# Patient Record
Sex: Male | Born: 1955 | Race: White | Hispanic: No | Smoking: Never smoker
Health system: Southern US, Community
[De-identification: ages and names within clinical notes are randomized; demographics above are authoritative.]

## PROBLEM LIST (undated history)

## (undated) DIAGNOSIS — I4892 Unspecified atrial flutter: Secondary | ICD-10-CM

## (undated) DIAGNOSIS — I4821 Permanent atrial fibrillation: Secondary | ICD-10-CM

## (undated) DIAGNOSIS — Z9889 Other specified postprocedural states: Secondary | ICD-10-CM

## (undated) DIAGNOSIS — I2510 Atherosclerotic heart disease of native coronary artery without angina pectoris: Secondary | ICD-10-CM

## (undated) DIAGNOSIS — R112 Nausea with vomiting, unspecified: Secondary | ICD-10-CM

## (undated) DIAGNOSIS — I5020 Unspecified systolic (congestive) heart failure: Secondary | ICD-10-CM

## (undated) DIAGNOSIS — E785 Hyperlipidemia, unspecified: Secondary | ICD-10-CM

## (undated) DIAGNOSIS — G4733 Obstructive sleep apnea (adult) (pediatric): Secondary | ICD-10-CM

## (undated) DIAGNOSIS — N186 End stage renal disease: Secondary | ICD-10-CM

## (undated) DIAGNOSIS — I10 Essential (primary) hypertension: Secondary | ICD-10-CM

## (undated) DIAGNOSIS — I255 Ischemic cardiomyopathy: Secondary | ICD-10-CM

## (undated) DIAGNOSIS — Z9989 Dependence on other enabling machines and devices: Secondary | ICD-10-CM

## (undated) DIAGNOSIS — E291 Testicular hypofunction: Secondary | ICD-10-CM

## (undated) DIAGNOSIS — R011 Cardiac murmur, unspecified: Secondary | ICD-10-CM

## (undated) DIAGNOSIS — N189 Chronic kidney disease, unspecified: Secondary | ICD-10-CM

## (undated) DIAGNOSIS — I499 Cardiac arrhythmia, unspecified: Secondary | ICD-10-CM

## (undated) DIAGNOSIS — Z7901 Long term (current) use of anticoagulants: Secondary | ICD-10-CM

## (undated) DIAGNOSIS — I739 Peripheral vascular disease, unspecified: Secondary | ICD-10-CM

## (undated) DIAGNOSIS — I4891 Unspecified atrial fibrillation: Secondary | ICD-10-CM

## (undated) DIAGNOSIS — I350 Nonrheumatic aortic (valve) stenosis: Secondary | ICD-10-CM

## (undated) DIAGNOSIS — D631 Anemia in chronic kidney disease: Secondary | ICD-10-CM

## (undated) DIAGNOSIS — F419 Anxiety disorder, unspecified: Secondary | ICD-10-CM

## (undated) DIAGNOSIS — C649 Malignant neoplasm of unspecified kidney, except renal pelvis: Secondary | ICD-10-CM

## (undated) DIAGNOSIS — Z9289 Personal history of other medical treatment: Secondary | ICD-10-CM

## (undated) DIAGNOSIS — I219 Acute myocardial infarction, unspecified: Secondary | ICD-10-CM

## (undated) DIAGNOSIS — N289 Disorder of kidney and ureter, unspecified: Secondary | ICD-10-CM

## (undated) DIAGNOSIS — T8859XA Other complications of anesthesia, initial encounter: Secondary | ICD-10-CM

## (undated) DIAGNOSIS — Z992 Dependence on renal dialysis: Secondary | ICD-10-CM

## (undated) DIAGNOSIS — M1990 Unspecified osteoarthritis, unspecified site: Secondary | ICD-10-CM

## (undated) HISTORY — DX: Anemia in chronic kidney disease: D63.1

## (undated) HISTORY — DX: Testicular hypofunction: E29.1

## (undated) HISTORY — DX: Nonrheumatic aortic (valve) stenosis: I35.0

## (undated) HISTORY — DX: Ischemic cardiomyopathy: I25.5

## (undated) HISTORY — DX: End stage renal disease: N18.6

## (undated) HISTORY — PX: INNER EAR SURGERY: SHX679

## (undated) HISTORY — DX: Chronic kidney disease, unspecified: N18.9

## (undated) HISTORY — DX: Essential (primary) hypertension: I10

## (undated) HISTORY — PX: CORONARY ANGIOPLASTY WITH STENT PLACEMENT: SHX49

## (undated) HISTORY — DX: Unspecified systolic (congestive) heart failure: I50.20

## (undated) HISTORY — DX: Malignant neoplasm of unspecified kidney, except renal pelvis: C64.9

## (undated) HISTORY — PX: HERNIA REPAIR: SHX51

## (undated) HISTORY — DX: Permanent atrial fibrillation: I48.21

## (undated) HISTORY — DX: Other specified postprocedural states: Z98.890

## (undated) HISTORY — PX: EYE SURGERY: SHX253

## (undated) HISTORY — DX: Atherosclerotic heart disease of native coronary artery without angina pectoris: I25.10

## (undated) HISTORY — DX: Dependence on renal dialysis: Z99.2

## (undated) HISTORY — DX: Cardiac murmur, unspecified: R01.1

## (undated) HISTORY — DX: Unspecified osteoarthritis, unspecified site: M19.90

## (undated) HISTORY — DX: Unspecified atrial fibrillation: I48.91

## (undated) HISTORY — PX: AORTIC VALVE REPLACEMENT: SHX41

## (undated) HISTORY — DX: Obstructive sleep apnea (adult) (pediatric): G47.33

## (undated) HISTORY — DX: Long term (current) use of anticoagulants: Z79.01

## (undated) HISTORY — DX: Hyperlipidemia, unspecified: E78.5

## (undated) HISTORY — DX: Dependence on other enabling machines and devices: Z99.89

## (undated) HISTORY — PX: KIDNEY TRANSPLANT: SHX239

## (undated) HISTORY — DX: Disorder of kidney and ureter, unspecified: N28.9

## (undated) HISTORY — PX: DG AV DIALYSIS  SHUNT ACCESS EXIST*L* OR: HXRAD910

## (undated) HISTORY — PX: BACK SURGERY: SHX140

---

## 2006-04-07 DIAGNOSIS — Z992 Dependence on renal dialysis: Secondary | ICD-10-CM

## 2006-04-07 HISTORY — DX: Dependence on renal dialysis: Z99.2

## 2008-01-06 ENCOUNTER — Ambulatory Visit (HOSPITAL_COMMUNITY): Admission: RE | Admit: 2008-01-06 | Discharge: 2008-01-06 | Payer: Self-pay | Admitting: Neurosurgery

## 2010-04-25 ENCOUNTER — Encounter: Payer: Self-pay | Admitting: Internal Medicine

## 2010-04-28 ENCOUNTER — Encounter: Payer: Self-pay | Admitting: Nephrology

## 2010-05-09 NOTE — Letter (Signed)
Summary: New Patient letter  [facility]  [address] [place], [place] [address]   Phone: [phone]  Fax: [phone]       04/25/2010 MRN: [id number]  [place], [place]  [address]  Dear [name],  Welcome to the Gastroenterology Division at [facility].    You are scheduled to see Dr.  Carlean Purl on 05-24-2010 at 10:30am on the 3rd floor at [facility], [place] [employer].  We ask that you try to arrive at our office 15 minutes prior to your appointment time to allow for check-in.  We would like you to complete the enclosed self-administered evaluation form prior to your visit and bring it with you on the day of your appointment.  We will review it with you.  Also, please bring a complete list of all your medications or, if you prefer, bring the medication bottles and we will list them.  Please bring your insurance card so that we may make a copy of it.  If your insurance requires a referral to see a specialist, please bring your referral form from your primary care physician.  Co-payments are due at the time of your visit and may be paid by cash, check or credit card.     Your office visit will consist of a consult with your physician (includes a physical exam), any laboratory testing he/she may order, scheduling of any necessary diagnostic testing (e.g. x-ray, ultrasound, CT-scan), and scheduling of a procedure (e.g. Endoscopy, Colonoscopy) if required.  Please allow enough time on your schedule to allow for any/all of these possibilities.    If you cannot keep your appointment, please call [phone] to cancel or reschedule prior to your appointment date.  This allows [name] the opportunity to schedule an appointment for another patient in need of care.  If you do not cancel or reschedule by 5 p.m. the business day prior to your appointment date, you will be charged a $50.00 late cancellation/no-show fee.    Thank you for choosing [facility] for your medical needs.  We appreciate the opportunity to care for you.  Please visit [name] at our website  to learn more about our practice.                     Sincerely,                                                             The Gastroenterology Division

## 2010-05-24 ENCOUNTER — Ambulatory Visit: Payer: Self-pay | Admitting: Internal Medicine

## 2010-08-20 NOTE — Op Note (Signed)
NAMEQUINTO, MAHLMAN                   ACCOUNT NO.:  [id number]   MEDICAL RECORD NO.:  [id number]          PATIENT TYPE:  AMB   LOCATION:  SDS                          FACILITY:  [facility]   PHYSICIAN:  Jefry H. Constance Holster, MD     DATE OF BIRTH:  [birth year]   DATE OF PROCEDURE:  DATE OF DISCHARGE:  01/06/2008                               OPERATIVE REPORT   PREOPERATIVE DIAGNOSIS:  Right tympanic membrane perforation.   POSTOPERATIVE DIAGNOSIS:  Right tympanic membrane perforation.   PROCEDURE:  Right medial graft tympanoplasty.   SURGEON:  Jefry H. Constance Holster, MD   ANESTHESIA:  General endotracheal anesthesia was used.   COMPLICATIONS:  None.   BLOOD LOSS:  Minimal.   FINDINGS:  50% central perforation right tympanic membrane with diffuse  severe tympanic sclerosis throughout the drum and the ossicular chain.  The ossicular chain was intact, but significantly stiffened by the  tympanic sclerosis plaques that were present.  The chorda tympani nerve  was intact, but elongated and it was preserved.  There was also  partially devitalized mastoid cortex bone that during the elevation of  the flap came up in pieces with the soft tissue and revealed the mastoid  cavity that appeared to be healthy and free of any cholesteatoma or  granulation tissue.   REFERRING PHYSICIAN:  [place].   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  He has undergone two attempted  tympanoplasties which both failed and these were several years back.  Risks, benefits, alternatives, and complications of the procedure were  explained to the patient.  He understands and agreed to surgery.   PROCEDURE:  The patient was taken to the operating room, placed on the  operating table in supine position.  Following induction of general  endotracheal anesthesia, the table was turned, and the patient was  draped in the standard fashion.  Xylocaine  with epinephrine was  infiltrated into the external auditory canal in four quadrants and the  postauricular sulcus along the scar from previous incision.  Radial  incisions were made in the canal at 4 o'clock and 8 o'clock with a  sickle knife and a vascular strip was created several millimeters  lateral to the annulus.  Vascular strip was elevated off the ear canal  bone.  The postauricular incision was created using electrocautery and  the ear was brought forward.  Temporalis fascia and scar tissue were  harvested, pressed and dried on the back table, cut the size and shape  with a notch for the malleus handle.  The linea temporalis was divided  and the mastoid periosteum was divided as the ear was brought forward.  The mastoid cortical bone defect was identified at this point.  This was  filled with Gelfoam soaked in separate exit the termination of the  procedure.  The perforation was prepared using sharp pick along the  edges to remove the epithelium and also to tease out the large plaques  of tympanic sclerotic tissue.  The drum was then  elevated forward with  the fibrous annulus to expose the middle ear.  Additional tympanic  sclerotic plaques were then teased out the epitympanum between the  malleus and the incus and stapes.  The middle ear was otherwise clear  and healthy.  The eustachian tube was packed with saline-soaked Gelfoam.  The middle ear was packed as well.  The graft was put into the medial  position and brought underneath the malleus handle.  Additional Gelfoam  support was used in the middle ear to secure the graft in good position  as the native drum was brought and the flap were brought back to their  normal position.  There was good positioning of the graft and the edges  of the perforation.  Ear canal was packed with Ciprodex-soaked Gelfoam.  Postauricular incision was reapproximated with subcuticular chromic  suture.  Dermabond was used on the skin.  The external  meatus was packed  with bacitracin and a cotton ball.  Mastoid dressing was applied.  The  patient was awakened, extubated, and transferred to recovery in stable  condition.      Jefry H. Constance Holster, MD  Electronically Signed     JHR/MEDQ  D:  01/06/2008  T:  01/07/2008  Job:  [id number]   cc:   [place]

## 2010-09-03 ENCOUNTER — Encounter: Payer: Self-pay | Admitting: Physician Assistant

## 2011-01-02 ENCOUNTER — Ambulatory Visit (INDEPENDENT_AMBULATORY_CARE_PROVIDER_SITE_OTHER): Payer: Medicare Other | Admitting: Internal Medicine

## 2011-01-02 ENCOUNTER — Encounter (INDEPENDENT_AMBULATORY_CARE_PROVIDER_SITE_OTHER): Payer: Self-pay | Admitting: Internal Medicine

## 2011-01-02 VITALS — BP 128/62 | HR 66 | Temp 98.6°F | Ht 62.5 in | Wt 193.1 lb

## 2011-01-02 DIAGNOSIS — R110 Nausea: Secondary | ICD-10-CM

## 2011-01-02 NOTE — Progress Notes (Signed)
Subjective:     Patient ID: Jeremy [name], male   DOB: Oct 07, 1955, 55 y.o.   MRN: [id number]  HPI  Jeremy [name] is a 55 yr old white presenting today with c/o that he has nausea which comes on all of a sudden.  He says he does not throw up.  Last episode was about a month ago.  Six months ago it was occuring about 2-3 times a week. He does not have abdominal pain.  His appetite is good. No weight loss.  This is not related to food intake.  It occurred before he ate.  No melena or bright red rectal bleeding. He was last seen in our office 07/17/2009 for diarrhea. He would occasionally have diarrhea after a meal. His symptoms would come and go.  He is a hemodialysis patient. Please see medicall hx.    Review of Systems  See hpi Current Outpatient Prescriptions  Medication Sig Dispense Refill  . calcium acetate (PHOSLO) 667 MG capsule Take 667 mg by mouth 3 (three) times daily with meals.        . cinacalcet (SENSIPAR) 30 MG tablet Take 30 mg by mouth daily.        . folic acid-vitamin b complex-vitamin c-selenium-zinc (DIALYVITE) 3 MG TABS Take 1 tablet by mouth daily.        Marland Kitchen labetalol (NORMODYNE) 300 MG tablet Take 300 mg by mouth 2 (two) times daily.        Marland Kitchen lanthanum (FOSRENOL) 500 MG chewable tablet Chew 500 mg by mouth 3 (three) times daily with meals.        . minoxidil (LONITEN) 2.5 MG tablet Take 2.5 mg by mouth daily.        Marland Kitchen NIFEdipine (PROCARDIA-XL/ADALAT CC) 60 MG 24 hr tablet Take 60 mg by mouth daily.        . pravastatin (PRAVACHOL) 40 MG tablet Take by mouth daily.        . predniSONE (DELTASONE) 5 MG tablet Take 5 mg by mouth daily.        . ramipril (ALTACE) 10 MG capsule Take 10 mg by mouth daily.        ' Past Surgical History  Procedure Date  . Dg av dialysis  shunt access exist*l* or     left arm  . Back surgery    No family history on file. Allergies  Allergen Reactions  . Lipitor (Atorvastatin Calcium)     Body aches        Objective:   Physical Exam  There were no  vitals filed for this visit.  .Alert and oriented. Skin warm and dry. Oral mucosa is moist.  . Sclera anicteric, conjunctivae is pink. Thyroid not enlarged. No cervical lymphadenopathy. Lungs clear. Heart regular rate and rhythm.  Abdomen is soft. Bowel sounds are positive. No hepatomegaly. No abdominal masses felt. No tenderness.  No edema to lower extremities. Patient is alert and oriented.      Assessment:    Nausea which has been occuring x 6 months. It is much better now and occurs about once a month.  ? Whether this is related to GERD.     Plan:    Will check an H. Pylori. No symptoms for over a month.  Will also give samples of Prilosec.   All questions answered. Patient is satisfied with his care.

## 2011-01-03 LAB — H. PYLORI ANTIBODY, IGG: H Pylori IgG: <0.4 {ISR}

## 2011-01-07 LAB — CBC
HCT: 43.8
Platelets: 162
RBC: 4.36
WBC: 6.3

## 2011-01-07 LAB — BASIC METABOLIC PANEL
BUN: 30 — ABNORMAL HIGH
Creatinine, Ser: 8.61 — ABNORMAL HIGH
GFR calc Af Amer: 8 — ABNORMAL LOW
GFR calc non Af Amer: 7 — ABNORMAL LOW
Potassium: 4.9

## 2011-04-09 DIAGNOSIS — D509 Iron deficiency anemia, unspecified: Secondary | ICD-10-CM | POA: Diagnosis not present

## 2011-04-09 DIAGNOSIS — N2581 Secondary hyperparathyroidism of renal origin: Secondary | ICD-10-CM | POA: Diagnosis not present

## 2011-04-09 DIAGNOSIS — N186 End stage renal disease: Secondary | ICD-10-CM | POA: Diagnosis not present

## 2011-04-09 DIAGNOSIS — D631 Anemia in chronic kidney disease: Secondary | ICD-10-CM | POA: Diagnosis not present

## 2011-04-15 DIAGNOSIS — C649 Malignant neoplasm of unspecified kidney, except renal pelvis: Secondary | ICD-10-CM | POA: Diagnosis not present

## 2011-04-15 DIAGNOSIS — IMO0002 Reserved for concepts with insufficient information to code with codable children: Secondary | ICD-10-CM | POA: Diagnosis not present

## 2011-04-15 DIAGNOSIS — M25559 Pain in unspecified hip: Secondary | ICD-10-CM | POA: Diagnosis not present

## 2011-04-15 DIAGNOSIS — Z79899 Other long term (current) drug therapy: Secondary | ICD-10-CM | POA: Diagnosis not present

## 2011-04-15 DIAGNOSIS — I2510 Atherosclerotic heart disease of native coronary artery without angina pectoris: Secondary | ICD-10-CM | POA: Diagnosis not present

## 2011-04-15 DIAGNOSIS — Z905 Acquired absence of kidney: Secondary | ICD-10-CM | POA: Diagnosis not present

## 2011-04-15 DIAGNOSIS — R5381 Other malaise: Secondary | ICD-10-CM | POA: Diagnosis not present

## 2011-04-15 DIAGNOSIS — K8020 Calculus of gallbladder without cholecystitis without obstruction: Secondary | ICD-10-CM | POA: Diagnosis not present

## 2011-05-08 DIAGNOSIS — N186 End stage renal disease: Secondary | ICD-10-CM | POA: Diagnosis not present

## 2011-05-09 DIAGNOSIS — N186 End stage renal disease: Secondary | ICD-10-CM | POA: Diagnosis not present

## 2011-05-09 DIAGNOSIS — N2581 Secondary hyperparathyroidism of renal origin: Secondary | ICD-10-CM | POA: Diagnosis not present

## 2011-05-09 DIAGNOSIS — D509 Iron deficiency anemia, unspecified: Secondary | ICD-10-CM | POA: Diagnosis not present

## 2011-05-09 DIAGNOSIS — D631 Anemia in chronic kidney disease: Secondary | ICD-10-CM | POA: Diagnosis not present

## 2011-05-19 DIAGNOSIS — I1311 Hypertensive heart and chronic kidney disease without heart failure, with stage 5 chronic kidney disease, or end stage renal disease: Secondary | ICD-10-CM | POA: Diagnosis present

## 2011-05-19 DIAGNOSIS — R079 Chest pain, unspecified: Secondary | ICD-10-CM | POA: Diagnosis not present

## 2011-05-19 DIAGNOSIS — Z8249 Family history of ischemic heart disease and other diseases of the circulatory system: Secondary | ICD-10-CM | POA: Diagnosis not present

## 2011-05-19 DIAGNOSIS — D509 Iron deficiency anemia, unspecified: Secondary | ICD-10-CM | POA: Diagnosis present

## 2011-05-19 DIAGNOSIS — I10 Essential (primary) hypertension: Secondary | ICD-10-CM | POA: Insufficient documentation

## 2011-05-19 DIAGNOSIS — I2510 Atherosclerotic heart disease of native coronary artery without angina pectoris: Secondary | ICD-10-CM | POA: Diagnosis present

## 2011-05-19 DIAGNOSIS — D638 Anemia in other chronic diseases classified elsewhere: Secondary | ICD-10-CM | POA: Diagnosis present

## 2011-05-19 DIAGNOSIS — Z833 Family history of diabetes mellitus: Secondary | ICD-10-CM | POA: Diagnosis not present

## 2011-05-19 DIAGNOSIS — Z992 Dependence on renal dialysis: Secondary | ICD-10-CM | POA: Diagnosis not present

## 2011-05-19 DIAGNOSIS — R0789 Other chest pain: Secondary | ICD-10-CM | POA: Diagnosis not present

## 2011-05-19 DIAGNOSIS — I200 Unstable angina: Secondary | ICD-10-CM | POA: Insufficient documentation

## 2011-05-19 DIAGNOSIS — Z86718 Personal history of other venous thrombosis and embolism: Secondary | ICD-10-CM | POA: Diagnosis not present

## 2011-05-19 DIAGNOSIS — I440 Atrioventricular block, first degree: Secondary | ICD-10-CM | POA: Diagnosis not present

## 2011-05-19 DIAGNOSIS — R0602 Shortness of breath: Secondary | ICD-10-CM | POA: Diagnosis not present

## 2011-05-19 DIAGNOSIS — Z9861 Coronary angioplasty status: Secondary | ICD-10-CM | POA: Diagnosis not present

## 2011-05-19 DIAGNOSIS — N186 End stage renal disease: Secondary | ICD-10-CM | POA: Diagnosis present

## 2011-05-19 DIAGNOSIS — I517 Cardiomegaly: Secondary | ICD-10-CM | POA: Diagnosis not present

## 2011-05-19 DIAGNOSIS — I359 Nonrheumatic aortic valve disorder, unspecified: Secondary | ICD-10-CM | POA: Diagnosis present

## 2011-05-19 DIAGNOSIS — IMO0002 Reserved for concepts with insufficient information to code with codable children: Secondary | ICD-10-CM | POA: Diagnosis not present

## 2011-05-20 DIAGNOSIS — N186 End stage renal disease: Secondary | ICD-10-CM | POA: Insufficient documentation

## 2011-05-26 DIAGNOSIS — T148XXA Other injury of unspecified body region, initial encounter: Secondary | ICD-10-CM | POA: Insufficient documentation

## 2011-05-26 DIAGNOSIS — Z7902 Long term (current) use of antithrombotics/antiplatelets: Secondary | ICD-10-CM | POA: Diagnosis not present

## 2011-05-26 DIAGNOSIS — N186 End stage renal disease: Secondary | ICD-10-CM | POA: Diagnosis not present

## 2011-05-26 DIAGNOSIS — Z940 Kidney transplant status: Secondary | ICD-10-CM | POA: Diagnosis not present

## 2011-05-26 DIAGNOSIS — I200 Unstable angina: Secondary | ICD-10-CM | POA: Diagnosis not present

## 2011-05-26 DIAGNOSIS — Z8249 Family history of ischemic heart disease and other diseases of the circulatory system: Secondary | ICD-10-CM | POA: Diagnosis not present

## 2011-05-26 DIAGNOSIS — S3011XA Contusion of abdominal wall, initial encounter: Secondary | ICD-10-CM | POA: Diagnosis not present

## 2011-05-26 DIAGNOSIS — Z833 Family history of diabetes mellitus: Secondary | ICD-10-CM | POA: Diagnosis not present

## 2011-05-26 DIAGNOSIS — I209 Angina pectoris, unspecified: Secondary | ICD-10-CM | POA: Diagnosis not present

## 2011-05-26 DIAGNOSIS — I2510 Atherosclerotic heart disease of native coronary artery without angina pectoris: Secondary | ICD-10-CM | POA: Diagnosis present

## 2011-05-26 DIAGNOSIS — Z9861 Coronary angioplasty status: Secondary | ICD-10-CM | POA: Diagnosis not present

## 2011-05-26 DIAGNOSIS — Z7982 Long term (current) use of aspirin: Secondary | ICD-10-CM | POA: Diagnosis not present

## 2011-05-26 DIAGNOSIS — S301XXA Contusion of abdominal wall, initial encounter: Secondary | ICD-10-CM | POA: Diagnosis not present

## 2011-05-26 DIAGNOSIS — Z905 Acquired absence of kidney: Secondary | ICD-10-CM | POA: Diagnosis not present

## 2011-05-26 DIAGNOSIS — IMO0002 Reserved for concepts with insufficient information to code with codable children: Secondary | ICD-10-CM | POA: Diagnosis present

## 2011-05-26 DIAGNOSIS — Z809 Family history of malignant neoplasm, unspecified: Secondary | ICD-10-CM | POA: Diagnosis not present

## 2011-05-26 DIAGNOSIS — I440 Atrioventricular block, first degree: Secondary | ICD-10-CM | POA: Diagnosis not present

## 2011-05-26 DIAGNOSIS — I10 Essential (primary) hypertension: Secondary | ICD-10-CM | POA: Diagnosis not present

## 2011-05-27 DIAGNOSIS — Z9861 Coronary angioplasty status: Secondary | ICD-10-CM | POA: Insufficient documentation

## 2011-06-05 DIAGNOSIS — N186 End stage renal disease: Secondary | ICD-10-CM | POA: Diagnosis not present

## 2011-06-05 DIAGNOSIS — N189 Chronic kidney disease, unspecified: Secondary | ICD-10-CM | POA: Diagnosis not present

## 2011-06-05 DIAGNOSIS — I2510 Atherosclerotic heart disease of native coronary artery without angina pectoris: Secondary | ICD-10-CM | POA: Diagnosis not present

## 2011-06-06 DIAGNOSIS — Z992 Dependence on renal dialysis: Secondary | ICD-10-CM | POA: Diagnosis not present

## 2011-06-06 DIAGNOSIS — D631 Anemia in chronic kidney disease: Secondary | ICD-10-CM | POA: Diagnosis not present

## 2011-06-06 DIAGNOSIS — N2581 Secondary hyperparathyroidism of renal origin: Secondary | ICD-10-CM | POA: Diagnosis not present

## 2011-06-06 DIAGNOSIS — N186 End stage renal disease: Secondary | ICD-10-CM | POA: Diagnosis not present

## 2011-06-06 DIAGNOSIS — D509 Iron deficiency anemia, unspecified: Secondary | ICD-10-CM | POA: Diagnosis not present

## 2011-06-11 DIAGNOSIS — E785 Hyperlipidemia, unspecified: Secondary | ICD-10-CM | POA: Diagnosis not present

## 2011-06-25 DIAGNOSIS — N186 End stage renal disease: Secondary | ICD-10-CM | POA: Diagnosis not present

## 2011-06-26 DIAGNOSIS — E041 Nontoxic single thyroid nodule: Secondary | ICD-10-CM | POA: Diagnosis not present

## 2011-07-03 DIAGNOSIS — I120 Hypertensive chronic kidney disease with stage 5 chronic kidney disease or end stage renal disease: Secondary | ICD-10-CM | POA: Diagnosis not present

## 2011-07-03 DIAGNOSIS — Z5189 Encounter for other specified aftercare: Secondary | ICD-10-CM | POA: Diagnosis not present

## 2011-07-03 DIAGNOSIS — I2510 Atherosclerotic heart disease of native coronary artery without angina pectoris: Secondary | ICD-10-CM | POA: Diagnosis not present

## 2011-07-03 DIAGNOSIS — Z9861 Coronary angioplasty status: Secondary | ICD-10-CM | POA: Diagnosis not present

## 2011-07-03 DIAGNOSIS — E785 Hyperlipidemia, unspecified: Secondary | ICD-10-CM | POA: Diagnosis not present

## 2011-07-03 DIAGNOSIS — N186 End stage renal disease: Secondary | ICD-10-CM | POA: Diagnosis not present

## 2011-07-06 DIAGNOSIS — N186 End stage renal disease: Secondary | ICD-10-CM | POA: Diagnosis not present

## 2011-07-07 DIAGNOSIS — N186 End stage renal disease: Secondary | ICD-10-CM | POA: Diagnosis not present

## 2011-07-07 DIAGNOSIS — N2581 Secondary hyperparathyroidism of renal origin: Secondary | ICD-10-CM | POA: Diagnosis not present

## 2011-07-07 DIAGNOSIS — D631 Anemia in chronic kidney disease: Secondary | ICD-10-CM | POA: Diagnosis not present

## 2011-07-07 DIAGNOSIS — R110 Nausea: Secondary | ICD-10-CM | POA: Diagnosis not present

## 2011-07-07 DIAGNOSIS — D509 Iron deficiency anemia, unspecified: Secondary | ICD-10-CM | POA: Diagnosis not present

## 2011-07-08 DIAGNOSIS — E785 Hyperlipidemia, unspecified: Secondary | ICD-10-CM | POA: Diagnosis not present

## 2011-07-08 DIAGNOSIS — Z9861 Coronary angioplasty status: Secondary | ICD-10-CM | POA: Diagnosis not present

## 2011-07-08 DIAGNOSIS — N186 End stage renal disease: Secondary | ICD-10-CM | POA: Diagnosis not present

## 2011-07-08 DIAGNOSIS — Z5189 Encounter for other specified aftercare: Secondary | ICD-10-CM | POA: Diagnosis not present

## 2011-07-08 DIAGNOSIS — I120 Hypertensive chronic kidney disease with stage 5 chronic kidney disease or end stage renal disease: Secondary | ICD-10-CM | POA: Diagnosis not present

## 2011-07-08 DIAGNOSIS — I2510 Atherosclerotic heart disease of native coronary artery without angina pectoris: Secondary | ICD-10-CM | POA: Diagnosis not present

## 2011-08-05 DIAGNOSIS — N186 End stage renal disease: Secondary | ICD-10-CM | POA: Diagnosis not present

## 2011-08-06 DIAGNOSIS — N186 End stage renal disease: Secondary | ICD-10-CM | POA: Diagnosis not present

## 2011-08-06 DIAGNOSIS — N2581 Secondary hyperparathyroidism of renal origin: Secondary | ICD-10-CM | POA: Diagnosis not present

## 2011-08-06 DIAGNOSIS — N039 Chronic nephritic syndrome with unspecified morphologic changes: Secondary | ICD-10-CM | POA: Diagnosis not present

## 2011-08-06 DIAGNOSIS — D509 Iron deficiency anemia, unspecified: Secondary | ICD-10-CM | POA: Diagnosis not present

## 2011-08-07 DIAGNOSIS — I2510 Atherosclerotic heart disease of native coronary artery without angina pectoris: Secondary | ICD-10-CM | POA: Diagnosis not present

## 2011-08-07 DIAGNOSIS — E785 Hyperlipidemia, unspecified: Secondary | ICD-10-CM | POA: Diagnosis not present

## 2011-08-07 DIAGNOSIS — I120 Hypertensive chronic kidney disease with stage 5 chronic kidney disease or end stage renal disease: Secondary | ICD-10-CM | POA: Diagnosis not present

## 2011-08-07 DIAGNOSIS — N186 End stage renal disease: Secondary | ICD-10-CM | POA: Diagnosis not present

## 2011-08-07 DIAGNOSIS — Z9861 Coronary angioplasty status: Secondary | ICD-10-CM | POA: Diagnosis not present

## 2011-08-07 DIAGNOSIS — Z5189 Encounter for other specified aftercare: Secondary | ICD-10-CM | POA: Diagnosis not present

## 2011-08-08 DIAGNOSIS — N2581 Secondary hyperparathyroidism of renal origin: Secondary | ICD-10-CM | POA: Diagnosis not present

## 2011-08-08 DIAGNOSIS — N039 Chronic nephritic syndrome with unspecified morphologic changes: Secondary | ICD-10-CM | POA: Diagnosis not present

## 2011-08-08 DIAGNOSIS — N186 End stage renal disease: Secondary | ICD-10-CM | POA: Diagnosis not present

## 2011-08-08 DIAGNOSIS — D509 Iron deficiency anemia, unspecified: Secondary | ICD-10-CM | POA: Diagnosis not present

## 2011-08-11 DIAGNOSIS — N2581 Secondary hyperparathyroidism of renal origin: Secondary | ICD-10-CM | POA: Diagnosis not present

## 2011-08-11 DIAGNOSIS — D631 Anemia in chronic kidney disease: Secondary | ICD-10-CM | POA: Diagnosis not present

## 2011-08-11 DIAGNOSIS — N186 End stage renal disease: Secondary | ICD-10-CM | POA: Diagnosis not present

## 2011-08-11 DIAGNOSIS — D509 Iron deficiency anemia, unspecified: Secondary | ICD-10-CM | POA: Diagnosis not present

## 2011-08-13 ENCOUNTER — Telehealth (INDEPENDENT_AMBULATORY_CARE_PROVIDER_SITE_OTHER): Payer: Self-pay | Admitting: *Deleted

## 2011-08-13 DIAGNOSIS — N2581 Secondary hyperparathyroidism of renal origin: Secondary | ICD-10-CM | POA: Diagnosis not present

## 2011-08-13 DIAGNOSIS — N039 Chronic nephritic syndrome with unspecified morphologic changes: Secondary | ICD-10-CM | POA: Diagnosis not present

## 2011-08-13 DIAGNOSIS — D509 Iron deficiency anemia, unspecified: Secondary | ICD-10-CM | POA: Diagnosis not present

## 2011-08-13 DIAGNOSIS — N186 End stage renal disease: Secondary | ICD-10-CM | POA: Diagnosis not present

## 2011-08-13 MED ORDER — DICYCLOMINE HCL 10 MG PO CAPS: 10.0000 mg | ORAL_CAPSULE | Freq: Three times a day (TID) | ORAL | Status: DC

## 2011-08-13 NOTE — Telephone Encounter (Signed)
Jeremy [name] has requested a refill on  Dicyclomine 10 mg capsule, take 1 capsule by mouth twice daily.

## 2011-08-14 DIAGNOSIS — Z9861 Coronary angioplasty status: Secondary | ICD-10-CM | POA: Diagnosis not present

## 2011-08-14 DIAGNOSIS — I2510 Atherosclerotic heart disease of native coronary artery without angina pectoris: Secondary | ICD-10-CM | POA: Diagnosis not present

## 2011-08-14 DIAGNOSIS — N186 End stage renal disease: Secondary | ICD-10-CM | POA: Diagnosis not present

## 2011-08-14 DIAGNOSIS — E785 Hyperlipidemia, unspecified: Secondary | ICD-10-CM | POA: Diagnosis not present

## 2011-08-14 DIAGNOSIS — I120 Hypertensive chronic kidney disease with stage 5 chronic kidney disease or end stage renal disease: Secondary | ICD-10-CM | POA: Diagnosis not present

## 2011-08-14 DIAGNOSIS — Z5189 Encounter for other specified aftercare: Secondary | ICD-10-CM | POA: Diagnosis not present

## 2011-08-15 DIAGNOSIS — D509 Iron deficiency anemia, unspecified: Secondary | ICD-10-CM | POA: Diagnosis not present

## 2011-08-15 DIAGNOSIS — N2581 Secondary hyperparathyroidism of renal origin: Secondary | ICD-10-CM | POA: Diagnosis not present

## 2011-08-15 DIAGNOSIS — N186 End stage renal disease: Secondary | ICD-10-CM | POA: Diagnosis not present

## 2011-08-15 DIAGNOSIS — D631 Anemia in chronic kidney disease: Secondary | ICD-10-CM | POA: Diagnosis not present

## 2011-08-18 DIAGNOSIS — D509 Iron deficiency anemia, unspecified: Secondary | ICD-10-CM | POA: Diagnosis not present

## 2011-08-18 DIAGNOSIS — N039 Chronic nephritic syndrome with unspecified morphologic changes: Secondary | ICD-10-CM | POA: Diagnosis not present

## 2011-08-18 DIAGNOSIS — N186 End stage renal disease: Secondary | ICD-10-CM | POA: Diagnosis not present

## 2011-08-18 DIAGNOSIS — N2581 Secondary hyperparathyroidism of renal origin: Secondary | ICD-10-CM | POA: Diagnosis not present

## 2011-08-19 DIAGNOSIS — N186 End stage renal disease: Secondary | ICD-10-CM | POA: Diagnosis not present

## 2011-08-19 DIAGNOSIS — I120 Hypertensive chronic kidney disease with stage 5 chronic kidney disease or end stage renal disease: Secondary | ICD-10-CM | POA: Diagnosis not present

## 2011-08-19 DIAGNOSIS — Z9861 Coronary angioplasty status: Secondary | ICD-10-CM | POA: Diagnosis not present

## 2011-08-19 DIAGNOSIS — Z5189 Encounter for other specified aftercare: Secondary | ICD-10-CM | POA: Diagnosis not present

## 2011-08-19 DIAGNOSIS — E785 Hyperlipidemia, unspecified: Secondary | ICD-10-CM | POA: Diagnosis not present

## 2011-08-19 DIAGNOSIS — I2510 Atherosclerotic heart disease of native coronary artery without angina pectoris: Secondary | ICD-10-CM | POA: Diagnosis not present

## 2011-08-20 DIAGNOSIS — N2581 Secondary hyperparathyroidism of renal origin: Secondary | ICD-10-CM | POA: Diagnosis not present

## 2011-08-20 DIAGNOSIS — N186 End stage renal disease: Secondary | ICD-10-CM | POA: Diagnosis not present

## 2011-08-20 DIAGNOSIS — D509 Iron deficiency anemia, unspecified: Secondary | ICD-10-CM | POA: Diagnosis not present

## 2011-08-20 DIAGNOSIS — D631 Anemia in chronic kidney disease: Secondary | ICD-10-CM | POA: Diagnosis not present

## 2011-08-21 DIAGNOSIS — Z5189 Encounter for other specified aftercare: Secondary | ICD-10-CM | POA: Diagnosis not present

## 2011-08-21 DIAGNOSIS — I120 Hypertensive chronic kidney disease with stage 5 chronic kidney disease or end stage renal disease: Secondary | ICD-10-CM | POA: Diagnosis not present

## 2011-08-21 DIAGNOSIS — I2510 Atherosclerotic heart disease of native coronary artery without angina pectoris: Secondary | ICD-10-CM | POA: Diagnosis not present

## 2011-08-21 DIAGNOSIS — E785 Hyperlipidemia, unspecified: Secondary | ICD-10-CM | POA: Diagnosis not present

## 2011-08-21 DIAGNOSIS — Z9861 Coronary angioplasty status: Secondary | ICD-10-CM | POA: Diagnosis not present

## 2011-08-21 DIAGNOSIS — N186 End stage renal disease: Secondary | ICD-10-CM | POA: Diagnosis not present

## 2011-08-22 DIAGNOSIS — D631 Anemia in chronic kidney disease: Secondary | ICD-10-CM | POA: Diagnosis not present

## 2011-08-22 DIAGNOSIS — N2581 Secondary hyperparathyroidism of renal origin: Secondary | ICD-10-CM | POA: Diagnosis not present

## 2011-08-22 DIAGNOSIS — D509 Iron deficiency anemia, unspecified: Secondary | ICD-10-CM | POA: Diagnosis not present

## 2011-08-22 DIAGNOSIS — N186 End stage renal disease: Secondary | ICD-10-CM | POA: Diagnosis not present

## 2011-08-25 DIAGNOSIS — N186 End stage renal disease: Secondary | ICD-10-CM | POA: Diagnosis not present

## 2011-08-25 DIAGNOSIS — N039 Chronic nephritic syndrome with unspecified morphologic changes: Secondary | ICD-10-CM | POA: Diagnosis not present

## 2011-08-25 DIAGNOSIS — N2581 Secondary hyperparathyroidism of renal origin: Secondary | ICD-10-CM | POA: Diagnosis not present

## 2011-08-25 DIAGNOSIS — D509 Iron deficiency anemia, unspecified: Secondary | ICD-10-CM | POA: Diagnosis not present

## 2011-08-26 DIAGNOSIS — I2510 Atherosclerotic heart disease of native coronary artery without angina pectoris: Secondary | ICD-10-CM | POA: Diagnosis not present

## 2011-08-26 DIAGNOSIS — Z9861 Coronary angioplasty status: Secondary | ICD-10-CM | POA: Diagnosis not present

## 2011-08-26 DIAGNOSIS — Z5189 Encounter for other specified aftercare: Secondary | ICD-10-CM | POA: Diagnosis not present

## 2011-08-26 DIAGNOSIS — I120 Hypertensive chronic kidney disease with stage 5 chronic kidney disease or end stage renal disease: Secondary | ICD-10-CM | POA: Diagnosis not present

## 2011-08-26 DIAGNOSIS — N186 End stage renal disease: Secondary | ICD-10-CM | POA: Diagnosis not present

## 2011-08-26 DIAGNOSIS — E785 Hyperlipidemia, unspecified: Secondary | ICD-10-CM | POA: Diagnosis not present

## 2011-08-27 DIAGNOSIS — D509 Iron deficiency anemia, unspecified: Secondary | ICD-10-CM | POA: Diagnosis not present

## 2011-08-27 DIAGNOSIS — D631 Anemia in chronic kidney disease: Secondary | ICD-10-CM | POA: Diagnosis not present

## 2011-08-27 DIAGNOSIS — N186 End stage renal disease: Secondary | ICD-10-CM | POA: Diagnosis not present

## 2011-08-27 DIAGNOSIS — N2581 Secondary hyperparathyroidism of renal origin: Secondary | ICD-10-CM | POA: Diagnosis not present

## 2011-08-28 DIAGNOSIS — N186 End stage renal disease: Secondary | ICD-10-CM | POA: Diagnosis not present

## 2011-08-28 DIAGNOSIS — E785 Hyperlipidemia, unspecified: Secondary | ICD-10-CM | POA: Diagnosis not present

## 2011-08-28 DIAGNOSIS — I120 Hypertensive chronic kidney disease with stage 5 chronic kidney disease or end stage renal disease: Secondary | ICD-10-CM | POA: Diagnosis not present

## 2011-08-28 DIAGNOSIS — I2510 Atherosclerotic heart disease of native coronary artery without angina pectoris: Secondary | ICD-10-CM | POA: Diagnosis not present

## 2011-08-28 DIAGNOSIS — Z9861 Coronary angioplasty status: Secondary | ICD-10-CM | POA: Diagnosis not present

## 2011-08-28 DIAGNOSIS — Z5189 Encounter for other specified aftercare: Secondary | ICD-10-CM | POA: Diagnosis not present

## 2011-08-29 DIAGNOSIS — N186 End stage renal disease: Secondary | ICD-10-CM | POA: Diagnosis not present

## 2011-08-29 DIAGNOSIS — N2581 Secondary hyperparathyroidism of renal origin: Secondary | ICD-10-CM | POA: Diagnosis not present

## 2011-08-29 DIAGNOSIS — D509 Iron deficiency anemia, unspecified: Secondary | ICD-10-CM | POA: Diagnosis not present

## 2011-08-29 DIAGNOSIS — N039 Chronic nephritic syndrome with unspecified morphologic changes: Secondary | ICD-10-CM | POA: Diagnosis not present

## 2011-09-01 DIAGNOSIS — N2581 Secondary hyperparathyroidism of renal origin: Secondary | ICD-10-CM | POA: Diagnosis not present

## 2011-09-01 DIAGNOSIS — N039 Chronic nephritic syndrome with unspecified morphologic changes: Secondary | ICD-10-CM | POA: Diagnosis not present

## 2011-09-01 DIAGNOSIS — D509 Iron deficiency anemia, unspecified: Secondary | ICD-10-CM | POA: Diagnosis not present

## 2011-09-01 DIAGNOSIS — N186 End stage renal disease: Secondary | ICD-10-CM | POA: Diagnosis not present

## 2011-09-02 DIAGNOSIS — E785 Hyperlipidemia, unspecified: Secondary | ICD-10-CM | POA: Diagnosis not present

## 2011-09-02 DIAGNOSIS — Z9861 Coronary angioplasty status: Secondary | ICD-10-CM | POA: Diagnosis not present

## 2011-09-02 DIAGNOSIS — N186 End stage renal disease: Secondary | ICD-10-CM | POA: Diagnosis not present

## 2011-09-02 DIAGNOSIS — Z5189 Encounter for other specified aftercare: Secondary | ICD-10-CM | POA: Diagnosis not present

## 2011-09-02 DIAGNOSIS — I2510 Atherosclerotic heart disease of native coronary artery without angina pectoris: Secondary | ICD-10-CM | POA: Diagnosis not present

## 2011-09-02 DIAGNOSIS — I120 Hypertensive chronic kidney disease with stage 5 chronic kidney disease or end stage renal disease: Secondary | ICD-10-CM | POA: Diagnosis not present

## 2011-09-03 DIAGNOSIS — D631 Anemia in chronic kidney disease: Secondary | ICD-10-CM | POA: Diagnosis not present

## 2011-09-03 DIAGNOSIS — N186 End stage renal disease: Secondary | ICD-10-CM | POA: Diagnosis not present

## 2011-09-03 DIAGNOSIS — D509 Iron deficiency anemia, unspecified: Secondary | ICD-10-CM | POA: Diagnosis not present

## 2011-09-03 DIAGNOSIS — N2581 Secondary hyperparathyroidism of renal origin: Secondary | ICD-10-CM | POA: Diagnosis not present

## 2011-09-04 DIAGNOSIS — E785 Hyperlipidemia, unspecified: Secondary | ICD-10-CM | POA: Diagnosis not present

## 2011-09-04 DIAGNOSIS — N189 Chronic kidney disease, unspecified: Secondary | ICD-10-CM | POA: Diagnosis not present

## 2011-09-04 DIAGNOSIS — Z125 Encounter for screening for malignant neoplasm of prostate: Secondary | ICD-10-CM | POA: Diagnosis not present

## 2011-09-04 DIAGNOSIS — E559 Vitamin D deficiency, unspecified: Secondary | ICD-10-CM | POA: Diagnosis not present

## 2011-09-04 DIAGNOSIS — I10 Essential (primary) hypertension: Secondary | ICD-10-CM | POA: Diagnosis not present

## 2011-09-04 DIAGNOSIS — E039 Hypothyroidism, unspecified: Secondary | ICD-10-CM | POA: Diagnosis not present

## 2011-09-05 DIAGNOSIS — M545 Low back pain, unspecified: Secondary | ICD-10-CM | POA: Diagnosis not present

## 2011-09-05 DIAGNOSIS — N2581 Secondary hyperparathyroidism of renal origin: Secondary | ICD-10-CM | POA: Diagnosis not present

## 2011-09-05 DIAGNOSIS — M961 Postlaminectomy syndrome, not elsewhere classified: Secondary | ICD-10-CM | POA: Diagnosis not present

## 2011-09-05 DIAGNOSIS — N186 End stage renal disease: Secondary | ICD-10-CM | POA: Diagnosis not present

## 2011-09-05 DIAGNOSIS — D509 Iron deficiency anemia, unspecified: Secondary | ICD-10-CM | POA: Diagnosis not present

## 2011-09-05 DIAGNOSIS — N039 Chronic nephritic syndrome with unspecified morphologic changes: Secondary | ICD-10-CM | POA: Diagnosis not present

## 2011-09-08 DIAGNOSIS — D509 Iron deficiency anemia, unspecified: Secondary | ICD-10-CM | POA: Diagnosis not present

## 2011-09-08 DIAGNOSIS — N2581 Secondary hyperparathyroidism of renal origin: Secondary | ICD-10-CM | POA: Diagnosis not present

## 2011-09-08 DIAGNOSIS — N039 Chronic nephritic syndrome with unspecified morphologic changes: Secondary | ICD-10-CM | POA: Diagnosis not present

## 2011-09-08 DIAGNOSIS — N186 End stage renal disease: Secondary | ICD-10-CM | POA: Diagnosis not present

## 2011-09-08 DIAGNOSIS — D631 Anemia in chronic kidney disease: Secondary | ICD-10-CM | POA: Diagnosis not present

## 2011-09-09 DIAGNOSIS — I120 Hypertensive chronic kidney disease with stage 5 chronic kidney disease or end stage renal disease: Secondary | ICD-10-CM | POA: Diagnosis not present

## 2011-09-09 DIAGNOSIS — Z9861 Coronary angioplasty status: Secondary | ICD-10-CM | POA: Diagnosis not present

## 2011-09-09 DIAGNOSIS — E785 Hyperlipidemia, unspecified: Secondary | ICD-10-CM | POA: Diagnosis not present

## 2011-09-09 DIAGNOSIS — I2510 Atherosclerotic heart disease of native coronary artery without angina pectoris: Secondary | ICD-10-CM | POA: Diagnosis not present

## 2011-09-09 DIAGNOSIS — Z5189 Encounter for other specified aftercare: Secondary | ICD-10-CM | POA: Diagnosis not present

## 2011-09-09 DIAGNOSIS — N186 End stage renal disease: Secondary | ICD-10-CM | POA: Diagnosis not present

## 2011-09-10 DIAGNOSIS — N2581 Secondary hyperparathyroidism of renal origin: Secondary | ICD-10-CM | POA: Diagnosis not present

## 2011-09-10 DIAGNOSIS — D631 Anemia in chronic kidney disease: Secondary | ICD-10-CM | POA: Diagnosis not present

## 2011-09-10 DIAGNOSIS — N186 End stage renal disease: Secondary | ICD-10-CM | POA: Diagnosis not present

## 2011-09-10 DIAGNOSIS — D509 Iron deficiency anemia, unspecified: Secondary | ICD-10-CM | POA: Diagnosis not present

## 2011-09-11 DIAGNOSIS — Z9861 Coronary angioplasty status: Secondary | ICD-10-CM | POA: Diagnosis not present

## 2011-09-11 DIAGNOSIS — N186 End stage renal disease: Secondary | ICD-10-CM | POA: Diagnosis not present

## 2011-09-11 DIAGNOSIS — I2510 Atherosclerotic heart disease of native coronary artery without angina pectoris: Secondary | ICD-10-CM | POA: Diagnosis not present

## 2011-09-11 DIAGNOSIS — I120 Hypertensive chronic kidney disease with stage 5 chronic kidney disease or end stage renal disease: Secondary | ICD-10-CM | POA: Diagnosis not present

## 2011-09-11 DIAGNOSIS — Z5189 Encounter for other specified aftercare: Secondary | ICD-10-CM | POA: Diagnosis not present

## 2011-09-11 DIAGNOSIS — E785 Hyperlipidemia, unspecified: Secondary | ICD-10-CM | POA: Diagnosis not present

## 2011-09-12 DIAGNOSIS — D631 Anemia in chronic kidney disease: Secondary | ICD-10-CM | POA: Diagnosis not present

## 2011-09-12 DIAGNOSIS — N186 End stage renal disease: Secondary | ICD-10-CM | POA: Diagnosis not present

## 2011-09-12 DIAGNOSIS — N2581 Secondary hyperparathyroidism of renal origin: Secondary | ICD-10-CM | POA: Diagnosis not present

## 2011-09-12 DIAGNOSIS — D509 Iron deficiency anemia, unspecified: Secondary | ICD-10-CM | POA: Diagnosis not present

## 2011-09-15 DIAGNOSIS — D509 Iron deficiency anemia, unspecified: Secondary | ICD-10-CM | POA: Diagnosis not present

## 2011-09-15 DIAGNOSIS — N186 End stage renal disease: Secondary | ICD-10-CM | POA: Diagnosis not present

## 2011-09-15 DIAGNOSIS — N039 Chronic nephritic syndrome with unspecified morphologic changes: Secondary | ICD-10-CM | POA: Diagnosis not present

## 2011-09-15 DIAGNOSIS — N2581 Secondary hyperparathyroidism of renal origin: Secondary | ICD-10-CM | POA: Diagnosis not present

## 2011-09-16 DIAGNOSIS — E785 Hyperlipidemia, unspecified: Secondary | ICD-10-CM | POA: Diagnosis not present

## 2011-09-16 DIAGNOSIS — N186 End stage renal disease: Secondary | ICD-10-CM | POA: Diagnosis not present

## 2011-09-16 DIAGNOSIS — Z9861 Coronary angioplasty status: Secondary | ICD-10-CM | POA: Diagnosis not present

## 2011-09-16 DIAGNOSIS — Z5189 Encounter for other specified aftercare: Secondary | ICD-10-CM | POA: Diagnosis not present

## 2011-09-16 DIAGNOSIS — I120 Hypertensive chronic kidney disease with stage 5 chronic kidney disease or end stage renal disease: Secondary | ICD-10-CM | POA: Diagnosis not present

## 2011-09-16 DIAGNOSIS — I2510 Atherosclerotic heart disease of native coronary artery without angina pectoris: Secondary | ICD-10-CM | POA: Diagnosis not present

## 2011-09-17 DIAGNOSIS — N186 End stage renal disease: Secondary | ICD-10-CM | POA: Diagnosis not present

## 2011-09-17 DIAGNOSIS — D509 Iron deficiency anemia, unspecified: Secondary | ICD-10-CM | POA: Diagnosis not present

## 2011-09-17 DIAGNOSIS — N2581 Secondary hyperparathyroidism of renal origin: Secondary | ICD-10-CM | POA: Diagnosis not present

## 2011-09-17 DIAGNOSIS — N039 Chronic nephritic syndrome with unspecified morphologic changes: Secondary | ICD-10-CM | POA: Diagnosis not present

## 2011-09-18 DIAGNOSIS — I2510 Atherosclerotic heart disease of native coronary artery without angina pectoris: Secondary | ICD-10-CM | POA: Diagnosis not present

## 2011-09-18 DIAGNOSIS — I120 Hypertensive chronic kidney disease with stage 5 chronic kidney disease or end stage renal disease: Secondary | ICD-10-CM | POA: Diagnosis not present

## 2011-09-18 DIAGNOSIS — Z5189 Encounter for other specified aftercare: Secondary | ICD-10-CM | POA: Diagnosis not present

## 2011-09-18 DIAGNOSIS — Z9861 Coronary angioplasty status: Secondary | ICD-10-CM | POA: Diagnosis not present

## 2011-09-18 DIAGNOSIS — E785 Hyperlipidemia, unspecified: Secondary | ICD-10-CM | POA: Diagnosis not present

## 2011-09-18 DIAGNOSIS — N186 End stage renal disease: Secondary | ICD-10-CM | POA: Diagnosis not present

## 2011-09-19 DIAGNOSIS — N2581 Secondary hyperparathyroidism of renal origin: Secondary | ICD-10-CM | POA: Diagnosis not present

## 2011-09-19 DIAGNOSIS — D509 Iron deficiency anemia, unspecified: Secondary | ICD-10-CM | POA: Diagnosis not present

## 2011-09-19 DIAGNOSIS — D631 Anemia in chronic kidney disease: Secondary | ICD-10-CM | POA: Diagnosis not present

## 2011-09-19 DIAGNOSIS — N186 End stage renal disease: Secondary | ICD-10-CM | POA: Diagnosis not present

## 2011-09-22 DIAGNOSIS — D509 Iron deficiency anemia, unspecified: Secondary | ICD-10-CM | POA: Diagnosis not present

## 2011-09-22 DIAGNOSIS — N039 Chronic nephritic syndrome with unspecified morphologic changes: Secondary | ICD-10-CM | POA: Diagnosis not present

## 2011-09-22 DIAGNOSIS — N186 End stage renal disease: Secondary | ICD-10-CM | POA: Diagnosis not present

## 2011-09-22 DIAGNOSIS — N2581 Secondary hyperparathyroidism of renal origin: Secondary | ICD-10-CM | POA: Diagnosis not present

## 2011-09-24 DIAGNOSIS — N186 End stage renal disease: Secondary | ICD-10-CM | POA: Diagnosis not present

## 2011-09-24 DIAGNOSIS — N2581 Secondary hyperparathyroidism of renal origin: Secondary | ICD-10-CM | POA: Diagnosis not present

## 2011-09-24 DIAGNOSIS — D509 Iron deficiency anemia, unspecified: Secondary | ICD-10-CM | POA: Diagnosis not present

## 2011-09-24 DIAGNOSIS — D631 Anemia in chronic kidney disease: Secondary | ICD-10-CM | POA: Diagnosis not present

## 2011-09-25 DIAGNOSIS — N186 End stage renal disease: Secondary | ICD-10-CM | POA: Diagnosis not present

## 2011-09-25 DIAGNOSIS — Z5189 Encounter for other specified aftercare: Secondary | ICD-10-CM | POA: Diagnosis not present

## 2011-09-25 DIAGNOSIS — Z9861 Coronary angioplasty status: Secondary | ICD-10-CM | POA: Diagnosis not present

## 2011-09-25 DIAGNOSIS — E785 Hyperlipidemia, unspecified: Secondary | ICD-10-CM | POA: Diagnosis not present

## 2011-09-25 DIAGNOSIS — I2510 Atherosclerotic heart disease of native coronary artery without angina pectoris: Secondary | ICD-10-CM | POA: Diagnosis not present

## 2011-09-25 DIAGNOSIS — I120 Hypertensive chronic kidney disease with stage 5 chronic kidney disease or end stage renal disease: Secondary | ICD-10-CM | POA: Diagnosis not present

## 2011-09-26 DIAGNOSIS — N2581 Secondary hyperparathyroidism of renal origin: Secondary | ICD-10-CM | POA: Diagnosis not present

## 2011-09-26 DIAGNOSIS — D509 Iron deficiency anemia, unspecified: Secondary | ICD-10-CM | POA: Diagnosis not present

## 2011-09-26 DIAGNOSIS — N039 Chronic nephritic syndrome with unspecified morphologic changes: Secondary | ICD-10-CM | POA: Diagnosis not present

## 2011-09-26 DIAGNOSIS — N186 End stage renal disease: Secondary | ICD-10-CM | POA: Diagnosis not present

## 2011-09-29 DIAGNOSIS — D509 Iron deficiency anemia, unspecified: Secondary | ICD-10-CM | POA: Diagnosis not present

## 2011-09-29 DIAGNOSIS — N2581 Secondary hyperparathyroidism of renal origin: Secondary | ICD-10-CM | POA: Diagnosis not present

## 2011-09-29 DIAGNOSIS — N186 End stage renal disease: Secondary | ICD-10-CM | POA: Diagnosis not present

## 2011-09-29 DIAGNOSIS — N039 Chronic nephritic syndrome with unspecified morphologic changes: Secondary | ICD-10-CM | POA: Diagnosis not present

## 2011-09-30 DIAGNOSIS — L821 Other seborrheic keratosis: Secondary | ICD-10-CM | POA: Diagnosis not present

## 2011-09-30 DIAGNOSIS — D485 Neoplasm of uncertain behavior of skin: Secondary | ICD-10-CM | POA: Diagnosis not present

## 2011-09-30 DIAGNOSIS — L723 Sebaceous cyst: Secondary | ICD-10-CM | POA: Diagnosis not present

## 2011-09-30 DIAGNOSIS — L570 Actinic keratosis: Secondary | ICD-10-CM | POA: Diagnosis not present

## 2011-10-01 DIAGNOSIS — N186 End stage renal disease: Secondary | ICD-10-CM | POA: Diagnosis not present

## 2011-10-01 DIAGNOSIS — D631 Anemia in chronic kidney disease: Secondary | ICD-10-CM | POA: Diagnosis not present

## 2011-10-01 DIAGNOSIS — D509 Iron deficiency anemia, unspecified: Secondary | ICD-10-CM | POA: Diagnosis not present

## 2011-10-01 DIAGNOSIS — N2581 Secondary hyperparathyroidism of renal origin: Secondary | ICD-10-CM | POA: Diagnosis not present

## 2011-10-02 DIAGNOSIS — N186 End stage renal disease: Secondary | ICD-10-CM | POA: Diagnosis not present

## 2011-10-02 DIAGNOSIS — I2510 Atherosclerotic heart disease of native coronary artery without angina pectoris: Secondary | ICD-10-CM | POA: Diagnosis not present

## 2011-10-02 DIAGNOSIS — I120 Hypertensive chronic kidney disease with stage 5 chronic kidney disease or end stage renal disease: Secondary | ICD-10-CM | POA: Diagnosis not present

## 2011-10-02 DIAGNOSIS — Z5189 Encounter for other specified aftercare: Secondary | ICD-10-CM | POA: Diagnosis not present

## 2011-10-02 DIAGNOSIS — Z9861 Coronary angioplasty status: Secondary | ICD-10-CM | POA: Diagnosis not present

## 2011-10-02 DIAGNOSIS — E785 Hyperlipidemia, unspecified: Secondary | ICD-10-CM | POA: Diagnosis not present

## 2011-10-03 DIAGNOSIS — N186 End stage renal disease: Secondary | ICD-10-CM | POA: Diagnosis not present

## 2011-10-03 DIAGNOSIS — N2581 Secondary hyperparathyroidism of renal origin: Secondary | ICD-10-CM | POA: Diagnosis not present

## 2011-10-03 DIAGNOSIS — N039 Chronic nephritic syndrome with unspecified morphologic changes: Secondary | ICD-10-CM | POA: Diagnosis not present

## 2011-10-03 DIAGNOSIS — D509 Iron deficiency anemia, unspecified: Secondary | ICD-10-CM | POA: Diagnosis not present

## 2011-10-05 DIAGNOSIS — N186 End stage renal disease: Secondary | ICD-10-CM | POA: Diagnosis not present

## 2011-10-06 DIAGNOSIS — D509 Iron deficiency anemia, unspecified: Secondary | ICD-10-CM | POA: Diagnosis not present

## 2011-10-06 DIAGNOSIS — D631 Anemia in chronic kidney disease: Secondary | ICD-10-CM | POA: Diagnosis not present

## 2011-10-06 DIAGNOSIS — Z23 Encounter for immunization: Secondary | ICD-10-CM | POA: Diagnosis not present

## 2011-10-06 DIAGNOSIS — N2581 Secondary hyperparathyroidism of renal origin: Secondary | ICD-10-CM | POA: Diagnosis not present

## 2011-10-06 DIAGNOSIS — N186 End stage renal disease: Secondary | ICD-10-CM | POA: Diagnosis not present

## 2011-10-07 DIAGNOSIS — E785 Hyperlipidemia, unspecified: Secondary | ICD-10-CM | POA: Diagnosis not present

## 2011-10-07 DIAGNOSIS — I120 Hypertensive chronic kidney disease with stage 5 chronic kidney disease or end stage renal disease: Secondary | ICD-10-CM | POA: Diagnosis not present

## 2011-10-07 DIAGNOSIS — N186 End stage renal disease: Secondary | ICD-10-CM | POA: Diagnosis not present

## 2011-10-07 DIAGNOSIS — I2510 Atherosclerotic heart disease of native coronary artery without angina pectoris: Secondary | ICD-10-CM | POA: Diagnosis not present

## 2011-10-07 DIAGNOSIS — Z9861 Coronary angioplasty status: Secondary | ICD-10-CM | POA: Diagnosis not present

## 2011-10-07 DIAGNOSIS — Z5189 Encounter for other specified aftercare: Secondary | ICD-10-CM | POA: Diagnosis not present

## 2011-10-14 DIAGNOSIS — C649 Malignant neoplasm of unspecified kidney, except renal pelvis: Secondary | ICD-10-CM | POA: Diagnosis not present

## 2011-10-14 DIAGNOSIS — Z1289 Encounter for screening for malignant neoplasm of other sites: Secondary | ICD-10-CM | POA: Diagnosis not present

## 2011-10-15 DIAGNOSIS — M25559 Pain in unspecified hip: Secondary | ICD-10-CM | POA: Diagnosis not present

## 2011-11-05 DIAGNOSIS — N186 End stage renal disease: Secondary | ICD-10-CM | POA: Diagnosis not present

## 2011-11-06 DIAGNOSIS — E785 Hyperlipidemia, unspecified: Secondary | ICD-10-CM | POA: Diagnosis not present

## 2011-11-06 DIAGNOSIS — I120 Hypertensive chronic kidney disease with stage 5 chronic kidney disease or end stage renal disease: Secondary | ICD-10-CM | POA: Diagnosis not present

## 2011-11-06 DIAGNOSIS — N186 End stage renal disease: Secondary | ICD-10-CM | POA: Diagnosis not present

## 2011-11-06 DIAGNOSIS — Z5189 Encounter for other specified aftercare: Secondary | ICD-10-CM | POA: Diagnosis not present

## 2011-11-06 DIAGNOSIS — Z9861 Coronary angioplasty status: Secondary | ICD-10-CM | POA: Diagnosis not present

## 2011-11-06 DIAGNOSIS — I2510 Atherosclerotic heart disease of native coronary artery without angina pectoris: Secondary | ICD-10-CM | POA: Diagnosis not present

## 2011-11-07 DIAGNOSIS — D631 Anemia in chronic kidney disease: Secondary | ICD-10-CM | POA: Diagnosis not present

## 2011-11-07 DIAGNOSIS — N2581 Secondary hyperparathyroidism of renal origin: Secondary | ICD-10-CM | POA: Diagnosis not present

## 2011-11-07 DIAGNOSIS — N186 End stage renal disease: Secondary | ICD-10-CM | POA: Diagnosis not present

## 2011-11-07 DIAGNOSIS — D509 Iron deficiency anemia, unspecified: Secondary | ICD-10-CM | POA: Diagnosis not present

## 2011-11-07 DIAGNOSIS — N039 Chronic nephritic syndrome with unspecified morphologic changes: Secondary | ICD-10-CM | POA: Diagnosis not present

## 2011-11-10 DIAGNOSIS — D509 Iron deficiency anemia, unspecified: Secondary | ICD-10-CM | POA: Diagnosis not present

## 2011-11-10 DIAGNOSIS — N186 End stage renal disease: Secondary | ICD-10-CM | POA: Diagnosis not present

## 2011-11-10 DIAGNOSIS — D631 Anemia in chronic kidney disease: Secondary | ICD-10-CM | POA: Diagnosis not present

## 2011-11-10 DIAGNOSIS — N2581 Secondary hyperparathyroidism of renal origin: Secondary | ICD-10-CM | POA: Diagnosis not present

## 2011-11-11 DIAGNOSIS — Z9861 Coronary angioplasty status: Secondary | ICD-10-CM | POA: Diagnosis not present

## 2011-11-11 DIAGNOSIS — N186 End stage renal disease: Secondary | ICD-10-CM | POA: Diagnosis not present

## 2011-11-11 DIAGNOSIS — I120 Hypertensive chronic kidney disease with stage 5 chronic kidney disease or end stage renal disease: Secondary | ICD-10-CM | POA: Diagnosis not present

## 2011-11-11 DIAGNOSIS — Z5189 Encounter for other specified aftercare: Secondary | ICD-10-CM | POA: Diagnosis not present

## 2011-11-11 DIAGNOSIS — E785 Hyperlipidemia, unspecified: Secondary | ICD-10-CM | POA: Diagnosis not present

## 2011-11-11 DIAGNOSIS — I2510 Atherosclerotic heart disease of native coronary artery without angina pectoris: Secondary | ICD-10-CM | POA: Diagnosis not present

## 2011-11-12 DIAGNOSIS — N186 End stage renal disease: Secondary | ICD-10-CM | POA: Diagnosis not present

## 2011-11-12 DIAGNOSIS — Z7982 Long term (current) use of aspirin: Secondary | ICD-10-CM | POA: Diagnosis not present

## 2011-11-12 DIAGNOSIS — I259 Chronic ischemic heart disease, unspecified: Secondary | ICD-10-CM | POA: Diagnosis not present

## 2011-11-12 DIAGNOSIS — Z79899 Other long term (current) drug therapy: Secondary | ICD-10-CM | POA: Diagnosis not present

## 2011-11-12 DIAGNOSIS — N2581 Secondary hyperparathyroidism of renal origin: Secondary | ICD-10-CM | POA: Diagnosis not present

## 2011-11-12 DIAGNOSIS — N039 Chronic nephritic syndrome with unspecified morphologic changes: Secondary | ICD-10-CM | POA: Diagnosis not present

## 2011-11-12 DIAGNOSIS — I517 Cardiomegaly: Secondary | ICD-10-CM | POA: Diagnosis not present

## 2011-11-12 DIAGNOSIS — Z0000 Encounter for general adult medical examination without abnormal findings: Secondary | ICD-10-CM | POA: Diagnosis not present

## 2011-11-12 DIAGNOSIS — Z992 Dependence on renal dialysis: Secondary | ICD-10-CM | POA: Diagnosis not present

## 2011-11-12 DIAGNOSIS — I1311 Hypertensive heart and chronic kidney disease without heart failure, with stage 5 chronic kidney disease, or end stage renal disease: Secondary | ICD-10-CM | POA: Diagnosis not present

## 2011-11-12 DIAGNOSIS — D509 Iron deficiency anemia, unspecified: Secondary | ICD-10-CM | POA: Diagnosis not present

## 2011-11-13 DIAGNOSIS — I2510 Atherosclerotic heart disease of native coronary artery without angina pectoris: Secondary | ICD-10-CM | POA: Diagnosis not present

## 2011-11-13 DIAGNOSIS — Z9861 Coronary angioplasty status: Secondary | ICD-10-CM | POA: Diagnosis not present

## 2011-11-13 DIAGNOSIS — N186 End stage renal disease: Secondary | ICD-10-CM | POA: Diagnosis not present

## 2011-11-13 DIAGNOSIS — E785 Hyperlipidemia, unspecified: Secondary | ICD-10-CM | POA: Diagnosis not present

## 2011-11-13 DIAGNOSIS — I120 Hypertensive chronic kidney disease with stage 5 chronic kidney disease or end stage renal disease: Secondary | ICD-10-CM | POA: Diagnosis not present

## 2011-11-13 DIAGNOSIS — Z5189 Encounter for other specified aftercare: Secondary | ICD-10-CM | POA: Diagnosis not present

## 2011-11-14 DIAGNOSIS — D509 Iron deficiency anemia, unspecified: Secondary | ICD-10-CM | POA: Diagnosis not present

## 2011-11-14 DIAGNOSIS — N2581 Secondary hyperparathyroidism of renal origin: Secondary | ICD-10-CM | POA: Diagnosis not present

## 2011-11-14 DIAGNOSIS — D631 Anemia in chronic kidney disease: Secondary | ICD-10-CM | POA: Diagnosis not present

## 2011-11-14 DIAGNOSIS — N186 End stage renal disease: Secondary | ICD-10-CM | POA: Diagnosis not present

## 2011-11-17 DIAGNOSIS — D509 Iron deficiency anemia, unspecified: Secondary | ICD-10-CM | POA: Diagnosis not present

## 2011-11-17 DIAGNOSIS — D631 Anemia in chronic kidney disease: Secondary | ICD-10-CM | POA: Diagnosis not present

## 2011-11-17 DIAGNOSIS — N2581 Secondary hyperparathyroidism of renal origin: Secondary | ICD-10-CM | POA: Diagnosis not present

## 2011-11-17 DIAGNOSIS — N186 End stage renal disease: Secondary | ICD-10-CM | POA: Diagnosis not present

## 2011-11-18 DIAGNOSIS — E785 Hyperlipidemia, unspecified: Secondary | ICD-10-CM | POA: Diagnosis not present

## 2011-11-18 DIAGNOSIS — I120 Hypertensive chronic kidney disease with stage 5 chronic kidney disease or end stage renal disease: Secondary | ICD-10-CM | POA: Diagnosis not present

## 2011-11-18 DIAGNOSIS — N186 End stage renal disease: Secondary | ICD-10-CM | POA: Diagnosis not present

## 2011-11-18 DIAGNOSIS — I2510 Atherosclerotic heart disease of native coronary artery without angina pectoris: Secondary | ICD-10-CM | POA: Diagnosis not present

## 2011-11-18 DIAGNOSIS — Z9861 Coronary angioplasty status: Secondary | ICD-10-CM | POA: Diagnosis not present

## 2011-11-18 DIAGNOSIS — Z5189 Encounter for other specified aftercare: Secondary | ICD-10-CM | POA: Diagnosis not present

## 2011-11-19 DIAGNOSIS — N039 Chronic nephritic syndrome with unspecified morphologic changes: Secondary | ICD-10-CM | POA: Diagnosis not present

## 2011-11-19 DIAGNOSIS — N186 End stage renal disease: Secondary | ICD-10-CM | POA: Diagnosis not present

## 2011-11-19 DIAGNOSIS — N2581 Secondary hyperparathyroidism of renal origin: Secondary | ICD-10-CM | POA: Diagnosis not present

## 2011-11-19 DIAGNOSIS — D509 Iron deficiency anemia, unspecified: Secondary | ICD-10-CM | POA: Diagnosis not present

## 2011-11-20 DIAGNOSIS — I2510 Atherosclerotic heart disease of native coronary artery without angina pectoris: Secondary | ICD-10-CM | POA: Diagnosis not present

## 2011-11-20 DIAGNOSIS — Z5189 Encounter for other specified aftercare: Secondary | ICD-10-CM | POA: Diagnosis not present

## 2011-11-20 DIAGNOSIS — I120 Hypertensive chronic kidney disease with stage 5 chronic kidney disease or end stage renal disease: Secondary | ICD-10-CM | POA: Diagnosis not present

## 2011-11-20 DIAGNOSIS — N186 End stage renal disease: Secondary | ICD-10-CM | POA: Diagnosis not present

## 2011-11-20 DIAGNOSIS — Z9861 Coronary angioplasty status: Secondary | ICD-10-CM | POA: Diagnosis not present

## 2011-11-20 DIAGNOSIS — E785 Hyperlipidemia, unspecified: Secondary | ICD-10-CM | POA: Diagnosis not present

## 2011-11-21 DIAGNOSIS — D631 Anemia in chronic kidney disease: Secondary | ICD-10-CM | POA: Diagnosis not present

## 2011-11-21 DIAGNOSIS — N186 End stage renal disease: Secondary | ICD-10-CM | POA: Diagnosis not present

## 2011-11-21 DIAGNOSIS — N2581 Secondary hyperparathyroidism of renal origin: Secondary | ICD-10-CM | POA: Diagnosis not present

## 2011-11-21 DIAGNOSIS — D509 Iron deficiency anemia, unspecified: Secondary | ICD-10-CM | POA: Diagnosis not present

## 2011-11-24 DIAGNOSIS — D631 Anemia in chronic kidney disease: Secondary | ICD-10-CM | POA: Diagnosis not present

## 2011-11-24 DIAGNOSIS — N2581 Secondary hyperparathyroidism of renal origin: Secondary | ICD-10-CM | POA: Diagnosis not present

## 2011-11-24 DIAGNOSIS — N186 End stage renal disease: Secondary | ICD-10-CM | POA: Diagnosis not present

## 2011-11-24 DIAGNOSIS — D509 Iron deficiency anemia, unspecified: Secondary | ICD-10-CM | POA: Diagnosis not present

## 2011-11-25 DIAGNOSIS — Z5189 Encounter for other specified aftercare: Secondary | ICD-10-CM | POA: Diagnosis not present

## 2011-11-25 DIAGNOSIS — N186 End stage renal disease: Secondary | ICD-10-CM | POA: Diagnosis not present

## 2011-11-25 DIAGNOSIS — I120 Hypertensive chronic kidney disease with stage 5 chronic kidney disease or end stage renal disease: Secondary | ICD-10-CM | POA: Diagnosis not present

## 2011-11-25 DIAGNOSIS — I2510 Atherosclerotic heart disease of native coronary artery without angina pectoris: Secondary | ICD-10-CM | POA: Diagnosis not present

## 2011-11-25 DIAGNOSIS — E785 Hyperlipidemia, unspecified: Secondary | ICD-10-CM | POA: Diagnosis not present

## 2011-11-25 DIAGNOSIS — Z9861 Coronary angioplasty status: Secondary | ICD-10-CM | POA: Diagnosis not present

## 2011-11-26 DIAGNOSIS — D509 Iron deficiency anemia, unspecified: Secondary | ICD-10-CM | POA: Diagnosis not present

## 2011-11-26 DIAGNOSIS — N039 Chronic nephritic syndrome with unspecified morphologic changes: Secondary | ICD-10-CM | POA: Diagnosis not present

## 2011-11-26 DIAGNOSIS — N2581 Secondary hyperparathyroidism of renal origin: Secondary | ICD-10-CM | POA: Diagnosis not present

## 2011-11-26 DIAGNOSIS — N186 End stage renal disease: Secondary | ICD-10-CM | POA: Diagnosis not present

## 2011-11-27 DIAGNOSIS — I2510 Atherosclerotic heart disease of native coronary artery without angina pectoris: Secondary | ICD-10-CM | POA: Diagnosis not present

## 2011-11-27 DIAGNOSIS — Z5189 Encounter for other specified aftercare: Secondary | ICD-10-CM | POA: Diagnosis not present

## 2011-11-27 DIAGNOSIS — I120 Hypertensive chronic kidney disease with stage 5 chronic kidney disease or end stage renal disease: Secondary | ICD-10-CM | POA: Diagnosis not present

## 2011-11-27 DIAGNOSIS — Z9861 Coronary angioplasty status: Secondary | ICD-10-CM | POA: Diagnosis not present

## 2011-11-27 DIAGNOSIS — E785 Hyperlipidemia, unspecified: Secondary | ICD-10-CM | POA: Diagnosis not present

## 2011-11-27 DIAGNOSIS — N186 End stage renal disease: Secondary | ICD-10-CM | POA: Diagnosis not present

## 2011-11-28 DIAGNOSIS — N186 End stage renal disease: Secondary | ICD-10-CM | POA: Diagnosis not present

## 2011-11-28 DIAGNOSIS — D631 Anemia in chronic kidney disease: Secondary | ICD-10-CM | POA: Diagnosis not present

## 2011-11-28 DIAGNOSIS — D509 Iron deficiency anemia, unspecified: Secondary | ICD-10-CM | POA: Diagnosis not present

## 2011-11-28 DIAGNOSIS — N2581 Secondary hyperparathyroidism of renal origin: Secondary | ICD-10-CM | POA: Diagnosis not present

## 2011-11-28 LAB — BASIC METABOLIC PANEL: Sodium: 139 mmol/L (ref 137–147)

## 2011-11-28 LAB — CBC AND DIFFERENTIAL
Hemoglobin: 13.1 g/dL — AB (ref 13.5–17.5)
Platelets: 168 10*3/uL (ref 150–399)
WBC: 6.7 10^3/mL

## 2011-12-01 DIAGNOSIS — D631 Anemia in chronic kidney disease: Secondary | ICD-10-CM | POA: Diagnosis not present

## 2011-12-01 DIAGNOSIS — N186 End stage renal disease: Secondary | ICD-10-CM | POA: Diagnosis not present

## 2011-12-01 DIAGNOSIS — N2581 Secondary hyperparathyroidism of renal origin: Secondary | ICD-10-CM | POA: Diagnosis not present

## 2011-12-01 DIAGNOSIS — D509 Iron deficiency anemia, unspecified: Secondary | ICD-10-CM | POA: Diagnosis not present

## 2011-12-03 DIAGNOSIS — N186 End stage renal disease: Secondary | ICD-10-CM | POA: Diagnosis not present

## 2011-12-03 DIAGNOSIS — N039 Chronic nephritic syndrome with unspecified morphologic changes: Secondary | ICD-10-CM | POA: Diagnosis not present

## 2011-12-03 DIAGNOSIS — N2581 Secondary hyperparathyroidism of renal origin: Secondary | ICD-10-CM | POA: Diagnosis not present

## 2011-12-03 DIAGNOSIS — D509 Iron deficiency anemia, unspecified: Secondary | ICD-10-CM | POA: Diagnosis not present

## 2011-12-04 DIAGNOSIS — N179 Acute kidney failure, unspecified: Secondary | ICD-10-CM | POA: Diagnosis not present

## 2011-12-04 DIAGNOSIS — J449 Chronic obstructive pulmonary disease, unspecified: Secondary | ICD-10-CM | POA: Diagnosis not present

## 2011-12-04 DIAGNOSIS — E559 Vitamin D deficiency, unspecified: Secondary | ICD-10-CM | POA: Diagnosis not present

## 2011-12-04 DIAGNOSIS — E785 Hyperlipidemia, unspecified: Secondary | ICD-10-CM | POA: Diagnosis not present

## 2011-12-04 DIAGNOSIS — N189 Chronic kidney disease, unspecified: Secondary | ICD-10-CM | POA: Diagnosis not present

## 2011-12-04 DIAGNOSIS — E871 Hypo-osmolality and hyponatremia: Secondary | ICD-10-CM | POA: Diagnosis not present

## 2011-12-04 DIAGNOSIS — I10 Essential (primary) hypertension: Secondary | ICD-10-CM | POA: Diagnosis not present

## 2011-12-05 DIAGNOSIS — D631 Anemia in chronic kidney disease: Secondary | ICD-10-CM | POA: Diagnosis not present

## 2011-12-05 DIAGNOSIS — N186 End stage renal disease: Secondary | ICD-10-CM | POA: Diagnosis not present

## 2011-12-05 DIAGNOSIS — D509 Iron deficiency anemia, unspecified: Secondary | ICD-10-CM | POA: Diagnosis not present

## 2011-12-05 DIAGNOSIS — N2581 Secondary hyperparathyroidism of renal origin: Secondary | ICD-10-CM | POA: Diagnosis not present

## 2011-12-06 DIAGNOSIS — N186 End stage renal disease: Secondary | ICD-10-CM | POA: Diagnosis not present

## 2011-12-06 LAB — BASIC METABOLIC PANEL
BUN: 35 mg/dL — AB (ref 4–21)
Glucose: 85 mg/dL
Potassium: 5 mmol/L (ref 3.4–5.3)

## 2011-12-06 LAB — LIPID PANEL: LDL Cholesterol: 119 mg/dL

## 2011-12-06 LAB — HEPATIC FUNCTION PANEL
AST: 20 U/L (ref 14–40)
Alkaline Phosphatase: 98 U/L (ref 25–125)
Bilirubin, Total: 0.5 mg/dL

## 2011-12-08 DIAGNOSIS — D631 Anemia in chronic kidney disease: Secondary | ICD-10-CM | POA: Diagnosis not present

## 2011-12-08 DIAGNOSIS — D509 Iron deficiency anemia, unspecified: Secondary | ICD-10-CM | POA: Diagnosis not present

## 2011-12-08 DIAGNOSIS — N2581 Secondary hyperparathyroidism of renal origin: Secondary | ICD-10-CM | POA: Diagnosis not present

## 2011-12-08 DIAGNOSIS — N186 End stage renal disease: Secondary | ICD-10-CM | POA: Diagnosis not present

## 2011-12-08 DIAGNOSIS — Z23 Encounter for immunization: Secondary | ICD-10-CM | POA: Diagnosis not present

## 2011-12-10 DIAGNOSIS — H2510 Age-related nuclear cataract, unspecified eye: Secondary | ICD-10-CM | POA: Diagnosis not present

## 2011-12-10 DIAGNOSIS — N2581 Secondary hyperparathyroidism of renal origin: Secondary | ICD-10-CM | POA: Diagnosis not present

## 2011-12-10 DIAGNOSIS — E785 Hyperlipidemia, unspecified: Secondary | ICD-10-CM | POA: Diagnosis not present

## 2011-12-10 DIAGNOSIS — Z9889 Other specified postprocedural states: Secondary | ICD-10-CM | POA: Diagnosis not present

## 2011-12-10 DIAGNOSIS — Z23 Encounter for immunization: Secondary | ICD-10-CM | POA: Diagnosis not present

## 2011-12-10 DIAGNOSIS — N186 End stage renal disease: Secondary | ICD-10-CM | POA: Diagnosis not present

## 2011-12-10 DIAGNOSIS — D509 Iron deficiency anemia, unspecified: Secondary | ICD-10-CM | POA: Diagnosis not present

## 2011-12-10 DIAGNOSIS — N039 Chronic nephritic syndrome with unspecified morphologic changes: Secondary | ICD-10-CM | POA: Diagnosis not present

## 2011-12-12 DIAGNOSIS — D509 Iron deficiency anemia, unspecified: Secondary | ICD-10-CM | POA: Diagnosis not present

## 2011-12-12 DIAGNOSIS — D631 Anemia in chronic kidney disease: Secondary | ICD-10-CM | POA: Diagnosis not present

## 2011-12-12 DIAGNOSIS — Z23 Encounter for immunization: Secondary | ICD-10-CM | POA: Diagnosis not present

## 2011-12-12 DIAGNOSIS — N186 End stage renal disease: Secondary | ICD-10-CM | POA: Diagnosis not present

## 2011-12-12 DIAGNOSIS — N2581 Secondary hyperparathyroidism of renal origin: Secondary | ICD-10-CM | POA: Diagnosis not present

## 2011-12-15 DIAGNOSIS — D509 Iron deficiency anemia, unspecified: Secondary | ICD-10-CM | POA: Diagnosis not present

## 2011-12-15 DIAGNOSIS — N186 End stage renal disease: Secondary | ICD-10-CM | POA: Diagnosis not present

## 2011-12-15 DIAGNOSIS — Z23 Encounter for immunization: Secondary | ICD-10-CM | POA: Diagnosis not present

## 2011-12-15 DIAGNOSIS — N2581 Secondary hyperparathyroidism of renal origin: Secondary | ICD-10-CM | POA: Diagnosis not present

## 2011-12-15 DIAGNOSIS — N039 Chronic nephritic syndrome with unspecified morphologic changes: Secondary | ICD-10-CM | POA: Diagnosis not present

## 2011-12-17 DIAGNOSIS — D631 Anemia in chronic kidney disease: Secondary | ICD-10-CM | POA: Diagnosis not present

## 2011-12-17 DIAGNOSIS — Z23 Encounter for immunization: Secondary | ICD-10-CM | POA: Diagnosis not present

## 2011-12-17 DIAGNOSIS — N186 End stage renal disease: Secondary | ICD-10-CM | POA: Diagnosis not present

## 2011-12-17 DIAGNOSIS — N2581 Secondary hyperparathyroidism of renal origin: Secondary | ICD-10-CM | POA: Diagnosis not present

## 2011-12-17 DIAGNOSIS — D509 Iron deficiency anemia, unspecified: Secondary | ICD-10-CM | POA: Diagnosis not present

## 2011-12-19 DIAGNOSIS — N186 End stage renal disease: Secondary | ICD-10-CM | POA: Diagnosis not present

## 2011-12-19 DIAGNOSIS — N039 Chronic nephritic syndrome with unspecified morphologic changes: Secondary | ICD-10-CM | POA: Diagnosis not present

## 2011-12-19 DIAGNOSIS — N2581 Secondary hyperparathyroidism of renal origin: Secondary | ICD-10-CM | POA: Diagnosis not present

## 2011-12-19 DIAGNOSIS — Z23 Encounter for immunization: Secondary | ICD-10-CM | POA: Diagnosis not present

## 2011-12-19 DIAGNOSIS — D509 Iron deficiency anemia, unspecified: Secondary | ICD-10-CM | POA: Diagnosis not present

## 2011-12-22 DIAGNOSIS — N186 End stage renal disease: Secondary | ICD-10-CM | POA: Diagnosis not present

## 2011-12-22 DIAGNOSIS — Z23 Encounter for immunization: Secondary | ICD-10-CM | POA: Diagnosis not present

## 2011-12-22 DIAGNOSIS — N039 Chronic nephritic syndrome with unspecified morphologic changes: Secondary | ICD-10-CM | POA: Diagnosis not present

## 2011-12-22 DIAGNOSIS — D509 Iron deficiency anemia, unspecified: Secondary | ICD-10-CM | POA: Diagnosis not present

## 2011-12-22 DIAGNOSIS — N2581 Secondary hyperparathyroidism of renal origin: Secondary | ICD-10-CM | POA: Diagnosis not present

## 2011-12-24 DIAGNOSIS — Z23 Encounter for immunization: Secondary | ICD-10-CM | POA: Diagnosis not present

## 2011-12-24 DIAGNOSIS — N186 End stage renal disease: Secondary | ICD-10-CM | POA: Diagnosis not present

## 2011-12-24 DIAGNOSIS — D509 Iron deficiency anemia, unspecified: Secondary | ICD-10-CM | POA: Diagnosis not present

## 2011-12-24 DIAGNOSIS — N2581 Secondary hyperparathyroidism of renal origin: Secondary | ICD-10-CM | POA: Diagnosis not present

## 2011-12-24 DIAGNOSIS — N039 Chronic nephritic syndrome with unspecified morphologic changes: Secondary | ICD-10-CM | POA: Diagnosis not present

## 2011-12-26 DIAGNOSIS — D509 Iron deficiency anemia, unspecified: Secondary | ICD-10-CM | POA: Diagnosis not present

## 2011-12-26 DIAGNOSIS — Z23 Encounter for immunization: Secondary | ICD-10-CM | POA: Diagnosis not present

## 2011-12-26 DIAGNOSIS — D631 Anemia in chronic kidney disease: Secondary | ICD-10-CM | POA: Diagnosis not present

## 2011-12-26 DIAGNOSIS — N186 End stage renal disease: Secondary | ICD-10-CM | POA: Diagnosis not present

## 2011-12-26 DIAGNOSIS — N2581 Secondary hyperparathyroidism of renal origin: Secondary | ICD-10-CM | POA: Diagnosis not present

## 2011-12-29 DIAGNOSIS — D509 Iron deficiency anemia, unspecified: Secondary | ICD-10-CM | POA: Diagnosis not present

## 2011-12-29 DIAGNOSIS — Z23 Encounter for immunization: Secondary | ICD-10-CM | POA: Diagnosis not present

## 2011-12-29 DIAGNOSIS — N039 Chronic nephritic syndrome with unspecified morphologic changes: Secondary | ICD-10-CM | POA: Diagnosis not present

## 2011-12-29 DIAGNOSIS — N186 End stage renal disease: Secondary | ICD-10-CM | POA: Diagnosis not present

## 2011-12-29 DIAGNOSIS — N2581 Secondary hyperparathyroidism of renal origin: Secondary | ICD-10-CM | POA: Diagnosis not present

## 2011-12-31 DIAGNOSIS — N186 End stage renal disease: Secondary | ICD-10-CM | POA: Diagnosis not present

## 2011-12-31 DIAGNOSIS — N2581 Secondary hyperparathyroidism of renal origin: Secondary | ICD-10-CM | POA: Diagnosis not present

## 2011-12-31 DIAGNOSIS — N039 Chronic nephritic syndrome with unspecified morphologic changes: Secondary | ICD-10-CM | POA: Diagnosis not present

## 2011-12-31 DIAGNOSIS — Z23 Encounter for immunization: Secondary | ICD-10-CM | POA: Diagnosis not present

## 2011-12-31 DIAGNOSIS — D509 Iron deficiency anemia, unspecified: Secondary | ICD-10-CM | POA: Diagnosis not present

## 2012-01-02 DIAGNOSIS — Z23 Encounter for immunization: Secondary | ICD-10-CM | POA: Diagnosis not present

## 2012-01-02 DIAGNOSIS — D509 Iron deficiency anemia, unspecified: Secondary | ICD-10-CM | POA: Diagnosis not present

## 2012-01-02 DIAGNOSIS — N186 End stage renal disease: Secondary | ICD-10-CM | POA: Diagnosis not present

## 2012-01-02 DIAGNOSIS — D631 Anemia in chronic kidney disease: Secondary | ICD-10-CM | POA: Diagnosis not present

## 2012-01-02 DIAGNOSIS — N2581 Secondary hyperparathyroidism of renal origin: Secondary | ICD-10-CM | POA: Diagnosis not present

## 2012-01-05 DIAGNOSIS — D509 Iron deficiency anemia, unspecified: Secondary | ICD-10-CM | POA: Diagnosis not present

## 2012-01-05 DIAGNOSIS — Z23 Encounter for immunization: Secondary | ICD-10-CM | POA: Diagnosis not present

## 2012-01-05 DIAGNOSIS — D631 Anemia in chronic kidney disease: Secondary | ICD-10-CM | POA: Diagnosis not present

## 2012-01-05 DIAGNOSIS — N2581 Secondary hyperparathyroidism of renal origin: Secondary | ICD-10-CM | POA: Diagnosis not present

## 2012-01-05 DIAGNOSIS — N186 End stage renal disease: Secondary | ICD-10-CM | POA: Diagnosis not present

## 2012-01-07 DIAGNOSIS — N186 End stage renal disease: Secondary | ICD-10-CM | POA: Diagnosis not present

## 2012-01-07 DIAGNOSIS — N2581 Secondary hyperparathyroidism of renal origin: Secondary | ICD-10-CM | POA: Diagnosis not present

## 2012-01-07 DIAGNOSIS — D631 Anemia in chronic kidney disease: Secondary | ICD-10-CM | POA: Diagnosis not present

## 2012-01-07 DIAGNOSIS — D509 Iron deficiency anemia, unspecified: Secondary | ICD-10-CM | POA: Diagnosis not present

## 2012-01-09 DIAGNOSIS — N2581 Secondary hyperparathyroidism of renal origin: Secondary | ICD-10-CM | POA: Diagnosis not present

## 2012-01-09 DIAGNOSIS — N039 Chronic nephritic syndrome with unspecified morphologic changes: Secondary | ICD-10-CM | POA: Diagnosis not present

## 2012-01-09 DIAGNOSIS — D509 Iron deficiency anemia, unspecified: Secondary | ICD-10-CM | POA: Diagnosis not present

## 2012-01-09 DIAGNOSIS — N186 End stage renal disease: Secondary | ICD-10-CM | POA: Diagnosis not present

## 2012-01-12 DIAGNOSIS — N2581 Secondary hyperparathyroidism of renal origin: Secondary | ICD-10-CM | POA: Diagnosis not present

## 2012-01-12 DIAGNOSIS — D509 Iron deficiency anemia, unspecified: Secondary | ICD-10-CM | POA: Diagnosis not present

## 2012-01-12 DIAGNOSIS — D631 Anemia in chronic kidney disease: Secondary | ICD-10-CM | POA: Diagnosis not present

## 2012-01-12 DIAGNOSIS — N186 End stage renal disease: Secondary | ICD-10-CM | POA: Diagnosis not present

## 2012-01-14 DIAGNOSIS — D631 Anemia in chronic kidney disease: Secondary | ICD-10-CM | POA: Diagnosis not present

## 2012-01-14 DIAGNOSIS — N186 End stage renal disease: Secondary | ICD-10-CM | POA: Diagnosis not present

## 2012-01-14 DIAGNOSIS — D509 Iron deficiency anemia, unspecified: Secondary | ICD-10-CM | POA: Diagnosis not present

## 2012-01-14 DIAGNOSIS — N2581 Secondary hyperparathyroidism of renal origin: Secondary | ICD-10-CM | POA: Diagnosis not present

## 2012-01-16 DIAGNOSIS — D509 Iron deficiency anemia, unspecified: Secondary | ICD-10-CM | POA: Diagnosis not present

## 2012-01-16 DIAGNOSIS — N2581 Secondary hyperparathyroidism of renal origin: Secondary | ICD-10-CM | POA: Diagnosis not present

## 2012-01-16 DIAGNOSIS — N186 End stage renal disease: Secondary | ICD-10-CM | POA: Diagnosis not present

## 2012-01-16 DIAGNOSIS — N039 Chronic nephritic syndrome with unspecified morphologic changes: Secondary | ICD-10-CM | POA: Diagnosis not present

## 2012-01-19 DIAGNOSIS — N2581 Secondary hyperparathyroidism of renal origin: Secondary | ICD-10-CM | POA: Diagnosis not present

## 2012-01-19 DIAGNOSIS — N039 Chronic nephritic syndrome with unspecified morphologic changes: Secondary | ICD-10-CM | POA: Diagnosis not present

## 2012-01-19 DIAGNOSIS — N186 End stage renal disease: Secondary | ICD-10-CM | POA: Diagnosis not present

## 2012-01-19 DIAGNOSIS — D509 Iron deficiency anemia, unspecified: Secondary | ICD-10-CM | POA: Diagnosis not present

## 2012-01-21 DIAGNOSIS — D631 Anemia in chronic kidney disease: Secondary | ICD-10-CM | POA: Diagnosis not present

## 2012-01-21 DIAGNOSIS — N2581 Secondary hyperparathyroidism of renal origin: Secondary | ICD-10-CM | POA: Diagnosis not present

## 2012-01-21 DIAGNOSIS — N186 End stage renal disease: Secondary | ICD-10-CM | POA: Diagnosis not present

## 2012-01-21 DIAGNOSIS — D509 Iron deficiency anemia, unspecified: Secondary | ICD-10-CM | POA: Diagnosis not present

## 2012-01-23 DIAGNOSIS — D509 Iron deficiency anemia, unspecified: Secondary | ICD-10-CM | POA: Diagnosis not present

## 2012-01-23 DIAGNOSIS — D631 Anemia in chronic kidney disease: Secondary | ICD-10-CM | POA: Diagnosis not present

## 2012-01-23 DIAGNOSIS — N186 End stage renal disease: Secondary | ICD-10-CM | POA: Diagnosis not present

## 2012-01-23 DIAGNOSIS — N2581 Secondary hyperparathyroidism of renal origin: Secondary | ICD-10-CM | POA: Diagnosis not present

## 2012-01-26 DIAGNOSIS — D631 Anemia in chronic kidney disease: Secondary | ICD-10-CM | POA: Diagnosis not present

## 2012-01-26 DIAGNOSIS — N2581 Secondary hyperparathyroidism of renal origin: Secondary | ICD-10-CM | POA: Diagnosis not present

## 2012-01-26 DIAGNOSIS — D509 Iron deficiency anemia, unspecified: Secondary | ICD-10-CM | POA: Diagnosis not present

## 2012-01-26 DIAGNOSIS — N186 End stage renal disease: Secondary | ICD-10-CM | POA: Diagnosis not present

## 2012-01-28 DIAGNOSIS — D509 Iron deficiency anemia, unspecified: Secondary | ICD-10-CM | POA: Diagnosis not present

## 2012-01-28 DIAGNOSIS — N039 Chronic nephritic syndrome with unspecified morphologic changes: Secondary | ICD-10-CM | POA: Diagnosis not present

## 2012-01-28 DIAGNOSIS — N186 End stage renal disease: Secondary | ICD-10-CM | POA: Diagnosis not present

## 2012-01-28 DIAGNOSIS — N2581 Secondary hyperparathyroidism of renal origin: Secondary | ICD-10-CM | POA: Diagnosis not present

## 2012-01-30 DIAGNOSIS — N2581 Secondary hyperparathyroidism of renal origin: Secondary | ICD-10-CM | POA: Diagnosis not present

## 2012-01-30 DIAGNOSIS — D509 Iron deficiency anemia, unspecified: Secondary | ICD-10-CM | POA: Diagnosis not present

## 2012-01-30 DIAGNOSIS — N186 End stage renal disease: Secondary | ICD-10-CM | POA: Diagnosis not present

## 2012-01-30 DIAGNOSIS — D631 Anemia in chronic kidney disease: Secondary | ICD-10-CM | POA: Diagnosis not present

## 2012-02-02 DIAGNOSIS — D509 Iron deficiency anemia, unspecified: Secondary | ICD-10-CM | POA: Diagnosis not present

## 2012-02-02 DIAGNOSIS — D631 Anemia in chronic kidney disease: Secondary | ICD-10-CM | POA: Diagnosis not present

## 2012-02-02 DIAGNOSIS — N2581 Secondary hyperparathyroidism of renal origin: Secondary | ICD-10-CM | POA: Diagnosis not present

## 2012-02-02 DIAGNOSIS — N186 End stage renal disease: Secondary | ICD-10-CM | POA: Diagnosis not present

## 2012-02-04 DIAGNOSIS — N2581 Secondary hyperparathyroidism of renal origin: Secondary | ICD-10-CM | POA: Diagnosis not present

## 2012-02-04 DIAGNOSIS — N186 End stage renal disease: Secondary | ICD-10-CM | POA: Diagnosis not present

## 2012-02-04 DIAGNOSIS — D631 Anemia in chronic kidney disease: Secondary | ICD-10-CM | POA: Diagnosis not present

## 2012-02-04 DIAGNOSIS — D509 Iron deficiency anemia, unspecified: Secondary | ICD-10-CM | POA: Diagnosis not present

## 2012-02-05 DIAGNOSIS — N186 End stage renal disease: Secondary | ICD-10-CM | POA: Diagnosis not present

## 2012-02-06 DIAGNOSIS — N2581 Secondary hyperparathyroidism of renal origin: Secondary | ICD-10-CM | POA: Diagnosis not present

## 2012-02-06 DIAGNOSIS — N186 End stage renal disease: Secondary | ICD-10-CM | POA: Diagnosis not present

## 2012-02-06 DIAGNOSIS — D509 Iron deficiency anemia, unspecified: Secondary | ICD-10-CM | POA: Diagnosis not present

## 2012-02-06 DIAGNOSIS — N039 Chronic nephritic syndrome with unspecified morphologic changes: Secondary | ICD-10-CM | POA: Diagnosis not present

## 2012-02-09 DIAGNOSIS — N186 End stage renal disease: Secondary | ICD-10-CM | POA: Diagnosis not present

## 2012-02-09 DIAGNOSIS — D509 Iron deficiency anemia, unspecified: Secondary | ICD-10-CM | POA: Diagnosis not present

## 2012-02-09 DIAGNOSIS — D631 Anemia in chronic kidney disease: Secondary | ICD-10-CM | POA: Diagnosis not present

## 2012-02-09 DIAGNOSIS — N2581 Secondary hyperparathyroidism of renal origin: Secondary | ICD-10-CM | POA: Diagnosis not present

## 2012-02-11 DIAGNOSIS — D631 Anemia in chronic kidney disease: Secondary | ICD-10-CM | POA: Diagnosis not present

## 2012-02-11 DIAGNOSIS — N2581 Secondary hyperparathyroidism of renal origin: Secondary | ICD-10-CM | POA: Diagnosis not present

## 2012-02-11 DIAGNOSIS — D509 Iron deficiency anemia, unspecified: Secondary | ICD-10-CM | POA: Diagnosis not present

## 2012-02-11 DIAGNOSIS — N186 End stage renal disease: Secondary | ICD-10-CM | POA: Diagnosis not present

## 2012-02-13 DIAGNOSIS — D509 Iron deficiency anemia, unspecified: Secondary | ICD-10-CM | POA: Diagnosis not present

## 2012-02-13 DIAGNOSIS — N186 End stage renal disease: Secondary | ICD-10-CM | POA: Diagnosis not present

## 2012-02-13 DIAGNOSIS — D631 Anemia in chronic kidney disease: Secondary | ICD-10-CM | POA: Diagnosis not present

## 2012-02-13 DIAGNOSIS — N2581 Secondary hyperparathyroidism of renal origin: Secondary | ICD-10-CM | POA: Diagnosis not present

## 2012-02-16 DIAGNOSIS — N186 End stage renal disease: Secondary | ICD-10-CM | POA: Diagnosis not present

## 2012-02-16 DIAGNOSIS — N2581 Secondary hyperparathyroidism of renal origin: Secondary | ICD-10-CM | POA: Diagnosis not present

## 2012-02-16 DIAGNOSIS — N039 Chronic nephritic syndrome with unspecified morphologic changes: Secondary | ICD-10-CM | POA: Diagnosis not present

## 2012-02-16 DIAGNOSIS — D509 Iron deficiency anemia, unspecified: Secondary | ICD-10-CM | POA: Diagnosis not present

## 2012-02-18 DIAGNOSIS — D509 Iron deficiency anemia, unspecified: Secondary | ICD-10-CM | POA: Diagnosis not present

## 2012-02-18 DIAGNOSIS — N2581 Secondary hyperparathyroidism of renal origin: Secondary | ICD-10-CM | POA: Diagnosis not present

## 2012-02-18 DIAGNOSIS — N186 End stage renal disease: Secondary | ICD-10-CM | POA: Diagnosis not present

## 2012-02-18 DIAGNOSIS — N039 Chronic nephritic syndrome with unspecified morphologic changes: Secondary | ICD-10-CM | POA: Diagnosis not present

## 2012-02-20 DIAGNOSIS — D509 Iron deficiency anemia, unspecified: Secondary | ICD-10-CM | POA: Diagnosis not present

## 2012-02-20 DIAGNOSIS — N039 Chronic nephritic syndrome with unspecified morphologic changes: Secondary | ICD-10-CM | POA: Diagnosis not present

## 2012-02-20 DIAGNOSIS — N2581 Secondary hyperparathyroidism of renal origin: Secondary | ICD-10-CM | POA: Diagnosis not present

## 2012-02-20 DIAGNOSIS — N186 End stage renal disease: Secondary | ICD-10-CM | POA: Diagnosis not present

## 2012-02-23 DIAGNOSIS — N2581 Secondary hyperparathyroidism of renal origin: Secondary | ICD-10-CM | POA: Diagnosis not present

## 2012-02-23 DIAGNOSIS — D631 Anemia in chronic kidney disease: Secondary | ICD-10-CM | POA: Diagnosis not present

## 2012-02-23 DIAGNOSIS — D509 Iron deficiency anemia, unspecified: Secondary | ICD-10-CM | POA: Diagnosis not present

## 2012-02-23 DIAGNOSIS — N186 End stage renal disease: Secondary | ICD-10-CM | POA: Diagnosis not present

## 2012-02-25 DIAGNOSIS — N039 Chronic nephritic syndrome with unspecified morphologic changes: Secondary | ICD-10-CM | POA: Diagnosis not present

## 2012-02-25 DIAGNOSIS — N186 End stage renal disease: Secondary | ICD-10-CM | POA: Diagnosis not present

## 2012-02-25 DIAGNOSIS — N2581 Secondary hyperparathyroidism of renal origin: Secondary | ICD-10-CM | POA: Diagnosis not present

## 2012-02-25 DIAGNOSIS — D509 Iron deficiency anemia, unspecified: Secondary | ICD-10-CM | POA: Diagnosis not present

## 2012-02-27 DIAGNOSIS — N2581 Secondary hyperparathyroidism of renal origin: Secondary | ICD-10-CM | POA: Diagnosis not present

## 2012-02-27 DIAGNOSIS — D631 Anemia in chronic kidney disease: Secondary | ICD-10-CM | POA: Diagnosis not present

## 2012-02-27 DIAGNOSIS — D509 Iron deficiency anemia, unspecified: Secondary | ICD-10-CM | POA: Diagnosis not present

## 2012-02-27 DIAGNOSIS — N186 End stage renal disease: Secondary | ICD-10-CM | POA: Diagnosis not present

## 2012-03-01 DIAGNOSIS — N2581 Secondary hyperparathyroidism of renal origin: Secondary | ICD-10-CM | POA: Diagnosis not present

## 2012-03-01 DIAGNOSIS — N186 End stage renal disease: Secondary | ICD-10-CM | POA: Diagnosis not present

## 2012-03-01 DIAGNOSIS — D509 Iron deficiency anemia, unspecified: Secondary | ICD-10-CM | POA: Diagnosis not present

## 2012-03-01 DIAGNOSIS — D631 Anemia in chronic kidney disease: Secondary | ICD-10-CM | POA: Diagnosis not present

## 2012-03-03 DIAGNOSIS — D631 Anemia in chronic kidney disease: Secondary | ICD-10-CM | POA: Diagnosis not present

## 2012-03-03 DIAGNOSIS — D509 Iron deficiency anemia, unspecified: Secondary | ICD-10-CM | POA: Diagnosis not present

## 2012-03-03 DIAGNOSIS — N2581 Secondary hyperparathyroidism of renal origin: Secondary | ICD-10-CM | POA: Diagnosis not present

## 2012-03-03 DIAGNOSIS — N186 End stage renal disease: Secondary | ICD-10-CM | POA: Diagnosis not present

## 2012-03-05 DIAGNOSIS — N2581 Secondary hyperparathyroidism of renal origin: Secondary | ICD-10-CM | POA: Diagnosis not present

## 2012-03-05 DIAGNOSIS — D631 Anemia in chronic kidney disease: Secondary | ICD-10-CM | POA: Diagnosis not present

## 2012-03-05 DIAGNOSIS — N186 End stage renal disease: Secondary | ICD-10-CM | POA: Diagnosis not present

## 2012-03-05 DIAGNOSIS — D509 Iron deficiency anemia, unspecified: Secondary | ICD-10-CM | POA: Diagnosis not present

## 2012-03-06 DIAGNOSIS — N186 End stage renal disease: Secondary | ICD-10-CM | POA: Diagnosis not present

## 2012-03-08 DIAGNOSIS — N039 Chronic nephritic syndrome with unspecified morphologic changes: Secondary | ICD-10-CM | POA: Diagnosis not present

## 2012-03-08 DIAGNOSIS — D509 Iron deficiency anemia, unspecified: Secondary | ICD-10-CM | POA: Diagnosis not present

## 2012-03-08 DIAGNOSIS — N2581 Secondary hyperparathyroidism of renal origin: Secondary | ICD-10-CM | POA: Diagnosis not present

## 2012-03-08 DIAGNOSIS — N186 End stage renal disease: Secondary | ICD-10-CM | POA: Diagnosis not present

## 2012-03-10 DIAGNOSIS — D509 Iron deficiency anemia, unspecified: Secondary | ICD-10-CM | POA: Diagnosis not present

## 2012-03-10 DIAGNOSIS — D631 Anemia in chronic kidney disease: Secondary | ICD-10-CM | POA: Diagnosis not present

## 2012-03-10 DIAGNOSIS — N186 End stage renal disease: Secondary | ICD-10-CM | POA: Diagnosis not present

## 2012-03-10 DIAGNOSIS — N2581 Secondary hyperparathyroidism of renal origin: Secondary | ICD-10-CM | POA: Diagnosis not present

## 2012-03-10 DIAGNOSIS — E785 Hyperlipidemia, unspecified: Secondary | ICD-10-CM | POA: Diagnosis not present

## 2012-03-12 DIAGNOSIS — N039 Chronic nephritic syndrome with unspecified morphologic changes: Secondary | ICD-10-CM | POA: Diagnosis not present

## 2012-03-12 DIAGNOSIS — D631 Anemia in chronic kidney disease: Secondary | ICD-10-CM | POA: Diagnosis not present

## 2012-03-12 DIAGNOSIS — D509 Iron deficiency anemia, unspecified: Secondary | ICD-10-CM | POA: Diagnosis not present

## 2012-03-12 DIAGNOSIS — N2581 Secondary hyperparathyroidism of renal origin: Secondary | ICD-10-CM | POA: Diagnosis not present

## 2012-03-12 DIAGNOSIS — N186 End stage renal disease: Secondary | ICD-10-CM | POA: Diagnosis not present

## 2012-03-15 DIAGNOSIS — N2581 Secondary hyperparathyroidism of renal origin: Secondary | ICD-10-CM | POA: Diagnosis not present

## 2012-03-15 DIAGNOSIS — D631 Anemia in chronic kidney disease: Secondary | ICD-10-CM | POA: Diagnosis not present

## 2012-03-15 DIAGNOSIS — N186 End stage renal disease: Secondary | ICD-10-CM | POA: Diagnosis not present

## 2012-03-15 DIAGNOSIS — D509 Iron deficiency anemia, unspecified: Secondary | ICD-10-CM | POA: Diagnosis not present

## 2012-03-17 DIAGNOSIS — D509 Iron deficiency anemia, unspecified: Secondary | ICD-10-CM | POA: Diagnosis not present

## 2012-03-17 DIAGNOSIS — D631 Anemia in chronic kidney disease: Secondary | ICD-10-CM | POA: Diagnosis not present

## 2012-03-17 DIAGNOSIS — N186 End stage renal disease: Secondary | ICD-10-CM | POA: Diagnosis not present

## 2012-03-17 DIAGNOSIS — N2581 Secondary hyperparathyroidism of renal origin: Secondary | ICD-10-CM | POA: Diagnosis not present

## 2012-03-19 DIAGNOSIS — N039 Chronic nephritic syndrome with unspecified morphologic changes: Secondary | ICD-10-CM | POA: Diagnosis not present

## 2012-03-19 DIAGNOSIS — N2581 Secondary hyperparathyroidism of renal origin: Secondary | ICD-10-CM | POA: Diagnosis not present

## 2012-03-19 DIAGNOSIS — N186 End stage renal disease: Secondary | ICD-10-CM | POA: Diagnosis not present

## 2012-03-19 DIAGNOSIS — D509 Iron deficiency anemia, unspecified: Secondary | ICD-10-CM | POA: Diagnosis not present

## 2012-03-22 DIAGNOSIS — N2581 Secondary hyperparathyroidism of renal origin: Secondary | ICD-10-CM | POA: Diagnosis not present

## 2012-03-22 DIAGNOSIS — N186 End stage renal disease: Secondary | ICD-10-CM | POA: Diagnosis not present

## 2012-03-22 DIAGNOSIS — N039 Chronic nephritic syndrome with unspecified morphologic changes: Secondary | ICD-10-CM | POA: Diagnosis not present

## 2012-03-22 DIAGNOSIS — D509 Iron deficiency anemia, unspecified: Secondary | ICD-10-CM | POA: Diagnosis not present

## 2012-03-24 DIAGNOSIS — N186 End stage renal disease: Secondary | ICD-10-CM | POA: Diagnosis not present

## 2012-03-24 DIAGNOSIS — D509 Iron deficiency anemia, unspecified: Secondary | ICD-10-CM | POA: Diagnosis not present

## 2012-03-24 DIAGNOSIS — N2581 Secondary hyperparathyroidism of renal origin: Secondary | ICD-10-CM | POA: Diagnosis not present

## 2012-03-24 DIAGNOSIS — N039 Chronic nephritic syndrome with unspecified morphologic changes: Secondary | ICD-10-CM | POA: Diagnosis not present

## 2012-03-26 DIAGNOSIS — N2581 Secondary hyperparathyroidism of renal origin: Secondary | ICD-10-CM | POA: Diagnosis not present

## 2012-03-26 DIAGNOSIS — N186 End stage renal disease: Secondary | ICD-10-CM | POA: Diagnosis not present

## 2012-03-26 DIAGNOSIS — D509 Iron deficiency anemia, unspecified: Secondary | ICD-10-CM | POA: Diagnosis not present

## 2012-03-26 DIAGNOSIS — D631 Anemia in chronic kidney disease: Secondary | ICD-10-CM | POA: Diagnosis not present

## 2012-03-28 DIAGNOSIS — N039 Chronic nephritic syndrome with unspecified morphologic changes: Secondary | ICD-10-CM | POA: Diagnosis not present

## 2012-03-28 DIAGNOSIS — D509 Iron deficiency anemia, unspecified: Secondary | ICD-10-CM | POA: Diagnosis not present

## 2012-03-28 DIAGNOSIS — N2581 Secondary hyperparathyroidism of renal origin: Secondary | ICD-10-CM | POA: Diagnosis not present

## 2012-03-28 DIAGNOSIS — N186 End stage renal disease: Secondary | ICD-10-CM | POA: Diagnosis not present

## 2012-03-30 DIAGNOSIS — N2581 Secondary hyperparathyroidism of renal origin: Secondary | ICD-10-CM | POA: Diagnosis not present

## 2012-03-30 DIAGNOSIS — D509 Iron deficiency anemia, unspecified: Secondary | ICD-10-CM | POA: Diagnosis not present

## 2012-03-30 DIAGNOSIS — N186 End stage renal disease: Secondary | ICD-10-CM | POA: Diagnosis not present

## 2012-03-30 DIAGNOSIS — D631 Anemia in chronic kidney disease: Secondary | ICD-10-CM | POA: Diagnosis not present

## 2012-04-02 DIAGNOSIS — N186 End stage renal disease: Secondary | ICD-10-CM | POA: Diagnosis not present

## 2012-04-02 DIAGNOSIS — N2581 Secondary hyperparathyroidism of renal origin: Secondary | ICD-10-CM | POA: Diagnosis not present

## 2012-04-02 DIAGNOSIS — D631 Anemia in chronic kidney disease: Secondary | ICD-10-CM | POA: Diagnosis not present

## 2012-04-02 DIAGNOSIS — D509 Iron deficiency anemia, unspecified: Secondary | ICD-10-CM | POA: Diagnosis not present

## 2012-04-05 DIAGNOSIS — N186 End stage renal disease: Secondary | ICD-10-CM | POA: Diagnosis not present

## 2012-04-05 DIAGNOSIS — D509 Iron deficiency anemia, unspecified: Secondary | ICD-10-CM | POA: Diagnosis not present

## 2012-04-05 DIAGNOSIS — N039 Chronic nephritic syndrome with unspecified morphologic changes: Secondary | ICD-10-CM | POA: Diagnosis not present

## 2012-04-05 DIAGNOSIS — N2581 Secondary hyperparathyroidism of renal origin: Secondary | ICD-10-CM | POA: Diagnosis not present

## 2012-04-06 DIAGNOSIS — E8779 Other fluid overload: Secondary | ICD-10-CM | POA: Diagnosis not present

## 2012-04-06 DIAGNOSIS — N186 End stage renal disease: Secondary | ICD-10-CM | POA: Diagnosis not present

## 2012-04-07 DIAGNOSIS — N186 End stage renal disease: Secondary | ICD-10-CM | POA: Diagnosis not present

## 2012-04-07 DIAGNOSIS — N2581 Secondary hyperparathyroidism of renal origin: Secondary | ICD-10-CM | POA: Diagnosis not present

## 2012-04-07 DIAGNOSIS — D631 Anemia in chronic kidney disease: Secondary | ICD-10-CM | POA: Diagnosis not present

## 2012-04-13 DIAGNOSIS — R918 Other nonspecific abnormal finding of lung field: Secondary | ICD-10-CM | POA: Diagnosis not present

## 2012-04-13 DIAGNOSIS — Z1289 Encounter for screening for malignant neoplasm of other sites: Secondary | ICD-10-CM | POA: Diagnosis not present

## 2012-04-13 DIAGNOSIS — Z483 Aftercare following surgery for neoplasm: Secondary | ICD-10-CM | POA: Diagnosis not present

## 2012-04-13 DIAGNOSIS — C649 Malignant neoplasm of unspecified kidney, except renal pelvis: Secondary | ICD-10-CM | POA: Diagnosis not present

## 2012-04-13 DIAGNOSIS — Z0389 Encounter for observation for other suspected diseases and conditions ruled out: Secondary | ICD-10-CM | POA: Diagnosis not present

## 2012-04-14 DIAGNOSIS — N186 End stage renal disease: Secondary | ICD-10-CM | POA: Diagnosis not present

## 2012-05-07 DIAGNOSIS — N186 End stage renal disease: Secondary | ICD-10-CM | POA: Diagnosis not present

## 2012-05-10 DIAGNOSIS — D509 Iron deficiency anemia, unspecified: Secondary | ICD-10-CM | POA: Diagnosis not present

## 2012-05-10 DIAGNOSIS — N2581 Secondary hyperparathyroidism of renal origin: Secondary | ICD-10-CM | POA: Diagnosis not present

## 2012-05-10 DIAGNOSIS — D631 Anemia in chronic kidney disease: Secondary | ICD-10-CM | POA: Diagnosis not present

## 2012-05-10 DIAGNOSIS — N186 End stage renal disease: Secondary | ICD-10-CM | POA: Diagnosis not present

## 2012-05-12 DIAGNOSIS — N186 End stage renal disease: Secondary | ICD-10-CM | POA: Diagnosis not present

## 2012-05-12 DIAGNOSIS — I1311 Hypertensive heart and chronic kidney disease without heart failure, with stage 5 chronic kidney disease, or end stage renal disease: Secondary | ICD-10-CM | POA: Diagnosis not present

## 2012-05-12 DIAGNOSIS — I440 Atrioventricular block, first degree: Secondary | ICD-10-CM | POA: Diagnosis not present

## 2012-05-12 DIAGNOSIS — I2510 Atherosclerotic heart disease of native coronary artery without angina pectoris: Secondary | ICD-10-CM | POA: Diagnosis not present

## 2012-05-12 DIAGNOSIS — I10 Essential (primary) hypertension: Secondary | ICD-10-CM | POA: Diagnosis not present

## 2012-05-27 DIAGNOSIS — I2789 Other specified pulmonary heart diseases: Secondary | ICD-10-CM | POA: Diagnosis not present

## 2012-05-27 DIAGNOSIS — I2510 Atherosclerotic heart disease of native coronary artery without angina pectoris: Secondary | ICD-10-CM | POA: Diagnosis not present

## 2012-05-30 DIAGNOSIS — D631 Anemia in chronic kidney disease: Secondary | ICD-10-CM | POA: Diagnosis not present

## 2012-05-30 DIAGNOSIS — I509 Heart failure, unspecified: Secondary | ICD-10-CM | POA: Insufficient documentation

## 2012-05-30 DIAGNOSIS — R0602 Shortness of breath: Secondary | ICD-10-CM | POA: Diagnosis not present

## 2012-05-30 DIAGNOSIS — R011 Cardiac murmur, unspecified: Secondary | ICD-10-CM | POA: Diagnosis not present

## 2012-05-30 DIAGNOSIS — R0989 Other specified symptoms and signs involving the circulatory and respiratory systems: Secondary | ICD-10-CM | POA: Diagnosis not present

## 2012-05-30 DIAGNOSIS — E8779 Other fluid overload: Secondary | ICD-10-CM | POA: Diagnosis not present

## 2012-05-30 DIAGNOSIS — R05 Cough: Secondary | ICD-10-CM | POA: Diagnosis not present

## 2012-05-30 DIAGNOSIS — I2510 Atherosclerotic heart disease of native coronary artery without angina pectoris: Secondary | ICD-10-CM | POA: Diagnosis not present

## 2012-05-30 DIAGNOSIS — I5040 Unspecified combined systolic (congestive) and diastolic (congestive) heart failure: Secondary | ICD-10-CM | POA: Diagnosis not present

## 2012-05-30 DIAGNOSIS — R7989 Other specified abnormal findings of blood chemistry: Secondary | ICD-10-CM | POA: Diagnosis not present

## 2012-05-30 DIAGNOSIS — I110 Hypertensive heart disease with heart failure: Secondary | ICD-10-CM | POA: Diagnosis not present

## 2012-05-30 DIAGNOSIS — R748 Abnormal levels of other serum enzymes: Secondary | ICD-10-CM | POA: Diagnosis not present

## 2012-05-30 DIAGNOSIS — N186 End stage renal disease: Secondary | ICD-10-CM | POA: Diagnosis not present

## 2012-05-30 DIAGNOSIS — Z992 Dependence on renal dialysis: Secondary | ICD-10-CM | POA: Diagnosis not present

## 2012-05-30 DIAGNOSIS — M810 Age-related osteoporosis without current pathological fracture: Secondary | ICD-10-CM | POA: Diagnosis not present

## 2012-05-30 DIAGNOSIS — I120 Hypertensive chronic kidney disease with stage 5 chronic kidney disease or end stage renal disease: Secondary | ICD-10-CM | POA: Diagnosis not present

## 2012-05-30 DIAGNOSIS — J811 Chronic pulmonary edema: Secondary | ICD-10-CM | POA: Diagnosis not present

## 2012-05-30 DIAGNOSIS — N039 Chronic nephritic syndrome with unspecified morphologic changes: Secondary | ICD-10-CM | POA: Diagnosis not present

## 2012-05-30 DIAGNOSIS — R0609 Other forms of dyspnea: Secondary | ICD-10-CM | POA: Diagnosis not present

## 2012-05-30 DIAGNOSIS — I359 Nonrheumatic aortic valve disorder, unspecified: Secondary | ICD-10-CM | POA: Diagnosis not present

## 2012-05-31 DIAGNOSIS — N186 End stage renal disease: Secondary | ICD-10-CM | POA: Diagnosis not present

## 2012-05-31 DIAGNOSIS — Z992 Dependence on renal dialysis: Secondary | ICD-10-CM | POA: Diagnosis not present

## 2012-05-31 DIAGNOSIS — I120 Hypertensive chronic kidney disease with stage 5 chronic kidney disease or end stage renal disease: Secondary | ICD-10-CM | POA: Diagnosis not present

## 2012-05-31 DIAGNOSIS — R011 Cardiac murmur, unspecified: Secondary | ICD-10-CM | POA: Diagnosis not present

## 2012-05-31 DIAGNOSIS — R9439 Abnormal result of other cardiovascular function study: Secondary | ICD-10-CM | POA: Diagnosis not present

## 2012-05-31 DIAGNOSIS — I359 Nonrheumatic aortic valve disorder, unspecified: Secondary | ICD-10-CM | POA: Diagnosis not present

## 2012-05-31 DIAGNOSIS — I5040 Unspecified combined systolic (congestive) and diastolic (congestive) heart failure: Secondary | ICD-10-CM | POA: Diagnosis not present

## 2012-05-31 DIAGNOSIS — I110 Hypertensive heart disease with heart failure: Secondary | ICD-10-CM | POA: Diagnosis not present

## 2012-06-01 DIAGNOSIS — D631 Anemia in chronic kidney disease: Secondary | ICD-10-CM | POA: Diagnosis not present

## 2012-06-01 DIAGNOSIS — N186 End stage renal disease: Secondary | ICD-10-CM | POA: Diagnosis not present

## 2012-06-01 DIAGNOSIS — I110 Hypertensive heart disease with heart failure: Secondary | ICD-10-CM | POA: Diagnosis not present

## 2012-06-01 DIAGNOSIS — I359 Nonrheumatic aortic valve disorder, unspecified: Secondary | ICD-10-CM | POA: Diagnosis not present

## 2012-06-01 DIAGNOSIS — I509 Heart failure, unspecified: Secondary | ICD-10-CM | POA: Diagnosis not present

## 2012-06-01 DIAGNOSIS — E8779 Other fluid overload: Secondary | ICD-10-CM | POA: Diagnosis not present

## 2012-06-01 DIAGNOSIS — I2510 Atherosclerotic heart disease of native coronary artery without angina pectoris: Secondary | ICD-10-CM | POA: Diagnosis not present

## 2012-06-01 DIAGNOSIS — I120 Hypertensive chronic kidney disease with stage 5 chronic kidney disease or end stage renal disease: Secondary | ICD-10-CM | POA: Diagnosis not present

## 2012-06-01 DIAGNOSIS — N039 Chronic nephritic syndrome with unspecified morphologic changes: Secondary | ICD-10-CM | POA: Diagnosis not present

## 2012-06-04 DIAGNOSIS — N186 End stage renal disease: Secondary | ICD-10-CM | POA: Diagnosis not present

## 2012-06-05 DIAGNOSIS — Z7901 Long term (current) use of anticoagulants: Secondary | ICD-10-CM

## 2012-06-05 HISTORY — DX: Long term (current) use of anticoagulants: Z79.01

## 2012-06-07 DIAGNOSIS — Z992 Dependence on renal dialysis: Secondary | ICD-10-CM | POA: Diagnosis not present

## 2012-06-07 DIAGNOSIS — N186 End stage renal disease: Secondary | ICD-10-CM | POA: Diagnosis not present

## 2012-06-07 DIAGNOSIS — N2581 Secondary hyperparathyroidism of renal origin: Secondary | ICD-10-CM | POA: Diagnosis not present

## 2012-06-07 DIAGNOSIS — D631 Anemia in chronic kidney disease: Secondary | ICD-10-CM | POA: Diagnosis not present

## 2012-06-07 DIAGNOSIS — D509 Iron deficiency anemia, unspecified: Secondary | ICD-10-CM | POA: Diagnosis not present

## 2012-06-09 DIAGNOSIS — N186 End stage renal disease: Secondary | ICD-10-CM | POA: Diagnosis not present

## 2012-06-09 DIAGNOSIS — E785 Hyperlipidemia, unspecified: Secondary | ICD-10-CM | POA: Diagnosis not present

## 2012-06-11 DIAGNOSIS — N2581 Secondary hyperparathyroidism of renal origin: Secondary | ICD-10-CM | POA: Diagnosis not present

## 2012-06-14 DIAGNOSIS — N186 End stage renal disease: Secondary | ICD-10-CM | POA: Diagnosis present

## 2012-06-14 DIAGNOSIS — J9819 Other pulmonary collapse: Secondary | ICD-10-CM | POA: Diagnosis not present

## 2012-06-14 DIAGNOSIS — D631 Anemia in chronic kidney disease: Secondary | ICD-10-CM | POA: Diagnosis not present

## 2012-06-14 DIAGNOSIS — N039 Chronic nephritic syndrome with unspecified morphologic changes: Secondary | ICD-10-CM | POA: Diagnosis not present

## 2012-06-14 DIAGNOSIS — C8583 Other specified types of non-Hodgkin lymphoma, intra-abdominal lymph nodes: Secondary | ICD-10-CM | POA: Diagnosis present

## 2012-06-14 DIAGNOSIS — E875 Hyperkalemia: Secondary | ICD-10-CM | POA: Diagnosis not present

## 2012-06-14 DIAGNOSIS — I120 Hypertensive chronic kidney disease with stage 5 chronic kidney disease or end stage renal disease: Secondary | ICD-10-CM | POA: Diagnosis present

## 2012-06-14 DIAGNOSIS — I359 Nonrheumatic aortic valve disorder, unspecified: Secondary | ICD-10-CM | POA: Diagnosis not present

## 2012-06-14 DIAGNOSIS — T8610 Unspecified complication of kidney transplant: Secondary | ICD-10-CM | POA: Diagnosis not present

## 2012-06-14 DIAGNOSIS — I509 Heart failure, unspecified: Secondary | ICD-10-CM | POA: Diagnosis not present

## 2012-06-14 DIAGNOSIS — D696 Thrombocytopenia, unspecified: Secondary | ICD-10-CM | POA: Diagnosis not present

## 2012-06-22 ENCOUNTER — Ambulatory Visit: Payer: Self-pay | Admitting: Family Medicine

## 2012-06-23 ENCOUNTER — Encounter: Payer: Self-pay | Admitting: Family Medicine

## 2012-06-23 ENCOUNTER — Ambulatory Visit (INDEPENDENT_AMBULATORY_CARE_PROVIDER_SITE_OTHER): Payer: Medicare Other | Admitting: Family Medicine

## 2012-06-23 VITALS — BP 143/67 | HR 81 | Temp 98.1°F | Ht 71.0 in | Wt 192.2 lb

## 2012-06-23 DIAGNOSIS — N2581 Secondary hyperparathyroidism of renal origin: Secondary | ICD-10-CM | POA: Diagnosis not present

## 2012-06-23 DIAGNOSIS — N186 End stage renal disease: Secondary | ICD-10-CM

## 2012-06-23 DIAGNOSIS — Z5181 Encounter for therapeutic drug level monitoring: Secondary | ICD-10-CM | POA: Diagnosis not present

## 2012-06-23 DIAGNOSIS — N189 Chronic kidney disease, unspecified: Secondary | ICD-10-CM | POA: Diagnosis not present

## 2012-06-23 DIAGNOSIS — Z954 Presence of other heart-valve replacement: Secondary | ICD-10-CM | POA: Diagnosis not present

## 2012-06-23 DIAGNOSIS — Z7901 Long term (current) use of anticoagulants: Secondary | ICD-10-CM | POA: Diagnosis not present

## 2012-06-23 DIAGNOSIS — Z952 Presence of prosthetic heart valve: Secondary | ICD-10-CM | POA: Insufficient documentation

## 2012-06-23 DIAGNOSIS — Z992 Dependence on renal dialysis: Secondary | ICD-10-CM

## 2012-06-23 LAB — POCT INR: INR: 1.9

## 2012-06-23 NOTE — Progress Notes (Signed)
Subjective:     Patient ID: Jeremy [name], male   DOB: 23-Nov-1955, 57 y.o.   MRN: [id number]  HPI Jeremy [name] is new to me. This patient is known to have a history of aortic stenosis. Recently, he underwent aVR surgery at [facility]. On 06/15/2012. He was started 5 days ago on Coumadin 1 mg . 2 days ago his INR was 3.9 and his Coumadin was held. His INR yesterday was much lower and he was told to take 1 mg of Coumadin. He is actually here to get his pro time checked. He actually feels very well. Considering his health problems. Past Medical History  Diagnosis Date  . 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  Marland Kitchen Hx of colonoscopy     2009 by Dr. Laural [name]. Normal except for small submucosal lipoma. No evidence of polyps or colitis.  . Hyperlipidemia   . Hypertension   . Arthritis   . Chronic anticoagulation 06/2012   Past Surgical History  Procedure Laterality Date  . Dg av dialysis  shunt access exist*l* or      left arm  . Back surgery    . Coronary angioplasty with stent placement    . Kidney transplant    . Aortic valve replacement  3 /11 /2014    At [facility]   History   Social History  . Marital Status: Divorced    Spouse Name: N/A    Number of Children: N/A  . Years of Education: N/A   Occupational History  . courier     [employer]   Social History Main Topics  . Smoking status: Never Smoker   . Smokeless tobacco: Not on file  . Alcohol Use: No  . Drug Use: No  . Sexually Active: Not on file   Other Topics Concern  . Not on file   Social History Narrative   Single   1 daughter 55    History reviewed. No pertinent family history. Current Outpatient Prescriptions on File Prior to Visit  Medication Sig Dispense Refill  . aspirin 325 MG  tablet Take 325 mg by mouth daily.      . cinacalcet (SENSIPAR) 30 MG tablet Take 30 mg by mouth daily.        . folic acid-vitamin b complex-vitamin c-selenium-zinc (DIALYVITE) 3 MG TABS Take 1 tablet by mouth daily.        Marland Kitchen labetalol (NORMODYNE) 300 MG tablet Take 300 mg by mouth 2 (two) times daily.        Marland Kitchen lanthanum (FOSRENOL) 500 MG chewable tablet Chew 500 mg by mouth 3 (three) times daily with meals.        Marland Kitchen NIFEdipine (PROCARDIA-XL/ADALAT CC) 60 MG 24 hr tablet Take 60 mg by mouth daily.        . pravastatin (PRAVACHOL) 40 MG tablet Take by mouth daily.        . ramipril (ALTACE) 10 MG capsule Take 10 mg by mouth daily.        . calcium acetate (PHOSLO) 667 MG capsule Take 667 mg by mouth 3 (three) times daily with meals.        . clopidogrel (PLAVIX) 75 MG tablet Take 75 mg by mouth daily.      Marland Kitchen dicyclomine (BENTYL) 10 MG capsule  Take 1 capsule (10 mg total) by mouth 4 (four) times daily -  before meals and at bedtime.  90 capsule  2  . minoxidil (LONITEN) 2.5 MG tablet Take 2.5 mg by mouth daily.        . predniSONE (DELTASONE) 5 MG tablet Take 5 mg by mouth daily.         No current facility-administered medications on file prior to visit.   Allergies  Allergen Reactions  . Lipitor (Atorvastatin Calcium)     Body aches    Immunization History  Administered Date(s) Administered  . Td 04/20/2006   Prior to Admission medications   Medication Sig Start Date End Date Taking? Authorizing Provider  aspirin 325 MG tablet Take 325 mg by mouth daily.   Yes Historical Provider, MD  cinacalcet (SENSIPAR) 30 MG tablet Take 30 mg by mouth daily.     Yes Historical Provider, MD  folic acid-vitamin b complex-vitamin c-selenium-zinc (DIALYVITE) 3 MG TABS Take 1 tablet by mouth daily.     Yes Historical Provider, MD  labetalol (NORMODYNE) 300 MG tablet Take 300 mg by mouth 2 (two) times daily.     Yes Historical Provider, MD  lanthanum (FOSRENOL) 500 MG chewable tablet Chew 500 mg by  mouth 3 (three) times daily with meals.     Yes Historical Provider, MD  NIFEdipine (PROCARDIA-XL/ADALAT CC) 60 MG 24 hr tablet Take 60 mg by mouth daily.     Yes Historical Provider, MD  polyethylene glycol powder (GLYCOLAX/MIRALAX) powder  06/21/12  Yes Historical Provider, MD  pravastatin (PRAVACHOL) 40 MG tablet Take by mouth daily.     Yes Historical Provider, MD  ramipril (ALTACE) 10 MG capsule Take 10 mg by mouth daily.     Yes Historical Provider, MD  traMADol Veatrice Bourbon) 50 MG tablet  06/21/12  Yes Historical Provider, MD  warfarin (COUMADIN) 1 MG tablet  06/21/12  Yes Historical Provider, MD  calcium acetate (PHOSLO) 667 MG capsule Take 667 mg by mouth 3 (three) times daily with meals.      Historical Provider, MD  clopidogrel (PLAVIX) 75 MG tablet Take 75 mg by mouth daily.    Historical Provider, MD  dicyclomine (BENTYL) 10 MG capsule Take 1 capsule (10 mg total) by mouth 4 (four) times daily -  before meals and at bedtime. 08/13/11 08/12/12  Butch Penny, NP  minoxidil (LONITEN) 2.5 MG tablet Take 2.5 mg by mouth daily.      Historical Provider, MD  predniSONE (DELTASONE) 5 MG tablet Take 5 mg by mouth daily.      Historical Provider, MD     Review of Systems  All other systems reviewed and are negative.       Objective:   Physical Exam BP 143/67  Pulse 81  Temp(Src) 98.1 F (36.7 C) (Oral)  Ht 5\' 11"  (1.803 m)  Wt 192 lb 3.2 oz (87.181 kg)  BMI 26.82 kg/m2 On examination he appeared in no acute distress. Vital signs as documented.  Skin warm and dry and without overt rashes.  Head &Neck without JVD. Normal. Lungs clear. Few crackles in the left bases which I suspect is associated with atelectasis post operative period the  Heart exam notable for regular rhythm, aVR valve is audible and functional. sounds and there are no rubs or gallops. The JVD is down. His chest wound is healing nicely with surgical glue applied to the surface of the wound Abdomen unremarkable and without  evidence of organomegaly, masses, or  abdominal aortic enlargement.  Extremities nonedematous.   his left forearm reveals the AV shunt for dialysis. Assessment:    here for anticoagulation monitoring and adjustment of medications    Status post AVR surgery.   Plan:     Protime was ordered and adjustment to his medications to be made pending the results today. Patient continues to attend dialysis. We'll get him into the coagulation management clinic with one of the pharmacist. Return to see his physician in 6 weeks     [place]. Jacelyn Grip, M.D.

## 2012-06-25 ENCOUNTER — Ambulatory Visit (INDEPENDENT_AMBULATORY_CARE_PROVIDER_SITE_OTHER): Payer: Medicare Other | Admitting: Family Medicine

## 2012-06-25 ENCOUNTER — Encounter: Payer: Self-pay | Admitting: Family Medicine

## 2012-06-25 VITALS — BP 93/41 | HR 73 | Temp 98.6°F | Wt 192.2 lb

## 2012-06-25 DIAGNOSIS — D631 Anemia in chronic kidney disease: Secondary | ICD-10-CM | POA: Diagnosis not present

## 2012-06-25 DIAGNOSIS — N2581 Secondary hyperparathyroidism of renal origin: Secondary | ICD-10-CM | POA: Diagnosis not present

## 2012-06-25 DIAGNOSIS — Z5181 Encounter for therapeutic drug level monitoring: Secondary | ICD-10-CM | POA: Diagnosis not present

## 2012-06-25 DIAGNOSIS — D509 Iron deficiency anemia, unspecified: Secondary | ICD-10-CM | POA: Diagnosis not present

## 2012-06-25 DIAGNOSIS — Z954 Presence of other heart-valve replacement: Secondary | ICD-10-CM | POA: Diagnosis not present

## 2012-06-25 DIAGNOSIS — Z7901 Long term (current) use of anticoagulants: Secondary | ICD-10-CM | POA: Diagnosis not present

## 2012-06-25 DIAGNOSIS — N186 End stage renal disease: Secondary | ICD-10-CM | POA: Diagnosis not present

## 2012-06-25 DIAGNOSIS — Z992 Dependence on renal dialysis: Secondary | ICD-10-CM | POA: Diagnosis not present

## 2012-06-25 DIAGNOSIS — Z952 Presence of prosthetic heart valve: Secondary | ICD-10-CM

## 2012-06-25 LAB — POCT INR: INR: 2.3

## 2012-06-26 NOTE — Progress Notes (Signed)
Subjective:     Patient ID: Jeremy [name], male   DOB: Sep 10, 1955, 57 y.o.   MRN: [id number]  HPI   Review of Systems     Objective:   Physical Exam     Assessment:        Plan:         Shanara Schnieders P. Jacelyn Grip, M.D.

## 2012-06-28 ENCOUNTER — Ambulatory Visit (INDEPENDENT_AMBULATORY_CARE_PROVIDER_SITE_OTHER): Payer: Medicare Other | Admitting: Pharmacist

## 2012-06-28 DIAGNOSIS — Z5181 Encounter for therapeutic drug level monitoring: Secondary | ICD-10-CM | POA: Diagnosis not present

## 2012-06-28 DIAGNOSIS — Z7901 Long term (current) use of anticoagulants: Secondary | ICD-10-CM

## 2012-06-28 DIAGNOSIS — N039 Chronic nephritic syndrome with unspecified morphologic changes: Secondary | ICD-10-CM | POA: Diagnosis not present

## 2012-06-28 DIAGNOSIS — Z992 Dependence on renal dialysis: Secondary | ICD-10-CM | POA: Diagnosis not present

## 2012-06-28 DIAGNOSIS — Z954 Presence of other heart-valve replacement: Secondary | ICD-10-CM

## 2012-06-28 DIAGNOSIS — N2581 Secondary hyperparathyroidism of renal origin: Secondary | ICD-10-CM | POA: Diagnosis not present

## 2012-06-28 DIAGNOSIS — N186 End stage renal disease: Secondary | ICD-10-CM | POA: Diagnosis not present

## 2012-06-28 DIAGNOSIS — D509 Iron deficiency anemia, unspecified: Secondary | ICD-10-CM | POA: Diagnosis not present

## 2012-06-28 DIAGNOSIS — Z952 Presence of prosthetic heart valve: Secondary | ICD-10-CM

## 2012-06-28 LAB — POCT INR: INR: 2

## 2012-06-30 DIAGNOSIS — D631 Anemia in chronic kidney disease: Secondary | ICD-10-CM | POA: Diagnosis not present

## 2012-06-30 DIAGNOSIS — N2581 Secondary hyperparathyroidism of renal origin: Secondary | ICD-10-CM | POA: Diagnosis not present

## 2012-06-30 DIAGNOSIS — Z992 Dependence on renal dialysis: Secondary | ICD-10-CM | POA: Diagnosis not present

## 2012-06-30 DIAGNOSIS — N186 End stage renal disease: Secondary | ICD-10-CM | POA: Diagnosis not present

## 2012-06-30 DIAGNOSIS — N039 Chronic nephritic syndrome with unspecified morphologic changes: Secondary | ICD-10-CM | POA: Diagnosis not present

## 2012-06-30 DIAGNOSIS — D509 Iron deficiency anemia, unspecified: Secondary | ICD-10-CM | POA: Diagnosis not present

## 2012-07-01 DIAGNOSIS — E041 Nontoxic single thyroid nodule: Secondary | ICD-10-CM | POA: Diagnosis not present

## 2012-07-02 DIAGNOSIS — N186 End stage renal disease: Secondary | ICD-10-CM | POA: Diagnosis not present

## 2012-07-02 DIAGNOSIS — D509 Iron deficiency anemia, unspecified: Secondary | ICD-10-CM | POA: Diagnosis not present

## 2012-07-02 DIAGNOSIS — N2581 Secondary hyperparathyroidism of renal origin: Secondary | ICD-10-CM | POA: Diagnosis not present

## 2012-07-02 DIAGNOSIS — Z992 Dependence on renal dialysis: Secondary | ICD-10-CM | POA: Diagnosis not present

## 2012-07-02 DIAGNOSIS — N039 Chronic nephritic syndrome with unspecified morphologic changes: Secondary | ICD-10-CM | POA: Diagnosis not present

## 2012-07-05 ENCOUNTER — Ambulatory Visit (INDEPENDENT_AMBULATORY_CARE_PROVIDER_SITE_OTHER): Payer: Medicare Other | Admitting: Pharmacist

## 2012-07-05 DIAGNOSIS — Z952 Presence of prosthetic heart valve: Secondary | ICD-10-CM

## 2012-07-05 DIAGNOSIS — Z954 Presence of other heart-valve replacement: Secondary | ICD-10-CM | POA: Diagnosis not present

## 2012-07-05 DIAGNOSIS — D509 Iron deficiency anemia, unspecified: Secondary | ICD-10-CM | POA: Diagnosis not present

## 2012-07-05 DIAGNOSIS — N186 End stage renal disease: Secondary | ICD-10-CM | POA: Diagnosis not present

## 2012-07-05 DIAGNOSIS — Z5181 Encounter for therapeutic drug level monitoring: Secondary | ICD-10-CM | POA: Diagnosis not present

## 2012-07-05 DIAGNOSIS — Z992 Dependence on renal dialysis: Secondary | ICD-10-CM | POA: Diagnosis not present

## 2012-07-05 DIAGNOSIS — N039 Chronic nephritic syndrome with unspecified morphologic changes: Secondary | ICD-10-CM | POA: Diagnosis not present

## 2012-07-05 DIAGNOSIS — Z7901 Long term (current) use of anticoagulants: Secondary | ICD-10-CM

## 2012-07-05 DIAGNOSIS — N2581 Secondary hyperparathyroidism of renal origin: Secondary | ICD-10-CM | POA: Diagnosis not present

## 2012-07-05 LAB — POCT INR: INR: 2.6

## 2012-07-07 DIAGNOSIS — N039 Chronic nephritic syndrome with unspecified morphologic changes: Secondary | ICD-10-CM | POA: Diagnosis not present

## 2012-07-07 DIAGNOSIS — Z992 Dependence on renal dialysis: Secondary | ICD-10-CM | POA: Diagnosis not present

## 2012-07-07 DIAGNOSIS — D509 Iron deficiency anemia, unspecified: Secondary | ICD-10-CM | POA: Diagnosis not present

## 2012-07-07 DIAGNOSIS — N2581 Secondary hyperparathyroidism of renal origin: Secondary | ICD-10-CM | POA: Diagnosis not present

## 2012-07-07 DIAGNOSIS — N186 End stage renal disease: Secondary | ICD-10-CM | POA: Diagnosis not present

## 2012-07-09 DIAGNOSIS — Z992 Dependence on renal dialysis: Secondary | ICD-10-CM | POA: Diagnosis not present

## 2012-07-09 DIAGNOSIS — N186 End stage renal disease: Secondary | ICD-10-CM | POA: Diagnosis not present

## 2012-07-09 DIAGNOSIS — N2581 Secondary hyperparathyroidism of renal origin: Secondary | ICD-10-CM | POA: Diagnosis not present

## 2012-07-09 DIAGNOSIS — N039 Chronic nephritic syndrome with unspecified morphologic changes: Secondary | ICD-10-CM | POA: Diagnosis not present

## 2012-07-09 DIAGNOSIS — D509 Iron deficiency anemia, unspecified: Secondary | ICD-10-CM | POA: Diagnosis not present

## 2012-07-12 DIAGNOSIS — D509 Iron deficiency anemia, unspecified: Secondary | ICD-10-CM | POA: Diagnosis not present

## 2012-07-12 DIAGNOSIS — N186 End stage renal disease: Secondary | ICD-10-CM | POA: Diagnosis not present

## 2012-07-12 DIAGNOSIS — D631 Anemia in chronic kidney disease: Secondary | ICD-10-CM | POA: Diagnosis not present

## 2012-07-12 DIAGNOSIS — Z992 Dependence on renal dialysis: Secondary | ICD-10-CM | POA: Diagnosis not present

## 2012-07-12 DIAGNOSIS — N2581 Secondary hyperparathyroidism of renal origin: Secondary | ICD-10-CM | POA: Diagnosis not present

## 2012-07-14 DIAGNOSIS — N2581 Secondary hyperparathyroidism of renal origin: Secondary | ICD-10-CM | POA: Diagnosis not present

## 2012-07-14 DIAGNOSIS — N186 End stage renal disease: Secondary | ICD-10-CM | POA: Diagnosis not present

## 2012-07-14 DIAGNOSIS — D509 Iron deficiency anemia, unspecified: Secondary | ICD-10-CM | POA: Diagnosis not present

## 2012-07-14 DIAGNOSIS — N039 Chronic nephritic syndrome with unspecified morphologic changes: Secondary | ICD-10-CM | POA: Diagnosis not present

## 2012-07-14 DIAGNOSIS — Z992 Dependence on renal dialysis: Secondary | ICD-10-CM | POA: Diagnosis not present

## 2012-07-15 DIAGNOSIS — I2510 Atherosclerotic heart disease of native coronary artery without angina pectoris: Secondary | ICD-10-CM | POA: Diagnosis not present

## 2012-07-15 DIAGNOSIS — I10 Essential (primary) hypertension: Secondary | ICD-10-CM | POA: Diagnosis not present

## 2012-07-16 DIAGNOSIS — N2581 Secondary hyperparathyroidism of renal origin: Secondary | ICD-10-CM | POA: Diagnosis not present

## 2012-07-16 DIAGNOSIS — N186 End stage renal disease: Secondary | ICD-10-CM | POA: Diagnosis not present

## 2012-07-16 DIAGNOSIS — D631 Anemia in chronic kidney disease: Secondary | ICD-10-CM | POA: Diagnosis not present

## 2012-07-16 DIAGNOSIS — D509 Iron deficiency anemia, unspecified: Secondary | ICD-10-CM | POA: Diagnosis not present

## 2012-07-16 DIAGNOSIS — Z992 Dependence on renal dialysis: Secondary | ICD-10-CM | POA: Diagnosis not present

## 2012-07-19 ENCOUNTER — Ambulatory Visit (INDEPENDENT_AMBULATORY_CARE_PROVIDER_SITE_OTHER): Payer: Medicare Other | Admitting: Pharmacist

## 2012-07-19 DIAGNOSIS — Z992 Dependence on renal dialysis: Secondary | ICD-10-CM | POA: Diagnosis not present

## 2012-07-19 DIAGNOSIS — Z954 Presence of other heart-valve replacement: Secondary | ICD-10-CM | POA: Diagnosis not present

## 2012-07-19 DIAGNOSIS — Z7901 Long term (current) use of anticoagulants: Secondary | ICD-10-CM

## 2012-07-19 DIAGNOSIS — D631 Anemia in chronic kidney disease: Secondary | ICD-10-CM | POA: Diagnosis not present

## 2012-07-19 DIAGNOSIS — Z5181 Encounter for therapeutic drug level monitoring: Secondary | ICD-10-CM

## 2012-07-19 DIAGNOSIS — N2581 Secondary hyperparathyroidism of renal origin: Secondary | ICD-10-CM | POA: Diagnosis not present

## 2012-07-19 DIAGNOSIS — N186 End stage renal disease: Secondary | ICD-10-CM | POA: Diagnosis not present

## 2012-07-19 DIAGNOSIS — D509 Iron deficiency anemia, unspecified: Secondary | ICD-10-CM | POA: Diagnosis not present

## 2012-07-19 DIAGNOSIS — Z952 Presence of prosthetic heart valve: Secondary | ICD-10-CM

## 2012-07-19 LAB — POCT INR: INR: 1.6

## 2012-07-21 DIAGNOSIS — Z992 Dependence on renal dialysis: Secondary | ICD-10-CM | POA: Diagnosis not present

## 2012-07-21 DIAGNOSIS — N2581 Secondary hyperparathyroidism of renal origin: Secondary | ICD-10-CM | POA: Diagnosis not present

## 2012-07-21 DIAGNOSIS — N186 End stage renal disease: Secondary | ICD-10-CM | POA: Diagnosis not present

## 2012-07-21 DIAGNOSIS — N039 Chronic nephritic syndrome with unspecified morphologic changes: Secondary | ICD-10-CM | POA: Diagnosis not present

## 2012-07-21 DIAGNOSIS — D509 Iron deficiency anemia, unspecified: Secondary | ICD-10-CM | POA: Diagnosis not present

## 2012-07-22 ENCOUNTER — Telehealth: Payer: Self-pay | Admitting: Family Medicine

## 2012-07-22 NOTE — Telephone Encounter (Signed)
Made pt appt for 04/21

## 2012-07-23 DIAGNOSIS — N039 Chronic nephritic syndrome with unspecified morphologic changes: Secondary | ICD-10-CM | POA: Diagnosis not present

## 2012-07-23 DIAGNOSIS — D509 Iron deficiency anemia, unspecified: Secondary | ICD-10-CM | POA: Diagnosis not present

## 2012-07-23 DIAGNOSIS — N186 End stage renal disease: Secondary | ICD-10-CM | POA: Diagnosis not present

## 2012-07-23 DIAGNOSIS — N2581 Secondary hyperparathyroidism of renal origin: Secondary | ICD-10-CM | POA: Diagnosis not present

## 2012-07-23 DIAGNOSIS — Z992 Dependence on renal dialysis: Secondary | ICD-10-CM | POA: Diagnosis not present

## 2012-07-26 ENCOUNTER — Ambulatory Visit (INDEPENDENT_AMBULATORY_CARE_PROVIDER_SITE_OTHER): Payer: Medicare Other | Admitting: Family Medicine

## 2012-07-26 ENCOUNTER — Encounter: Payer: Self-pay | Admitting: Family Medicine

## 2012-07-26 VITALS — BP 160/70 | HR 71 | Temp 98.0°F | Ht 70.0 in | Wt 188.8 lb

## 2012-07-26 DIAGNOSIS — D509 Iron deficiency anemia, unspecified: Secondary | ICD-10-CM | POA: Diagnosis not present

## 2012-07-26 DIAGNOSIS — Z992 Dependence on renal dialysis: Secondary | ICD-10-CM

## 2012-07-26 DIAGNOSIS — E785 Hyperlipidemia, unspecified: Secondary | ICD-10-CM | POA: Insufficient documentation

## 2012-07-26 DIAGNOSIS — I10 Essential (primary) hypertension: Secondary | ICD-10-CM | POA: Diagnosis not present

## 2012-07-26 DIAGNOSIS — Z954 Presence of other heart-valve replacement: Secondary | ICD-10-CM

## 2012-07-26 DIAGNOSIS — N186 End stage renal disease: Secondary | ICD-10-CM | POA: Insufficient documentation

## 2012-07-26 DIAGNOSIS — N2581 Secondary hyperparathyroidism of renal origin: Secondary | ICD-10-CM | POA: Diagnosis not present

## 2012-07-26 DIAGNOSIS — D631 Anemia in chronic kidney disease: Secondary | ICD-10-CM | POA: Diagnosis not present

## 2012-07-26 DIAGNOSIS — Z952 Presence of prosthetic heart valve: Secondary | ICD-10-CM

## 2012-07-26 MED ORDER — LABETALOL HCL 300 MG PO TABS: 600.0000 mg | ORAL_TABLET | Freq: Two times a day (BID) | ORAL | Status: DC

## 2012-07-26 MED ORDER — NITROGLYCERIN 0.4 MG SL SUBL: 0.4000 mg | SUBLINGUAL_TABLET | SUBLINGUAL | Status: DC | PRN

## 2012-07-26 MED ORDER — WARFARIN SODIUM 1 MG PO TABS: 1.0000 mg | ORAL_TABLET | Freq: Two times a day (BID) | ORAL | Status: DC

## 2012-07-26 MED ORDER — CLONIDINE HCL 0.1 MG PO TABS: 0.1000 mg | ORAL_TABLET | Freq: Two times a day (BID) | ORAL | Status: DC

## 2012-07-26 NOTE — Progress Notes (Signed)
Patient ID: Jeremy [name], male   DOB: [birth year], 57 y.o.   MRN: [id number] SUBJECTIVE: HPI: Patient is here for follow up of hypertension: denies Headache;deniesChest Pain;denies weakness;denies Shortness of Breath or Orthopnea;denies Visual changes;denies palpitations;denies cough;denies pedal edema;denies symptoms of TIA or stroke; admits to Compliance with medications. denies Problems with medications. Meds adjusted by Valve surgeons, they added clonidine 0.1 mg twice  A day and if his BP is low he should hold the clonidine. However this regimen causes fluctuations in his BP. He brought his BP readings.   PMH/PSH: reviewed/updated in Epic  SH/FH: reviewed/updated in Epic  Allergies: reviewed/updated in Epic  Medications: reviewed/updated in Epic  Immunizations: reviewed/updated in Epic  ROS: As above in the HPI. All other systems are stable or negative.  OBJECTIVE: APPEARANCE:  Patient in no acute distress.The patient appeared well nourished and normally developed. Acyanotic. Waist: VITAL SIGNS:BP 160/70  Pulse 71  Temp(Src) 98 F (36.7 C) (Oral)  Ht 5\' 10"  (1.778 m)  Wt 188 lb 12.8 oz (85.639 kg)  BMI 27.09 kg/m2   SKIN: warm and  Dry without overt rashes, tattoos and scars  HEAD and Neck: without JVD, Head and scalp: normal Eyes:No scleral icterus. Fundi normal, eye movements normal. Ears: Auricle normal, canal normal, Tympanic membranes normal, insufflation normal. Nose: normal Throat: normal Neck & thyroid: normal  CHEST & LUNGS: Chest wall: normal Lungs: Clear  CVS: Reveals the PMI to be normally located. Regular rhythm, First and Second Heart sounds arec/w with his AVR.  murmurs no change no rubs nor gallops. Peripheral vasculature: Shunt intact in the left distal forearm.  ABDOMEN:  Appearance: normal Benign,, no organomegaly, no masses, no Abdominal Aortic enlargement. No Guarding , no rebound. No Bruits. Bowel sounds: normal  RECTAL: N/A GU:  N/A  EXTREMETIES: nonedematous.  NEUROLOGIC: oriented to time,place and person; nonfocal. Strength is normal Sensory is normal Reflexes are normal Cranial Nerves are normal.  ASSESSMENT: HTN (hypertension)  S/P AVR (aortic valve replacement)  HLD (hyperlipidemia)  CKD (chronic kidney disease) stage V requiring chronic dialysis    PLAN: No orders of the defined types were placed in this encounter.   No results found for this or any previous visit (from the past 24 hour(s)). Meds ordered this encounter  Medications  . labetalol (NORMODYNE) 300 MG tablet    Sig: Take 2 tablets (600 mg total) by mouth 2 (two) times daily.    Dispense:  120 tablet    Refill:  3  . warfarin (COUMADIN) 1 MG tablet    Sig: Take 1 tablet (1 mg total) by mouth 2 (two) times daily.    Dispense:  60 tablet    Refill:  3  . cloNIDine (CATAPRES) 0.1 MG tablet    Sig: Take 1 tablet (0.1 mg total) by mouth 2 (two) times daily. As needed for elevated BP    Dispense:  60 tablet    Refill:  5  . nitroGLYCERIN (NITROSTAT) 0.4 MG SL tablet    Sig: Place 1 tablet (0.4 mg total) under the tongue every 5 (five) minutes as needed for chest pain.    Dispense:  25 tablet    Refill:  3  refilled his NTC to have on hand for emergencies. Use the clonidine consistently and increased the labetalol to smooth out his BP control. He has an appointment for next week for PT/INR, and should have his BP checked then. Continue Dialysis.  Amisadai Woodford P. Jacelyn Grip, M.D.

## 2012-07-28 DIAGNOSIS — Z992 Dependence on renal dialysis: Secondary | ICD-10-CM | POA: Diagnosis not present

## 2012-07-28 DIAGNOSIS — N186 End stage renal disease: Secondary | ICD-10-CM | POA: Diagnosis not present

## 2012-07-28 DIAGNOSIS — N2581 Secondary hyperparathyroidism of renal origin: Secondary | ICD-10-CM | POA: Diagnosis not present

## 2012-07-28 DIAGNOSIS — D509 Iron deficiency anemia, unspecified: Secondary | ICD-10-CM | POA: Diagnosis not present

## 2012-07-28 DIAGNOSIS — D631 Anemia in chronic kidney disease: Secondary | ICD-10-CM | POA: Diagnosis not present

## 2012-07-30 DIAGNOSIS — Z992 Dependence on renal dialysis: Secondary | ICD-10-CM | POA: Diagnosis not present

## 2012-07-30 DIAGNOSIS — N2581 Secondary hyperparathyroidism of renal origin: Secondary | ICD-10-CM | POA: Diagnosis not present

## 2012-07-30 DIAGNOSIS — D631 Anemia in chronic kidney disease: Secondary | ICD-10-CM | POA: Diagnosis not present

## 2012-07-30 DIAGNOSIS — N186 End stage renal disease: Secondary | ICD-10-CM | POA: Diagnosis not present

## 2012-07-30 DIAGNOSIS — D509 Iron deficiency anemia, unspecified: Secondary | ICD-10-CM | POA: Diagnosis not present

## 2012-08-02 ENCOUNTER — Ambulatory Visit (INDEPENDENT_AMBULATORY_CARE_PROVIDER_SITE_OTHER): Payer: Medicare Other | Admitting: Pharmacist

## 2012-08-02 ENCOUNTER — Encounter: Payer: Self-pay | Admitting: Pharmacist

## 2012-08-02 VITALS — BP 138/56 | HR 68

## 2012-08-02 DIAGNOSIS — Z954 Presence of other heart-valve replacement: Secondary | ICD-10-CM

## 2012-08-02 DIAGNOSIS — Z7901 Long term (current) use of anticoagulants: Secondary | ICD-10-CM | POA: Diagnosis not present

## 2012-08-02 DIAGNOSIS — Z952 Presence of prosthetic heart valve: Secondary | ICD-10-CM

## 2012-08-02 DIAGNOSIS — D631 Anemia in chronic kidney disease: Secondary | ICD-10-CM | POA: Diagnosis not present

## 2012-08-02 DIAGNOSIS — N2581 Secondary hyperparathyroidism of renal origin: Secondary | ICD-10-CM | POA: Diagnosis not present

## 2012-08-02 DIAGNOSIS — D509 Iron deficiency anemia, unspecified: Secondary | ICD-10-CM | POA: Diagnosis not present

## 2012-08-02 DIAGNOSIS — Z992 Dependence on renal dialysis: Secondary | ICD-10-CM | POA: Diagnosis not present

## 2012-08-02 DIAGNOSIS — Z5181 Encounter for therapeutic drug level monitoring: Secondary | ICD-10-CM | POA: Diagnosis not present

## 2012-08-02 DIAGNOSIS — N186 End stage renal disease: Secondary | ICD-10-CM | POA: Diagnosis not present

## 2012-08-02 LAB — POCT INR: INR: 2.4

## 2012-08-04 ENCOUNTER — Telehealth: Payer: Self-pay | Admitting: Family Medicine

## 2012-08-04 DIAGNOSIS — N186 End stage renal disease: Secondary | ICD-10-CM | POA: Diagnosis not present

## 2012-08-04 DIAGNOSIS — Z992 Dependence on renal dialysis: Secondary | ICD-10-CM | POA: Diagnosis not present

## 2012-08-04 DIAGNOSIS — D509 Iron deficiency anemia, unspecified: Secondary | ICD-10-CM | POA: Diagnosis not present

## 2012-08-04 DIAGNOSIS — D631 Anemia in chronic kidney disease: Secondary | ICD-10-CM | POA: Diagnosis not present

## 2012-08-04 DIAGNOSIS — N2581 Secondary hyperparathyroidism of renal origin: Secondary | ICD-10-CM | POA: Diagnosis not present

## 2012-08-04 NOTE — Telephone Encounter (Signed)
appt made for this Friday with mae, didn't want to wait on wong's first avalible

## 2012-08-06 ENCOUNTER — Ambulatory Visit (INDEPENDENT_AMBULATORY_CARE_PROVIDER_SITE_OTHER): Payer: Medicare Other | Admitting: General Practice

## 2012-08-06 ENCOUNTER — Encounter: Payer: Self-pay | Admitting: General Practice

## 2012-08-06 VITALS — BP 179/79 | HR 68 | Temp 97.7°F | Ht 70.0 in | Wt 189.0 lb

## 2012-08-06 DIAGNOSIS — N2581 Secondary hyperparathyroidism of renal origin: Secondary | ICD-10-CM | POA: Diagnosis not present

## 2012-08-06 DIAGNOSIS — Z952 Presence of prosthetic heart valve: Secondary | ICD-10-CM

## 2012-08-06 DIAGNOSIS — Z954 Presence of other heart-valve replacement: Secondary | ICD-10-CM

## 2012-08-06 DIAGNOSIS — Z5181 Encounter for therapeutic drug level monitoring: Secondary | ICD-10-CM

## 2012-08-06 DIAGNOSIS — Z7901 Long term (current) use of anticoagulants: Secondary | ICD-10-CM | POA: Diagnosis not present

## 2012-08-06 DIAGNOSIS — G5700 Lesion of sciatic nerve, unspecified lower limb: Secondary | ICD-10-CM | POA: Diagnosis not present

## 2012-08-06 DIAGNOSIS — N039 Chronic nephritic syndrome with unspecified morphologic changes: Secondary | ICD-10-CM | POA: Diagnosis not present

## 2012-08-06 DIAGNOSIS — N186 End stage renal disease: Secondary | ICD-10-CM | POA: Diagnosis not present

## 2012-08-06 DIAGNOSIS — G5701 Lesion of sciatic nerve, right lower limb: Secondary | ICD-10-CM

## 2012-08-06 DIAGNOSIS — D509 Iron deficiency anemia, unspecified: Secondary | ICD-10-CM | POA: Diagnosis not present

## 2012-08-06 DIAGNOSIS — Z992 Dependence on renal dialysis: Secondary | ICD-10-CM | POA: Diagnosis not present

## 2012-08-06 LAB — POCT INR: INR: 1.7

## 2012-08-06 MED ORDER — HYDROCODONE-ACETAMINOPHEN 5-325 MG PO TABS: 1.0000 | ORAL_TABLET | Freq: Three times a day (TID) | ORAL | Status: DC | PRN

## 2012-08-06 NOTE — Addendum Note (Signed)
Addended by: Wyline Mood on: 08/06/2012 04:38 PM   Modules accepted: Orders

## 2012-08-06 NOTE — Progress Notes (Signed)
  Subjective:    Patient ID: Jeremy [name], male    DOB: 02-May-1955, 57 y.o.   MRN: [id number]  HPI Reports having back pain that radiates down right leg. Reports onset was several years ago and worsened over past year. Reports seeing orthopaedic in the past and no treatment administered due to being on plavix. Reports taking vicodin in the past that gave moderate relief. Reports wanting a referral to ortho in [place]. Denies taking OTC medications and ultram doesn't help with pain.    Review of Systems  Constitutional: Negative for fever.  Respiratory: Negative for chest tightness and shortness of breath.   Cardiovascular: Negative for chest pain.  Genitourinary:       One hemodialysis  Musculoskeletal: Positive for back pain.       Hsitory of chronic  Skin: Negative.   Neurological: Negative for dizziness and headaches.  Psychiatric/Behavioral: Negative.        Objective:   Physical Exam  Constitutional: He is oriented to person, place, and time. He appears well-developed and well-nourished.  Cardiovascular: Normal rate, regular rhythm and normal heart sounds.   Pulmonary/Chest: Effort normal and breath sounds normal. No respiratory distress. He exhibits no tenderness.  Neurological: He is alert and oriented to person, place, and time.  Skin: Skin is warm and dry.  Psychiatric: He has a normal mood and affect.          Assessment & Plan:  Take medications as prescribed Use moist heat on 15 mins then off 45 mins three times daily Rest area as much as possible RTO if symptoms worsen Patient verbalized understanding Almyra Free, FNP-C

## 2012-08-06 NOTE — Patient Instructions (Addendum)
Sciatica Sciatica is pain, weakness, numbness, or tingling along the path of the sciatic nerve. The nerve starts in the lower back and runs down the back of each leg. The nerve controls the muscles in the lower leg and in the back of the knee, while also providing sensation to the back of the thigh, lower leg, and the sole of your foot. Sciatica is a symptom of another medical condition. For instance, nerve damage or certain conditions, such as a herniated disk or bone spur on the spine, pinch or put pressure on the sciatic nerve. This causes the pain, weakness, or other sensations normally associated with sciatica. Generally, sciatica only affects one side of the body. CAUSES   Herniated or slipped disc.  Degenerative disk disease.  A pain disorder involving the narrow muscle in the buttocks (piriformis syndrome).  Pelvic injury or fracture.  Pregnancy.  Tumor (rare). SYMPTOMS  Symptoms can vary from mild to very severe. The symptoms usually travel from the low back to the buttocks and down the back of the leg. Symptoms can include:  Mild tingling or dull aches in the lower back, leg, or hip.  Numbness in the back of the calf or sole of the foot.  Burning sensations in the lower back, leg, or hip.  Sharp pains in the lower back, leg, or hip.  Leg weakness.  Severe back pain inhibiting movement. These symptoms may get worse with coughing, sneezing, laughing, or prolonged sitting or standing. Also, being overweight may worsen symptoms. DIAGNOSIS  Your caregiver will perform a physical exam to look for common symptoms of sciatica. He or she may ask you to do certain movements or activities that would trigger sciatic nerve pain. Other tests may be performed to find the cause of the sciatica. These may include:  Blood tests.  X-rays.  Imaging tests, such as an MRI or CT scan. TREATMENT  Treatment is directed at the cause of the sciatic pain. Sometimes, treatment is not necessary  and the pain and discomfort goes away on its own. If treatment is needed, your caregiver may suggest:  Over-the-counter medicines to relieve pain.  Prescription medicines, such as anti-inflammatory medicine, muscle relaxants, or narcotics.  Applying heat or ice to the painful area.  Steroid injections to lessen pain, irritation, and inflammation around the nerve.  Reducing activity during periods of pain.  Exercising and stretching to strengthen your abdomen and improve flexibility of your spine. Your caregiver may suggest losing weight if the extra weight makes the back pain worse.  Physical therapy.  Surgery to eliminate what is pressing or pinching the nerve, such as a bone spur or part of a herniated disk. HOME CARE INSTRUCTIONS   Only take over-the-counter or prescription medicines for pain or discomfort as directed by your caregiver.  Apply ice to the affected area for 20 minutes, 3 4 times a day for the first 48 72 hours. Then try heat in the same way.  Exercise, stretch, or perform your usual activities if these do not aggravate your pain.  Attend physical therapy sessions as directed by your caregiver.  Keep all follow-up appointments as directed by your caregiver.  Do not wear high heels or shoes that do not provide proper support.  Check your mattress to see if it is too soft. A firm mattress may lessen your pain and discomfort. SEEK IMMEDIATE MEDICAL CARE IF:   You lose control of your bowel or bladder (incontinence).  You have increasing weakness in the lower back,   pelvis, buttocks, or legs.  You have redness or swelling of your back.  You have a burning sensation when you urinate.  You have pain that gets worse when you lie down or awakens you at night.  Your pain is worse than you have experienced in the past.  Your pain is lasting longer than 4 weeks.  You are suddenly losing weight without reason. MAKE SURE YOU:  Understand these  instructions.  Will watch your condition.  Will get help right away if you are not doing well or get worse. Document Released: 03/18/2001 Document Revised: 09/23/2011 Document Reviewed: 08/03/2011 [facility] Patient Information 2013 [place]. Coumadin 1mg  2 PO today and tomorrow and recheck Monday.

## 2012-08-09 DIAGNOSIS — D509 Iron deficiency anemia, unspecified: Secondary | ICD-10-CM | POA: Diagnosis not present

## 2012-08-09 DIAGNOSIS — N186 End stage renal disease: Secondary | ICD-10-CM | POA: Diagnosis not present

## 2012-08-09 DIAGNOSIS — Z992 Dependence on renal dialysis: Secondary | ICD-10-CM | POA: Diagnosis not present

## 2012-08-09 DIAGNOSIS — D631 Anemia in chronic kidney disease: Secondary | ICD-10-CM | POA: Diagnosis not present

## 2012-08-09 DIAGNOSIS — N2581 Secondary hyperparathyroidism of renal origin: Secondary | ICD-10-CM | POA: Diagnosis not present

## 2012-08-10 ENCOUNTER — Ambulatory Visit (INDEPENDENT_AMBULATORY_CARE_PROVIDER_SITE_OTHER): Payer: Medicare Other | Admitting: Pharmacist Clinician (PhC)/ Clinical Pharmacy Specialist

## 2012-08-10 DIAGNOSIS — Z954 Presence of other heart-valve replacement: Secondary | ICD-10-CM

## 2012-08-10 DIAGNOSIS — Z5181 Encounter for therapeutic drug level monitoring: Secondary | ICD-10-CM | POA: Diagnosis not present

## 2012-08-10 DIAGNOSIS — Z7901 Long term (current) use of anticoagulants: Secondary | ICD-10-CM

## 2012-08-10 DIAGNOSIS — Z952 Presence of prosthetic heart valve: Secondary | ICD-10-CM

## 2012-08-10 LAB — POCT INR: INR: 1.9

## 2012-08-11 DIAGNOSIS — D509 Iron deficiency anemia, unspecified: Secondary | ICD-10-CM | POA: Diagnosis not present

## 2012-08-11 DIAGNOSIS — I359 Nonrheumatic aortic valve disorder, unspecified: Secondary | ICD-10-CM | POA: Diagnosis not present

## 2012-08-11 DIAGNOSIS — Z954 Presence of other heart-valve replacement: Secondary | ICD-10-CM | POA: Diagnosis not present

## 2012-08-11 DIAGNOSIS — N2581 Secondary hyperparathyroidism of renal origin: Secondary | ICD-10-CM | POA: Diagnosis not present

## 2012-08-11 DIAGNOSIS — Z992 Dependence on renal dialysis: Secondary | ICD-10-CM | POA: Diagnosis not present

## 2012-08-11 DIAGNOSIS — D631 Anemia in chronic kidney disease: Secondary | ICD-10-CM | POA: Diagnosis not present

## 2012-08-11 DIAGNOSIS — N186 End stage renal disease: Secondary | ICD-10-CM | POA: Diagnosis not present

## 2012-08-11 DIAGNOSIS — I4510 Unspecified right bundle-branch block: Secondary | ICD-10-CM | POA: Diagnosis not present

## 2012-08-11 DIAGNOSIS — I2510 Atherosclerotic heart disease of native coronary artery without angina pectoris: Secondary | ICD-10-CM | POA: Diagnosis not present

## 2012-08-13 DIAGNOSIS — Z992 Dependence on renal dialysis: Secondary | ICD-10-CM | POA: Diagnosis not present

## 2012-08-13 DIAGNOSIS — N039 Chronic nephritic syndrome with unspecified morphologic changes: Secondary | ICD-10-CM | POA: Diagnosis not present

## 2012-08-13 DIAGNOSIS — D509 Iron deficiency anemia, unspecified: Secondary | ICD-10-CM | POA: Diagnosis not present

## 2012-08-13 DIAGNOSIS — N186 End stage renal disease: Secondary | ICD-10-CM | POA: Diagnosis not present

## 2012-08-13 DIAGNOSIS — N2581 Secondary hyperparathyroidism of renal origin: Secondary | ICD-10-CM | POA: Diagnosis not present

## 2012-08-16 DIAGNOSIS — Z992 Dependence on renal dialysis: Secondary | ICD-10-CM | POA: Diagnosis not present

## 2012-08-16 DIAGNOSIS — N039 Chronic nephritic syndrome with unspecified morphologic changes: Secondary | ICD-10-CM | POA: Diagnosis not present

## 2012-08-16 DIAGNOSIS — N2581 Secondary hyperparathyroidism of renal origin: Secondary | ICD-10-CM | POA: Diagnosis not present

## 2012-08-16 DIAGNOSIS — D509 Iron deficiency anemia, unspecified: Secondary | ICD-10-CM | POA: Diagnosis not present

## 2012-08-16 DIAGNOSIS — N186 End stage renal disease: Secondary | ICD-10-CM | POA: Diagnosis not present

## 2012-08-17 ENCOUNTER — Ambulatory Visit (INDEPENDENT_AMBULATORY_CARE_PROVIDER_SITE_OTHER): Payer: Medicare Other | Admitting: Pharmacist Clinician (PhC)/ Clinical Pharmacy Specialist

## 2012-08-17 ENCOUNTER — Encounter: Payer: Medicare Other | Admitting: Pharmacist Clinician (PhC)/ Clinical Pharmacy Specialist

## 2012-08-17 DIAGNOSIS — Z5181 Encounter for therapeutic drug level monitoring: Secondary | ICD-10-CM | POA: Diagnosis not present

## 2012-08-17 DIAGNOSIS — Z952 Presence of prosthetic heart valve: Secondary | ICD-10-CM

## 2012-08-17 DIAGNOSIS — Z7901 Long term (current) use of anticoagulants: Secondary | ICD-10-CM | POA: Diagnosis not present

## 2012-08-17 DIAGNOSIS — Z954 Presence of other heart-valve replacement: Secondary | ICD-10-CM

## 2012-08-17 LAB — POCT INR: INR: 2.6

## 2012-08-18 DIAGNOSIS — Z992 Dependence on renal dialysis: Secondary | ICD-10-CM | POA: Diagnosis not present

## 2012-08-18 DIAGNOSIS — H2510 Age-related nuclear cataract, unspecified eye: Secondary | ICD-10-CM | POA: Diagnosis not present

## 2012-08-18 DIAGNOSIS — N2581 Secondary hyperparathyroidism of renal origin: Secondary | ICD-10-CM | POA: Diagnosis not present

## 2012-08-18 DIAGNOSIS — D509 Iron deficiency anemia, unspecified: Secondary | ICD-10-CM | POA: Diagnosis not present

## 2012-08-18 DIAGNOSIS — H25049 Posterior subcapsular polar age-related cataract, unspecified eye: Secondary | ICD-10-CM | POA: Diagnosis not present

## 2012-08-18 DIAGNOSIS — N186 End stage renal disease: Secondary | ICD-10-CM | POA: Diagnosis not present

## 2012-08-18 DIAGNOSIS — D631 Anemia in chronic kidney disease: Secondary | ICD-10-CM | POA: Diagnosis not present

## 2012-08-18 DIAGNOSIS — H25019 Cortical age-related cataract, unspecified eye: Secondary | ICD-10-CM | POA: Diagnosis not present

## 2012-08-20 DIAGNOSIS — N039 Chronic nephritic syndrome with unspecified morphologic changes: Secondary | ICD-10-CM | POA: Diagnosis not present

## 2012-08-20 DIAGNOSIS — N2581 Secondary hyperparathyroidism of renal origin: Secondary | ICD-10-CM | POA: Diagnosis not present

## 2012-08-20 DIAGNOSIS — D509 Iron deficiency anemia, unspecified: Secondary | ICD-10-CM | POA: Diagnosis not present

## 2012-08-20 DIAGNOSIS — N186 End stage renal disease: Secondary | ICD-10-CM | POA: Diagnosis not present

## 2012-08-20 DIAGNOSIS — Z992 Dependence on renal dialysis: Secondary | ICD-10-CM | POA: Diagnosis not present

## 2012-08-23 DIAGNOSIS — Z992 Dependence on renal dialysis: Secondary | ICD-10-CM | POA: Diagnosis not present

## 2012-08-23 DIAGNOSIS — N186 End stage renal disease: Secondary | ICD-10-CM | POA: Diagnosis not present

## 2012-08-23 DIAGNOSIS — D509 Iron deficiency anemia, unspecified: Secondary | ICD-10-CM | POA: Diagnosis not present

## 2012-08-23 DIAGNOSIS — D631 Anemia in chronic kidney disease: Secondary | ICD-10-CM | POA: Diagnosis not present

## 2012-08-23 DIAGNOSIS — N2581 Secondary hyperparathyroidism of renal origin: Secondary | ICD-10-CM | POA: Diagnosis not present

## 2012-08-25 DIAGNOSIS — Z992 Dependence on renal dialysis: Secondary | ICD-10-CM | POA: Diagnosis not present

## 2012-08-25 DIAGNOSIS — D509 Iron deficiency anemia, unspecified: Secondary | ICD-10-CM | POA: Diagnosis not present

## 2012-08-25 DIAGNOSIS — N039 Chronic nephritic syndrome with unspecified morphologic changes: Secondary | ICD-10-CM | POA: Diagnosis not present

## 2012-08-25 DIAGNOSIS — N186 End stage renal disease: Secondary | ICD-10-CM | POA: Diagnosis not present

## 2012-08-25 DIAGNOSIS — N2581 Secondary hyperparathyroidism of renal origin: Secondary | ICD-10-CM | POA: Diagnosis not present

## 2012-08-27 DIAGNOSIS — D509 Iron deficiency anemia, unspecified: Secondary | ICD-10-CM | POA: Diagnosis not present

## 2012-08-27 DIAGNOSIS — N2581 Secondary hyperparathyroidism of renal origin: Secondary | ICD-10-CM | POA: Diagnosis not present

## 2012-08-27 DIAGNOSIS — N186 End stage renal disease: Secondary | ICD-10-CM | POA: Diagnosis not present

## 2012-08-27 DIAGNOSIS — D631 Anemia in chronic kidney disease: Secondary | ICD-10-CM | POA: Diagnosis not present

## 2012-08-27 DIAGNOSIS — Z992 Dependence on renal dialysis: Secondary | ICD-10-CM | POA: Diagnosis not present

## 2012-08-28 DIAGNOSIS — H2510 Age-related nuclear cataract, unspecified eye: Secondary | ICD-10-CM | POA: Diagnosis not present

## 2012-08-30 DIAGNOSIS — D509 Iron deficiency anemia, unspecified: Secondary | ICD-10-CM | POA: Diagnosis not present

## 2012-08-30 DIAGNOSIS — Z992 Dependence on renal dialysis: Secondary | ICD-10-CM | POA: Diagnosis not present

## 2012-08-30 DIAGNOSIS — N186 End stage renal disease: Secondary | ICD-10-CM | POA: Diagnosis not present

## 2012-08-30 DIAGNOSIS — N039 Chronic nephritic syndrome with unspecified morphologic changes: Secondary | ICD-10-CM | POA: Diagnosis not present

## 2012-08-30 DIAGNOSIS — N2581 Secondary hyperparathyroidism of renal origin: Secondary | ICD-10-CM | POA: Diagnosis not present

## 2012-09-01 ENCOUNTER — Ambulatory Visit (INDEPENDENT_AMBULATORY_CARE_PROVIDER_SITE_OTHER): Payer: Medicare Other | Admitting: Pharmacist

## 2012-09-01 VITALS — BP 164/68 | HR 74

## 2012-09-01 DIAGNOSIS — Z952 Presence of prosthetic heart valve: Secondary | ICD-10-CM

## 2012-09-01 DIAGNOSIS — Z5181 Encounter for therapeutic drug level monitoring: Secondary | ICD-10-CM

## 2012-09-01 DIAGNOSIS — N2581 Secondary hyperparathyroidism of renal origin: Secondary | ICD-10-CM | POA: Diagnosis not present

## 2012-09-01 DIAGNOSIS — I10 Essential (primary) hypertension: Secondary | ICD-10-CM | POA: Diagnosis not present

## 2012-09-01 DIAGNOSIS — Z954 Presence of other heart-valve replacement: Secondary | ICD-10-CM | POA: Diagnosis not present

## 2012-09-01 DIAGNOSIS — Z7901 Long term (current) use of anticoagulants: Secondary | ICD-10-CM | POA: Diagnosis not present

## 2012-09-01 DIAGNOSIS — N186 End stage renal disease: Secondary | ICD-10-CM | POA: Diagnosis not present

## 2012-09-01 DIAGNOSIS — Z992 Dependence on renal dialysis: Secondary | ICD-10-CM | POA: Diagnosis not present

## 2012-09-01 DIAGNOSIS — D631 Anemia in chronic kidney disease: Secondary | ICD-10-CM | POA: Diagnosis not present

## 2012-09-01 DIAGNOSIS — D509 Iron deficiency anemia, unspecified: Secondary | ICD-10-CM | POA: Diagnosis not present

## 2012-09-01 LAB — POCT INR: INR: 4.4

## 2012-09-01 MED ORDER — CLONIDINE HCL 0.2 MG/24HR TD PTWK: 1.0000 | MEDICATED_PATCH | TRANSDERMAL | Status: DC

## 2012-09-01 NOTE — Progress Notes (Signed)
Patient c/o feeling sleepy after taking clonidine 0.1mg  bid for BP.  Also BP not well controlled today. Will change oral clonidine to clonidine 0.2mg  patches - apply 1 patch for 7 days, then remove and replace.  Pt instructed to take clonidine 0.1mg  tablet 1/2 tablet bid for first 2 days of patch and then discontinue.

## 2012-09-01 NOTE — Patient Instructions (Signed)
Will change oral clonidine to clonidine 0.2mg  patches - apply 1 patch for 7 days, then remove and replace.   Take clonidine 0.1mg  tablet 1/2 tablet bid for first 2 days of patch and then discontinue.

## 2012-09-03 ENCOUNTER — Encounter: Payer: Self-pay | Admitting: Physician Assistant

## 2012-09-03 ENCOUNTER — Ambulatory Visit (INDEPENDENT_AMBULATORY_CARE_PROVIDER_SITE_OTHER): Payer: Medicare Other | Admitting: Physician Assistant

## 2012-09-03 ENCOUNTER — Ambulatory Visit: Payer: Self-pay | Admitting: Family Medicine

## 2012-09-03 VITALS — BP 181/88 | HR 62 | Ht 70.0 in | Wt 187.0 lb

## 2012-09-03 DIAGNOSIS — I499 Cardiac arrhythmia, unspecified: Secondary | ICD-10-CM

## 2012-09-03 DIAGNOSIS — N039 Chronic nephritic syndrome with unspecified morphologic changes: Secondary | ICD-10-CM | POA: Diagnosis not present

## 2012-09-03 DIAGNOSIS — Z79899 Other long term (current) drug therapy: Secondary | ICD-10-CM | POA: Diagnosis not present

## 2012-09-03 DIAGNOSIS — Z992 Dependence on renal dialysis: Secondary | ICD-10-CM | POA: Diagnosis not present

## 2012-09-03 DIAGNOSIS — D509 Iron deficiency anemia, unspecified: Secondary | ICD-10-CM | POA: Diagnosis not present

## 2012-09-03 DIAGNOSIS — N186 End stage renal disease: Secondary | ICD-10-CM | POA: Diagnosis not present

## 2012-09-03 DIAGNOSIS — N2581 Secondary hyperparathyroidism of renal origin: Secondary | ICD-10-CM | POA: Diagnosis not present

## 2012-09-03 DIAGNOSIS — T148XXA Other injury of unspecified body region, initial encounter: Secondary | ICD-10-CM

## 2012-09-03 LAB — POCT CBC
Granulocyte percent: 71.3 % (ref 37–80)
HCT, POC: 38.8 % — AB (ref 43.5–53.7)
Hemoglobin: 12.8 g/dL — AB (ref 14.1–18.1)
Lymph, poc: 1.3 (ref 0.6–3.4)
MCH, POC: 30.3 pg (ref 27–31.2)
MCHC: 33 g/dL (ref 31.8–35.4)
MCV: 91.9 fL (ref 80–97)
MPV: 7.6 fL (ref 0–99.8)
POC Granulocyte: 4.3 (ref 2–6.9)
POC LYMPH PERCENT: 22.3 % (ref 10–50)
Platelet Count, POC: 157 K/uL (ref 142–424)
RBC: 4.2 M/uL — AB (ref 4.69–6.13)
RDW, POC: 18.2 %
WBC: 6 K/uL (ref 4.6–10.2)

## 2012-09-03 NOTE — Patient Instructions (Signed)
Cardiac Arrhythmia  Your heart is a muscle that works to pump blood through your body by regular contractions. The beating of your heart is controlled by a system of special pacemaker cells. These cells control the electrical activity of the heart. When the system controlling this regular beating is disturbed, a heart rhythm abnormality (arrhythmia) results.  WHEN YOUR HEART SKIPS A BEAT  One of the most common and least serious heart arrhythmias is called an ectopic or premature atrial heartbeat (PAC). This may be noticed as a small change in your regular pulse. A PAC originates from the top part (atrium) of the heart. Within the right atrium, the SA node is the area that normally controls the regularity of the heart. PACs occur in heart tissue outside of the SA node region. You may feel this as a skipped beat or heart flutter, especially if several occur in succession or occur frequently.   Another arrhythmia is ventricular premature complex (VCP or PVC). These extra beats start out in the bottom, more muscular chambers of the heart. In most cases a PVC is harmless. If there are underlying causes that are making the heart irritable such as an overactive thyroid or a prior heart attack PVCs may be of more concern. In a few cases, medications to control the heart rhythm may be prescribed.  Things to try at home:   Cut down or avoid alcohol, tobacco and caffeine.   Get enough sleep.   Reduce stress.   Exercise more.  WHEN THE HEART BEATS TOO FAST  Atrial tachycardia is a fast heart rate, which starts out in the atrium. It may last from minutes to much longer. Your heart may beat 140 to 240 times per minute instead of the normal 60 to 100.   Symptoms include a worried feeling (anxiety) and a sense that your heart is beating fast and hard.   You may be able to stop the fast rate by holding your breath or bearing down as if you were going to have a bowel movement.   This type of fast rate is usually not  dangerous.  Atrial fibrillation and atrial flutter are other fast rhythms that start in the atria. Both conditions keep the atria from filling with enough blood so the heart does not work well.   Symptoms include feeling light-headed or faint.   These fast rates may be the result of heart damage or disease. Too much thyroid hormone may play a role.   There may be no clear cause or it may be from heart disease or damage.   Medication or a special electrical treatment (cardioversion) may be needed to get the heart beating normally.  Ventricular tachycardia is a fast heart rate that starts in the lower muscular chambers (ventricles) This is a serious disorder that requires treatment as soon as possible. You need someone else to get and use a small defibrillator.   Symptoms include collapse, chest pain, or being short of breath.   Treatment may include medication, procedures to improve blood flow to the heart, or an implantable cardiac defibrillator (ICD).  DIAGNOSIS    A cardiogram (EKG or ECG) will be done to see the arrhythmia, as well as lab tests to check the underlying cause.   If the extra beats or fast rate come and go, you may wear a Holter monitor that records your heart rate for a longer period of time.  SEEK MEDICAL CARE IF:   You have irregular or fast heartbeats (palpitations).     You experience skipped beats.   You develop lightheadedness.   You have chest discomfort.   You have shortness of breath.   You have more frequent episodes, if you are already being treated.  SEEK IMMEDIATE MEDICAL CARE IF:    You have severe chest pain, especially if the pain is crushing or pressure-like and spreads to the arms, back, neck, or jaw, or if you have sweating, feeling sick to your stomach (nausea), or shortness of breath. THIS IS AN EMERGENCY. Do not wait to see if the pain will go away. Get medical help at once. Call 911 or 0 (operator). DO NOT drive yourself to the hospital.   You feel dizzy or  faint.   You have episodes of previously documented atrial tachycardia that do not resolve with the techniques your caregiver has taught you.   Irregular or rapid heartbeats begin to occur more often than in the past, especially if they are associated with more pronounced symptoms or of longer duration.  Document Released: 03/24/2005 Document Revised: 06/16/2011 Document Reviewed: 11/10/2007  [employer] Patient Information 2014 [employer].

## 2012-09-03 NOTE — Progress Notes (Signed)
Subjective:     Patient ID: Jeremy [name], male   DOB: 1955/12/17, 57 y.o.   MRN: [id number]  HPI Pt noted a irreg heartbeat at his dialysis appt He was informed to go to the ER but the pt contacted his Card who recommended come here and have EKG done and fax results to them He denies any CP, SOB, or near syncopal spells He has hx of valve replacement in Mar and is on Coumadin and ASA Pt has also has some lower ext bruising so it was recommended CBC  Review of Systems  All other systems reviewed and are negative.       Objective:   Physical Exam  Nursing note and vitals reviewed. Neck: No JVD present.  No bruits  Heart- Irreg irreg, no murmur,  Pulses equal in upper ext No lower ext edema EKG- show PAC with rate of 70, Changes from prev but do not have and EKG to compare since valve replacement     Assessment:     1. Bruising   2. Irregular heart beat        Plan:     EKG faxed to his Card today Pt aware if he notes CP,SOB, syncope to call EMS Pt to f/u with Card

## 2012-09-04 DIAGNOSIS — N186 End stage renal disease: Secondary | ICD-10-CM | POA: Diagnosis not present

## 2012-09-06 DIAGNOSIS — N186 End stage renal disease: Secondary | ICD-10-CM | POA: Diagnosis not present

## 2012-09-06 DIAGNOSIS — D631 Anemia in chronic kidney disease: Secondary | ICD-10-CM | POA: Diagnosis not present

## 2012-09-06 DIAGNOSIS — M545 Low back pain: Secondary | ICD-10-CM | POA: Insufficient documentation

## 2012-09-06 DIAGNOSIS — Z992 Dependence on renal dialysis: Secondary | ICD-10-CM | POA: Diagnosis not present

## 2012-09-06 DIAGNOSIS — D509 Iron deficiency anemia, unspecified: Secondary | ICD-10-CM | POA: Diagnosis not present

## 2012-09-06 DIAGNOSIS — M461 Sacroiliitis, not elsewhere classified: Secondary | ICD-10-CM | POA: Diagnosis not present

## 2012-09-06 DIAGNOSIS — IMO0002 Reserved for concepts with insufficient information to code with codable children: Secondary | ICD-10-CM | POA: Diagnosis not present

## 2012-09-06 DIAGNOSIS — M5416 Radiculopathy, lumbar region: Secondary | ICD-10-CM | POA: Insufficient documentation

## 2012-09-06 DIAGNOSIS — N2581 Secondary hyperparathyroidism of renal origin: Secondary | ICD-10-CM | POA: Diagnosis not present

## 2012-09-07 DIAGNOSIS — Z952 Presence of prosthetic heart valve: Secondary | ICD-10-CM | POA: Insufficient documentation

## 2012-09-07 DIAGNOSIS — Z95820 Peripheral vascular angioplasty status with implants and grafts: Secondary | ICD-10-CM | POA: Insufficient documentation

## 2012-09-07 DIAGNOSIS — Z9861 Coronary angioplasty status: Secondary | ICD-10-CM | POA: Insufficient documentation

## 2012-09-13 ENCOUNTER — Encounter: Payer: Medicare Other | Admitting: Pharmacist

## 2012-09-13 DIAGNOSIS — M47817 Spondylosis without myelopathy or radiculopathy, lumbosacral region: Secondary | ICD-10-CM | POA: Diagnosis not present

## 2012-09-13 DIAGNOSIS — M545 Low back pain, unspecified: Secondary | ICD-10-CM | POA: Diagnosis not present

## 2012-09-13 DIAGNOSIS — IMO0002 Reserved for concepts with insufficient information to code with codable children: Secondary | ICD-10-CM | POA: Diagnosis not present

## 2012-09-14 ENCOUNTER — Ambulatory Visit (INDEPENDENT_AMBULATORY_CARE_PROVIDER_SITE_OTHER): Payer: Medicare Other | Admitting: Pharmacist Clinician (PhC)/ Clinical Pharmacy Specialist

## 2012-09-14 DIAGNOSIS — Z954 Presence of other heart-valve replacement: Secondary | ICD-10-CM | POA: Diagnosis not present

## 2012-09-14 DIAGNOSIS — Z952 Presence of prosthetic heart valve: Secondary | ICD-10-CM

## 2012-09-14 DIAGNOSIS — Z7901 Long term (current) use of anticoagulants: Secondary | ICD-10-CM

## 2012-09-14 DIAGNOSIS — Z5181 Encounter for therapeutic drug level monitoring: Secondary | ICD-10-CM | POA: Diagnosis not present

## 2012-09-14 LAB — POCT INR: INR: 5

## 2012-09-20 ENCOUNTER — Ambulatory Visit (INDEPENDENT_AMBULATORY_CARE_PROVIDER_SITE_OTHER): Payer: Medicare Other | Admitting: Family Medicine

## 2012-09-20 ENCOUNTER — Encounter: Payer: Self-pay | Admitting: Family Medicine

## 2012-09-20 ENCOUNTER — Other Ambulatory Visit: Payer: Medicare Other

## 2012-09-20 VITALS — BP 142/57 | HR 70 | Temp 98.7°F | Wt 186.0 lb

## 2012-09-20 DIAGNOSIS — Z5181 Encounter for therapeutic drug level monitoring: Secondary | ICD-10-CM

## 2012-09-20 DIAGNOSIS — Z952 Presence of prosthetic heart valve: Secondary | ICD-10-CM

## 2012-09-20 DIAGNOSIS — Z954 Presence of other heart-valve replacement: Secondary | ICD-10-CM | POA: Diagnosis not present

## 2012-09-20 LAB — POCT INR: INR: 1.9

## 2012-09-20 NOTE — Progress Notes (Signed)
Patient ID: Jeremy [name], male   DOB: 12-Apr-1955, 57 y.o.   MRN: [id number] Patient here for protime / anticoag management, INR is 1.9.  Goal is 2.5-3.5 present coumadin dose: 2 mg Mondays and Fridays. 1 mg all other days. Doing ok . No blkeeding no problems  Plan  incraese to 2 mg Mondays , wednesdays and Fridays. 1 mg all the other days. RTc 1 week to recheck Protime.

## 2012-09-21 DIAGNOSIS — D485 Neoplasm of uncertain behavior of skin: Secondary | ICD-10-CM | POA: Diagnosis not present

## 2012-09-21 DIAGNOSIS — L282 Other prurigo: Secondary | ICD-10-CM | POA: Diagnosis not present

## 2012-09-21 DIAGNOSIS — L570 Actinic keratosis: Secondary | ICD-10-CM | POA: Diagnosis not present

## 2012-09-28 ENCOUNTER — Ambulatory Visit (INDEPENDENT_AMBULATORY_CARE_PROVIDER_SITE_OTHER): Payer: Medicare Other | Admitting: Pharmacist Clinician (PhC)/ Clinical Pharmacy Specialist

## 2012-09-28 DIAGNOSIS — Z954 Presence of other heart-valve replacement: Secondary | ICD-10-CM | POA: Diagnosis not present

## 2012-09-28 DIAGNOSIS — Z5181 Encounter for therapeutic drug level monitoring: Secondary | ICD-10-CM

## 2012-09-28 DIAGNOSIS — Z952 Presence of prosthetic heart valve: Secondary | ICD-10-CM

## 2012-09-28 DIAGNOSIS — Z7901 Long term (current) use of anticoagulants: Secondary | ICD-10-CM

## 2012-09-28 LAB — POCT INR: INR: 2.3

## 2012-10-04 DIAGNOSIS — N186 End stage renal disease: Secondary | ICD-10-CM | POA: Diagnosis not present

## 2012-10-06 DIAGNOSIS — N186 End stage renal disease: Secondary | ICD-10-CM | POA: Diagnosis not present

## 2012-10-06 DIAGNOSIS — N2581 Secondary hyperparathyroidism of renal origin: Secondary | ICD-10-CM | POA: Diagnosis not present

## 2012-10-06 DIAGNOSIS — D631 Anemia in chronic kidney disease: Secondary | ICD-10-CM | POA: Diagnosis not present

## 2012-10-06 DIAGNOSIS — D509 Iron deficiency anemia, unspecified: Secondary | ICD-10-CM | POA: Diagnosis not present

## 2012-10-11 ENCOUNTER — Telehealth: Payer: Self-pay | Admitting: Pharmacist

## 2012-10-11 NOTE — Telephone Encounter (Signed)
Patient thinks he can be here by 1:30pm tomorrow.  I let him know that Sharyn Lull will be here until 2pm.  If cannot make it by that time, then come in Wednesday and I will work him in for Protime

## 2012-10-12 DIAGNOSIS — Z483 Aftercare following surgery for neoplasm: Secondary | ICD-10-CM | POA: Diagnosis not present

## 2012-10-12 DIAGNOSIS — C649 Malignant neoplasm of unspecified kidney, except renal pelvis: Secondary | ICD-10-CM | POA: Diagnosis not present

## 2012-10-12 DIAGNOSIS — Z0389 Encounter for observation for other suspected diseases and conditions ruled out: Secondary | ICD-10-CM | POA: Diagnosis not present

## 2012-10-12 DIAGNOSIS — Z1289 Encounter for screening for malignant neoplasm of other sites: Secondary | ICD-10-CM | POA: Diagnosis not present

## 2012-10-12 DIAGNOSIS — E041 Nontoxic single thyroid nodule: Secondary | ICD-10-CM | POA: Diagnosis not present

## 2012-10-12 DIAGNOSIS — Z85528 Personal history of other malignant neoplasm of kidney: Secondary | ICD-10-CM | POA: Diagnosis not present

## 2012-10-13 DIAGNOSIS — Z992 Dependence on renal dialysis: Secondary | ICD-10-CM | POA: Diagnosis not present

## 2012-10-13 DIAGNOSIS — N186 End stage renal disease: Secondary | ICD-10-CM | POA: Diagnosis not present

## 2012-10-14 ENCOUNTER — Ambulatory Visit: Payer: Self-pay | Admitting: Pharmacist

## 2012-10-14 ENCOUNTER — Other Ambulatory Visit: Payer: Medicare Other

## 2012-10-14 ENCOUNTER — Ambulatory Visit (INDEPENDENT_AMBULATORY_CARE_PROVIDER_SITE_OTHER): Payer: Medicare Other | Admitting: Pharmacist

## 2012-10-14 ENCOUNTER — Other Ambulatory Visit: Payer: Self-pay | Admitting: Pharmacist

## 2012-10-14 DIAGNOSIS — Z954 Presence of other heart-valve replacement: Secondary | ICD-10-CM | POA: Diagnosis not present

## 2012-10-14 DIAGNOSIS — Z5181 Encounter for therapeutic drug level monitoring: Secondary | ICD-10-CM

## 2012-10-14 DIAGNOSIS — Z952 Presence of prosthetic heart valve: Secondary | ICD-10-CM

## 2012-10-14 DIAGNOSIS — Z7901 Long term (current) use of anticoagulants: Secondary | ICD-10-CM | POA: Diagnosis not present

## 2012-10-14 LAB — POCT INR: INR: 2.4

## 2012-10-14 NOTE — Progress Notes (Signed)
Patient came in for labs only.

## 2012-10-14 NOTE — Progress Notes (Signed)
Patient requested referral for home INR through [facility]. Paperwork completed - Dr Jacelyn Grip sign and faxed to [facility].

## 2012-10-14 NOTE — Patient Instructions (Signed)
Anticoagulation Dose Instructions as of 10/14/2012     Jeremy [name] Tue Wed Thu Fri Sat   New Dose 1 mg 2 mg 1 mg 2 mg 1 mg 2 mg 2 mg    Description       Continue 2mg  Mondays, Wednesdays, Fridays and Saturdays and  1mg  all other days.       INR was 2.4 today

## 2012-10-21 DIAGNOSIS — H25019 Cortical age-related cataract, unspecified eye: Secondary | ICD-10-CM | POA: Diagnosis not present

## 2012-10-21 DIAGNOSIS — H2510 Age-related nuclear cataract, unspecified eye: Secondary | ICD-10-CM | POA: Diagnosis not present

## 2012-10-21 DIAGNOSIS — H25049 Posterior subcapsular polar age-related cataract, unspecified eye: Secondary | ICD-10-CM | POA: Diagnosis not present

## 2012-11-01 ENCOUNTER — Ambulatory Visit (INDEPENDENT_AMBULATORY_CARE_PROVIDER_SITE_OTHER): Payer: Self-pay | Admitting: Pharmacist

## 2012-11-01 DIAGNOSIS — Z5181 Encounter for therapeutic drug level monitoring: Secondary | ICD-10-CM

## 2012-11-01 DIAGNOSIS — Z954 Presence of other heart-valve replacement: Secondary | ICD-10-CM | POA: Diagnosis not present

## 2012-11-01 DIAGNOSIS — Z952 Presence of prosthetic heart valve: Secondary | ICD-10-CM

## 2012-11-01 DIAGNOSIS — Z7901 Long term (current) use of anticoagulants: Secondary | ICD-10-CM

## 2012-11-01 LAB — POCT INR: INR: 3.4

## 2012-11-04 DIAGNOSIS — N186 End stage renal disease: Secondary | ICD-10-CM | POA: Diagnosis not present

## 2012-11-05 DIAGNOSIS — N2581 Secondary hyperparathyroidism of renal origin: Secondary | ICD-10-CM | POA: Diagnosis not present

## 2012-11-05 DIAGNOSIS — Z992 Dependence on renal dialysis: Secondary | ICD-10-CM | POA: Diagnosis not present

## 2012-11-05 DIAGNOSIS — N186 End stage renal disease: Secondary | ICD-10-CM | POA: Diagnosis not present

## 2012-11-05 DIAGNOSIS — D631 Anemia in chronic kidney disease: Secondary | ICD-10-CM | POA: Diagnosis not present

## 2012-11-05 DIAGNOSIS — D509 Iron deficiency anemia, unspecified: Secondary | ICD-10-CM | POA: Diagnosis not present

## 2012-11-05 DIAGNOSIS — N039 Chronic nephritic syndrome with unspecified morphologic changes: Secondary | ICD-10-CM | POA: Diagnosis not present

## 2012-11-08 ENCOUNTER — Ambulatory Visit (INDEPENDENT_AMBULATORY_CARE_PROVIDER_SITE_OTHER): Payer: Self-pay | Admitting: Pharmacist

## 2012-11-08 DIAGNOSIS — N186 End stage renal disease: Secondary | ICD-10-CM | POA: Diagnosis not present

## 2012-11-08 DIAGNOSIS — Z5181 Encounter for therapeutic drug level monitoring: Secondary | ICD-10-CM

## 2012-11-08 DIAGNOSIS — D631 Anemia in chronic kidney disease: Secondary | ICD-10-CM | POA: Diagnosis not present

## 2012-11-08 DIAGNOSIS — Z7901 Long term (current) use of anticoagulants: Secondary | ICD-10-CM

## 2012-11-08 DIAGNOSIS — D509 Iron deficiency anemia, unspecified: Secondary | ICD-10-CM | POA: Diagnosis not present

## 2012-11-08 DIAGNOSIS — Z952 Presence of prosthetic heart valve: Secondary | ICD-10-CM

## 2012-11-08 DIAGNOSIS — N2581 Secondary hyperparathyroidism of renal origin: Secondary | ICD-10-CM | POA: Diagnosis not present

## 2012-11-08 DIAGNOSIS — Z992 Dependence on renal dialysis: Secondary | ICD-10-CM | POA: Diagnosis not present

## 2012-11-08 DIAGNOSIS — Z954 Presence of other heart-valve replacement: Secondary | ICD-10-CM

## 2012-11-10 DIAGNOSIS — N039 Chronic nephritic syndrome with unspecified morphologic changes: Secondary | ICD-10-CM | POA: Diagnosis not present

## 2012-11-10 DIAGNOSIS — N186 End stage renal disease: Secondary | ICD-10-CM | POA: Diagnosis not present

## 2012-11-10 DIAGNOSIS — Z992 Dependence on renal dialysis: Secondary | ICD-10-CM | POA: Diagnosis not present

## 2012-11-10 DIAGNOSIS — D509 Iron deficiency anemia, unspecified: Secondary | ICD-10-CM | POA: Diagnosis not present

## 2012-11-10 DIAGNOSIS — N2581 Secondary hyperparathyroidism of renal origin: Secondary | ICD-10-CM | POA: Diagnosis not present

## 2012-11-11 DIAGNOSIS — H25049 Posterior subcapsular polar age-related cataract, unspecified eye: Secondary | ICD-10-CM | POA: Diagnosis not present

## 2012-11-11 DIAGNOSIS — H2510 Age-related nuclear cataract, unspecified eye: Secondary | ICD-10-CM | POA: Diagnosis not present

## 2012-11-12 DIAGNOSIS — D509 Iron deficiency anemia, unspecified: Secondary | ICD-10-CM | POA: Diagnosis not present

## 2012-11-12 DIAGNOSIS — N039 Chronic nephritic syndrome with unspecified morphologic changes: Secondary | ICD-10-CM | POA: Diagnosis not present

## 2012-11-12 DIAGNOSIS — N186 End stage renal disease: Secondary | ICD-10-CM | POA: Diagnosis not present

## 2012-11-12 DIAGNOSIS — N2581 Secondary hyperparathyroidism of renal origin: Secondary | ICD-10-CM | POA: Diagnosis not present

## 2012-11-12 DIAGNOSIS — Z992 Dependence on renal dialysis: Secondary | ICD-10-CM | POA: Diagnosis not present

## 2012-11-15 DIAGNOSIS — N2581 Secondary hyperparathyroidism of renal origin: Secondary | ICD-10-CM | POA: Diagnosis not present

## 2012-11-15 DIAGNOSIS — D631 Anemia in chronic kidney disease: Secondary | ICD-10-CM | POA: Diagnosis not present

## 2012-11-15 DIAGNOSIS — N186 End stage renal disease: Secondary | ICD-10-CM | POA: Diagnosis not present

## 2012-11-15 DIAGNOSIS — Z992 Dependence on renal dialysis: Secondary | ICD-10-CM | POA: Diagnosis not present

## 2012-11-15 DIAGNOSIS — D509 Iron deficiency anemia, unspecified: Secondary | ICD-10-CM | POA: Diagnosis not present

## 2012-11-16 LAB — POCT INR: INR: 1.7

## 2012-11-17 DIAGNOSIS — Z992 Dependence on renal dialysis: Secondary | ICD-10-CM | POA: Diagnosis not present

## 2012-11-17 DIAGNOSIS — N186 End stage renal disease: Secondary | ICD-10-CM | POA: Diagnosis not present

## 2012-11-17 DIAGNOSIS — D509 Iron deficiency anemia, unspecified: Secondary | ICD-10-CM | POA: Diagnosis not present

## 2012-11-17 DIAGNOSIS — N039 Chronic nephritic syndrome with unspecified morphologic changes: Secondary | ICD-10-CM | POA: Diagnosis not present

## 2012-11-17 DIAGNOSIS — N2581 Secondary hyperparathyroidism of renal origin: Secondary | ICD-10-CM | POA: Diagnosis not present

## 2012-11-18 ENCOUNTER — Ambulatory Visit (INDEPENDENT_AMBULATORY_CARE_PROVIDER_SITE_OTHER): Payer: Medicare Other | Admitting: Pharmacist

## 2012-11-18 DIAGNOSIS — Z952 Presence of prosthetic heart valve: Secondary | ICD-10-CM

## 2012-11-18 DIAGNOSIS — Z954 Presence of other heart-valve replacement: Secondary | ICD-10-CM

## 2012-11-18 DIAGNOSIS — Z7901 Long term (current) use of anticoagulants: Secondary | ICD-10-CM

## 2012-11-18 DIAGNOSIS — Z5181 Encounter for therapeutic drug level monitoring: Secondary | ICD-10-CM

## 2012-11-19 DIAGNOSIS — N186 End stage renal disease: Secondary | ICD-10-CM | POA: Diagnosis not present

## 2012-11-19 DIAGNOSIS — N2581 Secondary hyperparathyroidism of renal origin: Secondary | ICD-10-CM | POA: Diagnosis not present

## 2012-11-19 DIAGNOSIS — Z992 Dependence on renal dialysis: Secondary | ICD-10-CM | POA: Diagnosis not present

## 2012-11-19 DIAGNOSIS — D509 Iron deficiency anemia, unspecified: Secondary | ICD-10-CM | POA: Diagnosis not present

## 2012-11-19 DIAGNOSIS — N039 Chronic nephritic syndrome with unspecified morphologic changes: Secondary | ICD-10-CM | POA: Diagnosis not present

## 2012-11-22 DIAGNOSIS — Z992 Dependence on renal dialysis: Secondary | ICD-10-CM | POA: Diagnosis not present

## 2012-11-22 DIAGNOSIS — N2581 Secondary hyperparathyroidism of renal origin: Secondary | ICD-10-CM | POA: Diagnosis not present

## 2012-11-22 DIAGNOSIS — N186 End stage renal disease: Secondary | ICD-10-CM | POA: Diagnosis not present

## 2012-11-22 DIAGNOSIS — D509 Iron deficiency anemia, unspecified: Secondary | ICD-10-CM | POA: Diagnosis not present

## 2012-11-22 DIAGNOSIS — D631 Anemia in chronic kidney disease: Secondary | ICD-10-CM | POA: Diagnosis not present

## 2012-11-23 ENCOUNTER — Ambulatory Visit (INDEPENDENT_AMBULATORY_CARE_PROVIDER_SITE_OTHER): Payer: Medicare Other | Admitting: Pharmacist Clinician (PhC)/ Clinical Pharmacy Specialist

## 2012-11-23 ENCOUNTER — Telehealth: Payer: Self-pay | Admitting: Pharmacist Clinician (PhC)/ Clinical Pharmacy Specialist

## 2012-11-23 DIAGNOSIS — Z5181 Encounter for therapeutic drug level monitoring: Secondary | ICD-10-CM | POA: Diagnosis not present

## 2012-11-23 DIAGNOSIS — Z954 Presence of other heart-valve replacement: Secondary | ICD-10-CM | POA: Diagnosis not present

## 2012-11-23 DIAGNOSIS — Z952 Presence of prosthetic heart valve: Secondary | ICD-10-CM

## 2012-11-23 DIAGNOSIS — Z7901 Long term (current) use of anticoagulants: Secondary | ICD-10-CM | POA: Diagnosis not present

## 2012-11-23 LAB — POCT INR
INR: 3.1
INR: 3.1

## 2012-11-23 NOTE — Telephone Encounter (Signed)
Called patient with results.  Goal is 2.5-3.5,  Continue same dose and re-check in 1 week

## 2012-11-24 DIAGNOSIS — Z992 Dependence on renal dialysis: Secondary | ICD-10-CM | POA: Diagnosis not present

## 2012-11-24 DIAGNOSIS — D509 Iron deficiency anemia, unspecified: Secondary | ICD-10-CM | POA: Diagnosis not present

## 2012-11-24 DIAGNOSIS — N186 End stage renal disease: Secondary | ICD-10-CM | POA: Diagnosis not present

## 2012-11-24 DIAGNOSIS — N2581 Secondary hyperparathyroidism of renal origin: Secondary | ICD-10-CM | POA: Diagnosis not present

## 2012-11-24 DIAGNOSIS — D631 Anemia in chronic kidney disease: Secondary | ICD-10-CM | POA: Diagnosis not present

## 2012-11-26 DIAGNOSIS — D509 Iron deficiency anemia, unspecified: Secondary | ICD-10-CM | POA: Diagnosis not present

## 2012-11-26 DIAGNOSIS — Z992 Dependence on renal dialysis: Secondary | ICD-10-CM | POA: Diagnosis not present

## 2012-11-26 DIAGNOSIS — N186 End stage renal disease: Secondary | ICD-10-CM | POA: Diagnosis not present

## 2012-11-26 DIAGNOSIS — N2581 Secondary hyperparathyroidism of renal origin: Secondary | ICD-10-CM | POA: Diagnosis not present

## 2012-11-26 DIAGNOSIS — D631 Anemia in chronic kidney disease: Secondary | ICD-10-CM | POA: Diagnosis not present

## 2012-11-29 DIAGNOSIS — N2581 Secondary hyperparathyroidism of renal origin: Secondary | ICD-10-CM | POA: Diagnosis not present

## 2012-11-29 DIAGNOSIS — N186 End stage renal disease: Secondary | ICD-10-CM | POA: Diagnosis not present

## 2012-11-29 DIAGNOSIS — D631 Anemia in chronic kidney disease: Secondary | ICD-10-CM | POA: Diagnosis not present

## 2012-11-29 DIAGNOSIS — D509 Iron deficiency anemia, unspecified: Secondary | ICD-10-CM | POA: Diagnosis not present

## 2012-11-29 DIAGNOSIS — Z992 Dependence on renal dialysis: Secondary | ICD-10-CM | POA: Diagnosis not present

## 2012-12-01 DIAGNOSIS — N186 End stage renal disease: Secondary | ICD-10-CM | POA: Diagnosis not present

## 2012-12-01 DIAGNOSIS — D509 Iron deficiency anemia, unspecified: Secondary | ICD-10-CM | POA: Diagnosis not present

## 2012-12-01 DIAGNOSIS — D631 Anemia in chronic kidney disease: Secondary | ICD-10-CM | POA: Diagnosis not present

## 2012-12-01 DIAGNOSIS — N2581 Secondary hyperparathyroidism of renal origin: Secondary | ICD-10-CM | POA: Diagnosis not present

## 2012-12-01 DIAGNOSIS — Z992 Dependence on renal dialysis: Secondary | ICD-10-CM | POA: Diagnosis not present

## 2012-12-03 DIAGNOSIS — N2581 Secondary hyperparathyroidism of renal origin: Secondary | ICD-10-CM | POA: Diagnosis not present

## 2012-12-03 DIAGNOSIS — N186 End stage renal disease: Secondary | ICD-10-CM | POA: Diagnosis not present

## 2012-12-03 DIAGNOSIS — D509 Iron deficiency anemia, unspecified: Secondary | ICD-10-CM | POA: Diagnosis not present

## 2012-12-03 DIAGNOSIS — Z992 Dependence on renal dialysis: Secondary | ICD-10-CM | POA: Diagnosis not present

## 2012-12-03 DIAGNOSIS — D631 Anemia in chronic kidney disease: Secondary | ICD-10-CM | POA: Diagnosis not present

## 2012-12-05 DIAGNOSIS — Z7901 Long term (current) use of anticoagulants: Secondary | ICD-10-CM | POA: Diagnosis not present

## 2012-12-05 DIAGNOSIS — D72829 Elevated white blood cell count, unspecified: Secondary | ICD-10-CM | POA: Diagnosis not present

## 2012-12-05 DIAGNOSIS — Z954 Presence of other heart-valve replacement: Secondary | ICD-10-CM | POA: Diagnosis not present

## 2012-12-05 DIAGNOSIS — D638 Anemia in other chronic diseases classified elsewhere: Secondary | ICD-10-CM | POA: Diagnosis present

## 2012-12-05 DIAGNOSIS — Z48298 Encounter for aftercare following other organ transplant: Secondary | ICD-10-CM | POA: Diagnosis not present

## 2012-12-05 DIAGNOSIS — I4510 Unspecified right bundle-branch block: Secondary | ICD-10-CM | POA: Diagnosis not present

## 2012-12-05 DIAGNOSIS — D696 Thrombocytopenia, unspecified: Secondary | ICD-10-CM | POA: Diagnosis not present

## 2012-12-05 DIAGNOSIS — I120 Hypertensive chronic kidney disease with stage 5 chronic kidney disease or end stage renal disease: Secondary | ICD-10-CM | POA: Diagnosis not present

## 2012-12-05 DIAGNOSIS — T8610 Unspecified complication of kidney transplant: Secondary | ICD-10-CM | POA: Diagnosis not present

## 2012-12-05 DIAGNOSIS — R7309 Other abnormal glucose: Secondary | ICD-10-CM | POA: Diagnosis not present

## 2012-12-05 DIAGNOSIS — J9819 Other pulmonary collapse: Secondary | ICD-10-CM | POA: Diagnosis not present

## 2012-12-05 DIAGNOSIS — C649 Malignant neoplasm of unspecified kidney, except renal pelvis: Secondary | ICD-10-CM | POA: Diagnosis present

## 2012-12-05 DIAGNOSIS — Z5181 Encounter for therapeutic drug level monitoring: Secondary | ICD-10-CM | POA: Diagnosis not present

## 2012-12-05 DIAGNOSIS — N186 End stage renal disease: Secondary | ICD-10-CM | POA: Diagnosis present

## 2012-12-05 DIAGNOSIS — Z992 Dependence on renal dialysis: Secondary | ICD-10-CM | POA: Insufficient documentation

## 2012-12-05 DIAGNOSIS — R791 Abnormal coagulation profile: Secondary | ICD-10-CM | POA: Diagnosis not present

## 2012-12-05 DIAGNOSIS — Z09 Encounter for follow-up examination after completed treatment for conditions other than malignant neoplasm: Secondary | ICD-10-CM | POA: Diagnosis not present

## 2012-12-05 DIAGNOSIS — Z781 Physical restraint status: Secondary | ICD-10-CM | POA: Diagnosis not present

## 2012-12-05 DIAGNOSIS — E872 Acidosis: Secondary | ICD-10-CM | POA: Diagnosis not present

## 2012-12-05 DIAGNOSIS — J95821 Acute postprocedural respiratory failure: Secondary | ICD-10-CM | POA: Diagnosis not present

## 2012-12-05 DIAGNOSIS — Z940 Kidney transplant status: Secondary | ICD-10-CM | POA: Diagnosis not present

## 2012-12-05 DIAGNOSIS — I509 Heart failure, unspecified: Secondary | ICD-10-CM | POA: Diagnosis not present

## 2012-12-05 DIAGNOSIS — I359 Nonrheumatic aortic valve disorder, unspecified: Secondary | ICD-10-CM | POA: Diagnosis not present

## 2012-12-05 DIAGNOSIS — J90 Pleural effusion, not elsewhere classified: Secondary | ICD-10-CM | POA: Diagnosis not present

## 2012-12-05 DIAGNOSIS — D62 Acute posthemorrhagic anemia: Secondary | ICD-10-CM | POA: Diagnosis not present

## 2012-12-05 DIAGNOSIS — I2510 Atherosclerotic heart disease of native coronary artery without angina pectoris: Secondary | ICD-10-CM | POA: Diagnosis present

## 2012-12-05 DIAGNOSIS — Z01811 Encounter for preprocedural respiratory examination: Secondary | ICD-10-CM | POA: Diagnosis not present

## 2012-12-05 DIAGNOSIS — Z79899 Other long term (current) drug therapy: Secondary | ICD-10-CM | POA: Diagnosis not present

## 2012-12-05 DIAGNOSIS — E876 Hypokalemia: Secondary | ICD-10-CM | POA: Diagnosis not present

## 2012-12-05 DIAGNOSIS — I1311 Hypertensive heart and chronic kidney disease without heart failure, with stage 5 chronic kidney disease, or end stage renal disease: Secondary | ICD-10-CM | POA: Diagnosis not present

## 2012-12-05 DIAGNOSIS — I5020 Unspecified systolic (congestive) heart failure: Secondary | ICD-10-CM | POA: Diagnosis not present

## 2012-12-13 DIAGNOSIS — Z940 Kidney transplant status: Secondary | ICD-10-CM | POA: Diagnosis not present

## 2012-12-13 DIAGNOSIS — Z954 Presence of other heart-valve replacement: Secondary | ICD-10-CM | POA: Diagnosis not present

## 2012-12-13 DIAGNOSIS — Z48298 Encounter for aftercare following other organ transplant: Secondary | ICD-10-CM | POA: Diagnosis not present

## 2012-12-13 DIAGNOSIS — M545 Low back pain: Secondary | ICD-10-CM | POA: Diagnosis not present

## 2012-12-13 DIAGNOSIS — D631 Anemia in chronic kidney disease: Secondary | ICD-10-CM | POA: Diagnosis not present

## 2012-12-13 DIAGNOSIS — Z9861 Coronary angioplasty status: Secondary | ICD-10-CM | POA: Diagnosis not present

## 2012-12-13 DIAGNOSIS — M461 Sacroiliitis, not elsewhere classified: Secondary | ICD-10-CM | POA: Diagnosis not present

## 2012-12-13 DIAGNOSIS — IMO0002 Reserved for concepts with insufficient information to code with codable children: Secondary | ICD-10-CM | POA: Diagnosis not present

## 2012-12-13 DIAGNOSIS — I209 Angina pectoris, unspecified: Secondary | ICD-10-CM | POA: Diagnosis not present

## 2012-12-13 DIAGNOSIS — I359 Nonrheumatic aortic valve disorder, unspecified: Secondary | ICD-10-CM | POA: Diagnosis not present

## 2012-12-13 DIAGNOSIS — N186 End stage renal disease: Secondary | ICD-10-CM | POA: Diagnosis not present

## 2012-12-15 ENCOUNTER — Telehealth: Payer: Self-pay | Admitting: Pharmacist

## 2012-12-15 DIAGNOSIS — Z5181 Encounter for therapeutic drug level monitoring: Secondary | ICD-10-CM | POA: Diagnosis not present

## 2012-12-15 DIAGNOSIS — E785 Hyperlipidemia, unspecified: Secondary | ICD-10-CM | POA: Diagnosis not present

## 2012-12-15 DIAGNOSIS — Z7901 Long term (current) use of anticoagulants: Secondary | ICD-10-CM | POA: Diagnosis not present

## 2012-12-15 DIAGNOSIS — D631 Anemia in chronic kidney disease: Secondary | ICD-10-CM | POA: Diagnosis not present

## 2012-12-15 DIAGNOSIS — Z48298 Encounter for aftercare following other organ transplant: Secondary | ICD-10-CM | POA: Diagnosis not present

## 2012-12-15 DIAGNOSIS — Z79899 Other long term (current) drug therapy: Secondary | ICD-10-CM | POA: Diagnosis not present

## 2012-12-15 DIAGNOSIS — Z940 Kidney transplant status: Secondary | ICD-10-CM | POA: Insufficient documentation

## 2012-12-15 DIAGNOSIS — N186 End stage renal disease: Secondary | ICD-10-CM | POA: Diagnosis not present

## 2012-12-15 DIAGNOSIS — I10 Essential (primary) hypertension: Secondary | ICD-10-CM | POA: Diagnosis not present

## 2012-12-15 NOTE — Telephone Encounter (Signed)
Patient was checking INR at home with [facility] system but we have not received a result in about 3 weeks.  Called to see if he has been checking. No ans - left message to call me.

## 2012-12-17 DIAGNOSIS — Z5181 Encounter for therapeutic drug level monitoring: Secondary | ICD-10-CM | POA: Diagnosis not present

## 2012-12-17 DIAGNOSIS — Z7901 Long term (current) use of anticoagulants: Secondary | ICD-10-CM | POA: Diagnosis not present

## 2012-12-17 DIAGNOSIS — E785 Hyperlipidemia, unspecified: Secondary | ICD-10-CM | POA: Diagnosis not present

## 2012-12-17 DIAGNOSIS — I10 Essential (primary) hypertension: Secondary | ICD-10-CM | POA: Diagnosis not present

## 2012-12-17 DIAGNOSIS — N186 End stage renal disease: Secondary | ICD-10-CM | POA: Diagnosis not present

## 2012-12-17 DIAGNOSIS — Z48298 Encounter for aftercare following other organ transplant: Secondary | ICD-10-CM | POA: Diagnosis not present

## 2012-12-17 DIAGNOSIS — D631 Anemia in chronic kidney disease: Secondary | ICD-10-CM | POA: Diagnosis not present

## 2012-12-17 DIAGNOSIS — Z79899 Other long term (current) drug therapy: Secondary | ICD-10-CM | POA: Diagnosis not present

## 2012-12-17 DIAGNOSIS — Z940 Kidney transplant status: Secondary | ICD-10-CM | POA: Diagnosis not present

## 2012-12-20 DIAGNOSIS — Z79899 Other long term (current) drug therapy: Secondary | ICD-10-CM | POA: Diagnosis not present

## 2012-12-20 DIAGNOSIS — Z954 Presence of other heart-valve replacement: Secondary | ICD-10-CM | POA: Diagnosis not present

## 2012-12-20 DIAGNOSIS — E119 Type 2 diabetes mellitus without complications: Secondary | ICD-10-CM | POA: Diagnosis not present

## 2012-12-20 DIAGNOSIS — Z5181 Encounter for therapeutic drug level monitoring: Secondary | ICD-10-CM | POA: Diagnosis not present

## 2012-12-20 DIAGNOSIS — I2510 Atherosclerotic heart disease of native coronary artery without angina pectoris: Secondary | ICD-10-CM | POA: Diagnosis not present

## 2012-12-20 DIAGNOSIS — E785 Hyperlipidemia, unspecified: Secondary | ICD-10-CM | POA: Diagnosis not present

## 2012-12-20 DIAGNOSIS — I10 Essential (primary) hypertension: Secondary | ICD-10-CM | POA: Diagnosis not present

## 2012-12-20 DIAGNOSIS — Z940 Kidney transplant status: Secondary | ICD-10-CM | POA: Diagnosis not present

## 2012-12-20 DIAGNOSIS — Z48298 Encounter for aftercare following other organ transplant: Secondary | ICD-10-CM | POA: Diagnosis not present

## 2012-12-20 DIAGNOSIS — Z7901 Long term (current) use of anticoagulants: Secondary | ICD-10-CM | POA: Diagnosis not present

## 2012-12-22 DIAGNOSIS — I10 Essential (primary) hypertension: Secondary | ICD-10-CM | POA: Diagnosis not present

## 2012-12-22 DIAGNOSIS — Z7901 Long term (current) use of anticoagulants: Secondary | ICD-10-CM | POA: Diagnosis not present

## 2012-12-22 DIAGNOSIS — Z79899 Other long term (current) drug therapy: Secondary | ICD-10-CM | POA: Diagnosis not present

## 2012-12-22 DIAGNOSIS — Z48298 Encounter for aftercare following other organ transplant: Secondary | ICD-10-CM | POA: Diagnosis not present

## 2012-12-22 DIAGNOSIS — Z940 Kidney transplant status: Secondary | ICD-10-CM | POA: Diagnosis not present

## 2012-12-22 DIAGNOSIS — Z5181 Encounter for therapeutic drug level monitoring: Secondary | ICD-10-CM | POA: Diagnosis not present

## 2012-12-22 DIAGNOSIS — E785 Hyperlipidemia, unspecified: Secondary | ICD-10-CM | POA: Diagnosis not present

## 2012-12-22 NOTE — Telephone Encounter (Signed)
Spoke with patient.  He is currently going to clinic for transplant every other day and they are checking INR every other day and dosing warfarin.  Once he stops going to that clinic as often he will restart checking INR at home and results will be faxed to our office.

## 2012-12-24 DIAGNOSIS — I120 Hypertensive chronic kidney disease with stage 5 chronic kidney disease or end stage renal disease: Secondary | ICD-10-CM | POA: Diagnosis not present

## 2012-12-24 DIAGNOSIS — Z7982 Long term (current) use of aspirin: Secondary | ICD-10-CM | POA: Diagnosis not present

## 2012-12-24 DIAGNOSIS — I509 Heart failure, unspecified: Secondary | ICD-10-CM | POA: Diagnosis not present

## 2012-12-24 DIAGNOSIS — Z940 Kidney transplant status: Secondary | ICD-10-CM | POA: Diagnosis not present

## 2012-12-24 DIAGNOSIS — Z79899 Other long term (current) drug therapy: Secondary | ICD-10-CM | POA: Diagnosis not present

## 2012-12-24 DIAGNOSIS — I359 Nonrheumatic aortic valve disorder, unspecified: Secondary | ICD-10-CM | POA: Diagnosis not present

## 2012-12-24 DIAGNOSIS — Z48298 Encounter for aftercare following other organ transplant: Secondary | ICD-10-CM | POA: Diagnosis not present

## 2012-12-24 DIAGNOSIS — Z7901 Long term (current) use of anticoagulants: Secondary | ICD-10-CM | POA: Diagnosis not present

## 2012-12-24 DIAGNOSIS — N186 End stage renal disease: Secondary | ICD-10-CM | POA: Diagnosis not present

## 2012-12-24 DIAGNOSIS — I209 Angina pectoris, unspecified: Secondary | ICD-10-CM | POA: Diagnosis not present

## 2012-12-24 DIAGNOSIS — C649 Malignant neoplasm of unspecified kidney, except renal pelvis: Secondary | ICD-10-CM | POA: Diagnosis not present

## 2012-12-27 ENCOUNTER — Other Ambulatory Visit: Payer: Self-pay | Admitting: Family Medicine

## 2012-12-27 DIAGNOSIS — Z48298 Encounter for aftercare following other organ transplant: Secondary | ICD-10-CM | POA: Diagnosis not present

## 2012-12-27 DIAGNOSIS — Z79899 Other long term (current) drug therapy: Secondary | ICD-10-CM | POA: Diagnosis not present

## 2012-12-27 DIAGNOSIS — I10 Essential (primary) hypertension: Secondary | ICD-10-CM | POA: Diagnosis not present

## 2012-12-27 DIAGNOSIS — D649 Anemia, unspecified: Secondary | ICD-10-CM | POA: Diagnosis not present

## 2012-12-27 DIAGNOSIS — I120 Hypertensive chronic kidney disease with stage 5 chronic kidney disease or end stage renal disease: Secondary | ICD-10-CM | POA: Diagnosis not present

## 2012-12-27 DIAGNOSIS — Z7901 Long term (current) use of anticoagulants: Secondary | ICD-10-CM | POA: Diagnosis not present

## 2012-12-27 DIAGNOSIS — N186 End stage renal disease: Secondary | ICD-10-CM | POA: Diagnosis not present

## 2012-12-27 DIAGNOSIS — Z5181 Encounter for therapeutic drug level monitoring: Secondary | ICD-10-CM | POA: Diagnosis not present

## 2012-12-27 DIAGNOSIS — Z940 Kidney transplant status: Secondary | ICD-10-CM | POA: Diagnosis not present

## 2012-12-28 NOTE — Telephone Encounter (Signed)
Last seen 11/23/12 Sharyn Lull and last INR 11/23/12

## 2012-12-30 DIAGNOSIS — Z48298 Encounter for aftercare following other organ transplant: Secondary | ICD-10-CM | POA: Diagnosis not present

## 2012-12-30 DIAGNOSIS — Z940 Kidney transplant status: Secondary | ICD-10-CM | POA: Diagnosis not present

## 2012-12-30 DIAGNOSIS — R251 Tremor, unspecified: Secondary | ICD-10-CM | POA: Insufficient documentation

## 2012-12-30 DIAGNOSIS — Z5181 Encounter for therapeutic drug level monitoring: Secondary | ICD-10-CM | POA: Diagnosis not present

## 2012-12-30 DIAGNOSIS — Z954 Presence of other heart-valve replacement: Secondary | ICD-10-CM | POA: Diagnosis not present

## 2012-12-30 DIAGNOSIS — R259 Unspecified abnormal involuntary movements: Secondary | ICD-10-CM | POA: Diagnosis not present

## 2012-12-30 DIAGNOSIS — Z7901 Long term (current) use of anticoagulants: Secondary | ICD-10-CM | POA: Diagnosis not present

## 2012-12-30 DIAGNOSIS — Z79899 Other long term (current) drug therapy: Secondary | ICD-10-CM | POA: Diagnosis not present

## 2012-12-30 DIAGNOSIS — I10 Essential (primary) hypertension: Secondary | ICD-10-CM | POA: Diagnosis not present

## 2013-01-03 DIAGNOSIS — Z48298 Encounter for aftercare following other organ transplant: Secondary | ICD-10-CM | POA: Diagnosis not present

## 2013-01-03 DIAGNOSIS — Z954 Presence of other heart-valve replacement: Secondary | ICD-10-CM | POA: Diagnosis not present

## 2013-01-03 DIAGNOSIS — R259 Unspecified abnormal involuntary movements: Secondary | ICD-10-CM | POA: Diagnosis not present

## 2013-01-03 DIAGNOSIS — Z79899 Other long term (current) drug therapy: Secondary | ICD-10-CM | POA: Diagnosis not present

## 2013-01-03 DIAGNOSIS — I059 Rheumatic mitral valve disease, unspecified: Secondary | ICD-10-CM | POA: Diagnosis not present

## 2013-01-03 DIAGNOSIS — I10 Essential (primary) hypertension: Secondary | ICD-10-CM | POA: Diagnosis not present

## 2013-01-03 DIAGNOSIS — E872 Acidosis: Secondary | ICD-10-CM | POA: Insufficient documentation

## 2013-01-03 DIAGNOSIS — Z940 Kidney transplant status: Secondary | ICD-10-CM | POA: Diagnosis not present

## 2013-01-03 DIAGNOSIS — M7989 Other specified soft tissue disorders: Secondary | ICD-10-CM | POA: Diagnosis not present

## 2013-01-06 DIAGNOSIS — Z48298 Encounter for aftercare following other organ transplant: Secondary | ICD-10-CM | POA: Diagnosis not present

## 2013-01-06 DIAGNOSIS — E872 Acidosis: Secondary | ICD-10-CM | POA: Diagnosis not present

## 2013-01-06 DIAGNOSIS — I10 Essential (primary) hypertension: Secondary | ICD-10-CM | POA: Diagnosis not present

## 2013-01-06 DIAGNOSIS — Z7901 Long term (current) use of anticoagulants: Secondary | ICD-10-CM | POA: Diagnosis not present

## 2013-01-06 DIAGNOSIS — Z940 Kidney transplant status: Secondary | ICD-10-CM | POA: Diagnosis not present

## 2013-01-06 DIAGNOSIS — Z79899 Other long term (current) drug therapy: Secondary | ICD-10-CM | POA: Diagnosis not present

## 2013-01-10 DIAGNOSIS — Z48298 Encounter for aftercare following other organ transplant: Secondary | ICD-10-CM | POA: Diagnosis not present

## 2013-01-10 DIAGNOSIS — Z5181 Encounter for therapeutic drug level monitoring: Secondary | ICD-10-CM | POA: Diagnosis not present

## 2013-01-10 DIAGNOSIS — Z940 Kidney transplant status: Secondary | ICD-10-CM | POA: Diagnosis not present

## 2013-01-10 DIAGNOSIS — Z79899 Other long term (current) drug therapy: Secondary | ICD-10-CM | POA: Diagnosis not present

## 2013-01-10 DIAGNOSIS — Z954 Presence of other heart-valve replacement: Secondary | ICD-10-CM | POA: Diagnosis not present

## 2013-01-10 DIAGNOSIS — Z7901 Long term (current) use of anticoagulants: Secondary | ICD-10-CM | POA: Diagnosis not present

## 2013-01-10 DIAGNOSIS — I10 Essential (primary) hypertension: Secondary | ICD-10-CM | POA: Diagnosis not present

## 2013-01-10 DIAGNOSIS — E785 Hyperlipidemia, unspecified: Secondary | ICD-10-CM | POA: Diagnosis not present

## 2013-01-17 DIAGNOSIS — E872 Acidosis: Secondary | ICD-10-CM | POA: Diagnosis not present

## 2013-01-17 DIAGNOSIS — Z79899 Other long term (current) drug therapy: Secondary | ICD-10-CM | POA: Diagnosis not present

## 2013-01-17 DIAGNOSIS — D649 Anemia, unspecified: Secondary | ICD-10-CM | POA: Diagnosis not present

## 2013-01-17 DIAGNOSIS — Z48298 Encounter for aftercare following other organ transplant: Secondary | ICD-10-CM | POA: Diagnosis not present

## 2013-01-17 DIAGNOSIS — K439 Ventral hernia without obstruction or gangrene: Secondary | ICD-10-CM | POA: Insufficient documentation

## 2013-01-17 DIAGNOSIS — I10 Essential (primary) hypertension: Secondary | ICD-10-CM | POA: Diagnosis not present

## 2013-01-17 DIAGNOSIS — E785 Hyperlipidemia, unspecified: Secondary | ICD-10-CM | POA: Diagnosis not present

## 2013-01-17 DIAGNOSIS — Z940 Kidney transplant status: Secondary | ICD-10-CM | POA: Diagnosis not present

## 2013-01-18 DIAGNOSIS — D090 Carcinoma in situ of bladder: Secondary | ICD-10-CM | POA: Diagnosis not present

## 2013-01-18 DIAGNOSIS — R82998 Other abnormal findings in urine: Secondary | ICD-10-CM | POA: Diagnosis not present

## 2013-01-18 DIAGNOSIS — Z940 Kidney transplant status: Secondary | ICD-10-CM | POA: Diagnosis not present

## 2013-01-18 DIAGNOSIS — Z48298 Encounter for aftercare following other organ transplant: Secondary | ICD-10-CM | POA: Diagnosis not present

## 2013-01-18 DIAGNOSIS — R319 Hematuria, unspecified: Secondary | ICD-10-CM | POA: Diagnosis not present

## 2013-01-24 DIAGNOSIS — Z79899 Other long term (current) drug therapy: Secondary | ICD-10-CM | POA: Diagnosis not present

## 2013-01-24 DIAGNOSIS — I2510 Atherosclerotic heart disease of native coronary artery without angina pectoris: Secondary | ICD-10-CM | POA: Diagnosis not present

## 2013-01-24 DIAGNOSIS — N189 Chronic kidney disease, unspecified: Secondary | ICD-10-CM | POA: Diagnosis not present

## 2013-01-24 DIAGNOSIS — K439 Ventral hernia without obstruction or gangrene: Secondary | ICD-10-CM | POA: Diagnosis not present

## 2013-01-24 DIAGNOSIS — D649 Anemia, unspecified: Secondary | ICD-10-CM | POA: Diagnosis not present

## 2013-01-24 DIAGNOSIS — I129 Hypertensive chronic kidney disease with stage 1 through stage 4 chronic kidney disease, or unspecified chronic kidney disease: Secondary | ICD-10-CM | POA: Diagnosis not present

## 2013-01-24 DIAGNOSIS — R0609 Other forms of dyspnea: Secondary | ICD-10-CM | POA: Diagnosis not present

## 2013-01-24 DIAGNOSIS — Z940 Kidney transplant status: Secondary | ICD-10-CM | POA: Diagnosis not present

## 2013-01-24 DIAGNOSIS — Z7901 Long term (current) use of anticoagulants: Secondary | ICD-10-CM | POA: Diagnosis not present

## 2013-01-24 DIAGNOSIS — Z954 Presence of other heart-valve replacement: Secondary | ICD-10-CM | POA: Diagnosis not present

## 2013-01-24 DIAGNOSIS — E872 Acidosis: Secondary | ICD-10-CM | POA: Diagnosis not present

## 2013-01-24 DIAGNOSIS — Z5181 Encounter for therapeutic drug level monitoring: Secondary | ICD-10-CM | POA: Diagnosis not present

## 2013-01-24 DIAGNOSIS — Z48298 Encounter for aftercare following other organ transplant: Secondary | ICD-10-CM | POA: Diagnosis not present

## 2013-02-01 DIAGNOSIS — I200 Unstable angina: Secondary | ICD-10-CM | POA: Diagnosis not present

## 2013-02-01 DIAGNOSIS — Z940 Kidney transplant status: Secondary | ICD-10-CM | POA: Diagnosis not present

## 2013-02-01 DIAGNOSIS — E872 Acidosis: Secondary | ICD-10-CM | POA: Diagnosis not present

## 2013-02-01 DIAGNOSIS — E041 Nontoxic single thyroid nodule: Secondary | ICD-10-CM | POA: Diagnosis not present

## 2013-02-01 DIAGNOSIS — Z9861 Coronary angioplasty status: Secondary | ICD-10-CM | POA: Diagnosis not present

## 2013-02-01 DIAGNOSIS — C649 Malignant neoplasm of unspecified kidney, except renal pelvis: Secondary | ICD-10-CM | POA: Diagnosis not present

## 2013-02-01 DIAGNOSIS — R259 Unspecified abnormal involuntary movements: Secondary | ICD-10-CM | POA: Diagnosis not present

## 2013-02-01 DIAGNOSIS — R0609 Other forms of dyspnea: Secondary | ICD-10-CM | POA: Diagnosis not present

## 2013-02-01 DIAGNOSIS — M461 Sacroiliitis, not elsewhere classified: Secondary | ICD-10-CM | POA: Diagnosis not present

## 2013-02-03 DIAGNOSIS — R319 Hematuria, unspecified: Secondary | ICD-10-CM | POA: Diagnosis not present

## 2013-02-03 DIAGNOSIS — R899 Unspecified abnormal finding in specimens from other organs, systems and tissues: Secondary | ICD-10-CM | POA: Diagnosis not present

## 2013-02-09 DIAGNOSIS — Z954 Presence of other heart-valve replacement: Secondary | ICD-10-CM | POA: Diagnosis not present

## 2013-02-09 DIAGNOSIS — Z940 Kidney transplant status: Secondary | ICD-10-CM | POA: Diagnosis not present

## 2013-02-09 DIAGNOSIS — I4949 Other premature depolarization: Secondary | ICD-10-CM | POA: Diagnosis not present

## 2013-02-09 DIAGNOSIS — I2510 Atherosclerotic heart disease of native coronary artery without angina pectoris: Secondary | ICD-10-CM | POA: Diagnosis not present

## 2013-02-09 DIAGNOSIS — I359 Nonrheumatic aortic valve disorder, unspecified: Secondary | ICD-10-CM | POA: Diagnosis not present

## 2013-02-09 LAB — POCT INR: INR: 2.6

## 2013-02-10 ENCOUNTER — Telehealth: Payer: Self-pay | Admitting: *Deleted

## 2013-02-10 ENCOUNTER — Other Ambulatory Visit: Payer: Self-pay

## 2013-02-10 NOTE — Telephone Encounter (Signed)
Patient had recent surgery is coming into our clinic for Pt/INR PT was 2.8 yesterday. Normal range is 2-3. They are checking this weekly. Appt scheduled for next Tuesday.

## 2013-02-13 ENCOUNTER — Ambulatory Visit (INDEPENDENT_AMBULATORY_CARE_PROVIDER_SITE_OTHER): Payer: Medicare Other | Admitting: Pharmacist

## 2013-02-13 DIAGNOSIS — Z952 Presence of prosthetic heart valve: Secondary | ICD-10-CM

## 2013-02-13 DIAGNOSIS — Z5181 Encounter for therapeutic drug level monitoring: Secondary | ICD-10-CM

## 2013-02-13 DIAGNOSIS — Z7901 Long term (current) use of anticoagulants: Secondary | ICD-10-CM

## 2013-02-13 DIAGNOSIS — Z954 Presence of other heart-valve replacement: Secondary | ICD-10-CM

## 2013-02-13 NOTE — Progress Notes (Signed)
Results called from transplant team. INR was 2.6 and called to our office. Weekly INR checks

## 2013-02-14 ENCOUNTER — Telehealth: Payer: Self-pay | Admitting: Family Medicine

## 2013-02-14 NOTE — Telephone Encounter (Signed)
protime rescheduled for earlier tomorrow.

## 2013-02-15 ENCOUNTER — Ambulatory Visit (INDEPENDENT_AMBULATORY_CARE_PROVIDER_SITE_OTHER): Payer: Medicare Other | Admitting: Pharmacist Clinician (PhC)/ Clinical Pharmacy Specialist

## 2013-02-15 DIAGNOSIS — I359 Nonrheumatic aortic valve disorder, unspecified: Secondary | ICD-10-CM

## 2013-02-15 DIAGNOSIS — Z7901 Long term (current) use of anticoagulants: Secondary | ICD-10-CM | POA: Diagnosis not present

## 2013-02-15 DIAGNOSIS — Z952 Presence of prosthetic heart valve: Secondary | ICD-10-CM

## 2013-02-15 DIAGNOSIS — Z954 Presence of other heart-valve replacement: Secondary | ICD-10-CM | POA: Diagnosis not present

## 2013-02-15 DIAGNOSIS — Z5181 Encounter for therapeutic drug level monitoring: Secondary | ICD-10-CM

## 2013-02-15 LAB — POCT INR: INR: 3.3

## 2013-02-15 NOTE — Progress Notes (Signed)
Patient's INR goal is 2-3 and not the previously entered goal of 2.5-3.5.  He has an aortic valve replacement

## 2013-02-22 DIAGNOSIS — R609 Edema, unspecified: Secondary | ICD-10-CM | POA: Insufficient documentation

## 2013-02-22 DIAGNOSIS — I120 Hypertensive chronic kidney disease with stage 5 chronic kidney disease or end stage renal disease: Secondary | ICD-10-CM | POA: Diagnosis not present

## 2013-02-22 DIAGNOSIS — N189 Chronic kidney disease, unspecified: Secondary | ICD-10-CM | POA: Diagnosis not present

## 2013-02-22 DIAGNOSIS — N186 End stage renal disease: Secondary | ICD-10-CM | POA: Diagnosis not present

## 2013-02-22 DIAGNOSIS — Z940 Kidney transplant status: Secondary | ICD-10-CM | POA: Diagnosis not present

## 2013-02-22 DIAGNOSIS — D899 Disorder involving the immune mechanism, unspecified: Secondary | ICD-10-CM | POA: Diagnosis not present

## 2013-02-22 DIAGNOSIS — I129 Hypertensive chronic kidney disease with stage 1 through stage 4 chronic kidney disease, or unspecified chronic kidney disease: Secondary | ICD-10-CM | POA: Diagnosis not present

## 2013-02-22 DIAGNOSIS — Z48298 Encounter for aftercare following other organ transplant: Secondary | ICD-10-CM | POA: Diagnosis not present

## 2013-02-24 DIAGNOSIS — R6889 Other general symptoms and signs: Secondary | ICD-10-CM | POA: Diagnosis not present

## 2013-02-24 DIAGNOSIS — Z940 Kidney transplant status: Secondary | ICD-10-CM | POA: Diagnosis not present

## 2013-02-24 DIAGNOSIS — Z471 Aftercare following joint replacement surgery: Secondary | ICD-10-CM | POA: Diagnosis not present

## 2013-03-01 ENCOUNTER — Ambulatory Visit (INDEPENDENT_AMBULATORY_CARE_PROVIDER_SITE_OTHER): Payer: Medicare Other | Admitting: Pharmacist Clinician (PhC)/ Clinical Pharmacy Specialist

## 2013-03-01 DIAGNOSIS — Z7901 Long term (current) use of anticoagulants: Secondary | ICD-10-CM | POA: Diagnosis not present

## 2013-03-01 DIAGNOSIS — I359 Nonrheumatic aortic valve disorder, unspecified: Secondary | ICD-10-CM

## 2013-03-01 DIAGNOSIS — Z954 Presence of other heart-valve replacement: Secondary | ICD-10-CM

## 2013-03-01 DIAGNOSIS — Z952 Presence of prosthetic heart valve: Secondary | ICD-10-CM

## 2013-03-01 DIAGNOSIS — Z5181 Encounter for therapeutic drug level monitoring: Secondary | ICD-10-CM

## 2013-03-01 LAB — POCT INR: INR: 2

## 2013-03-07 DIAGNOSIS — Z5181 Encounter for therapeutic drug level monitoring: Secondary | ICD-10-CM | POA: Diagnosis not present

## 2013-03-07 DIAGNOSIS — I10 Essential (primary) hypertension: Secondary | ICD-10-CM | POA: Diagnosis not present

## 2013-03-07 DIAGNOSIS — E785 Hyperlipidemia, unspecified: Secondary | ICD-10-CM | POA: Diagnosis not present

## 2013-03-07 DIAGNOSIS — Z7901 Long term (current) use of anticoagulants: Secondary | ICD-10-CM | POA: Diagnosis not present

## 2013-03-07 DIAGNOSIS — Z940 Kidney transplant status: Secondary | ICD-10-CM | POA: Diagnosis not present

## 2013-03-07 DIAGNOSIS — D899 Disorder involving the immune mechanism, unspecified: Secondary | ICD-10-CM | POA: Diagnosis not present

## 2013-03-17 ENCOUNTER — Ambulatory Visit (INDEPENDENT_AMBULATORY_CARE_PROVIDER_SITE_OTHER): Payer: Medicare Other | Admitting: Family Medicine

## 2013-03-17 ENCOUNTER — Encounter: Payer: Self-pay | Admitting: Family Medicine

## 2013-03-17 VITALS — BP 124/60 | HR 74 | Temp 97.0°F | Ht 69.5 in | Wt 191.6 lb

## 2013-03-17 DIAGNOSIS — Z79899 Other long term (current) drug therapy: Secondary | ICD-10-CM

## 2013-03-17 DIAGNOSIS — E041 Nontoxic single thyroid nodule: Secondary | ICD-10-CM

## 2013-03-17 DIAGNOSIS — H6980 Other specified disorders of Eustachian tube, unspecified ear: Secondary | ICD-10-CM

## 2013-03-17 DIAGNOSIS — Z7901 Long term (current) use of anticoagulants: Secondary | ICD-10-CM

## 2013-03-17 DIAGNOSIS — N183 Chronic kidney disease, stage 3 unspecified: Secondary | ICD-10-CM

## 2013-03-17 DIAGNOSIS — N186 End stage renal disease: Secondary | ICD-10-CM

## 2013-03-17 DIAGNOSIS — Z992 Dependence on renal dialysis: Secondary | ICD-10-CM

## 2013-03-17 DIAGNOSIS — I2510 Atherosclerotic heart disease of native coronary artery without angina pectoris: Secondary | ICD-10-CM

## 2013-03-17 DIAGNOSIS — M545 Low back pain, unspecified: Secondary | ICD-10-CM

## 2013-03-17 DIAGNOSIS — I359 Nonrheumatic aortic valve disorder, unspecified: Secondary | ICD-10-CM

## 2013-03-17 DIAGNOSIS — E785 Hyperlipidemia, unspecified: Secondary | ICD-10-CM | POA: Diagnosis not present

## 2013-03-17 DIAGNOSIS — G4733 Obstructive sleep apnea (adult) (pediatric): Secondary | ICD-10-CM | POA: Insufficient documentation

## 2013-03-17 DIAGNOSIS — Z5181 Encounter for therapeutic drug level monitoring: Secondary | ICD-10-CM | POA: Diagnosis not present

## 2013-03-17 DIAGNOSIS — Z954 Presence of other heart-valve replacement: Secondary | ICD-10-CM | POA: Diagnosis not present

## 2013-03-17 DIAGNOSIS — I10 Essential (primary) hypertension: Secondary | ICD-10-CM

## 2013-03-17 DIAGNOSIS — C649 Malignant neoplasm of unspecified kidney, except renal pelvis: Secondary | ICD-10-CM | POA: Insufficient documentation

## 2013-03-17 DIAGNOSIS — Z940 Kidney transplant status: Secondary | ICD-10-CM | POA: Diagnosis not present

## 2013-03-17 DIAGNOSIS — H6981 Other specified disorders of Eustachian tube, right ear: Secondary | ICD-10-CM

## 2013-03-17 DIAGNOSIS — H6991 Unspecified Eustachian tube disorder, right ear: Secondary | ICD-10-CM

## 2013-03-17 DIAGNOSIS — Z952 Presence of prosthetic heart valve: Secondary | ICD-10-CM

## 2013-03-17 DIAGNOSIS — H6990 Unspecified Eustachian tube disorder, unspecified ear: Secondary | ICD-10-CM | POA: Insufficient documentation

## 2013-03-17 LAB — POCT INR: INR: 1.9

## 2013-03-17 MED ORDER — FLUTICASONE PROPIONATE 50 MCG/ACT NA SUSP: 2.0000 | Freq: Every day | NASAL | Status: DC

## 2013-03-17 MED ORDER — WARFARIN SODIUM 1 MG PO TABS: ORAL_TABLET | ORAL | Status: DC

## 2013-03-17 NOTE — Progress Notes (Signed)
Patient ID: Jeremy [name], male   DOB: [birth year], 56 y.o.   MRN: [id number] SUBJECTIVE: CC: Chief Complaint  Patient presents with  . Follow-up    needs protime today and will be getting these at home statting in 2 weeks and needs CPAP     HPI:  Since last OV has had a renal transplant. Creatinine is starting to rise a little. Followed by the transplant team. Going tomorrow for more labs.  Needs protime today. OSA on CPAP setting 13 cm and has this Dx for >4 years. Change of supplier needs okay Rx for new supplies.  Past Medical History  Diagnosis Date  . 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  Marland Kitchen Hx of colonoscopy     2009 by Dr. Laural Golden. Normal except for small submucosal lipoma. No evidence of polyps or colitis.  . Hyperlipidemia   . Hypertension   . Arthritis   . Chronic anticoagulation 06/2012  . Dialysis patient 01 01 2008  . Blood transfusion without reported diagnosis   . Cancer   . OSA (obstructive sleep apnea)    Past Surgical History  Procedure Laterality Date  . Dg av dialysis  shunt access exist*l* or      left arm  . Back surgery    . Coronary angioplasty with stent placement    . Kidney transplant    . Aortic valve replacement  3 /11 /2014    At [facility]   History   Social History  . Marital Status: Divorced    Spouse Name: N/A    Number of Children: N/A  . Years of Education: N/A   Occupational History  . courier     [employer]   Social History Main Topics  . Smoking status: Never Smoker   . Smokeless tobacco: Not on file  . Alcohol Use: No  . Drug Use: No  . Sexual Activity: Not on file   Other Topics Concern  . Not on file   Social History Narrative   Single   1 daughter 68    No family history on file. Current Outpatient  Prescriptions on File Prior to Visit  Medication Sig Dispense Refill  . aspirin 81 MG tablet Take 81 mg by mouth daily.      . cinacalcet (SENSIPAR) 30 MG tablet Take 30 mg by mouth daily.        . folic acid-vitamin b complex-vitamin c-selenium-zinc (DIALYVITE) 3 MG TABS Take 1 tablet by mouth daily.        . furosemide (LASIX) 80 MG tablet Take 80 mg by mouth as needed.      Marland Kitchen HYDROcodone-acetaminophen (NORCO/VICODIN) 5-325 MG per tablet Take 1 tablet by mouth every 8 (eight) hours as needed for pain.  30 tablet  0  . labetalol (NORMODYNE) 300 MG tablet Take 2 tablets (600 mg total) by mouth 2 (two) times daily.  120 tablet  3  . magnesium oxide (MAG-OX) 400 MG tablet Take 400 mg by mouth daily.      . mycophenolate (MYFORTIC) 180 MG EC tablet Take 180 mg by mouth. Take 8 tablets daily      . NIFEdipine (PROCARDIA-XL/ADALAT CC) 60 MG 24 hr tablet Take 60 mg by mouth 2 (two) times daily.       . nitroGLYCERIN (  NITROSTAT) 0.4 MG SL tablet Place 1 tablet (0.4 mg total) under the tongue every 5 (five) minutes as needed for chest pain.  25 tablet  3  . omeprazole (PRILOSEC) 20 MG capsule Take 20 mg by mouth daily.      . polyethylene glycol powder (GLYCOLAX/MIRALAX) powder       . predniSONE (DELTASONE) 5 MG tablet Take 5 mg by mouth daily.      . tacrolimus (PROGRAF) 1 MG capsule Take 5 mg by mouth daily.      . tamsulosin (FLOMAX) 0.4 MG CAPS capsule Take 0.4 mg by mouth daily.      . traMADol (ULTRAM) 50 MG tablet        No current facility-administered medications on file prior to visit.   Allergies  Allergen Reactions  . Atorvastatin Other (See Comments)    Muscle Aches  . Lipitor [Atorvastatin Calcium]     Body aches    Immunization History  Administered Date(s) Administered  . Td 04/20/2006   Prior to Admission medications   Medication Sig Start Date End Date Taking? Authorizing Provider  aspirin 81 MG tablet Take 81 mg by mouth daily.    Historical Provider, MD  cinacalcet  (SENSIPAR) 30 MG tablet Take 30 mg by mouth daily.      Historical Provider, MD  folic acid-vitamin b complex-vitamin c-selenium-zinc (DIALYVITE) 3 MG TABS Take 1 tablet by mouth daily.      Historical Provider, MD  furosemide (LASIX) 80 MG tablet Take 80 mg by mouth as needed.    Historical Provider, MD  HYDROcodone-acetaminophen (NORCO/VICODIN) 5-325 MG per tablet Take 1 tablet by mouth every 8 (eight) hours as needed for pain. 08/06/12   Erby Pian, FNP  labetalol (NORMODYNE) 300 MG tablet Take 2 tablets (600 mg total) by mouth 2 (two) times daily. 07/26/12   Vernie Shanks, MD  magnesium oxide (MAG-OX) 400 MG tablet Take 400 mg by mouth daily.    Historical Provider, MD  mycophenolate (MYFORTIC) 180 MG EC tablet Take 180 mg by mouth. Take 8 tablets daily    Historical Provider, MD  NIFEdipine (PROCARDIA-XL/ADALAT CC) 60 MG 24 hr tablet Take 60 mg by mouth 2 (two) times daily.     Historical Provider, MD  nitroGLYCERIN (NITROSTAT) 0.4 MG SL tablet Place 1 tablet (0.4 mg total) under the tongue every 5 (five) minutes as needed for chest pain. 07/26/12   Vernie Shanks, MD  omeprazole (PRILOSEC) 20 MG capsule Take 20 mg by mouth daily.    Historical Provider, MD  polyethylene glycol powder (GLYCOLAX/MIRALAX) powder  06/21/12   Historical Provider, MD  predniSONE (DELTASONE) 5 MG tablet Take 5 mg by mouth daily.    Historical Provider, MD  tacrolimus (PROGRAF) 1 MG capsule Take 5 mg by mouth daily.    Historical Provider, MD  tamsulosin (FLOMAX) 0.4 MG CAPS capsule Take 0.4 mg by mouth daily.    Historical Provider, MD  traMADol Veatrice Bourbon) 50 MG tablet  06/21/12   Historical Provider, MD  warfarin (COUMADIN) 1 MG tablet Take 1 or 2 tablets daily as directed by anticoagulation clinic 12/29/12   Tammy Eckard, PHARMD     ROS: As above in the HPI. All other systems are stable or negative.  OBJECTIVE: APPEARANCE:  Patient in no acute distress.The patient appeared well nourished and normally  developed. Acyanotic. Waist: VITAL SIGNS:BP 124/60  Pulse 74  Temp(Src) 97 F (36.1 C) (Oral)  Ht 5' 9.5" (1.765 m)  Wt  191 lb 9.6 oz (86.909 kg)  BMI 27.90 kg/m2  Pale WM.  SKIN: warm and  Dry without overt rashes, tattoos. Scar on abdomen from transplant.  HEAD and Neck: without JVD, Head and scalp: normal Eyes:No scleral icterus. Fundi normal, eye movements normal. Ears: Auricle normal, canal normal, Tympanic membranes normal, insufflation normal. Nose: normal Throat: normal Neck & thyroid: normal  CHEST & LUNGS: Chest wall: normal Lungs: Clear  CVS: Reveals the PMI to be normally located. Regular rhythm, First and Second Heart sounds are normal,  absence of murmurs, rubs or gallops. Peripheral vasculature: Radial pulses: normal Dorsal pedis pulses: normal Posterior pulses: normal  ABDOMEN:  Appearance: normal Benign, no organomegaly, no masses, no Abdominal Aortic enlargement. No Guarding , no rebound. No Bruits. Bowel sounds: normal  RECTAL: N/A GU: N/A  EXTREMETIES: nonedematous.  MUSCULOSKELETAL:  Spine: normal Joints: intact  NEUROLOGIC: oriented to time,place and person; nonfocal.  Results for orders placed in visit on 03/17/13  POCT INR      Result Value Range   INR 1.9    reviewed : labs brought patient from [facility]. HGB was 8.8 Creatinine was mildly elevated. Results scanned in.  ASSESSMENT: S/P AVR (aortic valve replacement)  Anticoagulation goal of INR 2.5 to 3.5 - Plan: POCT INR, warfarin (COUMADIN) 1 MG tablet  HLD (hyperlipidemia)  History of kidney transplant  Essential hypertension  Encounter for long-term (current) use of other medications  Coronary atherosclerosis  CKD (chronic kidney disease) stage V requiring chronic dialysis  Chronic kidney disease, stage III (moderate)  Aortic valve disorder  Low back pain  Nontoxic uninodular goiter  OSA (obstructive sleep apnea) - cpap SETTING OF 13 CM.  ETD (eustachian  tube dysfunction), right - Plan: fluticasone (FLONASE) 50 MCG/ACT nasal spray  PLAN:  Orders Placed This Encounter  Procedures  . POCT INR   Meds ordered this encounter  Medications  . fluconazole (DIFLUCAN) 50 MG tablet    Sig:   . pravastatin (PRAVACHOL) 40 MG tablet    Sig:   . fluticasone (FLONASE) 50 MCG/ACT nasal spray    Sig: Place 2 sprays into both nostrils daily.    Dispense:  16 g    Refill:  2  . warfarin (COUMADIN) 1 MG tablet    Sig: Take 3 tablets Tuesdays, Thursdays, Saturdays and Sundays. 2 tablets all the other days as directed by anticoagulation clinic    Dispense:  100 tablet    Refill:  3   Medications Discontinued During This Encounter  Medication Reason  . warfarin (COUMADIN) 1 MG tablet Reorder   Return in about 1 week (around 03/24/2013) for recheck protime, unless home Protime is being done, then 2 months.  Lauralei Clouse P. Jacelyn Grip, M.D.

## 2013-03-18 DIAGNOSIS — Z7901 Long term (current) use of anticoagulants: Secondary | ICD-10-CM | POA: Diagnosis not present

## 2013-03-18 DIAGNOSIS — Z48298 Encounter for aftercare following other organ transplant: Secondary | ICD-10-CM | POA: Diagnosis not present

## 2013-03-18 DIAGNOSIS — Z5181 Encounter for therapeutic drug level monitoring: Secondary | ICD-10-CM | POA: Diagnosis not present

## 2013-03-18 DIAGNOSIS — N189 Chronic kidney disease, unspecified: Secondary | ICD-10-CM | POA: Diagnosis not present

## 2013-03-18 DIAGNOSIS — I129 Hypertensive chronic kidney disease with stage 1 through stage 4 chronic kidney disease, or unspecified chronic kidney disease: Secondary | ICD-10-CM | POA: Diagnosis not present

## 2013-03-18 DIAGNOSIS — R5381 Other malaise: Secondary | ICD-10-CM | POA: Diagnosis not present

## 2013-03-18 DIAGNOSIS — D899 Disorder involving the immune mechanism, unspecified: Secondary | ICD-10-CM | POA: Diagnosis not present

## 2013-03-18 DIAGNOSIS — D631 Anemia in chronic kidney disease: Secondary | ICD-10-CM | POA: Diagnosis not present

## 2013-03-18 DIAGNOSIS — Z940 Kidney transplant status: Secondary | ICD-10-CM | POA: Diagnosis not present

## 2013-03-18 DIAGNOSIS — E872 Acidosis: Secondary | ICD-10-CM | POA: Diagnosis not present

## 2013-03-18 DIAGNOSIS — Z954 Presence of other heart-valve replacement: Secondary | ICD-10-CM | POA: Diagnosis not present

## 2013-04-04 DIAGNOSIS — Z940 Kidney transplant status: Secondary | ICD-10-CM | POA: Diagnosis not present

## 2013-04-04 DIAGNOSIS — D631 Anemia in chronic kidney disease: Secondary | ICD-10-CM | POA: Diagnosis not present

## 2013-04-04 DIAGNOSIS — N183 Chronic kidney disease, stage 3 unspecified: Secondary | ICD-10-CM | POA: Diagnosis not present

## 2013-04-04 DIAGNOSIS — Z09 Encounter for follow-up examination after completed treatment for conditions other than malignant neoplasm: Secondary | ICD-10-CM | POA: Diagnosis not present

## 2013-04-04 DIAGNOSIS — Z48298 Encounter for aftercare following other organ transplant: Secondary | ICD-10-CM | POA: Diagnosis not present

## 2013-04-06 ENCOUNTER — Ambulatory Visit (INDEPENDENT_AMBULATORY_CARE_PROVIDER_SITE_OTHER): Payer: Medicare Other | Admitting: Pharmacist

## 2013-04-06 DIAGNOSIS — Z952 Presence of prosthetic heart valve: Secondary | ICD-10-CM

## 2013-04-06 DIAGNOSIS — Z954 Presence of other heart-valve replacement: Secondary | ICD-10-CM

## 2013-04-06 DIAGNOSIS — Z5181 Encounter for therapeutic drug level monitoring: Secondary | ICD-10-CM

## 2013-04-06 DIAGNOSIS — Z7901 Long term (current) use of anticoagulants: Secondary | ICD-10-CM

## 2013-04-06 LAB — POCT INR: INR: 2.1

## 2013-04-06 NOTE — Progress Notes (Signed)
INR results faxed from home INR monitoring [place]

## 2013-04-11 DIAGNOSIS — I129 Hypertensive chronic kidney disease with stage 1 through stage 4 chronic kidney disease, or unspecified chronic kidney disease: Secondary | ICD-10-CM | POA: Diagnosis not present

## 2013-04-11 DIAGNOSIS — Z48298 Encounter for aftercare following other organ transplant: Secondary | ICD-10-CM | POA: Diagnosis not present

## 2013-04-11 DIAGNOSIS — N189 Chronic kidney disease, unspecified: Secondary | ICD-10-CM | POA: Diagnosis not present

## 2013-04-11 DIAGNOSIS — Z9861 Coronary angioplasty status: Secondary | ICD-10-CM | POA: Diagnosis not present

## 2013-04-11 DIAGNOSIS — D899 Disorder involving the immune mechanism, unspecified: Secondary | ICD-10-CM | POA: Diagnosis not present

## 2013-04-11 DIAGNOSIS — E872 Acidosis, unspecified: Secondary | ICD-10-CM | POA: Diagnosis not present

## 2013-04-11 DIAGNOSIS — M461 Sacroiliitis, not elsewhere classified: Secondary | ICD-10-CM | POA: Diagnosis not present

## 2013-04-11 DIAGNOSIS — R259 Unspecified abnormal involuntary movements: Secondary | ICD-10-CM | POA: Diagnosis not present

## 2013-04-11 DIAGNOSIS — E785 Hyperlipidemia, unspecified: Secondary | ICD-10-CM | POA: Diagnosis not present

## 2013-04-11 DIAGNOSIS — E041 Nontoxic single thyroid nodule: Secondary | ICD-10-CM | POA: Diagnosis not present

## 2013-04-11 DIAGNOSIS — I2510 Atherosclerotic heart disease of native coronary artery without angina pectoris: Secondary | ICD-10-CM | POA: Diagnosis not present

## 2013-04-11 DIAGNOSIS — IMO0002 Reserved for concepts with insufficient information to code with codable children: Secondary | ICD-10-CM | POA: Diagnosis not present

## 2013-04-11 DIAGNOSIS — I509 Heart failure, unspecified: Secondary | ICD-10-CM | POA: Diagnosis not present

## 2013-04-11 DIAGNOSIS — Z7901 Long term (current) use of anticoagulants: Secondary | ICD-10-CM | POA: Diagnosis not present

## 2013-04-11 DIAGNOSIS — C649 Malignant neoplasm of unspecified kidney, except renal pelvis: Secondary | ICD-10-CM | POA: Diagnosis not present

## 2013-04-11 DIAGNOSIS — Z940 Kidney transplant status: Secondary | ICD-10-CM | POA: Diagnosis not present

## 2013-04-11 DIAGNOSIS — I200 Unstable angina: Secondary | ICD-10-CM | POA: Diagnosis not present

## 2013-04-11 DIAGNOSIS — Z79899 Other long term (current) drug therapy: Secondary | ICD-10-CM | POA: Diagnosis not present

## 2013-04-11 DIAGNOSIS — N183 Chronic kidney disease, stage 3 unspecified: Secondary | ICD-10-CM | POA: Diagnosis not present

## 2013-04-13 DIAGNOSIS — Z954 Presence of other heart-valve replacement: Secondary | ICD-10-CM | POA: Diagnosis not present

## 2013-04-13 LAB — POCT INR: INR: 2.6

## 2013-04-14 ENCOUNTER — Ambulatory Visit (INDEPENDENT_AMBULATORY_CARE_PROVIDER_SITE_OTHER): Payer: Medicare Other | Admitting: Pharmacist

## 2013-04-14 DIAGNOSIS — Z7901 Long term (current) use of anticoagulants: Secondary | ICD-10-CM

## 2013-04-14 DIAGNOSIS — Z5181 Encounter for therapeutic drug level monitoring: Secondary | ICD-10-CM

## 2013-04-14 DIAGNOSIS — I359 Nonrheumatic aortic valve disorder, unspecified: Secondary | ICD-10-CM | POA: Diagnosis not present

## 2013-04-14 DIAGNOSIS — Z954 Presence of other heart-valve replacement: Secondary | ICD-10-CM | POA: Diagnosis not present

## 2013-04-14 DIAGNOSIS — Z952 Presence of prosthetic heart valve: Secondary | ICD-10-CM

## 2013-04-14 NOTE — Progress Notes (Signed)
Patient checks INR at home with Home INR monitoring.  Billing once per month interupertation fee.  Patient diagnosis - aortic valve replacement Procedure code if [id number]

## 2013-04-19 DIAGNOSIS — Z1289 Encounter for screening for malignant neoplasm of other sites: Secondary | ICD-10-CM | POA: Diagnosis not present

## 2013-04-19 DIAGNOSIS — Z0389 Encounter for observation for other suspected diseases and conditions ruled out: Secondary | ICD-10-CM | POA: Diagnosis not present

## 2013-04-19 DIAGNOSIS — N1330 Unspecified hydronephrosis: Secondary | ICD-10-CM | POA: Diagnosis not present

## 2013-04-19 DIAGNOSIS — N2889 Other specified disorders of kidney and ureter: Secondary | ICD-10-CM | POA: Diagnosis not present

## 2013-04-19 DIAGNOSIS — Z940 Kidney transplant status: Secondary | ICD-10-CM | POA: Diagnosis not present

## 2013-04-19 DIAGNOSIS — C649 Malignant neoplasm of unspecified kidney, except renal pelvis: Secondary | ICD-10-CM | POA: Diagnosis not present

## 2013-04-20 DIAGNOSIS — Z9861 Coronary angioplasty status: Secondary | ICD-10-CM | POA: Diagnosis not present

## 2013-04-20 DIAGNOSIS — N1330 Unspecified hydronephrosis: Secondary | ICD-10-CM | POA: Diagnosis not present

## 2013-04-20 DIAGNOSIS — D649 Anemia, unspecified: Secondary | ICD-10-CM | POA: Diagnosis not present

## 2013-04-20 DIAGNOSIS — T8610 Unspecified complication of kidney transplant: Secondary | ICD-10-CM | POA: Diagnosis not present

## 2013-04-20 DIAGNOSIS — E872 Acidosis, unspecified: Secondary | ICD-10-CM | POA: Diagnosis not present

## 2013-04-20 DIAGNOSIS — I10 Essential (primary) hypertension: Secondary | ICD-10-CM | POA: Diagnosis not present

## 2013-04-20 DIAGNOSIS — Z23 Encounter for immunization: Secondary | ICD-10-CM | POA: Diagnosis not present

## 2013-04-20 DIAGNOSIS — Z940 Kidney transplant status: Secondary | ICD-10-CM | POA: Diagnosis not present

## 2013-04-20 DIAGNOSIS — N179 Acute kidney failure, unspecified: Secondary | ICD-10-CM | POA: Diagnosis not present

## 2013-04-20 DIAGNOSIS — E041 Nontoxic single thyroid nodule: Secondary | ICD-10-CM | POA: Diagnosis not present

## 2013-04-20 DIAGNOSIS — D899 Disorder involving the immune mechanism, unspecified: Secondary | ICD-10-CM | POA: Diagnosis not present

## 2013-04-20 DIAGNOSIS — R9389 Abnormal findings on diagnostic imaging of other specified body structures: Secondary | ICD-10-CM | POA: Diagnosis not present

## 2013-04-20 DIAGNOSIS — I2510 Atherosclerotic heart disease of native coronary artery without angina pectoris: Secondary | ICD-10-CM | POA: Diagnosis not present

## 2013-04-21 ENCOUNTER — Ambulatory Visit (INDEPENDENT_AMBULATORY_CARE_PROVIDER_SITE_OTHER): Payer: Medicare Other | Admitting: Pharmacist

## 2013-04-21 DIAGNOSIS — Z954 Presence of other heart-valve replacement: Secondary | ICD-10-CM

## 2013-04-21 DIAGNOSIS — Z7901 Long term (current) use of anticoagulants: Secondary | ICD-10-CM

## 2013-04-21 DIAGNOSIS — Z5181 Encounter for therapeutic drug level monitoring: Secondary | ICD-10-CM

## 2013-04-21 DIAGNOSIS — Z952 Presence of prosthetic heart valve: Secondary | ICD-10-CM

## 2013-04-22 LAB — POCT INR: INR: 2.7

## 2013-04-26 DIAGNOSIS — Z48298 Encounter for aftercare following other organ transplant: Secondary | ICD-10-CM | POA: Diagnosis not present

## 2013-04-26 DIAGNOSIS — Z940 Kidney transplant status: Secondary | ICD-10-CM | POA: Diagnosis not present

## 2013-04-26 DIAGNOSIS — Z7901 Long term (current) use of anticoagulants: Secondary | ICD-10-CM | POA: Diagnosis not present

## 2013-04-26 DIAGNOSIS — I509 Heart failure, unspecified: Secondary | ICD-10-CM | POA: Diagnosis not present

## 2013-04-28 ENCOUNTER — Ambulatory Visit (INDEPENDENT_AMBULATORY_CARE_PROVIDER_SITE_OTHER): Payer: Medicare Other | Admitting: Pharmacist

## 2013-04-28 DIAGNOSIS — Z5181 Encounter for therapeutic drug level monitoring: Secondary | ICD-10-CM

## 2013-04-28 DIAGNOSIS — Z7901 Long term (current) use of anticoagulants: Secondary | ICD-10-CM

## 2013-04-28 DIAGNOSIS — Z952 Presence of prosthetic heart valve: Secondary | ICD-10-CM

## 2013-04-28 DIAGNOSIS — Z954 Presence of other heart-valve replacement: Secondary | ICD-10-CM

## 2013-04-28 LAB — POCT INR: INR: 2.5

## 2013-04-28 NOTE — Progress Notes (Signed)
No charge for labs or visit - INR check through home monitoring system  

## 2013-05-09 DIAGNOSIS — E872 Acidosis, unspecified: Secondary | ICD-10-CM | POA: Diagnosis not present

## 2013-05-09 DIAGNOSIS — I120 Hypertensive chronic kidney disease with stage 5 chronic kidney disease or end stage renal disease: Secondary | ICD-10-CM | POA: Diagnosis not present

## 2013-05-09 DIAGNOSIS — I359 Nonrheumatic aortic valve disorder, unspecified: Secondary | ICD-10-CM | POA: Diagnosis not present

## 2013-05-09 DIAGNOSIS — R609 Edema, unspecified: Secondary | ICD-10-CM | POA: Diagnosis not present

## 2013-05-09 DIAGNOSIS — N059 Unspecified nephritic syndrome with unspecified morphologic changes: Secondary | ICD-10-CM | POA: Diagnosis not present

## 2013-05-09 DIAGNOSIS — I129 Hypertensive chronic kidney disease with stage 1 through stage 4 chronic kidney disease, or unspecified chronic kidney disease: Secondary | ICD-10-CM | POA: Diagnosis not present

## 2013-05-09 DIAGNOSIS — N183 Chronic kidney disease, stage 3 unspecified: Secondary | ICD-10-CM | POA: Diagnosis not present

## 2013-05-09 DIAGNOSIS — Z48298 Encounter for aftercare following other organ transplant: Secondary | ICD-10-CM | POA: Diagnosis not present

## 2013-05-09 DIAGNOSIS — I200 Unstable angina: Secondary | ICD-10-CM | POA: Diagnosis not present

## 2013-05-09 DIAGNOSIS — D899 Disorder involving the immune mechanism, unspecified: Secondary | ICD-10-CM | POA: Diagnosis not present

## 2013-05-09 DIAGNOSIS — D649 Anemia, unspecified: Secondary | ICD-10-CM | POA: Diagnosis not present

## 2013-05-09 DIAGNOSIS — Z940 Kidney transplant status: Secondary | ICD-10-CM | POA: Diagnosis not present

## 2013-05-09 DIAGNOSIS — N186 End stage renal disease: Secondary | ICD-10-CM | POA: Diagnosis not present

## 2013-05-09 DIAGNOSIS — M25519 Pain in unspecified shoulder: Secondary | ICD-10-CM | POA: Diagnosis not present

## 2013-05-11 ENCOUNTER — Encounter: Payer: Self-pay | Admitting: Family Medicine

## 2013-05-11 ENCOUNTER — Ambulatory Visit (INDEPENDENT_AMBULATORY_CARE_PROVIDER_SITE_OTHER): Payer: Medicare Other | Admitting: Family Medicine

## 2013-05-11 VITALS — BP 140/59 | HR 98 | Temp 97.6°F | Wt 200.8 lb

## 2013-05-11 DIAGNOSIS — M542 Cervicalgia: Secondary | ICD-10-CM

## 2013-05-11 MED ORDER — PREDNISONE 10 MG PO TABS: ORAL_TABLET | ORAL | Status: DC

## 2013-05-11 NOTE — Progress Notes (Signed)
   Subjective:    Patient ID: BUSH BUEHRING, male    DOB: 05-15-1955, 58 y.o.   MRN: [id number]  HPI  This 58 y.o. male presents for evaluation of right neck, shoulder, and arm discomfort.  He Has had this for a week now and he is experiencing decreased strength in his right hand.  Review of Systems No chest pain, SOB, HA, dizziness, vision change, N/V, diarrhea, constipation, dysuria, urinary urgency or frequency, myalgias, arthralgias or rash.     Objective:   Physical Exam Vital signs noted  Well developed well nourished male.  HEENT - Head atraumatic Normocephalic                Eyes - PERRLA, Conjuctiva - clear Sclera- Clear EOMI                Ears - EAC's Wnl TM's Wnl Gross Hearing WNL                Nose - Nares patent                 Throat - oropharanx wnl Respiratory - Lungs CTA bilateral Cardiac - RRR S1 and S2 without murmur MS - TTP cervical paraspinous muscles and decreased strength right hand and decreased right          Grip.      Assessment & Plan:  Cervicalgia - Plan: predniSONE (DELTASONE) 10 MG tablet and continue hydrocodone analgesic And follow up if not better.  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  Lysbeth Penner FNP

## 2013-05-12 DIAGNOSIS — Z954 Presence of other heart-valve replacement: Secondary | ICD-10-CM | POA: Diagnosis not present

## 2013-05-18 DIAGNOSIS — I10 Essential (primary) hypertension: Secondary | ICD-10-CM | POA: Diagnosis not present

## 2013-05-18 DIAGNOSIS — Z954 Presence of other heart-valve replacement: Secondary | ICD-10-CM | POA: Diagnosis not present

## 2013-05-18 DIAGNOSIS — R609 Edema, unspecified: Secondary | ICD-10-CM | POA: Diagnosis not present

## 2013-05-18 DIAGNOSIS — Z48298 Encounter for aftercare following other organ transplant: Secondary | ICD-10-CM | POA: Diagnosis not present

## 2013-05-18 DIAGNOSIS — Z940 Kidney transplant status: Secondary | ICD-10-CM | POA: Diagnosis not present

## 2013-05-18 DIAGNOSIS — D899 Disorder involving the immune mechanism, unspecified: Secondary | ICD-10-CM | POA: Diagnosis not present

## 2013-05-18 DIAGNOSIS — N179 Acute kidney failure, unspecified: Secondary | ICD-10-CM | POA: Diagnosis not present

## 2013-05-18 DIAGNOSIS — D649 Anemia, unspecified: Secondary | ICD-10-CM | POA: Diagnosis not present

## 2013-05-20 ENCOUNTER — Telehealth: Payer: Self-pay | Admitting: *Deleted

## 2013-05-20 LAB — POCT INR: INR: 3.9

## 2013-05-20 NOTE — Telephone Encounter (Signed)
The higher Protime is most lekely due to the recent prednisone and hydrocodone use. Hold the next dose of coumadin.then continue on the regular schedule and recheck the protime on mOnday.

## 2013-05-20 NOTE — Telephone Encounter (Signed)
Pt notified and will reck protime Monday feb16 2015

## 2013-05-20 NOTE — Telephone Encounter (Signed)
Inr today is 3.9

## 2013-05-23 ENCOUNTER — Ambulatory Visit (INDEPENDENT_AMBULATORY_CARE_PROVIDER_SITE_OTHER): Payer: Medicare Other | Admitting: Pharmacist

## 2013-05-23 ENCOUNTER — Ambulatory Visit: Payer: Medicare Other | Admitting: Pharmacist

## 2013-05-23 DIAGNOSIS — D631 Anemia in chronic kidney disease: Secondary | ICD-10-CM | POA: Diagnosis not present

## 2013-05-23 DIAGNOSIS — D649 Anemia, unspecified: Secondary | ICD-10-CM | POA: Diagnosis not present

## 2013-05-23 DIAGNOSIS — Z952 Presence of prosthetic heart valve: Secondary | ICD-10-CM

## 2013-05-23 DIAGNOSIS — Z5181 Encounter for therapeutic drug level monitoring: Secondary | ICD-10-CM

## 2013-05-23 DIAGNOSIS — I129 Hypertensive chronic kidney disease with stage 1 through stage 4 chronic kidney disease, or unspecified chronic kidney disease: Secondary | ICD-10-CM | POA: Diagnosis not present

## 2013-05-23 DIAGNOSIS — N183 Chronic kidney disease, stage 3 unspecified: Secondary | ICD-10-CM | POA: Diagnosis not present

## 2013-05-23 DIAGNOSIS — Z954 Presence of other heart-valve replacement: Secondary | ICD-10-CM | POA: Diagnosis not present

## 2013-05-23 DIAGNOSIS — D61818 Other pancytopenia: Secondary | ICD-10-CM | POA: Diagnosis not present

## 2013-05-23 DIAGNOSIS — I359 Nonrheumatic aortic valve disorder, unspecified: Secondary | ICD-10-CM

## 2013-05-23 DIAGNOSIS — I2510 Atherosclerotic heart disease of native coronary artery without angina pectoris: Secondary | ICD-10-CM | POA: Diagnosis not present

## 2013-05-23 DIAGNOSIS — Z7901 Long term (current) use of anticoagulants: Secondary | ICD-10-CM

## 2013-05-23 DIAGNOSIS — Z940 Kidney transplant status: Secondary | ICD-10-CM | POA: Diagnosis not present

## 2013-05-23 DIAGNOSIS — Z48298 Encounter for aftercare following other organ transplant: Secondary | ICD-10-CM | POA: Diagnosis not present

## 2013-05-23 LAB — POCT INR: INR: 2

## 2013-05-23 NOTE — Progress Notes (Signed)
INR results faxed from home INR monitoring company [place]. Patient checks INR at home with Home INR monitoring.  Billing once per month interupertation fee.  Patient diagnosis - AVR replacement Procedure code if [id number]

## 2013-05-25 DIAGNOSIS — Z79899 Other long term (current) drug therapy: Secondary | ICD-10-CM | POA: Diagnosis not present

## 2013-05-25 DIAGNOSIS — I209 Angina pectoris, unspecified: Secondary | ICD-10-CM | POA: Diagnosis not present

## 2013-05-25 DIAGNOSIS — E872 Acidosis, unspecified: Secondary | ICD-10-CM | POA: Diagnosis not present

## 2013-05-25 DIAGNOSIS — D649 Anemia, unspecified: Secondary | ICD-10-CM | POA: Diagnosis not present

## 2013-05-25 DIAGNOSIS — I10 Essential (primary) hypertension: Secondary | ICD-10-CM | POA: Diagnosis not present

## 2013-05-25 DIAGNOSIS — M461 Sacroiliitis, not elsewhere classified: Secondary | ICD-10-CM | POA: Diagnosis not present

## 2013-05-25 DIAGNOSIS — I359 Nonrheumatic aortic valve disorder, unspecified: Secondary | ICD-10-CM | POA: Diagnosis not present

## 2013-05-25 DIAGNOSIS — N183 Chronic kidney disease, stage 3 unspecified: Secondary | ICD-10-CM | POA: Diagnosis not present

## 2013-05-25 DIAGNOSIS — I2510 Atherosclerotic heart disease of native coronary artery without angina pectoris: Secondary | ICD-10-CM | POA: Diagnosis not present

## 2013-05-25 DIAGNOSIS — I129 Hypertensive chronic kidney disease with stage 1 through stage 4 chronic kidney disease, or unspecified chronic kidney disease: Secondary | ICD-10-CM | POA: Diagnosis not present

## 2013-05-25 DIAGNOSIS — Z48298 Encounter for aftercare following other organ transplant: Secondary | ICD-10-CM | POA: Diagnosis not present

## 2013-05-25 DIAGNOSIS — Z940 Kidney transplant status: Secondary | ICD-10-CM | POA: Diagnosis not present

## 2013-05-25 DIAGNOSIS — N186 End stage renal disease: Secondary | ICD-10-CM | POA: Diagnosis not present

## 2013-05-25 DIAGNOSIS — D899 Disorder involving the immune mechanism, unspecified: Secondary | ICD-10-CM | POA: Diagnosis not present

## 2013-05-25 DIAGNOSIS — R259 Unspecified abnormal involuntary movements: Secondary | ICD-10-CM | POA: Diagnosis not present

## 2013-05-25 DIAGNOSIS — Z954 Presence of other heart-valve replacement: Secondary | ICD-10-CM | POA: Diagnosis not present

## 2013-05-25 DIAGNOSIS — M25519 Pain in unspecified shoulder: Secondary | ICD-10-CM | POA: Diagnosis not present

## 2013-05-25 DIAGNOSIS — R609 Edema, unspecified: Secondary | ICD-10-CM | POA: Diagnosis not present

## 2013-06-07 LAB — POCT INR: INR: 3.8

## 2013-06-08 ENCOUNTER — Ambulatory Visit (INDEPENDENT_AMBULATORY_CARE_PROVIDER_SITE_OTHER): Payer: Medicare Other | Admitting: Pharmacist

## 2013-06-08 DIAGNOSIS — Z954 Presence of other heart-valve replacement: Secondary | ICD-10-CM

## 2013-06-08 DIAGNOSIS — Z7901 Long term (current) use of anticoagulants: Secondary | ICD-10-CM | POA: Diagnosis not present

## 2013-06-08 DIAGNOSIS — I10 Essential (primary) hypertension: Secondary | ICD-10-CM | POA: Diagnosis not present

## 2013-06-08 DIAGNOSIS — D899 Disorder involving the immune mechanism, unspecified: Secondary | ICD-10-CM | POA: Diagnosis not present

## 2013-06-08 DIAGNOSIS — D649 Anemia, unspecified: Secondary | ICD-10-CM | POA: Diagnosis not present

## 2013-06-08 DIAGNOSIS — Z940 Kidney transplant status: Secondary | ICD-10-CM | POA: Diagnosis not present

## 2013-06-08 DIAGNOSIS — Z5181 Encounter for therapeutic drug level monitoring: Secondary | ICD-10-CM | POA: Diagnosis not present

## 2013-06-08 DIAGNOSIS — Z9861 Coronary angioplasty status: Secondary | ICD-10-CM | POA: Diagnosis not present

## 2013-06-08 DIAGNOSIS — R5381 Other malaise: Secondary | ICD-10-CM | POA: Diagnosis not present

## 2013-06-08 DIAGNOSIS — Z9886 Personal history of breast implant removal: Secondary | ICD-10-CM | POA: Diagnosis not present

## 2013-06-08 DIAGNOSIS — Z952 Presence of prosthetic heart valve: Secondary | ICD-10-CM

## 2013-06-08 DIAGNOSIS — Z48298 Encounter for aftercare following other organ transplant: Secondary | ICD-10-CM | POA: Diagnosis not present

## 2013-06-08 DIAGNOSIS — R5383 Other fatigue: Secondary | ICD-10-CM | POA: Diagnosis not present

## 2013-06-08 NOTE — Progress Notes (Signed)
Patient checks INR at home with Home INR monitoring.  Billing once per month interupertation fee.  Patient diagnosis - aortic mechanical heart valve  Procedure code if [id number]

## 2013-06-13 DIAGNOSIS — Z954 Presence of other heart-valve replacement: Secondary | ICD-10-CM | POA: Diagnosis not present

## 2013-06-13 DIAGNOSIS — Z7982 Long term (current) use of aspirin: Secondary | ICD-10-CM | POA: Diagnosis not present

## 2013-06-13 DIAGNOSIS — I2510 Atherosclerotic heart disease of native coronary artery without angina pectoris: Secondary | ICD-10-CM | POA: Diagnosis not present

## 2013-06-13 DIAGNOSIS — D649 Anemia, unspecified: Secondary | ICD-10-CM | POA: Diagnosis not present

## 2013-06-13 DIAGNOSIS — Z9861 Coronary angioplasty status: Secondary | ICD-10-CM | POA: Diagnosis not present

## 2013-06-13 DIAGNOSIS — Z940 Kidney transplant status: Secondary | ICD-10-CM | POA: Diagnosis not present

## 2013-06-13 DIAGNOSIS — Z792 Long term (current) use of antibiotics: Secondary | ICD-10-CM | POA: Diagnosis not present

## 2013-06-13 DIAGNOSIS — Z888 Allergy status to other drugs, medicaments and biological substances status: Secondary | ICD-10-CM | POA: Diagnosis not present

## 2013-06-13 DIAGNOSIS — Z7901 Long term (current) use of anticoagulants: Secondary | ICD-10-CM | POA: Diagnosis not present

## 2013-06-13 DIAGNOSIS — I10 Essential (primary) hypertension: Secondary | ICD-10-CM | POA: Diagnosis not present

## 2013-06-13 DIAGNOSIS — IMO0002 Reserved for concepts with insufficient information to code with codable children: Secondary | ICD-10-CM | POA: Diagnosis not present

## 2013-06-13 DIAGNOSIS — E785 Hyperlipidemia, unspecified: Secondary | ICD-10-CM | POA: Diagnosis not present

## 2013-06-13 DIAGNOSIS — I5020 Unspecified systolic (congestive) heart failure: Secondary | ICD-10-CM | POA: Diagnosis not present

## 2013-06-13 DIAGNOSIS — I509 Heart failure, unspecified: Secondary | ICD-10-CM | POA: Diagnosis not present

## 2013-06-13 DIAGNOSIS — Z85528 Personal history of other malignant neoplasm of kidney: Secondary | ICD-10-CM | POA: Diagnosis not present

## 2013-06-13 LAB — POCT INR: INR: 2.3

## 2013-06-15 ENCOUNTER — Ambulatory Visit (INDEPENDENT_AMBULATORY_CARE_PROVIDER_SITE_OTHER): Payer: Medicare Other | Admitting: Pharmacist

## 2013-06-15 DIAGNOSIS — M25519 Pain in unspecified shoulder: Secondary | ICD-10-CM | POA: Diagnosis not present

## 2013-06-15 DIAGNOSIS — Z952 Presence of prosthetic heart valve: Secondary | ICD-10-CM

## 2013-06-15 DIAGNOSIS — M5104 Intervertebral disc disorders with myelopathy, thoracic region: Secondary | ICD-10-CM | POA: Diagnosis not present

## 2013-06-15 DIAGNOSIS — IMO0002 Reserved for concepts with insufficient information to code with codable children: Secondary | ICD-10-CM | POA: Diagnosis not present

## 2013-06-15 DIAGNOSIS — Z7901 Long term (current) use of anticoagulants: Secondary | ICD-10-CM

## 2013-06-15 DIAGNOSIS — Z954 Presence of other heart-valve replacement: Secondary | ICD-10-CM

## 2013-06-15 DIAGNOSIS — M4802 Spinal stenosis, cervical region: Secondary | ICD-10-CM | POA: Diagnosis not present

## 2013-06-15 DIAGNOSIS — M5124 Other intervertebral disc displacement, thoracic region: Secondary | ICD-10-CM | POA: Diagnosis not present

## 2013-06-15 DIAGNOSIS — Z5181 Encounter for therapeutic drug level monitoring: Secondary | ICD-10-CM

## 2013-06-15 DIAGNOSIS — M47812 Spondylosis without myelopathy or radiculopathy, cervical region: Secondary | ICD-10-CM | POA: Diagnosis not present

## 2013-06-15 NOTE — Progress Notes (Signed)
No charge for labs or visit - INR check through home monitoring system  

## 2013-06-20 DIAGNOSIS — Z5189 Encounter for other specified aftercare: Secondary | ICD-10-CM | POA: Diagnosis not present

## 2013-06-20 DIAGNOSIS — R062 Wheezing: Secondary | ICD-10-CM | POA: Diagnosis not present

## 2013-06-20 DIAGNOSIS — Z9861 Coronary angioplasty status: Secondary | ICD-10-CM | POA: Diagnosis not present

## 2013-06-20 DIAGNOSIS — Z48298 Encounter for aftercare following other organ transplant: Secondary | ICD-10-CM | POA: Diagnosis not present

## 2013-06-20 DIAGNOSIS — Z7901 Long term (current) use of anticoagulants: Secondary | ICD-10-CM | POA: Diagnosis not present

## 2013-06-20 DIAGNOSIS — M25519 Pain in unspecified shoulder: Secondary | ICD-10-CM | POA: Diagnosis not present

## 2013-06-20 DIAGNOSIS — C649 Malignant neoplasm of unspecified kidney, except renal pelvis: Secondary | ICD-10-CM | POA: Diagnosis not present

## 2013-06-20 DIAGNOSIS — R059 Cough, unspecified: Secondary | ICD-10-CM | POA: Diagnosis not present

## 2013-06-20 DIAGNOSIS — J069 Acute upper respiratory infection, unspecified: Secondary | ICD-10-CM | POA: Diagnosis not present

## 2013-06-20 DIAGNOSIS — IMO0002 Reserved for concepts with insufficient information to code with codable children: Secondary | ICD-10-CM | POA: Diagnosis not present

## 2013-06-20 DIAGNOSIS — M4800 Spinal stenosis, site unspecified: Secondary | ICD-10-CM | POA: Insufficient documentation

## 2013-06-20 DIAGNOSIS — I2510 Atherosclerotic heart disease of native coronary artery without angina pectoris: Secondary | ICD-10-CM | POA: Diagnosis not present

## 2013-06-20 DIAGNOSIS — D899 Disorder involving the immune mechanism, unspecified: Secondary | ICD-10-CM | POA: Diagnosis not present

## 2013-06-20 DIAGNOSIS — E872 Acidosis, unspecified: Secondary | ICD-10-CM | POA: Diagnosis not present

## 2013-06-20 DIAGNOSIS — I359 Nonrheumatic aortic valve disorder, unspecified: Secondary | ICD-10-CM | POA: Diagnosis not present

## 2013-06-20 DIAGNOSIS — E785 Hyperlipidemia, unspecified: Secondary | ICD-10-CM | POA: Diagnosis not present

## 2013-06-20 DIAGNOSIS — Z7982 Long term (current) use of aspirin: Secondary | ICD-10-CM | POA: Diagnosis not present

## 2013-06-20 DIAGNOSIS — D649 Anemia, unspecified: Secondary | ICD-10-CM | POA: Diagnosis not present

## 2013-06-20 DIAGNOSIS — Z940 Kidney transplant status: Secondary | ICD-10-CM | POA: Diagnosis not present

## 2013-06-20 DIAGNOSIS — R05 Cough: Secondary | ICD-10-CM | POA: Diagnosis not present

## 2013-06-20 DIAGNOSIS — R259 Unspecified abnormal involuntary movements: Secondary | ICD-10-CM | POA: Diagnosis not present

## 2013-06-20 DIAGNOSIS — N183 Chronic kidney disease, stage 3 unspecified: Secondary | ICD-10-CM | POA: Diagnosis not present

## 2013-06-20 DIAGNOSIS — R29898 Other symptoms and signs involving the musculoskeletal system: Secondary | ICD-10-CM | POA: Diagnosis not present

## 2013-06-20 DIAGNOSIS — I129 Hypertensive chronic kidney disease with stage 1 through stage 4 chronic kidney disease, or unspecified chronic kidney disease: Secondary | ICD-10-CM | POA: Diagnosis not present

## 2013-06-20 DIAGNOSIS — Z5181 Encounter for therapeutic drug level monitoring: Secondary | ICD-10-CM | POA: Diagnosis not present

## 2013-06-20 DIAGNOSIS — Z954 Presence of other heart-valve replacement: Secondary | ICD-10-CM | POA: Diagnosis not present

## 2013-06-20 DIAGNOSIS — R609 Edema, unspecified: Secondary | ICD-10-CM | POA: Diagnosis not present

## 2013-06-21 LAB — POCT INR: INR: 2

## 2013-06-22 ENCOUNTER — Ambulatory Visit (INDEPENDENT_AMBULATORY_CARE_PROVIDER_SITE_OTHER): Payer: Medicare Other | Admitting: Pharmacist

## 2013-06-22 DIAGNOSIS — Z952 Presence of prosthetic heart valve: Secondary | ICD-10-CM

## 2013-06-22 DIAGNOSIS — Z954 Presence of other heart-valve replacement: Secondary | ICD-10-CM

## 2013-06-22 DIAGNOSIS — Z5181 Encounter for therapeutic drug level monitoring: Secondary | ICD-10-CM

## 2013-06-22 DIAGNOSIS — Z7901 Long term (current) use of anticoagulants: Secondary | ICD-10-CM

## 2013-06-22 NOTE — Progress Notes (Signed)
No charge for labs or visit - INR check through home monitoring system  

## 2013-06-27 DIAGNOSIS — J329 Chronic sinusitis, unspecified: Secondary | ICD-10-CM | POA: Diagnosis not present

## 2013-06-27 DIAGNOSIS — Z940 Kidney transplant status: Secondary | ICD-10-CM | POA: Diagnosis not present

## 2013-06-27 DIAGNOSIS — R5383 Other fatigue: Secondary | ICD-10-CM | POA: Diagnosis not present

## 2013-06-27 DIAGNOSIS — I129 Hypertensive chronic kidney disease with stage 1 through stage 4 chronic kidney disease, or unspecified chronic kidney disease: Secondary | ICD-10-CM | POA: Diagnosis not present

## 2013-06-27 DIAGNOSIS — Z48298 Encounter for aftercare following other organ transplant: Secondary | ICD-10-CM | POA: Diagnosis not present

## 2013-06-27 DIAGNOSIS — D899 Disorder involving the immune mechanism, unspecified: Secondary | ICD-10-CM | POA: Diagnosis not present

## 2013-06-27 DIAGNOSIS — N183 Chronic kidney disease, stage 3 unspecified: Secondary | ICD-10-CM | POA: Diagnosis not present

## 2013-06-27 DIAGNOSIS — R5381 Other malaise: Secondary | ICD-10-CM | POA: Diagnosis not present

## 2013-06-28 ENCOUNTER — Telehealth: Payer: Self-pay | Admitting: Pharmacist Clinician (PhC)/ Clinical Pharmacy Specialist

## 2013-06-28 NOTE — Telephone Encounter (Signed)
Instructed patient to take 3 tablets today then resume regular schedule tomorrow.  He did eat some greens the day before.  Re-check in 1 week.

## 2013-06-30 DIAGNOSIS — E041 Nontoxic single thyroid nodule: Secondary | ICD-10-CM | POA: Diagnosis not present

## 2013-07-04 DIAGNOSIS — R109 Unspecified abdominal pain: Secondary | ICD-10-CM | POA: Diagnosis not present

## 2013-07-04 DIAGNOSIS — D509 Iron deficiency anemia, unspecified: Secondary | ICD-10-CM | POA: Diagnosis not present

## 2013-07-04 DIAGNOSIS — R197 Diarrhea, unspecified: Secondary | ICD-10-CM | POA: Diagnosis not present

## 2013-07-04 LAB — POCT INR: INR: 4.6

## 2013-07-05 DIAGNOSIS — Z954 Presence of other heart-valve replacement: Secondary | ICD-10-CM | POA: Diagnosis not present

## 2013-07-05 DIAGNOSIS — R109 Unspecified abdominal pain: Secondary | ICD-10-CM | POA: Diagnosis not present

## 2013-07-05 DIAGNOSIS — R0989 Other specified symptoms and signs involving the circulatory and respiratory systems: Secondary | ICD-10-CM | POA: Diagnosis not present

## 2013-07-05 DIAGNOSIS — R059 Cough, unspecified: Secondary | ICD-10-CM | POA: Diagnosis not present

## 2013-07-05 DIAGNOSIS — R5381 Other malaise: Secondary | ICD-10-CM | POA: Diagnosis not present

## 2013-07-05 DIAGNOSIS — R05 Cough: Secondary | ICD-10-CM | POA: Diagnosis not present

## 2013-07-05 DIAGNOSIS — Z7901 Long term (current) use of anticoagulants: Secondary | ICD-10-CM | POA: Diagnosis not present

## 2013-07-05 DIAGNOSIS — Z48298 Encounter for aftercare following other organ transplant: Secondary | ICD-10-CM | POA: Diagnosis not present

## 2013-07-05 DIAGNOSIS — Z5181 Encounter for therapeutic drug level monitoring: Secondary | ICD-10-CM | POA: Diagnosis not present

## 2013-07-05 DIAGNOSIS — R51 Headache: Secondary | ICD-10-CM | POA: Diagnosis not present

## 2013-07-05 DIAGNOSIS — D649 Anemia, unspecified: Secondary | ICD-10-CM | POA: Diagnosis not present

## 2013-07-05 DIAGNOSIS — I10 Essential (primary) hypertension: Secondary | ICD-10-CM | POA: Diagnosis not present

## 2013-07-05 DIAGNOSIS — D899 Disorder involving the immune mechanism, unspecified: Secondary | ICD-10-CM | POA: Diagnosis not present

## 2013-07-05 DIAGNOSIS — Z940 Kidney transplant status: Secondary | ICD-10-CM | POA: Diagnosis not present

## 2013-07-06 ENCOUNTER — Ambulatory Visit (INDEPENDENT_AMBULATORY_CARE_PROVIDER_SITE_OTHER): Payer: Medicare Other | Admitting: Pharmacist

## 2013-07-06 DIAGNOSIS — Z5181 Encounter for therapeutic drug level monitoring: Secondary | ICD-10-CM | POA: Diagnosis not present

## 2013-07-06 DIAGNOSIS — Z7901 Long term (current) use of anticoagulants: Secondary | ICD-10-CM | POA: Diagnosis not present

## 2013-07-06 DIAGNOSIS — I2510 Atherosclerotic heart disease of native coronary artery without angina pectoris: Secondary | ICD-10-CM | POA: Diagnosis not present

## 2013-07-06 DIAGNOSIS — Z954 Presence of other heart-valve replacement: Secondary | ICD-10-CM | POA: Diagnosis not present

## 2013-07-06 DIAGNOSIS — Z952 Presence of prosthetic heart valve: Secondary | ICD-10-CM

## 2013-07-06 NOTE — Patient Instructions (Addendum)
Spoke with patient.  He had held warfarin on 07/04/13 and restarted regular dose yesterday 07/05/13.  I recommended he decrease warfarin dose going forward to 2mg  daily except 3mg  on mondays.  Recheck INR in 1 week.

## 2013-07-07 ENCOUNTER — Encounter: Payer: Self-pay | Admitting: Family Medicine

## 2013-07-07 ENCOUNTER — Ambulatory Visit (INDEPENDENT_AMBULATORY_CARE_PROVIDER_SITE_OTHER): Payer: Medicare Other | Admitting: Family Medicine

## 2013-07-07 VITALS — BP 196/74 | HR 76 | Temp 99.2°F | Ht 69.5 in | Wt 194.4 lb

## 2013-07-07 DIAGNOSIS — M5412 Radiculopathy, cervical region: Secondary | ICD-10-CM | POA: Insufficient documentation

## 2013-07-07 DIAGNOSIS — N529 Male erectile dysfunction, unspecified: Secondary | ICD-10-CM | POA: Diagnosis not present

## 2013-07-07 DIAGNOSIS — E291 Testicular hypofunction: Secondary | ICD-10-CM

## 2013-07-07 DIAGNOSIS — M4802 Spinal stenosis, cervical region: Secondary | ICD-10-CM | POA: Diagnosis not present

## 2013-07-07 DIAGNOSIS — M542 Cervicalgia: Secondary | ICD-10-CM | POA: Insufficient documentation

## 2013-07-07 NOTE — Progress Notes (Signed)
   Subjective:    Patient ID: ANUP LORENZ, male    DOB: 04/27/1955, 58 y.o.   MRN: [id number]  HPI This 58 y.o. male presents for evaluation of routine visit.  He has had kidney transplant in 9/14 and is off dialysis.  He has anemia.   He has had labs by [facility] Transplant doctor and his Iris Pert was 1.7 and slightly low and he is on a mag replacement.  He is awaiting pulmonary referral for cough. He has otherwise normal testosterone, cmp and cbc.  His hgb is 9.9 and he is on injections rx'd by Hematology.  He has labs showing testosterone that is 349 and states he used to be in the 700's a few months back prior to his kidney transplant.  He has been wanting to have his testosterone elevated.  He states he did have ED problems with his testosterone level at 700's. He has not taken His bp meds yet today.   Review of Systems C/o ED and fatigue. No chest pain, SOB, HA, dizziness, vision change, N/V, diarrhea, constipation, dysuria, urinary urgency or frequency, myalgias, arthralgias or rash.     Objective:   Physical Exam BP 196/74  Pulse 76  Temp(Src) 99.2 F (37.3 C) (Oral)  Ht 5' 9.5" (1.765 m)  Wt 194 lb 6.4 oz (88.179 kg)  BMI 28.31 kg/m2 Vital signs noted  Well developed well nourished male.  HEENT - Head atraumatic Normocephalic                Eyes - PERRLA, Conjuctiva - clear Sclera- Clear EOMI                Ears - EAC's Wnl TM's Wnl Gross Hearing WNL                Nose - Nares patent                 Throat - oropharanx wnl Respiratory - Lungs CTA bilateral Cardiac - RRR S1 and S2 without murmur GI - Abdomen soft Nontender and bowel sounds active x 4 Extremities - No edema. Neuro - Grossly intact.       Assessment & Plan:  Hypogonadism male - Plan: Ambulatory referral to Urology  Impotence - Plan: Ambulatory referral to Urology  Hypertension - Take Bp meds, follow up with nephrology and follow up in one month  Lysbeth Penner FNP

## 2013-07-08 DIAGNOSIS — R0609 Other forms of dyspnea: Secondary | ICD-10-CM | POA: Diagnosis not present

## 2013-07-08 DIAGNOSIS — Z954 Presence of other heart-valve replacement: Secondary | ICD-10-CM | POA: Diagnosis not present

## 2013-07-08 DIAGNOSIS — I2510 Atherosclerotic heart disease of native coronary artery without angina pectoris: Secondary | ICD-10-CM | POA: Diagnosis not present

## 2013-07-11 DIAGNOSIS — Z954 Presence of other heart-valve replacement: Secondary | ICD-10-CM | POA: Diagnosis not present

## 2013-07-11 LAB — POCT INR: INR: 2.7

## 2013-07-14 ENCOUNTER — Ambulatory Visit (INDEPENDENT_AMBULATORY_CARE_PROVIDER_SITE_OTHER): Payer: Medicare Other | Admitting: Pharmacist

## 2013-07-14 DIAGNOSIS — R0609 Other forms of dyspnea: Secondary | ICD-10-CM | POA: Diagnosis not present

## 2013-07-14 DIAGNOSIS — Z954 Presence of other heart-valve replacement: Secondary | ICD-10-CM

## 2013-07-14 DIAGNOSIS — Z5181 Encounter for therapeutic drug level monitoring: Secondary | ICD-10-CM

## 2013-07-14 DIAGNOSIS — Z48298 Encounter for aftercare following other organ transplant: Secondary | ICD-10-CM | POA: Diagnosis not present

## 2013-07-14 DIAGNOSIS — Z952 Presence of prosthetic heart valve: Secondary | ICD-10-CM

## 2013-07-14 DIAGNOSIS — Z7901 Long term (current) use of anticoagulants: Secondary | ICD-10-CM

## 2013-07-14 DIAGNOSIS — E785 Hyperlipidemia, unspecified: Secondary | ICD-10-CM | POA: Diagnosis not present

## 2013-07-14 DIAGNOSIS — I2510 Atherosclerotic heart disease of native coronary artery without angina pectoris: Secondary | ICD-10-CM | POA: Diagnosis not present

## 2013-07-14 DIAGNOSIS — Z79899 Other long term (current) drug therapy: Secondary | ICD-10-CM | POA: Diagnosis not present

## 2013-07-14 DIAGNOSIS — Z940 Kidney transplant status: Secondary | ICD-10-CM | POA: Diagnosis not present

## 2013-07-14 DIAGNOSIS — I10 Essential (primary) hypertension: Secondary | ICD-10-CM | POA: Diagnosis not present

## 2013-07-15 DIAGNOSIS — I2510 Atherosclerotic heart disease of native coronary artery without angina pectoris: Secondary | ICD-10-CM | POA: Diagnosis not present

## 2013-07-15 DIAGNOSIS — R0989 Other specified symptoms and signs involving the circulatory and respiratory systems: Secondary | ICD-10-CM | POA: Diagnosis not present

## 2013-07-15 DIAGNOSIS — R0609 Other forms of dyspnea: Secondary | ICD-10-CM | POA: Diagnosis not present

## 2013-07-15 DIAGNOSIS — R079 Chest pain, unspecified: Secondary | ICD-10-CM | POA: Diagnosis not present

## 2013-07-18 ENCOUNTER — Ambulatory Visit (INDEPENDENT_AMBULATORY_CARE_PROVIDER_SITE_OTHER): Payer: Medicare Other | Admitting: Pharmacist

## 2013-07-18 DIAGNOSIS — Z954 Presence of other heart-valve replacement: Secondary | ICD-10-CM

## 2013-07-18 DIAGNOSIS — Z5181 Encounter for therapeutic drug level monitoring: Secondary | ICD-10-CM

## 2013-07-18 DIAGNOSIS — Z952 Presence of prosthetic heart valve: Secondary | ICD-10-CM

## 2013-07-18 DIAGNOSIS — Z7901 Long term (current) use of anticoagulants: Secondary | ICD-10-CM

## 2013-07-18 NOTE — Progress Notes (Signed)
No charge for labs or visit - INR check through home monitoring system  

## 2013-07-23 ENCOUNTER — Other Ambulatory Visit: Payer: Self-pay | Admitting: Family Medicine

## 2013-07-23 DIAGNOSIS — Z952 Presence of prosthetic heart valve: Secondary | ICD-10-CM

## 2013-07-25 DIAGNOSIS — E872 Acidosis, unspecified: Secondary | ICD-10-CM | POA: Diagnosis not present

## 2013-07-25 DIAGNOSIS — D631 Anemia in chronic kidney disease: Secondary | ICD-10-CM | POA: Diagnosis not present

## 2013-07-25 DIAGNOSIS — I2510 Atherosclerotic heart disease of native coronary artery without angina pectoris: Secondary | ICD-10-CM | POA: Diagnosis not present

## 2013-07-25 DIAGNOSIS — Z940 Kidney transplant status: Secondary | ICD-10-CM | POA: Diagnosis not present

## 2013-07-25 DIAGNOSIS — Z48298 Encounter for aftercare following other organ transplant: Secondary | ICD-10-CM | POA: Diagnosis not present

## 2013-07-25 DIAGNOSIS — D649 Anemia, unspecified: Secondary | ICD-10-CM | POA: Diagnosis not present

## 2013-07-25 DIAGNOSIS — I129 Hypertensive chronic kidney disease with stage 1 through stage 4 chronic kidney disease, or unspecified chronic kidney disease: Secondary | ICD-10-CM | POA: Diagnosis not present

## 2013-07-25 DIAGNOSIS — N182 Chronic kidney disease, stage 2 (mild): Secondary | ICD-10-CM | POA: Diagnosis not present

## 2013-07-25 DIAGNOSIS — D899 Disorder involving the immune mechanism, unspecified: Secondary | ICD-10-CM | POA: Diagnosis not present

## 2013-07-25 DIAGNOSIS — I10 Essential (primary) hypertension: Secondary | ICD-10-CM | POA: Diagnosis not present

## 2013-07-25 DIAGNOSIS — N039 Chronic nephritic syndrome with unspecified morphologic changes: Secondary | ICD-10-CM | POA: Diagnosis not present

## 2013-07-25 LAB — POCT INR: INR: 1.2

## 2013-07-26 NOTE — Telephone Encounter (Signed)
Called patient with INR results, he 1/10 off from being therapeutic.  Today is the day he takes three tablets so this should bump him up above 2.0.  If it is below 2 next week, I will increase one more day of the week to 3 tablets (would be 3 days total then).

## 2013-07-27 ENCOUNTER — Telehealth: Payer: Self-pay | Admitting: Pharmacist

## 2013-07-27 NOTE — Telephone Encounter (Signed)
Patient is due to check INR at home through [facility]  Called to remind him - [facility]

## 2013-07-29 DIAGNOSIS — Z961 Presence of intraocular lens: Secondary | ICD-10-CM | POA: Diagnosis not present

## 2013-08-01 DIAGNOSIS — M6281 Muscle weakness (generalized): Secondary | ICD-10-CM | POA: Diagnosis not present

## 2013-08-01 DIAGNOSIS — M542 Cervicalgia: Secondary | ICD-10-CM | POA: Diagnosis not present

## 2013-08-01 DIAGNOSIS — M5412 Radiculopathy, cervical region: Secondary | ICD-10-CM | POA: Diagnosis not present

## 2013-08-01 DIAGNOSIS — R279 Unspecified lack of coordination: Secondary | ICD-10-CM | POA: Diagnosis not present

## 2013-08-02 LAB — POCT INR: INR: 1.7

## 2013-08-03 ENCOUNTER — Ambulatory Visit (INDEPENDENT_AMBULATORY_CARE_PROVIDER_SITE_OTHER): Payer: Medicare Other | Admitting: Pharmacist

## 2013-08-03 DIAGNOSIS — Z5181 Encounter for therapeutic drug level monitoring: Secondary | ICD-10-CM

## 2013-08-03 DIAGNOSIS — I517 Cardiomegaly: Secondary | ICD-10-CM | POA: Diagnosis not present

## 2013-08-03 DIAGNOSIS — Z7901 Long term (current) use of anticoagulants: Secondary | ICD-10-CM

## 2013-08-03 DIAGNOSIS — Z954 Presence of other heart-valve replacement: Secondary | ICD-10-CM

## 2013-08-03 DIAGNOSIS — Z940 Kidney transplant status: Secondary | ICD-10-CM | POA: Diagnosis not present

## 2013-08-03 DIAGNOSIS — I4510 Unspecified right bundle-branch block: Secondary | ICD-10-CM | POA: Diagnosis not present

## 2013-08-03 DIAGNOSIS — I2510 Atherosclerotic heart disease of native coronary artery without angina pectoris: Secondary | ICD-10-CM | POA: Diagnosis not present

## 2013-08-03 DIAGNOSIS — Z952 Presence of prosthetic heart valve: Secondary | ICD-10-CM

## 2013-08-03 DIAGNOSIS — I359 Nonrheumatic aortic valve disorder, unspecified: Secondary | ICD-10-CM | POA: Diagnosis not present

## 2013-08-03 NOTE — Progress Notes (Signed)
No charge for labs or visit - INR check through home monitoring system  

## 2013-08-05 DIAGNOSIS — M6281 Muscle weakness (generalized): Secondary | ICD-10-CM | POA: Diagnosis not present

## 2013-08-05 DIAGNOSIS — M542 Cervicalgia: Secondary | ICD-10-CM | POA: Diagnosis not present

## 2013-08-05 DIAGNOSIS — R279 Unspecified lack of coordination: Secondary | ICD-10-CM | POA: Diagnosis not present

## 2013-08-05 DIAGNOSIS — M5412 Radiculopathy, cervical region: Secondary | ICD-10-CM | POA: Diagnosis not present

## 2013-08-08 DIAGNOSIS — M6281 Muscle weakness (generalized): Secondary | ICD-10-CM | POA: Diagnosis not present

## 2013-08-08 DIAGNOSIS — M5412 Radiculopathy, cervical region: Secondary | ICD-10-CM | POA: Diagnosis not present

## 2013-08-08 DIAGNOSIS — R279 Unspecified lack of coordination: Secondary | ICD-10-CM | POA: Diagnosis not present

## 2013-08-08 DIAGNOSIS — M542 Cervicalgia: Secondary | ICD-10-CM | POA: Diagnosis not present

## 2013-08-09 DIAGNOSIS — Z954 Presence of other heart-valve replacement: Secondary | ICD-10-CM | POA: Diagnosis not present

## 2013-08-09 LAB — POCT INR: INR: 2.4

## 2013-08-10 ENCOUNTER — Ambulatory Visit (INDEPENDENT_AMBULATORY_CARE_PROVIDER_SITE_OTHER): Payer: Medicare Other | Admitting: Pharmacist

## 2013-08-10 DIAGNOSIS — Z952 Presence of prosthetic heart valve: Secondary | ICD-10-CM

## 2013-08-10 DIAGNOSIS — Z7901 Long term (current) use of anticoagulants: Secondary | ICD-10-CM | POA: Diagnosis not present

## 2013-08-10 DIAGNOSIS — Z5181 Encounter for therapeutic drug level monitoring: Secondary | ICD-10-CM

## 2013-08-10 DIAGNOSIS — Z954 Presence of other heart-valve replacement: Secondary | ICD-10-CM

## 2013-08-12 DIAGNOSIS — M6281 Muscle weakness (generalized): Secondary | ICD-10-CM | POA: Diagnosis not present

## 2013-08-12 DIAGNOSIS — R279 Unspecified lack of coordination: Secondary | ICD-10-CM | POA: Diagnosis not present

## 2013-08-12 DIAGNOSIS — M5412 Radiculopathy, cervical region: Secondary | ICD-10-CM | POA: Diagnosis not present

## 2013-08-12 DIAGNOSIS — M542 Cervicalgia: Secondary | ICD-10-CM | POA: Diagnosis not present

## 2013-08-15 DIAGNOSIS — M5412 Radiculopathy, cervical region: Secondary | ICD-10-CM | POA: Diagnosis not present

## 2013-08-15 DIAGNOSIS — M542 Cervicalgia: Secondary | ICD-10-CM | POA: Diagnosis not present

## 2013-08-15 DIAGNOSIS — R279 Unspecified lack of coordination: Secondary | ICD-10-CM | POA: Diagnosis not present

## 2013-08-15 DIAGNOSIS — M6281 Muscle weakness (generalized): Secondary | ICD-10-CM | POA: Diagnosis not present

## 2013-08-16 DIAGNOSIS — D899 Disorder involving the immune mechanism, unspecified: Secondary | ICD-10-CM | POA: Diagnosis not present

## 2013-08-16 DIAGNOSIS — N2581 Secondary hyperparathyroidism of renal origin: Secondary | ICD-10-CM | POA: Diagnosis not present

## 2013-08-16 DIAGNOSIS — I10 Essential (primary) hypertension: Secondary | ICD-10-CM | POA: Diagnosis not present

## 2013-08-16 DIAGNOSIS — D649 Anemia, unspecified: Secondary | ICD-10-CM | POA: Diagnosis not present

## 2013-08-16 DIAGNOSIS — N183 Chronic kidney disease, stage 3 unspecified: Secondary | ICD-10-CM | POA: Diagnosis not present

## 2013-08-16 DIAGNOSIS — Z48298 Encounter for aftercare following other organ transplant: Secondary | ICD-10-CM | POA: Diagnosis not present

## 2013-08-16 DIAGNOSIS — E872 Acidosis, unspecified: Secondary | ICD-10-CM | POA: Diagnosis not present

## 2013-08-16 DIAGNOSIS — E559 Vitamin D deficiency, unspecified: Secondary | ICD-10-CM | POA: Insufficient documentation

## 2013-08-16 DIAGNOSIS — Z940 Kidney transplant status: Secondary | ICD-10-CM | POA: Diagnosis not present

## 2013-08-16 DIAGNOSIS — I129 Hypertensive chronic kidney disease with stage 1 through stage 4 chronic kidney disease, or unspecified chronic kidney disease: Secondary | ICD-10-CM | POA: Diagnosis not present

## 2013-08-17 ENCOUNTER — Ambulatory Visit (INDEPENDENT_AMBULATORY_CARE_PROVIDER_SITE_OTHER): Payer: Medicare Other | Admitting: Pharmacist

## 2013-08-17 DIAGNOSIS — Z5181 Encounter for therapeutic drug level monitoring: Secondary | ICD-10-CM

## 2013-08-17 DIAGNOSIS — Z952 Presence of prosthetic heart valve: Secondary | ICD-10-CM

## 2013-08-17 DIAGNOSIS — Z7901 Long term (current) use of anticoagulants: Secondary | ICD-10-CM

## 2013-08-17 DIAGNOSIS — Z954 Presence of other heart-valve replacement: Secondary | ICD-10-CM

## 2013-08-17 NOTE — Progress Notes (Signed)
INR results faxed from home INR monitoring [place] No charge for today

## 2013-08-17 NOTE — Patient Instructions (Signed)
Anticoagulation Dose Instructions as of 08/17/2013     Jeremy [name] Tue Wed Thu Fri Sat   New Dose 2 mg 2 mg 3 mg 2 mg 2 mg 3 mg 2 mg    Description       INR goal 2.0 to 3.0 3mg  today instead of 2mg , then resume regular dose of 3mg  on tuesdays and fridays and 2mg  all other days.

## 2013-08-18 DIAGNOSIS — R279 Unspecified lack of coordination: Secondary | ICD-10-CM | POA: Diagnosis not present

## 2013-08-18 DIAGNOSIS — M542 Cervicalgia: Secondary | ICD-10-CM | POA: Diagnosis not present

## 2013-08-18 DIAGNOSIS — M6281 Muscle weakness (generalized): Secondary | ICD-10-CM | POA: Diagnosis not present

## 2013-08-18 DIAGNOSIS — M5412 Radiculopathy, cervical region: Secondary | ICD-10-CM | POA: Diagnosis not present

## 2013-08-22 LAB — POCT INR: INR: 1.7

## 2013-08-23 DIAGNOSIS — M6281 Muscle weakness (generalized): Secondary | ICD-10-CM | POA: Diagnosis not present

## 2013-08-23 DIAGNOSIS — M542 Cervicalgia: Secondary | ICD-10-CM | POA: Diagnosis not present

## 2013-08-23 DIAGNOSIS — M5412 Radiculopathy, cervical region: Secondary | ICD-10-CM | POA: Diagnosis not present

## 2013-08-23 DIAGNOSIS — R279 Unspecified lack of coordination: Secondary | ICD-10-CM | POA: Diagnosis not present

## 2013-08-25 DIAGNOSIS — N529 Male erectile dysfunction, unspecified: Secondary | ICD-10-CM | POA: Diagnosis not present

## 2013-08-26 DIAGNOSIS — R279 Unspecified lack of coordination: Secondary | ICD-10-CM | POA: Diagnosis not present

## 2013-08-26 DIAGNOSIS — M5412 Radiculopathy, cervical region: Secondary | ICD-10-CM | POA: Diagnosis not present

## 2013-08-26 DIAGNOSIS — M6281 Muscle weakness (generalized): Secondary | ICD-10-CM | POA: Diagnosis not present

## 2013-08-26 DIAGNOSIS — M542 Cervicalgia: Secondary | ICD-10-CM | POA: Diagnosis not present

## 2013-08-30 DIAGNOSIS — M542 Cervicalgia: Secondary | ICD-10-CM | POA: Diagnosis not present

## 2013-08-30 DIAGNOSIS — R279 Unspecified lack of coordination: Secondary | ICD-10-CM | POA: Diagnosis not present

## 2013-08-30 DIAGNOSIS — M5412 Radiculopathy, cervical region: Secondary | ICD-10-CM | POA: Diagnosis not present

## 2013-08-30 DIAGNOSIS — M6281 Muscle weakness (generalized): Secondary | ICD-10-CM | POA: Diagnosis not present

## 2013-09-02 DIAGNOSIS — M542 Cervicalgia: Secondary | ICD-10-CM | POA: Diagnosis not present

## 2013-09-02 DIAGNOSIS — M5412 Radiculopathy, cervical region: Secondary | ICD-10-CM | POA: Diagnosis not present

## 2013-09-02 DIAGNOSIS — R279 Unspecified lack of coordination: Secondary | ICD-10-CM | POA: Diagnosis not present

## 2013-09-02 DIAGNOSIS — M6281 Muscle weakness (generalized): Secondary | ICD-10-CM | POA: Diagnosis not present

## 2013-09-05 DIAGNOSIS — Z954 Presence of other heart-valve replacement: Secondary | ICD-10-CM | POA: Diagnosis not present

## 2013-09-05 LAB — POCT INR: INR: 1.7

## 2013-09-06 ENCOUNTER — Ambulatory Visit (INDEPENDENT_AMBULATORY_CARE_PROVIDER_SITE_OTHER): Payer: Medicare Other | Admitting: Pharmacist

## 2013-09-06 DIAGNOSIS — M542 Cervicalgia: Secondary | ICD-10-CM | POA: Diagnosis not present

## 2013-09-06 DIAGNOSIS — M5412 Radiculopathy, cervical region: Secondary | ICD-10-CM | POA: Diagnosis not present

## 2013-09-06 DIAGNOSIS — D649 Anemia, unspecified: Secondary | ICD-10-CM

## 2013-09-06 DIAGNOSIS — I359 Nonrheumatic aortic valve disorder, unspecified: Secondary | ICD-10-CM

## 2013-09-06 DIAGNOSIS — Z5181 Encounter for therapeutic drug level monitoring: Secondary | ICD-10-CM

## 2013-09-06 DIAGNOSIS — M6281 Muscle weakness (generalized): Secondary | ICD-10-CM | POA: Diagnosis not present

## 2013-09-06 DIAGNOSIS — Z7901 Long term (current) use of anticoagulants: Secondary | ICD-10-CM

## 2013-09-06 DIAGNOSIS — Z952 Presence of prosthetic heart valve: Secondary | ICD-10-CM

## 2013-09-06 DIAGNOSIS — Z954 Presence of other heart-valve replacement: Secondary | ICD-10-CM | POA: Diagnosis not present

## 2013-09-06 DIAGNOSIS — R279 Unspecified lack of coordination: Secondary | ICD-10-CM | POA: Diagnosis not present

## 2013-09-06 NOTE — Progress Notes (Signed)
Patient checks INR at home with Home INR monitoring.  Billing once per month interupertation fee.  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  The nephrologist is going to recheck CBC next week.  I am concerned that if Hematocrit is below recommended range for the INR Ratio monitor that we may not be getting accurate INR reading.     Due to possible low hematocrit patient will come in for INR (will send to [facility]) and CBC tomorrow Continue regular dose of 3mg  on tuesdays and fridays and 2mg  all other days until labs available.

## 2013-09-07 ENCOUNTER — Other Ambulatory Visit (INDEPENDENT_AMBULATORY_CARE_PROVIDER_SITE_OTHER): Payer: Medicare Other

## 2013-09-07 DIAGNOSIS — D649 Anemia, unspecified: Secondary | ICD-10-CM | POA: Diagnosis not present

## 2013-09-07 DIAGNOSIS — Z954 Presence of other heart-valve replacement: Secondary | ICD-10-CM

## 2013-09-07 DIAGNOSIS — Z5181 Encounter for therapeutic drug level monitoring: Secondary | ICD-10-CM

## 2013-09-07 DIAGNOSIS — Z7901 Long term (current) use of anticoagulants: Secondary | ICD-10-CM | POA: Diagnosis not present

## 2013-09-07 DIAGNOSIS — Z952 Presence of prosthetic heart valve: Secondary | ICD-10-CM

## 2013-09-07 LAB — POCT CBC
Granulocyte percent: 70.5 %G (ref 37–80)
HCT, POC: 30.9 % — AB (ref 43.5–53.7)
HEMOGLOBIN: 9.6 g/dL — AB (ref 14.1–18.1)
Lymph, poc: 0.7 (ref 0.6–3.4)
MCH: 27.1 pg (ref 27–31.2)
MCHC: 31.2 g/dL — AB (ref 31.8–35.4)
MCV: 86.9 fL (ref 80–97)
MPV: 8 fL (ref 0–99.8)
POC Granulocyte: 2.8 (ref 2–6.9)
POC LYMPH PERCENT: 17.9 %L (ref 10–50)
Platelet Count, POC: 137 10*3/uL — AB (ref 142–424)
RBC: 3.6 M/uL — AB (ref 4.69–6.13)
RDW, POC: 13.4 %
WBC: 4 10*3/uL — AB (ref 4.6–10.2)

## 2013-09-08 ENCOUNTER — Ambulatory Visit (INDEPENDENT_AMBULATORY_CARE_PROVIDER_SITE_OTHER): Payer: Medicare Other | Admitting: Pharmacist

## 2013-09-08 ENCOUNTER — Encounter: Payer: Self-pay | Admitting: Pharmacist

## 2013-09-08 DIAGNOSIS — M5412 Radiculopathy, cervical region: Secondary | ICD-10-CM | POA: Diagnosis not present

## 2013-09-08 DIAGNOSIS — R279 Unspecified lack of coordination: Secondary | ICD-10-CM | POA: Diagnosis not present

## 2013-09-08 DIAGNOSIS — Z5181 Encounter for therapeutic drug level monitoring: Secondary | ICD-10-CM

## 2013-09-08 DIAGNOSIS — M542 Cervicalgia: Secondary | ICD-10-CM | POA: Diagnosis not present

## 2013-09-08 DIAGNOSIS — M6281 Muscle weakness (generalized): Secondary | ICD-10-CM | POA: Diagnosis not present

## 2013-09-08 DIAGNOSIS — Z7901 Long term (current) use of anticoagulants: Secondary | ICD-10-CM

## 2013-09-08 DIAGNOSIS — Z952 Presence of prosthetic heart valve: Secondary | ICD-10-CM

## 2013-09-08 DIAGNOSIS — Z954 Presence of other heart-valve replacement: Secondary | ICD-10-CM

## 2013-09-08 LAB — PROTIME-INR
INR: 1.7 — ABNORMAL HIGH (ref 0.8–1.2)
Prothrombin Time: 17.8 s — ABNORMAL HIGH (ref 9.1–12.0)

## 2013-09-08 NOTE — Telephone Encounter (Signed)
Patient notified of results and recommended changes in warfarin - see anticoag note

## 2013-09-13 DIAGNOSIS — M6281 Muscle weakness (generalized): Secondary | ICD-10-CM | POA: Diagnosis not present

## 2013-09-13 DIAGNOSIS — M542 Cervicalgia: Secondary | ICD-10-CM | POA: Diagnosis not present

## 2013-09-13 DIAGNOSIS — M5412 Radiculopathy, cervical region: Secondary | ICD-10-CM | POA: Diagnosis not present

## 2013-09-13 DIAGNOSIS — R279 Unspecified lack of coordination: Secondary | ICD-10-CM | POA: Diagnosis not present

## 2013-09-13 LAB — POCT INR: INR: 2

## 2013-09-14 ENCOUNTER — Ambulatory Visit (INDEPENDENT_AMBULATORY_CARE_PROVIDER_SITE_OTHER): Payer: Medicare Other | Admitting: Pharmacist

## 2013-09-14 DIAGNOSIS — Z5181 Encounter for therapeutic drug level monitoring: Secondary | ICD-10-CM

## 2013-09-14 DIAGNOSIS — Z954 Presence of other heart-valve replacement: Secondary | ICD-10-CM

## 2013-09-14 DIAGNOSIS — Z952 Presence of prosthetic heart valve: Secondary | ICD-10-CM

## 2013-09-14 DIAGNOSIS — Z7901 Long term (current) use of anticoagulants: Secondary | ICD-10-CM

## 2013-09-14 NOTE — Progress Notes (Signed)
Patient checks INR at home with Home INR monitoring.  No charge for labs or visit

## 2013-09-15 DIAGNOSIS — Z48298 Encounter for aftercare following other organ transplant: Secondary | ICD-10-CM | POA: Diagnosis not present

## 2013-09-15 DIAGNOSIS — D631 Anemia in chronic kidney disease: Secondary | ICD-10-CM | POA: Diagnosis not present

## 2013-09-15 DIAGNOSIS — E559 Vitamin D deficiency, unspecified: Secondary | ICD-10-CM | POA: Diagnosis not present

## 2013-09-15 DIAGNOSIS — Z79899 Other long term (current) drug therapy: Secondary | ICD-10-CM | POA: Diagnosis not present

## 2013-09-15 DIAGNOSIS — Z940 Kidney transplant status: Secondary | ICD-10-CM | POA: Diagnosis not present

## 2013-09-15 DIAGNOSIS — N039 Chronic nephritic syndrome with unspecified morphologic changes: Secondary | ICD-10-CM | POA: Diagnosis not present

## 2013-09-15 DIAGNOSIS — D899 Disorder involving the immune mechanism, unspecified: Secondary | ICD-10-CM | POA: Diagnosis not present

## 2013-09-15 DIAGNOSIS — N183 Chronic kidney disease, stage 3 unspecified: Secondary | ICD-10-CM | POA: Diagnosis not present

## 2013-09-15 DIAGNOSIS — I129 Hypertensive chronic kidney disease with stage 1 through stage 4 chronic kidney disease, or unspecified chronic kidney disease: Secondary | ICD-10-CM | POA: Diagnosis not present

## 2013-09-20 DIAGNOSIS — L282 Other prurigo: Secondary | ICD-10-CM | POA: Diagnosis not present

## 2013-09-20 DIAGNOSIS — L570 Actinic keratosis: Secondary | ICD-10-CM | POA: Diagnosis not present

## 2013-09-21 ENCOUNTER — Ambulatory Visit (INDEPENDENT_AMBULATORY_CARE_PROVIDER_SITE_OTHER): Payer: Medicare Other | Admitting: Pharmacist

## 2013-09-21 DIAGNOSIS — Z952 Presence of prosthetic heart valve: Secondary | ICD-10-CM

## 2013-09-21 DIAGNOSIS — Z5181 Encounter for therapeutic drug level monitoring: Secondary | ICD-10-CM

## 2013-09-21 DIAGNOSIS — Z954 Presence of other heart-valve replacement: Secondary | ICD-10-CM

## 2013-09-21 DIAGNOSIS — Z7901 Long term (current) use of anticoagulants: Secondary | ICD-10-CM

## 2013-09-21 NOTE — Progress Notes (Signed)
No charge for labs or visit - INR check through home monitoring system  

## 2013-09-23 DIAGNOSIS — D631 Anemia in chronic kidney disease: Secondary | ICD-10-CM | POA: Diagnosis not present

## 2013-09-23 DIAGNOSIS — N039 Chronic nephritic syndrome with unspecified morphologic changes: Secondary | ICD-10-CM | POA: Diagnosis not present

## 2013-09-23 DIAGNOSIS — M542 Cervicalgia: Secondary | ICD-10-CM | POA: Diagnosis not present

## 2013-09-23 DIAGNOSIS — M6281 Muscle weakness (generalized): Secondary | ICD-10-CM | POA: Diagnosis not present

## 2013-09-23 DIAGNOSIS — M5412 Radiculopathy, cervical region: Secondary | ICD-10-CM | POA: Diagnosis not present

## 2013-09-23 DIAGNOSIS — N183 Chronic kidney disease, stage 3 unspecified: Secondary | ICD-10-CM | POA: Diagnosis not present

## 2013-09-23 DIAGNOSIS — R279 Unspecified lack of coordination: Secondary | ICD-10-CM | POA: Diagnosis not present

## 2013-09-27 LAB — POCT INR: INR: 2.6

## 2013-09-30 DIAGNOSIS — N183 Chronic kidney disease, stage 3 unspecified: Secondary | ICD-10-CM | POA: Diagnosis not present

## 2013-09-30 DIAGNOSIS — D631 Anemia in chronic kidney disease: Secondary | ICD-10-CM | POA: Diagnosis not present

## 2013-10-05 DIAGNOSIS — Z954 Presence of other heart-valve replacement: Secondary | ICD-10-CM | POA: Diagnosis not present

## 2013-10-05 LAB — POCT INR: INR: 2.3

## 2013-10-06 ENCOUNTER — Ambulatory Visit (INDEPENDENT_AMBULATORY_CARE_PROVIDER_SITE_OTHER): Payer: Medicare Other | Admitting: Pharmacist

## 2013-10-06 DIAGNOSIS — N039 Chronic nephritic syndrome with unspecified morphologic changes: Secondary | ICD-10-CM | POA: Diagnosis not present

## 2013-10-06 DIAGNOSIS — D631 Anemia in chronic kidney disease: Secondary | ICD-10-CM | POA: Diagnosis not present

## 2013-10-06 DIAGNOSIS — N183 Chronic kidney disease, stage 3 unspecified: Secondary | ICD-10-CM | POA: Diagnosis not present

## 2013-10-06 DIAGNOSIS — Z7901 Long term (current) use of anticoagulants: Secondary | ICD-10-CM

## 2013-10-06 DIAGNOSIS — Z5181 Encounter for therapeutic drug level monitoring: Secondary | ICD-10-CM

## 2013-10-06 DIAGNOSIS — Z954 Presence of other heart-valve replacement: Secondary | ICD-10-CM

## 2013-10-06 DIAGNOSIS — Z952 Presence of prosthetic heart valve: Secondary | ICD-10-CM

## 2013-10-12 ENCOUNTER — Ambulatory Visit (INDEPENDENT_AMBULATORY_CARE_PROVIDER_SITE_OTHER): Payer: Medicare Other | Admitting: Pharmacist

## 2013-10-12 DIAGNOSIS — Z954 Presence of other heart-valve replacement: Secondary | ICD-10-CM | POA: Diagnosis not present

## 2013-10-12 DIAGNOSIS — Z952 Presence of prosthetic heart valve: Secondary | ICD-10-CM

## 2013-10-12 DIAGNOSIS — Z5181 Encounter for therapeutic drug level monitoring: Secondary | ICD-10-CM

## 2013-10-12 DIAGNOSIS — I359 Nonrheumatic aortic valve disorder, unspecified: Secondary | ICD-10-CM

## 2013-10-12 DIAGNOSIS — Z7901 Long term (current) use of anticoagulants: Secondary | ICD-10-CM

## 2013-10-12 LAB — POCT INR: INR: 2.5

## 2013-10-12 NOTE — Progress Notes (Signed)
Patient checks INR at home with Home INR monitoring.  Billing once per month interupertation fee.  Patient diagnosis - venous thromboembolism.  Procedure code if [id number]

## 2013-10-17 DIAGNOSIS — N2581 Secondary hyperparathyroidism of renal origin: Secondary | ICD-10-CM | POA: Diagnosis not present

## 2013-10-17 DIAGNOSIS — Z48298 Encounter for aftercare following other organ transplant: Secondary | ICD-10-CM | POA: Diagnosis not present

## 2013-10-17 DIAGNOSIS — N183 Chronic kidney disease, stage 3 unspecified: Secondary | ICD-10-CM | POA: Diagnosis not present

## 2013-10-17 DIAGNOSIS — E559 Vitamin D deficiency, unspecified: Secondary | ICD-10-CM | POA: Diagnosis not present

## 2013-10-17 DIAGNOSIS — Z940 Kidney transplant status: Secondary | ICD-10-CM | POA: Diagnosis not present

## 2013-10-17 DIAGNOSIS — I10 Essential (primary) hypertension: Secondary | ICD-10-CM | POA: Diagnosis not present

## 2013-10-17 DIAGNOSIS — D899 Disorder involving the immune mechanism, unspecified: Secondary | ICD-10-CM | POA: Diagnosis not present

## 2013-10-18 DIAGNOSIS — N1330 Unspecified hydronephrosis: Secondary | ICD-10-CM | POA: Diagnosis not present

## 2013-10-18 DIAGNOSIS — Z0389 Encounter for observation for other suspected diseases and conditions ruled out: Secondary | ICD-10-CM | POA: Diagnosis not present

## 2013-10-18 DIAGNOSIS — Z1289 Encounter for screening for malignant neoplasm of other sites: Secondary | ICD-10-CM | POA: Diagnosis not present

## 2013-10-18 DIAGNOSIS — Z940 Kidney transplant status: Secondary | ICD-10-CM | POA: Diagnosis not present

## 2013-10-18 DIAGNOSIS — R9389 Abnormal findings on diagnostic imaging of other specified body structures: Secondary | ICD-10-CM | POA: Diagnosis not present

## 2013-10-18 DIAGNOSIS — C649 Malignant neoplasm of unspecified kidney, except renal pelvis: Secondary | ICD-10-CM | POA: Diagnosis not present

## 2013-10-20 LAB — POCT INR: INR: 2

## 2013-10-21 ENCOUNTER — Ambulatory Visit (INDEPENDENT_AMBULATORY_CARE_PROVIDER_SITE_OTHER): Payer: Medicare Other | Admitting: Pharmacist

## 2013-10-21 DIAGNOSIS — Z7901 Long term (current) use of anticoagulants: Secondary | ICD-10-CM

## 2013-10-21 DIAGNOSIS — Z5181 Encounter for therapeutic drug level monitoring: Secondary | ICD-10-CM

## 2013-10-21 DIAGNOSIS — D631 Anemia in chronic kidney disease: Secondary | ICD-10-CM | POA: Diagnosis not present

## 2013-10-21 DIAGNOSIS — Z954 Presence of other heart-valve replacement: Secondary | ICD-10-CM

## 2013-10-21 DIAGNOSIS — Z952 Presence of prosthetic heart valve: Secondary | ICD-10-CM

## 2013-10-21 DIAGNOSIS — N183 Chronic kidney disease, stage 3 unspecified: Secondary | ICD-10-CM | POA: Diagnosis not present

## 2013-10-26 LAB — POCT INR: INR: 2.1

## 2013-10-27 ENCOUNTER — Ambulatory Visit (INDEPENDENT_AMBULATORY_CARE_PROVIDER_SITE_OTHER): Payer: Medicare Other | Admitting: Pharmacist

## 2013-10-27 DIAGNOSIS — Z5181 Encounter for therapeutic drug level monitoring: Secondary | ICD-10-CM

## 2013-10-27 DIAGNOSIS — Z7901 Long term (current) use of anticoagulants: Secondary | ICD-10-CM

## 2013-10-27 DIAGNOSIS — Z952 Presence of prosthetic heart valve: Secondary | ICD-10-CM

## 2013-10-27 DIAGNOSIS — Z954 Presence of other heart-valve replacement: Secondary | ICD-10-CM

## 2013-11-03 ENCOUNTER — Ambulatory Visit (INDEPENDENT_AMBULATORY_CARE_PROVIDER_SITE_OTHER): Payer: Medicare Other | Admitting: Pharmacist

## 2013-11-03 DIAGNOSIS — Z952 Presence of prosthetic heart valve: Secondary | ICD-10-CM

## 2013-11-03 DIAGNOSIS — Z954 Presence of other heart-valve replacement: Secondary | ICD-10-CM

## 2013-11-03 DIAGNOSIS — Z5181 Encounter for therapeutic drug level monitoring: Secondary | ICD-10-CM

## 2013-11-03 DIAGNOSIS — Z7901 Long term (current) use of anticoagulants: Secondary | ICD-10-CM

## 2013-11-03 LAB — POCT INR: INR: 2.8

## 2013-11-09 ENCOUNTER — Other Ambulatory Visit (INDEPENDENT_AMBULATORY_CARE_PROVIDER_SITE_OTHER): Payer: Self-pay | Admitting: Internal Medicine

## 2013-11-09 NOTE — Telephone Encounter (Signed)
Per Dr.Rehman may fill with 5 refills 

## 2013-11-10 ENCOUNTER — Ambulatory Visit (INDEPENDENT_AMBULATORY_CARE_PROVIDER_SITE_OTHER): Payer: Medicare Other | Admitting: Pharmacist

## 2013-11-10 DIAGNOSIS — Z7901 Long term (current) use of anticoagulants: Secondary | ICD-10-CM

## 2013-11-10 DIAGNOSIS — Z952 Presence of prosthetic heart valve: Secondary | ICD-10-CM

## 2013-11-10 DIAGNOSIS — Z5181 Encounter for therapeutic drug level monitoring: Secondary | ICD-10-CM | POA: Diagnosis not present

## 2013-11-10 DIAGNOSIS — Z954 Presence of other heart-valve replacement: Secondary | ICD-10-CM | POA: Diagnosis not present

## 2013-11-10 DIAGNOSIS — I359 Nonrheumatic aortic valve disorder, unspecified: Secondary | ICD-10-CM | POA: Diagnosis not present

## 2013-11-10 LAB — POCT INR: INR: 2.7

## 2013-11-10 NOTE — Progress Notes (Signed)
Patient checks INR at home with Home INR monitoring.  Billing once per month interupertation fee.  Patient diagnosis - venous thromboembolism.  Procedure code if [id number]

## 2013-11-17 ENCOUNTER — Ambulatory Visit (INDEPENDENT_AMBULATORY_CARE_PROVIDER_SITE_OTHER): Payer: Medicare Other | Admitting: Pharmacist

## 2013-11-17 DIAGNOSIS — Z5181 Encounter for therapeutic drug level monitoring: Secondary | ICD-10-CM

## 2013-11-17 DIAGNOSIS — Z954 Presence of other heart-valve replacement: Secondary | ICD-10-CM

## 2013-11-17 DIAGNOSIS — Z7901 Long term (current) use of anticoagulants: Secondary | ICD-10-CM

## 2013-11-17 DIAGNOSIS — Z952 Presence of prosthetic heart valve: Secondary | ICD-10-CM

## 2013-11-17 LAB — POCT INR: INR: 2.8

## 2013-11-18 DIAGNOSIS — D631 Anemia in chronic kidney disease: Secondary | ICD-10-CM | POA: Diagnosis not present

## 2013-11-18 DIAGNOSIS — N039 Chronic nephritic syndrome with unspecified morphologic changes: Secondary | ICD-10-CM | POA: Diagnosis not present

## 2013-11-18 DIAGNOSIS — N186 End stage renal disease: Secondary | ICD-10-CM | POA: Diagnosis not present

## 2013-11-28 ENCOUNTER — Ambulatory Visit (INDEPENDENT_AMBULATORY_CARE_PROVIDER_SITE_OTHER): Payer: Medicare Other | Admitting: Pharmacist

## 2013-11-28 DIAGNOSIS — Z5181 Encounter for therapeutic drug level monitoring: Secondary | ICD-10-CM

## 2013-11-28 DIAGNOSIS — Z7901 Long term (current) use of anticoagulants: Secondary | ICD-10-CM

## 2013-11-28 DIAGNOSIS — Z952 Presence of prosthetic heart valve: Secondary | ICD-10-CM

## 2013-11-28 DIAGNOSIS — Z954 Presence of other heart-valve replacement: Secondary | ICD-10-CM

## 2013-11-28 LAB — POCT INR: INR: 1.6

## 2013-12-02 ENCOUNTER — Ambulatory Visit (INDEPENDENT_AMBULATORY_CARE_PROVIDER_SITE_OTHER): Payer: Medicare Other | Admitting: Pharmacist

## 2013-12-02 ENCOUNTER — Other Ambulatory Visit: Payer: Self-pay

## 2013-12-02 DIAGNOSIS — Z952 Presence of prosthetic heart valve: Secondary | ICD-10-CM

## 2013-12-02 DIAGNOSIS — Z5181 Encounter for therapeutic drug level monitoring: Secondary | ICD-10-CM

## 2013-12-02 DIAGNOSIS — Z7901 Long term (current) use of anticoagulants: Secondary | ICD-10-CM

## 2013-12-02 DIAGNOSIS — Z954 Presence of other heart-valve replacement: Secondary | ICD-10-CM

## 2013-12-02 LAB — POCT INR: INR: 2.7

## 2013-12-02 MED ORDER — NITROGLYCERIN 0.4 MG SL SUBL: 0.4000 mg | SUBLINGUAL_TABLET | SUBLINGUAL | Status: DC | PRN

## 2013-12-02 NOTE — Telephone Encounter (Signed)
Last seen 07/07/13  B [place]

## 2013-12-02 NOTE — Progress Notes (Signed)
Patient checks INR at home with Home INR monitoring.

## 2013-12-07 DIAGNOSIS — I2510 Atherosclerotic heart disease of native coronary artery without angina pectoris: Secondary | ICD-10-CM | POA: Diagnosis not present

## 2013-12-07 DIAGNOSIS — I120 Hypertensive chronic kidney disease with stage 5 chronic kidney disease or end stage renal disease: Secondary | ICD-10-CM | POA: Diagnosis not present

## 2013-12-07 DIAGNOSIS — N186 End stage renal disease: Secondary | ICD-10-CM | POA: Diagnosis not present

## 2013-12-07 DIAGNOSIS — I10 Essential (primary) hypertension: Secondary | ICD-10-CM | POA: Diagnosis not present

## 2013-12-07 DIAGNOSIS — D8989 Other specified disorders involving the immune mechanism, not elsewhere classified: Secondary | ICD-10-CM | POA: Diagnosis not present

## 2013-12-07 DIAGNOSIS — D899 Disorder involving the immune mechanism, unspecified: Secondary | ICD-10-CM | POA: Diagnosis not present

## 2013-12-07 DIAGNOSIS — Z9225 Personal history of immunosupression therapy: Secondary | ICD-10-CM | POA: Diagnosis not present

## 2013-12-07 DIAGNOSIS — Z940 Kidney transplant status: Secondary | ICD-10-CM | POA: Diagnosis not present

## 2013-12-07 DIAGNOSIS — Z79899 Other long term (current) drug therapy: Secondary | ICD-10-CM | POA: Diagnosis not present

## 2013-12-07 DIAGNOSIS — Z7901 Long term (current) use of anticoagulants: Secondary | ICD-10-CM | POA: Diagnosis not present

## 2013-12-07 DIAGNOSIS — D631 Anemia in chronic kidney disease: Secondary | ICD-10-CM | POA: Diagnosis not present

## 2013-12-07 DIAGNOSIS — D649 Anemia, unspecified: Secondary | ICD-10-CM | POA: Diagnosis not present

## 2013-12-09 DIAGNOSIS — Z954 Presence of other heart-valve replacement: Secondary | ICD-10-CM | POA: Diagnosis not present

## 2013-12-09 LAB — POCT INR: INR: 1.7

## 2013-12-13 ENCOUNTER — Ambulatory Visit (INDEPENDENT_AMBULATORY_CARE_PROVIDER_SITE_OTHER): Payer: Medicare Other | Admitting: Pharmacist

## 2013-12-13 DIAGNOSIS — Z7901 Long term (current) use of anticoagulants: Secondary | ICD-10-CM

## 2013-12-13 DIAGNOSIS — I359 Nonrheumatic aortic valve disorder, unspecified: Secondary | ICD-10-CM

## 2013-12-13 DIAGNOSIS — Z952 Presence of prosthetic heart valve: Secondary | ICD-10-CM

## 2013-12-13 DIAGNOSIS — Z954 Presence of other heart-valve replacement: Secondary | ICD-10-CM | POA: Diagnosis not present

## 2013-12-13 DIAGNOSIS — Z5181 Encounter for therapeutic drug level monitoring: Secondary | ICD-10-CM

## 2013-12-13 NOTE — Progress Notes (Signed)
Patient checks INR at home with Home INR monitoring.  Billing once per month interupertation fee.  Patient diagnosis - venous thromboembolism.  Procedure code if [id number]

## 2013-12-16 ENCOUNTER — Ambulatory Visit (INDEPENDENT_AMBULATORY_CARE_PROVIDER_SITE_OTHER): Payer: Medicare Other | Admitting: Pharmacist

## 2013-12-16 DIAGNOSIS — Z7901 Long term (current) use of anticoagulants: Secondary | ICD-10-CM

## 2013-12-16 DIAGNOSIS — Z5181 Encounter for therapeutic drug level monitoring: Secondary | ICD-10-CM

## 2013-12-16 DIAGNOSIS — Z954 Presence of other heart-valve replacement: Secondary | ICD-10-CM

## 2013-12-16 DIAGNOSIS — Z952 Presence of prosthetic heart valve: Secondary | ICD-10-CM

## 2013-12-16 LAB — POCT INR: INR: 1.9

## 2013-12-16 NOTE — Progress Notes (Signed)
No charge for labs or visit - INR check through home monitoring system  

## 2013-12-19 DIAGNOSIS — N183 Chronic kidney disease, stage 3 unspecified: Secondary | ICD-10-CM | POA: Diagnosis not present

## 2013-12-19 DIAGNOSIS — D631 Anemia in chronic kidney disease: Secondary | ICD-10-CM | POA: Diagnosis not present

## 2013-12-24 LAB — POCT INR: INR: 2.4

## 2013-12-26 ENCOUNTER — Ambulatory Visit (INDEPENDENT_AMBULATORY_CARE_PROVIDER_SITE_OTHER): Payer: Medicare Other | Admitting: Pharmacist

## 2013-12-26 DIAGNOSIS — Z7901 Long term (current) use of anticoagulants: Secondary | ICD-10-CM

## 2013-12-26 DIAGNOSIS — Z954 Presence of other heart-valve replacement: Secondary | ICD-10-CM

## 2013-12-26 DIAGNOSIS — Z5181 Encounter for therapeutic drug level monitoring: Secondary | ICD-10-CM

## 2013-12-26 DIAGNOSIS — Z952 Presence of prosthetic heart valve: Secondary | ICD-10-CM

## 2014-01-02 ENCOUNTER — Ambulatory Visit (INDEPENDENT_AMBULATORY_CARE_PROVIDER_SITE_OTHER): Payer: Medicare Other | Admitting: Pharmacist

## 2014-01-02 DIAGNOSIS — Z7901 Long term (current) use of anticoagulants: Secondary | ICD-10-CM

## 2014-01-02 DIAGNOSIS — Z952 Presence of prosthetic heart valve: Secondary | ICD-10-CM

## 2014-01-02 DIAGNOSIS — Z5181 Encounter for therapeutic drug level monitoring: Secondary | ICD-10-CM

## 2014-01-02 DIAGNOSIS — Z954 Presence of other heart-valve replacement: Secondary | ICD-10-CM

## 2014-01-02 LAB — POCT INR: INR: 2.4

## 2014-01-09 DIAGNOSIS — N183 Chronic kidney disease, stage 3 (moderate): Secondary | ICD-10-CM | POA: Diagnosis not present

## 2014-01-09 DIAGNOSIS — I10 Essential (primary) hypertension: Secondary | ICD-10-CM | POA: Diagnosis not present

## 2014-01-09 DIAGNOSIS — E559 Vitamin D deficiency, unspecified: Secondary | ICD-10-CM | POA: Diagnosis not present

## 2014-01-09 DIAGNOSIS — Z940 Kidney transplant status: Secondary | ICD-10-CM | POA: Diagnosis not present

## 2014-01-09 DIAGNOSIS — N2581 Secondary hyperparathyroidism of renal origin: Secondary | ICD-10-CM | POA: Diagnosis not present

## 2014-01-09 DIAGNOSIS — D899 Disorder involving the immune mechanism, unspecified: Secondary | ICD-10-CM | POA: Diagnosis not present

## 2014-01-10 DIAGNOSIS — Z952 Presence of prosthetic heart valve: Secondary | ICD-10-CM | POA: Diagnosis not present

## 2014-01-10 LAB — POCT INR: INR: 3.4

## 2014-01-11 ENCOUNTER — Ambulatory Visit (INDEPENDENT_AMBULATORY_CARE_PROVIDER_SITE_OTHER): Payer: Medicare Other | Admitting: Pharmacist

## 2014-01-11 DIAGNOSIS — Z5181 Encounter for therapeutic drug level monitoring: Secondary | ICD-10-CM

## 2014-01-11 DIAGNOSIS — Z952 Presence of prosthetic heart valve: Secondary | ICD-10-CM

## 2014-01-11 DIAGNOSIS — Z7901 Long term (current) use of anticoagulants: Secondary | ICD-10-CM | POA: Diagnosis not present

## 2014-01-11 DIAGNOSIS — Z954 Presence of other heart-valve replacement: Secondary | ICD-10-CM

## 2014-01-11 MED ORDER — WARFARIN SODIUM 1 MG PO TABS: ORAL_TABLET | ORAL | Status: DC

## 2014-01-11 NOTE — Progress Notes (Signed)
Patient checks INR at home with Home INR monitoring.  Billing once per month interupertation fee.  Patient diagnosis - mechanical heart valve Procedure code if [id number]

## 2014-01-16 DIAGNOSIS — D631 Anemia in chronic kidney disease: Secondary | ICD-10-CM | POA: Diagnosis not present

## 2014-01-16 DIAGNOSIS — N189 Chronic kidney disease, unspecified: Secondary | ICD-10-CM | POA: Diagnosis not present

## 2014-01-17 LAB — POCT INR: INR: 2.9

## 2014-01-18 ENCOUNTER — Ambulatory Visit (INDEPENDENT_AMBULATORY_CARE_PROVIDER_SITE_OTHER): Payer: Medicare Other | Admitting: Pharmacist

## 2014-01-18 DIAGNOSIS — Z952 Presence of prosthetic heart valve: Secondary | ICD-10-CM

## 2014-01-18 DIAGNOSIS — Z5181 Encounter for therapeutic drug level monitoring: Secondary | ICD-10-CM

## 2014-01-18 DIAGNOSIS — Z954 Presence of other heart-valve replacement: Secondary | ICD-10-CM

## 2014-01-18 DIAGNOSIS — Z7901 Long term (current) use of anticoagulants: Secondary | ICD-10-CM

## 2014-01-18 NOTE — Progress Notes (Signed)
No charge for labs or visit - INR check through home monitoring system  

## 2014-01-30 DIAGNOSIS — N183 Chronic kidney disease, stage 3 (moderate): Secondary | ICD-10-CM | POA: Diagnosis not present

## 2014-01-30 DIAGNOSIS — I10 Essential (primary) hypertension: Secondary | ICD-10-CM | POA: Diagnosis not present

## 2014-01-30 DIAGNOSIS — I2510 Atherosclerotic heart disease of native coronary artery without angina pectoris: Secondary | ICD-10-CM | POA: Diagnosis not present

## 2014-01-30 DIAGNOSIS — K4090 Unilateral inguinal hernia, without obstruction or gangrene, not specified as recurrent: Secondary | ICD-10-CM | POA: Diagnosis not present

## 2014-01-30 DIAGNOSIS — M79641 Pain in right hand: Secondary | ICD-10-CM | POA: Diagnosis not present

## 2014-01-30 DIAGNOSIS — K432 Incisional hernia without obstruction or gangrene: Secondary | ICD-10-CM | POA: Insufficient documentation

## 2014-01-30 DIAGNOSIS — K439 Ventral hernia without obstruction or gangrene: Secondary | ICD-10-CM | POA: Diagnosis not present

## 2014-01-30 DIAGNOSIS — D631 Anemia in chronic kidney disease: Secondary | ICD-10-CM | POA: Diagnosis not present

## 2014-01-30 DIAGNOSIS — R200 Anesthesia of skin: Secondary | ICD-10-CM | POA: Diagnosis not present

## 2014-01-30 DIAGNOSIS — M25511 Pain in right shoulder: Secondary | ICD-10-CM | POA: Diagnosis not present

## 2014-01-30 DIAGNOSIS — D8989 Other specified disorders involving the immune mechanism, not elsewhere classified: Secondary | ICD-10-CM | POA: Diagnosis not present

## 2014-01-30 DIAGNOSIS — D899 Disorder involving the immune mechanism, unspecified: Secondary | ICD-10-CM | POA: Diagnosis not present

## 2014-01-30 DIAGNOSIS — R1013 Epigastric pain: Secondary | ICD-10-CM | POA: Diagnosis not present

## 2014-01-30 DIAGNOSIS — D649 Anemia, unspecified: Secondary | ICD-10-CM | POA: Diagnosis not present

## 2014-01-30 DIAGNOSIS — Z940 Kidney transplant status: Secondary | ICD-10-CM | POA: Diagnosis not present

## 2014-01-30 DIAGNOSIS — Z5181 Encounter for therapeutic drug level monitoring: Secondary | ICD-10-CM | POA: Diagnosis not present

## 2014-01-30 DIAGNOSIS — I120 Hypertensive chronic kidney disease with stage 5 chronic kidney disease or end stage renal disease: Secondary | ICD-10-CM | POA: Diagnosis not present

## 2014-01-31 DIAGNOSIS — E872 Acidosis: Secondary | ICD-10-CM | POA: Diagnosis not present

## 2014-01-31 DIAGNOSIS — Z940 Kidney transplant status: Secondary | ICD-10-CM | POA: Diagnosis not present

## 2014-01-31 DIAGNOSIS — I10 Essential (primary) hypertension: Secondary | ICD-10-CM | POA: Diagnosis not present

## 2014-01-31 DIAGNOSIS — D649 Anemia, unspecified: Secondary | ICD-10-CM | POA: Diagnosis not present

## 2014-01-31 DIAGNOSIS — I2510 Atherosclerotic heart disease of native coronary artery without angina pectoris: Secondary | ICD-10-CM | POA: Diagnosis not present

## 2014-02-01 ENCOUNTER — Ambulatory Visit (INDEPENDENT_AMBULATORY_CARE_PROVIDER_SITE_OTHER): Payer: Medicare Other | Admitting: Pharmacist

## 2014-02-01 DIAGNOSIS — Z5181 Encounter for therapeutic drug level monitoring: Secondary | ICD-10-CM

## 2014-02-01 DIAGNOSIS — Z7901 Long term (current) use of anticoagulants: Secondary | ICD-10-CM

## 2014-02-01 DIAGNOSIS — Z952 Presence of prosthetic heart valve: Secondary | ICD-10-CM

## 2014-02-01 DIAGNOSIS — Z954 Presence of other heart-valve replacement: Secondary | ICD-10-CM

## 2014-02-01 LAB — POCT INR: INR: 2.5

## 2014-02-07 DIAGNOSIS — Z954 Presence of other heart-valve replacement: Secondary | ICD-10-CM | POA: Diagnosis not present

## 2014-02-08 DIAGNOSIS — N508 Other specified disorders of male genital organs: Secondary | ICD-10-CM | POA: Diagnosis not present

## 2014-02-08 DIAGNOSIS — K439 Ventral hernia without obstruction or gangrene: Secondary | ICD-10-CM | POA: Insufficient documentation

## 2014-02-08 DIAGNOSIS — D899 Disorder involving the immune mechanism, unspecified: Secondary | ICD-10-CM | POA: Diagnosis not present

## 2014-02-08 DIAGNOSIS — Z940 Kidney transplant status: Secondary | ICD-10-CM | POA: Diagnosis not present

## 2014-02-21 DIAGNOSIS — N433 Hydrocele, unspecified: Secondary | ICD-10-CM | POA: Diagnosis not present

## 2014-02-21 DIAGNOSIS — I861 Scrotal varices: Secondary | ICD-10-CM | POA: Diagnosis not present

## 2014-02-22 ENCOUNTER — Ambulatory Visit (INDEPENDENT_AMBULATORY_CARE_PROVIDER_SITE_OTHER): Payer: Medicare Other | Admitting: Pharmacist

## 2014-02-22 DIAGNOSIS — Z954 Presence of other heart-valve replacement: Secondary | ICD-10-CM

## 2014-02-22 DIAGNOSIS — Z7901 Long term (current) use of anticoagulants: Secondary | ICD-10-CM

## 2014-02-22 DIAGNOSIS — Z952 Presence of prosthetic heart valve: Secondary | ICD-10-CM

## 2014-02-22 DIAGNOSIS — Z5181 Encounter for therapeutic drug level monitoring: Secondary | ICD-10-CM

## 2014-02-22 LAB — POCT INR: INR: 3

## 2014-02-22 NOTE — Progress Notes (Signed)
No charge for labs or visit - INR check through home monitoring system  

## 2014-02-24 DIAGNOSIS — N183 Chronic kidney disease, stage 3 (moderate): Secondary | ICD-10-CM | POA: Diagnosis not present

## 2014-02-24 DIAGNOSIS — D631 Anemia in chronic kidney disease: Secondary | ICD-10-CM | POA: Diagnosis not present

## 2014-03-07 DIAGNOSIS — Z954 Presence of other heart-valve replacement: Secondary | ICD-10-CM | POA: Diagnosis not present

## 2014-03-13 ENCOUNTER — Ambulatory Visit (INDEPENDENT_AMBULATORY_CARE_PROVIDER_SITE_OTHER): Payer: Medicare Other | Admitting: Pharmacist

## 2014-03-13 DIAGNOSIS — Z952 Presence of prosthetic heart valve: Secondary | ICD-10-CM

## 2014-03-13 DIAGNOSIS — Z954 Presence of other heart-valve replacement: Secondary | ICD-10-CM

## 2014-03-13 DIAGNOSIS — Z7901 Long term (current) use of anticoagulants: Secondary | ICD-10-CM

## 2014-03-13 DIAGNOSIS — Z5181 Encounter for therapeutic drug level monitoring: Secondary | ICD-10-CM

## 2014-03-13 LAB — POCT INR: INR: 2.1

## 2014-03-13 NOTE — Progress Notes (Signed)
No charge for labs or visit - INR check through home monitoring system  

## 2014-03-14 DIAGNOSIS — K439 Ventral hernia without obstruction or gangrene: Secondary | ICD-10-CM | POA: Diagnosis not present

## 2014-03-14 DIAGNOSIS — I861 Scrotal varices: Secondary | ICD-10-CM | POA: Diagnosis not present

## 2014-03-14 DIAGNOSIS — N433 Hydrocele, unspecified: Secondary | ICD-10-CM | POA: Diagnosis not present

## 2014-03-14 DIAGNOSIS — N528 Other male erectile dysfunction: Secondary | ICD-10-CM | POA: Diagnosis not present

## 2014-03-14 DIAGNOSIS — Z940 Kidney transplant status: Secondary | ICD-10-CM | POA: Diagnosis not present

## 2014-03-14 DIAGNOSIS — D899 Disorder involving the immune mechanism, unspecified: Secondary | ICD-10-CM | POA: Diagnosis not present

## 2014-03-17 DIAGNOSIS — N183 Chronic kidney disease, stage 3 (moderate): Secondary | ICD-10-CM | POA: Diagnosis not present

## 2014-03-17 DIAGNOSIS — D631 Anemia in chronic kidney disease: Secondary | ICD-10-CM | POA: Diagnosis not present

## 2014-03-17 DIAGNOSIS — Z940 Kidney transplant status: Secondary | ICD-10-CM | POA: Diagnosis not present

## 2014-03-18 ENCOUNTER — Other Ambulatory Visit: Payer: Self-pay | Admitting: Pharmacist

## 2014-03-20 NOTE — Telephone Encounter (Signed)
Suppose to have protime rck today but no appt scheduled.

## 2014-03-21 LAB — POCT INR: INR: 2.5

## 2014-03-22 ENCOUNTER — Ambulatory Visit (INDEPENDENT_AMBULATORY_CARE_PROVIDER_SITE_OTHER): Payer: Medicare Other | Admitting: Pharmacist

## 2014-03-22 DIAGNOSIS — Z952 Presence of prosthetic heart valve: Secondary | ICD-10-CM

## 2014-03-22 DIAGNOSIS — Z954 Presence of other heart-valve replacement: Secondary | ICD-10-CM

## 2014-03-22 DIAGNOSIS — Z5181 Encounter for therapeutic drug level monitoring: Secondary | ICD-10-CM

## 2014-03-22 DIAGNOSIS — Z7901 Long term (current) use of anticoagulants: Secondary | ICD-10-CM

## 2014-03-22 NOTE — Progress Notes (Signed)
No charge for labs or visit - INR check through home monitoring system  

## 2014-03-27 ENCOUNTER — Ambulatory Visit (INDEPENDENT_AMBULATORY_CARE_PROVIDER_SITE_OTHER): Payer: Medicare Other | Admitting: Pharmacist

## 2014-03-27 DIAGNOSIS — Z7901 Long term (current) use of anticoagulants: Secondary | ICD-10-CM

## 2014-03-27 DIAGNOSIS — Z5181 Encounter for therapeutic drug level monitoring: Secondary | ICD-10-CM | POA: Diagnosis not present

## 2014-03-27 DIAGNOSIS — Z952 Presence of prosthetic heart valve: Secondary | ICD-10-CM

## 2014-03-27 DIAGNOSIS — I359 Nonrheumatic aortic valve disorder, unspecified: Secondary | ICD-10-CM | POA: Diagnosis not present

## 2014-03-27 DIAGNOSIS — Z954 Presence of other heart-valve replacement: Secondary | ICD-10-CM | POA: Diagnosis not present

## 2014-03-27 LAB — POCT INR: INR: 2.2

## 2014-03-28 DIAGNOSIS — E559 Vitamin D deficiency, unspecified: Secondary | ICD-10-CM | POA: Diagnosis not present

## 2014-03-28 DIAGNOSIS — N2581 Secondary hyperparathyroidism of renal origin: Secondary | ICD-10-CM | POA: Diagnosis not present

## 2014-03-28 DIAGNOSIS — D899 Disorder involving the immune mechanism, unspecified: Secondary | ICD-10-CM | POA: Diagnosis not present

## 2014-03-28 DIAGNOSIS — Z940 Kidney transplant status: Secondary | ICD-10-CM | POA: Diagnosis not present

## 2014-03-28 DIAGNOSIS — N183 Chronic kidney disease, stage 3 (moderate): Secondary | ICD-10-CM | POA: Diagnosis not present

## 2014-03-28 DIAGNOSIS — I129 Hypertensive chronic kidney disease with stage 1 through stage 4 chronic kidney disease, or unspecified chronic kidney disease: Secondary | ICD-10-CM | POA: Diagnosis not present

## 2014-04-03 DIAGNOSIS — L282 Other prurigo: Secondary | ICD-10-CM | POA: Diagnosis not present

## 2014-04-03 DIAGNOSIS — N183 Chronic kidney disease, stage 3 (moderate): Secondary | ICD-10-CM | POA: Diagnosis not present

## 2014-04-03 DIAGNOSIS — D631 Anemia in chronic kidney disease: Secondary | ICD-10-CM | POA: Diagnosis not present

## 2014-04-03 DIAGNOSIS — L570 Actinic keratosis: Secondary | ICD-10-CM | POA: Diagnosis not present

## 2014-04-04 DIAGNOSIS — Z954 Presence of other heart-valve replacement: Secondary | ICD-10-CM | POA: Diagnosis not present

## 2014-04-12 DIAGNOSIS — I2510 Atherosclerotic heart disease of native coronary artery without angina pectoris: Secondary | ICD-10-CM | POA: Diagnosis not present

## 2014-04-12 DIAGNOSIS — Z7901 Long term (current) use of anticoagulants: Secondary | ICD-10-CM | POA: Diagnosis not present

## 2014-04-12 DIAGNOSIS — Z85528 Personal history of other malignant neoplasm of kidney: Secondary | ICD-10-CM | POA: Diagnosis not present

## 2014-04-12 DIAGNOSIS — I350 Nonrheumatic aortic (valve) stenosis: Secondary | ICD-10-CM | POA: Diagnosis not present

## 2014-04-12 DIAGNOSIS — Z940 Kidney transplant status: Secondary | ICD-10-CM | POA: Diagnosis not present

## 2014-04-12 DIAGNOSIS — Z952 Presence of prosthetic heart valve: Secondary | ICD-10-CM | POA: Diagnosis not present

## 2014-04-12 DIAGNOSIS — I120 Hypertensive chronic kidney disease with stage 5 chronic kidney disease or end stage renal disease: Secondary | ICD-10-CM | POA: Diagnosis not present

## 2014-04-12 DIAGNOSIS — N186 End stage renal disease: Secondary | ICD-10-CM | POA: Diagnosis not present

## 2014-04-18 DIAGNOSIS — N183 Chronic kidney disease, stage 3 (moderate): Secondary | ICD-10-CM | POA: Diagnosis not present

## 2014-04-19 ENCOUNTER — Ambulatory Visit (INDEPENDENT_AMBULATORY_CARE_PROVIDER_SITE_OTHER): Payer: Medicare Other | Admitting: Pharmacist

## 2014-04-19 DIAGNOSIS — Z952 Presence of prosthetic heart valve: Secondary | ICD-10-CM

## 2014-04-19 DIAGNOSIS — Z954 Presence of other heart-valve replacement: Secondary | ICD-10-CM

## 2014-04-19 DIAGNOSIS — Z5181 Encounter for therapeutic drug level monitoring: Secondary | ICD-10-CM

## 2014-04-19 DIAGNOSIS — Z7901 Long term (current) use of anticoagulants: Secondary | ICD-10-CM

## 2014-04-19 LAB — POCT INR: INR: 1.9

## 2014-04-19 NOTE — Progress Notes (Signed)
Patient checks INR at home with Home INR monitoring.  Billing once per month interupertation fee.  Patient diagnosis - MVR - mechanical heart value (aortic) Procedure code if [id number]

## 2014-04-26 ENCOUNTER — Ambulatory Visit (INDEPENDENT_AMBULATORY_CARE_PROVIDER_SITE_OTHER): Payer: Medicare Other | Admitting: Pharmacist

## 2014-04-26 DIAGNOSIS — Z5181 Encounter for therapeutic drug level monitoring: Secondary | ICD-10-CM

## 2014-04-26 DIAGNOSIS — Z952 Presence of prosthetic heart valve: Secondary | ICD-10-CM

## 2014-04-26 DIAGNOSIS — Z954 Presence of other heart-valve replacement: Secondary | ICD-10-CM

## 2014-04-26 DIAGNOSIS — Z7901 Long term (current) use of anticoagulants: Secondary | ICD-10-CM

## 2014-04-26 LAB — POCT INR: INR: 3

## 2014-05-03 DIAGNOSIS — Z954 Presence of other heart-valve replacement: Secondary | ICD-10-CM | POA: Diagnosis not present

## 2014-05-03 LAB — POCT INR: INR: 2.1

## 2014-05-04 ENCOUNTER — Ambulatory Visit (INDEPENDENT_AMBULATORY_CARE_PROVIDER_SITE_OTHER): Payer: Medicare Other | Admitting: Pharmacist

## 2014-05-04 DIAGNOSIS — Z952 Presence of prosthetic heart valve: Secondary | ICD-10-CM

## 2014-05-04 DIAGNOSIS — Z5181 Encounter for therapeutic drug level monitoring: Secondary | ICD-10-CM

## 2014-05-04 DIAGNOSIS — Z954 Presence of other heart-valve replacement: Secondary | ICD-10-CM

## 2014-05-04 DIAGNOSIS — Z7901 Long term (current) use of anticoagulants: Secondary | ICD-10-CM

## 2014-05-05 DIAGNOSIS — Z940 Kidney transplant status: Secondary | ICD-10-CM | POA: Diagnosis not present

## 2014-05-05 DIAGNOSIS — N183 Chronic kidney disease, stage 3 (moderate): Secondary | ICD-10-CM | POA: Diagnosis not present

## 2014-05-09 DIAGNOSIS — Z7901 Long term (current) use of anticoagulants: Secondary | ICD-10-CM | POA: Diagnosis not present

## 2014-05-09 DIAGNOSIS — Z79899 Other long term (current) drug therapy: Secondary | ICD-10-CM | POA: Diagnosis not present

## 2014-05-09 DIAGNOSIS — C642 Malignant neoplasm of left kidney, except renal pelvis: Secondary | ICD-10-CM | POA: Diagnosis not present

## 2014-05-09 DIAGNOSIS — D649 Anemia, unspecified: Secondary | ICD-10-CM | POA: Diagnosis not present

## 2014-05-09 DIAGNOSIS — Z940 Kidney transplant status: Secondary | ICD-10-CM | POA: Diagnosis not present

## 2014-05-09 DIAGNOSIS — Z9889 Other specified postprocedural states: Secondary | ICD-10-CM | POA: Diagnosis not present

## 2014-05-09 DIAGNOSIS — C641 Malignant neoplasm of right kidney, except renal pelvis: Secondary | ICD-10-CM | POA: Diagnosis not present

## 2014-05-09 DIAGNOSIS — Z85528 Personal history of other malignant neoplasm of kidney: Secondary | ICD-10-CM | POA: Diagnosis not present

## 2014-05-09 DIAGNOSIS — Z7982 Long term (current) use of aspirin: Secondary | ICD-10-CM | POA: Diagnosis not present

## 2014-05-09 DIAGNOSIS — C649 Malignant neoplasm of unspecified kidney, except renal pelvis: Secondary | ICD-10-CM | POA: Insufficient documentation

## 2014-05-10 ENCOUNTER — Ambulatory Visit (INDEPENDENT_AMBULATORY_CARE_PROVIDER_SITE_OTHER): Payer: Medicare Other | Admitting: Pharmacist

## 2014-05-10 DIAGNOSIS — Z952 Presence of prosthetic heart valve: Secondary | ICD-10-CM

## 2014-05-10 DIAGNOSIS — Z7901 Long term (current) use of anticoagulants: Secondary | ICD-10-CM

## 2014-05-10 DIAGNOSIS — Z954 Presence of other heart-valve replacement: Secondary | ICD-10-CM

## 2014-05-10 DIAGNOSIS — Z5181 Encounter for therapeutic drug level monitoring: Secondary | ICD-10-CM

## 2014-05-10 LAB — POCT INR: INR: 2.4

## 2014-05-10 NOTE — Progress Notes (Signed)
Patient checks INR at home with Home INR monitoring.  Billing once per month interupertation fee.  Patient diagnosis - history of AVR on chronic anticoagulation Procedure code if G0250

## 2014-05-18 ENCOUNTER — Ambulatory Visit (INDEPENDENT_AMBULATORY_CARE_PROVIDER_SITE_OTHER): Payer: Medicare Other | Admitting: Pharmacist

## 2014-05-18 DIAGNOSIS — Z952 Presence of prosthetic heart valve: Secondary | ICD-10-CM

## 2014-05-18 DIAGNOSIS — Z954 Presence of other heart-valve replacement: Secondary | ICD-10-CM

## 2014-05-18 DIAGNOSIS — Z7901 Long term (current) use of anticoagulants: Secondary | ICD-10-CM

## 2014-05-18 DIAGNOSIS — Z5181 Encounter for therapeutic drug level monitoring: Secondary | ICD-10-CM

## 2014-05-18 LAB — POCT INR: INR: 2.7

## 2014-05-18 NOTE — Progress Notes (Signed)
No charge for labs or visit - INR check through home monitoring system  

## 2014-05-19 DIAGNOSIS — D631 Anemia in chronic kidney disease: Secondary | ICD-10-CM | POA: Diagnosis not present

## 2014-05-19 DIAGNOSIS — N183 Chronic kidney disease, stage 3 (moderate): Secondary | ICD-10-CM | POA: Diagnosis not present

## 2014-05-19 DIAGNOSIS — Z79899 Other long term (current) drug therapy: Secondary | ICD-10-CM | POA: Diagnosis not present

## 2014-05-26 ENCOUNTER — Ambulatory Visit (INDEPENDENT_AMBULATORY_CARE_PROVIDER_SITE_OTHER): Payer: Medicare Other | Admitting: Pharmacist

## 2014-05-26 DIAGNOSIS — Z952 Presence of prosthetic heart valve: Secondary | ICD-10-CM

## 2014-05-26 DIAGNOSIS — Z7901 Long term (current) use of anticoagulants: Secondary | ICD-10-CM

## 2014-05-26 DIAGNOSIS — Z5181 Encounter for therapeutic drug level monitoring: Secondary | ICD-10-CM

## 2014-05-26 DIAGNOSIS — Z954 Presence of other heart-valve replacement: Secondary | ICD-10-CM

## 2014-05-26 LAB — POCT INR: INR: 2.2

## 2014-05-26 NOTE — Progress Notes (Signed)
No charge for labs or visit - INR check through home monitoring system  

## 2014-06-01 DIAGNOSIS — Z954 Presence of other heart-valve replacement: Secondary | ICD-10-CM | POA: Diagnosis not present

## 2014-06-02 DIAGNOSIS — D631 Anemia in chronic kidney disease: Secondary | ICD-10-CM | POA: Diagnosis not present

## 2014-06-02 DIAGNOSIS — N183 Chronic kidney disease, stage 3 (moderate): Secondary | ICD-10-CM | POA: Diagnosis not present

## 2014-06-09 LAB — POCT INR: INR: 2.5

## 2014-06-12 ENCOUNTER — Ambulatory Visit (INDEPENDENT_AMBULATORY_CARE_PROVIDER_SITE_OTHER): Payer: Medicare Other | Admitting: Pharmacist

## 2014-06-12 DIAGNOSIS — Z954 Presence of other heart-valve replacement: Secondary | ICD-10-CM | POA: Diagnosis not present

## 2014-06-12 DIAGNOSIS — Z5181 Encounter for therapeutic drug level monitoring: Secondary | ICD-10-CM

## 2014-06-12 DIAGNOSIS — Z7901 Long term (current) use of anticoagulants: Secondary | ICD-10-CM | POA: Diagnosis not present

## 2014-06-12 DIAGNOSIS — Z952 Presence of prosthetic heart valve: Secondary | ICD-10-CM

## 2014-06-12 NOTE — Progress Notes (Signed)
Patient checks INR at home with Home INR monitoring.  Billing once per month interupertation fee.  Patient diagnosis  - MVR, long term anticoag use. Procedure code if G0250  Also left patient message that follow up with PCP needed.

## 2014-06-13 DIAGNOSIS — N2581 Secondary hyperparathyroidism of renal origin: Secondary | ICD-10-CM | POA: Diagnosis not present

## 2014-06-13 DIAGNOSIS — E872 Acidosis: Secondary | ICD-10-CM | POA: Diagnosis not present

## 2014-06-13 DIAGNOSIS — Z5189 Encounter for other specified aftercare: Secondary | ICD-10-CM | POA: Diagnosis not present

## 2014-06-13 DIAGNOSIS — Z5181 Encounter for therapeutic drug level monitoring: Secondary | ICD-10-CM | POA: Diagnosis not present

## 2014-06-13 DIAGNOSIS — E59 Dietary selenium deficiency: Secondary | ICD-10-CM | POA: Diagnosis not present

## 2014-06-13 DIAGNOSIS — Z79899 Other long term (current) drug therapy: Secondary | ICD-10-CM | POA: Diagnosis not present

## 2014-06-13 DIAGNOSIS — I10 Essential (primary) hypertension: Secondary | ICD-10-CM | POA: Diagnosis not present

## 2014-06-13 DIAGNOSIS — Z940 Kidney transplant status: Secondary | ICD-10-CM | POA: Diagnosis not present

## 2014-06-13 DIAGNOSIS — D899 Disorder involving the immune mechanism, unspecified: Secondary | ICD-10-CM | POA: Diagnosis not present

## 2014-06-13 DIAGNOSIS — N183 Chronic kidney disease, stage 3 (moderate): Secondary | ICD-10-CM | POA: Diagnosis not present

## 2014-06-16 DIAGNOSIS — Z79899 Other long term (current) drug therapy: Secondary | ICD-10-CM | POA: Diagnosis not present

## 2014-06-16 DIAGNOSIS — D631 Anemia in chronic kidney disease: Secondary | ICD-10-CM | POA: Diagnosis not present

## 2014-06-16 DIAGNOSIS — N183 Chronic kidney disease, stage 3 (moderate): Secondary | ICD-10-CM | POA: Diagnosis not present

## 2014-06-16 LAB — POCT INR: INR: 2.1

## 2014-06-19 ENCOUNTER — Ambulatory Visit (INDEPENDENT_AMBULATORY_CARE_PROVIDER_SITE_OTHER): Payer: Medicare Other | Admitting: Pharmacist

## 2014-06-19 DIAGNOSIS — Z5181 Encounter for therapeutic drug level monitoring: Secondary | ICD-10-CM

## 2014-06-19 DIAGNOSIS — Z954 Presence of other heart-valve replacement: Secondary | ICD-10-CM

## 2014-06-19 DIAGNOSIS — Z952 Presence of prosthetic heart valve: Secondary | ICD-10-CM

## 2014-06-19 DIAGNOSIS — Z7901 Long term (current) use of anticoagulants: Secondary | ICD-10-CM

## 2014-06-25 LAB — POCT INR: INR: 2.1

## 2014-06-26 ENCOUNTER — Ambulatory Visit (INDEPENDENT_AMBULATORY_CARE_PROVIDER_SITE_OTHER): Payer: Medicare Other | Admitting: Pharmacist

## 2014-06-26 ENCOUNTER — Other Ambulatory Visit: Payer: Self-pay | Admitting: Pharmacist

## 2014-06-26 DIAGNOSIS — Z7901 Long term (current) use of anticoagulants: Secondary | ICD-10-CM

## 2014-06-26 DIAGNOSIS — Z5181 Encounter for therapeutic drug level monitoring: Secondary | ICD-10-CM

## 2014-06-26 DIAGNOSIS — Z954 Presence of other heart-valve replacement: Secondary | ICD-10-CM

## 2014-06-26 DIAGNOSIS — Z952 Presence of prosthetic heart valve: Secondary | ICD-10-CM

## 2014-06-26 NOTE — Progress Notes (Signed)
No charge for labs or visit - INR check through home monitoring system  

## 2014-07-03 ENCOUNTER — Ambulatory Visit (INDEPENDENT_AMBULATORY_CARE_PROVIDER_SITE_OTHER): Payer: Medicare Other | Admitting: Pharmacist

## 2014-07-03 ENCOUNTER — Telehealth: Payer: Self-pay | Admitting: *Deleted

## 2014-07-03 DIAGNOSIS — Z954 Presence of other heart-valve replacement: Secondary | ICD-10-CM | POA: Diagnosis not present

## 2014-07-03 DIAGNOSIS — Z952 Presence of prosthetic heart valve: Secondary | ICD-10-CM

## 2014-07-03 DIAGNOSIS — Z5181 Encounter for therapeutic drug level monitoring: Secondary | ICD-10-CM

## 2014-07-03 DIAGNOSIS — Z7901 Long term (current) use of anticoagulants: Secondary | ICD-10-CM

## 2014-07-03 LAB — POCT INR: INR: 1.8

## 2014-07-03 NOTE — Progress Notes (Signed)
No charge for labs or visit - INR check through home monitoring system  

## 2014-07-03 NOTE — Telephone Encounter (Signed)
INR result 1.8.

## 2014-07-03 NOTE — Telephone Encounter (Signed)
Patient called with warfarin adjustment - see anticoagulation notes.

## 2014-07-10 ENCOUNTER — Ambulatory Visit: Payer: Medicare Other | Admitting: Family Medicine

## 2014-07-10 ENCOUNTER — Ambulatory Visit (INDEPENDENT_AMBULATORY_CARE_PROVIDER_SITE_OTHER): Payer: Medicare Other | Admitting: Pharmacist

## 2014-07-10 DIAGNOSIS — Z954 Presence of other heart-valve replacement: Secondary | ICD-10-CM

## 2014-07-10 DIAGNOSIS — Z5181 Encounter for therapeutic drug level monitoring: Secondary | ICD-10-CM | POA: Diagnosis not present

## 2014-07-10 DIAGNOSIS — Z7901 Long term (current) use of anticoagulants: Secondary | ICD-10-CM | POA: Diagnosis not present

## 2014-07-10 DIAGNOSIS — Z952 Presence of prosthetic heart valve: Secondary | ICD-10-CM

## 2014-07-10 LAB — POCT INR: INR: 2

## 2014-07-10 NOTE — Progress Notes (Signed)
Patient checks INR at home with Home INR monitoring.  Billing once per month interupertation fee.  Patient diagnosis - I 35.9 Aortic valve disorder  Procedure code if G0250

## 2014-07-13 DIAGNOSIS — N183 Chronic kidney disease, stage 3 (moderate): Secondary | ICD-10-CM | POA: Diagnosis not present

## 2014-07-13 DIAGNOSIS — C641 Malignant neoplasm of right kidney, except renal pelvis: Secondary | ICD-10-CM | POA: Diagnosis not present

## 2014-07-13 DIAGNOSIS — E041 Nontoxic single thyroid nodule: Secondary | ICD-10-CM | POA: Diagnosis not present

## 2014-07-13 DIAGNOSIS — D631 Anemia in chronic kidney disease: Secondary | ICD-10-CM | POA: Diagnosis not present

## 2014-07-17 ENCOUNTER — Ambulatory Visit (INDEPENDENT_AMBULATORY_CARE_PROVIDER_SITE_OTHER): Payer: 59 | Admitting: Pharmacist

## 2014-07-17 DIAGNOSIS — Z5181 Encounter for therapeutic drug level monitoring: Secondary | ICD-10-CM

## 2014-07-17 DIAGNOSIS — Z952 Presence of prosthetic heart valve: Secondary | ICD-10-CM

## 2014-07-17 DIAGNOSIS — Z7901 Long term (current) use of anticoagulants: Secondary | ICD-10-CM | POA: Diagnosis not present

## 2014-07-17 DIAGNOSIS — Z954 Presence of other heart-valve replacement: Secondary | ICD-10-CM | POA: Diagnosis not present

## 2014-07-17 LAB — POCT INR: INR: 2.4

## 2014-07-17 NOTE — Progress Notes (Signed)
Patient checks INR at home with Home INR monitoring.  Billing once per month interupertation fee.  Patient diagnosis - aortic heart valve replacement - tissue Procedure code if G0250

## 2014-07-24 ENCOUNTER — Ambulatory Visit (INDEPENDENT_AMBULATORY_CARE_PROVIDER_SITE_OTHER): Payer: 59 | Admitting: Pharmacist

## 2014-07-24 DIAGNOSIS — Z7901 Long term (current) use of anticoagulants: Secondary | ICD-10-CM

## 2014-07-24 DIAGNOSIS — Z952 Presence of prosthetic heart valve: Secondary | ICD-10-CM

## 2014-07-24 DIAGNOSIS — Z954 Presence of other heart-valve replacement: Secondary | ICD-10-CM

## 2014-07-24 DIAGNOSIS — Z5181 Encounter for therapeutic drug level monitoring: Secondary | ICD-10-CM

## 2014-07-24 LAB — POCT INR: INR: 2.1

## 2014-07-24 NOTE — Progress Notes (Signed)
No charge for labs or visit - INR check through home monitoring system  

## 2014-07-31 DIAGNOSIS — N183 Chronic kidney disease, stage 3 (moderate): Secondary | ICD-10-CM | POA: Diagnosis not present

## 2014-07-31 DIAGNOSIS — D631 Anemia in chronic kidney disease: Secondary | ICD-10-CM | POA: Diagnosis not present

## 2014-08-01 ENCOUNTER — Telehealth: Payer: Self-pay | Admitting: Pharmacist Clinician (PhC)/ Clinical Pharmacy Specialist

## 2014-08-01 DIAGNOSIS — Z954 Presence of other heart-valve replacement: Secondary | ICD-10-CM | POA: Diagnosis not present

## 2014-08-01 LAB — POCT INR: INR: 1.8

## 2014-08-01 NOTE — Telephone Encounter (Signed)
Called patients with INR result of 1.8,  Instructed patient to take an extra 1/2 tablet today and then resume regular schedule tomorrow. Asked patient to call if he had any medication changes or forgot to take a dose of warfarin.  Will re-check in 1 week.

## 2014-08-07 ENCOUNTER — Encounter: Payer: Self-pay | Admitting: Family Medicine

## 2014-08-07 ENCOUNTER — Ambulatory Visit (INDEPENDENT_AMBULATORY_CARE_PROVIDER_SITE_OTHER): Payer: Medicare Other | Admitting: Family Medicine

## 2014-08-07 VITALS — BP 108/49 | HR 67 | Temp 98.1°F | Ht 69.5 in | Wt 197.0 lb

## 2014-08-07 DIAGNOSIS — J329 Chronic sinusitis, unspecified: Secondary | ICD-10-CM

## 2014-08-07 DIAGNOSIS — R059 Cough, unspecified: Secondary | ICD-10-CM

## 2014-08-07 DIAGNOSIS — R05 Cough: Secondary | ICD-10-CM | POA: Diagnosis not present

## 2014-08-07 MED ORDER — AMOXICILLIN 500 MG PO CAPS: 500.0000 mg | ORAL_CAPSULE | Freq: Three times a day (TID) | ORAL | Status: DC

## 2014-08-07 NOTE — Patient Instructions (Signed)
Drink plenty of fluids Continue to take Mucinex twice daily for cough and congestion with a large glass of water Take Tylenol for aches pains and fever Take amoxicillin as directed Use nasal saline and nasal gel as directed

## 2014-08-07 NOTE — Progress Notes (Signed)
Subjective:    Patient ID: Jeremy [name], male    DOB: 12-10-1955, 59 y.o.   MRN: [id number]  HPI Patient here today for cough and congestion that started last week and is gradually getting worse. This patient has a history of multiple medical problems. He was on dialysis for chronic kidney disease. He's had a heart valve replaced. He has a history of a kidney transplant and is no longer on dialysis.. A recent history of congestion and drainage and cough. His eyes are also burning. Most of the congestion and his having his head related with a slight cough. He is taking Mucinex. He was inquiring about medication for irritable bowel syndrome diarrhea type and I told him he should discuss this with his gastroenterologist after he completes the course of antibiotic that we will prescribe during the visit today.        Patient Active Problem List   Diagnosis Date Noted  . Malignant neoplasm of kidney 03/17/2013  . ETD (eustachian tube dysfunction) 03/17/2013  . OSA (obstructive sleep apnea)   . Chronic kidney disease, stage III (moderate) 02/01/2013  . Ventral hernia 01/17/2013  . Disorder of magnesium metabolism 01/03/2013  . Acidosis 01/03/2013  . Encounter for long-term (current) use of other medications 12/15/2012  . History of kidney transplant 12/15/2012  . Heart valve replaced 09/07/2012  . Postsurgical percutaneous transluminal coronary angioplasty status 09/07/2012  . Low back pain 09/06/2012  . Thoracic or lumbosacral neuritis or radiculitis 09/06/2012  . Sacroiliitis 09/06/2012  . HTN (hypertension) 07/26/2012  . HLD (hyperlipidemia) 07/26/2012  . CKD (chronic kidney disease) stage V requiring chronic dialysis 07/26/2012  . Hyperlipidemia 07/26/2012  . S/P AVR (aortic valve replacement) 06/23/2012  . Anticoagulation goal of INR 2 to 3 06/23/2012  . Aortic valve disorder 06/14/2012  . Congestive heart failure 05/30/2012  . Nontoxic uninodular goiter 06/26/2011  . Coronary  atherosclerosis 05/26/2011  . Contusion of abdominal wall 05/26/2011  . Contusion 05/26/2011  . End-stage renal disease 05/20/2011  . Angina pectoris 05/19/2011  . Essential hypertension 05/19/2011  . Intermediate coronary syndrome 05/19/2011   Outpatient Encounter Prescriptions as of 08/07/2014  . Order #: [id number] Class: Historical Med  . Order #: [id number] Class: Historical Med  . Order #: [id number] Class: Historical Med  . Order #: [id number] Class: Historical Med  . Order #: [id number] Class: Normal  . Order #: [id number] Class: Historical Med  . Order #: [id number] Class: Historical Med  . Order #: [id number] Class: Historical Med  . Order #: [id number] Class: Historical Med  . Order #: [id number] Class: Historical Med  . Order #: [id number] Class: Historical Med  . Order #: [id number] Class: Historical Med  . Order #: [id number] Class: Historical Med  . Order #: [id number] Class: Historical Med  . Order #: [id number] Class: Historical Med  . Order #: [id number] Class: Historical Med  . Order #: [id number] Class: Historical Med  . Order #: [id number] Class: Historical Med  . Order #: [id number] Class: Normal  . [DISCONTINUED] Order #: [id number] Class: Historical Med  . Order #: [id number] Class: Normal  . [DISCONTINUED] Order #: [id number] Class: Historical Med  . [DISCONTINUED] Order #: [id number] Class: Historical Med  . [DISCONTINUED] Order #: [id number] Class: Normal    Review of Systems  Constitutional: Negative.   HENT: Positive for congestion and postnasal drip.   Eyes: Positive for pain (burning).  Respiratory: Positive for cough.   Cardiovascular: Negative.   Gastrointestinal: Negative.  Endocrine: Negative.   Genitourinary: Negative.   Musculoskeletal: Negative.   Skin: Negative.   Allergic/Immunologic: Negative.   Neurological: Positive for headaches (slight ).  Hematological: Negative.   Psychiatric/Behavioral: Negative.        Objective:   Physical Exam  Constitutional: He  is oriented to person, place, and time. He appears well-developed and well-nourished. No distress.  The patient is alert and pleasant  HENT:  Head: Normocephalic and atraumatic.  Right Ear: External ear normal.  Left Ear: External ear normal.  Mouth/Throat: Oropharynx is clear and moist. No oropharyngeal exudate.  There is slight ethmoid and maxillary sinus tenderness bilaterally the nose is congested with erythema  Eyes: Conjunctivae and EOM are normal. Pupils are equal, round, and reactive to light. Right eye exhibits no discharge. Left eye exhibits no discharge. No scleral icterus.  Neck: Normal range of motion. Neck supple. No thyromegaly present.  No anterior cervical nodes  Cardiovascular: Normal rate, regular rhythm and normal heart sounds.  Exam reveals no gallop and no friction rub.   No murmur heard. At 84/m  Pulmonary/Chest: Effort normal and breath sounds normal. No respiratory distress. He has no wheezes. He has no rales. He exhibits no tenderness.  The patient has a dry cough  Abdominal: Soft. Bowel sounds are normal. He exhibits no mass. There is no tenderness. There is no rebound and no guarding.  Abdomen was nontender without masses or bruits  Musculoskeletal: Normal range of motion. He exhibits no edema or tenderness.  Lymphadenopathy:    He has no cervical adenopathy.  Neurological: He is alert and oriented to person, place, and time.  Skin: Skin is warm and dry. No rash noted. No erythema. No pallor.  Psychiatric: He has a normal mood and affect. His behavior is normal. Judgment and thought content normal.  Nursing note and vitals reviewed.  BP 108/49 mmHg  Pulse 67  Temp(Src) 98.1 F (36.7 C) (Oral)  Ht 5' 9.5" (1.765 m)  Wt 197 lb (89.359 kg)  BMI 28.68 kg/m2        Assessment & Plan:  1. Cough -Continue to drink plenty of fluids, take Mucinex, and use nasal saline as directed - DG Chest 2 View; Future  2. Rhinosinusitis -Use nasal saline and nasal  gel - amoxicillin (AMOXIL) 500 MG capsule; Take 1 capsule (500 mg total) by mouth 3 (three) times daily.  Dispense: 30 capsule; Refill: 0  Patient Instructions  Drink plenty of fluids Continue to take Mucinex twice daily for cough and congestion with a large glass of water Take Tylenol for aches pains and fever Take amoxicillin as directed Use nasal saline and nasal gel as directed   Arrie Senate MD

## 2014-08-09 DIAGNOSIS — Z940 Kidney transplant status: Secondary | ICD-10-CM | POA: Diagnosis not present

## 2014-08-13 LAB — POCT INR: INR: 2.3

## 2014-08-14 ENCOUNTER — Ambulatory Visit (INDEPENDENT_AMBULATORY_CARE_PROVIDER_SITE_OTHER): Payer: 59 | Admitting: Pharmacist

## 2014-08-14 DIAGNOSIS — Z954 Presence of other heart-valve replacement: Secondary | ICD-10-CM | POA: Diagnosis not present

## 2014-08-14 DIAGNOSIS — Z952 Presence of prosthetic heart valve: Secondary | ICD-10-CM

## 2014-08-14 DIAGNOSIS — Z5181 Encounter for therapeutic drug level monitoring: Secondary | ICD-10-CM

## 2014-08-14 DIAGNOSIS — Z7901 Long term (current) use of anticoagulants: Secondary | ICD-10-CM

## 2014-08-14 NOTE — Progress Notes (Signed)
Patient checks INR at home with Home INR monitoring.  Billing once per month interupertation fee.  Patient diagnosis - prosthetic heart valve Procedure code if [id number]

## 2014-08-19 ENCOUNTER — Other Ambulatory Visit (INDEPENDENT_AMBULATORY_CARE_PROVIDER_SITE_OTHER): Payer: Self-pay | Admitting: Internal Medicine

## 2014-08-22 ENCOUNTER — Encounter: Payer: Self-pay | Admitting: Family Medicine

## 2014-08-22 LAB — POCT INR: INR: 3

## 2014-08-23 ENCOUNTER — Ambulatory Visit (INDEPENDENT_AMBULATORY_CARE_PROVIDER_SITE_OTHER): Payer: 59 | Admitting: Pharmacist

## 2014-08-23 DIAGNOSIS — Z7901 Long term (current) use of anticoagulants: Secondary | ICD-10-CM

## 2014-08-23 DIAGNOSIS — Z952 Presence of prosthetic heart valve: Secondary | ICD-10-CM

## 2014-08-23 DIAGNOSIS — Z5181 Encounter for therapeutic drug level monitoring: Secondary | ICD-10-CM

## 2014-08-23 DIAGNOSIS — Z954 Presence of other heart-valve replacement: Secondary | ICD-10-CM

## 2014-08-23 NOTE — Progress Notes (Signed)
No charge for labs or visit - INR check through home monitoring system  

## 2014-08-28 ENCOUNTER — Encounter: Payer: Self-pay | Admitting: Family Medicine

## 2014-08-28 ENCOUNTER — Ambulatory Visit (INDEPENDENT_AMBULATORY_CARE_PROVIDER_SITE_OTHER): Payer: Medicare Other | Admitting: Family Medicine

## 2014-08-28 ENCOUNTER — Ambulatory Visit (INDEPENDENT_AMBULATORY_CARE_PROVIDER_SITE_OTHER): Payer: 59 | Admitting: Pharmacist

## 2014-08-28 VITALS — BP 192/74 | HR 71 | Temp 97.9°F | Ht 69.5 in | Wt 196.0 lb

## 2014-08-28 DIAGNOSIS — R531 Weakness: Secondary | ICD-10-CM | POA: Diagnosis not present

## 2014-08-28 DIAGNOSIS — R791 Abnormal coagulation profile: Secondary | ICD-10-CM | POA: Diagnosis not present

## 2014-08-28 DIAGNOSIS — Z992 Dependence on renal dialysis: Secondary | ICD-10-CM

## 2014-08-28 DIAGNOSIS — Z954 Presence of other heart-valve replacement: Secondary | ICD-10-CM | POA: Diagnosis not present

## 2014-08-28 DIAGNOSIS — N183 Chronic kidney disease, stage 3 (moderate): Secondary | ICD-10-CM | POA: Diagnosis not present

## 2014-08-28 DIAGNOSIS — K625 Hemorrhage of anus and rectum: Secondary | ICD-10-CM

## 2014-08-28 DIAGNOSIS — Z7901 Long term (current) use of anticoagulants: Secondary | ICD-10-CM

## 2014-08-28 DIAGNOSIS — Z5181 Encounter for therapeutic drug level monitoring: Secondary | ICD-10-CM

## 2014-08-28 DIAGNOSIS — N186 End stage renal disease: Secondary | ICD-10-CM | POA: Diagnosis not present

## 2014-08-28 DIAGNOSIS — D631 Anemia in chronic kidney disease: Secondary | ICD-10-CM | POA: Diagnosis not present

## 2014-08-28 DIAGNOSIS — Z952 Presence of prosthetic heart valve: Secondary | ICD-10-CM

## 2014-08-28 LAB — POCT CBC
Granulocyte percent: 61.8 %G (ref 37–80)
HCT, POC: 33.3 % — AB (ref 43.5–53.7)
Hemoglobin: 10 g/dL — AB (ref 14.1–18.1)
Lymph, poc: 1 (ref 0.6–3.4)
MCH, POC: 26.2 pg — AB (ref 27–31.2)
MCHC: 29.9 g/dL — AB (ref 31.8–35.4)
MCV: 87.5 fL (ref 80–97)
MPV: 8.1 fL (ref 0–99.8)
POC Granulocyte: 2 (ref 2–6.9)
POC LYMPH %: 29.2 % (ref 10–50)
Platelet Count, POC: 127 10*3/uL — AB (ref 142–424)
RBC: 3.81 M/uL — AB (ref 4.69–6.13)
RDW, POC: 14.4 %
WBC: 3.3 10*3/uL — AB (ref 4.6–10.2)

## 2014-08-28 LAB — POCT INR: INR: 3.7

## 2014-08-28 NOTE — Progress Notes (Signed)
Subjective:    Patient ID: Jeremy [name], male    DOB: 1955-09-10, 59 y.o.   MRN: [id number]  HPI Patient here today for rectal bleeding and has an elevated INR. This patient has a history of multiple problems which include a malignant neoplasm of the kidney, with a history of a renal transplant. He also has hypertension chronic kidney disease and heart disease.      Patient Active Problem List   Diagnosis Date Noted  . Malignant neoplasm of kidney 03/17/2013  . ETD (eustachian tube dysfunction) 03/17/2013  . OSA (obstructive sleep apnea)   . Chronic kidney disease, stage III (moderate) 02/01/2013  . Ventral hernia 01/17/2013  . Disorder of magnesium metabolism 01/03/2013  . Acidosis 01/03/2013  . Encounter for long-term (current) use of other medications 12/15/2012  . History of kidney transplant 12/15/2012  . Heart valve replaced 09/07/2012  . Postsurgical percutaneous transluminal coronary angioplasty status 09/07/2012  . Low back pain 09/06/2012  . Thoracic or lumbosacral neuritis or radiculitis 09/06/2012  . Sacroiliitis 09/06/2012  . HTN (hypertension) 07/26/2012  . HLD (hyperlipidemia) 07/26/2012  . CKD (chronic kidney disease) stage V requiring chronic dialysis 07/26/2012  . Hyperlipidemia 07/26/2012  . S/P AVR (aortic valve replacement) 06/23/2012  . Anticoagulation goal of INR 2 to 3 06/23/2012  . Aortic valve disorder 06/14/2012  . Congestive heart failure 05/30/2012  . Nontoxic uninodular goiter 06/26/2011  . Coronary atherosclerosis 05/26/2011  . Contusion of abdominal wall 05/26/2011  . Contusion 05/26/2011  . End-stage renal disease 05/20/2011  . Angina pectoris 05/19/2011  . Essential hypertension 05/19/2011  . Intermediate coronary syndrome 05/19/2011   Outpatient Encounter Prescriptions as of 08/28/2014  Medication Sig  . Ascorbic Acid (VITAMIN C) 100 MG tablet Take 100 mg by mouth daily.  Marland Kitchen aspirin 81 MG tablet Take 81 mg by mouth daily.  . B Complex  Vitamins (VITAMIN-B COMPLEX) TABS Take 1 tablet by mouth.  . Biotin 1000 MCG tablet Take 1,000 mcg by mouth.  . cholecalciferol (VITAMIN D) 1000 UNITS tablet Take 1,000 Units by mouth daily.  Marland Kitchen Dextrose-Fructose-Sod Citrate [id number] MG CHEW Chew 1 tablet by mouth.  . dicyclomine (BENTYL) 10 MG capsule TAKE 1 CAPSULE BY MOUTH TWICE DAILY.  . folic acid-vitamin b complex-vitamin c-selenium-zinc (DIALYVITE) 3 MG TABS Take 1 tablet by mouth daily.    . furosemide (LASIX) 80 MG tablet Take 80 mg by mouth as needed.  . labetalol (NORMODYNE) 300 MG tablet Take 600 mg by mouth 3 (three) times daily.  Marland Kitchen loratadine (CLARITIN) 10 MG tablet Take 10 mg by mouth.  . magnesium oxide (MAG-OX) 400 MG tablet Take 400 mg by mouth daily.  . Multiple Vitamin (MULTI-VITAMINS) TABS Take by mouth.  . mycophenolate (MYFORTIC) 180 MG EC tablet Take 180 mg by mouth. Take 8 tablets daily  . NIFEdipine (PROCARDIA-XL/ADALAT CC) 60 MG 24 hr tablet Take 60 mg by mouth 2 (two) times daily.   . nitroGLYCERIN (NITROSTAT) 0.4 MG SL tablet Place 1 tablet (0.4 mg total) under the tongue every 5 (five) minutes as needed for chest pain.  . Omega-3 1000 MG CAPS Take 3 g by mouth.  Marland Kitchen omeprazole (PRILOSEC) 20 MG capsule Take 20 mg by mouth daily.  . pravastatin (PRAVACHOL) 40 MG tablet   . predniSONE (DELTASONE) 5 MG tablet Take 5 mg by mouth daily.  Marland Kitchen sulfamethoxazole-trimethoprim (BACTRIM,SEPTRA) 400-80 MG per tablet Take 1 tablet by mouth 3 (three) times a week.  . tacrolimus (PROGRAF) 1 MG capsule  Take 5 mg by mouth 2 (two) times daily. Take 2 q AM, 1 hs  . warfarin (COUMADIN) 1 MG tablet TAKE 2-3 TABLETS BY MOUTH ONCE DAILY AS DIRECTED BY ANTICOAGULATION CLINIC.   No facility-administered encounter medications on file as of 08/28/2014.      Review of Systems  Constitutional: Negative.   HENT: Negative.   Eyes: Negative.   Respiratory: Negative.   Cardiovascular: Negative.   Gastrointestinal: Positive for blood in stool  and rectal pain.  Endocrine: Negative.   Genitourinary: Negative.   Musculoskeletal: Negative.   Skin: Negative.   Allergic/Immunologic: Negative.   Neurological: Negative.   Hematological: Negative.   Psychiatric/Behavioral: Negative.        Objective:   Physical Exam  Constitutional: He is oriented to person, place, and time. He appears well-developed and well-nourished. He appears distressed.  HENT:  Head: Normocephalic and atraumatic.  Right Ear: External ear normal.  Left Ear: External ear normal.  Nose: Nose normal.  Mouth/Throat: Oropharynx is clear and moist. No oropharyngeal exudate.  Eyes: Conjunctivae and EOM are normal. Pupils are equal, round, and reactive to light. Right eye exhibits no discharge. Left eye exhibits no discharge. No scleral icterus.  Neck: Normal range of motion. Neck supple. No thyromegaly present.  No adenopathy or thyromegaly  Cardiovascular: Normal rate, regular rhythm and normal heart sounds.   No murmur heard. At 72/m  Pulmonary/Chest: Effort normal and breath sounds normal. No respiratory distress. He has no wheezes. He has no rales. He exhibits no tenderness.  Dry cough  Abdominal: Soft. Bowel sounds are normal. He exhibits no mass. There is no tenderness. There is no rebound and no guarding.  Genitourinary: Rectum normal.  External hemorrhoids and positive Hemoccult  Musculoskeletal: Normal range of motion. He exhibits no edema or tenderness.  Lymphadenopathy:    He has no cervical adenopathy.  Neurological: He is alert and oriented to person, place, and time.  Skin: Skin is warm and dry. No rash noted. No erythema. There is pallor.  Psychiatric: He has a normal mood and affect. His behavior is normal. Judgment and thought content normal.  Nursing note and vitals reviewed.  BP 192/74 mmHg  Pulse 71  Temp(Src) 97.9 F (36.6 C) (Oral)  Ht 5' 9.5" (1.765 m)  Wt 196 lb (88.905 kg)  BMI 28.54 kg/m2  Results for orders placed or  performed in visit on 08/28/14  POCT CBC  Result Value Ref Range   WBC 3.3 (A) 4.6 - 10.2 K/uL   Lymph, poc 1.0 0.6 - 3.4   POC LYMPH PERCENT 29.2 10 - 50 %L   POC Granulocyte 2.0 2 - 6.9   Granulocyte percent 61.8 37 - 80 %G   RBC 3.81 (A) 4.69 - 6.13 M/uL   Hemoglobin 10.0 (A) 14.1 - 18.1 g/dL   HCT, POC 33.3 (A) 43.5 - 53.7 %   MCV 87.5 80 - 97 fL   MCH, POC 26.2 (A) 27 - 31.2 pg   MCHC 29.9 (A) 31.8 - 35.4 g/dL   RDW, POC 14.4 %   Platelet Count, POC 127 (A) 142 - 424 K/uL   MPV 8.1 0 - 99.8 fL   The patient was made aware of the results of this lab work before he left the office  The Hemoccult card in the office was positive      Assessment & Plan:  1. Rectal bleeding -The Hemoccult card was positive -He will be given an FOBT card to take home and  he will return this in 2 days -We will continue to check a hemoglobin daily and call us with that result - POCT CBC  2. Elevated INR -He will continue to hold the Coumadin and he will check an INR daily and call us with that result  3. Weakness -If the weakness and/or shortness of breath become profoundly should go to the emergency room  4. CKD (chronic kidney disease) stage V requiring chronic dialysis -Continue regular follow-up with specialist at [facility]  Patient Instructions  The patient should continue to drink plenty of fluids He should continue to hold his Coumadin as directed by the clinical pharmacists and check the pro-time daily If he becomes short of breath and extremely weak he should go to the emergency room immediately He should continue to take his hemoglobin regularly. He should call us with the results of these tests daily   Arrie Senate MD

## 2014-08-28 NOTE — Addendum Note (Signed)
Addended by: Zannie Cove on: 08/28/2014 06:48 PM   Modules accepted: Orders

## 2014-08-28 NOTE — Patient Instructions (Signed)
The patient should continue to drink plenty of fluids He should continue to hold his Coumadin as directed by the clinical pharmacists and check the pro-time daily If he becomes short of breath and extremely weak he should go to the emergency room immediately He should continue to take his hemoglobin regularly. He should call us with the results of these tests daily

## 2014-08-28 NOTE — Progress Notes (Signed)
Patient given appt to see provider in our office today for rectal bleeding.

## 2014-08-29 ENCOUNTER — Telehealth: Payer: Self-pay | Admitting: Pharmacist Clinician (PhC)/ Clinical Pharmacy Specialist

## 2014-08-29 NOTE — Telephone Encounter (Signed)
Patient called with INR today of 3.6.  I instructed patient to not take warfarin today and to eat a large serving of a high vitamin K food today.

## 2014-08-30 ENCOUNTER — Other Ambulatory Visit (INDEPENDENT_AMBULATORY_CARE_PROVIDER_SITE_OTHER): Payer: Medicare Other

## 2014-08-30 ENCOUNTER — Ambulatory Visit (INDEPENDENT_AMBULATORY_CARE_PROVIDER_SITE_OTHER): Payer: Medicare Other | Admitting: Pharmacist

## 2014-08-30 DIAGNOSIS — Z992 Dependence on renal dialysis: Secondary | ICD-10-CM

## 2014-08-30 DIAGNOSIS — K625 Hemorrhage of anus and rectum: Secondary | ICD-10-CM

## 2014-08-30 DIAGNOSIS — Z952 Presence of prosthetic heart valve: Secondary | ICD-10-CM

## 2014-08-30 DIAGNOSIS — R531 Weakness: Secondary | ICD-10-CM

## 2014-08-30 DIAGNOSIS — Z7901 Long term (current) use of anticoagulants: Secondary | ICD-10-CM

## 2014-08-30 DIAGNOSIS — Z954 Presence of other heart-valve replacement: Secondary | ICD-10-CM

## 2014-08-30 DIAGNOSIS — R791 Abnormal coagulation profile: Secondary | ICD-10-CM | POA: Diagnosis not present

## 2014-08-30 DIAGNOSIS — N186 End stage renal disease: Secondary | ICD-10-CM | POA: Diagnosis not present

## 2014-08-30 DIAGNOSIS — Z5181 Encounter for therapeutic drug level monitoring: Secondary | ICD-10-CM

## 2014-08-30 LAB — POCT CBC
Granulocyte percent: 54 %G (ref 37–80)
HCT, POC: 32.6 % — AB (ref 43.5–53.7)
Hemoglobin: 10 g/dL — AB (ref 14.1–18.1)
Lymph, poc: 1.1 (ref 0.6–3.4)
MCH, POC: 27.1 pg (ref 27–31.2)
MCHC: 30.7 g/dL — AB (ref 31.8–35.4)
MCV: 88.2 fL (ref 80–97)
MPV: 7.8 fL (ref 0–99.8)
POC GRANULOCYTE: 1.4 — AB (ref 2–6.9)
POC LYMPH PERCENT: 41.1 %L (ref 10–50)
Platelet Count, POC: 116 10*3/uL — AB (ref 142–424)
RBC: 3.69 M/uL — AB (ref 4.69–6.13)
RDW, POC: 15 %
WBC: 2.6 10*3/uL — AB (ref 4.6–10.2)

## 2014-08-30 LAB — POCT INR: INR: 2.5

## 2014-08-30 NOTE — Progress Notes (Signed)
INR results faxed from home INR monitoring [place] No charge for today's visit

## 2014-08-31 ENCOUNTER — Ambulatory Visit: Payer: Self-pay | Admitting: Pharmacist

## 2014-08-31 DIAGNOSIS — Z5181 Encounter for therapeutic drug level monitoring: Secondary | ICD-10-CM

## 2014-08-31 DIAGNOSIS — Z952 Presence of prosthetic heart valve: Secondary | ICD-10-CM

## 2014-08-31 DIAGNOSIS — Z7901 Long term (current) use of anticoagulants: Secondary | ICD-10-CM

## 2014-08-31 LAB — CBC WITH DIFFERENTIAL/PLATELET
BASOS: 0 %
Basophils Absolute: 0 10*3/uL (ref 0.0–0.2)
EOS (ABSOLUTE): 0.1 10*3/uL (ref 0.0–0.4)
EOS: 5 %
HEMATOCRIT: 32.2 % — AB (ref 37.5–51.0)
Hemoglobin: 10 g/dL — ABNORMAL LOW (ref 12.6–17.7)
IMMATURE GRANS (ABS): 0 10*3/uL (ref 0.0–0.1)
IMMATURE GRANULOCYTES: 0 %
LYMPHS: 33 %
Lymphocytes Absolute: 0.8 10*3/uL (ref 0.7–3.1)
MCH: 27.6 pg (ref 26.6–33.0)
MCHC: 31.1 g/dL — AB (ref 31.5–35.7)
MCV: 89 fL (ref 79–97)
Monocytes Absolute: 0.3 10*3/uL (ref 0.1–0.9)
Monocytes: 13 %
Neutrophils Absolute: 1.2 10*3/uL — ABNORMAL LOW (ref 1.4–7.0)
Neutrophils: 49 %
PLATELETS: 125 10*3/uL — AB (ref 150–379)
RBC: 3.62 x10E6/uL — AB (ref 4.14–5.80)
RDW: 14 % (ref 12.3–15.4)
WBC: 2.5 10*3/uL — CL (ref 3.4–10.8)

## 2014-08-31 LAB — POCT INR: INR: 1.8

## 2014-09-02 LAB — FECAL OCCULT BLOOD, IMMUNOCHEMICAL: FECAL OCCULT BLD: NEGATIVE

## 2014-09-04 DIAGNOSIS — K649 Unspecified hemorrhoids: Secondary | ICD-10-CM | POA: Diagnosis not present

## 2014-09-04 DIAGNOSIS — Z952 Presence of prosthetic heart valve: Secondary | ICD-10-CM | POA: Diagnosis not present

## 2014-09-04 DIAGNOSIS — K625 Hemorrhage of anus and rectum: Secondary | ICD-10-CM | POA: Diagnosis not present

## 2014-09-04 DIAGNOSIS — K921 Melena: Secondary | ICD-10-CM | POA: Diagnosis not present

## 2014-09-04 DIAGNOSIS — Z85528 Personal history of other malignant neoplasm of kidney: Secondary | ICD-10-CM | POA: Diagnosis not present

## 2014-09-16 ENCOUNTER — Other Ambulatory Visit (INDEPENDENT_AMBULATORY_CARE_PROVIDER_SITE_OTHER): Payer: Medicare Other

## 2014-09-16 DIAGNOSIS — K648 Other hemorrhoids: Secondary | ICD-10-CM | POA: Diagnosis not present

## 2014-09-16 LAB — POCT CBC
Granulocyte percent: 71.3 % (ref 37–80)
HCT, POC: 32.4 % — AB (ref 43.5–53.7)
Hemoglobin: 10 g/dL — AB (ref 14.1–18.1)
Lymph, poc: 0.8 (ref 0.6–3.4)
MCH, POC: 27.1 pg (ref 27–31.2)
MCHC: 30.8 g/dL — AB (ref 31.8–35.4)
MCV: 88.2 fL (ref 80–97)
MPV: 8.3 fL (ref 0–99.8)
POC Granulocyte: 2.7 (ref 2–6.9)
POC LYMPH PERCENT: 20.5 % (ref 10–50)
Platelet Count, POC: 126 10*3/uL — AB (ref 142–424)
RBC: 3.67 M/uL — AB (ref 4.69–6.13)
RDW, POC: 15.3 %
WBC: 3.8 10*3/uL — AB (ref 4.6–10.2)

## 2014-09-17 LAB — POCT INR: INR: 2.7

## 2014-09-18 ENCOUNTER — Telehealth: Payer: Self-pay | Admitting: *Deleted

## 2014-09-18 ENCOUNTER — Ambulatory Visit (INDEPENDENT_AMBULATORY_CARE_PROVIDER_SITE_OTHER): Payer: Medicare Other | Admitting: Pharmacist

## 2014-09-18 DIAGNOSIS — Z954 Presence of other heart-valve replacement: Secondary | ICD-10-CM | POA: Diagnosis not present

## 2014-09-18 DIAGNOSIS — Z5181 Encounter for therapeutic drug level monitoring: Secondary | ICD-10-CM | POA: Diagnosis not present

## 2014-09-18 DIAGNOSIS — Z952 Presence of prosthetic heart valve: Secondary | ICD-10-CM

## 2014-09-18 DIAGNOSIS — Z7901 Long term (current) use of anticoagulants: Secondary | ICD-10-CM | POA: Diagnosis not present

## 2014-09-18 DIAGNOSIS — N183 Chronic kidney disease, stage 3 (moderate): Secondary | ICD-10-CM | POA: Diagnosis not present

## 2014-09-18 DIAGNOSIS — D631 Anemia in chronic kidney disease: Secondary | ICD-10-CM | POA: Diagnosis not present

## 2014-09-18 NOTE — Telephone Encounter (Signed)
lmtcb regarding test results. 

## 2014-09-18 NOTE — Progress Notes (Signed)
Patient checks INR at home with Home INR monitoring.  Billing once per month interupertation fee.  Patient diagnosis - Z95.2 prosthetic heart valve (aortic) and Z79.01 long term anticoagulation thearpy Procedure code if G0250

## 2014-09-18 NOTE — Telephone Encounter (Signed)
-----   Message from Chipper Herb, MD sent at 09/17/2014 12:14 PM EDT ----- CBC has a white blood cell count that is slightly higher but still low compared to readings over the past few weeks. This value is 3.8. The hemoglobin remains stable at 10.0 and does not appear to be dropping anymore. The platelet count is low but stable and consistent with past readings in the 126,000. Please make a copy of this report for the patient to take with him to see his specialty doctors in [place].

## 2014-09-20 ENCOUNTER — Telehealth: Payer: Self-pay | Admitting: *Deleted

## 2014-09-20 NOTE — Telephone Encounter (Signed)
-----   Message from Chipper Herb, MD sent at 09/17/2014 12:14 PM EDT ----- CBC has a white blood cell count that is slightly higher but still low compared to readings over the past few weeks. This value is 3.8. The hemoglobin remains stable at 10.0 and does not appear to be dropping anymore. The platelet count is low but stable and consistent with past readings in the 126,000. Please make a copy of this report for the patient to take with him to see his specialty doctors in [place].

## 2014-09-20 NOTE — Telephone Encounter (Signed)
Pt notified of results Verbalizes understanding 

## 2014-09-25 DIAGNOSIS — Z954 Presence of other heart-valve replacement: Secondary | ICD-10-CM | POA: Diagnosis not present

## 2014-09-25 LAB — POCT INR: INR: 2.7

## 2014-10-02 ENCOUNTER — Encounter: Payer: Self-pay | Admitting: Family Medicine

## 2014-10-02 ENCOUNTER — Other Ambulatory Visit: Payer: Medicare Other

## 2014-10-02 DIAGNOSIS — Z1212 Encounter for screening for malignant neoplasm of rectum: Secondary | ICD-10-CM

## 2014-10-02 DIAGNOSIS — L570 Actinic keratosis: Secondary | ICD-10-CM | POA: Diagnosis not present

## 2014-10-02 DIAGNOSIS — L723 Sebaceous cyst: Secondary | ICD-10-CM | POA: Diagnosis not present

## 2014-10-02 DIAGNOSIS — L282 Other prurigo: Secondary | ICD-10-CM | POA: Diagnosis not present

## 2014-10-04 LAB — FECAL OCCULT BLOOD, IMMUNOCHEMICAL: Fecal Occult Bld: POSITIVE — AB

## 2014-10-05 ENCOUNTER — Ambulatory Visit (INDEPENDENT_AMBULATORY_CARE_PROVIDER_SITE_OTHER): Payer: Medicare Other | Admitting: Pharmacist

## 2014-10-05 DIAGNOSIS — Z952 Presence of prosthetic heart valve: Secondary | ICD-10-CM

## 2014-10-05 DIAGNOSIS — Z954 Presence of other heart-valve replacement: Secondary | ICD-10-CM

## 2014-10-05 DIAGNOSIS — Z7901 Long term (current) use of anticoagulants: Secondary | ICD-10-CM

## 2014-10-05 DIAGNOSIS — Z5181 Encounter for therapeutic drug level monitoring: Secondary | ICD-10-CM

## 2014-10-05 LAB — POCT INR: INR: 3.2

## 2014-10-05 NOTE — Progress Notes (Signed)
No charge for labs or visit - INR check through home monitoring system  

## 2014-10-11 ENCOUNTER — Ambulatory Visit (INDEPENDENT_AMBULATORY_CARE_PROVIDER_SITE_OTHER): Payer: Medicare Other | Admitting: Pharmacist

## 2014-10-11 DIAGNOSIS — Z952 Presence of prosthetic heart valve: Secondary | ICD-10-CM

## 2014-10-11 DIAGNOSIS — Z954 Presence of other heart-valve replacement: Secondary | ICD-10-CM | POA: Diagnosis not present

## 2014-10-11 DIAGNOSIS — Z7901 Long term (current) use of anticoagulants: Secondary | ICD-10-CM | POA: Diagnosis not present

## 2014-10-11 DIAGNOSIS — Z5181 Encounter for therapeutic drug level monitoring: Secondary | ICD-10-CM | POA: Diagnosis not present

## 2014-10-11 LAB — POCT INR: INR: 2.5

## 2014-10-11 NOTE — Progress Notes (Signed)
Patient checks INR at home with Home INR monitoring.  Billing once per month interupertation fee.  Patient diagnosis - s/p AVR Procedure code if G0250

## 2014-10-13 DIAGNOSIS — I120 Hypertensive chronic kidney disease with stage 5 chronic kidney disease or end stage renal disease: Secondary | ICD-10-CM | POA: Diagnosis not present

## 2014-10-13 DIAGNOSIS — N028 Recurrent and persistent hematuria with other morphologic changes: Secondary | ICD-10-CM | POA: Diagnosis not present

## 2014-10-13 DIAGNOSIS — N186 End stage renal disease: Secondary | ICD-10-CM | POA: Diagnosis not present

## 2014-10-13 DIAGNOSIS — Z940 Kidney transplant status: Secondary | ICD-10-CM | POA: Diagnosis not present

## 2014-10-17 DIAGNOSIS — I10 Essential (primary) hypertension: Secondary | ICD-10-CM | POA: Diagnosis not present

## 2014-10-17 DIAGNOSIS — Z940 Kidney transplant status: Secondary | ICD-10-CM | POA: Diagnosis not present

## 2014-10-18 DIAGNOSIS — I120 Hypertensive chronic kidney disease with stage 5 chronic kidney disease or end stage renal disease: Secondary | ICD-10-CM | POA: Diagnosis not present

## 2014-10-18 DIAGNOSIS — Z940 Kidney transplant status: Secondary | ICD-10-CM | POA: Diagnosis not present

## 2014-10-18 DIAGNOSIS — Z955 Presence of coronary angioplasty implant and graft: Secondary | ICD-10-CM | POA: Diagnosis not present

## 2014-10-18 DIAGNOSIS — N186 End stage renal disease: Secondary | ICD-10-CM | POA: Diagnosis not present

## 2014-10-18 DIAGNOSIS — Z7901 Long term (current) use of anticoagulants: Secondary | ICD-10-CM | POA: Diagnosis not present

## 2014-10-18 DIAGNOSIS — N028 Recurrent and persistent hematuria with other morphologic changes: Secondary | ICD-10-CM | POA: Diagnosis not present

## 2014-10-18 DIAGNOSIS — Z4822 Encounter for aftercare following kidney transplant: Secondary | ICD-10-CM | POA: Diagnosis not present

## 2014-10-18 DIAGNOSIS — D631 Anemia in chronic kidney disease: Secondary | ICD-10-CM | POA: Diagnosis not present

## 2014-10-18 DIAGNOSIS — M4800 Spinal stenosis, site unspecified: Secondary | ICD-10-CM | POA: Diagnosis not present

## 2014-10-18 DIAGNOSIS — I2510 Atherosclerotic heart disease of native coronary artery without angina pectoris: Secondary | ICD-10-CM | POA: Diagnosis not present

## 2014-10-18 DIAGNOSIS — D899 Disorder involving the immune mechanism, unspecified: Secondary | ICD-10-CM | POA: Diagnosis not present

## 2014-10-18 DIAGNOSIS — E785 Hyperlipidemia, unspecified: Secondary | ICD-10-CM | POA: Diagnosis not present

## 2014-10-18 DIAGNOSIS — M25511 Pain in right shoulder: Secondary | ICD-10-CM | POA: Diagnosis not present

## 2014-10-20 LAB — POCT INR: INR: 2.7

## 2014-10-23 ENCOUNTER — Ambulatory Visit (INDEPENDENT_AMBULATORY_CARE_PROVIDER_SITE_OTHER): Payer: Medicare Other | Admitting: Pharmacist

## 2014-10-23 DIAGNOSIS — Z952 Presence of prosthetic heart valve: Secondary | ICD-10-CM

## 2014-10-23 DIAGNOSIS — Z7901 Long term (current) use of anticoagulants: Secondary | ICD-10-CM

## 2014-10-23 DIAGNOSIS — Z5181 Encounter for therapeutic drug level monitoring: Secondary | ICD-10-CM

## 2014-10-23 DIAGNOSIS — Z954 Presence of other heart-valve replacement: Secondary | ICD-10-CM

## 2014-10-23 NOTE — Progress Notes (Signed)
No charge for labs or visit - INR check through home monitoring system  

## 2014-10-29 DIAGNOSIS — Z954 Presence of other heart-valve replacement: Secondary | ICD-10-CM | POA: Diagnosis not present

## 2014-10-29 LAB — POCT INR: INR: 3.2

## 2014-10-30 ENCOUNTER — Ambulatory Visit (INDEPENDENT_AMBULATORY_CARE_PROVIDER_SITE_OTHER): Payer: Medicare Other | Admitting: Pharmacist

## 2014-10-30 DIAGNOSIS — A5203 Syphilitic endocarditis: Secondary | ICD-10-CM | POA: Diagnosis not present

## 2014-10-30 DIAGNOSIS — D631 Anemia in chronic kidney disease: Secondary | ICD-10-CM | POA: Diagnosis not present

## 2014-10-30 DIAGNOSIS — Z7901 Long term (current) use of anticoagulants: Secondary | ICD-10-CM

## 2014-10-30 DIAGNOSIS — Z5181 Encounter for therapeutic drug level monitoring: Secondary | ICD-10-CM

## 2014-10-30 DIAGNOSIS — Z952 Presence of prosthetic heart valve: Secondary | ICD-10-CM

## 2014-10-30 DIAGNOSIS — N183 Chronic kidney disease, stage 3 (moderate): Secondary | ICD-10-CM | POA: Diagnosis not present

## 2014-10-30 DIAGNOSIS — Z940 Kidney transplant status: Secondary | ICD-10-CM | POA: Diagnosis not present

## 2014-10-30 DIAGNOSIS — Z954 Presence of other heart-valve replacement: Secondary | ICD-10-CM

## 2014-10-30 DIAGNOSIS — Z959 Presence of cardiac and vascular implant and graft, unspecified: Secondary | ICD-10-CM | POA: Diagnosis not present

## 2014-10-30 DIAGNOSIS — I272 Other secondary pulmonary hypertension: Secondary | ICD-10-CM | POA: Diagnosis not present

## 2014-10-30 DIAGNOSIS — I2510 Atherosclerotic heart disease of native coronary artery without angina pectoris: Secondary | ICD-10-CM | POA: Diagnosis not present

## 2014-10-30 DIAGNOSIS — I517 Cardiomegaly: Secondary | ICD-10-CM | POA: Diagnosis not present

## 2014-10-30 DIAGNOSIS — I351 Nonrheumatic aortic (valve) insufficiency: Secondary | ICD-10-CM | POA: Diagnosis not present

## 2014-10-30 DIAGNOSIS — I361 Nonrheumatic tricuspid (valve) insufficiency: Secondary | ICD-10-CM | POA: Diagnosis not present

## 2014-10-30 NOTE — Progress Notes (Signed)
No charge for labs or visit - INR check through home monitoring system  

## 2014-11-02 DIAGNOSIS — K625 Hemorrhage of anus and rectum: Secondary | ICD-10-CM | POA: Diagnosis not present

## 2014-11-02 DIAGNOSIS — K921 Melena: Secondary | ICD-10-CM | POA: Diagnosis not present

## 2014-11-02 DIAGNOSIS — D509 Iron deficiency anemia, unspecified: Secondary | ICD-10-CM | POA: Insufficient documentation

## 2014-11-03 DIAGNOSIS — Z48298 Encounter for aftercare following other organ transplant: Secondary | ICD-10-CM | POA: Diagnosis not present

## 2014-11-03 DIAGNOSIS — Z4822 Encounter for aftercare following kidney transplant: Secondary | ICD-10-CM | POA: Diagnosis not present

## 2014-11-06 DIAGNOSIS — Z7901 Long term (current) use of anticoagulants: Secondary | ICD-10-CM | POA: Insufficient documentation

## 2014-11-08 ENCOUNTER — Ambulatory Visit (INDEPENDENT_AMBULATORY_CARE_PROVIDER_SITE_OTHER): Payer: Medicare Other | Admitting: Pharmacist

## 2014-11-08 DIAGNOSIS — Z7901 Long term (current) use of anticoagulants: Secondary | ICD-10-CM

## 2014-11-08 DIAGNOSIS — Z954 Presence of other heart-valve replacement: Secondary | ICD-10-CM

## 2014-11-08 DIAGNOSIS — Z5181 Encounter for therapeutic drug level monitoring: Secondary | ICD-10-CM | POA: Diagnosis not present

## 2014-11-08 DIAGNOSIS — Z952 Presence of prosthetic heart valve: Secondary | ICD-10-CM

## 2014-11-08 LAB — POCT INR: INR: 1.5

## 2014-11-08 NOTE — Progress Notes (Signed)
Patient checks INR at home with Home INR monitoring.  Billing once per month interupertation fee.  Patient diagnosis - AVR Procedure code if G0250   Patient held warfarin last week for 3 days prior to planned biopsy of kidney.  However biopsy was cancelled due to improved  kidney function.  Patinet is currently injecting lovenox bid until INR 2.0 or over and has restart warfarin.   Patient advised to take 3 tablets of warfarin today , then continue current warfarin 1mg  dose of 3 tablets on Sundays and 2 tablets all other days. Continue Lovenox as planned twice a day until INR 2.0 or over.

## 2014-11-09 ENCOUNTER — Ambulatory Visit (INDEPENDENT_AMBULATORY_CARE_PROVIDER_SITE_OTHER): Payer: Medicare Other | Admitting: Pharmacist

## 2014-11-09 ENCOUNTER — Telehealth: Payer: Self-pay | Admitting: *Deleted

## 2014-11-09 DIAGNOSIS — Z954 Presence of other heart-valve replacement: Secondary | ICD-10-CM

## 2014-11-09 DIAGNOSIS — Z5181 Encounter for therapeutic drug level monitoring: Secondary | ICD-10-CM

## 2014-11-09 DIAGNOSIS — Z952 Presence of prosthetic heart valve: Secondary | ICD-10-CM

## 2014-11-09 DIAGNOSIS — Z7901 Long term (current) use of anticoagulants: Secondary | ICD-10-CM

## 2014-11-09 LAB — POCT INR: INR: 1.4

## 2014-11-09 NOTE — Telephone Encounter (Signed)
INR today was 1.4

## 2014-11-09 NOTE — Telephone Encounter (Signed)
Patient advised to take 4 tablets = 4mg  today.  Recheck INR tomorrow.  Continue Lovenox q12h until INR 2.0 or higher.

## 2014-11-10 ENCOUNTER — Ambulatory Visit (INDEPENDENT_AMBULATORY_CARE_PROVIDER_SITE_OTHER): Payer: Medicare Other | Admitting: Pharmacist

## 2014-11-10 DIAGNOSIS — Z5181 Encounter for therapeutic drug level monitoring: Secondary | ICD-10-CM

## 2014-11-10 DIAGNOSIS — Z954 Presence of other heart-valve replacement: Secondary | ICD-10-CM

## 2014-11-10 DIAGNOSIS — Z952 Presence of prosthetic heart valve: Secondary | ICD-10-CM

## 2014-11-10 DIAGNOSIS — Z7901 Long term (current) use of anticoagulants: Secondary | ICD-10-CM

## 2014-11-10 LAB — POCT INR: INR: 1.7

## 2014-11-10 MED ORDER — WARFARIN SODIUM 1 MG PO TABS: ORAL_TABLET | ORAL | Status: DC

## 2014-11-14 DIAGNOSIS — Z7901 Long term (current) use of anticoagulants: Secondary | ICD-10-CM | POA: Diagnosis not present

## 2014-11-14 DIAGNOSIS — F419 Anxiety disorder, unspecified: Secondary | ICD-10-CM | POA: Diagnosis not present

## 2014-11-14 DIAGNOSIS — Z7952 Long term (current) use of systemic steroids: Secondary | ICD-10-CM | POA: Diagnosis not present

## 2014-11-14 DIAGNOSIS — Z9861 Coronary angioplasty status: Secondary | ICD-10-CM | POA: Diagnosis not present

## 2014-11-14 DIAGNOSIS — Z79899 Other long term (current) drug therapy: Secondary | ICD-10-CM | POA: Diagnosis not present

## 2014-11-14 DIAGNOSIS — E785 Hyperlipidemia, unspecified: Secondary | ICD-10-CM | POA: Diagnosis not present

## 2014-11-14 DIAGNOSIS — Z940 Kidney transplant status: Secondary | ICD-10-CM | POA: Diagnosis not present

## 2014-11-14 DIAGNOSIS — I2510 Atherosclerotic heart disease of native coronary artery without angina pectoris: Secondary | ICD-10-CM | POA: Diagnosis not present

## 2014-11-14 DIAGNOSIS — D631 Anemia in chronic kidney disease: Secondary | ICD-10-CM | POA: Diagnosis not present

## 2014-11-14 DIAGNOSIS — Z7982 Long term (current) use of aspirin: Secondary | ICD-10-CM | POA: Diagnosis not present

## 2014-11-14 DIAGNOSIS — R0602 Shortness of breath: Secondary | ICD-10-CM | POA: Diagnosis not present

## 2014-11-14 DIAGNOSIS — N028 Recurrent and persistent hematuria with other morphologic changes: Secondary | ICD-10-CM | POA: Diagnosis not present

## 2014-11-14 DIAGNOSIS — N186 End stage renal disease: Secondary | ICD-10-CM | POA: Diagnosis not present

## 2014-11-14 DIAGNOSIS — I120 Hypertensive chronic kidney disease with stage 5 chronic kidney disease or end stage renal disease: Secondary | ICD-10-CM | POA: Diagnosis not present

## 2014-11-16 ENCOUNTER — Ambulatory Visit (INDEPENDENT_AMBULATORY_CARE_PROVIDER_SITE_OTHER): Payer: Medicare Other | Admitting: Pharmacist

## 2014-11-16 DIAGNOSIS — Z7901 Long term (current) use of anticoagulants: Secondary | ICD-10-CM

## 2014-11-16 DIAGNOSIS — Z954 Presence of other heart-valve replacement: Secondary | ICD-10-CM

## 2014-11-16 DIAGNOSIS — Z5181 Encounter for therapeutic drug level monitoring: Secondary | ICD-10-CM

## 2014-11-16 DIAGNOSIS — Z952 Presence of prosthetic heart valve: Secondary | ICD-10-CM

## 2014-11-16 LAB — POCT INR: INR: 2.3

## 2014-11-21 DIAGNOSIS — C649 Malignant neoplasm of unspecified kidney, except renal pelvis: Secondary | ICD-10-CM | POA: Diagnosis not present

## 2014-11-21 DIAGNOSIS — Z940 Kidney transplant status: Secondary | ICD-10-CM | POA: Diagnosis not present

## 2014-11-21 DIAGNOSIS — Z79899 Other long term (current) drug therapy: Secondary | ICD-10-CM | POA: Diagnosis not present

## 2014-11-21 DIAGNOSIS — C641 Malignant neoplasm of right kidney, except renal pelvis: Secondary | ICD-10-CM | POA: Diagnosis not present

## 2014-11-21 DIAGNOSIS — Z7901 Long term (current) use of anticoagulants: Secondary | ICD-10-CM | POA: Diagnosis not present

## 2014-11-21 DIAGNOSIS — Z7982 Long term (current) use of aspirin: Secondary | ICD-10-CM | POA: Diagnosis not present

## 2014-11-21 DIAGNOSIS — I5020 Unspecified systolic (congestive) heart failure: Secondary | ICD-10-CM | POA: Diagnosis not present

## 2014-11-21 DIAGNOSIS — C801 Malignant (primary) neoplasm, unspecified: Secondary | ICD-10-CM | POA: Diagnosis not present

## 2014-11-21 DIAGNOSIS — Z85528 Personal history of other malignant neoplasm of kidney: Secondary | ICD-10-CM | POA: Diagnosis not present

## 2014-11-21 DIAGNOSIS — Z08 Encounter for follow-up examination after completed treatment for malignant neoplasm: Secondary | ICD-10-CM | POA: Diagnosis not present

## 2014-11-24 ENCOUNTER — Ambulatory Visit (INDEPENDENT_AMBULATORY_CARE_PROVIDER_SITE_OTHER): Payer: Medicare Other | Admitting: Pharmacist

## 2014-11-24 DIAGNOSIS — Z7901 Long term (current) use of anticoagulants: Secondary | ICD-10-CM

## 2014-11-24 DIAGNOSIS — Z5181 Encounter for therapeutic drug level monitoring: Secondary | ICD-10-CM

## 2014-11-24 DIAGNOSIS — Z952 Presence of prosthetic heart valve: Secondary | ICD-10-CM

## 2014-11-24 DIAGNOSIS — Z954 Presence of other heart-valve replacement: Secondary | ICD-10-CM

## 2014-11-24 LAB — POCT INR: INR: 1.9

## 2014-11-27 DIAGNOSIS — E559 Vitamin D deficiency, unspecified: Secondary | ICD-10-CM | POA: Diagnosis not present

## 2014-11-27 DIAGNOSIS — D509 Iron deficiency anemia, unspecified: Secondary | ICD-10-CM | POA: Diagnosis not present

## 2014-11-27 DIAGNOSIS — Z888 Allergy status to other drugs, medicaments and biological substances status: Secondary | ICD-10-CM | POA: Diagnosis not present

## 2014-11-27 DIAGNOSIS — I129 Hypertensive chronic kidney disease with stage 1 through stage 4 chronic kidney disease, or unspecified chronic kidney disease: Secondary | ICD-10-CM | POA: Diagnosis not present

## 2014-11-27 DIAGNOSIS — E780 Pure hypercholesterolemia: Secondary | ICD-10-CM | POA: Diagnosis not present

## 2014-11-27 DIAGNOSIS — Z79899 Other long term (current) drug therapy: Secondary | ICD-10-CM | POA: Diagnosis not present

## 2014-11-27 DIAGNOSIS — K921 Melena: Secondary | ICD-10-CM | POA: Diagnosis not present

## 2014-11-27 DIAGNOSIS — K648 Other hemorrhoids: Secondary | ICD-10-CM | POA: Diagnosis not present

## 2014-11-27 DIAGNOSIS — I10 Essential (primary) hypertension: Secondary | ICD-10-CM | POA: Diagnosis not present

## 2014-11-27 DIAGNOSIS — M810 Age-related osteoporosis without current pathological fracture: Secondary | ICD-10-CM | POA: Diagnosis not present

## 2014-11-27 DIAGNOSIS — Z940 Kidney transplant status: Secondary | ICD-10-CM | POA: Diagnosis not present

## 2014-11-27 DIAGNOSIS — K589 Irritable bowel syndrome without diarrhea: Secondary | ICD-10-CM | POA: Diagnosis not present

## 2014-11-27 DIAGNOSIS — K5730 Diverticulosis of large intestine without perforation or abscess without bleeding: Secondary | ICD-10-CM | POA: Diagnosis not present

## 2014-11-27 DIAGNOSIS — I2510 Atherosclerotic heart disease of native coronary artery without angina pectoris: Secondary | ICD-10-CM | POA: Diagnosis not present

## 2014-11-27 DIAGNOSIS — Z7982 Long term (current) use of aspirin: Secondary | ICD-10-CM | POA: Diagnosis not present

## 2014-11-27 DIAGNOSIS — I5020 Unspecified systolic (congestive) heart failure: Secondary | ICD-10-CM | POA: Diagnosis not present

## 2014-11-27 DIAGNOSIS — D631 Anemia in chronic kidney disease: Secondary | ICD-10-CM | POA: Diagnosis not present

## 2014-11-27 DIAGNOSIS — E079 Disorder of thyroid, unspecified: Secondary | ICD-10-CM | POA: Diagnosis not present

## 2014-11-27 DIAGNOSIS — C649 Malignant neoplasm of unspecified kidney, except renal pelvis: Secondary | ICD-10-CM | POA: Diagnosis not present

## 2014-11-27 DIAGNOSIS — K625 Hemorrhage of anus and rectum: Secondary | ICD-10-CM | POA: Diagnosis not present

## 2014-11-27 DIAGNOSIS — N183 Chronic kidney disease, stage 3 (moderate): Secondary | ICD-10-CM | POA: Diagnosis not present

## 2014-11-27 DIAGNOSIS — Z7901 Long term (current) use of anticoagulants: Secondary | ICD-10-CM | POA: Diagnosis not present

## 2014-11-27 DIAGNOSIS — F419 Anxiety disorder, unspecified: Secondary | ICD-10-CM | POA: Diagnosis not present

## 2014-11-27 DIAGNOSIS — M5416 Radiculopathy, lumbar region: Secondary | ICD-10-CM | POA: Diagnosis not present

## 2014-11-27 DIAGNOSIS — M4802 Spinal stenosis, cervical region: Secondary | ICD-10-CM | POA: Diagnosis not present

## 2014-11-27 DIAGNOSIS — Z9861 Coronary angioplasty status: Secondary | ICD-10-CM | POA: Diagnosis not present

## 2014-11-27 DIAGNOSIS — G4730 Sleep apnea, unspecified: Secondary | ICD-10-CM | POA: Diagnosis not present

## 2014-11-27 LAB — HM COLONOSCOPY

## 2014-11-28 DIAGNOSIS — Z954 Presence of other heart-valve replacement: Secondary | ICD-10-CM | POA: Diagnosis not present

## 2014-11-28 LAB — POCT INR: INR: 2.4

## 2014-11-29 ENCOUNTER — Ambulatory Visit (INDEPENDENT_AMBULATORY_CARE_PROVIDER_SITE_OTHER): Payer: Medicare Other | Admitting: Pharmacist

## 2014-11-29 DIAGNOSIS — Z7901 Long term (current) use of anticoagulants: Secondary | ICD-10-CM

## 2014-11-29 DIAGNOSIS — Z954 Presence of other heart-valve replacement: Secondary | ICD-10-CM

## 2014-11-29 DIAGNOSIS — Z5181 Encounter for therapeutic drug level monitoring: Secondary | ICD-10-CM

## 2014-11-29 DIAGNOSIS — Z952 Presence of prosthetic heart valve: Secondary | ICD-10-CM

## 2014-11-29 NOTE — Progress Notes (Signed)
INR was therapeutic.  Recommend continue current warfarin dose.  No charge for labs or visit - INR check through home monitoring system

## 2014-11-30 DIAGNOSIS — D6489 Other specified anemias: Secondary | ICD-10-CM | POA: Diagnosis not present

## 2014-11-30 DIAGNOSIS — Z940 Kidney transplant status: Secondary | ICD-10-CM | POA: Diagnosis not present

## 2014-11-30 DIAGNOSIS — N183 Chronic kidney disease, stage 3 (moderate): Secondary | ICD-10-CM | POA: Diagnosis not present

## 2014-12-07 ENCOUNTER — Ambulatory Visit (INDEPENDENT_AMBULATORY_CARE_PROVIDER_SITE_OTHER): Payer: Medicare Other | Admitting: Pharmacist

## 2014-12-07 ENCOUNTER — Encounter: Payer: Medicare Other | Admitting: Pharmacist

## 2014-12-07 DIAGNOSIS — Z5181 Encounter for therapeutic drug level monitoring: Secondary | ICD-10-CM

## 2014-12-07 DIAGNOSIS — Z954 Presence of other heart-valve replacement: Secondary | ICD-10-CM

## 2014-12-07 DIAGNOSIS — Z7901 Long term (current) use of anticoagulants: Secondary | ICD-10-CM

## 2014-12-07 DIAGNOSIS — Z952 Presence of prosthetic heart valve: Secondary | ICD-10-CM

## 2014-12-07 LAB — POCT INR: INR: 2

## 2014-12-07 NOTE — Progress Notes (Signed)
Patient checks INR at home with Home INR monitoring.  Billing once per month interupertation fee.  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

## 2014-12-13 ENCOUNTER — Ambulatory Visit (INDEPENDENT_AMBULATORY_CARE_PROVIDER_SITE_OTHER): Payer: Medicare Other | Admitting: Pharmacist

## 2014-12-13 DIAGNOSIS — Z954 Presence of other heart-valve replacement: Secondary | ICD-10-CM

## 2014-12-13 DIAGNOSIS — Z952 Presence of prosthetic heart valve: Secondary | ICD-10-CM

## 2014-12-13 DIAGNOSIS — Z5181 Encounter for therapeutic drug level monitoring: Secondary | ICD-10-CM

## 2014-12-13 DIAGNOSIS — Z7901 Long term (current) use of anticoagulants: Secondary | ICD-10-CM

## 2014-12-13 LAB — POCT INR: INR: 2.2

## 2014-12-14 DIAGNOSIS — D631 Anemia in chronic kidney disease: Secondary | ICD-10-CM | POA: Diagnosis not present

## 2014-12-14 DIAGNOSIS — N183 Chronic kidney disease, stage 3 (moderate): Secondary | ICD-10-CM | POA: Diagnosis not present

## 2014-12-21 LAB — POCT INR: INR: 1.8

## 2014-12-22 ENCOUNTER — Ambulatory Visit (INDEPENDENT_AMBULATORY_CARE_PROVIDER_SITE_OTHER): Payer: Medicare Other | Admitting: Pharmacist

## 2014-12-22 DIAGNOSIS — Z5181 Encounter for therapeutic drug level monitoring: Secondary | ICD-10-CM

## 2014-12-22 DIAGNOSIS — Z954 Presence of other heart-valve replacement: Secondary | ICD-10-CM

## 2014-12-22 DIAGNOSIS — Z952 Presence of prosthetic heart valve: Secondary | ICD-10-CM

## 2014-12-22 DIAGNOSIS — Z7901 Long term (current) use of anticoagulants: Secondary | ICD-10-CM

## 2014-12-25 DIAGNOSIS — K432 Incisional hernia without obstruction or gangrene: Secondary | ICD-10-CM | POA: Diagnosis not present

## 2014-12-25 DIAGNOSIS — Z7952 Long term (current) use of systemic steroids: Secondary | ICD-10-CM | POA: Diagnosis not present

## 2014-12-25 DIAGNOSIS — I272 Other secondary pulmonary hypertension: Secondary | ICD-10-CM | POA: Diagnosis not present

## 2014-12-25 DIAGNOSIS — I129 Hypertensive chronic kidney disease with stage 1 through stage 4 chronic kidney disease, or unspecified chronic kidney disease: Secondary | ICD-10-CM | POA: Diagnosis not present

## 2014-12-25 DIAGNOSIS — D631 Anemia in chronic kidney disease: Secondary | ICD-10-CM | POA: Diagnosis not present

## 2014-12-25 DIAGNOSIS — Z01818 Encounter for other preprocedural examination: Secondary | ICD-10-CM | POA: Diagnosis not present

## 2014-12-25 DIAGNOSIS — E785 Hyperlipidemia, unspecified: Secondary | ICD-10-CM | POA: Diagnosis not present

## 2014-12-25 DIAGNOSIS — Z7901 Long term (current) use of anticoagulants: Secondary | ICD-10-CM | POA: Diagnosis not present

## 2014-12-25 DIAGNOSIS — M4800 Spinal stenosis, site unspecified: Secondary | ICD-10-CM | POA: Diagnosis not present

## 2014-12-25 DIAGNOSIS — Z7982 Long term (current) use of aspirin: Secondary | ICD-10-CM | POA: Diagnosis not present

## 2014-12-25 DIAGNOSIS — Z940 Kidney transplant status: Secondary | ICD-10-CM | POA: Diagnosis not present

## 2014-12-25 DIAGNOSIS — N183 Chronic kidney disease, stage 3 (moderate): Secondary | ICD-10-CM | POA: Diagnosis not present

## 2014-12-25 DIAGNOSIS — M25511 Pain in right shoulder: Secondary | ICD-10-CM | POA: Diagnosis not present

## 2014-12-25 DIAGNOSIS — Z79899 Other long term (current) drug therapy: Secondary | ICD-10-CM | POA: Diagnosis not present

## 2014-12-25 DIAGNOSIS — I2510 Atherosclerotic heart disease of native coronary artery without angina pectoris: Secondary | ICD-10-CM | POA: Diagnosis not present

## 2014-12-25 DIAGNOSIS — N028 Recurrent and persistent hematuria with other morphologic changes: Secondary | ICD-10-CM | POA: Diagnosis not present

## 2014-12-29 ENCOUNTER — Ambulatory Visit (INDEPENDENT_AMBULATORY_CARE_PROVIDER_SITE_OTHER): Payer: Medicare Other | Admitting: Pharmacist

## 2014-12-29 DIAGNOSIS — Z7901 Long term (current) use of anticoagulants: Secondary | ICD-10-CM

## 2014-12-29 DIAGNOSIS — Z5181 Encounter for therapeutic drug level monitoring: Secondary | ICD-10-CM

## 2014-12-29 DIAGNOSIS — Z952 Presence of prosthetic heart valve: Secondary | ICD-10-CM

## 2014-12-29 DIAGNOSIS — Z954 Presence of other heart-valve replacement: Secondary | ICD-10-CM

## 2014-12-29 LAB — POCT INR: INR: 1.7

## 2014-12-29 NOTE — Progress Notes (Signed)
No charge for labs or visit - INR check through home monitoring system  

## 2015-01-01 DIAGNOSIS — Z85528 Personal history of other malignant neoplasm of kidney: Secondary | ICD-10-CM | POA: Diagnosis not present

## 2015-01-01 DIAGNOSIS — R600 Localized edema: Secondary | ICD-10-CM | POA: Diagnosis not present

## 2015-01-01 DIAGNOSIS — E785 Hyperlipidemia, unspecified: Secondary | ICD-10-CM | POA: Diagnosis not present

## 2015-01-01 DIAGNOSIS — Z7952 Long term (current) use of systemic steroids: Secondary | ICD-10-CM | POA: Diagnosis not present

## 2015-01-01 DIAGNOSIS — K8020 Calculus of gallbladder without cholecystitis without obstruction: Secondary | ICD-10-CM | POA: Diagnosis not present

## 2015-01-01 DIAGNOSIS — I129 Hypertensive chronic kidney disease with stage 1 through stage 4 chronic kidney disease, or unspecified chronic kidney disease: Secondary | ICD-10-CM | POA: Diagnosis not present

## 2015-01-01 DIAGNOSIS — N028 Recurrent and persistent hematuria with other morphologic changes: Secondary | ICD-10-CM | POA: Diagnosis not present

## 2015-01-01 DIAGNOSIS — Z7901 Long term (current) use of anticoagulants: Secondary | ICD-10-CM | POA: Diagnosis not present

## 2015-01-01 DIAGNOSIS — M6282 Rhabdomyolysis: Secondary | ICD-10-CM | POA: Diagnosis not present

## 2015-01-01 DIAGNOSIS — Z955 Presence of coronary angioplasty implant and graft: Secondary | ICD-10-CM | POA: Diagnosis not present

## 2015-01-01 DIAGNOSIS — M8580 Other specified disorders of bone density and structure, unspecified site: Secondary | ICD-10-CM | POA: Diagnosis not present

## 2015-01-01 DIAGNOSIS — R5383 Other fatigue: Secondary | ICD-10-CM | POA: Diagnosis not present

## 2015-01-01 DIAGNOSIS — Z79899 Other long term (current) drug therapy: Secondary | ICD-10-CM | POA: Diagnosis not present

## 2015-01-01 DIAGNOSIS — D631 Anemia in chronic kidney disease: Secondary | ICD-10-CM | POA: Diagnosis not present

## 2015-01-01 DIAGNOSIS — F419 Anxiety disorder, unspecified: Secondary | ICD-10-CM | POA: Diagnosis not present

## 2015-01-01 DIAGNOSIS — R0602 Shortness of breath: Secondary | ICD-10-CM | POA: Diagnosis not present

## 2015-01-01 DIAGNOSIS — Z952 Presence of prosthetic heart valve: Secondary | ICD-10-CM | POA: Diagnosis not present

## 2015-01-01 DIAGNOSIS — N183 Chronic kidney disease, stage 3 (moderate): Secondary | ICD-10-CM | POA: Diagnosis not present

## 2015-01-01 DIAGNOSIS — Z792 Long term (current) use of antibiotics: Secondary | ICD-10-CM | POA: Diagnosis not present

## 2015-01-01 DIAGNOSIS — Z940 Kidney transplant status: Secondary | ICD-10-CM | POA: Diagnosis not present

## 2015-01-01 DIAGNOSIS — E041 Nontoxic single thyroid nodule: Secondary | ICD-10-CM | POA: Diagnosis not present

## 2015-01-03 DIAGNOSIS — R809 Proteinuria, unspecified: Secondary | ICD-10-CM | POA: Insufficient documentation

## 2015-01-03 DIAGNOSIS — Z23 Encounter for immunization: Secondary | ICD-10-CM | POA: Diagnosis not present

## 2015-01-03 DIAGNOSIS — I2510 Atherosclerotic heart disease of native coronary artery without angina pectoris: Secondary | ICD-10-CM | POA: Diagnosis not present

## 2015-01-03 DIAGNOSIS — E559 Vitamin D deficiency, unspecified: Secondary | ICD-10-CM | POA: Diagnosis not present

## 2015-01-03 DIAGNOSIS — Z7689 Persons encountering health services in other specified circumstances: Secondary | ICD-10-CM | POA: Insufficient documentation

## 2015-01-03 DIAGNOSIS — Z7901 Long term (current) use of anticoagulants: Secondary | ICD-10-CM | POA: Diagnosis not present

## 2015-01-03 DIAGNOSIS — Z01818 Encounter for other preprocedural examination: Secondary | ICD-10-CM

## 2015-01-03 DIAGNOSIS — Z79899 Other long term (current) drug therapy: Secondary | ICD-10-CM | POA: Diagnosis not present

## 2015-01-03 DIAGNOSIS — D631 Anemia in chronic kidney disease: Secondary | ICD-10-CM | POA: Diagnosis not present

## 2015-01-03 DIAGNOSIS — R0602 Shortness of breath: Secondary | ICD-10-CM | POA: Diagnosis not present

## 2015-01-03 DIAGNOSIS — Z940 Kidney transplant status: Secondary | ICD-10-CM | POA: Diagnosis not present

## 2015-01-03 DIAGNOSIS — E785 Hyperlipidemia, unspecified: Secondary | ICD-10-CM | POA: Diagnosis not present

## 2015-01-03 DIAGNOSIS — Z5181 Encounter for therapeutic drug level monitoring: Secondary | ICD-10-CM | POA: Diagnosis not present

## 2015-01-03 DIAGNOSIS — I10 Essential (primary) hypertension: Secondary | ICD-10-CM | POA: Diagnosis not present

## 2015-01-03 DIAGNOSIS — Z4822 Encounter for aftercare following kidney transplant: Secondary | ICD-10-CM | POA: Diagnosis not present

## 2015-01-03 DIAGNOSIS — R609 Edema, unspecified: Secondary | ICD-10-CM | POA: Diagnosis not present

## 2015-01-03 DIAGNOSIS — Z85528 Personal history of other malignant neoplasm of kidney: Secondary | ICD-10-CM | POA: Diagnosis not present

## 2015-01-04 DIAGNOSIS — N032 Chronic nephritic syndrome with diffuse membranous glomerulonephritis: Secondary | ICD-10-CM | POA: Diagnosis not present

## 2015-01-04 DIAGNOSIS — R609 Edema, unspecified: Secondary | ICD-10-CM | POA: Diagnosis not present

## 2015-01-04 DIAGNOSIS — N183 Chronic kidney disease, stage 3 (moderate): Secondary | ICD-10-CM | POA: Diagnosis not present

## 2015-01-04 DIAGNOSIS — Z5181 Encounter for therapeutic drug level monitoring: Secondary | ICD-10-CM | POA: Diagnosis not present

## 2015-01-04 DIAGNOSIS — R809 Proteinuria, unspecified: Secondary | ICD-10-CM | POA: Diagnosis not present

## 2015-01-04 DIAGNOSIS — R0602 Shortness of breath: Secondary | ICD-10-CM | POA: Diagnosis not present

## 2015-01-04 DIAGNOSIS — Z4822 Encounter for aftercare following kidney transplant: Secondary | ICD-10-CM | POA: Diagnosis not present

## 2015-01-04 DIAGNOSIS — Z7901 Long term (current) use of anticoagulants: Secondary | ICD-10-CM | POA: Diagnosis not present

## 2015-01-04 DIAGNOSIS — D899 Disorder involving the immune mechanism, unspecified: Secondary | ICD-10-CM | POA: Diagnosis not present

## 2015-01-04 DIAGNOSIS — Z940 Kidney transplant status: Secondary | ICD-10-CM | POA: Diagnosis not present

## 2015-01-04 DIAGNOSIS — I10 Essential (primary) hypertension: Secondary | ICD-10-CM | POA: Diagnosis not present

## 2015-01-04 DIAGNOSIS — D631 Anemia in chronic kidney disease: Secondary | ICD-10-CM | POA: Diagnosis not present

## 2015-01-06 LAB — POCT INR: INR: 1.3

## 2015-01-08 ENCOUNTER — Telehealth: Payer: Self-pay | Admitting: Pharmacist

## 2015-01-08 ENCOUNTER — Ambulatory Visit (INDEPENDENT_AMBULATORY_CARE_PROVIDER_SITE_OTHER): Payer: Medicare Other | Admitting: Pharmacist

## 2015-01-08 DIAGNOSIS — Z952 Presence of prosthetic heart valve: Secondary | ICD-10-CM

## 2015-01-08 DIAGNOSIS — Z954 Presence of other heart-valve replacement: Secondary | ICD-10-CM

## 2015-01-08 DIAGNOSIS — Z5181 Encounter for therapeutic drug level monitoring: Secondary | ICD-10-CM

## 2015-01-08 DIAGNOSIS — Z7901 Long term (current) use of anticoagulants: Secondary | ICD-10-CM

## 2015-01-08 LAB — PROTIME-INR

## 2015-01-08 NOTE — Progress Notes (Signed)
Patient checks INR at home with Home INR monitoring.  Billing once per month interupertation fee.  Patient diagnosis - prosthetic heart valve  Procedure code if [id number]

## 2015-01-08 NOTE — Telephone Encounter (Signed)
Patient checked INR which was 1.6 today.  This is up since checked 2 days ago when was 1.3. Patient held warfarin last week prior to biopsy which was performed 01/04/2015.  Recommended patient take extra 1 tablet today and then resume usually warfarin dose.  Recheck INR in 1 week.

## 2015-01-10 DIAGNOSIS — R0602 Shortness of breath: Secondary | ICD-10-CM | POA: Diagnosis not present

## 2015-01-10 DIAGNOSIS — Z888 Allergy status to other drugs, medicaments and biological substances status: Secondary | ICD-10-CM | POA: Diagnosis not present

## 2015-01-10 DIAGNOSIS — R5383 Other fatigue: Secondary | ICD-10-CM | POA: Diagnosis not present

## 2015-01-10 DIAGNOSIS — Z79899 Other long term (current) drug therapy: Secondary | ICD-10-CM | POA: Diagnosis not present

## 2015-01-10 DIAGNOSIS — I2510 Atherosclerotic heart disease of native coronary artery without angina pectoris: Secondary | ICD-10-CM | POA: Diagnosis not present

## 2015-01-10 DIAGNOSIS — D899 Disorder involving the immune mechanism, unspecified: Secondary | ICD-10-CM | POA: Diagnosis not present

## 2015-01-10 DIAGNOSIS — R531 Weakness: Secondary | ICD-10-CM | POA: Diagnosis not present

## 2015-01-10 DIAGNOSIS — I120 Hypertensive chronic kidney disease with stage 5 chronic kidney disease or end stage renal disease: Secondary | ICD-10-CM | POA: Diagnosis not present

## 2015-01-10 DIAGNOSIS — D631 Anemia in chronic kidney disease: Secondary | ICD-10-CM | POA: Diagnosis not present

## 2015-01-10 DIAGNOSIS — Z4822 Encounter for aftercare following kidney transplant: Secondary | ICD-10-CM | POA: Diagnosis not present

## 2015-01-10 DIAGNOSIS — N186 End stage renal disease: Secondary | ICD-10-CM | POA: Diagnosis not present

## 2015-01-10 DIAGNOSIS — E785 Hyperlipidemia, unspecified: Secondary | ICD-10-CM | POA: Diagnosis not present

## 2015-01-10 DIAGNOSIS — R200 Anesthesia of skin: Secondary | ICD-10-CM | POA: Diagnosis not present

## 2015-01-10 DIAGNOSIS — I272 Other secondary pulmonary hypertension: Secondary | ICD-10-CM | POA: Diagnosis not present

## 2015-01-10 DIAGNOSIS — Z9861 Coronary angioplasty status: Secondary | ICD-10-CM | POA: Diagnosis not present

## 2015-01-10 DIAGNOSIS — Z940 Kidney transplant status: Secondary | ICD-10-CM | POA: Diagnosis not present

## 2015-01-10 DIAGNOSIS — Z85828 Personal history of other malignant neoplasm of skin: Secondary | ICD-10-CM | POA: Diagnosis not present

## 2015-01-10 DIAGNOSIS — Z7901 Long term (current) use of anticoagulants: Secondary | ICD-10-CM | POA: Diagnosis not present

## 2015-01-15 ENCOUNTER — Ambulatory Visit (INDEPENDENT_AMBULATORY_CARE_PROVIDER_SITE_OTHER): Payer: Medicare Other | Admitting: Pharmacist

## 2015-01-15 DIAGNOSIS — Z954 Presence of other heart-valve replacement: Secondary | ICD-10-CM

## 2015-01-15 DIAGNOSIS — Z952 Presence of prosthetic heart valve: Secondary | ICD-10-CM

## 2015-01-15 DIAGNOSIS — Z5181 Encounter for therapeutic drug level monitoring: Secondary | ICD-10-CM

## 2015-01-15 DIAGNOSIS — Z7901 Long term (current) use of anticoagulants: Secondary | ICD-10-CM

## 2015-01-17 ENCOUNTER — Ambulatory Visit (INDEPENDENT_AMBULATORY_CARE_PROVIDER_SITE_OTHER): Payer: Medicare Other | Admitting: Pharmacist

## 2015-01-17 DIAGNOSIS — Z952 Presence of prosthetic heart valve: Secondary | ICD-10-CM

## 2015-01-17 DIAGNOSIS — Z954 Presence of other heart-valve replacement: Secondary | ICD-10-CM

## 2015-01-17 DIAGNOSIS — I350 Nonrheumatic aortic (valve) stenosis: Secondary | ICD-10-CM | POA: Diagnosis not present

## 2015-01-17 DIAGNOSIS — Z5181 Encounter for therapeutic drug level monitoring: Secondary | ICD-10-CM

## 2015-01-17 DIAGNOSIS — R0600 Dyspnea, unspecified: Secondary | ICD-10-CM | POA: Diagnosis not present

## 2015-01-17 DIAGNOSIS — Z7901 Long term (current) use of anticoagulants: Secondary | ICD-10-CM

## 2015-01-17 LAB — POCT INR: INR: 1.7

## 2015-01-17 LAB — PROTIME-INR

## 2015-01-17 NOTE — Progress Notes (Signed)
INR results faxed from home INR monitoring Jeremy [name] No charge for labs or visit today

## 2015-01-17 NOTE — Progress Notes (Signed)
erranous encournter

## 2015-01-22 DIAGNOSIS — Z85528 Personal history of other malignant neoplasm of kidney: Secondary | ICD-10-CM | POA: Diagnosis not present

## 2015-01-22 DIAGNOSIS — D649 Anemia, unspecified: Secondary | ICD-10-CM | POA: Diagnosis not present

## 2015-01-22 DIAGNOSIS — Z7901 Long term (current) use of anticoagulants: Secondary | ICD-10-CM | POA: Diagnosis not present

## 2015-01-22 DIAGNOSIS — Z952 Presence of prosthetic heart valve: Secondary | ICD-10-CM | POA: Diagnosis not present

## 2015-01-22 DIAGNOSIS — D508 Other iron deficiency anemias: Secondary | ICD-10-CM | POA: Diagnosis not present

## 2015-01-22 DIAGNOSIS — Z955 Presence of coronary angioplasty implant and graft: Secondary | ICD-10-CM | POA: Diagnosis not present

## 2015-01-22 DIAGNOSIS — R200 Anesthesia of skin: Secondary | ICD-10-CM | POA: Diagnosis not present

## 2015-01-22 DIAGNOSIS — I120 Hypertensive chronic kidney disease with stage 5 chronic kidney disease or end stage renal disease: Secondary | ICD-10-CM | POA: Diagnosis not present

## 2015-01-22 DIAGNOSIS — D899 Disorder involving the immune mechanism, unspecified: Secondary | ICD-10-CM | POA: Diagnosis not present

## 2015-01-22 DIAGNOSIS — I2510 Atherosclerotic heart disease of native coronary artery without angina pectoris: Secondary | ICD-10-CM | POA: Diagnosis not present

## 2015-01-22 DIAGNOSIS — I200 Unstable angina: Secondary | ICD-10-CM | POA: Diagnosis not present

## 2015-01-22 DIAGNOSIS — Z9861 Coronary angioplasty status: Secondary | ICD-10-CM | POA: Diagnosis not present

## 2015-01-22 DIAGNOSIS — Z4822 Encounter for aftercare following kidney transplant: Secondary | ICD-10-CM | POA: Diagnosis not present

## 2015-01-22 DIAGNOSIS — K439 Ventral hernia without obstruction or gangrene: Secondary | ICD-10-CM | POA: Diagnosis not present

## 2015-01-22 DIAGNOSIS — E785 Hyperlipidemia, unspecified: Secondary | ICD-10-CM | POA: Diagnosis not present

## 2015-01-22 DIAGNOSIS — Z940 Kidney transplant status: Secondary | ICD-10-CM | POA: Diagnosis not present

## 2015-01-22 DIAGNOSIS — M25519 Pain in unspecified shoulder: Secondary | ICD-10-CM | POA: Diagnosis not present

## 2015-01-22 DIAGNOSIS — N2581 Secondary hyperparathyroidism of renal origin: Secondary | ICD-10-CM | POA: Diagnosis not present

## 2015-01-22 DIAGNOSIS — R1030 Lower abdominal pain, unspecified: Secondary | ICD-10-CM | POA: Diagnosis not present

## 2015-01-22 DIAGNOSIS — Z79899 Other long term (current) drug therapy: Secondary | ICD-10-CM | POA: Diagnosis not present

## 2015-01-22 DIAGNOSIS — N186 End stage renal disease: Secondary | ICD-10-CM | POA: Diagnosis not present

## 2015-01-22 DIAGNOSIS — M4800 Spinal stenosis, site unspecified: Secondary | ICD-10-CM | POA: Diagnosis not present

## 2015-01-25 ENCOUNTER — Ambulatory Visit (INDEPENDENT_AMBULATORY_CARE_PROVIDER_SITE_OTHER): Payer: Medicare Other | Admitting: Pharmacist

## 2015-01-25 DIAGNOSIS — Z954 Presence of other heart-valve replacement: Secondary | ICD-10-CM

## 2015-01-25 DIAGNOSIS — Z5181 Encounter for therapeutic drug level monitoring: Secondary | ICD-10-CM

## 2015-01-25 DIAGNOSIS — Z7901 Long term (current) use of anticoagulants: Secondary | ICD-10-CM

## 2015-01-25 DIAGNOSIS — Z952 Presence of prosthetic heart valve: Secondary | ICD-10-CM

## 2015-01-25 LAB — POCT INR: INR: 1.8

## 2015-01-25 NOTE — Progress Notes (Signed)
No charge for labs or visit - INR check through home monitoring system  

## 2015-01-29 DIAGNOSIS — D631 Anemia in chronic kidney disease: Secondary | ICD-10-CM | POA: Diagnosis not present

## 2015-01-29 DIAGNOSIS — N183 Chronic kidney disease, stage 3 (moderate): Secondary | ICD-10-CM | POA: Diagnosis not present

## 2015-01-30 ENCOUNTER — Encounter: Payer: Self-pay | Admitting: Family Medicine

## 2015-02-01 DIAGNOSIS — E785 Hyperlipidemia, unspecified: Secondary | ICD-10-CM | POA: Diagnosis not present

## 2015-02-01 DIAGNOSIS — Z7901 Long term (current) use of anticoagulants: Secondary | ICD-10-CM | POA: Diagnosis not present

## 2015-02-01 DIAGNOSIS — I272 Other secondary pulmonary hypertension: Secondary | ICD-10-CM | POA: Diagnosis not present

## 2015-02-01 DIAGNOSIS — Z7982 Long term (current) use of aspirin: Secondary | ICD-10-CM | POA: Diagnosis not present

## 2015-02-01 DIAGNOSIS — I2510 Atherosclerotic heart disease of native coronary artery without angina pectoris: Secondary | ICD-10-CM | POA: Diagnosis not present

## 2015-02-01 DIAGNOSIS — Z940 Kidney transplant status: Secondary | ICD-10-CM | POA: Diagnosis not present

## 2015-02-01 DIAGNOSIS — D631 Anemia in chronic kidney disease: Secondary | ICD-10-CM | POA: Diagnosis not present

## 2015-02-01 DIAGNOSIS — I120 Hypertensive chronic kidney disease with stage 5 chronic kidney disease or end stage renal disease: Secondary | ICD-10-CM | POA: Diagnosis not present

## 2015-02-01 DIAGNOSIS — N059 Unspecified nephritic syndrome with unspecified morphologic changes: Secondary | ICD-10-CM | POA: Diagnosis not present

## 2015-02-01 DIAGNOSIS — N186 End stage renal disease: Secondary | ICD-10-CM | POA: Diagnosis not present

## 2015-02-01 DIAGNOSIS — E8779 Other fluid overload: Secondary | ICD-10-CM | POA: Diagnosis not present

## 2015-02-01 DIAGNOSIS — Z7952 Long term (current) use of systemic steroids: Secondary | ICD-10-CM | POA: Diagnosis not present

## 2015-02-01 DIAGNOSIS — Z792 Long term (current) use of antibiotics: Secondary | ICD-10-CM | POA: Diagnosis not present

## 2015-02-01 DIAGNOSIS — Z79899 Other long term (current) drug therapy: Secondary | ICD-10-CM | POA: Diagnosis not present

## 2015-02-02 LAB — POCT INR: INR: 1.8

## 2015-02-05 ENCOUNTER — Ambulatory Visit (INDEPENDENT_AMBULATORY_CARE_PROVIDER_SITE_OTHER): Payer: Medicare Other | Admitting: Pharmacist

## 2015-02-05 DIAGNOSIS — Z952 Presence of prosthetic heart valve: Secondary | ICD-10-CM

## 2015-02-05 DIAGNOSIS — Z5181 Encounter for therapeutic drug level monitoring: Secondary | ICD-10-CM

## 2015-02-05 DIAGNOSIS — Z954 Presence of other heart-valve replacement: Secondary | ICD-10-CM

## 2015-02-05 DIAGNOSIS — Z7901 Long term (current) use of anticoagulants: Secondary | ICD-10-CM

## 2015-02-09 ENCOUNTER — Telehealth: Payer: Self-pay | Admitting: *Deleted

## 2015-02-09 NOTE — Telephone Encounter (Signed)
Patient aware results 

## 2015-02-09 NOTE — Telephone Encounter (Signed)
Patients INR 2.0

## 2015-02-09 NOTE — Telephone Encounter (Signed)
If patient is on Coumadin have him continue the current treatment regimen and recheck pro time routinely and 2 weeks

## 2015-02-12 DIAGNOSIS — D899 Disorder involving the immune mechanism, unspecified: Secondary | ICD-10-CM | POA: Diagnosis not present

## 2015-02-12 DIAGNOSIS — I350 Nonrheumatic aortic (valve) stenosis: Secondary | ICD-10-CM | POA: Diagnosis not present

## 2015-02-12 DIAGNOSIS — R809 Proteinuria, unspecified: Secondary | ICD-10-CM | POA: Diagnosis not present

## 2015-02-12 DIAGNOSIS — Z952 Presence of prosthetic heart valve: Secondary | ICD-10-CM | POA: Diagnosis not present

## 2015-02-12 DIAGNOSIS — R600 Localized edema: Secondary | ICD-10-CM | POA: Diagnosis not present

## 2015-02-12 DIAGNOSIS — N2581 Secondary hyperparathyroidism of renal origin: Secondary | ICD-10-CM | POA: Diagnosis not present

## 2015-02-12 DIAGNOSIS — I10 Essential (primary) hypertension: Secondary | ICD-10-CM | POA: Diagnosis not present

## 2015-02-12 DIAGNOSIS — I2510 Atherosclerotic heart disease of native coronary artery without angina pectoris: Secondary | ICD-10-CM | POA: Diagnosis not present

## 2015-02-12 DIAGNOSIS — Z940 Kidney transplant status: Secondary | ICD-10-CM | POA: Diagnosis not present

## 2015-02-15 DIAGNOSIS — N186 End stage renal disease: Secondary | ICD-10-CM | POA: Diagnosis not present

## 2015-02-15 DIAGNOSIS — Z940 Kidney transplant status: Secondary | ICD-10-CM | POA: Diagnosis not present

## 2015-02-17 LAB — POCT INR: INR: 3.8

## 2015-02-19 ENCOUNTER — Ambulatory Visit (INDEPENDENT_AMBULATORY_CARE_PROVIDER_SITE_OTHER): Payer: Medicare Other | Admitting: Pharmacist

## 2015-02-19 DIAGNOSIS — Z952 Presence of prosthetic heart valve: Secondary | ICD-10-CM

## 2015-02-19 DIAGNOSIS — Z5181 Encounter for therapeutic drug level monitoring: Secondary | ICD-10-CM

## 2015-02-19 DIAGNOSIS — Z954 Presence of other heart-valve replacement: Secondary | ICD-10-CM

## 2015-02-19 DIAGNOSIS — Z7901 Long term (current) use of anticoagulants: Secondary | ICD-10-CM

## 2015-02-19 NOTE — Progress Notes (Signed)
No charge for labs or visit - INR check through home monitoring system  

## 2015-02-21 ENCOUNTER — Telehealth: Payer: Self-pay | Admitting: Family Medicine

## 2015-02-21 NOTE — Telephone Encounter (Signed)
Spoke with patient. He had flu shot at [facility] in September. Records reviewed and immunization documented.

## 2015-02-25 DIAGNOSIS — Z954 Presence of other heart-valve replacement: Secondary | ICD-10-CM | POA: Diagnosis not present

## 2015-02-25 DIAGNOSIS — Z7901 Long term (current) use of anticoagulants: Secondary | ICD-10-CM | POA: Diagnosis not present

## 2015-02-25 LAB — POCT INR: INR: 1.9

## 2015-02-26 ENCOUNTER — Ambulatory Visit (INDEPENDENT_AMBULATORY_CARE_PROVIDER_SITE_OTHER): Payer: Medicare Other | Admitting: Pharmacist

## 2015-02-26 DIAGNOSIS — M461 Sacroiliitis, not elsewhere classified: Secondary | ICD-10-CM | POA: Diagnosis not present

## 2015-02-26 DIAGNOSIS — D631 Anemia in chronic kidney disease: Secondary | ICD-10-CM | POA: Diagnosis not present

## 2015-02-26 DIAGNOSIS — Z792 Long term (current) use of antibiotics: Secondary | ICD-10-CM | POA: Diagnosis not present

## 2015-02-26 DIAGNOSIS — E872 Acidosis: Secondary | ICD-10-CM | POA: Diagnosis not present

## 2015-02-26 DIAGNOSIS — Z09 Encounter for follow-up examination after completed treatment for conditions other than malignant neoplasm: Secondary | ICD-10-CM | POA: Diagnosis not present

## 2015-02-26 DIAGNOSIS — Z7901 Long term (current) use of anticoagulants: Secondary | ICD-10-CM | POA: Diagnosis not present

## 2015-02-26 DIAGNOSIS — Z79899 Other long term (current) drug therapy: Secondary | ICD-10-CM | POA: Diagnosis not present

## 2015-02-26 DIAGNOSIS — Z952 Presence of prosthetic heart valve: Secondary | ICD-10-CM

## 2015-02-26 DIAGNOSIS — R609 Edema, unspecified: Secondary | ICD-10-CM | POA: Diagnosis not present

## 2015-02-26 DIAGNOSIS — I120 Hypertensive chronic kidney disease with stage 5 chronic kidney disease or end stage renal disease: Secondary | ICD-10-CM | POA: Diagnosis not present

## 2015-02-26 DIAGNOSIS — N2581 Secondary hyperparathyroidism of renal origin: Secondary | ICD-10-CM | POA: Diagnosis not present

## 2015-02-26 DIAGNOSIS — Z5181 Encounter for therapeutic drug level monitoring: Secondary | ICD-10-CM

## 2015-02-26 DIAGNOSIS — N186 End stage renal disease: Secondary | ICD-10-CM | POA: Diagnosis not present

## 2015-02-26 DIAGNOSIS — C469 Kaposi's sarcoma, unspecified: Secondary | ICD-10-CM | POA: Diagnosis not present

## 2015-02-26 DIAGNOSIS — Z940 Kidney transplant status: Secondary | ICD-10-CM | POA: Diagnosis not present

## 2015-02-26 DIAGNOSIS — D899 Disorder involving the immune mechanism, unspecified: Secondary | ICD-10-CM | POA: Diagnosis not present

## 2015-02-26 DIAGNOSIS — C649 Malignant neoplasm of unspecified kidney, except renal pelvis: Secondary | ICD-10-CM | POA: Diagnosis not present

## 2015-02-26 DIAGNOSIS — Z954 Presence of other heart-valve replacement: Secondary | ICD-10-CM

## 2015-02-26 DIAGNOSIS — E785 Hyperlipidemia, unspecified: Secondary | ICD-10-CM | POA: Diagnosis not present

## 2015-02-26 DIAGNOSIS — I359 Nonrheumatic aortic valve disorder, unspecified: Secondary | ICD-10-CM | POA: Diagnosis not present

## 2015-03-07 ENCOUNTER — Ambulatory Visit (INDEPENDENT_AMBULATORY_CARE_PROVIDER_SITE_OTHER): Payer: Medicare Other | Admitting: Pharmacist

## 2015-03-07 DIAGNOSIS — Z952 Presence of prosthetic heart valve: Secondary | ICD-10-CM

## 2015-03-07 DIAGNOSIS — Z5181 Encounter for therapeutic drug level monitoring: Secondary | ICD-10-CM

## 2015-03-07 DIAGNOSIS — Z954 Presence of other heart-valve replacement: Secondary | ICD-10-CM

## 2015-03-07 DIAGNOSIS — Z7901 Long term (current) use of anticoagulants: Secondary | ICD-10-CM

## 2015-03-07 LAB — POCT INR: INR: 2.7

## 2015-03-08 ENCOUNTER — Encounter: Payer: Self-pay | Admitting: Family Medicine

## 2015-03-12 DIAGNOSIS — Z940 Kidney transplant status: Secondary | ICD-10-CM | POA: Diagnosis not present

## 2015-03-12 DIAGNOSIS — N184 Chronic kidney disease, stage 4 (severe): Secondary | ICD-10-CM | POA: Diagnosis not present

## 2015-03-12 DIAGNOSIS — D631 Anemia in chronic kidney disease: Secondary | ICD-10-CM | POA: Diagnosis not present

## 2015-03-14 LAB — POCT INR: INR: 2.8

## 2015-03-15 ENCOUNTER — Ambulatory Visit (INDEPENDENT_AMBULATORY_CARE_PROVIDER_SITE_OTHER): Payer: Medicare Other | Admitting: Pharmacist

## 2015-03-15 ENCOUNTER — Encounter: Payer: Medicare Other | Admitting: Pharmacist

## 2015-03-15 DIAGNOSIS — Z954 Presence of other heart-valve replacement: Secondary | ICD-10-CM

## 2015-03-15 DIAGNOSIS — Z5181 Encounter for therapeutic drug level monitoring: Secondary | ICD-10-CM | POA: Diagnosis not present

## 2015-03-15 DIAGNOSIS — Z7901 Long term (current) use of anticoagulants: Secondary | ICD-10-CM

## 2015-03-15 DIAGNOSIS — Z952 Presence of prosthetic heart valve: Secondary | ICD-10-CM

## 2015-03-15 NOTE — Progress Notes (Signed)
Patient checks INR at home with Home INR monitoring.  Billing once per month interupertation fee.  Patient diagnosis - history of aortic heart valve replacement Procedure code if G0250

## 2015-03-22 LAB — POCT INR: INR: 2.5

## 2015-03-23 ENCOUNTER — Ambulatory Visit (INDEPENDENT_AMBULATORY_CARE_PROVIDER_SITE_OTHER): Payer: 59 | Admitting: Pharmacist

## 2015-03-23 DIAGNOSIS — Z7901 Long term (current) use of anticoagulants: Secondary | ICD-10-CM

## 2015-03-23 DIAGNOSIS — Z954 Presence of other heart-valve replacement: Secondary | ICD-10-CM

## 2015-03-23 DIAGNOSIS — Z5181 Encounter for therapeutic drug level monitoring: Secondary | ICD-10-CM

## 2015-03-23 DIAGNOSIS — Z952 Presence of prosthetic heart valve: Secondary | ICD-10-CM

## 2015-03-23 NOTE — Progress Notes (Signed)
No charge for labs or visit - INR check through home monitoring system  

## 2015-03-26 DIAGNOSIS — Z85528 Personal history of other malignant neoplasm of kidney: Secondary | ICD-10-CM | POA: Diagnosis not present

## 2015-03-26 DIAGNOSIS — R200 Anesthesia of skin: Secondary | ICD-10-CM | POA: Diagnosis not present

## 2015-03-26 DIAGNOSIS — Z888 Allergy status to other drugs, medicaments and biological substances status: Secondary | ICD-10-CM | POA: Diagnosis not present

## 2015-03-26 DIAGNOSIS — Z4822 Encounter for aftercare following kidney transplant: Secondary | ICD-10-CM | POA: Diagnosis not present

## 2015-03-26 DIAGNOSIS — Z905 Acquired absence of kidney: Secondary | ICD-10-CM | POA: Diagnosis not present

## 2015-03-26 DIAGNOSIS — R809 Proteinuria, unspecified: Secondary | ICD-10-CM | POA: Diagnosis not present

## 2015-03-26 DIAGNOSIS — N186 End stage renal disease: Secondary | ICD-10-CM | POA: Diagnosis not present

## 2015-03-26 DIAGNOSIS — R5383 Other fatigue: Secondary | ICD-10-CM | POA: Diagnosis not present

## 2015-03-26 DIAGNOSIS — D899 Disorder involving the immune mechanism, unspecified: Secondary | ICD-10-CM | POA: Diagnosis not present

## 2015-03-26 DIAGNOSIS — E8770 Fluid overload, unspecified: Secondary | ICD-10-CM | POA: Diagnosis not present

## 2015-03-26 DIAGNOSIS — E785 Hyperlipidemia, unspecified: Secondary | ICD-10-CM | POA: Diagnosis not present

## 2015-03-26 DIAGNOSIS — E1122 Type 2 diabetes mellitus with diabetic chronic kidney disease: Secondary | ICD-10-CM | POA: Diagnosis not present

## 2015-03-26 DIAGNOSIS — E872 Acidosis: Secondary | ICD-10-CM | POA: Diagnosis not present

## 2015-03-26 DIAGNOSIS — R202 Paresthesia of skin: Secondary | ICD-10-CM | POA: Diagnosis not present

## 2015-03-26 DIAGNOSIS — R0982 Postnasal drip: Secondary | ICD-10-CM | POA: Diagnosis not present

## 2015-03-26 DIAGNOSIS — Z7901 Long term (current) use of anticoagulants: Secondary | ICD-10-CM | POA: Diagnosis not present

## 2015-03-26 DIAGNOSIS — Z952 Presence of prosthetic heart valve: Secondary | ICD-10-CM | POA: Diagnosis not present

## 2015-03-26 DIAGNOSIS — I2510 Atherosclerotic heart disease of native coronary artery without angina pectoris: Secondary | ICD-10-CM | POA: Diagnosis not present

## 2015-03-26 DIAGNOSIS — Z940 Kidney transplant status: Secondary | ICD-10-CM | POA: Diagnosis not present

## 2015-03-26 DIAGNOSIS — M25511 Pain in right shoulder: Secondary | ICD-10-CM | POA: Diagnosis not present

## 2015-03-26 DIAGNOSIS — Z79899 Other long term (current) drug therapy: Secondary | ICD-10-CM | POA: Diagnosis not present

## 2015-03-26 DIAGNOSIS — I120 Hypertensive chronic kidney disease with stage 5 chronic kidney disease or end stage renal disease: Secondary | ICD-10-CM | POA: Diagnosis not present

## 2015-03-26 DIAGNOSIS — D649 Anemia, unspecified: Secondary | ICD-10-CM | POA: Diagnosis not present

## 2015-03-30 ENCOUNTER — Ambulatory Visit (INDEPENDENT_AMBULATORY_CARE_PROVIDER_SITE_OTHER): Payer: 59 | Admitting: Pharmacist

## 2015-03-30 DIAGNOSIS — Z954 Presence of other heart-valve replacement: Secondary | ICD-10-CM

## 2015-03-30 DIAGNOSIS — Z7901 Long term (current) use of anticoagulants: Secondary | ICD-10-CM

## 2015-03-30 DIAGNOSIS — Z5181 Encounter for therapeutic drug level monitoring: Secondary | ICD-10-CM

## 2015-03-30 DIAGNOSIS — Z952 Presence of prosthetic heart valve: Secondary | ICD-10-CM

## 2015-03-30 LAB — POCT INR: INR: 1.9

## 2015-03-30 NOTE — Progress Notes (Signed)
No charge for labs or visit - INR check through home monitoring system  

## 2015-04-04 LAB — POCT INR: INR: 2.4

## 2015-04-05 ENCOUNTER — Ambulatory Visit (INDEPENDENT_AMBULATORY_CARE_PROVIDER_SITE_OTHER): Payer: 59 | Admitting: Pharmacist

## 2015-04-05 DIAGNOSIS — Z5181 Encounter for therapeutic drug level monitoring: Secondary | ICD-10-CM

## 2015-04-05 DIAGNOSIS — Z7901 Long term (current) use of anticoagulants: Secondary | ICD-10-CM

## 2015-04-05 DIAGNOSIS — Z954 Presence of other heart-valve replacement: Secondary | ICD-10-CM

## 2015-04-05 DIAGNOSIS — Z952 Presence of prosthetic heart valve: Secondary | ICD-10-CM

## 2015-04-05 NOTE — Progress Notes (Signed)
No charge for labs or visit - INR check through home monitoring system  

## 2015-04-12 ENCOUNTER — Telehealth: Payer: Self-pay | Admitting: Pharmacist

## 2015-04-12 ENCOUNTER — Ambulatory Visit (INDEPENDENT_AMBULATORY_CARE_PROVIDER_SITE_OTHER): Payer: 59 | Admitting: Pharmacist

## 2015-04-12 DIAGNOSIS — Z954 Presence of other heart-valve replacement: Secondary | ICD-10-CM | POA: Diagnosis not present

## 2015-04-12 DIAGNOSIS — Z952 Presence of prosthetic heart valve: Secondary | ICD-10-CM | POA: Diagnosis not present

## 2015-04-12 DIAGNOSIS — Z7901 Long term (current) use of anticoagulants: Secondary | ICD-10-CM

## 2015-04-12 DIAGNOSIS — Z5181 Encounter for therapeutic drug level monitoring: Secondary | ICD-10-CM

## 2015-04-12 DIAGNOSIS — D631 Anemia in chronic kidney disease: Secondary | ICD-10-CM | POA: Diagnosis not present

## 2015-04-12 DIAGNOSIS — N184 Chronic kidney disease, stage 4 (severe): Secondary | ICD-10-CM | POA: Diagnosis not present

## 2015-04-12 LAB — POCT INR: INR: 2.5

## 2015-04-12 NOTE — Progress Notes (Signed)
Patient checks INR at home with Home INR monitoring.  Billing once per month interupertation fee.  Patient diagnosis - Z95.2 presence of prosthetic heart valve Procedure code if [id number]

## 2015-04-12 NOTE — Telephone Encounter (Signed)
Pt called to inquire requests that have been made for CPAP supplies from [employer].  He states [employer] has sent requests twice without response.  I will forward to Barnett Applebaum to follow up with [employer] on request.  I did mention to patient that this might be something that will require a follow up visit to complete paperwork.

## 2015-04-16 NOTE — Telephone Encounter (Signed)
Contacted [employer], they will contact pt and let him know what is needed.

## 2015-04-20 ENCOUNTER — Ambulatory Visit (INDEPENDENT_AMBULATORY_CARE_PROVIDER_SITE_OTHER): Payer: 59 | Admitting: Pharmacist

## 2015-04-20 DIAGNOSIS — Z954 Presence of other heart-valve replacement: Secondary | ICD-10-CM

## 2015-04-20 DIAGNOSIS — Z5181 Encounter for therapeutic drug level monitoring: Secondary | ICD-10-CM

## 2015-04-20 DIAGNOSIS — Z952 Presence of prosthetic heart valve: Secondary | ICD-10-CM

## 2015-04-20 DIAGNOSIS — Z7901 Long term (current) use of anticoagulants: Secondary | ICD-10-CM

## 2015-04-20 LAB — POCT INR: INR: 2.6

## 2015-04-24 ENCOUNTER — Encounter: Payer: Self-pay | Admitting: Family Medicine

## 2015-04-24 ENCOUNTER — Telehealth: Payer: Self-pay

## 2015-04-24 ENCOUNTER — Ambulatory Visit (INDEPENDENT_AMBULATORY_CARE_PROVIDER_SITE_OTHER): Payer: Medicare Other

## 2015-04-24 ENCOUNTER — Ambulatory Visit (INDEPENDENT_AMBULATORY_CARE_PROVIDER_SITE_OTHER): Payer: Medicare Other | Admitting: Family Medicine

## 2015-04-24 VITALS — BP 149/58 | HR 64 | Temp 97.9°F | Ht 69.5 in | Wt 191.5 lb

## 2015-04-24 DIAGNOSIS — M10471 Other secondary gout, right ankle and foot: Secondary | ICD-10-CM

## 2015-04-24 DIAGNOSIS — M109 Gout, unspecified: Secondary | ICD-10-CM | POA: Insufficient documentation

## 2015-04-24 MED ORDER — PREDNISONE 50 MG PO TABS: ORAL_TABLET | ORAL | Status: DC

## 2015-04-24 NOTE — Telephone Encounter (Signed)
Stp and advised of md feedback and pt voiced understanding.

## 2015-04-24 NOTE — Patient Instructions (Signed)
Great to see you!  I have sent prednisone for you for 7 days  Let your nephrologist know that I have ordered a burst of prednisone for 1 week  We will get your x ray as soon as we can.

## 2015-04-24 NOTE — Progress Notes (Signed)
   HPI  Patient presents today here for a great toe pain.  Patient explains that starting Sunday morning he woke up with a warm, red, swollen great toe. He states that it feels very similar to gout attack that he had 20 years ago.  He states that he did stop his toe about 2 months ago that had complete resolution of pain and bruising at bedtime. He has no other traumatic events.  He feels well, denies fever, chills, sweats, or other concerns.  He has been trying follow-up on some CPAP paperwork as well He has had OSA for 20+ years and has used a CPAP for the same amount of time. He states that he uses it every night and cannot sleep without it. He gets very good benefit from the machine   PMH: Smoking status noted ROS: Per HPI  Objective: BP 149/58 mmHg  Pulse 64  Temp(Src) 97.9 F (36.6 C) (Oral)  Ht 5' 9.5" (1.765 m)  Wt 191 lb 8 oz (86.864 kg)  BMI 27.88 kg/m2 Gen: NAD, alert, cooperative with exam HEENT: NCAT Ext: No edema, warm Neuro: Alert and oriented, No gross deficits   DG R foot- Sclerosis at teh first MTP, no acute findings  Assessment and plan:  # Great toe pain, Gout attack Likely gout attack, possibly secondary to CKD,  His last creatinine was 2.42, his GFR was 28 Considering trauma ruled out fracture with plain film- awaiting read by radiology.   # OSA Has long history of obstructive sleep apnea and feels completely dependent on CPAP machine. He uses it every night and gets great benefit from it.    Orders Placed This Encounter  Procedures  . DG Foot Complete Right    Standing Status: Future     Number of Occurrences:      Standing Expiration Date: 06/21/2016    Order Specific Question:  Reason for Exam (SYMPTOM  OR DIAGNOSIS REQUIRED)    Answer:  rule out fracture, pain after hiting toe 2 months ago, likely gout    Order Specific Question:  Preferred imaging location?    Answer:  Internal    Meds ordered this encounter  Medications  .  predniSONE (DELTASONE) 50 MG tablet    Sig: Take 1 pill daily for 7 days    Dispense:  7 tablet    Refill:  0    Laroy Apple, MD [facility] 04/24/2015, 1:24 PM

## 2015-04-24 NOTE — Telephone Encounter (Signed)
7 days of 50 mg prednisone. Will ask nursing to clarify.   Laroy Apple, MD [facility] 04/24/2015, 3:53 PM

## 2015-04-24 NOTE — Telephone Encounter (Signed)
Pt was seen today and has some confusion on his prednisone 50 mg. Pt states that he was under the impression that he was supposed to taper off the prednisone but the directions on the bottle says " take 1 tablet daily or 7 days". Please advise

## 2015-04-26 ENCOUNTER — Telehealth: Payer: Self-pay | Admitting: *Deleted

## 2015-04-26 DIAGNOSIS — Z7901 Long term (current) use of anticoagulants: Secondary | ICD-10-CM | POA: Diagnosis not present

## 2015-04-26 DIAGNOSIS — Z954 Presence of other heart-valve replacement: Secondary | ICD-10-CM | POA: Diagnosis not present

## 2015-04-26 NOTE — Telephone Encounter (Signed)
Called to discuss INR  Goal 2-3 for AVR  3.3 today Recently started aranesp and prednisone No missed or extra doses  Currently he is taking 3 mg on Sunday and Wednesday and 2 mg on every other day, total dose is 16 mg weekly  Hold dose X 1 day Decrease to 2 mg daily for new total dose for new total of 14 mg  Re-check 1 week  Laroy Apple, MD [facility] 04/26/2015, 11:31 AM

## 2015-04-26 NOTE — Telephone Encounter (Signed)
INR 3.3 Tammy is not here today. Please call pt with new directions

## 2015-04-27 ENCOUNTER — Encounter: Payer: Self-pay | Admitting: Family Medicine

## 2015-04-30 DIAGNOSIS — Z79899 Other long term (current) drug therapy: Secondary | ICD-10-CM | POA: Diagnosis not present

## 2015-04-30 DIAGNOSIS — I10 Essential (primary) hypertension: Secondary | ICD-10-CM | POA: Diagnosis not present

## 2015-04-30 DIAGNOSIS — I2510 Atherosclerotic heart disease of native coronary artery without angina pectoris: Secondary | ICD-10-CM | POA: Diagnosis not present

## 2015-04-30 DIAGNOSIS — Z9861 Coronary angioplasty status: Secondary | ICD-10-CM | POA: Diagnosis not present

## 2015-04-30 DIAGNOSIS — N189 Chronic kidney disease, unspecified: Secondary | ICD-10-CM | POA: Diagnosis not present

## 2015-04-30 DIAGNOSIS — D631 Anemia in chronic kidney disease: Secondary | ICD-10-CM | POA: Diagnosis not present

## 2015-04-30 DIAGNOSIS — E872 Acidosis: Secondary | ICD-10-CM | POA: Diagnosis not present

## 2015-04-30 DIAGNOSIS — I129 Hypertensive chronic kidney disease with stage 1 through stage 4 chronic kidney disease, or unspecified chronic kidney disease: Secondary | ICD-10-CM | POA: Diagnosis not present

## 2015-04-30 DIAGNOSIS — Z940 Kidney transplant status: Secondary | ICD-10-CM | POA: Diagnosis not present

## 2015-04-30 DIAGNOSIS — E559 Vitamin D deficiency, unspecified: Secondary | ICD-10-CM | POA: Diagnosis not present

## 2015-04-30 DIAGNOSIS — Z952 Presence of prosthetic heart valve: Secondary | ICD-10-CM | POA: Diagnosis not present

## 2015-04-30 DIAGNOSIS — Z905 Acquired absence of kidney: Secondary | ICD-10-CM | POA: Diagnosis not present

## 2015-04-30 DIAGNOSIS — Z85528 Personal history of other malignant neoplasm of kidney: Secondary | ICD-10-CM | POA: Diagnosis not present

## 2015-04-30 DIAGNOSIS — N2581 Secondary hyperparathyroidism of renal origin: Secondary | ICD-10-CM | POA: Diagnosis not present

## 2015-04-30 DIAGNOSIS — K439 Ventral hernia without obstruction or gangrene: Secondary | ICD-10-CM | POA: Diagnosis not present

## 2015-04-30 DIAGNOSIS — Z4822 Encounter for aftercare following kidney transplant: Secondary | ICD-10-CM | POA: Diagnosis not present

## 2015-04-30 DIAGNOSIS — E785 Hyperlipidemia, unspecified: Secondary | ICD-10-CM | POA: Diagnosis not present

## 2015-04-30 DIAGNOSIS — Z7901 Long term (current) use of anticoagulants: Secondary | ICD-10-CM | POA: Diagnosis not present

## 2015-04-30 DIAGNOSIS — D899 Disorder involving the immune mechanism, unspecified: Secondary | ICD-10-CM | POA: Diagnosis not present

## 2015-04-30 DIAGNOSIS — Z862 Personal history of diseases of the blood and blood-forming organs and certain disorders involving the immune mechanism: Secondary | ICD-10-CM | POA: Diagnosis not present

## 2015-04-30 DIAGNOSIS — M109 Gout, unspecified: Secondary | ICD-10-CM | POA: Diagnosis not present

## 2015-05-02 ENCOUNTER — Ambulatory Visit (INDEPENDENT_AMBULATORY_CARE_PROVIDER_SITE_OTHER): Payer: 59 | Admitting: Pharmacist

## 2015-05-02 DIAGNOSIS — Z5181 Encounter for therapeutic drug level monitoring: Secondary | ICD-10-CM

## 2015-05-02 DIAGNOSIS — Z954 Presence of other heart-valve replacement: Secondary | ICD-10-CM

## 2015-05-02 DIAGNOSIS — Z952 Presence of prosthetic heart valve: Secondary | ICD-10-CM

## 2015-05-02 DIAGNOSIS — Z7901 Long term (current) use of anticoagulants: Secondary | ICD-10-CM

## 2015-05-02 LAB — POCT INR: INR: 1.9

## 2015-05-09 ENCOUNTER — Ambulatory Visit (INDEPENDENT_AMBULATORY_CARE_PROVIDER_SITE_OTHER): Payer: 59 | Admitting: Pharmacist

## 2015-05-09 DIAGNOSIS — Z7901 Long term (current) use of anticoagulants: Secondary | ICD-10-CM

## 2015-05-09 DIAGNOSIS — Z954 Presence of other heart-valve replacement: Secondary | ICD-10-CM | POA: Diagnosis not present

## 2015-05-09 DIAGNOSIS — Z952 Presence of prosthetic heart valve: Secondary | ICD-10-CM

## 2015-05-09 DIAGNOSIS — Z5181 Encounter for therapeutic drug level monitoring: Secondary | ICD-10-CM | POA: Diagnosis not present

## 2015-05-09 LAB — POCT INR: INR: 2.9

## 2015-05-09 NOTE — Progress Notes (Signed)
Patient checks INR at home with Home INR monitoring.  Billing once per month interupertation fee.  Patient diagnosis - prosthetic heart valve. Procedure code if [id number]

## 2015-05-14 DIAGNOSIS — D631 Anemia in chronic kidney disease: Secondary | ICD-10-CM | POA: Diagnosis not present

## 2015-05-14 DIAGNOSIS — N183 Chronic kidney disease, stage 3 (moderate): Secondary | ICD-10-CM | POA: Diagnosis not present

## 2015-05-15 LAB — POCT INR: INR: 3.5

## 2015-05-16 ENCOUNTER — Other Ambulatory Visit: Payer: Self-pay | Admitting: Pharmacist

## 2015-05-16 ENCOUNTER — Ambulatory Visit (INDEPENDENT_AMBULATORY_CARE_PROVIDER_SITE_OTHER): Payer: Self-pay | Admitting: Pharmacist

## 2015-05-16 DIAGNOSIS — Z5181 Encounter for therapeutic drug level monitoring: Secondary | ICD-10-CM

## 2015-05-16 DIAGNOSIS — Z954 Presence of other heart-valve replacement: Secondary | ICD-10-CM

## 2015-05-16 DIAGNOSIS — Z952 Presence of prosthetic heart valve: Secondary | ICD-10-CM

## 2015-05-16 DIAGNOSIS — Z7901 Long term (current) use of anticoagulants: Secondary | ICD-10-CM

## 2015-05-16 NOTE — Progress Notes (Signed)
No charge for labs or visit - INR check through home monitoring system  

## 2015-05-23 ENCOUNTER — Ambulatory Visit (INDEPENDENT_AMBULATORY_CARE_PROVIDER_SITE_OTHER): Payer: Self-pay | Admitting: Pharmacist

## 2015-05-23 DIAGNOSIS — Z7901 Long term (current) use of anticoagulants: Secondary | ICD-10-CM

## 2015-05-23 DIAGNOSIS — Z952 Presence of prosthetic heart valve: Secondary | ICD-10-CM

## 2015-05-23 DIAGNOSIS — Z954 Presence of other heart-valve replacement: Secondary | ICD-10-CM

## 2015-05-23 DIAGNOSIS — Z5181 Encounter for therapeutic drug level monitoring: Secondary | ICD-10-CM

## 2015-05-23 LAB — POCT INR: INR: 3.3

## 2015-05-28 DIAGNOSIS — Z862 Personal history of diseases of the blood and blood-forming organs and certain disorders involving the immune mechanism: Secondary | ICD-10-CM | POA: Diagnosis not present

## 2015-05-28 DIAGNOSIS — I129 Hypertensive chronic kidney disease with stage 1 through stage 4 chronic kidney disease, or unspecified chronic kidney disease: Secondary | ICD-10-CM | POA: Diagnosis not present

## 2015-05-28 DIAGNOSIS — N184 Chronic kidney disease, stage 4 (severe): Secondary | ICD-10-CM | POA: Diagnosis not present

## 2015-05-28 DIAGNOSIS — Z940 Kidney transplant status: Secondary | ICD-10-CM | POA: Diagnosis not present

## 2015-05-29 ENCOUNTER — Encounter: Payer: Self-pay | Admitting: Family Medicine

## 2015-05-29 DIAGNOSIS — D649 Anemia, unspecified: Secondary | ICD-10-CM

## 2015-05-29 DIAGNOSIS — Z7901 Long term (current) use of anticoagulants: Secondary | ICD-10-CM

## 2015-05-29 NOTE — Telephone Encounter (Signed)
Will draw or request B12 lab records  I am ok with injections if we have documented B12 defficiency I usually start with once a week for a month then once a month  Laroy Apple, MD [facility] 05/29/2015, 5:29 PM

## 2015-05-31 ENCOUNTER — Telehealth: Payer: Self-pay | Admitting: *Deleted

## 2015-05-31 ENCOUNTER — Ambulatory Visit (INDEPENDENT_AMBULATORY_CARE_PROVIDER_SITE_OTHER): Payer: Self-pay | Admitting: Pharmacist

## 2015-05-31 DIAGNOSIS — Z954 Presence of other heart-valve replacement: Secondary | ICD-10-CM

## 2015-05-31 DIAGNOSIS — Z952 Presence of prosthetic heart valve: Secondary | ICD-10-CM

## 2015-05-31 DIAGNOSIS — Z5181 Encounter for therapeutic drug level monitoring: Secondary | ICD-10-CM

## 2015-05-31 DIAGNOSIS — Z7901 Long term (current) use of anticoagulants: Secondary | ICD-10-CM

## 2015-05-31 LAB — POCT INR: INR: 3

## 2015-05-31 NOTE — Progress Notes (Signed)
No charge for labs or visit - INR check through home monitoring system  

## 2015-05-31 NOTE — Telephone Encounter (Signed)
Attempted call, no answer. Left VM that I will ask nursing to call.   Will ask nursing to call and try to clarify what he is wanting.   Laroy Apple, MD [facility] 05/31/2015, 12:19 PM

## 2015-05-31 NOTE — Telephone Encounter (Signed)
addt notes in mychart message sent back to Dr. Wendi Snipes

## 2015-05-31 NOTE — Telephone Encounter (Signed)
Tc back from pt that he has not had a B12 labs done before, would like to be referred to Dr. Whitney Muse, Hematologist in [place] for this.

## 2015-05-31 NOTE — Addendum Note (Signed)
Addended by: Jamelle Haring on: 05/31/2015 02:56 PM   Modules accepted: Orders

## 2015-06-01 NOTE — Telephone Encounter (Signed)
I will refer, s/p kidney replacement with normocytic anemia at last lab seen.   Laroy Apple, MD [facility] 06/01/2015, 2:27 PM

## 2015-06-01 NOTE — Addendum Note (Signed)
Addended by: Timmothy Euler on: 06/01/2015 02:01 PM   Modules accepted: Orders

## 2015-06-01 NOTE — Telephone Encounter (Signed)
Would recommend checking for anemia and B12 defficiency before referring to hematology.   Will ask nursing to contact and arrange labs which I ordered.   Laroy Apple, MD [facility] 06/01/2015, 1:09 PM

## 2015-06-01 NOTE — Addendum Note (Signed)
Addended by: Timmothy Euler on: 06/01/2015 02:44 PM   Modules accepted: Orders

## 2015-06-06 ENCOUNTER — Ambulatory Visit: Payer: 59 | Admitting: Pharmacist

## 2015-06-06 ENCOUNTER — Ambulatory Visit (INDEPENDENT_AMBULATORY_CARE_PROVIDER_SITE_OTHER): Payer: 59 | Admitting: Pharmacist

## 2015-06-06 DIAGNOSIS — Z952 Presence of prosthetic heart valve: Secondary | ICD-10-CM

## 2015-06-06 DIAGNOSIS — Z954 Presence of other heart-valve replacement: Secondary | ICD-10-CM

## 2015-06-06 DIAGNOSIS — Z7901 Long term (current) use of anticoagulants: Secondary | ICD-10-CM

## 2015-06-06 DIAGNOSIS — Z5181 Encounter for therapeutic drug level monitoring: Secondary | ICD-10-CM | POA: Diagnosis not present

## 2015-06-06 LAB — POCT INR: INR: 4.8

## 2015-06-06 NOTE — Progress Notes (Signed)
Patient checks INR at home with Home INR monitoring.  Billing once per month interupertation fee.  Patient diagnosis - heart valve replacement; long term amticoagulation Procedure code if G0250

## 2015-06-08 ENCOUNTER — Ambulatory Visit (INDEPENDENT_AMBULATORY_CARE_PROVIDER_SITE_OTHER): Payer: Self-pay | Admitting: Pharmacist

## 2015-06-08 DIAGNOSIS — Z952 Presence of prosthetic heart valve: Secondary | ICD-10-CM

## 2015-06-08 DIAGNOSIS — Z954 Presence of other heart-valve replacement: Secondary | ICD-10-CM

## 2015-06-08 DIAGNOSIS — Z7901 Long term (current) use of anticoagulants: Secondary | ICD-10-CM

## 2015-06-08 DIAGNOSIS — Z5181 Encounter for therapeutic drug level monitoring: Secondary | ICD-10-CM

## 2015-06-08 LAB — POCT INR: INR: 3.2

## 2015-06-08 NOTE — Progress Notes (Signed)
Patient checks INR at home with Home INR monitoring.  Billing once per month interupertation fee.  Patient diagnosis - prosthetic heart valve Procedure code if [id number]

## 2015-06-11 DIAGNOSIS — N184 Chronic kidney disease, stage 4 (severe): Secondary | ICD-10-CM | POA: Diagnosis not present

## 2015-06-11 DIAGNOSIS — D631 Anemia in chronic kidney disease: Secondary | ICD-10-CM | POA: Diagnosis not present

## 2015-06-12 ENCOUNTER — Encounter (HOSPITAL_COMMUNITY): Payer: Medicare Other | Attending: Hematology & Oncology | Admitting: Hematology & Oncology

## 2015-06-12 ENCOUNTER — Encounter (HOSPITAL_COMMUNITY): Payer: Self-pay | Admitting: Hematology & Oncology

## 2015-06-12 VITALS — BP 135/52 | HR 64 | Temp 97.6°F | Resp 18 | Ht 69.5 in | Wt 197.0 lb

## 2015-06-12 DIAGNOSIS — Z952 Presence of prosthetic heart valve: Secondary | ICD-10-CM | POA: Insufficient documentation

## 2015-06-12 DIAGNOSIS — D631 Anemia in chronic kidney disease: Secondary | ICD-10-CM

## 2015-06-12 DIAGNOSIS — I129 Hypertensive chronic kidney disease with stage 1 through stage 4 chronic kidney disease, or unspecified chronic kidney disease: Secondary | ICD-10-CM | POA: Insufficient documentation

## 2015-06-12 DIAGNOSIS — G4733 Obstructive sleep apnea (adult) (pediatric): Secondary | ICD-10-CM | POA: Insufficient documentation

## 2015-06-12 DIAGNOSIS — D849 Immunodeficiency, unspecified: Secondary | ICD-10-CM

## 2015-06-12 DIAGNOSIS — Z85528 Personal history of other malignant neoplasm of kidney: Secondary | ICD-10-CM | POA: Insufficient documentation

## 2015-06-12 DIAGNOSIS — E785 Hyperlipidemia, unspecified: Secondary | ICD-10-CM | POA: Insufficient documentation

## 2015-06-12 DIAGNOSIS — N183 Chronic kidney disease, stage 3 unspecified: Secondary | ICD-10-CM

## 2015-06-12 DIAGNOSIS — Z79899 Other long term (current) drug therapy: Secondary | ICD-10-CM | POA: Insufficient documentation

## 2015-06-12 DIAGNOSIS — N189 Chronic kidney disease, unspecified: Secondary | ICD-10-CM | POA: Diagnosis not present

## 2015-06-12 DIAGNOSIS — M1990 Unspecified osteoarthritis, unspecified site: Secondary | ICD-10-CM | POA: Insufficient documentation

## 2015-06-12 DIAGNOSIS — Z992 Dependence on renal dialysis: Secondary | ICD-10-CM | POA: Diagnosis not present

## 2015-06-12 DIAGNOSIS — Z9889 Other specified postprocedural states: Secondary | ICD-10-CM | POA: Insufficient documentation

## 2015-06-12 DIAGNOSIS — D649 Anemia, unspecified: Secondary | ICD-10-CM | POA: Diagnosis not present

## 2015-06-12 DIAGNOSIS — Z940 Kidney transplant status: Secondary | ICD-10-CM | POA: Insufficient documentation

## 2015-06-12 DIAGNOSIS — D899 Disorder involving the immune mechanism, unspecified: Secondary | ICD-10-CM

## 2015-06-12 DIAGNOSIS — Z7901 Long term (current) use of anticoagulants: Secondary | ICD-10-CM | POA: Insufficient documentation

## 2015-06-12 LAB — RETICULOCYTES
RBC.: 3.54 MIL/uL — AB (ref 4.22–5.81)
Retic Count, Absolute: 109.7 10*3/uL (ref 19.0–186.0)
Retic Ct Pct: 3.1 % (ref 0.4–3.1)

## 2015-06-12 LAB — CBC WITH DIFFERENTIAL/PLATELET
BASOS ABS: 0 10*3/uL (ref 0.0–0.1)
BASOS PCT: 0 %
Eosinophils Absolute: 0.2 10*3/uL (ref 0.0–0.7)
Eosinophils Relative: 2 %
HEMATOCRIT: 33.6 % — AB (ref 39.0–52.0)
HEMOGLOBIN: 9.9 g/dL — AB (ref 13.0–17.0)
Lymphocytes Relative: 7 %
Lymphs Abs: 0.5 10*3/uL — ABNORMAL LOW (ref 0.7–4.0)
MCH: 27.9 pg (ref 26.0–34.0)
MCHC: 29.5 g/dL — ABNORMAL LOW (ref 30.0–36.0)
MCV: 94.6 fL (ref 78.0–100.0)
MONO ABS: 0.5 10*3/uL (ref 0.1–1.0)
Monocytes Relative: 7 %
NEUTROS ABS: 6.4 10*3/uL (ref 1.7–7.7)
NEUTROS PCT: 84 %
Platelets: 159 10*3/uL (ref 150–400)
RBC: 3.55 MIL/uL — AB (ref 4.22–5.81)
RDW: 14.6 % (ref 11.5–15.5)
WBC: 7.6 10*3/uL (ref 4.0–10.5)

## 2015-06-12 LAB — IRON AND TIBC
Iron: 47 ug/dL (ref 45–182)
SATURATION RATIOS: 17 % — AB (ref 17.9–39.5)
TIBC: 274 ug/dL (ref 250–450)
UIBC: 227 ug/dL

## 2015-06-12 LAB — LACTATE DEHYDROGENASE: LDH: 297 U/L — ABNORMAL HIGH (ref 98–192)

## 2015-06-12 LAB — FERRITIN: Ferritin: 428 ng/mL — ABNORMAL HIGH (ref 24–336)

## 2015-06-12 LAB — SEDIMENTATION RATE: Sed Rate: 4 mm/hr (ref 0–16)

## 2015-06-12 LAB — VITAMIN B12: VITAMIN B 12: 533 pg/mL (ref 180–914)

## 2015-06-12 LAB — FOLATE: Folate: 31.8 ng/mL (ref >5.9)

## 2015-06-12 LAB — C-REACTIVE PROTEIN

## 2015-06-12 NOTE — Patient Instructions (Addendum)
..  [place] at [facility] Discharge Instructions  RECOMMENDATIONS MADE BY THE CONSULTANT AND ANY TEST RESULTS WILL BE SENT TO YOUR REFERRING PHYSICIAN.   We will need to do lab work today and will give you stool cards today We will set up a bone marrow biopsy in the next couple of weeks at [facility] in [place] will call you to schedule this  They do them in the morning and you should not eat before you go, will need a driver because they sedate you This will help answer if it is Kidney related or related to the immunosuppression drugs you are on  We will call you if we need to as your labs come back Bring stool cards back after you collect all 3    Thank you for choosing [place] at [facility] to provide your oncology and hematology care.  To afford each patient quality time with our provider, please arrive at least 15 minutes before your scheduled appointment time.   Beginning January 23rd 2017 lab work for the [facility] will be done in the  Main lab at [facility] on 1st floor. If you have a lab appointment with the [facility] please come in thru the  Main Entrance and check in at the main information desk  You need to re-schedule your appointment should you arrive 10 or more minutes late.  We strive to give you quality time with our providers, and arriving late affects you and other patients whose appointments are after yours.  Also, if you no show three or more times for appointments you may be dismissed from the clinic at the providers discretion.     Again, thank you for choosing [facility].  Our hope is that these requests will decrease the amount of time that you wait before being seen by our physicians.       _____________________________________________________________  Should you have questions after your visit to [facility], please contact our office at [phone] between the hours  of 8:30 a.m. and 4:30 p.m.  Voicemails left after 4:30 p.m. will not be returned until the following business day.  For prescription refill requests, have your pharmacy contact our office.         Resources For Cancer Patients and their Caregivers ? American Cancer Society: Can assist with transportation, wigs, general needs, runs Look Good Feel Better.        ([phone] ? Cancer Care: Provides financial assistance, online support groups, medication/co-pay assistance.  1-800-813-HOPE 250-045-1280) ? [facility] Assists [place] cancer patients and their families through emotional , educational and financial support.  941-866-4593 ? [place] DSS Where to apply for food stamps, Medicaid and utility assistance. 586-609-5289 ? [facility]: Transportation to medical appointments. 209-419-2316 ? Social Security Administration: May apply for disability if have a Stage IV cancer. 431-301-4736 612-776-8712 ? [employer], Disability and Transit Services: Assists with nutrition, care and transit needs. (269)079-9608

## 2015-06-12 NOTE — Progress Notes (Signed)
..  Tom D Kovacevic's reason for visit today are for labs as scheduled per MD orders.  Venipuncture performed with a 23 gauge butterfly needle to L Antecubital.  Jeremy [name] tolerated venipuncture well and without incident; questions were answered and patient was discharged.

## 2015-06-12 NOTE — Progress Notes (Signed)
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 Moderate arterionephrosclerosis with focal features of accelerated hypertension related injury, moderate diabetic nephropathy, recurrent IgA nephropathy   HISTORY OF PRESENTING ILLNESS:  Jeremy [name] 60 y.o. male is here because of anemia.   He reports the history of his kidney issues as follows. They began in his 20's; he notes that he was at work when he noticed that his urine looked like iced tea. He was sent to the doctor right away; says he received antibiotics for some form of germ; never went on dialysis. Later on, he reports that they did a biopsy on his kidney and found he had IgA nephropathy. He says this got better, but that he needed a transplant 9 years later. It took him 2-3 years of waiting on dialysis before he got his first transplant. He notes that he did peritoneal dialysis for a while, and then did hemodialysis for a while. He notes that his kidney transplant was a cadaver transplant that lasted 9 years, then his creatinine got up to 3.5; they began working him up to get him back on to the transplant list before he went back into dialysis. Then they did an MRI and found a growth in his transplanted kidney; he comments that they "took everything out," including the transplant and his natural kidneys. He notes cancer in all of his kidneys. He then had to wait 5 years on dialysis before receiving another transplant to make sure he was cancer  free.  He currently has a transplant and is not on dialysis anymore. He is on bloodthinners due to his aortic heart valve replacement, in 2014. He has a mechanical valve. He received his second kidney transplant in September 2014.  He currently sees Dr. Kendall Flack at [facility] every 6 months, with no cancer recurrence so far. He says that now his problem is anemia; he has no energy to do anything. He notes that when his counts were around 11, he felt great, however, his fatigue is now constant. He says he feels a lot of chest heaviness and that whenever he walks anywhere, it feels like he's going to have heart palpitations.  He traces the start of his anemia to after the transplant; at that time, he could get his hemoglobin up to about 11, then all of a sudden the creatinine started creeping up to 2.3 - 2.5. He notes doing aranesp shots every 2 weeks at the clinic on [address]. He remarks that this is a 150 mg dose every 2 weeks, but that this still can't get him up above 9. He remarks that he thinks his count was 9.3 yesterday. He also takes immunosuppressant medication/anti-rejection meds.  He is up for doing whatever it takes to treat his anemia. He says he misses his quality of life.  He is scheduled for another appointment with his nephrologist on the 14th, to discuss dosage of his medicines. He also used to have some trouble with gout and notes that he could barely walk, but was prescribed prednisone for 7 days, which he  says caused it to "go away" after 3 doses. He finished his prescription and adds that he's also been drinking apple cider vinegar, water, and lemon water.  [name] notes that he has had colonoscopy with GI last year at [facility]. He does not recall having a pillcam. He says he's seen blood in his stool with hemorrhoids, but denies any dark, tarry, or sticky stool.  He would like to have all of his future labs and shots done here at [facility]. For primary care, he sees Dr. Wendi Snipes  at [place]:  Past Medical History  Diagnosis Date  . 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  Marland Kitchen Hx of colonoscopy     2009 by Dr. Laural Golden. Normal except for small submucosal lipoma. No evidence of polyps or colitis.  . Hyperlipidemia   . Hypertension   . Arthritis   . Chronic anticoagulation 06/2012  . Dialysis patient [facility]) 01 01 2008  . Blood transfusion without reported diagnosis   . Cancer ([place])   . OSA (obstructive sleep apnea)     SURGICAL HISTORY: Past Surgical History  Procedure Laterality Date  . Dg av dialysis  shunt access exist*l* or      left arm  . Back surgery    . Coronary angioplasty with stent placement    . Kidney transplant    . Aortic valve replacement  3 /11 /2014    At [facility]    SOCIAL HISTORY: Social History   Social History  . Marital Status: Divorced    Spouse Name: N/A  . Number of Children: N/A  . Years of Education: N/A   Occupational History  . courier     [employer]   Social History Main Topics  . Smoking status: Never Smoker   . Smokeless tobacco: Not on file  . Alcohol Use: No  . Drug Use: No  . Sexual Activity: Not on file   Other Topics Concern  . Not on file   Social History Narrative   Single   1 daughter 78    Divorced 88 y/o daughter; son killed in a car accident when he was 85, a while back 2 grandsons, both aged 63 Don't smoke, don't drink; "cuss a little bit, but other than that ..." Used to work for [employer], but transplant has cost him everything No energy, dialysis  No hobbies right now; no energy to do anything Used to like doing woodworking; furniture, cabinets, keeping busy Likes fixing and flipping cars  FAMILY HISTORY: History reviewed. No pertinent family  history. indicated that his mother is deceased. He indicated that his father is alive. He reported the following about his sister: health unknown. He reported the following about his brother: unknown.   Father still living; 57-80 Not healthy, has a touch of dementia and heart issues  Sister died of kidney issues when she was very young Other than that, no one with kidney problems  Mother died with cancer; maybe pancreatic, unsure of type; aged 3-53  Brother in perfect health, aged 33  ALLERGIES:  is allergic to atorvastatin and lipitor.  MEDICATIONS:  Current Outpatient Prescriptions  Medication Sig Dispense Refill  . acetaminophen (TYLENOL) 325 MG tablet Take 650 mg by mouth every 6 (six) hours as needed.    Marland Kitchen  Ascorbic Acid (VITAMIN C) 100 MG tablet Take 100 mg by mouth daily.    . B Complex Vitamins (VITAMIN-B COMPLEX) TABS Take 1 tablet by mouth.    . calcitRIOL (ROCALTROL) 0.25 MCG capsule Take 0.25 mcg by mouth. Monday - Wednesday-friday    . CALCIUM-MAGNESIUM-ZINC PO Take 1 tablet by mouth daily.    . cholecalciferol (VITAMIN D) 1000 UNITS tablet Take 1,000 Units by mouth daily.    Marland Kitchen Dextrose-Fructose-Sod Citrate [id number] MG CHEW Chew 1 tablet by mouth.    . dicyclomine (BENTYL) 10 MG capsule TAKE 1 CAPSULE BY MOUTH TWICE DAILY. 60 capsule 5  . febuxostat (ULORIC) 40 MG tablet Take 40 mg by mouth daily.    . folic acid-vitamin b complex-vitamin c-selenium-zinc (DIALYVITE) 3 MG TABS Take 1 tablet by mouth daily.      . furosemide (LASIX) 80 MG tablet Take 80 mg by mouth as needed.    . labetalol (NORMODYNE) 300 MG tablet Take 600 mg by mouth 3 (three) times daily.    . magnesium oxide (MAG-OX) 400 (241.3 MG) MG tablet Take 400 mg by mouth 2 (two) times daily.  1  . Multiple Vitamin (MULTI-VITAMINS) TABS Take by mouth.    . mycophenolate (MYFORTIC) 180 MG EC tablet Take 180 mg by mouth. Take 8 tablets daily    . NIFEdipine (PROCARDIA-XL/ADALAT CC) 60 MG 24 hr tablet Take 60  mg by mouth 2 (two) times daily.     . Omega-3 1000 MG CAPS Take 3 g by mouth.    Marland Kitchen omeprazole (PRILOSEC) 20 MG capsule Take 20 mg by mouth daily.    . pravastatin (PRAVACHOL) 40 MG tablet     . predniSONE (DELTASONE) 5 MG tablet Take 5 mg by mouth daily.    Marland Kitchen sulfamethoxazole-trimethoprim (BACTRIM,SEPTRA) 400-80 MG per tablet Take 1 tablet by mouth 3 (three) times a week.    . tacrolimus (PROGRAF) 1 MG capsule Take 2 capsules by mouth 2 (two) times daily.    Marland Kitchen warfarin (COUMADIN) 1 MG tablet TAKE 1-3 TABLETS BY MOUTH ONCE DAILY AS DIRECTED BY ANTICOAGULATION CLINIC. 100 tablet 2  . doxazosin (CARDURA) 1 MG tablet Take 1 mg by mouth daily. Reported on 06/12/2015  3  . loratadine (CLARITIN) 10 MG tablet Take 10 mg by mouth. Reported on 06/12/2015    . nitroGLYCERIN (NITROSTAT) 0.4 MG SL tablet Place 1 tablet (0.4 mg total) under the tongue every 5 (five) minutes as needed for chest pain. (Patient not taking: Reported on 06/12/2015) 25 tablet 2   No current facility-administered medications for this visit.    Review of Systems  Constitutional: Positive for malaise/fatigue.  HENT: Negative.   Eyes: Negative.   Respiratory: Negative.   Cardiovascular: Negative.   Gastrointestinal: Negative.   Genitourinary: Negative.   Musculoskeletal: Negative.   Skin: Negative.   Neurological: Positive for weakness.  Endo/Heme/Allergies: Negative.   Psychiatric/Behavioral: Negative.   All other systems reviewed and are negative.  14 point ROS was done and is otherwise as detailed above or in HPI   PHYSICAL EXAMINATION: ECOG PERFORMANCE STATUS: 1 - Symptomatic but completely ambulatory  Filed Vitals:   06/12/15 1518  BP: 135/52  Pulse: 64  Temp: 97.6 F (36.4 C)  Resp: 18   Filed Weights   06/12/15 1518  Weight: 197 lb (89.359 kg)    Physical Exam  Constitutional: He is oriented to person, place, and time and well-developed, well-nourished, and in no distress.  HENT:  Head: Normocephalic  and atraumatic.  Nose: Nose normal.  Mouth/Throat: Oropharynx is clear and moist. No oropharyngeal exudate.  Eyes: Conjunctivae and EOM are normal. Pupils are equal, round, and reactive to light. Right eye exhibits no discharge. Left eye exhibits no discharge. No scleral icterus.  Neck: Normal range of motion. Neck supple. No tracheal deviation present. No thyromegaly present.  Cardiovascular: Normal rate, regular rhythm, normal heart sounds and intact distal pulses.  Exam reveals no gallop and no friction rub.   No murmur heard. Has a mechanical heart valve.  Pulmonary/Chest: Effort normal and breath sounds normal. He has no wheezes. He has no rales.  Abdominal: Soft. Bowel sounds are normal. He exhibits no distension and no mass. There is no tenderness. There is no rebound and no guarding.  Musculoskeletal: Normal range of motion. He exhibits no edema.  Lymphadenopathy:    He has no cervical adenopathy.  Neurological: He is alert and oriented to person, place, and time. He has normal reflexes. No cranial nerve deficit. Gait normal. Coordination normal.  Skin: Skin is warm and dry. No rash noted.  Psychiatric: Mood, memory, affect and judgment normal.  Nursing note and vitals reviewed.   LABORATORY DATA:  I have reviewed the data as listed Lab Results  Component Value Date   WBC 3.8* 09/16/2014   HGB 10.0* 09/16/2014   HCT 32.4* 09/16/2014   MCV 88.2 09/16/2014   PLT 125* 08/30/2014   CMP     Component Value Date/Time   NA 139 11/28/2011   NA 138 01/06/2008 0720   K 5.0 12/06/2011   CL 93* 01/06/2008 0720   CO2 32 01/06/2008 0720   GLUCOSE 98 01/06/2008 0720   BUN 35* 12/06/2011   BUN 30* 01/06/2008 0720   CREATININE 8.6* 12/06/2011   CREATININE 8.61* 01/06/2008 0720   CALCIUM 9.5 01/06/2008 0720   AST 20 12/06/2011   ALT 25 12/06/2011   ALKPHOS 98 12/06/2011   GFRNONAA 7* 01/06/2008 0720   GFRAA * 01/06/2008 0720    8        The eGFR has been calculated using the  MDRD equation. This calculation has not been validated in all clinical   LABS REVIEWED in CARE EVERYWHERE at [place]:  Persistent Anemia S/P Renal transplant CKD Aranesp growth factor support q 2 weeks Chronic anticoagulation secondary to mechanical St. Jude AVR Chronic Immunosuppression  60 year old with complicated medical history.  His anemia is certainly multifactorial.  He has not had a BMBX and I advised him that for a complete anemia evaluation marrow would be helpful, he is on aranesp however I feel that we can still get an adequate picture of cellular morphology, iron stores, and overall hematopoiesis.  I have recommended stool cards, he has had a C-scope last year we will need to get that report from [facility]. He has never had a pill cam and noteably he is on chronic anticoagulation.  We will obtain the studies below and regroup in two weeks to review.  He would like to see if he can get his aranesp here, I advised him that if his physicians at [facility] concur I certainly am willing to administer it here for his convenience.  Orders Placed This Encounter  Procedures  . CT Biopsy    Standing Status: Future     Number of Occurrences:      Standing Expiration Date: 06/11/2016    Order Specific Question:  Lab orders requested (DO NOT place separate lab orders, these will  be automatically ordered during procedure specimen collection):    Answer:  Surgical Pathology    Order Specific Question:  Reason for Exam (SYMPTOM  OR DIAGNOSIS REQUIRED)    Answer:  anemia, kidney transplant,  BONE MARROW BIOPSY AND ASPIRATE please send flow cytometry and cytogenetics    Order Specific Question:  Preferred imaging location?    Answer:  [facility]  . CBC with Differential    Standing Status: Future     Number of Occurrences:      Standing Expiration Date: 06/11/2016  . Pathologist smear review    Standing Status: Future     Number of Occurrences:      Standing  Expiration Date: 06/11/2016  . IgG, IgA, IgM    Standing Status: Future     Number of Occurrences:      Standing Expiration Date: 06/11/2016  . Immunofixation electrophoresis    Standing Status: Future     Number of Occurrences:      Standing Expiration Date: 06/11/2016  . Protein electrophoresis, serum    Standing Status: Future     Number of Occurrences:      Standing Expiration Date: 06/11/2016  . Kappa/lambda light chains    Standing Status: Future     Number of Occurrences:      Standing Expiration Date: 06/11/2016  . Iron and TIBC    Standing Status: Future     Number of Occurrences:      Standing Expiration Date: 06/11/2016  . Ferritin    Standing Status: Future     Number of Occurrences:      Standing Expiration Date: 06/11/2016  . Vitamin B12    Standing Status: Future     Number of Occurrences:      Standing Expiration Date: 06/11/2016  . Folate    Standing Status: Future     Number of Occurrences:      Standing Expiration Date: 06/11/2016  . Haptoglobin    Standing Status: Future     Number of Occurrences:      Standing Expiration Date: 06/11/2016  . Lactate dehydrogenase    Standing Status: Future     Number of Occurrences:      Standing Expiration Date: 06/11/2016  . Reticulocytes    Standing Status: Future     Number of Occurrences:      Standing Expiration Date: 06/11/2016  . Testosterone, free    Standing Status: Future     Number of Occurrences:      Standing Expiration Date: 06/11/2016  . Testosterone    Standing Status: Future     Number of Occurrences:      Standing Expiration Date: 06/11/2016  . Sedimentation rate    Standing Status: Future     Number of Occurrences:      Standing Expiration Date: 06/11/2016  . C-reactive protein    Standing Status: Future     Number of Occurrences:      Standing Expiration Date: 06/11/2016  . Lactate dehydrogenase    Standing Status: Future     Number of Occurrences:      Standing Expiration Date: 06/11/2016  . Occult blood x 1  card to lab, stool    Standing Status: Future     Number of Occurrences:      Standing Expiration Date: 06/11/2016  . Occult blood x 1 card to lab, stool    Standing Status: Future     Number of Occurrences:      Standing Expiration  Date: 06/11/2016  . Occult blood x 1 card to lab, stool    Standing Status: Future     Number of Occurrences:      Standing Expiration Date: 06/11/2016    All questions were answered. The patient knows to call the clinic with any problems, questions or concerns.  This document serves as a record of services personally performed by Ancil Linsey, MD. It was created on her behalf by Toni Amend, a trained medical scribe. The creation of this record is based on the scribe's personal observations and the provider's statements to them. This document has been checked and approved by the attending provider.  I have reviewed the above documentation for accuracy and completeness and I agree with the above.  This note was electronically signed.  Molli Hazard, MD  06/12/2015 4:00 PM

## 2015-06-13 ENCOUNTER — Ambulatory Visit (INDEPENDENT_AMBULATORY_CARE_PROVIDER_SITE_OTHER): Payer: Self-pay | Admitting: Pharmacist

## 2015-06-13 DIAGNOSIS — Z954 Presence of other heart-valve replacement: Secondary | ICD-10-CM

## 2015-06-13 DIAGNOSIS — Z7901 Long term (current) use of anticoagulants: Secondary | ICD-10-CM

## 2015-06-13 DIAGNOSIS — Z5181 Encounter for therapeutic drug level monitoring: Secondary | ICD-10-CM

## 2015-06-13 DIAGNOSIS — Z952 Presence of prosthetic heart valve: Secondary | ICD-10-CM

## 2015-06-13 LAB — PROTEIN ELECTROPHORESIS, SERUM
A/G Ratio: 1.9 — ABNORMAL HIGH (ref 0.7–1.7)
ALPHA-1-GLOBULIN: 0.3 g/dL (ref 0.0–0.4)
ALPHA-2-GLOBULIN: 0.5 g/dL (ref 0.4–1.0)
Albumin ELP: 3.9 g/dL (ref 2.9–4.4)
BETA GLOBULIN: 0.8 g/dL (ref 0.7–1.3)
GAMMA GLOBULIN: 0.5 g/dL (ref 0.4–1.8)
Globulin, Total: 2 g/dL — ABNORMAL LOW (ref 2.2–3.9)
Total Protein ELP: 5.9 g/dL — ABNORMAL LOW (ref 6.0–8.5)

## 2015-06-13 LAB — IGG, IGA, IGM
IGA: 158 mg/dL (ref 90–386)
IGG (IMMUNOGLOBIN G), SERUM: 560 mg/dL — AB (ref 700–1600)
IgM, Serum: 37 mg/dL (ref 20–172)

## 2015-06-13 LAB — KAPPA/LAMBDA LIGHT CHAINS
Kappa free light chain: 51.23 mg/L — ABNORMAL HIGH (ref 3.30–19.40)
Kappa, lambda light chain ratio: 1.76 — ABNORMAL HIGH (ref 0.26–1.65)
Lambda free light chains: 29.1 mg/L — ABNORMAL HIGH (ref 5.71–26.30)

## 2015-06-13 LAB — TESTOSTERONE: Testosterone: 267 ng/dL — ABNORMAL LOW (ref 348–1197)

## 2015-06-13 LAB — PATHOLOGIST SMEAR REVIEW

## 2015-06-13 LAB — HAPTOGLOBIN: Haptoglobin: 13 mg/dL — ABNORMAL LOW (ref 34–200)

## 2015-06-13 LAB — POCT INR: INR: 2.9

## 2015-06-13 NOTE — Progress Notes (Signed)
No charge for labs or visit - INR check through home monitoring system  

## 2015-06-14 LAB — IMMUNOFIXATION ELECTROPHORESIS
IGA: 159 mg/dL (ref 90–386)
IGG (IMMUNOGLOBIN G), SERUM: 592 mg/dL — AB (ref 700–1600)
IGM, SERUM: 37 mg/dL (ref 20–172)
TOTAL PROTEIN ELP: 5.8 g/dL — AB (ref 6.0–8.5)

## 2015-06-14 LAB — TESTOSTERONE, FREE: Testosterone, Free: 2.7 pg/mL — ABNORMAL LOW (ref 7.2–24.0)

## 2015-06-15 ENCOUNTER — Other Ambulatory Visit (HOSPITAL_COMMUNITY): Payer: Self-pay | Admitting: Emergency Medicine

## 2015-06-15 DIAGNOSIS — N189 Chronic kidney disease, unspecified: Secondary | ICD-10-CM | POA: Diagnosis not present

## 2015-06-15 DIAGNOSIS — D649 Anemia, unspecified: Secondary | ICD-10-CM

## 2015-06-15 DIAGNOSIS — E785 Hyperlipidemia, unspecified: Secondary | ICD-10-CM | POA: Diagnosis not present

## 2015-06-15 DIAGNOSIS — Z85528 Personal history of other malignant neoplasm of kidney: Secondary | ICD-10-CM | POA: Diagnosis not present

## 2015-06-15 DIAGNOSIS — I129 Hypertensive chronic kidney disease with stage 1 through stage 4 chronic kidney disease, or unspecified chronic kidney disease: Secondary | ICD-10-CM | POA: Diagnosis not present

## 2015-06-15 DIAGNOSIS — Z940 Kidney transplant status: Secondary | ICD-10-CM | POA: Diagnosis not present

## 2015-06-15 LAB — OCCULT BLOOD X 1 CARD TO LAB, STOOL
FECAL OCCULT BLD: POSITIVE — AB
Fecal Occult Bld: POSITIVE — AB
Fecal Occult Bld: NEGATIVE

## 2015-06-19 DIAGNOSIS — N028 Recurrent and persistent hematuria with other morphologic changes: Secondary | ICD-10-CM | POA: Diagnosis not present

## 2015-06-19 DIAGNOSIS — M109 Gout, unspecified: Secondary | ICD-10-CM | POA: Diagnosis not present

## 2015-06-19 DIAGNOSIS — I2510 Atherosclerotic heart disease of native coronary artery without angina pectoris: Secondary | ICD-10-CM | POA: Diagnosis not present

## 2015-06-19 DIAGNOSIS — E559 Vitamin D deficiency, unspecified: Secondary | ICD-10-CM | POA: Diagnosis not present

## 2015-06-19 DIAGNOSIS — I129 Hypertensive chronic kidney disease with stage 1 through stage 4 chronic kidney disease, or unspecified chronic kidney disease: Secondary | ICD-10-CM | POA: Diagnosis not present

## 2015-06-19 DIAGNOSIS — D631 Anemia in chronic kidney disease: Secondary | ICD-10-CM | POA: Diagnosis not present

## 2015-06-19 DIAGNOSIS — Z862 Personal history of diseases of the blood and blood-forming organs and certain disorders involving the immune mechanism: Secondary | ICD-10-CM | POA: Diagnosis not present

## 2015-06-19 DIAGNOSIS — Z79899 Other long term (current) drug therapy: Secondary | ICD-10-CM | POA: Diagnosis not present

## 2015-06-19 DIAGNOSIS — E785 Hyperlipidemia, unspecified: Secondary | ICD-10-CM | POA: Diagnosis not present

## 2015-06-19 DIAGNOSIS — K439 Ventral hernia without obstruction or gangrene: Secondary | ICD-10-CM | POA: Diagnosis not present

## 2015-06-19 DIAGNOSIS — N186 End stage renal disease: Secondary | ICD-10-CM | POA: Diagnosis not present

## 2015-06-19 DIAGNOSIS — I120 Hypertensive chronic kidney disease with stage 5 chronic kidney disease or end stage renal disease: Secondary | ICD-10-CM | POA: Diagnosis not present

## 2015-06-19 DIAGNOSIS — N184 Chronic kidney disease, stage 4 (severe): Secondary | ICD-10-CM | POA: Diagnosis not present

## 2015-06-19 DIAGNOSIS — D899 Disorder involving the immune mechanism, unspecified: Secondary | ICD-10-CM | POA: Diagnosis not present

## 2015-06-19 DIAGNOSIS — Z85528 Personal history of other malignant neoplasm of kidney: Secondary | ICD-10-CM | POA: Diagnosis not present

## 2015-06-19 DIAGNOSIS — Z940 Kidney transplant status: Secondary | ICD-10-CM | POA: Diagnosis not present

## 2015-06-19 DIAGNOSIS — Z4822 Encounter for aftercare following kidney transplant: Secondary | ICD-10-CM | POA: Diagnosis not present

## 2015-06-19 DIAGNOSIS — Z9861 Coronary angioplasty status: Secondary | ICD-10-CM | POA: Diagnosis not present

## 2015-06-19 DIAGNOSIS — Z7901 Long term (current) use of anticoagulants: Secondary | ICD-10-CM | POA: Diagnosis not present

## 2015-06-20 ENCOUNTER — Ambulatory Visit (INDEPENDENT_AMBULATORY_CARE_PROVIDER_SITE_OTHER): Payer: Self-pay | Admitting: Pharmacist

## 2015-06-20 DIAGNOSIS — Z7901 Long term (current) use of anticoagulants: Secondary | ICD-10-CM

## 2015-06-20 DIAGNOSIS — Z954 Presence of other heart-valve replacement: Secondary | ICD-10-CM

## 2015-06-20 DIAGNOSIS — Z952 Presence of prosthetic heart valve: Secondary | ICD-10-CM

## 2015-06-20 DIAGNOSIS — Z5181 Encounter for therapeutic drug level monitoring: Secondary | ICD-10-CM

## 2015-06-20 LAB — POCT INR: INR: 2.3

## 2015-06-20 NOTE — Progress Notes (Signed)
No charge for labs or visit - INR check through home monitoring system  

## 2015-06-22 ENCOUNTER — Other Ambulatory Visit: Payer: Self-pay | Admitting: General Surgery

## 2015-06-25 ENCOUNTER — Ambulatory Visit (HOSPITAL_COMMUNITY)
Admission: RE | Admit: 2015-06-25 | Discharge: 2015-06-25 | Disposition: A | Discharge status: Home / Self Care | Payer: Medicare Other | Source: Ambulatory Visit | Attending: Hematology & Oncology | Admitting: Hematology & Oncology

## 2015-06-25 ENCOUNTER — Encounter (HOSPITAL_COMMUNITY): Payer: Self-pay

## 2015-06-25 DIAGNOSIS — D696 Thrombocytopenia, unspecified: Secondary | ICD-10-CM | POA: Insufficient documentation

## 2015-06-25 DIAGNOSIS — D72810 Lymphocytopenia: Secondary | ICD-10-CM | POA: Diagnosis not present

## 2015-06-25 DIAGNOSIS — N189 Chronic kidney disease, unspecified: Secondary | ICD-10-CM | POA: Diagnosis not present

## 2015-06-25 DIAGNOSIS — Z992 Dependence on renal dialysis: Secondary | ICD-10-CM | POA: Insufficient documentation

## 2015-06-25 DIAGNOSIS — G4733 Obstructive sleep apnea (adult) (pediatric): Secondary | ICD-10-CM | POA: Insufficient documentation

## 2015-06-25 DIAGNOSIS — D7589 Other specified diseases of blood and blood-forming organs: Secondary | ICD-10-CM | POA: Diagnosis not present

## 2015-06-25 DIAGNOSIS — Z955 Presence of coronary angioplasty implant and graft: Secondary | ICD-10-CM | POA: Diagnosis not present

## 2015-06-25 DIAGNOSIS — Z85528 Personal history of other malignant neoplasm of kidney: Secondary | ICD-10-CM | POA: Diagnosis not present

## 2015-06-25 DIAGNOSIS — Z7901 Long term (current) use of anticoagulants: Secondary | ICD-10-CM | POA: Diagnosis not present

## 2015-06-25 DIAGNOSIS — I129 Hypertensive chronic kidney disease with stage 1 through stage 4 chronic kidney disease, or unspecified chronic kidney disease: Secondary | ICD-10-CM | POA: Insufficient documentation

## 2015-06-25 DIAGNOSIS — E785 Hyperlipidemia, unspecified: Secondary | ICD-10-CM | POA: Insufficient documentation

## 2015-06-25 DIAGNOSIS — D649 Anemia, unspecified: Secondary | ICD-10-CM | POA: Diagnosis present

## 2015-06-25 DIAGNOSIS — Z79899 Other long term (current) drug therapy: Secondary | ICD-10-CM | POA: Diagnosis not present

## 2015-06-25 DIAGNOSIS — D509 Iron deficiency anemia, unspecified: Secondary | ICD-10-CM | POA: Insufficient documentation

## 2015-06-25 DIAGNOSIS — Z952 Presence of prosthetic heart valve: Secondary | ICD-10-CM | POA: Insufficient documentation

## 2015-06-25 LAB — CBC
HCT: 32.2 % — ABNORMAL LOW (ref 39.0–52.0)
Hemoglobin: 9.5 g/dL — ABNORMAL LOW (ref 13.0–17.0)
MCH: 28.2 pg (ref 26.0–34.0)
MCHC: 29.5 g/dL — ABNORMAL LOW (ref 30.0–36.0)
MCV: 95.5 fL (ref 78.0–100.0)
PLATELETS: 121 10*3/uL — AB (ref 150–400)
RBC: 3.37 MIL/uL — ABNORMAL LOW (ref 4.22–5.81)
RDW: 14.7 % (ref 11.5–15.5)
WBC: 5.4 10*3/uL (ref 4.0–10.5)

## 2015-06-25 LAB — PROTIME-INR
INR: 2.38 — AB (ref 0.00–1.49)
Prothrombin Time: 25.7 seconds — ABNORMAL HIGH (ref 11.6–15.2)

## 2015-06-25 LAB — BONE MARROW EXAM

## 2015-06-25 LAB — APTT: APTT: 40 s — AB (ref 24–37)

## 2015-06-25 MED ORDER — MIDAZOLAM HCL 2 MG/2ML IJ SOLN
INTRAMUSCULAR | Status: AC
  Filled 2015-06-25: qty 2

## 2015-06-25 MED ORDER — FENTANYL CITRATE (PF) 100 MCG/2ML IJ SOLN
INTRAMUSCULAR | Status: AC | PRN
  Administered 2015-06-25: 25 ug via INTRAVENOUS
  Administered 2015-06-25: 50 ug via INTRAVENOUS
  Administered 2015-06-25: 25 ug via INTRAVENOUS

## 2015-06-25 MED ORDER — MIDAZOLAM HCL 2 MG/2ML IJ SOLN
INTRAMUSCULAR | Status: AC
  Filled 2015-06-25: qty 6

## 2015-06-25 MED ORDER — MIDAZOLAM HCL 2 MG/2ML IJ SOLN
INTRAMUSCULAR | Status: AC | PRN
  Administered 2015-06-25: 1 mg via INTRAVENOUS
  Administered 2015-06-25: 0.5 mg via INTRAVENOUS
  Administered 2015-06-25: 1 mg via INTRAVENOUS

## 2015-06-25 MED ORDER — SODIUM CHLORIDE 0.9 % IV SOLN
INTRAVENOUS | Status: DC
  Administered 2015-06-25: 08:00:00 via INTRAVENOUS

## 2015-06-25 MED ORDER — FENTANYL CITRATE (PF) 100 MCG/2ML IJ SOLN
INTRAMUSCULAR | Status: AC
  Filled 2015-06-25: qty 4

## 2015-06-25 NOTE — Discharge Instructions (Signed)
Rest remainder of day. No driving or alcoholic drinks. Leave dressing in place for 24 hours then may remove and bathe as usual. Tylenol if needed for pain. Call [facility] at [phone] for pain not relieved by medication, bleeding, or any questions or problems.    Bone Marrow Aspiration and Bone Marrow Biopsy, Care After Refer to this sheet in the next few weeks. These instructions provide you with information about caring for yourself after your procedure. Your health care provider may also give you more specific instructions. Your treatment has been planned according to current medical practices, but problems sometimes occur. Call your health care provider if you have any problems or questions after your procedure. WHAT TO EXPECT AFTER THE PROCEDURE After your procedure, it is common to have:  Soreness or tenderness around the puncture site.  Bruising. HOME CARE INSTRUCTIONS  Take medicines only as directed by your health care provider.  Follow your health care provider's instructions about:  Puncture site care.  Bandage (dressing) changes and removal.  Bathe and shower as directed by your health care provider.  Check your puncture site every day for signs of infection. Watch for:  Redness, swelling, or pain.  Fluid, blood, or pus.  Return to your normal activities as directed by your health care provider.  Keep all follow-up visits as directed by your health care provider. This is important. SEEK MEDICAL CARE IF:  You have a fever.  You have uncontrollable bleeding.  You have redness, swelling, or pain at the site of your puncture.  You have fluid, blood, or pus coming from your puncture site.   This information is not intended to replace advice given to you by your health care provider. Make sure you discuss any questions you have with your health care provider.   Document Released: 10/11/2004 Document Revised: 08/08/2014 Document Reviewed:  03/15/2014 Elsevier Interactive Patient Education 2016 Elsevier [employer]. Moderate Conscious Sedation, Adult, Care After Refer to this sheet in the next few weeks. These instructions provide you with information on caring for yourself after your procedure. Your health care provider may also give you more specific instructions. Your treatment has been planned according to current medical practices, but problems sometimes occur. Call your health care provider if you have any problems or questions after your procedure. WHAT TO EXPECT AFTER THE PROCEDURE  After your procedure:  You may feel sleepy, clumsy, and have poor balance for several hours.  Vomiting may occur if you eat too soon after the procedure. HOME CARE INSTRUCTIONS  Do not participate in any activities where you could become injured for at least 24 hours. Do not:  Drive.  Swim.  Ride a bicycle.  Operate heavy machinery.  Cook.  Use power tools.  Climb ladders.  Work from a high place.  Do not make important decisions or sign legal documents until you are improved.  If you vomit, drink water, juice, or soup when you can drink without vomiting. Make sure you have little or no nausea before eating solid foods.  Only take over-the-counter or prescription medicines for pain, discomfort, or fever as directed by your health care provider.  Make sure you and your family fully understand everything about the medicines given to you, including what side effects may occur.  You should not drink alcohol, take sleeping pills, or take medicines that cause drowsiness for at least 24 hours.  If you smoke, do not smoke without supervision.  If you are feeling better, you may resume  normal activities 24 hours after you were sedated.  Keep all appointments with your health care provider. SEEK MEDICAL CARE IF:  Your skin is pale or bluish in color.  You continue to feel nauseous or vomit.  Your pain is getting worse and is not  helped by medicine.  You have bleeding or swelling.  You are still sleepy or feeling clumsy after 24 hours. SEEK IMMEDIATE MEDICAL CARE IF:  You develop a rash.  You have difficulty breathing.  You develop any type of allergic problem.  You have a fever. MAKE SURE YOU:  Understand these instructions.  Will watch your condition.  Will get help right away if you are not doing well or get worse.   This information is not intended to replace advice given to you by your health care provider. Make sure you discuss any questions you have with your health care provider.   Document Released: 01/12/2013 Document Revised: 04/14/2014 Document Reviewed: 01/12/2013 Elsevier Interactive Patient Education [employer].

## 2015-06-25 NOTE — H&P (Signed)
Chief Complaint: Patient was seen in consultation today for CT-guided bone marrow biopsy  Referring Physician(s): Penland,Shannon K   Supervising Physician: Corrie Mckusick  History of Present Illness: Jeremy [name] is a 60 y.o. 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 He presents today for CT guided bone marrow biopsy for further evaluation  Past Medical History  Diagnosis Date  . 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  Marland Kitchen Hx of colonoscopy     2009 by Dr. Laural Golden. Normal except for small submucosal lipoma. No evidence of polyps or colitis.  . Hyperlipidemia   . Hypertension   . Arthritis   . Chronic anticoagulation 06/2012  . Dialysis patient [facility]) 01 01 2008  . Blood transfusion without reported diagnosis   . Cancer ([place])   . OSA (obstructive sleep apnea)   . Anemia     Past Surgical History  Procedure Laterality Date  . Dg av dialysis  shunt access exist*l* or      left arm  . Back surgery    . Coronary angioplasty with stent placement    . Kidney transplant    . Aortic valve replacement  3 /11 /2014    At [facility]    Allergies: Atorvastatin and Lipitor  Medications: Prior to Admission medications   Medication Sig Start Date End Date Taking? Authorizing Provider  acetaminophen (TYLENOL) 325 MG tablet Take 650 mg by mouth every 6 (six) hours as needed.    Historical Provider, MD  Ascorbic Acid (VITAMIN C) 100 MG tablet Take 100 mg by mouth daily.    Historical Provider, MD  B Complex Vitamins (VITAMIN-B COMPLEX) TABS  Take 1 tablet by mouth.    Historical Provider, MD  calcitRIOL (ROCALTROL) 0.25 MCG capsule Take 0.25 mcg by mouth. Monday - Wednesday-friday 05/30/15   Historical Provider, MD  CALCIUM-MAGNESIUM-ZINC PO Take 1 tablet by mouth daily.    Historical Provider, MD  cholecalciferol (VITAMIN D) 1000 UNITS tablet Take 1,000 Units by mouth daily.    Historical Provider, MD  Dextrose-Fructose-Sod Citrate [phone] MG CHEW Chew 1 tablet by mouth.    Historical Provider, MD  dicyclomine (BENTYL) 10 MG capsule TAKE 1 CAPSULE BY MOUTH TWICE DAILY. 11/09/13   Rogene Houston, MD  doxazosin (CARDURA) 1 MG tablet Take 1 mg by mouth daily. Reported on 06/12/2015 01/11/15   Historical Provider, MD  febuxostat (ULORIC) 40 MG tablet Take 40 mg by mouth daily.    Historical Provider, MD  folic acid-vitamin b complex-vitamin c-selenium-zinc (DIALYVITE) 3 MG TABS Take 1 tablet by mouth daily.      Historical Provider, MD  furosemide (LASIX) 80 MG tablet Take 80 mg by mouth as needed.    Historical Provider, MD  labetalol (NORMODYNE) 300 MG tablet Take 600 mg by mouth 3 (three) times daily. 07/26/12   Vernie Shanks, MD  loratadine (CLARITIN) 10 MG tablet Take 10 mg by mouth. Reported on 06/12/2015    Historical Provider, MD  magnesium oxide (MAG-OX) 400 (241.3 MG) MG tablet Take 400 mg by mouth 2 (two) times daily. 11/01/14   Historical Provider,  MD  Multiple Vitamin (MULTI-VITAMINS) TABS Take by mouth.    Historical Provider, MD  mycophenolate (MYFORTIC) 180 MG EC tablet Take 180 mg by mouth. Take 8 tablets daily    Historical Provider, MD  NIFEdipine (PROCARDIA-XL/ADALAT CC) 60 MG 24 hr tablet Take 60 mg by mouth 2 (two) times daily.     Historical Provider, MD  nitroGLYCERIN (NITROSTAT) 0.4 MG SL tablet Place 1 tablet (0.4 mg total) under the tongue every 5 (five) minutes as needed for chest pain. Patient not taking: Reported on 06/12/2015 12/02/13   Lysbeth Penner, FNP  Omega-3 1000 MG CAPS Take 3 g by mouth.    Historical  Provider, MD  omeprazole (PRILOSEC) 20 MG capsule Take 20 mg by mouth daily.    Historical Provider, MD  pravastatin (PRAVACHOL) 40 MG tablet  12/27/12   Historical Provider, MD  predniSONE (DELTASONE) 5 MG tablet Take 5 mg by mouth daily.    Historical Provider, MD  sulfamethoxazole-trimethoprim (BACTRIM,SEPTRA) 400-80 MG per tablet Take 1 tablet by mouth 3 (three) times a week.    Historical Provider, MD  tacrolimus (PROGRAF) 1 MG capsule Take 2 capsules by mouth 2 (two) times daily. 09/11/14   Historical Provider, MD  warfarin (COUMADIN) 1 MG tablet TAKE 1-3 TABLETS BY MOUTH ONCE DAILY AS DIRECTED BY ANTICOAGULATION CLINIC. 05/16/15   Timmothy Euler, MD     No family history on file.  Social History   Social History  . Marital Status: Divorced    Spouse Name: N/A  . Number of Children: N/A  . Years of Education: N/A   Occupational History  . courier     [employer]   Social History Main Topics  . Smoking status: Never Smoker   . Smokeless tobacco: Not on file  . Alcohol Use: No  . Drug Use: No  . Sexual Activity: Not on file   Other Topics Concern  . Not on file   Social History Narrative   Single   1 daughter 82       Review of Systems  Constitutional: Negative for fever and chills.  Respiratory: Negative for cough.        Some dyspnea with exertion  Cardiovascular: Positive for leg swelling. Negative for chest pain.  Gastrointestinal: Negative for nausea, vomiting, abdominal pain and blood in stool.  Genitourinary: Negative for dysuria and hematuria.  Musculoskeletal: Negative for back pain.  Neurological: Negative for headaches.    Vital Signs: Blood pressure 139/60, temperature 98, heart rate 65, respirations 18, oxygen saturation is 97% room air   Physical Exam  Constitutional: He is oriented to person, place, and time. He appears well-developed and well-nourished.  Cardiovascular: Normal rate and regular rhythm.   Left arm AVF with good thrill/bruit   Pulmonary/Chest: Effort normal and breath sounds normal.  Abdominal: Soft. There is no tenderness.  Musculoskeletal: Normal range of motion. He exhibits edema.  Neurological: He is alert and oriented to person, place, and time.    Mallampati Score:     Imaging: No results found.  Labs:  CBC:  Recent Labs  08/30/14 0957 08/30/14 1013 09/16/14 1041 06/12/15 1600 06/25/15 0720  WBC 2.6* 2.5* 3.8* 7.6 5.4  HGB 10.0*  --  10.0* 9.9* 9.5*  HCT 32.6* 32.2* 32.4* 33.6* 32.2*  PLT  --  125*  --  159 121*    COAGS:  Recent Labs  06/08/15 06/13/15 06/20/15 06/25/15 0720  INR 3.2 2.9 2.3 2.38*  APTT  --   --   --  40*    BMP: No results for input(s): NA, K, CL, CO2, GLUCOSE, BUN, CALCIUM, CREATININE, GFRNONAA, GFRAA in the last 8760 hours.  Invalid input(s): CMP  LIVER FUNCTION TESTS: No results for input(s): BILITOT, AST, ALT, ALKPHOS, PROT, ALBUMIN in the last 8760 hours.  TUMOR MARKERS: No results for input(s): AFPTM, CEA, CA199, CHROMGRNA in the last 8760 hours.  Assessment and Plan:  59 y.o. 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 He presents today for CT guided bone marrow biopsy for further evaluation.Risks and benefits discussed with the patient including, but not limited to bleeding, infection, damage to adjacent structures or low yield requiring additional tests.All of the patient's questions were answered, patient is agreeable to proceed. Consent signed and in chart.      Thank you for this interesting consult.  I greatly enjoyed meeting Jamario D Ken and look forward to participating in their care.  A copy of this report was sent to the requesting provider on this date.  Electronically Signed: D. Kevin  Allred 06/25/2015, 8:33 AM   I spent a total of 15 minutes in face to face in clinical consultation, greater than 50% of which was counseling/coordinating care for CT guided bone marrow biopsy         

## 2015-06-25 NOTE — Discharge Instructions (Signed)
Bone Marrow Aspiration and Bone Marrow Biopsy °Bone marrow aspiration and bone marrow biopsy are procedures that are done to diagnose blood disorders. You may also have one of these procedures to help diagnose infections or some types of cancer. °Bone marrow is the soft tissue that is inside your bones. Blood cells are produced in bone marrow. For bone marrow aspiration, a sample of tissue in liquid form is removed from inside your bone. For a bone marrow biopsy, a small core of bone marrow tissue is removed. Then these samples are examined under a microscope or tested in a lab. °You may need these procedures if you have an abnormal complete blood count (CBC). The aspiration or biopsy sample is usually taken from the top of your hip bone. Sometimes, an aspiration sample is taken from your chest bone (sternum). °LET YOUR HEALTH CARE PROVIDER KNOW ABOUT: °· Any allergies you have. °· All medicines you are taking, including vitamins, herbs, eye drops, creams, and over-the-counter medicines. °· Previous problems you or members of your family have had with the use of anesthetics. °· Any blood disorders you have. °· Previous surgeries you have had. °· Any medical conditions you may have. °· Whether you are pregnant or you think that you may be pregnant. °RISKS AND COMPLICATIONS °Generally, this is a safe procedure. However, problems may occur, including: °· Infection. °· Bleeding. °BEFORE THE PROCEDURE °· Ask your health care provider about: °¨ Changing or stopping your regular medicines. This is especially important if you are taking diabetes medicines or blood thinners. °¨ Taking medicines such as aspirin and ibuprofen. These medicines can thin your blood. Do not take these medicines before your procedure if your health care provider instructs you not to. °· Plan to have someone take you home after the procedure. °· If you go home right after the procedure, plan to have someone with you for 24 hours. °PROCEDURE  °· An  IV tube may be inserted into one of your veins. °· The injection site will be cleaned with a germ-killing solution (antiseptic). °· You will be given one or more of the following: °¨ A medicine that helps you relax (sedative). °¨ A medicine that numbs the area (local anesthetic). °· The bone marrow sample will be removed as follows: °¨ For an aspiration, a hollow needle will be inserted through your skin and into your bone. Bone marrow fluid will be drawn up into a syringe. °¨ For a biopsy, your health care provider will use a hollow needle to remove a core of tissue from your bone marrow. °· The needle will be removed. °· A bandage (dressing) will be placed over the insertion site and taped in place. °The procedure may vary among health care providers and hospitals. °AFTER THE PROCEDURE °· Your blood pressure, heart rate, breathing rate, and blood oxygen level will be monitored often until the medicines you were given have worn off. °· Return to your normal activities as directed by your health care provider. °  °This information is not intended to replace advice given to you by your health care provider. Make sure you discuss any questions you have with your health care provider. °  °Document Released: 03/27/2004 Document Revised: 08/08/2014 Document Reviewed: 03/15/2014 °Elsevier Interactive Patient Education ©2016 Elsevier Inc. °Bone Marrow Aspiration and Bone Marrow Biopsy, Care After °Refer to this sheet in the next few weeks. These instructions provide you with information about caring for yourself after your procedure. Your health care provider may also give you   more specific instructions. Your treatment has been planned according to current medical practices, but problems sometimes occur. Call your health care provider if you have any problems or questions after your procedure. WHAT TO EXPECT AFTER THE PROCEDURE After your procedure, it is common to have:  Soreness or tenderness around the puncture  site.  Bruising. HOME CARE INSTRUCTIONS  Take medicines only as directed by your health care provider.  Follow your health care provider's instructions about:  Puncture site care.  Bandage (dressing) changes and removal.  Bathe and shower as directed by your health care provider.  Check your puncture site every day for signs of infection. Watch for:  Redness, swelling, or pain.  Fluid, blood, or pus.  Return to your normal activities as directed by your health care provider.  Keep all follow-up visits as directed by your health care provider. This is important. SEEK MEDICAL CARE IF:  You have a fever.  You have uncontrollable bleeding.  You have redness, swelling, or pain at the site of your puncture.  You have fluid, blood, or pus coming from your puncture site.   This information is not intended to replace advice given to you by your health care provider. Make sure you discuss any questions you have with your health care provider.   Document Released: 10/11/2004 Document Revised: 08/08/2014 Document Reviewed: 03/15/2014 Elsevier Interactive Patient Education 2016 Elsevier Inc. Moderate Conscious Sedation, Adult Sedation is the use of medicines to promote relaxation and relieve discomfort and anxiety. Moderate conscious sedation is a type of sedation. Under moderate conscious sedation you are less alert than normal but are still able to respond to instructions or stimulation. Moderate conscious sedation is used during short medical and dental procedures. It is milder than deep sedation or general anesthesia and allows you to return to your regular activities sooner. LET [facility] CARE PROVIDER KNOW ABOUT:   Any allergies you have.  All medicines you are taking, including vitamins, herbs, eye drops, creams, and over-the-counter medicines.  Use of steroids (by mouth or creams).  Previous problems you or members of your family have had with the use of  anesthetics.  Any blood disorders you have.  Previous surgeries you have had.  Medical conditions you have.  Possibility of pregnancy, if this applies.  Use of cigarettes, alcohol, or illegal drugs. RISKS AND COMPLICATIONS Generally, this is a safe procedure. However, as with any procedure, problems can occur. Possible problems include:  Oversedation.  Trouble breathing on your own. You may need to have a breathing tube until you are awake and breathing on your own.  Allergic reaction to any of the medicines used for the procedure. BEFORE THE PROCEDURE  You may have blood tests done. These tests can help show how well your kidneys and liver are working. They can also show how well your blood clots.  A physical exam will be done.  Only take medicines as directed by your health care provider. You may need to stop taking medicines (such as blood thinners, aspirin, or nonsteroidal anti-inflammatory drugs) before the procedure.   Do not eat or drink at least 6 hours before the procedure or as directed by your health care provider.  Arrange for a responsible adult, family member, or friend to take you home after the procedure. He or she should stay with you for at least 24 hours after the procedure, until the medicine has worn off. PROCEDURE   An intravenous (IV) catheter will be inserted into one of your  veins. Medicine will be able to flow directly into your body through this catheter. You may be given medicine through this tube to help prevent pain and help you relax.  The medical or dental procedure will be done. AFTER THE PROCEDURE  You will stay in a recovery area until the medicine has worn off. Your blood pressure and pulse will be checked.   Depending on the procedure you had, you may be allowed to go home when you can tolerate liquids and your pain is under control.   This information is not intended to replace advice given to you by your health care provider. Make  sure you discuss any questions you have with your health care provider.   Document Released: 12/17/2000 Document Revised: 04/14/2014 Document Reviewed: 11/29/2012 Elsevier Interactive Patient Education [employer].

## 2015-06-25 NOTE — Procedures (Signed)
Interventional Radiology Procedure Note  Procedure: CT guided aspirate and core biopsy of right posterior iliac bone Complications: None Recommendations: - Bedrest supine x 1 hrs - OTC's PRN  Pain - Follow biopsy results  Signed,  Jaryn Hocutt S. Lucretia Pendley, DO    

## 2015-06-27 ENCOUNTER — Ambulatory Visit (INDEPENDENT_AMBULATORY_CARE_PROVIDER_SITE_OTHER): Payer: Self-pay | Admitting: Pharmacist

## 2015-06-27 DIAGNOSIS — Z5181 Encounter for therapeutic drug level monitoring: Secondary | ICD-10-CM

## 2015-06-27 DIAGNOSIS — Z7901 Long term (current) use of anticoagulants: Secondary | ICD-10-CM

## 2015-06-27 DIAGNOSIS — Z952 Presence of prosthetic heart valve: Secondary | ICD-10-CM

## 2015-06-27 DIAGNOSIS — Z954 Presence of other heart-valve replacement: Secondary | ICD-10-CM

## 2015-06-27 LAB — POCT INR: INR: 1.9

## 2015-06-28 ENCOUNTER — Encounter (HOSPITAL_COMMUNITY): Payer: Self-pay | Admitting: Hematology & Oncology

## 2015-06-29 ENCOUNTER — Ambulatory Visit (HOSPITAL_COMMUNITY): Payer: Medicare Other | Admitting: Hematology & Oncology

## 2015-06-29 DIAGNOSIS — K649 Unspecified hemorrhoids: Secondary | ICD-10-CM | POA: Diagnosis not present

## 2015-06-29 DIAGNOSIS — D631 Anemia in chronic kidney disease: Secondary | ICD-10-CM | POA: Diagnosis not present

## 2015-06-29 DIAGNOSIS — N184 Chronic kidney disease, stage 4 (severe): Secondary | ICD-10-CM | POA: Diagnosis not present

## 2015-06-29 DIAGNOSIS — R040 Epistaxis: Secondary | ICD-10-CM | POA: Diagnosis not present

## 2015-07-02 ENCOUNTER — Other Ambulatory Visit (HOSPITAL_COMMUNITY): Payer: Self-pay | Admitting: Emergency Medicine

## 2015-07-02 DIAGNOSIS — C649 Malignant neoplasm of unspecified kidney, except renal pelvis: Secondary | ICD-10-CM

## 2015-07-04 DIAGNOSIS — D638 Anemia in other chronic diseases classified elsewhere: Secondary | ICD-10-CM | POA: Insufficient documentation

## 2015-07-04 DIAGNOSIS — N189 Chronic kidney disease, unspecified: Secondary | ICD-10-CM

## 2015-07-04 DIAGNOSIS — D631 Anemia in chronic kidney disease: Secondary | ICD-10-CM

## 2015-07-04 HISTORY — DX: Chronic kidney disease, unspecified: N18.9

## 2015-07-04 HISTORY — DX: Chronic kidney disease, unspecified: D63.1

## 2015-07-04 LAB — CHROMOSOME ANALYSIS, BONE MARROW

## 2015-07-05 ENCOUNTER — Other Ambulatory Visit (HOSPITAL_COMMUNITY): Payer: Self-pay | Admitting: Emergency Medicine

## 2015-07-05 ENCOUNTER — Ambulatory Visit (INDEPENDENT_AMBULATORY_CARE_PROVIDER_SITE_OTHER): Payer: Self-pay | Admitting: Pharmacist

## 2015-07-05 DIAGNOSIS — I129 Hypertensive chronic kidney disease with stage 1 through stage 4 chronic kidney disease, or unspecified chronic kidney disease: Secondary | ICD-10-CM | POA: Diagnosis not present

## 2015-07-05 DIAGNOSIS — Z7901 Long term (current) use of anticoagulants: Secondary | ICD-10-CM

## 2015-07-05 DIAGNOSIS — D649 Anemia, unspecified: Secondary | ICD-10-CM | POA: Diagnosis not present

## 2015-07-05 DIAGNOSIS — C649 Malignant neoplasm of unspecified kidney, except renal pelvis: Secondary | ICD-10-CM

## 2015-07-05 DIAGNOSIS — Z5181 Encounter for therapeutic drug level monitoring: Secondary | ICD-10-CM

## 2015-07-05 DIAGNOSIS — Z954 Presence of other heart-valve replacement: Secondary | ICD-10-CM

## 2015-07-05 DIAGNOSIS — Z952 Presence of prosthetic heart valve: Secondary | ICD-10-CM

## 2015-07-05 DIAGNOSIS — Z940 Kidney transplant status: Secondary | ICD-10-CM | POA: Diagnosis not present

## 2015-07-05 DIAGNOSIS — E785 Hyperlipidemia, unspecified: Secondary | ICD-10-CM | POA: Diagnosis not present

## 2015-07-05 DIAGNOSIS — Z85528 Personal history of other malignant neoplasm of kidney: Secondary | ICD-10-CM | POA: Diagnosis not present

## 2015-07-05 DIAGNOSIS — N189 Chronic kidney disease, unspecified: Secondary | ICD-10-CM | POA: Diagnosis not present

## 2015-07-05 LAB — POCT INR: INR: 2.2

## 2015-07-05 LAB — OCCULT BLOOD X 1 CARD TO LAB, STOOL
Fecal Occult Bld: NEGATIVE
Fecal Occult Bld: NEGATIVE
Fecal Occult Bld: NEGATIVE

## 2015-07-05 NOTE — Progress Notes (Signed)
No charge for labs or visit - INR check through home monitoring system  

## 2015-07-06 ENCOUNTER — Encounter (HOSPITAL_COMMUNITY): Payer: Self-pay | Admitting: Hematology & Oncology

## 2015-07-06 ENCOUNTER — Encounter (HOSPITAL_BASED_OUTPATIENT_CLINIC_OR_DEPARTMENT_OTHER): Payer: Medicare Other | Admitting: Hematology & Oncology

## 2015-07-06 VITALS — BP 227/73 | HR 78 | Temp 98.0°F | Resp 18 | Wt 190.1 lb

## 2015-07-06 DIAGNOSIS — D631 Anemia in chronic kidney disease: Secondary | ICD-10-CM

## 2015-07-06 DIAGNOSIS — N183 Chronic kidney disease, stage 3 unspecified: Secondary | ICD-10-CM

## 2015-07-06 DIAGNOSIS — E291 Testicular hypofunction: Secondary | ICD-10-CM

## 2015-07-06 DIAGNOSIS — N189 Chronic kidney disease, unspecified: Secondary | ICD-10-CM | POA: Diagnosis not present

## 2015-07-06 DIAGNOSIS — Z7901 Long term (current) use of anticoagulants: Secondary | ICD-10-CM

## 2015-07-06 DIAGNOSIS — D899 Disorder involving the immune mechanism, unspecified: Secondary | ICD-10-CM | POA: Diagnosis not present

## 2015-07-06 DIAGNOSIS — D849 Immunodeficiency, unspecified: Secondary | ICD-10-CM

## 2015-07-06 DIAGNOSIS — C649 Malignant neoplasm of unspecified kidney, except renal pelvis: Secondary | ICD-10-CM

## 2015-07-06 DIAGNOSIS — Z940 Kidney transplant status: Secondary | ICD-10-CM

## 2015-07-06 NOTE — Patient Instructions (Addendum)
[place] at [facility] Discharge Instructions  RECOMMENDATIONS MADE BY THE CONSULTANT AND ANY TEST RESULTS WILL BE SENT TO YOUR REFERRING PHYSICIAN.   Exam and discussion by Dr Whitney Muse today Labs monthly  Your bone marrow looks good. Iron through your IV will be much better than taking it orally If your iron that you have in your bone marrow is low we will call you to get you to come in for iron transfusion   Aranesp every 14 days  Referral to urology they will call you with this appt  Return to see the doctor in 1 month Please call the clinic if you have any questions or concerns     Thank you for choosing [place] at [facility] to provide your oncology and hematology care.  To afford each patient quality time with our provider, please arrive at least 15 minutes before your scheduled appointment time.   Beginning January 23rd 2017 lab work for the [facility] will be done in the  Main lab at [facility] on 1st floor. If you have a lab appointment with the [facility] please come in thru the  Main Entrance and check in at the main information desk  You need to re-schedule your appointment should you arrive 10 or more minutes late.  We strive to give you quality time with our providers, and arriving late affects you and other patients whose appointments are after yours.  Also, if you no show three or more times for appointments you may be dismissed from the clinic at the providers discretion.     Again, thank you for choosing [facility].  Our hope is that these requests will decrease the amount of time that you wait before being seen by our physicians.       _____________________________________________________________  Should you have questions after your visit to [facility], please contact our office at [phone] between the hours of 8:30 a.m. and 4:30 p.m.  Voicemails left after 4:30 p.m. will not  be returned until the following business day.  For prescription refill requests, have your pharmacy contact our office.         Resources For Cancer Patients and their Caregivers ? American Cancer Society: Can assist with transportation, wigs, general needs, runs Look Good Feel Better.        930-202-4403 ? Cancer Care: Provides financial assistance, online support groups, medication/co-pay assistance.  1-800-813-HOPE (980)564-6803) ? [employer] Assists [place] cancer patients and their families through emotional , educational and financial support.  641-451-2181 ? [place] DSS Where to apply for food stamps, Medicaid and utility assistance. 431 213 3022 ? [facility]: Transportation to medical appointments. 602-469-7220 ? Social Security Administration: May apply for disability if have a Stage IV cancer. 907-655-6241 947 841 1565 ? [employer], Disability and Transit Services: Assists with nutrition, care and transit needs. 818-028-4501

## 2015-07-06 NOTE — Progress Notes (Signed)
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 Moderate arterionephrosclerosis with focal features of accelerated hypertension related injury, moderate diabetic nephropathy, recurrent IgA nephropathy   HISTORY OF PRESENTING ILLNESS:  Jeremy [name] 60 y.o. male is here for follow-up of anemia secondary to CKD. . Jeremy [name] returns to the [place] [facility] alone today. He is here to review laboratory studies and BMBX.   He notes that he has talked to doctors about testosterone replacement, but reports that they have said there are "no set goals."  [facility] has him on a 150 dose of aranesp. He confirms that he has had IV iron, with the last time probably a month and a half or two months ago. He notes that the last time he received it, he could tell a difference immediately, the next day.  He notes that "once I had the transplant, it felt like everything started falling apart." he says he feels drained all the time, and it's all he can do to finish his job even when he only works 4 hours.  He notes that the last time he had a heart stress test was a while ago. He says he's had 2 or 3 stress tests done, has had an echo done, has had ultrasounds. He notes that the results always come back that his heart is fine. He notes that when he gets really stressed, he can feel his blood pressure start to build up, so he tries to calm down.  He says it's all he can do to walk to the mailbox, let alone exercise.  He  is willing to talk to a urologist if necessary, but would rather see somebody locally. He notes he would go to [place] if necessary.  He has an appointment set up for April 6th for another Aranesp treatment. He would like that to be set up for April the 5th.   MEDICAL HISTORY:  Past Medical History  Diagnosis Date  . Kidney disease     Chronic kidney disease secondary to IgA nephropathy diagnosed when he was 24. 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  Marland Kitchen Hx of colonoscopy     2009 by Dr. Laural Golden. Normal except for small submucosal lipoma. No evidence of polyps or colitis.  . Hyperlipidemia   . Hypertension   . Arthritis   . Chronic anticoagulation 06/2012  . Dialysis patient [facility]) 01 01 2008  . Blood transfusion without reported diagnosis   . Cancer ([place])   . OSA (obstructive sleep apnea)   . Anemia     SURGICAL HISTORY: Past Surgical History  Procedure Laterality Date  . Dg av dialysis  shunt access exist*l* or      left arm  . Back surgery    . Coronary angioplasty with stent placement    . Kidney transplant    . Aortic valve replacement  3 /11 /2014    At [facility]    SOCIAL HISTORY: Social History   Social History  . Marital  Status: Divorced    Spouse Name: N/A  . Number of Children: N/A  . Years of Education: N/A   Occupational History  . courier     [employer]   Social History Main Topics  . Smoking status: Never Smoker   . Smokeless tobacco: Not on file  . Alcohol Use: No  . Drug Use: No  . Sexual Activity: Not on file   Other Topics Concern  . Not on file   Social History Narrative   Single   1 daughter 41    Divorced 13 y/o daughter; son killed in a car accident when he was 74, a while back 2 grandsons, both aged 25 Don't smoke, don't drink; "cuss a little bit, but other than that ..." Used to work for [employer], but transplant has  cost him everything No energy, dialysis  No hobbies right now; no energy to do anything Used to like doing woodworking; furniture, cabinets, keeping busy Likes fixing and flipping cars  FAMILY HISTORY: History reviewed. No pertinent family history. indicated that his mother is deceased. He indicated that his father is alive. He reported the following about his sister: health unknown. He reported the following about his brother: unknown.   Father still living; 26-80 Not healthy, has a touch of dementia and heart issues  Sister died of kidney issues when she was very young Other than that, no one with kidney problems  Mother died with cancer; maybe pancreatic, unsure of type; aged 36-53  Brother in perfect health, aged 82  ALLERGIES:  is allergic to atorvastatin and lipitor.  MEDICATIONS:  Current Outpatient Prescriptions  Medication Sig Dispense Refill  . acetaminophen (TYLENOL) 325 MG tablet Take 650 mg by mouth every 6 (six) hours as needed.    . Ascorbic Acid (VITAMIN C) 100 MG tablet Take 100 mg by mouth daily.    . B Complex Vitamins (VITAMIN-B COMPLEX) TABS Take 1 tablet by mouth.    . calcitRIOL (ROCALTROL) 0.25 MCG capsule Take 0.25 mcg by mouth. Monday - Wednesday-friday    . CALCIUM-MAGNESIUM-ZINC PO Take 1 tablet by mouth daily.    . cholecalciferol (VITAMIN D) 1000 UNITS tablet Take 1,000 Units by mouth daily.    Marland Kitchen Dextrose-Fructose-Sod Citrate [phone] MG CHEW Chew 1 tablet by mouth.    . dicyclomine (BENTYL) 10 MG capsule TAKE 1 CAPSULE BY MOUTH TWICE DAILY. 60 capsule 5  . doxazosin (CARDURA) 1 MG tablet Take 1 mg by mouth daily. Reported on 06/12/2015  3  . febuxostat (ULORIC) 40 MG tablet Take 40 mg by mouth daily.    . folic acid-vitamin b complex-vitamin c-selenium-zinc (DIALYVITE) 3 MG TABS Take 1 tablet by mouth daily.      . furosemide (LASIX) 80 MG tablet Take 80 mg by mouth as needed.    . labetalol (NORMODYNE) 300 MG tablet Take 600 mg by mouth 3 (three)  times daily.    . magnesium oxide (MAG-OX) 400 (241.3 MG) MG tablet Take 400 mg by mouth 2 (two) times daily.  1  . Multiple Vitamin (MULTI-VITAMINS) TABS Take by mouth.    . mycophenolate (MYFORTIC) 180 MG EC tablet Take 180 mg by mouth. Take 8 tablets daily    . NIFEdipine (PROCARDIA-XL/ADALAT CC) 60 MG 24 hr tablet Take 60 mg by mouth 2 (two) times daily.     . Omega-3 1000 MG CAPS Take 3 g by mouth.    Marland Kitchen omeprazole (PRILOSEC) 20 MG capsule Take 20 mg by mouth daily.    Marland Kitchen  pravastatin (PRAVACHOL) 40 MG tablet     . predniSONE (DELTASONE) 5 MG tablet Take 5 mg by mouth daily.    Marland Kitchen sulfamethoxazole-trimethoprim (BACTRIM,SEPTRA) 400-80 MG per tablet Take 1 tablet by mouth 3 (three) times a week.    . tacrolimus (PROGRAF) 1 MG capsule Take 2 capsules by mouth 2 (two) times daily.    Marland Kitchen warfarin (COUMADIN) 1 MG tablet TAKE 1-3 TABLETS BY MOUTH ONCE DAILY AS DIRECTED BY ANTICOAGULATION CLINIC. 100 tablet 2  . loratadine (CLARITIN) 10 MG tablet Take 10 mg by mouth. Reported on 06/12/2015    . nitroGLYCERIN (NITROSTAT) 0.4 MG SL tablet Place 1 tablet (0.4 mg total) under the tongue every 5 (five) minutes as needed for chest pain. (Patient not taking: Reported on 06/12/2015) 25 tablet 2   No current facility-administered medications for this visit.    Review of Systems  Constitutional: Positive for malaise/fatigue.  HENT: Negative.   Eyes: Negative.   Respiratory: Negative.   Cardiovascular: Negative.   Gastrointestinal: Negative.   Genitourinary: Negative.   Musculoskeletal: Negative.   Skin: Negative.   Neurological: Positive for weakness.  Endo/Heme/Allergies: Negative.   Psychiatric/Behavioral: Negative.   All other systems reviewed and are negative.  14 point ROS was done and is otherwise as detailed above or in HPI    PHYSICAL EXAMINATION: ECOG PERFORMANCE STATUS: 1 - Symptomatic but completely ambulatory  Filed Vitals:   07/06/15 1100  BP: 227/73  Pulse: 78  Temp: 98 F (36.7  C)  Resp: 18   Filed Weights   07/06/15 1100  Weight: 190 lb 1.6 oz (86.229 kg)    Physical Exam  Constitutional: He is oriented to person, place, and time and well-developed, well-nourished, and in no distress.  HENT:  Head: Normocephalic and atraumatic.  Nose: Nose normal.  Mouth/Throat: Oropharynx is clear and moist. No oropharyngeal exudate.  Eyes: Conjunctivae and EOM are normal. Pupils are equal, round, and reactive to light. Right eye exhibits no discharge. Left eye exhibits no discharge. No scleral icterus.  Neck: Normal range of motion. Neck supple. No tracheal deviation present. No thyromegaly present.  Cardiovascular: Normal rate, regular rhythm, normal heart sounds and intact distal pulses.  Exam reveals no gallop and no friction rub.   No murmur heard. Has a mechanical heart valve.  Pulmonary/Chest: Effort normal and breath sounds normal. He has no wheezes. He has no rales.  Abdominal: Soft. Bowel sounds are normal. He exhibits no distension and no mass. There is no tenderness. There is no rebound and no guarding.  Musculoskeletal: Normal range of motion. He exhibits no edema.  Lymphadenopathy:    He has no cervical adenopathy.  Neurological: He is alert and oriented to person, place, and time. He has normal reflexes. No cranial nerve deficit. Gait normal. Coordination normal.  Skin: Skin is warm and dry. No rash noted.  Psychiatric: Mood, memory, affect and judgment normal.  Nursing note and vitals reviewed.   LABORATORY DATA:  I have reviewed the data as listed Lab Results  Component Value Date   WBC 5.4 06/25/2015   HGB 9.5* 06/25/2015   HCT 32.2* 06/25/2015   MCV 95.5 06/25/2015   PLT 121* 06/25/2015   CMP     Component Value Date/Time   NA 139 11/28/2011   NA 138 01/06/2008 0720   K 5.0 12/06/2011   CL 93* 01/06/2008 0720   CO2 32 01/06/2008 0720   GLUCOSE 98 01/06/2008 0720   BUN 35* 12/06/2011   BUN 30* 01/06/2008  0720   CREATININE 8.6*  12/06/2011   CREATININE 8.61* 01/06/2008 0720   CALCIUM 9.5 01/06/2008 0720   AST 20 12/06/2011   ALT 25 12/06/2011   ALKPHOS 98 12/06/2011   GFRNONAA 7* 01/06/2008 0720   GFRAA * 01/06/2008 0720    8        The eGFR has been calculated using the MDRD equation. This calculation has not been validated in all clinical   Results for XAYVION, SHIRAH (MRN [id number]) as of 08/10/2015 16:54  Ref. Range 06/12/2015 16:00 06/12/2015 16:00 06/13/2015 00:00  LDH Latest Ref Range: 98-192 U/L 297 (H)    Iron Latest Ref Range: 45-182 ug/dL 47    UIBC Latest Units: ug/dL 227    TIBC Latest Ref Range: 250-450 ug/dL 274    Saturation Ratios Latest Ref Range: 17.9-39.5 % 17 (L)    Ferritin Latest Ref Range: 24-336 ng/mL 428 (H)    Folate Latest Ref Range: >5.9 ng/mL 31.8    CRP Latest Ref Range: <1.0 mg/dL <0.5    Vitamin B12 Latest Ref Range: 180-914 pg/mL 533    Total Protein ELP Latest Ref Range: 6.0-8.5 g/dL 5.9 (L) 5.8 (L)   Albumin ELP Latest Ref Range: 2.9-4.4 g/dL 3.9    Globulin, Total Latest Ref Range: 2.2-3.9 g/dL 2.0 (L)    A/G Ratio Latest Ref Range: 0.7-1.7  1.9 (H)    Alpha-1-Globulin Latest Ref Range: 0.0-0.4 g/dL 0.3    Alpha-2-Globulin Latest Ref Range: 0.4-1.0 g/dL 0.5    Beta Globulin Latest Ref Range: 0.7-1.3 g/dL 0.8    Gamma Globulin Latest Ref Range: 0.4-1.8 g/dL 0.5    M-SPIKE, % Latest Ref Range: Not Observed g/dL Not Observed    SPE Interp. Unknown Comment    Comment Unknown Comment    IgG (Immunoglobin G), Serum Latest Ref Range: [phone] mg/dL 560 (L) 592 (L)   IgA Latest Ref Range: 90-386 mg/dL 158 159   IgM, Serum Latest Ref Range: 20-172 mg/dL 37 37   Kappa free light chain Latest Ref Range: 3.30-19.40 mg/L 51.23 (H)    Lamda free light chains Latest Ref Range: 5.71-26.30 mg/L 29.10 (H)    Kappa, lamda light chain ratio Latest Ref Range: 0.26-1.65  1.76 (H)    WBC Latest Ref Range: 4.0-10.5 K/uL 7.6    RBC Latest Ref Range: 4.22-5.81 MIL/uL 3.55 (L)    Hemoglobin Latest  Ref Range: 13.0-17.0 g/dL 9.9 (L)    HCT Latest Ref Range: 39.0-52.0 % 33.6 (L)    MCV Latest Ref Range: 78.0-100.0 fL 94.6    MCH Latest Ref Range: 26.0-34.0 pg 27.9    MCHC Latest Ref Range: 30.0-36.0 g/dL 29.5 (L)    RDW Latest Ref Range: 11.5-15.5 % 14.6    Platelets Latest Ref Range: 150-400 K/uL 159    Neutrophils Latest Units: % 84    Lymphocytes Latest Units: % 7    Monocytes Relative Latest Units: % 7    Eosinophil Latest Units: % 2    Basophil Latest Units: % 0    NEUT# Latest Ref Range: 1.7-7.7 K/uL 6.4    Lymphocyte # Latest Ref Range: 0.7-4.0 K/uL 0.5 (L)    Monocyte # Latest Ref Range: 0.1-1.0 K/uL 0.5    Eosinophils Absolute Latest Ref Range: 0.0-0.7 K/uL 0.2    Basophils Absolute Latest Ref Range: 0.0-0.1 K/uL 0.0    RBC. Latest Ref Range: 4.22-5.81 MIL/uL 3.54 (L)    Retic Ct Pct Latest Ref Range: 0.4-3.1 % 3.1  Retic Count, Manual Latest Ref Range: 19.0-186.0 K/uL 109.7    Haptoglobin Latest Ref Range: 34-200 mg/dL 13 (L)    Sed Rate Latest Ref Range: 0-16 mm/hr 4    INR Unknown   2.9  Testosterone Latest Ref Range: [phone] ng/dL 267 (L)    Testosterone Free Latest Ref Range: 7.2-24.0 pg/mL 2.7 (L)    Immunofixation Result, Serum Unknown  Comment            ASSESSMENT & PLAN:  Persistent Anemia S/P Renal transplant CKD Aranesp growth factor support q 2 weeks Chronic anticoagulation secondary to mechanical St. Jude AVR Chronic Immunosuppression BMBX WNL Hypogonadism  60 year old with complicated medical history.  His anemia is certainly multifactorial.  We reviewed his entire workup in detail.  We discussed potential low dose testosterone replacement as a way to improve erythropoiesis. I presented risks/benefits of this. His major concern is his QOL and severe fatigue.  I have advised him that his fatigue may be from many issues not just his anemia. Symptoms from low testosterone are somewhat subjective and I would recommend replacement starting slowly  and only to improve hematopoiesis and edge his hormone levels into the normal range. I have also recommended that we could consult with urology as well.   We discussed his chronic anticoagulation and the possibility that he could intermittently have GI related blood loss as well.   Labs will be ordered and followed by his nephrologist at [facility]. We will institute aranesp for the patient here so he no longer has to travel as far.    Orders Placed This Encounter  Procedures  . Protein / Creatinine Ratio, Urine    Standing Status: Standing     Number of Occurrences: 20     Standing Expiration Date: 07/05/2017  . Urinalysis, Routine w reflex microscopic    Standing Status: Standing     Number of Occurrences: 20     Standing Expiration Date: 07/05/2017  . Phosphorus    Standing Status: Standing     Number of Occurrences: 20     Standing Expiration Date: 07/05/2017  . CBC with Differential    Standing Status: Standing     Number of Occurrences: 20     Standing Expiration Date: 07/05/2017  . Comprehensive metabolic panel    Standing Status: Standing     Number of Occurrences: 20     Standing Expiration Date: 07/05/2017  . PTH, intact and calcium    Standing Status: Standing     Number of Occurrences: 20     Standing Expiration Date: 07/05/2017  . Magnesium    Standing Status: Standing     Number of Occurrences: 20     Standing Expiration Date: 07/05/2017  . Tacrolimus level    Standing Status: Standing     Number of Occurrences: 20     Standing Expiration Date: 07/05/2017  . Iron and TIBC    Standing Status: Standing     Number of Occurrences: 20     Standing Expiration Date: 07/05/2017  . Ferritin    Standing Status: Standing     Number of Occurrences: 20     Standing Expiration Date: 07/05/2017  . Uric acid    Standing Status: Standing     Number of Occurrences: 20     Standing Expiration Date: 07/05/2017  . VITAMIN D 25 Hydroxy (Vit-D Deficiency, Fractures)    Standing Status:  Standing     Number of Occurrences: 20  Standing Expiration Date: 07/05/2017  . CBC    Standing Status: Standing     Number of Occurrences: 20     Standing Expiration Date: 07/05/2016   All questions were answered. The patient knows to call the clinic with any problems, questions or concerns.  This document serves as a record of services personally performed by Ancil Linsey, MD. It was created on her behalf by Toni Amend, a trained medical scribe. The creation of this record is based on the scribe's personal observations and the provider's statements to them. This document has been checked and approved by the attending provider.  I have reviewed the above documentation for accuracy and completeness and I agree with the above.  This note was electronically signed.  Molli Hazard, MD  07/06/2015 11:39 AM

## 2015-07-11 ENCOUNTER — Encounter (HOSPITAL_COMMUNITY): Payer: Medicare Other | Attending: Hematology & Oncology

## 2015-07-11 ENCOUNTER — Encounter: Payer: Self-pay | Admitting: Family Medicine

## 2015-07-11 ENCOUNTER — Encounter (HOSPITAL_COMMUNITY): Payer: Medicare Other

## 2015-07-11 ENCOUNTER — Ambulatory Visit (HOSPITAL_COMMUNITY): Payer: Medicare Other

## 2015-07-11 ENCOUNTER — Ambulatory Visit (INDEPENDENT_AMBULATORY_CARE_PROVIDER_SITE_OTHER): Payer: 59 | Admitting: Pharmacist

## 2015-07-11 VITALS — BP 151/62 | HR 65 | Temp 98.0°F | Resp 18

## 2015-07-11 DIAGNOSIS — N183 Chronic kidney disease, stage 3 unspecified: Secondary | ICD-10-CM

## 2015-07-11 DIAGNOSIS — Z7901 Long term (current) use of anticoagulants: Secondary | ICD-10-CM | POA: Insufficient documentation

## 2015-07-11 DIAGNOSIS — Z5181 Encounter for therapeutic drug level monitoring: Secondary | ICD-10-CM | POA: Diagnosis not present

## 2015-07-11 DIAGNOSIS — Z992 Dependence on renal dialysis: Secondary | ICD-10-CM | POA: Insufficient documentation

## 2015-07-11 DIAGNOSIS — Z952 Presence of prosthetic heart valve: Secondary | ICD-10-CM | POA: Diagnosis not present

## 2015-07-11 DIAGNOSIS — Z9889 Other specified postprocedural states: Secondary | ICD-10-CM | POA: Insufficient documentation

## 2015-07-11 DIAGNOSIS — Z85528 Personal history of other malignant neoplasm of kidney: Secondary | ICD-10-CM | POA: Diagnosis not present

## 2015-07-11 DIAGNOSIS — I129 Hypertensive chronic kidney disease with stage 1 through stage 4 chronic kidney disease, or unspecified chronic kidney disease: Secondary | ICD-10-CM | POA: Insufficient documentation

## 2015-07-11 DIAGNOSIS — D631 Anemia in chronic kidney disease: Secondary | ICD-10-CM | POA: Diagnosis not present

## 2015-07-11 DIAGNOSIS — D649 Anemia, unspecified: Secondary | ICD-10-CM | POA: Diagnosis not present

## 2015-07-11 DIAGNOSIS — M1990 Unspecified osteoarthritis, unspecified site: Secondary | ICD-10-CM | POA: Insufficient documentation

## 2015-07-11 DIAGNOSIS — Z954 Presence of other heart-valve replacement: Secondary | ICD-10-CM

## 2015-07-11 DIAGNOSIS — Z940 Kidney transplant status: Secondary | ICD-10-CM | POA: Diagnosis not present

## 2015-07-11 DIAGNOSIS — G4733 Obstructive sleep apnea (adult) (pediatric): Secondary | ICD-10-CM | POA: Diagnosis not present

## 2015-07-11 DIAGNOSIS — N186 End stage renal disease: Secondary | ICD-10-CM

## 2015-07-11 DIAGNOSIS — N189 Chronic kidney disease, unspecified: Secondary | ICD-10-CM

## 2015-07-11 DIAGNOSIS — Z79899 Other long term (current) drug therapy: Secondary | ICD-10-CM | POA: Diagnosis not present

## 2015-07-11 DIAGNOSIS — E785 Hyperlipidemia, unspecified: Secondary | ICD-10-CM | POA: Diagnosis not present

## 2015-07-11 LAB — POCT INR: INR: 2.1

## 2015-07-11 LAB — CBC
HEMATOCRIT: 31.7 % — AB (ref 39.0–52.0)
Hemoglobin: 9.7 g/dL — ABNORMAL LOW (ref 13.0–17.0)
MCH: 28.4 pg (ref 26.0–34.0)
MCHC: 30.6 g/dL (ref 30.0–36.0)
MCV: 93 fL (ref 78.0–100.0)
Platelets: 131 10*3/uL — ABNORMAL LOW (ref 150–400)
RBC: 3.41 MIL/uL — ABNORMAL LOW (ref 4.22–5.81)
RDW: 14.5 % (ref 11.5–15.5)
WBC: 3.7 10*3/uL — ABNORMAL LOW (ref 4.0–10.5)

## 2015-07-11 MED ORDER — DARBEPOETIN ALFA 150 MCG/0.3ML IJ SOSY
150.0000 ug | PREFILLED_SYRINGE | Freq: Once | INTRAMUSCULAR | Status: AC
  Administered 2015-07-11: 150 ug via SUBCUTANEOUS

## 2015-07-11 MED ORDER — DARBEPOETIN ALFA 150 MCG/0.3ML IJ SOSY
PREFILLED_SYRINGE | INTRAMUSCULAR | Status: AC
  Filled 2015-07-11: qty 0.3

## 2015-07-11 NOTE — Progress Notes (Signed)
Patient checks INR at home with Home INR monitoring.  Billing once per month interupertation fee.  Patient diagnosis - s/p AVR  Procedure code if [id number]

## 2015-07-11 NOTE — Progress Notes (Unsigned)
Jeremy [name] presents today for injection per MD orders. Aranesp 189mcg administered SQ in right Abdomen. Administration without incident. Patient tolerated well.

## 2015-07-11 NOTE — Patient Instructions (Signed)
[place] at [facility] Discharge Instructions  RECOMMENDATIONS MADE BY THE CONSULTANT AND ANY TEST RESULTS WILL BE SENT TO YOUR REFERRING PHYSICIAN.  Aranesp 171mcg today.    Thank you for choosing [place] at [facility] to provide your oncology and hematology care.  To afford each patient quality time with our provider, please arrive at least 15 minutes before your scheduled appointment time.   Beginning January 23rd 2017 lab work for the [facility] will be done in the  Main lab at [facility] on 1st floor. If you have a lab appointment with the [facility] please come in thru the  Main Entrance and check in at the main information desk  You need to re-schedule your appointment should you arrive 10 or more minutes late.  We strive to give you quality time with our providers, and arriving late affects you and other patients whose appointments are after yours.  Also, if you no show three or more times for appointments you may be dismissed from the clinic at the providers discretion.     Again, thank you for choosing [facility].  Our hope is that these requests will decrease the amount of time that you wait before being seen by our physicians.       _____________________________________________________________  Should you have questions after your visit to [facility], please contact our office at [phone] between the hours of 8:30 a.m. and 4:30 p.m.  Voicemails left after 4:30 p.m. will not be returned until the following business day.  For prescription refill requests, have your pharmacy contact our office.         Resources For Cancer Patients and their Caregivers ? American Cancer Society: Can assist with transportation, wigs, general needs, runs Look Good Feel Better.        650-513-1369 ? Cancer Care: Provides financial assistance, online support groups, medication/co-pay assistance.   1-800-813-HOPE (272)412-4513) ? [place] Assists [place] cancer patients and their families through emotional , educational and financial support.  859-767-9010 ? [place] DSS Where to apply for food stamps, Medicaid and utility assistance. (915)773-7022 ? RCATS: Transportation to medical appointments. 9155373768 ? Social Security Administration: May apply for disability if have a Stage IV cancer. ([phone] 714-649-8327 ? [employer], Disability and Transit Services: Assists with nutrition, care and transit needs. 636-848-3626

## 2015-07-17 ENCOUNTER — Encounter (HOSPITAL_COMMUNITY): Payer: Self-pay

## 2015-07-18 DIAGNOSIS — Z954 Presence of other heart-valve replacement: Secondary | ICD-10-CM | POA: Diagnosis not present

## 2015-07-18 DIAGNOSIS — Z7901 Long term (current) use of anticoagulants: Secondary | ICD-10-CM | POA: Diagnosis not present

## 2015-07-18 LAB — POCT INR: INR: 2.5

## 2015-07-23 ENCOUNTER — Ambulatory Visit (INDEPENDENT_AMBULATORY_CARE_PROVIDER_SITE_OTHER): Payer: Self-pay | Admitting: Pharmacist

## 2015-07-23 DIAGNOSIS — Z5181 Encounter for therapeutic drug level monitoring: Secondary | ICD-10-CM

## 2015-07-23 DIAGNOSIS — Z7901 Long term (current) use of anticoagulants: Secondary | ICD-10-CM

## 2015-07-23 DIAGNOSIS — Z952 Presence of prosthetic heart valve: Secondary | ICD-10-CM

## 2015-07-23 DIAGNOSIS — Z954 Presence of other heart-valve replacement: Secondary | ICD-10-CM

## 2015-07-24 ENCOUNTER — Telehealth (HOSPITAL_COMMUNITY): Payer: Self-pay | Admitting: Emergency Medicine

## 2015-07-24 ENCOUNTER — Encounter (HOSPITAL_COMMUNITY): Payer: Self-pay | Admitting: Hematology & Oncology

## 2015-07-24 NOTE — Telephone Encounter (Signed)
Dr Gladys Damme wants to try to start testosterone and manage it herself, if he is responding from an anemia standpoint then she will refer him to urology to be managed chronically

## 2015-07-25 ENCOUNTER — Ambulatory Visit (HOSPITAL_COMMUNITY): Payer: Medicare Other

## 2015-07-25 ENCOUNTER — Other Ambulatory Visit (HOSPITAL_COMMUNITY): Payer: Self-pay | Admitting: Hematology & Oncology

## 2015-07-25 ENCOUNTER — Other Ambulatory Visit (HOSPITAL_COMMUNITY): Payer: Medicare Other

## 2015-07-25 DIAGNOSIS — E291 Testicular hypofunction: Secondary | ICD-10-CM

## 2015-07-25 HISTORY — DX: Testicular hypofunction: E29.1

## 2015-07-26 ENCOUNTER — Ambulatory Visit (INDEPENDENT_AMBULATORY_CARE_PROVIDER_SITE_OTHER): Payer: Self-pay | Admitting: Pharmacist

## 2015-07-26 DIAGNOSIS — Z5181 Encounter for therapeutic drug level monitoring: Secondary | ICD-10-CM

## 2015-07-26 DIAGNOSIS — Z952 Presence of prosthetic heart valve: Secondary | ICD-10-CM

## 2015-07-26 DIAGNOSIS — Z7901 Long term (current) use of anticoagulants: Secondary | ICD-10-CM

## 2015-07-26 DIAGNOSIS — Z954 Presence of other heart-valve replacement: Secondary | ICD-10-CM

## 2015-07-26 LAB — POCT INR: INR: 2.7

## 2015-07-27 ENCOUNTER — Other Ambulatory Visit (HOSPITAL_COMMUNITY): Payer: Self-pay | Admitting: Emergency Medicine

## 2015-07-27 ENCOUNTER — Encounter (HOSPITAL_COMMUNITY): Payer: Medicare Other

## 2015-07-27 ENCOUNTER — Encounter (HOSPITAL_BASED_OUTPATIENT_CLINIC_OR_DEPARTMENT_OTHER): Payer: Medicare Other

## 2015-07-27 VITALS — BP 168/61 | HR 66 | Temp 98.4°F | Resp 20

## 2015-07-27 DIAGNOSIS — N183 Chronic kidney disease, stage 3 unspecified: Secondary | ICD-10-CM

## 2015-07-27 DIAGNOSIS — D631 Anemia in chronic kidney disease: Secondary | ICD-10-CM

## 2015-07-27 DIAGNOSIS — I129 Hypertensive chronic kidney disease with stage 1 through stage 4 chronic kidney disease, or unspecified chronic kidney disease: Secondary | ICD-10-CM | POA: Diagnosis not present

## 2015-07-27 DIAGNOSIS — D649 Anemia, unspecified: Secondary | ICD-10-CM | POA: Diagnosis not present

## 2015-07-27 DIAGNOSIS — Z992 Dependence on renal dialysis: Secondary | ICD-10-CM

## 2015-07-27 DIAGNOSIS — C649 Malignant neoplasm of unspecified kidney, except renal pelvis: Secondary | ICD-10-CM

## 2015-07-27 DIAGNOSIS — Z85528 Personal history of other malignant neoplasm of kidney: Secondary | ICD-10-CM | POA: Diagnosis not present

## 2015-07-27 DIAGNOSIS — N189 Chronic kidney disease, unspecified: Principal | ICD-10-CM

## 2015-07-27 DIAGNOSIS — Z940 Kidney transplant status: Secondary | ICD-10-CM | POA: Diagnosis not present

## 2015-07-27 DIAGNOSIS — N186 End stage renal disease: Secondary | ICD-10-CM

## 2015-07-27 DIAGNOSIS — E785 Hyperlipidemia, unspecified: Secondary | ICD-10-CM | POA: Diagnosis not present

## 2015-07-27 LAB — CBC WITH DIFFERENTIAL/PLATELET
Basophils Absolute: 0 10*3/uL (ref 0.0–0.1)
Basophils Relative: 0 %
Eosinophils Absolute: 0.1 10*3/uL (ref 0.0–0.7)
Eosinophils Relative: 4 %
HEMATOCRIT: 32.9 % — AB (ref 39.0–52.0)
HEMOGLOBIN: 10 g/dL — AB (ref 13.0–17.0)
LYMPHS ABS: 0.7 10*3/uL (ref 0.7–4.0)
Lymphocytes Relative: 24 %
MCH: 27.9 pg (ref 26.0–34.0)
MCHC: 30.4 g/dL (ref 30.0–36.0)
MCV: 91.9 fL (ref 78.0–100.0)
MONOS PCT: 8 %
Monocytes Absolute: 0.2 10*3/uL (ref 0.1–1.0)
NEUTROS ABS: 2 10*3/uL (ref 1.7–7.7)
NEUTROS PCT: 64 %
Platelets: 104 10*3/uL — ABNORMAL LOW (ref 150–400)
RBC: 3.58 MIL/uL — ABNORMAL LOW (ref 4.22–5.81)
RDW: 14.6 % (ref 11.5–15.5)
WBC: 3.2 10*3/uL — ABNORMAL LOW (ref 4.0–10.5)

## 2015-07-27 LAB — COMPREHENSIVE METABOLIC PANEL
ALK PHOS: 123 U/L (ref 38–126)
ALT: 22 U/L (ref 17–63)
ANION GAP: 11 (ref 5–15)
AST: 25 U/L (ref 15–41)
Albumin: 3.6 g/dL (ref 3.5–5.0)
BILIRUBIN TOTAL: 0.4 mg/dL (ref 0.3–1.2)
BUN: 78 mg/dL — ABNORMAL HIGH (ref 6–20)
CALCIUM: 9.3 mg/dL (ref 8.9–10.3)
CO2: 15 mmol/L — ABNORMAL LOW (ref 22–32)
Chloride: 117 mmol/L — ABNORMAL HIGH (ref 101–111)
Creatinine, Ser: 2.58 mg/dL — ABNORMAL HIGH (ref 0.61–1.24)
GFR calc non Af Amer: 26 mL/min — ABNORMAL LOW (ref >60)
GFR, EST AFRICAN AMERICAN: 30 mL/min — AB (ref >60)
Glucose, Bld: 126 mg/dL — ABNORMAL HIGH (ref 65–99)
Potassium: 5 mmol/L (ref 3.5–5.1)
Sodium: 143 mmol/L (ref 135–145)
TOTAL PROTEIN: 6.1 g/dL — AB (ref 6.5–8.1)

## 2015-07-27 LAB — URINALYSIS, ROUTINE W REFLEX MICROSCOPIC
Bilirubin Urine: NEGATIVE
GLUCOSE, UA: NEGATIVE mg/dL
KETONES UR: NEGATIVE mg/dL
LEUKOCYTES UA: NEGATIVE
Nitrite: NEGATIVE
PH: 5.5 (ref 5.0–8.0)
Protein, ur: 100 mg/dL — AB
Specific Gravity, Urine: 1.02 (ref 1.005–1.030)

## 2015-07-27 LAB — URINE MICROSCOPIC-ADD ON
SQUAMOUS EPITHELIAL / LPF: NONE SEEN
WBC UA: NONE SEEN WBC/hpf (ref 0–5)

## 2015-07-27 LAB — PHOSPHORUS: Phosphorus: 4.5 mg/dL (ref 2.5–4.6)

## 2015-07-27 LAB — PROTEIN / CREATININE RATIO, URINE
Creatinine, Urine: 115.78 mg/dL
Protein Creatinine Ratio: 1.36 mg/mg{Cre} — ABNORMAL HIGH (ref 0.00–0.15)
TOTAL PROTEIN, URINE: 158 mg/dL

## 2015-07-27 LAB — IRON AND TIBC
IRON: 60 ug/dL (ref 45–182)
Saturation Ratios: 24 % (ref 17.9–39.5)
TIBC: 251 ug/dL (ref 250–450)
UIBC: 191 ug/dL

## 2015-07-27 LAB — URIC ACID: Uric Acid, Serum: 8.7 mg/dL — ABNORMAL HIGH (ref 4.4–7.6)

## 2015-07-27 LAB — MAGNESIUM: Magnesium: 2.1 mg/dL (ref 1.7–2.4)

## 2015-07-27 LAB — FERRITIN: Ferritin: 389 ng/mL — ABNORMAL HIGH (ref 24–336)

## 2015-07-27 MED ORDER — DARBEPOETIN ALFA 150 MCG/0.3ML IJ SOSY
PREFILLED_SYRINGE | INTRAMUSCULAR | Status: AC
  Filled 2015-07-27: qty 0.3

## 2015-07-27 MED ORDER — DARBEPOETIN ALFA 150 MCG/0.3ML IJ SOSY
150.0000 ug | PREFILLED_SYRINGE | Freq: Once | INTRAMUSCULAR | Status: AC
  Administered 2015-07-27: 150 ug via SUBCUTANEOUS

## 2015-07-27 NOTE — Progress Notes (Signed)
Jeremy [name] presents today for injection per MD orders. Aranesp 150 mcg administered SQ in right Abdomen. Administration without incident. Patient tolerated well.

## 2015-07-27 NOTE — Patient Instructions (Signed)
[place] at [facility] Discharge Instructions  RECOMMENDATIONS MADE BY THE CONSULTANT AND ANY TEST RESULTS WILL BE SENT TO YOUR REFERRING PHYSICIAN.  Hemoglobin 10.0 today. Aranesp 150 mcg injection given as ordered.  Thank you for choosing [place] at [facility] to provide your oncology and hematology care.  To afford each patient quality time with our provider, please arrive at least 15 minutes before your scheduled appointment time.   Beginning January 23rd 2017 lab work for the [facility] will be done in the  Main lab at [facility] on 1st floor. If you have a lab appointment with the [facility] please come in thru the  Main Entrance and check in at the main information desk  You need to re-schedule your appointment should you arrive 10 or more minutes late.  We strive to give you quality time with our providers, and arriving late affects you and other patients whose appointments are after yours.  Also, if you no show three or more times for appointments you may be dismissed from the clinic at the providers discretion.     Again, thank you for choosing [facility].  Our hope is that these requests will decrease the amount of time that you wait before being seen by our physicians.       _____________________________________________________________  Should you have questions after your visit to [facility], please contact our office at [phone] between the hours of 8:30 a.m. and 4:30 p.m.  Voicemails left after 4:30 p.m. will not be returned until the following business day.  For prescription refill requests, have your pharmacy contact our office.         Resources For Cancer Patients and their Caregivers ? American Cancer Society: Can assist with transportation, wigs, general needs, runs Look Good Feel Better.        602-885-4071 ? Cancer Care: Provides financial assistance, online support  groups, medication/co-pay assistance.  1-800-813-HOPE (629)602-5556) ? [place] Assists [place] cancer patients and their families through emotional , educational and financial support.  352-024-4222 ? [place] DSS Where to apply for food stamps, Medicaid and utility assistance. 575-151-1656 ? RCATS: Transportation to medical appointments. 240-189-0072 ? Social Security Administration: May apply for disability if have a Stage IV cancer. 431-614-4103 620 727 4556 ? [employer], Disability and Transit Services: Assists with nutrition, care and transit needs. 724-151-0616

## 2015-07-28 LAB — PTH, INTACT AND CALCIUM
Calcium, Total (PTH): 8.9 mg/dL (ref 8.7–10.2)
PTH: 94 pg/mL — ABNORMAL HIGH (ref 15–65)

## 2015-07-28 LAB — VITAMIN D 25 HYDROXY (VIT D DEFICIENCY, FRACTURES): VIT D 25 HYDROXY: 52.3 ng/mL (ref 30.0–100.0)

## 2015-07-29 LAB — TACROLIMUS LEVEL: TACROLIMUS (FK506) - LABCORP: 10.2 ng/mL (ref 2.0–20.0)

## 2015-07-30 ENCOUNTER — Other Ambulatory Visit (HOSPITAL_COMMUNITY): Payer: Self-pay | Admitting: Emergency Medicine

## 2015-07-30 ENCOUNTER — Telehealth (HOSPITAL_COMMUNITY): Payer: Self-pay | Admitting: Hematology & Oncology

## 2015-07-30 NOTE — Telephone Encounter (Signed)
pc to [facility] to make sure Jeremy [name] is not required for the 2ndry policy. Per Lyndee Leo it is not required for [id number] testosterone cypionate 100mg  Call ref# [id number]

## 2015-08-01 ENCOUNTER — Ambulatory Visit (INDEPENDENT_AMBULATORY_CARE_PROVIDER_SITE_OTHER): Payer: Self-pay | Admitting: Pharmacist

## 2015-08-01 DIAGNOSIS — Z5181 Encounter for therapeutic drug level monitoring: Secondary | ICD-10-CM

## 2015-08-01 DIAGNOSIS — Z952 Presence of prosthetic heart valve: Secondary | ICD-10-CM

## 2015-08-01 DIAGNOSIS — Z954 Presence of other heart-valve replacement: Secondary | ICD-10-CM

## 2015-08-01 DIAGNOSIS — Z7901 Long term (current) use of anticoagulants: Secondary | ICD-10-CM

## 2015-08-01 LAB — POCT INR: INR: 2.4

## 2015-08-02 ENCOUNTER — Encounter (HOSPITAL_BASED_OUTPATIENT_CLINIC_OR_DEPARTMENT_OTHER): Payer: Medicare Other

## 2015-08-02 DIAGNOSIS — E291 Testicular hypofunction: Secondary | ICD-10-CM | POA: Diagnosis present

## 2015-08-02 MED ORDER — TESTOSTERONE CYPIONATE 200 MG/ML IM SOLN
INTRAMUSCULAR | Status: AC
  Filled 2015-08-02: qty 1

## 2015-08-02 MED ORDER — TESTOSTERONE CYPIONATE 200 MG/ML IM SOLN
100.0000 mg | INTRAMUSCULAR | Status: DC
  Administered 2015-08-02: 100 mg via INTRAMUSCULAR

## 2015-08-02 NOTE — Progress Notes (Signed)
Jeremy [name] presents today for injection per MD orders. Depo-testosterone 100 mg administered IM in right upper outer Gluteal. Administration without incident. Patient tolerated well.

## 2015-08-02 NOTE — Patient Instructions (Signed)
Testosterone injection What is this medicine? TESTOSTERONE (tes TOS ter one) is the main male hormone. It supports normal male development such as muscle growth, facial hair, and deep voice. It is used in males to treat low testosterone levels. This medicine may be used for other purposes; ask your health care provider or pharmacist if you have questions. What should I tell my health care provider before I take this medicine? They need to know if you have any of these conditions: -breast cancer -diabetes -heart disease -kidney disease -liver disease -lung disease -prostate cancer, enlargement -an unusual or allergic reaction to testosterone, other medicines, foods, dyes, or preservatives -pregnant or trying to get pregnant -breast-feeding How should I use this medicine? This medicine is for injection into a muscle. It is usually given by a health care professional in a hospital or clinic setting. Contact your pediatrician regarding the use of this medicine in children. While this medicine may be prescribed for children as young as 12 years of age for selected conditions, precautions do apply. Overdosage: If you think you have taken too much of this medicine contact a poison control center or emergency room at once. NOTE: This medicine is only for you. Do not share this medicine with others. What if I miss a dose? Try not to miss a dose. Your doctor or health care professional will tell you when your next injection is due. Notify the office if you are unable to keep an appointment. What may interact with this medicine? -medicines for diabetes -medicines that treat or prevent blood clots like warfarin -oxyphenbutazone -propranolol -steroid medicines like prednisone or cortisone This list may not describe all possible interactions. Give your health care provider a list of all the medicines, herbs, non-prescription drugs, or dietary supplements you use. Also tell them if you smoke, drink  alcohol, or use illegal drugs. Some items may interact with your medicine. What should I watch for while using this medicine? Visit your doctor or health care professional for regular checks on your progress. They will need to check the level of testosterone in your blood. This medicine is only approved for use in men who have low levels of testosterone related to certain medical conditions. Heart attacks and strokes have been reported with the use of this medicine. Notify your doctor or health care professional and seek emergency treatment if you develop breathing problems; changes in vision; confusion; chest pain or chest tightness; sudden arm pain; severe, sudden headache; trouble speaking or understanding; sudden numbness or weakness of the face, arm or leg; loss of balance or coordination. Talk to your doctor about the risks and benefits of this medicine. This medicine may affect blood sugar levels. If you have diabetes, check with your doctor or health care professional before you change your diet or the dose of your diabetic medicine. This drug is banned from use in athletes by most athletic organizations. What side effects may I notice from receiving this medicine? Side effects that you should report to your doctor or health care professional as soon as possible: -allergic reactions like skin rash, itching or hives, swelling of the face, lips, or tongue -breast enlargement -breathing problems -changes in mood, especially anger, depression, or rage -dark urine -general ill feeling or flu-like symptoms -light-colored stools -loss of appetite, nausea -nausea, vomiting -right upper belly pain -stomach pain -swelling of ankles -too frequent or persistent erections -trouble passing urine or change in the amount of urine -unusually weak or tired -yellowing of the eyes or   skin Additional side effects that can occur in women include: -deep or hoarse voice -facial hair growth -irregular  menstrual periods Side effects that usually do not require medical attention (report to your doctor or health care professional if they continue or are bothersome): -acne -change in sex drive or performance -hair loss -headache This list may not describe all possible side effects. Call your doctor for medical advice about side effects. You may report side effects to FDA at 1-800-FDA-1088. Where should I keep my medicine? Keep out of the reach of children. This medicine can be abused. Keep your medicine in a safe place to protect it from theft. Do not share this medicine with anyone. Selling or giving away this medicine is dangerous and against the law. Store at room temperature between 20 and 25 degrees C (68 and 77 degrees F). Do not freeze. Protect from light. Follow the directions for the product you are prescribed. Throw away any unused medicine after the expiration date. NOTE: This sheet is a summary. It may not cover all possible information. If you have questions about this medicine, talk to your doctor, pharmacist, or health care provider.    2016, Elsevier/Gold Standard. (2013-06-09 08:27:24)  

## 2015-08-07 DIAGNOSIS — Z961 Presence of intraocular lens: Secondary | ICD-10-CM | POA: Diagnosis not present

## 2015-08-07 DIAGNOSIS — H04123 Dry eye syndrome of bilateral lacrimal glands: Secondary | ICD-10-CM | POA: Diagnosis not present

## 2015-08-08 ENCOUNTER — Other Ambulatory Visit (HOSPITAL_COMMUNITY): Payer: Medicare Other

## 2015-08-08 ENCOUNTER — Ambulatory Visit (HOSPITAL_COMMUNITY): Payer: Medicare Other

## 2015-08-09 ENCOUNTER — Ambulatory Visit (INDEPENDENT_AMBULATORY_CARE_PROVIDER_SITE_OTHER): Payer: 59 | Admitting: Pharmacist

## 2015-08-09 DIAGNOSIS — Z952 Presence of prosthetic heart valve: Secondary | ICD-10-CM

## 2015-08-09 DIAGNOSIS — Z954 Presence of other heart-valve replacement: Secondary | ICD-10-CM

## 2015-08-09 DIAGNOSIS — Z7901 Long term (current) use of anticoagulants: Secondary | ICD-10-CM

## 2015-08-09 DIAGNOSIS — Z5181 Encounter for therapeutic drug level monitoring: Secondary | ICD-10-CM

## 2015-08-09 LAB — POCT INR: INR: 2.8

## 2015-08-10 ENCOUNTER — Other Ambulatory Visit (HOSPITAL_COMMUNITY): Payer: Medicare Other

## 2015-08-10 ENCOUNTER — Encounter (HOSPITAL_COMMUNITY): Payer: Self-pay | Admitting: Hematology & Oncology

## 2015-08-10 ENCOUNTER — Encounter (HOSPITAL_COMMUNITY): Payer: Self-pay

## 2015-08-10 ENCOUNTER — Encounter (HOSPITAL_COMMUNITY): Payer: Medicare Other | Attending: Hematology & Oncology

## 2015-08-10 ENCOUNTER — Encounter (HOSPITAL_COMMUNITY): Payer: Medicare Other

## 2015-08-10 ENCOUNTER — Ambulatory Visit (HOSPITAL_COMMUNITY): Payer: Medicare Other

## 2015-08-10 VITALS — BP 181/66 | HR 68 | Temp 98.2°F | Resp 18

## 2015-08-10 DIAGNOSIS — Z940 Kidney transplant status: Secondary | ICD-10-CM | POA: Diagnosis not present

## 2015-08-10 DIAGNOSIS — Z992 Dependence on renal dialysis: Secondary | ICD-10-CM

## 2015-08-10 DIAGNOSIS — Z9889 Other specified postprocedural states: Secondary | ICD-10-CM | POA: Diagnosis not present

## 2015-08-10 DIAGNOSIS — M1990 Unspecified osteoarthritis, unspecified site: Secondary | ICD-10-CM | POA: Insufficient documentation

## 2015-08-10 DIAGNOSIS — D631 Anemia in chronic kidney disease: Secondary | ICD-10-CM

## 2015-08-10 DIAGNOSIS — E785 Hyperlipidemia, unspecified: Secondary | ICD-10-CM | POA: Diagnosis not present

## 2015-08-10 DIAGNOSIS — N189 Chronic kidney disease, unspecified: Principal | ICD-10-CM

## 2015-08-10 DIAGNOSIS — Z7901 Long term (current) use of anticoagulants: Secondary | ICD-10-CM | POA: Diagnosis not present

## 2015-08-10 DIAGNOSIS — I129 Hypertensive chronic kidney disease with stage 1 through stage 4 chronic kidney disease, or unspecified chronic kidney disease: Secondary | ICD-10-CM | POA: Diagnosis not present

## 2015-08-10 DIAGNOSIS — Z79899 Other long term (current) drug therapy: Secondary | ICD-10-CM | POA: Diagnosis not present

## 2015-08-10 DIAGNOSIS — D649 Anemia, unspecified: Secondary | ICD-10-CM | POA: Diagnosis not present

## 2015-08-10 DIAGNOSIS — G4733 Obstructive sleep apnea (adult) (pediatric): Secondary | ICD-10-CM | POA: Insufficient documentation

## 2015-08-10 DIAGNOSIS — N183 Chronic kidney disease, stage 3 unspecified: Secondary | ICD-10-CM

## 2015-08-10 DIAGNOSIS — Z952 Presence of prosthetic heart valve: Secondary | ICD-10-CM | POA: Diagnosis not present

## 2015-08-10 DIAGNOSIS — Z85528 Personal history of other malignant neoplasm of kidney: Secondary | ICD-10-CM | POA: Insufficient documentation

## 2015-08-10 DIAGNOSIS — N186 End stage renal disease: Secondary | ICD-10-CM

## 2015-08-10 LAB — CBC
HCT: 35.1 % — ABNORMAL LOW (ref 39.0–52.0)
Hemoglobin: 10.6 g/dL — ABNORMAL LOW (ref 13.0–17.0)
MCH: 28 pg (ref 26.0–34.0)
MCHC: 30.2 g/dL (ref 30.0–36.0)
MCV: 92.6 fL (ref 78.0–100.0)
PLATELETS: 105 10*3/uL — AB (ref 150–400)
RBC: 3.79 MIL/uL — AB (ref 4.22–5.81)
RDW: 15.1 % (ref 11.5–15.5)
WBC: 3.2 10*3/uL — ABNORMAL LOW (ref 4.0–10.5)

## 2015-08-10 MED ORDER — DARBEPOETIN ALFA 150 MCG/0.3ML IJ SOSY
PREFILLED_SYRINGE | INTRAMUSCULAR | Status: AC
  Filled 2015-08-10: qty 0.3

## 2015-08-10 MED ORDER — DARBEPOETIN ALFA 150 MCG/0.3ML IJ SOSY
150.0000 ug | PREFILLED_SYRINGE | Freq: Once | INTRAMUSCULAR | Status: AC
  Administered 2015-08-10: 150 ug via SUBCUTANEOUS

## 2015-08-10 NOTE — Patient Instructions (Signed)
[place] at [facility] Discharge Instructions  RECOMMENDATIONS MADE BY THE CONSULTANT AND ANY TEST RESULTS WILL BE SENT TO YOUR REFERRING PHYSICIAN.  Aranesp injection today (hemoglobin 10.6). Return as scheduled for lab work and injections. Return as scheduled for office visit.   Thank you for choosing [place] at [facility] to provide your oncology and hematology care.  To afford each patient quality time with our provider, please arrive at least 15 minutes before your scheduled appointment time.   Beginning January 23rd 2017 lab work for the [facility] will be done in the  Main lab at [facility] on 1st floor. If you have a lab appointment with the [facility] please come in thru the  Main Entrance and check in at the main information desk  You need to re-schedule your appointment should you arrive 10 or more minutes late.  We strive to give you quality time with our providers, and arriving late affects you and other patients whose appointments are after yours.  Also, if you no show three or more times for appointments you may be dismissed from the clinic at the providers discretion.     Again, thank you for choosing [facility].  Our hope is that these requests will decrease the amount of time that you wait before being seen by our physicians.       _____________________________________________________________  Should you have questions after your visit to [facility], please contact our office at [phone] between the hours of 8:30 a.m. and 4:30 p.m.  Voicemails left after 4:30 p.m. will not be returned until the following business day.  For prescription refill requests, have your pharmacy contact our office.         Resources For Cancer Patients and their Caregivers ? American Cancer Society: Can assist with transportation, wigs, general needs, runs Look Good Feel Better.         731-874-8765 ? Cancer Care: Provides financial assistance, online support groups, medication/co-pay assistance.  1-800-813-HOPE 734-302-4531) ? [employer] Assists [place] cancer patients and their families through emotional , educational and financial support.  843-757-6456 ? [place] DSS Where to apply for food stamps, Medicaid and utility assistance. 802-874-7067 ? [employer]: Transportation to medical appointments. 870-634-0708 ? Social Security Administration: May apply for disability if have a Stage IV cancer. 386-483-2106 737 229 1823 ? [employer], Disability and Transit Services: Assists with nutrition, care and transit needs. [phone]

## 2015-08-10 NOTE — Progress Notes (Signed)
1105:  Jeremy [name] presents today for injection per the provider's orders.  Aranesp administration without incident; see MAR for injection details.  Patient tolerated procedure well and without incident.  No questions or complaints noted at this time.

## 2015-08-16 ENCOUNTER — Encounter (HOSPITAL_BASED_OUTPATIENT_CLINIC_OR_DEPARTMENT_OTHER): Payer: Medicare Other

## 2015-08-16 VITALS — BP 210/72 | HR 78 | Temp 98.0°F | Resp 18

## 2015-08-16 DIAGNOSIS — E291 Testicular hypofunction: Secondary | ICD-10-CM | POA: Diagnosis present

## 2015-08-16 MED ORDER — TESTOSTERONE CYPIONATE 200 MG/ML IM SOLN
100.0000 mg | Freq: Once | INTRAMUSCULAR | Status: AC
  Administered 2015-08-16: 100 mg via INTRAMUSCULAR

## 2015-08-16 NOTE — Progress Notes (Signed)
Patient reports that he forgot to take his blood pressure medication this morning.  His blood pressure is elevated, but he is completely asymptomatic.  He states that he will take his BP meds as soon as he gets home.    Jeremy [name] presents today for injection per MD orders. Testosterone administered IM in left Gluteal. Administration without incident. Patient tolerated well.

## 2015-08-16 NOTE — Patient Instructions (Signed)
[place] at [facility] Discharge Instructions  RECOMMENDATIONS MADE BY THE CONSULTANT AND ANY TEST RESULTS WILL BE SENT TO YOUR REFERRING PHYSICIAN.  IM testosterone today.    Thank you for choosing [place] at [facility] to provide your oncology and hematology care.  To afford each patient quality time with our provider, please arrive at least 15 minutes before your scheduled appointment time.   Beginning January 23rd 2017 lab work for the [facility] will be done in the  Main lab at [facility] on 1st floor. If you have a lab appointment with the [facility] please come in thru the  Main Entrance and check in at the main information desk  You need to re-schedule your appointment should you arrive 10 or more minutes late.  We strive to give you quality time with our providers, and arriving late affects you and other patients whose appointments are after yours.  Also, if you no show three or more times for appointments you may be dismissed from the clinic at the providers discretion.     Again, thank you for choosing [facility]  Our hope is that these requests will decrease the amount of time that you wait before being seen by our physicians.       _____________________________________________________________  Should you have questions after your visit to [facility], please contact our office at [phone] between the hours of 8:30 a.m. and 4:30 p.m.  Voicemails left after 4:30 p.m. will not be returned until the following business day.  For prescription refill requests, have your pharmacy contact our office.         Resources For Cancer Patients and their Caregivers ? American Cancer Society: Can assist with transportation, wigs, general needs, runs Look Good Feel Better.        248-214-6048 ? Cancer Care: Provides financial assistance, online support groups, medication/co-pay assistance.   1-800-813-HOPE 534-077-3192) ? [facility] Assists [place] cancer patients and their families through emotional , educational and financial support.  534-111-4303 ? [place] DSS Where to apply for food stamps, Medicaid and utility assistance. 478-484-8919 ? [employer]: Transportation to medical appointments. 520-260-3272 ? Social Security Administration: May apply for disability if have a Stage IV cancer. 217-364-6115 623-356-2426 ? [employer], Disability and Transit Services: Assists with nutrition, care and transit needs. [place] Support Programs: @10RELATIVEDAYS @ > Cancer Support Group  2nd Tuesday of the month 1pm-2pm, Journey Room  > Creative Journey  3rd Tuesday of the month 1130am-1pm, Journey Room  > Look Good Feel Better  1st Wednesday of the month 10am-12 noon, Journey Room (Call [place] to register [phone])

## 2015-08-17 ENCOUNTER — Ambulatory Visit (INDEPENDENT_AMBULATORY_CARE_PROVIDER_SITE_OTHER): Payer: Self-pay | Admitting: Pharmacist

## 2015-08-17 DIAGNOSIS — Z952 Presence of prosthetic heart valve: Secondary | ICD-10-CM

## 2015-08-17 DIAGNOSIS — Z7901 Long term (current) use of anticoagulants: Secondary | ICD-10-CM | POA: Diagnosis not present

## 2015-08-17 DIAGNOSIS — Z954 Presence of other heart-valve replacement: Secondary | ICD-10-CM | POA: Diagnosis not present

## 2015-08-17 DIAGNOSIS — Z5181 Encounter for therapeutic drug level monitoring: Secondary | ICD-10-CM

## 2015-08-17 LAB — POCT INR: INR: 2.7

## 2015-08-22 ENCOUNTER — Ambulatory Visit (HOSPITAL_COMMUNITY): Payer: Medicare Other | Admitting: Hematology & Oncology

## 2015-08-22 ENCOUNTER — Ambulatory Visit (HOSPITAL_COMMUNITY): Payer: Medicare Other

## 2015-08-22 ENCOUNTER — Other Ambulatory Visit (HOSPITAL_COMMUNITY): Payer: Medicare Other

## 2015-08-24 ENCOUNTER — Encounter (HOSPITAL_COMMUNITY): Payer: Medicare Other

## 2015-08-24 ENCOUNTER — Ambulatory Visit (INDEPENDENT_AMBULATORY_CARE_PROVIDER_SITE_OTHER): Payer: Self-pay | Admitting: Pharmacist

## 2015-08-24 ENCOUNTER — Encounter (HOSPITAL_BASED_OUTPATIENT_CLINIC_OR_DEPARTMENT_OTHER): Payer: Medicare Other | Admitting: Hematology & Oncology

## 2015-08-24 ENCOUNTER — Encounter (HOSPITAL_BASED_OUTPATIENT_CLINIC_OR_DEPARTMENT_OTHER): Payer: Medicare Other

## 2015-08-24 VITALS — BP 136/53 | HR 59 | Temp 98.2°F | Resp 18 | Wt 188.9 lb

## 2015-08-24 DIAGNOSIS — N186 End stage renal disease: Secondary | ICD-10-CM

## 2015-08-24 DIAGNOSIS — D631 Anemia in chronic kidney disease: Secondary | ICD-10-CM

## 2015-08-24 DIAGNOSIS — D649 Anemia, unspecified: Secondary | ICD-10-CM | POA: Diagnosis not present

## 2015-08-24 DIAGNOSIS — E291 Testicular hypofunction: Secondary | ICD-10-CM

## 2015-08-24 DIAGNOSIS — I129 Hypertensive chronic kidney disease with stage 1 through stage 4 chronic kidney disease, or unspecified chronic kidney disease: Secondary | ICD-10-CM | POA: Diagnosis not present

## 2015-08-24 DIAGNOSIS — C649 Malignant neoplasm of unspecified kidney, except renal pelvis: Secondary | ICD-10-CM

## 2015-08-24 DIAGNOSIS — Z5181 Encounter for therapeutic drug level monitoring: Secondary | ICD-10-CM

## 2015-08-24 DIAGNOSIS — N183 Chronic kidney disease, stage 3 unspecified: Secondary | ICD-10-CM

## 2015-08-24 DIAGNOSIS — Z7901 Long term (current) use of anticoagulants: Secondary | ICD-10-CM

## 2015-08-24 DIAGNOSIS — Z952 Presence of prosthetic heart valve: Secondary | ICD-10-CM

## 2015-08-24 DIAGNOSIS — N189 Chronic kidney disease, unspecified: Principal | ICD-10-CM

## 2015-08-24 DIAGNOSIS — Z85528 Personal history of other malignant neoplasm of kidney: Secondary | ICD-10-CM | POA: Diagnosis not present

## 2015-08-24 DIAGNOSIS — Z940 Kidney transplant status: Secondary | ICD-10-CM | POA: Diagnosis not present

## 2015-08-24 DIAGNOSIS — E785 Hyperlipidemia, unspecified: Secondary | ICD-10-CM | POA: Diagnosis not present

## 2015-08-24 DIAGNOSIS — Z954 Presence of other heart-valve replacement: Secondary | ICD-10-CM

## 2015-08-24 LAB — CBC WITH DIFFERENTIAL/PLATELET
BASOS ABS: 0 10*3/uL (ref 0.0–0.1)
BASOS PCT: 0 %
EOS PCT: 7 %
Eosinophils Absolute: 0.2 10*3/uL (ref 0.0–0.7)
HCT: 35.6 % — ABNORMAL LOW (ref 39.0–52.0)
Hemoglobin: 10.5 g/dL — ABNORMAL LOW (ref 13.0–17.0)
LYMPHS PCT: 27 %
Lymphs Abs: 0.8 10*3/uL (ref 0.7–4.0)
MCH: 27.3 pg (ref 26.0–34.0)
MCHC: 29.5 g/dL — ABNORMAL LOW (ref 30.0–36.0)
MCV: 92.7 fL (ref 78.0–100.0)
MONO ABS: 0.3 10*3/uL (ref 0.1–1.0)
Monocytes Relative: 8 %
NEUTROS ABS: 1.8 10*3/uL (ref 1.7–7.7)
Neutrophils Relative %: 58 %
PLATELETS: 109 10*3/uL — AB (ref 150–400)
RBC: 3.84 MIL/uL — AB (ref 4.22–5.81)
RDW: 15.2 % (ref 11.5–15.5)
WBC: 3 10*3/uL — AB (ref 4.0–10.5)

## 2015-08-24 LAB — URINE MICROSCOPIC-ADD ON
Bacteria, UA: NONE SEEN
Squamous Epithelial / LPF: NONE SEEN
WBC, UA: NONE SEEN WBC/hpf (ref 0–5)

## 2015-08-24 LAB — COMPREHENSIVE METABOLIC PANEL
ALT: 20 U/L (ref 17–63)
ANION GAP: 5 (ref 5–15)
AST: 27 U/L (ref 15–41)
Albumin: 3.5 g/dL (ref 3.5–5.0)
Alkaline Phosphatase: 122 U/L (ref 38–126)
BUN: 71 mg/dL — ABNORMAL HIGH (ref 6–20)
CALCIUM: 9.2 mg/dL (ref 8.9–10.3)
CO2: 16 mmol/L — AB (ref 22–32)
Chloride: 116 mmol/L — ABNORMAL HIGH (ref 101–111)
Creatinine, Ser: 2.51 mg/dL — ABNORMAL HIGH (ref 0.61–1.24)
GFR calc Af Amer: 31 mL/min — ABNORMAL LOW (ref >60)
GFR calc non Af Amer: 27 mL/min — ABNORMAL LOW (ref >60)
GLUCOSE: 108 mg/dL — AB (ref 65–99)
Potassium: 5.3 mmol/L — ABNORMAL HIGH (ref 3.5–5.1)
Sodium: 137 mmol/L (ref 135–145)
Total Bilirubin: 0.5 mg/dL (ref 0.3–1.2)
Total Protein: 5.8 g/dL — ABNORMAL LOW (ref 6.5–8.1)

## 2015-08-24 LAB — URINALYSIS, ROUTINE W REFLEX MICROSCOPIC
Bilirubin Urine: NEGATIVE
Glucose, UA: NEGATIVE mg/dL
Ketones, ur: NEGATIVE mg/dL
Leukocytes, UA: NEGATIVE
NITRITE: NEGATIVE
SPECIFIC GRAVITY, URINE: 1.02 (ref 1.005–1.030)
pH: 5.5 (ref 5.0–8.0)

## 2015-08-24 LAB — PROTEIN / CREATININE RATIO, URINE
Creatinine, Urine: 100.93 mg/dL
PROTEIN CREATININE RATIO: 1.23 mg/mg{creat} — AB (ref 0.00–0.15)
Total Protein, Urine: 124 mg/dL

## 2015-08-24 LAB — FERRITIN: FERRITIN: 353 ng/mL — AB (ref 24–336)

## 2015-08-24 LAB — IRON AND TIBC
Iron: 84 ug/dL (ref 45–182)
Saturation Ratios: 34 % (ref 17.9–39.5)
TIBC: 248 ug/dL — AB (ref 250–450)
UIBC: 164 ug/dL

## 2015-08-24 LAB — MAGNESIUM: Magnesium: 1.9 mg/dL (ref 1.7–2.4)

## 2015-08-24 LAB — URIC ACID: URIC ACID, SERUM: 7.3 mg/dL (ref 4.4–7.6)

## 2015-08-24 LAB — POCT INR: INR: 2

## 2015-08-24 LAB — PHOSPHORUS: PHOSPHORUS: 4.4 mg/dL (ref 2.5–4.6)

## 2015-08-24 MED ORDER — DARBEPOETIN ALFA 150 MCG/0.3ML IJ SOSY
PREFILLED_SYRINGE | INTRAMUSCULAR | Status: AC
  Filled 2015-08-24: qty 0.3

## 2015-08-24 MED ORDER — DARBEPOETIN ALFA 150 MCG/0.3ML IJ SOSY
150.0000 ug | PREFILLED_SYRINGE | Freq: Once | INTRAMUSCULAR | Status: AC
  Administered 2015-08-24: 150 ug via SUBCUTANEOUS

## 2015-08-24 NOTE — Progress Notes (Signed)
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 Moderate arterionephrosclerosis with focal features of accelerated hypertension related injury, moderate diabetic nephropathy, recurrent IgA nephropathy   HISTORY OF PRESENTING ILLNESS:  Jeremy [name] 60 y.o. male is here for follow-up of anemia secondary to CKD.  [name] returns to the [place] today unaccompanied.  He says that, in general, he feels good until he takes his blood pressure medicine, noting "it might be a little strong." He isn't sure if it needs to be reduced or something like that. He notes that when his blood pressure is low, he does feel shortness of breath.  Mr. Fann confirms that his nephrologist and transplant doctor work to coordinate his blood pressure medications. He goes back to see his nephrologist on the 14th of June.  He notes that he can tell a difference in how he feels on the low dose of testosterone and with higher blood counts.  During the physical exam, when asked about the fluid around his eyes, he says "I know, I hate it." He says "it's about average today," noting that sometimes his eyes are swelled almost closed. He confirms that he's urinating okay, but wonders if his weight dropped this time. He notes cutting back on salts especially to try to control this.  When asked if he has belly pain, he notes that "every once in a while, I get a sharp pain or something," but he says he doesn't think this  is anything to worry about.  He denies CP.    MEDICAL HISTORY:  Past Medical History  Diagnosis Date  . Kidney disease     Chronic kidney disease secondary to IgA nephropathy diagnosed when he was 88. 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  Marland Kitchen Hx of colonoscopy     2009 by Dr. Laural Golden. Normal except for small submucosal lipoma. No evidence of polyps or colitis.  . Hyperlipidemia   . Hypertension   . Arthritis   . Chronic anticoagulation 06/2012  . Dialysis patient [facility]) 01 01 2008  . Blood transfusion without reported diagnosis   . Cancer ([place])   . OSA (obstructive sleep apnea)   . Anemia     SURGICAL HISTORY: Past Surgical History  Procedure Laterality Date  . Dg av dialysis  shunt access exist*l* or      left arm  . Back surgery    . Coronary angioplasty with stent placement    . Kidney transplant    . Aortic valve replacement  3 /11 /2014    At [facility]    SOCIAL HISTORY: Social History   Social History  . Marital Status: Divorced    Spouse Name: N/A  . Number of Children: N/A  . Years of Education: N/A   Occupational History  . courier     [employer]   Social History Main Topics  . Smoking status: Never Smoker   . Smokeless tobacco: Not on file  . Alcohol Use: No  .  Drug Use: No  . Sexual Activity: Not on file   Other Topics Concern  . Not on file   Social History Narrative   Single   1 daughter 9    Divorced 67 y/o daughter; son killed in a car accident when he was 22, a while back 2 grandsons, both aged 56 Don't smoke, don't drink; "cuss a little bit, but other than that ..." Used to work for [employer], but transplant has cost him everything No energy, dialysis  No hobbies right now; no energy to do anything Used to like doing woodworking; furniture, cabinets, keeping busy Likes fixing and flipping cars  FAMILY HISTORY: No  family history on file. indicated that his mother is deceased. He indicated that his father is alive. He reported the following about his sister: health unknown. He reported the following about his brother: unknown.   Father still living; 19-80 Not healthy, has a touch of dementia and heart issues  Sister died of kidney issues when she was very young Other than that, no one with kidney problems  Mother died with cancer; maybe pancreatic, unsure of type; aged 37-53  Brother in perfect health, aged 59  ALLERGIES:  is allergic to atorvastatin and lipitor.  MEDICATIONS:  Current Outpatient Prescriptions  Medication Sig Dispense Refill  . acetaminophen (TYLENOL) 325 MG tablet Take 650 mg by mouth every 6 (six) hours as needed.    . Ascorbic Acid (VITAMIN C) 100 MG tablet Take 100 mg by mouth daily.    . B Complex Vitamins (VITAMIN-B COMPLEX) TABS Take 1 tablet by mouth.    . calcitRIOL (ROCALTROL) 0.25 MCG capsule Take 0.25 mcg by mouth. Monday - Wednesday-friday    . CALCIUM-MAGNESIUM-ZINC PO Take 1 tablet by mouth daily.    . cholecalciferol (VITAMIN D) 1000 UNITS tablet Take 1,000 Units by mouth daily.    Marland Kitchen Dextrose-Fructose-Sod Citrate [id number] MG CHEW Chew 1 tablet by mouth.    . dicyclomine (BENTYL) 10 MG capsule TAKE 1 CAPSULE BY MOUTH TWICE DAILY. 60 capsule 5  . doxazosin (CARDURA) 1 MG tablet Take 1 mg by mouth daily. Reported on 06/12/2015  3  . febuxostat (ULORIC) 40 MG tablet Take 40 mg by mouth daily.    . folic acid-vitamin b complex-vitamin c-selenium-zinc (DIALYVITE) 3 MG TABS Take 1 tablet by mouth daily.      . furosemide (LASIX) 80 MG tablet Take 80 mg by mouth as needed.    . labetalol (NORMODYNE) 300 MG tablet Take 600 mg by mouth 3 (three) times daily.    Marland Kitchen loratadine (CLARITIN) 10 MG tablet Take 10 mg by mouth. Reported on 06/12/2015    . magnesium oxide (MAG-OX) 400 (241.3 MG) MG tablet Take 400 mg by mouth 2 (two) times daily.  1  . Multiple Vitamin  (MULTI-VITAMINS) TABS Take by mouth.    . mycophenolate (MYFORTIC) 180 MG EC tablet Take 180 mg by mouth. Take 8 tablets daily    . NIFEdipine (PROCARDIA-XL/ADALAT CC) 60 MG 24 hr tablet Take 60 mg by mouth 2 (two) times daily.     . nitroGLYCERIN (NITROSTAT) 0.4 MG SL tablet Place 1 tablet (0.4 mg total) under the tongue every 5 (five) minutes as needed for chest pain. (Patient not taking: Reported on 06/12/2015) 25 tablet 2  . Omega-3 1000 MG CAPS Take 3 g by mouth.    Marland Kitchen omeprazole (PRILOSEC) 20 MG capsule Take 20 mg by mouth daily.    . pravastatin (PRAVACHOL) 40 MG tablet     .  predniSONE (DELTASONE) 5 MG tablet Take 5 mg by mouth daily.    Marland Kitchen sulfamethoxazole-trimethoprim (BACTRIM,SEPTRA) 400-80 MG per tablet Take 1 tablet by mouth 3 (three) times a week.    . tacrolimus (PROGRAF) 1 MG capsule Take 2 capsules by mouth 2 (two) times daily.    Marland Kitchen warfarin (COUMADIN) 1 MG tablet TAKE 1-3 TABLETS BY MOUTH ONCE DAILY AS DIRECTED BY ANTICOAGULATION CLINIC. 100 tablet 2   No current facility-administered medications for this visit.    Review of Systems  Constitutional: Positive for malaise/fatigue.  HENT: Negative.   Eyes: Negative.   Respiratory: Negative.   Cardiovascular: Negative.   Gastrointestinal: Negative.   Genitourinary: Negative.   Musculoskeletal: Negative.   Skin: Negative.   Neurological: Positive for weakness.  Endo/Heme/Allergies: Negative.   Psychiatric/Behavioral: Negative.   All other systems reviewed and are negative. 14 point ROS was done and is otherwise as detailed above or in HPI    PHYSICAL EXAMINATION: ECOG PERFORMANCE STATUS: 1 - Symptomatic but completely ambulatory  Filed Vitals:   08/24/15 1259  BP: 136/53  Pulse: 59  Temp: 98.2 F (36.8 C)  Resp: 18   Filed Weights   08/24/15 1259  Weight: 188 lb 14.4 oz (85.684 kg)    Physical Exam  Constitutional: He is oriented to person, place, and time and well-developed, well-nourished, and in no  distress.  HENT:  Head: Normocephalic and atraumatic.  Nose: Nose normal.  Mouth/Throat: Oropharynx is clear and moist. No oropharyngeal exudate.  Eyes: Conjunctivae and EOM are normal. Pupils are equal, round, and reactive to light. Right eye exhibits no discharge. Left eye exhibits no discharge. No scleral icterus. periorbital edema Neck: Normal range of motion. Neck supple. No tracheal deviation present. No thyromegaly present.  Cardiovascular: Normal rate, regular rhythm, normal heart sounds and intact distal pulses.  Exam reveals no gallop and no friction rub. Right leg chronically bigger than left.   No murmur heard. Has a mechanical heart valve.  Pulmonary/Chest: Effort normal and breath sounds normal. He has no wheezes. He has no rales.  Abdominal: Soft. Bowel sounds are normal. He exhibits no distension and no mass. There is no tenderness. There is no rebound and no guarding.  Musculoskeletal: Normal range of motion. He exhibits no edema.  Lymphadenopathy:    He has no cervical adenopathy.  Neurological: He is alert and oriented to person, place, and time. He has normal reflexes. No cranial nerve deficit. Gait normal. Coordination normal.  Skin: Skin is warm and dry. No rash noted.  Psychiatric: Mood, memory, affect and judgment normal.  Nursing note and vitals reviewed.   LABORATORY DATA:  I have reviewed the data as listed  Results for MAKYLE, ESLICK (MRN [id number]) as of 08/25/2015 19:55  Ref. Range 06/12/2015 16:00 06/25/2015 07:20 07/11/2015 12:17 07/27/2015 11:28 08/10/2015 10:22 08/24/2015 11:18  Hemoglobin Latest Ref Range: 13.0-17.0 g/dL 9.9 (L) 9.5 (L) 9.7 (L) 10.0 (L) 10.6 (L) 10.5 (L)    Results for KREE, RAFTER (MRN [id number]) as of 08/24/2015 13:07  Ref. Range 08/24/2015 11:27  Total Protein, Urine Latest Units: mg/dL 124  Protein Creatinine Ratio Latest Ref Range: 0.00-0.15 mg/mgCre 1.23 (H)  Creatinine, Urine Latest Units: mg/dL 100.93    Results for OZAN, MACLAY (MRN  [id number]) as of 08/24/2015 13:07  Ref. Range 08/24/2015 11:26  Appearance Latest Ref Range: CLEAR  CLEAR  Bacteria, UA Latest Ref Range: NONE SEEN  NONE SEEN  Bilirubin Urine Latest Ref Range: NEGATIVE  NEGATIVE  Color,  Urine Latest Ref Range: YELLOW  YELLOW  Glucose Latest Ref Range: NEGATIVE mg/dL NEGATIVE  Hgb urine dipstick Latest Ref Range: NEGATIVE  MODERATE (A)  Ketones, ur Latest Ref Range: NEGATIVE mg/dL NEGATIVE  Leukocytes, UA Latest Ref Range: NEGATIVE  NEGATIVE  Nitrite Latest Ref Range: NEGATIVE  NEGATIVE  pH Latest Ref Range: 5.0-8.0  5.5  Protein Latest Ref Range: NEGATIVE mg/dL >300 (A)  RBC / HPF Latest Ref Range: 0-5 RBC/hpf 6-30  Specific Gravity, Urine Latest Ref Range: 1.005-1.030  1.020  Squamous Epithelial / LPF Latest Ref Range: NONE SEEN  NONE SEEN  WBC, UA Latest Ref Range: 0-5 WBC/hpf NONE SEEN    Results for EVYN, PUTZIER (MRN [id number]) as of 08/24/2015 13:07  Ref. Range 08/24/2015 11:18  Sodium Latest Ref Range: 135-145 mmol/L 137  Potassium Latest Ref Range: 3.5-5.1 mmol/L 5.3 (H)  Chloride Latest Ref Range: 101-111 mmol/L 116 (H)  CO2 Latest Ref Range: 22-32 mmol/L 16 (L)  BUN Latest Ref Range: 6-20 mg/dL 71 (H)  Creatinine Latest Ref Range: 0.61-1.24 mg/dL 2.51 (H)  Calcium Latest Ref Range: 8.9-10.3 mg/dL 9.2  EGFR (Non-African Amer.) Latest Ref Range: >60 mL/min 27 (L)  EGFR (African American) Latest Ref Range: >60 mL/min 31 (L)  Glucose Latest Ref Range: 65-99 mg/dL 108 (H)  Anion gap Latest Ref Range: 5-15  5  Phosphorus Latest Ref Range: 2.5-4.6 mg/dL 4.4  Magnesium Latest Ref Range: 1.7-2.4 mg/dL 1.9  Alkaline Phosphatase Latest Ref Range: 38-126 U/L 122  Albumin Latest Ref Range: 3.5-5.0 g/dL 3.5  Uric Acid, Serum Latest Ref Range: 4.4-7.6 mg/dL 7.3  AST Latest Ref Range: 15-41 U/L 27  ALT Latest Ref Range: 17-63 U/L 20  Total Protein Latest Ref Range: 6.5-8.1 g/dL 5.8 (L)  Total Bilirubin Latest Ref Range: 0.3-1.2 mg/dL 0.5  WBC Latest  Ref Range: 4.0-10.5 K/uL 3.0 (L)  RBC Latest Ref Range: 4.22-5.81 MIL/uL 3.84 (L)  Hemoglobin Latest Ref Range: 13.0-17.0 g/dL 10.5 (L)  HCT Latest Ref Range: 39.0-52.0 % 35.6 (L)  MCV Latest Ref Range: 78.0-100.0 fL 92.7  MCH Latest Ref Range: 26.0-34.0 pg 27.3  MCHC Latest Ref Range: 30.0-36.0 g/dL 29.5 (L)  RDW Latest Ref Range: 11.5-15.5 % 15.2  Platelets Latest Ref Range: 150-400 K/uL 109 (L)  Neutrophils Latest Units: % 58  Lymphocytes Latest Units: % 27  Monocytes Relative Latest Units: % 8  Eosinophil Latest Units: % 7  Basophil Latest Units: % 0  NEUT# Latest Ref Range: 1.7-7.7 K/uL 1.8  Lymphocyte # Latest Ref Range: 0.7-4.0 K/uL 0.8  Monocyte # Latest Ref Range: 0.1-1.0 K/uL 0.3  Eosinophils Absolute Latest Ref Range: 0.0-0.7 K/uL 0.2  Basophils Absolute Latest Ref Range: 0.0-0.1 K/uL 0.0            ASSESSMENT & PLAN:  Persistent Anemia S/P Renal transplant CKD Aranesp growth factor support q 2 weeks Chronic anticoagulation secondary to mechanical St. Jude AVR Chronic Immunosuppression BMBX WNL Hypogonadism  60 year old with complicated medical history.  His anemia is certainly multifactorial.  We reviewed his entire workup in detail.  We discussed potential low dose testosterone replacement as a way to improve erythropoiesis. I presented risks/benefits of this. His major concern is his QOL and severe fatigue.  I have advised him that his fatigue may be from many issues not just his anemia. Symptoms from low testosterone are somewhat subjective and I would recommend replacement starting slowly and only to improve hematopoiesis and edge his hormone levels into the normal range.  Counts have improved.  I have arranged for free and total testosterone at his next lab draw.  He is to continue to follow with his nephrologist and cardiologist.   All questions were answered. The patient knows to call the clinic with any problems, questions or concerns.  This document  serves as a record of services personally performed by Ancil Linsey, MD. It was created on her behalf by Toni Amend, a trained medical scribe. The creation of this record is based on the scribe's personal observations and the provider's statements to them. This document has been checked and approved by the attending provider.  I have reviewed the above documentation for accuracy and completeness and I agree with the above.  This note was electronically signed.  Molli Hazard, MD  08/24/2015 1:28 PM

## 2015-08-24 NOTE — Progress Notes (Signed)
Jeremy [name] presents today for injection per MD orders. Aranesp 150mcg administered SQ in left Abdomen. Administration without incident. Patient tolerated well.  

## 2015-08-25 ENCOUNTER — Encounter (HOSPITAL_COMMUNITY): Payer: Self-pay | Admitting: Hematology & Oncology

## 2015-08-25 LAB — VITAMIN D 25 HYDROXY (VIT D DEFICIENCY, FRACTURES): Vit D, 25-Hydroxy: 48.8 ng/mL (ref 30.0–100.0)

## 2015-08-25 LAB — PTH, INTACT AND CALCIUM
Calcium, Total (PTH): 9.2 mg/dL (ref 8.7–10.2)
PTH: 63 pg/mL (ref 15–65)

## 2015-08-27 LAB — TACROLIMUS LEVEL: Tacrolimus (FK506) - LabCorp: 4.1 ng/mL (ref 2.0–20.0)

## 2015-08-30 ENCOUNTER — Encounter (HOSPITAL_BASED_OUTPATIENT_CLINIC_OR_DEPARTMENT_OTHER): Payer: Medicare Other

## 2015-08-30 ENCOUNTER — Ambulatory Visit (HOSPITAL_COMMUNITY): Payer: Medicare Other

## 2015-08-30 ENCOUNTER — Encounter (HOSPITAL_COMMUNITY): Payer: Self-pay

## 2015-08-30 DIAGNOSIS — E291 Testicular hypofunction: Secondary | ICD-10-CM

## 2015-08-30 MED ORDER — TESTOSTERONE CYPIONATE 200 MG/ML IM SOLN
INTRAMUSCULAR | Status: AC
  Filled 2015-08-30: qty 1

## 2015-08-30 MED ORDER — TESTOSTERONE CYPIONATE 200 MG/ML IM SOLN
100.0000 mg | Freq: Once | INTRAMUSCULAR | Status: AC
  Administered 2015-08-30: 100 mg via INTRAMUSCULAR

## 2015-08-30 NOTE — Patient Instructions (Signed)
[place] at [facility] Discharge Instructions  RECOMMENDATIONS MADE BY THE CONSULTANT AND ANY TEST RESULTS WILL BE SENT TO YOUR REFERRING PHYSICIAN.  Testosterone injection today. Return as scheduled for Aranesp and testosterone injections.  Call the clinic should you have any questions or concerns.   Thank you for choosing [place] at [facility] to provide your oncology and hematology care.  To afford each patient quality time with our provider, please arrive at least 15 minutes before your scheduled appointment time.   Beginning January 23rd 2017 lab work for the [facility] will be done in the  Main lab at [facility] on 1st floor. If you have a lab appointment with the [facility] please come in thru the  Main Entrance and check in at the main information desk  You need to re-schedule your appointment should you arrive 10 or more minutes late.  We strive to give you quality time with our providers, and arriving late affects you and other patients whose appointments are after yours.  Also, if you no show three or more times for appointments you may be dismissed from the clinic at the providers discretion.     Again, thank you for choosing [facility].  Our hope is that these requests will decrease the amount of time that you wait before being seen by our physicians.       _____________________________________________________________  Should you have questions after your visit to [facility], please contact our office at [phone] between the hours of 8:30 a.m. and 4:30 p.m.  Voicemails left after 4:30 p.m. will not be returned until the following business day.  For prescription refill requests, have your pharmacy contact our office.         Resources For Cancer Patients and their Caregivers ? American Cancer Society: Can assist with transportation, wigs, general needs, runs Look Good Feel Better.         ([phone] ? Cancer Care: Provides financial assistance, online support groups, medication/co-pay assistance.  1-800-813-HOPE (272)760-3627) ? [facility] Assists [place] cancer patients and their families through emotional , educational and financial support.  403-087-1648 ? [place] DSS Where to apply for food stamps, Medicaid and utility assistance. 917-765-4058 ? RCATS: Transportation to medical appointments. 470-790-7119 ? Social Security Administration: May apply for disability if have a Stage IV cancer. 930-252-7087 313-614-4594 ? [employer], Disability and Transit Services: Assists with nutrition, care and transit needs. [place] Support Programs: @10RELATIVEDAYS @ > Cancer Support Group  2nd Tuesday of the month 1pm-2pm, Journey Room  > Creative Journey  3rd Tuesday of the month 1130am-1pm, Journey Room  > Look Good Feel Better  1st Wednesday of the month 10am-12 noon, Journey Room (Call [place] to register [phone])

## 2015-08-30 NOTE — Progress Notes (Signed)
Jeremy [name] presents today for injection per the provider's orders.  Testosterone administration without incident; see MAR for injection details.  Patient tolerated procedure well and without incident.  No questions or complaints noted at this time.

## 2015-08-31 ENCOUNTER — Ambulatory Visit (INDEPENDENT_AMBULATORY_CARE_PROVIDER_SITE_OTHER): Payer: Self-pay | Admitting: Pharmacist

## 2015-08-31 DIAGNOSIS — Z5181 Encounter for therapeutic drug level monitoring: Secondary | ICD-10-CM

## 2015-08-31 DIAGNOSIS — Z954 Presence of other heart-valve replacement: Secondary | ICD-10-CM

## 2015-08-31 DIAGNOSIS — Z952 Presence of prosthetic heart valve: Secondary | ICD-10-CM

## 2015-08-31 DIAGNOSIS — Z7901 Long term (current) use of anticoagulants: Secondary | ICD-10-CM

## 2015-08-31 LAB — POCT INR: INR: 3

## 2015-09-04 ENCOUNTER — Ambulatory Visit: Payer: Self-pay | Admitting: Pharmacist

## 2015-09-05 ENCOUNTER — Ambulatory Visit (INDEPENDENT_AMBULATORY_CARE_PROVIDER_SITE_OTHER): Payer: Self-pay | Admitting: Pharmacist

## 2015-09-05 DIAGNOSIS — Z954 Presence of other heart-valve replacement: Secondary | ICD-10-CM

## 2015-09-05 DIAGNOSIS — Z5181 Encounter for therapeutic drug level monitoring: Secondary | ICD-10-CM

## 2015-09-05 DIAGNOSIS — Z7901 Long term (current) use of anticoagulants: Secondary | ICD-10-CM

## 2015-09-05 DIAGNOSIS — Z952 Presence of prosthetic heart valve: Secondary | ICD-10-CM

## 2015-09-05 LAB — POCT INR: INR: 2.3

## 2015-09-07 ENCOUNTER — Encounter (HOSPITAL_COMMUNITY): Payer: Medicare Other

## 2015-09-07 ENCOUNTER — Encounter (HOSPITAL_COMMUNITY): Payer: Medicare Other | Attending: Hematology & Oncology

## 2015-09-07 VITALS — BP 148/57 | HR 62

## 2015-09-07 DIAGNOSIS — N183 Chronic kidney disease, stage 3 unspecified: Secondary | ICD-10-CM

## 2015-09-07 DIAGNOSIS — D631 Anemia in chronic kidney disease: Secondary | ICD-10-CM | POA: Insufficient documentation

## 2015-09-07 DIAGNOSIS — N189 Chronic kidney disease, unspecified: Secondary | ICD-10-CM | POA: Diagnosis present

## 2015-09-07 DIAGNOSIS — C649 Malignant neoplasm of unspecified kidney, except renal pelvis: Secondary | ICD-10-CM | POA: Insufficient documentation

## 2015-09-07 LAB — HEMOGLOBIN: Hemoglobin: 10.6 g/dL — ABNORMAL LOW (ref 13.0–17.0)

## 2015-09-07 MED ORDER — DARBEPOETIN ALFA 150 MCG/0.3ML IJ SOSY
PREFILLED_SYRINGE | INTRAMUSCULAR | Status: AC
  Filled 2015-09-07: qty 0.3

## 2015-09-07 MED ORDER — DARBEPOETIN ALFA 150 MCG/0.3ML IJ SOSY
150.0000 ug | PREFILLED_SYRINGE | Freq: Once | INTRAMUSCULAR | Status: AC
  Administered 2015-09-07: 150 ug via SUBCUTANEOUS

## 2015-09-07 NOTE — Patient Instructions (Signed)
[facility] at [facility] Discharge Instructions  RECOMMENDATIONS MADE BY THE CONSULTANT AND ANY TEST RESULTS WILL BE SENT TO YOUR REFERRING PHYSICIAN. Aranesp today.    Thank you for choosing [facility] at [facility] to provide your oncology and hematology care.  To afford each patient quality time with our provider, please arrive at least 15 minutes before your scheduled appointment time.   Beginning January 23rd 2017 lab work for the Cancer Center will be done in the  Main lab at [facility] on 1st floor. If you have a lab appointment with the Cancer Center please come in thru the  Main Entrance and check in at the main information desk  You need to re-schedule your appointment should you arrive 10 or more minutes late.  We strive to give you quality time with our providers, and arriving late affects you and other patients whose appointments are after yours.  Also, if you no show three or more times for appointments you may be dismissed from the clinic at the providers discretion.     Again, thank you for choosing [facility].  Our hope is that these requests will decrease the amount of time that you wait before being seen by our physicians.       _____________________________________________________________  Should you have questions after your visit to  Cancer Center, please contact our office at [phone] between the hours of 8:30 a.m. and 4:30 p.m.  Voicemails left after 4:30 p.m. will not be returned until the following business day.  For prescription refill requests, have your pharmacy contact our office.         Resources For Cancer Patients and their Caregivers ? American Cancer Society: Can assist with transportation, wigs, general needs, runs Look Good Feel Better.        1-888-227-6333 ? Cancer Care: Provides financial assistance, online support groups, medication/co-pay assistance.  1-800-813-HOPE  (4673) ? [facility] Assists [place] cancer patients and their families through emotional , educational and financial support.  336-427-4357 ? [place] DSS Where to apply for food stamps, Medicaid and utility assistance. 336-342-1394 ? RCATS: Transportation to medical appointments. 336-347-2287 ? Social Security Administration: May apply for disability if have a Stage IV cancer. 336-342-7796 1-800-772-1213 ? [place] Aging, Disability and Transit Services: Assists with nutrition, care and transit needs. 336-349-2343  Cancer Center Support Programs: @10RELATIVEDAYS@ > Cancer Support Group  2nd Tuesday of the month 1pm-2pm, Journey Room  > Creative Journey  3rd Tuesday of the month 1130am-1pm, Journey Room  > Look Good Feel Better  1st Wednesday of the month 10am-12 noon, Journey Room (Call American Cancer Society to register 1-800-395-5775)    

## 2015-09-07 NOTE — Progress Notes (Signed)
Jeremy [name] presents today for injection per MD orders. Aranesp 150mcg administered SQ in left Abdomen. Administration without incident. Patient tolerated well.  

## 2015-09-08 LAB — TESTOSTERONE: TESTOSTERONE: 560 ng/dL (ref 348–1197)

## 2015-09-08 LAB — TESTOSTERONE, FREE: Testosterone, Free: 4.1 pg/mL — ABNORMAL LOW (ref 7.2–24.0)

## 2015-09-13 ENCOUNTER — Ambulatory Visit (HOSPITAL_COMMUNITY): Payer: Medicare Other

## 2015-09-13 DIAGNOSIS — Z954 Presence of other heart-valve replacement: Secondary | ICD-10-CM | POA: Diagnosis not present

## 2015-09-13 DIAGNOSIS — Z7901 Long term (current) use of anticoagulants: Secondary | ICD-10-CM | POA: Diagnosis not present

## 2015-09-13 LAB — POCT INR: INR: 2.4

## 2015-09-14 ENCOUNTER — Encounter (HOSPITAL_BASED_OUTPATIENT_CLINIC_OR_DEPARTMENT_OTHER): Payer: Medicare Other

## 2015-09-14 DIAGNOSIS — E291 Testicular hypofunction: Secondary | ICD-10-CM

## 2015-09-14 MED ORDER — TESTOSTERONE CYPIONATE 200 MG/ML IM SOLN
INTRAMUSCULAR | Status: AC
  Filled 2015-09-14: qty 1

## 2015-09-14 MED ORDER — TESTOSTERONE CYPIONATE 200 MG/ML IM SOLN
100.0000 mg | Freq: Once | INTRAMUSCULAR | Status: AC
  Administered 2015-09-14: 100 mg via INTRAMUSCULAR

## 2015-09-14 NOTE — Progress Notes (Signed)
Jeremy [name] presents today for injection per MD orders. Testoterone administered  in right Gluteal. Administration without incident. Patient tolerated well.

## 2015-09-17 ENCOUNTER — Ambulatory Visit (INDEPENDENT_AMBULATORY_CARE_PROVIDER_SITE_OTHER): Payer: Self-pay | Admitting: Pharmacist

## 2015-09-17 DIAGNOSIS — Z7901 Long term (current) use of anticoagulants: Secondary | ICD-10-CM

## 2015-09-17 DIAGNOSIS — Z954 Presence of other heart-valve replacement: Secondary | ICD-10-CM

## 2015-09-17 DIAGNOSIS — Z5181 Encounter for therapeutic drug level monitoring: Secondary | ICD-10-CM

## 2015-09-17 DIAGNOSIS — Z952 Presence of prosthetic heart valve: Secondary | ICD-10-CM

## 2015-09-17 NOTE — Progress Notes (Signed)
No charge for labs or visit - INR check through home monitoring system  

## 2015-09-20 ENCOUNTER — Ambulatory Visit (INDEPENDENT_AMBULATORY_CARE_PROVIDER_SITE_OTHER): Payer: Self-pay | Admitting: Pharmacist

## 2015-09-20 DIAGNOSIS — Z952 Presence of prosthetic heart valve: Secondary | ICD-10-CM

## 2015-09-20 DIAGNOSIS — Z7901 Long term (current) use of anticoagulants: Secondary | ICD-10-CM

## 2015-09-20 DIAGNOSIS — Z954 Presence of other heart-valve replacement: Secondary | ICD-10-CM

## 2015-09-20 DIAGNOSIS — Z5181 Encounter for therapeutic drug level monitoring: Secondary | ICD-10-CM

## 2015-09-20 LAB — POCT INR: INR: 2.2

## 2015-09-20 NOTE — Progress Notes (Signed)
No charge for labs or visit - INR check through home monitoring system  

## 2015-09-24 ENCOUNTER — Other Ambulatory Visit (HOSPITAL_COMMUNITY): Payer: Self-pay | Admitting: Oncology

## 2015-09-24 ENCOUNTER — Encounter (HOSPITAL_COMMUNITY): Payer: Medicare Other

## 2015-09-24 VITALS — BP 190/70 | HR 75 | Wt 196.0 lb

## 2015-09-24 DIAGNOSIS — C649 Malignant neoplasm of unspecified kidney, except renal pelvis: Secondary | ICD-10-CM

## 2015-09-24 DIAGNOSIS — I509 Heart failure, unspecified: Secondary | ICD-10-CM

## 2015-09-24 DIAGNOSIS — N183 Chronic kidney disease, stage 3 unspecified: Secondary | ICD-10-CM

## 2015-09-24 DIAGNOSIS — N189 Chronic kidney disease, unspecified: Secondary | ICD-10-CM

## 2015-09-24 DIAGNOSIS — D631 Anemia in chronic kidney disease: Secondary | ICD-10-CM | POA: Diagnosis not present

## 2015-09-24 LAB — IRON AND TIBC
IRON: 68 ug/dL (ref 45–182)
SATURATION RATIOS: 23 % (ref 17.9–39.5)
TIBC: 290 ug/dL (ref 250–450)
UIBC: 222 ug/dL

## 2015-09-24 LAB — COMPREHENSIVE METABOLIC PANEL
ALBUMIN: 3.6 g/dL (ref 3.5–5.0)
ALT: 23 U/L (ref 17–63)
ANION GAP: 3 — AB (ref 5–15)
AST: 31 U/L (ref 15–41)
Alkaline Phosphatase: 131 U/L — ABNORMAL HIGH (ref 38–126)
BUN: 97 mg/dL — ABNORMAL HIGH (ref 6–20)
CALCIUM: 9.1 mg/dL (ref 8.9–10.3)
CHLORIDE: 115 mmol/L — AB (ref 101–111)
CO2: 19 mmol/L — AB (ref 22–32)
Creatinine, Ser: 3.19 mg/dL — ABNORMAL HIGH (ref 0.61–1.24)
GFR calc non Af Amer: 20 mL/min — ABNORMAL LOW (ref >60)
GFR, EST AFRICAN AMERICAN: 23 mL/min — AB (ref >60)
GLUCOSE: 127 mg/dL — AB (ref 65–99)
POTASSIUM: 5.1 mmol/L (ref 3.5–5.1)
Sodium: 137 mmol/L (ref 135–145)
Total Bilirubin: 0.7 mg/dL (ref 0.3–1.2)
Total Protein: 6 g/dL — ABNORMAL LOW (ref 6.5–8.1)

## 2015-09-24 LAB — PROTEIN / CREATININE RATIO, URINE
CREATININE, URINE: 131.02 mg/dL
PROTEIN CREATININE RATIO: 1.03 mg/mg{creat} — AB (ref 0.00–0.15)
TOTAL PROTEIN, URINE: 135 mg/dL

## 2015-09-24 LAB — CBC WITH DIFFERENTIAL/PLATELET
BASOS PCT: 1 %
Basophils Absolute: 0 10*3/uL (ref 0.0–0.1)
EOS PCT: 3 %
Eosinophils Absolute: 0.1 10*3/uL (ref 0.0–0.7)
HEMATOCRIT: 37.8 % — AB (ref 39.0–52.0)
Hemoglobin: 11.5 g/dL — ABNORMAL LOW (ref 13.0–17.0)
Lymphocytes Relative: 22 %
Lymphs Abs: 0.9 10*3/uL (ref 0.7–4.0)
MCH: 28 pg (ref 26.0–34.0)
MCHC: 30.4 g/dL (ref 30.0–36.0)
MCV: 92 fL (ref 78.0–100.0)
MONO ABS: 0.2 10*3/uL (ref 0.1–1.0)
MONOS PCT: 6 %
NEUTROS ABS: 2.7 10*3/uL (ref 1.7–7.7)
Neutrophils Relative %: 69 %
RBC: 4.11 MIL/uL — ABNORMAL LOW (ref 4.22–5.81)
RDW: 15.5 % (ref 11.5–15.5)
WBC: 3.9 10*3/uL — ABNORMAL LOW (ref 4.0–10.5)

## 2015-09-24 LAB — URINALYSIS, ROUTINE W REFLEX MICROSCOPIC
BILIRUBIN URINE: NEGATIVE
GLUCOSE, UA: NEGATIVE mg/dL
KETONES UR: NEGATIVE mg/dL
Leukocytes, UA: NEGATIVE
Nitrite: NEGATIVE
Specific Gravity, Urine: 1.025 (ref 1.005–1.030)
pH: 5.5 (ref 5.0–8.0)

## 2015-09-24 LAB — FERRITIN: FERRITIN: 379 ng/mL — AB (ref 24–336)

## 2015-09-24 LAB — URINE MICROSCOPIC-ADD ON
BACTERIA UA: NONE SEEN
Squamous Epithelial / LPF: NONE SEEN
WBC, UA: NONE SEEN WBC/hpf (ref 0–5)

## 2015-09-24 LAB — PHOSPHORUS: Phosphorus: 4.8 mg/dL — ABNORMAL HIGH (ref 2.5–4.6)

## 2015-09-24 LAB — BRAIN NATRIURETIC PEPTIDE: B NATRIURETIC PEPTIDE 5: 797 pg/mL — AB (ref 0.0–100.0)

## 2015-09-24 LAB — URIC ACID: Uric Acid, Serum: 7.2 mg/dL (ref 4.4–7.6)

## 2015-09-24 LAB — MAGNESIUM: Magnesium: 1.8 mg/dL (ref 1.7–2.4)

## 2015-09-24 NOTE — Progress Notes (Signed)
Pt's hgb was 11.5 and he did not qualify for receiving Aranesp today. Pt's weight is up 6 lbs in a week and complains of feeling swollen in the abdomen and that he can't lie flat at night to sleep. Pt reports increasing his Lasix yday to 80mg  BID instead of once daily. Kirby Crigler PA-C agreed to adding on BNP to previously drawn labs. BNP of greater than 700. Tom made aware of BNP results. Pt is on dialysis three times a week and is supposed to be contacting his nephrologist regarding approval with taking Lasix 80mg  bid.

## 2015-09-25 LAB — VITAMIN D 25 HYDROXY (VIT D DEFICIENCY, FRACTURES): Vit D, 25-Hydroxy: 43.3 ng/mL (ref 30.0–100.0)

## 2015-09-25 LAB — PTH, INTACT AND CALCIUM
CALCIUM TOTAL (PTH): 9.1 mg/dL (ref 8.7–10.2)
PTH: 85 pg/mL — AB (ref 15–65)

## 2015-09-25 LAB — TACROLIMUS LEVEL: Tacrolimus (FK506) - LabCorp: 9.4 ng/mL (ref 2.0–20.0)

## 2015-09-28 ENCOUNTER — Encounter: Payer: Self-pay | Admitting: Family

## 2015-09-28 ENCOUNTER — Ambulatory Visit (INDEPENDENT_AMBULATORY_CARE_PROVIDER_SITE_OTHER): Payer: Medicare Other | Admitting: Family

## 2015-09-28 VITALS — BP 173/66 | HR 71 | Temp 97.6°F | Ht 72.0 in | Wt 195.8 lb

## 2015-09-28 DIAGNOSIS — Z862 Personal history of diseases of the blood and blood-forming organs and certain disorders involving the immune mechanism: Secondary | ICD-10-CM | POA: Diagnosis not present

## 2015-09-28 DIAGNOSIS — N184 Chronic kidney disease, stage 4 (severe): Secondary | ICD-10-CM | POA: Diagnosis not present

## 2015-09-28 DIAGNOSIS — R197 Diarrhea, unspecified: Secondary | ICD-10-CM | POA: Diagnosis not present

## 2015-09-28 DIAGNOSIS — K529 Noninfective gastroenteritis and colitis, unspecified: Secondary | ICD-10-CM

## 2015-09-28 DIAGNOSIS — N179 Acute kidney failure, unspecified: Secondary | ICD-10-CM | POA: Diagnosis not present

## 2015-09-28 DIAGNOSIS — I129 Hypertensive chronic kidney disease with stage 1 through stage 4 chronic kidney disease, or unspecified chronic kidney disease: Secondary | ICD-10-CM | POA: Diagnosis not present

## 2015-09-28 DIAGNOSIS — N183 Chronic kidney disease, stage 3 unspecified: Secondary | ICD-10-CM

## 2015-09-28 DIAGNOSIS — Z940 Kidney transplant status: Secondary | ICD-10-CM | POA: Diagnosis not present

## 2015-09-28 LAB — POCT INR: INR: 2.6

## 2015-09-28 MED ORDER — DIPHENOXYLATE-ATROPINE 2.5-0.025 MG PO TABS
1.0000 | ORAL_TABLET | Freq: Four times a day (QID) | ORAL | Status: DC | PRN
Start: 2015-09-28 — End: 2016-08-08

## 2015-09-28 NOTE — Progress Notes (Signed)
   Subjective:    Patient ID: Jeremy [name], male    DOB: 21-May-1955, 60 y.o.   MRN: [id number]  PT presents to the office today with chronic diarrhea. Pt states this has been going on and off since his kidney transplant in 2014. PT states he saw his nephrolgoists today and was told his creatinine was going up because he was dehydrated related to the diarrhea. Pt states he had seen GI in the past and was placed on Bentyl with no relief.  Diarrhea  This is a chronic problem. The current episode started more than 1 year ago. The problem occurs 5 to 10 times per day. The problem has been waxing and waning. The stool consistency is described as watery. Pertinent negatives include no abdominal pain, bloating, chills, headaches, increased  flatus, sweats, vomiting or weight loss. Nothing aggravates the symptoms. There are no known risk factors. Treatments tried: bentyl. The treatment provided no relief.      Review of Systems  Constitutional: Negative for chills and weight loss.  Cardiovascular: Positive for leg swelling.  Gastrointestinal: Positive for diarrhea. Negative for vomiting, abdominal pain, constipation, blood in stool, anal bleeding, rectal pain, bloating and flatus.  Neurological: Negative for headaches.       Objective:   Physical Exam  Constitutional: He is oriented to person, place, and time. He appears well-developed and well-nourished. No distress.  HENT:  Head: Normocephalic.  Eyes: Pupils are equal, round, and reactive to light. Right eye exhibits no discharge. Left eye exhibits no discharge.  Neck: Normal range of motion. Neck supple. No thyromegaly present.  Cardiovascular: Normal rate, regular rhythm, normal heart sounds and intact distal pulses.   No murmur heard. Pulmonary/Chest: Effort normal and breath sounds normal. No respiratory distress. He has no wheezes.  Abdominal: Soft. Bowel sounds are normal. He exhibits distension.  umbilical hernia present     Musculoskeletal: Normal range of motion. He exhibits edema (3+ in BLE). He exhibits no tenderness.  Neurological: He is alert and oriented to person, place, and time.  Skin: Skin is warm and dry. No rash noted. No erythema.  Psychiatric: He has a normal mood and affect. His behavior is normal. Judgment and thought content normal.  Vitals reviewed.    BP 173/66 mmHg  Pulse 71  Temp(Src) 97.6 F (36.4 C) (Oral)  Ht 6' (1.829 m)  Wt 195 lb 12.8 oz (88.814 kg)  BMI 26.55 kg/m2      Assessment & Plan:  1. Chronic diarrhea -Force fluids Lomotil given to pt today Referral to GI pending - diphenoxylate-atropine (LOMOTIL) 2.5-0.025 MG tablet; Take 1 tablet by mouth 4 (four) times daily as needed for diarrhea or loose stools.  Dispense: 60 tablet; Refill: 0 - Ambulatory referral to Gastroenterology  2. Chronic kidney disease, stage III (moderate) -Keep all appts with nephrologists   3. History of kidney transplant  Evelina Dun, FNP

## 2015-09-28 NOTE — Patient Instructions (Signed)
Chronic Diarrhea Diarrhea is frequent loose and watery bowel movements. It can cause you to feel weak and dehydrated. Dehydration can cause you to become tired and thirsty and to have a dry mouth, decreased urination, and dark yellow urine. Diarrhea is a sign of another problem, most often an infection that will not last long. In most cases, diarrhea lasts 2-3 days. Diarrhea that lasts longer than 4 weeks is called long-lasting (chronic) diarrhea. It is important to treat your diarrhea as directed by your health care provider to lessen or prevent future episodes of diarrhea.  CAUSES  There are many causes of chronic diarrhea. The following are some possible causes:   Gastrointestinal infections caused by viruses, bacteria, or parasites.   Food poisoning or food allergies.   Certain medicines, such as antibiotics, chemotherapy, and laxatives.   Artificial sweeteners and fructose.   Digestive disorders, such as celiac disease and inflammatory bowel diseases.   Irritable bowel syndrome.  Some disorders of the pancreas.  Disorders of the thyroid.  Reduced blood flow to the intestines.  Cancer. Sometimes the cause of chronic diarrhea is unknown. RISK FACTORS  Having a severely weakened immune system, such as from HIV or AIDS.   Taking certain types of cancer-fighting drugs (such as with chemotherapy) or other medicines.   Having had a recent organ transplant.   Having a portion of the stomach or small bowel removed.   Traveling to countries where food and water supplies are often contaminated.  SYMPTOMS  In addition to frequent, loose stools, diarrhea may cause:   Cramping.   Abdominal pain.   Nausea.   Fever.  Fatigue.  Urgent need to use the bathroom.  Loss of bowel control. DIAGNOSIS  Your health care provider must take a careful history and perform a physical exam. Tests given are based on your symptoms and history. Tests may include:   Blood or  stool tests. Three or more stool samples may be examined. Stool cultures may be used to test for bacteria or parasites.   X-rays.   A procedure in which a thin tube is inserted into the mouth or rectum (endoscopy). This allows the health care provider to look inside the intestine.  TREATMENT   Treatment is aimed at correcting the cause of the diarrhea when possible.  Diarrhea caused by an infection can often be treated with antibiotic medicines.  Diarrhea not caused by an infection may require you to take long-term medicine or have surgery. Specific treatment should be discussed with your health care provider.  If the cause cannot be determined, treatment aims to relieve symptoms and prevent dehydration. Serious health problems can occur if you do not maintain proper fluid levels. Treatment may include:  Taking an oral rehydration solution (ORS).  Not drinking beverages that contain caffeine (such as tea, coffee, and soft drinks).  Not drinking alcohol.  Maintaining well-balanced nutrition to help you recover faster. HOME CARE INSTRUCTIONS   Drink enough fluids to keep urine clear or pale yellow. Drink 1 cup (8 oz) of fluid for each diarrhea episode. Avoid fluids that contain simple sugars, fruit juices, whole milk products, and sodas. Hydrate with an ORS. You may purchase the ORS or prepare it at home by mixing the following ingredients together:   - tsp (1.7-3  mL) table salt.   tsp (3  mL) baking soda.   tsp (1.7 mL) salt substitute containing potassium chloride.  1 tbsp (20 mL) sugar.  4.2 c (1 L) of water.     Certain foods and beverages may increase the speed at which food moves through the gastrointestinal (GI) tract. These foods and beverages should be avoided. They include:  Caffeinated and alcoholic beverages.  High-fiber foods, such as raw fruits and vegetables, nuts, seeds, and whole grain breads and cereals.  Foods and beverages sweetened with sugar  alcohols, such as xylitol, sorbitol, and mannitol.   Some foods may be well tolerated and may help thicken stool. These include:  Starchy foods, such as rice, toast, pasta, low-sugar cereal, oatmeal, grits, baked potatoes, crackers, and bagels.  Bananas.  Applesauce.  Add probiotic-rich foods to help increase healthy bacteria in the GI tract. These include yogurt and fermented milk products.  Wash your hands well after each diarrhea episode.  Only take over-the-counter or prescription medicines as directed by your health care provider.  Take a warm bath to relieve any burning or pain from frequent diarrhea episodes. SEEK MEDICAL CARE IF:   You are not urinating as often.  Your urine is a dark color.  You become very tired or dizzy.  You have severe pain in the abdomen or rectum.  Your have blood or pus in your stools.  Your stools look black and tarry. SEEK IMMEDIATE MEDICAL CARE IF:   You are unable to keep fluids down.  You have persistent vomiting.  You have blood in your stool.  Your stools are black and tarry.  You do not urinate in 6-8 hours, or there is only a small amount of very dark urine.  You have abdominal pain that increases or localizes.  You have weakness, dizziness, confusion, or lightheadedness.  You have a severe headache.  Your diarrhea gets worse or does not get better.  You have a fever or persistent symptoms for more than 2-3 days.  You have a fever and your symptoms suddenly get worse. MAKE SURE YOU:   Understand these instructions.  Will watch your condition.  Will get help right away if you are not doing well or get worse.   This information is not intended to replace advice given to you by your health care provider. Make sure you discuss any questions you have with your health care provider.   Document Released: 06/14/2003 Document Revised: 03/29/2013 Document Reviewed: 09/16/2012 Elsevier Interactive Patient Education 2016  Elsevier Inc.  

## 2015-10-01 ENCOUNTER — Ambulatory Visit (INDEPENDENT_AMBULATORY_CARE_PROVIDER_SITE_OTHER): Payer: Self-pay | Admitting: Pharmacist

## 2015-10-01 DIAGNOSIS — Z952 Presence of prosthetic heart valve: Secondary | ICD-10-CM

## 2015-10-01 DIAGNOSIS — N184 Chronic kidney disease, stage 4 (severe): Secondary | ICD-10-CM | POA: Diagnosis not present

## 2015-10-01 DIAGNOSIS — R3129 Other microscopic hematuria: Secondary | ICD-10-CM | POA: Insufficient documentation

## 2015-10-01 DIAGNOSIS — Z9489 Other transplanted organ and tissue status: Secondary | ICD-10-CM | POA: Diagnosis not present

## 2015-10-01 DIAGNOSIS — Z954 Presence of other heart-valve replacement: Secondary | ICD-10-CM

## 2015-10-01 DIAGNOSIS — Z5181 Encounter for therapeutic drug level monitoring: Secondary | ICD-10-CM

## 2015-10-01 DIAGNOSIS — C649 Malignant neoplasm of unspecified kidney, except renal pelvis: Secondary | ICD-10-CM | POA: Diagnosis not present

## 2015-10-01 DIAGNOSIS — Z7901 Long term (current) use of anticoagulants: Secondary | ICD-10-CM

## 2015-10-01 NOTE — Progress Notes (Signed)
No charge for labs or visit - INR check through home monitoring system  

## 2015-10-03 DIAGNOSIS — E872 Acidosis: Secondary | ICD-10-CM | POA: Diagnosis not present

## 2015-10-03 DIAGNOSIS — D631 Anemia in chronic kidney disease: Secondary | ICD-10-CM | POA: Diagnosis not present

## 2015-10-03 DIAGNOSIS — N186 End stage renal disease: Secondary | ICD-10-CM | POA: Diagnosis not present

## 2015-10-03 DIAGNOSIS — R05 Cough: Secondary | ICD-10-CM | POA: Diagnosis not present

## 2015-10-03 DIAGNOSIS — Z940 Kidney transplant status: Secondary | ICD-10-CM | POA: Diagnosis not present

## 2015-10-03 DIAGNOSIS — N068 Isolated proteinuria with other morphologic lesion: Secondary | ICD-10-CM | POA: Diagnosis not present

## 2015-10-03 DIAGNOSIS — Z4822 Encounter for aftercare following kidney transplant: Secondary | ICD-10-CM | POA: Diagnosis not present

## 2015-10-03 DIAGNOSIS — E8770 Fluid overload, unspecified: Secondary | ICD-10-CM | POA: Diagnosis not present

## 2015-10-03 DIAGNOSIS — D899 Disorder involving the immune mechanism, unspecified: Secondary | ICD-10-CM | POA: Diagnosis not present

## 2015-10-03 DIAGNOSIS — C649 Malignant neoplasm of unspecified kidney, except renal pelvis: Secondary | ICD-10-CM | POA: Diagnosis not present

## 2015-10-03 DIAGNOSIS — I120 Hypertensive chronic kidney disease with stage 5 chronic kidney disease or end stage renal disease: Secondary | ICD-10-CM | POA: Diagnosis not present

## 2015-10-03 DIAGNOSIS — Z888 Allergy status to other drugs, medicaments and biological substances status: Secondary | ICD-10-CM | POA: Diagnosis not present

## 2015-10-03 DIAGNOSIS — E785 Hyperlipidemia, unspecified: Secondary | ICD-10-CM | POA: Diagnosis not present

## 2015-10-03 DIAGNOSIS — Z951 Presence of aortocoronary bypass graft: Secondary | ICD-10-CM | POA: Diagnosis not present

## 2015-10-03 DIAGNOSIS — I2510 Atherosclerotic heart disease of native coronary artery without angina pectoris: Secondary | ICD-10-CM | POA: Diagnosis not present

## 2015-10-03 DIAGNOSIS — Z79899 Other long term (current) drug therapy: Secondary | ICD-10-CM | POA: Diagnosis not present

## 2015-10-03 DIAGNOSIS — Z7901 Long term (current) use of anticoagulants: Secondary | ICD-10-CM | POA: Diagnosis not present

## 2015-10-03 DIAGNOSIS — N183 Chronic kidney disease, stage 3 (moderate): Secondary | ICD-10-CM | POA: Diagnosis not present

## 2015-10-04 ENCOUNTER — Ambulatory Visit (INDEPENDENT_AMBULATORY_CARE_PROVIDER_SITE_OTHER): Payer: Self-pay | Admitting: Pharmacist

## 2015-10-04 DIAGNOSIS — Z7901 Long term (current) use of anticoagulants: Secondary | ICD-10-CM

## 2015-10-04 DIAGNOSIS — Z952 Presence of prosthetic heart valve: Secondary | ICD-10-CM

## 2015-10-04 DIAGNOSIS — Z954 Presence of other heart-valve replacement: Secondary | ICD-10-CM

## 2015-10-04 DIAGNOSIS — Z5181 Encounter for therapeutic drug level monitoring: Secondary | ICD-10-CM

## 2015-10-04 LAB — POCT INR: INR: 2.7

## 2015-10-05 ENCOUNTER — Encounter (HOSPITAL_COMMUNITY): Payer: Self-pay

## 2015-10-05 ENCOUNTER — Other Ambulatory Visit (HOSPITAL_COMMUNITY): Payer: Self-pay | Admitting: Oncology

## 2015-10-05 ENCOUNTER — Encounter (HOSPITAL_COMMUNITY): Payer: Medicare Other

## 2015-10-05 ENCOUNTER — Encounter (HOSPITAL_BASED_OUTPATIENT_CLINIC_OR_DEPARTMENT_OTHER): Payer: Medicare Other

## 2015-10-05 VITALS — BP 175/60 | HR 60 | Temp 97.7°F | Resp 18

## 2015-10-05 DIAGNOSIS — N189 Chronic kidney disease, unspecified: Principal | ICD-10-CM

## 2015-10-05 DIAGNOSIS — E291 Testicular hypofunction: Secondary | ICD-10-CM | POA: Diagnosis not present

## 2015-10-05 DIAGNOSIS — N183 Chronic kidney disease, stage 3 unspecified: Secondary | ICD-10-CM

## 2015-10-05 DIAGNOSIS — D631 Anemia in chronic kidney disease: Secondary | ICD-10-CM

## 2015-10-05 DIAGNOSIS — C649 Malignant neoplasm of unspecified kidney, except renal pelvis: Secondary | ICD-10-CM | POA: Diagnosis not present

## 2015-10-05 LAB — CBC
HEMATOCRIT: 32.9 % — AB (ref 39.0–52.0)
HEMOGLOBIN: 10.1 g/dL — AB (ref 13.0–17.0)
MCH: 27.7 pg (ref 26.0–34.0)
MCHC: 30.7 g/dL (ref 30.0–36.0)
MCV: 90.4 fL (ref 78.0–100.0)
Platelets: 95 10*3/uL — ABNORMAL LOW (ref 150–400)
RBC: 3.64 MIL/uL — ABNORMAL LOW (ref 4.22–5.81)
RDW: 14.8 % (ref 11.5–15.5)
WBC: 2.9 10*3/uL — ABNORMAL LOW (ref 4.0–10.5)

## 2015-10-05 MED ORDER — DARBEPOETIN ALFA 150 MCG/0.3ML IJ SOSY
150.0000 ug | PREFILLED_SYRINGE | Freq: Once | INTRAMUSCULAR | Status: AC
  Administered 2015-10-05: 150 ug via SUBCUTANEOUS

## 2015-10-05 MED ORDER — DARBEPOETIN ALFA 150 MCG/0.3ML IJ SOSY
PREFILLED_SYRINGE | INTRAMUSCULAR | Status: AC
  Filled 2015-10-05: qty 0.3

## 2015-10-05 MED ORDER — TESTOSTERONE CYPIONATE 200 MG/ML IM SOLN
INTRAMUSCULAR | Status: AC
  Filled 2015-10-05: qty 1

## 2015-10-05 MED ORDER — TESTOSTERONE CYPIONATE 200 MG/ML IM SOLN
100.0000 mg | INTRAMUSCULAR | Status: DC
  Administered 2015-10-05: 100 mg via INTRAMUSCULAR

## 2015-10-05 NOTE — Patient Instructions (Signed)
[place] at [facility] Discharge Instructions  RECOMMENDATIONS MADE BY THE CONSULTANT AND ANY TEST RESULTS WILL BE SENT TO YOUR REFERRING PHYSICIAN.  Testosterone and aranesp today Follow up as scheduled  Please call the clinic if you have any questions or concerns   Thank you for choosing [place] at [facility] to provide your oncology and hematology care.  To afford each patient quality time with our provider, please arrive at least 15 minutes before your scheduled appointment time.   Beginning January 23rd 2017 lab work for the [facility] will be done in the  Main lab at [facility] on 1st floor. If you have a lab appointment with the [facility] please come in thru the  Main Entrance and check in at the main information desk  You need to re-schedule your appointment should you arrive 10 or more minutes late.  We strive to give you quality time with our providers, and arriving late affects you and other patients whose appointments are after yours.  Also, if you no show three or more times for appointments you may be dismissed from the clinic at the providers discretion.     Again, thank you for choosing [facility].  Our hope is that these requests will decrease the amount of time that you wait before being seen by our physicians.       _____________________________________________________________  Should you have questions after your visit to [facility], please contact our office at [phone] between the hours of 8:30 a.m. and 4:30 p.m.  Voicemails left after 4:30 p.m. will not be returned until the following business day.  For prescription refill requests, have your pharmacy contact our office.         Resources For Cancer Patients and their Caregivers ? American Cancer Society: Can assist with transportation, wigs, general needs, runs Look Good Feel Better.        267-807-8016 ? Cancer  Care: Provides financial assistance, online support groups, medication/co-pay assistance.  1-800-813-HOPE 5124800195) ? [place] Assists [place] cancer patients and their families through emotional , educational and financial support.  (312)103-5881 ? [place] DSS Where to apply for food stamps, Medicaid and utility assistance. 225-271-6988 ? RCATS: Transportation to medical appointments. 989-604-3538 ? Social Security Administration: May apply for disability if have a Stage IV cancer. 380-031-8156 2091711375 ? [employer], Disability and Transit Services: Assists with nutrition, care and transit needs. [place] Support Programs: @10RELATIVEDAYS @ > Cancer Support Group  2nd Tuesday of the month 1pm-2pm, Journey Room  > Creative Journey  3rd Tuesday of the month 1130am-1pm, Journey Room  > Look Good Feel Better  1st Wednesday of the month 10am-12 noon, Journey Room (Call [place] to register [phone])

## 2015-10-05 NOTE — Progress Notes (Signed)
Jeremy [name] presents today for injection per the provider's orders.  aranesp 158mcg and testoterone administration without incident; see MAR for injection details.  Patient tolerated procedure well and without incident.  No questions or complaints noted at this time.

## 2015-10-10 ENCOUNTER — Ambulatory Visit (INDEPENDENT_AMBULATORY_CARE_PROVIDER_SITE_OTHER): Payer: 59 | Admitting: Pharmacist

## 2015-10-10 DIAGNOSIS — Z7901 Long term (current) use of anticoagulants: Secondary | ICD-10-CM | POA: Diagnosis not present

## 2015-10-10 DIAGNOSIS — Z952 Presence of prosthetic heart valve: Secondary | ICD-10-CM

## 2015-10-10 DIAGNOSIS — Z5181 Encounter for therapeutic drug level monitoring: Secondary | ICD-10-CM

## 2015-10-10 DIAGNOSIS — Z954 Presence of other heart-valve replacement: Secondary | ICD-10-CM | POA: Diagnosis not present

## 2015-10-10 LAB — POCT INR: INR: 3.1

## 2015-10-10 NOTE — Progress Notes (Signed)
Subjective:     Indication: prosthetic heart valve Bleeding signs/symptoms: None Thromboembolic signs/symptoms: None  Missed Coumadin doses: None Medication changes: no Dietary changes: no Bacterial/viral infection: no Other concerns: no  {  Objective:    INR Today: 3.1 Current dose: 2mg  daily     Assessment:    Supratherapeutic INR for goal of 2-3   Plan:    1. New dose: take 1mg  instead of 2mg  today, then continue warfarin 2mg  daily   2. Next INR: 1 week    Patient checks INR at home with Home INR monitoring.  Billing once per month interupertation fee.  Procedure code if AM:717163

## 2015-10-17 ENCOUNTER — Ambulatory Visit (INDEPENDENT_AMBULATORY_CARE_PROVIDER_SITE_OTHER): Payer: Self-pay | Admitting: Pharmacist

## 2015-10-17 DIAGNOSIS — Z7901 Long term (current) use of anticoagulants: Secondary | ICD-10-CM

## 2015-10-17 DIAGNOSIS — Z954 Presence of other heart-valve replacement: Secondary | ICD-10-CM

## 2015-10-17 DIAGNOSIS — Z5181 Encounter for therapeutic drug level monitoring: Secondary | ICD-10-CM

## 2015-10-17 DIAGNOSIS — Z952 Presence of prosthetic heart valve: Secondary | ICD-10-CM

## 2015-10-17 LAB — POCT INR: INR: 2.1

## 2015-10-17 NOTE — Progress Notes (Signed)
No charge for labs or visit - INR check through home monitoring system  

## 2015-10-18 ENCOUNTER — Encounter: Payer: Self-pay | Admitting: Family Medicine

## 2015-10-18 ENCOUNTER — Other Ambulatory Visit: Payer: Self-pay | Admitting: *Deleted

## 2015-10-18 DIAGNOSIS — K529 Noninfective gastroenteritis and colitis, unspecified: Secondary | ICD-10-CM

## 2015-10-18 NOTE — Progress Notes (Signed)
GI referral was placed on 6/23 to Dr. Roseanne Kaufman office, this referral was cancelled by Rourk's office since he is establised w/ Dr. Laural Golden. New order placed

## 2015-10-19 ENCOUNTER — Encounter (HOSPITAL_BASED_OUTPATIENT_CLINIC_OR_DEPARTMENT_OTHER): Payer: Medicare Other

## 2015-10-19 ENCOUNTER — Encounter (HOSPITAL_COMMUNITY): Payer: Self-pay | Admitting: Hematology & Oncology

## 2015-10-19 ENCOUNTER — Encounter (HOSPITAL_COMMUNITY): Payer: Medicare Other

## 2015-10-19 ENCOUNTER — Encounter (HOSPITAL_COMMUNITY): Payer: Medicare Other | Attending: Hematology & Oncology | Admitting: Hematology & Oncology

## 2015-10-19 VITALS — HR 59 | Temp 97.6°F | Resp 18 | Wt 205.9 lb

## 2015-10-19 DIAGNOSIS — E785 Hyperlipidemia, unspecified: Secondary | ICD-10-CM | POA: Insufficient documentation

## 2015-10-19 DIAGNOSIS — G4733 Obstructive sleep apnea (adult) (pediatric): Secondary | ICD-10-CM | POA: Insufficient documentation

## 2015-10-19 DIAGNOSIS — I129 Hypertensive chronic kidney disease with stage 1 through stage 4 chronic kidney disease, or unspecified chronic kidney disease: Secondary | ICD-10-CM | POA: Insufficient documentation

## 2015-10-19 DIAGNOSIS — D631 Anemia in chronic kidney disease: Secondary | ICD-10-CM

## 2015-10-19 DIAGNOSIS — Z940 Kidney transplant status: Secondary | ICD-10-CM | POA: Diagnosis not present

## 2015-10-19 DIAGNOSIS — D649 Anemia, unspecified: Secondary | ICD-10-CM | POA: Diagnosis not present

## 2015-10-19 DIAGNOSIS — E291 Testicular hypofunction: Secondary | ICD-10-CM

## 2015-10-19 DIAGNOSIS — N183 Chronic kidney disease, stage 3 unspecified: Secondary | ICD-10-CM

## 2015-10-19 DIAGNOSIS — Z7901 Long term (current) use of anticoagulants: Secondary | ICD-10-CM | POA: Diagnosis not present

## 2015-10-19 DIAGNOSIS — Z9889 Other specified postprocedural states: Secondary | ICD-10-CM | POA: Insufficient documentation

## 2015-10-19 DIAGNOSIS — Z79899 Other long term (current) drug therapy: Secondary | ICD-10-CM | POA: Insufficient documentation

## 2015-10-19 DIAGNOSIS — Z85528 Personal history of other malignant neoplasm of kidney: Secondary | ICD-10-CM | POA: Insufficient documentation

## 2015-10-19 DIAGNOSIS — D849 Immunodeficiency, unspecified: Secondary | ICD-10-CM

## 2015-10-19 DIAGNOSIS — M1990 Unspecified osteoarthritis, unspecified site: Secondary | ICD-10-CM | POA: Insufficient documentation

## 2015-10-19 DIAGNOSIS — N189 Chronic kidney disease, unspecified: Principal | ICD-10-CM

## 2015-10-19 DIAGNOSIS — D899 Disorder involving the immune mechanism, unspecified: Secondary | ICD-10-CM

## 2015-10-19 DIAGNOSIS — Z992 Dependence on renal dialysis: Secondary | ICD-10-CM | POA: Diagnosis not present

## 2015-10-19 DIAGNOSIS — Z952 Presence of prosthetic heart valve: Secondary | ICD-10-CM | POA: Insufficient documentation

## 2015-10-19 DIAGNOSIS — C649 Malignant neoplasm of unspecified kidney, except renal pelvis: Secondary | ICD-10-CM

## 2015-10-19 LAB — URINALYSIS, ROUTINE W REFLEX MICROSCOPIC
BILIRUBIN URINE: NEGATIVE
GLUCOSE, UA: NEGATIVE mg/dL
Leukocytes, UA: NEGATIVE
Nitrite: NEGATIVE
Specific Gravity, Urine: >1.03 — ABNORMAL HIGH (ref 1.005–1.030)
pH: 5 (ref 5.0–8.0)

## 2015-10-19 LAB — URINE MICROSCOPIC-ADD ON

## 2015-10-19 LAB — CBC WITH DIFFERENTIAL/PLATELET
BASOS PCT: 0 %
Basophils Absolute: 0 10*3/uL (ref 0.0–0.1)
EOS ABS: 0.2 10*3/uL (ref 0.0–0.7)
EOS PCT: 5 %
HEMATOCRIT: 32.1 % — AB (ref 39.0–52.0)
Hemoglobin: 9.8 g/dL — ABNORMAL LOW (ref 13.0–17.0)
Lymphocytes Relative: 19 %
Lymphs Abs: 0.7 10*3/uL (ref 0.7–4.0)
MCH: 27.7 pg (ref 26.0–34.0)
MCHC: 30.5 g/dL (ref 30.0–36.0)
MCV: 90.7 fL (ref 78.0–100.0)
MONO ABS: 0.3 10*3/uL (ref 0.1–1.0)
MONOS PCT: 7 %
NEUTROS ABS: 2.3 10*3/uL (ref 1.7–7.7)
Neutrophils Relative %: 68 %
PLATELETS: 92 10*3/uL — AB (ref 150–400)
RBC: 3.54 MIL/uL — ABNORMAL LOW (ref 4.22–5.81)
RDW: 15.7 % — AB (ref 11.5–15.5)
WBC: 3.4 10*3/uL — ABNORMAL LOW (ref 4.0–10.5)

## 2015-10-19 LAB — MAGNESIUM: MAGNESIUM: 1.9 mg/dL (ref 1.7–2.4)

## 2015-10-19 LAB — COMPREHENSIVE METABOLIC PANEL
ALBUMIN: 3.4 g/dL — AB (ref 3.5–5.0)
ALT: 29 U/L (ref 17–63)
ANION GAP: 7 (ref 5–15)
AST: 44 U/L — ABNORMAL HIGH (ref 15–41)
Alkaline Phosphatase: 126 U/L (ref 38–126)
BILIRUBIN TOTAL: 0.5 mg/dL (ref 0.3–1.2)
BUN: 112 mg/dL — ABNORMAL HIGH (ref 6–20)
CALCIUM: 9.2 mg/dL (ref 8.9–10.3)
CO2: 16 mmol/L — ABNORMAL LOW (ref 22–32)
Chloride: 115 mmol/L — ABNORMAL HIGH (ref 101–111)
Creatinine, Ser: 4.26 mg/dL — ABNORMAL HIGH (ref 0.61–1.24)
GFR calc Af Amer: 16 mL/min — ABNORMAL LOW (ref >60)
GFR, EST NON AFRICAN AMERICAN: 14 mL/min — AB (ref >60)
GLUCOSE: 124 mg/dL — AB (ref 65–99)
POTASSIUM: 5.2 mmol/L — AB (ref 3.5–5.1)
Sodium: 138 mmol/L (ref 135–145)
TOTAL PROTEIN: 5.8 g/dL — AB (ref 6.5–8.1)

## 2015-10-19 LAB — IRON AND TIBC
IRON: 38 ug/dL — AB (ref 45–182)
SATURATION RATIOS: 14 % — AB (ref 17.9–39.5)
TIBC: 279 ug/dL (ref 250–450)
UIBC: 241 ug/dL

## 2015-10-19 LAB — URIC ACID: Uric Acid, Serum: 7.1 mg/dL (ref 4.4–7.6)

## 2015-10-19 LAB — PHOSPHORUS: PHOSPHORUS: 4.2 mg/dL (ref 2.5–4.6)

## 2015-10-19 LAB — PROTEIN / CREATININE RATIO, URINE
CREATININE, URINE: 274.01 mg/dL
PROTEIN CREATININE RATIO: 0.92 mg/mg{creat} — AB (ref 0.00–0.15)
TOTAL PROTEIN, URINE: 252 mg/dL

## 2015-10-19 LAB — FERRITIN: Ferritin: 416 ng/mL — ABNORMAL HIGH (ref 24–336)

## 2015-10-19 MED ORDER — DARBEPOETIN ALFA 150 MCG/0.3ML IJ SOSY
PREFILLED_SYRINGE | INTRAMUSCULAR | Status: AC
  Filled 2015-10-19: qty 0.3

## 2015-10-19 MED ORDER — TESTOSTERONE CYPIONATE 200 MG/ML IM SOLN
INTRAMUSCULAR | Status: AC
  Filled 2015-10-19: qty 1

## 2015-10-19 MED ORDER — DARBEPOETIN ALFA 150 MCG/0.3ML IJ SOSY
150.0000 ug | PREFILLED_SYRINGE | Freq: Once | INTRAMUSCULAR | Status: AC
  Administered 2015-10-19: 150 ug via SUBCUTANEOUS

## 2015-10-19 MED ORDER — TESTOSTERONE CYPIONATE 200 MG/ML IM SOLN
100.0000 mg | INTRAMUSCULAR | Status: DC
  Administered 2015-10-19: 100 mg via INTRAMUSCULAR

## 2015-10-19 NOTE — Progress Notes (Signed)
Jeremy [name] presents today for injection per MD orders. Testosterone 0.63ml administered IM  in left upper buttock Administration without incident. Patient tolerated well.   Jeremy [name] presents today for injection per MD orders. Aranesp 170mcg  administered SQ in left Abdomen. Administration without incident. Patient tolerated well.

## 2015-10-19 NOTE — Patient Instructions (Addendum)
[place] at [facility] Discharge Instructions  RECOMMENDATIONS MADE BY THE CONSULTANT AND ANY TEST RESULTS WILL BE SENT TO YOUR REFERRING PHYSICIAN.  Return to clinic in 8 weeks to see Dr. Whitney Muse  Copy of labs given to you  Continue Aranesp and testosterone  Thank you for choosing [place] at [facility] to provide your oncology and hematology care.  To afford each patient quality time with our provider, please arrive at least 15 minutes before your scheduled appointment time.   Beginning January 23rd 2017 lab work for the [facility] will be done in the  Main lab at [facility] on 1st floor. If you have a lab appointment with the [place] please come in thru the  Main Entrance and check in at the main information desk  You need to re-schedule your appointment should you arrive 10 or more minutes late.  We strive to give you quality time with our providers, and arriving late affects you and other patients whose appointments are after yours.  Also, if you no show three or more times for appointments you may be dismissed from the clinic at the providers discretion.     Again, thank you for choosing [facility].  Our hope is that these requests will decrease the amount of time that you wait before being seen by our physicians.       _____________________________________________________________  Should you have questions after your visit to [facility], please contact our office at [phone] between the hours of 8:30 a.m. and 4:30 p.m.  Voicemails left after 4:30 p.m. will not be returned until the following business day.  For prescription refill requests, have your pharmacy contact our office.         Resources For Cancer Patients and their Caregivers ? American Cancer Society: Can assist with transportation, wigs, general needs, runs Look Good Feel Better.        941-410-7266 ? Cancer  Care: Provides financial assistance, online support groups, medication/co-pay assistance.  1-800-813-HOPE 351-250-6129) ? [place] Assists [place] cancer patients and their families through emotional , educational and financial support.  971 375 5282 ? [place] DSS Where to apply for food stamps, Medicaid and utility assistance. 518-467-5660 ? RCATS: Transportation to medical appointments. 5102681576 ? Social Security Administration: May apply for disability if have a Stage IV cancer. 516-864-9360 385-128-5240 ? [employer], Disability and Transit Services: Assists with nutrition, care and transit needs. [place] Support Programs: @10RELATIVEDAYS @ > Cancer Support Group  2nd Tuesday of the month 1pm-2pm, Journey Room  > Creative Journey  3rd Tuesday of the month 1130am-1pm, Journey Room  > Look Good Feel Better  1st Wednesday of the month 10am-12 noon, Journey Room (Call [place] to register [phone])

## 2015-10-19 NOTE — Patient Instructions (Signed)
[place] at [facility] Discharge Instructions  RECOMMENDATIONS MADE BY THE CONSULTANT AND ANY TEST RESULTS WILL BE SENT TO YOUR REFERRING PHYSICIAN.  Testosterone and aranesp injections done today. Follow up as scheduled.  Thank you for choosing [place] at [facility] to provide your oncology and hematology care.  To afford each patient quality time with our provider, please arrive at least 15 minutes before your scheduled appointment time.   Beginning January 23rd 2017 lab work for the [facility] will be done in the  Main lab at [facility] on 1st floor. If you have a lab appointment with the [place] [facility] please come in thru the  Main Entrance and check in at the main information desk  You need to re-schedule your appointment should you arrive 10 or more minutes late.  We strive to give you quality time with our providers, and arriving late affects you and other patients whose appointments are after yours.  Also, if you no show three or more times for appointments you may be dismissed from the clinic at the providers discretion.     Again, thank you for choosing [facility].  Our hope is that these requests will decrease the amount of time that you wait before being seen by our physicians.       _____________________________________________________________  Should you have questions after your visit to [facility], please contact our office at [phone] between the hours of 8:30 a.m. and 4:30 p.m.  Voicemails left after 4:30 p.m. will not be returned until the following business day.  For prescription refill requests, have your pharmacy contact our office.         Resources For Cancer Patients and their Caregivers ? American Cancer Society: Can assist with transportation, wigs, general needs, runs Look Good Feel Better.        563-142-8676 ? Cancer Care: Provides financial assistance, online  support groups, medication/co-pay assistance.  1-800-813-HOPE 413 607 5949) ? [place] Assists [place] cancer patients and their families through emotional , educational and financial support.  669-108-7184 ? [place] DSS Where to apply for food stamps, Medicaid and utility assistance. 651-283-6997 ? RCATS: Transportation to medical appointments. 660-590-1005 ? Social Security Administration: May apply for disability if have a Stage IV cancer. 807-066-4087 332-079-5816 ? [employer], Disability and Transit Services: Assists with nutrition, care and transit needs. [place] Support Programs: @10RELATIVEDAYS @ > Cancer Support Group  2nd Tuesday of the month 1pm-2pm, Journey Room  > Creative Journey  3rd Tuesday of the month 1130am-1pm, Journey Room  > Look Good Feel Better  1st Wednesday of the month 10am-12 noon, Journey Room (Call [place] to register [phone])

## 2015-10-19 NOTE — Progress Notes (Signed)
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 Moderate arterionephrosclerosis with focal features of accelerated hypertension related injury, moderate diabetic nephropathy, recurrent IgA nephropathy   HISTORY OF PRESENTING ILLNESS:  Kuron D Golson 60 y.o. male is here for follow-up of anemia secondary to CKD.  Mr. Tritz returns to the [facility] today unaccompanied.   He notes "I've been fighting this since I was 43, 60 years old," adding that he could tell his creatinine was going up. The last he heard, it was at a 3.3, and was advised that it's at a 4 today. To control his fluid accumulation, he says they put him on some more Furosemide, increasing the dose yesterday. Mr. Brunkhorst can tell it's not working because "it's just going right through me." He knows it's not pulling any of the extra fluid off because it's going straight through. In addition to this, he says the fluid is building up so badly that he's getting to the point where he can hardly breathe. His lungs sound okay during the physical exam, but the fluid pushing on his diaphragm could be causing his sensation of SOB. During the physical exam, he notes that his fluid has built up so much in his belly that he can barely bend over, and notes he can barely walk because it builds up behind his legs.  Mr. Maclellan remarks "when they did the biopsy, they found that the disease had come back in this kidney." He says he is going to call in on  Monday and see if he needs a new fluid pill.  His doctor hasn't seemed like he's had any trouble accessing his blood work here at [facility] so far, and Mr. Sokalski confirms this. He says that he likes to take the chart with him as paperwork, just in case his doctor can't find it. He confirms that it's worked out for him quite well.  He notes that, once he starts dialysis again, the transition of labs and paperwork will calm down since Dr. Lowanda Foster will do everything.   His colonoscopy 8/16 revealed diverticulosis and hemorrhoids.  He realistically has no other complaints. Appetite is marginal secondary to his kidney disease. Energy is baseline.    MEDICAL HISTORY:  Past Medical History  Diagnosis Date  . Kidney disease     Chronic kidney disease secondary to IgA nephropathy diagnosed when he was 61. 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  Marland Kitchen Hx of colonoscopy     2009 by Dr. Laural Golden. Normal except for small submucosal lipoma. No evidence of polyps or colitis.  . Hyperlipidemia   . Hypertension   . Arthritis   . Chronic anticoagulation 06/2012  . Dialysis patient [facility]) 01 01 2008  . Blood transfusion without reported diagnosis   . Cancer ([place])   . OSA (obstructive sleep apnea)   . Anemia     SURGICAL HISTORY: Past Surgical History  Procedure Laterality Date  . Dg av dialysis  shunt access exist*l* or  left arm  . Back surgery    . Coronary angioplasty with stent placement    . Kidney transplant    . Aortic valve replacement  3 /11 /2014    At [facility]    SOCIAL HISTORY: Social History   Social History  . Marital Status: Divorced    Spouse Name: N/A  . Number of Children: N/A  . Years of Education: N/A   Occupational History  . courier     [employer]   Social History Main Topics  . Smoking status: Never Smoker   . Smokeless tobacco: Not on file  . Alcohol  Use: No  . Drug Use: No  . Sexual Activity: Not on file   Other Topics Concern  . Not on file   Social History Narrative   Single   1 daughter 23    Divorced 75 y/o daughter; son killed in a car accident when he was 29, a while back 2 grandsons, both aged 17 Don't smoke, don't drink; "cuss a little bit, but other than that ..." Used to work for [employer], but transplant has cost him everything No energy, dialysis  No hobbies right now; no energy to do anything Used to like doing woodworking; furniture, cabinets, keeping busy Likes fixing and flipping cars  FAMILY HISTORY: History reviewed. No pertinent family history. indicated that his mother is deceased. He indicated that his father is alive. He reported the following about his sister: health unknown. He reported the following about his brother: unknown.   Father still living; 28-80 Not healthy, has a touch of dementia and heart issues  Sister died of kidney issues when she was very young Other than that, no one with kidney problems  Mother died with cancer; maybe pancreatic, unsure of type; aged 85-53  Brother in perfect health, aged 26  ALLERGIES:  is allergic to atorvastatin and lipitor.  MEDICATIONS:  Current Outpatient Prescriptions  Medication Sig Dispense Refill  . acetaminophen (TYLENOL) 325 MG tablet Take 650 mg by mouth every 6 (six) hours as needed.    Marland Kitchen allopurinol (ZYLOPRIM) 100 MG tablet Take 100 mg by mouth daily.    . Ascorbic Acid (VITAMIN C) 100 MG tablet Take 100 mg by mouth daily.    . B Complex Vitamins (VITAMIN-B COMPLEX) TABS Take 1 tablet by mouth.    . calcitRIOL (ROCALTROL) 0.25 MCG capsule Take 0.25 mcg by mouth. Monday - Wednesday-friday    . CALCIUM-MAGNESIUM-ZINC PO Take 1 tablet by mouth daily.    . cholecalciferol (VITAMIN D) 1000 UNITS tablet Take 1,000 Units by mouth daily.    . diphenoxylate-atropine (LOMOTIL) 2.5-0.025 MG tablet Take 1 tablet by mouth 4 (four) times daily as needed for  diarrhea or loose stools. 60 tablet 0  . folic acid-vitamin b complex-vitamin c-selenium-zinc (DIALYVITE) 3 MG TABS Take 1 tablet by mouth daily.      . furosemide (LASIX) 80 MG tablet Take 80 mg by mouth as needed.    . labetalol (NORMODYNE) 300 MG tablet Take 600 mg by mouth 3 (three) times daily.    Marland Kitchen loratadine (CLARITIN) 10 MG tablet Take 10 mg by mouth. Reported on 06/12/2015    . magnesium oxide (MAG-OX) 400 (241.3 MG) MG tablet Take 400 mg by mouth 2 (two) times daily.  1  . Multiple Vitamin (MULTI-VITAMINS) TABS Take by mouth.    . mycophenolate (MYFORTIC) 180 MG EC tablet Take 180 mg by mouth. Take 6 tablets daily    .  NIFEdipine (PROCARDIA-XL/ADALAT CC) 60 MG 24 hr tablet Take 60 mg by mouth 2 (two) times daily.     . nitroGLYCERIN (NITROSTAT) 0.4 MG SL tablet Place 1 tablet (0.4 mg total) under the tongue every 5 (five) minutes as needed for chest pain. 25 tablet 2  . Omega-3 1000 MG CAPS Take 3 g by mouth.    Marland Kitchen omeprazole (PRILOSEC) 20 MG capsule Take 20 mg by mouth daily.    . pravastatin (PRAVACHOL) 40 MG tablet     . predniSONE (DELTASONE) 5 MG tablet Take 5 mg by mouth daily.    Marland Kitchen sulfamethoxazole-trimethoprim (BACTRIM,SEPTRA) 400-80 MG per tablet Take 1 tablet by mouth 3 (three) times a week.    . tacrolimus (PROGRAF) 0.5 MG capsule Take 0.5 mg by mouth 2 (two) times daily.    . tacrolimus (PROGRAF) 0.5 MG capsule Take 0.5 mg by mouth.    . tacrolimus (PROGRAF) 1 MG capsule Take 1 mg by mouth 2 (two) times daily.     . tacrolimus (PROGRAF) 1 MG capsule Take 1 mg by mouth.    . warfarin (COUMADIN) 1 MG tablet TAKE 1-3 TABLETS BY MOUTH ONCE DAILY AS DIRECTED BY ANTICOAGULATION CLINIC. 100 tablet 2   No current facility-administered medications for this visit.    Review of Systems  Constitutional: Positive for malaise/fatigue.  HENT: Negative.   Eyes: Negative.   Respiratory: Negative.   Cardiovascular: EDEMA  Gastrointestinal: Negative.   Genitourinary: Negative.     Musculoskeletal: Negative.   Skin: Negative.   Neurological: Positive for weakness.  Endo/Heme/Allergies: Negative.   Psychiatric/Behavioral: Negative.   All other systems reviewed and are negative. 14 point ROS was done and is otherwise as detailed above or in HPI    PHYSICAL EXAMINATION: ECOG PERFORMANCE STATUS: 1 - Symptomatic but completely ambulatory  Filed Vitals:   10/19/15 1109  Pulse: 59  Temp: 97.6 F (36.4 C)  Resp: 18   Filed Weights   10/19/15 1109  Weight: 205 lb 14.4 oz (93.396 kg)    Physical Exam  Constitutional: He is oriented to person, place, and time and well-developed, well-nourished, and in no distress.  HENT:  Head: Normocephalic and atraumatic.  Nose: Nose normal.  Mouth/Throat: Oropharynx is clear and moist. No oropharyngeal exudate.  Eyes: Conjunctivae and EOM are normal. Pupils are equal, round, and reactive to light. Right eye exhibits no discharge. Left eye exhibits no discharge. No scleral icterus. periorbital edema Neck: Normal range of motion. Neck supple. No tracheal deviation present. No thyromegaly present.  Cardiovascular: Normal rate, regular rhythm, normal heart sounds and intact distal pulses.  Exam reveals no gallop and no friction rub. Right leg chronically bigger than left.  Bilateral LE edema No murmur heard. Has a mechanical heart valve.  Pulmonary/Chest: Effort normal and breath sounds normal. He has no wheezes. He has no rales.  Abdominal: Soft. Bowel sounds are normal. Mild abdominal distension noted. There is no tenderness. There is no rebound and no guarding.  Musculoskeletal: Normal range of motion. He exhibits no edema.  Lymphadenopathy:    He has no cervical adenopathy.  Neurological: He is alert and oriented to person, place, and time. He has normal reflexes. No cranial nerve deficit. Gait normal. Coordination normal.  Skin: Skin is warm and dry. No rash noted.  Psychiatric: Mood, memory, affect and judgment normal.   Nursing note and vitals reviewed.   LABORATORY DATA:  I have reviewed the data as listed Results for PRYOR, REVILLA (MRN [id number])  Ref. Range 08/10/2015 10:22 08/24/2015 11:18 09/07/2015 15:18 09/24/2015 10:47 10/05/2015 10:27  Hemoglobin Latest Ref Range: 13.0 - 17.0 g/dL 10.6 (L) 10.5 (L) 10.6 (L) 11.5 (L) 10.1 (L)   Results for ACEYN, KOHLS (MRN [id number])   Ref. Range 06/12/2015 16:00 09/07/2015 15:18  Testosterone Latest Ref Range: 348 - 1,197 ng/dL 267 (L) 560          ASSESSMENT & PLAN:  Persistent Anemia S/P Renal transplant CKD Aranesp growth factor support q 2 weeks Chronic anticoagulation secondary to mechanical St. Jude AVR Chronic Immunosuppression BMBX WNL Hypogonadism  He continues on his prior dose of aranesp at 150 mcg every 2 weeks. Adding low dose testosterone at 100 mg Q 2 weeks has helped his anemia significantly.  I advised the patient that I would not recommend changing the dose of either medication.   Unfortunately, his renal function has been decreasing. He is following closely with his physicians at [facility]. They have not had any trouble accessing labs from here.   Send colonoscopy from 8/16 at [facility] to [place] at Dr. Olevia Perches per the patient's request.   He would like copies of his bloodwork today.  I have advised him to let us know if he needs anything different in the interim but we will proceed with ongoing labs as per [facility], aranesp and low dose testosterone as ordered. RTC with me in 8 weeks.   All questions were answered. The patient knows to call the clinic with any problems, questions or concerns.  This document serves as a record of services personally performed by Ancil Linsey, MD. It was created on her behalf by Toni Amend, a trained medical scribe. The creation of this record is based on the scribe's personal observations and the provider's statements to them. This document has been checked and approved by the attending provider.  I have  reviewed the above documentation for accuracy and completeness and I agree with the above.  This note was electronically signed.  Molli Hazard, MD  10/19/2015 11:25 AM

## 2015-10-20 DIAGNOSIS — R1011 Right upper quadrant pain: Secondary | ICD-10-CM | POA: Diagnosis not present

## 2015-10-20 DIAGNOSIS — I2510 Atherosclerotic heart disease of native coronary artery without angina pectoris: Secondary | ICD-10-CM | POA: Diagnosis present

## 2015-10-20 DIAGNOSIS — J811 Chronic pulmonary edema: Secondary | ICD-10-CM | POA: Diagnosis not present

## 2015-10-20 DIAGNOSIS — Z952 Presence of prosthetic heart valve: Secondary | ICD-10-CM | POA: Diagnosis not present

## 2015-10-20 DIAGNOSIS — R180 Malignant ascites: Secondary | ICD-10-CM | POA: Diagnosis not present

## 2015-10-20 DIAGNOSIS — E785 Hyperlipidemia, unspecified: Secondary | ICD-10-CM | POA: Diagnosis not present

## 2015-10-20 DIAGNOSIS — N179 Acute kidney failure, unspecified: Secondary | ICD-10-CM | POA: Diagnosis not present

## 2015-10-20 DIAGNOSIS — J90 Pleural effusion, not elsewhere classified: Secondary | ICD-10-CM | POA: Diagnosis not present

## 2015-10-20 DIAGNOSIS — I701 Atherosclerosis of renal artery: Secondary | ICD-10-CM | POA: Diagnosis not present

## 2015-10-20 DIAGNOSIS — T8611 Kidney transplant rejection: Secondary | ICD-10-CM | POA: Diagnosis not present

## 2015-10-20 DIAGNOSIS — E1122 Type 2 diabetes mellitus with diabetic chronic kidney disease: Secondary | ICD-10-CM | POA: Diagnosis present

## 2015-10-20 DIAGNOSIS — Z940 Kidney transplant status: Secondary | ICD-10-CM | POA: Diagnosis not present

## 2015-10-20 DIAGNOSIS — E8770 Fluid overload, unspecified: Secondary | ICD-10-CM | POA: Diagnosis not present

## 2015-10-20 DIAGNOSIS — R791 Abnormal coagulation profile: Secondary | ICD-10-CM | POA: Diagnosis not present

## 2015-10-20 DIAGNOSIS — G4730 Sleep apnea, unspecified: Secondary | ICD-10-CM | POA: Diagnosis present

## 2015-10-20 DIAGNOSIS — I132 Hypertensive heart and chronic kidney disease with heart failure and with stage 5 chronic kidney disease, or end stage renal disease: Secondary | ICD-10-CM | POA: Diagnosis present

## 2015-10-20 DIAGNOSIS — Z961 Presence of intraocular lens: Secondary | ICD-10-CM | POA: Diagnosis present

## 2015-10-20 DIAGNOSIS — I4510 Unspecified right bundle-branch block: Secondary | ICD-10-CM | POA: Diagnosis not present

## 2015-10-20 DIAGNOSIS — E1121 Type 2 diabetes mellitus with diabetic nephropathy: Secondary | ICD-10-CM | POA: Diagnosis not present

## 2015-10-20 DIAGNOSIS — Z955 Presence of coronary angioplasty implant and graft: Secondary | ICD-10-CM | POA: Diagnosis not present

## 2015-10-20 DIAGNOSIS — Z9842 Cataract extraction status, left eye: Secondary | ICD-10-CM | POA: Diagnosis not present

## 2015-10-20 DIAGNOSIS — N170 Acute kidney failure with tubular necrosis: Secondary | ICD-10-CM | POA: Diagnosis not present

## 2015-10-20 DIAGNOSIS — Z9841 Cataract extraction status, right eye: Secondary | ICD-10-CM | POA: Diagnosis not present

## 2015-10-20 DIAGNOSIS — K7460 Unspecified cirrhosis of liver: Secondary | ICD-10-CM | POA: Diagnosis not present

## 2015-10-20 DIAGNOSIS — Z79899 Other long term (current) drug therapy: Secondary | ICD-10-CM | POA: Diagnosis not present

## 2015-10-20 DIAGNOSIS — I440 Atrioventricular block, first degree: Secondary | ICD-10-CM | POA: Diagnosis not present

## 2015-10-20 DIAGNOSIS — K219 Gastro-esophageal reflux disease without esophagitis: Secondary | ICD-10-CM | POA: Diagnosis not present

## 2015-10-20 DIAGNOSIS — R0602 Shortness of breath: Secondary | ICD-10-CM | POA: Diagnosis not present

## 2015-10-20 DIAGNOSIS — R188 Other ascites: Secondary | ICD-10-CM | POA: Diagnosis not present

## 2015-10-20 DIAGNOSIS — M109 Gout, unspecified: Secondary | ICD-10-CM | POA: Diagnosis not present

## 2015-10-20 DIAGNOSIS — R609 Edema, unspecified: Secondary | ICD-10-CM | POA: Diagnosis not present

## 2015-10-20 DIAGNOSIS — F329 Major depressive disorder, single episode, unspecified: Secondary | ICD-10-CM | POA: Diagnosis not present

## 2015-10-20 DIAGNOSIS — R160 Hepatomegaly, not elsewhere classified: Secondary | ICD-10-CM | POA: Diagnosis present

## 2015-10-20 DIAGNOSIS — M4800 Spinal stenosis, site unspecified: Secondary | ICD-10-CM | POA: Diagnosis present

## 2015-10-20 DIAGNOSIS — T8619 Other complication of kidney transplant: Secondary | ICD-10-CM | POA: Diagnosis not present

## 2015-10-20 DIAGNOSIS — R51 Headache: Secondary | ICD-10-CM | POA: Diagnosis not present

## 2015-10-20 DIAGNOSIS — I771 Stricture of artery: Secondary | ICD-10-CM | POA: Diagnosis not present

## 2015-10-20 DIAGNOSIS — I5032 Chronic diastolic (congestive) heart failure: Secondary | ICD-10-CM | POA: Diagnosis present

## 2015-10-20 DIAGNOSIS — K589 Irritable bowel syndrome without diarrhea: Secondary | ICD-10-CM | POA: Diagnosis present

## 2015-10-20 DIAGNOSIS — I517 Cardiomegaly: Secondary | ICD-10-CM | POA: Diagnosis not present

## 2015-10-20 DIAGNOSIS — T45515A Adverse effect of anticoagulants, initial encounter: Secondary | ICD-10-CM | POA: Diagnosis present

## 2015-10-20 DIAGNOSIS — I10 Essential (primary) hypertension: Secondary | ICD-10-CM | POA: Diagnosis not present

## 2015-10-20 DIAGNOSIS — I120 Hypertensive chronic kidney disease with stage 5 chronic kidney disease or end stage renal disease: Secondary | ICD-10-CM | POA: Diagnosis not present

## 2015-10-20 DIAGNOSIS — N186 End stage renal disease: Secondary | ICD-10-CM | POA: Diagnosis not present

## 2015-10-20 DIAGNOSIS — R162 Hepatomegaly with splenomegaly, not elsewhere classified: Secondary | ICD-10-CM | POA: Diagnosis not present

## 2015-10-20 DIAGNOSIS — Z992 Dependence on renal dialysis: Secondary | ICD-10-CM | POA: Diagnosis not present

## 2015-10-20 DIAGNOSIS — I509 Heart failure, unspecified: Secondary | ICD-10-CM | POA: Diagnosis not present

## 2015-10-20 DIAGNOSIS — M1A9XX0 Chronic gout, unspecified, without tophus (tophi): Secondary | ICD-10-CM | POA: Diagnosis not present

## 2015-10-20 LAB — VITAMIN D 25 HYDROXY (VIT D DEFICIENCY, FRACTURES): Vit D, 25-Hydroxy: 42.1 ng/mL (ref 30.0–100.0)

## 2015-10-20 LAB — PTH, INTACT AND CALCIUM
CALCIUM TOTAL (PTH): 9.1 mg/dL (ref 8.7–10.2)
PTH: 80 pg/mL — ABNORMAL HIGH (ref 15–65)

## 2015-10-22 LAB — TACROLIMUS LEVEL: Tacrolimus (FK506) - LabCorp: 4 ng/mL (ref 2.0–20.0)

## 2015-10-24 ENCOUNTER — Encounter (INDEPENDENT_AMBULATORY_CARE_PROVIDER_SITE_OTHER): Payer: Self-pay | Admitting: Internal Medicine

## 2015-10-26 LAB — POCT INR: INR: 1.6

## 2015-10-29 ENCOUNTER — Ambulatory Visit (INDEPENDENT_AMBULATORY_CARE_PROVIDER_SITE_OTHER): Payer: Self-pay | Admitting: Pharmacist

## 2015-10-29 DIAGNOSIS — Z5181 Encounter for therapeutic drug level monitoring: Secondary | ICD-10-CM

## 2015-10-29 DIAGNOSIS — Z7901 Long term (current) use of anticoagulants: Secondary | ICD-10-CM

## 2015-10-29 DIAGNOSIS — Z952 Presence of prosthetic heart valve: Secondary | ICD-10-CM

## 2015-10-29 DIAGNOSIS — Z954 Presence of other heart-valve replacement: Secondary | ICD-10-CM

## 2015-11-02 ENCOUNTER — Encounter (HOSPITAL_COMMUNITY): Payer: Medicare Other

## 2015-11-02 ENCOUNTER — Other Ambulatory Visit (HOSPITAL_COMMUNITY): Payer: Medicare Other

## 2015-11-08 ENCOUNTER — Ambulatory Visit (INDEPENDENT_AMBULATORY_CARE_PROVIDER_SITE_OTHER): Payer: Medicare Other | Admitting: Internal Medicine

## 2015-11-09 DIAGNOSIS — D689 Coagulation defect, unspecified: Secondary | ICD-10-CM | POA: Insufficient documentation

## 2015-11-09 DIAGNOSIS — R52 Pain, unspecified: Secondary | ICD-10-CM | POA: Insufficient documentation

## 2015-11-09 DIAGNOSIS — L299 Pruritus, unspecified: Secondary | ICD-10-CM | POA: Insufficient documentation

## 2015-11-09 DIAGNOSIS — R0602 Shortness of breath: Secondary | ICD-10-CM | POA: Insufficient documentation

## 2015-11-09 DIAGNOSIS — R197 Diarrhea, unspecified: Secondary | ICD-10-CM | POA: Insufficient documentation

## 2015-11-12 ENCOUNTER — Ambulatory Visit (INDEPENDENT_AMBULATORY_CARE_PROVIDER_SITE_OTHER): Payer: Self-pay | Admitting: Pharmacist

## 2015-11-12 DIAGNOSIS — Z954 Presence of other heart-valve replacement: Secondary | ICD-10-CM

## 2015-11-12 DIAGNOSIS — Z7901 Long term (current) use of anticoagulants: Secondary | ICD-10-CM

## 2015-11-12 DIAGNOSIS — Z5181 Encounter for therapeutic drug level monitoring: Secondary | ICD-10-CM

## 2015-11-12 DIAGNOSIS — Z952 Presence of prosthetic heart valve: Secondary | ICD-10-CM

## 2015-11-12 LAB — POCT INR: INR: 1.8

## 2015-11-14 ENCOUNTER — Ambulatory Visit (INDEPENDENT_AMBULATORY_CARE_PROVIDER_SITE_OTHER): Payer: Medicare Other | Admitting: Internal Medicine

## 2015-11-14 ENCOUNTER — Encounter (INDEPENDENT_AMBULATORY_CARE_PROVIDER_SITE_OTHER): Payer: Self-pay | Admitting: Internal Medicine

## 2015-11-14 VITALS — BP 164/58 | HR 72 | Temp 98.0°F | Ht 71.0 in | Wt 175.1 lb

## 2015-11-14 DIAGNOSIS — R197 Diarrhea, unspecified: Secondary | ICD-10-CM | POA: Diagnosis not present

## 2015-11-14 MED ORDER — DICYCLOMINE HCL 10 MG PO CAPS: 20.0000 mg | ORAL_CAPSULE | Freq: Three times a day (TID) | ORAL | 4 refills | Status: DC

## 2015-11-14 MED ORDER — DICYCLOMINE HCL 10 MG PO CAPS: 10.0000 mg | ORAL_CAPSULE | Freq: Three times a day (TID) | ORAL | 4 refills | Status: DC

## 2015-11-14 NOTE — Progress Notes (Signed)
Subjective:    Patient ID: Jeremy [name], male    DOB: 05-27-1955, 60 y.o.   MRN: [id number]  HPIReferred by Dr. Wendi Snipes for chronic diarrhea.  Hx of same. He was last seen by our office in 2011. Has tried FiberCon and dicycylcomine in the past. He present today with c/o that for months he has diarrhea. When he eats foods, it will go straight thru him. He does not have severe stomach pains. He has had diarrhea for 4-5 months. He is having 2-3 stools a day. He says he stopped the Dicyclomine. He was taking Dicyclomine twice a day.  His appetite is okay. No weight loss.  Hx of CKD secondary to IGa nephropathy diagnosed when he was 26. 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  Kidney  transplant in September 2014 at [facility] He tells me his kidney are  failing. Starting dialysis in 3 weeks at [facility]. No recent antibiotics. Recently discharge from [facility] (yesterday) for fluid overload. He received dialysis.  Started dialysis 3 weeks ago.  T-Thur-Sat.  His last colonoscopy was in September, 2016 at [facility] and was normal. (per patient. Colonoscopy, 2009 by Dr. Laural Golden: Normal except for small submucosal lipoma. No evidence of polyps or colitis.  Hepatitis B and C markers negative per records  CBC    Component Value Date/Time   WBC 3.4 (L) 10/19/2015 0942   RBC 3.54 (L) 10/19/2015 0942   HGB 9.8 (L) 10/19/2015 0942   HCT 32.1 (L) 10/19/2015 0942   HCT 32.2 (L) 08/30/2014 1013   PLT 92 (L) 10/19/2015 0942   PLT 125 (L) 08/30/2014 1013   MCV 90.7 10/19/2015 0942   MCV 88.2 09/16/2014 1041   MCV 89 08/30/2014 1013   MCH 27.7 10/19/2015 0942   MCHC 30.5 10/19/2015 0942   RDW 15.7 (H) 10/19/2015 0942   RDW 14.0 08/30/2014 1013   LYMPHSABS 0.7 10/19/2015 0942   LYMPHSABS 0.8 08/30/2014 1013   MONOABS 0.3 10/19/2015 0942   EOSABS 0.2 10/19/2015 0942   EOSABS 0.1 08/30/2014 1013   BASOSABS 0.0 10/19/2015 0942   BASOSABS 0.0 08/30/2014 1013     Review of Systems Past Medical History:  Diagnosis Date  . Anemia   . Arthritis   . Blood transfusion without reported diagnosis   . Cancer ([place])   . Chronic anticoagulation 06/2012  . Dialysis patient [facility]) 01 01 2008  . Hx of colonoscopy    2009 by Dr. Laural Golden. Normal except for small submucosal lipoma. No evidence of polyps or colitis.  . Hyperlipidemia   . Hypertension   . Kidney disease    Chronic kidney disease secondary to IgA nephropathy diagnosed when he was 11. 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  Marland Kitchen OSA (obstructive sleep apnea)     Past Surgical History:  Procedure Laterality Date  . AORTIC VALVE REPLACEMENT  3 /11 /2014   At [place]    . CORONARY ANGIOPLASTY WITH STENT PLACEMENT    . DG AV DIALYSIS  SHUNT ACCESS EXIST*L* OR     left arm  . KIDNEY TRANSPLANT      Allergies  Allergen Reactions  . Atorvastatin Other (See Comments)    Muscle Aches  . Lipitor [Atorvastatin Calcium]     Body aches     Current Outpatient Prescriptions on File Prior  to Visit  Medication Sig Dispense Refill  . acetaminophen (TYLENOL) 325 MG tablet Take 650 mg by mouth every 6 (six) hours as needed.    Marland Kitchen allopurinol (ZYLOPRIM) 100 MG tablet Take 100 mg by mouth daily.    . B Complex Vitamins (VITAMIN-B COMPLEX) TABS Take 1 tablet by mouth.    . diphenoxylate-atropine (LOMOTIL) 2.5-0.025 MG tablet Take 1 tablet by mouth 4 (four) times daily as needed for diarrhea or loose stools. 60 tablet 0  . folic acid-vitamin b complex-vitamin c-selenium-zinc (DIALYVITE) 3 MG TABS Take 1 tablet by mouth daily.      Marland Kitchen labetalol (NORMODYNE) 300 MG tablet Take 600 mg by mouth 3 (three) times daily.    Marland Kitchen loratadine (CLARITIN) 10 MG tablet Take 10 mg by mouth. Reported on 06/12/2015    . magnesium oxide (MAG-OX) 400 (241.3 MG) MG tablet Take  400 mg by mouth 2 (two) times daily.  1  . mycophenolate (MYFORTIC) 180 MG EC tablet Take 180 mg by mouth. Take 6 tablets daily    . NIFEdipine (PROCARDIA-XL/ADALAT CC) 60 MG 24 hr tablet Take 90 mg by mouth 2 (two) times daily.     . nitroGLYCERIN (NITROSTAT) 0.4 MG SL tablet Place 1 tablet (0.4 mg total) under the tongue every 5 (five) minutes as needed for chest pain. 25 tablet 2  . Omega-3 1000 MG CAPS Take 3 g by mouth.    Marland Kitchen omeprazole (PRILOSEC) 20 MG capsule Take 20 mg by mouth daily.    . pravastatin (PRAVACHOL) 40 MG tablet     . predniSONE (DELTASONE) 5 MG tablet Take 5 mg by mouth daily.    Marland Kitchen sulfamethoxazole-trimethoprim (BACTRIM,SEPTRA) 400-80 MG per tablet Take 1 tablet by mouth 3 (three) times a week.    . tacrolimus (PROGRAF) 0.5 MG capsule Take 0.5 mg by mouth 2 (two) times daily.    . tacrolimus (PROGRAF) 1 MG capsule Take 1 mg by mouth.    . warfarin (COUMADIN) 1 MG tablet TAKE 1-3 TABLETS BY MOUTH ONCE DAILY AS DIRECTED BY ANTICOAGULATION CLINIC. (Patient taking differently: Takes 2 tabs every other day and 3 tabs rest of time (1mg  per tab).) 100 tablet 2   No current facility-administered medications on file prior to visit.        Objective:   Physical Exam Blood pressure (!) 164/58, pulse 72, temperature 98 F (36.7 C), height 5\' 11"  (1.803 m), weight 175 lb 1.6 oz (79.4 kg).  Alert and oriented. Skin warm and dry. Oral mucosa is moist.   . Sclera anicteric, conjunctivae is pink. Thyroid not enlarged. No cervical lymphadenopathy. Lungs clear. Heart regular rate and rhythm.  Abdomen is soft. Bowel sounds are positive. No hepatomegaly. No abdominal masses felt. No tenderness.  No edema to lower extremities.         Assessment & Plan:  Chronic diarrhea. Hx of same.  Will get stool studies. Dicyclomine 20mg  TID. OV pending.

## 2015-11-14 NOTE — Patient Instructions (Signed)
GI pathogen.  Dicyclomine TID.

## 2015-11-15 ENCOUNTER — Encounter: Payer: Medicare Other | Admitting: Pharmacist

## 2015-11-15 ENCOUNTER — Ambulatory Visit (INDEPENDENT_AMBULATORY_CARE_PROVIDER_SITE_OTHER): Payer: Self-pay | Admitting: Pharmacist

## 2015-11-15 DIAGNOSIS — D631 Anemia in chronic kidney disease: Secondary | ICD-10-CM | POA: Diagnosis not present

## 2015-11-15 DIAGNOSIS — Z992 Dependence on renal dialysis: Secondary | ICD-10-CM | POA: Diagnosis not present

## 2015-11-15 DIAGNOSIS — Z952 Presence of prosthetic heart valve: Secondary | ICD-10-CM

## 2015-11-15 DIAGNOSIS — Z7901 Long term (current) use of anticoagulants: Secondary | ICD-10-CM

## 2015-11-15 DIAGNOSIS — Z954 Presence of other heart-valve replacement: Secondary | ICD-10-CM

## 2015-11-15 DIAGNOSIS — Z5181 Encounter for therapeutic drug level monitoring: Secondary | ICD-10-CM

## 2015-11-15 DIAGNOSIS — Z23 Encounter for immunization: Secondary | ICD-10-CM | POA: Diagnosis not present

## 2015-11-15 DIAGNOSIS — N186 End stage renal disease: Secondary | ICD-10-CM | POA: Diagnosis not present

## 2015-11-15 LAB — POCT INR: INR: 2.4

## 2015-11-16 ENCOUNTER — Encounter (HOSPITAL_COMMUNITY): Payer: Medicare Other

## 2015-11-16 ENCOUNTER — Encounter (HOSPITAL_COMMUNITY): Payer: Medicare Other | Attending: Oncology

## 2015-11-16 VITALS — BP 164/57 | HR 70 | Temp 98.0°F | Resp 18

## 2015-11-16 DIAGNOSIS — C649 Malignant neoplasm of unspecified kidney, except renal pelvis: Secondary | ICD-10-CM | POA: Insufficient documentation

## 2015-11-16 DIAGNOSIS — N183 Chronic kidney disease, stage 3 unspecified: Secondary | ICD-10-CM

## 2015-11-16 DIAGNOSIS — N189 Chronic kidney disease, unspecified: Principal | ICD-10-CM

## 2015-11-16 DIAGNOSIS — D631 Anemia in chronic kidney disease: Secondary | ICD-10-CM

## 2015-11-16 DIAGNOSIS — E291 Testicular hypofunction: Secondary | ICD-10-CM | POA: Diagnosis not present

## 2015-11-16 LAB — CBC WITH DIFFERENTIAL/PLATELET
BASOS PCT: 0 %
Basophils Absolute: 0 10*3/uL (ref 0.0–0.1)
Eosinophils Absolute: 0 10*3/uL (ref 0.0–0.7)
Eosinophils Relative: 2 %
HCT: 32.5 % — ABNORMAL LOW (ref 39.0–52.0)
HEMOGLOBIN: 10.1 g/dL — AB (ref 13.0–17.0)
LYMPHS ABS: 0.6 10*3/uL — AB (ref 0.7–4.0)
LYMPHS PCT: 32 %
MCH: 29.5 pg (ref 26.0–34.0)
MCHC: 31.1 g/dL (ref 30.0–36.0)
MCV: 95 fL (ref 78.0–100.0)
MONOS PCT: 18 %
Monocytes Absolute: 0.3 10*3/uL (ref 0.1–1.0)
NEUTROS ABS: 0.9 10*3/uL — AB (ref 1.7–7.7)
NEUTROS PCT: 48 %
Platelets: 105 10*3/uL — ABNORMAL LOW (ref 150–400)
RBC: 3.42 MIL/uL — ABNORMAL LOW (ref 4.22–5.81)
RDW: 16.3 % — ABNORMAL HIGH (ref 11.5–15.5)
WBC: 1.9 10*3/uL — ABNORMAL LOW (ref 4.0–10.5)

## 2015-11-16 LAB — COMPREHENSIVE METABOLIC PANEL
ALK PHOS: 143 U/L — AB (ref 38–126)
ALT: 24 U/L (ref 17–63)
ANION GAP: 8 (ref 5–15)
AST: 34 U/L (ref 15–41)
Albumin: 3.5 g/dL (ref 3.5–5.0)
BILIRUBIN TOTAL: 0.5 mg/dL (ref 0.3–1.2)
BUN: 45 mg/dL — ABNORMAL HIGH (ref 6–20)
CALCIUM: 9.2 mg/dL (ref 8.9–10.3)
CO2: 26 mmol/L (ref 22–32)
Chloride: 107 mmol/L (ref 101–111)
Creatinine, Ser: 3.5 mg/dL — ABNORMAL HIGH (ref 0.61–1.24)
GFR calc non Af Amer: 18 mL/min — ABNORMAL LOW (ref >60)
GFR, EST AFRICAN AMERICAN: 21 mL/min — AB (ref >60)
Glucose, Bld: 133 mg/dL — ABNORMAL HIGH (ref 65–99)
Potassium: 3.6 mmol/L (ref 3.5–5.1)
SODIUM: 141 mmol/L (ref 135–145)
TOTAL PROTEIN: 6 g/dL — AB (ref 6.5–8.1)

## 2015-11-16 LAB — URIC ACID: Uric Acid, Serum: 3.7 mg/dL — ABNORMAL LOW (ref 4.4–7.6)

## 2015-11-16 LAB — IRON AND TIBC
IRON: 41 ug/dL — AB (ref 45–182)
Saturation Ratios: 15 % — ABNORMAL LOW (ref 17.9–39.5)
TIBC: 266 ug/dL (ref 250–450)
UIBC: 225 ug/dL

## 2015-11-16 LAB — FERRITIN: Ferritin: 321 ng/mL (ref 24–336)

## 2015-11-16 LAB — MAGNESIUM: Magnesium: 1.6 mg/dL — ABNORMAL LOW (ref 1.7–2.4)

## 2015-11-16 MED ORDER — DARBEPOETIN ALFA 150 MCG/0.3ML IJ SOSY
150.0000 ug | PREFILLED_SYRINGE | Freq: Once | INTRAMUSCULAR | Status: AC
  Administered 2015-11-16: 150 ug via SUBCUTANEOUS

## 2015-11-16 MED ORDER — TESTOSTERONE CYPIONATE 200 MG/ML IM SOLN
INTRAMUSCULAR | Status: AC
  Filled 2015-11-16: qty 1

## 2015-11-16 MED ORDER — TESTOSTERONE CYPIONATE 200 MG/ML IM SOLN
100.0000 mg | INTRAMUSCULAR | Status: DC
  Administered 2015-11-16: 100 mg via INTRAMUSCULAR

## 2015-11-16 MED ORDER — DARBEPOETIN ALFA 150 MCG/0.3ML IJ SOSY
PREFILLED_SYRINGE | INTRAMUSCULAR | Status: AC
  Filled 2015-11-16: qty 0.3

## 2015-11-16 NOTE — Patient Instructions (Signed)
[place] at [facility] Discharge Instructions  RECOMMENDATIONS MADE BY THE CONSULTANT AND ANY TEST RESULTS WILL BE SENT TO YOUR REFERRING PHYSICIAN.  Testosterone and Aranesp today.    Thank you for choosing [place] at [facility] to provide your oncology and hematology care.  To afford each patient quality time with our provider, please arrive at least 15 minutes before your scheduled appointment time.   Beginning January 23rd 2017 lab work for the [facility] will be done in the  Main lab at [facility] on 1st floor. If you have a lab appointment with the [facility] please come in thru the  Main Entrance and check in at the main information desk  You need to re-schedule your appointment should you arrive 10 or more minutes late.  We strive to give you quality time with our providers, and arriving late affects you and other patients whose appointments are after yours.  Also, if you no show three or more times for appointments you may be dismissed from the clinic at the providers discretion.     Again, thank you for choosing [facility].  Our hope is that these requests will decrease the amount of time that you wait before being seen by our physicians.       _____________________________________________________________  Should you have questions after your visit to [facility], please contact our office at [phone] between the hours of 8:30 a.m. and 4:30 p.m.  Voicemails left after 4:30 p.m. will not be returned until the following business day.  For prescription refill requests, have your pharmacy contact our office.         Resources For Cancer Patients and their Caregivers ? American Cancer Society: Can assist with transportation, wigs, general needs, runs Look Good Feel Better.        (586) 579-1022 ? Cancer Care: Provides financial assistance, online support groups, medication/co-pay assistance.   1-800-813-HOPE (580)822-4470) ? [facility] Assists [place] cancer patients and their families through emotional , educational and financial support.  210-177-9235 ? [place] DSS Where to apply for food stamps, Medicaid and utility assistance. 201-702-0596 ? [facility]: Transportation to medical appointments. (773)867-6122 ? Social Security Administration: May apply for disability if have a Stage IV cancer. 604-140-4867 (740)068-9004 ? [employer], Disability and Transit Services: Assists with nutrition, care and transit needs. [place] Support Programs: @10RELATIVEDAYS @ > Cancer Support Group  2nd Tuesday of the month 1pm-2pm, Journey Room  > Creative Journey  3rd Tuesday of the month 1130am-1pm, Journey Room  > Look Good Feel Better  1st Wednesday of the month 10am-12 noon, Journey Room (Call [place] to register [phone])

## 2015-11-16 NOTE — Progress Notes (Signed)
Jeremy [name] presents today for injection per MD orders. Aranesp 140mcg administered SQ in left Abdomen.  Testosterone 100mg  given IM in left gluteal.   Administration without incident. Patient tolerated well.

## 2015-11-17 DIAGNOSIS — Z23 Encounter for immunization: Secondary | ICD-10-CM | POA: Diagnosis not present

## 2015-11-17 DIAGNOSIS — Z992 Dependence on renal dialysis: Secondary | ICD-10-CM | POA: Diagnosis not present

## 2015-11-17 DIAGNOSIS — N186 End stage renal disease: Secondary | ICD-10-CM | POA: Diagnosis not present

## 2015-11-17 DIAGNOSIS — D631 Anemia in chronic kidney disease: Secondary | ICD-10-CM | POA: Diagnosis not present

## 2015-11-17 LAB — VITAMIN D 25 HYDROXY (VIT D DEFICIENCY, FRACTURES): VIT D 25 HYDROXY: 50.2 ng/mL (ref 30.0–100.0)

## 2015-11-17 LAB — PTH, INTACT AND CALCIUM
CALCIUM TOTAL (PTH): 9.3 mg/dL (ref 8.7–10.2)
PTH: 80 pg/mL — AB (ref 15–65)

## 2015-11-18 ENCOUNTER — Encounter (HOSPITAL_COMMUNITY): Payer: Self-pay | Admitting: Hematology & Oncology

## 2015-11-18 LAB — TACROLIMUS LEVEL: Tacrolimus (FK506) - LabCorp: 6.4 ng/mL (ref 2.0–20.0)

## 2015-11-20 DIAGNOSIS — Z23 Encounter for immunization: Secondary | ICD-10-CM | POA: Diagnosis not present

## 2015-11-20 DIAGNOSIS — Z992 Dependence on renal dialysis: Secondary | ICD-10-CM | POA: Diagnosis not present

## 2015-11-20 DIAGNOSIS — D631 Anemia in chronic kidney disease: Secondary | ICD-10-CM | POA: Diagnosis not present

## 2015-11-20 DIAGNOSIS — N186 End stage renal disease: Secondary | ICD-10-CM | POA: Diagnosis not present

## 2015-11-21 DIAGNOSIS — R809 Proteinuria, unspecified: Secondary | ICD-10-CM | POA: Diagnosis not present

## 2015-11-21 DIAGNOSIS — Z955 Presence of coronary angioplasty implant and graft: Secondary | ICD-10-CM | POA: Diagnosis not present

## 2015-11-21 DIAGNOSIS — D899 Disorder involving the immune mechanism, unspecified: Secondary | ICD-10-CM | POA: Diagnosis not present

## 2015-11-21 DIAGNOSIS — M461 Sacroiliitis, not elsewhere classified: Secondary | ICD-10-CM | POA: Diagnosis not present

## 2015-11-21 DIAGNOSIS — I2510 Atherosclerotic heart disease of native coronary artery without angina pectoris: Secondary | ICD-10-CM | POA: Diagnosis not present

## 2015-11-21 DIAGNOSIS — Z888 Allergy status to other drugs, medicaments and biological substances status: Secondary | ICD-10-CM | POA: Diagnosis not present

## 2015-11-21 DIAGNOSIS — E8770 Fluid overload, unspecified: Secondary | ICD-10-CM | POA: Diagnosis not present

## 2015-11-21 DIAGNOSIS — Z9861 Coronary angioplasty status: Secondary | ICD-10-CM | POA: Diagnosis not present

## 2015-11-21 DIAGNOSIS — M25511 Pain in right shoulder: Secondary | ICD-10-CM | POA: Diagnosis not present

## 2015-11-21 DIAGNOSIS — D631 Anemia in chronic kidney disease: Secondary | ICD-10-CM | POA: Diagnosis not present

## 2015-11-21 DIAGNOSIS — C649 Malignant neoplasm of unspecified kidney, except renal pelvis: Secondary | ICD-10-CM | POA: Diagnosis not present

## 2015-11-21 DIAGNOSIS — I5030 Unspecified diastolic (congestive) heart failure: Secondary | ICD-10-CM | POA: Diagnosis not present

## 2015-11-21 DIAGNOSIS — E782 Mixed hyperlipidemia: Secondary | ICD-10-CM | POA: Diagnosis not present

## 2015-11-21 DIAGNOSIS — N184 Chronic kidney disease, stage 4 (severe): Secondary | ICD-10-CM | POA: Diagnosis not present

## 2015-11-21 DIAGNOSIS — Z79899 Other long term (current) drug therapy: Secondary | ICD-10-CM | POA: Diagnosis not present

## 2015-11-21 DIAGNOSIS — Z959 Presence of cardiac and vascular implant and graft, unspecified: Secondary | ICD-10-CM | POA: Diagnosis not present

## 2015-11-21 DIAGNOSIS — I120 Hypertensive chronic kidney disease with stage 5 chronic kidney disease or end stage renal disease: Secondary | ICD-10-CM | POA: Diagnosis not present

## 2015-11-21 DIAGNOSIS — E785 Hyperlipidemia, unspecified: Secondary | ICD-10-CM | POA: Diagnosis not present

## 2015-11-21 DIAGNOSIS — Z4822 Encounter for aftercare following kidney transplant: Secondary | ICD-10-CM | POA: Diagnosis not present

## 2015-11-21 DIAGNOSIS — Z940 Kidney transplant status: Secondary | ICD-10-CM | POA: Diagnosis not present

## 2015-11-21 DIAGNOSIS — M4800 Spinal stenosis, site unspecified: Secondary | ICD-10-CM | POA: Diagnosis not present

## 2015-11-21 DIAGNOSIS — N183 Chronic kidney disease, stage 3 (moderate): Secondary | ICD-10-CM | POA: Diagnosis not present

## 2015-11-21 DIAGNOSIS — Z7901 Long term (current) use of anticoagulants: Secondary | ICD-10-CM | POA: Diagnosis not present

## 2015-11-21 DIAGNOSIS — Z992 Dependence on renal dialysis: Secondary | ICD-10-CM | POA: Diagnosis not present

## 2015-11-21 DIAGNOSIS — N186 End stage renal disease: Secondary | ICD-10-CM | POA: Diagnosis not present

## 2015-11-22 DIAGNOSIS — D631 Anemia in chronic kidney disease: Secondary | ICD-10-CM | POA: Diagnosis not present

## 2015-11-22 DIAGNOSIS — Z992 Dependence on renal dialysis: Secondary | ICD-10-CM | POA: Diagnosis not present

## 2015-11-22 DIAGNOSIS — N186 End stage renal disease: Secondary | ICD-10-CM | POA: Diagnosis not present

## 2015-11-22 DIAGNOSIS — Z23 Encounter for immunization: Secondary | ICD-10-CM | POA: Diagnosis not present

## 2015-11-23 ENCOUNTER — Ambulatory Visit (INDEPENDENT_AMBULATORY_CARE_PROVIDER_SITE_OTHER): Payer: Medicare Other | Admitting: Family Medicine

## 2015-11-23 ENCOUNTER — Encounter: Payer: Self-pay | Admitting: Family Medicine

## 2015-11-23 VITALS — BP 149/55 | HR 70 | Temp 97.6°F | Ht 71.0 in | Wt 175.1 lb

## 2015-11-23 DIAGNOSIS — Z952 Presence of prosthetic heart valve: Secondary | ICD-10-CM

## 2015-11-23 DIAGNOSIS — F32A Depression, unspecified: Secondary | ICD-10-CM

## 2015-11-23 DIAGNOSIS — Z7901 Long term (current) use of anticoagulants: Secondary | ICD-10-CM

## 2015-11-23 DIAGNOSIS — Z5181 Encounter for therapeutic drug level monitoring: Secondary | ICD-10-CM | POA: Diagnosis not present

## 2015-11-23 DIAGNOSIS — F329 Major depressive disorder, single episode, unspecified: Secondary | ICD-10-CM

## 2015-11-23 DIAGNOSIS — Z954 Presence of other heart-valve replacement: Secondary | ICD-10-CM

## 2015-11-23 DIAGNOSIS — M10471 Other secondary gout, right ankle and foot: Secondary | ICD-10-CM | POA: Diagnosis not present

## 2015-11-23 LAB — POCT INR: INR: 2.5

## 2015-11-23 LAB — COAGUCHEK XS/INR WAIVED
INR: 2.5 — AB (ref 0.9–1.1)
PROTHROMBIN TIME: 30.1 s

## 2015-11-23 NOTE — Progress Notes (Signed)
   HPI  Patient present [facility] with worsening renal function. He ha s a Hx of IgA nephropathy s/p transplant. He was admitted, had a Bx without explanation, and has now started HD in [place] going T, Th, S.   He feels much better since started HD, he is planning to transition to PD. He and his traansplant surgeon have made the decision to titrate off of anti-rejection meds.   He states he has had some feelings of depression, he had transient passive suicidal thoughts stating that he felt like he would be "better off dead". However he denies any suicidal thoughts or suicidal ideation today. He also states that he would not hurt himself.  He denied are checked today, he denies any sources of bleeding and has good medication compliance with Coumadin.  PMH: Smoking status noted ROS: Per HPI  Objective: BP (!) 149/55   Pulse 70   Temp 97.6 F (36.4 C) (Oral)   Ht 5\' 11"  (1.803 m)   Wt 175 lb 2 oz (79.4 kg)   BMI 24.42 kg/m  Gen: NAD, alert, cooperative with exam HEENT: NCAT CV: RRR, good S1/S2, no murmur Resp: CTABL, no wheezes, non-labored Ext: No edema, warm Neuro: Alert and oriented, No gross deficits  Skin: Right posterior superior iliac spine with resolving bruise at the site of the previous kidney biopsy. Left groin with resolving bruise   Left arm with large torturous vessel extending from the antecubital fossa to distal lower arm  Assessment and plan:  Mr. brain is a very pleasant 60 year old male with history of IgA nephropathy status post her kidney transplant and subsequent failure of that transplant. He is doing better now that he is back on dialysis and plans to transition to peritoneal dialysis as soon as he can. He has been working closely with his nephrologist and renal transplant doctor both based at [facility] recently. He has stopped mycophenolate yesterday after discussion with his transplant physician.  # Depression Patient certainly  with understandable depression recently, he states that he did have passive thoughts of being better off dead when he was in the worst of the illness, however today he states that he feels much better after starting HD. He denies suicidal thoughts today. Do not believe he warrants pharmacologic treatment at this time.  # Chronic anticoagulation, goal of an INR 2-3, prosthetic heart valve INR is 2.5 today, continue current dose  # Gout Acute flare in the right third finger in the distal DIP, slowly resolving Continue to monitor     Laroy Apple, MD [facility] 11/23/2015, 4:29 PM

## 2015-11-23 NOTE — Patient Instructions (Signed)
Great to see you!  I would love to keep taking care of you but if you would like someone closer to home Thersa Salt is at [facility] in [place] and would do a great job for you.

## 2015-11-24 DIAGNOSIS — Z23 Encounter for immunization: Secondary | ICD-10-CM | POA: Diagnosis not present

## 2015-11-24 DIAGNOSIS — D631 Anemia in chronic kidney disease: Secondary | ICD-10-CM | POA: Diagnosis not present

## 2015-11-24 DIAGNOSIS — Z992 Dependence on renal dialysis: Secondary | ICD-10-CM | POA: Diagnosis not present

## 2015-11-24 DIAGNOSIS — N186 End stage renal disease: Secondary | ICD-10-CM | POA: Diagnosis not present

## 2015-11-26 DIAGNOSIS — Z992 Dependence on renal dialysis: Secondary | ICD-10-CM | POA: Diagnosis not present

## 2015-11-26 DIAGNOSIS — D631 Anemia in chronic kidney disease: Secondary | ICD-10-CM | POA: Diagnosis not present

## 2015-11-26 DIAGNOSIS — N186 End stage renal disease: Secondary | ICD-10-CM | POA: Diagnosis not present

## 2015-11-26 DIAGNOSIS — Z23 Encounter for immunization: Secondary | ICD-10-CM | POA: Diagnosis not present

## 2015-11-27 ENCOUNTER — Ambulatory Visit: Payer: Self-pay | Admitting: Pharmacist

## 2015-11-27 DIAGNOSIS — N186 End stage renal disease: Secondary | ICD-10-CM | POA: Diagnosis not present

## 2015-11-27 DIAGNOSIS — Z4822 Encounter for aftercare following kidney transplant: Secondary | ICD-10-CM | POA: Diagnosis not present

## 2015-11-27 DIAGNOSIS — Z940 Kidney transplant status: Secondary | ICD-10-CM | POA: Diagnosis not present

## 2015-11-27 DIAGNOSIS — R161 Splenomegaly, not elsewhere classified: Secondary | ICD-10-CM | POA: Diagnosis not present

## 2015-11-27 DIAGNOSIS — Z905 Acquired absence of kidney: Secondary | ICD-10-CM | POA: Diagnosis not present

## 2015-11-27 DIAGNOSIS — D899 Disorder involving the immune mechanism, unspecified: Secondary | ICD-10-CM | POA: Diagnosis not present

## 2015-11-27 DIAGNOSIS — Z7901 Long term (current) use of anticoagulants: Secondary | ICD-10-CM

## 2015-11-27 DIAGNOSIS — R601 Generalized edema: Secondary | ICD-10-CM | POA: Diagnosis not present

## 2015-11-27 DIAGNOSIS — Z951 Presence of aortocoronary bypass graft: Secondary | ICD-10-CM | POA: Diagnosis not present

## 2015-11-27 DIAGNOSIS — T8612 Kidney transplant failure: Secondary | ICD-10-CM | POA: Diagnosis not present

## 2015-11-27 DIAGNOSIS — Z5181 Encounter for therapeutic drug level monitoring: Secondary | ICD-10-CM

## 2015-11-27 DIAGNOSIS — Z85528 Personal history of other malignant neoplasm of kidney: Secondary | ICD-10-CM | POA: Diagnosis not present

## 2015-11-27 DIAGNOSIS — E041 Nontoxic single thyroid nodule: Secondary | ICD-10-CM | POA: Diagnosis not present

## 2015-11-27 DIAGNOSIS — Z992 Dependence on renal dialysis: Secondary | ICD-10-CM | POA: Diagnosis not present

## 2015-11-27 DIAGNOSIS — Z8739 Personal history of other diseases of the musculoskeletal system and connective tissue: Secondary | ICD-10-CM | POA: Diagnosis not present

## 2015-11-27 DIAGNOSIS — Z952 Presence of prosthetic heart valve: Secondary | ICD-10-CM

## 2015-11-29 DIAGNOSIS — N186 End stage renal disease: Secondary | ICD-10-CM | POA: Diagnosis not present

## 2015-11-29 DIAGNOSIS — Z992 Dependence on renal dialysis: Secondary | ICD-10-CM | POA: Diagnosis not present

## 2015-11-29 DIAGNOSIS — Z23 Encounter for immunization: Secondary | ICD-10-CM | POA: Diagnosis not present

## 2015-11-29 DIAGNOSIS — D631 Anemia in chronic kidney disease: Secondary | ICD-10-CM | POA: Diagnosis not present

## 2015-11-30 ENCOUNTER — Other Ambulatory Visit (HOSPITAL_COMMUNITY): Payer: Self-pay | Admitting: Oncology

## 2015-11-30 ENCOUNTER — Encounter (HOSPITAL_BASED_OUTPATIENT_CLINIC_OR_DEPARTMENT_OTHER): Payer: Medicare Other

## 2015-11-30 ENCOUNTER — Encounter (HOSPITAL_COMMUNITY): Payer: Self-pay

## 2015-11-30 ENCOUNTER — Encounter (HOSPITAL_COMMUNITY): Payer: Medicare Other

## 2015-11-30 VITALS — BP 128/57 | HR 63 | Resp 16

## 2015-11-30 DIAGNOSIS — N183 Chronic kidney disease, stage 3 unspecified: Secondary | ICD-10-CM

## 2015-11-30 DIAGNOSIS — D631 Anemia in chronic kidney disease: Secondary | ICD-10-CM

## 2015-11-30 DIAGNOSIS — C649 Malignant neoplasm of unspecified kidney, except renal pelvis: Secondary | ICD-10-CM | POA: Diagnosis not present

## 2015-11-30 DIAGNOSIS — N189 Chronic kidney disease, unspecified: Principal | ICD-10-CM

## 2015-11-30 LAB — CBC WITH DIFFERENTIAL/PLATELET
Basophils Absolute: 0 K/uL (ref 0.0–0.1)
Basophils Relative: 1 %
Eosinophils Absolute: 0.2 K/uL (ref 0.0–0.7)
Eosinophils Relative: 10 %
HCT: 34.8 % — ABNORMAL LOW (ref 39.0–52.0)
Hemoglobin: 10.6 g/dL — ABNORMAL LOW (ref 13.0–17.0)
Lymphocytes Relative: 46 %
Lymphs Abs: 1 K/uL (ref 0.7–4.0)
MCH: 29 pg (ref 26.0–34.0)
MCHC: 30.5 g/dL (ref 30.0–36.0)
MCV: 95.1 fL (ref 78.0–100.0)
Monocytes Absolute: 0.4 K/uL (ref 0.1–1.0)
Monocytes Relative: 18 %
Neutro Abs: 0.6 K/uL — ABNORMAL LOW (ref 1.7–7.7)
Neutrophils Relative %: 25 %
Platelets: 114 K/uL — ABNORMAL LOW (ref 150–400)
RBC: 3.66 MIL/uL — ABNORMAL LOW (ref 4.22–5.81)
RDW: 15.4 % (ref 11.5–15.5)
WBC: 2.2 K/uL — ABNORMAL LOW (ref 4.0–10.5)

## 2015-11-30 MED ORDER — TESTOSTERONE CYPIONATE 200 MG/ML IM SOLN: 100.0000 mg | INTRAMUSCULAR | Status: DC

## 2015-11-30 MED ORDER — DARBEPOETIN ALFA 150 MCG/0.3ML IJ SOSY
PREFILLED_SYRINGE | INTRAMUSCULAR | Status: AC
  Filled 2015-11-30: qty 0.3

## 2015-11-30 MED ORDER — DARBEPOETIN ALFA 150 MCG/0.3ML IJ SOSY
150.0000 ug | PREFILLED_SYRINGE | Freq: Once | INTRAMUSCULAR | Status: AC
  Administered 2015-11-30: 150 ug via SUBCUTANEOUS

## 2015-11-30 NOTE — Progress Notes (Signed)
Jeremy [name] presents today for injection per MD orders. Aranesp 150mcg administered SQ in left Abdomen. Administration without incident. Patient tolerated well.  

## 2015-11-30 NOTE — Patient Instructions (Signed)
[facility] at [facility] Discharge Instructions  RECOMMENDATIONS MADE BY THE CONSULTANT AND ANY TEST RESULTS WILL BE SENT TO YOUR REFERRING PHYSICIAN.  Aranesp given today  Follow up as scheduled  Thank you for choosing [facility] at [facility] to provide your oncology and hematology care.  To afford each patient quality time with our provider, please arrive at least 15 minutes before your scheduled appointment time.   Beginning January 23rd 2017 lab work for the Cancer Center will be done in the  Main lab at [facility] on 1st floor. If you have a lab appointment with the Cancer Center please come in thru the  Main Entrance and check in at the main information desk  You need to re-schedule your appointment should you arrive 10 or more minutes late.  We strive to give you quality time with our providers, and arriving late affects you and other patients whose appointments are after yours.  Also, if you no show three or more times for appointments you may be dismissed from the clinic at the providers discretion.     Again, thank you for choosing [facility].  Our hope is that these requests will decrease the amount of time that you wait before being seen by our physicians.       _____________________________________________________________  Should you have questions after your visit to [facility], please contact our office at [phone] between the hours of 8:30 a.m. and 4:30 p.m.  Voicemails left after 4:30 p.m. will not be returned until the following business day.  For prescription refill requests, have your pharmacy contact our office.         Resources For Cancer Patients and their Caregivers ? American Cancer Society: Can assist with transportation, wigs, general needs, runs Look Good Feel Better.        1-888-227-6333 ? Cancer Care: Provides financial assistance, online support groups, medication/co-pay  assistance.  1-800-813-HOPE (4673) ? [facility] Assists [place] cancer patients and their families through emotional , educational and financial support.  336-427-4357 ? [place] DSS Where to apply for food stamps, Medicaid and utility assistance. 336-342-1394 ? RCATS: Transportation to medical appointments. 336-347-2287 ? Social Security Administration: May apply for disability if have a Stage IV cancer. 336-342-7796 1-800-772-1213 ? [place] Aging, Disability and Transit Services: Assists with nutrition, care and transit needs. 336-349-2343  Cancer Center Support Programs: @10RELATIVEDAYS@ > Cancer Support Group  2nd Tuesday of the month 1pm-2pm, Journey Room  > Creative Journey  3rd Tuesday of the month 1130am-1pm, Journey Room  > Look Good Feel Better  1st Wednesday of the month 10am-12 noon, Journey Room (Call American Cancer Society to register 1-800-395-5775)    

## 2015-12-01 DIAGNOSIS — N186 End stage renal disease: Secondary | ICD-10-CM | POA: Diagnosis not present

## 2015-12-01 DIAGNOSIS — D631 Anemia in chronic kidney disease: Secondary | ICD-10-CM | POA: Diagnosis not present

## 2015-12-01 DIAGNOSIS — Z992 Dependence on renal dialysis: Secondary | ICD-10-CM | POA: Diagnosis not present

## 2015-12-01 DIAGNOSIS — Z23 Encounter for immunization: Secondary | ICD-10-CM | POA: Diagnosis not present

## 2015-12-01 LAB — POCT INR: INR: 2.5

## 2015-12-03 ENCOUNTER — Ambulatory Visit (INDEPENDENT_AMBULATORY_CARE_PROVIDER_SITE_OTHER): Payer: Self-pay | Admitting: Pharmacist

## 2015-12-03 DIAGNOSIS — Z952 Presence of prosthetic heart valve: Secondary | ICD-10-CM

## 2015-12-03 DIAGNOSIS — Z7901 Long term (current) use of anticoagulants: Secondary | ICD-10-CM

## 2015-12-03 DIAGNOSIS — Z954 Presence of other heart-valve replacement: Secondary | ICD-10-CM

## 2015-12-03 DIAGNOSIS — Z5181 Encounter for therapeutic drug level monitoring: Secondary | ICD-10-CM

## 2015-12-04 DIAGNOSIS — Z992 Dependence on renal dialysis: Secondary | ICD-10-CM | POA: Diagnosis not present

## 2015-12-04 DIAGNOSIS — D631 Anemia in chronic kidney disease: Secondary | ICD-10-CM | POA: Diagnosis not present

## 2015-12-04 DIAGNOSIS — N186 End stage renal disease: Secondary | ICD-10-CM | POA: Diagnosis not present

## 2015-12-04 DIAGNOSIS — Z23 Encounter for immunization: Secondary | ICD-10-CM | POA: Diagnosis not present

## 2015-12-06 DIAGNOSIS — N186 End stage renal disease: Secondary | ICD-10-CM | POA: Diagnosis not present

## 2015-12-06 DIAGNOSIS — Z992 Dependence on renal dialysis: Secondary | ICD-10-CM | POA: Diagnosis not present

## 2015-12-06 DIAGNOSIS — T8610 Unspecified complication of kidney transplant: Secondary | ICD-10-CM | POA: Diagnosis not present

## 2015-12-06 DIAGNOSIS — D631 Anemia in chronic kidney disease: Secondary | ICD-10-CM | POA: Diagnosis not present

## 2015-12-06 DIAGNOSIS — Z23 Encounter for immunization: Secondary | ICD-10-CM | POA: Diagnosis not present

## 2015-12-08 DIAGNOSIS — D631 Anemia in chronic kidney disease: Secondary | ICD-10-CM | POA: Diagnosis not present

## 2015-12-08 DIAGNOSIS — N2581 Secondary hyperparathyroidism of renal origin: Secondary | ICD-10-CM | POA: Diagnosis not present

## 2015-12-08 DIAGNOSIS — Z23 Encounter for immunization: Secondary | ICD-10-CM | POA: Diagnosis not present

## 2015-12-08 DIAGNOSIS — D509 Iron deficiency anemia, unspecified: Secondary | ICD-10-CM | POA: Diagnosis not present

## 2015-12-08 DIAGNOSIS — N186 End stage renal disease: Secondary | ICD-10-CM | POA: Diagnosis not present

## 2015-12-09 LAB — POCT INR: INR: 2.5

## 2015-12-11 ENCOUNTER — Ambulatory Visit (INDEPENDENT_AMBULATORY_CARE_PROVIDER_SITE_OTHER): Payer: 59 | Admitting: Pharmacist

## 2015-12-11 DIAGNOSIS — Z5181 Encounter for therapeutic drug level monitoring: Secondary | ICD-10-CM | POA: Diagnosis not present

## 2015-12-11 DIAGNOSIS — Z952 Presence of prosthetic heart valve: Secondary | ICD-10-CM

## 2015-12-11 DIAGNOSIS — Z954 Presence of other heart-valve replacement: Secondary | ICD-10-CM

## 2015-12-11 DIAGNOSIS — D631 Anemia in chronic kidney disease: Secondary | ICD-10-CM | POA: Diagnosis not present

## 2015-12-11 DIAGNOSIS — Z23 Encounter for immunization: Secondary | ICD-10-CM | POA: Diagnosis not present

## 2015-12-11 DIAGNOSIS — N186 End stage renal disease: Secondary | ICD-10-CM | POA: Diagnosis not present

## 2015-12-11 DIAGNOSIS — D509 Iron deficiency anemia, unspecified: Secondary | ICD-10-CM | POA: Diagnosis not present

## 2015-12-11 DIAGNOSIS — Z7901 Long term (current) use of anticoagulants: Secondary | ICD-10-CM | POA: Diagnosis not present

## 2015-12-11 DIAGNOSIS — N2581 Secondary hyperparathyroidism of renal origin: Secondary | ICD-10-CM | POA: Diagnosis not present

## 2015-12-11 NOTE — Progress Notes (Signed)
Patient ID: Jeremy [name], male   DOB: Jul 24, 1955, 60 y.o.   MRN: [id number]  Patient checks INR at home with Home INR monitoring.  Billing once per month interupertation fee.  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

## 2015-12-12 LAB — POCT INR: INR: 2.9

## 2015-12-13 ENCOUNTER — Ambulatory Visit: Payer: Self-pay | Admitting: Pharmacist

## 2015-12-13 DIAGNOSIS — Z5181 Encounter for therapeutic drug level monitoring: Secondary | ICD-10-CM

## 2015-12-13 DIAGNOSIS — Z23 Encounter for immunization: Secondary | ICD-10-CM | POA: Diagnosis not present

## 2015-12-13 DIAGNOSIS — N2581 Secondary hyperparathyroidism of renal origin: Secondary | ICD-10-CM | POA: Diagnosis not present

## 2015-12-13 DIAGNOSIS — Z952 Presence of prosthetic heart valve: Secondary | ICD-10-CM

## 2015-12-13 DIAGNOSIS — D509 Iron deficiency anemia, unspecified: Secondary | ICD-10-CM | POA: Diagnosis not present

## 2015-12-13 DIAGNOSIS — Z7901 Long term (current) use of anticoagulants: Secondary | ICD-10-CM

## 2015-12-13 DIAGNOSIS — N186 End stage renal disease: Secondary | ICD-10-CM | POA: Diagnosis not present

## 2015-12-13 DIAGNOSIS — D631 Anemia in chronic kidney disease: Secondary | ICD-10-CM | POA: Diagnosis not present

## 2015-12-14 ENCOUNTER — Encounter (HOSPITAL_COMMUNITY): Payer: Medicare Other

## 2015-12-14 ENCOUNTER — Encounter (HOSPITAL_COMMUNITY): Payer: Medicare Other | Attending: Hematology & Oncology

## 2015-12-14 ENCOUNTER — Encounter (HOSPITAL_COMMUNITY): Payer: Medicare Other | Attending: Hematology & Oncology | Admitting: Hematology & Oncology

## 2015-12-14 ENCOUNTER — Encounter (HOSPITAL_COMMUNITY): Payer: Self-pay | Admitting: Hematology & Oncology

## 2015-12-14 VITALS — BP 145/53 | HR 62 | Temp 98.2°F | Resp 18 | Wt 174.6 lb

## 2015-12-14 DIAGNOSIS — Z940 Kidney transplant status: Secondary | ICD-10-CM | POA: Diagnosis not present

## 2015-12-14 DIAGNOSIS — N183 Chronic kidney disease, stage 3 unspecified: Secondary | ICD-10-CM

## 2015-12-14 DIAGNOSIS — C649 Malignant neoplasm of unspecified kidney, except renal pelvis: Secondary | ICD-10-CM

## 2015-12-14 DIAGNOSIS — D631 Anemia in chronic kidney disease: Secondary | ICD-10-CM

## 2015-12-14 DIAGNOSIS — N189 Chronic kidney disease, unspecified: Principal | ICD-10-CM

## 2015-12-14 DIAGNOSIS — E291 Testicular hypofunction: Secondary | ICD-10-CM | POA: Diagnosis not present

## 2015-12-14 DIAGNOSIS — Z7901 Long term (current) use of anticoagulants: Secondary | ICD-10-CM | POA: Diagnosis not present

## 2015-12-14 DIAGNOSIS — Z992 Dependence on renal dialysis: Secondary | ICD-10-CM | POA: Diagnosis not present

## 2015-12-14 DIAGNOSIS — N186 End stage renal disease: Secondary | ICD-10-CM

## 2015-12-14 DIAGNOSIS — D899 Disorder involving the immune mechanism, unspecified: Secondary | ICD-10-CM

## 2015-12-14 DIAGNOSIS — I509 Heart failure, unspecified: Secondary | ICD-10-CM | POA: Insufficient documentation

## 2015-12-14 DIAGNOSIS — D849 Immunodeficiency, unspecified: Secondary | ICD-10-CM

## 2015-12-14 LAB — CBC WITH DIFFERENTIAL/PLATELET
BASOS ABS: 0 10*3/uL (ref 0.0–0.1)
Basophils Relative: 0 %
EOS PCT: 1 %
Eosinophils Absolute: 0 10*3/uL (ref 0.0–0.7)
HCT: 33.7 % — ABNORMAL LOW (ref 39.0–52.0)
Hemoglobin: 10.4 g/dL — ABNORMAL LOW (ref 13.0–17.0)
LYMPHS PCT: 29 %
Lymphs Abs: 1.7 10*3/uL (ref 0.7–4.0)
MCH: 29.4 pg (ref 26.0–34.0)
MCHC: 30.9 g/dL (ref 30.0–36.0)
MCV: 95.2 fL (ref 78.0–100.0)
MONO ABS: 0.5 10*3/uL (ref 0.1–1.0)
MONOS PCT: 9 %
Neutro Abs: 3.5 10*3/uL (ref 1.7–7.7)
Neutrophils Relative %: 61 %
PLATELETS: 112 10*3/uL — AB (ref 150–400)
RBC: 3.54 MIL/uL — ABNORMAL LOW (ref 4.22–5.81)
RDW: 14.9 % (ref 11.5–15.5)
WBC: 5.8 10*3/uL (ref 4.0–10.5)

## 2015-12-14 LAB — COMPREHENSIVE METABOLIC PANEL
ALT: 43 U/L (ref 17–63)
ANION GAP: 6 (ref 5–15)
AST: 47 U/L — AB (ref 15–41)
Albumin: 3.5 g/dL (ref 3.5–5.0)
Alkaline Phosphatase: 211 U/L — ABNORMAL HIGH (ref 38–126)
BUN: 58 mg/dL — ABNORMAL HIGH (ref 6–20)
CHLORIDE: 107 mmol/L (ref 101–111)
CO2: 24 mmol/L (ref 22–32)
Calcium: 8.8 mg/dL — ABNORMAL LOW (ref 8.9–10.3)
Creatinine, Ser: 3.97 mg/dL — ABNORMAL HIGH (ref 0.61–1.24)
GFR, EST AFRICAN AMERICAN: 18 mL/min — AB (ref >60)
GFR, EST NON AFRICAN AMERICAN: 15 mL/min — AB (ref >60)
Glucose, Bld: 101 mg/dL — ABNORMAL HIGH (ref 65–99)
POTASSIUM: 3.3 mmol/L — AB (ref 3.5–5.1)
Sodium: 137 mmol/L (ref 135–145)
TOTAL PROTEIN: 6.5 g/dL (ref 6.5–8.1)
Total Bilirubin: 0.4 mg/dL (ref 0.3–1.2)

## 2015-12-14 LAB — IRON AND TIBC
IRON: 205 ug/dL — AB (ref 45–182)
SATURATION RATIOS: 87 % — AB (ref 17.9–39.5)
TIBC: 237 ug/dL — AB (ref 250–450)
UIBC: 32 ug/dL

## 2015-12-14 LAB — URIC ACID: Uric Acid, Serum: 3.4 mg/dL — ABNORMAL LOW (ref 4.4–7.6)

## 2015-12-14 LAB — FERRITIN: Ferritin: 877 ng/mL — ABNORMAL HIGH (ref 24–336)

## 2015-12-14 LAB — PHOSPHORUS: PHOSPHORUS: 3.8 mg/dL (ref 2.5–4.6)

## 2015-12-14 LAB — MAGNESIUM: MAGNESIUM: 1.7 mg/dL (ref 1.7–2.4)

## 2015-12-14 MED ORDER — DARBEPOETIN ALFA 150 MCG/0.3ML IJ SOSY
PREFILLED_SYRINGE | INTRAMUSCULAR | Status: AC
  Filled 2015-12-14: qty 0.3

## 2015-12-14 MED ORDER — DARBEPOETIN ALFA 150 MCG/0.3ML IJ SOSY
150.0000 ug | PREFILLED_SYRINGE | Freq: Once | INTRAMUSCULAR | Status: AC
  Administered 2015-12-14: 150 ug via SUBCUTANEOUS

## 2015-12-14 NOTE — Progress Notes (Signed)
Patient not to have aranesp today because he is back on hemodialysis and receiving procrit from them.

## 2015-12-14 NOTE — Progress Notes (Signed)
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 Moderate arterionephrosclerosis with focal features of accelerated hypertension related injury, moderate diabetic nephropathy, recurrent IgA nephropathy Dialysis  HISTORY OF PRESENTING ILLNESS:  Jeremy [name] 59 y.o. male is here for follow-up of anemia secondary to CKD.  Jeremy [name] is unaccompanied.  States he went back on dialysis the Saturday after his last visit. He has not been able to get his blood adjusted right. The physician at the kidney center said his kidney is done. He receives dialysis in [place]. He is interested in peritoneal dialysis. He notes he has received multiple doses of iron during transfusion.   When asked how he is doing with his fluid, patient states "I feel like a different person, I feel like a new person". He feels good now so he wants to do a lot. His appetite is good, "It's like crazy".  He continues to make urine, though "very little". He urinates once or twice daily.  He has a cardiology appointment next Tuesday. He has realistically no complaints today. He is here for ongoing management of his anemia.   MEDICAL HISTORY:  Past Medical History:  Diagnosis Date  . Anemia   . Arthritis   . Blood transfusion without reported diagnosis   . Cancer ([place])   . Chronic anticoagulation 06/2012  . Dialysis patient [facility]) 01 01 2008  . Hx of colonoscopy    2009 by Dr. Laural Golden. Normal except for small submucosal  lipoma. No evidence of polyps or colitis.  . Hyperlipidemia   . Hypertension   . Kidney disease    Chronic kidney disease secondary to IgA nephropathy diagnosed when he was 62. 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  Marland Kitchen OSA (obstructive sleep apnea)     SURGICAL HISTORY: Past Surgical History:  Procedure Laterality Date  . AORTIC VALVE REPLACEMENT  3 /11 /2014   At [facility]    . CORONARY ANGIOPLASTY WITH STENT PLACEMENT    . DG AV DIALYSIS  SHUNT ACCESS EXIST*L* OR     left arm  . KIDNEY TRANSPLANT      SOCIAL HISTORY: Social History   Social History  . Marital status: Divorced    Spouse name: N/A  . Number of children: N/A  . Years of education: N/A   Occupational History  . courier     [employer]   Social History Main Topics  . Smoking status: Never Smoker  . Smokeless tobacco: Never Used  . Alcohol use No  . Drug use: No  . Sexual activity: Not on file   Other Topics Concern  . Not on file   Social History Narrative   Single   1 daughter 56    Divorced 71 y/o daughter; son killed in a car accident when he was 48, a while back 2 grandsons, both aged 46 Don't smoke, don't drink; "cuss a little bit, but other than that ..." Used to work for [employer], but  transplant has cost him everything No energy, dialysis  No hobbies right now; no energy to do anything Used to like doing woodworking; furniture, cabinets, keeping busy Likes fixing and flipping cars  FAMILY HISTORY: History reviewed. No pertinent family history. indicated that his mother is deceased. He indicated that his father is alive. He reported the following about his sister: health unknown.He reported the following about his brother: unknown.He indicated that his daughter is alive. He indicated that his other is alive.   Father still living; 79-80 Not healthy, has a  touch of dementia and heart issues  Sister died of kidney issues when she was very young Other than that, no one with kidney problems  Mother died with cancer; maybe pancreatic, unsure of type; aged 60-53  Brother in perfect health, aged 20  ALLERGIES:  is allergic to atorvastatin; lipitor [atorvastatin calcium]; and sertraline.  MEDICATIONS:  Current Outpatient Prescriptions  Medication Sig Dispense Refill  . acetaminophen (TYLENOL) 325 MG tablet Take 650 mg by mouth every 6 (six) hours as needed.    Marland Kitchen allopurinol (ZYLOPRIM) 100 MG tablet Take 200 mg by mouth daily.     . B Complex Vitamins (VITAMIN-B COMPLEX) TABS Take 1 tablet by mouth.    . dicyclomine (BENTYL) 10 MG capsule Take 2 capsules (20 mg total) by mouth 3 (three) times daily before meals. 90 capsule 4  . folic acid-vitamin b complex-vitamin c-selenium-zinc (DIALYVITE) 3 MG TABS Take 1 tablet by mouth daily.      Marland Kitchen labetalol (NORMODYNE) 300 MG tablet Take 600 mg by mouth 3 (three) times daily.    . magnesium oxide (MAG-OX) 400 (241.3 MG) MG tablet Take 400 mg by mouth 2 (two) times daily.  1  . NIFEdipine (ADALAT CC) 90 MG 24 hr tablet Take 90 mg by mouth 2 (two) times daily.     . nitroGLYCERIN (NITROSTAT) 0.4 MG SL tablet Place 1 tablet (0.4 mg total) under the tongue every 5 (five) minutes as needed for chest pain. 25 tablet 2  . Omega-3 1000 MG CAPS Take 3 g by mouth.    Marland Kitchen omeprazole (PRILOSEC) 20 MG capsule Take 20 mg by mouth daily.    . pravastatin (PRAVACHOL) 40 MG tablet     . predniSONE (DELTASONE) 5 MG tablet Take 5 mg by mouth daily.    Marland Kitchen sulfamethoxazole-trimethoprim (BACTRIM,SEPTRA) 400-80 MG per tablet Take 1 tablet by mouth 3 (three) times a week.    . tacrolimus (PROGRAF) 1 MG capsule Take 1 mg by mouth 3 (three) times daily.     Marland Kitchen warfarin (COUMADIN) 1 MG tablet TAKE 1-3 TABLETS BY MOUTH ONCE DAILY AS DIRECTED BY ANTICOAGULATION CLINIC. (Patient taking differently: Takes 2 tabs every other day and 3 tabs  rest of time (19m per tab).) 100 tablet 2  . diphenoxylate-atropine (LOMOTIL) 2.5-0.025 MG tablet Take 1 tablet by mouth 4 (four) times daily as needed for diarrhea or loose stools. (Patient not taking: Reported on 12/14/2015) 60 tablet 0  . loratadine (CLARITIN) 10 MG tablet Take 10 mg by mouth. Reported on 06/12/2015     No current facility-administered medications for this visit.     Review of Systems  Constitutional: Negative.  HENT: Negative.   Eyes: Negative.   Respiratory: Negative.   Cardiovascular: Negative.  Gastrointestinal: Negative.   Genitourinary: Negative.   Musculoskeletal: Negative.   Skin: Negative.   Neurological: Negative.  Endo/Heme/Allergies: Negative.   Psychiatric/Behavioral: Negative.   All other systems reviewed and are negative. 14 point  ROS was done and is otherwise as detailed above or in HPI    PHYSICAL EXAMINATION: ECOG PERFORMANCE STATUS: 1 - Symptomatic but completely ambulatory Vitals with BMI 12/14/2015  Height   Weight 174 lbs 10 oz  BMI   Systolic 810  Diastolic 53  Pulse 62  Respirations 18    Physical Exam  Constitutional: He is oriented to person, place, and time and well-developed, well-nourished, and in no distress.  HENT:  Head: Normocephalic and atraumatic.  Nose: Nose normal.  Mouth/Throat: Oropharynx is clear and moist. No oropharyngeal exudate.  Eyes: Conjunctivae and EOM are normal. Pupils are equal, round, and reactive to light. Right eye exhibits no discharge. Left eye exhibits no discharge. No scleral icterus. periorbital edema Neck: Normal range of motion. Neck supple. No tracheal deviation present. No thyromegaly present.  Cardiovascular: Normal rate, regular rhythm, normal heart sounds and intact distal pulses.  Exam reveals no gallop and no friction rub. Right leg chronically bigger than left.  Bilateral LE edema markedly improved, trace at ankles No murmur heard. Has a mechanical heart valve.  Pulmonary/Chest: Effort  normal and breath sounds normal. He has no wheezes. He has no rales.  Abdominal: Soft. Bowel sounds are normal. Mild abdominal distension noted. There is no tenderness. There is no rebound and no guarding.  Musculoskeletal: Normal range of motion. He exhibits no edema.  Lymphadenopathy:    He has no cervical adenopathy.  Neurological: He is alert and oriented to person, place, and time. He has normal reflexes. No cranial nerve deficit. Gait normal. Coordination normal.  Skin: Skin is warm and dry. No rash noted.  Psychiatric: Mood, memory, affect and judgment normal.  Nursing note and vitals reviewed.   LABORATORY DATA:  I have reviewed the data as listed  Results for ABU, HEAVIN (MRN [id number]) as of 12/15/2015 19:01  Ref. Range 12/14/2015 10:59  Sodium Latest Ref Range: 135 - 145 mmol/L 137  Potassium Latest Ref Range: 3.5 - 5.1 mmol/L 3.3 (L)  Chloride Latest Ref Range: 101 - 111 mmol/L 107  CO2 Latest Ref Range: 22 - 32 mmol/L 24  BUN Latest Ref Range: 6 - 20 mg/dL 58 (H)  Creatinine Latest Ref Range: 0.61 - 1.24 mg/dL 3.97 (H)  Calcium Latest Ref Range: 8.9 - 10.3 mg/dL 8.8 (L)  EGFR (Non-African Amer.) Latest Ref Range: >60 mL/min 15 (L)  EGFR (African American) Latest Ref Range: >60 mL/min 18 (L)  Glucose Latest Ref Range: 65 - 99 mg/dL 101 (H)  Anion gap Latest Ref Range: 5 - 15  6  Calcium, Total (PTH) Latest Ref Range: 8.7 - 10.2 mg/dL 9.2  Phosphorus Latest Ref Range: 2.5 - 4.6 mg/dL 3.8  Magnesium Latest Ref Range: 1.7 - 2.4 mg/dL 1.7  Alkaline Phosphatase Latest Ref Range: 38 - 126 U/L 211 (H)  Albumin Latest Ref Range: 3.5 - 5.0 g/dL 3.5  Uric Acid, Serum Latest Ref Range: 4.4 - 7.6 mg/dL 3.4 (L)  AST Latest Ref Range: 15 - 41 U/L 47 (H)  ALT Latest Ref Range: 17 - 63 U/L 43  Total Protein Latest Ref Range: 6.5 - 8.1 g/dL 6.5  Total Bilirubin Latest Ref Range: 0.3 - 1.2 mg/dL 0.4  Iron Latest Ref Range: 45 - 182 ug/dL 205 (H)  UIBC Latest Units: ug/dL 32  TIBC Latest  Ref Range: 250 - 450 ug/dL 237 (L)  Saturation Ratios Latest Ref Range: 17.9 - 39.5 % 87 (H)  Ferritin Latest Ref Range: 24 - 336 ng/mL  877 (H)  Vitamin D, 25-Hydroxy Latest Ref Range: 30.0 - 100.0 ng/mL 48.8  WBC Latest Ref Range: 4.0 - 10.5 K/uL 5.8  RBC Latest Ref Range: 4.22 - 5.81 MIL/uL 3.54 (L)  Hemoglobin Latest Ref Range: 13.0 - 17.0 g/dL 10.4 (L)  HCT Latest Ref Range: 39.0 - 52.0 % 33.7 (L)  MCV Latest Ref Range: 78.0 - 100.0 fL 95.2  MCH Latest Ref Range: 26.0 - 34.0 pg 29.4  MCHC Latest Ref Range: 30.0 - 36.0 g/dL 30.9  RDW Latest Ref Range: 11.5 - 15.5 % 14.9  Platelets Latest Ref Range: 150 - 400 K/uL 112 (L)  Neutrophils Latest Units: % 61  Lymphocytes Latest Units: % 29  Monocytes Relative Latest Units: % 9  Eosinophil Latest Units: % 1  Basophil Latest Units: % 0  NEUT# Latest Ref Range: 1.7 - 7.7 K/uL 3.5  Lymphocyte # Latest Ref Range: 0.7 - 4.0 K/uL 1.7  Monocyte # Latest Ref Range: 0.1 - 1.0 K/uL 0.5  Eosinophils Absolute Latest Ref Range: 0.0 - 0.7 K/uL 0.0  Basophils Absolute Latest Ref Range: 0.0 - 0.1 K/uL 0.0  PTH Latest Ref Range: 15 - 65 pg/mL 281 (H)         ASSESSMENT & PLAN:  Persistent Anemia S/P Renal transplant CKD Aranesp growth factor support q 2 weeks Chronic anticoagulation secondary to mechanical St. Jude AVR Chronic Immunosuppression BMBX WNL Hypogonadism Dialysis  He continues on his prior dose of aranesp at 150 mcg every 2 weeks. Adding low dose testosterone at 100 mg Q 2 weeks has helped his anemia significantly.  I advised the patient that I would not recommend changing the dose of either medication. HOWEVER, now that he is on dialysis, I advised him that usually his nephrologist will manage his anemia. He would like to discuss this with his nephrologist next week. I advised him that usually we discontinue aranesp once a patient starts dialysis.   He was given our patient navigator's contact information today and will touch base  with [name] after his visit next week  He is thinking about transferring his ongoing follow-up regarding his RCC here as well. I advised him to let us know and I would be glad to accomodate him.   RTC 8 weeks. Continue with all prior labs as ordered.   All questions were answered. The patient knows to call the clinic with any problems, questions or concerns.  This document serves as a record of services personally performed by Ancil Linsey, MD. It was created on her behalf by Arlyce Harman, a trained medical scribe. The creation of this record is based on the scribe's personal observations and the provider's statements to them. This document has been checked and approved by the attending provider.  I have reviewed the above documentation for accuracy and completeness and I agree with the above.  This note was electronically signed.  Molli Hazard, MD  12/14/2015 11:00 AM

## 2015-12-14 NOTE — Patient Instructions (Addendum)
[place] at [facility] Discharge Instructions  RECOMMENDATIONS MADE BY THE CONSULTANT AND ANY TEST RESULTS WILL BE SENT TO YOUR REFERRING PHYSICIAN.  You saw Dr. Whitney Muse today.  Return to clinic in 8 weeks  For now - continue with labs, testosterone, and Aranesp  Utilize Jennifer's # if needed - card given to you.   Thank you for choosing [place] at [facility] to provide your oncology and hematology care.  To afford each patient quality time with our provider, please arrive at least 15 minutes before your scheduled appointment time.   Beginning January 23rd 2017 lab work for the [facility] will be done in the  Main lab at [facility] on 1st floor. If you have a lab appointment with the [place] please come in thru the  Main Entrance and check in at the main information desk  You need to re-schedule your appointment should you arrive 10 or more minutes late.  We strive to give you quality time with our providers, and arriving late affects you and other patients whose appointments are after yours.  Also, if you no show three or more times for appointments you may be dismissed from the clinic at the providers discretion.     Again, thank you for choosing [facility].  Our hope is that these requests will decrease the amount of time that you wait before being seen by our physicians.       _____________________________________________________________  Should you have questions after your visit to [facility], please contact our office at [phone] between the hours of 8:30 a.m. and 4:30 p.m.  Voicemails left after 4:30 p.m. will not be returned until the following business day.  For prescription refill requests, have your pharmacy contact our office.         Resources For Cancer Patients and their Caregivers ? American Cancer Society: Can assist with transportation, wigs, general needs, runs Look  Good Feel Better.        6186301206 ? Cancer Care: Provides financial assistance, online support groups, medication/co-pay assistance.  1-800-813-HOPE (754) 304-0233) ? [employer] Assists [place] cancer patients and their families through emotional , educational and financial support.  [phone] ? [place] DSS Where to apply for food stamps, Medicaid and utility assistance. 2121677400 ? RCATS: Transportation to medical appointments. (347)095-9129 ? Social Security Administration: May apply for disability if have a Stage IV cancer. [phone] ? [employer], Disability and Transit Services: Assists with nutrition, care and transit needs. [place] Support Programs: @10RELATIVEDAYS @ > Cancer Support Group  2nd Tuesday of the month 1pm-2pm, Journey Room  > Creative Journey  3rd Tuesday of the month 1130am-1pm, Journey Room  > Look Good Feel Better  1st Wednesday of the month 10am-12 noon, Journey Room (Call [place] to register [phone])

## 2015-12-14 NOTE — Progress Notes (Signed)
Jeremy [name] presents today for injection per MD orders. Aranesp 192mcg administered SQ in left Abdomen. Administration without incident. Patient tolerated well.

## 2015-12-14 NOTE — Addendum Note (Signed)
Addended by: Josephina Shih A on: 12/14/2015 11:40 AM   Modules accepted: Orders

## 2015-12-15 ENCOUNTER — Encounter (HOSPITAL_COMMUNITY): Payer: Self-pay | Admitting: Hematology & Oncology

## 2015-12-15 DIAGNOSIS — N186 End stage renal disease: Secondary | ICD-10-CM | POA: Diagnosis not present

## 2015-12-15 DIAGNOSIS — N2581 Secondary hyperparathyroidism of renal origin: Secondary | ICD-10-CM | POA: Diagnosis not present

## 2015-12-15 DIAGNOSIS — D509 Iron deficiency anemia, unspecified: Secondary | ICD-10-CM | POA: Diagnosis not present

## 2015-12-15 DIAGNOSIS — D631 Anemia in chronic kidney disease: Secondary | ICD-10-CM | POA: Diagnosis not present

## 2015-12-15 DIAGNOSIS — Z23 Encounter for immunization: Secondary | ICD-10-CM | POA: Diagnosis not present

## 2015-12-15 LAB — PTH, INTACT AND CALCIUM
CALCIUM TOTAL (PTH): 9.2 mg/dL (ref 8.7–10.2)
PTH: 281 pg/mL — AB (ref 15–65)

## 2015-12-15 LAB — VITAMIN D 25 HYDROXY (VIT D DEFICIENCY, FRACTURES): Vit D, 25-Hydroxy: 48.8 ng/mL (ref 30.0–100.0)

## 2015-12-17 DIAGNOSIS — D509 Iron deficiency anemia, unspecified: Secondary | ICD-10-CM | POA: Diagnosis not present

## 2015-12-17 DIAGNOSIS — Z23 Encounter for immunization: Secondary | ICD-10-CM | POA: Diagnosis not present

## 2015-12-17 DIAGNOSIS — D631 Anemia in chronic kidney disease: Secondary | ICD-10-CM | POA: Diagnosis not present

## 2015-12-17 DIAGNOSIS — N2581 Secondary hyperparathyroidism of renal origin: Secondary | ICD-10-CM | POA: Diagnosis not present

## 2015-12-17 DIAGNOSIS — N186 End stage renal disease: Secondary | ICD-10-CM | POA: Diagnosis not present

## 2015-12-17 LAB — TACROLIMUS LEVEL: Tacrolimus (FK506) - LabCorp: 7.6 ng/mL (ref 2.0–20.0)

## 2015-12-18 DIAGNOSIS — Z7901 Long term (current) use of anticoagulants: Secondary | ICD-10-CM | POA: Diagnosis not present

## 2015-12-18 DIAGNOSIS — I350 Nonrheumatic aortic (valve) stenosis: Secondary | ICD-10-CM | POA: Diagnosis not present

## 2015-12-18 DIAGNOSIS — N186 End stage renal disease: Secondary | ICD-10-CM | POA: Diagnosis not present

## 2015-12-18 DIAGNOSIS — E785 Hyperlipidemia, unspecified: Secondary | ICD-10-CM | POA: Diagnosis not present

## 2015-12-18 DIAGNOSIS — R0600 Dyspnea, unspecified: Secondary | ICD-10-CM | POA: Diagnosis not present

## 2015-12-18 DIAGNOSIS — I4510 Unspecified right bundle-branch block: Secondary | ICD-10-CM | POA: Diagnosis not present

## 2015-12-18 DIAGNOSIS — Z7952 Long term (current) use of systemic steroids: Secondary | ICD-10-CM | POA: Diagnosis not present

## 2015-12-18 DIAGNOSIS — R0609 Other forms of dyspnea: Secondary | ICD-10-CM | POA: Diagnosis not present

## 2015-12-18 DIAGNOSIS — E1122 Type 2 diabetes mellitus with diabetic chronic kidney disease: Secondary | ICD-10-CM | POA: Diagnosis not present

## 2015-12-18 DIAGNOSIS — I5032 Chronic diastolic (congestive) heart failure: Secondary | ICD-10-CM | POA: Diagnosis not present

## 2015-12-18 DIAGNOSIS — I2510 Atherosclerotic heart disease of native coronary artery without angina pectoris: Secondary | ICD-10-CM | POA: Diagnosis not present

## 2015-12-18 DIAGNOSIS — Z940 Kidney transplant status: Secondary | ICD-10-CM | POA: Diagnosis not present

## 2015-12-18 DIAGNOSIS — I132 Hypertensive heart and chronic kidney disease with heart failure and with stage 5 chronic kidney disease, or end stage renal disease: Secondary | ICD-10-CM | POA: Diagnosis not present

## 2015-12-18 DIAGNOSIS — Z79899 Other long term (current) drug therapy: Secondary | ICD-10-CM | POA: Diagnosis not present

## 2015-12-18 DIAGNOSIS — D631 Anemia in chronic kidney disease: Secondary | ICD-10-CM | POA: Diagnosis not present

## 2015-12-18 DIAGNOSIS — Z952 Presence of prosthetic heart valve: Secondary | ICD-10-CM | POA: Diagnosis not present

## 2015-12-19 LAB — POCT INR: INR: 2.1

## 2015-12-20 DIAGNOSIS — D509 Iron deficiency anemia, unspecified: Secondary | ICD-10-CM | POA: Diagnosis not present

## 2015-12-20 DIAGNOSIS — D631 Anemia in chronic kidney disease: Secondary | ICD-10-CM | POA: Diagnosis not present

## 2015-12-20 DIAGNOSIS — Z23 Encounter for immunization: Secondary | ICD-10-CM | POA: Diagnosis not present

## 2015-12-20 DIAGNOSIS — N2581 Secondary hyperparathyroidism of renal origin: Secondary | ICD-10-CM | POA: Diagnosis not present

## 2015-12-20 DIAGNOSIS — N186 End stage renal disease: Secondary | ICD-10-CM | POA: Diagnosis not present

## 2015-12-22 DIAGNOSIS — Z23 Encounter for immunization: Secondary | ICD-10-CM | POA: Diagnosis not present

## 2015-12-22 DIAGNOSIS — D631 Anemia in chronic kidney disease: Secondary | ICD-10-CM | POA: Diagnosis not present

## 2015-12-22 DIAGNOSIS — D509 Iron deficiency anemia, unspecified: Secondary | ICD-10-CM | POA: Diagnosis not present

## 2015-12-22 DIAGNOSIS — N2581 Secondary hyperparathyroidism of renal origin: Secondary | ICD-10-CM | POA: Diagnosis not present

## 2015-12-22 DIAGNOSIS — N186 End stage renal disease: Secondary | ICD-10-CM | POA: Diagnosis not present

## 2015-12-24 ENCOUNTER — Ambulatory Visit (INDEPENDENT_AMBULATORY_CARE_PROVIDER_SITE_OTHER): Payer: Self-pay | Admitting: Pharmacist

## 2015-12-24 DIAGNOSIS — Z7901 Long term (current) use of anticoagulants: Secondary | ICD-10-CM

## 2015-12-24 DIAGNOSIS — Z5181 Encounter for therapeutic drug level monitoring: Secondary | ICD-10-CM

## 2015-12-24 DIAGNOSIS — Z954 Presence of other heart-valve replacement: Secondary | ICD-10-CM

## 2015-12-24 DIAGNOSIS — Z952 Presence of prosthetic heart valve: Secondary | ICD-10-CM

## 2015-12-25 DIAGNOSIS — Z23 Encounter for immunization: Secondary | ICD-10-CM | POA: Diagnosis not present

## 2015-12-25 DIAGNOSIS — N2581 Secondary hyperparathyroidism of renal origin: Secondary | ICD-10-CM | POA: Diagnosis not present

## 2015-12-25 DIAGNOSIS — D509 Iron deficiency anemia, unspecified: Secondary | ICD-10-CM | POA: Diagnosis not present

## 2015-12-25 DIAGNOSIS — D631 Anemia in chronic kidney disease: Secondary | ICD-10-CM | POA: Diagnosis not present

## 2015-12-25 DIAGNOSIS — N186 End stage renal disease: Secondary | ICD-10-CM | POA: Diagnosis not present

## 2015-12-27 ENCOUNTER — Ambulatory Visit (INDEPENDENT_AMBULATORY_CARE_PROVIDER_SITE_OTHER): Payer: 59 | Admitting: Pharmacist

## 2015-12-27 ENCOUNTER — Ambulatory Visit: Payer: Self-pay | Admitting: Pharmacist

## 2015-12-27 ENCOUNTER — Telehealth: Payer: Self-pay | Admitting: *Deleted

## 2015-12-27 DIAGNOSIS — Z954 Presence of other heart-valve replacement: Secondary | ICD-10-CM

## 2015-12-27 DIAGNOSIS — Z5181 Encounter for therapeutic drug level monitoring: Secondary | ICD-10-CM

## 2015-12-27 DIAGNOSIS — Z7901 Long term (current) use of anticoagulants: Secondary | ICD-10-CM | POA: Diagnosis not present

## 2015-12-27 DIAGNOSIS — D509 Iron deficiency anemia, unspecified: Secondary | ICD-10-CM | POA: Diagnosis not present

## 2015-12-27 DIAGNOSIS — Z952 Presence of prosthetic heart valve: Secondary | ICD-10-CM

## 2015-12-27 DIAGNOSIS — D631 Anemia in chronic kidney disease: Secondary | ICD-10-CM | POA: Diagnosis not present

## 2015-12-27 DIAGNOSIS — N2581 Secondary hyperparathyroidism of renal origin: Secondary | ICD-10-CM | POA: Diagnosis not present

## 2015-12-27 DIAGNOSIS — Z23 Encounter for immunization: Secondary | ICD-10-CM | POA: Diagnosis not present

## 2015-12-27 DIAGNOSIS — N186 End stage renal disease: Secondary | ICD-10-CM | POA: Diagnosis not present

## 2015-12-27 LAB — POCT INR: INR: 3.7

## 2015-12-27 NOTE — Telephone Encounter (Signed)
See anticoagualation note

## 2015-12-28 ENCOUNTER — Encounter (HOSPITAL_COMMUNITY): Payer: Medicare Other

## 2015-12-28 ENCOUNTER — Encounter (HOSPITAL_BASED_OUTPATIENT_CLINIC_OR_DEPARTMENT_OTHER): Payer: Medicare Other

## 2015-12-28 VITALS — BP 106/46 | HR 83 | Temp 98.1°F | Resp 18

## 2015-12-28 DIAGNOSIS — N183 Chronic kidney disease, stage 3 unspecified: Secondary | ICD-10-CM

## 2015-12-28 DIAGNOSIS — N189 Chronic kidney disease, unspecified: Secondary | ICD-10-CM

## 2015-12-28 DIAGNOSIS — E291 Testicular hypofunction: Secondary | ICD-10-CM

## 2015-12-28 DIAGNOSIS — N186 End stage renal disease: Secondary | ICD-10-CM | POA: Diagnosis present

## 2015-12-28 DIAGNOSIS — C649 Malignant neoplasm of unspecified kidney, except renal pelvis: Secondary | ICD-10-CM | POA: Diagnosis not present

## 2015-12-28 DIAGNOSIS — I509 Heart failure, unspecified: Secondary | ICD-10-CM | POA: Diagnosis not present

## 2015-12-28 DIAGNOSIS — D631 Anemia in chronic kidney disease: Secondary | ICD-10-CM

## 2015-12-28 LAB — CBC WITH DIFFERENTIAL/PLATELET
Basophils Absolute: 0 10*3/uL (ref 0.0–0.1)
Basophils Relative: 0 %
Eosinophils Absolute: 0.1 10*3/uL (ref 0.0–0.7)
Eosinophils Relative: 1 %
HCT: 27.7 % — ABNORMAL LOW (ref 39.0–52.0)
HEMOGLOBIN: 8.4 g/dL — AB (ref 13.0–17.0)
LYMPHS ABS: 1.6 10*3/uL (ref 0.7–4.0)
LYMPHS PCT: 28 %
MCH: 29.3 pg (ref 26.0–34.0)
MCHC: 30.3 g/dL (ref 30.0–36.0)
MCV: 96.5 fL (ref 78.0–100.0)
Monocytes Absolute: 0.4 10*3/uL (ref 0.1–1.0)
Monocytes Relative: 8 %
NEUTROS ABS: 3.5 10*3/uL (ref 1.7–7.7)
NEUTROS PCT: 63 %
Platelets: 133 10*3/uL — ABNORMAL LOW (ref 150–400)
RBC: 2.87 MIL/uL — AB (ref 4.22–5.81)
RDW: 15 % (ref 11.5–15.5)
WBC: 5.6 10*3/uL (ref 4.0–10.5)

## 2015-12-28 MED ORDER — DARBEPOETIN ALFA 150 MCG/0.3ML IJ SOSY
150.0000 ug | PREFILLED_SYRINGE | Freq: Once | INTRAMUSCULAR | Status: AC
  Administered 2015-12-28: 150 ug via SUBCUTANEOUS

## 2015-12-28 MED ORDER — DARBEPOETIN ALFA 150 MCG/0.3ML IJ SOSY
PREFILLED_SYRINGE | INTRAMUSCULAR | Status: AC
  Filled 2015-12-28: qty 0.3

## 2015-12-28 MED ORDER — TESTOSTERONE CYPIONATE 200 MG/ML IM SOLN
100.0000 mg | INTRAMUSCULAR | Status: DC
  Administered 2015-12-28: 100 mg via INTRAMUSCULAR
  Filled 2015-12-28: qty 1

## 2015-12-28 NOTE — Patient Instructions (Signed)
[place] at [facility] Discharge Instructions  RECOMMENDATIONS MADE BY THE CONSULTANT AND ANY TEST RESULTS WILL BE SENT TO YOUR REFERRING PHYSICIAN.  Aranesp 150 mcg injection given today as ordered. No further aranesp to be given here as you will receive that medication at dialysis. If Mina Marble tells you you no longer need monthly lab work you may cancel lab appointments here. Return as scheduled for monthly testosterone injections.  Thank you for choosing [place] at [facility] to provide your oncology and hematology care.  To afford each patient quality time with our provider, please arrive at least 15 minutes before your scheduled appointment time.   Beginning January 23rd 2017 lab work for the [facility] will be done in the  Main lab at [facility] on 1st floor. If you have a lab appointment with the [place] please come in thru the  Main Entrance and check in at the main information desk  You need to re-schedule your appointment should you arrive 10 or more minutes late.  We strive to give you quality time with our providers, and arriving late affects you and other patients whose appointments are after yours.  Also, if you no show three or more times for appointments you may be dismissed from the clinic at the providers discretion.     Again, thank you for choosing [facility].  Our hope is that these requests will decrease the amount of time that you wait before being seen by our physicians.       _____________________________________________________________  Should you have questions after your visit to [facility], please contact our office at [phone] between the hours of 8:30 a.m. and 4:30 p.m.  Voicemails left after 4:30 p.m. will not be returned until the following business day.  For prescription refill requests, have your pharmacy contact our office.         Resources For Cancer Patients  and their Caregivers ? American Cancer Society: Can assist with transportation, wigs, general needs, runs Look Good Feel Better.        251-763-1671 ? Cancer Care: Provides financial assistance, online support groups, medication/co-pay assistance.  1-800-813-HOPE (385)548-0712) ? [facility] Assists [place] cancer patients and their families through emotional , educational and financial support.  727-857-8046 ? [place] DSS Where to apply for food stamps, Medicaid and utility assistance. 365 481 7540 ? RCATS: Transportation to medical appointments. (714)630-0706 ? Social Security Administration: May apply for disability if have a Stage IV cancer. (916)[phone] ? [employer], Disability and Transit Services: Assists with nutrition, care and transit needs. [place] Support Programs: @10RELATIVEDAYS @ > Cancer Support Group  2nd Tuesday of the month 1pm-2pm, Journey Room  > Creative Journey  3rd Tuesday of the month 1130am-1pm, Journey Room  > Look Good Feel Better  1st Wednesday of the month 10am-12 noon, Journey Room (Call [place] to register [phone])

## 2015-12-28 NOTE — Progress Notes (Signed)
Patient reports received flu vaccination this past Tuesday while at dialysis. Reports started dialysis again apx 3 weeks ago. Reports he was told at dialysis he would not have to come here for aranesp, they would be giving him what he needed at dialysis.  Reported above to Dr.Penland. Patient will continue monthly testosterone injections from [name] only. Also, patient was waiting to hear from [facility] whether or not he needed to continue monthly labs. Per MD instruct patient that is Mina Marble tells him he does not need monthly labs he can cancel the lab appointments here. Patient verbalizes understanding of all.  Elicia Lamp presents today for injection per MD orders. Aranesp 150 mcg administered SQ in left Abdomen. Administration without incident. Patient tolerated well.  Elicia Lamp presents today for injection per MD orders. Testosterone 100 mg administered IM in right upper outerGluteal. Administration without incident. Patient tolerated well.  Patient stable and ambulatory on discharge home to self.

## 2015-12-29 ENCOUNTER — Other Ambulatory Visit (HOSPITAL_COMMUNITY): Payer: Self-pay | Admitting: Oncology

## 2015-12-29 DIAGNOSIS — Z23 Encounter for immunization: Secondary | ICD-10-CM | POA: Diagnosis not present

## 2015-12-29 DIAGNOSIS — D631 Anemia in chronic kidney disease: Secondary | ICD-10-CM | POA: Diagnosis not present

## 2015-12-29 DIAGNOSIS — D509 Iron deficiency anemia, unspecified: Secondary | ICD-10-CM | POA: Diagnosis not present

## 2015-12-29 DIAGNOSIS — N186 End stage renal disease: Secondary | ICD-10-CM | POA: Diagnosis not present

## 2015-12-29 DIAGNOSIS — N2581 Secondary hyperparathyroidism of renal origin: Secondary | ICD-10-CM | POA: Diagnosis not present

## 2016-01-01 DIAGNOSIS — N186 End stage renal disease: Secondary | ICD-10-CM | POA: Diagnosis not present

## 2016-01-01 DIAGNOSIS — D509 Iron deficiency anemia, unspecified: Secondary | ICD-10-CM | POA: Diagnosis not present

## 2016-01-01 DIAGNOSIS — N2581 Secondary hyperparathyroidism of renal origin: Secondary | ICD-10-CM | POA: Diagnosis not present

## 2016-01-01 DIAGNOSIS — Z23 Encounter for immunization: Secondary | ICD-10-CM | POA: Diagnosis not present

## 2016-01-01 DIAGNOSIS — D631 Anemia in chronic kidney disease: Secondary | ICD-10-CM | POA: Diagnosis not present

## 2016-01-02 LAB — POCT INR: INR: 2.3

## 2016-01-03 ENCOUNTER — Ambulatory Visit: Payer: Self-pay | Admitting: Pharmacist

## 2016-01-03 DIAGNOSIS — Z23 Encounter for immunization: Secondary | ICD-10-CM | POA: Diagnosis not present

## 2016-01-03 DIAGNOSIS — Z7901 Long term (current) use of anticoagulants: Secondary | ICD-10-CM

## 2016-01-03 DIAGNOSIS — N186 End stage renal disease: Secondary | ICD-10-CM | POA: Diagnosis not present

## 2016-01-03 DIAGNOSIS — Z952 Presence of prosthetic heart valve: Secondary | ICD-10-CM

## 2016-01-03 DIAGNOSIS — N2581 Secondary hyperparathyroidism of renal origin: Secondary | ICD-10-CM | POA: Diagnosis not present

## 2016-01-03 DIAGNOSIS — Z5181 Encounter for therapeutic drug level monitoring: Secondary | ICD-10-CM

## 2016-01-03 DIAGNOSIS — D509 Iron deficiency anemia, unspecified: Secondary | ICD-10-CM | POA: Diagnosis not present

## 2016-01-03 DIAGNOSIS — D631 Anemia in chronic kidney disease: Secondary | ICD-10-CM | POA: Diagnosis not present

## 2016-01-03 NOTE — Progress Notes (Signed)
Patient ID: Jeremy [name], male   DOB: 1955/08/09, 60 y.o.   MRN: [id number]   No charge for labs or visit - INR check through home monitoring system

## 2016-01-04 DIAGNOSIS — M7989 Other specified soft tissue disorders: Secondary | ICD-10-CM | POA: Diagnosis not present

## 2016-01-04 DIAGNOSIS — M79671 Pain in right foot: Secondary | ICD-10-CM | POA: Diagnosis not present

## 2016-01-04 DIAGNOSIS — M25474 Effusion, right foot: Secondary | ICD-10-CM | POA: Diagnosis not present

## 2016-01-05 DIAGNOSIS — N186 End stage renal disease: Secondary | ICD-10-CM | POA: Diagnosis not present

## 2016-01-05 DIAGNOSIS — D631 Anemia in chronic kidney disease: Secondary | ICD-10-CM | POA: Diagnosis not present

## 2016-01-05 DIAGNOSIS — T8610 Unspecified complication of kidney transplant: Secondary | ICD-10-CM | POA: Diagnosis not present

## 2016-01-05 DIAGNOSIS — N2581 Secondary hyperparathyroidism of renal origin: Secondary | ICD-10-CM | POA: Diagnosis not present

## 2016-01-05 DIAGNOSIS — Z992 Dependence on renal dialysis: Secondary | ICD-10-CM | POA: Diagnosis not present

## 2016-01-05 DIAGNOSIS — D509 Iron deficiency anemia, unspecified: Secondary | ICD-10-CM | POA: Diagnosis not present

## 2016-01-05 DIAGNOSIS — Z23 Encounter for immunization: Secondary | ICD-10-CM | POA: Diagnosis not present

## 2016-01-08 DIAGNOSIS — D509 Iron deficiency anemia, unspecified: Secondary | ICD-10-CM | POA: Diagnosis not present

## 2016-01-08 DIAGNOSIS — D631 Anemia in chronic kidney disease: Secondary | ICD-10-CM | POA: Diagnosis not present

## 2016-01-08 DIAGNOSIS — N2581 Secondary hyperparathyroidism of renal origin: Secondary | ICD-10-CM | POA: Diagnosis not present

## 2016-01-08 DIAGNOSIS — N186 End stage renal disease: Secondary | ICD-10-CM | POA: Diagnosis not present

## 2016-01-10 ENCOUNTER — Ambulatory Visit (INDEPENDENT_AMBULATORY_CARE_PROVIDER_SITE_OTHER): Payer: 59 | Admitting: Pharmacist

## 2016-01-10 ENCOUNTER — Telehealth: Payer: Self-pay

## 2016-01-10 DIAGNOSIS — N186 End stage renal disease: Secondary | ICD-10-CM | POA: Diagnosis not present

## 2016-01-10 DIAGNOSIS — Z952 Presence of prosthetic heart valve: Secondary | ICD-10-CM

## 2016-01-10 DIAGNOSIS — Z7901 Long term (current) use of anticoagulants: Secondary | ICD-10-CM | POA: Diagnosis not present

## 2016-01-10 DIAGNOSIS — Z5181 Encounter for therapeutic drug level monitoring: Secondary | ICD-10-CM | POA: Diagnosis not present

## 2016-01-10 DIAGNOSIS — D631 Anemia in chronic kidney disease: Secondary | ICD-10-CM | POA: Diagnosis not present

## 2016-01-10 DIAGNOSIS — D509 Iron deficiency anemia, unspecified: Secondary | ICD-10-CM | POA: Diagnosis not present

## 2016-01-10 DIAGNOSIS — N2581 Secondary hyperparathyroidism of renal origin: Secondary | ICD-10-CM | POA: Diagnosis not present

## 2016-01-10 NOTE — Telephone Encounter (Signed)
patietn called - instructed to hold warfarin for 1 day, then decrease warfarin dose to 3mg  on Sundays and fridays only - take 2mg  all other days.  Recheck INR weekly with home INR monitor

## 2016-01-10 NOTE — Consult Note (Signed)
Subjective:     Indication: aortic valve replacement Bleeding signs/symptoms: None Thromboembolic signs/symptoms: None  Missed Coumadin doses: None Medication changes: no Dietary changes: no Bacterial/viral infection: no Other concerns: no   Objective:    INR Today: 3.4  Current dose: warfarin 1mg  tablets - take 3 tablets = 3mg  sundays, wednesdays and fridays.  Take 2 tablets = 2mg  all other days.    Assessment:    Supratherapeutic INR for goal of 2-3   Plan:    1. New dose: no warfarin for 1 day, then decrease warfarin dose to 3mg  sundays and fridays, 2mg  all other days.   2. Next INR: 1 week   Patient checks INR at home with Home INR monitoring.  Billing once per month interupertation fee.  Patient diagnosis - history of aortic valve replacement / chronic anticoagulation.  Procedure code if [id number]

## 2016-01-11 ENCOUNTER — Ambulatory Visit (HOSPITAL_COMMUNITY): Payer: Medicare Other

## 2016-01-11 ENCOUNTER — Other Ambulatory Visit (HOSPITAL_COMMUNITY): Payer: Medicare Other

## 2016-01-12 DIAGNOSIS — N186 End stage renal disease: Secondary | ICD-10-CM | POA: Diagnosis not present

## 2016-01-12 DIAGNOSIS — N2581 Secondary hyperparathyroidism of renal origin: Secondary | ICD-10-CM | POA: Diagnosis not present

## 2016-01-12 DIAGNOSIS — D631 Anemia in chronic kidney disease: Secondary | ICD-10-CM | POA: Diagnosis not present

## 2016-01-12 DIAGNOSIS — D509 Iron deficiency anemia, unspecified: Secondary | ICD-10-CM | POA: Diagnosis not present

## 2016-01-15 DIAGNOSIS — D631 Anemia in chronic kidney disease: Secondary | ICD-10-CM | POA: Diagnosis not present

## 2016-01-15 DIAGNOSIS — N186 End stage renal disease: Secondary | ICD-10-CM | POA: Diagnosis not present

## 2016-01-15 DIAGNOSIS — N2581 Secondary hyperparathyroidism of renal origin: Secondary | ICD-10-CM | POA: Diagnosis not present

## 2016-01-15 DIAGNOSIS — D509 Iron deficiency anemia, unspecified: Secondary | ICD-10-CM | POA: Diagnosis not present

## 2016-01-16 ENCOUNTER — Other Ambulatory Visit (HOSPITAL_COMMUNITY): Payer: Self-pay | Admitting: Hematology & Oncology

## 2016-01-16 DIAGNOSIS — Z7901 Long term (current) use of anticoagulants: Secondary | ICD-10-CM | POA: Diagnosis not present

## 2016-01-16 DIAGNOSIS — I132 Hypertensive heart and chronic kidney disease with heart failure and with stage 5 chronic kidney disease, or end stage renal disease: Secondary | ICD-10-CM | POA: Diagnosis not present

## 2016-01-16 DIAGNOSIS — N186 End stage renal disease: Secondary | ICD-10-CM | POA: Diagnosis not present

## 2016-01-16 DIAGNOSIS — I509 Heart failure, unspecified: Secondary | ICD-10-CM | POA: Diagnosis not present

## 2016-01-16 DIAGNOSIS — Z8553 Personal history of malignant neoplasm of renal pelvis: Secondary | ICD-10-CM | POA: Diagnosis not present

## 2016-01-16 DIAGNOSIS — E785 Hyperlipidemia, unspecified: Secondary | ICD-10-CM | POA: Diagnosis not present

## 2016-01-16 DIAGNOSIS — Z992 Dependence on renal dialysis: Secondary | ICD-10-CM | POA: Diagnosis not present

## 2016-01-16 DIAGNOSIS — K469 Unspecified abdominal hernia without obstruction or gangrene: Secondary | ICD-10-CM | POA: Diagnosis not present

## 2016-01-16 DIAGNOSIS — Z955 Presence of coronary angioplasty implant and graft: Secondary | ICD-10-CM | POA: Diagnosis not present

## 2016-01-16 DIAGNOSIS — I2510 Atherosclerotic heart disease of native coronary artery without angina pectoris: Secondary | ICD-10-CM | POA: Diagnosis not present

## 2016-01-16 DIAGNOSIS — Z4822 Encounter for aftercare following kidney transplant: Secondary | ICD-10-CM | POA: Diagnosis not present

## 2016-01-17 DIAGNOSIS — N2581 Secondary hyperparathyroidism of renal origin: Secondary | ICD-10-CM | POA: Diagnosis not present

## 2016-01-17 DIAGNOSIS — D631 Anemia in chronic kidney disease: Secondary | ICD-10-CM | POA: Diagnosis not present

## 2016-01-17 DIAGNOSIS — N186 End stage renal disease: Secondary | ICD-10-CM | POA: Diagnosis not present

## 2016-01-17 DIAGNOSIS — D509 Iron deficiency anemia, unspecified: Secondary | ICD-10-CM | POA: Diagnosis not present

## 2016-01-19 DIAGNOSIS — D631 Anemia in chronic kidney disease: Secondary | ICD-10-CM | POA: Diagnosis not present

## 2016-01-19 DIAGNOSIS — D509 Iron deficiency anemia, unspecified: Secondary | ICD-10-CM | POA: Diagnosis not present

## 2016-01-19 DIAGNOSIS — N2581 Secondary hyperparathyroidism of renal origin: Secondary | ICD-10-CM | POA: Diagnosis not present

## 2016-01-19 DIAGNOSIS — N186 End stage renal disease: Secondary | ICD-10-CM | POA: Diagnosis not present

## 2016-01-22 DIAGNOSIS — N2581 Secondary hyperparathyroidism of renal origin: Secondary | ICD-10-CM | POA: Diagnosis not present

## 2016-01-22 DIAGNOSIS — N186 End stage renal disease: Secondary | ICD-10-CM | POA: Diagnosis not present

## 2016-01-22 DIAGNOSIS — D631 Anemia in chronic kidney disease: Secondary | ICD-10-CM | POA: Diagnosis not present

## 2016-01-22 DIAGNOSIS — D509 Iron deficiency anemia, unspecified: Secondary | ICD-10-CM | POA: Diagnosis not present

## 2016-01-24 DIAGNOSIS — D631 Anemia in chronic kidney disease: Secondary | ICD-10-CM | POA: Diagnosis not present

## 2016-01-24 DIAGNOSIS — N186 End stage renal disease: Secondary | ICD-10-CM | POA: Diagnosis not present

## 2016-01-24 DIAGNOSIS — D509 Iron deficiency anemia, unspecified: Secondary | ICD-10-CM | POA: Diagnosis not present

## 2016-01-24 DIAGNOSIS — N2581 Secondary hyperparathyroidism of renal origin: Secondary | ICD-10-CM | POA: Diagnosis not present

## 2016-01-25 ENCOUNTER — Encounter (HOSPITAL_COMMUNITY): Payer: Medicare Other | Attending: Hematology & Oncology

## 2016-01-25 ENCOUNTER — Other Ambulatory Visit (HOSPITAL_COMMUNITY): Payer: Self-pay

## 2016-01-25 ENCOUNTER — Other Ambulatory Visit (HOSPITAL_COMMUNITY): Payer: Medicare Other

## 2016-01-25 VITALS — BP 122/62 | HR 80 | Temp 98.0°F | Resp 16

## 2016-01-25 DIAGNOSIS — G4733 Obstructive sleep apnea (adult) (pediatric): Secondary | ICD-10-CM | POA: Insufficient documentation

## 2016-01-25 DIAGNOSIS — D649 Anemia, unspecified: Secondary | ICD-10-CM | POA: Insufficient documentation

## 2016-01-25 DIAGNOSIS — Z992 Dependence on renal dialysis: Secondary | ICD-10-CM | POA: Insufficient documentation

## 2016-01-25 DIAGNOSIS — Z952 Presence of prosthetic heart valve: Secondary | ICD-10-CM | POA: Insufficient documentation

## 2016-01-25 DIAGNOSIS — M1990 Unspecified osteoarthritis, unspecified site: Secondary | ICD-10-CM | POA: Insufficient documentation

## 2016-01-25 DIAGNOSIS — N186 End stage renal disease: Secondary | ICD-10-CM

## 2016-01-25 DIAGNOSIS — Z85528 Personal history of other malignant neoplasm of kidney: Secondary | ICD-10-CM | POA: Insufficient documentation

## 2016-01-25 DIAGNOSIS — Z7901 Long term (current) use of anticoagulants: Secondary | ICD-10-CM | POA: Diagnosis not present

## 2016-01-25 DIAGNOSIS — Z9889 Other specified postprocedural states: Secondary | ICD-10-CM | POA: Insufficient documentation

## 2016-01-25 DIAGNOSIS — N189 Chronic kidney disease, unspecified: Secondary | ICD-10-CM | POA: Insufficient documentation

## 2016-01-25 DIAGNOSIS — Z954 Presence of other heart-valve replacement: Secondary | ICD-10-CM | POA: Diagnosis not present

## 2016-01-25 DIAGNOSIS — I129 Hypertensive chronic kidney disease with stage 1 through stage 4 chronic kidney disease, or unspecified chronic kidney disease: Secondary | ICD-10-CM | POA: Insufficient documentation

## 2016-01-25 DIAGNOSIS — E291 Testicular hypofunction: Secondary | ICD-10-CM | POA: Diagnosis present

## 2016-01-25 DIAGNOSIS — Z79899 Other long term (current) drug therapy: Secondary | ICD-10-CM | POA: Insufficient documentation

## 2016-01-25 DIAGNOSIS — N183 Chronic kidney disease, stage 3 unspecified: Secondary | ICD-10-CM

## 2016-01-25 DIAGNOSIS — E785 Hyperlipidemia, unspecified: Secondary | ICD-10-CM | POA: Insufficient documentation

## 2016-01-25 DIAGNOSIS — D631 Anemia in chronic kidney disease: Secondary | ICD-10-CM

## 2016-01-25 DIAGNOSIS — Z940 Kidney transplant status: Secondary | ICD-10-CM | POA: Insufficient documentation

## 2016-01-25 MED ORDER — TESTOSTERONE CYPIONATE 200 MG/ML IM SOLN
INTRAMUSCULAR | Status: AC
Start: 2016-01-25 — End: 2016-01-25
  Filled 2016-01-25: qty 1

## 2016-01-25 MED ORDER — TESTOSTERONE CYPIONATE 200 MG/ML IM SOLN
100.0000 mg | INTRAMUSCULAR | Status: DC
  Administered 2016-01-25: 100 mg via INTRAMUSCULAR

## 2016-01-25 NOTE — Patient Instructions (Signed)
[place] at [facility] Discharge Instructions  RECOMMENDATIONS MADE BY THE CONSULTANT AND ANY TEST RESULTS WILL BE SENT TO YOUR REFERRING PHYSICIAN.  Testosterone injection today.    Thank you for choosing [place] at [facility] to provide your oncology and hematology care.  To afford each patient quality time with our provider, please arrive at least 15 minutes before your scheduled appointment time.   Beginning January 23rd 2017 lab work for the [facility] will be done in the  Main lab at [facility] on 1st floor. If you have a lab appointment with the [facility] please come in thru the  Main Entrance and check in at the main information desk  You need to re-schedule your appointment should you arrive 10 or more minutes late.  We strive to give you quality time with our providers, and arriving late affects you and other patients whose appointments are after yours.  Also, if you no show three or more times for appointments you may be dismissed from the clinic at the providers discretion.     Again, thank you for choosing [facility].  Our hope is that these requests will decrease the amount of time that you wait before being seen by our physicians.       _____________________________________________________________  Should you have questions after your visit to [facility], please contact our office at [phone] between the hours of 8:30 a.m. and 4:30 p.m.  Voicemails left after 4:30 p.m. will not be returned until the following business day.  For prescription refill requests, have your pharmacy contact our office.         Resources For Cancer Patients and their Caregivers ? American Cancer Society: Can assist with transportation, wigs, general needs, runs Look Good Feel Better.        775-604-9605 ? Cancer Care: Provides financial assistance, online support groups, medication/co-pay assistance.   1-800-813-HOPE 928 614 2664) ? [place] Assists [place] cancer patients and their families through emotional , educational and financial support.  (765) 765-1732 ? [place] DSS Where to apply for food stamps, Medicaid and utility assistance. (570) 700-7058 ? RCATS: Transportation to medical appointments. 979-126-6358 ? Social Security Administration: May apply for disability if have a Stage IV cancer. 763-453-7847 754-726-8792 ? [employer], Disability and Transit Services: Assists with nutrition, care and transit needs. [place] Support Programs: @10RELATIVEDAYS @ > Cancer Support Group  2nd Tuesday of the month 1pm-2pm, Journey Room  > Creative Journey  3rd Tuesday of the month 1130am-1pm, Journey Room  > Look Good Feel Better  1st Wednesday of the month 10am-12 noon, Journey Room (Call [place] to register [phone])

## 2016-01-25 NOTE — Progress Notes (Signed)
Jeremy [name] presents today for injection per MD orders. Testosterone 100mg  administered IM in right Gluteal. Administration without incident. Patient tolerated well.

## 2016-01-26 DIAGNOSIS — N2581 Secondary hyperparathyroidism of renal origin: Secondary | ICD-10-CM | POA: Diagnosis not present

## 2016-01-26 DIAGNOSIS — N186 End stage renal disease: Secondary | ICD-10-CM | POA: Diagnosis not present

## 2016-01-26 DIAGNOSIS — D509 Iron deficiency anemia, unspecified: Secondary | ICD-10-CM | POA: Diagnosis not present

## 2016-01-26 DIAGNOSIS — D631 Anemia in chronic kidney disease: Secondary | ICD-10-CM | POA: Diagnosis not present

## 2016-01-28 DIAGNOSIS — D631 Anemia in chronic kidney disease: Secondary | ICD-10-CM | POA: Diagnosis not present

## 2016-01-28 DIAGNOSIS — N186 End stage renal disease: Secondary | ICD-10-CM | POA: Diagnosis not present

## 2016-01-28 DIAGNOSIS — N2581 Secondary hyperparathyroidism of renal origin: Secondary | ICD-10-CM | POA: Diagnosis not present

## 2016-01-28 DIAGNOSIS — D509 Iron deficiency anemia, unspecified: Secondary | ICD-10-CM | POA: Diagnosis not present

## 2016-01-30 DIAGNOSIS — D631 Anemia in chronic kidney disease: Secondary | ICD-10-CM | POA: Diagnosis not present

## 2016-01-30 DIAGNOSIS — D509 Iron deficiency anemia, unspecified: Secondary | ICD-10-CM | POA: Diagnosis not present

## 2016-01-30 DIAGNOSIS — N186 End stage renal disease: Secondary | ICD-10-CM | POA: Diagnosis not present

## 2016-01-30 DIAGNOSIS — N2581 Secondary hyperparathyroidism of renal origin: Secondary | ICD-10-CM | POA: Diagnosis not present

## 2016-02-01 ENCOUNTER — Encounter: Payer: Self-pay | Admitting: Family Medicine

## 2016-02-01 ENCOUNTER — Telehealth: Payer: Self-pay

## 2016-02-01 DIAGNOSIS — N2581 Secondary hyperparathyroidism of renal origin: Secondary | ICD-10-CM | POA: Diagnosis not present

## 2016-02-01 DIAGNOSIS — D631 Anemia in chronic kidney disease: Secondary | ICD-10-CM | POA: Diagnosis not present

## 2016-02-01 DIAGNOSIS — N186 End stage renal disease: Secondary | ICD-10-CM | POA: Diagnosis not present

## 2016-02-01 DIAGNOSIS — D509 Iron deficiency anemia, unspecified: Secondary | ICD-10-CM | POA: Diagnosis not present

## 2016-02-01 LAB — PROTIME-INR: INR: 1.9 — AB (ref 0.9–1.1)

## 2016-02-01 NOTE — Telephone Encounter (Signed)
Left detailed message per dpr- Inr results, no change and to follow up in one week.

## 2016-02-04 DIAGNOSIS — D509 Iron deficiency anemia, unspecified: Secondary | ICD-10-CM | POA: Diagnosis not present

## 2016-02-04 DIAGNOSIS — N2581 Secondary hyperparathyroidism of renal origin: Secondary | ICD-10-CM | POA: Diagnosis not present

## 2016-02-04 DIAGNOSIS — D631 Anemia in chronic kidney disease: Secondary | ICD-10-CM | POA: Diagnosis not present

## 2016-02-04 DIAGNOSIS — N186 End stage renal disease: Secondary | ICD-10-CM | POA: Diagnosis not present

## 2016-02-05 DIAGNOSIS — N186 End stage renal disease: Secondary | ICD-10-CM | POA: Diagnosis not present

## 2016-02-05 DIAGNOSIS — T8610 Unspecified complication of kidney transplant: Secondary | ICD-10-CM | POA: Diagnosis not present

## 2016-02-05 DIAGNOSIS — Z992 Dependence on renal dialysis: Secondary | ICD-10-CM | POA: Diagnosis not present

## 2016-02-06 ENCOUNTER — Other Ambulatory Visit (HOSPITAL_COMMUNITY): Payer: Self-pay | Admitting: *Deleted

## 2016-02-06 DIAGNOSIS — N2581 Secondary hyperparathyroidism of renal origin: Secondary | ICD-10-CM | POA: Diagnosis not present

## 2016-02-06 DIAGNOSIS — N189 Chronic kidney disease, unspecified: Principal | ICD-10-CM

## 2016-02-06 DIAGNOSIS — D509 Iron deficiency anemia, unspecified: Secondary | ICD-10-CM | POA: Diagnosis not present

## 2016-02-06 DIAGNOSIS — N186 End stage renal disease: Secondary | ICD-10-CM | POA: Diagnosis not present

## 2016-02-06 DIAGNOSIS — D631 Anemia in chronic kidney disease: Secondary | ICD-10-CM

## 2016-02-07 ENCOUNTER — Encounter (HOSPITAL_COMMUNITY): Payer: Medicare Other | Attending: Hematology & Oncology | Admitting: Hematology & Oncology

## 2016-02-07 ENCOUNTER — Encounter (HOSPITAL_COMMUNITY): Payer: Medicare Other

## 2016-02-07 ENCOUNTER — Encounter (HOSPITAL_COMMUNITY): Payer: Self-pay | Admitting: Hematology & Oncology

## 2016-02-07 VITALS — BP 135/59 | HR 66 | Temp 98.2°F | Resp 16 | Wt 175.9 lb

## 2016-02-07 DIAGNOSIS — N183 Chronic kidney disease, stage 3 unspecified: Secondary | ICD-10-CM

## 2016-02-07 DIAGNOSIS — D631 Anemia in chronic kidney disease: Secondary | ICD-10-CM | POA: Diagnosis not present

## 2016-02-07 DIAGNOSIS — N189 Chronic kidney disease, unspecified: Secondary | ICD-10-CM | POA: Diagnosis not present

## 2016-02-07 DIAGNOSIS — N186 End stage renal disease: Secondary | ICD-10-CM

## 2016-02-07 DIAGNOSIS — C649 Malignant neoplasm of unspecified kidney, except renal pelvis: Secondary | ICD-10-CM

## 2016-02-07 DIAGNOSIS — E291 Testicular hypofunction: Secondary | ICD-10-CM

## 2016-02-07 DIAGNOSIS — Z992 Dependence on renal dialysis: Secondary | ICD-10-CM | POA: Diagnosis not present

## 2016-02-07 LAB — SAMPLE TO BLOOD BANK

## 2016-02-07 LAB — HEMOGLOBIN: Hemoglobin: 12.1 g/dL — ABNORMAL LOW (ref 13.0–17.0)

## 2016-02-07 NOTE — Progress Notes (Signed)
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 Moderate arterionephrosclerosis with focal features of accelerated hypertension related injury, moderate diabetic nephropathy, recurrent IgA nephropathy Dialysis  HISTORY OF PRESENTING ILLNESS:  Jeremy [name] 60 y.o. male is here for follow-up of anemia secondary to CKD, hypogonadism.  Patient states dialysis is going well. He is taking Procrit. He denies fatigue. His only complaint is joint pain in his foot due to Gout. Prednisone alleviated his symptoms.   Jeremy [name] is not on transplant list at [facility] anymore. He says he would like to wait a while before having a transplant, because he is finally at the point where he has his energy back. He will be seeing Dr. Florene Glen soon.   Patient denies hematochezia or hematuria.  Patient has had flu and pneumonia shot. Energy is improved. Appetite is fairly good. No major complaints today. Edema is improved as well.   MEDICAL HISTORY:  Past Medical History:  Diagnosis Date  . Anemia   . Arthritis   . Blood transfusion without reported diagnosis   . Cancer ([place])   . Chronic anticoagulation 06/2012  . Dialysis patient [facility]) 01 01 2008  . Hx of colonoscopy    2009 by Dr. Laural Golden. Normal except for small submucosal lipoma. No evidence of polyps or colitis.  . Hyperlipidemia   . Hypertension   . Kidney disease    Chronic kidney disease secondary to IgA nephropathy diagnosed when he was 7. Kidney  transplant in 2000 and renal cell carcinoma and transplanted kidney removed along with his own diseased kidneys.  Marland Kitchen OSA (obstructive sleep apnea)     SURGICAL HISTORY: Past Surgical History:  Procedure Laterality Date  . AORTIC VALVE REPLACEMENT  3 /11 /2014   At [facility]    . CORONARY ANGIOPLASTY WITH STENT PLACEMENT    . DG AV DIALYSIS  SHUNT ACCESS EXIST*L* OR     left arm  . KIDNEY TRANSPLANT      SOCIAL HISTORY: Social History   Social History  . Marital status: Divorced    Spouse name: N/A  . Number of children: N/A  . Years of education: N/A   Occupational History  . courier     [employer]   Social History Main Topics  . Smoking status: Never Smoker  . Smokeless tobacco: Never Used  . Alcohol use No  . Drug use: No  . Sexual activity: Not on file   Other Topics Concern  . Not on file   Social History Narrative   Single   1 daughter 42    Divorced 16 y/o daughter; son killed in a car accident when he was 38, a while back 2 grandsons, both aged 26 Don't smoke, don't drink; "cuss a little bit, but other than that ..." Used to work for [employer], but transplant has cost him everything No energy, dialysis  No hobbies right now; no energy to do anything Used to like doing woodworking; furniture, cabinets, keeping busy Likes fixing and flipping cars  FAMILY HISTORY: History  reviewed. No pertinent family history. indicated that his mother is deceased. He indicated that his father is alive. He reported the following about his sister: health unknown.He reported the following about his brother: unknown.He indicated that his daughter is alive. He indicated that his other is alive.   Father still living; 27-80 Not healthy, has a touch of dementia and heart issues  Sister died of kidney issues when she was very young Other than that, no one with kidney problems  Mother died with cancer; maybe pancreatic, unsure  of type; aged 41-53  Brother in perfect health, aged 33  ALLERGIES:  is allergic to atorvastatin; lipitor [atorvastatin calcium]; and sertraline.  MEDICATIONS:  Current Outpatient Prescriptions  Medication Sig Dispense Refill  . acetaminophen (TYLENOL) 325 MG tablet Take 650 mg by mouth every 6 (six) hours as needed.    Marland Kitchen allopurinol (ZYLOPRIM) 100 MG tablet Take 200 mg by mouth daily.     . B Complex Vitamins (VITAMIN-B COMPLEX) TABS Take 1 tablet by mouth.    . Darbepoetin Alfa (ARANESP, ALBUMIN FREE,) 150 MCG/0.3ML SOSY injection Inject 150 mcg into the skin. Every 2 weeks    . dicyclomine (BENTYL) 10 MG capsule Take 2 capsules (20 mg total) by mouth 3 (three) times daily before meals. (Patient taking differently: Take 20 mg by mouth 2 (two) times daily. ) 90 capsule 4  . diphenoxylate-atropine (LOMOTIL) 2.5-0.025 MG tablet Take 1 tablet by mouth 4 (four) times daily as needed for diarrhea or loose stools. 60 tablet 0  . DOCOSAHEXAENOIC ACID PO Take 2-3 g by mouth.    . folic acid-vitamin b complex-vitamin c-selenium-zinc (DIALYVITE) 3 MG TABS Take 1 tablet by mouth daily.      Marland Kitchen labetalol (NORMODYNE) 300 MG tablet Take 600 mg by mouth 3 (three) times daily.    Marland Kitchen lidocaine-prilocaine (EMLA) cream Apply topically.    Marland Kitchen loratadine (CLARITIN) 10 MG tablet Take 10 mg by mouth. Reported on 06/12/2015    . magnesium oxide (MAG-OX) 400 (241.3 MG) MG tablet Take 400 mg by mouth 2 (two) times daily.  1  . NIFEdipine (PROCARDIA-XL/ADALAT-CC/NIFEDICAL-XL) 30 MG 24 hr tablet Take by mouth.    . nitroGLYCERIN (NITROSTAT) 0.4 MG SL tablet Place 1 tablet (0.4 mg total) under the tongue every 5 (five) minutes as needed for chest pain. 25 tablet 2  . Omega-3 1000 MG CAPS Take 3 g by mouth.    Marland Kitchen omeprazole (PRILOSEC) 20 MG capsule Take 20 mg by mouth daily.    . pravastatin (PRAVACHOL) 40 MG tablet     . predniSONE (DELTASONE) 5 MG tablet Take 1 pill daily    . sulfamethoxazole-trimethoprim  (BACTRIM,SEPTRA) 400-80 MG per tablet Take 1 tablet by mouth 3 (three) times a week.    . tacrolimus (PROGRAF) 1 MG capsule Take 1 mg by mouth at bedtime. Take 2 mg in am and 1 mg in pm    . warfarin (COUMADIN) 1 MG tablet TAKE 1-3 TABLETS BY MOUTH ONCE DAILY AS DIRECTED BY ANTICOAGULATION CLINIC. (Patient taking differently: Takes 2 tabs every other day and 3 tabs rest of time (46m per tab).) 100 tablet 2   No current facility-administered medications for this visit.     Review of Systems  Constitutional: Negative.  HENT: Negative.   Eyes: Negative.   Respiratory: Negative.   Cardiovascular: Negative.  Gastrointestinal: Negative.   Genitourinary: Negative.   Musculoskeletal: Negative.   Skin: Negative.   Neurological: Negative.  Endo/Heme/Allergies: Negative.   Psychiatric/Behavioral: Negative.  All other systems reviewed and are negative. 14 point ROS was done and is otherwise as detailed above or in HPI  PHYSICAL EXAMINATION: ECOG PERFORMANCE STATUS: 1 - Symptomatic but completely ambulatory Vitals with BMI 02/07/2016  Height   Weight 175 lbs 14 oz  BMI   Systolic 161  Diastolic 59  Pulse 66  Respirations 16    Physical Exam  Constitutional: He is oriented to person, place, and time and well-developed, well-nourished, and in no distress.  HENT:  Head: Normocephalic and atraumatic.  Nose: Nose normal.  Mouth/Throat: Oropharynx is clear and moist. No oropharyngeal exudate.  Eyes: Conjunctivae and EOM are normal. Pupils are equal, round, and reactive to light. Right eye exhibits no discharge. Left eye exhibits no discharge. No scleral icterus. periorbital edema Neck: Normal range of motion. Neck supple. No tracheal deviation present. No thyromegaly present.  Cardiovascular: Normal rate, regular rhythm, normal heart sounds and intact distal pulses.  Exam reveals no gallop and no friction rub. Right leg chronically bigger than left.  Bilateral LE edema markedly improved,  trace at ankles No murmur heard. Has a mechanical heart valve.  Pulmonary/Chest: Effort normal and breath sounds normal. He has no wheezes. He has no rales.  Abdominal: Soft. Bowel sounds are normal. Mild abdominal distension noted. There is no tenderness. There is no rebound and no guarding.  Musculoskeletal: Normal range of motion. He exhibits no edema.  Lymphadenopathy:    He has no cervical adenopathy.  Neurological: He is alert and oriented to person, place, and time. He has normal reflexes. No cranial nerve deficit. Gait normal. Coordination normal.  Skin: Skin is warm and dry. No rash noted.  Psychiatric: Mood, memory, affect and judgment normal.  Nursing note and vitals reviewed.   LABORATORY DATA:  I have reviewed the data as listed Results for Jeremy, [name] (MRN [id number])   Ref. Range 02/07/2016 11:00  Hemoglobin Latest Ref Range: 13.0 - 17.0 g/dL 12.1 (L)   Results for Jeremy, [name] (MRN [id number]) as of 12/15/2015 19:01  Ref. Range 12/14/2015 10:59  Sodium Latest Ref Range: 135 - 145 mmol/L 137  Potassium Latest Ref Range: 3.5 - 5.1 mmol/L 3.3 (L)  Chloride Latest Ref Range: 101 - 111 mmol/L 107  CO2 Latest Ref Range: 22 - 32 mmol/L 24  BUN Latest Ref Range: 6 - 20 mg/dL 58 (H)  Creatinine Latest Ref Range: 0.61 - 1.24 mg/dL 3.97 (H)  Calcium Latest Ref Range: 8.9 - 10.3 mg/dL 8.8 (L)  EGFR (Non-African Amer.) Latest Ref Range: >60 mL/min 15 (L)  EGFR (African American) Latest Ref Range: >60 mL/min 18 (L)  Glucose Latest Ref Range: 65 - 99 mg/dL 101 (H)  Anion gap Latest Ref Range: 5 - 15  6  Calcium, Total (PTH) Latest Ref Range: 8.7 - 10.2 mg/dL 9.2  Phosphorus Latest Ref Range: 2.5 - 4.6 mg/dL 3.8  Magnesium Latest Ref Range: 1.7 - 2.4 mg/dL 1.7  Alkaline Phosphatase Latest Ref Range: 38 - 126 U/L 211 (H)  Albumin Latest Ref Range: 3.5 - 5.0 g/dL 3.5  Uric Acid, Serum Latest Ref Range: 4.4 - 7.6 mg/dL 3.4 (L)  AST Latest Ref Range: 15 - 41 U/L 47 (H)  ALT Latest Ref  Range: 17 - 63 U/L 43  Total Protein Latest Ref Range: 6.5 - 8.1 g/dL 6.5  Total Bilirubin Latest Ref Range: 0.3 - 1.2 mg/dL 0.4  Iron Latest Ref Range: 45 - 182 ug/dL 205 (H)  UIBC Latest Units: ug/dL 32  TIBC Latest Ref Range: 250 - 450 ug/dL 237 (L)  Saturation Ratios Latest Ref Range: 17.9 - 39.5 % 87 (H)  Ferritin Latest Ref Range: 24 - 336 ng/mL 877 (H)  Vitamin D, 25-Hydroxy Latest Ref Range: 30.0 - 100.0 ng/mL 48.8  WBC Latest Ref Range: 4.0 - 10.5 K/uL 5.8  RBC Latest Ref Range: 4.22 - 5.81 MIL/uL 3.54 (L)  Hemoglobin Latest Ref Range: 13.0 - 17.0 g/dL 10.4 (L)  HCT Latest Ref Range: 39.0 - 52.0 % 33.7 (L)  MCV Latest Ref Range: 78.0 - 100.0 fL 95.2  MCH Latest Ref Range: 26.0 - 34.0 pg 29.4  MCHC Latest Ref Range: 30.0 - 36.0 g/dL 30.9  RDW Latest Ref Range: 11.5 - 15.5 % 14.9  Platelets Latest Ref Range: 150 - 400 K/uL 112 (L)  Neutrophils Latest Units: % 61  Lymphocytes Latest Units: % 29  Monocytes Relative Latest Units: % 9  Eosinophil Latest Units: % 1  Basophil Latest Units: % 0  NEUT# Latest Ref Range: 1.7 - 7.7 K/uL 3.5  Lymphocyte # Latest Ref Range: 0.7 - 4.0 K/uL 1.7  Monocyte # Latest Ref Range: 0.1 - 1.0 K/uL 0.5  Eosinophils Absolute Latest Ref Range: 0.0 - 0.7 K/uL 0.0  Basophils Absolute Latest Ref Range: 0.0 - 0.1 K/uL 0.0  PTH Latest Ref Range: 15 - 65 pg/mL 281 (H)   Results for Jeremy, [name] (MRN [id number]) as of 02/07/2016 11:57  Ref. Range 12/19/2015 00:00 12/27/2015 10:21 12/28/2015 09:26 01/02/2016 09:03 02/07/2016 11:00  Hemoglobin Latest Ref Range: 13.0 - 17.0 g/dL   8.4 (L)  12.1 (L)    RADIOLOGY:  I have personally reviewed the data as listed below. Study Result   INDICATION: 60 year old male with a history of anemia. He has been referred for bone marrow biopsy.  EXAM: CT BIOPSY  MEDICATIONS: None.  ANESTHESIA/SEDATION: Moderate (conscious) sedation was employed during this procedure. A total of Versed 2.5 mg and Fentanyl 100 mcg  was administered intravenously.  Moderate Sedation Time: 12 minutes. The patient's level of consciousness and vital signs were monitored continuously by radiology nursing throughout the procedure under my direct supervision.  FLUOROSCOPY TIME:  CT  COMPLICATIONS: None  PROCEDURE: The procedure risks, benefits, and alternatives were explained to the patient. Questions regarding the procedure were encouraged and answered. The patient understands and consents to the procedure.  Scout CT of the pelvis was performed for surgical planning purposes.  The posterior pelvis was prepped with Betadinein a sterile fashion, and a sterile drape was applied covering the operative field. A sterile gown and sterile gloves were used for the procedure. Local anesthesia was provided with 1% Lidocaine.  We targeted the right posterior iliac bone for biopsy. The skin and subcutaneous tissues were infiltrated with 1% lidocaine without epinephrine. A small stab incision was made with an 11 blade scalpel, and an 11 gauge Murphy needle was advanced with CT guidance to the posterior cortex. Manual forced was used to advance the needle through the posterior cortex and the stylet was removed. A bone marrow aspirate was retrieved and passed to a cytotechnologist in the room. The Murphy needle was then advanced without the stylet for a core biopsy. The core biopsy was retrieved and also passed to a cytotechnologist.  Manual pressure was used for hemostasis and a sterile dressing was placed.  No complications were encountered no significant blood loss was encountered.  Patient tolerated the procedure well and remained hemodynamically stable throughout.  IMPRESSION: Status post CT-guided  bone marrow biopsy, with tissue specimen sent to pathology for complete histopathologic analysis  Signed,  Dulcy Fanny. Earleen Newport, DO  [facility]  [facility]  Radiology   Electronically Signed   By: Corrie Mckusick D.O.   On: 06/25/2015 11:42    PATHOLOGY:       ASSESSMENT & PLAN:  Persistent Anemia S/P Renal transplant CKD Aranesp growth factor support q 2 weeks Chronic anticoagulation secondary to mechanical St. Jude AVR Chronic Immunosuppression BMBX WNL Hypogonadism Dialysis  He is on dialysis and goes to [facility]. Counts are good with a hemoglobin today of 12.1 gm/dl. He continues on low dose testosterone with good energy and well being.   We will add testosterone to next lab draw and check PSA.  He is due for testosterone today.  In regards to his RCC, he is due for repeat CT imaging next August. We will set him up for that when he returns to clinic.  Follow up in 4 months. Continue with all labs as ordered.  Orders Placed This Encounter  Procedures  . PSA    Standing Status:   Future    Standing Expiration Date:   02/06/2017  . CBC with Differential    Standing Status:   Future    Standing Expiration Date:   02/06/2017  . Comprehensive metabolic panel    Standing Status:   Future    Standing Expiration Date:   02/06/2017  . Testosterone, free    Standing Status:   Future    Standing Expiration Date:   02/06/2017  . Testosterone    Standing Status:   Future    Standing Expiration Date:   02/06/2017  . Sample to Blood Bank    Standing Status:   Future    Standing Expiration Date:   02/06/2017    All questions were answered. The patient knows to call the clinic with any problems, questions or concerns.  This document serves as a record of services personally performed by Ancil Linsey, MD. It was created on her behalf by Elmyra Ricks, a trained medical scribe. The creation of this record is based on the scribe's personal observations and the provider's statements to them. This document has been checked and approved by the attending provider.   I have reviewed the above documentation for accuracy and completeness  and I agree with the above.  This note was electronically signed.  Molli Hazard, MD  02/07/2016 1:19 PM

## 2016-02-07 NOTE — Patient Instructions (Signed)
[place] at [facility] Discharge Instructions  RECOMMENDATIONS MADE BY THE CONSULTANT AND ANY TEST RESULTS WILL BE SENT TO YOUR REFERRING PHYSICIAN.  You saw Dr.Penland today. Follow up in 4 months with labs. Continue testosterone injections. See Amy at checkout for appointments.  Thank you for choosing [place] at [facility] to provide your oncology and hematology care.  To afford each patient quality time with our provider, please arrive at least 15 minutes before your scheduled appointment time.   Beginning January 23rd 2017 lab work for the [facility] will be done in the  Main lab at [facility] on 1st floor. If you have a lab appointment with the [facility] please come in thru the  Main Entrance and check in at the main information desk  You need to re-schedule your appointment should you arrive 10 or more minutes late.  We strive to give you quality time with our providers, and arriving late affects you and other patients whose appointments are after yours.  Also, if you no show three or more times for appointments you may be dismissed from the clinic at the providers discretion.     Again, thank you for choosing [facility].  Our hope is that these requests will decrease the amount of time that you wait before being seen by our physicians.       _____________________________________________________________  Should you have questions after your visit to [facility], please contact our office at [phone] between the hours of 8:30 a.m. and 4:30 p.m.  Voicemails left after 4:30 p.m. will not be returned until the following business day.  For prescription refill requests, have your pharmacy contact our office.         Resources For Cancer Patients and their Caregivers ? American Cancer Society: Can assist with transportation, wigs, general needs, runs Look Good Feel Better.         (732) [phone] ? Cancer Care: Provides financial assistance, online support groups, medication/co-pay assistance.  1-800-813-HOPE 364-032-7926) ? [place] Assists [place] cancer patients and their families through emotional , educational and financial support.  639-139-5969 ? [place] DSS Where to apply for food stamps, Medicaid and utility assistance. 225-223-8118 ? RCATS: Transportation to medical appointments. (773) 546-8690 ? Social Security Administration: May apply for disability if have a Stage IV cancer. 248-292-6720 (786)386-2840 ? [employer], Disability and Transit Services: Assists with nutrition, care and transit needs. [place] Support Programs: @10RELATIVEDAYS @ > Cancer Support Group  2nd Tuesday of the month 1pm-2pm, Journey Room  > Creative Journey  3rd Tuesday of the month 1130am-1pm, Journey Room  > Look Good Feel Better  1st Wednesday of the month 10am-12 noon, Journey Room (Call [place] to register [phone])

## 2016-02-08 ENCOUNTER — Other Ambulatory Visit (HOSPITAL_COMMUNITY): Payer: Medicare Other

## 2016-02-08 ENCOUNTER — Ambulatory Visit (HOSPITAL_COMMUNITY): Payer: Medicare Other

## 2016-02-08 ENCOUNTER — Ambulatory Visit (HOSPITAL_COMMUNITY): Payer: Medicare Other | Admitting: Hematology & Oncology

## 2016-02-08 DIAGNOSIS — N186 End stage renal disease: Secondary | ICD-10-CM | POA: Diagnosis not present

## 2016-02-08 DIAGNOSIS — D509 Iron deficiency anemia, unspecified: Secondary | ICD-10-CM | POA: Diagnosis not present

## 2016-02-08 DIAGNOSIS — N2581 Secondary hyperparathyroidism of renal origin: Secondary | ICD-10-CM | POA: Diagnosis not present

## 2016-02-08 DIAGNOSIS — D631 Anemia in chronic kidney disease: Secondary | ICD-10-CM | POA: Diagnosis not present

## 2016-02-09 LAB — POCT INR: INR: 2.1

## 2016-02-11 ENCOUNTER — Ambulatory Visit (INDEPENDENT_AMBULATORY_CARE_PROVIDER_SITE_OTHER): Payer: Self-pay | Admitting: Pharmacist

## 2016-02-11 DIAGNOSIS — D509 Iron deficiency anemia, unspecified: Secondary | ICD-10-CM | POA: Diagnosis not present

## 2016-02-11 DIAGNOSIS — D631 Anemia in chronic kidney disease: Secondary | ICD-10-CM | POA: Diagnosis not present

## 2016-02-11 DIAGNOSIS — Z5181 Encounter for therapeutic drug level monitoring: Secondary | ICD-10-CM

## 2016-02-11 DIAGNOSIS — Z7901 Long term (current) use of anticoagulants: Secondary | ICD-10-CM

## 2016-02-11 DIAGNOSIS — N186 End stage renal disease: Secondary | ICD-10-CM | POA: Diagnosis not present

## 2016-02-11 DIAGNOSIS — N2581 Secondary hyperparathyroidism of renal origin: Secondary | ICD-10-CM | POA: Diagnosis not present

## 2016-02-11 DIAGNOSIS — Z952 Presence of prosthetic heart valve: Secondary | ICD-10-CM

## 2016-02-11 NOTE — Consult Note (Signed)
Patient checks INR at home with Home INR monitoring.  Billing once per month interupertation fee.  Patient diagnosis - aortic value disorder with replacemetn of aortic valve) Procedure code if G7618

## 2016-02-13 DIAGNOSIS — N186 End stage renal disease: Secondary | ICD-10-CM | POA: Diagnosis not present

## 2016-02-13 DIAGNOSIS — N2581 Secondary hyperparathyroidism of renal origin: Secondary | ICD-10-CM | POA: Diagnosis not present

## 2016-02-13 DIAGNOSIS — N200 Calculus of kidney: Secondary | ICD-10-CM | POA: Diagnosis not present

## 2016-02-13 DIAGNOSIS — D631 Anemia in chronic kidney disease: Secondary | ICD-10-CM | POA: Diagnosis not present

## 2016-02-13 DIAGNOSIS — D509 Iron deficiency anemia, unspecified: Secondary | ICD-10-CM | POA: Diagnosis not present

## 2016-02-15 DIAGNOSIS — D509 Iron deficiency anemia, unspecified: Secondary | ICD-10-CM | POA: Diagnosis not present

## 2016-02-15 DIAGNOSIS — D631 Anemia in chronic kidney disease: Secondary | ICD-10-CM | POA: Diagnosis not present

## 2016-02-15 DIAGNOSIS — N2581 Secondary hyperparathyroidism of renal origin: Secondary | ICD-10-CM | POA: Diagnosis not present

## 2016-02-15 DIAGNOSIS — N186 End stage renal disease: Secondary | ICD-10-CM | POA: Diagnosis not present

## 2016-02-18 DIAGNOSIS — N186 End stage renal disease: Secondary | ICD-10-CM | POA: Diagnosis not present

## 2016-02-18 DIAGNOSIS — D631 Anemia in chronic kidney disease: Secondary | ICD-10-CM | POA: Diagnosis not present

## 2016-02-18 DIAGNOSIS — D509 Iron deficiency anemia, unspecified: Secondary | ICD-10-CM | POA: Diagnosis not present

## 2016-02-18 DIAGNOSIS — N2581 Secondary hyperparathyroidism of renal origin: Secondary | ICD-10-CM | POA: Diagnosis not present

## 2016-02-20 ENCOUNTER — Encounter (HOSPITAL_COMMUNITY): Payer: Self-pay | Admitting: Hematology & Oncology

## 2016-02-20 ENCOUNTER — Ambulatory Visit (INDEPENDENT_AMBULATORY_CARE_PROVIDER_SITE_OTHER): Payer: Self-pay | Admitting: Pharmacist

## 2016-02-20 DIAGNOSIS — Z7901 Long term (current) use of anticoagulants: Secondary | ICD-10-CM

## 2016-02-20 DIAGNOSIS — Z954 Presence of other heart-valve replacement: Secondary | ICD-10-CM | POA: Diagnosis not present

## 2016-02-20 DIAGNOSIS — N186 End stage renal disease: Secondary | ICD-10-CM | POA: Diagnosis not present

## 2016-02-20 DIAGNOSIS — Z5181 Encounter for therapeutic drug level monitoring: Secondary | ICD-10-CM

## 2016-02-20 DIAGNOSIS — D509 Iron deficiency anemia, unspecified: Secondary | ICD-10-CM | POA: Diagnosis not present

## 2016-02-20 DIAGNOSIS — Z952 Presence of prosthetic heart valve: Secondary | ICD-10-CM

## 2016-02-20 DIAGNOSIS — D631 Anemia in chronic kidney disease: Secondary | ICD-10-CM | POA: Diagnosis not present

## 2016-02-20 DIAGNOSIS — N2581 Secondary hyperparathyroidism of renal origin: Secondary | ICD-10-CM | POA: Diagnosis not present

## 2016-02-20 LAB — POCT INR: INR: 2.1

## 2016-02-21 ENCOUNTER — Other Ambulatory Visit (HOSPITAL_COMMUNITY): Payer: Self-pay | Admitting: Oncology

## 2016-02-21 ENCOUNTER — Encounter (HOSPITAL_BASED_OUTPATIENT_CLINIC_OR_DEPARTMENT_OTHER): Payer: Medicare Other

## 2016-02-21 VITALS — BP 142/54 | HR 68 | Temp 98.4°F | Resp 18

## 2016-02-21 DIAGNOSIS — N186 End stage renal disease: Secondary | ICD-10-CM

## 2016-02-21 DIAGNOSIS — N183 Chronic kidney disease, stage 3 unspecified: Secondary | ICD-10-CM

## 2016-02-21 DIAGNOSIS — E291 Testicular hypofunction: Secondary | ICD-10-CM

## 2016-02-21 DIAGNOSIS — D631 Anemia in chronic kidney disease: Secondary | ICD-10-CM

## 2016-02-21 DIAGNOSIS — Z992 Dependence on renal dialysis: Secondary | ICD-10-CM

## 2016-02-21 MED ORDER — TESTOSTERONE CYPIONATE 200 MG/ML IM SOLN
100.0000 mg | INTRAMUSCULAR | Status: DC
  Administered 2016-02-21: 100 mg via INTRAMUSCULAR
  Filled 2016-02-21: qty 1

## 2016-02-21 NOTE — Patient Instructions (Signed)
[place] at [facility] Discharge Instructions  RECOMMENDATIONS MADE BY THE CONSULTANT AND ANY TEST RESULTS WILL BE SENT TO YOUR REFERRING PHYSICIAN.  Received Testosterone injection today. Follow-up as scheduled. Call clinic for any questions or concerns  Thank you for choosing [place] at [facility] to provide your oncology and hematology care.  To afford each patient quality time with our provider, please arrive at least 15 minutes before your scheduled appointment time.   Beginning January 23rd 2017 lab work for the [facility] will be done in the  Main lab at [facility] on 1st floor. If you have a lab appointment with the [facility] please come in thru the  Main Entrance and check in at the main information desk  You need to re-schedule your appointment should you arrive 10 or more minutes late.  We strive to give you quality time with our providers, and arriving late affects you and other patients whose appointments are after yours.  Also, if you no show three or more times for appointments you may be dismissed from the clinic at the providers discretion.     Again, thank you for choosing [facility].  Our hope is that these requests will decrease the amount of time that you wait before being seen by our physicians.       _____________________________________________________________  Should you have questions after your visit to [facility], please contact our office at [phone] between the hours of 8:30 a.m. and 4:30 p.m.  Voicemails left after 4:30 p.m. will not be returned until the following business day.  For prescription refill requests, have your pharmacy contact our office.         Resources For Cancer Patients and their Caregivers ? American Cancer Society: Can assist with transportation, wigs, general needs, runs Look Good Feel Better.        [phone] ? Cancer Care: Provides  financial assistance, online support groups, medication/co-pay assistance.  1-800-813-HOPE 8106037469) ? [place] Assists [place] cancer patients and their families through emotional , educational and financial support.  5750093378 ? [place] DSS Where to apply for food stamps, Medicaid and utility assistance. [phone] ? RCATS: Transportation to medical appointments. 424 297 5953 ? Social Security Administration: May apply for disability if have a Stage IV cancer. 4042035386 [phone] ? [employer], Disability and Transit Services: Assists with nutrition, care and transit needs. [place] Support Programs: @10RELATIVEDAYS @ > Cancer Support Group  2nd Tuesday of the month 1pm-2pm, Journey Room  > Creative Journey  3rd Tuesday of the month 1130am-1pm, Journey Room  > Look Good Feel Better  1st Wednesday of the month 10am-12 noon, Journey Room (Call [place] to register [phone])

## 2016-02-21 NOTE — Progress Notes (Signed)
Jeremy [name] tolerated Testosterone injection well without complaints or incident. VSS Pt discharged self ambulatory in satisfactory condition

## 2016-02-22 ENCOUNTER — Ambulatory Visit (HOSPITAL_COMMUNITY): Payer: Medicare Other

## 2016-02-22 DIAGNOSIS — N2581 Secondary hyperparathyroidism of renal origin: Secondary | ICD-10-CM | POA: Diagnosis not present

## 2016-02-22 DIAGNOSIS — N186 End stage renal disease: Secondary | ICD-10-CM | POA: Diagnosis not present

## 2016-02-22 DIAGNOSIS — D631 Anemia in chronic kidney disease: Secondary | ICD-10-CM | POA: Diagnosis not present

## 2016-02-22 DIAGNOSIS — D509 Iron deficiency anemia, unspecified: Secondary | ICD-10-CM | POA: Diagnosis not present

## 2016-02-24 DIAGNOSIS — D509 Iron deficiency anemia, unspecified: Secondary | ICD-10-CM | POA: Diagnosis not present

## 2016-02-24 DIAGNOSIS — D631 Anemia in chronic kidney disease: Secondary | ICD-10-CM | POA: Diagnosis not present

## 2016-02-24 DIAGNOSIS — N2581 Secondary hyperparathyroidism of renal origin: Secondary | ICD-10-CM | POA: Diagnosis not present

## 2016-02-24 DIAGNOSIS — N186 End stage renal disease: Secondary | ICD-10-CM | POA: Diagnosis not present

## 2016-02-25 ENCOUNTER — Other Ambulatory Visit (HOSPITAL_COMMUNITY): Payer: Self-pay

## 2016-02-25 DIAGNOSIS — C649 Malignant neoplasm of unspecified kidney, except renal pelvis: Secondary | ICD-10-CM

## 2016-02-25 MED ORDER — PREDNISONE 5 MG PO TABS: ORAL_TABLET | ORAL | 0 refills | Status: DC

## 2016-02-25 NOTE — Telephone Encounter (Signed)
Received refill request from patient for prednisone. Refilled per Kirby Crigler, PA-C.

## 2016-02-26 DIAGNOSIS — D509 Iron deficiency anemia, unspecified: Secondary | ICD-10-CM | POA: Diagnosis not present

## 2016-02-26 DIAGNOSIS — N186 End stage renal disease: Secondary | ICD-10-CM | POA: Diagnosis not present

## 2016-02-26 DIAGNOSIS — D631 Anemia in chronic kidney disease: Secondary | ICD-10-CM | POA: Diagnosis not present

## 2016-02-26 DIAGNOSIS — N2581 Secondary hyperparathyroidism of renal origin: Secondary | ICD-10-CM | POA: Diagnosis not present

## 2016-02-28 LAB — POCT INR: INR: 2.3

## 2016-02-29 DIAGNOSIS — D509 Iron deficiency anemia, unspecified: Secondary | ICD-10-CM | POA: Diagnosis not present

## 2016-02-29 DIAGNOSIS — D631 Anemia in chronic kidney disease: Secondary | ICD-10-CM | POA: Diagnosis not present

## 2016-02-29 DIAGNOSIS — N2581 Secondary hyperparathyroidism of renal origin: Secondary | ICD-10-CM | POA: Diagnosis not present

## 2016-02-29 DIAGNOSIS — N186 End stage renal disease: Secondary | ICD-10-CM | POA: Diagnosis not present

## 2016-03-03 ENCOUNTER — Ambulatory Visit (INDEPENDENT_AMBULATORY_CARE_PROVIDER_SITE_OTHER): Payer: Self-pay | Admitting: Pharmacist

## 2016-03-03 ENCOUNTER — Encounter (HOSPITAL_COMMUNITY): Payer: Self-pay | Admitting: Hematology & Oncology

## 2016-03-03 DIAGNOSIS — Z952 Presence of prosthetic heart valve: Secondary | ICD-10-CM

## 2016-03-03 DIAGNOSIS — Z7901 Long term (current) use of anticoagulants: Secondary | ICD-10-CM

## 2016-03-03 DIAGNOSIS — D631 Anemia in chronic kidney disease: Secondary | ICD-10-CM | POA: Diagnosis not present

## 2016-03-03 DIAGNOSIS — N2581 Secondary hyperparathyroidism of renal origin: Secondary | ICD-10-CM | POA: Diagnosis not present

## 2016-03-03 DIAGNOSIS — D509 Iron deficiency anemia, unspecified: Secondary | ICD-10-CM | POA: Diagnosis not present

## 2016-03-03 DIAGNOSIS — N186 End stage renal disease: Secondary | ICD-10-CM | POA: Diagnosis not present

## 2016-03-03 DIAGNOSIS — Z5181 Encounter for therapeutic drug level monitoring: Secondary | ICD-10-CM

## 2016-03-05 ENCOUNTER — Ambulatory Visit (INDEPENDENT_AMBULATORY_CARE_PROVIDER_SITE_OTHER): Payer: Self-pay | Admitting: Pharmacist

## 2016-03-05 ENCOUNTER — Telehealth: Payer: Self-pay | Admitting: Family Medicine

## 2016-03-05 DIAGNOSIS — N2581 Secondary hyperparathyroidism of renal origin: Secondary | ICD-10-CM | POA: Diagnosis not present

## 2016-03-05 DIAGNOSIS — Z5181 Encounter for therapeutic drug level monitoring: Secondary | ICD-10-CM

## 2016-03-05 DIAGNOSIS — Z7901 Long term (current) use of anticoagulants: Secondary | ICD-10-CM

## 2016-03-05 DIAGNOSIS — D631 Anemia in chronic kidney disease: Secondary | ICD-10-CM | POA: Diagnosis not present

## 2016-03-05 DIAGNOSIS — N186 End stage renal disease: Secondary | ICD-10-CM | POA: Diagnosis not present

## 2016-03-05 DIAGNOSIS — Z952 Presence of prosthetic heart valve: Secondary | ICD-10-CM

## 2016-03-05 DIAGNOSIS — D509 Iron deficiency anemia, unspecified: Secondary | ICD-10-CM | POA: Diagnosis not present

## 2016-03-05 LAB — POCT INR: INR: 3.1

## 2016-03-06 DIAGNOSIS — Z992 Dependence on renal dialysis: Secondary | ICD-10-CM | POA: Diagnosis not present

## 2016-03-06 DIAGNOSIS — T8610 Unspecified complication of kidney transplant: Secondary | ICD-10-CM | POA: Diagnosis not present

## 2016-03-06 DIAGNOSIS — N186 End stage renal disease: Secondary | ICD-10-CM | POA: Diagnosis not present

## 2016-03-07 DIAGNOSIS — N186 End stage renal disease: Secondary | ICD-10-CM | POA: Diagnosis not present

## 2016-03-07 DIAGNOSIS — N2581 Secondary hyperparathyroidism of renal origin: Secondary | ICD-10-CM | POA: Diagnosis not present

## 2016-03-07 DIAGNOSIS — D509 Iron deficiency anemia, unspecified: Secondary | ICD-10-CM | POA: Diagnosis not present

## 2016-03-07 NOTE — Telephone Encounter (Signed)
Scheduled

## 2016-03-10 DIAGNOSIS — N186 End stage renal disease: Secondary | ICD-10-CM | POA: Diagnosis not present

## 2016-03-10 DIAGNOSIS — D509 Iron deficiency anemia, unspecified: Secondary | ICD-10-CM | POA: Diagnosis not present

## 2016-03-10 DIAGNOSIS — N2581 Secondary hyperparathyroidism of renal origin: Secondary | ICD-10-CM | POA: Diagnosis not present

## 2016-03-12 ENCOUNTER — Ambulatory Visit (INDEPENDENT_AMBULATORY_CARE_PROVIDER_SITE_OTHER): Payer: 59 | Admitting: Pharmacist

## 2016-03-12 DIAGNOSIS — D509 Iron deficiency anemia, unspecified: Secondary | ICD-10-CM | POA: Diagnosis not present

## 2016-03-12 DIAGNOSIS — Z952 Presence of prosthetic heart valve: Secondary | ICD-10-CM | POA: Diagnosis not present

## 2016-03-12 DIAGNOSIS — Z7901 Long term (current) use of anticoagulants: Secondary | ICD-10-CM

## 2016-03-12 DIAGNOSIS — N186 End stage renal disease: Secondary | ICD-10-CM | POA: Diagnosis not present

## 2016-03-12 DIAGNOSIS — N2581 Secondary hyperparathyroidism of renal origin: Secondary | ICD-10-CM | POA: Diagnosis not present

## 2016-03-12 DIAGNOSIS — Z5181 Encounter for therapeutic drug level monitoring: Secondary | ICD-10-CM

## 2016-03-12 LAB — POCT INR: INR: 2.4

## 2016-03-12 NOTE — Consult Note (Signed)
Patient checks INR at home with Home INR monitoring.  Billing once per month interupertation fee.  Patient diagnosis - Z79.01 and Z95.2 (longer term anticoagulants and presence of prosthetic heart valve) INR was 2.4 today Continue current warfarin dose of 2mg  qd except 3mg  on Sundays and Fridays.   Reminded patient of f/u with PCP 03/25/16 Procedure code if Q1975

## 2016-03-14 DIAGNOSIS — N2581 Secondary hyperparathyroidism of renal origin: Secondary | ICD-10-CM | POA: Diagnosis not present

## 2016-03-14 DIAGNOSIS — D509 Iron deficiency anemia, unspecified: Secondary | ICD-10-CM | POA: Diagnosis not present

## 2016-03-14 DIAGNOSIS — N186 End stage renal disease: Secondary | ICD-10-CM | POA: Diagnosis not present

## 2016-03-17 DIAGNOSIS — N186 End stage renal disease: Secondary | ICD-10-CM | POA: Diagnosis not present

## 2016-03-17 DIAGNOSIS — N2581 Secondary hyperparathyroidism of renal origin: Secondary | ICD-10-CM | POA: Diagnosis not present

## 2016-03-17 DIAGNOSIS — D509 Iron deficiency anemia, unspecified: Secondary | ICD-10-CM | POA: Diagnosis not present

## 2016-03-19 DIAGNOSIS — D509 Iron deficiency anemia, unspecified: Secondary | ICD-10-CM | POA: Diagnosis not present

## 2016-03-19 DIAGNOSIS — N2581 Secondary hyperparathyroidism of renal origin: Secondary | ICD-10-CM | POA: Diagnosis not present

## 2016-03-19 DIAGNOSIS — N186 End stage renal disease: Secondary | ICD-10-CM | POA: Diagnosis not present

## 2016-03-20 ENCOUNTER — Encounter (HOSPITAL_COMMUNITY): Payer: Self-pay

## 2016-03-20 ENCOUNTER — Encounter (HOSPITAL_COMMUNITY): Payer: Medicare Other | Attending: Hematology & Oncology

## 2016-03-20 VITALS — BP 149/51 | HR 64 | Temp 97.9°F | Resp 18

## 2016-03-20 DIAGNOSIS — Z9889 Other specified postprocedural states: Secondary | ICD-10-CM | POA: Insufficient documentation

## 2016-03-20 DIAGNOSIS — Z952 Presence of prosthetic heart valve: Secondary | ICD-10-CM | POA: Insufficient documentation

## 2016-03-20 DIAGNOSIS — I129 Hypertensive chronic kidney disease with stage 1 through stage 4 chronic kidney disease, or unspecified chronic kidney disease: Secondary | ICD-10-CM | POA: Insufficient documentation

## 2016-03-20 DIAGNOSIS — E291 Testicular hypofunction: Secondary | ICD-10-CM

## 2016-03-20 DIAGNOSIS — G4733 Obstructive sleep apnea (adult) (pediatric): Secondary | ICD-10-CM | POA: Insufficient documentation

## 2016-03-20 DIAGNOSIS — N189 Chronic kidney disease, unspecified: Secondary | ICD-10-CM | POA: Insufficient documentation

## 2016-03-20 DIAGNOSIS — Z85528 Personal history of other malignant neoplasm of kidney: Secondary | ICD-10-CM | POA: Insufficient documentation

## 2016-03-20 DIAGNOSIS — E785 Hyperlipidemia, unspecified: Secondary | ICD-10-CM | POA: Insufficient documentation

## 2016-03-20 DIAGNOSIS — D631 Anemia in chronic kidney disease: Secondary | ICD-10-CM

## 2016-03-20 DIAGNOSIS — Z7901 Long term (current) use of anticoagulants: Secondary | ICD-10-CM | POA: Insufficient documentation

## 2016-03-20 DIAGNOSIS — Z940 Kidney transplant status: Secondary | ICD-10-CM | POA: Insufficient documentation

## 2016-03-20 DIAGNOSIS — N183 Chronic kidney disease, stage 3 unspecified: Secondary | ICD-10-CM

## 2016-03-20 DIAGNOSIS — M1990 Unspecified osteoarthritis, unspecified site: Secondary | ICD-10-CM | POA: Insufficient documentation

## 2016-03-20 DIAGNOSIS — N186 End stage renal disease: Secondary | ICD-10-CM

## 2016-03-20 DIAGNOSIS — Z992 Dependence on renal dialysis: Secondary | ICD-10-CM | POA: Insufficient documentation

## 2016-03-20 DIAGNOSIS — Z79899 Other long term (current) drug therapy: Secondary | ICD-10-CM | POA: Insufficient documentation

## 2016-03-20 DIAGNOSIS — D649 Anemia, unspecified: Secondary | ICD-10-CM | POA: Insufficient documentation

## 2016-03-20 MED ORDER — TESTOSTERONE CYPIONATE 200 MG/ML IM SOLN
100.0000 mg | INTRAMUSCULAR | Status: DC
  Administered 2016-03-20: 100 mg via INTRAMUSCULAR
  Filled 2016-03-20: qty 1

## 2016-03-20 NOTE — Progress Notes (Signed)
Jeremy [name] presents today for injection per MD orders. Testosterone cypionate administered IM in right upper buttock. Administration without incident. Patient tolerated well. Vitals stable and discharged home from clinic ambulatory.follow up as scheduled.

## 2016-03-20 NOTE — Patient Instructions (Signed)
[place] at [facility] Discharge Instructions  RECOMMENDATIONS MADE BY THE CONSULTANT AND ANY TEST RESULTS WILL BE SENT TO YOUR REFERRING PHYSICIAN.  Testosterone injection given today. Follow up as scheduled  Thank you for choosing [place] at [facility] to provide your oncology and hematology care.  To afford each patient quality time with our provider, please arrive at least 15 minutes before your scheduled appointment time.   Beginning January 23rd 2017 lab work for the [facility] will be done in the  Main lab at [facility] on 1st floor. If you have a lab appointment with the [facility] please come in thru the  Main Entrance and check in at the main information desk  You need to re-schedule your appointment should you arrive 10 or more minutes late.  We strive to give you quality time with our providers, and arriving late affects you and other patients whose appointments are after yours.  Also, if you no show three or more times for appointments you may be dismissed from the clinic at the providers discretion.     Again, thank you for choosing [facility].  Our hope is that these requests will decrease the amount of time that you wait before being seen by our physicians.       _____________________________________________________________  Should you have questions after your visit to [facility], please contact our office at [phone] between the hours of 8:30 a.m. and 4:30 p.m.  Voicemails left after 4:30 p.m. will not be returned until the following business day.  For prescription refill requests, have your pharmacy contact our office.         Resources For Cancer Patients and their Caregivers ? American Cancer Society: Can assist with transportation, wigs, general needs, runs Look Good Feel Better.        [phone] ? Cancer Care: Provides financial assistance, online support groups,  medication/co-pay assistance.  1-800-813-HOPE (415) 510-6160) ? [place] Assists [place] cancer patients and their families through emotional , educational and financial support.  216-279-5722 ? [place] DSS Where to apply for food stamps, Medicaid and utility assistance. 253-612-1458 ? RCATS: Transportation to medical appointments. [phone] [phone] ? Social Security Administration: May apply for disability if have a Stage IV cancer. [phone] ? [employer], Disability and Transit Services: Assists with nutrition, care and transit needs. [place] Support Programs: @10RELATIVEDAYS @ > Cancer Support Group  2nd Tuesday of the month 1pm-2pm, Journey Room  > Creative Journey  3rd Tuesday of the month 1130am-1pm, Journey Room  > Look Good Feel Better  1st Wednesday of the month 10am-12 noon, Journey Room (Call [place] to register [phone])

## 2016-03-21 DIAGNOSIS — D509 Iron deficiency anemia, unspecified: Secondary | ICD-10-CM | POA: Diagnosis not present

## 2016-03-21 DIAGNOSIS — Z954 Presence of other heart-valve replacement: Secondary | ICD-10-CM | POA: Diagnosis not present

## 2016-03-21 DIAGNOSIS — N2581 Secondary hyperparathyroidism of renal origin: Secondary | ICD-10-CM | POA: Diagnosis not present

## 2016-03-21 DIAGNOSIS — N186 End stage renal disease: Secondary | ICD-10-CM | POA: Diagnosis not present

## 2016-03-21 DIAGNOSIS — Z7901 Long term (current) use of anticoagulants: Secondary | ICD-10-CM | POA: Diagnosis not present

## 2016-03-21 LAB — POCT INR: INR: 2.6

## 2016-03-24 ENCOUNTER — Ambulatory Visit (INDEPENDENT_AMBULATORY_CARE_PROVIDER_SITE_OTHER): Payer: Self-pay | Admitting: Pharmacist

## 2016-03-24 DIAGNOSIS — Z7901 Long term (current) use of anticoagulants: Secondary | ICD-10-CM

## 2016-03-24 DIAGNOSIS — N2581 Secondary hyperparathyroidism of renal origin: Secondary | ICD-10-CM | POA: Diagnosis not present

## 2016-03-24 DIAGNOSIS — Z952 Presence of prosthetic heart valve: Secondary | ICD-10-CM

## 2016-03-24 DIAGNOSIS — Z5181 Encounter for therapeutic drug level monitoring: Secondary | ICD-10-CM

## 2016-03-24 DIAGNOSIS — N186 End stage renal disease: Secondary | ICD-10-CM | POA: Diagnosis not present

## 2016-03-24 DIAGNOSIS — D509 Iron deficiency anemia, unspecified: Secondary | ICD-10-CM | POA: Diagnosis not present

## 2016-03-25 ENCOUNTER — Ambulatory Visit (INDEPENDENT_AMBULATORY_CARE_PROVIDER_SITE_OTHER): Payer: Medicare Other | Admitting: *Deleted

## 2016-03-25 VITALS — BP 144/61 | HR 74 | Ht 71.0 in | Wt 183.0 lb

## 2016-03-25 DIAGNOSIS — Z0000 Encounter for general adult medical examination without abnormal findings: Secondary | ICD-10-CM

## 2016-03-25 NOTE — Progress Notes (Signed)
Subjective:   Jeremy [name] is a 60 y.o. male who presents for an Initial Medicare Annual Wellness Visit. He lives at home with his girlfriend. He has one adult daughter and 2 grandchildren.   Review of Systems   Cardiac Risk Factors include: hypertension;male gender;microalbuminuria;sedentary lifestyle;advanced age (>72men, >66 women)  Jeremy [name] is currently on dialysis 3 days per week. 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   Complains of weakness in hands and legs. Most likely side effect of dialysis and deconditioning.   Approx 54mm circumscribed erythremic lesion on right forearm for the past week. Has applied antibiotic ointment.   Feels that his health is better than last year.   Objective:    Today's Vitals   03/25/16 1539  BP: (!) 144/61  Pulse: 74  Weight: 183 lb (83 kg)  Height: 5\' 11"  (1.803 m)   Body mass index is 25.52 kg/m.  Current Medications (verified) Outpatient Encounter Prescriptions as of 03/25/2016  Medication Sig  . acetaminophen (TYLENOL) 325 MG tablet Take 650 mg by mouth every 6 (six) hours as needed.  Marland Kitchen allopurinol (ZYLOPRIM) 100 MG tablet Take 200 mg by mouth daily.   . Darbepoetin Alfa (ARANESP, ALBUMIN FREE,) 150 MCG/0.3ML SOSY injection Inject 150 mcg into the skin. Every 2 weeks  . dicyclomine (BENTYL) 10 MG capsule Take 2 capsules (20 mg total) by mouth 3 (three) times daily before meals. (Patient taking differently: Take 20 mg by mouth 2 (two) times daily. )  . diphenoxylate-atropine (LOMOTIL) 2.5-0.025 MG tablet Take 1 tablet by mouth 4 (four) times daily as needed for diarrhea or loose stools.  . DOCOSAHEXAENOIC ACID PO Take 2-3 g by mouth.  . folic acid-vitamin b complex-vitamin c-selenium-zinc (DIALYVITE) 3 MG TABS Take 1 tablet by mouth daily.    Marland Kitchen labetalol (NORMODYNE) 300 MG tablet Take 600 mg by mouth 3 (three) times daily.  Marland Kitchen lidocaine-prilocaine (EMLA) cream Apply topically.  Marland Kitchen loratadine  (CLARITIN) 10 MG tablet Take 10 mg by mouth. Reported on 06/12/2015  . magnesium oxide (MAG-OX) 400 (241.3 MG) MG tablet Take 400 mg by mouth 2 (two) times daily.  Marland Kitchen NIFEdipine (PROCARDIA-XL/ADALAT-CC/NIFEDICAL-XL) 30 MG 24 hr tablet Take by mouth.  . nitroGLYCERIN (NITROSTAT) 0.4 MG SL tablet Place 1 tablet (0.4 mg total) under the tongue every 5 (five) minutes as needed for chest pain.  . Omega-3 1000 MG CAPS Take 3 g by mouth.  Marland Kitchen omeprazole (PRILOSEC) 20 MG capsule Take 20 mg by mouth daily.  . pravastatin (PRAVACHOL) 40 MG tablet   . predniSONE (DELTASONE) 5 MG tablet Take 1 pill daily  . Sevelamer Carbonate (RENVELA PO) Take 1 tablet by mouth 3 (three) times daily after meals.  Marland Kitchen sulfamethoxazole-trimethoprim (BACTRIM,SEPTRA) 400-80 MG per tablet Take 1 tablet by mouth 3 (three) times a week.  . tacrolimus (PROGRAF) 1 MG capsule Take 1 mg by mouth at bedtime. Take 2 mg in am and 1 mg in pm  . warfarin (COUMADIN) 1 MG tablet TAKE 1-3 TABLETS BY MOUTH ONCE DAILY AS DIRECTED BY ANTICOAGULATION CLINIC. (Patient taking differently: Takes 2 tabs every other day and 3 tabs rest of time (1mg  per tab).)  . [DISCONTINUED] B Complex Vitamins (VITAMIN-B COMPLEX) TABS Take 1 tablet by mouth.   No facility-administered encounter medications on file as of 03/25/2016.     Allergies (verified) Atorvastatin; Lipitor [atorvastatin calcium]; and Sertraline   History: Past Medical History:  Diagnosis Date  . Anemia   .  Arthritis   . Blood transfusion without reported diagnosis   . Cancer ([place])   . Chronic anticoagulation 06/2012  . Dialysis patient [facility]) 01 01 2008  . Hx of colonoscopy    2009 by Dr. Laural Golden. Normal except for small submucosal lipoma. No evidence of polyps or colitis.  . Hyperlipidemia   . Hypertension   . 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  Marland Kitchen OSA (obstructive sleep apnea)    Past Surgical History:  Procedure Laterality Date  . AORTIC VALVE REPLACEMENT  3 /11 /2014   At [place]    . CORONARY ANGIOPLASTY WITH STENT PLACEMENT    . DG AV DIALYSIS  SHUNT ACCESS EXIST*L* OR     left arm  . KIDNEY TRANSPLANT     Family History  Problem Relation Age of Onset  . Cancer Mother     pancreatic  . Heart disease Father   . Diabetes Father    Social History   Occupational History  . courier     [employer]   Social History Main Topics  . Smoking status: Never Smoker  . Smokeless tobacco: Never Used  . Alcohol use No  . Drug use: No  . Sexual activity: Not on file   Tobacco Counseling No tobacco use  Activities of Daily Living In your present state of health, do you have any difficulty performing the following activities: 03/25/2016  Hearing? Y  Vision? N  Difficulty concentrating or making decisions? Y  Walking or climbing stairs? N  Dressing or bathing? N  Doing errands, shopping? N  Preparing Food and eating ? N  Using the Toilet? N  In the past six months, have you accidently leaked urine? N  Do you have problems with loss of bowel control? N  Managing your Medications? N  Managing your Finances? N  Housekeeping or managing your Housekeeping? N  Some recent data might be hidden    Immunizations and Health Maintenance Immunization History  Administered Date(s) Administered  . Influenza-Unspecified 01/01/2015, 12/25/2015  . Td 04/20/2006   Health Maintenance Due  Topic Date Due  . Hepatitis C Screening  07/27/1955  . HIV Screening  03/12/1972  . COLONOSCOPY  03/13/2007   Patient Care Team: Timmothy Euler, MD as PCP - General (Family Medicine) Warden Fillers, MD as Consulting Physician (Ophthalmology) Patrici Ranks, MD as Consulting Physician (Hematology and Oncology)   Assessment:   This is a  routine wellness examination for Jeremy [name].   Hearing/Vision screen No deficit noted  Dietary issues and exercise activities discussed: Current Exercise Habits: The patient does not participate in regular exercise at present, Exercise limited by: Other - see comments (on dialysis) Generalized weakness from dialysis.  Goals    . Exercise 3x per week (30 min per time)          Try to walk for 30 minutes 3 times a week as tolerated     Reports eating 3 meals per day.   Depression Screen PHQ 2/9 Scores 03/25/2016 11/23/2015 09/28/2015  PHQ - 2 Score 2 5 0  PHQ- 9 Score 5 19 -    Fall Risk Fall Risk  03/25/2016 11/23/2015 09/28/2015  Falls in the past year? No No No    Cognitive Function: MMSE - Mini Mental  State Exam 03/25/2016  Orientation to time 5  Orientation to Place 5  Registration 3  Attention/ Calculation 5  Recall 3  Language- name 2 objects 2  Language- repeat 1  Language- follow 3 step command 3  Language- read & follow direction 1  Write a sentence 1  Copy design 1  Total score 30  Normal exam      Screening Tests Health Maintenance  Topic Date Due  . Hepatitis C Screening  [birth year]  . HIV Screening  03/12/1972  . COLONOSCOPY  03/13/2007  . TETANUS/TDAP  04/20/2016  . INFLUENZA VACCINE  Completed        Plan:   Keep f/u with Dr Wendi Snipes in Feb 2018 and f/u sooner if needed.  Apply OTC clotrimazole to forearm lesion. If worsens or does not improve f/u with provider.  Review advanced directives. Bring a signed, notarized copy to our office.   During the course of the visit Laurice was educated and counseled about the following appropriate screening and preventive services:   Vaccines to include: Influenza-up to date  Electrocardiogram  Colorectal cancer screening-2016 per patient. Will request records  Cardiovascular disease screening-sees cardiology  Diabetes screening-with routine labs  Glaucoma screening-suggested annually. Will schedule  visit  Nutrition counseling  Prostate cancer screening-up to date  Exercise-use stress ball to exercise hands and increase walking as tolerated. Goal of 30 minutes 3 times per week.   Read and do puzzles to exercise the mind   Patient Instructions (the written plan) were given to the patient.   Chong Sicilian, RN  03/25/2016

## 2016-03-25 NOTE — Patient Instructions (Addendum)
  Jeremy [name] , Thank you for taking time to come for your Medicare Wellness Visit. I appreciate your ongoing commitment to your health goals. Please review the following plan we discussed and let me know if I can assist you in the future.   Read and do puzzles to exercise your mind. Try to use stress balls to strengthen your hands.  These are the goals we discussed:  Goals    . Exercise 3x per week (30 min per time)          Try to walk for 30 minutes 3 times a week as tolerated       This is a list of the screening recommended for you and due dates:  Health Maintenance  Topic Date Due  .  Hepatitis C: One time screening is recommended by Center for Disease Control  (CDC) for  adults born from [birth year] through 1964.   04/20/1955  . HIV Screening  03/12/1972  . Colon Cancer Screening  03/13/2007  . Tetanus Vaccine  04/20/2016  . Flu Shot  Completed

## 2016-03-26 DIAGNOSIS — N2581 Secondary hyperparathyroidism of renal origin: Secondary | ICD-10-CM | POA: Diagnosis not present

## 2016-03-26 DIAGNOSIS — N186 End stage renal disease: Secondary | ICD-10-CM | POA: Diagnosis not present

## 2016-03-26 DIAGNOSIS — D509 Iron deficiency anemia, unspecified: Secondary | ICD-10-CM | POA: Diagnosis not present

## 2016-03-27 ENCOUNTER — Ambulatory Visit: Payer: Self-pay | Admitting: Pharmacist

## 2016-03-27 DIAGNOSIS — Z5181 Encounter for therapeutic drug level monitoring: Secondary | ICD-10-CM

## 2016-03-27 DIAGNOSIS — Z952 Presence of prosthetic heart valve: Secondary | ICD-10-CM

## 2016-03-27 DIAGNOSIS — Z7901 Long term (current) use of anticoagulants: Secondary | ICD-10-CM

## 2016-03-27 LAB — POCT INR: INR: 2.2

## 2016-03-28 LAB — POCT INR: INR: 2

## 2016-03-29 DIAGNOSIS — D509 Iron deficiency anemia, unspecified: Secondary | ICD-10-CM | POA: Diagnosis not present

## 2016-03-29 DIAGNOSIS — N2581 Secondary hyperparathyroidism of renal origin: Secondary | ICD-10-CM | POA: Diagnosis not present

## 2016-03-29 DIAGNOSIS — N186 End stage renal disease: Secondary | ICD-10-CM | POA: Diagnosis not present

## 2016-04-01 DIAGNOSIS — N2581 Secondary hyperparathyroidism of renal origin: Secondary | ICD-10-CM | POA: Diagnosis not present

## 2016-04-01 DIAGNOSIS — N186 End stage renal disease: Secondary | ICD-10-CM | POA: Diagnosis not present

## 2016-04-01 DIAGNOSIS — D509 Iron deficiency anemia, unspecified: Secondary | ICD-10-CM | POA: Diagnosis not present

## 2016-04-02 DIAGNOSIS — D509 Iron deficiency anemia, unspecified: Secondary | ICD-10-CM | POA: Diagnosis not present

## 2016-04-02 DIAGNOSIS — N186 End stage renal disease: Secondary | ICD-10-CM | POA: Diagnosis not present

## 2016-04-02 DIAGNOSIS — N2581 Secondary hyperparathyroidism of renal origin: Secondary | ICD-10-CM | POA: Diagnosis not present

## 2016-04-03 ENCOUNTER — Other Ambulatory Visit (HOSPITAL_COMMUNITY): Payer: Self-pay | Admitting: Hematology & Oncology

## 2016-04-03 DIAGNOSIS — C649 Malignant neoplasm of unspecified kidney, except renal pelvis: Secondary | ICD-10-CM

## 2016-04-04 ENCOUNTER — Ambulatory Visit (INDEPENDENT_AMBULATORY_CARE_PROVIDER_SITE_OTHER): Payer: Self-pay | Admitting: Pharmacist

## 2016-04-04 DIAGNOSIS — Z5181 Encounter for therapeutic drug level monitoring: Secondary | ICD-10-CM

## 2016-04-04 DIAGNOSIS — D509 Iron deficiency anemia, unspecified: Secondary | ICD-10-CM | POA: Diagnosis not present

## 2016-04-04 DIAGNOSIS — Z7901 Long term (current) use of anticoagulants: Secondary | ICD-10-CM

## 2016-04-04 DIAGNOSIS — N2581 Secondary hyperparathyroidism of renal origin: Secondary | ICD-10-CM | POA: Diagnosis not present

## 2016-04-04 DIAGNOSIS — Z952 Presence of prosthetic heart valve: Secondary | ICD-10-CM

## 2016-04-04 DIAGNOSIS — N186 End stage renal disease: Secondary | ICD-10-CM | POA: Diagnosis not present

## 2016-04-04 LAB — POCT INR: INR: 2

## 2016-04-06 DIAGNOSIS — N186 End stage renal disease: Secondary | ICD-10-CM | POA: Diagnosis not present

## 2016-04-06 DIAGNOSIS — N2581 Secondary hyperparathyroidism of renal origin: Secondary | ICD-10-CM | POA: Diagnosis not present

## 2016-04-06 DIAGNOSIS — T8610 Unspecified complication of kidney transplant: Secondary | ICD-10-CM | POA: Diagnosis not present

## 2016-04-06 DIAGNOSIS — D509 Iron deficiency anemia, unspecified: Secondary | ICD-10-CM | POA: Diagnosis not present

## 2016-04-06 DIAGNOSIS — N185 Chronic kidney disease, stage 5: Secondary | ICD-10-CM | POA: Diagnosis not present

## 2016-04-06 DIAGNOSIS — Z992 Dependence on renal dialysis: Secondary | ICD-10-CM | POA: Diagnosis not present

## 2016-04-09 DIAGNOSIS — N2581 Secondary hyperparathyroidism of renal origin: Secondary | ICD-10-CM | POA: Diagnosis not present

## 2016-04-09 DIAGNOSIS — D509 Iron deficiency anemia, unspecified: Secondary | ICD-10-CM | POA: Diagnosis not present

## 2016-04-09 DIAGNOSIS — N186 End stage renal disease: Secondary | ICD-10-CM | POA: Diagnosis not present

## 2016-04-09 DIAGNOSIS — D631 Anemia in chronic kidney disease: Secondary | ICD-10-CM | POA: Diagnosis not present

## 2016-04-10 ENCOUNTER — Ambulatory Visit (INDEPENDENT_AMBULATORY_CARE_PROVIDER_SITE_OTHER): Payer: 59 | Admitting: Pharmacist

## 2016-04-10 DIAGNOSIS — Z952 Presence of prosthetic heart valve: Secondary | ICD-10-CM

## 2016-04-10 DIAGNOSIS — Z7901 Long term (current) use of anticoagulants: Secondary | ICD-10-CM

## 2016-04-10 DIAGNOSIS — Z5181 Encounter for therapeutic drug level monitoring: Secondary | ICD-10-CM | POA: Diagnosis not present

## 2016-04-10 LAB — POCT INR: INR: 2.4

## 2016-04-10 NOTE — Consult Note (Signed)
Patient checks INR at home with Home INR monitoring.  Billing once per month interupertation fee.  Patient diagnosis - AVR replacement / chronic anticoagulation Procedure code if G0250

## 2016-04-11 DIAGNOSIS — N186 End stage renal disease: Secondary | ICD-10-CM | POA: Diagnosis not present

## 2016-04-11 DIAGNOSIS — N2581 Secondary hyperparathyroidism of renal origin: Secondary | ICD-10-CM | POA: Diagnosis not present

## 2016-04-11 DIAGNOSIS — D631 Anemia in chronic kidney disease: Secondary | ICD-10-CM | POA: Diagnosis not present

## 2016-04-11 DIAGNOSIS — D509 Iron deficiency anemia, unspecified: Secondary | ICD-10-CM | POA: Diagnosis not present

## 2016-04-14 DIAGNOSIS — D631 Anemia in chronic kidney disease: Secondary | ICD-10-CM | POA: Diagnosis not present

## 2016-04-14 DIAGNOSIS — D509 Iron deficiency anemia, unspecified: Secondary | ICD-10-CM | POA: Diagnosis not present

## 2016-04-14 DIAGNOSIS — N2581 Secondary hyperparathyroidism of renal origin: Secondary | ICD-10-CM | POA: Diagnosis not present

## 2016-04-14 DIAGNOSIS — N186 End stage renal disease: Secondary | ICD-10-CM | POA: Diagnosis not present

## 2016-04-16 DIAGNOSIS — D509 Iron deficiency anemia, unspecified: Secondary | ICD-10-CM | POA: Diagnosis not present

## 2016-04-16 DIAGNOSIS — D631 Anemia in chronic kidney disease: Secondary | ICD-10-CM | POA: Diagnosis not present

## 2016-04-16 DIAGNOSIS — N186 End stage renal disease: Secondary | ICD-10-CM | POA: Diagnosis not present

## 2016-04-16 DIAGNOSIS — N2581 Secondary hyperparathyroidism of renal origin: Secondary | ICD-10-CM | POA: Diagnosis not present

## 2016-04-17 ENCOUNTER — Ambulatory Visit (INDEPENDENT_AMBULATORY_CARE_PROVIDER_SITE_OTHER): Payer: Self-pay | Admitting: Pharmacist

## 2016-04-17 ENCOUNTER — Encounter (HOSPITAL_COMMUNITY): Payer: Self-pay

## 2016-04-17 ENCOUNTER — Encounter (HOSPITAL_COMMUNITY): Payer: 59 | Attending: Hematology & Oncology

## 2016-04-17 VITALS — BP 115/46 | HR 68 | Temp 98.3°F | Resp 16

## 2016-04-17 DIAGNOSIS — Z85528 Personal history of other malignant neoplasm of kidney: Secondary | ICD-10-CM | POA: Insufficient documentation

## 2016-04-17 DIAGNOSIS — N189 Chronic kidney disease, unspecified: Secondary | ICD-10-CM | POA: Insufficient documentation

## 2016-04-17 DIAGNOSIS — M1990 Unspecified osteoarthritis, unspecified site: Secondary | ICD-10-CM | POA: Insufficient documentation

## 2016-04-17 DIAGNOSIS — N183 Chronic kidney disease, stage 3 unspecified: Secondary | ICD-10-CM

## 2016-04-17 DIAGNOSIS — Z7901 Long term (current) use of anticoagulants: Secondary | ICD-10-CM

## 2016-04-17 DIAGNOSIS — I129 Hypertensive chronic kidney disease with stage 1 through stage 4 chronic kidney disease, or unspecified chronic kidney disease: Secondary | ICD-10-CM | POA: Insufficient documentation

## 2016-04-17 DIAGNOSIS — Z9889 Other specified postprocedural states: Secondary | ICD-10-CM | POA: Insufficient documentation

## 2016-04-17 DIAGNOSIS — D631 Anemia in chronic kidney disease: Secondary | ICD-10-CM

## 2016-04-17 DIAGNOSIS — Z5181 Encounter for therapeutic drug level monitoring: Secondary | ICD-10-CM

## 2016-04-17 DIAGNOSIS — E291 Testicular hypofunction: Secondary | ICD-10-CM

## 2016-04-17 DIAGNOSIS — Z992 Dependence on renal dialysis: Secondary | ICD-10-CM | POA: Insufficient documentation

## 2016-04-17 DIAGNOSIS — Z954 Presence of other heart-valve replacement: Secondary | ICD-10-CM | POA: Diagnosis not present

## 2016-04-17 DIAGNOSIS — D649 Anemia, unspecified: Secondary | ICD-10-CM | POA: Insufficient documentation

## 2016-04-17 DIAGNOSIS — E785 Hyperlipidemia, unspecified: Secondary | ICD-10-CM | POA: Insufficient documentation

## 2016-04-17 DIAGNOSIS — N186 End stage renal disease: Secondary | ICD-10-CM

## 2016-04-17 DIAGNOSIS — G4733 Obstructive sleep apnea (adult) (pediatric): Secondary | ICD-10-CM | POA: Insufficient documentation

## 2016-04-17 DIAGNOSIS — Z940 Kidney transplant status: Secondary | ICD-10-CM | POA: Insufficient documentation

## 2016-04-17 DIAGNOSIS — Z952 Presence of prosthetic heart valve: Secondary | ICD-10-CM

## 2016-04-17 DIAGNOSIS — Z79899 Other long term (current) drug therapy: Secondary | ICD-10-CM | POA: Insufficient documentation

## 2016-04-17 LAB — POCT INR: INR: 1.9

## 2016-04-17 MED ORDER — TESTOSTERONE CYPIONATE 200 MG/ML IM SOLN
100.0000 mg | INTRAMUSCULAR | Status: DC
  Administered 2016-04-17: 100 mg via INTRAMUSCULAR
  Filled 2016-04-17: qty 1

## 2016-04-17 NOTE — Patient Instructions (Signed)
[place] at [facility] Discharge Instructions  RECOMMENDATIONS MADE BY THE CONSULTANT AND ANY TEST RESULTS WILL BE SENT TO YOUR REFERRING PHYSICIAN.  You were given a testosterone injection today. Return as scheduled.  Thank you for choosing [place] at [facility] to provide your oncology and hematology care.  To afford each patient quality time with our provider, please arrive at least 15 minutes before your scheduled appointment time.    If you have a lab appointment with the [facility] please come in thru the  Main Entrance and check in at the main information desk  You need to re-schedule your appointment should you arrive 10 or more minutes late.  We strive to give you quality time with our providers, and arriving late affects you and other patients whose appointments are after yours.  Also, if you no show three or more times for appointments you may be dismissed from the clinic at the providers discretion.     Again, thank you for choosing [facility].  Our hope is that these requests will decrease the amount of time that you wait before being seen by our physicians.       _____________________________________________________________  Should you have questions after your visit to [facility], please contact our office at [phone] between the hours of 8:30 a.m. and 4:30 p.m.  Voicemails left after 4:30 p.m. will not be returned until the following business day.  For prescription refill requests, have your pharmacy contact our office.       Resources For Cancer Patients and their Caregivers ? American Cancer Society: Can assist with transportation, wigs, general needs, runs Look Good Feel Better.        8027428240 ? Cancer Care: Provides financial assistance, online support groups, medication/co-pay assistance.  1-800-813-HOPE (562)437-0772) ? [place] Assists [place]  cancer patients and their families through emotional , educational and financial support.  [phone] ? [place] DSS Where to apply for food stamps, Medicaid and utility assistance. 612-555-2266 ? RCATS: Transportation to medical appointments. 608-713-8800 ? Social Security Administration: May apply for disability if have a Stage IV cancer. [phone] ? [employer], Disability and Transit Services: Assists with nutrition, care and transit needs. [place] Support Programs: @10RELATIVEDAYS @ > Cancer Support Group  2nd Tuesday of the month 1pm-2pm, Journey Room  > Creative Journey  3rd Tuesday of the month 1130am-1pm, Journey Room  > Look Good Feel Better  1st Wednesday of the month 10am-12 noon, Journey Room (Call [place] to register [phone])

## 2016-04-17 NOTE — Progress Notes (Signed)
PT HERE TODAY FOR TESTOSTERONE INJECTION. Pt GIVEN INJECTION IN RIGHT UPPER OUTER HIP. PT TOLERATED WELL. PT DISCHARGED HOME AMBULATORY. PT TO RETURN AS SCHEDULED.

## 2016-04-18 DIAGNOSIS — N186 End stage renal disease: Secondary | ICD-10-CM | POA: Diagnosis not present

## 2016-04-18 DIAGNOSIS — N2581 Secondary hyperparathyroidism of renal origin: Secondary | ICD-10-CM | POA: Diagnosis not present

## 2016-04-18 DIAGNOSIS — D509 Iron deficiency anemia, unspecified: Secondary | ICD-10-CM | POA: Diagnosis not present

## 2016-04-18 DIAGNOSIS — D631 Anemia in chronic kidney disease: Secondary | ICD-10-CM | POA: Diagnosis not present

## 2016-04-21 DIAGNOSIS — D509 Iron deficiency anemia, unspecified: Secondary | ICD-10-CM | POA: Diagnosis not present

## 2016-04-21 DIAGNOSIS — N186 End stage renal disease: Secondary | ICD-10-CM | POA: Diagnosis not present

## 2016-04-21 DIAGNOSIS — N2581 Secondary hyperparathyroidism of renal origin: Secondary | ICD-10-CM | POA: Diagnosis not present

## 2016-04-21 DIAGNOSIS — D631 Anemia in chronic kidney disease: Secondary | ICD-10-CM | POA: Diagnosis not present

## 2016-04-23 DIAGNOSIS — N186 End stage renal disease: Secondary | ICD-10-CM | POA: Diagnosis not present

## 2016-04-23 DIAGNOSIS — D631 Anemia in chronic kidney disease: Secondary | ICD-10-CM | POA: Diagnosis not present

## 2016-04-23 DIAGNOSIS — D509 Iron deficiency anemia, unspecified: Secondary | ICD-10-CM | POA: Diagnosis not present

## 2016-04-23 DIAGNOSIS — N2581 Secondary hyperparathyroidism of renal origin: Secondary | ICD-10-CM | POA: Diagnosis not present

## 2016-04-25 DIAGNOSIS — D631 Anemia in chronic kidney disease: Secondary | ICD-10-CM | POA: Diagnosis not present

## 2016-04-25 DIAGNOSIS — N186 End stage renal disease: Secondary | ICD-10-CM | POA: Diagnosis not present

## 2016-04-25 DIAGNOSIS — D509 Iron deficiency anemia, unspecified: Secondary | ICD-10-CM | POA: Diagnosis not present

## 2016-04-25 DIAGNOSIS — N2581 Secondary hyperparathyroidism of renal origin: Secondary | ICD-10-CM | POA: Diagnosis not present

## 2016-04-25 LAB — POCT INR: INR: 2.4

## 2016-04-28 ENCOUNTER — Ambulatory Visit (INDEPENDENT_AMBULATORY_CARE_PROVIDER_SITE_OTHER): Payer: Self-pay | Admitting: Pharmacist

## 2016-04-28 DIAGNOSIS — Z5181 Encounter for therapeutic drug level monitoring: Secondary | ICD-10-CM

## 2016-04-28 DIAGNOSIS — D509 Iron deficiency anemia, unspecified: Secondary | ICD-10-CM | POA: Diagnosis not present

## 2016-04-28 DIAGNOSIS — Z952 Presence of prosthetic heart valve: Secondary | ICD-10-CM

## 2016-04-28 DIAGNOSIS — N2581 Secondary hyperparathyroidism of renal origin: Secondary | ICD-10-CM | POA: Diagnosis not present

## 2016-04-28 DIAGNOSIS — N186 End stage renal disease: Secondary | ICD-10-CM | POA: Diagnosis not present

## 2016-04-28 DIAGNOSIS — D631 Anemia in chronic kidney disease: Secondary | ICD-10-CM | POA: Diagnosis not present

## 2016-04-28 DIAGNOSIS — Z7901 Long term (current) use of anticoagulants: Secondary | ICD-10-CM

## 2016-04-30 DIAGNOSIS — N186 End stage renal disease: Secondary | ICD-10-CM | POA: Diagnosis not present

## 2016-04-30 DIAGNOSIS — D509 Iron deficiency anemia, unspecified: Secondary | ICD-10-CM | POA: Diagnosis not present

## 2016-04-30 DIAGNOSIS — N2581 Secondary hyperparathyroidism of renal origin: Secondary | ICD-10-CM | POA: Diagnosis not present

## 2016-04-30 DIAGNOSIS — D631 Anemia in chronic kidney disease: Secondary | ICD-10-CM | POA: Diagnosis not present

## 2016-05-01 LAB — POCT INR: INR: 2.4

## 2016-05-02 ENCOUNTER — Ambulatory Visit (INDEPENDENT_AMBULATORY_CARE_PROVIDER_SITE_OTHER): Payer: Self-pay | Admitting: Pharmacist

## 2016-05-02 DIAGNOSIS — D509 Iron deficiency anemia, unspecified: Secondary | ICD-10-CM | POA: Diagnosis not present

## 2016-05-02 DIAGNOSIS — Z5181 Encounter for therapeutic drug level monitoring: Secondary | ICD-10-CM

## 2016-05-02 DIAGNOSIS — N2581 Secondary hyperparathyroidism of renal origin: Secondary | ICD-10-CM | POA: Diagnosis not present

## 2016-05-02 DIAGNOSIS — D631 Anemia in chronic kidney disease: Secondary | ICD-10-CM | POA: Diagnosis not present

## 2016-05-02 DIAGNOSIS — Z952 Presence of prosthetic heart valve: Secondary | ICD-10-CM

## 2016-05-02 DIAGNOSIS — N186 End stage renal disease: Secondary | ICD-10-CM | POA: Diagnosis not present

## 2016-05-02 DIAGNOSIS — Z7901 Long term (current) use of anticoagulants: Secondary | ICD-10-CM

## 2016-05-02 NOTE — Consult Note (Signed)
No charge for labs or visit - INR check through home monitoring system  

## 2016-05-05 DIAGNOSIS — N2581 Secondary hyperparathyroidism of renal origin: Secondary | ICD-10-CM | POA: Diagnosis not present

## 2016-05-05 DIAGNOSIS — D631 Anemia in chronic kidney disease: Secondary | ICD-10-CM | POA: Diagnosis not present

## 2016-05-05 DIAGNOSIS — N186 End stage renal disease: Secondary | ICD-10-CM | POA: Diagnosis not present

## 2016-05-05 DIAGNOSIS — D509 Iron deficiency anemia, unspecified: Secondary | ICD-10-CM | POA: Diagnosis not present

## 2016-05-07 DIAGNOSIS — Z992 Dependence on renal dialysis: Secondary | ICD-10-CM | POA: Diagnosis not present

## 2016-05-07 DIAGNOSIS — T8610 Unspecified complication of kidney transplant: Secondary | ICD-10-CM | POA: Diagnosis not present

## 2016-05-07 DIAGNOSIS — D631 Anemia in chronic kidney disease: Secondary | ICD-10-CM | POA: Diagnosis not present

## 2016-05-07 DIAGNOSIS — D509 Iron deficiency anemia, unspecified: Secondary | ICD-10-CM | POA: Diagnosis not present

## 2016-05-07 DIAGNOSIS — N2581 Secondary hyperparathyroidism of renal origin: Secondary | ICD-10-CM | POA: Diagnosis not present

## 2016-05-07 DIAGNOSIS — N186 End stage renal disease: Secondary | ICD-10-CM | POA: Diagnosis not present

## 2016-05-07 LAB — POCT INR: INR: 1.9

## 2016-05-07 NOTE — Progress Notes (Signed)
I have reviewed and agree with the AWV documentation.   Laroy Apple, MD [facility] 05/07/2016, 3:57 PM

## 2016-05-08 ENCOUNTER — Ambulatory Visit (INDEPENDENT_AMBULATORY_CARE_PROVIDER_SITE_OTHER): Payer: 59 | Admitting: Pharmacist

## 2016-05-08 ENCOUNTER — Encounter: Payer: Medicare Other | Admitting: Pharmacist

## 2016-05-08 DIAGNOSIS — Z952 Presence of prosthetic heart valve: Secondary | ICD-10-CM

## 2016-05-08 DIAGNOSIS — Z7901 Long term (current) use of anticoagulants: Secondary | ICD-10-CM

## 2016-05-08 DIAGNOSIS — Z5181 Encounter for therapeutic drug level monitoring: Secondary | ICD-10-CM

## 2016-05-08 NOTE — Progress Notes (Signed)
Patient checks INR at home with Home INR monitoring.   Subjective:     Indication: AVR   Medication changes: no   Objective:    INR Today: 1.9  Current dose: warfarin 1mg  tablets - take 3 tablets (=3mg ) on Sundays and Fridays.  Take 2 tablets (=2mg ) all other days.  Assessment:    Subtherapeutic INR for goal of 2-3   Plan:    1. New dose: take 3mg  instead of 2mg  today then resume regular warfarin dose   2. Next INR: 1 week    Billing once per month interupertation fee.  Patient diagnosis -AVR and long term anticogualtion Procedure code if G0250

## 2016-05-09 DIAGNOSIS — D631 Anemia in chronic kidney disease: Secondary | ICD-10-CM | POA: Diagnosis not present

## 2016-05-09 DIAGNOSIS — N2581 Secondary hyperparathyroidism of renal origin: Secondary | ICD-10-CM | POA: Diagnosis not present

## 2016-05-09 DIAGNOSIS — N186 End stage renal disease: Secondary | ICD-10-CM | POA: Diagnosis not present

## 2016-05-09 DIAGNOSIS — D509 Iron deficiency anemia, unspecified: Secondary | ICD-10-CM | POA: Diagnosis not present

## 2016-05-10 ENCOUNTER — Other Ambulatory Visit (HOSPITAL_COMMUNITY): Payer: Self-pay | Admitting: Hematology & Oncology

## 2016-05-10 DIAGNOSIS — C649 Malignant neoplasm of unspecified kidney, except renal pelvis: Secondary | ICD-10-CM

## 2016-05-12 DIAGNOSIS — D631 Anemia in chronic kidney disease: Secondary | ICD-10-CM | POA: Diagnosis not present

## 2016-05-12 DIAGNOSIS — D509 Iron deficiency anemia, unspecified: Secondary | ICD-10-CM | POA: Diagnosis not present

## 2016-05-12 DIAGNOSIS — N186 End stage renal disease: Secondary | ICD-10-CM | POA: Diagnosis not present

## 2016-05-12 DIAGNOSIS — N2581 Secondary hyperparathyroidism of renal origin: Secondary | ICD-10-CM | POA: Diagnosis not present

## 2016-05-14 DIAGNOSIS — D631 Anemia in chronic kidney disease: Secondary | ICD-10-CM | POA: Diagnosis not present

## 2016-05-14 DIAGNOSIS — D509 Iron deficiency anemia, unspecified: Secondary | ICD-10-CM | POA: Diagnosis not present

## 2016-05-14 DIAGNOSIS — N186 End stage renal disease: Secondary | ICD-10-CM | POA: Diagnosis not present

## 2016-05-14 DIAGNOSIS — N2581 Secondary hyperparathyroidism of renal origin: Secondary | ICD-10-CM | POA: Diagnosis not present

## 2016-05-15 ENCOUNTER — Encounter (HOSPITAL_COMMUNITY): Payer: Medicare Other | Attending: Hematology & Oncology

## 2016-05-15 ENCOUNTER — Other Ambulatory Visit (HOSPITAL_COMMUNITY): Payer: Self-pay | Admitting: Oncology

## 2016-05-15 ENCOUNTER — Encounter: Payer: Self-pay | Admitting: Family Medicine

## 2016-05-15 ENCOUNTER — Encounter (HOSPITAL_COMMUNITY): Payer: Self-pay

## 2016-05-15 VITALS — BP 102/45 | HR 79 | Temp 98.4°F | Resp 18

## 2016-05-15 DIAGNOSIS — G4733 Obstructive sleep apnea (adult) (pediatric): Secondary | ICD-10-CM | POA: Insufficient documentation

## 2016-05-15 DIAGNOSIS — I129 Hypertensive chronic kidney disease with stage 1 through stage 4 chronic kidney disease, or unspecified chronic kidney disease: Secondary | ICD-10-CM | POA: Insufficient documentation

## 2016-05-15 DIAGNOSIS — N186 End stage renal disease: Secondary | ICD-10-CM

## 2016-05-15 DIAGNOSIS — D649 Anemia, unspecified: Secondary | ICD-10-CM | POA: Insufficient documentation

## 2016-05-15 DIAGNOSIS — Z85528 Personal history of other malignant neoplasm of kidney: Secondary | ICD-10-CM | POA: Insufficient documentation

## 2016-05-15 DIAGNOSIS — Z952 Presence of prosthetic heart valve: Secondary | ICD-10-CM | POA: Insufficient documentation

## 2016-05-15 DIAGNOSIS — Z79899 Other long term (current) drug therapy: Secondary | ICD-10-CM | POA: Insufficient documentation

## 2016-05-15 DIAGNOSIS — Z7901 Long term (current) use of anticoagulants: Secondary | ICD-10-CM | POA: Diagnosis not present

## 2016-05-15 DIAGNOSIS — E785 Hyperlipidemia, unspecified: Secondary | ICD-10-CM | POA: Insufficient documentation

## 2016-05-15 DIAGNOSIS — N183 Chronic kidney disease, stage 3 unspecified: Secondary | ICD-10-CM

## 2016-05-15 DIAGNOSIS — Z954 Presence of other heart-valve replacement: Secondary | ICD-10-CM | POA: Diagnosis not present

## 2016-05-15 DIAGNOSIS — E291 Testicular hypofunction: Secondary | ICD-10-CM | POA: Diagnosis present

## 2016-05-15 DIAGNOSIS — N189 Chronic kidney disease, unspecified: Secondary | ICD-10-CM | POA: Insufficient documentation

## 2016-05-15 DIAGNOSIS — Z9889 Other specified postprocedural states: Secondary | ICD-10-CM | POA: Insufficient documentation

## 2016-05-15 DIAGNOSIS — Z992 Dependence on renal dialysis: Secondary | ICD-10-CM | POA: Insufficient documentation

## 2016-05-15 DIAGNOSIS — D631 Anemia in chronic kidney disease: Secondary | ICD-10-CM

## 2016-05-15 DIAGNOSIS — M1990 Unspecified osteoarthritis, unspecified site: Secondary | ICD-10-CM | POA: Insufficient documentation

## 2016-05-15 DIAGNOSIS — Z940 Kidney transplant status: Secondary | ICD-10-CM | POA: Insufficient documentation

## 2016-05-15 LAB — POCT INR: INR: 2.5

## 2016-05-15 MED ORDER — TESTOSTERONE CYPIONATE 200 MG/ML IM SOLN
100.0000 mg | INTRAMUSCULAR | Status: DC
  Administered 2016-05-15: 100 mg via INTRAMUSCULAR
  Filled 2016-05-15: qty 1

## 2016-05-15 NOTE — Progress Notes (Signed)
Jeremy [name] presents today for injection per MD orders. Testosterone 100mg  given IM  in right upper buttocks Administration without incident. Patient tolerated well.  Vitals stable and discharged from clinic ambulatory . Follow up as scheduled.

## 2016-05-15 NOTE — Patient Instructions (Signed)
[place] at [facility] Discharge Instructions  RECOMMENDATIONS MADE BY THE CONSULTANT AND ANY TEST RESULTS WILL BE SENT TO YOUR REFERRING PHYSICIAN.  Testosterone given today. Follow up as scheduled  Thank you for choosing [place] at [facility] to provide your oncology and hematology care.  To afford each patient quality time with our provider, please arrive at least 15 minutes before your scheduled appointment time.    If you have a lab appointment with the [facility] please come in thru the  Main Entrance and check in at the main information desk  You need to re-schedule your appointment should you arrive 10 or more minutes late.  We strive to give you quality time with our providers, and arriving late affects you and other patients whose appointments are after yours.  Also, if you no show three or more times for appointments you may be dismissed from the clinic at the providers discretion.     Again, thank you for choosing [facility].  Our hope is that these requests will decrease the amount of time that you wait before being seen by our physicians.       _____________________________________________________________  Should you have questions after your visit to [facility], please contact our office at [phone] between the hours of 8:30 a.m. and 4:30 p.m.  Voicemails left after 4:30 p.m. will not be returned until the following business day.  For prescription refill requests, have your pharmacy contact our office.       Resources For Cancer Patients and their Caregivers ? American Cancer Society: Can assist with transportation, wigs, general needs, runs Look Good Feel Better.        5712098007 ? Cancer Care: Provides financial assistance, online support groups, medication/co-pay assistance.  1-800-813-HOPE 2157624247) ? [place] Assists [place] cancer patients and  their families through emotional , educational and financial support.  [phone] ? [place] DSS Where to apply for food stamps, Medicaid and utility assistance. 340-247-8758 ? RCATS: Transportation to medical appointments. 819-318-9900 ? Social Security Administration: May apply for disability if have a Stage IV cancer. [phone] [phone] ? [employer], Disability and Transit Services: Assists with nutrition, care and transit needs. [place] Support Programs: @10RELATIVEDAYS @ > Cancer Support Group  2nd Tuesday of the month 1pm-2pm, Journey Room  > Creative Journey  3rd Tuesday of the month 1130am-1pm, Journey Room  > Look Good Feel Better  1st Wednesday of the month 10am-12 noon, Journey Room (Call [place] to register [phone])

## 2016-05-16 ENCOUNTER — Telehealth: Payer: Self-pay | Admitting: Pharmacist

## 2016-05-16 ENCOUNTER — Telehealth: Payer: Self-pay | Admitting: *Deleted

## 2016-05-16 DIAGNOSIS — N186 End stage renal disease: Secondary | ICD-10-CM | POA: Diagnosis not present

## 2016-05-16 DIAGNOSIS — D509 Iron deficiency anemia, unspecified: Secondary | ICD-10-CM | POA: Diagnosis not present

## 2016-05-16 DIAGNOSIS — D631 Anemia in chronic kidney disease: Secondary | ICD-10-CM | POA: Diagnosis not present

## 2016-05-16 DIAGNOSIS — N2581 Secondary hyperparathyroidism of renal origin: Secondary | ICD-10-CM | POA: Diagnosis not present

## 2016-05-16 NOTE — Telephone Encounter (Signed)
Called pt about INR results. Got answering machine. Left message that INR was in range from 2-3. Result was 2.5. Advised pt to continue same warfarin regimen and to continue to adhere to same diet. Instructed pt to call if he has any questions.

## 2016-05-18 ENCOUNTER — Encounter (HOSPITAL_COMMUNITY): Payer: Self-pay | Admitting: Hematology & Oncology

## 2016-05-19 DIAGNOSIS — D631 Anemia in chronic kidney disease: Secondary | ICD-10-CM | POA: Diagnosis not present

## 2016-05-19 DIAGNOSIS — D509 Iron deficiency anemia, unspecified: Secondary | ICD-10-CM | POA: Diagnosis not present

## 2016-05-19 DIAGNOSIS — N186 End stage renal disease: Secondary | ICD-10-CM | POA: Diagnosis not present

## 2016-05-19 DIAGNOSIS — N2581 Secondary hyperparathyroidism of renal origin: Secondary | ICD-10-CM | POA: Diagnosis not present

## 2016-05-20 ENCOUNTER — Encounter: Payer: Self-pay | Admitting: Family Medicine

## 2016-05-20 ENCOUNTER — Ambulatory Visit (INDEPENDENT_AMBULATORY_CARE_PROVIDER_SITE_OTHER): Payer: Medicare Other | Admitting: Family Medicine

## 2016-05-20 VITALS — BP 123/54 | HR 73 | Temp 98.4°F | Ht 71.0 in | Wt 186.4 lb

## 2016-05-20 DIAGNOSIS — I10 Essential (primary) hypertension: Secondary | ICD-10-CM

## 2016-05-20 DIAGNOSIS — N189 Chronic kidney disease, unspecified: Secondary | ICD-10-CM

## 2016-05-20 DIAGNOSIS — N186 End stage renal disease: Secondary | ICD-10-CM | POA: Diagnosis not present

## 2016-05-20 DIAGNOSIS — Z992 Dependence on renal dialysis: Secondary | ICD-10-CM

## 2016-05-20 DIAGNOSIS — D631 Anemia in chronic kidney disease: Secondary | ICD-10-CM

## 2016-05-20 NOTE — Progress Notes (Signed)
   HPI  Patient presents today for routine follow-up.  Patient has a history of renal transplant with failure, he is now on HD Monday, Wednesday, Friday.  He was previously doing much better as it pertains to his anemia of chronic renal disease prior to Ht. He feels the testosterone which is prescribed by oncology is doing very well.  He has no problems with his current medications, has good medication compliance.  PMH: Smoking status noted ROS: Per HPI  Objective: BP (!) 123/54   Pulse 73   Temp 98.4 F (36.9 C) (Oral)   Ht 5\' 11"  (1.803 m)   Wt 186 lb 6.4 oz (84.6 kg)   BMI 26.00 kg/m  Gen: NAD, alert, cooperative with exam HEENT: NCAT CV: RRR, good S1/S2, no murmur Resp: CTABL, no wheezes, non-labored Ext: No edema, warm- hemosiderin staining on the right lower extremity Neuro: Alert and oriented, No gross deficits  Assessment and plan:  # Hypertension Well-controlled on high-dose beta blocker, no changes  # Anemia of chronic renal disease Stable Managed primarily by hematology and nephrology Patient subjectively feeling more fatigue and feels this is due to his lower hemoglobin.  # ESRD Worsened since last visit, now back on HD states he is doing well, however does have severe fatigue after dialysis. Patient is open to another transplant, he's been recently placed back on the transplant list.    Laroy Apple, MD [facility] 05/20/2016, 5:29 PM

## 2016-05-20 NOTE — Patient Instructions (Signed)
Great to see you!  Consider Seeing Dr. Thersa Salt, I think you will do well with him.

## 2016-05-21 DIAGNOSIS — D509 Iron deficiency anemia, unspecified: Secondary | ICD-10-CM | POA: Diagnosis not present

## 2016-05-21 DIAGNOSIS — D631 Anemia in chronic kidney disease: Secondary | ICD-10-CM | POA: Diagnosis not present

## 2016-05-21 DIAGNOSIS — N186 End stage renal disease: Secondary | ICD-10-CM | POA: Diagnosis not present

## 2016-05-21 DIAGNOSIS — N2581 Secondary hyperparathyroidism of renal origin: Secondary | ICD-10-CM | POA: Diagnosis not present

## 2016-05-22 ENCOUNTER — Encounter: Payer: Self-pay | Admitting: Family Medicine

## 2016-05-22 LAB — PROTIME-INR: INR: 2.2 — AB (ref 0.9–1.1)

## 2016-05-22 LAB — POCT INR: INR: 2.2

## 2016-05-23 ENCOUNTER — Ambulatory Visit (INDEPENDENT_AMBULATORY_CARE_PROVIDER_SITE_OTHER): Payer: Self-pay | Admitting: Pharmacist

## 2016-05-23 DIAGNOSIS — D509 Iron deficiency anemia, unspecified: Secondary | ICD-10-CM | POA: Diagnosis not present

## 2016-05-23 DIAGNOSIS — D631 Anemia in chronic kidney disease: Secondary | ICD-10-CM | POA: Diagnosis not present

## 2016-05-23 DIAGNOSIS — Z5181 Encounter for therapeutic drug level monitoring: Secondary | ICD-10-CM

## 2016-05-23 DIAGNOSIS — N186 End stage renal disease: Secondary | ICD-10-CM | POA: Diagnosis not present

## 2016-05-23 DIAGNOSIS — Z7901 Long term (current) use of anticoagulants: Secondary | ICD-10-CM

## 2016-05-23 DIAGNOSIS — Z952 Presence of prosthetic heart valve: Secondary | ICD-10-CM

## 2016-05-23 DIAGNOSIS — N2581 Secondary hyperparathyroidism of renal origin: Secondary | ICD-10-CM | POA: Diagnosis not present

## 2016-05-23 NOTE — Progress Notes (Signed)
Patient ID: Jeremy [name], male   DOB: Aug 13, 1955, 61 y.o.   MRN: [id number]  No charge for labs or visit - INR check through home monitoring system

## 2016-05-25 ENCOUNTER — Other Ambulatory Visit (HOSPITAL_COMMUNITY): Payer: Self-pay | Admitting: Hematology & Oncology

## 2016-05-25 DIAGNOSIS — C649 Malignant neoplasm of unspecified kidney, except renal pelvis: Secondary | ICD-10-CM

## 2016-05-26 DIAGNOSIS — N2581 Secondary hyperparathyroidism of renal origin: Secondary | ICD-10-CM | POA: Diagnosis not present

## 2016-05-26 DIAGNOSIS — D631 Anemia in chronic kidney disease: Secondary | ICD-10-CM | POA: Diagnosis not present

## 2016-05-26 DIAGNOSIS — N186 End stage renal disease: Secondary | ICD-10-CM | POA: Diagnosis not present

## 2016-05-26 DIAGNOSIS — D509 Iron deficiency anemia, unspecified: Secondary | ICD-10-CM | POA: Diagnosis not present

## 2016-05-28 DIAGNOSIS — N2581 Secondary hyperparathyroidism of renal origin: Secondary | ICD-10-CM | POA: Diagnosis not present

## 2016-05-28 DIAGNOSIS — N186 End stage renal disease: Secondary | ICD-10-CM | POA: Diagnosis not present

## 2016-05-28 DIAGNOSIS — D509 Iron deficiency anemia, unspecified: Secondary | ICD-10-CM | POA: Diagnosis not present

## 2016-05-28 DIAGNOSIS — D631 Anemia in chronic kidney disease: Secondary | ICD-10-CM | POA: Diagnosis not present

## 2016-05-29 LAB — POCT INR: INR: 2.3

## 2016-05-30 ENCOUNTER — Ambulatory Visit (INDEPENDENT_AMBULATORY_CARE_PROVIDER_SITE_OTHER): Payer: Self-pay | Admitting: Pharmacist

## 2016-05-30 DIAGNOSIS — Z5181 Encounter for therapeutic drug level monitoring: Secondary | ICD-10-CM

## 2016-05-30 DIAGNOSIS — D631 Anemia in chronic kidney disease: Secondary | ICD-10-CM | POA: Diagnosis not present

## 2016-05-30 DIAGNOSIS — D509 Iron deficiency anemia, unspecified: Secondary | ICD-10-CM | POA: Diagnosis not present

## 2016-05-30 DIAGNOSIS — N186 End stage renal disease: Secondary | ICD-10-CM | POA: Diagnosis not present

## 2016-05-30 DIAGNOSIS — Z7901 Long term (current) use of anticoagulants: Secondary | ICD-10-CM

## 2016-05-30 DIAGNOSIS — Z952 Presence of prosthetic heart valve: Secondary | ICD-10-CM

## 2016-05-30 DIAGNOSIS — N2581 Secondary hyperparathyroidism of renal origin: Secondary | ICD-10-CM | POA: Diagnosis not present

## 2016-05-30 NOTE — Care Management Note (Signed)
INR checked at home. No charge.

## 2016-06-02 DIAGNOSIS — N2581 Secondary hyperparathyroidism of renal origin: Secondary | ICD-10-CM | POA: Diagnosis not present

## 2016-06-02 DIAGNOSIS — D509 Iron deficiency anemia, unspecified: Secondary | ICD-10-CM | POA: Diagnosis not present

## 2016-06-02 DIAGNOSIS — N186 End stage renal disease: Secondary | ICD-10-CM | POA: Diagnosis not present

## 2016-06-02 DIAGNOSIS — D631 Anemia in chronic kidney disease: Secondary | ICD-10-CM | POA: Diagnosis not present

## 2016-06-04 DIAGNOSIS — N2581 Secondary hyperparathyroidism of renal origin: Secondary | ICD-10-CM | POA: Diagnosis not present

## 2016-06-04 DIAGNOSIS — N186 End stage renal disease: Secondary | ICD-10-CM | POA: Diagnosis not present

## 2016-06-04 DIAGNOSIS — D509 Iron deficiency anemia, unspecified: Secondary | ICD-10-CM | POA: Diagnosis not present

## 2016-06-04 DIAGNOSIS — Z992 Dependence on renal dialysis: Secondary | ICD-10-CM | POA: Diagnosis not present

## 2016-06-04 DIAGNOSIS — T8610 Unspecified complication of kidney transplant: Secondary | ICD-10-CM | POA: Diagnosis not present

## 2016-06-04 DIAGNOSIS — D631 Anemia in chronic kidney disease: Secondary | ICD-10-CM | POA: Diagnosis not present

## 2016-06-05 LAB — POCT INR: INR: 2.8

## 2016-06-06 ENCOUNTER — Ambulatory Visit (INDEPENDENT_AMBULATORY_CARE_PROVIDER_SITE_OTHER): Payer: 59 | Admitting: Pharmacist

## 2016-06-06 DIAGNOSIS — N2581 Secondary hyperparathyroidism of renal origin: Secondary | ICD-10-CM | POA: Diagnosis not present

## 2016-06-06 DIAGNOSIS — Z952 Presence of prosthetic heart valve: Secondary | ICD-10-CM

## 2016-06-06 DIAGNOSIS — Z7901 Long term (current) use of anticoagulants: Secondary | ICD-10-CM | POA: Diagnosis not present

## 2016-06-06 DIAGNOSIS — N186 End stage renal disease: Secondary | ICD-10-CM | POA: Diagnosis not present

## 2016-06-06 DIAGNOSIS — Z5181 Encounter for therapeutic drug level monitoring: Secondary | ICD-10-CM

## 2016-06-06 DIAGNOSIS — D631 Anemia in chronic kidney disease: Secondary | ICD-10-CM | POA: Diagnosis not present

## 2016-06-06 NOTE — Progress Notes (Signed)
Patient checks INR at home with Home INR monitoring.   INR was 2.8.  No medication changes.   Anticoagulation Dose Instructions as of 06/06/2016      Dorene Grebe Tue Wed Thu Fri Sat   New Dose 3 mg 2 mg 2 mg 2 mg 2 mg 3 mg 2 mg    Description   Continue current warfarin dose of 3mg  sundays and fridays.  Take 2mg  all other days.       Billing once per month interupertation fee.  Patient diagnosis presence of prosthetic heart valve and long term anticoagulation therapy Procedure code if G0250

## 2016-06-09 ENCOUNTER — Encounter (HOSPITAL_BASED_OUTPATIENT_CLINIC_OR_DEPARTMENT_OTHER): Payer: Medicare Other

## 2016-06-09 ENCOUNTER — Encounter (HOSPITAL_COMMUNITY): Payer: Self-pay

## 2016-06-09 ENCOUNTER — Encounter (HOSPITAL_COMMUNITY): Payer: Medicare Other | Attending: Oncology

## 2016-06-09 ENCOUNTER — Encounter (HOSPITAL_BASED_OUTPATIENT_CLINIC_OR_DEPARTMENT_OTHER): Payer: Medicare Other | Admitting: Oncology

## 2016-06-09 VITALS — BP 187/67 | HR 65 | Temp 98.1°F | Resp 18 | Wt 191.6 lb

## 2016-06-09 DIAGNOSIS — Z7901 Long term (current) use of anticoagulants: Secondary | ICD-10-CM | POA: Insufficient documentation

## 2016-06-09 DIAGNOSIS — E291 Testicular hypofunction: Secondary | ICD-10-CM | POA: Diagnosis present

## 2016-06-09 DIAGNOSIS — Z85528 Personal history of other malignant neoplasm of kidney: Secondary | ICD-10-CM | POA: Insufficient documentation

## 2016-06-09 DIAGNOSIS — D631 Anemia in chronic kidney disease: Secondary | ICD-10-CM

## 2016-06-09 DIAGNOSIS — G4733 Obstructive sleep apnea (adult) (pediatric): Secondary | ICD-10-CM | POA: Diagnosis not present

## 2016-06-09 DIAGNOSIS — Z940 Kidney transplant status: Secondary | ICD-10-CM | POA: Diagnosis not present

## 2016-06-09 DIAGNOSIS — N189 Chronic kidney disease, unspecified: Secondary | ICD-10-CM | POA: Diagnosis not present

## 2016-06-09 DIAGNOSIS — Z9889 Other specified postprocedural states: Secondary | ICD-10-CM | POA: Diagnosis not present

## 2016-06-09 DIAGNOSIS — N183 Chronic kidney disease, stage 3 unspecified: Secondary | ICD-10-CM

## 2016-06-09 DIAGNOSIS — Z79899 Other long term (current) drug therapy: Secondary | ICD-10-CM | POA: Diagnosis not present

## 2016-06-09 DIAGNOSIS — I129 Hypertensive chronic kidney disease with stage 1 through stage 4 chronic kidney disease, or unspecified chronic kidney disease: Secondary | ICD-10-CM | POA: Insufficient documentation

## 2016-06-09 DIAGNOSIS — Z992 Dependence on renal dialysis: Secondary | ICD-10-CM

## 2016-06-09 DIAGNOSIS — D649 Anemia, unspecified: Secondary | ICD-10-CM | POA: Diagnosis not present

## 2016-06-09 DIAGNOSIS — N186 End stage renal disease: Secondary | ICD-10-CM

## 2016-06-09 DIAGNOSIS — Z952 Presence of prosthetic heart valve: Secondary | ICD-10-CM | POA: Insufficient documentation

## 2016-06-09 DIAGNOSIS — C649 Malignant neoplasm of unspecified kidney, except renal pelvis: Secondary | ICD-10-CM

## 2016-06-09 DIAGNOSIS — M1990 Unspecified osteoarthritis, unspecified site: Secondary | ICD-10-CM | POA: Diagnosis not present

## 2016-06-09 DIAGNOSIS — E785 Hyperlipidemia, unspecified: Secondary | ICD-10-CM | POA: Diagnosis not present

## 2016-06-09 LAB — COMPREHENSIVE METABOLIC PANEL
ALBUMIN: 3.4 g/dL — AB (ref 3.5–5.0)
ALT: 21 U/L (ref 17–63)
AST: 27 U/L (ref 15–41)
Alkaline Phosphatase: 189 U/L — ABNORMAL HIGH (ref 38–126)
Anion gap: 13 (ref 5–15)
BUN: 68 mg/dL — AB (ref 6–20)
CHLORIDE: 102 mmol/L (ref 101–111)
CO2: 25 mmol/L (ref 22–32)
CREATININE: 11.2 mg/dL — AB (ref 0.61–1.24)
Calcium: 8.6 mg/dL — ABNORMAL LOW (ref 8.9–10.3)
GFR calc non Af Amer: 4 mL/min — ABNORMAL LOW (ref >60)
GFR, EST AFRICAN AMERICAN: 5 mL/min — AB (ref >60)
GLUCOSE: 112 mg/dL — AB (ref 65–99)
Potassium: 4.4 mmol/L (ref 3.5–5.1)
SODIUM: 140 mmol/L (ref 135–145)
Total Bilirubin: 0.5 mg/dL (ref 0.3–1.2)
Total Protein: 7.2 g/dL (ref 6.5–8.1)

## 2016-06-09 LAB — CBC WITH DIFFERENTIAL/PLATELET
Basophils Absolute: 0 10*3/uL (ref 0.0–0.1)
Basophils Relative: 1 %
EOS PCT: 10 %
Eosinophils Absolute: 0.5 10*3/uL (ref 0.0–0.7)
HEMATOCRIT: 29.5 % — AB (ref 39.0–52.0)
Hemoglobin: 9.7 g/dL — ABNORMAL LOW (ref 13.0–17.0)
LYMPHS PCT: 38 %
Lymphs Abs: 2 10*3/uL (ref 0.7–4.0)
MCH: 34.9 pg — ABNORMAL HIGH (ref 26.0–34.0)
MCHC: 32.9 g/dL (ref 30.0–36.0)
MCV: 106.1 fL — ABNORMAL HIGH (ref 78.0–100.0)
MONO ABS: 0.4 10*3/uL (ref 0.1–1.0)
MONOS PCT: 7 %
NEUTROS ABS: 2.3 10*3/uL (ref 1.7–7.7)
Neutrophils Relative %: 44 %
PLATELETS: 144 10*3/uL — AB (ref 150–400)
RBC: 2.78 MIL/uL — ABNORMAL LOW (ref 4.22–5.81)
RDW: 15.8 % — AB (ref 11.5–15.5)
WBC: 5.2 10*3/uL (ref 4.0–10.5)

## 2016-06-09 MED ORDER — TESTOSTERONE CYPIONATE 200 MG/ML IM SOLN
INTRAMUSCULAR | Status: AC
  Filled 2016-06-09: qty 1

## 2016-06-09 MED ORDER — TESTOSTERONE CYPIONATE 200 MG/ML IM SOLN
100.0000 mg | INTRAMUSCULAR | Status: DC
  Administered 2016-06-09: 100 mg via INTRAMUSCULAR

## 2016-06-09 NOTE — Progress Notes (Signed)
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 Moderate arterionephrosclerosis with focal features of accelerated hypertension related injury, moderate diabetic nephropathy, recurrent IgA nephropathy Dialysis  HISTORY OF PRESENTING ILLNESS:  Jeremy [name] 61 y.o. male is here for follow-up of anemia secondary to CKD, hypogonadism.  His dialysis has been going well, but he does have some weakness and cramping. There is also a lot of joint pain, especially in his hands.   Pt has some fatigue. His Hgb last week was 10.3. When his Hgb was up to 12, his bruises went away, but his easy bruising has returned since his Hgb has come down. He continues to get Procrit injections; his last was 3 weeks ago. Denies abdominal pain, loss of appetite, or any other concerns.   MEDICAL HISTORY:  Past Medical History:  Diagnosis Date  . Anemia   . Arthritis   . Blood transfusion without reported diagnosis   . Cancer ([place])   . Chronic anticoagulation 06/2012  . Dialysis patient [facility]) 01 01 2008  . Hx of colonoscopy    2009 by Dr. Laural Golden. Normal except for small submucosal lipoma. No evidence of polyps or colitis.  . Hyperlipidemia   . Hypertension   . Kidney disease    Chronic kidney disease secondary to IgA nephropathy diagnosed when he was 76. Kidney transplant in 2000  and renal cell carcinoma and transplanted kidney removed along with his own diseased kidneys.  Marland Kitchen OSA (obstructive sleep apnea)     SURGICAL HISTORY: Past Surgical History:  Procedure Laterality Date  . AORTIC VALVE REPLACEMENT  3 /11 /2014   At [facility]    . CORONARY ANGIOPLASTY WITH STENT PLACEMENT    . DG AV DIALYSIS  SHUNT ACCESS EXIST*L* OR     left arm  . KIDNEY TRANSPLANT      SOCIAL HISTORY: Social History   Social History  . Marital status: Divorced    Spouse name: N/A  . Number of children: N/A  . Years of education: N/A   Occupational History  . courier     [employer]   Social History Main Topics  . Smoking status: Never Smoker  . Smokeless tobacco: Never Used  . Alcohol use No  . Drug use: No  . Sexual activity: Not on file   Other Topics Concern  . Not on file   Social History Narrative   Single   1 daughter 24    Divorced 42 y/o daughter; son killed in a car accident when he was 82, a while back 2 grandsons, both aged 36 Don't smoke, don't drink; "cuss a little bit, but other than that ..." Used to work for [employer], but transplant has cost him everything No energy, dialysis  No hobbies right now; no energy to do anything Used to like doing woodworking; furniture, cabinets, keeping busy Likes fixing and flipping cars  FAMILY HISTORY: Family History  Problem Relation Age  of Onset  . Cancer Mother     pancreatic  . Heart disease Father   . Diabetes Father    indicated that his mother is deceased. He indicated that his father is alive. He reported the following about his sister: health unknown.He reported the following about his brother: unknown.He indicated that his daughter is alive. He indicated that his other is alive.   Father still living; 1-80 Not healthy, has a touch of dementia and heart issues  Sister died of kidney issues when she was very young Other than that, no one with  kidney problems  Mother died with cancer; maybe pancreatic, unsure of type; aged 68-53  Brother in perfect health, aged 12  ALLERGIES:  is allergic to atorvastatin and sertraline.  MEDICATIONS:  Current Outpatient Prescriptions  Medication Sig Dispense Refill  . acetaminophen (TYLENOL) 325 MG tablet Take 650 mg by mouth every 6 (six) hours as needed.    Marland Kitchen allopurinol (ZYLOPRIM) 100 MG tablet Take 200 mg by mouth daily.     . Darbepoetin Alfa (ARANESP, ALBUMIN FREE,) 150 MCG/0.3ML SOSY injection Inject 150 mcg into the skin. Every 2 weeks    . dicyclomine (BENTYL) 10 MG capsule Take 2 capsules (20 mg total) by mouth 3 (three) times daily before meals. (Patient taking differently: Take 20 mg by mouth 2 (two) times daily. ) 90 capsule 4  . diphenoxylate-atropine (LOMOTIL) 2.5-0.025 MG tablet Take 1 tablet by mouth 4 (four) times daily as needed for diarrhea or loose stools. 60 tablet 0  . DOCOSAHEXAENOIC ACID PO Take 2-3 g by mouth.    . folic acid-vitamin b complex-vitamin c-selenium-zinc (DIALYVITE) 3 MG TABS Take 1 tablet by mouth daily.      Marland Kitchen labetalol (NORMODYNE) 300 MG tablet Take 600 mg by mouth 3 (three) times daily.    Marland Kitchen lidocaine-prilocaine (EMLA) cream Apply topically.    Marland Kitchen loratadine (CLARITIN) 10 MG tablet Take 10 mg by mouth. Reported on 06/12/2015    . magnesium oxide (MAG-OX) 400 (241.3 MG) MG tablet Take 400 mg by mouth 2 (two) times daily.  1  . NIFEdipine (PROCARDIA-XL/ADALAT-CC/NIFEDICAL-XL) 30 MG 24 hr tablet Take by mouth.    . nitroGLYCERIN (NITROSTAT) 0.4 MG SL tablet Place 1 tablet (0.4 mg total) under the tongue every 5 (five) minutes as needed for chest pain. 25 tablet 2  . Omega-3 1000 MG CAPS Take 3 g by mouth.    Marland Kitchen omeprazole (PRILOSEC) 20 MG capsule Take 20 mg by mouth daily.    . pravastatin (PRAVACHOL) 40 MG tablet TAKE 1 TABLET BY MOUTH EVERY DAY    . predniSONE (DELTASONE) 5 MG tablet TAKE 1 TABLET BY MOUTH DAILY 30 tablet 0  . Sevelamer Carbonate (RENVELA  PO) Take 1 tablet by mouth 3 (three) times daily after meals.    Marland Kitchen sulfamethoxazole-trimethoprim (BACTRIM,SEPTRA) 400-80 MG per tablet Take 1 tablet by mouth 3 (three) times a week.    . warfarin (COUMADIN) 1 MG tablet TAKE 1-3 TABLETS BY MOUTH ONCE DAILY AS DIRECTED BY ANTICOAGULATION CLINIC. (Patient taking differently: Takes 2 tabs every other day and 3 tabs rest of time (1mg  per tab).) 100 tablet 2   No current facility-administered medications for this visit.    Review of Systems  Constitutional: Positive for malaise/fatigue.       No loss of appetite.   HENT: Negative.   Eyes: Negative.   Respiratory: Negative.   Cardiovascular: Negative.   Gastrointestinal: Negative.  Negative for abdominal pain.  Genitourinary:  Negative.   Musculoskeletal: Positive for joint pain.       Cramping/aching whole body  Skin: Negative.   Neurological: Positive for weakness.  Endo/Heme/Allergies: Bruises/bleeds easily.  Psychiatric/Behavioral: Negative.   All other systems reviewed and are negative. 14 point ROS was done and is otherwise as detailed above or in HPI  PHYSICAL EXAMINATION: ECOG PERFORMANCE STATUS: 1 - Symptomatic but completely ambulatory   Wt 191 lb 9.6 oz (86.9 kg)   BMI 26.72 kg/m  BP (!) 187/67 (BP Location: Left Arm, Patient Position: Sitting)   Pulse 65   Temp 98.1 F (36.7 C) (Oral)   Resp 18   Wt 191 lb 9.6 oz (86.9 kg)   SpO2 99%   BMI 26.72 kg/m   Physical Exam  Constitutional: He is oriented to person, place, and time and well-developed, well-nourished, and in no distress. No distress.  HENT:  Head: Normocephalic and atraumatic.  Mouth/Throat: Oropharynx is clear and moist. No oropharyngeal exudate.  Eyes: Conjunctivae and EOM are normal. Pupils are equal, round, and reactive to light. No scleral icterus.  Neck: Normal range of motion. Neck supple. No JVD present.  Cardiovascular: Normal rate, regular rhythm and normal heart sounds.  Exam reveals no gallop and  no friction rub.   No murmur heard. Pulmonary/Chest: Effort normal and breath sounds normal. No respiratory distress. He has no wheezes. He has no rales.  Abdominal: Soft. Bowel sounds are normal. He exhibits no distension. There is no tenderness. There is no guarding.  Central abdominal hernia  Musculoskeletal: Normal range of motion. He exhibits no edema or tenderness.  Lymphadenopathy:    He has no cervical adenopathy.  Neurological: He is alert and oriented to person, place, and time. No cranial nerve deficit. Gait normal.  Skin: Skin is warm and dry. No rash noted. No erythema. No pallor.  Psychiatric: Affect and judgment normal.  Nursing note and vitals reviewed.  LABORATORY DATA:  I have reviewed the data as listed  CBC Latest Ref Rng & Units 02/07/2016 12/28/2015 12/14/2015  WBC 4.0 - 10.5 K/uL - 5.6 5.8  Hemoglobin 13.0 - 17.0 g/dL 12.1(L) 8.4(L) 10.4(L)  Hematocrit 39.0 - 52.0 % - 27.7(L) 33.7(L)  Platelets 150 - 400 K/uL - 133(L) 112(L)   CMP Latest Ref Rng & Units 12/14/2015 12/14/2015 11/16/2015  Glucose 65 - 99 mg/dL 101(H) - 133(H)  BUN 6 - 20 mg/dL 58(H) - 45(H)  Creatinine 0.61 - 1.24 mg/dL 3.97(H) - 3.50(H)  Sodium 135 - 145 mmol/L 137 - 141  Potassium 3.5 - 5.1 mmol/L 3.3(L) - 3.6  Chloride 101 - 111 mmol/L 107 - 107  CO2 22 - 32 mmol/L 24 - 26  Calcium 8.7 - 10.2 mg/dL 8.8(L) 9.2 9.2  Total Protein 6.5 - 8.1 g/dL 6.5 - 6.0(L)  Total Bilirubin 0.3 - 1.2 mg/dL 0.4 - 0.5  Alkaline Phos 38 - 126 U/L 211(H) - 143(H)  AST 15 - 41 U/L 47(H) - 34  ALT 17 - 63 U/L 43 - 24   RADIOLOGY:  I have personally reviewed the data as listed below.  No new imaging  PATHOLOGY:       ASSESSMENT & PLAN:  Persistent Anemia S/P Renal transplant CKD Aranesp growth factor support q 2 weeks Chronic anticoagulation secondary to mechanical St. Jude AVR Chronic Immunosuppression BMBX WNL Hypogonadism Dialysis  Patient stated he wished to change to getting his procrit here in  our clinic rather than at nephrology. I have advised him that his goal hemoglobin  is between 10-11 g/dL.   He is concerned about his aches/pains all over and wished to have lyme disease testing performed and I have ordered it today.   Order labs for today and next visit; CBC, CMP, testosterone, EPO level.   Continue Procrit and testosterone injections.   He will return for follow up in 2 months with labs.   No orders of the defined types were placed in this encounter.  All questions were answered. The patient knows to call the clinic with any problems, questions or concerns.  This document serves as a record of services personally performed by Twana First, MD. It was created on her behalf by Martinique Casey, a trained medical scribe. The creation of this record is based on the scribe's personal observations and the provider's statements to them. This document has been checked and approved by the attending provider.  I have reviewed the above documentation for accuracy and completeness and I agree with the above.  This note was electronically signed.  Martinique M Casey  06/09/2016 11:19 AM

## 2016-06-09 NOTE — Patient Instructions (Signed)
[place] at [facility] Discharge Instructions  RECOMMENDATIONS MADE BY THE CONSULTANT AND ANY TEST RESULTS WILL BE SENT TO YOUR REFERRING PHYSICIAN.  Received Testosterone injection today. Follow-up as scheduled. Call clinic for any questions or concerns  Thank you for choosing [place] at [facility] to provide your oncology and hematology care.  To afford each patient quality time with our provider, please arrive at least 15 minutes before your scheduled appointment time.    If you have a lab appointment with the [place] please come in thru the  Main Entrance and check in at the main information desk  You need to re-schedule your appointment should you arrive 10 or more minutes late.  We strive to give you quality time with our providers, and arriving late affects you and other patients whose appointments are after yours.  Also, if you no show three or more times for appointments you may be dismissed from the clinic at the providers discretion.     Again, thank you for choosing [facility].  Our hope is that these requests will decrease the amount of time that you wait before being seen by our physicians.       _____________________________________________________________  Should you have questions after your visit to [facility], please contact our office at [phone] between the hours of 8:30 a.m. and 4:30 p.m.  Voicemails left after 4:30 p.m. will not be returned until the following business day.  For prescription refill requests, have your pharmacy contact our office.       Resources For Cancer Patients and their Caregivers ? American Cancer Society: Can assist with transportation, wigs, general needs, runs Look Good Feel Better.        701-383-0924 ? Cancer Care: Provides financial assistance, online support groups, medication/co-pay assistance.  1-800-813-HOPE 785-124-3884) ? [place] Assists [place] cancer patients and their families through emotional , educational and financial support.  949-347-5135 ? [place] DSS Where to apply for food stamps, Medicaid and utility assistance. 830-419-0139 ? RCATS: Transportation to medical appointments. (430) 019-8806 ? Social Security Administration: May apply for disability if have a Stage IV cancer. 367-217-5987 360-474-8814 ? [employer], Disability and Transit Services: Assists with nutrition, care and transit needs. [place] Support Programs: @10RELATIVEDAYS @ > Cancer Support Group  2nd Tuesday of the month 1pm-2pm, Journey Room  > Creative Journey  3rd Tuesday of the month 1130am-1pm, Journey Room  > Look Good Feel Better  1st Wednesday of the month 10am-12 noon, Journey Room (Call [place] to register [phone])

## 2016-06-09 NOTE — Patient Instructions (Addendum)
[place] at [facility] Discharge Instructions  RECOMMENDATIONS MADE BY THE CONSULTANT AND ANY TEST RESULTS WILL BE SENT TO YOUR REFERRING PHYSICIAN.  You were seen today by Dr. Twana First  See Amy up front for appointments  Labs today (CBC diff, CMP, testosterone, erythropoietin, lyme disease)   Return to see Tom in 2 months with labs  Testosterone injection today and monthly   Thank you for choosing [place] at [facility] to provide your oncology and hematology care.  To afford each patient quality time with our provider, please arrive at least 15 minutes before your scheduled appointment time.    If you have a lab appointment with the [place] please come in thru the  Main Entrance and check in at the main information desk  You need to re-schedule your appointment should you arrive 10 or more minutes late.  We strive to give you quality time with our providers, and arriving late affects you and other patients whose appointments are after yours.  Also, if you no show three or more times for appointments you may be dismissed from the clinic at the providers discretion.     Again, thank you for choosing [facility].  Our hope is that these requests will decrease the amount of time that you wait before being seen by our physicians.       _____________________________________________________________  Should you have questions after your visit to [facility], please contact our office at [phone] between the hours of 8:30 a.m. and 4:30 p.m.  Voicemails left after 4:30 p.m. will not be returned until the following business day.  For prescription refill requests, have your pharmacy contact our office.       Resources For Cancer Patients and their Caregivers ? American Cancer Society: Can assist with transportation, wigs, general needs, runs Look Good Feel Better.        407-508-5105 ? Cancer  Care: Provides financial assistance, online support groups, medication/co-pay assistance.  1-800-813-HOPE 819-435-3422) ? [place] Assists [place] cancer patients and their families through emotional , educational and financial support.  3026136535 ? [place] DSS Where to apply for food stamps, Medicaid and utility assistance. 604-550-5714 ? RCATS: Transportation to medical appointments. (361)694-1137 ? Social Security Administration: May apply for disability if have a Stage IV cancer. 249-761-4918 434-530-4945 ? [employer], Disability and Transit Services: Assists with nutrition, care and transit needs. [place] Support Programs: @10RELATIVEDAYS @ > Cancer Support Group  2nd Tuesday of the month 1pm-2pm, Journey Room  > Creative Journey  3rd Tuesday of the month 1130am-1pm, Journey Room  > Look Good Feel Better  1st Wednesday of the month 10am-12 noon, Journey Room (Call [place] to register [phone])

## 2016-06-09 NOTE — Progress Notes (Signed)
Jeremy [name] tolerated Testosterone injection well without complaints or incident. Pt discharged self ambulatory in satisfactory condition

## 2016-06-10 ENCOUNTER — Other Ambulatory Visit (HOSPITAL_COMMUNITY): Payer: Self-pay | Admitting: Oncology

## 2016-06-10 DIAGNOSIS — D631 Anemia in chronic kidney disease: Secondary | ICD-10-CM | POA: Diagnosis not present

## 2016-06-10 DIAGNOSIS — N186 End stage renal disease: Secondary | ICD-10-CM | POA: Diagnosis not present

## 2016-06-10 DIAGNOSIS — N2581 Secondary hyperparathyroidism of renal origin: Secondary | ICD-10-CM | POA: Diagnosis not present

## 2016-06-10 LAB — ERYTHROPOIETIN: ERYTHROPOIETIN: 19.3 m[IU]/mL — AB (ref 2.6–18.5)

## 2016-06-10 LAB — TESTOSTERONE: Testosterone: 373 ng/dL (ref 264–916)

## 2016-06-10 NOTE — Progress Notes (Signed)
Also let him know i'm starting him here on monthly procrit for his anemia

## 2016-06-11 ENCOUNTER — Ambulatory Visit (HOSPITAL_COMMUNITY): Payer: Medicare Other

## 2016-06-11 DIAGNOSIS — Z954 Presence of other heart-valve replacement: Secondary | ICD-10-CM | POA: Diagnosis not present

## 2016-06-11 DIAGNOSIS — Z7901 Long term (current) use of anticoagulants: Secondary | ICD-10-CM | POA: Diagnosis not present

## 2016-06-11 LAB — POCT INR: INR: 3.1

## 2016-06-12 ENCOUNTER — Ambulatory Visit (HOSPITAL_COMMUNITY): Payer: Medicare Other

## 2016-06-12 ENCOUNTER — Ambulatory Visit (INDEPENDENT_AMBULATORY_CARE_PROVIDER_SITE_OTHER): Payer: Self-pay | Admitting: Pharmacist

## 2016-06-12 ENCOUNTER — Other Ambulatory Visit (HOSPITAL_COMMUNITY): Payer: Medicare Other

## 2016-06-12 ENCOUNTER — Other Ambulatory Visit (HOSPITAL_COMMUNITY): Payer: Self-pay | Admitting: Oncology

## 2016-06-12 DIAGNOSIS — Z952 Presence of prosthetic heart valve: Secondary | ICD-10-CM

## 2016-06-12 DIAGNOSIS — N186 End stage renal disease: Secondary | ICD-10-CM | POA: Diagnosis not present

## 2016-06-12 DIAGNOSIS — N2581 Secondary hyperparathyroidism of renal origin: Secondary | ICD-10-CM | POA: Diagnosis not present

## 2016-06-12 DIAGNOSIS — Z7901 Long term (current) use of anticoagulants: Secondary | ICD-10-CM

## 2016-06-12 DIAGNOSIS — D631 Anemia in chronic kidney disease: Secondary | ICD-10-CM | POA: Diagnosis not present

## 2016-06-12 DIAGNOSIS — Z5181 Encounter for therapeutic drug level monitoring: Secondary | ICD-10-CM

## 2016-06-12 NOTE — Progress Notes (Signed)
No we will do it once a month because he's getting a higher dose.

## 2016-06-12 NOTE — Progress Notes (Signed)
Procrit canceled

## 2016-06-13 LAB — B. BURGDORFI ANTIBODIES

## 2016-06-14 DIAGNOSIS — N186 End stage renal disease: Secondary | ICD-10-CM | POA: Diagnosis not present

## 2016-06-14 DIAGNOSIS — D631 Anemia in chronic kidney disease: Secondary | ICD-10-CM | POA: Diagnosis not present

## 2016-06-14 DIAGNOSIS — N2581 Secondary hyperparathyroidism of renal origin: Secondary | ICD-10-CM | POA: Diagnosis not present

## 2016-06-17 DIAGNOSIS — N2581 Secondary hyperparathyroidism of renal origin: Secondary | ICD-10-CM | POA: Diagnosis not present

## 2016-06-17 DIAGNOSIS — N186 End stage renal disease: Secondary | ICD-10-CM | POA: Diagnosis not present

## 2016-06-17 DIAGNOSIS — D631 Anemia in chronic kidney disease: Secondary | ICD-10-CM | POA: Diagnosis not present

## 2016-06-18 DIAGNOSIS — Z952 Presence of prosthetic heart valve: Secondary | ICD-10-CM | POA: Diagnosis not present

## 2016-06-18 DIAGNOSIS — Z9889 Other specified postprocedural states: Secondary | ICD-10-CM | POA: Diagnosis not present

## 2016-06-18 DIAGNOSIS — Z7901 Long term (current) use of anticoagulants: Secondary | ICD-10-CM | POA: Diagnosis not present

## 2016-06-18 DIAGNOSIS — Z48812 Encounter for surgical aftercare following surgery on the circulatory system: Secondary | ICD-10-CM | POA: Diagnosis not present

## 2016-06-18 DIAGNOSIS — I2510 Atherosclerotic heart disease of native coronary artery without angina pectoris: Secondary | ICD-10-CM | POA: Diagnosis not present

## 2016-06-18 DIAGNOSIS — I4510 Unspecified right bundle-branch block: Secondary | ICD-10-CM | POA: Diagnosis not present

## 2016-06-18 DIAGNOSIS — Z992 Dependence on renal dialysis: Secondary | ICD-10-CM | POA: Diagnosis not present

## 2016-06-18 DIAGNOSIS — I440 Atrioventricular block, first degree: Secondary | ICD-10-CM | POA: Diagnosis not present

## 2016-06-18 DIAGNOSIS — N186 End stage renal disease: Secondary | ICD-10-CM | POA: Diagnosis not present

## 2016-06-18 DIAGNOSIS — I350 Nonrheumatic aortic (valve) stenosis: Secondary | ICD-10-CM | POA: Diagnosis not present

## 2016-06-18 DIAGNOSIS — Z940 Kidney transplant status: Secondary | ICD-10-CM | POA: Diagnosis not present

## 2016-06-19 DIAGNOSIS — D631 Anemia in chronic kidney disease: Secondary | ICD-10-CM | POA: Diagnosis not present

## 2016-06-19 DIAGNOSIS — N2581 Secondary hyperparathyroidism of renal origin: Secondary | ICD-10-CM | POA: Diagnosis not present

## 2016-06-19 DIAGNOSIS — N186 End stage renal disease: Secondary | ICD-10-CM | POA: Diagnosis not present

## 2016-06-19 LAB — POCT INR: INR: 3.4

## 2016-06-20 ENCOUNTER — Ambulatory Visit (INDEPENDENT_AMBULATORY_CARE_PROVIDER_SITE_OTHER): Payer: Self-pay | Admitting: Pharmacist

## 2016-06-20 DIAGNOSIS — Z5181 Encounter for therapeutic drug level monitoring: Secondary | ICD-10-CM

## 2016-06-20 DIAGNOSIS — Z7901 Long term (current) use of anticoagulants: Secondary | ICD-10-CM

## 2016-06-20 DIAGNOSIS — Z952 Presence of prosthetic heart valve: Secondary | ICD-10-CM

## 2016-06-21 DIAGNOSIS — N2581 Secondary hyperparathyroidism of renal origin: Secondary | ICD-10-CM | POA: Diagnosis not present

## 2016-06-21 DIAGNOSIS — N186 End stage renal disease: Secondary | ICD-10-CM | POA: Diagnosis not present

## 2016-06-21 DIAGNOSIS — D631 Anemia in chronic kidney disease: Secondary | ICD-10-CM | POA: Diagnosis not present

## 2016-06-24 DIAGNOSIS — N186 End stage renal disease: Secondary | ICD-10-CM | POA: Diagnosis not present

## 2016-06-24 DIAGNOSIS — N2581 Secondary hyperparathyroidism of renal origin: Secondary | ICD-10-CM | POA: Diagnosis not present

## 2016-06-24 DIAGNOSIS — D631 Anemia in chronic kidney disease: Secondary | ICD-10-CM | POA: Diagnosis not present

## 2016-06-26 DIAGNOSIS — D631 Anemia in chronic kidney disease: Secondary | ICD-10-CM | POA: Diagnosis not present

## 2016-06-26 DIAGNOSIS — N186 End stage renal disease: Secondary | ICD-10-CM | POA: Diagnosis not present

## 2016-06-26 DIAGNOSIS — N2581 Secondary hyperparathyroidism of renal origin: Secondary | ICD-10-CM | POA: Diagnosis not present

## 2016-06-26 LAB — POCT INR: INR: 2

## 2016-06-27 ENCOUNTER — Ambulatory Visit (INDEPENDENT_AMBULATORY_CARE_PROVIDER_SITE_OTHER): Payer: Self-pay | Admitting: Pharmacist

## 2016-06-27 DIAGNOSIS — Z7901 Long term (current) use of anticoagulants: Secondary | ICD-10-CM

## 2016-06-27 DIAGNOSIS — Z952 Presence of prosthetic heart valve: Secondary | ICD-10-CM

## 2016-06-27 DIAGNOSIS — Z5181 Encounter for therapeutic drug level monitoring: Secondary | ICD-10-CM

## 2016-06-28 DIAGNOSIS — N2581 Secondary hyperparathyroidism of renal origin: Secondary | ICD-10-CM | POA: Diagnosis not present

## 2016-06-28 DIAGNOSIS — N186 End stage renal disease: Secondary | ICD-10-CM | POA: Diagnosis not present

## 2016-06-28 DIAGNOSIS — D631 Anemia in chronic kidney disease: Secondary | ICD-10-CM | POA: Diagnosis not present

## 2016-07-01 DIAGNOSIS — Z992 Dependence on renal dialysis: Secondary | ICD-10-CM | POA: Insufficient documentation

## 2016-07-01 DIAGNOSIS — D631 Anemia in chronic kidney disease: Secondary | ICD-10-CM | POA: Diagnosis not present

## 2016-07-01 DIAGNOSIS — N2581 Secondary hyperparathyroidism of renal origin: Secondary | ICD-10-CM | POA: Diagnosis not present

## 2016-07-01 DIAGNOSIS — N186 End stage renal disease: Secondary | ICD-10-CM | POA: Diagnosis not present

## 2016-07-03 ENCOUNTER — Other Ambulatory Visit (HOSPITAL_COMMUNITY): Payer: Self-pay | Admitting: *Deleted

## 2016-07-03 DIAGNOSIS — N186 End stage renal disease: Secondary | ICD-10-CM | POA: Diagnosis not present

## 2016-07-03 DIAGNOSIS — N2581 Secondary hyperparathyroidism of renal origin: Secondary | ICD-10-CM | POA: Diagnosis not present

## 2016-07-03 DIAGNOSIS — D631 Anemia in chronic kidney disease: Secondary | ICD-10-CM | POA: Diagnosis not present

## 2016-07-04 LAB — POCT INR: INR: 2

## 2016-07-05 DIAGNOSIS — N2581 Secondary hyperparathyroidism of renal origin: Secondary | ICD-10-CM | POA: Diagnosis not present

## 2016-07-05 DIAGNOSIS — Z992 Dependence on renal dialysis: Secondary | ICD-10-CM | POA: Diagnosis not present

## 2016-07-05 DIAGNOSIS — D631 Anemia in chronic kidney disease: Secondary | ICD-10-CM | POA: Diagnosis not present

## 2016-07-05 DIAGNOSIS — T8610 Unspecified complication of kidney transplant: Secondary | ICD-10-CM | POA: Diagnosis not present

## 2016-07-05 DIAGNOSIS — N186 End stage renal disease: Secondary | ICD-10-CM | POA: Diagnosis not present

## 2016-07-07 ENCOUNTER — Ambulatory Visit (INDEPENDENT_AMBULATORY_CARE_PROVIDER_SITE_OTHER): Payer: Self-pay | Admitting: Pharmacist

## 2016-07-07 ENCOUNTER — Encounter (HOSPITAL_COMMUNITY): Payer: Medicare Other | Attending: Hematology & Oncology

## 2016-07-07 ENCOUNTER — Encounter (HOSPITAL_COMMUNITY): Payer: Medicare Other

## 2016-07-07 ENCOUNTER — Other Ambulatory Visit (HOSPITAL_COMMUNITY): Payer: Self-pay | Admitting: Oncology

## 2016-07-07 VITALS — BP 163/57 | Temp 100.4°F | Resp 20

## 2016-07-07 DIAGNOSIS — Z940 Kidney transplant status: Secondary | ICD-10-CM | POA: Diagnosis not present

## 2016-07-07 DIAGNOSIS — Z952 Presence of prosthetic heart valve: Secondary | ICD-10-CM | POA: Diagnosis not present

## 2016-07-07 DIAGNOSIS — Z9889 Other specified postprocedural states: Secondary | ICD-10-CM | POA: Diagnosis not present

## 2016-07-07 DIAGNOSIS — Z79899 Other long term (current) drug therapy: Secondary | ICD-10-CM | POA: Diagnosis not present

## 2016-07-07 DIAGNOSIS — Z7901 Long term (current) use of anticoagulants: Secondary | ICD-10-CM | POA: Diagnosis not present

## 2016-07-07 DIAGNOSIS — D649 Anemia, unspecified: Secondary | ICD-10-CM | POA: Diagnosis not present

## 2016-07-07 DIAGNOSIS — Z85528 Personal history of other malignant neoplasm of kidney: Secondary | ICD-10-CM | POA: Diagnosis not present

## 2016-07-07 DIAGNOSIS — Z992 Dependence on renal dialysis: Secondary | ICD-10-CM | POA: Diagnosis not present

## 2016-07-07 DIAGNOSIS — Z5181 Encounter for therapeutic drug level monitoring: Secondary | ICD-10-CM

## 2016-07-07 DIAGNOSIS — E291 Testicular hypofunction: Secondary | ICD-10-CM

## 2016-07-07 DIAGNOSIS — G4733 Obstructive sleep apnea (adult) (pediatric): Secondary | ICD-10-CM | POA: Insufficient documentation

## 2016-07-07 DIAGNOSIS — N183 Chronic kidney disease, stage 3 unspecified: Secondary | ICD-10-CM

## 2016-07-07 DIAGNOSIS — I129 Hypertensive chronic kidney disease with stage 1 through stage 4 chronic kidney disease, or unspecified chronic kidney disease: Secondary | ICD-10-CM | POA: Insufficient documentation

## 2016-07-07 DIAGNOSIS — C649 Malignant neoplasm of unspecified kidney, except renal pelvis: Secondary | ICD-10-CM

## 2016-07-07 DIAGNOSIS — M1990 Unspecified osteoarthritis, unspecified site: Secondary | ICD-10-CM | POA: Insufficient documentation

## 2016-07-07 DIAGNOSIS — N189 Chronic kidney disease, unspecified: Secondary | ICD-10-CM | POA: Insufficient documentation

## 2016-07-07 DIAGNOSIS — E785 Hyperlipidemia, unspecified: Secondary | ICD-10-CM | POA: Diagnosis not present

## 2016-07-07 DIAGNOSIS — D631 Anemia in chronic kidney disease: Secondary | ICD-10-CM

## 2016-07-07 DIAGNOSIS — N186 End stage renal disease: Secondary | ICD-10-CM

## 2016-07-07 LAB — COMPREHENSIVE METABOLIC PANEL
ALT: 16 U/L — ABNORMAL LOW (ref 17–63)
ANION GAP: 13 (ref 5–15)
AST: 25 U/L (ref 15–41)
Albumin: 3.2 g/dL — ABNORMAL LOW (ref 3.5–5.0)
Alkaline Phosphatase: 228 U/L — ABNORMAL HIGH (ref 38–126)
BUN: 49 mg/dL — ABNORMAL HIGH (ref 6–20)
CHLORIDE: 96 mmol/L — AB (ref 101–111)
CO2: 25 mmol/L (ref 22–32)
Calcium: 9.1 mg/dL (ref 8.9–10.3)
Creatinine, Ser: 9.83 mg/dL — ABNORMAL HIGH (ref 0.61–1.24)
GFR, EST AFRICAN AMERICAN: 6 mL/min — AB (ref >60)
GFR, EST NON AFRICAN AMERICAN: 5 mL/min — AB (ref >60)
Glucose, Bld: 175 mg/dL — ABNORMAL HIGH (ref 65–99)
POTASSIUM: 4.9 mmol/L (ref 3.5–5.1)
Sodium: 134 mmol/L — ABNORMAL LOW (ref 135–145)
Total Bilirubin: 0.8 mg/dL (ref 0.3–1.2)
Total Protein: 7.9 g/dL (ref 6.5–8.1)

## 2016-07-07 LAB — CBC WITH DIFFERENTIAL/PLATELET
Basophils Absolute: 0 10*3/uL (ref 0.0–0.1)
Basophils Relative: 0 %
EOS PCT: 25 %
Eosinophils Absolute: 1.9 10*3/uL — ABNORMAL HIGH (ref 0.0–0.7)
HCT: 28.9 % — ABNORMAL LOW (ref 39.0–52.0)
Hemoglobin: 9.3 g/dL — ABNORMAL LOW (ref 13.0–17.0)
LYMPHS ABS: 1.7 10*3/uL (ref 0.7–4.0)
Lymphocytes Relative: 22 %
MCH: 34.2 pg — AB (ref 26.0–34.0)
MCHC: 32.2 g/dL (ref 30.0–36.0)
MCV: 106.3 fL — AB (ref 78.0–100.0)
MONO ABS: 0.6 10*3/uL (ref 0.1–1.0)
Monocytes Relative: 8 %
Neutro Abs: 3.3 10*3/uL (ref 1.7–7.7)
Neutrophils Relative %: 45 %
PLATELETS: 149 10*3/uL — AB (ref 150–400)
RBC: 2.72 MIL/uL — AB (ref 4.22–5.81)
RDW: 15.4 % (ref 11.5–15.5)
WBC Morphology: INCREASED
WBC: 7.5 10*3/uL (ref 4.0–10.5)

## 2016-07-07 LAB — PSA: PSA: 2.18 ng/mL (ref 0.00–4.00)

## 2016-07-07 MED ORDER — HEPARIN SOD (PORK) LOCK FLUSH 100 UNIT/ML IV SOLN
INTRAVENOUS | Status: AC
  Filled 2016-07-07: qty 5

## 2016-07-07 MED ORDER — HEPARIN SOD (PORK) LOCK FLUSH 100 UNIT/ML IV SOLN
INTRAVENOUS | Status: AC
Start: 2016-07-07 — End: 2016-07-07
  Filled 2016-07-07: qty 5

## 2016-07-07 MED ORDER — TESTOSTERONE CYPIONATE 200 MG/ML IM SOLN
100.0000 mg | INTRAMUSCULAR | Status: DC
  Administered 2016-07-07: 100 mg via INTRAMUSCULAR
  Filled 2016-07-07: qty 1

## 2016-07-07 NOTE — Progress Notes (Signed)
Pt states that this week he has a slight dry cough, chills, night sweats (soaking shirt - has to change shirt), appetite is fair, staying exhausted, states he could sleep all day. He also states that a chronic issue for him is muscle aches in the arms and down the flanks. Symptoms reported to Dr. Talbert Cage - she said he could follow up with his PCP. I instructed pt to report symptoms to his PCP if they didn't go away in the next few days. He said okay.  Jeremy [name] presents today for injection per MD orders. Testosterone 100mg  administered IM in left Gluteal. Administration without incident. Patient tolerated well.

## 2016-07-07 NOTE — Progress Notes (Signed)
No charge for labs or visit - INR check through home monitoring system  

## 2016-07-08 DIAGNOSIS — Z992 Dependence on renal dialysis: Secondary | ICD-10-CM | POA: Diagnosis not present

## 2016-07-08 DIAGNOSIS — D631 Anemia in chronic kidney disease: Secondary | ICD-10-CM | POA: Diagnosis not present

## 2016-07-08 DIAGNOSIS — N2581 Secondary hyperparathyroidism of renal origin: Secondary | ICD-10-CM | POA: Diagnosis not present

## 2016-07-08 DIAGNOSIS — N186 End stage renal disease: Secondary | ICD-10-CM | POA: Diagnosis not present

## 2016-07-08 LAB — TESTOSTERONE, FREE: TESTOSTERONE FREE: 9 pg/mL (ref 7.2–24.0)

## 2016-07-08 LAB — TESTOSTERONE: TESTOSTERONE: 300 ng/dL (ref 264–916)

## 2016-07-09 DIAGNOSIS — R6889 Other general symptoms and signs: Secondary | ICD-10-CM | POA: Diagnosis not present

## 2016-07-09 DIAGNOSIS — R509 Fever, unspecified: Secondary | ICD-10-CM | POA: Diagnosis not present

## 2016-07-09 DIAGNOSIS — Z954 Presence of other heart-valve replacement: Secondary | ICD-10-CM | POA: Diagnosis not present

## 2016-07-09 DIAGNOSIS — Z7901 Long term (current) use of anticoagulants: Secondary | ICD-10-CM | POA: Diagnosis not present

## 2016-07-09 LAB — POCT INR: INR: 3.1

## 2016-07-10 ENCOUNTER — Ambulatory Visit (INDEPENDENT_AMBULATORY_CARE_PROVIDER_SITE_OTHER): Payer: 59 | Admitting: Pharmacist

## 2016-07-10 DIAGNOSIS — Z952 Presence of prosthetic heart valve: Secondary | ICD-10-CM

## 2016-07-10 DIAGNOSIS — D631 Anemia in chronic kidney disease: Secondary | ICD-10-CM | POA: Diagnosis not present

## 2016-07-10 DIAGNOSIS — N186 End stage renal disease: Secondary | ICD-10-CM | POA: Diagnosis not present

## 2016-07-10 DIAGNOSIS — Z992 Dependence on renal dialysis: Secondary | ICD-10-CM | POA: Diagnosis not present

## 2016-07-10 DIAGNOSIS — N2581 Secondary hyperparathyroidism of renal origin: Secondary | ICD-10-CM | POA: Diagnosis not present

## 2016-07-10 DIAGNOSIS — Z7901 Long term (current) use of anticoagulants: Secondary | ICD-10-CM

## 2016-07-10 DIAGNOSIS — Z5181 Encounter for therapeutic drug level monitoring: Secondary | ICD-10-CM

## 2016-07-10 NOTE — Progress Notes (Signed)
Patient ID: Jeremy [name], male   DOB: 1955/06/26, 61 y.o.   MRN: [id number]   Patient checked INR at home weekly. Results are faxed to our office by [facility].   Subjective:     Indication: prosthetic heart valve Bleeding signs/symptoms: None Thromboembolic signs/symptoms: None  Missed Coumadin doses: None Medication changes: no Dietary changes: no Bacterial/viral infection: no  Objective:    INR Today: 3.1 Current dose: warfarin 2mg  qd except 3mg  on Fridays.    Assessment:    Therapeutic INR for goal of 2-3   Plan:    1. New dose: hold warfarin for 1 day, then restart current dose   2. Next INR: 1 week    Patient checks INR at home with Home INR monitoring.  Billing once per month interupertation fee.  Patient diagnosis - chronic anticoagulation and history of prosthetic valve replacement  Procedure code if G0250

## 2016-07-12 DIAGNOSIS — N186 End stage renal disease: Secondary | ICD-10-CM | POA: Diagnosis not present

## 2016-07-12 DIAGNOSIS — Z992 Dependence on renal dialysis: Secondary | ICD-10-CM | POA: Diagnosis not present

## 2016-07-12 DIAGNOSIS — D631 Anemia in chronic kidney disease: Secondary | ICD-10-CM | POA: Diagnosis not present

## 2016-07-12 DIAGNOSIS — N2581 Secondary hyperparathyroidism of renal origin: Secondary | ICD-10-CM | POA: Diagnosis not present

## 2016-07-15 DIAGNOSIS — N2581 Secondary hyperparathyroidism of renal origin: Secondary | ICD-10-CM | POA: Diagnosis not present

## 2016-07-15 DIAGNOSIS — Z992 Dependence on renal dialysis: Secondary | ICD-10-CM | POA: Diagnosis not present

## 2016-07-15 DIAGNOSIS — D631 Anemia in chronic kidney disease: Secondary | ICD-10-CM | POA: Diagnosis not present

## 2016-07-15 DIAGNOSIS — N186 End stage renal disease: Secondary | ICD-10-CM | POA: Diagnosis not present

## 2016-07-16 LAB — POCT INR: INR: 2.7

## 2016-07-17 ENCOUNTER — Emergency Department: Payer: Medicare Other

## 2016-07-17 ENCOUNTER — Ambulatory Visit: Payer: Self-pay | Admitting: Pharmacist

## 2016-07-17 ENCOUNTER — Encounter: Payer: Self-pay | Admitting: Emergency Medicine

## 2016-07-17 ENCOUNTER — Inpatient Hospital Stay
Admission: EM | Admit: 2016-07-17 | Discharge: 2016-07-22 | DRG: 698 | Disposition: A | Discharge status: Home / Self Care | Payer: Medicare Other | Attending: Internal Medicine | Admitting: Internal Medicine

## 2016-07-17 ENCOUNTER — Other Ambulatory Visit: Payer: Self-pay

## 2016-07-17 DIAGNOSIS — Z955 Presence of coronary angioplasty implant and graft: Secondary | ICD-10-CM

## 2016-07-17 DIAGNOSIS — G4733 Obstructive sleep apnea (adult) (pediatric): Secondary | ICD-10-CM | POA: Diagnosis present

## 2016-07-17 DIAGNOSIS — Z7901 Long term (current) use of anticoagulants: Secondary | ICD-10-CM

## 2016-07-17 DIAGNOSIS — I359 Nonrheumatic aortic valve disorder, unspecified: Secondary | ICD-10-CM | POA: Diagnosis not present

## 2016-07-17 DIAGNOSIS — I2510 Atherosclerotic heart disease of native coronary artery without angina pectoris: Secondary | ICD-10-CM | POA: Diagnosis present

## 2016-07-17 DIAGNOSIS — T8612 Kidney transplant failure: Secondary | ICD-10-CM | POA: Diagnosis not present

## 2016-07-17 DIAGNOSIS — Z5181 Encounter for therapeutic drug level monitoring: Secondary | ICD-10-CM

## 2016-07-17 DIAGNOSIS — N028 Recurrent and persistent hematuria with other morphologic changes: Secondary | ICD-10-CM | POA: Diagnosis not present

## 2016-07-17 DIAGNOSIS — Z952 Presence of prosthetic heart valve: Secondary | ICD-10-CM

## 2016-07-17 DIAGNOSIS — K529 Noninfective gastroenteritis and colitis, unspecified: Secondary | ICD-10-CM | POA: Diagnosis present

## 2016-07-17 DIAGNOSIS — Z85528 Personal history of other malignant neoplasm of kidney: Secondary | ICD-10-CM

## 2016-07-17 DIAGNOSIS — Z888 Allergy status to other drugs, medicaments and biological substances status: Secondary | ICD-10-CM

## 2016-07-17 DIAGNOSIS — N2581 Secondary hyperparathyroidism of renal origin: Secondary | ICD-10-CM | POA: Diagnosis not present

## 2016-07-17 DIAGNOSIS — M109 Gout, unspecified: Secondary | ICD-10-CM | POA: Diagnosis present

## 2016-07-17 DIAGNOSIS — D649 Anemia, unspecified: Secondary | ICD-10-CM | POA: Diagnosis not present

## 2016-07-17 DIAGNOSIS — I4581 Long QT syndrome: Secondary | ICD-10-CM | POA: Diagnosis not present

## 2016-07-17 DIAGNOSIS — D631 Anemia in chronic kidney disease: Secondary | ICD-10-CM | POA: Diagnosis not present

## 2016-07-17 DIAGNOSIS — Z992 Dependence on renal dialysis: Secondary | ICD-10-CM

## 2016-07-17 DIAGNOSIS — R509 Fever, unspecified: Secondary | ICD-10-CM | POA: Diagnosis present

## 2016-07-17 DIAGNOSIS — Z809 Family history of malignant neoplasm, unspecified: Secondary | ICD-10-CM

## 2016-07-17 DIAGNOSIS — T8611 Kidney transplant rejection: Principal | ICD-10-CM | POA: Diagnosis present

## 2016-07-17 DIAGNOSIS — E785 Hyperlipidemia, unspecified: Secondary | ICD-10-CM | POA: Diagnosis present

## 2016-07-17 DIAGNOSIS — M1990 Unspecified osteoarthritis, unspecified site: Secondary | ICD-10-CM | POA: Diagnosis present

## 2016-07-17 DIAGNOSIS — Y830 Surgical operation with transplant of whole organ as the cause of abnormal reaction of the patient, or of later complication, without mention of misadventure at the time of the procedure: Secondary | ICD-10-CM | POA: Diagnosis present

## 2016-07-17 DIAGNOSIS — I132 Hypertensive heart and chronic kidney disease with heart failure and with stage 5 chronic kidney disease, or end stage renal disease: Secondary | ICD-10-CM | POA: Diagnosis present

## 2016-07-17 DIAGNOSIS — Z8249 Family history of ischemic heart disease and other diseases of the circulatory system: Secondary | ICD-10-CM

## 2016-07-17 DIAGNOSIS — N186 End stage renal disease: Secondary | ICD-10-CM | POA: Diagnosis not present

## 2016-07-17 DIAGNOSIS — Z833 Family history of diabetes mellitus: Secondary | ICD-10-CM

## 2016-07-17 LAB — CBC WITH DIFFERENTIAL/PLATELET
Basophils Absolute: 0 10*3/uL (ref 0–0.1)
Basophils Relative: 1 %
Eosinophils Absolute: 0.9 10*3/uL — ABNORMAL HIGH (ref 0–0.7)
Eosinophils Relative: 12 %
HEMATOCRIT: 29 % — AB (ref 40.0–52.0)
Hemoglobin: 9.7 g/dL — ABNORMAL LOW (ref 13.0–18.0)
LYMPHS ABS: 1.8 10*3/uL (ref 1.0–3.6)
LYMPHS PCT: 25 %
MCH: 34 pg (ref 26.0–34.0)
MCHC: 33.3 g/dL (ref 32.0–36.0)
MCV: 102.1 fL — ABNORMAL HIGH (ref 80.0–100.0)
MONO ABS: 0.7 10*3/uL (ref 0.2–1.0)
MONOS PCT: 10 %
NEUTROS ABS: 3.7 10*3/uL (ref 1.4–6.5)
Neutrophils Relative %: 52 %
Platelets: 185 10*3/uL (ref 150–440)
RBC: 2.84 MIL/uL — ABNORMAL LOW (ref 4.40–5.90)
RDW: 16.5 % — AB (ref 11.5–14.5)
WBC: 7.1 10*3/uL (ref 3.8–10.6)

## 2016-07-17 LAB — COMPREHENSIVE METABOLIC PANEL
ALBUMIN: 3 g/dL — AB (ref 3.5–5.0)
ALT: 13 U/L — ABNORMAL LOW (ref 17–63)
AST: 25 U/L (ref 15–41)
Alkaline Phosphatase: 191 U/L — ABNORMAL HIGH (ref 38–126)
Anion gap: 11 (ref 5–15)
BUN: 21 mg/dL — ABNORMAL HIGH (ref 6–20)
CHLORIDE: 96 mmol/L — AB (ref 101–111)
CO2: 29 mmol/L (ref 22–32)
Calcium: 8.9 mg/dL (ref 8.9–10.3)
Creatinine, Ser: 5.37 mg/dL — ABNORMAL HIGH (ref 0.61–1.24)
GFR calc Af Amer: 12 mL/min — ABNORMAL LOW (ref >60)
GFR calc non Af Amer: 11 mL/min — ABNORMAL LOW (ref >60)
GLUCOSE: 127 mg/dL — AB (ref 65–99)
POTASSIUM: 4 mmol/L (ref 3.5–5.1)
Sodium: 136 mmol/L (ref 135–145)
Total Bilirubin: 0.8 mg/dL (ref 0.3–1.2)
Total Protein: 7.9 g/dL (ref 6.5–8.1)

## 2016-07-17 LAB — INFLUENZA PANEL BY PCR (TYPE A & B)
Influenza A By PCR: NEGATIVE
Influenza B By PCR: NEGATIVE

## 2016-07-17 LAB — PROTIME-INR
INR: 2.45
Prothrombin Time: 27 seconds — ABNORMAL HIGH (ref 11.4–15.2)

## 2016-07-17 LAB — LACTIC ACID, PLASMA: LACTIC ACID, VENOUS: 1.5 mmol/L (ref 0.5–1.9)

## 2016-07-17 LAB — PROCALCITONIN: Procalcitonin: 1.61 ng/mL

## 2016-07-17 LAB — MRSA PCR SCREENING: MRSA by PCR: NEGATIVE

## 2016-07-17 LAB — TROPONIN I: Troponin I: 0.06 ng/mL (ref <0.03)

## 2016-07-17 LAB — CK: Total CK: 51 U/L (ref 49–397)

## 2016-07-17 MED ORDER — WARFARIN - PHYSICIAN DOSING INPATIENT
Freq: Every day | Status: DC
  Administered 2016-07-18: 18:00:00

## 2016-07-17 MED ORDER — SODIUM CHLORIDE 0.9% FLUSH: 3.0000 mL | INTRAVENOUS | Status: DC | PRN

## 2016-07-17 MED ORDER — VANCOMYCIN HCL IN DEXTROSE 1-5 GM/200ML-% IV SOLN
1000.0000 mg | Freq: Once | INTRAVENOUS | Status: AC
Start: 2016-07-17 — End: 2016-07-17
  Administered 2016-07-17: 1000 mg via INTRAVENOUS
  Filled 2016-07-17: qty 200

## 2016-07-17 MED ORDER — AMLODIPINE BESYLATE 5 MG PO TABS
5.0000 mg | ORAL_TABLET | Freq: Every evening | ORAL | Status: DC
  Administered 2016-07-17 – 2016-07-21 (×4): 5 mg via ORAL
  Filled 2016-07-17 (×5): qty 1

## 2016-07-17 MED ORDER — PANTOPRAZOLE SODIUM 40 MG PO TBEC
40.0000 mg | DELAYED_RELEASE_TABLET | Freq: Every day | ORAL | Status: DC
  Administered 2016-07-18 – 2016-07-22 (×5): 40 mg via ORAL
  Filled 2016-07-17 (×5): qty 1

## 2016-07-17 MED ORDER — SEVELAMER CARBONATE 0.8 G PO PACK
0.8000 g | PACK | Freq: Three times a day (TID) | ORAL | Status: DC
  Administered 2016-07-17 – 2016-07-21 (×12): 0.8 g via ORAL
  Filled 2016-07-17 (×14): qty 1

## 2016-07-17 MED ORDER — WARFARIN SODIUM 2 MG PO TABS: 3.0000 mg | ORAL_TABLET | ORAL | Status: DC

## 2016-07-17 MED ORDER — HYDRALAZINE HCL 20 MG/ML IJ SOLN
5.0000 mg | INTRAMUSCULAR | Status: AC
  Administered 2016-07-17: 5 mg via INTRAVENOUS
  Filled 2016-07-17: qty 1

## 2016-07-17 MED ORDER — ALLOPURINOL 100 MG PO TABS
200.0000 mg | ORAL_TABLET | Freq: Every day | ORAL | Status: DC
  Administered 2016-07-18 – 2016-07-22 (×5): 200 mg via ORAL
  Filled 2016-07-17 (×5): qty 2

## 2016-07-17 MED ORDER — NITROGLYCERIN 0.4 MG SL SUBL: 0.4000 mg | SUBLINGUAL_TABLET | SUBLINGUAL | Status: DC | PRN

## 2016-07-17 MED ORDER — SODIUM CHLORIDE 0.9 % IV SOLN: 250.0000 mL | INTRAVENOUS | Status: DC | PRN

## 2016-07-17 MED ORDER — ACETAMINOPHEN 500 MG PO TABS
1000.0000 mg | ORAL_TABLET | Freq: Once | ORAL | Status: AC
  Administered 2016-07-17: 1000 mg via ORAL
  Filled 2016-07-17: qty 2

## 2016-07-17 MED ORDER — HYDRALAZINE HCL 20 MG/ML IJ SOLN: 5.0000 mg | Freq: Three times a day (TID) | INTRAMUSCULAR | Status: DC | PRN

## 2016-07-17 MED ORDER — WARFARIN SODIUM 2 MG PO TABS: 3.0000 mg | ORAL_TABLET | Freq: Every day | ORAL | Status: DC

## 2016-07-17 MED ORDER — MAGNESIUM OXIDE 400 (241.3 MG) MG PO TABS
400.0000 mg | ORAL_TABLET | Freq: Two times a day (BID) | ORAL | Status: DC
  Administered 2016-07-17 – 2016-07-22 (×10): 400 mg via ORAL
  Filled 2016-07-17 (×10): qty 1

## 2016-07-17 MED ORDER — PIPERACILLIN-TAZOBACTAM 3.375 G IVPB 30 MIN
3.3750 g | Freq: Once | INTRAVENOUS | Status: AC
  Administered 2016-07-17: 3.375 g via INTRAVENOUS
  Filled 2016-07-17: qty 50

## 2016-07-17 MED ORDER — ACETAMINOPHEN 325 MG PO TABS
650.0000 mg | ORAL_TABLET | Freq: Four times a day (QID) | ORAL | Status: DC | PRN
  Administered 2016-07-17 – 2016-07-21 (×8): 650 mg via ORAL
  Filled 2016-07-17 (×8): qty 2

## 2016-07-17 MED ORDER — PRAVASTATIN SODIUM 40 MG PO TABS
40.0000 mg | ORAL_TABLET | Freq: Every day | ORAL | Status: DC
  Administered 2016-07-21: 40 mg via ORAL
  Filled 2016-07-17 (×5): qty 1

## 2016-07-17 MED ORDER — WARFARIN SODIUM 2 MG PO TABS: 2.0000 mg | ORAL_TABLET | Freq: Every day | ORAL | Status: DC

## 2016-07-17 MED ORDER — CARVEDILOL 12.5 MG PO TABS
12.5000 mg | ORAL_TABLET | Freq: Two times a day (BID) | ORAL | Status: DC
  Administered 2016-07-17 – 2016-07-22 (×9): 12.5 mg via ORAL
  Filled 2016-07-17 (×10): qty 1

## 2016-07-17 MED ORDER — WARFARIN SODIUM 2 MG PO TABS
2.0000 mg | ORAL_TABLET | ORAL | Status: DC
  Administered 2016-07-17 – 2016-07-18 (×2): 2 mg via ORAL
  Filled 2016-07-17 (×2): qty 1

## 2016-07-17 MED ORDER — DOXYCYCLINE HYCLATE 100 MG IV SOLR
100.0000 mg | Freq: Once | INTRAVENOUS | Status: AC
  Administered 2016-07-17: 100 mg via INTRAVENOUS
  Filled 2016-07-17: qty 100

## 2016-07-17 MED ORDER — SODIUM CHLORIDE 0.9% FLUSH
3.0000 mL | Freq: Two times a day (BID) | INTRAVENOUS | Status: DC
  Administered 2016-07-17 – 2016-07-21 (×9): 3 mL via INTRAVENOUS

## 2016-07-17 MED ORDER — DICYCLOMINE HCL 10 MG PO CAPS
20.0000 mg | ORAL_CAPSULE | Freq: Two times a day (BID) | ORAL | Status: DC
  Administered 2016-07-17 – 2016-07-22 (×10): 20 mg via ORAL
  Filled 2016-07-17 (×10): qty 2

## 2016-07-17 NOTE — H&P (Signed)
Jeremy [name] is an 61 y.o. male.   Chief Complaint: Fever HPI: This is a 61 year old dialysis patient who has an aortic valve replacement who presents to the ER after 2+ weeks of fever. His fevers have 101.3 at home. He complains of night sweats. Has had some joint aches but nothing specific. He receives dialysis through an AV graft in the left forearm. States had no soreness or swelling or erythema around the site.  Past Medical History:  Diagnosis Date  . Anemia   . Arthritis   . Blood transfusion without reported diagnosis   . Cancer ([place])   . Chronic anticoagulation 06/2012  . Dialysis patient [facility]) 01 01 2008  . Hx of colonoscopy    2009 by Dr. Laural Golden. Normal except for small submucosal lipoma. No evidence of polyps or colitis.  . Hyperlipidemia   . Hypertension   . 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  Marland Kitchen OSA (obstructive sleep apnea)     Past Surgical History:  Procedure Laterality Date  . AORTIC VALVE REPLACEMENT  3 /11 /2014   At [facility]    . CORONARY ANGIOPLASTY WITH STENT PLACEMENT    . DG AV DIALYSIS  SHUNT ACCESS EXIST*L* OR     left arm  . KIDNEY TRANSPLANT      Family History  Problem Relation Age of Onset  . Cancer Mother     pancreatic  . Heart disease Father   . Diabetes Father    Social History:  reports that he has never smoked. He has never used smokeless tobacco. He reports that he does not drink alcohol or use drugs.  Allergies:  Allergies  Allergen Reactions  . Atorvastatin Other (See Comments)    Muscle Aches  . Sertraline Rash     (Not in a hospital admission)  Results for orders placed or performed during the hospital encounter of 07/17/16 (from the past 48 hour(s))  Lactic acid, plasma     Status: None   Collection  Time: 07/17/16  3:27 PM  Result Value Ref Range   Lactic Acid, Venous 1.5 0.5 - 1.9 mmol/L  Comprehensive metabolic panel     Status: Abnormal   Collection Time: 07/17/16  3:27 PM  Result Value Ref Range   Sodium 136 135 - 145 mmol/L   Potassium 4.0 3.5 - 5.1 mmol/L   Chloride 96 (L) 101 - 111 mmol/L   CO2 29 22 - 32 mmol/L   Glucose, Bld 127 (H) 65 - 99 mg/dL   BUN 21 (H) 6 - 20 mg/dL   Creatinine, Ser 5.37 (H) 0.61 - 1.24 mg/dL   Calcium 8.9 8.9 - 10.3 mg/dL   Total Protein 7.9 6.5 - 8.1 g/dL   Albumin 3.0 (L) 3.5 - 5.0 g/dL   AST 25 15 - 41 U/L   ALT 13 (L) 17 - 63 U/L   Alkaline Phosphatase 191 (H) 38 - 126 U/L   Total Bilirubin 0.8 0.3 - 1.2 mg/dL   GFR calc non Af Amer 11 (L) >60 mL/min   GFR calc Af Amer 12 (L) >60 mL/min    Comment: (NOTE) The eGFR has been calculated using the CKD EPI equation. This calculation has not been validated in all clinical situations. eGFR's persistently <60 mL/min signify possible Chronic  Kidney Disease.    Anion gap 11 5 - 15  Troponin I     Status: Abnormal   Collection Time: 07/17/16  3:27 PM  Result Value Ref Range   Troponin I 0.06 (HH) <0.03 ng/mL    Comment: CRITICAL RESULT CALLED TO, READ BACK BY AND VERIFIED WITH ASHLEY SMITH 07/17/16 @ [facility]   CBC WITH DIFFERENTIAL     Status: Abnormal   Collection Time: 07/17/16  3:27 PM  Result Value Ref Range   WBC 7.1 3.8 - 10.6 K/uL   RBC 2.84 (L) 4.40 - 5.90 MIL/uL   Hemoglobin 9.7 (L) 13.0 - 18.0 g/dL   HCT 29.0 (L) 40.0 - 52.0 %   MCV 102.1 (H) 80.0 - 100.0 fL   MCH 34.0 26.0 - 34.0 pg   MCHC 33.3 32.0 - 36.0 g/dL   RDW 16.5 (H) 11.5 - 14.5 %   Platelets 185 150 - 440 K/uL   Neutrophils Relative % 52 %   Neutro Abs 3.7 1.4 - 6.5 K/uL   Lymphocytes Relative 25 %   Lymphs Abs 1.8 1.0 - 3.6 K/uL   Monocytes Relative 10 %   Monocytes Absolute 0.7 0.2 - 1.0 K/uL   Eosinophils Relative 12 %   Eosinophils Absolute 0.9 (H) 0 - 0.7 K/uL   Basophils Relative 1 %   Basophils  Absolute 0.0 0 - 0.1 K/uL  Procalcitonin     Status: None   Collection Time: 07/17/16  3:27 PM  Result Value Ref Range   Procalcitonin 1.61 ng/mL    Comment:        Interpretation: PCT > 0.5 ng/mL and <= 2 ng/mL: Systemic infection (sepsis) is possible, but other conditions are known to elevate PCT as well. (NOTE)         ICU PCT Algorithm               Non ICU PCT Algorithm    ----------------------------     ------------------------------         PCT < 0.25 ng/mL                 PCT < 0.1 ng/mL     Stopping of antibiotics            Stopping of antibiotics       strongly encouraged.               strongly encouraged.    ----------------------------     ------------------------------       PCT level decrease by               PCT < 0.25 ng/mL       >= 80% from peak PCT       OR PCT 0.25 - 0.5 ng/mL          Stopping of antibiotics                                             encouraged.     Stopping of antibiotics           encouraged.    ----------------------------     ------------------------------       PCT level decrease by              PCT >= 0.25 ng/mL       < 80% from peak PCT  AND PCT >= 0.5 ng/mL             Continuing antibiotics                                              encouraged.       Continuing antibiotics            encouraged.    ----------------------------     ------------------------------     PCT level increase compared          PCT > 0.5 ng/mL         with peak PCT AND          PCT >= 0.5 ng/mL             Escalation of antibiotics                                          strongly encouraged.      Escalation of antibiotics        strongly encouraged.   Influenza panel by PCR (type A & B)     Status: None   Collection Time: 07/17/16  3:27 PM  Result Value Ref Range   Influenza A By PCR NEGATIVE NEGATIVE   Influenza B By PCR NEGATIVE NEGATIVE    Comment: (NOTE) The Xpert Xpress Flu assay is intended as an aid in the diagnosis of  influenza  and should not be used as a sole basis for treatment.  This  assay is FDA approved for nasopharyngeal swab specimens only. Nasal  washings and aspirates are unacceptable for Xpert Xpress Flu testing.   CK     Status: None   Collection Time: 07/17/16  3:27 PM  Result Value Ref Range   Total CK 51 49 - 397 U/L  Protime-INR     Status: Abnormal   Collection Time: 07/17/16  4:55 PM  Result Value Ref Range   Prothrombin Time 27.0 (H) 11.4 - 15.2 seconds   INR 2.45    Dg Chest 2 View  Result Date: 07/17/2016 CLINICAL DATA:  Fever. EXAM: CHEST  2 VIEW COMPARISON:  Radiographs of January 06, 2008. FINDINGS: Stable cardiomediastinal silhouette. Status post aortic valve repair. Stable mild central pulmonary vascular congestion is noted. No pneumothorax or pleural effusion is noted. No acute pulmonary disease is noted. Old right rib fracture is noted. IMPRESSION: Stable central pulmonary vascular congestion. No other significant abnormality seen. Electronically Signed   By: Marijo Conception, M.D.   On: 07/17/2016 16:25    Review of Systems  Constitutional: Positive for fever.  HENT: Negative for hearing loss.   Eyes: Negative for blurred vision.  Respiratory: Positive for cough.   Gastrointestinal: Negative for nausea and vomiting.  Genitourinary: Negative for dysuria.  Musculoskeletal: Positive for myalgias.  Skin: Negative for rash.  Neurological: Negative for dizziness.    Blood pressure (!) 159/67, pulse 80, temperature 98.7 F (37.1 C), temperature source Oral, resp. rate 13, height '5\' 10"'  (1.778 m), weight 84.4 kg (186 lb), SpO2 99 %. Physical Exam  Constitutional: He is oriented to person, place, and time. He appears well-developed and well-nourished. No distress.  HENT:  Head: Normocephalic and atraumatic.  Mouth/Throat: Oropharynx is clear and moist. No oropharyngeal exudate.  Eyes:  Conjunctivae are normal. Pupils are equal, round, and reactive to light. No scleral icterus.  Neck:  Normal range of motion. Neck supple. No tracheal deviation present. No thyromegaly present.  Cardiovascular: Normal rate.   Positive click  Respiratory: Effort normal and breath sounds normal. No respiratory distress. He exhibits no tenderness.  GI: Soft. Bowel sounds are normal. He exhibits no mass.  No hepatosplenomegaly  Musculoskeletal: Normal range of motion. He exhibits no edema or tenderness.  Neurological: He is alert and oriented to person, place, and time. No cranial nerve deficit.     Assessment/Plan 1. Fever of unknown origin.  Patient has no obvious signs of infection. He does not make urine to evaluate. Chest x-ray does not show any evidence. Influenza test are negative. His dialysis shunt does not show any obvious sign of erythema or tenderness. At this point would want to rule him out for endocarditis especially since he has artificial heart valve. Blood cultures have been drawn. Discussed with cardiology will go ahead and order trends thoracic echocardiogram start with if this does not give [name] a good nephew of the valves and will proceed with TEE. At this point we'll hold off on antibiotics until have results. 2. Status post aortic valve replacement. Patient has mechanical valve and is on full anticoagulation. INR is therapeutic. 3. End-stage renal disease. We'll continue dialysis schedule as needed here in the hospital. Otherwise can continue as an outpatient. 4. Anemia. He just got Procrit suite. He will appears to be stable. Next line Total time spent 45 minutes  Baxter Hire, MD 07/17/2016, 5:39 PM

## 2016-07-17 NOTE — Progress Notes (Signed)
MD notified of BP and temp. Orders for iv hydralazine x 1 now. Will give and continue to monitor.

## 2016-07-17 NOTE — ED Provider Notes (Signed)
[facility] Emergency Department Provider Note  ____________________________________________   First MD Initiated Contact with Patient 07/17/16 1507     (approximate)  I have reviewed the triage vital signs and the nursing notes.   HISTORY  Chief Complaint Fever and Cough   HPI Jeremy [name] is a 61 y.o. male who is sent to the emergency department from [place] clinic for fever of unknown origin.The patient reports 2 weeks of fever to 101.6 at home. He's had several days of dry cough but other than that he reports myalgias and generalized fatigue. He has initially seen in clinic 8 days ago when he had negative blood cultures and a negative influenza test. He came back for recheck today he reported persistent fevers and so he was sent to the emergency department. He has a complex past medical history including IgA nephropathy leading to end-stage renal disease requiring hemodialysis. He no longer urinates. He was last dialyzed today. He also has an aortic valve replacement remotely. He does work outside quite a bit gardening around his home. He denies chest pain. He denies sore throat. He denies abdominal pain nausea or vomiting. He denies rash. He denies neck pain or stiffness.   Past Medical History:  Diagnosis Date  . Anemia   . Arthritis   . Blood transfusion without reported diagnosis   . Cancer ([place])   . Chronic anticoagulation 06/2012  . Dialysis patient [name]) 01 01 2008  . Hx of colonoscopy    2009 by Dr. Laural Golden. Normal except for small submucosal lipoma. No evidence of polyps or colitis.  . Hyperlipidemia   . Hypertension   . 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  Marland Kitchen OSA (obstructive sleep apnea)     Patient Active Problem List   Diagnosis Date Noted  . Fever 07/17/2016    . Hypogonadism in male 07/25/2015  . 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 Gout 04/24/2015  . History of prosthetic heart valve 12/07/2014  . Malignant neoplasm of kidney ([place]) 03/17/2013  . ETD (eustachian tube dysfunction) 03/17/2013  . OSA (obstructive sleep apnea)   . Ventral hernia 01/17/2013  . Disorder of magnesium metabolism 01/03/2013  . Encounter for long-term (current) use of other medications 12/15/2012  . History of kidney transplant 12/15/2012  . Postsurgical percutaneous transluminal coronary angioplasty status 09/07/2012  . Low back pain 09/06/2012  . Thoracic or lumbosacral neuritis or radiculitis 09/06/2012  . Sacroiliitis ([place]) 09/06/2012  . HLD (hyperlipidemia) 07/26/2012  . CKD (chronic kidney disease) stage V requiring chronic dialysis ([name]) 07/26/2012  . Hyperlipidemia 07/26/2012  . S/P AVR (aortic valve replacement) 06/23/2012  . Anticoagulation goal of INR 2 to 3 06/23/2012  . Aortic valve disorder 06/14/2012  . Congestive heart failure ([place]) 05/30/2012  . Nontoxic uninodular goiter 06/26/2011  . Coronary atherosclerosis 05/26/2011  . Angina pectoris ([place]) 05/19/2011  . Essential hypertension 05/19/2011  . Intermediate coronary syndrome ([place]) 05/19/2011    Past Surgical History:  Procedure Laterality Date  . AORTIC VALVE REPLACEMENT  3 /11 /2014   At [facility]    . CORONARY ANGIOPLASTY WITH STENT PLACEMENT    . DG AV DIALYSIS  SHUNT ACCESS EXIST*L* OR     left arm  . KIDNEY TRANSPLANT  Prior to Admission medications   Medication Sig Start Date End Date Taking? Authorizing Provider  acetaminophen (TYLENOL) 325 MG tablet Take 650 mg by mouth every 6 (six) hours as needed.   Yes Historical Provider, MD  allopurinol (ZYLOPRIM) 100 MG tablet Take 200 mg by mouth daily.    Yes Historical Provider, MD  amLODipine (NORVASC) 5 MG tablet Take 5 mg by mouth daily.   Yes Historical Provider, MD   carvedilol (COREG) 12.5 MG tablet Take 12.5 mg by mouth 2 (two) times daily. 06/24/16 06/24/17 Yes Historical Provider, MD  dicyclomine (BENTYL) 10 MG capsule Take 2 capsules (20 mg total) by mouth 3 (three) times daily before meals. Patient taking differently: Take 20 mg by mouth 2 (two) times daily.  11/14/15  Yes Butch Penny, NP  diphenoxylate-atropine (LOMOTIL) 2.5-0.025 MG tablet Take 1 tablet by mouth 4 (four) times daily as needed for diarrhea or loose stools. 09/28/15  Yes Sharion Balloon, FNP  lidocaine-prilocaine (EMLA) cream Apply topically. 01/23/16 01/22/17 Yes Historical Provider, MD  loratadine (CLARITIN) 10 MG tablet Take 10 mg by mouth daily as needed. Reported on 06/12/2015   Yes Historical Provider, MD  magnesium oxide (MAG-OX) 400 (241.3 MG) MG tablet Take 400 mg by mouth 2 (two) times daily. 11/01/14  Yes Historical Provider, MD  Omega-3 1000 MG CAPS Take 3 g by mouth.   Yes Historical Provider, MD  omeprazole (PRILOSEC) 20 MG capsule Take 20 mg by mouth daily.   Yes Historical Provider, MD  Sevelamer Carbonate (RENVELA PO) Take 1 tablet by mouth 3 (three) times daily after meals.   Yes Historical Provider, MD  warfarin (COUMADIN) 2 MG tablet Take 2 mg by mouth daily. 2mg  Everyday, except Sunday which is 3mg    Yes Historical Provider, MD  warfarin (COUMADIN) 3 MG tablet Take 3 mg by mouth daily. Only on SUNDAYS   Yes Historical Provider, MD  nitroGLYCERIN (NITROSTAT) 0.4 MG SL tablet Place 1 tablet (0.4 mg total) under the tongue every 5 (five) minutes as needed for chest pain. Patient not taking: Reported on 07/07/2016 12/02/13   Lysbeth Penner, FNP  pravastatin (PRAVACHOL) 40 MG tablet TAKE 1 TABLET BY MOUTH EVERY DAY 04/11/16   Historical Provider, MD  warfarin (COUMADIN) 1 MG tablet TAKE 1-3 TABLETS BY MOUTH ONCE DAILY AS DIRECTED BY ANTICOAGULATION CLINIC. Patient taking differently: Takes 2 tabs every other day and 3 tabs rest of time (1mg  per tab). 05/16/15   Timmothy Euler, MD     Allergies Atorvastatin and Sertraline  Family History  Problem Relation Age of Onset  . Cancer Mother     pancreatic  . Heart disease Father   . Diabetes Father     Social History Social History  Substance Use Topics  . Smoking status: Never Smoker  . Smokeless tobacco: Never Used  . Alcohol use No    Review of Systems Constitutional:Positive fevers and chills Eyes: No visual changes. ENT: No sore throat. Cardiovascular: Denies chest pain. Respiratory: Denies shortness of breath. Gastrointestinal: No abdominal pain.  No nausea, no vomiting.  No diarrhea.  No constipation. Genitourinary: Negative for dysuria. Musculoskeletal: Negative for back pain. Skin: Negative for rash. Neurological: Negative for headaches, focal weakness or numbness.  10-point ROS otherwise negative.  ____________________________________________   PHYSICAL EXAM:  VITAL SIGNS: ED Triage Vitals [07/17/16 1457]  Enc Vitals Group     BP (!) 166/63     Pulse Rate 82     Resp 18  Temp (!) 100.4 F (38 C)     Temp Source Oral     SpO2 98 %     Weight 186 lb (84.4 kg)     Height 5\' 10"  (1.778 m)     Head Circumference      Peak Flow      Pain Score 0     Pain Loc      Pain Edu?      Excl. in [place]?     Constitutional: Alert and oriented x 4 well appearing nontoxic no diaphoresis speaks in full, clear sentences Eyes: PERRL EOMI. Head: Atraumatic. Nose: No congestion/rhinnorhea. Mouth/Throat: No trismusUvula midline no pharyngeal erythema or exudate no lymphadenopathy Neck: No stridor.   Cardiovascular: Normal rate, regular rhythm. 3/6 diastolic murmur best heard at left sternal border Respiratory: Normal respiratory effort.  No retractions. Lungs CTAB and moving good air Gastrointestinal: Soft nondistended nontender no rebound no guarding no peritonitis no McBurney's tenderness negative Rovsing's no costovertebral tenderness negative Murphy's Musculoskeletal: No lower extremity  edema  fistula in place to left upper extremity with good thrill Neurologic:  Normal speech and language. No gross focal neurologic deficits are appreciated. Skin:  Skin is warm, dry and intact. No rash noted. Psychiatric: Mood and affect are normal. Speech and behavior are normal.    ____________________________________________   DIFFERENTIAL  [facility] spotted fever, endocarditis, influenza, bacteremia, urinary tract infection ____________________________________________   LABS (all labs ordered are listed, but only abnormal results are displayed)  Labs Reviewed  COMPREHENSIVE METABOLIC PANEL - Abnormal; Notable for the following:       Result Value   Chloride 96 (*)    Glucose, Bld 127 (*)    BUN 21 (*)    Creatinine, Ser 5.37 (*)    Albumin 3.0 (*)    ALT 13 (*)    Alkaline Phosphatase 191 (*)    GFR calc non Af Amer 11 (*)    GFR calc Af Amer 12 (*)    All other components within normal limits  TROPONIN I - Abnormal; Notable for the following:    Troponin I 0.06 (*)    All other components within normal limits  CBC WITH DIFFERENTIAL/PLATELET - Abnormal; Notable for the following:    RBC 2.84 (*)    Hemoglobin 9.7 (*)    HCT 29.0 (*)    MCV 102.1 (*)    RDW 16.5 (*)    Eosinophils Absolute 0.9 (*)    All other components within normal limits  URINALYSIS, ROUTINE W REFLEX MICROSCOPIC - Abnormal; Notable for the following:    Color, Urine AMBER (*)    APPearance CLOUDY (*)    Glucose, UA 50 (*)    Protein, ur 100 (*)    Squamous Epithelial / LPF 0-5 (*)    All other components within normal limits  PROTIME-INR - Abnormal; Notable for the following:    Prothrombin Time 27.0 (*)    All other components within normal limits  PROTIME-INR - Abnormal; Notable for the following:    Prothrombin Time 26.0 (*)    All other components within normal limits  CULTURE, BLOOD (ROUTINE X 2)  CULTURE, BLOOD (ROUTINE X 2)  MRSA PCR SCREENING  LACTIC ACID, PLASMA   PROCALCITONIN  INFLUENZA PANEL BY PCR (TYPE A & B)  CK  ROCKY MTN SPOTTED FVR ABS PNL(IGG+IGM)  HIV ANTIBODY (ROUTINE TESTING)  QUANTIFERON TB GOLD ASSAY (BLOOD)    Labs unremarkable aside from elevated pro calcitonin __________________________________________  EKG  ED ECG REPORT I, Darel Hong, the attending physician, personally viewed and interpreted this ECG.  Date: 07/17/2016 Rate: 81 Rhythm: normal sinus rhythm QRS Axis: normal Intervals: Prolonged QTC and first-degree AV block ST/T Wave abnormalities: normal Conduction Disturbances: Right bundle branch block Narrative Interpretation: Abnormal but consistent with old from February 30th 2014 no signs of acute ischemia  ____________________________________________  RADIOLOGY  Chest x-ray with no acute disease ____________________________________________   PROCEDURES  Procedure(s) performed: no  Procedures  Critical Care performed: yes  CRITICAL CARE Performed by: Darel Hong   Total critical care time: 35 minutes  Critical care time was exclusive of separately billable procedures and treating other patients.  Critical care was necessary to treat or prevent imminent or life-threatening deterioration.  Critical care was time spent personally by me on the following activities: development of treatment plan with patient and/or surrogate as well as nursing, discussions with consultants, evaluation of patient's response to treatment, examination of patient, obtaining history from patient or surrogate, ordering and performing treatments and interventions, ordering and review of laboratory studies, ordering and review of radiographic studies, pulse oximetry and re-evaluation of patient's condition.   ____________________________________________   INITIAL IMPRESSION / ASSESSMENT AND PLAN / ED COURSE  Pertinent labs & imaging results that were available during my care of the patient were reviewed by me and  considered in my medical decision making (see chart for details).  The patient hasn't had a low-grade fever for the past 2 weeks without a clear source. He mostly reports myalgias fatigue although does have a dry cough now. He has hemodialysis and a prosthetic valve and currently has a heart murmur which raises the concern for subacute endocarditis. I will start him on broad antibiotics including doxycycline and given his difficulty in outdoor exposure. At this point he requires inpatient admission for fever of unknown origin and for an echocardiogram. While the patient does have a fever today he has no signs of frank sepsis. Regardless he requires inpatient admission for IV antibiotics, echocardiogram, infectious disease consultation.      ____________________________________________   FINAL CLINICAL IMPRESSION(S) / ED DIAGNOSES  Final diagnoses:  Fever of unknown origin      NEW MEDICATIONS STARTED DURING THIS VISIT:  Current Discharge Medication List       Note:  This document was prepared using Dragon voice recognition software and may include unintentional dictation errors.     Darel Hong, MD 07/18/16 1626

## 2016-07-17 NOTE — ED Triage Notes (Signed)
Pt brought over by [facility]. Pt is a dialysis pt. Pt reports fever up to 101.6 for three weeks. Denies NVD. Pt states over the weekend he developed a non-productive cough and a "weird feeling in his chest".

## 2016-07-17 NOTE — ED Notes (Signed)
Pt produces little to no urine, pt attempted to provide urine specimen but was unable to at this time.

## 2016-07-17 NOTE — Progress Notes (Signed)
MD notified of BP and temp. Orders for hydralazine q8h prn for elevated BP, and to also give another 650mg  tylenol x 1 now. Will give and continue to monitor.

## 2016-07-17 NOTE — ED Notes (Signed)
Patient transported to X-ray 

## 2016-07-17 NOTE — Progress Notes (Signed)
Patient ID: Jeremy [name], male   DOB: [birth year], 61 y.o.   MRN: [id number]    No charge for labs or visit - INR check through home monitoring system

## 2016-07-18 ENCOUNTER — Observation Stay: Payer: Medicare Other

## 2016-07-18 ENCOUNTER — Observation Stay (HOSPITAL_BASED_OUTPATIENT_CLINIC_OR_DEPARTMENT_OTHER)
Admit: 2016-07-18 | Discharge: 2016-07-18 | Disposition: A | Discharge status: Home / Self Care | Payer: Medicare Other | Attending: Internal Medicine | Admitting: Internal Medicine

## 2016-07-18 DIAGNOSIS — D721 Eosinophilia: Secondary | ICD-10-CM | POA: Diagnosis not present

## 2016-07-18 DIAGNOSIS — D649 Anemia, unspecified: Secondary | ICD-10-CM | POA: Diagnosis not present

## 2016-07-18 DIAGNOSIS — R011 Cardiac murmur, unspecified: Secondary | ICD-10-CM | POA: Diagnosis not present

## 2016-07-18 DIAGNOSIS — R161 Splenomegaly, not elsewhere classified: Secondary | ICD-10-CM | POA: Diagnosis not present

## 2016-07-18 DIAGNOSIS — I700 Atherosclerosis of aorta: Secondary | ICD-10-CM | POA: Diagnosis not present

## 2016-07-18 DIAGNOSIS — N186 End stage renal disease: Secondary | ICD-10-CM | POA: Diagnosis not present

## 2016-07-18 DIAGNOSIS — I359 Nonrheumatic aortic valve disorder, unspecified: Secondary | ICD-10-CM | POA: Diagnosis not present

## 2016-07-18 DIAGNOSIS — Z952 Presence of prosthetic heart valve: Secondary | ICD-10-CM | POA: Diagnosis not present

## 2016-07-18 DIAGNOSIS — R509 Fever, unspecified: Secondary | ICD-10-CM | POA: Diagnosis not present

## 2016-07-18 LAB — URINALYSIS, ROUTINE W REFLEX MICROSCOPIC
Bacteria, UA: NONE SEEN
Bilirubin Urine: NEGATIVE
GLUCOSE, UA: 50 mg/dL — AB
HGB URINE DIPSTICK: NEGATIVE
Ketones, ur: NEGATIVE mg/dL
LEUKOCYTES UA: NEGATIVE
NITRITE: NEGATIVE
Protein, ur: 100 mg/dL — AB
Specific Gravity, Urine: 1.03 (ref 1.005–1.030)
pH: 8 (ref 5.0–8.0)

## 2016-07-18 LAB — ECHOCARDIOGRAM COMPLETE
Height: 70 in
WEIGHTICAEL: 2976 [oz_av]

## 2016-07-18 LAB — SEDIMENTATION RATE: SED RATE: 107 mm/h — AB (ref 0–20)

## 2016-07-18 LAB — PROTIME-INR
INR: 2.33
Prothrombin Time: 26 seconds — ABNORMAL HIGH (ref 11.4–15.2)

## 2016-07-18 LAB — ROCKY MTN SPOTTED FVR ABS PNL(IGG+IGM)
RMSF IgG: NEGATIVE
RMSF IgM: 0.64 index (ref 0.00–0.89)

## 2016-07-18 MED ORDER — IOPAMIDOL (ISOVUE-300) INJECTION 61%
100.0000 mL | Freq: Once | INTRAVENOUS | Status: AC | PRN
  Administered 2016-07-18: 100 mL via INTRAVENOUS

## 2016-07-18 MED ORDER — IOPAMIDOL (ISOVUE-300) INJECTION 61%
15.0000 mL | INTRAVENOUS | Status: AC
  Administered 2016-07-18 (×2): 15 mL via ORAL

## 2016-07-18 MED ORDER — IBUPROFEN 200 MG PO TABS
100.0000 mg | ORAL_TABLET | ORAL | Status: DC | PRN
  Administered 2016-07-18: 100 mg via ORAL
  Filled 2016-07-18 (×2): qty 1

## 2016-07-18 MED ORDER — BENZONATATE 100 MG PO CAPS
100.0000 mg | ORAL_CAPSULE | Freq: Three times a day (TID) | ORAL | Status: DC
  Administered 2016-07-18 – 2016-07-22 (×11): 100 mg via ORAL
  Filled 2016-07-18 (×12): qty 1

## 2016-07-18 MED ORDER — PIPERACILLIN-TAZOBACTAM 3.375 G IVPB
3.3750 g | Freq: Two times a day (BID) | INTRAVENOUS | Status: DC
  Administered 2016-07-18 – 2016-07-21 (×7): 3.375 g via INTRAVENOUS
  Filled 2016-07-18 (×8): qty 50

## 2016-07-18 MED ORDER — VANCOMYCIN HCL IN DEXTROSE 1-5 GM/200ML-% IV SOLN
1000.0000 mg | INTRAVENOUS | Status: DC
  Administered 2016-07-19: 1000 mg via INTRAVENOUS
  Filled 2016-07-18 (×2): qty 200

## 2016-07-18 NOTE — Progress Notes (Signed)
*  PRELIMINARY RESULTS* Echocardiogram 2D Echocardiogram has been performed.  Jeremy [name] 07/18/2016, 12:55 PM

## 2016-07-18 NOTE — Progress Notes (Signed)
[facility] for Warfarin/Vancomycin/Zosyn Indication: atrial fibrillation/fever  Allergies  Allergen Reactions  . Atorvastatin Other (See Comments)    Muscle Aches  . Sertraline Rash    Patient Measurements: Height: 5\' 10"  (177.8 cm) Weight: 186 lb (84.4 kg) IBW/kg (Calculated) : 73   Vital Signs: Temp: 98.9 F (37.2 C) (04/13 1210) Temp Source: Oral (04/13 1210) BP: 128/52 (04/13 1210) Pulse Rate: 69 (04/13 1210)  Labs:  Recent Labs  07/16/16 2200 07/17/16 1527 07/17/16 1655 07/18/16 0533  HGB  --  9.7*  --   --   HCT  --  29.0*  --   --   PLT  --  185  --   --   LABPROT  --   --  27.0* 26.0*  INR 2.7  --  2.45 2.33  CREATININE  --  5.37*  --   --   CKTOTAL  --  51  --   --   TROPONINI  --  0.06*  --   --     Medical History: Past Medical History:  Diagnosis Date  . Anemia   . Arthritis   . Blood transfusion without reported diagnosis   . Cancer ([place])   . Chronic anticoagulation 06/2012  . Dialysis patient [facility]) 01 01 2008  . Hx of colonoscopy    2009 by Dr. Laural Golden. Normal except for small submucosal lipoma. No evidence of polyps or colitis.  . Hyperlipidemia   . Hypertension   . 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 Patient received doxycycline x 1 and is ordered vancomycin and Zosyn.   Goal of Therapy:  INR 2-3   Vancomycin pre-HD level 15-25 mcg/ml  Resolution of Infection   Plan:  1. Will continue outpatient dosing of warfarin 2 mg daily except 3 mg on Sunday for now and follow INR closely due to potential for interaction with antibiotics.   2. Zosyn 3.375 g EI q 12 hours.    Vancomycin 1000 mg iv once given 4/12. Will give an additional 1000 mg today for a total loading dose of 2000 mg. Will order vancomycin 1000 mg iv q HD and plan on checking a pre-HD level prior to the 3rd dialysis session with a goal of 15-25 mcg/ml.     Recent Labs Lab 07/17/16 1527  WBC 7.1  CREATININE 5.37*  LATICACIDVEN 1.5    Estimated Creatinine Clearance: 15.3 mL/min (A) (by C-G formula based on SCr of 5.37 mg/dL (H)).    Allergies  Allergen Reactions  . Atorvastatin Other (See Comments)    Muscle Aches  . Sertraline Rash    Antimicrobials this admission: doxycycline 4/12 x 1 4/12 vancomycin >>  4/12 Zosyn >>  Dose adjustments this admission:   Microbiology results: 4/12 BCx: NGTD 4/12 MRSA PCR: negative  Thank you for allowing pharmacy to be a part of this patient's care.  Ulice Dash D 07/18/2016 3:25 PM

## 2016-07-18 NOTE — Care Management Obs Status (Signed)
[facility] NOTIFICATION   Patient Details  Name: Jeremy [name] MRN: [id number] Date of Birth: 1955/05/26   Medicare Observation Status Notification Given:  Yes    Beverly Sessions, RN 07/18/2016, 4:25 PM

## 2016-07-18 NOTE — Progress Notes (Signed)
[facility] [place] NAME: Jeremy [name]    MR#:  [id number]  DATE OF BIRTH:  04-16-1955  SUBJECTIVE:  CHIEF COMPLAINT:   Chief Complaint  Patient presents with  . Fever  . Cough   -Admitted with fevers of unknown origin for almost 2 weeks now. MAXIMUM TEMPERATURE of 102.48F last night. -Low-grade temperature today. Denies any recent travel.  REVIEW OF SYSTEMS:  Review of Systems  Constitutional: Positive for fever and malaise/fatigue. Negative for chills.  HENT: Negative for ear discharge, hearing loss and nosebleeds.   Eyes: Negative for blurred vision and double vision.  Respiratory: Negative for cough, shortness of breath and wheezing.   Cardiovascular: Negative for chest pain, palpitations and leg swelling.  Gastrointestinal: Negative for abdominal pain, constipation, diarrhea, nausea and vomiting.  Genitourinary: Negative for dysuria.  Musculoskeletal: Negative for myalgias.  Neurological: Negative for dizziness, speech change, focal weakness, seizures and headaches.  Psychiatric/Behavioral: Negative for depression.    DRUG ALLERGIES:   Allergies  Allergen Reactions  . Atorvastatin Other (See Comments)    Muscle Aches  . Sertraline Rash    VITALS:  Blood pressure (!) 128/52, pulse 69, temperature 98.9 F (37.2 C), temperature source Oral, resp. rate 18, height 5\' 10"  (1.778 m), weight 84.4 kg (186 lb), SpO2 96 %.  PHYSICAL EXAMINATION:  Physical Exam  GENERAL:  61 y.o.-year-old patient lying in the bed with no acute distress.  EYES: Pupils equal, round, reactive to light and accommodation. No scleral icterus. Extraocular muscles intact.  HEENT: Head atraumatic, normocephalic. Oropharynx and nasopharynx clear.  NECK:  Supple, no jugular venous distention. No thyroid enlargement, no tenderness.  LUNGS: Normal breath sounds bilaterally, no wheezing, rales,rhonchi or crepitation. No use of accessory muscles of respiration.  Decreased bibasilar breath sounds noted.  CARDIOVASCULAR: S1, S2 normal. No  rubs, or gallops. 3/6 systolic murmur present ABDOMEN: Soft, nontender, nondistended. Bowel sounds present. No organomegaly or mass.  EXTREMITIES: No pedal edema, cyanosis, or clubbing.  Left forearm AV fistula with good thrill. No erythema or tenderness noted NEUROLOGIC: Cranial nerves II through XII are intact. Muscle strength 5/5 in all extremities. Sensation intact. Gait not checked.  PSYCHIATRIC: The patient is alert and oriented x 3.  SKIN: No obvious rash, lesion, or ulcer. No rash noted.   LABORATORY PANEL:   CBC  Recent Labs Lab 07/17/16 1527  WBC 7.1  HGB 9.7*  HCT 29.0*  PLT 185   ------------------------------------------------------------------------------------------------------------------  Chemistries   Recent Labs Lab 07/17/16 1527  NA 136  K 4.0  CL 96*  CO2 29  GLUCOSE 127*  BUN 21*  CREATININE 5.37*  CALCIUM 8.9  AST 25  ALT 13*  ALKPHOS 191*  BILITOT 0.8   ------------------------------------------------------------------------------------------------------------------  Cardiac Enzymes  Recent Labs Lab 07/17/16 1527  TROPONINI 0.06*   ------------------------------------------------------------------------------------------------------------------  RADIOLOGY:  Dg Chest 2 View  Result Date: 07/17/2016 CLINICAL DATA:  Fever. EXAM: CHEST  2 VIEW COMPARISON:  Radiographs of January 06, 2008. FINDINGS: Stable cardiomediastinal silhouette. Status post aortic valve repair. Stable mild central pulmonary vascular congestion is noted. No pneumothorax or pleural effusion is noted. No acute pulmonary disease is noted. Old right rib fracture is noted. IMPRESSION: Stable central pulmonary vascular congestion. No other significant abnormality seen. Electronically Signed   By: Marijo Conception, M.D.   On: 07/17/2016 16:25    EKG:   Orders placed or performed during the hospital  encounter of 07/17/16  . ED EKG 12-Lead  .  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  #1 fevers of unknown origin.-Urine analysis with no infection, chest x-ray with no evidence of any infection. -Blood cultures are pending at this time. MAXIMUM TEMPERATURE of 102.61F last night. -Restarted vancomycin and Zosyn for now. -Chest thoracic echocardiogram pending to rule out vegetations on his artificial aortic valve. -Infectious disease consult requested. -Denies any back pain at this time. 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 Currently restarted back on dialysis for more than one year. -Nephrology consulted for the same. -Has left forearm AV fistula. On Tuesday Thursday, Saturday dialysis schedule  #3 anemia of chronic disease-hemoglobin is at 9.7. Monitor closely. Continue Epogen with dialysis.  #4 history of aortic valve replacement-with mechanical valve. Continue Coumadin. INR is at 2.3. Pharmacy to adjust the dose.  #5 CAD-stable. Patient on Coreg, statin. History of stent in the past.  #6 DVT prophylaxis-continue Coumadin   Patient is very active and ambulatory at baseline.    All the records are reviewed and case discussed with Care Management/Social Workerr. Management plans discussed with the patient, family and they are in agreement.  CODE STATUS: Full Code  TOTAL TIME TAKING CARE OF THIS PATIENT: 37 minutes.   POSSIBLE D/C IN 2 DAYS, DEPENDING ON CLINICAL CONDITION.   Gladstone Lighter M.D on 07/18/2016 at 1:53 PM  Between 7am to 6pm - Pager -  [phone]  After 6pm go to [email or web address] - password [email or web address]  Office  [phone]  CC: Primary care physician; Kenn File, MD

## 2016-07-18 NOTE — Consult Note (Addendum)
[facility] Infectious Disease     Reason for Consult: Fever,     Referring Physician: Claria Dice Date of Admission:  07/17/2016   Active Problems:   Fever   HPI: Jeremy [name] is a 61 y.o. male admitted with 2 weeks of fevers to 101.3.  He has a history of end-stage renal disease on dialysis as well as aortic valve replacement.  He received dialysis through an AV graft in his left forearm.  On admission his white count was 7.7 temperature 100.4.  He has had fever spikes to 102.6. Blood cultures have been negative.  Has been started on vancomycin and Zosyn and doxycycline.  Chest x-ray showed no pneumonia. He has a long hx of ESRD form IGA and has had 2 transplants. Had second one fail in June and was tapered off his meds just 2 weeks.  See taper instruction below.   Aug 2017 recs from renal transplant -  Stop myfortic today December 1, decrease prograf to 1 mg twice daily Jan 1, decrease prograf to 1 mg once daily Feb 1, start taking prednisone 5 mg every other day. Do that for 2 months, through March. April 1, will be off of all immunos.    Past Medical History:  Diagnosis Date  . Anemia   . Arthritis   . Blood transfusion without reported diagnosis   . Cancer ([place])   . Chronic anticoagulation 06/2012  . Dialysis patient [facility]) 01 01 2008  . Hx of colonoscopy    2009 by Dr. Laural Golden. Normal except for small submucosal lipoma. No evidence of polyps or colitis.  . Hyperlipidemia   . Hypertension   . 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  Marland Kitchen OSA (obstructive sleep apnea)    Past Surgical History:  Procedure Laterality Date  . AORTIC VALVE REPLACEMENT  3 /11 /2014   At [place]    . CORONARY ANGIOPLASTY WITH STENT PLACEMENT    . DG AV DIALYSIS  SHUNT ACCESS  EXIST*L* OR     left arm  . KIDNEY TRANSPLANT     Social History  Substance Use Topics  . Smoking status: Never Smoker  . Smokeless tobacco: Never Used  . Alcohol use No   Family History  Problem Relation Age of Onset  . Cancer Mother     pancreatic  . Heart disease Father   . Diabetes Father     Allergies:  Allergies  Allergen Reactions  . Atorvastatin Other (See Comments)    Muscle Aches  . Sertraline Rash    Current antibiotics: Antibiotics Given (last 72 hours)    None      MEDICATIONS: . allopurinol  200 mg Oral Daily  . amLODipine  5 mg Oral QPM  . benzonatate  100 mg Oral TID  . carvedilol  12.5 mg Oral BID  . dicyclomine  20 mg Oral BID  . magnesium oxide  400 mg Oral BID  . pantoprazole  40 mg Oral Daily  . pravastatin  40 mg Oral Daily  . sevelamer carbonate  0.8 g Oral TID PC  . sodium chloride flush  3 mL Intravenous Q12H  . warfarin  2 mg Oral Once per day on Mon Tue Wed Thu Fri Sat   And  . [START ON 07/20/2016]  warfarin  3 mg Oral Once per day on Sun  . Warfarin - Physician Dosing Inpatient   Does not apply q1800    Review of Systems - 11 systems reviewed and negative per HPI   OBJECTIVE: Temp:  [98.7 F (37.1 C)-102.6 F (39.2 C)] 98.9 F (37.2 C) (04/13 1210) Pulse Rate:  [69-92] 69 (04/13 1210) Resp:  [13-24] 18 (04/13 1210) BP: (128-189)/(52-80) 128/52 (04/13 1210) SpO2:  [88 %-99 %] 96 % (04/13 1210) Weight:  [84.4 kg (186 lb)] 84.4 kg (186 lb) (04/12 1457) Physical Exam  Constitutional: He is oriented to person, place, and time. He appears well-developed and well-nourished. No distress.  HENT: anicteric,  Mouth/Throat: Oropharynx is clear and moist. No oropharyngeal exudate.  Cardiovascular: Normal rate, regular rhythm and normal heart sounds. mech HS Pulmonary/Chest: Effort normal and breath sounds normal. No respiratory distress. He has no wheezes.  Abdominal: Soft. Bowel sounds are normal. He exhibits no distension. There is  no tenderness.  Lymphadenopathy: He has no cervical adenopathy.  Neurological: He is alert and oriented to person, place, and time.  Skin: Skin is warm and dry. No rash noted. No erythema. LUE avf site wnl Psychiatric: He has a normal mood and affect. His behavior is normal.   LABS: Results for orders placed or performed during the hospital encounter of 07/17/16 (from the past 48 hour(s))  Lactic acid, plasma     Status: None   Collection Time: 07/17/16  3:27 PM  Result Value Ref Range   Lactic Acid, Venous 1.5 0.5 - 1.9 mmol/L  Comprehensive metabolic panel     Status: Abnormal   Collection Time: 07/17/16  3:27 PM  Result Value Ref Range   Sodium 136 135 - 145 mmol/L   Potassium 4.0 3.5 - 5.1 mmol/L   Chloride 96 (L) 101 - 111 mmol/L   CO2 29 22 - 32 mmol/L   Glucose, Bld 127 (H) 65 - 99 mg/dL   BUN 21 (H) 6 - 20 mg/dL   Creatinine, Ser 5.37 (H) 0.61 - 1.24 mg/dL   Calcium 8.9 8.9 - 10.3 mg/dL   Total Protein 7.9 6.5 - 8.1 g/dL   Albumin 3.0 (L) 3.5 - 5.0 g/dL   AST 25 15 - 41 U/L   ALT 13 (L) 17 - 63 U/L   Alkaline Phosphatase 191 (H) 38 - 126 U/L   Total Bilirubin 0.8 0.3 - 1.2 mg/dL   GFR calc non Af Amer 11 (L) >60 mL/min   GFR calc Af Amer 12 (L) >60 mL/min    Comment: (NOTE) The eGFR has been calculated using the CKD EPI equation. This calculation has not been validated in all clinical situations. eGFR's persistently <60 mL/min signify possible Chronic Kidney Disease.    Anion gap 11 5 - 15  Troponin I     Status: Abnormal   Collection Time: 07/17/16  3:27 PM  Result Value Ref Range   Troponin I 0.06 (HH) <0.03 ng/mL    Comment: CRITICAL RESULT CALLED TO, READ BACK BY AND VERIFIED WITH ASHLEY SMITH 07/17/16 @ [place]   CBC WITH DIFFERENTIAL     Status: Abnormal   Collection Time: 07/17/16  3:27 PM  Result Value Ref Range   WBC 7.1 3.8 - 10.6 K/uL   RBC 2.84 (L) 4.40 - 5.90 MIL/uL   Hemoglobin 9.7 (L) 13.0 - 18.0 g/dL   HCT 29.0 (L) 40.0 - 52.0 %   MCV 102.1  (H) 80.0 - 100.0 fL  MCH 34.0 26.0 - 34.0 pg   MCHC 33.3 32.0 - 36.0 g/dL   RDW 16.5 (H) 11.5 - 14.5 %   Platelets 185 150 - 440 K/uL   Neutrophils Relative % 52 %   Neutro Abs 3.7 1.4 - 6.5 K/uL   Lymphocytes Relative 25 %   Lymphs Abs 1.8 1.0 - 3.6 K/uL   Monocytes Relative 10 %   Monocytes Absolute 0.7 0.2 - 1.0 K/uL   Eosinophils Relative 12 %   Eosinophils Absolute 0.9 (H) 0 - 0.7 K/uL   Basophils Relative 1 %   Basophils Absolute 0.0 0 - 0.1 K/uL  Procalcitonin     Status: None   Collection Time: 07/17/16  3:27 PM  Result Value Ref Range   Procalcitonin 1.61 ng/mL    Comment:        Interpretation: PCT > 0.5 ng/mL and <= 2 ng/mL: Systemic infection (sepsis) is possible, but other conditions are known to elevate PCT as well. (NOTE)         ICU PCT Algorithm               Non ICU PCT Algorithm    ----------------------------     ------------------------------         PCT < 0.25 ng/mL                 PCT < 0.1 ng/mL     Stopping of antibiotics            Stopping of antibiotics       strongly encouraged.               strongly encouraged.    ----------------------------     ------------------------------       PCT level decrease by               PCT < 0.25 ng/mL       >= 80% from peak PCT       OR PCT 0.25 - 0.5 ng/mL          Stopping of antibiotics                                             encouraged.     Stopping of antibiotics           encouraged.    ----------------------------     ------------------------------       PCT level decrease by              PCT >= 0.25 ng/mL       < 80% from peak PCT        AND PCT >= 0.5 ng/mL             Continuing antibiotics                                              encouraged.       Continuing antibiotics            encouraged.    ----------------------------     ------------------------------     PCT level increase compared          PCT > 0.5 ng/mL         with peak PCT  AND          PCT >= 0.5 ng/mL              Escalation of antibiotics                                          strongly encouraged.      Escalation of antibiotics        strongly encouraged.   Blood Culture (routine x 2)     Status: None (Preliminary result)   Collection Time: 07/17/16  3:27 PM  Result Value Ref Range   Specimen Description BLOOD RIGHT HAND    Special Requests      BOTTLES DRAWN AEROBIC AND ANAEROBIC Blood Culture adequate volume   Culture NO GROWTH < 24 HOURS    Report Status PENDING   Influenza panel by PCR (type A & B)     Status: None   Collection Time: 07/17/16  3:27 PM  Result Value Ref Range   Influenza A By PCR NEGATIVE NEGATIVE   Influenza B By PCR NEGATIVE NEGATIVE    Comment: (NOTE) The Xpert Xpress Flu assay is intended as an aid in the diagnosis of  influenza and should not be used as a sole basis for treatment.  This  assay is FDA approved for nasopharyngeal swab specimens only. Nasal  washings and aspirates are unacceptable for Xpert Xpress Flu testing.   CK     Status: None   Collection Time: 07/17/16  3:27 PM  Result Value Ref Range   Total CK 51 49 - 397 U/L  Blood Culture (routine x 2)     Status: None (Preliminary result)   Collection Time: 07/17/16  3:31 PM  Result Value Ref Range   Specimen Description BLOOD LEFT HAND    Special Requests      BOTTLES DRAWN AEROBIC AND ANAEROBIC Blood Culture adequate volume   Culture NO GROWTH < 24 HOURS    Report Status PENDING   Urinalysis, Routine w reflex microscopic     Status: Abnormal   Collection Time: 07/17/16  3:31 PM  Result Value Ref Range   Color, Urine AMBER (A) YELLOW    Comment: BIOCHEMICALS MAY BE AFFECTED BY COLOR   APPearance CLOUDY (A) CLEAR   Specific Gravity, Urine 1.030 1.005 - 1.030   pH 8.0 5.0 - 8.0   Glucose, UA 50 (A) NEGATIVE mg/dL   Hgb urine dipstick NEGATIVE NEGATIVE   Bilirubin Urine NEGATIVE NEGATIVE   Ketones, ur NEGATIVE NEGATIVE mg/dL   Protein, ur 100 (A) NEGATIVE mg/dL   Nitrite NEGATIVE NEGATIVE    Leukocytes, UA NEGATIVE NEGATIVE   RBC / HPF TOO NUMEROUS TO COUNT 0 - 5 RBC/hpf   WBC, UA TOO NUMEROUS TO COUNT 0 - 5 WBC/hpf   Bacteria, UA NONE SEEN NONE SEEN   Squamous Epithelial / LPF 0-5 (A) NONE SEEN   WBC Clumps PRESENT    Mucous PRESENT   Protime-INR     Status: Abnormal   Collection Time: 07/17/16  4:55 PM  Result Value Ref Range   Prothrombin Time 27.0 (H) 11.4 - 15.2 seconds   INR 2.45   MRSA PCR Screening     Status: None   Collection Time: 07/17/16  6:38 PM  Result Value Ref Range   MRSA by PCR NEGATIVE NEGATIVE    Comment:  The GeneXpert MRSA Assay (FDA approved for NASAL specimens only), is one component of a comprehensive MRSA colonization surveillance program. It is not intended to diagnose MRSA infection nor to guide or monitor treatment for MRSA infections.   Protime-INR     Status: Abnormal   Collection Time: 07/18/16  5:33 AM  Result Value Ref Range   Prothrombin Time 26.0 (H) 11.4 - 15.2 seconds   INR 2.33    No components found for: ESR, C REACTIVE PROTEIN MICRO: Recent Results (from the past 720 hour(s))  Blood Culture (routine x 2)     Status: None (Preliminary result)   Collection Time: 07/17/16  3:27 PM  Result Value Ref Range Status   Specimen Description BLOOD RIGHT HAND  Final   Special Requests   Final    BOTTLES DRAWN AEROBIC AND ANAEROBIC Blood Culture adequate volume   Culture NO GROWTH < 24 HOURS  Final   Report Status PENDING  Incomplete  Blood Culture (routine x 2)     Status: None (Preliminary result)   Collection Time: 07/17/16  3:31 PM  Result Value Ref Range Status   Specimen Description BLOOD LEFT HAND  Final   Special Requests   Final    BOTTLES DRAWN AEROBIC AND ANAEROBIC Blood Culture adequate volume   Culture NO GROWTH < 24 HOURS  Final   Report Status PENDING  Incomplete  MRSA PCR Screening     Status: None   Collection Time: 07/17/16  6:38 PM  Result Value Ref Range Status   MRSA by PCR NEGATIVE NEGATIVE  Final    Comment:        The GeneXpert MRSA Assay (FDA approved for NASAL specimens only), is one component of a comprehensive MRSA colonization surveillance program. It is not intended to diagnose MRSA infection nor to guide or monitor treatment for MRSA infections.     IMAGING: Dg Chest 2 View  Result Date: 07/17/2016 CLINICAL DATA:  Fever. EXAM: CHEST  2 VIEW COMPARISON:  Radiographs of January 06, 2008. FINDINGS: Stable cardiomediastinal silhouette. Status post aortic valve repair. Stable mild central pulmonary vascular congestion is noted. No pneumothorax or pleural effusion is noted. No acute pulmonary disease is noted. Old right rib fracture is noted. IMPRESSION: Stable central pulmonary vascular congestion. No other significant abnormality seen. Electronically Signed   By: Marijo Conception, M.D.   On: 07/17/2016 16:25   Assessment:   JOE GEE is a 61 y.o. male with ESRD, Mechanical AVR, s.p failed renal transplant (restarted HD in June) now with 2 weeks of fevers and body aches. He had recently stopped his statins a month ago due to some myalgias which are a little better. He also finished a prolonged taper off his immunosuppressive on April 1st (stopped pred 5 mg qod after a 2 month taper). He has really minimal other sxs except mild HA and Mild cough. No recent procedures, no recent issues with his LUE AVF fistula.  His TTE is done and pending. He has otpt visit to [place] with [place] done 4/8 -negative and bcx done on admit negative as well.  Interestingly his Eosinophil count was up to 25% on 4/2, 22 % on 4/4  but is now down to 12%. I am concerned for several issues, including endocarditis, transplant rejection, atypical viral infection (however off of immunosuppressives for 2 months), or adrenal insufficiency.  I have spoken with Dr Mauricio Po at [facility] neprhology and have left message with renal transplant at [facility].  Recommendations  Await bcx. Check esr,check cmv pcr Check CT chest  abd pelvis Cont vanco and zosyn Await TTE but if work up negative he will need a TEE on Monday.   Thank you very much for allowing me to participate in the care of this patient. Please call with questions.   Cheral Marker. Ola Spurr, MD

## 2016-07-18 NOTE — Progress Notes (Signed)
Notified Dr. Ola Spurr about patient temp and tolerance of contrast. Receive clearance to send patient to CT and new order for blood culture, in addition to advil. CT made aware. Patient resting in bed. Continue to assess.

## 2016-07-19 DIAGNOSIS — Z955 Presence of coronary angioplasty implant and graft: Secondary | ICD-10-CM | POA: Diagnosis not present

## 2016-07-19 DIAGNOSIS — N2581 Secondary hyperparathyroidism of renal origin: Secondary | ICD-10-CM | POA: Diagnosis present

## 2016-07-19 DIAGNOSIS — E785 Hyperlipidemia, unspecified: Secondary | ICD-10-CM | POA: Diagnosis present

## 2016-07-19 DIAGNOSIS — Z7901 Long term (current) use of anticoagulants: Secondary | ICD-10-CM | POA: Diagnosis not present

## 2016-07-19 DIAGNOSIS — R509 Fever, unspecified: Secondary | ICD-10-CM | POA: Diagnosis present

## 2016-07-19 DIAGNOSIS — N186 End stage renal disease: Secondary | ICD-10-CM | POA: Diagnosis present

## 2016-07-19 DIAGNOSIS — Z952 Presence of prosthetic heart valve: Secondary | ICD-10-CM | POA: Diagnosis not present

## 2016-07-19 DIAGNOSIS — T8612 Kidney transplant failure: Secondary | ICD-10-CM | POA: Diagnosis present

## 2016-07-19 DIAGNOSIS — Z809 Family history of malignant neoplasm, unspecified: Secondary | ICD-10-CM | POA: Diagnosis not present

## 2016-07-19 DIAGNOSIS — Z992 Dependence on renal dialysis: Secondary | ICD-10-CM | POA: Diagnosis not present

## 2016-07-19 DIAGNOSIS — T8611 Kidney transplant rejection: Secondary | ICD-10-CM | POA: Diagnosis present

## 2016-07-19 DIAGNOSIS — G4733 Obstructive sleep apnea (adult) (pediatric): Secondary | ICD-10-CM | POA: Diagnosis present

## 2016-07-19 DIAGNOSIS — Y830 Surgical operation with transplant of whole organ as the cause of abnormal reaction of the patient, or of later complication, without mention of misadventure at the time of the procedure: Secondary | ICD-10-CM | POA: Diagnosis present

## 2016-07-19 DIAGNOSIS — I120 Hypertensive chronic kidney disease with stage 5 chronic kidney disease or end stage renal disease: Secondary | ICD-10-CM | POA: Diagnosis not present

## 2016-07-19 DIAGNOSIS — I359 Nonrheumatic aortic valve disorder, unspecified: Secondary | ICD-10-CM | POA: Diagnosis not present

## 2016-07-19 DIAGNOSIS — Z888 Allergy status to other drugs, medicaments and biological substances status: Secondary | ICD-10-CM | POA: Diagnosis not present

## 2016-07-19 DIAGNOSIS — N028 Recurrent and persistent hematuria with other morphologic changes: Secondary | ICD-10-CM | POA: Diagnosis present

## 2016-07-19 DIAGNOSIS — Z940 Kidney transplant status: Secondary | ICD-10-CM | POA: Diagnosis not present

## 2016-07-19 DIAGNOSIS — I132 Hypertensive heart and chronic kidney disease with heart failure and with stage 5 chronic kidney disease, or end stage renal disease: Secondary | ICD-10-CM | POA: Diagnosis present

## 2016-07-19 DIAGNOSIS — M109 Gout, unspecified: Secondary | ICD-10-CM | POA: Diagnosis present

## 2016-07-19 DIAGNOSIS — I351 Nonrheumatic aortic (valve) insufficiency: Secondary | ICD-10-CM | POA: Diagnosis not present

## 2016-07-19 DIAGNOSIS — Z85528 Personal history of other malignant neoplasm of kidney: Secondary | ICD-10-CM | POA: Diagnosis not present

## 2016-07-19 DIAGNOSIS — M1990 Unspecified osteoarthritis, unspecified site: Secondary | ICD-10-CM | POA: Diagnosis present

## 2016-07-19 DIAGNOSIS — K529 Noninfective gastroenteritis and colitis, unspecified: Secondary | ICD-10-CM | POA: Diagnosis present

## 2016-07-19 DIAGNOSIS — D649 Anemia, unspecified: Secondary | ICD-10-CM | POA: Diagnosis not present

## 2016-07-19 DIAGNOSIS — I2510 Atherosclerotic heart disease of native coronary artery without angina pectoris: Secondary | ICD-10-CM | POA: Diagnosis present

## 2016-07-19 DIAGNOSIS — Z833 Family history of diabetes mellitus: Secondary | ICD-10-CM | POA: Diagnosis not present

## 2016-07-19 DIAGNOSIS — D631 Anemia in chronic kidney disease: Secondary | ICD-10-CM | POA: Diagnosis present

## 2016-07-19 DIAGNOSIS — Z8249 Family history of ischemic heart disease and other diseases of the circulatory system: Secondary | ICD-10-CM | POA: Diagnosis not present

## 2016-07-19 LAB — RESPIRATORY PANEL BY PCR
Adenovirus: NOT DETECTED
Bordetella pertussis: NOT DETECTED
CHLAMYDOPHILA PNEUMONIAE-RVPPCR: NOT DETECTED
CORONAVIRUS 229E-RVPPCR: NOT DETECTED
CORONAVIRUS OC43-RVPPCR: NOT DETECTED
Coronavirus HKU1: NOT DETECTED
Coronavirus NL63: NOT DETECTED
INFLUENZA B-RVPPCR: NOT DETECTED
Influenza A: NOT DETECTED
MYCOPLASMA PNEUMONIAE-RVPPCR: NOT DETECTED
Metapneumovirus: NOT DETECTED
PARAINFLUENZA VIRUS 1-RVPPCR: NOT DETECTED
PARAINFLUENZA VIRUS 2-RVPPCR: NOT DETECTED
PARAINFLUENZA VIRUS 3-RVPPCR: NOT DETECTED
Parainfluenza Virus 4: NOT DETECTED
RESPIRATORY SYNCYTIAL VIRUS-RVPPCR: NOT DETECTED
Rhinovirus / Enterovirus: NOT DETECTED

## 2016-07-19 LAB — BASIC METABOLIC PANEL
ANION GAP: 12 (ref 5–15)
BUN: 57 mg/dL — ABNORMAL HIGH (ref 6–20)
CALCIUM: 8.8 mg/dL — AB (ref 8.9–10.3)
CO2: 29 mmol/L (ref 22–32)
Chloride: 95 mmol/L — ABNORMAL LOW (ref 101–111)
Creatinine, Ser: 9.6 mg/dL — ABNORMAL HIGH (ref 0.61–1.24)
GFR calc non Af Amer: 5 mL/min — ABNORMAL LOW (ref >60)
GFR, EST AFRICAN AMERICAN: 6 mL/min — AB (ref >60)
Glucose, Bld: 103 mg/dL — ABNORMAL HIGH (ref 65–99)
POTASSIUM: 4.6 mmol/L (ref 3.5–5.1)
SODIUM: 136 mmol/L (ref 135–145)

## 2016-07-19 LAB — CBC
HCT: 29 % — ABNORMAL LOW (ref 40.0–52.0)
Hemoglobin: 9.7 g/dL — ABNORMAL LOW (ref 13.0–18.0)
MCH: 34.3 pg — ABNORMAL HIGH (ref 26.0–34.0)
MCHC: 33.5 g/dL (ref 32.0–36.0)
MCV: 102.6 fL — AB (ref 80.0–100.0)
PLATELETS: 158 10*3/uL (ref 150–440)
RBC: 2.83 MIL/uL — AB (ref 4.40–5.90)
RDW: 16.2 % — ABNORMAL HIGH (ref 11.5–14.5)
WBC: 6.5 10*3/uL (ref 3.8–10.6)

## 2016-07-19 LAB — PROTIME-INR
INR: 2.77
PROTHROMBIN TIME: 29.8 s — AB (ref 11.4–15.2)

## 2016-07-19 LAB — HIV ANTIBODY (ROUTINE TESTING W REFLEX): HIV SCREEN 4TH GENERATION: NONREACTIVE

## 2016-07-19 MED ORDER — PREDNISONE 5 MG PO TABS
5.0000 mg | ORAL_TABLET | Freq: Every day | ORAL | Status: DC
  Administered 2016-07-20 – 2016-07-21 (×2): 5 mg via ORAL
  Filled 2016-07-19 (×2): qty 1

## 2016-07-19 MED ORDER — WARFARIN - PHARMACIST DOSING INPATIENT
Freq: Every day | Status: DC
  Administered 2016-07-19 – 2016-07-20 (×2)

## 2016-07-19 MED ORDER — WARFARIN SODIUM 1 MG PO TABS
1.5000 mg | ORAL_TABLET | Freq: Once | ORAL | Status: AC
  Administered 2016-07-19: 1.5 mg via ORAL
  Filled 2016-07-19: qty 1

## 2016-07-19 NOTE — Consult Note (Signed)
[facility]  CONSULT NOTE    Date: 07/19/2016                  Patient Name:  Jeremy [name]  MRN: [id number]  DOB: [birth year]  Age / Sex: 61 y.o., male         PCP: Kenn File, MD                 Service Requesting Consult: Dr. Tressia Miners                 Reason for Consult: End Stage Renal Disease on hemodialysis            History of Present Illness: Jeremy [name] is a 61 y.o. 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 He already has been on his mycophenolate and tacrolimus.   Last dialysis was Thursday. Patient states he has had fever for last few days.    Medications: Outpatient medications: Prescriptions Prior to Admission  Medication Sig Dispense Refill Last Dose  . acetaminophen (TYLENOL) 325 MG tablet Take 650 mg by mouth every 6 (six) hours as needed.   07/17/2016 at 1530  . allopurinol (ZYLOPRIM) 100 MG tablet Take 200 mg by mouth daily.    07/16/2016 at 0800  . amLODipine (NORVASC) 5 MG tablet Take 5 mg by mouth daily.   07/16/2016 at 2100  . carvedilol (COREG) 12.5 MG tablet Take 12.5 mg by mouth 2 (two) times daily.   07/16/2016 at 2100  . dicyclomine (BENTYL) 10 MG capsule Take 2 capsules (20 mg total) by mouth 3 (three) times daily before meals. (Patient taking differently: Take 20 mg by mouth 2 (two) times daily. ) 90 capsule 4 07/16/2016 at 2100  . diphenoxylate-atropine (LOMOTIL) 2.5-0.025 MG tablet Take 1 tablet by mouth 4 (four) times daily as needed for diarrhea or loose stools. 60 tablet 0 prn at prn  . lidocaine-prilocaine (EMLA) cream Apply topically.    prn at prn  . loratadine (CLARITIN) 10 MG tablet Take 10 mg by mouth daily as needed. Reported on 06/12/2015   prn at prn  . magnesium oxide (MAG-OX) 400 (241.3 MG) MG tablet Take 400 mg by mouth 2 (two) times daily.  1 07/16/2016 at 0800  . Omega-3 1000 MG CAPS Take 3 g by mouth.   07/16/2016 at 0800  . omeprazole (PRILOSEC) 20 MG capsule Take 20 mg by mouth daily.   07/16/2016 at 0800  . Sevelamer Carbonate (RENVELA PO) Take 1 tablet by mouth 3 (three) times daily after meals.   07/17/2016 at 1200  . warfarin (COUMADIN) 2 MG tablet Take 2 mg by mouth daily. 2mg  Everyday, except Sunday which is 3mg    07/16/2016 at 2100  . warfarin (COUMADIN) 3 MG tablet Take 3 mg by mouth daily. Only on SUNDAYS   07/13/2016 at 2100  . nitroGLYCERIN (NITROSTAT) 0.4 MG SL tablet Place 1 tablet (0.4 mg total) under the tongue every 5 (five) minutes as needed for chest pain. (Patient not taking: Reported on 07/07/2016) 25 tablet 2 Not Taking at Unknown time  . pravastatin (PRAVACHOL) 40 MG tablet TAKE 1 TABLET BY MOUTH EVERY DAY  Not Taking at Unknown time  . warfarin (COUMADIN) 1 MG tablet TAKE 1-3 TABLETS BY MOUTH ONCE DAILY AS DIRECTED BY ANTICOAGULATION CLINIC. (Patient taking differently: Takes 2 tabs every other day and 3 tabs rest of time (1mg  per tab).) 100 tablet 2 Taking    Current medications: Current Facility-Administered Medications  Medication Dose Route Frequency Provider Last Rate Last Dose  . 0.9 %  sodium chloride infusion  250 mL Intravenous PRN Baxter Hire, MD      . acetaminophen (TYLENOL) tablet 650 mg  650 mg Oral Q6H PRN Baxter Hire, MD   650 mg at 07/18/16 1832  . allopurinol (ZYLOPRIM) tablet 200 mg  200 mg Oral Daily Baxter Hire, MD   200 mg at 07/19/16 0859  . amLODipine (NORVASC) tablet 5 mg  5 mg Oral QPM Baxter Hire, MD   Stopped at 07/18/16 1800  . benzonatate (TESSALON) capsule 100 mg  100 mg Oral TID Gladstone Lighter, MD   100 mg at 07/19/16 1032  . carvedilol (COREG) tablet  12.5 mg  12.5 mg Oral BID Baxter Hire, MD   12.5 mg at 07/18/16 2112  . dicyclomine (BENTYL) capsule 20 mg  20 mg Oral BID Baxter Hire, MD   20 mg at 07/19/16 0858  . hydrALAZINE (APRESOLINE) injection 5 mg  5 mg Intravenous Q8H PRN Alexis Hugelmeyer, DO      . ibuprofen (ADVIL,MOTRIN) tablet 100 mg  100 mg Oral Q4H PRN Leonel Ramsay, MD   100 mg at 07/18/16 2112  . magnesium oxide (MAG-OX) tablet 400 mg  400 mg Oral BID Baxter Hire, MD   400 mg at 07/19/16 1000  . nitroGLYCERIN (NITROSTAT) SL tablet 0.4 mg  0.4 mg Sublingual Q5 min PRN Baxter Hire, MD      . pantoprazole (PROTONIX) EC tablet 40 mg  40 mg Oral Daily Baxter Hire, MD   40 mg at 07/19/16 0858  . piperacillin-tazobactam (ZOSYN) IVPB 3.375 g  3.375 g Intravenous Q12H Napoleon Form, RPH   3.375 g at 07/19/16 0451  . pravastatin (PRAVACHOL) tablet 40 mg  40 mg Oral Daily Baxter Hire, MD      . sevelamer carbonate (RENVELA) packet 0.8 g  0.8 g Oral TID PC Baxter Hire, MD   0.8 g at 07/19/16 0908  . sodium chloride flush (NS) 0.9 % injection 3 mL  3 mL Intravenous Q12H Baxter Hire, MD   3 mL at 07/19/16 1000  . sodium chloride flush (NS) 0.9 % injection 3 mL  3 mL Intravenous PRN Baxter Hire, MD      . vancomycin (VANCOCIN) IVPB 1000 mg/200 mL premix  1,000 mg Intravenous Q T,Th,Sa-HD Scott D Christy, RPH      . warfarin (COUMADIN) tablet 1.5 mg  1.5 mg Oral ONCE-1800 Gladstone Lighter, MD      . Warfarin - Pharmacist Dosing Inpatient   Does not apply [id number] Gladstone Lighter, MD          Allergies: Allergies  Allergen Reactions  . Atorvastatin Other (See Comments)    Muscle Aches  . Sertraline Rash      Past Medical History: Past Medical History:  Diagnosis Date  . Anemia   . Arthritis   . Blood transfusion without reported diagnosis   . Cancer ([place])   . Chronic anticoagulation 06/2012  . Dialysis patient [facility]) 01 01 2008  . Hx of colonoscopy  2009 by Dr. Laural Golden. Normal except  for small submucosal lipoma. No evidence of polyps or colitis.  . Hyperlipidemia   . Hypertension   . Kidney disease    Chronic kidney disease secondary to IgA nephropathy diagnosed when he was 89. 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  Marland Kitchen OSA (obstructive sleep apnea)      Past Surgical History: Past Surgical History:  Procedure Laterality Date  . AORTIC VALVE REPLACEMENT  3 /11 /2014   At [place]    . CORONARY ANGIOPLASTY WITH STENT PLACEMENT    . DG AV DIALYSIS  SHUNT ACCESS EXIST*L* OR     left arm  . KIDNEY TRANSPLANT       Family History: Family History  Problem Relation Age of Onset  . Cancer Mother     pancreatic  . Heart disease Father   . Diabetes Father      Social History: Social History   Social History  . Marital status: Divorced    Spouse name: N/A  . Number of children: N/A  . Years of education: N/A   Occupational History  . courier     [employer]   Social History Main Topics  . Smoking status: Never Smoker  . Smokeless tobacco: Never Used  . Alcohol use No  . Drug use: No  . Sexual activity: Not on file   Other Topics Concern  . Not on file   Social History Narrative   Single   1 daughter 99      Review of Systems: Review of Systems  Constitutional: Positive for chills, diaphoresis and fever. Negative for malaise/fatigue and weight loss.  HENT: Negative.  Negative for congestion, ear discharge, ear pain, hearing loss, nosebleeds, sinus pain, sore throat and tinnitus.   Eyes: Negative.  Negative for blurred vision, double vision, photophobia, pain, discharge and redness.  Respiratory: Negative.  Negative for cough, hemoptysis, sputum production, shortness of breath, wheezing and stridor.   Cardiovascular: Negative.  Negative for chest pain, palpitations, orthopnea, claudication, leg swelling and PND.   Gastrointestinal: Negative.  Negative for abdominal pain, blood in stool, constipation, diarrhea, heartburn, melena, nausea and vomiting.  Genitourinary: Negative.  Negative for dysuria, flank pain, frequency, hematuria and urgency.  Musculoskeletal: Negative for back pain, falls, joint pain, myalgias and neck pain.  Skin: Negative.  Negative for itching and rash.  Neurological: Positive for weakness. Negative for dizziness, tingling, tremors, sensory change, speech change, focal weakness, seizures, loss of consciousness and headaches.  Endo/Heme/Allergies: Negative for environmental allergies and polydipsia. Does not bruise/bleed easily.  Psychiatric/Behavioral: Negative.  Negative for depression, hallucinations, memory loss, substance abuse and suicidal ideas. The patient is not nervous/anxious and does not have insomnia.     Vital Signs: Blood pressure 135/69, pulse 69, temperature 99 F (37.2 C), temperature source Oral, resp. rate 18, height 5\' 10"  (1.778 m), weight 85.2 kg (187 lb 13.3 oz), SpO2 98 %.  Weight trends: Filed Weights   07/17/16 1457 07/19/16 1000  Weight: 84.4 kg (186 lb) 85.2 kg (187 lb 13.3 oz)    Physical Exam: General: NAD, sitting up in bed  Head: Normocephalic, atraumatic. Moist oral mucosal membranes  Eyes: Anicteric, PERRL  Neck: Supple, trachea midline  Lungs:  Clear to auscultation  Heart: Regular rate and rhythm  Abdomen:  Soft, nontender, nontender allograft kidney RLQ  Extremities: no peripheral edema.  Neurologic: Nonfocal,  moving all four extremities  Skin: No lesions  Access: Left AVF     Lab results: Basic Metabolic Panel:  Recent Labs Lab 07/17/16 1527 07/19/16 0357  NA 136 136  K 4.0 4.6  CL 96* 95*  CO2 29 29  GLUCOSE 127* 103*  BUN 21* 57*  CREATININE 5.37* 9.60*  CALCIUM 8.9 8.8*    Liver Function Tests:  Recent Labs Lab 07/17/16 1527  AST 25  ALT 13*  ALKPHOS 191*  BILITOT 0.8  PROT 7.9  ALBUMIN 3.0*   No  results for input(s): LIPASE, AMYLASE in the last 168 hours. No results for input(s): AMMONIA in the last 168 hours.  CBC:  Recent Labs Lab 07/17/16 1527 07/19/16 0357  WBC 7.1 6.5  NEUTROABS 3.7  --   HGB 9.7* 9.7*  HCT 29.0* 29.0*  MCV 102.1* 102.6*  PLT 185 158    Cardiac Enzymes:  Recent Labs Lab 07/17/16 1527  CKTOTAL 51  TROPONINI 0.06*    BNP: Invalid input(s): POCBNP  CBG: No results for input(s): GLUCAP in the last 168 hours.  Microbiology: Results for orders placed or performed during the hospital encounter of 07/17/16  Blood Culture (routine x 2)     Status: None (Preliminary result)   Collection Time: 07/17/16  3:27 PM  Result Value Ref Range Status   Specimen Description BLOOD RIGHT HAND  Final   Special Requests   Final    BOTTLES DRAWN AEROBIC AND ANAEROBIC Blood Culture adequate volume   Culture NO GROWTH 2 DAYS  Final   Report Status PENDING  Incomplete  Blood Culture (routine x 2)     Status: None (Preliminary result)   Collection Time: 07/17/16  3:31 PM  Result Value Ref Range Status   Specimen Description BLOOD LEFT HAND  Final   Special Requests   Final    BOTTLES DRAWN AEROBIC AND ANAEROBIC Blood Culture adequate volume   Culture NO GROWTH 2 DAYS  Final   Report Status PENDING  Incomplete  MRSA PCR Screening     Status: None   Collection Time: 07/17/16  6:38 PM  Result Value Ref Range Status   MRSA by PCR NEGATIVE NEGATIVE Final    Comment:        The GeneXpert MRSA Assay (FDA approved for NASAL specimens only), is one component of a comprehensive MRSA colonization surveillance program. It is not intended to diagnose MRSA infection nor to guide or monitor treatment for MRSA infections.   Respiratory Panel by PCR     Status: None   Collection Time: 07/18/16  7:09 PM  Result Value Ref Range Status   Adenovirus NOT DETECTED NOT DETECTED Final   Coronavirus 229E NOT DETECTED NOT DETECTED Final   Coronavirus HKU1 NOT DETECTED NOT  DETECTED Final   Coronavirus NL63 NOT DETECTED NOT DETECTED Final   Coronavirus OC43 NOT DETECTED NOT DETECTED Final   Metapneumovirus NOT DETECTED NOT DETECTED Final   Rhinovirus / Enterovirus NOT DETECTED NOT DETECTED Final   Influenza A NOT DETECTED NOT DETECTED Final   Influenza B NOT DETECTED NOT DETECTED Final   Parainfluenza Virus 1 NOT DETECTED NOT DETECTED Final   Parainfluenza Virus 2 NOT DETECTED NOT DETECTED Final   Parainfluenza Virus 3 NOT DETECTED NOT DETECTED Final   Parainfluenza Virus 4 NOT DETECTED NOT DETECTED Final   Respiratory Syncytial Virus NOT DETECTED NOT DETECTED Final   Bordetella pertussis NOT DETECTED NOT DETECTED Final   Chlamydophila pneumoniae NOT DETECTED NOT DETECTED Final  Mycoplasma pneumoniae NOT DETECTED NOT DETECTED Final    Comment: Performed at [facility], [place] [address], [place], [place] [address]  CULTURE, BLOOD (ROUTINE X 2) w Reflex to ID Panel     Status: None (Preliminary result)   Collection Time: 07/18/16  7:56 PM  Result Value Ref Range Status   Specimen Description BLOOD  R HAND  Final   Special Requests BOTTLES DRAWN AEROBIC AND ANAEROBIC  BCHV  Final   Culture NO GROWTH < 24 HOURS  Final   Report Status PENDING  Incomplete  CULTURE, BLOOD (ROUTINE X 2) w Reflex to ID Panel     Status: None (Preliminary result)   Collection Time: 07/18/16  8:08 PM  Result Value Ref Range Status   Specimen Description BLOOD  R AC  Final   Special Requests BOTTLES DRAWN AEROBIC AND ANAEROBIC BCHV  Final   Culture NO GROWTH < 24 HOURS  Final   Report Status PENDING  Incomplete    Coagulation Studies:  Recent Labs  07/17/16 1655 07/18/16 0533 07/19/16 0357  LABPROT 27.0* 26.0* 29.8*  INR 2.45 2.33 2.77    Urinalysis:  Recent Labs  07/17/16 1531  COLORURINE AMBER*  LABSPEC 1.030  PHURINE 8.0  GLUCOSEU 50*  HGBUR NEGATIVE  BILIRUBINUR NEGATIVE  KETONESUR NEGATIVE  PROTEINUR 100*  NITRITE NEGATIVE  LEUKOCYTESUR NEGATIVE       Imaging: Dg Chest 2 View  Result Date: 07/17/2016 CLINICAL DATA:  Fever. EXAM: CHEST  2 VIEW COMPARISON:  Radiographs of January 06, 2008. FINDINGS: Stable cardiomediastinal silhouette. Status post aortic valve repair. Stable mild central pulmonary vascular congestion is noted. No pneumothorax or pleural effusion is noted. No acute pulmonary disease is noted. Old right rib fracture is noted. IMPRESSION: Stable central pulmonary vascular congestion. No other significant abnormality seen. Electronically Signed   By: Marijo Conception, M.D.   On: 07/17/2016 16:25   Ct Chest W Contrast  Result Date: 07/19/2016 CLINICAL DATA:  60 y.o. male admitted with 2 weeks of fevers to 101.3. He has a history of end-stage renal disease on dialysis as well as aortic valve replacement. Chest x-ray showed no pneumonia. Pt with ESRD on HD, with a nonfunctioning transplant kidney recently stopped off immunosuppressives now with 2 weeks of fevers. Eval for transplant rejection, PNA or other source of fever EXAM: CT CHEST, ABDOMEN, AND PELVIS WITH CONTRAST TECHNIQUE: Multidetector CT imaging of the chest, abdomen and pelvis was performed following the standard protocol during bolus administration of intravenous contrast. CONTRAST:  160mL ISOVUE-300 IOPAMIDOL (ISOVUE-300) INJECTION 61% COMPARISON:  Chest radiograph 412 18. FINDINGS: CT CHEST FINDINGS Cardiovascular: Coronary artery calcification and aortic atherosclerotic calcification. Mediastinum/Nodes: No axillary supraclavicular adenopathy. No mediastinal hilar adenopathy. No pericardial fluid. Esophagus normal. Lungs/Pleura: No airspace disease. No pleural fluid. No suspicious nodularity. Airways are normal. Musculoskeletal: No aggressive osseous lesion. CT ABDOMEN AND PELVIS FINDINGS Hepatobiliary: No focal hepatic lesion. Several small gallstones in the gallbladder. No gallbladder inflammation. Pancreas: Pancreas is normal. No ductal dilatation. No pancreatic  inflammation. Spleen: Spleen measures 15.0 x 6.4 x 10.7 cm (volume = 540 cm^3). Mildly enlarged. Adrenals/urinary tract: Adrenal glands are normal. No native kidneys identified The transplant kidney in the RIGHT iliac fossa appears edematous. Kidney measures 12.6 cm in length. There is stranding within the fat adjacent to the kidney. No evidence of frank abscess or striated appearance. The transplant renal artery opacifies. Delayed imaging demonstrates no significant excretion of IV contrast. Bladder is collapsed Stomach/Bowel: Stomach, small-bowel and  cecum are normal. No evidence bowel dilatation or obstruction. Contrast travels the length of the small bowel and colon to the splenic flexure. Rectosigmoid colon normal. Vascular/Lymphatic: Abdominal aorta is normal caliber with atherosclerotic calcification. There is no retroperitoneal or periportal lymphadenopathy. No pelvic lymphadenopathy. Reproductive: Prostate normal Other: No significant free fluid.  No abscess. 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 No evidence of pulmonary infection. 2. Coronary artery calcification and aortic atherosclerotic calcification. Abdomen / Pelvis Impression: 1. 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 No evidence of frank renal abscess. 2. Renal arteries are opacified. No excretion of IV contrast identified. 3. No abscess in the abdomen pelvis. 4. Mild splenomegaly 5. Renal osteodystrophy. Electronically Signed   By: Suzy Bouchard M.D.   On: 07/19/2016 07:22   Ct Abdomen Pelvis W Contrast  Result Date: 07/19/2016 CLINICAL DATA:  61 y.o. male admitted with 2 weeks of fevers to 101.3. He has a history of end-stage renal disease on dialysis as well as aortic valve replacement. Chest x-ray showed no pneumonia. Pt with ESRD on HD, with a nonfunctioning transplant kidney recently stopped off immunosuppressives  now with 2 weeks of fevers. Eval for transplant rejection, PNA or other source of fever EXAM: CT CHEST, ABDOMEN, AND PELVIS WITH CONTRAST TECHNIQUE: Multidetector CT imaging of the chest, abdomen and pelvis was performed following the standard protocol during bolus administration of intravenous contrast. CONTRAST:  175mL ISOVUE-300 IOPAMIDOL (ISOVUE-300) INJECTION 61% COMPARISON:  Chest radiograph 412 18. FINDINGS: CT CHEST FINDINGS Cardiovascular: Coronary artery calcification and aortic atherosclerotic calcification. Mediastinum/Nodes: No axillary supraclavicular adenopathy. No mediastinal hilar adenopathy. No pericardial fluid. Esophagus normal. Lungs/Pleura: No airspace disease. No pleural fluid. No suspicious nodularity. Airways are normal. Musculoskeletal: No aggressive osseous lesion. CT ABDOMEN AND PELVIS FINDINGS Hepatobiliary: No focal hepatic lesion. Several small gallstones in the gallbladder. No gallbladder inflammation. Pancreas: Pancreas is normal. No ductal dilatation. No pancreatic inflammation. Spleen: Spleen measures 15.0 x 6.4 x 10.7 cm (volume = 540 cm^3). Mildly enlarged. Adrenals/urinary tract: Adrenal glands are normal. No native kidneys identified The transplant kidney in the RIGHT iliac fossa appears edematous. Kidney measures 12.6 cm in length. There is stranding within the fat adjacent to the kidney. No evidence of frank abscess or striated appearance. The transplant renal artery opacifies. Delayed imaging demonstrates no significant excretion of IV contrast. Bladder is collapsed Stomach/Bowel: Stomach, small-bowel and cecum are normal. No evidence bowel dilatation or obstruction. Contrast travels the length of the small bowel and colon to the splenic flexure. Rectosigmoid colon normal. Vascular/Lymphatic: Abdominal aorta is normal caliber with atherosclerotic calcification. There is no retroperitoneal or periportal lymphadenopathy. No pelvic lymphadenopathy. Reproductive: Prostate normal  Other: No significant free fluid.  No abscess. 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 No evidence of pulmonary infection. 2. Coronary artery calcification and aortic atherosclerotic calcification. Abdomen / Pelvis Impression: 1. 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 No evidence of frank renal abscess. 2. Renal arteries are opacified. No excretion of IV contrast identified. 3. No abscess in the abdomen pelvis. 4. Mild splenomegaly 5. Renal osteodystrophy. Electronically Signed   By: Suzy Bouchard M.D.   On: 07/19/2016 07:22      Assessment & Plan: Jeremy [name] is a 61 y.o. 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 End Stage Renal Disease on hemodialysis: TTS. Status post renal transplant x2. Secondary to IgA nephropathy - Seen and examined on hemodialysis. Tolerating treatment well. Continue TTS schedule.  - Restart prednisone  2. Anemia with chronic kidney disease: hemoglobin 9.7. [place] as outpatient.   3. Secondary Hyperparathyroidism:  - sevelamer for binding.   4. Hypertension: blood pressure at goal - amlodipine, carvedilol  5. Fever of unknown origin: appreciate ID input. - restart prednisone as above. Concern for renal transplant rejection or drug induced fever.       LOS: 0 Tanay Massiah 4/14/201811:53 AM

## 2016-07-19 NOTE — Progress Notes (Signed)
[facility] NAME: Nameer Summer    MR#:  [id number]  DATE OF BIRTH:  September 08, 1955  SUBJECTIVE:  CHIEF COMPLAINT:   Chief Complaint  Patient presents with  . Fever  . Cough   -Admitted with fevers of unknown origin for almost 2 weeks now. MAXIMUM TEMPERATURE of 103.18F last night. -Low-grade temperature today. Denies any recent travel. Seen at dialysis today denies any complaints REVIEW OF SYSTEMS:  Review of Systems  Constitutional: Positive for fever and malaise/fatigue. Negative for chills.  HENT: Negative for ear discharge, hearing loss and nosebleeds.   Eyes: Negative for blurred vision and double vision.  Respiratory: Negative for cough, shortness of breath and wheezing.   Cardiovascular: Negative for chest pain, palpitations and leg swelling.  Gastrointestinal: Negative for abdominal pain, constipation, diarrhea, nausea and vomiting.  Genitourinary: Negative for dysuria.  Musculoskeletal: Negative for myalgias.  Neurological: Negative for dizziness, speech change, focal weakness, seizures and headaches.  Psychiatric/Behavioral: Negative for depression.    DRUG ALLERGIES:   Allergies  Allergen Reactions  . Atorvastatin Other (See Comments)    Muscle Aches  . Sertraline Rash    VITALS:  Blood pressure (!) 184/68, pulse 85, temperature 98.9 F (37.2 C), temperature source Oral, resp. rate 18, height 5\' 10"  (1.778 m), weight 85.2 kg (187 lb 13.3 oz), SpO2 98 %.  PHYSICAL EXAMINATION:  Physical Exam  GENERAL:  61 y.o.-year-old patient lying in the bed with no acute distress.  EYES: Pupils equal, round, reactive to light and accommodation. No scleral icterus. Extraocular muscles intact.  HEENT: Head atraumatic, normocephalic. Oropharynx and nasopharynx clear.  NECK:  Supple, no jugular venous distention. No thyroid enlargement, no tenderness.  LUNGS: Normal breath sounds bilaterally, no wheezing, rales,rhonchi or  crepitation. No use of accessory muscles of respiration. Decreased bibasilar breath sounds noted.  CARDIOVASCULAR: S1, S2 normal. No  rubs, or gallops. 3/6 systolic murmur present ABDOMEN: Soft, nontender, nondistended. Bowel sounds present. No organomegaly or mass.  EXTREMITIES: No pedal edema, cyanosis, or clubbing.  Left forearm AV fistula with good thrill. No erythema or tenderness noted NEUROLOGIC: Cranial nerves II through XII are intact. Muscle strength 5/5 in all extremities. Sensation intact. Gait not checked.  PSYCHIATRIC: The patient is alert and oriented x 3.  SKIN: No obvious rash, lesion, or ulcer. No rash noted.   LABORATORY PANEL:   CBC  Recent Labs Lab 07/19/16 0357  WBC 6.5  HGB 9.7*  HCT 29.0*  PLT 158   ------------------------------------------------------------------------------------------------------------------  Chemistries   Recent Labs Lab 07/17/16 1527 07/19/16 0357  NA 136 136  K 4.0 4.6  CL 96* 95*  CO2 29 29  GLUCOSE 127* 103*  BUN 21* 57*  CREATININE 5.37* 9.60*  CALCIUM 8.9 8.8*  AST 25  --   ALT 13*  --   ALKPHOS 191*  --   BILITOT 0.8  --    ------------------------------------------------------------------------------------------------------------------  Cardiac Enzymes  Recent Labs Lab 07/17/16 1527  TROPONINI 0.06*   ------------------------------------------------------------------------------------------------------------------  RADIOLOGY:  Dg Chest 2 View  Result Date: 07/17/2016 CLINICAL DATA:  Fever. EXAM: CHEST  2 VIEW COMPARISON:  Radiographs of January 06, 2008. FINDINGS: Stable cardiomediastinal silhouette. Status post aortic valve repair. Stable mild central pulmonary vascular congestion is noted. No pneumothorax or pleural effusion is noted. No acute pulmonary disease is noted. Old right rib fracture is noted. IMPRESSION: Stable central pulmonary vascular congestion. No other significant abnormality seen.  Electronically Signed   By: Jeneen Rinks  Murlean Caller, M.D.   On: 07/17/2016 16:25   Ct Chest W Contrast  Result Date: 07/19/2016 CLINICAL DATA:  61 y.o. male admitted with 2 weeks of fevers to 101.3. He has a history of end-stage renal disease on dialysis as well as aortic valve replacement. Chest x-ray showed no pneumonia. Pt with ESRD on HD, with a nonfunctioning transplant kidney recently stopped off immunosuppressives now with 2 weeks of fevers. Eval for transplant rejection, PNA or other source of fever EXAM: CT CHEST, ABDOMEN, AND PELVIS WITH CONTRAST TECHNIQUE: Multidetector CT imaging of the chest, abdomen and pelvis was performed following the standard protocol during bolus administration of intravenous contrast. CONTRAST:  193mL ISOVUE-300 IOPAMIDOL (ISOVUE-300) INJECTION 61% COMPARISON:  Chest radiograph 412 18. FINDINGS: CT CHEST FINDINGS Cardiovascular: Coronary artery calcification and aortic atherosclerotic calcification. Mediastinum/Nodes: No axillary supraclavicular adenopathy. No mediastinal hilar adenopathy. No pericardial fluid. Esophagus normal. Lungs/Pleura: No airspace disease. No pleural fluid. No suspicious nodularity. Airways are normal. Musculoskeletal: No aggressive osseous lesion. CT ABDOMEN AND PELVIS FINDINGS Hepatobiliary: No focal hepatic lesion. Several small gallstones in the gallbladder. No gallbladder inflammation. Pancreas: Pancreas is normal. No ductal dilatation. No pancreatic inflammation. Spleen: Spleen measures 15.0 x 6.4 x 10.7 cm (volume = 540 cm^3). Mildly enlarged. Adrenals/urinary tract: Adrenal glands are normal. No native kidneys identified The transplant kidney in the RIGHT iliac fossa appears edematous. Kidney measures 12.6 cm in length. There is stranding within the fat adjacent to the kidney. No evidence of frank abscess or striated appearance. The transplant renal artery opacifies. Delayed imaging demonstrates no significant excretion of IV contrast. Bladder is  collapsed Stomach/Bowel: Stomach, small-bowel and cecum are normal. No evidence bowel dilatation or obstruction. Contrast travels the length of the small bowel and colon to the splenic flexure. Rectosigmoid colon normal. Vascular/Lymphatic: Abdominal aorta is normal caliber with atherosclerotic calcification. There is no retroperitoneal or periportal lymphadenopathy. No pelvic lymphadenopathy. Reproductive: Prostate normal Other: No significant free fluid.  No abscess. 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 No evidence of pulmonary infection. 2. Coronary artery calcification and aortic atherosclerotic calcification. Abdomen / Pelvis Impression: 1. 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 No evidence of frank renal abscess. 2. Renal arteries are opacified. No excretion of IV contrast identified. 3. No abscess in the abdomen pelvis. 4. Mild splenomegaly 5. Renal osteodystrophy. Electronically Signed   By: Suzy Bouchard M.D.   On: 07/19/2016 07:22   Ct Abdomen Pelvis W Contrast  Result Date: 07/19/2016 CLINICAL DATA:  61 y.o. male admitted with 2 weeks of fevers to 101.3. He has a history of end-stage renal disease on dialysis as well as aortic valve replacement. Chest x-ray showed no pneumonia. Pt with ESRD on HD, with a nonfunctioning transplant kidney recently stopped off immunosuppressives now with 2 weeks of fevers. Eval for transplant rejection, PNA or other source of fever EXAM: CT CHEST, ABDOMEN, AND PELVIS WITH CONTRAST TECHNIQUE: Multidetector CT imaging of the chest, abdomen and pelvis was performed following the standard protocol during bolus administration of intravenous contrast. CONTRAST:  167mL ISOVUE-300 IOPAMIDOL (ISOVUE-300) INJECTION 61% COMPARISON:  Chest radiograph 412 18. FINDINGS: CT CHEST FINDINGS Cardiovascular: Coronary artery calcification and aortic atherosclerotic  calcification. Mediastinum/Nodes: No axillary supraclavicular adenopathy. No mediastinal hilar adenopathy. No pericardial fluid. Esophagus normal. Lungs/Pleura: No airspace disease. No pleural fluid. No suspicious nodularity. Airways are normal. Musculoskeletal: No aggressive osseous lesion. CT ABDOMEN AND PELVIS FINDINGS Hepatobiliary: No focal hepatic lesion. Several  small gallstones in the gallbladder. No gallbladder inflammation. Pancreas: Pancreas is normal. No ductal dilatation. No pancreatic inflammation. Spleen: Spleen measures 15.0 x 6.4 x 10.7 cm (volume = 540 cm^3). Mildly enlarged. Adrenals/urinary tract: Adrenal glands are normal. No native kidneys identified The transplant kidney in the RIGHT iliac fossa appears edematous. Kidney measures 12.6 cm in length. There is stranding within the fat adjacent to the kidney. No evidence of frank abscess or striated appearance. The transplant renal artery opacifies. Delayed imaging demonstrates no significant excretion of IV contrast. Bladder is collapsed Stomach/Bowel: Stomach, small-bowel and cecum are normal. No evidence bowel dilatation or obstruction. Contrast travels the length of the small bowel and colon to the splenic flexure. Rectosigmoid colon normal. Vascular/Lymphatic: Abdominal aorta is normal caliber with atherosclerotic calcification. There is no retroperitoneal or periportal lymphadenopathy. No pelvic lymphadenopathy. Reproductive: Prostate normal Other: No significant free fluid.  No abscess. 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 No evidence of pulmonary infection. 2. Coronary artery calcification and aortic atherosclerotic calcification. Abdomen / Pelvis Impression: 1. 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 No evidence of frank renal abscess. 2. Renal arteries are opacified. No excretion of IV contrast identified. 3. No  abscess in the abdomen pelvis. 4. Mild splenomegaly 5. Renal osteodystrophy. Electronically Signed   By: Suzy Bouchard M.D.   On: 07/19/2016 07:22    EKG:   Orders placed or performed during the hospital encounter of 07/17/16  . ED EKG 12-Lead  . 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  #1 fevers of unknown origin.-Urine analysis with no infection, chest x-ray with no evidence of any infection. -Blood cultures are pending at this time. MAXIMUM TEMPERATURE of 103.78F last night. -Restarted vancomycin and Zosyn for now. -Chest thoracic echocardiogram pending to rule out vegetations on his artificial aortic valve. -Infectious disease consult requested.---Input appreciated -Denies any back pain at this time. 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 Currently restarted back on dialysis for more than one year. -Nephrology consulted for the same. -Has left forearm AV fistula. On Tuesday Thursday, Saturday dialysis schedule  #3 anemia of chronic disease-hemoglobin is at 9.7. Monitor closely. Continue Epogen with dialysis.  #4 history of aortic valve replacement-with mechanical valve. Continue Coumadin. INR is at 2.3. Pharmacy to adjust the dose.  #5 CAD-stable. Patient on Coreg, statin. History of stent in the past.  #6 DVT prophylaxis-continue Coumadin   Patient is very active and ambulatory at baseline.    All the records are reviewed and case  discussed with Care Management/Social Workerr. Management plans discussed with the patient, family and they are in agreement.  CODE STATUS: Full Code  TOTAL TIME TAKING CARE OF THIS PATIENT: 27 minutes.   POSSIBLE D/C IN 2 DAYS, DEPENDING ON CLINICAL CONDITION.   Katonya Blecher M.D on 07/19/2016 at 1:43 PM  Between 7am to 6pm - Pager - [phone]  After 6pm go to [email or web address] - [facility]  Office  [phone]  CC: Primary care physician; Kenn File, MD

## 2016-07-19 NOTE — Progress Notes (Signed)
PRE DIALYSIS ASSESSMENT 

## 2016-07-19 NOTE — Progress Notes (Signed)
HD STARTED  

## 2016-07-19 NOTE — Plan of Care (Signed)
Problem: Fluid Volume: Goal: Compliance with measures to maintain balanced fluid volume will improve Outcome: Progressing Re-enforced teaching on maintaining staying below his fluid restriction

## 2016-07-19 NOTE — Progress Notes (Signed)
POST DIALYSIS ASSESSMENT 

## 2016-07-19 NOTE — Progress Notes (Addendum)
[facility] for Warfarin/Vancomycin/Zosyn Indication: atrial fibrillation/fever  Allergies  Allergen Reactions  . Atorvastatin Other (See Comments)    Muscle Aches  . Sertraline Rash    Patient Measurements: Height: 5\' 10"  (177.8 cm) Weight: 186 lb (84.4 kg) IBW/kg (Calculated) : 73   Vital Signs: Temp: 98.5 F (36.9 C) (04/14 0447) Temp Source: Oral (04/14 0447) BP: 154/47 (04/14 0447) Pulse Rate: 66 (04/14 0447)  Labs:  Recent Labs  07/17/16 1527 07/17/16 1655 07/18/16 0533 07/19/16 0357  HGB 9.7*  --   --  9.7*  HCT 29.0*  --   --  29.0*  PLT 185  --   --  158  LABPROT  --  27.0* 26.0* 29.8*  INR  --  2.45 2.33 2.77  CREATININE 5.37*  --   --  9.60*  CKTOTAL 51  --   --   --   TROPONINI 0.06*  --   --   --     Medical History: Past Medical History:  Diagnosis Date  . Anemia   . Arthritis   . Blood transfusion without reported diagnosis   . Cancer ([place])   . Chronic anticoagulation 06/2012  . Dialysis patient [facility]) 01 01 2008  . Hx of colonoscopy    2009 by Dr. Laural Golden. Normal except for small submucosal lipoma. No evidence of polyps or colitis.  . Hyperlipidemia   . Hypertension   . Kidney disease    Chronic kidney disease secondary to IgA nephropathy diagnosed when he was 67. 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 Patient received doxycycline x 1 and is ordered vancomycin and Zosyn.  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   4/12  INR 2.45    Warfarin 2 mg 4/13  INR 2.33   Warfarin 2 mg 4/14  INR 2.77  Goal of Therapy:  INR 2-3   Vancomycin pre-HD level 15-25 mcg/ml  Resolution of Infection   Plan:  1. Will slightly decrease Warfarin to 1.5 mg x 1 for now due to jump in INR and follow INR closely due to potential for interaction with antibiotics.   2.ABX:  Zosyn 3.375 g EI q 12 hours.  Vancomycin 1000 mg iv once given 4/12. Will give an additional 1000 mg today for a total loading dose of 2000 mg. Will order vancomycin 1000 mg iv q HD and plan on checking a pre-HD level prior to the 3rd dialysis session with a goal of 15-25 mcg/ml.      Recent Labs Lab 07/17/16 1527 07/19/16 0357  WBC 7.1 6.5  CREATININE 5.37* 9.60*  LATICACIDVEN 1.5  --     Estimated Creatinine Clearance: 8.6 mL/min (A) (by C-G formula based on SCr of 9.6 mg/dL (H)).    Allergies  Allergen Reactions  . Atorvastatin Other (See Comments)    Muscle Aches  . Sertraline Rash    Antimicrobials this admission: doxycycline 4/12 x 1 4/12 vancomycin >>  4/12 Zosyn >>  Dose adjustments this admission:   Microbiology results: 4/12 BCx: NGTD 4/12 MRSA PCR: negative  Thank you for allowing pharmacy to be a part of this patient's care.  Ladena Jacquez A 07/19/2016 9:52 AM

## 2016-07-20 LAB — PROTIME-INR
INR: 2.67
Prothrombin Time: 29 seconds — ABNORMAL HIGH (ref 11.4–15.2)

## 2016-07-20 MED ORDER — WARFARIN SODIUM 1 MG PO TABS
1.5000 mg | ORAL_TABLET | Freq: Every day | ORAL | Status: DC
  Administered 2016-07-20 – 2016-07-21 (×2): 1.5 mg via ORAL
  Filled 2016-07-20: qty 1
  Filled 2016-07-20: qty 2

## 2016-07-20 NOTE — Progress Notes (Signed)
[facility]  ROUNDING NOTE   Subjective:   Tmax 101.3 - continues to have fevers, chill and cough  Hemodialysis yesterday. Tolerated treatment well. UF of 1 litre  Objective:  Vital signs in last 24 hours:  Temp:  [98.8 F (37.1 C)-101.3 F (38.5 C)] 99.7 F (37.6 C) (04/15 0914) Pulse Rate:  [72-99] 77 (04/15 0914) Resp:  [18-22] 18 (04/15 0435) BP: (141-184)/(55-76) 163/55 (04/15 0914) SpO2:  [94 %-98 %] 94 % (04/15 0435) Weight:  [84.4 kg (186 lb 1.1 oz)] 84.4 kg (186 lb 1.1 oz) (04/15 0341)  Weight change:  Filed Weights   07/17/16 1457 07/19/16 1000 07/20/16 0341  Weight: 84.4 kg (186 lb) 85.2 kg (187 lb 13.3 oz) 84.4 kg (186 lb 1.1 oz)    Intake/Output: I/O last 3 completed shifts: In: 240 [P.O.:240] Out: 1000 [Other:1000]   Intake/Output this shift:  No intake/output data recorded.  Physical Exam: General: NAD,   Head: Normocephalic, atraumatic. Moist oral mucosal membranes  Eyes: Anicteric, PERRL  Neck: Supple, trachea midline  Lungs:  Clear to auscultation  Heart: Regular rate and rhythm  Abdomen:  Soft, nontender, RLQ allograft kidney  Extremities: no peripheral edema.  Neurologic: Nonfocal, moving all four extremities  Skin: No lesions  Access: Left forearm AVF    Basic Metabolic Panel:  Recent Labs Lab 07/17/16 1527 07/19/16 0357  NA 136 136  K 4.0 4.6  CL 96* 95*  CO2 29 29  GLUCOSE 127* 103*  BUN 21* 57*  CREATININE 5.37* 9.60*  CALCIUM 8.9 8.8*    Liver Function Tests:  Recent Labs Lab 07/17/16 1527  AST 25  ALT 13*  ALKPHOS 191*  BILITOT 0.8  PROT 7.9  ALBUMIN 3.0*   No results for input(s): LIPASE, AMYLASE in the last 168 hours. No results for input(s): AMMONIA in the last 168 hours.  CBC:  Recent Labs Lab 07/17/16 1527 07/19/16 0357  WBC 7.1 6.5  NEUTROABS 3.7  --   HGB 9.7* 9.7*  HCT 29.0* 29.0*  MCV 102.1* 102.6*  PLT 185 158    Cardiac Enzymes:  Recent Labs Lab 07/17/16 1527  CKTOTAL  51  TROPONINI 0.06*    BNP: Invalid input(s): POCBNP  CBG: No results for input(s): GLUCAP in the last 168 hours.  Microbiology: Results for orders placed or performed during the hospital encounter of 07/17/16  Blood Culture (routine x 2)     Status: None (Preliminary result)   Collection Time: 07/17/16  3:27 PM  Result Value Ref Range Status   Specimen Description BLOOD RIGHT HAND  Final   Special Requests   Final    BOTTLES DRAWN AEROBIC AND ANAEROBIC Blood Culture adequate volume   Culture NO GROWTH 3 DAYS  Final   Report Status PENDING  Incomplete  Blood Culture (routine x 2)     Status: None (Preliminary result)   Collection Time: 07/17/16  3:31 PM  Result Value Ref Range Status   Specimen Description BLOOD LEFT HAND  Final   Special Requests   Final    BOTTLES DRAWN AEROBIC AND ANAEROBIC Blood Culture adequate volume   Culture NO GROWTH 3 DAYS  Final   Report Status PENDING  Incomplete  MRSA PCR Screening     Status: None   Collection Time: 07/17/16  6:38 PM  Result Value Ref Range Status   MRSA by PCR NEGATIVE NEGATIVE Final    Comment:        The GeneXpert MRSA Assay (FDA approved for  NASAL specimens only), is one component of a comprehensive MRSA colonization surveillance program. It is not intended to diagnose MRSA infection nor to guide or monitor treatment for MRSA infections.   Respiratory Panel by PCR     Status: None   Collection Time: 07/18/16  7:09 PM  Result Value Ref Range Status   Adenovirus NOT DETECTED NOT DETECTED Final   Coronavirus 229E NOT DETECTED NOT DETECTED Final   Coronavirus HKU1 NOT DETECTED NOT DETECTED Final   Coronavirus NL63 NOT DETECTED NOT DETECTED Final   Coronavirus OC43 NOT DETECTED NOT DETECTED Final   Metapneumovirus NOT DETECTED NOT DETECTED Final   Rhinovirus / Enterovirus NOT DETECTED NOT DETECTED Final   Influenza A NOT DETECTED NOT DETECTED Final   Influenza B NOT DETECTED NOT DETECTED Final   Parainfluenza Virus  1 NOT DETECTED NOT DETECTED Final   Parainfluenza Virus 2 NOT DETECTED NOT DETECTED Final   Parainfluenza Virus 3 NOT DETECTED NOT DETECTED Final   Parainfluenza Virus 4 NOT DETECTED NOT DETECTED Final   Respiratory Syncytial Virus NOT DETECTED NOT DETECTED Final   Bordetella pertussis NOT DETECTED NOT DETECTED Final   Chlamydophila pneumoniae NOT DETECTED NOT DETECTED Final   Mycoplasma pneumoniae NOT DETECTED NOT DETECTED Final    Comment: Performed at [facility], [place] [address], [place], [name] [address]  CULTURE, BLOOD (ROUTINE X 2) w Reflex to ID Panel     Status: None (Preliminary result)   Collection Time: 07/18/16  7:56 PM  Result Value Ref Range Status   Specimen Description BLOOD  R HAND  Final   Special Requests BOTTLES DRAWN AEROBIC AND ANAEROBIC  BCHV  Final   Culture NO GROWTH 2 DAYS  Final   Report Status PENDING  Incomplete  CULTURE, BLOOD (ROUTINE X 2) w Reflex to ID Panel     Status: None (Preliminary result)   Collection Time: 07/18/16  8:08 PM  Result Value Ref Range Status   Specimen Description BLOOD  R AC  Final   Special Requests BOTTLES DRAWN AEROBIC AND ANAEROBIC [employer]  Final   Culture NO GROWTH 2 DAYS  Final   Report Status PENDING  Incomplete    Coagulation Studies:  Recent Labs  07/17/16 1655 07/18/16 0533 07/19/16 0357 07/20/16 0422  LABPROT 27.0* 26.0* 29.8* 29.0*  INR 2.45 2.33 2.77 2.67    Urinalysis:  Recent Labs  07/17/16 1531  COLORURINE AMBER*  LABSPEC 1.030  PHURINE 8.0  GLUCOSEU 50*  HGBUR NEGATIVE  BILIRUBINUR NEGATIVE  KETONESUR NEGATIVE  PROTEINUR 100*  NITRITE NEGATIVE  LEUKOCYTESUR NEGATIVE      Imaging: Ct Chest W Contrast  Result Date: 07/19/2016 CLINICAL DATA:  61 y.o. male admitted with 2 weeks of fevers to 101.3. He has a history of end-stage renal disease on dialysis as well as aortic valve replacement. Chest x-ray showed no pneumonia. Pt with ESRD on HD, with a nonfunctioning transplant kidney  recently stopped off immunosuppressives now with 2 weeks of fevers. Eval for transplant rejection, PNA or other source of fever EXAM: CT CHEST, ABDOMEN, AND PELVIS WITH CONTRAST TECHNIQUE: Multidetector CT imaging of the chest, abdomen and pelvis was performed following the standard protocol during bolus administration of intravenous contrast. CONTRAST:  1105mL ISOVUE-300 IOPAMIDOL (ISOVUE-300) INJECTION 61% COMPARISON:  Chest radiograph 412 18. FINDINGS: CT CHEST FINDINGS Cardiovascular: Coronary artery calcification and aortic atherosclerotic calcification. Mediastinum/Nodes: No axillary supraclavicular adenopathy. No mediastinal hilar adenopathy. No pericardial fluid. Esophagus normal. Lungs/Pleura: No airspace disease. No pleural fluid.  No suspicious nodularity. Airways are normal. Musculoskeletal: No aggressive osseous lesion. CT ABDOMEN AND PELVIS FINDINGS Hepatobiliary: No focal hepatic lesion. Several small gallstones in the gallbladder. No gallbladder inflammation. Pancreas: Pancreas is normal. No ductal dilatation. No pancreatic inflammation. Spleen: Spleen measures 15.0 x 6.4 x 10.7 cm (volume = 540 cm^3). Mildly enlarged. Adrenals/urinary tract: Adrenal glands are normal. No native kidneys identified The transplant kidney in the RIGHT iliac fossa appears edematous. Kidney measures 12.6 cm in length. There is stranding within the fat adjacent to the kidney. No evidence of frank abscess or striated appearance. The transplant renal artery opacifies. Delayed imaging demonstrates no significant excretion of IV contrast. Bladder is collapsed Stomach/Bowel: Stomach, small-bowel and cecum are normal. No evidence bowel dilatation or obstruction. Contrast travels the length of the small bowel and colon to the splenic flexure. Rectosigmoid colon normal. Vascular/Lymphatic: Abdominal aorta is normal caliber with atherosclerotic calcification. There is no retroperitoneal or periportal lymphadenopathy. No pelvic  lymphadenopathy. Reproductive: Prostate normal Other: No significant free fluid.  No abscess. 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 No evidence of pulmonary infection. 2. Coronary artery calcification and aortic atherosclerotic calcification. Abdomen / Pelvis Impression: 1. 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 No evidence of frank renal abscess. 2. Renal arteries are opacified. No excretion of IV contrast identified. 3. No abscess in the abdomen pelvis. 4. Mild splenomegaly 5. Renal osteodystrophy. Electronically Signed   By: Suzy Bouchard M.D.   On: 07/19/2016 07:22   Ct Abdomen Pelvis W Contrast  Result Date: 07/19/2016 CLINICAL DATA:  61 y.o. male admitted with 2 weeks of fevers to 101.3. He has a history of end-stage renal disease on dialysis as well as aortic valve replacement. Chest x-ray showed no pneumonia. Pt with ESRD on HD, with a nonfunctioning transplant kidney recently stopped off immunosuppressives now with 2 weeks of fevers. Eval for transplant rejection, PNA or other source of fever EXAM: CT CHEST, ABDOMEN, AND PELVIS WITH CONTRAST TECHNIQUE: Multidetector CT imaging of the chest, abdomen and pelvis was performed following the standard protocol during bolus administration of intravenous contrast. CONTRAST:  140mL ISOVUE-300 IOPAMIDOL (ISOVUE-300) INJECTION 61% COMPARISON:  Chest radiograph 412 18. FINDINGS: CT CHEST FINDINGS Cardiovascular: Coronary artery calcification and aortic atherosclerotic calcification. Mediastinum/Nodes: No axillary supraclavicular adenopathy. No mediastinal hilar adenopathy. No pericardial fluid. Esophagus normal. Lungs/Pleura: No airspace disease. No pleural fluid. No suspicious nodularity. Airways are normal. Musculoskeletal: No aggressive osseous lesion. CT ABDOMEN AND PELVIS FINDINGS Hepatobiliary: No focal hepatic lesion. Several small  gallstones in the gallbladder. No gallbladder inflammation. Pancreas: Pancreas is normal. No ductal dilatation. No pancreatic inflammation. Spleen: Spleen measures 15.0 x 6.4 x 10.7 cm (volume = 540 cm^3). Mildly enlarged. Adrenals/urinary tract: Adrenal glands are normal. No native kidneys identified The transplant kidney in the RIGHT iliac fossa appears edematous. Kidney measures 12.6 cm in length. There is stranding within the fat adjacent to the kidney. No evidence of frank abscess or striated appearance. The transplant renal artery opacifies. Delayed imaging demonstrates no significant excretion of IV contrast. Bladder is collapsed Stomach/Bowel: Stomach, small-bowel and cecum are normal. No evidence bowel dilatation or obstruction. Contrast travels the length of the small bowel and colon to the splenic flexure. Rectosigmoid colon normal. Vascular/Lymphatic: Abdominal aorta is normal caliber with atherosclerotic calcification. There is no retroperitoneal or periportal lymphadenopathy. No pelvic lymphadenopathy. Reproductive: Prostate normal Other: No significant free fluid.  No abscess. Musculoskeletal: Diffuse sclerosis of the bones  consistent renal osteodystrophy IMPRESSION: Chest Impression: 1. No evidence of pulmonary infection. 2. Coronary artery calcification and aortic atherosclerotic calcification. Abdomen / Pelvis Impression: 1. 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 No evidence of frank renal abscess. 2. Renal arteries are opacified. No excretion of IV contrast identified. 3. No abscess in the abdomen pelvis. 4. Mild splenomegaly 5. Renal osteodystrophy. Electronically Signed   By: Suzy Bouchard M.D.   On: 07/19/2016 07:22     Medications:    . allopurinol  200 mg Oral Daily  . amLODipine  5 mg Oral QPM  . benzonatate  100 mg Oral TID  . carvedilol  12.5 mg Oral BID  . dicyclomine  20 mg Oral BID  . magnesium oxide  400 mg Oral BID  .  pantoprazole  40 mg Oral Daily  . piperacillin-tazobactam (ZOSYN)  IV  3.375 g Intravenous Q12H  . pravastatin  40 mg Oral Daily  . predniSONE  5 mg Oral Q breakfast  . sevelamer carbonate  0.8 g Oral TID PC  . sodium chloride flush  3 mL Intravenous Q12H  . vancomycin  1,000 mg Intravenous Q T,Th,Sa-HD  . warfarin  1.5 mg Oral q1800  . Warfarin - Pharmacist Dosing Inpatient   Does not apply q1800   sodium chloride, acetaminophen, hydrALAZINE, ibuprofen, nitroGLYCERIN, sodium chloride flush  Assessment/ Plan:  [name] is a 61 y.o. 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 End Stage Renal Disease on hemodialysis: TTS. Status post renal transplant x2. Secondary to IgA nephropathy - Continue TTS schedule.  - Restarted prednisone  2. Anemia with chronic kidney disease: hemoglobin 9.7. [place] as outpatient.   3. Secondary Hyperparathyroidism:  - sevelamer for binding.   4. Hypertension: blood pressure at goal - amlodipine, carvedilol  5. Fever of unknown origin: appreciate ID input. - restarted prednisone.  - Zosyn and vancomycin    LOS: [place], [place] 4/15/201811:41 AM

## 2016-07-20 NOTE — Plan of Care (Signed)
Problem: Physical Regulation: Goal: Ability to maintain clinical measurements within normal limits will improve Outcome: Progressing Educated patient on the need to maintain daily fluid restriction

## 2016-07-20 NOTE — Progress Notes (Signed)
[place] [facility] [place] NAME: Jeremy [name]    MR#:  [id number]  DATE OF BIRTH:  1955-11-17  SUBJECTIVE:  Feels okay. Very low-grade fever of 99. Eating well. REVIEW OF SYSTEMS:  Review of Systems  Constitutional: Positive for fever and malaise/fatigue. Negative for chills.  HENT: Negative for ear discharge, hearing loss and nosebleeds.   Eyes: Negative for blurred vision and double vision.  Respiratory: Negative for cough, shortness of breath and wheezing.   Cardiovascular: Negative for chest pain, palpitations and leg swelling.  Gastrointestinal: Negative for abdominal pain, constipation, diarrhea, nausea and vomiting.  Genitourinary: Negative for dysuria.  Musculoskeletal: Negative for myalgias.  Neurological: Negative for dizziness, speech change, focal weakness, seizures and headaches.  Psychiatric/Behavioral: Negative for depression.    DRUG ALLERGIES:   Allergies  Allergen Reactions  . Atorvastatin Other (See Comments)    Muscle Aches  . Sertraline Rash    VITALS:  Blood pressure (!) 163/55, pulse 77, temperature 99.7 F (37.6 C), temperature source Oral, resp. rate 18, height 5\' 10"  (1.778 m), weight 84.4 kg (186 lb 1.1 oz), SpO2 94 %.  PHYSICAL EXAMINATION:  Physical Exam  GENERAL:  61 y.o.-year-old patient lying in the bed with no acute distress.  EYES: Pupils equal, round, reactive to light and accommodation. No scleral icterus. Extraocular muscles intact.  HEENT: Head atraumatic, normocephalic. Oropharynx and nasopharynx clear.  NECK:  Supple, no jugular venous distention. No thyroid enlargement, no tenderness.  LUNGS: Normal breath sounds bilaterally, no wheezing, rales,rhonchi or crepitation. No use of accessory muscles of respiration. Decreased bibasilar breath sounds noted.  CARDIOVASCULAR: S1, S2 normal. No  rubs, or gallops. 3/6 systolic murmur present ABDOMEN: Soft, nontender, nondistended. Bowel sounds present. No  organomegaly or mass.  EXTREMITIES: No pedal edema, cyanosis, or clubbing.  Left forearm AV fistula with good thrill. No erythema or tenderness noted NEUROLOGIC: Cranial nerves II through XII are intact. Muscle strength 5/5 in all extremities. Sensation intact. Gait not checked.  PSYCHIATRIC:  patient is alert and oriented x 3.  SKIN: No obvious rash, lesion, or ulcer. No rash noted.   LABORATORY PANEL:   CBC  Recent Labs Lab 07/19/16 0357  WBC 6.5  HGB 9.7*  HCT 29.0*  PLT 158   ------------------------------------------------------------------------------------------------------------------  Chemistries   Recent Labs Lab 07/17/16 1527 07/19/16 0357  NA 136 136  K 4.0 4.6  CL 96* 95*  CO2 29 29  GLUCOSE 127* 103*  BUN 21* 57*  CREATININE 5.37* 9.60*  CALCIUM 8.9 8.8*  AST 25  --   ALT 13*  --   ALKPHOS 191*  --   BILITOT 0.8  --    ------------------------------------------------------------------------------------------------------------------  Cardiac Enzymes  Recent Labs Lab 07/17/16 1527  TROPONINI 0.06*   ------------------------------------------------------------------------------------------------------------------  RADIOLOGY:  Ct Chest W Contrast  Result Date: 07/19/2016 CLINICAL DATA:  61 y.o. male admitted with 2 weeks of fevers to 101.3. He has a history of end-stage renal disease on dialysis as well as aortic valve replacement. Chest x-ray showed no pneumonia. Pt with ESRD on HD, with a nonfunctioning transplant kidney recently stopped off immunosuppressives now with 2 weeks of fevers. Eval for transplant rejection, PNA or other source of fever EXAM: CT CHEST, ABDOMEN, AND PELVIS WITH CONTRAST TECHNIQUE: Multidetector CT imaging of the chest, abdomen and pelvis was performed following the standard protocol during bolus administration of intravenous contrast. CONTRAST:  1110mL ISOVUE-300 IOPAMIDOL (ISOVUE-300) INJECTION 61% COMPARISON:  Chest  radiograph 412 18.  FINDINGS: CT CHEST FINDINGS Cardiovascular: Coronary artery calcification and aortic atherosclerotic calcification. Mediastinum/Nodes: No axillary supraclavicular adenopathy. No mediastinal hilar adenopathy. No pericardial fluid. Esophagus normal. Lungs/Pleura: No airspace disease. No pleural fluid. No suspicious nodularity. Airways are normal. Musculoskeletal: No aggressive osseous lesion. CT ABDOMEN AND PELVIS FINDINGS Hepatobiliary: No focal hepatic lesion. Several small gallstones in the gallbladder. No gallbladder inflammation. Pancreas: Pancreas is normal. No ductal dilatation. No pancreatic inflammation. Spleen: Spleen measures 15.0 x 6.4 x 10.7 cm (volume = 540 cm^3). Mildly enlarged. Adrenals/urinary tract: Adrenal glands are normal. No native kidneys identified The transplant kidney in the RIGHT iliac fossa appears edematous. Kidney measures 12.6 cm in length. There is stranding within the fat adjacent to the kidney. No evidence of frank abscess or striated appearance. The transplant renal artery opacifies. Delayed imaging demonstrates no significant excretion of IV contrast. Bladder is collapsed Stomach/Bowel: Stomach, small-bowel and cecum are normal. No evidence bowel dilatation or obstruction. Contrast travels the length of the small bowel and colon to the splenic flexure. Rectosigmoid colon normal. Vascular/Lymphatic: Abdominal aorta is normal caliber with atherosclerotic calcification. There is no retroperitoneal or periportal lymphadenopathy. No pelvic lymphadenopathy. Reproductive: Prostate normal Other: No significant free fluid.  No abscess. 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 No evidence of pulmonary infection. 2. Coronary artery calcification and aortic atherosclerotic calcification. Abdomen / Pelvis Impression: 1. 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  No evidence of frank renal abscess. 2. Renal arteries are opacified. No excretion of IV contrast identified. 3. No abscess in the abdomen pelvis. 4. Mild splenomegaly 5. Renal osteodystrophy. Electronically Signed   By: Suzy Bouchard M.D.   On: 07/19/2016 07:22   Ct Abdomen Pelvis W Contrast  Result Date: 07/19/2016 CLINICAL DATA:  61 y.o. male admitted with 2 weeks of fevers to 101.3. He has a history of end-stage renal disease on dialysis as well as aortic valve replacement. Chest x-ray showed no pneumonia. Pt with ESRD on HD, with a nonfunctioning transplant kidney recently stopped off immunosuppressives now with 2 weeks of fevers. Eval for transplant rejection, PNA or other source of fever EXAM: CT CHEST, ABDOMEN, AND PELVIS WITH CONTRAST TECHNIQUE: Multidetector CT imaging of the chest, abdomen and pelvis was performed following the standard protocol during bolus administration of intravenous contrast. CONTRAST:  178mL ISOVUE-300 IOPAMIDOL (ISOVUE-300) INJECTION 61% COMPARISON:  Chest radiograph 412 18. FINDINGS: CT CHEST FINDINGS Cardiovascular: Coronary artery calcification and aortic atherosclerotic calcification. Mediastinum/Nodes: No axillary supraclavicular adenopathy. No mediastinal hilar adenopathy. No pericardial fluid. Esophagus normal. Lungs/Pleura: No airspace disease. No pleural fluid. No suspicious nodularity. Airways are normal. Musculoskeletal: No aggressive osseous lesion. CT ABDOMEN AND PELVIS FINDINGS Hepatobiliary: No focal hepatic lesion. Several small gallstones in the gallbladder. No gallbladder inflammation. Pancreas: Pancreas is normal. No ductal dilatation. No pancreatic inflammation. Spleen: Spleen measures 15.0 x 6.4 x 10.7 cm (volume = 540 cm^3). Mildly enlarged. Adrenals/urinary tract: Adrenal glands are normal. No native kidneys identified The transplant kidney in the RIGHT iliac fossa appears edematous. Kidney measures 12.6 cm in length. There is stranding within the fat  adjacent to the kidney. No evidence of frank abscess or striated appearance. The transplant renal artery opacifies. Delayed imaging demonstrates no significant excretion of IV contrast. Bladder is collapsed Stomach/Bowel: Stomach, small-bowel and cecum are normal. No evidence bowel dilatation or obstruction. Contrast travels the length of the small bowel and colon to the splenic flexure. Rectosigmoid colon normal. Vascular/Lymphatic: Abdominal aorta  is normal caliber with atherosclerotic calcification. There is no retroperitoneal or periportal lymphadenopathy. No pelvic lymphadenopathy. Reproductive: Prostate normal Other: No significant free fluid.  No abscess. 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 No evidence of pulmonary infection. 2. Coronary artery calcification and aortic atherosclerotic calcification. Abdomen / Pelvis Impression: 1. 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 No evidence of frank renal abscess. 2. Renal arteries are opacified. No excretion of IV contrast identified. 3. No abscess in the abdomen pelvis. 4. Mild splenomegaly 5. Renal osteodystrophy. Electronically Signed   By: Suzy Bouchard M.D.   On: 07/19/2016 07:22    EKG:   Orders placed or performed during the hospital encounter of 07/17/16  . ED EKG 12-Lead  . 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  #1 fevers of unknown origin.-Urine analysis with no infection, chest x-ray with no evidence of any infection. -Blood cultures are pending at this time. -Restarted vancomycin and Zosyn for  now. -Chest thoracic echocardiogram pending to rule out vegetations on his artificial aortic valve. -Infectious disease consult requested.---Input appreciated -Denies any back pain at this time. 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 Currently restarted back on dialysis for more than one year. -Nephrology consulted for the same. -Has left forearm AV fistula. On Tuesday Thursday, Saturday dialysis schedule  #3 anemia of chronic disease-hemoglobin is at 9.7. Monitor closely. Continue Epogen with dialysis.  #4 history of aortic valve replacement-with mechanical valve. Continue Coumadin. INR is at 2.3. Pharmacy to adjust the dose.  #5 CAD-stable. Patient on Coreg, statin. History of stent in the past.  #6 DVT prophylaxis-continue Coumadin   Patient is very active and ambulatory at baseline.  All the records are reviewed and case discussed with Care Management/Social Workerr. Management plans discussed with the patient, family and they are in agreement.  CODE STATUS: Full Code  TOTAL TIME TAKING CARE OF THIS PATIENT: 27 minutes.   POSSIBLE D/C IN 2 DAYS, DEPENDING ON CLINICAL CONDITION.   Jann Milkovich M.D on 07/20/2016 at 12:15 PM  Between 7am to 6pm - Pager - [phone]  After 6pm go to [email or web address] - password [email or web address] [facility]  Office  [phone]  CC: Primary care physician; Kenn File, MD

## 2016-07-20 NOTE — Progress Notes (Signed)
[place] for Warfarin/Vancomycin/Zosyn Indication: atrial fibrillation/fever of unknown origin  Allergies  Allergen Reactions  . Atorvastatin Other (See Comments)    Muscle Aches  . Sertraline Rash    Patient Measurements: Height: 5\' 10"  (177.8 cm) Weight: 186 lb 1.1 oz (84.4 kg) IBW/kg (Calculated) : 73   Vital Signs: Temp: 99.7 F (37.6 C) (04/15 0914) Temp Source: Oral (04/15 0914) BP: 163/55 (04/15 0914) Pulse Rate: 77 (04/15 0914)  Labs:  Recent Labs  07/17/16 1527  07/18/16 0533 07/19/16 0357 07/20/16 0422  HGB 9.7*  --   --  9.7*  --   HCT 29.0*  --   --  29.0*  --   PLT 185  --   --  158  --   LABPROT  --   < > 26.0* 29.8* 29.0*  INR  --   < > 2.33 2.77 2.67  CREATININE 5.37*  --   --  9.60*  --   CKTOTAL 51  --   --   --   --   TROPONINI 0.06*  --   --   --   --   < > = values in this interval not displayed.  Medical History: Past Medical History:  Diagnosis Date  . Anemia   . Arthritis   . Blood transfusion without reported diagnosis   . Cancer ([place])   . Chronic anticoagulation 06/2012  . Dialysis patient [facility]) 01 01 2008  . Hx of colonoscopy    2009 by Dr. Laural Golden. Normal except for small submucosal lipoma. No evidence of polyps or colitis.  . Hyperlipidemia   . Hypertension   . Kidney disease    Chronic kidney disease secondary to IgA nephropathy diagnosed when he was 33. 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 Patient received doxycycline x 1 and is ordered vancomycin and Zosyn.  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   HD: 4/12 (before admission), 4/14  4/12  INR 2.45   Warfarin 2 mg 4/13  INR 2.33   Warfarin 2 mg 4/14  INR 2.77   Warfarin 1.5 mg 4/15  INR 2.67  Goal of Therapy:  INR 2-3   Vancomycin pre-HD level 15-25 mcg/ml  Resolution of Infection   Plan:  1. Will continue Warfarin 1.5 mg daily and follow INR closely due to potential for interaction with antibiotics.   2.ABX:  Zosyn 3.375 g EI q 12 hours.  Vancomycin 1000 mg iv once given 4/12. Will give an additional 1000 mg today for a total loading dose of 2000 mg. Will order vancomycin 1000 mg iv q HD and plan on checking a pre-HD level prior to the 3rd dialysis session with a goal of 15-25 mcg/ml.  (3rd HD likley on Thur 4/19) Cultures all negative so far.    Recent Labs Lab 07/17/16 1527 07/19/16 0357  WBC 7.1 6.5  CREATININE 5.37* 9.60*  LATICACIDVEN 1.5  --     Estimated Creatinine Clearance: 8.6 mL/min (A) (by C-G formula based on SCr of 9.6 mg/dL (H)).  Allergies  Allergen Reactions  . Atorvastatin Other (See Comments)    Muscle Aches  . Sertraline Rash    Antimicrobials this admission: doxycycline 4/12 x 1 4/12 vancomycin >>  4/12 Zosyn >>  Dose adjustments this admission:   Microbiology results: 4/12 BCx: NGTD 4/12 MRSA PCR: negative  Thank you for allowing pharmacy to be a part of this patient's care.  Jevaun Strick A 07/20/2016 10:06 AM

## 2016-07-21 ENCOUNTER — Inpatient Hospital Stay (HOSPITAL_COMMUNITY)
Admit: 2016-07-21 | Discharge: 2016-07-21 | Disposition: A | Discharge status: Home / Self Care | Payer: Medicare Other | Attending: Nurse Practitioner | Admitting: Nurse Practitioner

## 2016-07-21 ENCOUNTER — Encounter
Admission: EM | Disposition: A | Discharge status: Home / Self Care | Payer: Self-pay | Source: Home / Self Care | Attending: Internal Medicine

## 2016-07-21 DIAGNOSIS — I351 Nonrheumatic aortic (valve) insufficiency: Secondary | ICD-10-CM

## 2016-07-21 HISTORY — PX: TEE WITHOUT CARDIOVERSION: SHX5443

## 2016-07-21 LAB — CBC
HEMATOCRIT: 27.6 % — AB (ref 40.0–52.0)
Hemoglobin: 9.1 g/dL — ABNORMAL LOW (ref 13.0–18.0)
MCH: 33.3 pg (ref 26.0–34.0)
MCHC: 33.1 g/dL (ref 32.0–36.0)
MCV: 100.4 fL — AB (ref 80.0–100.0)
Platelets: 194 10*3/uL (ref 150–440)
RBC: 2.75 MIL/uL — ABNORMAL LOW (ref 4.40–5.90)
RDW: 16.2 % — AB (ref 11.5–14.5)
WBC: 8 10*3/uL (ref 3.8–10.6)

## 2016-07-21 LAB — PROTIME-INR
INR: 2.71
Prothrombin Time: 29.3 seconds — ABNORMAL HIGH (ref 11.4–15.2)

## 2016-07-21 LAB — CMV DNA, QUANTITATIVE, PCR
CMV DNA Quant: NEGATIVE IU/mL
LOG10 CMV QN DNA PL: UNDETERMINED {Log_IU}/mL

## 2016-07-21 SURGERY — ECHOCARDIOGRAM, TRANSESOPHAGEAL
Anesthesia: Moderate Sedation

## 2016-07-21 MED ORDER — BUTAMBEN-TETRACAINE-BENZOCAINE 2-2-14 % EX AERO
INHALATION_SPRAY | CUTANEOUS | Status: AC
  Filled 2016-07-21: qty 20

## 2016-07-21 MED ORDER — FENTANYL CITRATE (PF) 100 MCG/2ML IJ SOLN
INTRAMUSCULAR | Status: AC
  Filled 2016-07-21: qty 4

## 2016-07-21 MED ORDER — EPOETIN ALFA 10000 UNIT/ML IJ SOLN
4000.0000 [IU] | INTRAMUSCULAR | Status: DC
  Administered 2016-07-22: 4000 [IU] via INTRAVENOUS

## 2016-07-21 MED ORDER — METHYLPREDNISOLONE SODIUM SUCC 125 MG IJ SOLR
60.0000 mg | Freq: Every day | INTRAMUSCULAR | Status: DC
  Administered 2016-07-21: 60 mg via INTRAVENOUS
  Filled 2016-07-21 (×2): qty 2

## 2016-07-21 MED ORDER — MIDAZOLAM HCL 2 MG/2ML IJ SOLN
INTRAMUSCULAR | Status: AC | PRN
  Administered 2016-07-21: 2 mg via INTRAVENOUS

## 2016-07-21 MED ORDER — LIDOCAINE VISCOUS 2 % MT SOLN
OROMUCOSAL | Status: AC
  Filled 2016-07-21: qty 15

## 2016-07-21 MED ORDER — SODIUM CHLORIDE 0.9 % IV SOLN: INTRAVENOUS | Status: DC

## 2016-07-21 MED ORDER — FENTANYL CITRATE (PF) 100 MCG/2ML IJ SOLN
INTRAMUSCULAR | Status: AC | PRN
  Administered 2016-07-21: 50 ug via INTRAVENOUS

## 2016-07-21 MED ORDER — MIDAZOLAM HCL 5 MG/5ML IJ SOLN
INTRAMUSCULAR | Status: AC
  Filled 2016-07-21: qty 5

## 2016-07-21 NOTE — Progress Notes (Signed)
[place] INFECTIOUS DISEASE PROGRESS NOTE Date of Admission:  07/17/2016     ID: Jeremy [name] is a 61 y.o. male with   Active Problems:   Fever   Fever of unknown origin   Subjective: Still with fevers, mild couigh, denies abd pain except mild RLQ   ROS  Eleven systems are reviewed and negative except per hpi  Medications:  Antibiotics Given (last 72 hours)    Date/Time Action Medication Dose Rate   07/18/16 1825 Given   piperacillin-tazobactam (ZOSYN) IVPB 3.375 g 3.375 g 12.5 mL/hr   07/19/16 0451 Given   piperacillin-tazobactam (ZOSYN) IVPB 3.375 g 3.375 g 12.5 mL/hr   07/19/16 1206 Given  [given last hour of dialysis]   vancomycin (VANCOCIN) IVPB 1000 mg/200 mL premix 1,000 mg 200 mL/hr   07/19/16 1656 Given   piperacillin-tazobactam (ZOSYN) IVPB 3.375 g 3.375 g 12.5 mL/hr   07/20/16 0413 Given   piperacillin-tazobactam (ZOSYN) IVPB 3.375 g 3.375 g 12.5 mL/hr   07/20/16 1706 Given   piperacillin-tazobactam (ZOSYN) IVPB 3.375 g 3.375 g 12.5 mL/hr   07/21/16 0326 Given   piperacillin-tazobactam (ZOSYN) IVPB 3.375 g 3.375 g 12.5 mL/hr     . allopurinol  200 mg Oral Daily  . amLODipine  5 mg Oral QPM  . benzonatate  100 mg Oral TID  . butamben-tetracaine-benzocaine      . carvedilol  12.5 mg Oral BID  . dicyclomine  20 mg Oral BID  . fentaNYL      . lidocaine      . magnesium oxide  400 mg Oral BID  . midazolam      . pantoprazole  40 mg Oral Daily  . piperacillin-tazobactam (ZOSYN)  IV  3.375 g Intravenous Q12H  . pravastatin  40 mg Oral Daily  . predniSONE  5 mg Oral Q breakfast  . sevelamer carbonate  0.8 g Oral TID PC  . sodium chloride flush  3 mL Intravenous Q12H  . vancomycin  1,000 mg Intravenous Q T,Th,Sa-HD  . warfarin  1.5 mg Oral q1800  . Warfarin - Pharmacist Dosing Inpatient   Does not apply q1800    Objective: Vital signs in last 24 hours: Temp:  [98.6 F (37 C)-102.8 F (39.3 C)] 99.3 F (37.4 C) (04/16 1149) Pulse Rate:  [64-88] 80  (04/16 1149) Resp:  [12-30] 18 (04/16 1149) BP: (149-199)/(49-72) 164/57 (04/16 1149) SpO2:  [91 %-98 %] 96 % (04/16 1149) Physical Exam  Constitutional: He is oriented to person, place, and time. He appears well-developed and well-nourished. No distress.  HENT:  Mouth/Throat: Oropharynx is clear and moist. No oropharyngeal exudate.  Cardiovascular: Normal rate, regular rhythm and normal heart sounds. Exam reveals no gallop and no friction rub.  No murmur heard.  Pulmonary/Chest: Effort normal and breath sounds normal. No respiratory distress. He has no wheezes.  Abdominal: Soft. Bowel sounds are normal. He exhibits no distension. There is mild tenderness rlq.  Lymphadenopathy:  He has no cervical adenopathy.  Neurological: He is alert and oriented to person, place, and time.  UE AVF wnl Skin: Skin is warm and dry. No rash noted. No erythema.CVS changes bil LE  Psychiatric: He has a normal mood and affect. His behavior is normal.     Lab Results  Recent Labs  07/19/16 0357 07/21/16 0346  WBC 6.5 8.0  HGB 9.7* 9.1*  HCT 29.0* 27.6*  NA 136  --   K 4.6  --   CL 95*  --  CO2 29  --   BUN 57*  --   CREATININE 9.60*  --     Microbiology: Results for orders placed or performed during the hospital encounter of 07/17/16  Blood Culture (routine x 2)     Status: None (Preliminary result)   Collection Time: 07/17/16  3:27 PM  Result Value Ref Range Status   Specimen Description BLOOD RIGHT HAND  Final   Special Requests   Final    BOTTLES DRAWN AEROBIC AND ANAEROBIC Blood Culture adequate volume   Culture NO GROWTH 4 DAYS  Final   Report Status PENDING  Incomplete  Blood Culture (routine x 2)     Status: None (Preliminary result)   Collection Time: 07/17/16  3:31 PM  Result Value Ref Range Status   Specimen Description BLOOD LEFT HAND  Final   Special Requests   Final    BOTTLES DRAWN AEROBIC AND ANAEROBIC Blood Culture adequate volume   Culture NO GROWTH 4 DAYS  Final    Report Status PENDING  Incomplete  MRSA PCR Screening     Status: None   Collection Time: 07/17/16  6:38 PM  Result Value Ref Range Status   MRSA by PCR NEGATIVE NEGATIVE Final    Comment:        The GeneXpert MRSA Assay (FDA approved for NASAL specimens only), is one component of a comprehensive MRSA colonization surveillance program. It is not intended to diagnose MRSA infection nor to guide or monitor treatment for MRSA infections.   Respiratory Panel by PCR     Status: None   Collection Time: 07/18/16  7:09 PM  Result Value Ref Range Status   Adenovirus NOT DETECTED NOT DETECTED Final   Coronavirus 229E NOT DETECTED NOT DETECTED Final   Coronavirus HKU1 NOT DETECTED NOT DETECTED Final   Coronavirus NL63 NOT DETECTED NOT DETECTED Final   Coronavirus OC43 NOT DETECTED NOT DETECTED Final   Metapneumovirus NOT DETECTED NOT DETECTED Final   Rhinovirus / Enterovirus NOT DETECTED NOT DETECTED Final   Influenza A NOT DETECTED NOT DETECTED Final   Influenza B NOT DETECTED NOT DETECTED Final   Parainfluenza Virus 1 NOT DETECTED NOT DETECTED Final   Parainfluenza Virus 2 NOT DETECTED NOT DETECTED Final   Parainfluenza Virus 3 NOT DETECTED NOT DETECTED Final   Parainfluenza Virus 4 NOT DETECTED NOT DETECTED Final   Respiratory Syncytial Virus NOT DETECTED NOT DETECTED Final   Bordetella pertussis NOT DETECTED NOT DETECTED Final   Chlamydophila pneumoniae NOT DETECTED NOT DETECTED Final   Mycoplasma pneumoniae NOT DETECTED NOT DETECTED Final    Comment: Performed at [facility], [place] [address] [place], [place] [address]  CULTURE, BLOOD (ROUTINE X 2) w Reflex to ID Panel     Status: None (Preliminary result)   Collection Time: 07/18/16  7:56 PM  Result Value Ref Range Status   Specimen Description BLOOD  R HAND  Final   Special Requests BOTTLES DRAWN AEROBIC AND ANAEROBIC  BCHV  Final   Culture NO GROWTH 3 DAYS  Final   Report Status PENDING  Incomplete  CULTURE, BLOOD  (ROUTINE X 2) w Reflex to ID Panel     Status: None (Preliminary result)   Collection Time: 07/18/16  8:08 PM  Result Value Ref Range Status   Specimen Description BLOOD  R AC  Final   Special Requests BOTTLES DRAWN AEROBIC AND ANAEROBIC [place]  Final   Culture NO GROWTH 3 DAYS  Final   Report Status PENDING  Incomplete    Studies/Results: No results found.  Assessment/Plan: Jeremy [name] is a 61 y.o. male with ESRD, Mechanical AVR, s.p failed renal transplant (restarted HD in June) now with 2 weeks of fevers and body aches. He had recently stopped his statins a month ago due to some myalgias which are a little better. He also finished a prolonged taper off his immunosuppressive on April 1st (stopped pred 5 mg qod after a 2 month taper). He has really minimal other sxs except mild HA and Mild cough. No recent procedures, no recent issues with his LUE AVF fistula.  His TTE is done and pending. He has otpt visit to [facility] with [facility] done 4/8 -negative and bcx done on admit negative as well.  Interestingly his Eosinophil count was up to 25% on 4/2, 22 % on 4/4  but is now down to 12%. Initially concerned for several issues, including endocarditis, transplant rejection, atypical viral infection (however off of immunosuppressives for 2 months), or adrenal insufficiency.  So far bcx neg, TTE and TEE negative for endocarditis. Started on low dose pred but still with fevers.  I spoke with Dr Posey Pronto and Dr Candiss Norse as well as with transplant coordinator at [facility].  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  Recommendations Starting on increased dose of steroid today Will dc vanco and zosyn Can consider IN and O cath for UA and UCX    Thank you very much for the consult. Will follow with you.  FITZGERALD, DAVID P   07/21/2016, 1:24 PM

## 2016-07-21 NOTE — Progress Notes (Signed)
[place] for Warfarin/Vancomycin/Zosyn Indication: atrial fibrillation/fever of unknown origin  Allergies  Allergen Reactions  . Atorvastatin Other (See Comments)    Muscle Aches  . Sertraline Rash    Patient Measurements: Height: 5\' 10"  (177.8 cm) Weight: 186 lb 1.1 oz (84.4 kg) IBW/kg (Calculated) : 73   Vital Signs: Temp: 99.5 F (37.5 C) (04/16 1011) Temp Source: Oral (04/16 1011) BP: 199/72 (04/16 1011) Pulse Rate: 79 (04/16 1011)  Labs:  Recent Labs  07/19/16 0357 07/20/16 0422 07/21/16 0346  HGB 9.7*  --  9.1*  HCT 29.0*  --  27.6*  PLT 158  --  194  LABPROT 29.8* 29.0* 29.3*  INR 2.77 2.67 2.71  CREATININE 9.60*  --   --     Medical History: Past Medical History:  Diagnosis Date  . Anemia   . Arthritis   . Blood transfusion without reported diagnosis   . Cancer ([place])   . Chronic anticoagulation 06/2012  . Dialysis patient [facility]) 01 01 2008  . Hx of colonoscopy    2009 by Dr. Laural Golden. Normal except for small submucosal lipoma. No evidence of polyps or colitis.  . Hyperlipidemia   . Hypertension   . 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 Patient received doxycycline x 1 and is ordered vancomycin and Zosyn.  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   Vancomycin 1000 mg iv once given 4/12 with an  additional 1000 mg today for a total loading dose of 2000  mg. HD: 4/12 (before admission), 4/14  4/12  INR 2.45   Warfarin 2 mg 4/13  INR 2.33   Warfarin 2 mg 4/14  INR 2.77   Warfarin 1.5 mg 4/15  INR 2.67   Warfarin 1. 5 mg 4/16  INR 2.7      Goal of Therapy:  INR 2-3   Vancomycin pre-HD level 15-25 mcg/ml  Resolution of Infection   Plan:  1. Will continue Warfarin 1.5 mg daily and follow INR closely due to potential for interaction with antibiotics.   2.ABX:  Zosyn 3.375 g EI q 12 hours.   Will continue Vancomycin 1000 mg iv q HD and plan on checking a pre-HD level prior to the 3rd dialysis session with a goal of 15-25 mcg/ml.  (3rd HD likley on Thur 4/19) Cultures all negative so far.    Recent Labs Lab 07/17/16 1527 07/19/16 0357 07/21/16 0346  WBC 7.1 6.5 8.0  CREATININE 5.37* 9.60*  --   LATICACIDVEN 1.5  --   --     Estimated Creatinine Clearance: 8.6 mL/min (A) (by C-G formula based on SCr of 9.6 mg/dL (H)).    Allergies  Allergen Reactions  . Atorvastatin Other (See Comments)    Muscle Aches  . Sertraline Rash    Antimicrobials this admission: doxycycline 4/12 x 1 4/12 vancomycin >>  4/12 Zosyn >>  Dose adjustments this admission:   Microbiology results: 4/12 BCx: NGTD 4/12 MRSA PCR:  negative  Thank you for allowing pharmacy to be a part of this patient's care.  Bryndle Corredor D 07/21/2016 10:22 AM

## 2016-07-21 NOTE — Progress Notes (Signed)
    [facility] has been requested to perform a transesophageal echocardiogram on Jeremy [name] for SBE.  After careful review of history and examination, the risks and benefits of transesophageal echocardiogram have been explained including risks of esophageal damage, perforation (1:10,000 risk), bleeding, pharyngeal hematoma as well as other potential complications associated with conscious sedation including aspiration, arrhythmia, respiratory failure and death. Alternatives to treatment were discussed, questions were answered. Patient is willing to proceed.   Murray Hodgkins, NP  07/21/2016 9:51 AM

## 2016-07-21 NOTE — Progress Notes (Signed)
*  PRELIMINARY RESULTS* Echocardiogram Echocardiogram Transesophageal has been performed.  Sherrie Sport 07/21/2016, 11:09 AM

## 2016-07-21 NOTE — Progress Notes (Signed)
Patient clinically stable post TEE, tolerated well.  vitals stable. Denies complaints. sr per monitor. Report called to [facility] RN on [place] with plan reviewed.

## 2016-07-21 NOTE — Progress Notes (Signed)
[facility]  ROUNDING NOTE   Subjective:   Tmax 101.3 - continues to have fevers and dry cough Appetite is low but able to eat okay without nausea or vomiting No shortness of breath, leg swelling No dysuria; although makes very little urine Patient scheduled for TEE   Objective:  Vital signs in last 24 hours:  Temp:  [98.6 F (37 C)-102.8 F (39.3 C)] 98.6 F (37 C) (04/16 0501) Pulse Rate:  [64-80] 72 (04/16 0501) Resp:  [16-20] 16 (04/16 0501) BP: (149-173)/(49-99) 149/49 (04/16 0501) SpO2:  [93 %-98 %] 93 % (04/16 0501)  Weight change:  Filed Weights   07/17/16 1457 07/19/16 1000 07/20/16 0341  Weight: 84.4 kg (186 lb) 85.2 kg (187 lb 13.3 oz) 84.4 kg (186 lb 1.1 oz)    Intake/Output: I/O last 3 completed shifts: In: 240 [P.O.:240] Out: 0    Intake/Output this shift:  No intake/output data recorded.  Physical Exam: General: NAD,   Head: Normocephalic, atraumatic. Moist oral mucosal membranes  Eyes: Anicteric,   Neck: Supple, trachea midline  Lungs:  Clear to auscultation  Heart: Regular rate and rhythm  Abdomen:  Soft, nontender, RLQ allograft kidney  Extremities: no peripheral edema.  Neurologic: Nonfocal, moving all four extremities  Skin: No lesions  Access: Left forearm AVF    Basic Metabolic Panel:  Recent Labs Lab 07/17/16 1527 07/19/16 0357  NA 136 136  K 4.0 4.6  CL 96* 95*  CO2 29 29  GLUCOSE 127* 103*  BUN 21* 57*  CREATININE 5.37* 9.60*  CALCIUM 8.9 8.8*    Liver Function Tests:  Recent Labs Lab 07/17/16 1527  AST 25  ALT 13*  ALKPHOS 191*  BILITOT 0.8  PROT 7.9  ALBUMIN 3.0*   No results for input(s): LIPASE, AMYLASE in the last 168 hours. No results for input(s): AMMONIA in the last 168 hours.  CBC:  Recent Labs Lab 07/17/16 1527 07/19/16 0357 07/21/16 0346  WBC 7.1 6.5 8.0  NEUTROABS 3.7  --   --   HGB 9.7* 9.7* 9.1*  HCT 29.0* 29.0* 27.6*  MCV 102.1* 102.6* 100.4*  PLT 185 158 194     Cardiac Enzymes:  Recent Labs Lab 07/17/16 1527  CKTOTAL 51  TROPONINI 0.06*    BNP: Invalid input(s): POCBNP  CBG: No results for input(s): GLUCAP in the last 168 hours.  Microbiology: Results for orders placed or performed during the hospital encounter of 07/17/16  Blood Culture (routine x 2)     Status: None (Preliminary result)   Collection Time: 07/17/16  3:27 PM  Result Value Ref Range Status   Specimen Description BLOOD RIGHT HAND  Final   Special Requests   Final    BOTTLES DRAWN AEROBIC AND ANAEROBIC Blood Culture adequate volume   Culture NO GROWTH 4 DAYS  Final   Report Status PENDING  Incomplete  Blood Culture (routine x 2)     Status: None (Preliminary result)   Collection Time: 07/17/16  3:31 PM  Result Value Ref Range Status   Specimen Description BLOOD LEFT HAND  Final   Special Requests   Final    BOTTLES DRAWN AEROBIC AND ANAEROBIC Blood Culture adequate volume   Culture NO GROWTH 4 DAYS  Final   Report Status PENDING  Incomplete  MRSA PCR Screening     Status: None   Collection Time: 07/17/16  6:38 PM  Result Value Ref Range Status   MRSA by PCR NEGATIVE NEGATIVE Final    Comment:  The GeneXpert MRSA Assay (FDA approved for NASAL specimens only), is one component of a comprehensive MRSA colonization surveillance program. It is not intended to diagnose MRSA infection nor to guide or monitor treatment for MRSA infections.   Respiratory Panel by PCR     Status: None   Collection Time: 07/18/16  7:09 PM  Result Value Ref Range Status   Adenovirus NOT DETECTED NOT DETECTED Final   Coronavirus 229E NOT DETECTED NOT DETECTED Final   Coronavirus HKU1 NOT DETECTED NOT DETECTED Final   Coronavirus NL63 NOT DETECTED NOT DETECTED Final   Coronavirus OC43 NOT DETECTED NOT DETECTED Final   Metapneumovirus NOT DETECTED NOT DETECTED Final   Rhinovirus / Enterovirus NOT DETECTED NOT DETECTED Final   Influenza A NOT DETECTED NOT DETECTED Final    Influenza B NOT DETECTED NOT DETECTED Final   Parainfluenza Virus 1 NOT DETECTED NOT DETECTED Final   Parainfluenza Virus 2 NOT DETECTED NOT DETECTED Final   Parainfluenza Virus 3 NOT DETECTED NOT DETECTED Final   Parainfluenza Virus 4 NOT DETECTED NOT DETECTED Final   Respiratory Syncytial Virus NOT DETECTED NOT DETECTED Final   Bordetella pertussis NOT DETECTED NOT DETECTED Final   Chlamydophila pneumoniae NOT DETECTED NOT DETECTED Final   Mycoplasma pneumoniae NOT DETECTED NOT DETECTED Final    Comment: Performed at [facility], [place] [address], [place], [place] [address]  CULTURE, BLOOD (ROUTINE X 2) w Reflex to ID Panel     Status: None (Preliminary result)   Collection Time: 07/18/16  7:56 PM  Result Value Ref Range Status   Specimen Description BLOOD  R HAND  Final   Special Requests BOTTLES DRAWN AEROBIC AND ANAEROBIC  [place]  Final   Culture NO GROWTH 3 DAYS  Final   Report Status PENDING  Incomplete  CULTURE, BLOOD (ROUTINE X 2) w Reflex to ID Panel     Status: None (Preliminary result)   Collection Time: 07/18/16  8:08 PM  Result Value Ref Range Status   Specimen Description BLOOD  R AC  Final   Special Requests BOTTLES DRAWN AEROBIC AND ANAEROBIC [place]  Final   Culture NO GROWTH 3 DAYS  Final   Report Status PENDING  Incomplete    Coagulation Studies:  Recent Labs  07/19/16 0357 07/20/16 0422 07/21/16 0346  LABPROT 29.8* 29.0* 29.3*  INR 2.77 2.67 2.71    Urinalysis: No results for input(s): COLORURINE, LABSPEC, PHURINE, GLUCOSEU, HGBUR, BILIRUBINUR, KETONESUR, PROTEINUR, UROBILINOGEN, NITRITE, LEUKOCYTESUR in the last 72 hours.  Invalid input(s): APPERANCEUR    Imaging: No results found.   Medications:    . allopurinol  200 mg Oral Daily  . amLODipine  5 mg Oral QPM  . benzonatate  100 mg Oral TID  . carvedilol  12.5 mg Oral BID  . dicyclomine  20 mg Oral BID  . magnesium oxide  400 mg Oral BID  . pantoprazole  40 mg Oral Daily  .  piperacillin-tazobactam (ZOSYN)  IV  3.375 g Intravenous Q12H  . pravastatin  40 mg Oral Daily  . predniSONE  5 mg Oral Q breakfast  . sevelamer carbonate  0.8 g Oral TID PC  . sodium chloride flush  3 mL Intravenous Q12H  . vancomycin  1,000 mg Intravenous Q T,Th,Sa-HD  . warfarin  1.5 mg Oral q1800  . Warfarin - Pharmacist Dosing Inpatient   Does not apply q1800   sodium chloride, acetaminophen, hydrALAZINE, ibuprofen, nitroGLYCERIN, sodium chloride flush  Assessment/ Plan:  Mr. Eustacio Ellen  Fichera is a 61 y.o. white male with end stage renal disease on hemodialysis secondary to IgA nephropathy, status post renal transplant x2, restarted on hemodialysis 11/2015, hypertension, gout, aortic valve replacement on warfarin, hyperlipidemia, coronary artery disease, congestive heart failure, obstructive sleep apnea, who was admitted to [facility] on 07/17/2016 for Fever of unknown origin [R50.9]  TTS [facility] [place] Left AVF  1. End Stage Renal Disease on hemodialysis: TTS. Status post renal transplant x2. Secondary to IgA nephropathy - Continue TTS schedule.   2. Anemia with chronic kidney disease: hemoglobin 9.7. Macrocytic anemia - Mircera as outpatient.  - Add EPO with HD  3. Secondary Hyperparathyroidism:  - sevelamer for binding.   4.  Fever of unknown origin: appreciate ID input. -  Zosyn and vancomycin -  If TEE is negative for infection and no evidence of any other infections, would increase the dose of steroids in the form of Solu-Medrol IV 60 mg daily to address subclinical rejection   LOS: 2 Seema Blum 4/16/20189:35 AM

## 2016-07-21 NOTE — Progress Notes (Signed)
[place] [facility] [place] NAME: Jeremy [name]    MR#:  [id number]  DATE OF BIRTH:  [birth year]  SUBJECTIVE:  Feels okay.spiked fever again 102.0 last pm  REVIEW OF SYSTEMS:  Review of Systems  Constitutional: Positive for fever and malaise/fatigue. Negative for chills.  HENT: Negative for ear discharge, hearing loss and nosebleeds.   Eyes: Negative for blurred vision and double vision.  Respiratory: Negative for cough, shortness of breath and wheezing.   Cardiovascular: Negative for chest pain, palpitations and leg swelling.  Gastrointestinal: Negative for abdominal pain, constipation, diarrhea, nausea and vomiting.  Genitourinary: Negative for dysuria.  Musculoskeletal: Negative for myalgias.  Neurological: Negative for dizziness, speech change, focal weakness, seizures and headaches.  Psychiatric/Behavioral: Negative for depression.    DRUG ALLERGIES:   Allergies  Allergen Reactions  . Atorvastatin Other (See Comments)    Muscle Aches  . Sertraline Rash    VITALS:  Blood pressure (!) 175/66, pulse 70, temperature 99.3 F (37.4 C), temperature source Oral, resp. rate 18, height 5\' 10"  (1.778 m), weight 84.4 kg (186 lb 1.1 oz), SpO2 96 %.  PHYSICAL EXAMINATION:  Physical Exam  GENERAL:  61 y.o.-year-old patient lying in the bed with no acute distress.  EYES: Pupils equal, round, reactive to light and accommodation. No scleral icterus. Extraocular muscles intact.  HEENT: Head atraumatic, normocephalic. Oropharynx and nasopharynx clear.  NECK:  Supple, no jugular venous distention. No thyroid enlargement, no tenderness.  LUNGS: Normal breath sounds bilaterally, no wheezing, rales,rhonchi or crepitation. No use of accessory muscles of respiration. Decreased bibasilar breath sounds noted.  CARDIOVASCULAR: S1, S2 normal. No  rubs, or gallops. 3/6 systolic murmur present ABDOMEN: Soft, nontender, nondistended. Bowel sounds present. No  organomegaly or mass.  EXTREMITIES: No pedal edema, cyanosis, or clubbing.  Left forearm AV fistula with good thrill. No erythema or tenderness noted NEUROLOGIC: Cranial nerves II through XII are intact. Muscle strength 5/5 in all extremities. Sensation intact. Gait not checked.  PSYCHIATRIC:  patient is alert and oriented x 3.  SKIN: No obvious rash, lesion, or ulcer. No rash noted.   LABORATORY PANEL:   CBC  Recent Labs Lab 07/21/16 0346  WBC 8.0  HGB 9.1*  HCT 27.6*  PLT 194   ------------------------------------------------------------------------------------------------------------------  Chemistries   Recent Labs Lab 07/17/16 1527 07/19/16 0357  NA 136 136  K 4.0 4.6  CL 96* 95*  CO2 29 29  GLUCOSE 127* 103*  BUN 21* 57*  CREATININE 5.37* 9.60*  CALCIUM 8.9 8.8*  AST 25  --   ALT 13*  --   ALKPHOS 191*  --   BILITOT 0.8  --    ------------------------------------------------------------------------------------------------------------------  Cardiac Enzymes  Recent Labs Lab 07/17/16 1527  TROPONINI 0.06*   ------------------------------------------------------------------------------------------------------------------  RADIOLOGY:  No results found.  EKG:   Orders placed or performed during the hospital encounter of 07/17/16  . ED EKG 12-Lead  . 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  #1 fevers of unknown origin --Urine analysis with no infection,chest x-ray with no evidence of any infection. -Blood cultures are negative  -Restarted vancomycin and Zosyn for now. -Chest thoracic echocardiogram negative vegetations on his  artificial aortic  valve.---TEE done on 07/21/16 NEGATIVE -Infectious disease consult requested.---Input appreciated . AV fistula does not appear to be infected -So far fever workup essentially negative -Patient now started on IV solumederol for transplant kidney rejection  #2 IgA nephropathy with end-stage renal disease-failed 2 renal transplants in the past. Currently restarted back on dialysis for more than one year. -Has left forearm AV fistula. On Tuesday Thursday, Saturday dialysis schedule  #3 anemia of chronic disease-hemoglobin is at 9.7. Monitor closely. Continue Epogen with dialysis.  #4 history of aortic valve replacement-with mechanical valve. Continue Coumadin. INR is at 2.3. Pharmacy to adjust the dose.  #5 CAD-stable. Patient on Coreg, statin. History of stent in the past.  #6 DVT prophylaxis-continue Coumadin   Patient is very active and ambulatory at baseline. D/w DRS arida,Singh and Fitzgerald.  All the records are reviewed and case discussed with Care Management/Social Workerr. Management plans discussed with the patient, family and they are in agreement.  CODE STATUS: Full Code  TOTAL TIME TAKING CARE OF THIS PATIENT: 25 minutes.   POSSIBLE D/C IN few DAYS, DEPENDING ON CLINICAL CONDITION and fever curve   Harriette Tovey M.D on 07/21/2016 at 7:25 PM  Between 7am to 6pm - Pager - [phone]  After 6pm go to [email or web address] - password [email or web address] [facility] [place] [facility]  Office  [phone]  CC: Primary care physician; Kenn File, MD

## 2016-07-22 ENCOUNTER — Encounter: Payer: Self-pay | Admitting: Cardiovascular Disease

## 2016-07-22 LAB — CBC
HCT: 28.3 % — ABNORMAL LOW (ref 40.0–52.0)
HEMOGLOBIN: 9.4 g/dL — AB (ref 13.0–18.0)
MCH: 33.3 pg (ref 26.0–34.0)
MCHC: 33.2 g/dL (ref 32.0–36.0)
MCV: 100.1 fL — AB (ref 80.0–100.0)
Platelets: 200 10*3/uL (ref 150–440)
RBC: 2.83 MIL/uL — ABNORMAL LOW (ref 4.40–5.90)
RDW: 15.9 % — ABNORMAL HIGH (ref 11.5–14.5)
WBC: 6.8 10*3/uL (ref 3.8–10.6)

## 2016-07-22 LAB — CULTURE, BLOOD (ROUTINE X 2)
CULTURE: NO GROWTH
CULTURE: NO GROWTH
Special Requests: ADEQUATE
Special Requests: ADEQUATE

## 2016-07-22 LAB — HSV(HERPES SIMPLEX VRS) I + II AB-IGG
HSV 1 GLYCOPROTEIN G AB, IGG: 38.5 {index} — AB (ref 0.00–0.90)
HSV 2 Glycoprotein G Ab, IgG: <0.91 index (ref 0.00–0.90)

## 2016-07-22 LAB — QUANTIFERON IN TUBE
QFT TB AG MINUS NIL VALUE: 0.01 [IU]/mL
QUANTIFERON MITOGEN VALUE: 6.29 [IU]/mL
QUANTIFERON TB AG VALUE: 0.09 [IU]/mL
QUANTIFERON TB GOLD: NEGATIVE
Quantiferon Nil Value: 0.08 IU/mL

## 2016-07-22 LAB — RENAL FUNCTION PANEL
ALBUMIN: 2.8 g/dL — AB (ref 3.5–5.0)
Anion gap: 17 — ABNORMAL HIGH (ref 5–15)
BUN: 78 mg/dL — ABNORMAL HIGH (ref 6–20)
CHLORIDE: 96 mmol/L — AB (ref 101–111)
CO2: 22 mmol/L (ref 22–32)
Calcium: 9.2 mg/dL (ref 8.9–10.3)
Creatinine, Ser: 12.89 mg/dL — ABNORMAL HIGH (ref 0.61–1.24)
GFR calc Af Amer: 4 mL/min — ABNORMAL LOW (ref >60)
GFR calc non Af Amer: 4 mL/min — ABNORMAL LOW (ref >60)
GLUCOSE: 208 mg/dL — AB (ref 65–99)
PHOSPHORUS: 6.3 mg/dL — AB (ref 2.5–4.6)
POTASSIUM: 5 mmol/L (ref 3.5–5.1)
Sodium: 135 mmol/L (ref 135–145)

## 2016-07-22 LAB — QUANTIFERON TB GOLD ASSAY (BLOOD)

## 2016-07-22 LAB — PROTIME-INR
INR: 3.43
Prothrombin Time: 35.4 seconds — ABNORMAL HIGH (ref 11.4–15.2)

## 2016-07-22 MED ORDER — PREDNISONE 20 MG PO TABS: 60.0000 mg | ORAL_TABLET | Freq: Every day | ORAL | Status: DC

## 2016-07-22 MED ORDER — WARFARIN SODIUM 1 MG PO TABS: 1.0000 mg | ORAL_TABLET | Freq: Every day | ORAL | 0 refills | Status: DC

## 2016-07-22 MED ORDER — PREDNISONE 20 MG PO TABS: 60.0000 mg | ORAL_TABLET | Freq: Every day | ORAL | 0 refills | Status: DC

## 2016-07-22 NOTE — Progress Notes (Signed)
Post hd assessment 

## 2016-07-22 NOTE — Progress Notes (Signed)
[place] for Warfarin Indication: atrial fibrillation  Allergies  Allergen Reactions  . Atorvastatin Other (See Comments)    Muscle Aches  . Sertraline Rash    Patient Measurements: Height: 5\' 10"  (177.8 cm) Weight: 190 lb 0.6 oz (86.2 kg) IBW/kg (Calculated) : 73   Vital Signs: Temp: 98 F (36.7 C) (04/17 1250) Temp Source: Oral (04/17 1250) BP: 168/65 (04/17 1300) Pulse Rate: 73 (04/17 1341)  Labs:  Recent Labs  07/20/16 0422 07/21/16 0346 07/22/16 0500 07/22/16 0925  HGB  --  9.1*  --  9.4*  HCT  --  27.6*  --  28.3*  PLT  --  194  --  200  LABPROT 29.0* 29.3*  --  35.4*  INR 2.67 2.71  --  3.43  CREATININE  --   --  12.89*  --     Medical History: Past Medical History:  Diagnosis Date  . Anemia   . Arthritis   . Blood transfusion without reported diagnosis   . Cancer ([place])   . Chronic anticoagulation 06/2012  . Dialysis patient [facility]) 01 01 2008  . Hx of colonoscopy    2009 by Dr. Laural Golden. Normal except for small submucosal lipoma. No evidence of polyps or colitis.  . Hyperlipidemia   . Hypertension   . 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 Patient received doxycycline x 1 and is ordered vancomycin and Zosyn.  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   Vancomycin 1000 mg iv once given 4/12 with an  additional 1000 mg today for a total loading dose of  2000 mg. HD: 4/12 (before admission), 4/14  4/12  INR 2.45   Warfarin 2 mg 4/13  INR 2.33   Warfarin 2 mg 4/14  INR 2.77   Warfarin 1.5 mg 4/15  INR 2.67   Warfarin 1. 5 mg 4/16  INR 2.7   1.5 4/17  INR: 3.24       Hold   Goal of Therapy:  INR 2-3   Vancomycin pre-HD level 15-25 mcg/ml  Resolution of Infection   Plan:  1. Will hold Warfarin 1.5 mg x 1 dose  and follow INR closely due to potential for interaction with antibiotics.       Recent Labs Lab 07/17/16 1527 07/19/16 0357 07/21/16 0346 07/22/16 0500 07/22/16 0925  WBC 7.1 6.5 8.0  --  6.8  CREATININE 5.37* 9.60*  --  12.89*  --   LATICACIDVEN 1.5  --   --   --   --     Estimated Creatinine Clearance: 6.4 mL/min (A) (by C-G formula based on SCr of 12.89 mg/dL (H)).    Allergies  Allergen Reactions  . Atorvastatin Other (See Comments)    Muscle Aches  . Sertraline Rash    Antimicrobials this admission:   Dose adjustments this admission:   Microbiology results: 4/12 BCx: NGTD 4/12 MRSA PCR: negative  Thank you for allowing pharmacy to be a  part of this patient's care.  Simrah Chatham D 07/22/2016 2:06 PM

## 2016-07-22 NOTE — Progress Notes (Signed)
Hd start 

## 2016-07-22 NOTE — Discharge Summary (Signed)
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 Anemia   . Arthritis   . Blood transfusion without reported diagnosis   . Cancer ([place])   . Chronic anticoagulation 06/2012  . Dialysis patient [facility]) 01 01 2008  . Hx of colonoscopy    2009 by Dr. Laural Golden. Normal except for small submucosal lipoma. No evidence of polyps or colitis.  . Hyperlipidemia   . Hypertension   . Kidney disease    Chronic kidney disease secondary to IgA nephropathy diagnosed when he was 71. 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  #1 fevers of unknown origin suspected due to Subclinical --Urine analysis with no infection,chest x-ray with no evidence of any infection. -Blood cultures are negative  Pt was on vancomycin and Zosyn  -Chest thoracic echocardiogram negative vegetations on his artificial aortic valve.---TEE done on 07/21/16 NEGATIVE -Infectious disease consult requested.---Input appreciated . 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 Currently restarted back on dialysis for more than one year. -Has left forearm AV fistula. On Tuesday Thursday, Saturday dialysis schedule  #3 anemia of chronic disease-hemoglobin is at 9.7. Monitor closely. Continue Epogen with dialysis.  #4 history of aortic valve replacement-with mechanical valve. Continue Coumadin. INR is at 3.43 Pharmacy to adjust the dose. -no coumadin today. Pt to take from tomorrow 1 mg and check INR and d/w MD regarding dosing.  #5 CAD-stable. Patient on Coreg, statin. History of stent in the past.  #6 DVT prophylaxis-continue Coumadin   Patient is very active and ambulatory at baseline. D/w DRS arida,Singh and Fitzgerald.  D/c plans d/w pt CONSULTS OBTAINED:  Treatment Team:  Leonel Ramsay, MD Lavonia Dana, MD  DRUG ALLERGIES:   Allergies  Allergen Reactions  . Atorvastatin Other (See Comments)    Muscle Aches  . Sertraline Rash    DISCHARGE MEDICATIONS:   Current Discharge Medication List    START taking these medications   Details  predniSONE (DELTASONE) 20 MG tablet Take 3 tablets (60 mg total) by mouth daily with breakfast. Qty: 35 tablet, Refills: 0      CONTINUE these medications which have CHANGED   Details  warfarin (COUMADIN) 1 MG tablet Take 1 tablet (1 mg total) by mouth daily at 6 PM. Qty: 30 tablet,  Refills: 0      CONTINUE these medications which have NOT CHANGED   Details  acetaminophen (TYLENOL) 325 MG tablet Take 650 mg by mouth every 6 (six) hours as needed.    allopurinol (ZYLOPRIM) 100 MG tablet Take 200 mg by mouth daily.     amLODipine (NORVASC) 5 MG tablet Take 5 mg by mouth daily.     carvedilol (COREG) 12.5 MG tablet Take 12.5 mg by mouth 2 (two) times daily.    dicyclomine (BENTYL) 10 MG capsule Take 2 capsules (20 mg total) by mouth 3 (three) times daily before meals. Qty: 90 capsule, Refills: 4   Associated Diagnoses: Diarrhea, unspecified type    diphenoxylate-atropine (LOMOTIL) 2.5-0.025 MG tablet Take 1 tablet by mouth 4 (four) times daily as needed for diarrhea or loose stools. Qty: 60 tablet, Refills: 0   Associated Diagnoses: Chronic diarrhea    lidocaine-prilocaine (EMLA) cream Apply topically.    loratadine (CLARITIN) 10 MG tablet Take 10 mg by mouth daily as needed. Reported on 06/12/2015    magnesium oxide (MAG-OX) 400 (241.3 MG) MG tablet Take 400 mg by mouth 2 (two) times daily. Refills: 1    Omega-3 1000 MG CAPS Take 3 g by mouth.    omeprazole (PRILOSEC) 20 MG capsule Take 20 mg by mouth daily.    Sevelamer Carbonate (RENVELA PO) Take 1 tablet by mouth 3 (three) times daily after meals.    nitroGLYCERIN (NITROSTAT) 0.4 MG SL tablet Place 1 tablet (0.4 mg total) under the tongue every 5 (five) minutes as needed for chest pain. Qty: 25 tablet, Refills: 2    pravastatin (PRAVACHOL) 40 MG tablet TAKE 1 TABLET BY MOUTH EVERY DAY        If you experience worsening of your admission symptoms, develop shortness of breath, life threatening emergency, suicidal or homicidal thoughts you must seek medical attention immediately by calling 911 or calling your MD immediately  if symptoms less severe.  You Must read complete instructions/literature along with all the possible adverse reactions/side effects for all the Medicines you take and that have been prescribed to you. Take any new Medicines after you have completely understood and accept all the possible adverse reactions/side effects.   Please note  You were cared for by a hospitalist during your hospital stay. If you have any questions about your discharge medications or the care you received while  you were in the hospital after you are discharged, you can call the unit and asked to speak with the hospitalist on call if the hospitalist that took care of you is not available. Once you are discharged, your primary care physician will handle any further medical issues. Please note that NO REFILLS for any discharge medications will be authorized once you are discharged, as it is imperative that you return to your primary care physician (or establish a relationship with a primary care physician if you do not have one) for your aftercare needs so that they can reassess your need for medications and monitor your lab values. Today   SUBJECTIVE   Doing well  VITAL SIGNS:  Blood pressure (!) 151/64, pulse (!) 59, temperature 97.9 F (36.6 C), temperature source Oral, resp. rate 13, height 5\' 10"  (1.778 m), weight 86.2 kg (190 lb 0.6 oz), SpO2 100 %.  I/O:    Intake/Output Summary (Last 24 hours) at 07/22/16 1216 Last data filed at 07/21/16 1625  Gross per 24 hour  Intake  240 ml  Output                0 ml  Net              240 ml    PHYSICAL EXAMINATION:  GENERAL:  61 y.o.-year-old patient lying in the bed with no acute distress.  EYES: Pupils equal, round, reactive to light and accommodation. No scleral icterus. Extraocular muscles intact.  HEENT: Head atraumatic, normocephalic. Oropharynx and nasopharynx clear.  NECK:  Supple, no jugular venous distention. No thyroid enlargement, no tenderness.  LUNGS: Normal breath sounds bilaterally, no wheezing, rales,rhonchi or crepitation. No use of accessory muscles of respiration.  CARDIOVASCULAR: S1, S2 normal. No murmurs, rubs, or gallops.  ABDOMEN: Soft, non-tender, non-distended. Bowel sounds present. No organomegaly or mass.  EXTREMITIES: No pedal edema, cyanosis, or clubbing.  NEUROLOGIC: Cranial nerves II through XII are intact. Muscle strength 5/5 in all extremities. Sensation intact. Gait not checked.  PSYCHIATRIC: The  patient is alert and oriented x 3.  SKIN: No obvious rash, lesion, or ulcer.   DATA REVIEW:   CBC   Recent Labs Lab 07/22/16 0925  WBC 6.8  HGB 9.4*  HCT 28.3*  PLT 200    Chemistries   Recent Labs Lab 07/17/16 1527  07/22/16 0500  NA 136  < > 135  K 4.0  < > 5.0  CL 96*  < > 96*  CO2 29  < > 22  GLUCOSE 127*  < > 208*  BUN 21*  < > 78*  CREATININE 5.37*  < > 12.89*  CALCIUM 8.9  < > 9.2  AST 25  --   --   ALT 13*  --   --   ALKPHOS 191*  --   --   BILITOT 0.8  --   --   < > = values in this interval not displayed.  Microbiology Results   Recent Results (from the past 240 hour(s))  Blood Culture (routine x 2)     Status: None   Collection Time: 07/17/16  3:27 PM  Result Value Ref Range Status   Specimen Description BLOOD RIGHT HAND  Final   Special Requests   Final    BOTTLES DRAWN AEROBIC AND ANAEROBIC Blood Culture adequate volume   Culture NO GROWTH 5 DAYS  Final   Report Status 07/22/2016 FINAL  Final  Blood Culture (routine x 2)     Status: None   Collection Time: 07/17/16  3:31 PM  Result Value Ref Range Status   Specimen Description BLOOD LEFT HAND  Final   Special Requests   Final    BOTTLES DRAWN AEROBIC AND ANAEROBIC Blood Culture adequate volume   Culture NO GROWTH 5 DAYS  Final   Report Status 07/22/2016 FINAL  Final  MRSA PCR Screening     Status: None   Collection Time: 07/17/16  6:38 PM  Result Value Ref Range Status   MRSA by PCR NEGATIVE NEGATIVE Final    Comment:        The GeneXpert MRSA Assay (FDA approved for NASAL specimens only), is one component of a comprehensive MRSA colonization surveillance program. It is not intended to diagnose MRSA infection nor to guide or monitor treatment for MRSA infections.   Respiratory Panel by PCR     Status: None   Collection Time: 07/18/16  7:09 PM  Result Value Ref Range Status   Adenovirus NOT DETECTED NOT DETECTED Final   Coronavirus 229E NOT DETECTED NOT DETECTED  Final    Coronavirus HKU1 NOT DETECTED NOT DETECTED Final   Coronavirus NL63 NOT DETECTED NOT DETECTED Final   Coronavirus OC43 NOT DETECTED NOT DETECTED Final   Metapneumovirus NOT DETECTED NOT DETECTED Final   Rhinovirus / Enterovirus NOT DETECTED NOT DETECTED Final   Influenza A NOT DETECTED NOT DETECTED Final   Influenza B NOT DETECTED NOT DETECTED Final   Parainfluenza Virus 1 NOT DETECTED NOT DETECTED Final   Parainfluenza Virus 2 NOT DETECTED NOT DETECTED Final   Parainfluenza Virus 3 NOT DETECTED NOT DETECTED Final   Parainfluenza Virus 4 NOT DETECTED NOT DETECTED Final   Respiratory Syncytial Virus NOT DETECTED NOT DETECTED Final   Bordetella pertussis NOT DETECTED NOT DETECTED Final   Chlamydophila pneumoniae NOT DETECTED NOT DETECTED Final   Mycoplasma pneumoniae NOT DETECTED NOT DETECTED Final    Comment: Performed at [facility], [place] [address], [place], [place] [address]  CULTURE, BLOOD (ROUTINE X 2) w Reflex to ID Panel     Status: None (Preliminary result)   Collection Time: 07/18/16  7:56 PM  Result Value Ref Range Status   Specimen Description BLOOD  R HAND  Final   Special Requests BOTTLES DRAWN AEROBIC AND ANAEROBIC  BCHV  Final   Culture NO GROWTH 4 DAYS  Final   Report Status PENDING  Incomplete  CULTURE, BLOOD (ROUTINE X 2) w Reflex to ID Panel     Status: None (Preliminary result)   Collection Time: 07/18/16  8:08 PM  Result Value Ref Range Status   Specimen Description BLOOD  R AC  Final   Special Requests BOTTLES DRAWN AEROBIC AND ANAEROBIC [place]  Final   Culture NO GROWTH 4 DAYS  Final   Report Status PENDING  Incomplete    RADIOLOGY:  No results found.   Management plans discussed with the patient, family and they are in agreement.  CODE STATUS:     Code Status Orders        Start     Ordered   07/17/16 1849  Full code  Continuous     07/17/16 1848    Code Status History    Date Active Date Inactive Code Status Order ID Comments User Context    This patient has a current code status but no historical code status.      TOTAL TIME TAKING CARE OF THIS PATIENT: 40 minutes.    Libi Corso M.D on 07/22/2016 at 12:16 PM  Between 7am to 6pm - Pager - [phone] After 6pm go to [email or web address] - password [email or web address] [facility]  Office  [phone]  CC: Primary care physician; Kenn File, MD

## 2016-07-22 NOTE — Discharge Instructions (Signed)
Follow up with your Transplant team at [facility] center

## 2016-07-22 NOTE — Care Management Important Message (Signed)
Important Message  Patient Details  Name: Jeremy [name] MRN: [id number] Date of Birth: [birth year]   Medicare Important Message Given:  Yes    Shelbie Ammons, RN 07/22/2016, 8:31 AM

## 2016-07-22 NOTE — Progress Notes (Signed)
Pre hd vitals 

## 2016-07-22 NOTE — Progress Notes (Signed)
Pre hd assessment  

## 2016-07-22 NOTE — Progress Notes (Signed)
  End of hd 

## 2016-07-23 LAB — CULTURE, BLOOD (ROUTINE X 2)
CULTURE: NO GROWTH
Culture: NO GROWTH

## 2016-07-24 ENCOUNTER — Ambulatory Visit (INDEPENDENT_AMBULATORY_CARE_PROVIDER_SITE_OTHER): Payer: Self-pay | Admitting: Pharmacist

## 2016-07-24 DIAGNOSIS — Z7901 Long term (current) use of anticoagulants: Secondary | ICD-10-CM

## 2016-07-24 DIAGNOSIS — Z992 Dependence on renal dialysis: Secondary | ICD-10-CM | POA: Diagnosis not present

## 2016-07-24 DIAGNOSIS — Z5181 Encounter for therapeutic drug level monitoring: Secondary | ICD-10-CM

## 2016-07-24 DIAGNOSIS — N2581 Secondary hyperparathyroidism of renal origin: Secondary | ICD-10-CM | POA: Diagnosis not present

## 2016-07-24 DIAGNOSIS — D631 Anemia in chronic kidney disease: Secondary | ICD-10-CM | POA: Diagnosis not present

## 2016-07-24 DIAGNOSIS — N186 End stage renal disease: Secondary | ICD-10-CM | POA: Diagnosis not present

## 2016-07-24 DIAGNOSIS — Z952 Presence of prosthetic heart valve: Secondary | ICD-10-CM

## 2016-07-24 LAB — POCT INR: INR: 5

## 2016-07-26 DIAGNOSIS — Z992 Dependence on renal dialysis: Secondary | ICD-10-CM | POA: Diagnosis not present

## 2016-07-26 DIAGNOSIS — N186 End stage renal disease: Secondary | ICD-10-CM | POA: Diagnosis not present

## 2016-07-26 DIAGNOSIS — N2581 Secondary hyperparathyroidism of renal origin: Secondary | ICD-10-CM | POA: Diagnosis not present

## 2016-07-26 DIAGNOSIS — D631 Anemia in chronic kidney disease: Secondary | ICD-10-CM | POA: Diagnosis not present

## 2016-07-29 ENCOUNTER — Ambulatory Visit: Payer: Medicare Other | Admitting: Family Medicine

## 2016-07-29 DIAGNOSIS — D631 Anemia in chronic kidney disease: Secondary | ICD-10-CM | POA: Diagnosis not present

## 2016-07-29 DIAGNOSIS — N186 End stage renal disease: Secondary | ICD-10-CM | POA: Diagnosis not present

## 2016-07-29 DIAGNOSIS — Z992 Dependence on renal dialysis: Secondary | ICD-10-CM | POA: Diagnosis not present

## 2016-07-29 DIAGNOSIS — N2581 Secondary hyperparathyroidism of renal origin: Secondary | ICD-10-CM | POA: Diagnosis not present

## 2016-07-30 DIAGNOSIS — Z992 Dependence on renal dialysis: Secondary | ICD-10-CM | POA: Diagnosis not present

## 2016-07-30 DIAGNOSIS — N2581 Secondary hyperparathyroidism of renal origin: Secondary | ICD-10-CM | POA: Diagnosis not present

## 2016-07-30 DIAGNOSIS — D631 Anemia in chronic kidney disease: Secondary | ICD-10-CM | POA: Diagnosis not present

## 2016-07-30 DIAGNOSIS — N186 End stage renal disease: Secondary | ICD-10-CM | POA: Diagnosis not present

## 2016-07-31 DIAGNOSIS — Z9861 Coronary angioplasty status: Secondary | ICD-10-CM | POA: Diagnosis not present

## 2016-07-31 DIAGNOSIS — Z940 Kidney transplant status: Secondary | ICD-10-CM | POA: Diagnosis not present

## 2016-07-31 DIAGNOSIS — I509 Heart failure, unspecified: Secondary | ICD-10-CM | POA: Diagnosis not present

## 2016-07-31 DIAGNOSIS — R509 Fever, unspecified: Secondary | ICD-10-CM | POA: Diagnosis not present

## 2016-07-31 DIAGNOSIS — Z7901 Long term (current) use of anticoagulants: Secondary | ICD-10-CM | POA: Diagnosis not present

## 2016-07-31 DIAGNOSIS — Z952 Presence of prosthetic heart valve: Secondary | ICD-10-CM | POA: Diagnosis not present

## 2016-07-31 DIAGNOSIS — Z992 Dependence on renal dialysis: Secondary | ICD-10-CM | POA: Diagnosis not present

## 2016-07-31 DIAGNOSIS — Z7952 Long term (current) use of systemic steroids: Secondary | ICD-10-CM | POA: Diagnosis not present

## 2016-07-31 DIAGNOSIS — I132 Hypertensive heart and chronic kidney disease with heart failure and with stage 5 chronic kidney disease, or end stage renal disease: Secondary | ICD-10-CM | POA: Diagnosis not present

## 2016-07-31 DIAGNOSIS — D8989 Other specified disorders involving the immune mechanism, not elsewhere classified: Secondary | ICD-10-CM | POA: Diagnosis not present

## 2016-07-31 DIAGNOSIS — E785 Hyperlipidemia, unspecified: Secondary | ICD-10-CM | POA: Diagnosis not present

## 2016-07-31 DIAGNOSIS — Z792 Long term (current) use of antibiotics: Secondary | ICD-10-CM | POA: Diagnosis not present

## 2016-07-31 DIAGNOSIS — T8612 Kidney transplant failure: Secondary | ICD-10-CM

## 2016-07-31 DIAGNOSIS — Z4822 Encounter for aftercare following kidney transplant: Secondary | ICD-10-CM | POA: Diagnosis not present

## 2016-07-31 DIAGNOSIS — Z888 Allergy status to other drugs, medicaments and biological substances status: Secondary | ICD-10-CM | POA: Diagnosis not present

## 2016-07-31 DIAGNOSIS — Z79899 Other long term (current) drug therapy: Secondary | ICD-10-CM | POA: Diagnosis not present

## 2016-07-31 DIAGNOSIS — N186 End stage renal disease: Secondary | ICD-10-CM | POA: Diagnosis not present

## 2016-07-31 DIAGNOSIS — T8611 Kidney transplant rejection: Secondary | ICD-10-CM | POA: Insufficient documentation

## 2016-07-31 DIAGNOSIS — Z85528 Personal history of other malignant neoplasm of kidney: Secondary | ICD-10-CM | POA: Diagnosis not present

## 2016-07-31 DIAGNOSIS — D631 Anemia in chronic kidney disease: Secondary | ICD-10-CM | POA: Diagnosis not present

## 2016-07-31 DIAGNOSIS — I2510 Atherosclerotic heart disease of native coronary artery without angina pectoris: Secondary | ICD-10-CM | POA: Diagnosis not present

## 2016-07-31 DIAGNOSIS — R609 Edema, unspecified: Secondary | ICD-10-CM | POA: Diagnosis not present

## 2016-08-01 ENCOUNTER — Telehealth: Payer: Self-pay | Admitting: Physician Assistant

## 2016-08-01 LAB — POCT INR: INR: 1.6

## 2016-08-01 NOTE — Telephone Encounter (Signed)
Recheck 1 week?

## 2016-08-01 NOTE — Telephone Encounter (Signed)
Patient aware of results and verbalizes understanding.  

## 2016-08-01 NOTE — Telephone Encounter (Signed)
Patient states that he checked it again today and it is 2.5 what do you recommend now?

## 2016-08-02 DIAGNOSIS — D631 Anemia in chronic kidney disease: Secondary | ICD-10-CM | POA: Diagnosis not present

## 2016-08-02 DIAGNOSIS — N186 End stage renal disease: Secondary | ICD-10-CM | POA: Diagnosis not present

## 2016-08-02 DIAGNOSIS — Z992 Dependence on renal dialysis: Secondary | ICD-10-CM | POA: Diagnosis not present

## 2016-08-02 DIAGNOSIS — N2581 Secondary hyperparathyroidism of renal origin: Secondary | ICD-10-CM | POA: Diagnosis not present

## 2016-08-04 ENCOUNTER — Encounter (HOSPITAL_COMMUNITY): Payer: Medicare Other

## 2016-08-04 ENCOUNTER — Encounter (HOSPITAL_BASED_OUTPATIENT_CLINIC_OR_DEPARTMENT_OTHER): Payer: Medicare Other | Admitting: Oncology

## 2016-08-04 ENCOUNTER — Encounter (HOSPITAL_BASED_OUTPATIENT_CLINIC_OR_DEPARTMENT_OTHER): Payer: Medicare Other

## 2016-08-04 ENCOUNTER — Ambulatory Visit (INDEPENDENT_AMBULATORY_CARE_PROVIDER_SITE_OTHER): Payer: Self-pay | Admitting: Pharmacist

## 2016-08-04 ENCOUNTER — Telehealth: Payer: Self-pay | Admitting: Family Medicine

## 2016-08-04 ENCOUNTER — Encounter (HOSPITAL_COMMUNITY): Payer: Self-pay | Admitting: Oncology

## 2016-08-04 VITALS — BP 157/68 | HR 65 | Temp 98.0°F | Resp 16 | Ht 70.0 in | Wt 193.0 lb

## 2016-08-04 DIAGNOSIS — Z9889 Other specified postprocedural states: Secondary | ICD-10-CM | POA: Diagnosis not present

## 2016-08-04 DIAGNOSIS — M1990 Unspecified osteoarthritis, unspecified site: Secondary | ICD-10-CM | POA: Diagnosis not present

## 2016-08-04 DIAGNOSIS — I129 Hypertensive chronic kidney disease with stage 1 through stage 4 chronic kidney disease, or unspecified chronic kidney disease: Secondary | ICD-10-CM | POA: Diagnosis not present

## 2016-08-04 DIAGNOSIS — Z992 Dependence on renal dialysis: Secondary | ICD-10-CM

## 2016-08-04 DIAGNOSIS — E291 Testicular hypofunction: Secondary | ICD-10-CM

## 2016-08-04 DIAGNOSIS — D631 Anemia in chronic kidney disease: Secondary | ICD-10-CM

## 2016-08-04 DIAGNOSIS — N186 End stage renal disease: Secondary | ICD-10-CM | POA: Diagnosis not present

## 2016-08-04 DIAGNOSIS — Z954 Presence of other heart-valve replacement: Secondary | ICD-10-CM | POA: Diagnosis not present

## 2016-08-04 DIAGNOSIS — Z85528 Personal history of other malignant neoplasm of kidney: Secondary | ICD-10-CM | POA: Diagnosis not present

## 2016-08-04 DIAGNOSIS — N183 Chronic kidney disease, stage 3 unspecified: Secondary | ICD-10-CM

## 2016-08-04 DIAGNOSIS — C649 Malignant neoplasm of unspecified kidney, except renal pelvis: Secondary | ICD-10-CM

## 2016-08-04 DIAGNOSIS — G4733 Obstructive sleep apnea (adult) (pediatric): Secondary | ICD-10-CM | POA: Diagnosis not present

## 2016-08-04 DIAGNOSIS — T8610 Unspecified complication of kidney transplant: Secondary | ICD-10-CM | POA: Diagnosis not present

## 2016-08-04 DIAGNOSIS — Z7901 Long term (current) use of anticoagulants: Secondary | ICD-10-CM | POA: Diagnosis not present

## 2016-08-04 DIAGNOSIS — Z5181 Encounter for therapeutic drug level monitoring: Secondary | ICD-10-CM

## 2016-08-04 DIAGNOSIS — Z79899 Other long term (current) drug therapy: Secondary | ICD-10-CM | POA: Diagnosis not present

## 2016-08-04 DIAGNOSIS — Z940 Kidney transplant status: Secondary | ICD-10-CM | POA: Diagnosis not present

## 2016-08-04 DIAGNOSIS — E785 Hyperlipidemia, unspecified: Secondary | ICD-10-CM | POA: Diagnosis not present

## 2016-08-04 DIAGNOSIS — N189 Chronic kidney disease, unspecified: Secondary | ICD-10-CM | POA: Diagnosis not present

## 2016-08-04 DIAGNOSIS — Z952 Presence of prosthetic heart valve: Secondary | ICD-10-CM | POA: Diagnosis not present

## 2016-08-04 DIAGNOSIS — D649 Anemia, unspecified: Secondary | ICD-10-CM | POA: Diagnosis not present

## 2016-08-04 HISTORY — DX: Malignant neoplasm of unspecified kidney, except renal pelvis: C64.9

## 2016-08-04 LAB — COMPREHENSIVE METABOLIC PANEL
ALBUMIN: 3 g/dL — AB (ref 3.5–5.0)
ALT: 28 U/L (ref 17–63)
AST: 24 U/L (ref 15–41)
Alkaline Phosphatase: 168 U/L — ABNORMAL HIGH (ref 38–126)
Anion gap: 15 (ref 5–15)
BUN: 77 mg/dL — AB (ref 6–20)
CHLORIDE: 99 mmol/L — AB (ref 101–111)
CO2: 23 mmol/L (ref 22–32)
CREATININE: 9.36 mg/dL — AB (ref 0.61–1.24)
Calcium: 8.5 mg/dL — ABNORMAL LOW (ref 8.9–10.3)
GFR calc Af Amer: 6 mL/min — ABNORMAL LOW (ref >60)
GFR, EST NON AFRICAN AMERICAN: 5 mL/min — AB (ref >60)
GLUCOSE: 149 mg/dL — AB (ref 65–99)
Potassium: 4.5 mmol/L (ref 3.5–5.1)
SODIUM: 137 mmol/L (ref 135–145)
Total Bilirubin: 0.5 mg/dL (ref 0.3–1.2)
Total Protein: 7.4 g/dL (ref 6.5–8.1)

## 2016-08-04 LAB — CBC WITH DIFFERENTIAL/PLATELET
BASOS ABS: 0 10*3/uL (ref 0.0–0.1)
BASOS PCT: 0 %
EOS PCT: 0 %
Eosinophils Absolute: 0 10*3/uL (ref 0.0–0.7)
HCT: 31.3 % — ABNORMAL LOW (ref 39.0–52.0)
Hemoglobin: 9.9 g/dL — ABNORMAL LOW (ref 13.0–17.0)
LYMPHS PCT: 13 %
Lymphs Abs: 1.5 10*3/uL (ref 0.7–4.0)
MCH: 33.3 pg (ref 26.0–34.0)
MCHC: 31.6 g/dL (ref 30.0–36.0)
MCV: 105.4 fL — AB (ref 78.0–100.0)
Monocytes Absolute: 0.3 10*3/uL (ref 0.1–1.0)
Monocytes Relative: 3 %
Neutro Abs: 9.4 10*3/uL — ABNORMAL HIGH (ref 1.7–7.7)
Neutrophils Relative %: 84 %
PLATELETS: 182 10*3/uL (ref 150–400)
RBC: 2.97 MIL/uL — AB (ref 4.22–5.81)
RDW: 16.7 % — ABNORMAL HIGH (ref 11.5–15.5)
WBC: 11.2 10*3/uL — AB (ref 4.0–10.5)

## 2016-08-04 LAB — POCT INR: INR: 6.6

## 2016-08-04 MED ORDER — TESTOSTERONE CYPIONATE 200 MG/ML IM SOLN
100.0000 mg | INTRAMUSCULAR | Status: DC
  Administered 2016-08-04: 100 mg via INTRAMUSCULAR
  Filled 2016-08-04: qty 1

## 2016-08-04 NOTE — Progress Notes (Signed)
Jeremy [name] presents today for injection per MD orders. Testosterone 100 mg administered IM in left upper outer Gluteal. Administration without incident. Patient tolerated well. Stable and ambulatory on discharge home to self.

## 2016-08-04 NOTE — Assessment & Plan Note (Addendum)
Hypogonadism requiring IM testosterone replacement therapy monthly.  Labs today: Testosterone.  I personally reviewed and went over laboratory results with the patient.  The results are noted within this dictation.  Labs in 3 mo: Testosterone  Supportive therapy plan Is reviewed.  Continue with testosterone monthly.  This is a prescribed medication and will require drug management and ongoing monitoring for side effects and therapeutic levels.  Testosterone levels have been declining but remain within normal limits most recently.  Will adjust supportive therapy plan as indicated.  Return in 3 months for ongoing follow-up.

## 2016-08-04 NOTE — Assessment & Plan Note (Addendum)
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  Oncology history developed.  I personally reviewed and went over radiographic studies with the patient.  The results are noted within this dictation.  I personally reviewed the images in PACS.  CT CAP on 07/23/2016 is reviewed.

## 2016-08-04 NOTE — Patient Instructions (Signed)
[place] at [facility] Discharge Instructions  RECOMMENDATIONS MADE BY THE CONSULTANT AND ANY TEST RESULTS WILL BE SENT TO YOUR REFERRING PHYSICIAN.  Testosterone injection given as ordered.  Thank you for choosing [place] at [facility] to provide your oncology and hematology care.  To afford each patient quality time with our provider, please arrive at least 15 minutes before your scheduled appointment time.    If you have a lab appointment with the [facility] please come in thru the  Main Entrance and check in at the main information desk  You need to re-schedule your appointment should you arrive 10 or more minutes late.  We strive to give you quality time with our providers, and arriving late affects you and other patients whose appointments are after yours.  Also, if you no show three or more times for appointments you may be dismissed from the clinic at the providers discretion.     Again, thank you for choosing [facility].  Our hope is that these requests will decrease the amount of time that you wait before being seen by our physicians.       _____________________________________________________________  Should you have questions after your visit to [facility], please contact our office at [phone] between the hours of 8:30 a.m. and 4:30 p.m.  Voicemails left after 4:30 p.m. will not be returned until the following business day.  For prescription refill requests, have your pharmacy contact our office.       Resources For Cancer Patients and their Caregivers ? American Cancer Society: Can assist with transportation, wigs, general needs, runs Look Good Feel Better.        301-869-3779 ? Cancer Care: Provides financial assistance, online support groups, medication/co-pay assistance.  1-800-813-HOPE (828) 794-9749) ? [place] Assists [place] cancer patients and their  families through emotional , educational and financial support.  518-745-9291 ? [place] DSS Where to apply for food stamps, Medicaid and utility assistance. 380 585 5050 ? RCATS: Transportation to medical appointments. 254-292-9558 ? Social Security Administration: May apply for disability if have a Stage IV cancer. 959-249-3096 651 555 1963 ? [employer], Disability and Transit Services: Assists with nutrition, care and transit needs. [place] Support Programs: @10RELATIVEDAYS @ > Cancer Support Group  2nd Tuesday of the month 1pm-2pm, Journey Room  > Creative Journey  3rd Tuesday of the month 1130am-1pm, Journey Room  > Look Good Feel Better  1st Wednesday of the month 10am-12 noon, Journey Room (Call [place] to register [phone])

## 2016-08-04 NOTE — Patient Instructions (Addendum)
[place] at [facility] Discharge Instructions  RECOMMENDATIONS MADE BY THE CONSULTANT AND ANY TEST RESULTS WILL BE SENT TO YOUR REFERRING PHYSICIAN.  You were seen today by Kirby Crigler PA-C. Labs today, we will call you with those results. Testosterone injection today and monthly. Labs and follow up in 3 months.   Thank you for choosing [place] at [facility] to provide your oncology and hematology care.  To afford each patient quality time with our provider, please arrive at least 15 minutes before your scheduled appointment time.    If you have a lab appointment with the [facility] please come in thru the  Main Entrance and check in at the main information desk  You need to re-schedule your appointment should you arrive 10 or more minutes late.  We strive to give you quality time with our providers, and arriving late affects you and other patients whose appointments are after yours.  Also, if you no show three or more times for appointments you may be dismissed from the clinic at the providers discretion.     Again, thank you for choosing [facility].  Our hope is that these requests will decrease the amount of time that you wait before being seen by our physicians.       _____________________________________________________________  Should you have questions after your visit to [facility], please contact our office at [phone] between the hours of 8:30 a.m. and 4:30 p.m.  Voicemails left after 4:30 p.m. will not be returned until the following business day.  For prescription refill requests, have your pharmacy contact our office.       Resources For Cancer Patients and their Caregivers ? American Cancer Society: Can assist with transportation, wigs, general needs, runs Look Good Feel Better.        (272)865-1587 ? Cancer Care: Provides financial assistance, online support groups,  medication/co-pay assistance.  1-800-813-HOPE (616)387-8559) ? [employer] Assists [place] cancer patients and their families through emotional , educational and financial support.  870-353-6527 ? [place] DSS Where to apply for food stamps, Medicaid and utility assistance. 212-277-1751 ? RCATS: Transportation to medical appointments. 445-610-3791 ? Social Security Administration: May apply for disability if have a Stage IV cancer. (912)785-4671 424-211-2326 ? [employer], Disability and Transit Services: Assists with nutrition, care and transit needs. [place] Support Programs: @10RELATIVEDAYS @ > Cancer Support Group  2nd Tuesday of the month 1pm-2pm, Journey Room  > Creative Journey  3rd Tuesday of the month 1130am-1pm, Journey Room  > Look Good Feel Better  1st Wednesday of the month 10am-12 noon, Journey Room (Call [place] to register [phone])

## 2016-08-04 NOTE — Assessment & Plan Note (Addendum)
Anemia in the setting of chronic renal disease, ESRD requiring hemodialysis.  On ESA therapy managed by nephrology.  Other contributing factor to anemia may be his mechanical aortic valve resulting in increased turbulence and lysis of RBC.  Labs today: CBC diff, CMET.  I personally reviewed and went over laboratory results with the patient.  The results are noted within this dictation.  Labs in 3 months: CBC diff, BMET.  Recent hospitalization noted from 4/12- 07/22/2016 for febrile illness, suspected to be from subclinical rejection of renal transplant and negative infectious work-up.  He followed up with [facility] transplant team on 07/31/2016.  CMV and EBV have been sent off.  Patient is now on a slow taper of Prednisone:  40 mg PO x 7 days  30 mg PO x 7 days  20 mg PO x 7 days  10 mg PO x 7 days  5 mg PO x 60 days, then stop  Return in 3 months for follow-up.

## 2016-08-04 NOTE — Progress Notes (Signed)
Jeremy File, MD [place] [address]  Anemia in chronic kidney disease, on chronic dialysis [facility])  Hypogonadism in male  CURRENT THERAPY: ESA therapy by nephrology.  Depo-Testosterone every 4 weeks  INTERVAL HISTORY: Jeremy [name] 61 y.o. male returns for followup of Anemia in the setting of ESRD requiring HD. AND Hypogonadism, on depo-testosterone therapy. 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  Moderate arterionephrosclerosis with focal features of accelerated HTN related injury, moderate diabetic nephropathy, recurrent IgA nephropathy. AND ESRD, on hemodialysis. AND Splenomegaly on CT imaging on 07/19/2016.    Renal cell carcinoma ([place])   07/23/2016 Imaging    CT CAP- Chest Impression:  1. No evidence of pulmonary infection. 2. Coronary artery calcification and aortic atherosclerotic calcification.  Abdomen / Pelvis Impression:  1. 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 No evidence of frank renal abscess. 2. Renal arteries are opacified. No excretion of IV contrast identified. 3. No abscess in the abdomen pelvis. 4. Mild splenomegaly 5. Renal osteodystrophy.        HPI Elements Anemia  Location: Peripheral blood  Quality:   Severity: Severe, requiring ESA therapy.  Below baseline.  Duration: ESA therapy beginning on 07/11/2015  Context: In the setting of ESRD, on hemodialysis  Timing:   Modifying Factors: S/P renal transplant  Associated Signs & Symptoms: Fatigue, tiredness, tachycardia with exertion    HPI Elements Hypogonadism  Location:   Quality:  Testosterone levels are declining, but still WNL.  Below baseline.  Severity: Severe, requiring testosterone replacement therapy  Duration: Testosterone replacement therapy beginning on 08/02/2015  Context:   Timing:   Modifying Factors: Anemia  Associated Signs & Symptoms: Fatigue    HPI Elements RCC  Location: Bilateral kidneys  Quality:   Severity: In Remission  Duration:   Context: Renal transplant  Timing:   Modifying Factors:   Associated Signs & Symptoms:     He has seen his transplant team at [facility] regarding his kidneys.  He is on a tapering dose of prednisone.  He is concerned about his decline in his testosterone level will continue to monitor and changes were therapy plan as indicated.  He is on hemodialysis on Tuesdays, Thursdays, and Saturdays.  He is considering transitioning to peritoneal dialysis in the future.  His nephrologist is managing his ESA therapy.  Review of Systems  Constitutional: Positive for malaise/fatigue. Negative for chills, fever and weight loss.  HENT: Negative.   Eyes: Negative.   Respiratory: Positive for shortness of breath (on exertion). Negative for cough.   Cardiovascular: Negative.  Negative for chest pain.  Gastrointestinal: Negative.  Negative for blood in stool, constipation, diarrhea, melena, nausea and vomiting.  Genitourinary: Negative.   Musculoskeletal: Negative.   Skin: Negative.   Neurological: Negative.  Negative for weakness.  Endo/Heme/Allergies: Negative.   Psychiatric/Behavioral: Negative.     Past Medical History:  Diagnosis Date  . Anemia   . 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 Arthritis   . Blood transfusion without reported diagnosis   . Cancer ([place])   . Chronic anticoagulation 06/2012  . Dialysis patient [facility]) 01 01 2008  .  Hx of colonoscopy    2009 by Dr. Laural Golden. Normal except for small submucosal lipoma. No evidence of polyps or colitis.  . Hyperlipidemia   .  Hypertension   . Hypogonadism in male 07/25/2015  . Kidney disease    Chronic kidney disease secondary to IgA nephropathy diagnosed when he was 69. 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  Marland Kitchen OSA (obstructive sleep apnea)     Past Surgical History:  Procedure Laterality Date  . AORTIC VALVE REPLACEMENT  3 /11 /2014   At [place]    . CORONARY ANGIOPLASTY WITH STENT PLACEMENT    . DG AV DIALYSIS  SHUNT ACCESS EXIST*L* OR     left arm  . KIDNEY TRANSPLANT    . TEE WITHOUT CARDIOVERSION N/A 07/21/2016   Procedure: TRANSESOPHAGEAL ECHOCARDIOGRAM (TEE);  Surgeon: Wellington Hampshire, MD;  Location: [facility] ORS;  Service: Cardiovascular;  Laterality: N/A;    Family History  Problem Relation Age of Onset  . Cancer Mother     pancreatic  . Heart disease Father   . Diabetes Father     Social History   Social History  . Marital status: Divorced    Spouse name: N/A  . Number of children: N/A  . Years of education: N/A   Occupational History  . courier     [employer]   Social History Main Topics  . Smoking status: Never Smoker  . Smokeless tobacco: Never Used  . Alcohol use No  . Drug use: No  . Sexual activity: Not on [name]   Other Topics Concern  . Not on [name]   Social History Narrative   Single   1 daughter 61      PHYSICAL EXAMINATION  ECOG PERFORMANCE STATUS: 1 - Symptomatic but completely ambulatory  Vitals:   08/04/16 0926  BP: (!) 157/68  Pulse: 65  Resp: 16  Temp: 98 F (36.7 C)    GENERAL:alert, no distress, well nourished, well developed, comfortable, cooperative, smiling and unaccompanied SKIN: skin color, texture, turgor are normal, no rashes or significant lesions HEAD: Normocephalic, No masses, lesions, tenderness or abnormalities EYES: normal, EOMI, Conjunctiva are pink and non-injected EARS: External ears normal, R TM  with patch, L TM  shiny and non-erythematous OROPHARYNX:lips, buccal mucosa, and tongue normal and mucous membranes are moist  NECK: supple, no adenopathy, trachea midline LYMPH:  no palpable lymphadenopathy BREAST:not examined LUNGS: clear to auscultation  HEART: regular rate & rhythm and click heart best at LSB 5th ICS ABDOMEN:abdomen soft, non-tender and normal bowel sounds BACK: Back symmetric, no curvature. EXTREMITIES:less then 2 second capillary refill, no joint deformities, effusion, or inflammation, no cyanosis  NEURO: alert & oriented x 3 with fluent speech, no focal motor/sensory deficits, gait normal   LABORATORY DATA: CBC    Component Value Date/Time   WBC 11.2 (H) 08/04/2016 0831   RBC 2.97 (L) 08/04/2016 0831   HGB 9.9 (L) 08/04/2016 0831   HCT 31.3 (L) 08/04/2016 0831   HCT 32.2 (L) 08/30/2014 1013   PLT 182 08/04/2016 0831   PLT 125 (L) 08/30/2014 1013   MCV 105.4 (H) 08/04/2016 0831   MCV 88.2 09/16/2014 1041   MCV 89 08/30/2014 1013   MCH 33.3 08/04/2016 0831   MCHC 31.6 08/04/2016 0831   RDW 16.7 (H) 08/04/2016 0831   RDW 14.0 08/30/2014 1013   LYMPHSABS 1.5 08/04/2016 0831  LYMPHSABS 0.8 08/30/2014 1013   MONOABS 0.3 08/04/2016 0831   EOSABS 0.0 08/04/2016 0831   EOSABS 0.1 08/30/2014 1013   BASOSABS 0.0 08/04/2016 0831   BASOSABS 0.0 08/30/2014 1013      Chemistry      Component Value Date/Time   NA 137 08/04/2016 0831   NA 139 11/28/2011   K 4.5 08/04/2016 0831   CL 99 (L) 08/04/2016 0831   CO2 23 08/04/2016 0831   BUN 77 (H) 08/04/2016 0831   BUN 35 (A) 12/06/2011   CREATININE 9.36 (H) 08/04/2016 0831   GLU 85 12/06/2011      Component Value Date/Time   CALCIUM 8.5 (L) 08/04/2016 0831   CALCIUM 9.2 12/14/2015 1059   ALKPHOS 168 (H) 08/04/2016 0831   AST 24 08/04/2016 0831   ALT 28 08/04/2016 0831   BILITOT 0.5 08/04/2016 0831     Lab Results  Component Value Date   TESTOSTERONE 300 07/07/2016    PENDING  LABS:   RADIOGRAPHIC STUDIES:  Dg Chest 2 View  Result Date: 07/17/2016 CLINICAL DATA:  Fever. EXAM: CHEST  2 VIEW COMPARISON:  Radiographs of January 06, 2008. FINDINGS: Stable cardiomediastinal silhouette. Status post aortic valve repair. Stable mild central pulmonary vascular congestion is noted. No pneumothorax or pleural effusion is noted. No acute pulmonary disease is noted. Old right rib fracture is noted. IMPRESSION: Stable central pulmonary vascular congestion. No other significant abnormality seen. Electronically Signed   By: Marijo Conception, M.D.   On: 07/17/2016 16:25   Ct Chest W Contrast  Result Date: 07/19/2016 CLINICAL DATA:  61 y.o. male admitted with 2 weeks of fevers to 101.3. He has a history of end-stage renal disease on dialysis as well as aortic valve replacement. Chest x-ray showed no pneumonia. Pt with ESRD on HD, with a nonfunctioning transplant kidney recently stopped off immunosuppressives now with 2 weeks of fevers. Eval for transplant rejection, PNA or other source of fever EXAM: CT CHEST, ABDOMEN, AND PELVIS WITH CONTRAST TECHNIQUE: Multidetector CT imaging of the chest, abdomen and pelvis was performed following the standard protocol during bolus administration of intravenous contrast. CONTRAST:  167mL ISOVUE-300 IOPAMIDOL (ISOVUE-300) INJECTION 61% COMPARISON:  Chest radiograph 412 18. FINDINGS: CT CHEST FINDINGS Cardiovascular: Coronary artery calcification and aortic atherosclerotic calcification. Mediastinum/Nodes: No axillary supraclavicular adenopathy. No mediastinal hilar adenopathy. No pericardial fluid. Esophagus normal. Lungs/Pleura: No airspace disease. No pleural fluid. No suspicious nodularity. Airways are normal. Musculoskeletal: No aggressive osseous lesion. CT ABDOMEN AND PELVIS FINDINGS Hepatobiliary: No focal hepatic lesion. Several small gallstones in the gallbladder. No gallbladder inflammation. Pancreas: Pancreas is normal. No ductal dilatation. No  pancreatic inflammation. Spleen: Spleen measures 15.0 x 6.4 x 10.7 cm (volume = 540 cm^3). Mildly enlarged. Adrenals/urinary tract: Adrenal glands are normal. No native kidneys identified The transplant kidney in the RIGHT iliac fossa appears edematous. Kidney measures 12.6 cm in length. There is stranding within the fat adjacent to the kidney. No evidence of frank abscess or striated appearance. The transplant renal artery opacifies. Delayed imaging demonstrates no significant excretion of IV contrast. Bladder is collapsed Stomach/Bowel: Stomach, small-bowel and cecum are normal. No evidence bowel dilatation or obstruction. Contrast travels the length of the small bowel and colon to the splenic flexure. Rectosigmoid colon normal. Vascular/Lymphatic: Abdominal aorta is normal caliber with atherosclerotic calcification. There is no retroperitoneal or periportal lymphadenopathy. No pelvic lymphadenopathy. Reproductive: Prostate normal Other: No significant free fluid.  No abscess. Musculoskeletal: Diffuse sclerosis of the bones consistent renal osteodystrophy IMPRESSION:  Chest Impression: 1. No evidence of pulmonary infection. 2. Coronary artery calcification and aortic atherosclerotic calcification. Abdomen / Pelvis Impression: 1. 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 No evidence of frank renal abscess. 2. Renal arteries are opacified. No excretion of IV contrast identified. 3. No abscess in the abdomen pelvis. 4. Mild splenomegaly 5. Renal osteodystrophy. Electronically Signed   By: Suzy Bouchard M.D.   On: 07/19/2016 07:22   Ct Abdomen Pelvis W Contrast  Result Date: 07/19/2016 CLINICAL DATA:  61 y.o. male admitted with 2 weeks of fevers to 101.3. He has a history of end-stage renal disease on dialysis as well as aortic valve replacement. Chest x-ray showed no pneumonia. Pt with ESRD on HD, with a nonfunctioning transplant kidney recently stopped off  immunosuppressives now with 2 weeks of fevers. Eval for transplant rejection, PNA or other source of fever EXAM: CT CHEST, ABDOMEN, AND PELVIS WITH CONTRAST TECHNIQUE: Multidetector CT imaging of the chest, abdomen and pelvis was performed following the standard protocol during bolus administration of intravenous contrast. CONTRAST:  169mL ISOVUE-300 IOPAMIDOL (ISOVUE-300) INJECTION 61% COMPARISON:  Chest radiograph 412 18. FINDINGS: CT CHEST FINDINGS Cardiovascular: Coronary artery calcification and aortic atherosclerotic calcification. Mediastinum/Nodes: No axillary supraclavicular adenopathy. No mediastinal hilar adenopathy. No pericardial fluid. Esophagus normal. Lungs/Pleura: No airspace disease. No pleural fluid. No suspicious nodularity. Airways are normal. Musculoskeletal: No aggressive osseous lesion. CT ABDOMEN AND PELVIS FINDINGS Hepatobiliary: No focal hepatic lesion. Several small gallstones in the gallbladder. No gallbladder inflammation. Pancreas: Pancreas is normal. No ductal dilatation. No pancreatic inflammation. Spleen: Spleen measures 15.0 x 6.4 x 10.7 cm (volume = 540 cm^3). Mildly enlarged. Adrenals/urinary tract: Adrenal glands are normal. No native kidneys identified The transplant kidney in the RIGHT iliac fossa appears edematous. Kidney measures 12.6 cm in length. There is stranding within the fat adjacent to the kidney. No evidence of frank abscess or striated appearance. The transplant renal artery opacifies. Delayed imaging demonstrates no significant excretion of IV contrast. Bladder is collapsed Stomach/Bowel: Stomach, small-bowel and cecum are normal. No evidence bowel dilatation or obstruction. Contrast travels the length of the small bowel and colon to the splenic flexure. Rectosigmoid colon normal. Vascular/Lymphatic: Abdominal aorta is normal caliber with atherosclerotic calcification. There is no retroperitoneal or periportal lymphadenopathy. No pelvic lymphadenopathy.  Reproductive: Prostate normal Other: No significant free fluid.  No abscess. 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 No evidence of pulmonary infection. 2. Coronary artery calcification and aortic atherosclerotic calcification. Abdomen / Pelvis Impression: 1. 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 No evidence of frank renal abscess. 2. Renal arteries are opacified. No excretion of IV contrast identified. 3. No abscess in the abdomen pelvis. 4. Mild splenomegaly 5. Renal osteodystrophy. Electronically Signed   By: Suzy Bouchard M.D.   On: 07/19/2016 07:22     PATHOLOGY:    ASSESSMENT AND PLAN:  Anemia in chronic kidney disease Anemia in the setting of chronic renal disease, ESRD requiring hemodialysis.  On ESA therapy managed by nephrology.  Other contributing factor to anemia may be his mechanical aortic valve resulting in increased turbulence and lysis of RBC.  Labs today: CBC diff, CMET.  I personally reviewed and went over laboratory results with the patient.  The results are noted within this dictation.  Labs in 2 months: CBC diff, BMET.  Recent hospitalization noted from 4/12- 07/22/2016 for febrile illness, suspected to be from subclinical rejection  of renal transplant and negative infectious work-up.  He followed up with [facility] transplant team on 07/31/2016.  CMV and EBV have been sent off.  Patient is now on a slow taper of Prednisone:  40 mg PO x 7 days  30 mg PO x 7 days  20 mg PO x 7 days  10 mg PO x 7 days  5 mg PO x 60 days, then stop      Hypogonadism in male Hypogonadism requiring IM testosterone replacement therapy monthly.  Labs today: Testosterone.  Supportive therapy plan Is reviewed.  Continue with testosterone monthly.  This is a prescribed medication and will require drug management and ongoing monitoring for side effects and therapeutic  levels.  Return in 2-3 months for ongoing follow-up.   Number of Diagnoses or Treatment Options- Section A:      Problems to Exam Physician Problem(s) Number x Points= Results  Self-limited or minor (stable, improved, or worsening)  Max=2  1   Est. Problem (to examiner); stable, improved   1 2  Est. Problem (to examiner); worsening   2 2  New problem (to examiner); no additional work-up planned  Max=1 3   New problem (to examiner); add. work-up planned   4      Total: 4    Amount and/or Complexity of Data to be Reviewed- Section B    Data to be reviewed: Points    Review and/or order of clinical lab tests 1  [x]    Review and/or order of tests in the radiology section of CPT (includes nuclear med & other except cardiac cath & ECG) 1  []    Review and/or order of tests in the medicine section of CPT (e.g. EKG, cardiac cath, non-invasive vascular studies, pulmonary function studies) 1  []    Discussion of test results with performing physician 1  [x]    Decision to obtain old records and/or obtaining history from someone other than patient 1  []    Review and summarization of old records and/or obtaining history from someone other than patient and/or discussion of case with another health care provider 2  [x]    Independent visualization of image, tracing, or specimen itself (not simply review report) 2  [x]    Total:  6    Risk of  complications and/or Morbidity or Mortality- Section C  Level of Risk: Presenting Problem(s) Diagnostic Procedure(s) Ordered Management Options Selected  Minimal One self-limited or minor problem (eg cold, insect bite, tinea corporis)  Lab test requiring venipuncture  Chest xray  EKG/EEG  [name] or Echo  KOH prep  Urinalysis  Rest  Gargles  Elastic bandages  Superficial dressings  Low  Two or more self-limited or minor problems  One stable chronic illness (well-controlled HTN, non-insulin dependent diabetes, cataract, BPH)  Acute uncomplicated  illness or injury (cystitis, allergic rhinitis, simple sprain)  Physiologic test not under stress (pulm fnx tests)  Non-cardiovascular imaging studies with contrast (barium enema)  Superficial needle biopsy  Clinical laboratory tests requiring arterial puncture  Skin biopsies  OTC drugs  Minor surgery with no identified risk factors  Physical therapy  Occupational therapy  IV fluids without additives  Moderate  One or more chronic illnesses with mild exacerbation, progression, or side effects of treatment  Two or more stable chronic illnesses  Undiagnosed new problem with uncertain prognosis (lump in breast)  Acute illness with systemic symptoms (pyelonephritis, pneumonitis, colitis)  Acute complicated injury (head injury with brief loss of consciousness)  Physiologic test under stress (cardiac  stress test, fetal contraction stress test)  Diagnostic endoscopies with no identified risk factors  Deep needle or incisional biopsy  Cardiovascular imaging studies with contrast and no identified risk factors (arteriogram, cardiac cath)  Obtain fluid from body cavity (LP, thoracentesis, culdocentesis)  Minor surgery with identified risk factors  Elective surgery (open, percutaneous, or endoscopic) with no identified risk factors  Prescription drug management  Therapeutic nuclear medicine  IV fluids with additives  Closed treatment of fracture of dislocation without manipulation  High  One or more chronic illnesses with severe exacerbation, progression, or side effects of treatment.  Acute or chronic illnesses or injuries that may pose a threat to life or bodily function (multiple trauma, acute MI, PE, severe respiratory distress, progressive severe rheumatoid arthritis, psychiatric illness with potential threat to self or others, peritonitis, acute renal failure)  An abrupt change in neurological status (seizure, TIA, weakness, sensory loss)  Cardiovascular imaging  studies with contrast with identified risk factors  Cardiac electrophysiological tests  Diagnostic endoscopies with identified risk factors  Discography   Elective major surgery (open, percutaneous, or endoscopic) with identified risk factor  Emergency major surgery (open, percutaneous, or endoscopic)  Parental controlled substances  Drug therapy requiring intensive monitoring for toxicity  Decision not to resuscitate or deescalate care because of poor prognosis     Final Result of Complexity      Choose decision making level with 2 or 3 checks OR choose the decision making level on Section B       A Number of diagnoses or treatment options  []   </= 1 Minimal  []   2 Limited  [x]   3 Multiple  []   >/= 4 Extensive  B Amount and complexity of data  []   </= 1 Minimal or low  []   2 Limited  []   3  Moderate  [x]   >/= 4 Extensive  C Highest risk  []   Minimal  []   Low  [x]   Moderate  []   High   Type of decision making  []   Straight-forward  []   Low Complexity  [x]   Moderate- Complexity  []   High- Complexity     ORDERS PLACED FOR THIS ENCOUNTER: No orders of the defined types were placed in this encounter.   MEDICATIONS PRESCRIBED THIS ENCOUNTER: No orders of the defined types were placed in this encounter.   THERAPY PLAN:  Continue with Testosterone replacement therapy and will defer ESA therapy to nephrology.  Will adjust dosing of testosterone replacement therapy based upon laboratory results.  All questions were answered. The patient knows to call the clinic with any problems, questions or concerns. We can certainly see the patient much sooner if necessary.  Patient and plan discussed with Dr. Twana First and she is in agreement with the aforementioned.   This note is electronically signed by: Doy Mince 08/04/2016 9:33 AM

## 2016-08-05 ENCOUNTER — Ambulatory Visit (INDEPENDENT_AMBULATORY_CARE_PROVIDER_SITE_OTHER): Payer: Self-pay | Admitting: Pharmacist

## 2016-08-05 ENCOUNTER — Other Ambulatory Visit (HOSPITAL_COMMUNITY): Payer: Self-pay | Admitting: Oncology

## 2016-08-05 DIAGNOSIS — Z952 Presence of prosthetic heart valve: Secondary | ICD-10-CM

## 2016-08-05 DIAGNOSIS — N2581 Secondary hyperparathyroidism of renal origin: Secondary | ICD-10-CM | POA: Diagnosis not present

## 2016-08-05 DIAGNOSIS — E291 Testicular hypofunction: Secondary | ICD-10-CM

## 2016-08-05 DIAGNOSIS — Z992 Dependence on renal dialysis: Secondary | ICD-10-CM | POA: Diagnosis not present

## 2016-08-05 DIAGNOSIS — Z7901 Long term (current) use of anticoagulants: Secondary | ICD-10-CM

## 2016-08-05 DIAGNOSIS — N186 End stage renal disease: Secondary | ICD-10-CM | POA: Diagnosis not present

## 2016-08-05 DIAGNOSIS — D631 Anemia in chronic kidney disease: Secondary | ICD-10-CM | POA: Diagnosis not present

## 2016-08-05 DIAGNOSIS — Z5181 Encounter for therapeutic drug level monitoring: Secondary | ICD-10-CM

## 2016-08-05 LAB — ERYTHROPOIETIN: ERYTHROPOIETIN: 281.8 m[IU]/mL — AB (ref 2.6–18.5)

## 2016-08-05 LAB — TESTOSTERONE: Testosterone: 162 ng/dL — ABNORMAL LOW (ref 264–916)

## 2016-08-05 NOTE — Telephone Encounter (Signed)
Spoke with patient about elevated INR and instructed him to hold Coumadin tomorrow as he is are taken today's dose. His INR was 6.6 which was called in by MDI to me. Patient will recheck INR on 08/06/2016 and will hold Coumadin if still elevated. Patient is on prednisone and is the likely reason because of it. Caryl Pina, MD [facility] 08/05/2016, 7:55 AM

## 2016-08-06 LAB — POCT INR: INR: 3.9

## 2016-08-07 ENCOUNTER — Ambulatory Visit (INDEPENDENT_AMBULATORY_CARE_PROVIDER_SITE_OTHER): Payer: 59 | Admitting: Pharmacist

## 2016-08-07 DIAGNOSIS — Z992 Dependence on renal dialysis: Secondary | ICD-10-CM | POA: Diagnosis not present

## 2016-08-07 DIAGNOSIS — Z7901 Long term (current) use of anticoagulants: Secondary | ICD-10-CM

## 2016-08-07 DIAGNOSIS — N186 End stage renal disease: Secondary | ICD-10-CM | POA: Diagnosis not present

## 2016-08-07 DIAGNOSIS — N2581 Secondary hyperparathyroidism of renal origin: Secondary | ICD-10-CM | POA: Diagnosis not present

## 2016-08-07 DIAGNOSIS — Z952 Presence of prosthetic heart valve: Secondary | ICD-10-CM | POA: Diagnosis not present

## 2016-08-07 DIAGNOSIS — Z5181 Encounter for therapeutic drug level monitoring: Secondary | ICD-10-CM

## 2016-08-07 DIAGNOSIS — D631 Anemia in chronic kidney disease: Secondary | ICD-10-CM | POA: Diagnosis not present

## 2016-08-07 NOTE — Consult Note (Signed)
Subjective:     Indication: presence of prosthetic heart valve Bleeding signs/symptoms: None Thromboembolic signs/symptoms: None  Missed Coumadin doses: This week - missed 2 doses due to INR of 6.6 on 08/04/16 Medication changes: yes - prednisone started about 1 month ago Dietary changes: no Bacterial/viral infection: no Other concerns: yes - patient receiving dialysis TIW   Objective:    INR Today: 3.9 (down from 6.6,  2 days ago)  Assessment:    Supratherapeutic INR for goal of 2-3   Plan:    1. New dose: hold warfarin for 1 dose - then decrease to 1mg  MWF and 2mg  all other days.   2. Next INR: 1 week   Patient checks INR at home with Home INR monitoring.  Billing once per month interupertation fee.  Patient diagnosis - venous thromboembolism.  Procedure code if [id number]

## 2016-08-08 ENCOUNTER — Encounter: Payer: Self-pay | Admitting: Family Medicine

## 2016-08-08 ENCOUNTER — Ambulatory Visit (INDEPENDENT_AMBULATORY_CARE_PROVIDER_SITE_OTHER): Payer: Medicare Other | Admitting: Family Medicine

## 2016-08-08 VITALS — BP 164/70 | HR 67 | Temp 98.1°F | Ht 70.0 in | Wt 189.1 lb

## 2016-08-08 DIAGNOSIS — E291 Testicular hypofunction: Secondary | ICD-10-CM

## 2016-08-08 DIAGNOSIS — D229 Melanocytic nevi, unspecified: Secondary | ICD-10-CM

## 2016-08-08 DIAGNOSIS — L821 Other seborrheic keratosis: Secondary | ICD-10-CM

## 2016-08-08 DIAGNOSIS — I10 Essential (primary) hypertension: Secondary | ICD-10-CM | POA: Diagnosis not present

## 2016-08-08 DIAGNOSIS — G4733 Obstructive sleep apnea (adult) (pediatric): Secondary | ICD-10-CM

## 2016-08-08 DIAGNOSIS — R739 Hyperglycemia, unspecified: Secondary | ICD-10-CM

## 2016-08-08 DIAGNOSIS — Z992 Dependence on renal dialysis: Secondary | ICD-10-CM | POA: Diagnosis not present

## 2016-08-08 DIAGNOSIS — N186 End stage renal disease: Secondary | ICD-10-CM

## 2016-08-08 DIAGNOSIS — I2510 Atherosclerotic heart disease of native coronary artery without angina pectoris: Secondary | ICD-10-CM | POA: Diagnosis not present

## 2016-08-08 DIAGNOSIS — E785 Hyperlipidemia, unspecified: Secondary | ICD-10-CM

## 2016-08-08 LAB — LIPID PANEL
CHOL/HDL RATIO: 3
Cholesterol: 176 mg/dL (ref 0–200)
HDL: 50.5 mg/dL (ref >39.00)
LDL CALC: 94 mg/dL (ref 0–99)
NONHDL: 125.77
Triglycerides: 158 mg/dL — ABNORMAL HIGH (ref 0.0–149.0)
VLDL: 31.6 mg/dL (ref 0.0–40.0)

## 2016-08-08 LAB — HEMOGLOBIN A1C: HEMOGLOBIN A1C: 5.2 % (ref 4.6–6.5)

## 2016-08-08 MED ORDER — ROSUVASTATIN CALCIUM 10 MG PO TABS: 10.0000 mg | ORAL_TABLET | Freq: Every day | ORAL | 3 refills | Status: DC

## 2016-08-08 NOTE — Patient Instructions (Signed)
We will call with your referral.  Follow up in 6 months.  Take care  Dr. Lacinda Axon

## 2016-08-08 NOTE — Progress Notes (Signed)
Pre visit review using our clinic review tool, if applicable. No additional management support is needed unless otherwise documented below in the visit note. 

## 2016-08-09 DIAGNOSIS — R739 Hyperglycemia, unspecified: Secondary | ICD-10-CM | POA: Insufficient documentation

## 2016-08-09 DIAGNOSIS — N186 End stage renal disease: Secondary | ICD-10-CM | POA: Diagnosis not present

## 2016-08-09 DIAGNOSIS — Z992 Dependence on renal dialysis: Secondary | ICD-10-CM | POA: Diagnosis not present

## 2016-08-09 DIAGNOSIS — D631 Anemia in chronic kidney disease: Secondary | ICD-10-CM | POA: Diagnosis not present

## 2016-08-09 DIAGNOSIS — N2581 Secondary hyperparathyroidism of renal origin: Secondary | ICD-10-CM | POA: Diagnosis not present

## 2016-08-09 NOTE — Assessment & Plan Note (Signed)
Lipid panel today. Starting on Crestor.

## 2016-08-09 NOTE — Assessment & Plan Note (Signed)
Continue follow up with Heme/Onc.

## 2016-08-09 NOTE — Assessment & Plan Note (Signed)
Stable. Needs continued follow up with Nephrology and Transplant. Continue current meds.

## 2016-08-09 NOTE — Assessment & Plan Note (Signed)
A1C today. 

## 2016-08-09 NOTE — Assessment & Plan Note (Signed)
Patient to forward INR readings so we can manage.

## 2016-08-09 NOTE — Assessment & Plan Note (Signed)
Stable on CPAP 

## 2016-08-09 NOTE — Assessment & Plan Note (Signed)
He symptomatically currently. Continue carvedilol, warfarin.

## 2016-08-09 NOTE — Assessment & Plan Note (Signed)
Uncontrolled currently. Defer to nephrology to prevent lows from HD.

## 2016-08-09 NOTE — Progress Notes (Signed)
Subjective:  Patient ID: Jeremy [name], male    DOB: Aug 22, 1955  Age: 61 y.o. MRN: [id number]  CC: Establish care  HPI Jeremy [name] is a 61 y.o. male presents to the clinic today to establish care. Issues are below.  CKD on HD  Patient is followed by nephrology and transplant.  He currently does Monday, Wednesday, Friday hemodialysis.  Has a history of transplant 2. 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 His current transplant has failed and as a result he is on chronic HD.  Hypogonadism and anemia   Followed by hematology oncology.  Stable at this time.  Coronary artery disease, status post AVR  Patient has diffuse, mild to moderate nonobstructive coronary artery disease.  Additionally, he has a history of aortic stenosis and is status post aortic valve replacement. He is on chronic anticoagulation with Coumadin. He does home INR checks and sends readings in. We will need to coordinate this.  He is currently asymptomatic regarding his coronary artery disease.  Hypertension  Patient has been treated by nephrology.  BPs are difficult to control and are particularly elevated when he his days off dialysis.  He is currently on amlodipine, Coreg.  Elevated blood glucose  Patient most recent labs reveal elevated blood glucose.  There is no A1c available in the chart.  He needs this done today.  Hyperlipidemia  Patient statin has recently been stopped secondary to myalgias.  Patient needs lipid panel today.  He would like to discuss trying a different statin today.  OSA   Stable on CPAP.  PMH, Surgical Hx, Family Hx, Social History reviewed and updated as below.  Past Medical History:  Diagnosis Date  . 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 Arthritis   . Chronic anticoagulation 06/2012  . Dialysis patient [facility]) 01 01 2008  . Hx of colonoscopy    2009 by Dr. Laural Golden. Normal except for small  submucosal lipoma. No evidence of polyps or colitis.  . Hyperlipidemia   . Hypertension   . Hypogonadism in male 07/25/2015  . 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  Marland Kitchen OSA (obstructive sleep apnea)   . Renal cell carcinoma ([place]) 08/04/2016   Past Surgical History:  Procedure Laterality Date  . AORTIC VALVE REPLACEMENT  3 /11 /2014   At [place]    . CORONARY ANGIOPLASTY WITH STENT PLACEMENT    . DG AV DIALYSIS  SHUNT ACCESS EXIST*L* OR     left arm  . KIDNEY TRANSPLANT    . TEE WITHOUT CARDIOVERSION N/A 07/21/2016   Procedure: TRANSESOPHAGEAL ECHOCARDIOGRAM (TEE);  Surgeon: Wellington Hampshire, MD;  Location: [facility] ORS;  Service: Cardiovascular;  Laterality: N/A;   Family History  Problem Relation Age of Onset  . Cancer Mother     pancreatic  . Heart disease Father   . Diabetes Father    Social History  Substance Use Topics  . Smoking status: Never Smoker  . Smokeless tobacco: Never Used  . Alcohol use No   Review of Systems  Musculoskeletal: Positive for myalgias.  Skin:       Moles.  All other systems reviewed and are negative.  Objective:   Today's Vitals: BP (!) 164/70  Pulse 67   Temp 98.1 F (36.7 C) (Oral)   Ht 5\' 10"  (1.778 m)   Wt 189 lb 2 oz (85.8 kg)   SpO2 97%   BMI 27.14 kg/m   Physical Exam  Constitutional: He is oriented to person, place, and time. He appears well-developed. No distress.  HENT:  Head: Normocephalic and atraumatic.  Mouth/Throat: Oropharynx is clear and moist.  Eyes: Conjunctivae are normal. No scleral icterus.  Neck: Neck supple.  Cardiovascular: Normal rate and regular rhythm.   2/6 systolic murmur. Audible click appreciated from mechanical valve.  Pulmonary/Chest: Effort normal. He has no wheezes. He has no  rales.  Abdominal: Soft. He exhibits no distension. There is no tenderness. There is no rebound and no guarding.  Ventral hernia.   Musculoskeletal: Normal range of motion.  Lymphadenopathy:    He has no cervical adenopathy.  Neurological: He is alert and oriented to person, place, and time.  Skin:  Several SK's and nevi noted.  Psychiatric: He has a normal mood and affect.  Vitals reviewed.  Assessment & Plan:   Problem List Items Addressed This Visit    Blood glucose elevated    A1C today.      Relevant Orders   Hemoglobin A1c (Completed)   CAD (coronary artery disease)    He symptomatically currently. Continue carvedilol, warfarin.      Relevant Medications   amLODipine (NORVASC) 10 MG tablet   rosuvastatin (CRESTOR) 10 MG tablet   CKD (chronic kidney disease) stage V requiring chronic dialysis ([place]) - Primary    Stable. Needs continued follow up with Nephrology and Transplant. Continue current meds.      Essential hypertension    Uncontrolled currently. Defer to nephrology to prevent lows from HD.      Relevant Medications   amLODipine (NORVASC) 10 MG tablet   rosuvastatin (CRESTOR) 10 MG tablet   Hyperlipidemia    Lipid panel today. Starting on Crestor.       Relevant Medications   amLODipine (NORVASC) 10 MG tablet   rosuvastatin (CRESTOR) 10 MG tablet   Other Relevant Orders   Lipid panel (Completed)   Hypogonadism in male    Continue follow up with Heme/Onc.      OSA (obstructive sleep apnea)    Stable on CPAP.       Other Visit Diagnoses    Seborrheic keratoses       Relevant Orders   Ambulatory referral to Dermatology   Multiple nevi       Relevant Orders   Ambulatory referral to Dermatology     Meds ordered this encounter  Medications  . amLODipine (NORVASC) 10 MG tablet    Sig: Take 10 mg by mouth daily.  . predniSONE (DELTASONE) 10 MG tablet    Sig: Take 10 mg by mouth daily. 4 tabs daily (40 mg) x 7 days 3 tabs daily (30 mg) x 7  days 2 tabs daily (20 mg) x 7 days 1 tab daily (10 mg) x 7 days 1/2 tab daily (5 mg) x 60 days, then stop  . cinacalcet (SENSIPAR) 30 MG tablet    Sig: Take 30 mg by mouth every other day.  . rosuvastatin (CRESTOR) 10 MG tablet    Sig: Take 1 tablet (10 mg total) by mouth daily.    Dispense:  90 tablet    Refill:  3   Follow-up: 6 months  [place] DO [facility]

## 2016-08-11 ENCOUNTER — Encounter (HOSPITAL_COMMUNITY): Payer: Medicare Other | Attending: Hematology & Oncology

## 2016-08-11 ENCOUNTER — Encounter (HOSPITAL_COMMUNITY): Payer: Self-pay

## 2016-08-11 ENCOUNTER — Ambulatory Visit (HOSPITAL_COMMUNITY): Payer: Medicare Other | Admitting: Oncology

## 2016-08-11 ENCOUNTER — Ambulatory Visit (INDEPENDENT_AMBULATORY_CARE_PROVIDER_SITE_OTHER): Payer: Self-pay | Admitting: Pharmacist

## 2016-08-11 VITALS — BP 163/62 | HR 72 | Temp 98.1°F | Resp 18

## 2016-08-11 DIAGNOSIS — Z9889 Other specified postprocedural states: Secondary | ICD-10-CM | POA: Insufficient documentation

## 2016-08-11 DIAGNOSIS — Z85528 Personal history of other malignant neoplasm of kidney: Secondary | ICD-10-CM | POA: Insufficient documentation

## 2016-08-11 DIAGNOSIS — Z79899 Other long term (current) drug therapy: Secondary | ICD-10-CM | POA: Insufficient documentation

## 2016-08-11 DIAGNOSIS — N189 Chronic kidney disease, unspecified: Secondary | ICD-10-CM | POA: Insufficient documentation

## 2016-08-11 DIAGNOSIS — Z7901 Long term (current) use of anticoagulants: Secondary | ICD-10-CM

## 2016-08-11 DIAGNOSIS — E291 Testicular hypofunction: Secondary | ICD-10-CM | POA: Diagnosis present

## 2016-08-11 DIAGNOSIS — Z940 Kidney transplant status: Secondary | ICD-10-CM | POA: Insufficient documentation

## 2016-08-11 DIAGNOSIS — M1990 Unspecified osteoarthritis, unspecified site: Secondary | ICD-10-CM | POA: Insufficient documentation

## 2016-08-11 DIAGNOSIS — G4733 Obstructive sleep apnea (adult) (pediatric): Secondary | ICD-10-CM | POA: Insufficient documentation

## 2016-08-11 DIAGNOSIS — Z5181 Encounter for therapeutic drug level monitoring: Secondary | ICD-10-CM

## 2016-08-11 DIAGNOSIS — Z992 Dependence on renal dialysis: Secondary | ICD-10-CM | POA: Insufficient documentation

## 2016-08-11 DIAGNOSIS — I129 Hypertensive chronic kidney disease with stage 1 through stage 4 chronic kidney disease, or unspecified chronic kidney disease: Secondary | ICD-10-CM | POA: Insufficient documentation

## 2016-08-11 DIAGNOSIS — D649 Anemia, unspecified: Secondary | ICD-10-CM | POA: Insufficient documentation

## 2016-08-11 DIAGNOSIS — Z952 Presence of prosthetic heart valve: Secondary | ICD-10-CM

## 2016-08-11 DIAGNOSIS — E785 Hyperlipidemia, unspecified: Secondary | ICD-10-CM | POA: Insufficient documentation

## 2016-08-11 LAB — POCT INR: INR: 1.6

## 2016-08-11 MED ORDER — TESTOSTERONE CYPIONATE 200 MG/ML IM SOLN
100.0000 mg | Freq: Once | INTRAMUSCULAR | Status: AC
  Administered 2016-08-11: 100 mg via INTRAMUSCULAR
  Filled 2016-08-11: qty 1

## 2016-08-11 NOTE — Progress Notes (Signed)
Jeremy [name] tolerated Testosterone injection well without complaints or incident. VSS Pt discharged self ambulatory in satisfactory condition

## 2016-08-11 NOTE — Patient Instructions (Signed)
[place] at [facility] Discharge Instructions  RECOMMENDATIONS MADE BY THE CONSULTANT AND ANY TEST RESULTS WILL BE SENT TO YOUR REFERRING PHYSICIAN.  Received Testosterone injection today. Follow-up as scheduled. Call clinic for any questions or concerns  Thank you for choosing [place] at [facility] to provide your oncology and hematology care.  To afford each patient quality time with our provider, please arrive at least 15 minutes before your scheduled appointment time.    If you have a lab appointment with the [facility] please come in thru the  Main Entrance and check in at the main information desk  You need to re-schedule your appointment should you arrive 10 or more minutes late.  We strive to give you quality time with our providers, and arriving late affects you and other patients whose appointments are after yours.  Also, if you no show three or more times for appointments you may be dismissed from the clinic at the providers discretion.     Again, thank you for choosing [facility].  Our hope is that these requests will decrease the amount of time that you wait before being seen by our physicians.       _____________________________________________________________  Should you have questions after your visit to [facility] please contact our office at [phone] between the hours of 8:30 a.m. and 4:30 p.m.  Voicemails left after 4:30 p.m. will not be returned until the following business day.  For prescription refill requests, have your pharmacy contact our office.       Resources For Cancer Patients and their Caregivers ? American Cancer Society: Can assist with transportation, wigs, general needs, runs Look Good Feel Better.        769-062-6395 ? Cancer Care: Provides financial assistance, online support groups, medication/co-pay assistance.  1-800-813-HOPE 973 587 4956) ? [place] Assists [place] cancer patients and their families through emotional , educational and financial support.  586-655-5346 ? [place] DSS Where to apply for food stamps, Medicaid and utility assistance. 423-676-2955 ? RCATS: Transportation to medical appointments. 564-281-9191 ? Social Security Administration: May apply for disability if have a Stage IV cancer. 571-028-0210 224-057-3813 ? [employer], Disability and Transit Services: Assists with nutrition, care and transit needs. [place] [employer] Support Programs: @10RELATIVEDAYS @ > Cancer Support Group  2nd Tuesday of the month 1pm-2pm, Journey Room  > Creative Journey  3rd Tuesday of the month 1130am-1pm, Journey Room  > Look Good Feel Better  1st Wednesday of the month 10am-12 noon, Journey Room (Call [facility] to register [phone])

## 2016-08-12 DIAGNOSIS — N186 End stage renal disease: Secondary | ICD-10-CM | POA: Diagnosis not present

## 2016-08-12 DIAGNOSIS — D631 Anemia in chronic kidney disease: Secondary | ICD-10-CM | POA: Diagnosis not present

## 2016-08-12 DIAGNOSIS — N2581 Secondary hyperparathyroidism of renal origin: Secondary | ICD-10-CM | POA: Diagnosis not present

## 2016-08-12 DIAGNOSIS — Z992 Dependence on renal dialysis: Secondary | ICD-10-CM | POA: Diagnosis not present

## 2016-08-13 ENCOUNTER — Ambulatory Visit (HOSPITAL_COMMUNITY): Payer: Medicare Other

## 2016-08-14 ENCOUNTER — Ambulatory Visit (INDEPENDENT_AMBULATORY_CARE_PROVIDER_SITE_OTHER): Payer: Self-pay | Admitting: Pharmacist

## 2016-08-14 DIAGNOSIS — Z952 Presence of prosthetic heart valve: Secondary | ICD-10-CM

## 2016-08-14 DIAGNOSIS — D631 Anemia in chronic kidney disease: Secondary | ICD-10-CM | POA: Diagnosis not present

## 2016-08-14 DIAGNOSIS — N186 End stage renal disease: Secondary | ICD-10-CM | POA: Diagnosis not present

## 2016-08-14 DIAGNOSIS — Z992 Dependence on renal dialysis: Secondary | ICD-10-CM | POA: Diagnosis not present

## 2016-08-14 DIAGNOSIS — Z5181 Encounter for therapeutic drug level monitoring: Secondary | ICD-10-CM

## 2016-08-14 DIAGNOSIS — N2581 Secondary hyperparathyroidism of renal origin: Secondary | ICD-10-CM | POA: Diagnosis not present

## 2016-08-14 DIAGNOSIS — Z7901 Long term (current) use of anticoagulants: Secondary | ICD-10-CM

## 2016-08-14 LAB — POCT INR: INR: 2.3

## 2016-08-15 LAB — POCT INR: INR: 1.6 — AB (ref 2.0–3.0)

## 2016-08-16 DIAGNOSIS — Z992 Dependence on renal dialysis: Secondary | ICD-10-CM | POA: Diagnosis not present

## 2016-08-16 DIAGNOSIS — N186 End stage renal disease: Secondary | ICD-10-CM | POA: Diagnosis not present

## 2016-08-16 DIAGNOSIS — D631 Anemia in chronic kidney disease: Secondary | ICD-10-CM | POA: Diagnosis not present

## 2016-08-16 DIAGNOSIS — N2581 Secondary hyperparathyroidism of renal origin: Secondary | ICD-10-CM | POA: Diagnosis not present

## 2016-08-18 DIAGNOSIS — N186 End stage renal disease: Secondary | ICD-10-CM | POA: Diagnosis not present

## 2016-08-18 DIAGNOSIS — D631 Anemia in chronic kidney disease: Secondary | ICD-10-CM | POA: Diagnosis not present

## 2016-08-18 DIAGNOSIS — N2581 Secondary hyperparathyroidism of renal origin: Secondary | ICD-10-CM | POA: Diagnosis not present

## 2016-08-18 DIAGNOSIS — Z992 Dependence on renal dialysis: Secondary | ICD-10-CM | POA: Diagnosis not present

## 2016-08-19 ENCOUNTER — Ambulatory Visit: Payer: Self-pay | Admitting: Pharmacist

## 2016-08-19 DIAGNOSIS — Z952 Presence of prosthetic heart valve: Secondary | ICD-10-CM

## 2016-08-19 DIAGNOSIS — Z5181 Encounter for therapeutic drug level monitoring: Secondary | ICD-10-CM

## 2016-08-19 DIAGNOSIS — Z7901 Long term (current) use of anticoagulants: Secondary | ICD-10-CM

## 2016-08-19 LAB — POCT INR: INR: 3.3

## 2016-08-20 DIAGNOSIS — N2581 Secondary hyperparathyroidism of renal origin: Secondary | ICD-10-CM | POA: Diagnosis not present

## 2016-08-20 DIAGNOSIS — Z992 Dependence on renal dialysis: Secondary | ICD-10-CM | POA: Diagnosis not present

## 2016-08-20 DIAGNOSIS — D631 Anemia in chronic kidney disease: Secondary | ICD-10-CM | POA: Diagnosis not present

## 2016-08-20 DIAGNOSIS — N186 End stage renal disease: Secondary | ICD-10-CM | POA: Diagnosis not present

## 2016-08-21 ENCOUNTER — Ambulatory Visit (INDEPENDENT_AMBULATORY_CARE_PROVIDER_SITE_OTHER): Payer: Self-pay | Admitting: Pharmacist

## 2016-08-21 DIAGNOSIS — Z952 Presence of prosthetic heart valve: Secondary | ICD-10-CM

## 2016-08-21 DIAGNOSIS — Z5181 Encounter for therapeutic drug level monitoring: Secondary | ICD-10-CM

## 2016-08-21 DIAGNOSIS — Z7901 Long term (current) use of anticoagulants: Secondary | ICD-10-CM

## 2016-08-21 LAB — POCT INR: INR: 2

## 2016-08-22 DIAGNOSIS — Z992 Dependence on renal dialysis: Secondary | ICD-10-CM | POA: Diagnosis not present

## 2016-08-22 DIAGNOSIS — N186 End stage renal disease: Secondary | ICD-10-CM | POA: Diagnosis not present

## 2016-08-22 DIAGNOSIS — D631 Anemia in chronic kidney disease: Secondary | ICD-10-CM | POA: Diagnosis not present

## 2016-08-22 DIAGNOSIS — N2581 Secondary hyperparathyroidism of renal origin: Secondary | ICD-10-CM | POA: Diagnosis not present

## 2016-08-25 ENCOUNTER — Other Ambulatory Visit (HOSPITAL_COMMUNITY): Payer: Self-pay | Admitting: Oncology

## 2016-08-25 ENCOUNTER — Encounter (HOSPITAL_COMMUNITY): Payer: Medicare Other

## 2016-08-25 ENCOUNTER — Encounter (HOSPITAL_BASED_OUTPATIENT_CLINIC_OR_DEPARTMENT_OTHER): Payer: Medicare Other

## 2016-08-25 VITALS — BP 153/69 | HR 72 | Temp 97.8°F | Resp 18

## 2016-08-25 DIAGNOSIS — N186 End stage renal disease: Secondary | ICD-10-CM

## 2016-08-25 DIAGNOSIS — E785 Hyperlipidemia, unspecified: Secondary | ICD-10-CM | POA: Diagnosis not present

## 2016-08-25 DIAGNOSIS — Z992 Dependence on renal dialysis: Secondary | ICD-10-CM

## 2016-08-25 DIAGNOSIS — E291 Testicular hypofunction: Secondary | ICD-10-CM

## 2016-08-25 DIAGNOSIS — Z952 Presence of prosthetic heart valve: Secondary | ICD-10-CM | POA: Diagnosis not present

## 2016-08-25 DIAGNOSIS — M1990 Unspecified osteoarthritis, unspecified site: Secondary | ICD-10-CM | POA: Diagnosis not present

## 2016-08-25 DIAGNOSIS — G4733 Obstructive sleep apnea (adult) (pediatric): Secondary | ICD-10-CM | POA: Diagnosis not present

## 2016-08-25 DIAGNOSIS — D631 Anemia in chronic kidney disease: Secondary | ICD-10-CM

## 2016-08-25 DIAGNOSIS — Z7901 Long term (current) use of anticoagulants: Secondary | ICD-10-CM | POA: Diagnosis not present

## 2016-08-25 DIAGNOSIS — N183 Chronic kidney disease, stage 3 unspecified: Secondary | ICD-10-CM

## 2016-08-25 DIAGNOSIS — N189 Chronic kidney disease, unspecified: Secondary | ICD-10-CM | POA: Diagnosis not present

## 2016-08-25 DIAGNOSIS — Z940 Kidney transplant status: Secondary | ICD-10-CM | POA: Diagnosis not present

## 2016-08-25 DIAGNOSIS — I129 Hypertensive chronic kidney disease with stage 1 through stage 4 chronic kidney disease, or unspecified chronic kidney disease: Secondary | ICD-10-CM | POA: Diagnosis not present

## 2016-08-25 DIAGNOSIS — Z79899 Other long term (current) drug therapy: Secondary | ICD-10-CM | POA: Diagnosis not present

## 2016-08-25 DIAGNOSIS — N2581 Secondary hyperparathyroidism of renal origin: Secondary | ICD-10-CM | POA: Diagnosis not present

## 2016-08-25 DIAGNOSIS — Z85528 Personal history of other malignant neoplasm of kidney: Secondary | ICD-10-CM | POA: Diagnosis not present

## 2016-08-25 DIAGNOSIS — D649 Anemia, unspecified: Secondary | ICD-10-CM | POA: Diagnosis not present

## 2016-08-25 DIAGNOSIS — Z9889 Other specified postprocedural states: Secondary | ICD-10-CM | POA: Diagnosis not present

## 2016-08-25 LAB — POCT INR: INR: 3.6

## 2016-08-25 MED ORDER — TESTOSTERONE CYPIONATE 200 MG/ML IM SOLN
200.0000 mg | INTRAMUSCULAR | Status: DC
  Administered 2016-08-25: 200 mg via INTRAMUSCULAR
  Filled 2016-08-25: qty 1

## 2016-08-25 NOTE — Progress Notes (Signed)
Discussed treatment plan with Dr.Zhou and T.Kefalas,PA. Will increase depotestosterone to 200 mg injection today and increase frequency to every 28 days. Jeremy [name] presents today for injection per MD orders. Depotestosterone 200 mg administered IM in right upper outer Gluteal. Administration without incident. Patient tolerated well. Stable and ambulatory on discharge home to self.

## 2016-08-25 NOTE — Patient Instructions (Signed)
[place] at [facility] Discharge Instructions  RECOMMENDATIONS MADE BY THE CONSULTANT AND ANY TEST RESULTS WILL BE SENT TO YOUR REFERRING PHYSICIAN.  Depotestosterone injection increased to 200 mg today. Will change frequency to every 28 days. Return as scheduled.  Thank you for choosing [place] at [facility] to provide your oncology and hematology care.  To afford each patient quality time with our provider, please arrive at least 15 minutes before your scheduled appointment time.    If you have a lab appointment with the [facility] please come in thru the  Main Entrance and check in at the main information desk  You need to re-schedule your appointment should you arrive 10 or more minutes late.  We strive to give you quality time with our providers, and arriving late affects you and other patients whose appointments are after yours.  Also, if you no show three or more times for appointments you may be dismissed from the clinic at the providers discretion.     Again, thank you for choosing [facility].  Our hope is that these requests will decrease the amount of time that you wait before being seen by our physicians.       _____________________________________________________________  Should you have questions after your visit to [facility], please contact our office at [phone] between the hours of 8:30 a.m. and 4:30 p.m.  Voicemails left after 4:30 p.m. will not be returned until the following business day.  For prescription refill requests, have your pharmacy contact our office.       Resources For Cancer Patients and their Caregivers ? American Cancer Society: Can assist with transportation, wigs, general needs, runs Look Good Feel Better.        [phone] ? Cancer Care: Provides financial assistance, online support groups, medication/co-pay assistance.  1-800-813-HOPE 939-697-5323) ? [place] Assists [place] cancer patients and their families through emotional , educational and financial support.  (954)385-1319 ? [place] DSS Where to apply for food stamps, Medicaid and utility assistance. 857-612-2267 ? RCATS: Transportation to medical appointments. 820-028-5242 ? Social Security Administration: May apply for disability if have a Stage IV cancer. 425-012-9942 [phone]) 770-4272 ? [employer], Disability and Transit Services: Assists with nutrition, care and transit needs. [place] Support Programs: @10RELATIVEDAYS @ > Cancer Support Group  2nd Tuesday of the month 1pm-2pm, Journey Room  > Creative Journey  3rd Tuesday of the month 1130am-1pm, Journey Room  > Look Good Feel Better  1st Wednesday of the month 10am-12 noon, Journey Room (Call [place] to register [phone])  depotestosterone

## 2016-08-26 ENCOUNTER — Telehealth: Payer: Self-pay | Admitting: *Deleted

## 2016-08-26 ENCOUNTER — Ambulatory Visit (INDEPENDENT_AMBULATORY_CARE_PROVIDER_SITE_OTHER): Payer: Self-pay | Admitting: Pharmacist

## 2016-08-26 DIAGNOSIS — Z952 Presence of prosthetic heart valve: Secondary | ICD-10-CM

## 2016-08-26 DIAGNOSIS — Z7901 Long term (current) use of anticoagulants: Secondary | ICD-10-CM

## 2016-08-26 DIAGNOSIS — Z5181 Encounter for therapeutic drug level monitoring: Secondary | ICD-10-CM

## 2016-08-26 LAB — TESTOSTERONE: TESTOSTERONE: 159 ng/dL — AB (ref 264–916)

## 2016-08-26 NOTE — Progress Notes (Signed)
Patient ID: Jeremy [name], male   DOB: [birth year], 61 y.o.   MRN: [id number]   No charge for labs or visit - INR check through home monitoring system

## 2016-08-26 NOTE — Telephone Encounter (Signed)
MD/INR just called in INR of 3.6. They said it was from yesterday.

## 2016-08-26 NOTE — Telephone Encounter (Signed)
Left message for patient that INR was received today - recommended since INR was supratherapeutic to hold warfarin for 1 day, then decrease warfarin dose to 1mg  MWF and 2mg  all other days.  Continue to check INR weekly.

## 2016-08-27 ENCOUNTER — Other Ambulatory Visit (HOSPITAL_COMMUNITY): Payer: Medicare Other

## 2016-08-27 ENCOUNTER — Ambulatory Visit (HOSPITAL_COMMUNITY): Payer: Medicare Other

## 2016-08-27 DIAGNOSIS — D631 Anemia in chronic kidney disease: Secondary | ICD-10-CM | POA: Diagnosis not present

## 2016-08-27 DIAGNOSIS — N186 End stage renal disease: Secondary | ICD-10-CM | POA: Diagnosis not present

## 2016-08-27 DIAGNOSIS — Z992 Dependence on renal dialysis: Secondary | ICD-10-CM | POA: Diagnosis not present

## 2016-08-27 DIAGNOSIS — N2581 Secondary hyperparathyroidism of renal origin: Secondary | ICD-10-CM | POA: Diagnosis not present

## 2016-08-29 ENCOUNTER — Ambulatory Visit (INDEPENDENT_AMBULATORY_CARE_PROVIDER_SITE_OTHER): Payer: Self-pay | Admitting: Pharmacist

## 2016-08-29 DIAGNOSIS — N2581 Secondary hyperparathyroidism of renal origin: Secondary | ICD-10-CM | POA: Diagnosis not present

## 2016-08-29 DIAGNOSIS — Z7901 Long term (current) use of anticoagulants: Secondary | ICD-10-CM

## 2016-08-29 DIAGNOSIS — D631 Anemia in chronic kidney disease: Secondary | ICD-10-CM | POA: Diagnosis not present

## 2016-08-29 DIAGNOSIS — Z5181 Encounter for therapeutic drug level monitoring: Secondary | ICD-10-CM

## 2016-08-29 DIAGNOSIS — Z952 Presence of prosthetic heart valve: Secondary | ICD-10-CM

## 2016-08-29 DIAGNOSIS — Z992 Dependence on renal dialysis: Secondary | ICD-10-CM | POA: Diagnosis not present

## 2016-08-29 DIAGNOSIS — N186 End stage renal disease: Secondary | ICD-10-CM | POA: Diagnosis not present

## 2016-08-29 LAB — POCT INR: INR: 3.1

## 2016-09-01 DIAGNOSIS — N186 End stage renal disease: Secondary | ICD-10-CM | POA: Diagnosis not present

## 2016-09-01 DIAGNOSIS — Z992 Dependence on renal dialysis: Secondary | ICD-10-CM | POA: Diagnosis not present

## 2016-09-01 DIAGNOSIS — D631 Anemia in chronic kidney disease: Secondary | ICD-10-CM | POA: Diagnosis not present

## 2016-09-01 DIAGNOSIS — N2581 Secondary hyperparathyroidism of renal origin: Secondary | ICD-10-CM | POA: Diagnosis not present

## 2016-09-02 ENCOUNTER — Ambulatory Visit (INDEPENDENT_AMBULATORY_CARE_PROVIDER_SITE_OTHER): Payer: Self-pay | Admitting: Pharmacist

## 2016-09-02 ENCOUNTER — Other Ambulatory Visit (HOSPITAL_COMMUNITY): Payer: Medicare Other

## 2016-09-02 ENCOUNTER — Ambulatory Visit (HOSPITAL_COMMUNITY): Payer: Medicare Other

## 2016-09-02 DIAGNOSIS — Z952 Presence of prosthetic heart valve: Secondary | ICD-10-CM

## 2016-09-02 DIAGNOSIS — Z954 Presence of other heart-valve replacement: Secondary | ICD-10-CM | POA: Diagnosis not present

## 2016-09-02 DIAGNOSIS — Z7901 Long term (current) use of anticoagulants: Secondary | ICD-10-CM | POA: Diagnosis not present

## 2016-09-02 DIAGNOSIS — L821 Other seborrheic keratosis: Secondary | ICD-10-CM | POA: Diagnosis not present

## 2016-09-02 DIAGNOSIS — D229 Melanocytic nevi, unspecified: Secondary | ICD-10-CM | POA: Diagnosis not present

## 2016-09-02 DIAGNOSIS — Z5181 Encounter for therapeutic drug level monitoring: Secondary | ICD-10-CM

## 2016-09-02 LAB — POCT INR: INR: 1.9

## 2016-09-03 DIAGNOSIS — N186 End stage renal disease: Secondary | ICD-10-CM | POA: Diagnosis not present

## 2016-09-03 DIAGNOSIS — Z992 Dependence on renal dialysis: Secondary | ICD-10-CM | POA: Diagnosis not present

## 2016-09-03 DIAGNOSIS — N2581 Secondary hyperparathyroidism of renal origin: Secondary | ICD-10-CM | POA: Diagnosis not present

## 2016-09-03 DIAGNOSIS — D631 Anemia in chronic kidney disease: Secondary | ICD-10-CM | POA: Diagnosis not present

## 2016-09-04 ENCOUNTER — Other Ambulatory Visit (HOSPITAL_COMMUNITY): Payer: Medicare Other

## 2016-09-04 ENCOUNTER — Ambulatory Visit (HOSPITAL_COMMUNITY): Payer: Medicare Other

## 2016-09-04 DIAGNOSIS — I080 Rheumatic disorders of both mitral and aortic valves: Secondary | ICD-10-CM | POA: Diagnosis not present

## 2016-09-04 DIAGNOSIS — Z992 Dependence on renal dialysis: Secondary | ICD-10-CM | POA: Diagnosis not present

## 2016-09-04 DIAGNOSIS — I351 Nonrheumatic aortic (valve) insufficiency: Secondary | ICD-10-CM | POA: Diagnosis not present

## 2016-09-04 DIAGNOSIS — Z01810 Encounter for preprocedural cardiovascular examination: Secondary | ICD-10-CM | POA: Diagnosis not present

## 2016-09-04 DIAGNOSIS — Z952 Presence of prosthetic heart valve: Secondary | ICD-10-CM | POA: Diagnosis not present

## 2016-09-04 DIAGNOSIS — I517 Cardiomegaly: Secondary | ICD-10-CM | POA: Diagnosis not present

## 2016-09-04 DIAGNOSIS — E042 Nontoxic multinodular goiter: Secondary | ICD-10-CM | POA: Diagnosis not present

## 2016-09-04 DIAGNOSIS — Z01818 Encounter for other preprocedural examination: Secondary | ICD-10-CM | POA: Diagnosis not present

## 2016-09-04 DIAGNOSIS — N186 End stage renal disease: Secondary | ICD-10-CM | POA: Diagnosis not present

## 2016-09-04 DIAGNOSIS — T8610 Unspecified complication of kidney transplant: Secondary | ICD-10-CM | POA: Diagnosis not present

## 2016-09-04 DIAGNOSIS — I340 Nonrheumatic mitral (valve) insufficiency: Secondary | ICD-10-CM | POA: Diagnosis not present

## 2016-09-04 DIAGNOSIS — T8612 Kidney transplant failure: Secondary | ICD-10-CM | POA: Diagnosis not present

## 2016-09-05 DIAGNOSIS — D631 Anemia in chronic kidney disease: Secondary | ICD-10-CM | POA: Diagnosis not present

## 2016-09-05 DIAGNOSIS — N186 End stage renal disease: Secondary | ICD-10-CM | POA: Diagnosis not present

## 2016-09-05 DIAGNOSIS — Z992 Dependence on renal dialysis: Secondary | ICD-10-CM | POA: Diagnosis not present

## 2016-09-05 DIAGNOSIS — N2581 Secondary hyperparathyroidism of renal origin: Secondary | ICD-10-CM | POA: Diagnosis not present

## 2016-09-08 DIAGNOSIS — N2581 Secondary hyperparathyroidism of renal origin: Secondary | ICD-10-CM | POA: Diagnosis not present

## 2016-09-08 DIAGNOSIS — N186 End stage renal disease: Secondary | ICD-10-CM | POA: Diagnosis not present

## 2016-09-08 DIAGNOSIS — D631 Anemia in chronic kidney disease: Secondary | ICD-10-CM | POA: Diagnosis not present

## 2016-09-08 DIAGNOSIS — Z992 Dependence on renal dialysis: Secondary | ICD-10-CM | POA: Diagnosis not present

## 2016-09-08 LAB — POCT INR: INR: 2.9

## 2016-09-09 ENCOUNTER — Ambulatory Visit (INDEPENDENT_AMBULATORY_CARE_PROVIDER_SITE_OTHER): Payer: 59 | Admitting: Pharmacist

## 2016-09-09 DIAGNOSIS — Z5181 Encounter for therapeutic drug level monitoring: Secondary | ICD-10-CM

## 2016-09-09 DIAGNOSIS — Z952 Presence of prosthetic heart valve: Secondary | ICD-10-CM | POA: Diagnosis not present

## 2016-09-09 DIAGNOSIS — Z7901 Long term (current) use of anticoagulants: Secondary | ICD-10-CM

## 2016-09-09 NOTE — Progress Notes (Signed)
Patient ID: Jeremy [name], male   DOB: 03-24-1956, 61 y.o.   MRN: [id number]   Patient checks INR at home with Home INR monitoring.  Billing once per month interupertation fee.  Patient diagnosis - prosthetic heart valve / chronic anticoagualtion Procedure code if F3832

## 2016-09-10 DIAGNOSIS — N186 End stage renal disease: Secondary | ICD-10-CM | POA: Diagnosis not present

## 2016-09-10 DIAGNOSIS — N2581 Secondary hyperparathyroidism of renal origin: Secondary | ICD-10-CM | POA: Diagnosis not present

## 2016-09-10 DIAGNOSIS — D631 Anemia in chronic kidney disease: Secondary | ICD-10-CM | POA: Diagnosis not present

## 2016-09-10 DIAGNOSIS — Z992 Dependence on renal dialysis: Secondary | ICD-10-CM | POA: Diagnosis not present

## 2016-09-11 LAB — POCT INR: INR: 2

## 2016-09-12 ENCOUNTER — Ambulatory Visit (INDEPENDENT_AMBULATORY_CARE_PROVIDER_SITE_OTHER): Payer: Self-pay | Admitting: Pharmacist

## 2016-09-12 DIAGNOSIS — D631 Anemia in chronic kidney disease: Secondary | ICD-10-CM | POA: Diagnosis not present

## 2016-09-12 DIAGNOSIS — Z952 Presence of prosthetic heart valve: Secondary | ICD-10-CM

## 2016-09-12 DIAGNOSIS — Z992 Dependence on renal dialysis: Secondary | ICD-10-CM | POA: Diagnosis not present

## 2016-09-12 DIAGNOSIS — Z5181 Encounter for therapeutic drug level monitoring: Secondary | ICD-10-CM

## 2016-09-12 DIAGNOSIS — N186 End stage renal disease: Secondary | ICD-10-CM | POA: Diagnosis not present

## 2016-09-12 DIAGNOSIS — N2581 Secondary hyperparathyroidism of renal origin: Secondary | ICD-10-CM | POA: Diagnosis not present

## 2016-09-12 DIAGNOSIS — Z7901 Long term (current) use of anticoagulants: Secondary | ICD-10-CM

## 2016-09-12 NOTE — Progress Notes (Signed)
No charge for labs or visit - INR check through home monitoring system  

## 2016-09-15 ENCOUNTER — Ambulatory Visit (HOSPITAL_COMMUNITY): Payer: Medicare Other

## 2016-09-15 DIAGNOSIS — N186 End stage renal disease: Secondary | ICD-10-CM | POA: Diagnosis not present

## 2016-09-15 DIAGNOSIS — D631 Anemia in chronic kidney disease: Secondary | ICD-10-CM | POA: Diagnosis not present

## 2016-09-15 DIAGNOSIS — Z992 Dependence on renal dialysis: Secondary | ICD-10-CM | POA: Diagnosis not present

## 2016-09-15 DIAGNOSIS — N2581 Secondary hyperparathyroidism of renal origin: Secondary | ICD-10-CM | POA: Diagnosis not present

## 2016-09-16 ENCOUNTER — Other Ambulatory Visit (HOSPITAL_COMMUNITY): Payer: Medicare Other

## 2016-09-16 ENCOUNTER — Ambulatory Visit (HOSPITAL_COMMUNITY): Payer: Medicare Other

## 2016-09-16 ENCOUNTER — Ambulatory Visit (INDEPENDENT_AMBULATORY_CARE_PROVIDER_SITE_OTHER): Payer: Self-pay | Admitting: Pharmacist

## 2016-09-16 DIAGNOSIS — Z952 Presence of prosthetic heart valve: Secondary | ICD-10-CM

## 2016-09-16 DIAGNOSIS — Z5181 Encounter for therapeutic drug level monitoring: Secondary | ICD-10-CM

## 2016-09-16 DIAGNOSIS — Z7901 Long term (current) use of anticoagulants: Secondary | ICD-10-CM

## 2016-09-16 LAB — POCT INR: INR: 1.5

## 2016-09-17 ENCOUNTER — Ambulatory Visit (HOSPITAL_COMMUNITY): Payer: Medicare Other

## 2016-09-17 DIAGNOSIS — N186 End stage renal disease: Secondary | ICD-10-CM | POA: Diagnosis not present

## 2016-09-17 DIAGNOSIS — N2581 Secondary hyperparathyroidism of renal origin: Secondary | ICD-10-CM | POA: Diagnosis not present

## 2016-09-17 DIAGNOSIS — D631 Anemia in chronic kidney disease: Secondary | ICD-10-CM | POA: Diagnosis not present

## 2016-09-17 DIAGNOSIS — Z992 Dependence on renal dialysis: Secondary | ICD-10-CM | POA: Diagnosis not present

## 2016-09-17 NOTE — Telephone Encounter (Signed)
done

## 2016-09-19 ENCOUNTER — Ambulatory Visit (INDEPENDENT_AMBULATORY_CARE_PROVIDER_SITE_OTHER): Payer: Self-pay | Admitting: Pharmacist

## 2016-09-19 DIAGNOSIS — Z952 Presence of prosthetic heart valve: Secondary | ICD-10-CM

## 2016-09-19 DIAGNOSIS — D631 Anemia in chronic kidney disease: Secondary | ICD-10-CM | POA: Diagnosis not present

## 2016-09-19 DIAGNOSIS — N2581 Secondary hyperparathyroidism of renal origin: Secondary | ICD-10-CM | POA: Diagnosis not present

## 2016-09-19 DIAGNOSIS — Z7901 Long term (current) use of anticoagulants: Secondary | ICD-10-CM

## 2016-09-19 DIAGNOSIS — Z5181 Encounter for therapeutic drug level monitoring: Secondary | ICD-10-CM

## 2016-09-19 DIAGNOSIS — Z992 Dependence on renal dialysis: Secondary | ICD-10-CM | POA: Diagnosis not present

## 2016-09-19 DIAGNOSIS — N186 End stage renal disease: Secondary | ICD-10-CM | POA: Diagnosis not present

## 2016-09-19 LAB — POCT INR: INR: 3.2

## 2016-09-22 DIAGNOSIS — Z992 Dependence on renal dialysis: Secondary | ICD-10-CM | POA: Diagnosis not present

## 2016-09-22 DIAGNOSIS — N186 End stage renal disease: Secondary | ICD-10-CM | POA: Diagnosis not present

## 2016-09-22 DIAGNOSIS — D631 Anemia in chronic kidney disease: Secondary | ICD-10-CM | POA: Diagnosis not present

## 2016-09-22 DIAGNOSIS — N2581 Secondary hyperparathyroidism of renal origin: Secondary | ICD-10-CM | POA: Diagnosis not present

## 2016-09-23 ENCOUNTER — Encounter (HOSPITAL_COMMUNITY): Payer: Self-pay

## 2016-09-23 ENCOUNTER — Encounter (HOSPITAL_COMMUNITY): Payer: Medicare Other | Attending: Hematology & Oncology

## 2016-09-23 VITALS — BP 144/65 | HR 86 | Temp 98.5°F | Resp 18

## 2016-09-23 DIAGNOSIS — E291 Testicular hypofunction: Secondary | ICD-10-CM

## 2016-09-23 DIAGNOSIS — I129 Hypertensive chronic kidney disease with stage 1 through stage 4 chronic kidney disease, or unspecified chronic kidney disease: Secondary | ICD-10-CM | POA: Insufficient documentation

## 2016-09-23 DIAGNOSIS — Z7901 Long term (current) use of anticoagulants: Secondary | ICD-10-CM | POA: Insufficient documentation

## 2016-09-23 DIAGNOSIS — D631 Anemia in chronic kidney disease: Secondary | ICD-10-CM

## 2016-09-23 DIAGNOSIS — Z952 Presence of prosthetic heart valve: Secondary | ICD-10-CM | POA: Insufficient documentation

## 2016-09-23 DIAGNOSIS — E785 Hyperlipidemia, unspecified: Secondary | ICD-10-CM | POA: Insufficient documentation

## 2016-09-23 DIAGNOSIS — Z9889 Other specified postprocedural states: Secondary | ICD-10-CM | POA: Insufficient documentation

## 2016-09-23 DIAGNOSIS — M1990 Unspecified osteoarthritis, unspecified site: Secondary | ICD-10-CM | POA: Insufficient documentation

## 2016-09-23 DIAGNOSIS — G4733 Obstructive sleep apnea (adult) (pediatric): Secondary | ICD-10-CM | POA: Insufficient documentation

## 2016-09-23 DIAGNOSIS — D649 Anemia, unspecified: Secondary | ICD-10-CM | POA: Insufficient documentation

## 2016-09-23 DIAGNOSIS — Z85528 Personal history of other malignant neoplasm of kidney: Secondary | ICD-10-CM | POA: Insufficient documentation

## 2016-09-23 DIAGNOSIS — N189 Chronic kidney disease, unspecified: Secondary | ICD-10-CM | POA: Insufficient documentation

## 2016-09-23 DIAGNOSIS — Z79899 Other long term (current) drug therapy: Secondary | ICD-10-CM | POA: Insufficient documentation

## 2016-09-23 DIAGNOSIS — N186 End stage renal disease: Secondary | ICD-10-CM

## 2016-09-23 DIAGNOSIS — N183 Chronic kidney disease, stage 3 unspecified: Secondary | ICD-10-CM

## 2016-09-23 DIAGNOSIS — Z940 Kidney transplant status: Secondary | ICD-10-CM | POA: Insufficient documentation

## 2016-09-23 DIAGNOSIS — Z992 Dependence on renal dialysis: Secondary | ICD-10-CM | POA: Insufficient documentation

## 2016-09-23 MED ORDER — TESTOSTERONE CYPIONATE 200 MG/ML IM SOLN
200.0000 mg | INTRAMUSCULAR | Status: DC
  Administered 2016-09-23: 200 mg via INTRAMUSCULAR
  Filled 2016-09-23: qty 1

## 2016-09-23 NOTE — Patient Instructions (Signed)
[place] at [facility] Discharge Instructions  RECOMMENDATIONS MADE BY THE CONSULTANT AND ANY TEST RESULTS WILL BE SENT TO YOUR REFERRING PHYSICIAN.  Injection given as ordered Follow up as scheduled.  Thank you for choosing [place] at [facility] to provide your oncology and hematology care.  To afford each patient quality time with our provider, please arrive at least 15 minutes before your scheduled appointment time.    If you have a lab appointment with the [facility] please come in thru the  Main Entrance and check in at the main information desk  You need to re-schedule your appointment should you arrive 10 or more minutes late.  We strive to give you quality time with our providers, and arriving late affects you and other patients whose appointments are after yours.  Also, if you no show three or more times for appointments you may be dismissed from the clinic at the providers discretion.     Again, thank you for choosing [facility].  Our hope is that these requests will decrease the amount of time that you wait before being seen by our physicians.       _____________________________________________________________  Should you have questions after your visit to [facility], please contact our office at [phone] between the hours of 8:30 a.m. and 4:30 p.m.  Voicemails left after 4:30 p.m. will not be returned until the following business day.  For prescription refill requests, have your pharmacy contact our office.       Resources For Cancer Patients and their Caregivers ? American Cancer Society: Can assist with transportation, wigs, general needs, runs Look Good Feel Better.        986-441-2413 ? Cancer Care: Provides financial assistance, online support groups, medication/co-pay assistance.  1-800-813-HOPE 2520422901) ? [place] Assists [place] cancer patients  and their families through emotional , educational and financial support.  (709) 625-2168 ? [place] DSS Where to apply for food stamps, Medicaid and utility assistance. 480-380-9878 ? RCATS: Transportation to medical appointments. [phone] ? Social Security Administration: May apply for disability if have a Stage IV cancer. 6712657724 9807564824 ? [employer], Disability and Transit Services: Assists with nutrition, care and transit needs. [place] Support Programs: @10RELATIVEDAYS @ > Cancer Support Group  2nd Tuesday of the month 1pm-2pm, Journey Room  > Creative Journey  3rd Tuesday of the month 1130am-1pm, Journey Room  > Look Good Feel Better  1st Wednesday of the month 10am-12 noon, Journey Room (Call [facility] to register [phone])

## 2016-09-23 NOTE — Progress Notes (Signed)
Jeremy [name] presents today for injection per MD orders.  Testosterone cypionate 200 mg administered IM in right upper buttocks Administration without incident. Patient tolerated well.  Vitals stable and discharged home from clinic ambulatory.follow up as scheduled.

## 2016-09-24 ENCOUNTER — Encounter: Payer: Self-pay | Admitting: Family Medicine

## 2016-09-24 DIAGNOSIS — N2581 Secondary hyperparathyroidism of renal origin: Secondary | ICD-10-CM | POA: Diagnosis not present

## 2016-09-24 DIAGNOSIS — D631 Anemia in chronic kidney disease: Secondary | ICD-10-CM | POA: Diagnosis not present

## 2016-09-24 DIAGNOSIS — Z992 Dependence on renal dialysis: Secondary | ICD-10-CM | POA: Diagnosis not present

## 2016-09-24 DIAGNOSIS — N186 End stage renal disease: Secondary | ICD-10-CM | POA: Diagnosis not present

## 2016-09-24 LAB — PROTIME-INR

## 2016-09-25 ENCOUNTER — Encounter: Payer: Self-pay | Admitting: Family Medicine

## 2016-09-26 DIAGNOSIS — Z992 Dependence on renal dialysis: Secondary | ICD-10-CM | POA: Diagnosis not present

## 2016-09-26 DIAGNOSIS — D631 Anemia in chronic kidney disease: Secondary | ICD-10-CM | POA: Diagnosis not present

## 2016-09-26 DIAGNOSIS — N2581 Secondary hyperparathyroidism of renal origin: Secondary | ICD-10-CM | POA: Diagnosis not present

## 2016-09-26 DIAGNOSIS — N186 End stage renal disease: Secondary | ICD-10-CM | POA: Diagnosis not present

## 2016-09-29 DIAGNOSIS — D631 Anemia in chronic kidney disease: Secondary | ICD-10-CM | POA: Diagnosis not present

## 2016-09-29 DIAGNOSIS — N2581 Secondary hyperparathyroidism of renal origin: Secondary | ICD-10-CM | POA: Diagnosis not present

## 2016-09-29 DIAGNOSIS — N186 End stage renal disease: Secondary | ICD-10-CM | POA: Diagnosis not present

## 2016-09-29 DIAGNOSIS — Z992 Dependence on renal dialysis: Secondary | ICD-10-CM | POA: Diagnosis not present

## 2016-09-30 ENCOUNTER — Other Ambulatory Visit (HOSPITAL_COMMUNITY): Payer: Medicare Other

## 2016-09-30 ENCOUNTER — Other Ambulatory Visit: Payer: Self-pay | Admitting: Family Medicine

## 2016-09-30 ENCOUNTER — Ambulatory Visit (HOSPITAL_COMMUNITY): Payer: Medicare Other

## 2016-09-30 ENCOUNTER — Encounter: Payer: Self-pay | Admitting: Family Medicine

## 2016-09-30 MED ORDER — EZETIMIBE 10 MG PO TABS: 10.0000 mg | ORAL_TABLET | Freq: Every day | ORAL | 3 refills | Status: DC

## 2016-10-01 DIAGNOSIS — N186 End stage renal disease: Secondary | ICD-10-CM | POA: Diagnosis not present

## 2016-10-01 DIAGNOSIS — Z992 Dependence on renal dialysis: Secondary | ICD-10-CM | POA: Diagnosis not present

## 2016-10-01 DIAGNOSIS — N2581 Secondary hyperparathyroidism of renal origin: Secondary | ICD-10-CM | POA: Diagnosis not present

## 2016-10-01 DIAGNOSIS — Z954 Presence of other heart-valve replacement: Secondary | ICD-10-CM | POA: Diagnosis not present

## 2016-10-01 DIAGNOSIS — Z7901 Long term (current) use of anticoagulants: Secondary | ICD-10-CM | POA: Diagnosis not present

## 2016-10-01 DIAGNOSIS — D631 Anemia in chronic kidney disease: Secondary | ICD-10-CM | POA: Diagnosis not present

## 2016-10-02 DIAGNOSIS — I517 Cardiomegaly: Secondary | ICD-10-CM | POA: Diagnosis not present

## 2016-10-02 DIAGNOSIS — I4510 Unspecified right bundle-branch block: Secondary | ICD-10-CM | POA: Diagnosis not present

## 2016-10-02 DIAGNOSIS — Z7901 Long term (current) use of anticoagulants: Secondary | ICD-10-CM | POA: Diagnosis not present

## 2016-10-02 DIAGNOSIS — I1311 Hypertensive heart and chronic kidney disease without heart failure, with stage 5 chronic kidney disease, or end stage renal disease: Secondary | ICD-10-CM | POA: Diagnosis not present

## 2016-10-02 DIAGNOSIS — Z79899 Other long term (current) drug therapy: Secondary | ICD-10-CM | POA: Diagnosis not present

## 2016-10-02 DIAGNOSIS — Z952 Presence of prosthetic heart valve: Secondary | ICD-10-CM | POA: Diagnosis not present

## 2016-10-02 DIAGNOSIS — I10 Essential (primary) hypertension: Secondary | ICD-10-CM | POA: Diagnosis present

## 2016-10-02 DIAGNOSIS — I4891 Unspecified atrial fibrillation: Secondary | ICD-10-CM | POA: Diagnosis not present

## 2016-10-02 DIAGNOSIS — R509 Fever, unspecified: Secondary | ICD-10-CM | POA: Diagnosis not present

## 2016-10-02 DIAGNOSIS — D464 Refractory anemia, unspecified: Secondary | ICD-10-CM | POA: Diagnosis not present

## 2016-10-02 DIAGNOSIS — Z66 Do not resuscitate: Secondary | ICD-10-CM | POA: Diagnosis present

## 2016-10-02 DIAGNOSIS — Z940 Kidney transplant status: Secondary | ICD-10-CM | POA: Diagnosis not present

## 2016-10-02 DIAGNOSIS — I272 Pulmonary hypertension, unspecified: Secondary | ICD-10-CM | POA: Diagnosis present

## 2016-10-02 DIAGNOSIS — N186 End stage renal disease: Secondary | ICD-10-CM | POA: Diagnosis not present

## 2016-10-02 DIAGNOSIS — I2510 Atherosclerotic heart disease of native coronary artery without angina pectoris: Secondary | ICD-10-CM | POA: Diagnosis present

## 2016-10-02 DIAGNOSIS — R109 Unspecified abdominal pain: Secondary | ICD-10-CM | POA: Diagnosis not present

## 2016-10-02 DIAGNOSIS — I359 Nonrheumatic aortic valve disorder, unspecified: Secondary | ICD-10-CM | POA: Diagnosis not present

## 2016-10-02 DIAGNOSIS — Z905 Acquired absence of kidney: Secondary | ICD-10-CM | POA: Diagnosis not present

## 2016-10-02 DIAGNOSIS — R7989 Other specified abnormal findings of blood chemistry: Secondary | ICD-10-CM | POA: Diagnosis not present

## 2016-10-02 DIAGNOSIS — D899 Disorder involving the immune mechanism, unspecified: Secondary | ICD-10-CM | POA: Diagnosis not present

## 2016-10-02 DIAGNOSIS — T8610 Unspecified complication of kidney transplant: Secondary | ICD-10-CM | POA: Diagnosis not present

## 2016-10-02 DIAGNOSIS — R197 Diarrhea, unspecified: Secondary | ICD-10-CM | POA: Diagnosis not present

## 2016-10-02 DIAGNOSIS — N028 Recurrent and persistent hematuria with other morphologic changes: Secondary | ICD-10-CM | POA: Diagnosis not present

## 2016-10-02 DIAGNOSIS — E8770 Fluid overload, unspecified: Secondary | ICD-10-CM | POA: Diagnosis present

## 2016-10-02 DIAGNOSIS — M4802 Spinal stenosis, cervical region: Secondary | ICD-10-CM | POA: Diagnosis not present

## 2016-10-02 DIAGNOSIS — E875 Hyperkalemia: Secondary | ICD-10-CM | POA: Diagnosis not present

## 2016-10-02 DIAGNOSIS — T8611 Kidney transplant rejection: Secondary | ICD-10-CM | POA: Diagnosis not present

## 2016-10-02 DIAGNOSIS — E785 Hyperlipidemia, unspecified: Secondary | ICD-10-CM | POA: Diagnosis not present

## 2016-10-02 DIAGNOSIS — Z85528 Personal history of other malignant neoplasm of kidney: Secondary | ICD-10-CM | POA: Diagnosis not present

## 2016-10-02 DIAGNOSIS — I120 Hypertensive chronic kidney disease with stage 5 chronic kidney disease or end stage renal disease: Secondary | ICD-10-CM | POA: Diagnosis not present

## 2016-10-02 DIAGNOSIS — I823 Embolism and thrombosis of renal vein: Secondary | ICD-10-CM | POA: Diagnosis not present

## 2016-10-02 DIAGNOSIS — R9431 Abnormal electrocardiogram [ECG] [EKG]: Secondary | ICD-10-CM | POA: Diagnosis not present

## 2016-10-02 DIAGNOSIS — D638 Anemia in other chronic diseases classified elsewhere: Secondary | ICD-10-CM | POA: Diagnosis present

## 2016-10-02 DIAGNOSIS — Z992 Dependence on renal dialysis: Secondary | ICD-10-CM | POA: Diagnosis not present

## 2016-10-02 DIAGNOSIS — Z888 Allergy status to other drugs, medicaments and biological substances status: Secondary | ICD-10-CM | POA: Diagnosis not present

## 2016-10-02 DIAGNOSIS — Z7952 Long term (current) use of systemic steroids: Secondary | ICD-10-CM | POA: Diagnosis not present

## 2016-10-02 DIAGNOSIS — R319 Hematuria, unspecified: Secondary | ICD-10-CM | POA: Diagnosis not present

## 2016-10-02 DIAGNOSIS — M5412 Radiculopathy, cervical region: Secondary | ICD-10-CM | POA: Diagnosis not present

## 2016-10-04 DIAGNOSIS — T8610 Unspecified complication of kidney transplant: Secondary | ICD-10-CM | POA: Diagnosis not present

## 2016-10-04 DIAGNOSIS — N186 End stage renal disease: Secondary | ICD-10-CM | POA: Diagnosis not present

## 2016-10-04 DIAGNOSIS — Z992 Dependence on renal dialysis: Secondary | ICD-10-CM | POA: Diagnosis not present

## 2016-10-06 ENCOUNTER — Ambulatory Visit (HOSPITAL_COMMUNITY): Payer: Medicare Other

## 2016-10-07 ENCOUNTER — Ambulatory Visit (HOSPITAL_COMMUNITY): Payer: Medicare Other

## 2016-10-07 ENCOUNTER — Telehealth: Payer: Self-pay | Admitting: Pharmacist

## 2016-10-07 NOTE — Telephone Encounter (Signed)
Called patient for warfarin dose adjustment per Dr Lacinda Axon as he received a INR of 3.3.  Patient states he is currently admitted for graft embolization. Will forward to Dr Lacinda Axon for followup INR and warfarin dosing post discharge  Bennye Alm, PharmD, [place] PGY2 Pharmacy Resident [phone]

## 2016-10-13 DIAGNOSIS — N186 End stage renal disease: Secondary | ICD-10-CM | POA: Diagnosis not present

## 2016-10-13 DIAGNOSIS — Z992 Dependence on renal dialysis: Secondary | ICD-10-CM | POA: Diagnosis not present

## 2016-10-13 DIAGNOSIS — D631 Anemia in chronic kidney disease: Secondary | ICD-10-CM | POA: Diagnosis not present

## 2016-10-13 DIAGNOSIS — Z23 Encounter for immunization: Secondary | ICD-10-CM | POA: Diagnosis not present

## 2016-10-13 DIAGNOSIS — N2581 Secondary hyperparathyroidism of renal origin: Secondary | ICD-10-CM | POA: Diagnosis not present

## 2016-10-13 LAB — POCT INR: INR: 2.3 — AB (ref 0.9–1.1)

## 2016-10-14 ENCOUNTER — Telehealth: Payer: Self-pay | Admitting: General Surgery

## 2016-10-14 ENCOUNTER — Ambulatory Visit (INDEPENDENT_AMBULATORY_CARE_PROVIDER_SITE_OTHER): Payer: Medicare Other | Admitting: Vascular Surgery

## 2016-10-14 ENCOUNTER — Encounter (INDEPENDENT_AMBULATORY_CARE_PROVIDER_SITE_OTHER): Payer: Self-pay | Admitting: Vascular Surgery

## 2016-10-14 ENCOUNTER — Encounter: Payer: Self-pay | Admitting: General Surgery

## 2016-10-14 VITALS — BP 132/65 | HR 98 | Resp 15 | Ht 70.0 in | Wt 189.0 lb

## 2016-10-14 DIAGNOSIS — I10 Essential (primary) hypertension: Secondary | ICD-10-CM

## 2016-10-14 DIAGNOSIS — N186 End stage renal disease: Secondary | ICD-10-CM

## 2016-10-14 DIAGNOSIS — Z992 Dependence on renal dialysis: Secondary | ICD-10-CM

## 2016-10-14 DIAGNOSIS — I2510 Atherosclerotic heart disease of native coronary artery without angina pectoris: Secondary | ICD-10-CM | POA: Diagnosis not present

## 2016-10-14 DIAGNOSIS — K439 Ventral hernia without obstruction or gangrene: Secondary | ICD-10-CM | POA: Diagnosis not present

## 2016-10-14 NOTE — Assessment & Plan Note (Signed)
The patient desires to switch over to peritoneal dialysis which is certainly reasonable. He understands his multiple previous abdominal operations may limit the effectiveness of peritoneal dialysis and he will keep his fistula as a backup option. We discussed having his ventral hernia fixed prior to peritoneal dialysis catheter placement. He is also aware that if prohibitive scar tissue is found at the time of abdominal exploration, no catheter would be placed. He would like to give it a chance because he much prefers peritoneal dialysis over hemodialysis and this is reasonable. I will see him back after his ventral hernia repair.

## 2016-10-14 NOTE — Assessment & Plan Note (Signed)
blood pressure control important in reducing the progression of atherosclerotic disease. On appropriate oral medications.  

## 2016-10-14 NOTE — Assessment & Plan Note (Signed)
Status post bypass and doing well.

## 2016-10-14 NOTE — Telephone Encounter (Signed)
LEFT MESSAGE FOR PATIENT TO CALL & MAKE AN APPOINTMENT WITH DR BYRNETT,REF'D BY DR Leotis Pain FOR VENTRAL HERNIA.

## 2016-10-14 NOTE — Assessment & Plan Note (Signed)
The patient has an upper abdominal hernia after previous abdominal surgery. We discussed that having this repaired prior to his peritoneal dialysis catheter placement would likely be best. We generally do not place these same time as the catheter for infectious reasons. I have told him that although I used to do these, I have not done a hernia repair in 5-7 years. He would like to have this fixed locally and I will refer him to a local general surgeon to have this fixed. Once this has healed, we can place his peritoneal dialysis catheter.

## 2016-10-14 NOTE — Progress Notes (Signed)
Patient ID: Jeremy [name], male   DOB: 10-Jul-1955, 61 y.o.   MRN: [id number]  Chief Complaint  Patient presents with  . New Evaluation    Discuss dialysis cath placement    HPI Jeremy [name] is a 61 y.o. male.  I am asked to see the patient by Dr. Johnney Ou for evaluation of PD catheter placement.  The patient reports 30 years or more of issues with his kidneys. 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 He started with peritoneal dialysis and then got a kidney transplant which lasted 9 years. When this failed, he went to hemodialysis and he got a second kidney transplant which lasted less than 5 years. This failed about a year ago. He is been on hemodialysis since that time, but desires to go back to peritoneal dialysis. He has had multiple previous abdominal surgeries including his kidney transplants. 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 He does have a ventral hernia in the upper part of his midline abdominal incision. He denies fever or chills. He has significant fatigue on his dialysis days.   Past Medical History:  Diagnosis Date  . 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 Arthritis   . Chronic anticoagulation 06/2012  . Dialysis patient [facility]) 01 01 2008  . Hx of colonoscopy    2009 by Dr. Laural Golden. Normal except for small submucosal lipoma. No evidence of polyps or colitis.  . Hyperlipidemia   . Hypertension   . Hypogonadism in male 07/25/2015  . 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  Marland Kitchen OSA (obstructive sleep apnea)   . Renal cell carcinoma ([place]) 08/04/2016    Past Surgical History:  Procedure Laterality Date  . 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 CORONARY ANGIOPLASTY WITH STENT PLACEMENT    . DG AV  DIALYSIS  SHUNT ACCESS EXIST*L* OR     left arm  . KIDNEY TRANSPLANT    . TEE WITHOUT CARDIOVERSION N/A 07/21/2016   Procedure: TRANSESOPHAGEAL ECHOCARDIOGRAM (TEE);  Surgeon: Wellington Hampshire, MD;  Location: [facility] ORS;  Service: Cardiovascular;  Laterality: N/A;    Family History  Problem Relation Age of Onset  . Cancer Mother        pancreatic  . Heart disease Father   . Diabetes Father   No bleeding or clotting disorders  Social History Social History  Substance Use Topics  . Smoking status: Never Smoker  . Smokeless tobacco: Never Used  . Alcohol use No  No IVDU  Allergies  Allergen Reactions  . Statins     Other reaction(s): Arthralgias (intolerance), Myalgias (intolerance)  . Atorvastatin Other (See Comments)    Muscle Aches  . Sertraline Rash    Current Outpatient Prescriptions  Medication Sig Dispense Refill  . acetaminophen (TYLENOL) 325 MG tablet Take 650 mg by mouth every 6 (six) hours as needed.    Marland Kitchen allopurinol (ZYLOPRIM) 100 MG tablet Take 200 mg by mouth daily.     Marland Kitchen amLODipine (NORVASC) 10 MG tablet Take 10 mg by mouth daily.    Marland Kitchen dicyclomine (BENTYL) 10 MG capsule Take 2 capsules (20 mg total) by mouth 3 (three) times daily before meals. (Patient taking differently: Take 20 mg by  mouth 2 (two) times daily. ) 90 capsule 4  . ezetimibe (ZETIA) 10 MG tablet Take 1 tablet (10 mg total) by mouth daily. 90 tablet 3  . labetalol (NORMODYNE) 300 MG tablet Take 300 mg by mouth 2 (two) times daily.    Marland Kitchen lanthanum (FOSRENOL) 500 MG chewable tablet Chew 1,000 mg by mouth.    . lidocaine-prilocaine (EMLA) cream Apply topically.    Marland Kitchen loratadine (CLARITIN) 10 MG tablet Take 10 mg by mouth daily as needed. Reported on 06/12/2015    . losartan (COZAAR) 25 MG tablet Take 25 mg by mouth daily.  6  . magnesium oxide (MAG-OX) 400 (241.3 MG) MG tablet Take 400 mg by mouth 2 (two) times daily.  1  . NIFEdipine (PROCARDIA-XL/ADALAT CC) 60 MG 24 hr tablet Take 60 mg by mouth daily.     . nitroGLYCERIN (NITROSTAT) 0.4 MG SL tablet Place 1 tablet (0.4 mg total) under the tongue every 5 (five) minutes as needed for chest pain. 25 tablet 2  . Omega-3 1000 MG CAPS Take 3 g by mouth.    Marland Kitchen omeprazole (PRILOSEC) 20 MG capsule Take 20 mg by mouth daily.    . pravastatin (PRAVACHOL) 40 MG tablet Take 40 mg by mouth daily.    . rosuvastatin (CRESTOR) 10 MG tablet Take 1 tablet (10 mg total) by mouth daily. 90 tablet 3  . Sevelamer Carbonate (RENVELA PO) Take 1 tablet by mouth 3 (three) times daily after meals.    . tacrolimus (PROGRAF) 1 MG capsule Take 1 mg by mouth 2 (two) times daily.    Marland Kitchen warfarin (COUMADIN) 1 MG tablet Take 1 tablet (1 mg total) by mouth daily at 6 PM. (Patient taking differently: Take 2 mg by mouth daily at 6 PM. ) 30 tablet 0  . carvedilol (COREG) 12.5 MG tablet Take 37.5 mg by mouth 2 (two) times daily.     . cinacalcet (SENSIPAR) 30 MG tablet Take 30 mg by mouth every other day.    . predniSONE (DELTASONE) 10 MG tablet Take 10 mg by mouth daily. 4 tabs daily (40 mg) x 7 days 3 tabs daily (30 mg) x 7 days 2 tabs daily (20 mg) x 7 days 1 tab daily (10 mg) x 7 days 1/2 tab daily (5 mg) x 60 days, then stop     No current facility-administered medications for this visit.       REVIEW OF SYSTEMS (Negative unless checked)  Constitutional: '[]' Weight loss  '[]' Fever  '[]' Chills Cardiac: '[]' Chest pain   '[]' Chest pressure   '[x]' Palpitations   '[]' Shortness of breath when laying flat   '[]' Shortness of breath at rest   '[x]' Shortness of breath with exertion. Vascular:  '[]' Pain in legs with walking   '[]' Pain in legs at rest   '[]' Pain in legs when laying flat   '[]' Claudication   '[]' Pain in feet when walking  '[]' Pain in feet at rest  '[]' Pain in feet when laying flat   '[]' History of DVT   '[]' Phlebitis   '[]' Swelling in legs   '[]' Varicose veins   '[]' Non-healing ulcers Pulmonary:   '[]' Uses home oxygen   '[]' Productive cough   '[]' Hemoptysis   '[]' Wheeze  '[]' COPD   '[]' Asthma Neurologic:  '[]' Dizziness   '[]' Blackouts   '[]' Seizures   '[]' History of stroke   '[]' History of TIA  '[]' Aphasia   '[]' Temporary blindness   '[]' Dysphagia   '[]' Weakness or numbness in arms   '[]' Weakness or numbness in legs Musculoskeletal:  '[]' Arthritis   '[]' Joint swelling   '[]'   Joint pain   '[]' Low back pain Hematologic:  '[]' Easy bruising  '[]' Easy bleeding   '[]' Hypercoagulable state   '[]' Anemic  '[]' Hepatitis Gastrointestinal:  '[]' Blood in stool   '[]' Vomiting blood  '[]' Gastroesophageal reflux/heartburn   '[]' Abdominal pain Genitourinary:  '[x]' Chronic kidney disease   '[]' Difficult urination  '[]' Frequent urination  '[]' Burning with urination   '[]' Hematuria Skin:  '[]' Rashes   '[]' Ulcers   '[]' Wounds Psychological:  '[]' History of anxiety   '[]'  History of major depression.    Physical Exam BP 132/65 (BP Location: Right Arm)   Pulse 98   Resp 15   Ht '5\' 10"'  (1.778 m)   Wt 189 lb (85.7 kg)   BMI 27.12 kg/m  Gen:  WD/WN, NAD Head: [place]/AT, No temporalis wasting. Ear/Nose/Throat: Hearing grossly intact, nares w/o erythema or drainage, oropharynx w/o Erythema/Exudate Eyes: Conjunctiva clear, sclera non-icteric  Neck: trachea midline.  No JVD.  Pulmonary:  Good air movement, respirations not labored, no use of accessory muscles.  Cardiac: Irregular Vascular: Aneurysmal left forearm AV fistula with good thrill Vessel Right Left  Radial Palpable Palpable                                   Gastrointestinal: soft, non-tender/non-distended. Multiple abdominal incisions including a large midline incision with a ventral hernia in the upper part of the abdomen. This is a moderate sized hernia. Musculoskeletal: M/S 5/5 throughout.  Extremities without ischemic changes.  No deformity or atrophy. Neurologic: Sensation grossly intact in extremities.  Symmetrical.  Speech is fluent. Motor exam as listed above. Psychiatric: Judgment intact, Mood & affect appropriate for pt's clinical situation. Dermatologic: No rashes or ulcers noted.  No cellulitis or open  wounds.    Radiology No results found.  Labs Recent Results (from the past 2160 hour(s))  POCT INR     Status: None   Collection Time: 07/16/16 10:00 PM  Result Value Ref Range   INR 2.7   Lactic acid, plasma     Status: None   Collection Time: 07/17/16  3:27 PM  Result Value Ref Range   Lactic Acid, Venous 1.5 0.5 - 1.9 mmol/L  Comprehensive metabolic panel     Status: Abnormal   Collection Time: 07/17/16  3:27 PM  Result Value Ref Range   Sodium 136 135 - 145 mmol/L   Potassium 4.0 3.5 - 5.1 mmol/L   Chloride 96 (L) 101 - 111 mmol/L   CO2 29 22 - 32 mmol/L   Glucose, Bld 127 (H) 65 - 99 mg/dL   BUN 21 (H) 6 - 20 mg/dL   Creatinine, Ser 5.37 (H) 0.61 - 1.24 mg/dL   Calcium 8.9 8.9 - 10.3 mg/dL   Total Protein 7.9 6.5 - 8.1 g/dL   Albumin 3.0 (L) 3.5 - 5.0 g/dL   AST 25 15 - 41 U/L   ALT 13 (L) 17 - 63 U/L   Alkaline Phosphatase 191 (H) 38 - 126 U/L   Total Bilirubin 0.8 0.3 - 1.2 mg/dL   GFR calc non Af Amer 11 (L) >60 mL/min   GFR calc Af Amer 12 (L) >60 mL/min    Comment: (NOTE) The eGFR has been calculated using the CKD EPI equation. This calculation has not been validated in all clinical situations. eGFR's persistently <60 mL/min signify possible Chronic Kidney Disease.    Anion gap 11 5 - 15  Troponin I     Status: Abnormal   Collection Time:  07/17/16  3:27 PM  Result Value Ref Range   Troponin I 0.06 (HH) <0.03 ng/mL    Comment: CRITICAL RESULT CALLED TO, READ BACK BY AND VERIFIED WITH ASHLEY SMITH 07/17/16 @ [facility]   CBC WITH DIFFERENTIAL     Status: Abnormal   Collection Time: 07/17/16  3:27 PM  Result Value Ref Range   WBC 7.1 3.8 - 10.6 K/uL   RBC 2.84 (L) 4.40 - 5.90 MIL/uL   Hemoglobin 9.7 (L) 13.0 - 18.0 g/dL   HCT 29.0 (L) 40.0 - 52.0 %   MCV 102.1 (H) 80.0 - 100.0 fL   MCH 34.0 26.0 - 34.0 pg   MCHC 33.3 32.0 - 36.0 g/dL   RDW 16.5 (H) 11.5 - 14.5 %   Platelets 185 150 - 440 K/uL   Neutrophils Relative % 52 %   Neutro Abs 3.7 1.4 - 6.5  K/uL   Lymphocytes Relative 25 %   Lymphs Abs 1.8 1.0 - 3.6 K/uL   Monocytes Relative 10 %   Monocytes Absolute 0.7 0.2 - 1.0 K/uL   Eosinophils Relative 12 %   Eosinophils Absolute 0.9 (H) 0 - 0.7 K/uL   Basophils Relative 1 %   Basophils Absolute 0.0 0 - 0.1 K/uL  Procalcitonin     Status: None   Collection Time: 07/17/16  3:27 PM  Result Value Ref Range   Procalcitonin 1.61 ng/mL    Comment:        Interpretation: PCT > 0.5 ng/mL and <= 2 ng/mL: Systemic infection (sepsis) is possible, but other conditions are known to elevate PCT as well. (NOTE)         ICU PCT Algorithm               Non ICU PCT Algorithm    ----------------------------     ------------------------------         PCT < 0.25 ng/mL                 PCT < 0.1 ng/mL     Stopping of antibiotics            Stopping of antibiotics       strongly encouraged.               strongly encouraged.    ----------------------------     ------------------------------       PCT level decrease by               PCT < 0.25 ng/mL       >= 80% from peak PCT       OR PCT 0.25 - 0.5 ng/mL          Stopping of antibiotics                                             encouraged.     Stopping of antibiotics           encouraged.    ----------------------------     ------------------------------       PCT level decrease by              PCT >= 0.25 ng/mL       < 80% from peak PCT        AND PCT >= 0.5 ng/mL  Continuing antibiotics                                              encouraged.       Continuing antibiotics            encouraged.    ----------------------------     ------------------------------     PCT level increase compared          PCT > 0.5 ng/mL         with peak PCT AND          PCT >= 0.5 ng/mL             Escalation of antibiotics                                          strongly encouraged.      Escalation of antibiotics        strongly encouraged.   Blood Culture (routine x 2)     Status: None    Collection Time: 07/17/16  3:27 PM  Result Value Ref Range   Specimen Description BLOOD RIGHT HAND    Special Requests      BOTTLES DRAWN AEROBIC AND ANAEROBIC Blood Culture adequate volume   Culture NO GROWTH 5 DAYS    Report Status 07/22/2016 FINAL   [place] mtn spotted fvr abs pnl(IgG+IgM)     Status: None   Collection Time: 07/17/16  3:27 PM  Result Value Ref Range   RMSF IgG Negative Negative   RMSF IgM 0.64 0.00 - 0.89 index    Comment: (NOTE)                                 Negative        <0.90                                 Equivocal 0.90 - 1.10                                 Positive        >1.10 Performed At: [facility] [place], [name] [id number] Lindon Romp MD KM:[id number]   Influenza panel by PCR (type A & B)     Status: None   Collection Time: 07/17/16  3:27 PM  Result Value Ref Range   Influenza A By PCR NEGATIVE NEGATIVE   Influenza B By PCR NEGATIVE NEGATIVE    Comment: (NOTE) The Xpert Xpress Flu assay is intended as an aid in the diagnosis of  influenza and should not be used as a sole basis for treatment.  This  assay is FDA approved for nasopharyngeal swab specimens only. Nasal  washings and aspirates are unacceptable for Xpert Xpress Flu testing.   CK     Status: None   Collection Time: 07/17/16  3:27 PM  Result Value Ref Range   Total CK 51 49 - 397 U/L  Blood Culture (routine x 2)     Status: None   Collection Time: 07/17/16  3:31 PM  Result  Value Ref Range   Specimen Description BLOOD LEFT HAND    Special Requests      BOTTLES DRAWN AEROBIC AND ANAEROBIC Blood Culture adequate volume   Culture NO GROWTH 5 DAYS    Report Status 07/22/2016 FINAL   Urinalysis, Routine w reflex microscopic     Status: Abnormal   Collection Time: 07/17/16  3:31 PM  Result Value Ref Range   Color, Urine AMBER (A) YELLOW    Comment: BIOCHEMICALS MAY BE AFFECTED BY COLOR   APPearance CLOUDY (A) CLEAR   Specific Gravity, Urine 1.030  1.005 - 1.030   pH 8.0 5.0 - 8.0   Glucose, UA 50 (A) NEGATIVE mg/dL   Hgb urine dipstick NEGATIVE NEGATIVE   Bilirubin Urine NEGATIVE NEGATIVE   Ketones, ur NEGATIVE NEGATIVE mg/dL   Protein, ur 100 (A) NEGATIVE mg/dL   Nitrite NEGATIVE NEGATIVE   Leukocytes, UA NEGATIVE NEGATIVE   RBC / HPF TOO NUMEROUS TO COUNT 0 - 5 RBC/hpf   WBC, UA TOO NUMEROUS TO COUNT 0 - 5 WBC/hpf   Bacteria, UA NONE SEEN NONE SEEN   Squamous Epithelial / LPF 0-5 (A) NONE SEEN   WBC Clumps PRESENT    Mucous PRESENT   Protime-INR     Status: Abnormal   Collection Time: 07/17/16  4:55 PM  Result Value Ref Range   Prothrombin Time 27.0 (H) 11.4 - 15.2 seconds   INR 2.45   MRSA PCR Screening     Status: None   Collection Time: 07/17/16  6:38 PM  Result Value Ref Range   MRSA by PCR NEGATIVE NEGATIVE    Comment:        The GeneXpert MRSA Assay (FDA approved for NASAL specimens only), is one component of a comprehensive MRSA colonization surveillance program. It is not intended to diagnose MRSA infection nor to guide or monitor treatment for MRSA infections.   HIV antibody (Routine Testing)     Status: None   Collection Time: 07/18/16  5:33 AM  Result Value Ref Range   HIV Screen 4th Generation wRfx Non Reactive Non Reactive    Comment: (NOTE) Performed At: [facility] [place], [name] [id number] Lindon Romp MD [id number]   Quantiferon tb gold assay (blood)     Status: None   Collection Time: 07/18/16  5:33 AM  Result Value Ref Range   QUANTIFERON INCUBATION Comment     Comment: (NOTE) Specimen incubated at [place], [place], [name]. Performed At: [facility] [place], [name] [id number] Lindon Romp MD [id number]   [id number]     Status: Abnormal   Collection Time: 07/18/16  5:33 AM  Result Value Ref Range   Prothrombin Time 26.0 (H) 11.4 - 15.2 seconds   INR 2.33   QuantiFERON In Tube     Status: None   Collection Time:  07/18/16  5:33 AM  Result Value Ref Range   QUANTIFERON TB GOLD Negative Negative   QUANTIFERON CRITERIA Comment     Comment: (NOTE) To be considered positive a specimen should have a TB Ag minus Nil value greater than or equal to 0.35 IU/mL and in addition the TB Ag minus Nil value must be greater than or equal to 25% of the Nil value. There may be insufficient information in these values to differentiate between some negative and some indeterminate test values.    QUANTIFERON TB AG VALUE 0.09 IU/mL   Quantiferon Nil Value 0.08 IU/mL   QUANTIFERON MITOGEN VALUE  6.29 IU/mL   QFT TB AG MINUS NIL VALUE 0.01 IU/mL   Interpretation: Comment     Comment: (NOTE) The QuantiFERON TB Gold (in Tube) assay is intended for use as an aid in the diagnosis of TB infection. Negative results suggest that there is no TB infection. In patients with high suspicion of exposure, a negative test should be repeated. A positive test indicates infection with Mycobacterium tuberculosis. Among individuals without tuberculosis infection, a positive test may be due to exposure to [place], M. szulgai or M. marinum. On the Internet, go to https://figueroa-lambert.info/ for further details. Performed At: [facility] [address] [place], [name] [id number] Lindon Romp MD [id number]   ECHOCARDIOGRAM COMPLETE     Status: None   Collection Time: 07/18/16 12:55 PM  Result Value Ref Range   Weight 2,976 oz   Height 70 in   BP 128/52 mmHg  Sedimentation rate     Status: Abnormal   Collection Time: 07/18/16  4:58 PM  Result Value Ref Range   Sed Rate 107 (H) 0 - 20 mm/hr  CMV DNA, quantitative, PCR     Status: None   Collection Time: 07/18/16  4:58 PM  Result Value Ref Range   CMV DNA Quant Negative Negative IU/mL    Comment: (NOTE) No CMV DNA detected. The quantitative range of this assay is 200 to 1 million IU/mL. This test was developed and its performance characteristics determined by [facility]. It has  not been cleared or approved by the Food and Drug Administration. The FDA has determined that such clearance or approval is not necessary. Performed At: [facility] [place], [name] [id number] Lindon Romp MD [id number]    Log10 CMV Qn DNA Pl UNABLE TO CALCULATE log10 IU/mL    Comment: (NOTE) Unable to calculate result since non-numeric result obtained for component test.   Respiratory Panel by PCR     Status: None   Collection Time: 07/18/16  7:09 PM  Result Value Ref Range   Adenovirus NOT DETECTED NOT DETECTED   Coronavirus 229E NOT DETECTED NOT DETECTED   Coronavirus HKU1 NOT DETECTED NOT DETECTED   Coronavirus NL63 NOT DETECTED NOT DETECTED   Coronavirus OC43 NOT DETECTED NOT DETECTED   Metapneumovirus NOT DETECTED NOT DETECTED   Rhinovirus / Enterovirus NOT DETECTED NOT DETECTED   Influenza A NOT DETECTED NOT DETECTED   Influenza B NOT DETECTED NOT DETECTED   Parainfluenza Virus 1 NOT DETECTED NOT DETECTED   Parainfluenza Virus 2 NOT DETECTED NOT DETECTED   Parainfluenza Virus 3 NOT DETECTED NOT DETECTED   Parainfluenza Virus 4 NOT DETECTED NOT DETECTED   Respiratory Syncytial Virus NOT DETECTED NOT DETECTED   Bordetella pertussis NOT DETECTED NOT DETECTED   Chlamydophila pneumoniae NOT DETECTED NOT DETECTED   Mycoplasma pneumoniae NOT DETECTED NOT DETECTED    Comment: Performed at [facility], [address], [place],  [address]  CULTURE, BLOOD (ROUTINE X 2) w Reflex to ID Panel     Status: None   Collection Time: 07/18/16  7:56 PM  Result Value Ref Range   Specimen Description BLOOD  R HAND    Special Requests BOTTLES DRAWN AEROBIC AND ANAEROBIC  [place]    Culture NO GROWTH 5 DAYS    Report Status 07/23/2016 FINAL   CULTURE, BLOOD (ROUTINE X 2) w Reflex to ID Panel     Status: None   Collection Time: 07/18/16  8:08 PM  Result Value Ref Range  Specimen Description BLOOD  R AC    Special Requests BOTTLES DRAWN AEROBIC AND  ANAEROBIC [place]    Culture NO GROWTH 5 DAYS    Report Status 07/23/2016 FINAL   Protime-INR     Status: Abnormal   Collection Time: 07/19/16  3:57 AM  Result Value Ref Range   Prothrombin Time 29.8 (H) 11.4 - 15.2 seconds   INR 2.77   CBC     Status: Abnormal   Collection Time: 07/19/16  3:57 AM  Result Value Ref Range   WBC 6.5 3.8 - 10.6 K/uL   RBC 2.83 (L) 4.40 - 5.90 MIL/uL   Hemoglobin 9.7 (L) 13.0 - 18.0 g/dL   HCT 29.0 (L) 40.0 - 52.0 %   MCV 102.6 (H) 80.0 - 100.0 fL   MCH 34.3 (H) 26.0 - 34.0 pg   MCHC 33.5 32.0 - 36.0 g/dL   RDW 16.2 (H) 11.5 - 14.5 %   Platelets 158 150 - 440 K/uL  Basic metabolic panel     Status: Abnormal   Collection Time: 07/19/16  3:57 AM  Result Value Ref Range   Sodium 136 135 - 145 mmol/L   Potassium 4.6 3.5 - 5.1 mmol/L   Chloride 95 (L) 101 - 111 mmol/L   CO2 29 22 - 32 mmol/L   Glucose, Bld 103 (H) 65 - 99 mg/dL   BUN 57 (H) 6 - 20 mg/dL   Creatinine, Ser 9.60 (H) 0.61 - 1.24 mg/dL   Calcium 8.8 (L) 8.9 - 10.3 mg/dL   GFR calc non Af Amer 5 (L) >60 mL/min   GFR calc Af Amer 6 (L) >60 mL/min    Comment: (NOTE) The eGFR has been calculated using the CKD EPI equation. This calculation has not been validated in all clinical situations. eGFR's persistently <60 mL/min signify possible Chronic Kidney Disease.    Anion gap 12 5 - 15  Protime-INR     Status: Abnormal   Collection Time: 07/20/16  4:22 AM  Result Value Ref Range   Prothrombin Time 29.0 (H) 11.4 - 15.2 seconds   INR 2.67   HSV(herpes simplex vrs) 1+2 ab-IgG     Status: Abnormal   Collection Time: 07/21/16  3:46 AM  Result Value Ref Range   HSV 1 Glycoprotein G Ab, IgG 38.50 (H) 0.00 - 0.90 index    Comment: (NOTE)                                 Negative        <0.91                                 Equivocal 0.91 - 1.09                                 Positive        >1.09 Note: Negative indicates no antibodies detected to HSV-1. Equivocal may suggest early infection.   If clinically appropriate, retest at later date. Positive indicates antibodies detected to HSV-1.    HSV 2 Glycoprotein G Ab, IgG <0.91 0.00 - 0.90 index    Comment: (NOTE)  Negative        <0.91                                 Equivocal 0.91 - 1.09                                 Positive        >1.09 Note: Negative indicates no antibodies detected to HSV-2. Equivocal may suggest early infection.  If clinically appropriate, retest at later date. Positive indicates antibodies detected to HSV-2. Performed At: [facility] [place], [name] [id number] Lindon Romp MD [id number]   [id number]     Status: Abnormal   Collection Time: 07/21/16  3:46 AM  Result Value Ref Range   Prothrombin Time 29.3 (H) 11.4 - 15.2 seconds   INR 2.71   CBC     Status: Abnormal   Collection Time: 07/21/16  3:46 AM  Result Value Ref Range   WBC 8.0 3.8 - 10.6 K/uL   RBC 2.75 (L) 4.40 - 5.90 MIL/uL   Hemoglobin 9.1 (L) 13.0 - 18.0 g/dL   HCT 27.6 (L) 40.0 - 52.0 %   MCV 100.4 (H) 80.0 - 100.0 fL   MCH 33.3 26.0 - 34.0 pg   MCHC 33.1 32.0 - 36.0 g/dL   RDW 16.2 (H) 11.5 - 14.5 %   Platelets 194 150 - 440 K/uL  Renal function panel     Status: Abnormal   Collection Time: 07/22/16  5:00 AM  Result Value Ref Range   Sodium 135 135 - 145 mmol/L   Potassium 5.0 3.5 - 5.1 mmol/L   Chloride 96 (L) 101 - 111 mmol/L   CO2 22 22 - 32 mmol/L   Glucose, Bld 208 (H) 65 - 99 mg/dL   BUN 78 (H) 6 - 20 mg/dL   Creatinine, Ser 12.89 (H) 0.61 - 1.24 mg/dL   Calcium 9.2 8.9 - 10.3 mg/dL   Phosphorus 6.3 (H) 2.5 - 4.6 mg/dL   Albumin 2.8 (L) 3.5 - 5.0 g/dL   GFR calc non Af Amer 4 (L) >60 mL/min   GFR calc Af Amer 4 (L) >60 mL/min    Comment: (NOTE) The eGFR has been calculated using the CKD EPI equation. This calculation has not been validated in all clinical situations. eGFR's persistently <60 mL/min signify possible Chronic Kidney Disease.     Anion gap 17 (H) 5 - 15  Protime-INR     Status: Abnormal   Collection Time: 07/22/16  9:25 AM  Result Value Ref Range   Prothrombin Time 35.4 (H) 11.4 - 15.2 seconds   INR 3.43   CBC     Status: Abnormal   Collection Time: 07/22/16  9:25 AM  Result Value Ref Range   WBC 6.8 3.8 - 10.6 K/uL   RBC 2.83 (L) 4.40 - 5.90 MIL/uL   Hemoglobin 9.4 (L) 13.0 - 18.0 g/dL   HCT 28.3 (L) 40.0 - 52.0 %   MCV 100.1 (H) 80.0 - 100.0 fL   MCH 33.3 26.0 - 34.0 pg   MCHC 33.2 32.0 - 36.0 g/dL   RDW 15.9 (H) 11.5 - 14.5 %   Platelets 200 150 - 440 K/uL  POCT INR     Status: None   Collection Time: 07/24/16  8:25 AM  Result Value Ref Range   INR  5.0   POCT INR     Status: None   Collection Time: 08/01/16 12:00 AM  Result Value Ref Range   INR 1.6     Comment: pt states INR was actually 1.6 on 4/25 & 2.5 on 4/27.  POCT INR     Status: None   Collection Time: 08/04/16 12:00 AM  Result Value Ref Range   INR 6.6   CBC with Differential     Status: Abnormal   Collection Time: 08/04/16  8:31 AM  Result Value Ref Range   WBC 11.2 (H) 4.0 - 10.5 K/uL   RBC 2.97 (L) 4.22 - 5.81 MIL/uL   Hemoglobin 9.9 (L) 13.0 - 17.0 g/dL   HCT 31.3 (L) 39.0 - 52.0 %   MCV 105.4 (H) 78.0 - 100.0 fL   MCH 33.3 26.0 - 34.0 pg   MCHC 31.6 30.0 - 36.0 g/dL   RDW 16.7 (H) 11.5 - 15.5 %   Platelets 182 150 - 400 K/uL   Neutrophils Relative % 84 %   Neutro Abs 9.4 (H) 1.7 - 7.7 K/uL   Lymphocytes Relative 13 %   Lymphs Abs 1.5 0.7 - 4.0 K/uL   Monocytes Relative 3 %   Monocytes Absolute 0.3 0.1 - 1.0 K/uL   Eosinophils Relative 0 %   Eosinophils Absolute 0.0 0.0 - 0.7 K/uL   Basophils Relative 0 %   Basophils Absolute 0.0 0.0 - 0.1 K/uL  Comprehensive metabolic panel     Status: Abnormal   Collection Time: 08/04/16  8:31 AM  Result Value Ref Range   Sodium 137 135 - 145 mmol/L   Potassium 4.5 3.5 - 5.1 mmol/L   Chloride 99 (L) 101 - 111 mmol/L   CO2 23 22 - 32 mmol/L   Glucose, Bld 149 (H) 65 - 99 mg/dL    BUN 77 (H) 6 - 20 mg/dL   Creatinine, Ser 9.36 (H) 0.61 - 1.24 mg/dL   Calcium 8.5 (L) 8.9 - 10.3 mg/dL   Total Protein 7.4 6.5 - 8.1 g/dL   Albumin 3.0 (L) 3.5 - 5.0 g/dL   AST 24 15 - 41 U/L   ALT 28 17 - 63 U/L   Alkaline Phosphatase 168 (H) 38 - 126 U/L   Total Bilirubin 0.5 0.3 - 1.2 mg/dL   GFR calc non Af Amer 5 (L) >60 mL/min   GFR calc Af Amer 6 (L) >60 mL/min    Comment: (NOTE) The eGFR has been calculated using the CKD EPI equation. This calculation has not been validated in all clinical situations. eGFR's persistently <60 mL/min signify possible Chronic Kidney Disease.    Anion gap 15 5 - 15  Testosterone     Status: Abnormal   Collection Time: 08/04/16  8:31 AM  Result Value Ref Range   Testosterone 162 (L) 264 - 916 ng/dL    Comment: (NOTE) Adult male reference interval is based on a population of healthy nonobese males (BMI <30) between 1 and 32 years old. [place], [place] [phone]. PMID: [id number]. Performed At: [facility] [place], [name] [id number] Lindon Romp MD IO:[id number]   Erythropoietin     Status: Abnormal   Collection Time: 08/04/16  8:31 AM  Result Value Ref Range   Erythropoietin 281.8 (H) 2.6 - 18.5 mIU/mL    Comment: (NOTE) [employer] Uni[employer] DxI 800 Immunoassay System Performed At: [facility] [place], [name] [id number] Lindon Romp MD IW:[id number]   POCT  INR     Status: None   Collection Time: 08/06/16 12:00 AM  Result Value Ref Range   INR 3.9   Lipid panel     Status: Abnormal   Collection Time: 08/08/16  9:46 AM  Result Value Ref Range   Cholesterol 176 0 - 200 mg/dL    Comment: ATP III Classification       Desirable:  < 200 mg/dL               Borderline High:  200 - 239 mg/dL          High:  > = 240 mg/dL   Triglycerides 158.0 (H) 0.0 - 149.0 mg/dL    Comment: Normal:  <150 mg/dLBorderline High:  150 - 199 mg/dL   HDL 50.50 >39.00 mg/dL   VLDL 31.6  0.0 - 40.0 mg/dL   LDL Cholesterol 94 0 - 99 mg/dL   Total CHOL/HDL Ratio 3     Comment:                Men          Women1/2 Average Risk     3.4          3.3Average Risk          5.0          4.42X Average Risk          9.6          7.13X Average Risk          15.0          11.0                       NonHDL 125.77     Comment: NOTE:  Non-HDL goal should be 30 mg/dL higher than patient's LDL goal (i.e. LDL goal of < 70 mg/dL, would have non-HDL goal of < 100 mg/dL)  Hemoglobin A1c     Status: None   Collection Time: 08/08/16  9:46 AM  Result Value Ref Range   Hgb A1c MFr Bld 5.2 4.6 - 6.5 %    Comment: Glycemic Control Guidelines for People with Diabetes:Non Diabetic:  <6%Goal of Therapy: <7%Additional Action Suggested:  >8%   POCT INR     Status: None   Collection Time: 08/11/16  9:37 AM  Result Value Ref Range   INR 1.6   POCT INR     Status: None   Collection Time: 08/14/16 12:00 AM  Result Value Ref Range   INR 2.3   POCT INR     Status: None   Collection Time: 08/19/16 12:00 AM  Result Value Ref Range   INR 3.3   POCT INR     Status: None   Collection Time: 08/21/16 10:29 AM  Result Value Ref Range   INR 2.0   POCT INR     Status: None   Collection Time: 08/25/16 12:00 AM  Result Value Ref Range   INR 3.6   Testosterone     Status: Abnormal   Collection Time: 08/25/16  2:00 PM  Result Value Ref Range   Testosterone 159 (L) 264 - 916 ng/dL    Comment: (NOTE) Adult male reference interval is based on a population of healthy nonobese males (BMI <30) between 41 and 44 years old. [place], [place] [phone]. PMID: [id number]. Performed At: [facility] [place], [name] [id number] Lindon Romp MD CL:[id number]   POCT INR  Status: None   Collection Time: 08/29/16 12:00 AM  Result Value Ref Range   INR 3.1   POCT INR     Status: None   Collection Time: 09/02/16  4:05 PM  Result Value Ref Range   INR 1.9   POCT INR     Status:  None   Collection Time: 09/08/16  9:33 PM  Result Value Ref Range   INR 2.9   POCT INR     Status: None   Collection Time: 09/11/16  8:55 AM  Result Value Ref Range   INR 2.0   POCT INR     Status: None   Collection Time: 09/16/16  7:13 AM  Result Value Ref Range   INR 1.5   POCT INR     Status: None   Collection Time: 09/19/16 12:00 AM  Result Value Ref Range   INR 3.2     Assessment/Plan:  Essential hypertension blood pressure control important in reducing the progression of atherosclerotic disease. On appropriate oral medications.   CAD (coronary artery disease) Status post bypass and doing well.  Ventral hernia The patient has an upper abdominal hernia after previous abdominal surgery. We discussed that having this repaired prior to his peritoneal dialysis catheter placement would likely be best. We generally do not place these same time as the catheter for infectious reasons. I have told him that although I used to do these, I have not done a hernia repair in 5-7 years. He would like to have this fixed locally and I will refer him to a local general surgeon to have this fixed. Once this has healed, we can place his peritoneal dialysis catheter.  ESRD on dialysis [facility]) The patient desires to switch over to peritoneal dialysis which is certainly reasonable. He understands his multiple previous abdominal operations may limit the effectiveness of peritoneal dialysis and he will keep his fistula as a backup option. We discussed having his ventral hernia fixed prior to peritoneal dialysis catheter placement. He is also aware that if prohibitive scar tissue is found at the time of abdominal exploration, no catheter would be placed. He would like to give it a chance because he much prefers peritoneal dialysis over hemodialysis and this is reasonable. I will see him back after his ventral hernia repair.      Leotis Pain 10/14/2016, 10:06 AM   This note was created with Dragon  medical transcription system.  Any errors from dictation are unintentional.

## 2016-10-14 NOTE — Patient Instructions (Signed)
Peritoneal Dialysis Dialysis is a procedure that does some of the work healthy kidneys do. Peritoneal dialysis is a kind of dialysis that uses the thin lining of the belly (peritoneum) and a fluid called dialysate to remove wastes, salt, and extra water from the blood. Before beginning peritoneal dialysis you will have surgery to place a thin, plastic tube (catheter) in your belly. The tube will be small, soft, and easy to hide. It will be used to fill your belly with fluid and drain your belly of fluid. How does it work? At the start of the session your belly will be filled with fluid. Wastes, salt, and extra water in your blood will pass through the thin lining of your belly into the fluid. At the end of the session the fluid will be drained into a bag. Then the belly will be refilled with fluid and the procedure will be repeated. The process of draining fluid from the belly and filling the belly with fluid is called an exchange. The time the fluid stays in your body before you drain it is called a dwell. The dwell varies from person to person. It usually takes from 1.5-3 hours. Peritoneal dialysis is done during the day or at night while you are sleeping. When it is done during the day it is called continuous ambulatory peritoneal dialysis (CAPD). In CAPD, exchanges are done up to 5 times a day. Each exchange takes about 30-40 minutes. You may go about your day normally between exchanges. Peritoneal dialysis that is done at night is called continuous cycling peritoneal dialysis (CCPD). In CCPD, a machine called a cycler performs exchanges while you are sleeping. Sometimes a combination of CAPD and CCPD is needed. Preparing for an exchange  Check your blood pressure if told by your doctor.  Warm the fluid bag using a dry heating pad. Leave the fluid in the bag with the cover on while doing this. Do not use a microwave or hot water to warm the fluid bag.  If you are using a machine, make sure it is  set up and programmed correctly.  Keep your tubing free of germs (sterile). ? Make sure the tube in your belly and its cap are free of germs. ? If you are using a machine, make sure the part that attaches the bag and tubing to the the tube in your belly is free of germs. ? Make sure the area around the tube in your belly is free of germs.  Prevent new germs from getting on your tubing. ? Wash your hands thoroughly. Use a gel or foam. ? Close doors and windows. Turn off any fans. Make sure you are in a space without drafts or air currents. Doing these things reduces the chance of infection. ? Wear a mask to cover your nose and mouth. Do this before you wash your hands and touch equipment. Keep the mask on during each exchange.  Examine the fluid bag: ? Make sure the bag is the right size. Size information is on the label. Make sure it has the correct liquid. ? Gently squeeze the bag to make sure there are no leaks. ? Look at the color of the fluid. The fluid should be clear. You should be able to see any writing on the side of the bag clearly through the liquid. Do not use it if it is cloudy. ? Check the expiration date. The expiration date is on the label. If it is past the expiration date, throw the  bag away. How to do an exchange These steps show how exchanges are commonly done. The actual way in which an exchange should be done varies from person to person. Always do exchanges exactly the way that your doctor has trained you to. Remember to always wash your hands before touching any tubing. This is very important. It helps prevent infection. CAPD 1. Hang your fluid bag above your belly on the IV pole. 2. Place a drain bag below your belly. 3. Pull out the ring of y-shaped tubing that connects both bags. 4. Uncap the tube that is connected to the tube in your belly (transfer set). Immediately attach it to the y-shaped tubing of the fluid and drain bags. 5. Twist open the clamp on the  transfer set. This will cause the fluid in your belly to drain through the tube that goes to the drain bag (drain line). It usually takes about 20 minutes for all of the fluid to drain. 6. Once the fluid has finished draining, twist lock the clamp of the transfer set. Then clamp the drain line. 7. Break the seal (frangible) on the tubing that is connected to the fluid bag (fill line). Then unclamp the drain line. Air bubbles will flow into the drain line. Make sure your transfer set is still locked so that no air gets into the belly. 8. Count to 5. Then clamp the drain line. 9. Twist the transfer set clamp to open it. Fluid will flow into your belly. This should take about 10 minutes. 10. When this is done, twist the transfer set clamp to lock it. Then clamp the tubing of the fill line. 11. Detach the tubing from the tube in your belly. Cap the transfer set right away. Tape the transfer set to your belly as told. 12. Look at the fluid that has drained. It may look like urine, but it should be clear. 13. Weigh the drain bag. Write down how much was drained. 14. Check your blood pressure, temperature, and pulse if it is the first exchange of the day. 15. Allow the fluid to stay in the belly for as long as told by your doctor. You may go about your day while the fluid is in your belly.  If your body cannot go all night without an exchange, a machine may be used to perform exchanges while you sleep. The machine is similar to the one used during CCPD. CCPD 1. When you are ready for bed, put the fluid bags onto the machine. Put on exactly the number of bags that your doctor said to use. 2. The machine has a small cartridge (cassette) with tubing that attaches to each fluid bag and the tube that goes to the patient (patient line). Depending on the number of fluid bags you are to use, all the tubes on the cassette may need to be connected to separate fluid bags. If you use fewer bags than there are tubes on  the cassette, the extra lines will need to be clamped. 3. Insert the cassette with the attached tubing into the front of the machine. 4. Make sure that the drain line extends to the toilet. The tip of the drain line should not touch the water in the toilet bowl. 5. Pull the ring on the tubing from the fluid bag on the heating pad of the machine and attach it to the first tube of the cassette. Then break the seal on the tubing of this fluid bag. 6. Repeat this process  with all the bags you need to use. 7. Start the machine. This will prepare (prime) the machine and tubing by filling it with fluid and getting rid of all the air in the tubes. 8. Once all lines are prepared, the machine will tell you to connect yourself. 9. Remove the pull ring from the patient line with one hand. 10. Uncap your transfer set and attach it to the patient line. 11. Twist open the clamp on the transfer set. 12. Press "GO" on the machine. It will begin the draining and filling process. The machine may do 3-5 exchanges overnight, depending on what your doctor recommended. 13. In the morning, record all volumes and times shown on the machine. 14. Press "GO" on the machine. The machine will then tell you to close all clamps. 15. Close your transfer set twist clamp and then clamp all the tubes that go to the machine. 16. When told by the machine, disconnect the patient line from the transfer set. Re-cap your transfer set right away. 17. Tape the transfer set to your belly as told. 18. Check your blood pressure, temperature, and pulse.  The fluid that is in your belly in the morning will stay there during the day. If the body cannot go all day without an exchange, you may need to perform an exchange during the day. Follow the steps under CAPD if an extra exchange is needed.  Follow these instructions at home:  Follow diet instructions as told by your doctor.  Always keep the fluid bags and other supplies in a cool, clean,  and dry place.  Keep a strict schedule. Dialysis must be done every day. Do not skip a day or an exchange. Make sure to make time for each exchange.  Weigh yourself every day. Sudden weight gain may be a sign of a problem.  Take medicines as told by your doctor.  Avoid becoming constipated. Constipation prevents fluid from draining well. To prevent constipation: ? Make sure you eat fiber-rich foods. ? Avoid foods that cause constipation. ? Increase your physical activity. ? Go to the restroom when you feel you need to instead of holding it in. ? Take drugs such as laxatives if told by your doctor. Only take them as told. Get help if:  You have a fever or chills.  You feel sick to your stomach (nauseous) or throw up (vomit).  You have diarrhea.  You have any problems with an exchange.  Your blood pressure increases.  You suddenly gain weight or feel short of breath.  The tube in your belly seems loose or feels like it is coming out.  The fluid that has drained from your belly is pinkish or reddish. Women having their menstrual period do not need to get help if the fluid is only a little pink or red.  There are white strands in the fluid that are large enough to get stuck in your tubing. Get help right away if:  The area around the tube in your belly swells or becomes red, tender, or painful.  There is pus coming from the area around the tube in your belly.  The fluid that has drained from your belly is cloudy.  You have belly pain or discomfort. This information is not intended to replace advice given to you by your health care provider. Make sure you discuss any questions you have with your health care provider. Document Released: 04/26/2010 Document Revised: 08/30/2015 Document Reviewed: 09/20/2012 Elsevier Interactive Patient Education  2018  [employer].

## 2016-10-15 DIAGNOSIS — N186 End stage renal disease: Secondary | ICD-10-CM | POA: Diagnosis not present

## 2016-10-15 DIAGNOSIS — N2581 Secondary hyperparathyroidism of renal origin: Secondary | ICD-10-CM | POA: Diagnosis not present

## 2016-10-15 DIAGNOSIS — Z23 Encounter for immunization: Secondary | ICD-10-CM | POA: Diagnosis not present

## 2016-10-15 DIAGNOSIS — Z992 Dependence on renal dialysis: Secondary | ICD-10-CM | POA: Diagnosis not present

## 2016-10-15 DIAGNOSIS — D631 Anemia in chronic kidney disease: Secondary | ICD-10-CM | POA: Diagnosis not present

## 2016-10-17 ENCOUNTER — Telehealth: Payer: Self-pay

## 2016-10-17 DIAGNOSIS — D631 Anemia in chronic kidney disease: Secondary | ICD-10-CM | POA: Diagnosis not present

## 2016-10-17 DIAGNOSIS — N186 End stage renal disease: Secondary | ICD-10-CM | POA: Diagnosis not present

## 2016-10-17 DIAGNOSIS — Z992 Dependence on renal dialysis: Secondary | ICD-10-CM | POA: Diagnosis not present

## 2016-10-17 DIAGNOSIS — Z23 Encounter for immunization: Secondary | ICD-10-CM | POA: Diagnosis not present

## 2016-10-17 DIAGNOSIS — N2581 Secondary hyperparathyroidism of renal origin: Secondary | ICD-10-CM | POA: Diagnosis not present

## 2016-10-17 NOTE — Telephone Encounter (Signed)
Patient advised of below he states per MD INR requires him to check every week.

## 2016-10-17 NOTE — Telephone Encounter (Signed)
-----   Message from Coral Spikes, DO sent at 10/17/2016  7:43 AM EDT ----- INR at goal (MD INR). Continue current dosing. Recheck in 2 weeks.  [place]

## 2016-10-20 DIAGNOSIS — N186 End stage renal disease: Secondary | ICD-10-CM | POA: Diagnosis not present

## 2016-10-20 DIAGNOSIS — Z23 Encounter for immunization: Secondary | ICD-10-CM | POA: Diagnosis not present

## 2016-10-20 DIAGNOSIS — Z992 Dependence on renal dialysis: Secondary | ICD-10-CM | POA: Diagnosis not present

## 2016-10-20 DIAGNOSIS — N2581 Secondary hyperparathyroidism of renal origin: Secondary | ICD-10-CM | POA: Diagnosis not present

## 2016-10-20 DIAGNOSIS — D631 Anemia in chronic kidney disease: Secondary | ICD-10-CM | POA: Diagnosis not present

## 2016-10-21 ENCOUNTER — Encounter (HOSPITAL_COMMUNITY): Payer: Self-pay

## 2016-10-21 ENCOUNTER — Encounter (HOSPITAL_COMMUNITY): Payer: Medicare Other | Attending: Hematology & Oncology

## 2016-10-21 VITALS — BP 148/70 | HR 106 | Temp 98.5°F | Resp 16

## 2016-10-21 DIAGNOSIS — Z952 Presence of prosthetic heart valve: Secondary | ICD-10-CM | POA: Insufficient documentation

## 2016-10-21 DIAGNOSIS — E291 Testicular hypofunction: Secondary | ICD-10-CM

## 2016-10-21 DIAGNOSIS — E785 Hyperlipidemia, unspecified: Secondary | ICD-10-CM | POA: Insufficient documentation

## 2016-10-21 DIAGNOSIS — N186 End stage renal disease: Secondary | ICD-10-CM

## 2016-10-21 DIAGNOSIS — N183 Chronic kidney disease, stage 3 unspecified: Secondary | ICD-10-CM

## 2016-10-21 DIAGNOSIS — Z940 Kidney transplant status: Secondary | ICD-10-CM | POA: Insufficient documentation

## 2016-10-21 DIAGNOSIS — Z9889 Other specified postprocedural states: Secondary | ICD-10-CM | POA: Insufficient documentation

## 2016-10-21 DIAGNOSIS — G4733 Obstructive sleep apnea (adult) (pediatric): Secondary | ICD-10-CM | POA: Insufficient documentation

## 2016-10-21 DIAGNOSIS — Z79899 Other long term (current) drug therapy: Secondary | ICD-10-CM | POA: Insufficient documentation

## 2016-10-21 DIAGNOSIS — D631 Anemia in chronic kidney disease: Secondary | ICD-10-CM

## 2016-10-21 DIAGNOSIS — Z992 Dependence on renal dialysis: Secondary | ICD-10-CM | POA: Insufficient documentation

## 2016-10-21 DIAGNOSIS — M1990 Unspecified osteoarthritis, unspecified site: Secondary | ICD-10-CM | POA: Insufficient documentation

## 2016-10-21 DIAGNOSIS — Z85528 Personal history of other malignant neoplasm of kidney: Secondary | ICD-10-CM | POA: Insufficient documentation

## 2016-10-21 DIAGNOSIS — N189 Chronic kidney disease, unspecified: Secondary | ICD-10-CM | POA: Insufficient documentation

## 2016-10-21 DIAGNOSIS — D649 Anemia, unspecified: Secondary | ICD-10-CM | POA: Insufficient documentation

## 2016-10-21 DIAGNOSIS — Z7901 Long term (current) use of anticoagulants: Secondary | ICD-10-CM | POA: Insufficient documentation

## 2016-10-21 DIAGNOSIS — I129 Hypertensive chronic kidney disease with stage 1 through stage 4 chronic kidney disease, or unspecified chronic kidney disease: Secondary | ICD-10-CM | POA: Insufficient documentation

## 2016-10-21 LAB — POCT INR: INR: 1.6 — AB (ref 0.9–1.1)

## 2016-10-21 MED ORDER — TESTOSTERONE CYPIONATE 200 MG/ML IM SOLN
200.0000 mg | INTRAMUSCULAR | Status: DC
  Administered 2016-10-21: 200 mg via INTRAMUSCULAR

## 2016-10-21 MED ORDER — TESTOSTERONE CYPIONATE 200 MG/ML IM SOLN
INTRAMUSCULAR | Status: AC
  Filled 2016-10-21: qty 1

## 2016-10-21 NOTE — Patient Instructions (Signed)
[place] at [facility] Discharge Instructions  RECOMMENDATIONS MADE BY THE CONSULTANT AND ANY TEST RESULTS WILL BE SENT TO YOUR REFERRING PHYSICIAN.  You were given your testosterone injection today. Follow up in 28 days for another injection.  Thank you for choosing [place] at [facility] to provide your oncology and hematology care.  To afford each patient quality time with our provider, please arrive at least 15 minutes before your scheduled appointment time.    If you have a lab appointment with the [facility] please come in thru the  Main Entrance and check in at the main information desk  You need to re-schedule your appointment should you arrive 10 or more minutes late.  We strive to give you quality time with our providers, and arriving late affects you and other patients whose appointments are after yours.  Also, if you no show three or more times for appointments you may be dismissed from the clinic at the providers discretion.     Again, thank you for choosing [facility].  Our hope is that these requests will decrease the amount of time that you wait before being seen by our physicians.       _____________________________________________________________  Should you have questions after your visit to [facility], please contact our office at [phone] between the hours of 8:30 a.m. and 4:30 p.m.  Voicemails left after 4:30 p.m. will not be returned until the following business day.  For prescription refill requests, have your pharmacy contact our office.       Resources For Cancer Patients and their Caregivers ? American Cancer Society: Can assist with transportation, wigs, general needs, runs Look Good Feel Better.        248-346-1680 ? Cancer Care: Provides financial assistance, online support groups, medication/co-pay assistance.  1-800-813-HOPE 229-533-7905) ? [facility] Assists [place] cancer patients and their families through emotional , educational and financial support.  (223) 019-0593 ? [place] DSS Where to apply for food stamps, Medicaid and utility assistance. 765-831-2675 ? [employer]: Transportation to medical appointments. 2046013218 ? Social Security Administration: May apply for disability if have a Stage IV cancer. (234)443-7815 815-239-0884 ? [employer], Disability and Transit Services: Assists with nutrition, care and transit needs. [place] Support Programs: @10RELATIVEDAYS @ > Cancer Support Group  2nd Tuesday of the month 1pm-2pm, Journey Room  > Creative Journey  3rd Tuesday of the month 1130am-1pm, Journey Room  > Look Good Feel Better  1st Wednesday of the month 10am-12 noon, Journey Room (Call [place] to register [phone])

## 2016-10-21 NOTE — Progress Notes (Signed)
Patient reports that his kidney has failed and he continues to have dialysis on MWF. He reports that he has had increased fatigue and no energy. His Hgb he reports continues to fluctuate. Patient also reports that he has a new onset of A-fib which he is seeing a cardiologist for tomorrow. Patient denies needing anything at this time from our clinic.    Jeremy [name] presents today for injection per MD orders. Testosterone 200 mg administered IM in right Gluteal. Administration without incident. Patient tolerated well.  Patient discharged ambulatory and in stable condition from clinic.  Follow up as scheduled.

## 2016-10-22 DIAGNOSIS — I4891 Unspecified atrial fibrillation: Secondary | ICD-10-CM | POA: Diagnosis not present

## 2016-10-22 DIAGNOSIS — I350 Nonrheumatic aortic (valve) stenosis: Secondary | ICD-10-CM | POA: Diagnosis not present

## 2016-10-22 DIAGNOSIS — Z23 Encounter for immunization: Secondary | ICD-10-CM | POA: Diagnosis not present

## 2016-10-22 DIAGNOSIS — Z7901 Long term (current) use of anticoagulants: Secondary | ICD-10-CM | POA: Diagnosis not present

## 2016-10-22 DIAGNOSIS — N2581 Secondary hyperparathyroidism of renal origin: Secondary | ICD-10-CM | POA: Diagnosis not present

## 2016-10-22 DIAGNOSIS — Z905 Acquired absence of kidney: Secondary | ICD-10-CM | POA: Diagnosis not present

## 2016-10-22 DIAGNOSIS — D649 Anemia, unspecified: Secondary | ICD-10-CM | POA: Diagnosis not present

## 2016-10-22 DIAGNOSIS — I120 Hypertensive chronic kidney disease with stage 5 chronic kidney disease or end stage renal disease: Secondary | ICD-10-CM | POA: Diagnosis not present

## 2016-10-22 DIAGNOSIS — D631 Anemia in chronic kidney disease: Secondary | ICD-10-CM | POA: Diagnosis not present

## 2016-10-22 DIAGNOSIS — N186 End stage renal disease: Secondary | ICD-10-CM | POA: Diagnosis not present

## 2016-10-22 DIAGNOSIS — Z952 Presence of prosthetic heart valve: Secondary | ICD-10-CM | POA: Diagnosis not present

## 2016-10-22 DIAGNOSIS — Z8679 Personal history of other diseases of the circulatory system: Secondary | ICD-10-CM | POA: Diagnosis not present

## 2016-10-22 DIAGNOSIS — I2510 Atherosclerotic heart disease of native coronary artery without angina pectoris: Secondary | ICD-10-CM | POA: Diagnosis not present

## 2016-10-22 DIAGNOSIS — Z992 Dependence on renal dialysis: Secondary | ICD-10-CM | POA: Diagnosis not present

## 2016-10-22 DIAGNOSIS — I4510 Unspecified right bundle-branch block: Secondary | ICD-10-CM | POA: Diagnosis not present

## 2016-10-23 DIAGNOSIS — N186 End stage renal disease: Secondary | ICD-10-CM | POA: Diagnosis not present

## 2016-10-23 DIAGNOSIS — Z992 Dependence on renal dialysis: Secondary | ICD-10-CM | POA: Diagnosis not present

## 2016-10-23 DIAGNOSIS — Z940 Kidney transplant status: Secondary | ICD-10-CM | POA: Diagnosis not present

## 2016-10-23 DIAGNOSIS — I2510 Atherosclerotic heart disease of native coronary artery without angina pectoris: Secondary | ICD-10-CM | POA: Diagnosis not present

## 2016-10-23 DIAGNOSIS — I4891 Unspecified atrial fibrillation: Secondary | ICD-10-CM | POA: Diagnosis not present

## 2016-10-23 DIAGNOSIS — D899 Disorder involving the immune mechanism, unspecified: Secondary | ICD-10-CM | POA: Diagnosis not present

## 2016-10-23 DIAGNOSIS — D8989 Other specified disorders involving the immune mechanism, not elsewhere classified: Secondary | ICD-10-CM | POA: Diagnosis not present

## 2016-10-23 DIAGNOSIS — D631 Anemia in chronic kidney disease: Secondary | ICD-10-CM | POA: Diagnosis not present

## 2016-10-23 DIAGNOSIS — I120 Hypertensive chronic kidney disease with stage 5 chronic kidney disease or end stage renal disease: Secondary | ICD-10-CM | POA: Diagnosis not present

## 2016-10-23 DIAGNOSIS — R05 Cough: Secondary | ICD-10-CM | POA: Diagnosis not present

## 2016-10-23 DIAGNOSIS — Z955 Presence of coronary angioplasty implant and graft: Secondary | ICD-10-CM | POA: Diagnosis not present

## 2016-10-23 DIAGNOSIS — R609 Edema, unspecified: Secondary | ICD-10-CM | POA: Diagnosis not present

## 2016-10-23 DIAGNOSIS — Z79899 Other long term (current) drug therapy: Secondary | ICD-10-CM | POA: Diagnosis not present

## 2016-10-23 DIAGNOSIS — I10 Essential (primary) hypertension: Secondary | ICD-10-CM | POA: Diagnosis not present

## 2016-10-23 DIAGNOSIS — Z7901 Long term (current) use of anticoagulants: Secondary | ICD-10-CM | POA: Diagnosis not present

## 2016-10-23 DIAGNOSIS — E785 Hyperlipidemia, unspecified: Secondary | ICD-10-CM | POA: Diagnosis not present

## 2016-10-23 DIAGNOSIS — T8612 Kidney transplant failure: Secondary | ICD-10-CM | POA: Diagnosis not present

## 2016-10-23 DIAGNOSIS — Z952 Presence of prosthetic heart valve: Secondary | ICD-10-CM | POA: Diagnosis not present

## 2016-10-23 DIAGNOSIS — Z4822 Encounter for aftercare following kidney transplant: Secondary | ICD-10-CM | POA: Diagnosis not present

## 2016-10-23 DIAGNOSIS — Z888 Allergy status to other drugs, medicaments and biological substances status: Secondary | ICD-10-CM | POA: Diagnosis not present

## 2016-10-23 DIAGNOSIS — Z951 Presence of aortocoronary bypass graft: Secondary | ICD-10-CM | POA: Diagnosis not present

## 2016-10-23 DIAGNOSIS — R509 Fever, unspecified: Secondary | ICD-10-CM | POA: Diagnosis not present

## 2016-10-24 DIAGNOSIS — Z23 Encounter for immunization: Secondary | ICD-10-CM | POA: Diagnosis not present

## 2016-10-24 DIAGNOSIS — N186 End stage renal disease: Secondary | ICD-10-CM | POA: Diagnosis not present

## 2016-10-24 DIAGNOSIS — D631 Anemia in chronic kidney disease: Secondary | ICD-10-CM | POA: Diagnosis not present

## 2016-10-24 DIAGNOSIS — N2581 Secondary hyperparathyroidism of renal origin: Secondary | ICD-10-CM | POA: Diagnosis not present

## 2016-10-24 DIAGNOSIS — Z992 Dependence on renal dialysis: Secondary | ICD-10-CM | POA: Diagnosis not present

## 2016-10-27 ENCOUNTER — Ambulatory Visit (HOSPITAL_COMMUNITY): Payer: Medicare Other

## 2016-10-27 ENCOUNTER — Other Ambulatory Visit (HOSPITAL_COMMUNITY): Payer: Medicare Other

## 2016-10-27 DIAGNOSIS — D631 Anemia in chronic kidney disease: Secondary | ICD-10-CM | POA: Diagnosis not present

## 2016-10-27 DIAGNOSIS — N186 End stage renal disease: Secondary | ICD-10-CM | POA: Diagnosis not present

## 2016-10-27 DIAGNOSIS — Z23 Encounter for immunization: Secondary | ICD-10-CM | POA: Diagnosis not present

## 2016-10-27 DIAGNOSIS — Z992 Dependence on renal dialysis: Secondary | ICD-10-CM | POA: Diagnosis not present

## 2016-10-27 DIAGNOSIS — N2581 Secondary hyperparathyroidism of renal origin: Secondary | ICD-10-CM | POA: Diagnosis not present

## 2016-10-28 ENCOUNTER — Ambulatory Visit (HOSPITAL_COMMUNITY): Payer: Medicare Other

## 2016-10-28 ENCOUNTER — Other Ambulatory Visit (HOSPITAL_COMMUNITY): Payer: Medicare Other

## 2016-10-28 LAB — POCT INR
INR: 1.8
INR: 1.8
INR: 1.8

## 2016-10-29 DIAGNOSIS — D631 Anemia in chronic kidney disease: Secondary | ICD-10-CM | POA: Diagnosis not present

## 2016-10-29 DIAGNOSIS — Z23 Encounter for immunization: Secondary | ICD-10-CM | POA: Diagnosis not present

## 2016-10-29 DIAGNOSIS — Z992 Dependence on renal dialysis: Secondary | ICD-10-CM | POA: Diagnosis not present

## 2016-10-29 DIAGNOSIS — N2581 Secondary hyperparathyroidism of renal origin: Secondary | ICD-10-CM | POA: Diagnosis not present

## 2016-10-29 DIAGNOSIS — N186 End stage renal disease: Secondary | ICD-10-CM | POA: Diagnosis not present

## 2016-10-30 ENCOUNTER — Ambulatory Visit: Payer: Self-pay | Admitting: Family Medicine

## 2016-10-30 DIAGNOSIS — I471 Supraventricular tachycardia: Secondary | ICD-10-CM | POA: Insufficient documentation

## 2016-10-30 DIAGNOSIS — Z952 Presence of prosthetic heart valve: Secondary | ICD-10-CM

## 2016-10-30 DIAGNOSIS — I4891 Unspecified atrial fibrillation: Secondary | ICD-10-CM

## 2016-10-30 LAB — POCT INR: INR: 1.8

## 2016-10-31 DIAGNOSIS — D631 Anemia in chronic kidney disease: Secondary | ICD-10-CM | POA: Diagnosis not present

## 2016-10-31 DIAGNOSIS — N186 End stage renal disease: Secondary | ICD-10-CM | POA: Diagnosis not present

## 2016-10-31 DIAGNOSIS — Z23 Encounter for immunization: Secondary | ICD-10-CM | POA: Diagnosis not present

## 2016-10-31 DIAGNOSIS — Z992 Dependence on renal dialysis: Secondary | ICD-10-CM | POA: Diagnosis not present

## 2016-10-31 DIAGNOSIS — N2581 Secondary hyperparathyroidism of renal origin: Secondary | ICD-10-CM | POA: Diagnosis not present

## 2016-11-03 DIAGNOSIS — Z23 Encounter for immunization: Secondary | ICD-10-CM | POA: Diagnosis not present

## 2016-11-03 DIAGNOSIS — D631 Anemia in chronic kidney disease: Secondary | ICD-10-CM | POA: Diagnosis not present

## 2016-11-03 DIAGNOSIS — N186 End stage renal disease: Secondary | ICD-10-CM | POA: Diagnosis not present

## 2016-11-03 DIAGNOSIS — N2581 Secondary hyperparathyroidism of renal origin: Secondary | ICD-10-CM | POA: Diagnosis not present

## 2016-11-03 DIAGNOSIS — Z992 Dependence on renal dialysis: Secondary | ICD-10-CM | POA: Diagnosis not present

## 2016-11-04 ENCOUNTER — Ambulatory Visit: Payer: Self-pay | Admitting: Pharmacist

## 2016-11-04 DIAGNOSIS — I482 Chronic atrial fibrillation: Secondary | ICD-10-CM | POA: Diagnosis not present

## 2016-11-04 DIAGNOSIS — T8610 Unspecified complication of kidney transplant: Secondary | ICD-10-CM | POA: Diagnosis not present

## 2016-11-04 DIAGNOSIS — N186 End stage renal disease: Secondary | ICD-10-CM | POA: Diagnosis not present

## 2016-11-04 DIAGNOSIS — Z952 Presence of prosthetic heart valve: Secondary | ICD-10-CM

## 2016-11-04 DIAGNOSIS — Z992 Dependence on renal dialysis: Secondary | ICD-10-CM | POA: Diagnosis not present

## 2016-11-04 DIAGNOSIS — I4891 Unspecified atrial fibrillation: Secondary | ICD-10-CM

## 2016-11-04 LAB — POCT INR: INR: 1.9

## 2016-11-04 MED ORDER — WARFARIN SODIUM 1 MG PO TABS: ORAL_TABLET | ORAL | 3 refills | Status: DC

## 2016-11-05 DIAGNOSIS — N186 End stage renal disease: Secondary | ICD-10-CM | POA: Diagnosis not present

## 2016-11-05 DIAGNOSIS — Z23 Encounter for immunization: Secondary | ICD-10-CM | POA: Diagnosis not present

## 2016-11-05 DIAGNOSIS — N2581 Secondary hyperparathyroidism of renal origin: Secondary | ICD-10-CM | POA: Diagnosis not present

## 2016-11-05 DIAGNOSIS — D631 Anemia in chronic kidney disease: Secondary | ICD-10-CM | POA: Diagnosis not present

## 2016-11-05 DIAGNOSIS — Z992 Dependence on renal dialysis: Secondary | ICD-10-CM | POA: Diagnosis not present

## 2016-11-07 DIAGNOSIS — D631 Anemia in chronic kidney disease: Secondary | ICD-10-CM | POA: Diagnosis not present

## 2016-11-07 DIAGNOSIS — Z23 Encounter for immunization: Secondary | ICD-10-CM | POA: Diagnosis not present

## 2016-11-07 DIAGNOSIS — Z992 Dependence on renal dialysis: Secondary | ICD-10-CM | POA: Diagnosis not present

## 2016-11-07 DIAGNOSIS — N186 End stage renal disease: Secondary | ICD-10-CM | POA: Diagnosis not present

## 2016-11-07 DIAGNOSIS — N2581 Secondary hyperparathyroidism of renal origin: Secondary | ICD-10-CM | POA: Diagnosis not present

## 2016-11-10 ENCOUNTER — Telehealth: Payer: Self-pay | Admitting: Radiology

## 2016-11-10 ENCOUNTER — Ambulatory Visit: Payer: Self-pay | Admitting: Pharmacist

## 2016-11-10 DIAGNOSIS — N2581 Secondary hyperparathyroidism of renal origin: Secondary | ICD-10-CM | POA: Diagnosis not present

## 2016-11-10 DIAGNOSIS — Z992 Dependence on renal dialysis: Secondary | ICD-10-CM | POA: Diagnosis not present

## 2016-11-10 DIAGNOSIS — N186 End stage renal disease: Secondary | ICD-10-CM | POA: Diagnosis not present

## 2016-11-10 DIAGNOSIS — I4891 Unspecified atrial fibrillation: Secondary | ICD-10-CM

## 2016-11-10 DIAGNOSIS — Z23 Encounter for immunization: Secondary | ICD-10-CM | POA: Diagnosis not present

## 2016-11-10 DIAGNOSIS — Z952 Presence of prosthetic heart valve: Secondary | ICD-10-CM

## 2016-11-10 DIAGNOSIS — D631 Anemia in chronic kidney disease: Secondary | ICD-10-CM | POA: Diagnosis not present

## 2016-11-10 LAB — POCT INR: INR: 3.7

## 2016-11-10 MED ORDER — WARFARIN SODIUM 1 MG PO TABS: ORAL_TABLET | ORAL | 3 refills | Status: DC

## 2016-11-10 NOTE — Telephone Encounter (Signed)
Debbie from [facility] INR called and stated that the pt INR is 3.7.

## 2016-11-11 ENCOUNTER — Ambulatory Visit (INDEPENDENT_AMBULATORY_CARE_PROVIDER_SITE_OTHER): Payer: Medicare Other | Admitting: General Surgery

## 2016-11-11 ENCOUNTER — Encounter: Payer: Self-pay | Admitting: General Surgery

## 2016-11-11 VITALS — BP 114/62 | HR 88 | Resp 14 | Ht 70.0 in | Wt 188.0 lb

## 2016-11-11 DIAGNOSIS — I2510 Atherosclerotic heart disease of native coronary artery without angina pectoris: Secondary | ICD-10-CM | POA: Diagnosis not present

## 2016-11-11 DIAGNOSIS — K439 Ventral hernia without obstruction or gangrene: Secondary | ICD-10-CM | POA: Diagnosis not present

## 2016-11-11 NOTE — Progress Notes (Signed)
Patient ID: Jeremy [name], male   DOB: 1956-01-24, 61 y.o.   MRN: [id number]  Chief Complaint  Patient presents with  . Other    HPI Jeremy [name] is a 61 y.o. male. Here today for a evaluation of a umbilical hernia. Patient states he noticed that area about four years ago. He states in the last year the area has got bigger. No pain . Last ct scan don eon 07/18/2016.   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 He subsequently underwent removal of both of his native kidneys.  He received a new transplant about 4-5 years ago. 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 He found that hemodialysis producing profound fatigue. He is looking to start peritoneal dialysis.  He's been maintained on steroids for management of chronic rejection symptoms present even status post embolization of the kidney.  The patient has no GI symptoms associated with the epigastric hernia for which she is seen today. HPI  Past Medical History:  Diagnosis Date  . 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 Arthritis   . Chronic anticoagulation 06/2012  . Dialysis patient [facility]) 01 01 2008  . Hx of colonoscopy    2009 by Dr. Laural Golden. Normal except for small submucosal lipoma. No evidence of polyps or colitis.  . Hyperlipidemia   . Hypertension   . Hypogonadism in male 07/25/2015  . 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  Marland Kitchen OSA (obstructive sleep apnea)   . Renal cell carcinoma ([place]) 08/04/2016    Past Surgical History:  Procedure Laterality Date  . 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 CORONARY  ANGIOPLASTY WITH STENT PLACEMENT    . DG AV DIALYSIS  SHUNT ACCESS EXIST*L* OR     left arm  . HERNIA REPAIR     UMBILICAL HERNIA  . KIDNEY TRANSPLANT    . TEE WITHOUT CARDIOVERSION N/A 07/21/2016   Procedure: TRANSESOPHAGEAL ECHOCARDIOGRAM (TEE);  Surgeon: Wellington Hampshire, MD;  Location: [facility] ORS;  Service: Cardiovascular;  Laterality: N/A;    Family History  Problem Relation Age of Onset  . Cancer Mother        pancreatic  . Heart disease Father   . Diabetes Father     Social History Social History  Substance Use Topics  . Smoking status: Never Smoker  . Smokeless tobacco: Never Used  . Alcohol use No    Allergies  Allergen Reactions  . Statins     Other reaction(s): Arthralgias (intolerance), Myalgias (intolerance)  . Atorvastatin Other (See Comments)    Muscle Aches  . Sertraline Rash    Current Outpatient Prescriptions  Medication Sig Dispense Refill  . acetaminophen (TYLENOL) 325 MG tablet Take 650 mg by mouth every 6 (six) hours as needed.    Marland Kitchen allopurinol (ZYLOPRIM) 100 MG tablet Take 200 mg by mouth daily.     Marland Kitchen amLODipine (NORVASC) 10 MG tablet Take 10 mg by mouth daily.    . carvedilol (COREG) 12.5 MG tablet Take 37.5 mg by mouth 2 (two) times daily.     Marland Kitchen  cinacalcet (SENSIPAR) 30 MG tablet Take 30 mg by mouth every other day.    . dicyclomine (BENTYL) 10 MG capsule Take 2 capsules (20 mg total) by mouth 3 (three) times daily before meals. (Patient taking differently: Take 20 mg by mouth 2 (two) times daily. ) 90 capsule 4  . ezetimibe (ZETIA) 10 MG tablet Take 1 tablet (10 mg total) by mouth daily. 90 tablet 3  . labetalol (NORMODYNE) 300 MG tablet Take 300 mg by mouth 2 (two) times daily.    Marland Kitchen lanthanum (FOSRENOL) 500 MG chewable tablet Chew 1,000 mg by mouth.    . lidocaine-prilocaine (EMLA) cream Apply topically.    Marland Kitchen loratadine (CLARITIN) 10 MG tablet Take 10 mg by mouth daily as needed. Reported on 06/12/2015    . losartan (COZAAR) 25 MG tablet Take 25  mg by mouth daily.  6  . magnesium oxide (MAG-OX) 400 (241.3 MG) MG tablet Take 400 mg by mouth 2 (two) times daily.  1  . NIFEdipine (PROCARDIA-XL/ADALAT CC) 60 MG 24 hr tablet Take 60 mg by mouth daily.    . nitroGLYCERIN (NITROSTAT) 0.4 MG SL tablet Place 1 tablet (0.4 mg total) under the tongue every 5 (five) minutes as needed for chest pain. 25 tablet 2  . Omega-3 1000 MG CAPS Take 3 g by mouth.    Marland Kitchen omeprazole (PRILOSEC) 20 MG capsule Take 20 mg by mouth daily.    . pravastatin (PRAVACHOL) 40 MG tablet Take 40 mg by mouth daily.    . predniSONE (DELTASONE) 10 MG tablet Take 10 mg by mouth daily. 4 tabs daily (40 mg) x 7 days 3 tabs daily (30 mg) x 7 days 2 tabs daily (20 mg) x 7 days 1 tab daily (10 mg) x 7 days 1/2 tab daily (5 mg) x 60 days, then stop    . rosuvastatin (CRESTOR) 10 MG tablet Take 1 tablet (10 mg total) by mouth daily. 90 tablet 3  . Sevelamer Carbonate (RENVELA PO) Take 1 tablet by mouth 3 (three) times daily after meals.    . tacrolimus (PROGRAF) 1 MG capsule Take 1 mg by mouth 2 (two) times daily.    Marland Kitchen warfarin (COUMADIN) 1 MG tablet Take 1 tablet (1 mg) by mouth on Mondays, Wednesdays, and Fridays. Take 2 tablets (2 mg) by mouth all other days. 60 tablet 3   No current facility-administered medications for this visit.     Review of Systems Review of Systems  Constitutional: Negative.   Respiratory: Negative.   Cardiovascular: Negative.     Blood pressure 114/62, pulse 88, resp. rate 14, height 5\' 10"  (1.778 m), weight 188 lb (85.3 kg).  Physical Exam Physical Exam  Constitutional: He is oriented to person, place, and time. He appears well-developed and well-nourished.  Eyes: Conjunctivae are normal. No scleral icterus.  Neck: Neck supple.  Cardiovascular: Normal rate and normal heart sounds.  An irregular rhythm present.  Pulmonary/Chest: Effort normal and breath sounds normal.  Abdominal: Soft. Normal appearance and bowel sounds are normal. There is no  hepatomegaly. There is no tenderness. A hernia is present. Hernia confirmed positive in the ventral area.  Lymphadenopathy:    He has no cervical adenopathy.  Neurological: He is alert and oriented to person, place, and time.  Skin: Skin is warm and dry.    Data Reviewed 07/19/2016 CT of the abdomen showed an 11 x 20 mm fascial defect in the epigastric area with incarcerated fat.  Assessment    Ventral  hernia, plans to initiate peritoneal dialysis.     Plan    Will discuss with Dr. Lucky Cowboy from vascular surgery. Would ideally defer surgery until final decision regarding removal of non-functioning transplant that is requiring steroids for symptom control.  Hernia repair can be completed at the time of peritoneal dialysis catheter placement.      Patient to return as needed.The patient is aware to call back for any questions or concerns.   HPI, Physical Exam, Assessment and Plan have been scribed under the direction and in the presence of Hervey Ard, MD.  Gaspar Cola, CMA  I have completed the exam and reviewed the above documentation for accuracy and completeness.  I agree with the above.  Haematologist has been used and any errors in dictation or transcription are unintentional.  Hervey Ard, M.D., F.A.C.S.  Robert Bellow 11/12/2016, 9:55 PM

## 2016-11-11 NOTE — Patient Instructions (Addendum)
Patient to return as needed. The patient is aware to call back for any questions or concerns. 

## 2016-11-12 DIAGNOSIS — D631 Anemia in chronic kidney disease: Secondary | ICD-10-CM | POA: Diagnosis not present

## 2016-11-12 DIAGNOSIS — N2581 Secondary hyperparathyroidism of renal origin: Secondary | ICD-10-CM | POA: Diagnosis not present

## 2016-11-12 DIAGNOSIS — Z23 Encounter for immunization: Secondary | ICD-10-CM | POA: Diagnosis not present

## 2016-11-12 DIAGNOSIS — Z992 Dependence on renal dialysis: Secondary | ICD-10-CM | POA: Diagnosis not present

## 2016-11-12 DIAGNOSIS — N186 End stage renal disease: Secondary | ICD-10-CM | POA: Diagnosis not present

## 2016-11-14 DIAGNOSIS — N186 End stage renal disease: Secondary | ICD-10-CM | POA: Diagnosis not present

## 2016-11-14 DIAGNOSIS — D631 Anemia in chronic kidney disease: Secondary | ICD-10-CM | POA: Diagnosis not present

## 2016-11-14 DIAGNOSIS — Z23 Encounter for immunization: Secondary | ICD-10-CM | POA: Diagnosis not present

## 2016-11-14 DIAGNOSIS — N2581 Secondary hyperparathyroidism of renal origin: Secondary | ICD-10-CM | POA: Diagnosis not present

## 2016-11-14 DIAGNOSIS — Z992 Dependence on renal dialysis: Secondary | ICD-10-CM | POA: Diagnosis not present

## 2016-11-17 ENCOUNTER — Ambulatory Visit: Payer: Self-pay | Admitting: Pharmacist

## 2016-11-17 DIAGNOSIS — D631 Anemia in chronic kidney disease: Secondary | ICD-10-CM | POA: Diagnosis not present

## 2016-11-17 DIAGNOSIS — N2581 Secondary hyperparathyroidism of renal origin: Secondary | ICD-10-CM | POA: Diagnosis not present

## 2016-11-17 DIAGNOSIS — Z952 Presence of prosthetic heart valve: Secondary | ICD-10-CM

## 2016-11-17 DIAGNOSIS — N186 End stage renal disease: Secondary | ICD-10-CM | POA: Diagnosis not present

## 2016-11-17 DIAGNOSIS — Z992 Dependence on renal dialysis: Secondary | ICD-10-CM | POA: Diagnosis not present

## 2016-11-17 DIAGNOSIS — I4891 Unspecified atrial fibrillation: Secondary | ICD-10-CM

## 2016-11-17 DIAGNOSIS — Z23 Encounter for immunization: Secondary | ICD-10-CM | POA: Diagnosis not present

## 2016-11-17 LAB — POCT INR: INR: 2.1

## 2016-11-18 ENCOUNTER — Ambulatory Visit (HOSPITAL_COMMUNITY): Payer: Medicare Other

## 2016-11-18 ENCOUNTER — Encounter (HOSPITAL_COMMUNITY): Payer: Self-pay

## 2016-11-18 ENCOUNTER — Encounter (HOSPITAL_COMMUNITY): Payer: Medicare Other | Attending: Adult Health

## 2016-11-18 ENCOUNTER — Encounter (HOSPITAL_COMMUNITY): Payer: Medicare Other

## 2016-11-18 ENCOUNTER — Other Ambulatory Visit (HOSPITAL_COMMUNITY): Payer: Medicare Other

## 2016-11-18 VITALS — BP 150/92 | HR 87 | Temp 98.8°F | Resp 16

## 2016-11-18 DIAGNOSIS — N186 End stage renal disease: Secondary | ICD-10-CM | POA: Diagnosis not present

## 2016-11-18 DIAGNOSIS — N183 Chronic kidney disease, stage 3 unspecified: Secondary | ICD-10-CM

## 2016-11-18 DIAGNOSIS — E291 Testicular hypofunction: Secondary | ICD-10-CM | POA: Diagnosis present

## 2016-11-18 DIAGNOSIS — Z992 Dependence on renal dialysis: Secondary | ICD-10-CM | POA: Diagnosis not present

## 2016-11-18 DIAGNOSIS — D631 Anemia in chronic kidney disease: Secondary | ICD-10-CM | POA: Insufficient documentation

## 2016-11-18 LAB — CBC WITH DIFFERENTIAL/PLATELET
Basophils Absolute: 0 10*3/uL (ref 0.0–0.1)
Basophils Relative: 0 %
EOS ABS: 0.2 10*3/uL (ref 0.0–0.7)
EOS PCT: 2 %
HCT: 32.9 % — ABNORMAL LOW (ref 39.0–52.0)
Hemoglobin: 10 g/dL — ABNORMAL LOW (ref 13.0–17.0)
LYMPHS ABS: 2 10*3/uL (ref 0.7–4.0)
LYMPHS PCT: 19 %
MCH: 30.1 pg (ref 26.0–34.0)
MCHC: 30.4 g/dL (ref 30.0–36.0)
MCV: 99.1 fL (ref 78.0–100.0)
MONO ABS: 0.4 10*3/uL (ref 0.1–1.0)
Monocytes Relative: 4 %
Neutro Abs: 8.2 10*3/uL — ABNORMAL HIGH (ref 1.7–7.7)
Neutrophils Relative %: 75 %
PLATELETS: 240 10*3/uL (ref 150–400)
RBC: 3.32 MIL/uL — AB (ref 4.22–5.81)
RDW: 17.6 % — AB (ref 11.5–15.5)
WBC: 10.8 10*3/uL — ABNORMAL HIGH (ref 4.0–10.5)

## 2016-11-18 LAB — BASIC METABOLIC PANEL
ANION GAP: 15 (ref 5–15)
BUN: 58 mg/dL — ABNORMAL HIGH (ref 6–20)
CALCIUM: 8.3 mg/dL — AB (ref 8.9–10.3)
CO2: 24 mmol/L (ref 22–32)
Chloride: 100 mmol/L — ABNORMAL LOW (ref 101–111)
Creatinine, Ser: 8.05 mg/dL — ABNORMAL HIGH (ref 0.61–1.24)
GFR calc Af Amer: 7 mL/min — ABNORMAL LOW (ref >60)
GFR, EST NON AFRICAN AMERICAN: 6 mL/min — AB (ref >60)
Glucose, Bld: 108 mg/dL — ABNORMAL HIGH (ref 65–99)
POTASSIUM: 4.6 mmol/L (ref 3.5–5.1)
SODIUM: 139 mmol/L (ref 135–145)

## 2016-11-18 MED ORDER — TESTOSTERONE CYPIONATE 200 MG/ML IM SOLN
200.0000 mg | INTRAMUSCULAR | Status: DC
  Administered 2016-11-18: 200 mg via INTRAMUSCULAR

## 2016-11-18 MED ORDER — TESTOSTERONE CYPIONATE 200 MG/ML IM SOLN
INTRAMUSCULAR | Status: AC
  Filled 2016-11-18: qty 1

## 2016-11-18 NOTE — Patient Instructions (Signed)
[place] at [facility] Discharge Instructions  RECOMMENDATIONS MADE BY THE CONSULTANT AND ANY TEST RESULTS WILL BE SENT TO YOUR REFERRING PHYSICIAN.  Testosterone injection given today Follow up as scheduled.  Thank you for choosing [place] at [facility] to provide your oncology and hematology care.  To afford each patient quality time with our provider, please arrive at least 15 minutes before your scheduled appointment time.    If you have a lab appointment with the [place] please come in thru the  Main Entrance and check in at the main information desk  You need to re-schedule your appointment should you arrive 10 or more minutes late.  We strive to give you quality time with our providers, and arriving late affects you and other patients whose appointments are after yours.  Also, if you no show three or more times for appointments you may be dismissed from the clinic at the providers discretion.     Again, thank you for choosing [facility]  Our hope is that these requests will decrease the amount of time that you wait before being seen by our physicians.       _____________________________________________________________  Should you have questions after your visit to [facility], please contact our office at [phone] between the hours of 8:30 a.m. and 4:30 p.m.  Voicemails left after 4:30 p.m. will not be returned until the following business day.  For prescription refill requests, have your pharmacy contact our office.       Resources For Cancer Patients and their Caregivers ? American Cancer Society: Can assist with transportation, wigs, general needs, runs Look Good Feel Better.        302-462-6842 ? Cancer Care: Provides financial assistance, online support groups, medication/co-pay assistance.  1-800-813-HOPE (330)501-8133) ? [place] Assists [place] cancer  patients and their families through emotional , educational and financial support.  367 039 2062 ? [place] DSS Where to apply for food stamps, Medicaid and utility assistance. (336) 422-4282 ? [employer]: Transportation to medical appointments. (520) 174-9531 ? Social Security Administration: May apply for disability if have a Stage IV cancer. 682-114-3540 626-566-2593 ? [employer], Disability and Transit Services: Assists with nutrition, care and transit needs. [address] Support Programs: @10RELATIVEDAYS @ > Cancer Support Group  2nd Tuesday of the month 1pm-2pm, Journey Room  > Creative Journey  3rd Tuesday of the month 1130am-1pm, Journey Room  > Look Good Feel Better  1st Wednesday of the month 10am-12 noon, Journey Room (Call [place] to register [phone])

## 2016-11-18 NOTE — Progress Notes (Signed)
Jeremy [name] presents today for injection per MD orders. Testosterone 200mg   administered IM in right upper buttocks.  Administration without incident. Patient tolerated well. Vitals stable and discharged home from clinic ambulatory. Follow up as scheduled.

## 2016-11-19 DIAGNOSIS — N2581 Secondary hyperparathyroidism of renal origin: Secondary | ICD-10-CM | POA: Diagnosis not present

## 2016-11-19 DIAGNOSIS — Z23 Encounter for immunization: Secondary | ICD-10-CM | POA: Diagnosis not present

## 2016-11-19 DIAGNOSIS — N186 End stage renal disease: Secondary | ICD-10-CM | POA: Diagnosis not present

## 2016-11-19 DIAGNOSIS — D631 Anemia in chronic kidney disease: Secondary | ICD-10-CM | POA: Diagnosis not present

## 2016-11-19 DIAGNOSIS — Z992 Dependence on renal dialysis: Secondary | ICD-10-CM | POA: Diagnosis not present

## 2016-11-19 LAB — TESTOSTERONE: TESTOSTERONE: 287 ng/dL (ref 264–916)

## 2016-11-20 DIAGNOSIS — Z940 Kidney transplant status: Secondary | ICD-10-CM | POA: Diagnosis not present

## 2016-11-20 DIAGNOSIS — I4891 Unspecified atrial fibrillation: Secondary | ICD-10-CM | POA: Diagnosis not present

## 2016-11-20 DIAGNOSIS — D631 Anemia in chronic kidney disease: Secondary | ICD-10-CM | POA: Diagnosis not present

## 2016-11-20 DIAGNOSIS — I10 Essential (primary) hypertension: Secondary | ICD-10-CM | POA: Diagnosis not present

## 2016-11-20 DIAGNOSIS — Z4822 Encounter for aftercare following kidney transplant: Secondary | ICD-10-CM | POA: Diagnosis not present

## 2016-11-20 DIAGNOSIS — Z952 Presence of prosthetic heart valve: Secondary | ICD-10-CM | POA: Diagnosis not present

## 2016-11-20 DIAGNOSIS — N1330 Unspecified hydronephrosis: Secondary | ICD-10-CM | POA: Diagnosis not present

## 2016-11-20 DIAGNOSIS — D8989 Other specified disorders involving the immune mechanism, not elsewhere classified: Secondary | ICD-10-CM | POA: Diagnosis not present

## 2016-11-20 DIAGNOSIS — I2510 Atherosclerotic heart disease of native coronary artery without angina pectoris: Secondary | ICD-10-CM | POA: Diagnosis not present

## 2016-11-20 DIAGNOSIS — Z79899 Other long term (current) drug therapy: Secondary | ICD-10-CM | POA: Diagnosis not present

## 2016-11-20 DIAGNOSIS — Z7901 Long term (current) use of anticoagulants: Secondary | ICD-10-CM | POA: Diagnosis not present

## 2016-11-21 DIAGNOSIS — N2581 Secondary hyperparathyroidism of renal origin: Secondary | ICD-10-CM | POA: Diagnosis not present

## 2016-11-21 DIAGNOSIS — Z992 Dependence on renal dialysis: Secondary | ICD-10-CM | POA: Diagnosis not present

## 2016-11-21 DIAGNOSIS — D631 Anemia in chronic kidney disease: Secondary | ICD-10-CM | POA: Diagnosis not present

## 2016-11-21 DIAGNOSIS — Z23 Encounter for immunization: Secondary | ICD-10-CM | POA: Diagnosis not present

## 2016-11-21 DIAGNOSIS — N186 End stage renal disease: Secondary | ICD-10-CM | POA: Diagnosis not present

## 2016-11-24 ENCOUNTER — Ambulatory Visit: Payer: Self-pay | Admitting: Pharmacist

## 2016-11-24 DIAGNOSIS — Z992 Dependence on renal dialysis: Secondary | ICD-10-CM | POA: Diagnosis not present

## 2016-11-24 DIAGNOSIS — I4891 Unspecified atrial fibrillation: Secondary | ICD-10-CM

## 2016-11-24 DIAGNOSIS — D631 Anemia in chronic kidney disease: Secondary | ICD-10-CM | POA: Diagnosis not present

## 2016-11-24 DIAGNOSIS — Z23 Encounter for immunization: Secondary | ICD-10-CM | POA: Diagnosis not present

## 2016-11-24 DIAGNOSIS — N186 End stage renal disease: Secondary | ICD-10-CM | POA: Diagnosis not present

## 2016-11-24 DIAGNOSIS — N2581 Secondary hyperparathyroidism of renal origin: Secondary | ICD-10-CM | POA: Diagnosis not present

## 2016-11-24 DIAGNOSIS — Z952 Presence of prosthetic heart valve: Secondary | ICD-10-CM

## 2016-11-24 LAB — POCT INR: INR: 2.7

## 2016-11-25 DIAGNOSIS — C649 Malignant neoplasm of unspecified kidney, except renal pelvis: Secondary | ICD-10-CM | POA: Diagnosis not present

## 2016-11-25 DIAGNOSIS — C801 Malignant (primary) neoplasm, unspecified: Secondary | ICD-10-CM | POA: Diagnosis not present

## 2016-11-25 DIAGNOSIS — R938 Abnormal findings on diagnostic imaging of other specified body structures: Secondary | ICD-10-CM | POA: Diagnosis not present

## 2016-11-25 DIAGNOSIS — K769 Liver disease, unspecified: Secondary | ICD-10-CM | POA: Diagnosis not present

## 2016-11-26 DIAGNOSIS — D631 Anemia in chronic kidney disease: Secondary | ICD-10-CM | POA: Diagnosis not present

## 2016-11-26 DIAGNOSIS — N186 End stage renal disease: Secondary | ICD-10-CM | POA: Diagnosis not present

## 2016-11-26 DIAGNOSIS — Z992 Dependence on renal dialysis: Secondary | ICD-10-CM | POA: Diagnosis not present

## 2016-11-26 DIAGNOSIS — Z23 Encounter for immunization: Secondary | ICD-10-CM | POA: Diagnosis not present

## 2016-11-26 DIAGNOSIS — N2581 Secondary hyperparathyroidism of renal origin: Secondary | ICD-10-CM | POA: Diagnosis not present

## 2016-11-28 DIAGNOSIS — N186 End stage renal disease: Secondary | ICD-10-CM | POA: Diagnosis not present

## 2016-11-28 DIAGNOSIS — Z992 Dependence on renal dialysis: Secondary | ICD-10-CM | POA: Diagnosis not present

## 2016-11-28 DIAGNOSIS — N2581 Secondary hyperparathyroidism of renal origin: Secondary | ICD-10-CM | POA: Diagnosis not present

## 2016-11-28 DIAGNOSIS — Z23 Encounter for immunization: Secondary | ICD-10-CM | POA: Diagnosis not present

## 2016-11-28 DIAGNOSIS — D631 Anemia in chronic kidney disease: Secondary | ICD-10-CM | POA: Diagnosis not present

## 2016-12-01 ENCOUNTER — Ambulatory Visit: Payer: Self-pay | Admitting: Pharmacist

## 2016-12-01 DIAGNOSIS — Z992 Dependence on renal dialysis: Secondary | ICD-10-CM | POA: Diagnosis not present

## 2016-12-01 DIAGNOSIS — N2581 Secondary hyperparathyroidism of renal origin: Secondary | ICD-10-CM | POA: Diagnosis not present

## 2016-12-01 DIAGNOSIS — N186 End stage renal disease: Secondary | ICD-10-CM | POA: Diagnosis not present

## 2016-12-01 DIAGNOSIS — I482 Chronic atrial fibrillation: Secondary | ICD-10-CM | POA: Diagnosis not present

## 2016-12-01 DIAGNOSIS — I4891 Unspecified atrial fibrillation: Secondary | ICD-10-CM

## 2016-12-01 DIAGNOSIS — D631 Anemia in chronic kidney disease: Secondary | ICD-10-CM | POA: Diagnosis not present

## 2016-12-01 DIAGNOSIS — Z952 Presence of prosthetic heart valve: Secondary | ICD-10-CM

## 2016-12-01 DIAGNOSIS — Z23 Encounter for immunization: Secondary | ICD-10-CM | POA: Diagnosis not present

## 2016-12-01 LAB — POCT INR: INR: 3

## 2016-12-03 DIAGNOSIS — N2581 Secondary hyperparathyroidism of renal origin: Secondary | ICD-10-CM | POA: Diagnosis not present

## 2016-12-03 DIAGNOSIS — D631 Anemia in chronic kidney disease: Secondary | ICD-10-CM | POA: Diagnosis not present

## 2016-12-03 DIAGNOSIS — N186 End stage renal disease: Secondary | ICD-10-CM | POA: Diagnosis not present

## 2016-12-03 DIAGNOSIS — Z992 Dependence on renal dialysis: Secondary | ICD-10-CM | POA: Diagnosis not present

## 2016-12-03 DIAGNOSIS — Z23 Encounter for immunization: Secondary | ICD-10-CM | POA: Diagnosis not present

## 2016-12-05 DIAGNOSIS — T8610 Unspecified complication of kidney transplant: Secondary | ICD-10-CM | POA: Diagnosis not present

## 2016-12-05 DIAGNOSIS — Z992 Dependence on renal dialysis: Secondary | ICD-10-CM | POA: Diagnosis not present

## 2016-12-05 DIAGNOSIS — Z23 Encounter for immunization: Secondary | ICD-10-CM | POA: Diagnosis not present

## 2016-12-05 DIAGNOSIS — N186 End stage renal disease: Secondary | ICD-10-CM | POA: Diagnosis not present

## 2016-12-05 DIAGNOSIS — D631 Anemia in chronic kidney disease: Secondary | ICD-10-CM | POA: Diagnosis not present

## 2016-12-05 DIAGNOSIS — N2581 Secondary hyperparathyroidism of renal origin: Secondary | ICD-10-CM | POA: Diagnosis not present

## 2016-12-08 DIAGNOSIS — Z23 Encounter for immunization: Secondary | ICD-10-CM | POA: Diagnosis not present

## 2016-12-08 DIAGNOSIS — N2581 Secondary hyperparathyroidism of renal origin: Secondary | ICD-10-CM | POA: Diagnosis not present

## 2016-12-08 DIAGNOSIS — N186 End stage renal disease: Secondary | ICD-10-CM | POA: Diagnosis not present

## 2016-12-08 DIAGNOSIS — D631 Anemia in chronic kidney disease: Secondary | ICD-10-CM | POA: Diagnosis not present

## 2016-12-09 ENCOUNTER — Ambulatory Visit: Payer: Self-pay | Admitting: Pharmacist

## 2016-12-09 DIAGNOSIS — I4891 Unspecified atrial fibrillation: Secondary | ICD-10-CM

## 2016-12-09 DIAGNOSIS — Z952 Presence of prosthetic heart valve: Secondary | ICD-10-CM

## 2016-12-09 LAB — POCT INR: INR: 2

## 2016-12-10 ENCOUNTER — Ambulatory Visit (HOSPITAL_COMMUNITY): Payer: Medicare Other | Admitting: Adult Health

## 2016-12-10 ENCOUNTER — Other Ambulatory Visit (HOSPITAL_COMMUNITY): Payer: Medicare Other

## 2016-12-10 DIAGNOSIS — N186 End stage renal disease: Secondary | ICD-10-CM | POA: Diagnosis not present

## 2016-12-10 DIAGNOSIS — N2581 Secondary hyperparathyroidism of renal origin: Secondary | ICD-10-CM | POA: Diagnosis not present

## 2016-12-10 DIAGNOSIS — Z23 Encounter for immunization: Secondary | ICD-10-CM | POA: Diagnosis not present

## 2016-12-10 DIAGNOSIS — D631 Anemia in chronic kidney disease: Secondary | ICD-10-CM | POA: Diagnosis not present

## 2016-12-12 DIAGNOSIS — Z23 Encounter for immunization: Secondary | ICD-10-CM | POA: Diagnosis not present

## 2016-12-12 DIAGNOSIS — D631 Anemia in chronic kidney disease: Secondary | ICD-10-CM | POA: Diagnosis not present

## 2016-12-12 DIAGNOSIS — N2581 Secondary hyperparathyroidism of renal origin: Secondary | ICD-10-CM | POA: Diagnosis not present

## 2016-12-12 DIAGNOSIS — N186 End stage renal disease: Secondary | ICD-10-CM | POA: Diagnosis not present

## 2016-12-15 ENCOUNTER — Ambulatory Visit: Payer: Self-pay | Admitting: Pharmacist

## 2016-12-15 ENCOUNTER — Other Ambulatory Visit (HOSPITAL_COMMUNITY): Payer: Self-pay | Admitting: *Deleted

## 2016-12-15 DIAGNOSIS — N186 End stage renal disease: Secondary | ICD-10-CM | POA: Diagnosis not present

## 2016-12-15 DIAGNOSIS — Z23 Encounter for immunization: Secondary | ICD-10-CM | POA: Diagnosis not present

## 2016-12-15 DIAGNOSIS — N189 Chronic kidney disease, unspecified: Principal | ICD-10-CM

## 2016-12-15 DIAGNOSIS — N2581 Secondary hyperparathyroidism of renal origin: Secondary | ICD-10-CM | POA: Diagnosis not present

## 2016-12-15 DIAGNOSIS — Z952 Presence of prosthetic heart valve: Secondary | ICD-10-CM

## 2016-12-15 DIAGNOSIS — D631 Anemia in chronic kidney disease: Secondary | ICD-10-CM

## 2016-12-15 DIAGNOSIS — I4891 Unspecified atrial fibrillation: Secondary | ICD-10-CM

## 2016-12-15 LAB — POCT INR: INR: 2.2

## 2016-12-15 NOTE — Progress Notes (Signed)
Care was provided under my supervision. I agree with the management as indicated in the note.  Sophronia Varney DO  

## 2016-12-15 NOTE — Progress Notes (Signed)
Per Dr. Lacinda Axon, increased INR goal to 2.5-3.5 per guideline recommendations given development of Afib earlier this year. Will carefully monitor for bleeding as patient has had h/o rectal bleed at INR of 3.7. Patient verbalized understanding.   Carlean Jews, Pharm.D. PGY2 Ambulatory Care Pharmacy Resident Phone: [phone]

## 2016-12-16 ENCOUNTER — Encounter (HOSPITAL_COMMUNITY): Payer: Medicare Other | Attending: Oncology

## 2016-12-16 ENCOUNTER — Encounter (HOSPITAL_BASED_OUTPATIENT_CLINIC_OR_DEPARTMENT_OTHER): Payer: Medicare Other

## 2016-12-16 ENCOUNTER — Encounter (HOSPITAL_BASED_OUTPATIENT_CLINIC_OR_DEPARTMENT_OTHER): Payer: Medicare Other | Admitting: Adult Health

## 2016-12-16 ENCOUNTER — Encounter (HOSPITAL_COMMUNITY): Payer: Self-pay | Admitting: Adult Health

## 2016-12-16 VITALS — BP 148/73 | HR 87 | Temp 98.3°F | Resp 18 | Wt 189.1 lb

## 2016-12-16 DIAGNOSIS — C649 Malignant neoplasm of unspecified kidney, except renal pelvis: Secondary | ICD-10-CM | POA: Diagnosis not present

## 2016-12-16 DIAGNOSIS — N189 Chronic kidney disease, unspecified: Secondary | ICD-10-CM

## 2016-12-16 DIAGNOSIS — N183 Chronic kidney disease, stage 3 unspecified: Secondary | ICD-10-CM

## 2016-12-16 DIAGNOSIS — E291 Testicular hypofunction: Secondary | ICD-10-CM

## 2016-12-16 DIAGNOSIS — Z992 Dependence on renal dialysis: Secondary | ICD-10-CM | POA: Insufficient documentation

## 2016-12-16 DIAGNOSIS — N186 End stage renal disease: Secondary | ICD-10-CM | POA: Diagnosis not present

## 2016-12-16 DIAGNOSIS — D631 Anemia in chronic kidney disease: Secondary | ICD-10-CM

## 2016-12-16 DIAGNOSIS — Z85528 Personal history of other malignant neoplasm of kidney: Secondary | ICD-10-CM | POA: Diagnosis not present

## 2016-12-16 LAB — CBC WITH DIFFERENTIAL/PLATELET
BASOS ABS: 0 10*3/uL (ref 0.0–0.1)
Basophils Relative: 0 %
EOS PCT: 2 %
Eosinophils Absolute: 0.2 10*3/uL (ref 0.0–0.7)
HCT: 35.4 % — ABNORMAL LOW (ref 39.0–52.0)
Hemoglobin: 10.6 g/dL — ABNORMAL LOW (ref 13.0–17.0)
LYMPHS ABS: 1.9 10*3/uL (ref 0.7–4.0)
LYMPHS PCT: 24 %
MCH: 31.3 pg (ref 26.0–34.0)
MCHC: 29.9 g/dL — ABNORMAL LOW (ref 30.0–36.0)
MCV: 104.4 fL — AB (ref 78.0–100.0)
MONO ABS: 0.6 10*3/uL (ref 0.1–1.0)
Monocytes Relative: 8 %
NEUTROS ABS: 5.3 10*3/uL (ref 1.7–7.7)
Neutrophils Relative %: 66 %
PLATELETS: 185 10*3/uL (ref 150–400)
RBC: 3.39 MIL/uL — ABNORMAL LOW (ref 4.22–5.81)
RDW: 19.1 % — AB (ref 11.5–15.5)
WBC: 8 10*3/uL (ref 4.0–10.5)

## 2016-12-16 LAB — BASIC METABOLIC PANEL
ANION GAP: 13 (ref 5–15)
BUN: 42 mg/dL — AB (ref 6–20)
CALCIUM: 9.2 mg/dL (ref 8.9–10.3)
CO2: 28 mmol/L (ref 22–32)
Chloride: 99 mmol/L — ABNORMAL LOW (ref 101–111)
Creatinine, Ser: 7.93 mg/dL — ABNORMAL HIGH (ref 0.61–1.24)
GFR calc Af Amer: 8 mL/min — ABNORMAL LOW (ref >60)
GFR, EST NON AFRICAN AMERICAN: 7 mL/min — AB (ref >60)
GLUCOSE: 137 mg/dL — AB (ref 65–99)
POTASSIUM: 4.7 mmol/L (ref 3.5–5.1)
Sodium: 140 mmol/L (ref 135–145)

## 2016-12-16 MED ORDER — TESTOSTERONE CYPIONATE 200 MG/ML IM SOLN
200.0000 mg | INTRAMUSCULAR | Status: DC
  Administered 2016-12-16: 200 mg via INTRAMUSCULAR

## 2016-12-16 MED ORDER — TESTOSTERONE CYPIONATE 200 MG/ML IM SOLN
INTRAMUSCULAR | Status: AC
  Filled 2016-12-16: qty 1

## 2016-12-16 NOTE — Patient Instructions (Addendum)
[place] at [facility] Discharge Instructions  RECOMMENDATIONS MADE BY THE CONSULTANT AND ANY TEST RESULTS WILL BE SENT TO YOUR REFERRING PHYSICIAN.  You were seen today by Mike Craze, NP Continue monthly injections Follow up in 4-5 months with physician. See schedulers up front for appointments    Thank you for choosing [place] at [facility] to provide your oncology and hematology care.  To afford each patient quality time with our provider, please arrive at least 15 minutes before your scheduled appointment time.    If you have a lab appointment with the [facility] please come in thru the  Main Entrance and check in at the main information desk  You need to re-schedule your appointment should you arrive 10 or more minutes late.  We strive to give you quality time with our providers, and arriving late affects you and other patients whose appointments are after yours.  Also, if you no show three or more times for appointments you may be dismissed from the clinic at the providers discretion.     Again, thank you for choosing [facility].  Our hope is that these requests will decrease the amount of time that you wait before being seen by our physicians.       _____________________________________________________________  Should you have questions after your visit to [facility], please contact our office at [phone] between the hours of 8:30 a.m. and 4:30 p.m.  Voicemails left after 4:30 p.m. will not be returned until the following business day.  For prescription refill requests, have your pharmacy contact our office.       Resources For Cancer Patients and their Caregivers ? American Cancer Society: Can assist with transportation, wigs, general needs, runs Look Good Feel Better.        (973)767-8789 ? Cancer Care: Provides financial assistance, online support groups, medication/co-pay  assistance.  1-800-813-HOPE 616-416-9507) ? [place] Assists [place] cancer patients and their families through emotional , educational and financial support.  205-153-7678 ? [place] DSS Where to apply for food stamps, Medicaid and utility assistance. 520-375-6771 ? [employer]: Transportation to medical appointments. (929) 676-1553 ? Social Security Administration: May apply for disability if have a Stage IV cancer. 515 323 4172 (367) 145-0998 ? [employer], Disability and Transit Services: Assists with nutrition, care and transit needs. [place] Support Programs: @10RELATIVEDAYS @ > Cancer Support Group  2nd Tuesday of the month 1pm-2pm, Journey Room  > Creative Journey  3rd Tuesday of the month 1130am-1pm, Journey Room  > Look Good Feel Better  1st Wednesday of the month 10am-12 noon, Journey Room (Call [place] to register [phone])

## 2016-12-16 NOTE — Progress Notes (Signed)
Jeremy [name] tolerated Testosterone injection well without complaints or incident. VSS Pt discharged self ambulatory in satisfactory condition

## 2016-12-16 NOTE — Progress Notes (Signed)
[place] [address], [place] [address]   CLINIC:  Medical Oncology/Hematology  PCP:  Coral Spikes, DO [address] [place] [address] [phone]   REASON FOR VISIT:  Follow-up for Anemia d/t CKD (hemodialysis) AND Hypogonadism AND History of renal cell carcinoma   CURRENT THERAPY: ESA therapy (managed by nephrology) AND Testosterone injection monthly AND Observation   BRIEF ONCOLOGIC HISTORY:    History of renal cell carcinoma   07/23/2016 Imaging    CT CAP- Chest Impression:  1. No evidence of pulmonary infection. 2. Coronary artery calcification and aortic atherosclerotic calcification.  Abdomen / Pelvis Impression:  1. 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 No evidence of frank renal abscess. 2. Renal arteries are opacified. No excretion of IV contrast identified. 3. No abscess in the abdomen pelvis. 4. Mild splenomegaly 5. Renal osteodystrophy.        HISTORY OF PRESENT ILLNESS:  (From Kirby Crigler, PA-C's last note on 08/04/16)  Jeremy [name] 61 y.o. male returns for followup of Anemia in the setting of ESRD requiring HD. AND Hypogonadism, on depo-testosterone therapy. 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  Moderate arterionephrosclerosis with focal features of accelerated HTN related injury, moderate diabetic nephropathy, recurrent IgA nephropathy. AND ESRD, on hemodialysis. AND Splenomegaly on CT imaging on 07/19/2016.    INTERVAL HISTORY:  Jeremy [name] 61 y.o. male returns to cancer center for routine follow-up.    Since his last  visit to the cancer center in 07/2016, unfortunately he was hospitalized at [facility]) from 10/02/16-10/12/16 with fever thought to be secondary to transplanted kidney rejection.  He tells me that "they cauterized the artery leading to the transplanted kidney so it would finish dying off."  He was placed on long prednisone taper over 3 months at the time of discharge.   He shares with me that he remains on steroids as directed; he has ~3 weeks left of the taper before he completes the course.  After that time, he is hoping to undergo abdominal hernia repair surgery so that then he can have peritoneal dialysis catheter placed.  He shares with me that they do not think he will be a candidate for additional transplant, so he is hoping to be able to qualify for PD to help with quality of life.    Remains on Coumadin for mechanical aortic valve; this is managed by cardiology. Endorses easy bleeding/bruising, but denies any frank bleeding episodes including blood in his stools or dark/tarry stools.  He is essentially anuric; denies any bloody penile discharge. Denies nosebleeds or gingival bleeding.  Reports that he has a new diagnosis of A-fib, which periodically gives him nausea. He has been taking OTC nausea meds which have been effective.  Reports fatigue and "not a lot of energy" in recent weeks/months. He has some shortness of breath on exertion, which is longstanding and stable.  Endorses occasional dizziness when he first wakes up in the morning.  Reports new (R) hip pain, "that just started today." The pain comes and goes, "and it goes down my butt some." He thinks he may have "pulled a muscle" recently.  He took a dose of Tylenol Arthritis OTC medication, which has been helpful.  Denies any other focal bone pain.    He is due for next testosterone injection today. He states that he feels like the injections help with his energy levels "somewhat."  He attributes his new fatigue  to recent hospitalization and regaining some of his strength since that time, as well as his myriad of health problems.      REVIEW OF SYSTEMS:  Review of Systems - Oncology, per HPI    PAST MEDICAL/SURGICAL HISTORY:  Past Medical History:  Diagnosis Date  . 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 Arthritis   . Chronic anticoagulation 06/2012  . Dialysis patient [facility]) 01 01 2008  . Hx of colonoscopy    2009 by Dr. Laural Golden. Normal except for small submucosal lipoma. No evidence of polyps or colitis.  . Hyperlipidemia   . Hypertension   . Hypogonadism in male 07/25/2015  . Kidney disease    Chronic kidney disease secondary to IgA nephropathy diagnosed when he was 41. 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  Marland Kitchen OSA (obstructive sleep apnea)   . Renal cell carcinoma ([place]) 08/04/2016   Past Surgical History:  Procedure Laterality Date  . AORTIC VALVE REPLACEMENT  3 /11 /2014   At [facility]    . CORONARY ANGIOPLASTY WITH STENT PLACEMENT    . DG AV DIALYSIS  SHUNT ACCESS EXIST*L* OR     left arm  . HERNIA REPAIR     UMBILICAL HERNIA  . KIDNEY TRANSPLANT    . TEE WITHOUT CARDIOVERSION N/A 07/21/2016   Procedure: TRANSESOPHAGEAL ECHOCARDIOGRAM (TEE);  Surgeon: [place] Hampshire, MD;  Location: [facility] ORS;  Service: Cardiovascular;  Laterality: N/A;     SOCIAL HISTORY:  Social History   Social History  . Marital status: Divorced    Spouse name: N/A  . Number of children: N/A  . Years of education: N/A   Occupational History  . courier     [employer]   Social History Main Topics  . Smoking status: Never Smoker  . Smokeless tobacco: Never Used  . Alcohol use No  . Drug use: No  . Sexual activity: Not Currently    Partners: Female   Other Topics Concern  . Not on file   Social History Narrative   Single   1 daughter 59     FAMILY  HISTORY:  Family History  Problem Relation Age of Onset  . Cancer Mother        pancreatic  . Heart disease Father   . Diabetes Father     CURRENT MEDICATIONS:  Outpatient Encounter Prescriptions as of 12/16/2016  Medication Sig Note  . acetaminophen (TYLENOL) 325 MG tablet Take 650 mg by mouth every 6 (six) hours as needed.   Marland Kitchen allopurinol (ZYLOPRIM) 100 MG tablet Take 200 mg by mouth daily.    Marland Kitchen amLODipine (NORVASC) 10 MG tablet Take 10 mg by mouth daily.   . cinacalcet (SENSIPAR) 60 MG tablet Take 60 mg by mouth every other day.    . dicyclomine (BENTYL) 10 MG capsule Take 2 capsules (20 mg total) by mouth 3 (three) times daily before meals. (Patient taking differently: Take 20 mg by mouth 2 (two) times daily. )   . ezetimibe (ZETIA) 10 MG tablet Take 1 tablet (10 mg total) by mouth daily.   Marland Kitchen  lanthanum (FOSRENOL) 1000 MG chewable tablet Chew 2,000 mg by mouth.   . lidocaine-prilocaine (EMLA) cream Apply topically.   Marland Kitchen loratadine (CLARITIN) 10 MG tablet Take 10 mg by mouth daily as needed. Reported on 06/12/2015   . metoprolol tartrate (LOPRESSOR) 100 MG tablet Take 100 mg by mouth.   . nitroGLYCERIN (NITROSTAT) 0.4 MG SL tablet Place 1 tablet (0.4 mg total) under the tongue every 5 (five) minutes as needed for chest pain. 06/25/2015: Never taken according to patient  . Omega-3 1000 MG CAPS Take 3 g by mouth.   Marland Kitchen omeprazole (PRILOSEC) 20 MG capsule Take 20 mg by mouth daily.   . pravastatin (PRAVACHOL) 40 MG tablet Take 40 mg by mouth daily.   . predniSONE (DELTASONE) 10 MG tablet Take 20 mg by mouth daily. 4 tabs daily (40 mg) x 7 days 3 tabs daily (30 mg) x 7 days 2 tabs daily (20 mg) x 7 days 1 tab daily (10 mg) x 7 days 1/2 tab daily (5 mg) x 60 days, then stop    . rOPINIRole (REQUIP) 0.25 MG tablet Take 0.25 mg by mouth.   . warfarin (COUMADIN) 1 MG tablet Take 1 tablet (1 mg) by mouth on Mondays, Wednesdays, and Fridays. Take 2 tablets (2 mg) by mouth all other days.   .  [DISCONTINUED] carvedilol (COREG) 12.5 MG tablet Take 37.5 mg by mouth 2 (two) times daily.    . [DISCONTINUED] lanthanum (FOSRENOL) 500 MG chewable tablet Chew 1,000 mg by mouth.   . [DISCONTINUED] losartan (COZAAR) 25 MG tablet Take 25 mg by mouth daily.   . [DISCONTINUED] magnesium oxide (MAG-OX) 400 (241.3 MG) MG tablet Take 400 mg by mouth 2 (two) times daily.   . [DISCONTINUED] NIFEdipine (PROCARDIA-XL/ADALAT CC) 60 MG 24 hr tablet Take 60 mg by mouth daily.   . [DISCONTINUED] rosuvastatin (CRESTOR) 10 MG tablet Take 1 tablet (10 mg total) by mouth daily. (Patient not taking: Reported on 11/18/2016)   . [DISCONTINUED] Sevelamer Carbonate (RENVELA PO) Take 1 tablet by mouth 3 (three) times daily after meals.   . [DISCONTINUED] tacrolimus (PROGRAF) 1 MG capsule Take 1 mg by mouth 2 (two) times daily.    No facility-administered encounter medications on file as of 12/16/2016.     ALLERGIES:  Allergies  Allergen Reactions  . Statins     Other reaction(s): Arthralgias (intolerance), Myalgias (intolerance)  . Atorvastatin Other (See Comments)    Muscle Aches  . Sertraline Rash     PHYSICAL EXAM:   ECOG Performance status: 1-2 - Symptomatic; may require occasional assistance   Vitals:   12/16/16 1437  BP: (!) 148/73  Pulse: 87  Resp: 18  Temp: 98.3 F (36.8 C)  SpO2: 97%   Filed Weights   12/16/16 1437  Weight: 189 lb 1.6 oz (85.8 kg)    Physical Exam  Constitutional: He is oriented to person, place, and time and well-developed, well-nourished, and in no distress.  HENT:  Head: Normocephalic.  Mouth/Throat: Oropharynx is clear and moist. No oropharyngeal exudate.  Eyes: Pupils are equal, round, and reactive to light. Conjunctivae are normal. No scleral icterus.  Neck: Normal range of motion. Neck supple.  Cardiovascular:  -Irregularly irregular -Auscultated click (mechanical aortic valve)  Pulmonary/Chest: Effort normal and breath sounds normal. No respiratory distress.   Abdominal: Soft. Bowel sounds are normal. There is no tenderness.  Palpable abd hernia (chronic)  Musculoskeletal: Normal range of motion. He exhibits no edema.  Lymphadenopathy:    He has  no cervical adenopathy.    He has no axillary adenopathy.       Right: No supraclavicular adenopathy present.       Left: No supraclavicular adenopathy present.  Neurological: He is alert and oriented to person, place, and time. No cranial nerve deficit. Gait normal.  Skin: Skin is warm and dry. No rash noted.  Psychiatric: Mood, memory, affect and judgment normal.  Nursing note and vitals reviewed.    LABORATORY DATA:  I have reviewed the labs as listed.  CBC    Component Value Date/Time   WBC 8.0 12/16/2016 1352   RBC 3.39 (L) 12/16/2016 1352   HGB 10.6 (L) 12/16/2016 1352   HGB 10.0 (L) 08/30/2014 1013   HCT 35.4 (L) 12/16/2016 1352   HCT 32.2 (L) 08/30/2014 1013   PLT 185 12/16/2016 1352   PLT 125 (L) 08/30/2014 1013   MCV 104.4 (H) 12/16/2016 1352   MCV 88.2 09/16/2014 1041   MCV 89 08/30/2014 1013   MCH 31.3 12/16/2016 1352   MCHC 29.9 (L) 12/16/2016 1352   RDW 19.1 (H) 12/16/2016 1352   RDW 14.0 08/30/2014 1013   LYMPHSABS 1.9 12/16/2016 1352   LYMPHSABS 0.8 08/30/2014 1013   MONOABS 0.6 12/16/2016 1352   EOSABS 0.2 12/16/2016 1352   EOSABS 0.1 08/30/2014 1013   BASOSABS 0.0 12/16/2016 1352   BASOSABS 0.0 08/30/2014 1013   CMP Latest Ref Rng & Units 12/16/2016 11/18/2016 08/04/2016  Glucose 65 - 99 mg/dL 137(H) 108(H) 149(H)  BUN 6 - 20 mg/dL 42(H) 58(H) 77(H)  Creatinine 0.61 - 1.24 mg/dL 7.93(H) 8.05(H) 9.36(H)  Sodium 135 - 145 mmol/L 140 139 137  Potassium 3.5 - 5.1 mmol/L 4.7 4.6 4.5  Chloride 101 - 111 mmol/L 99(L) 100(L) 99(L)  CO2 22 - 32 mmol/L '28 24 23  ' Calcium 8.9 - 10.3 mg/dL 9.2 8.3(L) 8.5(L)  Total Protein 6.5 - 8.1 g/dL - - 7.4  Total Bilirubin 0.3 - 1.2 mg/dL - - 0.5  Alkaline Phos 38 - 126 U/L - - 168(H)  AST 15 - 41 U/L - - 24  ALT 17 - 63 U/L - - 28      PENDING LABS:    DIAGNOSTIC IMAGING:    PATHOLOGY:     ASSESSMENT & PLAN:   Anemia d/t CKD (hemodialysis): -ESA therapy managed by nephrology.  Hgb 10.6 g/dL today, which is a little higher than his usual baseline in the ~9 range.  -Historically, his iron studies have been adequate. Vitamin B12 and folate levels have been normal in the past as well.  Will add-on anemia labs at next follow-up visit to ensure stability in light of recent changes to his medical condition and upcoming possible surgical procedures.  -Continue ESA as directed by nephrology.  -Return to cancer center in 4 months for follow-up with labs.   Hypogonadism: -Discussed with him the pros/cons of continued androgen replacement therapy, with one of the possible risks being prostate cancer.  Advantages of testosterone replacement include improved energy and mood, better bone density (which is important given his current chronic steroid use), His last PSA was done in 07/2016 and was normal at 2.18.  I will add-on PSA to recheck at his next visit to the cancer center.   -Goal testosterone would be in the ~400-500 range based on his age and comorbid conditions.  Testosterone levels were low in 07/2016 and 08/2016; dose was increased to 200 mg monthly on 08/25/16.  Last testosterone level was 287 on 11/18/16.  Testosterone level from today is pending.  -Will maintain current testosterone dosing/schedule for now.    History of renal cell carcinoma:  -s/p bilat nephrectomies and transplant.       Dispo:  -Continue monthly testosterone injections.  -Return to cancer center in 4 months for follow-up with labs.    All questions were answered to patient's stated satisfaction. Encouraged patient to call with any new concerns or questions before his next visit to the cancer center and we can certain see him sooner, if needed.    Plan of care discussed with Dr. Talbert Cage, who agrees with the above aforementioned.    Orders  placed this encounter:  Orders Placed This Encounter  Procedures  . CBC with Differential/Platelet  . Basic metabolic panel  . PSA  . Testosterone  . Vitamin B12  . Folate  . Iron and TIBC  . Ferritin      Mike Craze, NP [place] [phone]

## 2016-12-16 NOTE — Patient Instructions (Signed)
[place] [place] at [facility] Discharge Instructions  RECOMMENDATIONS MADE BY THE CONSULTANT AND ANY TEST RESULTS WILL BE SENT TO YOUR REFERRING PHYSICIAN.  Received Testosterone injection today. Follow-up as scheduled. Call clinic for any questions or concerns  Thank you for choosing [place] at [facility] to provide your oncology and hematology care.  To afford each patient quality time with our provider, please arrive at least 15 minutes before your scheduled appointment time.    If you have a lab appointment with the [facility] please come in thru the  Main Entrance and check in at the main information desk  You need to re-schedule your appointment should you arrive 10 or more minutes late.  We strive to give you quality time with our providers, and arriving late affects you and other patients whose appointments are after yours.  Also, if you no show three or more times for appointments you may be dismissed from the clinic at the providers discretion.     Again, thank you for choosing [facility].  Our hope is that these requests will decrease the amount of time that you wait before being seen by our physicians.       _____________________________________________________________  Should you have questions after your visit to [facility], please contact our office at [phone] between the hours of 8:30 a.m. and 4:30 p.m.  Voicemails left after 4:30 p.m. will not be returned until the following business day.  For prescription refill requests, have your pharmacy contact our office.       Resources For Cancer Patients and their Caregivers ? American Cancer Society: Can assist with transportation, wigs, general needs, runs Look Good Feel Better.        ([phone] 004-4346 ? Cancer Care: Provides financial assistance, online support groups, medication/co-pay assistance.  1-800-813-HOPE 585-859-5014) ? [facility] Assists [place] cancer patients and their families through emotional , educational and financial support.  479-538-6699 ? [place] DSS Where to apply for food stamps, Medicaid and utility assistance. 940 219 1640 ? RCATS: Transportation to medical appointments. 469-593-4985 ? Social Security Administration: May apply for disability if have a Stage IV cancer. 629-389-6334 825-548-5432 ? [employer], Disability and Transit Services: Assists with nutrition, care and transit needs. [place] Support Programs: @10RELATIVEDAYS @ > Cancer Support Group  2nd Tuesday of the month 1pm-2pm, Journey Room  > Creative Journey  3rd Tuesday of the month 1130am-1pm, Journey Room  > Look Good Feel Better  1st Wednesday of the month 10am-12 noon, Journey Room (Call [place] to register [phone])

## 2016-12-17 DIAGNOSIS — N2581 Secondary hyperparathyroidism of renal origin: Secondary | ICD-10-CM | POA: Diagnosis not present

## 2016-12-17 DIAGNOSIS — D631 Anemia in chronic kidney disease: Secondary | ICD-10-CM | POA: Diagnosis not present

## 2016-12-17 DIAGNOSIS — Z23 Encounter for immunization: Secondary | ICD-10-CM | POA: Diagnosis not present

## 2016-12-17 DIAGNOSIS — N186 End stage renal disease: Secondary | ICD-10-CM | POA: Diagnosis not present

## 2016-12-17 LAB — TESTOSTERONE: Testosterone: 407 ng/dL (ref 264–916)

## 2016-12-19 DIAGNOSIS — D631 Anemia in chronic kidney disease: Secondary | ICD-10-CM | POA: Diagnosis not present

## 2016-12-19 DIAGNOSIS — Z23 Encounter for immunization: Secondary | ICD-10-CM | POA: Diagnosis not present

## 2016-12-19 DIAGNOSIS — N186 End stage renal disease: Secondary | ICD-10-CM | POA: Diagnosis not present

## 2016-12-19 DIAGNOSIS — N2581 Secondary hyperparathyroidism of renal origin: Secondary | ICD-10-CM | POA: Diagnosis not present

## 2016-12-22 ENCOUNTER — Ambulatory Visit: Payer: Self-pay | Admitting: Pharmacist

## 2016-12-22 DIAGNOSIS — D631 Anemia in chronic kidney disease: Secondary | ICD-10-CM | POA: Diagnosis not present

## 2016-12-22 DIAGNOSIS — Z23 Encounter for immunization: Secondary | ICD-10-CM | POA: Diagnosis not present

## 2016-12-22 DIAGNOSIS — N186 End stage renal disease: Secondary | ICD-10-CM | POA: Diagnosis not present

## 2016-12-22 DIAGNOSIS — N2581 Secondary hyperparathyroidism of renal origin: Secondary | ICD-10-CM | POA: Diagnosis not present

## 2016-12-22 DIAGNOSIS — I4819 Other persistent atrial fibrillation: Secondary | ICD-10-CM

## 2016-12-22 DIAGNOSIS — Z952 Presence of prosthetic heart valve: Secondary | ICD-10-CM

## 2016-12-22 LAB — POCT INR: INR: 2

## 2016-12-23 ENCOUNTER — Encounter: Payer: Self-pay | Admitting: Family Medicine

## 2016-12-23 ENCOUNTER — Telehealth: Payer: Self-pay | Admitting: *Deleted

## 2016-12-23 DIAGNOSIS — I350 Nonrheumatic aortic (valve) stenosis: Secondary | ICD-10-CM | POA: Diagnosis not present

## 2016-12-23 DIAGNOSIS — I4891 Unspecified atrial fibrillation: Secondary | ICD-10-CM | POA: Diagnosis not present

## 2016-12-23 DIAGNOSIS — Z79899 Other long term (current) drug therapy: Secondary | ICD-10-CM | POA: Diagnosis not present

## 2016-12-23 DIAGNOSIS — I120 Hypertensive chronic kidney disease with stage 5 chronic kidney disease or end stage renal disease: Secondary | ICD-10-CM | POA: Diagnosis not present

## 2016-12-23 DIAGNOSIS — I2510 Atherosclerotic heart disease of native coronary artery without angina pectoris: Secondary | ICD-10-CM | POA: Diagnosis not present

## 2016-12-23 DIAGNOSIS — R0609 Other forms of dyspnea: Secondary | ICD-10-CM | POA: Diagnosis not present

## 2016-12-23 DIAGNOSIS — Z952 Presence of prosthetic heart valve: Secondary | ICD-10-CM | POA: Diagnosis not present

## 2016-12-23 DIAGNOSIS — N186 End stage renal disease: Secondary | ICD-10-CM | POA: Diagnosis not present

## 2016-12-23 DIAGNOSIS — Z7901 Long term (current) use of anticoagulants: Secondary | ICD-10-CM | POA: Diagnosis not present

## 2016-12-23 DIAGNOSIS — D649 Anemia, unspecified: Secondary | ICD-10-CM | POA: Diagnosis not present

## 2016-12-23 DIAGNOSIS — I4510 Unspecified right bundle-branch block: Secondary | ICD-10-CM | POA: Diagnosis not present

## 2016-12-23 LAB — PROTIME-INR

## 2016-12-23 NOTE — Telephone Encounter (Signed)
Make adjustment please.  You're the best! Better than [place] soon enough.  JC DO

## 2016-12-23 NOTE — Telephone Encounter (Signed)
CRITICAL VALUE STICKER  CRITICAL VALUE: INR 2.0  RECEIVER (on-site recipient of call): Jari Favre, CMA  DATE & TIME NOTIFIED: 12/23/16 @ 1:47pm  MESSENGER (representative from lab): MDINR  MD NOTIFIED: Dr. Thersa Salt  TIME OF NOTIFICATION:1:50pm  RESPONSE: (see phone note)

## 2016-12-24 DIAGNOSIS — N2581 Secondary hyperparathyroidism of renal origin: Secondary | ICD-10-CM | POA: Diagnosis not present

## 2016-12-24 DIAGNOSIS — D631 Anemia in chronic kidney disease: Secondary | ICD-10-CM | POA: Diagnosis not present

## 2016-12-24 DIAGNOSIS — Z23 Encounter for immunization: Secondary | ICD-10-CM | POA: Diagnosis not present

## 2016-12-24 DIAGNOSIS — N186 End stage renal disease: Secondary | ICD-10-CM | POA: Diagnosis not present

## 2016-12-26 DIAGNOSIS — N2581 Secondary hyperparathyroidism of renal origin: Secondary | ICD-10-CM | POA: Diagnosis not present

## 2016-12-26 DIAGNOSIS — Z23 Encounter for immunization: Secondary | ICD-10-CM | POA: Diagnosis not present

## 2016-12-26 DIAGNOSIS — N186 End stage renal disease: Secondary | ICD-10-CM | POA: Diagnosis not present

## 2016-12-26 DIAGNOSIS — D631 Anemia in chronic kidney disease: Secondary | ICD-10-CM | POA: Diagnosis not present

## 2016-12-28 DIAGNOSIS — I482 Chronic atrial fibrillation: Secondary | ICD-10-CM | POA: Diagnosis not present

## 2016-12-29 ENCOUNTER — Ambulatory Visit: Payer: Self-pay | Admitting: Pharmacist

## 2016-12-29 DIAGNOSIS — D631 Anemia in chronic kidney disease: Secondary | ICD-10-CM | POA: Diagnosis not present

## 2016-12-29 DIAGNOSIS — N186 End stage renal disease: Secondary | ICD-10-CM | POA: Diagnosis not present

## 2016-12-29 DIAGNOSIS — N2581 Secondary hyperparathyroidism of renal origin: Secondary | ICD-10-CM | POA: Diagnosis not present

## 2016-12-29 DIAGNOSIS — Z952 Presence of prosthetic heart valve: Secondary | ICD-10-CM

## 2016-12-29 DIAGNOSIS — I4819 Other persistent atrial fibrillation: Secondary | ICD-10-CM

## 2016-12-29 DIAGNOSIS — Z23 Encounter for immunization: Secondary | ICD-10-CM | POA: Diagnosis not present

## 2016-12-29 LAB — POCT INR: INR: 2.1

## 2016-12-29 MED ORDER — WARFARIN SODIUM 1 MG PO TABS: ORAL_TABLET | ORAL | 3 refills | Status: DC

## 2016-12-31 DIAGNOSIS — Z23 Encounter for immunization: Secondary | ICD-10-CM | POA: Diagnosis not present

## 2016-12-31 DIAGNOSIS — D631 Anemia in chronic kidney disease: Secondary | ICD-10-CM | POA: Diagnosis not present

## 2016-12-31 DIAGNOSIS — N2581 Secondary hyperparathyroidism of renal origin: Secondary | ICD-10-CM | POA: Diagnosis not present

## 2016-12-31 DIAGNOSIS — N186 End stage renal disease: Secondary | ICD-10-CM | POA: Diagnosis not present

## 2017-01-02 DIAGNOSIS — N186 End stage renal disease: Secondary | ICD-10-CM | POA: Diagnosis not present

## 2017-01-02 DIAGNOSIS — I120 Hypertensive chronic kidney disease with stage 5 chronic kidney disease or end stage renal disease: Secondary | ICD-10-CM | POA: Diagnosis not present

## 2017-01-02 DIAGNOSIS — N2581 Secondary hyperparathyroidism of renal origin: Secondary | ICD-10-CM | POA: Diagnosis not present

## 2017-01-02 DIAGNOSIS — Z992 Dependence on renal dialysis: Secondary | ICD-10-CM | POA: Diagnosis not present

## 2017-01-02 DIAGNOSIS — Z23 Encounter for immunization: Secondary | ICD-10-CM | POA: Diagnosis not present

## 2017-01-02 DIAGNOSIS — Z7901 Long term (current) use of anticoagulants: Secondary | ICD-10-CM | POA: Diagnosis not present

## 2017-01-02 DIAGNOSIS — D631 Anemia in chronic kidney disease: Secondary | ICD-10-CM | POA: Diagnosis not present

## 2017-01-02 DIAGNOSIS — M6281 Muscle weakness (generalized): Secondary | ICD-10-CM | POA: Diagnosis not present

## 2017-01-02 DIAGNOSIS — R2681 Unsteadiness on feet: Secondary | ICD-10-CM | POA: Diagnosis not present

## 2017-01-02 DIAGNOSIS — Z7952 Long term (current) use of systemic steroids: Secondary | ICD-10-CM | POA: Diagnosis not present

## 2017-01-04 DIAGNOSIS — T8610 Unspecified complication of kidney transplant: Secondary | ICD-10-CM | POA: Diagnosis not present

## 2017-01-04 DIAGNOSIS — N186 End stage renal disease: Secondary | ICD-10-CM | POA: Diagnosis not present

## 2017-01-04 DIAGNOSIS — Z992 Dependence on renal dialysis: Secondary | ICD-10-CM | POA: Diagnosis not present

## 2017-01-05 ENCOUNTER — Ambulatory Visit: Payer: Self-pay | Admitting: Pharmacist

## 2017-01-05 DIAGNOSIS — Z952 Presence of prosthetic heart valve: Secondary | ICD-10-CM

## 2017-01-05 DIAGNOSIS — N2581 Secondary hyperparathyroidism of renal origin: Secondary | ICD-10-CM | POA: Diagnosis not present

## 2017-01-05 DIAGNOSIS — I4819 Other persistent atrial fibrillation: Secondary | ICD-10-CM

## 2017-01-05 DIAGNOSIS — N186 End stage renal disease: Secondary | ICD-10-CM | POA: Diagnosis not present

## 2017-01-05 DIAGNOSIS — D631 Anemia in chronic kidney disease: Secondary | ICD-10-CM | POA: Diagnosis not present

## 2017-01-05 LAB — POCT INR: INR: 4

## 2017-01-06 DIAGNOSIS — I120 Hypertensive chronic kidney disease with stage 5 chronic kidney disease or end stage renal disease: Secondary | ICD-10-CM | POA: Diagnosis not present

## 2017-01-06 DIAGNOSIS — Z7952 Long term (current) use of systemic steroids: Secondary | ICD-10-CM | POA: Diagnosis not present

## 2017-01-06 DIAGNOSIS — N186 End stage renal disease: Secondary | ICD-10-CM | POA: Diagnosis not present

## 2017-01-06 DIAGNOSIS — R2681 Unsteadiness on feet: Secondary | ICD-10-CM | POA: Diagnosis not present

## 2017-01-06 DIAGNOSIS — D631 Anemia in chronic kidney disease: Secondary | ICD-10-CM | POA: Diagnosis not present

## 2017-01-06 DIAGNOSIS — M6281 Muscle weakness (generalized): Secondary | ICD-10-CM | POA: Diagnosis not present

## 2017-01-07 DIAGNOSIS — D631 Anemia in chronic kidney disease: Secondary | ICD-10-CM | POA: Diagnosis not present

## 2017-01-07 DIAGNOSIS — N186 End stage renal disease: Secondary | ICD-10-CM | POA: Diagnosis not present

## 2017-01-07 DIAGNOSIS — N2581 Secondary hyperparathyroidism of renal origin: Secondary | ICD-10-CM | POA: Diagnosis not present

## 2017-01-08 DIAGNOSIS — N186 End stage renal disease: Secondary | ICD-10-CM | POA: Diagnosis not present

## 2017-01-08 DIAGNOSIS — D631 Anemia in chronic kidney disease: Secondary | ICD-10-CM | POA: Diagnosis not present

## 2017-01-08 DIAGNOSIS — I120 Hypertensive chronic kidney disease with stage 5 chronic kidney disease or end stage renal disease: Secondary | ICD-10-CM | POA: Diagnosis not present

## 2017-01-08 DIAGNOSIS — R2681 Unsteadiness on feet: Secondary | ICD-10-CM | POA: Diagnosis not present

## 2017-01-08 DIAGNOSIS — M6281 Muscle weakness (generalized): Secondary | ICD-10-CM | POA: Diagnosis not present

## 2017-01-08 DIAGNOSIS — Z7952 Long term (current) use of systemic steroids: Secondary | ICD-10-CM | POA: Diagnosis not present

## 2017-01-09 DIAGNOSIS — N186 End stage renal disease: Secondary | ICD-10-CM | POA: Diagnosis not present

## 2017-01-09 DIAGNOSIS — N2581 Secondary hyperparathyroidism of renal origin: Secondary | ICD-10-CM | POA: Diagnosis not present

## 2017-01-09 DIAGNOSIS — D631 Anemia in chronic kidney disease: Secondary | ICD-10-CM | POA: Diagnosis not present

## 2017-01-10 NOTE — Progress Notes (Signed)
Pharmacy note reviewed. Agree with management and plan for Coumadin dosing.  Tommi Rumps, M.D.

## 2017-01-12 ENCOUNTER — Ambulatory Visit: Payer: Self-pay | Admitting: Pharmacist

## 2017-01-12 DIAGNOSIS — N186 End stage renal disease: Secondary | ICD-10-CM | POA: Diagnosis not present

## 2017-01-12 DIAGNOSIS — Z952 Presence of prosthetic heart valve: Secondary | ICD-10-CM

## 2017-01-12 DIAGNOSIS — N2581 Secondary hyperparathyroidism of renal origin: Secondary | ICD-10-CM | POA: Diagnosis not present

## 2017-01-12 DIAGNOSIS — D631 Anemia in chronic kidney disease: Secondary | ICD-10-CM | POA: Diagnosis not present

## 2017-01-12 DIAGNOSIS — I4819 Other persistent atrial fibrillation: Secondary | ICD-10-CM

## 2017-01-12 LAB — POCT INR: INR: 2.3

## 2017-01-12 NOTE — Progress Notes (Signed)
I agree with the pharmacist of documentation.  Tommi Rumps, M.D.

## 2017-01-13 ENCOUNTER — Encounter (HOSPITAL_COMMUNITY): Payer: Medicare Other | Attending: Hematology & Oncology

## 2017-01-13 ENCOUNTER — Encounter (HOSPITAL_COMMUNITY): Payer: Self-pay

## 2017-01-13 VITALS — BP 121/72 | HR 76 | Temp 98.7°F | Resp 18

## 2017-01-13 DIAGNOSIS — E291 Testicular hypofunction: Secondary | ICD-10-CM | POA: Diagnosis present

## 2017-01-13 DIAGNOSIS — D649 Anemia, unspecified: Secondary | ICD-10-CM | POA: Insufficient documentation

## 2017-01-13 DIAGNOSIS — D631 Anemia in chronic kidney disease: Secondary | ICD-10-CM

## 2017-01-13 DIAGNOSIS — E785 Hyperlipidemia, unspecified: Secondary | ICD-10-CM | POA: Insufficient documentation

## 2017-01-13 DIAGNOSIS — I129 Hypertensive chronic kidney disease with stage 1 through stage 4 chronic kidney disease, or unspecified chronic kidney disease: Secondary | ICD-10-CM | POA: Insufficient documentation

## 2017-01-13 DIAGNOSIS — Z7901 Long term (current) use of anticoagulants: Secondary | ICD-10-CM | POA: Insufficient documentation

## 2017-01-13 DIAGNOSIS — Z952 Presence of prosthetic heart valve: Secondary | ICD-10-CM | POA: Insufficient documentation

## 2017-01-13 DIAGNOSIS — Z940 Kidney transplant status: Secondary | ICD-10-CM | POA: Insufficient documentation

## 2017-01-13 DIAGNOSIS — M6281 Muscle weakness (generalized): Secondary | ICD-10-CM | POA: Diagnosis not present

## 2017-01-13 DIAGNOSIS — Z85528 Personal history of other malignant neoplasm of kidney: Secondary | ICD-10-CM | POA: Insufficient documentation

## 2017-01-13 DIAGNOSIS — N183 Chronic kidney disease, stage 3 unspecified: Secondary | ICD-10-CM

## 2017-01-13 DIAGNOSIS — Z79899 Other long term (current) drug therapy: Secondary | ICD-10-CM | POA: Insufficient documentation

## 2017-01-13 DIAGNOSIS — N186 End stage renal disease: Secondary | ICD-10-CM | POA: Diagnosis not present

## 2017-01-13 DIAGNOSIS — G4733 Obstructive sleep apnea (adult) (pediatric): Secondary | ICD-10-CM | POA: Insufficient documentation

## 2017-01-13 DIAGNOSIS — Z9889 Other specified postprocedural states: Secondary | ICD-10-CM | POA: Insufficient documentation

## 2017-01-13 DIAGNOSIS — Z992 Dependence on renal dialysis: Secondary | ICD-10-CM | POA: Insufficient documentation

## 2017-01-13 DIAGNOSIS — I120 Hypertensive chronic kidney disease with stage 5 chronic kidney disease or end stage renal disease: Secondary | ICD-10-CM | POA: Diagnosis not present

## 2017-01-13 DIAGNOSIS — Z7952 Long term (current) use of systemic steroids: Secondary | ICD-10-CM | POA: Diagnosis not present

## 2017-01-13 DIAGNOSIS — N189 Chronic kidney disease, unspecified: Secondary | ICD-10-CM | POA: Insufficient documentation

## 2017-01-13 DIAGNOSIS — M1990 Unspecified osteoarthritis, unspecified site: Secondary | ICD-10-CM | POA: Insufficient documentation

## 2017-01-13 DIAGNOSIS — R2681 Unsteadiness on feet: Secondary | ICD-10-CM | POA: Diagnosis not present

## 2017-01-13 MED ORDER — TESTOSTERONE CYPIONATE 200 MG/ML IM SOLN
200.0000 mg | INTRAMUSCULAR | Status: DC
  Administered 2017-01-13: 200 mg via INTRAMUSCULAR
  Filled 2017-01-13: qty 1

## 2017-01-13 NOTE — Patient Instructions (Signed)
[place] at [facility] Discharge Instructions  RECOMMENDATIONS MADE BY THE CONSULTANT AND ANY TEST RESULTS WILL BE SENT TO YOUR REFERRING PHYSICIAN.  You were given your Testosterone injection today Continue to get it monthly Follow up as scheduled.  Thank you for choosing [place] at [facility] to provide your oncology and hematology care.  To afford each patient quality time with our provider, please arrive at least 15 minutes before your scheduled appointment time.    If you have a lab appointment with the [facility] please come in thru the  Main Entrance and check in at the main information desk  You need to re-schedule your appointment should you arrive 10 or more minutes late.  We strive to give you quality time with our providers, and arriving late affects you and other patients whose appointments are after yours.  Also, if you no show three or more times for appointments you may be dismissed from the clinic at the providers discretion.     Again, thank you for choosing [facility].  Our hope is that these requests will decrease the amount of time that you wait before being seen by our physicians.       _____________________________________________________________  Should you have questions after your visit to [facility], please contact our office at [phone] between the hours of 8:30 a.m. and 4:30 p.m.  Voicemails left after 4:30 p.m. will not be returned until the following business day.  For prescription refill requests, have your pharmacy contact our office.       Resources For Cancer Patients and their Caregivers ? American Cancer Society: Can assist with transportation, wigs, general needs, runs Look Good Feel Better.        514-053-0860 ? Cancer Care: Provides financial assistance, online support groups, medication/co-pay assistance.  1-800-813-HOPE 817-171-9102) ? [facility] Assists [place] cancer patients and their families through emotional , educational and financial support.  631-567-3756 ? [place] DSS Where to apply for food stamps, Medicaid and utility assistance. 419 073 8573 ? RCATS: Transportation to medical appointments. (914) 207-2780 ? Social Security Administration: May apply for disability if have a Stage IV cancer. 351-202-1238 820-086-8940 ? [employer], Disability and Transit Services: Assists with nutrition, care and transit needs. [place] Support Programs: @10RELATIVEDAYS @ > Cancer Support Group  2nd Tuesday of the month 1pm-2pm, Journey Room  > Creative Journey  3rd Tuesday of the month 1130am-1pm, Journey Room  > Look Good Feel Better  1st Wednesday of the month 10am-12 noon, Journey Room (Call [place] to register [phone])

## 2017-01-13 NOTE — Progress Notes (Signed)
Jeremy [name] presents today for injection per MD orders. Testosterone 200 mg administered IM in right Gluteal. Administration without incident. Patient tolerated well. Patient tolerated treatment without incidence. Patient discharged ambulatory and in stable condition from clinic. Patient to follow up as scheduled.

## 2017-01-14 DIAGNOSIS — N2581 Secondary hyperparathyroidism of renal origin: Secondary | ICD-10-CM | POA: Diagnosis not present

## 2017-01-14 DIAGNOSIS — D631 Anemia in chronic kidney disease: Secondary | ICD-10-CM | POA: Diagnosis not present

## 2017-01-14 DIAGNOSIS — N186 End stage renal disease: Secondary | ICD-10-CM | POA: Diagnosis not present

## 2017-01-15 DIAGNOSIS — M6281 Muscle weakness (generalized): Secondary | ICD-10-CM | POA: Diagnosis not present

## 2017-01-15 DIAGNOSIS — N186 End stage renal disease: Secondary | ICD-10-CM | POA: Diagnosis not present

## 2017-01-15 DIAGNOSIS — D631 Anemia in chronic kidney disease: Secondary | ICD-10-CM | POA: Diagnosis not present

## 2017-01-15 DIAGNOSIS — I120 Hypertensive chronic kidney disease with stage 5 chronic kidney disease or end stage renal disease: Secondary | ICD-10-CM | POA: Diagnosis not present

## 2017-01-15 DIAGNOSIS — R2681 Unsteadiness on feet: Secondary | ICD-10-CM | POA: Diagnosis not present

## 2017-01-15 DIAGNOSIS — Z7952 Long term (current) use of systemic steroids: Secondary | ICD-10-CM | POA: Diagnosis not present

## 2017-01-16 DIAGNOSIS — N2581 Secondary hyperparathyroidism of renal origin: Secondary | ICD-10-CM | POA: Diagnosis not present

## 2017-01-16 DIAGNOSIS — N186 End stage renal disease: Secondary | ICD-10-CM | POA: Diagnosis not present

## 2017-01-16 DIAGNOSIS — D631 Anemia in chronic kidney disease: Secondary | ICD-10-CM | POA: Diagnosis not present

## 2017-01-19 ENCOUNTER — Ambulatory Visit: Payer: Self-pay | Admitting: Pharmacist

## 2017-01-19 ENCOUNTER — Telehealth: Payer: Self-pay | Admitting: Family Medicine

## 2017-01-19 DIAGNOSIS — D631 Anemia in chronic kidney disease: Secondary | ICD-10-CM | POA: Diagnosis not present

## 2017-01-19 DIAGNOSIS — N186 End stage renal disease: Secondary | ICD-10-CM | POA: Diagnosis not present

## 2017-01-19 DIAGNOSIS — N2581 Secondary hyperparathyroidism of renal origin: Secondary | ICD-10-CM | POA: Diagnosis not present

## 2017-01-19 DIAGNOSIS — I4819 Other persistent atrial fibrillation: Secondary | ICD-10-CM

## 2017-01-19 DIAGNOSIS — Z952 Presence of prosthetic heart valve: Secondary | ICD-10-CM

## 2017-01-19 LAB — POCT INR: INR: 4.8

## 2017-01-19 NOTE — Progress Notes (Signed)
Anticoagulation encounter reviewed. I agree with the pharmacist recommendations.

## 2017-01-19 NOTE — Telephone Encounter (Signed)
Jeremy [name] spoke with the patient regarding this. Please see anticoagulation note.

## 2017-01-19 NOTE — Telephone Encounter (Signed)
fyi

## 2017-01-19 NOTE — Telephone Encounter (Signed)
MDINR called with an out of range inr on this pt. Pt's inr was 4.8. Please advise, thank you!  Call @ [phone]

## 2017-01-21 DIAGNOSIS — D631 Anemia in chronic kidney disease: Secondary | ICD-10-CM | POA: Diagnosis not present

## 2017-01-21 DIAGNOSIS — N186 End stage renal disease: Secondary | ICD-10-CM | POA: Diagnosis not present

## 2017-01-21 DIAGNOSIS — N2581 Secondary hyperparathyroidism of renal origin: Secondary | ICD-10-CM | POA: Diagnosis not present

## 2017-01-22 DIAGNOSIS — D631 Anemia in chronic kidney disease: Secondary | ICD-10-CM | POA: Diagnosis not present

## 2017-01-22 DIAGNOSIS — M6281 Muscle weakness (generalized): Secondary | ICD-10-CM | POA: Diagnosis not present

## 2017-01-22 DIAGNOSIS — Z7952 Long term (current) use of systemic steroids: Secondary | ICD-10-CM | POA: Diagnosis not present

## 2017-01-22 DIAGNOSIS — I120 Hypertensive chronic kidney disease with stage 5 chronic kidney disease or end stage renal disease: Secondary | ICD-10-CM | POA: Diagnosis not present

## 2017-01-22 DIAGNOSIS — R2681 Unsteadiness on feet: Secondary | ICD-10-CM | POA: Diagnosis not present

## 2017-01-22 DIAGNOSIS — N186 End stage renal disease: Secondary | ICD-10-CM | POA: Diagnosis not present

## 2017-01-23 DIAGNOSIS — N186 End stage renal disease: Secondary | ICD-10-CM | POA: Diagnosis not present

## 2017-01-23 DIAGNOSIS — N2581 Secondary hyperparathyroidism of renal origin: Secondary | ICD-10-CM | POA: Diagnosis not present

## 2017-01-23 DIAGNOSIS — D631 Anemia in chronic kidney disease: Secondary | ICD-10-CM | POA: Diagnosis not present

## 2017-01-26 ENCOUNTER — Telehealth: Payer: Self-pay

## 2017-01-26 ENCOUNTER — Ambulatory Visit: Payer: Self-pay | Admitting: Pharmacist

## 2017-01-26 DIAGNOSIS — Z952 Presence of prosthetic heart valve: Secondary | ICD-10-CM

## 2017-01-26 DIAGNOSIS — I482 Chronic atrial fibrillation: Secondary | ICD-10-CM | POA: Diagnosis not present

## 2017-01-26 DIAGNOSIS — N2581 Secondary hyperparathyroidism of renal origin: Secondary | ICD-10-CM | POA: Diagnosis not present

## 2017-01-26 DIAGNOSIS — D631 Anemia in chronic kidney disease: Secondary | ICD-10-CM | POA: Diagnosis not present

## 2017-01-26 DIAGNOSIS — I4819 Other persistent atrial fibrillation: Secondary | ICD-10-CM

## 2017-01-26 DIAGNOSIS — N186 End stage renal disease: Secondary | ICD-10-CM | POA: Diagnosis not present

## 2017-01-26 LAB — POCT INR: INR: 1.6

## 2017-01-26 NOTE — Telephone Encounter (Signed)
Lab called. INR 1.6.

## 2017-01-26 NOTE — Progress Notes (Signed)
Agree with documentation by clinical pharmacist.

## 2017-01-26 NOTE — Telephone Encounter (Signed)
Attempted to contact patient regarding INR value. Clinical pharmacist has already spoken with him regarding this and advised him as advised in the anticoagulation visit note. I would like to speak with him when he calls back.

## 2017-01-26 NOTE — Telephone Encounter (Signed)
Please see anticoagulation encounter from today's date.   Thanks!  Chrys Racer

## 2017-01-27 ENCOUNTER — Encounter: Payer: Self-pay | Admitting: General Surgery

## 2017-01-27 DIAGNOSIS — I120 Hypertensive chronic kidney disease with stage 5 chronic kidney disease or end stage renal disease: Secondary | ICD-10-CM | POA: Diagnosis not present

## 2017-01-27 DIAGNOSIS — N186 End stage renal disease: Secondary | ICD-10-CM | POA: Diagnosis not present

## 2017-01-27 DIAGNOSIS — D631 Anemia in chronic kidney disease: Secondary | ICD-10-CM | POA: Diagnosis not present

## 2017-01-27 DIAGNOSIS — M6281 Muscle weakness (generalized): Secondary | ICD-10-CM | POA: Diagnosis not present

## 2017-01-27 DIAGNOSIS — Z7952 Long term (current) use of systemic steroids: Secondary | ICD-10-CM | POA: Diagnosis not present

## 2017-01-27 DIAGNOSIS — R2681 Unsteadiness on feet: Secondary | ICD-10-CM | POA: Diagnosis not present

## 2017-01-27 NOTE — Telephone Encounter (Signed)
Spoke with patient regarding his INR being up and down. He has had several medication changes recently and was on prednisone. He also notes he may have taken in more greens than necessary the week prior to his INR being low. He notes he feels well overall. Occasional nausea. No palpitations. Heart rate is always less than 110. He has been off of his blood pressure medications recently as well and notes his blood pressures have been relatively good and oftentimes borderline low. Following with nephrology for dialysis. Discussed continuing with the plan advised by [facility]. Advised if he develops any new symptoms being evaluated. Discussed that hopefully now that his medication regimen has stabilized his INR will stabilize as well.

## 2017-01-28 DIAGNOSIS — N186 End stage renal disease: Secondary | ICD-10-CM | POA: Diagnosis not present

## 2017-01-28 DIAGNOSIS — N2581 Secondary hyperparathyroidism of renal origin: Secondary | ICD-10-CM | POA: Diagnosis not present

## 2017-01-28 DIAGNOSIS — D631 Anemia in chronic kidney disease: Secondary | ICD-10-CM | POA: Diagnosis not present

## 2017-01-29 ENCOUNTER — Telehealth: Payer: Self-pay

## 2017-01-29 DIAGNOSIS — Z7952 Long term (current) use of systemic steroids: Secondary | ICD-10-CM | POA: Diagnosis not present

## 2017-01-29 DIAGNOSIS — N186 End stage renal disease: Secondary | ICD-10-CM | POA: Diagnosis not present

## 2017-01-29 DIAGNOSIS — R2681 Unsteadiness on feet: Secondary | ICD-10-CM | POA: Diagnosis not present

## 2017-01-29 DIAGNOSIS — D631 Anemia in chronic kidney disease: Secondary | ICD-10-CM | POA: Diagnosis not present

## 2017-01-29 DIAGNOSIS — I120 Hypertensive chronic kidney disease with stage 5 chronic kidney disease or end stage renal disease: Secondary | ICD-10-CM | POA: Diagnosis not present

## 2017-01-29 DIAGNOSIS — M6281 Muscle weakness (generalized): Secondary | ICD-10-CM | POA: Diagnosis not present

## 2017-01-29 NOTE — Telephone Encounter (Signed)
Message left for the patient to schedule a follow up appointment with Dr Bary Castilla to discuss scheduling hernia surgery.

## 2017-01-30 DIAGNOSIS — N186 End stage renal disease: Secondary | ICD-10-CM | POA: Diagnosis not present

## 2017-01-30 DIAGNOSIS — N2581 Secondary hyperparathyroidism of renal origin: Secondary | ICD-10-CM | POA: Diagnosis not present

## 2017-01-30 DIAGNOSIS — D631 Anemia in chronic kidney disease: Secondary | ICD-10-CM | POA: Diagnosis not present

## 2017-02-02 ENCOUNTER — Ambulatory Visit: Payer: Self-pay | Admitting: Pharmacist

## 2017-02-02 DIAGNOSIS — N2581 Secondary hyperparathyroidism of renal origin: Secondary | ICD-10-CM | POA: Diagnosis not present

## 2017-02-02 DIAGNOSIS — N186 End stage renal disease: Secondary | ICD-10-CM | POA: Diagnosis not present

## 2017-02-02 DIAGNOSIS — D631 Anemia in chronic kidney disease: Secondary | ICD-10-CM | POA: Diagnosis not present

## 2017-02-02 DIAGNOSIS — Z952 Presence of prosthetic heart valve: Secondary | ICD-10-CM

## 2017-02-02 DIAGNOSIS — I4819 Other persistent atrial fibrillation: Secondary | ICD-10-CM

## 2017-02-02 LAB — POCT INR: INR: 2.1

## 2017-02-04 DIAGNOSIS — N2581 Secondary hyperparathyroidism of renal origin: Secondary | ICD-10-CM | POA: Diagnosis not present

## 2017-02-04 DIAGNOSIS — Z992 Dependence on renal dialysis: Secondary | ICD-10-CM | POA: Diagnosis not present

## 2017-02-04 DIAGNOSIS — T8610 Unspecified complication of kidney transplant: Secondary | ICD-10-CM | POA: Diagnosis not present

## 2017-02-04 DIAGNOSIS — N186 End stage renal disease: Secondary | ICD-10-CM | POA: Diagnosis not present

## 2017-02-04 DIAGNOSIS — D631 Anemia in chronic kidney disease: Secondary | ICD-10-CM | POA: Diagnosis not present

## 2017-02-06 DIAGNOSIS — D631 Anemia in chronic kidney disease: Secondary | ICD-10-CM | POA: Diagnosis not present

## 2017-02-06 DIAGNOSIS — N186 End stage renal disease: Secondary | ICD-10-CM | POA: Diagnosis not present

## 2017-02-06 DIAGNOSIS — N2581 Secondary hyperparathyroidism of renal origin: Secondary | ICD-10-CM | POA: Diagnosis not present

## 2017-02-09 ENCOUNTER — Ambulatory Visit: Payer: Medicare Other | Admitting: Family Medicine

## 2017-02-09 DIAGNOSIS — N186 End stage renal disease: Secondary | ICD-10-CM | POA: Diagnosis not present

## 2017-02-09 DIAGNOSIS — D631 Anemia in chronic kidney disease: Secondary | ICD-10-CM | POA: Diagnosis not present

## 2017-02-09 DIAGNOSIS — N2581 Secondary hyperparathyroidism of renal origin: Secondary | ICD-10-CM | POA: Diagnosis not present

## 2017-02-10 ENCOUNTER — Ambulatory Visit (INDEPENDENT_AMBULATORY_CARE_PROVIDER_SITE_OTHER): Payer: Medicare Other | Admitting: Family Medicine

## 2017-02-10 ENCOUNTER — Encounter: Payer: Self-pay | Admitting: Family Medicine

## 2017-02-10 ENCOUNTER — Encounter: Payer: Self-pay | Admitting: General Surgery

## 2017-02-10 ENCOUNTER — Ambulatory Visit (INDEPENDENT_AMBULATORY_CARE_PROVIDER_SITE_OTHER): Payer: Medicare Other | Admitting: General Surgery

## 2017-02-10 VITALS — BP 122/60 | HR 74 | Resp 14 | Ht 71.0 in | Wt 187.0 lb

## 2017-02-10 VITALS — BP 106/58 | HR 54 | Temp 98.1°F | Wt 187.0 lb

## 2017-02-10 DIAGNOSIS — N189 Chronic kidney disease, unspecified: Secondary | ICD-10-CM | POA: Diagnosis not present

## 2017-02-10 DIAGNOSIS — I2510 Atherosclerotic heart disease of native coronary artery without angina pectoris: Secondary | ICD-10-CM

## 2017-02-10 DIAGNOSIS — M1A9XX0 Chronic gout, unspecified, without tophus (tophi): Secondary | ICD-10-CM | POA: Diagnosis not present

## 2017-02-10 DIAGNOSIS — Z992 Dependence on renal dialysis: Secondary | ICD-10-CM | POA: Diagnosis not present

## 2017-02-10 DIAGNOSIS — N186 End stage renal disease: Secondary | ICD-10-CM | POA: Diagnosis not present

## 2017-02-10 DIAGNOSIS — D631 Anemia in chronic kidney disease: Secondary | ICD-10-CM | POA: Diagnosis not present

## 2017-02-10 DIAGNOSIS — I481 Persistent atrial fibrillation: Secondary | ICD-10-CM

## 2017-02-10 DIAGNOSIS — E785 Hyperlipidemia, unspecified: Secondary | ICD-10-CM

## 2017-02-10 DIAGNOSIS — I4819 Other persistent atrial fibrillation: Secondary | ICD-10-CM

## 2017-02-10 DIAGNOSIS — K439 Ventral hernia without obstruction or gangrene: Secondary | ICD-10-CM | POA: Diagnosis not present

## 2017-02-10 DIAGNOSIS — T8611 Kidney transplant rejection: Secondary | ICD-10-CM

## 2017-02-10 LAB — HEPATIC FUNCTION PANEL
ALK PHOS: 141 U/L — AB (ref 39–117)
ALT: 14 U/L (ref 0–53)
AST: 19 U/L (ref 0–37)
Albumin: 4 g/dL (ref 3.5–5.2)
BILIRUBIN DIRECT: 0.1 mg/dL (ref 0.0–0.3)
TOTAL PROTEIN: 8 g/dL (ref 6.0–8.3)
Total Bilirubin: 0.6 mg/dL (ref 0.2–1.2)

## 2017-02-10 LAB — PROTIME-INR
INR: 2.5 ratio — ABNORMAL HIGH (ref 0.8–1.0)
Prothrombin Time: 26.3 s — ABNORMAL HIGH (ref 9.6–13.1)

## 2017-02-10 LAB — HEMOGLOBIN AND HEMATOCRIT, BLOOD
HEMATOCRIT: 37.4 % — AB (ref 39.0–52.0)
Hemoglobin: 12.2 g/dL — ABNORMAL LOW (ref 13.0–17.0)

## 2017-02-10 LAB — LDL CHOLESTEROL, DIRECT: Direct LDL: 113 mg/dL

## 2017-02-10 NOTE — Assessment & Plan Note (Addendum)
Continue to follow with nephrology. discussed this could be contributing to his nausea versus medication versus IBS. He will monitor his nausea.

## 2017-02-10 NOTE — Assessment & Plan Note (Signed)
Refer to rheumatology to determine if joint changes or gout related or other arthritic cause.

## 2017-02-10 NOTE — Assessment & Plan Note (Addendum)
Check hemoglobin.

## 2017-02-10 NOTE — Progress Notes (Signed)
  Tommi Rumps, MD Phone: [phone]  Jeremy [name] is a 61 y.o. male who presents today for follow-up.  A. Fib: Fairly recent diagnosis. Has had a valve replacement. On Coumadin. Goal 2.5-3.5. Taking 2 mg daily. Notes no palpitations. Highest heart rate at home is 104. He's been off of metoprolol since his nephrologist told him to come off of this. They were having issues with his blood pressure dropping too low at dialysis. They stopped the amlodipine as well. Blood pressures range 107-169/52-86. Heart rates less than 104.  Does note some occasional nausea about once a day. Occasionally occurs after dialysis. Occasionally at other times. No vomiting, diarrhea, or abdominal pain. Does have IBS. Is on dicyclomine for that. Monday Wednesday Friday dialysis schedule.  Gout: Taking allopurinol. Notes he wonders if his arthritic changes in his hands and occasional pain in his knees are related to gout or osteoarthritis. He is interested in seeing rheumatology.  He would like his hemoglobin checked for anemia related to chronic kidney disease. Also due for cholesterol recheck.  PMH: nonsmoker.   ROS see history of present illness  Objective  Physical Exam Vitals:   02/10/17 0854  BP: (!) 106/58  Pulse: (!) 54  Temp: 98.1 F (36.7 C)  SpO2: 98%    BP Readings from Last 3 Encounters:  02/10/17 (!) 106/58  01/13/17 121/72  12/16/16 (!) 148/73   Wt Readings from Last 3 Encounters:  02/10/17 187 lb (84.8 kg)  12/16/16 189 lb 1.6 oz (85.8 kg)  11/11/16 188 lb (85.3 kg)    Physical Exam  Constitutional: No distress.  Cardiovascular: Normal rate and normal heart sounds. An irregularly irregular rhythm present.  Pulmonary/Chest: Effort normal and breath sounds normal.  Musculoskeletal:  Bilateral hands with nodular deformities in PIP and DIP joints, no erythema or tenderness,bilateral knees with no tenderness, warmth, swelling, or erythema, no ligamentous laxity, negative  McMurray's  Neurological: He is alert. Gait normal.  Skin: Skin is warm and dry. He is not diaphoretic.  good bruits in left fistula   Assessment/Plan: Please see individual problem list.  Atrial fibrillation ([place]) [I48.91] INR has been intermittently off since patient was on prednisone. Improved at home today. We'll check in the office to compare.Determine next step in treatment based on INR from office. patient was advised to contact his cardiology office to let them now that the nephrologist took him off of metoprolol. Rates have been controlled off of this though he may benefit from a very low dose to ensure rate control. He will discuss further with them.  ESRD on dialysis [facility]) Continue to follow with nephrology. discussed this could be contributing to his nausea versus medication versus IBS. He will monitor his nausea.  Anemia in chronic kidney disease Check hemoglobin.  Gout Refer to rheumatology to determine if joint changes or gout related or other arthritic cause.  Hyperlipidemia Check lab work.   Orders Placed This Encounter  Procedures  . INR/PT  . Hemoglobin and hematocrit, blood  . Direct LDL  . Hepatic function panel  . Ambulatory referral to Rheumatology    Referral Priority:   Routine    Referral Type:   Consultation    Referral Reason:   Specialty Services Required    Requested Specialty:   Rheumatology    Number of Visits Requested:   [place], MD [place]

## 2017-02-10 NOTE — Progress Notes (Signed)
Patient ID: Jeremy [name], male   DOB: January 16, 1956, 61 y.o.   MRN: [id number]  Chief Complaint  Patient presents with  . Follow-up    ventral hernia     HPI [name] is a 61 y.o. male here today to discussed ventral hernia repair.  Since the patient's last visit in August 2018 he is been diagnosed with atrial fibrillation, he was already on oral anticoagulation for prosthetic aortic valve placed for stenosis.  The patient reports his chronic rejection symptoms manifest by fever and chills have resolved since his last visit.  Patient reports that there is no plan to explant the previously transplanted kidney.  He is on low-dose prednisone therapy.  HPI  Past Medical History:  Diagnosis Date  . 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 Arthritis   . Chronic anticoagulation 06/2012  . Dialysis patient [facility]) 01 01 2008  . Hx of colonoscopy    2009 by Dr. Laural Golden. Normal except for small submucosal lipoma. No evidence of polyps or colitis.  . Hyperlipidemia   . Hypertension   . Hypogonadism in male 07/25/2015  . 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  Marland Kitchen OSA (obstructive sleep apnea)   . Renal cell carcinoma ([place]) 08/04/2016    Past Surgical History:  Procedure Laterality Date  . AORTIC VALVE REPLACEMENT  3 /11 /2014   At [name]    . CORONARY ANGIOPLASTY WITH STENT PLACEMENT    . DG AV DIALYSIS  SHUNT ACCESS EXIST*L* OR     left arm  . HERNIA REPAIR     UMBILICAL HERNIA  . KIDNEY TRANSPLANT     TEE WITHOUT CARDIOVERSION N/A 07/21/2016    Procedure: TRANSESOPHAGEAL ECHOCARDIOGRAM (TEE);  Surgeon: Wellington Hampshire, MD;  Location: [facility] ORS;  Service: Cardiovascular;  Laterality: N/A;     Family History  Problem Relation Age of Onset  . Cancer Mother      pancreatic  . Heart disease Father   . Diabetes Father     Social History Social History   Tobacco Use  . Smoking status: Never Smoker  . Smokeless tobacco: Never Used  Substance Use Topics  . Alcohol use: No  . Drug use: No    Allergies  Allergen Reactions  . Statins     Other reaction(s): Arthralgias (intolerance), Myalgias (intolerance)  . Atorvastatin Other (See Comments)    Muscle Aches  . Sertraline Rash    Current Outpatient Medications  Medication Sig Dispense Refill  . acetaminophen (TYLENOL) 325 MG tablet Take 650 mg by mouth every 6 (six) hours as needed.    Marland Kitchen allopurinol (ZYLOPRIM) 100 MG tablet Take 200 mg by mouth daily.     . cinacalcet (SENSIPAR) 60 MG tablet Take 60 mg by mouth every other day.     . dicyclomine (BENTYL) 10 MG capsule Take 2 capsules (20 mg total) by mouth 3 (three) times daily before meals. (Patient taking differently: Take 20 mg by mouth 2 (two) times daily. ) 90 capsule 4  . ezetimibe (ZETIA) 10 MG tablet Take 1 tablet (10 mg total) by mouth daily. 90 tablet 3  . lanthanum (FOSRENOL) 1000 MG chewable tablet Chew 2,000 mg by mouth.    . loratadine (CLARITIN) 10 MG tablet Take 10 mg by mouth  daily as needed. Reported on 06/12/2015    . nitroGLYCERIN (NITROSTAT) 0.4 MG SL tablet Place 1 tablet (0.4 mg total) under the tongue every 5 (five) minutes as needed for chest pain. 25 tablet 2  . Omega-3 1000 MG CAPS Take 3 g by mouth.    Marland Kitchen omeprazole (PRILOSEC) 20 MG capsule Take 20 mg by mouth daily.    Marland Kitchen rOPINIRole (REQUIP) 0.25 MG tablet Take 0.25 mg by mouth.    . warfarin (COUMADIN) 1 MG tablet Take 3 tablets (3 mg) by mouth on Mondays and Wednesday. Take 2 tablets (2 mg) by mouth all other days. 68 tablet 3   No current facility-administered medications for this visit.     Review of Systems Review of Systems  Constitutional: Negative.   Respiratory: Negative.   Cardiovascular: Negative.     Blood pressure 122/60, pulse 74, resp. rate  14, height 5\' 11"  (1.803 m), weight 187 lb (84.8 kg).  Physical Exam Physical Exam  Constitutional: He is oriented to person, place, and time. He appears well-developed and well-nourished.  Cardiovascular: Normal rate, regular rhythm and normal heart sounds.  Pulmonary/Chest: Effort normal and breath sounds normal.  Abdominal: Soft. Normal appearance and bowel sounds are normal. A hernia (Epigastric hernia 7 cm up) is present.  Neurological: He is alert and oriented to person, place, and time.  Skin: Skin is warm and dry.    Data Reviewed Laboratory studies dated December 16, 2016 showed a creatinine of 7.93, estimated GFR 7.  Electrolytes were normal with the exception of mild depression of the chloride at 99.  Potassium 4.7.  Stable over the last 5 months. CBC of the same date showed a hemoglobin of 10.6 with an MCV of 104.  White blood cell count 8000.  Platelet count 185,000.  Laboratory studies dated February 10, 2017 showed a hemoglobin of 12.2. Pro time INR: 2.5  Cardiology note from Curly Rim, MD dated December 23, 2016 included an ECG with resolution of previously noted T wave inversion in the lateral leads.  Dyspnea on exertion thought related to anemia.  No evidence of coronary ischemia.  Stable aortic valve prosthesis based on transthoracic echocardiogram of July 2017.  Assessment    Epigastric hernia.  Plans for peritoneal dialysis.    Plan    I was not able to speak with Leotis Pain, MD from vascular until after the patient had left the office.  Plans at present are for placement of a peritoneal dialysis catheter, laparoscopic approach at the time of his epigastric hernia repair.  The patient will not require cessation of his oral anticoagulation or bridge therapy.  Dr. Lucky Cowboy does not require the patient to return to the office for preprocedure visit.     Hernia precautions and incarceration were discussed with the patient. If they develop symptoms of an  incarcerated hernia, they were encouraged to seek prompt medical attention.  I have recommended repair of the hernia using mesh on an outpatient basis in the near future. The risk of infection was reviewed. The role of prosthetic mesh to minimize the risk of recurrence was reviewed. HPI, Physical Exam, Assessment and Plan have been scribed under the direction and in the presence of Hervey Ard, MD.  Gaspar Cola, CMA  I have completed the exam and reviewed the above documentation for accuracy and completeness.  I agree with the above.  Haematologist has been used and any errors in dictation or transcription are unintentional.  Hervey Ard, M.D., F.A.C.S.  Robert Bellow 02/11/2017, 6:46 AM

## 2017-02-10 NOTE — Assessment & Plan Note (Addendum)
INR has been intermittently off since patient was on prednisone. Improved at home today. We'll check in the office to compare.Determine next step in treatment based on INR from office. patient was advised to contact his cardiology office to let them now that the nephrologist took him off of metoprolol. Rates have been controlled off of this though he may benefit from a very low dose to ensure rate control. He will discuss further with them.

## 2017-02-10 NOTE — Assessment & Plan Note (Signed)
Check lab work. 

## 2017-02-10 NOTE — Patient Instructions (Signed)
Nice to see you. Please contact your cardiologist and let them know that the nephrologist took you off metoprolol. You need to relay your heart rates and blood pressures. I think you may benefit from a very small dose of metoprolol to maintain rate control. Please monitor the nausea. This may be related to your renal disease though could also be related to IBS.  We'll check lab work and contact you with the results. We will get you is seen by rheumatology as well.

## 2017-02-10 NOTE — Patient Instructions (Signed)
Hernia, Adult A hernia is the bulging of an organ or tissue through a weak spot in the muscles of the abdomen (abdominal wall). Hernias develop most often near the navel or groin. There are many kinds of hernias. Common kinds include:  Femoral hernia. This kind of hernia develops under the groin in the upper thigh area.  Inguinal hernia. This kind of hernia develops in the groin or scrotum.  Umbilical hernia. This kind of hernia develops near the navel.  Hiatal hernia. This kind of hernia causes part of the stomach to be pushed up into the chest.  Incisional hernia. This kind of hernia bulges through a scar from an abdominal surgery.  What are the causes? This condition may be caused by:  Heavy lifting.  Coughing over a long period of time.  Straining to have a bowel movement.  An incision made during an abdominal surgery.  A birth defect (congenital defect).  Excess weight or obesity.  Smoking.  Poor nutrition.  Cystic fibrosis.  Excess fluid in the abdomen.  Undescended testicles.  What are the signs or symptoms? Symptoms of a hernia include:  A lump on the abdomen. This is the first sign of a hernia. The lump may become more obvious with standing, straining, or coughing. It may get bigger over time if it is not treated or if the condition causing it is not treated.  Pain. A hernia is usually painless, but it may become painful over time if treatment is delayed. The pain is usually dull and may get worse with standing or lifting heavy objects.  Sometimes a hernia gets tightly squeezed in the weak spot (strangulated) or stuck there (incarcerated) and causes additional symptoms. These symptoms may include:  Vomiting.  Nausea.  Constipation.  Irritability.  How is this diagnosed? A hernia may be diagnosed with:  A physical exam. During the exam your health care provider may ask you to cough or to make a specific movement, because a hernia is usually more  visible when you move.  Imaging tests. These can include: ? X-rays. ? Ultrasound. ? CT scan.  How is this treated? A hernia that is small and painless may not need to be treated. A hernia that is large or painful may be treated with surgery. Inguinal hernias may be treated with surgery to prevent incarceration or strangulation. Strangulated hernias are always treated with surgery, because lack of blood to the trapped organ or tissue can cause it to die. Surgery to treat a hernia involves pushing the bulge back into place and repairing the weak part of the abdomen. Follow these instructions at home:  Avoid straining.  Do not lift anything heavier than 10 lb (4.5 kg).  Lift with your leg muscles, not your back muscles. This helps avoid strain.  When coughing, try to cough gently.  Prevent constipation. Constipation leads to straining with bowel movements, which can make a hernia worse or cause a hernia repair to break down. You can prevent constipation by: ? Eating a high-fiber diet that includes plenty of fruits and vegetables. ? Drinking enough fluids to keep your urine clear or pale yellow. Aim to drink 6-8 glasses of water per day. ? Using a stool softener as directed by your health care provider.  Lose weight, if you are overweight.  Do not use any tobacco products, including cigarettes, chewing tobacco, or electronic cigarettes. If you need help quitting, ask your health care provider.  Keep all follow-up visits as directed by your health care   provider. This is important. Your health care provider may need to monitor your condition. Contact a health care provider if:  You have swelling, redness, and pain in the affected area.  Your bowel habits change. Get help right away if:  You have a fever.  You have abdominal pain that is getting worse.  You feel nauseous or you vomit.  You cannot push the hernia back in place by gently pressing on it while you are lying  down.  The hernia: ? Changes in shape or size. ? Is stuck outside the abdomen. ? Becomes discolored. ? Feels hard or tender. This information is not intended to replace advice given to you by your health care provider. Make sure you discuss any questions you have with your health care provider. Document Released: 03/24/2005 Document Revised: 08/22/2015 Document Reviewed: 02/01/2014 Elsevier Interactive Patient Education  2017 [employer]vier Inc.  

## 2017-02-11 ENCOUNTER — Ambulatory Visit (HOSPITAL_COMMUNITY): Payer: Medicare Other

## 2017-02-11 ENCOUNTER — Telehealth: Payer: Self-pay | Admitting: *Deleted

## 2017-02-11 DIAGNOSIS — D631 Anemia in chronic kidney disease: Secondary | ICD-10-CM | POA: Diagnosis not present

## 2017-02-11 DIAGNOSIS — T8611 Kidney transplant rejection: Secondary | ICD-10-CM | POA: Insufficient documentation

## 2017-02-11 DIAGNOSIS — N186 End stage renal disease: Secondary | ICD-10-CM | POA: Diagnosis not present

## 2017-02-11 DIAGNOSIS — N2581 Secondary hyperparathyroidism of renal origin: Secondary | ICD-10-CM | POA: Diagnosis not present

## 2017-02-11 NOTE — Telephone Encounter (Signed)
Message left on cell phone for patient to call the office.   We are trying to coordinate surgery with Dr. Bary Castilla and Dr. Lucky Cowboy for the patient on Thursday, 02-19-17 at [facility].   Dr. Lucky Cowboy does not need to see the patient in the office pre-op.   Patient to continue coumadin.   We will be arranging for the patient to see pre-admission testing at [facility].

## 2017-02-11 NOTE — Telephone Encounter (Signed)
Patient called back and is aware of instructions

## 2017-02-12 ENCOUNTER — Encounter (HOSPITAL_COMMUNITY): Payer: Self-pay

## 2017-02-12 ENCOUNTER — Encounter (HOSPITAL_COMMUNITY): Payer: Medicare Other | Attending: Hematology & Oncology

## 2017-02-12 VITALS — BP 128/43 | HR 88 | Temp 97.3°F | Resp 16

## 2017-02-12 DIAGNOSIS — Z7901 Long term (current) use of anticoagulants: Secondary | ICD-10-CM | POA: Insufficient documentation

## 2017-02-12 DIAGNOSIS — N183 Chronic kidney disease, stage 3 unspecified: Secondary | ICD-10-CM

## 2017-02-12 DIAGNOSIS — I129 Hypertensive chronic kidney disease with stage 1 through stage 4 chronic kidney disease, or unspecified chronic kidney disease: Secondary | ICD-10-CM | POA: Insufficient documentation

## 2017-02-12 DIAGNOSIS — N186 End stage renal disease: Secondary | ICD-10-CM

## 2017-02-12 DIAGNOSIS — D631 Anemia in chronic kidney disease: Secondary | ICD-10-CM

## 2017-02-12 DIAGNOSIS — E785 Hyperlipidemia, unspecified: Secondary | ICD-10-CM | POA: Insufficient documentation

## 2017-02-12 DIAGNOSIS — Z992 Dependence on renal dialysis: Secondary | ICD-10-CM | POA: Insufficient documentation

## 2017-02-12 DIAGNOSIS — M1990 Unspecified osteoarthritis, unspecified site: Secondary | ICD-10-CM | POA: Insufficient documentation

## 2017-02-12 DIAGNOSIS — N189 Chronic kidney disease, unspecified: Secondary | ICD-10-CM | POA: Insufficient documentation

## 2017-02-12 DIAGNOSIS — Z9889 Other specified postprocedural states: Secondary | ICD-10-CM | POA: Insufficient documentation

## 2017-02-12 DIAGNOSIS — D649 Anemia, unspecified: Secondary | ICD-10-CM | POA: Insufficient documentation

## 2017-02-12 DIAGNOSIS — Z952 Presence of prosthetic heart valve: Secondary | ICD-10-CM | POA: Insufficient documentation

## 2017-02-12 DIAGNOSIS — E291 Testicular hypofunction: Secondary | ICD-10-CM

## 2017-02-12 DIAGNOSIS — G4733 Obstructive sleep apnea (adult) (pediatric): Secondary | ICD-10-CM | POA: Insufficient documentation

## 2017-02-12 DIAGNOSIS — Z79899 Other long term (current) drug therapy: Secondary | ICD-10-CM | POA: Insufficient documentation

## 2017-02-12 DIAGNOSIS — Z940 Kidney transplant status: Secondary | ICD-10-CM | POA: Insufficient documentation

## 2017-02-12 DIAGNOSIS — Z85528 Personal history of other malignant neoplasm of kidney: Secondary | ICD-10-CM | POA: Insufficient documentation

## 2017-02-12 MED ORDER — TESTOSTERONE CYPIONATE 200 MG/ML IM SOLN
200.0000 mg | INTRAMUSCULAR | Status: DC
  Administered 2017-02-12: 200 mg via INTRAMUSCULAR
  Filled 2017-02-12: qty 1

## 2017-02-12 NOTE — Progress Notes (Signed)
Patient tolerated testosterone shot with no complaints voiced.  Injection site clean and dry with no bruising or swelling noted at site.  Band aid applied.  VSS with discharge and left ambulatory with no s/s of distress noted.

## 2017-02-12 NOTE — Patient Instructions (Signed)
[place] at [facility]  Discharge Instructions:  You received a testosterone shot today.  _______________________________________________________________  Thank you for choosing [place] at [facility] to provide your oncology and hematology care.  To afford each patient quality time with our providers, please arrive at least 15 minutes before your scheduled appointment.  You need to re-schedule your appointment if you arrive 10 or more minutes late.  We strive to give you quality time with our providers, and arriving late affects you and other patients whose appointments are after yours.  Also, if you no show three or more times for appointments you may be dismissed from the clinic.  Again, thank you for choosing [place] at [place] hope is that these requests will allow you access to exceptional care and in a timely manner. _______________________________________________________________  If you have questions after your visit, please contact our office at [phone] between the hours of 8:30 a.m. and 5:00 p.m. Voicemails left after 4:30 p.m. will not be returned until the following business day. _______________________________________________________________  For prescription refill requests, have your pharmacy contact our office. _______________________________________________________________  Recommendations made by the consultant and any test results will be sent to your referring physician. _______________________________________________________________

## 2017-02-13 DIAGNOSIS — N186 End stage renal disease: Secondary | ICD-10-CM | POA: Diagnosis not present

## 2017-02-13 DIAGNOSIS — N2581 Secondary hyperparathyroidism of renal origin: Secondary | ICD-10-CM | POA: Diagnosis not present

## 2017-02-13 DIAGNOSIS — D631 Anemia in chronic kidney disease: Secondary | ICD-10-CM | POA: Diagnosis not present

## 2017-02-16 ENCOUNTER — Inpatient Hospital Stay: Admission: RE | Admit: 2017-02-16 | Payer: Medicare Other | Source: Ambulatory Visit

## 2017-02-16 ENCOUNTER — Telehealth: Payer: Self-pay | Admitting: Internal Medicine

## 2017-02-16 ENCOUNTER — Ambulatory Visit: Payer: Self-pay | Admitting: Pharmacist

## 2017-02-16 ENCOUNTER — Encounter: Payer: Self-pay | Admitting: Family Medicine

## 2017-02-16 DIAGNOSIS — Z952 Presence of prosthetic heart valve: Secondary | ICD-10-CM

## 2017-02-16 DIAGNOSIS — N186 End stage renal disease: Secondary | ICD-10-CM | POA: Diagnosis not present

## 2017-02-16 DIAGNOSIS — I4819 Other persistent atrial fibrillation: Secondary | ICD-10-CM

## 2017-02-16 DIAGNOSIS — N2581 Secondary hyperparathyroidism of renal origin: Secondary | ICD-10-CM | POA: Diagnosis not present

## 2017-02-16 DIAGNOSIS — D631 Anemia in chronic kidney disease: Secondary | ICD-10-CM | POA: Diagnosis not present

## 2017-02-16 LAB — POCT INR
INR: 3.4
INR: 3.4 — AB (ref 0.9–1.1)

## 2017-02-16 NOTE — Telephone Encounter (Signed)
Called patient given all information. Patient states that he checks every Monday and calls office with results. And will do that then as well.

## 2017-02-16 NOTE — Telephone Encounter (Signed)
  Your INR/coumadin level is therapeutic,  Continue current regimen and repeat PT/INR in one month.   

## 2017-02-17 ENCOUNTER — Encounter
Admission: RE | Admit: 2017-02-17 | Discharge: 2017-02-17 | Disposition: A | Discharge status: Home / Self Care | Payer: Medicare Other | Source: Ambulatory Visit | Attending: General Surgery | Admitting: General Surgery

## 2017-02-17 ENCOUNTER — Other Ambulatory Visit: Payer: Self-pay

## 2017-02-17 DIAGNOSIS — Z992 Dependence on renal dialysis: Secondary | ICD-10-CM | POA: Diagnosis not present

## 2017-02-17 DIAGNOSIS — Z940 Kidney transplant status: Secondary | ICD-10-CM | POA: Diagnosis not present

## 2017-02-17 DIAGNOSIS — G4730 Sleep apnea, unspecified: Secondary | ICD-10-CM | POA: Diagnosis not present

## 2017-02-17 DIAGNOSIS — Z952 Presence of prosthetic heart valve: Secondary | ICD-10-CM | POA: Diagnosis not present

## 2017-02-17 DIAGNOSIS — Z955 Presence of coronary angioplasty implant and graft: Secondary | ICD-10-CM | POA: Diagnosis not present

## 2017-02-17 DIAGNOSIS — K439 Ventral hernia without obstruction or gangrene: Secondary | ICD-10-CM | POA: Diagnosis not present

## 2017-02-17 DIAGNOSIS — N186 End stage renal disease: Secondary | ICD-10-CM | POA: Diagnosis not present

## 2017-02-17 DIAGNOSIS — Z7901 Long term (current) use of anticoagulants: Secondary | ICD-10-CM | POA: Diagnosis not present

## 2017-02-17 DIAGNOSIS — Z7952 Long term (current) use of systemic steroids: Secondary | ICD-10-CM | POA: Diagnosis not present

## 2017-02-17 DIAGNOSIS — G4733 Obstructive sleep apnea (adult) (pediatric): Secondary | ICD-10-CM | POA: Diagnosis not present

## 2017-02-17 DIAGNOSIS — Z79899 Other long term (current) drug therapy: Secondary | ICD-10-CM | POA: Diagnosis not present

## 2017-02-17 DIAGNOSIS — I4891 Unspecified atrial fibrillation: Secondary | ICD-10-CM | POA: Diagnosis not present

## 2017-02-17 DIAGNOSIS — Z85528 Personal history of other malignant neoplasm of kidney: Secondary | ICD-10-CM | POA: Diagnosis not present

## 2017-02-17 DIAGNOSIS — I120 Hypertensive chronic kidney disease with stage 5 chronic kidney disease or end stage renal disease: Secondary | ICD-10-CM | POA: Diagnosis not present

## 2017-02-17 HISTORY — DX: Cardiac arrhythmia, unspecified: I49.9

## 2017-02-17 NOTE — Patient Instructions (Signed)
Your procedure is scheduled on: Thurs. 11/15 Report to Day Surgery. To find out your arrival time please call [phone] between 1PM - 3PM on Wed. 11/14.  Remember: Instructions that are not followed completely may result in serious medical risk, up to and including death, or upon the discretion of your surgeon and anesthesiologist your surgery may need to be rescheduled.     _X__ 1. Do not eat food after midnight the night before your procedure.                 No gum chewing or hard candies. You may drink clear liquids up to 2 hours                 before you are scheduled to arrive for your surgery- DO not drink clear                 liquids within 2 hours of the start of your surgery.                 Clear Liquids include:  water, apple juice without pulp, clear carbohydrate                 drink such as Clearfast of Gart[place]ade, Black Coffee or Tea (Do not add                 anything to coffee or tea).     ___ 2.  No Alcohol for 24 hours before or after surgery.   ___ 3.  Do Not Smoke or use e-cigarettes For 24 Hours Prior to Your Surgery.                 Do not use any chewable tobacco products for at least 6 hours prior to                 surgery.  ____  4.  Bring all medications with you on the day of surgery if instructed.   __x__  5.  Notify your doctor if there is any change in your medical condition      (cold, fever, infections).     Do not wear jewelry, make-up, hairpins, clips or nail polish. Do not wear lotions, powders, or perfumes. You may wear deodorant. Do not shave 48 hours prior to surgery. Men may shave face and neck. Do not bring valuables to the hospital.    [facility] is not responsible for any belongings or valuables.  Contacts, dentures or bridgework may not be worn into surgery. Leave your suitcase in the car. After surgery it may be brought to your room. For patients admitted to the hospital, discharge time is determined by  your treatment team.   Patients discharged the day of surgery will not be allowed to drive home.   Please read over the following fact sheets that you were given:    _x___ Take these medicines the morning of surgery with A SIP OF WATER:    1. acetaminophen (TYLENOL) 500 MG tablet if needed  2. omeprazole (PRILOSEC) 20 MG capsule Take extra dose the night before and morning of surgery  3.   4.  5.  6.  ____ Fleet Enema (as directed)   ____ Use CHG Soap as directed  ____ Use inhalers on the day of surgery  ____ Stop metformin 2 days prior to surgery    ____ Take 1/2 of usual insulin dose the night before surgery. No insulin the morning  of surgery.   ____ Continue Coumadin per MD instruction __x__ Stop Anti-inflammatories today   __x__ Stop supplements until after surgery.  Omega-3 Fatty Acids (FISH OIL) 1200 MG CAPS  ____ Bring C-Pap to the hospital.

## 2017-02-17 NOTE — Pre-Procedure Instructions (Signed)
Progress Notes - in this encounter  Cedric Fishman, MD - 12/23/2016 11:00 AM EDT Formatting of this note may be different from the original. PROBLEM LIST:  1. Aortic stenosis A. 2D echo ([facility], 04/30/06): moderate concentric LVH, basal septal hypertrophy, aortic sclerosis with peak AVG 18 mm, mean AVG 10 mm, no AI, PA 52 mm, ejection fraction 70%. B. TTE (10-17-10): The aortic valve is calcified with reduced excursion, mean gradient of 69mmHg and a dimensionless index of 0.44 all consistent with mild aortic stenosis. EF 58% C. TTE 05/27/12 There is mild concentric left ventricular hypertrophy. Left ventricular systolic function is normal. LV ejection fraction = 55-60%. Diffuse thickening of the aortic valve with restricted cusp opening. Moderate aortic stenosis with mean gradient of 85mmHg. The high mean gradient  in the severe AS range is due to high flow state supported by the high LVOT  VTI of 34cm and a dimensionless index of 0.29 There is mild mitral regurgitation. There is mild to moderate mitral annular calcification. Mild pulmonary hypertension. There is no pericardial effusion. Compared to prior study dated 10/17/10, there has been an increase in the severity of the aortic stenosis.  D. Cardiac Cath (05/31/12): Diffuse non obsturctive CAD, mild to mod Ejection fraction: 55%  Aortic valve: Degenerative (senile) Stenosis  Peak gradient: 90 mmHg  Mean gradient: 48 mmHg  Valve area: 0.3 cm2  Insufficiency: None   E. AVR surgery ([facility],June 15, 2012):  Minimal access (upper hemisternotomy) Aortic valve replacement with a 21 mm St. Jude Regent mechanical valve   F. TTE (02/12/15): The left ventricle is borderline dilated. Mild left ventricular hypertrophy.  Mild left ventricular hypertrophy Left ventricular systolic function is normal.  LV ejection fraction = 55-60%. Left ventricular filling pattern is restrictive. The right ventricle is normal size. There is mild right  ventricular hypertrophy. The left atrium is moderately to severely dilated. The right atrium is mildly dilated. There is 21 mm St. Jude's bileaflet mechanical valve in aortic position which is well seated without perivalvular regurgitation.  There is a transvalvular peak gradient of 55 mmHg and mean gradient of 27 mmHg. Vmax 3.7 m/s.  The DI is 0.39. There is mild tricuspid regurgitation. Estimated right ventricular systolic pressure is 44 mmHg. Mild pulmonary hypertension. Systolic blunting was observed in the RUPV spectral Doppler flow pattern. There is no pericardial effusion. The LVIDd has slightly increase (5.1 to 5.4) and prosthetic transvalvular gradients have slightly increased (mean 24 to 27 and peak 50 to 55) compared with TTE dated 10/30/2014  G. TTE (10/22/15): The left ventricular size is normal. There is mild concentric left ventricular hypertrophy. LV ejection fraction = 50-55%. Left ventricular systolic function is low normal. The right ventricle is normal in size and function. The left atrium is moderately dilated. The right atrium is mildly dilated. There is a mechanical aortic valve. Aortic valve mean pressure gradient is 31.0 mmHg. There is mild to moderate aortic regurgitation. There is mild to moderate mitral annular calcification. There is mild mitral regurgitation. There is mild tricuspid regurgitation. Moderate pulmonary hypertension. There is no pericardial effusion.  2. Coronary atherosclerotic heart disease A. Exercise stress echo ([facility], 05/21/06): 8.3 METS, no chest pain, nonspecific EKG, ejection fraction 63%, peak heart rate subtarget, no evidence of inducible ischemia. B. Exercise stress echo ([facility], 07/14/07): 9.5 METS, no chest pain, nonspecific EKG, ejection fraction 55%, subtarget heart rate, no evidence of inducible ischemia. C. Exercise stress echo ([facility], 12/14/08): 9.8 METS, no chest pain, borderline EKG, EF 56%,  no evidence of inducible ischemia. D.  Cardiac cath (05/19/2011): Via right femoral artery due to upper extremity AV fistulas for dialysis. 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 Culprit lesion appears to be 85% mid LAD treated with 2.75/28 mm Xience stent post dilated with [place] high pressure 2.75 mm balloon. E. Subsequent right femoral hematoma requiring readmission - managed with conservative therapy. F. Nuclear stress (07/15/13):  The inferior wall is not well seen, however this appears worse on rest images and may reflect diaphragmatic attenuation or infarct. Otherwise, there is no evidence of ischemia.  No wall motion abnormalities.   Ejection fraction is 55%.  3. End stage renal disease A. IgA nephropathy B. Renal transplantation (1999) C. 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 D. Chronic hemodialysis E. Renal transplant surgery (12/06/2012, Dr. Linward Foster): PREOPERATIVE DIAGNOSIS: End-stage renal disease secondary to renal cell carcinoma having mandated bilateral nephrectomy and removal of first deceased donor renal graft performed in 1999 for IgA nephropathy (lasted 9 year)  POSTOPERATIVE DIAGNOSIS: Same as above  PROCEDURES:  1.Right deceased donor kidney transplant into right iliac fossa  2. Core needle reperfusion biopsy of renal allograft with repair: Yes  COMPLEX PROCEDURES 22 MODIFIER: Yes due to previous surgeries and drug-induced coagulopathy (patient on coumadin due to artificial aortic valve) requiring 5 units of FFP and tranexamic acid (10 mg pro kg)   SUBJECTIVE:  Patient was seen by [name] in July and was to have a follow-up appointment in October. He showed up at clinic today - misread his appointment date - but still wanted to be seen. We worked him into the schedule.  Of note, patient was discharged from Nephrology on 10/12/16 for concern for transplant rejection found to have thrombus in renal graft vein s/p embolectomy. Prior to admission, patient  was seen in transplant clinic whereby HR was irregular therefore EKG was obtained which revealed new AF. Currently on Coumadin and Coreg. The patient denied chest pain, PND, orthopnea, or pedal edema. He has chronic DOE. The patient has experienced palpitations and dizziness, but not syncope.Since discharge, had episode of fluttering and palpitations for a few seconds. Also endorsing to DOE which is not new with his known anemia.   Cardiology follow-up visit in 4 months.

## 2017-02-18 DIAGNOSIS — N186 End stage renal disease: Secondary | ICD-10-CM | POA: Diagnosis not present

## 2017-02-18 DIAGNOSIS — N2581 Secondary hyperparathyroidism of renal origin: Secondary | ICD-10-CM | POA: Diagnosis not present

## 2017-02-18 DIAGNOSIS — D631 Anemia in chronic kidney disease: Secondary | ICD-10-CM | POA: Diagnosis not present

## 2017-02-18 MED ORDER — CEFAZOLIN SODIUM-DEXTROSE 2-4 GM/100ML-% IV SOLN
2.0000 g | INTRAVENOUS | Status: AC
  Administered 2017-02-19: 2 g via INTRAVENOUS

## 2017-02-19 ENCOUNTER — Ambulatory Visit
Admission: RE | Admit: 2017-02-19 | Discharge: 2017-02-19 | Disposition: A | Discharge status: Home / Self Care | Payer: Medicare Other | Source: Ambulatory Visit | Attending: General Surgery | Admitting: General Surgery

## 2017-02-19 ENCOUNTER — Ambulatory Visit: Payer: Medicare Other | Admitting: Anesthesiology

## 2017-02-19 ENCOUNTER — Encounter
Admission: RE | Disposition: A | Discharge status: Home / Self Care | Payer: Self-pay | Source: Ambulatory Visit | Attending: General Surgery

## 2017-02-19 ENCOUNTER — Other Ambulatory Visit: Payer: Self-pay

## 2017-02-19 DIAGNOSIS — Z955 Presence of coronary angioplasty implant and graft: Secondary | ICD-10-CM | POA: Insufficient documentation

## 2017-02-19 DIAGNOSIS — I120 Hypertensive chronic kidney disease with stage 5 chronic kidney disease or end stage renal disease: Secondary | ICD-10-CM | POA: Insufficient documentation

## 2017-02-19 DIAGNOSIS — Z85528 Personal history of other malignant neoplasm of kidney: Secondary | ICD-10-CM | POA: Insufficient documentation

## 2017-02-19 DIAGNOSIS — G4730 Sleep apnea, unspecified: Secondary | ICD-10-CM | POA: Diagnosis not present

## 2017-02-19 DIAGNOSIS — K439 Ventral hernia without obstruction or gangrene: Secondary | ICD-10-CM | POA: Diagnosis not present

## 2017-02-19 DIAGNOSIS — I10 Essential (primary) hypertension: Secondary | ICD-10-CM | POA: Diagnosis not present

## 2017-02-19 DIAGNOSIS — N186 End stage renal disease: Secondary | ICD-10-CM | POA: Diagnosis not present

## 2017-02-19 DIAGNOSIS — Z992 Dependence on renal dialysis: Secondary | ICD-10-CM | POA: Insufficient documentation

## 2017-02-19 DIAGNOSIS — Z940 Kidney transplant status: Secondary | ICD-10-CM | POA: Insufficient documentation

## 2017-02-19 DIAGNOSIS — I4891 Unspecified atrial fibrillation: Secondary | ICD-10-CM | POA: Insufficient documentation

## 2017-02-19 DIAGNOSIS — M1990 Unspecified osteoarthritis, unspecified site: Secondary | ICD-10-CM | POA: Diagnosis not present

## 2017-02-19 DIAGNOSIS — Z952 Presence of prosthetic heart valve: Secondary | ICD-10-CM | POA: Insufficient documentation

## 2017-02-19 DIAGNOSIS — Z7952 Long term (current) use of systemic steroids: Secondary | ICD-10-CM | POA: Insufficient documentation

## 2017-02-19 DIAGNOSIS — T8611 Kidney transplant rejection: Secondary | ICD-10-CM

## 2017-02-19 DIAGNOSIS — Z7901 Long term (current) use of anticoagulants: Secondary | ICD-10-CM | POA: Insufficient documentation

## 2017-02-19 DIAGNOSIS — I2510 Atherosclerotic heart disease of native coronary artery without angina pectoris: Secondary | ICD-10-CM | POA: Diagnosis not present

## 2017-02-19 DIAGNOSIS — G4733 Obstructive sleep apnea (adult) (pediatric): Secondary | ICD-10-CM | POA: Insufficient documentation

## 2017-02-19 DIAGNOSIS — Z79899 Other long term (current) drug therapy: Secondary | ICD-10-CM | POA: Insufficient documentation

## 2017-02-19 HISTORY — PX: CAPD INSERTION: SHX5233

## 2017-02-19 HISTORY — PX: OTHER SURGICAL HISTORY: SHX169

## 2017-02-19 HISTORY — PX: HERNIA REPAIR: SHX51

## 2017-02-19 HISTORY — PX: EPIGASTRIC HERNIA REPAIR: SHX404

## 2017-02-19 LAB — TYPE AND SCREEN
ABO/RH(D): O POS
ANTIBODY SCREEN: NEGATIVE

## 2017-02-19 LAB — POCT I-STAT 4, (NA,K, GLUC, HGB,HCT)
GLUCOSE: 92 mg/dL (ref 65–99)
HCT: 36 % — ABNORMAL LOW (ref 39.0–52.0)
Hemoglobin: 12.2 g/dL — ABNORMAL LOW (ref 13.0–17.0)
POTASSIUM: 3.7 mmol/L (ref 3.5–5.1)
Sodium: 139 mmol/L (ref 135–145)

## 2017-02-19 LAB — PROTIME-INR
INR: 1.53
Prothrombin Time: 18.3 seconds — ABNORMAL HIGH (ref 11.4–15.2)

## 2017-02-19 SURGERY — REPAIR, HERNIA, EPIGASTRIC, ADULT
Anesthesia: General

## 2017-02-19 MED ORDER — CEFAZOLIN SODIUM-DEXTROSE 2-4 GM/100ML-% IV SOLN
INTRAVENOUS | Status: AC
  Filled 2017-02-19: qty 100

## 2017-02-19 MED ORDER — BUPIVACAINE HCL (PF) 0.5 % IJ SOLN
INTRAMUSCULAR | Status: AC
  Filled 2017-02-19: qty 30

## 2017-02-19 MED ORDER — FENTANYL CITRATE (PF) 100 MCG/2ML IJ SOLN
INTRAMUSCULAR | Status: DC | PRN
  Administered 2017-02-19: 100 ug via INTRAVENOUS

## 2017-02-19 MED ORDER — MIDAZOLAM HCL 2 MG/2ML IJ SOLN
INTRAMUSCULAR | Status: AC
  Filled 2017-02-19: qty 2

## 2017-02-19 MED ORDER — ETOMIDATE 2 MG/ML IV SOLN
INTRAVENOUS | Status: AC
  Filled 2017-02-19: qty 10

## 2017-02-19 MED ORDER — SODIUM CHLORIDE 0.9 % IV SOLN
INTRAVENOUS | Status: DC
  Administered 2017-02-19 (×2): via INTRAVENOUS

## 2017-02-19 MED ORDER — PHENYLEPHRINE HCL 10 MG/ML IJ SOLN
INTRAMUSCULAR | Status: DC | PRN
  Administered 2017-02-19 (×5): 100 ug via INTRAVENOUS

## 2017-02-19 MED ORDER — FENTANYL CITRATE (PF) 100 MCG/2ML IJ SOLN: 25.0000 ug | INTRAMUSCULAR | Status: DC | PRN

## 2017-02-19 MED ORDER — ROCURONIUM BROMIDE 50 MG/5ML IV SOLN
INTRAVENOUS | Status: AC
  Filled 2017-02-19: qty 1

## 2017-02-19 MED ORDER — HYDROCODONE-ACETAMINOPHEN 5-325 MG PO TABS: 1.0000 | ORAL_TABLET | ORAL | 0 refills | Status: DC | PRN

## 2017-02-19 MED ORDER — ETOMIDATE 2 MG/ML IV SOLN
INTRAVENOUS | Status: DC | PRN
  Administered 2017-02-19: 20 mg via INTRAVENOUS

## 2017-02-19 MED ORDER — MIDAZOLAM HCL 2 MG/2ML IJ SOLN
INTRAMUSCULAR | Status: DC | PRN
  Administered 2017-02-19: 2 mg via INTRAVENOUS

## 2017-02-19 MED ORDER — DEXMEDETOMIDINE HCL 200 MCG/2ML IV SOLN
INTRAVENOUS | Status: DC | PRN
  Administered 2017-02-19: 8 ug via INTRAVENOUS

## 2017-02-19 MED ORDER — PROPOFOL 10 MG/ML IV BOLUS
INTRAVENOUS | Status: AC
  Filled 2017-02-19: qty 20

## 2017-02-19 MED ORDER — BUPIVACAINE HCL (PF) 0.5 % IJ SOLN
INTRAMUSCULAR | Status: DC | PRN
  Administered 2017-02-19: 20 mL
  Administered 2017-02-19: 10 mL

## 2017-02-19 MED ORDER — ROCURONIUM BROMIDE 100 MG/10ML IV SOLN
INTRAVENOUS | Status: DC | PRN
  Administered 2017-02-19: 50 mg via INTRAVENOUS

## 2017-02-19 MED ORDER — OXYCODONE HCL 5 MG/5ML PO SOLN: 5.0000 mg | Freq: Once | ORAL | Status: DC | PRN

## 2017-02-19 MED ORDER — NEOSTIGMINE METHYLSULFATE 10 MG/10ML IV SOLN
INTRAVENOUS | Status: DC | PRN
  Administered 2017-02-19: 4 mg via INTRAVENOUS

## 2017-02-19 MED ORDER — OXYCODONE HCL 5 MG PO TABS: 5.0000 mg | ORAL_TABLET | Freq: Once | ORAL | Status: DC | PRN

## 2017-02-19 MED ORDER — ONDANSETRON HCL 4 MG/2ML IJ SOLN
INTRAMUSCULAR | Status: DC | PRN
  Administered 2017-02-19: 4 mg via INTRAVENOUS

## 2017-02-19 MED ORDER — FENTANYL CITRATE (PF) 100 MCG/2ML IJ SOLN
INTRAMUSCULAR | Status: AC
  Filled 2017-02-19: qty 2

## 2017-02-19 MED ORDER — GLYCOPYRROLATE 0.2 MG/ML IJ SOLN
INTRAMUSCULAR | Status: DC | PRN
  Administered 2017-02-19: .6 mg via INTRAVENOUS

## 2017-02-19 MED ORDER — LIDOCAINE HCL (CARDIAC) 20 MG/ML IV SOLN
INTRAVENOUS | Status: DC | PRN
  Administered 2017-02-19: 100 mg via INTRAVENOUS

## 2017-02-19 SURGICAL SUPPLY — 63 items
ADAPTER BETA CAP QUINTON DIALY (ADAPTER) ×3 IMPLANT
ADAPTER CATH DIALYSIS 18.75 (CATHETERS) ×2 IMPLANT
ADAPTER CATH DIALYSIS 18.75CM (CATHETERS) ×1
BLADE SURG 15 STRL SS SAFETY (BLADE) ×3 IMPLANT
CANISTER SUCT 1200ML W/VALVE (MISCELLANEOUS) ×6 IMPLANT
CATH DLYS SWAN NECK 62.5CM (CATHETERS) ×3 IMPLANT
CHLORAPREP W/TINT 26ML (MISCELLANEOUS) ×6 IMPLANT
CLOSURE WOUND 1/2 X4 (GAUZE/BANDAGES/DRESSINGS) ×1
DERMABOND ADVANCED (GAUZE/BANDAGES/DRESSINGS) ×2
DERMABOND ADVANCED .7 DNX12 (GAUZE/BANDAGES/DRESSINGS) ×1 IMPLANT
DRAPE LAPAROTOMY 100X77 ABD (DRAPES) ×3 IMPLANT
DRSG TEGADERM 4X4.75 (GAUZE/BANDAGES/DRESSINGS) ×3 IMPLANT
DRSG TELFA 4X3 1S NADH ST (GAUZE/BANDAGES/DRESSINGS) ×3 IMPLANT
ELECT CAUTERY BLADE 6.4 (BLADE) ×3 IMPLANT
ELECT REM PT RETURN 9FT ADLT (ELECTROSURGICAL) ×6
ELECTRODE REM PT RTRN 9FT ADLT (ELECTROSURGICAL) ×2 IMPLANT
GLOVE BIO SURGEON STRL SZ7 (GLOVE) ×6 IMPLANT
GLOVE BIO SURGEON STRL SZ7.5 (GLOVE) ×3 IMPLANT
GLOVE INDICATOR 7.5 STRL GRN (GLOVE) ×3 IMPLANT
GLOVE INDICATOR 8.0 STRL GRN (GLOVE) ×3 IMPLANT
GOWN STRL REUS W/ TWL LRG LVL3 (GOWN DISPOSABLE) ×4 IMPLANT
GOWN STRL REUS W/ TWL XL LVL3 (GOWN DISPOSABLE) ×1 IMPLANT
GOWN STRL REUS W/TWL LRG LVL3 (GOWN DISPOSABLE) ×8
GOWN STRL REUS W/TWL XL LVL3 (GOWN DISPOSABLE) ×2
IV NS 500ML (IV SOLUTION) ×2
IV NS 500ML BAXH (IV SOLUTION) ×1 IMPLANT
KIT RM TURNOVER STRD PROC AR (KITS) ×3 IMPLANT
LABEL OR SOLS (LABEL) ×6 IMPLANT
MINICAP W/POVIDONE IODINE SOL (MISCELLANEOUS) ×3 IMPLANT
NDL HPO THNWL 1X22GA REG BVL (NEEDLE) ×1 IMPLANT
NDL SAFETY 22GX1.5 (NEEDLE) ×3 IMPLANT
NEEDLE HYPO 22GX1.5 SAFETY (NEEDLE) ×3 IMPLANT
NEEDLE HYPO 25X1 1.5 SAFETY (NEEDLE) ×3 IMPLANT
NEEDLE SAFETY 22GX1 (NEEDLE) ×2
NS IRRIG 500ML POUR BTL (IV SOLUTION) ×3 IMPLANT
PACK BASIN MINOR ARMC (MISCELLANEOUS) ×3 IMPLANT
PACK LAP CHOLECYSTECTOMY (MISCELLANEOUS) ×3 IMPLANT
PENCIL ELECTRO HAND CTR (MISCELLANEOUS) ×3 IMPLANT
SET CYSTO W/LG BORE CLAMP LF (SET/KITS/TRAYS/PACK) ×3 IMPLANT
SET TRANSFER 6 W/TWIST CLAMP 5 (SET/KITS/TRAYS/PACK) ×3 IMPLANT
SPONGE DRAIN TRACH 4X4 STRL 2S (GAUZE/BANDAGES/DRESSINGS) ×3 IMPLANT
SPONGE LAP 18X18 5 PK (GAUZE/BANDAGES/DRESSINGS) ×3 IMPLANT
SPONGE VERSALON 4X4 4PLY (MISCELLANEOUS) ×3 IMPLANT
STAPLER SKIN PROX 35W (STAPLE) ×3 IMPLANT
STRIP CLOSURE SKIN 1/2X4 (GAUZE/BANDAGES/DRESSINGS) ×2 IMPLANT
SUT MNCRL AB 4-0 PS2 18 (SUTURE) ×3 IMPLANT
SUT SURGILON 0 BLK (SUTURE) ×3 IMPLANT
SUT VIC AB 2-0 BRD 54 (SUTURE) ×3 IMPLANT
SUT VIC AB 2-0 CT1 27 (SUTURE) ×2
SUT VIC AB 2-0 CT1 TAPERPNT 27 (SUTURE) ×1 IMPLANT
SUT VIC AB 2-0 UR6 27 (SUTURE) ×3 IMPLANT
SUT VIC AB 3-0 SH 27 (SUTURE) ×2
SUT VIC AB 3-0 SH 27X BRD (SUTURE) ×1 IMPLANT
SUT VIC AB 4-0 FS2 27 (SUTURE) ×6 IMPLANT
SUT VICRYL 0 AB UR-6 (SUTURE) ×3 IMPLANT
SUT VICRYL+ 3-0 144IN (SUTURE) ×3 IMPLANT
SUT VICRYL+ 3-0 36IN CT-1 (SUTURE) ×6 IMPLANT
SWABSTK COMLB BENZOIN TINCTURE (MISCELLANEOUS) ×3 IMPLANT
SYR 3ML LL SCALE MARK (SYRINGE) ×3 IMPLANT
SYR CONTROL 10ML (SYRINGE) ×3 IMPLANT
TROCAR XCEL NON-BLD 11X100MML (ENDOMECHANICALS) ×3 IMPLANT
TROCAR XCEL NON-BLD 5MMX100MML (ENDOMECHANICALS) ×3 IMPLANT
TUBING INSUFFLATOR HI FLOW (MISCELLANEOUS) ×3 IMPLANT

## 2017-02-19 NOTE — Discharge Instructions (Signed)

## 2017-02-19 NOTE — Anesthesia Preprocedure Evaluation (Addendum)
Anesthesia Evaluation  Patient identified by MRN, date of birth, ID band Patient awake    Reviewed: Allergy & Precautions, H&P , NPO status , Patient's Chart, lab work & pertinent test results  History of Anesthesia Complications Negative for: history of anesthetic complications  Airway Mallampati: III  TM Distance: <3 FB Neck ROM: limited    Dental  (+) Chipped, Poor Dentition   Pulmonary neg shortness of breath, sleep apnea ,           Cardiovascular Exercise Tolerance: Good hypertension, (-) angina+ CAD  (-) DOE + dysrhythmias Atrial Fibrillation      Neuro/Psych negative neurological ROS  negative psych ROS   GI/Hepatic negative GI ROS, Neg liver ROS,   Endo/Other  negative endocrine ROS  Renal/GU DialysisRenal disease     Musculoskeletal  (+) Arthritis ,   Abdominal   Peds  Hematology negative hematology ROS (+)   Anesthesia Other Findings Past Medical History: 07/04/2015: Anemia in chronic kidney disease No date: Arthritis 06/2012: Chronic anticoagulation 01 01 2008: Dialysis patient ([facility]) No date: Dysrhythmia     Comment:  A Fib No date: Hx of colonoscopy     Comment:  2009 by Dr. Laural Golden. Normal except for small submucosal               lipoma. No evidence of polyps or colitis. No date: Hyperlipidemia No date: Hypertension 07/25/2015: Hypogonadism in male No date: Kidney disease     Comment:  Chronic kidney disease secondary to IgA nephropathy               diagnosed when he was 39. Kidney transplant in 2000 and               renal cell carcinoma and transplanted kidney removed               along with his own diseased kidneys. No date: OSA (obstructive sleep apnea)     Comment:  No CPAP 08/04/2016: Renal cell carcinoma [facility])  Past Surgical History: 3 /11 /2014: AORTIC VALVE REPLACEMENT     Comment:  At [facility] No date: BACK SURGERY No date: CORONARY ANGIOPLASTY  WITH STENT PLACEMENT No date: DG AV DIALYSIS  SHUNT ACCESS EXIST*L* OR     Comment:  left arm No date: EYE SURGERY     Comment:  bilateral cataract No date: HERNIA REPAIR     Comment:  UMBILICAL HERNIA No date: INNER EAR SURGERY     Comment:  20 years ago No date: KIDNEY TRANSPLANT 07/21/2016: TEE WITHOUT CARDIOVERSION; N/A     Comment:  Procedure: TRANSESOPHAGEAL ECHOCARDIOGRAM (TEE);                Surgeon: Wellington Hampshire, MD;  Location: [facility] ORS;                Service: Cardiovascular;  Laterality: N/A;  BMI    Body Mass Index:  25.52 kg/m      Reproductive/Obstetrics negative OB ROS                            Anesthesia Physical Anesthesia Plan  ASA: III  Anesthesia Plan: General ETT   Post-op Pain Management:    Induction: Intravenous  PONV Risk Score and Plan: 3 and Ondansetron, Dexamethasone and Midazolam  Airway Management Planned: Oral ETT  Additional Equipment:   Intra-op Plan:   Post-operative Plan: Extubation in OR  Informed  Consent: I have reviewed the patients History and Physical, chart, labs and discussed the procedure including the risks, benefits and alternatives for the proposed anesthesia with the patient or authorized representative who has indicated his/her understanding and acceptance.   Dental Advisory Given  Plan Discussed with: Anesthesiologist, CRNA and Surgeon  Anesthesia Plan Comments: (Patient consented for risks of anesthesia including but not limited to:  - adverse reactions to medications - damage to teeth, lips or other oral mucosa - sore throat or hoarseness - Damage to heart, brain, lungs or loss of life  Patient voiced understanding.)        Anesthesia Quick Evaluation

## 2017-02-19 NOTE — H&P (Signed)
 [facility] History & Physical Update  The patient was interviewed and re-examined.  The patient's previous History and Physical has been reviewed and is unchanged.  There is no change in the plan of care. PD catheter to be placed by me with Dr. Bary Castilla performing an epigastric hernia repair as well. We plan to proceed with the scheduled procedure.  Leotis Pain, MD  02/19/2017, 1:50 PM

## 2017-02-19 NOTE — Op Note (Signed)
  OPERATIVE NOTE   PROCEDURE: 1. Laparoscopic peritoneal dialysis catheter placement.  PRE-OPERATIVE DIAGNOSIS: 1. ESRD 2. Ventral hernia  POST-OPERATIVE DIAGNOSIS: Same  SURGEON: Leotis Pain, MD  ASSISTANT(S): None  ANESTHESIA: general  ESTIMATED BLOOD LOSS: 5 cc  FINDING(S): 1. None  SPECIMEN(S): None  INDICATIONS:  Patient presents with ESRD and desires to do PD.  He also has an epigastric hernia that Dr. Bary Castilla will be fixing at the same time as his catheter placement. The patient has decided to do peritoneal dialysis for his long-term dialysis. Risks and benefits of placement were discussed and he is agreeable to proceed.  Differences between peritoneal dialysis and hemodialysis were discussed.    DESCRIPTION: After obtaining full informed written consent, the patient was brought back to the operating room and placed supine upon the operating table. The patient received IV antibiotics prior to induction. After obtaining adequate anesthesia, the abdomen was prepped and draped in the standard fashion. Dr. Bary Castilla made an upper abdominal incision and dissected out the hernia in the epigastric region.  He will be dictating that separately. He then placed the 11 mm trocar through the defect and insufflated the abdomen with carbon dioxide. I then made a small transverse incision just to the left of the umbilicus and we dissected down to the fascia and placed a pursestring Vicryl suture. I then entered the peritoneum just beside the umbilicus with a trocar and the peritoneal dialysis catheter under direct visualization. The coiled portion of the catheter was parked into the pelvis under direct laparoscopic guidance. The deep cuff was secured to the fascial pursestring suture. A small counterincision was made in the left abdomen and the catheter was brought out this site. The appropriate distal connectors were placed, and I then placed 500 cc of saline through the catheter into the  pelvis. The abdomen was desufflated. Immediately, 300 cc of effluent returned through the catheter when the bag was placed to gravity. The 35mm trocar was then removed and Dr. Bary Castilla repaired the epigastric hernia. I then closed my incision with 3-0 Vicryl and 4-0 Monocryl and placed Dermabond as dressing. Dry dressing was placed around the catheter exit site. The patient was then awakened from anesthesia and taken to the recovery room in stable condition having tolerated the procedure well.  COMPLICATIONS: None  CONDITION: None  Leotis Pain, MD 02/19/2017 2:54 PM   This note was created with Dragon Medical transcription system. Any errors in dictation are purely unintentional.

## 2017-02-19 NOTE — Anesthesia Post-op Follow-up Note (Signed)
Anesthesia QCDR form completed.        

## 2017-02-19 NOTE — Anesthesia Procedure Notes (Signed)
Procedure Name: Intubation Date/Time: 02/19/2017 2:19 PM Performed by: Justus Memory, CRNA Pre-anesthesia Checklist: Patient identified, Patient being monitored, Timeout performed, Emergency Drugs available and Suction available Patient Re-evaluated:Patient Re-evaluated prior to induction Oxygen Delivery Method: Circle system utilized Preoxygenation: Pre-oxygenation with 100% oxygen Induction Type: IV induction Ventilation: Mask ventilation without difficulty Laryngoscope Size: Mac and 3 Grade View: Grade I Tube type: Oral Tube size: 7.0 mm Number of attempts: 1 Airway Equipment and Method: Stylet Placement Confirmation: ETT inserted through vocal cords under direct vision,  positive ETCO2 and breath sounds checked- equal and bilateral Secured at: 21 cm Tube secured with: Tape Dental Injury: Teeth and Oropharynx as per pre-operative assessment

## 2017-02-19 NOTE — Op Note (Signed)
Preoperative diagnosis: Epigastric hernia.  End-stage renal disease.  Postoperative diagnosis: Same.  Operative procedure: 1) repair of epigastric hernia; 2) insertion of peritoneal dialysis catheter.  Operative surgeons: Hervey Ard, MD, Corene Cornea do MD.  Anesthesia: General endotracheal, Marcaine 0.5%, plain: 30 cc.  Estimated blood loss: Less than 5 cc.  Clinical note: This 61 year old patient with chronic renal failure, atrial fibrillation and a prosthetic heart valve has developed a symptomatic epigastric hernia.  He is a candidate for placement of a peritoneal dialysis catheter.  A combined procedure was planned.  The patient had clipped hair from his abdomen at home prior to surgery without instruction.  No evidence of skin injury.  The patient had SCD stockings and received Kefzol intravenously prior to the procedure.  After the induction of general endotracheal anesthesia the abdomen was cleansed with ChloraPrep and draped.  The hernia had previously been marked.  A total of 30 cc of Marcaine with out epinephrine was used during the procedure.  A vertical midline incision was made.  The skin was incised sharply and hemostasis was with electrocautery.  The hernia defect was freed circumferentially and the sac discarded.  A pursestring suture of 2-0 Vicryl was placed and a 10 mm port placed.  Pneumoperitoneum was established and Dr. Lucky Cowboy placed the peritoneal dialysis catheter, this procedure will be dictated separately.  After the cannula was removed the fascial defect was closed with interrupted 0 Surgilon simple sutures.  These were all placed and then tied sequentially.  The subcutaneous layer was closed with a running 3-0 Vicryl and the skin closed with a running 4-0 Vicryl septic with suture.  Benzoin and Steri-Strips followed by Telfa and Tegaderm dressing applied.  The patient tolerated the procedure well and was taken to the recovery room in stable condition.

## 2017-02-19 NOTE — OR Nursing (Signed)
Anesthesia and Dr Bary Castilla aware of lab results okay to proceed to OR

## 2017-02-19 NOTE — H&P (Signed)
Patient with epigastric hernia for peritoneal dialysis catheter. No change in clinical health since last exam. For hernia repair and dialysis catheter placement.

## 2017-02-19 NOTE — OR Nursing (Signed)
Dr. Bary Castilla left a message that pt can be dc'd without being seen by himself as long as pt is doing well.

## 2017-02-19 NOTE — Transfer of Care (Signed)
Immediate Anesthesia Transfer of Care Note  Patient: Jeremy [name]  Procedure(s) Performed: HERNIA REPAIR EPIGASTRIC ADULT (N/A ) LAPAROSCOPIC INSERTION CONTINUOUS AMBULATORY PERITONEAL DIALYSIS  (CAPD) CATHETER (N/A )  Patient Location: PACU  Anesthesia Type:General  Level of Consciousness: sedated  Airway & Oxygen Therapy: Patient Spontanous Breathing and Patient connected to face mask oxygen  Post-op Assessment: Report given to RN and Post -op Vital signs reviewed and stable  Post vital signs: Reviewed and stable  Last Vitals:  Vitals:   02/19/17 1256  BP: 116/67  Pulse: 94  Resp: 18  Temp: (!) 36.2 C  SpO2: 100%    Last Pain:  Vitals:   02/19/17 1256  TempSrc: Temporal         Complications: No apparent anesthesia complications

## 2017-02-20 ENCOUNTER — Encounter: Payer: Self-pay | Admitting: General Surgery

## 2017-02-20 DIAGNOSIS — N186 End stage renal disease: Secondary | ICD-10-CM | POA: Diagnosis not present

## 2017-02-20 DIAGNOSIS — N2581 Secondary hyperparathyroidism of renal origin: Secondary | ICD-10-CM | POA: Diagnosis not present

## 2017-02-20 DIAGNOSIS — D631 Anemia in chronic kidney disease: Secondary | ICD-10-CM | POA: Diagnosis not present

## 2017-02-20 NOTE — Progress Notes (Signed)
  I have reviewed the above information and agree with above.   Hayla Hinger, MD 

## 2017-02-20 NOTE — Anesthesia Postprocedure Evaluation (Signed)
Anesthesia Post Note  Patient: Jeremy [name]  Procedure(s) Performed: HERNIA REPAIR EPIGASTRIC ADULT (N/A ) LAPAROSCOPIC INSERTION CONTINUOUS AMBULATORY PERITONEAL DIALYSIS  (CAPD) CATHETER (N/A )  Patient location during evaluation: PACU Anesthesia Type: General Level of consciousness: awake and alert Pain management: pain level controlled Vital Signs Assessment: post-procedure vital signs reviewed and stable Respiratory status: spontaneous breathing, nonlabored ventilation, respiratory function stable and patient connected to nasal cannula oxygen Cardiovascular status: blood pressure returned to baseline and stable Postop Assessment: no apparent nausea or vomiting Anesthetic complications: no     Last Vitals:  Vitals:   02/19/17 1559 02/19/17 1624  BP: (!) 129/113 122/74  Pulse: 86 78  Resp: 17 18  Temp:    SpO2: 98% 100%    Last Pain:  Vitals:   02/19/17 1624  TempSrc:   PainSc: 4                  Joseph K Piscitello

## 2017-02-22 DIAGNOSIS — N2581 Secondary hyperparathyroidism of renal origin: Secondary | ICD-10-CM | POA: Diagnosis not present

## 2017-02-22 DIAGNOSIS — N186 End stage renal disease: Secondary | ICD-10-CM | POA: Diagnosis not present

## 2017-02-22 DIAGNOSIS — D631 Anemia in chronic kidney disease: Secondary | ICD-10-CM | POA: Diagnosis not present

## 2017-02-23 ENCOUNTER — Ambulatory Visit (INDEPENDENT_AMBULATORY_CARE_PROVIDER_SITE_OTHER): Payer: Medicare Other | Admitting: Pharmacist

## 2017-02-23 ENCOUNTER — Telehealth: Payer: Self-pay | Admitting: *Deleted

## 2017-02-23 ENCOUNTER — Telehealth: Payer: Self-pay | Admitting: Pharmacist

## 2017-02-23 DIAGNOSIS — I482 Chronic atrial fibrillation: Secondary | ICD-10-CM | POA: Diagnosis not present

## 2017-02-23 DIAGNOSIS — I4819 Other persistent atrial fibrillation: Secondary | ICD-10-CM

## 2017-02-23 DIAGNOSIS — I481 Persistent atrial fibrillation: Secondary | ICD-10-CM

## 2017-02-23 DIAGNOSIS — Z952 Presence of prosthetic heart valve: Secondary | ICD-10-CM

## 2017-02-23 LAB — POCT INR: INR: 2

## 2017-02-23 NOTE — Telephone Encounter (Signed)
Per Kerin Salen- "Dr. Yong Channel the pharmacist in clinic adjusters patient coumadin for DR. Sonnenberg I was not aware please disregard. (Routing comment) "  Noted- thanks for update

## 2017-02-23 NOTE — Telephone Encounter (Signed)
Called to assess INR, not available. Will call back.

## 2017-02-23 NOTE — Telephone Encounter (Signed)
MD INR calling with out of range INR. 2.0 ,  Coumadin regimen is 2 mg  Per day , has not missed any days but has had surgery for hernia repair 02/19/17 didi not stop coumadin for surgery per Dr. Omelia Blackwater.

## 2017-02-24 DIAGNOSIS — D631 Anemia in chronic kidney disease: Secondary | ICD-10-CM | POA: Diagnosis not present

## 2017-02-24 DIAGNOSIS — N186 End stage renal disease: Secondary | ICD-10-CM | POA: Diagnosis not present

## 2017-02-24 DIAGNOSIS — N2581 Secondary hyperparathyroidism of renal origin: Secondary | ICD-10-CM | POA: Diagnosis not present

## 2017-02-27 DIAGNOSIS — N186 End stage renal disease: Secondary | ICD-10-CM | POA: Diagnosis not present

## 2017-02-27 DIAGNOSIS — N2581 Secondary hyperparathyroidism of renal origin: Secondary | ICD-10-CM | POA: Diagnosis not present

## 2017-02-27 DIAGNOSIS — D631 Anemia in chronic kidney disease: Secondary | ICD-10-CM | POA: Diagnosis not present

## 2017-03-02 ENCOUNTER — Ambulatory Visit (INDEPENDENT_AMBULATORY_CARE_PROVIDER_SITE_OTHER): Payer: Medicare Other | Admitting: Pharmacist

## 2017-03-02 ENCOUNTER — Telehealth: Payer: Self-pay | Admitting: Pharmacist

## 2017-03-02 DIAGNOSIS — N2581 Secondary hyperparathyroidism of renal origin: Secondary | ICD-10-CM | POA: Diagnosis not present

## 2017-03-02 DIAGNOSIS — Z952 Presence of prosthetic heart valve: Secondary | ICD-10-CM

## 2017-03-02 DIAGNOSIS — I481 Persistent atrial fibrillation: Secondary | ICD-10-CM

## 2017-03-02 DIAGNOSIS — D631 Anemia in chronic kidney disease: Secondary | ICD-10-CM | POA: Diagnosis not present

## 2017-03-02 DIAGNOSIS — I2510 Atherosclerotic heart disease of native coronary artery without angina pectoris: Secondary | ICD-10-CM | POA: Diagnosis not present

## 2017-03-02 DIAGNOSIS — I4819 Other persistent atrial fibrillation: Secondary | ICD-10-CM

## 2017-03-02 DIAGNOSIS — N186 End stage renal disease: Secondary | ICD-10-CM | POA: Diagnosis not present

## 2017-03-02 LAB — POCT INR: INR: 2.5

## 2017-03-02 NOTE — Telephone Encounter (Signed)
Called to assess home INR reading. No answer. Left HIPAA compliant VM requesting he call me back.  Carlean Jews, Pharm.D., BCPS PGY2 Ambulatory Care Pharmacy Resident Phone: [phone]

## 2017-03-02 NOTE — Progress Notes (Signed)
  I have reviewed the above information and agree with above.   Lyan Moyano, MD 

## 2017-03-04 ENCOUNTER — Encounter: Payer: Self-pay | Admitting: General Surgery

## 2017-03-04 ENCOUNTER — Ambulatory Visit (INDEPENDENT_AMBULATORY_CARE_PROVIDER_SITE_OTHER): Payer: Medicare Other | Admitting: General Surgery

## 2017-03-04 VITALS — BP 130/72 | HR 89 | Resp 12 | Ht 70.0 in | Wt 187.0 lb

## 2017-03-04 DIAGNOSIS — N2581 Secondary hyperparathyroidism of renal origin: Secondary | ICD-10-CM | POA: Diagnosis not present

## 2017-03-04 DIAGNOSIS — N186 End stage renal disease: Secondary | ICD-10-CM | POA: Diagnosis not present

## 2017-03-04 DIAGNOSIS — K439 Ventral hernia without obstruction or gangrene: Secondary | ICD-10-CM

## 2017-03-04 DIAGNOSIS — D631 Anemia in chronic kidney disease: Secondary | ICD-10-CM | POA: Diagnosis not present

## 2017-03-04 NOTE — Progress Notes (Signed)
Patient ID: Jeremy [name], male   DOB: January 21, 1956, 61 y.o.   MRN: [id number]  Chief Complaint  Patient presents with  . Routine Post Op    HPI Jeremy [name] is a 61 y.o. male.  Here today for postoperative visit, epigastric hernia repair and peritoneal dialysis catheter placed 02-19-17, he states he is doing well. Denies any gastrointestinal issues, bowels are moving regular. Patient had minimal pain after surgery.  Denied any need for narcotics.  Trial infusion through the skin went well.  He reports he has been instructed to keep the site covered for 4 weeks.  HPI  Past Medical History:  Diagnosis Date  . 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 Arthritis   . Chronic anticoagulation 06/2012  . Dialysis patient [facility]) 01 01 2008  . Dysrhythmia    A Fib  . Hx of colonoscopy    2009 by Dr. Laural Golden. Normal except for small submucosal lipoma. No evidence of polyps or colitis.  . Hyperlipidemia   . Hypertension   . Hypogonadism in male 07/25/2015  . 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  Marland Kitchen OSA (obstructive sleep apnea)    No CPAP  . Renal cell carcinoma ([place]) 08/04/2016    Past Surgical History:  Procedure Laterality Date  . AORTIC VALVE REPLACEMENT  3 /11 /2014   At [place]    . CAPD INSERTION N/A 02/19/2017   Procedure: LAPAROSCOPIC INSERTION CONTINUOUS AMBULATORY PERITONEAL DIALYSIS  (CAPD) CATHETER;  Surgeon: Algernon Huxley, MD;  Location: [facility] ORS;  Service: General;  Laterality: N/A;  . CORONARY ANGIOPLASTY WITH STENT PLACEMENT    . DG AV DIALYSIS  SHUNT ACCESS EXIST*L* OR     left arm  . EPIGASTRIC HERNIA REPAIR N/A 02/19/2017   Procedure: HERNIA REPAIR EPIGASTRIC ADULT;  Surgeon: Robert Bellow, MD;  Location: [facility] ORS;  Service: General;   Laterality: N/A;  . EYE SURGERY     bilateral cataract  . HERNIA REPAIR     UMBILICAL HERNIA  . INNER EAR SURGERY     20 years ago  . KIDNEY TRANSPLANT    . TEE WITHOUT CARDIOVERSION N/A 07/21/2016   Procedure: TRANSESOPHAGEAL ECHOCARDIOGRAM (TEE);  Surgeon: Wellington Hampshire, MD;  Location: [facility] ORS;  Service: Cardiovascular;  Laterality: N/A;    Family History  Problem Relation Age of Onset  . Cancer Mother        pancreatic  . Heart disease Father   . Diabetes Father     Social History Social History   Tobacco Use  . Smoking status: Never Smoker  . Smokeless tobacco: Never Used  Substance Use Topics  . Alcohol use: No  . Drug use: No    Allergies  Allergen Reactions  . Statins     Other reaction(s): Arthralgias (intolerance), Myalgias (intolerance)  . Sertraline Rash    Current Outpatient Medications  Medication Sig Dispense Refill  . acetaminophen (TYLENOL) 500 MG tablet Take 1,000 mg 2 (two) times daily as needed by mouth for moderate pain.    Marland Kitchen allopurinol (ZYLOPRIM) 100 MG tablet Take 200 mg by mouth daily.     . Cholecalciferol (VITAMIN D3) 1000 units CAPS Take 2,000 Units every evening by mouth.    . cinacalcet (SENSIPAR) 60 MG tablet Take 60  mg every Monday, Wednesday, and Friday by mouth. At dialysis    . Dextrose-Fructose-Sod Citrate (NAUZENE PO) Take 1-2 tablets 2 (two) times daily as needed by mouth (nausea).    . dicyclomine (BENTYL) 10 MG capsule Take 2 capsules (20 mg total) by mouth 3 (three) times daily before meals. 90 capsule 4  . DiphenhydrAMINE HCl, Sleep, 50 MG CAPS Take 50 mg at bedtime as needed by mouth (sleep).    Marland Kitchen lanthanum (FOSRENOL) 1000 MG chewable tablet Chew 2,000 mg 3 (three) times daily after meals by mouth.     . lidocaine-prilocaine (EMLA) cream Apply 1 application as needed topically (port access).    . nitroGLYCERIN (NITROSTAT) 0.4 MG SL tablet Place 1 tablet (0.4 mg total) under the tongue every 5 (five) minutes as needed for  chest pain. 25 tablet 2  . Omega-3 Fatty Acids (FISH OIL) 1200 MG CAPS Take 1,200 mg 2 (two) times daily by mouth.    Marland Kitchen omeprazole (PRILOSEC) 20 MG capsule Take 20 mg daily by mouth.     Marland Kitchen rOPINIRole (REQUIP) 0.25 MG tablet Take 0.25 mg at bedtime as needed by mouth (restless legs).     . warfarin (COUMADIN) 1 MG tablet Take 3 tablets (3 mg) by mouth on Mondays and Wednesday. Take 2 tablets (2 mg) by mouth all other days. (Patient taking differently: Take 2 mg every evening by mouth. ) 68 tablet 3   No current facility-administered medications for this visit.     Review of Systems Review of Systems  Constitutional: Negative.   Respiratory: Negative.   Cardiovascular: Negative.   Gastrointestinal: Negative for constipation and diarrhea.    Blood pressure 130/72, pulse 89, resp. rate 12, height 5\' 10"  (1.778 m), weight 187 lb (84.8 kg).  Physical Exam Physical Exam  Constitutional: He is oriented to person, place, and time. He appears well-developed and well-nourished.  Abdominal: Soft. There is no tenderness.    Incision clean. PD catheter site clean  Neurological: He is alert and oriented to person, place, and time.  Skin: Skin is warm and dry.  Psychiatric: His behavior is normal.       Assessment    Doing well post epigastric hernia and dialysis catheter placement.    Plan         Proper lifting techniques reviewed. Resume activities as tolerated. Follow up as needed.  HPI, Physical Exam, Assessment and Plan have been scribed under the direction and in the presence of Robert Bellow, MD. Karie Fetch, RN  I have completed the exam and reviewed the above documentation for accuracy and completeness.  I agree with the above.  Haematologist has been used and any errors in dictation or transcription are unintentional.  Hervey Ard, M.D., F.A.C.S.  Robert Bellow 03/04/2017, 12:54 PM

## 2017-03-04 NOTE — Patient Instructions (Signed)
The patient is aware to call back for any questions or concerns.  

## 2017-03-06 DIAGNOSIS — N2581 Secondary hyperparathyroidism of renal origin: Secondary | ICD-10-CM | POA: Diagnosis not present

## 2017-03-06 DIAGNOSIS — T8610 Unspecified complication of kidney transplant: Secondary | ICD-10-CM | POA: Diagnosis not present

## 2017-03-06 DIAGNOSIS — N186 End stage renal disease: Secondary | ICD-10-CM | POA: Diagnosis not present

## 2017-03-06 DIAGNOSIS — D631 Anemia in chronic kidney disease: Secondary | ICD-10-CM | POA: Diagnosis not present

## 2017-03-06 DIAGNOSIS — Z992 Dependence on renal dialysis: Secondary | ICD-10-CM | POA: Diagnosis not present

## 2017-03-09 ENCOUNTER — Ambulatory Visit (INDEPENDENT_AMBULATORY_CARE_PROVIDER_SITE_OTHER): Payer: Medicare Other | Admitting: Pharmacist

## 2017-03-09 ENCOUNTER — Ambulatory Visit (INDEPENDENT_AMBULATORY_CARE_PROVIDER_SITE_OTHER): Payer: Medicare Other | Admitting: Vascular Surgery

## 2017-03-09 DIAGNOSIS — Z7901 Long term (current) use of anticoagulants: Secondary | ICD-10-CM | POA: Diagnosis not present

## 2017-03-09 DIAGNOSIS — N186 End stage renal disease: Secondary | ICD-10-CM | POA: Diagnosis not present

## 2017-03-09 DIAGNOSIS — D631 Anemia in chronic kidney disease: Secondary | ICD-10-CM | POA: Diagnosis not present

## 2017-03-09 DIAGNOSIS — I4819 Other persistent atrial fibrillation: Secondary | ICD-10-CM

## 2017-03-09 DIAGNOSIS — I481 Persistent atrial fibrillation: Secondary | ICD-10-CM

## 2017-03-09 DIAGNOSIS — Z952 Presence of prosthetic heart valve: Secondary | ICD-10-CM | POA: Diagnosis not present

## 2017-03-09 DIAGNOSIS — N2581 Secondary hyperparathyroidism of renal origin: Secondary | ICD-10-CM | POA: Diagnosis not present

## 2017-03-09 LAB — POCT INR: INR: 3.3

## 2017-03-10 ENCOUNTER — Encounter (HOSPITAL_COMMUNITY): Payer: Medicare Other | Attending: Hematology & Oncology

## 2017-03-10 ENCOUNTER — Encounter (INDEPENDENT_AMBULATORY_CARE_PROVIDER_SITE_OTHER): Payer: Self-pay | Admitting: Vascular Surgery

## 2017-03-10 ENCOUNTER — Encounter (HOSPITAL_COMMUNITY): Payer: Self-pay

## 2017-03-10 ENCOUNTER — Other Ambulatory Visit: Payer: Self-pay

## 2017-03-10 ENCOUNTER — Ambulatory Visit (INDEPENDENT_AMBULATORY_CARE_PROVIDER_SITE_OTHER): Payer: Medicare Other | Admitting: Vascular Surgery

## 2017-03-10 VITALS — BP 144/69 | HR 72 | Resp 16 | Wt 188.0 lb

## 2017-03-10 VITALS — BP 152/81 | HR 76 | Temp 98.8°F | Resp 18

## 2017-03-10 DIAGNOSIS — D631 Anemia in chronic kidney disease: Secondary | ICD-10-CM

## 2017-03-10 DIAGNOSIS — Z992 Dependence on renal dialysis: Secondary | ICD-10-CM | POA: Insufficient documentation

## 2017-03-10 DIAGNOSIS — Z940 Kidney transplant status: Secondary | ICD-10-CM | POA: Insufficient documentation

## 2017-03-10 DIAGNOSIS — N183 Chronic kidney disease, stage 3 unspecified: Secondary | ICD-10-CM

## 2017-03-10 DIAGNOSIS — D649 Anemia, unspecified: Secondary | ICD-10-CM | POA: Insufficient documentation

## 2017-03-10 DIAGNOSIS — E291 Testicular hypofunction: Secondary | ICD-10-CM | POA: Diagnosis present

## 2017-03-10 DIAGNOSIS — K439 Ventral hernia without obstruction or gangrene: Secondary | ICD-10-CM

## 2017-03-10 DIAGNOSIS — I129 Hypertensive chronic kidney disease with stage 1 through stage 4 chronic kidney disease, or unspecified chronic kidney disease: Secondary | ICD-10-CM | POA: Insufficient documentation

## 2017-03-10 DIAGNOSIS — E785 Hyperlipidemia, unspecified: Secondary | ICD-10-CM | POA: Insufficient documentation

## 2017-03-10 DIAGNOSIS — N189 Chronic kidney disease, unspecified: Secondary | ICD-10-CM | POA: Insufficient documentation

## 2017-03-10 DIAGNOSIS — G4733 Obstructive sleep apnea (adult) (pediatric): Secondary | ICD-10-CM | POA: Insufficient documentation

## 2017-03-10 DIAGNOSIS — Z79899 Other long term (current) drug therapy: Secondary | ICD-10-CM | POA: Insufficient documentation

## 2017-03-10 DIAGNOSIS — Z7901 Long term (current) use of anticoagulants: Secondary | ICD-10-CM | POA: Insufficient documentation

## 2017-03-10 DIAGNOSIS — Z9889 Other specified postprocedural states: Secondary | ICD-10-CM | POA: Insufficient documentation

## 2017-03-10 DIAGNOSIS — M1990 Unspecified osteoarthritis, unspecified site: Secondary | ICD-10-CM | POA: Insufficient documentation

## 2017-03-10 DIAGNOSIS — N186 End stage renal disease: Secondary | ICD-10-CM

## 2017-03-10 DIAGNOSIS — Z952 Presence of prosthetic heart valve: Secondary | ICD-10-CM | POA: Insufficient documentation

## 2017-03-10 DIAGNOSIS — Z85528 Personal history of other malignant neoplasm of kidney: Secondary | ICD-10-CM | POA: Insufficient documentation

## 2017-03-10 MED ORDER — TESTOSTERONE CYPIONATE 200 MG/ML IM SOLN
200.0000 mg | INTRAMUSCULAR | Status: DC
  Administered 2017-03-10: 200 mg via INTRAMUSCULAR
  Filled 2017-03-10: qty 1

## 2017-03-10 NOTE — Progress Notes (Signed)
Jeremy [name] tolerated Testosterone injection well without complaints or incident. VSS Pt discharged self ambulatory in satisfactory condition

## 2017-03-10 NOTE — Progress Notes (Signed)
Subjective:    Patient ID: Jeremy [name], male    DOB: Aug 23, 1955, 61 y.o.   MRN: [id number] Chief Complaint  Patient presents with  . Routine Post Op    [facility] 2 wk   The patient presents for his first postoperative follow-up.  The patient is status post a ventral hernia repair (by general surgery) and insertion of a peritoneal dialysis catheter on February 19, 2017.  The patient's post operative course has been uneventful.  The patient presents today without complaint.  The patient's peritoneal dialysis catheter has been flushed 3 times without issue.  He denies any fever, nausea, vomiting or change in his bowel habits.    Review of Systems  Constitutional: Negative.   HENT: Negative.   Eyes: Negative.   Respiratory: Negative.   Cardiovascular: Negative.   Gastrointestinal: Negative.   Endocrine: Negative.   Genitourinary: Negative.   Musculoskeletal: Negative.   Skin: Negative.   Allergic/Immunologic: Negative.   Neurological: Negative.   Hematological: Negative.   Psychiatric/Behavioral: Negative.       Objective:   Physical Exam  Constitutional: He is oriented to person, place, and time. He appears well-developed and well-nourished. No distress.  HENT:  Head: Normocephalic and atraumatic.  Eyes: Conjunctivae are normal.  Neck: Normal range of motion.  Cardiovascular: Normal rate, regular rhythm, normal heart sounds and intact distal pulses.  Pulses:      Radial pulses are 2+ on the right side, and 2+ on the left side.       Dorsalis pedis pulses are 2+ on the right side, and 2+ on the left side.       Posterior tibial pulses are 2+ on the right side, and 2+ on the left side.  Pulmonary/Chest: Effort normal and breath sounds normal.  Abdominal: Soft. Bowel sounds are normal. He exhibits no distension. There is no tenderness. There is no rebound.  Incisions are healing well.  Peritoneal dialysis catheter is intact with minimal drainage.  Musculoskeletal: Normal range of  motion. He exhibits no edema.  Neurological: He is alert and oriented to person, place, and time.  Skin: Skin is warm and dry. He is not diaphoretic.  Psychiatric: He has a normal mood and affect. His behavior is normal. Judgment and thought content normal.  Vitals reviewed.  BP (!) 144/69 (BP Location: Right Arm)   Pulse 72   Resp 16   Wt 188 lb (85.3 kg)   BMI 26.98 kg/m   Past Medical History:  Diagnosis Date  . 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 Arthritis   . Chronic anticoagulation 06/2012  . Dialysis patient [facility]) 01 01 2008  . Dysrhythmia    A Fib  . Hx of colonoscopy    2009 by Dr. Laural Golden. Normal except for small submucosal lipoma. No evidence of polyps or colitis.  . Hyperlipidemia   . Hypertension   . Hypogonadism in male 07/25/2015  . 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  Marland Kitchen OSA (obstructive sleep apnea)    No CPAP  . Renal cell carcinoma ([place]) 08/04/2016   Social History   Socioeconomic History  . Marital status: Divorced    Spouse name: Not on file  . Number of children: Not on file  . Years of education: Not on file  . Highest education level: Not on  file  Social Needs  . Financial resource strain: Not on file  . Food insecurity - worry: Not on file  . Food insecurity - inability: Not on file  . Transportation needs - medical: Not on file  . Transportation needs - non-medical: Not on file  Occupational History  . 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 Smoking status: Never Smoker  . Smokeless tobacco: Never Used  Substance and Sexual Activity  . Alcohol use: No  . Drug use: No  . Sexual activity: Not Currently    Partners: Female  Other Topics Concern  . Not on file  Social History Narrative   Single   1 daughter 38    Past Surgical  History:  Procedure Laterality Date  . AORTIC VALVE REPLACEMENT  3 /11 /2014   At [place]    . CAPD INSERTION N/A 02/19/2017   Procedure: LAPAROSCOPIC INSERTION CONTINUOUS AMBULATORY PERITONEAL DIALYSIS  (CAPD) CATHETER;  Surgeon: Algernon Huxley, MD;  Location: [facility] ORS;  Service: General;  Laterality: N/A;  . CORONARY ANGIOPLASTY WITH STENT PLACEMENT    . DG AV DIALYSIS  SHUNT ACCESS EXIST*L* OR     left arm  . EPIGASTRIC HERNIA REPAIR N/A 02/19/2017   Procedure: HERNIA REPAIR EPIGASTRIC ADULT;  Surgeon: Robert Bellow, MD;  Location: [facility] ORS;  Service: General;  Laterality: N/A;  . EYE SURGERY     bilateral cataract  . HERNIA REPAIR     UMBILICAL HERNIA  . INNER EAR SURGERY     20 years ago  . KIDNEY TRANSPLANT    . TEE WITHOUT CARDIOVERSION N/A 07/21/2016   Procedure: TRANSESOPHAGEAL ECHOCARDIOGRAM (TEE);  Surgeon: Wellington Hampshire, MD;  Location: [facility] ORS;  Service: Cardiovascular;  Laterality: N/A;   Family History  Problem Relation Age of Onset  . Cancer Mother        pancreatic  . Heart disease Father   . Diabetes Father    Allergies  Allergen Reactions  . Statins     Other reaction(s): Arthralgias (intolerance), Myalgias (intolerance)  . Sertraline Rash      Assessment & Plan:  The patient presents for his first postoperative follow-up.  The patient is status post a ventral hernia repair (by general surgery) and insertion of a peritoneal dialysis catheter on February 19, 2017.  The patient's post operative course has been uneventful.  The patient presents today without complaint.  The patient's peritoneal dialysis catheter has been flushed 3 times without issue.  He denies any fever, nausea, vomiting or change in his bowel habits.   1. Ventral hernia without obstruction or gangrene - New Patient seems to be healing well postoperatively He has followed up with his general surgeon and reports no issues with his  repair  2. ESRD on dialysis [facility]) s/p peritoneal dialysis catheter insertion - Stable Patient is healing well Physical exam is unremarkable Postoperative course is unremarkable The patient's catheter has been flushed 3 times without issue At this time, the patient does not need to follow-up with [name] unless he starts to experience any issues with his peritoneal dialysis catheter The patient expresses understanding  Current Outpatient Medications on File Prior to Visit  Medication Sig Dispense Refill  . acetaminophen (TYLENOL) 500 MG tablet Take 1,000 mg 2 (two) times daily as needed by mouth for moderate pain.    Marland Kitchen allopurinol (ZYLOPRIM) 100 MG tablet Take 200 mg by mouth daily.     Marland Kitchen  Cholecalciferol (VITAMIN D3) 1000 units CAPS Take 2,000 Units every evening by mouth.    . cinacalcet (SENSIPAR) 60 MG tablet Take 60 mg every Monday, Wednesday, and Friday by mouth. At dialysis    . Dextrose-Fructose-Sod Citrate (NAUZENE PO) Take 1-2 tablets 2 (two) times daily as needed by mouth (nausea).    . dicyclomine (BENTYL) 10 MG capsule Take 2 capsules (20 mg total) by mouth 3 (three) times daily before meals. 90 capsule 4  . DiphenhydrAMINE HCl, Sleep, 50 MG CAPS Take 50 mg at bedtime as needed by mouth (sleep).    Marland Kitchen lanthanum (FOSRENOL) 1000 MG chewable tablet Chew 2,000 mg 3 (three) times daily after meals by mouth.     . lidocaine-prilocaine (EMLA) cream Apply 1 application as needed topically (port access).    . nitroGLYCERIN (NITROSTAT) 0.4 MG SL tablet Place 1 tablet (0.4 mg total) under the tongue every 5 (five) minutes as needed for chest pain. 25 tablet 2  . Omega-3 Fatty Acids (FISH OIL) 1200 MG CAPS Take 1,200 mg 2 (two) times daily by mouth.    Marland Kitchen omeprazole (PRILOSEC) 20 MG capsule Take 20 mg daily by mouth.     Marland Kitchen rOPINIRole (REQUIP) 0.25 MG tablet Take 0.25 mg at bedtime as needed by mouth (restless legs).     . warfarin (COUMADIN) 1 MG tablet Take 3 tablets (3 mg) by mouth on Mondays and  Wednesday. Take 2 tablets (2 mg) by mouth all other days. (Patient taking differently: Take 2 mg every evening by mouth. ) 68 tablet 3   No current facility-administered medications on file prior to visit.    There are no Patient Instructions on file for this visit. No Follow-up on file.  Milley Vining A Gaye Scorza, PA-C

## 2017-03-10 NOTE — Progress Notes (Signed)
Jeremy [name] presents today for injection per the provider's orders.  Testerone administration without incident by Rhett Bannister, RN; see [facility] for injection details.  Patient tolerated procedure well and without incident.  No questions or complaints noted at this time.  Discharged ambulatory.

## 2017-03-11 ENCOUNTER — Telehealth: Payer: Self-pay | Admitting: *Deleted

## 2017-03-11 DIAGNOSIS — N186 End stage renal disease: Secondary | ICD-10-CM | POA: Diagnosis not present

## 2017-03-11 DIAGNOSIS — D631 Anemia in chronic kidney disease: Secondary | ICD-10-CM | POA: Diagnosis not present

## 2017-03-11 DIAGNOSIS — N2581 Secondary hyperparathyroidism of renal origin: Secondary | ICD-10-CM | POA: Diagnosis not present

## 2017-03-11 MED ORDER — EVOLOCUMAB 140 MG/ML ~~LOC~~ SOAJ: 1.0000 "application " | SUBCUTANEOUS | 5 refills | Status: DC

## 2017-03-11 NOTE — Telephone Encounter (Signed)
-----   Message from Leone Haven, MD sent at 02/11/2017  1:04 PM EST ----- I will forward to [facility] to see if we can get him set up with [facility].  Thanks.

## 2017-03-13 DIAGNOSIS — N186 End stage renal disease: Secondary | ICD-10-CM | POA: Diagnosis not present

## 2017-03-13 DIAGNOSIS — N2581 Secondary hyperparathyroidism of renal origin: Secondary | ICD-10-CM | POA: Diagnosis not present

## 2017-03-13 DIAGNOSIS — D631 Anemia in chronic kidney disease: Secondary | ICD-10-CM | POA: Diagnosis not present

## 2017-03-16 ENCOUNTER — Ambulatory Visit: Payer: Self-pay | Admitting: Pharmacist

## 2017-03-16 DIAGNOSIS — I4819 Other persistent atrial fibrillation: Secondary | ICD-10-CM

## 2017-03-16 DIAGNOSIS — Z7901 Long term (current) use of anticoagulants: Secondary | ICD-10-CM

## 2017-03-16 DIAGNOSIS — Z952 Presence of prosthetic heart valve: Secondary | ICD-10-CM

## 2017-03-16 DIAGNOSIS — D631 Anemia in chronic kidney disease: Secondary | ICD-10-CM | POA: Diagnosis not present

## 2017-03-16 DIAGNOSIS — N2581 Secondary hyperparathyroidism of renal origin: Secondary | ICD-10-CM | POA: Diagnosis not present

## 2017-03-16 DIAGNOSIS — N186 End stage renal disease: Secondary | ICD-10-CM | POA: Diagnosis not present

## 2017-03-16 LAB — POCT INR: INR: 4.4

## 2017-03-17 ENCOUNTER — Telehealth: Payer: Self-pay | Admitting: Family Medicine

## 2017-03-17 DIAGNOSIS — K769 Liver disease, unspecified: Secondary | ICD-10-CM | POA: Insufficient documentation

## 2017-03-17 DIAGNOSIS — E7849 Other hyperlipidemia: Secondary | ICD-10-CM | POA: Diagnosis not present

## 2017-03-17 DIAGNOSIS — N2589 Other disorders resulting from impaired renal tubular function: Secondary | ICD-10-CM | POA: Diagnosis not present

## 2017-03-17 DIAGNOSIS — M150 Primary generalized (osteo)arthritis: Secondary | ICD-10-CM | POA: Diagnosis not present

## 2017-03-17 DIAGNOSIS — M65351 Trigger finger, right little finger: Secondary | ICD-10-CM | POA: Diagnosis not present

## 2017-03-17 DIAGNOSIS — R7309 Other abnormal glucose: Secondary | ICD-10-CM | POA: Insufficient documentation

## 2017-03-17 DIAGNOSIS — M19041 Primary osteoarthritis, right hand: Secondary | ICD-10-CM | POA: Diagnosis not present

## 2017-03-17 DIAGNOSIS — M1990 Unspecified osteoarthritis, unspecified site: Secondary | ICD-10-CM | POA: Insufficient documentation

## 2017-03-17 DIAGNOSIS — E875 Hyperkalemia: Secondary | ICD-10-CM | POA: Insufficient documentation

## 2017-03-17 DIAGNOSIS — N186 End stage renal disease: Secondary | ICD-10-CM | POA: Diagnosis not present

## 2017-03-17 DIAGNOSIS — E878 Other disorders of electrolyte and fluid balance, not elsewhere classified: Secondary | ICD-10-CM | POA: Insufficient documentation

## 2017-03-17 DIAGNOSIS — M1A09X1 Idiopathic chronic gout, multiple sites, with tophus (tophi): Secondary | ICD-10-CM | POA: Diagnosis not present

## 2017-03-17 DIAGNOSIS — D509 Iron deficiency anemia, unspecified: Secondary | ICD-10-CM | POA: Diagnosis not present

## 2017-03-17 DIAGNOSIS — D631 Anemia in chronic kidney disease: Secondary | ICD-10-CM | POA: Diagnosis not present

## 2017-03-17 DIAGNOSIS — N2581 Secondary hyperparathyroidism of renal origin: Secondary | ICD-10-CM | POA: Diagnosis not present

## 2017-03-17 DIAGNOSIS — Z992 Dependence on renal dialysis: Secondary | ICD-10-CM | POA: Diagnosis not present

## 2017-03-17 DIAGNOSIS — R17 Unspecified jaundice: Secondary | ICD-10-CM | POA: Insufficient documentation

## 2017-03-17 DIAGNOSIS — M19042 Primary osteoarthritis, left hand: Secondary | ICD-10-CM | POA: Diagnosis not present

## 2017-03-17 NOTE — Telephone Encounter (Signed)
Received a call from [place] from a lab to give results of INR, that was done 03/16/17 was 4.4 per result in chart.

## 2017-03-17 NOTE — Telephone Encounter (Signed)
Pt was called to give order to hold his coumadin for today and go to ED if he starts bleeding. No answer, left  message for him to call back.

## 2017-03-18 ENCOUNTER — Telehealth: Payer: Self-pay | Admitting: Internal Medicine

## 2017-03-18 DIAGNOSIS — N186 End stage renal disease: Secondary | ICD-10-CM | POA: Diagnosis not present

## 2017-03-18 DIAGNOSIS — D509 Iron deficiency anemia, unspecified: Secondary | ICD-10-CM | POA: Diagnosis not present

## 2017-03-18 DIAGNOSIS — K769 Liver disease, unspecified: Secondary | ICD-10-CM | POA: Diagnosis not present

## 2017-03-18 DIAGNOSIS — N2589 Other disorders resulting from impaired renal tubular function: Secondary | ICD-10-CM | POA: Diagnosis not present

## 2017-03-18 DIAGNOSIS — D631 Anemia in chronic kidney disease: Secondary | ICD-10-CM | POA: Diagnosis not present

## 2017-03-18 DIAGNOSIS — N2581 Secondary hyperparathyroidism of renal origin: Secondary | ICD-10-CM | POA: Diagnosis not present

## 2017-03-18 NOTE — Telephone Encounter (Signed)
LMTCB

## 2017-03-18 NOTE — Telephone Encounter (Signed)
FYI

## 2017-03-18 NOTE — Telephone Encounter (Signed)
Noted. Please try to touch base with the patient to see if he held his coumadin. Please also ensure patient has been consistent with his 2 mg daily dosing of his coumadin. Thanks.

## 2017-03-18 NOTE — Telephone Encounter (Signed)
Pt called regarding messages left on his voice mail 03/17/17; the following information was given to the pt per telephone encounter per Dorene Ar;, "Received a call from [place] from a lab to give results of INR, that was done 03/16/17 was 4.4 per result in chart."; and"Pt was called to give order to hold his coumadin for today and go to ED if he starts bleeding. No answer, left  message for him to call back."; pt verbalizes understanding.

## 2017-03-19 ENCOUNTER — Other Ambulatory Visit
Admission: RE | Admit: 2017-03-19 | Discharge: 2017-03-19 | Disposition: A | Discharge status: Home / Self Care | Payer: Medicare Other | Source: Ambulatory Visit | Attending: Nephrology | Admitting: Nephrology

## 2017-03-19 DIAGNOSIS — N2589 Other disorders resulting from impaired renal tubular function: Secondary | ICD-10-CM | POA: Diagnosis not present

## 2017-03-19 DIAGNOSIS — K769 Liver disease, unspecified: Secondary | ICD-10-CM | POA: Diagnosis not present

## 2017-03-19 DIAGNOSIS — D631 Anemia in chronic kidney disease: Secondary | ICD-10-CM | POA: Diagnosis not present

## 2017-03-19 DIAGNOSIS — N186 End stage renal disease: Secondary | ICD-10-CM | POA: Insufficient documentation

## 2017-03-19 DIAGNOSIS — N2581 Secondary hyperparathyroidism of renal origin: Secondary | ICD-10-CM | POA: Diagnosis not present

## 2017-03-19 DIAGNOSIS — D509 Iron deficiency anemia, unspecified: Secondary | ICD-10-CM | POA: Diagnosis not present

## 2017-03-19 LAB — BASIC METABOLIC PANEL
Anion gap: 14 (ref 5–15)
BUN: 40 mg/dL — ABNORMAL HIGH (ref 6–20)
CHLORIDE: 96 mmol/L — AB (ref 101–111)
CO2: 28 mmol/L (ref 22–32)
CREATININE: 11.01 mg/dL — AB (ref 0.61–1.24)
Calcium: 9.1 mg/dL (ref 8.9–10.3)
GFR calc non Af Amer: 4 mL/min — ABNORMAL LOW (ref >60)
GFR, EST AFRICAN AMERICAN: 5 mL/min — AB (ref >60)
Glucose, Bld: 91 mg/dL (ref 65–99)
Potassium: 4.4 mmol/L (ref 3.5–5.1)
Sodium: 138 mmol/L (ref 135–145)

## 2017-03-19 NOTE — Progress Notes (Signed)
I have reviewed the note and agree.  Tommi Rumps, M.D.

## 2017-03-20 DIAGNOSIS — Z23 Encounter for immunization: Secondary | ICD-10-CM | POA: Diagnosis not present

## 2017-03-20 DIAGNOSIS — N186 End stage renal disease: Secondary | ICD-10-CM | POA: Diagnosis not present

## 2017-03-20 DIAGNOSIS — N2581 Secondary hyperparathyroidism of renal origin: Secondary | ICD-10-CM | POA: Diagnosis not present

## 2017-03-23 ENCOUNTER — Ambulatory Visit (INDEPENDENT_AMBULATORY_CARE_PROVIDER_SITE_OTHER): Payer: Medicare Other | Admitting: Pharmacist

## 2017-03-23 DIAGNOSIS — I4819 Other persistent atrial fibrillation: Secondary | ICD-10-CM

## 2017-03-23 DIAGNOSIS — N186 End stage renal disease: Secondary | ICD-10-CM | POA: Diagnosis not present

## 2017-03-23 DIAGNOSIS — Z23 Encounter for immunization: Secondary | ICD-10-CM | POA: Diagnosis not present

## 2017-03-23 DIAGNOSIS — N2581 Secondary hyperparathyroidism of renal origin: Secondary | ICD-10-CM | POA: Diagnosis not present

## 2017-03-23 DIAGNOSIS — Z7901 Long term (current) use of anticoagulants: Secondary | ICD-10-CM | POA: Diagnosis not present

## 2017-03-23 DIAGNOSIS — Z952 Presence of prosthetic heart valve: Secondary | ICD-10-CM | POA: Diagnosis not present

## 2017-03-23 DIAGNOSIS — I481 Persistent atrial fibrillation: Secondary | ICD-10-CM

## 2017-03-23 DIAGNOSIS — I482 Chronic atrial fibrillation: Secondary | ICD-10-CM | POA: Diagnosis not present

## 2017-03-23 LAB — POCT INR: INR: 3.5

## 2017-03-24 DIAGNOSIS — N2581 Secondary hyperparathyroidism of renal origin: Secondary | ICD-10-CM | POA: Diagnosis not present

## 2017-03-24 DIAGNOSIS — N186 End stage renal disease: Secondary | ICD-10-CM | POA: Diagnosis not present

## 2017-03-24 DIAGNOSIS — E8770 Fluid overload, unspecified: Secondary | ICD-10-CM | POA: Diagnosis not present

## 2017-03-25 DIAGNOSIS — N186 End stage renal disease: Secondary | ICD-10-CM | POA: Diagnosis not present

## 2017-03-25 DIAGNOSIS — Z23 Encounter for immunization: Secondary | ICD-10-CM | POA: Diagnosis not present

## 2017-03-25 DIAGNOSIS — N2581 Secondary hyperparathyroidism of renal origin: Secondary | ICD-10-CM | POA: Diagnosis not present

## 2017-03-25 NOTE — Progress Notes (Signed)
I have reviewed the above note and agree. I was available to the pharmacist for consultation.  Jessic Standifer, MD  

## 2017-03-26 DIAGNOSIS — T8610 Unspecified complication of kidney transplant: Secondary | ICD-10-CM | POA: Diagnosis not present

## 2017-03-26 DIAGNOSIS — N186 End stage renal disease: Secondary | ICD-10-CM | POA: Diagnosis not present

## 2017-03-26 DIAGNOSIS — Z992 Dependence on renal dialysis: Secondary | ICD-10-CM | POA: Diagnosis not present

## 2017-03-26 DIAGNOSIS — N2581 Secondary hyperparathyroidism of renal origin: Secondary | ICD-10-CM | POA: Diagnosis not present

## 2017-03-27 DIAGNOSIS — N2581 Secondary hyperparathyroidism of renal origin: Secondary | ICD-10-CM | POA: Diagnosis not present

## 2017-03-27 DIAGNOSIS — N186 End stage renal disease: Secondary | ICD-10-CM | POA: Diagnosis not present

## 2017-03-28 DIAGNOSIS — N2581 Secondary hyperparathyroidism of renal origin: Secondary | ICD-10-CM | POA: Diagnosis not present

## 2017-03-28 DIAGNOSIS — N186 End stage renal disease: Secondary | ICD-10-CM | POA: Diagnosis not present

## 2017-03-29 DIAGNOSIS — N2581 Secondary hyperparathyroidism of renal origin: Secondary | ICD-10-CM | POA: Diagnosis not present

## 2017-03-29 DIAGNOSIS — N186 End stage renal disease: Secondary | ICD-10-CM | POA: Diagnosis not present

## 2017-03-30 ENCOUNTER — Ambulatory Visit (INDEPENDENT_AMBULATORY_CARE_PROVIDER_SITE_OTHER): Payer: Medicare Other | Admitting: Pharmacist

## 2017-03-30 DIAGNOSIS — I481 Persistent atrial fibrillation: Secondary | ICD-10-CM | POA: Diagnosis not present

## 2017-03-30 DIAGNOSIS — Z7901 Long term (current) use of anticoagulants: Secondary | ICD-10-CM

## 2017-03-30 DIAGNOSIS — Z952 Presence of prosthetic heart valve: Secondary | ICD-10-CM

## 2017-03-30 DIAGNOSIS — N186 End stage renal disease: Secondary | ICD-10-CM | POA: Diagnosis not present

## 2017-03-30 DIAGNOSIS — N2581 Secondary hyperparathyroidism of renal origin: Secondary | ICD-10-CM | POA: Diagnosis not present

## 2017-03-30 DIAGNOSIS — I4819 Other persistent atrial fibrillation: Secondary | ICD-10-CM

## 2017-03-30 LAB — POCT INR: INR: 4.6

## 2017-03-31 DIAGNOSIS — N2581 Secondary hyperparathyroidism of renal origin: Secondary | ICD-10-CM | POA: Diagnosis not present

## 2017-03-31 DIAGNOSIS — N186 End stage renal disease: Secondary | ICD-10-CM | POA: Diagnosis not present

## 2017-04-01 ENCOUNTER — Telehealth: Payer: Self-pay

## 2017-04-01 DIAGNOSIS — N186 End stage renal disease: Secondary | ICD-10-CM | POA: Diagnosis not present

## 2017-04-01 DIAGNOSIS — N2581 Secondary hyperparathyroidism of renal origin: Secondary | ICD-10-CM | POA: Diagnosis not present

## 2017-04-01 NOTE — Telephone Encounter (Signed)
Received Critical lab from [facility]  Patients INR was 4.8 as of [id number]. Please advise.

## 2017-04-01 NOTE — Progress Notes (Signed)
I have reviewed the above note and agree.  Allie Ousley, M.D.  

## 2017-04-01 NOTE — Telephone Encounter (Signed)
Noted.  Chrys Racer already spoke with the patient.  See the anticoagulation visit note.

## 2017-04-02 DIAGNOSIS — N186 End stage renal disease: Secondary | ICD-10-CM | POA: Diagnosis not present

## 2017-04-02 DIAGNOSIS — N2581 Secondary hyperparathyroidism of renal origin: Secondary | ICD-10-CM | POA: Diagnosis not present

## 2017-04-03 DIAGNOSIS — N2581 Secondary hyperparathyroidism of renal origin: Secondary | ICD-10-CM | POA: Diagnosis not present

## 2017-04-03 DIAGNOSIS — N186 End stage renal disease: Secondary | ICD-10-CM | POA: Diagnosis not present

## 2017-04-04 DIAGNOSIS — N2581 Secondary hyperparathyroidism of renal origin: Secondary | ICD-10-CM | POA: Diagnosis not present

## 2017-04-04 DIAGNOSIS — N186 End stage renal disease: Secondary | ICD-10-CM | POA: Diagnosis not present

## 2017-04-05 DIAGNOSIS — N186 End stage renal disease: Secondary | ICD-10-CM | POA: Diagnosis not present

## 2017-04-05 DIAGNOSIS — N2581 Secondary hyperparathyroidism of renal origin: Secondary | ICD-10-CM | POA: Diagnosis not present

## 2017-04-06 ENCOUNTER — Ambulatory Visit (INDEPENDENT_AMBULATORY_CARE_PROVIDER_SITE_OTHER): Payer: Medicare Other | Admitting: Pharmacist

## 2017-04-06 DIAGNOSIS — I4819 Other persistent atrial fibrillation: Secondary | ICD-10-CM

## 2017-04-06 DIAGNOSIS — N189 Chronic kidney disease, unspecified: Secondary | ICD-10-CM

## 2017-04-06 DIAGNOSIS — Z7901 Long term (current) use of anticoagulants: Secondary | ICD-10-CM | POA: Diagnosis not present

## 2017-04-06 DIAGNOSIS — D631 Anemia in chronic kidney disease: Secondary | ICD-10-CM | POA: Diagnosis not present

## 2017-04-06 DIAGNOSIS — N2581 Secondary hyperparathyroidism of renal origin: Secondary | ICD-10-CM | POA: Diagnosis not present

## 2017-04-06 DIAGNOSIS — Z952 Presence of prosthetic heart valve: Secondary | ICD-10-CM | POA: Diagnosis not present

## 2017-04-06 DIAGNOSIS — N186 End stage renal disease: Secondary | ICD-10-CM | POA: Diagnosis not present

## 2017-04-06 DIAGNOSIS — T8610 Unspecified complication of kidney transplant: Secondary | ICD-10-CM | POA: Diagnosis not present

## 2017-04-06 DIAGNOSIS — I481 Persistent atrial fibrillation: Secondary | ICD-10-CM | POA: Diagnosis not present

## 2017-04-06 DIAGNOSIS — Z992 Dependence on renal dialysis: Secondary | ICD-10-CM | POA: Diagnosis not present

## 2017-04-06 LAB — POCT INR
INR: 2.9
INR: 2.9

## 2017-04-06 NOTE — Progress Notes (Signed)
I have reviewed the above note and agree.  Kierra Jezewski, M.D.  

## 2017-04-07 DIAGNOSIS — D509 Iron deficiency anemia, unspecified: Secondary | ICD-10-CM | POA: Diagnosis not present

## 2017-04-07 DIAGNOSIS — D631 Anemia in chronic kidney disease: Secondary | ICD-10-CM | POA: Diagnosis not present

## 2017-04-07 DIAGNOSIS — E440 Moderate protein-calorie malnutrition: Secondary | ICD-10-CM | POA: Diagnosis not present

## 2017-04-07 DIAGNOSIS — N2581 Secondary hyperparathyroidism of renal origin: Secondary | ICD-10-CM | POA: Diagnosis not present

## 2017-04-07 DIAGNOSIS — Z23 Encounter for immunization: Secondary | ICD-10-CM | POA: Diagnosis not present

## 2017-04-07 DIAGNOSIS — Z79899 Other long term (current) drug therapy: Secondary | ICD-10-CM | POA: Diagnosis not present

## 2017-04-07 DIAGNOSIS — N186 End stage renal disease: Secondary | ICD-10-CM | POA: Diagnosis not present

## 2017-04-07 DIAGNOSIS — R17 Unspecified jaundice: Secondary | ICD-10-CM | POA: Diagnosis not present

## 2017-04-08 ENCOUNTER — Other Ambulatory Visit: Payer: Self-pay

## 2017-04-08 ENCOUNTER — Inpatient Hospital Stay (HOSPITAL_COMMUNITY): Payer: Medicare Other | Admitting: Hematology and Oncology

## 2017-04-08 ENCOUNTER — Encounter (HOSPITAL_COMMUNITY): Payer: Self-pay

## 2017-04-08 ENCOUNTER — Inpatient Hospital Stay (HOSPITAL_COMMUNITY): Payer: Medicare Other | Attending: Oncology

## 2017-04-08 ENCOUNTER — Ambulatory Visit (HOSPITAL_COMMUNITY): Payer: Medicare Other | Admitting: Adult Health

## 2017-04-08 ENCOUNTER — Inpatient Hospital Stay (HOSPITAL_BASED_OUTPATIENT_CLINIC_OR_DEPARTMENT_OTHER): Payer: Medicare Other

## 2017-04-08 ENCOUNTER — Ambulatory Visit (HOSPITAL_COMMUNITY): Payer: Medicare Other

## 2017-04-08 ENCOUNTER — Other Ambulatory Visit (HOSPITAL_COMMUNITY): Payer: Medicare Other

## 2017-04-08 VITALS — BP 173/65 | HR 49 | Temp 98.5°F | Resp 18

## 2017-04-08 DIAGNOSIS — Z79899 Other long term (current) drug therapy: Secondary | ICD-10-CM | POA: Diagnosis not present

## 2017-04-08 DIAGNOSIS — N186 End stage renal disease: Secondary | ICD-10-CM | POA: Diagnosis not present

## 2017-04-08 DIAGNOSIS — N2581 Secondary hyperparathyroidism of renal origin: Secondary | ICD-10-CM | POA: Diagnosis not present

## 2017-04-08 DIAGNOSIS — Z992 Dependence on renal dialysis: Secondary | ICD-10-CM | POA: Diagnosis not present

## 2017-04-08 DIAGNOSIS — N183 Chronic kidney disease, stage 3 unspecified: Secondary | ICD-10-CM

## 2017-04-08 DIAGNOSIS — E291 Testicular hypofunction: Secondary | ICD-10-CM | POA: Diagnosis not present

## 2017-04-08 DIAGNOSIS — C649 Malignant neoplasm of unspecified kidney, except renal pelvis: Secondary | ICD-10-CM

## 2017-04-08 DIAGNOSIS — D509 Iron deficiency anemia, unspecified: Secondary | ICD-10-CM | POA: Diagnosis not present

## 2017-04-08 DIAGNOSIS — D631 Anemia in chronic kidney disease: Secondary | ICD-10-CM | POA: Diagnosis not present

## 2017-04-08 DIAGNOSIS — Z85528 Personal history of other malignant neoplasm of kidney: Secondary | ICD-10-CM | POA: Diagnosis not present

## 2017-04-08 DIAGNOSIS — E440 Moderate protein-calorie malnutrition: Secondary | ICD-10-CM | POA: Diagnosis not present

## 2017-04-08 LAB — VITAMIN B12: Vitamin B-12: 383 pg/mL (ref 180–914)

## 2017-04-08 LAB — BASIC METABOLIC PANEL
Anion gap: 18 — ABNORMAL HIGH (ref 5–15)
BUN: 77 mg/dL — AB (ref 6–20)
CALCIUM: 9.4 mg/dL (ref 8.9–10.3)
CHLORIDE: 96 mmol/L — AB (ref 101–111)
CO2: 23 mmol/L (ref 22–32)
Creatinine, Ser: 14.31 mg/dL — ABNORMAL HIGH (ref 0.61–1.24)
GFR calc Af Amer: 4 mL/min — ABNORMAL LOW (ref >60)
GFR calc non Af Amer: 3 mL/min — ABNORMAL LOW (ref >60)
Glucose, Bld: 132 mg/dL — ABNORMAL HIGH (ref 65–99)
Potassium: 4.8 mmol/L (ref 3.5–5.1)
Sodium: 137 mmol/L (ref 135–145)

## 2017-04-08 LAB — IRON AND TIBC
Iron: 86 ug/dL (ref 45–182)
Saturation Ratios: 37 % (ref 17.9–39.5)
TIBC: 234 ug/dL — ABNORMAL LOW (ref 250–450)
UIBC: 148 ug/dL

## 2017-04-08 LAB — CBC WITH DIFFERENTIAL/PLATELET
BASOS PCT: 0 %
Basophils Absolute: 0 10*3/uL (ref 0.0–0.1)
EOS ABS: 0.2 10*3/uL (ref 0.0–0.7)
Eosinophils Relative: 3 %
HEMATOCRIT: 33.7 % — AB (ref 39.0–52.0)
HEMOGLOBIN: 10.7 g/dL — AB (ref 13.0–17.0)
LYMPHS ABS: 2.4 10*3/uL (ref 0.7–4.0)
Lymphocytes Relative: 33 %
MCH: 32.7 pg (ref 26.0–34.0)
MCHC: 31.8 g/dL (ref 30.0–36.0)
MCV: 103.1 fL — ABNORMAL HIGH (ref 78.0–100.0)
MONO ABS: 0.3 10*3/uL (ref 0.1–1.0)
MONOS PCT: 4 %
NEUTROS ABS: 4.4 10*3/uL (ref 1.7–7.7)
NEUTROS PCT: 60 %
Platelets: 149 10*3/uL — ABNORMAL LOW (ref 150–400)
RBC: 3.27 MIL/uL — ABNORMAL LOW (ref 4.22–5.81)
RDW: 14.5 % (ref 11.5–15.5)
WBC: 7.4 10*3/uL (ref 4.0–10.5)

## 2017-04-08 LAB — FERRITIN: FERRITIN: 1239 ng/mL — AB (ref 24–336)

## 2017-04-08 LAB — PSA: Prostatic Specific Antigen: 2.45 ng/mL (ref 0.00–4.00)

## 2017-04-08 LAB — FOLATE: FOLATE: 15.1 ng/mL (ref >5.9)

## 2017-04-08 MED ORDER — TESTOSTERONE CYPIONATE 200 MG/ML IM SOLN
200.0000 mg | INTRAMUSCULAR | Status: DC
  Administered 2017-04-08: 200 mg via INTRAMUSCULAR

## 2017-04-08 NOTE — Progress Notes (Signed)
Jeremy [name] presents today for injection per the provider's orders.  Testosterone administration without incident; see MAR for injection details.  Patient tolerated procedure well and without incident.  No questions or complaints noted at this time.  Discharged ambulatory.

## 2017-04-09 ENCOUNTER — Other Ambulatory Visit: Payer: Self-pay | Admitting: Family Medicine

## 2017-04-09 ENCOUNTER — Encounter: Payer: Self-pay | Admitting: Family Medicine

## 2017-04-09 DIAGNOSIS — N2581 Secondary hyperparathyroidism of renal origin: Secondary | ICD-10-CM | POA: Diagnosis not present

## 2017-04-09 DIAGNOSIS — Z79899 Other long term (current) drug therapy: Secondary | ICD-10-CM | POA: Diagnosis not present

## 2017-04-09 DIAGNOSIS — N186 End stage renal disease: Secondary | ICD-10-CM | POA: Diagnosis not present

## 2017-04-09 DIAGNOSIS — D631 Anemia in chronic kidney disease: Secondary | ICD-10-CM | POA: Diagnosis not present

## 2017-04-09 DIAGNOSIS — D509 Iron deficiency anemia, unspecified: Secondary | ICD-10-CM | POA: Diagnosis not present

## 2017-04-09 DIAGNOSIS — E440 Moderate protein-calorie malnutrition: Secondary | ICD-10-CM | POA: Diagnosis not present

## 2017-04-09 LAB — TESTOSTERONE: TESTOSTERONE: 333 ng/dL (ref 264–916)

## 2017-04-09 MED ORDER — NITROGLYCERIN 0.4 MG SL SUBL: 0.4000 mg | SUBLINGUAL_TABLET | SUBLINGUAL | 2 refills | Status: DC | PRN

## 2017-04-10 ENCOUNTER — Other Ambulatory Visit: Payer: Self-pay

## 2017-04-10 DIAGNOSIS — Z79899 Other long term (current) drug therapy: Secondary | ICD-10-CM | POA: Diagnosis not present

## 2017-04-10 DIAGNOSIS — D509 Iron deficiency anemia, unspecified: Secondary | ICD-10-CM | POA: Diagnosis not present

## 2017-04-10 DIAGNOSIS — N186 End stage renal disease: Secondary | ICD-10-CM | POA: Diagnosis not present

## 2017-04-10 DIAGNOSIS — D631 Anemia in chronic kidney disease: Secondary | ICD-10-CM | POA: Diagnosis not present

## 2017-04-10 DIAGNOSIS — E440 Moderate protein-calorie malnutrition: Secondary | ICD-10-CM | POA: Diagnosis not present

## 2017-04-10 DIAGNOSIS — N2581 Secondary hyperparathyroidism of renal origin: Secondary | ICD-10-CM | POA: Diagnosis not present

## 2017-04-10 MED ORDER — WARFARIN SODIUM 1 MG PO TABS: ORAL_TABLET | ORAL | 1 refills | Status: DC

## 2017-04-10 NOTE — Telephone Encounter (Signed)
Pharmacy is requesting 90 days supply Last OV 02/10/17 last filled by DR.Cook 12/29/16 [id number]

## 2017-04-10 NOTE — Telephone Encounter (Signed)
Sent to pharmacy 

## 2017-04-11 DIAGNOSIS — Z79899 Other long term (current) drug therapy: Secondary | ICD-10-CM | POA: Diagnosis not present

## 2017-04-11 DIAGNOSIS — E440 Moderate protein-calorie malnutrition: Secondary | ICD-10-CM | POA: Diagnosis not present

## 2017-04-11 DIAGNOSIS — N186 End stage renal disease: Secondary | ICD-10-CM | POA: Diagnosis not present

## 2017-04-11 DIAGNOSIS — D509 Iron deficiency anemia, unspecified: Secondary | ICD-10-CM | POA: Diagnosis not present

## 2017-04-11 DIAGNOSIS — N2581 Secondary hyperparathyroidism of renal origin: Secondary | ICD-10-CM | POA: Diagnosis not present

## 2017-04-11 DIAGNOSIS — D631 Anemia in chronic kidney disease: Secondary | ICD-10-CM | POA: Diagnosis not present

## 2017-04-12 DIAGNOSIS — D631 Anemia in chronic kidney disease: Secondary | ICD-10-CM | POA: Diagnosis not present

## 2017-04-12 DIAGNOSIS — Z79899 Other long term (current) drug therapy: Secondary | ICD-10-CM | POA: Diagnosis not present

## 2017-04-12 DIAGNOSIS — E440 Moderate protein-calorie malnutrition: Secondary | ICD-10-CM | POA: Diagnosis not present

## 2017-04-12 DIAGNOSIS — D509 Iron deficiency anemia, unspecified: Secondary | ICD-10-CM | POA: Diagnosis not present

## 2017-04-12 DIAGNOSIS — N186 End stage renal disease: Secondary | ICD-10-CM | POA: Diagnosis not present

## 2017-04-12 DIAGNOSIS — N2581 Secondary hyperparathyroidism of renal origin: Secondary | ICD-10-CM | POA: Diagnosis not present

## 2017-04-13 ENCOUNTER — Ambulatory Visit (INDEPENDENT_AMBULATORY_CARE_PROVIDER_SITE_OTHER): Payer: Medicare Other | Admitting: Pharmacist

## 2017-04-13 DIAGNOSIS — Z952 Presence of prosthetic heart valve: Secondary | ICD-10-CM | POA: Diagnosis not present

## 2017-04-13 DIAGNOSIS — I481 Persistent atrial fibrillation: Secondary | ICD-10-CM | POA: Diagnosis not present

## 2017-04-13 DIAGNOSIS — I4819 Other persistent atrial fibrillation: Secondary | ICD-10-CM

## 2017-04-13 DIAGNOSIS — N186 End stage renal disease: Secondary | ICD-10-CM | POA: Diagnosis not present

## 2017-04-13 DIAGNOSIS — Z7901 Long term (current) use of anticoagulants: Secondary | ICD-10-CM | POA: Diagnosis not present

## 2017-04-13 DIAGNOSIS — D631 Anemia in chronic kidney disease: Secondary | ICD-10-CM | POA: Diagnosis not present

## 2017-04-13 DIAGNOSIS — N2581 Secondary hyperparathyroidism of renal origin: Secondary | ICD-10-CM | POA: Diagnosis not present

## 2017-04-13 DIAGNOSIS — E440 Moderate protein-calorie malnutrition: Secondary | ICD-10-CM | POA: Diagnosis not present

## 2017-04-13 DIAGNOSIS — Z79899 Other long term (current) drug therapy: Secondary | ICD-10-CM | POA: Diagnosis not present

## 2017-04-13 DIAGNOSIS — D509 Iron deficiency anemia, unspecified: Secondary | ICD-10-CM | POA: Diagnosis not present

## 2017-04-13 LAB — POCT INR: INR: 4.3

## 2017-04-13 NOTE — Progress Notes (Signed)
I have reviewed the above note and agree. I was available to the pharmacist for consultation.  Shahana Capes, MD  

## 2017-04-14 ENCOUNTER — Encounter: Payer: Self-pay | Admitting: Family Medicine

## 2017-04-14 ENCOUNTER — Telehealth: Payer: Self-pay

## 2017-04-14 DIAGNOSIS — E440 Moderate protein-calorie malnutrition: Secondary | ICD-10-CM | POA: Diagnosis not present

## 2017-04-14 DIAGNOSIS — D631 Anemia in chronic kidney disease: Secondary | ICD-10-CM | POA: Diagnosis not present

## 2017-04-14 DIAGNOSIS — N186 End stage renal disease: Secondary | ICD-10-CM | POA: Diagnosis not present

## 2017-04-14 DIAGNOSIS — D509 Iron deficiency anemia, unspecified: Secondary | ICD-10-CM | POA: Diagnosis not present

## 2017-04-14 DIAGNOSIS — N2581 Secondary hyperparathyroidism of renal origin: Secondary | ICD-10-CM | POA: Diagnosis not present

## 2017-04-14 DIAGNOSIS — Z79899 Other long term (current) drug therapy: Secondary | ICD-10-CM | POA: Diagnosis not present

## 2017-04-14 NOTE — Telephone Encounter (Signed)
[facility] called to report out of range INR for this patient. INR was 4.3 yesterday. Please advise.

## 2017-04-14 NOTE — Telephone Encounter (Signed)
Jeremy [name] spoke with the patient yesterday regarding this. Thanks.

## 2017-04-14 NOTE — Telephone Encounter (Signed)
This encounter was created in error - please disregard.

## 2017-04-15 DIAGNOSIS — D509 Iron deficiency anemia, unspecified: Secondary | ICD-10-CM | POA: Diagnosis not present

## 2017-04-15 DIAGNOSIS — N186 End stage renal disease: Secondary | ICD-10-CM | POA: Diagnosis not present

## 2017-04-15 DIAGNOSIS — E440 Moderate protein-calorie malnutrition: Secondary | ICD-10-CM | POA: Diagnosis not present

## 2017-04-15 DIAGNOSIS — Z79899 Other long term (current) drug therapy: Secondary | ICD-10-CM | POA: Diagnosis not present

## 2017-04-15 DIAGNOSIS — D631 Anemia in chronic kidney disease: Secondary | ICD-10-CM | POA: Diagnosis not present

## 2017-04-15 DIAGNOSIS — N2581 Secondary hyperparathyroidism of renal origin: Secondary | ICD-10-CM | POA: Diagnosis not present

## 2017-04-16 DIAGNOSIS — Z79899 Other long term (current) drug therapy: Secondary | ICD-10-CM | POA: Diagnosis not present

## 2017-04-16 DIAGNOSIS — D631 Anemia in chronic kidney disease: Secondary | ICD-10-CM | POA: Diagnosis not present

## 2017-04-16 DIAGNOSIS — N186 End stage renal disease: Secondary | ICD-10-CM | POA: Diagnosis not present

## 2017-04-16 DIAGNOSIS — D509 Iron deficiency anemia, unspecified: Secondary | ICD-10-CM | POA: Diagnosis not present

## 2017-04-16 DIAGNOSIS — E440 Moderate protein-calorie malnutrition: Secondary | ICD-10-CM | POA: Diagnosis not present

## 2017-04-16 DIAGNOSIS — N2581 Secondary hyperparathyroidism of renal origin: Secondary | ICD-10-CM | POA: Diagnosis not present

## 2017-04-17 DIAGNOSIS — Z79899 Other long term (current) drug therapy: Secondary | ICD-10-CM | POA: Diagnosis not present

## 2017-04-17 DIAGNOSIS — E440 Moderate protein-calorie malnutrition: Secondary | ICD-10-CM | POA: Diagnosis not present

## 2017-04-17 DIAGNOSIS — N186 End stage renal disease: Secondary | ICD-10-CM | POA: Diagnosis not present

## 2017-04-17 DIAGNOSIS — D509 Iron deficiency anemia, unspecified: Secondary | ICD-10-CM | POA: Diagnosis not present

## 2017-04-17 DIAGNOSIS — N2581 Secondary hyperparathyroidism of renal origin: Secondary | ICD-10-CM | POA: Diagnosis not present

## 2017-04-17 DIAGNOSIS — D631 Anemia in chronic kidney disease: Secondary | ICD-10-CM | POA: Diagnosis not present

## 2017-04-18 DIAGNOSIS — D631 Anemia in chronic kidney disease: Secondary | ICD-10-CM | POA: Diagnosis not present

## 2017-04-18 DIAGNOSIS — Z79899 Other long term (current) drug therapy: Secondary | ICD-10-CM | POA: Diagnosis not present

## 2017-04-18 DIAGNOSIS — N2581 Secondary hyperparathyroidism of renal origin: Secondary | ICD-10-CM | POA: Diagnosis not present

## 2017-04-18 DIAGNOSIS — D509 Iron deficiency anemia, unspecified: Secondary | ICD-10-CM | POA: Diagnosis not present

## 2017-04-18 DIAGNOSIS — N186 End stage renal disease: Secondary | ICD-10-CM | POA: Diagnosis not present

## 2017-04-18 DIAGNOSIS — E440 Moderate protein-calorie malnutrition: Secondary | ICD-10-CM | POA: Diagnosis not present

## 2017-04-19 DIAGNOSIS — D631 Anemia in chronic kidney disease: Secondary | ICD-10-CM | POA: Diagnosis not present

## 2017-04-19 DIAGNOSIS — Z79899 Other long term (current) drug therapy: Secondary | ICD-10-CM | POA: Diagnosis not present

## 2017-04-19 DIAGNOSIS — D509 Iron deficiency anemia, unspecified: Secondary | ICD-10-CM | POA: Diagnosis not present

## 2017-04-19 DIAGNOSIS — E440 Moderate protein-calorie malnutrition: Secondary | ICD-10-CM | POA: Diagnosis not present

## 2017-04-19 DIAGNOSIS — N186 End stage renal disease: Secondary | ICD-10-CM | POA: Diagnosis not present

## 2017-04-19 DIAGNOSIS — N2581 Secondary hyperparathyroidism of renal origin: Secondary | ICD-10-CM | POA: Diagnosis not present

## 2017-04-20 ENCOUNTER — Ambulatory Visit (INDEPENDENT_AMBULATORY_CARE_PROVIDER_SITE_OTHER): Payer: Medicare Other | Admitting: Pharmacist

## 2017-04-20 DIAGNOSIS — I481 Persistent atrial fibrillation: Secondary | ICD-10-CM

## 2017-04-20 DIAGNOSIS — I4819 Other persistent atrial fibrillation: Secondary | ICD-10-CM

## 2017-04-20 DIAGNOSIS — D631 Anemia in chronic kidney disease: Secondary | ICD-10-CM | POA: Diagnosis not present

## 2017-04-20 DIAGNOSIS — Z7901 Long term (current) use of anticoagulants: Secondary | ICD-10-CM

## 2017-04-20 DIAGNOSIS — E440 Moderate protein-calorie malnutrition: Secondary | ICD-10-CM | POA: Diagnosis not present

## 2017-04-20 DIAGNOSIS — Z952 Presence of prosthetic heart valve: Secondary | ICD-10-CM | POA: Diagnosis not present

## 2017-04-20 DIAGNOSIS — N186 End stage renal disease: Secondary | ICD-10-CM | POA: Diagnosis not present

## 2017-04-20 DIAGNOSIS — N2581 Secondary hyperparathyroidism of renal origin: Secondary | ICD-10-CM | POA: Diagnosis not present

## 2017-04-20 DIAGNOSIS — Z79899 Other long term (current) drug therapy: Secondary | ICD-10-CM | POA: Diagnosis not present

## 2017-04-20 DIAGNOSIS — I482 Chronic atrial fibrillation: Secondary | ICD-10-CM | POA: Diagnosis not present

## 2017-04-20 DIAGNOSIS — D509 Iron deficiency anemia, unspecified: Secondary | ICD-10-CM | POA: Diagnosis not present

## 2017-04-20 LAB — POCT INR: INR: 4.2

## 2017-04-21 ENCOUNTER — Telehealth: Payer: Self-pay

## 2017-04-21 DIAGNOSIS — N186 End stage renal disease: Secondary | ICD-10-CM | POA: Diagnosis not present

## 2017-04-21 DIAGNOSIS — N2581 Secondary hyperparathyroidism of renal origin: Secondary | ICD-10-CM | POA: Diagnosis not present

## 2017-04-21 DIAGNOSIS — E440 Moderate protein-calorie malnutrition: Secondary | ICD-10-CM | POA: Diagnosis not present

## 2017-04-21 DIAGNOSIS — D631 Anemia in chronic kidney disease: Secondary | ICD-10-CM | POA: Diagnosis not present

## 2017-04-21 DIAGNOSIS — Z79899 Other long term (current) drug therapy: Secondary | ICD-10-CM | POA: Diagnosis not present

## 2017-04-21 DIAGNOSIS — D509 Iron deficiency anemia, unspecified: Secondary | ICD-10-CM | POA: Diagnosis not present

## 2017-04-21 NOTE — Telephone Encounter (Signed)
Noted.  This was dealt with yesterday by Chrys Racer.

## 2017-04-21 NOTE — Telephone Encounter (Signed)
CRITICAL VALUE STICKER  CRITICAL VALUE:4.2 [facility]  RECEIVER (on-site recipient of call):Kristen  DATE & TIME NOTIFIED:   MESSENGER (representative from lab): Denton Ar  MD NOTIFIED: Caryl Bis  TIME OF NOTIFICATION:11:17  RESPONSE:

## 2017-04-22 DIAGNOSIS — D631 Anemia in chronic kidney disease: Secondary | ICD-10-CM | POA: Diagnosis not present

## 2017-04-22 DIAGNOSIS — D509 Iron deficiency anemia, unspecified: Secondary | ICD-10-CM | POA: Diagnosis not present

## 2017-04-22 DIAGNOSIS — Z79899 Other long term (current) drug therapy: Secondary | ICD-10-CM | POA: Diagnosis not present

## 2017-04-22 DIAGNOSIS — E440 Moderate protein-calorie malnutrition: Secondary | ICD-10-CM | POA: Diagnosis not present

## 2017-04-22 DIAGNOSIS — N186 End stage renal disease: Secondary | ICD-10-CM | POA: Diagnosis not present

## 2017-04-22 DIAGNOSIS — N2581 Secondary hyperparathyroidism of renal origin: Secondary | ICD-10-CM | POA: Diagnosis not present

## 2017-04-23 DIAGNOSIS — E440 Moderate protein-calorie malnutrition: Secondary | ICD-10-CM | POA: Diagnosis not present

## 2017-04-23 DIAGNOSIS — D509 Iron deficiency anemia, unspecified: Secondary | ICD-10-CM | POA: Diagnosis not present

## 2017-04-23 DIAGNOSIS — N186 End stage renal disease: Secondary | ICD-10-CM | POA: Diagnosis not present

## 2017-04-23 DIAGNOSIS — D631 Anemia in chronic kidney disease: Secondary | ICD-10-CM | POA: Diagnosis not present

## 2017-04-23 DIAGNOSIS — N2581 Secondary hyperparathyroidism of renal origin: Secondary | ICD-10-CM | POA: Diagnosis not present

## 2017-04-23 DIAGNOSIS — Z79899 Other long term (current) drug therapy: Secondary | ICD-10-CM | POA: Diagnosis not present

## 2017-04-23 NOTE — Progress Notes (Signed)
I have reviewed the above note and agree.  Calem Cocozza, M.D.  

## 2017-04-24 DIAGNOSIS — Z79899 Other long term (current) drug therapy: Secondary | ICD-10-CM | POA: Diagnosis not present

## 2017-04-24 DIAGNOSIS — N186 End stage renal disease: Secondary | ICD-10-CM | POA: Diagnosis not present

## 2017-04-24 DIAGNOSIS — D509 Iron deficiency anemia, unspecified: Secondary | ICD-10-CM | POA: Diagnosis not present

## 2017-04-24 DIAGNOSIS — N2581 Secondary hyperparathyroidism of renal origin: Secondary | ICD-10-CM | POA: Diagnosis not present

## 2017-04-24 DIAGNOSIS — E440 Moderate protein-calorie malnutrition: Secondary | ICD-10-CM | POA: Diagnosis not present

## 2017-04-24 DIAGNOSIS — D631 Anemia in chronic kidney disease: Secondary | ICD-10-CM | POA: Diagnosis not present

## 2017-04-25 DIAGNOSIS — Z79899 Other long term (current) drug therapy: Secondary | ICD-10-CM | POA: Diagnosis not present

## 2017-04-25 DIAGNOSIS — E440 Moderate protein-calorie malnutrition: Secondary | ICD-10-CM | POA: Diagnosis not present

## 2017-04-25 DIAGNOSIS — N186 End stage renal disease: Secondary | ICD-10-CM | POA: Diagnosis not present

## 2017-04-25 DIAGNOSIS — D631 Anemia in chronic kidney disease: Secondary | ICD-10-CM | POA: Diagnosis not present

## 2017-04-25 DIAGNOSIS — N2581 Secondary hyperparathyroidism of renal origin: Secondary | ICD-10-CM | POA: Diagnosis not present

## 2017-04-25 DIAGNOSIS — D509 Iron deficiency anemia, unspecified: Secondary | ICD-10-CM | POA: Diagnosis not present

## 2017-04-26 DIAGNOSIS — D631 Anemia in chronic kidney disease: Secondary | ICD-10-CM | POA: Diagnosis not present

## 2017-04-26 DIAGNOSIS — D509 Iron deficiency anemia, unspecified: Secondary | ICD-10-CM | POA: Diagnosis not present

## 2017-04-26 DIAGNOSIS — E440 Moderate protein-calorie malnutrition: Secondary | ICD-10-CM | POA: Diagnosis not present

## 2017-04-26 DIAGNOSIS — N2581 Secondary hyperparathyroidism of renal origin: Secondary | ICD-10-CM | POA: Diagnosis not present

## 2017-04-26 DIAGNOSIS — Z79899 Other long term (current) drug therapy: Secondary | ICD-10-CM | POA: Diagnosis not present

## 2017-04-26 DIAGNOSIS — N186 End stage renal disease: Secondary | ICD-10-CM | POA: Diagnosis not present

## 2017-04-26 LAB — POCT INR: INR: 2.5

## 2017-04-27 ENCOUNTER — Ambulatory Visit (INDEPENDENT_AMBULATORY_CARE_PROVIDER_SITE_OTHER): Payer: Medicare Other | Admitting: Pharmacist

## 2017-04-27 DIAGNOSIS — Z7901 Long term (current) use of anticoagulants: Secondary | ICD-10-CM

## 2017-04-27 DIAGNOSIS — D631 Anemia in chronic kidney disease: Secondary | ICD-10-CM | POA: Diagnosis not present

## 2017-04-27 DIAGNOSIS — I481 Persistent atrial fibrillation: Secondary | ICD-10-CM | POA: Diagnosis not present

## 2017-04-27 DIAGNOSIS — N186 End stage renal disease: Secondary | ICD-10-CM | POA: Diagnosis not present

## 2017-04-27 DIAGNOSIS — Z952 Presence of prosthetic heart valve: Secondary | ICD-10-CM

## 2017-04-27 DIAGNOSIS — E440 Moderate protein-calorie malnutrition: Secondary | ICD-10-CM | POA: Diagnosis not present

## 2017-04-27 DIAGNOSIS — I4819 Other persistent atrial fibrillation: Secondary | ICD-10-CM

## 2017-04-27 DIAGNOSIS — Z79899 Other long term (current) drug therapy: Secondary | ICD-10-CM | POA: Diagnosis not present

## 2017-04-27 DIAGNOSIS — N2581 Secondary hyperparathyroidism of renal origin: Secondary | ICD-10-CM | POA: Diagnosis not present

## 2017-04-27 DIAGNOSIS — D509 Iron deficiency anemia, unspecified: Secondary | ICD-10-CM | POA: Diagnosis not present

## 2017-04-28 DIAGNOSIS — Z952 Presence of prosthetic heart valve: Secondary | ICD-10-CM | POA: Diagnosis not present

## 2017-04-28 DIAGNOSIS — R0609 Other forms of dyspnea: Secondary | ICD-10-CM | POA: Diagnosis not present

## 2017-04-28 DIAGNOSIS — Z940 Kidney transplant status: Secondary | ICD-10-CM | POA: Diagnosis not present

## 2017-04-28 DIAGNOSIS — I4891 Unspecified atrial fibrillation: Secondary | ICD-10-CM | POA: Diagnosis not present

## 2017-04-28 DIAGNOSIS — D631 Anemia in chronic kidney disease: Secondary | ICD-10-CM | POA: Diagnosis not present

## 2017-04-28 DIAGNOSIS — N186 End stage renal disease: Secondary | ICD-10-CM | POA: Diagnosis not present

## 2017-04-28 DIAGNOSIS — D509 Iron deficiency anemia, unspecified: Secondary | ICD-10-CM | POA: Diagnosis not present

## 2017-04-28 DIAGNOSIS — I350 Nonrheumatic aortic (valve) stenosis: Secondary | ICD-10-CM | POA: Diagnosis not present

## 2017-04-28 DIAGNOSIS — D649 Anemia, unspecified: Secondary | ICD-10-CM | POA: Diagnosis not present

## 2017-04-28 DIAGNOSIS — I481 Persistent atrial fibrillation: Secondary | ICD-10-CM | POA: Diagnosis not present

## 2017-04-28 DIAGNOSIS — I10 Essential (primary) hypertension: Secondary | ICD-10-CM | POA: Diagnosis not present

## 2017-04-28 DIAGNOSIS — N2581 Secondary hyperparathyroidism of renal origin: Secondary | ICD-10-CM | POA: Diagnosis not present

## 2017-04-28 DIAGNOSIS — E440 Moderate protein-calorie malnutrition: Secondary | ICD-10-CM | POA: Diagnosis not present

## 2017-04-28 DIAGNOSIS — Z955 Presence of coronary angioplasty implant and graft: Secondary | ICD-10-CM | POA: Diagnosis not present

## 2017-04-28 DIAGNOSIS — Z79899 Other long term (current) drug therapy: Secondary | ICD-10-CM | POA: Diagnosis not present

## 2017-04-28 DIAGNOSIS — Z7901 Long term (current) use of anticoagulants: Secondary | ICD-10-CM | POA: Diagnosis not present

## 2017-04-29 DIAGNOSIS — E440 Moderate protein-calorie malnutrition: Secondary | ICD-10-CM | POA: Diagnosis not present

## 2017-04-29 DIAGNOSIS — E441 Mild protein-calorie malnutrition: Secondary | ICD-10-CM | POA: Insufficient documentation

## 2017-04-29 DIAGNOSIS — D509 Iron deficiency anemia, unspecified: Secondary | ICD-10-CM | POA: Diagnosis not present

## 2017-04-29 DIAGNOSIS — Z79899 Other long term (current) drug therapy: Secondary | ICD-10-CM | POA: Diagnosis not present

## 2017-04-29 DIAGNOSIS — N186 End stage renal disease: Secondary | ICD-10-CM | POA: Diagnosis not present

## 2017-04-29 DIAGNOSIS — N2581 Secondary hyperparathyroidism of renal origin: Secondary | ICD-10-CM | POA: Diagnosis not present

## 2017-04-29 DIAGNOSIS — D631 Anemia in chronic kidney disease: Secondary | ICD-10-CM | POA: Diagnosis not present

## 2017-04-30 DIAGNOSIS — N2581 Secondary hyperparathyroidism of renal origin: Secondary | ICD-10-CM | POA: Diagnosis not present

## 2017-04-30 DIAGNOSIS — D631 Anemia in chronic kidney disease: Secondary | ICD-10-CM | POA: Diagnosis not present

## 2017-04-30 DIAGNOSIS — N186 End stage renal disease: Secondary | ICD-10-CM | POA: Diagnosis not present

## 2017-04-30 DIAGNOSIS — D509 Iron deficiency anemia, unspecified: Secondary | ICD-10-CM | POA: Diagnosis not present

## 2017-04-30 DIAGNOSIS — E440 Moderate protein-calorie malnutrition: Secondary | ICD-10-CM | POA: Diagnosis not present

## 2017-04-30 DIAGNOSIS — Z79899 Other long term (current) drug therapy: Secondary | ICD-10-CM | POA: Diagnosis not present

## 2017-05-01 DIAGNOSIS — N2581 Secondary hyperparathyroidism of renal origin: Secondary | ICD-10-CM | POA: Diagnosis not present

## 2017-05-01 DIAGNOSIS — Z79899 Other long term (current) drug therapy: Secondary | ICD-10-CM | POA: Diagnosis not present

## 2017-05-01 DIAGNOSIS — D631 Anemia in chronic kidney disease: Secondary | ICD-10-CM | POA: Diagnosis not present

## 2017-05-01 DIAGNOSIS — N186 End stage renal disease: Secondary | ICD-10-CM | POA: Diagnosis not present

## 2017-05-01 DIAGNOSIS — E440 Moderate protein-calorie malnutrition: Secondary | ICD-10-CM | POA: Diagnosis not present

## 2017-05-01 DIAGNOSIS — D509 Iron deficiency anemia, unspecified: Secondary | ICD-10-CM | POA: Diagnosis not present

## 2017-05-02 DIAGNOSIS — Z79899 Other long term (current) drug therapy: Secondary | ICD-10-CM | POA: Diagnosis not present

## 2017-05-02 DIAGNOSIS — N186 End stage renal disease: Secondary | ICD-10-CM | POA: Diagnosis not present

## 2017-05-02 DIAGNOSIS — D509 Iron deficiency anemia, unspecified: Secondary | ICD-10-CM | POA: Diagnosis not present

## 2017-05-02 DIAGNOSIS — E440 Moderate protein-calorie malnutrition: Secondary | ICD-10-CM | POA: Diagnosis not present

## 2017-05-02 DIAGNOSIS — N2581 Secondary hyperparathyroidism of renal origin: Secondary | ICD-10-CM | POA: Diagnosis not present

## 2017-05-02 DIAGNOSIS — D631 Anemia in chronic kidney disease: Secondary | ICD-10-CM | POA: Diagnosis not present

## 2017-05-03 DIAGNOSIS — D509 Iron deficiency anemia, unspecified: Secondary | ICD-10-CM | POA: Diagnosis not present

## 2017-05-03 DIAGNOSIS — D631 Anemia in chronic kidney disease: Secondary | ICD-10-CM | POA: Diagnosis not present

## 2017-05-03 DIAGNOSIS — Z79899 Other long term (current) drug therapy: Secondary | ICD-10-CM | POA: Diagnosis not present

## 2017-05-03 DIAGNOSIS — N186 End stage renal disease: Secondary | ICD-10-CM | POA: Diagnosis not present

## 2017-05-03 DIAGNOSIS — E440 Moderate protein-calorie malnutrition: Secondary | ICD-10-CM | POA: Diagnosis not present

## 2017-05-03 DIAGNOSIS — N2581 Secondary hyperparathyroidism of renal origin: Secondary | ICD-10-CM | POA: Diagnosis not present

## 2017-05-03 NOTE — Progress Notes (Signed)
I have reviewed the above note and agree. I was available to the pharmacist for consultation.  Kristl Morioka, MD  

## 2017-05-04 ENCOUNTER — Ambulatory Visit: Payer: Self-pay | Admitting: Pharmacist

## 2017-05-04 ENCOUNTER — Telehealth: Payer: Self-pay | Admitting: Family Medicine

## 2017-05-04 DIAGNOSIS — Z952 Presence of prosthetic heart valve: Secondary | ICD-10-CM

## 2017-05-04 DIAGNOSIS — N186 End stage renal disease: Secondary | ICD-10-CM | POA: Diagnosis not present

## 2017-05-04 DIAGNOSIS — D509 Iron deficiency anemia, unspecified: Secondary | ICD-10-CM | POA: Diagnosis not present

## 2017-05-04 DIAGNOSIS — N2581 Secondary hyperparathyroidism of renal origin: Secondary | ICD-10-CM | POA: Diagnosis not present

## 2017-05-04 DIAGNOSIS — I4819 Other persistent atrial fibrillation: Secondary | ICD-10-CM

## 2017-05-04 DIAGNOSIS — Z7901 Long term (current) use of anticoagulants: Secondary | ICD-10-CM

## 2017-05-04 DIAGNOSIS — Z79899 Other long term (current) drug therapy: Secondary | ICD-10-CM | POA: Diagnosis not present

## 2017-05-04 DIAGNOSIS — E440 Moderate protein-calorie malnutrition: Secondary | ICD-10-CM | POA: Diagnosis not present

## 2017-05-04 DIAGNOSIS — D631 Anemia in chronic kidney disease: Secondary | ICD-10-CM | POA: Diagnosis not present

## 2017-05-04 LAB — POCT INR: INR: 3.9

## 2017-05-04 NOTE — Telephone Encounter (Signed)
Jeremy [name] aware 

## 2017-05-04 NOTE — Telephone Encounter (Signed)
Jeremy [name] from customer service at [facility] called to report that the pt's INR called to them today at 1219 was 3.9; will route this to [facility] for notification of lab value

## 2017-05-04 NOTE — Telephone Encounter (Signed)
Noted.  See pharmacist note.

## 2017-05-04 NOTE — Progress Notes (Signed)
I have reviewed the above note and agree. I was available to the pharmacist for consultation.  Tiea Manninen, MD  

## 2017-05-04 NOTE — Telephone Encounter (Signed)
Please advise 

## 2017-05-05 ENCOUNTER — Other Ambulatory Visit (HOSPITAL_COMMUNITY): Payer: Self-pay | Admitting: *Deleted

## 2017-05-05 DIAGNOSIS — E440 Moderate protein-calorie malnutrition: Secondary | ICD-10-CM | POA: Diagnosis not present

## 2017-05-05 DIAGNOSIS — N186 End stage renal disease: Secondary | ICD-10-CM | POA: Diagnosis not present

## 2017-05-05 DIAGNOSIS — D631 Anemia in chronic kidney disease: Secondary | ICD-10-CM | POA: Diagnosis not present

## 2017-05-05 DIAGNOSIS — Z79899 Other long term (current) drug therapy: Secondary | ICD-10-CM | POA: Diagnosis not present

## 2017-05-05 DIAGNOSIS — D509 Iron deficiency anemia, unspecified: Secondary | ICD-10-CM | POA: Diagnosis not present

## 2017-05-05 DIAGNOSIS — N2581 Secondary hyperparathyroidism of renal origin: Secondary | ICD-10-CM | POA: Diagnosis not present

## 2017-05-05 DIAGNOSIS — E291 Testicular hypofunction: Secondary | ICD-10-CM

## 2017-05-05 DIAGNOSIS — N189 Chronic kidney disease, unspecified: Secondary | ICD-10-CM

## 2017-05-06 ENCOUNTER — Encounter (HOSPITAL_COMMUNITY): Payer: Self-pay

## 2017-05-06 ENCOUNTER — Other Ambulatory Visit: Payer: Self-pay

## 2017-05-06 ENCOUNTER — Inpatient Hospital Stay (HOSPITAL_COMMUNITY): Payer: Medicare Other | Admitting: Internal Medicine

## 2017-05-06 ENCOUNTER — Inpatient Hospital Stay (HOSPITAL_COMMUNITY): Payer: Medicare Other

## 2017-05-06 ENCOUNTER — Encounter (HOSPITAL_COMMUNITY): Payer: Self-pay | Admitting: Adult Health

## 2017-05-06 DIAGNOSIS — D631 Anemia in chronic kidney disease: Secondary | ICD-10-CM

## 2017-05-06 DIAGNOSIS — D509 Iron deficiency anemia, unspecified: Secondary | ICD-10-CM | POA: Diagnosis not present

## 2017-05-06 DIAGNOSIS — Z992 Dependence on renal dialysis: Secondary | ICD-10-CM | POA: Diagnosis not present

## 2017-05-06 DIAGNOSIS — N186 End stage renal disease: Secondary | ICD-10-CM

## 2017-05-06 DIAGNOSIS — N183 Chronic kidney disease, stage 3 unspecified: Secondary | ICD-10-CM

## 2017-05-06 DIAGNOSIS — E440 Moderate protein-calorie malnutrition: Secondary | ICD-10-CM | POA: Diagnosis not present

## 2017-05-06 DIAGNOSIS — E291 Testicular hypofunction: Secondary | ICD-10-CM

## 2017-05-06 DIAGNOSIS — N189 Chronic kidney disease, unspecified: Secondary | ICD-10-CM

## 2017-05-06 DIAGNOSIS — Z85528 Personal history of other malignant neoplasm of kidney: Secondary | ICD-10-CM | POA: Diagnosis not present

## 2017-05-06 DIAGNOSIS — N2581 Secondary hyperparathyroidism of renal origin: Secondary | ICD-10-CM | POA: Diagnosis not present

## 2017-05-06 DIAGNOSIS — Z79899 Other long term (current) drug therapy: Secondary | ICD-10-CM | POA: Diagnosis not present

## 2017-05-06 LAB — CBC WITH DIFFERENTIAL/PLATELET
BASOS ABS: 0 10*3/uL (ref 0.0–0.1)
BASOS PCT: 0 %
Eosinophils Absolute: 0.2 10*3/uL (ref 0.0–0.7)
Eosinophils Relative: 2 %
HEMATOCRIT: 33 % — AB (ref 39.0–52.0)
HEMOGLOBIN: 10.6 g/dL — AB (ref 13.0–17.0)
Lymphocytes Relative: 38 %
Lymphs Abs: 3.1 10*3/uL (ref 0.7–4.0)
MCH: 32.6 pg (ref 26.0–34.0)
MCHC: 32.1 g/dL (ref 30.0–36.0)
MCV: 101.5 fL — ABNORMAL HIGH (ref 78.0–100.0)
Monocytes Absolute: 0.7 10*3/uL (ref 0.1–1.0)
Monocytes Relative: 8 %
NEUTROS ABS: 4.3 10*3/uL (ref 1.7–7.7)
NEUTROS PCT: 52 %
Platelets: 149 10*3/uL — ABNORMAL LOW (ref 150–400)
RBC: 3.25 MIL/uL — ABNORMAL LOW (ref 4.22–5.81)
RDW: 15.8 % — AB (ref 11.5–15.5)
WBC: 8.3 10*3/uL (ref 4.0–10.5)

## 2017-05-06 LAB — COMPREHENSIVE METABOLIC PANEL
ALBUMIN: 3 g/dL — AB (ref 3.5–5.0)
ALK PHOS: 131 U/L — AB (ref 38–126)
ALT: 15 U/L — ABNORMAL LOW (ref 17–63)
ANION GAP: 20 — AB (ref 5–15)
AST: 17 U/L (ref 15–41)
BILIRUBIN TOTAL: 0.6 mg/dL (ref 0.3–1.2)
BUN: 95 mg/dL — ABNORMAL HIGH (ref 6–20)
CO2: 19 mmol/L — AB (ref 22–32)
Calcium: 8.8 mg/dL — ABNORMAL LOW (ref 8.9–10.3)
Chloride: 96 mmol/L — ABNORMAL LOW (ref 101–111)
Creatinine, Ser: 14.76 mg/dL — ABNORMAL HIGH (ref 0.61–1.24)
GFR calc Af Amer: 4 mL/min — ABNORMAL LOW (ref >60)
GFR calc non Af Amer: 3 mL/min — ABNORMAL LOW (ref >60)
GLUCOSE: 89 mg/dL (ref 65–99)
POTASSIUM: 4.8 mmol/L (ref 3.5–5.1)
Sodium: 135 mmol/L (ref 135–145)
TOTAL PROTEIN: 7.2 g/dL (ref 6.5–8.1)

## 2017-05-06 LAB — PSA: Prostatic Specific Antigen: 3.12 ng/mL (ref 0.00–4.00)

## 2017-05-06 MED ORDER — TESTOSTERONE CYPIONATE 200 MG/ML IM SOLN
200.0000 mg | INTRAMUSCULAR | Status: DC
  Administered 2017-05-06: 200 mg via INTRAMUSCULAR
  Filled 2017-05-06: qty 1

## 2017-05-06 NOTE — Progress Notes (Signed)
Jeremy [name] here today for testosterone injection.  Reviewed his case with Dr. Walden Field, locum medical oncologist. Per Dr. Walden Field' recommendation, she would like the patient to be referred to urology for management of his hypogonadism/low testosterone.  His renal cell carcinoma is monitored by [facility]. His kidney function/hemodialysis and anemia is managed by nephrology.   I briefly spent some time with Jeremy [name] today explaining our recommendations including referral to urology for management of his hypogonadism going forward.    I shared with him that our preference would be to have urology evaluate him and see if he may be eligible for different therapy for his hypogonadism vs continuing current therapy under their management.   Since his h/o kidney cancer and his anemia are being managed by outside providers, we recommend releasing him from follow-up here at our cancer center as we feel a different specialist (urologist or endocrinologist) would be the best folks to manage his low testosterone, instead of [name] managing that going forward. He voices understanding of this rationale and agrees with it.  Will give testosterone injection today as scheduled.  He is scheduled to see his PCP in [place] late next week. He prefers to keep as many of his providers in [place] as possible, which is certainly reasonable.  He lives in [place] now, so it makes sense that he would like his providers closer to home; we support those wishes.    For now, we will not schedule any further appointments here at the cancer center.  He will ask his PCP for referral to urology, as his PCP is more likely to know the specialists in that area.  If Jeremy [name] is not pleased with the referral his PCP makes, or if his PCP is not familiar with any urologists in his area, then we are happy to help make a referral to our local [facility] group. He agrees with this plan.  Encouraged him to reach out to [name] with any needs in the future  and we are happy to see him again if needed.    The patient is agreeable to above plan, as recommended by Dr. Walden Field.     Mike Craze, NP [place] [phone]

## 2017-05-06 NOTE — Progress Notes (Signed)
Jeremy [name] presents today for injection per MD orders. Testosterone 200 mg administered IM in \\[id number] \\[id number]\ Administration without incident. Patient tolerated well.   Treatment given per orders. Patient tolerated it well without problems. Vitals stable and discharged home from clinic ambulatory. Patient is going to see his PCP on Friday, February the 1st,  and ask for a urology referral for someone to take over his testosterone injections.    Patient will call us if he needs help with anything. Mike Craze NP spoke with patient today.

## 2017-05-06 NOTE — Patient Instructions (Signed)
[place] at [facility] Discharge Instructions  RECOMMENDATIONS MADE BY THE CONSULTANT AND ANY TEST RESULTS WILL BE SENT TO YOUR REFERRING PHYSICIAN.  Testosterone injection given today.  Spoke with Mike Craze NP today.   Thank you for choosing [place] at [facility] to provide your oncology and hematology care.  To afford each patient quality time with our provider, please arrive at least 15 minutes before your scheduled appointment time.    If you have a lab appointment with the [facility] please come in thru the  Main Entrance and check in at the main information desk  You need to re-schedule your appointment should you arrive 10 or more minutes late.  We strive to give you quality time with our providers, and arriving late affects you and other patients whose appointments are after yours.  Also, if you no show three or more times for appointments you may be dismissed from the clinic at the providers discretion.     Again, thank you for choosing [facility].  Our hope is that these requests will decrease the amount of time that you wait before being seen by our physicians.       _____________________________________________________________  Should you have questions after your visit to [facility], please contact our office at [phone] between the hours of 8:30 a.m. and 4:30 p.m.  Voicemails left after 4:30 p.m. will not be returned until the following business day.  For prescription refill requests, have your pharmacy contact our office.       Resources For Cancer Patients and their Caregivers ? American Cancer Society: Can assist with transportation, wigs, general needs, runs Look Good Feel Better.        8504178300 ? Cancer Care: Provides financial assistance, online support groups, medication/co-pay assistance.  1-800-813-HOPE 6311794631) ? [place] Assists  [place] cancer patients and their families through emotional , educational and financial support.  754 234 5627 ? [place] DSS Where to apply for food stamps, Medicaid and utility assistance. (878)176-5334 ? [employer]: Transportation to medical appointments. 417-723-4731 ? Social Security Administration: May apply for disability if have a Stage IV cancer. (512)070-1108 205-820-5696 ? [employer], Disability and Transit Services: Assists with nutrition, care and transit needs. [place] Support Programs: @10RELATIVEDAYS @ > Cancer Support Group  2nd Tuesday of the month 1pm-2pm, Journey Room  > Creative Journey  3rd Tuesday of the month 1130am-1pm, Journey Room  > Look Good Feel Better  1st Wednesday of the month 10am-12 noon, Journey Room (Call [place] to register [phone])

## 2017-05-07 DIAGNOSIS — D509 Iron deficiency anemia, unspecified: Secondary | ICD-10-CM | POA: Diagnosis not present

## 2017-05-07 DIAGNOSIS — N2581 Secondary hyperparathyroidism of renal origin: Secondary | ICD-10-CM | POA: Diagnosis not present

## 2017-05-07 DIAGNOSIS — N186 End stage renal disease: Secondary | ICD-10-CM | POA: Diagnosis not present

## 2017-05-07 DIAGNOSIS — E440 Moderate protein-calorie malnutrition: Secondary | ICD-10-CM | POA: Diagnosis not present

## 2017-05-07 DIAGNOSIS — T8610 Unspecified complication of kidney transplant: Secondary | ICD-10-CM | POA: Diagnosis not present

## 2017-05-07 DIAGNOSIS — Z79899 Other long term (current) drug therapy: Secondary | ICD-10-CM | POA: Diagnosis not present

## 2017-05-07 DIAGNOSIS — D631 Anemia in chronic kidney disease: Secondary | ICD-10-CM | POA: Diagnosis not present

## 2017-05-07 DIAGNOSIS — Z992 Dependence on renal dialysis: Secondary | ICD-10-CM | POA: Diagnosis not present

## 2017-05-07 LAB — TESTOSTERONE: TESTOSTERONE: 200 ng/dL — AB (ref 264–916)

## 2017-05-08 DIAGNOSIS — R17 Unspecified jaundice: Secondary | ICD-10-CM | POA: Diagnosis not present

## 2017-05-08 DIAGNOSIS — M1040 Other secondary gout, unspecified site: Secondary | ICD-10-CM | POA: Diagnosis not present

## 2017-05-08 DIAGNOSIS — N2581 Secondary hyperparathyroidism of renal origin: Secondary | ICD-10-CM | POA: Diagnosis not present

## 2017-05-08 DIAGNOSIS — N186 End stage renal disease: Secondary | ICD-10-CM | POA: Diagnosis not present

## 2017-05-08 DIAGNOSIS — E440 Moderate protein-calorie malnutrition: Secondary | ICD-10-CM | POA: Diagnosis not present

## 2017-05-08 DIAGNOSIS — D509 Iron deficiency anemia, unspecified: Secondary | ICD-10-CM | POA: Diagnosis not present

## 2017-05-08 DIAGNOSIS — Z79899 Other long term (current) drug therapy: Secondary | ICD-10-CM | POA: Diagnosis not present

## 2017-05-08 DIAGNOSIS — D631 Anemia in chronic kidney disease: Secondary | ICD-10-CM | POA: Diagnosis not present

## 2017-05-09 DIAGNOSIS — N186 End stage renal disease: Secondary | ICD-10-CM | POA: Diagnosis not present

## 2017-05-09 DIAGNOSIS — D631 Anemia in chronic kidney disease: Secondary | ICD-10-CM | POA: Diagnosis not present

## 2017-05-09 DIAGNOSIS — D509 Iron deficiency anemia, unspecified: Secondary | ICD-10-CM | POA: Diagnosis not present

## 2017-05-09 DIAGNOSIS — Z79899 Other long term (current) drug therapy: Secondary | ICD-10-CM | POA: Diagnosis not present

## 2017-05-09 DIAGNOSIS — E440 Moderate protein-calorie malnutrition: Secondary | ICD-10-CM | POA: Diagnosis not present

## 2017-05-09 DIAGNOSIS — N2581 Secondary hyperparathyroidism of renal origin: Secondary | ICD-10-CM | POA: Diagnosis not present

## 2017-05-10 ENCOUNTER — Other Ambulatory Visit: Payer: Self-pay | Admitting: Family Medicine

## 2017-05-10 DIAGNOSIS — Z79899 Other long term (current) drug therapy: Secondary | ICD-10-CM | POA: Diagnosis not present

## 2017-05-10 DIAGNOSIS — D631 Anemia in chronic kidney disease: Secondary | ICD-10-CM | POA: Diagnosis not present

## 2017-05-10 DIAGNOSIS — E291 Testicular hypofunction: Secondary | ICD-10-CM

## 2017-05-10 DIAGNOSIS — N186 End stage renal disease: Secondary | ICD-10-CM | POA: Diagnosis not present

## 2017-05-10 DIAGNOSIS — N2581 Secondary hyperparathyroidism of renal origin: Secondary | ICD-10-CM | POA: Diagnosis not present

## 2017-05-10 DIAGNOSIS — E440 Moderate protein-calorie malnutrition: Secondary | ICD-10-CM | POA: Diagnosis not present

## 2017-05-10 DIAGNOSIS — D509 Iron deficiency anemia, unspecified: Secondary | ICD-10-CM | POA: Diagnosis not present

## 2017-05-10 NOTE — Progress Notes (Signed)
Please let the patient know that I referred him to urology as requested by his hematologist. They sent me his recent note and recommended seeing urology.

## 2017-05-11 ENCOUNTER — Telehealth: Payer: Self-pay | Admitting: Pharmacist

## 2017-05-11 ENCOUNTER — Ambulatory Visit (INDEPENDENT_AMBULATORY_CARE_PROVIDER_SITE_OTHER): Payer: Medicare Other | Admitting: Pharmacist

## 2017-05-11 DIAGNOSIS — D631 Anemia in chronic kidney disease: Secondary | ICD-10-CM | POA: Diagnosis not present

## 2017-05-11 DIAGNOSIS — Z79899 Other long term (current) drug therapy: Secondary | ICD-10-CM | POA: Diagnosis not present

## 2017-05-11 DIAGNOSIS — Z952 Presence of prosthetic heart valve: Secondary | ICD-10-CM

## 2017-05-11 DIAGNOSIS — E440 Moderate protein-calorie malnutrition: Secondary | ICD-10-CM | POA: Diagnosis not present

## 2017-05-11 DIAGNOSIS — N2581 Secondary hyperparathyroidism of renal origin: Secondary | ICD-10-CM | POA: Diagnosis not present

## 2017-05-11 DIAGNOSIS — Z7901 Long term (current) use of anticoagulants: Secondary | ICD-10-CM | POA: Diagnosis not present

## 2017-05-11 DIAGNOSIS — D509 Iron deficiency anemia, unspecified: Secondary | ICD-10-CM | POA: Diagnosis not present

## 2017-05-11 DIAGNOSIS — I481 Persistent atrial fibrillation: Secondary | ICD-10-CM | POA: Diagnosis not present

## 2017-05-11 DIAGNOSIS — I4819 Other persistent atrial fibrillation: Secondary | ICD-10-CM

## 2017-05-11 DIAGNOSIS — N186 End stage renal disease: Secondary | ICD-10-CM | POA: Diagnosis not present

## 2017-05-11 LAB — POCT INR: INR: 6.3

## 2017-05-11 NOTE — Progress Notes (Signed)
Please inform patient

## 2017-05-11 NOTE — Telephone Encounter (Signed)
Called to assess INR. See accompanying encounter

## 2017-05-11 NOTE — Telephone Encounter (Signed)
Jeremy [name], from [facility] calling to report INR 6.3. Results have been faxed to the office as well. See previous encounters for 2/4 with [name].

## 2017-05-11 NOTE — Progress Notes (Signed)
Also informed patient that he has a urology consult placed and that he should expect a call in the next few days from the urologist's office.

## 2017-05-12 DIAGNOSIS — Z79899 Other long term (current) drug therapy: Secondary | ICD-10-CM | POA: Diagnosis not present

## 2017-05-12 DIAGNOSIS — E440 Moderate protein-calorie malnutrition: Secondary | ICD-10-CM | POA: Diagnosis not present

## 2017-05-12 DIAGNOSIS — D509 Iron deficiency anemia, unspecified: Secondary | ICD-10-CM | POA: Diagnosis not present

## 2017-05-12 DIAGNOSIS — D631 Anemia in chronic kidney disease: Secondary | ICD-10-CM | POA: Diagnosis not present

## 2017-05-12 DIAGNOSIS — N2581 Secondary hyperparathyroidism of renal origin: Secondary | ICD-10-CM | POA: Diagnosis not present

## 2017-05-12 DIAGNOSIS — N186 End stage renal disease: Secondary | ICD-10-CM | POA: Diagnosis not present

## 2017-05-12 NOTE — Progress Notes (Signed)
I have reviewed the above note and agree. I was available to the pharmacist for consultation.  Bubber Rothert, MD  

## 2017-05-12 NOTE — Telephone Encounter (Signed)
Jeremy [name] spoke with patient on 05/11/17

## 2017-05-13 DIAGNOSIS — N186 End stage renal disease: Secondary | ICD-10-CM | POA: Diagnosis not present

## 2017-05-13 DIAGNOSIS — D631 Anemia in chronic kidney disease: Secondary | ICD-10-CM | POA: Diagnosis not present

## 2017-05-13 DIAGNOSIS — N2581 Secondary hyperparathyroidism of renal origin: Secondary | ICD-10-CM | POA: Diagnosis not present

## 2017-05-13 DIAGNOSIS — Z79899 Other long term (current) drug therapy: Secondary | ICD-10-CM | POA: Diagnosis not present

## 2017-05-13 DIAGNOSIS — D509 Iron deficiency anemia, unspecified: Secondary | ICD-10-CM | POA: Diagnosis not present

## 2017-05-13 DIAGNOSIS — E440 Moderate protein-calorie malnutrition: Secondary | ICD-10-CM | POA: Diagnosis not present

## 2017-05-14 ENCOUNTER — Ambulatory Visit (INDEPENDENT_AMBULATORY_CARE_PROVIDER_SITE_OTHER): Payer: Medicare Other | Admitting: Family Medicine

## 2017-05-14 ENCOUNTER — Encounter: Payer: Self-pay | Admitting: Family Medicine

## 2017-05-14 ENCOUNTER — Telehealth: Payer: Self-pay

## 2017-05-14 ENCOUNTER — Ambulatory Visit (INDEPENDENT_AMBULATORY_CARE_PROVIDER_SITE_OTHER): Payer: Medicare Other

## 2017-05-14 VITALS — BP 148/60 | HR 76 | Temp 98.5°F | Wt 189.8 lb

## 2017-05-14 DIAGNOSIS — R21 Rash and other nonspecific skin eruption: Secondary | ICD-10-CM

## 2017-05-14 DIAGNOSIS — M25561 Pain in right knee: Secondary | ICD-10-CM

## 2017-05-14 DIAGNOSIS — N2581 Secondary hyperparathyroidism of renal origin: Secondary | ICD-10-CM | POA: Diagnosis not present

## 2017-05-14 DIAGNOSIS — F322 Major depressive disorder, single episode, severe without psychotic features: Secondary | ICD-10-CM | POA: Diagnosis not present

## 2017-05-14 DIAGNOSIS — I770 Arteriovenous fistula, acquired: Secondary | ICD-10-CM

## 2017-05-14 DIAGNOSIS — G8929 Other chronic pain: Secondary | ICD-10-CM

## 2017-05-14 DIAGNOSIS — M25562 Pain in left knee: Secondary | ICD-10-CM

## 2017-05-14 DIAGNOSIS — M2550 Pain in unspecified joint: Secondary | ICD-10-CM

## 2017-05-14 DIAGNOSIS — M179 Osteoarthritis of knee, unspecified: Secondary | ICD-10-CM | POA: Diagnosis not present

## 2017-05-14 DIAGNOSIS — D631 Anemia in chronic kidney disease: Secondary | ICD-10-CM | POA: Diagnosis not present

## 2017-05-14 DIAGNOSIS — E291 Testicular hypofunction: Secondary | ICD-10-CM | POA: Diagnosis not present

## 2017-05-14 DIAGNOSIS — I4819 Other persistent atrial fibrillation: Secondary | ICD-10-CM

## 2017-05-14 DIAGNOSIS — M109 Gout, unspecified: Secondary | ICD-10-CM | POA: Diagnosis not present

## 2017-05-14 DIAGNOSIS — I481 Persistent atrial fibrillation: Secondary | ICD-10-CM

## 2017-05-14 DIAGNOSIS — D509 Iron deficiency anemia, unspecified: Secondary | ICD-10-CM | POA: Diagnosis not present

## 2017-05-14 DIAGNOSIS — E440 Moderate protein-calorie malnutrition: Secondary | ICD-10-CM | POA: Diagnosis not present

## 2017-05-14 DIAGNOSIS — N186 End stage renal disease: Secondary | ICD-10-CM | POA: Diagnosis not present

## 2017-05-14 DIAGNOSIS — Z79899 Other long term (current) drug therapy: Secondary | ICD-10-CM | POA: Diagnosis not present

## 2017-05-14 NOTE — Progress Notes (Signed)
Tommi Rumps, MD Phone: [phone]  Jeremy [name] is a 62 y.o. male who presents today for follow-up.  Notes over the last month or so he has had intermittent rash over his back.  It itches and he scratches it.  He notes he did change soap though he stopped the soap he changed to.  No new medications.  He did start peritoneal dialysis though this was almost 2 months ago.  He saw nephrology a month ago and did not have the rash at that time.  Patient notes chronic joint aches in his hips and knees.  Knees are getting gradually worse.  Notes it hurts when he walks or when he has to take a step up or down.  No injuries.  He has seen rheumatology in the past for his arthralgias.  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  He was on prednisone previously and this was beneficial though was also for gout.  Patient reports over the last 1-2 days there has been a slight erythematous area over the distal portion of his left forearm fistula.  He notes no fevers.  He does not feel ill.  He does not use the fistula for hemodialysis anymore.  He follows with Dr. Lucky Cowboy.  Patient does report over the last month or so he has been feeling quite depressed.  In the past he has been on Zoloft though this gave him a rash.  He notes no suicidal plan or intent though does have thoughts that he would be better off being dead given all of his chronic medical issues.  He has been getting testosterone supplements through the cancer center at [facility].  He has been referred to urology for further management of this.  Patient's INR has been elevated recently.  He thinks it was related to him using hemp oil for his arthritic pain and thinks that may have thrown it off.  He has had no bleeding.  Social History   Tobacco Use  Smoking Status Never Smoker  Smokeless Tobacco Never Used     ROS see history of present illness  Objective  Physical Exam Vitals:   05/14/17 0802  BP: (!) 148/60  Pulse: 76    Temp: 98.5 F (36.9 C)  SpO2: 99%    BP Readings from Last 3 Encounters:  05/14/17 (!) 148/60  04/08/17 (!) 173/65  03/10/17 (!) 152/81   Wt Readings from Last 3 Encounters:  05/14/17 189 lb 12.8 oz (86.1 kg)  03/10/17 188 lb (85.3 kg)  03/04/17 187 lb (84.8 kg)    Physical Exam  Constitutional: No distress.  Cardiovascular: Normal rate and normal heart sounds. An irregularly irregular rhythm present.  Pulmonary/Chest: Effort normal and breath sounds normal.  Musculoskeletal:  Left forearm fistula noted to have slight overlying erythema and tenderness over the distal aspect with no induration, there is a good thrill and bruit  Neurological: He is alert. Gait normal.  Skin: Skin is warm and dry. He is not diaphoretic.  Scattered excoriated papules on back, no surrounding erythema, no tenderness, no rash on abdomen     Assessment/Plan: Please see individual problem list.  Hypogonadism in male Urology referral previously placed.  Atrial fibrillation ([place]) [I48.91] INR has been elevated.  Advised to follow the clinical pharmacist directions on his Coumadin.  He will not use the hemp oil anymore.  Depression, major, single episode, severe ([place]) Patient with pretty severe depression.  Discussed starting on medication for this.  Given his  significant renal dysfunction and his difficulty with his INR I advised the patient that I would need to discuss with our clinical pharmacist to determine the best option for him.  He voiced understanding.  I advised that if he were to develop a plan or intent to harm himself he needs to go to the emergency department.  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  Slight erythema over the distal portion.  I discussed with vascular surgery, Dr Lucky Cowboy, and he stated this was likely not infection and was likely related to some other cause.  He noted that infections of AV fistulas were fairly rare.  He advised that the patient contact his office to set up an  appointment for later this month for recheck.  I discussed this with the patient.  I advised that if he has any progressive redness or any signs of illness he needs to be evaluated immediately for this.  Arthralgia Refer back to rheumatology.  Chronic pain of both knees X-rays ordered.  Rash Nonspecific rash.  Patient reports he has a topical cream at home that he is used previously.  He would like to trial this.  He is unsure of the name.  If not improving could consider dermatology evaluation.   Orders Placed This Encounter  Procedures  . DG Knee Complete 4 Views Left    Standing Status:   Future    Number of Occurrences:   1    Standing Expiration Date:   07/13/2018    Order Specific Question:   Reason for Exam (SYMPTOM  OR DIAGNOSIS REQUIRED)    Answer:   bilateral knee pain worsening, no injury    Order Specific Question:   Preferred imaging location?    Answer:   [facility] Specific Question:   Radiology Contrast Protocol - do NOT remove file path    Answer:   \\[email or web address]\[email or web address]\Radiant\DXFluoroContrastProtocols.pdf  . DG Knee Complete 4 Views Right    Standing Status:   Future    Number of Occurrences:   1    Standing Expiration Date:   07/13/2018    Order Specific Question:   Reason for Exam (SYMPTOM  OR DIAGNOSIS REQUIRED)    Answer:   bilateral knee pain worsening, no injury    Order Specific Question:   Preferred imaging location?    Answer:   [facility] Specific Question:   Radiology Contrast Protocol - do NOT remove file path    Answer:   \\[email or web address]\[email or web address]\Radiant\DXFluoroContrastProtocols.pdf  . Ambulatory referral to Rheumatology    Referral Priority:   Routine    Referral Type:   Consultation    Referral Reason:   Specialty Services Required    Requested Specialty:   Rheumatology    Number of Visits Requested:   1    No orders of the defined types were placed in this encounter.    Tommi Rumps,  MD [place]

## 2017-05-14 NOTE — Telephone Encounter (Signed)
Copied from [place]. Topic: Referral - Question >> May 14, 2017  3:09 PM Carolyn Stare wrote:  Pt call say his referral to see a Rheumatologist was sent to the wrong office. Pt said the referral should be sent to [name] Dr Ouida Sills    [phone]

## 2017-05-14 NOTE — Telephone Encounter (Signed)
Can we change this?

## 2017-05-14 NOTE — Patient Instructions (Signed)
Nice to see you. Please try the cream on your back. We will get x-rays of your knees today. We will refer you to rheumatology for further evaluation. He is contact Dr. Bunnie Domino office to set up an appointment for evaluation of your fistula. I will need to check with our pharmacist regarding depression medication and let you know. If you develop worsening redness, increased pain, fevers, or you develop thoughts of harming yourself please seek medical attention immediately.

## 2017-05-15 DIAGNOSIS — E440 Moderate protein-calorie malnutrition: Secondary | ICD-10-CM | POA: Diagnosis not present

## 2017-05-15 DIAGNOSIS — D631 Anemia in chronic kidney disease: Secondary | ICD-10-CM | POA: Diagnosis not present

## 2017-05-15 DIAGNOSIS — Z79899 Other long term (current) drug therapy: Secondary | ICD-10-CM | POA: Diagnosis not present

## 2017-05-15 DIAGNOSIS — D509 Iron deficiency anemia, unspecified: Secondary | ICD-10-CM | POA: Diagnosis not present

## 2017-05-15 DIAGNOSIS — N2581 Secondary hyperparathyroidism of renal origin: Secondary | ICD-10-CM | POA: Diagnosis not present

## 2017-05-15 DIAGNOSIS — N186 End stage renal disease: Secondary | ICD-10-CM | POA: Diagnosis not present

## 2017-05-16 DIAGNOSIS — Z79899 Other long term (current) drug therapy: Secondary | ICD-10-CM | POA: Diagnosis not present

## 2017-05-16 DIAGNOSIS — N186 End stage renal disease: Secondary | ICD-10-CM | POA: Diagnosis not present

## 2017-05-16 DIAGNOSIS — D631 Anemia in chronic kidney disease: Secondary | ICD-10-CM | POA: Diagnosis not present

## 2017-05-16 DIAGNOSIS — N2581 Secondary hyperparathyroidism of renal origin: Secondary | ICD-10-CM | POA: Diagnosis not present

## 2017-05-16 DIAGNOSIS — E440 Moderate protein-calorie malnutrition: Secondary | ICD-10-CM | POA: Diagnosis not present

## 2017-05-16 DIAGNOSIS — D509 Iron deficiency anemia, unspecified: Secondary | ICD-10-CM | POA: Diagnosis not present

## 2017-05-17 DIAGNOSIS — R21 Rash and other nonspecific skin eruption: Secondary | ICD-10-CM | POA: Insufficient documentation

## 2017-05-17 DIAGNOSIS — F329 Major depressive disorder, single episode, unspecified: Secondary | ICD-10-CM | POA: Insufficient documentation

## 2017-05-17 DIAGNOSIS — E440 Moderate protein-calorie malnutrition: Secondary | ICD-10-CM | POA: Diagnosis not present

## 2017-05-17 DIAGNOSIS — M2550 Pain in unspecified joint: Secondary | ICD-10-CM | POA: Insufficient documentation

## 2017-05-17 DIAGNOSIS — D509 Iron deficiency anemia, unspecified: Secondary | ICD-10-CM | POA: Diagnosis not present

## 2017-05-17 DIAGNOSIS — N186 End stage renal disease: Secondary | ICD-10-CM | POA: Diagnosis not present

## 2017-05-17 DIAGNOSIS — G8929 Other chronic pain: Secondary | ICD-10-CM | POA: Insufficient documentation

## 2017-05-17 DIAGNOSIS — M25562 Pain in left knee: Principal | ICD-10-CM

## 2017-05-17 DIAGNOSIS — D631 Anemia in chronic kidney disease: Secondary | ICD-10-CM | POA: Diagnosis not present

## 2017-05-17 DIAGNOSIS — Z79899 Other long term (current) drug therapy: Secondary | ICD-10-CM | POA: Diagnosis not present

## 2017-05-17 DIAGNOSIS — F32A Depression, unspecified: Secondary | ICD-10-CM | POA: Insufficient documentation

## 2017-05-17 DIAGNOSIS — M25561 Pain in right knee: Principal | ICD-10-CM

## 2017-05-17 DIAGNOSIS — I770 Arteriovenous fistula, acquired: Secondary | ICD-10-CM | POA: Insufficient documentation

## 2017-05-17 DIAGNOSIS — N2581 Secondary hyperparathyroidism of renal origin: Secondary | ICD-10-CM | POA: Diagnosis not present

## 2017-05-17 NOTE — Assessment & Plan Note (Signed)
Urology referral previously placed.

## 2017-05-17 NOTE — Assessment & Plan Note (Signed)
Not currently in use.  Slight erythema over the distal portion.  I discussed with vascular surgery, Dr Lucky Cowboy, and he stated this was likely not infection and was likely related to some other cause.  He noted that infections of AV fistulas were fairly rare.  He advised that the patient contact his office to set up an appointment for later this month for recheck.  I discussed this with the patient.  I advised that if he has any progressive redness or any signs of illness he needs to be evaluated immediately for this.

## 2017-05-17 NOTE — Assessment & Plan Note (Signed)
Refer back to rheumatology.

## 2017-05-17 NOTE — Assessment & Plan Note (Addendum)
INR has been elevated.  Advised to follow the clinical pharmacist directions on his Coumadin.  He will not use the hemp oil anymore.

## 2017-05-17 NOTE — Assessment & Plan Note (Addendum)
Nonspecific rash.  Patient reports he has a topical cream at home that he is used previously.  He would like to trial this.  He is unsure of the name.  If not improving could consider dermatology evaluation.

## 2017-05-17 NOTE — Assessment & Plan Note (Signed)
Xrays ordered.

## 2017-05-17 NOTE — Assessment & Plan Note (Signed)
Patient with pretty severe depression.  Discussed starting on medication for this.  Given his significant renal dysfunction and his difficulty with his INR I advised the patient that I would need to discuss with our clinical pharmacist to determine the best option for him.  He voiced understanding.  I advised that if he were to develop a plan or intent to harm himself he needs to go to the emergency department.

## 2017-05-18 ENCOUNTER — Ambulatory Visit (INDEPENDENT_AMBULATORY_CARE_PROVIDER_SITE_OTHER): Payer: Medicare Other | Admitting: Pharmacist

## 2017-05-18 ENCOUNTER — Telehealth: Payer: Self-pay | Admitting: Pharmacist

## 2017-05-18 ENCOUNTER — Telehealth: Payer: Self-pay | Admitting: Family Medicine

## 2017-05-18 DIAGNOSIS — I4819 Other persistent atrial fibrillation: Secondary | ICD-10-CM

## 2017-05-18 DIAGNOSIS — I481 Persistent atrial fibrillation: Secondary | ICD-10-CM

## 2017-05-18 DIAGNOSIS — E440 Moderate protein-calorie malnutrition: Secondary | ICD-10-CM | POA: Diagnosis not present

## 2017-05-18 DIAGNOSIS — D631 Anemia in chronic kidney disease: Secondary | ICD-10-CM | POA: Diagnosis not present

## 2017-05-18 DIAGNOSIS — N2581 Secondary hyperparathyroidism of renal origin: Secondary | ICD-10-CM | POA: Diagnosis not present

## 2017-05-18 DIAGNOSIS — Z7901 Long term (current) use of anticoagulants: Secondary | ICD-10-CM

## 2017-05-18 DIAGNOSIS — Z952 Presence of prosthetic heart valve: Secondary | ICD-10-CM | POA: Diagnosis not present

## 2017-05-18 DIAGNOSIS — I482 Chronic atrial fibrillation: Secondary | ICD-10-CM | POA: Diagnosis not present

## 2017-05-18 DIAGNOSIS — Z79899 Other long term (current) drug therapy: Secondary | ICD-10-CM | POA: Diagnosis not present

## 2017-05-18 DIAGNOSIS — N186 End stage renal disease: Secondary | ICD-10-CM | POA: Diagnosis not present

## 2017-05-18 DIAGNOSIS — D509 Iron deficiency anemia, unspecified: Secondary | ICD-10-CM | POA: Diagnosis not present

## 2017-05-18 LAB — POCT INR: INR: 2.3

## 2017-05-18 MED ORDER — FLUOXETINE HCL 10 MG PO CAPS: 10.0000 mg | ORAL_CAPSULE | Freq: Every day | ORAL | 3 refills | Status: DC

## 2017-05-18 NOTE — Telephone Encounter (Signed)
Patient calls to report sx of gout in his finger. He states that he has an old prescription for prednisone from another provider that he no longer follows with. He inquires if celery seed or vinegar will help with gout tx.   Advised patient to avoid purine-rich foods and not use the herbal products at this time.   Will forward to Dr. Caryl Bis to address.   Carlean Jews, Pharm.D., BCPS PGY2 Ambulatory Care Pharmacy Resident Phone: [phone]

## 2017-05-18 NOTE — Telephone Encounter (Signed)
Please check with the patient to see what kind of symptoms he is having.  Thanks.

## 2017-05-18 NOTE — Addendum Note (Signed)
Addended by: Leone Haven on: 05/18/2017 04:52 PM   Modules accepted: Orders

## 2017-05-18 NOTE — Telephone Encounter (Signed)
Prozac sent to pharmacy.  I sent him a message through my chart regarding his x-rays.  They showed mild arthritis.  We will have him see rheumatology as planned.  There is also a message from [place] regarding possible gout.  Please see what symptoms he is having.

## 2017-05-18 NOTE — Telephone Encounter (Signed)
Dr.Sonnenberg pt. 

## 2017-05-18 NOTE — Telephone Encounter (Signed)
Please let the patient know that I heard back from [place] regarding the antidepressant.  Given his allergy to Zoloft I would suggest we try Prozac for his depression.  I can send this to his pharmacy if he is willing.  Thanks.

## 2017-05-18 NOTE — Telephone Encounter (Signed)
It was sent to the correct Rheumatologist.

## 2017-05-18 NOTE — Progress Notes (Signed)
I have reviewed the above note and agree. I was available to the pharmacist for consultation.  Ashly Goethe, MD  

## 2017-05-18 NOTE — Telephone Encounter (Signed)
Patient notified and would like to try the Prozac, he is also wondering about his xray results

## 2017-05-18 NOTE — Telephone Encounter (Signed)
-----   Message from Carlean Jews, [facility] sent at 05/15/2017 11:38 AM EST ----- Hey!   Here are the SSRIs that are not renally cleared that don't have dose adjustments:  - Fluoxetine, Sertraline  I found a review article for sertraline in PD patients that showed it was efficacious for tx of depressive sx. Small population, but at least it's something! (Int Urol Nephrol. 2010 Jun;42(2):527-36.)   I'd start him at sertraline 25-50 mg daily. Package labeling states start at 50 mg/day. Up to you!   Thanks,  Chrys Racer   ----- Message ----- From: Leone Haven, MD Sent: 05/14/2017   6:33 PM To: Carlean Jews, [place],   Mr Hinshaw expressed some issues with depression to me during his visit today and wanted to start on medication. Given his renal issues and with his other medications and medical issues I wanted to check with you to see what the best medication would be given his circumstances. Some of the SSRIs I looked at had warnings regarding renal function. Thanks for your help.  Randall Hiss

## 2017-05-19 ENCOUNTER — Telehealth: Payer: Self-pay | Admitting: Family Medicine

## 2017-05-19 DIAGNOSIS — E440 Moderate protein-calorie malnutrition: Secondary | ICD-10-CM | POA: Diagnosis not present

## 2017-05-19 DIAGNOSIS — D631 Anemia in chronic kidney disease: Secondary | ICD-10-CM | POA: Diagnosis not present

## 2017-05-19 DIAGNOSIS — D509 Iron deficiency anemia, unspecified: Secondary | ICD-10-CM | POA: Diagnosis not present

## 2017-05-19 DIAGNOSIS — Z79899 Other long term (current) drug therapy: Secondary | ICD-10-CM | POA: Diagnosis not present

## 2017-05-19 DIAGNOSIS — N2581 Secondary hyperparathyroidism of renal origin: Secondary | ICD-10-CM | POA: Diagnosis not present

## 2017-05-19 DIAGNOSIS — N186 End stage renal disease: Secondary | ICD-10-CM | POA: Diagnosis not present

## 2017-05-19 NOTE — Telephone Encounter (Signed)
Patient is returning call- he was notified about his Rx for Prozac sent to the pharmacy. He was also notified about his X-ray results.  He did speak to [place] earlier today- but he is having symptoms he thinks are related to gout. Both hands are bothering him- but the left hand is worse. His knuckles are shiny and swollen with wrist pain. He states he had a Prednisone dose pack on hand and he has started that. He would like for Dr Caryl Bis to send him a Rx to have on hand for his next flare. He is going to contact [place] because the last time he had to use prednisone- his INR was affected.  He also would like to follow up of his request for referral to Dr Ouida Sills- Rheumatology.

## 2017-05-19 NOTE — Telephone Encounter (Signed)
Noted. If this is not improving or if it worsens he needs to let us know. Please also have him make sure the prednisone is still within its discard date. Thanks.

## 2017-05-19 NOTE — Telephone Encounter (Signed)
Please advise 

## 2017-05-19 NOTE — Telephone Encounter (Signed)
Patient notified

## 2017-05-19 NOTE — Telephone Encounter (Signed)
Patient states he started a prednisone taper pack last night, he states his index finger on both hands started swelling with redness with pain radiating form finger to the wrist since Friday.  Started 5 day taper last night.

## 2017-05-19 NOTE — Telephone Encounter (Signed)
Patient notified and rheumatology referral has been faxed

## 2017-05-19 NOTE — Telephone Encounter (Signed)
Left message to return call to let patient know that if it is not improving or it is worsening he needs to let us know. Also to check if the prednisone is still within its discard date. [place] for pec to speak to patient

## 2017-05-19 NOTE — Telephone Encounter (Signed)
Noted.  Unfortunately prednisone is not something I give to people just to have on hand.  Please check to make sure his rheumatology referral has been sent out.  Thanks.

## 2017-05-20 DIAGNOSIS — E440 Moderate protein-calorie malnutrition: Secondary | ICD-10-CM | POA: Diagnosis not present

## 2017-05-20 DIAGNOSIS — Z79899 Other long term (current) drug therapy: Secondary | ICD-10-CM | POA: Diagnosis not present

## 2017-05-20 DIAGNOSIS — N186 End stage renal disease: Secondary | ICD-10-CM | POA: Diagnosis not present

## 2017-05-20 DIAGNOSIS — D509 Iron deficiency anemia, unspecified: Secondary | ICD-10-CM | POA: Diagnosis not present

## 2017-05-20 DIAGNOSIS — D631 Anemia in chronic kidney disease: Secondary | ICD-10-CM | POA: Diagnosis not present

## 2017-05-20 DIAGNOSIS — N2581 Secondary hyperparathyroidism of renal origin: Secondary | ICD-10-CM | POA: Diagnosis not present

## 2017-05-21 DIAGNOSIS — D509 Iron deficiency anemia, unspecified: Secondary | ICD-10-CM | POA: Diagnosis not present

## 2017-05-21 DIAGNOSIS — N186 End stage renal disease: Secondary | ICD-10-CM | POA: Diagnosis not present

## 2017-05-21 DIAGNOSIS — Z79899 Other long term (current) drug therapy: Secondary | ICD-10-CM | POA: Diagnosis not present

## 2017-05-21 DIAGNOSIS — E440 Moderate protein-calorie malnutrition: Secondary | ICD-10-CM | POA: Diagnosis not present

## 2017-05-21 DIAGNOSIS — D631 Anemia in chronic kidney disease: Secondary | ICD-10-CM | POA: Diagnosis not present

## 2017-05-21 DIAGNOSIS — N2581 Secondary hyperparathyroidism of renal origin: Secondary | ICD-10-CM | POA: Diagnosis not present

## 2017-05-22 DIAGNOSIS — D631 Anemia in chronic kidney disease: Secondary | ICD-10-CM | POA: Diagnosis not present

## 2017-05-22 DIAGNOSIS — N2581 Secondary hyperparathyroidism of renal origin: Secondary | ICD-10-CM | POA: Diagnosis not present

## 2017-05-22 DIAGNOSIS — Z79899 Other long term (current) drug therapy: Secondary | ICD-10-CM | POA: Diagnosis not present

## 2017-05-22 DIAGNOSIS — N186 End stage renal disease: Secondary | ICD-10-CM | POA: Diagnosis not present

## 2017-05-22 DIAGNOSIS — D509 Iron deficiency anemia, unspecified: Secondary | ICD-10-CM | POA: Diagnosis not present

## 2017-05-22 DIAGNOSIS — E440 Moderate protein-calorie malnutrition: Secondary | ICD-10-CM | POA: Diagnosis not present

## 2017-05-23 DIAGNOSIS — N2581 Secondary hyperparathyroidism of renal origin: Secondary | ICD-10-CM | POA: Diagnosis not present

## 2017-05-23 DIAGNOSIS — Z79899 Other long term (current) drug therapy: Secondary | ICD-10-CM | POA: Diagnosis not present

## 2017-05-23 DIAGNOSIS — N186 End stage renal disease: Secondary | ICD-10-CM | POA: Diagnosis not present

## 2017-05-23 DIAGNOSIS — E440 Moderate protein-calorie malnutrition: Secondary | ICD-10-CM | POA: Diagnosis not present

## 2017-05-23 DIAGNOSIS — D631 Anemia in chronic kidney disease: Secondary | ICD-10-CM | POA: Diagnosis not present

## 2017-05-23 DIAGNOSIS — D509 Iron deficiency anemia, unspecified: Secondary | ICD-10-CM | POA: Diagnosis not present

## 2017-05-24 DIAGNOSIS — D631 Anemia in chronic kidney disease: Secondary | ICD-10-CM | POA: Diagnosis not present

## 2017-05-24 DIAGNOSIS — Z79899 Other long term (current) drug therapy: Secondary | ICD-10-CM | POA: Diagnosis not present

## 2017-05-24 DIAGNOSIS — D509 Iron deficiency anemia, unspecified: Secondary | ICD-10-CM | POA: Diagnosis not present

## 2017-05-24 DIAGNOSIS — N2581 Secondary hyperparathyroidism of renal origin: Secondary | ICD-10-CM | POA: Diagnosis not present

## 2017-05-24 DIAGNOSIS — E440 Moderate protein-calorie malnutrition: Secondary | ICD-10-CM | POA: Diagnosis not present

## 2017-05-24 DIAGNOSIS — N186 End stage renal disease: Secondary | ICD-10-CM | POA: Diagnosis not present

## 2017-05-25 ENCOUNTER — Ambulatory Visit (INDEPENDENT_AMBULATORY_CARE_PROVIDER_SITE_OTHER): Payer: Medicare Other | Admitting: Pharmacist

## 2017-05-25 DIAGNOSIS — E440 Moderate protein-calorie malnutrition: Secondary | ICD-10-CM | POA: Diagnosis not present

## 2017-05-25 DIAGNOSIS — Z952 Presence of prosthetic heart valve: Secondary | ICD-10-CM | POA: Diagnosis not present

## 2017-05-25 DIAGNOSIS — I481 Persistent atrial fibrillation: Secondary | ICD-10-CM | POA: Diagnosis not present

## 2017-05-25 DIAGNOSIS — Z79899 Other long term (current) drug therapy: Secondary | ICD-10-CM | POA: Diagnosis not present

## 2017-05-25 DIAGNOSIS — D509 Iron deficiency anemia, unspecified: Secondary | ICD-10-CM | POA: Diagnosis not present

## 2017-05-25 DIAGNOSIS — N186 End stage renal disease: Secondary | ICD-10-CM | POA: Diagnosis not present

## 2017-05-25 DIAGNOSIS — N2581 Secondary hyperparathyroidism of renal origin: Secondary | ICD-10-CM | POA: Diagnosis not present

## 2017-05-25 DIAGNOSIS — Z7901 Long term (current) use of anticoagulants: Secondary | ICD-10-CM

## 2017-05-25 DIAGNOSIS — D631 Anemia in chronic kidney disease: Secondary | ICD-10-CM | POA: Diagnosis not present

## 2017-05-25 DIAGNOSIS — I4819 Other persistent atrial fibrillation: Secondary | ICD-10-CM

## 2017-05-25 LAB — POCT INR: INR: 2.4

## 2017-05-25 NOTE — Progress Notes (Signed)
I have reviewed the above note and agree. I was available to the pharmacist for consultation.  Karlyn Glasco, MD  

## 2017-05-26 DIAGNOSIS — N2581 Secondary hyperparathyroidism of renal origin: Secondary | ICD-10-CM | POA: Diagnosis not present

## 2017-05-26 DIAGNOSIS — D509 Iron deficiency anemia, unspecified: Secondary | ICD-10-CM | POA: Diagnosis not present

## 2017-05-26 DIAGNOSIS — Z79899 Other long term (current) drug therapy: Secondary | ICD-10-CM | POA: Diagnosis not present

## 2017-05-26 DIAGNOSIS — N186 End stage renal disease: Secondary | ICD-10-CM | POA: Diagnosis not present

## 2017-05-26 DIAGNOSIS — E440 Moderate protein-calorie malnutrition: Secondary | ICD-10-CM | POA: Diagnosis not present

## 2017-05-26 DIAGNOSIS — D631 Anemia in chronic kidney disease: Secondary | ICD-10-CM | POA: Diagnosis not present

## 2017-05-27 DIAGNOSIS — Z79899 Other long term (current) drug therapy: Secondary | ICD-10-CM | POA: Diagnosis not present

## 2017-05-27 DIAGNOSIS — N186 End stage renal disease: Secondary | ICD-10-CM | POA: Diagnosis not present

## 2017-05-27 DIAGNOSIS — D631 Anemia in chronic kidney disease: Secondary | ICD-10-CM | POA: Diagnosis not present

## 2017-05-27 DIAGNOSIS — E440 Moderate protein-calorie malnutrition: Secondary | ICD-10-CM | POA: Diagnosis not present

## 2017-05-27 DIAGNOSIS — D509 Iron deficiency anemia, unspecified: Secondary | ICD-10-CM | POA: Diagnosis not present

## 2017-05-27 DIAGNOSIS — N2581 Secondary hyperparathyroidism of renal origin: Secondary | ICD-10-CM | POA: Diagnosis not present

## 2017-05-28 ENCOUNTER — Encounter: Payer: Self-pay | Admitting: Family Medicine

## 2017-05-28 DIAGNOSIS — E440 Moderate protein-calorie malnutrition: Secondary | ICD-10-CM | POA: Diagnosis not present

## 2017-05-28 DIAGNOSIS — N2581 Secondary hyperparathyroidism of renal origin: Secondary | ICD-10-CM | POA: Diagnosis not present

## 2017-05-28 DIAGNOSIS — Z79899 Other long term (current) drug therapy: Secondary | ICD-10-CM | POA: Diagnosis not present

## 2017-05-28 DIAGNOSIS — M109 Gout, unspecified: Secondary | ICD-10-CM

## 2017-05-28 DIAGNOSIS — N186 End stage renal disease: Secondary | ICD-10-CM | POA: Diagnosis not present

## 2017-05-28 DIAGNOSIS — D631 Anemia in chronic kidney disease: Secondary | ICD-10-CM | POA: Diagnosis not present

## 2017-05-28 DIAGNOSIS — D509 Iron deficiency anemia, unspecified: Secondary | ICD-10-CM | POA: Diagnosis not present

## 2017-05-29 DIAGNOSIS — D509 Iron deficiency anemia, unspecified: Secondary | ICD-10-CM | POA: Diagnosis not present

## 2017-05-29 DIAGNOSIS — Z79899 Other long term (current) drug therapy: Secondary | ICD-10-CM | POA: Diagnosis not present

## 2017-05-29 DIAGNOSIS — E440 Moderate protein-calorie malnutrition: Secondary | ICD-10-CM | POA: Diagnosis not present

## 2017-05-29 DIAGNOSIS — D631 Anemia in chronic kidney disease: Secondary | ICD-10-CM | POA: Diagnosis not present

## 2017-05-29 DIAGNOSIS — N2581 Secondary hyperparathyroidism of renal origin: Secondary | ICD-10-CM | POA: Diagnosis not present

## 2017-05-29 DIAGNOSIS — N186 End stage renal disease: Secondary | ICD-10-CM | POA: Diagnosis not present

## 2017-05-29 NOTE — Telephone Encounter (Signed)
I have pended meds except for metoprolol, I do not see this in his med list

## 2017-05-30 DIAGNOSIS — N186 End stage renal disease: Secondary | ICD-10-CM | POA: Diagnosis not present

## 2017-05-30 DIAGNOSIS — D509 Iron deficiency anemia, unspecified: Secondary | ICD-10-CM | POA: Diagnosis not present

## 2017-05-30 DIAGNOSIS — N2581 Secondary hyperparathyroidism of renal origin: Secondary | ICD-10-CM | POA: Diagnosis not present

## 2017-05-30 DIAGNOSIS — Z79899 Other long term (current) drug therapy: Secondary | ICD-10-CM | POA: Diagnosis not present

## 2017-05-30 DIAGNOSIS — D631 Anemia in chronic kidney disease: Secondary | ICD-10-CM | POA: Diagnosis not present

## 2017-05-30 DIAGNOSIS — E440 Moderate protein-calorie malnutrition: Secondary | ICD-10-CM | POA: Diagnosis not present

## 2017-05-31 DIAGNOSIS — D631 Anemia in chronic kidney disease: Secondary | ICD-10-CM | POA: Diagnosis not present

## 2017-05-31 DIAGNOSIS — Z79899 Other long term (current) drug therapy: Secondary | ICD-10-CM | POA: Diagnosis not present

## 2017-05-31 DIAGNOSIS — N2581 Secondary hyperparathyroidism of renal origin: Secondary | ICD-10-CM | POA: Diagnosis not present

## 2017-05-31 DIAGNOSIS — N186 End stage renal disease: Secondary | ICD-10-CM | POA: Diagnosis not present

## 2017-05-31 DIAGNOSIS — E440 Moderate protein-calorie malnutrition: Secondary | ICD-10-CM | POA: Diagnosis not present

## 2017-05-31 DIAGNOSIS — D509 Iron deficiency anemia, unspecified: Secondary | ICD-10-CM | POA: Diagnosis not present

## 2017-05-31 MED ORDER — METOPROLOL TARTRATE 100 MG PO TABS: 100.0000 mg | ORAL_TABLET | Freq: Two times a day (BID) | ORAL | 3 refills | Status: DC

## 2017-05-31 MED ORDER — WARFARIN SODIUM 1 MG PO TABS: 1.0000 mg | ORAL_TABLET | Freq: Every day | ORAL | 1 refills | Status: DC

## 2017-05-31 MED ORDER — OMEPRAZOLE 20 MG PO CPDR: 20.0000 mg | DELAYED_RELEASE_CAPSULE | Freq: Every day | ORAL | 1 refills | Status: DC

## 2017-05-31 MED ORDER — ALLOPURINOL 100 MG PO TABS: 200.0000 mg | ORAL_TABLET | Freq: Every day | ORAL | 1 refills | Status: DC

## 2017-05-31 NOTE — Telephone Encounter (Signed)
Please follow-up with the patient on Monday to ensure that he got the my chart message and confirm my questions regarding the lanthanum that he requested a refill for.  Thanks.

## 2017-06-01 ENCOUNTER — Telehealth: Payer: Self-pay | Admitting: Family Medicine

## 2017-06-01 ENCOUNTER — Ambulatory Visit (INDEPENDENT_AMBULATORY_CARE_PROVIDER_SITE_OTHER): Payer: Medicare Other | Admitting: Pharmacist

## 2017-06-01 ENCOUNTER — Telehealth: Payer: Self-pay | Admitting: Pharmacist

## 2017-06-01 DIAGNOSIS — D509 Iron deficiency anemia, unspecified: Secondary | ICD-10-CM | POA: Diagnosis not present

## 2017-06-01 DIAGNOSIS — Z952 Presence of prosthetic heart valve: Secondary | ICD-10-CM

## 2017-06-01 DIAGNOSIS — N186 End stage renal disease: Secondary | ICD-10-CM | POA: Diagnosis not present

## 2017-06-01 DIAGNOSIS — D631 Anemia in chronic kidney disease: Secondary | ICD-10-CM | POA: Diagnosis not present

## 2017-06-01 DIAGNOSIS — N2581 Secondary hyperparathyroidism of renal origin: Secondary | ICD-10-CM | POA: Diagnosis not present

## 2017-06-01 DIAGNOSIS — E440 Moderate protein-calorie malnutrition: Secondary | ICD-10-CM | POA: Diagnosis not present

## 2017-06-01 DIAGNOSIS — I481 Persistent atrial fibrillation: Secondary | ICD-10-CM

## 2017-06-01 DIAGNOSIS — Z79899 Other long term (current) drug therapy: Secondary | ICD-10-CM | POA: Diagnosis not present

## 2017-06-01 DIAGNOSIS — I4819 Other persistent atrial fibrillation: Secondary | ICD-10-CM

## 2017-06-01 DIAGNOSIS — Z7901 Long term (current) use of anticoagulants: Secondary | ICD-10-CM

## 2017-06-01 LAB — POCT INR: INR: 3.1

## 2017-06-01 NOTE — Telephone Encounter (Signed)
Patient asks if he can trial pravastatin 1-3 times weekly. Discussed efficacy of this and benefit of this over not taking statin at all.   Will send to Dr. Caryl Bis to advise.   Carlean Jews, Pharm.D., BCPS PGY2 Ambulatory Care Pharmacy Resident Phone: [phone]

## 2017-06-01 NOTE — Telephone Encounter (Signed)
fyi

## 2017-06-01 NOTE — Telephone Encounter (Signed)
Copied from [place]. Topic: Quick Communication - See Telephone Encounter >> Jun 01, 2017 12:06 PM Lolita Rieger, RMA wrote: CRM for notification. See Telephone encounter for:   06/01/17.pt returning call please call back

## 2017-06-01 NOTE — Telephone Encounter (Signed)
Left message to return call, ok for pec to speak to patient and ask questions regarding lanthanum

## 2017-06-01 NOTE — Telephone Encounter (Signed)
See other message

## 2017-06-01 NOTE — Telephone Encounter (Signed)
Pt. Called back. Given Dr. Ellen Henri message. States he will Have his nephrologist, Dr. Smith Mince, refill his lanthanum.( He prescribed it) He is currently on 3000 mg po three times a day with meals. Pt. States he appreciates the Rheumatology referral.

## 2017-06-01 NOTE — Telephone Encounter (Signed)
Copied from [place] [id number]. Topic: Quick Communication - Office Called Patient >> Jun 01, 2017 11:08 AM Jeremy [name], CMA wrote: Left message to return call, ok for pec to speak to patient and ask questions regarding lanthanum  Dr.Sonnenberg's message I sent in your medication refills with the exception of the lanthanum.  Please confirm your dosing of that as it is quite a high dose.  Please also confirm who has prescribed this to you previously.  This is typically something that is prescribed by nephrology and in the future I would prefer if it came from your nephrologist.  I have also placed a referral to rheumatology.  Dr. Caryl Bis.

## 2017-06-01 NOTE — Progress Notes (Signed)
  I have reviewed the above information and agree with above.   Ethal Gotay, MD 

## 2017-06-01 NOTE — Telephone Encounter (Signed)
Call returned to pt.  He wanted to make Dr. Caryl Bis aware that Dr. Ouida Sills @ [facility] is no longer taking new patients.  Advised will make Dr. Caryl Bis aware.

## 2017-06-01 NOTE — Telephone Encounter (Unsigned)
Copied from [place] [id number]. Topic: Quick Communication - Office Called Patient >> Jun 01, 2017 11:08 AM Juanda Chance, CMA wrote: Left message to return call, ok for pec to speak to patient and ask questions regarding lanthanum  Dr.Sonnenberg's message I sent in your medication refills with the exception of the lanthanum.  Please confirm your dosing of that as it is quite a high dose.  Please also confirm who has prescribed this to you previously.  This is typically something that is prescribed by nephrology and in the future I would prefer if it came from your nephrologist.  I have also placed a referral to rheumatology.  Dr. Caryl Bis.

## 2017-06-02 DIAGNOSIS — N2581 Secondary hyperparathyroidism of renal origin: Secondary | ICD-10-CM | POA: Diagnosis not present

## 2017-06-02 DIAGNOSIS — D631 Anemia in chronic kidney disease: Secondary | ICD-10-CM | POA: Diagnosis not present

## 2017-06-02 DIAGNOSIS — N186 End stage renal disease: Secondary | ICD-10-CM | POA: Diagnosis not present

## 2017-06-02 DIAGNOSIS — Z79899 Other long term (current) drug therapy: Secondary | ICD-10-CM | POA: Diagnosis not present

## 2017-06-02 DIAGNOSIS — D509 Iron deficiency anemia, unspecified: Secondary | ICD-10-CM | POA: Diagnosis not present

## 2017-06-02 DIAGNOSIS — E440 Moderate protein-calorie malnutrition: Secondary | ICD-10-CM | POA: Diagnosis not present

## 2017-06-02 MED ORDER — PRAVASTATIN SODIUM 10 MG PO TABS: ORAL_TABLET | ORAL | 2 refills | Status: DC

## 2017-06-02 NOTE — Telephone Encounter (Signed)
Called patient to inform him Rx sent. Patient verbalizes understanding. Will f/u tolerance next Monday.   Carlean Jews, Pharm.D., BCPS PGY2 Ambulatory Care Pharmacy Resident Phone: [phone]

## 2017-06-02 NOTE — Telephone Encounter (Signed)
We could try 3 times a week dosing and see how he tolerates this. I sent pravastatin to his pharmacy.

## 2017-06-02 NOTE — Telephone Encounter (Signed)
Noted. I will forward to [place] to schedule him with a different rheumatologist.

## 2017-06-03 DIAGNOSIS — E440 Moderate protein-calorie malnutrition: Secondary | ICD-10-CM | POA: Diagnosis not present

## 2017-06-03 DIAGNOSIS — D509 Iron deficiency anemia, unspecified: Secondary | ICD-10-CM | POA: Diagnosis not present

## 2017-06-03 DIAGNOSIS — N186 End stage renal disease: Secondary | ICD-10-CM | POA: Diagnosis not present

## 2017-06-03 DIAGNOSIS — N2581 Secondary hyperparathyroidism of renal origin: Secondary | ICD-10-CM | POA: Diagnosis not present

## 2017-06-03 DIAGNOSIS — Z79899 Other long term (current) drug therapy: Secondary | ICD-10-CM | POA: Diagnosis not present

## 2017-06-03 DIAGNOSIS — D631 Anemia in chronic kidney disease: Secondary | ICD-10-CM | POA: Diagnosis not present

## 2017-06-04 ENCOUNTER — Encounter: Payer: Self-pay | Admitting: Urology

## 2017-06-04 ENCOUNTER — Ambulatory Visit (INDEPENDENT_AMBULATORY_CARE_PROVIDER_SITE_OTHER): Payer: Medicare Other | Admitting: Urology

## 2017-06-04 VITALS — BP 138/69 | HR 71 | Ht 70.0 in | Wt 183.0 lb

## 2017-06-04 DIAGNOSIS — Z125 Encounter for screening for malignant neoplasm of prostate: Secondary | ICD-10-CM

## 2017-06-04 DIAGNOSIS — N2581 Secondary hyperparathyroidism of renal origin: Secondary | ICD-10-CM | POA: Diagnosis not present

## 2017-06-04 DIAGNOSIS — N400 Enlarged prostate without lower urinary tract symptoms: Secondary | ICD-10-CM

## 2017-06-04 DIAGNOSIS — Z79899 Other long term (current) drug therapy: Secondary | ICD-10-CM | POA: Diagnosis not present

## 2017-06-04 DIAGNOSIS — R7989 Other specified abnormal findings of blood chemistry: Secondary | ICD-10-CM

## 2017-06-04 DIAGNOSIS — E440 Moderate protein-calorie malnutrition: Secondary | ICD-10-CM | POA: Diagnosis not present

## 2017-06-04 DIAGNOSIS — D631 Anemia in chronic kidney disease: Secondary | ICD-10-CM | POA: Diagnosis not present

## 2017-06-04 DIAGNOSIS — T8610 Unspecified complication of kidney transplant: Secondary | ICD-10-CM | POA: Diagnosis not present

## 2017-06-04 DIAGNOSIS — D509 Iron deficiency anemia, unspecified: Secondary | ICD-10-CM | POA: Diagnosis not present

## 2017-06-04 DIAGNOSIS — Z992 Dependence on renal dialysis: Secondary | ICD-10-CM | POA: Diagnosis not present

## 2017-06-04 DIAGNOSIS — N186 End stage renal disease: Secondary | ICD-10-CM | POA: Diagnosis not present

## 2017-06-04 NOTE — Progress Notes (Signed)
06/04/2017 9:17 AM   Jeremy [name] [birth year] [id number]  Referring provider: Leone Haven, MD [address] [place], [place] [address]  Chief Complaint  Patient presents with  . 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  He has been on testosterone injection therapy previously.  His most recent testosterone was gently 2019 was 200.  His PSA was 3.12 in January 2019 which is up from 2.45 one month prior.  Hematocrit was not elevated at 33.  Per the patient, he was started on his testosterone for muscle aches and low hemoglobin.  He also notes fatigue.  He denies changes in, muscle loss, or erectile dysfunction.  This medication was being prescribed for him by his hematologist.  He has no other complaints at this time.  He is unaware of the risk of testosterone replacement therapy.   PMH: Past Medical History:  Diagnosis Date  . 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 Arthritis   . Chronic anticoagulation 06/2012  . Dialysis patient [facility]) 01 01 2008  . Dysrhythmia    A Fib  . Hx of colonoscopy    2009 by Dr. Laural Golden. Normal except for small submucosal lipoma. No evidence of polyps or colitis.  . Hyperlipidemia   . Hypertension   . Hypogonadism in male 07/25/2015  . 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  Marland Kitchen OSA (obstructive sleep apnea)    No CPAP  . Renal cell carcinoma ([place]) 08/04/2016    Surgical History: Past Surgical History:  Procedure Laterality Date  . AORTIC VALVE REPLACEMENT  3 /11 /2014   At [facility]    . CAPD INSERTION N/A 02/19/2017   Procedure: LAPAROSCOPIC INSERTION CONTINUOUS AMBULATORY PERITONEAL DIALYSIS  (CAPD) CATHETER;  Surgeon: Algernon Huxley, MD;  Location: [facility] ORS;  Service: General;  Laterality: N/A;  . CORONARY ANGIOPLASTY WITH STENT PLACEMENT    . DG AV DIALYSIS  SHUNT ACCESS EXIST*L* OR     left arm  . EPIGASTRIC HERNIA REPAIR N/A 02/19/2017   Procedure: HERNIA REPAIR EPIGASTRIC ADULT;  Surgeon: Robert Bellow, MD;  Location: [facility] ORS;  Service: General;  Laterality: N/A;  . EYE SURGERY     bilateral cataract  . HERNIA REPAIR     UMBILICAL HERNIA  . HERNIA REPAIR  02/19/2017  . INNER EAR SURGERY     20 years ago  . KIDNEY TRANSPLANT    . Peritoneal Dialysis access placement  02/19/2017  . TEE WITHOUT CARDIOVERSION N/A 07/21/2016   Procedure: TRANSESOPHAGEAL ECHOCARDIOGRAM (TEE);  Surgeon: Wellington Hampshire, MD;  Location: [facility] ORS;  Service: Cardiovascular;  Laterality: N/A;    Home Medications:  Allergies as of 06/04/2017      Reactions   Statins    Other reaction(s): Arthralgias (intolerance), Myalgias (intolerance)   Sertraline Rash      Medication List        Accurate as of 06/04/17 11:59 PM. Always use your most recent med list.          acetaminophen 500 MG tablet Commonly known as:  TYLENOL Take 1,000 mg 2 (two)  times daily as needed by mouth for moderate pain.   allopurinol 100 MG tablet Commonly known as:  ZYLOPRIM Take 2 tablets (200 mg total) by mouth daily.   cinacalcet 60 MG tablet Commonly known as:  SENSIPAR Take 60 mg by mouth daily. At dialysis   dicyclomine 10 MG capsule Commonly known as:  BENTYL Take 2 capsules (20 mg total) by mouth 3 (three) times daily before meals.   diphenhydrAMINE HCl (Sleep) 50 MG Caps Take 50 mg at bedtime as needed by mouth (sleep).   Evolocumab 140 MG/ML Soaj Commonly known as:  REPATHA SURECLICK Inject 1 application into the skin every 14 (fourteen) days.   Fish Oil 1200 MG Caps Take 1,200 mg 2 (two) times daily by mouth.     FLUoxetine 10 MG capsule Commonly known as:  PROZAC Take 1 capsule (10 mg total) by mouth daily.   IRON PO Take by mouth.   lanthanum 1000 MG chewable tablet Commonly known as:  FOSRENOL Chew 2,000 mg 3 (three) times daily after meals by mouth.   lidocaine-prilocaine cream Commonly known as:  EMLA Apply 1 application as needed topically (port access).   metoprolol tartrate 100 MG tablet Commonly known as:  LOPRESSOR Take 1 tablet (100 mg total) by mouth 2 (two) times daily.   NAUZENE PO Take 1-2 tablets 2 (two) times daily as needed by mouth (nausea).   nitroGLYCERIN 0.4 MG SL tablet Commonly known as:  NITROSTAT Place 1 tablet (0.4 mg total) under the tongue every 5 (five) minutes as needed for chest pain.   omeprazole 20 MG capsule Commonly known as:  PRILOSEC Take 1 capsule (20 mg total) by mouth daily.   pravastatin 10 MG tablet Commonly known as:  PRAVACHOL Take one tablet by mouth on Monday, Wednesday, and friday   rOPINIRole 0.25 MG tablet Commonly known as:  REQUIP Take 0.25 mg at bedtime as needed by mouth (restless legs).   Vitamin D3 1000 units Caps Take 2,000 Units every evening by mouth.   warfarin 1 MG tablet Commonly known as:  COUMADIN Take as directed by the anticoagulation clinic. If you are unsure how to take this medication, talk to your nurse or doctor. Original instructions:  Take 1 tablet (1 mg total) by mouth daily.       Allergies:  Allergies  Allergen Reactions  . Statins     Other reaction(s): Arthralgias (intolerance), Myalgias (intolerance)  . Sertraline Rash    Family History: Family History  Problem Relation Age of Onset  . Cancer Mother        pancreatic  . Heart disease Father   . Diabetes Father     Social History:  reports that  has never smoked. he has never used smokeless tobacco. He reports that he does not drink alcohol or use drugs.  ROS: UROLOGY Frequent Urination?: No Hard to postpone urination?:  No Burning/pain with urination?: No Get up at night to urinate?: No Leakage of urine?: No Urine stream starts and stops?: No Trouble starting stream?: No Do you have to strain to urinate?: No Blood in urine?: No Urinary tract infection?: No Sexually transmitted disease?: No Injury to kidneys or bladder?: Yes Painful intercourse?: No Weak stream?: No Erection problems?: Yes Penile pain?: No  Gastrointestinal Nausea?: No Vomiting?: No Indigestion/heartburn?: No Diarrhea?: Yes Constipation?: No  Constitutional Fever: No Night sweats?: No Weight loss?: No Fatigue?: Yes  Skin Skin rash/lesions?: Yes Itching?: Yes  Eyes Blurred vision?: No Double vision?: No  Ears/Nose/Throat  Sore throat?: No Sinus problems?: No  Hematologic/Lymphatic Swollen glands?: No Easy bruising?: Yes  Cardiovascular Leg swelling?: No Chest pain?: No  Respiratory Cough?: Yes Shortness of breath?: No  Endocrine Excessive thirst?: No  Musculoskeletal Back pain?: No Joint pain?: Yes  Neurological Headaches?: No Dizziness?: No  Psychologic Depression?: Yes Anxiety?: Yes  Physical Exam: BP 138/69   Pulse 71   Ht 5\' 10"  (1.778 m)   Wt 183 lb (83 kg)   BMI 26.26 kg/m   Constitutional:  Alert and oriented, No acute distress. HEENT: [place] AT, moist mucus membranes.  Trachea midline, no masses. Cardiovascular: No clubbing, cyanosis, or edema. Respiratory: Normal respiratory effort, no increased work of breathing. GI: Abdomen is soft, nontender, nondistended, no abdominal masses GU: No CVA tenderness.  Normal phallus.  Testicles descended bilaterally benign.  DRE: 2+ smooth benign. Skin: No rashes, bruises or suspicious lesions. Lymph: No cervical or inguinal adenopathy. Neurologic: Grossly intact, no focal deficits, moving all 4 extremities. Psychiatric: Normal mood and affect.  Laboratory Data: Lab Results  Component Value Date   WBC 8.3 05/06/2017   HGB 10.6 (L)  05/06/2017   HCT 33.0 (L) 05/06/2017   MCV 101.5 (H) 05/06/2017   PLT 149 (L) 05/06/2017    Lab Results  Component Value Date   CREATININE 14.76 (H) 05/06/2017    Lab Results  Component Value Date   PSA 2.18 07/07/2016    Lab Results  Component Value Date   TESTOSTERONE 200 (L) 05/06/2017    Lab Results  Component Value Date   HGBA1C 5.2 08/08/2016    Urinalysis    Component Value Date/Time   COLORURINE AMBER (A) 07/17/2016 1531   APPEARANCEUR CLOUDY (A) 07/17/2016 1531   LABSPEC 1.030 07/17/2016 1531   PHURINE 8.0 07/17/2016 1531   GLUCOSEU 50 (A) 07/17/2016 1531   HGBUR NEGATIVE 07/17/2016 1531   BILIRUBINUR NEGATIVE 07/17/2016 1531   KETONESUR NEGATIVE 07/17/2016 1531   PROTEINUR 100 (A) 07/17/2016 1531   NITRITE NEGATIVE 07/17/2016 1531   LEUKOCYTESUR NEGATIVE 07/17/2016 1531    Assessment & Plan:    1.  Rising PSA The patient's PSA rose 0.6 in approximately 1 month.  We will repeat this in 3 months.    2.  Low testosterone I did discuss the patient is not currently a candidate for testosterone replacement therapy due to his rising PSA.  We could readdress this if his PSA normalizes on repeat in 3 months.  However, after the patient was fully informed of the risks and benefits of testosterone therapy, he currently is not interested in further pursuing testosterone.  For now, he has elected to forego any further treatment for low testosterone.  I think this is a very reasonable decision.   Return in about 3 months (around 09/01/2017) for PSA prior.  Nickie Retort, MD  [facility] [address] [place],  [address] [phone]

## 2017-06-05 DIAGNOSIS — D509 Iron deficiency anemia, unspecified: Secondary | ICD-10-CM | POA: Diagnosis not present

## 2017-06-05 DIAGNOSIS — N2581 Secondary hyperparathyroidism of renal origin: Secondary | ICD-10-CM | POA: Diagnosis not present

## 2017-06-05 DIAGNOSIS — D631 Anemia in chronic kidney disease: Secondary | ICD-10-CM | POA: Diagnosis not present

## 2017-06-05 DIAGNOSIS — N186 End stage renal disease: Secondary | ICD-10-CM | POA: Diagnosis not present

## 2017-06-05 DIAGNOSIS — N2589 Other disorders resulting from impaired renal tubular function: Secondary | ICD-10-CM | POA: Diagnosis not present

## 2017-06-06 DIAGNOSIS — N186 End stage renal disease: Secondary | ICD-10-CM | POA: Diagnosis not present

## 2017-06-06 DIAGNOSIS — D509 Iron deficiency anemia, unspecified: Secondary | ICD-10-CM | POA: Diagnosis not present

## 2017-06-06 DIAGNOSIS — D631 Anemia in chronic kidney disease: Secondary | ICD-10-CM | POA: Diagnosis not present

## 2017-06-06 DIAGNOSIS — N2589 Other disorders resulting from impaired renal tubular function: Secondary | ICD-10-CM | POA: Diagnosis not present

## 2017-06-06 DIAGNOSIS — N2581 Secondary hyperparathyroidism of renal origin: Secondary | ICD-10-CM | POA: Diagnosis not present

## 2017-06-07 DIAGNOSIS — N2581 Secondary hyperparathyroidism of renal origin: Secondary | ICD-10-CM | POA: Diagnosis not present

## 2017-06-07 DIAGNOSIS — D509 Iron deficiency anemia, unspecified: Secondary | ICD-10-CM | POA: Diagnosis not present

## 2017-06-07 DIAGNOSIS — N2589 Other disorders resulting from impaired renal tubular function: Secondary | ICD-10-CM | POA: Diagnosis not present

## 2017-06-07 DIAGNOSIS — N186 End stage renal disease: Secondary | ICD-10-CM | POA: Diagnosis not present

## 2017-06-07 DIAGNOSIS — D631 Anemia in chronic kidney disease: Secondary | ICD-10-CM | POA: Diagnosis not present

## 2017-06-08 ENCOUNTER — Ambulatory Visit (INDEPENDENT_AMBULATORY_CARE_PROVIDER_SITE_OTHER): Payer: Medicare Other | Admitting: Pharmacist

## 2017-06-08 ENCOUNTER — Telehealth: Payer: Self-pay | Admitting: Family Medicine

## 2017-06-08 DIAGNOSIS — Z952 Presence of prosthetic heart valve: Secondary | ICD-10-CM | POA: Diagnosis not present

## 2017-06-08 DIAGNOSIS — D631 Anemia in chronic kidney disease: Secondary | ICD-10-CM | POA: Diagnosis not present

## 2017-06-08 DIAGNOSIS — N186 End stage renal disease: Secondary | ICD-10-CM | POA: Diagnosis not present

## 2017-06-08 DIAGNOSIS — D509 Iron deficiency anemia, unspecified: Secondary | ICD-10-CM | POA: Diagnosis not present

## 2017-06-08 DIAGNOSIS — Z7901 Long term (current) use of anticoagulants: Secondary | ICD-10-CM

## 2017-06-08 DIAGNOSIS — E7849 Other hyperlipidemia: Secondary | ICD-10-CM | POA: Diagnosis not present

## 2017-06-08 DIAGNOSIS — I4819 Other persistent atrial fibrillation: Secondary | ICD-10-CM

## 2017-06-08 DIAGNOSIS — I481 Persistent atrial fibrillation: Secondary | ICD-10-CM | POA: Diagnosis not present

## 2017-06-08 DIAGNOSIS — N2589 Other disorders resulting from impaired renal tubular function: Secondary | ICD-10-CM | POA: Diagnosis not present

## 2017-06-08 DIAGNOSIS — N2581 Secondary hyperparathyroidism of renal origin: Secondary | ICD-10-CM | POA: Diagnosis not present

## 2017-06-08 LAB — POCT INR: INR: 2

## 2017-06-08 NOTE — Telephone Encounter (Signed)
Copied from Anna. Topic: Quick Communication - See Telephone Encounter >> Jun 08, 2017  3:40 PM Antonieta Iba C wrote: CRM for notification. See Telephone encounter for: Edythe Lynn [facility] -[phone] called in to provide pt's out of range INR for today. Pt's number is 2.0   06/08/17.

## 2017-06-08 NOTE — Progress Notes (Signed)
Patient reports he is tolerating pravastatin well, denies muscle aches. Has taken two doses. Advised him to continue three times weekly dosing.

## 2017-06-08 NOTE — Telephone Encounter (Signed)
fyi

## 2017-06-09 ENCOUNTER — Ambulatory Visit (INDEPENDENT_AMBULATORY_CARE_PROVIDER_SITE_OTHER): Payer: Medicare Other | Admitting: Vascular Surgery

## 2017-06-09 ENCOUNTER — Encounter (INDEPENDENT_AMBULATORY_CARE_PROVIDER_SITE_OTHER): Payer: Self-pay | Admitting: Vascular Surgery

## 2017-06-09 VITALS — BP 117/66 | HR 80 | Resp 15 | Ht 69.0 in | Wt 187.0 lb

## 2017-06-09 DIAGNOSIS — N2581 Secondary hyperparathyroidism of renal origin: Secondary | ICD-10-CM | POA: Diagnosis not present

## 2017-06-09 DIAGNOSIS — N186 End stage renal disease: Secondary | ICD-10-CM

## 2017-06-09 DIAGNOSIS — I770 Arteriovenous fistula, acquired: Secondary | ICD-10-CM | POA: Diagnosis not present

## 2017-06-09 DIAGNOSIS — D509 Iron deficiency anemia, unspecified: Secondary | ICD-10-CM | POA: Diagnosis not present

## 2017-06-09 DIAGNOSIS — Z992 Dependence on renal dialysis: Secondary | ICD-10-CM

## 2017-06-09 DIAGNOSIS — I10 Essential (primary) hypertension: Secondary | ICD-10-CM

## 2017-06-09 DIAGNOSIS — I2510 Atherosclerotic heart disease of native coronary artery without angina pectoris: Secondary | ICD-10-CM

## 2017-06-09 DIAGNOSIS — N2589 Other disorders resulting from impaired renal tubular function: Secondary | ICD-10-CM | POA: Diagnosis not present

## 2017-06-09 DIAGNOSIS — D631 Anemia in chronic kidney disease: Secondary | ICD-10-CM | POA: Diagnosis not present

## 2017-06-09 NOTE — Progress Notes (Signed)
I have reviewed the above note and agree. I was available to the pharmacist for consultation.  Marlyn Tondreau, MD  

## 2017-06-09 NOTE — Assessment & Plan Note (Signed)
He is now doing peritoneal dialysis which is working quite well.  No issues with that current catheter.  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 This is markedly aneurysmal and had some recent pain.  As such, I think it is reasonable to consider a duplex for full evaluation.  This will be done at his convenience.

## 2017-06-09 NOTE — Progress Notes (Signed)
MRN : [id number]  Jeremy [name] is a 62 y.o. (1955-11-11) male who presents with chief complaint of  Chief Complaint  Patient presents with  . Follow-up    Patient wants to be elvatuated  .  History of Present Illness: Patient returns today in follow up.  He is using his PD catheter and it is working fine.  He is here to discuss some pain overlying his left radiocephalic AV fistula and into his hand.  Pain is actually some better today.  He was treated for possible gout which seems to help the pain.  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  There are also some areas of hardness within the fistula as well as some aneurysmal degeneration, and given the local pain in his hand and in the fistula he presents for further evaluation.  Current Outpatient Medications  Medication Sig Dispense Refill  . acetaminophen (TYLENOL) 500 MG tablet Take 1,000 mg 2 (two) times daily as needed by mouth for moderate pain.    Marland Kitchen allopurinol (ZYLOPRIM) 100 MG tablet Take 2 tablets (200 mg total) by mouth daily. 180 tablet 1  . Cholecalciferol (VITAMIN D3) 1000 units CAPS Take 2,000 Units every evening by mouth.    . cinacalcet (SENSIPAR) 60 MG tablet Take 60 mg by mouth daily. At dialysis    . Dextrose-Fructose-Sod Citrate (NAUZENE PO) Take 1-2 tablets 2 (two) times daily as needed by mouth (nausea).    . dicyclomine (BENTYL) 10 MG capsule Take 2 capsules (20 mg total) by mouth 3 (three) times daily before meals. 90 capsule 4  . DiphenhydrAMINE HCl, Sleep, 50 MG CAPS Take 50 mg at bedtime as needed by mouth (sleep).    . Evolocumab (REPATHA SURECLICK) 294 MG/ML SOAJ Inject 1 application into the skin every 14 (fourteen) days. 2 pen 5  . FLUoxetine (PROZAC) 10 MG capsule Take 1 capsule (10 mg total) by mouth daily. 30 capsule 3  . IRON PO Take by mouth.    . lanthanum (FOSRENOL) 1000 MG chewable tablet Chew 2,000 mg 3 (three) times daily after meals by  mouth.     . lidocaine-prilocaine (EMLA) cream Apply 1 application as needed topically (port access).    . metoprolol tartrate (LOPRESSOR) 100 MG tablet Take 1 tablet (100 mg total) by mouth 2 (two) times daily. 180 tablet 3  . nitroGLYCERIN (NITROSTAT) 0.4 MG SL tablet Place 1 tablet (0.4 mg total) under the tongue every 5 (five) minutes as needed for chest pain. 25 tablet 2  . Omega-3 Fatty Acids (FISH OIL) 1200 MG CAPS Take 1,200 mg 2 (two) times daily by mouth.    Marland Kitchen omeprazole (PRILOSEC) 20 MG capsule Take 1 capsule (20 mg total) by mouth daily. 90 capsule 1  . pravastatin (PRAVACHOL) 10 MG tablet Take one tablet by mouth on Monday, Wednesday, and friday 12 tablet 2  . rOPINIRole (REQUIP) 0.25 MG tablet Take 0.25 mg at bedtime as needed by mouth (restless legs).     . warfarin (COUMADIN) 1 MG tablet Take 1 tablet (1 mg total) by mouth daily. 180 tablet 1   No current facility-administered medications for this visit.     Past Medical History:  Diagnosis Date  . 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 Arthritis   . Chronic anticoagulation 06/2012  . Dialysis patient [facility]) 01 01 2008  . Dysrhythmia    A Fib  . Hx of colonoscopy  2009 by Dr. Laural Golden. Normal except for small submucosal lipoma. No evidence of polyps or colitis.  . Hyperlipidemia   . Hypertension   . Hypogonadism in male 07/25/2015  . 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  Marland Kitchen OSA (obstructive sleep apnea)    No CPAP  . Renal cell carcinoma ([place]) 08/04/2016    Past Surgical History:  Procedure Laterality Date  . AORTIC VALVE REPLACEMENT  3 /11 /2014   At [place] [facility]    . CAPD INSERTION N/A 02/19/2017   Procedure: LAPAROSCOPIC INSERTION CONTINUOUS AMBULATORY PERITONEAL DIALYSIS  (CAPD) CATHETER;   Surgeon: Algernon Huxley, MD;  Location: [facility] ORS;  Service: General;  Laterality: N/A;  . CORONARY ANGIOPLASTY WITH STENT PLACEMENT    . DG AV DIALYSIS  SHUNT ACCESS EXIST*L* OR     left arm  . EPIGASTRIC HERNIA REPAIR N/A 02/19/2017   Procedure: HERNIA REPAIR EPIGASTRIC ADULT;  Surgeon: Robert Bellow, MD;  Location: [facility] ORS;  Service: General;  Laterality: N/A;  . EYE SURGERY     bilateral cataract  . HERNIA REPAIR     UMBILICAL HERNIA  . HERNIA REPAIR  02/19/2017  . INNER EAR SURGERY     20 years ago  . KIDNEY TRANSPLANT    . Peritoneal Dialysis access placement  02/19/2017  . TEE WITHOUT CARDIOVERSION N/A 07/21/2016   Procedure: TRANSESOPHAGEAL ECHOCARDIOGRAM (TEE);  Surgeon: Wellington Hampshire, MD;  Location: [facility] ORS;  Service: Cardiovascular;  Laterality: N/A;    Social History Social History   Tobacco Use  . Smoking status: Never Smoker  . Smokeless tobacco: Never Used  Substance Use Topics  . Alcohol use: No  . Drug use: No     Family History Family History  Problem Relation Age of Onset  . Cancer Mother        pancreatic  . Heart disease Father   . Diabetes Father      Allergies  Allergen Reactions  . Statins     Other reaction(s): Arthralgias (intolerance), Myalgias (intolerance)  . Sertraline Rash     REVIEW OF SYSTEMS (Negative unless checked)  Constitutional: '[]' Weight loss  '[]' Fever  '[]' Chills Cardiac: '[]' Chest pain   '[]' Chest pressure   '[x]' Palpitations   '[]' Shortness of breath when laying flat   '[]' Shortness of breath at rest   '[]' Shortness of breath with exertion. Vascular:  '[]' Pain in legs with walking   '[]' Pain in legs at rest   '[]' Pain in legs when laying flat   '[]' Claudication   '[]' Pain in feet when walking  '[]' Pain in feet at rest  '[]' Pain in feet when laying flat   '[]' History of DVT   '[]' Phlebitis   '[]' Swelling in legs   '[]' Varicose veins   '[]' Non-healing ulcers Pulmonary:   '[]' Uses home oxygen   '[]' Productive cough   '[]' Hemoptysis   '[]' Wheeze  '[]' COPD    '[]' Asthma Neurologic:  '[]' Dizziness  '[]' Blackouts   '[]' Seizures   '[]' History of stroke   '[]' History of TIA  '[]' Aphasia   '[]' Temporary blindness   '[]' Dysphagia   '[]' Weakness or numbness in arms   '[]' Weakness or numbness in legs Musculoskeletal:  '[x]' Arthritis   '[]' Joint swelling   '[x]' Joint pain   '[]' Low back pain Hematologic:  '[]' Easy bruising  '[]' Easy bleeding   '[]' Hypercoagulable state   '[x]' Anemic   Gastrointestinal:  '[]'   Blood in stool   '[]' Vomiting blood  '[x]' Gastroesophageal reflux/heartburn   '[]' Abdominal pain Genitourinary:  '[x]' Chronic kidney disease   '[]' Difficult urination  '[]' Frequent urination  '[]' Burning with urination   '[]' Hematuria Skin:  '[]' Rashes   '[]' Ulcers   '[]' Wounds Psychological:  '[]' History of anxiety   '[]'  History of major depression.  Physical Examination  BP 117/66 (BP Location: Right Arm, Patient Position: Sitting)   Pulse 80   Resp 15   Ht '5\' 9"'  (1.753 m)   Wt 84.8 kg (187 lb)   BMI 27.62 kg/m  Gen:  WD/WN, NAD Head: [place]/AT, No temporalis wasting. Ear/Nose/Throat: Hearing grossly intact, nares w/o erythema or drainage, trachea midline Eyes: Conjunctiva clear. Sclera non-icteric Neck: Supple.  No JVD.  Pulmonary:  Good air movement, no use of accessory muscles.  Cardiac: Irregular Vascular: Aneurysmal left radiocephalic AV fistula with a good thrill.  There is some calcification which is palpable in the AV fistula particularly near the anastomosis. Vessel Right Left  Radial Palpable Palpable                                   Gastrointestinal: soft, non-tender/non-distended.  PD catheter exiting in the left abdomen Musculoskeletal: M/S 5/5 throughout.  No deformity or atrophy.  Neurologic: Sensation grossly intact in extremities.  Symmetrical.  Speech is fluent.  Psychiatric: Judgment intact, Mood & affect appropriate for pt's clinical situation. Dermatologic: No rashes or ulcers noted.  No cellulitis or open wounds.       Labs Recent Results (from the past 2160 hour(s))   POCT INR     Status: None   Collection Time: 03/16/17 12:00 AM  Result Value Ref Range   INR 4.4   Basic metabolic panel     Status: Abnormal   Collection Time: 03/19/17  4:15 PM  Result Value Ref Range   Sodium 138 135 - 145 mmol/L   Potassium 4.4 3.5 - 5.1 mmol/L   Chloride 96 (L) 101 - 111 mmol/L   CO2 28 22 - 32 mmol/L   Glucose, Bld 91 65 - 99 mg/dL   BUN 40 (H) 6 - 20 mg/dL   Creatinine, Ser 11.01 (H) 0.61 - 1.24 mg/dL   Calcium 9.1 8.9 - 10.3 mg/dL   GFR calc non Af Amer 4 (L) >60 mL/min   GFR calc Af Amer 5 (L) >60 mL/min    Comment: (NOTE) The eGFR has been calculated using the CKD EPI equation. This calculation has not been validated in all clinical situations. eGFR's persistently <60 mL/min signify possible Chronic Kidney Disease.    Anion gap 14 5 - 15  POCT INR     Status: None   Collection Time: 03/23/17 12:00 AM  Result Value Ref Range   INR 3.5   POCT INR     Status: None   Collection Time: 03/30/17 12:00 AM  Result Value Ref Range   INR 4.6   POCT INR     Status: None   Collection Time: 04/06/17 12:00 AM  Result Value Ref Range   INR 2.9   POCT INR     Status: None   Collection Time: 04/06/17 12:00 AM  Result Value Ref Range   INR 2.9   CBC with Differential/Platelet     Status: Abnormal   Collection Time: 04/08/17 12:10 PM  Result Value Ref Range   WBC 7.4 4.0 - 10.5 K/uL   RBC 3.27 (L) 4.22 -  5.81 MIL/uL   Hemoglobin 10.7 (L) 13.0 - 17.0 g/dL   HCT 33.7 (L) 39.0 - 52.0 %   MCV 103.1 (H) 78.0 - 100.0 fL   MCH 32.7 26.0 - 34.0 pg   MCHC 31.8 30.0 - 36.0 g/dL   RDW 14.5 11.5 - 15.5 %   Platelets 149 (L) 150 - 400 K/uL   Neutrophils Relative % 60 %   Neutro Abs 4.4 1.7 - 7.7 K/uL   Lymphocytes Relative 33 %   Lymphs Abs 2.4 0.7 - 4.0 K/uL   Monocytes Relative 4 %   Monocytes Absolute 0.3 0.1 - 1.0 K/uL   Eosinophils Relative 3 %   Eosinophils Absolute 0.2 0.0 - 0.7 K/uL   Basophils Relative 0 %   Basophils Absolute 0.0 0.0 - 0.1 K/uL   Basic metabolic panel     Status: Abnormal   Collection Time: 04/08/17 12:10 PM  Result Value Ref Range   Sodium 137 135 - 145 mmol/L   Potassium 4.8 3.5 - 5.1 mmol/L   Chloride 96 (L) 101 - 111 mmol/L   CO2 23 22 - 32 mmol/L   Glucose, Bld 132 (H) 65 - 99 mg/dL   BUN 77 (H) 6 - 20 mg/dL   Creatinine, Ser 14.31 (H) 0.61 - 1.24 mg/dL   Calcium 9.4 8.9 - 10.3 mg/dL   GFR calc non Af Amer 3 (L) >60 mL/min   GFR calc Af Amer 4 (L) >60 mL/min    Comment: (NOTE) The eGFR has been calculated using the CKD EPI equation. This calculation has not been validated in all clinical situations. eGFR's persistently <60 mL/min signify possible Chronic Kidney Disease.    Anion gap 18 (H) 5 - 15  PSA     Status: None   Collection Time: 04/08/17 12:10 PM  Result Value Ref Range   Prostatic Specific Antigen 2.45 0.00 - 4.00 ng/mL    Comment: (NOTE) While PSA levels of <=4.0 ng/ml are reported as reference range, some men with levels below 4.0 ng/ml can have prostate cancer and many men with PSA above 4.0 ng/ml do not have prostate cancer.  Other tests such as free PSA, age specific reference ranges, PSA velocity and PSA doubling time may be helpful especially in men less than 102 years old. Performed at [facility], [place] [address] [place], [place] [address]   Testosterone     Status: None   Collection Time: 04/08/17 12:10 PM  Result Value Ref Range   Testosterone 333 264 - 916 ng/dL    Comment: (NOTE) Adult male reference interval is based on a population of healthy nonobese males (BMI <30) between 44 and 37 years old. [place], [place] [id number]. PMID: [id number]. Performed At: [facility] [place], [name] [id number] Rush Farmer MD [id number]   Vitamin B12     Status: None   Collection Time: 04/08/17 12:10 PM  Result Value Ref Range   Vitamin B-12 383 180 - 914 pg/mL    Comment: (NOTE) This assay is not validated for testing neonatal  or myeloproliferative syndrome specimens for Vitamin B12 levels. Performed at [facility], [place] [address], [place], [name] [address]   Iron and TIBC     Status: Abnormal   Collection Time: 04/08/17 12:10 PM  Result Value Ref Range   Iron 86 45 - 182 ug/dL   TIBC 234 (L) 250 - 450 ug/dL   Saturation Ratios 37 17.9 - 39.5 %  UIBC 148 ug/dL    Comment: Performed at [facility], [place] [address], [place], [place] [address]  Ferritin     Status: Abnormal   Collection Time: 04/08/17 12:10 PM  Result Value Ref Range   Ferritin 1,239 (H) 24 - 336 ng/mL    Comment: Performed at [place] [address] [place], [place] [address]  Folate     Status: None   Collection Time: 04/08/17 12:11 PM  Result Value Ref Range   Folate 15.1 >5.9 ng/mL    Comment: Performed at [place] [address], [place], [place] [address]  POCT INR     Status: None   Collection Time: 04/13/17 12:00 AM  Result Value Ref Range   INR 4.3   POCT INR     Status: None   Collection Time: 04/20/17 12:00 AM  Result Value Ref Range   INR 4.2   POCT INR     Status: None   Collection Time: 04/26/17 12:00 AM  Result Value Ref Range   INR 2.5   POCT INR     Status: None   Collection Time: 05/04/17 12:00 AM  Result Value Ref Range   INR 3.9   CBC with Differential     Status: Abnormal   Collection Time: 05/06/17  3:22 PM  Result Value Ref Range   WBC 8.3 4.0 - 10.5 K/uL   RBC 3.25 (L) 4.22 - 5.81 MIL/uL   Hemoglobin 10.6 (L) 13.0 - 17.0 g/dL   HCT 33.0 (L) 39.0 - 52.0 %   MCV 101.5 (H) 78.0 - 100.0 fL   MCH 32.6 26.0 - 34.0 pg   MCHC 32.1 30.0 - 36.0 g/dL   RDW 15.8 (H) 11.5 - 15.5 %   Platelets 149 (L) 150 - 400 K/uL   Neutrophils Relative % 52 %   Neutro Abs 4.3 1.7 - 7.7 K/uL   Lymphocytes Relative 38 %   Lymphs Abs 3.1 0.7 - 4.0 K/uL   Monocytes Relative 8 %   Monocytes Absolute 0.7 0.1 - 1.0 K/uL   Eosinophils Relative 2 %   Eosinophils Absolute 0.2 0.0 - 0.7 K/uL    Basophils Relative 0 %   Basophils Absolute 0.0 0.0 - 0.1 K/uL  Comprehensive metabolic panel     Status: Abnormal   Collection Time: 05/06/17  3:22 PM  Result Value Ref Range   Sodium 135 135 - 145 mmol/L   Potassium 4.8 3.5 - 5.1 mmol/L   Chloride 96 (L) 101 - 111 mmol/L   CO2 19 (L) 22 - 32 mmol/L   Glucose, Bld 89 65 - 99 mg/dL   BUN 95 (H) 6 - 20 mg/dL   Creatinine, Ser 14.76 (H) 0.61 - 1.24 mg/dL   Calcium 8.8 (L) 8.9 - 10.3 mg/dL   Total Protein 7.2 6.5 - 8.1 g/dL   Albumin 3.0 (L) 3.5 - 5.0 g/dL   AST 17 15 - 41 U/L   ALT 15 (L) 17 - 63 U/L   Alkaline Phosphatase 131 (H) 38 - 126 U/L   Total Bilirubin 0.6 0.3 - 1.2 mg/dL   GFR calc non Af Amer 3 (L) >60 mL/min   GFR calc Af Amer 4 (L) >60 mL/min    Comment: (NOTE) The eGFR has been calculated using the CKD EPI equation. This calculation has not been validated in all clinical situations. eGFR's persistently <60 mL/min signify possible Chronic Kidney Disease.    Anion gap 20 (  H) 5 - 15  PSA     Status: None   Collection Time: 05/06/17  3:22 PM  Result Value Ref Range   Prostatic Specific Antigen 3.12 0.00 - 4.00 ng/mL    Comment: (NOTE) While PSA levels of <=4.0 ng/ml are reported as reference range, some men with levels below 4.0 ng/ml can have prostate cancer and many men with PSA above 4.0 ng/ml do not have prostate cancer.  Other tests such as free PSA, age specific reference ranges, PSA velocity and PSA doubling time may be helpful especially in men less than 71 years old. Performed at [facility], [place] [address], [place], [place] [address]   Testosterone     Status: Abnormal   Collection Time: 05/06/17  3:22 PM  Result Value Ref Range   Testosterone 200 (L) 264 - 916 ng/dL    Comment: (NOTE) Adult male reference interval is based on a population of healthy nonobese males (BMI <30) between 45 and 80 years old. [place], [place] [phone]. PMID: [id number]. Performed At: [facility] [place], [name] [id number] Rush Farmer MD [id number]   POCT INR     Status: None   Collection Time: 05/11/17 12:00 AM  Result Value Ref Range   INR 6.3   POCT INR     Status: None   Collection Time: 05/18/17 12:00 AM  Result Value Ref Range   INR 2.3   POCT INR     Status: None   Collection Time: 05/25/17 12:00 AM  Result Value Ref Range   INR 2.4   POCT INR     Status: None   Collection Time: 06/01/17 12:00 AM  Result Value Ref Range   INR 3.1   POCT INR     Status: None   Collection Time: 06/08/17 12:00 AM  Result Value Ref Range   INR 2.0     Radiology Dg Knee Complete 4 Views Left  Result Date: 05/14/2017 CLINICAL DATA:  Bilateral knee pain EXAM: LEFT KNEE - COMPLETE 4+ VIEW COMPARISON:  None. FINDINGS: Chondrocalcinosis, best seen in the medial compartment. Slight joint space narrowing in the medial and lateral compartments. No acute bony abnormality. Specifically, no fracture, subluxation, or dislocation. No joint effusion. Extensive vascular calcifications. IMPRESSION: Chondrocalcinosis. Early joint space narrowing. No acute bony abnormality. Electronically Signed   By: Rolm Baptise M.D.   On: 05/14/2017 11:01   Dg Knee Complete 4 Views Right  Result Date: 05/14/2017 CLINICAL DATA:  Bilateral knee pain EXAM: RIGHT KNEE - COMPLETE 4+ VIEW COMPARISON:  None. FINDINGS: Chondrocalcinosis. Mild joint space narrowing. No significant spurring. No joint effusion. No acute bony abnormality. Specifically, no fracture, subluxation, or dislocation. Extensive vascular calcifications. IMPRESSION: Chondrocalcinosis with early joint space narrowing. No acute bony abnormality. Electronically Signed   By: Rolm Baptise M.D.   On: 05/14/2017 11:01     Assessment/Plan Essential hypertension blood pressure control important in reducing the progression of atherosclerotic disease. On appropriate oral medications.   CAD (coronary artery disease) Status post  bypass and doing well.   AV fistula ([place]) This is markedly aneurysmal and had some recent pain.  As such, I think it is reasonable to consider a duplex for full evaluation.  This will be done at his convenience.  ESRD on dialysis [facility]) He is now doing peritoneal dialysis which is working quite well.  No issues with that current catheter.  He does have a long-standing left arm AV fistula which has been  present for about 20 years. This is markedly aneurysmal and had some recent pain.  As such, I think it is reasonable to consider a duplex for full evaluation.  This will be done at his convenience.    Leotis Pain, MD  06/09/2017 4:02 PM    This note was created with Dragon medical transcription system.  Any errors from dictation are purely unintentional

## 2017-06-09 NOTE — Patient Instructions (Signed)
Vascular Access for Hemodialysis A vascular access is a connection between two blood vessels that allows blood to be easily removed from the body and returned to the body during hemodialysis. Hemodialysis is a procedure in which a machine outside of the body filters the blood. There are three types of vascular accesses:  Arteriovenous fistula. This is a connection between an artery and a vein (usually in the arm) that is made by sewing them together. Blood in the artery flows directly into the vein, causing it to get larger over time. This makes it easier for the vein to be used for hemodialysis. An arteriovenous fistula takes 1-6 months to develop after surgery.  Arteriovenous graft. This is a connection between an artery and a vein in the arm that is made with a tube. An arteriovenous graft can be used within 2-3 weeks of surgery.  Venous catheter. This is a thin, flexible tube that is placed in a large vein (usually in the neck, chest, or groin). A venous catheter for hemodialysis contains two tubes that come out of the skin. A venous catheter can be used right away. It is usually used as a temporary access if you need hemodialysis before a fistula or graft has developed. It may also be used as a permanent access if a fistula or graft cannot be created.  Which type of access is best for me? The type of access that is best for you depends on the size and strength of your veins. A fistula is usually the preferred type of access. It can last several years and is less likely than the other types of accesses to become infected or to cause blood clots within a blood vessel (thrombosis). However, a fistula is not an option for everyone. If your veins are not the right size, a graft may be used instead. Grafts require you to have strong veins. If your veins are not strong enough for a graft, a catheter may be used. Catheters are more likely than fistulas and grafts to become infected or to have  thrombosis. Sometimes, only one type of access is an option. Your health care provider will help you determine which type of access is best for you. How is a vascular access used? The way the access is used depends on the type of access:  If the access is a fistula or graft, two needles are inserted through the skin into the access before each hemodialysis session. Blood leaves the body through one of the needles and travels through a tube to the hemodialysis machine (dialyzer). It then flows through another tube and returns to the body through the second needle.  If the access is a catheter, one tube is connected directly to the tube that leads to the dialyzer and the other is connected to a tube that leads away from the dialyzer. Blood leaves the body through one tube and returns to the body through the other.  What kind of problems can occur with vascular accesses?  Blood clots within a blood vessel (thrombosis). Thrombosis can lead to a narrowing of a blood vessel or tube (stenosis). If thrombosis occurs frequently, another access site may be created as a backup.  Infection. These problems are most likely to occur with a venous catheter and least likely to occur with an arteriovenous fistula. How do I care for my vascular access? Wear a medical alert bracelet. This tells health care providers that you are a dialysis patient in the case of an emergency and   allows them to care for your veins appropriately. If you have a graft or fistula:  A "bruit" is a noise that is heard with a stethoscope and a "thrill" is a vibration felt over the graft or fistula. The presence of the bruit and thrill indicates that the access is working. You will be taught to feel for the thrill each day. If this is not felt, the access may be clotted. Call your health care provider.  You may use the arm where your vascular access is located freely after the site heals. Keep the following in mind: ? Avoid pressure on the  arm. ? Avoid lifting heavy objects with the arm. ? Avoid sleeping on the arm. ? Avoid wearing tight-sleeved shirts or jewelry around the graft or fistula.  Do not allow blood pressure monitoring or needle punctures on the side where the graft or fistula is located.  With permission from your health care provider, you may do exercises to help with blood flow through a fistula. These exercises involve squeezing a rubber ball or other soft objects as instructed.  Contact a health care provider if:  Chills develop.  You have an oral temperature above 102 F (38.9 C).  Swelling around the graft or fistula gets worse.  New pain develops.  Pus or other fluid (drainage) is seen at the vascular access site.  Skin redness or red streaking is seen on the skin around, above, or below the vascular access. Get help right away if:  Pain, numbness, or an unusual pale skin color develops in the hand on the side of your fistula.  Dizziness or weakness develops that you have not had before.  The vascular access has bleeding that cannot be easily controlled. This information is not intended to replace advice given to you by your health care provider. Make sure you discuss any questions you have with your health care provider. Document Released: 06/14/2002 Document Revised: 08/30/2015 Document Reviewed: 08/10/2012 Elsevier Interactive Patient Education  2017 [employer]  

## 2017-06-09 NOTE — Telephone Encounter (Signed)
Chrys Racer already spoke with the patient regarding this.

## 2017-06-09 NOTE — Assessment & Plan Note (Signed)
This is markedly aneurysmal and had some recent pain.  As such, I think it is reasonable to consider a duplex for full evaluation.  This will be done at his convenience.

## 2017-06-10 DIAGNOSIS — D631 Anemia in chronic kidney disease: Secondary | ICD-10-CM | POA: Diagnosis not present

## 2017-06-10 DIAGNOSIS — N2581 Secondary hyperparathyroidism of renal origin: Secondary | ICD-10-CM | POA: Diagnosis not present

## 2017-06-10 DIAGNOSIS — N2589 Other disorders resulting from impaired renal tubular function: Secondary | ICD-10-CM | POA: Diagnosis not present

## 2017-06-10 DIAGNOSIS — D509 Iron deficiency anemia, unspecified: Secondary | ICD-10-CM | POA: Diagnosis not present

## 2017-06-10 DIAGNOSIS — N186 End stage renal disease: Secondary | ICD-10-CM | POA: Diagnosis not present

## 2017-06-11 DIAGNOSIS — N2581 Secondary hyperparathyroidism of renal origin: Secondary | ICD-10-CM | POA: Diagnosis not present

## 2017-06-11 DIAGNOSIS — N2589 Other disorders resulting from impaired renal tubular function: Secondary | ICD-10-CM | POA: Diagnosis not present

## 2017-06-11 DIAGNOSIS — D509 Iron deficiency anemia, unspecified: Secondary | ICD-10-CM | POA: Diagnosis not present

## 2017-06-11 DIAGNOSIS — D631 Anemia in chronic kidney disease: Secondary | ICD-10-CM | POA: Diagnosis not present

## 2017-06-11 DIAGNOSIS — N186 End stage renal disease: Secondary | ICD-10-CM | POA: Diagnosis not present

## 2017-06-12 DIAGNOSIS — D509 Iron deficiency anemia, unspecified: Secondary | ICD-10-CM | POA: Diagnosis not present

## 2017-06-12 DIAGNOSIS — N186 End stage renal disease: Secondary | ICD-10-CM | POA: Diagnosis not present

## 2017-06-12 DIAGNOSIS — N2589 Other disorders resulting from impaired renal tubular function: Secondary | ICD-10-CM | POA: Diagnosis not present

## 2017-06-12 DIAGNOSIS — N2581 Secondary hyperparathyroidism of renal origin: Secondary | ICD-10-CM | POA: Diagnosis not present

## 2017-06-12 DIAGNOSIS — D631 Anemia in chronic kidney disease: Secondary | ICD-10-CM | POA: Diagnosis not present

## 2017-06-13 DIAGNOSIS — N2581 Secondary hyperparathyroidism of renal origin: Secondary | ICD-10-CM | POA: Diagnosis not present

## 2017-06-13 DIAGNOSIS — D509 Iron deficiency anemia, unspecified: Secondary | ICD-10-CM | POA: Diagnosis not present

## 2017-06-13 DIAGNOSIS — N186 End stage renal disease: Secondary | ICD-10-CM | POA: Diagnosis not present

## 2017-06-13 DIAGNOSIS — N2589 Other disorders resulting from impaired renal tubular function: Secondary | ICD-10-CM | POA: Diagnosis not present

## 2017-06-13 DIAGNOSIS — D631 Anemia in chronic kidney disease: Secondary | ICD-10-CM | POA: Diagnosis not present

## 2017-06-14 DIAGNOSIS — N2589 Other disorders resulting from impaired renal tubular function: Secondary | ICD-10-CM | POA: Diagnosis not present

## 2017-06-14 DIAGNOSIS — N2581 Secondary hyperparathyroidism of renal origin: Secondary | ICD-10-CM | POA: Diagnosis not present

## 2017-06-14 DIAGNOSIS — N186 End stage renal disease: Secondary | ICD-10-CM | POA: Diagnosis not present

## 2017-06-14 DIAGNOSIS — D631 Anemia in chronic kidney disease: Secondary | ICD-10-CM | POA: Diagnosis not present

## 2017-06-14 DIAGNOSIS — D509 Iron deficiency anemia, unspecified: Secondary | ICD-10-CM | POA: Diagnosis not present

## 2017-06-15 ENCOUNTER — Ambulatory Visit (INDEPENDENT_AMBULATORY_CARE_PROVIDER_SITE_OTHER): Payer: Medicare Other | Admitting: Pharmacist

## 2017-06-15 DIAGNOSIS — I4819 Other persistent atrial fibrillation: Secondary | ICD-10-CM

## 2017-06-15 DIAGNOSIS — I481 Persistent atrial fibrillation: Secondary | ICD-10-CM | POA: Diagnosis not present

## 2017-06-15 DIAGNOSIS — Z7901 Long term (current) use of anticoagulants: Secondary | ICD-10-CM | POA: Diagnosis not present

## 2017-06-15 DIAGNOSIS — D509 Iron deficiency anemia, unspecified: Secondary | ICD-10-CM | POA: Diagnosis not present

## 2017-06-15 DIAGNOSIS — N186 End stage renal disease: Secondary | ICD-10-CM | POA: Diagnosis not present

## 2017-06-15 DIAGNOSIS — Z952 Presence of prosthetic heart valve: Secondary | ICD-10-CM | POA: Diagnosis not present

## 2017-06-15 DIAGNOSIS — D631 Anemia in chronic kidney disease: Secondary | ICD-10-CM | POA: Diagnosis not present

## 2017-06-15 DIAGNOSIS — N2589 Other disorders resulting from impaired renal tubular function: Secondary | ICD-10-CM | POA: Diagnosis not present

## 2017-06-15 DIAGNOSIS — N2581 Secondary hyperparathyroidism of renal origin: Secondary | ICD-10-CM | POA: Diagnosis not present

## 2017-06-15 LAB — POCT INR: INR: 2.1

## 2017-06-15 NOTE — Progress Notes (Signed)
I have reviewed the above note and agree. I was available to the pharmacist for consultation.  Karmela Bram, MD  

## 2017-06-16 DIAGNOSIS — N2581 Secondary hyperparathyroidism of renal origin: Secondary | ICD-10-CM | POA: Diagnosis not present

## 2017-06-16 DIAGNOSIS — I482 Chronic atrial fibrillation: Secondary | ICD-10-CM | POA: Diagnosis not present

## 2017-06-16 DIAGNOSIS — D509 Iron deficiency anemia, unspecified: Secondary | ICD-10-CM | POA: Diagnosis not present

## 2017-06-16 DIAGNOSIS — N2589 Other disorders resulting from impaired renal tubular function: Secondary | ICD-10-CM | POA: Diagnosis not present

## 2017-06-16 DIAGNOSIS — D631 Anemia in chronic kidney disease: Secondary | ICD-10-CM | POA: Diagnosis not present

## 2017-06-16 DIAGNOSIS — N186 End stage renal disease: Secondary | ICD-10-CM | POA: Diagnosis not present

## 2017-06-17 DIAGNOSIS — D631 Anemia in chronic kidney disease: Secondary | ICD-10-CM | POA: Diagnosis not present

## 2017-06-17 DIAGNOSIS — D509 Iron deficiency anemia, unspecified: Secondary | ICD-10-CM | POA: Diagnosis not present

## 2017-06-17 DIAGNOSIS — N2589 Other disorders resulting from impaired renal tubular function: Secondary | ICD-10-CM | POA: Diagnosis not present

## 2017-06-17 DIAGNOSIS — N2581 Secondary hyperparathyroidism of renal origin: Secondary | ICD-10-CM | POA: Diagnosis not present

## 2017-06-17 DIAGNOSIS — N186 End stage renal disease: Secondary | ICD-10-CM | POA: Diagnosis not present

## 2017-06-18 DIAGNOSIS — N2581 Secondary hyperparathyroidism of renal origin: Secondary | ICD-10-CM | POA: Diagnosis not present

## 2017-06-18 DIAGNOSIS — D509 Iron deficiency anemia, unspecified: Secondary | ICD-10-CM | POA: Diagnosis not present

## 2017-06-18 DIAGNOSIS — N2589 Other disorders resulting from impaired renal tubular function: Secondary | ICD-10-CM | POA: Diagnosis not present

## 2017-06-18 DIAGNOSIS — N186 End stage renal disease: Secondary | ICD-10-CM | POA: Diagnosis not present

## 2017-06-18 DIAGNOSIS — D631 Anemia in chronic kidney disease: Secondary | ICD-10-CM | POA: Diagnosis not present

## 2017-06-19 DIAGNOSIS — N2589 Other disorders resulting from impaired renal tubular function: Secondary | ICD-10-CM | POA: Diagnosis not present

## 2017-06-19 DIAGNOSIS — D631 Anemia in chronic kidney disease: Secondary | ICD-10-CM | POA: Diagnosis not present

## 2017-06-19 DIAGNOSIS — N2581 Secondary hyperparathyroidism of renal origin: Secondary | ICD-10-CM | POA: Diagnosis not present

## 2017-06-19 DIAGNOSIS — N186 End stage renal disease: Secondary | ICD-10-CM | POA: Diagnosis not present

## 2017-06-19 DIAGNOSIS — D509 Iron deficiency anemia, unspecified: Secondary | ICD-10-CM | POA: Diagnosis not present

## 2017-06-20 DIAGNOSIS — D631 Anemia in chronic kidney disease: Secondary | ICD-10-CM | POA: Diagnosis not present

## 2017-06-20 DIAGNOSIS — N186 End stage renal disease: Secondary | ICD-10-CM | POA: Diagnosis not present

## 2017-06-20 DIAGNOSIS — N2589 Other disorders resulting from impaired renal tubular function: Secondary | ICD-10-CM | POA: Diagnosis not present

## 2017-06-20 DIAGNOSIS — N2581 Secondary hyperparathyroidism of renal origin: Secondary | ICD-10-CM | POA: Diagnosis not present

## 2017-06-20 DIAGNOSIS — D509 Iron deficiency anemia, unspecified: Secondary | ICD-10-CM | POA: Diagnosis not present

## 2017-06-21 DIAGNOSIS — N2589 Other disorders resulting from impaired renal tubular function: Secondary | ICD-10-CM | POA: Diagnosis not present

## 2017-06-21 DIAGNOSIS — N186 End stage renal disease: Secondary | ICD-10-CM | POA: Diagnosis not present

## 2017-06-21 DIAGNOSIS — D631 Anemia in chronic kidney disease: Secondary | ICD-10-CM | POA: Diagnosis not present

## 2017-06-21 DIAGNOSIS — D509 Iron deficiency anemia, unspecified: Secondary | ICD-10-CM | POA: Diagnosis not present

## 2017-06-21 DIAGNOSIS — N2581 Secondary hyperparathyroidism of renal origin: Secondary | ICD-10-CM | POA: Diagnosis not present

## 2017-06-22 ENCOUNTER — Ambulatory Visit (INDEPENDENT_AMBULATORY_CARE_PROVIDER_SITE_OTHER): Payer: Medicare Other | Admitting: Pharmacist

## 2017-06-22 DIAGNOSIS — I481 Persistent atrial fibrillation: Secondary | ICD-10-CM

## 2017-06-22 DIAGNOSIS — N2581 Secondary hyperparathyroidism of renal origin: Secondary | ICD-10-CM | POA: Diagnosis not present

## 2017-06-22 DIAGNOSIS — N2589 Other disorders resulting from impaired renal tubular function: Secondary | ICD-10-CM | POA: Diagnosis not present

## 2017-06-22 DIAGNOSIS — N186 End stage renal disease: Secondary | ICD-10-CM | POA: Diagnosis not present

## 2017-06-22 DIAGNOSIS — Z7901 Long term (current) use of anticoagulants: Secondary | ICD-10-CM

## 2017-06-22 DIAGNOSIS — Z952 Presence of prosthetic heart valve: Secondary | ICD-10-CM | POA: Diagnosis not present

## 2017-06-22 DIAGNOSIS — I4819 Other persistent atrial fibrillation: Secondary | ICD-10-CM

## 2017-06-22 DIAGNOSIS — D509 Iron deficiency anemia, unspecified: Secondary | ICD-10-CM | POA: Diagnosis not present

## 2017-06-22 DIAGNOSIS — D631 Anemia in chronic kidney disease: Secondary | ICD-10-CM | POA: Diagnosis not present

## 2017-06-22 LAB — POCT INR: INR: 2.8

## 2017-06-23 DIAGNOSIS — D631 Anemia in chronic kidney disease: Secondary | ICD-10-CM | POA: Diagnosis not present

## 2017-06-23 DIAGNOSIS — N2589 Other disorders resulting from impaired renal tubular function: Secondary | ICD-10-CM | POA: Diagnosis not present

## 2017-06-23 DIAGNOSIS — N186 End stage renal disease: Secondary | ICD-10-CM | POA: Diagnosis not present

## 2017-06-23 DIAGNOSIS — N2581 Secondary hyperparathyroidism of renal origin: Secondary | ICD-10-CM | POA: Diagnosis not present

## 2017-06-23 DIAGNOSIS — D509 Iron deficiency anemia, unspecified: Secondary | ICD-10-CM | POA: Diagnosis not present

## 2017-06-24 ENCOUNTER — Encounter (INDEPENDENT_AMBULATORY_CARE_PROVIDER_SITE_OTHER): Payer: Self-pay | Admitting: Vascular Surgery

## 2017-06-24 ENCOUNTER — Telehealth: Payer: Self-pay | Admitting: Family Medicine

## 2017-06-24 ENCOUNTER — Ambulatory Visit (INDEPENDENT_AMBULATORY_CARE_PROVIDER_SITE_OTHER): Payer: Medicare Other

## 2017-06-24 ENCOUNTER — Ambulatory Visit (INDEPENDENT_AMBULATORY_CARE_PROVIDER_SITE_OTHER): Payer: Medicare Other | Admitting: Vascular Surgery

## 2017-06-24 VITALS — BP 127/62 | HR 92 | Resp 16 | Wt 188.0 lb

## 2017-06-24 DIAGNOSIS — I2510 Atherosclerotic heart disease of native coronary artery without angina pectoris: Secondary | ICD-10-CM

## 2017-06-24 DIAGNOSIS — D631 Anemia in chronic kidney disease: Secondary | ICD-10-CM | POA: Diagnosis not present

## 2017-06-24 DIAGNOSIS — Z992 Dependence on renal dialysis: Secondary | ICD-10-CM | POA: Diagnosis not present

## 2017-06-24 DIAGNOSIS — N186 End stage renal disease: Secondary | ICD-10-CM | POA: Diagnosis not present

## 2017-06-24 DIAGNOSIS — I770 Arteriovenous fistula, acquired: Secondary | ICD-10-CM | POA: Diagnosis not present

## 2017-06-24 DIAGNOSIS — D509 Iron deficiency anemia, unspecified: Secondary | ICD-10-CM | POA: Diagnosis not present

## 2017-06-24 DIAGNOSIS — N2581 Secondary hyperparathyroidism of renal origin: Secondary | ICD-10-CM | POA: Diagnosis not present

## 2017-06-24 DIAGNOSIS — N2589 Other disorders resulting from impaired renal tubular function: Secondary | ICD-10-CM | POA: Diagnosis not present

## 2017-06-24 DIAGNOSIS — N189 Chronic kidney disease, unspecified: Secondary | ICD-10-CM

## 2017-06-24 NOTE — Telephone Encounter (Signed)
scheduled

## 2017-06-24 NOTE — Telephone Encounter (Signed)
Please advise 

## 2017-06-24 NOTE — Progress Notes (Signed)
INR at goal of 2.8. I have reviewed the above note and agree. I was available to the pharmacist for consultation.  Tommi Rumps, MD

## 2017-06-24 NOTE — Telephone Encounter (Signed)
Copied from [place] [phone]. Topic: Quick Communication - Rx Refill/Question >> Jun 24, 2017  9:46 AM Margot Ables wrote: Medication: prednisone (not on med list) - pt states he is having a gout flare up and was advised to call in when this happens for meds to be sent in - woke up this morning with flare up Has the patient contacted their pharmacy? No. No refills (Agent: If no, request that the patient contact the pharmacy for the refill.) Preferred Pharmacy (with phone number or street name): [id number] - , [name] - [address] [phone] (Phone) [phone] (Fax)

## 2017-06-24 NOTE — Telephone Encounter (Signed)
I did not advise this previously. He will need to be seen. Please offer him an appointment. Thanks.

## 2017-06-24 NOTE — Progress Notes (Signed)
Subjective:    Patient ID: Jeremy [name], male    DOB: November 19, 1955, 62 y.o.   MRN: [id number] Chief Complaint  Patient presents with  . Follow-up    pt conv HDA   Patient last seen on June 09, 2017 for evaluation of left hand pain.  The patient was treated for gout and his hand pain has resolved.  The patient has been using his peritoneal dialysis catheter without issue.  The patient had not undergone a dialysis access duplex in some time and was brought back today to undergo this.  The patient has a total flow volume of 2054.  Aneurysmal dilatation is noted.  The patient denies any left hand or upper extremity pain.  The patient denies any bleeding from the fistula site.  The patient denies any fever, nausea or vomiting.   Review of Systems  Constitutional: Negative.   HENT: Negative.   Eyes: Negative.   Respiratory: Negative.   Cardiovascular: Negative.   Gastrointestinal: Negative.   Endocrine: Negative.   Genitourinary:       ESRD  Musculoskeletal: Negative.   Skin: Negative.   Allergic/Immunologic: Negative.   Neurological: Negative.   Hematological: Negative.   Psychiatric/Behavioral: Negative.       Objective:   Physical Exam  Constitutional: He is oriented to person, place, and time. He appears well-developed and well-nourished. No distress.  HENT:  Head: Normocephalic and atraumatic.  Eyes: Conjunctivae are normal. Pupils are equal, round, and reactive to light.  Neck: Normal range of motion.  Cardiovascular: Normal rate, regular rhythm, normal heart sounds and intact distal pulses.  Pulses:      Radial pulses are 2+ on the right side, and 2+ on the left side.  Left upper extremity dialysis access: Aneurysmal however skin is intact.  There is no skin threatening.  Good bruit and thrill.  Pulmonary/Chest: Effort normal and breath sounds normal.  Musculoskeletal: Normal range of motion. He exhibits no edema.  Neurological: He is alert and oriented to person, place, and  time.  Skin: He is not diaphoretic.  Psychiatric: He has a normal mood and affect. His behavior is normal. Judgment and thought content normal.  Vitals reviewed.  BP 127/62 (BP Location: Right Arm)   Pulse 92   Resp 16   Wt 188 lb (85.3 kg)   BMI 27.76 kg/m   Past Medical History:  Diagnosis Date  . 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 Arthritis   . Chronic anticoagulation 06/2012  . Dialysis patient [facility]) 01 01 2008  . Dysrhythmia    A Fib  . Hx of colonoscopy    2009 by Dr. Laural Golden. Normal except for small submucosal lipoma. No evidence of polyps or colitis.  . Hyperlipidemia   . Hypertension   . Hypogonadism in male 07/25/2015  . 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  Marland Kitchen OSA (obstructive sleep apnea)    No CPAP  . Renal cell carcinoma ([place]) 08/04/2016   Social History   Socioeconomic History  . Marital status: Divorced    Spouse name: Not on file  . Number of children: Not on file  . Years of education: Not on file  . Highest education level: Not on file  Social Needs  . Financial resource strain: Not on file  . Food insecurity - worry: Not  on file  . Food insecurity - inability: Not on file  . Transportation needs - medical: Not on file  . Transportation needs - non-medical: Not on file  Occupational History  . 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 Smoking status: Never Smoker  . Smokeless tobacco: Never Used  Substance and Sexual Activity  . Alcohol use: No  . Drug use: No  . Sexual activity: Not Currently    Partners: Female  Other Topics Concern  . Not on file  Social History Narrative   Single   1 daughter 31    Past Surgical History:  Procedure Laterality Date  . 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 CAPD INSERTION N/A 02/19/2017   Procedure: LAPAROSCOPIC INSERTION CONTINUOUS AMBULATORY PERITONEAL DIALYSIS  (CAPD) CATHETER;  Surgeon: Algernon Huxley, MD;  Location: [facility] ORS;  Service: General;  Laterality: N/A;  . CORONARY ANGIOPLASTY WITH STENT PLACEMENT    . DG AV DIALYSIS  SHUNT ACCESS EXIST*L* OR     left arm  . EPIGASTRIC HERNIA REPAIR N/A 02/19/2017   Procedure: HERNIA REPAIR EPIGASTRIC ADULT;  Surgeon: Robert Bellow, MD;  Location: [facility] ORS;  Service: General;  Laterality: N/A;  . EYE SURGERY     bilateral cataract  . HERNIA REPAIR     UMBILICAL HERNIA  . HERNIA REPAIR  02/19/2017  . INNER EAR SURGERY     20 years ago  . KIDNEY TRANSPLANT    . Peritoneal Dialysis access placement  02/19/2017  . TEE WITHOUT CARDIOVERSION N/A 07/21/2016   Procedure: TRANSESOPHAGEAL ECHOCARDIOGRAM (TEE);  Surgeon: Wellington Hampshire, MD;  Location: [facility] ORS;  Service: Cardiovascular;  Laterality: N/A;   Family History  Problem Relation Age of Onset  . Cancer Mother        pancreatic  . Heart disease Father   . Diabetes Father    Allergies  Allergen Reactions  . Statins     Other reaction(s): Arthralgias (intolerance), Myalgias (intolerance)  . Sertraline Rash      Assessment & Plan:  Patient last seen on June 09, 2017 for evaluation of left hand pain.  The patient was treated for gout and his hand pain has resolved.  The patient has been using his peritoneal dialysis catheter without issue.  The patient had not undergone a dialysis access duplex in some time and was brought back today to undergo this.  The patient has a total flow volume of 2054.  Aneurysmal dilatation is noted.  The patient denies any left hand or upper extremity pain.  The patient denies any bleeding from the fistula site.  The patient denies any fever, nausea or vomiting.  1. ESRD on dialysis ([place]) - Stable Patient presents to undergo a left upper extremity HDA The patient is currently  using his peritoneal dialysis catheter without issue however has not undergone an HDA in some time The patient had a recent attack of gout to the left hand which is now resolved which is also resolved his discomfort The patient denies any pain along his fistula or bleeding from his fistula Recommend HDA every 6 months even though the patient is not using the fistula to monitor his aneurysms which are quite large however at this time there is no skin threatening or pain associated with them  - VAS US DUPLEX DIALYSIS ACCESS (AVF,AVG); Future  2. Anemia in chronic  kidney disease, unspecified CKD stage - Stable Patient presents asymptomatically today This is followed by the patient's nephrologist and primary care physician  Current Outpatient Medications on File Prior to Visit  Medication Sig Dispense Refill  . acetaminophen (TYLENOL) 500 MG tablet Take 1,000 mg 2 (two) times daily as needed by mouth for moderate pain.    Marland Kitchen allopurinol (ZYLOPRIM) 100 MG tablet Take 2 tablets (200 mg total) by mouth daily. 180 tablet 1  . Cholecalciferol (VITAMIN D3) 1000 units CAPS Take 2,000 Units every evening by mouth.    . cinacalcet (SENSIPAR) 60 MG tablet Take 60 mg by mouth daily. At dialysis    . Dextrose-Fructose-Sod Citrate (NAUZENE PO) Take 1-2 tablets 2 (two) times daily as needed by mouth (nausea).    . dicyclomine (BENTYL) 10 MG capsule Take 2 capsules (20 mg total) by mouth 3 (three) times daily before meals. 90 capsule 4  . DiphenhydrAMINE HCl, Sleep, 50 MG CAPS Take 50 mg at bedtime as needed by mouth (sleep).    . Evolocumab (REPATHA SURECLICK) 466 MG/ML SOAJ Inject 1 application into the skin every 14 (fourteen) days. 2 pen 5  . FLUoxetine (PROZAC) 10 MG capsule Take 1 capsule (10 mg total) by mouth daily. 30 capsule 3  . IRON PO Take by mouth.    . lanthanum (FOSRENOL) 1000 MG chewable tablet Chew 2,000 mg 3 (three) times daily after meals by mouth.     . lidocaine-prilocaine (EMLA) cream  Apply 1 application as needed topically (port access).    . metoprolol tartrate (LOPRESSOR) 100 MG tablet Take 1 tablet (100 mg total) by mouth 2 (two) times daily. 180 tablet 3  . nitroGLYCERIN (NITROSTAT) 0.4 MG SL tablet Place 1 tablet (0.4 mg total) under the tongue every 5 (five) minutes as needed for chest pain. 25 tablet 2  . Omega-3 Fatty Acids (FISH OIL) 1200 MG CAPS Take 1,200 mg 2 (two) times daily by mouth.    Marland Kitchen omeprazole (PRILOSEC) 20 MG capsule Take 1 capsule (20 mg total) by mouth daily. 90 capsule 1  . pravastatin (PRAVACHOL) 10 MG tablet Take one tablet by mouth on Monday, Wednesday, and friday 12 tablet 2  . rOPINIRole (REQUIP) 0.25 MG tablet Take 0.25 mg at bedtime as needed by mouth (restless legs).     . warfarin (COUMADIN) 1 MG tablet Take 1 tablet (1 mg total) by mouth daily. 180 tablet 1   No current facility-administered medications on file prior to visit.    There are no Patient Instructions on file for this visit. No Follow-up on file.  Dennisse Swader A Jecenia Leamer, PA-C

## 2017-06-25 ENCOUNTER — Encounter: Payer: Self-pay | Admitting: Family Medicine

## 2017-06-25 ENCOUNTER — Ambulatory Visit (INDEPENDENT_AMBULATORY_CARE_PROVIDER_SITE_OTHER): Payer: Medicare Other | Admitting: Family Medicine

## 2017-06-25 VITALS — BP 130/64 | HR 63 | Temp 98.3°F | Resp 16 | Wt 191.2 lb

## 2017-06-25 DIAGNOSIS — D509 Iron deficiency anemia, unspecified: Secondary | ICD-10-CM | POA: Diagnosis not present

## 2017-06-25 DIAGNOSIS — N2589 Other disorders resulting from impaired renal tubular function: Secondary | ICD-10-CM | POA: Diagnosis not present

## 2017-06-25 DIAGNOSIS — M109 Gout, unspecified: Secondary | ICD-10-CM

## 2017-06-25 DIAGNOSIS — N2581 Secondary hyperparathyroidism of renal origin: Secondary | ICD-10-CM | POA: Diagnosis not present

## 2017-06-25 DIAGNOSIS — N186 End stage renal disease: Secondary | ICD-10-CM | POA: Diagnosis not present

## 2017-06-25 DIAGNOSIS — D631 Anemia in chronic kidney disease: Secondary | ICD-10-CM | POA: Diagnosis not present

## 2017-06-25 DIAGNOSIS — I2510 Atherosclerotic heart disease of native coronary artery without angina pectoris: Secondary | ICD-10-CM

## 2017-06-25 MED ORDER — PREDNISONE 10 MG PO TABS: ORAL_TABLET | ORAL | 0 refills | Status: DC

## 2017-06-25 NOTE — Progress Notes (Signed)
Subjective:    Patient ID: Jeremy [name], male    DOB: 1955-09-27, 62 y.o.   MRN: [id number]  HPI  Mr. Poss is a 62 year old male who presents today swelling in his index fingers bilaterally that started 2 days ago. Pain is described as sharp pain that is worse with bending. No associated erythema or warmth. He denies fever, chills, or sweats today.  He reports that this was triggered after eating bacon and he knows to avoid this and usually closely watches his diet.  He is followed by rheumatology for arthritis. He has been treated with prednisone previously for osteoarthritis and gout. He has chronic joint pain in hips/knees.  He is on peritoneal dialysis and sees his nephrologist once monthly.  Treatment with one dose of a leftover prednisone that has provided limited benefit.  History of polyarticular flares, ESRD on dialysis, CAD, HTN, A-fib  He has weekly INR checks; no report of unusual bleeding/bruising. He is scheduled on 06/29/17 for next INR.  He reports having styes in his eyes bilaterally that he treated with an antibiotic ointment that his girlfriend had that provided excellent benefit. He denies any edema, erythema, pain, watery/itchy eyes, drainage, or change in vision today.   Review of Systems  Constitutional: Negative for chills, fatigue and fever.  Eyes: Negative for photophobia, pain, discharge, redness, itching and visual disturbance.  Respiratory: Negative for cough, shortness of breath and wheezing.   Cardiovascular: Negative for chest pain and palpitations.  Gastrointestinal: Negative for abdominal pain, blood in stool, constipation, diarrhea, nausea and vomiting.  Musculoskeletal: Negative for myalgias.       Pain in joint of index fingers   Skin: Negative for rash.  Neurological: Negative for dizziness and headaches.  Hematological: Does not bruise/bleed easily.   Past Medical History:  Diagnosis Date  . 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  Arthritis   . Chronic anticoagulation 06/2012  . Dialysis patient [facility]) 01 01 2008  . Dysrhythmia    A Fib  . Hx of colonoscopy    2009 by Dr. Laural Golden. Normal except for small submucosal lipoma. No evidence of polyps or colitis.  . Hyperlipidemia   . Hypertension   . Hypogonadism in male 07/25/2015  . 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  Marland Kitchen OSA (obstructive sleep apnea)    No CPAP  . Renal cell carcinoma ([place]) 08/04/2016     Social History   Socioeconomic History  . Marital status: Divorced    Spouse name: Not on file  . Number of children: Not on file  . Years of education: Not on file  . Highest education level: Not on file  Occupational History  . Occupation: courier    Comment: [place]  . Financial resource strain: Not on file  . Food insecurity:    Worry: Not on file    Inability: Not on file  . Transportation needs:    Medical: Not on file    Non-medical: Not on file  Tobacco Use  . Smoking status: Never Smoker  . Smokeless tobacco: Never Used  Substance and Sexual Activity  . Alcohol use: No  . Drug use: No  . Sexual activity: Not Currently    Partners: Female  Lifestyle  . Physical activity:    Days per  week: Not on file    Minutes per session: Not on file  . Stress: Not on file  Relationships  . Social connections:    Talks on phone: Not on file    Gets together: Not on file    Attends religious service: Not on file    Active member of club or organization: Not on file    Attends meetings of clubs or organizations: Not on file    Relationship status: Not on file  . Intimate partner violence:    Fear of current or ex partner: Not on file    Emotionally abused: Not on file    Physically abused: Not on file    Forced sexual activity: Not on file  Other  Topics Concern  . Not on file  Social History Narrative   Single   1 daughter 14     Past Surgical History:  Procedure Laterality Date  . AORTIC VALVE REPLACEMENT  3 /11 /2014   At [place]    . CAPD INSERTION N/A 02/19/2017   Procedure: LAPAROSCOPIC INSERTION CONTINUOUS AMBULATORY PERITONEAL DIALYSIS  (CAPD) CATHETER;  Surgeon: Algernon Huxley, MD;  Location: [facility] ORS;  Service: General;  Laterality: N/A;  . CORONARY ANGIOPLASTY WITH STENT PLACEMENT    . DG AV DIALYSIS  SHUNT ACCESS EXIST*L* OR     left arm  . EPIGASTRIC HERNIA REPAIR N/A 02/19/2017   Procedure: HERNIA REPAIR EPIGASTRIC ADULT;  Surgeon: Robert Bellow, MD;  Location: [facility] ORS;  Service: General;  Laterality: N/A;  . EYE SURGERY     bilateral cataract  . HERNIA REPAIR     UMBILICAL HERNIA  . HERNIA REPAIR  02/19/2017  . INNER EAR SURGERY     20 years ago  . KIDNEY TRANSPLANT    . Peritoneal Dialysis access placement  02/19/2017  . TEE WITHOUT CARDIOVERSION N/A 07/21/2016   Procedure: TRANSESOPHAGEAL ECHOCARDIOGRAM (TEE);  Surgeon: Wellington Hampshire, MD;  Location: [facility] ORS;  Service: Cardiovascular;  Laterality: N/A;    Family History  Problem Relation Age of Onset  . Cancer Mother        pancreatic  . Heart disease Father   . Diabetes Father     Allergies  Allergen Reactions  . Statins     Other reaction(s): Arthralgias (intolerance), Myalgias (intolerance)  . Sertraline Rash    Current Outpatient Medications on File Prior to Visit  Medication Sig Dispense Refill  . acetaminophen (TYLENOL) 500 MG tablet Take 1,000 mg 2 (two) times daily as needed by mouth for moderate pain.    Marland Kitchen allopurinol (ZYLOPRIM) 100 MG tablet Take 2 tablets (200 mg total) by mouth daily. 180 tablet 1  . Cholecalciferol (VITAMIN D3) 1000 units CAPS Take 2,000 Units every evening by mouth.    . cinacalcet (SENSIPAR) 60 MG tablet Take 60 mg by mouth daily. At dialysis    .  Dextrose-Fructose-Sod Citrate (NAUZENE PO) Take 1-2 tablets 2 (two) times daily as needed by mouth (nausea).    . dicyclomine (BENTYL) 10 MG capsule Take 2 capsules (20 mg total) by mouth 3 (three) times daily before meals. 90 capsule 4  . DiphenhydrAMINE HCl, Sleep, 50 MG CAPS Take 50 mg at bedtime as needed by mouth (sleep).    . Evolocumab (REPATHA SURECLICK) 809 MG/ML SOAJ Inject 1 application into the skin every 14 (fourteen) days. 2 pen 5  . FLUoxetine (PROZAC) 10 MG capsule Take 1 capsule (10 mg total)  by mouth daily. 30 capsule 3  . IRON PO Take by mouth.    . lanthanum (FOSRENOL) 1000 MG chewable tablet Chew 2,000 mg 3 (three) times daily after meals by mouth.     . lidocaine-prilocaine (EMLA) cream Apply 1 application as needed topically (port access).    . metoprolol tartrate (LOPRESSOR) 100 MG tablet Take 1 tablet (100 mg total) by mouth 2 (two) times daily. 180 tablet 3  . nitroGLYCERIN (NITROSTAT) 0.4 MG SL tablet Place 1 tablet (0.4 mg total) under the tongue every 5 (five) minutes as needed for chest pain. 25 tablet 2  . Omega-3 Fatty Acids (FISH OIL) 1200 MG CAPS Take 1,200 mg 2 (two) times daily by mouth.    Marland Kitchen omeprazole (PRILOSEC) 20 MG capsule Take 1 capsule (20 mg total) by mouth daily. 90 capsule 1  . pravastatin (PRAVACHOL) 10 MG tablet Take one tablet by mouth on Monday, Wednesday, and friday 12 tablet 2  . rOPINIRole (REQUIP) 0.25 MG tablet Take 0.25 mg at bedtime as needed by mouth (restless legs).     . warfarin (COUMADIN) 1 MG tablet Take 1 tablet (1 mg total) by mouth daily. 180 tablet 1   No current facility-administered medications on file prior to visit.     BP 130/64 (BP Location: Left Arm, Patient Position: Sitting, Cuff Size: Normal)   Pulse 63   Temp 98.3 F (36.8 C) (Oral)   Resp 16   Wt 191 lb 4 oz (86.8 kg)   SpO2 97%   BMI 28.24 kg/m       Objective:   Physical Exam  Constitutional: He is oriented to person, place, and time. He appears  well-developed and well-nourished.  Eyes: No scleral icterus.  Neck: Neck supple.  Cardiovascular: Normal rate.  Irregular, irregular rhythm  Pulmonary/Chest: Effort normal and breath sounds normal. He has no wheezes.  Abdominal: Soft. Bowel sounds are normal. There is no tenderness.  Musculoskeletal:  Edema of proximal interphalangeal joints of hands bilaterally. No erythema or warmth noted. Limited ROM with flexion due to pain with bending.   Lymphadenopathy:    He has no cervical adenopathy.  Neurological: He is alert and oriented to person, place, and time.  Skin: Skin is warm and dry. No rash noted.       Assessment & Plan:  1. Acute gout of hand, unspecified cause, unspecified laterality Symptoms are consistent with arthritic changes and suspect that this is a gout flare. History of dialysis, presence of [place] nodes, prior episodes of gout, make this most likely. He is taking allopurinol daily and we discussed that treatment options are limited with dialysis; prednisone taper for symptoms will be initiated. We discussed the importance of follow up with his rheumatologist due to long standing chronic joint pain in hips and knees and advised that he keep his nephrologist and rheumatologist aware of this treatment. He has agreed to do so. Return precautions provided. He will have his INR checked 06/29/17 as this is completed weekly. Discussed the importance of keeping this appointment as prednisone can interfere with INR and this will need to be evaluated. He voiced understanding and agreed with plan.   - predniSONE (DELTASONE) 10 MG tablet; Take 40 mg(4 tablets) daily x 3 days, 30 mg(3 tablets) daily x 3 days, 20 mg(2 tablets) daily x 3 days, 10 mg(1 tablet) daily x 3 days.  Dispense: 30 tablet; Refill: 0  Delano Metz, FNP-C

## 2017-06-25 NOTE — Patient Instructions (Signed)
Please keep appointment with kidney specialist and also rheumatology for your chronic joint pain. Also, please monitor dietary choices as discussed. If symptoms do not improve with treatment, worsen, or you develop new symptoms, please follow up with Dr. Caryl Bis for further evaluation and treatment.   Gout Gout is painful swelling that can occur in some of your joints. Gout is a type of arthritis. This condition is caused by having too much uric acid in your body. Uric acid is a chemical that forms when your body breaks down substances called purines. Purines are important for building body proteins. When your body has too much uric acid, sharp crystals can form and build up inside your joints. This causes pain and swelling. Gout attacks can happen quickly and be very painful (acute gout). Over time, the attacks can affect more joints and become more frequent (chronic gout). Gout can also cause uric acid to build up under your skin and inside your kidneys. What are the causes? This condition is caused by too much uric acid in your blood. This can occur because:  Your kidneys do not remove enough uric acid from your blood. This is the most common cause.  Your body makes too much uric acid. This can occur with some cancers and cancer treatments. It can also occur if your body is breaking down too many red blood cells (hemolytic anemia).  You eat too many foods that are high in purines. These foods include organ meats and some seafood. Alcohol, especially beer, is also high in purines.  A gout attack may be triggered by trauma or stress. What increases the risk? This condition is more likely to develop in people who:  Have a family history of gout.  Are male and middle-aged.  Are male and have gone through menopause.  Are obese.  Frequently drink alcohol, especially beer.  Are dehydrated.  Lose weight too quickly.  Have an organ transplant.  Have lead poisoning.  Take certain  medicines, including aspirin, cyclosporine, diuretics, levodopa, and niacin.  Have kidney disease or psoriasis.  What are the signs or symptoms? An attack of acute gout happens quickly. It usually occurs in just one joint. The most common place is the big toe. Attacks often start at night. Other joints that may be affected include joints of the feet, ankle, knee, fingers, wrist, or elbow. Symptoms may include:  Severe pain.  Warmth.  Swelling.  Stiffness.  Tenderness. The affected joint may be very painful to touch.  Shiny, red, or purple skin.  Chills and fever.  Chronic gout may cause symptoms more frequently. More joints may be involved. You may also have white or yellow lumps (tophi) on your hands or feet or in other areas near your joints. How is this diagnosed? This condition is diagnosed based on your symptoms, medical history, and physical exam. You may have tests, such as:  Blood tests to measure uric acid levels.  Removal of joint fluid with a needle (aspiration) to look for uric acid crystals.  X-rays to look for joint damage.  How is this treated? Treatment for this condition has two phases: treating an acute attack and preventing future attacks. Acute gout treatment may include medicines to reduce pain and swelling, including:  NSAIDs.  Steroids. These are strong anti-inflammatory medicines that can be taken by mouth (orally) or injected into a joint.  Colchicine. This medicine relieves pain and swelling when it is taken soon after an attack. It can be given orally or through  an IV tube.  Preventive treatment may include:  Daily use of smaller doses of NSAIDs or colchicine.  Use of a medicine that reduces uric acid levels in your blood.  Changes to your diet. You may need to see a specialist about healthy eating (dietitian).  Follow these instructions at home: During a Gout Attack  If directed, apply ice to the affected area: ? Put ice in a plastic  bag. ? Place a towel between your skin and the bag. ? Leave the ice on for 20 minutes, 2-3 times a day.  Rest the joint as much as possible. If the affected joint is in your leg, you may be given crutches to use.  Raise (elevate) the affected joint above the level of your heart as often as possible.  Drink enough fluids to keep your urine clear or pale yellow.  Take over-the-counter and prescription medicines only as told by your health care provider.  Do not drive or operate heavy machinery while taking prescription pain medicine.  Follow instructions from your health care provider about eating or drinking restrictions.  Return to your normal activities as told by your health care provider. Ask your health care provider what activities are safe for you. Avoiding Future Gout Attacks  Follow a low-purine diet as told by your dietitian or health care provider. Avoid foods and drinks that are high in purines, including liver, kidney, anchovies, asparagus, herring, mushrooms, mussels, and beer.  Limit alcohol intake to no more than 1 drink a day for nonpregnant women and 2 drinks a day for men. One drink equals 12 oz of beer, 5 oz of wine, or 1 oz of hard liquor.  Maintain a healthy weight or lose weight if you are overweight. If you want to lose weight, talk with your health care provider. It is important that you do not lose weight too quickly.  Start or maintain an exercise program as told by your health care provider.  Drink enough fluids to keep your urine clear or pale yellow.  Take over-the-counter and prescription medicines only as told by your health care provider.  Keep all follow-up visits as told by your health care provider. This is important. Contact a health care provider if:  You have another gout attack.  You continue to have symptoms of a gout attack after10 days of treatment.  You have side effects from your medicines.  You have chills or a fever.  You have  burning pain when you urinate.  You have pain in your lower back or belly. Get help right away if:  You have severe or uncontrolled pain.  You cannot urinate. This information is not intended to replace advice given to you by your health care provider. Make sure you discuss any questions you have with your health care provider. Document Released: 03/21/2000 Document Revised: 08/30/2015 Document Reviewed: 01/04/2015 Elsevier Interactive Patient Education  [employer].

## 2017-06-26 DIAGNOSIS — N2581 Secondary hyperparathyroidism of renal origin: Secondary | ICD-10-CM | POA: Diagnosis not present

## 2017-06-26 DIAGNOSIS — D509 Iron deficiency anemia, unspecified: Secondary | ICD-10-CM | POA: Diagnosis not present

## 2017-06-26 DIAGNOSIS — N2589 Other disorders resulting from impaired renal tubular function: Secondary | ICD-10-CM | POA: Diagnosis not present

## 2017-06-26 DIAGNOSIS — N186 End stage renal disease: Secondary | ICD-10-CM | POA: Diagnosis not present

## 2017-06-26 DIAGNOSIS — D631 Anemia in chronic kidney disease: Secondary | ICD-10-CM | POA: Diagnosis not present

## 2017-06-26 NOTE — Telephone Encounter (Signed)
Pt did not want to [facility]. Pt called another Rheumatologist in [place].

## 2017-06-26 NOTE — Telephone Encounter (Signed)
fyi

## 2017-06-27 DIAGNOSIS — N186 End stage renal disease: Secondary | ICD-10-CM | POA: Diagnosis not present

## 2017-06-27 DIAGNOSIS — D631 Anemia in chronic kidney disease: Secondary | ICD-10-CM | POA: Diagnosis not present

## 2017-06-27 DIAGNOSIS — D509 Iron deficiency anemia, unspecified: Secondary | ICD-10-CM | POA: Diagnosis not present

## 2017-06-27 DIAGNOSIS — N2581 Secondary hyperparathyroidism of renal origin: Secondary | ICD-10-CM | POA: Diagnosis not present

## 2017-06-27 DIAGNOSIS — N2589 Other disorders resulting from impaired renal tubular function: Secondary | ICD-10-CM | POA: Diagnosis not present

## 2017-06-28 DIAGNOSIS — N186 End stage renal disease: Secondary | ICD-10-CM | POA: Diagnosis not present

## 2017-06-28 DIAGNOSIS — N2589 Other disorders resulting from impaired renal tubular function: Secondary | ICD-10-CM | POA: Diagnosis not present

## 2017-06-28 DIAGNOSIS — D509 Iron deficiency anemia, unspecified: Secondary | ICD-10-CM | POA: Diagnosis not present

## 2017-06-28 DIAGNOSIS — N2581 Secondary hyperparathyroidism of renal origin: Secondary | ICD-10-CM | POA: Diagnosis not present

## 2017-06-28 DIAGNOSIS — D631 Anemia in chronic kidney disease: Secondary | ICD-10-CM | POA: Diagnosis not present

## 2017-06-29 ENCOUNTER — Ambulatory Visit (INDEPENDENT_AMBULATORY_CARE_PROVIDER_SITE_OTHER): Payer: Medicare Other | Admitting: Pharmacist

## 2017-06-29 ENCOUNTER — Telehealth: Payer: Self-pay | Admitting: Family Medicine

## 2017-06-29 DIAGNOSIS — Z952 Presence of prosthetic heart valve: Secondary | ICD-10-CM

## 2017-06-29 DIAGNOSIS — I481 Persistent atrial fibrillation: Secondary | ICD-10-CM

## 2017-06-29 DIAGNOSIS — N2589 Other disorders resulting from impaired renal tubular function: Secondary | ICD-10-CM | POA: Diagnosis not present

## 2017-06-29 DIAGNOSIS — N2581 Secondary hyperparathyroidism of renal origin: Secondary | ICD-10-CM | POA: Diagnosis not present

## 2017-06-29 DIAGNOSIS — D509 Iron deficiency anemia, unspecified: Secondary | ICD-10-CM | POA: Diagnosis not present

## 2017-06-29 DIAGNOSIS — I4819 Other persistent atrial fibrillation: Secondary | ICD-10-CM

## 2017-06-29 DIAGNOSIS — Z7901 Long term (current) use of anticoagulants: Secondary | ICD-10-CM

## 2017-06-29 DIAGNOSIS — N186 End stage renal disease: Secondary | ICD-10-CM | POA: Diagnosis not present

## 2017-06-29 DIAGNOSIS — D631 Anemia in chronic kidney disease: Secondary | ICD-10-CM | POA: Diagnosis not present

## 2017-06-29 LAB — POCT INR: INR: 2

## 2017-06-29 NOTE — Telephone Encounter (Signed)
fyi

## 2017-06-29 NOTE — Telephone Encounter (Signed)
Please advise 

## 2017-06-29 NOTE — Telephone Encounter (Signed)
Copied from [place] [id number]. Topic: Quick Communication - Other Results >> Jun 29, 2017 11:23 AM Lolita Rieger, RMA wrote: Ladona Horns from [facility] called to report results INR 2.0 taken today

## 2017-06-29 NOTE — Progress Notes (Signed)
I have reviewed the above note and agree. I was available to the pharmacist for consultation.  Annalyssa Thune, MD  

## 2017-06-30 DIAGNOSIS — N2589 Other disorders resulting from impaired renal tubular function: Secondary | ICD-10-CM | POA: Diagnosis not present

## 2017-06-30 DIAGNOSIS — N2581 Secondary hyperparathyroidism of renal origin: Secondary | ICD-10-CM | POA: Diagnosis not present

## 2017-06-30 DIAGNOSIS — N186 End stage renal disease: Secondary | ICD-10-CM | POA: Diagnosis not present

## 2017-06-30 DIAGNOSIS — D631 Anemia in chronic kidney disease: Secondary | ICD-10-CM | POA: Diagnosis not present

## 2017-06-30 DIAGNOSIS — D509 Iron deficiency anemia, unspecified: Secondary | ICD-10-CM | POA: Diagnosis not present

## 2017-06-30 NOTE — Telephone Encounter (Signed)
Is he scheduled with another Rheumatologist in [place] or do I need to call someone else in [place] in order to get records to them?

## 2017-07-01 DIAGNOSIS — D631 Anemia in chronic kidney disease: Secondary | ICD-10-CM | POA: Diagnosis not present

## 2017-07-01 DIAGNOSIS — N2589 Other disorders resulting from impaired renal tubular function: Secondary | ICD-10-CM | POA: Diagnosis not present

## 2017-07-01 DIAGNOSIS — D509 Iron deficiency anemia, unspecified: Secondary | ICD-10-CM | POA: Diagnosis not present

## 2017-07-01 DIAGNOSIS — N186 End stage renal disease: Secondary | ICD-10-CM | POA: Diagnosis not present

## 2017-07-01 DIAGNOSIS — N2581 Secondary hyperparathyroidism of renal origin: Secondary | ICD-10-CM | POA: Diagnosis not present

## 2017-07-02 DIAGNOSIS — D509 Iron deficiency anemia, unspecified: Secondary | ICD-10-CM | POA: Diagnosis not present

## 2017-07-02 DIAGNOSIS — N186 End stage renal disease: Secondary | ICD-10-CM | POA: Diagnosis not present

## 2017-07-02 DIAGNOSIS — N2581 Secondary hyperparathyroidism of renal origin: Secondary | ICD-10-CM | POA: Diagnosis not present

## 2017-07-02 DIAGNOSIS — N2589 Other disorders resulting from impaired renal tubular function: Secondary | ICD-10-CM | POA: Diagnosis not present

## 2017-07-02 DIAGNOSIS — D631 Anemia in chronic kidney disease: Secondary | ICD-10-CM | POA: Diagnosis not present

## 2017-07-03 DIAGNOSIS — D631 Anemia in chronic kidney disease: Secondary | ICD-10-CM | POA: Diagnosis not present

## 2017-07-03 DIAGNOSIS — D509 Iron deficiency anemia, unspecified: Secondary | ICD-10-CM | POA: Diagnosis not present

## 2017-07-03 DIAGNOSIS — N186 End stage renal disease: Secondary | ICD-10-CM | POA: Diagnosis not present

## 2017-07-03 DIAGNOSIS — N2581 Secondary hyperparathyroidism of renal origin: Secondary | ICD-10-CM | POA: Diagnosis not present

## 2017-07-03 DIAGNOSIS — N2589 Other disorders resulting from impaired renal tubular function: Secondary | ICD-10-CM | POA: Diagnosis not present

## 2017-07-04 DIAGNOSIS — N186 End stage renal disease: Secondary | ICD-10-CM | POA: Diagnosis not present

## 2017-07-04 DIAGNOSIS — D509 Iron deficiency anemia, unspecified: Secondary | ICD-10-CM | POA: Diagnosis not present

## 2017-07-04 DIAGNOSIS — N2589 Other disorders resulting from impaired renal tubular function: Secondary | ICD-10-CM | POA: Diagnosis not present

## 2017-07-04 DIAGNOSIS — D631 Anemia in chronic kidney disease: Secondary | ICD-10-CM | POA: Diagnosis not present

## 2017-07-04 DIAGNOSIS — N2581 Secondary hyperparathyroidism of renal origin: Secondary | ICD-10-CM | POA: Diagnosis not present

## 2017-07-05 DIAGNOSIS — D509 Iron deficiency anemia, unspecified: Secondary | ICD-10-CM | POA: Diagnosis not present

## 2017-07-05 DIAGNOSIS — N2589 Other disorders resulting from impaired renal tubular function: Secondary | ICD-10-CM | POA: Diagnosis not present

## 2017-07-05 DIAGNOSIS — N186 End stage renal disease: Secondary | ICD-10-CM | POA: Diagnosis not present

## 2017-07-05 DIAGNOSIS — D631 Anemia in chronic kidney disease: Secondary | ICD-10-CM | POA: Diagnosis not present

## 2017-07-05 DIAGNOSIS — N2581 Secondary hyperparathyroidism of renal origin: Secondary | ICD-10-CM | POA: Diagnosis not present

## 2017-07-05 DIAGNOSIS — T8610 Unspecified complication of kidney transplant: Secondary | ICD-10-CM | POA: Diagnosis not present

## 2017-07-05 DIAGNOSIS — Z992 Dependence on renal dialysis: Secondary | ICD-10-CM | POA: Diagnosis not present

## 2017-07-06 ENCOUNTER — Telehealth: Payer: Self-pay | Admitting: Radiology

## 2017-07-06 ENCOUNTER — Ambulatory Visit (INDEPENDENT_AMBULATORY_CARE_PROVIDER_SITE_OTHER): Payer: Medicare Other | Admitting: Pharmacist

## 2017-07-06 DIAGNOSIS — I4819 Other persistent atrial fibrillation: Secondary | ICD-10-CM

## 2017-07-06 DIAGNOSIS — Z79899 Other long term (current) drug therapy: Secondary | ICD-10-CM | POA: Diagnosis not present

## 2017-07-06 DIAGNOSIS — Z7901 Long term (current) use of anticoagulants: Secondary | ICD-10-CM

## 2017-07-06 DIAGNOSIS — E440 Moderate protein-calorie malnutrition: Secondary | ICD-10-CM | POA: Diagnosis not present

## 2017-07-06 DIAGNOSIS — N186 End stage renal disease: Secondary | ICD-10-CM | POA: Diagnosis not present

## 2017-07-06 DIAGNOSIS — N2581 Secondary hyperparathyroidism of renal origin: Secondary | ICD-10-CM | POA: Diagnosis not present

## 2017-07-06 DIAGNOSIS — D509 Iron deficiency anemia, unspecified: Secondary | ICD-10-CM | POA: Diagnosis not present

## 2017-07-06 DIAGNOSIS — Z952 Presence of prosthetic heart valve: Secondary | ICD-10-CM

## 2017-07-06 DIAGNOSIS — I481 Persistent atrial fibrillation: Secondary | ICD-10-CM

## 2017-07-06 DIAGNOSIS — D631 Anemia in chronic kidney disease: Secondary | ICD-10-CM | POA: Diagnosis not present

## 2017-07-06 DIAGNOSIS — R17 Unspecified jaundice: Secondary | ICD-10-CM | POA: Diagnosis not present

## 2017-07-06 DIAGNOSIS — N2589 Other disorders resulting from impaired renal tubular function: Secondary | ICD-10-CM | POA: Diagnosis not present

## 2017-07-06 LAB — POCT INR: INR: 5.1

## 2017-07-06 NOTE — Telephone Encounter (Signed)
[place] from [facility] called with pt critical INR of 5.1. Read back and confirmed.

## 2017-07-06 NOTE — Telephone Encounter (Signed)
Please see encounter with pharmacist from today.

## 2017-07-06 NOTE — Progress Notes (Signed)
I have reviewed the above note and agree. I was available to the pharmacist for consultation.  Hakiem Malizia, MD  

## 2017-07-07 DIAGNOSIS — D631 Anemia in chronic kidney disease: Secondary | ICD-10-CM | POA: Diagnosis not present

## 2017-07-07 DIAGNOSIS — N2589 Other disorders resulting from impaired renal tubular function: Secondary | ICD-10-CM | POA: Diagnosis not present

## 2017-07-07 DIAGNOSIS — D509 Iron deficiency anemia, unspecified: Secondary | ICD-10-CM | POA: Diagnosis not present

## 2017-07-07 DIAGNOSIS — N2581 Secondary hyperparathyroidism of renal origin: Secondary | ICD-10-CM | POA: Diagnosis not present

## 2017-07-07 DIAGNOSIS — Z79899 Other long term (current) drug therapy: Secondary | ICD-10-CM | POA: Diagnosis not present

## 2017-07-07 DIAGNOSIS — N186 End stage renal disease: Secondary | ICD-10-CM | POA: Diagnosis not present

## 2017-07-08 DIAGNOSIS — Z79899 Other long term (current) drug therapy: Secondary | ICD-10-CM | POA: Diagnosis not present

## 2017-07-08 DIAGNOSIS — D631 Anemia in chronic kidney disease: Secondary | ICD-10-CM | POA: Diagnosis not present

## 2017-07-08 DIAGNOSIS — N2589 Other disorders resulting from impaired renal tubular function: Secondary | ICD-10-CM | POA: Diagnosis not present

## 2017-07-08 DIAGNOSIS — N186 End stage renal disease: Secondary | ICD-10-CM | POA: Diagnosis not present

## 2017-07-08 DIAGNOSIS — D509 Iron deficiency anemia, unspecified: Secondary | ICD-10-CM | POA: Diagnosis not present

## 2017-07-08 DIAGNOSIS — N2581 Secondary hyperparathyroidism of renal origin: Secondary | ICD-10-CM | POA: Diagnosis not present

## 2017-07-09 DIAGNOSIS — D631 Anemia in chronic kidney disease: Secondary | ICD-10-CM | POA: Diagnosis not present

## 2017-07-09 DIAGNOSIS — Z79899 Other long term (current) drug therapy: Secondary | ICD-10-CM | POA: Diagnosis not present

## 2017-07-09 DIAGNOSIS — N2589 Other disorders resulting from impaired renal tubular function: Secondary | ICD-10-CM | POA: Diagnosis not present

## 2017-07-09 DIAGNOSIS — N186 End stage renal disease: Secondary | ICD-10-CM | POA: Diagnosis not present

## 2017-07-09 DIAGNOSIS — N2581 Secondary hyperparathyroidism of renal origin: Secondary | ICD-10-CM | POA: Diagnosis not present

## 2017-07-09 DIAGNOSIS — D509 Iron deficiency anemia, unspecified: Secondary | ICD-10-CM | POA: Diagnosis not present

## 2017-07-10 DIAGNOSIS — N186 End stage renal disease: Secondary | ICD-10-CM | POA: Diagnosis not present

## 2017-07-10 DIAGNOSIS — D631 Anemia in chronic kidney disease: Secondary | ICD-10-CM | POA: Diagnosis not present

## 2017-07-10 DIAGNOSIS — N2589 Other disorders resulting from impaired renal tubular function: Secondary | ICD-10-CM | POA: Diagnosis not present

## 2017-07-10 DIAGNOSIS — N2581 Secondary hyperparathyroidism of renal origin: Secondary | ICD-10-CM | POA: Diagnosis not present

## 2017-07-10 DIAGNOSIS — Z79899 Other long term (current) drug therapy: Secondary | ICD-10-CM | POA: Diagnosis not present

## 2017-07-10 DIAGNOSIS — D509 Iron deficiency anemia, unspecified: Secondary | ICD-10-CM | POA: Diagnosis not present

## 2017-07-11 DIAGNOSIS — N2581 Secondary hyperparathyroidism of renal origin: Secondary | ICD-10-CM | POA: Diagnosis not present

## 2017-07-11 DIAGNOSIS — N186 End stage renal disease: Secondary | ICD-10-CM | POA: Diagnosis not present

## 2017-07-11 DIAGNOSIS — Z79899 Other long term (current) drug therapy: Secondary | ICD-10-CM | POA: Diagnosis not present

## 2017-07-11 DIAGNOSIS — D509 Iron deficiency anemia, unspecified: Secondary | ICD-10-CM | POA: Diagnosis not present

## 2017-07-11 DIAGNOSIS — D631 Anemia in chronic kidney disease: Secondary | ICD-10-CM | POA: Diagnosis not present

## 2017-07-11 DIAGNOSIS — N2589 Other disorders resulting from impaired renal tubular function: Secondary | ICD-10-CM | POA: Diagnosis not present

## 2017-07-12 DIAGNOSIS — Z79899 Other long term (current) drug therapy: Secondary | ICD-10-CM | POA: Diagnosis not present

## 2017-07-12 DIAGNOSIS — N2589 Other disorders resulting from impaired renal tubular function: Secondary | ICD-10-CM | POA: Diagnosis not present

## 2017-07-12 DIAGNOSIS — D631 Anemia in chronic kidney disease: Secondary | ICD-10-CM | POA: Diagnosis not present

## 2017-07-12 DIAGNOSIS — N2581 Secondary hyperparathyroidism of renal origin: Secondary | ICD-10-CM | POA: Diagnosis not present

## 2017-07-12 DIAGNOSIS — N186 End stage renal disease: Secondary | ICD-10-CM | POA: Diagnosis not present

## 2017-07-12 DIAGNOSIS — D509 Iron deficiency anemia, unspecified: Secondary | ICD-10-CM | POA: Diagnosis not present

## 2017-07-13 ENCOUNTER — Ambulatory Visit (INDEPENDENT_AMBULATORY_CARE_PROVIDER_SITE_OTHER): Payer: Medicare Other | Admitting: Pharmacist

## 2017-07-13 DIAGNOSIS — N186 End stage renal disease: Secondary | ICD-10-CM | POA: Diagnosis not present

## 2017-07-13 DIAGNOSIS — Z79899 Other long term (current) drug therapy: Secondary | ICD-10-CM | POA: Diagnosis not present

## 2017-07-13 DIAGNOSIS — I481 Persistent atrial fibrillation: Secondary | ICD-10-CM | POA: Diagnosis not present

## 2017-07-13 DIAGNOSIS — Z952 Presence of prosthetic heart valve: Secondary | ICD-10-CM | POA: Diagnosis not present

## 2017-07-13 DIAGNOSIS — Z7901 Long term (current) use of anticoagulants: Secondary | ICD-10-CM | POA: Diagnosis not present

## 2017-07-13 DIAGNOSIS — D631 Anemia in chronic kidney disease: Secondary | ICD-10-CM | POA: Diagnosis not present

## 2017-07-13 DIAGNOSIS — I482 Chronic atrial fibrillation: Secondary | ICD-10-CM | POA: Diagnosis not present

## 2017-07-13 DIAGNOSIS — N2581 Secondary hyperparathyroidism of renal origin: Secondary | ICD-10-CM | POA: Diagnosis not present

## 2017-07-13 DIAGNOSIS — I4819 Other persistent atrial fibrillation: Secondary | ICD-10-CM

## 2017-07-13 DIAGNOSIS — D509 Iron deficiency anemia, unspecified: Secondary | ICD-10-CM | POA: Diagnosis not present

## 2017-07-13 DIAGNOSIS — N2589 Other disorders resulting from impaired renal tubular function: Secondary | ICD-10-CM | POA: Diagnosis not present

## 2017-07-13 LAB — POCT INR: INR: 3

## 2017-07-13 NOTE — Progress Notes (Signed)
I have reviewed the above note and agree. I was available to the pharmacist for consultation.  Tobby Fawcett, MD  

## 2017-07-14 DIAGNOSIS — D509 Iron deficiency anemia, unspecified: Secondary | ICD-10-CM | POA: Diagnosis not present

## 2017-07-14 DIAGNOSIS — N2581 Secondary hyperparathyroidism of renal origin: Secondary | ICD-10-CM | POA: Diagnosis not present

## 2017-07-14 DIAGNOSIS — Z79899 Other long term (current) drug therapy: Secondary | ICD-10-CM | POA: Diagnosis not present

## 2017-07-14 DIAGNOSIS — N186 End stage renal disease: Secondary | ICD-10-CM | POA: Diagnosis not present

## 2017-07-14 DIAGNOSIS — N2589 Other disorders resulting from impaired renal tubular function: Secondary | ICD-10-CM | POA: Diagnosis not present

## 2017-07-14 DIAGNOSIS — D631 Anemia in chronic kidney disease: Secondary | ICD-10-CM | POA: Diagnosis not present

## 2017-07-15 DIAGNOSIS — N2581 Secondary hyperparathyroidism of renal origin: Secondary | ICD-10-CM | POA: Diagnosis not present

## 2017-07-15 DIAGNOSIS — N186 End stage renal disease: Secondary | ICD-10-CM | POA: Diagnosis not present

## 2017-07-15 DIAGNOSIS — N2589 Other disorders resulting from impaired renal tubular function: Secondary | ICD-10-CM | POA: Diagnosis not present

## 2017-07-15 DIAGNOSIS — D509 Iron deficiency anemia, unspecified: Secondary | ICD-10-CM | POA: Diagnosis not present

## 2017-07-15 DIAGNOSIS — D631 Anemia in chronic kidney disease: Secondary | ICD-10-CM | POA: Diagnosis not present

## 2017-07-15 DIAGNOSIS — Z79899 Other long term (current) drug therapy: Secondary | ICD-10-CM | POA: Diagnosis not present

## 2017-07-16 DIAGNOSIS — Z79899 Other long term (current) drug therapy: Secondary | ICD-10-CM | POA: Diagnosis not present

## 2017-07-16 DIAGNOSIS — N2589 Other disorders resulting from impaired renal tubular function: Secondary | ICD-10-CM | POA: Diagnosis not present

## 2017-07-16 DIAGNOSIS — D631 Anemia in chronic kidney disease: Secondary | ICD-10-CM | POA: Diagnosis not present

## 2017-07-16 DIAGNOSIS — D509 Iron deficiency anemia, unspecified: Secondary | ICD-10-CM | POA: Diagnosis not present

## 2017-07-16 DIAGNOSIS — N2581 Secondary hyperparathyroidism of renal origin: Secondary | ICD-10-CM | POA: Diagnosis not present

## 2017-07-16 DIAGNOSIS — N186 End stage renal disease: Secondary | ICD-10-CM | POA: Diagnosis not present

## 2017-07-17 DIAGNOSIS — N2589 Other disorders resulting from impaired renal tubular function: Secondary | ICD-10-CM | POA: Diagnosis not present

## 2017-07-17 DIAGNOSIS — D509 Iron deficiency anemia, unspecified: Secondary | ICD-10-CM | POA: Diagnosis not present

## 2017-07-17 DIAGNOSIS — Z79899 Other long term (current) drug therapy: Secondary | ICD-10-CM | POA: Diagnosis not present

## 2017-07-17 DIAGNOSIS — N186 End stage renal disease: Secondary | ICD-10-CM | POA: Diagnosis not present

## 2017-07-17 DIAGNOSIS — D631 Anemia in chronic kidney disease: Secondary | ICD-10-CM | POA: Diagnosis not present

## 2017-07-17 DIAGNOSIS — N2581 Secondary hyperparathyroidism of renal origin: Secondary | ICD-10-CM | POA: Diagnosis not present

## 2017-07-18 DIAGNOSIS — D631 Anemia in chronic kidney disease: Secondary | ICD-10-CM | POA: Diagnosis not present

## 2017-07-18 DIAGNOSIS — D509 Iron deficiency anemia, unspecified: Secondary | ICD-10-CM | POA: Diagnosis not present

## 2017-07-18 DIAGNOSIS — N186 End stage renal disease: Secondary | ICD-10-CM | POA: Diagnosis not present

## 2017-07-18 DIAGNOSIS — N2581 Secondary hyperparathyroidism of renal origin: Secondary | ICD-10-CM | POA: Diagnosis not present

## 2017-07-18 DIAGNOSIS — N2589 Other disorders resulting from impaired renal tubular function: Secondary | ICD-10-CM | POA: Diagnosis not present

## 2017-07-18 DIAGNOSIS — Z79899 Other long term (current) drug therapy: Secondary | ICD-10-CM | POA: Diagnosis not present

## 2017-07-19 DIAGNOSIS — N186 End stage renal disease: Secondary | ICD-10-CM | POA: Diagnosis not present

## 2017-07-19 DIAGNOSIS — N2581 Secondary hyperparathyroidism of renal origin: Secondary | ICD-10-CM | POA: Diagnosis not present

## 2017-07-19 DIAGNOSIS — Z79899 Other long term (current) drug therapy: Secondary | ICD-10-CM | POA: Diagnosis not present

## 2017-07-19 DIAGNOSIS — D631 Anemia in chronic kidney disease: Secondary | ICD-10-CM | POA: Diagnosis not present

## 2017-07-19 DIAGNOSIS — N2589 Other disorders resulting from impaired renal tubular function: Secondary | ICD-10-CM | POA: Diagnosis not present

## 2017-07-19 DIAGNOSIS — D509 Iron deficiency anemia, unspecified: Secondary | ICD-10-CM | POA: Diagnosis not present

## 2017-07-20 ENCOUNTER — Ambulatory Visit (INDEPENDENT_AMBULATORY_CARE_PROVIDER_SITE_OTHER): Payer: Medicare Other | Admitting: Pharmacist

## 2017-07-20 DIAGNOSIS — Z952 Presence of prosthetic heart valve: Secondary | ICD-10-CM

## 2017-07-20 DIAGNOSIS — N2581 Secondary hyperparathyroidism of renal origin: Secondary | ICD-10-CM | POA: Diagnosis not present

## 2017-07-20 DIAGNOSIS — I4819 Other persistent atrial fibrillation: Secondary | ICD-10-CM

## 2017-07-20 DIAGNOSIS — I481 Persistent atrial fibrillation: Secondary | ICD-10-CM

## 2017-07-20 DIAGNOSIS — Z7901 Long term (current) use of anticoagulants: Secondary | ICD-10-CM

## 2017-07-20 DIAGNOSIS — N186 End stage renal disease: Secondary | ICD-10-CM | POA: Diagnosis not present

## 2017-07-20 DIAGNOSIS — Z79899 Other long term (current) drug therapy: Secondary | ICD-10-CM | POA: Diagnosis not present

## 2017-07-20 DIAGNOSIS — D509 Iron deficiency anemia, unspecified: Secondary | ICD-10-CM | POA: Diagnosis not present

## 2017-07-20 DIAGNOSIS — N2589 Other disorders resulting from impaired renal tubular function: Secondary | ICD-10-CM | POA: Diagnosis not present

## 2017-07-20 DIAGNOSIS — D631 Anemia in chronic kidney disease: Secondary | ICD-10-CM | POA: Diagnosis not present

## 2017-07-20 LAB — POCT INR: INR: 3.9

## 2017-07-20 NOTE — Progress Notes (Signed)
I have reviewed the above note and agree. I was available to the pharmacist for consultation.  Luka Stohr, MD  

## 2017-07-21 DIAGNOSIS — N2581 Secondary hyperparathyroidism of renal origin: Secondary | ICD-10-CM | POA: Diagnosis not present

## 2017-07-21 DIAGNOSIS — D509 Iron deficiency anemia, unspecified: Secondary | ICD-10-CM | POA: Diagnosis not present

## 2017-07-21 DIAGNOSIS — N2589 Other disorders resulting from impaired renal tubular function: Secondary | ICD-10-CM | POA: Diagnosis not present

## 2017-07-21 DIAGNOSIS — Z79899 Other long term (current) drug therapy: Secondary | ICD-10-CM | POA: Diagnosis not present

## 2017-07-21 DIAGNOSIS — N186 End stage renal disease: Secondary | ICD-10-CM | POA: Diagnosis not present

## 2017-07-21 DIAGNOSIS — D631 Anemia in chronic kidney disease: Secondary | ICD-10-CM | POA: Diagnosis not present

## 2017-07-22 DIAGNOSIS — N186 End stage renal disease: Secondary | ICD-10-CM | POA: Diagnosis not present

## 2017-07-22 DIAGNOSIS — N2581 Secondary hyperparathyroidism of renal origin: Secondary | ICD-10-CM | POA: Diagnosis not present

## 2017-07-22 DIAGNOSIS — D631 Anemia in chronic kidney disease: Secondary | ICD-10-CM | POA: Diagnosis not present

## 2017-07-22 DIAGNOSIS — N2589 Other disorders resulting from impaired renal tubular function: Secondary | ICD-10-CM | POA: Diagnosis not present

## 2017-07-22 DIAGNOSIS — D509 Iron deficiency anemia, unspecified: Secondary | ICD-10-CM | POA: Diagnosis not present

## 2017-07-22 DIAGNOSIS — Z79899 Other long term (current) drug therapy: Secondary | ICD-10-CM | POA: Diagnosis not present

## 2017-07-23 DIAGNOSIS — Z79899 Other long term (current) drug therapy: Secondary | ICD-10-CM | POA: Diagnosis not present

## 2017-07-23 DIAGNOSIS — N186 End stage renal disease: Secondary | ICD-10-CM | POA: Diagnosis not present

## 2017-07-23 DIAGNOSIS — N2589 Other disorders resulting from impaired renal tubular function: Secondary | ICD-10-CM | POA: Diagnosis not present

## 2017-07-23 DIAGNOSIS — N2581 Secondary hyperparathyroidism of renal origin: Secondary | ICD-10-CM | POA: Diagnosis not present

## 2017-07-23 DIAGNOSIS — D631 Anemia in chronic kidney disease: Secondary | ICD-10-CM | POA: Diagnosis not present

## 2017-07-23 DIAGNOSIS — D509 Iron deficiency anemia, unspecified: Secondary | ICD-10-CM | POA: Diagnosis not present

## 2017-07-24 DIAGNOSIS — N186 End stage renal disease: Secondary | ICD-10-CM | POA: Diagnosis not present

## 2017-07-24 DIAGNOSIS — N2581 Secondary hyperparathyroidism of renal origin: Secondary | ICD-10-CM | POA: Diagnosis not present

## 2017-07-24 DIAGNOSIS — D631 Anemia in chronic kidney disease: Secondary | ICD-10-CM | POA: Diagnosis not present

## 2017-07-24 DIAGNOSIS — D509 Iron deficiency anemia, unspecified: Secondary | ICD-10-CM | POA: Diagnosis not present

## 2017-07-24 DIAGNOSIS — N2589 Other disorders resulting from impaired renal tubular function: Secondary | ICD-10-CM | POA: Diagnosis not present

## 2017-07-24 DIAGNOSIS — Z79899 Other long term (current) drug therapy: Secondary | ICD-10-CM | POA: Diagnosis not present

## 2017-07-25 DIAGNOSIS — D631 Anemia in chronic kidney disease: Secondary | ICD-10-CM | POA: Diagnosis not present

## 2017-07-25 DIAGNOSIS — D509 Iron deficiency anemia, unspecified: Secondary | ICD-10-CM | POA: Diagnosis not present

## 2017-07-25 DIAGNOSIS — N2589 Other disorders resulting from impaired renal tubular function: Secondary | ICD-10-CM | POA: Diagnosis not present

## 2017-07-25 DIAGNOSIS — N186 End stage renal disease: Secondary | ICD-10-CM | POA: Diagnosis not present

## 2017-07-25 DIAGNOSIS — Z79899 Other long term (current) drug therapy: Secondary | ICD-10-CM | POA: Diagnosis not present

## 2017-07-25 DIAGNOSIS — N2581 Secondary hyperparathyroidism of renal origin: Secondary | ICD-10-CM | POA: Diagnosis not present

## 2017-07-26 DIAGNOSIS — N2581 Secondary hyperparathyroidism of renal origin: Secondary | ICD-10-CM | POA: Diagnosis not present

## 2017-07-26 DIAGNOSIS — N186 End stage renal disease: Secondary | ICD-10-CM | POA: Diagnosis not present

## 2017-07-26 DIAGNOSIS — N2589 Other disorders resulting from impaired renal tubular function: Secondary | ICD-10-CM | POA: Diagnosis not present

## 2017-07-26 DIAGNOSIS — D509 Iron deficiency anemia, unspecified: Secondary | ICD-10-CM | POA: Diagnosis not present

## 2017-07-26 DIAGNOSIS — Z79899 Other long term (current) drug therapy: Secondary | ICD-10-CM | POA: Diagnosis not present

## 2017-07-26 DIAGNOSIS — D631 Anemia in chronic kidney disease: Secondary | ICD-10-CM | POA: Diagnosis not present

## 2017-07-27 ENCOUNTER — Ambulatory Visit (INDEPENDENT_AMBULATORY_CARE_PROVIDER_SITE_OTHER): Payer: Medicare Other | Admitting: Pharmacist

## 2017-07-27 ENCOUNTER — Telehealth: Payer: Self-pay | Admitting: Family Medicine

## 2017-07-27 DIAGNOSIS — D631 Anemia in chronic kidney disease: Secondary | ICD-10-CM | POA: Diagnosis not present

## 2017-07-27 DIAGNOSIS — D509 Iron deficiency anemia, unspecified: Secondary | ICD-10-CM | POA: Diagnosis not present

## 2017-07-27 DIAGNOSIS — Z79899 Other long term (current) drug therapy: Secondary | ICD-10-CM | POA: Diagnosis not present

## 2017-07-27 DIAGNOSIS — N186 End stage renal disease: Secondary | ICD-10-CM | POA: Diagnosis not present

## 2017-07-27 DIAGNOSIS — I4819 Other persistent atrial fibrillation: Secondary | ICD-10-CM

## 2017-07-27 DIAGNOSIS — I481 Persistent atrial fibrillation: Secondary | ICD-10-CM | POA: Diagnosis not present

## 2017-07-27 DIAGNOSIS — N2589 Other disorders resulting from impaired renal tubular function: Secondary | ICD-10-CM | POA: Diagnosis not present

## 2017-07-27 DIAGNOSIS — N2581 Secondary hyperparathyroidism of renal origin: Secondary | ICD-10-CM | POA: Diagnosis not present

## 2017-07-27 DIAGNOSIS — Z952 Presence of prosthetic heart valve: Secondary | ICD-10-CM

## 2017-07-27 LAB — POCT INR: INR: 3.9

## 2017-07-27 NOTE — Telephone Encounter (Signed)
fyi

## 2017-07-27 NOTE — Progress Notes (Signed)
I have reviewed the above note and agree. I was available to the pharmacist for consultation.  Eric Sonnenberg, MD  

## 2017-07-27 NOTE — Telephone Encounter (Signed)
INR 3.9 (2.5-3.5) Contact pt if needs dose adjustment Sending high priority Notified Patina at PCP office

## 2017-07-27 NOTE — Telephone Encounter (Signed)
Jeremy [name] aware of results and have discussed with patient.

## 2017-07-27 NOTE — Telephone Encounter (Signed)
Noted.  Caroline's note has been reviewed.

## 2017-07-28 DIAGNOSIS — D631 Anemia in chronic kidney disease: Secondary | ICD-10-CM | POA: Diagnosis not present

## 2017-07-28 DIAGNOSIS — D509 Iron deficiency anemia, unspecified: Secondary | ICD-10-CM | POA: Diagnosis not present

## 2017-07-28 DIAGNOSIS — N2581 Secondary hyperparathyroidism of renal origin: Secondary | ICD-10-CM | POA: Diagnosis not present

## 2017-07-28 DIAGNOSIS — N186 End stage renal disease: Secondary | ICD-10-CM | POA: Diagnosis not present

## 2017-07-28 DIAGNOSIS — N2589 Other disorders resulting from impaired renal tubular function: Secondary | ICD-10-CM | POA: Diagnosis not present

## 2017-07-28 DIAGNOSIS — Z79899 Other long term (current) drug therapy: Secondary | ICD-10-CM | POA: Diagnosis not present

## 2017-07-29 DIAGNOSIS — N2589 Other disorders resulting from impaired renal tubular function: Secondary | ICD-10-CM | POA: Diagnosis not present

## 2017-07-29 DIAGNOSIS — D509 Iron deficiency anemia, unspecified: Secondary | ICD-10-CM | POA: Diagnosis not present

## 2017-07-29 DIAGNOSIS — N186 End stage renal disease: Secondary | ICD-10-CM | POA: Diagnosis not present

## 2017-07-29 DIAGNOSIS — N2581 Secondary hyperparathyroidism of renal origin: Secondary | ICD-10-CM | POA: Diagnosis not present

## 2017-07-29 DIAGNOSIS — D631 Anemia in chronic kidney disease: Secondary | ICD-10-CM | POA: Diagnosis not present

## 2017-07-29 DIAGNOSIS — Z79899 Other long term (current) drug therapy: Secondary | ICD-10-CM | POA: Diagnosis not present

## 2017-07-30 DIAGNOSIS — Z79899 Other long term (current) drug therapy: Secondary | ICD-10-CM | POA: Diagnosis not present

## 2017-07-30 DIAGNOSIS — N2589 Other disorders resulting from impaired renal tubular function: Secondary | ICD-10-CM | POA: Diagnosis not present

## 2017-07-30 DIAGNOSIS — N186 End stage renal disease: Secondary | ICD-10-CM | POA: Diagnosis not present

## 2017-07-30 DIAGNOSIS — D631 Anemia in chronic kidney disease: Secondary | ICD-10-CM | POA: Diagnosis not present

## 2017-07-30 DIAGNOSIS — D509 Iron deficiency anemia, unspecified: Secondary | ICD-10-CM | POA: Diagnosis not present

## 2017-07-30 DIAGNOSIS — N2581 Secondary hyperparathyroidism of renal origin: Secondary | ICD-10-CM | POA: Diagnosis not present

## 2017-07-31 DIAGNOSIS — N186 End stage renal disease: Secondary | ICD-10-CM | POA: Diagnosis not present

## 2017-07-31 DIAGNOSIS — N2581 Secondary hyperparathyroidism of renal origin: Secondary | ICD-10-CM | POA: Diagnosis not present

## 2017-07-31 DIAGNOSIS — D631 Anemia in chronic kidney disease: Secondary | ICD-10-CM | POA: Diagnosis not present

## 2017-07-31 DIAGNOSIS — N2589 Other disorders resulting from impaired renal tubular function: Secondary | ICD-10-CM | POA: Diagnosis not present

## 2017-07-31 DIAGNOSIS — Z79899 Other long term (current) drug therapy: Secondary | ICD-10-CM | POA: Diagnosis not present

## 2017-07-31 DIAGNOSIS — D509 Iron deficiency anemia, unspecified: Secondary | ICD-10-CM | POA: Diagnosis not present

## 2017-08-01 DIAGNOSIS — D631 Anemia in chronic kidney disease: Secondary | ICD-10-CM | POA: Diagnosis not present

## 2017-08-01 DIAGNOSIS — N186 End stage renal disease: Secondary | ICD-10-CM | POA: Diagnosis not present

## 2017-08-01 DIAGNOSIS — N2581 Secondary hyperparathyroidism of renal origin: Secondary | ICD-10-CM | POA: Diagnosis not present

## 2017-08-01 DIAGNOSIS — Z79899 Other long term (current) drug therapy: Secondary | ICD-10-CM | POA: Diagnosis not present

## 2017-08-01 DIAGNOSIS — N2589 Other disorders resulting from impaired renal tubular function: Secondary | ICD-10-CM | POA: Diagnosis not present

## 2017-08-01 DIAGNOSIS — D509 Iron deficiency anemia, unspecified: Secondary | ICD-10-CM | POA: Diagnosis not present

## 2017-08-02 DIAGNOSIS — N2581 Secondary hyperparathyroidism of renal origin: Secondary | ICD-10-CM | POA: Diagnosis not present

## 2017-08-02 DIAGNOSIS — N2589 Other disorders resulting from impaired renal tubular function: Secondary | ICD-10-CM | POA: Diagnosis not present

## 2017-08-02 DIAGNOSIS — N186 End stage renal disease: Secondary | ICD-10-CM | POA: Diagnosis not present

## 2017-08-02 DIAGNOSIS — D509 Iron deficiency anemia, unspecified: Secondary | ICD-10-CM | POA: Diagnosis not present

## 2017-08-02 DIAGNOSIS — Z79899 Other long term (current) drug therapy: Secondary | ICD-10-CM | POA: Diagnosis not present

## 2017-08-02 DIAGNOSIS — D631 Anemia in chronic kidney disease: Secondary | ICD-10-CM | POA: Diagnosis not present

## 2017-08-03 ENCOUNTER — Ambulatory Visit (INDEPENDENT_AMBULATORY_CARE_PROVIDER_SITE_OTHER): Payer: Medicare Other | Admitting: Pharmacist

## 2017-08-03 DIAGNOSIS — Z7901 Long term (current) use of anticoagulants: Secondary | ICD-10-CM

## 2017-08-03 DIAGNOSIS — I481 Persistent atrial fibrillation: Secondary | ICD-10-CM

## 2017-08-03 DIAGNOSIS — D631 Anemia in chronic kidney disease: Secondary | ICD-10-CM | POA: Diagnosis not present

## 2017-08-03 DIAGNOSIS — N186 End stage renal disease: Secondary | ICD-10-CM | POA: Diagnosis not present

## 2017-08-03 DIAGNOSIS — Z79899 Other long term (current) drug therapy: Secondary | ICD-10-CM | POA: Diagnosis not present

## 2017-08-03 DIAGNOSIS — D509 Iron deficiency anemia, unspecified: Secondary | ICD-10-CM | POA: Diagnosis not present

## 2017-08-03 DIAGNOSIS — N2581 Secondary hyperparathyroidism of renal origin: Secondary | ICD-10-CM | POA: Diagnosis not present

## 2017-08-03 DIAGNOSIS — Z952 Presence of prosthetic heart valve: Secondary | ICD-10-CM

## 2017-08-03 DIAGNOSIS — I4819 Other persistent atrial fibrillation: Secondary | ICD-10-CM

## 2017-08-03 DIAGNOSIS — N2589 Other disorders resulting from impaired renal tubular function: Secondary | ICD-10-CM | POA: Diagnosis not present

## 2017-08-03 LAB — POCT INR: INR: 1.9

## 2017-08-03 NOTE — Progress Notes (Signed)
I have reviewed the above note and agree. I was available to the pharmacist for consultation.  Eric Sonnenberg, MD  

## 2017-08-04 DIAGNOSIS — Z992 Dependence on renal dialysis: Secondary | ICD-10-CM | POA: Diagnosis not present

## 2017-08-04 DIAGNOSIS — N2581 Secondary hyperparathyroidism of renal origin: Secondary | ICD-10-CM | POA: Diagnosis not present

## 2017-08-04 DIAGNOSIS — T8610 Unspecified complication of kidney transplant: Secondary | ICD-10-CM | POA: Diagnosis not present

## 2017-08-04 DIAGNOSIS — N2589 Other disorders resulting from impaired renal tubular function: Secondary | ICD-10-CM | POA: Diagnosis not present

## 2017-08-04 DIAGNOSIS — D631 Anemia in chronic kidney disease: Secondary | ICD-10-CM | POA: Diagnosis not present

## 2017-08-04 DIAGNOSIS — Z79899 Other long term (current) drug therapy: Secondary | ICD-10-CM | POA: Diagnosis not present

## 2017-08-04 DIAGNOSIS — D509 Iron deficiency anemia, unspecified: Secondary | ICD-10-CM | POA: Diagnosis not present

## 2017-08-04 DIAGNOSIS — N186 End stage renal disease: Secondary | ICD-10-CM | POA: Diagnosis not present

## 2017-08-05 DIAGNOSIS — N186 End stage renal disease: Secondary | ICD-10-CM | POA: Diagnosis not present

## 2017-08-05 DIAGNOSIS — Z79899 Other long term (current) drug therapy: Secondary | ICD-10-CM | POA: Diagnosis not present

## 2017-08-05 DIAGNOSIS — D509 Iron deficiency anemia, unspecified: Secondary | ICD-10-CM | POA: Diagnosis not present

## 2017-08-05 DIAGNOSIS — N2589 Other disorders resulting from impaired renal tubular function: Secondary | ICD-10-CM | POA: Diagnosis not present

## 2017-08-05 DIAGNOSIS — D631 Anemia in chronic kidney disease: Secondary | ICD-10-CM | POA: Diagnosis not present

## 2017-08-05 DIAGNOSIS — E440 Moderate protein-calorie malnutrition: Secondary | ICD-10-CM | POA: Diagnosis not present

## 2017-08-05 DIAGNOSIS — R17 Unspecified jaundice: Secondary | ICD-10-CM | POA: Diagnosis not present

## 2017-08-05 DIAGNOSIS — N2581 Secondary hyperparathyroidism of renal origin: Secondary | ICD-10-CM | POA: Diagnosis not present

## 2017-08-06 DIAGNOSIS — D631 Anemia in chronic kidney disease: Secondary | ICD-10-CM | POA: Diagnosis not present

## 2017-08-06 DIAGNOSIS — N2589 Other disorders resulting from impaired renal tubular function: Secondary | ICD-10-CM | POA: Diagnosis not present

## 2017-08-06 DIAGNOSIS — Z79899 Other long term (current) drug therapy: Secondary | ICD-10-CM | POA: Diagnosis not present

## 2017-08-06 DIAGNOSIS — N2581 Secondary hyperparathyroidism of renal origin: Secondary | ICD-10-CM | POA: Diagnosis not present

## 2017-08-06 DIAGNOSIS — D509 Iron deficiency anemia, unspecified: Secondary | ICD-10-CM | POA: Diagnosis not present

## 2017-08-06 DIAGNOSIS — N186 End stage renal disease: Secondary | ICD-10-CM | POA: Diagnosis not present

## 2017-08-07 DIAGNOSIS — N2581 Secondary hyperparathyroidism of renal origin: Secondary | ICD-10-CM | POA: Diagnosis not present

## 2017-08-07 DIAGNOSIS — N186 End stage renal disease: Secondary | ICD-10-CM | POA: Diagnosis not present

## 2017-08-07 DIAGNOSIS — D631 Anemia in chronic kidney disease: Secondary | ICD-10-CM | POA: Diagnosis not present

## 2017-08-07 DIAGNOSIS — D509 Iron deficiency anemia, unspecified: Secondary | ICD-10-CM | POA: Diagnosis not present

## 2017-08-07 DIAGNOSIS — Z79899 Other long term (current) drug therapy: Secondary | ICD-10-CM | POA: Diagnosis not present

## 2017-08-07 DIAGNOSIS — N2589 Other disorders resulting from impaired renal tubular function: Secondary | ICD-10-CM | POA: Diagnosis not present

## 2017-08-08 DIAGNOSIS — N2581 Secondary hyperparathyroidism of renal origin: Secondary | ICD-10-CM | POA: Diagnosis not present

## 2017-08-08 DIAGNOSIS — D631 Anemia in chronic kidney disease: Secondary | ICD-10-CM | POA: Diagnosis not present

## 2017-08-08 DIAGNOSIS — D509 Iron deficiency anemia, unspecified: Secondary | ICD-10-CM | POA: Diagnosis not present

## 2017-08-08 DIAGNOSIS — N186 End stage renal disease: Secondary | ICD-10-CM | POA: Diagnosis not present

## 2017-08-08 DIAGNOSIS — N2589 Other disorders resulting from impaired renal tubular function: Secondary | ICD-10-CM | POA: Diagnosis not present

## 2017-08-08 DIAGNOSIS — Z79899 Other long term (current) drug therapy: Secondary | ICD-10-CM | POA: Diagnosis not present

## 2017-08-09 DIAGNOSIS — D631 Anemia in chronic kidney disease: Secondary | ICD-10-CM | POA: Diagnosis not present

## 2017-08-09 DIAGNOSIS — N2581 Secondary hyperparathyroidism of renal origin: Secondary | ICD-10-CM | POA: Diagnosis not present

## 2017-08-09 DIAGNOSIS — N2589 Other disorders resulting from impaired renal tubular function: Secondary | ICD-10-CM | POA: Diagnosis not present

## 2017-08-09 DIAGNOSIS — Z79899 Other long term (current) drug therapy: Secondary | ICD-10-CM | POA: Diagnosis not present

## 2017-08-09 DIAGNOSIS — D509 Iron deficiency anemia, unspecified: Secondary | ICD-10-CM | POA: Diagnosis not present

## 2017-08-09 DIAGNOSIS — N186 End stage renal disease: Secondary | ICD-10-CM | POA: Diagnosis not present

## 2017-08-10 ENCOUNTER — Ambulatory Visit (INDEPENDENT_AMBULATORY_CARE_PROVIDER_SITE_OTHER): Payer: Medicare Other | Admitting: Pharmacist

## 2017-08-10 ENCOUNTER — Telehealth: Payer: Self-pay | Admitting: Pharmacist

## 2017-08-10 DIAGNOSIS — I481 Persistent atrial fibrillation: Secondary | ICD-10-CM

## 2017-08-10 DIAGNOSIS — D509 Iron deficiency anemia, unspecified: Secondary | ICD-10-CM | POA: Diagnosis not present

## 2017-08-10 DIAGNOSIS — I4819 Other persistent atrial fibrillation: Secondary | ICD-10-CM

## 2017-08-10 DIAGNOSIS — N2589 Other disorders resulting from impaired renal tubular function: Secondary | ICD-10-CM | POA: Diagnosis not present

## 2017-08-10 DIAGNOSIS — D631 Anemia in chronic kidney disease: Secondary | ICD-10-CM | POA: Diagnosis not present

## 2017-08-10 DIAGNOSIS — I482 Chronic atrial fibrillation: Secondary | ICD-10-CM | POA: Diagnosis not present

## 2017-08-10 DIAGNOSIS — Z79899 Other long term (current) drug therapy: Secondary | ICD-10-CM | POA: Diagnosis not present

## 2017-08-10 DIAGNOSIS — N186 End stage renal disease: Secondary | ICD-10-CM | POA: Diagnosis not present

## 2017-08-10 DIAGNOSIS — Z7901 Long term (current) use of anticoagulants: Secondary | ICD-10-CM | POA: Diagnosis not present

## 2017-08-10 DIAGNOSIS — Z952 Presence of prosthetic heart valve: Secondary | ICD-10-CM

## 2017-08-10 DIAGNOSIS — N2581 Secondary hyperparathyroidism of renal origin: Secondary | ICD-10-CM | POA: Diagnosis not present

## 2017-08-10 LAB — POCT INR: INR: 2.3

## 2017-08-10 NOTE — Telephone Encounter (Signed)
Called pt to f/u on home-INR. No answer, left HIPAA-compliant VM requesting he return my call.   Carlean Jews, Pharm.D., BCPS PGY2 Ambulatory Care Pharmacy Resident Phone: [phone]

## 2017-08-10 NOTE — Progress Notes (Signed)
I have reviewed the above note and agree.  Chauncey Bruno, M.D.  

## 2017-08-11 ENCOUNTER — Encounter: Payer: Self-pay | Admitting: Family Medicine

## 2017-08-11 ENCOUNTER — Other Ambulatory Visit: Payer: Self-pay

## 2017-08-11 ENCOUNTER — Ambulatory Visit (INDEPENDENT_AMBULATORY_CARE_PROVIDER_SITE_OTHER): Payer: Medicare Other | Admitting: Family Medicine

## 2017-08-11 VITALS — BP 120/60 | HR 72 | Temp 98.3°F | Ht 69.0 in | Wt 183.6 lb

## 2017-08-11 DIAGNOSIS — N186 End stage renal disease: Secondary | ICD-10-CM

## 2017-08-11 DIAGNOSIS — M2550 Pain in unspecified joint: Secondary | ICD-10-CM

## 2017-08-11 DIAGNOSIS — E785 Hyperlipidemia, unspecified: Secondary | ICD-10-CM

## 2017-08-11 DIAGNOSIS — Z992 Dependence on renal dialysis: Secondary | ICD-10-CM | POA: Diagnosis not present

## 2017-08-11 DIAGNOSIS — I2510 Atherosclerotic heart disease of native coronary artery without angina pectoris: Secondary | ICD-10-CM | POA: Diagnosis not present

## 2017-08-11 DIAGNOSIS — E291 Testicular hypofunction: Secondary | ICD-10-CM | POA: Diagnosis not present

## 2017-08-11 DIAGNOSIS — F322 Major depressive disorder, single episode, severe without psychotic features: Secondary | ICD-10-CM | POA: Diagnosis not present

## 2017-08-11 LAB — HEPATIC FUNCTION PANEL
ALBUMIN: 3.1 g/dL — AB (ref 3.5–5.2)
ALT: 10 U/L (ref 0–53)
AST: 15 U/L (ref 0–37)
Alkaline Phosphatase: 133 U/L — ABNORMAL HIGH (ref 39–117)
Bilirubin, Direct: 0.1 mg/dL (ref 0.0–0.3)
TOTAL PROTEIN: 6.8 g/dL (ref 6.0–8.3)
Total Bilirubin: 0.4 mg/dL (ref 0.2–1.2)

## 2017-08-11 LAB — LDL CHOLESTEROL, DIRECT: LDL DIRECT: 61 mg/dL

## 2017-08-11 NOTE — Progress Notes (Signed)
  Tommi Rumps, MD Phone: [phone]  Jeremy [name] is a 62 y.o. male who presents today for f/u.  Depression: Notes his symptoms have improved and he does not really feel depressed.  He has been taking Prozac.  He just does not have the get up and go he used to.  No SI.  He continues on peritoneal dialysis.  He sees nephrology today.  They may increase the number of hours per day he is doing this.  He may want to go back on hemodialysis.  He saw urology after he was started on testosterone.  He notes he has now gone off of this as his PSA jumped a point in about a month.  They are planning on rechecking that soon.  He is no longer on testosterone supplementation.  He sees rheumatology for his chronic joint aches.  He notes he takes Tylenol for these.  Mostly his left knee is hurting him now and it hurts to put pressure on it in his kneecap.  No injury.  This has worse the last couple of months.  Hyperlipidemia: Currently on pravastatin Monday Wednesday Friday.  This was decreased given prior aching though he is still aching.  No chest pain.  Social History   Tobacco Use  Smoking Status Never Smoker  Smokeless Tobacco Never Used     ROS see history of present illness  Objective  Physical Exam Vitals:   08/11/17 1422  BP: 120/60  Pulse: 72  Temp: 98.3 F (36.8 C)  SpO2: 97%    BP Readings from Last 3 Encounters:  08/11/17 120/60  06/25/17 130/64  06/24/17 127/62   Wt Readings from Last 3 Encounters:  08/11/17 183 lb 9.6 oz (83.3 kg)  06/25/17 191 lb 4 oz (86.8 kg)  06/24/17 188 lb (85.3 kg)    Physical Exam  Constitutional: No distress.  Cardiovascular: Normal rate and normal heart sounds. An irregularly irregular rhythm present.  Pulmonary/Chest: Effort normal and breath sounds normal.  Musculoskeletal: He exhibits no edema.  Left knee with no swelling or tenderness, no warmth erythema, negative McMurray's  Neurological: He is alert.  Skin: Skin is warm and  dry. He is not diaphoretic.     Assessment/Plan: Please see individual problem list.  Hypogonadism in male No longer on testosterone.  He will continue to follow with urology for his PSA.  ESRD on dialysis [facility]) He will continue to see nephrology.  Hyperlipidemia Check LDL and hepatic function panel.  Continue pravastatin Monday Wednesday Friday for now.  Depression, major, single episode, severe ([place]) He notes no depression currently.  He will continue Prozac for now.  Arthralgia Suspect arthritic pain in his knee.  Discussed x-raying this though he deferred to his appointment with rheumatology.   Orders Placed This Encounter  Procedures  . Direct LDL  . Hepatic function panel    No orders of the defined types were placed in this encounter.    Tommi Rumps, MD [place]

## 2017-08-11 NOTE — Assessment & Plan Note (Signed)
He will continue to see nephrology. 

## 2017-08-11 NOTE — Assessment & Plan Note (Signed)
No longer on testosterone.  He will continue to follow with urology for his PSA.

## 2017-08-11 NOTE — Assessment & Plan Note (Signed)
Check LDL and hepatic function panel.  Continue pravastatin Monday Wednesday Friday for now.

## 2017-08-11 NOTE — Assessment & Plan Note (Signed)
He notes no depression currently.  He will continue Prozac for now.

## 2017-08-11 NOTE — Assessment & Plan Note (Signed)
Suspect arthritic pain in his knee.  Discussed x-raying this though he deferred to his appointment with rheumatology.

## 2017-08-11 NOTE — Patient Instructions (Signed)
Nice to see you. We will check lab work today. Please keep your lab appointment with urology and keep your appointment with rheumatology. Please continue the Prozac. If you develop thoughts of harming yourself please go to the emergency room.

## 2017-08-13 DIAGNOSIS — E790 Hyperuricemia without signs of inflammatory arthritis and tophaceous disease: Secondary | ICD-10-CM | POA: Diagnosis not present

## 2017-08-13 DIAGNOSIS — N186 End stage renal disease: Secondary | ICD-10-CM | POA: Diagnosis not present

## 2017-08-13 DIAGNOSIS — R5383 Other fatigue: Secondary | ICD-10-CM | POA: Diagnosis not present

## 2017-08-13 DIAGNOSIS — M150 Primary generalized (osteo)arthritis: Secondary | ICD-10-CM | POA: Diagnosis not present

## 2017-08-13 DIAGNOSIS — M2550 Pain in unspecified joint: Secondary | ICD-10-CM | POA: Diagnosis not present

## 2017-08-13 DIAGNOSIS — E663 Overweight: Secondary | ICD-10-CM | POA: Diagnosis not present

## 2017-08-13 DIAGNOSIS — Z6826 Body mass index (BMI) 26.0-26.9, adult: Secondary | ICD-10-CM | POA: Diagnosis not present

## 2017-08-16 ENCOUNTER — Other Ambulatory Visit: Payer: Self-pay | Admitting: Family Medicine

## 2017-08-17 ENCOUNTER — Ambulatory Visit (INDEPENDENT_AMBULATORY_CARE_PROVIDER_SITE_OTHER): Payer: Medicare Other | Admitting: Pharmacist

## 2017-08-17 DIAGNOSIS — I4819 Other persistent atrial fibrillation: Secondary | ICD-10-CM

## 2017-08-17 DIAGNOSIS — Z952 Presence of prosthetic heart valve: Secondary | ICD-10-CM

## 2017-08-17 DIAGNOSIS — I481 Persistent atrial fibrillation: Secondary | ICD-10-CM

## 2017-08-17 DIAGNOSIS — Z7901 Long term (current) use of anticoagulants: Secondary | ICD-10-CM | POA: Diagnosis not present

## 2017-08-17 LAB — POCT INR: INR: 2.2

## 2017-08-19 ENCOUNTER — Other Ambulatory Visit: Payer: Self-pay | Admitting: Family Medicine

## 2017-08-21 ENCOUNTER — Encounter: Payer: Self-pay | Admitting: Internal Medicine

## 2017-08-21 ENCOUNTER — Ambulatory Visit (INDEPENDENT_AMBULATORY_CARE_PROVIDER_SITE_OTHER): Payer: Medicare Other | Admitting: Internal Medicine

## 2017-08-21 VITALS — BP 124/70 | HR 75 | Temp 98.7°F | Ht 69.0 in | Wt 184.0 lb

## 2017-08-21 DIAGNOSIS — H00015 Hordeolum externum left lower eyelid: Secondary | ICD-10-CM

## 2017-08-21 DIAGNOSIS — I481 Persistent atrial fibrillation: Secondary | ICD-10-CM

## 2017-08-21 DIAGNOSIS — I4819 Other persistent atrial fibrillation: Secondary | ICD-10-CM

## 2017-08-21 MED ORDER — ERYTHROMYCIN 5 MG/GM OP OINT: 1.0000 "application " | TOPICAL_OINTMENT | Freq: Four times a day (QID) | OPHTHALMIC | 0 refills | Status: DC

## 2017-08-21 NOTE — Progress Notes (Signed)
Chief Complaint  Patient presents with  . Stye   F/u  1. Stye to left lower eyelid x 2-3 days tried warm compress w/o relief, no pain feels like something is in his eye. Tried otc drops for stye w/o relief    Review of Systems  HENT:       Stye to left lower eyelid   Eyes:       +stye to left lower eye lid   Respiratory: Negative for shortness of breath.   Cardiovascular: Negative for chest pain.   Past Medical History:  Diagnosis Date  . 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 Arthritis   . Chronic anticoagulation 06/2012  . Dialysis patient [facility]) 01 01 2008  . Dysrhythmia    A Fib  . Hx of colonoscopy    2009 by Dr. Laural Golden. Normal except for small submucosal lipoma. No evidence of polyps or colitis.  . Hyperlipidemia   . Hypertension   . Hypogonadism in male 07/25/2015  . 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  Marland Kitchen OSA (obstructive sleep apnea)    No CPAP  . Renal cell carcinoma ([place]) 08/04/2016   Past Surgical History:  Procedure Laterality Date  . 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 CAPD INSERTION N/A 02/19/2017   Procedure: LAPAROSCOPIC INSERTION CONTINUOUS AMBULATORY PERITONEAL DIALYSIS  (CAPD) CATHETER;  Surgeon: Algernon Huxley, MD;  Location: [facility] ORS;  Service: General;  Laterality: N/A;  . CORONARY ANGIOPLASTY WITH STENT PLACEMENT    . DG AV DIALYSIS  SHUNT ACCESS EXIST*L* OR     left arm  . EPIGASTRIC HERNIA REPAIR N/A 02/19/2017   Procedure: HERNIA REPAIR EPIGASTRIC ADULT;  Surgeon: Robert Bellow, MD;  Location: [facility] ORS;  Service: General;  Laterality: N/A;  . EYE SURGERY     bilateral cataract  . HERNIA REPAIR     UMBILICAL HERNIA  . HERNIA REPAIR  02/19/2017  . INNER EAR SURGERY     20 years ago  .  KIDNEY TRANSPLANT    . Peritoneal Dialysis access placement  02/19/2017  . TEE WITHOUT CARDIOVERSION N/A 07/21/2016   Procedure: TRANSESOPHAGEAL ECHOCARDIOGRAM (TEE);  Surgeon: Wellington Hampshire, MD;  Location: [facility] ORS;  Service: Cardiovascular;  Laterality: N/A;   Family History  Problem Relation Age of Onset  . Cancer Mother        pancreatic  . Heart disease Father   . Diabetes Father    Social History   Socioeconomic History  . Marital status: Divorced    Spouse name: Not on file  . Number of children: Not on file  . Years of education: Not on file  . Highest education level: Not on file  Occupational History  . Occupation: courier    Comment: [employer]  . Financial resource strain: Not on file  . Food insecurity:    Worry: Not on file    Inability: Not on file  . Transportation needs:    Medical: Not on file    Non-medical: Not on file  Tobacco Use  . Smoking status: Never Smoker  . Smokeless tobacco: Never Used  Substance and Sexual Activity  . Alcohol use: No  . Drug use: No  . Sexual  activity: Not Currently    Partners: Female  Lifestyle  . Physical activity:    Days per week: Not on file    Minutes per session: Not on file  . Stress: Not on file  Relationships  . Social connections:    Talks on phone: Not on file    Gets together: Not on file    Attends religious service: Not on file    Active member of club or organization: Not on file    Attends meetings of clubs or organizations: Not on file    Relationship status: Not on file  . Intimate partner violence:    Fear of current or ex partner: Not on file    Emotionally abused: Not on file    Physically abused: Not on file    Forced sexual activity: Not on file  Other Topics Concern  . Not on file  Social History Narrative   Single   1 daughter 78    Current Meds  Medication Sig  . acetaminophen (TYLENOL) 500 MG tablet Take 1,000 mg 2 (two) times daily as needed by mouth for  moderate pain.  Marland Kitchen allopurinol (ZYLOPRIM) 100 MG tablet Take 2 tablets (200 mg total) by mouth daily.  . calcitRIOL (ROCALTROL) 0.5 MCG capsule Take 0.5 mcg by mouth daily.  . Cholecalciferol (VITAMIN D3) 1000 units CAPS Take 2,000 Units every evening by mouth.  . cinacalcet (SENSIPAR) 60 MG tablet Take 30 mg by mouth daily. At dialysis  . Dextrose-Fructose-Sod Citrate (NAUZENE PO) Take 1-2 tablets 2 (two) times daily as needed by mouth (nausea).  . dicyclomine (BENTYL) 10 MG capsule Take 2 capsules (20 mg total) by mouth 3 (three) times daily before meals.  . DiphenhydrAMINE HCl, Sleep, 50 MG CAPS Take 50 mg at bedtime as needed by mouth (sleep).  . Evolocumab (REPATHA SURECLICK) 601 MG/ML SOAJ Inject 1 application into the skin every 14 (fourteen) days.  Marland Kitchen FLUoxetine (PROZAC) 10 MG capsule TAKE 1 CAPSULE BY MOUTH EVERY DAY  . IRON PO Take by mouth.  . lanthanum (FOSRENOL) 1000 MG chewable tablet Chew 2,000 mg 3 (three) times daily after meals by mouth.   . lidocaine-prilocaine (EMLA) cream Apply 1 application as needed topically (port access).  . metoprolol tartrate (LOPRESSOR) 100 MG tablet Take 1 tablet (100 mg total) by mouth 2 (two) times daily.  . Multiple Vitamin (MULTIVITAMIN WITH MINERALS) TABS tablet Take 1 tablet by mouth daily.  . nitroGLYCERIN (NITROSTAT) 0.4 MG SL tablet Place 1 tablet (0.4 mg total) under the tongue every 5 (five) minutes as needed for chest pain.  . Omega-3 Fatty Acids (FISH OIL) 1200 MG CAPS Take 1,200 mg 2 (two) times daily by mouth.  Marland Kitchen omeprazole (PRILOSEC) 20 MG capsule Take 1 capsule (20 mg total) by mouth daily.  . pravastatin (PRAVACHOL) 10 MG tablet TAKE ONE TABLET BY MOUTH ON MONDAY, WEDNESDAY, AND FRIDAY  . rOPINIRole (REQUIP) 0.25 MG tablet Take 0.25 mg at bedtime as needed by mouth (restless legs).   . warfarin (COUMADIN) 1 MG tablet Take 1 tablet (1 mg total) by mouth daily.   Allergies  Allergen Reactions  . Statins     Other reaction(s):  Arthralgias (intolerance), Myalgias (intolerance)  . Sertraline Rash   Recent Results (from the past 2160 hour(s))  POCT INR     Status: None   Collection Time: 05/25/17 12:00 AM  Result Value Ref Range   INR 2.4   POCT INR     Status: None  Collection Time: 06/01/17 12:00 AM  Result Value Ref Range   INR 3.1   POCT INR     Status: None   Collection Time: 06/08/17 12:00 AM  Result Value Ref Range   INR 2.0   POCT INR     Status: None   Collection Time: 06/15/17 12:00 AM  Result Value Ref Range   INR 2.1   POCT INR     Status: None   Collection Time: 06/22/17 12:00 AM  Result Value Ref Range   INR 2.8   POCT INR     Status: None   Collection Time: 06/29/17 12:00 AM  Result Value Ref Range   INR 2.0   POCT INR     Status: None   Collection Time: 07/06/17 12:00 AM  Result Value Ref Range   INR 5.1   POCT INR     Status: None   Collection Time: 07/13/17 12:00 AM  Result Value Ref Range   INR 3.0   POCT INR     Status: None   Collection Time: 07/20/17 12:00 AM  Result Value Ref Range   INR 3.9   POCT INR     Status: None   Collection Time: 07/27/17 12:00 AM  Result Value Ref Range   INR 3.9   POCT INR     Status: None   Collection Time: 08/03/17 12:00 AM  Result Value Ref Range   INR 1.9   POCT INR     Status: None   Collection Time: 08/10/17 12:00 AM  Result Value Ref Range   INR 2.3   Direct LDL     Status: None   Collection Time: 08/11/17  2:47 PM  Result Value Ref Range   Direct LDL 61.0 mg/dL    Comment: Optimal:  <100 mg/dLNear or Above Optimal:  100-129 mg/dLBorderline High:  130-159 mg/dLHigh:  160-189 mg/dLVery High:  >190 mg/dL  Hepatic function panel     Status: Abnormal   Collection Time: 08/11/17  2:47 PM  Result Value Ref Range   Total Bilirubin 0.4 0.2 - 1.2 mg/dL   Bilirubin, Direct 0.1 0.0 - 0.3 mg/dL   Alkaline Phosphatase 133 (H) 39 - 117 U/L   AST 15 0 - 37 U/L   ALT 10 0 - 53 U/L   Total Protein 6.8 6.0 - 8.3 g/dL   Albumin 3.1 (L)  3.5 - 5.2 g/dL  POCT INR     Status: None   Collection Time: 08/17/17 12:00 AM  Result Value Ref Range   INR 2.2    Objective  Body mass index is 27.17 kg/m. Wt Readings from Last 3 Encounters:  08/21/17 184 lb (83.5 kg)  08/11/17 183 lb 9.6 oz (83.3 kg)  06/25/17 191 lb 4 oz (86.8 kg)   Temp Readings from Last 3 Encounters:  08/21/17 98.7 F (37.1 C) (Oral)  08/11/17 98.3 F (36.8 C) (Oral)  06/25/17 98.3 F (36.8 C) (Oral)   BP Readings from Last 3 Encounters:  08/21/17 124/70  08/11/17 120/60  06/25/17 130/64   Pulse Readings from Last 3 Encounters:  08/21/17 75  08/11/17 72  06/25/17 63    Physical Exam  Constitutional: He is oriented to person, place, and time. Vital signs are normal. He appears well-developed and well-nourished. He is cooperative.  HENT:  Head: Normocephalic and atraumatic.  Mouth/Throat: Oropharynx is clear and moist and mucous membranes are normal.  Eyes: Pupils are equal, round, and reactive to light. Conjunctivae are normal.  Cardiovascular: Normal rate. An irregular rhythm present.  In Afib  Pulmonary/Chest: Effort normal and breath sounds normal.  Neurological: He is alert and oriented to person, place, and time. Gait normal.  Skin: Skin is warm, dry and intact.  Fistula to left forearm   Psychiatric: He has a normal mood and affect. His speech is normal and behavior is normal. Judgment and thought content normal. Cognition and memory are normal.  Nursing note and vitals reviewed.   Assessment   1. Stye left lower lid  Plan  1. Warm compress, E mycin qid x 1 week  Call back in 2 weeks if not better and will consider doxy orally   Provider: Dr. Olivia Mackie McLean-Scocuz[facility]

## 2017-08-21 NOTE — Patient Instructions (Addendum)
Call back in 2 weeks if not better   Stye A stye is a bump on your eyelid caused by a bacterial infection. A stye can form inside the eyelid (internal stye) or outside the eyelid (external stye). An internal stye may be caused by an infected oil-producing gland inside your eyelid. An external stye may be caused by an infection at the base of your eyelash (hair follicle). Styes are very common. Anyone can get them at any age. They usually occur in just one eye, but you may have more than one in either eye. What are the causes? The infection is almost always caused by bacteria called Staphylococcus aureus. This is a common type of bacteria that lives on your skin. What increases the risk? You may be at higher risk for a stye if you have had one before. You may also be at higher risk if you have:  Diabetes.  Long-term illness.  Long-term eye redness.  A skin condition called seborrhea.  High fat levels in your blood (lipids).  What are the signs or symptoms? Eyelid pain is the most common symptom of a stye. Internal styes are more painful than external styes. Other signs and symptoms may include:  Painful swelling of your eyelid.  A scratchy feeling in your eye.  Tearing and redness of your eye.  Pus draining from the stye.  How is this diagnosed? Your health care provider may be able to diagnose a stye just by examining your eye. The health care provider may also check to make sure:  You do not have a fever or other signs of a more serious infection.  The infection has not spread to other parts of your eye or areas around your eye.  How is this treated? Most styes will clear up in a few days without treatment. In some cases, you may need to use antibiotic drops or ointment to prevent infection. Your health care provider may have to drain the stye surgically if your stye is:  Large.  Causing a lot of pain.  Interfering with your vision.  This can be done using a thin blade  or a needle. Follow these instructions at home:  Take medicines only as directed by your health care provider.  Apply a clean, warm compress to your eye for 10 minutes, 4 times a day.  Do not wear contact lenses or eye makeup until your stye has healed.  Do not try to pop or drain the stye. Contact a health care provider if:  You have chills or a fever.  Your stye does not go away after several days.  Your stye affects your vision.  Your eyeball becomes swollen, red, or painful. This information is not intended to replace advice given to you by your health care provider. Make sure you discuss any questions you have with your health care provider. Document Released: 01/01/2005 Document Revised: 11/18/2015 Document Reviewed: 07/08/2013 Elsevier Interactive Patient Education  [employer].

## 2017-08-24 ENCOUNTER — Ambulatory Visit (INDEPENDENT_AMBULATORY_CARE_PROVIDER_SITE_OTHER): Payer: Medicare Other | Admitting: Pharmacist

## 2017-08-24 DIAGNOSIS — I481 Persistent atrial fibrillation: Secondary | ICD-10-CM | POA: Diagnosis not present

## 2017-08-24 DIAGNOSIS — Z952 Presence of prosthetic heart valve: Secondary | ICD-10-CM

## 2017-08-24 DIAGNOSIS — Z7901 Long term (current) use of anticoagulants: Secondary | ICD-10-CM | POA: Diagnosis not present

## 2017-08-24 DIAGNOSIS — I4819 Other persistent atrial fibrillation: Secondary | ICD-10-CM

## 2017-08-24 LAB — POCT INR: INR: 3 (ref 2.0–3.0)

## 2017-08-24 NOTE — Progress Notes (Signed)
I have reviewed the above note and agree. I was available to the pharmacist for consultation.  Eric Sonnenberg, MD  

## 2017-08-25 DIAGNOSIS — N186 End stage renal disease: Secondary | ICD-10-CM | POA: Diagnosis not present

## 2017-08-25 DIAGNOSIS — Z79899 Other long term (current) drug therapy: Secondary | ICD-10-CM | POA: Diagnosis not present

## 2017-08-25 DIAGNOSIS — N2589 Other disorders resulting from impaired renal tubular function: Secondary | ICD-10-CM | POA: Diagnosis not present

## 2017-08-25 DIAGNOSIS — D631 Anemia in chronic kidney disease: Secondary | ICD-10-CM | POA: Diagnosis not present

## 2017-08-25 DIAGNOSIS — D509 Iron deficiency anemia, unspecified: Secondary | ICD-10-CM | POA: Diagnosis not present

## 2017-08-25 DIAGNOSIS — N2581 Secondary hyperparathyroidism of renal origin: Secondary | ICD-10-CM | POA: Diagnosis not present

## 2017-08-26 ENCOUNTER — Other Ambulatory Visit: Payer: Medicare Other

## 2017-08-26 DIAGNOSIS — Z125 Encounter for screening for malignant neoplasm of prostate: Secondary | ICD-10-CM | POA: Diagnosis not present

## 2017-08-26 DIAGNOSIS — N400 Enlarged prostate without lower urinary tract symptoms: Secondary | ICD-10-CM

## 2017-08-27 LAB — PSA: Prostate Specific Ag, Serum: 3.4 ng/mL (ref 0.0–4.0)

## 2017-08-31 DIAGNOSIS — D509 Iron deficiency anemia, unspecified: Secondary | ICD-10-CM | POA: Diagnosis not present

## 2017-08-31 DIAGNOSIS — N2581 Secondary hyperparathyroidism of renal origin: Secondary | ICD-10-CM | POA: Diagnosis not present

## 2017-08-31 DIAGNOSIS — Z79899 Other long term (current) drug therapy: Secondary | ICD-10-CM | POA: Diagnosis not present

## 2017-08-31 DIAGNOSIS — D631 Anemia in chronic kidney disease: Secondary | ICD-10-CM | POA: Diagnosis not present

## 2017-08-31 DIAGNOSIS — N186 End stage renal disease: Secondary | ICD-10-CM | POA: Diagnosis not present

## 2017-08-31 DIAGNOSIS — N2589 Other disorders resulting from impaired renal tubular function: Secondary | ICD-10-CM | POA: Diagnosis not present

## 2017-09-01 ENCOUNTER — Telehealth: Payer: Self-pay | Admitting: Pharmacist

## 2017-09-01 ENCOUNTER — Ambulatory Visit (INDEPENDENT_AMBULATORY_CARE_PROVIDER_SITE_OTHER): Payer: Medicare Other | Admitting: Pharmacist

## 2017-09-01 DIAGNOSIS — I4819 Other persistent atrial fibrillation: Secondary | ICD-10-CM

## 2017-09-01 DIAGNOSIS — Z952 Presence of prosthetic heart valve: Secondary | ICD-10-CM

## 2017-09-01 DIAGNOSIS — N186 End stage renal disease: Secondary | ICD-10-CM | POA: Diagnosis not present

## 2017-09-01 DIAGNOSIS — I481 Persistent atrial fibrillation: Secondary | ICD-10-CM | POA: Diagnosis not present

## 2017-09-01 DIAGNOSIS — N2589 Other disorders resulting from impaired renal tubular function: Secondary | ICD-10-CM | POA: Diagnosis not present

## 2017-09-01 DIAGNOSIS — N2581 Secondary hyperparathyroidism of renal origin: Secondary | ICD-10-CM | POA: Diagnosis not present

## 2017-09-01 DIAGNOSIS — D509 Iron deficiency anemia, unspecified: Secondary | ICD-10-CM | POA: Diagnosis not present

## 2017-09-01 DIAGNOSIS — D631 Anemia in chronic kidney disease: Secondary | ICD-10-CM | POA: Diagnosis not present

## 2017-09-01 DIAGNOSIS — Z79899 Other long term (current) drug therapy: Secondary | ICD-10-CM | POA: Diagnosis not present

## 2017-09-01 DIAGNOSIS — Z7901 Long term (current) use of anticoagulants: Secondary | ICD-10-CM

## 2017-09-01 LAB — POCT INR: INR: 3.4 — AB (ref 2.0–3.0)

## 2017-09-01 NOTE — Progress Notes (Signed)
I have reviewed the above note and agree. I was available to the pharmacist for consultation.  Brentlee Sciara, MD  

## 2017-09-01 NOTE — Telephone Encounter (Signed)
LVM stating INR was 3.4 on home machine.

## 2017-09-02 DIAGNOSIS — N186 End stage renal disease: Secondary | ICD-10-CM | POA: Diagnosis not present

## 2017-09-02 DIAGNOSIS — N2589 Other disorders resulting from impaired renal tubular function: Secondary | ICD-10-CM | POA: Diagnosis not present

## 2017-09-02 DIAGNOSIS — D631 Anemia in chronic kidney disease: Secondary | ICD-10-CM | POA: Diagnosis not present

## 2017-09-02 DIAGNOSIS — N2581 Secondary hyperparathyroidism of renal origin: Secondary | ICD-10-CM | POA: Diagnosis not present

## 2017-09-02 DIAGNOSIS — Z79899 Other long term (current) drug therapy: Secondary | ICD-10-CM | POA: Diagnosis not present

## 2017-09-02 DIAGNOSIS — D509 Iron deficiency anemia, unspecified: Secondary | ICD-10-CM | POA: Diagnosis not present

## 2017-09-03 ENCOUNTER — Ambulatory Visit: Payer: Medicare Other

## 2017-09-03 DIAGNOSIS — N2581 Secondary hyperparathyroidism of renal origin: Secondary | ICD-10-CM | POA: Diagnosis not present

## 2017-09-03 DIAGNOSIS — D631 Anemia in chronic kidney disease: Secondary | ICD-10-CM | POA: Diagnosis not present

## 2017-09-03 DIAGNOSIS — N2589 Other disorders resulting from impaired renal tubular function: Secondary | ICD-10-CM | POA: Diagnosis not present

## 2017-09-03 DIAGNOSIS — Z79899 Other long term (current) drug therapy: Secondary | ICD-10-CM | POA: Diagnosis not present

## 2017-09-03 DIAGNOSIS — D509 Iron deficiency anemia, unspecified: Secondary | ICD-10-CM | POA: Diagnosis not present

## 2017-09-03 DIAGNOSIS — N186 End stage renal disease: Secondary | ICD-10-CM | POA: Diagnosis not present

## 2017-09-04 DIAGNOSIS — D509 Iron deficiency anemia, unspecified: Secondary | ICD-10-CM | POA: Diagnosis not present

## 2017-09-04 DIAGNOSIS — Z992 Dependence on renal dialysis: Secondary | ICD-10-CM | POA: Diagnosis not present

## 2017-09-04 DIAGNOSIS — N2589 Other disorders resulting from impaired renal tubular function: Secondary | ICD-10-CM | POA: Diagnosis not present

## 2017-09-04 DIAGNOSIS — Z79899 Other long term (current) drug therapy: Secondary | ICD-10-CM | POA: Diagnosis not present

## 2017-09-04 DIAGNOSIS — D631 Anemia in chronic kidney disease: Secondary | ICD-10-CM | POA: Diagnosis not present

## 2017-09-04 DIAGNOSIS — N186 End stage renal disease: Secondary | ICD-10-CM | POA: Diagnosis not present

## 2017-09-04 DIAGNOSIS — T8610 Unspecified complication of kidney transplant: Secondary | ICD-10-CM | POA: Diagnosis not present

## 2017-09-04 DIAGNOSIS — N2581 Secondary hyperparathyroidism of renal origin: Secondary | ICD-10-CM | POA: Diagnosis not present

## 2017-09-05 DIAGNOSIS — N186 End stage renal disease: Secondary | ICD-10-CM | POA: Diagnosis not present

## 2017-09-05 DIAGNOSIS — N2589 Other disorders resulting from impaired renal tubular function: Secondary | ICD-10-CM | POA: Diagnosis not present

## 2017-09-05 DIAGNOSIS — N2581 Secondary hyperparathyroidism of renal origin: Secondary | ICD-10-CM | POA: Diagnosis not present

## 2017-09-05 DIAGNOSIS — D509 Iron deficiency anemia, unspecified: Secondary | ICD-10-CM | POA: Diagnosis not present

## 2017-09-05 DIAGNOSIS — D631 Anemia in chronic kidney disease: Secondary | ICD-10-CM | POA: Diagnosis not present

## 2017-09-05 DIAGNOSIS — K769 Liver disease, unspecified: Secondary | ICD-10-CM | POA: Diagnosis not present

## 2017-09-06 DIAGNOSIS — N186 End stage renal disease: Secondary | ICD-10-CM | POA: Diagnosis not present

## 2017-09-06 DIAGNOSIS — N2589 Other disorders resulting from impaired renal tubular function: Secondary | ICD-10-CM | POA: Diagnosis not present

## 2017-09-06 DIAGNOSIS — N2581 Secondary hyperparathyroidism of renal origin: Secondary | ICD-10-CM | POA: Diagnosis not present

## 2017-09-06 DIAGNOSIS — D631 Anemia in chronic kidney disease: Secondary | ICD-10-CM | POA: Diagnosis not present

## 2017-09-06 DIAGNOSIS — K769 Liver disease, unspecified: Secondary | ICD-10-CM | POA: Diagnosis not present

## 2017-09-06 DIAGNOSIS — D509 Iron deficiency anemia, unspecified: Secondary | ICD-10-CM | POA: Diagnosis not present

## 2017-09-07 ENCOUNTER — Ambulatory Visit (INDEPENDENT_AMBULATORY_CARE_PROVIDER_SITE_OTHER): Payer: Medicare Other | Admitting: Pharmacist

## 2017-09-07 DIAGNOSIS — N2589 Other disorders resulting from impaired renal tubular function: Secondary | ICD-10-CM | POA: Diagnosis not present

## 2017-09-07 DIAGNOSIS — N186 End stage renal disease: Secondary | ICD-10-CM | POA: Diagnosis not present

## 2017-09-07 DIAGNOSIS — N2581 Secondary hyperparathyroidism of renal origin: Secondary | ICD-10-CM | POA: Diagnosis not present

## 2017-09-07 DIAGNOSIS — Z7901 Long term (current) use of anticoagulants: Secondary | ICD-10-CM | POA: Diagnosis not present

## 2017-09-07 DIAGNOSIS — I481 Persistent atrial fibrillation: Secondary | ICD-10-CM

## 2017-09-07 DIAGNOSIS — E7849 Other hyperlipidemia: Secondary | ICD-10-CM | POA: Diagnosis not present

## 2017-09-07 DIAGNOSIS — I4819 Other persistent atrial fibrillation: Secondary | ICD-10-CM

## 2017-09-07 DIAGNOSIS — K769 Liver disease, unspecified: Secondary | ICD-10-CM | POA: Diagnosis not present

## 2017-09-07 DIAGNOSIS — D631 Anemia in chronic kidney disease: Secondary | ICD-10-CM | POA: Diagnosis not present

## 2017-09-07 DIAGNOSIS — Z952 Presence of prosthetic heart valve: Secondary | ICD-10-CM | POA: Diagnosis not present

## 2017-09-07 DIAGNOSIS — D509 Iron deficiency anemia, unspecified: Secondary | ICD-10-CM | POA: Diagnosis not present

## 2017-09-07 DIAGNOSIS — I482 Chronic atrial fibrillation: Secondary | ICD-10-CM | POA: Diagnosis not present

## 2017-09-07 LAB — POCT INR: INR: 3.4 — AB (ref 2.0–3.0)

## 2017-09-07 NOTE — Progress Notes (Signed)
I have reviewed the above note and agree. I was available to the pharmacist for consultation.  Tamme Mozingo, MD  

## 2017-09-08 DIAGNOSIS — N186 End stage renal disease: Secondary | ICD-10-CM | POA: Diagnosis not present

## 2017-09-08 DIAGNOSIS — K769 Liver disease, unspecified: Secondary | ICD-10-CM | POA: Diagnosis not present

## 2017-09-08 DIAGNOSIS — D631 Anemia in chronic kidney disease: Secondary | ICD-10-CM | POA: Diagnosis not present

## 2017-09-08 DIAGNOSIS — N2589 Other disorders resulting from impaired renal tubular function: Secondary | ICD-10-CM | POA: Diagnosis not present

## 2017-09-08 DIAGNOSIS — D509 Iron deficiency anemia, unspecified: Secondary | ICD-10-CM | POA: Diagnosis not present

## 2017-09-08 DIAGNOSIS — N2581 Secondary hyperparathyroidism of renal origin: Secondary | ICD-10-CM | POA: Diagnosis not present

## 2017-09-09 DIAGNOSIS — N2589 Other disorders resulting from impaired renal tubular function: Secondary | ICD-10-CM | POA: Diagnosis not present

## 2017-09-09 DIAGNOSIS — N186 End stage renal disease: Secondary | ICD-10-CM | POA: Diagnosis not present

## 2017-09-09 DIAGNOSIS — D509 Iron deficiency anemia, unspecified: Secondary | ICD-10-CM | POA: Diagnosis not present

## 2017-09-09 DIAGNOSIS — K769 Liver disease, unspecified: Secondary | ICD-10-CM | POA: Diagnosis not present

## 2017-09-09 DIAGNOSIS — D631 Anemia in chronic kidney disease: Secondary | ICD-10-CM | POA: Diagnosis not present

## 2017-09-09 DIAGNOSIS — N2581 Secondary hyperparathyroidism of renal origin: Secondary | ICD-10-CM | POA: Diagnosis not present

## 2017-09-10 DIAGNOSIS — D631 Anemia in chronic kidney disease: Secondary | ICD-10-CM | POA: Diagnosis not present

## 2017-09-10 DIAGNOSIS — D509 Iron deficiency anemia, unspecified: Secondary | ICD-10-CM | POA: Diagnosis not present

## 2017-09-10 DIAGNOSIS — N2589 Other disorders resulting from impaired renal tubular function: Secondary | ICD-10-CM | POA: Diagnosis not present

## 2017-09-10 DIAGNOSIS — N2581 Secondary hyperparathyroidism of renal origin: Secondary | ICD-10-CM | POA: Diagnosis not present

## 2017-09-10 DIAGNOSIS — N186 End stage renal disease: Secondary | ICD-10-CM | POA: Diagnosis not present

## 2017-09-10 DIAGNOSIS — K769 Liver disease, unspecified: Secondary | ICD-10-CM | POA: Diagnosis not present

## 2017-09-11 DIAGNOSIS — D509 Iron deficiency anemia, unspecified: Secondary | ICD-10-CM | POA: Diagnosis not present

## 2017-09-11 DIAGNOSIS — N2581 Secondary hyperparathyroidism of renal origin: Secondary | ICD-10-CM | POA: Diagnosis not present

## 2017-09-11 DIAGNOSIS — K769 Liver disease, unspecified: Secondary | ICD-10-CM | POA: Diagnosis not present

## 2017-09-11 DIAGNOSIS — N2589 Other disorders resulting from impaired renal tubular function: Secondary | ICD-10-CM | POA: Diagnosis not present

## 2017-09-11 DIAGNOSIS — D631 Anemia in chronic kidney disease: Secondary | ICD-10-CM | POA: Diagnosis not present

## 2017-09-11 DIAGNOSIS — N186 End stage renal disease: Secondary | ICD-10-CM | POA: Diagnosis not present

## 2017-09-12 DIAGNOSIS — N186 End stage renal disease: Secondary | ICD-10-CM | POA: Diagnosis not present

## 2017-09-12 DIAGNOSIS — D631 Anemia in chronic kidney disease: Secondary | ICD-10-CM | POA: Diagnosis not present

## 2017-09-12 DIAGNOSIS — D509 Iron deficiency anemia, unspecified: Secondary | ICD-10-CM | POA: Diagnosis not present

## 2017-09-12 DIAGNOSIS — N2589 Other disorders resulting from impaired renal tubular function: Secondary | ICD-10-CM | POA: Diagnosis not present

## 2017-09-12 DIAGNOSIS — K769 Liver disease, unspecified: Secondary | ICD-10-CM | POA: Diagnosis not present

## 2017-09-12 DIAGNOSIS — N2581 Secondary hyperparathyroidism of renal origin: Secondary | ICD-10-CM | POA: Diagnosis not present

## 2017-09-13 DIAGNOSIS — N2589 Other disorders resulting from impaired renal tubular function: Secondary | ICD-10-CM | POA: Diagnosis not present

## 2017-09-13 DIAGNOSIS — D631 Anemia in chronic kidney disease: Secondary | ICD-10-CM | POA: Diagnosis not present

## 2017-09-13 DIAGNOSIS — K769 Liver disease, unspecified: Secondary | ICD-10-CM | POA: Diagnosis not present

## 2017-09-13 DIAGNOSIS — N186 End stage renal disease: Secondary | ICD-10-CM | POA: Diagnosis not present

## 2017-09-13 DIAGNOSIS — D509 Iron deficiency anemia, unspecified: Secondary | ICD-10-CM | POA: Diagnosis not present

## 2017-09-13 DIAGNOSIS — N2581 Secondary hyperparathyroidism of renal origin: Secondary | ICD-10-CM | POA: Diagnosis not present

## 2017-09-14 ENCOUNTER — Ambulatory Visit (INDEPENDENT_AMBULATORY_CARE_PROVIDER_SITE_OTHER): Payer: Medicare Other | Admitting: Pharmacist

## 2017-09-14 DIAGNOSIS — K769 Liver disease, unspecified: Secondary | ICD-10-CM | POA: Diagnosis not present

## 2017-09-14 DIAGNOSIS — Z952 Presence of prosthetic heart valve: Secondary | ICD-10-CM | POA: Diagnosis not present

## 2017-09-14 DIAGNOSIS — D509 Iron deficiency anemia, unspecified: Secondary | ICD-10-CM | POA: Diagnosis not present

## 2017-09-14 DIAGNOSIS — I481 Persistent atrial fibrillation: Secondary | ICD-10-CM | POA: Diagnosis not present

## 2017-09-14 DIAGNOSIS — D631 Anemia in chronic kidney disease: Secondary | ICD-10-CM | POA: Diagnosis not present

## 2017-09-14 DIAGNOSIS — N2581 Secondary hyperparathyroidism of renal origin: Secondary | ICD-10-CM | POA: Diagnosis not present

## 2017-09-14 DIAGNOSIS — N186 End stage renal disease: Secondary | ICD-10-CM | POA: Diagnosis not present

## 2017-09-14 DIAGNOSIS — Z7901 Long term (current) use of anticoagulants: Secondary | ICD-10-CM | POA: Diagnosis not present

## 2017-09-14 DIAGNOSIS — I4819 Other persistent atrial fibrillation: Secondary | ICD-10-CM

## 2017-09-14 DIAGNOSIS — N2589 Other disorders resulting from impaired renal tubular function: Secondary | ICD-10-CM | POA: Diagnosis not present

## 2017-09-14 LAB — POCT INR: INR: 3.3 — AB (ref 2.0–3.0)

## 2017-09-14 NOTE — Progress Notes (Signed)
I have reviewed the above note and agree. I was available to the pharmacist for consultation.  Samar Venneman, MD  

## 2017-09-15 DIAGNOSIS — N2589 Other disorders resulting from impaired renal tubular function: Secondary | ICD-10-CM | POA: Diagnosis not present

## 2017-09-15 DIAGNOSIS — N2581 Secondary hyperparathyroidism of renal origin: Secondary | ICD-10-CM | POA: Diagnosis not present

## 2017-09-15 DIAGNOSIS — D631 Anemia in chronic kidney disease: Secondary | ICD-10-CM | POA: Diagnosis not present

## 2017-09-15 DIAGNOSIS — K769 Liver disease, unspecified: Secondary | ICD-10-CM | POA: Diagnosis not present

## 2017-09-15 DIAGNOSIS — D509 Iron deficiency anemia, unspecified: Secondary | ICD-10-CM | POA: Diagnosis not present

## 2017-09-15 DIAGNOSIS — N186 End stage renal disease: Secondary | ICD-10-CM | POA: Diagnosis not present

## 2017-09-16 DIAGNOSIS — N186 End stage renal disease: Secondary | ICD-10-CM | POA: Diagnosis not present

## 2017-09-16 DIAGNOSIS — D509 Iron deficiency anemia, unspecified: Secondary | ICD-10-CM | POA: Diagnosis not present

## 2017-09-16 DIAGNOSIS — N2581 Secondary hyperparathyroidism of renal origin: Secondary | ICD-10-CM | POA: Diagnosis not present

## 2017-09-16 DIAGNOSIS — D631 Anemia in chronic kidney disease: Secondary | ICD-10-CM | POA: Diagnosis not present

## 2017-09-16 DIAGNOSIS — K769 Liver disease, unspecified: Secondary | ICD-10-CM | POA: Diagnosis not present

## 2017-09-16 DIAGNOSIS — N2589 Other disorders resulting from impaired renal tubular function: Secondary | ICD-10-CM | POA: Diagnosis not present

## 2017-09-17 DIAGNOSIS — D509 Iron deficiency anemia, unspecified: Secondary | ICD-10-CM | POA: Diagnosis not present

## 2017-09-17 DIAGNOSIS — N2589 Other disorders resulting from impaired renal tubular function: Secondary | ICD-10-CM | POA: Diagnosis not present

## 2017-09-17 DIAGNOSIS — K769 Liver disease, unspecified: Secondary | ICD-10-CM | POA: Diagnosis not present

## 2017-09-17 DIAGNOSIS — N186 End stage renal disease: Secondary | ICD-10-CM | POA: Diagnosis not present

## 2017-09-17 DIAGNOSIS — D631 Anemia in chronic kidney disease: Secondary | ICD-10-CM | POA: Diagnosis not present

## 2017-09-17 DIAGNOSIS — N2581 Secondary hyperparathyroidism of renal origin: Secondary | ICD-10-CM | POA: Diagnosis not present

## 2017-09-18 DIAGNOSIS — N186 End stage renal disease: Secondary | ICD-10-CM | POA: Diagnosis not present

## 2017-09-18 DIAGNOSIS — N2589 Other disorders resulting from impaired renal tubular function: Secondary | ICD-10-CM | POA: Diagnosis not present

## 2017-09-18 DIAGNOSIS — D509 Iron deficiency anemia, unspecified: Secondary | ICD-10-CM | POA: Diagnosis not present

## 2017-09-18 DIAGNOSIS — K769 Liver disease, unspecified: Secondary | ICD-10-CM | POA: Diagnosis not present

## 2017-09-18 DIAGNOSIS — N2581 Secondary hyperparathyroidism of renal origin: Secondary | ICD-10-CM | POA: Diagnosis not present

## 2017-09-18 DIAGNOSIS — D631 Anemia in chronic kidney disease: Secondary | ICD-10-CM | POA: Diagnosis not present

## 2017-09-19 DIAGNOSIS — D631 Anemia in chronic kidney disease: Secondary | ICD-10-CM | POA: Diagnosis not present

## 2017-09-19 DIAGNOSIS — N2589 Other disorders resulting from impaired renal tubular function: Secondary | ICD-10-CM | POA: Diagnosis not present

## 2017-09-19 DIAGNOSIS — N2581 Secondary hyperparathyroidism of renal origin: Secondary | ICD-10-CM | POA: Diagnosis not present

## 2017-09-19 DIAGNOSIS — D509 Iron deficiency anemia, unspecified: Secondary | ICD-10-CM | POA: Diagnosis not present

## 2017-09-19 DIAGNOSIS — K769 Liver disease, unspecified: Secondary | ICD-10-CM | POA: Diagnosis not present

## 2017-09-19 DIAGNOSIS — N186 End stage renal disease: Secondary | ICD-10-CM | POA: Diagnosis not present

## 2017-09-20 DIAGNOSIS — N186 End stage renal disease: Secondary | ICD-10-CM | POA: Diagnosis not present

## 2017-09-20 DIAGNOSIS — N2581 Secondary hyperparathyroidism of renal origin: Secondary | ICD-10-CM | POA: Diagnosis not present

## 2017-09-20 DIAGNOSIS — D631 Anemia in chronic kidney disease: Secondary | ICD-10-CM | POA: Diagnosis not present

## 2017-09-20 DIAGNOSIS — D509 Iron deficiency anemia, unspecified: Secondary | ICD-10-CM | POA: Diagnosis not present

## 2017-09-20 DIAGNOSIS — K769 Liver disease, unspecified: Secondary | ICD-10-CM | POA: Diagnosis not present

## 2017-09-20 DIAGNOSIS — N2589 Other disorders resulting from impaired renal tubular function: Secondary | ICD-10-CM | POA: Diagnosis not present

## 2017-09-21 ENCOUNTER — Ambulatory Visit (INDEPENDENT_AMBULATORY_CARE_PROVIDER_SITE_OTHER): Payer: Medicare Other | Admitting: Pharmacist

## 2017-09-21 DIAGNOSIS — I481 Persistent atrial fibrillation: Secondary | ICD-10-CM | POA: Diagnosis not present

## 2017-09-21 DIAGNOSIS — N186 End stage renal disease: Secondary | ICD-10-CM | POA: Diagnosis not present

## 2017-09-21 DIAGNOSIS — N2581 Secondary hyperparathyroidism of renal origin: Secondary | ICD-10-CM | POA: Diagnosis not present

## 2017-09-21 DIAGNOSIS — Z952 Presence of prosthetic heart valve: Secondary | ICD-10-CM

## 2017-09-21 DIAGNOSIS — D631 Anemia in chronic kidney disease: Secondary | ICD-10-CM | POA: Diagnosis not present

## 2017-09-21 DIAGNOSIS — N2589 Other disorders resulting from impaired renal tubular function: Secondary | ICD-10-CM | POA: Diagnosis not present

## 2017-09-21 DIAGNOSIS — D509 Iron deficiency anemia, unspecified: Secondary | ICD-10-CM | POA: Diagnosis not present

## 2017-09-21 DIAGNOSIS — I4819 Other persistent atrial fibrillation: Secondary | ICD-10-CM

## 2017-09-21 DIAGNOSIS — K769 Liver disease, unspecified: Secondary | ICD-10-CM | POA: Diagnosis not present

## 2017-09-21 DIAGNOSIS — Z7901 Long term (current) use of anticoagulants: Secondary | ICD-10-CM | POA: Diagnosis not present

## 2017-09-21 LAB — POCT INR: INR: 3.2 — AB (ref 2.0–3.0)

## 2017-09-22 DIAGNOSIS — N2581 Secondary hyperparathyroidism of renal origin: Secondary | ICD-10-CM | POA: Diagnosis not present

## 2017-09-22 DIAGNOSIS — K769 Liver disease, unspecified: Secondary | ICD-10-CM | POA: Diagnosis not present

## 2017-09-22 DIAGNOSIS — E663 Overweight: Secondary | ICD-10-CM | POA: Diagnosis not present

## 2017-09-22 DIAGNOSIS — M8580 Other specified disorders of bone density and structure, unspecified site: Secondary | ICD-10-CM | POA: Diagnosis not present

## 2017-09-22 DIAGNOSIS — N186 End stage renal disease: Secondary | ICD-10-CM | POA: Diagnosis not present

## 2017-09-22 DIAGNOSIS — D631 Anemia in chronic kidney disease: Secondary | ICD-10-CM | POA: Diagnosis not present

## 2017-09-22 DIAGNOSIS — Z6826 Body mass index (BMI) 26.0-26.9, adult: Secondary | ICD-10-CM | POA: Diagnosis not present

## 2017-09-22 DIAGNOSIS — N2589 Other disorders resulting from impaired renal tubular function: Secondary | ICD-10-CM | POA: Diagnosis not present

## 2017-09-22 DIAGNOSIS — E790 Hyperuricemia without signs of inflammatory arthritis and tophaceous disease: Secondary | ICD-10-CM | POA: Diagnosis not present

## 2017-09-22 DIAGNOSIS — M150 Primary generalized (osteo)arthritis: Secondary | ICD-10-CM | POA: Diagnosis not present

## 2017-09-22 DIAGNOSIS — D509 Iron deficiency anemia, unspecified: Secondary | ICD-10-CM | POA: Diagnosis not present

## 2017-09-22 DIAGNOSIS — M2550 Pain in unspecified joint: Secondary | ICD-10-CM | POA: Diagnosis not present

## 2017-09-23 DIAGNOSIS — D509 Iron deficiency anemia, unspecified: Secondary | ICD-10-CM | POA: Diagnosis not present

## 2017-09-23 DIAGNOSIS — D631 Anemia in chronic kidney disease: Secondary | ICD-10-CM | POA: Diagnosis not present

## 2017-09-23 DIAGNOSIS — K769 Liver disease, unspecified: Secondary | ICD-10-CM | POA: Diagnosis not present

## 2017-09-23 DIAGNOSIS — N186 End stage renal disease: Secondary | ICD-10-CM | POA: Diagnosis not present

## 2017-09-23 DIAGNOSIS — N2581 Secondary hyperparathyroidism of renal origin: Secondary | ICD-10-CM | POA: Diagnosis not present

## 2017-09-23 DIAGNOSIS — N2589 Other disorders resulting from impaired renal tubular function: Secondary | ICD-10-CM | POA: Diagnosis not present

## 2017-09-23 NOTE — Progress Notes (Signed)
I have reviewed the above note and agree. I was available to the pharmacist for consultation.  Kizer Nobbe, MD  

## 2017-09-24 ENCOUNTER — Ambulatory Visit: Payer: Medicare Other

## 2017-09-24 DIAGNOSIS — D631 Anemia in chronic kidney disease: Secondary | ICD-10-CM | POA: Diagnosis not present

## 2017-09-24 DIAGNOSIS — N2581 Secondary hyperparathyroidism of renal origin: Secondary | ICD-10-CM | POA: Diagnosis not present

## 2017-09-24 DIAGNOSIS — D509 Iron deficiency anemia, unspecified: Secondary | ICD-10-CM | POA: Diagnosis not present

## 2017-09-24 DIAGNOSIS — K769 Liver disease, unspecified: Secondary | ICD-10-CM | POA: Diagnosis not present

## 2017-09-24 DIAGNOSIS — N186 End stage renal disease: Secondary | ICD-10-CM | POA: Diagnosis not present

## 2017-09-24 DIAGNOSIS — N2589 Other disorders resulting from impaired renal tubular function: Secondary | ICD-10-CM | POA: Diagnosis not present

## 2017-09-25 DIAGNOSIS — N2581 Secondary hyperparathyroidism of renal origin: Secondary | ICD-10-CM | POA: Diagnosis not present

## 2017-09-25 DIAGNOSIS — N186 End stage renal disease: Secondary | ICD-10-CM | POA: Diagnosis not present

## 2017-09-25 DIAGNOSIS — D631 Anemia in chronic kidney disease: Secondary | ICD-10-CM | POA: Diagnosis not present

## 2017-09-25 DIAGNOSIS — D509 Iron deficiency anemia, unspecified: Secondary | ICD-10-CM | POA: Diagnosis not present

## 2017-09-25 DIAGNOSIS — K769 Liver disease, unspecified: Secondary | ICD-10-CM | POA: Diagnosis not present

## 2017-09-25 DIAGNOSIS — N2589 Other disorders resulting from impaired renal tubular function: Secondary | ICD-10-CM | POA: Diagnosis not present

## 2017-09-26 DIAGNOSIS — N2581 Secondary hyperparathyroidism of renal origin: Secondary | ICD-10-CM | POA: Diagnosis not present

## 2017-09-26 DIAGNOSIS — K769 Liver disease, unspecified: Secondary | ICD-10-CM | POA: Diagnosis not present

## 2017-09-26 DIAGNOSIS — D509 Iron deficiency anemia, unspecified: Secondary | ICD-10-CM | POA: Diagnosis not present

## 2017-09-26 DIAGNOSIS — D631 Anemia in chronic kidney disease: Secondary | ICD-10-CM | POA: Diagnosis not present

## 2017-09-26 DIAGNOSIS — N186 End stage renal disease: Secondary | ICD-10-CM | POA: Diagnosis not present

## 2017-09-26 DIAGNOSIS — N2589 Other disorders resulting from impaired renal tubular function: Secondary | ICD-10-CM | POA: Diagnosis not present

## 2017-09-27 DIAGNOSIS — N2589 Other disorders resulting from impaired renal tubular function: Secondary | ICD-10-CM | POA: Diagnosis not present

## 2017-09-27 DIAGNOSIS — N186 End stage renal disease: Secondary | ICD-10-CM | POA: Diagnosis not present

## 2017-09-27 DIAGNOSIS — D631 Anemia in chronic kidney disease: Secondary | ICD-10-CM | POA: Diagnosis not present

## 2017-09-27 DIAGNOSIS — D509 Iron deficiency anemia, unspecified: Secondary | ICD-10-CM | POA: Diagnosis not present

## 2017-09-27 DIAGNOSIS — N2581 Secondary hyperparathyroidism of renal origin: Secondary | ICD-10-CM | POA: Diagnosis not present

## 2017-09-27 DIAGNOSIS — K769 Liver disease, unspecified: Secondary | ICD-10-CM | POA: Diagnosis not present

## 2017-09-27 NOTE — Progress Notes (Signed)
09/29/2017 10:45 AM   Elicia Lamp [birth year] [id number]  Referring provider: Leone Haven, MD [address] [place], [place] [address]  Chief Complaint  Patient presents with  . Hypogonadism    54month    HPI: Patient is a 62 year old Caucasian male with a past medical history of renal transplant currently on dialysis with a history of testosterone deficiency and rising PSA who presents today for follow-up.    Rising PSA PSA Trend  2.18 in 07/2016  2.45 in 04/2017  3.12 in 04/2017  3.4 in 08/2017  Testosterone deficiency Not currently on therapy.    ESRD First transplant in 1999.  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  He underwent native bilateral nephrectomy and renal transplant in 2007.  His next transplant was in 2014.  Currently seen in 08/2017 for by transplant team.  He was told he was not a candidate for transplant.  On dialysis.      PMH: Past Medical History:  Diagnosis Date  . 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 Arthritis   . Chronic anticoagulation 06/2012  . Dialysis patient [facility]) 01 01 2008  . Dysrhythmia    A Fib  . Hx of colonoscopy    2009 by Dr. Laural Golden. Normal except for small submucosal lipoma. No evidence of polyps or colitis.  . Hyperlipidemia   . Hypertension   . Hypogonadism in male 07/25/2015  . 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  Marland Kitchen OSA (obstructive sleep apnea)    No CPAP  . Renal cell carcinoma ([place]) 08/04/2016    Surgical History: Past Surgical History:  Procedure Laterality Date  . AORTIC VALVE REPLACEMENT  3 /11 /2014   At [place]    . CAPD INSERTION N/A 02/19/2017   Procedure: LAPAROSCOPIC INSERTION CONTINUOUS AMBULATORY  PERITONEAL DIALYSIS  (CAPD) CATHETER;  Surgeon: Algernon Huxley, MD;  Location: [facility] ORS;  Service: General;  Laterality: N/A;  . CORONARY ANGIOPLASTY WITH STENT PLACEMENT    . DG AV DIALYSIS  SHUNT ACCESS EXIST*L* OR     left arm  . EPIGASTRIC HERNIA REPAIR N/A 02/19/2017   Procedure: HERNIA REPAIR EPIGASTRIC ADULT;  Surgeon: Robert Bellow, MD;  Location: [facility] ORS;  Service: General;  Laterality: N/A;  . EYE SURGERY     bilateral cataract  . HERNIA REPAIR     UMBILICAL HERNIA  . HERNIA REPAIR  02/19/2017  . INNER EAR SURGERY     20 years ago  . KIDNEY TRANSPLANT    . Peritoneal Dialysis access placement  02/19/2017  . TEE WITHOUT CARDIOVERSION N/A 07/21/2016   Procedure: TRANSESOPHAGEAL ECHOCARDIOGRAM (TEE);  Surgeon: Wellington Hampshire, MD;  Location: [facility] ORS;  Service: Cardiovascular;  Laterality: N/A;    Home Medications:  Allergies as of 09/29/2017      Reactions   Statins    Other reaction(s): Arthralgias (intolerance), Myalgias (intolerance)   Sertraline Rash      Medication List        Accurate as of 09/29/17 10:45 AM. Always use your most recent med list.          acetaminophen 500 MG tablet Commonly known as:  TYLENOL Take 1,000 mg 2 (two) times daily as  needed by mouth for moderate pain.   allopurinol 100 MG tablet Commonly known as:  ZYLOPRIM Take 2 tablets (200 mg total) by mouth daily.   calcitRIOL 0.5 MCG capsule Commonly known as:  ROCALTROL Take 0.5 mcg by mouth daily.   cinacalcet 60 MG tablet Commonly known as:  SENSIPAR Take 30 mg by mouth daily. At dialysis   dicyclomine 10 MG capsule Commonly known as:  BENTYL Take 2 capsules (20 mg total) by mouth 3 (three) times daily before meals.   diphenhydrAMINE HCl (Sleep) 50 MG Caps Take 50 mg at bedtime as needed by mouth (sleep).   erythromycin ophthalmic ointment Commonly known as:  ROMYCIN Place 1 application into the left eye 4 (four) times daily. Left eye x 1 week   Evolocumab 140 MG/ML  Soaj Commonly known as:  REPATHA SURECLICK Inject 1 application into the skin every 14 (fourteen) days.   Fish Oil 1200 MG Caps Take 1,200 mg 2 (two) times daily by mouth.   FLUoxetine 10 MG capsule Commonly known as:  PROZAC TAKE 1 CAPSULE BY MOUTH EVERY DAY   IRON PO Take by mouth.   lanthanum 1000 MG chewable tablet Commonly known as:  FOSRENOL Chew 2,000 mg 3 (three) times daily after meals by mouth.   lidocaine-prilocaine cream Commonly known as:  EMLA Apply 1 application as needed topically (port access).   metoprolol tartrate 100 MG tablet Commonly known as:  LOPRESSOR Take 1 tablet (100 mg total) by mouth 2 (two) times daily.   multivitamin with minerals Tabs tablet Take 1 tablet by mouth daily.   NAUZENE PO Take 1-2 tablets 2 (two) times daily as needed by mouth (nausea).   nitroGLYCERIN 0.4 MG SL tablet Commonly known as:  NITROSTAT Place 1 tablet (0.4 mg total) under the tongue every 5 (five) minutes as needed for chest pain.   omeprazole 20 MG capsule Commonly known as:  PRILOSEC Take 1 capsule (20 mg total) by mouth daily.   pravastatin 10 MG tablet Commonly known as:  PRAVACHOL TAKE ONE TABLET BY MOUTH ON MONDAY, WEDNESDAY, AND FRIDAY   rOPINIRole 0.25 MG tablet Commonly known as:  REQUIP Take 0.25 mg at bedtime as needed by mouth (restless legs).   Vitamin D3 1000 units Caps Take 2,000 Units every evening by mouth.   warfarin 1 MG tablet Commonly known as:  COUMADIN Take as directed by the anticoagulation clinic. If you are unsure how to take this medication, talk to your nurse or doctor. Original instructions:  Take 1 tablet (1 mg total) by mouth daily.       Allergies:  Allergies  Allergen Reactions  . Statins     Other reaction(s): Arthralgias (intolerance), Myalgias (intolerance)  . Sertraline Rash    Family History: Family History  Problem Relation Age of Onset  . Cancer Mother        pancreatic  . Heart disease Father   .  Diabetes Father     Social History:  reports that he has never smoked. He has never used smokeless tobacco. He reports that he does not drink alcohol or use drugs.  ROS: UROLOGY Frequent Urination?: No Hard to postpone urination?: No Burning/pain with urination?: No Get up at night to urinate?: No Leakage of urine?: No Urine stream starts and stops?: No Trouble starting stream?: No Do you have to strain to urinate?: No Blood in urine?: No Urinary tract infection?: No Sexually transmitted disease?: No Injury to kidneys or bladder?: No Painful intercourse?: No  Weak stream?: No Erection problems?: Yes Penile pain?: No  Gastrointestinal Nausea?: No Vomiting?: No Indigestion/heartburn?: No Diarrhea?: No Constipation?: No  Constitutional Fever: No Night sweats?: No Weight loss?: No Fatigue?: Yes  Skin Skin rash/lesions?: No Itching?: No  Eyes Blurred vision?: No Double vision?: No  Ears/Nose/Throat Sore throat?: No Sinus problems?: No  Hematologic/Lymphatic Swollen glands?: No Easy bruising?: No  Cardiovascular Leg swelling?: No Chest pain?: No  Respiratory Cough?: No Shortness of breath?: No  Endocrine Excessive thirst?: No  Musculoskeletal Back pain?: No Joint pain?: Yes  Neurological Headaches?: No Dizziness?: No  Psychologic Depression?: No Anxiety?: Yes  Physical Exam: BP 110/64   Pulse (!) 103   Ht 5\' 10"  (1.778 m)   Wt 180 lb (81.6 kg)   BMI 25.83 kg/m   Constitutional: Well nourished. Alert and oriented, No acute distress. HEENT:  AT, moist mucus membranes. Trachea midline, no masses. Cardiovascular: No clubbing, cyanosis, or edema. Respiratory: Normal respiratory effort, no increased work of breathing. GI: Abdomen is soft, non tender, non distended, no abdominal masses. Liver and spleen not palpable.  No hernias appreciated.  Stool sample for occult testing is not indicated.   GU: No CVA tenderness.  No bladder fullness or  masses.  Patient with circumcised phallus.  Urethral meatus is patent.  No penile discharge. No penile lesions or rashes. Scrotum without lesions, cysts, rashes and/or edema.  Testicles are located scrotally bilaterally. No masses are appreciated in the testicles. Left and right epididymis are normal. Rectal: Patient with  normal sphincter tone. Anus and perineum without scarring or rashes. No rectal masses are appreciated. Prostate is approximately 50 grams, no nodules are appreciated. Seminal vesicles are normal. Skin: No rashes, bruises or suspicious lesions. Lymph: No cervical or inguinal adenopathy. Neurologic: Grossly intact, no focal deficits, moving all 4 extremities. Psychiatric: Normal mood and affect.   Laboratory Data: Lab Results  Component Value Date   WBC 8.3 05/06/2017   HGB 10.6 (L) 05/06/2017   HCT 33.0 (L) 05/06/2017   MCV 101.5 (H) 05/06/2017   PLT 149 (L) 05/06/2017    Lab Results  Component Value Date   CREATININE 14.76 (H) 05/06/2017    Lab Results  Component Value Date   PSA 2.18 07/07/2016    Lab Results  Component Value Date   TESTOSTERONE 200 (L) 05/06/2017    Lab Results  Component Value Date   HGBA1C 5.2 08/08/2016    Urinalysis    Component Value Date/Time   COLORURINE AMBER (A) 07/17/2016 1531   APPEARANCEUR CLOUDY (A) 07/17/2016 1531   LABSPEC 1.030 07/17/2016 1531   PHURINE 8.0 07/17/2016 1531   GLUCOSEU 50 (A) 07/17/2016 1531   HGBUR NEGATIVE 07/17/2016 1531   BILIRUBINUR NEGATIVE 07/17/2016 1531   KETONESUR NEGATIVE 07/17/2016 1531   PROTEINUR 100 (A) 07/17/2016 1531   NITRITE NEGATIVE 07/17/2016 1531   LEUKOCYTESUR NEGATIVE 07/17/2016 1531   I have reviewed the labs.  Assessment & Plan:    1. Rising PSA I discussed with the patient that PSA is an acronym for  prostate specific antigen,  which is a protein made by the prostate gland and can be detected in the blood stream. I explained to the patient situations that would  increase the PSA, such as: a man's age,  BPH, infection, recent intercourse/ejaculation, prostate infarction, recent urethroscopic manipulation (Foley placement/cystoscopy) and prostate cancer.  We discussed that indications for prostate biopsy are defined by age and race specific PSA cutoffs as well as a PSA velocity of  0.75/year - his PSA has increased at a greater velocity of 0.75/year I explained that this increase in his PSA velocity is concerning for prostate cancer and a prostate biopsy would be recommended at this time.  The procedure is explained and the risks involved, such as blood in urine, blood in stool, blood in semen, infection, urinary retention, and on rare occasions sepsis and death.  Patient understands the risks as explained to him and he wishes to proceed.   He would like his PSA redrawn today and if it does not return to baseline he would like to pursue the prostate biopsy  2. Testosterone deficiency Patient is not on testosterone therapy at this time  3. ESRD Currently on hemodialysis Recently evaluated by transplant team and was told he was not a good candidate but was offered a hepatitis C positive kidney  Return for pending PSA results.  Zara Council, PA-C  [facility] [address] [place], [place] [address] [phone]

## 2017-09-28 ENCOUNTER — Ambulatory Visit (INDEPENDENT_AMBULATORY_CARE_PROVIDER_SITE_OTHER): Payer: Medicare Other | Admitting: Pharmacist

## 2017-09-28 DIAGNOSIS — Z952 Presence of prosthetic heart valve: Secondary | ICD-10-CM

## 2017-09-28 DIAGNOSIS — I481 Persistent atrial fibrillation: Secondary | ICD-10-CM

## 2017-09-28 DIAGNOSIS — D509 Iron deficiency anemia, unspecified: Secondary | ICD-10-CM | POA: Diagnosis not present

## 2017-09-28 DIAGNOSIS — D631 Anemia in chronic kidney disease: Secondary | ICD-10-CM | POA: Diagnosis not present

## 2017-09-28 DIAGNOSIS — N186 End stage renal disease: Secondary | ICD-10-CM | POA: Diagnosis not present

## 2017-09-28 DIAGNOSIS — Z7901 Long term (current) use of anticoagulants: Secondary | ICD-10-CM

## 2017-09-28 DIAGNOSIS — I4819 Other persistent atrial fibrillation: Secondary | ICD-10-CM

## 2017-09-28 DIAGNOSIS — N2589 Other disorders resulting from impaired renal tubular function: Secondary | ICD-10-CM | POA: Diagnosis not present

## 2017-09-28 DIAGNOSIS — N2581 Secondary hyperparathyroidism of renal origin: Secondary | ICD-10-CM | POA: Diagnosis not present

## 2017-09-28 DIAGNOSIS — K769 Liver disease, unspecified: Secondary | ICD-10-CM | POA: Diagnosis not present

## 2017-09-28 LAB — POCT INR: INR: 4.9 — AB (ref 2.0–3.0)

## 2017-09-29 ENCOUNTER — Telehealth: Payer: Self-pay | Admitting: Family Medicine

## 2017-09-29 ENCOUNTER — Ambulatory Visit (INDEPENDENT_AMBULATORY_CARE_PROVIDER_SITE_OTHER): Payer: Medicare Other | Admitting: Urology

## 2017-09-29 ENCOUNTER — Encounter: Payer: Self-pay | Admitting: Urology

## 2017-09-29 VITALS — BP 110/64 | HR 103 | Ht 70.0 in | Wt 180.0 lb

## 2017-09-29 DIAGNOSIS — K769 Liver disease, unspecified: Secondary | ICD-10-CM | POA: Diagnosis not present

## 2017-09-29 DIAGNOSIS — N186 End stage renal disease: Secondary | ICD-10-CM | POA: Diagnosis not present

## 2017-09-29 DIAGNOSIS — D631 Anemia in chronic kidney disease: Secondary | ICD-10-CM | POA: Diagnosis not present

## 2017-09-29 DIAGNOSIS — E349 Endocrine disorder, unspecified: Secondary | ICD-10-CM

## 2017-09-29 DIAGNOSIS — R972 Elevated prostate specific antigen [PSA]: Secondary | ICD-10-CM

## 2017-09-29 DIAGNOSIS — I2510 Atherosclerotic heart disease of native coronary artery without angina pectoris: Secondary | ICD-10-CM

## 2017-09-29 DIAGNOSIS — N2589 Other disorders resulting from impaired renal tubular function: Secondary | ICD-10-CM | POA: Diagnosis not present

## 2017-09-29 DIAGNOSIS — N2581 Secondary hyperparathyroidism of renal origin: Secondary | ICD-10-CM | POA: Diagnosis not present

## 2017-09-29 DIAGNOSIS — D509 Iron deficiency anemia, unspecified: Secondary | ICD-10-CM | POA: Diagnosis not present

## 2017-09-29 NOTE — Telephone Encounter (Signed)
Copied from [place] [id number]. Topic: General - Other >> Sep 29, 2017 12:02 PM Carolyn Stare wrote:  Angela Nevin with [facility] call to report   4.9 INR   [phone]

## 2017-09-29 NOTE — Progress Notes (Signed)
I have reviewed the above note and agree. I was available to the pharmacist for consultation.  Tommi Rumps, MD

## 2017-09-29 NOTE — Telephone Encounter (Signed)
Copied from [place] [id number]. Topic: General - Other >> Sep 29, 2017 12:02 PM Carolyn Stare wrote:  Angela Nevin with [facility] call to report   4.9 INR   [phone]    Patient coumadin dose 2 mg Mon . Wed , Fri, 1 mg all other days took his inr called Chrys Racer himself yesterday and she had him hold coumadin last night and to night.

## 2017-09-30 ENCOUNTER — Telehealth: Payer: Self-pay

## 2017-09-30 DIAGNOSIS — N186 End stage renal disease: Secondary | ICD-10-CM | POA: Diagnosis not present

## 2017-09-30 DIAGNOSIS — N2589 Other disorders resulting from impaired renal tubular function: Secondary | ICD-10-CM | POA: Diagnosis not present

## 2017-09-30 DIAGNOSIS — D631 Anemia in chronic kidney disease: Secondary | ICD-10-CM | POA: Diagnosis not present

## 2017-09-30 DIAGNOSIS — R972 Elevated prostate specific antigen [PSA]: Secondary | ICD-10-CM

## 2017-09-30 DIAGNOSIS — K769 Liver disease, unspecified: Secondary | ICD-10-CM | POA: Diagnosis not present

## 2017-09-30 DIAGNOSIS — N2581 Secondary hyperparathyroidism of renal origin: Secondary | ICD-10-CM | POA: Diagnosis not present

## 2017-09-30 DIAGNOSIS — D509 Iron deficiency anemia, unspecified: Secondary | ICD-10-CM | POA: Diagnosis not present

## 2017-09-30 LAB — PSA: Prostate Specific Ag, Serum: 3 ng/mL (ref 0.0–4.0)

## 2017-09-30 NOTE — Telephone Encounter (Signed)
-----   Message from Nori Riis, PA-C sent at 09/30/2017  7:24 AM EDT ----- Please let Jeremy [name] know that his PSA has come down to 3.0 from 3.4.  I would recommend repeating it again in three months and holding off on the biopsy at this time.

## 2017-09-30 NOTE — Telephone Encounter (Signed)
Jeremy [name] already spoke with the patient regarding this. There is a plan already in place.

## 2017-09-30 NOTE — Telephone Encounter (Signed)
Patient notified please schedule for 35mo with PSA prior thanks

## 2017-10-01 DIAGNOSIS — N2589 Other disorders resulting from impaired renal tubular function: Secondary | ICD-10-CM | POA: Diagnosis not present

## 2017-10-01 DIAGNOSIS — D631 Anemia in chronic kidney disease: Secondary | ICD-10-CM | POA: Diagnosis not present

## 2017-10-01 DIAGNOSIS — N2581 Secondary hyperparathyroidism of renal origin: Secondary | ICD-10-CM | POA: Diagnosis not present

## 2017-10-01 DIAGNOSIS — K769 Liver disease, unspecified: Secondary | ICD-10-CM | POA: Diagnosis not present

## 2017-10-01 DIAGNOSIS — D509 Iron deficiency anemia, unspecified: Secondary | ICD-10-CM | POA: Diagnosis not present

## 2017-10-01 DIAGNOSIS — N186 End stage renal disease: Secondary | ICD-10-CM | POA: Diagnosis not present

## 2017-10-01 NOTE — Telephone Encounter (Signed)
App made and mailed to patient  Jeremy [name]

## 2017-10-02 DIAGNOSIS — N2581 Secondary hyperparathyroidism of renal origin: Secondary | ICD-10-CM | POA: Diagnosis not present

## 2017-10-02 DIAGNOSIS — D509 Iron deficiency anemia, unspecified: Secondary | ICD-10-CM | POA: Diagnosis not present

## 2017-10-02 DIAGNOSIS — K769 Liver disease, unspecified: Secondary | ICD-10-CM | POA: Diagnosis not present

## 2017-10-02 DIAGNOSIS — D631 Anemia in chronic kidney disease: Secondary | ICD-10-CM | POA: Diagnosis not present

## 2017-10-02 DIAGNOSIS — N2589 Other disorders resulting from impaired renal tubular function: Secondary | ICD-10-CM | POA: Diagnosis not present

## 2017-10-02 DIAGNOSIS — N186 End stage renal disease: Secondary | ICD-10-CM | POA: Diagnosis not present

## 2017-10-03 DIAGNOSIS — N2581 Secondary hyperparathyroidism of renal origin: Secondary | ICD-10-CM | POA: Diagnosis not present

## 2017-10-03 DIAGNOSIS — N186 End stage renal disease: Secondary | ICD-10-CM | POA: Diagnosis not present

## 2017-10-03 DIAGNOSIS — D509 Iron deficiency anemia, unspecified: Secondary | ICD-10-CM | POA: Diagnosis not present

## 2017-10-03 DIAGNOSIS — D631 Anemia in chronic kidney disease: Secondary | ICD-10-CM | POA: Diagnosis not present

## 2017-10-03 DIAGNOSIS — K769 Liver disease, unspecified: Secondary | ICD-10-CM | POA: Diagnosis not present

## 2017-10-03 DIAGNOSIS — N2589 Other disorders resulting from impaired renal tubular function: Secondary | ICD-10-CM | POA: Diagnosis not present

## 2017-10-04 DIAGNOSIS — D509 Iron deficiency anemia, unspecified: Secondary | ICD-10-CM | POA: Diagnosis not present

## 2017-10-04 DIAGNOSIS — T8610 Unspecified complication of kidney transplant: Secondary | ICD-10-CM | POA: Diagnosis not present

## 2017-10-04 DIAGNOSIS — N2581 Secondary hyperparathyroidism of renal origin: Secondary | ICD-10-CM | POA: Diagnosis not present

## 2017-10-04 DIAGNOSIS — Z992 Dependence on renal dialysis: Secondary | ICD-10-CM | POA: Diagnosis not present

## 2017-10-04 DIAGNOSIS — D631 Anemia in chronic kidney disease: Secondary | ICD-10-CM | POA: Diagnosis not present

## 2017-10-04 DIAGNOSIS — K769 Liver disease, unspecified: Secondary | ICD-10-CM | POA: Diagnosis not present

## 2017-10-04 DIAGNOSIS — N186 End stage renal disease: Secondary | ICD-10-CM | POA: Diagnosis not present

## 2017-10-04 DIAGNOSIS — N2589 Other disorders resulting from impaired renal tubular function: Secondary | ICD-10-CM | POA: Diagnosis not present

## 2017-10-05 ENCOUNTER — Ambulatory Visit (INDEPENDENT_AMBULATORY_CARE_PROVIDER_SITE_OTHER): Payer: Medicare Other | Admitting: Pharmacist

## 2017-10-05 DIAGNOSIS — I482 Chronic atrial fibrillation: Secondary | ICD-10-CM | POA: Diagnosis not present

## 2017-10-05 DIAGNOSIS — I359 Nonrheumatic aortic valve disorder, unspecified: Secondary | ICD-10-CM | POA: Diagnosis not present

## 2017-10-05 DIAGNOSIS — Z7901 Long term (current) use of anticoagulants: Secondary | ICD-10-CM

## 2017-10-05 DIAGNOSIS — I481 Persistent atrial fibrillation: Secondary | ICD-10-CM | POA: Diagnosis not present

## 2017-10-05 DIAGNOSIS — E440 Moderate protein-calorie malnutrition: Secondary | ICD-10-CM | POA: Diagnosis not present

## 2017-10-05 DIAGNOSIS — Z79899 Other long term (current) drug therapy: Secondary | ICD-10-CM | POA: Diagnosis not present

## 2017-10-05 DIAGNOSIS — I4819 Other persistent atrial fibrillation: Secondary | ICD-10-CM

## 2017-10-05 DIAGNOSIS — Z952 Presence of prosthetic heart valve: Secondary | ICD-10-CM

## 2017-10-05 DIAGNOSIS — D631 Anemia in chronic kidney disease: Secondary | ICD-10-CM | POA: Diagnosis not present

## 2017-10-05 DIAGNOSIS — N2581 Secondary hyperparathyroidism of renal origin: Secondary | ICD-10-CM | POA: Diagnosis not present

## 2017-10-05 DIAGNOSIS — N186 End stage renal disease: Secondary | ICD-10-CM | POA: Diagnosis not present

## 2017-10-05 DIAGNOSIS — R17 Unspecified jaundice: Secondary | ICD-10-CM | POA: Diagnosis not present

## 2017-10-05 DIAGNOSIS — D509 Iron deficiency anemia, unspecified: Secondary | ICD-10-CM | POA: Diagnosis not present

## 2017-10-05 LAB — POCT INR: INR: 3.3 — AB (ref 2.0–3.0)

## 2017-10-05 NOTE — Progress Notes (Signed)
I have reviewed the above note and agree. I was available to the pharmacist for consultation.  Tommi Rumps, MD

## 2017-10-06 DIAGNOSIS — N2581 Secondary hyperparathyroidism of renal origin: Secondary | ICD-10-CM | POA: Diagnosis not present

## 2017-10-06 DIAGNOSIS — D631 Anemia in chronic kidney disease: Secondary | ICD-10-CM | POA: Diagnosis not present

## 2017-10-06 DIAGNOSIS — R17 Unspecified jaundice: Secondary | ICD-10-CM | POA: Diagnosis not present

## 2017-10-06 DIAGNOSIS — N186 End stage renal disease: Secondary | ICD-10-CM | POA: Diagnosis not present

## 2017-10-06 DIAGNOSIS — E440 Moderate protein-calorie malnutrition: Secondary | ICD-10-CM | POA: Diagnosis not present

## 2017-10-06 DIAGNOSIS — Z79899 Other long term (current) drug therapy: Secondary | ICD-10-CM | POA: Diagnosis not present

## 2017-10-07 DIAGNOSIS — Z79899 Other long term (current) drug therapy: Secondary | ICD-10-CM | POA: Diagnosis not present

## 2017-10-07 DIAGNOSIS — D631 Anemia in chronic kidney disease: Secondary | ICD-10-CM | POA: Diagnosis not present

## 2017-10-07 DIAGNOSIS — N2581 Secondary hyperparathyroidism of renal origin: Secondary | ICD-10-CM | POA: Diagnosis not present

## 2017-10-07 DIAGNOSIS — N186 End stage renal disease: Secondary | ICD-10-CM | POA: Diagnosis not present

## 2017-10-07 DIAGNOSIS — E440 Moderate protein-calorie malnutrition: Secondary | ICD-10-CM | POA: Diagnosis not present

## 2017-10-07 DIAGNOSIS — R17 Unspecified jaundice: Secondary | ICD-10-CM | POA: Diagnosis not present

## 2017-10-08 DIAGNOSIS — N186 End stage renal disease: Secondary | ICD-10-CM | POA: Diagnosis not present

## 2017-10-08 DIAGNOSIS — Z79899 Other long term (current) drug therapy: Secondary | ICD-10-CM | POA: Diagnosis not present

## 2017-10-08 DIAGNOSIS — N2581 Secondary hyperparathyroidism of renal origin: Secondary | ICD-10-CM | POA: Diagnosis not present

## 2017-10-08 DIAGNOSIS — D631 Anemia in chronic kidney disease: Secondary | ICD-10-CM | POA: Diagnosis not present

## 2017-10-08 DIAGNOSIS — E440 Moderate protein-calorie malnutrition: Secondary | ICD-10-CM | POA: Diagnosis not present

## 2017-10-08 DIAGNOSIS — R17 Unspecified jaundice: Secondary | ICD-10-CM | POA: Diagnosis not present

## 2017-10-09 DIAGNOSIS — N186 End stage renal disease: Secondary | ICD-10-CM | POA: Diagnosis not present

## 2017-10-09 DIAGNOSIS — L814 Other melanin hyperpigmentation: Secondary | ICD-10-CM | POA: Diagnosis not present

## 2017-10-09 DIAGNOSIS — L570 Actinic keratosis: Secondary | ICD-10-CM | POA: Diagnosis not present

## 2017-10-09 DIAGNOSIS — E440 Moderate protein-calorie malnutrition: Secondary | ICD-10-CM | POA: Diagnosis not present

## 2017-10-09 DIAGNOSIS — N2581 Secondary hyperparathyroidism of renal origin: Secondary | ICD-10-CM | POA: Diagnosis not present

## 2017-10-09 DIAGNOSIS — L821 Other seborrheic keratosis: Secondary | ICD-10-CM | POA: Diagnosis not present

## 2017-10-09 DIAGNOSIS — R17 Unspecified jaundice: Secondary | ICD-10-CM | POA: Diagnosis not present

## 2017-10-09 DIAGNOSIS — D631 Anemia in chronic kidney disease: Secondary | ICD-10-CM | POA: Diagnosis not present

## 2017-10-09 DIAGNOSIS — Z79899 Other long term (current) drug therapy: Secondary | ICD-10-CM | POA: Diagnosis not present

## 2017-10-10 DIAGNOSIS — N186 End stage renal disease: Secondary | ICD-10-CM | POA: Diagnosis not present

## 2017-10-10 DIAGNOSIS — E440 Moderate protein-calorie malnutrition: Secondary | ICD-10-CM | POA: Diagnosis not present

## 2017-10-10 DIAGNOSIS — N2581 Secondary hyperparathyroidism of renal origin: Secondary | ICD-10-CM | POA: Diagnosis not present

## 2017-10-10 DIAGNOSIS — Z79899 Other long term (current) drug therapy: Secondary | ICD-10-CM | POA: Diagnosis not present

## 2017-10-10 DIAGNOSIS — R17 Unspecified jaundice: Secondary | ICD-10-CM | POA: Diagnosis not present

## 2017-10-10 DIAGNOSIS — D631 Anemia in chronic kidney disease: Secondary | ICD-10-CM | POA: Diagnosis not present

## 2017-10-11 DIAGNOSIS — N2581 Secondary hyperparathyroidism of renal origin: Secondary | ICD-10-CM | POA: Diagnosis not present

## 2017-10-11 DIAGNOSIS — E440 Moderate protein-calorie malnutrition: Secondary | ICD-10-CM | POA: Diagnosis not present

## 2017-10-11 DIAGNOSIS — Z79899 Other long term (current) drug therapy: Secondary | ICD-10-CM | POA: Diagnosis not present

## 2017-10-11 DIAGNOSIS — D631 Anemia in chronic kidney disease: Secondary | ICD-10-CM | POA: Diagnosis not present

## 2017-10-11 DIAGNOSIS — R17 Unspecified jaundice: Secondary | ICD-10-CM | POA: Diagnosis not present

## 2017-10-11 DIAGNOSIS — N186 End stage renal disease: Secondary | ICD-10-CM | POA: Diagnosis not present

## 2017-10-12 ENCOUNTER — Encounter: Payer: Self-pay | Admitting: Family Medicine

## 2017-10-12 ENCOUNTER — Telehealth: Payer: Self-pay | Admitting: Family Medicine

## 2017-10-12 DIAGNOSIS — Z79899 Other long term (current) drug therapy: Secondary | ICD-10-CM | POA: Diagnosis not present

## 2017-10-12 DIAGNOSIS — N186 End stage renal disease: Secondary | ICD-10-CM | POA: Diagnosis not present

## 2017-10-12 DIAGNOSIS — N2581 Secondary hyperparathyroidism of renal origin: Secondary | ICD-10-CM | POA: Diagnosis not present

## 2017-10-12 DIAGNOSIS — D631 Anemia in chronic kidney disease: Secondary | ICD-10-CM | POA: Diagnosis not present

## 2017-10-12 DIAGNOSIS — R17 Unspecified jaundice: Secondary | ICD-10-CM | POA: Diagnosis not present

## 2017-10-12 DIAGNOSIS — E440 Moderate protein-calorie malnutrition: Secondary | ICD-10-CM | POA: Diagnosis not present

## 2017-10-12 LAB — POCT INR: INR: 3.6 — AB (ref 2.0–3.0)

## 2017-10-12 NOTE — Telephone Encounter (Signed)
-----   Message from Carlean Jews, [facility] sent at 10/05/2017  5:07 PM EDT ----- [place] and I'm not going to proactively call this patient today. He will send in his INR like usual but you might want to call him and check with him on Monday like he is used to.   Remember - his INR goal is 2.5-3.5. General rule of thumb - if you hold warfarin for a day, the INR will drop by ~1 point. So if his INR is 3.5, holding x1 day and then continuing current dose should be sufficient. He has been therapeutic for a few weeks on his current regimen.   ARRIVEDERCI! - Chrys Racer

## 2017-10-12 NOTE — Telephone Encounter (Signed)
Patient notified and states he is not having any bleeding issues and will hold his dose today. Patient will continue normal regimen tomorrow and check inr on monday

## 2017-10-12 NOTE — Telephone Encounter (Signed)
Please contact the patient and let him know his INR was 3.6.  This is above his goal.  Please ensure that he is not having any bleeding issues.  If he is he should be evaluated.  If he is not having any bleeding issues he should hold his next Coumadin dose and then resume his prior regimen and plan to recheck his INR next Monday.

## 2017-10-13 DIAGNOSIS — Z79899 Other long term (current) drug therapy: Secondary | ICD-10-CM | POA: Diagnosis not present

## 2017-10-13 DIAGNOSIS — E440 Moderate protein-calorie malnutrition: Secondary | ICD-10-CM | POA: Diagnosis not present

## 2017-10-13 DIAGNOSIS — R17 Unspecified jaundice: Secondary | ICD-10-CM | POA: Diagnosis not present

## 2017-10-13 DIAGNOSIS — N186 End stage renal disease: Secondary | ICD-10-CM | POA: Diagnosis not present

## 2017-10-13 DIAGNOSIS — D631 Anemia in chronic kidney disease: Secondary | ICD-10-CM | POA: Diagnosis not present

## 2017-10-13 DIAGNOSIS — N2581 Secondary hyperparathyroidism of renal origin: Secondary | ICD-10-CM | POA: Diagnosis not present

## 2017-10-14 DIAGNOSIS — R17 Unspecified jaundice: Secondary | ICD-10-CM | POA: Diagnosis not present

## 2017-10-14 DIAGNOSIS — Z79899 Other long term (current) drug therapy: Secondary | ICD-10-CM | POA: Diagnosis not present

## 2017-10-14 DIAGNOSIS — N2581 Secondary hyperparathyroidism of renal origin: Secondary | ICD-10-CM | POA: Diagnosis not present

## 2017-10-14 DIAGNOSIS — E440 Moderate protein-calorie malnutrition: Secondary | ICD-10-CM | POA: Diagnosis not present

## 2017-10-14 DIAGNOSIS — N186 End stage renal disease: Secondary | ICD-10-CM | POA: Diagnosis not present

## 2017-10-14 DIAGNOSIS — D631 Anemia in chronic kidney disease: Secondary | ICD-10-CM | POA: Diagnosis not present

## 2017-10-15 DIAGNOSIS — N186 End stage renal disease: Secondary | ICD-10-CM | POA: Diagnosis not present

## 2017-10-15 DIAGNOSIS — Z79899 Other long term (current) drug therapy: Secondary | ICD-10-CM | POA: Diagnosis not present

## 2017-10-15 DIAGNOSIS — N2581 Secondary hyperparathyroidism of renal origin: Secondary | ICD-10-CM | POA: Diagnosis not present

## 2017-10-15 DIAGNOSIS — D631 Anemia in chronic kidney disease: Secondary | ICD-10-CM | POA: Diagnosis not present

## 2017-10-15 DIAGNOSIS — R17 Unspecified jaundice: Secondary | ICD-10-CM | POA: Diagnosis not present

## 2017-10-15 DIAGNOSIS — E440 Moderate protein-calorie malnutrition: Secondary | ICD-10-CM | POA: Diagnosis not present

## 2017-10-16 DIAGNOSIS — Z79899 Other long term (current) drug therapy: Secondary | ICD-10-CM | POA: Diagnosis not present

## 2017-10-16 DIAGNOSIS — N186 End stage renal disease: Secondary | ICD-10-CM | POA: Diagnosis not present

## 2017-10-16 DIAGNOSIS — N2581 Secondary hyperparathyroidism of renal origin: Secondary | ICD-10-CM | POA: Diagnosis not present

## 2017-10-16 DIAGNOSIS — R17 Unspecified jaundice: Secondary | ICD-10-CM | POA: Diagnosis not present

## 2017-10-16 DIAGNOSIS — E440 Moderate protein-calorie malnutrition: Secondary | ICD-10-CM | POA: Diagnosis not present

## 2017-10-16 DIAGNOSIS — D631 Anemia in chronic kidney disease: Secondary | ICD-10-CM | POA: Diagnosis not present

## 2017-10-17 DIAGNOSIS — N186 End stage renal disease: Secondary | ICD-10-CM | POA: Diagnosis not present

## 2017-10-17 DIAGNOSIS — N2581 Secondary hyperparathyroidism of renal origin: Secondary | ICD-10-CM | POA: Diagnosis not present

## 2017-10-17 DIAGNOSIS — D631 Anemia in chronic kidney disease: Secondary | ICD-10-CM | POA: Diagnosis not present

## 2017-10-17 DIAGNOSIS — E440 Moderate protein-calorie malnutrition: Secondary | ICD-10-CM | POA: Diagnosis not present

## 2017-10-17 DIAGNOSIS — Z79899 Other long term (current) drug therapy: Secondary | ICD-10-CM | POA: Diagnosis not present

## 2017-10-17 DIAGNOSIS — R17 Unspecified jaundice: Secondary | ICD-10-CM | POA: Diagnosis not present

## 2017-10-18 DIAGNOSIS — D631 Anemia in chronic kidney disease: Secondary | ICD-10-CM | POA: Diagnosis not present

## 2017-10-18 DIAGNOSIS — E440 Moderate protein-calorie malnutrition: Secondary | ICD-10-CM | POA: Diagnosis not present

## 2017-10-18 DIAGNOSIS — N2581 Secondary hyperparathyroidism of renal origin: Secondary | ICD-10-CM | POA: Diagnosis not present

## 2017-10-18 DIAGNOSIS — Z79899 Other long term (current) drug therapy: Secondary | ICD-10-CM | POA: Diagnosis not present

## 2017-10-18 DIAGNOSIS — N186 End stage renal disease: Secondary | ICD-10-CM | POA: Diagnosis not present

## 2017-10-18 DIAGNOSIS — R17 Unspecified jaundice: Secondary | ICD-10-CM | POA: Diagnosis not present

## 2017-10-19 ENCOUNTER — Ambulatory Visit (INDEPENDENT_AMBULATORY_CARE_PROVIDER_SITE_OTHER): Payer: Medicare Other | Admitting: Pharmacist

## 2017-10-19 ENCOUNTER — Encounter: Payer: Self-pay | Admitting: Pharmacist

## 2017-10-19 DIAGNOSIS — Z7901 Long term (current) use of anticoagulants: Secondary | ICD-10-CM

## 2017-10-19 DIAGNOSIS — I4819 Other persistent atrial fibrillation: Secondary | ICD-10-CM

## 2017-10-19 DIAGNOSIS — E440 Moderate protein-calorie malnutrition: Secondary | ICD-10-CM | POA: Diagnosis not present

## 2017-10-19 DIAGNOSIS — Z952 Presence of prosthetic heart valve: Secondary | ICD-10-CM

## 2017-10-19 DIAGNOSIS — I481 Persistent atrial fibrillation: Secondary | ICD-10-CM | POA: Diagnosis not present

## 2017-10-19 DIAGNOSIS — I359 Nonrheumatic aortic valve disorder, unspecified: Secondary | ICD-10-CM

## 2017-10-19 DIAGNOSIS — N186 End stage renal disease: Secondary | ICD-10-CM | POA: Diagnosis not present

## 2017-10-19 DIAGNOSIS — N2581 Secondary hyperparathyroidism of renal origin: Secondary | ICD-10-CM | POA: Diagnosis not present

## 2017-10-19 DIAGNOSIS — D631 Anemia in chronic kidney disease: Secondary | ICD-10-CM | POA: Diagnosis not present

## 2017-10-19 DIAGNOSIS — Z79899 Other long term (current) drug therapy: Secondary | ICD-10-CM | POA: Diagnosis not present

## 2017-10-19 DIAGNOSIS — R17 Unspecified jaundice: Secondary | ICD-10-CM | POA: Diagnosis not present

## 2017-10-19 LAB — POCT INR: INR: 3.3 — AB (ref 2.0–3.0)

## 2017-10-19 NOTE — Progress Notes (Signed)
I have reviewed the above note and agree. I was available to the pharmacist for consultation.  Tommi Rumps, MD

## 2017-10-20 ENCOUNTER — Encounter: Payer: Self-pay | Admitting: Pharmacist

## 2017-10-20 DIAGNOSIS — Z79899 Other long term (current) drug therapy: Secondary | ICD-10-CM | POA: Diagnosis not present

## 2017-10-20 DIAGNOSIS — R17 Unspecified jaundice: Secondary | ICD-10-CM | POA: Diagnosis not present

## 2017-10-20 DIAGNOSIS — N186 End stage renal disease: Secondary | ICD-10-CM | POA: Diagnosis not present

## 2017-10-20 DIAGNOSIS — E440 Moderate protein-calorie malnutrition: Secondary | ICD-10-CM | POA: Diagnosis not present

## 2017-10-20 DIAGNOSIS — N2581 Secondary hyperparathyroidism of renal origin: Secondary | ICD-10-CM | POA: Diagnosis not present

## 2017-10-20 DIAGNOSIS — D631 Anemia in chronic kidney disease: Secondary | ICD-10-CM | POA: Diagnosis not present

## 2017-10-20 NOTE — Telephone Encounter (Signed)
This encounter was created in error - please disregard.

## 2017-10-21 DIAGNOSIS — Z79899 Other long term (current) drug therapy: Secondary | ICD-10-CM | POA: Diagnosis not present

## 2017-10-21 DIAGNOSIS — R17 Unspecified jaundice: Secondary | ICD-10-CM | POA: Diagnosis not present

## 2017-10-21 DIAGNOSIS — N2581 Secondary hyperparathyroidism of renal origin: Secondary | ICD-10-CM | POA: Diagnosis not present

## 2017-10-21 DIAGNOSIS — E440 Moderate protein-calorie malnutrition: Secondary | ICD-10-CM | POA: Diagnosis not present

## 2017-10-21 DIAGNOSIS — N186 End stage renal disease: Secondary | ICD-10-CM | POA: Diagnosis not present

## 2017-10-21 DIAGNOSIS — D631 Anemia in chronic kidney disease: Secondary | ICD-10-CM | POA: Diagnosis not present

## 2017-10-22 DIAGNOSIS — R17 Unspecified jaundice: Secondary | ICD-10-CM | POA: Diagnosis not present

## 2017-10-22 DIAGNOSIS — D631 Anemia in chronic kidney disease: Secondary | ICD-10-CM | POA: Diagnosis not present

## 2017-10-22 DIAGNOSIS — E440 Moderate protein-calorie malnutrition: Secondary | ICD-10-CM | POA: Diagnosis not present

## 2017-10-22 DIAGNOSIS — Z79899 Other long term (current) drug therapy: Secondary | ICD-10-CM | POA: Diagnosis not present

## 2017-10-22 DIAGNOSIS — N2581 Secondary hyperparathyroidism of renal origin: Secondary | ICD-10-CM | POA: Diagnosis not present

## 2017-10-22 DIAGNOSIS — N186 End stage renal disease: Secondary | ICD-10-CM | POA: Diagnosis not present

## 2017-10-23 DIAGNOSIS — Z79899 Other long term (current) drug therapy: Secondary | ICD-10-CM | POA: Diagnosis not present

## 2017-10-23 DIAGNOSIS — R17 Unspecified jaundice: Secondary | ICD-10-CM | POA: Diagnosis not present

## 2017-10-23 DIAGNOSIS — N186 End stage renal disease: Secondary | ICD-10-CM | POA: Diagnosis not present

## 2017-10-23 DIAGNOSIS — N2581 Secondary hyperparathyroidism of renal origin: Secondary | ICD-10-CM | POA: Diagnosis not present

## 2017-10-23 DIAGNOSIS — E440 Moderate protein-calorie malnutrition: Secondary | ICD-10-CM | POA: Diagnosis not present

## 2017-10-23 DIAGNOSIS — D631 Anemia in chronic kidney disease: Secondary | ICD-10-CM | POA: Diagnosis not present

## 2017-10-24 DIAGNOSIS — E440 Moderate protein-calorie malnutrition: Secondary | ICD-10-CM | POA: Diagnosis not present

## 2017-10-24 DIAGNOSIS — D631 Anemia in chronic kidney disease: Secondary | ICD-10-CM | POA: Diagnosis not present

## 2017-10-24 DIAGNOSIS — Z79899 Other long term (current) drug therapy: Secondary | ICD-10-CM | POA: Diagnosis not present

## 2017-10-24 DIAGNOSIS — R17 Unspecified jaundice: Secondary | ICD-10-CM | POA: Diagnosis not present

## 2017-10-24 DIAGNOSIS — N2581 Secondary hyperparathyroidism of renal origin: Secondary | ICD-10-CM | POA: Diagnosis not present

## 2017-10-24 DIAGNOSIS — N186 End stage renal disease: Secondary | ICD-10-CM | POA: Diagnosis not present

## 2017-10-25 DIAGNOSIS — Z79899 Other long term (current) drug therapy: Secondary | ICD-10-CM | POA: Diagnosis not present

## 2017-10-25 DIAGNOSIS — N186 End stage renal disease: Secondary | ICD-10-CM | POA: Diagnosis not present

## 2017-10-25 DIAGNOSIS — R17 Unspecified jaundice: Secondary | ICD-10-CM | POA: Diagnosis not present

## 2017-10-25 DIAGNOSIS — E440 Moderate protein-calorie malnutrition: Secondary | ICD-10-CM | POA: Diagnosis not present

## 2017-10-25 DIAGNOSIS — N2581 Secondary hyperparathyroidism of renal origin: Secondary | ICD-10-CM | POA: Diagnosis not present

## 2017-10-25 DIAGNOSIS — D631 Anemia in chronic kidney disease: Secondary | ICD-10-CM | POA: Diagnosis not present

## 2017-10-26 ENCOUNTER — Ambulatory Visit (INDEPENDENT_AMBULATORY_CARE_PROVIDER_SITE_OTHER): Payer: Medicare Other | Admitting: Pharmacist

## 2017-10-26 DIAGNOSIS — Z7901 Long term (current) use of anticoagulants: Secondary | ICD-10-CM

## 2017-10-26 DIAGNOSIS — E440 Moderate protein-calorie malnutrition: Secondary | ICD-10-CM | POA: Diagnosis not present

## 2017-10-26 DIAGNOSIS — I359 Nonrheumatic aortic valve disorder, unspecified: Secondary | ICD-10-CM | POA: Diagnosis not present

## 2017-10-26 DIAGNOSIS — N2581 Secondary hyperparathyroidism of renal origin: Secondary | ICD-10-CM | POA: Diagnosis not present

## 2017-10-26 DIAGNOSIS — Z79899 Other long term (current) drug therapy: Secondary | ICD-10-CM | POA: Diagnosis not present

## 2017-10-26 DIAGNOSIS — R17 Unspecified jaundice: Secondary | ICD-10-CM | POA: Diagnosis not present

## 2017-10-26 DIAGNOSIS — I4819 Other persistent atrial fibrillation: Secondary | ICD-10-CM

## 2017-10-26 DIAGNOSIS — D631 Anemia in chronic kidney disease: Secondary | ICD-10-CM | POA: Diagnosis not present

## 2017-10-26 DIAGNOSIS — I481 Persistent atrial fibrillation: Secondary | ICD-10-CM | POA: Diagnosis not present

## 2017-10-26 DIAGNOSIS — Z952 Presence of prosthetic heart valve: Secondary | ICD-10-CM

## 2017-10-26 DIAGNOSIS — N186 End stage renal disease: Secondary | ICD-10-CM | POA: Diagnosis not present

## 2017-10-26 LAB — POCT INR: INR: 3.3 — AB (ref 2.0–3.0)

## 2017-10-26 NOTE — Progress Notes (Signed)
I have reviewed the above note and agree. I was available to the pharmacist for consultation.  Tommi Rumps, MD

## 2017-10-27 DIAGNOSIS — Z79899 Other long term (current) drug therapy: Secondary | ICD-10-CM | POA: Diagnosis not present

## 2017-10-27 DIAGNOSIS — D631 Anemia in chronic kidney disease: Secondary | ICD-10-CM | POA: Diagnosis not present

## 2017-10-27 DIAGNOSIS — E440 Moderate protein-calorie malnutrition: Secondary | ICD-10-CM | POA: Diagnosis not present

## 2017-10-27 DIAGNOSIS — R17 Unspecified jaundice: Secondary | ICD-10-CM | POA: Diagnosis not present

## 2017-10-27 DIAGNOSIS — N2581 Secondary hyperparathyroidism of renal origin: Secondary | ICD-10-CM | POA: Diagnosis not present

## 2017-10-27 DIAGNOSIS — N186 End stage renal disease: Secondary | ICD-10-CM | POA: Diagnosis not present

## 2017-10-28 ENCOUNTER — Other Ambulatory Visit: Payer: Self-pay | Admitting: Family Medicine

## 2017-10-28 DIAGNOSIS — E440 Moderate protein-calorie malnutrition: Secondary | ICD-10-CM | POA: Diagnosis not present

## 2017-10-28 DIAGNOSIS — R17 Unspecified jaundice: Secondary | ICD-10-CM | POA: Diagnosis not present

## 2017-10-28 DIAGNOSIS — N186 End stage renal disease: Secondary | ICD-10-CM | POA: Diagnosis not present

## 2017-10-28 DIAGNOSIS — Z79899 Other long term (current) drug therapy: Secondary | ICD-10-CM | POA: Diagnosis not present

## 2017-10-28 DIAGNOSIS — N2581 Secondary hyperparathyroidism of renal origin: Secondary | ICD-10-CM | POA: Diagnosis not present

## 2017-10-28 DIAGNOSIS — D631 Anemia in chronic kidney disease: Secondary | ICD-10-CM | POA: Diagnosis not present

## 2017-10-29 DIAGNOSIS — D631 Anemia in chronic kidney disease: Secondary | ICD-10-CM | POA: Diagnosis not present

## 2017-10-29 DIAGNOSIS — N186 End stage renal disease: Secondary | ICD-10-CM | POA: Diagnosis not present

## 2017-10-29 DIAGNOSIS — N2581 Secondary hyperparathyroidism of renal origin: Secondary | ICD-10-CM | POA: Diagnosis not present

## 2017-10-29 DIAGNOSIS — E440 Moderate protein-calorie malnutrition: Secondary | ICD-10-CM | POA: Diagnosis not present

## 2017-10-29 DIAGNOSIS — Z79899 Other long term (current) drug therapy: Secondary | ICD-10-CM | POA: Diagnosis not present

## 2017-10-29 DIAGNOSIS — R17 Unspecified jaundice: Secondary | ICD-10-CM | POA: Diagnosis not present

## 2017-10-30 DIAGNOSIS — E440 Moderate protein-calorie malnutrition: Secondary | ICD-10-CM | POA: Diagnosis not present

## 2017-10-30 DIAGNOSIS — D631 Anemia in chronic kidney disease: Secondary | ICD-10-CM | POA: Diagnosis not present

## 2017-10-30 DIAGNOSIS — Z79899 Other long term (current) drug therapy: Secondary | ICD-10-CM | POA: Diagnosis not present

## 2017-10-30 DIAGNOSIS — R17 Unspecified jaundice: Secondary | ICD-10-CM | POA: Diagnosis not present

## 2017-10-30 DIAGNOSIS — N2581 Secondary hyperparathyroidism of renal origin: Secondary | ICD-10-CM | POA: Diagnosis not present

## 2017-10-30 DIAGNOSIS — N186 End stage renal disease: Secondary | ICD-10-CM | POA: Diagnosis not present

## 2017-10-31 DIAGNOSIS — R17 Unspecified jaundice: Secondary | ICD-10-CM | POA: Diagnosis not present

## 2017-10-31 DIAGNOSIS — N2581 Secondary hyperparathyroidism of renal origin: Secondary | ICD-10-CM | POA: Diagnosis not present

## 2017-10-31 DIAGNOSIS — Z79899 Other long term (current) drug therapy: Secondary | ICD-10-CM | POA: Diagnosis not present

## 2017-10-31 DIAGNOSIS — N186 End stage renal disease: Secondary | ICD-10-CM | POA: Diagnosis not present

## 2017-10-31 DIAGNOSIS — D631 Anemia in chronic kidney disease: Secondary | ICD-10-CM | POA: Diagnosis not present

## 2017-10-31 DIAGNOSIS — E440 Moderate protein-calorie malnutrition: Secondary | ICD-10-CM | POA: Diagnosis not present

## 2017-11-01 DIAGNOSIS — N186 End stage renal disease: Secondary | ICD-10-CM | POA: Diagnosis not present

## 2017-11-01 DIAGNOSIS — D631 Anemia in chronic kidney disease: Secondary | ICD-10-CM | POA: Diagnosis not present

## 2017-11-01 DIAGNOSIS — Z79899 Other long term (current) drug therapy: Secondary | ICD-10-CM | POA: Diagnosis not present

## 2017-11-01 DIAGNOSIS — R17 Unspecified jaundice: Secondary | ICD-10-CM | POA: Diagnosis not present

## 2017-11-01 DIAGNOSIS — E440 Moderate protein-calorie malnutrition: Secondary | ICD-10-CM | POA: Diagnosis not present

## 2017-11-01 DIAGNOSIS — N2581 Secondary hyperparathyroidism of renal origin: Secondary | ICD-10-CM | POA: Diagnosis not present

## 2017-11-02 ENCOUNTER — Ambulatory Visit (INDEPENDENT_AMBULATORY_CARE_PROVIDER_SITE_OTHER): Payer: Medicare Other | Admitting: Pharmacist

## 2017-11-02 DIAGNOSIS — I482 Chronic atrial fibrillation: Secondary | ICD-10-CM | POA: Diagnosis not present

## 2017-11-02 DIAGNOSIS — D631 Anemia in chronic kidney disease: Secondary | ICD-10-CM | POA: Diagnosis not present

## 2017-11-02 DIAGNOSIS — Z7901 Long term (current) use of anticoagulants: Secondary | ICD-10-CM | POA: Diagnosis not present

## 2017-11-02 DIAGNOSIS — N2581 Secondary hyperparathyroidism of renal origin: Secondary | ICD-10-CM | POA: Diagnosis not present

## 2017-11-02 DIAGNOSIS — Z79899 Other long term (current) drug therapy: Secondary | ICD-10-CM | POA: Diagnosis not present

## 2017-11-02 DIAGNOSIS — I359 Nonrheumatic aortic valve disorder, unspecified: Secondary | ICD-10-CM

## 2017-11-02 DIAGNOSIS — N186 End stage renal disease: Secondary | ICD-10-CM | POA: Diagnosis not present

## 2017-11-02 DIAGNOSIS — I481 Persistent atrial fibrillation: Secondary | ICD-10-CM

## 2017-11-02 DIAGNOSIS — Z952 Presence of prosthetic heart valve: Secondary | ICD-10-CM

## 2017-11-02 DIAGNOSIS — R17 Unspecified jaundice: Secondary | ICD-10-CM | POA: Diagnosis not present

## 2017-11-02 DIAGNOSIS — I4819 Other persistent atrial fibrillation: Secondary | ICD-10-CM

## 2017-11-02 DIAGNOSIS — E440 Moderate protein-calorie malnutrition: Secondary | ICD-10-CM | POA: Diagnosis not present

## 2017-11-02 LAB — POCT INR: INR: 3.1 — AB (ref 2.0–3.0)

## 2017-11-02 NOTE — Progress Notes (Signed)
I have reviewed the above note and agree. I was available to the pharmacist for consultation.  Tommi Rumps, MD

## 2017-11-03 DIAGNOSIS — Z8679 Personal history of other diseases of the circulatory system: Secondary | ICD-10-CM | POA: Diagnosis not present

## 2017-11-03 DIAGNOSIS — N2581 Secondary hyperparathyroidism of renal origin: Secondary | ICD-10-CM | POA: Diagnosis not present

## 2017-11-03 DIAGNOSIS — I4510 Unspecified right bundle-branch block: Secondary | ICD-10-CM | POA: Diagnosis not present

## 2017-11-03 DIAGNOSIS — I4891 Unspecified atrial fibrillation: Secondary | ICD-10-CM | POA: Diagnosis not present

## 2017-11-03 DIAGNOSIS — D649 Anemia, unspecified: Secondary | ICD-10-CM | POA: Diagnosis not present

## 2017-11-03 DIAGNOSIS — Z955 Presence of coronary angioplasty implant and graft: Secondary | ICD-10-CM | POA: Diagnosis not present

## 2017-11-03 DIAGNOSIS — Z79899 Other long term (current) drug therapy: Secondary | ICD-10-CM | POA: Diagnosis not present

## 2017-11-03 DIAGNOSIS — I509 Heart failure, unspecified: Secondary | ICD-10-CM | POA: Diagnosis not present

## 2017-11-03 DIAGNOSIS — I132 Hypertensive heart and chronic kidney disease with heart failure and with stage 5 chronic kidney disease, or end stage renal disease: Secondary | ICD-10-CM | POA: Diagnosis not present

## 2017-11-03 DIAGNOSIS — E440 Moderate protein-calorie malnutrition: Secondary | ICD-10-CM | POA: Diagnosis not present

## 2017-11-03 DIAGNOSIS — R17 Unspecified jaundice: Secondary | ICD-10-CM | POA: Diagnosis not present

## 2017-11-03 DIAGNOSIS — Z992 Dependence on renal dialysis: Secondary | ICD-10-CM | POA: Diagnosis not present

## 2017-11-03 DIAGNOSIS — Z888 Allergy status to other drugs, medicaments and biological substances status: Secondary | ICD-10-CM | POA: Diagnosis not present

## 2017-11-03 DIAGNOSIS — Z09 Encounter for follow-up examination after completed treatment for conditions other than malignant neoplasm: Secondary | ICD-10-CM | POA: Diagnosis not present

## 2017-11-03 DIAGNOSIS — Z952 Presence of prosthetic heart valve: Secondary | ICD-10-CM | POA: Diagnosis not present

## 2017-11-03 DIAGNOSIS — D631 Anemia in chronic kidney disease: Secondary | ICD-10-CM | POA: Diagnosis not present

## 2017-11-03 DIAGNOSIS — N186 End stage renal disease: Secondary | ICD-10-CM | POA: Diagnosis not present

## 2017-11-03 DIAGNOSIS — Z7901 Long term (current) use of anticoagulants: Secondary | ICD-10-CM | POA: Diagnosis not present

## 2017-11-04 DIAGNOSIS — R17 Unspecified jaundice: Secondary | ICD-10-CM | POA: Diagnosis not present

## 2017-11-04 DIAGNOSIS — T8610 Unspecified complication of kidney transplant: Secondary | ICD-10-CM | POA: Diagnosis not present

## 2017-11-04 DIAGNOSIS — Z992 Dependence on renal dialysis: Secondary | ICD-10-CM | POA: Diagnosis not present

## 2017-11-04 DIAGNOSIS — N2581 Secondary hyperparathyroidism of renal origin: Secondary | ICD-10-CM | POA: Diagnosis not present

## 2017-11-04 DIAGNOSIS — D631 Anemia in chronic kidney disease: Secondary | ICD-10-CM | POA: Diagnosis not present

## 2017-11-04 DIAGNOSIS — E440 Moderate protein-calorie malnutrition: Secondary | ICD-10-CM | POA: Diagnosis not present

## 2017-11-04 DIAGNOSIS — N186 End stage renal disease: Secondary | ICD-10-CM | POA: Diagnosis not present

## 2017-11-04 DIAGNOSIS — Z79899 Other long term (current) drug therapy: Secondary | ICD-10-CM | POA: Diagnosis not present

## 2017-11-05 DIAGNOSIS — N2581 Secondary hyperparathyroidism of renal origin: Secondary | ICD-10-CM | POA: Diagnosis not present

## 2017-11-05 DIAGNOSIS — N186 End stage renal disease: Secondary | ICD-10-CM | POA: Diagnosis not present

## 2017-11-06 DIAGNOSIS — N2581 Secondary hyperparathyroidism of renal origin: Secondary | ICD-10-CM | POA: Diagnosis not present

## 2017-11-06 DIAGNOSIS — N186 End stage renal disease: Secondary | ICD-10-CM | POA: Diagnosis not present

## 2017-11-06 DIAGNOSIS — D631 Anemia in chronic kidney disease: Secondary | ICD-10-CM | POA: Diagnosis not present

## 2017-11-09 ENCOUNTER — Ambulatory Visit (INDEPENDENT_AMBULATORY_CARE_PROVIDER_SITE_OTHER): Payer: Medicare Other | Admitting: Pharmacist

## 2017-11-09 DIAGNOSIS — I481 Persistent atrial fibrillation: Secondary | ICD-10-CM | POA: Diagnosis not present

## 2017-11-09 DIAGNOSIS — N186 End stage renal disease: Secondary | ICD-10-CM | POA: Diagnosis not present

## 2017-11-09 DIAGNOSIS — Z952 Presence of prosthetic heart valve: Secondary | ICD-10-CM

## 2017-11-09 DIAGNOSIS — D631 Anemia in chronic kidney disease: Secondary | ICD-10-CM | POA: Diagnosis not present

## 2017-11-09 DIAGNOSIS — I4819 Other persistent atrial fibrillation: Secondary | ICD-10-CM

## 2017-11-09 DIAGNOSIS — N2581 Secondary hyperparathyroidism of renal origin: Secondary | ICD-10-CM | POA: Diagnosis not present

## 2017-11-09 DIAGNOSIS — Z7901 Long term (current) use of anticoagulants: Secondary | ICD-10-CM

## 2017-11-09 LAB — POCT INR: INR: 3.2 — AB (ref 2.0–3.0)

## 2017-11-09 NOTE — Progress Notes (Signed)
I have reviewed the above note and agree. I was available to the pharmacist for consultation.  Tommi Rumps, MD

## 2017-11-11 ENCOUNTER — Ambulatory Visit: Payer: Medicare Other | Admitting: Family Medicine

## 2017-11-11 DIAGNOSIS — D631 Anemia in chronic kidney disease: Secondary | ICD-10-CM | POA: Diagnosis not present

## 2017-11-11 DIAGNOSIS — Z0289 Encounter for other administrative examinations: Secondary | ICD-10-CM

## 2017-11-11 DIAGNOSIS — N2581 Secondary hyperparathyroidism of renal origin: Secondary | ICD-10-CM | POA: Diagnosis not present

## 2017-11-11 DIAGNOSIS — N186 End stage renal disease: Secondary | ICD-10-CM | POA: Diagnosis not present

## 2017-11-12 ENCOUNTER — Telehealth: Payer: Self-pay | Admitting: Family Medicine

## 2017-11-12 NOTE — Telephone Encounter (Signed)
Please advise on where he can be scheduled on Dr. Ellen Henri schedule

## 2017-11-12 NOTE — Telephone Encounter (Signed)
Please advise where to schedule.

## 2017-11-12 NOTE — Telephone Encounter (Unsigned)
Copied from [place] [id number]. Topic: Quick Communication - See Telephone Encounter >> Nov 12, 2017  2:03 PM Neva Seat wrote: Pt missed Wed's appt due to pt is back on chemo dialysis treatments and pt fell asleep. Pt needing to make anohter appt w/ Caryl Bis for his 3 month f/u on a Tues or Thurs if possible.

## 2017-11-13 DIAGNOSIS — D631 Anemia in chronic kidney disease: Secondary | ICD-10-CM | POA: Diagnosis not present

## 2017-11-13 DIAGNOSIS — N2581 Secondary hyperparathyroidism of renal origin: Secondary | ICD-10-CM | POA: Diagnosis not present

## 2017-11-13 DIAGNOSIS — N186 End stage renal disease: Secondary | ICD-10-CM | POA: Diagnosis not present

## 2017-11-14 NOTE — Telephone Encounter (Signed)
Please see me on Monday to discuss a place to schedule him. Thanks.

## 2017-11-16 ENCOUNTER — Ambulatory Visit (INDEPENDENT_AMBULATORY_CARE_PROVIDER_SITE_OTHER): Payer: Medicare Other | Admitting: Pharmacist

## 2017-11-16 DIAGNOSIS — Z952 Presence of prosthetic heart valve: Secondary | ICD-10-CM

## 2017-11-16 DIAGNOSIS — Z7901 Long term (current) use of anticoagulants: Secondary | ICD-10-CM | POA: Diagnosis not present

## 2017-11-16 DIAGNOSIS — I481 Persistent atrial fibrillation: Secondary | ICD-10-CM

## 2017-11-16 DIAGNOSIS — N186 End stage renal disease: Secondary | ICD-10-CM | POA: Diagnosis not present

## 2017-11-16 DIAGNOSIS — N2581 Secondary hyperparathyroidism of renal origin: Secondary | ICD-10-CM | POA: Diagnosis not present

## 2017-11-16 DIAGNOSIS — D631 Anemia in chronic kidney disease: Secondary | ICD-10-CM | POA: Diagnosis not present

## 2017-11-16 DIAGNOSIS — I4819 Other persistent atrial fibrillation: Secondary | ICD-10-CM

## 2017-11-16 LAB — POCT INR: INR: 1.8 — AB (ref 2.0–3.0)

## 2017-11-16 NOTE — Telephone Encounter (Signed)
Please advise 11/17/17 11:30 is not available

## 2017-11-16 NOTE — Telephone Encounter (Signed)
Left message to return call, ok for pec to speak to patient and schedule follow up on 11/17/17 please change time to 40 min appmt

## 2017-11-16 NOTE — Progress Notes (Signed)
I have reviewed the above note and agree. I was available to the pharmacist for consultation.  Tommi Rumps, MD

## 2017-11-16 NOTE — Telephone Encounter (Signed)
He can be placed on a Tuesday afternoon for follow-up though he needs more than 20 minutes for follow-up. Please place in a 40 minute slot.

## 2017-11-17 ENCOUNTER — Ambulatory Visit (INDEPENDENT_AMBULATORY_CARE_PROVIDER_SITE_OTHER): Payer: Medicare Other | Admitting: Family Medicine

## 2017-11-17 ENCOUNTER — Ambulatory Visit: Payer: Self-pay | Admitting: *Deleted

## 2017-11-17 ENCOUNTER — Encounter: Payer: Self-pay | Admitting: Family Medicine

## 2017-11-17 DIAGNOSIS — I2510 Atherosclerotic heart disease of native coronary artery without angina pectoris: Secondary | ICD-10-CM | POA: Diagnosis not present

## 2017-11-17 DIAGNOSIS — S81819A Laceration without foreign body, unspecified lower leg, initial encounter: Secondary | ICD-10-CM | POA: Diagnosis not present

## 2017-11-17 DIAGNOSIS — Z992 Dependence on renal dialysis: Secondary | ICD-10-CM | POA: Diagnosis not present

## 2017-11-17 DIAGNOSIS — M2550 Pain in unspecified joint: Secondary | ICD-10-CM | POA: Diagnosis not present

## 2017-11-17 DIAGNOSIS — I481 Persistent atrial fibrillation: Secondary | ICD-10-CM

## 2017-11-17 DIAGNOSIS — N186 End stage renal disease: Secondary | ICD-10-CM

## 2017-11-17 DIAGNOSIS — I4819 Other persistent atrial fibrillation: Secondary | ICD-10-CM

## 2017-11-17 MED ORDER — METOPROLOL TARTRATE 100 MG PO TABS: 50.0000 mg | ORAL_TABLET | Freq: Two times a day (BID) | ORAL | 3 refills | Status: DC

## 2017-11-17 NOTE — Telephone Encounter (Signed)
Renee Harr, CSR called to report that the "pt's INR is 1.8 and it was called in yesterday"; will route to office for notification.

## 2017-11-17 NOTE — Assessment & Plan Note (Addendum)
Rate controlled today though he does report heart rates up to 111 at home.  Discussed restarting on metoprolol though at a decreased dose of 50 mg.  He reported after being given the AVS that he was only taking his metoprolol once daily previously.  He will resume 50 mg once daily.  Plan for recheck of INR next week through his home machine.  He will monitor for excessive bleeding.

## 2017-11-17 NOTE — Progress Notes (Signed)
Tommi Rumps, MD Phone: [phone]  Jeremy [name] is a 62 y.o. male who presents today for f/u.  CC: ESRD, afib, skin cut  ESRD: Patient is now back on hemodialysis Monday Wednesday and Friday.  He sees the nephrologist typically on Fridays.  He has seen the transplant clinic at [facility] and they are setting up an evaluation for this.  They may use a hepatitis C renal transplant.  A. fib: He saw cardiology last week.  He had noticed some slight increased palpitations and reported that to them.  He notes no chest pain or shortness of breath.  No edema.  He has been taking his Coumadin.  His INR was 1.8 this week and he did take an extra dose after talking to our clinical pharmacist.  He has taken some prednisone in the last week and thinks that may have thrown things off.  He does note he stopped his metoprolol given that his blood pressures dropping to the 80s over 60s at dialysis.  He reports nobody advised him to do this and he did it on his own.  He was only taking the metoprolol once daily.  He thinks the increased palpitations have occurred since then.  He follows with rheumatology for osteoarthritis and gout.  He reports they advised him he could take the prednisone if needed.  He wonders if he could take 10 mg daily.  He does note issues with his bilateral index fingers and being able to flex and.  This is been a chronic issue though is slightly worse.  He wonders about being able to get injections in his fingers.  Patient reports he cut his leg last week on the medial aspect of his right calf.  Notes it bled for about 20 minutes and he used a wound compound to get it to stop.  It has been healing well with no signs of infection.  Social History   Tobacco Use  Smoking Status Never Smoker  Smokeless Tobacco Never Used     ROS see history of present illness  Objective  Physical Exam Vitals:   11/17/17 1454  BP: 116/60  Pulse: 73  Temp: 98.6 F (37 C)  SpO2: 98%    BP  Readings from Last 3 Encounters:  11/17/17 116/60  09/29/17 110/64  08/21/17 124/70   Wt Readings from Last 3 Encounters:  11/17/17 188 lb 9.6 oz (85.5 kg)  09/29/17 180 lb (81.6 kg)  08/21/17 184 lb (83.5 kg)    Physical Exam  Constitutional: No distress.  Cardiovascular: Normal rate and normal heart sounds. An irregularly irregular rhythm present.  Pulmonary/Chest: Effort normal and breath sounds normal.  Musculoskeletal: He exhibits no edema.  Evidence of arthritis in bilateral hand, bilateral index fingers with chronic joint swelling, difficult to the with active and passive flexion of the index fingers  Neurological: He is alert.  Skin: Skin is warm and dry. He is not diaphoretic.        Assessment/Plan: Please see individual problem list.  Atrial fibrillation ([place]) [I48.91] Rate controlled today though he does report heart rates up to 111 at home.  Discussed restarting on metoprolol though at a decreased dose of 50 mg.  He reported after being given the AVS that he was only taking his metoprolol once daily previously.  He will resume 50 mg once daily.  Plan for recheck of INR next week through his home machine.  He will monitor for excessive bleeding.  ESRD on dialysis [facility]) He will continue  HD per nephrology.  He will continue to follow with the transplant physicians as well.  He will monitor his blood pressure at HD given that he will be back on metoprolol 50 mg.  Arthralgia Likely osteoarthritis and gout contributing.  I advised him to discuss with his rheumatologist whether or not he could be on prednisone chronically.  I also advised him to contact them regarding his fingers and his difficulty flexing his fingers.  Cut of lower leg Appears to be healing well.  I suspect the minimal surrounding erythema is a healing reaction.  He has no warmth, induration, or tenderness to indicate infection.  If he develops spreading redness he will in the very least contact us or get  evaluated again.  He will monitor this.  Cleanse with soap and water.   No orders of the defined types were placed in this encounter.   Meds ordered this encounter  Medications  . metoprolol tartrate (LOPRESSOR) 100 MG tablet    Sig: Take 0.5 tablets (50 mg total) by mouth 2 (two) times daily.    Dispense:  90 tablet    Refill:  [place], MD [place]

## 2017-11-17 NOTE — Telephone Encounter (Signed)
Jeremy [name] spoke with patient regarding this yesterday. Please see her anticoagulation visit note.

## 2017-11-17 NOTE — Assessment & Plan Note (Signed)
Likely osteoarthritis and gout contributing.  I advised him to discuss with his rheumatologist whether or not he could be on prednisone chronically.  I also advised him to contact them regarding his fingers and his difficulty flexing his fingers.

## 2017-11-17 NOTE — Telephone Encounter (Signed)
Please advise 

## 2017-11-17 NOTE — Telephone Encounter (Signed)
  Reason for Disposition . General information question, no triage required and triager able to answer question  Answer Assessment - Initial Assessment Questions 1. REASON FOR CALL or QUESTION: "What is your reason for calling today?" or "How can I best help you?" or "What question do you have that I can help answer?"     Called to report INR 1.8  Protocols used: INFORMATION ONLY CALL-A-AH

## 2017-11-17 NOTE — Assessment & Plan Note (Signed)
He will continue HD per nephrology.  He will continue to follow with the transplant physicians as well.  He will monitor his blood pressure at HD given that he will be back on metoprolol 50 mg.

## 2017-11-17 NOTE — Patient Instructions (Addendum)
Nice to see you. We will have you decrease your metoprolol to 50 mg twice daily.  You need to monitor your blood pressure with this change particularly at dialysis.  If needed we could decrease the dose further.  If you develop any palpitations that are persistent please seek medical attention immediately. Please monitor the cut on your right calf.  If you develop spreading redness, pain, drainage, swelling, or any new or changing symptoms please seek medical attention immediately.  Patient noted after he was given the AVS that his metoprolol was only being taken once daily.  I advised him to resume it is once daily dosing.

## 2017-11-17 NOTE — Telephone Encounter (Signed)
scheduled by pec

## 2017-11-17 NOTE — Assessment & Plan Note (Signed)
Appears to be healing well.  I suspect the minimal surrounding erythema is a healing reaction.  He has no warmth, induration, or tenderness to indicate infection.  If he develops spreading redness he will in the very least contact us or get evaluated again.  He will monitor this.  Cleanse with soap and water.

## 2017-11-17 NOTE — Telephone Encounter (Signed)
FYI

## 2017-11-18 DIAGNOSIS — N2581 Secondary hyperparathyroidism of renal origin: Secondary | ICD-10-CM | POA: Diagnosis not present

## 2017-11-18 DIAGNOSIS — D631 Anemia in chronic kidney disease: Secondary | ICD-10-CM | POA: Diagnosis not present

## 2017-11-18 DIAGNOSIS — N186 End stage renal disease: Secondary | ICD-10-CM | POA: Diagnosis not present

## 2017-11-20 ENCOUNTER — Encounter (INDEPENDENT_AMBULATORY_CARE_PROVIDER_SITE_OTHER): Payer: Self-pay

## 2017-11-20 ENCOUNTER — Ambulatory Visit (INDEPENDENT_AMBULATORY_CARE_PROVIDER_SITE_OTHER): Payer: Medicare Other | Admitting: Nurse Practitioner

## 2017-11-20 ENCOUNTER — Encounter (INDEPENDENT_AMBULATORY_CARE_PROVIDER_SITE_OTHER): Payer: Self-pay | Admitting: Nurse Practitioner

## 2017-11-20 VITALS — BP 109/62 | HR 68 | Resp 13 | Ht 70.0 in | Wt 185.0 lb

## 2017-11-20 DIAGNOSIS — I2510 Atherosclerotic heart disease of native coronary artery without angina pectoris: Secondary | ICD-10-CM | POA: Diagnosis not present

## 2017-11-20 DIAGNOSIS — E785 Hyperlipidemia, unspecified: Secondary | ICD-10-CM | POA: Diagnosis not present

## 2017-11-20 DIAGNOSIS — N2581 Secondary hyperparathyroidism of renal origin: Secondary | ICD-10-CM | POA: Diagnosis not present

## 2017-11-20 DIAGNOSIS — Z992 Dependence on renal dialysis: Secondary | ICD-10-CM | POA: Diagnosis not present

## 2017-11-20 DIAGNOSIS — I10 Essential (primary) hypertension: Secondary | ICD-10-CM

## 2017-11-20 DIAGNOSIS — N186 End stage renal disease: Secondary | ICD-10-CM | POA: Diagnosis not present

## 2017-11-20 DIAGNOSIS — D631 Anemia in chronic kidney disease: Secondary | ICD-10-CM | POA: Diagnosis not present

## 2017-11-22 ENCOUNTER — Encounter (INDEPENDENT_AMBULATORY_CARE_PROVIDER_SITE_OTHER): Payer: Self-pay | Admitting: Nurse Practitioner

## 2017-11-22 NOTE — Progress Notes (Signed)
Subjective:    Patient ID: Jeremy [name], male    DOB: 1955/05/04, 62 y.o.   MRN: [id number] Chief Complaint  Patient presents with  . Follow-up    Discuss PD cath removal    HPI  Patient presents today for discussion about removal of his PD catheter.  The patient underwent peritoneal dialysis for about 7 months or so, however the flow volumes are low and the patient has resumed hemodialysis.  The patient denies any issues with bleeding or access using his AV fistula.  Patient denies any shortness of breath, chest pain, or dizziness.  Denies any fever, chills, nausea, diarrhea.  Constitutional: [] Weight loss  [] Fever  [] Chills Cardiac: [] Chest pain   [] Chest pressure   [] Palpitations   [] Shortness of breath when laying flat   [] Shortness of breath with exertion. Vascular:  [] Pain in legs with walking   [] Pain in legs with standing  [] History of DVT   [] Phlebitis   [] Swelling in legs   [] Varicose veins   [] Non-healing ulcers Pulmonary:   [] Uses home oxygen   [] Productive cough   [] Hemoptysis   [] Wheeze  [] COPD   [] Asthma Neurologic:  [] Dizziness   [] Seizures   [] History of stroke   [] History of TIA  [] Aphasia   [] Vissual changes   [] Weakness or numbness in arm   [] Weakness or numbness in leg Musculoskeletal:   [] Joint swelling   [] Joint pain   [] Low back pain Hematologic:  [x] Easy bruising  [] Easy bleeding   [] Hypercoagulable state   [] Anemic Gastrointestinal:  [] Diarrhea   [] Vomiting  [] Gastroesophageal reflux/heartburn   [] Difficulty swallowing. Genitourinary:  [] Chronic kidney disease   [] Difficult urination  [] Frequent urination   [] Blood in urine Skin:  [] Rashes   [] Ulcers  Psychological:  [] History of anxiety   []  History of major depression.     Objective:   Physical Exam  BP 109/62 (BP Location: Right Arm, Patient Position: Sitting)   Pulse 68   Resp 13   Ht 5\' 10"  (1.778 m)   Wt 185 lb (83.9 kg)   BMI 26.54 kg/m   Past Medical History:  Diagnosis Date  . Anemia in  chronic kidney disease 07/04/2015  . Arthritis   . Chronic anticoagulation 06/2012  . Dialysis patient [facility]) 01 01 2008  . Dysrhythmia    A Fib  . Hx of colonoscopy    2009 by Dr. Laural Golden. Normal except for small submucosal lipoma. No evidence of polyps or colitis.  . Hyperlipidemia   . Hypertension   . Hypogonadism in male 07/25/2015  . 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  Marland Kitchen OSA (obstructive sleep apnea)    No CPAP  . Renal cell carcinoma ([place]) 08/04/2016     Gen: WD/WN, NAD Head: [place]/AT, No temporalis wasting.  Ear/Nose/Throat: Hearing grossly intact, nares w/o erythema or drainage Eyes: PER, EOMI, sclera nonicteric.  Neck: Supple, no masses.  No bruit or JVD.  Pulmonary:  Good air movement, clear to auscultation bilaterally, no use of accessory muscles.  Cardiac: RRR Vascular:  Left radiocephalic fistula, with Vessel Right Left  Radial 2+ 2+  Gastrointestinal: soft, non-distended. No guarding/no peritoneal signs.  PD cath in place. Musculoskeletal: M/S 5/5 throughout.  No deformity or atrophy.  Neurologic: Pain and light touch intact in extremities.  Symmetrical.  Speech is fluent. Motor exam as  listed above. Psychiatric: Judgment intact, Mood & affect appropriate for pt's clinical situation. Dermatologic: No Venous rashes no ulcers noted.  No changes consistent with cellulitis. Lymph : No Cervical lymphadenopathy, no lichenification or skin changes of chronic lymphedema.   Social History   Socioeconomic History  . Marital status: Divorced    Spouse name: Not on file  . Number of children: Not on file  . Years of education: Not on file  . Highest education level: Not on file  Occupational History  . Occupation: courier    Comment: [employer]  . Financial resource strain: Not  on file  . Food insecurity:    Worry: Not on file    Inability: Not on file  . Transportation needs:    Medical: Not on file    Non-medical: Not on file  Tobacco Use  . Smoking status: Never Smoker  . Smokeless tobacco: Never Used  Substance and Sexual Activity  . Alcohol use: No  . Drug use: No  . Sexual activity: Not Currently    Partners: Female  Lifestyle  . Physical activity:    Days per week: Not on file    Minutes per session: Not on file  . Stress: Not on file  Relationships  . Social connections:    Talks on phone: Not on file    Gets together: Not on file    Attends religious service: Not on file    Active member of club or organization: Not on file    Attends meetings of clubs or organizations: Not on file    Relationship status: Not on file  . Intimate partner violence:    Fear of current or ex partner: Not on file    Emotionally abused: Not on file    Physically abused: Not on file    Forced sexual activity: Not on file  Other Topics Concern  . Not on file  Social History Narrative   Single   1 daughter 58     Past Surgical History:  Procedure Laterality Date  . AORTIC VALVE REPLACEMENT  3 /11 /2014   At [facility]    . CAPD INSERTION N/A 02/19/2017   Procedure: LAPAROSCOPIC INSERTION CONTINUOUS AMBULATORY PERITONEAL DIALYSIS  (CAPD) CATHETER;  Surgeon: Algernon Huxley, MD;  Location: [facility] ORS;  Service: General;  Laterality: N/A;  . CORONARY ANGIOPLASTY WITH STENT PLACEMENT    . DG AV DIALYSIS  SHUNT ACCESS EXIST*L* OR     left arm  . EPIGASTRIC HERNIA REPAIR N/A 02/19/2017   Procedure: HERNIA REPAIR EPIGASTRIC ADULT;  Surgeon: Robert Bellow, MD;  Location: [facility] ORS;  Service: General;  Laterality: N/A;  . EYE SURGERY     bilateral cataract  . HERNIA REPAIR     UMBILICAL HERNIA  . HERNIA REPAIR  02/19/2017  . INNER EAR SURGERY     20 years ago  . KIDNEY TRANSPLANT    . Peritoneal Dialysis access  placement  02/19/2017  . TEE WITHOUT CARDIOVERSION N/A 07/21/2016   Procedure: TRANSESOPHAGEAL ECHOCARDIOGRAM (TEE);  Surgeon: Wellington Hampshire, MD;  Location: [facility] ORS;  Service: Cardiovascular;  Laterality: N/A;    Family History  Problem Relation Age of Onset  . Cancer Mother        pancreatic  . Heart disease Father   . Diabetes Father     Allergies  Allergen Reactions  . Statins     Other reaction(s): Arthralgias (intolerance),  Myalgias (intolerance)  . Sertraline Rash       Assessment & Plan:   1. ESRD on hemodialysis [facility])  Recommend:  The patient has advanced renal disease but currently has left radiocephalic AV fistula and would like to remove current peritoneal dialysis catheter.  Risk and benefits were reviewed the patient.  Indications for the procedure were reviewed.  All questions were answered, the patient agrees to proceed with laparoscopic PD catheter removal.  2. Hyperlipidemia, unspecified hyperlipidemia type Continue statin as ordered and reviewed, no changes at this time   3. Essential hypertension Continue antihypertensive medications as already ordered, these medications have been reviewed and there are no changes at this time.    Current Outpatient Medications on File Prior to Visit  Medication Sig Dispense Refill  . acetaminophen (TYLENOL) 500 MG tablet Take 1,000 mg 2 (two) times daily as needed by mouth for moderate pain.    Marland Kitchen allopurinol (ZYLOPRIM) 100 MG tablet Take 2 tablets (200 mg total) by mouth daily. 180 tablet 1  . calcitRIOL (ROCALTROL) 0.25 MCG capsule Take 0.25 mcg by mouth daily.     . Cholecalciferol (VITAMIN D3) 1000 units CAPS Take 2,000 Units every evening by mouth.    . cinacalcet (SENSIPAR) 60 MG tablet Take 30 mg by mouth daily. At dialysis    . Dextrose-Fructose-Sod Citrate (NAUZENE PO) Take 1-2 tablets 2 (two) times daily as needed by mouth (nausea).    . dicyclomine (BENTYL) 10 MG capsule Take 2 capsules (20 mg total) by  mouth 3 (three) times daily before meals. (Patient not taking: Reported on 11/17/2017) 90 capsule 4  . DiphenhydrAMINE HCl, Sleep, 50 MG CAPS Take 50 mg at bedtime as needed by mouth (sleep).    Marland Kitchen FLUoxetine (PROZAC) 10 MG capsule TAKE 1 CAPSULE BY MOUTH EVERY DAY 90 capsule 0  . lanthanum (FOSRENOL) 1000 MG chewable tablet Chew 2,000 mg 3 (three) times daily after meals by mouth.     . lidocaine-prilocaine (EMLA) cream Apply 1 application as needed topically (port access).    . metoprolol tartrate (LOPRESSOR) 100 MG tablet Take 0.5 tablets (50 mg total) by mouth 2 (two) times daily. 90 tablet 3  . Multiple Vitamin (MULTIVITAMIN WITH MINERALS) TABS tablet Take 1 tablet by mouth daily.    . nitroGLYCERIN (NITROSTAT) 0.4 MG SL tablet Place 1 tablet (0.4 mg total) under the tongue every 5 (five) minutes as needed for chest pain. 25 tablet 2  . Omega-3 Fatty Acids (FISH OIL) 1200 MG CAPS Take 1,200 mg 2 (two) times daily by mouth.    Marland Kitchen omeprazole (PRILOSEC) 20 MG capsule Take 1 capsule (20 mg total) by mouth daily. 90 capsule 1  . pravastatin (PRAVACHOL) 10 MG tablet TAKE ONE TABLET BY MOUTH ON MONDAY, WEDNESDAY, AND FRIDAY 36 tablet 0  . predniSONE (DELTASONE) 10 MG tablet Take 2 tabs (20mg ) daily through 7/15, then 1 & 1/2 tabs (15mg ) daily through 7/29, then 1 tab (10mg ) daily through 8/12, then 1/2 tab (5mg ) daily through 8/26, then stop    . rOPINIRole (REQUIP) 0.25 MG tablet Take 0.25 mg at bedtime as needed by mouth (restless legs).     . warfarin (COUMADIN) 1 MG tablet Take 1 tablet (1 mg total) by mouth daily. 180 tablet 1   No current facility-administered medications on file prior to visit.     There are no Patient Instructions on file for this visit. No follow-ups on file.   Kris Hartmann, NP

## 2017-11-23 ENCOUNTER — Other Ambulatory Visit (INDEPENDENT_AMBULATORY_CARE_PROVIDER_SITE_OTHER): Payer: Self-pay | Admitting: Nurse Practitioner

## 2017-11-23 ENCOUNTER — Ambulatory Visit: Payer: Medicare Other | Admitting: Anesthesiology

## 2017-11-23 ENCOUNTER — Encounter
Admission: RE | Disposition: A | Discharge status: Home / Self Care | Payer: Self-pay | Source: Ambulatory Visit | Attending: Vascular Surgery

## 2017-11-23 ENCOUNTER — Ambulatory Visit: Admit: 2017-11-23 | Payer: Medicare Other | Admitting: Vascular Surgery

## 2017-11-23 ENCOUNTER — Telehealth: Payer: Self-pay | Admitting: Pharmacist

## 2017-11-23 ENCOUNTER — Ambulatory Visit
Admission: RE | Admit: 2017-11-23 | Discharge: 2017-11-23 | Disposition: A | Discharge status: Home / Self Care | Payer: Medicare Other | Source: Ambulatory Visit | Attending: Vascular Surgery | Admitting: Vascular Surgery

## 2017-11-23 DIAGNOSIS — I2510 Atherosclerotic heart disease of native coronary artery without angina pectoris: Secondary | ICD-10-CM | POA: Insufficient documentation

## 2017-11-23 DIAGNOSIS — N186 End stage renal disease: Secondary | ICD-10-CM

## 2017-11-23 DIAGNOSIS — E785 Hyperlipidemia, unspecified: Secondary | ICD-10-CM | POA: Diagnosis not present

## 2017-11-23 DIAGNOSIS — M1990 Unspecified osteoarthritis, unspecified site: Secondary | ICD-10-CM | POA: Insufficient documentation

## 2017-11-23 DIAGNOSIS — F329 Major depressive disorder, single episode, unspecified: Secondary | ICD-10-CM | POA: Diagnosis not present

## 2017-11-23 DIAGNOSIS — D631 Anemia in chronic kidney disease: Secondary | ICD-10-CM | POA: Insufficient documentation

## 2017-11-23 DIAGNOSIS — I120 Hypertensive chronic kidney disease with stage 5 chronic kidney disease or end stage renal disease: Secondary | ICD-10-CM | POA: Diagnosis not present

## 2017-11-23 DIAGNOSIS — G4733 Obstructive sleep apnea (adult) (pediatric): Secondary | ICD-10-CM | POA: Diagnosis not present

## 2017-11-23 DIAGNOSIS — Z4902 Encounter for fitting and adjustment of peritoneal dialysis catheter: Secondary | ICD-10-CM | POA: Diagnosis not present

## 2017-11-23 DIAGNOSIS — I4891 Unspecified atrial fibrillation: Secondary | ICD-10-CM | POA: Diagnosis not present

## 2017-11-23 DIAGNOSIS — N2581 Secondary hyperparathyroidism of renal origin: Secondary | ICD-10-CM | POA: Diagnosis not present

## 2017-11-23 DIAGNOSIS — Z940 Kidney transplant status: Secondary | ICD-10-CM | POA: Insufficient documentation

## 2017-11-23 HISTORY — PX: REMOVAL OF A DIALYSIS CATHETER: SHX6053

## 2017-11-23 LAB — PROTIME-INR
INR: 1.62
PROTHROMBIN TIME: 19.1 s — AB (ref 11.4–15.2)

## 2017-11-23 SURGERY — REMOVAL, DIALYSIS CATHETER
Anesthesia: General | Wound class: Clean Contaminated

## 2017-11-23 SURGERY — DIALYSIS/PERMA CATHETER REMOVAL
Anesthesia: LOCAL

## 2017-11-23 MED ORDER — SODIUM CHLORIDE 0.9 % IV SOLN: INTRAVENOUS | Status: DC

## 2017-11-23 MED ORDER — BUPIVACAINE-EPINEPHRINE (PF) 0.5% -1:200000 IJ SOLN
INTRAMUSCULAR | Status: AC
  Filled 2017-11-23: qty 30

## 2017-11-23 MED ORDER — ONDANSETRON HCL 4 MG/2ML IJ SOLN
4.0000 mg | Freq: Four times a day (QID) | INTRAMUSCULAR | Status: DC | PRN
Start: 2017-11-23 — End: 2017-11-23

## 2017-11-23 MED ORDER — BUPIVACAINE-EPINEPHRINE (PF) 0.5% -1:200000 IJ SOLN
INTRAMUSCULAR | Status: DC | PRN
  Administered 2017-11-23: 14 mL via PERINEURAL

## 2017-11-23 MED ORDER — LACTATED RINGERS IV SOLN
INTRAVENOUS | Status: DC | PRN
  Administered 2017-11-23: 15:00:00 via INTRAVENOUS

## 2017-11-23 MED ORDER — HYDROCODONE-ACETAMINOPHEN 5-325 MG PO TABS: 1.0000 | ORAL_TABLET | Freq: Four times a day (QID) | ORAL | 0 refills | Status: DC | PRN

## 2017-11-23 MED ORDER — HYDROMORPHONE HCL 1 MG/ML IJ SOLN: 1.0000 mg | Freq: Once | INTRAMUSCULAR | Status: DC

## 2017-11-23 MED ORDER — CEFAZOLIN SODIUM-DEXTROSE 1-4 GM/50ML-% IV SOLN: 1.0000 g | Freq: Once | INTRAVENOUS | Status: DC

## 2017-11-23 MED ORDER — HYDROCODONE-ACETAMINOPHEN 5-325 MG PO TABS
ORAL_TABLET | ORAL | Status: AC
  Filled 2017-11-23: qty 1

## 2017-11-23 MED ORDER — CEFAZOLIN SODIUM-DEXTROSE 1-4 GM/50ML-% IV SOLN
INTRAVENOUS | Status: DC | PRN
  Administered 2017-11-23: 1 g via INTRAVENOUS

## 2017-11-23 MED ORDER — PROPOFOL 10 MG/ML IV BOLUS
INTRAVENOUS | Status: DC | PRN
  Administered 2017-11-23: 70 mg via INTRAVENOUS

## 2017-11-23 MED ORDER — HYDROCODONE-ACETAMINOPHEN 5-325 MG PO TABS
1.0000 | ORAL_TABLET | Freq: Four times a day (QID) | ORAL | Status: DC | PRN
  Administered 2017-11-23: 1 via ORAL

## 2017-11-23 MED ORDER — PROPOFOL 500 MG/50ML IV EMUL
INTRAVENOUS | Status: DC | PRN
  Administered 2017-11-23: 140 ug/kg/min via INTRAVENOUS

## 2017-11-23 SURGICAL SUPPLY — 21 items
CANISTER SUCT 1200ML W/VALVE (MISCELLANEOUS) ×3 IMPLANT
CHLORAPREP W/TINT 26ML (MISCELLANEOUS) ×3 IMPLANT
DERMABOND ADVANCED (GAUZE/BANDAGES/DRESSINGS) ×2
DERMABOND ADVANCED .7 DNX12 (GAUZE/BANDAGES/DRESSINGS) ×1 IMPLANT
DRAPE LAPAROTOMY TRNSV 106X77 (MISCELLANEOUS) ×3 IMPLANT
ELECT REM PT RETURN 9FT ADLT (ELECTROSURGICAL) ×3
ELECTRODE REM PT RTRN 9FT ADLT (ELECTROSURGICAL) ×1 IMPLANT
GAUZE SPONGE 4X4 12PLY STRL (GAUZE/BANDAGES/DRESSINGS) ×3 IMPLANT
GLOVE BIO SURGEON STRL SZ7 (GLOVE) ×3 IMPLANT
GOWN STRL REUS W/ TWL LRG LVL3 (GOWN DISPOSABLE) ×1 IMPLANT
GOWN STRL REUS W/ TWL XL LVL3 (GOWN DISPOSABLE) ×1 IMPLANT
GOWN STRL REUS W/TWL LRG LVL3 (GOWN DISPOSABLE) ×2
GOWN STRL REUS W/TWL XL LVL3 (GOWN DISPOSABLE) ×2
KIT TURNOVER KIT A (KITS) ×3 IMPLANT
LABEL OR SOLS (LABEL) ×3 IMPLANT
NEEDLE HYPO 25X1 1.5 SAFETY (NEEDLE) ×3 IMPLANT
NS IRRIG 500ML POUR BTL (IV SOLUTION) ×3 IMPLANT
PACK BASIN MINOR ARMC (MISCELLANEOUS) ×3 IMPLANT
SUT MNCRL AB 4-0 PS2 18 (SUTURE) ×3 IMPLANT
SUT VICRYL+ 3-0 36IN CT-1 (SUTURE) ×3 IMPLANT
SYR 10ML LL (SYRINGE) ×3 IMPLANT

## 2017-11-23 NOTE — H&P (Signed)
[facility] History & Physical Update  The patient was interviewed and re-examined.  The patient's previous History and Physical has been reviewed and is unchanged.  There is no change in the plan of care. We plan to proceed with the scheduled procedure.  Leotis Pain, MD  11/23/2017, 2:40 PM

## 2017-11-23 NOTE — Anesthesia Preprocedure Evaluation (Addendum)
Anesthesia Evaluation  Patient identified by MRN, date of birth, ID band Patient awake    Reviewed: Allergy & Precautions, H&P , NPO status , Patient's Chart, lab work & pertinent test results  Airway Mallampati: III  TM Distance: >3 FB Neck ROM: full    Dental  (+) Teeth Intact   Pulmonary neg pulmonary ROS, sleep apnea ,    breath sounds clear to auscultation       Cardiovascular hypertension, + CAD  negative cardio ROS  + dysrhythmias Atrial Fibrillation  Rhythm:irregular Rate:Normal     Neuro/Psych PSYCHIATRIC DISORDERS Depression  Neuromuscular disease (lumbar radiculopathy) negative neurological ROS  negative psych ROS   GI/Hepatic negative GI ROS, Neg liver ROS,   Endo/Other  negative endocrine ROS  Renal/GU ESRFRenal diseasenegative Renal ROS  negative genitourinary   Musculoskeletal  (+) Arthritis ,   Abdominal   Peds  Hematology negative hematology ROS (+) anemia ,   Anesthesia Other Findings Past Medical History: 07/04/2015: Anemia in chronic kidney disease No date: Arthritis 06/2012: Chronic anticoagulation 01 01 2008: Dialysis patient ([place]) No date: Dysrhythmia     Comment:  A Fib No date: Hx of colonoscopy     Comment:  2009 by Dr. Laural Golden. Normal except for small submucosal               lipoma. No evidence of polyps or colitis. 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 Kidney transplant in 2000 and               renal cell carcinoma and transplanted kidney removed               along with his own diseased kidneys. No date: OSA (obstructive sleep apnea)     Comment:  No CPAP 08/04/2016: Renal cell carcinoma [facility])  Past Surgical History: 3 /11 /2014: AORTIC VALVE REPLACEMENT     Comment:  At [facility] No date: BACK SURGERY 02/19/2017: CAPD INSERTION; N/A     Comment:  Procedure: LAPAROSCOPIC INSERTION CONTINUOUS AMBULATORY               PERITONEAL DIALYSIS  (CAPD) CATHETER;  Surgeon: Algernon Huxley, MD;  Location: [facility] ORS;  Service: General;                Laterality: N/A; No date: CORONARY ANGIOPLASTY WITH STENT PLACEMENT No date: DG AV DIALYSIS  SHUNT ACCESS EXIST*L* OR     Comment:  left arm 02/19/2017: EPIGASTRIC HERNIA REPAIR; N/A     Comment:  Procedure: HERNIA REPAIR EPIGASTRIC ADULT;  Surgeon:               Robert Bellow, MD;  Location: [facility] ORS;  Service:               General;  Laterality: N/A; No date: EYE SURGERY     Comment:  bilateral cataract No date: HERNIA REPAIR     Comment:  UMBILICAL HERNIA 47/65/4650: HERNIA REPAIR No date: INNER EAR SURGERY     Comment:  20 years ago No date: KIDNEY TRANSPLANT 02/19/2017: Peritoneal Dialysis access placement 07/21/2016: TEE WITHOUT CARDIOVERSION; N/A     Comment:  Procedure:  TRANSESOPHAGEAL ECHOCARDIOGRAM (TEE);                Surgeon: Wellington Hampshire, MD;  Location: [facility] ORS;                Service: Cardiovascular;  Laterality: N/A;  BMI    Body Mass Index:  26.54 kg/m      Reproductive/Obstetrics negative OB ROS                            Anesthesia Physical Anesthesia Plan  ASA: III  Anesthesia Plan: General   Post-op Pain Management:    Induction:   PONV Risk Score and Plan: Propofol infusion  Airway Management Planned:   Additional Equipment:   Intra-op Plan:   Post-operative Plan:   Informed Consent: I have reviewed the patients History and Physical, chart, labs and discussed the procedure including the risks, benefits and alternatives for the proposed anesthesia with the patient or authorized representative who has indicated his/her understanding and acceptance.   Dental Advisory Given  Plan Discussed with: Anesthesiologist, CRNA and  Surgeon  Anesthesia Plan Comments:        Anesthesia Quick Evaluation

## 2017-11-23 NOTE — Transfer of Care (Signed)
Immediate Anesthesia Transfer of Care Note  Patient: Jeremy [name]  Procedure(s) Performed: REMOVAL OF A PERITONEAL DIALYSIS CATHETER (N/A )  Patient Location: PACU  Anesthesia Type:General  Level of Consciousness: sedated  Airway & Oxygen Therapy: Patient Spontanous Breathing and Patient connected to face mask oxygen  Post-op Assessment: Report given to RN and Post -op Vital signs reviewed and stable  Post vital signs: Reviewed and stable  Last Vitals:  Vitals Value Taken Time  BP 90/63 11/23/2017  3:26 PM  Temp    Pulse 85 11/23/2017  3:27 PM  Resp 17 11/23/2017  3:27 PM  SpO2 100 % 11/23/2017  3:27 PM  Vitals shown include unvalidated device data.  Last Pain:  Vitals:   11/23/17 1403  TempSrc: Tympanic  PainSc: 0-No pain         Complications: No apparent anesthesia complications

## 2017-11-23 NOTE — Discharge Instructions (Signed)

## 2017-11-23 NOTE — Telephone Encounter (Signed)
Contacted patient for weekly INR check. He notes that d/t removal of PD catheter, he has been off warfarin for a few days and will restart tomorrow.   Plan - Resume current dosing schedule, recheck INR next Monday, 8/26.   Catie Darnelle Maffucci, PharmD PGY2 Ambulatory Care Pharmacy Resident Phone: [phone]

## 2017-11-23 NOTE — Anesthesia Post-op Follow-up Note (Signed)
Anesthesia QCDR form completed.        

## 2017-11-23 NOTE — Telephone Encounter (Signed)
I have reviewed the above note and agree. I was available to the pharmacist for consultation.  Tommi Rumps, MD

## 2017-11-23 NOTE — Op Note (Signed)
[facility]   OPERATIVE NOTE  DATE: 11/23/2017  PRE-OPERATIVE DIAGNOSIS: ESRD, functional AVF, poorly functional PD catheter  POST-OPERATIVE DIAGNOSIS: same as above  PROCEDURE: 1.   Removal of peritoneal dialysis catheter  SURGEON: Leotis Pain, MD  ASSISTANT(S): none  ANESTHESIA: general  ESTIMATED BLOOD LOSS: 3 cc  FINDING(S): 1.  none  SPECIMEN(S):  PD catheter  INDICATIONS:   Patient is a 62 y.o.male with renal failure.  He had a PD catheter placed and tried peritoneal dialysis but the clearances were poor and he has switched to hemodialysis.  The peritoneal catheter needs to be removed.  Risks and benefits are discussed.  DESCRIPTION: After obtaining full informed written consent, the patient was brought back to the operating room and placed supine upon the operating table.  The patient received IV antibiotics prior to induction.  After obtaining adequate anesthesia, the patient was prepped and draped in the standard fashion. The previous incision beside the umbilicus were the deep cuff was located was reopened, and the cuff was dissected out at the fascia without difficulty.  The catheter was then removed from the peritoneal cavity and a 0 Vicryl pursestring suture was used to close this opening.  The superficial cuff was then dissected out and freed and the catheter was then transected and removed from the field.  The transverse periumbilical incision was then closed with a 3-0 Vicryl and a 4-0 Monocryl and dermabond was placed.  A dry dressing was placed at the previous catheter exit site.  The patient was then awakened from anesthesia and taken to the recovery room in stable condition.  COMPLICATIONS: None  CONDITION: Stable   Leotis Pain 11/23/2017 3:27 PM

## 2017-11-24 DIAGNOSIS — C642 Malignant neoplasm of left kidney, except renal pelvis: Secondary | ICD-10-CM | POA: Diagnosis not present

## 2017-11-24 DIAGNOSIS — I132 Hypertensive heart and chronic kidney disease with heart failure and with stage 5 chronic kidney disease, or end stage renal disease: Secondary | ICD-10-CM | POA: Diagnosis not present

## 2017-11-24 DIAGNOSIS — I509 Heart failure, unspecified: Secondary | ICD-10-CM | POA: Diagnosis not present

## 2017-11-24 DIAGNOSIS — Z905 Acquired absence of kidney: Secondary | ICD-10-CM | POA: Diagnosis not present

## 2017-11-24 DIAGNOSIS — Z952 Presence of prosthetic heart valve: Secondary | ICD-10-CM | POA: Diagnosis not present

## 2017-11-24 DIAGNOSIS — Z85528 Personal history of other malignant neoplasm of kidney: Secondary | ICD-10-CM | POA: Diagnosis not present

## 2017-11-24 DIAGNOSIS — Z7901 Long term (current) use of anticoagulants: Secondary | ICD-10-CM | POA: Diagnosis not present

## 2017-11-24 DIAGNOSIS — K7689 Other specified diseases of liver: Secondary | ICD-10-CM | POA: Diagnosis not present

## 2017-11-24 DIAGNOSIS — T8612 Kidney transplant failure: Secondary | ICD-10-CM | POA: Diagnosis not present

## 2017-11-24 DIAGNOSIS — D631 Anemia in chronic kidney disease: Secondary | ICD-10-CM | POA: Diagnosis not present

## 2017-11-24 DIAGNOSIS — R918 Other nonspecific abnormal finding of lung field: Secondary | ICD-10-CM | POA: Diagnosis not present

## 2017-11-24 DIAGNOSIS — E041 Nontoxic single thyroid nodule: Secondary | ICD-10-CM | POA: Diagnosis not present

## 2017-11-24 DIAGNOSIS — C649 Malignant neoplasm of unspecified kidney, except renal pelvis: Secondary | ICD-10-CM | POA: Diagnosis not present

## 2017-11-24 DIAGNOSIS — R9389 Abnormal findings on diagnostic imaging of other specified body structures: Secondary | ICD-10-CM | POA: Diagnosis not present

## 2017-11-24 DIAGNOSIS — C641 Malignant neoplasm of right kidney, except renal pelvis: Secondary | ICD-10-CM | POA: Diagnosis not present

## 2017-11-24 DIAGNOSIS — Z992 Dependence on renal dialysis: Secondary | ICD-10-CM | POA: Diagnosis not present

## 2017-11-24 DIAGNOSIS — N186 End stage renal disease: Secondary | ICD-10-CM | POA: Diagnosis not present

## 2017-11-24 DIAGNOSIS — Z08 Encounter for follow-up examination after completed treatment for malignant neoplasm: Secondary | ICD-10-CM | POA: Diagnosis not present

## 2017-11-24 DIAGNOSIS — Z940 Kidney transplant status: Secondary | ICD-10-CM | POA: Diagnosis not present

## 2017-11-24 LAB — POCT I-STAT 4, (NA,K, GLUC, HGB,HCT)
Glucose, Bld: 96 mg/dL (ref 70–99)
HCT: 31 % — ABNORMAL LOW (ref 39.0–52.0)
Hemoglobin: 10.5 g/dL — ABNORMAL LOW (ref 13.0–17.0)
POTASSIUM: 3.8 mmol/L (ref 3.5–5.1)
SODIUM: 137 mmol/L (ref 135–145)

## 2017-11-24 NOTE — Anesthesia Postprocedure Evaluation (Signed)
Anesthesia Post Note  Patient: Jeremy [name]  Procedure(s) Performed: REMOVAL OF A PERITONEAL DIALYSIS CATHETER (N/A )  Patient location during evaluation: PACU Anesthesia Type: General Level of consciousness: awake and alert Pain management: pain level controlled Vital Signs Assessment: post-procedure vital signs reviewed and stable Respiratory status: spontaneous breathing, nonlabored ventilation, respiratory function stable and patient connected to nasal cannula oxygen Cardiovascular status: blood pressure returned to baseline and stable Postop Assessment: no apparent nausea or vomiting Anesthetic complications: no     Last Vitals:  Vitals:   11/23/17 1630 11/23/17 1653  BP: (!) 146/65 (!) 131/54  Pulse: 67 (!) 58  Resp: 16 16  Temp: 36.9 C   SpO2: 100% 100%    Last Pain:  Vitals:   11/23/17 1653  TempSrc:   PainSc: 3                  Martha Clan

## 2017-11-25 ENCOUNTER — Telehealth: Payer: Self-pay

## 2017-11-25 DIAGNOSIS — N2581 Secondary hyperparathyroidism of renal origin: Secondary | ICD-10-CM | POA: Diagnosis not present

## 2017-11-25 DIAGNOSIS — D631 Anemia in chronic kidney disease: Secondary | ICD-10-CM | POA: Diagnosis not present

## 2017-11-25 DIAGNOSIS — N186 End stage renal disease: Secondary | ICD-10-CM | POA: Diagnosis not present

## 2017-11-25 NOTE — Telephone Encounter (Signed)
Attempted to contact Jeremy [name] without success; left message on voicemail [phone] for Jeremy [name] to call the office.

## 2017-11-25 NOTE — Telephone Encounter (Signed)
Left message to return call, ok for pec to speak to [facility] and get more details

## 2017-11-25 NOTE — Telephone Encounter (Signed)
Copied from [place] [id number]. Topic: General - Other >> Nov 25, 2017 10:21 AM Adelene Idler wrote: Colletta Maryland from [facility] called asking to speak with Janett Billow concerning pt.  Cb# [phone]

## 2017-11-26 ENCOUNTER — Telehealth: Payer: Self-pay

## 2017-11-26 DIAGNOSIS — R918 Other nonspecific abnormal finding of lung field: Secondary | ICD-10-CM

## 2017-11-26 NOTE — Telephone Encounter (Signed)
Thersa Salt, RN, from [facility] called and states that pt had CT per Dr Kendall Flack, results of CT says "interval development of patchy peripheral consolidation in both lower lung lobes, concerning for pneumonia; pt is currently asymptomatic; also says results can be seen in "Care Everywhere"; Colletta Maryland also said that these results are deferred to Dr Caryl Bis; notified Larena Glassman at [facility] of these results; she will route them to Dr Caryl Bis for review.

## 2017-11-26 NOTE — Telephone Encounter (Signed)
Referral placed.  I will forward to Melissa to call [facility] or [facility] pulmonology to try to get the patient in to be evaluated for this.  A sooner appointment would be preferable.

## 2017-11-26 NOTE — Telephone Encounter (Signed)
I have already routed a message to Dr. Caryl Bis about this. I did not want to send a duplicate but I did not have this message at the time. Routing to you in case you need the phone numbers out of this encounter

## 2017-11-26 NOTE — Telephone Encounter (Signed)
Patient had CT done ordered by Dr. Kendall Flack on 11/24/2017. 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 Patient is asymptomatic but they are wanting to defer to PCP to determine further treatment/evaluation.

## 2017-11-26 NOTE — Addendum Note (Signed)
Addended by: Leone Haven on: 11/26/2017 06:29 PM   Modules accepted: Orders

## 2017-11-26 NOTE — Telephone Encounter (Signed)
Please follow-up with the patient to confirm that he does not have fevers, cough, chest pain, or shortness of breath. If he does not have any symptoms we need to get him to see pulmonology for evaluation. If he has symptoms he needs to be evaluated. Thanks.

## 2017-11-26 NOTE — Telephone Encounter (Signed)
See other phone note. Please contact the patient to confirm he is not having any symptoms.

## 2017-11-26 NOTE — Telephone Encounter (Signed)
Patient states he is not having any symptoms. He would like a referral to pulmonology and it will need to be on a Tuesday or Thursday. Informed patient if he develops any symptoms he will need to be evaluated. Patient verbalized understaning

## 2017-11-26 NOTE — Telephone Encounter (Signed)
fyi

## 2017-11-27 DIAGNOSIS — D631 Anemia in chronic kidney disease: Secondary | ICD-10-CM | POA: Diagnosis not present

## 2017-11-27 DIAGNOSIS — N2581 Secondary hyperparathyroidism of renal origin: Secondary | ICD-10-CM | POA: Diagnosis not present

## 2017-11-27 DIAGNOSIS — N186 End stage renal disease: Secondary | ICD-10-CM | POA: Diagnosis not present

## 2017-11-27 NOTE — Telephone Encounter (Signed)
Sent to [facility] pulm.

## 2017-11-30 ENCOUNTER — Ambulatory Visit (INDEPENDENT_AMBULATORY_CARE_PROVIDER_SITE_OTHER): Payer: Medicare Other | Admitting: Pharmacist

## 2017-11-30 DIAGNOSIS — I481 Persistent atrial fibrillation: Secondary | ICD-10-CM | POA: Diagnosis not present

## 2017-11-30 DIAGNOSIS — Z952 Presence of prosthetic heart valve: Secondary | ICD-10-CM | POA: Diagnosis not present

## 2017-11-30 DIAGNOSIS — I359 Nonrheumatic aortic valve disorder, unspecified: Secondary | ICD-10-CM | POA: Diagnosis not present

## 2017-11-30 DIAGNOSIS — Z7901 Long term (current) use of anticoagulants: Secondary | ICD-10-CM

## 2017-11-30 DIAGNOSIS — N186 End stage renal disease: Secondary | ICD-10-CM | POA: Diagnosis not present

## 2017-11-30 DIAGNOSIS — I4819 Other persistent atrial fibrillation: Secondary | ICD-10-CM

## 2017-11-30 DIAGNOSIS — D631 Anemia in chronic kidney disease: Secondary | ICD-10-CM | POA: Diagnosis not present

## 2017-11-30 DIAGNOSIS — N2581 Secondary hyperparathyroidism of renal origin: Secondary | ICD-10-CM | POA: Diagnosis not present

## 2017-11-30 LAB — POCT INR: INR: 1.5 — AB (ref 2.0–3.0)

## 2017-11-30 NOTE — Progress Notes (Signed)
I have reviewed the above note and agree. I was available to the pharmacist for consultation.  Tommi Rumps, MD

## 2017-12-01 ENCOUNTER — Other Ambulatory Visit: Payer: Self-pay | Admitting: Physician Assistant

## 2017-12-01 ENCOUNTER — Telehealth: Payer: Self-pay | Admitting: Family Medicine

## 2017-12-01 DIAGNOSIS — M8580 Other specified disorders of bone density and structure, unspecified site: Secondary | ICD-10-CM

## 2017-12-01 NOTE — Telephone Encounter (Signed)
fyi

## 2017-12-01 NOTE — Telephone Encounter (Signed)
Copied from [place] [id number]. Topic: Quick Communication - See Telephone Encounter >> Dec 01, 2017 11:30 AM Gardiner Ramus wrote: CRM for notification. See Telephone encounter for: 12/01/17. Carla From [facility] called to report out of range INR. INR = 1.5. This was taken 11/30/17

## 2017-12-01 NOTE — Telephone Encounter (Signed)
Noted yesterday by the clinical pharmacist. See her note for details.

## 2017-12-01 NOTE — Telephone Encounter (Signed)
PT/INR results

## 2017-12-02 DIAGNOSIS — N186 End stage renal disease: Secondary | ICD-10-CM | POA: Diagnosis not present

## 2017-12-02 DIAGNOSIS — D631 Anemia in chronic kidney disease: Secondary | ICD-10-CM | POA: Diagnosis not present

## 2017-12-02 DIAGNOSIS — N2581 Secondary hyperparathyroidism of renal origin: Secondary | ICD-10-CM | POA: Diagnosis not present

## 2017-12-03 ENCOUNTER — Encounter: Payer: Self-pay | Admitting: Internal Medicine

## 2017-12-03 ENCOUNTER — Ambulatory Visit (INDEPENDENT_AMBULATORY_CARE_PROVIDER_SITE_OTHER): Payer: Medicare Other | Admitting: Internal Medicine

## 2017-12-03 VITALS — BP 108/58 | HR 78 | Resp 16 | Ht 70.0 in | Wt 188.0 lb

## 2017-12-03 DIAGNOSIS — I2510 Atherosclerotic heart disease of native coronary artery without angina pectoris: Secondary | ICD-10-CM

## 2017-12-03 DIAGNOSIS — J849 Interstitial pulmonary disease, unspecified: Secondary | ICD-10-CM | POA: Diagnosis not present

## 2017-12-03 NOTE — Patient Instructions (Signed)
Patient needs CT chest to assess Lungs

## 2017-12-03 NOTE — Progress Notes (Signed)
Name: Jeremy [name] MRN: [id number] DOB: 10-10-1955     CONSULTATION DATE: 8.29.19 REFERRING MD : Caryl Bis  CHIEF COMPLAINT: SOB  STUDIES:   REPORTS  CT chest  Interstitial lung disease per report Images not available  HISTORY OF PRESENT ILLNESS: 62 year old pleasant white male seen today for report of an abnormal CT scan of his chest in the setting of progressive shortness of breath and dyspnea on exertion for the past 1 year Associated with weakness and fatigue  Patient describes that his shortness of breath and dyspnea on exertion is only at extreme exercise Which he noticed progressively worsening over the last year  He has no evidence of infection at this time no signs of pneumonia He denies cough He denies wheezing  Patient has significant cardiac history in the setting of aortic valve replacement on Coumadin therapy Also has a history of atrial fibrillation and has cardiac stents consistent with CAD  At this time patient also has significant kidney history-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 Transplant #1 lasted approximately 9 years Transplant #2 in 2014 lasted about 3 years  Since then patient has tried peritoneal dialysis but was not working and is currently on hemodialysis and has a fistula in his left arm   Patient is a non-smoker No secondhand smoke exposure noted 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  Patient also has a history of severe osteoarthritis in his hands and sees a rheumatologist in [place] and he takes intermittent prednisone for pain  It seems that his shortness of breath and dyspnea on exertion is not really impeding his daily activities at this time supposedly he had a CT of his chest at [facility] but there are no images available at this time patient requests that he obtain a CT of his chest for interval changes    PAST MEDICAL HISTORY :   has a past medical history of Anemia in  chronic kidney disease (07/04/2015), Arthritis, Chronic anticoagulation (06/2012), Dialysis patient [facility]) (01 01 2008), Dysrhythmia, colonoscopy, Hyperlipidemia, Hypertension, Hypogonadism in male (07/25/2015), Kidney disease, OSA (obstructive sleep apnea), and Renal cell carcinoma ([place]) (08/04/2016).  has a past surgical history that includes DG AV DIALYSIS  SHUNT ACCESS EXIST*L* OR; Back surgery; Coronary angioplasty with stent; Kidney transplant; Aortic valve replacement (3 /11 /2014); TEE without cardioversion (N/A, 07/21/2016); Hernia repair; Eye surgery; Inner ear surgery; epigastric hernia repair (N/A, 02/19/2017); CAPD insertion (N/A, 02/19/2017); Hernia repair (02/19/2017); Peritoneal Dialysis access placement (02/19/2017); and Removal of a dialysis catheter (N/A, 11/23/2017). Prior to Admission medications   Medication Sig Start Date End Date Taking? Authorizing Provider  acetaminophen (TYLENOL) 500 MG tablet Take 1,000 mg 2 (two) times daily as needed by mouth for moderate pain.   Yes [provider]  allopurinol (ZYLOPRIM) 100 MG tablet Take 2 tablets (200 mg total) by mouth daily. 05/31/17  Yes Leone Haven, MD  cinacalcet (SENSIPAR) 60 MG tablet Take 30 mg by mouth daily. At dialysis   Yes [provider]  Dextrose-Fructose-Sod Citrate (NAUZENE PO) Take 1-2 tablets 2 (two) times daily as needed by mouth (nausea).   Yes [provider]  dicyclomine (BENTYL) 10 MG capsule Take 2 capsules (20 mg total) by mouth 3 (three) times daily before meals. 11/14/15  Yes Setzer, Terri L, NP  DiphenhydrAMINE HCl, Sleep, 50 MG CAPS Take 50 mg at bedtime as needed by mouth (sleep).   Yes [provider]  FLUoxetine (PROZAC) 10  MG capsule TAKE 1 CAPSULE BY MOUTH EVERY DAY 10/29/17  Yes Leone Haven, MD  HYDROcodone-acetaminophen (NORCO) 5-325 MG tablet Take 1 tablet by mouth every 6 (six) hours as needed for moderate pain. 11/23/17  Yes Dew, Erskine Squibb, MD    lidocaine-prilocaine (EMLA) cream Apply 1 application as needed topically (port access).   Yes [provider]  metoprolol tartrate (LOPRESSOR) 100 MG tablet Take 0.5 tablets (50 mg total) by mouth 2 (two) times daily. 11/17/17  Yes Leone Haven, MD  Multiple Vitamin (MULTIVITAMIN WITH MINERALS) TABS tablet Take 1 tablet by mouth daily.   Yes [provider]  nitroGLYCERIN (NITROSTAT) 0.4 MG SL tablet Place 1 tablet (0.4 mg total) under the tongue every 5 (five) minutes as needed for chest pain. 04/09/17  Yes Leone Haven, MD  Omega-3 Fatty Acids (FISH OIL) 1200 MG CAPS Take 1,200 mg 2 (two) times daily by mouth.   Yes [provider]  omeprazole (PRILOSEC) 20 MG capsule Take 1 capsule (20 mg total) by mouth daily. 05/31/17  Yes Leone Haven, MD  pravastatin (PRAVACHOL) 10 MG tablet TAKE ONE TABLET BY MOUTH ON MONDAY, [facility], AND FRIDAY 10/29/17  Yes Leone Haven, MD  predniSONE (DELTASONE) 10 MG tablet Take 2 tabs (20mg ) daily through 7/15, then 1 & 1/2 tabs (15mg ) daily through 7/29, then 1 tab (10mg ) daily through 8/12, then 1/2 tab (5mg ) daily through 8/26, then stop 10/12/16  Yes [provider]  warfarin (COUMADIN) 1 MG tablet Take 1 tablet (1 mg total) by mouth daily. 05/31/17  Yes Leone Haven, MD   Allergies  Allergen Reactions  . Statins     Other reaction(s): Arthralgias (intolerance), Myalgias (intolerance)  . Sertraline Rash    FAMILY HISTORY:  family history includes Cancer in his mother; Diabetes in his father; Heart disease in his father. SOCIAL HISTORY:  reports that he has never smoked. He has never used smokeless tobacco. He reports that he does not drink alcohol or use drugs.  REVIEW OF SYSTEMS:   Constitutional: Negative for fever, chills, weight loss, malaise/fatigue and diaphoresis.  HENT: Negative for hearing loss, ear pain, nosebleeds, congestion, sore throat, neck pain, tinnitus and ear discharge.    Eyes: Negative for blurred vision, double vision, photophobia, pain, discharge and redness.  Respiratory: Negative for cough, hemoptysis, sputum production, + shortness of breath, wheezing and stridor.   Cardiovascular: Negative for chest pain, palpitations, orthopnea, claudication, leg swelling and PND.  Gastrointestinal: Negative for heartburn, nausea, vomiting, abdominal pain, diarrhea, constipation, blood in stool and melena.  Genitourinary: Negative for dysuria, urgency, frequency, hematuria and flank pain.  Musculoskeletal: Negative for myalgias, back pain, joint pain and falls.  Skin: Negative for itching and rash.  Neurological: Negative for dizziness, tingling, tremors, sensory change, speech change, focal weakness, seizures, loss of consciousness, weakness and headaches.  Endo/Heme/Allergies: Negative for environmental allergies and polydipsia. Does not bruise/bleed easily.  ALL OTHER ROS ARE NEGATIVE   BP (!) 108/58 (BP Location: Left Arm, Cuff Size: Large)   Pulse 78   Resp 16   Ht 5\' 10"  (1.778 m)   Wt 188 lb (85.3 kg)   SpO2 99%   BMI 26.98 kg/m    Physical Examination:   GENERAL:NAD, no fevers, chills, no weakness no fatigue HEAD: Normocephalic, atraumatic.  EYES: Pupils equal, round, reactive to light. Extraocular muscles intact. No scleral icterus.  MOUTH: Moist mucosal membrane.   EAR, NOSE, THROAT: Clear without exudates. No external lesions.  NECK: Supple. No thyromegaly. No nodules. No JVD.  PULMONARY:CTA B/L no wheezes, no crackles, no rhonchi +bruit in left arm CARDIOVASCULAR: S1 and S2. IR-Regular rate and rhythm. No murmurs, rubs, or gallops. No edema.  +afib GASTROINTESTINAL: Soft, nontender, nondistended. No masses. Positive bowel sounds.  MUSCULOSKELETAL: No swelling, clubbing, or edema. Range of motion full in all extremities.  +thrill left arm,  NEUROLOGIC: Cranial nerves II through XII are intact. No gross focal neurological deficits.  SKIN: No  ulceration, lesions, rashes, or cyanosis. Skin warm and dry. Turgor intact.  PSYCHIATRIC: Mood, affect within normal limits. The patient is awake, alert and oriented x 3. Insight, judgment intact.      ASSESSMENT / PLAN: 62 year old pleasant white male seen today for a report of an abnormal CT chest with probable underlying interstitial lung disease in the setting of significant cardiac history with aortic valve replacement with A. 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  At this time I would not recommend any type of inhaler therapy or steroids or prednisone at this time  Obtain CT chest and follow up in 3 months   Patient  satisfied with Plan of action and management. All questions answered  Corrin Parker, M.D.  [facility]  Medical Director [place] Director [facility] Cardio-Pulmonary Department

## 2017-12-04 ENCOUNTER — Ambulatory Visit (INDEPENDENT_AMBULATORY_CARE_PROVIDER_SITE_OTHER): Payer: Medicare Other | Admitting: Pharmacist

## 2017-12-04 DIAGNOSIS — Z7901 Long term (current) use of anticoagulants: Secondary | ICD-10-CM | POA: Diagnosis not present

## 2017-12-04 DIAGNOSIS — N186 End stage renal disease: Secondary | ICD-10-CM | POA: Diagnosis not present

## 2017-12-04 DIAGNOSIS — I481 Persistent atrial fibrillation: Secondary | ICD-10-CM

## 2017-12-04 DIAGNOSIS — N2581 Secondary hyperparathyroidism of renal origin: Secondary | ICD-10-CM | POA: Diagnosis not present

## 2017-12-04 DIAGNOSIS — D631 Anemia in chronic kidney disease: Secondary | ICD-10-CM | POA: Diagnosis not present

## 2017-12-04 DIAGNOSIS — I4819 Other persistent atrial fibrillation: Secondary | ICD-10-CM

## 2017-12-04 DIAGNOSIS — Z952 Presence of prosthetic heart valve: Secondary | ICD-10-CM

## 2017-12-04 LAB — POCT INR: INR: 2 (ref 2.0–3.0)

## 2017-12-04 NOTE — Progress Notes (Signed)
I have reviewed the above note and agree.  Kaoir Loree, M.D.  

## 2017-12-05 DIAGNOSIS — N186 End stage renal disease: Secondary | ICD-10-CM | POA: Diagnosis not present

## 2017-12-05 DIAGNOSIS — Z992 Dependence on renal dialysis: Secondary | ICD-10-CM | POA: Diagnosis not present

## 2017-12-05 DIAGNOSIS — T8610 Unspecified complication of kidney transplant: Secondary | ICD-10-CM | POA: Diagnosis not present

## 2017-12-07 DIAGNOSIS — D631 Anemia in chronic kidney disease: Secondary | ICD-10-CM | POA: Diagnosis not present

## 2017-12-07 DIAGNOSIS — N186 End stage renal disease: Secondary | ICD-10-CM | POA: Diagnosis not present

## 2017-12-07 DIAGNOSIS — Z23 Encounter for immunization: Secondary | ICD-10-CM | POA: Diagnosis not present

## 2017-12-07 DIAGNOSIS — N2581 Secondary hyperparathyroidism of renal origin: Secondary | ICD-10-CM | POA: Diagnosis not present

## 2017-12-08 ENCOUNTER — Ambulatory Visit (INDEPENDENT_AMBULATORY_CARE_PROVIDER_SITE_OTHER): Payer: Medicare Other | Admitting: Pharmacist

## 2017-12-08 DIAGNOSIS — I481 Persistent atrial fibrillation: Secondary | ICD-10-CM | POA: Diagnosis not present

## 2017-12-08 DIAGNOSIS — Z7901 Long term (current) use of anticoagulants: Secondary | ICD-10-CM | POA: Diagnosis not present

## 2017-12-08 DIAGNOSIS — I482 Chronic atrial fibrillation: Secondary | ICD-10-CM | POA: Diagnosis not present

## 2017-12-08 DIAGNOSIS — Z952 Presence of prosthetic heart valve: Secondary | ICD-10-CM

## 2017-12-08 DIAGNOSIS — I4819 Other persistent atrial fibrillation: Secondary | ICD-10-CM

## 2017-12-08 LAB — POCT INR: INR: 1.8 — AB (ref 2.0–3.0)

## 2017-12-08 NOTE — Progress Notes (Signed)
I have reviewed the above note and agree.  Eric Sonnenberg, M.D.  

## 2017-12-09 ENCOUNTER — Telehealth: Payer: Self-pay | Admitting: Family Medicine

## 2017-12-09 DIAGNOSIS — D631 Anemia in chronic kidney disease: Secondary | ICD-10-CM | POA: Diagnosis not present

## 2017-12-09 DIAGNOSIS — N186 End stage renal disease: Secondary | ICD-10-CM | POA: Diagnosis not present

## 2017-12-09 DIAGNOSIS — N2581 Secondary hyperparathyroidism of renal origin: Secondary | ICD-10-CM | POA: Diagnosis not present

## 2017-12-09 DIAGNOSIS — Z23 Encounter for immunization: Secondary | ICD-10-CM | POA: Diagnosis not present

## 2017-12-09 NOTE — Telephone Encounter (Signed)
Jeremy [name], from [facility] calling to report yesterday's INR 1.8, which was out of range. Nira Conn can be contacted at [phone]

## 2017-12-09 NOTE — Telephone Encounter (Signed)
This was noted yesterday by our clinical pharmacist who spoke with the patient and directed him on his Coumadin. Please let [facility] know. Thanks.

## 2017-12-09 NOTE — Telephone Encounter (Signed)
FYI

## 2017-12-10 ENCOUNTER — Ambulatory Visit: Payer: Medicare Other

## 2017-12-11 ENCOUNTER — Telehealth: Payer: Self-pay | Admitting: Pharmacist

## 2017-12-11 DIAGNOSIS — D631 Anemia in chronic kidney disease: Secondary | ICD-10-CM | POA: Diagnosis not present

## 2017-12-11 DIAGNOSIS — Z23 Encounter for immunization: Secondary | ICD-10-CM | POA: Diagnosis not present

## 2017-12-11 DIAGNOSIS — N2581 Secondary hyperparathyroidism of renal origin: Secondary | ICD-10-CM | POA: Diagnosis not present

## 2017-12-11 DIAGNOSIS — N186 End stage renal disease: Secondary | ICD-10-CM | POA: Diagnosis not present

## 2017-12-11 NOTE — Telephone Encounter (Signed)
Per last INR check, contacted patient this morning to check INR result and adjust warfarin dose as needed d/t recent subtherapeutic readings.   Successful contact with patient this morning. He stated that he had not checked his INR, and would call me back when he did.   Contacted again this afternoon, left HIPAA compliant message to return my call.   Plan - If I have not heard back from patient by end of business today, will f/u on INR on Monday as per usual pattern.   Catie Darnelle Maffucci, PharmD PGY2 Ambulatory Care Pharmacy Resident Phone: [phone]

## 2017-12-14 ENCOUNTER — Encounter (INDEPENDENT_AMBULATORY_CARE_PROVIDER_SITE_OTHER): Payer: Self-pay | Admitting: Vascular Surgery

## 2017-12-14 ENCOUNTER — Ambulatory Visit (INDEPENDENT_AMBULATORY_CARE_PROVIDER_SITE_OTHER): Payer: Medicare Other | Admitting: Nurse Practitioner

## 2017-12-14 VITALS — BP 108/62 | HR 64 | Resp 14 | Ht 70.0 in | Wt 188.0 lb

## 2017-12-14 DIAGNOSIS — I770 Arteriovenous fistula, acquired: Secondary | ICD-10-CM | POA: Diagnosis not present

## 2017-12-14 DIAGNOSIS — Z992 Dependence on renal dialysis: Secondary | ICD-10-CM | POA: Diagnosis not present

## 2017-12-14 DIAGNOSIS — Z23 Encounter for immunization: Secondary | ICD-10-CM | POA: Diagnosis not present

## 2017-12-14 DIAGNOSIS — N186 End stage renal disease: Secondary | ICD-10-CM | POA: Diagnosis not present

## 2017-12-14 DIAGNOSIS — I10 Essential (primary) hypertension: Secondary | ICD-10-CM

## 2017-12-14 DIAGNOSIS — I481 Persistent atrial fibrillation: Secondary | ICD-10-CM

## 2017-12-14 DIAGNOSIS — I4819 Other persistent atrial fibrillation: Secondary | ICD-10-CM

## 2017-12-14 DIAGNOSIS — D631 Anemia in chronic kidney disease: Secondary | ICD-10-CM | POA: Diagnosis not present

## 2017-12-14 DIAGNOSIS — N2581 Secondary hyperparathyroidism of renal origin: Secondary | ICD-10-CM | POA: Diagnosis not present

## 2017-12-14 LAB — POCT INR
INR: 2.6 (ref 2.0–3.0)
INR: 2.6 — AB (ref <=1.1)

## 2017-12-15 ENCOUNTER — Encounter (INDEPENDENT_AMBULATORY_CARE_PROVIDER_SITE_OTHER): Payer: Self-pay | Admitting: Nurse Practitioner

## 2017-12-15 ENCOUNTER — Ambulatory Visit
Admission: RE | Admit: 2017-12-15 | Discharge: 2017-12-15 | Disposition: A | Discharge status: Home / Self Care | Payer: Medicare Other | Source: Ambulatory Visit | Attending: Internal Medicine | Admitting: Internal Medicine

## 2017-12-15 ENCOUNTER — Ambulatory Visit (INDEPENDENT_AMBULATORY_CARE_PROVIDER_SITE_OTHER): Payer: Medicare Other | Admitting: Pharmacist

## 2017-12-15 DIAGNOSIS — Z952 Presence of prosthetic heart valve: Secondary | ICD-10-CM

## 2017-12-15 DIAGNOSIS — I481 Persistent atrial fibrillation: Secondary | ICD-10-CM

## 2017-12-15 DIAGNOSIS — K769 Liver disease, unspecified: Secondary | ICD-10-CM | POA: Diagnosis not present

## 2017-12-15 DIAGNOSIS — Z7901 Long term (current) use of anticoagulants: Secondary | ICD-10-CM

## 2017-12-15 DIAGNOSIS — I2510 Atherosclerotic heart disease of native coronary artery without angina pectoris: Secondary | ICD-10-CM | POA: Insufficient documentation

## 2017-12-15 DIAGNOSIS — I517 Cardiomegaly: Secondary | ICD-10-CM | POA: Diagnosis not present

## 2017-12-15 DIAGNOSIS — I700 Atherosclerosis of aorta: Secondary | ICD-10-CM | POA: Diagnosis not present

## 2017-12-15 DIAGNOSIS — R918 Other nonspecific abnormal finding of lung field: Secondary | ICD-10-CM | POA: Diagnosis not present

## 2017-12-15 DIAGNOSIS — K8020 Calculus of gallbladder without cholecystitis without obstruction: Secondary | ICD-10-CM | POA: Diagnosis not present

## 2017-12-15 DIAGNOSIS — N250 Renal osteodystrophy: Secondary | ICD-10-CM | POA: Diagnosis not present

## 2017-12-15 DIAGNOSIS — K449 Diaphragmatic hernia without obstruction or gangrene: Secondary | ICD-10-CM | POA: Insufficient documentation

## 2017-12-15 DIAGNOSIS — I4819 Other persistent atrial fibrillation: Secondary | ICD-10-CM

## 2017-12-15 DIAGNOSIS — J849 Interstitial pulmonary disease, unspecified: Secondary | ICD-10-CM | POA: Diagnosis not present

## 2017-12-15 DIAGNOSIS — J189 Pneumonia, unspecified organism: Secondary | ICD-10-CM | POA: Diagnosis not present

## 2017-12-15 NOTE — Progress Notes (Signed)
Subjective:    Patient ID: Jeremy [name], male    DOB: 06-07-1955, 62 y.o.   MRN: [id number] Chief Complaint  Patient presents with  . Follow-up    Check open wound    HPI  Jeremy [name] is a 62 y.o. male who presents for follow-up after peritoneal dialysis catheter removal.  The patient is currently undergoing hemodialysis using his AV fistula, which is working well.  Patient reports no pain at peritoneal dialysis catheter removal sites.  Patient does not report any bleeding or open wounds.  Patient denies any fever, chills, nausea, vomiting, diarrhea.  Patient denies abdominal pain.  Patient denies amaurosis fugax or TIA-like symptoms.   Constitutional: [] Weight loss  [] Fever  [] Chills Cardiac: [] Chest pain   [] Chest pressure   [] Palpitations   [] Shortness of breath when laying flat   [] Shortness of breath with exertion. Vascular:  [] Pain in legs with walking   [] Pain in legs with standing  [] History of DVT   [] Phlebitis   [] Swelling in legs   [] Varicose veins   [] Non-healing ulcers Pulmonary:   [] Uses home oxygen   [] Productive cough   [] Hemoptysis   [] Wheeze  [] COPD   [] Asthma Neurologic:  [] Dizziness   [] Seizures   [] History of stroke   [] History of TIA  [] Aphasia   [] Vissual changes   [] Weakness or numbness in arm   [] Weakness or numbness in leg Musculoskeletal:   [] Joint swelling   [] Joint pain   [] Low back pain Hematologic:  [] Easy bruising  [] Easy bleeding   [] Hypercoagulable state   [] Anemic Gastrointestinal:  [] Diarrhea   [] Vomiting  [] Gastroesophageal reflux/heartburn   [] Difficulty swallowing. Genitourinary:  [x] Chronic kidney disease   [] Difficult urination  [] Frequent urination   [] Blood in urine Skin:  [] Rashes   [] Ulcers  Psychological:  [] History of anxiety   []  History of major depression.     Objective:   Physical Exam  BP 108/62 (BP Location: Right Arm, Patient Position: Sitting)   Pulse 64   Resp 14   Ht 5\' 10"  (1.778 m)   Wt 188 lb (85.3 kg)   BMI 26.98 kg/m    Past Medical History:  Diagnosis Date  . 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 Arthritis   . Chronic anticoagulation 06/2012  . Dialysis patient Jeremy [name]) 01 01 2008  . Dysrhythmia    A Fib  . Hx of colonoscopy    2009 by Dr. Laural Golden. Normal except for small submucosal lipoma. No evidence of polyps or colitis.  . Hyperlipidemia   . Hypertension   . Hypogonadism in male 07/25/2015  . Kidney disease    Chronic kidney disease secondary to IgA nephropathy diagnosed when he was 54. 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  Marland Kitchen OSA (obstructive sleep apnea)    No CPAP  . Renal cell carcinoma ([place]) 08/04/2016     Gen: WD/WN, NAD Head: [place]/AT, No temporalis wasting.  Ear/Nose/Throat: Hearing grossly intact, nares w/o erythema or drainage Eyes: PER, EOMI, sclera nonicteric.  Neck: Supple, no masses.  No JVD.  Pulmonary:  Good air movement, no use of accessory muscles.  Cardiac: irregularly irregular Vascular:  Vessel Right Left  Radial  2+ 2 plus  Gastrointestinal: soft, non-distended. No guarding/no peritoneal signs.  3 small laparoscopic puncture wounds.  All areas well approximated.  Symptoms tiny scab formation, however not erythematous. Musculoskeletal: M/S 5/5 throughout.  No deformity or atrophy.  Neurologic: Pain  and light touch intact in extremities.  Symmetrical.  Speech is fluent. Motor exam as listed above. Psychiatric: Judgment intact, Mood & affect appropriate for pt's clinical situation. Dermatologic: No Venous rashes. No Ulcers Noted.  No changes consistent with cellulitis. Lymph : No Cervical lymphadenopathy, no lichenification or skin changes of chronic lymphedema.   Social History   Socioeconomic History  . Marital status: Married    Spouse name: Not on file  . Number of children: Not on file  . Years of education: Not on file  . Highest education level: Not on file  Occupational  History  . Occupation: courier    Comment: [place]  . Financial resource strain: Not on file  . Food insecurity:    Worry: Not on file    Inability: Not on file  . Transportation needs:    Medical: Not on file    Non-medical: Not on file  Tobacco Use  . Smoking status: Never Smoker  . Smokeless tobacco: Never Used  Substance and Sexual Activity  . Alcohol use: No  . Drug use: No  . Sexual activity: Not Currently    Partners: Female  Lifestyle  . Physical activity:    Days per week: Not on file    Minutes per session: Not on file  . Stress: Not on file  Relationships  . Social connections:    Talks on phone: Not on file    Gets together: Not on file    Attends religious service: Not on file    Active member of club or organization: Not on file    Attends meetings of clubs or organizations: Not on file    Relationship status: Not on file  . Intimate partner violence:    Fear of current or ex partner: Not on file    Emotionally abused: Not on file    Physically abused: Not on file    Forced sexual activity: Not on file  Other Topics Concern  . Not on file  Social History Narrative   Single   1 daughter 50     Past Surgical History:  Procedure Laterality Date  . AORTIC VALVE REPLACEMENT  3 /11 /2014   At [place]    . CAPD INSERTION N/A 02/19/2017   Procedure: LAPAROSCOPIC INSERTION CONTINUOUS AMBULATORY PERITONEAL DIALYSIS  (CAPD) CATHETER;  Surgeon: Algernon Huxley, MD;  Location: [facility] ORS;  Service: General;  Laterality: N/A;  . CORONARY ANGIOPLASTY WITH STENT PLACEMENT    . DG AV DIALYSIS  SHUNT ACCESS EXIST*L* OR     left arm  . EPIGASTRIC HERNIA REPAIR N/A 02/19/2017   Procedure: HERNIA REPAIR EPIGASTRIC ADULT;  Surgeon: Robert Bellow, MD;  Location: [facility] ORS;  Service: General;  Laterality: N/A;  . EYE SURGERY     bilateral cataract  . HERNIA REPAIR     UMBILICAL HERNIA  . HERNIA  REPAIR  02/19/2017  . INNER EAR SURGERY     20 years ago  . KIDNEY TRANSPLANT    . Peritoneal Dialysis access placement  02/19/2017  . REMOVAL OF A DIALYSIS CATHETER N/A 11/23/2017   Procedure: REMOVAL OF A PERITONEAL DIALYSIS CATHETER;  Surgeon: Algernon Huxley, MD;  Location: [facility] ORS;  Service: Vascular;  Laterality: N/A;  . TEE WITHOUT CARDIOVERSION N/A 07/21/2016   Procedure: TRANSESOPHAGEAL ECHOCARDIOGRAM (TEE);  Surgeon: Wellington Hampshire, MD;  Location: [facility] ORS;  Service: Cardiovascular;  Laterality: N/A;  Family History  Problem Relation Age of Onset  . Cancer Mother        pancreatic  . Heart disease Father   . Diabetes Father     Allergies  Allergen Reactions  . Statins     Other reaction(s): Arthralgias (intolerance), Myalgias (intolerance)  . Sertraline Rash       Assessment & Plan:   1. ESRD on hemodialysis [facility]) The patient recently underwent removal of his peritoneal dialysis catheter.  The patient is currently utilizing a left radiocephalic AV fistula for hemodialysis access.  He reports this access is going well.  The peritoneal dialysis catheter removal sites appear to be healing well.  2. AV fistula ([place]) Patient will follow-up for HDA of his left radiocephalic access.  3. Persistent atrial fibrillation (HCC) Continue antiarrhythmia medications as already ordered, these medications have been reviewed and there are no changes at this time.  Continue anticoagulation as ordered by Cardiology Service   4. Essential hypertension Continue antihypertensive medications as already ordered, these medications have been reviewed and there are no changes at this time.    Current Outpatient Medications on File Prior to Visit  Medication Sig Dispense Refill  . acetaminophen (TYLENOL) 500 MG tablet Take 1,000 mg 2 (two) times daily as needed by mouth for moderate pain.    Marland Kitchen allopurinol (ZYLOPRIM) 100 MG tablet Take 2 tablets (200 mg total) by mouth daily. 180 tablet  1  . cinacalcet (SENSIPAR) 60 MG tablet Take 30 mg by mouth daily. At dialysis    . Dextrose-Fructose-Sod Citrate (NAUZENE PO) Take 1-2 tablets 2 (two) times daily as needed by mouth (nausea).    . dicyclomine (BENTYL) 10 MG capsule Take 2 capsules (20 mg total) by mouth 3 (three) times daily before meals. 90 capsule 4  . DiphenhydrAMINE HCl, Sleep, 50 MG CAPS Take 50 mg at bedtime as needed by mouth (sleep).    Marland Kitchen FLUoxetine (PROZAC) 10 MG capsule TAKE 1 CAPSULE BY MOUTH EVERY DAY 90 capsule 0  . HYDROcodone-acetaminophen (NORCO) 5-325 MG tablet Take 1 tablet by mouth every 6 (six) hours as needed for moderate pain. 30 tablet 0  . lidocaine-prilocaine (EMLA) cream Apply 1 application as needed topically (port access).    . metoprolol tartrate (LOPRESSOR) 100 MG tablet Take 0.5 tablets (50 mg total) by mouth 2 (two) times daily. 90 tablet 3  . Multiple Vitamin (MULTIVITAMIN WITH MINERALS) TABS tablet Take 1 tablet by mouth daily.    . multivitamin (RENA-VIT) TABS tablet Take 1 tablet by mouth daily.  4  . nitroGLYCERIN (NITROSTAT) 0.4 MG SL tablet Place 1 tablet (0.4 mg total) under the tongue every 5 (five) minutes as needed for chest pain. 25 tablet 2  . Omega-3 Fatty Acids (FISH OIL) 1200 MG CAPS Take 1,200 mg 2 (two) times daily by mouth.    Marland Kitchen omeprazole (PRILOSEC) 20 MG capsule Take 1 capsule (20 mg total) by mouth daily. 90 capsule 1  . pravastatin (PRAVACHOL) 10 MG tablet TAKE ONE TABLET BY MOUTH ON MONDAY, WEDNESDAY, AND FRIDAY 36 tablet 0  . predniSONE (DELTASONE) 10 MG tablet Take 2 tabs (20mg ) daily through 7/15, then 1 & 1/2 tabs (15mg ) daily through 7/29, then 1 tab (10mg ) daily through 8/12, then 1/2 tab (5mg ) daily through 8/26, then stop    . warfarin (COUMADIN) 1 MG tablet Take 1 tablet (1 mg total) by mouth daily. 180 tablet 1   No current facility-administered medications on file prior to visit.  There are no Patient Instructions on file for this visit. No follow-ups on  file.   Kris Hartmann, NP

## 2017-12-16 DIAGNOSIS — N186 End stage renal disease: Secondary | ICD-10-CM | POA: Diagnosis not present

## 2017-12-16 DIAGNOSIS — N2581 Secondary hyperparathyroidism of renal origin: Secondary | ICD-10-CM | POA: Diagnosis not present

## 2017-12-16 DIAGNOSIS — D631 Anemia in chronic kidney disease: Secondary | ICD-10-CM | POA: Diagnosis not present

## 2017-12-16 DIAGNOSIS — Z23 Encounter for immunization: Secondary | ICD-10-CM | POA: Diagnosis not present

## 2017-12-18 DIAGNOSIS — N186 End stage renal disease: Secondary | ICD-10-CM | POA: Diagnosis not present

## 2017-12-18 DIAGNOSIS — D631 Anemia in chronic kidney disease: Secondary | ICD-10-CM | POA: Diagnosis not present

## 2017-12-18 DIAGNOSIS — N2581 Secondary hyperparathyroidism of renal origin: Secondary | ICD-10-CM | POA: Diagnosis not present

## 2017-12-18 DIAGNOSIS — Z23 Encounter for immunization: Secondary | ICD-10-CM | POA: Diagnosis not present

## 2017-12-21 ENCOUNTER — Ambulatory Visit (INDEPENDENT_AMBULATORY_CARE_PROVIDER_SITE_OTHER): Payer: Medicare Other | Admitting: Pharmacist

## 2017-12-21 DIAGNOSIS — H43813 Vitreous degeneration, bilateral: Secondary | ICD-10-CM | POA: Diagnosis not present

## 2017-12-21 DIAGNOSIS — I4819 Other persistent atrial fibrillation: Secondary | ICD-10-CM

## 2017-12-21 DIAGNOSIS — Z952 Presence of prosthetic heart valve: Secondary | ICD-10-CM

## 2017-12-21 DIAGNOSIS — D631 Anemia in chronic kidney disease: Secondary | ICD-10-CM | POA: Diagnosis not present

## 2017-12-21 DIAGNOSIS — N2581 Secondary hyperparathyroidism of renal origin: Secondary | ICD-10-CM | POA: Diagnosis not present

## 2017-12-21 DIAGNOSIS — N186 End stage renal disease: Secondary | ICD-10-CM | POA: Diagnosis not present

## 2017-12-21 DIAGNOSIS — H26493 Other secondary cataract, bilateral: Secondary | ICD-10-CM | POA: Diagnosis not present

## 2017-12-21 DIAGNOSIS — Z7901 Long term (current) use of anticoagulants: Secondary | ICD-10-CM

## 2017-12-21 DIAGNOSIS — I481 Persistent atrial fibrillation: Secondary | ICD-10-CM | POA: Diagnosis not present

## 2017-12-21 DIAGNOSIS — Z961 Presence of intraocular lens: Secondary | ICD-10-CM | POA: Diagnosis not present

## 2017-12-21 DIAGNOSIS — Z23 Encounter for immunization: Secondary | ICD-10-CM | POA: Diagnosis not present

## 2017-12-21 LAB — POCT INR: INR: 1.8 — AB (ref 2.0–3.0)

## 2017-12-22 ENCOUNTER — Telehealth: Payer: Self-pay

## 2017-12-22 NOTE — Telephone Encounter (Signed)
Copied from [place] [id number]. Topic: General - Other >> Dec 22, 2017  8:53 AM Carolyn Stare wrote:  [facility] with [facility] call with a out of range INR    12/21/17 1.8

## 2017-12-22 NOTE — Telephone Encounter (Signed)
Reviewed by pharmacist yesterday. Please see her documentation.

## 2017-12-22 NOTE — Telephone Encounter (Signed)
Sent to PCP as an FYI please advise INR 1.8

## 2017-12-23 DIAGNOSIS — N2581 Secondary hyperparathyroidism of renal origin: Secondary | ICD-10-CM | POA: Diagnosis not present

## 2017-12-23 DIAGNOSIS — M150 Primary generalized (osteo)arthritis: Secondary | ICD-10-CM | POA: Diagnosis not present

## 2017-12-23 DIAGNOSIS — M2550 Pain in unspecified joint: Secondary | ICD-10-CM | POA: Diagnosis not present

## 2017-12-23 DIAGNOSIS — N186 End stage renal disease: Secondary | ICD-10-CM | POA: Diagnosis not present

## 2017-12-23 DIAGNOSIS — E790 Hyperuricemia without signs of inflammatory arthritis and tophaceous disease: Secondary | ICD-10-CM | POA: Diagnosis not present

## 2017-12-23 DIAGNOSIS — M8580 Other specified disorders of bone density and structure, unspecified site: Secondary | ICD-10-CM | POA: Diagnosis not present

## 2017-12-23 DIAGNOSIS — D631 Anemia in chronic kidney disease: Secondary | ICD-10-CM | POA: Diagnosis not present

## 2017-12-23 DIAGNOSIS — E663 Overweight: Secondary | ICD-10-CM | POA: Diagnosis not present

## 2017-12-23 DIAGNOSIS — Z6827 Body mass index (BMI) 27.0-27.9, adult: Secondary | ICD-10-CM | POA: Diagnosis not present

## 2017-12-23 DIAGNOSIS — M109 Gout, unspecified: Secondary | ICD-10-CM | POA: Diagnosis not present

## 2017-12-23 DIAGNOSIS — Z23 Encounter for immunization: Secondary | ICD-10-CM | POA: Diagnosis not present

## 2017-12-24 ENCOUNTER — Ambulatory Visit
Admission: RE | Admit: 2017-12-24 | Discharge: 2017-12-24 | Disposition: A | Discharge status: Home / Self Care | Payer: Medicare Other | Source: Ambulatory Visit | Attending: Physician Assistant | Admitting: Physician Assistant

## 2017-12-24 DIAGNOSIS — M8580 Other specified disorders of bone density and structure, unspecified site: Secondary | ICD-10-CM

## 2017-12-24 DIAGNOSIS — M810 Age-related osteoporosis without current pathological fracture: Secondary | ICD-10-CM | POA: Insufficient documentation

## 2017-12-24 DIAGNOSIS — Z7952 Long term (current) use of systemic steroids: Secondary | ICD-10-CM | POA: Insufficient documentation

## 2017-12-24 DIAGNOSIS — Z13820 Encounter for screening for osteoporosis: Secondary | ICD-10-CM | POA: Insufficient documentation

## 2017-12-24 DIAGNOSIS — M85832 Other specified disorders of bone density and structure, left forearm: Secondary | ICD-10-CM | POA: Insufficient documentation

## 2017-12-24 DIAGNOSIS — M818 Other osteoporosis without current pathological fracture: Secondary | ICD-10-CM | POA: Diagnosis not present

## 2017-12-24 NOTE — Progress Notes (Signed)
I have reviewed the above note and agree. I was available to the pharmacist for consultation.  Tommi Rumps, MD

## 2017-12-25 ENCOUNTER — Encounter: Payer: Self-pay | Admitting: Family Medicine

## 2017-12-25 ENCOUNTER — Encounter (INDEPENDENT_AMBULATORY_CARE_PROVIDER_SITE_OTHER): Payer: Medicare Other

## 2017-12-25 ENCOUNTER — Ambulatory Visit (INDEPENDENT_AMBULATORY_CARE_PROVIDER_SITE_OTHER): Payer: Medicare Other | Admitting: Vascular Surgery

## 2017-12-25 DIAGNOSIS — Z23 Encounter for immunization: Secondary | ICD-10-CM | POA: Diagnosis not present

## 2017-12-25 DIAGNOSIS — D631 Anemia in chronic kidney disease: Secondary | ICD-10-CM | POA: Diagnosis not present

## 2017-12-25 DIAGNOSIS — N186 End stage renal disease: Secondary | ICD-10-CM | POA: Diagnosis not present

## 2017-12-25 DIAGNOSIS — N2581 Secondary hyperparathyroidism of renal origin: Secondary | ICD-10-CM | POA: Diagnosis not present

## 2017-12-28 ENCOUNTER — Ambulatory Visit (INDEPENDENT_AMBULATORY_CARE_PROVIDER_SITE_OTHER): Payer: Medicare Other | Admitting: Pharmacist

## 2017-12-28 DIAGNOSIS — Z952 Presence of prosthetic heart valve: Secondary | ICD-10-CM

## 2017-12-28 DIAGNOSIS — N186 End stage renal disease: Secondary | ICD-10-CM | POA: Diagnosis not present

## 2017-12-28 DIAGNOSIS — D631 Anemia in chronic kidney disease: Secondary | ICD-10-CM | POA: Diagnosis not present

## 2017-12-28 DIAGNOSIS — Z23 Encounter for immunization: Secondary | ICD-10-CM | POA: Diagnosis not present

## 2017-12-28 DIAGNOSIS — I4819 Other persistent atrial fibrillation: Secondary | ICD-10-CM

## 2017-12-28 DIAGNOSIS — I481 Persistent atrial fibrillation: Secondary | ICD-10-CM

## 2017-12-28 DIAGNOSIS — Z7901 Long term (current) use of anticoagulants: Secondary | ICD-10-CM | POA: Diagnosis not present

## 2017-12-28 DIAGNOSIS — N2581 Secondary hyperparathyroidism of renal origin: Secondary | ICD-10-CM | POA: Diagnosis not present

## 2017-12-28 LAB — POCT INR: INR: 2.5 (ref 2.0–3.0)

## 2017-12-28 NOTE — Progress Notes (Signed)
I have reviewed the above note and agree. I was available to the pharmacist for consultation.  Tommi Rumps, MD

## 2017-12-29 ENCOUNTER — Ambulatory Visit (INDEPENDENT_AMBULATORY_CARE_PROVIDER_SITE_OTHER): Payer: Medicare Other

## 2017-12-29 ENCOUNTER — Other Ambulatory Visit (INDEPENDENT_AMBULATORY_CARE_PROVIDER_SITE_OTHER): Payer: Self-pay | Admitting: Nurse Practitioner

## 2017-12-29 ENCOUNTER — Encounter (INDEPENDENT_AMBULATORY_CARE_PROVIDER_SITE_OTHER): Payer: Self-pay | Admitting: Nurse Practitioner

## 2017-12-29 ENCOUNTER — Ambulatory Visit (INDEPENDENT_AMBULATORY_CARE_PROVIDER_SITE_OTHER): Payer: Medicare Other | Admitting: Nurse Practitioner

## 2017-12-29 VITALS — BP 131/74 | HR 69 | Resp 16 | Ht 70.0 in | Wt 189.0 lb

## 2017-12-29 DIAGNOSIS — I120 Hypertensive chronic kidney disease with stage 5 chronic kidney disease or end stage renal disease: Secondary | ICD-10-CM

## 2017-12-29 DIAGNOSIS — N186 End stage renal disease: Secondary | ICD-10-CM | POA: Diagnosis not present

## 2017-12-29 DIAGNOSIS — Z992 Dependence on renal dialysis: Principal | ICD-10-CM

## 2017-12-29 DIAGNOSIS — E785 Hyperlipidemia, unspecified: Secondary | ICD-10-CM

## 2017-12-29 DIAGNOSIS — I10 Essential (primary) hypertension: Secondary | ICD-10-CM

## 2017-12-29 NOTE — Progress Notes (Signed)
Subjective:    Patient ID: Jeremy [name], male    DOB: 06-Sep-1955, 62 y.o.   MRN: [id number] Chief Complaint  Patient presents with  . Follow-up    HDA f/u    HPI  Jeremy [name] is a 62 y.o. male that returns to the office for followup of their dialysis access. The function of the access has been stable. The patient denies increased bleeding time or increased recirculation. Patient denies difficulty with cannulation. The patient denies hand pain or other symptoms consistent with steal phenomena.  No significant arm swelling.  The patient denies redness or swelling at the access site. The patient denies fever or chills at home or while on dialysis.  Patient states he has a hold time of about 5 to 10 minutes following dialysis.  The patient denies amaurosis fugax or recent TIA symptoms. There are no recent neurological changes noted. The patient denies claudication symptoms or rest pain symptoms. The patient denies history of DVT, PE or superficial thrombophlebitis. The patient denies recent episodes of angina or shortness of breath.   Jeremy [name] underwent and AV fistula duplex today which revealed a flow volume of 1290.  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  There are velocities of less than 100 cm/s noted throughout the dilated outflow vein otherwise there is a patent left radiocephalic AV fistula with no significant stenosis.     Review of Systems: Negative Unless Checked Constitutional: [] Weight loss  [] Fever  [] Chills Cardiac: [] Chest pain   [] Chest pressure   [] Palpitations   [] Shortness of breath when laying flat   [] Shortness of breath with exertion. Vascular:  [] Pain in legs with walking   [] Pain in legs with standing  [] History of DVT   [] Phlebitis   [] Swelling in legs   [] Varicose veins   [] Non-healing ulcers Pulmonary:   [] Uses home oxygen   [] Productive cough   [] Hemoptysis   [] Wheeze  [] COPD   [] Asthma Neurologic:   [] Dizziness   [] Seizures   [] History of stroke   [] History of TIA  [] Aphasia   [] Vissual changes   [] Weakness or numbness in arm   [] Weakness or numbness in leg Musculoskeletal:   [] Joint swelling   [] Joint pain   [] Low back pain Hematologic:  [] Easy bruising  [] Easy bleeding   [] Hypercoagulable state   [] Anemic Gastrointestinal:  [] Diarrhea   [] Vomiting  [] Gastroesophageal reflux/heartburn   [] Difficulty swallowing. Genitourinary:  [] Chronic kidney disease   [] Difficult urination  [] Frequent urination   [] Blood in urine Skin:  [] Rashes   [] Ulcers  Psychological:  [] History of anxiety   []  History of major depression.     Objective:   Physical Exam  BP 131/74 (BP Location: Right Arm, Patient Position: Sitting)   Pulse 69   Resp 16   Ht 5\' 10"  (1.778 m)   Wt 189 lb (85.7 kg)   BMI 27.12 kg/m   Past Medical History:  Diagnosis Date  . 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 Arthritis   . Chronic anticoagulation 06/2012  . Dialysis patient [facility]) 01 01 2008  . Dysrhythmia    A Fib  . Hx of colonoscopy    2009 by Dr. Laural Golden. Normal except for small submucosal lipoma. No evidence of polyps or colitis.  . Hyperlipidemia   . Hypertension   . Hypogonadism in male 07/25/2015  . 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  Marland Kitchen OSA (obstructive sleep apnea)    No CPAP  . Renal cell carcinoma ([place]) 08/04/2016     Gen: WD/WN, NAD Head: [place]/AT, No temporalis wasting.  Ear/Nose/Throat: Hearing grossly intact, nares w/o erythema or drainage Eyes: PER, EOMI, sclera nonicteric.  Neck: Supple, no masses.  No JVD.  Pulmonary:  Good air movement, no use of accessory muscles.  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  Good thrill felt through the distal and mid fistula, faint near the  proximal portion.  Bruit auscultated.  Radial:  (R)[] Palpable  ?Non-Palpable   ?Trace             (L)[] Palpable  [] Non-Palpable   ?trace Brachial: (R)[] Palpable  [] Non-Palpable   [] Trace               (L)[] Palpable  [] Non-Palpable   ?trace Femoral:(R)[] Palpable  [] Non-Palpable   [] Trace              (L)[] Palpable  [] Non-Palpable   ?trace Popliteal:(R)[] Palpable  [] Non-Palpable   [] Trace               (L)[] Palpable  [] Non-Palpable   ?trace Posterior Tibial:(R)[] Palpable  [] Non-Palpable   [] Trace                         (L)[] Palpable  [] Non-Palpable   [] Trace Dorsalis Pedis: (R)[] Palpable  [] Non-Palpable   [] Trace                         (L)[] Palpable  [] Non-Palpable   [] Trace Gastrointestinal: soft, non-distended. No guarding/no peritoneal signs.  Musculoskeletal: M/S 5/5 throughout.  No deformity or atrophy.  Neurologic: Pain and light touch intact in extremities.  Symmetrical.  Speech is fluent. Motor exam as listed above. Psychiatric: Judgment intact, Mood & affect appropriate for pt's clinical situation. Dermatologic: No Venous rashes. No Ulcers Noted.  No changes consistent with cellulitis. Lymph : No Cervical lymphadenopathy, no lichenification or skin changes of chronic lymphedema.   Social History   Socioeconomic History  . Marital status: Married    Spouse name: Not on file  . Number of children: Not on file  . Years of education: Not on file  . Highest education level: Not on file  Occupational History  . Occupation: courier    Comment: [employer]  . Financial resource strain: Not on file  . Food insecurity:    Worry: Not on file    Inability: Not on file  . Transportation needs:    Medical: Not on file    Non-medical: Not on file  Tobacco Use  . Smoking status: Never Smoker  . Smokeless tobacco: Never Used  Substance and Sexual Activity  . Alcohol use: No  . Drug use: No  . Sexual activity: Not Currently    Partners: Female  Lifestyle  .  Physical activity:    Days per week: Not on file    Minutes per session: Not on file  . Stress: Not on file  Relationships  . Social connections:    Talks on phone: Not on file    Gets together: Not on file    Attends religious service: Not on file    Active member of club or organization: Not on file    Attends meetings of clubs or  organizations: Not on file    Relationship status: Not on file  . Intimate partner violence:    Fear of current or ex partner: Not on file    Emotionally abused: Not on file    Physically abused: Not on file    Forced sexual activity: Not on file  Other Topics Concern  . Not on file  Social History Narrative   Single   1 daughter 60     Past Surgical History:  Procedure Laterality Date  . AORTIC VALVE REPLACEMENT  3 /11 /2014   At [place]    . CAPD INSERTION N/A 02/19/2017   Procedure: LAPAROSCOPIC INSERTION CONTINUOUS AMBULATORY PERITONEAL DIALYSIS  (CAPD) CATHETER;  Surgeon: Algernon Huxley, MD;  Location: [facility] ORS;  Service: General;  Laterality: N/A;  . CORONARY ANGIOPLASTY WITH STENT PLACEMENT    . DG AV DIALYSIS  SHUNT ACCESS EXIST*L* OR     left arm  . EPIGASTRIC HERNIA REPAIR N/A 02/19/2017   Procedure: HERNIA REPAIR EPIGASTRIC ADULT;  Surgeon: Robert Bellow, MD;  Location: [facility] ORS;  Service: General;  Laterality: N/A;  . EYE SURGERY     bilateral cataract  . HERNIA REPAIR     UMBILICAL HERNIA  . HERNIA REPAIR  02/19/2017  . INNER EAR SURGERY     20 years ago  . KIDNEY TRANSPLANT    . Peritoneal Dialysis access placement  02/19/2017  . REMOVAL OF A DIALYSIS CATHETER N/A 11/23/2017   Procedure: REMOVAL OF A PERITONEAL DIALYSIS CATHETER;  Surgeon: Algernon Huxley, MD;  Location: [facility] ORS;  Service: Vascular;  Laterality: N/A;  . TEE WITHOUT CARDIOVERSION N/A 07/21/2016   Procedure: TRANSESOPHAGEAL ECHOCARDIOGRAM (TEE);  Surgeon: Wellington Hampshire, MD;  Location: [facility] ORS;  Service:  Cardiovascular;  Laterality: N/A;    Family History  Problem Relation Age of Onset  . Cancer Mother        pancreatic  . Heart disease Father   . Diabetes Father     Allergies  Allergen Reactions  . Statins     Other reaction(s): Arthralgias (intolerance), Myalgias (intolerance)  . Sertraline Rash       Assessment & Plan:   1. ESRD on hemodialysis [facility]) Recommend:  The patient is doing well and currently has adequate dialysis access. The patient's dialysis center is not reporting any access issues. Flow pattern is stable when compared to the prior ultrasound.  The patient should have a duplex ultrasound of the dialysis access in 6 months. The patient will follow-up with me in the office after each ultrasound     2. Hyperlipidemia, unspecified hyperlipidemia type Continue statin as ordered and reviewed, no changes at this time   3. Essential hypertension Continue antihypertensive medications as already ordered, these medications have been reviewed and there are no changes at this time.    Current Outpatient Medications on File Prior to Visit  Medication Sig Dispense Refill  . acetaminophen (TYLENOL) 500 MG tablet Take 1,000 mg 2 (two) times daily as needed by mouth for moderate pain.    Marland Kitchen allopurinol (ZYLOPRIM) 100 MG tablet Take 2 tablets (200 mg total) by mouth daily. 180 tablet 1  . allopurinol (ZYLOPRIM) 300 MG tablet Take 300 mg by mouth daily.  5  . cinacalcet (SENSIPAR) 60 MG tablet Take 30 mg by mouth daily. At dialysis    . Dextrose-Fructose-Sod Citrate (NAUZENE PO) Take 1-2 tablets 2 (two) times daily as needed by mouth (nausea).    Marland Kitchen  dicyclomine (BENTYL) 10 MG capsule Take 2 capsules (20 mg total) by mouth 3 (three) times daily before meals. 90 capsule 4  . DiphenhydrAMINE HCl, Sleep, 50 MG CAPS Take 50 mg at bedtime as needed by mouth (sleep).    Marland Kitchen FLUoxetine (PROZAC) 10 MG capsule TAKE 1 CAPSULE BY MOUTH EVERY DAY 90 capsule 0  .  HYDROcodone-acetaminophen (NORCO) 5-325 MG tablet Take 1 tablet by mouth every 6 (six) hours as needed for moderate pain. 30 tablet 0  . lidocaine-prilocaine (EMLA) cream Apply 1 application as needed topically (port access).    . metoprolol tartrate (LOPRESSOR) 100 MG tablet Take 0.5 tablets (50 mg total) by mouth 2 (two) times daily. 90 tablet 3  . Multiple Vitamin (MULTIVITAMIN WITH MINERALS) TABS tablet Take 1 tablet by mouth daily.    . multivitamin (RENA-VIT) TABS tablet Take 1 tablet by mouth daily.  4  . nitroGLYCERIN (NITROSTAT) 0.4 MG SL tablet Place 1 tablet (0.4 mg total) under the tongue every 5 (five) minutes as needed for chest pain. 25 tablet 2  . Omega-3 Fatty Acids (FISH OIL) 1200 MG CAPS Take 1,200 mg 2 (two) times daily by mouth.    Marland Kitchen omeprazole (PRILOSEC) 20 MG capsule Take 1 capsule (20 mg total) by mouth daily. 90 capsule 1  . pravastatin (PRAVACHOL) 10 MG tablet TAKE ONE TABLET BY MOUTH ON MONDAY, WEDNESDAY, AND FRIDAY 36 tablet 0  . predniSONE (DELTASONE) 10 MG tablet Take 2 tabs (20mg ) daily through 7/15, then 1 & 1/2 tabs (15mg ) daily through 7/29, then 1 tab (10mg ) daily through 8/12, then 1/2 tab (5mg ) daily through 8/26, then stop    . warfarin (COUMADIN) 1 MG tablet Take 1 tablet (1 mg total) by mouth daily. 180 tablet 1   No current facility-administered medications on file prior to visit.     There are no Patient Instructions on file for this visit. No follow-ups on file.   Kris Hartmann, NP

## 2017-12-30 DIAGNOSIS — Z23 Encounter for immunization: Secondary | ICD-10-CM | POA: Diagnosis not present

## 2017-12-30 DIAGNOSIS — N2581 Secondary hyperparathyroidism of renal origin: Secondary | ICD-10-CM | POA: Diagnosis not present

## 2017-12-30 DIAGNOSIS — N186 End stage renal disease: Secondary | ICD-10-CM | POA: Diagnosis not present

## 2017-12-30 DIAGNOSIS — D631 Anemia in chronic kidney disease: Secondary | ICD-10-CM | POA: Diagnosis not present

## 2018-01-01 ENCOUNTER — Other Ambulatory Visit: Payer: Medicare Other

## 2018-01-01 DIAGNOSIS — D631 Anemia in chronic kidney disease: Secondary | ICD-10-CM | POA: Diagnosis not present

## 2018-01-01 DIAGNOSIS — R972 Elevated prostate specific antigen [PSA]: Secondary | ICD-10-CM

## 2018-01-01 DIAGNOSIS — N2581 Secondary hyperparathyroidism of renal origin: Secondary | ICD-10-CM | POA: Diagnosis not present

## 2018-01-01 DIAGNOSIS — N186 End stage renal disease: Secondary | ICD-10-CM | POA: Diagnosis not present

## 2018-01-01 DIAGNOSIS — Z23 Encounter for immunization: Secondary | ICD-10-CM | POA: Diagnosis not present

## 2018-01-02 LAB — PSA: PROSTATE SPECIFIC AG, SERUM: 2.4 ng/mL (ref 0.0–4.0)

## 2018-01-04 ENCOUNTER — Ambulatory Visit (INDEPENDENT_AMBULATORY_CARE_PROVIDER_SITE_OTHER): Payer: Medicare Other | Admitting: Pharmacist

## 2018-01-04 ENCOUNTER — Ambulatory Visit (INDEPENDENT_AMBULATORY_CARE_PROVIDER_SITE_OTHER): Payer: Medicare Other | Admitting: Urology

## 2018-01-04 ENCOUNTER — Ambulatory Visit: Payer: Medicare Other | Admitting: Urology

## 2018-01-04 ENCOUNTER — Encounter: Payer: Self-pay | Admitting: Urology

## 2018-01-04 VITALS — BP 133/64 | HR 93 | Resp 16 | Ht 70.0 in | Wt 190.6 lb

## 2018-01-04 DIAGNOSIS — I4819 Other persistent atrial fibrillation: Secondary | ICD-10-CM

## 2018-01-04 DIAGNOSIS — I482 Chronic atrial fibrillation: Secondary | ICD-10-CM | POA: Diagnosis not present

## 2018-01-04 DIAGNOSIS — N400 Enlarged prostate without lower urinary tract symptoms: Secondary | ICD-10-CM | POA: Diagnosis not present

## 2018-01-04 DIAGNOSIS — Z7901 Long term (current) use of anticoagulants: Secondary | ICD-10-CM

## 2018-01-04 DIAGNOSIS — R972 Elevated prostate specific antigen [PSA]: Secondary | ICD-10-CM

## 2018-01-04 DIAGNOSIS — Z23 Encounter for immunization: Secondary | ICD-10-CM | POA: Diagnosis not present

## 2018-01-04 DIAGNOSIS — T8610 Unspecified complication of kidney transplant: Secondary | ICD-10-CM | POA: Diagnosis not present

## 2018-01-04 DIAGNOSIS — I481 Persistent atrial fibrillation: Secondary | ICD-10-CM

## 2018-01-04 DIAGNOSIS — N432 Other hydrocele: Secondary | ICD-10-CM | POA: Diagnosis not present

## 2018-01-04 DIAGNOSIS — N186 End stage renal disease: Secondary | ICD-10-CM | POA: Diagnosis not present

## 2018-01-04 DIAGNOSIS — I2510 Atherosclerotic heart disease of native coronary artery without angina pectoris: Secondary | ICD-10-CM

## 2018-01-04 DIAGNOSIS — N2581 Secondary hyperparathyroidism of renal origin: Secondary | ICD-10-CM | POA: Diagnosis not present

## 2018-01-04 DIAGNOSIS — D631 Anemia in chronic kidney disease: Secondary | ICD-10-CM | POA: Diagnosis not present

## 2018-01-04 DIAGNOSIS — Z952 Presence of prosthetic heart valve: Secondary | ICD-10-CM

## 2018-01-04 DIAGNOSIS — Z992 Dependence on renal dialysis: Secondary | ICD-10-CM | POA: Diagnosis not present

## 2018-01-04 LAB — POCT INR: INR: 2.1 (ref 2.0–3.0)

## 2018-01-04 NOTE — Progress Notes (Signed)
I have reviewed the above note and agree. I was available to the pharmacist for consultation.  Tommi Rumps, MD

## 2018-01-04 NOTE — Progress Notes (Signed)
01/04/2018 2:13 PM   Jeremy [name] 1955/06/10 [id number]  Referring provider: Leone Haven, MD [address] [place], [place] [address]  Chief Complaint  Patient presents with  . Elevated PSA    HPI: Patient is a 62 year old Caucasian male with a past medical history of renal transplant currently on dialysis with a history of testosterone deficiency and rising PSA who presents today for follow-up.    Rising PSA PSA Trend  2.18 in 07/2016  2.45 in 04/2017  3.12 in 04/2017  3.4 in 08/2017  3.0 in 09/2017  2.4 in 12/2017  Testosterone deficiency Not currently on therapy.    ESRD First transplant in 1999.  He was found to have [place] in the transplanted kidney and in his bilateral native kidneys.  He underwent native bilateral nephrectomy and renal transplant in 2007.  His next transplant was in 2014.  On dialysis.  Currently in the works to get on a transplant list in November for a hepatitis C kidney.      PMH: Past Medical History:  Diagnosis Date  . 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 Arthritis   . Chronic anticoagulation 06/2012  . Dialysis patient [facility]) 01 01 2008  . Dysrhythmia    A Fib  . Hx of colonoscopy    2009 by Dr. Laural Golden. Normal except for small submucosal lipoma. No evidence of polyps or colitis.  . Hyperlipidemia   . Hypertension   . Hypogonadism in male 07/25/2015  . 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  Marland Kitchen OSA (obstructive sleep apnea)    No CPAP  . Renal cell carcinoma ([place]) 08/04/2016    Surgical History: Past Surgical History:  Procedure Laterality Date  . AORTIC VALVE REPLACEMENT  3 /11 /2014   At [place]    . CAPD INSERTION N/A 02/19/2017   Procedure: LAPAROSCOPIC INSERTION CONTINUOUS AMBULATORY  PERITONEAL DIALYSIS  (CAPD) CATHETER;  Surgeon: Algernon Huxley, MD;  Location: [facility] ORS;  Service: General;  Laterality: N/A;  . CORONARY ANGIOPLASTY WITH STENT PLACEMENT    . DG AV DIALYSIS  SHUNT ACCESS EXIST*L* OR     left arm  . EPIGASTRIC HERNIA REPAIR N/A 02/19/2017   Procedure: HERNIA REPAIR EPIGASTRIC ADULT;  Surgeon: Robert Bellow, MD;  Location: [facility] ORS;  Service: General;  Laterality: N/A;  . EYE SURGERY     bilateral cataract  . HERNIA REPAIR     UMBILICAL HERNIA  . HERNIA REPAIR  02/19/2017  . INNER EAR SURGERY     20 years ago  . KIDNEY TRANSPLANT    . Peritoneal Dialysis access placement  02/19/2017  . REMOVAL OF A DIALYSIS CATHETER N/A 11/23/2017   Procedure: REMOVAL OF A PERITONEAL DIALYSIS CATHETER;  Surgeon: Algernon Huxley, MD;  Location: [facility] ORS;  Service: Vascular;  Laterality: N/A;  . TEE WITHOUT CARDIOVERSION N/A 07/21/2016   Procedure: TRANSESOPHAGEAL ECHOCARDIOGRAM (TEE);  Surgeon: Wellington Hampshire, MD;  Location: [facility] ORS;  Service: Cardiovascular;  Laterality: N/A;    Home Medications:  Allergies as of 01/04/2018      Reactions   Statins    Other reaction(s): Arthralgias (intolerance), Myalgias (intolerance)   Sertraline Rash      Medication List        Accurate as  of 01/04/18  2:13 PM. Always use your most recent med list.          acetaminophen 500 MG tablet Commonly known as:  TYLENOL Take 1,000 mg 2 (two) times daily as needed by mouth for moderate pain.   allopurinol 100 MG tablet Commonly known as:  ZYLOPRIM Take 2 tablets (200 mg total) by mouth daily.   allopurinol 300 MG tablet Commonly known as:  ZYLOPRIM Take 300 mg by mouth daily.   cinacalcet 60 MG tablet Commonly known as:  SENSIPAR Take 30 mg by mouth daily. At dialysis   dicyclomine 10 MG capsule Commonly known as:  BENTYL Take 2 capsules (20 mg total) by mouth 3 (three) times daily before meals.   diphenhydrAMINE HCl (Sleep) 50 MG Caps Take 50 mg at bedtime as needed  by mouth (sleep).   Fish Oil 1200 MG Caps Take 1,200 mg 2 (two) times daily by mouth.   FLUoxetine 10 MG capsule Commonly known as:  PROZAC TAKE 1 CAPSULE BY MOUTH EVERY DAY   HYDROcodone-acetaminophen 5-325 MG tablet Commonly known as:  NORCO/VICODIN Take 1 tablet by mouth every 6 (six) hours as needed for moderate pain.   lidocaine-prilocaine cream Commonly known as:  EMLA Apply 1 application as needed topically (port access).   metoprolol tartrate 100 MG tablet Commonly known as:  LOPRESSOR Take 0.5 tablets (50 mg total) by mouth 2 (two) times daily.   multivitamin Tabs tablet Take 1 tablet by mouth daily.   multivitamin with minerals Tabs tablet Take 1 tablet by mouth daily.   NAUZENE PO Take 1-2 tablets 2 (two) times daily as needed by mouth (nausea).   nitroGLYCERIN 0.4 MG SL tablet Commonly known as:  NITROSTAT Place 1 tablet (0.4 mg total) under the tongue every 5 (five) minutes as needed for chest pain.   omeprazole 20 MG capsule Commonly known as:  PRILOSEC Take 1 capsule (20 mg total) by mouth daily.   pravastatin 10 MG tablet Commonly known as:  PRAVACHOL TAKE ONE TABLET BY MOUTH ON MONDAY, WEDNESDAY, AND FRIDAY   predniSONE 10 MG tablet Commonly known as:  DELTASONE Take 2 tabs (20mg ) daily through 7/15, then 1 & 1/2 tabs (15mg ) daily through 7/29, then 1 tab (10mg ) daily through 8/12, then 1/2 tab (5mg ) daily through 8/26, then stop   warfarin 1 MG tablet Commonly known as:  COUMADIN Take as directed by the anticoagulation clinic. If you are unsure how to take this medication, talk to your nurse or doctor. Original instructions:  Take 1 tablet (1 mg total) by mouth daily.       Allergies:  Allergies  Allergen Reactions  . Statins     Other reaction(s): Arthralgias (intolerance), Myalgias (intolerance)  . Sertraline Rash    Family History: Family History  Problem Relation Age of Onset  . Cancer Mother        pancreatic  . Heart disease  Father   . Diabetes Father     Social History:  reports that he has never smoked. He has never used smokeless tobacco. He reports that he does not drink alcohol or use drugs.  ROS: UROLOGY Frequent Urination?: No Hard to postpone urination?: No Burning/pain with urination?: No Get up at night to urinate?: No Leakage of urine?: No Urine stream starts and stops?: No Trouble starting stream?: No Do you have to strain to urinate?: No Blood in urine?: No Urinary tract infection?: No Sexually transmitted disease?: No Injury to kidneys or bladder?: No  Painful intercourse?: No Weak stream?: No Erection problems?: No Penile pain?: No  Gastrointestinal Nausea?: No Vomiting?: No Indigestion/heartburn?: No Diarrhea?: Yes Constipation?: No  Constitutional Fever: No Night sweats?: No Weight loss?: No Fatigue?: No  Skin Skin rash/lesions?: No Itching?: No  Eyes Blurred vision?: No Double vision?: No  Ears/Nose/Throat Sore throat?: No Sinus problems?: No  Hematologic/Lymphatic Swollen glands?: No Easy bruising?: Yes(pateint is on blood thinner)  Cardiovascular Leg swelling?: No Chest pain?: No  Respiratory Cough?: No Shortness of breath?: No  Endocrine Excessive thirst?: No  Musculoskeletal Back pain?: No Joint pain?: Yes  Neurological Headaches?: No Dizziness?: No  Psychologic Depression?: No Anxiety?: No  Physical Exam: BP 133/64   Pulse 93   Resp 16   Ht 5\' 10"  (1.778 m)   Wt 190 lb 9.6 oz (86.5 kg)   BMI 27.35 kg/m   Constitutional: Well nourished. Alert and oriented, No acute distress. HEENT: [place] AT, moist mucus membranes. Trachea midline, no masses. Cardiovascular: No clubbing, cyanosis, or edema. Respiratory: Normal respiratory effort, no increased work of breathing. GI: Abdomen is soft, non tender, non distended, no abdominal masses. Liver and spleen not palpable.  No hernias appreciated.  Stool sample for occult testing is not  indicated.   GU: No CVA tenderness.  No bladder fullness or masses.  Patient with circumcised phallus.  Urethral meatus is patent.  No penile discharge. No penile lesions or rashes. Scrotum without lesions, cysts, rashes and/or edema.  Testicles are located scrotally bilaterally. No masses are appreciated in the testicles. Right epididymis are normal.  Left hydrocele noted.   Rectal: Patient with  normal sphincter tone. Anus and perineum without scarring or rashes. No rectal masses are appreciated. Prostate is approximately 55 grams, could only palpate the apex and midportion of the gland, no nodules are appreciated.  Skin: No rashes, bruises or suspicious lesions. Lymph: No cervical or inguinal adenopathy. Neurologic: Grossly intact, no focal deficits, moving all 4 extremities. Psychiatric: Normal mood and affect.   Laboratory Data: Lab Results  Component Value Date   WBC 8.3 05/06/2017   HGB 10.5 (L) 11/23/2017   HCT 31.0 (L) 11/23/2017   MCV 101.5 (H) 05/06/2017   PLT 149 (L) 05/06/2017    Lab Results  Component Value Date   CREATININE 14.76 (H) 05/06/2017    Lab Results  Component Value Date   PSA 2.18 07/07/2016    Lab Results  Component Value Date   TESTOSTERONE 200 (L) 05/06/2017    Lab Results  Component Value Date   HGBA1C 5.2 08/08/2016    Urinalysis    Component Value Date/Time   COLORURINE AMBER (A) 07/17/2016 1531   APPEARANCEUR CLOUDY (A) 07/17/2016 1531   LABSPEC 1.030 07/17/2016 1531   PHURINE 8.0 07/17/2016 1531   GLUCOSEU 50 (A) 07/17/2016 1531   HGBUR NEGATIVE 07/17/2016 1531   BILIRUBINUR NEGATIVE 07/17/2016 1531   KETONESUR NEGATIVE 07/17/2016 1531   PROTEINUR 100 (A) 07/17/2016 1531   NITRITE NEGATIVE 07/17/2016 1531   LEUKOCYTESUR NEGATIVE 07/17/2016 1531   I have reviewed the labs.  Assessment & Plan:    1. Rising PSA PSA is returning to baseline RTC in 6 months for PSA and exam   2. Testosterone deficiency Patient is not on  testosterone therapy at this time  3. ESRD Currently on hemodialysis Hoping to be eligible for the kidney transplant list in November  4. Hydrocele Patient would like to consider hydrocelectomy when he returns in 6 months  Return in about 6 months (  around 07/05/2018) for IPSS, PSA and exam.  Zara Council, [facility]  [place] [address] [place], [place] [address] [phone]

## 2018-01-04 NOTE — Patient Instructions (Signed)
Hydrocele, Adult A hydrocele is a collection of fluid in the loose pouch of skin that holds the testicles (scrotum). Usually, it affects only one testicle. What are the causes? This condition may be caused by:  An injury to the scrotum.  An infection.  A tumor or cancer of the testicle.  Twisting of a testicle.  Decreased blood flow to the scrotum.  What are the signs or symptoms? A hydrocele feels like a water-filled balloon. It may also feel heavy. A hydrocele can cause:  Swelling of the scrotum. The swelling may decrease when you lie down.  Swelling of the groin.  Mild discomfort in the scrotum.  Pain. This can develop if the hydrocele was caused by infection or twisting.  How is this diagnosed? This condition may be diagnosed with a medical history, physical exam, and imaging tests. You may also have blood and urine tests to check for infection. How is this treated? Treatment may include:  Watching and waiting, particularly if the hydrocele causes no symptoms.  Treatment of the underlying condition. This may include using antibiotic medicine.  Surgery to drain the fluid. Some surgical options include: ? Needle aspiration. For this procedure, a needle is used to drain fluid. ? Hydrocelectomy. For this procedure, an incision is made in the scrotum to remove the fluid sac.  Follow these instructions at home:  Keep all follow-up visits as told by your health care provider. This is important.  Watch the hydrocele for any changes.  Take over-the-counter and prescription medicines only as told by your health care provider.  If you were prescribed an antibiotic medicine, use it as told by your health care provider. Do not stop using the antibiotic even if your condition improves. Contact a health care provider if:  The swelling in your scrotum or groin gets worse.  The hydrocele becomes red, firm, tender to the touch, or painful.  You notice any changes in the  hydrocele.  You have a fever. This information is not intended to replace advice given to you by your health care provider. Make sure you discuss any questions you have with your health care provider. Document Released: 09/11/2009 Document Revised: 08/30/2015 Document Reviewed: 03/20/2014 Elsevier Interactive Patient Education  2018 [place], Adult, Care After This sheet gives you information about how to care for yourself after your procedure. Your health care provider may also give you more specific instructions. If you have problems or questions, contact your health care provider. What can I expect after the procedure? After your procedure, it is common to have mild discomfort, swelling, and bruising in the pouch that holds your testicles (scrotum). Follow these instructions at home: Bathing  Ask your health care provider when you can shower, take baths, or go swimming.  If you were told to wear an athletic support strap, take it off when you shower or take a bath. Incision care  Follow instructions from your health care provider about how to take care of your incision. Make sure you: ? Wash your hands with soap and water before you change your bandage (dressing). If soap and water are not available, use hand sanitizer. ? Change your dressing as told by your health care provider. ? Leave stitches (sutures) in place.  Check your incision and scrotum every day for signs of infection. Check for: ? More redness, swelling, or pain. ? Blood or fluid. ? Warmth. ? Pus or a bad smell. Managing pain, stiffness, and swelling  If directed, apply ice  to the injured area: ? Put ice in a plastic bag. ? Place a towel between your skin and the bag. ? Leave the ice on for 20 minutes, 2-3 times per day. Driving  Do not drive for 24 hours if you were given a sedative.  Do not drive or use heavy machinery while taking prescription pain medicine.  Ask your health care  provider when it is safe to drive. Activity  Do not do any activities that require great strength and energy (are vigorous) for as long as told by your health care provider.  Return to your normal activities as told by your health care provider. Ask your health care provider what activities are safe for you.  Do not lift anything that is heavier than 10 lb (4.5 kg) until your health care provider says that it is safe. General instructions  Take over-the-counter and prescription medicines only as told by your health care provider.  Keep all follow-up visits as told by your health care provider. This is important.  If you were given an athletic support strap, wear it as told by your health care provider.  If you had a drain put in during the procedure, you will need to return for a follow-up visit to have it removed. Contact a health care provider if:  Your pain is not controlled with medicine.  You have more redness or swelling around your scrotum.  You have blood or fluid coming from your scrotum.  Your incision feels warm to the touch.  You have pus or a bad smell coming from your scrotum.  You have a fever. This information is not intended to replace advice given to you by your health care provider. Make sure you discuss any questions you have with your health care provider. Document Released: 12/13/2014 Document Revised: 12/22/2015 Document Reviewed: 12/22/2015 Elsevier Interactive Patient Education  2018 [place], Adult A hydrocelectomy is a surgical procedure to remove a collection of fluid (hydrocele) from the pouch that holds the testicles (scrotum). You may need to have a hydrocelectomy if a hydrocele is making your scrotum swell painfully. Tell a health care provider about:  Any allergies you have.  All medicines you are taking, including vitamins, herbs, eye drops, creams, and over-the-counter medicines.  Any problems you or family members  have had with anesthetic medicines.  Any blood disorders you have.  Any surgeries you have had.  Any medical conditions you have. What are the risks? Generally this is a safe procedure. However, problems may occur, including:  Bleeding into the scrotum (scrotal hematoma).  Damage to the testicle or the tube that carries sperm out of the testicle (vas deferens).  Infection.  Allergic reactions to medicines.  What happens before the procedure? Staying hydrated Follow instructions from your health care provider about hydration, which may include:  Up to 2 hours before the procedure - you may continue to drink clear liquids, such as water, clear fruit juice, black coffee, and plain tea.  Eating and drinking restrictions Follow instructions from your health care provider about eating and drinking, which may include:  8 hours before the procedure - stop eating heavy meals or foods such as meat, fried foods, or fatty foods.  6 hours before the procedure - stop eating light meals or foods, such as toast or cereal.  6 hours before the procedure - stop drinking milk or drinks that contain milk.  2 hours before the procedure - stop drinking clear liquids.  Medicines  Ask your health care provider about: ? Changing or stopping your regular medicines. This is especially important if you are taking diabetes medicines or blood thinners. ? Taking medicines such as aspirin and ibuprofen. These medicines can thin your blood. Do not take these medicines before your procedure if your health care provider instructs you not to.  You may be given antibiotic medicine to help prevent infection. General instructions  Plan to have someone take you home after the procedure. What happens during the procedure?  To reduce your risk of infection: ? Your health care team will wash or sanitize their hands. ? A germ-killing solution (antiseptic) will be used to wash your scrotum and the area around it.  Hair may be removed from this area.  An IV tube will be inserted into one of your veins.  You will be given one or more of the following: ? A medicine to make you relax (sedative). ? A medicine to make you fall asleep (general anesthetic).  A small incision will be made through the skin of your scrotum.  Your testicle and the hydrocele will be located, and the hydrocele sac will be opened with an incision.  The fluid will be drained from the hydrocele.  The hydrocele will be closed with absorbable stitches (sutures) to prevent fluid from building up again.  If you have a large hydrocele, a thin rubber drain may be placed to allow fluid to drain after the procedure.  The incision in your scrotum will be closed with absorbable sutures.  A bandage (dressing) will be placed over the incision. The procedure may vary among health care providers and hospitals. What happens after the procedure?  Your blood pressure, heart rate, breathing rate, and blood oxygen level will be monitored until the medicines you were given have worn off.  You will be given pain medicine as needed.  Do not drive for 24 hours if you were given a sedative.  You may have to wear an athletic support strap to hold the dressing in place and support your scrotum. This information is not intended to replace advice given to you by your health care provider. Make sure you discuss any questions you have with your health care provider. Document Released: 12/13/2014 Document Revised: 12/22/2015 Document Reviewed: 12/22/2015 Elsevier Interactive Patient Education  [employer].

## 2018-01-06 DIAGNOSIS — D631 Anemia in chronic kidney disease: Secondary | ICD-10-CM | POA: Diagnosis not present

## 2018-01-06 DIAGNOSIS — N186 End stage renal disease: Secondary | ICD-10-CM | POA: Diagnosis not present

## 2018-01-06 DIAGNOSIS — N2581 Secondary hyperparathyroidism of renal origin: Secondary | ICD-10-CM | POA: Diagnosis not present

## 2018-01-08 DIAGNOSIS — N2581 Secondary hyperparathyroidism of renal origin: Secondary | ICD-10-CM | POA: Diagnosis not present

## 2018-01-08 DIAGNOSIS — D631 Anemia in chronic kidney disease: Secondary | ICD-10-CM | POA: Diagnosis not present

## 2018-01-08 DIAGNOSIS — N186 End stage renal disease: Secondary | ICD-10-CM | POA: Diagnosis not present

## 2018-01-11 DIAGNOSIS — N2581 Secondary hyperparathyroidism of renal origin: Secondary | ICD-10-CM | POA: Diagnosis not present

## 2018-01-11 DIAGNOSIS — N186 End stage renal disease: Secondary | ICD-10-CM | POA: Diagnosis not present

## 2018-01-11 DIAGNOSIS — D631 Anemia in chronic kidney disease: Secondary | ICD-10-CM | POA: Diagnosis not present

## 2018-01-12 LAB — POCT INR: INR: 2.4 (ref 2.0–3.0)

## 2018-01-13 ENCOUNTER — Ambulatory Visit (INDEPENDENT_AMBULATORY_CARE_PROVIDER_SITE_OTHER): Payer: Medicare Other | Admitting: Pharmacist

## 2018-01-13 DIAGNOSIS — I4891 Unspecified atrial fibrillation: Secondary | ICD-10-CM | POA: Diagnosis not present

## 2018-01-13 DIAGNOSIS — Z7901 Long term (current) use of anticoagulants: Secondary | ICD-10-CM

## 2018-01-13 DIAGNOSIS — Z952 Presence of prosthetic heart valve: Secondary | ICD-10-CM

## 2018-01-13 DIAGNOSIS — N2581 Secondary hyperparathyroidism of renal origin: Secondary | ICD-10-CM | POA: Diagnosis not present

## 2018-01-13 DIAGNOSIS — N186 End stage renal disease: Secondary | ICD-10-CM | POA: Diagnosis not present

## 2018-01-13 DIAGNOSIS — D631 Anemia in chronic kidney disease: Secondary | ICD-10-CM | POA: Diagnosis not present

## 2018-01-14 DIAGNOSIS — M810 Age-related osteoporosis without current pathological fracture: Secondary | ICD-10-CM | POA: Diagnosis not present

## 2018-01-14 NOTE — Progress Notes (Signed)
I have reviewed the above note and agree. I was available to the pharmacist for consultation.  Tommi Rumps, MD

## 2018-01-15 DIAGNOSIS — N2581 Secondary hyperparathyroidism of renal origin: Secondary | ICD-10-CM | POA: Diagnosis not present

## 2018-01-15 DIAGNOSIS — N186 End stage renal disease: Secondary | ICD-10-CM | POA: Diagnosis not present

## 2018-01-15 DIAGNOSIS — D631 Anemia in chronic kidney disease: Secondary | ICD-10-CM | POA: Diagnosis not present

## 2018-01-17 ENCOUNTER — Other Ambulatory Visit: Payer: Self-pay | Admitting: Family Medicine

## 2018-01-18 ENCOUNTER — Ambulatory Visit (INDEPENDENT_AMBULATORY_CARE_PROVIDER_SITE_OTHER): Payer: Medicare Other | Admitting: Pharmacist

## 2018-01-18 DIAGNOSIS — D631 Anemia in chronic kidney disease: Secondary | ICD-10-CM | POA: Diagnosis not present

## 2018-01-18 DIAGNOSIS — N186 End stage renal disease: Secondary | ICD-10-CM | POA: Diagnosis not present

## 2018-01-18 DIAGNOSIS — N2581 Secondary hyperparathyroidism of renal origin: Secondary | ICD-10-CM | POA: Diagnosis not present

## 2018-01-18 DIAGNOSIS — Z7901 Long term (current) use of anticoagulants: Secondary | ICD-10-CM

## 2018-01-18 DIAGNOSIS — Z952 Presence of prosthetic heart valve: Secondary | ICD-10-CM

## 2018-01-18 DIAGNOSIS — I4891 Unspecified atrial fibrillation: Secondary | ICD-10-CM

## 2018-01-18 LAB — POCT INR: INR: 3.2 — AB (ref 2.0–3.0)

## 2018-01-19 NOTE — Progress Notes (Signed)
I have reviewed the above note and agree. I was available to the pharmacist for consultation.  Tommi Rumps, MD

## 2018-01-20 DIAGNOSIS — N186 End stage renal disease: Secondary | ICD-10-CM | POA: Diagnosis not present

## 2018-01-20 DIAGNOSIS — D631 Anemia in chronic kidney disease: Secondary | ICD-10-CM | POA: Diagnosis not present

## 2018-01-20 DIAGNOSIS — N2581 Secondary hyperparathyroidism of renal origin: Secondary | ICD-10-CM | POA: Diagnosis not present

## 2018-01-22 DIAGNOSIS — N186 End stage renal disease: Secondary | ICD-10-CM | POA: Diagnosis not present

## 2018-01-22 DIAGNOSIS — N2581 Secondary hyperparathyroidism of renal origin: Secondary | ICD-10-CM | POA: Diagnosis not present

## 2018-01-22 DIAGNOSIS — D631 Anemia in chronic kidney disease: Secondary | ICD-10-CM | POA: Diagnosis not present

## 2018-01-23 ENCOUNTER — Other Ambulatory Visit: Payer: Self-pay | Admitting: Family Medicine

## 2018-01-25 ENCOUNTER — Ambulatory Visit (INDEPENDENT_AMBULATORY_CARE_PROVIDER_SITE_OTHER): Payer: Medicare Other | Admitting: Pharmacist

## 2018-01-25 DIAGNOSIS — Z7901 Long term (current) use of anticoagulants: Secondary | ICD-10-CM | POA: Diagnosis not present

## 2018-01-25 DIAGNOSIS — N2581 Secondary hyperparathyroidism of renal origin: Secondary | ICD-10-CM | POA: Diagnosis not present

## 2018-01-25 DIAGNOSIS — D631 Anemia in chronic kidney disease: Secondary | ICD-10-CM | POA: Diagnosis not present

## 2018-01-25 DIAGNOSIS — N186 End stage renal disease: Secondary | ICD-10-CM | POA: Diagnosis not present

## 2018-01-25 DIAGNOSIS — I4891 Unspecified atrial fibrillation: Secondary | ICD-10-CM

## 2018-01-25 DIAGNOSIS — Z952 Presence of prosthetic heart valve: Secondary | ICD-10-CM

## 2018-01-25 LAB — POCT INR: INR: 4.7 — AB (ref 2.0–3.0)

## 2018-01-25 NOTE — Progress Notes (Signed)
I have reviewed the above note and agree. I was available to the pharmacist for consultation.  Tommi Rumps, MD

## 2018-01-27 ENCOUNTER — Telehealth: Payer: Self-pay | Admitting: Family Medicine

## 2018-01-27 DIAGNOSIS — N2581 Secondary hyperparathyroidism of renal origin: Secondary | ICD-10-CM | POA: Diagnosis not present

## 2018-01-27 DIAGNOSIS — N186 End stage renal disease: Secondary | ICD-10-CM | POA: Diagnosis not present

## 2018-01-27 DIAGNOSIS — D631 Anemia in chronic kidney disease: Secondary | ICD-10-CM | POA: Diagnosis not present

## 2018-01-27 NOTE — Telephone Encounter (Signed)
Sent to PCP ?

## 2018-01-27 NOTE — Telephone Encounter (Signed)
[place] called from [facility] INR Pt's INR dated 01/26/18 was 4.7.  [place] stated for any dosing changes to call the pt.

## 2018-01-27 NOTE — Telephone Encounter (Signed)
Jeremy [name] has already spoken with the patient and dealt with this change in medication.

## 2018-01-29 DIAGNOSIS — D631 Anemia in chronic kidney disease: Secondary | ICD-10-CM | POA: Diagnosis not present

## 2018-01-29 DIAGNOSIS — N2581 Secondary hyperparathyroidism of renal origin: Secondary | ICD-10-CM | POA: Diagnosis not present

## 2018-01-29 DIAGNOSIS — N186 End stage renal disease: Secondary | ICD-10-CM | POA: Diagnosis not present

## 2018-02-01 ENCOUNTER — Ambulatory Visit (INDEPENDENT_AMBULATORY_CARE_PROVIDER_SITE_OTHER): Payer: Medicare Other | Admitting: Pharmacist

## 2018-02-01 DIAGNOSIS — I4891 Unspecified atrial fibrillation: Secondary | ICD-10-CM | POA: Diagnosis not present

## 2018-02-01 DIAGNOSIS — Z7901 Long term (current) use of anticoagulants: Secondary | ICD-10-CM

## 2018-02-01 DIAGNOSIS — I4821 Permanent atrial fibrillation: Secondary | ICD-10-CM | POA: Diagnosis not present

## 2018-02-01 DIAGNOSIS — I482 Chronic atrial fibrillation, unspecified: Secondary | ICD-10-CM | POA: Diagnosis not present

## 2018-02-01 DIAGNOSIS — Z952 Presence of prosthetic heart valve: Secondary | ICD-10-CM

## 2018-02-01 DIAGNOSIS — N186 End stage renal disease: Secondary | ICD-10-CM | POA: Diagnosis not present

## 2018-02-01 DIAGNOSIS — N2581 Secondary hyperparathyroidism of renal origin: Secondary | ICD-10-CM | POA: Diagnosis not present

## 2018-02-01 DIAGNOSIS — D631 Anemia in chronic kidney disease: Secondary | ICD-10-CM | POA: Diagnosis not present

## 2018-02-01 LAB — POCT INR: INR: 2.5 (ref 2.0–3.0)

## 2018-02-02 NOTE — Progress Notes (Signed)
I have reviewed the above note and agree. I was available to the pharmacist for consultation.  Tommi Rumps, MD

## 2018-02-03 DIAGNOSIS — N186 End stage renal disease: Secondary | ICD-10-CM | POA: Diagnosis not present

## 2018-02-03 DIAGNOSIS — M2550 Pain in unspecified joint: Secondary | ICD-10-CM | POA: Diagnosis not present

## 2018-02-03 DIAGNOSIS — M8580 Other specified disorders of bone density and structure, unspecified site: Secondary | ICD-10-CM | POA: Diagnosis not present

## 2018-02-03 DIAGNOSIS — E790 Hyperuricemia without signs of inflammatory arthritis and tophaceous disease: Secondary | ICD-10-CM | POA: Diagnosis not present

## 2018-02-03 DIAGNOSIS — M109 Gout, unspecified: Secondary | ICD-10-CM | POA: Diagnosis not present

## 2018-02-03 DIAGNOSIS — M810 Age-related osteoporosis without current pathological fracture: Secondary | ICD-10-CM | POA: Diagnosis not present

## 2018-02-03 DIAGNOSIS — D631 Anemia in chronic kidney disease: Secondary | ICD-10-CM | POA: Diagnosis not present

## 2018-02-03 DIAGNOSIS — M7989 Other specified soft tissue disorders: Secondary | ICD-10-CM | POA: Diagnosis not present

## 2018-02-03 DIAGNOSIS — Z6827 Body mass index (BMI) 27.0-27.9, adult: Secondary | ICD-10-CM | POA: Diagnosis not present

## 2018-02-03 DIAGNOSIS — N2581 Secondary hyperparathyroidism of renal origin: Secondary | ICD-10-CM | POA: Diagnosis not present

## 2018-02-03 DIAGNOSIS — M150 Primary generalized (osteo)arthritis: Secondary | ICD-10-CM | POA: Diagnosis not present

## 2018-02-03 DIAGNOSIS — E663 Overweight: Secondary | ICD-10-CM | POA: Diagnosis not present

## 2018-02-04 ENCOUNTER — Ambulatory Visit (INDEPENDENT_AMBULATORY_CARE_PROVIDER_SITE_OTHER): Payer: Medicare Other | Admitting: Urology

## 2018-02-04 ENCOUNTER — Encounter: Payer: Self-pay | Admitting: Urology

## 2018-02-04 VITALS — BP 137/64 | HR 99 | Ht 70.0 in | Wt 190.7 lb

## 2018-02-04 DIAGNOSIS — I2510 Atherosclerotic heart disease of native coronary artery without angina pectoris: Secondary | ICD-10-CM | POA: Diagnosis not present

## 2018-02-04 DIAGNOSIS — R369 Urethral discharge, unspecified: Secondary | ICD-10-CM | POA: Diagnosis not present

## 2018-02-04 DIAGNOSIS — N186 End stage renal disease: Secondary | ICD-10-CM | POA: Diagnosis not present

## 2018-02-04 DIAGNOSIS — Z992 Dependence on renal dialysis: Secondary | ICD-10-CM | POA: Diagnosis not present

## 2018-02-04 DIAGNOSIS — T8610 Unspecified complication of kidney transplant: Secondary | ICD-10-CM | POA: Diagnosis not present

## 2018-02-04 NOTE — Progress Notes (Signed)
02/04/2018 3:03 PM   Jeremy [name] [birth year] [id number]  Referring provider: Leone Haven, MD [address] [place], [place] [address]  Chief Complaint  Patient presents with  . Groin Swelling    HPI: Patient is a 62 year old Caucasian male with a past medical history of renal transplant currently on dialysis with a history of testosterone deficiency and a history of a rising PSA who presents today with a complaint of a penile discharge.  He states he has been noticing this discharge off and on for the last several weeks.  He notices it on the toilet seat and on the toilet paper when he wipes from time to time.  He states that it looks like snot.  He has not had any hematuria, penile pain suprapubic pain.  Patient denies any gross hematuria, dysuria or suprapubic/flank pain.  Patient denies any fevers, chills, nausea or vomiting.   Rising PSA PSA Trend  2.18 in 07/2016  2.45 in 04/2017  3.12 in 04/2017  3.4 in 08/2017  3.0 in 09/2017  2.4 in 12/2017  Testosterone deficiency Not currently on therapy.    ESRD First transplant in 1999.  He was found to have [place] in the transplanted kidney and in his bilateral native kidneys.  He underwent native bilateral nephrectomy and renal transplant in 2007.  His next transplant was in 2014.  On dialysis.  Currently in the works to get on a transplant list in November for a hepatitis C kidney.      PMH: Past Medical History:  Diagnosis Date  . 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 Arthritis   . Chronic anticoagulation 06/2012  . Dialysis patient [facility]) 01 01 2008  . Dysrhythmia    A Fib  . Hx of colonoscopy    2009 by Dr. Laural Golden. Normal except for small submucosal lipoma. No evidence of polyps or colitis.  . Hyperlipidemia   . Hypertension   . Hypogonadism in male 07/25/2015  . 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 Kidney transplant in 2000 and renal  cell carcinoma and transplanted kidney removed along with his own diseased kidneys.  Marland Kitchen OSA (obstructive sleep apnea)    No CPAP  . Renal cell carcinoma ([place]) 08/04/2016    Surgical History: Past Surgical History:  Procedure Laterality Date  . AORTIC VALVE REPLACEMENT  3 /11 /2014   [place]    . CAPD INSERTION N/A 02/19/2017   Procedure: LAPAROSCOPIC INSERTION CONTINUOUS AMBULATORY PERITONEAL DIALYSIS  (CAPD) CATHETER;  Surgeon: Algernon Huxley, MD;  Location: [facility] ORS;  Service: General;  Laterality: N/A;  . CORONARY ANGIOPLASTY WITH STENT PLACEMENT    . DG AV DIALYSIS  SHUNT ACCESS EXIST*L* OR     left arm  . EPIGASTRIC HERNIA REPAIR N/A 02/19/2017   Procedure: HERNIA REPAIR EPIGASTRIC ADULT;  Surgeon: Robert Bellow, MD;  Location: [facility] ORS;  Service: General;  Laterality: N/A;  . EYE SURGERY     bilateral cataract  . HERNIA REPAIR     UMBILICAL HERNIA  . HERNIA REPAIR  02/19/2017  . INNER EAR SURGERY     20 years ago  . KIDNEY TRANSPLANT    . Peritoneal Dialysis access placement  02/19/2017  . REMOVAL OF A DIALYSIS CATHETER N/A 11/23/2017   Procedure: REMOVAL OF A PERITONEAL DIALYSIS CATHETER;  Surgeon: Algernon Huxley, MD;  Location: [facility] ORS;  Service: Vascular;  Laterality: N/A;  . TEE WITHOUT CARDIOVERSION N/A 07/21/2016   Procedure: TRANSESOPHAGEAL ECHOCARDIOGRAM (TEE);  Surgeon: Wellington Hampshire, MD;  Location: [facility] ORS;  Service: Cardiovascular;  Laterality: N/A;    Home Medications:  Allergies as of 02/04/2018      Reactions   Statins    Other reaction(s): Arthralgias (intolerance), Myalgias (intolerance)   Sertraline Rash      Medication List        Accurate as of 02/04/18  3:03 PM. Always use your most recent med list.          acetaminophen 500 MG tablet Commonly known as:  TYLENOL Take 1,000 mg 2 (two) times daily as needed by mouth for moderate pain.   allopurinol 100 MG tablet Commonly known as:   ZYLOPRIM Take 2 tablets (200 mg total) by mouth daily.   allopurinol 300 MG tablet Commonly known as:  ZYLOPRIM Take 300 mg by mouth daily.   cinacalcet 30 MG tablet Commonly known as:  SENSIPAR Take 30 mg by mouth daily. At dialysis   dicyclomine 10 MG capsule Commonly known as:  BENTYL Take 2 capsules (20 mg total) by mouth 3 (three) times daily before meals.   diphenhydrAMINE HCl (Sleep) 50 MG Caps Take 50 mg at bedtime as needed by mouth (sleep).   Fish Oil 1200 MG Caps Take 1,200 mg 2 (two) times daily by mouth.   FLUoxetine 10 MG capsule Commonly known as:  PROZAC TAKE 1 CAPSULE BY MOUTH EVERY DAY   HYDROcodone-acetaminophen 5-325 MG tablet Commonly known as:  NORCO/VICODIN Take 1 tablet by mouth every 6 (six) hours as needed for moderate pain.   lidocaine-prilocaine cream Commonly known as:  EMLA Apply 1 application as needed topically (port access).   metoprolol tartrate 100 MG tablet Commonly known as:  LOPRESSOR Take 0.5 tablets (50 mg total) by mouth 2 (two) times daily.   multivitamin Tabs tablet Take 1 tablet by mouth daily.   multivitamin with minerals Tabs tablet Take 1 tablet by mouth daily.   NAUZENE PO Take 1-2 tablets 2 (two) times daily as needed by mouth (nausea).   nitroGLYCERIN 0.4 MG SL tablet Commonly known as:  NITROSTAT Place 1 tablet (0.4 mg total) under the tongue every 5 (five) minutes as needed for chest pain.   omeprazole 20 MG capsule Commonly known as:  PRILOSEC Take 1 capsule (20 mg total) by mouth daily.   pravastatin 10 MG tablet Commonly known as:  PRAVACHOL TAKE ONE TABLET BY MOUTH ON MONDAY, WEDNESDAY, AND FRIDAY   predniSONE 5 MG tablet Commonly known as:  DELTASONE 5 mg.   warfarin 1 MG tablet Commonly known as:  COUMADIN Take as directed by the anticoagulation clinic. If you are unsure how to take this medication, talk to your nurse or doctor. Original instructions:  Take 1 tablet (1 mg total) by mouth  daily.       Allergies:  Allergies  Allergen Reactions  . Statins     Other reaction(s): Arthralgias (intolerance), Myalgias (intolerance)  . Sertraline Rash    Family History: Family History  Problem Relation Age of Onset  . Cancer Mother        pancreatic  . Heart disease Father   . Diabetes Father     Social History:  reports that he has never smoked. He has never used smokeless tobacco. He reports that he does not drink alcohol or use drugs.  ROS: UROLOGY Frequent Urination?: No Hard to postpone urination?:  No Burning/pain with urination?: No Get up at night to urinate?: No Leakage of urine?: No Urine stream starts and stops?: No Trouble starting stream?: No Do you have to strain to urinate?: No Blood in urine?: No Urinary tract infection?: No Sexually transmitted disease?: No Injury to kidneys or bladder?: Yes Painful intercourse?: No Weak stream?: No Erection problems?: Yes Penile pain?: No  Gastrointestinal Nausea?: Yes Vomiting?: No Indigestion/heartburn?: No Diarrhea?: Yes Constipation?: No  Constitutional Fever: No Night sweats?: No Weight loss?: No Fatigue?: Yes  Skin Skin rash/lesions?: No Itching?: No  Eyes Blurred vision?: No Double vision?: No  Ears/Nose/Throat Sore throat?: No Sinus problems?: Yes  Hematologic/Lymphatic Swollen glands?: No Easy bruising?: Yes  Cardiovascular Leg swelling?: No Chest pain?: No  Respiratory Cough?: Yes Shortness of breath?: No  Endocrine Excessive thirst?: No  Musculoskeletal Back pain?: No Joint pain?: Yes  Neurological Headaches?: No Dizziness?: No  Psychologic Depression?: No Anxiety?: No  Physical Exam: BP 137/64 (BP Location: Left Arm, Patient Position: Sitting, Cuff Size: Normal)   Pulse 99   Ht 5\' 10"  (1.778 m)   Wt 190 lb 11.2 oz (86.5 kg)   BMI 27.36 kg/m   Constitutional: Well nourished. Alert and oriented, No acute distress. HEENT: [place] AT, moist mucus  membranes. Trachea midline, no masses. Cardiovascular: No clubbing, cyanosis, or edema. Respiratory: Normal respiratory effort, no increased work of breathing. GI: Abdomen is soft, non tender, non distended, no abdominal masses. Liver and spleen not palpable.  No hernias appreciated.  Stool sample for occult testing is not indicated.   GU: No CVA tenderness.  No bladder fullness or masses.  Patient with circumcised phallus.  Urethral meatus is patent.  No penile discharge. No penile lesions or rashes. Scrotum without lesions, cysts, rashes and/or edema.  Right testicle is located scrotally.  Moderate left hydrocele is noted testicle could not be appreciated due to the fluid.  Right epididymis are normal. Rectal: Patient with  normal sphincter tone. Anus and perineum without scarring or rashes. No rectal masses are appreciated. Prostate is approximately 50 grams, no nodules are appreciated. Seminal vesicles are normal. Skin: No rashes, bruises or suspicious lesions. Lymph: No cervical or inguinal adenopathy. Neurologic: Grossly intact, no focal deficits, moving all 4 extremities.  Fistula noted in the left arm.   Psychiatric: Normal mood and affect.   Laboratory Data: Lab Results  Component Value Date   WBC 8.3 05/06/2017   HGB 10.5 (L) 11/23/2017   HCT 31.0 (L) 11/23/2017   MCV 101.5 (H) 05/06/2017   PLT 149 (L) 05/06/2017    Lab Results  Component Value Date   CREATININE 14.76 (H) 05/06/2017    Lab Results  Component Value Date   PSA 2.18 07/07/2016    Lab Results  Component Value Date   TESTOSTERONE 200 (L) 05/06/2017    Lab Results  Component Value Date   HGBA1C 5.2 08/08/2016    Urinalysis    Component Value Date/Time   COLORURINE AMBER (A) 07/17/2016 1531   APPEARANCEUR CLOUDY (A) 07/17/2016 1531   LABSPEC 1.030 07/17/2016 1531   PHURINE 8.0 07/17/2016 1531   GLUCOSEU 50 (A) 07/17/2016 1531   HGBUR NEGATIVE 07/17/2016 1531   BILIRUBINUR NEGATIVE 07/17/2016  1531   KETONESUR NEGATIVE 07/17/2016 1531   PROTEINUR 100 (A) 07/17/2016 1531   NITRITE NEGATIVE 07/17/2016 1531   LEUKOCYTESUR NEGATIVE 07/17/2016 1531   I have reviewed the labs.  Assessment & Plan:    1. Discharge Explained to the patient that this is  likely the lining of the bladder sloughing off and clumps rather than being washed away as he is not making urine I have given him a collection cup so that he can collect the specimen for our evaluation as he is quite anxious about what this might be Patient is advised that if they should start to experience pain in the penis, suprapubic area or perineum that is not able to be controlled with pain medication, intractable nausea and/or vomiting and/or fevers greater than 103 or shaking chills to contact the office immediately or seek treatment in the emergency department for emergent intervention.     Return for pending labs.  Zara Council, PA-C  [facility] [address] [place], [place] [address] [phone]

## 2018-02-05 ENCOUNTER — Ambulatory Visit (INDEPENDENT_AMBULATORY_CARE_PROVIDER_SITE_OTHER): Payer: Medicare Other | Admitting: Family Medicine

## 2018-02-05 ENCOUNTER — Encounter: Payer: Self-pay | Admitting: Family Medicine

## 2018-02-05 VITALS — BP 132/76 | HR 97 | Temp 99.0°F | Ht 70.0 in | Wt 190.8 lb

## 2018-02-05 DIAGNOSIS — N2581 Secondary hyperparathyroidism of renal origin: Secondary | ICD-10-CM | POA: Diagnosis not present

## 2018-02-05 DIAGNOSIS — J019 Acute sinusitis, unspecified: Secondary | ICD-10-CM | POA: Diagnosis not present

## 2018-02-05 DIAGNOSIS — N186 End stage renal disease: Secondary | ICD-10-CM | POA: Diagnosis not present

## 2018-02-05 DIAGNOSIS — R0982 Postnasal drip: Secondary | ICD-10-CM

## 2018-02-05 DIAGNOSIS — R05 Cough: Secondary | ICD-10-CM | POA: Diagnosis not present

## 2018-02-05 DIAGNOSIS — D631 Anemia in chronic kidney disease: Secondary | ICD-10-CM | POA: Diagnosis not present

## 2018-02-05 DIAGNOSIS — R059 Cough, unspecified: Secondary | ICD-10-CM

## 2018-02-05 MED ORDER — SALINE NASAL SPRAY 0.65 % NA SOLN: 1.0000 | NASAL | 1 refills | Status: DC | PRN

## 2018-02-05 MED ORDER — BENZONATATE 100 MG PO CAPS: 100.0000 mg | ORAL_CAPSULE | Freq: Three times a day (TID) | ORAL | 0 refills | Status: DC | PRN

## 2018-02-05 MED ORDER — AMOXICILLIN-POT CLAVULANATE 875-125 MG PO TABS: 1.0000 | ORAL_TABLET | Freq: Two times a day (BID) | ORAL | 0 refills | Status: DC

## 2018-02-05 NOTE — Progress Notes (Signed)
Subjective:    Patient ID: Jeremy [name], male    DOB: 02/26/1956, 62 y.o.   MRN: [id number]  HPI   Patient presents to clinic with sinus congestion, pressure, drainage down the back of throat for the past 2 weeks.  States he thought it got a little bit better for a day, but then drainage persisted.  States he will cough more so at night due to the drainage running down the back of throat.  Denies fever or chills.  Denies shortness of breath or wheezing.  Denies nausea, vomiting or diarrhea.   Patient Active Problem List   Diagnosis Date Noted  . Cut of lower leg 11/17/2017  . Depression, major, single episode, severe ([place]) 05/17/2017  . AV fistula ([place]) 05/17/2017  . Chronic pain of both knees 05/17/2017  . Arthralgia 05/17/2017  . Rash 05/17/2017  . Osteoarthritis 03/17/2017  . 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 Atrial fibrillation ([place]) [I48.91] 10/30/2016  . Ventral hernia 10/14/2016  . History of renal cell carcinoma 08/04/2016  . AKI (acute kidney injury) ([place]) 10/20/2015  . Microhematuria 10/01/2015  . Hypogonadism in male 07/25/2015  . 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 Gout 04/24/2015  . Visit for biopsy 01/03/2015  . Proteinuria 01/03/2015  . Chronic anticoagulation 11/06/2014  . Iron deficiency anemia 11/02/2014  . Rectal bleeding 11/02/2014  . Incisional hernia, without obstruction or gangrene 01/30/2014  . 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[place]) 08/16/2013  . Spinal stenosis 06/20/2013  . OSA (obstructive sleep apnea)   . Edema 02/22/2013  . Immunosuppression ([place]) 02/22/2013  . History of kidney transplant 12/15/2012  . Immunosuppressive management encounter following kidney transplant 12/15/2012  . ESRD on hemodialysis ([place]) 12/05/2012  . S/P angioplasty with stent 09/07/2012  . Lumbar radiculopathy 09/06/2012  . ESRD on dialysis ([place]) 07/26/2012  . Hyperlipidemia 07/26/2012  . S/P AVR (aortic valve replacement) 06/23/2012  .  Aortic valve disorder 06/14/2012  . Heart failure ([place]) 05/30/2012  . Thyroid nodule 06/26/2011  . CAD (coronary artery disease) 05/26/2011  . Essential hypertension 05/19/2011   Social History   Tobacco Use  . Smoking status: Never Smoker  . Smokeless tobacco: Never Used  Substance Use Topics  . Alcohol use: No   Review of Systems  Constitutional: Negative for chills, fatigue and fever.  HENT: +congestion, sinus pain/drainage.    Eyes: Negative.   Respiratory: +cough. Negative for shortness of breath and wheezing.   Cardiovascular: Negative for chest pain, palpitations and leg swelling.  Gastrointestinal: Negative for abdominal pain, diarrhea, nausea and vomiting.  Genitourinary: Negative for dysuria, frequency and urgency.  Musculoskeletal: Negative for arthralgias and myalgias.  Skin: Negative for color change, pallor and rash.  Neurological: Negative for syncope, light-headedness and headaches.  Psychiatric/Behavioral: The patient is not nervous/anxious.       Objective:   Physical Exam  Constitutional: He is oriented to person, place, and time. No distress.  HENT:  Head: Normocephalic and atraumatic.  Nose: Mucosal edema and rhinorrhea present. Right sinus exhibits frontal sinus tenderness. Left sinus exhibits frontal sinus tenderness.  +post nasal drip   Eyes: Conjunctivae and EOM are normal. No scleral icterus.  Neck: Neck supple. No tracheal deviation present.  Cardiovascular: Normal rate.  Rhythm irreg, known persistant Afib  Pulmonary/Chest: Effort normal and breath sounds normal. No respiratory distress. He has no wheezes. He has no rales.  Musculoskeletal: He exhibits no edema.  Gait normal.   Neurological: He is alert and oriented to person,  place, and time.  Skin: Skin is warm and dry. He is not diaphoretic. No pallor.  Psychiatric: He has a normal mood and affect. His behavior is normal.  Nursing note and vitals reviewed.     Vitals:   02/05/18 1357    BP: 132/76  Pulse: 97  Temp: 99 F (37.2 C)  SpO2: 98%   Assessment & Plan:   Acute sinusitis, Postnasal drip, Cough - patient will take Augmentin twice daily for 10 days.  Patient is on Coumadin and does have upcoming INR appointment scheduled.  Patient is aware that antibiotics can affect Coumadin effectiveness at certain doses and needs to be checked after recent antibiotic.  Patient will also use saline nasal spray to help open up with nasal congestion and help improve postnasal drip.  Patient will take [facility] as needed for cough.  Lungs are clear on exam, so I suspect cough is related to postnasal drip.  Keep regularly scheduled follow-up with PCP as planned.  Return to clinic sooner if any issues arise.

## 2018-02-08 ENCOUNTER — Ambulatory Visit (INDEPENDENT_AMBULATORY_CARE_PROVIDER_SITE_OTHER): Payer: Medicare Other | Admitting: Pharmacist

## 2018-02-08 DIAGNOSIS — I4891 Unspecified atrial fibrillation: Secondary | ICD-10-CM

## 2018-02-08 DIAGNOSIS — M7989 Other specified soft tissue disorders: Secondary | ICD-10-CM | POA: Diagnosis not present

## 2018-02-08 DIAGNOSIS — E663 Overweight: Secondary | ICD-10-CM | POA: Diagnosis not present

## 2018-02-08 DIAGNOSIS — M150 Primary generalized (osteo)arthritis: Secondary | ICD-10-CM | POA: Diagnosis not present

## 2018-02-08 DIAGNOSIS — M1990 Unspecified osteoarthritis, unspecified site: Secondary | ICD-10-CM | POA: Diagnosis not present

## 2018-02-08 DIAGNOSIS — D631 Anemia in chronic kidney disease: Secondary | ICD-10-CM | POA: Diagnosis not present

## 2018-02-08 DIAGNOSIS — M8580 Other specified disorders of bone density and structure, unspecified site: Secondary | ICD-10-CM | POA: Diagnosis not present

## 2018-02-08 DIAGNOSIS — M25431 Effusion, right wrist: Secondary | ICD-10-CM | POA: Diagnosis not present

## 2018-02-08 DIAGNOSIS — Z952 Presence of prosthetic heart valve: Secondary | ICD-10-CM | POA: Diagnosis not present

## 2018-02-08 DIAGNOSIS — M109 Gout, unspecified: Secondary | ICD-10-CM | POA: Diagnosis not present

## 2018-02-08 DIAGNOSIS — M810 Age-related osteoporosis without current pathological fracture: Secondary | ICD-10-CM | POA: Diagnosis not present

## 2018-02-08 DIAGNOSIS — E790 Hyperuricemia without signs of inflammatory arthritis and tophaceous disease: Secondary | ICD-10-CM | POA: Diagnosis not present

## 2018-02-08 DIAGNOSIS — M2550 Pain in unspecified joint: Secondary | ICD-10-CM | POA: Diagnosis not present

## 2018-02-08 DIAGNOSIS — N2581 Secondary hyperparathyroidism of renal origin: Secondary | ICD-10-CM | POA: Diagnosis not present

## 2018-02-08 DIAGNOSIS — Z6827 Body mass index (BMI) 27.0-27.9, adult: Secondary | ICD-10-CM | POA: Diagnosis not present

## 2018-02-08 DIAGNOSIS — N186 End stage renal disease: Secondary | ICD-10-CM | POA: Diagnosis not present

## 2018-02-08 LAB — POCT INR: INR: 5.7 — AB (ref 2.0–3.0)

## 2018-02-08 NOTE — Progress Notes (Signed)
I have reviewed the above note and agree. I was available to the pharmacist for consultation.  Tommi Rumps, MD

## 2018-02-09 ENCOUNTER — Telehealth: Payer: Self-pay

## 2018-02-09 NOTE — Telephone Encounter (Signed)
Jeremy [name] spoke with the patient yesterday.  I will forward to Lauren so she is aware.  There is a plan in place.

## 2018-02-09 NOTE — Telephone Encounter (Signed)
Plan in place to hold coumadin yesterday and today, begin taking Wednesday & repeat INR on 02/15/18

## 2018-02-09 NOTE — Telephone Encounter (Signed)
CRITICAL VALUE STICKER  MD INR Caryl Asp [phone] CRITICAL VALUE: INR 02/08/18 5.7 Patient just called 02/09/18 at 11:21 am and faxed to to office 11:24am   RECEIVER (on-site recipient of call):  [place] NOTIFIED: 02/09/18 11:28am  MESSENGER (representative from lab): MD INR Joy  MD NOTIFIED: Philis Nettle , NP   TIME OF NOTIFICATION: 11:29am   RESPONSE:    Looks like Barnetta Chapel , Pharmacist spoke with patient yesterday

## 2018-02-10 DIAGNOSIS — N186 End stage renal disease: Secondary | ICD-10-CM | POA: Diagnosis not present

## 2018-02-10 DIAGNOSIS — N2581 Secondary hyperparathyroidism of renal origin: Secondary | ICD-10-CM | POA: Diagnosis not present

## 2018-02-10 DIAGNOSIS — D631 Anemia in chronic kidney disease: Secondary | ICD-10-CM | POA: Diagnosis not present

## 2018-02-12 DIAGNOSIS — N186 End stage renal disease: Secondary | ICD-10-CM | POA: Diagnosis not present

## 2018-02-12 DIAGNOSIS — N2581 Secondary hyperparathyroidism of renal origin: Secondary | ICD-10-CM | POA: Diagnosis not present

## 2018-02-12 DIAGNOSIS — D631 Anemia in chronic kidney disease: Secondary | ICD-10-CM | POA: Diagnosis not present

## 2018-02-15 ENCOUNTER — Telehealth: Payer: Self-pay

## 2018-02-15 ENCOUNTER — Ambulatory Visit (INDEPENDENT_AMBULATORY_CARE_PROVIDER_SITE_OTHER): Payer: Medicare Other | Admitting: Pharmacist

## 2018-02-15 DIAGNOSIS — N186 End stage renal disease: Secondary | ICD-10-CM | POA: Diagnosis not present

## 2018-02-15 DIAGNOSIS — D631 Anemia in chronic kidney disease: Secondary | ICD-10-CM | POA: Diagnosis not present

## 2018-02-15 DIAGNOSIS — Z7901 Long term (current) use of anticoagulants: Secondary | ICD-10-CM | POA: Diagnosis not present

## 2018-02-15 DIAGNOSIS — Z952 Presence of prosthetic heart valve: Secondary | ICD-10-CM

## 2018-02-15 DIAGNOSIS — I4891 Unspecified atrial fibrillation: Secondary | ICD-10-CM

## 2018-02-15 DIAGNOSIS — N2581 Secondary hyperparathyroidism of renal origin: Secondary | ICD-10-CM | POA: Diagnosis not present

## 2018-02-15 LAB — POCT INR: INR: 1.9 — AB (ref 2.0–3.0)

## 2018-02-15 NOTE — Telephone Encounter (Signed)
Sent to PCP ?

## 2018-02-15 NOTE — Telephone Encounter (Signed)
Jeremy [name] spoke with patient and plan is documented in anticoagulation visit.

## 2018-02-15 NOTE — Telephone Encounter (Signed)
Jeremy [name] with MD INR called & gave out of range results. Patient was tested with a 1.9 INR today. Please advise?

## 2018-02-17 DIAGNOSIS — D631 Anemia in chronic kidney disease: Secondary | ICD-10-CM | POA: Diagnosis not present

## 2018-02-17 DIAGNOSIS — N2581 Secondary hyperparathyroidism of renal origin: Secondary | ICD-10-CM | POA: Diagnosis not present

## 2018-02-17 DIAGNOSIS — N186 End stage renal disease: Secondary | ICD-10-CM | POA: Diagnosis not present

## 2018-02-18 NOTE — Progress Notes (Signed)
I have reviewed the above note and agree. I was available to the pharmacist for consultation.  Tommi Rumps, MD

## 2018-02-19 DIAGNOSIS — N2581 Secondary hyperparathyroidism of renal origin: Secondary | ICD-10-CM | POA: Diagnosis not present

## 2018-02-19 DIAGNOSIS — D631 Anemia in chronic kidney disease: Secondary | ICD-10-CM | POA: Diagnosis not present

## 2018-02-19 DIAGNOSIS — N186 End stage renal disease: Secondary | ICD-10-CM | POA: Diagnosis not present

## 2018-02-22 ENCOUNTER — Ambulatory Visit (INDEPENDENT_AMBULATORY_CARE_PROVIDER_SITE_OTHER): Payer: Medicare Other | Admitting: Pharmacist

## 2018-02-22 ENCOUNTER — Telehealth: Payer: Self-pay | Admitting: Pharmacist

## 2018-02-22 DIAGNOSIS — I4891 Unspecified atrial fibrillation: Secondary | ICD-10-CM | POA: Diagnosis not present

## 2018-02-22 DIAGNOSIS — N186 End stage renal disease: Secondary | ICD-10-CM | POA: Diagnosis not present

## 2018-02-22 DIAGNOSIS — D631 Anemia in chronic kidney disease: Secondary | ICD-10-CM | POA: Diagnosis not present

## 2018-02-22 DIAGNOSIS — Z7901 Long term (current) use of anticoagulants: Secondary | ICD-10-CM

## 2018-02-22 DIAGNOSIS — Z952 Presence of prosthetic heart valve: Secondary | ICD-10-CM

## 2018-02-22 DIAGNOSIS — N2581 Secondary hyperparathyroidism of renal origin: Secondary | ICD-10-CM | POA: Diagnosis not present

## 2018-02-22 LAB — POCT INR: INR: 2.6 (ref 2.0–3.0)

## 2018-02-22 NOTE — Telephone Encounter (Signed)
Opened in error

## 2018-02-22 NOTE — Progress Notes (Signed)
I have reviewed the above note and agree. I was available to the pharmacist for consultation.  Tommi Rumps, MD

## 2018-02-24 DIAGNOSIS — N186 End stage renal disease: Secondary | ICD-10-CM | POA: Diagnosis not present

## 2018-02-24 DIAGNOSIS — N2581 Secondary hyperparathyroidism of renal origin: Secondary | ICD-10-CM | POA: Diagnosis not present

## 2018-02-24 DIAGNOSIS — D631 Anemia in chronic kidney disease: Secondary | ICD-10-CM | POA: Diagnosis not present

## 2018-02-26 DIAGNOSIS — D631 Anemia in chronic kidney disease: Secondary | ICD-10-CM | POA: Diagnosis not present

## 2018-02-26 DIAGNOSIS — N2581 Secondary hyperparathyroidism of renal origin: Secondary | ICD-10-CM | POA: Diagnosis not present

## 2018-02-26 DIAGNOSIS — N186 End stage renal disease: Secondary | ICD-10-CM | POA: Diagnosis not present

## 2018-02-28 DIAGNOSIS — N2581 Secondary hyperparathyroidism of renal origin: Secondary | ICD-10-CM | POA: Diagnosis not present

## 2018-02-28 DIAGNOSIS — N186 End stage renal disease: Secondary | ICD-10-CM | POA: Diagnosis not present

## 2018-02-28 DIAGNOSIS — D631 Anemia in chronic kidney disease: Secondary | ICD-10-CM | POA: Diagnosis not present

## 2018-03-01 ENCOUNTER — Ambulatory Visit (INDEPENDENT_AMBULATORY_CARE_PROVIDER_SITE_OTHER): Payer: Medicare Other | Admitting: Pharmacist

## 2018-03-01 DIAGNOSIS — Z7901 Long term (current) use of anticoagulants: Secondary | ICD-10-CM | POA: Diagnosis not present

## 2018-03-01 DIAGNOSIS — I4891 Unspecified atrial fibrillation: Secondary | ICD-10-CM | POA: Diagnosis not present

## 2018-03-01 DIAGNOSIS — Z952 Presence of prosthetic heart valve: Secondary | ICD-10-CM

## 2018-03-01 DIAGNOSIS — I4821 Permanent atrial fibrillation: Secondary | ICD-10-CM | POA: Diagnosis not present

## 2018-03-01 LAB — POCT INR: INR: 2.8 (ref 2.0–3.0)

## 2018-03-01 NOTE — Progress Notes (Signed)
I have reviewed the above note and agree. I was available to the pharmacist for consultation.  Tommi Rumps, MD

## 2018-03-02 DIAGNOSIS — D631 Anemia in chronic kidney disease: Secondary | ICD-10-CM | POA: Diagnosis not present

## 2018-03-02 DIAGNOSIS — N2581 Secondary hyperparathyroidism of renal origin: Secondary | ICD-10-CM | POA: Diagnosis not present

## 2018-03-02 DIAGNOSIS — N186 End stage renal disease: Secondary | ICD-10-CM | POA: Diagnosis not present

## 2018-03-05 DIAGNOSIS — N2581 Secondary hyperparathyroidism of renal origin: Secondary | ICD-10-CM | POA: Diagnosis not present

## 2018-03-05 DIAGNOSIS — N186 End stage renal disease: Secondary | ICD-10-CM | POA: Diagnosis not present

## 2018-03-05 DIAGNOSIS — D631 Anemia in chronic kidney disease: Secondary | ICD-10-CM | POA: Diagnosis not present

## 2018-03-06 DIAGNOSIS — N186 End stage renal disease: Secondary | ICD-10-CM | POA: Diagnosis not present

## 2018-03-06 DIAGNOSIS — Z992 Dependence on renal dialysis: Secondary | ICD-10-CM | POA: Diagnosis not present

## 2018-03-06 DIAGNOSIS — T8610 Unspecified complication of kidney transplant: Secondary | ICD-10-CM | POA: Diagnosis not present

## 2018-03-08 ENCOUNTER — Ambulatory Visit (INDEPENDENT_AMBULATORY_CARE_PROVIDER_SITE_OTHER): Payer: Medicare Other | Admitting: Pharmacist

## 2018-03-08 DIAGNOSIS — I4891 Unspecified atrial fibrillation: Secondary | ICD-10-CM

## 2018-03-08 DIAGNOSIS — D631 Anemia in chronic kidney disease: Secondary | ICD-10-CM | POA: Diagnosis not present

## 2018-03-08 DIAGNOSIS — Z7901 Long term (current) use of anticoagulants: Secondary | ICD-10-CM

## 2018-03-08 DIAGNOSIS — N186 End stage renal disease: Secondary | ICD-10-CM | POA: Diagnosis not present

## 2018-03-08 DIAGNOSIS — Z952 Presence of prosthetic heart valve: Secondary | ICD-10-CM

## 2018-03-08 DIAGNOSIS — N2581 Secondary hyperparathyroidism of renal origin: Secondary | ICD-10-CM | POA: Diagnosis not present

## 2018-03-08 LAB — POCT INR: INR: 3.4 — AB (ref 2.0–3.0)

## 2018-03-08 NOTE — Progress Notes (Signed)
I have reviewed the above note and agree. I was available to the pharmacist for consultation.  Tommi Rumps, MD

## 2018-03-09 ENCOUNTER — Encounter: Payer: Self-pay | Admitting: Internal Medicine

## 2018-03-09 ENCOUNTER — Ambulatory Visit (INDEPENDENT_AMBULATORY_CARE_PROVIDER_SITE_OTHER): Payer: Medicare Other | Admitting: Internal Medicine

## 2018-03-09 VITALS — BP 128/74 | HR 104 | Ht 70.0 in | Wt 192.0 lb

## 2018-03-09 DIAGNOSIS — G4733 Obstructive sleep apnea (adult) (pediatric): Secondary | ICD-10-CM | POA: Diagnosis not present

## 2018-03-09 DIAGNOSIS — J309 Allergic rhinitis, unspecified: Secondary | ICD-10-CM

## 2018-03-09 DIAGNOSIS — I2510 Atherosclerotic heart disease of native coronary artery without angina pectoris: Secondary | ICD-10-CM

## 2018-03-09 DIAGNOSIS — J849 Interstitial pulmonary disease, unspecified: Secondary | ICD-10-CM | POA: Diagnosis not present

## 2018-03-09 NOTE — Patient Instructions (Addendum)
Follow up with CT chest in 1 year   Continue Claritin as prescribed   I will need compliance report at next office visit for sleep apnea   Follow up with cardiologist  Follow up with Nephrologist

## 2018-03-09 NOTE — Progress Notes (Signed)
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 Transplant #1 lasted approximately 9 years Transplant #2 in 2014 lasted about 3 years  HISTORY OF PRESENT ILLNESS:  Patient here for 52-month follow-up for shortness of breath Follow-up for CT chest results  At this time patient does not have any acute respiratory symptom  has intermittent chronic shortness of breath No signs of infection at this time  No signs of heart failure Patient states that he had a stress test that was abnormal that was performed at [facility] He was told he has a weak heart and needs to see his cardiologist at [facility] Patient has mechanical aortic valve on Coumadin therapy  I have reviewed the CT chest results with the patient There is some lower lobe predominant interstitial lung disease When compared to previous CT scan of the abdomen pelvis approximately 1.5 years ago There was some subtle lower lobe interstitial lung disease that was noted  Overall I do feel that the CT chest has some interstitial lung disease but is not overwhelming in conjunction with patient's respiratory symptoms  Patient had URI symptoms approximately several weeks ago was given amoxicillin and Mucinex and that cleared it up  PAST MEDICAL HISTORY :   has a past medical history of Anemia in chronic kidney disease (07/04/2015), Arthritis, Chronic anticoagulation (06/2012), Dialysis patient [facility]) (01 01 2008), Dysrhythmia, colonoscopy, Hyperlipidemia, Hypertension, Hypogonadism in male (07/25/2015), Kidney disease, OSA (obstructive sleep apnea), and Renal cell carcinoma ([place]) (08/04/2016).  has a past surgical history that includes DG AV DIALYSIS  SHUNT ACCESS EXIST*L* OR; Back surgery;  Coronary angioplasty with stent; Kidney transplant; Aortic valve replacement (3 /11 /2014); TEE without cardioversion (N/A, 07/21/2016); Hernia repair; Eye surgery; Inner ear surgery; epigastric hernia repair (N/A, 02/19/2017); CAPD insertion (N/A, 02/19/2017); Hernia repair (02/19/2017); Peritoneal Dialysis access placement (02/19/2017); and Removal of a dialysis catheter (N/A, 11/23/2017). Prior to Admission medications   Medication Sig Start Date End Date Taking? Authorizing Provider  acetaminophen (TYLENOL) 500 MG tablet Take 1,000 mg 2 (two) times daily as needed by mouth for moderate pain.   Yes [provider]  allopurinol (ZYLOPRIM) 100 MG tablet Take 2 tablets (200 mg total) by mouth daily. 05/31/17  Yes Leone [place], MD  cinacalcet (SENSIPAR) 60 MG tablet Take 30 mg by mouth daily. At dialysis   Yes [provider]  Dextrose-Fructose-Sod Citrate (NAUZENE PO) Take 1-2 tablets 2 (two) times daily as needed by mouth (nausea).   Yes [provider]  dicyclomine (BENTYL) 10 MG capsule Take 2 capsules (20 mg total) by mouth 3 (three) times daily before meals. 11/14/15  Yes Setzer, Terri L, NP  DiphenhydrAMINE HCl, Sleep, 50 MG CAPS Take 50 mg at bedtime as needed by mouth (sleep).   Yes [provider]  FLUoxetine (PROZAC) 10 MG capsule TAKE 1 CAPSULE BY MOUTH EVERY DAY 10/29/17  Yes Leone [place], MD  HYDROcodone-acetaminophen (NORCO) 5-325 MG tablet Take 1 tablet by mouth every 6 (six) hours as needed for moderate pain. 11/23/17  Yes Dew, Erskine Squibb, MD  lidocaine-prilocaine (EMLA) cream Apply 1 application as needed topically (port access).   Yes [provider]  metoprolol tartrate (LOPRESSOR) 100 MG tablet Take 0.5 tablets (50 mg total)  by mouth 2 (two) times daily. 11/17/17  Yes Leone Haven, MD  Multiple Vitamin (MULTIVITAMIN WITH MINERALS) TABS tablet Take 1 tablet by mouth daily.   Yes [provider]  nitroGLYCERIN (NITROSTAT)  0.4 MG SL tablet Place 1 tablet (0.4 mg total) under the tongue every 5 (five) minutes as needed for chest pain. 04/09/17  Yes Leone Haven, MD  Omega-3 Fatty Acids (FISH OIL) 1200 MG CAPS Take 1,200 mg 2 (two) times daily by mouth.   Yes [provider]  omeprazole (PRILOSEC) 20 MG capsule Take 1 capsule (20 mg total) by mouth daily. 05/31/17  Yes Leone [place], MD  pravastatin (PRAVACHOL) 10 MG tablet TAKE ONE TABLET BY MOUTH ON MONDAY, [facility], AND FRIDAY 10/29/17  Yes Leone [place], MD  predniSONE (DELTASONE) 10 MG tablet Take 2 tabs (20mg ) daily through 7/15, then 1 & 1/2 tabs (15mg ) daily through 7/29, then 1 tab (10mg ) daily through 8/12, then 1/2 tab (5mg ) daily through 8/26, then stop 10/12/16  Yes [provider]  warfarin (COUMADIN) 1 MG tablet Take 1 tablet (1 mg total) by mouth daily. 05/31/17  Yes Leone [place], MD   Allergies  Allergen Reactions  . Statins     Other reaction(s): Arthralgias (intolerance), Myalgias (intolerance)  . Sertraline Rash      Review of Systems:  Gen:  Denies  fever, sweats, chills weigh loss  HEENT: Denies blurred vision, double vision, ear pain, eye pain, hearing loss, nose bleeds, sore throat Cardiac:  No dizziness, chest pain or heaviness, chest tightness,edema, No JVD Resp:   No cough, -sputum production, -shortness of breath,-wheezing, -hemoptysis,  Gi: Denies swallowing difficulty, stomach pain, nausea or vomiting, diarrhea, constipation, bowel incontinence Gu:  Denies bladder incontinence, burning urine Ext:   Denies Joint pain, stiffness or swelling Skin: Denies  skin rash, easy bruising or bleeding or hives Endoc:  Denies polyuria, polydipsia , polyphagia or weight change Psych:   Denies depression, insomnia or hallucinations  Other:  All other systems negative     Ht 5\' 10"  (1.778 m)   BMI 27.38 kg/m  BP 128/74 (BP Location: Right Arm, Cuff Size: Normal)   Pulse (!) 104   Ht 5\' 10"  (1.778 m)    Wt 192 lb (87.1 kg)   SpO2 100%   BMI 27.55 kg/m   Physical Examination:   GENERAL:NAD, no fevers, chills, no weakness no fatigue HEAD: Normocephalic, atraumatic.  EYES: Pupils equal, round, reactive to light. Extraocular muscles intact. No scleral icterus.  MOUTH: Moist mucosal membrane. Dentition intact. No abscess noted.  EAR, NOSE, THROAT: Clear without exudates. No external lesions.  NECK: Supple. No thyromegaly. No nodules. No JVD.  PULMONARY: CTA B/L no wheezing, rhonchi, crackles CARDIOVASCULAR: S1 and S2. IR- Regular rate and rhythm. No murmurs, rubs, or gallops. No edema. Pedal pulses 2+ bilaterally.  GASTROINTESTINAL: Soft, nontender, nondistended. No masses. Positive bowel sounds. No hepatosplenomegaly.  MUSCULOSKELETAL: L arm fistula with bruit and thrill NEUROLOGIC: Cranial nerves II through XII are intact. No gross focal neurological deficits. Sensation intact. Reflexes intact.  SKIN: No ulceration, lesions, rashes, or cyanosis. Skin warm and dry. Turgor intact.  PSYCHIATRIC: Mood, affect within normal limits. The patient is awake, alert and oriented x 3. Insight, judgment intact.  ALL OTHER ROS ARE NEGATIVE            ASSESSMENT / PLAN: 62 year old pleasant white male seen today for follow-up for abnormal CT chest with bilateral interstitial lung disease of the lower  lobes which is chronic based on CT scans over the last several years Patient denies any acute respiratory symptoms at this time and he has chronic intermittent shortness of breath with extreme exertion but overall his respiratory symptoms are not compromised  Patient has underlying significant cardiac history with aortic valve replacement with A. fib on Coumadin therapy in conjunction with chronic kidney disease and end-stage renal disease on dialysis  At this time patient does have an abnormal stress test which could be contributing to his chronic shortness of breath along with his intermittent A.  Fib Recommend that he follow along as scheduled with his cardiologist  I have reviewed the CT chest results with the patient and the plan is to repeat CT chest in 1 year for interval assessment however he is to notify us if there is any progression or decompensation of his respiratory symptoms  Patient has also underlying obstructive sleep apnea I will need a compliance report at next office visit and he will need to be plugged into the system We will follow his sleep apnea      Patient  satisfied with Plan of action and management. All questions answered  Corrin Parker, M.D.  [facility]  Medical Director [place] Director [facility] Cardio-Pulmonary Department

## 2018-03-10 DIAGNOSIS — N186 End stage renal disease: Secondary | ICD-10-CM | POA: Diagnosis not present

## 2018-03-10 DIAGNOSIS — N2581 Secondary hyperparathyroidism of renal origin: Secondary | ICD-10-CM | POA: Diagnosis not present

## 2018-03-10 DIAGNOSIS — D631 Anemia in chronic kidney disease: Secondary | ICD-10-CM | POA: Diagnosis not present

## 2018-03-12 ENCOUNTER — Other Ambulatory Visit: Payer: Self-pay | Admitting: Family Medicine

## 2018-03-12 DIAGNOSIS — D631 Anemia in chronic kidney disease: Secondary | ICD-10-CM | POA: Diagnosis not present

## 2018-03-12 DIAGNOSIS — N2581 Secondary hyperparathyroidism of renal origin: Secondary | ICD-10-CM | POA: Diagnosis not present

## 2018-03-12 DIAGNOSIS — N186 End stage renal disease: Secondary | ICD-10-CM | POA: Diagnosis not present

## 2018-03-15 DIAGNOSIS — N186 End stage renal disease: Secondary | ICD-10-CM | POA: Diagnosis not present

## 2018-03-15 DIAGNOSIS — N2581 Secondary hyperparathyroidism of renal origin: Secondary | ICD-10-CM | POA: Diagnosis not present

## 2018-03-15 DIAGNOSIS — D631 Anemia in chronic kidney disease: Secondary | ICD-10-CM | POA: Diagnosis not present

## 2018-03-17 DIAGNOSIS — N186 End stage renal disease: Secondary | ICD-10-CM | POA: Diagnosis not present

## 2018-03-17 DIAGNOSIS — D631 Anemia in chronic kidney disease: Secondary | ICD-10-CM | POA: Diagnosis not present

## 2018-03-17 DIAGNOSIS — N2581 Secondary hyperparathyroidism of renal origin: Secondary | ICD-10-CM | POA: Diagnosis not present

## 2018-03-17 LAB — POCT INR: INR: 2.8 (ref 2.0–3.0)

## 2018-03-18 ENCOUNTER — Ambulatory Visit (INDEPENDENT_AMBULATORY_CARE_PROVIDER_SITE_OTHER): Payer: Medicare Other | Admitting: Pharmacist

## 2018-03-18 DIAGNOSIS — Z7901 Long term (current) use of anticoagulants: Secondary | ICD-10-CM

## 2018-03-18 DIAGNOSIS — I4891 Unspecified atrial fibrillation: Secondary | ICD-10-CM | POA: Diagnosis not present

## 2018-03-18 DIAGNOSIS — Z952 Presence of prosthetic heart valve: Secondary | ICD-10-CM

## 2018-03-18 NOTE — Progress Notes (Signed)
I have reviewed the above note and agree.  Ronan Duecker, M.D.  

## 2018-03-19 ENCOUNTER — Ambulatory Visit (INDEPENDENT_AMBULATORY_CARE_PROVIDER_SITE_OTHER): Payer: Medicare Other

## 2018-03-19 ENCOUNTER — Ambulatory Visit (INDEPENDENT_AMBULATORY_CARE_PROVIDER_SITE_OTHER): Payer: Medicare Other | Admitting: Family Medicine

## 2018-03-19 ENCOUNTER — Encounter: Payer: Self-pay | Admitting: Family Medicine

## 2018-03-19 VITALS — BP 128/68 | HR 70 | Temp 98.2°F | Ht 69.0 in | Wt 190.1 lb

## 2018-03-19 VITALS — BP 128/68 | HR 70 | Temp 98.2°F | Resp 16 | Ht 69.0 in | Wt 190.1 lb

## 2018-03-19 DIAGNOSIS — J069 Acute upper respiratory infection, unspecified: Secondary | ICD-10-CM

## 2018-03-19 DIAGNOSIS — K580 Irritable bowel syndrome with diarrhea: Secondary | ICD-10-CM | POA: Diagnosis not present

## 2018-03-19 DIAGNOSIS — I2510 Atherosclerotic heart disease of native coronary artery without angina pectoris: Secondary | ICD-10-CM | POA: Diagnosis not present

## 2018-03-19 DIAGNOSIS — R9439 Abnormal result of other cardiovascular function study: Secondary | ICD-10-CM

## 2018-03-19 DIAGNOSIS — Z0000 Encounter for general adult medical examination without abnormal findings: Secondary | ICD-10-CM

## 2018-03-19 DIAGNOSIS — I509 Heart failure, unspecified: Secondary | ICD-10-CM | POA: Diagnosis not present

## 2018-03-19 DIAGNOSIS — I359 Nonrheumatic aortic valve disorder, unspecified: Secondary | ICD-10-CM

## 2018-03-19 DIAGNOSIS — T148XXA Other injury of unspecified body region, initial encounter: Secondary | ICD-10-CM

## 2018-03-19 DIAGNOSIS — N2581 Secondary hyperparathyroidism of renal origin: Secondary | ICD-10-CM | POA: Diagnosis not present

## 2018-03-19 DIAGNOSIS — N186 End stage renal disease: Secondary | ICD-10-CM | POA: Diagnosis not present

## 2018-03-19 DIAGNOSIS — K589 Irritable bowel syndrome without diarrhea: Secondary | ICD-10-CM | POA: Insufficient documentation

## 2018-03-19 DIAGNOSIS — D631 Anemia in chronic kidney disease: Secondary | ICD-10-CM | POA: Diagnosis not present

## 2018-03-19 DIAGNOSIS — F325 Major depressive disorder, single episode, in full remission: Secondary | ICD-10-CM | POA: Diagnosis not present

## 2018-03-19 DIAGNOSIS — I4891 Unspecified atrial fibrillation: Secondary | ICD-10-CM

## 2018-03-19 HISTORY — DX: Abnormal result of other cardiovascular function study: R94.39

## 2018-03-19 NOTE — Patient Instructions (Signed)
Nice to see you. We will get you to see cardiology locally and GI locally.  Please keep your appointment with cardiology at [facility]. If you develop chest pain or you develop progressive dyspnea please go to the emergency room. Please monitor the pole muscle in your left flank.  If this does not improve please let us know.

## 2018-03-19 NOTE — Progress Notes (Signed)
Subjective:   Jeremy [name] is a 62 y.o. male who presents for Medicare Annual/Subsequent preventive examination.  Review of Systems:  No ROS.  Medicare Wellness Visit. Additional risk factors are reflected in the social history. Cardiac Risk Factors include: advanced age (>40men, >47 women);hypertension;male gender     Objective:    Vitals: BP 128/68 (BP Location: Right Arm, Patient Position: Sitting, Cuff Size: Normal)   Pulse 70   Temp 98.2 F (36.8 C) (Oral)   Resp 16   Ht 5\' 9"  (1.753 m)   Wt 190 lb 1.9 oz (86.2 kg)   SpO2 98%   BMI 28.08 kg/m   Body mass index is 28.08 kg/m.  Advanced Directives 03/19/2018 05/06/2017 04/08/2017 03/10/2017 02/19/2017 02/17/2017 02/12/2017  Does Patient Have a Medical Advance Directive? No No No No No No No  Would patient like information on creating a medical advance directive? No - Patient declined No - Patient declined No - Patient declined No - Patient declined No - Patient declined No - Patient declined No - Patient declined    Tobacco Social History   Tobacco Use  Smoking Status Never Smoker  Smokeless Tobacco Never Used     Counseling given: Not Answered   Clinical Intake:  Pre-visit preparation completed: Yes        Diabetes: No  How often do you need to have someone help you when you read instructions, pamphlets, or other written materials from your doctor or pharmacy?: 1 - Never  Interpreter Needed?: No     Past Medical History:  Diagnosis Date  . 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 Arthritis   . Chronic anticoagulation 06/2012  . Dialysis patient [facility]) 01 01 2008  . Dysrhythmia    A Fib  . Hx of colonoscopy    2009 by Dr. Laural Golden. Normal except for small submucosal lipoma. No evidence of polyps or colitis.  . Hyperlipidemia   . Hypertension   . Hypogonadism in male 07/25/2015  . 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 Kidney transplant in  2000 and renal cell carcinoma and transplanted kidney removed along with his own diseased kidneys.  Marland Kitchen OSA (obstructive sleep apnea)    No CPAP  . Renal cell carcinoma ([place]) 08/04/2016   Past Surgical History:  Procedure Laterality Date  . AORTIC VALVE REPLACEMENT  3 /11 /2014   At [place]    . CAPD INSERTION N/A 02/19/2017   Procedure: LAPAROSCOPIC INSERTION CONTINUOUS AMBULATORY PERITONEAL DIALYSIS  (CAPD) CATHETER;  Surgeon: Algernon Huxley, MD;  Location: [facility] ORS;  Service: General;  Laterality: N/A;  . CORONARY ANGIOPLASTY WITH STENT PLACEMENT    . DG AV DIALYSIS  SHUNT ACCESS EXIST*L* OR     left arm  . EPIGASTRIC HERNIA REPAIR N/A 02/19/2017   Procedure: HERNIA REPAIR EPIGASTRIC ADULT;  Surgeon: Robert Bellow, MD;  Location: [facility] ORS;  Service: General;  Laterality: N/A;  . EYE SURGERY     bilateral cataract  . HERNIA REPAIR     UMBILICAL HERNIA  . HERNIA REPAIR  02/19/2017  . INNER EAR SURGERY     20 years ago  . KIDNEY TRANSPLANT    . Peritoneal Dialysis access placement  02/19/2017  . REMOVAL OF A DIALYSIS CATHETER N/A 11/23/2017   Procedure: REMOVAL OF A PERITONEAL DIALYSIS CATHETER;  Surgeon: Algernon Huxley, MD;  Location: [facility] ORS;  Service: Vascular;  Laterality: N/A;  . TEE WITHOUT CARDIOVERSION N/A 07/21/2016   Procedure: TRANSESOPHAGEAL ECHOCARDIOGRAM (TEE);  Surgeon: Wellington Hampshire, MD;  Location: [facility] ORS;  Service: Cardiovascular;  Laterality: N/A;   Family History  Problem Relation Age of Onset  . Cancer Mother        pancreatic  . Heart disease Father   . Diabetes Father    Social History   Socioeconomic History  . Marital status: Married    Spouse name: Not on file  . Number of children: Not on file  . Years of education: Not on file  . Highest education level: Not on file  Occupational History  . Occupation: courier    Comment: [employer]  . Financial resource strain: Not hard  at all  . Food insecurity:    Worry: Never true    Inability: Never true  . Transportation needs:    Medical: No    Non-medical: No  Tobacco Use  . Smoking status: Never Smoker  . Smokeless tobacco: Never Used  Substance and Sexual Activity  . Alcohol use: No  . Drug use: No  . Sexual activity: Not Currently    Partners: Female  Lifestyle  . Physical activity:    Days per week: 0 days    Minutes per session: Not on file  . Stress: Not on file  Relationships  . Social connections:    Talks on phone: Not on file    Gets together: Not on file    Attends religious service: Not on file    Active member of club or organization: Not on file    Attends meetings of clubs or organizations: Not on file    Relationship status: Not on file  Other Topics Concern  . Not on file  Social History Narrative   Single   1 daughter 26     Outpatient Encounter Medications as of 03/19/2018  Medication Sig  . acetaminophen (TYLENOL) 500 MG tablet Take 1,000 mg 2 (two) times daily as needed by mouth for moderate pain.  Marland Kitchen allopurinol (ZYLOPRIM) 300 MG tablet Take 300 mg by mouth daily.  Marland Kitchen amoxicillin-clavulanate (AUGMENTIN) 875-125 MG tablet Take 1 tablet by mouth 2 (two) times daily.  . cinacalcet (SENSIPAR) 30 MG tablet Take 30 mg by mouth daily. At dialysis  . denosumab (PROLIA) 60 MG/ML SOSY injection Inject 60 mg into the skin every 6 (six) months.  . Dextrose-Fructose-Sod Citrate (NAUZENE PO) Take 1-2 tablets 2 (two) times daily as needed by mouth (nausea).  . DiphenhydrAMINE HCl, Sleep, 50 MG CAPS Take 50 mg at bedtime as needed by mouth (sleep).  Marland Kitchen FLUoxetine (PROZAC) 10 MG capsule TAKE 1 CAPSULE BY MOUTH EVERY DAY  . hydroxychloroquine (PLAQUENIL) 200 MG tablet Take 400 mg by mouth at bedtime.  . lidocaine-prilocaine (EMLA) cream Apply 1 application as needed topically (port access).  . metoprolol tartrate (LOPRESSOR) 100 MG tablet Take 0.5 tablets (50 mg total) by mouth 2 (two) times  daily. (Patient taking differently: Take 50 mg by mouth daily. )  . Multiple Vitamin (MULTIVITAMIN WITH MINERALS) TABS tablet Take 1 tablet by mouth daily.  . multivitamin (RENA-VIT) TABS tablet Take 1 tablet by mouth daily.  . nitroGLYCERIN (NITROSTAT) 0.4 MG SL tablet Place 1 tablet (0.4 mg total) under the tongue every 5 (five) minutes as needed for chest pain.  . Omega-3 Fatty Acids (FISH OIL) 1200 MG CAPS Take 1,200 mg 2 (two) times daily by mouth.  Marland Kitchen  pravastatin (PRAVACHOL) 10 MG tablet TAKE ONE TABLET BY MOUTH ON MONDAY, WEDNESDAY, AND FRIDAY  . predniSONE (DELTASONE) 5 MG tablet 5 mg.   . sodium chloride (AFRIN SALINE NASAL MIST) 0.65 % nasal spray Place 1 spray into the nose as needed for congestion.  Marland Kitchen warfarin (COUMADIN) 1 MG tablet Take 1 tablet (1 mg total) by mouth daily.  . [DISCONTINUED] allopurinol (ZYLOPRIM) 100 MG tablet Take 2 tablets (200 mg total) by mouth daily.  . [DISCONTINUED] benzonatate (TESSALON) 100 MG capsule Take 1 capsule (100 mg total) by mouth 3 (three) times daily as needed for cough.  . dicyclomine (BENTYL) 10 MG capsule Take 2 capsules (20 mg total) by mouth 3 (three) times daily before meals. (Patient not taking: Reported on 03/19/2018)  . HYDROcodone-acetaminophen (NORCO) 5-325 MG tablet Take 1 tablet by mouth every 6 (six) hours as needed for moderate pain. (Patient not taking: Reported on 03/19/2018)   No facility-administered encounter medications on file as of 03/19/2018.     Activities of Daily Living In your present state of health, do you have any difficulty performing the following activities: 03/19/2018  Hearing? N  Vision? N  Difficulty concentrating or making decisions? N  Walking or climbing stairs? Y  Comment Notes difficulty walking long distances.   Dressing or bathing? N  Doing errands, shopping? N  Preparing Food and eating ? N  Using the Toilet? N  In the past six months, have you accidently leaked urine? N  Do you have problems  with loss of bowel control? N  Managing your Medications? N  Managing your Finances? N  Housekeeping or managing your Housekeeping? N  Some recent data might be hidden    Patient Care Team: Leone Haven, MD as PCP - General (Family Medicine) Warden Fillers, MD as Consulting Physician (Ophthalmology) Whitney Muse, Kelby Fam, MD (Inactive) as Consulting Physician (Hematology and Oncology) Lucky Cowboy Erskine Squibb, MD as Referring Physician (Vascular Surgery) Robert Bellow, MD (General Surgery)   Assessment:   This is a routine wellness examination for [place].  32 ounce fluid restriction Care everywhere-cardiology report   Health Screenings  Colonoscopy-11/27/14 PSA-09/29/17 Bone Density-12/14/17 Glaucoma-none reported. Hearing-Hx of R eardrum damage. Followed.  Vision-09/2017 Dental-every 6 months  Social  Alcohol intake-none Smoking history- none Smokers in home?none Illicit drug use?none Exercise-none.  Will try to walk 3x week.  Diet- Heart healthy Sexually Active-not currently Multiple Partners-none  Safety  Patient feesl safe at home.  Patient does have smoke detectors at home  Patient does wear sunscreen or protective clothing when in direct sunlight  Patient does wear seat belt when driving or riding with others.   Activities of Daily Living Patient can do their own household chores. Denies needing assistance with: driving, feeding themselves, getting from bed to chair, getting to the toilet, bathing/showering, dressing, managing money, climbing flight of stairs, or preparing meals.   Depression Screen Patient denies losing interest in daily life, feeling hopeless, or crying easily over simple problems.   Fall Screen Patient denies being afraid of falling or falling in the last year.    Memory Screen Patient denies problems with memory, misplacing items, and is able to balance checkbook/bank accounts.  Patient is alert, normal appearance, oriented to  person/place/and time. Correctly identified the president of the [place], recall of 3 objects, and performing simple calculations.  Patient displays appropriate judgement and can read correct time from watch face.   Immunizations The following Immunizations are up to date: shingles, pneumonia, and tetanus.  Other Providers Patient Care Team: Leone Haven, MD as PCP - General (Family Medicine) Warden Fillers, MD as Consulting Physician (Ophthalmology) Whitney Muse, Kelby Fam, MD (Inactive) as Consulting Physician (Hematology and Oncology) Lucky Cowboy Erskine Squibb, MD as Referring Physician (Vascular Surgery) Bary Castilla Forest Gleason, MD (General Surgery)  Exercise Activities and Dietary recommendations Current Exercise Habits: The patient does not participate in regular exercise at present  Goals    . Exercise 3x per week (30 min per time)     Try to walk for 30 minutes 3 times a week as tolerated       Fall Risk Fall Risk  03/19/2018 05/20/2016 03/25/2016 11/23/2015 09/28/2015  Falls in the past year? 0 No No No No   Depression Screen PHQ 2/9 Scores 03/19/2018 05/17/2017 08/08/2016 05/20/2016  PHQ - 2 Score 0 6 1 0  PHQ- 9 Score - 25 2 -    Cognitive Function MMSE - Mini Mental State Exam 03/25/2016  Orientation to time 5  Orientation to Place 5  Registration 3  Attention/ Calculation 5  Recall 3  Language- name 2 objects 2  Language- repeat 1  Language- follow 3 step command 3  Language- read & follow direction 1  Write a sentence 1  Copy design 1  Total score 30     6CIT Screen 03/19/2018  What Year? 0 points  What month? 0 points  What time? 0 points  Count back from 20 0 points  Months in reverse 0 points  Repeat phrase 0 points  Total Score 0    Immunization History  Administered Date(s) Administered  . Influenza-Unspecified 04/20/2013, 01/09/2014, 01/04/2015, 12/25/2015  . Td 04/20/2006   Screening Tests Health Maintenance  Topic Date Due  . INFLUENZA VACCINE   11/05/2017  . TETANUS/TDAP  04/08/2019  . COLONOSCOPY  11/26/2024  . Hepatitis C Screening  Completed  . HIV Screening  Completed      Plan:    End of life planning; Advanced aging; Advanced directives discussed.  No HCPOA/Living Will.  Additional information declined at this time.  I have personally reviewed and noted the following in the patient's chart:   . Medical and social history . Use of alcohol, tobacco or illicit drugs  . Current medications and supplements . Functional ability and status . Nutritional status . Physical activity . Advanced directives . List of other physicians . Hospitalizations, surgeries, and ER visits in previous 12 months . Vitals . Screenings to include cognitive, depression, and falls . Referrals and appointments  In addition, I have reviewed and discussed with patient certain preventive protocols, quality metrics, and best practice recommendations. A written personalized care plan for preventive services as well as general preventive health recommendations were provided to patient.     Varney Biles, LPN  [id number]

## 2018-03-19 NOTE — Assessment & Plan Note (Signed)
Refer to GI 

## 2018-03-19 NOTE — Assessment & Plan Note (Signed)
Asymptomatic.  Continue current medication.

## 2018-03-19 NOTE — Assessment & Plan Note (Signed)
I suspect his left side pain is related to a muscle strain.  He will monitor and if not improving let us know.

## 2018-03-19 NOTE — Progress Notes (Signed)
Jeremy Rumps, MD Phone: [phone]  Jeremy [name] is a 62 y.o. male who presents today for follow-up.  CC: Depression, A. fib, abnormal stress test, IBS, pulled muscle, right ear issue  Depression: Patient denies any depressive symptoms.  No SI.  He continues on Prozac.  A. fib: Patient does note he can feel his heart rate go up at times.  No bleeding issues.  He is on Coumadin.    Patient had a stress test about a month ago as part of his work-up for kidney transplant.  This revealed an abnormal high risk study.  This revealed a large area consistent with scar.  His EF was calculated to be 30%.  He has noted dyspnea on exertion for at least the last 6 months.  It has been stable over that period of time and has not worsened.  He notes with about 5 to 10 minutes of exertion he needs to rest.  He has follow-up with his cardiologist and he believes it is in January.  IBS: Patient notes diarrhea predominant IBS.  He notes food goes straight through him after eating.  He follows with GI and has been on Bentyl though it is not beneficial.  He would like to see GI locally.  Muscle strain: Patient notes yesterday he noted some discomfort over his left lower side that occurs when he gets up from seated position or when walking.  He feels it is a muscle strain.  It is tender to palpation.  Tylenol was not beneficial.  Right ear issue: Patient notes he had an upper respiratory illness.  He notes that has improved though he has a popping and bubbling sensation in his right ear.  No drainage.  He is taking Claritin.  Social History   Tobacco Use  Smoking Status Never Smoker  Smokeless Tobacco Never Used     ROS see history of present illness  Objective  Physical Exam Vitals:   03/19/18 1410  BP: 128/68  Pulse: 70  Temp: 98.2 F (36.8 C)  SpO2: 98%    BP Readings from Last 3 Encounters:  03/19/18 128/68  03/19/18 128/68  03/09/18 128/74   Wt Readings from Last 3 Encounters:    03/19/18 190 lb 1.9 oz (86.2 kg)  03/19/18 190 lb 1.9 oz (86.2 kg)  03/09/18 192 lb (87.1 kg)    Physical Exam Constitutional:      General: He is not in acute distress.    Appearance: He is not diaphoretic.  HENT:     Left Ear: Tympanic membrane normal.     Ears:     Comments: Apparent scar of right TM, small amount of clear fluid with bubbles noted, no purulence Cardiovascular:     Rate and Rhythm: Normal rate and regular rhythm.     Heart sounds: Normal heart sounds.  Pulmonary:     Effort: Pulmonary effort is normal.     Breath sounds: Normal breath sounds.  Abdominal:     General: Bowel sounds are normal. There is no distension.     Palpations: Abdomen is soft.     Tenderness: There is no abdominal tenderness.  Musculoskeletal:       Legs:  Skin:    General: Skin is warm and dry.  Neurological:     Mental Status: He is alert.      Assessment/Plan: Please see individual problem list.  Atrial fibrillation ([place]) [I48.91] Rate controlled.  He will see cardiology.  We will see if we can get  his cardiology appointment moved up.  Abnormal stress test Abnormal stress test noted.  He does have dyspnea on exertion that has been stable over the last 6 months.  We will see if we can get his cardiology appointment moved up.  Given return precautions.  Depression Asymptomatic.  Continue current medication.  URI (upper respiratory infection) Symptoms have improved.  He does have some residual right ear symptoms that are likely related to eustachian tube dysfunction.  He will continue Claritin.  IBS (irritable bowel syndrome) Refer to GI.  Muscle strain I suspect his left side pain is related to a muscle strain.  He will monitor and if not improving let us know.   Orders Placed This Encounter  Procedures  . Ambulatory referral to Cardiology    Referral Priority:   Routine    Referral Type:   Consultation    Referral Reason:   Specialty Services Required     Requested Specialty:   Cardiology    Number of Visits Requested:   1  . Ambulatory referral to Gastroenterology    Referral Priority:   Routine    Referral Type:   Consultation    Referral Reason:   Specialty Services Required    Number of Visits Requested:   1    No orders of the defined types were placed in this encounter.    Jeremy Rumps, MD [place]

## 2018-03-19 NOTE — Assessment & Plan Note (Signed)
Rate controlled.  He will see cardiology.  We will see if we can get his cardiology appointment moved up.

## 2018-03-19 NOTE — Assessment & Plan Note (Signed)
Abnormal stress test noted.  He does have dyspnea on exertion that has been stable over the last 6 months.  We will see if we can get his cardiology appointment moved up.  Given return precautions.

## 2018-03-19 NOTE — Assessment & Plan Note (Signed)
Symptoms have improved.  He does have some residual right ear symptoms that are likely related to eustachian tube dysfunction.  He will continue Claritin.

## 2018-03-19 NOTE — Patient Instructions (Signed)
  Mr. Bayona , Thank you for taking time to come for your Medicare Wellness Visit. I appreciate your ongoing commitment to your health goals. Please review the following plan we discussed and let me know if I can assist you in the future.   These are the goals we discussed: Goals    . Exercise 3x per week (30 min per time)     Try to walk for 30 minutes 3 times a week as tolerated       This is a list of the screening recommended for you and due dates:  Health Maintenance  Topic Date Due  . Flu Shot  11/05/2017  . Tetanus Vaccine  04/08/2019  . Colon Cancer Screening  11/26/2024  .  Hepatitis C: One time screening is recommended by Center for Disease Control  (CDC) for  adults born from [birth year] through 1964.   Completed  . HIV Screening  Completed

## 2018-03-22 ENCOUNTER — Other Ambulatory Visit: Payer: Self-pay

## 2018-03-22 DIAGNOSIS — N186 End stage renal disease: Secondary | ICD-10-CM | POA: Diagnosis not present

## 2018-03-22 DIAGNOSIS — N2581 Secondary hyperparathyroidism of renal origin: Secondary | ICD-10-CM | POA: Diagnosis not present

## 2018-03-22 DIAGNOSIS — D631 Anemia in chronic kidney disease: Secondary | ICD-10-CM | POA: Diagnosis not present

## 2018-03-22 MED ORDER — PRAVASTATIN SODIUM 10 MG PO TABS: ORAL_TABLET | ORAL | 1 refills | Status: DC

## 2018-03-23 ENCOUNTER — Telehealth: Payer: Self-pay | Admitting: Family Medicine

## 2018-03-23 NOTE — Progress Notes (Signed)
I have reviewed the above note and agree.  Curtistine Pettitt, M.D.  

## 2018-03-23 NOTE — Telephone Encounter (Signed)
Noted. The patient should increase his Wednesday dose to 2 mg for tomorrow and then resume his normal regimen after that. He should call us next Monday with his weekly INR result. I sent my note to Melissa to see if she could reach out to his cardiologist to get him in sooner. Please check with her ot see if she has seen the message. Thanks.

## 2018-03-23 NOTE — Telephone Encounter (Signed)
Patient INR = 2.4 yesterday, patient current  Coumadin regimen Monday 2 mg, Tues 2 mg, Wed 1 mg, Thurs 2 mg, Fri 1 mg, Sat 2 mg, Sun 1 mg.  Patient also ask if you were able to get that cardiology appointment moved un from 05/11/18 with his old cardiologist. His new cardiologist appointment is 05/27/18.

## 2018-03-23 NOTE — Telephone Encounter (Signed)
Patient notified and voiced understanding  With repeat of directions.

## 2018-03-23 NOTE — Telephone Encounter (Signed)
Can you call the patient and see what his INR was yesterday? He checks this at home. Thanks.

## 2018-03-24 ENCOUNTER — Other Ambulatory Visit: Payer: Self-pay

## 2018-03-24 DIAGNOSIS — N186 End stage renal disease: Secondary | ICD-10-CM | POA: Diagnosis not present

## 2018-03-24 DIAGNOSIS — N2581 Secondary hyperparathyroidism of renal origin: Secondary | ICD-10-CM | POA: Diagnosis not present

## 2018-03-24 DIAGNOSIS — D631 Anemia in chronic kidney disease: Secondary | ICD-10-CM | POA: Diagnosis not present

## 2018-03-25 ENCOUNTER — Ambulatory Visit (INDEPENDENT_AMBULATORY_CARE_PROVIDER_SITE_OTHER): Payer: Medicare Other | Admitting: Gastroenterology

## 2018-03-25 ENCOUNTER — Other Ambulatory Visit: Payer: Self-pay

## 2018-03-25 ENCOUNTER — Encounter: Payer: Self-pay | Admitting: Gastroenterology

## 2018-03-25 VITALS — BP 152/67 | HR 87 | Resp 17 | Ht 69.0 in | Wt 191.6 lb

## 2018-03-25 DIAGNOSIS — K580 Irritable bowel syndrome with diarrhea: Secondary | ICD-10-CM

## 2018-03-25 DIAGNOSIS — K529 Noninfective gastroenteritis and colitis, unspecified: Secondary | ICD-10-CM | POA: Diagnosis not present

## 2018-03-25 NOTE — Progress Notes (Signed)
Jeremy Darby, MD [address]  [place], [place] [address]  Main: [phone]  Fax: [phone]    Gastroenterology Consultation  Referring Provider:     Leone Haven, MD Primary Care Physician:  Leone Haven, MD Primary Gastroenterologist:  Dr. Cephas [name] Reason for Consultation:     ?IBS-D        HPI:   Jeremy [name] is a 62 y.o. Caucasian male referred by Dr. Caryl Bis, Angela Adam, MD  for consultation & management of IBS-D. Patient has past medical history significant for A. 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 Had 2 kidney transplants which failed.  He reports more than 5 years history of GI symptoms which include post prandial diarrhea, urgency, bloating, cramps, gas. Imodium works transiently. Bentyl TID and QHS did not work. GI symptoms resolved on peritoneal dialysis but it was stopped as it was suboptimal.  Switched to hemodialysis and his GI symptoms recurred on HD.  Patient underwent colonoscopy which was first colon cancer screening in 2015 it was unremarkable.  Mucosa appeared normal, biopsies were not performed.  He also reports undergoing EGD and reportedly normal.  Patient denies weight loss.  He denies rectal bleeding. He drinks sweetened tea daily He does not smoke or drink alcohol NSAIDs: None  Antiplts/Anticoagulants/Anti thrombotics: Coumadin for A. fib  GI Procedures: EGD and colonoscopy in 2015, normal Mother with pancreatic cancer at age 85  Past Medical History:  Diagnosis Date  . 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 Arthritis   . Chronic anticoagulation 06/2012  . Dialysis patient [facility]) 01 01 2008  . Dysrhythmia    A Fib  . Hx of colonoscopy    2009 by Dr. Laural Golden. Normal except for small submucosal lipoma. No evidence of polyps or colitis.  . Hyperlipidemia   . Hypertension   . Hypogonadism in male 07/25/2015  . Kidney disease    Chronic kidney disease secondary  to IgA nephropathy diagnosed when he was 10. 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  Marland Kitchen OSA (obstructive sleep apnea)    No CPAP  . Renal cell carcinoma ([place]) 08/04/2016    Past Surgical History:  Procedure Laterality Date  . AORTIC VALVE REPLACEMENT  3 /11 /2014   At [place]    . CAPD INSERTION N/A 02/19/2017   Procedure: LAPAROSCOPIC INSERTION CONTINUOUS AMBULATORY PERITONEAL DIALYSIS  (CAPD) CATHETER;  Surgeon: Algernon Huxley, MD;  Location: [facility] ORS;  Service: General;  Laterality: N/A;  . CORONARY ANGIOPLASTY WITH STENT PLACEMENT    . DG AV DIALYSIS  SHUNT ACCESS EXIST*L* OR     left arm  . EPIGASTRIC HERNIA REPAIR N/A 02/19/2017   Procedure: HERNIA REPAIR EPIGASTRIC ADULT;  Surgeon: Robert Bellow, MD;  Location: [facility] ORS;  Service: General;  Laterality: N/A;  . EYE SURGERY     bilateral cataract  . HERNIA REPAIR     UMBILICAL HERNIA  . HERNIA REPAIR  02/19/2017  . INNER EAR SURGERY     20 years ago  . KIDNEY TRANSPLANT    . Peritoneal Dialysis access placement  02/19/2017  . REMOVAL OF A DIALYSIS CATHETER N/A 11/23/2017   Procedure: REMOVAL OF A PERITONEAL DIALYSIS CATHETER;  Surgeon: Algernon Huxley, MD;  Location: [facility] ORS;  Service: Vascular;  Laterality: N/A;  .  TEE WITHOUT CARDIOVERSION N/A 07/21/2016   Procedure: TRANSESOPHAGEAL ECHOCARDIOGRAM (TEE);  Surgeon: Wellington Hampshire, MD;  Location: [facility] ORS;  Service: Cardiovascular;  Laterality: N/A;    Current Outpatient Medications:  .  acetaminophen (TYLENOL) 500 MG tablet, Take 1,000 mg 2 (two) times daily as needed by mouth for moderate pain., Disp: , Rfl:  .  allopurinol (ZYLOPRIM) 300 MG tablet, Take 300 mg by mouth daily., Disp: , Rfl: 5 .  cinacalcet (SENSIPAR) 30 MG tablet, Take 30 mg by mouth daily. At dialysis, Disp: , Rfl:  .  denosumab (PROLIA) 60 MG/ML SOSY injection, Inject 60 mg into  the skin every 6 (six) months., Disp: , Rfl:  .  Dextrose-Fructose-Sod Citrate (NAUZENE PO), Take 1-2 tablets 2 (two) times daily as needed by mouth (nausea)., Disp: , Rfl:  .  DiphenhydrAMINE HCl, Sleep, 50 MG CAPS, Take 50 mg at bedtime as needed by mouth (sleep)., Disp: , Rfl:  .  FLUoxetine (PROZAC) 10 MG capsule, TAKE 1 CAPSULE BY MOUTH EVERY DAY, Disp: 90 capsule, Rfl: 0 .  HYDROcodone-acetaminophen (NORCO) 5-325 MG tablet, Take 1 tablet by mouth every 6 (six) hours as needed for moderate pain., Disp: 30 tablet, Rfl: 0 .  hydroxychloroquine (PLAQUENIL) 200 MG tablet, Take 400 mg by mouth at bedtime., Disp: , Rfl:  .  lidocaine-prilocaine (EMLA) cream, Apply 1 application as needed topically (port access)., Disp: , Rfl:  .  metoprolol tartrate (LOPRESSOR) 100 MG tablet, Take 0.5 tablets (50 mg total) by mouth 2 (two) times daily. (Patient taking differently: Take 50 mg by mouth daily. ), Disp: 90 tablet, Rfl: 3 .  Multiple Vitamin (MULTIVITAMIN WITH MINERALS) TABS tablet, Take 1 tablet by mouth daily., Disp: , Rfl:  .  nitroGLYCERIN (NITROSTAT) 0.4 MG SL tablet, Place 1 tablet (0.4 mg total) under the tongue every 5 (five) minutes as needed for chest pain., Disp: 25 tablet, Rfl: 2 .  Omega-3 Fatty Acids (FISH OIL) 1200 MG CAPS, Take 1,200 mg 2 (two) times daily by mouth., Disp: , Rfl:  .  pravastatin (PRAVACHOL) 10 MG tablet, Take one tablet by mouth on Monday, Wednesday, and Friday, Disp: 36 tablet, Rfl: 1 .  predniSONE (DELTASONE) 5 MG tablet, 5 mg. , Disp: , Rfl:  .  warfarin (COUMADIN) 1 MG tablet, Take 1 tablet (1 mg total) by mouth daily., Disp: 180 tablet, Rfl: 1 .  sodium chloride (AFRIN SALINE NASAL MIST) 0.65 % nasal spray, Place 1 spray into the nose as needed for congestion. (Patient not taking: Reported on 03/25/2018), Disp: 30 mL, Rfl: 1   Family History  Problem Relation Age of Onset  . Cancer Mother        pancreatic  . Heart disease Father   . Diabetes Father       Social History   Tobacco Use  . Smoking status: Never Smoker  . Smokeless tobacco: Never Used  Substance Use Topics  . Alcohol use: No  . Drug use: No    Allergies as of 03/25/2018 - Review Complete 03/25/2018  Allergen Reaction Noted  . Statins  10/02/2016  . Sertraline Rash 10/20/2015    Review of Systems:    All systems reviewed and negative except where noted in HPI.   Physical Exam:  BP (!) 152/67 (BP Location: Left Arm, Patient Position: Sitting, Cuff Size: Large)   Pulse 87   Resp 17   Ht 5\' 9"  (1.753 m)   Wt 191 lb 9.6 oz (86.9 kg)   BMI 28.29  kg/m  No LMP for male patient.  General:   Alert,  Well-developed, well-nourished, pleasant and cooperative in NAD Head:  Normocephalic and atraumatic. Eyes:  Sclera clear, no icterus.   Conjunctiva pink. Ears:  Normal auditory acuity. Nose:  No deformity, discharge, or lesions. Mouth:  No deformity or lesions,oropharynx pink & moist. Neck:  Supple; no masses or thyromegaly. Lungs:  Respirations even and unlabored.  Clear throughout to auscultation.   No wheezes, crackles, or rhonchi. No acute distress. Heart:  Regular rate and rhythm; no murmurs, clicks, rubs, or gallops. Abdomen:  Normal bowel sounds. Soft, non-tender and diffusely moderately distended, tympanic to percussion without masses, hepatosplenomegaly or hernias noted.  Scar from prior kidney transplants, no guarding or rebound tenderness.   Rectal: Not performed Msk:  Symmetrical without gross deformities. Good, equal movement & strength bilaterally. Pulses:  Normal pulses noted. Extremities:  No clubbing or edema.  No cyanosis, left forearm AV fistula with palpable bruit. Neurologic:  Alert and oriented x3;  grossly normal neurologically. Skin:  Intact without significant lesions or rashes. No jaundice. Lymph Nodes:  No significant cervical adenopathy. Psych:  Alert and cooperative. Normal mood and affect.  Imaging Studies: Reviewed  Assessment and  Plan:   ALLIN FRIX is a 62 y.o. Caucasian male with A. fib on Coumadin, end-stage renal disease secondary to IgA nephropathy status post kidney transplant x2 in the past, peritoneal dialysis followed by hemodialysis with 5 years history of postprandial nonbloody diarrhea associated with abdominal bloating, cramps without any weight loss  My recommendations are Check pancreatic fecal elastase Check fecal calprotectin, total IgA, TTG IgA Avoid artificial sweeteners, sweetened tea, cut back on red meat Trial of probiotics, samples for VSL #3 given Flexible sigmoidoscopy with biopsies to evaluate for microscopic colitis  Follow up in 4 to 6 weeks   Jeremy Darby, MD

## 2018-03-26 DIAGNOSIS — D631 Anemia in chronic kidney disease: Secondary | ICD-10-CM | POA: Diagnosis not present

## 2018-03-26 DIAGNOSIS — N186 End stage renal disease: Secondary | ICD-10-CM | POA: Diagnosis not present

## 2018-03-26 DIAGNOSIS — N2581 Secondary hyperparathyroidism of renal origin: Secondary | ICD-10-CM | POA: Diagnosis not present

## 2018-03-26 LAB — TISSUE TRANSGLUTAMINASE, IGA: Transglutaminase IgA: <2 U/mL (ref 0–3)

## 2018-03-26 LAB — IGA: IgA/Immunoglobulin A, Serum: 340 mg/dL (ref 61–437)

## 2018-03-28 DIAGNOSIS — N186 End stage renal disease: Secondary | ICD-10-CM | POA: Diagnosis not present

## 2018-03-28 DIAGNOSIS — D631 Anemia in chronic kidney disease: Secondary | ICD-10-CM | POA: Diagnosis not present

## 2018-03-28 DIAGNOSIS — N2581 Secondary hyperparathyroidism of renal origin: Secondary | ICD-10-CM | POA: Diagnosis not present

## 2018-03-29 ENCOUNTER — Telehealth: Payer: Self-pay | Admitting: Family Medicine

## 2018-03-29 DIAGNOSIS — K580 Irritable bowel syndrome with diarrhea: Secondary | ICD-10-CM | POA: Diagnosis not present

## 2018-03-29 DIAGNOSIS — K529 Noninfective gastroenteritis and colitis, unspecified: Secondary | ICD-10-CM | POA: Diagnosis not present

## 2018-03-29 DIAGNOSIS — I4821 Permanent atrial fibrillation: Secondary | ICD-10-CM | POA: Diagnosis not present

## 2018-03-29 NOTE — Telephone Encounter (Signed)
Continue same dose INR normal between 2-3   TMS

## 2018-03-29 NOTE — Telephone Encounter (Signed)
Patient calling to obtain lab results. Nurse triage currently unavailable. Please advise.

## 2018-03-29 NOTE — Telephone Encounter (Signed)
Called and spoke with patient. Pt advised and voiced understanding.  

## 2018-03-29 NOTE — Telephone Encounter (Signed)
Pt. Called with INR results; 2.2 today.  Stated he started taking a Probiotic last week, and took a dose Friday, Saturday, and Sunday.  Stated he was not sure if this would affect his INR.  Has not been eating any greens.  Has been on the following dose of Coumadin: M,T,W,Th 2 mg; Fri. 1 mg; Sat. 2mg ; Sun. 1mg .  Advised will report this information to Dr. Caryl Bis.

## 2018-03-29 NOTE — Telephone Encounter (Signed)
Called patient and left a VM to call back. CRM created and sent to PEC pool.  

## 2018-03-30 ENCOUNTER — Telehealth: Payer: Self-pay | Admitting: Internal Medicine

## 2018-03-30 DIAGNOSIS — N2581 Secondary hyperparathyroidism of renal origin: Secondary | ICD-10-CM | POA: Diagnosis not present

## 2018-03-30 DIAGNOSIS — D631 Anemia in chronic kidney disease: Secondary | ICD-10-CM | POA: Diagnosis not present

## 2018-03-30 DIAGNOSIS — N186 End stage renal disease: Secondary | ICD-10-CM | POA: Diagnosis not present

## 2018-03-30 NOTE — Telephone Encounter (Signed)
-----   Message from De Hollingshead, [facility] sent at 03/30/2018 11:11 AM EST ----- Dr. Terese Door,   Thank you for evaluating [name] INR yesterday, as I was on vacation.   Since September 2018, Dr. Cook/Dr. Caryl Bis have been treating Mr. [name] with an INR goal of 2.5-3.5 due to his high risk multiple conditions (mechanical aortic valve plus development of atrial fibrillation earlier that year). However, his INR remains very fluctuant due being on dialysis, as well as frequent prednisone bursts. I previously had him on 2 mg M, T, Th, Sat and 1 W, F, Sun - it looks like he was barely subtherapeutic last week at 2.4, and Dr. Caryl Bis did a small boost and asked him to take 2 on Wed. This patient is pretty slow to steady state, so we could see it start to trend from that, especially if he also takes 2 mg this upcoming Wednesday.   I just wanted to reach out and see if you wanted me to make any further modifications to his regimen this week, given the 2.5-3.5 goal, or just hold the course until I talk to him next Monday.   Thanks so much! Hope you're having a great holiday season.  Catie Darnelle Maffucci, PharmD PGY2 Ambulatory Care Pharmacy Resident, [place] [facility] Phone: [phone]

## 2018-03-30 NOTE — Telephone Encounter (Signed)
Inform pt of this please INR goal per PCP and former PCP 2.5-3.5 so INR was lower than this recently  Please call the patient and see what he is doing with dose of coumadin currently and make adjustments if not at goal to goal 2.5-3.5   Will route to PCP  Thank you for the update   [place]

## 2018-04-01 LAB — CALPROTECTIN, FECAL: Calprotectin, Fecal: 43 ug/g (ref 0–120)

## 2018-04-01 NOTE — Telephone Encounter (Signed)
Contacted patient. Left HIPAA compliant message for him to return my call at his convenience.   Catie Darnelle Maffucci, PharmD PGY2 Ambulatory Care Pharmacy Resident, [place] [facility] Phone: [phone]

## 2018-04-02 DIAGNOSIS — N186 End stage renal disease: Secondary | ICD-10-CM | POA: Diagnosis not present

## 2018-04-02 DIAGNOSIS — D631 Anemia in chronic kidney disease: Secondary | ICD-10-CM | POA: Diagnosis not present

## 2018-04-02 DIAGNOSIS — N2581 Secondary hyperparathyroidism of renal origin: Secondary | ICD-10-CM | POA: Diagnosis not present

## 2018-04-04 DIAGNOSIS — D631 Anemia in chronic kidney disease: Secondary | ICD-10-CM | POA: Diagnosis not present

## 2018-04-04 DIAGNOSIS — N186 End stage renal disease: Secondary | ICD-10-CM | POA: Diagnosis not present

## 2018-04-04 DIAGNOSIS — N2581 Secondary hyperparathyroidism of renal origin: Secondary | ICD-10-CM | POA: Diagnosis not present

## 2018-04-05 ENCOUNTER — Ambulatory Visit (INDEPENDENT_AMBULATORY_CARE_PROVIDER_SITE_OTHER): Payer: Medicare Other | Admitting: Pharmacist

## 2018-04-05 DIAGNOSIS — Z952 Presence of prosthetic heart valve: Secondary | ICD-10-CM

## 2018-04-05 DIAGNOSIS — Z7901 Long term (current) use of anticoagulants: Secondary | ICD-10-CM

## 2018-04-05 DIAGNOSIS — I4891 Unspecified atrial fibrillation: Secondary | ICD-10-CM

## 2018-04-05 LAB — POCT INR: INR: 2.7 (ref 2.0–3.0)

## 2018-04-05 LAB — PANCREATIC ELASTASE, FECAL: Pancreatic Elastase, Fecal: 403 ug Elast./g (ref >200)

## 2018-04-06 DIAGNOSIS — N186 End stage renal disease: Secondary | ICD-10-CM | POA: Diagnosis not present

## 2018-04-06 DIAGNOSIS — D631 Anemia in chronic kidney disease: Secondary | ICD-10-CM | POA: Diagnosis not present

## 2018-04-06 DIAGNOSIS — N2581 Secondary hyperparathyroidism of renal origin: Secondary | ICD-10-CM | POA: Diagnosis not present

## 2018-04-06 DIAGNOSIS — Z992 Dependence on renal dialysis: Secondary | ICD-10-CM | POA: Diagnosis not present

## 2018-04-06 DIAGNOSIS — T8610 Unspecified complication of kidney transplant: Secondary | ICD-10-CM | POA: Diagnosis not present

## 2018-04-06 NOTE — Progress Notes (Signed)
I have reviewed the above note and agree. I was available to the pharmacist for consultation.  Tommi Rumps, MD

## 2018-04-09 DIAGNOSIS — N186 End stage renal disease: Secondary | ICD-10-CM | POA: Diagnosis not present

## 2018-04-09 DIAGNOSIS — N2581 Secondary hyperparathyroidism of renal origin: Secondary | ICD-10-CM | POA: Diagnosis not present

## 2018-04-12 ENCOUNTER — Telehealth: Payer: Self-pay | Admitting: Gastroenterology

## 2018-04-12 ENCOUNTER — Ambulatory Visit (INDEPENDENT_AMBULATORY_CARE_PROVIDER_SITE_OTHER): Payer: Medicare Other | Admitting: Pharmacist

## 2018-04-12 DIAGNOSIS — I4891 Unspecified atrial fibrillation: Secondary | ICD-10-CM

## 2018-04-12 DIAGNOSIS — Z7901 Long term (current) use of anticoagulants: Secondary | ICD-10-CM

## 2018-04-12 DIAGNOSIS — N186 End stage renal disease: Secondary | ICD-10-CM | POA: Diagnosis not present

## 2018-04-12 DIAGNOSIS — Z952 Presence of prosthetic heart valve: Secondary | ICD-10-CM

## 2018-04-12 DIAGNOSIS — N2581 Secondary hyperparathyroidism of renal origin: Secondary | ICD-10-CM | POA: Diagnosis not present

## 2018-04-12 LAB — POCT INR: INR: 3.1 — AB (ref 2.0–3.0)

## 2018-04-12 NOTE — Telephone Encounter (Signed)
Patient called stating he had spoke to Dr Verlin Grills nurse while at last office appointment wanting to r/s his procedure scheduled for 04-19-2018 in the endo unit. It is still showing in My Chart. Please call to reschedule.

## 2018-04-12 NOTE — Progress Notes (Signed)
Of note, patient states he believed that the sigmoidoscopy was being rescheduled; he plans to reach out to GI for confirmation  Catie Darnelle Maffucci, PharmD PGY2 Ambulatory Care Pharmacy Resident, [place] Phone: [phone]

## 2018-04-12 NOTE — Progress Notes (Signed)
I have reviewed the above note and agree. I was available to the pharmacist for consultation.  Tommi Rumps, MD

## 2018-04-13 ENCOUNTER — Encounter

## 2018-04-13 ENCOUNTER — Encounter (INDEPENDENT_AMBULATORY_CARE_PROVIDER_SITE_OTHER): Payer: Medicare Other

## 2018-04-13 ENCOUNTER — Ambulatory Visit (INDEPENDENT_AMBULATORY_CARE_PROVIDER_SITE_OTHER): Payer: Medicare Other | Admitting: Vascular Surgery

## 2018-04-13 DIAGNOSIS — Z7901 Long term (current) use of anticoagulants: Secondary | ICD-10-CM | POA: Diagnosis not present

## 2018-04-13 DIAGNOSIS — I350 Nonrheumatic aortic (valve) stenosis: Secondary | ICD-10-CM | POA: Diagnosis not present

## 2018-04-13 DIAGNOSIS — Z952 Presence of prosthetic heart valve: Secondary | ICD-10-CM | POA: Diagnosis not present

## 2018-04-13 DIAGNOSIS — I2510 Atherosclerotic heart disease of native coronary artery without angina pectoris: Secondary | ICD-10-CM | POA: Diagnosis not present

## 2018-04-13 DIAGNOSIS — R0609 Other forms of dyspnea: Secondary | ICD-10-CM | POA: Diagnosis not present

## 2018-04-13 DIAGNOSIS — I4891 Unspecified atrial fibrillation: Secondary | ICD-10-CM | POA: Diagnosis not present

## 2018-04-13 DIAGNOSIS — D649 Anemia, unspecified: Secondary | ICD-10-CM | POA: Diagnosis not present

## 2018-04-13 DIAGNOSIS — R0600 Dyspnea, unspecified: Secondary | ICD-10-CM | POA: Diagnosis not present

## 2018-04-13 DIAGNOSIS — I5020 Unspecified systolic (congestive) heart failure: Secondary | ICD-10-CM | POA: Diagnosis not present

## 2018-04-13 NOTE — Telephone Encounter (Signed)
Spoke with pt and procedure has been rescheduled from 04/19/18 to 04/29/18 Pt would like to know is there a medication that can be prescribed that is equivalent to VSL #3 due to he is unable to afford VSL, please advise

## 2018-04-14 DIAGNOSIS — N2581 Secondary hyperparathyroidism of renal origin: Secondary | ICD-10-CM | POA: Diagnosis not present

## 2018-04-14 DIAGNOSIS — N186 End stage renal disease: Secondary | ICD-10-CM | POA: Diagnosis not present

## 2018-04-16 ENCOUNTER — Other Ambulatory Visit: Payer: Self-pay | Admitting: Family Medicine

## 2018-04-16 DIAGNOSIS — N2581 Secondary hyperparathyroidism of renal origin: Secondary | ICD-10-CM | POA: Diagnosis not present

## 2018-04-16 DIAGNOSIS — N186 End stage renal disease: Secondary | ICD-10-CM | POA: Diagnosis not present

## 2018-04-19 ENCOUNTER — Other Ambulatory Visit: Payer: Self-pay | Admitting: Family Medicine

## 2018-04-19 ENCOUNTER — Ambulatory Visit (INDEPENDENT_AMBULATORY_CARE_PROVIDER_SITE_OTHER): Payer: Medicare Other | Admitting: Pharmacist

## 2018-04-19 DIAGNOSIS — N186 End stage renal disease: Secondary | ICD-10-CM | POA: Diagnosis not present

## 2018-04-19 DIAGNOSIS — I4891 Unspecified atrial fibrillation: Secondary | ICD-10-CM | POA: Diagnosis not present

## 2018-04-19 DIAGNOSIS — N2581 Secondary hyperparathyroidism of renal origin: Secondary | ICD-10-CM | POA: Diagnosis not present

## 2018-04-19 DIAGNOSIS — Z7901 Long term (current) use of anticoagulants: Secondary | ICD-10-CM | POA: Diagnosis not present

## 2018-04-19 DIAGNOSIS — Z952 Presence of prosthetic heart valve: Secondary | ICD-10-CM

## 2018-04-19 LAB — POCT INR: INR: 3.2 — AB (ref 2.0–3.0)

## 2018-04-19 NOTE — Telephone Encounter (Signed)
LVM asking pt to return my call concerning alternative to VSL #3

## 2018-04-21 DIAGNOSIS — N2581 Secondary hyperparathyroidism of renal origin: Secondary | ICD-10-CM | POA: Diagnosis not present

## 2018-04-21 DIAGNOSIS — N186 End stage renal disease: Secondary | ICD-10-CM | POA: Diagnosis not present

## 2018-04-21 NOTE — Progress Notes (Signed)
I have reviewed the above note and agree. I was available to the pharmacist for consultation.  Tommi Rumps, MD

## 2018-04-23 ENCOUNTER — Telehealth: Payer: Self-pay | Admitting: Pharmacist

## 2018-04-23 DIAGNOSIS — N2581 Secondary hyperparathyroidism of renal origin: Secondary | ICD-10-CM | POA: Diagnosis not present

## 2018-04-23 DIAGNOSIS — N186 End stage renal disease: Secondary | ICD-10-CM | POA: Diagnosis not present

## 2018-04-23 NOTE — Telephone Encounter (Signed)
Thank you for contacting those providers. I would advise that he needs clearance from his cardiologist for this procedure and they need to provide recommendations regarding his coumadin prior to him undergoing this procedure. Please let me know if you do not hear back from cardiology by Monday.

## 2018-04-23 NOTE — Telephone Encounter (Signed)
Received call back from Dr. Dan Humphreys office. The office is closed on Monday, so Dr. Dan Humphreys nurse was calling to let me know that we wouldn't have an answer until Tuesday.   Will plan to advise the patient to continue warfarin therapy on Monday when we talk as regularly scheduled.   Catie Darnelle Maffucci, PharmD PGY2 Ambulatory Care Pharmacy Resident, [place] [facility] Phone: [phone]

## 2018-04-23 NOTE — Telephone Encounter (Signed)
Received message from patient that his flexible sigmoidoscopy is scheduled 04/29/2018. He was told by gastroenterology to hold warfarin for 5 days prior to the procedure. I am worried about this, given patient's significant thromboembolic risk (mechanical aortic valve + atrial fibrillation, CHADSVASC2 of (HTN + CHF) plus potentially 3 for aortic plaque)). As the patient is on dialysis, enoxaparin bridging should not be used due to an increased risk of bleeds in patients on dialysis.   Contacted gastroenterology regarding my concerns; the nurse I spoke with noted that patient could hold for 3 days. I was also asked if I was giving cardiac clearance; I noted that I could not do that as I am not a cardiologist.   Contacted patient's cardiologist, Dr. Clovia Cuff with [facility]. Left message regarding my concerns. The RN I spoke with there was unsure that the patient had received cardiac clearance for this procedure. I asked that I receive a call back regarding anticoagulation recommendations for this procedure by Monday.   Contacted patient. Instructed him to continue warfarin as scheduled through the weekend.   Will route message to PCP for notification.     Catie Darnelle Maffucci, PharmD PGY2 Ambulatory Care Pharmacy Resident, [place] [facility] Phone: [phone]

## 2018-04-26 ENCOUNTER — Ambulatory Visit (INDEPENDENT_AMBULATORY_CARE_PROVIDER_SITE_OTHER): Payer: Medicare Other | Admitting: Pharmacist

## 2018-04-26 ENCOUNTER — Telehealth: Payer: Self-pay | Admitting: Gastroenterology

## 2018-04-26 DIAGNOSIS — I4821 Permanent atrial fibrillation: Secondary | ICD-10-CM | POA: Diagnosis not present

## 2018-04-26 DIAGNOSIS — N186 End stage renal disease: Secondary | ICD-10-CM | POA: Diagnosis not present

## 2018-04-26 DIAGNOSIS — I4891 Unspecified atrial fibrillation: Secondary | ICD-10-CM | POA: Diagnosis not present

## 2018-04-26 DIAGNOSIS — Z7901 Long term (current) use of anticoagulants: Secondary | ICD-10-CM | POA: Diagnosis not present

## 2018-04-26 DIAGNOSIS — N2581 Secondary hyperparathyroidism of renal origin: Secondary | ICD-10-CM | POA: Diagnosis not present

## 2018-04-26 DIAGNOSIS — Z952 Presence of prosthetic heart valve: Secondary | ICD-10-CM

## 2018-04-26 LAB — POCT INR: INR: 2.5 (ref 2.0–3.0)

## 2018-04-26 NOTE — Progress Notes (Signed)
Per Dr. Caryl Bis, the recommendation is for patient to continue to take warfarin as directed, and to hold off on flexible sigmoidoscopy until a cohesive perioperative anticoagulation plan is decided by Dr. Clovia Cuff. Patient is agreeable to this plan, and asked me to contact gastroenterology on his behalf.   Contacted Dr. Verlin Grills office; left message for her nurse to call clinic back with any questions or concerns. We recommended that we receive an anticoagulation recommendation from Dr. Clovia Cuff before patient picks a rescheduled procedure date.   Catie Darnelle Maffucci, PharmD PGY2 Ambulatory Care Pharmacy Resident, [place] [facility] Phone: [phone]

## 2018-04-26 NOTE — Telephone Encounter (Signed)
[name] station Dr. Caryl Bis office is calling regarding cardiac clearance for pt procedure 04/29/18 she states he is a high risk pt please call her  cb [phone]

## 2018-04-27 ENCOUNTER — Telehealth: Payer: Self-pay | Admitting: Pharmacist

## 2018-04-27 NOTE — Progress Notes (Signed)
I have reviewed the above note and agree. I was available to the pharmacist for consultation.  Tommi Rumps, MD

## 2018-04-27 NOTE — Telephone Encounter (Signed)
Katie clinical pharmacyst from [place] station left vm in regards to cardiac clearance she states  Dr. Clovia Cuff pt cardiologist recommends not to interefere his rx worforine cb [phone]

## 2018-04-27 NOTE — Telephone Encounter (Signed)
Spoke with Pharmacist and I refaxed blood thinner clearance for documentation of denial from Dr. Clovia Cuff.

## 2018-04-27 NOTE — Telephone Encounter (Signed)
Received call back from Dr. Dan Humphreys office [facility]). Recommendation is that the patient's anticoagulation be uninterrupted due to mechanical aortic valve around procedures.   Contacted Dr. Verlin Grills office [facility]). Left message relaying this information and asked for a call back. Will ask this office to contact patient to discuss next steps moving forward.   Will route to PCP.   Catie Darnelle Maffucci, PharmD PGY2 Ambulatory Care Pharmacy Resident, [place] [facility] Phone: [phone]

## 2018-04-27 NOTE — Telephone Encounter (Signed)
Received call from [facility]. They need a form filled out stating that the patient cannot go off anticoagulation. They asked if I had the fax number for the patient's cardiologist. I contacted [facility], got the best fax number for Dr. Dan Humphreys nurse [phone]) and let the office know that [facility] would be faxing over this form for their completion. Left message for Tameka at [facility] providing this fax number  Catie Darnelle Maffucci, PharmD PGY2 Ambulatory Care Pharmacy Resident, [place] Phone: [phone]

## 2018-04-28 DIAGNOSIS — N186 End stage renal disease: Secondary | ICD-10-CM | POA: Diagnosis not present

## 2018-04-28 DIAGNOSIS — N2581 Secondary hyperparathyroidism of renal origin: Secondary | ICD-10-CM | POA: Diagnosis not present

## 2018-04-28 NOTE — Telephone Encounter (Signed)
Received call from patient, as he had just received a call from [facility] regarding his procedure tomorrow.   I explained the calls that had transpired yesterday. He plans to give Dr. Verlin Grills office a call to see if they had received the form back from the cardiologist's office regarding anticoagulation plans yet.   I told him that we would let him know if we hear any updates from cardiology or gastroenterology.   Catie Darnelle Maffucci, PharmD PGY2 Ambulatory Care Pharmacy Resident, [place] [facility] Phone: [phone]

## 2018-04-29 ENCOUNTER — Ambulatory Visit: Admission: RE | Admit: 2018-04-29 | Payer: Medicare Other | Source: Ambulatory Visit | Admitting: Gastroenterology

## 2018-04-29 ENCOUNTER — Encounter: Admission: RE | Payer: Self-pay | Source: Ambulatory Visit

## 2018-04-29 SURGERY — SIGMOIDOSCOPY, FLEXIBLE
Anesthesia: General

## 2018-04-30 DIAGNOSIS — N186 End stage renal disease: Secondary | ICD-10-CM | POA: Diagnosis not present

## 2018-04-30 DIAGNOSIS — N2581 Secondary hyperparathyroidism of renal origin: Secondary | ICD-10-CM | POA: Diagnosis not present

## 2018-05-03 ENCOUNTER — Ambulatory Visit (INDEPENDENT_AMBULATORY_CARE_PROVIDER_SITE_OTHER): Payer: Medicare Other | Admitting: Pharmacist

## 2018-05-03 DIAGNOSIS — I4891 Unspecified atrial fibrillation: Secondary | ICD-10-CM

## 2018-05-03 DIAGNOSIS — N186 End stage renal disease: Secondary | ICD-10-CM | POA: Diagnosis not present

## 2018-05-03 DIAGNOSIS — Z952 Presence of prosthetic heart valve: Secondary | ICD-10-CM

## 2018-05-03 DIAGNOSIS — Z7901 Long term (current) use of anticoagulants: Secondary | ICD-10-CM

## 2018-05-03 DIAGNOSIS — N2581 Secondary hyperparathyroidism of renal origin: Secondary | ICD-10-CM | POA: Diagnosis not present

## 2018-05-03 LAB — POCT INR: INR: 2.3 (ref 2.0–3.0)

## 2018-05-04 DIAGNOSIS — Z952 Presence of prosthetic heart valve: Secondary | ICD-10-CM | POA: Diagnosis not present

## 2018-05-04 DIAGNOSIS — I4891 Unspecified atrial fibrillation: Secondary | ICD-10-CM | POA: Diagnosis not present

## 2018-05-04 DIAGNOSIS — I2510 Atherosclerotic heart disease of native coronary artery without angina pectoris: Secondary | ICD-10-CM | POA: Diagnosis not present

## 2018-05-04 DIAGNOSIS — I351 Nonrheumatic aortic (valve) insufficiency: Secondary | ICD-10-CM | POA: Diagnosis not present

## 2018-05-04 DIAGNOSIS — I071 Rheumatic tricuspid insufficiency: Secondary | ICD-10-CM | POA: Diagnosis not present

## 2018-05-04 DIAGNOSIS — I348 Other nonrheumatic mitral valve disorders: Secondary | ICD-10-CM | POA: Diagnosis not present

## 2018-05-04 DIAGNOSIS — I340 Nonrheumatic mitral (valve) insufficiency: Secondary | ICD-10-CM | POA: Diagnosis not present

## 2018-05-04 DIAGNOSIS — I272 Pulmonary hypertension, unspecified: Secondary | ICD-10-CM | POA: Diagnosis not present

## 2018-05-04 DIAGNOSIS — I371 Nonrheumatic pulmonary valve insufficiency: Secondary | ICD-10-CM | POA: Diagnosis not present

## 2018-05-05 DIAGNOSIS — N2581 Secondary hyperparathyroidism of renal origin: Secondary | ICD-10-CM | POA: Diagnosis not present

## 2018-05-05 DIAGNOSIS — N186 End stage renal disease: Secondary | ICD-10-CM | POA: Diagnosis not present

## 2018-05-06 DIAGNOSIS — N186 End stage renal disease: Secondary | ICD-10-CM | POA: Diagnosis not present

## 2018-05-06 DIAGNOSIS — M1990 Unspecified osteoarthritis, unspecified site: Secondary | ICD-10-CM | POA: Diagnosis not present

## 2018-05-06 DIAGNOSIS — M150 Primary generalized (osteo)arthritis: Secondary | ICD-10-CM | POA: Diagnosis not present

## 2018-05-06 DIAGNOSIS — M65321 Trigger finger, right index finger: Secondary | ICD-10-CM | POA: Diagnosis not present

## 2018-05-06 DIAGNOSIS — E663 Overweight: Secondary | ICD-10-CM | POA: Diagnosis not present

## 2018-05-06 DIAGNOSIS — Z6826 Body mass index (BMI) 26.0-26.9, adult: Secondary | ICD-10-CM | POA: Diagnosis not present

## 2018-05-06 DIAGNOSIS — M109 Gout, unspecified: Secondary | ICD-10-CM | POA: Diagnosis not present

## 2018-05-06 DIAGNOSIS — E790 Hyperuricemia without signs of inflammatory arthritis and tophaceous disease: Secondary | ICD-10-CM | POA: Diagnosis not present

## 2018-05-06 DIAGNOSIS — M810 Age-related osteoporosis without current pathological fracture: Secondary | ICD-10-CM | POA: Diagnosis not present

## 2018-05-06 DIAGNOSIS — M2550 Pain in unspecified joint: Secondary | ICD-10-CM | POA: Diagnosis not present

## 2018-05-06 DIAGNOSIS — M25431 Effusion, right wrist: Secondary | ICD-10-CM | POA: Diagnosis not present

## 2018-05-06 DIAGNOSIS — M8580 Other specified disorders of bone density and structure, unspecified site: Secondary | ICD-10-CM | POA: Diagnosis not present

## 2018-05-06 NOTE — Progress Notes (Signed)
I have reviewed the above note and agree. I was available to the pharmacist for consultation.  Tommi Rumps, MD

## 2018-05-07 DIAGNOSIS — Z992 Dependence on renal dialysis: Secondary | ICD-10-CM | POA: Diagnosis not present

## 2018-05-07 DIAGNOSIS — N2581 Secondary hyperparathyroidism of renal origin: Secondary | ICD-10-CM | POA: Diagnosis not present

## 2018-05-07 DIAGNOSIS — N051 Unspecified nephritic syndrome with focal and segmental glomerular lesions: Secondary | ICD-10-CM | POA: Diagnosis not present

## 2018-05-07 DIAGNOSIS — N186 End stage renal disease: Secondary | ICD-10-CM | POA: Diagnosis not present

## 2018-05-08 ENCOUNTER — Other Ambulatory Visit: Payer: Self-pay | Admitting: Family Medicine

## 2018-05-10 ENCOUNTER — Ambulatory Visit (INDEPENDENT_AMBULATORY_CARE_PROVIDER_SITE_OTHER): Payer: Medicare Other | Admitting: Pharmacist

## 2018-05-10 DIAGNOSIS — Z7901 Long term (current) use of anticoagulants: Secondary | ICD-10-CM

## 2018-05-10 DIAGNOSIS — Z952 Presence of prosthetic heart valve: Secondary | ICD-10-CM

## 2018-05-10 DIAGNOSIS — I4891 Unspecified atrial fibrillation: Secondary | ICD-10-CM | POA: Diagnosis not present

## 2018-05-10 DIAGNOSIS — N186 End stage renal disease: Secondary | ICD-10-CM | POA: Diagnosis not present

## 2018-05-10 DIAGNOSIS — N2581 Secondary hyperparathyroidism of renal origin: Secondary | ICD-10-CM | POA: Diagnosis not present

## 2018-05-10 LAB — POCT INR: INR: 2.1 (ref 2.0–3.0)

## 2018-05-10 NOTE — Progress Notes (Signed)
I have reviewed the above note and agree.  Tammi Boulier, M.D.  

## 2018-05-11 ENCOUNTER — Other Ambulatory Visit: Payer: Self-pay | Admitting: Pharmacist

## 2018-05-11 DIAGNOSIS — E785 Hyperlipidemia, unspecified: Secondary | ICD-10-CM

## 2018-05-11 NOTE — Telephone Encounter (Signed)
Pt contacted me this morning, asked for a refill on pravastatin.   Will route to PCP Dr. Caryl Bis.   Catie Darnelle Maffucci, PharmD, [place] PGY2 Ambulatory Care Pharmacy Resident, [place] [facility] Phone: [phone]

## 2018-05-12 DIAGNOSIS — N2581 Secondary hyperparathyroidism of renal origin: Secondary | ICD-10-CM | POA: Diagnosis not present

## 2018-05-12 DIAGNOSIS — N186 End stage renal disease: Secondary | ICD-10-CM | POA: Diagnosis not present

## 2018-05-12 MED ORDER — PRAVASTATIN SODIUM 10 MG PO TABS: ORAL_TABLET | ORAL | 2 refills | Status: DC

## 2018-05-14 DIAGNOSIS — N2581 Secondary hyperparathyroidism of renal origin: Secondary | ICD-10-CM | POA: Diagnosis not present

## 2018-05-14 DIAGNOSIS — N186 End stage renal disease: Secondary | ICD-10-CM | POA: Diagnosis not present

## 2018-05-17 ENCOUNTER — Ambulatory Visit (INDEPENDENT_AMBULATORY_CARE_PROVIDER_SITE_OTHER): Payer: Medicare Other | Admitting: Pharmacist

## 2018-05-17 DIAGNOSIS — Z7901 Long term (current) use of anticoagulants: Secondary | ICD-10-CM

## 2018-05-17 DIAGNOSIS — N2581 Secondary hyperparathyroidism of renal origin: Secondary | ICD-10-CM | POA: Diagnosis not present

## 2018-05-17 DIAGNOSIS — I4891 Unspecified atrial fibrillation: Secondary | ICD-10-CM | POA: Diagnosis not present

## 2018-05-17 DIAGNOSIS — N186 End stage renal disease: Secondary | ICD-10-CM | POA: Diagnosis not present

## 2018-05-17 DIAGNOSIS — Z952 Presence of prosthetic heart valve: Secondary | ICD-10-CM

## 2018-05-17 LAB — POCT INR: INR: 2.6 (ref 2.0–3.0)

## 2018-05-18 ENCOUNTER — Other Ambulatory Visit: Payer: Self-pay | Admitting: *Deleted

## 2018-05-18 MED ORDER — NITROGLYCERIN 0.4 MG SL SUBL: 0.4000 mg | SUBLINGUAL_TABLET | SUBLINGUAL | 0 refills | Status: DC | PRN

## 2018-05-19 DIAGNOSIS — N2581 Secondary hyperparathyroidism of renal origin: Secondary | ICD-10-CM | POA: Diagnosis not present

## 2018-05-19 DIAGNOSIS — N186 End stage renal disease: Secondary | ICD-10-CM | POA: Diagnosis not present

## 2018-05-20 NOTE — Progress Notes (Signed)
I have reviewed the above note and agree. I was available to the pharmacist for consultation.  Tommi Rumps, MD

## 2018-05-21 DIAGNOSIS — N186 End stage renal disease: Secondary | ICD-10-CM | POA: Diagnosis not present

## 2018-05-21 DIAGNOSIS — N2581 Secondary hyperparathyroidism of renal origin: Secondary | ICD-10-CM | POA: Diagnosis not present

## 2018-05-24 ENCOUNTER — Ambulatory Visit (INDEPENDENT_AMBULATORY_CARE_PROVIDER_SITE_OTHER): Payer: Medicare Other | Admitting: Pharmacist

## 2018-05-24 DIAGNOSIS — Z952 Presence of prosthetic heart valve: Secondary | ICD-10-CM

## 2018-05-24 DIAGNOSIS — I4891 Unspecified atrial fibrillation: Secondary | ICD-10-CM

## 2018-05-24 DIAGNOSIS — N186 End stage renal disease: Secondary | ICD-10-CM | POA: Diagnosis not present

## 2018-05-24 DIAGNOSIS — Z7901 Long term (current) use of anticoagulants: Secondary | ICD-10-CM

## 2018-05-24 DIAGNOSIS — N2581 Secondary hyperparathyroidism of renal origin: Secondary | ICD-10-CM | POA: Diagnosis not present

## 2018-05-24 DIAGNOSIS — I4821 Permanent atrial fibrillation: Secondary | ICD-10-CM | POA: Diagnosis not present

## 2018-05-24 LAB — POCT INR
INR: 2.7 (ref 2.0–3.0)
INR: 2.7 — AB (ref <=1.1)

## 2018-05-24 NOTE — Progress Notes (Signed)
Patient states that he is interested in stopping fluoxetine therapy. He notes that he feels "lazy and that I don't want to do anything", and is wondering if he can try going off the therapy.   Will route to PCP Dr. Caryl Bis for recommendation. If OK to stop, we won't need to taper due to fluoxetine's long half life.   Catie Darnelle Maffucci, PharmD, CPP

## 2018-05-26 DIAGNOSIS — N186 End stage renal disease: Secondary | ICD-10-CM | POA: Diagnosis not present

## 2018-05-26 DIAGNOSIS — N2581 Secondary hyperparathyroidism of renal origin: Secondary | ICD-10-CM | POA: Diagnosis not present

## 2018-05-27 ENCOUNTER — Telehealth: Payer: Self-pay | Admitting: Pharmacist

## 2018-05-27 ENCOUNTER — Ambulatory Visit: Payer: Medicare Other | Admitting: Internal Medicine

## 2018-05-27 NOTE — Telephone Encounter (Signed)
Contacted patient per Dr. Caryl Bis to follow up on patient's query about stopping fluoxetine. He notes that he wasn't sure if being "tired", and "having little motivation" was a side effect of the medication or of his other concurrent medical conditions. We discussed that this may also be a sign of uncontrolled depression. No SI/HI.   We discussed that it would be best for him to be seen by Dr. Caryl Bis. An appointment is already scheduled for 06/21/2018, and patient said he felt OK to wait and discuss possible medication dose adjustments/changes with Dr. Caryl Bis at that point.

## 2018-05-27 NOTE — Progress Notes (Signed)
I have reviewed the above note and agree. I was available to the pharmacist for consultation. Please find out if he is currently feeling depressed. If he is he would need to be seen in the office to discuss alternative medication options.   Tommi Rumps, MD

## 2018-05-27 NOTE — Telephone Encounter (Signed)
Noted.  We will see patient as scheduled.

## 2018-05-28 DIAGNOSIS — N2581 Secondary hyperparathyroidism of renal origin: Secondary | ICD-10-CM | POA: Diagnosis not present

## 2018-05-28 DIAGNOSIS — N186 End stage renal disease: Secondary | ICD-10-CM | POA: Diagnosis not present

## 2018-05-31 ENCOUNTER — Ambulatory Visit (INDEPENDENT_AMBULATORY_CARE_PROVIDER_SITE_OTHER): Payer: Medicare Other | Admitting: Pharmacist

## 2018-05-31 ENCOUNTER — Telehealth: Payer: Self-pay | Admitting: Family Medicine

## 2018-05-31 DIAGNOSIS — I4891 Unspecified atrial fibrillation: Secondary | ICD-10-CM

## 2018-05-31 DIAGNOSIS — Z952 Presence of prosthetic heart valve: Secondary | ICD-10-CM

## 2018-05-31 DIAGNOSIS — Z7901 Long term (current) use of anticoagulants: Secondary | ICD-10-CM

## 2018-05-31 DIAGNOSIS — N2581 Secondary hyperparathyroidism of renal origin: Secondary | ICD-10-CM | POA: Diagnosis not present

## 2018-05-31 DIAGNOSIS — N186 End stage renal disease: Secondary | ICD-10-CM | POA: Diagnosis not present

## 2018-05-31 LAB — POCT INR: INR: 3.8 — AB (ref 2.0–3.0)

## 2018-05-31 NOTE — Progress Notes (Signed)
I have reviewed the above note and agree. I was available to the pharmacist for consultation.  Tommi Rumps, MD

## 2018-05-31 NOTE — Telephone Encounter (Signed)
Clinical pharmacist spoke with the patient today.  See her anticoagulation note.

## 2018-05-31 NOTE — Telephone Encounter (Signed)
'  Jeremy [name]' with [facility] reports pt's INR today 3.8.  States hard copy faxed to practice. [place] called practice, spoke to Butch Penny who states she will route to PCP.

## 2018-06-01 ENCOUNTER — Encounter: Payer: Self-pay | Admitting: Cardiovascular Disease

## 2018-06-01 ENCOUNTER — Ambulatory Visit (INDEPENDENT_AMBULATORY_CARE_PROVIDER_SITE_OTHER): Payer: Medicare Other | Admitting: Cardiovascular Disease

## 2018-06-01 VITALS — BP 138/64 | HR 88 | Ht 70.0 in | Wt 181.5 lb

## 2018-06-01 DIAGNOSIS — R9439 Abnormal result of other cardiovascular function study: Secondary | ICD-10-CM | POA: Diagnosis not present

## 2018-06-01 DIAGNOSIS — E785 Hyperlipidemia, unspecified: Secondary | ICD-10-CM

## 2018-06-01 DIAGNOSIS — I25118 Atherosclerotic heart disease of native coronary artery with other forms of angina pectoris: Secondary | ICD-10-CM

## 2018-06-01 DIAGNOSIS — I4819 Other persistent atrial fibrillation: Secondary | ICD-10-CM | POA: Diagnosis not present

## 2018-06-01 DIAGNOSIS — I5021 Acute systolic (congestive) heart failure: Secondary | ICD-10-CM | POA: Diagnosis not present

## 2018-06-01 NOTE — Patient Instructions (Addendum)
Medication Instructions:  No changes If you need a refill on your cardiac medications before your next appointment, please call your pharmacy.   Lab work: Your provider would like for you to return tomorrow to have the following labs drawn: BMET, CBC and INR. Please go to the [facility] entrance and check in at the front desk. You do not need an appointment.   Testing/Procedures: Your physician has requested that you have a cardiac catheterization. Cardiac catheterization is used to diagnose and/or treat various heart conditions. Doctors may recommend this procedure for a number of different reasons. The most common reason is to evaluate chest pain. Chest pain can be a symptom of coronary artery disease (CAD), and cardiac catheterization can show whether plaque is narrowing or blocking your heart's arteries. This procedure is also used to evaluate the valves, as well as measure the blood flow and oxygen levels in different parts of your heart.   Follow-Up: At [facility], you and your health needs are our priority.  As part of our continuing mission to provide you with exceptional heart care, we have created designated Provider Care Teams.  These Care Teams include your primary Cardiologist (physician) and Advanced Practice Providers (APPs -  Physician Assistants and Nurse Practitioners) who all work together to provide you with the care you need, when you need it. You will need a follow up appointment in 4 weeks. You may see Dr. [name] or one of the following Advanced Practice Providers on your designated Care Team:   Murray Hodgkins, NP Christell Faith, PA-C . Marrianne Mood, [place] CARDIOVASCULAR DIVISION [facility] [place], [place] [address] Dept: [phone] Loc: [phone]   You are scheduled for a Cardiac Catheterization on Monday, March 2nd with Dr. Kathlyn [place].  1. Arrive at the  [place] entrance at 10:30, one hour prior to your procedure. Free valet service is available.  After entering the [facility] please check-in at the registration desk (1st desk on your right) to receive your armband. After receiving your armband someone will escort you to the cardiac cath/special procedures waiting area.  If you have any questions, please call our office at [phone], or you may call the cardiac cath lab at [facility] directly at [phone]   Special note: Every effort is made to have your procedure done on time. Please understand that emergencies sometimes delay scheduled procedures.   2. Diet: Do not eat solid foods after midnight.  You may have clear liquids that morning.  3. Labs: You will need to have blood drawn on Wednesday, February 26 at [name], Go to 1st desk on your right to register.  Address: [address] [place], [place] [address]  Open: 8am - 5pm  Phone: [phone]. You do not need to be fasting.  4. Medication instructions in preparation for your procedure: None to hold Do not hold warfarin unless informed otherwise by Dr. Tyrell Antonio office.   5. Plan for one night stay--bring personal belongings. 6. Bring a current list of your medications and current insurance cards. 7. You MUST have a responsible person to drive you home. 8. Someone MUST be with you the first 24 hours after you arrive home or your discharge will be delayed. 9. Please wear clothes that are easy to get on and off and wear slip-on shoes.  Thank you for allowing [name] to care for you!   -- [facility]

## 2018-06-01 NOTE — Progress Notes (Signed)
Cardiology Office Note   Date:  06/01/2018   ID:  Jeremy [name], DOB 1955-09-26, MRN [id number]  PCP:  Jeremy Haven, MD  Cardiologist:   Jeremy Sacramento, MD   Chief Complaint  Patient presents with  . Other    Per Dr. Caryl [name] for aortic valve disorder, afib, hf. Meds reviewed verbally with patient.       History of Present Illness: Jeremy [name] is a 63 y.o. male who was referred by Dr. Caryl [name] to establish cardiovascular care.  He was followed before at [name] for complicated medical problems. 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 Cardiac catheterization in 2013 showed nonobstructive RCA and left circumflex disease.  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  LAD had 85% mid stenosis which was treated with a 2.75 x 28 mm Xience drug-eluting stent. 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  He had an echocardiogram done in January 2020 which showed a drop in ejection fraction from normal to 35%, moderately reduced RV function, moderate biatrial enlargement, St. Jude mechanical aortic valve with mean gradient of 21 mmHg, mild mitral regurgitation and mild pulmonary hypertension.  He had recent worsening of angina with chest pain with minimal exertion in addition to substernal chest tightness which happen both with exertion and just before dialysis when he is volume overloaded. He had a [place] Myoview done in November 2019 at [name] which was abnormal high risk study with large defect in the inferior, inferoseptal and inferolateral myocardium which was fixed with  minimal reversibility.  EF was 30%.  His previous EF was normal.  The patient was placed on Imdur with no improvement in his symptoms.  He reports having atrial fibrillation for at least 2 to 3 years with no previous cardioversion.  He was on peritoneal dialysis before and was switched to hemodialysis in August 2019 due to ineffective fluid removal.  In spite of this, his shortness of breath and chest pain has worsened.  Past Medical History:  Diagnosis Date  . 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 Arthritis   . Chronic anticoagulation 06/2012  . Dialysis patient [name]) 01 01 2008  . Dysrhythmia    A Fib  . Hx of colonoscopy    2009 by Dr. Laural [name]. Normal except for small submucosal lipoma. No evidence of polyps or colitis.  . Hyperlipidemia   . Hypertension   . Hypogonadism in male 07/25/2015  . Kidney disease    Chronic kidney disease secondary to IgA nephropathy diagnosed when he was 70. 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  Marland Kitchen OSA (obstructive sleep apnea)    No CPAP  . Renal cell carcinoma ([name]) 08/04/2016    Past Surgical History:  Procedure Laterality Date  . AORTIC VALVE REPLACEMENT  3 /11 /2014   At [place]    . CAPD INSERTION N/A 02/19/2017   Procedure: LAPAROSCOPIC INSERTION CONTINUOUS AMBULATORY PERITONEAL DIALYSIS  (CAPD) CATHETER;  Surgeon: Jeremy Huxley, MD;  Location: [facility] ORS;  Service: General;  Laterality:  N/A;  . CORONARY ANGIOPLASTY WITH STENT PLACEMENT    . DG AV DIALYSIS  SHUNT ACCESS EXIST*L* OR     left arm  . EPIGASTRIC HERNIA REPAIR N/A 02/19/2017   Procedure: HERNIA REPAIR EPIGASTRIC ADULT;  Surgeon: Jeremy Bellow, MD;  Location: [facility] ORS;  Service: General;  Laterality: N/A;  . EYE SURGERY     bilateral cataract  . HERNIA REPAIR     UMBILICAL HERNIA  . HERNIA REPAIR  02/19/2017  . INNER EAR SURGERY     20  years ago  . KIDNEY TRANSPLANT    . Peritoneal Dialysis access placement  02/19/2017  . REMOVAL OF A DIALYSIS CATHETER N/A 11/23/2017   Procedure: REMOVAL OF A PERITONEAL DIALYSIS CATHETER;  Surgeon: Jeremy Huxley, MD;  Location: [facility] ORS;  Service: Vascular;  Laterality: N/A;  . TEE WITHOUT CARDIOVERSION N/A 07/21/2016   Procedure: TRANSESOPHAGEAL ECHOCARDIOGRAM (TEE);  Surgeon: Jeremy Hampshire, MD;  Location: [facility] ORS;  Service: Cardiovascular;  Laterality: N/A;     Current Outpatient Medications  Medication Sig Dispense Refill  . acetaminophen (TYLENOL) 500 MG tablet Take 1,000 mg 2 (two) times daily as needed by mouth for moderate pain.    Marland Kitchen allopurinol (ZYLOPRIM) 300 MG tablet Take 300 mg by mouth daily.  5  . cinacalcet (SENSIPAR) 30 MG tablet Take 30 mg by mouth daily. At dialysis    . Dextrose-Fructose-Sod Citrate (NAUZENE PO) Take 1-2 tablets 2 (two) times daily as needed by mouth (nausea).    . DiphenhydrAMINE HCl, Sleep, 50 MG CAPS Take 50 mg at bedtime as needed by mouth (sleep).    Marland Kitchen FLUoxetine (PROZAC) 10 MG capsule TAKE 1 CAPSULE BY MOUTH EVERY DAY 90 capsule 0  . hydroxychloroquine (PLAQUENIL) 200 MG tablet Take 400 mg by mouth at bedtime.    . isosorbide mononitrate (IMDUR) 30 MG 24 hr tablet Take 30 mg by mouth daily.    Marland Kitchen lidocaine-prilocaine (EMLA) cream Apply 1 application as needed topically (port access).    . metoprolol tartrate (LOPRESSOR) 100 MG tablet Take 0.5 tablets (50 mg total) by mouth 2 (two) times daily. (Patient taking differently: Take 50 mg by mouth daily. ) 90 tablet 3  . Multiple Vitamin (MULTIVITAMIN WITH MINERALS) TABS tablet Take 1 tablet by mouth daily.    . nitroGLYCERIN (NITROSTAT) 0.4 MG SL tablet Place 1 tablet (0.4 mg total) under the tongue every 5 (five) minutes as needed for chest pain. 75 tablet 0  . pravastatin (PRAVACHOL) 10 MG tablet Take one tablet by mouth on Monday, Wednesday, and Friday 36 tablet 2  . predniSONE (DELTASONE) 5 MG tablet  5 mg.     . Probiotic Product (PROBIOTIC ADVANCED PO) Take 1 tablet by mouth daily.    . sodium chloride (AFRIN SALINE NASAL MIST) 0.65 % nasal spray Place 1 spray into the nose as needed for congestion. 30 mL 1  . warfarin (COUMADIN) 1 MG tablet TAKE 2 TABLETS (2 MG) BY MOUTH DAILY EXCEPT ON TUESDAY WHEN YOU SHOULD TAKE 1 TABLET (1 MG) BY MOUTH 180 tablet 1   No current facility-administered medications for this visit.     Allergies:   Statins and Sertraline    Social History:  The patient  reports that he has never smoked. He has never used smokeless tobacco. He reports that he does not drink alcohol or use drugs.   Family History:  The patient's family history includes Cancer in his mother; Diabetes in his father; Heart disease in  his father.    ROS:  Please see the history of present illness.   Otherwise, review of systems are positive for none.   All other systems are reviewed and negative.    PHYSICAL EXAM: VS:  BP 138/64 (BP Location: Left Arm, Patient Position: Sitting, Cuff Size: Normal)   Pulse 88   Ht 5\' 10"  (1.778 m)   Wt 181 lb 8 oz (82.3 kg)   BMI 26.04 kg/m  , BMI Body mass index is 26.04 kg/m. GEN: Well nourished, well developed, in no acute distress  HEENT: normal  Neck: Mild JVD, no carotid bruits, or masses Cardiac: Irregularly irregular; normal mechanical heart sounds with 1 out of 6 systolic murmur at the aortic area . Respiratory:  clear to auscultation bilaterally, normal work of breathing GI: soft, nontender, nondistended, + BS MS: no deformity or atrophy  Skin: warm and dry, no rash Neuro:  Strength and sensation are intact Psych: euthymic mood, full affect Vascular: The patient has a fistula in the left arm.  No previous fistula or any kind of dialysis access work on the right arm.  Right radial pulse is slightly diminished but palpable.  EKG:  EKG is ordered today. The ekg ordered today demonstrates atrial fibrillation with ventricular rate of 88  bpm.  Right bundle branch block with inferior T wave changes suggestive of ischemia.   Recent Labs: 08/11/2017: ALT 10 11/23/2017: Hemoglobin 10.5; Potassium 3.8; Sodium 137    Lipid Panel    Component Value Date/Time   CHOL 176 08/08/2016 0946   TRIG 158.0 (H) 08/08/2016 0946   HDL 50.50 08/08/2016 0946   CHOLHDL 3 08/08/2016 0946   VLDL 31.6 08/08/2016 0946   LDLCALC 94 08/08/2016 0946   LDLDIRECT 61.0 08/11/2017 1447      Wt Readings from Last 3 Encounters:  06/01/18 181 lb 8 oz (82.3 kg)  03/25/18 191 lb 9.6 oz (86.9 kg)  03/19/18 190 lb 1.9 oz (86.2 kg)      Other studies Reviewed: Additional studies/ records that were reviewed today include: Previous extensive cardiac records at [name] and [name]. Review of the above records demonstrates: Summarized above  No flowsheet data found.    ASSESSMENT AND PLAN:  1.  Coronary artery disease involving native coronary arteries with progressive class III angina: This is in spite of treatment with metoprolol and recent addition of Imdur.  The patient had a nuclear stress test done in November which was high risk as outlined above.  Given his continued symptoms and high risk features a nuclear stress test in addition to a drop in his ejection fraction, I think the best option is to proceed with coronary angiography and possible PCI.  This is a complicated situation given that the patient is on long-term anticoagulation with warfarin for mechanical aortic valve and atrial fibrillation.  He cannot be bridged with Lovenox given that he is on dialysis.  The alternative would be hospitalization and bridging with heparin. However, we might be able to do this via the right arm given that he has no fistula or graft on that side.  I discussed the procedure in details as well as risks and benefits and he is agreeable to proceed.  I explained to him that he is higher risk than the average patient given all his comorbidities and he understands.   If his labs show significantly elevated INR, I might hold warfarin for 1 or 2 doses before the procedure.  2.  Acute systolic heart failure:  Most likely due to ischemic cardiomyopathy.  Will perform a right heart catheterization at the same time.  Continue treatment with metoprolol but I will consider switching to metoprolol succinate or carvedilol.  We should also consider treatment with a combination of hydralazine and Imdur.  3.  Atrial fibrillation: It appears that he has been in atrial fibrillation for at least 2 to 3 years.  It is not entirely clear to me if we will be able to get him back in sinus rhythm without an antiarrhythmic medication such as amiodarone.  He had moderate biatrial enlargement on echo.  This will be addressed after the above.  4.  Hyperlipidemia: Intolerance to statins due to myalgia.  He is currently on pravastatin.    Disposition:   FU with me in 1 month  Signed,  Jeremy Sacramento, MD  06/01/2018 5:28 PM    [place]

## 2018-06-01 NOTE — H&P (View-Only) (Signed)
Cardiology Office Note   Date:  06/01/2018   ID:  Jeremy [name], DOB 05-Mar-1956, MRN [id number]  PCP:  Leone Haven, MD  Cardiologist:   Kathlyn Sacramento, MD   Chief Complaint  Patient presents with  . Other    Per Dr. Caryl Bis for aortic valve disorder, afib, hf. Meds reviewed verbally with patient.       History of Present Illness: Jeremy [name] is a 63 y.o. male who was referred by Dr. Caryl Bis to establish cardiovascular care.  He was followed before at [facility] for complicated medical problems. 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 Cardiac catheterization in 2013 showed nonobstructive RCA and left circumflex disease.  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  LAD had 85% mid stenosis which was treated with a 2.75 x 28 mm Xience drug-eluting stent. 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  He had an echocardiogram done in January 2020 which showed a drop in ejection fraction from normal to 35%, moderately reduced RV function, moderate biatrial enlargement, St. Jude mechanical aortic valve with mean gradient of 21 mmHg, mild mitral regurgitation and mild pulmonary hypertension.  He had recent worsening of angina with chest pain with minimal exertion in addition to substernal chest tightness which happen both with exertion and just before dialysis when he is volume overloaded. He had a [place] Myoview done in November 2019 at [facility] which was abnormal high risk study with large defect in the inferior, inferoseptal and inferolateral myocardium which was fixed with  minimal reversibility.  EF was 30%.  His previous EF was normal.  The patient was placed on Imdur with no improvement in his symptoms.  He reports having atrial fibrillation for at least 2 to 3 years with no previous cardioversion.  He was on peritoneal dialysis before and was switched to hemodialysis in August 2019 due to ineffective fluid removal.  In spite of this, his shortness of breath and chest pain has worsened.  Past Medical History:  Diagnosis Date  . 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 Arthritis   . Chronic anticoagulation 06/2012  . Dialysis patient [facility]) 01 01 2008  . Dysrhythmia    A Fib  . Hx of colonoscopy    2009 by Dr. Laural Golden. Normal except for small submucosal lipoma. No evidence of polyps or colitis.  . Hyperlipidemia   . Hypertension   . Hypogonadism in male 07/25/2015  . Kidney disease    Chronic kidney disease secondary to IgA nephropathy diagnosed when he was 73. 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  Marland Kitchen OSA (obstructive sleep apnea)    No CPAP  . Renal cell carcinoma ([place]) 08/04/2016    Past Surgical History:  Procedure Laterality Date  . AORTIC VALVE REPLACEMENT  3 /11 /2014   At [place]    . CAPD INSERTION N/A 02/19/2017   Procedure: LAPAROSCOPIC INSERTION CONTINUOUS AMBULATORY PERITONEAL DIALYSIS  (CAPD) CATHETER;  Surgeon: Algernon Huxley, MD;  Location: [facility] ORS;  Service: General;  Laterality:  N/A;  . CORONARY ANGIOPLASTY WITH STENT PLACEMENT    . DG AV DIALYSIS  SHUNT ACCESS EXIST*L* OR     left arm  . EPIGASTRIC HERNIA REPAIR N/A 02/19/2017   Procedure: HERNIA REPAIR EPIGASTRIC ADULT;  Surgeon: Robert Bellow, MD;  Location: [facility] ORS;  Service: General;  Laterality: N/A;  . EYE SURGERY     bilateral cataract  . HERNIA REPAIR     UMBILICAL HERNIA  . HERNIA REPAIR  02/19/2017  . INNER EAR SURGERY     20  years ago  . KIDNEY TRANSPLANT    . Peritoneal Dialysis access placement  02/19/2017  . REMOVAL OF A DIALYSIS CATHETER N/A 11/23/2017   Procedure: REMOVAL OF A PERITONEAL DIALYSIS CATHETER;  Surgeon: Algernon Huxley, MD;  Location: [facility] ORS;  Service: Vascular;  Laterality: N/A;  . TEE WITHOUT CARDIOVERSION N/A 07/21/2016   Procedure: TRANSESOPHAGEAL ECHOCARDIOGRAM (TEE);  Surgeon: Wellington Hampshire, MD;  Location: [facility] ORS;  Service: Cardiovascular;  Laterality: N/A;     Current Outpatient Medications  Medication Sig Dispense Refill  . acetaminophen (TYLENOL) 500 MG tablet Take 1,000 mg 2 (two) times daily as needed by mouth for moderate pain.    Marland Kitchen allopurinol (ZYLOPRIM) 300 MG tablet Take 300 mg by mouth daily.  5  . cinacalcet (SENSIPAR) 30 MG tablet Take 30 mg by mouth daily. At dialysis    . Dextrose-Fructose-Sod Citrate (NAUZENE PO) Take 1-2 tablets 2 (two) times daily as needed by mouth (nausea).    . DiphenhydrAMINE HCl, Sleep, 50 MG CAPS Take 50 mg at bedtime as needed by mouth (sleep).    Marland Kitchen FLUoxetine (PROZAC) 10 MG capsule TAKE 1 CAPSULE BY MOUTH EVERY DAY 90 capsule 0  . hydroxychloroquine (PLAQUENIL) 200 MG tablet Take 400 mg by mouth at bedtime.    . isosorbide mononitrate (IMDUR) 30 MG 24 hr tablet Take 30 mg by mouth daily.    Marland Kitchen lidocaine-prilocaine (EMLA) cream Apply 1 application as needed topically (port access).    . metoprolol tartrate (LOPRESSOR) 100 MG tablet Take 0.5 tablets (50 mg total) by mouth 2 (two) times daily. (Patient taking differently: Take 50 mg by mouth daily. ) 90 tablet 3  . Multiple Vitamin (MULTIVITAMIN WITH MINERALS) TABS tablet Take 1 tablet by mouth daily.    . nitroGLYCERIN (NITROSTAT) 0.4 MG SL tablet Place 1 tablet (0.4 mg total) under the tongue every 5 (five) minutes as needed for chest pain. 75 tablet 0  . pravastatin (PRAVACHOL) 10 MG tablet Take one tablet by mouth on Monday, Wednesday, and Friday 36 tablet 2  . predniSONE (DELTASONE) 5 MG tablet  5 mg.     . Probiotic Product (PROBIOTIC ADVANCED PO) Take 1 tablet by mouth daily.    . sodium chloride (AFRIN SALINE NASAL MIST) 0.65 % nasal spray Place 1 spray into the nose as needed for congestion. 30 mL 1  . warfarin (COUMADIN) 1 MG tablet TAKE 2 TABLETS (2 MG) BY MOUTH DAILY EXCEPT ON TUESDAY WHEN YOU SHOULD TAKE 1 TABLET (1 MG) BY MOUTH 180 tablet 1   No current facility-administered medications for this visit.     Allergies:   Statins and Sertraline    Social History:  The patient  reports that he has never smoked. He has never used smokeless tobacco. He reports that he does not drink alcohol or use drugs.   Family History:  The patient's family history includes Cancer in his mother; Diabetes in his father; Heart disease in  his father.    ROS:  Please see the history of present illness.   Otherwise, review of systems are positive for none.   All other systems are reviewed and negative.    PHYSICAL EXAM: VS:  BP 138/64 (BP Location: Left Arm, Patient Position: Sitting, Cuff Size: Normal)   Pulse 88   Ht 5\' 10"  (1.778 m)   Wt 181 lb 8 oz (82.3 kg)   BMI 26.04 kg/m  , BMI Body mass index is 26.04 kg/m. GEN: Well nourished, well developed, in no acute distress  HEENT: normal  Neck: Mild JVD, no carotid bruits, or masses Cardiac: Irregularly irregular; normal mechanical heart sounds with 1 out of 6 systolic murmur at the aortic area . Respiratory:  clear to auscultation bilaterally, normal work of breathing GI: soft, nontender, nondistended, + BS MS: no deformity or atrophy  Skin: warm and dry, no rash Neuro:  Strength and sensation are intact Psych: euthymic mood, full affect Vascular: The patient has a fistula in the left arm.  No previous fistula or any kind of dialysis access work on the right arm.  Right radial pulse is slightly diminished but palpable.  EKG:  EKG is ordered today. The ekg ordered today demonstrates atrial fibrillation with ventricular rate of 88  bpm.  Right bundle branch block with inferior T wave changes suggestive of ischemia.   Recent Labs: 08/11/2017: ALT 10 11/23/2017: Hemoglobin 10.5; Potassium 3.8; Sodium 137    Lipid Panel    Component Value Date/Time   CHOL 176 08/08/2016 0946   TRIG 158.0 (H) 08/08/2016 0946   HDL 50.50 08/08/2016 0946   CHOLHDL 3 08/08/2016 0946   VLDL 31.6 08/08/2016 0946   LDLCALC 94 08/08/2016 0946   LDLDIRECT 61.0 08/11/2017 1447      Wt Readings from Last 3 Encounters:  06/01/18 181 lb 8 oz (82.3 kg)  03/25/18 191 lb 9.6 oz (86.9 kg)  03/19/18 190 lb 1.9 oz (86.2 kg)      Other studies Reviewed: Additional studies/ records that were reviewed today include: Previous extensive cardiac records at [facility] and [facility]. Review of the above records demonstrates: Summarized above  No flowsheet data found.    ASSESSMENT AND PLAN:  1.  Coronary artery disease involving native coronary arteries with progressive class III angina: This is in spite of treatment with metoprolol and recent addition of Imdur.  The patient had a nuclear stress test done in November which was high risk as outlined above.  Given his continued symptoms and high risk features a nuclear stress test in addition to a drop in his ejection fraction, I think the best option is to proceed with coronary angiography and possible PCI.  This is a complicated situation given that the patient is on long-term anticoagulation with warfarin for mechanical aortic valve and atrial fibrillation.  He cannot be bridged with Lovenox given that he is on dialysis.  The alternative would be hospitalization and bridging with heparin. However, we might be able to do this via the right arm given that he has no fistula or graft on that side.  I discussed the procedure in details as well as risks and benefits and he is agreeable to proceed.  I explained to him that he is higher risk than the average patient given all his comorbidities and he understands.   If his labs show significantly elevated INR, I might hold warfarin for 1 or 2 doses before the procedure.  2.  Acute systolic heart failure:  Most likely due to ischemic cardiomyopathy.  Will perform a right heart catheterization at the same time.  Continue treatment with metoprolol but I will consider switching to metoprolol succinate or carvedilol.  We should also consider treatment with a combination of hydralazine and Imdur.  3.  Atrial fibrillation: It appears that he has been in atrial fibrillation for at least 2 to 3 years.  It is not entirely clear to me if we will be able to get him back in sinus rhythm without an antiarrhythmic medication such as amiodarone.  He had moderate biatrial enlargement on echo.  This will be addressed after the above.  4.  Hyperlipidemia: Intolerance to statins due to myalgia.  He is currently on pravastatin.    Disposition:   FU with me in 1 month  Signed,  Kathlyn Sacramento, MD  06/01/2018 5:28 PM    [place]

## 2018-06-02 ENCOUNTER — Other Ambulatory Visit
Admission: RE | Admit: 2018-06-02 | Discharge: 2018-06-02 | Disposition: A | Discharge status: Home / Self Care | Payer: Medicare Other | Source: Ambulatory Visit | Attending: Cardiovascular Disease | Admitting: Cardiovascular Disease

## 2018-06-02 ENCOUNTER — Telehealth: Payer: Self-pay | Admitting: *Deleted

## 2018-06-02 DIAGNOSIS — R9439 Abnormal result of other cardiovascular function study: Secondary | ICD-10-CM | POA: Insufficient documentation

## 2018-06-02 DIAGNOSIS — I5021 Acute systolic (congestive) heart failure: Secondary | ICD-10-CM | POA: Insufficient documentation

## 2018-06-02 DIAGNOSIS — N2581 Secondary hyperparathyroidism of renal origin: Secondary | ICD-10-CM | POA: Diagnosis not present

## 2018-06-02 DIAGNOSIS — N186 End stage renal disease: Secondary | ICD-10-CM | POA: Diagnosis not present

## 2018-06-02 LAB — CBC
HCT: 35.4 % — ABNORMAL LOW (ref 39.0–52.0)
HEMOGLOBIN: 11.2 g/dL — AB (ref 13.0–17.0)
MCH: 34 pg (ref 26.0–34.0)
MCHC: 31.6 g/dL (ref 30.0–36.0)
MCV: 107.6 fL — ABNORMAL HIGH (ref 80.0–100.0)
Platelets: 128 10*3/uL — ABNORMAL LOW (ref 150–400)
RBC: 3.29 MIL/uL — ABNORMAL LOW (ref 4.22–5.81)
RDW: 14.5 % (ref 11.5–15.5)
WBC: 5.2 10*3/uL (ref 4.0–10.5)
nRBC: 0 % (ref 0.0–0.2)

## 2018-06-02 LAB — BASIC METABOLIC PANEL
Anion gap: 10 (ref 5–15)
BUN: 14 mg/dL (ref 8–23)
CO2: 33 mmol/L — ABNORMAL HIGH (ref 22–32)
Calcium: 8.5 mg/dL — ABNORMAL LOW (ref 8.9–10.3)
Chloride: 97 mmol/L — ABNORMAL LOW (ref 98–111)
Creatinine, Ser: 3.45 mg/dL — ABNORMAL HIGH (ref 0.61–1.24)
GFR calc Af Amer: 21 mL/min — ABNORMAL LOW (ref >60)
GFR, EST NON AFRICAN AMERICAN: 18 mL/min — AB (ref >60)
Glucose, Bld: 125 mg/dL — ABNORMAL HIGH (ref 70–99)
Potassium: 3.9 mmol/L (ref 3.5–5.1)
Sodium: 140 mmol/L (ref 135–145)

## 2018-06-02 LAB — PROTIME-INR
INR: 2.7 — ABNORMAL HIGH (ref 0.8–1.2)
Prothrombin Time: 28.4 seconds — ABNORMAL HIGH (ref 11.4–15.2)

## 2018-06-02 NOTE — Telephone Encounter (Signed)
Left a message for the patient to call back.  

## 2018-06-02 NOTE — Telephone Encounter (Signed)
Patient made aware of results and verbalized understanding. He will start holding his warfarin on Saturday 2/29.

## 2018-06-02 NOTE — Telephone Encounter (Signed)
-----   Message from Wellington Hampshire, MD sent at 06/02/2018  1:12 PM EST ----- Stable pre-angiogram labs. INR was 2.7. I want him to hold Warfarin 2 days before the cath.

## 2018-06-04 DIAGNOSIS — N186 End stage renal disease: Secondary | ICD-10-CM | POA: Diagnosis not present

## 2018-06-04 DIAGNOSIS — N2581 Secondary hyperparathyroidism of renal origin: Secondary | ICD-10-CM | POA: Diagnosis not present

## 2018-06-05 DIAGNOSIS — N186 End stage renal disease: Secondary | ICD-10-CM | POA: Diagnosis not present

## 2018-06-05 DIAGNOSIS — Z992 Dependence on renal dialysis: Secondary | ICD-10-CM | POA: Diagnosis not present

## 2018-06-05 DIAGNOSIS — T8610 Unspecified complication of kidney transplant: Secondary | ICD-10-CM | POA: Diagnosis not present

## 2018-06-07 ENCOUNTER — Ambulatory Visit
Admission: RE | Admit: 2018-06-07 | Discharge: 2018-06-07 | Disposition: A | Discharge status: Home / Self Care | Payer: Medicare Other | Attending: Cardiovascular Disease | Admitting: Cardiovascular Disease

## 2018-06-07 ENCOUNTER — Telehealth: Payer: Self-pay | Admitting: Pharmacist

## 2018-06-07 ENCOUNTER — Encounter
Admission: RE | Disposition: A | Discharge status: Home / Self Care | Payer: Self-pay | Source: Home / Self Care | Attending: Cardiovascular Disease

## 2018-06-07 ENCOUNTER — Other Ambulatory Visit: Payer: Self-pay

## 2018-06-07 DIAGNOSIS — Z7901 Long term (current) use of anticoagulants: Secondary | ICD-10-CM | POA: Diagnosis not present

## 2018-06-07 DIAGNOSIS — Z85528 Personal history of other malignant neoplasm of kidney: Secondary | ICD-10-CM | POA: Diagnosis not present

## 2018-06-07 DIAGNOSIS — Z79899 Other long term (current) drug therapy: Secondary | ICD-10-CM | POA: Insufficient documentation

## 2018-06-07 DIAGNOSIS — E785 Hyperlipidemia, unspecified: Secondary | ICD-10-CM | POA: Insufficient documentation

## 2018-06-07 DIAGNOSIS — N2581 Secondary hyperparathyroidism of renal origin: Secondary | ICD-10-CM | POA: Diagnosis not present

## 2018-06-07 DIAGNOSIS — R9439 Abnormal result of other cardiovascular function study: Secondary | ICD-10-CM | POA: Diagnosis present

## 2018-06-07 DIAGNOSIS — I4891 Unspecified atrial fibrillation: Secondary | ICD-10-CM | POA: Diagnosis not present

## 2018-06-07 DIAGNOSIS — Z905 Acquired absence of kidney: Secondary | ICD-10-CM | POA: Diagnosis not present

## 2018-06-07 DIAGNOSIS — Z888 Allergy status to other drugs, medicaments and biological substances status: Secondary | ICD-10-CM | POA: Diagnosis not present

## 2018-06-07 DIAGNOSIS — Z952 Presence of prosthetic heart valve: Secondary | ICD-10-CM | POA: Insufficient documentation

## 2018-06-07 DIAGNOSIS — D631 Anemia in chronic kidney disease: Secondary | ICD-10-CM | POA: Insufficient documentation

## 2018-06-07 DIAGNOSIS — M1990 Unspecified osteoarthritis, unspecified site: Secondary | ICD-10-CM | POA: Diagnosis not present

## 2018-06-07 DIAGNOSIS — I2510 Atherosclerotic heart disease of native coronary artery without angina pectoris: Secondary | ICD-10-CM | POA: Diagnosis not present

## 2018-06-07 DIAGNOSIS — I5022 Chronic systolic (congestive) heart failure: Secondary | ICD-10-CM

## 2018-06-07 DIAGNOSIS — I25118 Atherosclerotic heart disease of native coronary artery with other forms of angina pectoris: Secondary | ICD-10-CM | POA: Diagnosis not present

## 2018-06-07 DIAGNOSIS — Z940 Kidney transplant status: Secondary | ICD-10-CM | POA: Diagnosis not present

## 2018-06-07 DIAGNOSIS — Z992 Dependence on renal dialysis: Secondary | ICD-10-CM | POA: Diagnosis not present

## 2018-06-07 DIAGNOSIS — I272 Pulmonary hypertension, unspecified: Secondary | ICD-10-CM | POA: Insufficient documentation

## 2018-06-07 DIAGNOSIS — Z955 Presence of coronary angioplasty implant and graft: Secondary | ICD-10-CM | POA: Diagnosis not present

## 2018-06-07 DIAGNOSIS — I5023 Acute on chronic systolic (congestive) heart failure: Secondary | ICD-10-CM | POA: Diagnosis not present

## 2018-06-07 DIAGNOSIS — G4733 Obstructive sleep apnea (adult) (pediatric): Secondary | ICD-10-CM | POA: Insufficient documentation

## 2018-06-07 DIAGNOSIS — I350 Nonrheumatic aortic (valve) stenosis: Secondary | ICD-10-CM | POA: Insufficient documentation

## 2018-06-07 DIAGNOSIS — I132 Hypertensive heart and chronic kidney disease with heart failure and with stage 5 chronic kidney disease, or end stage renal disease: Secondary | ICD-10-CM | POA: Diagnosis not present

## 2018-06-07 DIAGNOSIS — N186 End stage renal disease: Secondary | ICD-10-CM | POA: Diagnosis not present

## 2018-06-07 HISTORY — PX: RIGHT/LEFT HEART CATH AND CORONARY ANGIOGRAPHY: CATH118266

## 2018-06-07 LAB — PROTIME-INR
INR: 1.7 — ABNORMAL HIGH (ref 0.8–1.2)
Prothrombin Time: 20.1 seconds — ABNORMAL HIGH (ref 11.4–15.2)

## 2018-06-07 SURGERY — RIGHT/LEFT HEART CATH AND CORONARY ANGIOGRAPHY
Anesthesia: Moderate Sedation | Laterality: Bilateral

## 2018-06-07 MED ORDER — SODIUM CHLORIDE 0.9% FLUSH: 3.0000 mL | Freq: Two times a day (BID) | INTRAVENOUS | Status: DC

## 2018-06-07 MED ORDER — HEPARIN SODIUM (PORCINE) 1000 UNIT/ML IJ SOLN
INTRAMUSCULAR | Status: AC
  Filled 2018-06-07: qty 1

## 2018-06-07 MED ORDER — ASPIRIN 81 MG PO CHEW: 81.0000 mg | CHEWABLE_TABLET | ORAL | Status: DC

## 2018-06-07 MED ORDER — FENTANYL CITRATE (PF) 100 MCG/2ML IJ SOLN
INTRAMUSCULAR | Status: DC | PRN
  Administered 2018-06-07 (×2): 25 ug via INTRAVENOUS

## 2018-06-07 MED ORDER — SODIUM CHLORIDE 0.9% FLUSH: 3.0000 mL | INTRAVENOUS | Status: DC | PRN

## 2018-06-07 MED ORDER — ACETAMINOPHEN 325 MG PO TABS: 650.0000 mg | ORAL_TABLET | ORAL | Status: DC | PRN

## 2018-06-07 MED ORDER — VERAPAMIL HCL 2.5 MG/ML IV SOLN
INTRAVENOUS | Status: AC
  Filled 2018-06-07: qty 2

## 2018-06-07 MED ORDER — SODIUM CHLORIDE 0.9 % IV SOLN: 250.0000 mL | INTRAVENOUS | Status: DC | PRN

## 2018-06-07 MED ORDER — SODIUM CHLORIDE 0.9 % IV SOLN
INTRAVENOUS | Status: DC
  Administered 2018-06-07: 12:00:00 via INTRAVENOUS

## 2018-06-07 MED ORDER — IOPAMIDOL (ISOVUE-300) INJECTION 61%
INTRAVENOUS | Status: DC | PRN
  Administered 2018-06-07: 90 mL via INTRA_ARTERIAL

## 2018-06-07 MED ORDER — MIDAZOLAM HCL 2 MG/2ML IJ SOLN
INTRAMUSCULAR | Status: DC | PRN
  Administered 2018-06-07: 0.5 mg via INTRAVENOUS
  Administered 2018-06-07: 1 mg via INTRAVENOUS

## 2018-06-07 MED ORDER — HEPARIN (PORCINE) IN NACL 1000-0.9 UT/500ML-% IV SOLN
INTRAVENOUS | Status: AC
  Filled 2018-06-07: qty 1000

## 2018-06-07 MED ORDER — MIDAZOLAM HCL 2 MG/2ML IJ SOLN
INTRAMUSCULAR | Status: AC
  Filled 2018-06-07: qty 2

## 2018-06-07 MED ORDER — HEPARIN (PORCINE) IN NACL 2000-0.9 UNIT/L-% IV SOLN
INTRAVENOUS | Status: DC | PRN
  Administered 2018-06-07: 1000 mL

## 2018-06-07 MED ORDER — FENTANYL CITRATE (PF) 100 MCG/2ML IJ SOLN
INTRAMUSCULAR | Status: AC
  Filled 2018-06-07: qty 2

## 2018-06-07 MED ORDER — ONDANSETRON HCL 4 MG/2ML IJ SOLN: 4.0000 mg | Freq: Four times a day (QID) | INTRAMUSCULAR | Status: DC | PRN

## 2018-06-07 SURGICAL SUPPLY — 16 items
CANNULA 5F STIFF (CANNULA) ×3 IMPLANT
CATH BALLN WEDGE 5F 110CM (CATHETERS) ×3 IMPLANT
CATH INFINITI 5FR JL4 (CATHETERS) ×3 IMPLANT
CATH INFINITI JR4 5F (CATHETERS) ×3 IMPLANT
COVER PROBE U/S 5X48 (MISCELLANEOUS) ×3 IMPLANT
DEVICE CLOSURE MYNXGRIP 5F (Vascular Products) ×3 IMPLANT
GLIDESHEATH SLEND SS 6F .021 (SHEATH) ×3 IMPLANT
GUIDEWIRE EMER 3M J .025X150CM (WIRE) ×3 IMPLANT
KIT MANI 3VAL PERCEP (MISCELLANEOUS) ×3 IMPLANT
KIT RIGHT HEART (MISCELLANEOUS) ×3 IMPLANT
NEEDLE PERC 18GX7CM (NEEDLE) ×3 IMPLANT
PACK CARDIAC CATH (CUSTOM PROCEDURE TRAY) ×3 IMPLANT
SHEATH AVANTI 5FR X 11CM (SHEATH) ×3 IMPLANT
SHEATH GLIDE SLENDER 4/5FR (SHEATH) ×3 IMPLANT
WIRE GUIDERIGHT .035X150 (WIRE) ×3 IMPLANT
WIRE ROSEN-J .035X260CM (WIRE) ×3 IMPLANT

## 2018-06-07 NOTE — Interval H&P Note (Signed)
Cath Lab Visit (complete for each Cath Lab visit)  Clinical Evaluation Leading to the Procedure:   ACS: No.  Non-ACS:    Anginal Classification: CCS III  Anti-ischemic medical therapy: Maximal Therapy (2 or more classes of medications)  Non-Invasive Test Results: High-risk stress test findings: cardiac mortality >3%/year  Prior CABG: No previous CABG      History and Physical Interval Note:  06/07/2018 12:03 PM  [name]  has presented today for surgery, with the diagnosis of LT and RT Cath    Abnormal stress test   Systolic heart failure  The various methods of treatment have been discussed with the patient and family. After consideration of risks, benefits and other options for treatment, the patient has consented to  Procedure(s): RIGHT/LEFT HEART CATH AND CORONARY ANGIOGRAPHY (Bilateral) as a surgical intervention .  The patient's history has been reviewed, patient examined, no change in status, stable for surgery.  I have reviewed the patient's chart and labs.  Questions were answered to the patient's satisfaction.     Kathlyn Sacramento

## 2018-06-07 NOTE — Progress Notes (Signed)
Dr. [name] at bedside speaking with pt. And his girlfriend re: cath results. Both verbalized understanding of conversation and need for CABG. Pt. To resume Coumadin tonight at home per MD. Pt. To follow up at surgeon's office.

## 2018-06-07 NOTE — Telephone Encounter (Signed)
Contacted patient for weekly INR check. Per chart review, patient had LHC/RHC today and was instructed to hold warfarin on Saturday/Sunday. He was instructed to resume therapy today. INR 1.7 today.   Will call patient tomorrow for warfarin dosing.   Catie Darnelle Maffucci, PharmD, [place] PGY2 Ambulatory Care Pharmacy Resident, [facility] Phone: [phone]

## 2018-06-08 ENCOUNTER — Ambulatory Visit (INDEPENDENT_AMBULATORY_CARE_PROVIDER_SITE_OTHER): Payer: Medicare Other | Admitting: Pharmacist

## 2018-06-08 DIAGNOSIS — Z952 Presence of prosthetic heart valve: Secondary | ICD-10-CM

## 2018-06-08 DIAGNOSIS — Z7901 Long term (current) use of anticoagulants: Secondary | ICD-10-CM | POA: Diagnosis not present

## 2018-06-08 DIAGNOSIS — I4891 Unspecified atrial fibrillation: Secondary | ICD-10-CM

## 2018-06-08 LAB — POCT INR: INR: 1.7 — AB (ref 2.0–3.0)

## 2018-06-08 NOTE — Progress Notes (Signed)
Patient should increase dose to 2 mg every day and recheck INR in 7-10 days  Philis Nettle FNP

## 2018-06-08 NOTE — Progress Notes (Signed)
I added a note  I think we should bump him to 2 mg every day  And recheck INR in 7-10 days  Thanks! LG

## 2018-06-08 NOTE — Telephone Encounter (Signed)
This encounter was created in error - please disregard.

## 2018-06-09 DIAGNOSIS — D631 Anemia in chronic kidney disease: Secondary | ICD-10-CM | POA: Diagnosis not present

## 2018-06-09 DIAGNOSIS — N2581 Secondary hyperparathyroidism of renal origin: Secondary | ICD-10-CM | POA: Diagnosis not present

## 2018-06-09 DIAGNOSIS — N186 End stage renal disease: Secondary | ICD-10-CM | POA: Diagnosis not present

## 2018-06-10 ENCOUNTER — Ambulatory Visit (INDEPENDENT_AMBULATORY_CARE_PROVIDER_SITE_OTHER): Payer: Medicare Other | Admitting: Pharmacist

## 2018-06-10 ENCOUNTER — Institutional Professional Consult (permissible substitution) (INDEPENDENT_AMBULATORY_CARE_PROVIDER_SITE_OTHER): Payer: Medicare Other | Admitting: Thoracic Surgery (Cardiothoracic Vascular Surgery)

## 2018-06-10 ENCOUNTER — Telehealth: Payer: Self-pay | Admitting: Cardiovascular Disease

## 2018-06-10 VITALS — BP 110/63 | HR 84 | Resp 20 | Ht 70.0 in | Wt 182.0 lb

## 2018-06-10 DIAGNOSIS — Z7901 Long term (current) use of anticoagulants: Secondary | ICD-10-CM

## 2018-06-10 DIAGNOSIS — I4891 Unspecified atrial fibrillation: Secondary | ICD-10-CM | POA: Diagnosis not present

## 2018-06-10 DIAGNOSIS — I2510 Atherosclerotic heart disease of native coronary artery without angina pectoris: Secondary | ICD-10-CM

## 2018-06-10 DIAGNOSIS — Z952 Presence of prosthetic heart valve: Secondary | ICD-10-CM

## 2018-06-10 LAB — POCT INR: INR: 1.9 — AB (ref 2.0–3.0)

## 2018-06-10 NOTE — Telephone Encounter (Signed)
Please call regarding Pravastatin, if patient should be taking

## 2018-06-10 NOTE — Progress Notes (Signed)
What is he taking now? There are 2 different versions of medication instructions  If he is taking 2 mg every day, I am thinking we can bump him up to 3 mg daily

## 2018-06-10 NOTE — Progress Notes (Signed)
In "dose instructions" and "description"; I told him to boost today and tomorrow with 3 mg, then return to 2 mg on Sat/Sun and we'll check Monday and go from there. I fear that 3 mg daily over the weekend will shoot him too high, he is very sensitive to slight changes.

## 2018-06-10 NOTE — Progress Notes (Signed)
PCP is Leone Haven, MD Referring Provider is Wellington Hampshire, MD  Chief Complaint  Patient presents with  . Coronary Artery Disease    Surgical eval, Cardiac Cath 06/07/2018, ECHO 05/04/2018@ [facility]    HPI: [name] is sent for consultation for possible coronary bypass grafting.  Jeremy [name] is a 63 year old gentleman with a complex medical history.  He had aortic stenosis treated with an aortic valve replacement at [facility] in 2014.  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  He has been back on dialysis since 2017.  He had bilateral renal cell carcinoma requiring nephrectomy, hypertension, hyperlipidemia, chronic atrial fibrillation, arthritis, anemia of chronic disease, and obstructive sleep apnea requiring CPAP.  He has been having chest pain since last fall.  He had a stress test which showed baseline ejection fraction of 35% which deteriorated with stress.  He had an echocardiogram at [facility] in January which showed an ejection fraction of 35%.  There was good function of the prosthetic aortic valve.  There was mild AI, MR, TR.  There was moderate pulmonary hypertension and right ventricular dysfunction.  He did not hear back from [facility] and became upset with him.  He went to Dr. [name].  Dr. [name] performed cardiac catheterization which showed significant disease in the LAD and right coronary.  He is now referred for possible bypass grafting.  He denies any rest symptoms.  He does have exertional angina which she describes as a pressure sensation in his chest.  This is worse when he goes 2 days without dialysis.   Past Medical History:  Diagnosis Date  . 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 Arthritis   . Chronic anticoagulation 06/2012  . Dialysis patient [facility]) 01 01 2008  . Dysrhythmia    A Fib  . Hx of colonoscopy    2009 by Dr. Laural Golden. Normal except for small submucosal lipoma. No evidence of  polyps or colitis.  . Hyperlipidemia   . Hypertension   . Hypogonadism in male 07/25/2015  . Kidney disease    Chronic kidney disease secondary to IgA nephropathy diagnosed when he was 6. 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  Marland Kitchen OSA (obstructive sleep apnea)    No CPAP  . Renal cell carcinoma ([place]) 08/04/2016    Past Surgical History:  Procedure Laterality Date  . AORTIC VALVE REPLACEMENT  3 /11 /2014   At [place]    . CAPD INSERTION N/A 02/19/2017   Procedure: LAPAROSCOPIC INSERTION CONTINUOUS AMBULATORY PERITONEAL DIALYSIS  (CAPD) CATHETER;  Surgeon: Algernon Huxley, MD;  Location: [facility] ORS;  Service: General;  Laterality: N/A;  . CORONARY ANGIOPLASTY WITH STENT PLACEMENT    . DG AV DIALYSIS  SHUNT ACCESS EXIST*L* OR     left arm  . EPIGASTRIC HERNIA REPAIR N/A 02/19/2017   Procedure: HERNIA REPAIR EPIGASTRIC ADULT;  Surgeon: Robert Bellow, MD;  Location: [facility] ORS;  Service: General;  Laterality: N/A;  . EYE SURGERY     bilateral cataract  . HERNIA REPAIR     UMBILICAL HERNIA  . HERNIA REPAIR  02/19/2017  . INNER EAR SURGERY     20 years ago  . KIDNEY TRANSPLANT    . Peritoneal Dialysis access placement  02/19/2017  . REMOVAL OF A DIALYSIS CATHETER N/A 11/23/2017   Procedure: REMOVAL  OF A PERITONEAL DIALYSIS CATHETER;  Surgeon: Algernon Huxley, MD;  Location: [facility] ORS;  Service: Vascular;  Laterality: N/A;  . RIGHT/LEFT HEART CATH AND CORONARY ANGIOGRAPHY Bilateral 06/07/2018   Procedure: RIGHT/LEFT HEART CATH AND CORONARY ANGIOGRAPHY;  Surgeon: Wellington Hampshire, MD;  Location: [facility];  Service: Cardiovascular;  Laterality: Bilateral;  . TEE WITHOUT CARDIOVERSION N/A 07/21/2016   Procedure: TRANSESOPHAGEAL ECHOCARDIOGRAM (TEE);  Surgeon: Wellington Hampshire, MD;  Location: [facility] ORS;  Service: Cardiovascular;  Laterality: N/A;    Family  History  Problem Relation Age of Onset  . Cancer Mother        pancreatic  . Heart disease Father   . Diabetes Father     Social History Social History   Tobacco Use  . Smoking status: Never Smoker  . Smokeless tobacco: Never Used  Substance Use Topics  . Alcohol use: No  . Drug use: No    Current Outpatient Medications  Medication Sig Dispense Refill  . acetaminophen (TYLENOL) 500 MG tablet Take 1,000 mg 2 (two) times daily as needed by mouth for moderate pain.    Marland Kitchen allopurinol (ZYLOPRIM) 300 MG tablet Take 300 mg by mouth daily.  5  . cinacalcet (SENSIPAR) 30 MG tablet Take 30 mg by mouth daily.     Marland Kitchen Dextrose-Fructose-Sod Citrate (NAUZENE PO) Take 1-2 tablets 2 (two) times daily as needed by mouth (nausea).    Marland Kitchen FLUoxetine (PROZAC) 10 MG capsule TAKE 1 CAPSULE BY MOUTH EVERY DAY (Patient taking differently: Take 10 mg by mouth daily. ) 90 capsule 0  . hydroxychloroquine (PLAQUENIL) 200 MG tablet Take 400 mg by mouth at bedtime.    . isosorbide mononitrate (IMDUR) 30 MG 24 hr tablet Take 30 mg by mouth daily.    Marland Kitchen lidocaine-prilocaine (EMLA) cream Apply 1 application as needed topically (port access).    . metoprolol tartrate (LOPRESSOR) 100 MG tablet Take 0.5 tablets (50 mg total) by mouth 2 (two) times daily. 90 tablet 3  . Multiple Vitamin (MULTIVITAMIN WITH MINERALS) TABS tablet Take 1 tablet by mouth daily.    . nitroGLYCERIN (NITROSTAT) 0.4 MG SL tablet Place 1 tablet (0.4 mg total) under the tongue every 5 (five) minutes as needed for chest pain. 75 tablet 0  . omeprazole (PRILOSEC) 20 MG capsule Take 20 mg by mouth daily.    . predniSONE (DELTASONE) 5 MG tablet Take 5 mg by mouth daily with breakfast.     . Probiotic Product (PROBIOTIC ADVANCED PO) Take 1 tablet by mouth daily.    . sevelamer carbonate (RENVELA) 800 MG tablet Take 40,000 mg by mouth 3 (three) times daily with meals. 5 tabs per meal    . warfarin (COUMADIN) 1 MG tablet TAKE 2 TABLETS (2 MG) BY MOUTH DAILY  EXCEPT ON TUESDAY WHEN YOU SHOULD TAKE 1 TABLET (1 MG) BY MOUTH (Patient taking differently: Take 1-2 mg by mouth See admin instructions. 1 mg on Monday and Thurs; 2 mg all other days) 180 tablet 1   No current facility-administered medications for this visit.     Allergies  Allergen Reactions  . Statins     Other reaction(s): Arthralgias (intolerance), Myalgias (intolerance)  . Sertraline Rash    Review of Systems  Constitutional: Positive for fatigue. Negative for activity change and unexpected weight change.  HENT: Positive for hearing loss. Negative for trouble swallowing and voice change.   Eyes: Negative for visual disturbance.  Respiratory: Positive for apnea (CPAP at night).   Cardiovascular:  Positive for chest pain and palpitations (chronic atrial fib). Negative for leg swelling.  Genitourinary: Negative for dysuria.       Renal failure  Musculoskeletal: Positive for arthralgias and joint swelling.  Neurological: Negative for syncope and weakness.  Hematological: Negative for adenopathy. Bruises/bleeds easily (On Coumadin).  Psychiatric/Behavioral: The patient is nervous/anxious.   All other systems reviewed and are negative.   BP 110/63   Pulse 84   Resp 20   Ht 5\' 10"  (1.778 m)   Wt 182 lb (82.6 kg)   SpO2 98% Comment: RA  BMI 26.11 kg/m  Physical Exam Vitals signs reviewed.  Constitutional:      General: He is not in acute distress.    Appearance: Normal appearance.  HENT:     Head: Normocephalic and atraumatic.  Eyes:     General: No scleral icterus.    Extraocular Movements: Extraocular movements intact.     Conjunctiva/sclera: Conjunctivae normal.  Neck:     Musculoskeletal: Normal range of motion and neck supple.  Cardiovascular:     Rate and Rhythm: Normal rate. Rhythm irregular.     Pulses: Normal pulses.     Heart sounds: Murmur (2/6 systolic) present. No gallop.      Comments: Fistula left arm Pulmonary:     Effort: Pulmonary effort is normal.  No respiratory distress.     Breath sounds: Normal breath sounds. No wheezing or rales.  Abdominal:     General: There is no distension.     Palpations: Abdomen is soft.     Tenderness: There is no abdominal tenderness.  Musculoskeletal:        General: No swelling.  Lymphadenopathy:     Cervical: No cervical adenopathy.  Skin:    General: Skin is warm and dry.  Neurological:     General: No focal deficit present.     Mental Status: He is alert and oriented to person, place, and time.     Cranial Nerves: No cranial nerve deficit.     Motor: No weakness.     Coordination: Coordination normal.    Diagnostic Tests: \Cardiac catheterization Conclusion     Prox LAD lesion is 60% stenosed.  Mid LAD-2 lesion is 30% stenosed.  Mid LAD-1 lesion is 95% stenosed.  Mid LAD to Dist LAD lesion is 60% stenosed.  Prox RCA lesion is 40% stenosed.  Mid RCA lesion is 70% stenosed.  Dist RCA lesion is 90% stenosed with 90% stenosed side branch in Post Atrio.  Ost RPDA to RPDA lesion is 95% stenosed.   1.  Severe heavily calcified two-vessel coronary artery disease affecting the LAD and right coronary artery.  The disease is not optimal for atherectomy given significant tortuosity in the proximal LAD segment and diffuse disease affecting the right coronary artery extending into the bifurcation. 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 2.  Right heart catheterization showed mildly elevated filling pressures, moderate pulmonary hypertension and normal cardiac output.  Recommendations: Recommend evaluation for CABG given diffuse heavily calcified disease. Resume warfarin today if no bleeding issues from the groin. Continue aggressive treatment of risk factors.   Echocardiogram 05/04/2018.  Report from [facility]. Left regular ejection fraction 35% Right ventricle mildly dilated Right ventricular systolic  dysfunction is moderately reduced Left atrium is moderately dilated Right atrium is moderately dilated Mild eccentric aortic regurgitation 21 mm St. Jude's bileaflet mechanical valve in aortic position which is well-seated Mild mitral annular  calcification Mild mitral regurgitation Mild tricuspid regurgitation Mild pulmonary hypertension Mild pulmonic valve regurgitation No pericardial effusion  I personally reviewed the catheterization images and concur with the findings noted  Impression: Jeremy [name] is a 63 year old man with a complicated past medical history including an aortic valve replacement for aortic stenosis, PTCA of the LAD for coronary artery disease, end-stage renal disease secondary to IgA nephropathy, renal transplantation, renal cell carcinoma, nephrectomy, end-stage renal disease on hemodialysis, arthritis, hypertension, hyperlipidemia, obstructive sleep apnea, and chronic atrial fibrillation.  He presents with exertional chest pressure.  Work-up shows severe two-vessel coronary disease.  He was not felt to be a candidate for percutaneous intervention.  Coronary artery bypass grafting is indicated for relief of symptoms.  He has a mechanical aortic valve in place and has had a previous partial sternotomy so this would be a redo operation.  He does appear to have adequate targets although the LAD is somewhat limited.  I had a long discussion with Jeremy [name] and his family.  I described the general nature of the procedure.  This would be a redo sternotomy for coronary artery bypass grafting.  We would plan to use the left mammary and saphenous vein.  They understand the need for general anesthesia, the incision to be used, the use of cardiopulmonary bypass, the use of drainage tubes postoperatively, the expected hospital stay, and the overall recovery.  I informed him of the indications, risks, benefits, and alternatives.  They understand this is a high risk procedure due to his  previous surgery, left ventricular dysfunction and renal failure.  They understand the risks include but are not limited to death, MI, stroke, DVT, PE, bleeding, need for transfusion, infection, cardiac arrhythmias, heart block, respiratory failure, gastrointestinal complications.  The understand there is a possibility of unforeseeable complications.  He has chronic atrial fibrillation, but I do not think a Maze procedure would be advisable in this setting given his redo status and need for lifelong anticoagulation with his prosthetic valve.  He is on Coumadin and that would need to be held.  With his renal failure I do not think he would be a candidate for bridging with Lovenox and would likely need to be admitted to the cardiology service for several days for intravenous heparin prior to the procedure.  If he decides to proceed we will discuss the timing of that with Dr. [name].  End-stage renal disease-on hemodialysis.  We will notify nephrology of his admission should he decide to proceed  Plan: Jeremy [name] wishes to think over his options before deciding whether or not to proceed with coronary artery bypass grafting  Melrose Nakayama, MD [facility] [phone]

## 2018-06-10 NOTE — Telephone Encounter (Signed)
I spoke with Joellen Jersey, Software engineer at [facility]. She states that she has received a call from the patient asking if he was supposed to stop his pravastatin.  This was stopped on his medication list at discharge from his cath, but I advised Joellen Jersey that I don't see anything in the chart where he was told to stop this. He is pending consultation for CABG.  Per Joellen Jersey, the patient has been taking this 3 times a week due to some slight muscle discomfort, but was also willing to increase to daily if needed. I advised Joellen Jersey that the patient should stay on his pravastatin 3 times a week as he was doing, but will clarify with Dr. [name] that there was no other change that was supposed to be made with this being discontinued off his medication list.   She is aware I will call her back with any changes and will touch base with the patient if needed.

## 2018-06-11 DIAGNOSIS — N2581 Secondary hyperparathyroidism of renal origin: Secondary | ICD-10-CM | POA: Diagnosis not present

## 2018-06-11 DIAGNOSIS — D631 Anemia in chronic kidney disease: Secondary | ICD-10-CM | POA: Diagnosis not present

## 2018-06-11 DIAGNOSIS — N186 End stage renal disease: Secondary | ICD-10-CM | POA: Diagnosis not present

## 2018-06-11 MED ORDER — PRAVASTATIN SODIUM 10 MG PO TABS: ORAL_TABLET | ORAL | 6 refills | Status: DC

## 2018-06-11 NOTE — Progress Notes (Signed)
Ok, thank you

## 2018-06-11 NOTE — Telephone Encounter (Signed)
Continue pravastatin daily at the previous dose.

## 2018-06-11 NOTE — Telephone Encounter (Signed)
Clarified with Dr. [name], he would like the patient to increase pravastatin from three times a week to daily if he can tolerate this.  I called and spoke with the patient. He is agreeable to trying the pravastatin daily. We will keep him at his current dose of 10 mg. RX sent to [facility] on [address] per his request.   I left a message on Katie's voice mail (pharmacy) to let her know the above information.

## 2018-06-14 ENCOUNTER — Ambulatory Visit (INDEPENDENT_AMBULATORY_CARE_PROVIDER_SITE_OTHER): Payer: Medicare Other | Admitting: Pharmacist

## 2018-06-14 DIAGNOSIS — D631 Anemia in chronic kidney disease: Secondary | ICD-10-CM | POA: Diagnosis not present

## 2018-06-14 DIAGNOSIS — Z7901 Long term (current) use of anticoagulants: Secondary | ICD-10-CM | POA: Diagnosis not present

## 2018-06-14 DIAGNOSIS — I4891 Unspecified atrial fibrillation: Secondary | ICD-10-CM

## 2018-06-14 DIAGNOSIS — N186 End stage renal disease: Secondary | ICD-10-CM | POA: Diagnosis not present

## 2018-06-14 DIAGNOSIS — Z952 Presence of prosthetic heart valve: Secondary | ICD-10-CM

## 2018-06-14 DIAGNOSIS — N2581 Secondary hyperparathyroidism of renal origin: Secondary | ICD-10-CM | POA: Diagnosis not present

## 2018-06-14 LAB — POCT INR
INR: 2.8 (ref 2.0–3.0)
INR: 2.8 — AB (ref <=1.1)

## 2018-06-15 ENCOUNTER — Encounter: Payer: Self-pay | Admitting: Family Medicine

## 2018-06-15 ENCOUNTER — Ambulatory Visit (INDEPENDENT_AMBULATORY_CARE_PROVIDER_SITE_OTHER): Payer: Medicare Other | Admitting: Family Medicine

## 2018-06-15 VITALS — BP 132/78 | HR 104 | Temp 98.6°F | Resp 18 | Ht 70.0 in | Wt 182.2 lb

## 2018-06-15 DIAGNOSIS — B9789 Other viral agents as the cause of diseases classified elsewhere: Secondary | ICD-10-CM | POA: Diagnosis not present

## 2018-06-15 DIAGNOSIS — J069 Acute upper respiratory infection, unspecified: Secondary | ICD-10-CM

## 2018-06-16 DIAGNOSIS — D631 Anemia in chronic kidney disease: Secondary | ICD-10-CM | POA: Diagnosis not present

## 2018-06-16 DIAGNOSIS — N186 End stage renal disease: Secondary | ICD-10-CM | POA: Diagnosis not present

## 2018-06-16 DIAGNOSIS — N2581 Secondary hyperparathyroidism of renal origin: Secondary | ICD-10-CM | POA: Diagnosis not present

## 2018-06-16 LAB — POC INFLUENZA A&B (BINAX/QUICKVUE)
Influenza A, POC: NEGATIVE
Influenza B, POC: NEGATIVE

## 2018-06-16 NOTE — Progress Notes (Signed)
Subjective:    Patient ID: Jeremy [name], male    DOB: 1956-03-15, 63 y.o.   MRN: [id number]  HPI   Patient presents to clinic complaining of cough, sinus congestion, green drainage from nose for 2 days.  He has begun to take Mucinex and Claritin which have helped minimally with symptoms.  Denies fever or chills.  Denies shortness of breath or wheezing.  Denies chest pain.  Denies nausea, vomiting or diarrhea  Patient Active Problem List   Diagnosis Date Noted  . Chronic systolic (congestive) heart failure ([place])   . IBS (irritable bowel syndrome) 03/19/2018  . Abnormal stress test 03/19/2018  . URI (upper respiratory infection) 03/19/2018  . Muscle strain 03/19/2018  . Cut of lower leg 11/17/2017  . Depression 05/17/2017  . AV fistula ([place]) 05/17/2017  . Chronic pain of both knees 05/17/2017  . Arthralgia 05/17/2017  . Rash 05/17/2017  . Osteoarthritis 03/17/2017  . Trigger little finger of right hand 03/17/2017  . 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 Atrial fibrillation ([place]) [I48.91] 10/30/2016  . Ventral hernia 10/14/2016  . History of renal cell carcinoma 08/04/2016  . Renal transplant failure and rejection 07/31/2016  . AKI (acute kidney injury) ([place]) 10/20/2015  . Volume overload 10/20/2015  . Microhematuria 10/01/2015  . Transplant recipient 10/01/2015  . Hypogonadism in male 07/25/2015  . 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 Gout 04/24/2015  . Visit for biopsy 01/03/2015  . Proteinuria 01/03/2015  . Chronic anticoagulation 11/06/2014  . Iron deficiency anemia 11/02/2014  . Rectal bleeding 11/02/2014  . Malignant neoplasm of unspecified kidney, except renal pelvis ([place]) 05/09/2014  . Epigastric hernia 02/08/2014  . Incisional hernia, without obstruction or gangrene 01/30/2014  . 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 Vitamin D deficiency 08/16/2013  . Cervical radicular pain 07/07/2013  . Neck pain 07/07/2013  . Spinal  stenosis 06/20/2013  . OSA (obstructive sleep apnea)   . Edema 02/22/2013  . Immunosuppression ([place]) 02/22/2013  . Hernia of abdominal wall 01/17/2013  . Hypomagnesemia 01/03/2013  . Tremor 12/30/2012  . History of kidney transplant 12/15/2012  . Immunosuppressive management encounter following kidney transplant 12/15/2012  . Kidney transplant status 12/15/2012  . ESRD on hemodialysis ([place]) 12/05/2012  . S/P angioplasty with stent 09/07/2012  . Aortic valve replaced 09/07/2012  . Lumbar radiculopathy 09/06/2012  . ESRD on dialysis ([place]) 07/26/2012  . Hyperlipidemia 07/26/2012  . S/P AVR (aortic valve replacement) 06/23/2012  . Aortic valve disorder 06/14/2012  . Heart failure ([place]) 05/30/2012  . Thyroid nodule 06/26/2011  . Status post percutaneous transluminal coronary angioplasty 05/27/2011  . CAD (coronary artery disease) 05/26/2011  . Essential hypertension 05/19/2011   Social History   Tobacco Use  . Smoking status: Never Smoker  . Smokeless tobacco: Never Used  Substance Use Topics  . Alcohol use: No   Review of Systems   Constitutional: Negative for chills, fatigue and fever.  HENT: +congestion, ear pain, sinus pain. No sore throat.    Eyes: Negative.   Respiratory: +cough. Negative for shortness of breath and wheezing.   Cardiovascular: Negative for chest pain, palpitations and leg swelling.  Gastrointestinal: Negative for abdominal pain, diarrhea, nausea and vomiting.  Genitourinary: Negative for dysuria, frequency and urgency.  Musculoskeletal: Negative for arthralgias and myalgias.  Skin: Negative for color change, pallor and rash.  Neurological: Negative for syncope, light-headedness and headaches.  Psychiatric/Behavioral: The patient is not nervous/anxious.       Objective:  Physical Exam Vitals signs and nursing note reviewed.  Constitutional:      General: He is not in acute distress.    Appearance: He is not toxic-appearing.  HENT:     Head:  Normocephalic and atraumatic.     Ears:     Comments: Mild fullness bilateral TMs    Nose: Congestion and rhinorrhea present.     Mouth/Throat:     Mouth: Mucous membranes are moist.     Pharynx: No oropharyngeal exudate or posterior oropharyngeal erythema.     Comments: +post nasal drip Eyes:     General: No scleral icterus.    Extraocular Movements: Extraocular movements intact.     Pupils: Pupils are equal, round, and reactive to light.  Neck:     Musculoskeletal: Neck supple. No neck rigidity.  Cardiovascular:     Rate and Rhythm: Normal rate and regular rhythm.  Pulmonary:     Effort: Pulmonary effort is normal. No respiratory distress.     Breath sounds: Normal breath sounds.  Lymphadenopathy:     Cervical: No cervical adenopathy.  Skin:    General: Skin is warm and dry.     Coloration: Skin is not pale.  Neurological:     Mental Status: He is alert and oriented to person, place, and time.     Gait: Gait normal.  Psychiatric:        Mood and Affect: Mood normal.        Behavior: Behavior normal.    Vitals:   06/15/18 1458  BP: 132/78  Pulse: (!) 104  Resp: 18  Temp: 98.6 F (37 C)  SpO2: 98%      Assessment & Plan:   Viral URI with cough - patient symptoms of only been present for 2 days.  Point-of-care flu testing is negative.  Patient has had no recent travel or, no contact with anyone with confirmed COVID 19, so he is not suspected to have coronavirus.  I believe his symptoms are related to a viral common cold/URI.  Patient advised to continue taking Claritin and Mucinex as he already has been doing.  Claritin will help dry up congestion, Mucinex will help reduce cough and loosen phlegm.  Educated patient that 2 days worth of sinus congestion and cough do not warrant treatment with antibiotic, also due to him being on Coumadin I would much prefer not to have to put him on antibiotic unless absolutely necessary due to antibiotics causing INR fluctuations.   Patient will keep regularly scheduled follow-up as already planned with PCP.  Advised to return to clinic sooner if any issues arise.

## 2018-06-18 DIAGNOSIS — N2581 Secondary hyperparathyroidism of renal origin: Secondary | ICD-10-CM | POA: Diagnosis not present

## 2018-06-18 DIAGNOSIS — D631 Anemia in chronic kidney disease: Secondary | ICD-10-CM | POA: Diagnosis not present

## 2018-06-18 DIAGNOSIS — N186 End stage renal disease: Secondary | ICD-10-CM | POA: Diagnosis not present

## 2018-06-21 ENCOUNTER — Telehealth: Payer: Self-pay | Admitting: Family Medicine

## 2018-06-21 ENCOUNTER — Ambulatory Visit: Payer: Medicare Other | Admitting: Family Medicine

## 2018-06-21 ENCOUNTER — Ambulatory Visit (INDEPENDENT_AMBULATORY_CARE_PROVIDER_SITE_OTHER): Payer: Medicare Other | Admitting: Pharmacist

## 2018-06-21 DIAGNOSIS — N2581 Secondary hyperparathyroidism of renal origin: Secondary | ICD-10-CM | POA: Diagnosis not present

## 2018-06-21 DIAGNOSIS — I4891 Unspecified atrial fibrillation: Secondary | ICD-10-CM | POA: Diagnosis not present

## 2018-06-21 DIAGNOSIS — D631 Anemia in chronic kidney disease: Secondary | ICD-10-CM | POA: Diagnosis not present

## 2018-06-21 DIAGNOSIS — I4821 Permanent atrial fibrillation: Secondary | ICD-10-CM | POA: Diagnosis not present

## 2018-06-21 DIAGNOSIS — Z952 Presence of prosthetic heart valve: Secondary | ICD-10-CM

## 2018-06-21 DIAGNOSIS — Z7901 Long term (current) use of anticoagulants: Secondary | ICD-10-CM

## 2018-06-21 DIAGNOSIS — N186 End stage renal disease: Secondary | ICD-10-CM | POA: Diagnosis not present

## 2018-06-21 LAB — POCT INR
INR: 4.7 — AB (ref 2.0–3.0)
INR: 4.7 — AB (ref <=1.1)

## 2018-06-21 NOTE — Telephone Encounter (Signed)
Yes I agree instructions from Catie our pharmacist

## 2018-06-21 NOTE — Telephone Encounter (Signed)
Renee with [facility] calling to report INR 4.7. In Epic. Given to [place].

## 2018-06-21 NOTE — Progress Notes (Signed)
I have reviewed the above note and agree. I was available to the pharmacist for consultation.  Tommi Rumps, MD

## 2018-06-21 NOTE — Telephone Encounter (Signed)
Pt's INR is 4.7.  Pt was in for OV this morning w/ Catie, PharmD.  Instructions given to pt per PharmD for Anticoagulation Dose Instructions as of today:  (per 06/21/18 OV note)  HOLD today. Encouraged serving of greens tonight. Decrease to 1 mg Tuesday 3/17, 2 mg Wed 3/18, 1 mg Thurs 3/19, and recheck on Friday, 3/20. Home INR monitor ~0.2 off compared to lab check historically.

## 2018-06-22 NOTE — Progress Notes (Addendum)
Note reviewed  Agree with plan in place as outlined by Catie Darnelle Maffucci PharmD  Philis Nettle FNP

## 2018-06-23 DIAGNOSIS — N186 End stage renal disease: Secondary | ICD-10-CM | POA: Diagnosis not present

## 2018-06-23 DIAGNOSIS — D631 Anemia in chronic kidney disease: Secondary | ICD-10-CM | POA: Diagnosis not present

## 2018-06-23 DIAGNOSIS — N2581 Secondary hyperparathyroidism of renal origin: Secondary | ICD-10-CM | POA: Diagnosis not present

## 2018-06-25 ENCOUNTER — Ambulatory Visit (INDEPENDENT_AMBULATORY_CARE_PROVIDER_SITE_OTHER): Payer: Medicare Other | Admitting: Pharmacist

## 2018-06-25 DIAGNOSIS — N186 End stage renal disease: Secondary | ICD-10-CM | POA: Diagnosis not present

## 2018-06-25 DIAGNOSIS — I4891 Unspecified atrial fibrillation: Secondary | ICD-10-CM | POA: Diagnosis not present

## 2018-06-25 DIAGNOSIS — N2581 Secondary hyperparathyroidism of renal origin: Secondary | ICD-10-CM | POA: Diagnosis not present

## 2018-06-25 DIAGNOSIS — Z952 Presence of prosthetic heart valve: Secondary | ICD-10-CM

## 2018-06-25 DIAGNOSIS — Z7901 Long term (current) use of anticoagulants: Secondary | ICD-10-CM | POA: Diagnosis not present

## 2018-06-25 DIAGNOSIS — D631 Anemia in chronic kidney disease: Secondary | ICD-10-CM | POA: Diagnosis not present

## 2018-06-25 LAB — POCT INR: INR: 2.1 (ref 2.0–3.0)

## 2018-06-26 NOTE — Progress Notes (Signed)
I have reviewed the above note and agree. I was available to the pharmacist for consultation.  Tommi Rumps, MD

## 2018-06-28 ENCOUNTER — Ambulatory Visit (INDEPENDENT_AMBULATORY_CARE_PROVIDER_SITE_OTHER): Payer: Medicare Other | Admitting: Pharmacist

## 2018-06-28 ENCOUNTER — Telehealth: Payer: Self-pay | Admitting: *Deleted

## 2018-06-28 DIAGNOSIS — N2581 Secondary hyperparathyroidism of renal origin: Secondary | ICD-10-CM | POA: Diagnosis not present

## 2018-06-28 DIAGNOSIS — Z952 Presence of prosthetic heart valve: Secondary | ICD-10-CM

## 2018-06-28 DIAGNOSIS — N186 End stage renal disease: Secondary | ICD-10-CM | POA: Diagnosis not present

## 2018-06-28 DIAGNOSIS — Z7901 Long term (current) use of anticoagulants: Secondary | ICD-10-CM

## 2018-06-28 DIAGNOSIS — D631 Anemia in chronic kidney disease: Secondary | ICD-10-CM | POA: Diagnosis not present

## 2018-06-28 DIAGNOSIS — I4891 Unspecified atrial fibrillation: Secondary | ICD-10-CM

## 2018-06-28 LAB — POCT INR
INR: 3.2 — AB (ref 2.0–3.0)
INR: 3.2 — AB (ref <=1.1)

## 2018-06-28 NOTE — Telephone Encounter (Signed)
Patient returning call.

## 2018-06-28 NOTE — Telephone Encounter (Signed)
Patient has been informed that he will be admitted to [facility] on 07/05/2018 for preadmission for his open heart on 07/09/2018. Bed Control will call him that day for admission time.   He has been instructed to hold warfarin one day prior to admission, per Dr. [name].

## 2018-06-28 NOTE — Telephone Encounter (Signed)
Left a message for the patient to call back to discuss pre-admission prior to surgery on 07/09/2018

## 2018-06-28 NOTE — Progress Notes (Signed)
I have reviewed the above note and agree. I was available to the pharmacist for consultation.  Tommi Rumps, MD

## 2018-06-29 ENCOUNTER — Telehealth: Payer: Self-pay | Admitting: *Deleted

## 2018-06-29 NOTE — Telephone Encounter (Signed)
Patient called to cancel Redo CABG surgery with Dr. Roxan Hockey scheduled for 07/09/18 r/t COVID outbreak.  He wants to wait until this slows down and he will call me back once he is ready to re-schedule.  I have let Dr. Roxan Hockey and Dr. [name] know this.  He states he is stable at this time but he will call if something changes.

## 2018-06-30 DIAGNOSIS — D631 Anemia in chronic kidney disease: Secondary | ICD-10-CM | POA: Diagnosis not present

## 2018-06-30 DIAGNOSIS — N186 End stage renal disease: Secondary | ICD-10-CM | POA: Diagnosis not present

## 2018-06-30 DIAGNOSIS — N2581 Secondary hyperparathyroidism of renal origin: Secondary | ICD-10-CM | POA: Diagnosis not present

## 2018-07-01 ENCOUNTER — Encounter (INDEPENDENT_AMBULATORY_CARE_PROVIDER_SITE_OTHER): Payer: Medicare Other

## 2018-07-01 ENCOUNTER — Ambulatory Visit (INDEPENDENT_AMBULATORY_CARE_PROVIDER_SITE_OTHER): Payer: Medicare Other | Admitting: Vascular Surgery

## 2018-07-02 DIAGNOSIS — N2581 Secondary hyperparathyroidism of renal origin: Secondary | ICD-10-CM | POA: Diagnosis not present

## 2018-07-02 DIAGNOSIS — N186 End stage renal disease: Secondary | ICD-10-CM | POA: Diagnosis not present

## 2018-07-02 DIAGNOSIS — D631 Anemia in chronic kidney disease: Secondary | ICD-10-CM | POA: Diagnosis not present

## 2018-07-05 ENCOUNTER — Inpatient Hospital Stay: Admission: RE | Admit: 2018-07-05 | Payer: Medicare Other | Source: Ambulatory Visit | Admitting: Cardiovascular Disease

## 2018-07-05 ENCOUNTER — Ambulatory Visit (INDEPENDENT_AMBULATORY_CARE_PROVIDER_SITE_OTHER): Payer: Medicare Other | Admitting: Pharmacist

## 2018-07-05 DIAGNOSIS — D631 Anemia in chronic kidney disease: Secondary | ICD-10-CM | POA: Diagnosis not present

## 2018-07-05 DIAGNOSIS — Z952 Presence of prosthetic heart valve: Secondary | ICD-10-CM

## 2018-07-05 DIAGNOSIS — Z7901 Long term (current) use of anticoagulants: Secondary | ICD-10-CM

## 2018-07-05 DIAGNOSIS — I4891 Unspecified atrial fibrillation: Secondary | ICD-10-CM

## 2018-07-05 DIAGNOSIS — N2581 Secondary hyperparathyroidism of renal origin: Secondary | ICD-10-CM | POA: Diagnosis not present

## 2018-07-05 DIAGNOSIS — N186 End stage renal disease: Secondary | ICD-10-CM | POA: Diagnosis not present

## 2018-07-05 LAB — POCT INR: INR: 4 — AB (ref 2.0–3.0)

## 2018-07-05 NOTE — Progress Notes (Signed)
I have reviewed the above note and agree. I was available to the pharmacist for consultation.  Tommi Rumps, MD

## 2018-07-06 ENCOUNTER — Telehealth: Payer: Self-pay | Admitting: Cardiovascular Disease

## 2018-07-06 ENCOUNTER — Ambulatory Visit: Payer: Self-pay

## 2018-07-06 ENCOUNTER — Telehealth: Payer: Self-pay | Admitting: Internal Medicine

## 2018-07-06 ENCOUNTER — Other Ambulatory Visit: Payer: Medicare Other

## 2018-07-06 ENCOUNTER — Ambulatory Visit: Payer: Medicare Other | Admitting: Cardiovascular Disease

## 2018-07-06 DIAGNOSIS — Z992 Dependence on renal dialysis: Secondary | ICD-10-CM | POA: Diagnosis not present

## 2018-07-06 DIAGNOSIS — T8610 Unspecified complication of kidney transplant: Secondary | ICD-10-CM | POA: Diagnosis not present

## 2018-07-06 DIAGNOSIS — N186 End stage renal disease: Secondary | ICD-10-CM | POA: Diagnosis not present

## 2018-07-06 NOTE — Telephone Encounter (Signed)
-----   Message from Wellington Hampshire, MD sent at 06/30/2018 11:43 AM EDT ----- I can see him in 1 month and let you know.  [place] scheduling: Please schedule an E-visit in 1 month. Thanks.   ----- Message ----- From: Laury Deep, RN Sent: 06/29/2018   1:28 PM EDT To: Wellington Hampshire, MD  Hey,   This patient called to cancel his srgy with Dr. Roxan Hockey on 4/3 r/t COVID.  He will call back once ready to r/s.  I am not sure if you want to see him back in f.u and let us know once ready to proceed?  Thanks, [employer]

## 2018-07-06 NOTE — Telephone Encounter (Signed)
HOLD today Coumadin s/p AVR INR 4.0 07/05/2018- pt held dose goal INR 2.5-3.5  Some weeks he eats helping of greens not eating as much greens now due to COVID 19 not eating out as much   07/05/2018 informed by Catie  Per pt  Held coumadin dose yesterday  His dose was  2 mg daily except Thursdays do 1 mg   Of note per Caties Note Home INR monitor ~0.2 off compared to lab check historically.    Tuesday 07/06/18 (Dr. [facility] on call) Spoke with pt  Hold coumadin dose today  Reduce dose 2 mg daily except Tues/Thursday 1 mg - reducing dose by ~ 10% for now  Check INR again tomorrow at home before you start your dose before lunchtime pt will check home inr  Check INR next week in 1 week again 07/14/2018  Route to Dr. S/Shelby/Catie pt to call the office tomorrow 07/07/2018 with inr value   -->shelby come see me for coumadin food handout to mail to pt. Thanks

## 2018-07-06 NOTE — Telephone Encounter (Signed)
Please call regarding pt BP Pt c/o BP issue: STAT if pt c/o blurred vision, one-sided weakness or slurred speech  1. What are your last 5 BP readings?  Pt doesn't have exact figures, but ranges 90-100/60-80 2. Are you having any other symptoms (ex. Dizziness, headache, blurred vision, passed out)? dizziness  3. What is your BP issue? low

## 2018-07-06 NOTE — Telephone Encounter (Signed)
Will reach out to schedule in late April .  No openings with [name] at the moment.

## 2018-07-06 NOTE — Telephone Encounter (Signed)
Debbie with [facility] called and left message on [place] 07/05/2018. States she needs to report an out of range INR of 4.0 as of 07/05/2018. Called number back and spoke with [place] who verified INR of 4.0. Called and spoke with Dr Terese Door and reported INR of 4.0.  Phone order given to call pt and instruct him not to take his Coumadin today. Called pt and he  stated he spoke with Katie yesterday and was instructed to  hold Coumadin yesterday then back to regular dosing. Pt informed not to take today's dose. Pt informed that Dr Terese Door will call him at 6 pm.  Pt verbalized understanding.     Reason for Disposition . Lab or radiology calling with CRITICAL test results  Answer Assessment - Initial Assessment Questions 1. REASON FOR CALL or QUESTION: "What is your reason for calling today?" or "How can I best help you?" or "What question do you have that I can help answer?"    Debbie with [facility] called and left message on [facility] mailbox 07/05/2018. States she needs to report an out of range INR of 4.0 as of 07/05/2018. Called number back and spoke with [place] who verified INR of 4.0.  2. CALLER: Document the source of call. (e.g., laboratory, patient).         Debbie with [facility]  Protocols used: PCP CALL - NO TRIAGE-A-AH

## 2018-07-07 DIAGNOSIS — N2581 Secondary hyperparathyroidism of renal origin: Secondary | ICD-10-CM | POA: Diagnosis not present

## 2018-07-07 DIAGNOSIS — N186 End stage renal disease: Secondary | ICD-10-CM | POA: Diagnosis not present

## 2018-07-07 DIAGNOSIS — Z23 Encounter for immunization: Secondary | ICD-10-CM | POA: Diagnosis not present

## 2018-07-07 DIAGNOSIS — D631 Anemia in chronic kidney disease: Secondary | ICD-10-CM | POA: Diagnosis not present

## 2018-07-07 NOTE — Telephone Encounter (Signed)
The patient also sent a patient advice request and this has been forwarded to Dr. [name].

## 2018-07-07 NOTE — Telephone Encounter (Signed)
Sent to PCP to advise 

## 2018-07-07 NOTE — Telephone Encounter (Signed)
Called and spoke with pt. Pt advised and voiced understanding.  

## 2018-07-07 NOTE — Telephone Encounter (Signed)
Meant to send to [place].  Please see my prior message.

## 2018-07-07 NOTE — Telephone Encounter (Signed)
Patient has recently been most stable at 2 mg daily except 1 on Thursdays. On Monday, I had recommended he hold 1 day (Monday) then resume previous dose, since he was only ~0.5 off from goal 2.5-3.5. I believe his recent supratherapeutic readings were related to getting him back to being stable after holding for cath at the beginning of the month, and he is historically very slow to steady state.   Recommend continuing 2 mg daily except 1 mg on Thursday, and I plan to call him on Monday as regularly scheduled. If still supratherapeutic at that time, will likely reduce Monday to 1 mg moving forward.

## 2018-07-07 NOTE — Telephone Encounter (Signed)
Please see Catie's recommendation of continuing 2 mg of Coumadin daily except 1 mg on Thursday.  She will contact the patient on Monday as scheduled.

## 2018-07-07 NOTE — Telephone Encounter (Signed)
Patient should resume his normal dosing. I will forward to Catie to get her input as well.

## 2018-07-07 NOTE — Telephone Encounter (Signed)
Returned the pt call. lmtcb.

## 2018-07-07 NOTE — Telephone Encounter (Signed)
Please follow-up with the patient to see what his INR recheck was. Thanks.

## 2018-07-07 NOTE — Telephone Encounter (Signed)
Called pt at [phone] and left a VM to call back. CRM created and sent to [facility] pool.

## 2018-07-07 NOTE — Telephone Encounter (Signed)
Patient called back to inform Jeremy [name] his INR reading was 2.6 today. Call back number is [phone]

## 2018-07-08 ENCOUNTER — Ambulatory Visit: Payer: Medicare Other | Admitting: Urology

## 2018-07-09 ENCOUNTER — Encounter: Admission: RE | Payer: Self-pay | Source: Ambulatory Visit

## 2018-07-09 DIAGNOSIS — D631 Anemia in chronic kidney disease: Secondary | ICD-10-CM | POA: Diagnosis not present

## 2018-07-09 DIAGNOSIS — N2581 Secondary hyperparathyroidism of renal origin: Secondary | ICD-10-CM | POA: Diagnosis not present

## 2018-07-09 DIAGNOSIS — Z23 Encounter for immunization: Secondary | ICD-10-CM | POA: Diagnosis not present

## 2018-07-09 DIAGNOSIS — N186 End stage renal disease: Secondary | ICD-10-CM | POA: Diagnosis not present

## 2018-07-09 SURGERY — REDO STERNOTOMY
Anesthesia: General | Site: Chest

## 2018-07-12 ENCOUNTER — Ambulatory Visit (INDEPENDENT_AMBULATORY_CARE_PROVIDER_SITE_OTHER): Payer: Medicare Other | Admitting: Pharmacist

## 2018-07-12 DIAGNOSIS — I4891 Unspecified atrial fibrillation: Secondary | ICD-10-CM | POA: Diagnosis not present

## 2018-07-12 DIAGNOSIS — N2581 Secondary hyperparathyroidism of renal origin: Secondary | ICD-10-CM | POA: Diagnosis not present

## 2018-07-12 DIAGNOSIS — Z23 Encounter for immunization: Secondary | ICD-10-CM | POA: Diagnosis not present

## 2018-07-12 DIAGNOSIS — D631 Anemia in chronic kidney disease: Secondary | ICD-10-CM | POA: Diagnosis not present

## 2018-07-12 DIAGNOSIS — Z7901 Long term (current) use of anticoagulants: Secondary | ICD-10-CM | POA: Diagnosis not present

## 2018-07-12 DIAGNOSIS — N186 End stage renal disease: Secondary | ICD-10-CM | POA: Diagnosis not present

## 2018-07-12 DIAGNOSIS — Z952 Presence of prosthetic heart valve: Secondary | ICD-10-CM

## 2018-07-12 LAB — POCT INR
INR: 2.7 (ref 2.0–3.0)
INR: 2.7 — AB (ref <=1.1)

## 2018-07-12 NOTE — Telephone Encounter (Signed)
Dr. [name], responded via MyChart message on 4/1. Will close this encounter.

## 2018-07-13 NOTE — Progress Notes (Signed)
I have reviewed the above note and agree. I was available to the pharmacist for consultation.  Tommi Rumps, MD

## 2018-07-14 ENCOUNTER — Other Ambulatory Visit: Payer: Self-pay | Admitting: Family Medicine

## 2018-07-14 DIAGNOSIS — N186 End stage renal disease: Secondary | ICD-10-CM | POA: Diagnosis not present

## 2018-07-14 DIAGNOSIS — N2581 Secondary hyperparathyroidism of renal origin: Secondary | ICD-10-CM | POA: Diagnosis not present

## 2018-07-14 DIAGNOSIS — D631 Anemia in chronic kidney disease: Secondary | ICD-10-CM | POA: Diagnosis not present

## 2018-07-14 DIAGNOSIS — Z23 Encounter for immunization: Secondary | ICD-10-CM | POA: Diagnosis not present

## 2018-07-16 DIAGNOSIS — D631 Anemia in chronic kidney disease: Secondary | ICD-10-CM | POA: Diagnosis not present

## 2018-07-16 DIAGNOSIS — N186 End stage renal disease: Secondary | ICD-10-CM | POA: Diagnosis not present

## 2018-07-16 DIAGNOSIS — N2581 Secondary hyperparathyroidism of renal origin: Secondary | ICD-10-CM | POA: Diagnosis not present

## 2018-07-16 DIAGNOSIS — Z23 Encounter for immunization: Secondary | ICD-10-CM | POA: Diagnosis not present

## 2018-07-19 ENCOUNTER — Ambulatory Visit (INDEPENDENT_AMBULATORY_CARE_PROVIDER_SITE_OTHER): Payer: Medicare Other | Admitting: Pharmacist

## 2018-07-19 DIAGNOSIS — N2581 Secondary hyperparathyroidism of renal origin: Secondary | ICD-10-CM | POA: Diagnosis not present

## 2018-07-19 DIAGNOSIS — I4821 Permanent atrial fibrillation: Secondary | ICD-10-CM | POA: Diagnosis not present

## 2018-07-19 DIAGNOSIS — Z23 Encounter for immunization: Secondary | ICD-10-CM | POA: Diagnosis not present

## 2018-07-19 DIAGNOSIS — I4891 Unspecified atrial fibrillation: Secondary | ICD-10-CM

## 2018-07-19 DIAGNOSIS — N186 End stage renal disease: Secondary | ICD-10-CM | POA: Diagnosis not present

## 2018-07-19 DIAGNOSIS — Z7901 Long term (current) use of anticoagulants: Secondary | ICD-10-CM

## 2018-07-19 DIAGNOSIS — Z952 Presence of prosthetic heart valve: Secondary | ICD-10-CM

## 2018-07-19 DIAGNOSIS — D631 Anemia in chronic kidney disease: Secondary | ICD-10-CM | POA: Diagnosis not present

## 2018-07-19 LAB — POCT INR
INR: 3.7 — AB (ref 2.0–3.0)
INR: 3.7 — AB (ref <=1.1)

## 2018-07-19 NOTE — Progress Notes (Signed)
I have reviewed the above note and agree. I was available to the pharmacist for consultation.  Tommi Rumps, MD

## 2018-07-21 DIAGNOSIS — D631 Anemia in chronic kidney disease: Secondary | ICD-10-CM | POA: Diagnosis not present

## 2018-07-21 DIAGNOSIS — N186 End stage renal disease: Secondary | ICD-10-CM | POA: Diagnosis not present

## 2018-07-21 DIAGNOSIS — N2581 Secondary hyperparathyroidism of renal origin: Secondary | ICD-10-CM | POA: Diagnosis not present

## 2018-07-21 DIAGNOSIS — Z23 Encounter for immunization: Secondary | ICD-10-CM | POA: Diagnosis not present

## 2018-07-23 DIAGNOSIS — Z23 Encounter for immunization: Secondary | ICD-10-CM | POA: Diagnosis not present

## 2018-07-23 DIAGNOSIS — N2581 Secondary hyperparathyroidism of renal origin: Secondary | ICD-10-CM | POA: Diagnosis not present

## 2018-07-23 DIAGNOSIS — N186 End stage renal disease: Secondary | ICD-10-CM | POA: Diagnosis not present

## 2018-07-23 DIAGNOSIS — D631 Anemia in chronic kidney disease: Secondary | ICD-10-CM | POA: Diagnosis not present

## 2018-07-26 ENCOUNTER — Telehealth: Payer: Self-pay | Admitting: Family Medicine

## 2018-07-26 ENCOUNTER — Ambulatory Visit (INDEPENDENT_AMBULATORY_CARE_PROVIDER_SITE_OTHER): Payer: Medicare Other | Admitting: Pharmacist

## 2018-07-26 DIAGNOSIS — Z952 Presence of prosthetic heart valve: Secondary | ICD-10-CM

## 2018-07-26 DIAGNOSIS — Z7901 Long term (current) use of anticoagulants: Secondary | ICD-10-CM

## 2018-07-26 DIAGNOSIS — Z23 Encounter for immunization: Secondary | ICD-10-CM | POA: Diagnosis not present

## 2018-07-26 DIAGNOSIS — I4891 Unspecified atrial fibrillation: Secondary | ICD-10-CM

## 2018-07-26 DIAGNOSIS — N2581 Secondary hyperparathyroidism of renal origin: Secondary | ICD-10-CM | POA: Diagnosis not present

## 2018-07-26 DIAGNOSIS — D631 Anemia in chronic kidney disease: Secondary | ICD-10-CM | POA: Diagnosis not present

## 2018-07-26 DIAGNOSIS — N186 End stage renal disease: Secondary | ICD-10-CM | POA: Diagnosis not present

## 2018-07-26 LAB — POCT INR
INR: 4.2 — AB (ref 2.0–3.0)
INR: 4.2 — AB (ref <=1.1)

## 2018-07-26 NOTE — Progress Notes (Signed)
I have reviewed the above note and agree. I was available to the pharmacist for consultation.  Tommi Rumps, MD

## 2018-07-26 NOTE — Telephone Encounter (Signed)
Catie already spoke with the patient. Please see the anticoagulation encounter.

## 2018-07-26 NOTE — Telephone Encounter (Signed)
'  Maudie Mercury' with [place] calling to report pt's INR 4.2 this am draw. Called and spoke with Butch Penny. Result in Epic.

## 2018-07-28 DIAGNOSIS — N186 End stage renal disease: Secondary | ICD-10-CM | POA: Diagnosis not present

## 2018-07-28 DIAGNOSIS — D631 Anemia in chronic kidney disease: Secondary | ICD-10-CM | POA: Diagnosis not present

## 2018-07-28 DIAGNOSIS — Z23 Encounter for immunization: Secondary | ICD-10-CM | POA: Diagnosis not present

## 2018-07-28 DIAGNOSIS — N2581 Secondary hyperparathyroidism of renal origin: Secondary | ICD-10-CM | POA: Diagnosis not present

## 2018-07-30 DIAGNOSIS — N2581 Secondary hyperparathyroidism of renal origin: Secondary | ICD-10-CM | POA: Diagnosis not present

## 2018-07-30 DIAGNOSIS — D631 Anemia in chronic kidney disease: Secondary | ICD-10-CM | POA: Diagnosis not present

## 2018-07-30 DIAGNOSIS — N186 End stage renal disease: Secondary | ICD-10-CM | POA: Diagnosis not present

## 2018-07-30 DIAGNOSIS — Z23 Encounter for immunization: Secondary | ICD-10-CM | POA: Diagnosis not present

## 2018-08-02 ENCOUNTER — Ambulatory Visit (INDEPENDENT_AMBULATORY_CARE_PROVIDER_SITE_OTHER): Payer: Medicare Other | Admitting: Pharmacist

## 2018-08-02 DIAGNOSIS — I4891 Unspecified atrial fibrillation: Secondary | ICD-10-CM

## 2018-08-02 DIAGNOSIS — Z7901 Long term (current) use of anticoagulants: Secondary | ICD-10-CM | POA: Diagnosis not present

## 2018-08-02 DIAGNOSIS — Z23 Encounter for immunization: Secondary | ICD-10-CM | POA: Diagnosis not present

## 2018-08-02 DIAGNOSIS — Z952 Presence of prosthetic heart valve: Secondary | ICD-10-CM

## 2018-08-02 DIAGNOSIS — N2581 Secondary hyperparathyroidism of renal origin: Secondary | ICD-10-CM | POA: Diagnosis not present

## 2018-08-02 DIAGNOSIS — D631 Anemia in chronic kidney disease: Secondary | ICD-10-CM | POA: Diagnosis not present

## 2018-08-02 DIAGNOSIS — N186 End stage renal disease: Secondary | ICD-10-CM | POA: Diagnosis not present

## 2018-08-02 LAB — POCT INR: INR: 3.5 — AB (ref 2.0–3.0)

## 2018-08-02 NOTE — Progress Notes (Signed)
Catie Darnelle Maffucci PharmD aware of results. Plan in place for continued coumadin monitoring.  Philis Nettle FNP

## 2018-08-04 DIAGNOSIS — Z23 Encounter for immunization: Secondary | ICD-10-CM | POA: Diagnosis not present

## 2018-08-04 DIAGNOSIS — N186 End stage renal disease: Secondary | ICD-10-CM | POA: Diagnosis not present

## 2018-08-04 DIAGNOSIS — N2581 Secondary hyperparathyroidism of renal origin: Secondary | ICD-10-CM | POA: Diagnosis not present

## 2018-08-04 DIAGNOSIS — D631 Anemia in chronic kidney disease: Secondary | ICD-10-CM | POA: Diagnosis not present

## 2018-08-05 ENCOUNTER — Encounter: Payer: Self-pay | Admitting: Cardiovascular Disease

## 2018-08-05 ENCOUNTER — Telehealth (INDEPENDENT_AMBULATORY_CARE_PROVIDER_SITE_OTHER): Payer: Medicare Other | Admitting: Cardiovascular Disease

## 2018-08-05 ENCOUNTER — Other Ambulatory Visit: Payer: Self-pay

## 2018-08-05 VITALS — BP 88/80 | HR 131 | Temp 98.5°F | Ht 70.0 in | Wt 181.9 lb

## 2018-08-05 DIAGNOSIS — I4819 Other persistent atrial fibrillation: Secondary | ICD-10-CM

## 2018-08-05 DIAGNOSIS — Z992 Dependence on renal dialysis: Secondary | ICD-10-CM | POA: Diagnosis not present

## 2018-08-05 DIAGNOSIS — I25118 Atherosclerotic heart disease of native coronary artery with other forms of angina pectoris: Secondary | ICD-10-CM

## 2018-08-05 DIAGNOSIS — N186 End stage renal disease: Secondary | ICD-10-CM | POA: Diagnosis not present

## 2018-08-05 DIAGNOSIS — I5022 Chronic systolic (congestive) heart failure: Secondary | ICD-10-CM

## 2018-08-05 DIAGNOSIS — T8610 Unspecified complication of kidney transplant: Secondary | ICD-10-CM | POA: Diagnosis not present

## 2018-08-05 MED ORDER — AMIODARONE HCL 200 MG PO TABS: 200.0000 mg | ORAL_TABLET | Freq: Two times a day (BID) | ORAL | 3 refills | Status: DC

## 2018-08-05 MED ORDER — WARFARIN SODIUM 1 MG PO TABS: 1.0000 mg | ORAL_TABLET | Freq: Every day | ORAL | 1 refills | Status: DC

## 2018-08-05 NOTE — Patient Instructions (Addendum)
Medication Instructions:  START Amiodarone 200 mg twice daily CHANGE how you take Warfarin to 1 mg once daily  If you need a refill on your cardiac medications before your next appointment, please call your pharmacy.   Lab work: None.  Testing/Procedures: Your physician has requested that you have an echocardiogram in one week. Echocardiography is a painless test that uses sound waves to create images of your heart. It provides your doctor with information about the size and shape of your heart and how well your heart's chambers and valves are working. You may receive an ultrasound enhancing agent through an IV if needed to better visualize your heart during the echo.This procedure takes approximately one hour. There are no restrictions for this procedure. This will take place at the [facility] clinic.    Follow-Up: At [facility], you and your health needs are our priority.  As part of our continuing mission to provide you with exceptional heart care, we have created designated Provider Care Teams.  These Care Teams include your primary Cardiologist (physician) and Advanced Practice Providers (APPs -  Physician Assistants and Nurse Practitioners) who all work together to provide you with the care you need, when you need it. You will need a follow up virtual appointment in 2 weeks after the Echo has been completed  You may see Kathlyn Sacramento, MD or one of the following Advanced Practice Providers on your designated Care Team:   Murray Hodgkins, NP Christell Faith, PA-C . Marrianne Mood, PA-C

## 2018-08-05 NOTE — Progress Notes (Signed)
Virtual Visit via Video Note   This visit type was conducted due to national recommendations for restrictions regarding the COVID-19 Pandemic (e.g. social distancing) in an effort to limit this patient's exposure and mitigate transmission in our community.  Due to his co-morbid illnesses, this patient is at least at moderate risk for complications without adequate follow up.  This format is felt to be most appropriate for this patient at this time.  All issues noted in this document were discussed and addressed.  A limited physical exam was performed with this format.  Please refer to the patient's chart for his consent to telehealth for [facility].   Evaluation Performed:  Follow-up visit  Date:  08/05/2018   ID:  Jeremy [name], DOB 10-06-1955, MRN [id number]  Patient Location: Home Provider Location: Office  PCP:  Leone Haven, MD  Cardiologist:  Kathlyn Sacramento, MD  Electrophysiologist:  None   Chief Complaint: Hypotension and tachycardia  History of Present Illness:    Jeremy [name] is a 63 y.o. male who was seen today via video visit to discuss concerns regarding hypotension and tachycardia in addition to continued angina. He establish cardiac care with me few months ago and used to follow-up with cardiology at [facility]. 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 Cardiac catheterization in 2013 showed nonobstructive RCA and left circumflex disease.  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  LAD had 85% mid stenosis which was treated with a 2.75 x 28 mm Xience drug-eluting stent. 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  He had an echocardiogram done in January 2020 which showed a drop in ejection fraction from normal to 35%, moderately reduced RV function, moderate biatrial enlargement, [employer] Jude mechanical aortic valve with mean gradient of 21 mmHg, mild mitral regurgitation and mild pulmonary hypertension.  He reported having atrial fibrillation for at least 2 to 3 years with no previous cardioversion.  He was on peritoneal dialysis before and was switched to hemodialysis in August 2019 due to ineffective fluid removal.   The patient reported significant worsening of angina during my initial evaluation. He had a [place] Myoview done in November 2019 at [facility] which was abnormal high risk study with large defect in the inferior, inferoseptal and inferolateral myocardium which was fixed with minimal reversibility.  EF was 30%.  His previous EF was normal.  The patient was placed on Imdur with no improvement in his symptoms. I proceeded with a right and left cardiac catheterization in March which showed severe heavily calcified two-vessel coronary artery disease involving the LAD and right coronary artery which was not optimal for atherectomy given significant tortuosity and diffuse disease.  Right heart catheterization showed mildly elevated filling pressures, moderate pulmonary hypertension and normal cardiac output.  The patient was seen by Dr. Roxan Hockey for consultation regarding CABG which obviously was deemed to be high risk but indicated giving the situation.  The patient was scheduled for surgery but had to be canceled due to COVID-19.  The patient continues to be extremely limited with angina.  In addition, he has been having issues with tachycardia and hypotension especially  on the days of dialysis.  He was started on midodrine.  We had to discontinue isosorbide due to hypotension.  The patient does not have symptoms concerning for COVID-19  infection (fever, chills, cough, or new shortness of breath).    Past Medical History:  Diagnosis Date  . 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 Arthritis   . Chronic anticoagulation 06/2012  . Dialysis patient [facility]) 01 01 2008  . Dysrhythmia    A Fib  . Hx of colonoscopy    2009 by Dr. Laural Golden. Normal except for small submucosal lipoma. No evidence of polyps or colitis.  . Hyperlipidemia   . Hypertension   . Hypogonadism in male 07/25/2015  . Kidney disease    Chronic kidney disease secondary to IgA nephropathy diagnosed when he was 24. 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  Marland Kitchen OSA (obstructive sleep apnea)    No CPAP  . Renal cell carcinoma ([place]) 08/04/2016   Past Surgical History:  Procedure Laterality Date  . AORTIC VALVE REPLACEMENT  3 /11 /2014   At [place]    . CAPD INSERTION N/A 02/19/2017   Procedure: LAPAROSCOPIC INSERTION CONTINUOUS AMBULATORY PERITONEAL DIALYSIS  (CAPD) CATHETER;  Surgeon: Algernon Huxley, MD;  Location: [facility] ORS;  Service: General;  Laterality: N/A;  . CORONARY ANGIOPLASTY WITH STENT PLACEMENT    . DG AV DIALYSIS  SHUNT ACCESS EXIST*L* OR     left arm  . EPIGASTRIC HERNIA REPAIR N/A 02/19/2017   Procedure: HERNIA REPAIR EPIGASTRIC ADULT;  Surgeon: Robert Bellow, MD;  Location: [facility] ORS;  Service: General;  Laterality: N/A;  . EYE SURGERY     bilateral cataract  . HERNIA REPAIR     UMBILICAL HERNIA  . HERNIA REPAIR  02/19/2017  . INNER EAR SURGERY     20 years ago  . KIDNEY TRANSPLANT    . Peritoneal Dialysis access placement  02/19/2017  . REMOVAL OF A DIALYSIS CATHETER N/A 11/23/2017   Procedure: REMOVAL OF A PERITONEAL DIALYSIS CATHETER;  Surgeon: Algernon Huxley, MD;  Location: [facility] ORS;  Service: Vascular;  Laterality: N/A;  . RIGHT/LEFT HEART CATH AND CORONARY ANGIOGRAPHY Bilateral 06/07/2018   Procedure:  RIGHT/LEFT HEART CATH AND CORONARY ANGIOGRAPHY;  Surgeon: Wellington Hampshire, MD;  Location: [place] CV LAB;  Service: Cardiovascular;  Laterality: Bilateral;  . TEE WITHOUT CARDIOVERSION N/A 07/21/2016   Procedure: TRANSESOPHAGEAL ECHOCARDIOGRAM (TEE);  Surgeon: Wellington Hampshire, MD;  Location: [facility] ORS;  Service: Cardiovascular;  Laterality: N/A;     Current Meds  Medication Sig  . acetaminophen (TYLENOL) 500 MG tablet Take 1,000 mg 2 (two) times daily as needed by mouth for moderate pain.  Marland Kitchen allopurinol (ZYLOPRIM) 300 MG tablet Take 300 mg by mouth daily.  . cinacalcet (SENSIPAR) 30 MG tablet Take 30 mg by mouth daily.   Marland Kitchen Dextrose-Fructose-Sod Citrate (NAUZENE PO) Take 1-2 tablets 2 (two) times daily as needed by mouth (nausea).  Marland Kitchen FLUoxetine (PROZAC) 10 MG capsule TAKE 1 CAPSULE BY MOUTH EVERY DAY  . hydroxychloroquine (PLAQUENIL) 200 MG tablet Take 400 mg by mouth at bedtime.  . lidocaine-prilocaine (EMLA) cream Apply 1 application as needed topically (port access).  . metoprolol tartrate (LOPRESSOR) 50 MG tablet Take 25 mg by mouth daily.  . midodrine (PROAMATINE) 5 MG tablet Take 5 mg by mouth. Take on Monday, Wednesday and Friday.  . Multiple  Vitamin (MULTIVITAMIN WITH MINERALS) TABS tablet Take 1 tablet by mouth daily.  . nitroGLYCERIN (NITROSTAT) 0.4 MG SL tablet Place 1 tablet (0.4 mg total) under the tongue every 5 (five) minutes as needed for chest pain.  Marland Kitchen omeprazole (PRILOSEC) 20 MG capsule Take 20 mg by mouth daily.  . pravastatin (PRAVACHOL) 10 MG tablet Take 1 tablet (10 mg) by mouth once daily  . predniSONE (DELTASONE) 5 MG tablet Take 5 mg by mouth daily with breakfast.   . Probiotic Product (PROBIOTIC ADVANCED PO) Take 1 tablet by mouth daily.  . sevelamer carbonate (RENVELA) 800 MG tablet Take 40,000 mg by mouth 3 (three) times daily with meals. 5 tabs per meal  . warfarin (COUMADIN) 1 MG tablet Take 1 tablet (1 mg total) by mouth daily at 6 PM.  . [DISCONTINUED]  warfarin (COUMADIN) 1 MG tablet TAKE 2 TABLETS (2 MG) BY MOUTH DAILY EXCEPT ON TUESDAY WHEN YOU SHOULD TAKE 1 TABLET (1 MG) BY MOUTH (Patient taking differently: Take 1-2 mg by mouth See admin instructions. 1 mg on Monday and Thurs; 2 mg all other days)     Allergies:   Statins and Sertraline   Social History   Tobacco Use  . Smoking status: Never Smoker  . Smokeless tobacco: Never Used  Substance Use Topics  . Alcohol use: No  . Drug use: No     Family Hx: The patient's family history includes Cancer in his mother; Diabetes in his father; Heart disease in his father.  ROS:   Please see the history of present illness.     All other systems reviewed and are negative.   Prior CV studies:   The following studies were reviewed today:  Reviewed cardiac cath with him  Labs/Other Tests and Data Reviewed:    EKG:  No ECG reviewed.  Recent Labs: 08/11/2017: ALT 10 06/02/2018: BUN 14; Creatinine, Ser 3.45; Hemoglobin 11.2; Platelets 128; Potassium 3.9; Sodium 140   Recent Lipid Panel Lab Results  Component Value Date/Time   CHOL 176 08/08/2016 09:46 AM   TRIG 158.0 (H) 08/08/2016 09:46 AM   HDL 50.50 08/08/2016 09:46 AM   CHOLHDL 3 08/08/2016 09:46 AM   LDLCALC 94 08/08/2016 09:46 AM   LDLDIRECT 61.0 08/11/2017 02:47 PM    Wt Readings from Last 3 Encounters:  08/05/18 181 lb 14.1 oz (82.5 kg)  06/15/18 182 lb 3.2 oz (82.6 kg)  06/10/18 182 lb (82.6 kg)     Objective:    Vital Signs:  BP (!) 88/80   Pulse (!) 131   Temp 98.5 F (36.9 C)   Ht 5\' 10"  (1.778 m)   Wt 181 lb 14.1 oz (82.5 kg)   SpO2 98%   BMI 26.10 kg/m    VITAL SIGNS:  reviewed GEN:  no acute distress EYES:  sclerae anicteric, EOMI - Extraocular Movements Intact RESPIRATORY:  normal respiratory effort, symmetric expansion CARDIOVASCULAR:  no peripheral edema SKIN:  no rash, lesions or ulcers. MUSCULOSKELETAL:  no obvious deformities. NEURO:  alert and oriented x 3, no obvious focal deficit  PSYCH:  normal affect  ASSESSMENT & PLAN:    1.  Coronary artery disease involving native coronary arteries with  class III angina:  The patient has severe two-vessel coronary artery disease with plans for CABG in the near future.  He will need to be admitted to the hospital to be started on a heparin drip once warfarin is discontinued.  We also need to optimize his hemodynamics before surgery.  2.  Chronic systolic heart failure due to ischemic cardiomyopathy: Not able to increase metoprolol or add an ACE inhibitor or ARB due to low blood pressure.   3.    Persistent atrial fibrillation: He is now having intermittent tachycardia with hypotension during dialysis.  This is very difficult to manage given that I cannot increase metoprolol.  In addition, digoxin is not an option given his renal failure. It might be worth to try to attempt rhythm control before CABG as this will improve his hemodynamics.  Obviously, this might not be achievable given that he has been in atrial fibrillation for more than a year.  Nonetheless, I think it is worth trying. I elected to start him on amiodarone 200 mg twice daily and will arrange for an echocardiogram to evaluate atrial size.  The plan is to proceed with cardioversion in 2 to 3 weeks from now. Given the interaction with warfarin, I decreased warfarin to 1 mg daily and will send a note to Dr. Caryl Bis about this given that he is managing his anticoagulation.  The patient will get INR done on Monday.  4.  Hyperlipidemia: Intolerance to statins due to myalgia.  He is currently on pravastatin.   COVID-19 Education: The signs and symptoms of COVID-19 were discussed with the patient and how to seek care for testing (follow up with PCP or arrange E-visit).  The importance of social distancing was discussed today.  Time:   Today, I have spent 30 minutes with the patient with telehealth technology discussing the above problems.     Medication  Adjustments/Labs and Tests Ordered: Current medicines are reviewed at length with the patient today.  Concerns regarding medicines are outlined above.   Tests Ordered: Orders Placed This Encounter  Procedures  . ECHOCARDIOGRAM COMPLETE    Medication Changes: Meds ordered this encounter  Medications  . amiodarone (PACERONE) 200 MG tablet    Sig: Take 1 tablet (200 mg total) by mouth 2 (two) times daily.    Dispense:  180 tablet    Refill:  3  . warfarin (COUMADIN) 1 MG tablet    Sig: Take 1 tablet (1 mg total) by mouth daily at 6 PM.    Dispense:  90 tablet    Refill:  1    Disposition:  Follow up in 3 week(s)  Signed, Kathlyn Sacramento, MD  08/05/2018 3:33 PM    [place]

## 2018-08-06 ENCOUNTER — Telehealth: Payer: Self-pay | Admitting: Cardiovascular Disease

## 2018-08-06 DIAGNOSIS — N186 End stage renal disease: Secondary | ICD-10-CM | POA: Diagnosis not present

## 2018-08-06 DIAGNOSIS — N2581 Secondary hyperparathyroidism of renal origin: Secondary | ICD-10-CM | POA: Diagnosis not present

## 2018-08-06 DIAGNOSIS — Z23 Encounter for immunization: Secondary | ICD-10-CM | POA: Diagnosis not present

## 2018-08-06 NOTE — Telephone Encounter (Signed)
Patient calling Pt c/o medication issue:  1. Name of Medication: amiodarone   2. How are you currently taking this medication (dosage and times per day)? 200 MG 1 tablet 2 times daily   3. Are you having a reaction (difficulty breathing--STAT)? no  4. What is your medication issue? Patient calling states that the new medication amiodarone may interact with hydroxychloroquine and would like to discuss what to do.  Please call to discuss.

## 2018-08-06 NOTE — Telephone Encounter (Signed)
This was already addressed by Dr. [name] [facility] and a message sent back to the patient via [place].   Will close this encounter and await his response via MyChart.

## 2018-08-07 ENCOUNTER — Other Ambulatory Visit: Payer: Self-pay | Admitting: Family Medicine

## 2018-08-09 ENCOUNTER — Ambulatory Visit (INDEPENDENT_AMBULATORY_CARE_PROVIDER_SITE_OTHER): Payer: Medicare Other | Admitting: Pharmacist

## 2018-08-09 DIAGNOSIS — I4891 Unspecified atrial fibrillation: Secondary | ICD-10-CM | POA: Diagnosis not present

## 2018-08-09 DIAGNOSIS — Z952 Presence of prosthetic heart valve: Secondary | ICD-10-CM

## 2018-08-09 DIAGNOSIS — N186 End stage renal disease: Secondary | ICD-10-CM | POA: Diagnosis not present

## 2018-08-09 DIAGNOSIS — Z23 Encounter for immunization: Secondary | ICD-10-CM | POA: Diagnosis not present

## 2018-08-09 DIAGNOSIS — Z7901 Long term (current) use of anticoagulants: Secondary | ICD-10-CM | POA: Diagnosis not present

## 2018-08-09 DIAGNOSIS — N2581 Secondary hyperparathyroidism of renal origin: Secondary | ICD-10-CM | POA: Diagnosis not present

## 2018-08-09 LAB — POCT INR
INR: 4.9 — AB (ref 2.0–3.0)
INR: 4.9 — AB (ref <=1.1)

## 2018-08-09 NOTE — Telephone Encounter (Signed)
I spoke with Jeremy [name] at [facility] [address] [facility] and advised him that per Dr. [name], we have no alternative for the patient aside from amiodarone given his CHF/ renal function.  I advised Jeremy [name], that we had sent a message back to the patient stating that we don't have an alternative for his amiodarone, but he is advised to check with whomever prescribed his plaquenil to see if there is an alternative there.   Jeremy [name] states he will fill the patient's amiodarone and speak with him further when he picks up this RX about speaking with the prescribing MD for plaquenil as to there being an alternative there.

## 2018-08-09 NOTE — Telephone Encounter (Signed)
Patient calling States the pharmacy will need a verbal or written message about the medication if he needs to take  Please advise

## 2018-08-09 NOTE — Progress Notes (Signed)
I have reviewed the above note and agree. I was available to the pharmacist for consultation.  Tommi Rumps, MD

## 2018-08-11 ENCOUNTER — Ambulatory Visit (INDEPENDENT_AMBULATORY_CARE_PROVIDER_SITE_OTHER): Payer: Medicare Other | Admitting: Pharmacist

## 2018-08-11 DIAGNOSIS — Z952 Presence of prosthetic heart valve: Secondary | ICD-10-CM

## 2018-08-11 DIAGNOSIS — Z7901 Long term (current) use of anticoagulants: Secondary | ICD-10-CM | POA: Diagnosis not present

## 2018-08-11 DIAGNOSIS — N2581 Secondary hyperparathyroidism of renal origin: Secondary | ICD-10-CM | POA: Diagnosis not present

## 2018-08-11 DIAGNOSIS — Z23 Encounter for immunization: Secondary | ICD-10-CM | POA: Diagnosis not present

## 2018-08-11 DIAGNOSIS — N186 End stage renal disease: Secondary | ICD-10-CM | POA: Diagnosis not present

## 2018-08-11 DIAGNOSIS — I4891 Unspecified atrial fibrillation: Secondary | ICD-10-CM

## 2018-08-11 LAB — POCT INR: INR: 2.8 (ref 2.0–3.0)

## 2018-08-12 ENCOUNTER — Other Ambulatory Visit: Payer: Self-pay

## 2018-08-12 ENCOUNTER — Ambulatory Visit (INDEPENDENT_AMBULATORY_CARE_PROVIDER_SITE_OTHER): Payer: Medicare Other

## 2018-08-12 DIAGNOSIS — I4819 Other persistent atrial fibrillation: Secondary | ICD-10-CM | POA: Diagnosis not present

## 2018-08-12 DIAGNOSIS — I5022 Chronic systolic (congestive) heart failure: Secondary | ICD-10-CM | POA: Diagnosis not present

## 2018-08-13 ENCOUNTER — Ambulatory Visit: Payer: Medicare Other | Admitting: Family Medicine

## 2018-08-13 ENCOUNTER — Encounter: Payer: Self-pay | Admitting: Family Medicine

## 2018-08-13 ENCOUNTER — Telehealth: Payer: Self-pay

## 2018-08-13 ENCOUNTER — Ambulatory Visit (INDEPENDENT_AMBULATORY_CARE_PROVIDER_SITE_OTHER): Payer: Medicare Other | Admitting: Family Medicine

## 2018-08-13 DIAGNOSIS — N186 End stage renal disease: Secondary | ICD-10-CM | POA: Diagnosis not present

## 2018-08-13 DIAGNOSIS — N4889 Other specified disorders of penis: Secondary | ICD-10-CM

## 2018-08-13 DIAGNOSIS — I2510 Atherosclerotic heart disease of native coronary artery without angina pectoris: Secondary | ICD-10-CM

## 2018-08-13 DIAGNOSIS — Z23 Encounter for immunization: Secondary | ICD-10-CM | POA: Diagnosis not present

## 2018-08-13 DIAGNOSIS — N2581 Secondary hyperparathyroidism of renal origin: Secondary | ICD-10-CM | POA: Diagnosis not present

## 2018-08-13 NOTE — Telephone Encounter (Signed)
Noted. We will do the virtual visit this afternoon.

## 2018-08-13 NOTE — Progress Notes (Signed)
Pt is c/o UTi symptoms started a few day ago though it was getting better yesterday per pt but then it start to become painful again. Urgency to urinate but can not void feels very painful. No fever no cold chills. Slight lower back pain. Not taking anything OTC to treat his symptoms.

## 2018-08-13 NOTE — Progress Notes (Signed)
Virtual Visit via video Note  This visit type was conducted due to national recommendations for restrictions regarding the COVID-19 pandemic (e.g. social distancing).  This format is felt to be most appropriate for this patient at this time.  All issues noted in this document were discussed and addressed.  No physical exam was performed (except for noted visual exam findings with Video Visits).   I connected with Jeremy [name] today at  3:15 PM EDT by a video enabled telemedicine application and verified that I am speaking with the correct person using two identifiers. Location patient: home Location provider: work  Persons participating in the virtual visit: patient, provider, Elijio Miles (girlfriend)  I discussed the limitations, risks, security and privacy concerns of performing an evaluation and management service by telephone and the availability of in person appointments. I also discussed with the patient that there may be a patient responsible charge related to this service. The patient expressed understanding and agreed to proceed.  Reason for visit: same day visit  HPI: Urinary urgency: Patient notes 2 days ago he developed a sudden urge to urinate.  Notes that improved yesterday though again he had the urgency today.  He notes a burning sensation in his penis.  He does not urinate as he is end-stage renal disease and on dialysis.  He has not had urine production since 2007.  He notes no fevers.  He notes a pressure sensation in his lower abdomen though no pain.  He does note an occasional discharge from his penis that will be in the toilet when he goes to the bathroom.  He notes this has been going on for some time.  He notes he has not been sexually active in months.  He does report a history of a UTI several years after he went on dialysis.  He notes he was not producing urine at that time either.   ROS: See pertinent positives and negatives per HPI.  Past Medical History:   Diagnosis Date  . 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 Arthritis   . Chronic anticoagulation 06/2012  . Dialysis patient [facility]) 01 01 2008  . Dysrhythmia    A Fib  . Hx of colonoscopy    2009 by Dr. Laural Golden. Normal except for small submucosal lipoma. No evidence of polyps or colitis.  . Hyperlipidemia   . Hypertension   . Hypogonadism in male 07/25/2015  . 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  Marland Kitchen OSA (obstructive sleep apnea)    No CPAP  . Renal cell carcinoma ([place]) 08/04/2016    Past Surgical History:  Procedure Laterality Date  . 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 CAPD INSERTION N/A 02/19/2017   Procedure: LAPAROSCOPIC INSERTION CONTINUOUS AMBULATORY PERITONEAL DIALYSIS  (CAPD) CATHETER;  Surgeon: Algernon Huxley, MD;  Location: [facility] ORS;  Service: General;  Laterality: N/A;  . CORONARY ANGIOPLASTY WITH STENT PLACEMENT    . DG AV DIALYSIS  SHUNT ACCESS EXIST*L* OR     left arm  . EPIGASTRIC HERNIA REPAIR N/A 02/19/2017   Procedure: HERNIA REPAIR EPIGASTRIC ADULT;  Surgeon: Robert Bellow, MD;  Location: [facility] ORS;  Service: General;  Laterality: N/A;  . EYE SURGERY     bilateral  cataract  . HERNIA REPAIR     UMBILICAL HERNIA  . HERNIA REPAIR  02/19/2017  . INNER EAR SURGERY     20 years ago  . KIDNEY TRANSPLANT    . Peritoneal Dialysis access placement  02/19/2017  . REMOVAL OF A DIALYSIS CATHETER N/A 11/23/2017   Procedure: REMOVAL OF A PERITONEAL DIALYSIS CATHETER;  Surgeon: Algernon Huxley, MD;  Location: [facility] ORS;  Service: Vascular;  Laterality: N/A;  . RIGHT/LEFT HEART CATH AND CORONARY ANGIOGRAPHY Bilateral 06/07/2018   Procedure: RIGHT/LEFT HEART CATH AND CORONARY ANGIOGRAPHY;  Surgeon: Wellington Hampshire, MD;  Location:  [place] [facility];  Service: Cardiovascular;  Laterality: Bilateral;  . TEE WITHOUT CARDIOVERSION N/A 07/21/2016   Procedure: TRANSESOPHAGEAL ECHOCARDIOGRAM (TEE);  Surgeon: Wellington Hampshire, MD;  Location: [facility] ORS;  Service: Cardiovascular;  Laterality: N/A;    Family History  Problem Relation Age of Onset  . Cancer Mother        pancreatic  . Heart disease Father   . Diabetes Father     SOCIAL HX: Non-smoker.   Current Outpatient Medications:  .  acetaminophen (TYLENOL) 500 MG tablet, Take 1,000 mg 2 (two) times daily as needed by mouth for moderate pain., Disp: , Rfl:  .  allopurinol (ZYLOPRIM) 300 MG tablet, Take 300 mg by mouth daily., Disp: , Rfl: 5 .  amiodarone (PACERONE) 200 MG tablet, Take 1 tablet (200 mg total) by mouth 2 (two) times daily., Disp: 180 tablet, Rfl: 3 .  cinacalcet (SENSIPAR) 30 MG tablet, Take 30 mg by mouth daily. , Disp: , Rfl:  .  Dextrose-Fructose-Sod Citrate (NAUZENE PO), Take 1-2 tablets 2 (two) times daily as needed by mouth (nausea)., Disp: , Rfl:  .  FLUoxetine (PROZAC) 10 MG capsule, TAKE 1 CAPSULE BY MOUTH EVERY DAY, Disp: 90 capsule, Rfl: 0 .  hydroxychloroquine (PLAQUENIL) 200 MG tablet, Take 400 mg by mouth at bedtime., Disp: , Rfl:  .  lidocaine-prilocaine (EMLA) cream, Apply 1 application as needed topically (port access)., Disp: , Rfl:  .  metoprolol tartrate (LOPRESSOR) 50 MG tablet, Take 25 mg by mouth daily., Disp: , Rfl:  .  midodrine (PROAMATINE) 5 MG tablet, Take 5 mg by mouth. Take on Monday, Wednesday and Friday., Disp: , Rfl:  .  Multiple Vitamin (MULTIVITAMIN WITH MINERALS) TABS tablet, Take 1 tablet by mouth daily., Disp: , Rfl:  .  nitroGLYCERIN (NITROSTAT) 0.4 MG SL tablet, PLACE 1 TABLET (0.4 MG TOTAL) UNDER THE TONGUE EVERY 5 (FIVE) MINUTES AS NEEDED FOR CHEST PAIN., Disp: 25 tablet, Rfl: 2 .  omeprazole (PRILOSEC) 20 MG capsule, Take 20 mg by mouth daily., Disp: , Rfl:  .  pravastatin (PRAVACHOL) 10 MG tablet, Take 1 tablet  (10 mg) by mouth once daily, Disp: 30 tablet, Rfl: 6 .  predniSONE (DELTASONE) 5 MG tablet, Take 5 mg by mouth daily with breakfast. , Disp: , Rfl:  .  Probiotic Product (PROBIOTIC ADVANCED PO), Take 1 tablet by mouth daily., Disp: , Rfl:  .  sevelamer carbonate (RENVELA) 800 MG tablet, Take 40,000 mg by mouth 3 (three) times daily with meals. 5 tabs per meal, Disp: , Rfl:  .  warfarin (COUMADIN) 1 MG tablet, Take 1 tablet (1 mg total) by mouth daily at 6 PM., Disp: 90 tablet, Rfl: 1 .  cephALEXin (KEFLEX) 250 MG capsule, Take 1 capsule (250 mg total) by mouth 2 (two) times daily., Disp: 14 capsule, Rfl: 0  EXAM:  VITALS per patient if applicable: None.  GENERAL: alert, oriented, appears well and in no acute distress  HEENT: atraumatic, conjunttiva clear, no obvious abnormalities on inspection of external nose and ears  NECK: normal movements of the head and neck  LUNGS: on inspection no signs of respiratory distress, breathing rate appears normal, no obvious gross SOB, gasping or wheezing  CV: no obvious cyanosis  MS: moves all visible extremities without noticeable abnormality  PSYCH/NEURO: pleasant and cooperative, no obvious depression or anxiety, speech and thought processing grossly intact  ASSESSMENT AND PLAN:  Discussed the following assessment and plan:  Penile pain - Plan: Urine cytology ancillary only([place]), POCT urinalysis dipstick  Penile pain Patient notes burning discomfort in his penis.  He notes this feels like when he has had a UTI previously though not as severe.  He does have some discharge.  I discussed that this could represent a UTI though with a discharge it could also represent another type of infection such as an STI or other infection.  Discussed that the only way to determine between these 2 would be to collect a sample.  Given his lack of urination I discussed that a swab would be more ideal and he would need to come into the office to have this  completed.  He was hesitant to have this done and would prefer to try to collect it as a urine test.  He will come to pick up the collection materials.  I discussed that he could not collect it until Monday and then he must drop it off on Monday.  If he is not able to collect anything on Monday he will be on the schedule for a visit to do a swab collection.  We additionally discussed empirically treating for UTI and I will send in Keflex for the patient to take.  ESRD dosing reviewed on up-to-date.    Social distancing precautions and sick precautions given regarding COVID-19.  I discussed the assessment and treatment plan with the patient. The patient was provided an opportunity to ask questions and all were answered. The patient agreed with the plan and demonstrated an understanding of the instructions.   The patient was advised to call back or seek an in-person evaluation if the symptoms worsen or if the condition fails to improve as anticipated.   Tommi Rumps, MD

## 2018-08-13 NOTE — Telephone Encounter (Signed)
Copied from [place] [id number]. Topic: Appointment Scheduling - Scheduling Inquiry for Clinic >> Aug 13, 2018 10:26 AM Scherrie Gerlach wrote: Reason for CRM: pt states he has a UTI.  He does not urinate like other, but has the pressure. Pt just left dialysis.would like to speak with Dr Josephina Gip Pt able to do virtual visit. No answer at the [place] office Called pt and scheduled a virtual visit for today with PCP.  Nina,cma

## 2018-08-14 DIAGNOSIS — N4889 Other specified disorders of penis: Secondary | ICD-10-CM | POA: Insufficient documentation

## 2018-08-14 MED ORDER — CEPHALEXIN 250 MG PO CAPS: 250.0000 mg | ORAL_CAPSULE | Freq: Two times a day (BID) | ORAL | 0 refills | Status: DC

## 2018-08-14 NOTE — Assessment & Plan Note (Signed)
Patient notes burning discomfort in his penis.  He notes this feels like when he has had a UTI previously though not as severe.  He does have some discharge.  I discussed that this could represent a UTI though with a discharge it could also represent another type of infection such as an STI or other infection.  Discussed that the only way to determine between these 2 would be to collect a sample.  Given his lack of urination I discussed that a swab would be more ideal and he would need to come into the office to have this completed.  He was hesitant to have this done and would prefer to try to collect it as a urine test.  He will come to pick up the collection materials.  I discussed that he could not collect it until Monday and then he must drop it off on Monday.  If he is not able to collect anything on Monday he will be on the schedule for a visit to do a swab collection.  We additionally discussed empirically treating for UTI and I will send in Keflex for the patient to take.  ESRD dosing reviewed on up-to-date.

## 2018-08-16 ENCOUNTER — Ambulatory Visit: Payer: Self-pay | Admitting: Pharmacist

## 2018-08-16 ENCOUNTER — Telehealth: Payer: Self-pay

## 2018-08-16 ENCOUNTER — Other Ambulatory Visit: Payer: Self-pay

## 2018-08-16 ENCOUNTER — Other Ambulatory Visit (HOSPITAL_COMMUNITY)
Admission: RE | Admit: 2018-08-16 | Discharge: 2018-08-16 | Disposition: A | Discharge status: Home / Self Care | Payer: Medicare Other | Source: Ambulatory Visit | Attending: Family Medicine | Admitting: Family Medicine

## 2018-08-16 ENCOUNTER — Ambulatory Visit (INDEPENDENT_AMBULATORY_CARE_PROVIDER_SITE_OTHER): Payer: Medicare Other | Admitting: Family Medicine

## 2018-08-16 VITALS — BP 110/58 | HR 89 | Temp 97.8°F | Wt 187.4 lb

## 2018-08-16 DIAGNOSIS — N4889 Other specified disorders of penis: Secondary | ICD-10-CM | POA: Diagnosis not present

## 2018-08-16 DIAGNOSIS — Z23 Encounter for immunization: Secondary | ICD-10-CM | POA: Diagnosis not present

## 2018-08-16 DIAGNOSIS — I4821 Permanent atrial fibrillation: Secondary | ICD-10-CM | POA: Diagnosis not present

## 2018-08-16 DIAGNOSIS — N433 Hydrocele, unspecified: Secondary | ICD-10-CM | POA: Diagnosis not present

## 2018-08-16 DIAGNOSIS — Z7901 Long term (current) use of anticoagulants: Secondary | ICD-10-CM

## 2018-08-16 DIAGNOSIS — I2510 Atherosclerotic heart disease of native coronary artery without angina pectoris: Secondary | ICD-10-CM | POA: Diagnosis not present

## 2018-08-16 DIAGNOSIS — N2581 Secondary hyperparathyroidism of renal origin: Secondary | ICD-10-CM | POA: Diagnosis not present

## 2018-08-16 DIAGNOSIS — R369 Urethral discharge, unspecified: Secondary | ICD-10-CM | POA: Diagnosis not present

## 2018-08-16 DIAGNOSIS — Z952 Presence of prosthetic heart valve: Secondary | ICD-10-CM

## 2018-08-16 DIAGNOSIS — N186 End stage renal disease: Secondary | ICD-10-CM | POA: Diagnosis not present

## 2018-08-16 DIAGNOSIS — I4891 Unspecified atrial fibrillation: Secondary | ICD-10-CM

## 2018-08-16 LAB — POCT INR
INR: 1.6 — AB (ref 2.0–3.0)
INR: 1.6 — AB (ref <=1.1)

## 2018-08-16 NOTE — Progress Notes (Signed)
I have reviewed the above note and agree. I was available to the pharmacist for consultation.  Tommi Rumps, MD

## 2018-08-16 NOTE — Assessment & Plan Note (Signed)
Patient to follow-up with urology.

## 2018-08-16 NOTE — Assessment & Plan Note (Addendum)
No pain today though he does report discharge.  He did bring in a sample mucousy with a tan appearance.  It does not appear consistent with pus.  We will send this for gonorrhea, chlamydia, and trichomonas testing though it would seem unlikely to be related to these.  This could represent a UTI and let him on Keflex which he will finish.  We will send a message to his urologist to get their input.  Discussed HIV screening given that we are testing for STD is no different this and had a negative test about a year ago.

## 2018-08-16 NOTE — Progress Notes (Signed)
  Tommi Rumps, MD Phone: [phone]  Jeremy [name] is a 63 y.o. male who presents today for follow-up.    Penile discharge: notes this improved over the weekend though recurred today after dialysis. Notes it is pus like in the toilet and sticks to the edges. He notes this has been occurring since at least October of last year. He was seen by urology at that time it appears they advised that this may be related to sloughing of cells in his bladder given that he does not produce urine.  He reports he has not been sexually active in many years.  He reports no history of STDs.  He reports a prior negative HIV screening test about a year ago.  He wonders if dialysis is triggering this.  He notes no dysuria.  He does note and he feels as though he needs to urinate.  He does note he was seen for enlargement of his scrotum and was noted to have a left hydrocele.  He notes this may have worsened to some degree since October 2019.  Social History   Tobacco Use  Smoking Status Never Smoker  Smokeless Tobacco Never Used     ROS see history of present illness  Objective  Physical Exam Vitals:   08/16/18 1546  BP: (!) 110/58  Pulse: 89  Temp: 97.8 F (36.6 C)  SpO2: 98%    BP Readings from Last 3 Encounters:  08/16/18 (!) 110/58  08/05/18 (!) 88/80  06/15/18 132/78   Wt Readings from Last 3 Encounters:  08/16/18 187 lb 6.4 oz (85 kg)  08/05/18 181 lb 14.1 oz (82.5 kg)  06/15/18 182 lb 3.2 oz (82.6 kg)    Physical Exam Constitutional:      General: He is not in acute distress.    Appearance: He is not diaphoretic.  Pulmonary:     Effort: Pulmonary effort is normal.  Genitourinary:    Comments: Normal circumcised penis with no discharge noted, no urethral abnormality, urethra is patent, left scrotum with enlargement that feels consistent with a hydrocele, no apparent testicular abnormalities Skin:    General: Skin is warm and dry.  Neurological:     Mental Status: He is alert.       Assessment/Plan: Please see individual problem list.  Penile pain No pain today though he does report discharge.  He did bring in a sample mucousy with a tan appearance.  It does not appear consistent with pus.  We will send this for gonorrhea, chlamydia, and trichomonas testing though it would seem unlikely to be related to these.  This could represent a UTI and let him on Keflex which he will finish.  We will send a message to his urologist to get their input.  Discussed HIV screening given that we are testing for STD is no different this and had a negative test about a year ago.  Hydrocele Patient to follow-up with urology.   No orders of the defined types were placed in this encounter.   No orders of the defined types were placed in this encounter.    Tommi Rumps, MD [place]

## 2018-08-16 NOTE — Patient Instructions (Signed)
Nice to see you. Please finish the biotics. We will send a message to your urologist to get their input.

## 2018-08-16 NOTE — Telephone Encounter (Signed)
Jeremy [name] spoke with the patient already today. Please see her note.

## 2018-08-17 ENCOUNTER — Telehealth: Payer: Self-pay | Admitting: Family Medicine

## 2018-08-17 NOTE — Telephone Encounter (Signed)
Please let the patient know that I heard back from [facility]. She noted that she would not test for anything additional and if his symptoms persist and no cause is found they could evaluate at his appointment in June.

## 2018-08-17 NOTE — Telephone Encounter (Signed)
-----   Message from Nori Riis, PA-C sent at 08/16/2018  4:19 PM EDT ----- Dr. Caryl Bis, I wouldn't add anything else to his testing at this time.  If the tests return negative and his symptoms do not clear with the Keflex, we may consider a cystoscopy in the future.  It looks like he is coming in June to see [name].  We can definitely address this issue and his hydrocele at that time.   Stay safe, Zara Council, PA-C ----- Message ----- From: Leone Haven, MD Sent: 08/16/2018   3:54 PM EDT To: Nori Riis, PA-C  Roseanna Rainbow,   I saw Mr Sliwa today for penile discharge and discomfort. It looks like you saw him for this last year. He was able to provide a very small sample of the discharge which I am sending for GC/chlamydia testing and trichomonas testing though I have a low suspicion given his history. It looks like you mentioned it could be due to sloughing of his bladder lining when you saw him. I wanted to see if there was anything additional that you all would look for with this finding in him. He also appears to have a hydrocele that he reports has enlarged and he likely needs to follow-up with you all for that.   Tommi Rumps

## 2018-08-17 NOTE — Progress Notes (Signed)
I have reviewed the above note and agree. I was available to the pharmacist for consultation.  Tommi Rumps, MD

## 2018-08-18 ENCOUNTER — Other Ambulatory Visit: Payer: Self-pay

## 2018-08-18 ENCOUNTER — Ambulatory Visit (INDEPENDENT_AMBULATORY_CARE_PROVIDER_SITE_OTHER): Payer: Medicare Other

## 2018-08-18 ENCOUNTER — Telehealth (INDEPENDENT_AMBULATORY_CARE_PROVIDER_SITE_OTHER): Payer: Medicare Other | Admitting: Nurse Practitioner

## 2018-08-18 ENCOUNTER — Encounter: Payer: Self-pay | Admitting: Nurse Practitioner

## 2018-08-18 ENCOUNTER — Telehealth: Payer: Self-pay

## 2018-08-18 VITALS — BP 126/66 | HR 84 | Ht 70.0 in | Wt 192.7 lb

## 2018-08-18 DIAGNOSIS — I4821 Permanent atrial fibrillation: Secondary | ICD-10-CM

## 2018-08-18 DIAGNOSIS — Z952 Presence of prosthetic heart valve: Secondary | ICD-10-CM

## 2018-08-18 DIAGNOSIS — I255 Ischemic cardiomyopathy: Secondary | ICD-10-CM

## 2018-08-18 DIAGNOSIS — Z5181 Encounter for therapeutic drug level monitoring: Secondary | ICD-10-CM

## 2018-08-18 DIAGNOSIS — N2581 Secondary hyperparathyroidism of renal origin: Secondary | ICD-10-CM | POA: Diagnosis not present

## 2018-08-18 DIAGNOSIS — N186 End stage renal disease: Secondary | ICD-10-CM | POA: Diagnosis not present

## 2018-08-18 DIAGNOSIS — I5022 Chronic systolic (congestive) heart failure: Secondary | ICD-10-CM

## 2018-08-18 DIAGNOSIS — I25118 Atherosclerotic heart disease of native coronary artery with other forms of angina pectoris: Secondary | ICD-10-CM | POA: Diagnosis not present

## 2018-08-18 DIAGNOSIS — Z23 Encounter for immunization: Secondary | ICD-10-CM | POA: Diagnosis not present

## 2018-08-18 LAB — URINE CYTOLOGY ANCILLARY ONLY
Chlamydia: NEGATIVE
Neisseria Gonorrhea: NEGATIVE
Trichomonas: NEGATIVE

## 2018-08-18 LAB — POCT INR: INR: 1.4 — AB (ref 2.0–3.0)

## 2018-08-18 NOTE — Progress Notes (Addendum)
Virtual Visit via Video Note   This visit type was conducted due to national recommendations for restrictions regarding the COVID-19 Pandemic (e.g. social distancing) in an effort to limit this patient's exposure and mitigate transmission in our community.  Due to his co-morbid illnesses, this patient is at least at moderate risk for complications without adequate follow up.  This format is felt to be most appropriate for this patient at this time.  All issues noted in this document were discussed and addressed.  A limited physical exam was performed with this format.  Please refer to the patient's chart for his consent to telehealth for [facility]. Evaluation Performed:  Follow-up visit  This visit type was conducted due to national recommendations for restrictions regarding the COVID-19 Pandemic (e.g. social distancing).  This format is felt to be most appropriate for this patient at this time.  All issues noted in this document were discussed and addressed.  No physical exam was performed (except for noted visual exam findings with Video Visits).  Please refer to the patient's chart (MyChart message for video visits and phone note for telephone visits) for the patient's consent to telehealth for [facility]. _____________   Date:  08/18/2018   Patient ID:  Jeremy [name], DOB 09/11/1955, MRN [id number] Patient Location:  Home Provider location:   Office  Primary Care Provider:  Leone Haven, MD Primary Cardiologist:  Jeremy Sacramento, MD  Chief Complaint    63 y/o ? w/a h/o multivessel CAD pending CABG, Ao Stenosis s/p mech AVR (chronic coumadin), permanent Afib, IgA nephropathy s/p transplant & renal cell carcinoma s/p bilat nephrectomy  ESRD on HD, HTN, HL, OSA, and anemia of chronic dzs, who presents for f/u related to ongoing angina and afib.  Past Medical History    Past Medical History:  Diagnosis Date  . 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 Aortic stenosis    a. 2014 s/p mech AVR ([facility])-->chronic coumadin; b. 08/2018 Echo: Mild AS/AI. Nl fxn/gradient.  . Arthritis   . CAD (coronary artery disease)    a. 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 02/2018 MV [facility]): large inf, infsept, inflat infarct w/ min rev, EF 30%; c. 06/2018 Cath: LM min irregs, LAD 60p, 95/30/37m, LCX min irregs, RCA 40p, 43m, 90d, RPAV 90, RPDA 95ost-->pending CABG (delayed 2/2 Covid).  . Chronic anticoagulation 06/2012   a. Coumadin in the setting of mech AVR and afib.  . Dialysis patient [facility]) 01 01 2008  . ESRD (end stage renal disease) ([place])    a. CKD 2/2 to IgA nephropathy (dx age 29); b. 1999 s/p Kidney transplant; c. 2014 Bilat nephrectomy (transplanted kidney resected) in the setting of renal cell carcinoma; d. PD until 11/2017-->HD.  Marland Kitchen HFrEF (heart failure with reduced ejection fraction) ([place])    a. 07/2016 Echo: EF 55-60%; b. 02/2018 MV: EF 30%; c. 04/2018 Echo: EF 35%; d. 08/2018 Echo: EF 30-35%. Glob HK w/o rwma. Mildly reduced RV fxn. Mod to sev dil LA. Nl AoV fxn.  Marland Kitchen Hx of colonoscopy    2009 by Dr. Laural Golden. Normal except for small submucosal lipoma. No evidence of polyps or colitis.  . Hyperlipidemia   . Hypertension   . Hypogonadism in male 07/25/2015  . Ischemic cardiomyopathy    a. 07/2016 Echo: EF 55-60%; b. 02/2018 MV: EF 30%; c. 04/2018 Echo: EF 35%; d. 08/2018 Echo: EF 30-35%.  . OSA (  obstructive sleep apnea)    No CPAP  . Permanent atrial fibrillation    a. CHA2DS2VASc = 3-->Coumadin (also mech AVR); b. 06/2018 Amio started 2/2 elevated rates.  . Renal cell carcinoma ([place]) 08/04/2016   Past Surgical History:  Procedure Laterality Date  . AORTIC VALVE REPLACEMENT  3 /11 /2014   At [place]    . CAPD INSERTION N/A 02/19/2017   Procedure: LAPAROSCOPIC INSERTION CONTINUOUS AMBULATORY PERITONEAL DIALYSIS  (CAPD) CATHETER;  Surgeon: Algernon Huxley, MD;  Location:  [facility] ORS;  Service: General;  Laterality: N/A;  . CORONARY ANGIOPLASTY WITH STENT PLACEMENT    . DG AV DIALYSIS  SHUNT ACCESS EXIST*L* OR     left arm  . EPIGASTRIC HERNIA REPAIR N/A 02/19/2017   Procedure: HERNIA REPAIR EPIGASTRIC ADULT;  Surgeon: Robert Bellow, MD;  Location: [facility] ORS;  Service: General;  Laterality: N/A;  . EYE SURGERY     bilateral cataract  . HERNIA REPAIR     UMBILICAL HERNIA  . HERNIA REPAIR  02/19/2017  . INNER EAR SURGERY     20 years ago  . KIDNEY TRANSPLANT    . Peritoneal Dialysis access placement  02/19/2017  . REMOVAL OF A DIALYSIS CATHETER N/A 11/23/2017   Procedure: REMOVAL OF A PERITONEAL DIALYSIS CATHETER;  Surgeon: Algernon Huxley, MD;  Location: [facility] ORS;  Service: Vascular;  Laterality: N/A;  . RIGHT/LEFT HEART CATH AND CORONARY ANGIOGRAPHY Bilateral 06/07/2018   Procedure: RIGHT/LEFT HEART CATH AND CORONARY ANGIOGRAPHY;  Surgeon: Wellington Hampshire, MD;  Location: [place] [facility];  Service: Cardiovascular;  Laterality: Bilateral;  . TEE WITHOUT CARDIOVERSION N/A 07/21/2016   Procedure: TRANSESOPHAGEAL ECHOCARDIOGRAM (TEE);  Surgeon: Wellington Hampshire, MD;  Location: [facility] ORS;  Service: Cardiovascular;  Laterality: N/A;    Allergies  Allergies  Allergen Reactions  . Statins     Other reaction(s): Arthralgias (intolerance), Myalgias (intolerance)  . Sertraline Rash    History of Present Illness    Jeremy [name] is a 63 y.o. male who presents via audio/video conferencing for a telehealth visit today.  We initially attempted a video visit from 2 different platforms however, he was not able to connect with either his iPad or iPhone.  As result, we settled for a phone visit.  As above, he has a h/o multivessel CAD pending CABG, Ao Stenosis s/p mech AVR (chronic coumadin), permanent Afib, IgA nephropathy dx @ age 27 and s/p transplant in 1999.  He was dx w/ renal cell carcinoma in 2014 and required bilat nephrectomy resulting in ESRD.  He was initially  on PD but has been on HD since 11/2017.  Other hx includes HTN, HL, OSA, and anemia of chronic dzs.  From a cardiac standpoint, he was prev followed by [facility] w/ PCI/DES to the LAD in 2013 and [facility] mech AVR in 2014 for severe Aortic Stenosis.  He has since been on coumadin.  In the setting of worsening angina, he underwent myoview stress testing in 02/2018 @ [facility], which showed a large inf, infsept, and inflat defect w/ minimal reversibility.  EF was 30%, which was new.  F/u echo confirmed LV dysfxn, w/ EF of 35% and mod reduced RV fxn.  He underwent cath in 06/2018, which showed severe LAD and RCA/RPAV/RPDA dzs and was subsequently eval by CT surgery with recommendation for CABG.  Unfortunately, this was cancelled in the setting of the Covid-19 pandemic.  He was last seen via video  visit by Dr. [name] on 4/30, at which time he reported significant lifestyle limiting angina.  He also reported issues of hypotension and tachycardia during HD, which resulted in discontinuation of imdur and initiation of midodrine.  HR was reported as 131 at the time of his visit and out of concern that tachycardia was likely contributing to angina, he was placed on amio (coumadin reduced) w/ a plan for outpt dccv in a few wks.  Since his last visit he has noted an improvement in exercise tolerance and an overall reduction in frequency of angina.  Whereas he says he was using nitroglycerin "like candy" in the past, he is only taken about 2 nitroglycerin in the past week.  That he only took yards prior to experiencing chest tightness, he is now able to walk 50+ yards.  He has not been experiencing any palpitations, dyspnea, PND, orthopnea, dizziness, syncope, edema, or early satiety.  Heart rates at recent provider visits have been in the 80s.  His weight has been stable.  He continues to have low blood pressures following dialysis but pressures have been more stable on nondialysis days.  Recent follow-up echo showed stable LV dysfunction  with an EF of 30 to 35%.  Normal aortic valve function.  The patient does not have symptoms concerning for COVID-19 infection (fever, chills, cough, or new shortness of breath).   Home Medications    Prior to Admission medications   Medication Sig Start Date End Date Taking? Authorizing Provider  acetaminophen (TYLENOL) 500 MG tablet Take 1,000 mg 2 (two) times daily as needed by mouth for moderate pain.    [provider]  allopurinol (ZYLOPRIM) 300 MG tablet Take 300 mg by mouth daily. 12/23/17   [provider]  amiodarone (PACERONE) 200 MG tablet Take 1 tablet (200 mg total) by mouth 2 (two) times daily. 08/05/18   Wellington Hampshire, MD  cephALEXin (KEFLEX) 250 MG capsule Take 1 capsule (250 mg total) by mouth 2 (two) times daily. 08/14/18   Jeremy Haven, MD  cinacalcet (SENSIPAR) 30 MG tablet Take 30 mg by mouth daily.     [provider]  Dextrose-Fructose-Sod Citrate (NAUZENE PO) Take 1-2 tablets 2 (two) times daily as needed by mouth (nausea).    [provider]  FLUoxetine (PROZAC) 10 MG capsule TAKE 1 CAPSULE BY MOUTH EVERY DAY 07/14/18   Jeremy Haven, MD  lidocaine-prilocaine (EMLA) cream Apply 1 application as needed topically (port access).    [provider]  midodrine (PROAMATINE) 5 MG tablet Take 5 mg by mouth. Take on Monday, Wednesday and Friday. 07/30/18   [provider]  Multiple Vitamin (MULTIVITAMIN WITH MINERALS) TABS tablet Take 1 tablet by mouth daily.    [provider]  nitroGLYCERIN (NITROSTAT) 0.4 MG SL tablet PLACE 1 TABLET (0.4 MG TOTAL) UNDER THE TONGUE EVERY 5 (FIVE) MINUTES AS NEEDED FOR CHEST PAIN. 08/09/18   Jeremy Haven, MD  omeprazole (PRILOSEC) 20 MG capsule Take 20 mg by mouth daily.    [provider]  pravastatin (PRAVACHOL) 10 MG tablet Take 1 tablet (10 mg) by mouth once daily 06/11/18   Wellington Hampshire, MD  predniSONE (DELTASONE) 5 MG tablet Take 5 mg by mouth daily with  breakfast.  10/12/16   [provider]  Probiotic Product (PROBIOTIC ADVANCED PO) Take 1 tablet by mouth daily.    [provider]  sevelamer carbonate (RENVELA) 800 MG tablet Take 40,000 mg by mouth 3 (three) times  daily with meals. 5 tabs per meal    [provider]  warfarin (COUMADIN) 1 MG tablet Take 1 tablet (1 mg total) by mouth daily at 6 PM. 08/05/18   Wellington Hampshire, MD    Review of Systems    He continues to have exertional chest tightness but overall, frequency is much less and exercise tolerance has improved significantly following the addition of amiodarone.  He denies dyspnea, palpitations, PND, orthopnea, dizziness, syncope, edema, or early satiety.  All other systems reviewed and are otherwise negative except as noted above.  Physical Exam    Vital Signs:  BP 126/66 (BP Location: Left Arm, Patient Position: Sitting, Cuff Size: Normal)   Pulse 84   Ht 5\' 10"  (1.778 m)   Wt 192 lb 10.9 oz (87.4 kg)   SpO2 96%   BMI 27.65 kg/m    No acute distress.  Awake alert and oriented x3.  Respirations regular and unlabored.  Accessory Clinical Findings     Lab Results  Component Value Date   INR 1.4 (A) 08/18/2018   INR 1.6 (A) 08/16/2018   INR 2.8 08/11/2018    Assessment & Plan    1.  CAD: Status post catheterization earlier this year in the setting of worsening LV function and abnormal stress testing, revealing severe LAD and RCA/branch vessel disease.  He has been evaluated by CT surgery with plan for bypass once feasible.  At his last visit, he was having significant angina and amiodarone was added for rate/rhythm control in the setting of atrial fibrillation.  Heart rate is 84 today and was also in the 80s at her recent primary care visit.  Patient says that since his last visit, he has had significant improvement in exercise tolerance and much less chest tightness and need to use nitrates.  He has not had any dyspnea.  He remains on statin  therapy.  No aspirin in the setting of Coumadin.  Beta-blocker discontinued in the setting of hypotension previously.  He has indicated that he is going to reach out to the CT surgery office to discuss timing of following bypass.  He will need perioperative heparin bridging in the setting of chronic Coumadin and mechanical AVR.  2.  ICM/HFrEF: Patient has had improvement in exercise tolerance.  Weight has been stable at home.  Recent follow-up echo with EF of 30 to 35%.  Utilization of evidence-based medicine limited by hypotension.  He is no longer on a beta-blocker, which he says he stopped after his last visit.  Volume management per hemodialysis.  3.  Permanent Afib: Palpitations.  Heart rates have been in the 80s.  Feeling much better.  Discussion at last visit with regards to cardioversion however, his INR was subtherapeutic at 1.6 on May 11, and 1.4 today.  Given subtherapeutic INR, wish to avoid TEE, and improved symptoms, would defer cardioversion at this time.  Can readdress once INR has been therapeutic for 3 to 4 weeks depending upon symptoms.  Continue amiodarone.  4. Essential HTN: Stable on nondialysis days but continues to run soft postdialysis.  He is not taking Midrin and has stopped his metoprolol.  5.  HL: LDL was 61 in May 2019.  He remains on statin therapy.  6. H/o Mech Aortic Valve: On chronic Coumadin with subtherapeutic INR being managed by primary care.  7.  ESRD: MWF HD per nephrology.  Using midodrine on HD days.  8.  Disposition: Follow-up video visit in 1 month or sooner if  necessary.  COVID-19 Education: The signs and symptoms of COVID-19 were discussed with the patient and how to seek care for testing (follow up with PCP or arrange E-visit).  The importance of social distancing was discussed today.  Patient Risk:   After full review of this patient's history and clinical status, I feel that he is at least moderate risk for cardiac complications at this time, thus  necessitating a telehealth visit sooner than our first available in office visit.  Time:   Today, I have spent 25 minutes with the patient with telehealth technology discussing heart disease, heart failure, and atrial fibrillation along with plans for management.    Murray Hodgkins, NP 08/18/2018, 3:01 PM

## 2018-08-18 NOTE — Patient Instructions (Signed)
Medication Instructions:  Your physician recommends that you continue on your current medications as directed. Please refer to the Current Medication list given to you today.  If you need a refill on your cardiac medications before your next appointment, please call your pharmacy.   Lab work: None ordered If you have labs (blood work) drawn today and your tests are completely normal, you will receive your results only by: Marland Kitchen MyChart Message (if you have MyChart) OR . A paper copy in the mail If you have any lab test that is abnormal or we need to change your treatment, we will call you to review the results.  Testing/Procedures: None ordered  Follow-Up: At [facility], you and your health needs are our priority.  As part of our continuing mission to provide you with exceptional heart care, we have created designated Provider Care Teams.  These Care Teams include your primary Cardiologist (physician) and Advanced Practice Providers (APPs -  Physician Assistants and Nurse Practitioners) who all work together to provide you with the care you need, when you need it. You will need a follow up appointment in 1 month virtual visit with Dr. [name]. You may see Kathlyn Sacramento, MD or one of the following Advanced Practice Providers on your designated Care Team:   Murray Hodgkins, NP Christell Faith, PA-C . Marrianne Mood, PA-C

## 2018-08-18 NOTE — Patient Instructions (Signed)
Please take 2 mg today, 2 mg tomorrow, then resume dosage of 1 mg every day.  Recheck INR on Monday.

## 2018-08-18 NOTE — Telephone Encounter (Signed)
Patient was informed of provider's message.  He stated he understood.

## 2018-08-18 NOTE — Telephone Encounter (Signed)
Please see anti-coag note for today. 

## 2018-08-18 NOTE — Telephone Encounter (Signed)
-----   Message from Wellington Hampshire, MD sent at 08/17/2018  4:16 PM EDT ----- Yes that makes sense.  Leafy Ro, can you please get this patient in our anticoagulation clinic?  Thanks  ----- Message ----- From: De Hollingshead, [facility] Sent: 08/17/2018   3:01 PM EDT To: Wellington Hampshire, MD, Leone Haven, MD  Dr. [name],   I'm the clinical pharmacist at Dr. Ellen Henri office, and I have been helping manage Jeremy [name] INR due to his difficult clinical picture and because his previous cardiologist was with [facility].  Now that he has established care with you, however, Dr. Caryl Bis and I have discussed that it might be best for your team at [facility] to manage his INR. This will help reduce steps of communication with any medication changes, and particularly for any procedures moving forward.   We spoke with the patient yesterday, and he was amenable to this. What would the process be to shift his management to your [facility]?  Thank you!  Catie Darnelle Maffucci, PharmD, [place] PGY2 Ambulatory Care Pharmacy Resident, [facility] Phone: [phone]

## 2018-08-18 NOTE — Telephone Encounter (Signed)
If Home Health RN is calling please get Coumadin Nurse on the phone STAT  1.  Are you calling in regards to an appointment? yes  2.  Are you calling for a refill ? Unknown   3.  Are you having bleeding issues? No  4.  Do you need clearance to hold Coumadin? No    Please route to the Coumadin Clinic Pool  Patient has results from home inr .  Please call.

## 2018-08-18 NOTE — Telephone Encounter (Signed)
Left message for pt that he is due for INR check tomorrow, but I can check him in drive thru today between 9-12; asked him to call and sched appt if he is able to come in today so that I will have necessary paperwork and know what kind of car to look for outside.   Asked him to call and sched for next week if he will be unable to come in this am.

## 2018-08-20 DIAGNOSIS — Z23 Encounter for immunization: Secondary | ICD-10-CM | POA: Diagnosis not present

## 2018-08-20 DIAGNOSIS — N186 End stage renal disease: Secondary | ICD-10-CM | POA: Diagnosis not present

## 2018-08-20 DIAGNOSIS — N2581 Secondary hyperparathyroidism of renal origin: Secondary | ICD-10-CM | POA: Diagnosis not present

## 2018-08-23 ENCOUNTER — Telehealth: Payer: Self-pay | Admitting: Cardiovascular Disease

## 2018-08-23 ENCOUNTER — Ambulatory Visit (INDEPENDENT_AMBULATORY_CARE_PROVIDER_SITE_OTHER): Payer: Medicare Other | Admitting: Cardiovascular Disease

## 2018-08-23 DIAGNOSIS — I4821 Permanent atrial fibrillation: Secondary | ICD-10-CM | POA: Diagnosis not present

## 2018-08-23 DIAGNOSIS — Z952 Presence of prosthetic heart valve: Secondary | ICD-10-CM

## 2018-08-23 DIAGNOSIS — N2581 Secondary hyperparathyroidism of renal origin: Secondary | ICD-10-CM | POA: Diagnosis not present

## 2018-08-23 DIAGNOSIS — Z23 Encounter for immunization: Secondary | ICD-10-CM | POA: Diagnosis not present

## 2018-08-23 DIAGNOSIS — N186 End stage renal disease: Secondary | ICD-10-CM | POA: Diagnosis not present

## 2018-08-23 LAB — POCT INR
INR: 2.2 (ref 2.0–3.0)
INR: 2.2 — AB (ref <=1.1)

## 2018-08-23 NOTE — Telephone Encounter (Signed)
Returned call to patient, see separate anticoag encounter for dosing instructions.

## 2018-08-23 NOTE — Telephone Encounter (Signed)
Patient calling for [facility]  INR reading was 2.2 Please call to discuss

## 2018-08-25 DIAGNOSIS — Z23 Encounter for immunization: Secondary | ICD-10-CM | POA: Diagnosis not present

## 2018-08-25 DIAGNOSIS — N2581 Secondary hyperparathyroidism of renal origin: Secondary | ICD-10-CM | POA: Diagnosis not present

## 2018-08-25 DIAGNOSIS — N186 End stage renal disease: Secondary | ICD-10-CM | POA: Diagnosis not present

## 2018-08-26 ENCOUNTER — Other Ambulatory Visit: Payer: Self-pay

## 2018-08-26 DIAGNOSIS — R972 Elevated prostate specific antigen [PSA]: Secondary | ICD-10-CM

## 2018-08-27 DIAGNOSIS — Z23 Encounter for immunization: Secondary | ICD-10-CM | POA: Diagnosis not present

## 2018-08-27 DIAGNOSIS — N2581 Secondary hyperparathyroidism of renal origin: Secondary | ICD-10-CM | POA: Diagnosis not present

## 2018-08-27 DIAGNOSIS — N186 End stage renal disease: Secondary | ICD-10-CM | POA: Diagnosis not present

## 2018-08-30 DIAGNOSIS — N2581 Secondary hyperparathyroidism of renal origin: Secondary | ICD-10-CM | POA: Diagnosis not present

## 2018-08-30 DIAGNOSIS — Z23 Encounter for immunization: Secondary | ICD-10-CM | POA: Diagnosis not present

## 2018-08-30 DIAGNOSIS — N186 End stage renal disease: Secondary | ICD-10-CM | POA: Diagnosis not present

## 2018-08-30 LAB — POCT INR: INR: 2.2 (ref 2.0–3.0)

## 2018-08-31 ENCOUNTER — Other Ambulatory Visit: Payer: Self-pay

## 2018-08-31 ENCOUNTER — Other Ambulatory Visit: Payer: Medicare Other

## 2018-08-31 DIAGNOSIS — R972 Elevated prostate specific antigen [PSA]: Secondary | ICD-10-CM

## 2018-09-01 DIAGNOSIS — N186 End stage renal disease: Secondary | ICD-10-CM | POA: Diagnosis not present

## 2018-09-01 DIAGNOSIS — N2581 Secondary hyperparathyroidism of renal origin: Secondary | ICD-10-CM | POA: Diagnosis not present

## 2018-09-01 DIAGNOSIS — Z23 Encounter for immunization: Secondary | ICD-10-CM | POA: Diagnosis not present

## 2018-09-01 LAB — PSA: Prostate Specific Ag, Serum: 5.1 ng/mL — ABNORMAL HIGH (ref 0.0–4.0)

## 2018-09-02 ENCOUNTER — Telehealth: Payer: Self-pay | Admitting: Cardiovascular Disease

## 2018-09-02 ENCOUNTER — Ambulatory Visit (INDEPENDENT_AMBULATORY_CARE_PROVIDER_SITE_OTHER): Payer: Medicare Other | Admitting: Interventional Cardiology

## 2018-09-02 DIAGNOSIS — I4821 Permanent atrial fibrillation: Secondary | ICD-10-CM | POA: Diagnosis not present

## 2018-09-02 DIAGNOSIS — Z952 Presence of prosthetic heart valve: Secondary | ICD-10-CM

## 2018-09-02 DIAGNOSIS — Z5181 Encounter for therapeutic drug level monitoring: Secondary | ICD-10-CM | POA: Diagnosis not present

## 2018-09-02 NOTE — Telephone Encounter (Signed)
Patient is calling with INR result. 2.2

## 2018-09-02 NOTE — Telephone Encounter (Signed)
Spoke with pt and results are from Monday 08/30/2018 per pt; please refer to Anticoagulation Encounter.

## 2018-09-02 NOTE — Patient Instructions (Signed)
Description   Spoke with pt and instructed pt to take 2 mg today then start taking 1 mg every day except 2mg  on Sundays. Amiodarone started 08/05/18. Recheck INR in 1 week-on Wednesday and call to office. Home INR monitor ~0.2 off compared to lab check historically.

## 2018-09-03 DIAGNOSIS — N186 End stage renal disease: Secondary | ICD-10-CM | POA: Diagnosis not present

## 2018-09-03 DIAGNOSIS — N2581 Secondary hyperparathyroidism of renal origin: Secondary | ICD-10-CM | POA: Diagnosis not present

## 2018-09-03 DIAGNOSIS — Z23 Encounter for immunization: Secondary | ICD-10-CM | POA: Diagnosis not present

## 2018-09-05 DIAGNOSIS — T8610 Unspecified complication of kidney transplant: Secondary | ICD-10-CM | POA: Diagnosis not present

## 2018-09-05 DIAGNOSIS — N186 End stage renal disease: Secondary | ICD-10-CM | POA: Diagnosis not present

## 2018-09-05 DIAGNOSIS — Z992 Dependence on renal dialysis: Secondary | ICD-10-CM | POA: Diagnosis not present

## 2018-09-06 ENCOUNTER — Ambulatory Visit: Payer: Medicare Other | Admitting: Thoracic Surgery (Cardiothoracic Vascular Surgery)

## 2018-09-06 DIAGNOSIS — D631 Anemia in chronic kidney disease: Secondary | ICD-10-CM | POA: Diagnosis not present

## 2018-09-06 DIAGNOSIS — N186 End stage renal disease: Secondary | ICD-10-CM | POA: Diagnosis not present

## 2018-09-06 DIAGNOSIS — N2581 Secondary hyperparathyroidism of renal origin: Secondary | ICD-10-CM | POA: Diagnosis not present

## 2018-09-06 DIAGNOSIS — E8779 Other fluid overload: Secondary | ICD-10-CM | POA: Diagnosis not present

## 2018-09-06 DIAGNOSIS — Z23 Encounter for immunization: Secondary | ICD-10-CM | POA: Diagnosis not present

## 2018-09-07 ENCOUNTER — Ambulatory Visit: Payer: Medicare Other | Admitting: Urology

## 2018-09-07 ENCOUNTER — Other Ambulatory Visit: Payer: Self-pay

## 2018-09-07 LAB — POCT INR: INR: 2.6 (ref 2.0–3.0)

## 2018-09-08 ENCOUNTER — Ambulatory Visit: Payer: Medicare Other | Admitting: Thoracic Surgery (Cardiothoracic Vascular Surgery)

## 2018-09-08 DIAGNOSIS — M65321 Trigger finger, right index finger: Secondary | ICD-10-CM | POA: Diagnosis not present

## 2018-09-08 DIAGNOSIS — E790 Hyperuricemia without signs of inflammatory arthritis and tophaceous disease: Secondary | ICD-10-CM | POA: Diagnosis not present

## 2018-09-08 DIAGNOSIS — M109 Gout, unspecified: Secondary | ICD-10-CM | POA: Diagnosis not present

## 2018-09-08 DIAGNOSIS — M150 Primary generalized (osteo)arthritis: Secondary | ICD-10-CM | POA: Diagnosis not present

## 2018-09-08 DIAGNOSIS — M25431 Effusion, right wrist: Secondary | ICD-10-CM | POA: Diagnosis not present

## 2018-09-08 DIAGNOSIS — M8580 Other specified disorders of bone density and structure, unspecified site: Secondary | ICD-10-CM | POA: Diagnosis not present

## 2018-09-08 DIAGNOSIS — M1990 Unspecified osteoarthritis, unspecified site: Secondary | ICD-10-CM | POA: Diagnosis not present

## 2018-09-08 DIAGNOSIS — D631 Anemia in chronic kidney disease: Secondary | ICD-10-CM | POA: Diagnosis not present

## 2018-09-08 DIAGNOSIS — Z23 Encounter for immunization: Secondary | ICD-10-CM | POA: Diagnosis not present

## 2018-09-08 DIAGNOSIS — M7989 Other specified soft tissue disorders: Secondary | ICD-10-CM | POA: Diagnosis not present

## 2018-09-08 DIAGNOSIS — M2550 Pain in unspecified joint: Secondary | ICD-10-CM | POA: Diagnosis not present

## 2018-09-08 DIAGNOSIS — M810 Age-related osteoporosis without current pathological fracture: Secondary | ICD-10-CM | POA: Diagnosis not present

## 2018-09-08 DIAGNOSIS — E663 Overweight: Secondary | ICD-10-CM | POA: Diagnosis not present

## 2018-09-08 DIAGNOSIS — N186 End stage renal disease: Secondary | ICD-10-CM | POA: Diagnosis not present

## 2018-09-08 DIAGNOSIS — E8779 Other fluid overload: Secondary | ICD-10-CM | POA: Diagnosis not present

## 2018-09-08 DIAGNOSIS — N2581 Secondary hyperparathyroidism of renal origin: Secondary | ICD-10-CM | POA: Diagnosis not present

## 2018-09-09 ENCOUNTER — Other Ambulatory Visit: Payer: Self-pay

## 2018-09-09 ENCOUNTER — Telehealth: Payer: Self-pay | Admitting: Cardiovascular Disease

## 2018-09-09 ENCOUNTER — Ambulatory Visit (INDEPENDENT_AMBULATORY_CARE_PROVIDER_SITE_OTHER): Payer: Medicare Other | Admitting: Thoracic Surgery (Cardiothoracic Vascular Surgery)

## 2018-09-09 ENCOUNTER — Ambulatory Visit (INDEPENDENT_AMBULATORY_CARE_PROVIDER_SITE_OTHER): Payer: Medicare Other | Admitting: Pharmacist

## 2018-09-09 ENCOUNTER — Encounter: Payer: Self-pay | Admitting: Thoracic Surgery (Cardiothoracic Vascular Surgery)

## 2018-09-09 VITALS — BP 93/58 | HR 92 | Temp 97.7°F | Resp 16 | Ht 70.0 in | Wt 187.0 lb

## 2018-09-09 DIAGNOSIS — Z952 Presence of prosthetic heart valve: Secondary | ICD-10-CM | POA: Diagnosis not present

## 2018-09-09 DIAGNOSIS — I2510 Atherosclerotic heart disease of native coronary artery without angina pectoris: Secondary | ICD-10-CM | POA: Diagnosis not present

## 2018-09-09 DIAGNOSIS — Z5181 Encounter for therapeutic drug level monitoring: Secondary | ICD-10-CM | POA: Diagnosis not present

## 2018-09-09 DIAGNOSIS — I4821 Permanent atrial fibrillation: Secondary | ICD-10-CM

## 2018-09-09 DIAGNOSIS — I255 Ischemic cardiomyopathy: Secondary | ICD-10-CM | POA: Diagnosis not present

## 2018-09-09 NOTE — H&P (View-Only) (Signed)
PCP is Leone Haven, MD Referring Provider is Wellington Hampshire, MD  Chief Complaint  Patient presents with  . Coronary Artery Disease    further discuss CABG    HPI: Jeremy [name] returns to further discuss coronary bypass grafting  Jeremy [name] is a 63 year old man with a complicated medical history.  He had an aortic valve replacement for aortic stenosis at [facility] in 2014.  That was a mechanical valve.  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  He had a renal transplant in 2014.  He has been back on dialysis since 2017.  He also has a history of hypertension, hyperlipidemia, chronic atrial fibrillation, arthritis, anemia of chronic disease, and obstructive sleep apnea.  He been having chest pain dating back to the fall 2019.  His pain had become more frequent and more severe.  Stress test showed an ejection fraction of 35% which deteriorated with stress.  An echocardiogram at [facility] also confirmed an EF of 35%.  He had moderate pulmonary hypertension and some right ventricular dysfunction.  There was mild AI, MR, and TR.  He became upset with cardiology at [facility] and transferred his care to Dr. [name].  Dr. Cicero Duck did cardiac catheterization which showed severe two-vessel disease involving the LAD and right coronaries.  He was referred for coronary bypass grafting.  I saw him in the office on 06/10/2018.  He was having chest pain essentially every day at that point was using nitroglycerin sometimes multiple times a day.  He was advised to undergo coronary bypass grafting, but refused at that time due to the [place] situation.  He wanted to wait.  He understood the risk.  In the interim since his last visit he was started on amiodarone.  He says that his pain is become much less frequent since then.  He is only taken 1 nitroglycerin tablet in the past week. Past Medical History:  Diagnosis Date  . Anemia in chronic kidney  disease 07/04/2015  . Aortic stenosis    a. 2014 s/p mech AVR ([facility])-->chronic coumadin; b. 08/2018 Echo: Mild AS/AI. Nl fxn/gradient.  . Arthritis   . CAD (coronary artery disease)    a. 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 02/2018 MV [facility] large inf, infsept, inflat infarct w/ min rev, EF 30%; c. 06/2018 Cath: LM min irregs, LAD 60p, 95/30/52m, LCX min irregs, RCA 40p, 62m, 90d, RPAV 90, RPDA 95ost-->pending CABG (delayed 2/2 Covid).  . Chronic anticoagulation 06/2012   a. Coumadin in the setting of mech AVR and afib.  . Dialysis patient [facility]) 01 01 2008  . ESRD (end stage renal disease) ([place])    a. 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 1999 s/p Kidney transplant; c. 2014 Bilat nephrectomy (transplanted kidney resected) in the setting of renal cell carcinoma; d. PD until 11/2017-->HD.  Marland Kitchen HFrEF (heart failure with reduced ejection fraction) ([place])    a. 07/2016 Echo: EF 55-60%; b. 02/2018 MV: EF 30%; c. 04/2018 Echo: EF 35%; d. 08/2018 Echo: EF 30-35%. Glob HK w/o rwma. Mildly reduced RV fxn. Mod to sev dil LA. Nl AoV fxn.  Marland Kitchen Hx of colonoscopy    2009 by Dr. Laural Golden. Normal except for small submucosal lipoma. No evidence of polyps or colitis.  . Hyperlipidemia   . Hypertension   . Hypogonadism in male 07/25/2015  . Ischemic cardiomyopathy  a. 07/2016 Echo: EF 55-60%; b. 02/2018 MV: EF 30%; c. 04/2018 Echo: EF 35%; d. 08/2018 Echo: EF 30-35%.  . OSA (obstructive sleep apnea)    No CPAP  . Permanent atrial fibrillation    a. CHA2DS2VASc = 3-->Coumadin (also mech AVR); b. 06/2018 Amio started 2/2 elevated rates.  . Renal cell carcinoma ([place]) 08/04/2016    Past Surgical History:  Procedure Laterality Date  . AORTIC VALVE REPLACEMENT  3 /11 /2014   At [facility]    . CAPD INSERTION N/A 02/19/2017   Procedure: LAPAROSCOPIC INSERTION CONTINUOUS AMBULATORY PERITONEAL DIALYSIS  (CAPD)  CATHETER;  Surgeon: Algernon Huxley, MD;  Location: [facility] ORS;  Service: General;  Laterality: N/A;  . CORONARY ANGIOPLASTY WITH STENT PLACEMENT    . DG AV DIALYSIS  SHUNT ACCESS EXIST*L* OR     left arm  . EPIGASTRIC HERNIA REPAIR N/A 02/19/2017   Procedure: HERNIA REPAIR EPIGASTRIC ADULT;  Surgeon: Robert Bellow, MD;  Location: [facility] ORS;  Service: General;  Laterality: N/A;  . EYE SURGERY     bilateral cataract  . HERNIA REPAIR     UMBILICAL HERNIA  . HERNIA REPAIR  02/19/2017  . INNER EAR SURGERY     20 years ago  . KIDNEY TRANSPLANT    . Peritoneal Dialysis access placement  02/19/2017  . REMOVAL OF A DIALYSIS CATHETER N/A 11/23/2017   Procedure: REMOVAL OF A PERITONEAL DIALYSIS CATHETER;  Surgeon: Algernon Huxley, MD;  Location: [facility] ORS;  Service: Vascular;  Laterality: N/A;  . RIGHT/LEFT HEART CATH AND CORONARY ANGIOGRAPHY Bilateral 06/07/2018   Procedure: RIGHT/LEFT HEART CATH AND CORONARY ANGIOGRAPHY;  Surgeon: Wellington Hampshire, MD;  Location: [facility];  Service: Cardiovascular;  Laterality: Bilateral;  . TEE WITHOUT CARDIOVERSION N/A 07/21/2016   Procedure: TRANSESOPHAGEAL ECHOCARDIOGRAM (TEE);  Surgeon: Wellington Hampshire, MD;  Location: [facility] ORS;  Service: Cardiovascular;  Laterality: N/A;    Family History  Problem Relation Age of Onset  . Cancer Mother        pancreatic  . Heart disease Father   . Diabetes Father     Social History Social History   Tobacco Use  . Smoking status: Never Smoker  . Smokeless tobacco: Never Used  Substance Use Topics  . Alcohol use: No  . Drug use: No    Current Outpatient Medications  Medication Sig Dispense Refill  . acetaminophen (TYLENOL) 500 MG tablet Take 1,000 mg 2 (two) times daily as needed by mouth for moderate pain.    Marland Kitchen allopurinol (ZYLOPRIM) 300 MG tablet Take 300 mg by mouth daily.  5  . amiodarone (PACERONE) 200 MG tablet Take 1 tablet (200 mg total) by mouth 2 (two) times daily. 180 tablet 3  . cephALEXin  (KEFLEX) 250 MG capsule Take 1 capsule (250 mg total) by mouth 2 (two) times daily. 14 capsule 0  . cinacalcet (SENSIPAR) 30 MG tablet Take 30 mg by mouth daily.     Marland Kitchen Dextrose-Fructose-Sod Citrate (NAUZENE PO) Take 1-2 tablets 2 (two) times daily as needed by mouth (nausea).    Marland Kitchen FLUoxetine (PROZAC) 10 MG capsule TAKE 1 CAPSULE BY MOUTH EVERY DAY 90 capsule 0  . midodrine (PROAMATINE) 5 MG tablet Take 5 mg by mouth. Take on Monday, Wednesday and Friday.    . Multiple Vitamin (MULTIVITAMIN WITH MINERALS) TABS tablet Take 1 tablet by mouth daily.    . nitroGLYCERIN (NITROSTAT) 0.4 MG SL tablet PLACE 1  TABLET (0.4 MG TOTAL) UNDER THE TONGUE EVERY 5 (FIVE) MINUTES AS NEEDED FOR CHEST PAIN. 25 tablet 2  . omeprazole (PRILOSEC) 20 MG capsule Take 20 mg by mouth daily.    . pravastatin (PRAVACHOL) 10 MG tablet Take 1 tablet (10 mg) by mouth once daily 30 tablet 6  . predniSONE (DELTASONE) 5 MG tablet Take 5 mg by mouth daily with breakfast.     . Probiotic Product (PROBIOTIC ADVANCED PO) Take 1 tablet by mouth daily.    . sevelamer carbonate (RENVELA) 800 MG tablet Take 40,000 mg by mouth 3 (three) times daily with meals. 5 tabs per meal    . warfarin (COUMADIN) 1 MG tablet Take 1 tablet (1 mg total) by mouth daily at 6 PM. 90 tablet 1   No current facility-administered medications for this visit.     Allergies  Allergen Reactions  . Statins     Other reaction(s): Arthralgias (intolerance), Myalgias (intolerance)  . Sertraline Rash    Review of Systems  Constitutional: Positive for fatigue.  HENT: Positive for hearing loss.   Respiratory: Positive for apnea (CPAP).   Cardiovascular: Positive for chest pain, palpitations and leg swelling (mild).  Genitourinary:       Renal failure/ HD  Musculoskeletal: Positive for arthralgias and joint swelling.  Neurological: Negative for syncope and weakness.  Hematological: Bruises/bleeds easily.  Psychiatric/Behavioral: The patient is nervous/anxious.    All other systems reviewed and are negative.   BP (!) 93/58 (BP Location: Right Arm, Patient Position: Sitting, Cuff Size: Normal)   Pulse 92   Temp 97.7 F (36.5 C) (Skin)   Resp 16   Ht 5\' 10"  (1.778 m)   Wt 187 lb (84.8 kg)   SpO2 98% Comment: ON RA  BMI 26.83 kg/m  Physical Exam Constitutional:      General: He is not in acute distress.    Appearance: Normal appearance.  HENT:     Head: Normocephalic and atraumatic.     Mouth/Throat:     Comments: Wearing surgical mask Eyes:     General: No scleral icterus.    Extraocular Movements: Extraocular movements intact.  Neck:     Musculoskeletal: Neck supple.  Cardiovascular:     Rate and Rhythm: Normal rate and regular rhythm.     Heart sounds: Murmur (2/6 systolic) present.     Comments: Good valve click Pulmonary:     Effort: Pulmonary effort is normal. No respiratory distress.     Breath sounds: Normal breath sounds. No wheezing or rales.  Abdominal:     General: There is no distension.     Palpations: Abdomen is soft.     Tenderness: There is no abdominal tenderness.  Musculoskeletal:        General: No swelling.     Comments: Fistula left arm, good thrill  Lymphadenopathy:     Cervical: No cervical adenopathy.  Skin:    General: Skin is warm and dry.  Neurological:     General: No focal deficit present.     Mental Status: He is alert and oriented to person, place, and time.     Cranial Nerves: No cranial nerve deficit.     Motor: No weakness.     Coordination: Coordination normal.     Gait: Gait normal.    Diagnostic Tests: Cardiac catheterization Conclusion     Prox LAD lesion is 60% stenosed.  Mid LAD-2 lesion is 30% stenosed.  Mid LAD-1 lesion is 95% stenosed.  Mid LAD to  Dist LAD lesion is 60% stenosed.  Prox RCA lesion is 40% stenosed.  Mid RCA lesion is 70% stenosed.  Dist RCA lesion is 90% stenosed with 90% stenosed side branch in Post Atrio.  Ost RPDA to RPDA lesion is 95%  stenosed.   1.  Severe heavily calcified two-vessel coronary artery disease affecting the LAD and right coronary artery.  The disease is not optimal for atherectomy given significant tortuosity in the proximal LAD segment and diffuse disease affecting the right coronary artery extending into the bifurcation. 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 2.  Right heart catheterization showed mildly elevated filling pressures, moderate pulmonary hypertension and normal cardiac output.  Recommendations: Recommend evaluation for CABG given diffuse heavily calcified disease. Resume warfarin today if no bleeding issues from the groin. Continue aggressive treatment of risk factors.   I personally reviewed the catheterization images and concur with the findings noted above  Impression: Jeremy [name] is a 63 year old gentleman with a history of IgA nephropathy, renal cell carcinoma, end-stage renal disease, hemodialysis, renal transplantation (failed), arthritis, hypertension, hyperlipidemia, obstructive sleep apnea, and chronic atrial fibrillation.  He was found to have severe two-vessel coronary disease back in March but refused surgery at that time.  He continues to have some anginal pain although it is less frequent and less severe than it was previously.  He does have severe two-vessel disease with no visible circumflex vessel on the catheterization.  He also has impaired left ventricular dysfunction.  Coronary artery bypass grafting is still indicated for survival benefit.  He does understand that it is his decision whether he would like to proceed.  I again described the general nature of the procedure to him.  He understands we would be doing a redo sternotomy for coronary bypass grafting.  We would also harvest vein from his leg.  He understands the indications, risks, benefits, and alternatives.  He understands the general nature of  the procedure which will be very similar to his previous operation.  He understands the risks include, but are not limited to death, MI, DVT, PE, bleeding, possible need for transfusion, infection, cardiac arrhythmias, heart block, respiratory failure, and gastrointestinal complications.  He has a mechanical AVR and will be on lifelong anticoagulation.  His atrial fibrillation currently is well controlled with his current regimen.  I see no indication for a Maze procedure in his case.  His dose of Coumadin is 1 mg daily except Sundays when he takes 2 mg.  He will need to be off of Coumadin for 5 days prior to surgery.  With a mechanical heart valve he will need to be bridged with systemic anticoagulation.  We will contact Dr. Tyrell Antonio office to discussed the timing of that.  Plan: Redo sternotomy for coronary artery bypass grafting on Thursday, 09/16/2018 We will arrange bridge anticoagulation with cardiology We will inform [facility] on admission  Melrose Nakayama, MD [facility] [phone]

## 2018-09-09 NOTE — Telephone Encounter (Signed)
See anticoag encounter from 6/4

## 2018-09-09 NOTE — Telephone Encounter (Signed)
Patient is calling with INR reading :2.6

## 2018-09-09 NOTE — Progress Notes (Signed)
PCP is Leone Haven, MD Referring Provider is Wellington Hampshire, MD  Chief Complaint  Patient presents with  . Coronary Artery Disease    further discuss CABG    HPI: Jeremy [name] returns to further discuss coronary bypass grafting  Jeremy [name] is a 63 year old man with a complicated medical history.  He had an aortic valve replacement for aortic stenosis at [facility] in 2014.  That was a mechanical valve.  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  He had a renal transplant in 2014.  He has been back on dialysis since 2017.  He also has a history of hypertension, hyperlipidemia, chronic atrial fibrillation, arthritis, anemia of chronic disease, and obstructive sleep apnea.  He been having chest pain dating back to the fall 2019.  His pain had become more frequent and more severe.  Stress test showed an ejection fraction of 35% which deteriorated with stress.  An echocardiogram at [facility] also confirmed an EF of 35%.  He had moderate pulmonary hypertension and some right ventricular dysfunction.  There was mild AI, MR, and TR.  He became upset with cardiology at [facility] and transferred his care to Dr. [name].  Dr. Cicero Duck did cardiac catheterization which showed severe two-vessel disease involving the LAD and right coronaries.  He was referred for coronary bypass grafting.  I saw him in the office on 06/10/2018.  He was having chest pain essentially every day at that point was using nitroglycerin sometimes multiple times a day.  He was advised to undergo coronary bypass grafting, but refused at that time due to the [place] situation.  He wanted to wait.  He understood the risk.  In the interim since his last visit he was started on amiodarone.  He says that his pain is become much less frequent since then.  He is only taken 1 nitroglycerin tablet in the past week. Past Medical History:  Diagnosis Date  . Anemia in chronic kidney  disease 07/04/2015  . Aortic stenosis    a. 2014 s/p mech AVR ([facility])-->chronic coumadin; b. 08/2018 Echo: Mild AS/AI. Nl fxn/gradient.  . Arthritis   . CAD (coronary artery disease)    a. 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 02/2018 MV [facility]): large inf, infsept, inflat infarct w/ min rev, EF 30%; c. 06/2018 Cath: LM min irregs, LAD 60p, 95/30/105m, LCX min irregs, RCA 40p, 34m, 90d, RPAV 90, RPDA 95ost-->pending CABG (delayed 2/2 Covid).  . Chronic anticoagulation 06/2012   a. Coumadin in the setting of mech AVR and afib.  . Dialysis patient [facility]) 01 01 2008  . ESRD (end stage renal disease) ([place])    a. 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 1999 s/p Kidney transplant; c. 2014 Bilat nephrectomy (transplanted kidney resected) in the setting of renal cell carcinoma; d. PD until 11/2017-->HD.  Marland Kitchen HFrEF (heart failure with reduced ejection fraction) ([place])    a. 07/2016 Echo: EF 55-60%; b. 02/2018 MV: EF 30%; c. 04/2018 Echo: EF 35%; d. 08/2018 Echo: EF 30-35%. Glob HK w/o rwma. Mildly reduced RV fxn. Mod to sev dil LA. Nl AoV fxn.  Marland Kitchen Hx of colonoscopy    2009 by Dr. Laural Golden. Normal except for small submucosal lipoma. No evidence of polyps or colitis.  . Hyperlipidemia   . Hypertension   . Hypogonadism in male 07/25/2015  . Ischemic cardiomyopathy  a. 07/2016 Echo: EF 55-60%; b. 02/2018 MV: EF 30%; c. 04/2018 Echo: EF 35%; d. 08/2018 Echo: EF 30-35%.  . OSA (obstructive sleep apnea)    No CPAP  . Permanent atrial fibrillation    a. CHA2DS2VASc = 3-->Coumadin (also mech AVR); b. 06/2018 Amio started 2/2 elevated rates.  . Renal cell carcinoma ([place]) 08/04/2016    Past Surgical History:  Procedure Laterality Date  . AORTIC VALVE REPLACEMENT  3 /11 /2014   At [place]    . CAPD INSERTION N/A 02/19/2017   Procedure: LAPAROSCOPIC INSERTION CONTINUOUS AMBULATORY PERITONEAL DIALYSIS  (CAPD)  CATHETER;  Surgeon: Algernon Huxley, MD;  Location: [facility] ORS;  Service: General;  Laterality: N/A;  . CORONARY ANGIOPLASTY WITH STENT PLACEMENT    . DG AV DIALYSIS  SHUNT ACCESS EXIST*L* OR     left arm  . EPIGASTRIC HERNIA REPAIR N/A 02/19/2017   Procedure: HERNIA REPAIR EPIGASTRIC ADULT;  Surgeon: Robert Bellow, MD;  Location: [facility] ORS;  Service: General;  Laterality: N/A;  . EYE SURGERY     bilateral cataract  . HERNIA REPAIR     UMBILICAL HERNIA  . HERNIA REPAIR  02/19/2017  . INNER EAR SURGERY     20 years ago  . KIDNEY TRANSPLANT    . Peritoneal Dialysis access placement  02/19/2017  . REMOVAL OF A DIALYSIS CATHETER N/A 11/23/2017   Procedure: REMOVAL OF A PERITONEAL DIALYSIS CATHETER;  Surgeon: Algernon Huxley, MD;  Location: [facility] ORS;  Service: Vascular;  Laterality: N/A;  . RIGHT/LEFT HEART CATH AND CORONARY ANGIOGRAPHY Bilateral 06/07/2018   Procedure: RIGHT/LEFT HEART CATH AND CORONARY ANGIOGRAPHY;  Surgeon: Wellington Hampshire, MD;  Location: [place] [facility];  Service: Cardiovascular;  Laterality: Bilateral;  . TEE WITHOUT CARDIOVERSION N/A 07/21/2016   Procedure: TRANSESOPHAGEAL ECHOCARDIOGRAM (TEE);  Surgeon: Wellington Hampshire, MD;  Location: [facility] ORS;  Service: Cardiovascular;  Laterality: N/A;    Family History  Problem Relation Age of Onset  . Cancer Mother        pancreatic  . Heart disease Father   . Diabetes Father     Social History Social History   Tobacco Use  . Smoking status: Never Smoker  . Smokeless tobacco: Never Used  Substance Use Topics  . Alcohol use: No  . Drug use: No    Current Outpatient Medications  Medication Sig Dispense Refill  . acetaminophen (TYLENOL) 500 MG tablet Take 1,000 mg 2 (two) times daily as needed by mouth for moderate pain.    Marland Kitchen allopurinol (ZYLOPRIM) 300 MG tablet Take 300 mg by mouth daily.  5  . amiodarone (PACERONE) 200 MG tablet Take 1 tablet (200 mg total) by mouth 2 (two) times daily. 180 tablet 3  . cephALEXin  (KEFLEX) 250 MG capsule Take 1 capsule (250 mg total) by mouth 2 (two) times daily. 14 capsule 0  . cinacalcet (SENSIPAR) 30 MG tablet Take 30 mg by mouth daily.     Marland Kitchen Dextrose-Fructose-Sod Citrate (NAUZENE PO) Take 1-2 tablets 2 (two) times daily as needed by mouth (nausea).    Marland Kitchen FLUoxetine (PROZAC) 10 MG capsule TAKE 1 CAPSULE BY MOUTH EVERY DAY 90 capsule 0  . midodrine (PROAMATINE) 5 MG tablet Take 5 mg by mouth. Take on Monday, Wednesday and Friday.    . Multiple Vitamin (MULTIVITAMIN WITH MINERALS) TABS tablet Take 1 tablet by mouth daily.    . nitroGLYCERIN (NITROSTAT) 0.4 MG SL tablet PLACE 1  TABLET (0.4 MG TOTAL) UNDER THE TONGUE EVERY 5 (FIVE) MINUTES AS NEEDED FOR CHEST PAIN. 25 tablet 2  . omeprazole (PRILOSEC) 20 MG capsule Take 20 mg by mouth daily.    . pravastatin (PRAVACHOL) 10 MG tablet Take 1 tablet (10 mg) by mouth once daily 30 tablet 6  . predniSONE (DELTASONE) 5 MG tablet Take 5 mg by mouth daily with breakfast.     . Probiotic Product (PROBIOTIC ADVANCED PO) Take 1 tablet by mouth daily.    . sevelamer carbonate (RENVELA) 800 MG tablet Take 40,000 mg by mouth 3 (three) times daily with meals. 5 tabs per meal    . warfarin (COUMADIN) 1 MG tablet Take 1 tablet (1 mg total) by mouth daily at 6 PM. 90 tablet 1   No current facility-administered medications for this visit.     Allergies  Allergen Reactions  . Statins     Other reaction(s): Arthralgias (intolerance), Myalgias (intolerance)  . Sertraline Rash    Review of Systems  Constitutional: Positive for fatigue.  HENT: Positive for hearing loss.   Respiratory: Positive for apnea (CPAP).   Cardiovascular: Positive for chest pain, palpitations and leg swelling (mild).  Genitourinary:       Renal failure/ HD  Musculoskeletal: Positive for arthralgias and joint swelling.  Neurological: Negative for syncope and weakness.  Hematological: Bruises/bleeds easily.  Psychiatric/Behavioral: The patient is nervous/anxious.    All other systems reviewed and are negative.   BP (!) 93/58 (BP Location: Right Arm, Patient Position: Sitting, Cuff Size: Normal)   Pulse 92   Temp 97.7 F (36.5 C) (Skin)   Resp 16   Ht 5\' 10"  (1.778 m)   Wt 187 lb (84.8 kg)   SpO2 98% Comment: ON RA  BMI 26.83 kg/m  Physical Exam Constitutional:      General: He is not in acute distress.    Appearance: Normal appearance.  HENT:     Head: Normocephalic and atraumatic.     Mouth/Throat:     Comments: Wearing surgical mask Eyes:     General: No scleral icterus.    Extraocular Movements: Extraocular movements intact.  Neck:     Musculoskeletal: Neck supple.  Cardiovascular:     Rate and Rhythm: Normal rate and regular rhythm.     Heart sounds: Murmur (2/6 systolic) present.     Comments: Good valve click Pulmonary:     Effort: Pulmonary effort is normal. No respiratory distress.     Breath sounds: Normal breath sounds. No wheezing or rales.  Abdominal:     General: There is no distension.     Palpations: Abdomen is soft.     Tenderness: There is no abdominal tenderness.  Musculoskeletal:        General: No swelling.     Comments: Fistula left arm, good thrill  Lymphadenopathy:     Cervical: No cervical adenopathy.  Skin:    General: Skin is warm and dry.  Neurological:     General: No focal deficit present.     Mental Status: He is alert and oriented to person, place, and time.     Cranial Nerves: No cranial nerve deficit.     Motor: No weakness.     Coordination: Coordination normal.     Gait: Gait normal.    Diagnostic Tests: Cardiac catheterization Conclusion     Prox LAD lesion is 60% stenosed.  Mid LAD-2 lesion is 30% stenosed.  Mid LAD-1 lesion is 95% stenosed.  Mid LAD to  Dist LAD lesion is 60% stenosed.  Prox RCA lesion is 40% stenosed.  Mid RCA lesion is 70% stenosed.  Dist RCA lesion is 90% stenosed with 90% stenosed side branch in Post Atrio.  Ost RPDA to RPDA lesion is 95%  stenosed.   1.  Severe heavily calcified two-vessel coronary artery disease affecting the LAD and right coronary artery.  The disease is not optimal for atherectomy given significant tortuosity in the proximal LAD segment and diffuse disease affecting the right coronary artery extending into the bifurcation. 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 2.  Right heart catheterization showed mildly elevated filling pressures, moderate pulmonary hypertension and normal cardiac output.  Recommendations: Recommend evaluation for CABG given diffuse heavily calcified disease. Resume warfarin today if no bleeding issues from the groin. Continue aggressive treatment of risk factors.   I personally reviewed the catheterization images and concur with the findings noted above  Impression: Jeremy [name] is a 63 year old gentleman with a history of IgA nephropathy, renal cell carcinoma, end-stage renal disease, hemodialysis, renal transplantation (failed), arthritis, hypertension, hyperlipidemia, obstructive sleep apnea, and chronic atrial fibrillation.  He was found to have severe two-vessel coronary disease back in March but refused surgery at that time.  He continues to have some anginal pain although it is less frequent and less severe than it was previously.  He does have severe two-vessel disease with no visible circumflex vessel on the catheterization.  He also has impaired left ventricular dysfunction.  Coronary artery bypass grafting is still indicated for survival benefit.  He does understand that it is his decision whether he would like to proceed.  I again described the general nature of the procedure to him.  He understands we would be doing a redo sternotomy for coronary bypass grafting.  We would also harvest vein from his leg.  He understands the indications, risks, benefits, and alternatives.  He understands the general nature of  the procedure which will be very similar to his previous operation.  He understands the risks include, but are not limited to death, MI, DVT, PE, bleeding, possible need for transfusion, infection, cardiac arrhythmias, heart block, respiratory failure, and gastrointestinal complications.  He has a mechanical AVR and will be on lifelong anticoagulation.  His atrial fibrillation currently is well controlled with his current regimen.  I see no indication for a Maze procedure in his case.  His dose of Coumadin is 1 mg daily except Sundays when he takes 2 mg.  He will need to be off of Coumadin for 5 days prior to surgery.  With a mechanical heart valve he will need to be bridged with systemic anticoagulation.  We will contact Dr. Tyrell Antonio office to discussed the timing of that.  Plan: Redo sternotomy for coronary artery bypass grafting on Thursday, 09/16/2018 We will arrange bridge anticoagulation with cardiology We will inform [facility] on admission  Melrose Nakayama, MD [facility] [phone]

## 2018-09-10 ENCOUNTER — Other Ambulatory Visit: Payer: Self-pay | Admitting: Cardiovascular Disease

## 2018-09-10 ENCOUNTER — Telehealth: Payer: Self-pay

## 2018-09-10 DIAGNOSIS — D631 Anemia in chronic kidney disease: Secondary | ICD-10-CM | POA: Diagnosis not present

## 2018-09-10 DIAGNOSIS — N2581 Secondary hyperparathyroidism of renal origin: Secondary | ICD-10-CM | POA: Diagnosis not present

## 2018-09-10 DIAGNOSIS — E8779 Other fluid overload: Secondary | ICD-10-CM | POA: Diagnosis not present

## 2018-09-10 DIAGNOSIS — Z23 Encounter for immunization: Secondary | ICD-10-CM | POA: Diagnosis not present

## 2018-09-10 DIAGNOSIS — Z01812 Encounter for preprocedural laboratory examination: Secondary | ICD-10-CM

## 2018-09-10 DIAGNOSIS — I2510 Atherosclerotic heart disease of native coronary artery without angina pectoris: Secondary | ICD-10-CM

## 2018-09-10 DIAGNOSIS — N186 End stage renal disease: Secondary | ICD-10-CM | POA: Diagnosis not present

## 2018-09-10 NOTE — Telephone Encounter (Signed)
Contacted the pt to confirm that he is aware of Dr. Roxan Hockey recommendation regarding his Coumadin prior to his scheduled CABG.  His dose of Coumadin is 1 mg daily except Sundays when he takes 2 mg.  He will need to be off of Coumadin for 5 days prior to surgery.  With a mechanical heart valve he will need to be bridged with systemic anticoagulation.  We will contact Dr. Tyrell Antonio office to discussed the timing of that.  Pt will take his last dose of coumadin today, with a plan for heparin drip to be initiated during his 09/13/18 admission.  Pt sts that he was previously made aware of the plan, verbalized understanding, and voiced appreciation for the call.

## 2018-09-10 NOTE — Telephone Encounter (Signed)
Spoke with Brigitte Pulse @ [facility] patient placement. Pt will have a telebed available on the afternoon of 09/13/18. (pt has dialysis in the morning) Pt will be on 4 east. They will contact the pt directly with a bed number.  Cardmaster Trish made aware.  Spoke with Caryl Pina @ [facility] test site. Per Ashley's report from her supervisor, the pt should have a rapid COVID test done on the day of his 09/13/18 admission.  Contacted the pt to make him aware. [email or web address].

## 2018-09-10 NOTE — Telephone Encounter (Signed)
Spoke with the pt. Adv him that he will be admitted to [facility] 4E on the afternoon of 09/13/18. Adv the pt that [facility] pt placement will call him directly to provide his room number. Adv the that he is to report to the main entrance of [facility] on that day. Pt verbalized understanding and voiced appreciation for the assistance.

## 2018-09-13 ENCOUNTER — Inpatient Hospital Stay (HOSPITAL_COMMUNITY)
Admission: RE | Admit: 2018-09-13 | Discharge: 2018-09-22 | DRG: 235 | Disposition: A | Discharge status: Home / Self Care | Payer: Medicare Other | Attending: Thoracic Surgery (Cardiothoracic Vascular Surgery) | Admitting: Thoracic Surgery (Cardiothoracic Vascular Surgery)

## 2018-09-13 ENCOUNTER — Telehealth: Payer: Self-pay | Admitting: Cardiovascular Disease

## 2018-09-13 ENCOUNTER — Other Ambulatory Visit: Payer: Self-pay

## 2018-09-13 ENCOUNTER — Ambulatory Visit: Payer: Medicare Other | Admitting: Urology

## 2018-09-13 DIAGNOSIS — M1990 Unspecified osteoarthritis, unspecified site: Secondary | ICD-10-CM | POA: Diagnosis present

## 2018-09-13 DIAGNOSIS — N2581 Secondary hyperparathyroidism of renal origin: Secondary | ICD-10-CM | POA: Diagnosis not present

## 2018-09-13 DIAGNOSIS — Z79899 Other long term (current) drug therapy: Secondary | ICD-10-CM

## 2018-09-13 DIAGNOSIS — I2510 Atherosclerotic heart disease of native coronary artery without angina pectoris: Secondary | ICD-10-CM | POA: Diagnosis not present

## 2018-09-13 DIAGNOSIS — N186 End stage renal disease: Secondary | ICD-10-CM | POA: Diagnosis not present

## 2018-09-13 DIAGNOSIS — Z85528 Personal history of other malignant neoplasm of kidney: Secondary | ICD-10-CM

## 2018-09-13 DIAGNOSIS — Z951 Presence of aortocoronary bypass graft: Secondary | ICD-10-CM

## 2018-09-13 DIAGNOSIS — I959 Hypotension, unspecified: Secondary | ICD-10-CM | POA: Diagnosis not present

## 2018-09-13 DIAGNOSIS — R079 Chest pain, unspecified: Secondary | ICD-10-CM | POA: Diagnosis not present

## 2018-09-13 DIAGNOSIS — I4821 Permanent atrial fibrillation: Secondary | ICD-10-CM | POA: Diagnosis present

## 2018-09-13 DIAGNOSIS — Z833 Family history of diabetes mellitus: Secondary | ICD-10-CM

## 2018-09-13 DIAGNOSIS — I272 Pulmonary hypertension, unspecified: Secondary | ICD-10-CM | POA: Diagnosis present

## 2018-09-13 DIAGNOSIS — Z9842 Cataract extraction status, left eye: Secondary | ICD-10-CM

## 2018-09-13 DIAGNOSIS — Z952 Presence of prosthetic heart valve: Secondary | ICD-10-CM

## 2018-09-13 DIAGNOSIS — Z7901 Long term (current) use of anticoagulants: Secondary | ICD-10-CM | POA: Diagnosis not present

## 2018-09-13 DIAGNOSIS — E785 Hyperlipidemia, unspecified: Secondary | ICD-10-CM | POA: Diagnosis present

## 2018-09-13 DIAGNOSIS — I9589 Other hypotension: Secondary | ICD-10-CM | POA: Diagnosis present

## 2018-09-13 DIAGNOSIS — Z7952 Long term (current) use of systemic steroids: Secondary | ICD-10-CM

## 2018-09-13 DIAGNOSIS — D631 Anemia in chronic kidney disease: Secondary | ICD-10-CM | POA: Diagnosis not present

## 2018-09-13 DIAGNOSIS — E8779 Other fluid overload: Secondary | ICD-10-CM | POA: Diagnosis not present

## 2018-09-13 DIAGNOSIS — Z9689 Presence of other specified functional implants: Secondary | ICD-10-CM

## 2018-09-13 DIAGNOSIS — Z1159 Encounter for screening for other viral diseases: Secondary | ICD-10-CM

## 2018-09-13 DIAGNOSIS — Z905 Acquired absence of kidney: Secondary | ICD-10-CM

## 2018-09-13 DIAGNOSIS — I132 Hypertensive heart and chronic kidney disease with heart failure and with stage 5 chronic kidney disease, or end stage renal disease: Secondary | ICD-10-CM | POA: Diagnosis present

## 2018-09-13 DIAGNOSIS — Z9889 Other specified postprocedural states: Secondary | ICD-10-CM | POA: Diagnosis not present

## 2018-09-13 DIAGNOSIS — Z23 Encounter for immunization: Secondary | ICD-10-CM | POA: Diagnosis not present

## 2018-09-13 DIAGNOSIS — R918 Other nonspecific abnormal finding of lung field: Secondary | ICD-10-CM | POA: Diagnosis not present

## 2018-09-13 DIAGNOSIS — Z9841 Cataract extraction status, right eye: Secondary | ICD-10-CM

## 2018-09-13 DIAGNOSIS — Z888 Allergy status to other drugs, medicaments and biological substances status: Secondary | ICD-10-CM | POA: Diagnosis not present

## 2018-09-13 DIAGNOSIS — I509 Heart failure, unspecified: Secondary | ICD-10-CM | POA: Diagnosis not present

## 2018-09-13 DIAGNOSIS — G4733 Obstructive sleep apnea (adult) (pediatric): Secondary | ICD-10-CM | POA: Diagnosis present

## 2018-09-13 DIAGNOSIS — I340 Nonrheumatic mitral (valve) insufficiency: Secondary | ICD-10-CM | POA: Diagnosis present

## 2018-09-13 DIAGNOSIS — D696 Thrombocytopenia, unspecified: Secondary | ICD-10-CM | POA: Diagnosis not present

## 2018-09-13 DIAGNOSIS — D539 Nutritional anemia, unspecified: Secondary | ICD-10-CM | POA: Diagnosis not present

## 2018-09-13 DIAGNOSIS — Z992 Dependence on renal dialysis: Secondary | ICD-10-CM

## 2018-09-13 DIAGNOSIS — I25118 Atherosclerotic heart disease of native coronary artery with other forms of angina pectoris: Secondary | ICD-10-CM | POA: Diagnosis not present

## 2018-09-13 DIAGNOSIS — Z8249 Family history of ischemic heart disease and other diseases of the circulatory system: Secondary | ICD-10-CM

## 2018-09-13 DIAGNOSIS — K219 Gastro-esophageal reflux disease without esophagitis: Secondary | ICD-10-CM | POA: Diagnosis present

## 2018-09-13 DIAGNOSIS — I878 Other specified disorders of veins: Secondary | ICD-10-CM | POA: Diagnosis present

## 2018-09-13 DIAGNOSIS — I459 Conduction disorder, unspecified: Secondary | ICD-10-CM | POA: Diagnosis not present

## 2018-09-13 DIAGNOSIS — R791 Abnormal coagulation profile: Secondary | ICD-10-CM | POA: Diagnosis present

## 2018-09-13 DIAGNOSIS — D62 Acute posthemorrhagic anemia: Secondary | ICD-10-CM | POA: Diagnosis not present

## 2018-09-13 DIAGNOSIS — J9811 Atelectasis: Secondary | ICD-10-CM | POA: Diagnosis not present

## 2018-09-13 DIAGNOSIS — I4892 Unspecified atrial flutter: Secondary | ICD-10-CM | POA: Diagnosis present

## 2018-09-13 DIAGNOSIS — E875 Hyperkalemia: Secondary | ICD-10-CM | POA: Diagnosis not present

## 2018-09-13 DIAGNOSIS — Z955 Presence of coronary angioplasty implant and graft: Secondary | ICD-10-CM

## 2018-09-13 DIAGNOSIS — I255 Ischemic cardiomyopathy: Secondary | ICD-10-CM | POA: Diagnosis present

## 2018-09-13 DIAGNOSIS — I4891 Unspecified atrial fibrillation: Secondary | ICD-10-CM | POA: Diagnosis not present

## 2018-09-13 DIAGNOSIS — Z09 Encounter for follow-up examination after completed treatment for conditions other than malignant neoplasm: Secondary | ICD-10-CM

## 2018-09-13 DIAGNOSIS — Z940 Kidney transplant status: Secondary | ICD-10-CM | POA: Diagnosis not present

## 2018-09-13 DIAGNOSIS — H9190 Unspecified hearing loss, unspecified ear: Secondary | ICD-10-CM | POA: Diagnosis present

## 2018-09-13 DIAGNOSIS — I5023 Acute on chronic systolic (congestive) heart failure: Secondary | ICD-10-CM | POA: Diagnosis not present

## 2018-09-13 DIAGNOSIS — I482 Chronic atrial fibrillation, unspecified: Secondary | ICD-10-CM | POA: Diagnosis not present

## 2018-09-13 DIAGNOSIS — Z4659 Encounter for fitting and adjustment of other gastrointestinal appliance and device: Secondary | ICD-10-CM | POA: Diagnosis not present

## 2018-09-13 DIAGNOSIS — Z01810 Encounter for preprocedural cardiovascular examination: Secondary | ICD-10-CM | POA: Diagnosis not present

## 2018-09-13 LAB — COMPREHENSIVE METABOLIC PANEL
ALT: 28 U/L (ref 0–44)
AST: 36 U/L (ref 15–41)
Albumin: 3.7 g/dL (ref 3.5–5.0)
Alkaline Phosphatase: 200 U/L — ABNORMAL HIGH (ref 38–126)
Anion gap: 14 (ref 5–15)
BUN: 21 mg/dL (ref 8–23)
CO2: 30 mmol/L (ref 22–32)
Calcium: 8.7 mg/dL — ABNORMAL LOW (ref 8.9–10.3)
Chloride: 95 mmol/L — ABNORMAL LOW (ref 98–111)
Creatinine, Ser: 6.24 mg/dL — ABNORMAL HIGH (ref 0.61–1.24)
GFR calc Af Amer: 10 mL/min — ABNORMAL LOW (ref >60)
GFR calc non Af Amer: 9 mL/min — ABNORMAL LOW (ref >60)
Glucose, Bld: 106 mg/dL — ABNORMAL HIGH (ref 70–99)
Potassium: 4.4 mmol/L (ref 3.5–5.1)
Sodium: 139 mmol/L (ref 135–145)
Total Bilirubin: 0.6 mg/dL (ref 0.3–1.2)
Total Protein: 7.3 g/dL (ref 6.5–8.1)

## 2018-09-13 LAB — CBC WITH DIFFERENTIAL/PLATELET
Abs Immature Granulocytes: 0.02 10*3/uL (ref 0.00–0.07)
Basophils Absolute: 0 10*3/uL (ref 0.0–0.1)
Basophils Relative: 0 %
Eosinophils Absolute: 0.1 10*3/uL (ref 0.0–0.5)
Eosinophils Relative: 1 %
HCT: 31.5 % — ABNORMAL LOW (ref 39.0–52.0)
Hemoglobin: 10.3 g/dL — ABNORMAL LOW (ref 13.0–17.0)
Immature Granulocytes: 0 %
Lymphocytes Relative: 30 %
Lymphs Abs: 1.9 10*3/uL (ref 0.7–4.0)
MCH: 35.6 pg — ABNORMAL HIGH (ref 26.0–34.0)
MCHC: 32.7 g/dL (ref 30.0–36.0)
MCV: 109 fL — ABNORMAL HIGH (ref 80.0–100.0)
Monocytes Absolute: 0.6 10*3/uL (ref 0.1–1.0)
Monocytes Relative: 9 %
Neutro Abs: 3.9 10*3/uL (ref 1.7–7.7)
Neutrophils Relative %: 60 %
Platelets: 177 10*3/uL (ref 150–400)
RBC: 2.89 MIL/uL — ABNORMAL LOW (ref 4.22–5.81)
RDW: 14 % (ref 11.5–15.5)
WBC: 6.5 10*3/uL (ref 4.0–10.5)
nRBC: 0 % (ref 0.0–0.2)

## 2018-09-13 LAB — PROTIME-INR
INR: 1.7 — ABNORMAL HIGH (ref 0.8–1.2)
Prothrombin Time: 19.8 seconds — ABNORMAL HIGH (ref 11.4–15.2)

## 2018-09-13 LAB — SARS CORONAVIRUS 2 BY RT PCR (HOSPITAL ORDER, PERFORMED IN ~~LOC~~ HOSPITAL LAB): SARS Coronavirus 2: NEGATIVE

## 2018-09-13 MED ORDER — PRAVASTATIN SODIUM 40 MG PO TABS
40.0000 mg | ORAL_TABLET | Freq: Every day | ORAL | Status: DC
  Administered 2018-09-14 – 2018-09-20 (×6): 40 mg via ORAL
  Filled 2018-09-13 (×6): qty 1

## 2018-09-13 MED ORDER — PREDNISONE 10 MG PO TABS
5.0000 mg | ORAL_TABLET | Freq: Every day | ORAL | Status: DC
  Administered 2018-09-14 – 2018-09-22 (×8): 5 mg via ORAL
  Filled 2018-09-13 (×8): qty 1

## 2018-09-13 MED ORDER — ALLOPURINOL 300 MG PO TABS
300.0000 mg | ORAL_TABLET | Freq: Every day | ORAL | Status: DC
  Administered 2018-09-14 – 2018-09-22 (×8): 300 mg via ORAL
  Filled 2018-09-13 (×8): qty 1

## 2018-09-13 MED ORDER — MIDODRINE HCL 5 MG PO TABS
10.0000 mg | ORAL_TABLET | ORAL | Status: DC
  Administered 2018-09-15: 10 mg via ORAL
  Filled 2018-09-13: qty 2

## 2018-09-13 MED ORDER — CINACALCET HCL 30 MG PO TABS
30.0000 mg | ORAL_TABLET | Freq: Every day | ORAL | Status: DC
  Administered 2018-09-14 – 2018-09-21 (×7): 30 mg via ORAL
  Filled 2018-09-13 (×9): qty 1

## 2018-09-13 MED ORDER — ASPIRIN EC 81 MG PO TBEC
81.0000 mg | DELAYED_RELEASE_TABLET | Freq: Every day | ORAL | Status: DC
  Administered 2018-09-14 – 2018-09-15 (×2): 81 mg via ORAL
  Filled 2018-09-13 (×2): qty 1

## 2018-09-13 MED ORDER — NITROGLYCERIN 0.4 MG SL SUBL: 0.4000 mg | SUBLINGUAL_TABLET | SUBLINGUAL | Status: DC | PRN

## 2018-09-13 MED ORDER — FLUOXETINE HCL 10 MG PO CAPS
10.0000 mg | ORAL_CAPSULE | Freq: Every day | ORAL | Status: DC
  Administered 2018-09-14 – 2018-09-22 (×8): 10 mg via ORAL
  Filled 2018-09-13 (×9): qty 1

## 2018-09-13 MED ORDER — HEPARIN (PORCINE) 25000 UT/250ML-% IV SOLN
1250.0000 [IU]/h | INTRAVENOUS | Status: DC
  Administered 2018-09-13: 21:00:00 1100 [IU]/h via INTRAVENOUS
  Administered 2018-09-14 – 2018-09-15 (×2): 1250 [IU]/h via INTRAVENOUS
  Filled 2018-09-13 (×3): qty 250

## 2018-09-13 MED ORDER — ACETAMINOPHEN 325 MG PO TABS: 650.0000 mg | ORAL_TABLET | ORAL | Status: DC | PRN

## 2018-09-13 MED ORDER — ONDANSETRON HCL 4 MG/2ML IJ SOLN
4.0000 mg | Freq: Four times a day (QID) | INTRAMUSCULAR | Status: DC | PRN
  Administered 2018-09-16: 4 mg via INTRAVENOUS

## 2018-09-13 MED ORDER — PANTOPRAZOLE SODIUM 40 MG PO TBEC
40.0000 mg | DELAYED_RELEASE_TABLET | Freq: Every day | ORAL | Status: DC
  Administered 2018-09-14 – 2018-09-15 (×2): 40 mg via ORAL
  Filled 2018-09-13 (×2): qty 1

## 2018-09-13 MED ORDER — SEVELAMER CARBONATE 800 MG PO TABS
1600.0000 mg | ORAL_TABLET | Freq: Three times a day (TID) | ORAL | Status: DC
  Administered 2018-09-14 – 2018-09-15 (×5): 1600 mg via ORAL
  Filled 2018-09-13 (×5): qty 2

## 2018-09-13 MED ORDER — RENA-VITE PO TABS
1.0000 | ORAL_TABLET | Freq: Every day | ORAL | Status: DC
  Administered 2018-09-14 – 2018-09-21 (×8): 1 via ORAL
  Filled 2018-09-13 (×8): qty 1

## 2018-09-13 MED ORDER — LIDOCAINE-PRILOCAINE 2.5-2.5 % EX CREA: 1.0000 "application " | TOPICAL_CREAM | CUTANEOUS | Status: DC | PRN

## 2018-09-13 MED ORDER — AMIODARONE HCL 200 MG PO TABS
200.0000 mg | ORAL_TABLET | Freq: Two times a day (BID) | ORAL | Status: DC
  Administered 2018-09-14 – 2018-09-22 (×16): 200 mg via ORAL
  Filled 2018-09-13 (×16): qty 1

## 2018-09-13 NOTE — Telephone Encounter (Signed)
[facility] patient placement since the pt has not received a call.  Spoke with Joycelyn Schmid. The pt has a bed assignment 4East 21.  Spoke with the  Pt and made him aware. Pt will make his way to [facility] now. Pt voiced appreciation for the assistance.

## 2018-09-13 NOTE — Telephone Encounter (Signed)
Patient calling about admission to [facility] for procedure.  He states he has not received a call from [facility] and was supposed to be admitted today   Please call to discuss plan

## 2018-09-13 NOTE — H&P (Signed)
Cardiology Admission History and Physical:   Patient ID: Jeremy [name] MRN: [id number]; DOB: [birth year]   Admission date: (Not on file)  Primary Care Provider: Leone Haven, MD Primary Cardiologist: Kathlyn Sacramento, MD Chief Complaint:  CABG admission   Patient Profile:  63 y/o male with hx of multivessel CAD pending CABG, Aortic stenosis s/p mech AVR (chronic coumadin), permanent Afib, IgA nephropathy s/p transplant & renal cell carcinoma s/p bilat nephrectomy, now ESRD on HD, HTN, HL, OSA, and anemia of chronic dizziness who presentd for CABG.   In the setting of worsening angina, he underwent myoview stress testing in 02/2018 @ [facility], which showed a large inf, infsept, and inflat defect w/ minimal reversibility.  EF was 30%, which was new.  F/u echo confirmed LV dysfxn, w/ EF of 35% and mod reduced RV fxn.  He underwent cath in 06/2018, which showed severe LAD and RCA/RPAV/RPDA dzs and was subsequently eval by CT surgery with recommendation for CABG. Unfortunately, this was cancelled in the setting of the Covid-19 pandemic.  Beta-blocker discontinued in the setting of hypotension previously.   History of Present Illness:   [name] recently dealing with angina and atrial fibrillation requiring addition of amiodarone with improved rate.  Avoided cardioversion in setting of subtherapeutic INR.   Seen by Dr. Latanya Maudlin 09/09/18 and scheduled for surgery 6/11 and recommended bridging.   Here for perioperative heparin bridging prior to CABG in the setting of chronic Coumadin and mechanical AVR.  The patient denies nausea, vomiting, fever, chest pain, palpitations, shortness of breath, orthopnea, PND, dizziness, syncope, cough, congestion, abdominal pain, hematochezia, melena, lower extremity edema.  Pending COVID.    Past Medical History:  Diagnosis Date  . 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 Aortic stenosis    a. 2014 s/p mech AVR ([facility])-->chronic coumadin; b. 08/2018 Echo:  Mild AS/AI. Nl fxn/gradient.  . Arthritis   . CAD (coronary artery disease)    a. 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 02/2018 MV [facility]): large inf, infsept, inflat infarct w/ min rev, EF 30%; c. 06/2018 Cath: LM min irregs, LAD 60p, 95/30/72m, LCX min irregs, RCA 40p, 55m, 90d, RPAV 90, RPDA 95ost-->pending CABG (delayed 2/2 Covid).  . Chronic anticoagulation 06/2012   a. Coumadin in the setting of mech AVR and afib.  . Dialysis patient [facility]) 01 01 2008  . ESRD (end stage renal disease) ([place])    a. 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 1999 s/p Kidney transplant; c. 2014 Bilat nephrectomy (transplanted kidney resected) in the setting of renal cell carcinoma; d. PD until 11/2017-->HD.  Marland Kitchen HFrEF (heart failure with reduced ejection fraction) ([place])    a. 07/2016 Echo: EF 55-60%; b. 02/2018 MV: EF 30%; c. 04/2018 Echo: EF 35%; d. 08/2018 Echo: EF 30-35%. Glob HK w/o rwma. Mildly reduced RV fxn. Mod to sev dil LA. Nl AoV fxn.  Marland Kitchen Hx of colonoscopy    2009 by Dr. Laural Golden. Normal except for small submucosal lipoma. No evidence of polyps or colitis.  . Hyperlipidemia   . Hypertension   . Hypogonadism in male 07/25/2015  . Ischemic cardiomyopathy    a. 07/2016 Echo: EF 55-60%; b. 02/2018 MV: EF 30%; c. 04/2018 Echo: EF 35%; d. 08/2018 Echo: EF 30-35%.  . OSA (obstructive sleep apnea)    No CPAP  . Permanent atrial fibrillation    a. CHA2DS2VASc = 3-->Coumadin (also mech AVR);  b. 06/2018 Amio started 2/2 elevated rates.  . Renal cell carcinoma ([place]) 08/04/2016    Past Surgical History:  Procedure Laterality Date  . AORTIC VALVE REPLACEMENT  3 /11 /2014   At [place]    . CAPD INSERTION N/A 02/19/2017   Procedure: LAPAROSCOPIC INSERTION CONTINUOUS AMBULATORY PERITONEAL DIALYSIS  (CAPD) CATHETER;  Surgeon: Algernon Huxley, MD;  Location: [facility] ORS;  Service: General;  Laterality: N/A;  . CORONARY  ANGIOPLASTY WITH STENT PLACEMENT    . DG AV DIALYSIS  SHUNT ACCESS EXIST*L* OR     left arm  . EPIGASTRIC HERNIA REPAIR N/A 02/19/2017   Procedure: HERNIA REPAIR EPIGASTRIC ADULT;  Surgeon: Robert Bellow, MD;  Location: [facility] ORS;  Service: General;  Laterality: N/A;  . EYE SURGERY     bilateral cataract  . HERNIA REPAIR     UMBILICAL HERNIA  . HERNIA REPAIR  02/19/2017  . INNER EAR SURGERY     20 years ago  . KIDNEY TRANSPLANT    . Peritoneal Dialysis access placement  02/19/2017  . REMOVAL OF A DIALYSIS CATHETER N/A 11/23/2017   Procedure: REMOVAL OF A PERITONEAL DIALYSIS CATHETER;  Surgeon: Algernon Huxley, MD;  Location: [facility] ORS;  Service: Vascular;  Laterality: N/A;  . RIGHT/LEFT HEART CATH AND CORONARY ANGIOGRAPHY Bilateral 06/07/2018   Procedure: RIGHT/LEFT HEART CATH AND CORONARY ANGIOGRAPHY;  Surgeon: Wellington Hampshire, MD;  Location: [facility];  Service: Cardiovascular;  Laterality: Bilateral;  . TEE WITHOUT CARDIOVERSION N/A 07/21/2016   Procedure: TRANSESOPHAGEAL ECHOCARDIOGRAM (TEE);  Surgeon: Wellington Hampshire, MD;  Location: [facility] ORS;  Service: Cardiovascular;  Laterality: N/A;     Medications Prior to Admission: Prior to Admission medications   Medication Sig Start Date End Date Taking? Authorizing Provider  acetaminophen (TYLENOL) 500 MG tablet Take 1,000 mg 2 (two) times daily as needed by mouth for moderate pain.   Yes [provider]  allopurinol (ZYLOPRIM) 300 MG tablet Take 300 mg by mouth daily. 12/23/17  Yes [provider]  amiodarone (PACERONE) 200 MG tablet Take 1 tablet (200 mg total) by mouth 2 (two) times daily. 08/05/18  Yes Wellington Hampshire, MD  cinacalcet (SENSIPAR) 30 MG tablet Take 30 mg by mouth daily.    Yes [provider]  Dextrose-Fructose-Sod Citrate (NAUZENE PO) Take 1-2 tablets 2 (two) times daily as needed by mouth (nausea).   Yes [provider]  FLUoxetine (PROZAC) 10 MG capsule TAKE 1 CAPSULE BY  MOUTH EVERY DAY 07/14/18  Yes Leone Haven, MD  folic acid-vitamin b complex-vitamin c-selenium-zinc (DIALYVITE) 3 MG TABS tablet Take 1 tablet by mouth daily.   Yes [provider]  lidocaine-prilocaine (EMLA) cream Apply 1 application topically as needed (for fistula).   Yes [provider]  midodrine (PROAMATINE) 10 MG tablet Take 10 mg by mouth See admin instructions. Take on Monday, Wednesday and Friday before dialysis 07/30/18  Yes [provider]  nitroGLYCERIN (NITROSTAT) 0.4 MG SL tablet PLACE 1 TABLET (0.4 MG TOTAL) UNDER THE TONGUE EVERY 5 (FIVE) MINUTES AS NEEDED FOR CHEST PAIN. 08/09/18  Yes Leone Haven, MD  omeprazole (PRILOSEC) 20 MG capsule Take 20 mg by mouth daily.   Yes [provider]  pravastatin (PRAVACHOL) 10 MG tablet Take 1 tablet (10 mg) by mouth once daily 06/11/18  Yes [name], Mertie Clause, MD  predniSONE (DELTASONE) 5 MG tablet Take 5 mg by mouth daily with breakfast.  10/12/16  Yes [provider]  sevelamer carbonate (RENVELA) 800 MG tablet Take 40,000 mg by mouth 3 (three) times daily with meals. 5 tabs per meal   Yes [provider]  warfarin (COUMADIN) 1 MG tablet Take 1 tablet (1 mg total) by mouth daily at 6 PM. Patient taking differently: Take 1-2 mg by mouth See admin instructions. 1 mg all days , except 2 mg on Sunday 08/05/18  Yes Wellington Hampshire, MD  cephALEXin (KEFLEX) 250 MG capsule Take 1 capsule (250 mg total) by mouth 2 (two) times daily. Patient not taking: Reported on 09/13/2018 08/14/18   Leone Haven, MD     Allergies:    Allergies  Allergen Reactions  . Statins     Other reaction(s): Arthralgias (intolerance), Myalgias (intolerance)  Pt taking pravastatin   . Sertraline Rash    Social History:   Social History   Socioeconomic History  . Marital status: Significant Other    Spouse name: Not on file  . Number of children: Not on file  . Years of education: Not on file  . Highest  education level: High school graduate  Occupational History  . Occupation: courier    Comment: [employer]  . Financial resource strain: Not hard at all  . Food insecurity:    Worry: Never true    Inability: Never true  . Transportation needs:    Medical: No    Non-medical: No  Tobacco Use  . Smoking status: Never Smoker  . Smokeless tobacco: Never Used  Substance and Sexual Activity  . Alcohol use: No  . Drug use: No  . Sexual activity: Not Currently    Partners: Female  Lifestyle  . Physical activity:    Days per week: 0 days    Minutes per session: Not on file  . Stress: Not on file  Relationships  . Social connections:    Talks on phone: More than three times a week    Gets together: Once a week    Attends religious service: 1 to 4 times per year    Active member of club or organization: Not on file    Attends meetings of clubs or organizations: Not on file    Relationship status: Living with partner  . Intimate partner violence:    Fear of current or ex partner: No    Emotionally abused: No    Physically abused: No    Forced sexual activity: No  Other Topics Concern  . Not on file  Social History Narrative   Single   1 daughter 64     Family History:  The patient's family history includes Cancer in his mother; Diabetes in his father; Heart disease in his father.    ROS:  Please see the history of present illness.  All other ROS reviewed and negative.     Physical Exam/Data:  There were no vitals filed for this visit. No intake or output data in the 24 hours ending 09/13/18 1655 Last 3 Weights 09/09/2018 08/18/2018 08/16/2018  Weight (lbs) 187 lb 192 lb 10.9 oz 187 lb 6.4 oz  Weight (kg) 84.823 kg 87.4 kg 85.004 kg     There is no height or weight on file to calculate BMI.  General:  Well nourished, well developed, in no acute distress HEENT: normal Lymph: no adenopathy Neck: no JVD Endocrine:  No thryomegaly Vascular: No carotid  bruits; FA pulses 2+ bilaterally without bruits  Cardiac:  normal S1,  S2; irregular; + murmur Lungs:  clear to auscultation bilaterally, no wheezing, rhonchi or rales  Abd: soft, nontender, no hepatomegaly  Ext: no  edema Musculoskeletal:  No deformities, BUE and BLE strength normal and equal Skin: warm and dry  Neuro:  CNs 2-12 intact, no focal abnormalities noted Psych:  Normal affect    EKG:  The ECG that was done 06/01/18 was personally reviewed and demonstrates: Afib at rate of 80 bpm  Relevant CV Studies:  Echo 08/12/2018 IMPRESSIONS   1. The left ventricle has moderate-severely reduced systolic function, with an ejection fraction of 30-35%. The cavity size was normal. There is mildly increased left ventricular wall thickness. Left ventricular diastolic Doppler parameters are  indeterminate. Left ventrical global hypokinesis without regional wall motion abnormalities.  2. The right ventricle has mildly reduced systolic function. The cavity was mildly enlarged. There is no increase in right ventricular wall thickness.  3. Left atrial size was moderate to severely dilated.  4. A 76mm [employer] Jude mechanical prosthesis valve is present in the aortic position. Procedure Date: 06/15/12 Normal aortic valve prosthesis.Mean gradient within normal range for prosthetic valve   RIGHT/LEFT HEART CATH AND CORONARY ANGIOGRAPHY 06/07/2018  Conclusion     Prox LAD lesion is 60% stenosed.  Mid LAD-2 lesion is 30% stenosed.  Mid LAD-1 lesion is 95% stenosed.  Mid LAD to Dist LAD lesion is 60% stenosed.  Prox RCA lesion is 40% stenosed.  Mid RCA lesion is 70% stenosed.  Dist RCA lesion is 90% stenosed with 90% stenosed side branch in Post Atrio.  Ost RPDA to RPDA lesion is 95% stenosed.   1.  Severe heavily calcified two-vessel coronary artery disease affecting the LAD and right coronary artery.  The disease is not optimal for atherectomy given significant tortuosity in the proximal LAD  segment and diffuse disease affecting the right coronary artery extending into the bifurcation. 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 2.  Right heart catheterization showed mildly elevated filling pressures, moderate pulmonary hypertension and normal cardiac output.  Recommendations: Recommend evaluation for CABG given diffuse heavily calcified disease. Resume warfarin today if no bleeding issues from the groin. Continue aggressive treatment of risk factors.     Laboratory Data:  As above  Radiology/Studies:  No results found.  Assessment and Plan:   1. CAD multi vessels disease No chest pain . Continue ASA and statin.   2. ESRD on HD (MWF) - Had dialysis this morning. Consult nephrology tomorrow.   3. Hx of mechanical aortic valve - Normal aortic valve prosthesis with mean gradient by recent echo - will bridge with heparin while waiting for surgery  4. Permanent atrial fibrillation - Rate stable. Continue amiodarone. Off BB due to hypotension previously. Perioperative heparin bridging.  5. ICM/Chronic systolic CHF - LVEF of 11-91% by recent echo - On midodrine for soft blood pressure  6. HLD - 62 on May 2019. - recheck lab - Hx of statin intolerance. On Pravastatin.   Severity of Illness: The appropriate patient status for this patient is INPATIENT. Inpatient status is judged to be reasonable and necessary in order to provide the required intensity of service to ensure the patient's safety. The patient's presenting symptoms, physical exam findings, and initial radiographic and laboratory data in the context of their chronic comorbidities is felt to place them at high risk for further clinical deterioration. Furthermore, it is not anticipated that the patient will be medically stable  for discharge from the hospital within 2 midnights of admission. The following factors support the patient status of  inpatient.   " The patient's presenting symptoms include for CABG. " The worrisome physical exam findings include N/A " The initial radiographic and laboratory data are worrisome because of severe CAD " The chronic co-morbidities include afib, AVR   * I certify that at the point of admission it is my clinical judgment that the patient will require inpatient hospital care spanning beyond 2 midnights from the point of admission due to high intensity of service, high risk for further deterioration and high frequency of surveillance required.*    For questions or updates, please contact [place] Please consult [email or web address] for contact info under        Jarrett Soho, PA  09/13/2018 4:55 PM

## 2018-09-13 NOTE — Progress Notes (Signed)
ANTICOAGULATION CONSULT NOTE - Initial Consult  Pharmacy Consult for Heparin Indication:  Bridging for AVR prior to CABG  Medical history includes: hx of multivessel CAD, aortic stenosis (S/P mech AVR, on chronic warfarin), permanent Afib, IgA nephropathy and renal cell carcinoma (S/P bilateral nephrectomy and renal transplant ESRD on HD), HTN, hyperlipidemia, OSA, anemia of chronic disease. He is admitted for CABG.  Allergies  Allergen Reactions  . Statins     Other reaction(s): Arthralgias (intolerance), Myalgias (intolerance)  Pt taking pravastatin   . Sertraline Rash    Patient Measurements: Height: 5\' 11"  (180.3 cm) Weight: 186 lb 8.2 oz (84.6 kg) IBW/kg (Calculated) : 75.3 Heparin Dosing Weight: 84.6 kg  Vital Signs: Temp: 98.2 F (36.8 C) (06/08 1841) Temp Source: Oral (06/08 1841) BP: 108/65 (06/08 1841) Pulse Rate: 104 (06/08 1841)  Labs: Recent Labs    09/13/18 1904  HGB 10.3*  HCT 31.5*  PLT 177  LABPROT 19.8*  INR 1.7*  CREATININE 6.24*    Estimated Creatinine Clearance: 13.2 mL/min (A) (by C-G formula based on SCr of 6.24 mg/dL (H)).   Medical History: Past Medical History:  Diagnosis Date  . 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 Aortic stenosis    a. 2014 s/p mech AVR ([facility])-->chronic coumadin; b. 08/2018 Echo: Mild AS/AI. Nl fxn/gradient.  . Arthritis   . CAD (coronary artery disease)    a. 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 02/2018 MV [facility]): large inf, infsept, inflat infarct w/ min rev, EF 30%; c. 06/2018 Cath: LM min irregs, LAD 60p, 95/30/80m, LCX min irregs, RCA 40p, 26m, 90d, RPAV 90, RPDA 95ost-->pending CABG (delayed 2/2 Covid).  . Chronic anticoagulation 06/2012   a. Coumadin in the setting of mech AVR and afib.  . Dialysis patient [facility]) 01 01 2008  . ESRD (end stage renal disease) ([place])    a. CKD 2/2 to IgA nephropathy (dx age 68); b. 1999 s/p Kidney transplant;  c. 2014 Bilat nephrectomy (transplanted kidney resected) in the setting of renal cell carcinoma; d. PD until 11/2017-->HD.  Marland Kitchen HFrEF (heart failure with reduced ejection fraction) ([place])    a. 07/2016 Echo: EF 55-60%; b. 02/2018 MV: EF 30%; c. 04/2018 Echo: EF 35%; d. 08/2018 Echo: EF 30-35%. Glob HK w/o rwma. Mildly reduced RV fxn. Mod to sev dil LA. Nl AoV fxn.  Marland Kitchen Hx of colonoscopy    2009 by Dr. Laural Golden. Normal except for small submucosal lipoma. No evidence of polyps or colitis.  . Hyperlipidemia   . Hypertension   . Hypogonadism in male 07/25/2015  . Ischemic cardiomyopathy    a. 07/2016 Echo: EF 55-60%; b. 02/2018 MV: EF 30%; c. 04/2018 Echo: EF 35%; d. 08/2018 Echo: EF 30-35%.  . OSA (obstructive sleep apnea)    No CPAP  . Permanent atrial fibrillation    a. CHA2DS2VASc = 3-->Coumadin (also mech AVR); b. 06/2018 Amio started 2/2 elevated rates.  . Renal cell carcinoma ([place]) 08/04/2016    Assessment: 63 yr old male with ESRD on warfarin for AVR, admitted for CABG. Pharmacy is consulted to dose/monitor bridging heparin therapy.  Home warfarin regimen: per med rec, pt takes warfarin 1 mg daily, except on Sunday, on which he takes 2 mg. INR on admission 6/8 is 1.7. Per pt, last dose of warfarin was last Friday.  Goal of Therapy:  Heparin level 0.3-0.7 units/ml Monitor platelets by anticoagulation protocol: Yes   Plan:  Initiate heparin infusion now (no bolus) at 1100 units/hr and check heparin level ~8 hrs after initiation of the infusion (with AM labs). Daily heparin level, CBC  Gillermina Hu, PharmD, BCPS, [facility] Clinical Pharmacist 09/13/2018,5:10 PM

## 2018-09-14 ENCOUNTER — Inpatient Hospital Stay (HOSPITAL_COMMUNITY): Payer: Medicare Other

## 2018-09-14 ENCOUNTER — Encounter (HOSPITAL_COMMUNITY): Payer: Self-pay | Admitting: Nephrology

## 2018-09-14 DIAGNOSIS — I482 Chronic atrial fibrillation, unspecified: Secondary | ICD-10-CM

## 2018-09-14 DIAGNOSIS — N186 End stage renal disease: Secondary | ICD-10-CM

## 2018-09-14 DIAGNOSIS — I2510 Atherosclerotic heart disease of native coronary artery without angina pectoris: Secondary | ICD-10-CM

## 2018-09-14 DIAGNOSIS — Z992 Dependence on renal dialysis: Secondary | ICD-10-CM

## 2018-09-14 DIAGNOSIS — Z952 Presence of prosthetic heart valve: Secondary | ICD-10-CM

## 2018-09-14 DIAGNOSIS — I25118 Atherosclerotic heart disease of native coronary artery with other forms of angina pectoris: Principal | ICD-10-CM

## 2018-09-14 LAB — BLOOD GAS, ARTERIAL
Acid-Base Excess: 6 mmol/L — ABNORMAL HIGH (ref 0.0–2.0)
Bicarbonate: 30.1 mmol/L — ABNORMAL HIGH (ref 20.0–28.0)
Drawn by: 41875
FIO2: 21
O2 Saturation: 95.5 %
Patient temperature: 98.6
pCO2 arterial: 44.8 mmHg (ref 32.0–48.0)
pH, Arterial: 7.442 (ref 7.350–7.450)
pO2, Arterial: 78.7 mmHg — ABNORMAL LOW (ref 83.0–108.0)

## 2018-09-14 LAB — CBC
HCT: 31 % — ABNORMAL LOW (ref 39.0–52.0)
Hemoglobin: 9.9 g/dL — ABNORMAL LOW (ref 13.0–17.0)
MCH: 35 pg — ABNORMAL HIGH (ref 26.0–34.0)
MCHC: 31.9 g/dL (ref 30.0–36.0)
MCV: 109.5 fL — ABNORMAL HIGH (ref 80.0–100.0)
Platelets: 135 10*3/uL — ABNORMAL LOW (ref 150–400)
RBC: 2.83 MIL/uL — ABNORMAL LOW (ref 4.22–5.81)
RDW: 13.7 % (ref 11.5–15.5)
WBC: 5.5 10*3/uL (ref 4.0–10.5)
nRBC: 0 % (ref 0.0–0.2)

## 2018-09-14 LAB — HEPARIN LEVEL (UNFRACTIONATED)
Heparin Unfractionated: 0.21 IU/mL — ABNORMAL LOW (ref 0.30–0.70)
Heparin Unfractionated: 0.38 IU/mL (ref 0.30–0.70)

## 2018-09-14 LAB — SURGICAL PCR SCREEN
MRSA, PCR: NEGATIVE
Staphylococcus aureus: POSITIVE — AB

## 2018-09-14 LAB — HIV ANTIBODY (ROUTINE TESTING W REFLEX): HIV Screen 4th Generation wRfx: NONREACTIVE

## 2018-09-14 MED ORDER — CHLORHEXIDINE GLUCONATE CLOTH 2 % EX PADS
6.0000 | MEDICATED_PAD | Freq: Every day | CUTANEOUS | Status: DC
  Administered 2018-09-16: 06:00:00 6 via TOPICAL

## 2018-09-14 NOTE — Consult Note (Signed)
Renal Service Consult Note [facility]  Jeremy [name] 09/14/2018 [place] Requesting Physician: Dr Roxan Hockey  Reason for Consult:  ESRD pt admitted for surgery HPI: The patient is a 63 y.o. year-old w/ hx of atrial fib, OSA, ICM, HTN, HL, systolic CHF, ESRD on HD, CAD and hx RCC, hx of AoVR who is admitted for CABG.  Asked to see for dialysis.   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  He dialyzes w/ [facility] in [place] on [place] on a MWF schedule. Had HD yesterday.  Surgery is planned for Thursday.  Was admitted early for IV heparin bridging because of his prosthetic aortic valve.   Patient started HD around 1996,  had 1st transplant LLQ around 1998 until 2007 when they found renal cell cancer in transplant and both native kidneys, all were removed > went back on HD until 2nd transplant RLQ in 2014 which lasted 3 yrs; it was embolized for suspected acute rejection. Is currently on HD MWF in [place] w/ [facility] nephrology.    Has no c/o today, had usual HD yest, has L FA AVF.    ROS  denies CP  no joint pain   no HA  no blurry vision  no rash  no diarrhea  no nausea/ vomiting   Past Medical History  Past Medical History:  Diagnosis Date  . 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 Aortic stenosis    a. 2014 s/p [facility]ch AVR ([facility])-->chronic coumadin; b. 08/2018 Echo: Mild AS/AI. Nl fxn/gradient.  . Arthritis   . CAD (coronary artery disease)    a. 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 02/2018 MV [facility]): large inf, infsept, inflat infarct w/ min rev, EF 30%; c. 06/2018 Cath: LM min irregs, LAD 60p, 95/30/29m, LCX min irregs, RCA 40p, 31m, 90d, RPAV 90, RPDA 95ost-->pending CABG (delayed 2/2 Covid).  . Chronic anticoagulation 06/2012   a. Coumadin in the setting of mech AVR and afib.  . Dialysis patient [facility]) 01 01 2008  . ESRD (end stage renal disease)  ([place])    a. 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 1999 s/p Kidney transplant; c. 2014 Bilat nephrectomy (transplanted kidney resected) in the setting of renal cell carcinoma; d. PD until 11/2017-->HD.  Marland Kitchen HFrEF (heart failure with reduced ejection fraction) ([place])    a. 07/2016 Echo: EF 55-60%; b. 02/2018 MV: EF 30%; c. 04/2018 Echo: EF 35%; d. 08/2018 Echo: EF 30-35%. Glob HK w/o rwma. Mildly reduced RV fxn. Mod to sev dil LA. Nl AoV fxn.  Marland Kitchen Hx of colonoscopy    2009 by Dr. Laural Golden. Normal except for small submucosal lipoma. No evidence of polyps or colitis.  . Hyperlipidemia   . Hypertension   . Hypogonadism in male 07/25/2015  . Ischemic cardiomyopathy    a. 07/2016 Echo: EF 55-60%; b. 02/2018 MV: EF 30%; c. 04/2018 Echo: EF 35%; d. 08/2018 Echo: EF 30-35%.  . OSA (obstructive sleep apnea)    No CPAP  . Permanent atrial fibrillation    a. CHA2DS2VASc = 3-->Coumadin (also mech AVR); b. 06/2018 Amio started 2/2 elevated rates.  . Renal cell carcinoma ([place]) 08/04/2016   Past Surgical History  Past Surgical History:  Procedure Laterality Date  . AORTIC VALVE REPLACEMENT  3 /11 /2014   At [place]    .  CAPD INSERTION N/A 02/19/2017   Procedure: LAPAROSCOPIC INSERTION CONTINUOUS AMBULATORY PERITONEAL DIALYSIS  (CAPD) CATHETER;  Surgeon: Algernon Huxley, MD;  Location: [facility] ORS;  Service: General;  Laterality: N/A;  . CORONARY ANGIOPLASTY WITH STENT PLACEMENT    . DG AV DIALYSIS  SHUNT ACCESS EXIST*L* OR     left arm  . EPIGASTRIC HERNIA REPAIR N/A 02/19/2017   Procedure: HERNIA REPAIR EPIGASTRIC ADULT;  Surgeon: Teigen Parslow Bellow, MD;  Location: [facility] ORS;  Service: General;  Laterality: N/A;  . EYE SURGERY     bilateral cataract  . HERNIA REPAIR     UMBILICAL HERNIA  . HERNIA REPAIR  02/19/2017  . INNER EAR SURGERY     20 years ago  . KIDNEY TRANSPLANT    . Peritoneal Dialysis access placement  02/19/2017  . REMOVAL OF A DIALYSIS CATHETER N/A  11/23/2017   Procedure: REMOVAL OF A PERITONEAL DIALYSIS CATHETER;  Surgeon: Algernon Huxley, MD;  Location: [facility] ORS;  Service: Vascular;  Laterality: N/A;  . RIGHT/LEFT HEART CATH AND CORONARY ANGIOGRAPHY Bilateral 06/07/2018   Procedure: RIGHT/LEFT HEART CATH AND CORONARY ANGIOGRAPHY;  Surgeon: Wellington Hampshire, MD;  Location: [facility];  Service: Cardiovascular;  Laterality: Bilateral;  . TEE WITHOUT CARDIOVERSION N/A 07/21/2016   Procedure: TRANSESOPHAGEAL ECHOCARDIOGRAM (TEE);  Surgeon: Wellington Hampshire, MD;  Location: [facility] ORS;  Service: Cardiovascular;  Laterality: N/A;   Family History  Family History  Problem Relation Age of Onset  . Cancer Mother        pancreatic  . Heart disease Father   . Diabetes Father    Social History  reports that he has never smoked. He has never used smokeless tobacco. He reports that he does not drink alcohol or use drugs. Allergies  Allergies  Allergen Reactions  . Statins     Other reaction(s): Arthralgias (intolerance), Myalgias (intolerance)  Pt taking pravastatin   . Sertraline Rash   Home medications Prior to Admission medications   Medication Sig Start Date End Date Taking? Authorizing Provider  acetaminophen (TYLENOL) 500 MG tablet Take 1,000 mg 2 (two) times daily as needed by mouth for moderate pain.   Yes [provider]  allopurinol (ZYLOPRIM) 300 MG tablet Take 300 mg by mouth daily. 12/23/17  Yes [provider]  amiodarone (PACERONE) 200 MG tablet Take 1 tablet (200 mg total) by mouth 2 (two) times daily. 08/05/18  Yes Wellington Hampshire, MD  cinacalcet (SENSIPAR) 30 MG tablet Take 30 mg by mouth daily.    Yes [provider]  Dextrose-Fructose-Sod Citrate (NAUZENE PO) Take 1-2 tablets 2 (two) times daily as needed by mouth (nausea).   Yes [provider]  FLUoxetine (PROZAC) 10 MG capsule TAKE 1 CAPSULE BY MOUTH EVERY DAY 07/14/18  Yes Leone Haven, MD  folic acid-vitamin b  complex-vitamin c-selenium-zinc (DIALYVITE) 3 MG TABS tablet Take 1 tablet by mouth daily.   Yes [provider]  lidocaine-prilocaine (EMLA) cream Apply 1 application topically as needed (for fistula).   Yes [provider]  midodrine (PROAMATINE) 10 MG tablet Take 10 mg by mouth See admin instructions. Take on Monday, Wednesday and Friday before dialysis 07/30/18  Yes [provider]  nitroGLYCERIN (NITROSTAT) 0.4 MG SL tablet PLACE 1 TABLET (0.4 MG TOTAL) UNDER THE TONGUE EVERY 5 (FIVE) MINUTES AS NEEDED FOR CHEST PAIN. 08/09/18  Yes Leone Haven, MD  omeprazole (PRILOSEC) 20 MG capsule Take 20 mg by mouth daily.   Yes [provider]  pravastatin (PRAVACHOL) 10 MG tablet Take 1 tablet (10 mg) by mouth once daily 06/11/18  Yes Wellington Hampshire, MD  predniSONE (DELTASONE) 5 MG tablet Take 5 mg by mouth daily with breakfast.  10/12/16  Yes [provider]  sevelamer carbonate (RENVELA) 800 MG tablet Take 40,000 mg by mouth 3 (three) times daily with meals. 5 tabs per meal   Yes [provider]  warfarin (COUMADIN) 1 MG tablet Take 1 tablet (1 mg total) by mouth daily at 6 PM. Patient taking differently: Take 1-2 mg by mouth See admin instructions. 1 mg all days , except 2 mg on Sunday 08/05/18  Yes Wellington Hampshire, MD  cephALEXin (KEFLEX) 250 MG capsule Take 1 capsule (250 mg total) by mouth 2 (two) times daily. Patient not taking: Reported on 09/13/2018 08/14/18   Leone [place], MD   Liver Function Tests Recent Labs  Lab 09/13/18 1904  AST 36  ALT 28  ALKPHOS 200*  BILITOT 0.6  PROT 7.3  ALBUMIN 3.7   No results for input(s): LIPASE, AMYLASE in the last 168 hours. CBC Recent Labs  Lab 09/13/18 1904 09/14/18 0304  WBC 6.5 5.5  NEUTROABS 3.9  --   HGB 10.3* 9.9*  HCT 31.5* 31.0*  MCV 109.0* 109.5*  PLT 177 751*   Basic Metabolic Panel Recent Labs  Lab 09/13/18 1904  NA 139  K 4.4  CL 95*  CO2 30  GLUCOSE 106*  BUN  21  CREATININE 6.24*  CALCIUM 8.7*   Iron/TIBC/Ferritin/ %Sat    Component Value Date/Time   IRON 86 04/08/2017 1210   TIBC 234 (L) 04/08/2017 1210   FERRITIN 1,239 (H) 04/08/2017 1210   IRONPCTSAT 37 04/08/2017 1210    Vitals:   09/14/18 0900 09/14/18 1100 09/14/18 1125 09/14/18 1126  BP:   121/66   Pulse:   69 85  Resp: 12 16 14 13   Temp:   98.2 F (36.8 C)   TempSrc:   Oral   SpO2:   97% 98%  Weight:      Height:        Exam Gen alert, no distress, calm No rash, cyanosis or gangrene Sclera anicteric, throat clear  No jvd or bruits Chest clear bilat to bases RRR no MRG Abd soft ntnd no mass or ascites +bs GU normal male MS no joint effusions or deformity Ext no LE edema, no wounds or ulcers Neuro is alert, Ox 3 , nf LFA AVF+bruit    Home meds:  - amiodarone 200 bid/ midodrine 10-20 mg w/ HD mwf/ sl ntg prn/ warfarin as directed/ pravastatin 10 qd  - allopurinol 300 qd/ omeprazole 20  - prednisone 5 qd (arthritis)  - sevelamer carb 2 ac tid/ cinacalcet 30 qd  - fluoxetine 10 qd  - prn's/ vitamins/ supplements     OP Dialysis: [facility] MWF ([place]/ [facility])  3h 65min   450/800  84.5kg   Heparin none   R AVF     Assessment: 1. CAD h/o stent - admit for elective CABG sched for 6/11 2. ESRD on HD - MWF. HD tomorrow 3. Volume no excess on exam 4. Hypotension - chronic issues on pre HD midodrine 5. Atrial fib - on warfarin and amiodarone 6. HL 7. MCD ckd - cont binder/ sensipar    Plan: 1. HD tomorrow      Kelly Splinter  MD 09/14/2018, 12:32 PM

## 2018-09-14 NOTE — Progress Notes (Addendum)
      [address]       [place] [address]             [phone]       Procedure(s) (LRB): REDO STERNOTOMY (N/A) CORONARY ARTERY BYPASS GRAFTING (CABG) (N/A) TRANSESOPHAGEAL ECHOCARDIOGRAM (TEE) (N/A) Subjective: Resting comfortable in bed. No complaints. Denies chest pain or shortness of breath.   Objective: Vital signs in last 24 hours: Temp:  [97.7 F (36.5 C)-98.2 F (36.8 C)] 98.1 F (36.7 C) (06/09 0803) Pulse Rate:  [70-104] 70 (06/09 0803) Cardiac Rhythm: Atrial fibrillation;Atrial flutter (06/09 0834) Resp:  [13-19] 19 (06/09 0803) BP: (108-140)/(65-83) 140/83 (06/09 0803) SpO2:  [95 %-100 %] 99 % (06/09 0803) Weight:  [84.6 kg] 84.6 kg (06/08 2343)   Intake/Output from previous day: 06/08 0701 - 06/09 0700 In: 61.7 [I.V.:61.7] Out: -  Intake/Output this shift: No intake/output data recorded.  General appearance: alert, cooperative and no distress Neurologic: intact Heart: irregularly irregular rhythm Lungs: Breath sounds are clear Extremities: There is an AV fistula in the left forearm with an appropriate thrill. No peripheral edema. Has mild venous stasis changes in both lower legs / ankles. All extremities  are well perfused.  Lab Results: Recent Labs    09/13/18 1904 09/14/18 0304  WBC 6.5 5.5  HGB 10.3* 9.9*  HCT 31.5* 31.0*  PLT 177 135*   BMET:  Recent Labs    09/13/18 1904  NA 139  K 4.4  CL 95*  CO2 30  GLUCOSE 106*  BUN 21  CREATININE 6.24*  CALCIUM 8.7*    PT/INR:  Recent Labs    09/13/18 1904  LABPROT 19.8*  INR 1.7*   ABG No results found for: PHART, HCO3, TCO2, ACIDBASEDEF, O2SAT CBG (last 3)  No results for input(s): GLUCAP in the last 72 hours.  Assessment/Plan: S/P Procedure(s) (LRB): REDO STERNOTOMY (N/A) CORONARY ARTERY BYPASS GRAFTING (CABG) (N/A) TRANSESOPHAGEAL ECHOCARDIOGRAM (TEE) (N/A)  -Symptomatic two-vessel CAD in 63yo male who is s/p mechanical aortic valve replacement for  aortic stenosis at [facility] in 2014. He was admitted 09/13/2018 for heparin bridging while Coumadin is held in preparation for CABG on Thursday  611/20 by Dr. Roxan Hockey. INR on admission 1.7. Will repeat in AM. Denies any angina since admission.  EF is 30-25%  -ESRD- secondary to IgA nephropathy and bilateral renal cell carcinoma. Initially managed with peritoneal dialysis but currently receives HD via left arm AV fistula M-W-F.  Last dialysis 09/13/18. Consult nephrology to arrange for dialysis tomorrow and post-operatively.   -Chronic atrial fibrillation- rate controlled, on amiodarone 200mg  BID.   -GERD- PPI resumed  -Obstructive sleep apnea- using CPAP at night.  -Anemia of chronic disease-Hct currently 31, plt 135,000.    LOS: 1 day    Antony Odea , PA-C [phone] 09/14/2018 I have seen and examined Mr. Reppond For redo sternotomy for CABG on Thursday 6/11 For HD tomorrow On heparin for mechanical valve  Remo Lipps C. Roxan Hockey, MD [facility] [phone]

## 2018-09-14 NOTE — Progress Notes (Signed)
Unable to collect UA per pre-op orders due to patient being anuric. Pt is on HD MWF.

## 2018-09-14 NOTE — Progress Notes (Signed)
    Subjective:  Denies SSCP, palpitations or Dyspnea   Objective:  Vitals:   09/14/18 0620 09/14/18 0700 09/14/18 0802 09/14/18 0803  BP: 132/68   140/83  Pulse: 80 72 87 70  Resp: 14 18 13 19   Temp: 97.7 F (36.5 C)   98.1 F (36.7 C)  TempSrc: Oral   Oral  SpO2: 100% 95% 99% 99%  Weight:      Height:        Intake/Output from previous day:  Intake/Output Summary (Last 24 hours) at 09/14/2018 0818 Last data filed at 09/14/2018 0300 Gross per 24 hour  Intake 61.74 ml  Output -  Net 61.74 ml    Physical Exam: Affect appropriate Healthy:  appears stated age HEENT: normal Neck supple with no adenopathy JVP normal no bruits no thyromegaly Lungs clear with no wheezing and good diaphragmatic motion Heart:  E1/E0 click SEM no AR   murmur, no rub, gallop or click PMI normal post sternotomy  Abdomen: benighn, BS positve, no tenderness, no AAA no bruit.  No HSM or HJR Distal pulses intact with no bruits No edema Neuro non-focal Skin warm and dry No muscular weakness   Lab Results: Basic Metabolic Panel: Recent Labs    09/13/18 1904  NA 139  K 4.4  CL 95*  CO2 30  GLUCOSE 106*  BUN 21  CREATININE 6.24*  CALCIUM 8.7*   Liver Function Tests: Recent Labs    09/13/18 1904  AST 36  ALT 28  ALKPHOS 200*  BILITOT 0.6  PROT 7.3  ALBUMIN 3.7   No results for input(s): LIPASE, AMYLASE in the last 72 hours. CBC: Recent Labs    09/13/18 1904 09/14/18 0304  WBC 6.5 5.5  NEUTROABS 3.9  --   HGB 10.3* 9.9*  HCT 31.5* 31.0*  MCV 109.0* 109.5*  PLT 177 135*    Imaging: No results found.  Cardiac Studies:  ECG:  afib RBBB    Telemetry: afib good rate control  Echo:   Medications:   . allopurinol  300 mg Oral Daily  . amiodarone  200 mg Oral BID  . aspirin EC  81 mg Oral Daily  . cinacalcet  30 mg Oral Q supper  . FLUoxetine  10 mg Oral Daily  . [START ON 09/15/2018] midodrine  10 mg Oral Q M,W,F  . multivitamin  1 tablet Oral QHS  .  pantoprazole  40 mg Oral Daily  . pravastatin  40 mg Oral q1800  . predniSONE  5 mg Oral Q breakfast  . sevelamer carbonate  1,600 mg Oral TID WC     . heparin 1,250 Units/hr (09/14/18 0350)    Assessment/Plan:   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 Previous sternotomy for AVR for CABG 6/11 with [facility] has been postponed due to COVID Afib:  Continue amiodarone coumadin held with heparin bridging INR 1.7 Dialysis : good fistula in RUE with thrill had dialysis yesterday  HLD:  Continue statin  AVR:  21 mm ST Jude mechanical valve normal function 08/12/18   Jeremy [name] 09/14/2018, 8:18 AM

## 2018-09-14 NOTE — Progress Notes (Signed)
[place] for Heparin Indication:  Bridging for AVR/Afib prior to CABG   Allergies  Allergen Reactions  . Statins     Other reaction(s): Arthralgias (intolerance), Myalgias (intolerance)  Pt taking pravastatin   . Sertraline Rash    Patient Measurements: Height: 5\' 11"  (180.3 cm) Weight: 186 lb 8.2 oz (84.6 kg) IBW/kg (Calculated) : 75.3 Heparin Dosing Weight: 84.6 kg  Vital Signs: Temp: 98.2 F (36.8 C) (06/08 1841) Temp Source: Oral (06/08 1841) BP: 108/65 (06/08 1841) Pulse Rate: 104 (06/08 1841)  Labs: Recent Labs    09/13/18 1904 09/14/18 0304  HGB 10.3* 9.9*  HCT 31.5* 31.0*  PLT 177 135*  LABPROT 19.8*  --   INR 1.7*  --   HEPARINUNFRC  --  0.21*  CREATININE 6.24*  --     Estimated Creatinine Clearance: 13.2 mL/min (A) (by C-G formula based on SCr of 6.24 mg/dL (H)).   Medical History: Past Medical History:  Diagnosis Date  . 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 Aortic stenosis    a. 2014 s/p [facility]ch [facility]R ([facility])-->chronic coumadin; b. 08/2018 Echo: Mild AS/AI. Nl fxn/gradient.  . Arthritis   . CAD (coronary artery disease)    a. 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 02/2018 MV [facility]): large inf, infsept, inflat infarct w/ min rev, EF 30%; c. 06/2018 Cath: LM min irregs, LAD 60p, 95/30/31m, LCX min irregs, RCA 40p, 68m, 90d, RPAV 90, RPDA 95ost-->pending CABG (delayed 2/2 Covid).  . Chronic anticoagulation 06/2012   a. Coumadin in the setting of mech AVR and afib.  . Dialysis patient [facility]) 01 01 2008  . ESRD (end stage renal disease) ([place])    a. 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 1999 s/p Kidney transplant; c. 2014 Bilat nephrectomy (transplanted kidney resected) in the setting of renal cell carcinoma; d. PD until 11/2017-->HD.  Marland Kitchen HFrEF (heart failure with reduced ejection fraction) ([place])    a. 07/2016 Echo: EF 55-60%; b. 02/2018 MV: EF 30%; c.  04/2018 Echo: EF 35%; d. 08/2018 Echo: EF 30-35%. Glob HK w/o rwma. Mildly reduced RV fxn. Mod to sev dil LA. Nl AoV fxn.  Marland Kitchen Hx of colonoscopy    2009 by Dr. Laural Golden. Normal except for small submucosal lipoma. No evidence of polyps or colitis.  . Hyperlipidemia   . Hypertension   . Hypogonadism in male 07/25/2015  . Ischemic cardiomyopathy    a. 07/2016 Echo: EF 55-60%; b. 02/2018 MV: EF 30%; c. 04/2018 Echo: EF 35%; d. 08/2018 Echo: EF 30-35%.  . OSA (obstructive sleep apnea)    No CPAP  . Permanent atrial fibrillation    a. CHA2DS2VASc = 3-->Coumadin (also mech AVR); b. 06/2018 Amio started 2/2 elevated rates.  . Renal cell carcinoma ([place]) 08/04/2016    Assessment: 63 yr old male with ESRD on warfarin for AVR, admitted for CABG. Pharmacy is consulted to dose/monitor bridging heparin therapy.  Home warfarin regimen: per med rec, pt takes warfarin 1 mg daily, except on Sunday, on which he takes 2 mg. INR on admission 6/8 is 1.7. Per pt, last dose of warfarin was last Friday.  6/9 AM update: initial heparin level is sub-therapeutic, no issues per RN.   Goal of Therapy:  Heparin level 0.3-0.7 units/ml Monitor platelets by anticoagulation protocol: Yes   Plan:  Inc heparin to 1250 units/hr Re-check heparin level in 8 hours Daily  heparin level, CBC  Narda Bonds, PharmD, [place] Pharmacist Phone: [phone]

## 2018-09-14 NOTE — Progress Notes (Signed)
[place] for Heparin Indication:  Bridging for AVR/Afib prior to CABG   Allergies  Allergen Reactions  . Statins     Other reaction(s): Arthralgias (intolerance), Myalgias (intolerance)  Pt taking pravastatin   . Sertraline Rash    Patient Measurements: Height: 5\' 11"  (180.3 cm) Weight: 186 lb 8.2 oz (84.6 kg) IBW/kg (Calculated) : 75.3 Heparin Dosing Weight: 84.6 kg  Vital Signs: Temp: 98.2 F (36.8 C) (06/09 1125) Temp Source: Oral (06/09 1125) BP: 121/66 (06/09 1125) Pulse Rate: 85 (06/09 1126)  Labs: Recent Labs    09/13/18 1904 09/14/18 0304 09/14/18 1158  HGB 10.3* 9.9*  --   HCT 31.5* 31.0*  --   PLT 177 135*  --   LABPROT 19.8*  --   --   INR 1.7*  --   --   HEPARINUNFRC  --  0.21* 0.38  CREATININE 6.24*  --   --     Estimated Creatinine Clearance: 13.2 mL/min (A) (by C-G formula based on SCr of 6.24 mg/dL (H)).   Medical History: Past Medical History:  Diagnosis Date  . 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 Aortic stenosis    a. 2014 s/p mech AVR ([facility])-->chronic coumadin; b. 08/2018 Echo: Mild AS/AI. Nl fxn/gradient.  . Arthritis   . CAD (coronary artery disease)    a. 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 02/2018 MV [facility]): large inf, infsept, inflat infarct w/ min rev, EF 30%; c. 06/2018 Cath: LM min irregs, LAD 60p, 95/30/73m, LCX min irregs, RCA 40p, 41m, 90d, RPAV 90, RPDA 95ost-->pending CABG (delayed 2/2 Covid).  . Chronic anticoagulation 06/2012   a. Coumadin in the setting of mech AVR and afib.  . Dialysis patient [facility]) 01 01 2008  . ESRD (end stage renal disease) ([place])    a. 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 1999 s/p Kidney transplant; c. 2014 Bilat nephrectomy (transplanted kidney resected) in the setting of renal cell carcinoma; d. PD until 11/2017-->HD.  Marland Kitchen HFrEF (heart failure with reduced ejection fraction) ([place])    a.  07/2016 Echo: EF 55-60%; b. 02/2018 MV: EF 30%; c. 04/2018 Echo: EF 35%; d. 08/2018 Echo: EF 30-35%. Glob HK w/o rwma. Mildly reduced RV fxn. Mod to sev dil LA. Nl AoV fxn.  Marland Kitchen Hx of colonoscopy    2009 by Dr. Laural Golden. Normal except for small submucosal lipoma. No evidence of polyps or colitis.  . Hyperlipidemia   . Hypertension   . Hypogonadism in male 07/25/2015  . Ischemic cardiomyopathy    a. 07/2016 Echo: EF 55-60%; b. 02/2018 MV: EF 30%; c. 04/2018 Echo: EF 35%; d. 08/2018 Echo: EF 30-35%.  . OSA (obstructive sleep apnea)    No CPAP  . Permanent atrial fibrillation    a. CHA2DS2VASc = 3-->Coumadin (also mech AVR); b. 06/2018 Amio started 2/2 elevated rates.  . Renal cell carcinoma ([place]) 08/04/2016    Assessment: 63 yoM with severe CAD admitted for planned CABG. Pt on warfarin PTA for hx mAVR and PAF, held with INR 1.7 on admit and started on heparin bridge prior to surgery. Repeat heparin level therapeutic this afternoon.   Goal of Therapy:  Heparin level 0.3-0.7 units/ml Monitor platelets by anticoagulation protocol: Yes   Plan:  -Continue heparin 1250 units/hr -Daily heparin level and CBC   Arrie Senate, PharmD, BCPS Clinical Pharmacist [phone] Please check AMION for all [facility]  Pharmacy numbers 09/14/2018

## 2018-09-15 ENCOUNTER — Inpatient Hospital Stay (HOSPITAL_COMMUNITY): Payer: Medicare Other

## 2018-09-15 DIAGNOSIS — Z01810 Encounter for preprocedural cardiovascular examination: Secondary | ICD-10-CM

## 2018-09-15 LAB — CBC
HCT: 30.7 % — ABNORMAL LOW (ref 39.0–52.0)
Hemoglobin: 10 g/dL — ABNORMAL LOW (ref 13.0–17.0)
MCH: 35.6 pg — ABNORMAL HIGH (ref 26.0–34.0)
MCHC: 32.6 g/dL (ref 30.0–36.0)
MCV: 109.3 fL — ABNORMAL HIGH (ref 80.0–100.0)
Platelets: 130 10*3/uL — ABNORMAL LOW (ref 150–400)
RBC: 2.81 MIL/uL — ABNORMAL LOW (ref 4.22–5.81)
RDW: 14.1 % (ref 11.5–15.5)
WBC: 5.9 10*3/uL (ref 4.0–10.5)
nRBC: 0 % (ref 0.0–0.2)

## 2018-09-15 LAB — HEPARIN LEVEL (UNFRACTIONATED): Heparin Unfractionated: 0.32 IU/mL (ref 0.30–0.70)

## 2018-09-15 LAB — HEMOGLOBIN A1C
Hgb A1c MFr Bld: 5.9 % — ABNORMAL HIGH (ref 4.8–5.6)
Mean Plasma Glucose: 122.63 mg/dL

## 2018-09-15 LAB — BASIC METABOLIC PANEL
Anion gap: 15 (ref 5–15)
BUN: 49 mg/dL — ABNORMAL HIGH (ref 8–23)
CO2: 28 mmol/L (ref 22–32)
Calcium: 8.7 mg/dL — ABNORMAL LOW (ref 8.9–10.3)
Chloride: 94 mmol/L — ABNORMAL LOW (ref 98–111)
Creatinine, Ser: 9.41 mg/dL — ABNORMAL HIGH (ref 0.61–1.24)
GFR calc Af Amer: 6 mL/min — ABNORMAL LOW (ref >60)
GFR calc non Af Amer: 5 mL/min — ABNORMAL LOW (ref >60)
Glucose, Bld: 96 mg/dL (ref 70–99)
Potassium: 5 mmol/L (ref 3.5–5.1)
Sodium: 137 mmol/L (ref 135–145)

## 2018-09-15 LAB — PROTIME-INR
INR: 1.3 — ABNORMAL HIGH (ref 0.8–1.2)
Prothrombin Time: 15.8 seconds — ABNORMAL HIGH (ref 11.4–15.2)

## 2018-09-15 LAB — ABO/RH: ABO/RH(D): O POS

## 2018-09-15 LAB — GLUCOSE, CAPILLARY: Glucose-Capillary: 72 mg/dL (ref 70–99)

## 2018-09-15 MED ORDER — MAGNESIUM SULFATE 50 % IJ SOLN
40.0000 meq | INTRAMUSCULAR | Status: DC
  Filled 2018-09-15: qty 9.85

## 2018-09-15 MED ORDER — DIAZEPAM 5 MG PO TABS
5.0000 mg | ORAL_TABLET | Freq: Once | ORAL | Status: AC
  Administered 2018-09-16: 06:00:00 5 mg via ORAL
  Filled 2018-09-15: qty 1

## 2018-09-15 MED ORDER — INSULIN REGULAR(HUMAN) IN NACL 100-0.9 UT/100ML-% IV SOLN
INTRAVENOUS | Status: DC
  Filled 2018-09-15: qty 100

## 2018-09-15 MED ORDER — POTASSIUM CHLORIDE 2 MEQ/ML IV SOLN
80.0000 meq | INTRAVENOUS | Status: DC
  Filled 2018-09-15 (×2): qty 40

## 2018-09-15 MED ORDER — CHLORHEXIDINE GLUCONATE CLOTH 2 % EX PADS: 6.0000 | MEDICATED_PAD | Freq: Once | CUTANEOUS | Status: AC

## 2018-09-15 MED ORDER — PLASMA-LYTE 148 IV SOLN
INTRAVENOUS | Status: AC
  Administered 2018-09-16: 500 mL
  Filled 2018-09-15 (×2): qty 2.5

## 2018-09-15 MED ORDER — TEMAZEPAM 15 MG PO CAPS
15.0000 mg | ORAL_CAPSULE | Freq: Once | ORAL | Status: AC | PRN
  Administered 2018-09-15: 15 mg via ORAL
  Filled 2018-09-15: qty 1

## 2018-09-15 MED ORDER — VANCOMYCIN HCL 10 G IV SOLR: 1250.0000 mg | INTRAVENOUS | Status: DC

## 2018-09-15 MED ORDER — VANCOMYCIN HCL 10 G IV SOLR
1500.0000 mg | INTRAVENOUS | Status: AC
  Administered 2018-09-16: 1500 mg via INTRAVENOUS
  Filled 2018-09-15: qty 1500

## 2018-09-15 MED ORDER — MUPIROCIN 2 % EX OINT
1.0000 "application " | TOPICAL_OINTMENT | Freq: Two times a day (BID) | CUTANEOUS | Status: AC
  Administered 2018-09-15 – 2018-09-19 (×8): 1 via NASAL
  Filled 2018-09-15 (×4): qty 22

## 2018-09-15 MED ORDER — CHLORHEXIDINE GLUCONATE CLOTH 2 % EX PADS
6.0000 | MEDICATED_PAD | Freq: Every day | CUTANEOUS | Status: DC
  Administered 2018-09-15: 6 via TOPICAL

## 2018-09-15 MED ORDER — MILRINONE LACTATE IN DEXTROSE 20-5 MG/100ML-% IV SOLN
0.3000 ug/kg/min | INTRAVENOUS | Status: DC
  Filled 2018-09-15: qty 100

## 2018-09-15 MED ORDER — CHLORHEXIDINE GLUCONATE CLOTH 2 % EX PADS
6.0000 | MEDICATED_PAD | Freq: Once | CUTANEOUS | Status: AC
  Administered 2018-09-15: 6 via TOPICAL

## 2018-09-15 MED ORDER — TRANEXAMIC ACID (OHS) BOLUS VIA INFUSION
15.0000 mg/kg | INTRAVENOUS | Status: AC
  Administered 2018-09-16: 1276.5 mg via INTRAVENOUS
  Filled 2018-09-15: qty 1277

## 2018-09-15 MED ORDER — NITROGLYCERIN IN D5W 200-5 MCG/ML-% IV SOLN
2.0000 ug/min | INTRAVENOUS | Status: AC
  Administered 2018-09-16: 15 ug/min via INTRAVENOUS
  Filled 2018-09-15: qty 250

## 2018-09-15 MED ORDER — MIDODRINE HCL 5 MG PO TABS: 10.0000 mg | ORAL_TABLET | ORAL | Status: DC

## 2018-09-15 MED ORDER — METOPROLOL TARTRATE 12.5 MG HALF TABLET
12.5000 mg | ORAL_TABLET | Freq: Once | ORAL | Status: AC
  Administered 2018-09-16: 12.5 mg via ORAL
  Filled 2018-09-15: qty 1

## 2018-09-15 MED ORDER — BISACODYL 5 MG PO TBEC
5.0000 mg | DELAYED_RELEASE_TABLET | Freq: Once | ORAL | Status: AC
  Administered 2018-09-15: 5 mg via ORAL
  Filled 2018-09-15: qty 1

## 2018-09-15 MED ORDER — DOPAMINE-DEXTROSE 3.2-5 MG/ML-% IV SOLN
0.0000 ug/kg/min | INTRAVENOUS | Status: AC
  Administered 2018-09-16: 3 ug/kg/min via INTRAVENOUS
  Filled 2018-09-15: qty 250

## 2018-09-15 MED ORDER — SODIUM CHLORIDE 0.9 % IV SOLN
INTRAVENOUS | Status: DC
  Filled 2018-09-15 (×2): qty 30

## 2018-09-15 MED ORDER — SODIUM CHLORIDE 0.9 % IV SOLN
1.5000 g | INTRAVENOUS | Status: AC
  Administered 2018-09-16: .75 g via INTRAVENOUS
  Administered 2018-09-16: 1.5 g via INTRAVENOUS
  Filled 2018-09-15: qty 1.5

## 2018-09-15 MED ORDER — MIDODRINE HCL 5 MG PO TABS
ORAL_TABLET | ORAL | Status: AC
  Administered 2018-09-15: 10 mg via ORAL
  Filled 2018-09-15: qty 2

## 2018-09-15 MED ORDER — SODIUM CHLORIDE 0.9 % IV SOLN
750.0000 mg | INTRAVENOUS | Status: DC
  Filled 2018-09-15: qty 750

## 2018-09-15 MED ORDER — PHENYLEPHRINE HCL-NACL 20-0.9 MG/250ML-% IV SOLN
30.0000 ug/min | INTRAVENOUS | Status: DC
  Filled 2018-09-15: qty 250

## 2018-09-15 MED ORDER — TRANEXAMIC ACID (OHS) PUMP PRIME SOLUTION
2.0000 mg/kg | INTRAVENOUS | Status: DC
  Filled 2018-09-15 (×2): qty 1.7

## 2018-09-15 MED ORDER — CHLORHEXIDINE GLUCONATE 0.12 % MT SOLN
15.0000 mL | Freq: Once | OROMUCOSAL | Status: AC
  Administered 2018-09-16: 06:00:00 15 mL via OROMUCOSAL
  Filled 2018-09-15: qty 15

## 2018-09-15 MED ORDER — TRANEXAMIC ACID 1000 MG/10ML IV SOLN
1.5000 mg/kg/h | INTRAVENOUS | Status: DC
  Filled 2018-09-15 (×2): qty 25

## 2018-09-15 MED ORDER — EPINEPHRINE PF 1 MG/ML IJ SOLN
0.0000 ug/min | INTRAVENOUS | Status: DC
  Filled 2018-09-15 (×2): qty 4

## 2018-09-15 MED ORDER — DEXMEDETOMIDINE HCL IN NACL 400 MCG/100ML IV SOLN
0.1000 ug/kg/h | INTRAVENOUS | Status: DC
  Filled 2018-09-15: qty 100

## 2018-09-15 NOTE — Progress Notes (Addendum)
Procedure(s) (LRB): REDO STERNOTOMY (N/A) CORONARY ARTERY BYPASS GRAFTING (CABG) (N/A) TRANSESOPHAGEAL ECHOCARDIOGRAM (TEE) (N/A) Subjective: Jeremy [name] was seen during hemodialysis this AM. He said he feels well. Denies chest pain or shortness of breath. He had no further questions related to the planned surgery tomorrow.  Objective: Vital signs in last 24 hours: Temp:  [97.9 F (36.6 C)-98.4 F (36.9 C)] 98.2 F (36.8 C) (06/10 0719) Pulse Rate:  [69-94] 86 (06/10 0930) Cardiac Rhythm: Atrial fibrillation (06/10 0719) Resp:  [11-19] 12 (06/10 0719) BP: (109-157)/(52-97) 110/55 (06/10 0930) SpO2:  [97 %-100 %] 100 % (06/10 0719) Weight:  [84.5 kg-85.1 kg] 84.5 kg (06/10 0719)    Intake/Output from previous day: 06/09 0701 - 06/10 0700 In: 720 [P.O.:720] Out: 2 [Stool:2] Intake/Output this shift: No intake/output data recorded.  General appearance: alert, cooperative and no distress Neurologic: intact Heart: irregularly irregular rhythm Lungs: Breath sounds are clear  Lab Results: Recent Labs    09/14/18 0304 09/15/18 0555  WBC 5.5 5.9  HGB 9.9* 10.0*  HCT 31.0* 30.7*  PLT 135* 130*   BMET:  Recent Labs    09/13/18 1904 09/15/18 0555  NA 139 137  K 4.4 5.0  CL 95* 94*  CO2 30 28  GLUCOSE 106* 96  BUN 21 49*  CREATININE 6.24* 9.41*  CALCIUM 8.7* 8.7*    PT/INR:  Recent Labs    09/15/18 0555  LABPROT 15.8*  INR 1.3*   ABG    Component Value Date/Time   PHART 7.442 09/14/2018 2150   HCO3 30.1 (H) 09/14/2018 2150   O2SAT 95.5 09/14/2018 2150   CBG (last 3)  No results for input(s): GLUCAP in the last 72 hours.  Assessment/Plan: S/P Procedure(s) (LRB): REDO STERNOTOMY (N/A) CORONARY ARTERY BYPASS GRAFTING (CABG) (N/A) TRANSESOPHAGEAL ECHOCARDIOGRAM (TEE) (N/A)  -Symptomatic two-vessel CAD in 63yo male who is s/p mechanical aortic valve replacement for aortic stenosis at [facility] in 2014. He was admitted 09/13/2018  for heparin bridging while Coumadin is held in preparation for CABG tomorrow by  Dr. Roxan Hockey. INR 1.3 this AM .  Denies any angina since admission. He agrees with proceeding with planned re-do sternotomy and CABG in am. He had no further questions.   -ESRD- receiving HD now. Appreciate evaluation and management by nephrology   -Chronic atrial fibrillation- rate controlled, on amiodarone 200mg  BID.   -GERD- PPI resumed  -Obstructive sleep apnea- using CPAP at night.  -Anemia of chronic disease-Hct currently 31, plt 135,000.   LOS: 2 days    Antony Odea, PA-C [phone] 09/15/2018  I have seen and examined Jeremy [name] and agree with the findings noted above For redo sternotomy for CABG tomorrow AM All questions answered  Revonda Standard. Roxan Hockey, MD [facility] [phone]

## 2018-09-15 NOTE — Progress Notes (Signed)
Jeremy [name] for Heparin Indication:  Bridging for AVR/Afib prior to CABG   Allergies  Allergen Reactions  . Statins     Other reaction(s): Arthralgias (intolerance), Myalgias (intolerance)  Pt taking pravastatin   . Sertraline Rash    Patient Measurements: Height: 5\' 11"  (180.3 cm) Weight: 187 lb 9.6 oz (85.1 kg)(scale B) IBW/kg (Calculated) : 75.3 Heparin Dosing Weight: 84.6 kg  Vital Signs: Temp: 97.9 F (36.6 C) (06/10 0500) Temp Source: Oral (06/10 0500) BP: 109/52 (06/10 0500) Pulse Rate: 79 (06/10 0500)  Labs: Recent Labs    09/13/18 1904 09/14/18 0304 09/14/18 1158 09/15/18 0555  HGB 10.3* 9.9*  --  10.0*  HCT 31.5* 31.0*  --  30.7*  PLT 177 135*  --  130*  LABPROT 19.8*  --   --  15.8*  INR 1.7*  --   --  1.3*  HEPARINUNFRC  --  0.21* 0.38 0.32  CREATININE 6.24*  --   --  9.41*    Estimated Creatinine Clearance: 8.8 mL/min (A) (by C-G formula based on SCr of 9.41 mg/dL (H)).   Medical History: Past Medical History:  Diagnosis Date  . 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 Aortic stenosis    a. 2014 s/p mech AVR ([facility])-->chronic coumadin; b. 08/2018 Echo: Mild AS/AI. Nl fxn/gradient.  . Arthritis   . CAD (coronary artery disease)    a. 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 02/2018 [facility]): large inf, infsept, inflat infarct w/ min rev, EF 30%; c. 06/2018 Cath: LM min irregs, LAD 60p, 95/30/90m, LCX min irregs, RCA 40p, 45m, 90d, RPAV 90, RPDA 95ost-->pending CABG (delayed 2/2 Covid).  . Chronic anticoagulation 06/2012   a. Coumadin in the setting of mech AVR and afib.  . Dialysis patient [facility]) 04/07/2006   Patient started HD around 1996,  had 1st transplant LLQ around 1998 until 2007 when they found renal cell cancer in transplant and both native kidneys, all were removed > went back on HD until 2nd transplant RLQ in 2014 which lasted 3 yrs; it was  embolized for suspected acute rejection. Is currently on HD MWF in [place] w/ [facility] nephrology.  . ESRD (end stage renal disease) ([place])    a. 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 1999 s/p Kidney transplant; c. 2014 Bilat nephrectomy (transplanted kidney resected) in the setting of renal cell carcinoma; d. PD until 11/2017-->HD.  Marland Kitchen HFrEF (heart failure with reduced ejection fraction) ([place])    a. 07/2016 Echo: EF 55-60%; b. 02/2018 MV: EF 30%; c. 04/2018 Echo: EF 35%; d. 08/2018 Echo: EF 30-35%. Glob HK w/o rwma. Mildly reduced RV fxn. Mod to sev dil LA. Nl AoV fxn.  Marland Kitchen Hx of colonoscopy    2009 by Dr. Laural Golden. Normal except for small submucosal lipoma. No evidence of polyps or colitis.  . Hyperlipidemia   . Hypertension   . Hypogonadism in male 07/25/2015  . Ischemic cardiomyopathy    a. 07/2016 Echo: EF 55-60%; b. 02/2018 MV: EF 30%; c. 04/2018 Echo: EF 35%; d. 08/2018 Echo: EF 30-35%.  . OSA (obstructive sleep apnea)    No CPAP  . Permanent atrial fibrillation    a. CHA2DS2VASc = 3-->Coumadin (also mech AVR); b. 06/2018 Amio started 2/2 elevated rates.  . Renal cell carcinoma ([place]) 08/04/2016    Assessment: 63 yoM with severe CAD admitted for planned CABG. Pt  on warfarin PTA for hx mAVR and PAF, held with INR 1.7 on admit and started on heparin bridge prior to surgery.   Heparin level remains therapeutic, CBC stable, CABG planned for tomorrow.   Goal of Therapy:  Heparin level 0.3-0.7 units/ml Monitor platelets by anticoagulation protocol: Yes   Plan:  -Continue heparin 1250 units/hr -Daily heparin level and CBC   Arrie Senate, PharmD, BCPS Clinical Pharmacist [phone] Please check AMION for all [facility] numbers 09/15/2018

## 2018-09-15 NOTE — Progress Notes (Signed)
[facility] Progress Note  Subjective: no c/o, on hd now  Vitals:   09/15/18 1000 09/15/18 1030 09/15/18 1125 09/15/18 1145  BP: (!) 110/58 (!) 109/57 (!) 108/53 125/77  Pulse: 73 76 74 64  Resp:    18  Temp:   98.5 F (36.9 C) 97.7 F (36.5 C)  TempSrc:   Oral Oral  SpO2:   100% 100%  Weight:   84.3 kg   Height:        Inpatient medications: . allopurinol  300 mg Oral Daily  . amiodarone  200 mg Oral BID  . aspirin EC  81 mg Oral Daily  . bisacodyl  5 mg Oral Once  . [START ON 09/16/2018] chlorhexidine  15 mL Mouth/Throat Once  . Chlorhexidine Gluconate Cloth  6 each Topical Q0600  . Chlorhexidine Gluconate Cloth  6 each Topical Once   And  . [START ON 09/16/2018] Chlorhexidine Gluconate Cloth  6 each Topical Once  . Chlorhexidine Gluconate Cloth  6 each Topical Daily  . cinacalcet  30 mg Oral Q supper  . [START ON 09/16/2018] diazepam  5 mg Oral Once  . FLUoxetine  10 mg Oral Daily  . [START ON 09/16/2018] heparin-papaverine-plasmalyte irrigation   Irrigation To OR  . [START ON 09/16/2018] insulin   Intravenous To OR  . [START ON 09/16/2018] magnesium sulfate  40 mEq Other To OR  . [START ON 09/16/2018] metoprolol tartrate  12.5 mg Oral Once  . midodrine  10 mg Oral Q M,W,F  . multivitamin  1 tablet Oral QHS  . mupirocin ointment  1 application Nasal BID  . pantoprazole  40 mg Oral Daily  . [START ON 09/16/2018] phenylephrine  30-200 mcg/min Intravenous To OR  . [START ON 09/16/2018] potassium chloride  80 mEq Other To OR  . pravastatin  40 mg Oral q1800  . predniSONE  5 mg Oral Q breakfast  . sevelamer carbonate  1,600 mg Oral TID WC  . [START ON 09/16/2018] tranexamic acid  15 mg/kg Intravenous To OR  . [START ON 09/16/2018] tranexamic acid  2 mg/kg Intracatheter To OR   . [START ON 09/16/2018] cefUROXime (ZINACEF)  IV    . [START ON 09/16/2018] cefUROXime (ZINACEF)  IV    . [START ON 09/16/2018] dexmedetomidine    . [START ON 09/16/2018] DOPamine    . [START  ON 09/16/2018] epinephrine    . [START ON 09/16/2018] heparin 30,000 units/NS 1000 mL solution for CELLSAVER    . heparin 1,250 Units/hr (09/14/18 1742)  . [START ON 09/16/2018] milrinone    . [START ON 09/16/2018] nitroGLYCERIN    . [START ON 09/16/2018] tranexamic acid (CYKLOKAPRON) infusion ([facility])    . [START ON 09/16/2018] vancomycin     acetaminophen, nitroGLYCERIN, ondansetron (ZOFRAN) IV, temazepam    Exam: Gen alert, no distress, calm No jvd or bruits Chest clear bilat to bases RRR no MRG Abd soft ntnd no mass or ascites +bs Ext no LE edema Neuro is alert, Ox 3 , nf LFA AVF+bruit    Home meds:  - amiodarone 200 bid/ midodrine 10-20 mg HD mwf/ sl ntg prn/ warfarin as directed/ pravastatin 10 qd  - allopurinol 300 qd/ omeprazole 20  - prednisone 5 qd (arthritis)  - sevelamer carb 2 ac tid/ cinacalcet 30 qd  - fluoxetine 10 qd  - prn's/ vitamins/ supplements     OP Dialysis: [facility] MWF ([place]/ [facility])  3h 26min   450/800  84.5kg   Heparin none  R AVF     Assessment: 1. CAD h/o stent - admitted for elective CABG sched for 6/11 2. ESRD on HD - MWF. HD today 3. Volume no excess on exam, at dry 4. Hypotension - chronic issue on midodrine pre/ during HD  5. Atrial fib - on warfarin and amiodarone 6. HL 7. MCD ckd - cont binder/ sensipar      [facility] 09/15/2018, 12:11 PM  Iron/TIBC/Ferritin/ %Sat    Component Value Date/Time   IRON 86 04/08/2017 1210   TIBC 234 (L) 04/08/2017 1210   FERRITIN 1,239 (H) 04/08/2017 1210   IRONPCTSAT 37 04/08/2017 1210   Recent Labs  Lab 09/13/18 1904 09/15/18 0555  NA 139 137  K 4.4 5.0  CL 95* 94*  CO2 30 28  GLUCOSE 106* 96  BUN 21 49*  CREATININE 6.24* 9.41*  CALCIUM 8.7* 8.7*  ALBUMIN 3.7  --   INR 1.7* 1.3*   Recent Labs  Lab 09/13/18 1904  AST 36  ALT 28  ALKPHOS 200*  BILITOT 0.6  PROT 7.3   Recent Labs  Lab 09/15/18 0555  WBC 5.9  HGB 10.0*  HCT 30.7*   PLT 130*

## 2018-09-15 NOTE — Progress Notes (Signed)
Pre-CABG Dopplers completed. Refer to "CV Proc" under chart review to view preliminary results.  09/15/2018 5:58 PM Maudry Mayhew, MHA, RVT, RDCS, RDMS

## 2018-09-15 NOTE — Progress Notes (Signed)
Pt was willing to walk after HD (BP 135/67) however his food tray came. Sts he will walk later, as he did yesterday. Discussed sternal precautions, IS (2500 mL), mobility post op, and d/c planning. His girlfriend will be with him at d/c. He voices that he has trouble with his left knee; it is not strong with standing. We discussed different methods to stand adhering to sternal precautions. Good reception. Gave pt Scientist, clinical (histocompatibility and immunogenetics).  [place], ACSM 1:26 PM 09/15/2018

## 2018-09-15 NOTE — Progress Notes (Signed)
Subjective:  Denies SSCP, palpitations or Dyspnea getting dialysis    Objective:  Vitals:   09/15/18 0500 09/15/18 0719 09/15/18 0731 09/15/18 0800  BP: (!) 109/52 123/73 136/68 (!) 157/78  Pulse: 79 78 73 94  Resp: 19 12    Temp: 97.9 F (36.6 C) 98.2 F (36.8 C)    TempSrc: Oral Oral    SpO2: 100% 100%    Weight: 85.1 kg 84.5 kg    Height:        Intake/Output from previous day:  Intake/Output Summary (Last 24 hours) at 09/15/2018 0843 Last data filed at 09/14/2018 1300 Gross per 24 hour  Intake 360 ml  Output 2 ml  Net 358 ml    Physical Exam: Affect appropriate Healthy:  appears stated age HEENT: normal Neck supple with no adenopathy JVP normal no bruits no thyromegaly Lungs clear with no wheezing and good diaphragmatic motion Heart:  C1/K4 click SEM no AR   murmur, no rub, gallop or click PMI normal post sternotomy  Abdomen: benighn, BS positve, no tenderness, no AAA no bruit.  No HSM or HJR Distal pulses intact with no bruits No edema Neuro non-focal Skin warm and dry No muscular weakness   Lab Results: Basic Metabolic Panel: Recent Labs    09/13/18 1904 09/15/18 0555  NA 139 137  K 4.4 5.0  CL 95* 94*  CO2 30 28  GLUCOSE 106* 96  BUN 21 49*  CREATININE 6.24* 9.41*  CALCIUM 8.7* 8.7*   Liver Function Tests: Recent Labs    09/13/18 1904  AST 36  ALT 28  ALKPHOS 200*  BILITOT 0.6  PROT 7.3  ALBUMIN 3.7   No results for input(s): LIPASE, AMYLASE in the last 72 hours. CBC: Recent Labs    09/13/18 1904 09/14/18 0304 09/15/18 0555  WBC 6.5 5.5 5.9  NEUTROABS 3.9  --   --   HGB 10.3* 9.9* 10.0*  HCT 31.5* 31.0* 30.7*  MCV 109.0* 109.5* 109.3*  PLT 177 135* 130*    Imaging: Dg Chest 2 View  Result Date: 09/14/2018 CLINICAL DATA:  Preop CABG history of aortic valve EXAM: CHEST - 2 VIEW COMPARISON:  07/17/2016 FINDINGS: Post sternotomy changes and valve replacement. No acute airspace disease or pleural effusion. Mild  cardiomegaly without interval change. Aortic atherosclerosis. No pneumothorax. IMPRESSION: No active cardiopulmonary disease.  Mild cardiomegaly. Electronically Signed   By: Donavan Foil M.D.   On: 09/14/2018 21:41    Cardiac Studies:  ECG:  afib RBBB    Telemetry: afib good rate control  Echo:   Medications:   . allopurinol  300 mg Oral Daily  . amiodarone  200 mg Oral BID  . aspirin EC  81 mg Oral Daily  . bisacodyl  5 mg Oral Once  . [START ON 09/16/2018] chlorhexidine  15 mL Mouth/Throat Once  . Chlorhexidine Gluconate Cloth  6 each Topical Q0600  . Chlorhexidine Gluconate Cloth  6 each Topical Once   And  . [START ON 09/16/2018] Chlorhexidine Gluconate Cloth  6 each Topical Once  . Chlorhexidine Gluconate Cloth  6 each Topical Daily  . cinacalcet  30 mg Oral Q supper  . [START ON 09/16/2018] diazepam  5 mg Oral Once  . FLUoxetine  10 mg Oral Daily  . [START ON 09/16/2018] heparin-papaverine-plasmalyte irrigation   Irrigation To OR  . [START ON 09/16/2018] insulin   Intravenous To OR  . [START ON 09/16/2018] magnesium sulfate  40 mEq Other To OR  . [  START ON 09/16/2018] metoprolol tartrate  12.5 mg Oral Once  . midodrine  10 mg Oral Q M,W,F  . multivitamin  1 tablet Oral QHS  . mupirocin ointment  1 application Nasal BID  . pantoprazole  40 mg Oral Daily  . [START ON 09/16/2018] phenylephrine  30-200 mcg/min Intravenous To OR  . [START ON 09/16/2018] potassium chloride  80 mEq Other To OR  . pravastatin  40 mg Oral q1800  . predniSONE  5 mg Oral Q breakfast  . sevelamer carbonate  1,600 mg Oral TID WC  . [START ON 09/16/2018] tranexamic acid  15 mg/kg Intravenous To OR  . [START ON 09/16/2018] tranexamic acid  2 mg/kg Intracatheter To OR     . [START ON 09/16/2018] cefUROXime (ZINACEF)  IV    . [START ON 09/16/2018] dexmedetomidine    . [START ON 09/16/2018] DOPamine    . [START ON 09/16/2018] epinephrine    . [START ON 09/16/2018] heparin 30,000 units/NS 1000 mL solution for  CELLSAVER    . heparin 1,250 Units/hr (09/14/18 1742)  . [START ON 09/16/2018] milrinone    . [START ON 09/16/2018] nitroGLYCERIN    . [START ON 09/16/2018] tranexamic acid (CYKLOKAPRON) infusion ([facility])      Assessment/Plan:   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 Previous sternotomy for AVR for CABG 6/11 with [facility] has been postponed due to COVID  Afib:  Continue amiodarone coumadin held with heparin bridging INR 1.7 heparin level is RX  Dialysis : good fistula in RUE dialyzing now   HLD:  Continue statin   AVR:  21 mm ST Jude mechanical valve normal function 08/12/18   Jenkins Rouge 09/15/2018, 8:43 AM

## 2018-09-16 ENCOUNTER — Inpatient Hospital Stay (HOSPITAL_COMMUNITY): Payer: Medicare Other

## 2018-09-16 ENCOUNTER — Encounter (HOSPITAL_COMMUNITY)
Admission: RE | Disposition: A | Discharge status: Home / Self Care | Payer: Self-pay | Source: Home / Self Care | Attending: Thoracic Surgery (Cardiothoracic Vascular Surgery)

## 2018-09-16 ENCOUNTER — Inpatient Hospital Stay (HOSPITAL_COMMUNITY): Payer: Medicare Other | Admitting: Certified Registered Nurse Anesthetist

## 2018-09-16 ENCOUNTER — Encounter (HOSPITAL_COMMUNITY): Payer: Self-pay | Admitting: Anesthesiology

## 2018-09-16 DIAGNOSIS — I2510 Atherosclerotic heart disease of native coronary artery without angina pectoris: Secondary | ICD-10-CM

## 2018-09-16 DIAGNOSIS — Z951 Presence of aortocoronary bypass graft: Secondary | ICD-10-CM

## 2018-09-16 HISTORY — PX: TEE WITHOUT CARDIOVERSION: SHX5443

## 2018-09-16 HISTORY — PX: CORONARY ARTERY BYPASS GRAFT: SHX141

## 2018-09-16 LAB — POCT I-STAT 7, (LYTES, BLD GAS, ICA,H+H)
Acid-Base Excess: 1 mmol/L (ref 0.0–2.0)
Acid-base deficit: 1 mmol/L (ref 0.0–2.0)
Bicarbonate: 26.3 mmol/L (ref 20.0–28.0)
Bicarbonate: 25.7 mmol/L (ref 20.0–28.0)
Bicarbonate: 25.2 mmol/L (ref 20.0–28.0)
Calcium, Ion: 1.09 mmol/L — ABNORMAL LOW (ref 1.15–1.40)
Calcium, Ion: 1.1 mmol/L — ABNORMAL LOW (ref 1.15–1.40)
Calcium, Ion: 1.12 mmol/L — ABNORMAL LOW (ref 1.15–1.40)
HCT: 31 % — ABNORMAL LOW (ref 39.0–52.0)
HCT: 28 % — ABNORMAL LOW (ref 39.0–52.0)
HCT: 29 % — ABNORMAL LOW (ref 39.0–52.0)
Hemoglobin: 10.5 g/dL — ABNORMAL LOW (ref 13.0–17.0)
Hemoglobin: 9.5 g/dL — ABNORMAL LOW (ref 13.0–17.0)
Hemoglobin: 9.9 g/dL — ABNORMAL LOW (ref 13.0–17.0)
O2 Saturation: 98 %
O2 Saturation: 99 %
O2 Saturation: 99 %
Patient temperature: 36.4
Patient temperature: 37.1
Patient temperature: 37.4
Potassium: 5.5 mmol/L — ABNORMAL HIGH (ref 3.5–5.1)
Potassium: 7.2 mmol/L (ref 3.5–5.1)
Potassium: 6.9 mmol/L (ref 3.5–5.1)
Sodium: 137 mmol/L (ref 135–145)
Sodium: 134 mmol/L — ABNORMAL LOW (ref 135–145)
Sodium: 136 mmol/L (ref 135–145)
TCO2: 28 mmol/L (ref 22–32)
TCO2: 27 mmol/L (ref 22–32)
TCO2: 27 mmol/L (ref 22–32)
pCO2 arterial: 43.1 mmHg (ref 32.0–48.0)
pCO2 arterial: 44 mmHg (ref 32.0–48.0)
pCO2 arterial: 48.1 mmHg — ABNORMAL HIGH (ref 32.0–48.0)
pH, Arterial: 7.39 (ref 7.350–7.450)
pH, Arterial: 7.375 (ref 7.350–7.450)
pH, Arterial: 7.329 — ABNORMAL LOW (ref 7.350–7.450)
pO2, Arterial: 112 mmHg — ABNORMAL HIGH (ref 83.0–108.0)
pO2, Arterial: 150 mmHg — ABNORMAL HIGH (ref 83.0–108.0)
pO2, Arterial: 138 mmHg — ABNORMAL HIGH (ref 83.0–108.0)

## 2018-09-16 LAB — POCT I-STAT, CHEM 8
BUN: 38 mg/dL — ABNORMAL HIGH (ref 8–23)
Calcium, Ion: 1.11 mmol/L — ABNORMAL LOW (ref 1.15–1.40)
Chloride: 101 mmol/L (ref 98–111)
Creatinine, Ser: 7.1 mg/dL — ABNORMAL HIGH (ref 0.61–1.24)
Glucose, Bld: 183 mg/dL — ABNORMAL HIGH (ref 70–99)
HCT: 29 % — ABNORMAL LOW (ref 39.0–52.0)
Hemoglobin: 9.9 g/dL — ABNORMAL LOW (ref 13.0–17.0)
Potassium: 6.8 mmol/L (ref 3.5–5.1)
Sodium: 134 mmol/L — ABNORMAL LOW (ref 135–145)
TCO2: 24 mmol/L (ref 22–32)

## 2018-09-16 LAB — CBC
HCT: 29 % — ABNORMAL LOW (ref 39.0–52.0)
HCT: 31.8 % — ABNORMAL LOW (ref 39.0–52.0)
HCT: 29.2 % — ABNORMAL LOW (ref 39.0–52.0)
Hemoglobin: 9.4 g/dL — ABNORMAL LOW (ref 13.0–17.0)
Hemoglobin: 10.7 g/dL — ABNORMAL LOW (ref 13.0–17.0)
Hemoglobin: 9.7 g/dL — ABNORMAL LOW (ref 13.0–17.0)
MCH: 35.3 pg — ABNORMAL HIGH (ref 26.0–34.0)
MCH: 35.1 pg — ABNORMAL HIGH (ref 26.0–34.0)
MCH: 34.3 pg — ABNORMAL HIGH (ref 26.0–34.0)
MCHC: 32.4 g/dL (ref 30.0–36.0)
MCHC: 33.6 g/dL (ref 30.0–36.0)
MCHC: 33.2 g/dL (ref 30.0–36.0)
MCV: 109 fL — ABNORMAL HIGH (ref 80.0–100.0)
MCV: 104.3 fL — ABNORMAL HIGH (ref 80.0–100.0)
MCV: 103.2 fL — ABNORMAL HIGH (ref 80.0–100.0)
Platelets: 125 10*3/uL — ABNORMAL LOW (ref 150–400)
Platelets: 114 10*3/uL — ABNORMAL LOW (ref 150–400)
Platelets: 104 10*3/uL — ABNORMAL LOW (ref 150–400)
RBC: 2.66 MIL/uL — ABNORMAL LOW (ref 4.22–5.81)
RBC: 3.05 MIL/uL — ABNORMAL LOW (ref 4.22–5.81)
RBC: 2.83 MIL/uL — ABNORMAL LOW (ref 4.22–5.81)
RDW: 14.1 % (ref 11.5–15.5)
RDW: 17.2 % — ABNORMAL HIGH (ref 11.5–15.5)
RDW: 19.2 % — ABNORMAL HIGH (ref 11.5–15.5)
WBC: 5.6 10*3/uL (ref 4.0–10.5)
WBC: 12.4 10*3/uL — ABNORMAL HIGH (ref 4.0–10.5)
WBC: 10.1 10*3/uL (ref 4.0–10.5)
nRBC: 0 % (ref 0.0–0.2)
nRBC: 0 % (ref 0.0–0.2)
nRBC: 0 % (ref 0.0–0.2)

## 2018-09-16 LAB — BASIC METABOLIC PANEL
Anion gap: 11 (ref 5–15)
BUN: 37 mg/dL — ABNORMAL HIGH (ref 8–23)
CO2: 23 mmol/L (ref 22–32)
Calcium: 8.2 mg/dL — ABNORMAL LOW (ref 8.9–10.3)
Chloride: 103 mmol/L (ref 98–111)
Creatinine, Ser: 7.36 mg/dL — ABNORMAL HIGH (ref 0.61–1.24)
GFR calc Af Amer: 8 mL/min — ABNORMAL LOW (ref >60)
GFR calc non Af Amer: 7 mL/min — ABNORMAL LOW (ref >60)
Glucose, Bld: 194 mg/dL — ABNORMAL HIGH (ref 70–99)
Potassium: 7 mmol/L (ref 3.5–5.1)
Sodium: 137 mmol/L (ref 135–145)

## 2018-09-16 LAB — POTASSIUM: Potassium: 6.6 mmol/L (ref 3.5–5.1)

## 2018-09-16 LAB — POCT I-STAT 4, (NA,K, GLUC, HGB,HCT)
Glucose, Bld: 111 mg/dL — ABNORMAL HIGH (ref 70–99)
HCT: 33 % — ABNORMAL LOW (ref 39.0–52.0)
Hemoglobin: 11.2 g/dL — ABNORMAL LOW (ref 13.0–17.0)
Potassium: 5.3 mmol/L — ABNORMAL HIGH (ref 3.5–5.1)
Sodium: 137 mmol/L (ref 135–145)

## 2018-09-16 LAB — HEPARIN LEVEL (UNFRACTIONATED): Heparin Unfractionated: 0.31 IU/mL (ref 0.30–0.70)

## 2018-09-16 LAB — ECHO INTRAOPERATIVE TEE
Height: 71 in
Weight: 2996.8 oz

## 2018-09-16 LAB — HEMOGLOBIN AND HEMATOCRIT, BLOOD
HCT: 21 % — ABNORMAL LOW (ref 39.0–52.0)
Hemoglobin: 6.8 g/dL — CL (ref 13.0–17.0)

## 2018-09-16 LAB — PROTIME-INR
INR: 1.6 — ABNORMAL HIGH (ref 0.8–1.2)
Prothrombin Time: 18.7 seconds — ABNORMAL HIGH (ref 11.4–15.2)

## 2018-09-16 LAB — MAGNESIUM: Magnesium: 2.2 mg/dL (ref 1.7–2.4)

## 2018-09-16 LAB — GLUCOSE, CAPILLARY
Glucose-Capillary: 109 mg/dL — ABNORMAL HIGH (ref 70–99)
Glucose-Capillary: 114 mg/dL — ABNORMAL HIGH (ref 70–99)
Glucose-Capillary: 169 mg/dL — ABNORMAL HIGH (ref 70–99)
Glucose-Capillary: 125 mg/dL — ABNORMAL HIGH (ref 70–99)

## 2018-09-16 LAB — APTT: aPTT: 31 seconds (ref 24–36)

## 2018-09-16 LAB — PLATELET COUNT: Platelets: 110 10*3/uL — ABNORMAL LOW (ref 150–400)

## 2018-09-16 LAB — PREPARE RBC (CROSSMATCH)

## 2018-09-16 SURGERY — REDO STERNOTOMY
Anesthesia: General | Site: Chest

## 2018-09-16 MED ORDER — SODIUM CHLORIDE 0.9 % IV SOLN
INTRAVENOUS | Status: DC | PRN
  Administered 2018-09-16: 13:00:00 via INTRAVENOUS

## 2018-09-16 MED ORDER — SODIUM CHLORIDE 0.9 % IV SOLN
INTRAVENOUS | Status: DC
  Administered 2018-09-16 – 2018-09-17 (×2): via INTRAVENOUS

## 2018-09-16 MED ORDER — DEXTROSE 50 % IV SOLN
INTRAVENOUS | Status: AC
  Administered 2018-09-16: 18:00:00 50 mL via INTRAVENOUS
  Filled 2018-09-16: qty 50

## 2018-09-16 MED ORDER — CALCIUM CHLORIDE 10 % IV SOLN
INTRAVENOUS | Status: DC | PRN
  Administered 2018-09-16: 100 mg via INTRAVENOUS
  Administered 2018-09-16: 200 mg via INTRAVENOUS

## 2018-09-16 MED ORDER — SODIUM BICARBONATE 8.4 % IV SOLN
50.0000 meq | Freq: Once | INTRAVENOUS | Status: AC
  Administered 2018-09-16: 18:00:00 50 meq via INTRAVENOUS

## 2018-09-16 MED ORDER — LACTATED RINGERS IV SOLN: 500.0000 mL | Freq: Once | INTRAVENOUS | Status: DC | PRN

## 2018-09-16 MED ORDER — INSULIN REGULAR(HUMAN) IN NACL 100-0.9 UT/100ML-% IV SOLN: INTRAVENOUS | Status: DC

## 2018-09-16 MED ORDER — METOPROLOL TARTRATE 12.5 MG HALF TABLET
12.5000 mg | ORAL_TABLET | Freq: Two times a day (BID) | ORAL | Status: DC
  Administered 2018-09-17 – 2018-09-21 (×5): 12.5 mg via ORAL
  Filled 2018-09-16 (×7): qty 1

## 2018-09-16 MED ORDER — SODIUM POLYSTYRENE SULFONATE 15 GM/60ML PO SUSP
15.0000 g | Freq: Once | ORAL | Status: DC
  Filled 2018-09-16: qty 60

## 2018-09-16 MED ORDER — MORPHINE SULFATE (PF) 2 MG/ML IV SOLN
1.0000 mg | INTRAVENOUS | Status: DC | PRN
  Administered 2018-09-16 – 2018-09-17 (×3): 2 mg via INTRAVENOUS
  Administered 2018-09-17: 4 mg via INTRAVENOUS
  Administered 2018-09-21: 2 mg via INTRAVENOUS
  Filled 2018-09-16: qty 1
  Filled 2018-09-16: qty 2
  Filled 2018-09-16 (×3): qty 1

## 2018-09-16 MED ORDER — CHLORHEXIDINE GLUCONATE 0.12 % MT SOLN
15.0000 mL | OROMUCOSAL | Status: AC
  Administered 2018-09-16: 14:00:00 15 mL via OROMUCOSAL

## 2018-09-16 MED ORDER — ROCURONIUM BROMIDE 10 MG/ML (PF) SYRINGE
PREFILLED_SYRINGE | INTRAVENOUS | Status: AC
  Filled 2018-09-16: qty 10

## 2018-09-16 MED ORDER — DEXMEDETOMIDINE HCL IN NACL 200 MCG/50ML IV SOLN: 0.0000 ug/kg/h | INTRAVENOUS | Status: DC

## 2018-09-16 MED ORDER — SODIUM CHLORIDE 0.9% FLUSH: 3.0000 mL | INTRAVENOUS | Status: DC | PRN

## 2018-09-16 MED ORDER — ONDANSETRON HCL 4 MG/2ML IJ SOLN
INTRAMUSCULAR | Status: AC
  Filled 2018-09-16: qty 2

## 2018-09-16 MED ORDER — SODIUM BICARBONATE 8.4 % IV SOLN
50.0000 meq | Freq: Once | INTRAVENOUS | Status: AC
  Administered 2018-09-17: 50 meq via INTRAVENOUS
  Filled 2018-09-16: qty 50

## 2018-09-16 MED ORDER — DEXTROSE 50 % IV SOLN
INTRAVENOUS | Status: AC
  Filled 2018-09-16: qty 50

## 2018-09-16 MED ORDER — SODIUM CHLORIDE 0.9% IV SOLUTION: Freq: Once | INTRAVENOUS | Status: DC

## 2018-09-16 MED ORDER — HEMOSTATIC AGENTS (NO CHARGE) OPTIME
TOPICAL | Status: DC | PRN
  Administered 2018-09-16: 1 via TOPICAL

## 2018-09-16 MED ORDER — DOCUSATE SODIUM 100 MG PO CAPS
200.0000 mg | ORAL_CAPSULE | Freq: Every day | ORAL | Status: DC
  Administered 2018-09-17 – 2018-09-21 (×4): 200 mg via ORAL
  Filled 2018-09-16 (×5): qty 2

## 2018-09-16 MED ORDER — DEXTROSE 50 % IV SOLN: 1.0000 | Freq: Once | INTRAVENOUS | Status: AC

## 2018-09-16 MED ORDER — PROPOFOL 10 MG/ML IV BOLUS
INTRAVENOUS | Status: DC | PRN
  Administered 2018-09-16: 70 mg via INTRAVENOUS

## 2018-09-16 MED ORDER — DEXAMETHASONE SODIUM PHOSPHATE 10 MG/ML IJ SOLN
INTRAMUSCULAR | Status: AC
  Filled 2018-09-16: qty 1

## 2018-09-16 MED ORDER — FENTANYL CITRATE (PF) 250 MCG/5ML IJ SOLN
INTRAMUSCULAR | Status: AC
  Filled 2018-09-16: qty 5

## 2018-09-16 MED ORDER — ACETAMINOPHEN 160 MG/5ML PO SOLN
1000.0000 mg | Freq: Four times a day (QID) | ORAL | Status: DC
  Filled 2018-09-16: qty 40.6

## 2018-09-16 MED ORDER — TRANEXAMIC ACID 1000 MG/10ML IV SOLN
INTRAVENOUS | Status: DC | PRN
  Administered 2018-09-16: 1.5 mg/kg/h via INTRAVENOUS

## 2018-09-16 MED ORDER — LEVALBUTEROL HCL 0.63 MG/3ML IN NEBU
0.6300 mg | INHALATION_SOLUTION | Freq: Once | RESPIRATORY_TRACT | Status: AC
  Administered 2018-09-16: 19:00:00 0.63 mg via RESPIRATORY_TRACT
  Filled 2018-09-16: qty 3

## 2018-09-16 MED ORDER — SODIUM CHLORIDE 0.9 % IV SOLN
1.5000 g | Freq: Two times a day (BID) | INTRAVENOUS | Status: AC
  Administered 2018-09-16 – 2018-09-18 (×4): 1.5 g via INTRAVENOUS
  Filled 2018-09-16 (×4): qty 1.5

## 2018-09-16 MED ORDER — SODIUM CHLORIDE (PF) 0.9 % IJ SOLN
OROMUCOSAL | Status: DC | PRN
  Administered 2018-09-16 (×3): 4 mL via TOPICAL

## 2018-09-16 MED ORDER — SODIUM POLYSTYRENE SULFONATE 15 GM/60ML PO SUSP
45.0000 g | Freq: Once | ORAL | Status: AC
  Administered 2018-09-16: 19:00:00 45 g
  Filled 2018-09-16: qty 180

## 2018-09-16 MED ORDER — DEXTROSE 50 % IV SOLN
INTRAVENOUS | Status: DC | PRN
  Administered 2018-09-16: 12.5 g via INTRAVENOUS
  Administered 2018-09-16: 7.5 g via INTRAVENOUS

## 2018-09-16 MED ORDER — ONDANSETRON HCL 4 MG/2ML IJ SOLN: 4.0000 mg | Freq: Four times a day (QID) | INTRAMUSCULAR | Status: DC | PRN

## 2018-09-16 MED ORDER — INSULIN ASPART 100 UNIT/ML ~~LOC~~ SOLN
10.0000 [IU] | Freq: Once | SUBCUTANEOUS | Status: AC
  Administered 2018-09-16: 18:00:00 10 [IU] via SUBCUTANEOUS

## 2018-09-16 MED ORDER — LIDOCAINE 2% (20 MG/ML) 5 ML SYRINGE
INTRAMUSCULAR | Status: DC | PRN
  Administered 2018-09-16: 50 mg via INTRAVENOUS

## 2018-09-16 MED ORDER — POTASSIUM CHLORIDE 10 MEQ/50ML IV SOLN: 10.0000 meq | INTRAVENOUS | Status: AC

## 2018-09-16 MED ORDER — LACTATED RINGERS IV SOLN: INTRAVENOUS | Status: DC

## 2018-09-16 MED ORDER — PHENYLEPHRINE HCL-NACL 20-0.9 MG/250ML-% IV SOLN
0.0000 ug/min | INTRAVENOUS | Status: DC
  Administered 2018-09-17: 40 ug/min via INTRAVENOUS
  Administered 2018-09-17: 10 ug/min via INTRAVENOUS
  Administered 2018-09-18: 25 ug/min via INTRAVENOUS
  Administered 2018-09-19: 15 ug/min via INTRAVENOUS
  Administered 2018-09-19: 45 ug/min via INTRAVENOUS
  Filled 2018-09-16 (×6): qty 250

## 2018-09-16 MED ORDER — SODIUM CHLORIDE 0.9 % IV SOLN
INTRAVENOUS | Status: DC | PRN
  Administered 2018-09-16: 20 ug/min via INTRAVENOUS

## 2018-09-16 MED ORDER — SODIUM CHLORIDE 0.9 % IV SOLN
INTRAVENOUS | Status: DC | PRN
  Administered 2018-09-16: 08:00:00 via INTRAVENOUS

## 2018-09-16 MED ORDER — SODIUM CHLORIDE 0.9% FLUSH
3.0000 mL | Freq: Two times a day (BID) | INTRAVENOUS | Status: DC
  Administered 2018-09-17 – 2018-09-21 (×8): 3 mL via INTRAVENOUS

## 2018-09-16 MED ORDER — MIDAZOLAM HCL 2 MG/2ML IJ SOLN: 2.0000 mg | INTRAMUSCULAR | Status: DC | PRN

## 2018-09-16 MED ORDER — DEXTROSE 50 % IV SOLN
INTRAVENOUS | Status: AC
  Administered 2018-09-16: 50 mL via INTRAVENOUS
  Filled 2018-09-16: qty 50

## 2018-09-16 MED ORDER — SODIUM ZIRCONIUM CYCLOSILICATE 5 G PO PACK
5.0000 g | PACK | Freq: Once | ORAL | Status: AC
  Administered 2018-09-17: 5 g via ORAL
  Filled 2018-09-16: qty 1

## 2018-09-16 MED ORDER — ROCURONIUM BROMIDE 10 MG/ML (PF) SYRINGE
PREFILLED_SYRINGE | INTRAVENOUS | Status: DC | PRN
  Administered 2018-09-16: 50 mg via INTRAVENOUS
  Administered 2018-09-16: 30 mg via INTRAVENOUS
  Administered 2018-09-16: 70 mg via INTRAVENOUS

## 2018-09-16 MED ORDER — TRAMADOL HCL 50 MG PO TABS
50.0000 mg | ORAL_TABLET | ORAL | Status: DC | PRN
  Administered 2018-09-18 (×2): 100 mg via ORAL
  Administered 2018-09-20: 50 mg via ORAL
  Filled 2018-09-16: qty 2
  Filled 2018-09-16: qty 1
  Filled 2018-09-16: qty 2
  Filled 2018-09-16: qty 1

## 2018-09-16 MED ORDER — ACETAMINOPHEN 500 MG PO TABS
1000.0000 mg | ORAL_TABLET | Freq: Four times a day (QID) | ORAL | Status: DC
  Administered 2018-09-16 – 2018-09-21 (×18): 1000 mg via ORAL
  Filled 2018-09-16 (×16): qty 2

## 2018-09-16 MED ORDER — SODIUM CHLORIDE 0.9 % IV SOLN: 250.0000 mL | INTRAVENOUS | Status: DC

## 2018-09-16 MED ORDER — INSULIN ASPART 100 UNIT/ML ~~LOC~~ SOLN
0.0000 [IU] | SUBCUTANEOUS | Status: DC
  Administered 2018-09-16: 2 [IU] via SUBCUTANEOUS
  Administered 2018-09-16: 21:00:00 4 [IU] via SUBCUTANEOUS
  Administered 2018-09-17 – 2018-09-19 (×4): 2 [IU] via SUBCUTANEOUS
  Administered 2018-09-19: 17:00:00 1 [IU] via SUBCUTANEOUS
  Administered 2018-09-19: 2 [IU] via SUBCUTANEOUS
  Administered 2018-09-20: 20:00:00 8 [IU] via SUBCUTANEOUS
  Administered 2018-09-21 (×2): 2 [IU] via SUBCUTANEOUS

## 2018-09-16 MED ORDER — HEPARIN SODIUM (PORCINE) 1000 UNIT/ML IJ SOLN
INTRAMUSCULAR | Status: DC | PRN
  Administered 2018-09-16: 2000 [IU] via INTRAVENOUS
  Administered 2018-09-16: 28000 [IU] via INTRAVENOUS

## 2018-09-16 MED ORDER — CALCIUM CHLORIDE 10 % IV SOLN
INTRAVENOUS | Status: AC
  Filled 2018-09-16: qty 10

## 2018-09-16 MED ORDER — FAMOTIDINE IN NACL 20-0.9 MG/50ML-% IV SOLN: 20.0000 mg | Freq: Two times a day (BID) | INTRAVENOUS | Status: AC

## 2018-09-16 MED ORDER — PROPOFOL 10 MG/ML IV BOLUS
INTRAVENOUS | Status: AC
  Filled 2018-09-16: qty 20

## 2018-09-16 MED ORDER — PANTOPRAZOLE SODIUM 40 MG PO TBEC
40.0000 mg | DELAYED_RELEASE_TABLET | Freq: Every day | ORAL | Status: DC
  Administered 2018-09-18 – 2018-09-21 (×4): 40 mg via ORAL
  Filled 2018-09-16 (×4): qty 1

## 2018-09-16 MED ORDER — DEXTROSE 50 % IV SOLN
1.0000 | Freq: Once | INTRAVENOUS | Status: AC
  Administered 2018-09-16 (×2): 50 mL via INTRAVENOUS

## 2018-09-16 MED ORDER — MAGNESIUM SULFATE 4 GM/100ML IV SOLN: 4.0000 g | Freq: Once | INTRAVENOUS | Status: DC

## 2018-09-16 MED ORDER — ALBUMIN HUMAN 5 % IV SOLN
250.0000 mL | INTRAVENOUS | Status: AC | PRN
  Administered 2018-09-16 (×2): 12.5 g via INTRAVENOUS

## 2018-09-16 MED ORDER — FENTANYL CITRATE (PF) 250 MCG/5ML IJ SOLN
INTRAMUSCULAR | Status: DC | PRN
  Administered 2018-09-16: 100 ug via INTRAVENOUS
  Administered 2018-09-16: 150 ug via INTRAVENOUS
  Administered 2018-09-16: 225 ug via INTRAVENOUS
  Administered 2018-09-16: 25 ug via INTRAVENOUS

## 2018-09-16 MED ORDER — LIDOCAINE 2% (20 MG/ML) 5 ML SYRINGE
INTRAMUSCULAR | Status: AC
  Filled 2018-09-16: qty 5

## 2018-09-16 MED ORDER — DEXAMETHASONE SODIUM PHOSPHATE 10 MG/ML IJ SOLN
INTRAMUSCULAR | Status: DC | PRN
  Administered 2018-09-16: 4 mg via INTRAVENOUS

## 2018-09-16 MED ORDER — SODIUM CHLORIDE 0.45 % IV SOLN: INTRAVENOUS | Status: DC | PRN

## 2018-09-16 MED ORDER — INSULIN ASPART 100 UNIT/ML ~~LOC~~ SOLN
10.0000 [IU] | Freq: Once | SUBCUTANEOUS | Status: AC
  Administered 2018-09-16: 10 [IU] via SUBCUTANEOUS

## 2018-09-16 MED ORDER — DEXMEDETOMIDINE HCL IN NACL 400 MCG/100ML IV SOLN
INTRAVENOUS | Status: DC | PRN
  Administered 2018-09-16: .5 ug/kg/h via INTRAVENOUS

## 2018-09-16 MED ORDER — INSULIN REGULAR BOLUS VIA INFUSION
0.0000 [IU] | Freq: Three times a day (TID) | INTRAVENOUS | Status: DC
  Filled 2018-09-16: qty 10

## 2018-09-16 MED ORDER — NITROGLYCERIN IN D5W 200-5 MCG/ML-% IV SOLN: 0.0000 ug/min | INTRAVENOUS | Status: DC

## 2018-09-16 MED ORDER — ASPIRIN EC 325 MG PO TBEC: 325.0000 mg | DELAYED_RELEASE_TABLET | Freq: Every day | ORAL | Status: DC

## 2018-09-16 MED ORDER — BISACODYL 5 MG PO TBEC
10.0000 mg | DELAYED_RELEASE_TABLET | Freq: Every day | ORAL | Status: DC
  Administered 2018-09-17 – 2018-09-21 (×4): 10 mg via ORAL
  Filled 2018-09-16 (×4): qty 2

## 2018-09-16 MED ORDER — PROTAMINE SULFATE 10 MG/ML IV SOLN
INTRAVENOUS | Status: DC | PRN
  Administered 2018-09-16: 20 mg via INTRAVENOUS
  Administered 2018-09-16: 280 mg via INTRAVENOUS

## 2018-09-16 MED ORDER — MIDAZOLAM HCL 5 MG/5ML IJ SOLN
INTRAMUSCULAR | Status: DC | PRN
  Administered 2018-09-16: 2 mg via INTRAVENOUS

## 2018-09-16 MED ORDER — ACETAMINOPHEN 650 MG RE SUPP
650.0000 mg | Freq: Once | RECTAL | Status: AC
  Administered 2018-09-16: 14:00:00 650 mg via RECTAL

## 2018-09-16 MED ORDER — LEVALBUTEROL HCL 0.63 MG/3ML IN NEBU
0.6300 mg | INHALATION_SOLUTION | Freq: Once | RESPIRATORY_TRACT | Status: AC
  Administered 2018-09-16: 0.63 mg via RESPIRATORY_TRACT
  Filled 2018-09-16: qty 3

## 2018-09-16 MED ORDER — METOPROLOL TARTRATE 25 MG/10 ML ORAL SUSPENSION: 12.5000 mg | Freq: Two times a day (BID) | ORAL | Status: DC

## 2018-09-16 MED ORDER — ASPIRIN 81 MG PO CHEW: 324.0000 mg | CHEWABLE_TABLET | Freq: Every day | ORAL | Status: DC

## 2018-09-16 MED ORDER — METOPROLOL TARTRATE 5 MG/5ML IV SOLN: 2.5000 mg | INTRAVENOUS | Status: DC | PRN

## 2018-09-16 MED ORDER — PHENYLEPHRINE 40 MCG/ML (10ML) SYRINGE FOR IV PUSH (FOR BLOOD PRESSURE SUPPORT)
PREFILLED_SYRINGE | INTRAVENOUS | Status: DC | PRN
  Administered 2018-09-16 (×3): 80 ug via INTRAVENOUS
  Administered 2018-09-16: 40 ug via INTRAVENOUS

## 2018-09-16 MED ORDER — ACETAMINOPHEN 160 MG/5ML PO SOLN: 650.0000 mg | Freq: Once | ORAL | Status: AC

## 2018-09-16 MED ORDER — SODIUM CHLORIDE 0.9 % IV SOLN
INTRAVENOUS | Status: DC | PRN
  Administered 2018-09-16: .5 [IU]/h via INTRAVENOUS
  Administered 2018-09-16 (×2): 5 [IU] via INTRAVENOUS

## 2018-09-16 MED ORDER — MIDAZOLAM HCL (PF) 10 MG/2ML IJ SOLN
INTRAMUSCULAR | Status: AC
  Filled 2018-09-16: qty 2

## 2018-09-16 MED ORDER — BISACODYL 10 MG RE SUPP: 10.0000 mg | Freq: Every day | RECTAL | Status: DC

## 2018-09-16 MED ORDER — VANCOMYCIN HCL IN DEXTROSE 1-5 GM/200ML-% IV SOLN
1000.0000 mg | Freq: Once | INTRAVENOUS | Status: AC
  Administered 2018-09-16: 1000 mg via INTRAVENOUS
  Filled 2018-09-16: qty 200

## 2018-09-16 MED ORDER — ESMOLOL HCL 100 MG/10ML IV SOLN
INTRAVENOUS | Status: AC
  Filled 2018-09-16: qty 10

## 2018-09-16 MED ORDER — OXYCODONE HCL 5 MG PO TABS
5.0000 mg | ORAL_TABLET | ORAL | Status: DC | PRN
  Administered 2018-09-17 (×2): 10 mg via ORAL
  Filled 2018-09-16 (×2): qty 2

## 2018-09-16 SURGICAL SUPPLY — 97 items
ADAPTER CARDIO PERF ANTE/RETRO (ADAPTER) ×2 IMPLANT
BAG DECANTER FOR FLEXI CONT (MISCELLANEOUS) ×4 IMPLANT
BANDAGE ACE 4X5 VEL STRL LF (GAUZE/BANDAGES/DRESSINGS) ×4 IMPLANT
BANDAGE ACE 6X5 VEL STRL LF (GAUZE/BANDAGES/DRESSINGS) ×4 IMPLANT
BASKET HEART  (ORDER IN 25'S) (MISCELLANEOUS) ×1
BASKET HEART (ORDER IN 25'S) (MISCELLANEOUS) ×1
BASKET HEART (ORDER IN 25S) (MISCELLANEOUS) ×2 IMPLANT
BLADE CORE FAN STRYKER (BLADE) ×6 IMPLANT
BLADE STERNUM SYSTEM 6 (BLADE) ×4 IMPLANT
BNDG GAUZE ELAST 4 BULKY (GAUZE/BANDAGES/DRESSINGS) ×4 IMPLANT
CANISTER SUCT 3000ML PPV (MISCELLANEOUS) ×4 IMPLANT
CANNULA EZ GLIDE AORTIC 21FR (CANNULA) ×4 IMPLANT
CANNULA GUNDRY RCSP 15FR (MISCELLANEOUS) ×2 IMPLANT
CATH CPB KIT HENDRICKSON (MISCELLANEOUS) ×4 IMPLANT
CATH ROBINSON RED A/P 18FR (CATHETERS) ×6 IMPLANT
CATH THORACIC 36FR (CATHETERS) ×4 IMPLANT
CATH THORACIC 36FR RT ANG (CATHETERS) ×4 IMPLANT
CLIP VESOCCLUDE MED 24/CT (CLIP) IMPLANT
CLIP VESOCCLUDE SM WIDE 24/CT (CLIP) ×8 IMPLANT
COVER WAND RF STERILE (DRAPES) ×2 IMPLANT
CRADLE DONUT ADULT HEAD (MISCELLANEOUS) ×4 IMPLANT
DRAIN CHANNEL 19F RND (DRAIN) ×2 IMPLANT
DRAIN CHANNEL 28F RND 3/8 FF (WOUND CARE) ×2 IMPLANT
DRAPE CARDIOVASCULAR INCISE (DRAPES) ×2
DRAPE SLUSH/WARMER DISC (DRAPES) ×4 IMPLANT
DRAPE SRG 135X102X78XABS (DRAPES) ×2 IMPLANT
DRSG COVADERM 4X14 (GAUZE/BANDAGES/DRESSINGS) ×4 IMPLANT
DRSG COVADERM 4X8 (GAUZE/BANDAGES/DRESSINGS) ×2 IMPLANT
ELECT REM PT RETURN 9FT ADLT (ELECTROSURGICAL) ×8
ELECTRODE REM PT RTRN 9FT ADLT (ELECTROSURGICAL) ×4 IMPLANT
EVACUATOR SILICONE 100CC (DRAIN) ×2 IMPLANT
FELT TEFLON 1X6 (MISCELLANEOUS) ×6 IMPLANT
GAUZE SPONGE 4X4 12PLY STRL (GAUZE/BANDAGES/DRESSINGS) ×8 IMPLANT
GAUZE SPONGE 4X4 12PLY STRL LF (GAUZE/BANDAGES/DRESSINGS) ×4 IMPLANT
GLOVE BIO SURGEON STRL SZ 6.5 (GLOVE) ×8 IMPLANT
GLOVE BIO SURGEON STRL SZ7 (GLOVE) ×4 IMPLANT
GLOVE BIO SURGEONS STRL SZ 6.5 (GLOVE) ×8
GLOVE SURG SIGNA 7.5 PF LTX (GLOVE) ×12 IMPLANT
GOWN STRL REUS W/ TWL LRG LVL3 (GOWN DISPOSABLE) ×8 IMPLANT
GOWN STRL REUS W/ TWL XL LVL3 (GOWN DISPOSABLE) ×4 IMPLANT
GOWN STRL REUS W/TWL LRG LVL3 (GOWN DISPOSABLE) ×8
GOWN STRL REUS W/TWL XL LVL3 (GOWN DISPOSABLE) ×12
HEMOSTAT POWDER SURGIFOAM 1G (HEMOSTASIS) ×12 IMPLANT
HEMOSTAT SURGICEL 2X14 (HEMOSTASIS) ×4 IMPLANT
INSERT FOGARTY XLG (MISCELLANEOUS) IMPLANT
KIT BASIN OR (CUSTOM PROCEDURE TRAY) ×4 IMPLANT
KIT SUCTION CATH 14FR (SUCTIONS) ×8 IMPLANT
KIT TURNOVER KIT B (KITS) ×4 IMPLANT
KIT VASOVIEW HEMOPRO 2 VH 4000 (KITS) ×4 IMPLANT
MARKER GRAFT CORONARY BYPASS (MISCELLANEOUS) ×12 IMPLANT
NDL 27GX1/2 REG BEVEL ECLIP (NEEDLE) IMPLANT
NEEDLE 27GX1/2 REG BEVEL ECLIP (NEEDLE) ×4 IMPLANT
NS IRRIG 1000ML POUR BTL (IV SOLUTION) ×20 IMPLANT
PACK E OPEN HEART (SUTURE) ×4 IMPLANT
PACK OPEN HEART (CUSTOM PROCEDURE TRAY) ×4 IMPLANT
PAD ARMBOARD 7.5X6 YLW CONV (MISCELLANEOUS) ×8 IMPLANT
PAD ELECT DEFIB RADIOL ZOLL (MISCELLANEOUS) ×4 IMPLANT
PENCIL BUTTON HOLSTER BLD 10FT (ELECTRODE) ×4 IMPLANT
PUNCH AORTIC ROTATE 4.0MM (MISCELLANEOUS) IMPLANT
PUNCH AORTIC ROTATE 4.5MM 8IN (MISCELLANEOUS) ×2 IMPLANT
PUNCH AORTIC ROTATE 5MM 8IN (MISCELLANEOUS) IMPLANT
SET CARDIOPLEGIA MPS 5001102 (MISCELLANEOUS) ×2 IMPLANT
SUT BONE WAX W31G (SUTURE) ×4 IMPLANT
SUT ETHIBOND NAB MH 2-0 36IN (SUTURE) ×2 IMPLANT
SUT ETHILON 3 0 PS 1 (SUTURE) ×4 IMPLANT
SUT MNCRL AB 4-0 PS2 18 (SUTURE) IMPLANT
SUT PROLENE 3 0 SH DA (SUTURE) ×4 IMPLANT
SUT PROLENE 4 0 RB 1 (SUTURE) ×4
SUT PROLENE 4 0 SH DA (SUTURE) IMPLANT
SUT PROLENE 4-0 RB1 .5 CRCL 36 (SUTURE) IMPLANT
SUT PROLENE 4-0 RB1 18X2 ARM (SUTURE) IMPLANT
SUT PROLENE 6 0 C 1 30 (SUTURE) ×12 IMPLANT
SUT PROLENE 7 0 BV1 MDA (SUTURE) ×6 IMPLANT
SUT PROLENE 8 0 BV175 6 (SUTURE) IMPLANT
SUT SILK  1 MH (SUTURE) ×2
SUT SILK 1 MH (SUTURE) IMPLANT
SUT SILK 2 0 SH CR/8 (SUTURE) ×2 IMPLANT
SUT STEEL 6MS V (SUTURE) ×4 IMPLANT
SUT STEEL STERNAL CCS#1 18IN (SUTURE) IMPLANT
SUT STEEL SZ 6 DBL 3X14 BALL (SUTURE) ×4 IMPLANT
SUT VIC AB 1 CTX 36 (SUTURE) ×4
SUT VIC AB 1 CTX36XBRD ANBCTR (SUTURE) ×4 IMPLANT
SUT VIC AB 2-0 CT1 27 (SUTURE) ×6
SUT VIC AB 2-0 CT1 TAPERPNT 27 (SUTURE) IMPLANT
SUT VIC AB 2-0 CTX 27 (SUTURE) IMPLANT
SUT VIC AB 3-0 SH 27 (SUTURE)
SUT VIC AB 3-0 SH 27X BRD (SUTURE) IMPLANT
SUT VIC AB 3-0 X1 27 (SUTURE) ×6 IMPLANT
SUT VICRYL 4-0 PS2 18IN ABS (SUTURE) IMPLANT
SYSTEM SAHARA CHEST DRAIN ATS (WOUND CARE) ×4 IMPLANT
TAPE CLOTH SURG 4X10 WHT LF (GAUZE/BANDAGES/DRESSINGS) ×2 IMPLANT
TOWEL GREEN STERILE (TOWEL DISPOSABLE) ×4 IMPLANT
TOWEL GREEN STERILE FF (TOWEL DISPOSABLE) ×4 IMPLANT
TRAY FOLEY SLVR 16FR TEMP STAT (SET/KITS/TRAYS/PACK) ×4 IMPLANT
TUBING LAP HI FLOW INSUFFLATIO (TUBING) ×4 IMPLANT
UNDERPAD 30X30 (UNDERPADS AND DIAPERS) ×4 IMPLANT
WATER STERILE IRR 1000ML POUR (IV SOLUTION) ×8 IMPLANT

## 2018-09-16 NOTE — Brief Op Note (Signed)
09/13/2018 - 09/16/2018  4:33 PM  PATIENT:  Jeremy [name]  63 y.o. male  PRE-OPERATIVE DIAGNOSIS:  2 VESSEL CAD  POST-OPERATIVE DIAGNOSIS:  2 Vessel CAD  PROCEDURE:  Procedure(s): REDO STERNOTOMY (N/A) CORONARY ARTERY BYPASS GRAFT times three      with left internal mammary artery and endoharvest of right leg greater saphenous vein (N/A) TRANSESOPHAGEAL ECHOCARDIOGRAM (TEE) (N/A) LIMA-LAD SVG-DIAG SVG-PD  SURGEON:  Surgeon(s) and Role:    * Melrose Nakayama, MD - Primary  PHYSICIAN ASSISTANT: WAYNE GOLD PA-C  ANESTHESIA:   general  EBL:  400 mL   BLOOD ADMINISTERED:none  DRAINS: PLEURAL AND PERICARDIAL CHEST TUBES   LOCAL MEDICATIONS USED:  NONE  SPECIMEN:  No Specimen  DISPOSITION OF SPECIMEN:  N/A  COUNTS:  YES  TOURNIQUET:  * No tourniquets in log *  DICTATION: .Other Dictation: Dictation Number PENDING  PLAN OF CARE: Admit to inpatient   PATIENT DISPOSITION:  ICU - intubated and hemodynamically stable.   Delay start of Pharmacological VTE agent (>24hrs) due to surgical blood loss or risk of bleeding: yes  COMPLICATIONS: NO KNOWN

## 2018-09-16 NOTE — Anesthesia Procedure Notes (Signed)
Central Venous Catheter Insertion Performed by: Effie Berkshire, MD, anesthesiologist Start/End6/02/2019 7:00 AM, 09/16/2018 7:10 AM Patient location: Pre-op. Preanesthetic checklist: patient identified, IV checked, site marked, risks and benefits discussed, surgical consent, monitors and equipment checked, pre-op evaluation, timeout performed and anesthesia consent Position: Trendelenburg Lidocaine 1% used for infiltration and patient sedated Hand hygiene performed , maximum sterile barriers used  and Seldinger technique used Catheter size: 9 Fr Total catheter length 10. Central line was placed.MAC introducer Swan type:thermodilution PA Cath depth:50 Procedure performed using ultrasound guided technique. Ultrasound Notes:anatomy identified, needle tip was noted to be adjacent to the nerve/plexus identified, no ultrasound evidence of intravascular and/or intraneural injection and image(s) printed for medical record Attempts: 1 Following insertion, line sutured and dressing applied. Post procedure assessment: blood return through all ports, free fluid flow and no air  Patient tolerated the procedure well with no immediate complications.

## 2018-09-16 NOTE — Anesthesia Procedure Notes (Signed)
Central Venous Catheter Insertion Performed by: Effie Berkshire, MD, anesthesiologist Start/End6/02/2019 7:10 AM, 09/16/2018 7:15 AM Patient location: Pre-op. Preanesthetic checklist: patient identified, IV checked, site marked, risks and benefits discussed, surgical consent, monitors and equipment checked, pre-op evaluation, timeout performed and anesthesia consent Hand hygiene performed  and maximum sterile barriers used  PA cath was placed.Swan type:thermodilution Procedure performed without using ultrasound guided technique. Attempts: 1 Patient tolerated the procedure well with no immediate complications.

## 2018-09-16 NOTE — Anesthesia Procedure Notes (Signed)
Procedure Name: [place] Performed by: Milford Cage, CRNA Pre-anesthesia Checklist: Patient identified, Emergency Drugs available, Suction available and Patient being monitored Patient Re-evaluated:Patient Re-evaluated prior to induction Oxygen Delivery Method: Circle System Utilized Preoxygenation: Pre-oxygenation with 100% oxygen Induction Type: IV induction Ventilation: Mask ventilation without difficulty and Oral airway inserted - appropriate to patient size Laryngoscope Size: Mac and 3 Grade View: Grade II Tube type: Oral Tube size: 8.0 mm Number of attempts: 1 Airway Equipment and Method: Stylet and Oral airway Placement Confirmation: ETT inserted through vocal cords under direct vision,  positive ETCO2 and breath sounds checked- equal and bilateral Secured at: 23 cm Tube secured with: Tape Dental Injury: Teeth and Oropharynx as per pre-operative assessment

## 2018-09-16 NOTE — Transfer of Care (Signed)
Immediate Anesthesia Transfer of Care Note  Patient: Jeremy [name]  Procedure(s) Performed: REDO STERNOTOMY (N/A ) CORONARY ARTERY BYPASS GRAFT times three      with left internal mammary artery and endoharvest of right leg greater saphenous vein (N/A Chest) TRANSESOPHAGEAL ECHOCARDIOGRAM (TEE) (N/A )  Patient Location: ICU  Anesthesia Type:General  Level of Consciousness: sedated and Patient remains intubated per anesthesia plan  Airway & Oxygen Therapy: Patient remains intubated per anesthesia plan and Patient placed on Ventilator (see vital sign flow sheet for setting)  Post-op Assessment: Report given to RN and Post -op Vital signs reviewed and stable  Post vital signs: Reviewed and stable  Last Vitals:  Vitals Value Taken Time  BP 114/53 09/16/18 1402  Temp 36.5 C 09/16/18 1405  Pulse 87 09/16/18 1405  Resp 13 09/16/18 1405  SpO2 100 % 09/16/18 1405  Vitals shown include unvalidated device data.  Last Pain:  Vitals:   09/15/18 2008  TempSrc:   PainSc: 0-No pain         Complications: No apparent anesthesia complications

## 2018-09-16 NOTE — Progress Notes (Addendum)
CRITICAL VALUE ALERT  Critical Value:  Potassium 6.5  Date & Time Notied:  09/16/18 1742  Provider Notified: MD paged; waiting for return call    1752 Orders received for kayexalate. RN instructed to notify renal.   1807 Spoke with nephrology who instructed RN to give 10 units of insulin, 1 amp D50, and xopenex tx. Re-check potassium in 4 hours.

## 2018-09-16 NOTE — Interval H&P Note (Signed)
History and Physical Interval Note:  09/16/2018 7:12 AM  Jeremy [name]  has presented today for surgery, with the diagnosis of 2 VESSEL CAD.  The various methods of treatment have been discussed with the patient and family. After consideration of risks, benefits and other options for treatment, the patient has consented to  Procedure(s): REDO STERNOTOMY (N/A) CORONARY ARTERY BYPASS GRAFTING (CABG) (N/A) TRANSESOPHAGEAL ECHOCARDIOGRAM (TEE) (N/A) as a surgical intervention.  The patient's history has been reviewed, patient examined, no change in status, stable for surgery.  I have reviewed the patient's chart and labs.  Questions were answered to the patient's satisfaction.     Melrose Nakayama

## 2018-09-16 NOTE — Anesthesia Preprocedure Evaluation (Addendum)
Anesthesia Evaluation  Patient identified by MRN, date of birth, ID band Patient awake    Reviewed: Allergy & Precautions, NPO status , Patient's Chart, lab work & pertinent test results  Airway Mallampati: III  TM Distance: >3 FB Neck ROM: Full    Dental  (+) Teeth Intact, Dental Advisory Given   Pulmonary sleep apnea ,    breath sounds clear to auscultation       Cardiovascular hypertension, + CAD, + Cardiac Stents and +CHF  + dysrhythmias Atrial Fibrillation + Valvular Problems/Murmurs AS  Rhythm:Regular Rate:Abnormal + Systolic murmurs - s/p AVR   Neuro/Psych Depression    GI/Hepatic Neg liver ROS, GERD  Medicated,  Endo/Other  negative endocrine ROS  Renal/GU ESRF and DialysisRenal disease     Musculoskeletal  (+) Arthritis ,   Abdominal Normal abdominal exam  (+)   Peds  Hematology negative hematology ROS (+)   Anesthesia Other Findings   Reproductive/Obstetrics                            Lab Results  Component Value Date   WBC 5.6 09/16/2018   HGB 9.4 (L) 09/16/2018   HCT 29.0 (L) 09/16/2018   MCV 109.0 (H) 09/16/2018   PLT 125 (L) 09/16/2018   Lab Results  Component Value Date   CREATININE 9.41 (H) 09/15/2018   BUN 49 (H) 09/15/2018   NA 137 09/15/2018   K 5.0 09/15/2018   CL 94 (L) 09/15/2018   CO2 28 09/15/2018   Lab Results  Component Value Date   INR 1.3 (H) 09/15/2018   INR 1.7 (H) 09/13/2018   INR 2.6 09/07/2018   Echo: 1. The left ventricle has moderate-severely reduced systolic function, with an ejection fraction of 30-35%. The cavity size was normal. There is mildly increased left ventricular wall thickness. Left ventricular diastolic Doppler parameters are  indeterminate. Left ventrical global hypokinesis without regional wall motion abnormalities.  2. The right ventricle has mildly reduced systolic function. The cavity was mildly enlarged. There is no  increase in right ventricular wall thickness.  3. Left atrial size was moderate to severely dilated.  4. A 26mm [employer] Jude mechanical prosthesis valve is present in the aortic position. Procedure Date: 06/15/12 Normal aortic valve prosthesis.Mean gradient within normal range for prosthetic   Anesthesia Physical Anesthesia Plan  ASA: IV  Anesthesia Plan: General   Post-op Pain Management:    Induction: Intravenous  PONV Risk Score and Plan: Ondansetron  Airway Management Planned: Oral ETT  Additional Equipment: Arterial line, CVP, PA Cath, TEE and Ultrasound Guidance Line Placement  Intra-op Plan:   Post-operative Plan: Post-operative intubation/ventilation  Informed Consent: I have reviewed the patients History and Physical, chart, labs and discussed the procedure including the risks, benefits and alternatives for the proposed anesthesia with the patient or authorized representative who has indicated his/her understanding and acceptance.     Dental advisory given  Plan Discussed with: CRNA  Anesthesia Plan Comments: (COVID-19 Labs  No results for input(s): DDIMER, FERRITIN, LDH, CRP in the last 72 hours.  Lab Results      Component                Value               Date                      [id number]  NEGATIVE            09/13/2018            )      Anesthesia Quick Evaluation

## 2018-09-16 NOTE — Progress Notes (Addendum)
[facility] Progress Note   Dialysis Orders: [place] MWF ([place]/ [facility]) 3h 22min 450/800 84.5kg Heparin none R AVF  Retracrit 6000 no Fe or VDRA Recent hgb 10.5 on renvela 5 and sensipar 30  Assessment/Plan: 1. S/p 2 vessel CABG -per Dr. Roxan Hockey 2. ESRD -MWF - will try to defer HD until Saturday -istat K 5.3 this afternoon -  draw post op BMP  and check again in am as istat tends to overstate K 3. Anemia - hgb 10.7 after transfusion 1 unit PRBC 6/11- not sure why he is on Retacrit instead of Mircera at outpatient unit.- will likely need to be ordered 4. Secondary hyperparathyroidism - holding po meds/ not on VDRA 5. BP /volume - volume ok on post op CXR- chronic low BP - on midodrine 10 pre HD and optional mid treatment dose 6. Nutrition - npo - advance as able 7. Afib - on warfarin and amio  Myriam Jacobson, PA-C [facility] Beeper [phone] 09/16/2018,4:33 PM  LOS: 3 days   Pt seen, examined and agree w A/P as above.  Kelly Splinter  MD 09/17/2018, 7:43 AM    Subjective:   Intubated  Objective Vitals:   09/16/18 1445 09/16/18 1500 09/16/18 1503 09/16/18 1521  BP:  120/72  (!) 121/55  Pulse: 67 76 75 70  Resp: 19 (!) 27 12 12   Temp: (!) 97.5 F (36.4 C) (!) 97.5 F (36.4 C) (!) 97.3 F (36.3 C)   TempSrc:      SpO2: 100% 100% 100% 100%  Weight:      Height:       Physical Exam General: Intubated Neck: + JVD Heart: RRR Lungs: grossly clear anteriorly Abdomen: soft Extremities: no LE edema Dialysis Access: left lower AVF + bruit   Additional Objective Labs: Basic Metabolic Panel: Recent Labs  Lab 09/13/18 1904 09/15/18 0555  NA 139 137  K 4.4 5.0  CL 95* 94*  CO2 30 28  GLUCOSE 106* 96  BUN 21 49*  CREATININE 6.24* 9.41*  CALCIUM 8.7* 8.7*   Liver Function Tests: Recent Labs  Lab 09/13/18 1904  AST 36  ALT 28  ALKPHOS 200*  BILITOT 0.6  PROT 7.3  ALBUMIN 3.7   No results for input(s): LIPASE,  AMYLASE in the last 168 hours. CBC: Recent Labs  Lab 09/13/18 1904 09/14/18 0304 09/15/18 0555 09/16/18 0303 09/16/18 1145 09/16/18 1400  WBC 6.5 5.5 5.9 5.6  --  12.4*  NEUTROABS 3.9  --   --   --   --   --   HGB 10.3* 9.9* 10.0* 9.4* 6.8* 10.7*  HCT 31.5* 31.0* 30.7* 29.0* 21.0* 31.8*  MCV 109.0* 109.5* 109.3* 109.0*  --  104.3*  PLT 177 135* 130* 125* 110* 114*   Blood Culture    Component Value Date/Time   SDES BLOOD  R AC 07/18/2016 2008   SPECREQUEST BOTTLES DRAWN AEROBIC AND ANAEROBIC BCHV 07/18/2016 2008   CULT NO GROWTH 5 DAYS 07/18/2016 2008   REPTSTATUS 07/23/2016 FINAL 07/18/2016 2008    Cardiac Enzymes: No results for input(s): CKTOTAL, CKMB, CKMBINDEX, TROPONINI in the last 168 hours. CBG: Recent Labs  Lab 09/15/18 1142 09/16/18 1503 09/16/18 1559  GLUCAP 72 109* 114*   Iron Studies: No results for input(s): IRON, TIBC, TRANSFERRIN, FERRITIN in the last 72 hours. Lab Results  Component Value Date   INR 1.6 (H) 09/16/2018   INR 1.3 (H) 09/15/2018   INR 1.7 (H) 09/13/2018   Studies/Results:  Dg Chest 2 View  Result Date: 09/14/2018 CLINICAL DATA:  Preop CABG history of aortic valve EXAM: CHEST - 2 VIEW COMPARISON:  07/17/2016 FINDINGS: Post sternotomy changes and valve replacement. No acute airspace disease or pleural effusion. Mild cardiomegaly without interval change. Aortic atherosclerosis. No pneumothorax. IMPRESSION: No active cardiopulmonary disease.  Mild cardiomegaly. Electronically Signed   By: Donavan Foil M.D.   On: 09/14/2018 21:41   Dg Chest Port 1 View  Result Date: 09/16/2018 CLINICAL DATA:  Bypass surgery. EXAM: PORTABLE CHEST 1 VIEW COMPARISON:  09/14/2018 FINDINGS: The endotracheal tube is 6.4 cm above the carina. The right IJ Swan-Ganz catheter tip is in the proximal right pulmonary artery. The NG tube is coursing down the esophagus and into the stomach. Left-sided chest tube in good position without definite pneumothorax. Mediastinal  drain tubes and epicardial leads are noted. Minimal streaky left basilar atelectasis but no infiltrates, edema or effusions. IMPRESSION: 1. Postoperative support apparatus in good position without complicating features. 2. Minimal basilar atelectasis but no edema, effusions or pneumothorax. Electronically Signed   By: Marijo Sanes M.D.   On: 09/16/2018 14:20   Vas US Doppler Pre Cabg  Result Date: 09/15/2018 PREOPERATIVE VASCULAR EVALUATION  Indications:  Pre-surgical evaluation. Risk Factors: Hypertension, hyperlipidemia, coronary artery disease. Performing Technologist: Maudry Mayhew MHA, RVT, RDCS, RDMS  Examination Guidelines: A complete evaluation includes B-mode imaging, spectral Doppler, color Doppler, and power Doppler as needed of all accessible portions of each vessel. Bilateral testing is considered an integral part of a complete examination. Limited examinations for reoccurring indications may be performed as noted.  Right Carotid Findings: +----------+-------+-------+--------+---------------------------------+--------+           PSV    EDV    StenosisDescribe                         Comments           cm/s   cm/s                                                     +----------+-------+-------+--------+---------------------------------+--------+ CCA Prox  71     13                                                       +----------+-------+-------+--------+---------------------------------+--------+ CCA Distal61     13             calcific and heterogenous                 +----------+-------+-------+--------+---------------------------------+--------+ ICA Prox  78     19             heterogenous, irregular and                                               calcific                                  +----------+-------+-------+--------+---------------------------------+--------+ ICA Distal97  23                                                        +----------+-------+-------+--------+---------------------------------+--------+ ECA       76                    calcific and heterogenous                 +----------+-------+-------+--------+---------------------------------+--------+ Portions of this table do not appear on this page. +----------+--------+-------+----------------+------------+           PSV cm/sEDV cmsDescribe        Arm Pressure +----------+--------+-------+----------------+------------+ Subclavian78             Multiphasic, EHO122          +----------+--------+-------+----------------+------------+ +---------+--------+--+--------+-+---------+ VertebralPSV cm/s37EDV cm/s9Antegrade +---------+--------+--+--------+-+---------+ Left Carotid Findings: +----------+-------+-------+--------+---------------------------------+--------+           PSV    EDV    StenosisDescribe                         Comments           cm/s   cm/s                                                     +----------+-------+-------+--------+---------------------------------+--------+ CCA Prox  140    19                                                       +----------+-------+-------+--------+---------------------------------+--------+ CCA Distal86     17             heterogenous, smooth and calcific         +----------+-------+-------+--------+---------------------------------+--------+ ICA Prox  169    44             irregular, heterogenous and                                               calcific                                  +----------+-------+-------+--------+---------------------------------+--------+ ICA Distal61     15                                                       +----------+-------+-------+--------+---------------------------------+--------+ ECA       79     6              calcific                                   +----------+-------+-------+--------+---------------------------------+--------+ +----------+--------+--------+----------------+------------+  SubclavianPSV cm/sEDV cm/sDescribe        Arm Pressure +----------+--------+--------+----------------+------------+           195             Multiphasic, WNL             +----------+--------+--------+----------------+------------+ +---------+--------+--+--------+--+---------+ VertebralPSV cm/s71EDV cm/s20Antegrade +---------+--------+--+--------+--+---------+  ABI Findings: +---------+------------------+-----+----------+-----------------------+ Right    Rt Pressure (mmHg)IndexWaveform  Comment                 +---------+------------------+-----+----------+-----------------------+ Brachial 127                    triphasic                         +---------+------------------+-----+----------+-----------------------+ PTA      Noncompressible        biphasic                          +---------+------------------+-----+----------+-----------------------+ DP       Noncompressible        monophasic                        +---------+------------------+-----+----------+-----------------------+ Great Toe                                 No discernable waveform +---------+------------------+-----+----------+-----------------------+ +---------+------------------+-----+----------+-----------------------+ Left     Lt Pressure (mmHg)IndexWaveform  Comment                 +---------+------------------+-----+----------+-----------------------+ PTA                             monophasic                        +---------+------------------+-----+----------+-----------------------+ DP                              monophasic                        +---------+------------------+-----+----------+-----------------------+ Great Toe                                 No discernable waveform  +---------+------------------+-----+----------+-----------------------+  Right Doppler Findings: +-----------+--------+-----+---------+-----------------------------------------+ Site       PressureIndexDoppler  Comments                                  +-----------+--------+-----+---------+-----------------------------------------+ Brachial   127          triphasic                                          +-----------+--------+-----+---------+-----------------------------------------+ Radial                  triphasic                                          +-----------+--------+-----+---------+-----------------------------------------+ Ulnar  triphasic                                          +-----------+--------+-----+---------+-----------------------------------------+ Palmar Arch                      Signal obliterates with radial                                             compression, is unaffected with ulnar                                      compression.                              +-----------+--------+-----+---------+-----------------------------------------+  Technologist Notes: Left upper extremity not evaluated due to presence of AVF.  Summary: Right Carotid: Velocities in the right ICA are consistent with a 1-39% stenosis. Left Carotid: Velocities in the left ICA are consistent with a 40-59% stenosis. Vertebrals:  Bilateral vertebral arteries demonstrate antegrade flow. Subclavians: Normal flow hemodynamics were seen in bilateral subclavian              arteries. Right ABI: Resting right ankle-brachial index indicates noncompressible right lower extremity arteries, suggestive of medical arterial calcifications. Waveforms suggest proximal obstruction. Unable to calculate toe-brachial index due to absent great toe waveforms. Left ABI: Resting left ankle-brachial index indicates noncompressible left lower extremity arteries, suggestive of  medical arterial calcifications. Waveforms suggest proximal obstruction. Unable to calculate toe-brachial index due to absent great toe waveforms.    Preliminary    Medications: . sodium chloride Stopped (09/16/18 1550)  . [START ON 09/17/2018] sodium chloride    . sodium chloride 10 mL/hr at 09/16/18 1419  . albumin human    . cefUROXime (ZINACEF)  IV 200 mL/hr at 09/16/18 1600  . dexmedetomidine (PRECEDEX) IV infusion Stopped (09/16/18 1428)  . famotidine (PEPCID) IV Stopped (09/16/18 1447)  . insulin 0.5 mL/hr at 09/16/18 1600  . lactated ringers    . lactated ringers    . lactated ringers 20 mL/hr at 09/16/18 1600  . magnesium sulfate    . nitroGLYCERIN Stopped (09/16/18 1405)  . phenylephrine (NEO-SYNEPHRINE) Adult infusion 30 mcg/min (09/16/18 1600)  . potassium chloride    . vancomycin     . [START ON 09/17/2018] acetaminophen  1,000 mg Oral Q6H   Or  . [START ON 09/17/2018] acetaminophen (TYLENOL) oral liquid 160 mg/5 mL  1,000 mg Per Tube Q6H  . allopurinol  300 mg Oral Daily  . amiodarone  200 mg Oral BID  . [START ON 09/17/2018] aspirin EC  325 mg Oral Daily   Or  . [START ON 09/17/2018] aspirin  324 mg Per Tube Daily  . [START ON 09/17/2018] bisacodyl  10 mg Oral Daily   Or  . [START ON 09/17/2018] bisacodyl  10 mg Rectal Daily  . cinacalcet  30 mg Oral Q supper  . [START ON 09/17/2018] docusate sodium  200 mg Oral Daily  . FLUoxetine  10 mg Oral Daily  . insulin regular  0-10 Units Intravenous TID WC  . metoprolol tartrate  12.5 mg Oral  BID   Or  . metoprolol tartrate  12.5 mg Per Tube BID  . multivitamin  1 tablet Oral QHS  . mupirocin ointment  1 application Nasal BID  . [START ON 09/18/2018] pantoprazole  40 mg Oral Daily  . pravastatin  40 mg Oral q1800  . predniSONE  5 mg Oral Q breakfast  . [START ON 09/17/2018] sodium chloride flush  3 mL Intravenous Q12H

## 2018-09-16 NOTE — Progress Notes (Signed)
  Echocardiogram Echocardiogram Transesophageal has been performed.  [name] 09/16/2018, 9:27 AM

## 2018-09-16 NOTE — Progress Notes (Signed)
RT NOTES: Rapid wean protocol initiated

## 2018-09-16 NOTE — Progress Notes (Signed)
CT surgery p.m. Rounds  Patient extubated after CABG Postop potassium noted to be 6.5, Kayexalate administered through NG tube prior to extubation and bicarb and insulin / D 50  administered as well Nephrology aware of hyperkalemia Follow-up potassium level in 4 hours Hemodynamic stable Patient anuric on hemodialysis

## 2018-09-16 NOTE — Anesthesia Procedure Notes (Addendum)
Arterial Line Insertion Start/End6/02/2019 7:50 AM, 09/16/2018 7:55 AM Performed by: Effie Berkshire, MD, anesthesiologist  Patient location: Pre-op. Preanesthetic checklist: patient identified, IV checked, site marked, risks and benefits discussed, surgical consent, monitors and equipment checked, pre-op evaluation, timeout performed and anesthesia consent Lidocaine 1% used for infiltration Right, radial was placed Catheter size: 20 G Hand hygiene performed  and maximum sterile barriers used   Attempts: 1 Procedure performed without using ultrasound guided technique. Following insertion, dressing applied and Biopatch. Post procedure assessment: normal and unchanged  Patient tolerated the procedure well with no immediate complications.

## 2018-09-17 ENCOUNTER — Encounter (HOSPITAL_COMMUNITY): Payer: Self-pay | Admitting: Thoracic Surgery (Cardiothoracic Vascular Surgery)

## 2018-09-17 ENCOUNTER — Inpatient Hospital Stay (HOSPITAL_COMMUNITY): Payer: Medicare Other

## 2018-09-17 DIAGNOSIS — Z951 Presence of aortocoronary bypass graft: Secondary | ICD-10-CM

## 2018-09-17 LAB — BASIC METABOLIC PANEL
Anion gap: 9 (ref 5–15)
Anion gap: 11 (ref 5–15)
Anion gap: 9 (ref 5–15)
BUN: 34 mg/dL — ABNORMAL HIGH (ref 8–23)
BUN: 39 mg/dL — ABNORMAL HIGH (ref 8–23)
BUN: 24 mg/dL — ABNORMAL HIGH (ref 8–23)
CO2: 23 mmol/L (ref 22–32)
CO2: 26 mmol/L (ref 22–32)
CO2: 27 mmol/L (ref 22–32)
Calcium: 7.8 mg/dL — ABNORMAL LOW (ref 8.9–10.3)
Calcium: 8.5 mg/dL — ABNORMAL LOW (ref 8.9–10.3)
Calcium: 8.5 mg/dL — ABNORMAL LOW (ref 8.9–10.3)
Chloride: 105 mmol/L (ref 98–111)
Chloride: 100 mmol/L (ref 98–111)
Chloride: 102 mmol/L (ref 98–111)
Creatinine, Ser: 6.86 mg/dL — ABNORMAL HIGH (ref 0.61–1.24)
Creatinine, Ser: 8.04 mg/dL — ABNORMAL HIGH (ref 0.61–1.24)
Creatinine, Ser: 5.46 mg/dL — ABNORMAL HIGH (ref 0.61–1.24)
GFR calc Af Amer: 9 mL/min — ABNORMAL LOW (ref >60)
GFR calc Af Amer: 8 mL/min — ABNORMAL LOW (ref >60)
GFR calc Af Amer: 12 mL/min — ABNORMAL LOW (ref >60)
GFR calc non Af Amer: 8 mL/min — ABNORMAL LOW (ref >60)
GFR calc non Af Amer: 7 mL/min — ABNORMAL LOW (ref >60)
GFR calc non Af Amer: 10 mL/min — ABNORMAL LOW (ref >60)
Glucose, Bld: 139 mg/dL — ABNORMAL HIGH (ref 70–99)
Glucose, Bld: 77 mg/dL (ref 70–99)
Glucose, Bld: 131 mg/dL — ABNORMAL HIGH (ref 70–99)
Potassium: 6.5 mmol/L (ref 3.5–5.1)
Potassium: 5.9 mmol/L — ABNORMAL HIGH (ref 3.5–5.1)
Potassium: 4.7 mmol/L (ref 3.5–5.1)
Sodium: 137 mmol/L (ref 135–145)
Sodium: 137 mmol/L (ref 135–145)
Sodium: 138 mmol/L (ref 135–145)

## 2018-09-17 LAB — TYPE AND SCREEN
ABO/RH(D): O POS
Antibody Screen: NEGATIVE
Unit division: 0
Unit division: 0
Unit division: 0
Unit division: 0

## 2018-09-17 LAB — GLUCOSE, CAPILLARY
Glucose-Capillary: 110 mg/dL — ABNORMAL HIGH (ref 70–99)
Glucose-Capillary: 110 mg/dL — ABNORMAL HIGH (ref 70–99)
Glucose-Capillary: 129 mg/dL — ABNORMAL HIGH (ref 70–99)
Glucose-Capillary: 71 mg/dL (ref 70–99)
Glucose-Capillary: 91 mg/dL (ref 70–99)
Glucose-Capillary: 95 mg/dL (ref 70–99)

## 2018-09-17 LAB — MAGNESIUM
Magnesium: 2.4 mg/dL (ref 1.7–2.4)
Magnesium: 2.1 mg/dL (ref 1.7–2.4)

## 2018-09-17 LAB — BPAM RBC
Blood Product Expiration Date: 202006272359
Blood Product Expiration Date: 202006292359
Blood Product Expiration Date: 202007092359
Blood Product Expiration Date: 202007132359
ISSUE DATE / TIME: 202006110807
ISSUE DATE / TIME: 202006110807
ISSUE DATE / TIME: 202006110947
ISSUE DATE / TIME: 202006111854
Unit Type and Rh: 5100
Unit Type and Rh: 5100
Unit Type and Rh: 5100
Unit Type and Rh: 5100

## 2018-09-17 LAB — CBC
HCT: 25 % — ABNORMAL LOW (ref 39.0–52.0)
HCT: 23.3 % — ABNORMAL LOW (ref 39.0–52.0)
Hemoglobin: 8.3 g/dL — ABNORMAL LOW (ref 13.0–17.0)
Hemoglobin: 7.7 g/dL — ABNORMAL LOW (ref 13.0–17.0)
MCH: 34.7 pg — ABNORMAL HIGH (ref 26.0–34.0)
MCH: 35 pg — ABNORMAL HIGH (ref 26.0–34.0)
MCHC: 33.2 g/dL (ref 30.0–36.0)
MCHC: 33 g/dL (ref 30.0–36.0)
MCV: 104.6 fL — ABNORMAL HIGH (ref 80.0–100.0)
MCV: 105.9 fL — ABNORMAL HIGH (ref 80.0–100.0)
Platelets: 96 10*3/uL — ABNORMAL LOW (ref 150–400)
Platelets: 86 10*3/uL — ABNORMAL LOW (ref 150–400)
RBC: 2.39 MIL/uL — ABNORMAL LOW (ref 4.22–5.81)
RBC: 2.2 MIL/uL — ABNORMAL LOW (ref 4.22–5.81)
RDW: 19.5 % — ABNORMAL HIGH (ref 11.5–15.5)
RDW: 19.3 % — ABNORMAL HIGH (ref 11.5–15.5)
WBC: 10.9 10*3/uL — ABNORMAL HIGH (ref 4.0–10.5)
WBC: 10.3 10*3/uL (ref 4.0–10.5)
nRBC: 0 % (ref 0.0–0.2)
nRBC: 0 % (ref 0.0–0.2)

## 2018-09-17 MED ORDER — CHLORHEXIDINE GLUCONATE CLOTH 2 % EX PADS
6.0000 | MEDICATED_PAD | Freq: Every day | CUTANEOUS | Status: DC
  Administered 2018-09-18 – 2018-09-21 (×3): 6 via TOPICAL

## 2018-09-17 MED ORDER — CHLORHEXIDINE GLUCONATE CLOTH 2 % EX PADS
6.0000 | MEDICATED_PAD | Freq: Every day | CUTANEOUS | Status: DC
  Administered 2018-09-17 – 2018-09-21 (×5): 6 via TOPICAL

## 2018-09-17 MED ORDER — WARFARIN SODIUM 1 MG PO TABS
1.0000 mg | ORAL_TABLET | Freq: Every day | ORAL | Status: DC
  Administered 2018-09-17 – 2018-09-20 (×3): 1 mg via ORAL
  Filled 2018-09-17 (×3): qty 1

## 2018-09-17 MED ORDER — ALBUMIN HUMAN 5 % IV SOLN
12.5000 g | Freq: Once | INTRAVENOUS | Status: AC
  Administered 2018-09-17: 12.5 g via INTRAVENOUS
  Filled 2018-09-17: qty 250

## 2018-09-17 MED ORDER — ENOXAPARIN SODIUM 30 MG/0.3ML ~~LOC~~ SOLN
30.0000 mg | Freq: Every day | SUBCUTANEOUS | Status: DC
  Administered 2018-09-17 – 2018-09-18 (×2): 30 mg via SUBCUTANEOUS
  Filled 2018-09-17 (×2): qty 0.3

## 2018-09-17 MED ORDER — ASPIRIN EC 81 MG PO TBEC
81.0000 mg | DELAYED_RELEASE_TABLET | Freq: Every day | ORAL | Status: DC
  Administered 2018-09-17 – 2018-09-22 (×6): 81 mg via ORAL
  Filled 2018-09-17 (×6): qty 1

## 2018-09-17 MED ORDER — PHENYLEPHRINE HCL-NACL 10-0.9 MG/250ML-% IV SOLN
INTRAVENOUS | Status: AC
  Filled 2018-09-17: qty 250

## 2018-09-17 MED ORDER — WARFARIN - PHYSICIAN DOSING INPATIENT
Freq: Every day | Status: DC
  Administered 2018-09-18: 1

## 2018-09-17 MED FILL — Heparin Sodium (Porcine) Inj 1000 Unit/ML: INTRAMUSCULAR | Qty: 30 | Status: AC

## 2018-09-17 MED FILL — Magnesium Sulfate Inj 50%: INTRAMUSCULAR | Qty: 10 | Status: AC

## 2018-09-17 MED FILL — Potassium Chloride Inj 2 mEq/ML: INTRAVENOUS | Qty: 40 | Status: AC

## 2018-09-17 NOTE — Progress Notes (Signed)
CRITICAL VALUE ALERT  Critical Value:  Potassium 6.6  Date & Time Notied:  09/16/2018 2315  Provider Notified: Dr. Justin Mend, Dr. Prescott Gum  Orders Received/Actions taken: 10 units of insulin, 1 amp D50, 1 Amp Bicarb, Xopenex, Lokelma  Repeat BMET in 4 hrs

## 2018-09-17 NOTE — Progress Notes (Signed)
Subjective:  Some pain - overall doing well   Objective:  Vitals:   09/17/18 0400 09/17/18 0444 09/17/18 0500 09/17/18 0600  BP:  129/71    Pulse: 79 78 80 77  Resp: 11 (!) 23 10 12   Temp: 98.2 F (36.8 C) 98.1 F (36.7 C) 98.2 F (36.8 C) 98.2 F (36.8 C)  TempSrc: Core     SpO2: 100% 100% 100% 100%  Weight:   92.5 kg   Height:        Intake/Output from previous day:  Intake/Output Summary (Last 24 hours) at 09/17/2018 8315 Last data filed at 09/17/2018 0700 Gross per 24 hour  Intake 4203.34 ml  Output 690 ml  Net 3513.34 ml    Physical Exam: Affect appropriate Healthy:  appears stated age HEENT: normal Neck supple with no adenopathy JVP normal no bruits no thyromegaly Lungs clear with no wheezing and good diaphragmatic motion Heart:  V7/O1 click SEM no AR   murmur, no rub, gallop or click PMI normal post sternotomy x2  Subxiphoid chest tubes still in  Abdomen: benighn, BS positve, no tenderness, no AAA no bruit.  No HSM or HJR Drain in RLE from SVG harvest  No edema Neuro non-focal Skin warm and dry No muscular weakness   Lab Results: Basic Metabolic Panel: Recent Labs    09/16/18 2000 09/16/18 2012 09/16/18 2227 09/17/18 0414  NA 137 134*  --  137  K 7.0* 6.8* 6.6* 5.9*  CL 103 101  --  100  CO2 23  --   --  26  GLUCOSE 194* 183*  --  77  BUN 37* 38*  --  39*  CREATININE 7.36* 7.10*  --  8.04*  CALCIUM 8.2*  --   --  8.5*  MG 2.2  --   --  2.4   Liver Function Tests: No results for input(s): AST, ALT, ALKPHOS, BILITOT, PROT, ALBUMIN in the last 72 hours. No results for input(s): LIPASE, AMYLASE in the last 72 hours. CBC: Recent Labs    09/16/18 2000 09/16/18 2012 09/17/18 0414  WBC 10.1  --  10.9*  HGB 9.7* 9.9* 8.3*  HCT 29.2* 29.0* 25.0*  MCV 103.2*  --  104.6*  PLT 104*  --  96*    Imaging: Dg Chest Port 1 View  Result Date: 09/17/2018 CLINICAL DATA:  Status post cardiac surgery with chest pain, initial encounter EXAM:  PORTABLE CHEST 1 VIEW COMPARISON:  09/16/2018 FINDINGS: Postsurgical changes are again seen. Endotracheal tube and gastric catheter have been removed in the interval. Mediastinal drain and left thoracostomy catheter are noted as well as a pericardial drain. Swan-Ganz catheter is noted in the pulmonary outflow tract. Lungs are well aerated bilaterally. Mild left basilar atelectasis is seen. IMPRESSION: Tubes and lines as described above. Mild stable basilar atelectasis. Electronically Signed   By: Inez Catalina M.D.   On: 09/17/2018 07:46   Dg Chest Port 1 View  Result Date: 09/16/2018 CLINICAL DATA:  Bypass surgery. EXAM: PORTABLE CHEST 1 VIEW COMPARISON:  09/14/2018 FINDINGS: The endotracheal tube is 6.4 cm above the carina. The right IJ Swan-Ganz catheter tip is in the proximal right pulmonary artery. The NG tube is coursing down the esophagus and into the stomach. Left-sided chest tube in good position without definite pneumothorax. Mediastinal drain tubes and epicardial leads are noted. Minimal streaky left basilar atelectasis but no infiltrates, edema or effusions. IMPRESSION: 1. Postoperative support apparatus in good position without complicating features. 2. Minimal basilar  atelectasis but no edema, effusions or pneumothorax. Electronically Signed   By: Marijo Sanes M.D.   On: 09/16/2018 14:20   Vas US Doppler Pre Cabg  Result Date: 09/16/2018 PREOPERATIVE VASCULAR EVALUATION  Indications:      Pre-surgical evaluation. Risk Factors:     Hypertension, hyperlipidemia, coronary artery disease. Comparison Study: No prior study. Performing Technologist: Maudry Mayhew MHA, RVT, RDCS, RDMS  Examination Guidelines: A complete evaluation includes B-mode imaging, spectral Doppler, color Doppler, and power Doppler as needed of all accessible portions of each vessel. Bilateral testing is considered an integral part of a complete examination. Limited examinations for reoccurring indications may be performed  as noted.  Right Carotid Findings: +----------+-------+-------+--------+---------------------------------+--------+             PSV     EDV     Stenosis Describe                          Comments              cm/s    cm/s                                                         +----------+-------+-------+--------+---------------------------------+--------+  CCA Prox   71      13                                                           +----------+-------+-------+--------+---------------------------------+--------+  CCA Distal 61      13               calcific and heterogenous                   +----------+-------+-------+--------+---------------------------------+--------+  ICA Prox   78      19               heterogenous, irregular and                                                      calcific                                    +----------+-------+-------+--------+---------------------------------+--------+  ICA Distal 97      23                                                           +----------+-------+-------+--------+---------------------------------+--------+  ECA        76                       calcific and heterogenous                   +----------+-------+-------+--------+---------------------------------+--------+  Portions of this table do not appear on this page. +----------+--------+-------+----------------+------------+             PSV cm/s EDV cms Describe         Arm Pressure  +----------+--------+-------+----------------+------------+  Subclavian 78               Multiphasic, WNL 127           +----------+--------+-------+----------------+------------+ +---------+--------+--+--------+-+---------+  Vertebral PSV cm/s 37 EDV cm/s 9 Antegrade  +---------+--------+--+--------+-+---------+ Left Carotid Findings: +----------+-------+-------+--------+---------------------------------+--------+             PSV     EDV     Stenosis Describe                          Comments              cm/s    cm/s                                                          +----------+-------+-------+--------+---------------------------------+--------+  CCA Prox   140     19                                                           +----------+-------+-------+--------+---------------------------------+--------+  CCA Distal 86      17               heterogenous, smooth and calcific           +----------+-------+-------+--------+---------------------------------+--------+  ICA Prox   169     44               irregular, heterogenous and                                                      calcific                                    +----------+-------+-------+--------+---------------------------------+--------+  ICA Distal 61      15                                                           +----------+-------+-------+--------+---------------------------------+--------+  ECA        79      6                calcific                                    +----------+-------+-------+--------+---------------------------------+--------+ +----------+--------+--------+----------------+------------+  Subclavian PSV cm/s EDV cm/s Describe         Arm Pressure  +----------+--------+--------+----------------+------------+  195               Multiphasic, WNL               +----------+--------+--------+----------------+------------+ +---------+--------+--+--------+--+---------+  Vertebral PSV cm/s 71 EDV cm/s 20 Antegrade  +---------+--------+--+--------+--+---------+  ABI Findings: +---------+------------------+-----+----------+-----------------------+  Right     Rt Pressure (mmHg) Index Waveform   Comment                  +---------+------------------+-----+----------+-----------------------+  Brachial  127                      triphasic                           +---------+------------------+-----+----------+-----------------------+  PTA       Noncompressible          biphasic                             +---------+------------------+-----+----------+-----------------------+  DP        Noncompressible          monophasic                          +---------+------------------+-----+----------+-----------------------+  Great Toe                                     No discernable waveform  +---------+------------------+-----+----------+-----------------------+ +---------+------------------+-----+----------+-----------------------+  Left      Lt Pressure (mmHg) Index Waveform   Comment                  +---------+------------------+-----+----------+-----------------------+  PTA                                monophasic                          +---------+------------------+-----+----------+-----------------------+  DP                                 monophasic                          +---------+------------------+-----+----------+-----------------------+  Great Toe                                     No discernable waveform  +---------+------------------+-----+----------+-----------------------+  Right Doppler Findings: +-----------+--------+-----+---------+-----------------------------------------+  Site        Pressure Index Doppler   Comments                                   +-----------+--------+-----+---------+-----------------------------------------+  Brachial    127            triphasic                                            +-----------+--------+-----+---------+-----------------------------------------+  Radial  triphasic                                            +-----------+--------+-----+---------+-----------------------------------------+  Ulnar                      triphasic                                            +-----------+--------+-----+---------+-----------------------------------------+  Palmar Arch                          Signal obliterates with radial                                                   compression, is unaffected with ulnar                                             compression.                               +-----------+--------+-----+---------+-----------------------------------------+  Technologist Notes: Left upper extremity not evaluated due to presence of AVF.  Summary: Right Carotid: Velocities in the right ICA are consistent with a 1-39% stenosis. Left Carotid: Velocities in the left ICA are consistent with a 40-59% stenosis. Vertebrals:  Bilateral vertebral arteries demonstrate antegrade flow. Subclavians: Normal flow hemodynamics were seen in bilateral subclavian              arteries. Right ABI: Resting right ankle-brachial index indicates noncompressible right lower extremity arteries, suggestive of medical arterial calcifications. Waveforms suggest proximal obstruction. Unable to calculate toe-brachial index due to absent great toe waveforms. Left ABI: Resting left ankle-brachial index indicates noncompressible left lower extremity arteries, suggestive of medical arterial calcifications. Waveforms suggest proximal obstruction. Unable to calculate toe-brachial index due to absent great toe waveforms.  Electronically signed by Servando Snare MD on 09/16/2018 at 5:32:48 PM.    Final     Cardiac Studies:  ECG:  afib RBBB rate 80 09/17/2018    Telemetry: afib good rate control  Echo:   Medications:    acetaminophen  1,000 mg Oral Q6H   Or   acetaminophen (TYLENOL) oral liquid 160 mg/5 mL  1,000 mg Per Tube Q6H   allopurinol  300 mg Oral Daily   amiodarone  200 mg Oral BID   aspirin EC  325 mg Oral Daily   Or   aspirin  324 mg Per Tube Daily   bisacodyl  10 mg Oral Daily   Or   bisacodyl  10 mg Rectal Daily   Chlorhexidine Gluconate Cloth  6 each Topical Q0600   cinacalcet  30 mg Oral Q supper   docusate sodium  200 mg Oral Daily   enoxaparin (LOVENOX) injection  30 mg Subcutaneous QHS   FLUoxetine  10 mg Oral Daily   insulin aspart  0-24 Units Subcutaneous Q4H   metoprolol tartrate  12.5 mg  Oral BID   Or   metoprolol  tartrate  12.5 mg Per Tube BID   multivitamin  1 tablet Oral QHS   mupirocin ointment  1 application Nasal BID   [START ON 09/18/2018] pantoprazole  40 mg Oral Daily   pravastatin  40 mg Oral q1800   predniSONE  5 mg Oral Q breakfast   sodium chloride flush  3 mL Intravenous Q12H   warfarin  1 mg Oral q1800   Warfarin - Physician Dosing Inpatient   Does not apply q1800      sodium chloride 10 mL/hr at 09/17/18 0700   sodium chloride Stopped (09/17/18 0819)   sodium chloride 10 mL/hr at 09/17/18 0700   albumin human 12.5 g (09/16/18 1633)   cefUROXime (ZINACEF)  IV Stopped (09/17/18 0455)   dexmedetomidine (PRECEDEX) IV infusion Stopped (09/16/18 1428)   famotidine (PEPCID) IV Stopped (09/16/18 1447)   lactated ringers     lactated ringers     lactated ringers Stopped (09/17/18 0819)   magnesium sulfate     nitroGLYCERIN Stopped (09/16/18 1405)   phenylephrine (NEO-SYNEPHRINE) Adult infusion 10 mcg/min (09/17/18 0700)    Assessment/Plan:   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 Previous sternotomy for AVR Post CABG Day 2 Hopefully chest tubes out today   Afib:  Continue amiodarone  Rate is fine Heparin/coumadin per CVTS   Dialysis : good fistula in RUE dialysis for a couple hours latter today   HLD:  Continue statin   AVR:  21 mm ST Jude mechanical valve normal function 08/12/18   Jenkins Rouge 09/17/2018, 8:22 AM

## 2018-09-17 NOTE — Progress Notes (Signed)
[facility] Progress Note  Subjective: looks good, extubated, no sig SOB  Vitals:   09/17/18 0400 09/17/18 0444 09/17/18 0500 09/17/18 0600  BP:  129/71    Pulse: 79 78 80 77  Resp: 11 (!) 23 10 12   Temp: 98.2 F (36.8 C) 98.1 F (36.7 C) 98.2 F (36.8 C) 98.2 F (36.8 C)  TempSrc: Core     SpO2: 100% 100% 100% 100%  Weight:   92.5 kg   Height:        Inpatient medications: . acetaminophen  1,000 mg Oral Q6H   Or  . acetaminophen (TYLENOL) oral liquid 160 mg/5 mL  1,000 mg Per Tube Q6H  . allopurinol  300 mg Oral Daily  . amiodarone  200 mg Oral BID  . aspirin EC  81 mg Oral Daily  . bisacodyl  10 mg Oral Daily   Or  . bisacodyl  10 mg Rectal Daily  . Chlorhexidine Gluconate Cloth  6 each Topical Q0600  . cinacalcet  30 mg Oral Q supper  . docusate sodium  200 mg Oral Daily  . enoxaparin (LOVENOX) injection  30 mg Subcutaneous QHS  . FLUoxetine  10 mg Oral Daily  . insulin aspart  0-24 Units Subcutaneous Q4H  . metoprolol tartrate  12.5 mg Oral BID   Or  . metoprolol tartrate  12.5 mg Per Tube BID  . multivitamin  1 tablet Oral QHS  . mupirocin ointment  1 application Nasal BID  . [START ON 09/18/2018] pantoprazole  40 mg Oral Daily  . pravastatin  40 mg Oral q1800  . predniSONE  5 mg Oral Q breakfast  . sodium chloride flush  3 mL Intravenous Q12H  . warfarin  1 mg Oral q1800  . Warfarin - Physician Dosing Inpatient   Does not apply q1800   . sodium chloride 10 mL/hr at 09/17/18 0700  . sodium chloride Stopped (09/17/18 0819)  . sodium chloride 10 mL/hr at 09/17/18 0700  . albumin human 12.5 g (09/16/18 1633)  . cefUROXime (ZINACEF)  IV Stopped (09/17/18 0455)  . dexmedetomidine (PRECEDEX) IV infusion Stopped (09/16/18 1428)  . famotidine (PEPCID) IV Stopped (09/16/18 1447)  . lactated ringers    . lactated ringers    . lactated ringers Stopped (09/17/18 0819)  . magnesium sulfate    . nitroGLYCERIN Stopped (09/16/18 1405)  . phenylephrine  (NEO-SYNEPHRINE) Adult infusion 10 mcg/min (09/17/18 0700)   sodium chloride, albumin human, lactated ringers, metoprolol tartrate, midazolam, morphine injection, ondansetron (ZOFRAN) IV, oxyCODONE, sodium chloride flush, traMADol    Exam: General: off the vent, alert, no distress Neck: + JVD Heart: RRR Lungs: grossly clear anteriorly Abdomen: soft nontender Extremities: mild -mod edema Dialysis Access: left lower AVF + bruit  Dialysis Orders: [facility] MWF ([place]/ [facility]) 3h 4min 450/800 84.5kg Heparin none R AVF  Retracrit 6000 no Fe or VDRA Recent hgb 10.5 on renvela 5 and sensipar 30  Assessment/Plan: 1. S/p 2 vessel CABG -per Dr. Roxan Hockey 2. ESRD - usual HD MWF.  Plan HD today and tomorrow 2.5- 3hr each, small UF as tolerated, get K+ down 3. Anemia ckd/ abl - hgb 10.7. Hb 8.3 today. SP 1 unit PRBC 6/11. Not sure why he is on Retacrit instead of Mircera at outpatient unit. 4. Secondary hyperparathyroidism - holding po meds/ not on VDRA 5. BP /volume - chronic low BP - on midodrine 10 pre HD and optional mid treatment. Up 8kg, PA pressures normal 6. Nutrition - npo -  advance as able 7. Afib - on warfarin and [facility] 09/17/2018, 8:46 AM  Iron/TIBC/Ferritin/ %Sat    Component Value Date/Time   IRON 86 04/08/2017 1210   TIBC 234 (L) 04/08/2017 1210   FERRITIN 1,239 (H) 04/08/2017 1210   IRONPCTSAT 37 04/08/2017 1210   Recent Labs  Lab 09/13/18 1904  09/16/18 1400  09/17/18 0414  NA 139   < >  --    < > 137  K 4.4   < >  --    < > 5.9*  CL 95*   < >  --    < > 100  CO2 30   < >  --    < > 26  GLUCOSE 106*   < >  --    < > 77  BUN 21   < >  --    < > 39*  CREATININE 6.24*   < >  --    < > 8.04*  CALCIUM 8.7*   < >  --    < > 8.5*  ALBUMIN 3.7  --   --   --   --   INR 1.7*   < > 1.6*  --   --    < > = values in this interval not displayed.   Recent Labs  Lab 09/13/18 1904  AST 36  ALT 28  ALKPHOS 200*   BILITOT 0.6  PROT 7.3   Recent Labs  Lab 09/17/18 0414  WBC 10.9*  HGB 8.3*  HCT 25.0*  PLT 96*

## 2018-09-17 NOTE — Anesthesia Postprocedure Evaluation (Signed)
Anesthesia Post Note  Patient: ELISON WORREL  Procedure(s) Performed: REDO STERNOTOMY (N/A ) CORONARY ARTERY BYPASS GRAFT times three      with left internal mammary artery and endoharvest of right leg greater saphenous vein (N/A Chest) TRANSESOPHAGEAL ECHOCARDIOGRAM (TEE) (N/A )     Patient location during evaluation: SICU Anesthesia Type: General Level of consciousness: awake Pain management: pain level controlled Vital Signs Assessment: post-procedure vital signs reviewed and stable Respiratory status: spontaneous breathing Cardiovascular status: stable Postop Assessment: no apparent nausea or vomiting Anesthetic complications: no    Last Vitals:  Vitals:   09/17/18 1000 09/17/18 1015  BP: (!) 100/40 (!) 95/40  Pulse: 84 84  Resp:    Temp:    SpO2:      Last Pain:  Vitals:   09/17/18 0850  TempSrc: Oral  PainSc: 0-No pain                 Effie Berkshire

## 2018-09-17 NOTE — Progress Notes (Signed)
1 Day Post-Op Procedure(s) (LRB): REDO STERNOTOMY (N/A) CORONARY ARTERY BYPASS GRAFT times three      with left internal mammary artery and endoharvest of right leg greater saphenous vein (N/A) TRANSESOPHAGEAL ECHOCARDIOGRAM (TEE) (N/A) Subjective: No complaints, feels well  Objective: Vital signs in last 24 hours: Temp:  [97.3 F (36.3 C)-99.3 F (37.4 C)] 98.2 F (36.8 C) (06/12 0600) Pulse Rate:  [67-88] 77 (06/12 0600) Cardiac Rhythm: Atrial fibrillation (06/12 0400) Resp:  [10-27] 12 (06/12 0600) BP: (94-129)/(53-75) 129/71 (06/12 0444) SpO2:  [99 %-100 %] 100 % (06/12 0600) Arterial Line BP: (96-152)/(46-72) 119/50 (06/12 0600) FiO2 (%):  [40 %-50 %] 40 % (06/11 1804) Weight:  [92.5 kg] 92.5 kg (06/12 0500)  Hemodynamic parameters for last 24 hours: PAP: (23-55)/(7-34) 37/14 CO:  [3.7 L/min-5.4 L/min] 5.4 L/min CI:  [1.8 L/min/m2-2.6 L/min/m2] 2.6 L/min/m2  Intake/Output from previous day: 06/11 0701 - 06/12 0700 In: 4303.3 [I.V.:2085.5; Blood:515; IV Piggyback:1492.8] Out: 690 [Drains:50; Blood:400; Chest Tube:240] Intake/Output this shift: No intake/output data recorded.  General appearance: alert, cooperative and no distress Neurologic: intact Heart: regular rate and rhythm Lungs: diminished breath sounds bibasilar Abdomen: normal findings: soft, non-tender  Lab Results: Recent Labs    09/16/18 2000 09/16/18 2012 09/17/18 0414  WBC 10.1  --  10.9*  HGB 9.7* 9.9* 8.3*  HCT 29.2* 29.0* 25.0*  PLT 104*  --  96*   BMET:  Recent Labs    09/16/18 2000 09/16/18 2012 09/16/18 2227 09/17/18 0414  NA 137 134*  --  137  K 7.0* 6.8* 6.6* 5.9*  CL 103 101  --  100  CO2 23  --   --  26  GLUCOSE 194* 183*  --  77  BUN 37* 38*  --  39*  CREATININE 7.36* 7.10*  --  8.04*  CALCIUM 8.2*  --   --  8.5*    PT/INR:  Recent Labs    09/16/18 1400  LABPROT 18.7*  INR 1.6*   ABG    Component Value Date/Time   PHART 7.329 (L) 09/16/2018 1955   HCO3 25.2  09/16/2018 1955   TCO2 24 09/16/2018 2012   ACIDBASEDEF 1.0 09/16/2018 1955   O2SAT 99.0 09/16/2018 1955   CBG (last 3)  Recent Labs    09/16/18 2011 09/16/18 2327 09/17/18 0423  GLUCAP 169* 125* 24    Assessment/Plan: S/P Procedure(s) (LRB): REDO STERNOTOMY (N/A) CORONARY ARTERY BYPASS GRAFT times three      with left internal mammary artery and endoharvest of right leg greater saphenous vein (N/A) TRANSESOPHAGEAL ECHOCARDIOGRAM (TEE) (N/A) POD # 1 Looks great CV- on low dose neo. Filling pressures relatively low, good cardiac index  Chronic atrial fib- rate controlled  Mechanical AVR - restart coumadin  RESP- IS  RENAL- hyperkalemia- treated with D50 + insulin + kayexelate last night  HD per Nephrology  ENDO- CBG low after insulin for K  Anemia secondary to ABL in setting of anemia of chronic disease- Hct 25- follow  SCD + low dose lovenox until INR up  Dc chest tubes  Cardiac rehab   LOS: 4 days    Jeremy [name] 09/17/2018

## 2018-09-17 NOTE — Progress Notes (Signed)
Patient ID: Jeremy [name], male   DOB: [birth year], 63 y.o.   MRN: [id number] EVENING ROUNDS NOTE :     [address]       [place] [address]             [phone]                 1 Day Post-Op Procedure(s) (LRB): REDO STERNOTOMY (N/A) CORONARY ARTERY BYPASS GRAFT times three      with left internal mammary artery and endoharvest of right leg greater saphenous vein (N/A) TRANSESOPHAGEAL ECHOCARDIOGRAM (TEE) (N/A)  Total Length of Stay:  LOS: 4 days  BP 117/62   Pulse 80   Temp 98.3 F (36.8 C) (Oral)   Resp 15   Ht 5\' 11"  (1.803 m)   Wt 92 kg   SpO2 100%   BMI 28.29 kg/m   .Intake/Output      06/11 0701 - 06/12 0700 06/12 0701 - 06/13 0700   P.O.  240   I.V. (mL/kg) 2085.5 (22.5) 199.3 (2.2)   Blood 515    Other 210    IV Piggyback 1492.8    Total Intake(mL/kg) 4303.3 (46.5) 439.3 (4.8)   Urine (mL/kg/hr) 0 (0)    Drains 50    Other  500   Stool     Blood 400    Chest Tube 240    Total Output 690 500   Net +3613.3 -60.7          . sodium chloride Stopped (09/17/18 0822)  . sodium chloride Stopped (09/17/18 0819)  . sodium chloride 20 mL/hr at 09/17/18 1500  . cefUROXime (ZINACEF)  IV Stopped (09/17/18 0455)  . dexmedetomidine (PRECEDEX) IV infusion Stopped (09/16/18 1428)  . lactated ringers    . lactated ringers    . lactated ringers Stopped (09/17/18 0819)  . magnesium sulfate    . nitroGLYCERIN Stopped (09/16/18 1405)  . phenylephrine (NEO-SYNEPHRINE) Adult infusion 40 mcg/min (09/17/18 1500)     Lab Results  Component Value Date   WBC 10.9 (H) 09/17/2018   HGB 8.3 (L) 09/17/2018   HCT 25.0 (L) 09/17/2018   PLT 96 (L) 09/17/2018   GLUCOSE 77 09/17/2018   CHOL 176 08/08/2016   TRIG 158.0 (H) 08/08/2016   HDL 50.50 08/08/2016   LDLDIRECT 61.0 08/11/2017   LDLCALC 94 08/08/2016   ALT 28 09/13/2018   AST 36 09/13/2018   NA 137 09/17/2018   K 5.9 (H) 09/17/2018   CL 100 09/17/2018   CREATININE 8.04 (H) 09/17/2018   BUN 39 (H)  09/17/2018   CO2 26 09/17/2018   PSA 2.18 07/07/2016   INR 1.6 (H) 09/16/2018   HGBA1C 5.9 (H) 09/15/2018    INR 1.6 Has mechanical avr, restarted on coumadin bp low on dialysis improved now   Grace Isaac MD  [place] Office [phone] 09/17/2018 4:30 PM

## 2018-09-17 NOTE — Op Note (Signed)
NAME: [name], [name] TI:[id number] ACCOUNT [id number] DATE OF BIRTH:[birth year] FACILITY: [facility] LOCATION: [facility] PHYSICIAN:Aneya Daddona Chaya Jan, MD  OPERATIVE REPORT  DATE OF PROCEDURE:  09/16/2018  PREOPERATIVE DIAGNOSIS:  Severe 2-vessel coronary disease with impaired left ventricular function.  POSTOPERATIVE DIAGNOSIS:  Severe 2-vessel coronary disease with impaired left ventricular function.  PROCEDURE:  Redo median sternotomy, Coronary artery bypass grafting x 3  Left internal mammary artery to left anterior descending,  Saphenous vein graft to first diagonal,  Saphenous vein graft to posterior descending Endoscopic vein harvest right leg.  SURGEON:  Modesto Charon, MD  ASSISTANT:  Jadene Pierini, PA-C  ANESTHESIA:  General.  FINDINGS:  Transesophageal echocardiography shows an ejection fraction of approximately 35% with left ventricular hypertrophy.  Good function of the prosthetic valve with minimal AI secondary to washing jets, mild mitral regurgitation.  Severe adhesions  around the aorta and right atrial appendage, adhesions minimal otherwise.  Diagonal fair quality.  LAD and posterior descending good quality vessels at the site of anastomosis.  Good quality conduits.  Post-bypass TEE unchanged from pre-bypass study.  HISTORY OF PRESENT ILLNESS:  [name] is a 63 year old gentleman with a complicated medical history with a previous mechanical aortic valve replacement, end-stage renal failure with hemodialysis, hypertension, hyperlipidemia, chronic atrial  fibrillation, arthritis, anemia and obstructive sleep apnea.  He has been having chest pain since the fall of 2019, and it had been becoming more frequent and more severe.  He had a stress test which showed evidence of worsening ejection fraction with  stress.  He transferred his care to Dr. [name].  Cardiac catheterization showed severe 2-vessel disease involving the LAD and right coronary, and he was  referred for coronary artery bypass grafting.  I saw him in the office in March and recommended  surgical revascularization at that time, but he refused due to the coronavirus situation.  He now wishes to proceed.  His chest pain has improved with medical therapy, but he does still have exertional angina.  The indications, risks, benefits, and  alternatives were discussed in detail with the patient.  He understood and accepted the risks and agreed to proceed.  OPERATIVE NOTE:  The patient was brought to the preoperative holding area on 09/16/2018.  Anesthesiology placed a Swan-Ganz catheter and an arterial blood pressure monitor line.  He was taken to the operating room, anesthetized and intubated.  A Foley  catheter was placed.  Intravenous antibiotics were administered.  Transesophageal echocardiography was performed.  Findings as noted above.  The chest, abdomen and legs were prepped and draped in the usual sterile fashion.  A median sternotomy was performed.  An incision was made through the previous incision along the upper sternum and extended to the xiphoid process.  The transverse sternal wires were removed.  The vertical wire was left in place.  The sternum was opened  with an oscillating saw.  There were no complications with the sternotomy.  Simultaneously with the sternotomy, an incision was made in the medial aspect of the right leg just below the knee. The greater saphenous vein was harvested from the right leg  endoscopically.  Two thousand units of heparin was administered during the vessel harvest.  Each side of the sternum was elevated, and the underlying tissue was dissected off with cautery.  A Rul-tract retractor was placed and the left internal mammary  artery and was harvested using standard technique.  The mammary was a 2 mm good quality vessel.  It had excellent flow when  divided distally.  The vein graft was also of good quality.  After harvesting the conduits, the remainder of  the full heparin dose was given.  A sternal retractor was placed and was slowly opened over time.  Dissection was begun.  The pericardium was intact over the lower portion of the heart.  Near the right atrial  appendage and over the aorta, there was extensive scarring.  The pericardium was incised.  There were some adhesions within the pericardial space.  These were thin filmy adhesions which were taken down with sharp dissection.  A relative free space was  entered along the diaphragmatic surface.  The edge of the pericardium was dissected off of the heart bilaterally, and pericardial stay sutures were placed.  A plane was developed along the aorta, and the previous cannulation site was identified.  It was  elected to cannulate the aorta just distal to the previous cannulation site.  2-0 Ethibond pledgeted pursestring sutures were placed at that site.  The full heparin dose was administered. After confirming adequate anticoagulation with ACT measurement, the aorta was cannulated via the pursestring sutures.  The right atrial appendage was dissected out.  There was minimal bleeding with this.  A 2-0 Ethibond pursestring suture was placed, and a dual-stage venous cannula was placed via this pursestring  suture into the right atrium.  Cardiopulmonary bypass was initiated.  Flows were maintained per protocol.  The patient was cooled to 32 degrees Celsius.  The remainder of the heart was dissected out.  The coronary arteries were inspected and anastomotic  sites were chosen.  Of note, the posterior lateral branch was too small to graft.  The posterior descending was a good quality target.  There was no graftable lateral wall vessel.  All of the coronaries were heavily calcified proximally.  Sites were  selected for anastomosis.  The vein grafts were cut to length and prepared.  The left mammary was cut to length as well.  A foam pad was placed in the pericardium to insulate the heart.  A temperature probe was  placed in the myocardial septum.  A  cardioplegia cannula was placed in the ascending aorta.  The aorta was cross-clamped.  The left ventricle was emptied via the aortic root vent.  Cardiac arrest then was achieved with a combination of cold antegrade blood cardioplegia and topical iced  Saline.  1.5 L of cardioplegia was administered antegrade.  There was a rapid diastolic arrest, and there was septal cooling to 10 degrees Celsius.  A reversed saphenous vein graft was then was placed end-to-side to the first diagonal branch of the LAD.  This was an anterolateral branch.  It was heavily calcified proximally.  It was fair quality at the site of anastomosis but did accept a 1.5 mm probe  distally.  The vein was anastomosed end-to-side with a running 7-0 Prolene suture.  The probe passed easily proximally and distally at its completion.  Cardioplegia was administered.  There was good flow and good hemostasis.  The acute margin of the heart was retracted superiorly, exposing the posterior descending.  The posterior descending was a 2 mm good quality target at the site of anastomosis, although it did have heavy disease proximally and some disease distally as  well.  The vein graft was of good quality.  It was anastomosed end-to-side with a running 7-0 Prolene suture.  A probe passed easily proximally and distally.  At the completion of the anastomosis, there was good flow and good hemostasis  with cardioplegia  administration.  The left internal mammary artery was brought through a window in the pericardium.  The distal limb was bevelled.  It was anastomosed end-to-side to the LAD.  The LAD was grafted just beyond an area of heavy calcification and before the LAD became very  small in size.  It was a 1.5 mm good quality target at the site of anastomosis.  The mammary was a good quality conduit.  An end-to-side anastomosis was performed with a running 8-0 Prolene suture.  After completion of the anastomosis,  the bulldog clamp  was briefly removed to inspect for hemostasis.  Rapid septal rewarming was noted.  The bulldog clamp was replaced.  The mammary pedicle was tacked to the epicardial surface of the heart with 6-0 Prolene sutures.  Additional cardioplegia was administered down the vein grafts.  The vein grafts then were cut to length.  The cardioplegia cannula was removed from the ascending aorta.  The proximal vein graft anastomoses were performed to 4.5 mm punch aortotomies with  running 6-0 Prolene sutures.  At the completion of the second proximal anastomosis, the patient was placed in Trendelenburg position.  Lidocaine was administered.  The heart and the aortic root were deaired and the aortic crossclamp was removed.  The  total cross-clamp time was 63 minutes.  Air was aspirated from the vein grafts prior to removing the bulldog clamps.  The patient initially was in heart block and then developed a bradycardic rhythm.  While rewarming was completed, all proximal and  distal anastomoses were inspected for hemostasis.  Epicardial pacing wires were placed on the right ventricle and right atrium.  When the patient had rewarmed to a core temperature of 37 degrees Celsius, he was weaned from cardiopulmonary bypass on the  first attempt.  Total bypass time was 111 minutes.  He was on a dopamine infusion at 3 mcg/kg per minute, and DDD paced at 80 beats per minute at the time of separation from bypass.  The initial cardiac index was greater than 2 L/min per sq m, and he  remained hemodynamically stable throughout the post-bypass period.  Post-bypass transesophageal echocardiography was unchanged from the pre-bypass study.  It was noted that there was bleeding at the right groin incision.  A small incision was made there  to achieve hemostasis and then was closed in standard fashion.  A test dose of protamine was administered and was well tolerated.  The atrial and aortic cannulae were removed.  The remainder  of the protamine was administered without incident.  The chest  was irrigated with warm saline.  Hemostasis was achieved.  Chest tubes were placed in the pericardium, the anterior mediastinum, and the left pleural space via separate subcostal incisions.  The pericardium was reapproximated over the aortic root and  base of the heart with interrupted 3-0 silk sutures.  Over the aorta, there was an area where the right side of the pericardium had been disrupted, and the left side of the pericardium was tacked to the mediastinal fat in that area to cover the aorta and  the cannulation site.  The sternum was closed with a combination of single and double heavy gauge stainless steel wire wires.  The pectoralis fascia, subcutaneous tissue and skin were closed in standard fashion.  All sponge, needle and instrument counts  were correct at the end of the procedure.  The patient was taken from the operating room to the surgical intensive care unit, intubated and in good condition.  LN/NUANCE  D:09/16/2018 T:09/17/2018 JOB:[id number]

## 2018-09-18 ENCOUNTER — Inpatient Hospital Stay (HOSPITAL_COMMUNITY): Payer: Medicare Other

## 2018-09-18 LAB — GLUCOSE, CAPILLARY
Glucose-Capillary: 102 mg/dL — ABNORMAL HIGH (ref 70–99)
Glucose-Capillary: 110 mg/dL — ABNORMAL HIGH (ref 70–99)
Glucose-Capillary: 112 mg/dL — ABNORMAL HIGH (ref 70–99)
Glucose-Capillary: 118 mg/dL — ABNORMAL HIGH (ref 70–99)
Glucose-Capillary: 132 mg/dL — ABNORMAL HIGH (ref 70–99)
Glucose-Capillary: 140 mg/dL — ABNORMAL HIGH (ref 70–99)

## 2018-09-18 LAB — BASIC METABOLIC PANEL
Anion gap: 12 (ref 5–15)
BUN: 32 mg/dL — ABNORMAL HIGH (ref 8–23)
CO2: 26 mmol/L (ref 22–32)
Calcium: 7.8 mg/dL — ABNORMAL LOW (ref 8.9–10.3)
Chloride: 100 mmol/L (ref 98–111)
Creatinine, Ser: 6.28 mg/dL — ABNORMAL HIGH (ref 0.61–1.24)
GFR calc Af Amer: 10 mL/min — ABNORMAL LOW (ref >60)
GFR calc non Af Amer: 9 mL/min — ABNORMAL LOW (ref >60)
Glucose, Bld: 114 mg/dL — ABNORMAL HIGH (ref 70–99)
Potassium: 4.8 mmol/L (ref 3.5–5.1)
Sodium: 138 mmol/L (ref 135–145)

## 2018-09-18 LAB — CBC
HCT: 24.1 % — ABNORMAL LOW (ref 39.0–52.0)
Hemoglobin: 7.6 g/dL — ABNORMAL LOW (ref 13.0–17.0)
MCH: 34.4 pg — ABNORMAL HIGH (ref 26.0–34.0)
MCHC: 31.5 g/dL (ref 30.0–36.0)
MCV: 109 fL — ABNORMAL HIGH (ref 80.0–100.0)
Platelets: 83 10*3/uL — ABNORMAL LOW (ref 150–400)
RBC: 2.21 MIL/uL — ABNORMAL LOW (ref 4.22–5.81)
RDW: 19.1 % — ABNORMAL HIGH (ref 11.5–15.5)
WBC: 10.3 10*3/uL (ref 4.0–10.5)
nRBC: 0 % (ref 0.0–0.2)

## 2018-09-18 LAB — PROTIME-INR
INR: 1.8 — ABNORMAL HIGH (ref 0.8–1.2)
Prothrombin Time: 20.2 seconds — ABNORMAL HIGH (ref 11.4–15.2)

## 2018-09-18 NOTE — Progress Notes (Signed)
Patient ID: Jeremy [name], male   DOB: 03-23-1956, 63 y.o.   MRN: [id number] TCTS DAILY ICU PROGRESS NOTE                   [address]            [place] [address]          [phone]   2 Days Post-Op Procedure(s) (LRB): REDO STERNOTOMY (N/A) CORONARY ARTERY BYPASS GRAFT times three      with left internal mammary artery and endoharvest of right leg greater saphenous vein (N/A) TRANSESOPHAGEAL ECHOCARDIOGRAM (TEE) (N/A)  Total Length of Stay:  LOS: 5 days   Subjective: Large discrepancy between blood pressure cuff and A-line, on 30 drops of neo-.  Patient noted some lightheadedness when he sat up this morning to go walking.  Objective: Vital signs in last 24 hours: Temp:  [98.1 F (36.7 C)-98.6 F (37 C)] 98.1 F (36.7 C) (06/13 0420) Pulse Rate:  [71-91] 81 (06/13 0500) Cardiac Rhythm: Atrial fibrillation (06/12 2000) Resp:  [9-21] 12 (06/13 0500) BP: (71-158)/(34-84) 126/35 (06/13 0400) SpO2:  [91 %-100 %] 93 % (06/13 0500) Arterial Line BP: (93-161)/(40-57) 124/47 (06/13 0500) Weight:  [90.8 kg-92.5 kg] 90.8 kg (06/13 0500)  Filed Weights   09/17/18 0850 09/17/18 1215 09/18/18 0500  Weight: 92.5 kg 92 kg 90.8 kg    Weight change: 0 kg   Hemodynamic parameters for last 24 hours:    Intake/Output from previous day: 06/12 0701 - 06/13 0700 In: 1300.6 [P.O.:240; I.V.:860.6; IV Piggyback:200] Out: 570 [Chest Tube:70]  Intake/Output this shift: No intake/output data recorded.  Current Meds: Scheduled Meds: . acetaminophen  1,000 mg Oral Q6H   Or  . acetaminophen (TYLENOL) oral liquid 160 mg/5 mL  1,000 mg Per Tube Q6H  . allopurinol  300 mg Oral Daily  . amiodarone  200 mg Oral BID  . aspirin EC  81 mg Oral Daily  . bisacodyl  10 mg Oral Daily   Or  . bisacodyl  10 mg Rectal Daily  . Chlorhexidine Gluconate Cloth  6 each Topical Q0600  . Chlorhexidine Gluconate Cloth  6 each Topical Q0600  . cinacalcet  30 mg Oral Q supper  . docusate  sodium  200 mg Oral Daily  . enoxaparin (LOVENOX) injection  30 mg Subcutaneous QHS  . FLUoxetine  10 mg Oral Daily  . insulin aspart  0-24 Units Subcutaneous Q4H  . metoprolol tartrate  12.5 mg Oral BID   Or  . metoprolol tartrate  12.5 mg Per Tube BID  . multivitamin  1 tablet Oral QHS  . mupirocin ointment  1 application Nasal BID  . pantoprazole  40 mg Oral Daily  . pravastatin  40 mg Oral q1800  . predniSONE  5 mg Oral Q breakfast  . sodium chloride flush  3 mL Intravenous Q12H  . warfarin  1 mg Oral q1800  . Warfarin - Physician Dosing Inpatient   Does not apply q1800   Continuous Infusions: . sodium chloride Stopped (09/17/18 0822)  . sodium chloride Stopped (09/17/18 0819)  . sodium chloride 20 mL/hr at 09/17/18 1900  . dexmedetomidine (PRECEDEX) IV infusion Stopped (09/16/18 1428)  . lactated ringers    . lactated ringers    . lactated ringers Stopped (09/17/18 0819)  . magnesium sulfate    . nitroGLYCERIN Stopped (09/16/18 1405)  . phenylephrine (NEO-SYNEPHRINE) Adult infusion 20 mcg/min (09/18/18 0708)   PRN Meds:.sodium chloride, lactated ringers, metoprolol  tartrate, midazolam, morphine injection, ondansetron (ZOFRAN) IV, oxyCODONE, sodium chloride flush, traMADol  General appearance: alert and cooperative Neurologic: intact Heart: regular rate and rhythm, S1, S2 normal, no murmur, click, rub or gallop Lungs: diminished breath sounds bibasilar Abdomen: soft, non-tender; bowel sounds normal; no masses,  no organomegaly Extremities: Functioning graft left arm, A-line and right forearm Wound: Sternum stable  Lab Results: CBC: Recent Labs    09/17/18 1700 09/18/18 0407  WBC 10.3 10.3  HGB 7.7* 7.6*  HCT 23.3* 24.1*  PLT 86* 83*   BMET:  Recent Labs    09/17/18 1700 09/18/18 0407  NA 138 138  K 4.7 4.8  CL 102 100  CO2 27 26  GLUCOSE 131* 114*  BUN 24* 32*  CREATININE 5.46* 6.28*  CALCIUM 8.5* 7.8*    CMET: Lab Results  Component Value Date    WBC 10.3 09/18/2018   HGB 7.6 (L) 09/18/2018   HCT 24.1 (L) 09/18/2018   PLT 83 (L) 09/18/2018   GLUCOSE 114 (H) 09/18/2018   CHOL 176 08/08/2016   TRIG 158.0 (H) 08/08/2016   HDL 50.50 08/08/2016   LDLDIRECT 61.0 08/11/2017   LDLCALC 94 08/08/2016   ALT 28 09/13/2018   AST 36 09/13/2018   NA 138 09/18/2018   K 4.8 09/18/2018   CL 100 09/18/2018   CREATININE 6.28 (H) 09/18/2018   BUN 32 (H) 09/18/2018   CO2 26 09/18/2018   PSA 2.18 07/07/2016   INR 1.8 (H) 09/18/2018   HGBA1C 5.9 (H) 09/15/2018      PT/INR:  Recent Labs    09/18/18 0407  LABPROT 20.2*  INR 1.8*   Radiology: No results found.   Assessment/Plan: S/P Procedure(s) (LRB): REDO STERNOTOMY (N/A) CORONARY ARTERY BYPASS GRAFT times three      with left internal mammary artery and endoharvest of right leg greater saphenous vein (N/A) TRANSESOPHAGEAL ECHOCARDIOGRAM (TEE) (N/A) Mobilize Coumadin has been resumed, INR 1.8 with mechanical valve in place Remains on neo-for blood pressure support but significant discrepancy between A-line and blood pressure cuff on leg Thrombocytopenia stable Potassium 4.8 today    Grace Isaac 09/18/2018 8:30 AM

## 2018-09-18 NOTE — Progress Notes (Signed)
[facility] Progress Note  Subjective: soft BP's on HD yest in 90's, UF -500 cc, wt's down 90.8 kg, no SOB  Vitals:   09/18/18 1030 09/18/18 1045 09/18/18 1046 09/18/18 1103  BP:   (!) 104/48   Pulse:   85   Resp: 11 14    Temp:    98 F (36.7 C)  TempSrc:    Oral  SpO2:      Weight:      Height:        Inpatient medications: . acetaminophen  1,000 mg Oral Q6H   Or  . acetaminophen (TYLENOL) oral liquid 160 mg/5 mL  1,000 mg Per Tube Q6H  . allopurinol  300 mg Oral Daily  . amiodarone  200 mg Oral BID  . aspirin EC  81 mg Oral Daily  . bisacodyl  10 mg Oral Daily   Or  . bisacodyl  10 mg Rectal Daily  . Chlorhexidine Gluconate Cloth  6 each Topical Q0600  . Chlorhexidine Gluconate Cloth  6 each Topical Q0600  . cinacalcet  30 mg Oral Q supper  . docusate sodium  200 mg Oral Daily  . enoxaparin (LOVENOX) injection  30 mg Subcutaneous QHS  . FLUoxetine  10 mg Oral Daily  . insulin aspart  0-24 Units Subcutaneous Q4H  . metoprolol tartrate  12.5 mg Oral BID   Or  . metoprolol tartrate  12.5 mg Per Tube BID  . multivitamin  1 tablet Oral QHS  . mupirocin ointment  1 application Nasal BID  . pantoprazole  40 mg Oral Daily  . pravastatin  40 mg Oral q1800  . predniSONE  5 mg Oral Q breakfast  . sodium chloride flush  3 mL Intravenous Q12H  . warfarin  1 mg Oral q1800  . Warfarin - Physician Dosing Inpatient   Does not apply q1800   . sodium chloride Stopped (09/17/18 2353)  . sodium chloride Stopped (09/17/18 0819)  . sodium chloride 20 mL/hr at 09/18/18 0800  . dexmedetomidine (PRECEDEX) IV infusion Stopped (09/16/18 1428)  . lactated ringers    . lactated ringers    . lactated ringers Stopped (09/17/18 0819)  . magnesium sulfate    . nitroGLYCERIN Stopped (09/16/18 1405)  . phenylephrine (NEO-SYNEPHRINE) Adult infusion 30 mcg/min (09/18/18 0945)   sodium chloride, lactated ringers, metoprolol tartrate, midazolam, morphine injection, ondansetron  (ZOFRAN) IV, oxyCODONE, sodium chloride flush, traMADol    Exam: General: awakens easily, no distress Heart: RRR Lungs: grossly clear anteriorly Abdomen: soft nontender Extremities: +mild -mod edema of ext Dialysis Access: left lower AVF + bruit  Dialysis Orders:  MWF ([facility]) 3h 57min 450/800 84.5kg Heparin none R AVF  Retracrit 6000 no Fe or VDRA Recent hgb 10.5 on renvela 5 and sensipar 30  Assessment/Plan: 1. S/p 2 vessel CABG -stable, per Dr. Roxan Hockey 2. ESRD - usual HD MWF.  HD again today, min UF 3. Anemia ckd/ abl - hgb 10.7. Hb 7.6 today. SP 1 unit PRBC 6/11. Not sure why he is on Retacrit instead of Mircera at outpatient unit. 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 BP /volume - chronic low BP - on midodrine 10 pre HD and optional mid treatment. Up 6kg, stable resp status 6. Nutrition - per primary team 7. Afib - amio, warfarin at home    [facility] 09/18/2018, 11:33 AM  Iron/TIBC/Ferritin/ %Sat    Component Value Date/Time   IRON 86 04/08/2017 1210  TIBC 234 (L) 04/08/2017 1210   FERRITIN 1,239 (H) 04/08/2017 1210   IRONPCTSAT 37 04/08/2017 1210   Recent Labs  Lab 09/13/18 1904  09/18/18 0407  NA 139   < > 138  K 4.4   < > 4.8  CL 95*   < > 100  CO2 30   < > 26  GLUCOSE 106*   < > 114*  BUN 21   < > 32*  CREATININE 6.24*   < > 6.28*  CALCIUM 8.7*   < > 7.8*  ALBUMIN 3.7  --   --   INR 1.7*   < > 1.8*   < > = values in this interval not displayed.   Recent Labs  Lab 09/13/18 1904  AST 36  ALT 28  ALKPHOS 200*  BILITOT 0.6  PROT 7.3   Recent Labs  Lab 09/18/18 0407  WBC 10.3  HGB 7.6*  HCT 24.1*  PLT 83*

## 2018-09-18 NOTE — Progress Notes (Signed)
Patient ID: Jeremy [name], male   DOB: 1955-10-29, 63 y.o.   MRN: [id number] EVENING ROUNDS NOTE :     [address]       [place] [address]             [phone]                 2 Days Post-Op Procedure(s) (LRB): REDO STERNOTOMY (N/A) CORONARY ARTERY BYPASS GRAFT times three      with left internal mammary artery and endoharvest of right leg greater saphenous vein (N/A) TRANSESOPHAGEAL ECHOCARDIOGRAM (TEE) (N/A)  Total Length of Stay:  LOS: 5 days  BP 108/67   Pulse 86   Temp 98.3 F (36.8 C) (Oral)   Resp 17   Ht 5\' 11"  (1.803 m)   Wt 90.8 kg   SpO2 99%   BMI 27.92 kg/m   .Intake/Output      06/12 0701 - 06/13 0700 06/13 0701 - 06/14 0700   P.O. 240 360   I.V. (mL/kg) 860.6 (9.5) 469 (5.2)   Blood     Other     IV Piggyback 200    Total Intake(mL/kg) 1300.6 (14.3) 829 (9.1)   Urine (mL/kg/hr)     Drains     Other 500    Blood     Chest Tube 70    Total Output 570    Net +730.6 +829          . sodium chloride Stopped (09/17/18 3833)  . sodium chloride Stopped (09/17/18 0819)  . sodium chloride 20 mL/hr at 09/18/18 1700  . dexmedetomidine (PRECEDEX) IV infusion Stopped (09/16/18 1428)  . lactated ringers    . lactated ringers    . lactated ringers Stopped (09/17/18 0819)  . magnesium sulfate    . nitroGLYCERIN Stopped (09/16/18 1405)  . phenylephrine (NEO-SYNEPHRINE) Adult infusion 25 mcg/min (09/18/18 1700)     Lab Results  Component Value Date   WBC 10.3 09/18/2018   HGB 7.6 (L) 09/18/2018   HCT 24.1 (L) 09/18/2018   PLT 83 (L) 09/18/2018   GLUCOSE 114 (H) 09/18/2018   CHOL 176 08/08/2016   TRIG 158.0 (H) 08/08/2016   HDL 50.50 08/08/2016   LDLDIRECT 61.0 08/11/2017   LDLCALC 94 08/08/2016   ALT 28 09/13/2018   AST 36 09/13/2018   NA 138 09/18/2018   K 4.8 09/18/2018   CL 100 09/18/2018   CREATININE 6.28 (H) 09/18/2018   BUN 32 (H) 09/18/2018   CO2 26 09/18/2018   PSA 2.18 07/07/2016   INR 1.8 (H) 09/18/2018   HGBA1C 5.9  (H) 09/15/2018   Walked today, bp still labile To start dialysis   Grace Isaac MD  Beeper [phone] Office [phone] 09/18/2018 6:44 PM

## 2018-09-19 LAB — GLUCOSE, CAPILLARY
Glucose-Capillary: 90 mg/dL (ref 70–99)
Glucose-Capillary: 134 mg/dL — ABNORMAL HIGH (ref 70–99)
Glucose-Capillary: 114 mg/dL — ABNORMAL HIGH (ref 70–99)
Glucose-Capillary: 148 mg/dL — ABNORMAL HIGH (ref 70–99)
Glucose-Capillary: 121 mg/dL — ABNORMAL HIGH (ref 70–99)
Glucose-Capillary: 91 mg/dL (ref 70–99)

## 2018-09-19 LAB — BASIC METABOLIC PANEL
Anion gap: 12 (ref 5–15)
BUN: 24 mg/dL — ABNORMAL HIGH (ref 8–23)
CO2: 26 mmol/L (ref 22–32)
Calcium: 7.8 mg/dL — ABNORMAL LOW (ref 8.9–10.3)
Chloride: 100 mmol/L (ref 98–111)
Creatinine, Ser: 5.58 mg/dL — ABNORMAL HIGH (ref 0.61–1.24)
GFR calc Af Amer: 12 mL/min — ABNORMAL LOW (ref >60)
GFR calc non Af Amer: 10 mL/min — ABNORMAL LOW (ref >60)
Glucose, Bld: 104 mg/dL — ABNORMAL HIGH (ref 70–99)
Potassium: 4 mmol/L (ref 3.5–5.1)
Sodium: 138 mmol/L (ref 135–145)

## 2018-09-19 LAB — CBC
HCT: 23.7 % — ABNORMAL LOW (ref 39.0–52.0)
Hemoglobin: 7.5 g/dL — ABNORMAL LOW (ref 13.0–17.0)
MCH: 34.2 pg — ABNORMAL HIGH (ref 26.0–34.0)
MCHC: 31.6 g/dL (ref 30.0–36.0)
MCV: 108.2 fL — ABNORMAL HIGH (ref 80.0–100.0)
Platelets: 78 10*3/uL — ABNORMAL LOW (ref 150–400)
RBC: 2.19 MIL/uL — ABNORMAL LOW (ref 4.22–5.81)
RDW: 18.4 % — ABNORMAL HIGH (ref 11.5–15.5)
WBC: 8.9 10*3/uL (ref 4.0–10.5)
nRBC: 0.3 % — ABNORMAL HIGH (ref 0.0–0.2)

## 2018-09-19 LAB — PROTIME-INR
INR: 2.4 — ABNORMAL HIGH (ref 0.8–1.2)
Prothrombin Time: 25.5 seconds — ABNORMAL HIGH (ref 11.4–15.2)

## 2018-09-19 MED ORDER — CHLORHEXIDINE GLUCONATE CLOTH 2 % EX PADS
6.0000 | MEDICATED_PAD | Freq: Every day | CUTANEOUS | Status: DC
  Administered 2018-09-19 – 2018-09-21 (×3): 6 via TOPICAL

## 2018-09-19 NOTE — Progress Notes (Signed)
Patient ID: ROBERTS BON, male   DOB: 1956/02/02, 63 y.o.   MRN: [id number] EVENING ROUNDS NOTE :     [address]       [place] [address]             [phone]                 3 Days Post-Op Procedure(s) (LRB): REDO STERNOTOMY (N/A) CORONARY ARTERY BYPASS GRAFT times three      with left internal mammary artery and endoharvest of right leg greater saphenous vein (N/A) TRANSESOPHAGEAL ECHOCARDIOGRAM (TEE) (N/A)  Total Length of Stay:  LOS: 6 days  BP (!) 80/69 (BP Location: Right Arm)   Pulse 97   Temp 98.8 F (37.1 C) (Oral)   Resp 15   Ht 5\' 11"  (1.803 m)   Wt 92.2 kg   SpO2 96%   BMI 28.35 kg/m   .Intake/Output      06/13 0701 - 06/14 0700 06/14 0701 - 06/15 0700   P.O. 360 360   I.V. (mL/kg) 1063 (11.5) 494.4 (5.4)   IV Piggyback     Total Intake(mL/kg) 1423 (15.4) 854.4 (9.3)   Urine (mL/kg/hr)  0 (0)   Other 1000    Stool  0   Chest Tube     Total Output 1000 0   Net +423 +854.4        Stool Occurrence  1 x     . sodium chloride Stopped (09/17/18 0822)  . sodium chloride Stopped (09/17/18 0819)  . sodium chloride 20 mL/hr at 09/19/18 1600  . dexmedetomidine (PRECEDEX) IV infusion Stopped (09/16/18 1428)  . lactated ringers    . lactated ringers    . lactated ringers Stopped (09/17/18 0819)  . magnesium sulfate    . nitroGLYCERIN Stopped (09/16/18 1405)  . phenylephrine (NEO-SYNEPHRINE) Adult infusion 15 mcg/min (09/19/18 1600)     Lab Results  Component Value Date   WBC 8.9 09/19/2018   HGB 7.5 (L) 09/19/2018   HCT 23.7 (L) 09/19/2018   PLT 78 (L) 09/19/2018   GLUCOSE 104 (H) 09/19/2018   CHOL 176 08/08/2016   TRIG 158.0 (H) 08/08/2016   HDL 50.50 08/08/2016   LDLDIRECT 61.0 08/11/2017   LDLCALC 94 08/08/2016   ALT 28 09/13/2018   AST 36 09/13/2018   NA 138 09/19/2018   K 4.0 09/19/2018   CL 100 09/19/2018   CREATININE 5.58 (H) 09/19/2018   BUN 24 (H) 09/19/2018   CO2 26 09/19/2018   PSA 2.18 07/07/2016   INR 2.4 (H)  09/19/2018   HGBA1C 5.9 (H) 09/15/2018    Feels better today Still on low dose neo  Grace Isaac MD  Beeper [phone] Office [phone] 09/19/2018 6:33 PM

## 2018-09-19 NOTE — Progress Notes (Signed)
[facility] Progress Note  Subjective: up in chair, UF 1 L off on HD last night.  No SOB.  Feeling swollen. 92kg this am. BP's up, still on pressors.  Got one dose po metoprolol 12.5 last night for afib RVR.  Today heart rates are ok < 100.    Vitals:   09/19/18 0915 09/19/18 0930 09/19/18 0945 09/19/18 1000  BP: (!) 107/52 (!) 109/59 (!) 111/55 119/71  Pulse: 76 75 77 76  Resp: 14 14 14 13   Temp:      TempSrc:      SpO2: 93% 93% 93% 92%  Weight:      Height:        Inpatient medications: . acetaminophen  1,000 mg Oral Q6H   Or  . acetaminophen (TYLENOL) oral liquid 160 mg/5 mL  1,000 mg Per Tube Q6H  . allopurinol  300 mg Oral Daily  . amiodarone  200 mg Oral BID  . aspirin EC  81 mg Oral Daily  . bisacodyl  10 mg Oral Daily   Or  . bisacodyl  10 mg Rectal Daily  . Chlorhexidine Gluconate Cloth  6 each Topical Q0600  . Chlorhexidine Gluconate Cloth  6 each Topical Q0600  . cinacalcet  30 mg Oral Q supper  . docusate sodium  200 mg Oral Daily  . FLUoxetine  10 mg Oral Daily  . insulin aspart  0-24 Units Subcutaneous Q4H  . metoprolol tartrate  12.5 mg Oral BID   Or  . metoprolol tartrate  12.5 mg Per Tube BID  . multivitamin  1 tablet Oral QHS  . mupirocin ointment  1 application Nasal BID  . pantoprazole  40 mg Oral Daily  . pravastatin  40 mg Oral q1800  . predniSONE  5 mg Oral Q breakfast  . sodium chloride flush  3 mL Intravenous Q12H  . warfarin  1 mg Oral q1800  . Warfarin - Physician Dosing Inpatient   Does not apply q1800   . sodium chloride Stopped (09/17/18 2637)  . sodium chloride Stopped (09/17/18 0819)  . sodium chloride 20 mL/hr at 09/19/18 0800  . dexmedetomidine (PRECEDEX) IV infusion Stopped (09/16/18 1428)  . lactated ringers    . lactated ringers    . lactated ringers Stopped (09/17/18 0819)  . magnesium sulfate    . nitroGLYCERIN Stopped (09/16/18 1405)  . phenylephrine (NEO-SYNEPHRINE) Adult infusion 45 mcg/min (09/19/18 0800)    sodium chloride, lactated ringers, metoprolol tartrate, midazolam, morphine injection, ondansetron (ZOFRAN) IV, oxyCODONE, sodium chloride flush, traMADol    Exam: General: awakens easily, no distress Heart: RRR Lungs: grossly clear ant and post to bases Abdomen: soft nontender Extremities: +mild -mod edema of ext Dialysis Access: left lower AVF + bruit  Dialysis Orders: [place] MWF ([place]) 3h 70min 450/800 84.5kg Heparin none R AVF  Retracrit 6000 no Fe or VDRA Recent hgb 10.5 on renvela 5 and sensipar 30  Assessment/Plan: 1. S/p 2 vessel CABG -stable, per Dr. Roxan Hockey 2. ESRD - usual HD MWF.  Stable resp, plan HD tomorrow, should be able get some volume off w HD tomorrow.  3. Anemia ckd/ abl - Hb 7.5 today. SP 1 unit PRBC 6/11.  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 BP /volume - chronic low BP - on midodrine 10 pre HD and optional mid treatment. Up 6- 8 kg, stable resp status 6. Nutrition - per primary team 7. Afib - amio, warfarin at home    [facility] 09/19/2018, 10:18 AM  Iron/TIBC/Ferritin/ %Sat    Component Value Date/Time   IRON 86 04/08/2017 1210   TIBC 234 (L) 04/08/2017 1210   FERRITIN 1,239 (H) 04/08/2017 1210   IRONPCTSAT 37 04/08/2017 1210   Recent Labs  Lab 09/13/18 1904  09/19/18 0416  NA 139   < > 138  K 4.4   < > 4.0  CL 95*   < > 100  CO2 30   < > 26  GLUCOSE 106*   < > 104*  BUN 21   < > 24*  CREATININE 6.24*   < > 5.58*  CALCIUM 8.7*   < > 7.8*  ALBUMIN 3.7  --   --   INR 1.7*   < > 2.4*   < > = values in this interval not displayed.   Recent Labs  Lab 09/13/18 1904  AST 36  ALT 28  ALKPHOS 200*  BILITOT 0.6  PROT 7.3   Recent Labs  Lab 09/19/18 0416  WBC 8.9  HGB 7.5*  HCT 23.7*  PLT 78*

## 2018-09-19 NOTE — Progress Notes (Addendum)
Patient ID: Jeremy [name], male   DOB: 1955-12-12, 63 y.o.   MRN: [id number] [facility] DAILY ICU PROGRESS NOTE                   [address]            [place] [address]          [phone]   3 Days Post-Op Procedure(s) (LRB): REDO STERNOTOMY (N/A) CORONARY ARTERY BYPASS GRAFT times three      with left internal mammary artery and endoharvest of right leg greater saphenous vein (N/A) TRANSESOPHAGEAL ECHOCARDIOGRAM (TEE) (N/A)  Total Length of Stay:  LOS: 6 days   Subjective: Patient awake alert neurologically intact up in chair, tolerated 2 and half hours of dialysis last night Objective: Vital signs in last 24 hours: Temp:  [97.9 F (36.6 C)-99 F (37.2 C)] 98.4 F (36.9 C) (06/14 0740) Pulse Rate:  [68-104] 75 (06/14 0900) Cardiac Rhythm: Atrial fibrillation (06/14 0800) Resp:  [10-30] 14 (06/14 0900) BP: (84-126)/(48-80) 104/66 (06/14 0900) SpO2:  [91 %-100 %] 93 % (06/14 0900) Arterial Line BP: (91-134)/(41-87) 108/58 (06/14 0900) Weight:  [90 kg-92.2 kg] 92.2 kg (06/14 0500)  Filed Weights   09/18/18 2017 09/18/18 2257 09/19/18 0500  Weight: 90.8 kg 90 kg 92.2 kg    Weight change: -1.7 kg   Hemodynamic parameters for last 24 hours:    Intake/Output from previous day: 06/13 0701 - 06/14 0700 In: 1423 [P.O.:360; I.V.:1063] Out: 1000   Intake/Output this shift: Total I/O In: 107.6 [I.V.:107.6] Out: 0   Current Meds: Scheduled Meds: . acetaminophen  1,000 mg Oral Q6H   Or  . acetaminophen (TYLENOL) oral liquid 160 mg/5 mL  1,000 mg Per Tube Q6H  . allopurinol  300 mg Oral Daily  . amiodarone  200 mg Oral BID  . aspirin EC  81 mg Oral Daily  . bisacodyl  10 mg Oral Daily   Or  . bisacodyl  10 mg Rectal Daily  . Chlorhexidine Gluconate Cloth  6 each Topical Q0600  . Chlorhexidine Gluconate Cloth  6 each Topical Q0600  . cinacalcet  30 mg Oral Q supper  . docusate sodium  200 mg Oral Daily  . enoxaparin (LOVENOX) injection  30 mg  Subcutaneous QHS  . FLUoxetine  10 mg Oral Daily  . insulin aspart  0-24 Units Subcutaneous Q4H  . metoprolol tartrate  12.5 mg Oral BID   Or  . metoprolol tartrate  12.5 mg Per Tube BID  . multivitamin  1 tablet Oral QHS  . mupirocin ointment  1 application Nasal BID  . pantoprazole  40 mg Oral Daily  . pravastatin  40 mg Oral q1800  . predniSONE  5 mg Oral Q breakfast  . sodium chloride flush  3 mL Intravenous Q12H  . warfarin  1 mg Oral q1800  . Warfarin - Physician Dosing Inpatient   Does not apply q1800   Continuous Infusions: . sodium chloride Stopped (09/17/18 0822)  . sodium chloride Stopped (09/17/18 0819)  . sodium chloride 20 mL/hr at 09/19/18 0800  . dexmedetomidine (PRECEDEX) IV infusion Stopped (09/16/18 1428)  . lactated ringers    . lactated ringers    . lactated ringers Stopped (09/17/18 0819)  . magnesium sulfate    . nitroGLYCERIN Stopped (09/16/18 1405)  . phenylephrine (NEO-SYNEPHRINE) Adult infusion 45 mcg/min (09/19/18 0800)   PRN Meds:.sodium chloride, lactated ringers, metoprolol tartrate, midazolam, morphine injection, ondansetron (ZOFRAN) IV, oxyCODONE, sodium chloride  flush, traMADol  General appearance: alert and cooperative Neurologic: intact Heart: regular rate and rhythm, S1, S2 normal, no murmur, click, rub or gallop Lungs: diminished breath sounds bibasilar Abdomen: soft, non-tender; bowel sounds normal; no masses,  no organomegaly Extremities: extremities normal, atraumatic, no cyanosis or edema and Homans sign is negative, no sign of DVT Wound: Sternum stable  Lab Results: CBC: Recent Labs    09/18/18 0407 09/19/18 0416  WBC 10.3 8.9  HGB 7.6* 7.5*  HCT 24.1* 23.7*  PLT 83* 78*   BMET:  Recent Labs    09/18/18 0407 09/19/18 0416  NA 138 138  K 4.8 4.0  CL 100 100  CO2 26 26  GLUCOSE 114* 104*  BUN 32* 24*  CREATININE 6.28* 5.58*  CALCIUM 7.8* 7.8*    CMET: Lab Results  Component Value Date   WBC 8.9 09/19/2018   HGB  7.5 (L) 09/19/2018   HCT 23.7 (L) 09/19/2018   PLT 78 (L) 09/19/2018   GLUCOSE 104 (H) 09/19/2018   CHOL 176 08/08/2016   TRIG 158.0 (H) 08/08/2016   HDL 50.50 08/08/2016   LDLDIRECT 61.0 08/11/2017   LDLCALC 94 08/08/2016   ALT 28 09/13/2018   AST 36 09/13/2018   NA 138 09/19/2018   K 4.0 09/19/2018   CL 100 09/19/2018   CREATININE 5.58 (H) 09/19/2018   BUN 24 (H) 09/19/2018   CO2 26 09/19/2018   PSA 2.18 07/07/2016   INR 2.4 (H) 09/19/2018   HGBA1C 5.9 (H) 09/15/2018      PT/INR:  Recent Labs    09/19/18 0416  LABPROT 25.5*  INR 2.4*   Radiology: No results found.   Assessment/Plan: S/P Procedure(s) (LRB): REDO STERNOTOMY (N/A) CORONARY ARTERY BYPASS GRAFT times three      with left internal mammary artery and endoharvest of right leg greater saphenous vein (N/A) TRANSESOPHAGEAL ECHOCARDIOGRAM (TEE) (N/A) Mobilize Dialysis planned for tomorrow Remains on neo-for blood pressure DC A-line wean neo-off as appropriate Hold Lopressor for low blood pressure Expected Acute  Blood - loss Anemia- continue to monitor  Back on Coumadin INR 2.4 today  Grace Isaac 09/19/2018 9:33 AM

## 2018-09-20 ENCOUNTER — Inpatient Hospital Stay (HOSPITAL_COMMUNITY): Payer: Medicare Other

## 2018-09-20 DIAGNOSIS — I5023 Acute on chronic systolic (congestive) heart failure: Secondary | ICD-10-CM

## 2018-09-20 DIAGNOSIS — I4891 Unspecified atrial fibrillation: Secondary | ICD-10-CM

## 2018-09-20 LAB — CBC
HCT: 21.1 % — ABNORMAL LOW (ref 39.0–52.0)
Hemoglobin: 6.6 g/dL — CL (ref 13.0–17.0)
MCH: 34.4 pg — ABNORMAL HIGH (ref 26.0–34.0)
MCHC: 31.3 g/dL (ref 30.0–36.0)
MCV: 109.9 fL — ABNORMAL HIGH (ref 80.0–100.0)
Platelets: 86 10*3/uL — ABNORMAL LOW (ref 150–400)
RBC: 1.92 MIL/uL — ABNORMAL LOW (ref 4.22–5.81)
RDW: 18 % — ABNORMAL HIGH (ref 11.5–15.5)
WBC: 6 10*3/uL (ref 4.0–10.5)
nRBC: 0.8 % — ABNORMAL HIGH (ref 0.0–0.2)

## 2018-09-20 LAB — GLUCOSE, CAPILLARY
Glucose-Capillary: 92 mg/dL (ref 70–99)
Glucose-Capillary: 109 mg/dL — ABNORMAL HIGH (ref 70–99)
Glucose-Capillary: 94 mg/dL (ref 70–99)
Glucose-Capillary: 230 mg/dL — ABNORMAL HIGH (ref 70–99)
Glucose-Capillary: 84 mg/dL (ref 70–99)

## 2018-09-20 LAB — BASIC METABOLIC PANEL WITH GFR
Anion gap: 13 (ref 5–15)
BUN: 36 mg/dL — ABNORMAL HIGH (ref 8–23)
CO2: 25 mmol/L (ref 22–32)
Calcium: 7.4 mg/dL — ABNORMAL LOW (ref 8.9–10.3)
Chloride: 101 mmol/L (ref 98–111)
Creatinine, Ser: 7.55 mg/dL — ABNORMAL HIGH (ref 0.61–1.24)
GFR calc Af Amer: 8 mL/min — ABNORMAL LOW
GFR calc non Af Amer: 7 mL/min — ABNORMAL LOW
Glucose, Bld: 97 mg/dL (ref 70–99)
Potassium: 3.6 mmol/L (ref 3.5–5.1)
Sodium: 139 mmol/L (ref 135–145)

## 2018-09-20 LAB — PREPARE RBC (CROSSMATCH)

## 2018-09-20 LAB — PROTIME-INR
INR: 2.4 — ABNORMAL HIGH (ref 0.8–1.2)
Prothrombin Time: 25.8 seconds — ABNORMAL HIGH (ref 11.4–15.2)

## 2018-09-20 LAB — PHOSPHORUS: Phosphorus: 4.3 mg/dL (ref 2.5–4.6)

## 2018-09-20 MED ORDER — MIDODRINE HCL 5 MG PO TABS
10.0000 mg | ORAL_TABLET | ORAL | Status: DC
  Administered 2018-09-20 – 2018-09-22 (×2): 10 mg via ORAL
  Filled 2018-09-20 (×3): qty 2

## 2018-09-20 MED ORDER — DARBEPOETIN ALFA 100 MCG/0.5ML IJ SOSY
PREFILLED_SYRINGE | INTRAMUSCULAR | Status: AC
  Administered 2018-09-20: 17:00:00 100 ug via INTRAVENOUS
  Filled 2018-09-20: qty 0.5

## 2018-09-20 MED ORDER — DARBEPOETIN ALFA 100 MCG/0.5ML IJ SOSY
100.0000 ug | PREFILLED_SYRINGE | INTRAMUSCULAR | Status: DC
  Administered 2018-09-20: 17:00:00 100 ug via INTRAVENOUS

## 2018-09-20 MED ORDER — SODIUM CHLORIDE 0.9% IV SOLUTION: Freq: Once | INTRAVENOUS | Status: DC

## 2018-09-20 MED ORDER — CHLORHEXIDINE GLUCONATE CLOTH 2 % EX PADS
6.0000 | MEDICATED_PAD | Freq: Every day | CUTANEOUS | Status: DC
  Administered 2018-09-20 – 2018-09-21 (×2): 6 via TOPICAL

## 2018-09-20 MED FILL — Electrolyte-R (PH 7.4) Solution: INTRAVENOUS | Qty: 4000 | Status: AC

## 2018-09-20 MED FILL — Sodium Bicarbonate IV Soln 8.4%: INTRAVENOUS | Qty: 50 | Status: AC

## 2018-09-20 MED FILL — Lidocaine HCl Local Soln Prefilled Syringe 100 MG/5ML (2%): INTRAMUSCULAR | Qty: 5 | Status: AC

## 2018-09-20 MED FILL — Sodium Chloride IV Soln 0.9%: INTRAVENOUS | Qty: 3000 | Status: AC

## 2018-09-20 MED FILL — Mannitol IV Soln 20%: INTRAVENOUS | Qty: 500 | Status: AC

## 2018-09-20 NOTE — Discharge Instructions (Addendum)
Why was Coumadin prescribed for you? Coumadin was prescribed for you because you have a blood clot or a medical condition that can cause an increased risk of forming blood clots. Blood clots can cause serious health problems by blocking the flow of blood to the heart, lung, or brain. Coumadin can prevent harmful blood clots from forming. As a reminder your indication for Coumadin is:   Stroke Prevention Because Of Atrial Fibrillation  What test will check on my response to Coumadin? While on Coumadin (warfarin) you will need to have an INR test regularly to ensure that your dose is keeping you in the desired range. The INR (international normalized ratio) number is calculated from the result of the laboratory test called prothrombin time (PT).  If an INR APPOINTMENT HAS NOT ALREADY BEEN MADE FOR YOU please schedule an appointment to have this lab work done by your health care provider within 7 days. Your INR goal is usually a number between:  2 to 3 or your provider may give you a more narrow range like 2-2.5.  Ask your health care provider during an office visit what your goal INR is.  What  do you need to  know  About  COUMADIN? Take Coumadin (warfarin) exactly as prescribed by your healthcare provider about the same time each day.  DO NOT stop taking without talking to the doctor who prescribed the medication.  Stopping without other blood clot prevention medication to take the place of Coumadin may increase your risk of developing a new clot or stroke.  Get refills before you run out.  What do you do if you miss a dose? If you miss a dose, take it as soon as you remember on the same day then continue your regularly scheduled regimen the next day.  Do not take two doses of Coumadin at the same time.  Important Safety Information A possible side effect of Coumadin (Warfarin) is an increased risk of bleeding. You should call your healthcare provider right away if you experience any of the  following: ? Bleeding from an injury or your nose that does not stop. ? Unusual colored urine (red or dark brown) or unusual colored stools (red or black). ? Unusual bruising for unknown reasons. ? A serious fall or if you hit your head (even if there is no bleeding).  Some foods or medicines interact with Coumadin (warfarin) and might alter your response to warfarin. To help avoid this: ? Eat a balanced diet, maintaining a consistent amount of Vitamin K. ? Notify your provider about major diet changes you plan to make. ? Avoid alcohol or limit your intake to 1 drink for women and 2 drinks for men per day. (1 drink is 5 oz. wine, 12 oz. beer, or 1.5 oz. liquor.)  Make sure that ANY health care provider who prescribes medication for you knows that you are taking Coumadin (warfarin).  Also make sure the healthcare provider who is monitoring your Coumadin knows when you have started a new medication including herbals and non-prescription products.  Coumadin (Warfarin)  Major Drug Interactions  Increased Warfarin Effect Decreased Warfarin Effect  Alcohol (large quantities) Antibiotics (esp. Septra/Bactrim, Flagyl, Cipro) Amiodarone (Cordarone) Aspirin (ASA) Cimetidine (Tagamet) Megestrol (Megace) NSAIDs (ibuprofen, naproxen, etc.) Piroxicam (Feldene) Propafenone (Rythmol SR) Propranolol (Inderal) Isoniazid (INH) Posaconazole (Noxafil) Barbiturates (Phenobarbital) Carbamazepine (Tegretol) Chlordiazepoxide (Librium) Cholestyramine (Questran) Griseofulvin Oral Contraceptives Rifampin Sucralfate (Carafate) Vitamin K   Coumadin (Warfarin) Major Herbal Interactions  Increased Warfarin Effect Decreased Warfarin Effect  Garlic  Ginseng Ginkgo biloba Coenzyme Q10 Green tea St. Johns wort    Coumadin (Warfarin) FOOD Interactions  Eat a consistent number of servings per week of foods HIGH in Vitamin K (1 serving =  cup)  Collards (cooked, or boiled & drained) Kale (cooked, or  boiled & drained) Mustard greens (cooked, or boiled & drained) Parsley *serving size only =  cup Spinach (cooked, or boiled & drained) Swiss chard (cooked, or boiled & drained) Turnip greens (cooked, or boiled & drained)  Eat a consistent number of servings per week of foods MEDIUM-HIGH in Vitamin K (1 serving = 1 cup)  Asparagus (cooked, or boiled & drained) Broccoli (cooked, boiled & drained, or raw & chopped) Brussel sprouts (cooked, or boiled & drained) *serving size only =  cup Lettuce, raw (green leaf, endive, romaine) Spinach, raw Turnip greens, raw & chopped   These websites have more information on Coumadin (warfarin):  FailFactory.se; VeganReport.com.au;   Coronary Artery Bypass Grafting, Care After This sheet gives you information about how to care for yourself after your procedure. Your doctor may also give you more specific instructions. If you have problems or questions, contact your doctor. Follow these instructions at home: Medicines  Take over-the-counter and prescription medicines only as told by your doctor. Do not stop taking medicines or start any new medicines unless your doctor says it is okay.  If you were prescribed an antibiotic medicine, take it as told by your doctor. Do not stop taking the antibiotic even if you start to feel better.  Do not drive or use heavy machinery while taking prescription pain medicine. Incision care      Follow instructions from your doctor about how to take care of your incisions. Make sure you: ? Wash your hands with soap and water before you change your bandage (dressing). If you cannot use soap and water, use hand sanitizer. ? Change your dressing as told by your doctor. ? Leave stitches (sutures), skin glue, or skin tape (adhesive) strips in place. They may need to stay in place for 2 weeks or longer. If tape strips get loose and curl up, you may trim the loose edges. Do not remove tape strips  completely unless your doctor says it is okay.  Make sure the incisions are clean, dry, and protected.  Check your incision areas every day for signs of infection. Check for: ? Redness, swelling, or pain. ? Fluid or blood. ? Warmth. ? Pus or a bad smell.  If cuts were made in your legs: ? Avoid crossing your legs. ? Avoid sitting for long periods of time. Change positions every 30 minutes. ? Raise (elevate) your legs when you are sitting. Bathing  Do not take baths, swim, or use a hot tub until your doctor says it is okay.  You may shower. Pat the incisions dry. Do not rub the incisions to dry. Eating and drinking  Eat foods that are high in fiber, such as raw fruits and vegetables, whole grains, beans, and nuts. Meats should be lean cut. Avoid canned, processed, and fried foods. This can help prevent constipation. This is also a recommended part of a heart-healthy diet.  Drink enough fluid to keep your urine clear or pale yellow.  Limit alcohol intake to no more than 1 drink a day for nonpregnant women and 2 drinks a day for men. One drink equals 12 oz of beer, 5 oz of wine, or 1 oz of hard liquor. Activity  Rest and limit your activity  as told by your doctor. You may be told to: ? Stop any activity right away if you have chest pain, shortness of breath, irregular heartbeats, or dizziness. Get help right away if you have any of these symptoms. ? Move around often for short periods or take short walks as told by your doctor. Slowly increase your activities. You may need physical therapy or cardiac rehabilitation. ? Avoid lifting, pushing, or pulling anything that is heavier than 10 lb (4.5 kg) for at least 6 weeks or as told by your doctor.  Do not drive until your doctor says it is okay.  Ask your doctor when you can go back to work.  Ask your doctor when you can be sexually active. General instructions   Do not use any products that contain nicotine or tobacco, such as  cigarettes and e-cigarettes. If you need help quitting, ask your doctor.  Take 2-3 deep breaths every few hours during the day while you get better. This helps expand your lungs and prevent complications.  If you were given a device called an incentive spirometer, use it several times a day to practice deep breathing. Support your chest with a pillow or your arms when you take deep breaths or cough.  Wear compression stockings as told by your doctor. These stockings help to prevent blood clots and reduce swelling in your legs.  Weigh yourself every day. This helps to see if your body is holding (retaining) fluid that may make your heart and lungs work harder.  Keep all follow-up visits as told by your doctor. This is important. Contact a doctor if:  You have more redness, swelling, or pain around any cut.  You have more fluid or blood coming from any cut.  Any cut feels warm to the touch.  You have pus or a bad smell coming from any cut.  You have a fever.  You have swelling in your ankles or legs.  You have pain in your legs.  You gain 2 or more pounds (0.9 kg) a day.  You feel sick to your stomach (nauseous) or throw up (vomit).  You have watery poop (diarrhea). Get help right away if:  You have chest pain that goes to your jaw or arms.  You are short of breath.  You have a fast or irregular heartbeat.  You notice a "clicking" in your breastbone (sternum) when you move.  You feel numb or weak in your arms or legs.  You feel dizzy or light-headed. Summary  After the procedure, it is common to have pain or discomfort in the incision areas.  Do not take baths, swim, or use a hot tub until your health care provider approves.  Slowly increase your activities. You may need physical therapy or cardiac rehabilitation.  Weigh yourself every day. This helps to see if your body is holding (retaining) fluid that may make your heart and lungs work harder. This information  is not intended to replace advice given to you by your health care provider. Make sure you discuss any questions you have with your health care provider. Document Released: 03/29/2013 Document Revised: 05/06/2016 Document Reviewed: 05/06/2016 Elsevier Interactive Patient Education  2019 [employer].  Endoscopic Saphenous Vein Harvesting, Care After Refer to this sheet in the next few weeks. These instructions provide you with information about caring for yourself after your procedure. Your health care provider may also give you more specific instructions. Your treatment has been planned according to current medical practices, but problems  sometimes occur. Call your health care provider if you have any problems or questions after your procedure. What can I expect after the procedure? After the procedure, it is common to have:  Pain.  Bruising.  Swelling.  Numbness. Follow these instructions at home: Medicine  Take over-the-counter and prescription medicines only as told by your health care provider.  Do not drive or operate heavy machinery while taking prescription pain medicine. Incision care   Follow instructions from your health care provider about how to take care of the cut made during surgery (incision). Make sure you: ? Wash your hands with soap and water before you change your bandage (dressing). If soap and water are not available, use hand sanitizer. ? Change your dressing as told by your health care provider. ? Leave stitches (sutures), skin glue, or adhesive strips in place. These skin closures may need to be in place for 2 weeks or longer. If adhesive strip edges start to loosen and curl up, you may trim the loose edges. Do not remove adhesive strips completely unless your health care provider tells you to do that.  Check your incision area every day for signs of infection. Check for: ? More redness, swelling, or pain. ? More fluid or blood. ? Warmth. ? Pus or a bad  smell. General instructions  Raise (elevate) your legs above the level of your heart while you are sitting or lying down.  Do any exercises your health care providers have given you. These may include deep breathing, coughing, and walking exercises.  Do not shower, take baths, swim, or use a hot tub unless told by your health care provider.  Wear your elastic stocking if told by your health care provider.  Keep all follow-up visits as told by your health care provider. This is important. Contact a health care provider if:  Medicine does not help your pain.  Your pain gets worse.  You have new leg bruises or your leg bruises get bigger.  You have a fever.  Your leg feels numb.  You have more redness, swelling, or pain around your incision.  You have more fluid or blood coming from your incision.  Your incision feels warm to the touch.  You have pus or a bad smell coming from your incision. Get help right away if:  Your pain is severe.  You develop pain, tenderness, warmth, redness, or swelling in any part of your leg.  You have chest pain.  You have trouble breathing. This information is not intended to replace advice given to you by your health care provider. Make sure you discuss any questions you have with your health care provider. Document Released: 12/04/2010 Document Revised: 08/30/2015 Document Reviewed: 02/05/2015 Elsevier Interactive Patient Education  2019 [employer].

## 2018-09-20 NOTE — Progress Notes (Addendum)
[facility] Progress Note   Assessment/ Plan:    Dialysis Orders:[place] MWF ([facility]) 3h 47min 450/800 84.5kg Heparin none R AVFRetracrit 6000 no Fe or VDRA Recent hgb 10.5 on renvela 5 and sensipar 30  Assessment/Plan: 1.S/p 3 vessel CABG -stable, per Dr. Roxan Hockey 2. ESRD- usual HD MWF.  Stable resp.  HD today, plan for UF goal of 3L.   3. 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 Up 6- 8 kg, stable resp status--> will try to chip away, could get a healthy UF goal as long as HR tolerates 6. Nutrition- per primary team 7. Afib- amio, warfarin at home 8.  Dispo: per notes, to 4E after HD.  Subjective:    Seen in room.  Feeling OK this AM.  Hgbdown to 6.6.  CXR with some vasc congestion and small bilateral pleural effusions.  For HD today   Objective:   BP (!) 105/53   Pulse 85   Temp 98.2 F (36.8 C) (Oral)   Resp 14   Ht 5\' 11"  (1.803 m)   Wt 93.1 kg   SpO2 97%   BMI 28.63 kg/m   Physical Exam: Gen: NAD, sitting in bed CVS: irregular, soft systolic murmur Resp: normal WOB, diminished bibasilar breath sounds Abd: + distended with some small fluid wave Ext: 1+ shiny edema bilateral shins ACCESS: L FA AVF + T/B, aneurysmal but no thinned/ pink skin  Labs: BMET Recent Labs  Lab 09/16/18 1655  09/16/18 2000 09/16/18 2012 09/16/18 2227 09/17/18 0414 09/17/18 1700 09/18/18 0407 09/19/18 0416 09/20/18 0419  NA 137   < > 137 134*  --  137 138 138 138 139  K 6.5*   < > 7.0* 6.8* 6.6* 5.9* 4.7 4.8 4.0 3.6  CL 105  --  103 101  --  100 102 100 100 101  CO2 23  --  23  --   --  26 27 26 26 25   GLUCOSE 139*  --  194* 183*  --  77 131* 114* 104* 97  BUN 34*  --  37* 38*  --  39* 24* 32* 24* 36*  CREATININE 6.86*  --  7.36* 7.10*  --  8.04* 5.46* 6.28* 5.58* 7.55*   CALCIUM 7.8*  --  8.2*  --   --  8.5* 8.5* 7.8* 7.8* 7.4*   < > = values in this interval not displayed.   CBC Recent Labs  Lab 09/13/18 1904  09/17/18 1700 09/18/18 0407 09/19/18 0416 09/20/18 0419  WBC 6.5   < > 10.3 10.3 8.9 6.0  NEUTROABS 3.9  --   --   --   --   --   HGB 10.3*   < > 7.7* 7.6* 7.5* 6.6*  HCT 31.5*   < > 23.3* 24.1* 23.7* 21.1*  MCV 109.0*   < > 105.9* 109.0* 108.2* 109.9*  PLT 177   < > 86* 83* 78* 86*   < > = values in this interval not displayed.    @IMGRELPRIORS @ Medications:    . sodium chloride   Intravenous Once  . acetaminophen  1,000 mg Oral Q6H   Or  . acetaminophen (TYLENOL) oral liquid 160 mg/5 mL  1,000 mg Per Tube Q6H  . allopurinol  300 mg Oral Daily  . amiodarone  200 mg Oral BID  . aspirin EC  81 mg Oral Daily  . bisacodyl  10 mg Oral Daily   Or  . bisacodyl  10 mg Rectal Daily  . Chlorhexidine Gluconate Cloth  6 each Topical Q0600  . Chlorhexidine Gluconate Cloth  6 each Topical Q0600  . Chlorhexidine Gluconate Cloth  6 each Topical Q0600  . Chlorhexidine Gluconate Cloth  6 each Topical Daily  . cinacalcet  30 mg Oral Q supper  . docusate sodium  200 mg Oral Daily  . FLUoxetine  10 mg Oral Daily  . insulin aspart  0-24 Units Subcutaneous Q4H  . metoprolol tartrate  12.5 mg Oral BID   Or  . metoprolol tartrate  12.5 mg Per Tube BID  . multivitamin  1 tablet Oral QHS  . pantoprazole  40 mg Oral Daily  . pravastatin  40 mg Oral q1800  . predniSONE  5 mg Oral Q breakfast  . sodium chloride flush  3 mL Intravenous Q12H  . warfarin  1 mg Oral q1800  . Warfarin - Physician Dosing Inpatient   Does not apply [place], MD [facility] pgr [phone] 09/20/2018, 9:17 AM

## 2018-09-20 NOTE — Progress Notes (Signed)
[facility] BRIEF SICU PROGRESS NOTE  4 Days Post-Op  S/P Procedure(s) (LRB): REDO STERNOTOMY (N/A) CORONARY ARTERY BYPASS GRAFT times three      with left internal mammary artery and endoharvest of right leg greater saphenous vein (N/A) TRANSESOPHAGEAL ECHOCARDIOGRAM (TEE) (N/A)   Stable day  Plan: Continue current plan  Rexene Alberts, MD 09/20/2018 6:12 PM

## 2018-09-20 NOTE — Progress Notes (Signed)
CRITICAL VALUE ALERT  Critical Value:  Hgb 6.6  Date & Time Notied:  09/20/18 0500  Provider Notified: Servando Snare, MD  Orders Received/Actions taken:  Type and screen.  Pt. To receive volume during dialysis

## 2018-09-20 NOTE — Progress Notes (Signed)
Progress Note  Patient Name: Jeremy [name] Date of Encounter: 09/20/2018  Primary Cardiologist: Kathlyn Sacramento, MD   Subjective   Patient sitting in chair    Inpatient Medications    Scheduled Meds: . acetaminophen  1,000 mg Oral Q6H   Or  . acetaminophen (TYLENOL) oral liquid 160 mg/5 mL  1,000 mg Per Tube Q6H  . allopurinol  300 mg Oral Daily  . amiodarone  200 mg Oral BID  . aspirin EC  81 mg Oral Daily  . bisacodyl  10 mg Oral Daily   Or  . bisacodyl  10 mg Rectal Daily  . Chlorhexidine Gluconate Cloth  6 each Topical Q0600  . Chlorhexidine Gluconate Cloth  6 each Topical Q0600  . Chlorhexidine Gluconate Cloth  6 each Topical Q0600  . Chlorhexidine Gluconate Cloth  6 each Topical Daily  . cinacalcet  30 mg Oral Q supper  . docusate sodium  200 mg Oral Daily  . FLUoxetine  10 mg Oral Daily  . insulin aspart  0-24 Units Subcutaneous Q4H  . metoprolol tartrate  12.5 mg Oral BID   Or  . metoprolol tartrate  12.5 mg Per Tube BID  . multivitamin  1 tablet Oral QHS  . pantoprazole  40 mg Oral Daily  . pravastatin  40 mg Oral q1800  . predniSONE  5 mg Oral Q breakfast  . sodium chloride flush  3 mL Intravenous Q12H  . warfarin  1 mg Oral q1800  . Warfarin - Physician Dosing Inpatient   Does not apply q1800   Continuous Infusions: . sodium chloride Stopped (09/17/18 0822)  . sodium chloride Stopped (09/17/18 0819)  . sodium chloride 20 mL/hr at 09/20/18 0700  . dexmedetomidine (PRECEDEX) IV infusion Stopped (09/16/18 1428)  . lactated ringers    . lactated ringers    . lactated ringers Stopped (09/17/18 0819)  . magnesium sulfate    . nitroGLYCERIN Stopped (09/16/18 1405)  . phenylephrine (NEO-SYNEPHRINE) Adult infusion Stopped (09/20/18 0039)   PRN Meds: sodium chloride, lactated ringers, metoprolol tartrate, midazolam, morphine injection, ondansetron (ZOFRAN) IV, oxyCODONE, sodium chloride flush, traMADol   Vital Signs    Vitals:   09/20/18 0500 09/20/18 0600  09/20/18 0616 09/20/18 0700  BP: 113/73  94/71 (!) 105/53  Pulse: 84 93 94 85  Resp: 14 16 19 14   Temp:      TempSrc:      SpO2: 97% 98% 96% 97%  Weight: 93.1 kg     Height:        Intake/Output Summary (Last 24 hours) at 09/20/2018 0738 Last data filed at 09/20/2018 0700 Gross per 24 hour  Intake 1142.02 ml  Output 0 ml  Net 1142.02 ml   Last 3 Weights 09/20/2018 09/19/2018 09/18/2018  Weight (lbs) 205 lb 4 oz 203 lb 4.2 oz 198 lb 6.6 oz  Weight (kg) 93.1 kg 92.2 kg 90 kg      Telemetry    Afib 100s - Personally Reviewed  ECG    Not done    Physical Exam   GEN: No acute distress.   Neck: No JVD Cardiac: Irreg irreg   , no murmurs  Crisp valve sounds Respiratory:  Rel clear anteriorly GI: Soft, nontender, non-distended  MS: Triv edema;    Labs    Chemistry Recent Labs  Lab 09/13/18 1904  09/18/18 0407 09/19/18 0416 09/20/18 0419  NA 139   < > 138 138 139  K 4.4   < > 4.8 4.0  3.6  CL 95*   < > 100 100 101  CO2 30   < > 26 26 25   GLUCOSE 106*   < > 114* 104* 97  BUN 21   < > 32* 24* 36*  CREATININE 6.24*   < > 6.28* 5.58* 7.55*  CALCIUM 8.7*   < > 7.8* 7.8* 7.4*  PROT 7.3  --   --   --   --   ALBUMIN 3.7  --   --   --   --   AST 36  --   --   --   --   ALT 28  --   --   --   --   ALKPHOS 200*  --   --   --   --   BILITOT 0.6  --   --   --   --   GFRNONAA 9*   < > 9* 10* 7*  GFRAA 10*   < > 10* 12* 8*  ANIONGAP 14   < > 12 12 13    < > = values in this interval not displayed.     Hematology Recent Labs  Lab 09/18/18 0407 09/19/18 0416 09/20/18 0419  WBC 10.3 8.9 6.0  RBC 2.21* 2.19* 1.92*  HGB 7.6* 7.5* 6.6*  HCT 24.1* 23.7* 21.1*  MCV 109.0* 108.2* 109.9*  MCH 34.4* 34.2* 34.4*  MCHC 31.5 31.6 31.3  RDW 19.1* 18.4* 18.0*  PLT 83* 78* 86*    Cardiac EnzymesNo results for input(s): TROPONINI in the last 168 hours. No results for input(s): TROPIPOC in the last 168 hours.   BNPNo results for input(s): BNP, PROBNP in the last 168 hours.    DDimer No results for input(s): DDIMER in the last 168 hours.   Radiology    Dg Chest Port 1 View  Result Date: 09/18/2018 CLINICAL DATA:  Status post CABG EXAM: PORTABLE CHEST 1 VIEW COMPARISON:  September 17, 2018 FINDINGS: The PA catheter is been removed and a right IJ sheath remains. No pneumothorax. Chest tubes have been removed. The right lung is clear. Opacity remains in the left base, probably atelectasis and a small effusion. No change in cardiomegaly. No other changes. IMPRESSION: 1. Support apparatus as above. 2. Persistent mild opacity in left base favored represent atelectasis with a small effusion. 3. No other acute abnormalities. Electronically Signed   By: Dorise Bullion III M.D   On: 09/18/2018 09:59    Cardiac Studies     Patient Profile     63 y.o. male hx of CAD (s/p CABG), AS (s/p mechanical AVR), permanent Afib, IgA nephropathy (s/p tx), renal cell CA (s/p bilateral nephrectomy, anemia    Now post op CABG     Assessment & Plan    1.  Atrial fibrillation  Continue to work for rate control   On coumadin for valve as well    2.  CAD  S/p CABG  3  Acute on chronic systolic CHF   LVEF 30 to 35%  COntinue to follow    Strict I/O    3  Hx AVR 21 mm St Jude mechanical valve  Normal funciton by echo 5/ 2020  4  Hx ESRD   On dialysis  5  Hx HL  Continue statin  6  Anemia   To have tx at dialysis.      For questions or updates, please contact [place] Please consult [email or web address] for contact info under  Signed, Dorris Carnes, MD  09/20/2018, 7:38 AM

## 2018-09-20 NOTE — Progress Notes (Addendum)
[facility] DAILY ICU PROGRESS NOTE                   [address]            [place] [address]          [phone]   4 Days Post-Op Procedure(s) (LRB): REDO STERNOTOMY (N/A) CORONARY ARTERY BYPASS GRAFT times three      with left internal mammary artery and endoharvest of right leg greater saphenous vein (N/A) TRANSESOPHAGEAL ECHOCARDIOGRAM (TEE) (N/A)  Total Length of Stay:  LOS: 7 days   Subjective: Some DOE but overall feels pretty well  Objective: Vital signs in last 24 hours: Temp:  [98.2 F (36.8 C)-99.1 F (37.3 C)] 98.2 F (36.8 C) (06/15 0400) Pulse Rate:  [73-113] 85 (06/15 0700) Cardiac Rhythm: Atrial fibrillation (06/15 0400) Resp:  [10-26] 14 (06/15 0700) BP: (80-123)/(45-102) 105/53 (06/15 0700) SpO2:  [80 %-100 %] 97 % (06/15 0700) Arterial Line BP: (85-155)/(40-79) 128/50 (06/14 1445) Weight:  [93.1 kg] 93.1 kg (06/15 0500)  Filed Weights   09/18/18 2257 09/19/18 0500 09/20/18 0500  Weight: 90 kg 92.2 kg 93.1 kg    Weight change: 2.3 kg   Hemodynamic parameters for last 24 hours:    Intake/Output from previous day: 06/14 0701 - 06/15 0700 In: 1142 [P.O.:360; I.V.:782] Out: 0   Intake/Output this shift: No intake/output data recorded.  Current Meds: Scheduled Meds: . acetaminophen  1,000 mg Oral Q6H   Or  . acetaminophen (TYLENOL) oral liquid 160 mg/5 mL  1,000 mg Per Tube Q6H  . allopurinol  300 mg Oral Daily  . amiodarone  200 mg Oral BID  . aspirin EC  81 mg Oral Daily  . bisacodyl  10 mg Oral Daily   Or  . bisacodyl  10 mg Rectal Daily  . Chlorhexidine Gluconate Cloth  6 each Topical Q0600  . Chlorhexidine Gluconate Cloth  6 each Topical Q0600  . Chlorhexidine Gluconate Cloth  6 each Topical Q0600  . Chlorhexidine Gluconate Cloth  6 each Topical Daily  . cinacalcet  30 mg Oral Q supper  . docusate sodium  200 mg Oral Daily  . FLUoxetine  10 mg Oral Daily  . insulin aspart  0-24 Units Subcutaneous Q4H  . metoprolol  tartrate  12.5 mg Oral BID   Or  . metoprolol tartrate  12.5 mg Per Tube BID  . multivitamin  1 tablet Oral QHS  . pantoprazole  40 mg Oral Daily  . pravastatin  40 mg Oral q1800  . predniSONE  5 mg Oral Q breakfast  . sodium chloride flush  3 mL Intravenous Q12H  . warfarin  1 mg Oral q1800  . Warfarin - Physician Dosing Inpatient   Does not apply q1800   Continuous Infusions: . sodium chloride Stopped (09/17/18 0822)  . sodium chloride Stopped (09/17/18 0819)  . sodium chloride 20 mL/hr at 09/20/18 0700  . dexmedetomidine (PRECEDEX) IV infusion Stopped (09/16/18 1428)  . lactated ringers    . lactated ringers    . lactated ringers Stopped (09/17/18 0819)  . magnesium sulfate    . nitroGLYCERIN Stopped (09/16/18 1405)  . phenylephrine (NEO-SYNEPHRINE) Adult infusion Stopped (09/20/18 0039)   PRN Meds:.sodium chloride, lactated ringers, metoprolol tartrate, midazolam, morphine injection, ondansetron (ZOFRAN) IV, oxyCODONE, sodium chloride flush, traMADol  General appearance: alert, cooperative and no distress Heart: irregularly irregular rhythm Lungs: mildly dim left base, o/w clear Abdomen: nontender Extremities: + min edema Wound: incis healing  well  Lab Results: CBC: Recent Labs    09/19/18 0416 09/20/18 0419  WBC 8.9 6.0  HGB 7.5* 6.6*  HCT 23.7* 21.1*  PLT 78* 86*   BMET:  Recent Labs    09/19/18 0416 09/20/18 0419  NA 138 139  K 4.0 3.6  CL 100 101  CO2 26 25  GLUCOSE 104* 97  BUN 24* 36*  CREATININE 5.58* 7.55*  CALCIUM 7.8* 7.4*    CMET: Lab Results  Component Value Date   WBC 6.0 09/20/2018   HGB 6.6 (LL) 09/20/2018   HCT 21.1 (L) 09/20/2018   PLT 86 (L) 09/20/2018   GLUCOSE 97 09/20/2018   CHOL 176 08/08/2016   TRIG 158.0 (H) 08/08/2016   HDL 50.50 08/08/2016   LDLDIRECT 61.0 08/11/2017   LDLCALC 94 08/08/2016   ALT 28 09/13/2018   AST 36 09/13/2018   NA 139 09/20/2018   K 3.6 09/20/2018   CL 101 09/20/2018   CREATININE 7.55 (H)  09/20/2018   BUN 36 (H) 09/20/2018   CO2 25 09/20/2018   PSA 2.18 07/07/2016   INR 2.4 (H) 09/20/2018   HGBA1C 5.9 (H) 09/15/2018      PT/INR:  Recent Labs    09/20/18 0419  LABPROT 25.8*  INR 2.4*   Radiology: No results found.   Assessment/Plan: S/P Procedure(s) (LRB): REDO STERNOTOMY (N/A) CORONARY ARTERY BYPASS GRAFT times three      with left internal mammary artery and endoharvest of right leg greater saphenous vein (N/A) TRANSESOPHAGEAL ECHOCARDIOGRAM (TEE) (N/A)  1 hemodyn stable off neo, chronic afib, rate pretty well controlled, INR 2.4, d/c epw's today 2 macrocytic anemia- some DOE, will order 1 unit of PRBC's during dialysis 3 dialysis today, management of renal issues as per nephrology- hopefully will be able to get a fair amt of volume off  4 CXR may have some vasc congestion 6 cont same dose of coumadin, INR 2.4 7 routine activity progression/ pulm toilet as able 8 tx to 4e after dialysis   John Giovanni PA-C 09/20/2018 7:22 AM  Pager [phone]  Patient seen and examined, d/w Mr. Girtha Rm Plan as noted above  Remo Lipps C. Roxan Hockey, MD [facility] [phone]

## 2018-09-21 ENCOUNTER — Inpatient Hospital Stay (HOSPITAL_COMMUNITY): Payer: Medicare Other

## 2018-09-21 LAB — CBC
HCT: 25.7 % — ABNORMAL LOW (ref 39.0–52.0)
Hemoglobin: 8.2 g/dL — ABNORMAL LOW (ref 13.0–17.0)
MCH: 34.5 pg — ABNORMAL HIGH (ref 26.0–34.0)
MCHC: 31.9 g/dL (ref 30.0–36.0)
MCV: 108 fL — ABNORMAL HIGH (ref 80.0–100.0)
Platelets: 115 10*3/uL — ABNORMAL LOW (ref 150–400)
RBC: 2.38 MIL/uL — ABNORMAL LOW (ref 4.22–5.81)
RDW: 19.9 % — ABNORMAL HIGH (ref 11.5–15.5)
WBC: 6 10*3/uL (ref 4.0–10.5)
nRBC: 0.8 % — ABNORMAL HIGH (ref 0.0–0.2)

## 2018-09-21 LAB — BASIC METABOLIC PANEL
Anion gap: 10 (ref 5–15)
BUN: 22 mg/dL (ref 8–23)
CO2: 28 mmol/L (ref 22–32)
Calcium: 8.3 mg/dL — ABNORMAL LOW (ref 8.9–10.3)
Chloride: 103 mmol/L (ref 98–111)
Creatinine, Ser: 6.24 mg/dL — ABNORMAL HIGH (ref 0.61–1.24)
GFR calc Af Amer: 10 mL/min — ABNORMAL LOW (ref >60)
GFR calc non Af Amer: 9 mL/min — ABNORMAL LOW (ref >60)
Glucose, Bld: 147 mg/dL — ABNORMAL HIGH (ref 70–99)
Potassium: 3.9 mmol/L (ref 3.5–5.1)
Sodium: 141 mmol/L (ref 135–145)

## 2018-09-21 LAB — BPAM RBC
Blood Product Expiration Date: 202007032359
ISSUE DATE / TIME: 202006151503
Unit Type and Rh: 5100

## 2018-09-21 LAB — GLUCOSE, CAPILLARY
Glucose-Capillary: 110 mg/dL — ABNORMAL HIGH (ref 70–99)
Glucose-Capillary: 137 mg/dL — ABNORMAL HIGH (ref 70–99)
Glucose-Capillary: 147 mg/dL — ABNORMAL HIGH (ref 70–99)
Glucose-Capillary: 90 mg/dL (ref 70–99)
Glucose-Capillary: 93 mg/dL (ref 70–99)
Glucose-Capillary: 96 mg/dL (ref 70–99)

## 2018-09-21 LAB — TYPE AND SCREEN
ABO/RH(D): O POS
Antibody Screen: NEGATIVE
Unit division: 0

## 2018-09-21 LAB — PROTIME-INR
INR: 1.9 — ABNORMAL HIGH (ref 0.8–1.2)
Prothrombin Time: 21.8 seconds — ABNORMAL HIGH (ref 11.4–15.2)

## 2018-09-21 LAB — HEMOGLOBIN AND HEMATOCRIT, BLOOD
HCT: 25 % — ABNORMAL LOW (ref 39.0–52.0)
Hemoglobin: 8 g/dL — ABNORMAL LOW (ref 13.0–17.0)

## 2018-09-21 MED ORDER — BISACODYL 5 MG PO TBEC: 10.0000 mg | DELAYED_RELEASE_TABLET | Freq: Every day | ORAL | Status: DC | PRN

## 2018-09-21 MED ORDER — OXYCODONE HCL 5 MG PO TABS: 5.0000 mg | ORAL_TABLET | ORAL | Status: DC | PRN

## 2018-09-21 MED ORDER — MOVING RIGHT ALONG BOOK
Freq: Once | Status: AC
  Administered 2018-09-21: 16:00:00
  Filled 2018-09-21: qty 1

## 2018-09-21 MED ORDER — SODIUM CHLORIDE 0.9% FLUSH
3.0000 mL | Freq: Two times a day (BID) | INTRAVENOUS | Status: DC
  Administered 2018-09-22: 3 mL via INTRAVENOUS

## 2018-09-21 MED ORDER — WARFARIN SODIUM 2 MG PO TABS
2.0000 mg | ORAL_TABLET | Freq: Every day | ORAL | Status: DC
  Administered 2018-09-21: 17:00:00 2 mg via ORAL
  Filled 2018-09-21: qty 1

## 2018-09-21 MED ORDER — DOCUSATE SODIUM 100 MG PO CAPS
200.0000 mg | ORAL_CAPSULE | Freq: Every day | ORAL | Status: DC
  Administered 2018-09-22: 200 mg via ORAL
  Filled 2018-09-21: qty 2

## 2018-09-21 MED ORDER — METOPROLOL TARTRATE 12.5 MG HALF TABLET
12.5000 mg | ORAL_TABLET | Freq: Two times a day (BID) | ORAL | Status: DC
  Administered 2018-09-21: 22:00:00 12.5 mg via ORAL
  Filled 2018-09-21 (×2): qty 1

## 2018-09-21 MED ORDER — TRAMADOL HCL 50 MG PO TABS: 50.0000 mg | ORAL_TABLET | Freq: Two times a day (BID) | ORAL | Status: DC | PRN

## 2018-09-21 MED ORDER — ONDANSETRON HCL 4 MG/2ML IJ SOLN: 4.0000 mg | Freq: Four times a day (QID) | INTRAMUSCULAR | Status: DC | PRN

## 2018-09-21 MED ORDER — ONDANSETRON HCL 4 MG PO TABS: 4.0000 mg | ORAL_TABLET | Freq: Four times a day (QID) | ORAL | Status: DC | PRN

## 2018-09-21 MED ORDER — ROSUVASTATIN CALCIUM 5 MG PO TABS
10.0000 mg | ORAL_TABLET | Freq: Every day | ORAL | Status: DC
  Administered 2018-09-21: 10 mg via ORAL
  Filled 2018-09-21: qty 2

## 2018-09-21 MED ORDER — PANTOPRAZOLE SODIUM 40 MG PO TBEC
40.0000 mg | DELAYED_RELEASE_TABLET | Freq: Every day | ORAL | Status: DC
  Administered 2018-09-22: 40 mg via ORAL
  Filled 2018-09-21: qty 1

## 2018-09-21 MED ORDER — BISACODYL 10 MG RE SUPP: 10.0000 mg | Freq: Every day | RECTAL | Status: DC | PRN

## 2018-09-21 MED ORDER — SODIUM CHLORIDE 0.9% FLUSH: 3.0000 mL | INTRAVENOUS | Status: DC | PRN

## 2018-09-21 MED ORDER — SODIUM CHLORIDE 0.9 % IV SOLN: 250.0000 mL | INTRAVENOUS | Status: DC | PRN

## 2018-09-21 NOTE — Progress Notes (Signed)
[address]       [place] [address]             [phone]      5 Days Post-Op Procedure(s) (LRB): REDO STERNOTOMY (N/A) CORONARY ARTERY BYPASS GRAFT times three      with left internal mammary artery and endoharvest of right leg greater saphenous vein (N/A) TRANSESOPHAGEAL ECHOCARDIOGRAM (TEE) (N/A) Subjective: Feels stronger after transfusion, ambulation improving  Objective: Vital signs in last 24 hours: Temp:  [97.8 F (36.6 C)-99.4 F (37.4 C)] 98.2 F (36.8 C) (06/16 0300) Pulse Rate:  [61-115] 61 (06/16 0700) Cardiac Rhythm: Atrial fibrillation (06/16 0400) Resp:  [11-23] 11 (06/16 0700) BP: (97-163)/(55-89) 163/62 (06/16 0600) SpO2:  [68 %-100 %] 94 % (06/16 0700) Weight:  [90.2 kg-92.3 kg] 90.2 kg (06/15 1735)  Hemodynamic parameters for last 24 hours:    Intake/Output from previous day: 06/15 0701 - 06/16 0700 In: 487.7 [P.O.:440; I.V.:47.7] Out: 3000  Intake/Output this shift: No intake/output data recorded.  General appearance: alert, cooperative and no distress Heart: irregularly irregular rhythm Lungs: min dim in bases Abdomen: benign Extremities: + BLE edema Wound: healing well  Lab Results: Recent Labs    09/19/18 0416 09/20/18 0419 09/20/18 2321  WBC 8.9 6.0  --   HGB 7.5* 6.6* 8.0*  HCT 23.7* 21.1* 25.0*  PLT 78* 86*  --    BMET:  Recent Labs    09/19/18 0416 09/20/18 0419  NA 138 139  K 4.0 3.6  CL 100 101  CO2 26 25  GLUCOSE 104* 97  BUN 24* 36*  CREATININE 5.58* 7.55*  CALCIUM 7.8* 7.4*    PT/INR:  Recent Labs    09/20/18 2321  LABPROT 21.8*  INR 1.9*   ABG    Component Value Date/Time   PHART 7.329 (L) 09/16/2018 1955   HCO3 25.2 09/16/2018 1955   TCO2 24 09/16/2018 2012   ACIDBASEDEF 1.0 09/16/2018 1955   O2SAT 99.0 09/16/2018 1955   CBG (last 3)  Recent Labs    09/20/18 1643 09/20/18 1941 09/20/18 2324  GLUCAP 94 230* 84    Meds Scheduled Meds: . sodium chloride    Intravenous Once  . acetaminophen  1,000 mg Oral Q6H   Or  . acetaminophen (TYLENOL) oral liquid 160 mg/5 mL  1,000 mg Per Tube Q6H  . allopurinol  300 mg Oral Daily  . amiodarone  200 mg Oral BID  . aspirin EC  81 mg Oral Daily  . bisacodyl  10 mg Oral Daily   Or  . bisacodyl  10 mg Rectal Daily  . Chlorhexidine Gluconate Cloth  6 each Topical Q0600  . Chlorhexidine Gluconate Cloth  6 each Topical Q0600  . Chlorhexidine Gluconate Cloth  6 each Topical Q0600  . Chlorhexidine Gluconate Cloth  6 each Topical Daily  . cinacalcet  30 mg Oral Q supper  . darbepoetin (ARANESP) injection - DIALYSIS  100 mcg Intravenous Q Mon-HD  . docusate sodium  200 mg Oral Daily  . FLUoxetine  10 mg Oral Daily  . insulin aspart  0-24 Units Subcutaneous Q4H  . metoprolol tartrate  12.5 mg Oral BID   Or  . metoprolol tartrate  12.5 mg Per Tube BID  . midodrine  10 mg Oral Q M,W,F-HD  . multivitamin  1 tablet Oral QHS  . pantoprazole  40 mg Oral Daily  . pravastatin  40 mg Oral q1800  . predniSONE  5 mg Oral  Q breakfast  . sodium chloride flush  3 mL Intravenous Q12H  . warfarin  1 mg Oral q1800  . Warfarin - Physician Dosing Inpatient   Does not apply q1800   Continuous Infusions: . sodium chloride Stopped (09/17/18 0822)  . sodium chloride Stopped (09/17/18 0819)  . sodium chloride 20 mL/hr at 09/20/18 0700  . dexmedetomidine (PRECEDEX) IV infusion Stopped (09/16/18 1428)  . lactated ringers    . lactated ringers    . lactated ringers Stopped (09/17/18 0819)  . magnesium sulfate    . nitroGLYCERIN Stopped (09/16/18 1405)  . phenylephrine (NEO-SYNEPHRINE) Adult infusion Stopped (09/20/18 0039)   PRN Meds:.sodium chloride, lactated ringers, metoprolol tartrate, midazolam, morphine injection, ondansetron (ZOFRAN) IV, oxyCODONE, sodium chloride flush, traMADol  Xrays Dg Chest Port 1 View  Result Date: 09/20/2018 CLINICAL DATA:  Chest soreness, status post CABG surgery. Chest tube in place.  EXAM: PORTABLE CHEST 1 VIEW COMPARISON:  09/18/2018. FINDINGS: Right IJ catheter sheath remains in place. Heart is enlarged, stable. Mild diffuse interstitial prominence with left basilar airspace consolidation. Probable small left pleural effusion. Bibasilar subsegmental atelectasis and probable tiny right pleural effusion. Biapical pleural thickening. Old rib fractures. IMPRESSION: 1. Question mild pulmonary edema. 2. Persistent left basilar airspace opacification which may be due to atelectasis. 3. Suspect small bilateral pleural effusions. Electronically Signed   By: Lorin Picket M.D.   On: 09/20/2018 07:58    Assessment/Plan: S/P Procedure(s) (LRB): REDO STERNOTOMY (N/A) CORONARY ARTERY BYPASS GRAFT times three      with left internal mammary artery and endoharvest of right leg greater saphenous vein (N/A) TRANSESOPHAGEAL ECHOCARDIOGRAM (TEE) (N/A)  1 conts to improve daily 2 hemodyn stable with rate mostly controlled afib on amiodarone, BP is chronically low , gets midodrine in dialysis, some variability of measurements 3 tolerating dialysis well, conts as per nephrology 4 H/H improved after transfusion 5 INR down a little, will give 2 mg today 6 BS variable control, Hg A1C 5.9, cont SSI 7 cont rehab, routine pulm toilet, poss home 1-2 days  LOS: 8 days    John Giovanni [facility] 09/21/2018 Pager [phone]

## 2018-09-21 NOTE — Progress Notes (Signed)
Pt taken down to xray via wheel chair, on room air, pt is alert and oriented. RN accompanied pt.

## 2018-09-21 NOTE — Progress Notes (Signed)
[facility] Progress Note   Assessment/ Plan:    Dialysis Orders:[facility] MWF ([facility]) 3h 34min 450/800 84.5kg Heparin none R AVFRetracrit 6000 no Fe or VDRA Recent hgb 10.5 on renvela 5 and sensipar 30  Assessment/Plan: 1.S/p 3 vessel CABG -stable, per Dr. Roxan Hockey 2. ESRD- usual HD MWF.  Stable resp.  HD yesterday, plan for UF goal of 3-3.5 L tomorrow 6/17.   3. Anemia ckd/ abl- Hb 6.6, got 1 u pRBCS 6/15 in HD, give aranesp with HD as well (received yesterday 6/15) 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BP/volume- chronic low BP - on midodrine 10 pre HD and optional mid treatment. Up 6- 8 kg, stable resp status--> will try to chip away, could get a healthy UF goal as long as HR tolerates 6. Nutrition- per primary team 7. Afib- amio, warfarin at home 8.  Dispo: in 1-2 days  Subjective:    S/p HD and 1 u pRBCs.  Walked this AM.     Objective:   BP (!) 117/58   Pulse 74   Temp 98.2 F (36.8 C) (Oral)   Resp 16   Ht 5\' 11"  (1.803 m)   Wt 90.2 kg   SpO2 97%   BMI 27.73 kg/m   Physical Exam: Gen: NAD, sitting in chair, NAD CVS: irregular, soft systolic murmur with soft click Resp: normal WOB, diminished bibasilar breath sounds Abd: distention better Ext: 1+ shiny edema bilateral shins, slightly improved ACCESS: L FA AVF + T/B, aneurysmal but no thinned/ pink skin  Labs: BMET Recent Labs  Lab 09/16/18 1655  09/16/18 2000 09/16/18 2012 09/16/18 2227 09/17/18 0414 09/17/18 1700 09/18/18 0407 09/19/18 0416 09/20/18 0419  NA 137   < > 137 134*  --  137 138 138 138 139  K 6.5*   < > 7.0* 6.8* 6.6* 5.9* 4.7 4.8 4.0 3.6  CL 105  --  103 101  --  100 102 100 100 101  CO2 23  --  23  --   --  26 27 26 26 25   GLUCOSE 139*  --  194* 183*  --  77 131* 114* 104* 97  BUN 34*  --  37* 38*  --  39* 24* 32* 24* 36*  CREATININE 6.86*  --  7.36* 7.10*  --  8.04* 5.46* 6.28* 5.58* 7.55*  CALCIUM 7.8*  --  8.2*  --   --  8.5* 8.5*  7.8* 7.8* 7.4*  PHOS  --   --   --   --   --   --   --   --   --  4.3   < > = values in this interval not displayed.   CBC Recent Labs  Lab 09/17/18 1700 09/18/18 0407 09/19/18 0416 09/20/18 0419 09/20/18 2321  WBC 10.3 10.3 8.9 6.0  --   HGB 7.7* 7.6* 7.5* 6.6* 8.0*  HCT 23.3* 24.1* 23.7* 21.1* 25.0*  MCV 105.9* 109.0* 108.2* 109.9*  --   PLT 86* 83* 78* 86*  --     @IMGRELPRIORS @ Medications:    . sodium chloride   Intravenous Once  . acetaminophen  1,000 mg Oral Q6H   Or  . acetaminophen (TYLENOL) oral liquid 160 mg/5 mL  1,000 mg Per Tube Q6H  . allopurinol  300 mg Oral Daily  . amiodarone  200 mg Oral BID  . aspirin EC  81 mg Oral Daily  . bisacodyl  10 mg Oral Daily   Or  .  bisacodyl  10 mg Rectal Daily  . Chlorhexidine Gluconate Cloth  6 each Topical Q0600  . Chlorhexidine Gluconate Cloth  6 each Topical Q0600  . Chlorhexidine Gluconate Cloth  6 each Topical Q0600  . Chlorhexidine Gluconate Cloth  6 each Topical Daily  . cinacalcet  30 mg Oral Q supper  . darbepoetin (ARANESP) injection - DIALYSIS  100 mcg Intravenous Q Mon-HD  . docusate sodium  200 mg Oral Daily  . FLUoxetine  10 mg Oral Daily  . insulin aspart  0-24 Units Subcutaneous Q4H  . metoprolol tartrate  12.5 mg Oral BID   Or  . metoprolol tartrate  12.5 mg Per Tube BID  . midodrine  10 mg Oral Q M,W,F-HD  . multivitamin  1 tablet Oral QHS  . pantoprazole  40 mg Oral Daily  . predniSONE  5 mg Oral Q breakfast  . rosuvastatin  10 mg Oral q1800  . sodium chloride flush  3 mL Intravenous Q12H  . warfarin  2 mg Oral q1800  . Warfarin - Physician Dosing Inpatient   Does not apply [place], MD [facility] pgr [phone] 09/21/2018, 10:07 AM

## 2018-09-21 NOTE — Progress Notes (Addendum)
Progress Note  Patient Name: Jeremy [name] Date of Encounter: 09/21/2018  Primary Cardiologist: Kathlyn [place], MD   Subjective   Breathing is OK  No CP    Inpatient Medications    Scheduled Meds: . sodium chloride   Intravenous Once  . acetaminophen  1,000 mg Oral Q6H   Or  . acetaminophen (TYLENOL) oral liquid 160 mg/5 mL  1,000 mg Per Tube Q6H  . allopurinol  300 mg Oral Daily  . amiodarone  200 mg Oral BID  . aspirin EC  81 mg Oral Daily  . bisacodyl  10 mg Oral Daily   Or  . bisacodyl  10 mg Rectal Daily  . Chlorhexidine Gluconate Cloth  6 each Topical Q0600  . Chlorhexidine Gluconate Cloth  6 each Topical Q0600  . Chlorhexidine Gluconate Cloth  6 each Topical Q0600  . Chlorhexidine Gluconate Cloth  6 each Topical Daily  . cinacalcet  30 mg Oral Q supper  . darbepoetin (ARANESP) injection - DIALYSIS  100 mcg Intravenous Q Mon-HD  . docusate sodium  200 mg Oral Daily  . FLUoxetine  10 mg Oral Daily  . insulin aspart  0-24 Units Subcutaneous Q4H  . metoprolol tartrate  12.5 mg Oral BID   Or  . metoprolol tartrate  12.5 mg Per Tube BID  . midodrine  10 mg Oral Q M,W,F-HD  . multivitamin  1 tablet Oral QHS  . pantoprazole  40 mg Oral Daily  . pravastatin  40 mg Oral q1800  . predniSONE  5 mg Oral Q breakfast  . sodium chloride flush  3 mL Intravenous Q12H  . warfarin  2 mg Oral q1800  . Warfarin - Physician Dosing Inpatient   Does not apply q1800   Continuous Infusions: . sodium chloride Stopped (09/17/18 0822)  . sodium chloride Stopped (09/17/18 0819)  . sodium chloride 20 mL/hr at 09/20/18 0700  . dexmedetomidine (PRECEDEX) IV infusion Stopped (09/16/18 1428)  . lactated ringers    . lactated ringers    . lactated ringers Stopped (09/17/18 0819)  . magnesium sulfate    . nitroGLYCERIN Stopped (09/16/18 1405)  . phenylephrine (NEO-SYNEPHRINE) Adult infusion Stopped (09/20/18 0039)   PRN Meds: sodium chloride, lactated ringers, metoprolol tartrate,  midazolam, morphine injection, ondansetron (ZOFRAN) IV, oxyCODONE, sodium chloride flush, traMADol   Vital Signs    Vitals:   09/21/18 0700 09/21/18 0800 09/21/18 0900 09/21/18 0932  BP: (!) 88/55 107/69 116/60 (!) 117/58  Pulse: 61 77 76 74  Resp: 11 16 16    Temp:      TempSrc:      SpO2: 94% 97% 97%   Weight:      Height:        Intake/Output Summary (Last 24 hours) at 09/21/2018 0950 Last data filed at 09/21/2018 0942 Gross per 24 hour  Intake 695.03 ml  Output 3000 ml  Net -2304.97 ml   Last 3 Weights 09/20/2018 09/20/2018 09/20/2018  Weight (lbs) 198 lb 13.7 oz 203 lb 7.8 oz 205 lb 4 oz  Weight (kg) 90.2 kg 92.3 kg 93.1 kg      Telemetry    Afib   90s   - Personally Reviewed  ECG    Not done   Personally Reviewed  Physical Exam   GEN: No acute distress.   Neck No JVD Cardiac:  Irreg irreg  Normal S1, S2  No   rubs, or gallops.  Respiratory: Clear to auscultation bilaterally. GI: Soft, nontender, non-distended  MS:  Triv edema; No deformity. Neuro:  Nonfocal  Psych: Normal affect   Labs    Chemistry Recent Labs  Lab 09/18/18 0407 09/19/18 0416 09/20/18 0419  NA 138 138 139  K 4.8 4.0 3.6  CL 100 100 101  CO2 26 26 25   GLUCOSE 114* 104* 97  BUN 32* 24* 36*  CREATININE 6.28* 5.58* 7.55*  CALCIUM 7.8* 7.8* 7.4*  GFRNONAA 9* 10* 7*  GFRAA 10* 12* 8*  ANIONGAP 12 12 13      Hematology Recent Labs  Lab 09/18/18 0407 09/19/18 0416 09/20/18 0419 09/20/18 2321  WBC 10.3 8.9 6.0  --   RBC 2.21* 2.19* 1.92*  --   HGB 7.6* 7.5* 6.6* 8.0*  HCT 24.1* 23.7* 21.1* 25.0*  MCV 109.0* 108.2* 109.9*  --   MCH 34.4* 34.2* 34.4*  --   MCHC 31.5 31.6 31.3  --   RDW 19.1* 18.4* 18.0*  --   PLT 83* 78* 86*  --     Cardiac EnzymesNo results for input(s): TROPONINI in the last 168 hours. No results for input(s): TROPIPOC in the last 168 hours.   BNPNo results for input(s): BNP, PROBNP in the last 168 hours.   DDimer No results for input(s): DDIMER in the  last 168 hours.   Radiology    Dg Chest Port 1 View  Result Date: 09/20/2018 CLINICAL DATA:  Chest soreness, status post CABG surgery. Chest tube in place. EXAM: PORTABLE CHEST 1 VIEW COMPARISON:  09/18/2018. FINDINGS: Right IJ catheter sheath remains in place. Heart is enlarged, stable. Mild diffuse interstitial prominence with left basilar airspace consolidation. Probable small left pleural effusion. Bibasilar subsegmental atelectasis and probable tiny right pleural effusion. Biapical pleural thickening. Old rib fractures. IMPRESSION: 1. Question mild pulmonary edema. 2. Persistent left basilar airspace opacification which may be due to atelectasis. 3. Suspect small bilateral pleural effusions. Electronically Signed   By: Lorin Picket M.D.   On: 09/20/2018 07:58    Cardiac Studies     Patient Profile     63 y.o. male hx of CAD sp CABG, AV dz and chronic atrial fibrillation    Assessment & Plan    1  CAD   Pt is s/p CABG   Doing well    2  Afib  Pt continues with rate control with Amiodarone  On anticoagulation  Rates OK    3  Hx AV dz  S/p AVR  Continue coumadin   4  Hx HL   Pt had not been on statin   ? Intolerance   Pharmacy has spoken with pt   He is willing to try Crestor   Will start on 10 mg   Plan for outpt f/u    For questions or updates, please contact [place] Please consult [email or web address] for contact info under        Signed, Dorris Carnes, MD  09/21/2018, 9:50 AM

## 2018-09-21 NOTE — Progress Notes (Signed)
Patient ID: Jeremy [name], male   DOB: 07/21/1955, 63 y.o.   MRN: [id number] EVENING ROUNDS NOTE :     [address]       [place] [address]             [phone]                 5 Days Post-Op Procedure(s) (LRB): REDO STERNOTOMY (N/A) CORONARY ARTERY BYPASS GRAFT times three      with left internal mammary artery and endoharvest of right leg greater saphenous vein (N/A) TRANSESOPHAGEAL ECHOCARDIOGRAM (TEE) (N/A)  Total Length of Stay:  LOS: 8 days  BP (!) 161/84   Pulse 99   Temp 98.3 F (36.8 C) (Oral)   Resp (!) 22   Ht 5\' 11"  (1.803 m)   Wt 90.2 kg   SpO2 92%   BMI 27.73 kg/m   .Intake/Output      06/15 0701 - 06/16 0700 06/16 0701 - 06/17 0700   P.O. 440 720   I.V. (mL/kg) 47.7 (0.5) 3 (0)   Total Intake(mL/kg) 487.7 (5.4) 723 (8)   Urine (mL/kg/hr)     Other 3000    Stool 0    Total Output 3000    Net -2512.3 +723        Stool Occurrence 2 x 1 x     . sodium chloride       Lab Results  Component Value Date   WBC 6.0 09/21/2018   HGB 8.2 (L) 09/21/2018   HCT 25.7 (L) 09/21/2018   PLT 115 (L) 09/21/2018   GLUCOSE 147 (H) 09/21/2018   CHOL 176 08/08/2016   TRIG 158.0 (H) 08/08/2016   HDL 50.50 08/08/2016   LDLDIRECT 61.0 08/11/2017   LDLCALC 94 08/08/2016   ALT 28 09/13/2018   AST 36 09/13/2018   NA 141 09/21/2018   K 3.9 09/21/2018   CL 103 09/21/2018   CREATININE 6.24 (H) 09/21/2018   BUN 22 09/21/2018   CO2 28 09/21/2018   PSA 2.18 07/07/2016   INR 1.9 (H) 09/20/2018   HGBA1C 5.9 (H) 09/15/2018   Stable day Poss home in am    Grace Isaac MD  Beeper [phone] Office [phone] 09/21/2018 5:21 PM

## 2018-09-21 NOTE — Discharge Summary (Signed)
Physician Discharge Summary  Patient ID: Jeremy [name] MRN: [id number] DOB/AGE: September 25, 1955 63 y.o.  Admit date: 09/13/2018 Discharge date: 09/22/2018  Admission Diagnoses:CAD  Discharge Diagnoses:  Active Problems:   Coronary artery disease of native artery of native heart with stable angina pectoris (HCC)   S/P CABG x 3  Patient Active Problem List   Diagnosis Date Noted  . S/P CABG x 3 09/16/2018  . Coronary artery disease of native artery of native heart with stable angina pectoris ([place]) 09/13/2018  . Encounter for therapeutic drug monitoring 09/02/2018  . Hydrocele 08/16/2018  . Penile pain 08/14/2018  . Chronic systolic (congestive) heart failure ([place])   . IBS (irritable bowel syndrome) 03/19/2018  . Abnormal stress test 03/19/2018  . URI (upper respiratory infection) 03/19/2018  . Muscle strain 03/19/2018  . Cut of lower leg 11/17/2017  . Depression 05/17/2017  . AV fistula ([place]) 05/17/2017  . Chronic pain of both knees 05/17/2017  . Arthralgia 05/17/2017  . Rash 05/17/2017  . Osteoarthritis 03/17/2017  . Trigger little finger of right hand 03/17/2017  . 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 Atrial fibrillation ([place]) [I48.91] 10/30/2016  . Ventral hernia 10/14/2016  . History of renal cell carcinoma 08/04/2016  . Renal transplant failure and rejection 07/31/2016  . AKI (acute kidney injury) ([place]) 10/20/2015  . Volume overload 10/20/2015  . Microhematuria 10/01/2015  . Transplant recipient 10/01/2015  . Hypogonadism in male 07/25/2015  . 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 Gout 04/24/2015  . Visit for biopsy 01/03/2015  . Proteinuria 01/03/2015  . Chronic anticoagulation 11/06/2014  . Iron deficiency anemia 11/02/2014  . Rectal bleeding 11/02/2014  . Malignant neoplasm of unspecified kidney, except renal pelvis ([place]) 05/09/2014  . Epigastric hernia 02/08/2014  . Incisional hernia, without obstruction or gangrene 01/30/2014  . Secondary  renal hyperparathyroidism ([place]) 08/16/2013  . Vitamin D deficiency 08/16/2013  . Cervical radicular pain 07/07/2013  . Neck pain 07/07/2013  . Spinal stenosis 06/20/2013  . OSA (obstructive sleep apnea)   . Edema 02/22/2013  . Immunosuppression ([place]) 02/22/2013  . Hernia of abdominal wall 01/17/2013  . Hypomagnesemia 01/03/2013  . Tremor 12/30/2012  . History of kidney transplant 12/15/2012  . Immunosuppressive management encounter following kidney transplant 12/15/2012  . Kidney transplant status 12/15/2012  . ESRD on hemodialysis ([place]) 12/05/2012  . S/P angioplasty with stent 09/07/2012  . Aortic valve replaced 09/07/2012  . Lumbar radiculopathy 09/06/2012  . ESRD on dialysis ([place]) 07/26/2012  . Hyperlipidemia 07/26/2012  . S/P AVR (aortic valve replacement) 06/23/2012  . Aortic valve disorder 06/14/2012  . Heart failure ([place]) 05/30/2012  . Thyroid nodule 06/26/2011  . Status post percutaneous transluminal coronary angioplasty 05/27/2011  . CAD (coronary artery disease) 05/26/2011  . Essential hypertension 05/19/2011    HPI: at time of surgeon evaluation:  Jeremy [name] returns to further discuss coronary bypass grafting  Jeremy [name] is a 63 year old man with a complicated medical history.  He had an aortic valve replacement for aortic stenosis at [facility] in 2014.  That was a mechanical valve.  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  He had a renal transplant in 2014.  He has been back on dialysis since 2017.  He also has a history of hypertension, hyperlipidemia, chronic atrial fibrillation, arthritis, anemia of chronic disease, and obstructive sleep apnea.  He been having chest pain dating back to the fall 2019.  His pain had become more  frequent and more severe.  Stress test showed an ejection fraction of 35% which deteriorated with stress.  An echocardiogram at [facility] also confirmed an EF of 35%.  He had  moderate pulmonary hypertension and some right ventricular dysfunction.  There was mild AI, MR, and TR.  He became upset with cardiology at [facility] and transferred his care to Dr. [name].  Dr. [name] did cardiac catheterization which showed severe two-vessel disease involving the LAD and right coronaries.  He was referred for coronary bypass grafting.  I saw him in the office on 06/10/2018.  He was having chest pain essentially every day at that point was using nitroglycerin sometimes multiple times a day.  He was advised to undergo coronary bypass grafting, but refused at that time due to the [place] situation.  He wanted to wait.  He understood the risk.  In the interim since his last visit he was started on amiodarone.  He says that his pain is become much less frequent since then.  He is only taken 1 nitroglycerin tablet in the past week.  The patient was admitted preoperatively for bridging heparin anticoagulation due to his mechanical aortic valve.  Discharged Condition: good  Hospital Course: Patient was admitted for heparin bridging prior to proceeding with surgery.  He remained stable and on 09/16/2018 he was taken to the operating room where he underwent the below described procedure.  He tolerated it well and was taken to the surgical intensive care unit in stable condition.  Postoperative hospital course:  The patient has progressed quite well.  He initially did require some low-dose neo-for blood pressure support but this was weaned without difficulty.  He has continued in chronic atrial fibrillation with good rate control and has been restarted back on Coumadin for this as well as his mechanical aortic valve.  We were assisted with management by nephrology who managed all aspects of dialysis and associated medical management of renal issues.  He has done well in this regard.  He does require Midodrine drain for blood pressure assistance during dialysis but otherwise they have been able to obtain  good volumes.  He does have both chronic and acute blood loss anemia and did require transfusion during the postoperative period.  His values have stabilized and most recent hematocrit is 25%.  He also receives Aranesp.  His incisions are noted to be healing well without evidence of infection.  Oxygen has been weaned and he maintains good saturations on room air.  He is tolerating gradually increasing activities using standard cardiac rehab protocols.  All routine lines, monitors and drainage devices have been discontinued in the standard fashion.  At the time of discharge the patient is felt to be quite stable.  Consults: cardiology  Significant Diagnostic Studies: Routine postoperative serial labs and chest x-rays.  Treatments: surgery  OPERATIVE REPORT  DATE OF PROCEDURE:  09/16/2018  PREOPERATIVE DIAGNOSIS:  Severe 2-vessel coronary disease with impaired left ventricular function.  POSTOPERATIVE DIAGNOSIS:  Severe 2-vessel coronary disease with impaired left ventricular function.  PROCEDURE:  Redo median sternotomy, Coronary artery bypass grafting x 3      Left internal mammary artery to left anterior descending,      Saphenous vein graft to first diagonal,      Saphenous vein graft to posterior descending Endoscopic vein harvest right leg.  SURGEON:  Modesto Charon, MD  ASSISTANT:  Jadene Pierini, PA-C  ANESTHESIA:  General.  FINDINGS:  Transesophageal echocardiography shows an ejection fraction of approximately 35% with  left ventricular hypertrophy.  Good function of the prosthetic valve with minimal AI secondary to washing jets, mild mitral regurgitation.  Severe adhesions  around the aorta and right atrial appendage, adhesions minimal otherwise.  Diagonal fair quality.  LAD and posterior descending good quality vessels at the site of anastomosis.  Good quality conduits.  Post-bypass TEE unchanged from pre-bypass study.gery:   Discharge Exam: Blood pressure 136/76,  pulse 81, temperature 98.3 F (36.8 C), temperature source Oral, resp. rate 15, height 5\' 11"  (1.803 m), weight 88.3 kg, SpO2 98 %.  General appearance: alert, cooperative and no distress Heart: regular rate and rhythm and crisp valve click Lungs: clear to auscultation bilaterally Abdomen: benign Extremities: + edema Wound: incis healing well Disposition: Discharge disposition: 01-Home or Self Care       Discharge Instructions    Discharge patient   Complete by: As directed    Discharge disposition: 01-Home or Self Care   Discharge patient date: 09/22/2018     Allergies as of 09/22/2018      Reactions   Statins    Other reaction(s): Arthralgias (intolerance), Myalgias (intolerance) Pt taking pravastatin    Sertraline Rash      Medication List    STOP taking these medications   cephALEXin 250 MG capsule Commonly known as: KEFLEX   nitroGLYCERIN 0.4 MG SL tablet Commonly known as: NITROSTAT   pravastatin 10 MG tablet Commonly known as: PRAVACHOL     TAKE these medications   acetaminophen 500 MG tablet Commonly known as: TYLENOL Take 1,000 mg 2 (two) times daily as needed by mouth for moderate pain.   allopurinol 300 MG tablet Commonly known as: ZYLOPRIM Take 300 mg by mouth daily.   amiodarone 200 MG tablet Commonly known as: PACERONE Take 1 tablet (200 mg total) by mouth 2 (two) times daily.   aspirin 81 MG EC tablet Take 1 tablet (81 mg total) by mouth daily. Start taking on: September 23, 2018   cinacalcet 30 MG tablet Commonly known as: SENSIPAR Take 30 mg by mouth daily.   Darbepoetin Alfa 100 MCG/0.5ML Sosy injection Commonly known as: ARANESP Inject 0.5 mLs (100 mcg total) into the vein every Monday with hemodialysis. Start taking on: September 27, 2018   FLUoxetine 10 MG capsule Commonly known as: PROZAC TAKE 1 CAPSULE BY MOUTH EVERY DAY   folic acid-vitamin b complex-vitamin c-selenium-zinc 3 MG Tabs tablet Take 1 tablet by mouth daily.    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   metoprolol tartrate 25 MG tablet Commonly known as: LOPRESSOR Take 0.5 tablets (12.5 mg total) by mouth 2 (two) times daily.   midodrine 10 MG tablet Commonly known as: PROAMATINE Take 10 mg by mouth See admin instructions. Take on Monday, Wednesday and Friday before dialysis   NAUZENE PO Take 1-2 tablets 2 (two) times daily as needed by mouth (nausea).   omeprazole 20 MG capsule Commonly known as: PRILOSEC Take 20 mg by mouth daily.   oxyCODONE 5 MG immediate release tablet Commonly known as: Oxy IR/ROXICODONE Take 1-2 tablets (5-10 mg total) by mouth every 6 (six) hours as needed for up to 7 days for severe pain.   predniSONE 5 MG tablet Commonly known as: DELTASONE Take 5 mg by mouth daily with breakfast.   rosuvastatin 10 MG tablet Commonly known as: CRESTOR Take 1 tablet (10 mg total) by mouth daily at 6 PM.   sevelamer carbonate 800 MG tablet Commonly known as:  RENVELA Take 40,000 mg by mouth 3 (three) times daily with meals. 5 tabs per meal   warfarin 1 MG tablet Commonly known as: COUMADIN Take as directed. If you are unsure how to take this medication, talk to your nurse or doctor. Original instructions: Take 1 tablet (1 mg total) by mouth daily at 6 PM. What changed:   how much to take  when to take this  additional instructions      Follow-up Information    Melrose Nakayama, MD Follow up.   Specialty: Cardiothoracic Surgery Contact information: [address] [place] [address] [phone]        Wellington Hampshire, MD Follow up.   Specialty: Cardiology Contact information: [place] [address] [phone]          The patient has been discharged on:   1.Beta Blocker:  Yes [  y ]                              No   [   ]                              If No,  reason:  2.Ace Inhibitor/ARB: Yes [   ]                                     No  [ n   ]                                     If No, reason:low BP  3.Statin:   Yes [ y  ]                  No  [   ]                  If No, reason:  4.Ecasa:  Yes  [ y  ]                  No   [   ]                  If No, reason:  Signed: John Giovanni 09/22/2018, 1:48 PM

## 2018-09-22 ENCOUNTER — Telehealth: Payer: Medicare Other | Admitting: Nurse Practitioner

## 2018-09-22 LAB — POCT I-STAT 7, (LYTES, BLD GAS, ICA,H+H)
Acid-Base Excess: 7 mmol/L — ABNORMAL HIGH (ref 0.0–2.0)
Acid-Base Excess: 6 mmol/L — ABNORMAL HIGH (ref 0.0–2.0)
Acid-Base Excess: 2 mmol/L (ref 0.0–2.0)
Bicarbonate: 31.9 mmol/L — ABNORMAL HIGH (ref 20.0–28.0)
Bicarbonate: 30.1 mmol/L — ABNORMAL HIGH (ref 20.0–28.0)
Bicarbonate: 27.6 mmol/L (ref 20.0–28.0)
Calcium, Ion: 0.83 mmol/L — CL (ref 1.15–1.40)
Calcium, Ion: 0.93 mmol/L — ABNORMAL LOW (ref 1.15–1.40)
Calcium, Ion: 0.98 mmol/L — ABNORMAL LOW (ref 1.15–1.40)
HCT: 22 % — ABNORMAL LOW (ref 39.0–52.0)
HCT: 23 % — ABNORMAL LOW (ref 39.0–52.0)
HCT: 26 % — ABNORMAL LOW (ref 39.0–52.0)
Hemoglobin: 7.5 g/dL — ABNORMAL LOW (ref 13.0–17.0)
Hemoglobin: 7.8 g/dL — ABNORMAL LOW (ref 13.0–17.0)
Hemoglobin: 8.8 g/dL — ABNORMAL LOW (ref 13.0–17.0)
O2 Saturation: 100 %
O2 Saturation: 100 %
O2 Saturation: 98 %
Potassium: 5 mmol/L (ref 3.5–5.1)
Potassium: 6.9 mmol/L (ref 3.5–5.1)
Potassium: 5.9 mmol/L — ABNORMAL HIGH (ref 3.5–5.1)
Sodium: 138 mmol/L (ref 135–145)
Sodium: 133 mmol/L — ABNORMAL LOW (ref 135–145)
Sodium: 135 mmol/L (ref 135–145)
TCO2: 33 mmol/L — ABNORMAL HIGH (ref 22–32)
TCO2: 31 mmol/L (ref 22–32)
TCO2: 29 mmol/L (ref 22–32)
pCO2 arterial: 49.2 mmHg — ABNORMAL HIGH (ref 32.0–48.0)
pCO2 arterial: 40.5 mmHg (ref 32.0–48.0)
pCO2 arterial: 45.2 mmHg (ref 32.0–48.0)
pH, Arterial: 7.42 (ref 7.350–7.450)
pH, Arterial: 7.479 — ABNORMAL HIGH (ref 7.350–7.450)
pH, Arterial: 7.393 (ref 7.350–7.450)
pO2, Arterial: 370 mmHg — ABNORMAL HIGH (ref 83.0–108.0)
pO2, Arterial: 333 mmHg — ABNORMAL HIGH (ref 83.0–108.0)
pO2, Arterial: 112 mmHg — ABNORMAL HIGH (ref 83.0–108.0)

## 2018-09-22 LAB — CBC
HCT: 26.1 % — ABNORMAL LOW (ref 39.0–52.0)
Hemoglobin: 8.4 g/dL — ABNORMAL LOW (ref 13.0–17.0)
MCH: 35 pg — ABNORMAL HIGH (ref 26.0–34.0)
MCHC: 32.2 g/dL (ref 30.0–36.0)
MCV: 108.8 fL — ABNORMAL HIGH (ref 80.0–100.0)
Platelets: 121 10*3/uL — ABNORMAL LOW (ref 150–400)
RBC: 2.4 MIL/uL — ABNORMAL LOW (ref 4.22–5.81)
RDW: 19.8 % — ABNORMAL HIGH (ref 11.5–15.5)
WBC: 6.8 10*3/uL (ref 4.0–10.5)
nRBC: 0.7 % — ABNORMAL HIGH (ref 0.0–0.2)

## 2018-09-22 LAB — GLUCOSE, CAPILLARY
Glucose-Capillary: 81 mg/dL (ref 70–99)
Glucose-Capillary: 63 mg/dL — ABNORMAL LOW (ref 70–99)
Glucose-Capillary: 107 mg/dL — ABNORMAL HIGH (ref 70–99)

## 2018-09-22 LAB — RENAL FUNCTION PANEL
Albumin: 3 g/dL — ABNORMAL LOW (ref 3.5–5.0)
Anion gap: 14 (ref 5–15)
BUN: 31 mg/dL — ABNORMAL HIGH (ref 8–23)
CO2: 24 mmol/L (ref 22–32)
Calcium: 8.2 mg/dL — ABNORMAL LOW (ref 8.9–10.3)
Chloride: 100 mmol/L (ref 98–111)
Creatinine, Ser: 7.61 mg/dL — ABNORMAL HIGH (ref 0.61–1.24)
GFR calc Af Amer: 8 mL/min — ABNORMAL LOW (ref >60)
GFR calc non Af Amer: 7 mL/min — ABNORMAL LOW (ref >60)
Glucose, Bld: 128 mg/dL — ABNORMAL HIGH (ref 70–99)
Phosphorus: 3.9 mg/dL (ref 2.5–4.6)
Potassium: 4 mmol/L (ref 3.5–5.1)
Sodium: 138 mmol/L (ref 135–145)

## 2018-09-22 LAB — POCT I-STAT 4, (NA,K, GLUC, HGB,HCT)
Glucose, Bld: 89 mg/dL (ref 70–99)
Glucose, Bld: 97 mg/dL (ref 70–99)
Glucose, Bld: 86 mg/dL (ref 70–99)
Glucose, Bld: 117 mg/dL — ABNORMAL HIGH (ref 70–99)
Glucose, Bld: 122 mg/dL — ABNORMAL HIGH (ref 70–99)
Glucose, Bld: 140 mg/dL — ABNORMAL HIGH (ref 70–99)
Glucose, Bld: 164 mg/dL — ABNORMAL HIGH (ref 70–99)
HCT: 28 % — ABNORMAL LOW (ref 39.0–52.0)
HCT: 29 % — ABNORMAL LOW (ref 39.0–52.0)
HCT: 27 % — ABNORMAL LOW (ref 39.0–52.0)
HCT: 22 % — ABNORMAL LOW (ref 39.0–52.0)
HCT: 22 % — ABNORMAL LOW (ref 39.0–52.0)
HCT: 25 % — ABNORMAL LOW (ref 39.0–52.0)
HCT: 29 % — ABNORMAL LOW (ref 39.0–52.0)
Hemoglobin: 9.5 g/dL — ABNORMAL LOW (ref 13.0–17.0)
Hemoglobin: 9.9 g/dL — ABNORMAL LOW (ref 13.0–17.0)
Hemoglobin: 9.2 g/dL — ABNORMAL LOW (ref 13.0–17.0)
Hemoglobin: 7.5 g/dL — ABNORMAL LOW (ref 13.0–17.0)
Hemoglobin: 7.5 g/dL — ABNORMAL LOW (ref 13.0–17.0)
Hemoglobin: 8.5 g/dL — ABNORMAL LOW (ref 13.0–17.0)
Hemoglobin: 9.9 g/dL — ABNORMAL LOW (ref 13.0–17.0)
Potassium: 4.3 mmol/L (ref 3.5–5.1)
Potassium: 4.6 mmol/L (ref 3.5–5.1)
Potassium: 4.8 mmol/L (ref 3.5–5.1)
Potassium: 6.2 mmol/L — ABNORMAL HIGH (ref 3.5–5.1)
Potassium: 7.2 mmol/L (ref 3.5–5.1)
Potassium: 5 mmol/L (ref 3.5–5.1)
Potassium: 5.7 mmol/L — ABNORMAL HIGH (ref 3.5–5.1)
Sodium: 136 mmol/L (ref 135–145)
Sodium: 135 mmol/L (ref 135–145)
Sodium: 138 mmol/L (ref 135–145)
Sodium: 133 mmol/L — ABNORMAL LOW (ref 135–145)
Sodium: 133 mmol/L — ABNORMAL LOW (ref 135–145)
Sodium: 134 mmol/L — ABNORMAL LOW (ref 135–145)
Sodium: 136 mmol/L (ref 135–145)

## 2018-09-22 LAB — POCT I-STAT EG7
Acid-Base Excess: 4 mmol/L — ABNORMAL HIGH (ref 0.0–2.0)
Bicarbonate: 29.5 mmol/L — ABNORMAL HIGH (ref 20.0–28.0)
Calcium, Ion: 0.94 mmol/L — ABNORMAL LOW (ref 1.15–1.40)
HCT: 24 % — ABNORMAL LOW (ref 39.0–52.0)
Hemoglobin: 8.2 g/dL — ABNORMAL LOW (ref 13.0–17.0)
O2 Saturation: 66 %
Potassium: 5.2 mmol/L — ABNORMAL HIGH (ref 3.5–5.1)
Sodium: 137 mmol/L (ref 135–145)
TCO2: 31 mmol/L (ref 22–32)
pCO2, Ven: 46.9 mmHg (ref 44.0–60.0)
pH, Ven: 7.407 (ref 7.250–7.430)
pO2, Ven: 35 mmHg (ref 32.0–45.0)

## 2018-09-22 LAB — PROTIME-INR
INR: 2.1 — ABNORMAL HIGH (ref 0.8–1.2)
Prothrombin Time: 23.4 seconds — ABNORMAL HIGH (ref 11.4–15.2)

## 2018-09-22 MED ORDER — METOPROLOL TARTRATE 25 MG PO TABS: 12.5000 mg | ORAL_TABLET | Freq: Two times a day (BID) | ORAL | 1 refills | Status: DC

## 2018-09-22 MED ORDER — DARBEPOETIN ALFA 100 MCG/0.5ML IJ SOSY: 100.0000 ug | PREFILLED_SYRINGE | INTRAMUSCULAR | Status: DC

## 2018-09-22 MED ORDER — OXYCODONE HCL 5 MG PO TABS: 5.0000 mg | ORAL_TABLET | Freq: Four times a day (QID) | ORAL | 0 refills | Status: AC | PRN

## 2018-09-22 MED ORDER — ROSUVASTATIN CALCIUM 10 MG PO TABS: 10.0000 mg | ORAL_TABLET | Freq: Every day | ORAL | 1 refills | Status: DC

## 2018-09-22 MED ORDER — ASPIRIN 81 MG PO TBEC: 81.0000 mg | DELAYED_RELEASE_TABLET | Freq: Every day | ORAL | Status: DC

## 2018-09-22 MED ORDER — WARFARIN SODIUM 1 MG PO TABS: 1.0000 mg | ORAL_TABLET | Freq: Every day | ORAL | Status: DC

## 2018-09-22 NOTE — Progress Notes (Addendum)
[address]       [place] [address]             [phone]      6 Days Post-Op Procedure(s) (LRB): REDO STERNOTOMY (N/A) CORONARY ARTERY BYPASS GRAFT times three      with left internal mammary artery and endoharvest of right leg greater saphenous vein (N/A) TRANSESOPHAGEAL ECHOCARDIOGRAM (TEE) (N/A) Subjective: Feels well  Objective: Vital signs in last 24 hours: Temp:  [97.7 F (36.5 C)-98.3 F (36.8 C)] 97.9 F (36.6 C) (06/17 0000) Pulse Rate:  [72-99] 76 (06/17 0500) Cardiac Rhythm: (P) Atrial fibrillation (06/17 0723) Resp:  [12-22] 15 (06/17 0500) BP: (107-161)/(55-95) 145/69 (06/17 0500) SpO2:  [92 %-99 %] 95 % (06/17 0500) Weight:  [92 kg] 92 kg (06/17 0500)  Hemodynamic parameters for last 24 hours:    Intake/Output from previous day: 06/16 0701 - 06/17 0700 In: 723 [P.O.:720; I.V.:3] Out: -  Intake/Output this shift: No intake/output data recorded.  General appearance: alert, cooperative and no distress Heart: regular rate and rhythm and crisp valve click Lungs: clear to auscultation bilaterally Abdomen: benign Extremities: + edema Wound: incis healing well  Lab Results: Recent Labs    09/20/18 0419 09/20/18 2321 09/21/18 1440  WBC 6.0  --  6.0  HGB 6.6* 8.0* 8.2*  HCT 21.1* 25.0* 25.7*  PLT 86*  --  115*   BMET:  Recent Labs    09/20/18 0419 09/21/18 1440  NA 139 141  K 3.6 3.9  CL 101 103  CO2 25 28  GLUCOSE 97 147*  BUN 36* 22  CREATININE 7.55* 6.24*  CALCIUM 7.4* 8.3*    PT/INR:  Recent Labs    09/22/18 0413  LABPROT 23.4*  INR 2.1*   ABG    Component Value Date/Time   PHART 7.329 (L) 09/16/2018 1955   HCO3 25.2 09/16/2018 1955   TCO2 24 09/16/2018 2012   ACIDBASEDEF 1.0 09/16/2018 1955   O2SAT 99.0 09/16/2018 1955   CBG (last 3)  Recent Labs    09/21/18 2014 09/21/18 2330 09/22/18 0351  GLUCAP 137* 110* 81    Meds Scheduled Meds: . sodium chloride   Intravenous Once  .  allopurinol  300 mg Oral Daily  . amiodarone  200 mg Oral BID  . aspirin EC  81 mg Oral Daily  . Chlorhexidine Gluconate Cloth  6 each Topical Q0600  . Chlorhexidine Gluconate Cloth  6 each Topical Q0600  . Chlorhexidine Gluconate Cloth  6 each Topical Q0600  . cinacalcet  30 mg Oral Q supper  . darbepoetin (ARANESP) injection - DIALYSIS  100 mcg Intravenous Q Mon-HD  . docusate sodium  200 mg Oral Daily  . FLUoxetine  10 mg Oral Daily  . insulin aspart  0-24 Units Subcutaneous Q4H  . metoprolol tartrate  12.5 mg Oral BID  . midodrine  10 mg Oral Q M,W,F-HD  . multivitamin  1 tablet Oral QHS  . pantoprazole  40 mg Oral QAC breakfast  . predniSONE  5 mg Oral Q breakfast  . rosuvastatin  10 mg Oral q1800  . sodium chloride flush  3 mL Intravenous Q12H  . warfarin  2 mg Oral q1800  . Warfarin - Physician Dosing Inpatient   Does not apply q1800   Continuous Infusions: . sodium chloride     PRN Meds:.sodium chloride, bisacodyl **OR** bisacodyl, ondansetron **OR** ondansetron (ZOFRAN) IV, oxyCODONE, sodium chloride flush, traMADol  Xrays Dg Chest 2 View  Result Date: 09/21/2018 CLINICAL DATA:  Chest pain status post cardiac surgery. EXAM: CHEST - 2 VIEW COMPARISON:  09/20/2018 FINDINGS: The right-sided central venous catheter has been removed. The patient is status post prior median sternotomy. The heart size is enlarged. There arm proving bibasilar airspace opacities. There is no pneumothorax. There is no acute osseous abnormality. The sternotomy wires appear intact. There is a small amount of pneumomediastinum, likely related to the recent surgical intervention. There are old healed right-sided rib fractures. IMPRESSION: 1. Interval removal of the right-sided central venous catheter. 2. Stable cardiomegaly. 3. Persistent but improving bibasilar airspace opacities favored to represent a combination of both pleural fluid and atelectasis. 4. Anterior pneumomediastinum, likely related to the  recent surgical intervention. Electronically Signed   By: Constance Holster M.D.   On: 09/21/2018 15:40    Assessment/Plan: S/P Procedure(s) (LRB): REDO STERNOTOMY (N/A) CORONARY ARTERY BYPASS GRAFT times three      with left internal mammary artery and endoharvest of right leg greater saphenous vein (N/A) TRANSESOPHAGEAL ECHOCARDIOGRAM (TEE) (N/A)  1 conts to do well 2 currently on dialysis 3 hemodyn stable with chronic afib, INR 2.1 today , will resume 1 mg coumadin 4 no other new labs 5 conts routine pulm toilet/cardiac rehab 6 poss home later today vs tomorrow if all agree  LOS: 9 days    John Giovanni PA-C 09/22/2018  Pager [phone]  Patient seen and examined, agree with above On HD currently Dc home later today  Remo Lipps C. Roxan Hockey, MD [facility] [phone]

## 2018-09-22 NOTE — Progress Notes (Signed)
Progress Note  Patient Name: Jeremy [name] Date of Encounter: 09/22/2018  Primary Cardiologist: Kathlyn Sacramento, MD   Subjective   Breathing is OK  Undergoing dialysis    Inpatient Medications    Scheduled Meds: . sodium chloride   Intravenous Once  . allopurinol  300 mg Oral Daily  . amiodarone  200 mg Oral BID  . aspirin EC  81 mg Oral Daily  . Chlorhexidine Gluconate Cloth  6 each Topical Q0600  . Chlorhexidine Gluconate Cloth  6 each Topical Q0600  . Chlorhexidine Gluconate Cloth  6 each Topical Q0600  . cinacalcet  30 mg Oral Q supper  . darbepoetin (ARANESP) injection - DIALYSIS  100 mcg Intravenous Q Mon-HD  . docusate sodium  200 mg Oral Daily  . FLUoxetine  10 mg Oral Daily  . insulin aspart  0-24 Units Subcutaneous Q4H  . metoprolol tartrate  12.5 mg Oral BID  . midodrine  10 mg Oral Q M,W,F-HD  . multivitamin  1 tablet Oral QHS  . pantoprazole  40 mg Oral QAC breakfast  . predniSONE  5 mg Oral Q breakfast  . rosuvastatin  10 mg Oral q1800  . sodium chloride flush  3 mL Intravenous Q12H  . warfarin  2 mg Oral q1800  . Warfarin - Physician Dosing Inpatient   Does not apply q1800   Continuous Infusions: . sodium chloride     PRN Meds: sodium chloride, bisacodyl **OR** bisacodyl, ondansetron **OR** ondansetron (ZOFRAN) IV, oxyCODONE, sodium chloride flush, traMADol   Vital Signs    Vitals:   09/22/18 0200 09/22/18 0300 09/22/18 0400 09/22/18 0500  BP: 126/64 115/62 138/76 (!) 145/69  Pulse: 79 75 74 76  Resp: 13 14 14 15   Temp:      TempSrc:      SpO2: 97% 97% 97% 95%  Weight:    92 kg  Height:        Intake/Output Summary (Last 24 hours) at 09/22/2018 0746 Last data filed at 09/21/2018 1600 Gross per 24 hour  Intake 723 ml  Output -  Net 723 ml   Last 3 Weights 09/22/2018 09/20/2018 09/20/2018  Weight (lbs) 202 lb 13.2 oz 198 lb 13.7 oz 203 lb 7.8 oz  Weight (kg) 92 kg 90.2 kg 92.3 kg      Telemetry    Afib  80s to  90s   - Personally Reviewed   ECG    Not done   Personally Reviewed  Physical Exam   GEN: No acute distress.   Neck JVP is nromal   Cardiac:  Irreg irreg  Normal S1, S2 Crisp valve  No   rubs, or gallops.  Respiratory: REl clear anteriorly   GI: Soft, nontender, non-distended  MS: Triv edema; No deformity. Neuro:  Nonfocal  Psych: Normal affect   Labs    Chemistry Recent Labs  Lab 09/19/18 0416 09/20/18 0419 09/21/18 1440  NA 138 139 141  K 4.0 3.6 3.9  CL 100 101 103  CO2 26 25 28   GLUCOSE 104* 97 147*  BUN 24* 36* 22  CREATININE 5.58* 7.55* 6.24*  CALCIUM 7.8* 7.4* 8.3*  GFRNONAA 10* 7* 9*  GFRAA 12* 8* 10*  ANIONGAP 12 13 10      Hematology Recent Labs  Lab 09/19/18 0416 09/20/18 0419 09/20/18 2321 09/21/18 1440  WBC 8.9 6.0  --  6.0  RBC 2.19* 1.92*  --  2.38*  HGB 7.5* 6.6* 8.0* 8.2*  HCT 23.7* 21.1* 25.0* 25.7*  MCV 108.2* 109.9*  --  108.0*  MCH 34.2* 34.4*  --  34.5*  MCHC 31.6 31.3  --  31.9  RDW 18.4* 18.0*  --  19.9*  PLT 78* 86*  --  115*    Cardiac EnzymesNo results for input(s): TROPONINI in the last 168 hours. No results for input(s): TROPIPOC in the last 168 hours.   BNPNo results for input(s): BNP, PROBNP in the last 168 hours.   DDimer No results for input(s): DDIMER in the last 168 hours.   Radiology    Dg Chest 2 View  Result Date: 09/21/2018 CLINICAL DATA:  Chest pain status post cardiac surgery. EXAM: CHEST - 2 VIEW COMPARISON:  09/20/2018 FINDINGS: The right-sided central venous catheter has been removed. The patient is status post prior median sternotomy. The heart size is enlarged. There arm proving bibasilar airspace opacities. There is no pneumothorax. There is no acute osseous abnormality. The sternotomy wires appear intact. There is a small amount of pneumomediastinum, likely related to the recent surgical intervention. There are old healed right-sided rib fractures. IMPRESSION: 1. Interval removal of the right-sided central venous catheter. 2. Stable  cardiomegaly. 3. Persistent but improving bibasilar airspace opacities favored to represent a combination of both pleural fluid and atelectasis. 4. Anterior pneumomediastinum, likely related to the recent surgical intervention. Electronically Signed   By: Constance Holster M.D.   On: 09/21/2018 15:40    Cardiac Studies     Patient Profile     63 y.o. male hx of CAD sp CABG, AV dz and chronic atrial fibrillation    Assessment & Plan    1  CAD   Pt is s/p CABG   6 days ago    2  Afib  Pt continues with rate control with Amiodarone  On anticoagulation  Rates OK    3  Hx AV dz  S/p AVR  Continue coumadin   4  Hx HL   Pt had not been on statin prior to admit   Started on Crestor 10 mg    5  ESRD   Getting HD now.  Gets midodrine on days of dialysis  For questions or updates, please contact [place] Please consult [email or web address] for contact info under        Signed, Dorris Carnes, MD  09/22/2018, 7:46 AM

## 2018-09-22 NOTE — Procedures (Signed)
Patient seen and examined on Hemodialysis. BP (!) 155/68   Pulse 87   Temp 98.1 F (36.7 C) (Oral)   Resp 18   Ht 5\' 11"  (1.803 m)   Wt 92 kg   SpO2 95%   BMI 28.29 kg/m   QB 400 mL/ min via L AVF UF goal 4L   Tolerating treatment without complaints at this time.  Reading on [place] pgr [phone] 9:26 AM

## 2018-09-22 NOTE — Progress Notes (Signed)
[facility] Progress Note   Assessment/ Plan:    Dialysis Orders:[facility] MWF ([place]) 3h 79min 450/800 84.5kg Heparin none R AVFRetracrit 6000 no Fe or VDRA Recent hgb 10.5 on renvela 5 and sensipar 30  Assessment/Plan: 1.S/p 3 vessel CABG -stable, per Dr. Roxan Hockey 2. ESRD- usual HD MWF.  Stable resp. UF goal of 3-3.5 L 6/17.  Tolerating well so far 3. Anemia ckd/ abl- Hb 6.6, got 1 u pRBCS 6/15 in HD, give aranesp 100 mcg with HD as well (6/15).  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 Up 6- 8 kg, stable resp status--> will try to chip away, could get a healthy UF goal as long as HR tolerates 6. Nutrition- per primary team 7. Afib- amio, warfarin at home 8.  Dispo: OK from renal perspective to go today after HD  Subjective:    On HD.  Looks great!  Feeling well.   Objective:   BP (!) 155/68   Pulse 87   Temp 98.1 F (36.7 C) (Oral)   Resp 18   Ht 5\' 11"  (1.803 m)   Wt 92 kg   SpO2 95%   BMI 28.29 kg/m   Physical Exam: Gen: NAD, sitting in chair, NAD CVS: irregular, soft systolic murmur with soft click Resp: normal WOB, diminished bibasilar breath sounds Abd: distention better Ext: 1+ shiny edema bilateral shins, slightly improved ACCESS: L FA AVF + T/B, aneurysmal but no thinned/ pink skin  Labs: BMET Recent Labs  Lab 09/17/18 0414 09/17/18 1700 09/18/18 0407 09/19/18 0416 09/20/18 0419 09/21/18 1440 09/22/18 0804  NA 137 138 138 138 139 141 138  K 5.9* 4.7 4.8 4.0 3.6 3.9 4.0  CL 100 102 100 100 101 103 100  CO2 26 27 26 26 25 28 24   GLUCOSE 77 131* 114* 104* 97 147* 128*  BUN 39* 24* 32* 24* 36* 22 31*  CREATININE 8.04* 5.46* 6.28* 5.58* 7.55* 6.24* 7.61*  CALCIUM 8.5* 8.5* 7.8* 7.8* 7.4* 8.3* 8.2*  PHOS  --   --   --   --  4.3  --  3.9   CBC Recent Labs  Lab 09/19/18 0416 09/20/18 0419 09/20/18 2321  09/21/18 1440 09/22/18 0803  WBC 8.9 6.0  --  6.0 6.8  HGB 7.5* 6.6* 8.0* 8.2* 8.4*  HCT 23.7* 21.1* 25.0* 25.7* 26.1*  MCV 108.2* 109.9*  --  108.0* 108.8*  PLT 78* 86*  --  115* 121*    @IMGRELPRIORS @ Medications:    . sodium chloride   Intravenous Once  . allopurinol  300 mg Oral Daily  . amiodarone  200 mg Oral BID  . aspirin EC  81 mg Oral Daily  . Chlorhexidine Gluconate Cloth  6 each Topical Q0600  . Chlorhexidine Gluconate Cloth  6 each Topical Q0600  . Chlorhexidine Gluconate Cloth  6 each Topical Q0600  . cinacalcet  30 mg Oral Q supper  . darbepoetin (ARANESP) injection - DIALYSIS  100 mcg Intravenous Q Mon-HD  . docusate sodium  200 mg Oral Daily  . FLUoxetine  10 mg Oral Daily  . insulin aspart  0-24 Units Subcutaneous Q4H  . metoprolol tartrate  12.5 mg Oral BID  . midodrine  10 mg Oral Q M,W,F-HD  . multivitamin  1 tablet Oral QHS  . pantoprazole  40 mg Oral QAC breakfast  . predniSONE  5 mg Oral Q  breakfast  . rosuvastatin  10 mg Oral q1800  . sodium chloride flush  3 mL Intravenous Q12H  . warfarin  1 mg Oral q1800  . Warfarin - Physician Dosing Inpatient   Does not apply [place], MD [facility] pgr [phone] 09/22/2018, 9:24 AM

## 2018-09-22 NOTE — Progress Notes (Signed)
CARDIAC REHAB PHASE I   PRE:  Rate/Rhythm: 93 afib    BP: sitting 131/76    SaO2: 100 RA  MODE:  Ambulation: 190 ft   POST:  Rate/Rhythm: 115 afib    BP: sitting 129/82     SaO2: 99 RA  Pt able to walk without AD 190 ft. Fatigued with distance. Fairly steady. Pt does have cane and RW at home that he can use in open spaces until he feels more confident. VSS. Discussed sternal precautions, IS, exercise at home, and CRPII. Good reception and will refer to [place]. Will defer diet ed to RD at HD center. Pt is interested in participating in Virtual Cardiac Rehab. Pt advised that Virtual Cardiac Rehab is provided at no cost to the patient.  Checklist:  1. Pt has smart device  ie smartphone and/or ipad for downloading an app  Yes 2. Reliable internet/wifi service    Yes 3. Understands how to use their smartphone and navigate within an app.  Yes  Reviewed with pt the scheduling process for virtual cardiac rehab.  Pt verbalized understanding.  [place], ACSM 09/22/2018 2:22 PM

## 2018-09-23 LAB — GLUCOSE, CAPILLARY: Glucose-Capillary: 86 mg/dL (ref 70–99)

## 2018-09-23 LAB — POCT I-STAT 4, (NA,K, GLUC, HGB,HCT)
Glucose, Bld: 117 mg/dL — ABNORMAL HIGH (ref 70–99)
HCT: 22 % — ABNORMAL LOW (ref 39.0–52.0)
Hemoglobin: 7.5 g/dL — ABNORMAL LOW (ref 13.0–17.0)
Potassium: 6.3 mmol/L (ref 3.5–5.1)
Sodium: 133 mmol/L — ABNORMAL LOW (ref 135–145)

## 2018-09-24 ENCOUNTER — Telehealth: Payer: Self-pay | Admitting: Family Medicine

## 2018-09-24 DIAGNOSIS — N186 End stage renal disease: Secondary | ICD-10-CM | POA: Diagnosis not present

## 2018-09-24 DIAGNOSIS — E8779 Other fluid overload: Secondary | ICD-10-CM | POA: Diagnosis not present

## 2018-09-24 DIAGNOSIS — Z23 Encounter for immunization: Secondary | ICD-10-CM | POA: Diagnosis not present

## 2018-09-24 DIAGNOSIS — N2581 Secondary hyperparathyroidism of renal origin: Secondary | ICD-10-CM | POA: Diagnosis not present

## 2018-09-24 DIAGNOSIS — D631 Anemia in chronic kidney disease: Secondary | ICD-10-CM | POA: Diagnosis not present

## 2018-09-24 NOTE — Telephone Encounter (Signed)
Called for Transitional Care management, no answer left voicemail with request to return call to office.

## 2018-09-26 DIAGNOSIS — I4821 Permanent atrial fibrillation: Secondary | ICD-10-CM | POA: Diagnosis not present

## 2018-09-26 LAB — POCT INR: INR: 1.7 — AB (ref <=1.1)

## 2018-09-27 ENCOUNTER — Ambulatory Visit (INDEPENDENT_AMBULATORY_CARE_PROVIDER_SITE_OTHER): Payer: Medicare Other

## 2018-09-27 DIAGNOSIS — Z5181 Encounter for therapeutic drug level monitoring: Secondary | ICD-10-CM

## 2018-09-27 DIAGNOSIS — D631 Anemia in chronic kidney disease: Secondary | ICD-10-CM | POA: Diagnosis not present

## 2018-09-27 DIAGNOSIS — Z23 Encounter for immunization: Secondary | ICD-10-CM | POA: Diagnosis not present

## 2018-09-27 DIAGNOSIS — E8779 Other fluid overload: Secondary | ICD-10-CM | POA: Diagnosis not present

## 2018-09-27 DIAGNOSIS — I4821 Permanent atrial fibrillation: Secondary | ICD-10-CM

## 2018-09-27 DIAGNOSIS — N186 End stage renal disease: Secondary | ICD-10-CM | POA: Diagnosis not present

## 2018-09-27 DIAGNOSIS — N2581 Secondary hyperparathyroidism of renal origin: Secondary | ICD-10-CM | POA: Diagnosis not present

## 2018-09-27 DIAGNOSIS — Z952 Presence of prosthetic heart valve: Secondary | ICD-10-CM

## 2018-09-27 LAB — POCT INR: INR: 1.7 — AB (ref 2.0–3.0)

## 2018-09-27 NOTE — Patient Instructions (Signed)
Spoke with pt and instructed him to take 2 tablets tonight and then resume taking 1 mg every day except 2mg  on Sundays. Amiodarone started 08/05/18. Recheck INR in 1 week. Home INR monitor ~0.2 off compared to lab check historically.

## 2018-09-28 DIAGNOSIS — N2581 Secondary hyperparathyroidism of renal origin: Secondary | ICD-10-CM | POA: Diagnosis not present

## 2018-09-28 DIAGNOSIS — Z23 Encounter for immunization: Secondary | ICD-10-CM | POA: Diagnosis not present

## 2018-09-28 DIAGNOSIS — E8779 Other fluid overload: Secondary | ICD-10-CM | POA: Diagnosis not present

## 2018-09-28 DIAGNOSIS — N186 End stage renal disease: Secondary | ICD-10-CM | POA: Diagnosis not present

## 2018-09-28 DIAGNOSIS — D631 Anemia in chronic kidney disease: Secondary | ICD-10-CM | POA: Diagnosis not present

## 2018-09-29 DIAGNOSIS — E8779 Other fluid overload: Secondary | ICD-10-CM | POA: Diagnosis not present

## 2018-09-29 DIAGNOSIS — D631 Anemia in chronic kidney disease: Secondary | ICD-10-CM | POA: Diagnosis not present

## 2018-09-29 DIAGNOSIS — N2581 Secondary hyperparathyroidism of renal origin: Secondary | ICD-10-CM | POA: Diagnosis not present

## 2018-09-29 DIAGNOSIS — Z23 Encounter for immunization: Secondary | ICD-10-CM | POA: Diagnosis not present

## 2018-09-29 DIAGNOSIS — N186 End stage renal disease: Secondary | ICD-10-CM | POA: Diagnosis not present

## 2018-09-30 ENCOUNTER — Ambulatory Visit (INDEPENDENT_AMBULATORY_CARE_PROVIDER_SITE_OTHER): Payer: Medicare Other

## 2018-09-30 ENCOUNTER — Encounter (INDEPENDENT_AMBULATORY_CARE_PROVIDER_SITE_OTHER): Payer: Self-pay | Admitting: Vascular Surgery

## 2018-09-30 ENCOUNTER — Ambulatory Visit (INDEPENDENT_AMBULATORY_CARE_PROVIDER_SITE_OTHER): Payer: Medicare Other | Admitting: Vascular Surgery

## 2018-09-30 ENCOUNTER — Other Ambulatory Visit: Payer: Self-pay

## 2018-09-30 VITALS — BP 101/59 | HR 101 | Resp 12 | Ht 70.0 in | Wt 188.0 lb

## 2018-09-30 DIAGNOSIS — I2510 Atherosclerotic heart disease of native coronary artery without angina pectoris: Secondary | ICD-10-CM | POA: Diagnosis not present

## 2018-09-30 DIAGNOSIS — I120 Hypertensive chronic kidney disease with stage 5 chronic kidney disease or end stage renal disease: Secondary | ICD-10-CM | POA: Diagnosis not present

## 2018-09-30 DIAGNOSIS — N186 End stage renal disease: Secondary | ICD-10-CM | POA: Diagnosis not present

## 2018-09-30 DIAGNOSIS — Z992 Dependence on renal dialysis: Secondary | ICD-10-CM | POA: Diagnosis not present

## 2018-09-30 DIAGNOSIS — Z79899 Other long term (current) drug therapy: Secondary | ICD-10-CM | POA: Diagnosis not present

## 2018-09-30 DIAGNOSIS — I10 Essential (primary) hypertension: Secondary | ICD-10-CM

## 2018-09-30 DIAGNOSIS — I4821 Permanent atrial fibrillation: Secondary | ICD-10-CM | POA: Diagnosis not present

## 2018-09-30 DIAGNOSIS — T829XXS Unspecified complication of cardiac and vascular prosthetic device, implant and graft, sequela: Secondary | ICD-10-CM

## 2018-09-30 NOTE — Progress Notes (Signed)
MRN : [id number]  Jeremy [name] is a 63 y.o. (03/25/1956) male who presents with chief complaint of  Chief Complaint  Patient presents with  . Follow-up  .  History of Present Illness:   The patient returns to the office for followup of their dialysis access. The function of the access has been stable. The patient denies increased bleeding time or increased recirculation. Patient denies difficulty with cannulation. The patient denies hand pain or other symptoms consistent with steal phenomena.  No significant arm swelling.  The patient denies redness or swelling at the access site. The patient denies fever or chills at home or while on dialysis.  The patient denies amaurosis fugax or recent TIA symptoms. There are no recent neurological changes noted. The patient denies claudication symptoms or rest pain symptoms. The patient denies history of DVT, PE or superficial thrombophlebitis. The patient denies recent episodes of angina or shortness of breath.   Duplex ultrasound of the AV access shows a patent access.  The previously noted stenosis is unchanged compared to last study.       Current Meds  Medication Sig  . acetaminophen (TYLENOL) 500 MG tablet Take 1,000 mg 2 (two) times daily as needed by mouth for moderate pain.  Marland Kitchen allopurinol (ZYLOPRIM) 300 MG tablet Take 300 mg by mouth daily.  Marland Kitchen amiodarone (PACERONE) 200 MG tablet Take 1 tablet (200 mg total) by mouth 2 (two) times daily.  Marland Kitchen aspirin EC 81 MG EC tablet Take 1 tablet (81 mg total) by mouth daily.  . cinacalcet (SENSIPAR) 30 MG tablet Take 30 mg by mouth daily.   . Darbepoetin Alfa (ARANESP) 100 MCG/0.5ML SOSY injection Inject 0.5 mLs (100 mcg total) into the vein every Monday with hemodialysis.  Marland Kitchen Dextrose-Fructose-Sod Citrate (NAUZENE PO) Take 1-2 tablets 2 (two) times daily as needed by mouth (nausea).  Marland Kitchen FLUoxetine (PROZAC) 10 MG capsule TAKE 1 CAPSULE BY MOUTH EVERY DAY  . lidocaine-prilocaine (EMLA) cream Apply 1  application topically as needed (for fistula).  . metoprolol tartrate (LOPRESSOR) 25 MG tablet Take 0.5 tablets (12.5 mg total) by mouth 2 (two) times daily.  . midodrine (PROAMATINE) 10 MG tablet Take 10 mg by mouth See admin instructions. Take on Monday, Wednesday and Friday before dialysis  . omeprazole (PRILOSEC) 20 MG capsule Take 20 mg by mouth daily.  . predniSONE (DELTASONE) 5 MG tablet Take 5 mg by mouth daily with breakfast.   . rosuvastatin (CRESTOR) 10 MG tablet Take 1 tablet (10 mg total) by mouth daily at 6 PM.  . sevelamer carbonate (RENVELA) 800 MG tablet Take 40,000 mg by mouth 3 (three) times daily with meals. 5 tabs per meal  . warfarin (COUMADIN) 1 MG tablet Take 1 tablet (1 mg total) by mouth daily at 6 PM. (Patient taking differently: Take 1-2 mg by mouth See admin instructions. 1 mg all days , except 2 mg on Sunday)    Past Medical History:  Diagnosis Date  . 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 Aortic stenosis    a. 2014 s/p mech AVR ([facility])-->chronic coumadin; b. 08/2018 Echo: Mild AS/AI. Nl fxn/gradient.  . Arthritis   . CAD (coronary artery disease)    a. 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 02/2018 MV [facility]): large inf, infsept, inflat infarct w/ min rev, EF 30%; c. 06/2018 Cath: LM min irregs, LAD 60p, 95/30/76m, LCX min irregs, RCA 40p,  63m, 90d, RPAV 90, RPDA 95ost-->pending CABG (delayed 2/2 Covid).  . Chronic anticoagulation 06/2012   a. Coumadin in the setting of mech AVR and afib.  . Dialysis patient [facility]) 04/07/2006   Patient started HD around 1996,  had 1st transplant LLQ around 1998 until 2007 when they found renal cell cancer in transplant and both native kidneys, all were removed > went back on HD until 2nd transplant RLQ in 2014 which lasted 3 yrs; it was embolized for suspected acute rejection. Is currently on HD MWF in [place] w/ [facility] nephrology.  . ESRD (end stage renal  disease) ([place])    a. 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 1999 s/p Kidney transplant; c. 2014 Bilat nephrectomy (transplanted kidney resected) in the setting of renal cell carcinoma; d. PD until 11/2017-->HD.  Marland Kitchen HFrEF (heart failure with reduced ejection fraction) ([place])    a. 07/2016 Echo: EF 55-60%; b. 02/2018 MV: EF 30%; c. 04/2018 Echo: EF 35%; d. 08/2018 Echo: EF 30-35%. Glob HK w/o rwma. Mildly reduced RV fxn. Mod to sev dil LA. Nl AoV fxn.  Marland Kitchen Hx of colonoscopy    2009 by Dr. Laural Golden. Normal except for small submucosal lipoma. No evidence of polyps or colitis.  . Hyperlipidemia   . Hypertension   . Hypogonadism in male 07/25/2015  . Ischemic cardiomyopathy    a. 07/2016 Echo: EF 55-60%; b. 02/2018 MV: EF 30%; c. 04/2018 Echo: EF 35%; d. 08/2018 Echo: EF 30-35%.  . OSA (obstructive sleep apnea)    No CPAP  . Permanent atrial fibrillation    a. CHA2DS2VASc = 3-->Coumadin (also mech AVR); b. 06/2018 Amio started 2/2 elevated rates.  . Renal cell carcinoma ([place]) 08/04/2016    Past Surgical History:  Procedure Laterality Date  . AORTIC VALVE REPLACEMENT  3 /11 /2014   At [place]    . CAPD INSERTION N/A 02/19/2017   Procedure: LAPAROSCOPIC INSERTION CONTINUOUS AMBULATORY PERITONEAL DIALYSIS  (CAPD) CATHETER;  Surgeon: Algernon Huxley, MD;  Location: [facility];  Service: General;  Laterality: N/A;  . CORONARY ANGIOPLASTY WITH STENT PLACEMENT    . CORONARY ARTERY BYPASS GRAFT N/A 09/16/2018   Procedure: CORONARY ARTERY BYPASS GRAFT times three      with left internal mammary artery and endoharvest of right leg greater saphenous vein;  Surgeon: Melrose Nakayama, MD;  Location: [place];  Service: Open Heart Surgery;  Laterality: N/A;  . DG AV DIALYSIS  SHUNT ACCESS EXIST*L* OR     left arm  . EPIGASTRIC HERNIA REPAIR N/A 02/19/2017   Procedure: HERNIA REPAIR EPIGASTRIC ADULT;  Surgeon: Robert Bellow, MD;  Location: [facility];  Service: General;   Laterality: N/A;  . EYE SURGERY     bilateral cataract  . HERNIA REPAIR     UMBILICAL HERNIA  . HERNIA REPAIR  02/19/2017  . INNER EAR SURGERY     20 years ago  . KIDNEY TRANSPLANT    . Peritoneal Dialysis access placement  02/19/2017  . REMOVAL OF A DIALYSIS CATHETER N/A 11/23/2017   Procedure: REMOVAL OF A PERITONEAL DIALYSIS CATHETER;  Surgeon: Algernon Huxley, MD;  Location: [facility];  Service: Vascular;  Laterality: N/A;  . RIGHT/LEFT HEART CATH AND CORONARY ANGIOGRAPHY Bilateral 06/07/2018   Procedure: RIGHT/LEFT HEART CATH AND CORONARY ANGIOGRAPHY;  Surgeon: Wellington Hampshire, MD;  Location: [place] CV [facility];  Service: Cardiovascular;  Laterality: Bilateral;  . TEE WITHOUT CARDIOVERSION N/A  07/21/2016   Procedure: TRANSESOPHAGEAL ECHOCARDIOGRAM (TEE);  Surgeon: Wellington Hampshire, MD;  Location: [facility];  Service: Cardiovascular;  Laterality: N/A;  . TEE WITHOUT CARDIOVERSION N/A 09/16/2018   Procedure: TRANSESOPHAGEAL ECHOCARDIOGRAM (TEE);  Surgeon: Melrose Nakayama, MD;  Location: [place];  Service: Open Heart Surgery;  Laterality: N/A;    Social History Social History   Tobacco Use  . Smoking status: Never Smoker  . Smokeless tobacco: Never Used  Substance Use Topics  . Alcohol use: No  . Drug use: No    Family History Family History  Problem Relation Age of Onset  . Cancer Mother        pancreatic  . Heart disease Father   . Diabetes Father     Allergies  Allergen Reactions  . Statins     Other reaction(s): Arthralgias (intolerance), Myalgias (intolerance)  Pt taking pravastatin   . Sertraline Rash     REVIEW OF SYSTEMS (Negative unless checked)  Constitutional: [] Weight loss  [] Fever  [] Chills Cardiac: [x] Chest pain   [] Chest pressure   [] Palpitations   [] Shortness of breath when laying flat   [x] Shortness of breath with exertion. Vascular:  [] Pain in legs with walking   [] Pain in legs at rest  [] History of DVT   [] Phlebitis   [] Swelling in legs    [] Varicose veins   [] Non-healing ulcers Pulmonary:   [] Uses home oxygen   [] Productive cough   [] Hemoptysis   [] Wheeze  [] COPD   [] Asthma Neurologic:  [] Dizziness   [] Seizures   [] History of stroke   [] History of TIA  [] Aphasia   [] Vissual changes   [] Weakness or numbness in arm   [] Weakness or numbness in leg Musculoskeletal:   [] Joint swelling   [] Joint pain   [] Low back pain Hematologic:  [] Easy bruising  [] Easy bleeding   [] Hypercoagulable state   [] Anemic Gastrointestinal:  [] Diarrhea   [] Vomiting  [] Gastroesophageal reflux/heartburn   [] Difficulty swallowing. Genitourinary:  [x] Chronic kidney disease   [] Difficult urination  [] Frequent urination   [] Blood in urine Skin:  [] Rashes   [] Ulcers  Psychological:  [] History of anxiety   []  History of major depression.  Physical Examination  Vitals:   09/30/18 0957  BP: (!) 101/59  Pulse: (!) 101  Resp: 12  Weight: 188 lb (85.3 kg)  Height: 5\' 10"  (1.778 m)   Body mass index is 26.98 kg/m. Gen: WD/WN, NAD Head: [place]/AT, No temporalis wasting.  Ear/Nose/Throat: Hearing grossly intact, nares w/o erythema or drainage Eyes: PER, EOMI, sclera nonicteric.  Neck: Supple, no large masses.   Pulmonary:  Good air movement, no audible wheezing bilaterally, no use of accessory muscles.  Cardiac: RRR, no JVD Vascular: Left wrist fistula good thrill mild pulsatility good bruit.  Moderate aneurysmal deterioration skin appears healthy and intact Vessel Right Left  Radial Palpable Palpable  Brachial Palpable Palpable  Gastrointestinal: Non-distended. No guarding/no peritoneal signs.  Musculoskeletal: M/S 5/5 throughout.  No deformity or atrophy.  Neurologic: CN 2-12 intact. Symmetrical.  Speech is fluent. Motor exam as listed above. Psychiatric: Judgment intact, Mood & affect appropriate for pt's clinical situation. Dermatologic: No rashes or ulcers noted.  No changes consistent with cellulitis. Lymph : No lichenification or skin changes of chronic  lymphedema.  CBC Lab Results  Component Value Date   WBC 6.8 09/22/2018   HGB 8.4 (L) 09/22/2018   HCT 26.1 (L) 09/22/2018   MCV 108.8 (H) 09/22/2018   PLT 121 (L) 09/22/2018    BMET    Component Value  Date/Time   NA 138 09/22/2018 0804   NA 139 11/28/2011   K 4.0 09/22/2018 0804   CL 100 09/22/2018 0804   CO2 24 09/22/2018 0804   GLUCOSE 128 (H) 09/22/2018 0804   BUN 31 (H) 09/22/2018 0804   BUN 35 (A) 12/06/2011   CREATININE 7.61 (H) 09/22/2018 0804   CALCIUM 8.2 (L) 09/22/2018 0804   CALCIUM 9.2 12/14/2015 1059   GFRNONAA 7 (L) 09/22/2018 0804   GFRAA 8 (L) 09/22/2018 0804   Estimated Creatinine Clearance: 10.5 mL/min (A) (by C-G formula based on SCr of 7.61 mg/dL (H)).  COAG Lab Results  Component Value Date   INR 1.7 (A) 09/27/2018   INR 1.7 (A) 09/26/2018   INR 2.1 (H) 09/22/2018    Radiology Dg Chest 2 View  Result Date: 09/21/2018 CLINICAL DATA:  Chest pain status post cardiac surgery. EXAM: CHEST - 2 VIEW COMPARISON:  09/20/2018 FINDINGS: The right-sided central venous catheter has been removed. The patient is status post prior median sternotomy. The heart size is enlarged. There arm proving bibasilar airspace opacities. There is no pneumothorax. There is no acute osseous abnormality. The sternotomy wires appear intact. There is a small amount of pneumomediastinum, likely related to the recent surgical intervention. There are old healed right-sided rib fractures. IMPRESSION: 1. Interval removal of the right-sided central venous catheter. 2. Stable cardiomegaly. 3. Persistent but improving bibasilar airspace opacities favored to represent a combination of both pleural fluid and atelectasis. 4. Anterior pneumomediastinum, likely related to the recent surgical intervention. Electronically Signed   By: Constance Holster M.D.   On: 09/21/2018 15:40   Dg Chest 2 View  Result Date: 09/14/2018 CLINICAL DATA:  Preop CABG history of aortic valve EXAM: CHEST - 2 VIEW  COMPARISON:  07/17/2016 FINDINGS: Post sternotomy changes and valve replacement. No acute airspace disease or pleural effusion. Mild cardiomegaly without interval change. Aortic atherosclerosis. No pneumothorax. IMPRESSION: No active cardiopulmonary disease.  Mild cardiomegaly. Electronically Signed   By: Donavan Foil M.D.   On: 09/14/2018 21:41   Dg Chest Port 1 View  Result Date: 09/20/2018 CLINICAL DATA:  Chest soreness, status post CABG surgery. Chest tube in place. EXAM: PORTABLE CHEST 1 VIEW COMPARISON:  09/18/2018. FINDINGS: Right IJ catheter sheath remains in place. Heart is enlarged, stable. Mild diffuse interstitial prominence with left basilar airspace consolidation. Probable small left pleural effusion. Bibasilar subsegmental atelectasis and probable tiny right pleural effusion. Biapical pleural thickening. Old rib fractures. IMPRESSION: 1. Question mild pulmonary edema. 2. Persistent left basilar airspace opacification which may be due to atelectasis. 3. Suspect small bilateral pleural effusions. Electronically Signed   By: Lorin Picket M.D.   On: 09/20/2018 07:58   Dg Chest Port 1 View  Result Date: 09/18/2018 CLINICAL DATA:  Status post CABG EXAM: PORTABLE CHEST 1 VIEW COMPARISON:  September 17, 2018 FINDINGS: The PA catheter is been removed and a right IJ sheath remains. No pneumothorax. Chest tubes have been removed. The right lung is clear. Opacity remains in the left base, probably atelectasis and a small effusion. No change in cardiomegaly. No other changes. IMPRESSION: 1. Support apparatus as above. 2. Persistent mild opacity in left base favored represent atelectasis with a small effusion. 3. No other acute abnormalities. Electronically Signed   By: Dorise Bullion III M.D   On: 09/18/2018 09:59   Dg Chest Port 1 View  Result Date: 09/17/2018 CLINICAL DATA:  Status post cardiac surgery with chest pain, initial encounter EXAM: PORTABLE CHEST 1 VIEW COMPARISON:  09/16/2018 FINDINGS:  Postsurgical changes are again seen. Endotracheal tube and gastric catheter have been removed in the interval. Mediastinal drain and left thoracostomy catheter are noted as well as a pericardial drain. Swan-Ganz catheter is noted in the pulmonary outflow tract. Lungs are well aerated bilaterally. Mild left basilar atelectasis is seen. IMPRESSION: Tubes and lines as described above. Mild stable basilar atelectasis. Electronically Signed   By: Inez Catalina M.D.   On: 09/17/2018 07:46   Dg Chest Port 1 View  Result Date: 09/16/2018 CLINICAL DATA:  Bypass surgery. EXAM: PORTABLE CHEST 1 VIEW COMPARISON:  09/14/2018 FINDINGS: The endotracheal tube is 6.4 cm above the carina. The right IJ Swan-Ganz catheter tip is in the proximal right pulmonary artery. The NG tube is coursing down the esophagus and into the stomach. Left-sided chest tube in good position without definite pneumothorax. Mediastinal drain tubes and epicardial leads are noted. Minimal streaky left basilar atelectasis but no infiltrates, edema or effusions. IMPRESSION: 1. Postoperative support apparatus in good position without complicating features. 2. Minimal basilar atelectasis but no edema, effusions or pneumothorax. Electronically Signed   By: Marijo Sanes M.D.   On: 09/16/2018 14:20   Vas US Doppler Pre Cabg  Result Date: 09/16/2018 PREOPERATIVE VASCULAR EVALUATION  Indications:      Pre-surgical evaluation. Risk Factors:     Hypertension, hyperlipidemia, coronary artery disease. Comparison Study: No prior study. Performing Technologist: Maudry Mayhew MHA, RVT, RDCS, RDMS  Examination Guidelines: A complete evaluation includes B-mode imaging, spectral Doppler, color Doppler, and power Doppler as needed of all accessible portions of each vessel. Bilateral testing is considered an integral part of a complete examination. Limited examinations for reoccurring indications may be performed as noted.  Right Carotid Findings:  +----------+-------+-------+--------+---------------------------------+--------+           PSV    EDV    StenosisDescribe                         Comments           cm/s   cm/s                                                     +----------+-------+-------+--------+---------------------------------+--------+ CCA Prox  71     13                                                       +----------+-------+-------+--------+---------------------------------+--------+ CCA Distal61     13             calcific and heterogenous                 +----------+-------+-------+--------+---------------------------------+--------+ ICA Prox  78     19             heterogenous, irregular and                                               calcific                                  +----------+-------+-------+--------+---------------------------------+--------+  ICA Distal97     23                                                       +----------+-------+-------+--------+---------------------------------+--------+ ECA       76                    calcific and heterogenous                 +----------+-------+-------+--------+---------------------------------+--------+ Portions of this table do not appear on this page. +----------+--------+-------+----------------+------------+           PSV cm/sEDV cmsDescribe        Arm Pressure +----------+--------+-------+----------------+------------+ Subclavian78             Multiphasic, [id number]          +----------+--------+-------+----------------+------------+ +---------+--------+--+--------+-+---------+ VertebralPSV cm/s37EDV cm/s9Antegrade +---------+--------+--+--------+-+---------+ Left Carotid Findings: +----------+-------+-------+--------+---------------------------------+--------+           PSV    EDV    StenosisDescribe                         Comments           cm/s   cm/s                                                      +----------+-------+-------+--------+---------------------------------+--------+ CCA Prox  140    19                                                       +----------+-------+-------+--------+---------------------------------+--------+ CCA Distal86     17             heterogenous, smooth and calcific         +----------+-------+-------+--------+---------------------------------+--------+ ICA Prox  169    44             irregular, heterogenous and                                               calcific                                  +----------+-------+-------+--------+---------------------------------+--------+ ICA Distal61     15                                                       +----------+-------+-------+--------+---------------------------------+--------+ ECA       79     6              calcific                                  +----------+-------+-------+--------+---------------------------------+--------+ +----------+--------+--------+----------------+------------+  SubclavianPSV cm/sEDV cm/sDescribe        Arm Pressure +----------+--------+--------+----------------+------------+           195             Multiphasic, WNL             +----------+--------+--------+----------------+------------+ +---------+--------+--+--------+--+---------+ VertebralPSV cm/s71EDV cm/s20Antegrade +---------+--------+--+--------+--+---------+  ABI Findings: +---------+------------------+-----+----------+-----------------------+ Right    Rt Pressure (mmHg)IndexWaveform  Comment                 +---------+------------------+-----+----------+-----------------------+ Brachial 127                    triphasic                         +---------+------------------+-----+----------+-----------------------+ PTA      Noncompressible        biphasic                           +---------+------------------+-----+----------+-----------------------+ DP       Noncompressible        monophasic                        +---------+------------------+-----+----------+-----------------------+ Great Toe                                 No discernable waveform +---------+------------------+-----+----------+-----------------------+ +---------+------------------+-----+----------+-----------------------+ Left     Lt Pressure (mmHg)IndexWaveform  Comment                 +---------+------------------+-----+----------+-----------------------+ PTA                             monophasic                        +---------+------------------+-----+----------+-----------------------+ DP                              monophasic                        +---------+------------------+-----+----------+-----------------------+ Great Toe                                 No discernable waveform +---------+------------------+-----+----------+-----------------------+  Right Doppler Findings: +-----------+--------+-----+---------+-----------------------------------------+ Site       PressureIndexDoppler  Comments                                  +-----------+--------+-----+---------+-----------------------------------------+ Brachial   127          triphasic                                          +-----------+--------+-----+---------+-----------------------------------------+ Radial                  triphasic                                          +-----------+--------+-----+---------+-----------------------------------------+ Ulnar  triphasic                                          +-----------+--------+-----+---------+-----------------------------------------+ Palmar Arch                      Signal obliterates with radial                                             compression, is unaffected with ulnar                                       compression.                              +-----------+--------+-----+---------+-----------------------------------------+  Technologist Notes: Left upper extremity not evaluated due to presence of AVF.  Summary: Right Carotid: Velocities in the right ICA are consistent with a 1-39% stenosis. Left Carotid: Velocities in the left ICA are consistent with a 40-59% stenosis. Vertebrals:  Bilateral vertebral arteries demonstrate antegrade flow. Subclavians: Normal flow hemodynamics were seen in bilateral subclavian              arteries. Right ABI: Resting right ankle-brachial index indicates noncompressible right lower extremity arteries, suggestive of medical arterial calcifications. Waveforms suggest proximal obstruction. Unable to calculate toe-brachial index due to absent great toe waveforms. Left ABI: Resting left ankle-brachial index indicates noncompressible left lower extremity arteries, suggestive of medical arterial calcifications. Waveforms suggest proximal obstruction. Unable to calculate toe-brachial index due to absent great toe waveforms.  Electronically signed by Servando Snare MD on 09/16/2018 at 5:32:48 PM.    Final      Assessment/Plan 1. Complication of vascular access for dialysis, sequela Recommend:  The patient is doing well and currently has adequate dialysis access. The patient's dialysis center is not reporting any access issues. Flow pattern is stable when compared to the prior ultrasound.  The patient should have a duplex ultrasound of the dialysis access in 6 months.  The patient will follow-up with me in the office after each ultrasound   - VAS [name] (AVF, AVG); Future  2. ESRD on dialysis [facility]) Continue dialysis without interruption  3. Coronary artery disease involving native coronary artery of native heart without angina pectoris Continue cardiac and antihypertensive medications as already ordered and reviewed, no changes at this time.   Continue statin as ordered and reviewed, no changes at this time  Nitrates PRN for chest pain   4. Essential hypertension Continue antihypertensive medications as already ordered, these medications have been reviewed and there are no changes at this time.   5. Permanent atrial fibrillation Continue antiarrhythmia medications as already ordered, these medications have been reviewed and there are no changes at this time.  Continue anticoagulation as ordered by Cardiology Service     Hortencia Pilar, MD  09/30/2018 10:54 AM

## 2018-10-01 ENCOUNTER — Telehealth: Payer: Self-pay | Admitting: Family Medicine

## 2018-10-01 ENCOUNTER — Encounter: Payer: Self-pay | Admitting: *Deleted

## 2018-10-01 ENCOUNTER — Encounter (INDEPENDENT_AMBULATORY_CARE_PROVIDER_SITE_OTHER): Payer: Self-pay | Admitting: Vascular Surgery

## 2018-10-01 DIAGNOSIS — T829XXA Unspecified complication of cardiac and vascular prosthetic device, implant and graft, initial encounter: Secondary | ICD-10-CM | POA: Insufficient documentation

## 2018-10-01 DIAGNOSIS — N186 End stage renal disease: Secondary | ICD-10-CM | POA: Diagnosis not present

## 2018-10-01 DIAGNOSIS — Z23 Encounter for immunization: Secondary | ICD-10-CM | POA: Diagnosis not present

## 2018-10-01 DIAGNOSIS — N2581 Secondary hyperparathyroidism of renal origin: Secondary | ICD-10-CM | POA: Diagnosis not present

## 2018-10-01 DIAGNOSIS — D631 Anemia in chronic kidney disease: Secondary | ICD-10-CM | POA: Diagnosis not present

## 2018-10-01 DIAGNOSIS — E8779 Other fluid overload: Secondary | ICD-10-CM | POA: Diagnosis not present

## 2018-10-01 NOTE — Telephone Encounter (Signed)
I called [facility] and they stated that you can fax  a script  Or letterhead stating that you no longer manage the patient's INR. Fax# [phone].  Musa Rewerts,cma

## 2018-10-01 NOTE — Telephone Encounter (Signed)
I continue to receive INR results from [facility] for this patient. He has transitioned this to his cardiology office. Please contact [facility] and inform them that I am no longer managing his INR or coumadin and that they do not need to fax his results to me any more and should be faxing the results to his cardiology office. Thanks.

## 2018-10-03 NOTE — Telephone Encounter (Signed)
Letter created.  Please print this letter and place it in my folder to sign and then we can fax it.  Thanks.

## 2018-10-04 DIAGNOSIS — E8779 Other fluid overload: Secondary | ICD-10-CM | POA: Diagnosis not present

## 2018-10-04 DIAGNOSIS — N186 End stage renal disease: Secondary | ICD-10-CM | POA: Diagnosis not present

## 2018-10-04 DIAGNOSIS — N2581 Secondary hyperparathyroidism of renal origin: Secondary | ICD-10-CM | POA: Diagnosis not present

## 2018-10-04 DIAGNOSIS — D631 Anemia in chronic kidney disease: Secondary | ICD-10-CM | POA: Diagnosis not present

## 2018-10-04 DIAGNOSIS — Z23 Encounter for immunization: Secondary | ICD-10-CM | POA: Diagnosis not present

## 2018-10-04 LAB — POCT INR: INR: 3.5 — AB (ref <=1.1)

## 2018-10-05 ENCOUNTER — Telehealth: Payer: Self-pay

## 2018-10-05 ENCOUNTER — Other Ambulatory Visit: Payer: Self-pay

## 2018-10-05 ENCOUNTER — Encounter: Payer: Medicare Other | Admitting: Nurse Practitioner

## 2018-10-05 ENCOUNTER — Encounter: Payer: Self-pay | Admitting: Nurse Practitioner

## 2018-10-05 ENCOUNTER — Other Ambulatory Visit: Payer: Self-pay | Admitting: Family Medicine

## 2018-10-05 DIAGNOSIS — T8610 Unspecified complication of kidney transplant: Secondary | ICD-10-CM | POA: Diagnosis not present

## 2018-10-05 DIAGNOSIS — N186 End stage renal disease: Secondary | ICD-10-CM | POA: Diagnosis not present

## 2018-10-05 DIAGNOSIS — Z992 Dependence on renal dialysis: Secondary | ICD-10-CM | POA: Diagnosis not present

## 2018-10-05 NOTE — Telephone Encounter (Signed)

## 2018-10-06 ENCOUNTER — Telehealth: Payer: Self-pay

## 2018-10-06 ENCOUNTER — Ambulatory Visit (INDEPENDENT_AMBULATORY_CARE_PROVIDER_SITE_OTHER): Payer: Medicare Other

## 2018-10-06 DIAGNOSIS — Z952 Presence of prosthetic heart valve: Secondary | ICD-10-CM | POA: Diagnosis not present

## 2018-10-06 DIAGNOSIS — I4821 Permanent atrial fibrillation: Secondary | ICD-10-CM | POA: Diagnosis not present

## 2018-10-06 DIAGNOSIS — Z5181 Encounter for therapeutic drug level monitoring: Secondary | ICD-10-CM | POA: Diagnosis not present

## 2018-10-06 DIAGNOSIS — D631 Anemia in chronic kidney disease: Secondary | ICD-10-CM | POA: Diagnosis not present

## 2018-10-06 DIAGNOSIS — N2581 Secondary hyperparathyroidism of renal origin: Secondary | ICD-10-CM | POA: Diagnosis not present

## 2018-10-06 DIAGNOSIS — E875 Hyperkalemia: Secondary | ICD-10-CM | POA: Diagnosis not present

## 2018-10-06 DIAGNOSIS — N186 End stage renal disease: Secondary | ICD-10-CM | POA: Diagnosis not present

## 2018-10-06 DIAGNOSIS — D509 Iron deficiency anemia, unspecified: Secondary | ICD-10-CM | POA: Diagnosis not present

## 2018-10-06 LAB — POCT INR: INR: 3.5 — AB (ref 2.0–3.0)

## 2018-10-06 NOTE — Telephone Encounter (Signed)
Please see anti-coag note for today. 

## 2018-10-06 NOTE — Patient Instructions (Signed)
Left message on pt's vm advising him to have a serving of greens today and continue taking 1 mg every day except 2mg  on Sundays. Amiodarone started 08/05/18. Recheck INR in 1 week. Home INR monitor ~0.2 off compared to lab check historically.

## 2018-10-06 NOTE — Progress Notes (Signed)
Erroneous - pt rescheduled.

## 2018-10-06 NOTE — Telephone Encounter (Signed)
Patient called in with INR reading  6/29  3.5  Patient would like a call back  Thank you

## 2018-10-07 ENCOUNTER — Encounter: Payer: Self-pay | Admitting: Nurse Practitioner

## 2018-10-07 ENCOUNTER — Other Ambulatory Visit: Payer: Self-pay

## 2018-10-07 ENCOUNTER — Ambulatory Visit (INDEPENDENT_AMBULATORY_CARE_PROVIDER_SITE_OTHER): Payer: Medicare Other | Admitting: Nurse Practitioner

## 2018-10-07 VITALS — BP 128/60 | HR 72 | Ht 70.0 in | Wt 187.0 lb

## 2018-10-07 DIAGNOSIS — I2510 Atherosclerotic heart disease of native coronary artery without angina pectoris: Secondary | ICD-10-CM

## 2018-10-07 DIAGNOSIS — I255 Ischemic cardiomyopathy: Secondary | ICD-10-CM

## 2018-10-07 DIAGNOSIS — I10 Essential (primary) hypertension: Secondary | ICD-10-CM | POA: Diagnosis not present

## 2018-10-07 DIAGNOSIS — I4821 Permanent atrial fibrillation: Secondary | ICD-10-CM

## 2018-10-07 DIAGNOSIS — Z952 Presence of prosthetic heart valve: Secondary | ICD-10-CM | POA: Diagnosis not present

## 2018-10-07 DIAGNOSIS — N186 End stage renal disease: Secondary | ICD-10-CM | POA: Diagnosis not present

## 2018-10-07 DIAGNOSIS — E785 Hyperlipidemia, unspecified: Secondary | ICD-10-CM | POA: Diagnosis not present

## 2018-10-07 DIAGNOSIS — I5022 Chronic systolic (congestive) heart failure: Secondary | ICD-10-CM | POA: Diagnosis not present

## 2018-10-07 MED ORDER — AMIODARONE HCL 200 MG PO TABS: 200.0000 mg | ORAL_TABLET | Freq: Every day | ORAL | 3 refills | Status: DC

## 2018-10-07 NOTE — Patient Instructions (Signed)
Medication Instructions:  Your physician has recommended you make the following change in your medication:  1- DECREASE Amiodarone to Take 1 tablet (200 mg total) by mouth daily.  If you need a refill on your cardiac medications before your next appointment, please call your pharmacy.   Lab work: None ordered  If you have labs (blood work) drawn today and your tests are completely normal, you will receive your results only by: Marland Kitchen MyChart Message (if you have MyChart) OR . A paper copy in the mail If you have any lab test that is abnormal or we need to change your treatment, we will call you to review the results.  Testing/Procedures: None ordered   Follow-Up: At [facility], you and your health needs are our priority.  As part of our continuing mission to provide you with exceptional heart care, we have created designated Provider Care Teams.  These Care Teams include your primary Cardiologist (physician) and Advanced Practice Providers (APPs -  Physician Assistants and Nurse Practitioners) who all work together to provide you with the care you need, when you need it. You will need a follow up appointment in 6-8 weeks.  You may see Kathlyn Sacramento, MD or Murray Hodgkins, NP.

## 2018-10-07 NOTE — Progress Notes (Signed)
Office Visit    Patient Name: Jeremy [name] Date of Encounter: 10/07/2018  Primary Care Provider:  Leone Haven, MD Primary Cardiologist:  Kathlyn Sacramento, MD  Chief Complaint    63 year old male with a history of coronary artery disease, aortic stenosis status post mechanical AVR (chronic Coumadin), permanent atrial fibrillation on amiodarone, HFrEF with an EF of 30 to 35%, ischemic cardiomyopathy, IgA nephropathy status post transplant and renal cell carcinoma status post bilateral nephrectomy  ESRD on HD, hypertension, hyperlipidemia, sleep apnea, and anemia of chronic disease, who presents for follow-up status post CABG.  Past Medical History    Past Medical History:  Diagnosis Date   Anemia in chronic kidney disease 07/04/2015   Aortic stenosis    a. 2014 s/p mech AVR ([facility])-->chronic coumadin; b. 08/2018 Echo: Mild AS/AI. Nl fxn/gradient.   Arthritis    CAD (coronary artery disease)    a. 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 02/2018 MV [facility]): large inf, infsept, inflat infarct w/ min rev, EF 30%; c. 06/2018 Cath: LM min irregs, LAD 60p, 95/30/55m, LCX min irregs, RCA 40p, 73m, 90d, RPAV 90, RPDA 95ost; c. 09/2018 CABGx3 (LIMA->LAD, VG->D1, VG->PDA).   Chronic anticoagulation 06/2012   a. Coumadin in the setting of mech AVR and afib.   Dialysis patient [facility]) 04/07/2006   Patient started HD around 1996,  had 1st transplant LLQ around 1998 until 2007 when they found renal cell cancer in transplant and both native kidneys, all were removed > went back on HD until 2nd transplant RLQ in 2014 which lasted 3 yrs; it was embolized for suspected acute rejection. Is currently on HD MWF in [place] w/ [facility] nephrology.   ESRD (end stage renal disease) ([place])    a. 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 1999 s/p Kidney transplant; c. 2014 Bilat nephrectomy (transplanted kidney resected) in the setting of renal cell  carcinoma; d. PD until 11/2017-->HD.   HFrEF (heart failure with reduced ejection fraction) ([place])    a. 07/2016 Echo: EF 55-60%; b. 02/2018 MV: EF 30%; c. 04/2018 Echo: EF 35%; d. 08/2018 Echo: EF 30-35%. Glob HK w/o rwma. Mildly reduced RV fxn. Mod to sev dil LA. Nl AoV fxn.   Hx of colonoscopy    2009 by Dr. Laural Golden. Normal except for small submucosal lipoma. No evidence of polyps or colitis.   Hyperlipidemia    Hypertension    Hypogonadism in male 07/25/2015   Ischemic cardiomyopathy    a. 07/2016 Echo: EF 55-60%; b. 02/2018 MV: EF 30%; c. 04/2018 Echo: EF 35%; d. 08/2018 Echo: EF 30-35%.   OSA (obstructive sleep apnea)    No CPAP   Permanent atrial fibrillation    a. CHA2DS2VASc = 3-->Coumadin (also mech AVR); b. 06/2018 Amio started 2/2 elevated rates.   Renal cell carcinoma ([place]) 08/04/2016   Past Surgical History:  Procedure Laterality Date   AORTIC VALVE REPLACEMENT  3 /11 /2014   At [place]     CAPD INSERTION N/A 02/19/2017   Procedure: LAPAROSCOPIC INSERTION CONTINUOUS AMBULATORY PERITONEAL DIALYSIS  (CAPD) CATHETER;  Surgeon: Algernon Huxley, MD;  Location: [facility] ORS;  Service: General;  Laterality: N/A;   CORONARY ANGIOPLASTY WITH STENT PLACEMENT     CORONARY ARTERY BYPASS GRAFT N/A 09/16/2018   Procedure: CORONARY ARTERY BYPASS GRAFT times three      with  left internal mammary artery and endoharvest of right leg greater saphenous vein;  Surgeon: Melrose Nakayama, MD;  Location: [place];  Service: Open Heart Surgery;  Laterality: N/A;   DG AV DIALYSIS  SHUNT ACCESS EXIST*L* OR     left arm   EPIGASTRIC HERNIA REPAIR N/A 02/19/2017   Procedure: HERNIA REPAIR EPIGASTRIC ADULT;  Surgeon: Robert Bellow, MD;  Location: [facility] ORS;  Service: General;  Laterality: N/A;   EYE SURGERY     bilateral cataract   HERNIA REPAIR     UMBILICAL HERNIA   HERNIA REPAIR  02/19/2017   INNER EAR SURGERY     20 years ago   KIDNEY  TRANSPLANT     Peritoneal Dialysis access placement  02/19/2017   REMOVAL OF A DIALYSIS CATHETER N/A 11/23/2017   Procedure: REMOVAL OF A PERITONEAL DIALYSIS CATHETER;  Surgeon: Algernon Huxley, MD;  Location: [facility] OR[facility];  Service: Vascular;  Laterality: N/A;   RIGHT/LEFT HEART CATH AND CORONARY ANGIOGRAPHY Bilateral 06/07/2018   Procedure: RIGHT/LEFT HEART CATH AND CORONARY ANGIOGRAPHY;  Surgeon: Wellington Hampshire, MD;  Location: [place] CV LAB;  Service: Cardiovascular;  Laterality: Bilateral;   TEE WITHOUT CARDIOVERSION N/A 07/21/2016   Procedure: TRANSESOPHAGEAL ECHOCARDIOGRAM (TEE);  Surgeon: Wellington Hampshire, MD;  Location: [facility];  Service: Cardiovascular;  Laterality: N/A;   TEE WITHOUT CARDIOVERSION N/A 09/16/2018   Procedure: TRANSESOPHAGEAL ECHOCARDIOGRAM (TEE);  Surgeon: Melrose Nakayama, MD;  Location: [place];  Service: Open Heart Surgery;  Laterality: N/A;    Allergies  Allergies  Allergen Reactions   Statins     Other reaction(s): Arthralgias (intolerance), Myalgias (intolerance)  Pt taking pravastatin    Sertraline Rash    History of Present Illness    63 year old male with above complex past medical history aortic stenosis status post mechanical AVR (chronic Coumadin), coronary artery disease status post prior drug-eluting stent placement to the LAD in 2013, permanent atrial fibrillation, IgA nephropathy diagnosed at age 55 and status post transplant in 1999 with development of renal cell carcinoma in 2014 requiring bilateral nephrectomy.  He was initially on peritoneal dialysis [facility] since August 2018.  Other history includes hypertension, hyperlipidemia, sleep apnea.  In the setting of worsening angina in November 2019, he underwent stress testing at [facility] which showed a large inferior inferoseptal Nche with minimal reversibility.  EF was 30%, which was new.  Follow-up echo confirmed LV dysfunction with an EF of 35% mildly reduced RV function.  Diagnostic  catheterization March 2020 showed severe LAD RCA/RP AV/RPDA disease and he was subsequently evaluated by CT surgery with recommendation for CABG.  Unfortunately, He was canceled in the setting of the COVID-19 pandemic.  While he awaited bypass, he complained of frequent chest pain tightness with minimal activity.He was placed on amiodarone for improved rate control and this resulted in significant improvement in symptoms and exercise tolerance.  He was finally able to have his bypass surgery on June 11-CABG x3.  He was anemic postoperatively and did require transfusion.  In the setting of chronic hypotension with dialysis, he did require midodrine and evidence-based therapy for heart failure was and has been limited.  He was subsequently discharged on June 17.  Since discharge, he has done well.  He has noted significant improvement in activity tolerance and has been walking around his home as previously advised by cardiac rehab when he was on the inpatient service.  He has had some chest wall pain but only uses Tylenol sparingly.  His  surgical incisions have been healing well.  He denies dyspnea, palpitations, PND, orthopnea, dizziness, syncope, edema, or early satiety.  He has been tolerating dialysis well and has even noted that his blood pressures have been running a little bit higher than previously noted, often in the 1 20-1 30 range.  Home Medications    Prior to Admission medications   Medication Sig Start Date End Date Taking? Authorizing Provider  acetaminophen (TYLENOL) 500 MG tablet Take 1,000 mg 2 (two) times daily as needed by mouth for moderate pain.    [provider]  allopurinol (ZYLOPRIM) 300 MG tablet Take 300 mg by mouth daily. 12/23/17   [provider]  amiodarone (PACERONE) 200 MG tablet Take 1 tablet (200 mg total) by mouth 2 (two) times daily. 08/05/18   Wellington Hampshire, MD  aspirin EC 81 MG EC tablet Take 1 tablet (81 mg total) by mouth daily. 09/23/18   Gold,  Wilder Glade, PA-C  cinacalcet (SENSIPAR) 30 MG tablet Take 30 mg by mouth daily.     [provider]  Darbepoetin Alfa (ARANESP) 100 MCG/0.5ML SOSY injection Inject 0.5 mLs (100 mcg total) into the vein every Monday with hemodialysis. 09/27/18   John Giovanni, PA-C  Dextrose-Fructose-Sod Citrate (NAUZENE PO) Take 1-2 tablets 2 (two) times daily as needed by mouth (nausea).    [provider]  FLUoxetine (PROZAC) 10 MG capsule TAKE 1 CAPSULE BY MOUTH EVERY DAY 10/05/18   Leone Haven, MD  folic acid-vitamin b complex-vitamin c-selenium-zinc (DIALYVITE) 3 MG TABS tablet Take 1 tablet by mouth daily.    [provider]  lidocaine-prilocaine (EMLA) cream Apply 1 application topically as needed (for fistula).    [provider]  metoprolol tartrate (LOPRESSOR) 25 MG tablet Take 0.5 tablets (12.5 mg total) by mouth 2 (two) times daily. 09/22/18   Gold, Wayne E, PA-C  midodrine (PROAMATINE) 10 MG tablet Take 10 mg by mouth See admin instructions. Take on Monday, Wednesday and Friday before dialysis 07/30/18   [provider]  omeprazole (PRILOSEC) 20 MG capsule Take 20 mg by mouth daily.    [provider]  predniSONE (DELTASONE) 5 MG tablet Take 5 mg by mouth daily with breakfast.  10/12/16   [provider]  rosuvastatin (CRESTOR) 10 MG tablet Take 1 tablet (10 mg total) by mouth daily at 6 PM. 09/22/18   Gold, Wilder Glade, PA-C  sevelamer carbonate (RENVELA) 800 MG tablet Take 40,000 mg by mouth 3 (three) times daily with meals. 5 tabs per meal    [provider]  warfarin (COUMADIN) 1 MG tablet Take 1 tablet (1 mg total) by mouth daily at 6 PM. Patient taking differently: Take 1-2 mg by mouth See admin instructions. 1 mg all days , except 2 mg on Sunday 08/05/18   Wellington Hampshire, MD    Review of Systems    As above, doing well since his surgery.  Some chest wall pain but overall not significant.  He denies angina, palpitations, dyspnea,  pnd, orthopnea, n, v, dizziness, syncope, edema, weight gain, or early satiety.  All other systems reviewed and are otherwise negative except as noted above.  Physical Exam    VS:  BP 128/60 (BP Location: Left Arm, Patient Position: Sitting, Cuff Size: Normal)    Pulse 72    Ht 5\' 10"  (1.778 m)    Wt 187 lb (84.8 kg)    BMI 26.83 kg/m  , BMI Body mass index is 26.83  kg/m. GEN: Well nourished, well developed, in no acute distress. HEENT: normal. Neck: Supple, no JVD, carotid bruits, or masses. Cardiac: RRR, mechanical S2, no murmurs, rubs, or gallops. No clubbing, cyanosis, edema.  Radials/DP/PT 2+ and equal bilaterally.  Sternal and right medial thigh incisions healing well without drainage or erythema. Respiratory:  Respirations regular and unlabored, clear to auscultation bilaterally. GI: Soft, nontender, nondistended, BS + x 4.  Small abdominal incisions healing well. MS: no deformity or atrophy. Skin: warm and dry, no rash. Neuro:  Strength and sensation are intact. Psych: Normal affect.  Accessory Clinical Findings    ECG personally reviewed by me today -atrial fibrillation, 72, right bundle branch block- no acute changes.  Lab Results  Component Value Date   WBC 6.8 09/22/2018   HGB 8.4 (L) 09/22/2018   HCT 26.1 (L) 09/22/2018   MCV 108.8 (H) 09/22/2018   PLT 121 (L) 09/22/2018   Lab Results  Component Value Date   CREATININE 7.61 (H) 09/22/2018   BUN 31 (H) 09/22/2018   NA 138 09/22/2018   K 4.0 09/22/2018   CL 100 09/22/2018   CO2 24 09/22/2018   Lab Results  Component Value Date   ALT 28 09/13/2018   AST 36 09/13/2018   ALKPHOS 200 (H) 09/13/2018   BILITOT 0.6 09/13/2018   Lab Results  Component Value Date   CHOL 176 08/08/2016   HDL 50.50 08/08/2016   LDLCALC 94 08/08/2016   LDLDIRECT 61.0 08/11/2017   TRIG 158.0 (H) 08/08/2016   CHOLHDL 3 08/08/2016    Assessment & Plan    1.  Coronary artery disease: Status post recent CABG x3.  Postoperative  course complicated by soft blood pressures and anemia requiring transfusion.  He has done well since discharge.  He has minimal chest wall discomfort and uses Tylenol sparingly.  He denies any angina or dyspnea.  He has increased his activity around his house and does look forward to participating in cardiac rehabilitation.  He remains on aspirin, beta-blocker, and statin therapy.  He has follow-up with CT surgery later this month.  2.  Ischemic cardiomyopathy/HFrEF: EF 30 to 35%.  He is euvolemic on examination and has not been having any dyspnea.  Volume management per hemodialysis.  He remains on low-dose beta-blocker therapy as this is all that he has been able to tolerate historically.  Pressures have been better since his surgery and if this trend continues, we can look to add an afterload reducing agent.  Ultimately, we will plan on follow-up echo in approximately 3 months to reevaluate LV function and determine candidacy for ICD therapy.  3.  Permanent atrial fibrillation: He remains rate controlled on low-dose beta-blocker and amiodarone therapy.  He is chronically anticoagulated with warfarin in the setting of a mechanical aortic valve.  Amiodarone was started earlier this year in the setting of elevated rates and ongoing chest discomfort.  I am going to reduce his amiodarone to 200 mg daily.  Ideally, would like to be able to get him off of amiodarone in the long run provided that we can stabilize his rates however, rate control with beta-blockers has been limited in the past secondary to soft blood pressures.  4.  Essential hypertension: Blood pressures running a little bit higher post CABG.  He still requires midodrine on dialysis days but he is going to see about holding it to assess whether or not he still needs it.  He remains on low-dose metoprolol on nondialysis days, which he is  tolerating.  5.  Hyperlipidemia: LDL 61 in May.  Continue statin.  6.  History of mechanical aortic valve:  Remains on chronic Coumadin.  Stable valve function by recent echoes/TEE.  7.  End-stage renal disease: Monday Wednesday Friday hemodialysis per nephrology.  8.  Postoperative anemia/anemia of chronic disease: He had hemoglobin drawn yesterday and said it was 9.  He has labs at least twice a month at hemodialysis and therefore I will not follow-up today.  9.  Disposition: He follows up with CT surgery later this month.  He is looking forward to cardiac rehab.  Plan for follow-up in the office in 6 to 8 weeks, at which time we can follow-up lipids and LFTs.   Murray Hodgkins, NP 10/07/2018, 1:34 PM

## 2018-10-08 DIAGNOSIS — N186 End stage renal disease: Secondary | ICD-10-CM | POA: Diagnosis not present

## 2018-10-08 DIAGNOSIS — D631 Anemia in chronic kidney disease: Secondary | ICD-10-CM | POA: Diagnosis not present

## 2018-10-08 DIAGNOSIS — D509 Iron deficiency anemia, unspecified: Secondary | ICD-10-CM | POA: Diagnosis not present

## 2018-10-08 DIAGNOSIS — E875 Hyperkalemia: Secondary | ICD-10-CM | POA: Diagnosis not present

## 2018-10-08 DIAGNOSIS — N2581 Secondary hyperparathyroidism of renal origin: Secondary | ICD-10-CM | POA: Diagnosis not present

## 2018-10-11 ENCOUNTER — Ambulatory Visit (INDEPENDENT_AMBULATORY_CARE_PROVIDER_SITE_OTHER): Payer: Medicare Other

## 2018-10-11 DIAGNOSIS — Z5181 Encounter for therapeutic drug level monitoring: Secondary | ICD-10-CM

## 2018-10-11 DIAGNOSIS — E875 Hyperkalemia: Secondary | ICD-10-CM | POA: Diagnosis not present

## 2018-10-11 DIAGNOSIS — N186 End stage renal disease: Secondary | ICD-10-CM | POA: Diagnosis not present

## 2018-10-11 DIAGNOSIS — N2581 Secondary hyperparathyroidism of renal origin: Secondary | ICD-10-CM | POA: Diagnosis not present

## 2018-10-11 DIAGNOSIS — Z952 Presence of prosthetic heart valve: Secondary | ICD-10-CM

## 2018-10-11 DIAGNOSIS — D631 Anemia in chronic kidney disease: Secondary | ICD-10-CM | POA: Diagnosis not present

## 2018-10-11 DIAGNOSIS — I4821 Permanent atrial fibrillation: Secondary | ICD-10-CM

## 2018-10-11 DIAGNOSIS — D509 Iron deficiency anemia, unspecified: Secondary | ICD-10-CM | POA: Diagnosis not present

## 2018-10-11 LAB — POCT INR: INR: 5.3 — AB (ref 2.0–3.0)

## 2018-10-11 NOTE — Patient Instructions (Signed)
Spoke w/ pt and advised him to have a serving of greens today, skip coumadin today and tomorrow, then resume dosage 1 mg every day except 2mg  on Sundays.  Recheck INR in 1 week. Amiodarone started 08/05/18, decreased 7/2 to once daily. Home INR monitor ~0.2 off compared to lab check historically.

## 2018-10-12 ENCOUNTER — Other Ambulatory Visit: Payer: Self-pay | Admitting: Family Medicine

## 2018-10-12 NOTE — Telephone Encounter (Signed)
Printed letter and pu in quick sign folder to be signed by provider.  Nina,cma

## 2018-10-13 DIAGNOSIS — N186 End stage renal disease: Secondary | ICD-10-CM | POA: Diagnosis not present

## 2018-10-13 DIAGNOSIS — N2581 Secondary hyperparathyroidism of renal origin: Secondary | ICD-10-CM | POA: Diagnosis not present

## 2018-10-13 DIAGNOSIS — D509 Iron deficiency anemia, unspecified: Secondary | ICD-10-CM | POA: Diagnosis not present

## 2018-10-13 DIAGNOSIS — E875 Hyperkalemia: Secondary | ICD-10-CM | POA: Diagnosis not present

## 2018-10-13 DIAGNOSIS — D631 Anemia in chronic kidney disease: Secondary | ICD-10-CM | POA: Diagnosis not present

## 2018-10-14 ENCOUNTER — Other Ambulatory Visit: Payer: Self-pay | Admitting: Thoracic Surgery (Cardiothoracic Vascular Surgery)

## 2018-10-14 ENCOUNTER — Other Ambulatory Visit: Payer: Self-pay | Admitting: Surgical

## 2018-10-14 DIAGNOSIS — Z951 Presence of aortocoronary bypass graft: Secondary | ICD-10-CM

## 2018-10-15 DIAGNOSIS — D509 Iron deficiency anemia, unspecified: Secondary | ICD-10-CM | POA: Diagnosis not present

## 2018-10-15 DIAGNOSIS — E875 Hyperkalemia: Secondary | ICD-10-CM | POA: Diagnosis not present

## 2018-10-15 DIAGNOSIS — D631 Anemia in chronic kidney disease: Secondary | ICD-10-CM | POA: Diagnosis not present

## 2018-10-15 DIAGNOSIS — N186 End stage renal disease: Secondary | ICD-10-CM | POA: Diagnosis not present

## 2018-10-15 DIAGNOSIS — N2581 Secondary hyperparathyroidism of renal origin: Secondary | ICD-10-CM | POA: Diagnosis not present

## 2018-10-16 LAB — POCT INR: INR: 5.3 — AB (ref <=1.1)

## 2018-10-18 ENCOUNTER — Ambulatory Visit (INDEPENDENT_AMBULATORY_CARE_PROVIDER_SITE_OTHER): Payer: Medicare Other

## 2018-10-18 ENCOUNTER — Other Ambulatory Visit: Payer: Self-pay

## 2018-10-18 DIAGNOSIS — Z5181 Encounter for therapeutic drug level monitoring: Secondary | ICD-10-CM | POA: Diagnosis not present

## 2018-10-18 DIAGNOSIS — N2581 Secondary hyperparathyroidism of renal origin: Secondary | ICD-10-CM | POA: Diagnosis not present

## 2018-10-18 DIAGNOSIS — D631 Anemia in chronic kidney disease: Secondary | ICD-10-CM | POA: Diagnosis not present

## 2018-10-18 DIAGNOSIS — Z952 Presence of prosthetic heart valve: Secondary | ICD-10-CM | POA: Diagnosis not present

## 2018-10-18 DIAGNOSIS — N186 End stage renal disease: Secondary | ICD-10-CM | POA: Diagnosis not present

## 2018-10-18 DIAGNOSIS — D509 Iron deficiency anemia, unspecified: Secondary | ICD-10-CM | POA: Diagnosis not present

## 2018-10-18 DIAGNOSIS — I4821 Permanent atrial fibrillation: Secondary | ICD-10-CM

## 2018-10-18 DIAGNOSIS — E875 Hyperkalemia: Secondary | ICD-10-CM | POA: Diagnosis not present

## 2018-10-18 LAB — POCT INR: INR: 4.9 — AB (ref 2.0–3.0)

## 2018-10-18 NOTE — Patient Instructions (Signed)
Please have a serving of greens today, skip coumadin today and tomorrow, then resume dosage 1 mg every day except 2mg  on Sundays.  Recheck INR in 1 week.

## 2018-10-19 ENCOUNTER — Ambulatory Visit
Admission: RE | Admit: 2018-10-19 | Discharge: 2018-10-19 | Disposition: A | Discharge status: Home / Self Care | Payer: Medicare Other | Source: Ambulatory Visit | Attending: Thoracic Surgery (Cardiothoracic Vascular Surgery) | Admitting: Thoracic Surgery (Cardiothoracic Vascular Surgery)

## 2018-10-19 ENCOUNTER — Ambulatory Visit (INDEPENDENT_AMBULATORY_CARE_PROVIDER_SITE_OTHER): Payer: Self-pay | Admitting: Thoracic Surgery (Cardiothoracic Vascular Surgery)

## 2018-10-19 ENCOUNTER — Encounter: Payer: Self-pay | Admitting: Thoracic Surgery (Cardiothoracic Vascular Surgery)

## 2018-10-19 VITALS — BP 89/44 | HR 76 | Temp 97.5°F | Resp 16 | Ht 70.0 in | Wt 184.0 lb

## 2018-10-19 DIAGNOSIS — Z951 Presence of aortocoronary bypass graft: Secondary | ICD-10-CM

## 2018-10-19 DIAGNOSIS — I517 Cardiomegaly: Secondary | ICD-10-CM | POA: Diagnosis not present

## 2018-10-19 DIAGNOSIS — I25118 Atherosclerotic heart disease of native coronary artery with other forms of angina pectoris: Secondary | ICD-10-CM

## 2018-10-19 NOTE — Progress Notes (Signed)
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  He presented with a complaint of chest pain dating back to the fall 2019.  He was having increasing frequency and severity of pain.  An echocardiogram showed an ejection fraction of 35%.  He was initially seen at [facility] but then transferred his care to Dr. [name].  Cardiac catheterization showed severe two-vessel disease and he was referred for coronary artery bypass grafting back in March.  He delayed surgery secondary to COVID, but finally presented in June for surgery.  I did a redo sternotomy for coronary artery bypass grafting x3 on 09/16/2018.  He did well postoperatively and went home on postoperative day #6.  Since discharge she has been feeling well.  He is not taking any narcotics for pain.  He occasionally is taking Tylenol.  He has noticed marked improvement in his exercise tolerance.  Previously he could barely walk to his mailbox and back, currently he is walking around the block a couple of times a day.  He says his blood pressures been running a little higher since surgery but he still using midodrine on dialysis days.  Past Medical History:  Diagnosis Date  . 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 Aortic stenosis    a. 2014 s/p mech AVR ([facility])-->chronic coumadin; b. 08/2018 Echo: Mild AS/AI. Nl fxn/gradient.  . Arthritis   . CAD (coronary artery disease)    a. 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 02/2018 MV [facility]): large inf, infsept, inflat infarct w/ min rev, EF 30%; c. 06/2018 Cath: LM min irregs, LAD 60p, 95/30/47m, LCX min irregs, RCA 40p, 68m, 90d, RPAV 90, RPDA 95ost; c. 09/2018 CABGx3 (LIMA->LAD, VG->D1, VG->PDA).  . Chronic anticoagulation 06/2012   a. Coumadin in the setting of mech AVR and afib.  . Dialysis patient [facility]) 04/07/2006   Patient started HD around 1996,  had 1st transplant LLQ around 1998 until 2007 when they found renal cell cancer in transplant and both native kidneys, all were removed > went back on HD until 2nd transplant RLQ in 2014 which lasted 3 yrs; it was embolized for suspected acute rejection. Is currently on HD MWF in [place] w/ [facility] nephrology.  . ESRD (end stage renal disease) ([place])    a. 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 1999 s/p Kidney transplant; c. 2014 Bilat nephrectomy (transplanted kidney resected) in the setting of renal cell carcinoma; d. PD until 11/2017-->HD.  Marland Kitchen HFrEF (heart failure with reduced ejection fraction) ([place])    a. 07/2016 Echo: EF 55-60%; b. 02/2018 MV: EF 30%; c. 04/2018 Echo: EF 35%; d. 08/2018 Echo: EF 30-35%. Glob HK w/o rwma. Mildly reduced RV fxn. Mod to sev dil LA. Nl AoV fxn.  Marland Kitchen Hx of colonoscopy    2009 by Dr. Laural Golden. Normal except for small submucosal lipoma. No evidence of polyps or colitis.  . Hyperlipidemia   . Hypertension   .  Hypogonadism in male 07/25/2015  . Ischemic cardiomyopathy    a. 07/2016 Echo: EF 55-60%; b. 02/2018 MV: EF 30%; c. 04/2018 Echo: EF 35%; d. 08/2018 Echo: EF 30-35%.  . OSA (obstructive sleep apnea)    No CPAP  . Permanent atrial fibrillation    a. CHA2DS2VASc = 3-->Coumadin (also mech AVR); b. 06/2018 Amio started 2/2 elevated rates.  . Renal cell carcinoma ([place]) 08/04/2016    Current Outpatient Medications  Medication Sig Dispense Refill  . acetaminophen (TYLENOL) 500 MG tablet Take 1,000 mg 2 (two) times daily as needed by mouth for moderate pain.    Marland Kitchen allopurinol  (ZYLOPRIM) 300 MG tablet Take 300 mg by mouth daily.  5  . amiodarone (PACERONE) 200 MG tablet Take 1 tablet (200 mg total) by mouth daily. 180 tablet 3  . aspirin EC 81 MG EC tablet Take 1 tablet (81 mg total) by mouth daily.    . cinacalcet (SENSIPAR) 30 MG tablet Take 30 mg by mouth daily.     . Darbepoetin Alfa (ARANESP) 100 MCG/0.5ML SOSY injection Inject 0.5 mLs (100 mcg total) into the vein every Monday with hemodialysis. 4.2 mL   . Dextrose-Fructose-Sod Citrate (NAUZENE PO) Take 1-2 tablets 2 (two) times daily as needed by mouth (nausea).    Marland Kitchen FLUoxetine (PROZAC) 10 MG capsule TAKE 1 CAPSULE BY MOUTH EVERY DAY 90 capsule 0  . folic acid-vitamin b complex-vitamin c-selenium-zinc (DIALYVITE) 3 MG TABS tablet Take 1 tablet by mouth daily.    Marland Kitchen lidocaine-prilocaine (EMLA) cream Apply 1 application topically as needed (for fistula).    . metoprolol tartrate (LOPRESSOR) 25 MG tablet Take 0.5 tablets (12.5 mg total) by mouth 2 (two) times daily. 30 tablet 1  . midodrine (PROAMATINE) 10 MG tablet Take 10 mg by mouth See admin instructions. Take on Monday, Wednesday and Friday before dialysis    . omeprazole (PRILOSEC) 20 MG capsule Take 20 mg by mouth daily.    . predniSONE (DELTASONE) 5 MG tablet Take 5 mg by mouth daily with breakfast.     . rosuvastatin (CRESTOR) 10 MG tablet Take 1 tablet (10 mg total) by mouth daily at 6 PM. 30 tablet 1  . sevelamer carbonate (RENVELA) 800 MG tablet Take 40,000 mg by mouth 3 (three) times daily with meals. 5 tabs per meal    . warfarin (COUMADIN) 1 MG tablet Take 1 tablet (1 mg total) by mouth daily at 6 PM. (Patient taking differently: Take 1-2 mg by mouth See admin instructions. 1 mg all days , except 2 mg on Sunday..* OR AS DIRECTED) 90 tablet 1   No current facility-administered medications for this visit.     Physical Exam BP (!) 89/44 (BP Location: Right Arm, Patient Position: Sitting, Cuff Size: Normal)   Pulse 76   Temp (!) 97.5 F (36.4 C)  Comment: THERMAL  Resp 16   Ht 5\' 10"  (1.778 m)   Wt 184 lb (83.5 kg)   SpO2 96% Comment: RA  BMI 26.70 kg/m  63 year old man in no acute distress Alert and oriented x3 with no focal deficits Lungs clear with equal breath sounds bilaterally Cardiac irregular, good valve click Sternum stable, incision healing well No peripheral edema, leg incisions healing well  Diagnostic Tests: CHEST - 2 VIEW  COMPARISON:  Chest x-ray dated September 21, 2018.  FINDINGS: Stable mild cardiomegaly status post CABG and aortic valve replacement. Normal pulmonary vascularity. Resolved small bilateral pleural effusions. No consolidation or pneumothorax. No acute osseous abnormality.  IMPRESSION: No active cardiopulmonary disease.  Resolved pleural effusions.   Electronically Signed   By: Titus Dubin M.D.   On: 10/19/2018 11:30 I personally reviewed the chest x-ray images and concur with the findings noted above  Impression: Jeremy [name] is a 63 year old man with a complex medical history including aortic valve replacement for aortic stenosis, end-stage renal failure, IgA nephropathy, bilateral nephrectomy for renal cell carcinoma, hypertension, hyperlipidemia, chronic atrial fibrillation, ischemic cardiomyopathy, and a failed renal transplant.  He presented with anginal symptoms.  He was having quite frequent angina and had severely limited his activities.  Catheterization showed severe two-vessel disease.  He delayed his treatment due to [place] but finally presented for surgery and underwent coronary artery bypass grafting x3 on 09/16/2018.  His postoperative course was unremarkable.  CAD-no anginal symptoms.  Exercise tolerance improving.  Should see continued improvement over the next 3 to 6 months.  Hypotension-has a history of hypertension.  Has been hypotensive for a while particularly on dialysis days.  He is using midodrine as needed.  Renal failure-on hemodialysis.  He is having  minimal discomfort and is only taking acetaminophen.  He should not lift anything over 10 pounds for another 2 weeks and nothing over 20 pounds for 4 weeks.  Other than that his activities are unrestricted.  He may begin driving on a limited basis.  Appropriate precautions were discussed.  He will contact Dr. Tyrell Antonio office regarding cardiac rehab  Plan: Follow-up with Dr. [name] I will be happy to see Mr. Grosso back again anytime in the future if I can be of any further assistance with his care.  Melrose Nakayama, MD [facility] [phone]

## 2018-10-20 DIAGNOSIS — N186 End stage renal disease: Secondary | ICD-10-CM | POA: Diagnosis not present

## 2018-10-20 DIAGNOSIS — D631 Anemia in chronic kidney disease: Secondary | ICD-10-CM | POA: Diagnosis not present

## 2018-10-20 DIAGNOSIS — E875 Hyperkalemia: Secondary | ICD-10-CM | POA: Diagnosis not present

## 2018-10-20 DIAGNOSIS — D509 Iron deficiency anemia, unspecified: Secondary | ICD-10-CM | POA: Diagnosis not present

## 2018-10-20 DIAGNOSIS — N2581 Secondary hyperparathyroidism of renal origin: Secondary | ICD-10-CM | POA: Diagnosis not present

## 2018-10-22 ENCOUNTER — Telehealth: Payer: Self-pay | Admitting: Cardiovascular Disease

## 2018-10-22 ENCOUNTER — Telehealth: Payer: Self-pay

## 2018-10-22 DIAGNOSIS — D631 Anemia in chronic kidney disease: Secondary | ICD-10-CM | POA: Diagnosis not present

## 2018-10-22 DIAGNOSIS — E875 Hyperkalemia: Secondary | ICD-10-CM | POA: Diagnosis not present

## 2018-10-22 DIAGNOSIS — D509 Iron deficiency anemia, unspecified: Secondary | ICD-10-CM | POA: Diagnosis not present

## 2018-10-22 DIAGNOSIS — N2581 Secondary hyperparathyroidism of renal origin: Secondary | ICD-10-CM | POA: Diagnosis not present

## 2018-10-22 DIAGNOSIS — N186 End stage renal disease: Secondary | ICD-10-CM | POA: Diagnosis not present

## 2018-10-22 NOTE — Telephone Encounter (Signed)
Please call patient to advise

## 2018-10-22 NOTE — Telephone Encounter (Signed)
Patient calling in regarding auryxia, prescribed by dialysis center dietician. Patient wants to make sure it is safe to take and that it will not effect his INR level.  Please advise.

## 2018-10-22 NOTE — Telephone Encounter (Signed)
Ok to use [place], this is a phosphate binder and will not affect INR.

## 2018-10-22 NOTE — Telephone Encounter (Signed)
Called patient and left message to return call

## 2018-10-22 NOTE — Telephone Encounter (Signed)

## 2018-10-25 ENCOUNTER — Other Ambulatory Visit: Payer: Self-pay

## 2018-10-25 ENCOUNTER — Ambulatory Visit (INDEPENDENT_AMBULATORY_CARE_PROVIDER_SITE_OTHER): Payer: Medicare Other

## 2018-10-25 DIAGNOSIS — E875 Hyperkalemia: Secondary | ICD-10-CM | POA: Diagnosis not present

## 2018-10-25 DIAGNOSIS — N2581 Secondary hyperparathyroidism of renal origin: Secondary | ICD-10-CM | POA: Diagnosis not present

## 2018-10-25 DIAGNOSIS — Z5181 Encounter for therapeutic drug level monitoring: Secondary | ICD-10-CM

## 2018-10-25 DIAGNOSIS — I4821 Permanent atrial fibrillation: Secondary | ICD-10-CM

## 2018-10-25 DIAGNOSIS — D631 Anemia in chronic kidney disease: Secondary | ICD-10-CM | POA: Diagnosis not present

## 2018-10-25 DIAGNOSIS — Z952 Presence of prosthetic heart valve: Secondary | ICD-10-CM | POA: Diagnosis not present

## 2018-10-25 DIAGNOSIS — N186 End stage renal disease: Secondary | ICD-10-CM | POA: Diagnosis not present

## 2018-10-25 DIAGNOSIS — D509 Iron deficiency anemia, unspecified: Secondary | ICD-10-CM | POA: Diagnosis not present

## 2018-10-25 LAB — POCT INR: INR: 3.6 — AB (ref 2.0–3.0)

## 2018-10-25 NOTE — Patient Instructions (Signed)
Please have a serving of greens today and continue dosage 1 mg every day except 2mg  on Sundays.  Recheck INR in 1 week.

## 2018-10-25 NOTE — Telephone Encounter (Signed)
Spoke with pt, aware he can take [place].

## 2018-10-27 DIAGNOSIS — N186 End stage renal disease: Secondary | ICD-10-CM | POA: Diagnosis not present

## 2018-10-27 DIAGNOSIS — D509 Iron deficiency anemia, unspecified: Secondary | ICD-10-CM | POA: Diagnosis not present

## 2018-10-27 DIAGNOSIS — D631 Anemia in chronic kidney disease: Secondary | ICD-10-CM | POA: Diagnosis not present

## 2018-10-27 DIAGNOSIS — N2581 Secondary hyperparathyroidism of renal origin: Secondary | ICD-10-CM | POA: Diagnosis not present

## 2018-10-27 DIAGNOSIS — E875 Hyperkalemia: Secondary | ICD-10-CM | POA: Diagnosis not present

## 2018-10-29 DIAGNOSIS — E875 Hyperkalemia: Secondary | ICD-10-CM | POA: Diagnosis not present

## 2018-10-29 DIAGNOSIS — D509 Iron deficiency anemia, unspecified: Secondary | ICD-10-CM | POA: Diagnosis not present

## 2018-10-29 DIAGNOSIS — D631 Anemia in chronic kidney disease: Secondary | ICD-10-CM | POA: Diagnosis not present

## 2018-10-29 DIAGNOSIS — N186 End stage renal disease: Secondary | ICD-10-CM | POA: Diagnosis not present

## 2018-10-29 DIAGNOSIS — N2581 Secondary hyperparathyroidism of renal origin: Secondary | ICD-10-CM | POA: Diagnosis not present

## 2018-11-01 ENCOUNTER — Ambulatory Visit (INDEPENDENT_AMBULATORY_CARE_PROVIDER_SITE_OTHER): Payer: Medicare Other

## 2018-11-01 ENCOUNTER — Other Ambulatory Visit: Payer: Self-pay

## 2018-11-01 DIAGNOSIS — D631 Anemia in chronic kidney disease: Secondary | ICD-10-CM | POA: Diagnosis not present

## 2018-11-01 DIAGNOSIS — Z5181 Encounter for therapeutic drug level monitoring: Secondary | ICD-10-CM | POA: Diagnosis not present

## 2018-11-01 DIAGNOSIS — E875 Hyperkalemia: Secondary | ICD-10-CM | POA: Diagnosis not present

## 2018-11-01 DIAGNOSIS — N2581 Secondary hyperparathyroidism of renal origin: Secondary | ICD-10-CM | POA: Diagnosis not present

## 2018-11-01 DIAGNOSIS — Z952 Presence of prosthetic heart valve: Secondary | ICD-10-CM

## 2018-11-01 DIAGNOSIS — I4821 Permanent atrial fibrillation: Secondary | ICD-10-CM

## 2018-11-01 DIAGNOSIS — N186 End stage renal disease: Secondary | ICD-10-CM | POA: Diagnosis not present

## 2018-11-01 DIAGNOSIS — D509 Iron deficiency anemia, unspecified: Secondary | ICD-10-CM | POA: Diagnosis not present

## 2018-11-01 LAB — POCT INR
INR: 5.4 — AB (ref 2.0–3.0)
INR: 5.4 — AB (ref <=1.1)

## 2018-11-01 NOTE — Patient Instructions (Signed)
Please skip coumadin tonight, have a serving of greens today, then START NEW DOSAGE 1 mg EVERY DAY EXCEPT 1/2 [place]..  Recheck INR in 2 weeks.

## 2018-11-03 DIAGNOSIS — N2581 Secondary hyperparathyroidism of renal origin: Secondary | ICD-10-CM | POA: Diagnosis not present

## 2018-11-03 DIAGNOSIS — E875 Hyperkalemia: Secondary | ICD-10-CM | POA: Diagnosis not present

## 2018-11-03 DIAGNOSIS — D631 Anemia in chronic kidney disease: Secondary | ICD-10-CM | POA: Diagnosis not present

## 2018-11-03 DIAGNOSIS — D509 Iron deficiency anemia, unspecified: Secondary | ICD-10-CM | POA: Diagnosis not present

## 2018-11-03 DIAGNOSIS — N186 End stage renal disease: Secondary | ICD-10-CM | POA: Diagnosis not present

## 2018-11-03 NOTE — Progress Notes (Signed)
11/04/2018 2:37 PM   Jeremy [name] [birth year] [id number]  Referring provider: Leone Haven, MD [address] [place],  [place] [address]  Chief Complaint  Patient presents with  . Elevated PSA    HPI: [name] is a 63 year old male with a past medical history of renal transplant currently on dialysis, a history of testosterone deficiency and a history of a rising PSA who presents today for follow up.  ESRD First transplant in 1999.  He was found to have [place] in the transplanted kidney and in his bilateral native kidneys.  He underwent native bilateral nephrectomy and renal transplant in 2007.  His next transplant was in 2014.  On dialysis. Followed by [facility].  CT chest abd pelvis w/o contrast 11/2017 noted status post bilateral modified radical nephrectomy. Within the limitations of this unenhanced CT examination, there is no CT evidence of local recurrence or metastatic disease in the chest abdomen or pelvis.  Right lower quadrant renal allograft, post embolization, showing expected changes of reduction in volume and cortical calcification.  Status post recent removal of nonfunctioning Tenckhoff catheter. Soft tissue stranding at the periumbilical region of the ventral abdominal wall, with subcutaneous emphysema changes - likely postprocedural, however underlying infection cannot be definitively ruled out.  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 Correlate with clinical history.  Nodular contour of the liver suggest underlying hepatic parenchymal disease. Correlate with medical history.  Ancillary findings as above    Rising PSA PSA Trend  2.18 in 07/2016  2.45 in 04/2017  3.12 in 04/2017  3.4 in 08/2017  3.0 in 09/2017  2.4 in 12/2017  5.1 in 08/2018  Testosterone deficiency Not currently on therapy.    Discharge He has been having this penile  discharge off and on since October.  He states that it has improved.  He had STI cultures performed at his PCPs office and they were negative.  He describes the fluid as resembling semen.  He is oliguric.  He denies any bloody discharge from the penis.  He denies any suprapubic pain or lower back pain.  He denies any fevers, chills, nausea or vomiting.   PMH: Past Medical History:  Diagnosis Date  . 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 Aortic stenosis    a. 2014 s/p mech AVR ([facility])-->chronic coumadin; b. 08/2018 Echo: Mild AS/AI. Nl fxn/gradient.  . Arthritis   . CAD (coronary artery disease)    a. 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 02/2018 MV [facility]): large inf, infsept, inflat infarct w/ min rev, EF 30%; c. 06/2018 Cath: LM min irregs, LAD 60p, 95/30/8m, LCX min irregs, RCA 40p, 43m, 90d, RPAV 90, RPDA 95ost; c. 09/2018 CABGx3 (LIMA->LAD, VG->D1, VG->PDA).  . Chronic anticoagulation 06/2012   a. Coumadin in the setting of mech AVR and afib.  . Dialysis patient Jeremy [name]) 62/04/2005   Patient started HD around 1996,  had 1st transplant LLQ around 1998 until 2007 when they found renal cell cancer in transplant and both native kidneys, all were removed > went back on HD until 2nd transplant RLQ in 2014 which lasted 3 yrs; it was embolized for suspected acute rejection. Is currently on HD MWF in [place] w/ [name] nephrology.  . ESRD (end stage renal disease) ([place])    a. CKD 2/2 to IgA nephropathy (  dx age 63); b. 1999 s/p Kidney transplant; c. 2014 Bilat nephrectomy (transplanted kidney resected) in the setting of renal cell carcinoma; d. PD until 11/2017-->HD.  Marland Kitchen HFrEF (heart failure with reduced ejection fraction) ([place])    a. 07/2016 Echo: EF 55-60%; b. 02/2018 MV: EF 30%; c. 04/2018 Echo: EF 35%; d. 08/2018 Echo: EF 30-35%. Glob HK w/o rwma. Mildly reduced RV fxn. Mod to sev dil LA. Nl AoV fxn.  Marland Kitchen Hx of colonoscopy    2009 by Dr.  Laural Golden. Normal except for small submucosal lipoma. No evidence of polyps or colitis.  . Hyperlipidemia   . Hypertension   . Hypogonadism in male 07/25/2015  . Ischemic cardiomyopathy    a. 07/2016 Echo: EF 55-60%; b. 02/2018 MV: EF 30%; c. 04/2018 Echo: EF 35%; d. 08/2018 Echo: EF 30-35%.  . OSA (obstructive sleep apnea)    No CPAP  . Permanent atrial fibrillation    a. CHA2DS2VASc = 3-->Coumadin (also mech AVR); b. 06/2018 Amio started 2/2 elevated rates.  . Renal cell carcinoma ([place]) 08/04/2016    Surgical History: Past Surgical History:  Procedure Laterality Date  . AORTIC VALVE REPLACEMENT  3 /11 /2014   At [place]    . CAPD INSERTION N/A 02/19/2017   Procedure: LAPAROSCOPIC INSERTION CONTINUOUS AMBULATORY PERITONEAL DIALYSIS  (CAPD) CATHETER;  Surgeon: Algernon Huxley, MD;  Location: [facility];  Service: General;  Laterality: N/A;  . CORONARY ANGIOPLASTY WITH STENT PLACEMENT    . CORONARY ARTERY BYPASS GRAFT N/A 09/16/2018   Procedure: CORONARY ARTERY BYPASS GRAFT times three      with left internal mammary artery and endoharvest of right leg greater saphenous vein;  Surgeon: Melrose Nakayama, MD;  Location: [place];  Service: Open Heart Surgery;  Laterality: N/A;  . DG AV DIALYSIS  SHUNT ACCESS EXIST*L* OR     left arm  . EPIGASTRIC HERNIA REPAIR N/A 02/19/2017   Procedure: HERNIA REPAIR EPIGASTRIC ADULT;  Surgeon: Robert Bellow, MD;  Location: [facility];  Service: General;  Laterality: N/A;  . EYE SURGERY     bilateral cataract  . HERNIA REPAIR     UMBILICAL HERNIA  . HERNIA REPAIR  02/19/2017  . INNER EAR SURGERY     20 years ago  . KIDNEY TRANSPLANT    . Peritoneal Dialysis access placement  02/19/2017  . REMOVAL OF A DIALYSIS CATHETER N/A 11/23/2017   Procedure: REMOVAL OF A PERITONEAL DIALYSIS CATHETER;  Surgeon: Algernon Huxley, MD;  Location: [facility];  Service: Vascular;  Laterality: N/A;  . RIGHT/LEFT HEART CATH AND  CORONARY ANGIOGRAPHY Bilateral 06/07/2018   Procedure: RIGHT/LEFT HEART CATH AND CORONARY ANGIOGRAPHY;  Surgeon: Wellington Hampshire, MD;  Location: [place] [facility];  Service: Cardiovascular;  Laterality: Bilateral;  . TEE WITHOUT CARDIOVERSION N/A 07/21/2016   Procedure: TRANSESOPHAGEAL ECHOCARDIOGRAM (TEE);  Surgeon: Wellington Hampshire, MD;  Location: [facility];  Service: Cardiovascular;  Laterality: N/A;  . TEE WITHOUT CARDIOVERSION N/A 09/16/2018   Procedure: TRANSESOPHAGEAL ECHOCARDIOGRAM (TEE);  Surgeon: Melrose Nakayama, MD;  Location: [place];  Service: Open Heart Surgery;  Laterality: N/A;    Home Medications:  Allergies as of 11/04/2018      Reactions   Statins    Other reaction(s): Arthralgias (intolerance), Myalgias (intolerance) Pt taking pravastatin    Sertraline Rash      Medication List       Accurate as of November 04, 2018  2:37 PM.  If you have any questions, ask your nurse or doctor.        acetaminophen 500 MG tablet Commonly known as: TYLENOL Take 1,000 mg 2 (two) times daily as needed by mouth for moderate pain.   allopurinol 300 MG tablet Commonly known as: ZYLOPRIM Take 300 mg by mouth daily.   amiodarone 200 MG tablet Commonly known as: PACERONE Take 1 tablet (200 mg total) by mouth daily.   aspirin 81 MG EC tablet Take 1 tablet (81 mg total) by mouth daily.   Auryxia 1 GM 210 MG(Fe) tablet Generic drug: ferric citrate TAKE 2 TABLETS BY MOUTH THREE TIMES A DAY WITH MEALS   cinacalcet 30 MG tablet Commonly known as: SENSIPAR Take 30 mg by mouth daily.   Darbepoetin Alfa 100 MCG/0.5ML Sosy injection Commonly known as: ARANESP Inject 0.5 mLs (100 mcg total) into the vein every Monday with hemodialysis.   FLUoxetine 10 MG capsule Commonly known as: PROZAC TAKE 1 CAPSULE BY MOUTH EVERY DAY   folic acid-vitamin b complex-vitamin c-selenium-zinc 3 MG Tabs tablet Take 1 tablet by mouth daily.   lidocaine-prilocaine cream Commonly known as: EMLA  Apply 1 application topically as needed (for fistula).   metoprolol tartrate 25 MG tablet Commonly known as: LOPRESSOR Take 0.5 tablets (12.5 mg total) by mouth 2 (two) times daily.   midodrine 10 MG tablet Commonly known as: PROAMATINE Take 10 mg by mouth See admin instructions. Take on Monday, Wednesday and Friday before dialysis   NAUZENE PO Take 1-2 tablets 2 (two) times daily as needed by mouth (nausea).   omeprazole 20 MG capsule Commonly known as: PRILOSEC Take 20 mg by mouth daily.   predniSONE 5 MG tablet Commonly known as: DELTASONE Take 5 mg by mouth daily with breakfast.   rosuvastatin 10 MG tablet Commonly known as: CRESTOR Take 1 tablet (10 mg total) by mouth daily at 6 PM.   sevelamer carbonate 800 MG tablet Commonly known as: RENVELA Take 40,000 mg by mouth 3 (three) times daily with meals. 5 tabs per meal   warfarin 1 MG tablet Commonly known as: COUMADIN Take as directed by the anticoagulation clinic. If you are unsure how to take this medication, talk to your nurse or doctor. Original instructions: Take 1 tablet (1 mg total) by mouth daily at 6 PM. What changed:   how much to take  when to take this  additional instructions       Allergies:  Allergies  Allergen Reactions  . Statins     Other reaction(s): Arthralgias (intolerance), Myalgias (intolerance)  Pt taking pravastatin   . Sertraline Rash    Family History: Family History  Problem Relation Age of Onset  . Cancer Mother        pancreatic  . Heart disease Father   . Diabetes Father     Social History:  reports that he has never smoked. He has never used smokeless tobacco. He reports that he does not drink alcohol or use drugs.  ROS: UROLOGY Frequent Urination?: No Hard to postpone urination?: No Burning/pain with urination?: No Get up at night to urinate?: No Leakage of urine?: No Urine stream starts and stops?: No Trouble starting stream?: No Do you have to strain to  urinate?: No Blood in urine?: No Urinary tract infection?: No Sexually transmitted disease?: No Injury to kidneys or bladder?: No Painful intercourse?: No Weak stream?: No Erection problems?: No Penile pain?: No  Gastrointestinal Nausea?: No Vomiting?: No Indigestion/heartburn?: No Diarrhea?: No Constipation?: No  Constitutional Fever: No Night sweats?: No Weight loss?: No Fatigue?: No  Skin Skin rash/lesions?: No Itching?: No  Eyes Blurred vision?: No Double vision?: No  Ears/Nose/Throat Sore throat?: No Sinus problems?: No  Hematologic/Lymphatic Swollen glands?: No Easy bruising?: No  Cardiovascular Leg swelling?: No Chest pain?: No  Respiratory Cough?: No Shortness of breath?: No  Endocrine Excessive thirst?: No  Musculoskeletal Back pain?: No Joint pain?: Yes  Neurological Headaches?: No Dizziness?: No  Psychologic Depression?: No Anxiety?: No  Physical Exam: BP 117/65   Pulse 96   Ht 5\' 10"  (1.778 m)   Wt 184 lb (83.5 kg)   BMI 26.40 kg/m   Constitutional:  Well nourished. Alert and oriented, No acute distress. HEENT: [place] AT, moist mucus membranes.  Trachea midline, no masses. Cardiovascular: No clubbing, cyanosis, or edema. Respiratory: Normal respiratory effort, no increased work of breathing. GI: Abdomen is soft, non tender, non distended, no abdominal masses. Liver and spleen not palpable.  No hernias appreciated.  Stool sample for occult testing is not indicated.   GU: No CVA tenderness.  No bladder fullness or masses.  Patient with circumcised phallus.  Urethral meatus is patent.  No penile discharge. No penile lesions or rashes. Scrotum without lesions, cysts, rashes and/or edema.  Right testicle is located scrotally. No masses are appreciated in the testicles. Right epididymis are normal.  Large left hydrocele.   Rectal: Patient with  normal sphincter tone. Anus and perineum without scarring or rashes. No rectal masses are  appreciated. Prostate is approximately 50 grams, right lobe > left lobe, no nodules are appreciated. Seminal vesicles could not be palpated.  Skin: No rashes, bruises or suspicious lesions. Lymph: No inguinal adenopathy. Neurologic: Grossly intact, no focal deficits, moving all 4 extremities. Psychiatric: Normal mood and affect.   Laboratory Data: Lab Results  Component Value Date   WBC 6.8 09/22/2018   HGB 8.4 (L) 09/22/2018   HCT 26.1 (L) 09/22/2018   MCV 108.8 (H) 09/22/2018   PLT 121 (L) 09/22/2018    Lab Results  Component Value Date   CREATININE 7.61 (H) 09/22/2018    Lab Results  Component Value Date   PSA 2.18 07/07/2016    Lab Results  Component Value Date   TESTOSTERONE 200 (L) 05/06/2017    Lab Results  Component Value Date   HGBA1C 5.9 (H) 09/15/2018    Urinalysis    Component Value Date/Time   COLORURINE AMBER (A) 07/17/2016 1531   APPEARANCEUR CLOUDY (A) 07/17/2016 1531   LABSPEC 1.030 07/17/2016 1531   PHURINE 8.0 07/17/2016 1531   GLUCOSEU 50 (A) 07/17/2016 1531   HGBUR NEGATIVE 07/17/2016 1531   BILIRUBINUR NEGATIVE 07/17/2016 1531   KETONESUR NEGATIVE 07/17/2016 1531   PROTEINUR 100 (A) 07/17/2016 1531   NITRITE NEGATIVE 07/17/2016 1531   LEUKOCYTESUR NEGATIVE 07/17/2016 1531   I have reviewed the labs.  Assessment & Plan:    1. Rising PSA Discussed with patient the implications of an increase in the PSA, specifically as it may represent a prostate cancer  PSA was repeated today If it does not return to baseline, discussed obtaining a 4K score or pursuing a prostate MRI for further evaluation as patient will not be able to undergo a prostate biopsy as he just underwent a CABG in June   2. Discharge Resolving  3. Left hydrocele Patient is interested in having this addressed once he has recovered from his CABG    Return for pending PSA results .  Jaylanni Eltringham, PA-C  [place], [place] [address] [phone]

## 2018-11-04 ENCOUNTER — Encounter: Payer: Self-pay | Admitting: Urology

## 2018-11-04 ENCOUNTER — Other Ambulatory Visit: Payer: Self-pay

## 2018-11-04 ENCOUNTER — Encounter: Payer: Medicare Other | Attending: Cardiovascular Disease | Admitting: *Deleted

## 2018-11-04 ENCOUNTER — Ambulatory Visit (INDEPENDENT_AMBULATORY_CARE_PROVIDER_SITE_OTHER): Payer: Medicare Other | Admitting: Urology

## 2018-11-04 VITALS — BP 117/65 | HR 96 | Ht 70.0 in | Wt 184.0 lb

## 2018-11-04 DIAGNOSIS — N432 Other hydrocele: Secondary | ICD-10-CM | POA: Diagnosis not present

## 2018-11-04 DIAGNOSIS — R972 Elevated prostate specific antigen [PSA]: Secondary | ICD-10-CM

## 2018-11-04 DIAGNOSIS — I2510 Atherosclerotic heart disease of native coronary artery without angina pectoris: Secondary | ICD-10-CM | POA: Diagnosis not present

## 2018-11-04 DIAGNOSIS — R369 Urethral discharge, unspecified: Secondary | ICD-10-CM | POA: Diagnosis not present

## 2018-11-04 DIAGNOSIS — Z951 Presence of aortocoronary bypass graft: Secondary | ICD-10-CM

## 2018-11-04 NOTE — Progress Notes (Signed)
Virtual RN orientation completed. Diagnosis can be found in [facility] 6/8. EP/RD orientation scheduled for 8/4 at 1:30

## 2018-11-05 DIAGNOSIS — D509 Iron deficiency anemia, unspecified: Secondary | ICD-10-CM | POA: Diagnosis not present

## 2018-11-05 DIAGNOSIS — Z992 Dependence on renal dialysis: Secondary | ICD-10-CM | POA: Diagnosis not present

## 2018-11-05 DIAGNOSIS — N186 End stage renal disease: Secondary | ICD-10-CM | POA: Diagnosis not present

## 2018-11-05 DIAGNOSIS — E875 Hyperkalemia: Secondary | ICD-10-CM | POA: Diagnosis not present

## 2018-11-05 DIAGNOSIS — N2581 Secondary hyperparathyroidism of renal origin: Secondary | ICD-10-CM | POA: Diagnosis not present

## 2018-11-05 DIAGNOSIS — T8610 Unspecified complication of kidney transplant: Secondary | ICD-10-CM | POA: Diagnosis not present

## 2018-11-05 DIAGNOSIS — D631 Anemia in chronic kidney disease: Secondary | ICD-10-CM | POA: Diagnosis not present

## 2018-11-05 LAB — PSA: Prostate Specific Ag, Serum: 3.3 ng/mL (ref 0.0–4.0)

## 2018-11-07 ENCOUNTER — Other Ambulatory Visit: Payer: Self-pay | Admitting: Surgical

## 2018-11-08 ENCOUNTER — Ambulatory Visit (INDEPENDENT_AMBULATORY_CARE_PROVIDER_SITE_OTHER): Payer: Medicare Other

## 2018-11-08 ENCOUNTER — Telehealth: Payer: Self-pay | Admitting: Cardiovascular Disease

## 2018-11-08 ENCOUNTER — Telehealth: Payer: Self-pay | Admitting: *Deleted

## 2018-11-08 DIAGNOSIS — Z952 Presence of prosthetic heart valve: Secondary | ICD-10-CM

## 2018-11-08 DIAGNOSIS — I4821 Permanent atrial fibrillation: Secondary | ICD-10-CM

## 2018-11-08 DIAGNOSIS — Z992 Dependence on renal dialysis: Secondary | ICD-10-CM | POA: Diagnosis not present

## 2018-11-08 DIAGNOSIS — Z5181 Encounter for therapeutic drug level monitoring: Secondary | ICD-10-CM

## 2018-11-08 DIAGNOSIS — D631 Anemia in chronic kidney disease: Secondary | ICD-10-CM | POA: Diagnosis not present

## 2018-11-08 DIAGNOSIS — N2581 Secondary hyperparathyroidism of renal origin: Secondary | ICD-10-CM | POA: Diagnosis not present

## 2018-11-08 DIAGNOSIS — N186 End stage renal disease: Secondary | ICD-10-CM | POA: Diagnosis not present

## 2018-11-08 LAB — POCT INR: INR: 3.6 — AB (ref 2.0–3.0)

## 2018-11-08 NOTE — Telephone Encounter (Addendum)
Patient informed-appointment scheduled.   ----- Message from Nori Riis, PA-C sent at 11/08/2018  7:48 AM EDT ----- Please let Jeremy [name] know that his PSA has returned to baseline at 3.3.   I would like to see him in three months, so that we can discuss and plan for his hydrocelectomy.

## 2018-11-08 NOTE — Telephone Encounter (Signed)
Please see anti-coag note for today. 

## 2018-11-08 NOTE — Telephone Encounter (Signed)
Patient is calling with his INR results. 3.6

## 2018-11-08 NOTE — Patient Instructions (Signed)
Please have a serving of greens today and continue current dosage of 1 mg EVERY DAY EXCEPT 1/2 TABLET ON [place]. Recheck INR in 1 week.

## 2018-11-09 ENCOUNTER — Encounter: Payer: Medicare Other | Attending: Cardiovascular Disease

## 2018-11-09 ENCOUNTER — Telehealth: Payer: Self-pay | Admitting: Family Medicine

## 2018-11-09 ENCOUNTER — Other Ambulatory Visit: Payer: Self-pay

## 2018-11-09 VITALS — Ht 69.75 in | Wt 188.5 lb

## 2018-11-09 DIAGNOSIS — I5020 Unspecified systolic (congestive) heart failure: Secondary | ICD-10-CM | POA: Insufficient documentation

## 2018-11-09 DIAGNOSIS — I2510 Atherosclerotic heart disease of native coronary artery without angina pectoris: Secondary | ICD-10-CM | POA: Diagnosis not present

## 2018-11-09 DIAGNOSIS — I255 Ischemic cardiomyopathy: Secondary | ICD-10-CM | POA: Diagnosis not present

## 2018-11-09 DIAGNOSIS — Z7982 Long term (current) use of aspirin: Secondary | ICD-10-CM | POA: Diagnosis not present

## 2018-11-09 DIAGNOSIS — Z79899 Other long term (current) drug therapy: Secondary | ICD-10-CM | POA: Insufficient documentation

## 2018-11-09 DIAGNOSIS — I120 Hypertensive chronic kidney disease with stage 5 chronic kidney disease or end stage renal disease: Secondary | ICD-10-CM | POA: Diagnosis not present

## 2018-11-09 DIAGNOSIS — N186 End stage renal disease: Secondary | ICD-10-CM | POA: Insufficient documentation

## 2018-11-09 DIAGNOSIS — E785 Hyperlipidemia, unspecified: Secondary | ICD-10-CM | POA: Insufficient documentation

## 2018-11-09 DIAGNOSIS — M1990 Unspecified osteoarthritis, unspecified site: Secondary | ICD-10-CM | POA: Diagnosis not present

## 2018-11-09 DIAGNOSIS — D631 Anemia in chronic kidney disease: Secondary | ICD-10-CM | POA: Diagnosis not present

## 2018-11-09 DIAGNOSIS — Z951 Presence of aortocoronary bypass graft: Secondary | ICD-10-CM | POA: Diagnosis not present

## 2018-11-09 DIAGNOSIS — Z7901 Long term (current) use of anticoagulants: Secondary | ICD-10-CM | POA: Diagnosis not present

## 2018-11-09 NOTE — Telephone Encounter (Signed)
Please contact [facility] and find out what we need to do to have them discontinue sending me the patient's INR results.  I am no longer managing his Coumadin.  This is being managed by cardiology.  There was a letter created previously to be faxed to [email or web address]  If we need to refax this please let me know so I can print it out again and I can sign it.  Thanks.

## 2018-11-09 NOTE — Progress Notes (Signed)
Cardiac Individual Treatment Plan  Patient Details  Name: Jeremy [name] MRN: [id number] Date of Birth: 1955/04/09 Referring Provider:     Cardiac Rehab from 11/09/2018 in [facility] Cardiac and Pulmonary Rehab  Referring Provider  [name]      Initial Encounter Date:    Cardiac Rehab from 11/09/2018 in [facility] Cardiac and Pulmonary Rehab  Date  11/09/18      Visit Diagnosis: S/P CABG x 3  Patient's Home Medications on Admission:  Current Outpatient Medications:  .  acetaminophen (TYLENOL) 500 MG tablet, Take 1,000 mg 2 (two) times daily as needed by mouth for moderate pain., Disp: , Rfl:  .  allopurinol (ZYLOPRIM) 300 MG tablet, Take 300 mg by mouth daily., Disp: , Rfl: 5 .  amiodarone (PACERONE) 200 MG tablet, Take 1 tablet (200 mg total) by mouth daily., Disp: 180 tablet, Rfl: 3 .  aspirin EC 81 MG EC tablet, Take 1 tablet (81 mg total) by mouth daily., Disp:  , Rfl:  .  AURYXIA 1 GM 210 MG(Fe) tablet, TAKE 2 TABLETS BY MOUTH THREE TIMES A DAY WITH MEALS, Disp: , Rfl:  .  cinacalcet (SENSIPAR) 30 MG tablet, Take 30 mg by mouth daily. , Disp: , Rfl:  .  Darbepoetin Alfa (ARANESP) 100 MCG/0.5ML SOSY injection, Inject 0.5 mLs (100 mcg total) into the vein every Monday with hemodialysis., Disp: 4.2 mL, Rfl:  .  Dextrose-Fructose-Sod Citrate (NAUZENE PO), Take 1-2 tablets 2 (two) times daily as needed by mouth (nausea)., Disp: , Rfl:  .  FLUoxetine (PROZAC) 10 MG capsule, TAKE 1 CAPSULE BY MOUTH EVERY DAY, Disp: 90 capsule, Rfl: 0 .  folic acid-vitamin b complex-vitamin c-selenium-zinc (DIALYVITE) 3 MG TABS tablet, Take 1 tablet by mouth daily., Disp: , Rfl:  .  lidocaine-prilocaine (EMLA) cream, Apply 1 application topically as needed (for fistula)., Disp: , Rfl:  .  metoprolol tartrate (LOPRESSOR) 25 MG tablet, Take 0.5 tablets (12.5 mg total) by mouth 2 (two) times daily., Disp: 30 tablet, Rfl: 1 .  midodrine (PROAMATINE) 10 MG tablet, Take 10 mg by mouth See admin instructions. Take on Monday,  Wednesday and Friday before dialysis, Disp: , Rfl:  .  omeprazole (PRILOSEC) 20 MG capsule, Take 20 mg by mouth daily., Disp: , Rfl:  .  predniSONE (DELTASONE) 5 MG tablet, Take 5 mg by mouth daily with breakfast. , Disp: , Rfl:  .  rosuvastatin (CRESTOR) 10 MG tablet, TAKE 1 TABLET (10 MG TOTAL) BY MOUTH DAILY AT 6 PM., Disp: 30 tablet, Rfl: 1 .  sevelamer carbonate (RENVELA) 800 MG tablet, Take 40,000 mg by mouth 3 (three) times daily with meals. 5 tabs per meal, Disp: , Rfl:  .  warfarin (COUMADIN) 1 MG tablet, Take 1 tablet (1 mg total) by mouth daily at 6 PM. (Patient taking differently: Take 1-2 mg by mouth See admin instructions. 1 mg all days , except 2 mg on Sunday..* OR AS DIRECTED), Disp: 90 tablet, Rfl: 1  Past Medical History: Past Medical History:  Diagnosis Date  . 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 Aortic stenosis    a. 2014 s/p mech AVR ([facility])-->chronic coumadin; b. 08/2018 Echo: Mild AS/AI. Nl fxn/gradient.  . Arthritis   . CAD (coronary artery disease)    a. 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 02/2018 MV ([facility]: large inf, infsept, inflat infarct w/ min rev, EF 30%; c. 06/2018 Cath: LM min irregs,  LAD 60p, 95/30/72m, LCX min irregs, RCA 40p, 32m 90d, RPAV 90, RPDA 95ost; c. 09/2018 CABGx3 (LIMA->LAD, VG->D1, VG->PDA).  . Chronic anticoagulation 06/2012   a. Coumadin in the setting of mech AVR and afib.  . Dialysis patient ([facility] 04/07/2006   Patient started HD around 1996,  had 1st transplant LLQ around 1998 until 2007 when they found renal cell cancer in transplant and both native kidneys, all were removed > went back on HD until 2nd transplant RLQ in 2014 which lasted 3 yrs; it was embolized for suspected acute rejection. [place] [facility] [place] [place]  . ESRD ([place] stage renal disease) ([facility]    a. 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 1999 s/p Kidney transplant; c. 2014  Bilat nephrectomy (transplanted kidney resected) in the setting of renal cell carcinoma; d. PD until 11/2017-->HD.  .Marland KitchenHFrEF (heart failure with reduced ejection fraction) [place] [place]    a. 07/2016 Echo: EF 55-60%; b. 02/2018 MV: EF 30%; c. 04/2018 Echo: EF 35%; d. 08/2018 Echo: EF 30-35%. Glob HK w/o rwma. Mildly reduced RV fxn. Mod to sev dil LA. Nl AoV fxn.  .Marland KitchenHx of colonoscopy    2009 by Dr. RLaural Golden Normal except for small submucosal lipoma. No evidence of polyps or colitis.  . Hyperlipidemia   . Hypertension   . Hypogonadism in male 07/25/2015  . Ischemic cardiomyopathy    a. 07/2016 Echo: EF 55-60%; b. 02/2018 MV: EF 30%; c. 04/2018 Echo: EF 35%; d. 08/2018 Echo: EF 30-35%.  . OSA (obstructive sleep apnea)    No CPAP  . Permanent atrial fibrillation    a. CHA2DS2VASc = 3-->Coumadin (also mech AVR); b. 06/2018 Amio started 2/2 elevated rates.  . [place] cell [place] 08/04/2016    Tobacco Use: Social History   Tobacco Use  Smoking Status Never Smoker  Smokeless Tobacco Never Used    Labs: Recent Review Flowsheet Data    Labs for ITP Cardiac and Pulmonary Rehab Latest Ref Rng & Units 09/16/2018 09/16/2018 09/16/2018 09/16/2018 09/16/2018   Cholestrol 0 - 200 mg/dL - - - - -   LDLCALC 0 - 99 mg/dL - - - - -   LDLDIRECT mg/dL - - - - -   HDL >39.00 mg/dL - - - - -   Trlycerides 0.0 - 149.0 mg/dL - - - - -   Hemoglobin A1c 4.8 - 5.6 % - - - - -   PHART 7.350 - 7.450 7.393 7.390 7.375 7.329(L) -   PCO2ART 32.0 - 48.0 mmHg 45.2 43.1 44.0 48.1(H) -   HCO3 20.0 - 28.0 mmol/L 27.6 26.3 25.7 25.2 -   TCO2 22 - 32 mmol/L _0 ACIDBASEDEF 0.0 - 2.0 mmol/L - - - 1.0 -   O2SAT % 98.0 98.0 99.0 99.0 -       Exercise Target Goals: Exercise Program Goal: Individual exercise prescription set using results from initial 6 min walk test and THRR while considering  patient's activity barriers and safety.   Exercise Prescription Goal: Initial exercise prescription builds to 30-45 minutes a  day of aerobic activity, 2-3 days per week.  Home exercise guidelines will be given to patient during program as part of exercise prescription that the participant will acknowledge.  Activity Barriers & Risk Stratification: Activity Barriers & Cardiac Risk Stratification - 11/04/18 1054      Activity Barriers & Cardiac Risk Stratification   Activity Barriers  Joint Problems;Muscular  Weakness    Cardiac Risk Stratification  High       6 Minute Walk: 6 Minute Walk    Row Name 11/09/18 1543         6 Minute Walk   Distance  1288 feet     Walk Time  6 minutes     # of Rest Breaks  0     MPH  2.44     METS  3.6     RPE  11     VO2 Peak  12.6     Symptoms  No     Resting HR  74 bpm     Resting BP  122/60     Max Ex. HR  116 bpm     Max Ex. BP  134/62     2 Minute Post BP  112/62        Oxygen Initial Assessment:   Oxygen Re-Evaluation:   Oxygen Discharge (Final Oxygen Re-Evaluation):   Initial Exercise Prescription: Initial Exercise Prescription - 11/09/18 1500      Date of Initial Exercise RX and Referring Provider   Date  11/09/18    Referring Provider  [name]      Treadmill   MPH  2.5    Grade  2    Minutes  15    METs  3.6      NuStep   Level  4    SPM  80    Minutes  15    METs  3.6      REL-XR   Level  3    Speed  50    Minutes  15    METs  3.6      Prescription Details   Frequency (times per week)  2    Duration  Progress to 30 minutes of continuous aerobic without signs/symptoms of physical distress      Intensity   THRR 40-80% of Max Heartrate  115-144    Ratings of Perceived Exertion  11-15    Perceived Dyspnea  0-4      Progression   Progression  Continue to progress workloads to maintain intensity without signs/symptoms of physical distress.      Resistance Training   Training Prescription  Yes    Weight  3 lb    Reps  10-15       Perform Capillary Blood Glucose checks as needed.  Exercise Prescription  Changes:   Exercise Comments:   Exercise Goals and Review: Exercise Goals    Row Name 11/09/18 1548             Exercise Goals   Increase Physical Activity  Yes       Expected Outcomes  Short Term: Attend rehab on a regular basis to increase amount of physical activity.;Long Term: Add in home exercise to make exercise part of routine and to increase amount of physical activity.;Long Term: Exercising regularly at least 3-5 days a week.       Increase Strength and Stamina  Yes       Intervention  Provide advice, education, support and counseling about physical activity/exercise needs.;Develop an individualized exercise prescription for aerobic and resistive training based on initial evaluation findings, risk stratification, comorbidities and participant's personal goals.       Expected Outcomes  Short Term: Increase workloads from initial exercise prescription for resistance, speed, and METs.;Short Term: Perform resistance training exercises routinely during rehab and add in resistance training at home;Long Term: Improve cardiorespiratory  fitness, muscular endurance and strength as measured by increased METs and functional capacity (6MWT)       Able to understand and use rate of perceived exertion (RPE) scale  Yes       Intervention  Provide education and explanation on how to use RPE scale       Expected Outcomes  Short Term: Able to use RPE daily in rehab to express subjective intensity level;Long Term:  Able to use RPE to guide intensity level when exercising independently       Able to understand and use Dyspnea scale  Yes       Intervention  Provide education and explanation on how to use Dyspnea scale       Expected Outcomes  Short Term: Able to use Dyspnea scale daily in rehab to express subjective sense of shortness of breath during exertion;Long Term: Able to use Dyspnea scale to guide intensity level when exercising independently       Knowledge and understanding of Target Heart  Rate Range (THRR)  Yes       Intervention  Provide education and explanation of THRR including how the numbers were predicted and where they are located for reference       Expected Outcomes  Short Term: Able to state/look up THRR;Short Term: Able to use daily as guideline for intensity in rehab;Long Term: Able to use THRR to govern intensity when exercising independently       Able to check pulse independently  Yes       Intervention  Provide education and demonstration on how to check pulse in carotid and radial arteries.;Review the importance of being able to check your own pulse for safety during independent exercise       Expected Outcomes  Short Term: Able to explain why pulse checking is important during independent exercise;Long Term: Able to check pulse independently and accurately       Understanding of Exercise Prescription  Yes       Intervention  Provide education, explanation, and written materials on patient's individual exercise prescription       Expected Outcomes  Short Term: Able to explain program exercise prescription;Long Term: Able to explain home exercise prescription to exercise independently          Exercise Goals Re-Evaluation :   Discharge Exercise Prescription (Final Exercise Prescription Changes):   Nutrition:  Target Goals: Understanding of nutrition guidelines, daily intake of sodium '1500mg'$ , cholesterol '200mg'$ , calories 30% from fat and 7% or less from saturated fats, daily to have 5 or more servings of fruits and vegetables.  Biometrics: Pre Biometrics - 11/09/18 1549      Pre Biometrics   Height  5' 9.75" (1.772 m)    Weight  188 lb 8 oz (85.5 kg)    BMI (Calculated)  27.23    Single Leg Stand  7.06 seconds        Nutrition Therapy Plan and Nutrition Goals: Nutrition Therapy & Goals - 11/09/18 1345      Nutrition Therapy   Diet  Low Na, HH, renal diet    Drug/Food Interactions  Coumadin/Vit K;Statins/Certain Fruits;Purine/Gout;Food/Disease    Phos/K+ and CKD End Stg   Protein (specify units)  70g    Fiber  30 grams    Whole Grain Foods  3 servings    Saturated Fats  12 max. grams    Fruits and Vegetables  5 servings/day    Sodium  1.5 grams      Personal  Nutrition Goals   Nutrition Goal  ST: reduce the sodium and edema (look over worksheet) LT: gain strength and get back to doing what he was doing before, reducing fluid retention.    Comments  Pt reported in the hospital (renal and HH diet) his fluid retention dropped and his stomach was not as "bloated". Pt has renal diettian follwoing him; went over CKD on dialysis basics and provided handouts. Went over gout basics and provided handout (pt reports organ meats, steak and pork chops are his most noticible triggers and is on medication). Pt is on coumadin and continues to eat a consistent amount of leafy greens (working with doctor). Discussed Hh eating in the context of his other issues, discussed how it will be a balancing act and we may have to fine tune things. Pt reports protein is WNL since including eggs, he takes his renal vitamin after dialysis, and he is on a phosphate binder. Pt reports eating cheerios or oatmeal on non-dialysis days and nothing on dialysis days for B. L both days is eggs with [place] bacon. S grits and peanut butter. D chicken or fish and vegtables. Pt reports sometimes eating fried food but cut out fried foods and red meat for the most part. Discussed low Na eating. Pt is also mindful of the amount of liquids he consumes (usually water, tea, or juice).      Intervention Plan   Intervention  Nutrition handout(s) given to patient.;Prescribe, educate and counsel regarding individualized specific dietary modifications aiming towards targeted core components such as weight, hypertension, lipid management, diabetes, heart failure and other comorbidities.    Expected Outcomes  Short Term Goal: A plan has been developed with personal nutrition goals set during  dietitian appointment.;Short Term Goal: Understand basic principles of dietary content, such as calories, fat, sodium, cholesterol and nutrients.;Long Term Goal: Adherence to prescribed nutrition plan.       Nutrition Assessments:   Nutrition Goals Re-Evaluation:   Nutrition Goals Discharge (Final Nutrition Goals Re-Evaluation):   Psychosocial: Target Goals: Acknowledge presence or absence of significant depression and/or stress, maximize coping skills, provide positive support system. Participant is able to verbalize types and ability to use techniques and skills needed for reducing stress and depression.   Initial Review & Psychosocial Screening: Initial Psych Review & Screening - 11/04/18 1049      Initial Review   Current issues with  Current Stress Concerns    Source of Stress Concerns  Chronic Illness    Comments  Malakye is on HD M,W,F. He has a good support system and is handling it well. He stays on top of managing his health, but it gets exhausting at times.      Family Dynamics   Good Support System?  Yes      Barriers   Psychosocial barriers to participate in program  There are no identifiable barriers or psychosocial needs.      Screening Interventions   Interventions  Encouraged to exercise;Provide feedback about the scores to participant    Expected Outcomes  Short Term goal: Utilizing psychosocial counselor, staff and physician to assist with identification of specific Stressors or current issues interfering with healing process. Setting desired goal for each stressor or current issue identified.;Long Term Goal: Stressors or current issues are controlled or eliminated.;Short Term goal: Identification and review with participant of any Quality of Life or Depression concerns found by scoring the questionnaire.;Long Term goal: The participant improves quality of Life and PHQ9 Scores as seen by post  scores and/or verbalization of changes       Quality of Life Scores:    Scores of 19 and below usually indicate a poorer quality of life in these areas.  A difference of  2-3 points is a clinically meaningful difference.  A difference of 2-3 points in the total score of the Quality of Life Index has been associated with significant improvement in overall quality of life, self-image, physical symptoms, and general health in studies assessing change in quality of life.  PHQ-9: Recent Review Flowsheet Data    Depression screen [facility] 2/9 11/09/2018 03/19/2018 05/17/2017   Decreased Interest 0 0 3   Down, Depressed, Hopeless 0 0 3   PHQ - 2 Score 0 0 6   Altered sleeping 0 - 3   Tired, decreased energy 0 - 3   Change in appetite 0 - 3   Feeling bad or failure about yourself  0 - 3   Trouble concentrating 0 - 2   Moving slowly or fidgety/restless 0 - 2   Suicidal thoughts 0 - 3   PHQ-9 Score 0 - 25   Difficult doing work/chores Not difficult at all - Extremely dIfficult     Interpretation of Total Score  Total Score Depression Severity:  1-4 = Minimal depression, 5-9 = Mild depression, 10-14 = Moderate depression, 15-19 = Moderately severe depression, 20-27 = Severe depression   Psychosocial Evaluation and Intervention:   Psychosocial Re-Evaluation:   Psychosocial Discharge (Final Psychosocial Re-Evaluation):   Vocational Rehabilitation: Provide vocational rehab assistance to qualifying candidates.   Vocational Rehab Evaluation & Intervention: Vocational Rehab - 11/04/18 1048      Initial Vocational Rehab Evaluation & Intervention   Assessment shows need for Vocational Rehabilitation  No       Education: Education Goals: Education classes will be provided on a variety of topics geared toward better understanding of heart health and risk factor modification. Participant will state understanding/return demonstration of topics presented as noted by education test scores.  Learning Barriers/Preferences: Learning Barriers/Preferences - 11/04/18 1048       Learning Barriers/Preferences   Learning Barriers  None    Learning Preferences  None       Education Topics:  AED/CPR: - Group verbal and written instruction with the use of models to demonstrate the basic use of the AED with the basic ABC's of resuscitation.   General Nutrition Guidelines/Fats and Fiber: -Group instruction provided by verbal, written material, models and posters to present the general guidelines for heart healthy nutrition. Gives an explanation and review of dietary fats and fiber.   Controlling Sodium/Reading Food Labels: -Group verbal and written material supporting the discussion of sodium use in heart healthy nutrition. Review and explanation with models, verbal and written materials for utilization of the food label.   Exercise Physiology & General Exercise Guidelines: - Group verbal and written instruction with models to review the exercise physiology of the cardiovascular system and associated critical values. Provides general exercise guidelines with specific guidelines to those with heart or lung disease.    Aerobic Exercise & Resistance Training: - Gives group verbal and written instruction on the various components of exercise. Focuses on aerobic and resistive training programs and the benefits of this training and how to safely progress through these programs..   Flexibility, Balance, Mind/Body Relaxation: Provides group verbal/written instruction on the benefits of flexibility and balance training, including mind/body exercise modes such as yoga, pilates and tai chi.  Demonstration and skill practice provided.  Stress and Anxiety: - Provides group verbal and written instruction about the health risks of elevated stress and causes of high stress.  Discuss the correlation between heart/lung disease and anxiety and treatment options. Review healthy ways to manage with stress and anxiety.   Depression: - Provides group verbal and written  instruction on the correlation between heart/lung disease and depressed mood, treatment options, and the stigmas associated with seeking treatment.   Anatomy & Physiology of the Heart: - Group verbal and written instruction and models provide basic cardiac anatomy and physiology, with the coronary electrical and arterial systems. Review of Valvular disease and Heart Failure   Cardiac Procedures: - Group verbal and written instruction to review commonly prescribed medications for heart disease. Reviews the medication, class of the drug, and side effects. Includes the steps to properly store meds and maintain the prescription regimen. (beta blockers and nitrates)   Cardiac Medications I: - Group verbal and written instruction to review commonly prescribed medications for heart disease. Reviews the medication, class of the drug, and side effects. Includes the steps to properly store meds and maintain the prescription regimen.   Cardiac Medications II: -Group verbal and written instruction to review commonly prescribed medications for heart disease. Reviews the medication, class of the drug, and side effects. (all other drug classes)    Go Sex-Intimacy & Heart Disease, Get SMART - Goal Setting: - Group verbal and written instruction through game format to discuss heart disease and the return to sexual intimacy. Provides group verbal and written material to discuss and apply goal setting through the application of the S.M.A.R.T. Method.   Other Matters of the Heart: - Provides group verbal, written materials and models to describe Stable Angina and Peripheral Artery. Includes description of the disease process and treatment options available to the cardiac patient.   Exercise & Equipment Safety: - Individual verbal instruction and demonstration of equipment use and safety with use of the equipment.   Cardiac Rehab from 11/09/2018 in [facility] Cardiac and Pulmonary Rehab  Date  11/09/18  Educator   AS  Instruction Review Code  1- Verbalizes Understanding      Infection Prevention: - Provides verbal and written material to individual with discussion of infection control including proper hand washing and proper equipment cleaning during exercise session.   Cardiac Rehab from 11/09/2018 in [facility] Cardiac and Pulmonary Rehab  Date  11/09/18  Educator  AS  Instruction Review Code  1- Verbalizes Understanding      Falls Prevention: - Provides verbal and written material to individual with discussion of falls prevention and safety.   Cardiac Rehab from 11/09/2018 in [facility] Cardiac and Pulmonary Rehab  Date  11/09/18  Educator  AS  Instruction Review Code  1- Verbalizes Understanding      Diabetes: - Individual verbal and written instruction to review signs/symptoms of diabetes, desired ranges of glucose level fasting, after meals and with exercise. Acknowledge that pre and post exercise glucose checks will be done for 3 sessions at entry of program.   Know Your Numbers and Risk Factors: -Group verbal and written instruction about important numbers in your health.  Discussion of what are risk factors and how they play a role in the disease process.  Review of Cholesterol, Blood Pressure, Diabetes, and BMI and the role they play in your overall health.   Sleep Hygiene: -Provides group verbal and written instruction about how sleep can affect your health.  Define sleep hygiene, discuss sleep cycles and impact of sleep  habits. Review good sleep hygiene tips.    Other: -Provides group and verbal instruction on various topics (see comments)   Knowledge Questionnaire Score:   Core Components/Risk Factors/Patient Goals at Admission: Personal Goals and Risk Factors at Admission - 11/09/18 1552      Core Components/Risk Factors/Patient Goals on Admission    Weight Management  Weight Maintenance    Heart Failure  Yes    Hypertension  Yes    Intervention  Provide education on lifestyle  modifcations including regular physical activity/exercise, weight management, moderate sodium restriction and increased consumption of fresh fruit, vegetables, and low fat dairy, alcohol moderation, and smoking cessation.;Monitor prescription use compliance.    Expected Outcomes  Short Term: Continued assessment and intervention until BP is < 140/30m HG in hypertensive participants. < 130/832mHG in hypertensive participants with diabetes, heart failure or chronic kidney disease.;Long Term: Maintenance of blood pressure at goal levels.    Intervention  Provide education and support for participant on nutrition & aerobic/resistive exercise along with prescribed medications to achieve LDL '70mg'$ , HDL >'40mg'$ .    Expected Outcomes  Long Term: Cholesterol controlled with medications as prescribed, with individualized exercise RX and with personalized nutrition plan. Value goals: LDL < '70mg'$ , HDL > 40 mg.    Stress  Yes       Core Components/Risk Factors/Patient Goals Review:    Core Components/Risk Factors/Patient Goals at Discharge (Final Review):    ITP Comments: ITP Comments    Row Name 11/04/18 1055 11/09/18 1542         ITP Comments  Virtual RN orientation completed. [facility] EP/RD orientation scheduled for 8/4 at 1:30  Completed initial 6 MWT and nutrition consult.  Initial ITP created and sent to Dr MaEmily FilbertMedical Director         Comments: initial ITP

## 2018-11-09 NOTE — Telephone Encounter (Signed)
Thanks. I thought we had already done that. Thanks for calling again.

## 2018-11-09 NOTE — Telephone Encounter (Signed)
I already faxed the letter to them to stop sending it and you signed it I have the confirmation that they received it aas well.  Ill call them tomorrow and speak with them.  Nina,cma

## 2018-11-09 NOTE — Patient Instructions (Addendum)
Patient Instructions  Patient Details  Name: Jeremy [name] MRN: [id number] Date of Birth: [birth year] Referring Provider:  Wellington Hampshire, MD  Below are your personal goals for exercise, nutrition, and risk factors. Our goal is to help you stay on track towards obtaining and maintaining these goals. We will be discussing your progress on these goals with you throughout the program.  Initial Exercise Prescription: Initial Exercise Prescription - 11/09/18 1500      Date of Initial Exercise RX and Referring Provider   Date  11/09/18    Referring Provider  [name]      Treadmill   MPH  2.5    Grade  2    Minutes  15    METs  3.6      NuStep   Level  4    SPM  80    Minutes  15    METs  3.6      REL-XR   Level  3    Speed  50    Minutes  15    METs  3.6      Prescription Details   Frequency (times per week)  2    Duration  Progress to 30 minutes of continuous aerobic without signs/symptoms of physical distress      Intensity   THRR 40-80% of Max Heartrate  115-144    Ratings of Perceived Exertion  11-15    Perceived Dyspnea  0-4      Progression   Progression  Continue to progress workloads to maintain intensity without signs/symptoms of physical distress.      Resistance Training   Training Prescription  Yes    Weight  3 lb    Reps  10-15       Exercise Goals: Frequency: Be able to perform aerobic exercise two to three times per week in program working toward 2-5 days per week of home exercise.  Intensity: Work with a perceived exertion of 11 (fairly light) - 15 (hard) while following your exercise prescription.  We will make changes to your prescription with you as you progress through the program.   Duration: Be able to do 30 to 45 minutes of continuous aerobic exercise in addition to a 5 minute warm-up and a 5 minute cool-down routine.   Nutrition Goals: Your personal nutrition goals will be established when you do your nutrition analysis with the  dietician.  The following are general nutrition guidelines to follow: Cholesterol < 200mg /day Sodium < 1500mg /day Fiber: Men over 50 yrs - 30 grams per day  Personal Goals: Personal Goals and Risk Factors at Admission - 11/09/18 1552      Core Components/Risk Factors/Patient Goals on Admission    Weight Management  Weight Maintenance    Intervention  Provide education on lifestyle modifcations including regular physical activity/exercise, weight management, moderate sodium restriction and increased consumption of fresh fruit, vegetables, and low fat dairy, alcohol moderation, and smoking cessation.;Monitor prescription use compliance.    Expected Outcomes  Short Term: Continued assessment and intervention until BP is < 140/98mm HG in hypertensive participants. < 130/5mm HG in hypertensive participants with diabetes, heart failure or chronic kidney disease.;Long Term: Maintenance of blood pressure at goal levels.    Intervention  Provide education and support for participant on nutrition & aerobic/resistive exercise along with prescribed medications to achieve LDL 70mg , HDL >40mg .    Expected Outcomes  Long Term: Cholesterol controlled with medications as prescribed, with individualized exercise RX and with personalized nutrition plan.  Value goals: LDL < 70mg , HDL > 40 mg.       Tobacco Use Initial Evaluation: Social History   Tobacco Use  Smoking Status Never Smoker  Smokeless Tobacco Never Used    Exercise Goals and Review: Exercise Goals    Row Name 11/09/18 1548             Exercise Goals   Increase Physical Activity  Yes       Expected Outcomes  Short Term: Attend rehab on a regular basis to increase amount of physical activity.;Long Term: Add in home exercise to make exercise part of routine and to increase amount of physical activity.;Long Term: Exercising regularly at least 3-5 days a week.       Increase Strength and Stamina  Yes       Intervention  Provide advice,  education, support and counseling about physical activity/exercise needs.;Develop an individualized exercise prescription for aerobic and resistive training based on initial evaluation findings, risk stratification, comorbidities and participant's personal goals.       Expected Outcomes  Short Term: Increase workloads from initial exercise prescription for resistance, speed, and METs.;Short Term: Perform resistance training exercises routinely during rehab and add in resistance training at home;Long Term: Improve cardiorespiratory fitness, muscular endurance and strength as measured by increased METs and functional capacity (6MWT)       Able to understand and use rate of perceived exertion (RPE) scale  Yes       Intervention  Provide education and explanation on how to use RPE scale       Expected Outcomes  Short Term: Able to use RPE daily in rehab to express subjective intensity level;Long Term:  Able to use RPE to guide intensity level when exercising independently       Able to understand and use Dyspnea scale  Yes       Intervention  Provide education and explanation on how to use Dyspnea scale       Expected Outcomes  Short Term: Able to use Dyspnea scale daily in rehab to express subjective sense of shortness of breath during exertion;Long Term: Able to use Dyspnea scale to guide intensity level when exercising independently       Knowledge and understanding of Target Heart Rate Range (THRR)  Yes       Intervention  Provide education and explanation of THRR including how the numbers were predicted and where they are located for reference       Expected Outcomes  Short Term: Able to state/look up THRR;Short Term: Able to use daily as guideline for intensity in rehab;Long Term: Able to use THRR to govern intensity when exercising independently       Able to check pulse independently  Yes       Intervention  Provide education and demonstration on how to check pulse in carotid and radial  arteries.;Review the importance of being able to check your own pulse for safety during independent exercise       Expected Outcomes  Short Term: Able to explain why pulse checking is important during independent exercise;Long Term: Able to check pulse independently and accurately       Understanding of Exercise Prescription  Yes       Intervention  Provide education, explanation, and written materials on patient's individual exercise prescription       Expected Outcomes  Short Term: Able to explain program exercise prescription;Long Term: Able to explain home exercise prescription to exercise independently  Copy of goals given to participant. Patient Instructions  Patient Details  Name: Jeremy [name] MRN: [id number] Date of Birth: [birth year] Referring Provider:  Wellington Hampshire, MD  Below are your personal goals for exercise, nutrition, and risk factors. Our goal is to help you stay on track towards obtaining and maintaining these goals. We will be discussing your progress on these goals with you throughout the program.  Initial Exercise Prescription: Initial Exercise Prescription - 11/09/18 1500      Date of Initial Exercise RX and Referring Provider   Date  11/09/18    Referring Provider  [name]      Treadmill   MPH  2.5    Grade  2    Minutes  15    METs  3.6      NuStep   Level  4    SPM  80    Minutes  15    METs  3.6      REL-XR   Level  3    Speed  50    Minutes  15    METs  3.6      Prescription Details   Frequency (times per week)  2    Duration  Progress to 30 minutes of continuous aerobic without signs/symptoms of physical distress      Intensity   THRR 40-80% of Max Heartrate  115-144    Ratings of Perceived Exertion  11-15    Perceived Dyspnea  0-4      Progression   Progression  Continue to progress workloads to maintain intensity without signs/symptoms of physical distress.      Resistance Training   Training Prescription  Yes    Weight   3 lb    Reps  10-15       Exercise Goals: Frequency: Be able to perform aerobic exercise two to three times per week in program working toward 2-5 days per week of home exercise.  Intensity: Work with a perceived exertion of 11 (fairly light) - 15 (hard) while following your exercise prescription.  We will make changes to your prescription with you as you progress through the program.   Duration: Be able to do 30 to 45 minutes of continuous aerobic exercise in addition to a 5 minute warm-up and a 5 minute cool-down routine.   Nutrition Goals: Your personal nutrition goals will be established when you do your nutrition analysis with the dietician.  The following are general nutrition guidelines to follow: Cholesterol < 200mg /day Sodium < 1500mg /day Fiber: Men over 50 yrs - 30 grams per day  Personal Goals: Personal Goals and Risk Factors at Admission - 11/09/18 1552      Core Components/Risk Factors/Patient Goals on Admission    Weight Management  Weight Maintenance    Heart Failure  Yes    Hypertension  Yes    Intervention  Provide education on lifestyle modifcations including regular physical activity/exercise, weight management, moderate sodium restriction and increased consumption of fresh fruit, vegetables, and low fat dairy, alcohol moderation, and smoking cessation.;Monitor prescription use compliance.    Expected Outcomes  Short Term: Continued assessment and intervention until BP is < 140/37mm HG in hypertensive participants. < 130/87mm HG in hypertensive participants with diabetes, heart failure or chronic kidney disease.;Long Term: Maintenance of blood pressure at goal levels.    Intervention  Provide education and support for participant on nutrition & aerobic/resistive exercise along with prescribed medications to achieve LDL 70mg , HDL >40mg .    Expected  Outcomes  Long Term: Cholesterol controlled with medications as prescribed, with individualized exercise RX and with  personalized nutrition plan. Value goals: LDL < 70mg , HDL > 40 mg.    Stress  Yes       Tobacco Use Initial Evaluation: Social History   Tobacco Use  Smoking Status Never Smoker  Smokeless Tobacco Never Used    Exercise Goals and Review: Exercise Goals    Row Name 11/09/18 1548             Exercise Goals   Increase Physical Activity  Yes       Expected Outcomes  Short Term: Attend rehab on a regular basis to increase amount of physical activity.;Long Term: Add in home exercise to make exercise part of routine and to increase amount of physical activity.;Long Term: Exercising regularly at least 3-5 days a week.       Increase Strength and Stamina  Yes       Intervention  Provide advice, education, support and counseling about physical activity/exercise needs.;Develop an individualized exercise prescription for aerobic and resistive training based on initial evaluation findings, risk stratification, comorbidities and participant's personal goals.       Expected Outcomes  Short Term: Increase workloads from initial exercise prescription for resistance, speed, and METs.;Short Term: Perform resistance training exercises routinely during rehab and add in resistance training at home;Long Term: Improve cardiorespiratory fitness, muscular endurance and strength as measured by increased METs and functional capacity (6MWT)       Able to understand and use rate of perceived exertion (RPE) scale  Yes       Intervention  Provide education and explanation on how to use RPE scale       Expected Outcomes  Short Term: Able to use RPE daily in rehab to express subjective intensity level;Long Term:  Able to use RPE to guide intensity level when exercising independently       Able to understand and use Dyspnea scale  Yes       Intervention  Provide education and explanation on how to use Dyspnea scale       Expected Outcomes  Short Term: Able to use Dyspnea scale daily in rehab to express subjective sense  of shortness of breath during exertion;Long Term: Able to use Dyspnea scale to guide intensity level when exercising independently       Knowledge and understanding of Target Heart Rate Range (THRR)  Yes       Intervention  Provide education and explanation of THRR including how the numbers were predicted and where they are located for reference       Expected Outcomes  Short Term: Able to state/look up THRR;Short Term: Able to use daily as guideline for intensity in rehab;Long Term: Able to use THRR to govern intensity when exercising independently       Able to check pulse independently  Yes       Intervention  Provide education and demonstration on how to check pulse in carotid and radial arteries.;Review the importance of being able to check your own pulse for safety during independent exercise       Expected Outcomes  Short Term: Able to explain why pulse checking is important during independent exercise;Long Term: Able to check pulse independently and accurately       Understanding of Exercise Prescription  Yes       Intervention  Provide education, explanation, and written materials on patient's individual exercise prescription  Expected Outcomes  Short Term: Able to explain program exercise prescription;Long Term: Able to explain home exercise prescription to exercise independently          Copy of goals given to participant.

## 2018-11-10 ENCOUNTER — Ambulatory Visit (INDEPENDENT_AMBULATORY_CARE_PROVIDER_SITE_OTHER): Payer: Self-pay | Admitting: Physician Assistant

## 2018-11-10 ENCOUNTER — Encounter: Payer: Self-pay | Admitting: Physician Assistant

## 2018-11-10 ENCOUNTER — Other Ambulatory Visit: Payer: Self-pay

## 2018-11-10 ENCOUNTER — Telehealth: Payer: Self-pay

## 2018-11-10 ENCOUNTER — Ambulatory Visit
Admission: RE | Admit: 2018-11-10 | Discharge: 2018-11-10 | Disposition: A | Discharge status: Home / Self Care | Payer: Medicare Other | Source: Ambulatory Visit | Attending: Thoracic Surgery (Cardiothoracic Vascular Surgery) | Admitting: Thoracic Surgery (Cardiothoracic Vascular Surgery)

## 2018-11-10 VITALS — BP 105/69 | HR 100 | Temp 97.5°F | Resp 18 | Ht 70.0 in | Wt 186.7 lb

## 2018-11-10 DIAGNOSIS — R079 Chest pain, unspecified: Secondary | ICD-10-CM | POA: Diagnosis not present

## 2018-11-10 DIAGNOSIS — D631 Anemia in chronic kidney disease: Secondary | ICD-10-CM | POA: Diagnosis not present

## 2018-11-10 DIAGNOSIS — N2581 Secondary hyperparathyroidism of renal origin: Secondary | ICD-10-CM | POA: Diagnosis not present

## 2018-11-10 DIAGNOSIS — Z951 Presence of aortocoronary bypass graft: Secondary | ICD-10-CM

## 2018-11-10 DIAGNOSIS — N186 End stage renal disease: Secondary | ICD-10-CM | POA: Diagnosis not present

## 2018-11-10 DIAGNOSIS — Z992 Dependence on renal dialysis: Secondary | ICD-10-CM | POA: Diagnosis not present

## 2018-11-10 NOTE — Progress Notes (Signed)
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 Roxan Hockey on 09/16/18.  He had an uncomplicated post-op course and was discharged on the 6th post-op day.  He was seen in follow up with Dr. Roxan Hockey about 3 weeks ago and was making appropriate progress. Since that visit, he has noticed a "popping" sensation at the lower end of his sternal incision. He reported this to his rehab nurse and was told he should get it checked out. He denies having any pain at any point along the incision and has had no cough, drainage, or fever.    Current Outpatient Medications  Medication Sig Dispense Refill  . acetaminophen (TYLENOL) 500 MG tablet Take 1,000 mg 2 (two) times daily as needed by mouth for moderate pain.    Marland Kitchen allopurinol (ZYLOPRIM) 300 MG tablet Take 300 mg by mouth daily.  5  . amiodarone (PACERONE) 200 MG tablet Take 1 tablet (200 mg total) by mouth daily. 180 tablet 3  . aspirin EC 81 MG EC tablet Take 1 tablet (81 mg total) by mouth daily.    Jeremy [name] 1 GM 210 MG(Fe) tablet TAKE 2 TABLETS BY MOUTH THREE TIMES A DAY WITH MEALS    . cinacalcet (SENSIPAR) 30 MG tablet Take 30 mg by mouth daily.     . Darbepoetin Alfa (ARANESP) 100 MCG/0.5ML SOSY injection Inject 0.5 mLs (100 mcg total) into the vein every Monday with hemodialysis. 4.2 mL   . Dextrose-Fructose-Sod Citrate (NAUZENE PO) Take 1-2 tablets 2 (two) times daily as needed by mouth (nausea).    Marland Kitchen FLUoxetine (PROZAC) 10 MG capsule TAKE 1 CAPSULE BY MOUTH EVERY DAY 90 capsule 0  . folic acid-vitamin b complex-vitamin c-selenium-zinc (DIALYVITE) 3 MG TABS tablet Take 1 tablet by mouth daily.    Marland Kitchen lidocaine-prilocaine (EMLA) cream Apply 1 application topically as needed (for fistula).    . metoprolol tartrate (LOPRESSOR) 25 MG tablet Take 0.5 tablets (12.5 mg total) by  mouth 2 (two) times daily. 30 tablet 1  . midodrine (PROAMATINE) 10 MG tablet Take 10 mg by mouth See admin instructions. Take on Monday, Wednesday and Friday before dialysis    . omeprazole (PRILOSEC) 20 MG capsule Take 20 mg by mouth daily.    . predniSONE (DELTASONE) 5 MG tablet Take 5 mg by mouth daily with breakfast.     . rosuvastatin (CRESTOR) 10 MG tablet TAKE 1 TABLET (10 MG TOTAL) BY MOUTH DAILY AT 6 PM. 30 tablet 1  . sevelamer carbonate (RENVELA) 800 MG tablet Take 40,000 mg by mouth 3 (three) times daily with meals. 5 tabs per meal    . warfarin (COUMADIN) 1 MG tablet Take 1 tablet (1 mg total) by mouth daily at 6 PM. (Patient taking differently: Take 1-2 mg by mouth See admin instructions. 1 mg all days , except 2 mg on Sunday..* OR AS DIRECTED) 90 tablet 1   No current facility-administered medications for this visit.     Physical Exam  VS:  BP 105/69    P100    RR 18   T 97.9F   SpO2 98% Heart: Irregular rhythm c/w his chronic afib. Crisp click of his mechanical aortic valve.   Chest: breath sounds clear  Wound: The sternotomy incision is well approximated and healed. The sternum is stable along its entire length.  There was a palpable click over the xyphoid process that duplicated the sensation that he has been having. There was no evidence of ventral herniation.   Diagnostic Tests:  EXAM: CHEST - 2 VIEW  COMPARISON:  10/19/2018  FINDINGS: Redemonstrated postoperative changes from CABG. Median sternotomy wires are intact and unchanged in alignment. Prosthetic aortic valve. Stable mild cardiomegaly. No focal airspace consolidation. No pleural effusion. No pneumothorax. No acute osseous abnormality.  IMPRESSION: No active cardiopulmonary disease.   Electronically Signed   By: Davina Poke M.D.   On: 11/10/2018 15:25   Impression / Plan: The CXR was carefully reviewed and compared with old images. There is no change in the alignment of the sternal  wires.  I suspect Jeremy [name] is sensing motion in the divided segments of the Xyphoid process that have not completely fused. He was re-assured that this should pose no danger and that it may or may not stabilize with time. There is no reason to deviate from previous instructions regarding activity. No follow up was scheduled but I asked him to contact us if he noted any evidence of wound break down, pain, drainage, or a bulge over the site.    Antony Odea, PA-C [facility] [phone]

## 2018-11-10 NOTE — Telephone Encounter (Signed)
Patient contacted the office with concerns about his sternum popping he stated.  It is at the lower end of his sternum, he was not sure if this was something to be concerned about.  Patient advised to come to the office today with a chest xray to be looked at.  He acknowledged receipt.

## 2018-11-10 NOTE — Patient Instructions (Signed)
No change in previous activity instructions. Call if you develop pain, redness, drainage, or a bulge at the site.

## 2018-11-11 ENCOUNTER — Other Ambulatory Visit: Payer: Self-pay

## 2018-11-11 DIAGNOSIS — E785 Hyperlipidemia, unspecified: Secondary | ICD-10-CM | POA: Diagnosis not present

## 2018-11-11 DIAGNOSIS — Z951 Presence of aortocoronary bypass graft: Secondary | ICD-10-CM | POA: Diagnosis not present

## 2018-11-11 DIAGNOSIS — D631 Anemia in chronic kidney disease: Secondary | ICD-10-CM | POA: Diagnosis not present

## 2018-11-11 DIAGNOSIS — I5020 Unspecified systolic (congestive) heart failure: Secondary | ICD-10-CM | POA: Diagnosis not present

## 2018-11-11 DIAGNOSIS — I2510 Atherosclerotic heart disease of native coronary artery without angina pectoris: Secondary | ICD-10-CM | POA: Diagnosis not present

## 2018-11-11 DIAGNOSIS — I120 Hypertensive chronic kidney disease with stage 5 chronic kidney disease or end stage renal disease: Secondary | ICD-10-CM | POA: Diagnosis not present

## 2018-11-11 NOTE — Progress Notes (Signed)
Daily Session Note  Patient Details  Name: JABEZ MOLNER MRN: [id number] Date of Birth: [birth year] Referring Provider:     Cardiac Rehab from 11/09/2018 in [facility]  Referring Provider  [name]      Encounter Date: 11/11/2018  Check In:      Social History   Tobacco Use  Smoking Status Never Smoker  Smokeless Tobacco Never Used    Goals Met:  Independence with exercise equipment Exercise tolerated well No report of cardiac concerns or symptoms Strength training completed today  Goals Unmet:  Not Applicable  Comments: First full day of exercise!  Patient was oriented to gym and equipment including functions, settings, policies, and procedures.  Patient's individual exercise prescription and treatment plan were reviewed.  All starting workloads were established based on the results of the 6 minute walk test done at initial orientation visit.  The plan for exercise progression was also introduced and progression will be customized based on patient's performance and goals.    Dr. Emily Filbert is Medical Director for [facility].

## 2018-11-12 DIAGNOSIS — Z992 Dependence on renal dialysis: Secondary | ICD-10-CM | POA: Diagnosis not present

## 2018-11-12 DIAGNOSIS — N186 End stage renal disease: Secondary | ICD-10-CM | POA: Diagnosis not present

## 2018-11-12 DIAGNOSIS — N2581 Secondary hyperparathyroidism of renal origin: Secondary | ICD-10-CM | POA: Diagnosis not present

## 2018-11-12 DIAGNOSIS — D631 Anemia in chronic kidney disease: Secondary | ICD-10-CM | POA: Diagnosis not present

## 2018-11-14 ENCOUNTER — Other Ambulatory Visit: Payer: Self-pay | Admitting: Surgical

## 2018-11-15 ENCOUNTER — Other Ambulatory Visit: Payer: Self-pay

## 2018-11-15 ENCOUNTER — Ambulatory Visit (INDEPENDENT_AMBULATORY_CARE_PROVIDER_SITE_OTHER): Payer: Medicare Other

## 2018-11-15 DIAGNOSIS — Z952 Presence of prosthetic heart valve: Secondary | ICD-10-CM | POA: Diagnosis not present

## 2018-11-15 DIAGNOSIS — Z5181 Encounter for therapeutic drug level monitoring: Secondary | ICD-10-CM | POA: Diagnosis not present

## 2018-11-15 DIAGNOSIS — D631 Anemia in chronic kidney disease: Secondary | ICD-10-CM | POA: Diagnosis not present

## 2018-11-15 DIAGNOSIS — N2581 Secondary hyperparathyroidism of renal origin: Secondary | ICD-10-CM | POA: Diagnosis not present

## 2018-11-15 DIAGNOSIS — Z992 Dependence on renal dialysis: Secondary | ICD-10-CM | POA: Diagnosis not present

## 2018-11-15 DIAGNOSIS — I4821 Permanent atrial fibrillation: Secondary | ICD-10-CM | POA: Diagnosis not present

## 2018-11-15 DIAGNOSIS — N186 End stage renal disease: Secondary | ICD-10-CM | POA: Diagnosis not present

## 2018-11-15 LAB — POCT INR: INR: 3.5 — AB (ref 2.0–3.0)

## 2018-11-15 NOTE — Patient Instructions (Signed)
Please continue current dosage of 1 mg EVERY DAY EXCEPT 1/2 TABLET ON MONDAYS & FRIDAYS. Recheck INR in 1 week - call [phone] w/ results.

## 2018-11-16 ENCOUNTER — Encounter: Payer: Medicare Other | Admitting: *Deleted

## 2018-11-16 DIAGNOSIS — D631 Anemia in chronic kidney disease: Secondary | ICD-10-CM | POA: Diagnosis not present

## 2018-11-16 DIAGNOSIS — I5020 Unspecified systolic (congestive) heart failure: Secondary | ICD-10-CM | POA: Diagnosis not present

## 2018-11-16 DIAGNOSIS — Z951 Presence of aortocoronary bypass graft: Secondary | ICD-10-CM | POA: Diagnosis not present

## 2018-11-16 DIAGNOSIS — I120 Hypertensive chronic kidney disease with stage 5 chronic kidney disease or end stage renal disease: Secondary | ICD-10-CM | POA: Diagnosis not present

## 2018-11-16 DIAGNOSIS — E785 Hyperlipidemia, unspecified: Secondary | ICD-10-CM | POA: Diagnosis not present

## 2018-11-16 DIAGNOSIS — I2510 Atherosclerotic heart disease of native coronary artery without angina pectoris: Secondary | ICD-10-CM | POA: Diagnosis not present

## 2018-11-16 NOTE — Progress Notes (Signed)
Daily Session Note  Patient Details  Name: Jeremy [name] MRN: [id number]  Date of Birth: 06-18-1955 Referring Provider:     Cardiac Rehab from 11/09/2018 in [facility]  Referring Provider  [name]      Encounter Date: 11/16/2018  Check In: Session Check In - 11/16/18 0753      Check-In   Supervising physician immediately available to respond to emergencies  See telemetry face sheet for immediately available ER MD    Location  [facility]-Cardiac & Pulmonary Rehab    Staff Present  Alberteen Sam, MA, RCEP, CCRP, CCET;Joseph Callaway;Heath Lark, RN, BSN, CCRP    Virtual Visit  No    Medication changes reported      No    Fall or balance concerns reported     No    Warm-up and Cool-down  Performed on first and last piece of equipment    Resistance Training Performed  Yes    VAD Patient?  No    PAD/SET Patient?  No      Pain Assessment   Currently in Pain?  No/denies          Social History   Tobacco Use  Smoking Status Never Smoker  Smokeless Tobacco Never Used    Goals Met:  Exercise tolerated well No report of cardiac concerns or symptoms Strength training completed today  Goals Unmet:  Not Applicable  Comments: Pt able to follow exercise prescription today without complaint.  Will continue to monitor for progression.    Dr. Emily Filbert is Medical Director for [facility].

## 2018-11-17 ENCOUNTER — Encounter: Payer: Self-pay | Admitting: Family Medicine

## 2018-11-17 ENCOUNTER — Other Ambulatory Visit: Payer: Self-pay

## 2018-11-17 ENCOUNTER — Ambulatory Visit (INDEPENDENT_AMBULATORY_CARE_PROVIDER_SITE_OTHER): Payer: Medicare Other | Admitting: Family Medicine

## 2018-11-17 ENCOUNTER — Encounter: Payer: Self-pay | Admitting: *Deleted

## 2018-11-17 VITALS — BP 125/70 | HR 94 | Temp 97.4°F | Ht 70.0 in | Wt 186.2 lb

## 2018-11-17 DIAGNOSIS — F325 Major depressive disorder, single episode, in full remission: Secondary | ICD-10-CM | POA: Diagnosis not present

## 2018-11-17 DIAGNOSIS — Z992 Dependence on renal dialysis: Secondary | ICD-10-CM

## 2018-11-17 DIAGNOSIS — L989 Disorder of the skin and subcutaneous tissue, unspecified: Secondary | ICD-10-CM | POA: Diagnosis not present

## 2018-11-17 DIAGNOSIS — Z85528 Personal history of other malignant neoplasm of kidney: Secondary | ICD-10-CM | POA: Diagnosis not present

## 2018-11-17 DIAGNOSIS — C649 Malignant neoplasm of unspecified kidney, except renal pelvis: Secondary | ICD-10-CM | POA: Diagnosis not present

## 2018-11-17 DIAGNOSIS — N186 End stage renal disease: Secondary | ICD-10-CM | POA: Diagnosis not present

## 2018-11-17 DIAGNOSIS — Z951 Presence of aortocoronary bypass graft: Secondary | ICD-10-CM

## 2018-11-17 DIAGNOSIS — D631 Anemia in chronic kidney disease: Secondary | ICD-10-CM | POA: Diagnosis not present

## 2018-11-17 DIAGNOSIS — N2581 Secondary hyperparathyroidism of renal origin: Secondary | ICD-10-CM | POA: Diagnosis not present

## 2018-11-17 DIAGNOSIS — E041 Nontoxic single thyroid nodule: Secondary | ICD-10-CM

## 2018-11-17 DIAGNOSIS — I2510 Atherosclerotic heart disease of native coronary artery without angina pectoris: Secondary | ICD-10-CM

## 2018-11-17 DIAGNOSIS — I4821 Permanent atrial fibrillation: Secondary | ICD-10-CM | POA: Diagnosis not present

## 2018-11-17 NOTE — Assessment & Plan Note (Signed)
Doing well status post CABG.  He will continue to see cardiology.

## 2018-11-17 NOTE — Assessment & Plan Note (Signed)
Suspect seborrheic keratosis.  Refer to dermatology at patient request for total skin exam.

## 2018-11-17 NOTE — Assessment & Plan Note (Signed)
Asymptomatic.  He will stop Prozac.  He will monitor for recurrence.

## 2018-11-17 NOTE — Progress Notes (Signed)
Cardiac Individual Treatment Plan  Patient Details  Name: Jeremy [name] MRN: [id number] Date of Birth: [birth year] Referring Provider:     Cardiac Rehab from 11/09/2018 in [facility] Cardiac and Pulmonary Rehab  Referring Provider  [name]      Initial Encounter Date:    Cardiac Rehab from 11/09/2018 in [facility] Cardiac and Pulmonary Rehab  Date  11/09/18      Visit Diagnosis: S/P CABG x 3  Patient's Home Medications on Admission:  Current Outpatient Medications:  .  acetaminophen (TYLENOL) 500 MG tablet, Take 1,000 mg 2 (two) times daily as needed by mouth for moderate pain., Disp: , Rfl:  .  allopurinol (ZYLOPRIM) 300 MG tablet, Take 300 mg by mouth daily., Disp: , Rfl: 5 .  amiodarone (PACERONE) 200 MG tablet, Take 1 tablet (200 mg total) by mouth daily., Disp: 180 tablet, Rfl: 3 .  aspirin EC 81 MG EC tablet, Take 1 tablet (81 mg total) by mouth daily., Disp:  , Rfl:  .  AURYXIA 1 GM 210 MG(Fe) tablet, TAKE 2 TABLETS BY MOUTH THREE TIMES A DAY WITH MEALS, Disp: , Rfl:  .  cinacalcet (SENSIPAR) 30 MG tablet, Take 30 mg by mouth daily. , Disp: , Rfl:  .  Darbepoetin Alfa (ARANESP) 100 MCG/0.5ML SOSY injection, Inject 0.5 mLs (100 mcg total) into the vein every Monday with hemodialysis., Disp: 4.2 mL, Rfl:  .  Dextrose-Fructose-Sod Citrate (NAUZENE PO), Take 1-2 tablets 2 (two) times daily as needed by mouth (nausea)., Disp: , Rfl:  .  FLUoxetine (PROZAC) 10 MG capsule, TAKE 1 CAPSULE BY MOUTH EVERY DAY, Disp: 90 capsule, Rfl: 0 .  folic acid-vitamin b complex-vitamin c-selenium-zinc (DIALYVITE) 3 MG TABS tablet, Take 1 tablet by mouth daily., Disp: , Rfl:  .  lidocaine-prilocaine (EMLA) cream, Apply 1 application topically as needed (for fistula)., Disp: , Rfl:  .  metoprolol tartrate (LOPRESSOR) 25 MG tablet, TAKE 1/2 TABLETS (12.5 MG TOTAL) BY MOUTH 2 (TWO) TIMES DAILY., Disp: 30 tablet, Rfl: 1 .  midodrine (PROAMATINE) 10 MG tablet, Take 10 mg by mouth See admin instructions. Take on Monday,  Wednesday and Friday before dialysis, Disp: , Rfl:  .  omeprazole (PRILOSEC) 20 MG capsule, Take 20 mg by mouth daily., Disp: , Rfl:  .  predniSONE (DELTASONE) 5 MG tablet, Take 5 mg by mouth daily with breakfast. , Disp: , Rfl:  .  rosuvastatin (CRESTOR) 10 MG tablet, TAKE 1 TABLET (10 MG TOTAL) BY MOUTH DAILY AT 6 PM., Disp: 30 tablet, Rfl: 1 .  sevelamer carbonate (RENVELA) 800 MG tablet, Take 40,000 mg by mouth 3 (three) times daily with meals. 5 tabs per meal, Disp: , Rfl:  .  warfarin (COUMADIN) 1 MG tablet, Take 1 tablet (1 mg total) by mouth daily at 6 PM. (Patient taking differently: Take 1-2 mg by mouth See admin instructions. 1 mg all days , except 2 mg on Sunday..* OR AS DIRECTED), Disp: 90 tablet, Rfl: 1  Past Medical History: Past Medical History:  Diagnosis Date  . 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 Aortic stenosis    a. 2014 s/p mech AVR ([facility])-->chronic coumadin; b. 08/2018 Echo: Mild AS/AI. Nl fxn/gradient.  . Arthritis   . CAD (coronary artery disease)    a. 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 02/2018 MV ([facility]: large inf, infsept, inflat infarct w/ min rev, EF 30%; c. 06/2018 Cath: LM min irregs,  LAD 60p, 95/30/72m, LCX min irregs, RCA 40p, 32m 90d, RPAV 90, RPDA 95ost; c. 09/2018 CABGx3 (LIMA->LAD, VG->D1, VG->PDA).  . Chronic anticoagulation 06/2012   a. Coumadin in the setting of mech AVR and afib.  . Dialysis patient ([facility] 04/07/2006   Patient started HD around 1996,  had 1st transplant LLQ around 1998 until 2007 when they found renal cell cancer in transplant and both native kidneys, all were removed > went back on HD until 2nd transplant RLQ in 2014 which lasted 3 yrs; it was embolized for suspected acute rejection. [place] [facility] [place] [place]  . ESRD (end stage renal disease) ([facility]    a. 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 1999 s/p Kidney transplant; c. 2014  Bilat nephrectomy (transplanted kidney resected) in the setting of renal cell carcinoma; d. PD until 11/2017-->HD.  .Marland KitchenHFrEF (heart failure with reduced ejection fraction) [place]    a. 07/2016 Echo: EF 55-60%; b. 02/2018 MV: EF 30%; c. 04/2018 Echo: EF 35%; d. 08/2018 Echo: EF 30-35%. Glob HK w/o rwma. Mildly reduced RV fxn. Mod to sev dil LA. Nl AoV fxn.  .Marland KitchenHx of colonoscopy    2009 by Dr. RLaural Golden Normal except for small submucosal lipoma. No evidence of polyps or colitis.  . Hyperlipidemia   . Hypertension   . Hypogonadism in male 07/25/2015  . Ischemic cardiomyopathy    a. 07/2016 Echo: EF 55-60%; b. 02/2018 MV: EF 30%; c. 04/2018 Echo: EF 35%; d. 08/2018 Echo: EF 30-35%.  . OSA (obstructive sleep apnea)    No CPAP  . Permanent atrial fibrillation    a. CHA2DS2VASc = 3-->Coumadin (also mech AVR); b. 06/2018 Amio started 2/2 elevated rates.  . [place] cell [place] 08/04/2016    Tobacco Use: Social History   Tobacco Use  Smoking Status Never Smoker  Smokeless Tobacco Never Used    Labs: Recent Review Flowsheet Data    Labs for ITP Cardiac and Pulmonary Rehab Latest Ref Rng & Units 09/16/2018 09/16/2018 09/16/2018 09/16/2018 09/16/2018   Cholestrol 0 - 200 mg/dL - - - - -   LDLCALC 0 - 99 mg/dL - - - - -   LDLDIRECT mg/dL - - - - -   HDL >39.00 mg/dL - - - - -   Trlycerides 0.0 - 149.0 mg/dL - - - - -   Hemoglobin A1c 4.8 - 5.6 % - - - - -   PHART 7.350 - 7.450 7.393 7.390 7.375 7.329(L) -   PCO2ART 32.0 - 48.0 mmHg 45.2 43.1 44.0 48.1(H) -   HCO3 20.0 - 28.0 mmol/L 27.6 26.3 25.7 25.2 -   TCO2 22 - 32 mmol/L _0 ACIDBASEDEF 0.0 - 2.0 mmol/L - - - 1.0 -   O2SAT % 98.0 98.0 99.0 99.0 -       Exercise Target Goals: Exercise Program Goal: Individual exercise prescription set using results from initial 6 min walk test and THRR while considering  patient's activity barriers and safety.   Exercise Prescription Goal: Initial exercise prescription builds to 30-45 minutes a  day of aerobic activity, 2-3 days per week.  Home exercise guidelines will be given to patient during program as part of exercise prescription that the participant will acknowledge.  Activity Barriers & Risk Stratification: Activity Barriers & Cardiac Risk Stratification - 11/04/18 1054      Activity Barriers & Cardiac Risk Stratification   Activity Barriers  Joint Problems;Muscular  Weakness    Cardiac Risk Stratification  High       6 Minute Walk: 6 Minute Walk    Row Name 11/09/18 1543         6 Minute Walk   Distance  1288 feet     Walk Time  6 minutes     # of Rest Breaks  0     MPH  2.44     METS  3.6     RPE  11     VO2 Peak  12.6     Symptoms  No     Resting HR  74 bpm     Resting BP  122/60     Max Ex. HR  116 bpm     Max Ex. BP  134/62     2 Minute Post BP  112/62        Oxygen Initial Assessment:   Oxygen Re-Evaluation:   Oxygen Discharge (Final Oxygen Re-Evaluation):   Initial Exercise Prescription: Initial Exercise Prescription - 11/09/18 1500      Date of Initial Exercise RX and Referring Provider   Date  11/09/18    Referring Provider  [name]      Treadmill   MPH  2.5    Grade  2    Minutes  15    METs  3.6      NuStep   Level  4    SPM  80    Minutes  15    METs  3.6      REL-XR   Level  3    Speed  50    Minutes  15    METs  3.6      Prescription Details   Frequency (times per week)  2    Duration  Progress to 30 minutes of continuous aerobic without signs/symptoms of physical distress      Intensity   THRR 40-80% of Max Heartrate  115-144    Ratings of Perceived Exertion  11-15    Perceived Dyspnea  0-4      Progression   Progression  Continue to progress workloads to maintain intensity without signs/symptoms of physical distress.      Resistance Training   Training Prescription  Yes    Weight  3 lb    Reps  10-15       Perform Capillary Blood Glucose checks as needed.  Exercise Prescription Changes: Exercise  Prescription Changes    Row Name 11/10/18 1200             Response to Exercise   Blood Pressure (Admit)  122/60       Blood Pressure (Exercise)  134/62       Blood Pressure (Exit)  112/62       Heart Rate (Admit)  74 bpm       Heart Rate (Exercise)  116 bpm       Heart Rate (Exit)  83 bpm       Rating of Perceived Exertion (Exercise)  11       Symptoms  none          Exercise Comments: Exercise Comments    Row Name 11/11/18 0840 11/16/18 4132         Exercise Comments  First full day of exercise!  Patient was oriented to gym and equipment including functions, settings, policies, and procedures.  Patient's individual exercise prescription and treatment plan were reviewed.  All starting workloads were established based  on the results of the 6 minute walk test done at initial orientation visit.  The plan for exercise progression was also introduced and progression will be customized based on patient's performance and goals.  Rei stated that after he went home last week his chest was very sore.  Remains sore today. He was advised to decrease or not use weights today.  Also to call his doctor if pain continues         Exercise Goals and Review: Exercise Goals    Row Name 11/09/18 1548             Exercise Goals   Increase Physical Activity  Yes       Expected Outcomes  Short Term: Attend rehab on a regular basis to increase amount of physical activity.;Long Term: Add in home exercise to make exercise part of routine and to increase amount of physical activity.;Long Term: Exercising regularly at least 3-5 days a week.       Increase Strength and Stamina  Yes       Intervention  Provide advice, education, support and counseling about physical activity/exercise needs.;Develop an individualized exercise prescription for aerobic and resistive training based on initial evaluation findings, risk stratification, comorbidities and participant's personal goals.       Expected Outcomes   Short Term: Increase workloads from initial exercise prescription for resistance, speed, and METs.;Short Term: Perform resistance training exercises routinely during rehab and add in resistance training at home;Long Term: Improve cardiorespiratory fitness, muscular endurance and strength as measured by increased METs and functional capacity (6MWT)       Able to understand and use rate of perceived exertion (RPE) scale  Yes       Intervention  Provide education and explanation on how to use RPE scale       Expected Outcomes  Short Term: Able to use RPE daily in rehab to express subjective intensity level;Long Term:  Able to use RPE to guide intensity level when exercising independently       Able to understand and use Dyspnea scale  Yes       Intervention  Provide education and explanation on how to use Dyspnea scale       Expected Outcomes  Short Term: Able to use Dyspnea scale daily in rehab to express subjective sense of shortness of breath during exertion;Long Term: Able to use Dyspnea scale to guide intensity level when exercising independently       Knowledge and understanding of Target Heart Rate Range (THRR)  Yes       Intervention  Provide education and explanation of THRR including how the numbers were predicted and where they are located for reference       Expected Outcomes  Short Term: Able to state/look up THRR;Short Term: Able to use daily as guideline for intensity in rehab;Long Term: Able to use THRR to govern intensity when exercising independently       Able to check pulse independently  Yes       Intervention  Provide education and demonstration on how to check pulse in carotid and radial arteries.;Review the importance of being able to check your own pulse for safety during independent exercise       Expected Outcomes  Short Term: Able to explain why pulse checking is important during independent exercise;Long Term: Able to check pulse independently and accurately       Understanding  of Exercise Prescription  Yes       Intervention  Provide education, explanation, and written materials on patient's individual exercise prescription       Expected Outcomes  Short Term: Able to explain program exercise prescription;Long Term: Able to explain home exercise prescription to exercise independently          Exercise Goals Re-Evaluation : Exercise Goals Re-Evaluation    Row Name 11/11/18 0840             Exercise Goal Re-Evaluation   Exercise Goals Review  Increase Physical Activity;Increase Strength and Stamina;Able to understand and use rate of perceived exertion (RPE) scale;Knowledge and understanding of Target Heart Rate Range (THRR);Understanding of Exercise Prescription       Comments  Reviewed RPE scale, THR and program prescription with pt today.  Pt voiced understanding and was given a copy of goals to take home.       Expected Outcomes  Short: Use RPE daily to regulate intensity. Long: Follow program prescription in THR.          Discharge Exercise Prescription (Final Exercise Prescription Changes): Exercise Prescription Changes - 11/10/18 1200      Response to Exercise   Blood Pressure (Admit)  122/60    Blood Pressure (Exercise)  134/62    Blood Pressure (Exit)  112/62    Heart Rate (Admit)  74 bpm    Heart Rate (Exercise)  116 bpm    Heart Rate (Exit)  83 bpm    Rating of Perceived Exertion (Exercise)  11    Symptoms  none       Nutrition:  Target Goals: Understanding of nutrition guidelines, daily intake of sodium <1528m, cholesterol <2046m calories 30% from fat and 7% or less from saturated fats, daily to have 5 or more servings of fruits and vegetables.  Biometrics: Pre Biometrics - 11/09/18 1549      Pre Biometrics   Height  5' 9.75" (1.772 m)    Weight  188 lb 8 oz (85.5 kg)    BMI (Calculated)  27.23    Single Leg Stand  7.06 seconds        Nutrition Therapy Plan and Nutrition Goals: Nutrition Therapy & Goals - 11/09/18 1345       Nutrition Therapy   Diet  Low Na, HH, renal diet    Drug/Food Interactions  Coumadin/Vit K;Statins/Certain Fruits;Purine/Gout;Food/Disease   Phos/K+ and CKD End Stg   Protein (specify units)  70g    Fiber  30 grams    Whole Grain Foods  3 servings    Saturated Fats  12 max. grams    Fruits and Vegetables  5 servings/day    Sodium  1.5 grams      Personal Nutrition Goals   Nutrition Goal  ST: reduce the sodium and edema (look over worksheet) LT: gain strength and get back to doing what he was doing before, reducing fluid retention.    Comments  Pt reported in the hospital (renal and HH diet) his fluid retention dropped and his stomach was not as "bloated". Pt has renal diettian follwoing him; went over CKD on dialysis basics and provided handouts. Went over gout basics and provided handout (pt reports organ meats, steak and pork chops are his most noticible triggers and is on medication). Pt is on coumadin and continues to eat a consistent amount of leafy greens (working with doctor). Discussed Hh eating in the context of his other issues, discussed how it will be a balancing act and we may have to fine tune things. Pt reports protein  is WNL since including eggs, he takes his renal vitamin after dialysis, and he is on a phosphate binder. Pt reports eating cheerios or oatmeal on non-dialysis days and nothing on dialysis days for B. L both days is eggs with [place] bacon. S grits and peanut butter. D chicken or fish and vegtables. Pt reports sometimes eating fried food but cut out fried foods and red meat for the most part. Discussed low Na eating. Pt is also mindful of the amount of liquids he consumes (usually water, tea, or juice).      Intervention Plan   Intervention  Nutrition handout(s) given to patient.;Prescribe, educate and counsel regarding individualized specific dietary modifications aiming towards targeted core components such as weight, hypertension, lipid management, diabetes, heart  failure and other comorbidities.    Expected Outcomes  Short Term Goal: A plan has been developed with personal nutrition goals set during dietitian appointment.;Short Term Goal: Understand basic principles of dietary content, such as calories, fat, sodium, cholesterol and nutrients.;Long Term Goal: Adherence to prescribed nutrition plan.       Nutrition Assessments: Nutrition Assessments - 11/11/18 1138      MEDFICTS Scores   Pre Score  43       Nutrition Goals Re-Evaluation:   Nutrition Goals Discharge (Final Nutrition Goals Re-Evaluation):   Psychosocial: Target Goals: Acknowledge presence or absence of significant depression and/or stress, maximize coping skills, provide positive support system. Participant is able to verbalize types and ability to use techniques and skills needed for reducing stress and depression.   Initial Review & Psychosocial Screening: Initial Psych Review & Screening - 11/04/18 1049      Initial Review   Current issues with  Current Stress Concerns    Source of Stress Concerns  Chronic Illness    Comments  Facundo is on HD M,W,F. He has a good support system and is handling it well. He stays on top of managing his health, but it gets exhausting at times.      Family Dynamics   Good Support System?  Yes      Barriers   Psychosocial barriers to participate in program  There are no identifiable barriers or psychosocial needs.      Screening Interventions   Interventions  Encouraged to exercise;Provide feedback about the scores to participant    Expected Outcomes  Short Term goal: Utilizing psychosocial counselor, staff and physician to assist with identification of specific Stressors or current issues interfering with healing process. Setting desired goal for each stressor or current issue identified.;Long Term Goal: Stressors or current issues are controlled or eliminated.;Short Term goal: Identification and review with participant of any Quality of Life or  Depression concerns found by scoring the questionnaire.;Long Term goal: The participant improves quality of Life and PHQ9 Scores as seen by post scores and/or verbalization of changes       Quality of Life Scores:  Quality of Life - 11/11/18 1138      Quality of Life   Select  Quality of Life      Quality of Life Scores   Health/Function Pre  20.73 %    Socioeconomic Pre  19 %    Psych/Spiritual Pre  20.86 %    Family Pre  20.6 %    GLOBAL Pre  20.38 %      Scores of 19 and below usually indicate a poorer quality of life in these areas.  A difference of  2-3 points is a clinically meaningful difference.  A  difference of 2-3 points in the total score of the Quality of Life Index has been associated with significant improvement in overall quality of life, self-image, physical symptoms, and general health in studies assessing change in quality of life.  PHQ-9: Recent Review Flowsheet Data    Depression screen [facility] 2/9 11/09/2018 03/19/2018 05/17/2017   Decreased Interest 0 0 3   Down, Depressed, Hopeless 0 0 3   PHQ - 2 Score 0 0 6   Altered sleeping 0 - 3   Tired, decreased energy 0 - 3   Change in appetite 0 - 3   Feeling bad or failure about yourself  0 - 3   Trouble concentrating 0 - 2   Moving slowly or fidgety/restless 0 - 2   Suicidal thoughts 0 - 3   PHQ-9 Score 0 - 25   Difficult doing work/chores Not difficult at all - Extremely dIfficult     Interpretation of Total Score  Total Score Depression Severity:  1-4 = Minimal depression, 5-9 = Mild depression, 10-14 = Moderate depression, 15-19 = Moderately severe depression, 20-27 = Severe depression   Psychosocial Evaluation and Intervention:   Psychosocial Re-Evaluation:   Psychosocial Discharge (Final Psychosocial Re-Evaluation):   Vocational Rehabilitation: Provide vocational rehab assistance to qualifying candidates.   Vocational Rehab Evaluation & Intervention: Vocational Rehab - 11/04/18 1048      Initial  Vocational Rehab Evaluation & Intervention   Assessment shows need for Vocational Rehabilitation  No       Education: Education Goals: Education classes will be provided on a variety of topics geared toward better understanding of heart health and risk factor modification. Participant will state understanding/return demonstration of topics presented as noted by education test scores.  Learning Barriers/Preferences: Learning Barriers/Preferences - 11/04/18 1048      Learning Barriers/Preferences   Learning Barriers  None    Learning Preferences  None       Education Topics:  AED/CPR: - Group verbal and written instruction with the use of models to demonstrate the basic use of the AED with the basic ABC's of resuscitation.   General Nutrition Guidelines/Fats and Fiber: -Group instruction provided by verbal, written material, models and posters to present the general guidelines for heart healthy nutrition. Gives an explanation and review of dietary fats and fiber.   Controlling Sodium/Reading Food Labels: -Group verbal and written material supporting the discussion of sodium use in heart healthy nutrition. Review and explanation with models, verbal and written materials for utilization of the food label.   Exercise Physiology & General Exercise Guidelines: - Group verbal and written instruction with models to review the exercise physiology of the cardiovascular system and associated critical values. Provides general exercise guidelines with specific guidelines to those with heart or lung disease.    Aerobic Exercise & Resistance Training: - Gives group verbal and written instruction on the various components of exercise. Focuses on aerobic and resistive training programs and the benefits of this training and how to safely progress through these programs..   Flexibility, Balance, Mind/Body Relaxation: Provides group verbal/written instruction on the benefits of flexibility and  balance training, including mind/body exercise modes such as yoga, pilates and tai chi.  Demonstration and skill practice provided.   Stress and Anxiety: - Provides group verbal and written instruction about the health risks of elevated stress and causes of high stress.  Discuss the correlation between heart/lung disease and anxiety and treatment options. Review healthy ways to manage with stress and anxiety.  Depression: - Provides group verbal and written instruction on the correlation between heart/lung disease and depressed mood, treatment options, and the stigmas associated with seeking treatment.   Anatomy & Physiology of the Heart: - Group verbal and written instruction and models provide basic cardiac anatomy and physiology, with the coronary electrical and arterial systems. Review of Valvular disease and Heart Failure   Cardiac Procedures: - Group verbal and written instruction to review commonly prescribed medications for heart disease. Reviews the medication, class of the drug, and side effects. Includes the steps to properly store meds and maintain the prescription regimen. (beta blockers and nitrates)   Cardiac Medications I: - Group verbal and written instruction to review commonly prescribed medications for heart disease. Reviews the medication, class of the drug, and side effects. Includes the steps to properly store meds and maintain the prescription regimen.   Cardiac Medications II: -Group verbal and written instruction to review commonly prescribed medications for heart disease. Reviews the medication, class of the drug, and side effects. (all other drug classes)    Go Sex-Intimacy & Heart Disease, Get SMART - Goal Setting: - Group verbal and written instruction through game format to discuss heart disease and the return to sexual intimacy. Provides group verbal and written material to discuss and apply goal setting through the application of the S.M.A.R.T.  Method.   Other Matters of the Heart: - Provides group verbal, written materials and models to describe Stable Angina and Peripheral Artery. Includes description of the disease process and treatment options available to the cardiac patient.   Exercise & Equipment Safety: - Individual verbal instruction and demonstration of equipment use and safety with use of the equipment.   Cardiac Rehab from 11/09/2018 in [facility] Cardiac and Pulmonary Rehab  Date  11/09/18  Educator  AS  Instruction Review Code  1- Verbalizes Understanding      Infection Prevention: - Provides verbal and written material to individual with discussion of infection control including proper hand washing and proper equipment cleaning during exercise session.   Cardiac Rehab from 11/09/2018 in [facility] Cardiac and Pulmonary Rehab  Date  11/09/18  Educator  AS  Instruction Review Code  1- Verbalizes Understanding      Falls Prevention: - Provides verbal and written material to individual with discussion of falls prevention and safety.   Cardiac Rehab from 11/09/2018 in [facility] Cardiac and Pulmonary Rehab  Date  11/09/18  Educator  AS  Instruction Review Code  1- Verbalizes Understanding      Diabetes: - Individual verbal and written instruction to review signs/symptoms of diabetes, desired ranges of glucose level fasting, after meals and with exercise. Acknowledge that pre and post exercise glucose checks will be done for 3 sessions at entry of program.   Know Your Numbers and Risk Factors: -Group verbal and written instruction about important numbers in your health.  Discussion of what are risk factors and how they play a role in the disease process.  Review of Cholesterol, Blood Pressure, Diabetes, and BMI and the role they play in your overall health.   Sleep Hygiene: -Provides group verbal and written instruction about how sleep can affect your health.  Define sleep hygiene, discuss sleep cycles and impact of sleep  habits. Review good sleep hygiene tips.    Other: -Provides group and verbal instruction on various topics (see comments)   Knowledge Questionnaire Score: Knowledge Questionnaire Score - 11/11/18 1138      Knowledge Questionnaire Score   Pre Score  21/26  Angina, Exercise.Depression missed       Core Components/Risk Factors/Patient Goals at Admission: Personal Goals and Risk Factors at Admission - 11/09/18 1552      Core Components/Risk Factors/Patient Goals on Admission    Weight Management  Weight Maintenance    Heart Failure  Yes    Hypertension  Yes    Intervention  Provide education on lifestyle modifcations including regular physical activity/exercise, weight management, moderate sodium restriction and increased consumption of fresh fruit, vegetables, and low fat dairy, alcohol moderation, and smoking cessation.;Monitor prescription use compliance.    Expected Outcomes  Short Term: Continued assessment and intervention until BP is < 140/21m HG in hypertensive participants. < 130/829mHG in hypertensive participants with diabetes, heart failure or chronic kidney disease.;Long Term: Maintenance of blood pressure at goal levels.    Intervention  Provide education and support for participant on nutrition & aerobic/resistive exercise along with prescribed medications to achieve LDL <7083mHDL >61m29m  Expected Outcomes  Long Term: Cholesterol controlled with medications as prescribed, with individualized exercise RX and with personalized nutrition plan. Value goals: LDL < 70mg38mL > 40 mg.    Stress  Yes       Core Components/Risk Factors/Patient Goals Review:    Core Components/Risk Factors/Patient Goals at Discharge (Final Review):    ITP Comments: ITP Comments    Row Name 11/04/18 1055 11/09/18 1542 11/11/18 0840 11/16/18 0825 11/17/18 0548   ITP Comments  Virtual RN orientation completed. Diagnosis can be found in [facility] EP/RD orientation scheduled for 8/4 at 1:30   Completed initial 6 MWT and nutrition consult.  Initial ITP created and sent to Dr Mark Emily Filbertical Director  First full day of exercise!  Patient was oriented to gym and equipment including functions, settings, policies, and procedures.  Patient's individual exercise prescription and treatment plan were reviewed.  All starting workloads were established based on the results of the 6 minute walk test done at initial orientation visit.  The plan for exercise progression was also introduced and progression will be customized based on patient's performance and goals.  Oryn stated that after he went home last week his chest was very sore.  Remains sore today. He was advised to decrease or not use weights today.  Also to call his doctor if pain continues.  30 Day Review Completed today. Continue with ITP unless changed by Medical Director review.      Comments:

## 2018-11-17 NOTE — Progress Notes (Signed)
Tommi Rumps, MD Phone: [phone]  Jeremy [name] is a 63 y.o. male who presents today for follow-up.  Depression: Patient denies depression.  He wonders if he can come off the Prozac.  A. fib/CABG/CAD: Patient has done well following CABG.  He has healed well and notes no chest pain, shortness of breath, edema, palpitations, orthopnea, or PND.  He has held his metoprolol the last 3 days due to his blood pressure dropping low when he takes it.  He continues on warfarin and amiodarone managed by cardiology.  Skin lesions: Patient notes he has had skin lesions on his right posterior shoulder for quite some time though never had anything done with them.  They have been evaluated by dermatology.  He would like to establish with a [place] dermatologist.  End-stage renal disease/history of renal cancer: He continues on dialysis and notes this is doing quite well.  He continues to follow with nephrology.  He would like a referral to a local oncologist.  Thyroid nodule: The patient was previously following with a surgeon at [facility].  He notes she left and he would like to set up follow-up with somebody locally.  Social History   Tobacco Use  Smoking Status Never Smoker  Smokeless Tobacco Never Used     ROS see history of present illness  Objective  Physical Exam Vitals:   11/17/18 1543  BP: 125/70  Pulse: 94  Temp: (!) 97.4 F (36.3 C)  SpO2: 98%    BP Readings from Last 3 Encounters:  11/17/18 125/70  11/10/18 105/69  11/04/18 117/65   Wt Readings from Last 3 Encounters:  11/17/18 186 lb 3.2 oz (84.5 kg)  11/10/18 186 lb 11.7 oz (84.7 kg)  11/09/18 188 lb 8 oz (85.5 kg)    Physical Exam Constitutional:      General: He is not in acute distress.    Appearance: He is not diaphoretic.  Cardiovascular:     Rate and Rhythm: Normal rate. Rhythm irregularly irregular.     Heart sounds: Normal heart sounds.  Pulmonary:     Effort: Pulmonary effort is normal.   Breath sounds: Normal breath sounds.  Skin:    General: Skin is warm and dry.       Neurological:     Mental Status: He is alert.      Assessment/Plan: Please see individual problem list.  Atrial fibrillation ([place]) [I48.91] Currently in A. fib.  We will continue to follow with cardiology and continue his current regimen through them.  CAD (coronary artery disease) Doing well status post CABG.  He will continue to see cardiology.  Thyroid nodule Refer to ENT.  ESRD on dialysis [facility]) He will continue to see nephrology.  Malignant neoplasm of unspecified kidney, except renal pelvis (HCC) History of renal cancer.  We will refer to local oncology.  Depression Asymptomatic.  He will stop Prozac.  He will monitor for recurrence.  Skin lesion Suspect seborrheic keratosis.  Refer to dermatology at patient request for total skin exam.    Orders Placed This Encounter  Procedures  . Ambulatory referral to ENT    Referral Priority:   Routine    Referral Type:   Consultation    Referral Reason:   Specialty Services Required    Requested Specialty:   Otolaryngology    Number of Visits Requested:   1  . Ambulatory referral to Oncology    Referral Priority:   Routine    Referral Type:   Consultation  Referral Reason:   Specialty Services Required    Number of Visits Requested:   1  . Ambulatory referral to Dermatology    Referral Priority:   Routine    Referral Type:   Consultation    Referral Reason:   Specialty Services Required    Requested Specialty:   Dermatology    Number of Visits Requested:   1    No orders of the defined types were placed in this encounter.    Tommi Rumps, MD [place]

## 2018-11-17 NOTE — Assessment & Plan Note (Signed)
Currently in A. fib.  We will continue to follow with cardiology and continue his current regimen through them.

## 2018-11-17 NOTE — Assessment & Plan Note (Signed)
History of renal cancer.  We will refer to local oncology.

## 2018-11-17 NOTE — Assessment & Plan Note (Signed)
Refer to ENT

## 2018-11-17 NOTE — Patient Instructions (Signed)
Nice to see you. I will get your referrals placed.  Please stop the prozac and let us know if you develop recurrent depression.

## 2018-11-17 NOTE — Assessment & Plan Note (Signed)
He will continue to see nephrology. 

## 2018-11-18 ENCOUNTER — Encounter: Payer: Medicare Other | Admitting: *Deleted

## 2018-11-18 DIAGNOSIS — Z951 Presence of aortocoronary bypass graft: Secondary | ICD-10-CM

## 2018-11-18 DIAGNOSIS — I5020 Unspecified systolic (congestive) heart failure: Secondary | ICD-10-CM | POA: Diagnosis not present

## 2018-11-18 DIAGNOSIS — I120 Hypertensive chronic kidney disease with stage 5 chronic kidney disease or end stage renal disease: Secondary | ICD-10-CM | POA: Diagnosis not present

## 2018-11-18 DIAGNOSIS — E785 Hyperlipidemia, unspecified: Secondary | ICD-10-CM | POA: Diagnosis not present

## 2018-11-18 DIAGNOSIS — D631 Anemia in chronic kidney disease: Secondary | ICD-10-CM | POA: Diagnosis not present

## 2018-11-18 DIAGNOSIS — I2510 Atherosclerotic heart disease of native coronary artery without angina pectoris: Secondary | ICD-10-CM | POA: Diagnosis not present

## 2018-11-18 NOTE — Progress Notes (Signed)
Daily Session Note  Patient Details  Name: Jeremy [name] MRN: [id number] Date of Birth: 08/04/1955 Referring Provider:     Cardiac Rehab from 11/09/2018 in [facility] Cardiac and Pulmonary Rehab  Referring Provider  Jeremy [name]      Encounter Date: 11/18/2018  Check In: Session Check In - 11/18/18 0805      Check-In   Supervising physician immediately available to respond to emergencies  See telemetry face sheet for immediately available ER MD    Location  [facility]-Cardiac & Pulmonary Rehab    Staff Present  Jeremy Lark, RN, BSN, CCRP;Jeremy [name], BS, RRT, CPFT;Jeremy Coronado [place], MA, RCEP, CCRP, CCET;Jeremy [name], BA, ACSM CEP, Exercise Physiologist    Virtual Visit  No    Medication changes reported      No    Fall or balance concerns reported     No    Warm-up and Cool-down  Performed on first and last piece of equipment    Resistance Training Performed  Yes    VAD Patient?  No    PAD/SET Patient?  No      Pain Assessment   Currently in Pain?  No/denies          Social History   Tobacco Use  Smoking Status Never Smoker  Smokeless Tobacco Never Used    Goals Met:  Independence with exercise equipment Exercise tolerated well No report of cardiac concerns or symptoms Strength training completed today  Goals Unmet:  Not Applicable  Comments: Pt able to follow exercise prescription today without complaint.  Will continue to monitor for progression.  Reviewed home exercise with pt today.  Pt plans to walk at home for exercise.  Reviewed THR, pulse, RPE, sign and symptoms, NTG use, and when to call 911 or MD.  Also discussed weather considerations and indoor options.  Pt voiced understanding.  Dr. Emily [name] is Medical Director for [facility].

## 2018-11-19 DIAGNOSIS — N186 End stage renal disease: Secondary | ICD-10-CM | POA: Diagnosis not present

## 2018-11-19 DIAGNOSIS — Z992 Dependence on renal dialysis: Secondary | ICD-10-CM | POA: Diagnosis not present

## 2018-11-19 DIAGNOSIS — N2581 Secondary hyperparathyroidism of renal origin: Secondary | ICD-10-CM | POA: Diagnosis not present

## 2018-11-19 DIAGNOSIS — D631 Anemia in chronic kidney disease: Secondary | ICD-10-CM | POA: Diagnosis not present

## 2018-11-22 ENCOUNTER — Ambulatory Visit: Payer: Medicare Other | Admitting: Internal Medicine

## 2018-11-22 ENCOUNTER — Telehealth: Payer: Self-pay | Admitting: Cardiovascular Disease

## 2018-11-22 ENCOUNTER — Ambulatory Visit (INDEPENDENT_AMBULATORY_CARE_PROVIDER_SITE_OTHER): Payer: Medicare Other

## 2018-11-22 DIAGNOSIS — D631 Anemia in chronic kidney disease: Secondary | ICD-10-CM | POA: Diagnosis not present

## 2018-11-22 DIAGNOSIS — Z952 Presence of prosthetic heart valve: Secondary | ICD-10-CM

## 2018-11-22 DIAGNOSIS — I4821 Permanent atrial fibrillation: Secondary | ICD-10-CM | POA: Diagnosis not present

## 2018-11-22 DIAGNOSIS — Z5181 Encounter for therapeutic drug level monitoring: Secondary | ICD-10-CM | POA: Diagnosis not present

## 2018-11-22 DIAGNOSIS — N2581 Secondary hyperparathyroidism of renal origin: Secondary | ICD-10-CM | POA: Diagnosis not present

## 2018-11-22 DIAGNOSIS — Z992 Dependence on renal dialysis: Secondary | ICD-10-CM | POA: Diagnosis not present

## 2018-11-22 DIAGNOSIS — N186 End stage renal disease: Secondary | ICD-10-CM | POA: Diagnosis not present

## 2018-11-22 LAB — POCT INR: INR: 4.2 — AB (ref 2.0–3.0)

## 2018-11-22 NOTE — Patient Instructions (Signed)
Please skip coumadin tonight, then START NEW DOSAGE of 1 mg EVERY DAY EXCEPT 1/2 TABLET ON MONDAYS, [place]. Recheck INR in 1 week - call [phone] w/ results.

## 2018-11-22 NOTE — Telephone Encounter (Signed)
Patient calling with INR reading - 4.2

## 2018-11-22 NOTE — Telephone Encounter (Signed)
Please see anti-coag note for today. 

## 2018-11-23 ENCOUNTER — Other Ambulatory Visit: Payer: Self-pay | Admitting: Thoracic Surgery (Cardiothoracic Vascular Surgery)

## 2018-11-23 ENCOUNTER — Other Ambulatory Visit: Payer: Self-pay

## 2018-11-23 ENCOUNTER — Encounter: Payer: Self-pay | Admitting: Cardiovascular Disease

## 2018-11-23 ENCOUNTER — Ambulatory Visit (INDEPENDENT_AMBULATORY_CARE_PROVIDER_SITE_OTHER): Payer: Medicare Other | Admitting: Cardiovascular Disease

## 2018-11-23 VITALS — BP 110/66 | HR 79 | Ht 70.0 in | Wt 188.0 lb

## 2018-11-23 DIAGNOSIS — I482 Chronic atrial fibrillation, unspecified: Secondary | ICD-10-CM

## 2018-11-23 DIAGNOSIS — E785 Hyperlipidemia, unspecified: Secondary | ICD-10-CM | POA: Diagnosis not present

## 2018-11-23 DIAGNOSIS — I5020 Unspecified systolic (congestive) heart failure: Secondary | ICD-10-CM | POA: Diagnosis not present

## 2018-11-23 DIAGNOSIS — I5022 Chronic systolic (congestive) heart failure: Secondary | ICD-10-CM

## 2018-11-23 DIAGNOSIS — Z951 Presence of aortocoronary bypass graft: Secondary | ICD-10-CM

## 2018-11-23 DIAGNOSIS — I2510 Atherosclerotic heart disease of native coronary artery without angina pectoris: Secondary | ICD-10-CM | POA: Diagnosis not present

## 2018-11-23 DIAGNOSIS — I120 Hypertensive chronic kidney disease with stage 5 chronic kidney disease or end stage renal disease: Secondary | ICD-10-CM | POA: Diagnosis not present

## 2018-11-23 DIAGNOSIS — D631 Anemia in chronic kidney disease: Secondary | ICD-10-CM | POA: Diagnosis not present

## 2018-11-23 NOTE — Patient Instructions (Addendum)
Medication Instructions:  Your physician recommends that you continue on your current medications as directed. Please refer to the Current Medication list given to you today.  If you need a refill on your cardiac medications before your next appointment, please call your pharmacy.   Lab work: None ordered If you have labs (blood work) drawn today and your tests are completely normal, you will receive your results only by: Marland Kitchen MyChart Message (if you have MyChart) OR . A paper copy in the mail If you have any lab test that is abnormal or we need to change your treatment, we will call you to review the results.  Testing/Procedures: None ordered  Follow-Up: At [facility], you and your health needs are our priority.  As part of our continuing mission to provide you with exceptional heart care, we have created designated Provider Care Teams.  These Care Teams include your primary Cardiologist (physician) and Advanced Practice Providers (APPs -  Physician Assistants and Nurse Practitioners) who all work together to provide you with the care you need, when you need it. You will need a follow up appointment in 3 months. You may see Kathlyn [place], MD or one of the following Advanced Practice Providers on your designated Care Team:   Murray Hodgkins, NP Christell Faith, PA-C . Marrianne Mood, PA-C  Any Other Special Instructions Will Be Listed Below (If Applicable). N/A

## 2018-11-23 NOTE — Progress Notes (Signed)
Cardiology Office Note   Date:  11/23/2018   ID:  MARS SCHEAFFER, DOB 1955-07-14, MRN [id number]  PCP:  Leone Haven, MD  Cardiologist:   Kathlyn Sacramento, MD   Chief Complaint  Patient presents with  . Other    6-8 week follow up. Meds reviewed verbally with patient.       History of Present Illness: Jeremy [name] is a 63 y.o. male who is here today for follow-up visit regarding coronary artery disease.   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 Cardiac catheterization in 2013 showed nonobstructive RCA and left circumflex disease.  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  LAD had 85% mid stenosis which was treated with a 2.75 x 28 mm Xience drug-eluting stent. 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  He had an echocardiogram done in January 2020 which showed a drop in ejection fraction from normal to 35%, moderately reduced RV function, moderate biatrial enlargement, St. Jude mechanical aortic valve with mean gradient of 21 mmHg, mild mitral regurgitation and mild pulmonary hypertension.  He was on peritoneal dialysis before and was switched to hemodialysis in August 2019 due to ineffective fluid removal.   The patient reported significant worsening of angina during my initial evaluation late last year.  He had a [place] Myoview done in November 2019 at [facility] which was abnormal high risk study with large defect in the inferior, inferoseptal and inferolateral myocardium which was fixed with minimal reversibility.  EF was 30%.  His previous EF was normal.  The patient was placed on Imdur  with no improvement in his symptoms. I proceeded with a right and left cardiac catheterization in March, 2010 which showed severe heavily calcified two-vessel coronary artery disease involving the LAD and right coronary artery which were not optimal for atherectomy given significant tortuosity and diffuse disease.  Right heart catheterization showed mildly elevated filling pressures, moderate pulmonary hypertension and normal cardiac output.  We had difficulty controlling his rate with A. fib and was placed on amiodarone for rate control.  He ultimately underwent CABG x3 in June of this year by Dr. Roxan Hockey.  He had hypotension post surgery especially with dialysis.  He was started on midodrine.  He has been doing extremely well with significant improvement in angina.  He used to take multiple nitroglycerin daily before surgery but he has not used any sublingual nitroglycerin since CABG.  He is attending cardiac rehab.  He is no longer hypotensive and midodrine was discontinued.    Past Medical History:  Diagnosis Date  . 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 Aortic stenosis    a. 2014 s/p mech AVR ([facility])-->chronic coumadin; b. 08/2018 Echo: Mild AS/AI. Nl fxn/gradient.  . Arthritis   . CAD (coronary artery disease)    a. 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 02/2018 [facility]): large inf, infsept, inflat infarct w/ min rev, EF 30%; c. 06/2018 Cath: LM min irregs, LAD 60p, 95/30/60m, LCX min irregs, RCA 40p, 31m, 90d, RPAV 90, RPDA 95ost; c. 09/2018 CABGx3 (LIMA->LAD, VG->D1, VG->PDA).  Marland Kitchen  Chronic anticoagulation 06/2012   a. Coumadin in the setting of mech AVR and afib.  . Dialysis patient [facility]) 04/07/2006   Patient started HD around 1996,  had 1st transplant LLQ around 1998 until 2007 when they found renal cell cancer in transplant and both native kidneys, all were removed > went back on HD until 2nd transplant RLQ in 2014 which  lasted 3 yrs; it was embolized for suspected acute rejection. Is currently on HD MWF in [place] w/ [facility] nephrology.  . ESRD (end stage renal disease) ([place])    a. 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 1999 s/p Kidney transplant; c. 2014 Bilat nephrectomy (transplanted kidney resected) in the setting of renal cell carcinoma; d. PD until 11/2017-->HD.  Marland Kitchen HFrEF (heart failure with reduced ejection fraction) ([place])    a. 07/2016 Echo: EF 55-60%; b. 02/2018 MV: EF 30%; c. 04/2018 Echo: EF 35%; d. 08/2018 Echo: EF 30-35%. Glob HK w/o rwma. Mildly reduced RV fxn. Mod to sev dil LA. Nl AoV fxn.  Marland Kitchen Hx of colonoscopy    2009 by Dr. Laural Golden. Normal except for small submucosal lipoma. No evidence of polyps or colitis.  . Hyperlipidemia   . Hypertension   . Hypogonadism in male 07/25/2015  . Ischemic cardiomyopathy    a. 07/2016 Echo: EF 55-60%; b. 02/2018 MV: EF 30%; c. 04/2018 Echo: EF 35%; d. 08/2018 Echo: EF 30-35%.  . OSA (obstructive sleep apnea)    No CPAP  . Permanent atrial fibrillation    a. CHA2DS2VASc = 3-->Coumadin (also mech AVR); b. 06/2018 Amio started 2/2 elevated rates.  . Renal cell carcinoma ([place]) 08/04/2016    Past Surgical History:  Procedure Laterality Date  . AORTIC VALVE REPLACEMENT  3 /11 /2014   At [place]    . CAPD INSERTION N/A 02/19/2017   Procedure: LAPAROSCOPIC INSERTION CONTINUOUS AMBULATORY PERITONEAL DIALYSIS  (CAPD) CATHETER;  Surgeon: Algernon Huxley, MD;  Location: [facility]S;  Service: General;  Laterality: N/A;  . CORONARY ANGIOPLASTY WITH STENT PLACEMENT    . CORONARY ARTERY BYPASS GRAFT N/A 09/16/2018   Procedure: CORONARY ARTERY BYPASS GRAFT times three      with left internal mammary artery and endoharvest of right leg greater saphenous vein;  Surgeon: Melrose Nakayama, MD;  Location: [place];  Service: Open Heart Surgery;  Laterality: N/A;  . DG AV DIALYSIS  SHUNT ACCESS EXIST*L* OR     left arm  . EPIGASTRIC  HERNIA REPAIR N/A 02/19/2017   Procedure: HERNIA REPAIR EPIGASTRIC ADULT;  Surgeon: Robert Bellow, MD;  Location: [facility] OR[facility];  Service: General;  Laterality: N/A;  . EYE SURGERY     bilateral cataract  . HERNIA REPAIR     UMBILICAL HERNIA  . HERNIA REPAIR  02/19/2017  . INNER EAR SURGERY     20 years ago  . KIDNEY TRANSPLANT    . Peritoneal Dialysis access placement  02/19/2017  . REMOVAL OF A DIALYSIS CATHETER N/A 11/23/2017   Procedure: REMOVAL OF A PERITONEAL DIALYSIS CATHETER;  Surgeon: Algernon Huxley, MD;  Location: [facility];  Service: Vascular;  Laterality: N/A;  . RIGHT/LEFT HEART CATH AND CORONARY ANGIOGRAPHY Bilateral 06/07/2018   Procedure: RIGHT/LEFT HEART CATH AND CORONARY ANGIOGRAPHY;  Surgeon: Wellington Hampshire, MD;  Location: [place] [facility];  Service: Cardiovascular;  Laterality: Bilateral;  . TEE WITHOUT CARDIOVERSION N/A 07/21/2016   Procedure: TRANSESOPHAGEAL ECHOCARDIOGRAM (TEE);  Surgeon: Wellington Hampshire,  MD;  Location: [facility];  Service: Cardiovascular;  Laterality: N/A;  . TEE WITHOUT CARDIOVERSION N/A 09/16/2018   Procedure: TRANSESOPHAGEAL ECHOCARDIOGRAM (TEE);  Surgeon: Melrose Nakayama, MD;  Location: [place];  Service: Open Heart Surgery;  Laterality: N/A;     Current Outpatient Medications  Medication Sig Dispense Refill  . acetaminophen (TYLENOL) 500 MG tablet Take 1,000 mg 2 (two) times daily as needed by mouth for moderate pain.    Marland Kitchen allopurinol (ZYLOPRIM) 300 MG tablet Take 300 mg by mouth daily.  5  . amiodarone (PACERONE) 200 MG tablet Take 1 tablet (200 mg total) by mouth daily. 180 tablet 3  . aspirin EC 81 MG EC tablet Take 1 tablet (81 mg total) by mouth daily.    Lorin Picket 1 GM 210 MG(Fe) tablet TAKE 2 TABLETS BY MOUTH THREE TIMES A DAY WITH MEALS    . cinacalcet (SENSIPAR) 30 MG tablet Take 30 mg by mouth daily.     . Darbepoetin Alfa (ARANESP) 100 MCG/0.5ML SOSY injection Inject 0.5 mLs (100 mcg total) into the vein every Monday with  hemodialysis. 4.2 mL   . Dextrose-Fructose-Sod Citrate (NAUZENE PO) Take 1-2 tablets 2 (two) times daily as needed by mouth (nausea).    . folic acid-vitamin b complex-vitamin c-selenium-zinc (DIALYVITE) 3 MG TABS tablet Take 1 tablet by mouth daily.    Marland Kitchen lidocaine-prilocaine (EMLA) cream Apply 1 application topically as needed (for fistula).    . metoprolol tartrate (LOPRESSOR) 25 MG tablet TAKE 1/2 TABLETS (12.5 MG TOTAL) BY MOUTH 2 (TWO) TIMES DAILY. 30 tablet 1  . omeprazole (PRILOSEC) 20 MG capsule Take 20 mg by mouth daily.    . predniSONE (DELTASONE) 5 MG tablet Take 5 mg by mouth daily with breakfast.     . rosuvastatin (CRESTOR) 10 MG tablet TAKE 1 TABLET (10 MG TOTAL) BY MOUTH DAILY AT 6 PM. 30 tablet 1  . sevelamer carbonate (RENVELA) 800 MG tablet Take 40,000 mg by mouth 3 (three) times daily with meals. 5 tabs per meal    . warfarin (COUMADIN) 1 MG tablet Take 1 tablet (1 mg total) by mouth daily at 6 PM. (Patient taking differently: Take 1-2 mg by mouth See admin instructions. 1 mg all days , except 2 mg on Sunday..* OR AS DIRECTED) 90 tablet 1   No current facility-administered medications for this visit.     Allergies:   Statins and Sertraline    Social History:  The patient  reports that he has never smoked. He has never used smokeless tobacco. He reports that he does not drink alcohol or use drugs.   Family History:  The patient's family history includes Cancer in his mother; Diabetes in his father; Heart disease in his father.    ROS:  Please see the history of present illness.   Otherwise, review of systems are positive for none.   All other systems are reviewed and negative.    PHYSICAL EXAM: VS:  BP 110/66 (BP Location: Left Arm, Patient Position: Sitting, Cuff Size: Normal)   Pulse 79   Ht 5\' 10"  (1.778 m)   Wt 188 lb (85.3 kg)   BMI 26.98 kg/m  , BMI Body mass index is 26.98 kg/m. GEN: Well nourished, well developed, in no acute distress  HEENT: normal   Neck: Mild JVD, no carotid bruits, or masses Cardiac: Irregularly irregular; normal mechanical heart sounds with 1 out of 6 systolic murmur at the aortic area . Respiratory:  clear to auscultation bilaterally,  normal work of breathing GI: soft, nontender, nondistended, + BS MS: no deformity or atrophy  Skin: warm and dry, no rash Neuro:  Strength and sensation are intact Psych: euthymic mood, full affect Vascular: The patient has a fistula in the left arm.    EKG:  EKG is ordered today. The ekg ordered today demonstrates atrial fibrillation with ventricular rate of 79 Bpm.  Right bundle branch block with inferior T wave changes suggestive of ischemia.   Recent Labs: 09/13/2018: ALT 28 09/17/2018: Magnesium 2.1 09/22/2018: BUN 31; Creatinine, Ser 7.61; Hemoglobin 8.4; Platelets 121; Potassium 4.0; Sodium 138    Lipid Panel    Component Value Date/Time   CHOL 176 08/08/2016 0946   TRIG 158.0 (H) 08/08/2016 0946   HDL 50.50 08/08/2016 0946   CHOLHDL 3 08/08/2016 0946   VLDL 31.6 08/08/2016 0946   LDLCALC 94 08/08/2016 0946   LDLDIRECT 61.0 08/11/2017 1447      Wt Readings from Last 3 Encounters:  11/23/18 188 lb (85.3 kg)  11/17/18 186 lb 3.2 oz (84.5 kg)  11/10/18 186 lb 11.7 oz (84.7 kg)       No flowsheet data found.    ASSESSMENT AND PLAN:  1.  Coronary artery disease involving native coronary arteries without angina: Status post CABG in June.  He is doing extremely well with significant improvement in symptoms.  He is attending cardiac rehab.  Continue current dose of metoprolol 12.5 mg twice daily.  2.  Chronic systolic heart failure: Most likely due to ischemic cardiomyopathy.  EF was 30 to 35%.  Continue small dose metoprolol for now.  He is not on an ACE inhibitor, ARB or other vasodilators due to low blood pressure.  I will consider repeat echocardiogram in 3 months to evaluate his EF.  He is not a good candidate for an ICD placement given that he is on dialysis  but might be a good candidate for a subcutaneous ICD.  3.  Chronic atrial fibrillation: He is on amiodarone mostly for rate control given relatively low blood pressure.  He is only able to take small dose of metoprolol.  My plan is to try to increase metoprolol during next visit and discontinue amiodarone to minimize the risk of long-term toxicity.  4.  Hyperlipidemia: Intolerance to statins due to myalgia.  He is tolerating rosuvastatin 10 mg daily.  5.  Mechanical aortic valve replacement: Continue long-term anticoagulation with warfarin.  6.  End-stage renal disease on hemodialysis Monday Wednesday and Friday.  Disposition:   FU with me in 3 months  Signed,  Kathlyn Sacramento, MD  11/23/2018 2:57 PM    [facility]

## 2018-11-23 NOTE — Progress Notes (Signed)
Daily Session Note  Patient Details  Name: Jeremy [name] MRN: [id number] Date of Birth: Aug 17, 1955 Referring Provider:     Cardiac Rehab from 11/09/2018 in [facility] Cardiac and Pulmonary Rehab  Referring Provider  [name]      Encounter Date: 11/23/2018  Check In:      Social History   Tobacco Use  Smoking Status Never Smoker  Smokeless Tobacco Never Used    Goals Met:  Independence with exercise equipment Exercise tolerated well No report of cardiac concerns or symptoms Strength training completed today  Goals Unmet:  Not Applicable  Comments: Pt able to follow exercise prescription today without complaint.  Will continue to monitor for progression.    Dr. Emily Filbert is Medical Director for [place] and [facility].

## 2018-11-24 DIAGNOSIS — D631 Anemia in chronic kidney disease: Secondary | ICD-10-CM | POA: Diagnosis not present

## 2018-11-24 DIAGNOSIS — N2581 Secondary hyperparathyroidism of renal origin: Secondary | ICD-10-CM | POA: Diagnosis not present

## 2018-11-24 DIAGNOSIS — Z992 Dependence on renal dialysis: Secondary | ICD-10-CM | POA: Diagnosis not present

## 2018-11-24 DIAGNOSIS — N186 End stage renal disease: Secondary | ICD-10-CM | POA: Diagnosis not present

## 2018-11-25 ENCOUNTER — Encounter: Payer: Medicare Other | Admitting: *Deleted

## 2018-11-25 ENCOUNTER — Other Ambulatory Visit: Payer: Self-pay

## 2018-11-25 DIAGNOSIS — Z951 Presence of aortocoronary bypass graft: Secondary | ICD-10-CM | POA: Diagnosis not present

## 2018-11-25 DIAGNOSIS — E785 Hyperlipidemia, unspecified: Secondary | ICD-10-CM | POA: Diagnosis not present

## 2018-11-25 DIAGNOSIS — I2510 Atherosclerotic heart disease of native coronary artery without angina pectoris: Secondary | ICD-10-CM | POA: Diagnosis not present

## 2018-11-25 DIAGNOSIS — I5020 Unspecified systolic (congestive) heart failure: Secondary | ICD-10-CM | POA: Diagnosis not present

## 2018-11-25 DIAGNOSIS — I120 Hypertensive chronic kidney disease with stage 5 chronic kidney disease or end stage renal disease: Secondary | ICD-10-CM | POA: Diagnosis not present

## 2018-11-25 DIAGNOSIS — D631 Anemia in chronic kidney disease: Secondary | ICD-10-CM | POA: Diagnosis not present

## 2018-11-25 NOTE — Progress Notes (Signed)
Daily Session Note  Patient Details  Name: Jeremy [name] MRN: [id number] Date of Birth: 06/05/1955 Referring Provider:     Cardiac Rehab from 11/09/2018 in [facility] Cardiac and Pulmonary Rehab  Referring Provider  [name]      Encounter Date: 11/25/2018  Check In:      Social History   Tobacco Use  Smoking Status Never Smoker  Smokeless Tobacco Never Used    Goals Met:  Independence with exercise equipment Exercise tolerated well No report of cardiac concerns or symptoms Strength training completed today  Goals Unmet:  Not Applicable  Comments: Pt able to follow exercise prescription today without complaint.  Will continue to monitor for progression.    Dr. Emily Filbert is Medical Director for [place] and [facility].

## 2018-11-25 NOTE — Progress Notes (Signed)
Daily Session Note  Patient Details  Name: [name] MRN: [id number] Date of Birth: [birth year] Referring Provider:     Cardiac Rehab from 11/09/2018 in [facility] Cardiac and Pulmonary Rehab  Referring Provider  [name]      Encounter Date: 11/25/2018  Check In: Session Check In - 11/25/18 [id number]      Check-In   Staff Present  Heath Lark, RN, BSN, CCRP;Jeanna Durrell BS, Exercise Physiologist;Amanda Oletta Darter, [place], ACSM CEP, Exercise Physiologist    Virtual Visit  No    Medication changes reported      No    Fall or balance concerns reported     No    Warm-up and Cool-down  Performed on first and last piece of equipment    Resistance Training Performed  Yes    VAD Patient?  No    PAD/SET Patient?  No      Pain Assessment   Currently in Pain?  No/denies          Social History   Tobacco Use  Smoking Status Never Smoker  Smokeless Tobacco Never Used    Goals Met:  Independence with exercise equipment Exercise tolerated well No report of cardiac concerns or symptoms Strength training completed today  Goals Unmet:  Not Applicable  Comments: Doing well with exercise Progression   Dr. Emily Filbert is Medical Director for [place] and [facility].

## 2018-11-26 ENCOUNTER — Other Ambulatory Visit: Payer: Self-pay

## 2018-11-26 DIAGNOSIS — N186 End stage renal disease: Secondary | ICD-10-CM | POA: Diagnosis not present

## 2018-11-26 DIAGNOSIS — N2581 Secondary hyperparathyroidism of renal origin: Secondary | ICD-10-CM | POA: Diagnosis not present

## 2018-11-26 DIAGNOSIS — D631 Anemia in chronic kidney disease: Secondary | ICD-10-CM | POA: Diagnosis not present

## 2018-11-26 DIAGNOSIS — Z992 Dependence on renal dialysis: Secondary | ICD-10-CM | POA: Diagnosis not present

## 2018-11-29 ENCOUNTER — Inpatient Hospital Stay: Payer: Medicare Other | Attending: Oncology | Admitting: Oncology

## 2018-11-29 ENCOUNTER — Other Ambulatory Visit: Payer: Self-pay

## 2018-11-29 ENCOUNTER — Encounter: Payer: Self-pay | Admitting: Oncology

## 2018-11-29 ENCOUNTER — Other Ambulatory Visit: Payer: Self-pay | Admitting: *Deleted

## 2018-11-29 ENCOUNTER — Inpatient Hospital Stay: Payer: Medicare Other

## 2018-11-29 VITALS — BP 120/65 | HR 91 | Temp 98.0°F | Resp 16 | Ht 70.0 in | Wt 188.2 lb

## 2018-11-29 DIAGNOSIS — I4891 Unspecified atrial fibrillation: Secondary | ICD-10-CM

## 2018-11-29 DIAGNOSIS — D631 Anemia in chronic kidney disease: Secondary | ICD-10-CM | POA: Insufficient documentation

## 2018-11-29 DIAGNOSIS — D649 Anemia, unspecified: Secondary | ICD-10-CM

## 2018-11-29 DIAGNOSIS — N2581 Secondary hyperparathyroidism of renal origin: Secondary | ICD-10-CM | POA: Diagnosis not present

## 2018-11-29 DIAGNOSIS — Z85528 Personal history of other malignant neoplasm of kidney: Secondary | ICD-10-CM | POA: Insufficient documentation

## 2018-11-29 DIAGNOSIS — C649 Malignant neoplasm of unspecified kidney, except renal pelvis: Secondary | ICD-10-CM

## 2018-11-29 DIAGNOSIS — Z992 Dependence on renal dialysis: Secondary | ICD-10-CM | POA: Diagnosis not present

## 2018-11-29 DIAGNOSIS — N189 Chronic kidney disease, unspecified: Secondary | ICD-10-CM | POA: Diagnosis not present

## 2018-11-29 DIAGNOSIS — Z7901 Long term (current) use of anticoagulants: Secondary | ICD-10-CM | POA: Diagnosis not present

## 2018-11-29 DIAGNOSIS — N186 End stage renal disease: Secondary | ICD-10-CM | POA: Diagnosis not present

## 2018-11-29 LAB — COMPREHENSIVE METABOLIC PANEL
ALT: 18 U/L (ref 0–44)
AST: 23 U/L (ref 15–41)
Albumin: 4 g/dL (ref 3.5–5.0)
Alkaline Phosphatase: 208 U/L — ABNORMAL HIGH (ref 38–126)
Anion gap: 14 (ref 5–15)
BUN: 19 mg/dL (ref 8–23)
CO2: 33 mmol/L — ABNORMAL HIGH (ref 22–32)
Calcium: 9.1 mg/dL (ref 8.9–10.3)
Chloride: 93 mmol/L — ABNORMAL LOW (ref 98–111)
Creatinine, Ser: 5.46 mg/dL — ABNORMAL HIGH (ref 0.61–1.24)
GFR calc Af Amer: 12 mL/min — ABNORMAL LOW (ref >60)
GFR calc non Af Amer: 10 mL/min — ABNORMAL LOW (ref >60)
Glucose, Bld: 85 mg/dL (ref 70–99)
Potassium: 4 mmol/L (ref 3.5–5.1)
Sodium: 140 mmol/L (ref 135–145)
Total Bilirubin: 0.7 mg/dL (ref 0.3–1.2)
Total Protein: 8.6 g/dL — ABNORMAL HIGH (ref 6.5–8.1)

## 2018-11-29 LAB — CBC WITH DIFFERENTIAL/PLATELET
Abs Immature Granulocytes: 0.06 10*3/uL (ref 0.00–0.07)
Basophils Absolute: 0 10*3/uL (ref 0.0–0.1)
Basophils Relative: 0 %
Eosinophils Absolute: 0.1 10*3/uL (ref 0.0–0.5)
Eosinophils Relative: 2 %
HCT: 37 % — ABNORMAL LOW (ref 39.0–52.0)
Hemoglobin: 11.6 g/dL — ABNORMAL LOW (ref 13.0–17.0)
Immature Granulocytes: 1 %
Lymphocytes Relative: 23 %
Lymphs Abs: 1.2 10*3/uL (ref 0.7–4.0)
MCH: 33.3 pg (ref 26.0–34.0)
MCHC: 31.4 g/dL (ref 30.0–36.0)
MCV: 106.3 fL — ABNORMAL HIGH (ref 80.0–100.0)
Monocytes Absolute: 0.4 10*3/uL (ref 0.1–1.0)
Monocytes Relative: 7 %
Neutro Abs: 3.5 10*3/uL (ref 1.7–7.7)
Neutrophils Relative %: 67 %
Platelets: 139 10*3/uL — ABNORMAL LOW (ref 150–400)
RBC: 3.48 MIL/uL — ABNORMAL LOW (ref 4.22–5.81)
RDW: 14.8 % (ref 11.5–15.5)
WBC: 5.3 10*3/uL (ref 4.0–10.5)
nRBC: 0 % (ref 0.0–0.2)

## 2018-11-29 LAB — IRON AND TIBC
Iron: 45 ug/dL (ref 45–182)
Saturation Ratios: 18 % (ref 17.9–39.5)
TIBC: 257 ug/dL (ref 250–450)
UIBC: 212 ug/dL

## 2018-11-29 LAB — FERRITIN: Ferritin: 1017 ng/mL — ABNORMAL HIGH (ref 24–336)

## 2018-11-29 NOTE — Progress Notes (Signed)
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 He got another transplant and the kidneys gave out on him and he still has the transplant kidney inside of him. He is on dialysis and he goes to [facility] MD Dr. Smith Mince . He is looking into another transplant and he was denied by [facility]. He spoke to Dr. Smith Mince and after he had his triple bypass this past June, he states the doctor will try to see if [place] could see him about another transplant.

## 2018-11-29 NOTE — Progress Notes (Signed)
Hematology/Oncology Consult note [facility] Telephone:([phone] Fax:([phone]) [phone]  Patient Care Team: Leone Haven, MD as PCP - General (Family Medicine) Wellington Hampshire, MD as PCP - Cardiology (Cardiology) Warden Fillers, MD as Consulting Physician (Ophthalmology) Whitney Muse, Kelby Fam, MD (Inactive) as Consulting Physician (Hematology and Oncology) Algernon Huxley, MD as Referring Physician (Vascular Surgery) Robert Bellow, MD (General Surgery)   Name of the patient: Jeremy [name]  [id number]  Mar 19, 1956    Reason for referral-history of renal cell carcinoma   Referring physician-Dr. Caryl Bis  Date of visit: 11/29/18   History of presenting illness-patient is a 63 year old male with a complex past medical history.  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  He was then on dialysis for a few years and went on to receive a second renal transplant.  His second kidney transplant got rejected as well and he went back on hemodialysis which he is on presently.  He has been following up with Dr. Turner Daniels from medical oncology at [facility] on the last year for his renal cell carcinoma and underwent CT chest abdomen and pelvis without contrast in November 2019 which did not show any evidence of metastatic disease and he remains on clinical surveillance.    He also has other comorbidities including aortic valve replacement for which he is on Coumadin, atrial fibrillation and also underwent three-vessel CABG recently.  Patient receives EPO and IV iron through nephrology during dialysis.  Currently he reports mild fatigue but denies other complaints  ECOG PS- 1   Pain scale- 0   Review of systems- Review of Systems  Constitutional: Positive for malaise/fatigue. Negative for chills, fever and weight loss.  HENT: Negative for congestion, ear discharge and nosebleeds.   Eyes: Negative for blurred vision.  Respiratory: Negative for cough, hemoptysis, sputum production, shortness of breath and wheezing.   Cardiovascular: Negative for chest pain, palpitations, orthopnea and claudication.  Gastrointestinal: Negative for abdominal pain, blood in stool, constipation, diarrhea, heartburn, melena, nausea and vomiting.  Genitourinary: Negative for dysuria, flank pain, frequency, hematuria and urgency.  Musculoskeletal: Negative for back pain, joint pain and myalgias.  Skin: Negative for rash.  Neurological: Negative for dizziness, tingling, focal weakness, seizures, weakness and headaches.  Endo/Heme/Allergies: Does not bruise/bleed easily.  Psychiatric/Behavioral: Negative for depression and suicidal ideas. The patient does not have insomnia.     Allergies  Allergen Reactions  . Statins     Other reaction(s): Arthralgias (intolerance), Myalgias (intolerance)  Pt taking pravastatin   . Sertraline Rash    Patient Active Problem List   Diagnosis Date Noted  . Skin lesion 11/17/2018  . Complication of vascular access for dialysis 10/01/2018  . S/P CABG x 3 09/16/2018  . Coronary artery disease of native artery of native heart with stable angina pectoris ([place]) 09/13/2018  . Encounter for therapeutic drug monitoring 09/02/2018  . Hydrocele 08/16/2018  . Penile pain 08/14/2018  . Chronic systolic (congestive) heart failure ([place])   . IBS (irritable bowel syndrome) 03/19/2018  . Abnormal stress test 03/19/2018  . URI (upper respiratory infection) 03/19/2018  . Muscle strain 03/19/2018  . Cut of lower leg 11/17/2017  . Depression 05/17/2017  . AV fistula ([place]) 05/17/2017  .  Chronic pain of both knees 05/17/2017  . Arthralgia 05/17/2017  . Rash  05/17/2017  . Osteoarthritis 03/17/2017  . Trigger little finger of right hand 03/17/2017  . 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 Atrial fibrillation ([place]) [I48.91] 10/30/2016  . Ventral hernia 10/14/2016  . History of renal cell carcinoma 08/04/2016  . Renal transplant failure and rejection 07/31/2016  . AKI (acute kidney injury) ([place]) 10/20/2015  . Volume overload 10/20/2015  . Microhematuria 10/01/2015  . Transplant recipient 10/01/2015  . Hypogonadism in male 07/25/2015  . 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 Gout 04/24/2015  . Visit for biopsy 01/03/2015  . Proteinuria 01/03/2015  . Chronic anticoagulation 11/06/2014  . Iron deficiency anemia 11/02/2014  . Rectal bleeding 11/02/2014  . Malignant neoplasm of unspecified kidney, except renal pelvis ([place]) 05/09/2014  . Epigastric hernia 02/08/2014  . Incisional hernia, without obstruction or gangrene 01/30/2014  . 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[place]) 08/16/2013  . Vitamin D deficiency 08/16/2013  . Cervical radicular pain 07/07/2013  . Neck pain 07/07/2013  . Spinal stenosis 06/20/2013  . OSA (obstructive sleep apnea)   . Edema 02/22/2013  . Immunosuppression ([place]) 02/22/2013  . Hernia of abdominal wall 01/17/2013  . Hypomagnesemia 01/03/2013  . Tremor 12/30/2012  . History of kidney transplant 12/15/2012  . Immunosuppressive management encounter following kidney transplant 12/15/2012  . Kidney transplant status 12/15/2012  . S/P angioplasty with stent 09/07/2012  . Aortic valve replaced 09/07/2012  . Lumbar radiculopathy 09/06/2012  . ESRD on dialysis ([place]) 07/26/2012  . Hyperlipidemia 07/26/2012  . S/P AVR (aortic valve replacement) 06/23/2012  . Aortic valve disorder 06/14/2012  . Heart failure ([place]) 05/30/2012  . Thyroid nodule 06/26/2011  . Status post percutaneous transluminal coronary angioplasty 05/27/2011  . CAD (coronary artery disease) 05/26/2011  . Essential hypertension  05/19/2011     Past Medical History:  Diagnosis Date  . 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 Aortic stenosis    a. 2014 s/p mech AVR ([facility])-->chronic coumadin; b. 08/2018 Echo: Mild AS/AI. Nl fxn/gradient.  . Arthritis   . Atrial fibrillation ([place])   . CAD (coronary artery disease)    a. 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 02/2018 MV [facility]): large inf, infsept, inflat infarct w/ min rev, EF 30%; c. 06/2018 Cath: LM min irregs, LAD 60p, 95/30/67m, LCX min irregs, RCA 40p, 67m, 90d, RPAV 90, RPDA 95ost; c. 09/2018 CABGx3 (LIMA->LAD, VG->D1, VG->PDA).  . Chronic anticoagulation 06/2012   a. Coumadin in the setting of mech AVR and afib.  Marland Kitchen CPAP (continuous positive airway pressure) dependence   . Dialysis patient [facility]) 04/07/2006   Patient started HD around 1996,  had 1st transplant LLQ around 1998 until 2007 when they found renal cell cancer in transplant and both native kidneys, all were removed > went back on HD until 2nd transplant RLQ in 2014 which lasted 3 yrs; it was embolized for suspected acute rejection. Is currently on HD MWF in [place] w/ [facility] nephrology.  . ESRD (end stage renal disease) ([place])    a. CKD 2/2 to IgA nephropathy (dx age 23); b. 1999 s/p Kidney transplant; c. 2014 Bilat nephrectomy (transplanted kidney resected) in the setting of renal cell carcinoma; d. PD until 11/2017-->HD.  Marland Kitchen Heart murmur   . HFrEF (heart failure with reduced ejection fraction) ([place])    a. 07/2016 Echo: EF 55-60%; b. 02/2018 MV: EF 30%; c.  04/2018 Echo: EF 35%; d. 08/2018 Echo: EF 30-35%. Glob HK w/o rwma. Mildly reduced RV fxn. Mod to sev dil LA. Nl AoV fxn.  Marland Kitchen Hx of colonoscopy    2009 by Dr. Laural [place]. Normal except for small submucosal lipoma. No evidence of polyps or colitis.  . Hyperlipidemia   . Hypertension   . Hypogonadism in male 07/25/2015  . Ischemic cardiomyopathy    a. 07/2016 Echo: EF 55-60%; b. 02/2018 MV: EF 30%;  c. 04/2018 Echo: EF 35%; d. 08/2018 Echo: EF 30-35%.  . OSA (obstructive sleep apnea)    No CPAP  . Permanent atrial fibrillation    a. CHA2DS2VASc = 3-->Coumadin (also mech AVR); b. 06/2018 Amio started 2/2 elevated rates.  . Renal cell cancer ([place])   . Renal cell carcinoma ([place]) 08/04/2016     Past Surgical History:  Procedure Laterality Date  . AORTIC VALVE REPLACEMENT  3 /11 /2014   At [place]    . CAPD INSERTION N/A 02/19/2017   Procedure: LAPAROSCOPIC INSERTION CONTINUOUS AMBULATORY PERITONEAL DIALYSIS  (CAPD) CATHETER;  Surgeon: Algernon Huxley, MD;  Location: [facility] ORS;  Service: General;  Laterality: N/A;  . CORONARY ANGIOPLASTY WITH STENT PLACEMENT    . CORONARY ARTERY BYPASS GRAFT N/A 09/16/2018   Procedure: CORONARY ARTERY BYPASS GRAFT times three      with left internal mammary artery and endoharvest of right leg greater saphenous vein;  Surgeon: Melrose Nakayama, MD;  Location: [place];  Service: Open Heart Surgery;  Laterality: N/A;  . DG AV DIALYSIS  SHUNT ACCESS EXIST*L* OR     left arm  . EPIGASTRIC HERNIA REPAIR N/A 02/19/2017   Procedure: HERNIA REPAIR EPIGASTRIC ADULT;  Surgeon: Robert Bellow, MD;  Location: [facility] ORS;  Service: General;  Laterality: N/A;  . EYE SURGERY     bilateral cataract  . HERNIA REPAIR     UMBILICAL HERNIA  . HERNIA REPAIR  02/19/2017  . INNER EAR SURGERY     20 years ago  . KIDNEY TRANSPLANT    . Peritoneal Dialysis access placement  02/19/2017  . REMOVAL OF A DIALYSIS CATHETER N/A 11/23/2017   Procedure: REMOVAL OF A PERITONEAL DIALYSIS CATHETER;  Surgeon: Algernon Huxley, MD;  Location: [facility] OR[facility];  Service: Vascular;  Laterality: N/A;  . RIGHT/LEFT HEART CATH AND CORONARY ANGIOGRAPHY Bilateral 06/07/2018   Procedure: RIGHT/LEFT HEART CATH AND CORONARY ANGIOGRAPHY;  Surgeon: Wellington Hampshire, MD;  Location: [place] CV LAB;  Service: Cardiovascular;  Laterality: Bilateral;  . TEE WITHOUT  CARDIOVERSION N/A 07/21/2016   Procedure: TRANSESOPHAGEAL ECHOCARDIOGRAM (TEE);  Surgeon: Wellington Hampshire, MD;  Location: [facility];  Service: Cardiovascular;  Laterality: N/A;  . TEE WITHOUT CARDIOVERSION N/A 09/16/2018   Procedure: TRANSESOPHAGEAL ECHOCARDIOGRAM (TEE);  Surgeon: Melrose Nakayama, MD;  Location: [place];  Service: Open Heart Surgery;  Laterality: N/A;    Social History   Socioeconomic History  . Marital status: Significant Other    Spouse name: Not on file  . Number of children: Not on file  . Years of education: Not on file  . Highest education level: High school graduate  Occupational History  . Occupation: courier    Comment: [employer]  . Financial resource strain: Not hard at all  . Food insecurity    Worry: Never true    Inability: Never true  . Transportation needs    Medical: No    Non-medical: No  Tobacco Use  . Smoking status: Never Smoker  . Smokeless tobacco: Never Used  Substance and Sexual Activity  . Alcohol use: No  . Drug use: No  . Sexual activity: Not Currently    Partners: Female  Lifestyle  . Physical activity    Days per week: 0 days    Minutes per session: Not on file  . Stress: Not on file  Relationships  . Social connections    Talks on phone: More than three times a week    Gets together: Once a week    Attends religious service: 1 to 4 times per year    Active member of club or organization: Not on file    Attends meetings of clubs or organizations: Not on file    Relationship status: Living with partner  . Intimate partner violence    Fear of current or ex partner: No    Emotionally abused: No    Physically abused: No    Forced sexual activity: No  Other Topics Concern  . Not on file  Social History Narrative   Single   1 daughter 59      Family History  Problem Relation Age of Onset  . Cancer Mother        pancreatic  . Heart disease Father   . Diabetes Father      Current Outpatient  Medications:  .  acetaminophen (TYLENOL) 500 MG tablet, Take 1,000 mg 2 (two) times daily as needed by mouth for moderate pain., Disp: , Rfl:  .  allopurinol (ZYLOPRIM) 300 MG tablet, Take 300 mg by mouth daily., Disp: , Rfl: 5 .  amiodarone (PACERONE) 200 MG tablet, Take 1 tablet (200 mg total) by mouth daily., Disp: 180 tablet, Rfl: 3 .  aspirin EC 81 MG EC tablet, Take 1 tablet (81 mg total) by mouth daily., Disp:  , Rfl:  .  cinacalcet (SENSIPAR) 30 MG tablet, Take 30 mg by mouth every other day. , Disp: , Rfl:  .  Darbepoetin Alfa (ARANESP) 100 MCG/0.5ML SOSY injection, Inject 0.5 mLs (100 mcg total) into the vein every Monday with hemodialysis., Disp: 4.2 mL, Rfl:  .  Dextrose-Fructose-Sod Citrate (NAUZENE PO), Take 1-2 tablets 2 (two) times daily as needed by mouth (nausea)., Disp: , Rfl:  .  folic acid-vitamin b complex-vitamin c-selenium-zinc (DIALYVITE) 3 MG TABS tablet, Take 1 tablet by mouth daily., Disp: , Rfl:  .  lidocaine-prilocaine (EMLA) cream, Apply 1 application topically as needed (for fistula)., Disp: , Rfl:  .  metoprolol tartrate (LOPRESSOR) 25 MG tablet, TAKE 1/2 TABLETS (12.5 MG TOTAL) BY MOUTH 2 (TWO) TIMES DAILY., Disp: 30 tablet, Rfl: 1 .  omeprazole (PRILOSEC) 20 MG capsule, Take 20 mg by mouth daily., Disp: , Rfl:  .  predniSONE (DELTASONE) 5 MG tablet, Take 5 mg by mouth daily with breakfast. , Disp: , Rfl:  .  rosuvastatin (CRESTOR) 10 MG tablet, TAKE 1 TABLET (10 MG TOTAL) BY MOUTH DAILY AT 6 PM., Disp: 30 tablet, Rfl: 1 .  sevelamer carbonate (RENVELA) 800 MG tablet, Take 40,000 mg by mouth 3 (three) times daily with meals. 5 tabs per meal, Disp: , Rfl:  .  warfarin (COUMADIN) 1 MG tablet, Take 1 tablet (1 mg total) by mouth daily at 6 PM. (Patient taking differently: Take 1-2 mg by mouth See admin instructions. 1 mg all days , except 2 mg on Sunday..* OR AS DIRECTED), Disp: 90 tablet, Rfl: 1 .  AURYXIA 1 GM 210 MG(Fe)  tablet, TAKE 2 TABLETS BY MOUTH THREE TIMES A  DAY WITH MEALS, Disp: , Rfl:    Physical exam:  Vitals:   11/29/18 1517  BP: 120/65  Pulse: 91  Resp: 16  Temp: 98 F (36.7 C)  TempSrc: Tympanic  Weight: 188 lb 3.2 oz (85.4 kg)  Height: 5\' 10"  (1.778 m)   Physical Exam HENT:     Head: Normocephalic and atraumatic.  Eyes:     Pupils: Pupils are equal, round, and reactive to light.  Neck:     Musculoskeletal: Normal range of motion.  Cardiovascular:     Rate and Rhythm: Normal rate. Rhythm irregular.     Heart sounds: Normal heart sounds.     Comments: Central scar of CABG seen Pulmonary:     Effort: Pulmonary effort is normal.     Breath sounds: Normal breath sounds.  Abdominal:     General: Bowel sounds are normal.     Palpations: Abdomen is soft.     Comments: Scars of prior peritoneal catheter placement and prior abdominal surgeries seen  Skin:    General: Skin is warm and dry.  Neurological:     Mental Status: He is alert and oriented to person, place, and time.        CMP Latest Ref Rng & Units 09/22/2018  Glucose 70 - 99 mg/dL 128(H)  BUN 8 - 23 mg/dL 31(H)  Creatinine 0.61 - 1.24 mg/dL 7.61(H)  Sodium 135 - 145 mmol/L 138  Potassium 3.5 - 5.1 mmol/L 4.0  Chloride 98 - 111 mmol/L 100  CO2 22 - 32 mmol/L 24  Calcium 8.9 - 10.3 mg/dL 8.2(L)  Total Protein 6.5 - 8.1 g/dL -  Total Bilirubin 0.3 - 1.2 mg/dL -  Alkaline Phos 38 - 126 U/L -  AST 15 - 41 U/L -  ALT 0 - 44 U/L -   CBC Latest Ref Rng & Units 09/22/2018  WBC 4.0 - 10.5 K/uL 6.8  Hemoglobin 13.0 - 17.0 g/dL 8.4(L)  Hematocrit 39.0 - 52.0 % 26.1(L)  Platelets 150 - 400 K/uL 121(L)    No images are attached to the encounter.  Dg Chest 2 View  Result Date: 11/10/2018 CLINICAL DATA:  Recent CABG.  Sternal pain. EXAM: CHEST - 2 VIEW COMPARISON:  10/19/2018 FINDINGS: Redemonstrated postoperative changes from CABG. Median sternotomy wires are intact and unchanged in alignment. Prosthetic aortic valve. Stable mild cardiomegaly. No focal airspace  consolidation. No pleural effusion. No pneumothorax. No acute osseous abnormality. IMPRESSION: No active cardiopulmonary disease. Electronically Signed   By: Davina Poke M.D.   On: 11/10/2018 15:25    Assessment and plan- Patient is a 63 y.o. male with history of renal cell carcinoma transferring his care from [facility] to reestablish care here  Renal cell carcinoma: This was back in 2008 and currently he remains in remission under clinical surveillance.  His last CT chest abdomen pelvis without contrast was in November 2019 which did not show any evidence of recurrence.  I will repeat another CT chest and abdomen and pelvis without contrast in November 2020 and see him thereafter  Normocytic anemia: Patient's last hemoglobin 2 months ago was 8.4 likely secondary to recent CABG surgery at that time.  I will get a repeat CBC with differential, CMP and iron studies at this time.  He does have anemia of chronic kidney disease and gets EPO and iron through nephrology during his hemodialysis   Thank you for this kind referral and the opportunity  to participate in the care of this patient   Visit Diagnosis 1. History of renal cell carcinoma     Dr. Randa Evens, MD, MPH [facility] at [facility] [phone] 11/29/2018  8:19 AM

## 2018-11-30 ENCOUNTER — Ambulatory Visit: Payer: Medicare Other | Admitting: Internal Medicine

## 2018-11-30 ENCOUNTER — Encounter: Payer: Medicare Other | Admitting: *Deleted

## 2018-11-30 DIAGNOSIS — Z951 Presence of aortocoronary bypass graft: Secondary | ICD-10-CM | POA: Diagnosis not present

## 2018-11-30 DIAGNOSIS — D631 Anemia in chronic kidney disease: Secondary | ICD-10-CM | POA: Diagnosis not present

## 2018-11-30 DIAGNOSIS — E785 Hyperlipidemia, unspecified: Secondary | ICD-10-CM | POA: Diagnosis not present

## 2018-11-30 DIAGNOSIS — I5020 Unspecified systolic (congestive) heart failure: Secondary | ICD-10-CM | POA: Diagnosis not present

## 2018-11-30 DIAGNOSIS — I120 Hypertensive chronic kidney disease with stage 5 chronic kidney disease or end stage renal disease: Secondary | ICD-10-CM | POA: Diagnosis not present

## 2018-11-30 DIAGNOSIS — I2510 Atherosclerotic heart disease of native coronary artery without angina pectoris: Secondary | ICD-10-CM | POA: Diagnosis not present

## 2018-11-30 NOTE — Progress Notes (Signed)
Daily Session Note  Patient Details  Name: Jeremy [name] MRN: [id number] Date of Birth: 1955-06-22 Referring Provider:     Cardiac Rehab from 11/09/2018 in [facility] Cardiac and Pulmonary Rehab  Referring Provider  [name]      Encounter Date: 11/30/2018  Check In: Session Check In - 11/30/18 0802      Check-In   Supervising physician immediately available to respond to emergencies  See telemetry face sheet for immediately available ER MD    Location  [facility]-Cardiac & Pulmonary Rehab    Staff Present  Heath Lark, RN, BSN, CCRP;Jeanna Durrell BS, Exercise Physiologist    Virtual Visit  No    Medication changes reported      No    Fall or balance concerns reported     No    Warm-up and Cool-down  Performed on first and last piece of equipment    Resistance Training Performed  Yes    VAD Patient?  No    PAD/SET Patient?  No      Pain Assessment   Currently in Pain?  No/denies          Social History   Tobacco Use  Smoking Status Never Smoker  Smokeless Tobacco Never Used    Goals Met:  Independence with exercise equipment Exercise tolerated well No report of cardiac concerns or symptoms Strength training completed today  Goals Unmet:  Not Applicable  Comments: Pt able to follow exercise prescription today without complaint.  Will continue to monitor for progression.    Dr. Emily Filbert is Medical Director for [place] and [facility].

## 2018-12-01 DIAGNOSIS — D631 Anemia in chronic kidney disease: Secondary | ICD-10-CM | POA: Diagnosis not present

## 2018-12-01 DIAGNOSIS — N186 End stage renal disease: Secondary | ICD-10-CM | POA: Diagnosis not present

## 2018-12-01 DIAGNOSIS — N2581 Secondary hyperparathyroidism of renal origin: Secondary | ICD-10-CM | POA: Diagnosis not present

## 2018-12-01 DIAGNOSIS — Z992 Dependence on renal dialysis: Secondary | ICD-10-CM | POA: Diagnosis not present

## 2018-12-02 ENCOUNTER — Other Ambulatory Visit: Payer: Self-pay

## 2018-12-02 DIAGNOSIS — H93299 Other abnormal auditory perceptions, unspecified ear: Secondary | ICD-10-CM | POA: Diagnosis not present

## 2018-12-02 DIAGNOSIS — I120 Hypertensive chronic kidney disease with stage 5 chronic kidney disease or end stage renal disease: Secondary | ICD-10-CM | POA: Diagnosis not present

## 2018-12-02 DIAGNOSIS — Z951 Presence of aortocoronary bypass graft: Secondary | ICD-10-CM

## 2018-12-02 DIAGNOSIS — D631 Anemia in chronic kidney disease: Secondary | ICD-10-CM | POA: Diagnosis not present

## 2018-12-02 DIAGNOSIS — E785 Hyperlipidemia, unspecified: Secondary | ICD-10-CM | POA: Diagnosis not present

## 2018-12-02 DIAGNOSIS — E041 Nontoxic single thyroid nodule: Secondary | ICD-10-CM | POA: Diagnosis not present

## 2018-12-02 DIAGNOSIS — H6121 Impacted cerumen, right ear: Secondary | ICD-10-CM | POA: Diagnosis not present

## 2018-12-02 DIAGNOSIS — I2510 Atherosclerotic heart disease of native coronary artery without angina pectoris: Secondary | ICD-10-CM | POA: Diagnosis not present

## 2018-12-02 DIAGNOSIS — I5020 Unspecified systolic (congestive) heart failure: Secondary | ICD-10-CM | POA: Diagnosis not present

## 2018-12-02 NOTE — Progress Notes (Signed)
Daily Session Note  Patient Details  Name: Jeremy [name] MRN: [id number] Date of Birth: March 13, 1956 Referring Provider:     Cardiac Rehab from 11/09/2018 in [facility] Cardiac and Pulmonary Rehab  Referring Provider  [name]      Encounter Date: 12/02/2018  Check In: Session Check In - 12/02/18 0801      Check-In   Supervising physician immediately available to respond to emergencies  See telemetry face sheet for immediately available ER MD    Location  [facility]-Cardiac & Pulmonary Rehab    Staff Present  Vida Rigger RN, Vickki Hearing, BA, ACSM CEP, Exercise Physiologist;Jessica Luan Pulling, MA, RCEP, CCRP, CCET    Virtual Visit  No    Medication changes reported      No    Fall or balance concerns reported     No    Warm-up and Cool-down  Performed on first and last piece of equipment    Resistance Training Performed  Yes    VAD Patient?  No    PAD/SET Patient?  No      Pain Assessment   Currently in Pain?  No/denies    Multiple Pain Sites  No          Social History   Tobacco Use  Smoking Status Never Smoker  Smokeless Tobacco Never Used    Goals Met:  Independence with exercise equipment Exercise tolerated well No report of cardiac concerns or symptoms Strength training completed today  Goals Unmet:  Not Applicable  Comments: Pt able to follow exercise prescription today without complaint.  Will continue to monitor for progression.   Dr. Emily Filbert is Medical Director for [place] and [facility].

## 2018-12-03 DIAGNOSIS — N186 End stage renal disease: Secondary | ICD-10-CM | POA: Diagnosis not present

## 2018-12-03 DIAGNOSIS — D631 Anemia in chronic kidney disease: Secondary | ICD-10-CM | POA: Diagnosis not present

## 2018-12-03 DIAGNOSIS — Z992 Dependence on renal dialysis: Secondary | ICD-10-CM | POA: Diagnosis not present

## 2018-12-03 DIAGNOSIS — N2581 Secondary hyperparathyroidism of renal origin: Secondary | ICD-10-CM | POA: Diagnosis not present

## 2018-12-05 ENCOUNTER — Other Ambulatory Visit: Payer: Self-pay | Admitting: Cardiothoracic Surgery

## 2018-12-06 ENCOUNTER — Telehealth: Payer: Self-pay | Admitting: Cardiovascular Disease

## 2018-12-06 ENCOUNTER — Ambulatory Visit (INDEPENDENT_AMBULATORY_CARE_PROVIDER_SITE_OTHER): Payer: Medicare Other

## 2018-12-06 DIAGNOSIS — N2581 Secondary hyperparathyroidism of renal origin: Secondary | ICD-10-CM | POA: Diagnosis not present

## 2018-12-06 DIAGNOSIS — D631 Anemia in chronic kidney disease: Secondary | ICD-10-CM | POA: Diagnosis not present

## 2018-12-06 DIAGNOSIS — Z5181 Encounter for therapeutic drug level monitoring: Secondary | ICD-10-CM

## 2018-12-06 DIAGNOSIS — N186 End stage renal disease: Secondary | ICD-10-CM | POA: Diagnosis not present

## 2018-12-06 DIAGNOSIS — Z992 Dependence on renal dialysis: Secondary | ICD-10-CM | POA: Diagnosis not present

## 2018-12-06 DIAGNOSIS — T8610 Unspecified complication of kidney transplant: Secondary | ICD-10-CM | POA: Diagnosis not present

## 2018-12-06 DIAGNOSIS — I4821 Permanent atrial fibrillation: Secondary | ICD-10-CM

## 2018-12-06 DIAGNOSIS — Z952 Presence of prosthetic heart valve: Secondary | ICD-10-CM

## 2018-12-06 LAB — POCT INR: INR: 2.6 (ref 2.0–3.0)

## 2018-12-06 NOTE — Telephone Encounter (Addendum)
Please see anti-coag note for today (cannot create visit for last Monday). Paperwork for mdINR was on my desk this am - placed on Dr. Tyrell Antonio desk for his signature. Advised pt that this will faxed back as soon as Dr. [name] signs it.  He is Patent attorney.

## 2018-12-06 NOTE — Patient Instructions (Signed)
Please continue dosage of 1 mg EVERY DAY EXCEPT 1/2 TABLET ON MONDAYS, [place]. Recheck INR in 1 week - call [phone] w/ results.

## 2018-12-06 NOTE — Telephone Encounter (Signed)
Today INR result per patient  2.6  Last Monday 2.9  Please call to also discuss getting form completed so the monitoring company can contact directly.  Patient says this was faxed but not returned.

## 2018-12-07 ENCOUNTER — Other Ambulatory Visit: Payer: Self-pay

## 2018-12-07 ENCOUNTER — Encounter: Payer: Medicare Other | Attending: Cardiovascular Disease

## 2018-12-07 DIAGNOSIS — Z7982 Long term (current) use of aspirin: Secondary | ICD-10-CM | POA: Diagnosis not present

## 2018-12-07 DIAGNOSIS — I2510 Atherosclerotic heart disease of native coronary artery without angina pectoris: Secondary | ICD-10-CM | POA: Diagnosis not present

## 2018-12-07 DIAGNOSIS — E785 Hyperlipidemia, unspecified: Secondary | ICD-10-CM | POA: Diagnosis not present

## 2018-12-07 DIAGNOSIS — Z7901 Long term (current) use of anticoagulants: Secondary | ICD-10-CM | POA: Diagnosis not present

## 2018-12-07 DIAGNOSIS — N186 End stage renal disease: Secondary | ICD-10-CM | POA: Insufficient documentation

## 2018-12-07 DIAGNOSIS — Z951 Presence of aortocoronary bypass graft: Secondary | ICD-10-CM | POA: Insufficient documentation

## 2018-12-07 DIAGNOSIS — I5020 Unspecified systolic (congestive) heart failure: Secondary | ICD-10-CM | POA: Diagnosis not present

## 2018-12-07 DIAGNOSIS — I255 Ischemic cardiomyopathy: Secondary | ICD-10-CM | POA: Diagnosis not present

## 2018-12-07 DIAGNOSIS — D631 Anemia in chronic kidney disease: Secondary | ICD-10-CM | POA: Insufficient documentation

## 2018-12-07 DIAGNOSIS — I120 Hypertensive chronic kidney disease with stage 5 chronic kidney disease or end stage renal disease: Secondary | ICD-10-CM | POA: Diagnosis not present

## 2018-12-07 DIAGNOSIS — M1990 Unspecified osteoarthritis, unspecified site: Secondary | ICD-10-CM | POA: Insufficient documentation

## 2018-12-07 DIAGNOSIS — Z79899 Other long term (current) drug therapy: Secondary | ICD-10-CM | POA: Insufficient documentation

## 2018-12-07 NOTE — Progress Notes (Signed)
Daily Session Note  Patient Details  Name: Jeremy [name] MRN: [id number] Date of Birth: Sep 06, 1955 Referring Provider:     Cardiac Rehab from 11/09/2018 in [facility] Cardiac and Pulmonary Rehab  Referring Provider  [name]      Encounter Date: 12/07/2018  Check In:      Social History   Tobacco Use  Smoking Status Never Smoker  Smokeless Tobacco Never Used    Goals Met:  Independence with exercise equipment Exercise tolerated well No report of cardiac concerns or symptoms Strength training completed today  Goals Unmet:  Not Applicable  Comments: Pt able to follow exercise prescription today without complaint.  Will continue to monitor for progression.    Dr. Emily Filbert is Medical Director for [place] and [facility].

## 2018-12-08 ENCOUNTER — Other Ambulatory Visit: Payer: Self-pay | Admitting: Cardiothoracic Surgery

## 2018-12-08 DIAGNOSIS — L905 Scar conditions and fibrosis of skin: Secondary | ICD-10-CM | POA: Diagnosis not present

## 2018-12-08 DIAGNOSIS — L821 Other seborrheic keratosis: Secondary | ICD-10-CM | POA: Diagnosis not present

## 2018-12-08 DIAGNOSIS — N186 End stage renal disease: Secondary | ICD-10-CM | POA: Diagnosis not present

## 2018-12-08 DIAGNOSIS — D692 Other nonthrombocytopenic purpura: Secondary | ICD-10-CM | POA: Diagnosis not present

## 2018-12-08 DIAGNOSIS — L820 Inflamed seborrheic keratosis: Secondary | ICD-10-CM | POA: Diagnosis not present

## 2018-12-08 DIAGNOSIS — Z940 Kidney transplant status: Secondary | ICD-10-CM | POA: Diagnosis not present

## 2018-12-08 DIAGNOSIS — D631 Anemia in chronic kidney disease: Secondary | ICD-10-CM | POA: Diagnosis not present

## 2018-12-08 DIAGNOSIS — L814 Other melanin hyperpigmentation: Secondary | ICD-10-CM | POA: Diagnosis not present

## 2018-12-08 DIAGNOSIS — D2230 Melanocytic nevi of unspecified part of face: Secondary | ICD-10-CM | POA: Diagnosis not present

## 2018-12-08 DIAGNOSIS — Z23 Encounter for immunization: Secondary | ICD-10-CM | POA: Diagnosis not present

## 2018-12-08 DIAGNOSIS — Z992 Dependence on renal dialysis: Secondary | ICD-10-CM | POA: Diagnosis not present

## 2018-12-08 DIAGNOSIS — N2581 Secondary hyperparathyroidism of renal origin: Secondary | ICD-10-CM | POA: Diagnosis not present

## 2018-12-08 MED ORDER — PRAVASTATIN SODIUM 20 MG PO TABS: 20.0000 mg | ORAL_TABLET | Freq: Every evening | ORAL | 3 refills | Status: DC

## 2018-12-08 NOTE — Telephone Encounter (Signed)
Patient has not read MyChart message sent back to him on 12/03/18. Called patient and he verbalized understanding of Dr Tyrell Antonio recommendations. Patient is agreeable to stop rosuvastatin and start pravastatin 20 mg daily. Rx sent to verified pharmacy.

## 2018-12-08 NOTE — Addendum Note (Signed)
Addended by: Vanessa Ralphs on: 12/08/2018 02:07 PM   Modules accepted: Orders

## 2018-12-09 ENCOUNTER — Other Ambulatory Visit: Payer: Self-pay

## 2018-12-09 ENCOUNTER — Encounter: Payer: Medicare Other | Admitting: *Deleted

## 2018-12-09 DIAGNOSIS — I2510 Atherosclerotic heart disease of native coronary artery without angina pectoris: Secondary | ICD-10-CM | POA: Diagnosis not present

## 2018-12-09 DIAGNOSIS — E785 Hyperlipidemia, unspecified: Secondary | ICD-10-CM | POA: Diagnosis not present

## 2018-12-09 DIAGNOSIS — I120 Hypertensive chronic kidney disease with stage 5 chronic kidney disease or end stage renal disease: Secondary | ICD-10-CM | POA: Diagnosis not present

## 2018-12-09 DIAGNOSIS — Z951 Presence of aortocoronary bypass graft: Secondary | ICD-10-CM

## 2018-12-09 DIAGNOSIS — I5020 Unspecified systolic (congestive) heart failure: Secondary | ICD-10-CM | POA: Diagnosis not present

## 2018-12-09 DIAGNOSIS — D631 Anemia in chronic kidney disease: Secondary | ICD-10-CM | POA: Diagnosis not present

## 2018-12-09 NOTE — Progress Notes (Signed)
Daily Session Note  Patient Details  Name: Jeremy [name] MRN: [id number] Date of Birth: 1955-07-30 Referring Provider:     Cardiac Rehab from 11/09/2018 in [facility] Cardiac and Pulmonary Rehab  Referring Provider  [name]      Encounter Date: 12/09/2018  Check In: Session Check In - 12/09/18 0800      Check-In   Location  [facility]-Cardiac & Pulmonary Rehab    Staff Present  Heath Lark, RN, BSN, CCRP;Jeanna Durrell BS, Exercise Physiologist;Amanda Oletta Darter, BA, ACSM CEP, Exercise Physiologist    Virtual Visit  No    Medication changes reported      No    Fall or balance concerns reported     No    Warm-up and Cool-down  Performed on first and last piece of equipment    Resistance Training Performed  Yes    VAD Patient?  No    PAD/SET Patient?  No      Pain Assessment   Currently in Pain?  No/denies          Social History   Tobacco Use  Smoking Status Never Smoker  Smokeless Tobacco Never Used    Goals Met:  Independence with exercise equipment Exercise tolerated well No report of cardiac concerns or symptoms Strength training completed today  Goals Unmet:  Not Applicable  Comments: Pt able to follow exercise prescription today without complaint.  Will continue to monitor for progression. [name] had lower BP today after dialysis yesterday.      Dr. Emily Filbert is Medical Director for [place] and [facility].

## 2018-12-10 ENCOUNTER — Other Ambulatory Visit: Payer: Self-pay | Admitting: Thoracic Surgery (Cardiothoracic Vascular Surgery)

## 2018-12-10 DIAGNOSIS — D631 Anemia in chronic kidney disease: Secondary | ICD-10-CM | POA: Diagnosis not present

## 2018-12-10 DIAGNOSIS — Z23 Encounter for immunization: Secondary | ICD-10-CM | POA: Diagnosis not present

## 2018-12-10 DIAGNOSIS — N186 End stage renal disease: Secondary | ICD-10-CM | POA: Diagnosis not present

## 2018-12-10 DIAGNOSIS — Z992 Dependence on renal dialysis: Secondary | ICD-10-CM | POA: Diagnosis not present

## 2018-12-10 DIAGNOSIS — N2581 Secondary hyperparathyroidism of renal origin: Secondary | ICD-10-CM | POA: Diagnosis not present

## 2018-12-13 DIAGNOSIS — N2581 Secondary hyperparathyroidism of renal origin: Secondary | ICD-10-CM | POA: Diagnosis not present

## 2018-12-13 DIAGNOSIS — N186 End stage renal disease: Secondary | ICD-10-CM | POA: Diagnosis not present

## 2018-12-13 DIAGNOSIS — Z23 Encounter for immunization: Secondary | ICD-10-CM | POA: Diagnosis not present

## 2018-12-13 DIAGNOSIS — Z992 Dependence on renal dialysis: Secondary | ICD-10-CM | POA: Diagnosis not present

## 2018-12-13 DIAGNOSIS — D631 Anemia in chronic kidney disease: Secondary | ICD-10-CM | POA: Diagnosis not present

## 2018-12-14 ENCOUNTER — Encounter: Payer: Medicare Other | Admitting: *Deleted

## 2018-12-14 ENCOUNTER — Other Ambulatory Visit: Payer: Self-pay

## 2018-12-14 DIAGNOSIS — I5020 Unspecified systolic (congestive) heart failure: Secondary | ICD-10-CM | POA: Diagnosis not present

## 2018-12-14 DIAGNOSIS — I2510 Atherosclerotic heart disease of native coronary artery without angina pectoris: Secondary | ICD-10-CM | POA: Diagnosis not present

## 2018-12-14 DIAGNOSIS — D631 Anemia in chronic kidney disease: Secondary | ICD-10-CM | POA: Diagnosis not present

## 2018-12-14 DIAGNOSIS — Z951 Presence of aortocoronary bypass graft: Secondary | ICD-10-CM | POA: Diagnosis not present

## 2018-12-14 DIAGNOSIS — I120 Hypertensive chronic kidney disease with stage 5 chronic kidney disease or end stage renal disease: Secondary | ICD-10-CM | POA: Diagnosis not present

## 2018-12-14 DIAGNOSIS — E785 Hyperlipidemia, unspecified: Secondary | ICD-10-CM | POA: Diagnosis not present

## 2018-12-14 DIAGNOSIS — I4821 Permanent atrial fibrillation: Secondary | ICD-10-CM | POA: Diagnosis not present

## 2018-12-14 LAB — LAB REPORT - SCANNED: INR: 2.1

## 2018-12-14 LAB — POCT INR: INR: 2.1 (ref 2.0–3.0)

## 2018-12-14 NOTE — Progress Notes (Signed)
Daily Session Note  Patient Details  Name: YONAEL TULLOCH MRN: [id number] Date of Birth: 01/16/1956 Referring Provider:     Cardiac Rehab from 11/09/2018 in [facility]  Referring Provider  [name]      Encounter Date: 12/14/2018  Check In: Session Check In - 12/14/18 0801      Check-In   Supervising physician immediately available to respond to emergencies  See telemetry face sheet for immediately available ER MD    Location  [facility]    Staff Present  Heath Lark, RN, BSN, CCRP;Joseph Hood RCP,RRT,BSRT    Virtual Visit  No    Medication changes reported      No    Fall or balance concerns reported     No    Warm-up and Cool-down  Performed on first and last piece of equipment    Resistance Training Performed  Yes    VAD Patient?  No    PAD/SET Patient?  No      Pain Assessment   Currently in Pain?  No/denies          Social History   Tobacco Use  Smoking Status Never Smoker  Smokeless Tobacco Never Used    Goals Met:  Independence with exercise equipment Exercise tolerated well No report of cardiac concerns or symptoms Strength training completed today  Goals Unmet:  Not Applicable  Comments: Pt able to follow exercise prescription today without complaint.  Will continue to monitor for progression.    Dr. Emily Filbert is Medical Director for [facility].

## 2018-12-15 ENCOUNTER — Other Ambulatory Visit
Admission: RE | Admit: 2018-12-15 | Discharge: 2018-12-15 | Disposition: A | Discharge status: Home / Self Care | Payer: Medicare Other | Source: Ambulatory Visit | Attending: Nephrology | Admitting: Nephrology

## 2018-12-15 ENCOUNTER — Encounter: Payer: Self-pay | Admitting: *Deleted

## 2018-12-15 DIAGNOSIS — N2581 Secondary hyperparathyroidism of renal origin: Secondary | ICD-10-CM | POA: Diagnosis not present

## 2018-12-15 DIAGNOSIS — E875 Hyperkalemia: Secondary | ICD-10-CM | POA: Insufficient documentation

## 2018-12-15 DIAGNOSIS — D631 Anemia in chronic kidney disease: Secondary | ICD-10-CM | POA: Diagnosis not present

## 2018-12-15 DIAGNOSIS — Z951 Presence of aortocoronary bypass graft: Secondary | ICD-10-CM

## 2018-12-15 DIAGNOSIS — Z23 Encounter for immunization: Secondary | ICD-10-CM | POA: Diagnosis not present

## 2018-12-15 DIAGNOSIS — Z992 Dependence on renal dialysis: Secondary | ICD-10-CM | POA: Diagnosis not present

## 2018-12-15 DIAGNOSIS — N186 End stage renal disease: Secondary | ICD-10-CM | POA: Diagnosis not present

## 2018-12-15 LAB — POTASSIUM: Potassium: 5.7 mmol/L — ABNORMAL HIGH (ref 3.5–5.1)

## 2018-12-15 NOTE — Telephone Encounter (Signed)
Completed [facility]INR enrollment form faxed to [facility] @ [phone].

## 2018-12-15 NOTE — Progress Notes (Signed)
Cardiac Individual Treatment Plan  Patient Details  Name: Jeremy [name] MRN: [id number] Date of Birth: Oct 12, 1955 Referring Provider:     Cardiac Rehab from 11/09/2018 in [facility] Cardiac and Pulmonary Rehab  Referring Provider  [name]      Initial Encounter Date:    Cardiac Rehab from 11/09/2018 in [facility] Cardiac and Pulmonary Rehab  Date  11/09/18      Visit Diagnosis: S/P CABG x 3  Patient's Home Medications on Admission:  Current Outpatient Medications:  .  acetaminophen (TYLENOL) 500 MG tablet, Take 1,000 mg 2 (two) times daily as needed by mouth for moderate pain., Disp: , Rfl:  .  allopurinol (ZYLOPRIM) 300 MG tablet, Take 300 mg by mouth daily., Disp: , Rfl: 5 .  amiodarone (PACERONE) 200 MG tablet, Take 1 tablet (200 mg total) by mouth daily., Disp: 180 tablet, Rfl: 3 .  aspirin EC 81 MG EC tablet, Take 1 tablet (81 mg total) by mouth daily., Disp:  , Rfl:  .  AURYXIA 1 GM 210 MG(Fe) tablet, TAKE 2 TABLETS BY MOUTH THREE TIMES A DAY WITH MEALS, Disp: , Rfl:  .  cinacalcet (SENSIPAR) 30 MG tablet, Take 30 mg by mouth every other day. , Disp: , Rfl:  .  Darbepoetin Alfa (ARANESP) 100 MCG/0.5ML SOSY injection, Inject 0.5 mLs (100 mcg total) into the vein every Monday with hemodialysis., Disp: 4.2 mL, Rfl:  .  Dextrose-Fructose-Sod Citrate (NAUZENE PO), Take 1-2 tablets 2 (two) times daily as needed by mouth (nausea)., Disp: , Rfl:  .  folic acid-vitamin b complex-vitamin c-selenium-zinc (DIALYVITE) 3 MG TABS tablet, Take 1 tablet by mouth daily., Disp: , Rfl:  .  lidocaine-prilocaine (EMLA) cream, Apply 1 application topically as needed (for fistula)., Disp: , Rfl:  .  metoprolol tartrate (LOPRESSOR) 25 MG tablet, TAKE 1/2 TABLETS (12.5 MG TOTAL) BY MOUTH 2 (TWO) TIMES DAILY., Disp: 30 tablet, Rfl: 1 .  omeprazole (PRILOSEC) 20 MG capsule, Take 20 mg by mouth daily., Disp: , Rfl:  .  pravastatin (PRAVACHOL) 20 MG tablet, Take 1 tablet (20 mg total) by mouth every evening., Disp: 90 tablet,  Rfl: 3 .  predniSONE (DELTASONE) 5 MG tablet, Take 5 mg by mouth daily with breakfast. , Disp: , Rfl:  .  sevelamer carbonate (RENVELA) 800 MG tablet, Take 40,000 mg by mouth 3 (three) times daily with meals. 5 tabs per meal, Disp: , Rfl:  .  warfarin (COUMADIN) 1 MG tablet, Take 1 tablet (1 mg total) by mouth daily at 6 PM. (Patient taking differently: Take 1-2 mg by mouth See admin instructions. 1 mg all days , except 2 mg on Sunday..* OR AS DIRECTED), Disp: 90 tablet, Rfl: 1  Past Medical History: Past Medical History:  Diagnosis Date  . 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 Aortic stenosis    a. 2014 s/p mech AVR ([facility])-->chronic coumadin; b. 08/2018 Echo: Mild AS/AI. Nl fxn/gradient.  . Arthritis   . Atrial fibrillation ([place])   . CAD (coronary artery disease)    a. 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 02/2018 MV ([facility]: large inf, infsept, inflat infarct w/ min rev, EF 30%; c. 06/2018 Cath: LM min irregs, LAD 60p, 95/30/84m, LCX min irregs, RCA 40p, 723m90d, RPAV 90, RPDA 95ost; c. 09/2018 CABGx3 (LIMA->LAD, VG->D1, VG->PDA).  . Chronic anticoagulation 06/2012   a. Coumadin in the setting of mech AVR and afib.  . Marland KitchenPAP (  continuous positive airway pressure) dependence   . Dialysis patient [facility]) 04/07/2006   Patient started HD around 1996,  had 1st transplant LLQ around 1998 until 2007 when they found renal cell cancer in transplant and both native kidneys, all were removed > went back on HD until 2nd transplant RLQ in 2014 which lasted 3 yrs; it was embolized for suspected acute rejection. Is currently on HD MWF in [place] w/ [facility] nephrology.  . ESRD (end stage renal disease) ([place])    a. CKD 2/2 to IgA nephropathy (dx age 85); b. 1999 s/p Kidney transplant; c. 2014 Bilat nephrectomy (transplanted kidney resected) in the setting of renal cell carcinoma; d. PD until 11/2017-->HD.  Marland Kitchen Heart murmur   . HFrEF (heart failure  with reduced ejection fraction) ([place])    a. 07/2016 Echo: EF 55-60%; b. 02/2018 MV: EF 30%; c. 04/2018 Echo: EF 35%; d. 08/2018 Echo: EF 30-35%. Glob HK w/o rwma. Mildly reduced RV fxn. Mod to sev dil LA. Nl AoV fxn.  Marland Kitchen Hx of colonoscopy    2009 by Dr. Laural Golden. Normal except for small submucosal lipoma. No evidence of polyps or colitis.  . Hyperlipidemia   . Hypertension   . Hypogonadism in male 07/25/2015  . Ischemic cardiomyopathy    a. 07/2016 Echo: EF 55-60%; b. 02/2018 MV: EF 30%; c. 04/2018 Echo: EF 35%; d. 08/2018 Echo: EF 30-35%.  . OSA (obstructive sleep apnea)    No CPAP  . Permanent atrial fibrillation    a. CHA2DS2VASc = 3-->Coumadin (also mech AVR); b. 06/2018 Amio started 2/2 elevated rates.  . Renal cell cancer ([place])   . Renal cell carcinoma ([place]) 08/04/2016    Tobacco Use: Social History   Tobacco Use  Smoking Status Never Smoker  Smokeless Tobacco Never Used    Labs: Recent Review Flowsheet Data    Labs for ITP Cardiac and Pulmonary Rehab Latest Ref Rng & Units 09/16/2018 09/16/2018 09/16/2018 09/16/2018 09/16/2018   Cholestrol 0 - 200 mg/dL - - - - -   LDLCALC 0 - 99 mg/dL - - - - -   LDLDIRECT mg/dL - - - - -   HDL >39.00 mg/dL - - - - -   Trlycerides 0.0 - 149.0 mg/dL - - - - -   Hemoglobin A1c 4.8 - 5.6 % - - - - -   PHART 7.350 - 7.450 7.393 7.390 7.375 7.329(L) -   PCO2ART 32.0 - 48.0 mmHg 45.2 43.1 44.0 48.1(H) -   HCO3 20.0 - 28.0 mmol/L 27.6 26.3 25.7 25.2 -   TCO2 22 - 32 mmol/L _0 ACIDBASEDEF 0.0 - 2.0 mmol/L - - - 1.0 -   O2SAT % 98.0 98.0 99.0 99.0 -       Exercise Target Goals: Exercise Program Goal: Individual exercise prescription set using results from initial 6 min walk test and THRR while considering  patient's activity barriers and safety.   Exercise Prescription Goal: Initial exercise prescription builds to 30-45 minutes a day of aerobic activity, 2-3 days per week.  Home exercise guidelines will be given to patient during program  as part of exercise prescription that the participant will acknowledge.  Activity Barriers & Risk Stratification: Activity Barriers & Cardiac Risk Stratification - 11/04/18 1054      Activity Barriers & Cardiac Risk Stratification   Activity Barriers  Joint Problems;Muscular Weakness    Cardiac Risk Stratification  High       6 Minute Walk: 6  Minute Walk    Row Name 11/09/18 1543         6 Minute Walk   Distance  1288 feet     Walk Time  6 minutes     # of Rest Breaks  0     MPH  2.44     METS  3.6     RPE  11     VO2 Peak  12.6     Symptoms  No     Resting HR  74 bpm     Resting BP  122/60     Max Ex. HR  116 bpm     Max Ex. BP  134/62     2 Minute Post BP  112/62        Oxygen Initial Assessment:   Oxygen Re-Evaluation:   Oxygen Discharge (Final Oxygen Re-Evaluation):   Initial Exercise Prescription: Initial Exercise Prescription - 11/09/18 1500      Date of Initial Exercise RX and Referring Provider   Date  11/09/18    Referring Provider  [name]      Treadmill   MPH  2.5    Grade  2    Minutes  15    METs  3.6      NuStep   Level  4    SPM  80    Minutes  15    METs  3.6      REL-XR   Level  3    Speed  50    Minutes  15    METs  3.6      Prescription Details   Frequency (times per week)  2    Duration  Progress to 30 minutes of continuous aerobic without signs/symptoms of physical distress      Intensity   THRR 40-80% of Max Heartrate  115-144    Ratings of Perceived Exertion  11-15    Perceived Dyspnea  0-4      Progression   Progression  Continue to progress workloads to maintain intensity without signs/symptoms of physical distress.      Resistance Training   Training Prescription  Yes    Weight  3 lb    Reps  10-15       Perform Capillary Blood Glucose checks as needed.  Exercise Prescription Changes: Exercise Prescription Changes    Row Name 11/10/18 1200 11/18/18 0800 11/26/18 0800 11/30/18 1500       Response to  Exercise   Blood Pressure (Admit)  122/60  -  138/68  132/60    Blood Pressure (Exercise)  134/62  -  150/80  138/68    Blood Pressure (Exit)  112/62  -  104/60  114/60    Heart Rate (Admit)  74 bpm  -  76 bpm  83 bpm    Heart Rate (Exercise)  116 bpm  -  111 bpm  115 bpm    Heart Rate (Exit)  83 bpm  -  89 bpm  89 bpm    Rating of Perceived Exertion (Exercise)  11  -  13  13    Symptoms  none  -  none  none    Duration  -  -  Progress to 30 minutes of  aerobic without signs/symptoms of physical distress  Continue with 30 min of aerobic exercise without signs/symptoms of physical distress.    Intensity  -  -  THRR unchanged  THRR unchanged      Progression  Progression  -  -  Continue to progress workloads to maintain intensity without signs/symptoms of physical distress.  Continue to progress workloads to maintain intensity without signs/symptoms of physical distress.    Average METs  -  -  2.9  2.8      Resistance Training   Training Prescription  -  -  Yes  Yes    Weight  -  -  4  4    Reps  -  -  10-15  10-15      Interval Training   Interval Training  -  -  No  -      Treadmill   MPH  -  -  2.5  -    Grade  -  -  2  -    Minutes  -  -  15  -    METs  -  -  3.6  -      NuStep   Level  -  -  4  5    SPM  -  -  50  50    Minutes  -  -  15  15    METs  -  -  2.8  3.6      REL-XR   Level  -  -  1.5  2    Speed  -  -  50  50    Minutes  -  -  15  15    METs  -  -  1.9  1.9      Home Exercise Plan   Plans to continue exercise at  -  Home (comment) walking  Home (comment) walking  Home (comment) walking    Frequency  -  Add 2 additional days to program exercise sessions.  Add 2 additional days to program exercise sessions.  Add 2 additional days to program exercise sessions.    Initial Home Exercises Provided  -  11/18/18  11/18/18  11/18/18       Exercise Comments: Exercise Comments    Row Name 11/11/18 0840 11/16/18 0828 12/09/18 0912       Exercise Comments   First full day of exercise!  Patient was oriented to gym and equipment including functions, settings, policies, and procedures.  Patient's individual exercise prescription and treatment plan were reviewed.  All starting workloads were established based on the results of the 6 minute walk test done at initial orientation visit.  The plan for exercise progression was also introduced and progression will be customized based on patient's performance and goals.  Jaclyn stated that after he went home last week his chest was very sore.  Remains sore today. He was advised to decrease or not use weights today.  Also to call his doctor if pain continues  D'Arcy had lower BP today after dialysis yesterday        Exercise Goals and Review: Exercise Goals    Row Name 11/09/18 1548             Exercise Goals   Increase Physical Activity  Yes       Expected Outcomes  Short Term: Attend rehab on a regular basis to increase amount of physical activity.;Long Term: Add in home exercise to make exercise part of routine and to increase amount of physical activity.;Long Term: Exercising regularly at least 3-5 days a week.       Increase Strength and Stamina  Yes       Intervention  Provide advice, education, support and counseling about physical activity/exercise needs.;Develop an individualized exercise prescription for aerobic and resistive training based on initial evaluation findings, risk stratification, comorbidities and participant's personal goals.       Expected Outcomes  Short Term: Increase workloads from initial exercise prescription for resistance, speed, and METs.;Short Term: Perform resistance training exercises routinely during rehab and add in resistance training at home;Long Term: Improve cardiorespiratory fitness, muscular endurance and strength as measured by increased METs and functional capacity (6MWT)       Able to understand and use rate of perceived exertion (RPE) scale  Yes       Intervention   Provide education and explanation on how to use RPE scale       Expected Outcomes  Short Term: Able to use RPE daily in rehab to express subjective intensity level;Long Term:  Able to use RPE to guide intensity level when exercising independently       Able to understand and use Dyspnea scale  Yes       Intervention  Provide education and explanation on how to use Dyspnea scale       Expected Outcomes  Short Term: Able to use Dyspnea scale daily in rehab to express subjective sense of shortness of breath during exertion;Long Term: Able to use Dyspnea scale to guide intensity level when exercising independently       Knowledge and understanding of Target Heart Rate Range (THRR)  Yes       Intervention  Provide education and explanation of THRR including how the numbers were predicted and where they are located for reference       Expected Outcomes  Short Term: Able to state/look up THRR;Short Term: Able to use daily as guideline for intensity in rehab;Long Term: Able to use THRR to govern intensity when exercising independently       Able to check pulse independently  Yes       Intervention  Provide education and demonstration on how to check pulse in carotid and radial arteries.;Review the importance of being able to check your own pulse for safety during independent exercise       Expected Outcomes  Short Term: Able to explain why pulse checking is important during independent exercise;Long Term: Able to check pulse independently and accurately       Understanding of Exercise Prescription  Yes       Intervention  Provide education, explanation, and written materials on patient's individual exercise prescription       Expected Outcomes  Short Term: Able to explain program exercise prescription;Long Term: Able to explain home exercise prescription to exercise independently          Exercise Goals Re-Evaluation : Exercise Goals Re-Evaluation    Row Name 11/11/18 0840 11/18/18 0819 11/26/18 0816  11/30/18 1544       Exercise Goal Re-Evaluation   Exercise Goals Review  Increase Physical Activity;Increase Strength and Stamina;Able to understand and use rate of perceived exertion (RPE) scale;Knowledge and understanding of Target Heart Rate Range (THRR);Understanding of Exercise Prescription  Increase Physical Activity;Increase Strength and Stamina;Able to understand and use rate of perceived exertion (RPE) scale;Knowledge and understanding of Target Heart Rate Range (THRR);Understanding of Exercise Prescription;Able to understand and use Dyspnea scale;Able to check pulse independently  Increase Physical Activity;Increase Strength and Stamina;Able to understand and use rate of perceived exertion (RPE) scale;Knowledge and understanding of Target Heart Rate Range (THRR);Understanding of Exercise Prescription  Increase Physical Activity;Increase Strength and Stamina;Able to  understand and use rate of perceived exertion (RPE) scale;Able to understand and use Dyspnea scale;Knowledge and understanding of Target Heart Rate Range (THRR);Understanding of Exercise Prescription    Comments  Reviewed RPE scale, THR and program prescription with pt today.  Pt voiced understanding and was given a copy of goals to take home.  Reviewed home exercise with pt today.  Pt plans to walk at home for exercise.  Reviewed THR, pulse, RPE, sign and symptoms, NTG use, and when to call 911 or MD.  Also discussed weather considerations and indoor options.  Pt voiced understanding.  Jullian is doing well in rehab so far.  He plans to increase levels at next session.  he attends 2 days per week due to dialysis on other days.  Tamika increased levels on both machines today.  he is walking and doing strength work 2 days per week outside class.    Expected Outcomes  Short: Use RPE daily to regulate intensity. Long: Follow program prescription in THR.  Short: Start to add in walking on off days.  Long: Continue to follow program prescription   Short - continue to attend consistently Long :  increase overall MET level  Short - continue home exercise Long - maintain fitness independently       Discharge Exercise Prescription (Final Exercise Prescription Changes): Exercise Prescription Changes - 11/30/18 1500      Response to Exercise   Blood Pressure (Admit)  132/60    Blood Pressure (Exercise)  138/68    Blood Pressure (Exit)  114/60    Heart Rate (Admit)  83 bpm    Heart Rate (Exercise)  115 bpm    Heart Rate (Exit)  89 bpm    Rating of Perceived Exertion (Exercise)  13    Symptoms  none    Duration  Continue with 30 min of aerobic exercise without signs/symptoms of physical distress.    Intensity  THRR unchanged      Progression   Progression  Continue to progress workloads to maintain intensity without signs/symptoms of physical distress.    Average METs  2.8      Resistance Training   Training Prescription  Yes    Weight  4    Reps  10-15      NuStep   Level  5    SPM  50    Minutes  15    METs  3.6      REL-XR   Level  2    Speed  50    Minutes  15    METs  1.9      Home Exercise Plan   Plans to continue exercise at  Home (comment)   walking   Frequency  Add 2 additional days to program exercise sessions.    Initial Home Exercises Provided  11/18/18       Nutrition:  Target Goals: Understanding of nutrition guidelines, daily intake of sodium <1583m, cholesterol <2059m calories 30% from fat and 7% or less from saturated fats, daily to have 5 or more servings of fruits and vegetables.  Biometrics: Pre Biometrics - 11/09/18 1549      Pre Biometrics   Height  5' 9.75" (1.772 m)    Weight  188 lb 8 oz (85.5 kg)    BMI (Calculated)  27.23    Single Leg Stand  7.06 seconds        Nutrition Therapy Plan and Nutrition Goals: Nutrition Therapy & Goals - 12/07/18 [id number]  Personal Nutrition Goals   Comments  Pt reported in the hospital (renal and HH diet) his fluid retention dropped and his  stomach was not as "bloated". Pt has renal diettian follwoing him; went over CKD on dialysis basics and provided handouts. Went over gout basics and provided handout (pt reports organ meats, steak and pork chops are his most noticible triggers and is on medication). Pt is on coumadin and continues to eat a consistent amount of leafy greens (working with doctor). Discussed Hh eating in the context of his other issues, discussed how it will be a balancing act and we may have to fine tune things. Pt reports protein is WNL since including eggs, he takes his renal vitamin after dialysis, and he is on a phosphate binder. Pt reports eating cheerios or oatmeal on non-dialysis days and nothing on dialysis days for B. L both days is eggs with [place] bacon. S grits and peanut butter. D chicken or fish and vegtables. Pt reports sometimes eating fried food but cut out fried foods and red meat for the most part. Discussed low Na eating. Pt is also mindful of the amount of liquids he consumes (usually water, tea, or juice).       Nutrition Assessments: Nutrition Assessments - 11/11/18 1138      MEDFICTS Scores   Pre Score  43       Nutrition Goals Re-Evaluation: Nutrition Goals Re-Evaluation    Row Name 12/07/18 0809             Goals   Current Weight  188 lb (85.3 kg)       Nutrition Goal  ST: +1 serving of F/V/day (now having 2/day) LT: gain strength and get back to doing what he was doing before, reducing fluid retention.       Comment  starting him on vitamin D. Energy has been good, but having issues with joints (knees and hips) - not sure if medications - sent message to cardiologist. Told him to refer to the renal handout I gave him to see fruits and vegetables that work with his renal diet. Pt to have report card from dialysis this week. Pt reports energy is good. Pt now limits fried foods and instead uses shake n' bake for chicken and BBQ on pork chops baked - advised to just be mindful of the salt  in BBQ sauce and shake n' Bake. Pt reports dry weight is 85 kg.       Expected Outcome  Manage edema, dialysis, and gout.          Nutrition Goals Discharge (Final Nutrition Goals Re-Evaluation): Nutrition Goals Re-Evaluation - 12/07/18 0809      Goals   Current Weight  188 lb (85.3 kg)    Nutrition Goal  ST: +1 serving of F/V/day (now having 2/day) LT: gain strength and get back to doing what he was doing before, reducing fluid retention.    Comment  starting him on vitamin D. Energy has been good, but having issues with joints (knees and hips) - not sure if medications - sent message to cardiologist. Told him to refer to the renal handout I gave him to see fruits and vegetables that work with his renal diet. Pt to have report card from dialysis this week. Pt reports energy is good. Pt now limits fried foods and instead uses shake n' bake for chicken and BBQ on pork chops baked - advised to just be mindful of the salt in BBQ sauce and  shake n' Bake. Pt reports dry weight is 85 kg.    Expected Outcome  Manage edema, dialysis, and gout.       Psychosocial: Target Goals: Acknowledge presence or absence of significant depression and/or stress, maximize coping skills, provide positive support system. Participant is able to verbalize types and ability to use techniques and skills needed for reducing stress and depression.   Initial Review & Psychosocial Screening: Initial Psych Review & Screening - 11/04/18 1049      Initial Review   Current issues with  Current Stress Concerns    Source of Stress Concerns  Chronic Illness    Comments  Nephtali is on HD M,W,F. He has a good support system and is handling it well. He stays on top of managing his health, but it gets exhausting at times.      Family Dynamics   Good Support System?  Yes      Barriers   Psychosocial barriers to participate in program  There are no identifiable barriers or psychosocial needs.      Screening Interventions    Interventions  Encouraged to exercise;Provide feedback about the scores to participant    Expected Outcomes  Short Term goal: Utilizing psychosocial counselor, staff and physician to assist with identification of specific Stressors or current issues interfering with healing process. Setting desired goal for each stressor or current issue identified.;Long Term Goal: Stressors or current issues are controlled or eliminated.;Short Term goal: Identification and review with participant of any Quality of Life or Depression concerns found by scoring the questionnaire.;Long Term goal: The participant improves quality of Life and PHQ9 Scores as seen by post scores and/or verbalization of changes       Quality of Life Scores:  Quality of Life - 11/11/18 1138      Quality of Life   Select  Quality of Life      Quality of Life Scores   Health/Function Pre  20.73 %    Socioeconomic Pre  19 %    Psych/Spiritual Pre  20.86 %    Family Pre  20.6 %    GLOBAL Pre  20.38 %      Scores of 19 and below usually indicate a poorer quality of life in these areas.  A difference of  2-3 points is a clinically meaningful difference.  A difference of 2-3 points in the total score of the Quality of Life Index has been associated with significant improvement in overall quality of life, self-image, physical symptoms, and general health in studies assessing change in quality of life.  PHQ-9: Recent Review Flowsheet Data    Depression screen [facility] 2/9 11/09/2018 03/19/2018 05/17/2017   Decreased Interest 0 0 3   Down, Depressed, Hopeless 0 0 3   PHQ - 2 Score 0 0 6   Altered sleeping 0 - 3   Tired, decreased energy 0 - 3   Change in appetite 0 - 3   Feeling bad or failure about yourself  0 - 3   Trouble concentrating 0 - 2   Moving slowly or fidgety/restless 0 - 2   Suicidal thoughts 0 - 3   PHQ-9 Score 0 - 25   Difficult doing work/chores Not difficult at all - Extremely dIfficult     Interpretation of Total Score   Total Score Depression Severity:  1-4 = Minimal depression, 5-9 = Mild depression, 10-14 = Moderate depression, 15-19 = Moderately severe depression, 20-27 = Severe depression   Psychosocial Evaluation and Intervention:  Psychosocial Re-Evaluation:   Psychosocial Discharge (Final Psychosocial Re-Evaluation):   Vocational Rehabilitation: Provide vocational rehab assistance to qualifying candidates.   Vocational Rehab Evaluation & Intervention: Vocational Rehab - 11/04/18 1048      Initial Vocational Rehab Evaluation & Intervention   Assessment shows need for Vocational Rehabilitation  No       Education: Education Goals: Education classes will be provided on a variety of topics geared toward better understanding of heart health and risk factor modification. Participant will state understanding/return demonstration of topics presented as noted by education test scores.  Learning Barriers/Preferences: Learning Barriers/Preferences - 11/04/18 1048      Learning Barriers/Preferences   Learning Barriers  None    Learning Preferences  None       Education Topics:  AED/CPR: - Group verbal and written instruction with the use of models to demonstrate the basic use of the AED with the basic ABC's of resuscitation.   General Nutrition Guidelines/Fats and Fiber: -Group instruction provided by verbal, written material, models and posters to present the general guidelines for heart healthy nutrition. Gives an explanation and review of dietary fats and fiber.   Controlling Sodium/Reading Food Labels: -Group verbal and written material supporting the discussion of sodium use in heart healthy nutrition. Review and explanation with models, verbal and written materials for utilization of the food label.   Exercise Physiology & General Exercise Guidelines: - Group verbal and written instruction with models to review the exercise physiology of the cardiovascular system and associated  critical values. Provides general exercise guidelines with specific guidelines to those with heart or lung disease.    Aerobic Exercise & Resistance Training: - Gives group verbal and written instruction on the various components of exercise. Focuses on aerobic and resistive training programs and the benefits of this training and how to safely progress through these programs..   Flexibility, Balance, Mind/Body Relaxation: Provides group verbal/written instruction on the benefits of flexibility and balance training, including mind/body exercise modes such as yoga, pilates and tai chi.  Demonstration and skill practice provided.   Stress and Anxiety: - Provides group verbal and written instruction about the health risks of elevated stress and causes of high stress.  Discuss the correlation between heart/lung disease and anxiety and treatment options. Review healthy ways to manage with stress and anxiety.   Depression: - Provides group verbal and written instruction on the correlation between heart/lung disease and depressed mood, treatment options, and the stigmas associated with seeking treatment.   Anatomy & Physiology of the Heart: - Group verbal and written instruction and models provide basic cardiac anatomy and physiology, with the coronary electrical and arterial systems. Review of Valvular disease and Heart Failure   Cardiac Procedures: - Group verbal and written instruction to review commonly prescribed medications for heart disease. Reviews the medication, class of the drug, and side effects. Includes the steps to properly store meds and maintain the prescription regimen. (beta blockers and nitrates)   Cardiac Medications I: - Group verbal and written instruction to review commonly prescribed medications for heart disease. Reviews the medication, class of the drug, and side effects. Includes the steps to properly store meds and maintain the prescription regimen.   Cardiac  Medications II: -Group verbal and written instruction to review commonly prescribed medications for heart disease. Reviews the medication, class of the drug, and side effects. (all other drug classes)    Go Sex-Intimacy & Heart Disease, Get SMART - Goal Setting: - Group verbal and written instruction through  game format to discuss heart disease and the return to sexual intimacy. Provides group verbal and written material to discuss and apply goal setting through the application of the S.M.A.R.T. Method.   Other Matters of the Heart: - Provides group verbal, written materials and models to describe Stable Angina and Peripheral Artery. Includes description of the disease process and treatment options available to the cardiac patient.   Exercise & Equipment Safety: - Individual verbal instruction and demonstration of equipment use and safety with use of the equipment.   Cardiac Rehab from 11/09/2018 in [facility] Cardiac and Pulmonary Rehab  Date  11/09/18  Educator  AS  Instruction Review Code  1- Verbalizes Understanding      Infection Prevention: - Provides verbal and written material to individual with discussion of infection control including proper hand washing and proper equipment cleaning during exercise session.   Cardiac Rehab from 11/09/2018 in [facility] Cardiac and Pulmonary Rehab  Date  11/09/18  Educator  AS  Instruction Review Code  1- Verbalizes Understanding      Falls Prevention: - Provides verbal and written material to individual with discussion of falls prevention and safety.   Cardiac Rehab from 11/09/2018 in [facility] Cardiac and Pulmonary Rehab  Date  11/09/18  Educator  AS  Instruction Review Code  1- Verbalizes Understanding      Diabetes: - Individual verbal and written instruction to review signs/symptoms of diabetes, desired ranges of glucose level fasting, after meals and with exercise. Acknowledge that pre and post exercise glucose checks will be done for 3 sessions at  entry of program.   Know Your Numbers and Risk Factors: -Group verbal and written instruction about important numbers in your health.  Discussion of what are risk factors and how they play a role in the disease process.  Review of Cholesterol, Blood Pressure, Diabetes, and BMI and the role they play in your overall health.   Sleep Hygiene: -Provides group verbal and written instruction about how sleep can affect your health.  Define sleep hygiene, discuss sleep cycles and impact of sleep habits. Review good sleep hygiene tips.    Other: -Provides group and verbal instruction on various topics (see comments)   Knowledge Questionnaire Score: Knowledge Questionnaire Score - 11/11/18 1138      Knowledge Questionnaire Score   Pre Score  21/26  Angina, Exercise.Depression missed       Core Components/Risk Factors/Patient Goals at Admission: Personal Goals and Risk Factors at Admission - 11/09/18 1552      Core Components/Risk Factors/Patient Goals on Admission    Weight Management  Weight Maintenance    Heart Failure  Yes    Hypertension  Yes    Intervention  Provide education on lifestyle modifcations including regular physical activity/exercise, weight management, moderate sodium restriction and increased consumption of fresh fruit, vegetables, and low fat dairy, alcohol moderation, and smoking cessation.;Monitor prescription use compliance.    Expected Outcomes  Short Term: Continued assessment and intervention until BP is < 140/76m HG in hypertensive participants. < 130/834mHG in hypertensive participants with diabetes, heart failure or chronic kidney disease.;Long Term: Maintenance of blood pressure at goal levels.    Intervention  Provide education and support for participant on nutrition & aerobic/resistive exercise along with prescribed medications to achieve LDL <7075mHDL >11m50m  Expected Outcomes  Long Term: Cholesterol controlled with medications as prescribed, with  individualized exercise RX and with personalized nutrition plan. Value goals: LDL < 70mg7mL > 40 mg.  Stress  Yes       Core Components/Risk Factors/Patient Goals Review:  Goals and Risk Factor Review    Row Name 11/30/18 [phone]             Core Components/Risk Factors/Patient Goals Review   Personal Goals Review  Weight Management/Obesity;Develop more efficient breathing techniques such as purse lipped breathing and diaphragmatic breathing and practicing self-pacing with activity.;Hypertension;Lipids       Review  Deonta is taking meds as directed and measuring BP at home.  BP has been running normal at home and at [facility].  He has some knee and hip pain - was taking hydrochloriquine which helped but he cant take it at this time.  Odel has "aggressive osteoarthritis".  We discussed doing some ROM exercises to help with this.  He also takes Tylenol.       Expected Outcomes  Short - continue exercise two days not at HT - add ROM exercises Long - maintain overall exercise and health habits          Core Components/Risk Factors/Patient Goals at Discharge (Final Review):  Goals and Risk Factor Review - 11/30/18 0811      Core Components/Risk Factors/Patient Goals Review   Personal Goals Review  Weight Management/Obesity;Develop more efficient breathing techniques such as purse lipped breathing and diaphragmatic breathing and practicing self-pacing with activity.;Hypertension;Lipids    Review  Lain is taking meds as directed and measuring BP at home.  BP has been running normal at home and at [facility].  He has some knee and hip pain - was taking hydrochloriquine which helped but he cant take it at this time.  Korde has "aggressive osteoarthritis".  We discussed doing some ROM exercises to help with this.  He also takes Tylenol.    Expected Outcomes  Short - continue exercise two days not at HT - add ROM exercises Long - maintain overall exercise and health habits       ITP Comments: ITP Comments    Row  Name 11/04/18 1055 11/09/18 1542 11/11/18 0840 11/16/18 0825 11/17/18 0548   ITP Comments  Virtual RN orientation completed. Diagnosis can be found in [facility] 6/8. EP/RD orientation scheduled for 8/4 at 1:30  Completed initial 6 MWT and nutrition consult.  Initial ITP created and sent to Dr Emily Filbert, Medical Director  First full day of exercise!  Patient was oriented to gym and equipment including functions, settings, policies, and procedures.  Patient's individual exercise prescription and treatment plan were reviewed.  All starting workloads were established based on the results of the 6 minute walk test done at initial orientation visit.  The plan for exercise progression was also introduced and progression will be customized based on patient's performance and goals.  Aarav stated that after he went home last week his chest was very sore.  Remains sore today. He was advised to decrease or not use weights today.  Also to call his doctor if pain continues.  30 Day Review Completed today. Continue with ITP unless changed by Medical Director review.   Row Name 12/09/18 0911 12/15/18 0544         ITP Comments  Deavon had lower BP today after dialysis yesterday  30 Day review. Continue with ITP unless directed changes per Medical Director review.         Comments:

## 2018-12-16 ENCOUNTER — Telehealth: Payer: Self-pay

## 2018-12-16 ENCOUNTER — Encounter: Payer: Medicare Other | Admitting: *Deleted

## 2018-12-16 ENCOUNTER — Telehealth: Payer: Self-pay | Admitting: Cardiovascular Disease

## 2018-12-16 ENCOUNTER — Ambulatory Visit (INDEPENDENT_AMBULATORY_CARE_PROVIDER_SITE_OTHER): Payer: Medicare Other | Admitting: Cardiovascular Disease

## 2018-12-16 ENCOUNTER — Other Ambulatory Visit: Payer: Self-pay

## 2018-12-16 DIAGNOSIS — I120 Hypertensive chronic kidney disease with stage 5 chronic kidney disease or end stage renal disease: Secondary | ICD-10-CM | POA: Diagnosis not present

## 2018-12-16 DIAGNOSIS — D631 Anemia in chronic kidney disease: Secondary | ICD-10-CM | POA: Diagnosis not present

## 2018-12-16 DIAGNOSIS — Z5181 Encounter for therapeutic drug level monitoring: Secondary | ICD-10-CM | POA: Diagnosis not present

## 2018-12-16 DIAGNOSIS — I2510 Atherosclerotic heart disease of native coronary artery without angina pectoris: Secondary | ICD-10-CM | POA: Diagnosis not present

## 2018-12-16 DIAGNOSIS — I5020 Unspecified systolic (congestive) heart failure: Secondary | ICD-10-CM | POA: Diagnosis not present

## 2018-12-16 DIAGNOSIS — Z951 Presence of aortocoronary bypass graft: Secondary | ICD-10-CM

## 2018-12-16 DIAGNOSIS — I4821 Permanent atrial fibrillation: Secondary | ICD-10-CM

## 2018-12-16 DIAGNOSIS — Z952 Presence of prosthetic heart valve: Secondary | ICD-10-CM | POA: Diagnosis not present

## 2018-12-16 DIAGNOSIS — E785 Hyperlipidemia, unspecified: Secondary | ICD-10-CM | POA: Diagnosis not present

## 2018-12-16 DIAGNOSIS — I5022 Chronic systolic (congestive) heart failure: Secondary | ICD-10-CM

## 2018-12-16 NOTE — Telephone Encounter (Signed)
Patient calling in INR level of 2.1 from 9/8

## 2018-12-16 NOTE — Patient Instructions (Addendum)
Description   Spoke with pt and instructed pt that since he took 1 tablet yesterday (an extra 1/2 tablet) to continue dosage of 1 TABLET EVERY DAY EXCEPT 1/2 TABLET ON MONDAYS, [place]. Recheck INR in 1 week - call [phone] w/ results.

## 2018-12-16 NOTE — Telephone Encounter (Addendum)
Spoke with the patient to f/u on his 12/14/18 mychart. Adv the patient that Dr. [name] recommends he hold his Metoprolol on the days he has dialysis. Patient sts that he has resumed Midodrine 10mg  prn for low blood pressure. Patient's med list updated. Patient sts that nephrology has adjusted his dry weight up by 1kg starting on 12/15/18.  A Physician Assistant at his nephrologist office adv him to talk with Dr. [name] regarding repeating his echo to re-evaluate his EF due to the ongoing hypotension.  Adv the that I have talked with Dr. [name] and he agrees. Order for echo in [place]. Adv the patient that a scheduler will contact him to schedule. Patient verbalized understanding and voiced appreciation for the call.

## 2018-12-16 NOTE — Telephone Encounter (Signed)
No ans no vm   °

## 2018-12-16 NOTE — Progress Notes (Signed)
Daily Session Note  Patient Details  Name: VERGIL BURBY MRN: [id number] Date of Birth: [birth year] Referring Provider:     Cardiac Rehab from 11/09/2018 in [facility] Cardiac and Pulmonary Rehab  Referring Provider  [name]      Encounter Date: 12/16/2018  Check In: Session Check In - 12/16/18 0801      Check-In   Supervising physician immediately available to respond to emergencies  See telemetry face sheet for immediately available ER MD    Location  [facility]-Cardiac & Pulmonary Rehab    Staff Present  Heath Lark, RN, BSN, CCRP;Jeanna Durrell BS, Exercise Physiologist;Amanda Oletta Darter, BA, ACSM CEP, Exercise Physiologist    Virtual Visit  No    Medication changes reported      No    Fall or balance concerns reported     No    Warm-up and Cool-down  Performed on first and last piece of equipment    Resistance Training Performed  Yes    VAD Patient?  No    PAD/SET Patient?  No      Pain Assessment   Currently in Pain?  No/denies          Social History   Tobacco Use  Smoking Status Never Smoker  Smokeless Tobacco Never Used    Goals Met:  Independence with exercise equipment Exercise tolerated well No report of cardiac concerns or symptoms Strength training completed today  Goals Unmet:  Not Applicable  Comments: Pt able to follow exercise prescription today without complaint.  Will continue to monitor for progression.    Dr. Emily Filbert is Medical Director for [place] and [facility].

## 2018-12-16 NOTE — Telephone Encounter (Signed)
Spoke with pt and please refer to dosing instructions per Anticoagulation Encounter.

## 2018-12-16 NOTE — Telephone Encounter (Signed)
-----   Message from Jeremy Laundry, RN sent at 12/16/2018  3:57 PM EDT ----- Regarding: patient need an echo Please call this patient to schedule an echo.  Thanks, [name]

## 2018-12-17 DIAGNOSIS — D631 Anemia in chronic kidney disease: Secondary | ICD-10-CM | POA: Diagnosis not present

## 2018-12-17 DIAGNOSIS — N186 End stage renal disease: Secondary | ICD-10-CM | POA: Diagnosis not present

## 2018-12-17 DIAGNOSIS — N2581 Secondary hyperparathyroidism of renal origin: Secondary | ICD-10-CM | POA: Diagnosis not present

## 2018-12-17 DIAGNOSIS — Z23 Encounter for immunization: Secondary | ICD-10-CM | POA: Diagnosis not present

## 2018-12-17 DIAGNOSIS — Z992 Dependence on renal dialysis: Secondary | ICD-10-CM | POA: Diagnosis not present

## 2018-12-17 NOTE — Telephone Encounter (Signed)
Echo scheduled for 01/13/19 @ 10:30am

## 2018-12-20 ENCOUNTER — Ambulatory Visit (INDEPENDENT_AMBULATORY_CARE_PROVIDER_SITE_OTHER): Payer: Medicare Other

## 2018-12-20 ENCOUNTER — Telehealth: Payer: Self-pay | Admitting: Cardiovascular Disease

## 2018-12-20 DIAGNOSIS — Z992 Dependence on renal dialysis: Secondary | ICD-10-CM | POA: Diagnosis not present

## 2018-12-20 DIAGNOSIS — I4821 Permanent atrial fibrillation: Secondary | ICD-10-CM

## 2018-12-20 DIAGNOSIS — Z952 Presence of prosthetic heart valve: Secondary | ICD-10-CM | POA: Diagnosis not present

## 2018-12-20 DIAGNOSIS — N186 End stage renal disease: Secondary | ICD-10-CM | POA: Diagnosis not present

## 2018-12-20 DIAGNOSIS — Z5181 Encounter for therapeutic drug level monitoring: Secondary | ICD-10-CM

## 2018-12-20 DIAGNOSIS — N2581 Secondary hyperparathyroidism of renal origin: Secondary | ICD-10-CM | POA: Diagnosis not present

## 2018-12-20 DIAGNOSIS — Z23 Encounter for immunization: Secondary | ICD-10-CM | POA: Diagnosis not present

## 2018-12-20 DIAGNOSIS — D631 Anemia in chronic kidney disease: Secondary | ICD-10-CM | POA: Diagnosis not present

## 2018-12-20 LAB — POCT INR: INR: 2.5 (ref 2.0–3.0)

## 2018-12-20 NOTE — Telephone Encounter (Signed)
If Home Health RN is calling please get Coumadin Nurse on the phone STAT  1.  Are you calling in regards to an appointment? NO   2.  Are you calling for a refill ? NO   3.  Are you having bleeding issues? NO   4.  Do you need clearance to hold Coumadin? NO   9/13   2.5  Self testing . Please call    Please route to the Coumadin Clinic Pool

## 2018-12-20 NOTE — Telephone Encounter (Signed)
Please see anti-coag note for today. 

## 2018-12-20 NOTE — Patient Instructions (Signed)
Spoke with pt and instructed him to continue dosage of 1 TABLET EVERY DAY EXCEPT 1/2 TABLET ON MONDAYS, [place].  Recheck INR in 1 week - call [phone] w/ results.

## 2018-12-21 ENCOUNTER — Other Ambulatory Visit: Payer: Self-pay

## 2018-12-21 ENCOUNTER — Encounter: Payer: Medicare Other | Admitting: *Deleted

## 2018-12-21 DIAGNOSIS — I2510 Atherosclerotic heart disease of native coronary artery without angina pectoris: Secondary | ICD-10-CM | POA: Diagnosis not present

## 2018-12-21 DIAGNOSIS — D631 Anemia in chronic kidney disease: Secondary | ICD-10-CM | POA: Diagnosis not present

## 2018-12-21 DIAGNOSIS — E785 Hyperlipidemia, unspecified: Secondary | ICD-10-CM | POA: Diagnosis not present

## 2018-12-21 DIAGNOSIS — I5020 Unspecified systolic (congestive) heart failure: Secondary | ICD-10-CM | POA: Diagnosis not present

## 2018-12-21 DIAGNOSIS — Z951 Presence of aortocoronary bypass graft: Secondary | ICD-10-CM | POA: Diagnosis not present

## 2018-12-21 DIAGNOSIS — I120 Hypertensive chronic kidney disease with stage 5 chronic kidney disease or end stage renal disease: Secondary | ICD-10-CM | POA: Diagnosis not present

## 2018-12-21 NOTE — Progress Notes (Signed)
Daily Session Note  Patient Details  Name: Jeremy [name] MRN: [id number] Date of Birth: December 13, 1955 Referring Provider:     Cardiac Rehab from 11/09/2018 in [facility] Cardiac and Pulmonary Rehab  Referring Provider  [name]      Encounter Date: 12/21/2018  Check In: Session Check In - 12/21/18 0805      Check-In   Supervising physician immediately available to respond to emergencies  See telemetry face sheet for immediately available ER MD    Location  [facility]-Cardiac & Pulmonary Rehab    Staff Present  Heath Lark, RN, BSN, CCRP;Joseph Foy [place], [place], ACSM CEP, Exercise Physiologist    Virtual Visit  No    Medication changes reported      No    Fall or balance concerns reported     No    Warm-up and Cool-down  Performed on first and last piece of equipment    Resistance Training Performed  Yes    VAD Patient?  No    PAD/SET Patient?  No      Pain Assessment   Currently in Pain?  No/denies          Social History   Tobacco Use  Smoking Status Never Smoker  Smokeless Tobacco Never Used    Goals Met:  Independence with exercise equipment Exercise tolerated well No report of cardiac concerns or symptoms  Goals Unmet:  Not Applicable  Comments: Pt able to follow exercise prescription today without complaint.  Will continue to monitor for progression.    Dr. Emily Filbert is Medical Director for [place] and [facility].

## 2018-12-22 ENCOUNTER — Other Ambulatory Visit: Payer: Self-pay | Admitting: Thoracic Surgery (Cardiothoracic Vascular Surgery)

## 2018-12-22 ENCOUNTER — Encounter: Payer: Self-pay | Admitting: Cardiovascular Disease

## 2018-12-22 DIAGNOSIS — N2581 Secondary hyperparathyroidism of renal origin: Secondary | ICD-10-CM | POA: Diagnosis not present

## 2018-12-22 DIAGNOSIS — Z992 Dependence on renal dialysis: Secondary | ICD-10-CM | POA: Diagnosis not present

## 2018-12-22 DIAGNOSIS — D631 Anemia in chronic kidney disease: Secondary | ICD-10-CM | POA: Diagnosis not present

## 2018-12-22 DIAGNOSIS — Z23 Encounter for immunization: Secondary | ICD-10-CM | POA: Diagnosis not present

## 2018-12-22 DIAGNOSIS — N186 End stage renal disease: Secondary | ICD-10-CM | POA: Diagnosis not present

## 2018-12-22 NOTE — Telephone Encounter (Signed)
This encounter was created in error - please disregard.

## 2018-12-23 ENCOUNTER — Other Ambulatory Visit: Payer: Self-pay

## 2018-12-23 DIAGNOSIS — E785 Hyperlipidemia, unspecified: Secondary | ICD-10-CM | POA: Diagnosis not present

## 2018-12-23 DIAGNOSIS — I2510 Atherosclerotic heart disease of native coronary artery without angina pectoris: Secondary | ICD-10-CM | POA: Diagnosis not present

## 2018-12-23 DIAGNOSIS — Z951 Presence of aortocoronary bypass graft: Secondary | ICD-10-CM | POA: Diagnosis not present

## 2018-12-23 DIAGNOSIS — I5020 Unspecified systolic (congestive) heart failure: Secondary | ICD-10-CM | POA: Diagnosis not present

## 2018-12-23 DIAGNOSIS — I120 Hypertensive chronic kidney disease with stage 5 chronic kidney disease or end stage renal disease: Secondary | ICD-10-CM | POA: Diagnosis not present

## 2018-12-23 DIAGNOSIS — D631 Anemia in chronic kidney disease: Secondary | ICD-10-CM | POA: Diagnosis not present

## 2018-12-23 NOTE — Progress Notes (Signed)
Daily Session Note  Patient Details  Name: Jeremy [name] MRN: [id number] Date of Birth: Sep 15, 1955 Referring Provider:     Cardiac Rehab from 11/09/2018 in [facility] Cardiac and Pulmonary Rehab  Referring Provider  [name]      Encounter Date: 12/23/2018  Check In: Session Check In - 12/23/18 0804      Check-In   Supervising physician immediately available to respond to emergencies  See telemetry face sheet for immediately available ER MD    Location  [facility]-Cardiac & Pulmonary Rehab    Staff Present  Vida Rigger RN, Vickki Hearing, BA, ACSM CEP, Exercise Physiologist;Jessica Luan [place], MA, RCEP, CCRP, CCET    Virtual Visit  No    Medication changes reported      No    Fall or balance concerns reported     No    Warm-up and Cool-down  Performed on first and last piece of equipment    Resistance Training Performed  Yes    VAD Patient?  No    PAD/SET Patient?  No      Pain Assessment   Currently in Pain?  No/denies    Multiple Pain Sites  No          Social History   Tobacco Use  Smoking Status Never Smoker  Smokeless Tobacco Never Used    Goals Met:  Independence with exercise equipment Exercise tolerated well No report of cardiac concerns or symptoms Strength training completed today  Goals Unmet:  Not Applicable  Comments: Pt able to follow exercise prescription today without complaint.  Will continue to monitor for progression.   Dr. Emily Filbert is Medical Director for [place] and [facility].

## 2018-12-24 DIAGNOSIS — D631 Anemia in chronic kidney disease: Secondary | ICD-10-CM | POA: Diagnosis not present

## 2018-12-24 DIAGNOSIS — Z992 Dependence on renal dialysis: Secondary | ICD-10-CM | POA: Diagnosis not present

## 2018-12-24 DIAGNOSIS — N2581 Secondary hyperparathyroidism of renal origin: Secondary | ICD-10-CM | POA: Diagnosis not present

## 2018-12-24 DIAGNOSIS — Z23 Encounter for immunization: Secondary | ICD-10-CM | POA: Diagnosis not present

## 2018-12-24 DIAGNOSIS — N186 End stage renal disease: Secondary | ICD-10-CM | POA: Diagnosis not present

## 2018-12-27 ENCOUNTER — Ambulatory Visit (INDEPENDENT_AMBULATORY_CARE_PROVIDER_SITE_OTHER): Payer: Medicare Other

## 2018-12-27 ENCOUNTER — Telehealth: Payer: Self-pay | Admitting: Cardiovascular Disease

## 2018-12-27 DIAGNOSIS — N186 End stage renal disease: Secondary | ICD-10-CM | POA: Diagnosis not present

## 2018-12-27 DIAGNOSIS — Z5181 Encounter for therapeutic drug level monitoring: Secondary | ICD-10-CM

## 2018-12-27 DIAGNOSIS — D631 Anemia in chronic kidney disease: Secondary | ICD-10-CM | POA: Diagnosis not present

## 2018-12-27 DIAGNOSIS — I4821 Permanent atrial fibrillation: Secondary | ICD-10-CM

## 2018-12-27 DIAGNOSIS — Z23 Encounter for immunization: Secondary | ICD-10-CM | POA: Diagnosis not present

## 2018-12-27 DIAGNOSIS — Z952 Presence of prosthetic heart valve: Secondary | ICD-10-CM

## 2018-12-27 DIAGNOSIS — N2581 Secondary hyperparathyroidism of renal origin: Secondary | ICD-10-CM | POA: Diagnosis not present

## 2018-12-27 DIAGNOSIS — Z992 Dependence on renal dialysis: Secondary | ICD-10-CM | POA: Diagnosis not present

## 2018-12-27 LAB — POCT INR: INR: 2.9 (ref 2.0–3.0)

## 2018-12-27 NOTE — Patient Instructions (Signed)
Spoke with pt and instructed him to continue dosage of 1 TABLET EVERY DAY EXCEPT 1/2 TABLET ON MONDAYS, [place].  Recheck INR in 1 week - call [phone] w/ results.

## 2018-12-27 NOTE — Telephone Encounter (Signed)
Please see anti-coag note for today. 

## 2018-12-27 NOTE — Telephone Encounter (Signed)
Patient calling INR result: 2.9 Patient states he did get a cortisone shot which may have affected result

## 2018-12-28 ENCOUNTER — Encounter: Payer: Self-pay | Admitting: Cardiovascular Disease

## 2018-12-28 ENCOUNTER — Other Ambulatory Visit: Payer: Self-pay

## 2018-12-28 ENCOUNTER — Encounter: Payer: Medicare Other | Admitting: *Deleted

## 2018-12-28 DIAGNOSIS — I5020 Unspecified systolic (congestive) heart failure: Secondary | ICD-10-CM | POA: Diagnosis not present

## 2018-12-28 DIAGNOSIS — Z951 Presence of aortocoronary bypass graft: Secondary | ICD-10-CM

## 2018-12-28 DIAGNOSIS — I2510 Atherosclerotic heart disease of native coronary artery without angina pectoris: Secondary | ICD-10-CM | POA: Diagnosis not present

## 2018-12-28 DIAGNOSIS — I120 Hypertensive chronic kidney disease with stage 5 chronic kidney disease or end stage renal disease: Secondary | ICD-10-CM | POA: Diagnosis not present

## 2018-12-28 DIAGNOSIS — D631 Anemia in chronic kidney disease: Secondary | ICD-10-CM | POA: Diagnosis not present

## 2018-12-28 DIAGNOSIS — E785 Hyperlipidemia, unspecified: Secondary | ICD-10-CM | POA: Diagnosis not present

## 2018-12-28 NOTE — Progress Notes (Signed)
Daily Session Note  Patient Details  Name: Jeremy [name] MRN: [id number] Date of Birth: 10-Oct-1955 Referring Provider:     Cardiac Rehab from 11/09/2018 in [facility] Cardiac and Pulmonary Rehab  Referring Provider  [name]      Encounter Date: 12/28/2018  Check In: Session Check In - 12/28/18 0813      Check-In   Supervising physician immediately available to respond to emergencies  See telemetry face sheet for immediately available ER MD    Location  [facility]-Cardiac & Pulmonary Rehab    Staff Present  Heath Lark, RN, BSN, CCRP;Joseph Foy [place], [place], ACSM CEP, Exercise Physiologist    Virtual Visit  No    Medication changes reported      No    Fall or balance concerns reported     No    Warm-up and Cool-down  Performed on first and last piece of equipment    Resistance Training Performed  Yes    VAD Patient?  No    PAD/SET Patient?  No      Pain Assessment   Currently in Pain?  No/denies          Social History   Tobacco Use  Smoking Status Never Smoker  Smokeless Tobacco Never Used    Goals Met:  Independence with exercise equipment Exercise tolerated well No report of cardiac concerns or symptoms  Goals Unmet:  Not Applicable  Comments: Pt able to follow exercise prescription today without complaint.  Will continue to monitor for progression.    Dr. Emily Filbert is Medical Director for [place] and [facility].

## 2018-12-29 DIAGNOSIS — Z23 Encounter for immunization: Secondary | ICD-10-CM | POA: Diagnosis not present

## 2018-12-29 DIAGNOSIS — N2581 Secondary hyperparathyroidism of renal origin: Secondary | ICD-10-CM | POA: Diagnosis not present

## 2018-12-29 DIAGNOSIS — N186 End stage renal disease: Secondary | ICD-10-CM | POA: Diagnosis not present

## 2018-12-29 DIAGNOSIS — Z992 Dependence on renal dialysis: Secondary | ICD-10-CM | POA: Diagnosis not present

## 2018-12-29 DIAGNOSIS — D631 Anemia in chronic kidney disease: Secondary | ICD-10-CM | POA: Diagnosis not present

## 2018-12-30 ENCOUNTER — Encounter: Payer: Medicare Other | Admitting: *Deleted

## 2018-12-30 ENCOUNTER — Other Ambulatory Visit: Payer: Self-pay

## 2018-12-30 DIAGNOSIS — D631 Anemia in chronic kidney disease: Secondary | ICD-10-CM | POA: Diagnosis not present

## 2018-12-30 DIAGNOSIS — I2510 Atherosclerotic heart disease of native coronary artery without angina pectoris: Secondary | ICD-10-CM | POA: Diagnosis not present

## 2018-12-30 DIAGNOSIS — Z951 Presence of aortocoronary bypass graft: Secondary | ICD-10-CM

## 2018-12-30 DIAGNOSIS — E785 Hyperlipidemia, unspecified: Secondary | ICD-10-CM | POA: Diagnosis not present

## 2018-12-30 DIAGNOSIS — I5020 Unspecified systolic (congestive) heart failure: Secondary | ICD-10-CM | POA: Diagnosis not present

## 2018-12-30 DIAGNOSIS — I120 Hypertensive chronic kidney disease with stage 5 chronic kidney disease or end stage renal disease: Secondary | ICD-10-CM | POA: Diagnosis not present

## 2018-12-30 NOTE — Progress Notes (Signed)
Daily Session Note  Patient Details  Name: Jeremy [name] MRN: [id number] Date of Birth: 1956-02-07 Referring Provider:     Cardiac Rehab from 11/09/2018 in [facility] Cardiac and Pulmonary Rehab  Referring Provider  [name]      Encounter Date: 12/30/2018  Check In: Session Check In - 12/30/18 0802      Check-In   Supervising physician immediately available to respond to emergencies  See telemetry face sheet for immediately available ER MD    Location  [facility]-Cardiac & Pulmonary Rehab    Staff Present  Gerlene Burdock, RN, BSN-BC, CCRP;Amanda Sommer, BA, ACSM CEP, Exercise Physiologist;Kameela Leipold [place], MA, RCEP, CCRP, CCET    Virtual Visit  No    Medication changes reported      No    Fall or balance concerns reported     No    Warm-up and Cool-down  Performed on first and last piece of equipment    Resistance Training Performed  Yes    VAD Patient?  No    PAD/SET Patient?  No      Pain Assessment   Currently in Pain?  No/denies          Social History   Tobacco Use  Smoking Status Never Smoker  Smokeless Tobacco Never Used    Goals Met:  Independence with exercise equipment Exercise tolerated well No report of cardiac concerns or symptoms Strength training completed today  Goals Unmet:  Not Applicable  Comments: Pt able to follow exercise prescription today without complaint.  Will continue to monitor for progression.    Dr. Emily Filbert is Medical Director for [place] and [facility].

## 2018-12-31 DIAGNOSIS — N2581 Secondary hyperparathyroidism of renal origin: Secondary | ICD-10-CM | POA: Diagnosis not present

## 2018-12-31 DIAGNOSIS — Z992 Dependence on renal dialysis: Secondary | ICD-10-CM | POA: Diagnosis not present

## 2018-12-31 DIAGNOSIS — N186 End stage renal disease: Secondary | ICD-10-CM | POA: Diagnosis not present

## 2018-12-31 DIAGNOSIS — Z23 Encounter for immunization: Secondary | ICD-10-CM | POA: Diagnosis not present

## 2018-12-31 DIAGNOSIS — D631 Anemia in chronic kidney disease: Secondary | ICD-10-CM | POA: Diagnosis not present

## 2019-01-03 ENCOUNTER — Encounter: Payer: Self-pay | Admitting: Cardiovascular Disease

## 2019-01-03 ENCOUNTER — Ambulatory Visit (INDEPENDENT_AMBULATORY_CARE_PROVIDER_SITE_OTHER): Payer: Medicare Other

## 2019-01-03 ENCOUNTER — Telehealth: Payer: Self-pay | Admitting: Cardiovascular Disease

## 2019-01-03 DIAGNOSIS — N2581 Secondary hyperparathyroidism of renal origin: Secondary | ICD-10-CM | POA: Diagnosis not present

## 2019-01-03 DIAGNOSIS — N186 End stage renal disease: Secondary | ICD-10-CM | POA: Diagnosis not present

## 2019-01-03 DIAGNOSIS — Z952 Presence of prosthetic heart valve: Secondary | ICD-10-CM

## 2019-01-03 DIAGNOSIS — Z5181 Encounter for therapeutic drug level monitoring: Secondary | ICD-10-CM

## 2019-01-03 DIAGNOSIS — Z992 Dependence on renal dialysis: Secondary | ICD-10-CM | POA: Diagnosis not present

## 2019-01-03 DIAGNOSIS — D631 Anemia in chronic kidney disease: Secondary | ICD-10-CM | POA: Diagnosis not present

## 2019-01-03 DIAGNOSIS — I4821 Permanent atrial fibrillation: Secondary | ICD-10-CM

## 2019-01-03 DIAGNOSIS — Z23 Encounter for immunization: Secondary | ICD-10-CM | POA: Diagnosis not present

## 2019-01-03 LAB — POCT INR: INR: 2.3 (ref 2.0–3.0)

## 2019-01-03 LAB — LAB REPORT - SCANNED: INR: 2.3

## 2019-01-03 NOTE — Patient Instructions (Signed)
Spoke with pt and instructed him to take a whole tablet tonight, then continue dosage of 1 TABLET EVERY DAY EXCEPT 1/2 TABLET ON MONDAYS, [place].  Recheck INR in 1 week - call [phone] w/ results.

## 2019-01-03 NOTE — Telephone Encounter (Signed)
Please see anti-coag note for today. 

## 2019-01-03 NOTE — Telephone Encounter (Signed)
Patient self test inr is 2.3 today.

## 2019-01-04 ENCOUNTER — Other Ambulatory Visit: Payer: Self-pay

## 2019-01-04 ENCOUNTER — Encounter: Payer: Medicare Other | Admitting: *Deleted

## 2019-01-04 DIAGNOSIS — I120 Hypertensive chronic kidney disease with stage 5 chronic kidney disease or end stage renal disease: Secondary | ICD-10-CM | POA: Diagnosis not present

## 2019-01-04 DIAGNOSIS — E785 Hyperlipidemia, unspecified: Secondary | ICD-10-CM | POA: Diagnosis not present

## 2019-01-04 DIAGNOSIS — I5020 Unspecified systolic (congestive) heart failure: Secondary | ICD-10-CM | POA: Diagnosis not present

## 2019-01-04 DIAGNOSIS — Z951 Presence of aortocoronary bypass graft: Secondary | ICD-10-CM

## 2019-01-04 DIAGNOSIS — I2510 Atherosclerotic heart disease of native coronary artery without angina pectoris: Secondary | ICD-10-CM | POA: Diagnosis not present

## 2019-01-04 DIAGNOSIS — D631 Anemia in chronic kidney disease: Secondary | ICD-10-CM | POA: Diagnosis not present

## 2019-01-04 NOTE — Progress Notes (Signed)
Daily Session Note  Patient Details  Name: Jeremy [name] MRN: [id number] Date of Birth: 07/20/1955 Referring Provider:     Cardiac Rehab from 11/09/2018 in [facility] Cardiac and Pulmonary Rehab  Referring Provider  [name]      Encounter Date: 01/04/2019  Check In: Session Check In - 01/04/19 0800      Check-In   Supervising physician immediately available to respond to emergencies  See telemetry face sheet for immediately available ER MD    Staff Present  Nada Maclachlan, BA, ACSM CEP, Exercise Physiologist;Krista Frederico Hamman, RN BSN;Joseph Hood RCP,RRT,BSRT    Virtual Visit  No    Medication changes reported      No    Fall or balance concerns reported     No    Tobacco Cessation  No Change    Warm-up and Cool-down  Performed on first and last piece of equipment    Resistance Training Performed  Yes    VAD Patient?  No    PAD/SET Patient?  No      Pain Assessment   Currently in Pain?  No/denies          Social History   Tobacco Use  Smoking Status Never Smoker  Smokeless Tobacco Never Used    Goals Met:  Independence with exercise equipment Exercise tolerated well No report of cardiac concerns or symptoms Strength training completed today  Goals Unmet:  Not Applicable  Comments: Pt able to follow exercise prescription today without complaint.  Will continue to monitor for progression.    Dr. Emily Filbert is Medical Director for [place] and [facility].

## 2019-01-05 DIAGNOSIS — T8610 Unspecified complication of kidney transplant: Secondary | ICD-10-CM | POA: Diagnosis not present

## 2019-01-05 DIAGNOSIS — N186 End stage renal disease: Secondary | ICD-10-CM | POA: Diagnosis not present

## 2019-01-05 DIAGNOSIS — Z23 Encounter for immunization: Secondary | ICD-10-CM | POA: Diagnosis not present

## 2019-01-05 DIAGNOSIS — N2581 Secondary hyperparathyroidism of renal origin: Secondary | ICD-10-CM | POA: Diagnosis not present

## 2019-01-05 DIAGNOSIS — Z992 Dependence on renal dialysis: Secondary | ICD-10-CM | POA: Diagnosis not present

## 2019-01-05 DIAGNOSIS — D631 Anemia in chronic kidney disease: Secondary | ICD-10-CM | POA: Diagnosis not present

## 2019-01-06 ENCOUNTER — Other Ambulatory Visit: Payer: Self-pay

## 2019-01-06 ENCOUNTER — Encounter: Payer: Medicare Other | Attending: Cardiovascular Disease | Admitting: *Deleted

## 2019-01-06 ENCOUNTER — Encounter: Payer: Self-pay | Admitting: Cardiovascular Disease

## 2019-01-06 DIAGNOSIS — Z7982 Long term (current) use of aspirin: Secondary | ICD-10-CM | POA: Diagnosis not present

## 2019-01-06 DIAGNOSIS — I2510 Atherosclerotic heart disease of native coronary artery without angina pectoris: Secondary | ICD-10-CM | POA: Insufficient documentation

## 2019-01-06 DIAGNOSIS — Z951 Presence of aortocoronary bypass graft: Secondary | ICD-10-CM

## 2019-01-06 DIAGNOSIS — I120 Hypertensive chronic kidney disease with stage 5 chronic kidney disease or end stage renal disease: Secondary | ICD-10-CM | POA: Diagnosis not present

## 2019-01-06 DIAGNOSIS — I255 Ischemic cardiomyopathy: Secondary | ICD-10-CM | POA: Diagnosis not present

## 2019-01-06 DIAGNOSIS — Z7901 Long term (current) use of anticoagulants: Secondary | ICD-10-CM | POA: Diagnosis not present

## 2019-01-06 DIAGNOSIS — E785 Hyperlipidemia, unspecified: Secondary | ICD-10-CM | POA: Diagnosis not present

## 2019-01-06 DIAGNOSIS — M1990 Unspecified osteoarthritis, unspecified site: Secondary | ICD-10-CM | POA: Insufficient documentation

## 2019-01-06 DIAGNOSIS — I5020 Unspecified systolic (congestive) heart failure: Secondary | ICD-10-CM | POA: Insufficient documentation

## 2019-01-06 DIAGNOSIS — D631 Anemia in chronic kidney disease: Secondary | ICD-10-CM | POA: Diagnosis not present

## 2019-01-06 DIAGNOSIS — Z79899 Other long term (current) drug therapy: Secondary | ICD-10-CM | POA: Insufficient documentation

## 2019-01-06 DIAGNOSIS — N186 End stage renal disease: Secondary | ICD-10-CM | POA: Insufficient documentation

## 2019-01-06 NOTE — Progress Notes (Signed)
Daily Session Note  Patient Details  Name: Jeremy [name] MRN: [id number] Date of Birth: 07/09/1955 Referring Provider:     Cardiac Rehab from 11/09/2018 in [facility] Cardiac and Pulmonary Rehab  Referring Provider  [name]      Encounter Date: 01/06/2019  Check In: Session Check In - 01/06/19 0757      Check-In   Supervising physician immediately available to respond to emergencies  See telemetry face sheet for immediately available ER MD    Location  [facility]-Cardiac & Pulmonary Rehab    Staff Present  Meredith Craven, RN BSN;Jessica Hawkins, MA, RCEP, CCRP, CCET;Amanda Sommer, BA, ACSM CEP, Exercise Physiologist    Virtual Visit  No    Medication changes reported      No    Fall or balance concerns reported     No    Warm-up and Cool-down  Performed on first and last piece of equipment    Resistance Training Performed  Yes    VAD Patient?  No    PAD/SET Patient?  No      Pain Assessment   Currently in Pain?  No/denies          Social History   Tobacco Use  Smoking Status Never Smoker  Smokeless Tobacco Never Used    Goals Met:  Independence with exercise equipment Exercise tolerated well No report of cardiac concerns or symptoms Strength training completed today  Goals Unmet:  Not Applicable  Comments: Pt able to follow exercise prescription today without complaint.  Will continue to monitor for progression.    Dr. Mark Miller is Medical Director for [facility] and [facility]. 

## 2019-01-07 DIAGNOSIS — N186 End stage renal disease: Secondary | ICD-10-CM | POA: Diagnosis not present

## 2019-01-07 DIAGNOSIS — N2581 Secondary hyperparathyroidism of renal origin: Secondary | ICD-10-CM | POA: Diagnosis not present

## 2019-01-07 DIAGNOSIS — Z992 Dependence on renal dialysis: Secondary | ICD-10-CM | POA: Diagnosis not present

## 2019-01-07 DIAGNOSIS — D631 Anemia in chronic kidney disease: Secondary | ICD-10-CM | POA: Diagnosis not present

## 2019-01-10 DIAGNOSIS — Z992 Dependence on renal dialysis: Secondary | ICD-10-CM | POA: Diagnosis not present

## 2019-01-10 DIAGNOSIS — D631 Anemia in chronic kidney disease: Secondary | ICD-10-CM | POA: Diagnosis not present

## 2019-01-10 DIAGNOSIS — N186 End stage renal disease: Secondary | ICD-10-CM | POA: Diagnosis not present

## 2019-01-10 DIAGNOSIS — N2581 Secondary hyperparathyroidism of renal origin: Secondary | ICD-10-CM | POA: Diagnosis not present

## 2019-01-10 LAB — POCT INR: INR: 3 (ref 2.0–3.0)

## 2019-01-12 ENCOUNTER — Telehealth: Payer: Self-pay | Admitting: Cardiovascular Disease

## 2019-01-12 ENCOUNTER — Ambulatory Visit (INDEPENDENT_AMBULATORY_CARE_PROVIDER_SITE_OTHER): Payer: Medicare Other

## 2019-01-12 ENCOUNTER — Encounter: Payer: Self-pay | Admitting: *Deleted

## 2019-01-12 DIAGNOSIS — Z5181 Encounter for therapeutic drug level monitoring: Secondary | ICD-10-CM | POA: Diagnosis not present

## 2019-01-12 DIAGNOSIS — Z952 Presence of prosthetic heart valve: Secondary | ICD-10-CM

## 2019-01-12 DIAGNOSIS — I4821 Permanent atrial fibrillation: Secondary | ICD-10-CM

## 2019-01-12 DIAGNOSIS — Z951 Presence of aortocoronary bypass graft: Secondary | ICD-10-CM

## 2019-01-12 DIAGNOSIS — N2581 Secondary hyperparathyroidism of renal origin: Secondary | ICD-10-CM | POA: Diagnosis not present

## 2019-01-12 DIAGNOSIS — D631 Anemia in chronic kidney disease: Secondary | ICD-10-CM | POA: Diagnosis not present

## 2019-01-12 DIAGNOSIS — Z992 Dependence on renal dialysis: Secondary | ICD-10-CM | POA: Diagnosis not present

## 2019-01-12 DIAGNOSIS — N186 End stage renal disease: Secondary | ICD-10-CM | POA: Diagnosis not present

## 2019-01-12 LAB — LAB CBC W/AUTO DIFF (IA) (85025): INR: 3

## 2019-01-12 LAB — LAB REPORT - SCANNED: INR: 3

## 2019-01-12 NOTE — Telephone Encounter (Signed)
Please see anti-coag note for today. 

## 2019-01-12 NOTE — Progress Notes (Signed)
Cardiac Individual Treatment Plan  Patient Details  Name: Jeremy [name] MRN: [id number] Date of Birth: 1955/06/27 Referring Provider:     Cardiac Rehab from 11/09/2018 in [facility] Cardiac and Pulmonary Rehab  Referring Provider  [name]      Initial Encounter Date:    Cardiac Rehab from 11/09/2018 in [facility] Cardiac and Pulmonary Rehab  Date  11/09/18      Visit Diagnosis: S/P CABG x 3  Patient's Home Medications on Admission:  Current Outpatient Medications:  .  acetaminophen (TYLENOL) 500 MG tablet, Take 1,000 mg 2 (two) times daily as needed by mouth for moderate pain., Disp: , Rfl:  .  allopurinol (ZYLOPRIM) 300 MG tablet, Take 300 mg by mouth daily., Disp: , Rfl: 5 .  amiodarone (PACERONE) 200 MG tablet, Take 1 tablet (200 mg total) by mouth daily., Disp: 180 tablet, Rfl: 3 .  aspirin EC 81 MG EC tablet, Take 1 tablet (81 mg total) by mouth daily., Disp:  , Rfl:  .  AURYXIA 1 GM 210 MG(Fe) tablet, TAKE 2 TABLETS BY MOUTH THREE TIMES A DAY WITH MEALS, Disp: , Rfl:  .  cinacalcet (SENSIPAR) 30 MG tablet, Take 30 mg by mouth every other day. , Disp: , Rfl:  .  Darbepoetin Alfa (ARANESP) 100 MCG/0.5ML SOSY injection, Inject 0.5 mLs (100 mcg total) into the vein every Monday with hemodialysis., Disp: 4.2 mL, Rfl:  .  Dextrose-Fructose-Sod Citrate (NAUZENE PO), Take 1-2 tablets 2 (two) times daily as needed by mouth (nausea)., Disp: , Rfl:  .  folic acid-vitamin b complex-vitamin c-selenium-zinc (DIALYVITE) 3 MG TABS tablet, Take 1 tablet by mouth daily., Disp: , Rfl:  .  lidocaine-prilocaine (EMLA) cream, Apply 1 application topically as needed (for fistula)., Disp: , Rfl:  .  metoprolol tartrate (LOPRESSOR) 25 MG tablet, TAKE 1/2 TABLETS (12.5 MG TOTAL) BY MOUTH 2 (TWO) TIMES DAILY., Disp: 30 tablet, Rfl: 1 .  midodrine (PROAMATINE) 10 MG tablet, Take 10 mg by mouth 2 (two) times daily as needed (for low BP)., Disp: , Rfl:  .  omeprazole (PRILOSEC) 20 MG capsule, Take 20 mg by mouth daily., Disp:  , Rfl:  .  pravastatin (PRAVACHOL) 20 MG tablet, Take 1 tablet (20 mg total) by mouth every evening., Disp: 90 tablet, Rfl: 3 .  predniSONE (DELTASONE) 5 MG tablet, Take 5 mg by mouth daily with breakfast. , Disp: , Rfl:  .  sevelamer carbonate (RENVELA) 800 MG tablet, Take 40,000 mg by mouth 3 (three) times daily with meals. 5 tabs per meal, Disp: , Rfl:  .  warfarin (COUMADIN) 1 MG tablet, Take 1 tablet (1 mg total) by mouth daily at 6 PM. (Patient taking differently: Take 1-2 mg by mouth See admin instructions. 1 mg all days , except 2 mg on Sunday..* OR AS DIRECTED), Disp: 90 tablet, Rfl: 1  Past Medical History: Past Medical History:  Diagnosis Date  . 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 Aortic stenosis    a. 2014 s/p mech AVR ([facility])-->chronic coumadin; b. 08/2018 Echo: Mild AS/AI. Nl fxn/gradient.  . Arthritis   . Atrial fibrillation ([name])   . CAD (coronary artery disease)    a. 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 02/2018 MV ([facility]: large inf, infsept, inflat infarct w/ min rev, EF 30%; c. 06/2018 Cath: LM min irregs, LAD 60p, 95/30/62m, LCX min irregs, RCA 40p, 75m90d, RPAV 90, RPDA 95ost; c.  09/2018 CABGx3 (LIMA->LAD, VG->D1, VG->PDA).  . Chronic anticoagulation 06/2012   a. Coumadin in the setting of mech AVR and afib.  Marland Kitchen CPAP (continuous positive airway pressure) dependence   . Dialysis patient Jeremy [name]) 04/07/2006   Patient started HD around 1996,  had 1st transplant LLQ around 1998 until 2007 when they found renal cell cancer in transplant and both native kidneys, all were removed > went back on HD until 2nd transplant RLQ in 2014 which lasted 3 yrs; it was embolized for suspected acute rejection. Is currently on HD MWF in [name] w/ [facility] nephrology.  . ESRD (end stage renal disease) ([name])    a. 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 1999 s/p Kidney transplant; c. 2014 Bilat nephrectomy (transplanted  kidney resected) in the setting of renal cell carcinoma; d. PD until 11/2017-->HD.  Marland Kitchen Heart murmur   . HFrEF (heart failure with reduced ejection fraction) ([name])    a. 07/2016 Echo: EF 55-60%; b. 02/2018 MV: EF 30%; c. 04/2018 Echo: EF 35%; d. 08/2018 Echo: EF 30-35%. Glob HK w/o rwma. Mildly reduced RV fxn. Mod to sev dil LA. Nl AoV fxn.  Marland Kitchen Hx of colonoscopy    2009 by Dr. Laural Golden. Normal except for small submucosal lipoma. No evidence of polyps or colitis.  . Hyperlipidemia   . Hypertension   . Hypogonadism in male 07/25/2015  . Ischemic cardiomyopathy    a. 07/2016 Echo: EF 55-60%; b. 02/2018 MV: EF 30%; c. 04/2018 Echo: EF 35%; d. 08/2018 Echo: EF 30-35%.  . OSA (obstructive sleep apnea)    No CPAP  . Permanent atrial fibrillation    a. CHA2DS2VASc = 3-->Coumadin (also mech AVR); b. 06/2018 Amio started 2/2 elevated rates.  . Renal cell cancer ([name])   . Renal cell carcinoma ([name]) 08/04/2016    Tobacco Use: Social History   Tobacco Use  Smoking Status Never Smoker  Smokeless Tobacco Never Used    Labs: Recent Review Flowsheet Data    Labs for ITP Cardiac and Pulmonary Rehab Latest Ref Rng & Units 09/16/2018 09/16/2018 09/16/2018 09/16/2018 09/16/2018   Cholestrol 0 - 200 mg/dL - - - - -   LDLCALC 0 - 99 mg/dL - - - - -   LDLDIRECT mg/dL - - - - -   HDL >39.00 mg/dL - - - - -   Trlycerides 0.0 - 149.0 mg/dL - - - - -   Hemoglobin A1c 4.8 - 5.6 % - - - - -   PHART 7.350 - 7.450 7.393 7.390 7.375 7.329(L) -   PCO2ART 32.0 - 48.0 mmHg 45.2 43.1 44.0 48.1(H) -   HCO3 20.0 - 28.0 mmol/L 27.6 26.3 25.7 25.2 -   TCO2 22 - 32 mmol/L '29 28 27 27 24   ' ACIDBASEDEF 0.0 - 2.0 mmol/L - - - 1.0 -   O2SAT % 98.0 98.0 99.0 99.0 -       Exercise Target Goals: Exercise Program Goal: Individual exercise prescription set using results from initial 6 min walk test and THRR while considering  patient's activity barriers and safety.   Exercise Prescription Goal: Initial exercise prescription builds to  30-45 minutes a day of aerobic activity, 2-3 days per week.  Home exercise guidelines will be given to patient during program as part of exercise prescription that the participant will acknowledge.  Activity Barriers & Risk Stratification: Activity Barriers & Cardiac Risk Stratification - 11/04/18 1054      Activity Barriers & Cardiac Risk Stratification  Activity Barriers  Joint Problems;Muscular Weakness    Cardiac Risk Stratification  High       6 Minute Walk: 6 Minute Walk    Row Name 11/09/18 1543         6 Minute Walk   Distance  1288 feet     Walk Time  6 minutes     # of Rest Breaks  0     MPH  2.44     METS  3.6     RPE  11     VO2 Peak  12.6     Symptoms  No     Resting HR  74 bpm     Resting BP  122/60     Max Ex. HR  116 bpm     Max Ex. BP  134/62     2 Minute Post BP  112/62        Oxygen Initial Assessment:   Oxygen Re-Evaluation:   Oxygen Discharge (Final Oxygen Re-Evaluation):   Initial Exercise Prescription: Initial Exercise Prescription - 11/09/18 1500      Date of Initial Exercise RX and Referring Provider   Date  11/09/18    Referring Provider  [name]      Treadmill   MPH  2.5    Grade  2    Minutes  15    METs  3.6      NuStep   Level  4    SPM  80    Minutes  15    METs  3.6      REL-XR   Level  3    Speed  50    Minutes  15    METs  3.6      Prescription Details   Frequency (times per week)  2    Duration  Progress to 30 minutes of continuous aerobic without signs/symptoms of physical distress      Intensity   THRR 40-80% of Max Heartrate  115-144    Ratings of Perceived Exertion  11-15    Perceived Dyspnea  0-4      Progression   Progression  Continue to progress workloads to maintain intensity without signs/symptoms of physical distress.      Resistance Training   Training Prescription  Yes    Weight  3 lb    Reps  10-15       Perform Capillary Blood Glucose checks as needed.  Exercise Prescription  Changes: Exercise Prescription Changes    Row Name 11/10/18 1200 11/18/18 0800 11/26/18 0800 11/30/18 1500 12/15/18 1100     Response to Exercise   Blood Pressure (Admit)  122/60  -  138/68  132/60  114/58   Blood Pressure (Exercise)  134/62  -  150/80  138/68  118/60   Blood Pressure (Exit)  112/62  -  104/60  114/60  110/56   Heart Rate (Admit)  74 bpm  -  76 bpm  83 bpm  90 bpm   Heart Rate (Exercise)  116 bpm  -  111 bpm  115 bpm  111 bpm   Heart Rate (Exit)  83 bpm  -  89 bpm  89 bpm  89 bpm   Rating of Perceived Exertion (Exercise)  11  -  '13  13  14   ' Symptoms  none  -  none  none  none   Duration  -  -  Progress to 30 minutes of  aerobic without signs/symptoms of physical  distress  Continue with 30 min of aerobic exercise without signs/symptoms of physical distress.  Continue with 30 min of aerobic exercise without signs/symptoms of physical distress.   Intensity  -  -  THRR unchanged  THRR unchanged  THRR unchanged     Progression   Progression  -  -  Continue to progress workloads to maintain intensity without signs/symptoms of physical distress.  Continue to progress workloads to maintain intensity without signs/symptoms of physical distress.  Continue to progress workloads to maintain intensity without signs/symptoms of physical distress.   Average METs  -  -  2.9  2.8  2.8     Resistance Training   Training Prescription  -  -  Yes  Yes  Yes   Weight  -  -  '4  4  4    ' Reps  -  -  10-15  10-15  10-15     Interval Training   Interval Training  -  -  No  -  No     Treadmill   MPH  -  -  2.5  -  -   Grade  -  -  2  -  -   Minutes  -  -  15  -  -   METs  -  -  3.6  -  -     NuStep   Level  -  -  '4  5  5   ' SPM  -  -  50  50  50   Minutes  -  -  '15  15  15   ' METs  -  -  2.8  3.6  3.6     REL-XR   Level  -  -  1.'5  2  2   ' Speed  -  -  50  50  50   Minutes  -  -  '15  15  15   ' METs  -  -  1.9  1.9  1.9     Home Exercise Plan   Plans to continue exercise at  -  Home  (comment) walking  Home (comment) walking  Home (comment) walking  Home (comment) walking   Frequency  -  Add 2 additional days to program exercise sessions.  Add 2 additional days to program exercise sessions.  Add 2 additional days to program exercise sessions.  Add 2 additional days to program exercise sessions.   Initial Home Exercises Provided  -  11/18/18  11/18/18  11/18/18  11/18/18      Exercise Comments: Exercise Comments    Row Name 11/11/18 0840 11/16/18 0828 12/09/18 0912       Exercise Comments  First full day of exercise!  Patient was oriented to gym and equipment including functions, settings, policies, and procedures.  Patient's individual exercise prescription and treatment plan were reviewed.  All starting workloads were established based on the results of the 6 minute walk test done at initial orientation visit.  The plan for exercise progression was also introduced and progression will be customized based on patient's performance and goals.  Tanner stated that after he went home last week his chest was very sore.  Remains sore today. He was advised to decrease or not use weights today.  Also to call his doctor if pain continues  Rober had lower BP today after dialysis yesterday        Exercise Goals and Review: Exercise Goals    Row Name  11/09/18 1548             Exercise Goals   Increase Physical Activity  Yes       Expected Outcomes  Short Term: Attend rehab on a regular basis to increase amount of physical activity.;Long Term: Add in home exercise to make exercise part of routine and to increase amount of physical activity.;Long Term: Exercising regularly at least 3-5 days a week.       Increase Strength and Stamina  Yes       Intervention  Provide advice, education, support and counseling about physical activity/exercise needs.;Develop an individualized exercise prescription for aerobic and resistive training based on initial evaluation findings, risk stratification,  comorbidities and participant's personal goals.       Expected Outcomes  Short Term: Increase workloads from initial exercise prescription for resistance, speed, and METs.;Short Term: Perform resistance training exercises routinely during rehab and add in resistance training at home;Long Term: Improve cardiorespiratory fitness, muscular endurance and strength as measured by increased METs and functional capacity (6MWT)       Able to understand and use rate of perceived exertion (RPE) scale  Yes       Intervention  Provide education and explanation on how to use RPE scale       Expected Outcomes  Short Term: Able to use RPE daily in rehab to express subjective intensity level;Long Term:  Able to use RPE to guide intensity level when exercising independently       Able to understand and use Dyspnea scale  Yes       Intervention  Provide education and explanation on how to use Dyspnea scale       Expected Outcomes  Short Term: Able to use Dyspnea scale daily in rehab to express subjective sense of shortness of breath during exertion;Long Term: Able to use Dyspnea scale to guide intensity level when exercising independently       Knowledge and understanding of Target Heart Rate Range (THRR)  Yes       Intervention  Provide education and explanation of THRR including how the numbers were predicted and where they are located for reference       Expected Outcomes  Short Term: Able to state/look up THRR;Short Term: Able to use daily as guideline for intensity in rehab;Long Term: Able to use THRR to govern intensity when exercising independently       Able to check pulse independently  Yes       Intervention  Provide education and demonstration on how to check pulse in carotid and radial arteries.;Review the importance of being able to check your own pulse for safety during independent exercise       Expected Outcomes  Short Term: Able to explain why pulse checking is important during independent exercise;Long  Term: Able to check pulse independently and accurately       Understanding of Exercise Prescription  Yes       Intervention  Provide education, explanation, and written materials on patient's individual exercise prescription       Expected Outcomes  Short Term: Able to explain program exercise prescription;Long Term: Able to explain home exercise prescription to exercise independently          Exercise Goals Re-Evaluation : Exercise Goals Re-Evaluation    Row Name 11/11/18 0840 11/18/18 0819 11/26/18 0816 11/30/18 1544 12/15/18 1120     Exercise Goal Re-Evaluation   Exercise Goals Review  Increase Physical Activity;Increase Strength and Stamina;Able to understand  and use rate of perceived exertion (RPE) scale;Knowledge and understanding of Target Heart Rate Range (THRR);Understanding of Exercise Prescription  Increase Physical Activity;Increase Strength and Stamina;Able to understand and use rate of perceived exertion (RPE) scale;Knowledge and understanding of Target Heart Rate Range (THRR);Understanding of Exercise Prescription;Able to understand and use Dyspnea scale;Able to check pulse independently  Increase Physical Activity;Increase Strength and Stamina;Able to understand and use rate of perceived exertion (RPE) scale;Knowledge and understanding of Target Heart Rate Range (THRR);Understanding of Exercise Prescription  Increase Physical Activity;Increase Strength and Stamina;Able to understand and use rate of perceived exertion (RPE) scale;Able to understand and use Dyspnea scale;Knowledge and understanding of Target Heart Rate Range (THRR);Understanding of Exercise Prescription  Increase Physical Activity;Able to understand and use rate of perceived exertion (RPE) scale;Increase Strength and Stamina;Knowledge and understanding of Target Heart Rate Range (THRR);Able to check pulse independently;Understanding of Exercise Prescription   Comments  Reviewed RPE scale, THR and program prescription with  pt today.  Pt voiced understanding and was given a copy of goals to take home.  Reviewed home exercise with pt today.  Pt plans to walk at home for exercise.  Reviewed THR, pulse, RPE, sign and symptoms, NTG use, and when to call 911 or MD.  Also discussed weather considerations and indoor options.  Pt voiced understanding.  Lio is doing well in rehab so far.  He plans to increase levels at next session.  he attends 2 days per week due to dialysis on other days.  Aniruddh increased levels on both machines today.  he is walking and doing strength work 2 days per week outside class.  Orlan attends consistently and works at appropriate RPE.  Staff will monitor progress.   Expected Outcomes  Short: Use RPE daily to regulate intensity. Long: Follow program prescription in THR.  Short: Start to add in walking on off days.  Long: Continue to follow program prescription  Short - continue to attend consistently Long :  increase overall MET level  Short - continue home exercise Long - maintain fitness independently  Short - continue to gradually build intensity Long - improve overall MET level      Discharge Exercise Prescription (Final Exercise Prescription Changes): Exercise Prescription Changes - 12/15/18 1100      Response to Exercise   Blood Pressure (Admit)  114/58    Blood Pressure (Exercise)  118/60    Blood Pressure (Exit)  110/56    Heart Rate (Admit)  90 bpm    Heart Rate (Exercise)  111 bpm    Heart Rate (Exit)  89 bpm    Rating of Perceived Exertion (Exercise)  14    Symptoms  none    Duration  Continue with 30 min of aerobic exercise without signs/symptoms of physical distress.    Intensity  THRR unchanged      Progression   Progression  Continue to progress workloads to maintain intensity without signs/symptoms of physical distress.    Average METs  2.8      Resistance Training   Training Prescription  Yes    Weight  4     Reps  10-15      Interval Training   Interval Training  No       NuStep   Level  5    SPM  50    Minutes  15    METs  3.6      REL-XR   Level  2    Speed  50  Minutes  15    METs  1.9      Home Exercise Plan   Plans to continue exercise at  Home (comment)   walking   Frequency  Add 2 additional days to program exercise sessions.    Initial Home Exercises Provided  11/18/18       Nutrition:  Target Goals: Understanding of nutrition guidelines, daily intake of sodium <1580m, cholesterol <2045m calories 30% from fat and 7% or less from saturated fats, daily to have 5 or more servings of fruits and vegetables.  Biometrics: Pre Biometrics - 11/09/18 1549      Pre Biometrics   Height  5' 9.75" (1.772 m)    Weight  188 lb 8 oz (85.5 kg)    BMI (Calculated)  27.23    Single Leg Stand  7.06 seconds        Nutrition Therapy Plan and Nutrition Goals: Nutrition Therapy & Goals - 12/07/18 0812      Personal Nutrition Goals   Comments  Pt reported in the hospital (renal and HH diet) his fluid retention dropped and his stomach was not as "bloated". Pt has renal diettian follwoing him; went over CKD on dialysis basics and provided handouts. Went over gout basics and provided handout (pt reports organ meats, steak and pork chops are his most noticible triggers and is on medication). Pt is on coumadin and continues to eat a consistent amount of leafy greens (working with doctor). Discussed Hh eating in the context of his other issues, discussed how it will be a balancing act and we may have to fine tune things. Pt reports protein is WNL since including eggs, he takes his renal vitamin after dialysis, and he is on a phosphate binder. Pt reports eating cheerios or oatmeal on non-dialysis days and nothing on dialysis days for B. L both days is eggs with tu[place]acon. S grits and peanut butter. D chicken or fish and vegtables. Pt reports sometimes eating fried food but cut out fried foods and red meat for the most part. Discussed low Na eating. Pt is also  mindful of the amount of liquids he consumes (usually water, tea, or juice).       Nutrition Assessments: Nutrition Assessments - 11/11/18 1138      MEDFICTS Scores   Pre Score  43       Nutrition Goals Re-Evaluation: Nutrition Goals Re-Evaluation    Row Name 12/07/18 0809 12/21/18 0856           Goals   Current Weight  188 lb (85.3 kg)  190 lb (86.2 kg)      Nutrition Goal  ST: +1 serving of F/V/day (now having 2/day) LT: gain strength and get back to doing what he was doing before, reducing fluid retention.  ST: continue renal diet LT: gain strength and get back to doing what he was doing before, reducing fluid retention.      Comment  starting him on vitamin D. Energy has been good, but having issues with joints (knees and hips) - not sure if medications - sent message to cardiologist. Told him to refer to the renal handout I gave him to see fruits and vegetables that work with his renal diet. Pt to have report card from dialysis this week. Pt reports energy is good. Pt now limits fried foods and instead uses shake n' bake for chicken and BBQ on pork chops baked - advised to just be mindful of the salt in BBQ sauce and  shake n' Bake. Pt reports dry weight is 85 kg.  Pt reports per dialysis report card his K+ was up, but is now coming back down. He believes it was the nacho basket he got from [employer] with tomatoes and beans. Pt reports being tired of the renal diet after being on dialysis so long and expressed frustration, but acceptance. Encouraged pt that he was doing a good job. Pt reports eating the foods less desired on the renal and HH diet in moderation (ex. 1 piece of bacon instead of all they give yoyu when eating out). Pt reports not needing anything from RD at this time.      Expected Outcome  Manage edema, dialysis, and gout.  Manage edema, dialysis, and gout.         Nutrition Goals Discharge (Final Nutrition Goals Re-Evaluation): Nutrition Goals Re-Evaluation - 12/21/18  0856      Goals   Current Weight  190 lb (86.2 kg)    Nutrition Goal  ST: continue renal diet LT: gain strength and get back to doing what he was doing before, reducing fluid retention.    Comment  Pt reports per dialysis report card his K+ was up, but is now coming back down. He believes it was the nacho basket he got from [employer] with tomatoes and beans. Pt reports being tired of the renal diet after being on dialysis so long and expressed frustration, but acceptance. Encouraged pt that he was doing a good job. Pt reports eating the foods less desired on the renal and HH diet in moderation (ex. 1 piece of bacon instead of all they give yoyu when eating out). Pt reports not needing anything from RD at this time.    Expected Outcome  Manage edema, dialysis, and gout.       Psychosocial: Target Goals: Acknowledge presence or absence of significant depression and/or stress, maximize coping skills, provide positive support system. Participant is able to verbalize types and ability to use techniques and skills needed for reducing stress and depression.   Initial Review & Psychosocial Screening: Initial Psych Review & Screening - 11/04/18 1049      Initial Review   Current issues with  Current Stress Concerns    Source of Stress Concerns  Chronic Illness    Comments  Simeon is on HD M,W,F. He has a good support system and is handling it well. He stays on top of managing his health, but it gets exhausting at times.      Family Dynamics   Good Support System?  Yes      Barriers   Psychosocial barriers to participate in program  There are no identifiable barriers or psychosocial needs.      Screening Interventions   Interventions  Encouraged to exercise;Provide feedback about the scores to participant    Expected Outcomes  Short Term goal: Utilizing psychosocial counselor, staff and physician to assist with identification of specific Stressors or current issues interfering with healing process.  Setting desired goal for each stressor or current issue identified.;Long Term Goal: Stressors or current issues are controlled or eliminated.;Short Term goal: Identification and review with participant of any Quality of Life or Depression concerns found by scoring the questionnaire.;Long Term goal: The participant improves quality of Life and PHQ9 Scores as seen by post scores and/or verbalization of changes       Quality of Life Scores:  Quality of Life - 11/11/18 1138      Quality of Life  Select  Quality of Life      Quality of Life Scores   Health/Function Pre  20.73 %    Socioeconomic Pre  19 %    Psych/Spiritual Pre  20.86 %    Family Pre  20.6 %    GLOBAL Pre  20.38 %      Scores of 19 and below usually indicate a poorer quality of life in these areas.  A difference of  2-3 points is a clinically meaningful difference.  A difference of 2-3 points in the total score of the Quality of Life Index has been associated with significant improvement in overall quality of life, self-image, physical symptoms, and general health in studies assessing change in quality of life.  PHQ-9: Recent Review Flowsheet Data    Depression screen [facility] 2/9 11/09/2018 03/19/2018 05/17/2017   Decreased Interest 0 0 3   Down, Depressed, Hopeless 0 0 3   PHQ - 2 Score 0 0 6   Altered sleeping 0 - 3   Tired, decreased energy 0 - 3   Change in appetite 0 - 3   Feeling bad or failure about yourself  0 - 3   Trouble concentrating 0 - 2   Moving slowly or fidgety/restless 0 - 2   Suicidal thoughts 0 - 3   PHQ-9 Score 0 - 25   Difficult doing work/chores Not difficult at all - Extremely dIfficult     Interpretation of Total Score  Total Score Depression Severity:  1-4 = Minimal depression, 5-9 = Mild depression, 10-14 = Moderate depression, 15-19 = Moderately severe depression, 20-27 = Severe depression   Psychosocial Evaluation and Intervention:   Psychosocial Re-Evaluation: Psychosocial Re-Evaluation     Row Name 12/23/18 0810             Psychosocial Re-Evaluation   Current issues with  Current Stress Concerns;Current Sleep Concerns       Comments  Gaven says he has no current stress or sleep concerns       Expected Outcomes  Short - continue exercise Long - manage stress long term       Interventions  Encouraged to attend Cardiac Rehabilitation for the exercise          Psychosocial Discharge (Final Psychosocial Re-Evaluation): Psychosocial Re-Evaluation - 12/23/18 0810      Psychosocial Re-Evaluation   Current issues with  Current Stress Concerns;Current Sleep Concerns    Comments  Zacory says he has no current stress or sleep concerns    Expected Outcomes  Short - continue exercise Long - manage stress long term    Interventions  Encouraged to attend Cardiac Rehabilitation for the exercise       Vocational Rehabilitation: Provide vocational rehab assistance to qualifying candidates.   Vocational Rehab Evaluation & Intervention: Vocational Rehab - 11/04/18 1048      Initial Vocational Rehab Evaluation & Intervention   Assessment shows need for Vocational Rehabilitation  No       Education: Education Goals: Education classes will be provided on a variety of topics geared toward better understanding of heart health and risk factor modification. Participant will state understanding/return demonstration of topics presented as noted by education test scores.  Learning Barriers/Preferences: Learning Barriers/Preferences - 11/04/18 1048      Learning Barriers/Preferences   Learning Barriers  None    Learning Preferences  None       Education Topics:  AED/CPR: - Group verbal and written instruction with the use of models to demonstrate  the basic use of the AED with the basic ABC's of resuscitation.   General Nutrition Guidelines/Fats and Fiber: -Group instruction provided by verbal, written material, models and posters to present the general guidelines for heart  healthy nutrition. Gives an explanation and review of dietary fats and fiber.   Controlling Sodium/Reading Food Labels: -Group verbal and written material supporting the discussion of sodium use in heart healthy nutrition. Review and explanation with models, verbal and written materials for utilization of the food label.   Exercise Physiology & General Exercise Guidelines: - Group verbal and written instruction with models to review the exercise physiology of the cardiovascular system and associated critical values. Provides general exercise guidelines with specific guidelines to those with heart or lung disease.    Aerobic Exercise & Resistance Training: - Gives group verbal and written instruction on the various components of exercise. Focuses on aerobic and resistive training programs and the benefits of this training and how to safely progress through these programs..   Flexibility, Balance, Mind/Body Relaxation: Provides group verbal/written instruction on the benefits of flexibility and balance training, including mind/body exercise modes such as yoga, pilates and tai chi.  Demonstration and skill practice provided.   Stress and Anxiety: - Provides group verbal and written instruction about the health risks of elevated stress and causes of high stress.  Discuss the correlation between heart/lung disease and anxiety and treatment options. Review healthy ways to manage with stress and anxiety.   Depression: - Provides group verbal and written instruction on the correlation between heart/lung disease and depressed mood, treatment options, and the stigmas associated with seeking treatment.   Anatomy & Physiology of the Heart: - Group verbal and written instruction and models provide basic cardiac anatomy and physiology, with the coronary electrical and arterial systems. Review of Valvular disease and Heart Failure   Cardiac Procedures: - Group verbal and written instruction to  review commonly prescribed medications for heart disease. Reviews the medication, class of the drug, and side effects. Includes the steps to properly store meds and maintain the prescription regimen. (beta blockers and nitrates)   Cardiac Medications I: - Group verbal and written instruction to review commonly prescribed medications for heart disease. Reviews the medication, class of the drug, and side effects. Includes the steps to properly store meds and maintain the prescription regimen.   Cardiac Medications II: -Group verbal and written instruction to review commonly prescribed medications for heart disease. Reviews the medication, class of the drug, and side effects. (all other drug classes)    Go Sex-Intimacy & Heart Disease, Get SMART - Goal Setting: - Group verbal and written instruction through game format to discuss heart disease and the return to sexual intimacy. Provides group verbal and written material to discuss and apply goal setting through the application of the S.M.A.R.T. Method.   Other Matters of the Heart: - Provides group verbal, written materials and models to describe Stable Angina and Peripheral Artery. Includes description of the disease process and treatment options available to the cardiac patient.   Exercise & Equipment Safety: - Individual verbal instruction and demonstration of equipment use and safety with use of the equipment.   Cardiac Rehab from 11/09/2018 in [facility] Cardiac and Pulmonary Rehab  Date  11/09/18  Educator  AS  Instruction Review Code  1- Verbalizes Understanding      Infection Prevention: - Provides verbal and written material to individual with discussion of infection control including proper hand washing and proper equipment cleaning during exercise session.  Cardiac Rehab from 11/09/2018 in [facility] Cardiac and Pulmonary Rehab  Date  11/09/18  Educator  AS  Instruction Review Code  1- Verbalizes Understanding      Falls Prevention: -  Provides verbal and written material to individual with discussion of falls prevention and safety.   Cardiac Rehab from 11/09/2018 in [facility] Cardiac and Pulmonary Rehab  Date  11/09/18  Educator  AS  Instruction Review Code  1- Verbalizes Understanding      Diabetes: - Individual verbal and written instruction to review signs/symptoms of diabetes, desired ranges of glucose level fasting, after meals and with exercise. Acknowledge that pre and post exercise glucose checks will be done for 3 sessions at entry of program.   Know Your Numbers and Risk Factors: -Group verbal and written instruction about important numbers in your health.  Discussion of what are risk factors and how they play a role in the disease process.  Review of Cholesterol, Blood Pressure, Diabetes, and BMI and the role they play in your overall health.   Sleep Hygiene: -Provides group verbal and written instruction about how sleep can affect your health.  Define sleep hygiene, discuss sleep cycles and impact of sleep habits. Review good sleep hygiene tips.    Other: -Provides group and verbal instruction on various topics (see comments)   Knowledge Questionnaire Score: Knowledge Questionnaire Score - 11/11/18 1138      Knowledge Questionnaire Score   Pre Score  21/26  Angina, Exercise.Depression missed       Core Components/Risk Factors/Patient Goals at Admission: Personal Goals and Risk Factors at Admission - 11/09/18 1552      Core Components/Risk Factors/Patient Goals on Admission    Weight Management  Weight Maintenance    Heart Failure  Yes    Hypertension  Yes    Intervention  Provide education on lifestyle modifcations including regular physical activity/exercise, weight management, moderate sodium restriction and increased consumption of fresh fruit, vegetables, and low fat dairy, alcohol moderation, and smoking cessation.;Monitor prescription use compliance.    Expected Outcomes  Short Term: Continued  assessment and intervention until BP is < 140/47m HG in hypertensive participants. < 130/866mHG in hypertensive participants with diabetes, heart failure or chronic kidney disease.;Long Term: Maintenance of blood pressure at goal levels.    Intervention  Provide education and support for participant on nutrition & aerobic/resistive exercise along with prescribed medications to achieve LDL <7063mHDL >33m64m  Expected Outcomes  Long Term: Cholesterol controlled with medications as prescribed, with individualized exercise RX and with personalized nutrition plan. Value goals: LDL < 70mg12mL > 40 mg.    Stress  Yes       Core Components/Risk Factors/Patient Goals Review:  Goals and Risk Factor Review    Row Name 11/30/18 0811 12/23/18 0804           Core Components/Risk Factors/Patient Goals Review   Personal Goals Review  Weight Management/Obesity;Develop more efficient breathing techniques such as purse lipped breathing and diaphragmatic breathing and practicing self-pacing with activity.;Hypertension;Lipids  Weight Management/Obesity;Lipids;Hypertension      Review  Thorsten is taking meds as directed and measuring BP at home.  BP has been running normal at home and at HT.  [facility] has some knee and hip pain - was taking hydrochloriquine which helped but he cant take it at this time.  Bransen has "aggressive osteoarthritis".  We discussed doing some ROM exercises to help with this.  He also takes Tylenol.  Jeramiah cKordellinues to take  meds as directed.He had a cortisone shot for his knee and it helped briefly.  He spoke with his PA about taking glucosamine and is going to try that.      Expected Outcomes  Short - continue exercise two days not at HT - add ROM exercises Long - maintain overall exercise and health habits  Short - try glucosamine and stretches Long - maintain heart healthy habits         Core Components/Risk Factors/Patient Goals at Discharge (Final Review):  Goals and Risk Factor Review - 12/23/18  0804      Core Components/Risk Factors/Patient Goals Review   Personal Goals Review  Weight Management/Obesity;Lipids;Hypertension    Review  Erich continues to take meds as directed.He had a cortisone shot for his knee and it helped briefly.  He spoke with his PA about taking glucosamine and is going to try that.    Expected Outcomes  Short - try glucosamine and stretches Long - maintain heart healthy habits       ITP Comments: ITP Comments    Row Name 11/04/18 1055 11/09/18 1542 11/11/18 0840 11/16/18 0825 11/17/18 0548   ITP Comments  Virtual RN orientation completed. Diagnosis can be found in [facility] 6/8. EP/RD orientation scheduled for 8/4 at 1:30  Completed initial 6 MWT and nutrition consult.  Initial ITP created and sent to Dr Emily Filbert, Medical Director  First full day of exercise!  Patient was oriented to gym and equipment including functions, settings, policies, and procedures.  Patient's individual exercise prescription and treatment plan were reviewed.  All starting workloads were established based on the results of the 6 minute walk test done at initial orientation visit.  The plan for exercise progression was also introduced and progression will be customized based on patient's performance and goals.  Oneal stated that after he went home last week his chest was very sore.  Remains sore today. He was advised to decrease or not use weights today.  Also to call his doctor if pain continues.  30 Day Review Completed today. Continue with ITP unless changed by Medical Director review.   [place] Name 12/09/18 0911 12/15/18 0544 12/23/18 0805       ITP Comments  Toran had lower BP today after dialysis yesterday  30 Day review. Continue with ITP unless directed changes per Medical Director review.  Pt states Dialysis has increased his dry weight by half a kilogram        Comments: 30 day review

## 2019-01-12 NOTE — Telephone Encounter (Signed)
If Home Health RN is calling please get Coumadin Nurse on the phone STAT  1.  Are you calling in regards to an appointment?  No   2.  Are you calling for a refill ? No   3.  Are you having bleeding issues? No   4.  Do you need clearance to hold Coumadin? No     Home testing results 3.0 on Monday 01/10/19 per patient  Please route to the [place]

## 2019-01-12 NOTE — Patient Instructions (Signed)
Spoke with pt and instructed him to continue dosage of 1 TABLET EVERY DAY EXCEPT 1/2 TABLET ON MONDAYS & FRIDAYS.  Recheck INR in 1 week - call [phone] w/ results.

## 2019-01-13 ENCOUNTER — Ambulatory Visit (INDEPENDENT_AMBULATORY_CARE_PROVIDER_SITE_OTHER): Payer: Medicare Other

## 2019-01-13 ENCOUNTER — Other Ambulatory Visit: Payer: Self-pay

## 2019-01-13 DIAGNOSIS — I5022 Chronic systolic (congestive) heart failure: Secondary | ICD-10-CM | POA: Diagnosis not present

## 2019-01-14 DIAGNOSIS — D631 Anemia in chronic kidney disease: Secondary | ICD-10-CM | POA: Diagnosis not present

## 2019-01-14 DIAGNOSIS — Z992 Dependence on renal dialysis: Secondary | ICD-10-CM | POA: Diagnosis not present

## 2019-01-14 DIAGNOSIS — N186 End stage renal disease: Secondary | ICD-10-CM | POA: Diagnosis not present

## 2019-01-14 DIAGNOSIS — N2581 Secondary hyperparathyroidism of renal origin: Secondary | ICD-10-CM | POA: Diagnosis not present

## 2019-01-17 ENCOUNTER — Ambulatory Visit (INDEPENDENT_AMBULATORY_CARE_PROVIDER_SITE_OTHER): Payer: Medicare Other

## 2019-01-17 ENCOUNTER — Telehealth: Payer: Self-pay

## 2019-01-17 DIAGNOSIS — D631 Anemia in chronic kidney disease: Secondary | ICD-10-CM | POA: Diagnosis not present

## 2019-01-17 DIAGNOSIS — Z992 Dependence on renal dialysis: Secondary | ICD-10-CM | POA: Diagnosis not present

## 2019-01-17 DIAGNOSIS — N186 End stage renal disease: Secondary | ICD-10-CM | POA: Diagnosis not present

## 2019-01-17 DIAGNOSIS — I4821 Permanent atrial fibrillation: Secondary | ICD-10-CM

## 2019-01-17 DIAGNOSIS — Z5181 Encounter for therapeutic drug level monitoring: Secondary | ICD-10-CM | POA: Diagnosis not present

## 2019-01-17 DIAGNOSIS — Z952 Presence of prosthetic heart valve: Secondary | ICD-10-CM | POA: Diagnosis not present

## 2019-01-17 DIAGNOSIS — N2581 Secondary hyperparathyroidism of renal origin: Secondary | ICD-10-CM | POA: Diagnosis not present

## 2019-01-17 LAB — POCT INR: INR: 1.9 — AB (ref 2.0–3.0)

## 2019-01-17 LAB — LAB REPORT - SCANNED: INR: 1.9

## 2019-01-17 NOTE — Patient Instructions (Signed)
Lmom instructing pt to take 1.5 tablets today, then continue dosage of 1 TABLET EVERY DAY EXCEPT 1/2 TABLET ON MONDAYS & FRIDAYS.  Asked him to call back w/ any questions or concerns.  Recheck INR in 1 week - call [phone] w/ results.

## 2019-01-17 NOTE — Telephone Encounter (Signed)
Please see anti-coag note for today. 

## 2019-01-17 NOTE — Telephone Encounter (Signed)
Patient calling  INR result: 1.9 States he may have eaten too many greens this week Please call to discuss

## 2019-01-18 ENCOUNTER — Other Ambulatory Visit: Payer: Self-pay

## 2019-01-18 ENCOUNTER — Encounter: Payer: Medicare Other | Admitting: *Deleted

## 2019-01-18 DIAGNOSIS — I2510 Atherosclerotic heart disease of native coronary artery without angina pectoris: Secondary | ICD-10-CM | POA: Diagnosis not present

## 2019-01-18 DIAGNOSIS — I5020 Unspecified systolic (congestive) heart failure: Secondary | ICD-10-CM | POA: Diagnosis not present

## 2019-01-18 DIAGNOSIS — E785 Hyperlipidemia, unspecified: Secondary | ICD-10-CM | POA: Diagnosis not present

## 2019-01-18 DIAGNOSIS — I120 Hypertensive chronic kidney disease with stage 5 chronic kidney disease or end stage renal disease: Secondary | ICD-10-CM | POA: Diagnosis not present

## 2019-01-18 DIAGNOSIS — Z951 Presence of aortocoronary bypass graft: Secondary | ICD-10-CM | POA: Diagnosis not present

## 2019-01-18 DIAGNOSIS — D631 Anemia in chronic kidney disease: Secondary | ICD-10-CM | POA: Diagnosis not present

## 2019-01-18 NOTE — Progress Notes (Signed)
Daily Session Note  Patient Details  Name: Jeremy [name] MRN: [id number] Date of Birth: 01/11/1956 Referring Provider:     Cardiac Rehab from 11/09/2018 in [facility] Cardiac and Pulmonary Rehab  Referring Provider  [name]      Encounter Date: 01/18/2019  Check In: Session Check In - 01/18/19 0805      Check-In   Supervising physician immediately available to respond to emergencies  See telemetry face sheet for immediately available ER MD    Location  [facility]-Cardiac & Pulmonary Rehab    Staff Present  Heath Lark, RN, BSN, CCRP;Laureen Owens Shark, BS, RRT, CPFT;Joseph Montgomery [place]    Virtual Visit  No    Medication changes reported      No    Fall or balance concerns reported     No    Warm-up and Cool-down  Performed on first and last piece of equipment    Resistance Training Performed  Yes    VAD Patient?  No    PAD/SET Patient?  No      Pain Assessment   Currently in Pain?  No/denies          Social History   Tobacco Use  Smoking Status Never Smoker  Smokeless Tobacco Never Used    Goals Met:  Independence with exercise equipment Exercise tolerated well No report of cardiac concerns or symptoms  Goals Unmet:  Not Applicable  Comments: Pt able to follow exercise prescription today without complaint.  Will continue to monitor for progression.    Dr. Emily Filbert is Medical Director for [place] and [facility].

## 2019-01-18 NOTE — Telephone Encounter (Signed)
This encounter was created in error - please disregard.

## 2019-01-19 DIAGNOSIS — D631 Anemia in chronic kidney disease: Secondary | ICD-10-CM | POA: Diagnosis not present

## 2019-01-19 DIAGNOSIS — Z992 Dependence on renal dialysis: Secondary | ICD-10-CM | POA: Diagnosis not present

## 2019-01-19 DIAGNOSIS — N2581 Secondary hyperparathyroidism of renal origin: Secondary | ICD-10-CM | POA: Diagnosis not present

## 2019-01-19 DIAGNOSIS — N186 End stage renal disease: Secondary | ICD-10-CM | POA: Diagnosis not present

## 2019-01-20 ENCOUNTER — Encounter: Payer: Medicare Other | Admitting: *Deleted

## 2019-01-20 ENCOUNTER — Other Ambulatory Visit: Payer: Self-pay

## 2019-01-20 DIAGNOSIS — I120 Hypertensive chronic kidney disease with stage 5 chronic kidney disease or end stage renal disease: Secondary | ICD-10-CM | POA: Diagnosis not present

## 2019-01-20 DIAGNOSIS — Z951 Presence of aortocoronary bypass graft: Secondary | ICD-10-CM | POA: Diagnosis not present

## 2019-01-20 DIAGNOSIS — I5020 Unspecified systolic (congestive) heart failure: Secondary | ICD-10-CM | POA: Diagnosis not present

## 2019-01-20 DIAGNOSIS — E785 Hyperlipidemia, unspecified: Secondary | ICD-10-CM | POA: Diagnosis not present

## 2019-01-20 DIAGNOSIS — I2510 Atherosclerotic heart disease of native coronary artery without angina pectoris: Secondary | ICD-10-CM | POA: Diagnosis not present

## 2019-01-20 DIAGNOSIS — D631 Anemia in chronic kidney disease: Secondary | ICD-10-CM | POA: Diagnosis not present

## 2019-01-20 NOTE — Progress Notes (Signed)
Daily Session Note  Patient Details  Name: Jeremy [name] MRN: [id number] Date of Birth: [birth year] Referring Provider:     Cardiac Rehab from 11/09/2018 in [facility] Cardiac and Pulmonary Rehab  Referring Provider  [name]      Encounter Date: 01/20/2019  Check In: Session Check In - 01/20/19 0805      Check-In   Supervising physician immediately available to respond to emergencies  See telemetry face sheet for immediately available ER MD    Location  [facility]-Cardiac & Pulmonary Rehab    Staff Present  Heath Lark, RN, BSN, CCRP;Joseph Hood RCP,RRT,BSRT;Jessica [place], [place], [place], [place], CCET    Virtual Visit  No    Medication changes reported      No    Fall or balance concerns reported     No    Warm-up and Cool-down  Performed on first and last piece of equipment    Resistance Training Performed  Yes    VAD Patient?  No    PAD/SET Patient?  No      Pain Assessment   Currently in Pain?  No/denies          Social History   Tobacco Use  Smoking Status Never Smoker  Smokeless Tobacco Never Used    Goals Met:  Independence with exercise equipment Exercise tolerated well No report of cardiac concerns or symptoms  Goals Unmet:  Not Applicable  Comments: Pt able to follow exercise prescription today without complaint.  Will continue to monitor for progression.    Dr. Emily Filbert is Medical Director for [place] and [facility].

## 2019-01-21 DIAGNOSIS — D631 Anemia in chronic kidney disease: Secondary | ICD-10-CM | POA: Diagnosis not present

## 2019-01-21 DIAGNOSIS — N2581 Secondary hyperparathyroidism of renal origin: Secondary | ICD-10-CM | POA: Diagnosis not present

## 2019-01-21 DIAGNOSIS — N186 End stage renal disease: Secondary | ICD-10-CM | POA: Diagnosis not present

## 2019-01-21 DIAGNOSIS — Z992 Dependence on renal dialysis: Secondary | ICD-10-CM | POA: Diagnosis not present

## 2019-01-24 ENCOUNTER — Ambulatory Visit (INDEPENDENT_AMBULATORY_CARE_PROVIDER_SITE_OTHER): Payer: Medicare Other

## 2019-01-24 ENCOUNTER — Telehealth: Payer: Self-pay | Admitting: Cardiovascular Disease

## 2019-01-24 DIAGNOSIS — I4821 Permanent atrial fibrillation: Secondary | ICD-10-CM

## 2019-01-24 DIAGNOSIS — N2581 Secondary hyperparathyroidism of renal origin: Secondary | ICD-10-CM | POA: Diagnosis not present

## 2019-01-24 DIAGNOSIS — D631 Anemia in chronic kidney disease: Secondary | ICD-10-CM | POA: Diagnosis not present

## 2019-01-24 DIAGNOSIS — Z5181 Encounter for therapeutic drug level monitoring: Secondary | ICD-10-CM

## 2019-01-24 DIAGNOSIS — Z992 Dependence on renal dialysis: Secondary | ICD-10-CM | POA: Diagnosis not present

## 2019-01-24 DIAGNOSIS — Z952 Presence of prosthetic heart valve: Secondary | ICD-10-CM

## 2019-01-24 DIAGNOSIS — N186 End stage renal disease: Secondary | ICD-10-CM | POA: Diagnosis not present

## 2019-01-24 LAB — POCT INR: INR: 2.5 (ref 2.0–3.0)

## 2019-01-24 LAB — LAB REPORT - SCANNED: INR: 2.5

## 2019-01-24 NOTE — Patient Instructions (Signed)
Spoke w/ pt and advised him to continue dosage of 1 TABLET EVERY DAY EXCEPT 1/2 TABLET ON MONDAYS & FRIDAYS. .  Recheck INR in 1 week - call [phone] w/ results.

## 2019-01-24 NOTE — Telephone Encounter (Signed)
If Home Health RN is calling please get Coumadin Nurse on the phone STAT  1.  Are you calling in regards to an appointment? NO  2.  Are you calling for a refill ? NO  3.  Are you having bleeding issues? NO  4.  Do you need clearance to hold Coumadin? NO  Patient calling in home results 2.5    Please route to the [place]

## 2019-01-24 NOTE — Telephone Encounter (Signed)
Please see anti-coag note for today. 

## 2019-01-25 ENCOUNTER — Encounter: Payer: Medicare Other | Admitting: *Deleted

## 2019-01-25 ENCOUNTER — Other Ambulatory Visit: Payer: Self-pay

## 2019-01-25 ENCOUNTER — Other Ambulatory Visit: Payer: Self-pay | Admitting: Thoracic Surgery (Cardiothoracic Vascular Surgery)

## 2019-01-25 DIAGNOSIS — I2510 Atherosclerotic heart disease of native coronary artery without angina pectoris: Secondary | ICD-10-CM | POA: Diagnosis not present

## 2019-01-25 DIAGNOSIS — Z951 Presence of aortocoronary bypass graft: Secondary | ICD-10-CM

## 2019-01-25 DIAGNOSIS — E785 Hyperlipidemia, unspecified: Secondary | ICD-10-CM | POA: Diagnosis not present

## 2019-01-25 DIAGNOSIS — D631 Anemia in chronic kidney disease: Secondary | ICD-10-CM | POA: Diagnosis not present

## 2019-01-25 DIAGNOSIS — I120 Hypertensive chronic kidney disease with stage 5 chronic kidney disease or end stage renal disease: Secondary | ICD-10-CM | POA: Diagnosis not present

## 2019-01-25 DIAGNOSIS — I5020 Unspecified systolic (congestive) heart failure: Secondary | ICD-10-CM | POA: Diagnosis not present

## 2019-01-25 NOTE — Progress Notes (Signed)
Daily Session Note  Patient Details  Name: Jeremy [name] MRN: [id number] Date of Birth: 29-Dec-1955 Referring Provider:     Cardiac Rehab from 11/09/2018 in [facility] Cardiac and Pulmonary Rehab  Referring Provider  [name]      Encounter Date: 01/25/2019  Check In: Session Check In - 01/25/19 0809      Check-In   Supervising physician immediately available to respond to emergencies  See telemetry face sheet for immediately available ER MD    Location  [facility]-Cardiac & Pulmonary Rehab    Staff Present  Nada Maclachlan, BA, ACSM CEP, Exercise Physiologist;Susanne Bice, RN, BSN, CCRP;Joseph Hood RCP,RRT,BSRT    Medication changes reported      No    Fall or balance concerns reported     No    Warm-up and Cool-down  Performed on first and last piece of equipment    Resistance Training Performed  Yes      Pain Assessment   Currently in Pain?  No/denies    Multiple Pain Sites  No          Social History   Tobacco Use  Smoking Status Never Smoker  Smokeless Tobacco Never Used    Goals Met:  Independence with exercise equipment Exercise tolerated well Personal goals reviewed  Goals Unmet:  Not Applicable  Comments: Pt able to follow exercise prescription today without complaint.  Will continue to monitor for progression.    Dr. Emily Filbert is Medical Director for [place] and [facility].

## 2019-01-26 DIAGNOSIS — N186 End stage renal disease: Secondary | ICD-10-CM | POA: Diagnosis not present

## 2019-01-26 DIAGNOSIS — D631 Anemia in chronic kidney disease: Secondary | ICD-10-CM | POA: Diagnosis not present

## 2019-01-26 DIAGNOSIS — N2581 Secondary hyperparathyroidism of renal origin: Secondary | ICD-10-CM | POA: Diagnosis not present

## 2019-01-26 DIAGNOSIS — Z992 Dependence on renal dialysis: Secondary | ICD-10-CM | POA: Diagnosis not present

## 2019-01-27 ENCOUNTER — Encounter: Payer: Medicare Other | Admitting: *Deleted

## 2019-01-27 ENCOUNTER — Other Ambulatory Visit: Payer: Self-pay

## 2019-01-27 DIAGNOSIS — M13841 Other specified arthritis, right hand: Secondary | ICD-10-CM | POA: Diagnosis not present

## 2019-01-27 DIAGNOSIS — I120 Hypertensive chronic kidney disease with stage 5 chronic kidney disease or end stage renal disease: Secondary | ICD-10-CM | POA: Diagnosis not present

## 2019-01-27 DIAGNOSIS — Z951 Presence of aortocoronary bypass graft: Secondary | ICD-10-CM

## 2019-01-27 DIAGNOSIS — I5020 Unspecified systolic (congestive) heart failure: Secondary | ICD-10-CM | POA: Diagnosis not present

## 2019-01-27 DIAGNOSIS — M79644 Pain in right finger(s): Secondary | ICD-10-CM | POA: Diagnosis not present

## 2019-01-27 DIAGNOSIS — D631 Anemia in chronic kidney disease: Secondary | ICD-10-CM | POA: Diagnosis not present

## 2019-01-27 DIAGNOSIS — I2510 Atherosclerotic heart disease of native coronary artery without angina pectoris: Secondary | ICD-10-CM | POA: Diagnosis not present

## 2019-01-27 DIAGNOSIS — M19031 Primary osteoarthritis, right wrist: Secondary | ICD-10-CM | POA: Diagnosis not present

## 2019-01-27 DIAGNOSIS — E785 Hyperlipidemia, unspecified: Secondary | ICD-10-CM | POA: Diagnosis not present

## 2019-01-27 NOTE — Progress Notes (Signed)
Daily Session Note  Patient Details  Name: Jeremy [name] MRN: [id number] Date of Birth: 1955-09-08 Referring Provider:     Cardiac Rehab from 11/09/2018 in [facility] Cardiac and Pulmonary Rehab  Referring Provider  [name]      Encounter Date: 01/27/2019  Check In: Session Check In - 01/27/19 0806      Check-In   Supervising physician immediately available to respond to emergencies  See telemetry face sheet for immediately available ER MD    Location  [facility]-Cardiac & Pulmonary Rehab    Staff Present  Heath Lark, RN, BSN, CCRP;Jeanna Durrell BS, Exercise Physiologist;Amanda Oletta Darter, BA, ACSM CEP, Exercise Physiologist    Virtual Visit  No    Medication changes reported      No    Fall or balance concerns reported     No    Warm-up and Cool-down  Performed on first and last piece of equipment    Resistance Training Performed  Yes    VAD Patient?  No    PAD/SET Patient?  No      Pain Assessment   Currently in Pain?  No/denies          Social History   Tobacco Use  Smoking Status Never Smoker  Smokeless Tobacco Never Used    Goals Met:  Independence with exercise equipment Exercise tolerated well No report of cardiac concerns or symptoms  Goals Unmet:  Not Applicable  Comments: Pt able to follow exercise prescription today without complaint.  Will continue to monitor for progression.    Dr. Emily Filbert is Medical Director for [place] and [facility].

## 2019-01-28 DIAGNOSIS — N2581 Secondary hyperparathyroidism of renal origin: Secondary | ICD-10-CM | POA: Diagnosis not present

## 2019-01-28 DIAGNOSIS — D631 Anemia in chronic kidney disease: Secondary | ICD-10-CM | POA: Diagnosis not present

## 2019-01-28 DIAGNOSIS — N186 End stage renal disease: Secondary | ICD-10-CM | POA: Diagnosis not present

## 2019-01-28 DIAGNOSIS — Z992 Dependence on renal dialysis: Secondary | ICD-10-CM | POA: Diagnosis not present

## 2019-01-31 DIAGNOSIS — N2581 Secondary hyperparathyroidism of renal origin: Secondary | ICD-10-CM | POA: Diagnosis not present

## 2019-01-31 DIAGNOSIS — Z992 Dependence on renal dialysis: Secondary | ICD-10-CM | POA: Diagnosis not present

## 2019-01-31 DIAGNOSIS — N186 End stage renal disease: Secondary | ICD-10-CM | POA: Diagnosis not present

## 2019-01-31 DIAGNOSIS — D631 Anemia in chronic kidney disease: Secondary | ICD-10-CM | POA: Diagnosis not present

## 2019-02-01 ENCOUNTER — Encounter: Payer: Self-pay | Admitting: Cardiovascular Disease

## 2019-02-01 ENCOUNTER — Other Ambulatory Visit: Payer: Self-pay

## 2019-02-01 ENCOUNTER — Encounter: Payer: Medicare Other | Admitting: *Deleted

## 2019-02-01 ENCOUNTER — Ambulatory Visit (INDEPENDENT_AMBULATORY_CARE_PROVIDER_SITE_OTHER): Payer: Medicare Other | Admitting: Pharmacist

## 2019-02-01 DIAGNOSIS — E785 Hyperlipidemia, unspecified: Secondary | ICD-10-CM | POA: Diagnosis not present

## 2019-02-01 DIAGNOSIS — Z951 Presence of aortocoronary bypass graft: Secondary | ICD-10-CM

## 2019-02-01 DIAGNOSIS — I5020 Unspecified systolic (congestive) heart failure: Secondary | ICD-10-CM | POA: Diagnosis not present

## 2019-02-01 DIAGNOSIS — I2510 Atherosclerotic heart disease of native coronary artery without angina pectoris: Secondary | ICD-10-CM | POA: Diagnosis not present

## 2019-02-01 DIAGNOSIS — I4821 Permanent atrial fibrillation: Secondary | ICD-10-CM | POA: Diagnosis not present

## 2019-02-01 DIAGNOSIS — I120 Hypertensive chronic kidney disease with stage 5 chronic kidney disease or end stage renal disease: Secondary | ICD-10-CM | POA: Diagnosis not present

## 2019-02-01 DIAGNOSIS — D631 Anemia in chronic kidney disease: Secondary | ICD-10-CM | POA: Diagnosis not present

## 2019-02-01 DIAGNOSIS — Z952 Presence of prosthetic heart valve: Secondary | ICD-10-CM | POA: Diagnosis not present

## 2019-02-01 DIAGNOSIS — Z5181 Encounter for therapeutic drug level monitoring: Secondary | ICD-10-CM

## 2019-02-01 LAB — LAB REPORT - SCANNED: INR: 1.7

## 2019-02-01 NOTE — Progress Notes (Signed)
Daily Session Note  Patient Details  Name: Jeremy [name] MRN: [id number] Date of Birth: Dec 27, 1955 Referring Provider:     Cardiac Rehab from 11/09/2018 in [facility] Cardiac and Pulmonary Rehab  Referring Provider  [name]      Encounter Date: 02/01/2019  Check In: Session Check In - 02/01/19 0804      Check-In   Supervising physician immediately available to respond to emergencies  See telemetry face sheet for immediately available ER MD    Location  [facility]-Cardiac & Pulmonary Rehab    Staff Present  Darel Hong, RN BSN;Joseph Foy [place], [place], ACSM CEP, Exercise Physiologist    Virtual Visit  No    Medication changes reported      No    Fall or balance concerns reported     No    Warm-up and Cool-down  Performed on first and last piece of equipment    Resistance Training Performed  Yes    VAD Patient?  No    PAD/SET Patient?  No      Pain Assessment   Currently in Pain?  No/denies          Social History   Tobacco Use  Smoking Status Never Smoker  Smokeless Tobacco Never Used    Goals Met:  Independence with exercise equipment Exercise tolerated well No report of cardiac concerns or symptoms Strength training completed today  Goals Unmet:  Not Applicable  Comments: Pt able to follow exercise prescription today without complaint.  Will continue to monitor for progression.    Dr. Emily Filbert is Medical Director for [place] and [facility].

## 2019-02-02 DIAGNOSIS — N186 End stage renal disease: Secondary | ICD-10-CM | POA: Diagnosis not present

## 2019-02-02 DIAGNOSIS — N2581 Secondary hyperparathyroidism of renal origin: Secondary | ICD-10-CM | POA: Diagnosis not present

## 2019-02-02 DIAGNOSIS — D631 Anemia in chronic kidney disease: Secondary | ICD-10-CM | POA: Diagnosis not present

## 2019-02-02 DIAGNOSIS — Z992 Dependence on renal dialysis: Secondary | ICD-10-CM | POA: Diagnosis not present

## 2019-02-03 ENCOUNTER — Other Ambulatory Visit: Payer: Self-pay

## 2019-02-03 ENCOUNTER — Encounter: Payer: Medicare Other | Admitting: *Deleted

## 2019-02-03 DIAGNOSIS — Z951 Presence of aortocoronary bypass graft: Secondary | ICD-10-CM

## 2019-02-03 DIAGNOSIS — I2510 Atherosclerotic heart disease of native coronary artery without angina pectoris: Secondary | ICD-10-CM | POA: Diagnosis not present

## 2019-02-03 DIAGNOSIS — E785 Hyperlipidemia, unspecified: Secondary | ICD-10-CM | POA: Diagnosis not present

## 2019-02-03 DIAGNOSIS — D631 Anemia in chronic kidney disease: Secondary | ICD-10-CM | POA: Diagnosis not present

## 2019-02-03 DIAGNOSIS — I5020 Unspecified systolic (congestive) heart failure: Secondary | ICD-10-CM | POA: Diagnosis not present

## 2019-02-03 DIAGNOSIS — I120 Hypertensive chronic kidney disease with stage 5 chronic kidney disease or end stage renal disease: Secondary | ICD-10-CM | POA: Diagnosis not present

## 2019-02-03 NOTE — Progress Notes (Signed)
Daily Session Note  Patient Details  Name: Jeremy [name] MRN: [id number] Date of Birth: May 03, 1955 Referring Provider:     Cardiac Rehab from 11/09/2018 in [facility] Cardiac and Pulmonary Rehab  Referring Provider  [name]      Encounter Date: 02/03/2019  Check In: Session Check In - 02/03/19 8185      Check-In   Supervising physician immediately available to respond to emergencies  See telemetry face sheet for immediately available ER MD    Staff Present  Heath Lark, RN, BSN, CCRP;Jeanna Durrell BS, Exercise Physiologist;Amanda Oletta Darter, BA, ACSM CEP, Exercise Physiologist    Virtual Visit  No    Medication changes reported      No    Fall or balance concerns reported     No    Warm-up and Cool-down  Performed on first and last piece of equipment    Resistance Training Performed  Yes    VAD Patient?  No    PAD/SET Patient?  No      Pain Assessment   Currently in Pain?  No/denies          Social History   Tobacco Use  Smoking Status Never Smoker  Smokeless Tobacco Never Used    Goals Met:  Independence with exercise equipment Exercise tolerated well No report of cardiac concerns or symptoms  Goals Unmet:  Not Applicable  Comments: Pt able to follow exercise prescription today without complaint.  Will continue to monitor for progression.    Dr. Emily Filbert is Medical Director for [place] and [facility].

## 2019-02-04 DIAGNOSIS — N2581 Secondary hyperparathyroidism of renal origin: Secondary | ICD-10-CM | POA: Diagnosis not present

## 2019-02-04 DIAGNOSIS — Z992 Dependence on renal dialysis: Secondary | ICD-10-CM | POA: Diagnosis not present

## 2019-02-04 DIAGNOSIS — D631 Anemia in chronic kidney disease: Secondary | ICD-10-CM | POA: Diagnosis not present

## 2019-02-04 DIAGNOSIS — N186 End stage renal disease: Secondary | ICD-10-CM | POA: Diagnosis not present

## 2019-02-05 DIAGNOSIS — N186 End stage renal disease: Secondary | ICD-10-CM | POA: Diagnosis not present

## 2019-02-05 DIAGNOSIS — Z992 Dependence on renal dialysis: Secondary | ICD-10-CM | POA: Diagnosis not present

## 2019-02-05 DIAGNOSIS — T8610 Unspecified complication of kidney transplant: Secondary | ICD-10-CM | POA: Diagnosis not present

## 2019-02-07 ENCOUNTER — Encounter: Payer: Self-pay | Admitting: Cardiovascular Disease

## 2019-02-07 ENCOUNTER — Ambulatory Visit (INDEPENDENT_AMBULATORY_CARE_PROVIDER_SITE_OTHER): Payer: Medicare Other

## 2019-02-07 DIAGNOSIS — I4821 Permanent atrial fibrillation: Secondary | ICD-10-CM

## 2019-02-07 DIAGNOSIS — D631 Anemia in chronic kidney disease: Secondary | ICD-10-CM | POA: Diagnosis not present

## 2019-02-07 DIAGNOSIS — Z5181 Encounter for therapeutic drug level monitoring: Secondary | ICD-10-CM

## 2019-02-07 DIAGNOSIS — Z952 Presence of prosthetic heart valve: Secondary | ICD-10-CM

## 2019-02-07 DIAGNOSIS — Z992 Dependence on renal dialysis: Secondary | ICD-10-CM | POA: Diagnosis not present

## 2019-02-07 DIAGNOSIS — N186 End stage renal disease: Secondary | ICD-10-CM | POA: Diagnosis not present

## 2019-02-07 DIAGNOSIS — N2581 Secondary hyperparathyroidism of renal origin: Secondary | ICD-10-CM | POA: Diagnosis not present

## 2019-02-07 LAB — POCT INR: INR: 2.8 (ref 2.0–3.0)

## 2019-02-07 NOTE — Patient Instructions (Signed)
Spoke w/ pt and advised him to continue dosage of 1 TABLET EVERY DAY EXCEPT 1/2 TABLET ON FRIDAYS.  Recheck INR in 1 week - call [phone] w/ results.

## 2019-02-08 ENCOUNTER — Encounter: Payer: Medicare Other | Attending: Cardiovascular Disease | Admitting: *Deleted

## 2019-02-08 ENCOUNTER — Other Ambulatory Visit: Payer: Self-pay

## 2019-02-08 DIAGNOSIS — Z7901 Long term (current) use of anticoagulants: Secondary | ICD-10-CM | POA: Diagnosis not present

## 2019-02-08 DIAGNOSIS — E785 Hyperlipidemia, unspecified: Secondary | ICD-10-CM | POA: Insufficient documentation

## 2019-02-08 DIAGNOSIS — I5020 Unspecified systolic (congestive) heart failure: Secondary | ICD-10-CM | POA: Insufficient documentation

## 2019-02-08 DIAGNOSIS — D631 Anemia in chronic kidney disease: Secondary | ICD-10-CM | POA: Insufficient documentation

## 2019-02-08 DIAGNOSIS — I255 Ischemic cardiomyopathy: Secondary | ICD-10-CM | POA: Diagnosis not present

## 2019-02-08 DIAGNOSIS — Z951 Presence of aortocoronary bypass graft: Secondary | ICD-10-CM | POA: Insufficient documentation

## 2019-02-08 DIAGNOSIS — M1990 Unspecified osteoarthritis, unspecified site: Secondary | ICD-10-CM | POA: Diagnosis not present

## 2019-02-08 DIAGNOSIS — I2510 Atherosclerotic heart disease of native coronary artery without angina pectoris: Secondary | ICD-10-CM | POA: Insufficient documentation

## 2019-02-08 DIAGNOSIS — N186 End stage renal disease: Secondary | ICD-10-CM | POA: Insufficient documentation

## 2019-02-08 DIAGNOSIS — Z79899 Other long term (current) drug therapy: Secondary | ICD-10-CM | POA: Diagnosis not present

## 2019-02-08 DIAGNOSIS — I120 Hypertensive chronic kidney disease with stage 5 chronic kidney disease or end stage renal disease: Secondary | ICD-10-CM | POA: Diagnosis not present

## 2019-02-08 DIAGNOSIS — Z7982 Long term (current) use of aspirin: Secondary | ICD-10-CM | POA: Diagnosis not present

## 2019-02-08 NOTE — Progress Notes (Signed)
Daily Session Note  Patient Details  Name: Jeremy [name] MRN: [id number] Date of Birth: 05-May-1955 Referring Provider:     Cardiac Rehab from 11/09/2018 in [facility] Cardiac [facility]hab  Referring Provider  Jeremy [name]      Encounter Date: 02/08/2019  Check In: Session Check In - 02/08/19 0813      Check-In   Supervising physician immediately available to respond to emergencies  See telemetry face sheet for immediately available ER MD    Location  [facility]-Cardiac & Pulmonary Rehab    Staff Present  Heath Lark, RN, BSN, CCRP;Joseph Foy [place], [place], ACSM CEP, Exercise Physiologist    Virtual Visit  No    Medication changes reported      No    Fall or balance concerns reported     No    Warm-up and Cool-down  Performed on first and last piece of equipment    Resistance Training Performed  Yes    VAD Patient?  No    PAD/SET Patient?  No      Pain Assessment   Currently in Pain?  No/denies          Social History   Tobacco Use  Smoking Status Never Smoker  Smokeless Tobacco Never Used    Goals Met:  Independence with exercise equipment Exercise tolerated well No report of cardiac concerns or symptoms  Goals Unmet:  Not Applicable  Comments: Pt able to follow exercise prescription today without complaint.  Will continue to monitor for progression.    Dr. Emily Filbert is Medical Director for [name].

## 2019-02-09 ENCOUNTER — Encounter: Payer: Self-pay | Admitting: *Deleted

## 2019-02-09 DIAGNOSIS — Z992 Dependence on renal dialysis: Secondary | ICD-10-CM | POA: Diagnosis not present

## 2019-02-09 DIAGNOSIS — D631 Anemia in chronic kidney disease: Secondary | ICD-10-CM | POA: Diagnosis not present

## 2019-02-09 DIAGNOSIS — N186 End stage renal disease: Secondary | ICD-10-CM | POA: Diagnosis not present

## 2019-02-09 DIAGNOSIS — Z951 Presence of aortocoronary bypass graft: Secondary | ICD-10-CM

## 2019-02-09 DIAGNOSIS — N2581 Secondary hyperparathyroidism of renal origin: Secondary | ICD-10-CM | POA: Diagnosis not present

## 2019-02-09 NOTE — Progress Notes (Signed)
Cardiac Individual Treatment Plan  Patient Details  Name: Jeremy [name] MRN: [id number] Date of Birth: [birth year] Referring Provider:     Cardiac Rehab from 11/09/2018 in [facility] Cardiac and Pulmonary Rehab  Referring Provider  [name]      Initial Encounter Date:    Cardiac Rehab from 11/09/2018 in [facility] Cardiac and Pulmonary Rehab  Date  11/09/18      Visit Diagnosis: S/P CABG x 3  Patient's Home Medications on Admission:  Current Outpatient Medications:  .  acetaminophen (TYLENOL) 500 MG tablet, Take 1,000 mg 2 (two) times daily as needed by mouth for moderate pain., Disp: , Rfl:  .  allopurinol (ZYLOPRIM) 300 MG tablet, Take 300 mg by mouth daily., Disp: , Rfl: 5 .  amiodarone (PACERONE) 200 MG tablet, Take 1 tablet (200 mg total) by mouth daily., Disp: 180 tablet, Rfl: 3 .  aspirin EC 81 MG EC tablet, Take 1 tablet (81 mg total) by mouth daily., Disp:  , Rfl:  .  AURYXIA 1 GM 210 MG(Fe) tablet, TAKE 2 TABLETS BY MOUTH THREE TIMES A DAY WITH MEALS, Disp: , Rfl:  .  cinacalcet (SENSIPAR) 30 MG tablet, Take 30 mg by mouth every other day. , Disp: , Rfl:  .  Darbepoetin Alfa (ARANESP) 100 MCG/0.5ML SOSY injection, Inject 0.5 mLs (100 mcg total) into the vein every Monday with hemodialysis., Disp: 4.2 mL, Rfl:  .  Dextrose-Fructose-Sod Citrate (NAUZENE PO), Take 1-2 tablets 2 (two) times daily as needed by mouth (nausea)., Disp: , Rfl:  .  folic acid-vitamin b complex-vitamin c-selenium-zinc (DIALYVITE) 3 MG TABS tablet, Take 1 tablet by mouth daily., Disp: , Rfl:  .  lidocaine-prilocaine (EMLA) cream, Apply 1 application topically as needed (for fistula)., Disp: , Rfl:  .  metoprolol tartrate (LOPRESSOR) 25 MG tablet, TAKE 1/2 TABLETS (12.5 MG TOTAL) BY MOUTH 2 (TWO) TIMES DAILY., Disp: 30 tablet, Rfl: 1 .  midodrine (PROAMATINE) 10 MG tablet, Take 10 mg by mouth 2 (two) times daily as needed (for low BP)., Disp: , Rfl:  .  omeprazole (PRILOSEC) 20 MG capsule, Take 20 mg by mouth daily., Disp:  , Rfl:  .  pravastatin (PRAVACHOL) 20 MG tablet, Take 1 tablet (20 mg total) by mouth every evening., Disp: 90 tablet, Rfl: 3 .  predniSONE (DELTASONE) 5 MG tablet, Take 5 mg by mouth daily with breakfast. , Disp: , Rfl:  .  sevelamer carbonate (RENVELA) 800 MG tablet, Take 40,000 mg by mouth 3 (three) times daily with meals. 5 tabs per meal, Disp: , Rfl:  .  warfarin (COUMADIN) 1 MG tablet, Take 1 tablet (1 mg total) by mouth daily at 6 PM. (Patient taking differently: Take 1-2 mg by mouth See admin instructions. 1 mg all days , except 2 mg on Sunday..* OR AS DIRECTED), Disp: 90 tablet, Rfl: 1  Past Medical History: Past Medical History:  Diagnosis Date  . 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 Aortic stenosis    a. 2014 s/p mech AVR ([facility])-->chronic coumadin; b. 08/2018 Echo: Mild AS/AI. Nl fxn/gradient.  . Arthritis   . Atrial fibrillation ([place])   . CAD (coronary artery disease)    a. 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 02/2018 MV ([facility]: large inf, infsept, inflat infarct w/ min rev, EF 30%; c. 06/2018 Cath: LM min irregs, LAD 60p, 95/30/1m, LCX min irregs, RCA 40p, 742m90d, RPAV 90, RPDA 95ost; c.  09/2018 CABGx3 (LIMA->LAD, VG->D1, VG->PDA).  . Chronic anticoagulation 06/2012   a. Coumadin in the setting of mech AVR and afib.  Marland Kitchen CPAP (continuous positive airway pressure) dependence   . Dialysis patient [facility]) 04/07/2006   Patient started HD around 1996,  had 1st transplant LLQ around 1998 until 2007 when they found renal cell cancer in transplant and both native kidneys, all were removed > went back on HD until 2nd transplant RLQ in 2014 which lasted 3 yrs; it was embolized for suspected acute rejection. Is currently on HD MWF in [place] w/ [facility] nephrology.  . ESRD (end stage renal disease) ([place])    a. 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 1999 s/p Kidney transplant; c. 2014 Bilat nephrectomy (transplanted  kidney resected) in the setting of renal cell carcinoma; d. PD until 11/2017-->HD.  Marland Kitchen Heart murmur   . HFrEF (heart failure with reduced ejection fraction) ([place])    a. 07/2016 Echo: EF 55-60%; b. 02/2018 MV: EF 30%; c. 04/2018 Echo: EF 35%; d. 08/2018 Echo: EF 30-35%. Glob HK w/o rwma. Mildly reduced RV fxn. Mod to sev dil LA. Nl AoV fxn.  Marland Kitchen Hx of colonoscopy    2009 by Dr. Laural Golden. Normal except for small submucosal lipoma. No evidence of polyps or colitis.  . Hyperlipidemia   . Hypertension   . Hypogonadism in male 07/25/2015  . Ischemic cardiomyopathy    a. 07/2016 Echo: EF 55-60%; b. 02/2018 MV: EF 30%; c. 04/2018 Echo: EF 35%; d. 08/2018 Echo: EF 30-35%.  . OSA (obstructive sleep apnea)    No CPAP  . Permanent atrial fibrillation    a. CHA2DS2VASc = 3-->Coumadin (also mech AVR); b. 06/2018 Amio started 2/2 elevated rates.  . Renal cell cancer ([place])   . Renal cell carcinoma ([place]) 08/04/2016    Tobacco Use: Social History   Tobacco Use  Smoking Status Never Smoker  Smokeless Tobacco Never Used    Labs: Recent Review Flowsheet Data    Labs for ITP Cardiac and Pulmonary Rehab Latest Ref Rng & Units 09/16/2018 09/16/2018 09/16/2018 09/16/2018 09/16/2018   Cholestrol 0 - 200 mg/dL - - - - -   LDLCALC 0 - 99 mg/dL - - - - -   LDLDIRECT mg/dL - - - - -   HDL >39.00 mg/dL - - - - -   Trlycerides 0.0 - 149.0 mg/dL - - - - -   Hemoglobin A1c 4.8 - 5.6 % - - - - -   PHART 7.350 - 7.450 7.393 7.390 7.375 7.329(L) -   PCO2ART 32.0 - 48.0 mmHg 45.2 43.1 44.0 48.1(H) -   HCO3 20.0 - 28.0 mmol/L 27.6 26.3 25.7 25.2 -   TCO2 22 - 32 mmol/L '29 28 27 27 24   ' ACIDBASEDEF 0.0 - 2.0 mmol/L - - - 1.0 -   O2SAT % 98.0 98.0 99.0 99.0 -       Exercise Target Goals: Exercise Program Goal: Individual exercise prescription set using results from initial 6 min walk test and THRR while considering  patient's activity barriers and safety.   Exercise Prescription Goal: Initial exercise prescription builds to  30-45 minutes a day of aerobic activity, 2-3 days per week.  Home exercise guidelines will be given to patient during program as part of exercise prescription that the participant will acknowledge.  Activity Barriers & Risk Stratification: Activity Barriers & Cardiac Risk Stratification - 11/04/18 1054      Activity Barriers & Cardiac Risk Stratification  Activity Barriers  Joint Problems;Muscular Weakness    Cardiac Risk Stratification  High       6 Minute Walk: 6 Minute Walk    Row Name 11/09/18 1543         6 Minute Walk   Distance  1288 feet     Walk Time  6 minutes     # of Rest Breaks  0     MPH  2.44     METS  3.6     RPE  11     VO2 Peak  12.6     Symptoms  No     Resting HR  74 bpm     Resting BP  122/60     Max Ex. HR  116 bpm     Max Ex. BP  134/62     2 Minute Post BP  112/62        Oxygen Initial Assessment:   Oxygen Re-Evaluation:   Oxygen Discharge (Final Oxygen Re-Evaluation):   Initial Exercise Prescription: Initial Exercise Prescription - 11/09/18 1500      Date of Initial Exercise RX and Referring Provider   Date  11/09/18    Referring Provider  [name]      Treadmill   MPH  2.5    Grade  2    Minutes  15    METs  3.6      NuStep   Level  4    SPM  80    Minutes  15    METs  3.6      REL-XR   Level  3    Speed  50    Minutes  15    METs  3.6      Prescription Details   Frequency (times per week)  2    Duration  Progress to 30 minutes of continuous aerobic without signs/symptoms of physical distress      Intensity   THRR 40-80% of Max Heartrate  115-144    Ratings of Perceived Exertion  11-15    Perceived Dyspnea  0-4      Progression   Progression  Continue to progress workloads to maintain intensity without signs/symptoms of physical distress.      Resistance Training   Training Prescription  Yes    Weight  3 lb    Reps  10-15       Perform Capillary Blood Glucose checks as needed.  Exercise Prescription  Changes: Exercise Prescription Changes    Row Name 11/10/18 1200 11/18/18 0800 11/26/18 0800 11/30/18 1500 12/15/18 1100     Response to Exercise   Blood Pressure (Admit)  122/60  -  138/68  132/60  114/58   Blood Pressure (Exercise)  134/62  -  150/80  138/68  118/60   Blood Pressure (Exit)  112/62  -  104/60  114/60  110/56   Heart Rate (Admit)  74 bpm  -  76 bpm  83 bpm  90 bpm   Heart Rate (Exercise)  116 bpm  -  111 bpm  115 bpm  111 bpm   Heart Rate (Exit)  83 bpm  -  89 bpm  89 bpm  89 bpm   Rating of Perceived Exertion (Exercise)  11  -  '13  13  14   ' Symptoms  none  -  none  none  none   Duration  -  -  Progress to 30 minutes of  aerobic without signs/symptoms of physical  distress  Continue with 30 min of aerobic exercise without signs/symptoms of physical distress.  Continue with 30 min of aerobic exercise without signs/symptoms of physical distress.   Intensity  -  -  THRR unchanged  THRR unchanged  THRR unchanged     Progression   Progression  -  -  Continue to progress workloads to maintain intensity without signs/symptoms of physical distress.  Continue to progress workloads to maintain intensity without signs/symptoms of physical distress.  Continue to progress workloads to maintain intensity without signs/symptoms of physical distress.   Average METs  -  -  2.9  2.8  2.8     Resistance Training   Training Prescription  -  -  Yes  Yes  Yes   Weight  -  -  '4  4  4    ' Reps  -  -  10-15  10-15  10-15     Interval Training   Interval Training  -  -  No  -  No     Treadmill   MPH  -  -  2.5  -  -   Grade  -  -  2  -  -   Minutes  -  -  15  -  -   METs  -  -  3.6  -  -     NuStep   Level  -  -  '4  5  5   ' SPM  -  -  50  50  50   Minutes  -  -  '15  15  15   ' METs  -  -  2.8  3.6  3.6     REL-XR   Level  -  -  1.'5  2  2   ' Speed  -  -  50  50  50   Minutes  -  -  '15  15  15   ' METs  -  -  1.9  1.9  1.9     Home Exercise Plan   Plans to continue exercise at  -  Home  (comment) walking  Home (comment) walking  Home (comment) walking  Home (comment) walking   Frequency  -  Add 2 additional days to program exercise sessions.  Add 2 additional days to program exercise sessions.  Add 2 additional days to program exercise sessions.  Add 2 additional days to program exercise sessions.   Initial Home Exercises Provided  -  11/18/18  11/18/18  11/18/18  11/18/18   Row Name 01/19/19 1000 01/26/19 1200           Response to Exercise   Blood Pressure (Admit)  124/60  110/60      Blood Pressure (Exercise)  140/68  124/68      Blood Pressure (Exit)  134/58  108/58      Heart Rate (Admit)  90 bpm  83 bpm      Heart Rate (Exercise)  112 bpm  113 bpm      Heart Rate (Exit)  96 bpm  93 bpm      Rating of Perceived Exertion (Exercise)  14  16      Symptoms  none  noone      Duration  Continue with 30 min of aerobic exercise without signs/symptoms of physical distress.  Continue with 30 min of aerobic exercise without signs/symptoms of physical distress.      Intensity  THRR unchanged  THRR unchanged  Progression   Progression  Continue to progress workloads to maintain intensity without signs/symptoms of physical distress.  Continue to progress workloads to maintain intensity without signs/symptoms of physical distress.      Average METs  3  3        Resistance Training   Training Prescription  Yes  Yes      Weight  6 lbs  6 lb      Reps  10-15  10-15        Interval Training   Interval Training  No  No        Treadmill   MPH  2.5  -      Grade  2.5  -      Minutes  15  -      METs  3.78  -        NuStep   Level  6  6      Minutes  15  15      METs  3.4  3.5        REL-XR   Level  2  2.3      Minutes  15  15      METs  1.8  2.4        Home Exercise Plan   Plans to continue exercise at  Home (comment) walking  Home (comment) walking      Frequency  Add 2 additional days to program exercise sessions.  Add 2 additional days to program exercise  sessions.      Initial Home Exercises Provided  11/18/18  11/18/18         Exercise Comments: Exercise Comments    Row Name 11/11/18 0840 11/16/18 0828 12/09/18 0912       Exercise Comments  First full day of exercise!  Patient was oriented to gym and equipment including functions, settings, policies, and procedures.  Patient's individual exercise prescription and treatment plan were reviewed.  All starting workloads were established based on the results of the 6 minute walk test done at initial orientation visit.  The plan for exercise progression was also introduced and progression will be customized based on patient's performance and goals.  Andru stated that after he went home last week his chest was very sore.  Remains sore today. He was advised to decrease or not use weights today.  Also to call his doctor if pain continues  Bryker had lower BP today after dialysis yesterday        Exercise Goals and Review: Exercise Goals    Row Name 11/09/18 1548             Exercise Goals   Increase Physical Activity  Yes       Expected Outcomes  Short Term: Attend rehab on a regular basis to increase amount of physical activity.;Long Term: Add in home exercise to make exercise part of routine and to increase amount of physical activity.;Long Term: Exercising regularly at least 3-5 days a week.       Increase Strength and Stamina  Yes       Intervention  Provide advice, education, support and counseling about physical activity/exercise needs.;Develop an individualized exercise prescription for aerobic and resistive training based on initial evaluation findings, risk stratification, comorbidities and participant's personal goals.       Expected Outcomes  Short Term: Increase workloads from initial exercise prescription for resistance, speed, and METs.;Short Term: Perform resistance training exercises routinely during rehab and add in resistance  training at home;Long Term: Improve cardiorespiratory  fitness, muscular endurance and strength as measured by increased METs and functional capacity (6MWT)       Able to understand and use rate of perceived exertion (RPE) scale  Yes       Intervention  Provide education and explanation on how to use RPE scale       Expected Outcomes  Short Term: Able to use RPE daily in rehab to express subjective intensity level;Long Term:  Able to use RPE to guide intensity level when exercising independently       Able to understand and use Dyspnea scale  Yes       Intervention  Provide education and explanation on how to use Dyspnea scale       Expected Outcomes  Short Term: Able to use Dyspnea scale daily in rehab to express subjective sense of shortness of breath during exertion;Long Term: Able to use Dyspnea scale to guide intensity level when exercising independently       Knowledge and understanding of Target Heart Rate Range (THRR)  Yes       Intervention  Provide education and explanation of THRR including how the numbers were predicted and where they are located for reference       Expected Outcomes  Short Term: Able to state/look up THRR;Short Term: Able to use daily as guideline for intensity in rehab;Long Term: Able to use THRR to govern intensity when exercising independently       Able to check pulse independently  Yes       Intervention  Provide education and demonstration on how to check pulse in carotid and radial arteries.;Review the importance of being able to check your own pulse for safety during independent exercise       Expected Outcomes  Short Term: Able to explain why pulse checking is important during independent exercise;Long Term: Able to check pulse independently and accurately       Understanding of Exercise Prescription  Yes       Intervention  Provide education, explanation, and written materials on patient's individual exercise prescription       Expected Outcomes  Short Term: Able to explain program exercise prescription;Long Term:  Able to explain home exercise prescription to exercise independently          Exercise Goals Re-Evaluation : Exercise Goals Re-Evaluation    Row Name 11/11/18 0840 11/18/18 0819 11/26/18 0816 11/30/18 1544 12/15/18 1120     Exercise Goal Re-Evaluation   Exercise Goals Review  Increase Physical Activity;Increase Strength and Stamina;Able to understand and use rate of perceived exertion (RPE) scale;Knowledge and understanding of Target Heart Rate Range (THRR);Understanding of Exercise Prescription  Increase Physical Activity;Increase Strength and Stamina;Able to understand and use rate of perceived exertion (RPE) scale;Knowledge and understanding of Target Heart Rate Range (THRR);Understanding of Exercise Prescription;Able to understand and use Dyspnea scale;Able to check pulse independently  Increase Physical Activity;Increase Strength and Stamina;Able to understand and use rate of perceived exertion (RPE) scale;Knowledge and understanding of Target Heart Rate Range (THRR);Understanding of Exercise Prescription  Increase Physical Activity;Increase Strength and Stamina;Able to understand and use rate of perceived exertion (RPE) scale;Able to understand and use Dyspnea scale;Knowledge and understanding of Target Heart Rate Range (THRR);Understanding of Exercise Prescription  Increase Physical Activity;Able to understand and use rate of perceived exertion (RPE) scale;Increase Strength and Stamina;Knowledge and understanding of Target Heart Rate Range (THRR);Able to check pulse independently;Understanding of Exercise Prescription   Comments  Reviewed RPE scale,  THR and program prescription with pt today.  Pt voiced understanding and was given a copy of goals to take home.  Reviewed home exercise with pt today.  Pt plans to walk at home for exercise.  Reviewed THR, pulse, RPE, sign and symptoms, NTG use, and when to call 911 or MD.  Also discussed weather considerations and indoor options.  Pt voiced  understanding.  Tommie is doing well in rehab so far.  He plans to increase levels at next session.  he attends 2 days per week due to dialysis on other days.  Kona increased levels on both machines today.  he is walking and doing strength work 2 days per week outside class.  Kiet attends consistently and works at appropriate RPE.  Staff will monitor progress.   Expected Outcomes  Short: Use RPE daily to regulate intensity. Long: Follow program prescription in THR.  Short: Start to add in walking on off days.  Long: Continue to follow program prescription  Short - continue to attend consistently Long :  increase overall MET level  Short - continue home exercise Long - maintain fitness independently  Short - continue to gradually build intensity Long - improve overall MET level   Row Name 01/18/19 1550 01/26/19 1242           Exercise Goal Re-Evaluation   Exercise Goals Review  Increase Physical Activity;Increase Strength and Stamina;Understanding of Exercise Prescription  Increase Physical Activity;Increase Strength and Stamina;Able to understand and use rate of perceived exertion (RPE) scale;Knowledge and understanding of Target Heart Rate Range (THRR);Able to check pulse independently;Understanding of Exercise Prescription      Comments  Lakota continues to do well in rehab!  He is still making improvements!  We will continue to monitor his progress.  Joaquim has increased to 6 lb for strength and works at [employer].  He does exercise on days he doesnt go to dialysis.      Expected Outcomes  Short: Continue to increase workloads.  Long: Continue to improve stamina.  Short - continue to exercise at home Long - increase overall MET level         Discharge Exercise Prescription (Final Exercise Prescription Changes): Exercise Prescription Changes - 01/26/19 1200      Response to Exercise   Blood Pressure (Admit)  110/60    Blood Pressure (Exercise)  124/68    Blood Pressure (Exit)  108/58    Heart Rate  (Admit)  83 bpm    Heart Rate (Exercise)  113 bpm    Heart Rate (Exit)  93 bpm    Rating of Perceived Exertion (Exercise)  16    Symptoms  noone    Duration  Continue with 30 min of aerobic exercise without signs/symptoms of physical distress.    Intensity  THRR unchanged      Progression   Progression  Continue to progress workloads to maintain intensity without signs/symptoms of physical distress.    Average METs  3      Resistance Training   Training Prescription  Yes    Weight  6 lb    Reps  10-15      Interval Training   Interval Training  No      NuStep   Level  6    Minutes  15    METs  3.5      REL-XR   Level  2.3    Minutes  15    METs  2.4  Home Exercise Plan   Plans to continue exercise at  Home (comment)   walking   Frequency  Add 2 additional days to program exercise sessions.    Initial Home Exercises Provided  11/18/18       Nutrition:  Target Goals: Understanding of nutrition guidelines, daily intake of sodium <1584m, cholesterol <2050m calories 30% from fat and 7% or less from saturated fats, daily to have 5 or more servings of fruits and vegetables.  Biometrics: Pre Biometrics - 11/09/18 1549      Pre Biometrics   Height  5' 9.75" (1.772 m)    Weight  188 lb 8 oz (85.5 kg)    BMI (Calculated)  27.23    Single Leg Stand  7.06 seconds        Nutrition Therapy Plan and Nutrition Goals: Nutrition Therapy & Goals - 12/07/18 0812      Personal Nutrition Goals   Comments  Pt reported in the hospital (renal and HH diet) his fluid retention dropped and his stomach was not as "bloated". Pt has renal diettian follwoing him; went over CKD on dialysis basics and provided handouts. Went over gout basics and provided handout (pt reports organ meats, steak and pork chops are his most noticible triggers and is on medication). Pt is on coumadin and continues to eat a consistent amount of leafy greens (working with doctor). Discussed Hh eating in the  context of his other issues, discussed how it will be a balancing act and we may have to fine tune things. Pt reports protein is WNL since including eggs, he takes his renal vitamin after dialysis, and he is on a phosphate binder. Pt reports eating cheerios or oatmeal on non-dialysis days and nothing on dialysis days for B. L both days is eggs with tuKu[place]acon. S grits and peanut butter. D chicken or fish and vegtables. Pt reports sometimes eating fried food but cut out fried foods and red meat for the most part. Discussed low Na eating. Pt is also mindful of the amount of liquids he consumes (usually water, tea, or juice).       Nutrition Assessments: Nutrition Assessments - 11/11/18 1138      MEDFICTS Scores   Pre Score  43       Nutrition Goals Re-Evaluation: Nutrition Goals Re-Evaluation    Row Name 12/07/18 0809 12/21/18 0856 02/08/19 1133         Goals   Current Weight  188 lb (85.3 kg)  190 lb (86.2 kg)  -     Nutrition Goal  ST: +1 serving of F/V/day (now having 2/day) LT: gain strength and get back to doing what he was doing before, reducing fluid retention.  ST: continue renal diet LT: gain strength and get back to doing what he was doing before, reducing fluid retention.  ST: continue renal diet LT: gain strength and get back to doing what he was doing before, reducing fluid retention.     Comment  starting him on vitamin D. Energy has been good, but having issues with joints (knees and hips) - not sure if medications - sent message to cardiologist. Told him to refer to the renal handout I gave him to see fruits and vegetables that work with his renal diet. Pt to have report card from dialysis this week. Pt reports energy is good. Pt now limits fried foods and instead uses shake n' bake for chicken and BBQ on pork chops baked - advised to just be mindful  of the salt in BBQ sauce and shake n' Bake. Pt reports dry weight is 85 kg.  Pt reports per dialysis report card his K+ was up, but  is now coming back down. He believes it was the nacho basket he got from [employer] with tomatoes and beans. Pt reports being tired of the renal diet after being on dialysis so long and expressed frustration, but acceptance. Encouraged pt that he was doing a good job. Pt reports eating the foods less desired on the renal and HH diet in moderation (ex. 1 piece of bacon instead of all they give yoyu when eating out). Pt reports not needing anything from RD at this time.  Continue with current changes     Expected Outcome  Manage edema, dialysis, and gout.  Manage edema, dialysis, and gout.  Manage edema, dialysis, and gout.        Nutrition Goals Discharge (Final Nutrition Goals Re-Evaluation): Nutrition Goals Re-Evaluation - 02/08/19 1133      Goals   Nutrition Goal  ST: continue renal diet LT: gain strength and get back to doing what he was doing before, reducing fluid retention.    Comment  Continue with current changes    Expected Outcome  Manage edema, dialysis, and gout.       Psychosocial: Target Goals: Acknowledge presence or absence of significant depression and/or stress, maximize coping skills, provide positive support system. Participant is able to verbalize types and ability to use techniques and skills needed for reducing stress and depression.   Initial Review & Psychosocial Screening: Initial Psych Review & Screening - 11/04/18 1049      Initial Review   Current issues with  Current Stress Concerns    Source of Stress Concerns  Chronic Illness    Comments  Makell is on HD M,W,F. He has a good support system and is handling it well. He stays on top of managing his health, but it gets exhausting at times.      Family Dynamics   Good Support System?  Yes      Barriers   Psychosocial barriers to participate in program  There are no identifiable barriers or psychosocial needs.      Screening Interventions   Interventions  Encouraged to exercise;Provide feedback about the scores  to participant    Expected Outcomes  Short Term goal: Utilizing psychosocial counselor, staff and physician to assist with identification of specific Stressors or current issues interfering with healing process. Setting desired goal for each stressor or current issue identified.;Long Term Goal: Stressors or current issues are controlled or eliminated.;Short Term goal: Identification and review with participant of any Quality of Life or Depression concerns found by scoring the questionnaire.;Long Term goal: The participant improves quality of Life and PHQ9 Scores as seen by post scores and/or verbalization of changes       Quality of Life Scores:  Quality of Life - 11/11/18 1138      Quality of Life   Select  Quality of Life      Quality of Life Scores   Health/Function Pre  20.73 %    Socioeconomic Pre  19 %    Psych/Spiritual Pre  20.86 %    Family Pre  20.6 %    GLOBAL Pre  20.38 %      Scores of 19 and below usually indicate a poorer quality of life in these areas.  A difference of  2-3 points is a clinically meaningful difference.  A difference  of 2-3 points in the total score of the Quality of Life Index has been associated with significant improvement in overall quality of life, self-image, physical symptoms, and general health in studies assessing change in quality of life.  PHQ-9: Recent Review Flowsheet Data    Depression screen [facility] 2/9 11/09/2018 03/19/2018   Decreased Interest 0 0   Down, Depressed, Hopeless 0 0   PHQ - 2 Score 0 0   Altered sleeping 0 -   Tired, decreased energy 0 -   Change in appetite 0 -   Feeling bad or failure about yourself  0 -   Trouble concentrating 0 -   Moving slowly or fidgety/restless 0 -   Suicidal thoughts 0 -   PHQ-9 Score 0 -   Difficult doing work/chores Not difficult at all -     Interpretation of Total Score  Total Score Depression Severity:  1-4 = Minimal depression, 5-9 = Mild depression, 10-14 = Moderate depression, 15-19 =  Moderately severe depression, 20-27 = Severe depression   Psychosocial Evaluation and Intervention:   Psychosocial Re-Evaluation: Psychosocial Re-Evaluation    Row Name 12/23/18 0810 01/25/19 7425           Psychosocial Re-Evaluation   Current issues with  Current Stress Concerns;Current Sleep Concerns  Current Stress Concerns      Comments  Saahas says he has no current stress or sleep concerns  Frazier still has no stress concerns  He keeps his mind off stress by doing other activities like working on cars.      Expected Outcomes  Short - continue exercise Long - manage stress long term  Short - continue hobbies to manage stress Long - keep stress levels low      Interventions  Encouraged to attend Cardiac Rehabilitation for the exercise  -         Psychosocial Discharge (Final Psychosocial Re-Evaluation): Psychosocial Re-Evaluation - 01/25/19 0814      Psychosocial Re-Evaluation   Current issues with  Current Stress Concerns    Comments  Sheriff still has no stress concerns  He keeps his mind off stress by doing other activities like working on cars.    Expected Outcomes  Short - continue hobbies to manage stress Long - keep stress levels low       Vocational Rehabilitation: Provide vocational rehab assistance to qualifying candidates.   Vocational Rehab Evaluation & Intervention: Vocational Rehab - 11/04/18 1048      Initial Vocational Rehab Evaluation & Intervention   Assessment shows need for Vocational Rehabilitation  No       Education: Education Goals: Education classes will be provided on a variety of topics geared toward better understanding of heart health and risk factor modification. Participant will state understanding/return demonstration of topics presented as noted by education test scores.  Learning Barriers/Preferences: Learning Barriers/Preferences - 11/04/18 1048      Learning Barriers/Preferences   Learning Barriers  None    Learning Preferences  None        Education Topics:  AED/CPR: - Group verbal and written instruction with the use of models to demonstrate the basic use of the AED with the basic ABC's of resuscitation.   General Nutrition Guidelines/Fats and Fiber: -Group instruction provided by verbal, written material, models and posters to present the general guidelines for heart healthy nutrition. Gives an explanation and review of dietary fats and fiber.   Controlling Sodium/Reading Food Labels: -Group verbal and written material supporting the discussion of sodium  use in heart healthy nutrition. Review and explanation with models, verbal and written materials for utilization of the food label.   Exercise Physiology & General Exercise Guidelines: - Group verbal and written instruction with models to review the exercise physiology of the cardiovascular system and associated critical values. Provides general exercise guidelines with specific guidelines to those with heart or lung disease.    Aerobic Exercise & Resistance Training: - Gives group verbal and written instruction on the various components of exercise. Focuses on aerobic and resistive training programs and the benefits of this training and how to safely progress through these programs..   Flexibility, Balance, Mind/Body Relaxation: Provides group verbal/written instruction on the benefits of flexibility and balance training, including mind/body exercise modes such as yoga, pilates and tai chi.  Demonstration and skill practice provided.   Stress and Anxiety: - Provides group verbal and written instruction about the health risks of elevated stress and causes of high stress.  Discuss the correlation between heart/lung disease and anxiety and treatment options. Review healthy ways to manage with stress and anxiety.   Depression: - Provides group verbal and written instruction on the correlation between heart/lung disease and depressed mood, treatment options, and the  stigmas associated with seeking treatment.   Anatomy & Physiology of the Heart: - Group verbal and written instruction and models provide basic cardiac anatomy and physiology, with the coronary electrical and arterial systems. Review of Valvular disease and Heart Failure   Cardiac Procedures: - Group verbal and written instruction to review commonly prescribed medications for heart disease. Reviews the medication, class of the drug, and side effects. Includes the steps to properly store meds and maintain the prescription regimen. (beta blockers and nitrates)   Cardiac Medications I: - Group verbal and written instruction to review commonly prescribed medications for heart disease. Reviews the medication, class of the drug, and side effects. Includes the steps to properly store meds and maintain the prescription regimen.   Cardiac Medications II: -Group verbal and written instruction to review commonly prescribed medications for heart disease. Reviews the medication, class of the drug, and side effects. (all other drug classes)    Go Sex-Intimacy & Heart Disease, Get SMART - Goal Setting: - Group verbal and written instruction through game format to discuss heart disease and the return to sexual intimacy. Provides group verbal and written material to discuss and apply goal setting through the application of the S.M.A.R.T. Method.   Other Matters of the Heart: - Provides group verbal, written materials and models to describe Stable Angina and Peripheral Artery. Includes description of the disease process and treatment options available to the cardiac patient.   Exercise & Equipment Safety: - Individual verbal instruction and demonstration of equipment use and safety with use of the equipment.   Cardiac Rehab from 11/09/2018 in [facility] Cardiac and Pulmonary Rehab  Date  11/09/18  Educator  AS  Instruction Review Code  1- Verbalizes Understanding      Infection Prevention: - Provides  verbal and written material to individual with discussion of infection control including proper hand washing and proper equipment cleaning during exercise session.   Cardiac Rehab from 11/09/2018 in [facility] Cardiac and Pulmonary Rehab  Date  11/09/18  Educator  AS  Instruction Review Code  1- Verbalizes Understanding      Falls Prevention: - Provides verbal and written material to individual with discussion of falls prevention and safety.   Cardiac Rehab from 11/09/2018 in [facility] Cardiac and Pulmonary Rehab  Date  11/09/18  Educator  AS  Instruction Review Code  1- Verbalizes Understanding      Diabetes: - Individual verbal and written instruction to review signs/symptoms of diabetes, desired ranges of glucose level fasting, after meals and with exercise. Acknowledge that pre and post exercise glucose checks will be done for 3 sessions at entry of program.   Know Your Numbers and Risk Factors: -Group verbal and written instruction about important numbers in your health.  Discussion of what are risk factors and how they play a role in the disease process.  Review of Cholesterol, Blood Pressure, Diabetes, and BMI and the role they play in your overall health.   Sleep Hygiene: -Provides group verbal and written instruction about how sleep can affect your health.  Define sleep hygiene, discuss sleep cycles and impact of sleep habits. Review good sleep hygiene tips.    Other: -Provides group and verbal instruction on various topics (see comments)   Knowledge Questionnaire Score: Knowledge Questionnaire Score - 11/11/18 1138      Knowledge Questionnaire Score   Pre Score  21/26  Angina, Exercise.Depression missed       Core Components/Risk Factors/Patient Goals at Admission: Personal Goals and Risk Factors at Admission - 11/09/18 1552      Core Components/Risk Factors/Patient Goals on Admission    Weight Management  Weight Maintenance    Heart Failure  Yes    Hypertension  Yes     Intervention  Provide education on lifestyle modifcations including regular physical activity/exercise, weight management, moderate sodium restriction and increased consumption of fresh fruit, vegetables, and low fat dairy, alcohol moderation, and smoking cessation.;Monitor prescription use compliance.    Expected Outcomes  Short Term: Continued assessment and intervention until BP is < 140/58m HG in hypertensive participants. < 130/835mHG in hypertensive participants with diabetes, heart failure or chronic kidney disease.;Long Term: Maintenance of blood pressure at goal levels.    Intervention  Provide education and support for participant on nutrition & aerobic/resistive exercise along with prescribed medications to achieve LDL <7043mHDL >23m59m  Expected Outcomes  Long Term: Cholesterol controlled with medications as prescribed, with individualized exercise RX and with personalized nutrition plan. Value goals: LDL < 70mg81mL > 40 mg.    Stress  Yes       Core Components/Risk Factors/Patient Goals Review:  Goals and Risk Factor Review    Row Name 11/30/18 0811 93237/20 [id number] 01/25/19 0809         Core Components/Risk Factors/Patient Goals Review   Personal Goals Review  Weight Management/Obesity;Develop more efficient breathing techniques such as purse lipped breathing and diaphragmatic breathing and practicing self-pacing with activity.;Hypertension;Lipids  Weight Management/Obesity;Lipids;Hypertension  Weight Management/Obesity;Lipids;Hypertension     Review  Cristan is taking meds as directed and measuring BP at home.  BP has been running normal at home and at HT.  [facility] has some knee and hip pain - was taking hydrochloriquine which helped but he cant take it at this time.  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  We discussed doing some ROM exercises to help with this.  He also takes Tylenol.  Zyen continues to take meds as directed.He had a cortisone shot for his knee and it helped briefly.  He spoke  with his PA about taking glucosamine and is going to try that.  tims knee is doing better - he feels like it is stronger and he can walk up stairs easier.  His hip hurts some - had an xray and didnt hear  anything back.  He is taking glucosamine and fish oil.  We reviewed ROM for hip.  He is setting up his total gym.     Expected Outcomes  Short - continue exercise two days not at HT - add ROM exercises Long - maintain overall exercise and health habits  Short - try glucosamine and stretches Long - maintain heart healthy habits  Short - add hip ROM exercises Long - continue to improve stamina and reduce knee and hip pain        Core Components/Risk Factors/Patient Goals at Discharge (Final Review):  Goals and Risk Factor Review - 01/25/19 0809      Core Components/Risk Factors/Patient Goals Review   Personal Goals Review  Weight Management/Obesity;Lipids;Hypertension    Review  tims knee is doing better - he feels like it is stronger and he can walk up stairs easier.  His hip hurts some - had an xray and didnt hear anything back.  He is taking glucosamine and fish oil.  We reviewed ROM for hip.  He is setting up his total gym.    Expected Outcomes  Short - add hip ROM exercises Long - continue to improve stamina and reduce knee and hip pain       ITP Comments: ITP Comments    Row Name 11/04/18 1055 11/09/18 1542 11/11/18 0840 11/16/18 0825 11/17/18 0548   ITP Comments  Virtual RN orientation completed. Diagnosis can be found in [facility] 6/8. EP/RD orientation scheduled for 8/4 at 1:30  Completed initial 6 MWT and nutrition consult.  Initial ITP created and sent to Dr Emily Filbert, Medical Director  First full day of exercise!  Patient was oriented to gym and equipment including functions, settings, policies, and procedures.  Patient's individual exercise prescription and treatment plan were reviewed.  All starting workloads were established based on the results of the 6 minute walk test done at initial  orientation visit.  The plan for exercise progression was also introduced and progression will be customized based on patient's performance and goals.  Makenzie stated that after he went home last week his chest was very sore.  Remains sore today. He was advised to decrease or not use weights today.  Also to call his doctor if pain continues.  30 Day Review Completed today. Continue with ITP unless changed by Medical Director review.   [place] Name 12/09/18 0911 12/15/18 0544 12/23/18 0805 02/09/19 0610     ITP Comments  Talha had lower BP today after dialysis yesterday  30 Day review. Continue with ITP unless directed changes per Medical Director review.  Pt states Dialysis has increased his dry weight by half a kilogram  30 day review completed. Continue with ITP sent to Dr. Emily Filbert, Medical Director of Cardiac and Pulmonary Rehab for review , changes as needed and signature.       Comments:

## 2019-02-11 DIAGNOSIS — N186 End stage renal disease: Secondary | ICD-10-CM | POA: Diagnosis not present

## 2019-02-11 DIAGNOSIS — N2581 Secondary hyperparathyroidism of renal origin: Secondary | ICD-10-CM | POA: Diagnosis not present

## 2019-02-11 DIAGNOSIS — D631 Anemia in chronic kidney disease: Secondary | ICD-10-CM | POA: Diagnosis not present

## 2019-02-11 DIAGNOSIS — Z992 Dependence on renal dialysis: Secondary | ICD-10-CM | POA: Diagnosis not present

## 2019-02-14 DIAGNOSIS — N186 End stage renal disease: Secondary | ICD-10-CM | POA: Diagnosis not present

## 2019-02-14 DIAGNOSIS — Z992 Dependence on renal dialysis: Secondary | ICD-10-CM | POA: Diagnosis not present

## 2019-02-14 DIAGNOSIS — D631 Anemia in chronic kidney disease: Secondary | ICD-10-CM | POA: Diagnosis not present

## 2019-02-14 DIAGNOSIS — N2581 Secondary hyperparathyroidism of renal origin: Secondary | ICD-10-CM | POA: Diagnosis not present

## 2019-02-15 ENCOUNTER — Other Ambulatory Visit: Payer: Self-pay

## 2019-02-15 ENCOUNTER — Encounter: Payer: Self-pay | Admitting: Cardiovascular Disease

## 2019-02-15 ENCOUNTER — Encounter: Payer: Medicare Other | Admitting: *Deleted

## 2019-02-15 DIAGNOSIS — Z951 Presence of aortocoronary bypass graft: Secondary | ICD-10-CM | POA: Diagnosis not present

## 2019-02-15 DIAGNOSIS — I5020 Unspecified systolic (congestive) heart failure: Secondary | ICD-10-CM | POA: Diagnosis not present

## 2019-02-15 DIAGNOSIS — I120 Hypertensive chronic kidney disease with stage 5 chronic kidney disease or end stage renal disease: Secondary | ICD-10-CM | POA: Diagnosis not present

## 2019-02-15 DIAGNOSIS — I2510 Atherosclerotic heart disease of native coronary artery without angina pectoris: Secondary | ICD-10-CM | POA: Diagnosis not present

## 2019-02-15 DIAGNOSIS — D631 Anemia in chronic kidney disease: Secondary | ICD-10-CM | POA: Diagnosis not present

## 2019-02-15 DIAGNOSIS — E785 Hyperlipidemia, unspecified: Secondary | ICD-10-CM | POA: Diagnosis not present

## 2019-02-15 LAB — POCT INR: INR: 2.4 (ref 2.0–3.0)

## 2019-02-15 LAB — LAB REPORT - SCANNED: INR: 2.4

## 2019-02-15 NOTE — Progress Notes (Signed)
Daily Session Note  Patient Details  Name: Jeremy [name] MRN: [id number] Date of Birth: 05/29/1955 Referring Provider:     Cardiac Rehab from 11/09/2018 in [facility] Cardiac and Pulmonary Rehab  Referring Provider  [name]      Encounter Date: 02/15/2019  Check In: Session Check In - 02/15/19 0804      Check-In   Supervising physician immediately available to respond to emergencies  See telemetry face sheet for immediately available ER MD    Location  [facility]-Cardiac & Pulmonary Rehab    Staff Present  Heath Lark, RN, BSN, CCRP;Joseph Hood RCP,RRT,BSRT;Jessica [place], [place], [place], [place], CCET    Virtual Visit  No    Medication changes reported      No    Fall or balance concerns reported     No    Warm-up and Cool-down  Performed on first and last piece of equipment    Resistance Training Performed  Yes    VAD Patient?  No    PAD/SET Patient?  No      Pain Assessment   Currently in Pain?  No/denies          Social History   Tobacco Use  Smoking Status Never Smoker  Smokeless Tobacco Never Used    Goals Met:  Independence with exercise equipment Exercise tolerated well No report of cardiac concerns or symptoms  Goals Unmet:  Not Applicable  Comments: Pt able to follow exercise prescription today without complaint.  Will continue to monitor for progression.    Dr. Emily Filbert is Medical Director for [place] and [facility].

## 2019-02-16 ENCOUNTER — Ambulatory Visit (INDEPENDENT_AMBULATORY_CARE_PROVIDER_SITE_OTHER): Payer: Medicare Other

## 2019-02-16 DIAGNOSIS — Z952 Presence of prosthetic heart valve: Secondary | ICD-10-CM | POA: Diagnosis not present

## 2019-02-16 DIAGNOSIS — D631 Anemia in chronic kidney disease: Secondary | ICD-10-CM | POA: Diagnosis not present

## 2019-02-16 DIAGNOSIS — I4821 Permanent atrial fibrillation: Secondary | ICD-10-CM

## 2019-02-16 DIAGNOSIS — N186 End stage renal disease: Secondary | ICD-10-CM | POA: Diagnosis not present

## 2019-02-16 DIAGNOSIS — Z992 Dependence on renal dialysis: Secondary | ICD-10-CM | POA: Diagnosis not present

## 2019-02-16 DIAGNOSIS — N2581 Secondary hyperparathyroidism of renal origin: Secondary | ICD-10-CM | POA: Diagnosis not present

## 2019-02-16 DIAGNOSIS — Z5181 Encounter for therapeutic drug level monitoring: Secondary | ICD-10-CM | POA: Diagnosis not present

## 2019-02-16 NOTE — Patient Instructions (Signed)
Spoke w/ pt and advised him to take 2 tablets tonight, then continue dosage of 1 TABLET EVERY DAY EXCEPT 1/2 TABLET ON FRIDAYS.  Recheck INR in 1 week - call [phone] w/ results.

## 2019-02-17 ENCOUNTER — Other Ambulatory Visit: Payer: Self-pay

## 2019-02-17 ENCOUNTER — Encounter: Payer: Medicare Other | Admitting: *Deleted

## 2019-02-17 DIAGNOSIS — Z951 Presence of aortocoronary bypass graft: Secondary | ICD-10-CM | POA: Diagnosis not present

## 2019-02-17 DIAGNOSIS — E785 Hyperlipidemia, unspecified: Secondary | ICD-10-CM | POA: Diagnosis not present

## 2019-02-17 DIAGNOSIS — D631 Anemia in chronic kidney disease: Secondary | ICD-10-CM | POA: Diagnosis not present

## 2019-02-17 DIAGNOSIS — I120 Hypertensive chronic kidney disease with stage 5 chronic kidney disease or end stage renal disease: Secondary | ICD-10-CM | POA: Diagnosis not present

## 2019-02-17 DIAGNOSIS — I5020 Unspecified systolic (congestive) heart failure: Secondary | ICD-10-CM | POA: Diagnosis not present

## 2019-02-17 DIAGNOSIS — I2510 Atherosclerotic heart disease of native coronary artery without angina pectoris: Secondary | ICD-10-CM | POA: Diagnosis not present

## 2019-02-17 NOTE — Progress Notes (Signed)
Daily Session Note  Patient Details  Name: ARISH REDNER MRN: [id number] Date of Birth: 06/25/1955 Referring Provider:     Cardiac Rehab from 11/09/2018 in [facility] Cardiac and Pulmonary Rehab  Referring Provider  [name]      Encounter Date: 02/17/2019  Check In: Session Check In - 02/17/19 0805      Check-In   Supervising physician immediately available to respond to emergencies  See telemetry face sheet for immediately available ER MD    Location  [facility]-Cardiac & Pulmonary Rehab    Staff Present  Heath Lark, RN, BSN, CCRP;Jessica [place], MA, RCEP, CCRP, CCET    Virtual Visit  No    Medication changes reported      No    Fall or balance concerns reported     No    Warm-up and Cool-down  Performed on first and last piece of equipment    Resistance Training Performed  Yes    VAD Patient?  No    PAD/SET Patient?  No      Pain Assessment   Currently in Pain?  No/denies          Social History   Tobacco Use  Smoking Status Never Smoker  Smokeless Tobacco Never Used    Goals Met:  Independence with exercise equipment Exercise tolerated well No report of cardiac concerns or symptoms  Goals Unmet:  Not Applicable  Comments: Pt able to follow exercise prescription today without complaint.  Will continue to monitor for progression.    Dr. Emily Filbert is Medical Director for [place] and [facility].

## 2019-02-18 DIAGNOSIS — N186 End stage renal disease: Secondary | ICD-10-CM | POA: Diagnosis not present

## 2019-02-18 DIAGNOSIS — N2581 Secondary hyperparathyroidism of renal origin: Secondary | ICD-10-CM | POA: Diagnosis not present

## 2019-02-18 DIAGNOSIS — Z992 Dependence on renal dialysis: Secondary | ICD-10-CM | POA: Diagnosis not present

## 2019-02-18 DIAGNOSIS — D631 Anemia in chronic kidney disease: Secondary | ICD-10-CM | POA: Diagnosis not present

## 2019-02-21 ENCOUNTER — Ambulatory Visit (INDEPENDENT_AMBULATORY_CARE_PROVIDER_SITE_OTHER): Payer: Medicare Other

## 2019-02-21 DIAGNOSIS — Z5181 Encounter for therapeutic drug level monitoring: Secondary | ICD-10-CM | POA: Diagnosis not present

## 2019-02-21 DIAGNOSIS — Z952 Presence of prosthetic heart valve: Secondary | ICD-10-CM | POA: Diagnosis not present

## 2019-02-21 DIAGNOSIS — N2581 Secondary hyperparathyroidism of renal origin: Secondary | ICD-10-CM | POA: Diagnosis not present

## 2019-02-21 DIAGNOSIS — I4821 Permanent atrial fibrillation: Secondary | ICD-10-CM | POA: Diagnosis not present

## 2019-02-21 DIAGNOSIS — Z992 Dependence on renal dialysis: Secondary | ICD-10-CM | POA: Diagnosis not present

## 2019-02-21 DIAGNOSIS — N186 End stage renal disease: Secondary | ICD-10-CM | POA: Diagnosis not present

## 2019-02-21 DIAGNOSIS — D631 Anemia in chronic kidney disease: Secondary | ICD-10-CM | POA: Diagnosis not present

## 2019-02-21 LAB — POCT INR: INR: 2.6 (ref 2.0–3.0)

## 2019-02-21 LAB — LAB REPORT - SCANNED: INR: 2.6

## 2019-02-21 NOTE — Patient Instructions (Signed)
Spoke w/ pt and advised him to continue dosage of 1 TABLET EVERY DAY EXCEPT 1/2 TABLET ON FRIDAYS.  Recheck INR in 1 week.

## 2019-02-21 NOTE — Progress Notes (Signed)
02/22/2019 10:46 AM   Jeremy [name] [birth year] [id number]  Referring provider: Leone Haven, MD [address] [place],  [place] [address]  Chief Complaint  Patient presents with   Follow-up    HPI: 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  History of RCC Has recently transferred care from [facility] to [facility] and seen by Dr. Janese Banks.  Has follow up imaging this month.    ESRD First transplant in 1999.  He was found to have Schu[place] in the transplanted kidney and in his bilateral native kidneys.  He underwent native bilateral nephrectomy and renal transplant in 2007.  His next transplant was in 2014.  On dialysis. Followed by [facility].   [facility] denied transplant.  Waiting on [facility] in [place].    Fluctuating PSA PSA Trend  2.18 in 07/2016  2.45 in 04/2017  3.12 in 04/2017  Component     Latest Ref Rng & Units 08/26/2017 09/29/2017 01/01/2018 08/31/2018  Prostate Specific Ag, Serum     0.0 - 4.0 ng/mL 3.4 3.0 2.4 5.1 (H)   Component     Latest Ref Rng & Units 11/04/2018  Prostate Specific Ag, Serum     0.0 - 4.0 ng/mL 3.3   Testosterone deficiency Not currently on therapy.    Discharge Resolved.  Left hydrocele He states that the left hydrocele has become bothersome will a constant dull ache and would like to be considered for a left hydrocelectomy.     ED Patient has not responded to PDE 5 inhibitors.  He also is no longer having spontaneous erections.  He would like to know what other options he may have in regards to erectile dysfunction.  PMH: Past Medical History:  Diagnosis Date   Anemia in chronic kidney disease 07/04/2015   Aortic stenosis    a. 2014 s/p mech AVR ([facility])-->chronic coumadin; b. 08/2018 Echo: Mild AS/AI. Nl fxn/gradient.    Arthritis    Atrial fibrillation (HCC)    CAD (coronary artery disease)    a. 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 02/2018 [facility]): large inf, infsept, inflat infarct w/ min rev, EF 30%; c. 06/2018 Cath: LM min irregs, LAD 60p, 95/30/27m, LCX min irregs, RCA 40p, 42m, 90d, RPAV 90, RPDA 95ost; c. 09/2018 CABGx3 (LIMA->LAD, VG->D1, VG->PDA).   Chronic anticoagulation 06/2012   a. Coumadin in the setting of mech AVR and afib.   CPAP (continuous positive airway pressure) dependence    Dialysis patient ([place]) 04/07/2006   Patient started HD around 1996,  had 1st transplant LLQ around [place] until 2007 when they found renal cell cancer in transplant and both native kidneys, all were removed > went back on HD until 2nd transplant RLQ in 2014 which lasted 3 yrs; it was embolized for suspected acute rejection. Is currently on HD MWF in [place] w/ [facility] nephrology.   ESRD (end stage renal disease) ([place])    a. 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 1999 s/p Kidney transplant; c. 2014 Bilat nephrectomy (transplanted kidney resected) in the setting of renal cell carcinoma; d. PD until 11/2017-->HD.   Heart murmur    HFrEF (heart failure with reduced ejection fraction) (  [place])    a. 07/2016 Echo: EF 55-60%; b. 02/2018 MV: EF 30%; c. 04/2018 Echo: EF 35%; d. 08/2018 Echo: EF 30-35%. Glob HK w/o rwma. Mildly reduced RV fxn. Mod to sev dil LA. Nl AoV fxn.   Hx of colonoscopy    2009 by Dr. Laural Golden. Normal except for small submucosal lipoma. No evidence of polyps or colitis.   Hyperlipidemia    Hypertension    Hypogonadism in male 07/25/2015   Ischemic cardiomyopathy    a. 07/2016 Echo: EF 55-60%; b. 02/2018 MV: EF 30%; c. 04/2018 Echo: EF 35%; d. 08/2018 Echo: EF 30-35%.   OSA (obstructive sleep apnea)    No CPAP   Permanent atrial fibrillation (HCC)    a. CHA2DS2VASc = 3-->Coumadin (also mech AVR); b. 06/2018 Amio started 2/2 elevated  rates.   Renal cell cancer ([place])    Renal cell carcinoma ([place]) 08/04/2016    Surgical History: Past Surgical History:  Procedure Laterality Date   AORTIC VALVE REPLACEMENT  3 /11 /2014   At [facility]     CAPD INSERTION N/A 02/19/2017   Procedure: LAPAROSCOPIC INSERTION CONTINUOUS AMBULATORY PERITONEAL DIALYSIS  (CAPD) CATHETER;  Surgeon: Algernon Huxley, MD;  Location: [facility];  Service: General;  Laterality: N/A;   CORONARY ANGIOPLASTY WITH STENT PLACEMENT     CORONARY ARTERY BYPASS GRAFT N/A 09/16/2018   Procedure: CORONARY ARTERY BYPASS GRAFT times three      with left internal mammary artery and endoharvest of right leg greater saphenous vein;  Surgeon: Melrose Nakayama, MD;  Location: [facility];  Service: Open Heart Surgery;  Laterality: N/A;   DG AV DIALYSIS  SHUNT ACCESS EXIST*L* OR     left arm   EPIGASTRIC HERNIA REPAIR N/A 02/19/2017   Procedure: HERNIA REPAIR EPIGASTRIC ADULT;  Surgeon: Robert Bellow, MD;  Location: [facility];  Service: General;  Laterality: N/A;   EYE SURGERY     bilateral cataract   HERNIA REPAIR     UMBILICAL HERNIA   HERNIA REPAIR  02/19/2017   INNER EAR SURGERY     20 years ago   KIDNEY TRANSPLANT     Peritoneal Dialysis access placement  02/19/2017   REMOVAL OF A DIALYSIS CATHETER N/A 11/23/2017   Procedure: REMOVAL OF A PERITONEAL DIALYSIS CATHETER;  Surgeon: Algernon Huxley, MD;  Location: [facility];  Service: Vascular;  Laterality: N/A;   RIGHT/LEFT HEART CATH AND CORONARY ANGIOGRAPHY Bilateral 06/07/2018   Procedure: RIGHT/LEFT HEART CATH AND CORONARY ANGIOGRAPHY;  Surgeon: Wellington Hampshire, MD;  Location: [facility];  Service: Cardiovascular;  Laterality: Bilateral;   TEE WITHOUT CARDIOVERSION N/A 07/21/2016   Procedure: TRANSESOPHAGEAL ECHOCARDIOGRAM (TEE);  Surgeon: Wellington Hampshire, MD;  Location: [facility];  Service: Cardiovascular;  Laterality: N/A;   TEE WITHOUT  CARDIOVERSION N/A 09/16/2018   Procedure: TRANSESOPHAGEAL ECHOCARDIOGRAM (TEE);  Surgeon: Melrose Nakayama, MD;  Location: [facility];  Service: Open Heart Surgery;  Laterality: N/A;    Home Medications:  Allergies as of 02/22/2019      Reactions   Statins    Other reaction(s): Arthralgias (intolerance), Myalgias (intolerance) Pt taking pravastatin    Sertraline Rash      Medication List       Accurate as of February 22, 2019 10:46 AM. If you have any questions, ask your nurse or doctor.        acetaminophen 500 MG tablet Commonly known as: TYLENOL Take 1,000 mg  2 (two) times daily as needed by mouth for moderate pain.   allopurinol 300 MG tablet Commonly known as: ZYLOPRIM Take 300 mg by mouth daily.   amiodarone 200 MG tablet Commonly known as: PACERONE Take 1 tablet (200 mg total) by mouth daily.   aspirin 81 MG EC tablet Take 1 tablet (81 mg total) by mouth daily.   Auryxia 1 GM 210 MG(Fe) tablet Generic drug: ferric citrate TAKE 2 TABLETS BY MOUTH THREE TIMES A DAY WITH MEALS   cinacalcet 30 MG tablet Commonly known as: SENSIPAR Take 30 mg by mouth every other day.   Darbepoetin Alfa 100 MCG/0.5ML Sosy injection Commonly known as: ARANESP Inject 0.5 mLs (100 mcg total) into the vein every Monday with hemodialysis.   folic acid-vitamin b complex-vitamin c-selenium-zinc 3 MG Tabs tablet Take 1 tablet by mouth daily.   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   metoprolol tartrate 25 MG tablet Commonly known as: LOPRESSOR TAKE 1/2 TABLETS (12.5 MG TOTAL) BY MOUTH 2 (TWO) TIMES DAILY.   midodrine 10 MG tablet Commonly known as: PROAMATINE Take 10 mg by mouth 2 (two) times daily as needed (for low BP).   NAUZENE PO Take 1-2 tablets 2 (two) times daily as needed by mouth (nausea).   omeprazole 20 MG capsule Commonly known as: PRILOSEC Take 20 mg by mouth daily.   pravastatin 20 MG  tablet Commonly known as: PRAVACHOL Take 1 tablet (20 mg total) by mouth every evening.   predniSONE 5 MG tablet Commonly known as: DELTASONE Take 5 mg by mouth daily with breakfast.   sevelamer carbonate 800 MG tablet Commonly known as: RENVELA Take 40,000 mg by mouth 3 (three) times daily with meals. 5 tabs per meal   warfarin 1 MG tablet Commonly known as: COUMADIN Take as directed by the anticoagulation clinic. If you are unsure how to take this medication, talk to your nurse or doctor. Original instructions: Take 1 tablet (1 mg total) by mouth daily at 6 PM. What changed:   how much to take  when to take this  additional instructions       Allergies:  Allergies  Allergen Reactions   Statins     Other reaction(s): Arthralgias (intolerance), Myalgias (intolerance)  Pt taking pravastatin    Sertraline Rash    Family History: Family History  Problem Relation Age of Onset   Cancer Mother        pancreatic   Heart disease Father    Diabetes Father     Social History:  reports that he has never smoked. He has never used smokeless tobacco. He reports that he does not drink alcohol or use drugs.  ROS: UROLOGY Frequent Urination?: No Hard to postpone urination?: No Burning/pain with urination?: No Get up at night to urinate?: No Leakage of urine?: No Urine stream starts and stops?: No Trouble starting stream?: No Do you have to strain to urinate?: No Blood in urine?: No Urinary tract infection?: No Sexually transmitted disease?: No Injury to kidneys or bladder?: No Painful intercourse?: No Weak stream?: No Erection problems?: Yes Penile pain?: No  Gastrointestinal Nausea?: No Vomiting?: No Indigestion/heartburn?: No Diarrhea?: No Constipation?: No  Constitutional Fever: No Night sweats?: No Weight loss?: No Fatigue?: No  Skin Skin rash/lesions?: No Itching?: No  Eyes Blurred vision?: No Double vision?: No  Ears/Nose/Throat Sore  throat?: No Sinus problems?: No  Hematologic/Lymphatic Swollen glands?: No Easy bruising?: Yes  Cardiovascular Leg swelling?:  No Chest pain?: No  Respiratory Cough?: No Shortness of breath?: No  Endocrine Excessive thirst?: No  Musculoskeletal Back pain?: No Joint pain?: Yes  Neurological Headaches?: No Dizziness?: No  Psychologic Depression?: No Anxiety?: No  Physical Exam: BP 118/68    Pulse 90    Ht 5\' 10"  (1.778 m)    Wt 191 lb (86.6 kg)    BMI 27.41 kg/m   Constitutional:  Well nourished. Alert and oriented, No acute distress. HEENT: [place] AT, moist mucus membranes.  Trachea midline, no masses. Cardiovascular: No clubbing, cyanosis, or edema. Respiratory: Normal respiratory effort, no increased work of breathing. GI: Abdomen is soft, non tender, non distended, no abdominal masses. Liver and spleen not palpable.  No hernias appreciated.  Stool sample for occult testing is not indicated.   GU: No CVA tenderness.  No bladder fullness or masses.  Patient with circumcised phallus. Urethral meatus is patent.  No penile discharge. No penile lesions or rashes. Scrotum without lesions, cysts, rashes and/or edema.  Large left hydrocele is noted.  Right testicle is located scrotally. No masses are appreciated in the right testicle. Right epididymis is normal.  Rectal: Patient with  normal sphincter tone. Anus and perineum without scarring or rashes. No rectal masses are appreciated. Prostate is approximately 50 grams, could only palpate the apex and midportion of gland, no nodules are appreciated. Seminal vesicles could not be palpated.   Skin: No rashes, bruises or suspicious lesions. Lymph: No inguinal adenopathy. Neurologic: Grossly intact, no focal deficits, moving all 4 extremities. Psychiatric: Normal mood and affect.  Laboratory Data: Lab Results  Component Value Date   WBC 5.3 11/29/2018   HGB 11.6 (L) 11/29/2018   HCT 37.0 (L) 11/29/2018   MCV 106.3 (H) 11/29/2018     PLT 139 (L) 11/29/2018    Lab Results  Component Value Date   CREATININE 5.46 (H) 11/29/2018    Lab Results  Component Value Date   PSA 2.18 07/07/2016    Lab Results  Component Value Date   TESTOSTERONE 200 (L) 05/06/2017    Lab Results  Component Value Date   HGBA1C 5.9 (H) 09/15/2018    Urinalysis    Component Value Date/Time   COLORURINE AMBER (A) 07/17/2016 1531   APPEARANCEUR CLOUDY (A) 07/17/2016 1531   LABSPEC 1.030 07/17/2016 1531   PHURINE 8.0 07/17/2016 1531   GLUCOSEU 50 (A) 07/17/2016 1531   HGBUR NEGATIVE 07/17/2016 1531   BILIRUBINUR NEGATIVE 07/17/2016 1531   KETONESUR NEGATIVE 07/17/2016 1531   PROTEINUR 100 (A) 07/17/2016 1531   NITRITE NEGATIVE 07/17/2016 1531   LEUKOCYTESUR NEGATIVE 07/17/2016 1531   I have reviewed the labs.  Assessment & Plan:    1. Hydrocele Patient is requesting a left hydrocelectomy, therefore I will order a scrotal ultrasound for further characterization of scrotal contents as I cannot palpate the left testicle.  He will then returned for a follow-up appointment with either Dr. Erlene Quan or Dr. Diamantina Providence as he has multiple comorbidities and a past history of RCC for scrotal ultrasound appointment and to discuss possible hydrocelectomy.    2. Fluctuating PSA  PSA pending  3. History of RCC Recently transferred care to [facility]   4. ESRD Mondays, Wednesday and Fridays for dialysis   5.  Erectile dysfunction We discussed intracavernosal injections, like Trimix Edex Caverject and the vacuum erectile device.  He stated he would like to think about the discussion and table it until after his hydrocelectomy.  Return for has an appointment  with Dr. Erlene Quan on 03/01/2019  .  Zara Council, PA-C  [facility] [address] [place], [place] [address] [phone]

## 2019-02-22 ENCOUNTER — Ambulatory Visit (INDEPENDENT_AMBULATORY_CARE_PROVIDER_SITE_OTHER): Payer: Medicare Other | Admitting: Urology

## 2019-02-22 ENCOUNTER — Other Ambulatory Visit: Payer: Self-pay | Admitting: Urology

## 2019-02-22 ENCOUNTER — Encounter: Payer: Self-pay | Admitting: Urology

## 2019-02-22 ENCOUNTER — Other Ambulatory Visit: Payer: Self-pay

## 2019-02-22 ENCOUNTER — Encounter: Payer: Medicare Other | Admitting: *Deleted

## 2019-02-22 VITALS — BP 118/68 | HR 90 | Ht 70.0 in | Wt 191.0 lb

## 2019-02-22 DIAGNOSIS — Z951 Presence of aortocoronary bypass graft: Secondary | ICD-10-CM | POA: Diagnosis not present

## 2019-02-22 DIAGNOSIS — N186 End stage renal disease: Secondary | ICD-10-CM

## 2019-02-22 DIAGNOSIS — I2510 Atherosclerotic heart disease of native coronary artery without angina pectoris: Secondary | ICD-10-CM

## 2019-02-22 DIAGNOSIS — D631 Anemia in chronic kidney disease: Secondary | ICD-10-CM | POA: Diagnosis not present

## 2019-02-22 DIAGNOSIS — Z85528 Personal history of other malignant neoplasm of kidney: Secondary | ICD-10-CM | POA: Diagnosis not present

## 2019-02-22 DIAGNOSIS — N529 Male erectile dysfunction, unspecified: Secondary | ICD-10-CM

## 2019-02-22 DIAGNOSIS — Z87898 Personal history of other specified conditions: Secondary | ICD-10-CM

## 2019-02-22 DIAGNOSIS — I120 Hypertensive chronic kidney disease with stage 5 chronic kidney disease or end stage renal disease: Secondary | ICD-10-CM | POA: Diagnosis not present

## 2019-02-22 DIAGNOSIS — N432 Other hydrocele: Secondary | ICD-10-CM

## 2019-02-22 DIAGNOSIS — E785 Hyperlipidemia, unspecified: Secondary | ICD-10-CM | POA: Diagnosis not present

## 2019-02-22 DIAGNOSIS — I5020 Unspecified systolic (congestive) heart failure: Secondary | ICD-10-CM | POA: Diagnosis not present

## 2019-02-22 NOTE — Progress Notes (Signed)
Daily Session Note  Patient Details  Name: Jeremy [name] MRN: [id number] Date of Birth: 05/19/1955 Referring Provider:     Cardiac Rehab from 11/09/2018 in [facility]  Referring Provider  [name]      Encounter Date: 02/22/2019  Check In: Session Check In - 02/22/19 0813      Check-In   Supervising physician immediately available to respond to emergencies  See telemetry face sheet for immediately available ER MD    Location  [facility]    Staff Present  Susanne Bice, RN, BSN, CCRP;Amanda Sommer, BA, ACSM CEP, Exercise Physiologist;Jessica Hawkins, MA, RCEP, CCRP, CCET    Virtual Visit  No    Medication changes reported      No    Fall or balance concerns reported     No    Warm-up and Cool-down  Performed on first and last piece of equipment    Resistance Training Performed  Yes    VAD Patient?  No    PAD/SET Patient?  No      Pain Assessment   Currently in Pain?  No/denies          Social History   Tobacco Use  Smoking Status Never Smoker  Smokeless Tobacco Never Used    Goals Met:  Independence with exercise equipment Exercise tolerated well No report of cardiac concerns or symptoms  Goals Unmet:  Not Applicable  Comments: Pt able to follow exercise prescription today without complaint.  Will continue to monitor for progression.    Dr. Mark Miller is Medical Director for [facility] and [facility]. 

## 2019-02-23 DIAGNOSIS — Z992 Dependence on renal dialysis: Secondary | ICD-10-CM | POA: Diagnosis not present

## 2019-02-23 DIAGNOSIS — N2581 Secondary hyperparathyroidism of renal origin: Secondary | ICD-10-CM | POA: Diagnosis not present

## 2019-02-23 DIAGNOSIS — N186 End stage renal disease: Secondary | ICD-10-CM | POA: Diagnosis not present

## 2019-02-23 DIAGNOSIS — D631 Anemia in chronic kidney disease: Secondary | ICD-10-CM | POA: Diagnosis not present

## 2019-02-23 LAB — PSA: Prostate Specific Ag, Serum: 3.3 ng/mL (ref 0.0–4.0)

## 2019-02-24 ENCOUNTER — Encounter: Payer: Medicare Other | Admitting: *Deleted

## 2019-02-24 ENCOUNTER — Other Ambulatory Visit: Payer: Self-pay

## 2019-02-24 DIAGNOSIS — E785 Hyperlipidemia, unspecified: Secondary | ICD-10-CM | POA: Diagnosis not present

## 2019-02-24 DIAGNOSIS — I120 Hypertensive chronic kidney disease with stage 5 chronic kidney disease or end stage renal disease: Secondary | ICD-10-CM | POA: Diagnosis not present

## 2019-02-24 DIAGNOSIS — Z951 Presence of aortocoronary bypass graft: Secondary | ICD-10-CM | POA: Diagnosis not present

## 2019-02-24 DIAGNOSIS — D631 Anemia in chronic kidney disease: Secondary | ICD-10-CM | POA: Diagnosis not present

## 2019-02-24 DIAGNOSIS — I5020 Unspecified systolic (congestive) heart failure: Secondary | ICD-10-CM | POA: Diagnosis not present

## 2019-02-24 DIAGNOSIS — I2510 Atherosclerotic heart disease of native coronary artery without angina pectoris: Secondary | ICD-10-CM | POA: Diagnosis not present

## 2019-02-24 NOTE — Progress Notes (Signed)
Daily Session Note  Patient Details  Name: Jeremy [name] MRN: [id number] Date of Birth: [birth year] Referring Provider:     Cardiac Rehab from 11/09/2018 in [facility]  Referring Provider  [name]      Encounter Date: 02/24/2019  Check In: Session Check In - 02/24/19 0808      Check-In   Supervising physician immediately available to respond to emergencies  See telemetry face sheet for immediately available ER MD    Location  [facility]    Staff Present  Heath Lark, RN, BSN, CCRP;Laureen Owens Shark, BS, RRT, CPFT;Jeanna Durrell BS, Exercise Physiologist    Virtual Visit  No    Medication changes reported      No    Fall or balance concerns reported     No    Warm-up and Cool-down  Performed on first and last piece of equipment    Resistance Training Performed  Yes    VAD Patient?  No    PAD/SET Patient?  No      Pain Assessment   Currently in Pain?  No/denies          Social History   Tobacco Use  Smoking Status Never Smoker  Smokeless Tobacco Never Used    Goals Met:  Independence with exercise equipment Exercise tolerated well No report of cardiac concerns or symptoms  Goals Unmet:  Not Applicable  Comments: Pt able to follow exercise prescription today without complaint.  Will continue to monitor for progression.    Dr. Emily Filbert is Medical Director for [facility] and [facility].

## 2019-02-25 ENCOUNTER — Ambulatory Visit: Payer: Medicare Other | Admitting: Cardiovascular Disease

## 2019-02-25 DIAGNOSIS — N186 End stage renal disease: Secondary | ICD-10-CM | POA: Diagnosis not present

## 2019-02-25 DIAGNOSIS — Z992 Dependence on renal dialysis: Secondary | ICD-10-CM | POA: Diagnosis not present

## 2019-02-25 DIAGNOSIS — D631 Anemia in chronic kidney disease: Secondary | ICD-10-CM | POA: Diagnosis not present

## 2019-02-25 DIAGNOSIS — N2581 Secondary hyperparathyroidism of renal origin: Secondary | ICD-10-CM | POA: Diagnosis not present

## 2019-02-25 NOTE — Progress Notes (Deleted)
Cardiology Office Note   Date:  02/25/2019   ID:  Jeremy [name], DOB 09/23/1955, MRN [id number]  PCP:  Leone Haven, MD  Cardiologist:   Kathlyn Sacramento, MD   No chief complaint on file.     History of Present Illness: Jeremy [name] is a 63 y.o. male who is here today for follow-up visit regarding coronary artery disease.   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 Cardiac catheterization in 2013 showed nonobstructive RCA and left circumflex disease.  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  LAD had 85% mid stenosis which was treated with a 2.75 x 28 mm Xience drug-eluting stent. 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  He had an echocardiogram done in January 2020 which showed a drop in ejection fraction from normal to 35%, moderately reduced RV function, moderate biatrial enlargement, St. Jude mechanical aortic valve with mean gradient of 21 mmHg, mild mitral regurgitation and mild pulmonary hypertension.  He was on peritoneal dialysis before and was switched to hemodialysis in August 2019 due to ineffective fluid removal.   The patient reported significant worsening of angina during my initial evaluation late last year.  He had a [place] Myoview done in November 2019 at [facility] which was abnormal high risk study with large defect in the inferior, inferoseptal and inferolateral myocardium which was fixed with minimal reversibility.  EF was 30%.  His previous EF was normal.  The patient was placed on Imdur with no improvement in his symptoms. I proceeded with a right and left cardiac  catheterization in March, 2010 which showed severe heavily calcified two-vessel coronary artery disease involving the LAD and right coronary artery which were not optimal for atherectomy given significant tortuosity and diffuse disease.  Right heart catheterization showed mildly elevated filling pressures, moderate pulmonary hypertension and normal cardiac output.  We had difficulty controlling his rate with A. fib and was placed on amiodarone for rate control.  He ultimately underwent CABG x3 in June of this year by Dr. Roxan Hockey.  He had hypotension post surgery especially with dialysis.  He was started on midodrine.  He has been doing extremely well with significant improvement in angina.  He used to take multiple nitroglycerin daily before surgery but he has not used any sublingual nitroglycerin since CABG.  He is attending cardiac rehab.  He is no longer hypotensive and midodrine was discontinued.    Past Medical History:  Diagnosis Date  . 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 Aortic stenosis    a. 2014 s/p mech AVR ([facility])-->chronic coumadin; b. 08/2018 Echo: Mild AS/AI. Nl fxn/gradient.  . Arthritis   . Atrial fibrillation ([place])   . CAD (coronary artery disease)    a. 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 02/2018 MV [name]): large inf, infsept, inflat infarct w/ min rev, EF 30%; c. 06/2018 Cath: LM min irregs, LAD 60p, 95/30/72m, LCX min irregs, RCA 40p, 81m, 90d, RPAV 90, RPDA 95ost; c. 09/2018 CABGx3 (LIMA->LAD, VG->D1, VG->PDA).  . Chronic anticoagulation 06/2012   a. Coumadin in the setting  of mech AVR and afib.  Marland Kitchen CPAP (continuous positive airway pressure) dependence   . Dialysis patient [facility]) 04/07/2006   Patient started HD around 1996,  had 1st transplant LLQ around 1998 until 2007 when they found renal cell cancer in transplant and both native kidneys, all were removed > went back on HD until 2nd transplant RLQ in  2014 which lasted 3 yrs; it was embolized for suspected acute rejection. Is currently on HD MWF in [place] w/ [facility] nephrology.  . ESRD (end stage renal disease) ([place])    a. CKD 2/2 to IgA nephropathy (dx age 82); b. 1999 s/p Kidney transplant; c. 2014 Bilat nephrectomy (transplanted kidney resected) in the setting of renal cell carcinoma; d. PD until 11/2017-->HD.  Marland Kitchen Heart murmur   . HFrEF (heart failure with reduced ejection fraction) ([place])    a. 07/2016 Echo: EF 55-60%; b. 02/2018 MV: EF 30%; c. 04/2018 Echo: EF 35%; d. 08/2018 Echo: EF 30-35%. Glob HK w/o rwma. Mildly reduced RV fxn. Mod to sev dil LA. Nl AoV fxn.  Marland Kitchen Hx of colonoscopy    2009 by Dr. Laural Golden. Normal except for small submucosal lipoma. No evidence of polyps or colitis.  . Hyperlipidemia   . Hypertension   . Hypogonadism in male 07/25/2015  . Ischemic cardiomyopathy    a. 07/2016 Echo: EF 55-60%; b. 02/2018 MV: EF 30%; c. 04/2018 Echo: EF 35%; d. 08/2018 Echo: EF 30-35%.  . OSA (obstructive sleep apnea)    No CPAP  . Permanent atrial fibrillation ([facility])    a. CHA2DS2VASc = 3-->Coumadin (also mech AVR); b. 06/2018 Amio started 2/2 elevated rates.  . Renal cell cancer ([place])   . Renal cell carcinoma ([place]) 08/04/2016    Past Surgical History:  Procedure Laterality Date  . AORTIC VALVE REPLACEMENT  3 /11 /2014   At [place]    . CAPD INSERTION N/A 02/19/2017   Procedure: LAPAROSCOPIC INSERTION CONTINUOUS AMBULATORY PERITONEAL DIALYSIS  (CAPD) CATHETER;  Surgeon: Algernon Huxley, MD;  Location: [facility] ORS;  Service: General;  Laterality: N/A;  . CORONARY ANGIOPLASTY WITH STENT PLACEMENT    . CORONARY ARTERY BYPASS GRAFT N/A 09/16/2018   Procedure: CORONARY ARTERY BYPASS GRAFT times three      with left internal mammary artery and endoharvest of right leg greater saphenous vein;  Surgeon: Melrose Nakayama, MD;  Location: [place];  Service: Open Heart Surgery;  Laterality: N/A;  . DG AV  DIALYSIS  SHUNT ACCESS EXIST*L* OR     left arm  . EPIGASTRIC HERNIA REPAIR N/A 02/19/2017   Procedure: HERNIA REPAIR EPIGASTRIC ADULT;  Surgeon: Robert Bellow, MD;  Location: [facility] ORS;  Service: General;  Laterality: N/A;  . EYE SURGERY     bilateral cataract  . HERNIA REPAIR     UMBILICAL HERNIA  . HERNIA REPAIR  02/19/2017  . INNER EAR SURGERY     20 years ago  . KIDNEY TRANSPLANT    . Peritoneal Dialysis access placement  02/19/2017  . REMOVAL OF A DIALYSIS CATHETER N/A 11/23/2017   Procedure: REMOVAL OF A PERITONEAL DIALYSIS CATHETER;  Surgeon: Algernon Huxley, MD;  Location: [facility] OR[facility];  Service: Vascular;  Laterality: N/A;  . RIGHT/LEFT HEART CATH AND CORONARY ANGIOGRAPHY Bilateral 06/07/2018   Procedure: RIGHT/LEFT HEART CATH AND CORONARY ANGIOGRAPHY;  Surgeon: Wellington Hampshire, MD;  Location: [place] [facility];  Service: Cardiovascular;  Laterality: Bilateral;  . TEE WITHOUT CARDIOVERSION N/A  07/21/2016   Procedure: TRANSESOPHAGEAL ECHOCARDIOGRAM (TEE);  Surgeon: Wellington Hampshire, MD;  Location: [facility] OR[facility];  Service: Cardiovascular;  Laterality: N/A;  . TEE WITHOUT CARDIOVERSION N/A 09/16/2018   Procedure: TRANSESOPHAGEAL ECHOCARDIOGRAM (TEE);  Surgeon: Melrose Nakayama, MD;  Location: [place];  Service: Open Heart Surgery;  Laterality: N/A;     Current Outpatient Medications  Medication Sig Dispense Refill  . acetaminophen (TYLENOL) 500 MG tablet Take 1,000 mg 2 (two) times daily as needed by mouth for moderate pain.    Marland Kitchen allopurinol (ZYLOPRIM) 300 MG tablet Take 300 mg by mouth daily.  5  . amiodarone (PACERONE) 200 MG tablet Take 1 tablet (200 mg total) by mouth daily. 180 tablet 3  . aspirin EC 81 MG EC tablet Take 1 tablet (81 mg total) by mouth daily.    Lorin Picket 1 GM 210 MG(Fe) tablet TAKE 2 TABLETS BY MOUTH THREE TIMES A DAY WITH MEALS    . cinacalcet (SENSIPAR) 30 MG tablet Take 30 mg by mouth every other day.     . Darbepoetin Alfa (ARANESP) 100 MCG/0.5ML SOSY  injection Inject 0.5 mLs (100 mcg total) into the vein every Monday with hemodialysis. 4.2 mL   . Dextrose-Fructose-Sod Citrate (NAUZENE PO) Take 1-2 tablets 2 (two) times daily as needed by mouth (nausea).    . folic acid-vitamin b complex-vitamin c-selenium-zinc (DIALYVITE) 3 MG TABS tablet Take 1 tablet by mouth daily.    Marland Kitchen lidocaine-prilocaine (EMLA) cream Apply 1 application topically as needed (for fistula).    . metoprolol tartrate (LOPRESSOR) 25 MG tablet TAKE 1/2 TABLETS (12.5 MG TOTAL) BY MOUTH 2 (TWO) TIMES DAILY. 30 tablet 1  . midodrine (PROAMATINE) 10 MG tablet Take 10 mg by mouth 2 (two) times daily as needed (for low BP).    Marland Kitchen omeprazole (PRILOSEC) 20 MG capsule Take 20 mg by mouth daily.    . pravastatin (PRAVACHOL) 20 MG tablet Take 1 tablet (20 mg total) by mouth every evening. 90 tablet 3  . predniSONE (DELTASONE) 5 MG tablet Take 5 mg by mouth daily with breakfast.     . sevelamer carbonate (RENVELA) 800 MG tablet Take 40,000 mg by mouth 3 (three) times daily with meals. 5 tabs per meal    . warfarin (COUMADIN) 1 MG tablet Take 1 tablet (1 mg total) by mouth daily at 6 PM. (Patient taking differently: Take 1-2 mg by mouth See admin instructions. 1 mg all days , except 2 mg on Sunday..* OR AS DIRECTED) 90 tablet 1   No current facility-administered medications for this visit.     Allergies:   Statins and Sertraline    Social History:  The patient  reports that he has never smoked. He has never used smokeless tobacco. He reports that he does not drink alcohol or use drugs.   Family History:  The patient's family history includes Cancer in his mother; Diabetes in his father; Heart disease in his father.    ROS:  Please see the history of present illness.   Otherwise, review of systems are positive for none.   All other systems are reviewed and negative.    PHYSICAL EXAM: VS:  There were no vitals taken for this visit. , BMI There is no height or weight on file to  calculate BMI. GEN: Well nourished, well developed, in no acute distress  HEENT: normal  Neck: Mild JVD, no carotid bruits, or masses Cardiac: Irregularly irregular; normal mechanical heart sounds with 1 out of 6 systolic  murmur at the aortic area . Respiratory:  clear to auscultation bilaterally, normal work of breathing GI: soft, nontender, nondistended, + BS MS: no deformity or atrophy  Skin: warm and dry, no rash Neuro:  Strength and sensation are intact Psych: euthymic mood, full affect Vascular: The patient has a fistula in the left arm.    EKG:  EKG is ordered today. The ekg ordered today demonstrates atrial fibrillation with ventricular rate of 79 Bpm.  Right bundle branch block with inferior T wave changes suggestive of ischemia.   Recent Labs: 09/17/2018: Magnesium 2.1 11/29/2018: ALT 18; BUN 19; Creatinine, Ser 5.46; Hemoglobin 11.6; Platelets 139; Sodium 140 12/15/2018: Potassium 5.7    Lipid Panel    Component Value Date/Time   CHOL 176 08/08/2016 0946   TRIG 158.0 (H) 08/08/2016 0946   HDL 50.50 08/08/2016 0946   CHOLHDL 3 08/08/2016 0946   VLDL 31.6 08/08/2016 0946   LDLCALC 94 08/08/2016 0946   LDLDIRECT 61.0 08/11/2017 1447      Wt Readings from Last 3 Encounters:  02/22/19 191 lb (86.6 kg)  11/29/18 188 lb 3.2 oz (85.4 kg)  11/23/18 188 lb (85.3 kg)       No flowsheet data found.    ASSESSMENT AND PLAN:  1.  Coronary artery disease involving native coronary arteries without angina: Status post CABG in June.  He is doing extremely well with significant improvement in symptoms.  He is attending cardiac rehab.  Continue current dose of metoprolol 12.5 mg twice daily.  2.  Chronic systolic heart failure: Most likely due to ischemic cardiomyopathy.  EF was 30 to 35%.  Continue small dose metoprolol for now.  He is not on an ACE inhibitor, ARB or other vasodilators due to low blood pressure.  I will consider repeat echocardiogram in 3 months to evaluate his  EF.  He is not a good candidate for an ICD placement given that he is on dialysis but might be a good candidate for a subcutaneous ICD.  3.  Chronic atrial fibrillation: He is on amiodarone mostly for rate control given relatively low blood pressure.  He is only able to take small dose of metoprolol.  My plan is to try to increase metoprolol during next visit and discontinue amiodarone to minimize the risk of long-term toxicity.  4.  Hyperlipidemia: Intolerance to statins due to myalgia.  He is tolerating rosuvastatin 10 mg daily.  5.  Mechanical aortic valve replacement: Continue long-term anticoagulation with warfarin.  6.  End-stage renal disease on hemodialysis Monday Wednesday and Friday.  Disposition:   FU with me in 3 months  Signed,  Kathlyn Sacramento, MD  02/25/2019 3:56 PM    [place]

## 2019-02-27 DIAGNOSIS — N2581 Secondary hyperparathyroidism of renal origin: Secondary | ICD-10-CM | POA: Diagnosis not present

## 2019-02-27 DIAGNOSIS — Z992 Dependence on renal dialysis: Secondary | ICD-10-CM | POA: Diagnosis not present

## 2019-02-27 DIAGNOSIS — D631 Anemia in chronic kidney disease: Secondary | ICD-10-CM | POA: Diagnosis not present

## 2019-02-27 DIAGNOSIS — N186 End stage renal disease: Secondary | ICD-10-CM | POA: Diagnosis not present

## 2019-02-28 ENCOUNTER — Other Ambulatory Visit: Payer: Self-pay

## 2019-02-28 ENCOUNTER — Ambulatory Visit: Payer: Medicare Other | Admitting: Oncology

## 2019-02-28 ENCOUNTER — Ambulatory Visit
Admission: RE | Admit: 2019-02-28 | Discharge: 2019-02-28 | Disposition: A | Discharge status: Home / Self Care | Payer: Medicare Other | Source: Ambulatory Visit | Attending: Urology | Admitting: Urology

## 2019-02-28 DIAGNOSIS — N433 Hydrocele, unspecified: Secondary | ICD-10-CM | POA: Diagnosis not present

## 2019-02-28 DIAGNOSIS — N432 Other hydrocele: Secondary | ICD-10-CM | POA: Diagnosis not present

## 2019-03-01 ENCOUNTER — Other Ambulatory Visit: Payer: Self-pay | Admitting: *Deleted

## 2019-03-01 ENCOUNTER — Ambulatory Visit: Payer: Medicare Other | Admitting: Urology

## 2019-03-01 ENCOUNTER — Encounter: Payer: Medicare Other | Admitting: *Deleted

## 2019-03-01 DIAGNOSIS — E785 Hyperlipidemia, unspecified: Secondary | ICD-10-CM | POA: Diagnosis not present

## 2019-03-01 DIAGNOSIS — I2510 Atherosclerotic heart disease of native coronary artery without angina pectoris: Secondary | ICD-10-CM | POA: Diagnosis not present

## 2019-03-01 DIAGNOSIS — C649 Malignant neoplasm of unspecified kidney, except renal pelvis: Secondary | ICD-10-CM

## 2019-03-01 DIAGNOSIS — I5020 Unspecified systolic (congestive) heart failure: Secondary | ICD-10-CM | POA: Diagnosis not present

## 2019-03-01 DIAGNOSIS — I120 Hypertensive chronic kidney disease with stage 5 chronic kidney disease or end stage renal disease: Secondary | ICD-10-CM | POA: Diagnosis not present

## 2019-03-01 DIAGNOSIS — N2581 Secondary hyperparathyroidism of renal origin: Secondary | ICD-10-CM | POA: Diagnosis not present

## 2019-03-01 DIAGNOSIS — N186 End stage renal disease: Secondary | ICD-10-CM | POA: Diagnosis not present

## 2019-03-01 DIAGNOSIS — D631 Anemia in chronic kidney disease: Secondary | ICD-10-CM | POA: Diagnosis not present

## 2019-03-01 DIAGNOSIS — Z85528 Personal history of other malignant neoplasm of kidney: Secondary | ICD-10-CM

## 2019-03-01 DIAGNOSIS — Z951 Presence of aortocoronary bypass graft: Secondary | ICD-10-CM | POA: Diagnosis not present

## 2019-03-01 DIAGNOSIS — Z992 Dependence on renal dialysis: Secondary | ICD-10-CM | POA: Diagnosis not present

## 2019-03-01 LAB — POCT INR: INR: 2.2 (ref 2.0–3.0)

## 2019-03-01 LAB — LAB REPORT - SCANNED: INR: 2.2

## 2019-03-01 NOTE — Progress Notes (Signed)
Daily Session Note  Patient Details  Name: Jeremy [name] MRN: [id number] Date of Birth: 09-13-1955 Referring Provider:     Cardiac Rehab from 11/09/2018 in [facility] Cardiac [facility]  Referring Provider  [name]      Encounter Date: 03/01/2019  Check In: Session Check In - 03/01/19 0804      Check-In   Supervising physician immediately available to respond to emergencies  See telemetry face sheet for immediately available ER MD    Location  [facility]-Cardiac & Pulmonary Rehab    Staff Present  Heath Lark, RN, BSN, CCRP;Jeanna Durrell BS, Exercise Physiologist;Amanda Oletta Darter, BA, ACSM CEP, Exercise Physiologist    Virtual Visit  No    Medication changes reported      No    Fall or balance concerns reported     No    Warm-up and Cool-down  Performed on first and last piece of equipment    Resistance Training Performed  Yes    VAD Patient?  No    PAD/SET Patient?  No      Pain Assessment   Currently in Pain?  No/denies          Social History   Tobacco Use  Smoking Status Never Smoker  Smokeless Tobacco Never Used    Goals Met:  Independence with exercise equipment Exercise tolerated well No report of cardiac concerns or symptoms  Goals Unmet:  Not Applicable  Comments: Pt able to follow exercise prescription today without complaint.  Will continue to monitor for progression.    Dr. Emily Filbert is Medical Director for [facility].

## 2019-03-02 ENCOUNTER — Ambulatory Visit
Admission: RE | Admit: 2019-03-02 | Discharge: 2019-03-02 | Disposition: A | Discharge status: Home / Self Care | Payer: Medicare Other | Source: Ambulatory Visit | Attending: Oncology | Admitting: Oncology

## 2019-03-02 ENCOUNTER — Other Ambulatory Visit: Payer: Self-pay

## 2019-03-02 ENCOUNTER — Ambulatory Visit (INDEPENDENT_AMBULATORY_CARE_PROVIDER_SITE_OTHER): Payer: Medicare Other

## 2019-03-02 DIAGNOSIS — I4821 Permanent atrial fibrillation: Secondary | ICD-10-CM

## 2019-03-02 DIAGNOSIS — Z952 Presence of prosthetic heart valve: Secondary | ICD-10-CM

## 2019-03-02 DIAGNOSIS — Z5181 Encounter for therapeutic drug level monitoring: Secondary | ICD-10-CM

## 2019-03-02 DIAGNOSIS — Z85528 Personal history of other malignant neoplasm of kidney: Secondary | ICD-10-CM

## 2019-03-02 DIAGNOSIS — K8020 Calculus of gallbladder without cholecystitis without obstruction: Secondary | ICD-10-CM | POA: Diagnosis not present

## 2019-03-02 NOTE — Patient Instructions (Signed)
Spoke w/ pt and advised him to take extra 1/2 tablet, then continue dosage of 1 TABLET EVERY DAY EXCEPT 1/2 TABLET ON FRIDAYS.  Recheck INR in 1 week.

## 2019-03-04 DIAGNOSIS — Z20828 Contact with and (suspected) exposure to other viral communicable diseases: Secondary | ICD-10-CM | POA: Diagnosis not present

## 2019-03-04 DIAGNOSIS — I4891 Unspecified atrial fibrillation: Secondary | ICD-10-CM | POA: Diagnosis not present

## 2019-03-04 DIAGNOSIS — D631 Anemia in chronic kidney disease: Secondary | ICD-10-CM | POA: Diagnosis not present

## 2019-03-04 DIAGNOSIS — N186 End stage renal disease: Secondary | ICD-10-CM | POA: Diagnosis not present

## 2019-03-04 DIAGNOSIS — N2581 Secondary hyperparathyroidism of renal origin: Secondary | ICD-10-CM | POA: Diagnosis not present

## 2019-03-04 DIAGNOSIS — Z992 Dependence on renal dialysis: Secondary | ICD-10-CM | POA: Diagnosis not present

## 2019-03-07 DIAGNOSIS — Z992 Dependence on renal dialysis: Secondary | ICD-10-CM | POA: Diagnosis not present

## 2019-03-07 DIAGNOSIS — N2581 Secondary hyperparathyroidism of renal origin: Secondary | ICD-10-CM | POA: Diagnosis not present

## 2019-03-07 DIAGNOSIS — N186 End stage renal disease: Secondary | ICD-10-CM | POA: Diagnosis not present

## 2019-03-07 DIAGNOSIS — D631 Anemia in chronic kidney disease: Secondary | ICD-10-CM | POA: Diagnosis not present

## 2019-03-08 ENCOUNTER — Inpatient Hospital Stay: Payer: Medicare Other | Attending: Oncology | Admitting: Oncology

## 2019-03-08 ENCOUNTER — Encounter: Payer: Self-pay | Admitting: Oncology

## 2019-03-08 ENCOUNTER — Other Ambulatory Visit: Payer: Self-pay

## 2019-03-08 DIAGNOSIS — Z85528 Personal history of other malignant neoplasm of kidney: Secondary | ICD-10-CM | POA: Diagnosis not present

## 2019-03-08 DIAGNOSIS — I4821 Permanent atrial fibrillation: Secondary | ICD-10-CM | POA: Diagnosis not present

## 2019-03-08 DIAGNOSIS — I2510 Atherosclerotic heart disease of native coronary artery without angina pectoris: Secondary | ICD-10-CM

## 2019-03-08 LAB — POCT INR: INR: 2.9 (ref 2.0–3.0)

## 2019-03-08 NOTE — Progress Notes (Signed)
Patient stated that he had been doing well. Patient would like his CT scan results from 03/02/2019.

## 2019-03-09 ENCOUNTER — Ambulatory Visit (INDEPENDENT_AMBULATORY_CARE_PROVIDER_SITE_OTHER): Payer: Medicare Other

## 2019-03-09 ENCOUNTER — Encounter: Payer: Self-pay | Admitting: *Deleted

## 2019-03-09 DIAGNOSIS — I4821 Permanent atrial fibrillation: Secondary | ICD-10-CM

## 2019-03-09 DIAGNOSIS — Z952 Presence of prosthetic heart valve: Secondary | ICD-10-CM

## 2019-03-09 DIAGNOSIS — Z5181 Encounter for therapeutic drug level monitoring: Secondary | ICD-10-CM

## 2019-03-09 DIAGNOSIS — Z951 Presence of aortocoronary bypass graft: Secondary | ICD-10-CM

## 2019-03-09 NOTE — Progress Notes (Signed)
Cardiac Individual Treatment Plan  Patient Details  Name: Jeremy Jeremy [name] MRN: [id number] Date of Birth: 1956-04-06 Referring Provider:     Cardiac Rehab from 11/09/2018 in [facility] Cardiac and Pulmonary Rehab  Referring Provider  Jeremy [name]      Initial Encounter Date:    Cardiac Rehab from 11/09/2018 in [facility] Cardiac and Pulmonary Rehab  Date  11/09/18      Visit Diagnosis: S/P CABG x 3  Patient's Home Medications on Admission:  Current Outpatient Medications:  .  acetaminophen (TYLENOL) 500 MG tablet, Take 1,000 mg 2 (two) times daily as needed by mouth for moderate pain., Disp: , Rfl:  .  allopurinol (ZYLOPRIM) 300 MG tablet, Take 300 mg by mouth daily., Disp: , Rfl: 5 .  amiodarone (PACERONE) 200 MG tablet, Take 1 tablet (200 mg total) by mouth daily., Disp: 180 tablet, Rfl: 3 .  aspirin EC 81 MG EC tablet, Take 1 tablet (81 mg total) by mouth daily., Disp:  , Rfl:  .  AURYXIA 1 GM 210 MG(Fe) tablet, TAKE 2 TABLETS BY MOUTH THREE TIMES A DAY WITH MEALS, Disp: , Rfl:  .  cinacalcet (SENSIPAR) 30 MG tablet, Take 30 mg by mouth every other day. , Disp: , Rfl:  .  Darbepoetin Alfa (ARANESP) 100 MCG/0.5ML SOSY injection, Inject 0.5 mLs (100 mcg total) into the vein every Monday with hemodialysis., Disp: 4.2 mL, Rfl:  .  Dextrose-Fructose-Sod Citrate (NAUZENE PO), Take 1-2 tablets 2 (two) times daily as needed by mouth (nausea)., Disp: , Rfl:  .  folic acid-vitamin b complex-vitamin c-selenium-zinc (DIALYVITE) 3 MG TABS tablet, Take 1 tablet by mouth daily., Disp: , Rfl:  .  lidocaine-prilocaine (EMLA) cream, Apply 1 application topically as needed (for fistula)., Disp: , Rfl:  .  metoprolol tartrate (LOPRESSOR) 25 MG tablet, TAKE 1/2 TABLETS (12.5 MG TOTAL) BY MOUTH 2 (TWO) TIMES DAILY., Disp: 30 tablet, Rfl: 1 .  midodrine (PROAMATINE) 10 MG tablet, Take 10 mg by mouth 2 (two) times daily as needed (for low BP)., Disp: , Rfl:  .  multivitamin (RENA-VIT) TABS tablet, Take 1 tablet by mouth daily.,  Disp: , Rfl:  .  omeprazole (PRILOSEC) 20 MG capsule, Take 20 mg by mouth daily., Disp: , Rfl:  .  pravastatin (PRAVACHOL) 20 MG tablet, Take 1 tablet (20 mg total) by mouth every evening., Disp: 90 tablet, Rfl: 3 .  predniSONE (DELTASONE) 5 MG tablet, Take 5 mg by mouth daily with breakfast. , Disp: , Rfl:  .  sevelamer carbonate (RENVELA) 800 MG tablet, Take 40,000 mg by mouth 3 (three) times daily with meals. 5 tabs per meal, Disp: , Rfl:  .  warfarin (COUMADIN) 1 MG tablet, Take 1 tablet (1 mg total) by mouth daily at 6 PM. (Patient taking differently: Take 1-2 mg by mouth See admin instructions. 1 mg all days , except 0.5 MG on Fridays..* OR AS DIRECTED), Disp: 90 tablet, Rfl: 1  Past Medical History: Past Medical History:  Diagnosis Date  . 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 Aortic stenosis    a. 2014 s/p mech AVR ([facility])-->chronic coumadin; b. 08/2018 Echo: Mild AS/AI. Nl fxn/gradient.  . Arthritis   . Atrial fibrillation ([place])   . CAD (coronary artery disease)    a. 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 02/2018 MV ([facility]: large inf, infsept, inflat infarct w/ min rev, EF 30%; c. 06/2018 Cath: LM min  irregs, LAD 60p, 95/30/19m, LCX min irregs, RCA 40p, 48m 90d, RPAV 90, RPDA 95ost; c. 09/2018 CABGx3 (LIMA->LAD, VG->D1, VG->PDA).  . Chronic anticoagulation 06/2012   a. Coumadin in the setting of mech AVR and afib.  .Marland KitchenCPAP (continuous positive airway pressure) dependence   . Dialysis patient ([facility] 04/07/2006   Patient started HD around 1996,  had 1st transplant LLQ around 1998 until 2007 when they found renal cell cancer in transplant and both native kidneys, all were removed > went back on HD until 2nd transplant RLQ in 2014 which lasted 3 yrs; it was embolized for suspected acute rejection. [place]  . ESRD (end stage renal disease) ([place]    a. CKD 2/2 to IgA  nephropathy (dx age 63; b. 1999 s/p Kidney transplant; c. 2014 Bilat nephrectomy (transplanted kidney resected) in the setting of renal cell carcinoma; d. PD until 11/2017-->HD.  .Marland KitchenHeart murmur   . HFrEF (heart failure with reduced ejection fraction) ([place]    a. 07/2016 Echo: EF 55-60%; b. 02/2018 MV: EF 30%; c. 04/2018 Echo: EF 35%; d. 08/2018 Echo: EF 30-35%. Glob HK w/o rwma. Mildly reduced RV fxn. Mod to sev dil LA. Nl AoV fxn.  .Marland KitchenHx of colonoscopy    2009 by Dr. RLaural Jeremy [name] Normal except for small submucosal lipoma. No evidence of polyps or colitis.  . Hyperlipidemia   . Hypertension   . Hypogonadism in male 07/25/2015  . Ischemic cardiomyopathy    a. 07/2016 Echo: EF 55-60%; b. 02/2018 MV: EF 30%; c. 04/2018 Echo: EF 35%; d. 08/2018 Echo: EF 30-35%.  . OSA (obstructive sleep apnea)    No CPAP  . Permanent atrial fibrillation ([facility])    a. CHA2DS2VASc = 3-->Coumadin (also mech AVR); b. 06/2018 Amio started 2/2 elevated rates.  . [place]   . [place]    Tobacco Use: Social History   Tobacco Use  Smoking Status Never Smoker  Smokeless Tobacco Never Used    Labs: Recent Review Flowsheet Data    Labs for ITP Cardiac and Pulmonary Rehab Latest Ref Rng & Units 09/16/2018 09/16/2018 09/16/2018 09/16/2018 09/16/2018   Cholestrol 0 - 200 mg/dL - - - - -   LDLCALC 0 - 99 mg/dL - - - - -   LDLDIRECT mg/dL - - - - -   HDL >39.00 mg/dL - - - - -   Trlycerides 0.0 - 149.0 mg/dL - - - - -   Hemoglobin A1c 4.8 - 5.6 % - - - - -   PHART 7.350 - 7.450 7.393 7.390 7.375 7.329(L) -   PCO2ART 32.0 - 48.0 mmHg 45.2 43.1 44.0 48.1(H) -   HCO3 20.0 - 28.0 mmol/L 27.6 26.3 25.7 25.2 -   TCO2 22 - 32 mmol/L _0 ACIDBASEDEF 0.0 - 2.0 mmol/L - - - 1.0 -   O2SAT % 98.0 98.0 99.0 99.0 -       Jeremy [name] Target Goals: Jeremy [name] Program Goal: Individual Jeremy [name] prescription set using results from initial 6 min walk test and THRR while considering  patient's  activity barriers and safety.   Jeremy [name] Prescription Goal: Initial Jeremy [name] prescription builds to 30-45 minutes a day of aerobic activity, 2-3 days per week.  Home Jeremy [name] guidelines will be given to patient during program as part of Jeremy [name] prescription that the participant will acknowledge.  Activity Barriers & Risk Stratification: Activity Barriers & Cardiac  Risk Stratification - 11/04/18 1054      Activity Barriers & Cardiac Risk Stratification   Activity Barriers  Joint Problems;Muscular Weakness    Cardiac Risk Stratification  High       6 Minute Walk: 6 Minute Walk    Row Name 11/09/18 1543         6 Minute Walk   Distance  1288 feet     Walk Time  6 minutes     # of Rest Breaks  0     MPH  2.44     METS  3.6     RPE  11     VO2 Peak  12.6     Symptoms  No     Resting HR  74 bpm     Resting BP  122/60     Max Ex. HR  116 bpm     Max Ex. BP  134/62     2 Minute Post BP  112/62        Oxygen Initial Assessment:   Oxygen Re-Evaluation:   Oxygen Discharge (Final Oxygen Re-Evaluation):   Initial Jeremy [name] Prescription: Initial Jeremy [name] Prescription - 11/09/18 1500      Date of Initial Jeremy [name] RX and Referring Provider   Date  11/09/18    Referring Provider  Jeremy [name]      Treadmill   MPH  2.5    Grade  2    Minutes  15    METs  3.6      NuStep   Level  4    SPM  80    Minutes  15    METs  3.6      REL-XR   Level  3    Speed  50    Minutes  15    METs  3.6      Prescription Details   Frequency (times per week)  2    Duration  Progress to 30 minutes of continuous aerobic without signs/symptoms of physical distress      Intensity   THRR 40-80% of Max Heartrate  115-144    Ratings of Perceived Exertion  11-15    Perceived Dyspnea  0-4      Progression   Progression  Continue to progress workloads to maintain intensity without signs/symptoms of physical distress.      Resistance Training   Training Prescription  Yes    Weight  3 lb     Reps  10-15       Perform Capillary Blood Glucose checks as needed.  Jeremy [name] Prescription Changes: Jeremy [name] Prescription Changes    Row Name 11/10/18 1200 11/18/18 0800 11/26/18 0800 11/30/18 1500 12/15/18 1100     Response to Jeremy [name]   Blood Pressure (Admit)  122/60  -  138/68  132/60  114/58   Blood Pressure (Jeremy [name])  134/62  -  150/80  138/68  118/60   Blood Pressure (Exit)  112/62  -  104/60  114/60  110/56   Heart Rate (Admit)  74 bpm  -  76 bpm  83 bpm  90 bpm   Heart Rate (Jeremy [name])  116 bpm  -  111 bpm  115 bpm  111 bpm   Heart Rate (Exit)  83 bpm  -  89 bpm  89 bpm  89 bpm   Rating of Perceived Exertion (Jeremy [name])  11  -  _0 Symptoms  none  -  none  none  none  Duration  -  -  Progress to 30 minutes of  aerobic without signs/symptoms of physical distress  Continue with 30 min of aerobic Jeremy [name] without signs/symptoms of physical distress.  Continue with 30 min of aerobic Jeremy [name] without signs/symptoms of physical distress.   Intensity  -  -  THRR unchanged  THRR unchanged  THRR unchanged     Progression   Progression  -  -  Continue to progress workloads to maintain intensity without signs/symptoms of physical distress.  Continue to progress workloads to maintain intensity without signs/symptoms of physical distress.  Continue to progress workloads to maintain intensity without signs/symptoms of physical distress.   Average METs  -  -  2.9  2.8  2.8     Resistance Training   Training Prescription  -  -  Yes  Yes  Yes   Weight  -  -  _0 Reps  -  -  10-15  10-15  10-15     Interval Training   Interval Training  -  -  No  -  No     Treadmill   MPH  -  -  2.5  -  -   Grade  -  -  2  -  -   Minutes  -  -  15  -  -   METs  -  -  3.6  -  -     NuStep   Level  -  -  _1 SPM  -  -  50  50  50   Minutes  -  -  _2 METs  -  -  2.8  3.6  3.6     REL-XR   Level  -  -  1._3 Speed  -  -  50  50  50   Minutes  -  -  _4 METs  -  -  1.9  1.9  1.9     Home Jeremy [name] Plan   Plans to continue Jeremy [name] at  -  Home (comment) walking  Home (comment) walking  Home (comment) walking  Home (comment) walking   Frequency  -  Add 2 additional days to program Jeremy [name] sessions.  Add 2 additional days to program Jeremy [name] sessions.  Add 2 additional days to program Jeremy [name] sessions.  Add 2 additional days to program Jeremy [name] sessions.   Initial Home Exercises Provided  -  11/18/18  11/18/18  11/18/18  11/18/18   Row Name 01/19/19 1000 01/26/19 1200 02/09/19 1000 02/24/19 1300       Response to Jeremy [name]   Blood Pressure (Admit)  124/60  110/60  124/60  124/60    Blood Pressure (Jeremy [name])  140/68  124/68  110/70  164/76    Blood Pressure (Exit)  134/58  108/58  118/60  122/60    Heart Rate (Admit)  90 bpm  83 bpm  99 bpm  78 bpm    Heart Rate (Jeremy [name])  112 bpm  113 bpm  121 bpm  104 bpm    Heart Rate (Exit)  96 bpm  93 bpm  94 bpm  92 bpm    Rating of Perceived Exertion (Jeremy [name])  _5 Symptoms  none  noone  none  none  Duration  Continue with 30 min of aerobic Jeremy [name] without signs/symptoms of physical distress.  Continue with 30 min of aerobic Jeremy [name] without signs/symptoms of physical distress.  Continue with 30 min of aerobic Jeremy [name] without signs/symptoms of physical distress.  Continue with 30 min of aerobic Jeremy [name] without signs/symptoms of physical distress.    Intensity  THRR unchanged  THRR unchanged  THRR unchanged  THRR unchanged      Progression   Progression  Continue to progress workloads to maintain intensity without signs/symptoms of physical distress.  Continue to progress workloads to maintain intensity without signs/symptoms of physical distress.  Continue to progress workloads to maintain intensity without signs/symptoms of physical distress.  Continue to progress workloads to maintain intensity without signs/symptoms of physical distress.    Average METs  3  3  3.19  3.6       Resistance Training   Training Prescription  Yes  Yes  Yes  Yes    Weight  6 lbs  6 lb  6 lbs  6 lb    Reps  10-15  10-15  10-15  10-15      Interval Training   Interval Training  No  No  No  No      Treadmill   MPH  2.5  -  2.5  2.5    Grade  2.5  -  2.5  2.5    Minutes  15  -  15  15    METs  3.78  -  3.78  3.78      NuStep   Level  _0 Minutes  _1 METs  3.4  3.5  3.4  3.8      REL-XR   Level  2  2.3  2.3  -    Minutes  _2 -    METs  1.8  2.4  2.4  -      Home Jeremy [name] Plan   Plans to continue Jeremy [name] at  Home (comment) walking  Home (comment) walking  Home (comment) walking  Home (comment) walking    Frequency  Add 2 additional days to program Jeremy [name] sessions.  Add 2 additional days to program Jeremy [name] sessions.  Add 2 additional days to program Jeremy [name] sessions.  Add 2 additional days to program Jeremy [name] sessions.    Initial Home Exercises Provided  11/18/18  11/18/18  11/18/18  11/18/18       Jeremy [name] Comments: Jeremy [name] Comments    Row Name 11/11/18 0840 11/16/18 0828 12/09/18 0912       Jeremy [name] Comments  First full day of Jeremy [name]!  Patient was oriented to gym and equipment including functions, settings, policies, and procedures.  Patient's individual Jeremy [name] prescription and treatment plan were reviewed.  All starting workloads were established based on the results of the 6 minute walk test done at initial orientation visit.  The plan for Jeremy [name] progression was also introduced and progression will be customized based on patient's performance and goals.  Jeremy Jeremy [name] stated that after he went home last week his chest was very sore.  Remains sore today. He was advised to decrease or not use weights today.  Also to call his doctor if pain continues  Jeremy Jeremy [name] had lower BP today after dialysis yesterday        Jeremy [name] Goals and Review: Jeremy [name] Goals    Row Name 11/09/18 [phone]  Jeremy [name] Goals   Increase Physical Activity  Yes        Expected Outcomes  Short Term: Attend rehab on a regular basis to increase amount of physical activity.;Long Term: Add in home Jeremy [name] to make Jeremy [name] part of routine and to increase amount of physical activity.;Long Term: Exercising regularly at least 3-5 days a week.       Increase Strength and Stamina  Yes       Intervention  Provide advice, education, support and counseling about physical activity/Jeremy [name] needs.;Develop an individualized Jeremy [name] prescription for aerobic and resistive training based on initial evaluation findings, risk stratification, comorbidities and participant's personal goals.       Expected Outcomes  Short Term: Increase workloads from initial Jeremy [name] prescription for resistance, speed, and METs.;Short Term: Perform resistance training exercises routinely during rehab and add in resistance training at home;Long Term: Improve cardiorespiratory fitness, muscular endurance and strength as measured by increased METs and functional capacity (6MWT)       Able to understand and use rate of perceived exertion (RPE) scale  Yes       Intervention  Provide education and explanation on how to use RPE scale       Expected Outcomes  Short Term: Able to use RPE daily in rehab to express subjective intensity level;Long Term:  Able to use RPE to guide intensity level when exercising independently       Able to understand and use Dyspnea scale  Yes       Intervention  Provide education and explanation on how to use Dyspnea scale       Expected Outcomes  Short Term: Able to use Dyspnea scale daily in rehab to express subjective sense of shortness of breath during exertion;Long Term: Able to use Dyspnea scale to guide intensity level when exercising independently       Knowledge and understanding of Target Heart Rate Range (THRR)  Yes       Intervention  Provide education and explanation of THRR including how the numbers were predicted and where they are located for reference        Expected Outcomes  Short Term: Able to state/look up THRR;Short Term: Able to use daily as guideline for intensity in rehab;Long Term: Able to use THRR to govern intensity when exercising independently       Able to check pulse independently  Yes       Intervention  Provide education and demonstration on how to check pulse in carotid and radial arteries.;Review the importance of being able to check your own pulse for safety during independent Jeremy [name]       Expected Outcomes  Short Term: Able to explain why pulse checking is important during independent Jeremy [name];Long Term: Able to check pulse independently and accurately       Understanding of Jeremy [name] Prescription  Yes       Intervention  Provide education, explanation, and written materials on patient's individual Jeremy [name] prescription       Expected Outcomes  Short Term: Able to explain program Jeremy [name] prescription;Long Term: Able to explain home Jeremy [name] prescription to Jeremy [name] independently          Jeremy [name] Goals Re-Evaluation : Jeremy [name] Goals Re-Evaluation    Row Name 11/11/18 0840 11/18/18 0819 11/26/18 0816 11/30/18 1544 12/15/18 1120     Jeremy [name] Goal Re-Evaluation   Jeremy [name] Goals Review  Increase Physical Activity;Increase Strength and Stamina;Able to understand and use rate of perceived exertion (RPE) scale;Knowledge and understanding of Target Heart Rate  Range (THRR);Understanding of Jeremy [name] Prescription  Increase Physical Activity;Increase Strength and Stamina;Able to understand and use rate of perceived exertion (RPE) scale;Knowledge and understanding of Target Heart Rate Range (THRR);Understanding of Jeremy [name] Prescription;Able to understand and use Dyspnea scale;Able to check pulse independently  Increase Physical Activity;Increase Strength and Stamina;Able to understand and use rate of perceived exertion (RPE) scale;Knowledge and understanding of Target Heart Rate Range (THRR);Understanding of Jeremy [name] Prescription  Increase  Physical Activity;Increase Strength and Stamina;Able to understand and use rate of perceived exertion (RPE) scale;Able to understand and use Dyspnea scale;Knowledge and understanding of Target Heart Rate Range (THRR);Understanding of Jeremy [name] Prescription  Increase Physical Activity;Able to understand and use rate of perceived exertion (RPE) scale;Increase Strength and Stamina;Knowledge and understanding of Target Heart Rate Range (THRR);Able to check pulse independently;Understanding of Jeremy [name] Prescription   Comments  Reviewed RPE scale, THR and program prescription with pt today.  Pt voiced understanding and was given a copy of goals to take home.  Reviewed home Jeremy [name] with pt today.  Pt plans to walk at home for Jeremy [name].  Reviewed THR, pulse, RPE, sign and symptoms, NTG use, and when to call 911 or MD.  Also discussed weather considerations and indoor options.  Pt voiced understanding.  Jeremy Jeremy [name] is doing well in rehab so far.  He plans to increase levels at next session.  he attends 2 days per week due to dialysis on other days.  Schylar increased levels on both machines today.  he is walking and doing strength work 2 days per week outside class.  Tavone attends consistently and works at appropriate RPE.  Staff will monitor progress.   Expected Outcomes  Short: Use RPE daily to regulate intensity. Long: Follow program prescription in THR.  Short: Start to add in walking on off days.  Long: Continue to follow program prescription  Short - continue to attend consistently Long :  increase overall MET level  Short - continue home Jeremy [name] Long - maintain fitness independently  Short - continue to gradually build intensity Long - improve overall MET level   Row Name 01/18/19 1550 01/26/19 1242 02/09/19 1043 02/15/19 0826 02/24/19 1351     Jeremy [name] Goal Re-Evaluation   Jeremy [name] Goals Review  Increase Physical Activity;Increase Strength and Stamina;Understanding of Jeremy [name] Prescription  Increase Physical  Activity;Increase Strength and Stamina;Able to understand and use rate of perceived exertion (RPE) scale;Knowledge and understanding of Target Heart Rate Range (THRR);Able to check pulse independently;Understanding of Jeremy [name] Prescription  Increase Physical Activity;Increase Strength and Stamina;Understanding of Jeremy [name] Prescription  Increase Physical Activity;Increase Strength and Stamina  Increase Physical Activity;Increase Strength and Stamina;Able to understand and use rate of perceived exertion (RPE) scale;Knowledge and understanding of Target Heart Rate Range (THRR);Understanding of Jeremy [name] Prescription   Comments  Jeremy Jeremy [name] continues to do well in rehab!  He is still making improvements!  We will continue to monitor his progress.  Jeremy Jeremy [name] has increased to 6 lb for strength and works at [employer].  He does Jeremy [name] on days he doesnt go to dialysis.  Jeremy Jeremy [name] continues to do well in rehab.  He is up to level 2.3 on the recumbenet elliptical and 2.5 mph on the treadmill.  We will continue to monitor his progress.  Jeremy Jeremy [name] has been doing some dumbell work and squats at home. He has been walking on the weekends about 1 mile a day. His hip has been hurting and has since had it checked out but none of his doctors have come up with any diagnosis. Jeremy Jeremy [name] wants to feel  better but thinks dialysis is taken a toll on him. He says that when he gets home from dialysis he is tired and does not do too much.  Jeremy Jeremy [name] coontinues to do well with Jeremy [name].  Staff reviewed the figure 4 stretch for his hip since there is no diganosis of bone issues.  He will try the stretch at home.   Expected Outcomes  Short: Continue to increase workloads.  Long: Continue to improve stamina.  Short - continue to Jeremy [name] at home Long - increase overall MET level  Short: Continue to boost workloads and talk about intervals.  Long: Continue improve stamina.  Short: continue Jeremy [name] at home twice a week. Long: maintain a workout routine independently post rehab.   Short: add figure 4 stretch to home Jeremy [name] Long :  maintain fitness on his own      Discharge Jeremy [name] Prescription (Final Jeremy [name] Prescription Changes): Jeremy [name] Prescription Changes - 02/24/19 1300      Response to Jeremy [name]   Blood Pressure (Admit)  124/60    Blood Pressure (Jeremy [name])  164/76    Blood Pressure (Exit)  122/60    Heart Rate (Admit)  78 bpm    Heart Rate (Jeremy [name])  104 bpm    Heart Rate (Exit)  92 bpm    Rating of Perceived Exertion (Jeremy [name])  14    Symptoms  none    Duration  Continue with 30 min of aerobic Jeremy [name] without signs/symptoms of physical distress.    Intensity  THRR unchanged      Progression   Progression  Continue to progress workloads to maintain intensity without signs/symptoms of physical distress.    Average METs  3.6      Resistance Training   Training Prescription  Yes    Weight  6 lb    Reps  10-15      Interval Training   Interval Training  No      Treadmill   MPH  2.5    Grade  2.5    Minutes  15    METs  3.78      NuStep   Level  6    Minutes  15    METs  3.8      Home Jeremy [name] Plan   Plans to continue Jeremy [name] at  Home (comment)   walking   Frequency  Add 2 additional days to program Jeremy [name] sessions.    Initial Home Exercises Provided  11/18/18       Nutrition:  Target Goals: Understanding of nutrition guidelines, daily intake of sodium <1555m, cholesterol <2061m calories 30% from fat and 7% or less from saturated fats, daily to have 5 or more servings of fruits and vegetables.  Biometrics: Pre Biometrics - 11/09/18 1549      Pre Biometrics   Height  5' 9.75" (1.772 m)    Weight  188 lb 8 oz (85.5 kg)    BMI (Calculated)  27.23    Single Leg Stand  7.06 seconds        Nutrition Therapy Plan and Nutrition Goals: Nutrition Therapy & Goals - 12/07/18 0812      Personal Nutrition Goals   Comments  Pt reported in the hospital (renal and HH diet) his fluid retention dropped and his stomach was not as  "bloated". Pt has renal diettian follwoing him; went over CKD on dialysis basics and provided handouts. Went over gout basics and provided handout (pt reports organ meats, steak and pork chops are his most noticible triggers  and is on medication). Pt is on coumadin and continues to eat a consistent amount of leafy greens (working with doctor). Discussed Hh eating in the context of his other issues, discussed how it will be a balancing act and we may have to fine tune things. Pt reports protein is WNL since including eggs, he takes his renal vitamin after dialysis, and he is on a phosphate binder. Pt reports eating cheerios or oatmeal on non-dialysis days and nothing on dialysis days for B. L both days is eggs with [place] bacon. S grits and peanut butter. D chicken or fish and vegtables. Pt reports sometimes eating fried food but cut out fried foods and red meat for the most part. Discussed low Na eating. Pt is also mindful of the amount of liquids he consumes (usually water, tea, or juice).       Nutrition Assessments: Nutrition Assessments - 11/11/18 1138      MEDFICTS Scores   Pre Score  43       Nutrition Goals Re-Evaluation: Nutrition Goals Re-Evaluation    Row Name 12/07/18 0809 12/21/18 0856 02/08/19 1133 03/01/19 0838       Goals   Current Weight  188 lb (85.3 kg)  190 lb (86.2 kg)  -  191 lb 11.2 oz (87 kg)    Nutrition Goal  ST: +1 serving of F/V/day (now having 2/day) LT: gain strength and get back to doing what he was doing before, reducing fluid retention.  ST: continue renal diet LT: gain strength and get back to doing what he was doing before, reducing fluid retention.  ST: continue renal diet LT: gain strength and get back to doing what he was doing before, reducing fluid retention.  ST: continue renal diet LT: gain strength and get back to doing what he was doing before, reducing fluid retention.    Comment  starting him on vitamin D. Energy has been good, but having issues with  joints (knees and hips) - not sure if medications - sent message to cardiologist. Told him to refer to the renal handout I gave him to see fruits and vegetables that work with his renal diet. Pt to have report card from dialysis this week. Pt reports energy is good. Pt now limits fried foods and instead uses shake n' bake for chicken and BBQ on pork chops baked - advised to just be mindful of the salt in BBQ sauce and shake n' Bake. Pt reports dry weight is 85 kg.  Pt reports per dialysis report card his K+ was up, but is now coming back down. He believes it was the nacho basket he got from [employer] with tomatoes and beans. Pt reports being tired of the renal diet after being on dialysis so long and expressed frustration, but acceptance. Encouraged pt that he was doing a good job. Pt reports eating the foods less desired on the renal and HH diet in moderation (ex. 1 piece of bacon instead of all they give yoyu when eating out). Pt reports not needing anything from RD at this time.  Continue with current changes  Answered questions about zinc. Pt at dry weight today. trying to watch what he is eating, not going out as much. Reports "trying to get in the groove", focusing on small steps.    Expected Outcome  Manage edema, dialysis, and gout.  Manage edema, dialysis, and gout.  Manage edema, dialysis, and gout.  Manage edema, dialysis, and gout.  Nutrition Goals Discharge (Final Nutrition Goals Re-Evaluation): Nutrition Goals Re-Evaluation - 03/01/19 3419      Goals   Current Weight  191 lb 11.2 oz (87 kg)    Nutrition Goal  ST: continue renal diet LT: gain strength and get back to doing what he was doing before, reducing fluid retention.    Comment  Answered questions about zinc. Pt at dry weight today. trying to watch what he is eating, not going out as much. Reports "trying to get in the groove", focusing on small steps.    Expected Outcome  Manage edema, dialysis, and gout.        Psychosocial: Target Goals: Acknowledge presence or absence of significant depression and/or stress, maximize coping skills, provide positive support system. Participant is able to verbalize types and ability to use techniques and skills needed for reducing stress and depression.   Initial Review & Psychosocial Screening: Initial Psych Review & Screening - 11/04/18 1049      Initial Review   Current issues with  Current Stress Concerns    Source of Stress Concerns  Chronic Illness    Comments  Jeremy Jeremy [name] is on HD M,W,F. He has a good support system and is handling it well. He stays on top of managing his health, but it gets exhausting at times.      Family Dynamics   Good Support System?  Yes      Barriers   Psychosocial barriers to participate in program  There are no identifiable barriers or psychosocial needs.      Screening Interventions   Interventions  Encouraged to Jeremy [name];Provide feedback about the scores to participant    Expected Outcomes  Short Term goal: Utilizing psychosocial counselor, staff and physician to assist with identification of specific Stressors or current issues interfering with healing process. Setting desired goal for each stressor or current issue identified.;Long Term Goal: Stressors or current issues are controlled or eliminated.;Short Term goal: Identification and review with participant of any Quality of Life or Depression concerns found by scoring the questionnaire.;Long Term goal: The participant improves quality of Life and PHQ9 Scores as seen by post scores and/or verbalization of changes       Quality of Life Scores:  Quality of Life - 11/11/18 1138      Quality of Life   Select  Quality of Life      Quality of Life Scores   Health/Function Pre  20.73 %    Socioeconomic Pre  19 %    Psych/Spiritual Pre  20.86 %    Family Pre  20.6 %    GLOBAL Pre  20.38 %      Scores of 19 and below usually indicate a poorer quality of life in these areas.  A  difference of  2-3 points is a clinically meaningful difference.  A difference of 2-3 points in the total score of the Quality of Life Index has been associated with significant improvement in overall quality of life, self-image, physical symptoms, and general health in studies assessing change in quality of life.  PHQ-9: Recent Review Flowsheet Data    Depression screen [facility] 2/9 11/09/2018 03/19/2018   Decreased Interest 0 0   Down, Depressed, Hopeless 0 0   PHQ - 2 Score 0 0   Altered sleeping 0 -   Tired, decreased energy 0 -   Change in appetite 0 -   Feeling bad or failure about yourself  0 -   Trouble concentrating 0 -  Moving slowly or fidgety/restless 0 -   Suicidal thoughts 0 -   PHQ-9 Score 0 -   Difficult doing work/chores Not difficult at all -     Interpretation of Total Score  Total Score Depression Severity:  1-4 = Minimal depression, 5-9 = Mild depression, 10-14 = Moderate depression, 15-19 = Moderately severe depression, 20-27 = Severe depression   Psychosocial Evaluation and Intervention:   Psychosocial Re-Evaluation: Psychosocial Re-Evaluation    Row Name 12/23/18 0810 01/25/19 0814 02/15/19 0831         Psychosocial Re-Evaluation   Current issues with  Current Stress Concerns;Current Sleep Concerns  Current Stress Concerns  None Identified;Current Stress Concerns     Comments  Jeremy Jeremy [name] says he has no current stress or sleep concerns  Jeremy Jeremy [name] still has no stress concerns  He keeps his mind off stress by doing other activities like working on cars.  Jeremy Jeremy [name] is trying to stay positive with all of his health issues. He has made his peace with his health issues but is willing to stay as healthy as he can.     Expected Outcomes  Short - continue Jeremy [name] Long - manage stress long term  Short - continue hobbies to manage stress Long - keep stress levels low  Short: exrecise and stay positive. Long: maintain a positive outlook on his health.     Interventions  Encouraged to attend  Cardiac Rehabilitation for the Jeremy [name]  -  Encouraged to attend Cardiac Rehabilitation for the Jeremy [name]     Continue Psychosocial Services   -  -  Follow up required by staff        Psychosocial Discharge (Final Psychosocial Re-Evaluation): Psychosocial Re-Evaluation - 02/15/19 0831      Psychosocial Re-Evaluation   Current issues with  None Identified;Current Stress Concerns    Comments  Jeremy Jeremy [name] is trying to stay positive with all of his health issues. He has made his peace with his health issues but is willing to stay as healthy as he can.    Expected Outcomes  Short: exrecise and stay positive. Long: maintain a positive outlook on his health.    Interventions  Encouraged to attend Cardiac Rehabilitation for the Jeremy [name]    Continue Psychosocial Services   Follow up required by staff       Vocational Rehabilitation: Provide vocational rehab assistance to qualifying candidates.   Vocational Rehab Evaluation & Intervention: Vocational Rehab - 11/04/18 1048      Initial Vocational Rehab Evaluation & Intervention   Assessment shows need for Vocational Rehabilitation  No       Education: Education Goals: Education classes will be provided on a variety of topics geared toward better understanding of heart health and risk factor modification. Participant will state understanding/return demonstration of topics presented as noted by education test scores.  Learning Barriers/Preferences: Learning Barriers/Preferences - 11/04/18 1048      Learning Barriers/Preferences   Learning Barriers  None    Learning Preferences  None       Education Topics:  AED/CPR: - Group verbal and written instruction with the use of models to demonstrate the basic use of the AED with the basic ABC's of resuscitation.   General Nutrition Guidelines/Fats and Fiber: -Group instruction provided by verbal, written material, models and posters to present the general guidelines for heart healthy nutrition.  Gives an explanation and review of dietary fats and fiber.   Cardiac Rehab from 02/24/2019 in [facility] Cardiac and Pulmonary Rehab  Date  02/24/19  Educator  Jeremy Jeremy [name]  Instruction Review Code  1- Verbalizes Understanding      Controlling Sodium/Reading Food Labels: -Group verbal and written material supporting the discussion of sodium use in heart healthy nutrition. Review and explanation with models, verbal and written materials for utilization of the food label.   Jeremy [name] Physiology & General Jeremy [name] Guidelines: - Group verbal and written instruction with models to review the Jeremy [name] physiology of the cardiovascular system and associated critical values. Provides general Jeremy [name] guidelines with specific guidelines to those with heart or lung disease.    Aerobic Jeremy [name] & Resistance Training: - Gives group verbal and written instruction on the various components of Jeremy [name]. Focuses on aerobic and resistive training programs and the benefits of this training and how to safely progress through these programs..   Flexibility, Balance, Mind/Body Relaxation: Provides group verbal/written instruction on the benefits of flexibility and balance training, including mind/body Jeremy [name] modes such as yoga, pilates and tai chi.  Demonstration and skill practice provided.   Cardiac Rehab from 02/24/2019 in [facility] Cardiac and Pulmonary Rehab  Date  02/24/19  Educator  as  Instruction Review Code  1- Verbalizes Understanding      Stress and Anxiety: - Provides group verbal and written instruction about the health risks of elevated stress and causes of high stress.  Discuss the correlation between heart/lung disease and anxiety and treatment options. Review healthy ways to manage with stress and anxiety.   Depression: - Provides group verbal and written instruction on the correlation between heart/lung disease and depressed mood, treatment options, and the stigmas associated with seeking  treatment.   Anatomy & Physiology of the Heart: - Group verbal and written instruction and models provide basic cardiac anatomy and physiology, with the coronary electrical and arterial systems. Review of Valvular disease and Heart Failure   Cardiac Procedures: - Group verbal and written instruction to review commonly prescribed medications for heart disease. Reviews the medication, class of the drug, and side effects. Includes the steps to properly store meds and maintain the prescription regimen. (beta blockers and nitrates)   Cardiac Medications I: - Group verbal and written instruction to review commonly prescribed medications for heart disease. Reviews the medication, class of the drug, and side effects. Includes the steps to properly store meds and maintain the prescription regimen.   Cardiac Medications II: -Group verbal and written instruction to review commonly prescribed medications for heart disease. Reviews the medication, class of the drug, and side effects. (all other drug classes)    Go Sex-Intimacy & Heart Disease, Get SMART - Goal Setting: - Group verbal and written instruction through game format to discuss heart disease and the return to sexual intimacy. Provides group verbal and written material to discuss and apply goal setting through the application of the S.M.A.R.T. Method.   Other Matters of the Heart: - Provides group verbal, written materials and models to describe Stable Angina and Peripheral Artery. Includes description of the disease process and treatment options available to the cardiac patient.   Jeremy [name] & Equipment Safety: - Individual verbal instruction and demonstration of equipment use and safety with use of the equipment.   Cardiac Rehab from 02/24/2019 in [facility] Cardiac and Pulmonary Rehab  Date  11/09/18  Educator  AS  Instruction Review Code  1- Verbalizes Understanding      Infection Prevention: - Provides verbal and written material to  individual with discussion of infection control including proper hand washing and proper equipment cleaning during Jeremy [name] session.   Cardiac Rehab from  02/24/2019 in [facility] Cardiac and Pulmonary Rehab  Date  11/09/18  Educator  AS  Instruction Review Code  1- Verbalizes Understanding      Falls Prevention: - Provides verbal and written material to individual with discussion of falls prevention and safety.   Cardiac Rehab from 02/24/2019 in [facility] Cardiac and Pulmonary Rehab  Date  11/09/18  Educator  AS  Instruction Review Code  1- Verbalizes Understanding      Diabetes: - Individual verbal and written instruction to review signs/symptoms of diabetes, desired ranges of glucose level fasting, after meals and with Jeremy [name]. Acknowledge that pre and post Jeremy [name] glucose checks will be done for 3 sessions at entry of program.   Know Your Numbers and Risk Factors: -Group verbal and written instruction about important numbers in your health.  Discussion of what are risk factors and how they play a role in the disease process.  Review of Cholesterol, Blood Pressure, Diabetes, and BMI and the role they play in your overall health.   Sleep Hygiene: -Provides group verbal and written instruction about how sleep can affect your health.  Define sleep hygiene, discuss sleep cycles and impact of sleep habits. Review good sleep hygiene tips.    Other: -Provides group and verbal instruction on various topics (see comments)   Knowledge Questionnaire Score: Knowledge Questionnaire Score - 11/11/18 1138      Knowledge Questionnaire Score   Pre Score  21/26  Angina, Jeremy [name].Depression missed       Core Components/Risk Factors/Patient Goals at Admission: Personal Goals and Risk Factors at Admission - 11/09/18 1552      Core Components/Risk Factors/Patient Goals on Admission    Weight Management  Weight Maintenance    Heart Failure  Yes    Hypertension  Yes    Intervention  Provide  education on lifestyle modifcations including regular physical activity/Jeremy [name], weight management, moderate sodium restriction and increased consumption of fresh fruit, vegetables, and low fat dairy, alcohol moderation, and smoking cessation.;Monitor prescription use compliance.    Expected Outcomes  Short Term: Continued assessment and intervention until BP is < 140/27m HG in hypertensive participants. < 130/863mHG in hypertensive participants with diabetes, heart failure or chronic kidney disease.;Long Term: Maintenance of blood pressure at goal levels.    Intervention  Provide education and support for participant on nutrition & aerobic/resistive Jeremy [name] along with prescribed medications to achieve LDL <7034mHDL >62m57m  Expected Outcomes  Long Term: Cholesterol controlled with medications as prescribed, with individualized Jeremy [name] RX and with personalized nutrition plan. Value goals: LDL < 70mg4mL > 40 mg.    Stress  Yes       Core Components/Risk Factors/Patient Goals Review:  Goals and Risk Factor Review    Row Name 11/30/18 0811 16107/20 0804 01/25/19 0809 02/15/19 0834       Core Components/Risk Factors/Patient Goals Review   Personal Goals Review  Weight Management/Obesity;Develop more efficient breathing techniques such as purse lipped breathing and diaphragmatic breathing and practicing self-pacing with activity.;Hypertension;Lipids  Weight Management/Obesity;Lipids;Hypertension  Weight Management/Obesity;Lipids;Hypertension  Weight Management/Obesity    Review  Jeremy iRydanaking meds as directed and measuring BP at home.  BP has been running normal at home and at HT.  [facility] has some knee and hip pain - was taking hydrochloriquine which helped but he cant take it at this time.  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  We discussed doing some ROM exercises to help with this.  He also takes Tylenol.  Argelio cJamareeinues to take  meds as directed.He had a cortisone shot for his knee and it helped  briefly.  He spoke with his PA about taking glucosamine and is going to try that.  tims knee is doing better - he feels like it is stronger and he can walk up stairs easier.  His hip hurts some - had an xray and didnt hear anything back.  He is taking glucosamine and fish oil.  We reviewed ROM for hip.  He is setting up his total gym.  Rondell wants to maintain his weight. Sometimes he is up and down with his dialysis. Sometimesa his blood pressure drop a little lower after dialysis. His doctors are aware of his dizzyness when he stands up. He is staying on track in rehab and has been attending regularly.    Expected Outcomes  Short - continue Jeremy [name] two days not at HT - add ROM exercises Long - maintain overall Jeremy [name] and health habits  Short - try glucosamine and stretches Long - maintain heart healthy habits  Short - add hip ROM exercises Long - continue to improve stamina and reduce knee and hip pain  Short: continue to Jeremy [name] to maintain weight. Long: maintain weight independently.       Core Components/Risk Factors/Patient Goals at Discharge (Final Review):  Goals and Risk Factor Review - 02/15/19 0834      Core Components/Risk Factors/Patient Goals Review   Personal Goals Review  Weight Management/Obesity    Review  Jakavion wants to maintain his weight. Sometimes he is up and down with his dialysis. Sometimesa his blood pressure drop a little lower after dialysis. His doctors are aware of his dizzyness when he stands up. He is staying on track in rehab and has been attending regularly.    Expected Outcomes  Short: continue to Jeremy [name] to maintain weight. Long: maintain weight independently.       ITP Comments: ITP Comments    Row Name 11/04/18 1055 11/09/18 1542 11/11/18 0840 11/16/18 0825 11/17/18 0548   ITP Comments  Virtual RN orientation completed. Diagnosis can be found in [facility] 6/8. EP/RD orientation scheduled for 8/4 at 1:30  Completed initial 6 MWT and nutrition consult.  Initial ITP  created and sent to Dr Emily Filbert, Medical Director  First full day of Jeremy [name]!  Patient was oriented to gym and equipment including functions, settings, policies, and procedures.  Patient's individual Jeremy [name] prescription and treatment plan were reviewed.  All starting workloads were established based on the results of the 6 minute walk test done at initial orientation visit.  The plan for Jeremy [name] progression was also introduced and progression will be customized based on patient's performance and goals.  Olon stated that after he went home last week his chest was very sore.  Remains sore today. He was advised to decrease or not use weights today.  Also to call his doctor if pain continues.  30 Day Review Completed today. Continue with ITP unless changed by Medical Director review.   [place] Name 12/09/18 0911 12/15/18 0544 12/23/18 0805 02/09/19 0610 03/09/19 0922   ITP Comments  Alice had lower BP today after dialysis yesterday  30 Day review. Continue with ITP unless directed changes per Medical Director review.  Pt states Dialysis has increased his dry weight by half a kilogram  30 day review completed. Continue with ITP sent to Dr. Emily Filbert, Medical Director of Cardiac and Pulmonary Rehab for review , changes as needed and signature.  30 day review competed . ITP sent  to Dr Emily Filbert for review, changes as needed and ITP approval signature.      Comments:

## 2019-03-09 NOTE — Telephone Encounter (Signed)
This encounter was created in error - please disregard.

## 2019-03-09 NOTE — Patient Instructions (Signed)
Spoke w/ pt and advised him to continue dosage of 1 TABLET EVERY DAY EXCEPT 1/2 TABLET ON FRIDAYS.  Recheck INR in 1 week.

## 2019-03-10 NOTE — Progress Notes (Signed)
I connected with Jeremy [name] on 03/10/19 at 11:30 AM EST by video enabled telemedicine visit and verified that I am speaking with the correct person using two identifiers.   I discussed the limitations, risks, security and privacy concerns of performing an evaluation and management service by telemedicine and the availability of in-person appointments. I also discussed with the patient that there may be a patient responsible charge related to this service. The patient expressed understanding and agreed to proceed.  Other persons participating in the visit and their role in the encounter:  none  Patient's location:  home Provider's location:  work  Risk analyst Complaint: Routine follow-up of renal cell carcinoma  History of present illness: patient is a 63 year old male with a complex past medical history.  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  He was then on dialysis for a few years and went on to receive a second renal transplant.  His second kidney transplant got rejected as well and he went back on hemodialysis which he is on presently.  He has been following up with Dr. Turner Daniels from medical oncology at [facility] on the last year for his renal cell carcinoma and underwent CT chest abdomen and pelvis without contrast in November 2019 which did not show any evidence of metastatic disease and he remains on clinical surveillance.    He also has other comorbidities including aortic valve replacement for which he is on Coumadin, atrial fibrillation and also underwent three-vessel CABG recently.  Patient receives EPO and IV iron through nephrology during dialysis   Interval history: Overall patient is doing  well since his last visit.  He was recently exposed to someone with Covid and has gone through Covid testing and waiting for results of his test to come back.  Reports no fever cough or shortness of breath.   Review of Systems  Constitutional: Positive for malaise/fatigue. Negative for chills, fever and weight loss.  HENT: Negative for congestion, ear discharge and nosebleeds.   Eyes: Negative for blurred vision.  Respiratory: Negative for cough, hemoptysis, sputum production, shortness of breath and wheezing.   Cardiovascular: Negative for chest pain, palpitations, orthopnea and claudication.  Gastrointestinal: Negative for abdominal pain, blood in stool, constipation, diarrhea, heartburn, melena, nausea and vomiting.  Genitourinary: Negative for dysuria, flank pain, frequency, hematuria and urgency.  Musculoskeletal: Negative for back pain, joint pain and myalgias.  Skin: Negative for rash.  Neurological: Negative for dizziness, tingling, focal weakness, seizures, weakness and headaches.  Endo/Heme/Allergies: Does not bruise/bleed easily.  Psychiatric/Behavioral: Negative for depression and suicidal ideas. The patient does not have insomnia.     Allergies  Allergen Reactions  . Statins     Other reaction(s): Arthralgias (intolerance), Myalgias (intolerance)  Pt taking pravastatin   . Sertraline Rash    Past Medical History:  Diagnosis Date  . 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 Aortic stenosis    a. 2014 s/p mech AVR ([facility])-->chronic coumadin; b. 08/2018 Echo: Mild AS/AI. Nl fxn/gradient.  . Arthritis   . Atrial fibrillation ([place])   . CAD (coronary artery disease)    a. 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 02/2018 MV (  [facility]): large inf, infsept, inflat infarct w/ min rev, EF 30%; c. 06/2018 Cath: LM min irregs, LAD 60p, 95/30/104m, LCX min irregs, RCA 40p, 78m, 90d, RPAV 90, RPDA 95ost; c. 09/2018 CABGx3  (LIMA->LAD, VG->D1, VG->PDA).  . Chronic anticoagulation 06/2012   a. Coumadin in the setting of mech AVR and afib.  Marland Kitchen CPAP (continuous positive airway pressure) dependence   . Dialysis patient [facility]) 04/07/2006   Patient started HD around 1996,  had 1st transplant LLQ around 1998 until 2007 when they found renal cell cancer in transplant and both native kidneys, all were removed > went back on HD until 2nd transplant RLQ in 2014 which lasted 3 yrs; it was embolized for suspected acute rejection. Is currently on HD MWF in [place] w/ [facility] nephrology.  . ESRD (end stage renal disease) ([place])    a. CKD 2/2 to IgA nephropathy (dx age 32); b. 1999 s/p Kidney transplant; c. 2014 Bilat nephrectomy (transplanted kidney resected) in the setting of renal cell carcinoma; d. PD until 11/2017-->HD.  Marland Kitchen Heart murmur   . HFrEF (heart failure with reduced ejection fraction) ([place])    a. 07/2016 Echo: EF 55-60%; b. 02/2018 MV: EF 30%; c. 04/2018 Echo: EF 35%; d. 08/2018 Echo: EF 30-35%. Glob HK w/o rwma. Mildly reduced RV fxn. Mod to sev dil LA. Nl AoV fxn.  Marland Kitchen Hx of colonoscopy    2009 by Dr. Laural Golden. Normal except for small submucosal lipoma. No evidence of polyps or colitis.  . Hyperlipidemia   . Hypertension   . Hypogonadism in male 07/25/2015  . Ischemic cardiomyopathy    a. 07/2016 Echo: EF 55-60%; b. 02/2018 MV: EF 30%; c. 04/2018 Echo: EF 35%; d. 08/2018 Echo: EF 30-35%.  . OSA (obstructive sleep apnea)    No CPAP  . Permanent atrial fibrillation ([facility])    a. CHA2DS2VASc = 3-->Coumadin (also mech AVR); b. 06/2018 Amio started 2/2 elevated rates.  . Renal cell cancer ([place])   . Renal cell carcinoma ([place]) 08/04/2016    Past Surgical History:  Procedure Laterality Date  . AORTIC VALVE REPLACEMENT  3 /11 /2014   At [place]    . CAPD INSERTION N/A 02/19/2017   Procedure: LAPAROSCOPIC INSERTION CONTINUOUS AMBULATORY PERITONEAL DIALYSIS  (CAPD) CATHETER;  Surgeon:  Algernon Huxley, MD;  Location: [facility];  Service: General;  Laterality: N/A;  . CORONARY ANGIOPLASTY WITH STENT PLACEMENT    . CORONARY ARTERY BYPASS GRAFT N/A 09/16/2018   Procedure: CORONARY ARTERY BYPASS GRAFT times three      with left internal mammary artery and endoharvest of right leg greater saphenous vein;  Surgeon: Melrose Nakayama, MD;  Location: [place];  Service: Open Heart Surgery;  Laterality: N/A;  . DG AV DIALYSIS  SHUNT ACCESS EXIST*L* OR     left arm  . EPIGASTRIC HERNIA REPAIR N/A 02/19/2017   Procedure: HERNIA REPAIR EPIGASTRIC ADULT;  Surgeon: Robert Bellow, MD;  Location: [facility];  Service: General;  Laterality: N/A;  . EYE SURGERY     bilateral cataract  . HERNIA REPAIR     UMBILICAL HERNIA  . HERNIA REPAIR  02/19/2017  . INNER EAR SURGERY     20 years ago  . KIDNEY TRANSPLANT    . Peritoneal Dialysis access placement  02/19/2017  . REMOVAL OF A DIALYSIS CATHETER N/A 11/23/2017   Procedure: REMOVAL OF A PERITONEAL DIALYSIS CATHETER;  Surgeon: Algernon Huxley, MD;  Location: [facility];  Service: Vascular;  Laterality: N/A;  . RIGHT/LEFT HEART CATH AND CORONARY ANGIOGRAPHY Bilateral 06/07/2018   Procedure: RIGHT/LEFT HEART CATH AND CORONARY ANGIOGRAPHY;  Surgeon: Wellington Hampshire, MD;  Location: [facility];  Service: Cardiovascular;  Laterality: Bilateral;  . TEE WITHOUT CARDIOVERSION N/A 07/21/2016   Procedure: TRANSESOPHAGEAL ECHOCARDIOGRAM (TEE);  Surgeon: Wellington Hampshire, MD;  Location: [facility];  Service: Cardiovascular;  Laterality: N/A;  . TEE WITHOUT CARDIOVERSION N/A 09/16/2018   Procedure: TRANSESOPHAGEAL ECHOCARDIOGRAM (TEE);  Surgeon: Melrose Nakayama, MD;  Location: [place];  Service: Open Heart Surgery;  Laterality: N/A;    Social History   Socioeconomic History  . Marital status: Significant Other    Spouse name: Not on file  . Number of children: Not on file  . Years of education: Not on file  . Highest education level: High school  graduate  Occupational History  . Occupation: courier    Comment: [employer]  . Financial resource strain: Not hard at all  . Food insecurity    Worry: Never true    Inability: Never true  . Transportation needs    Medical: No    Non-medical: No  Tobacco Use  . Smoking status: Never Smoker  . Smokeless tobacco: Never Used  Substance and Sexual Activity  . Alcohol use: No  . Drug use: No  . Sexual activity: Not Currently    Partners: Female  Lifestyle  . Physical activity    Days per week: 0 days    Minutes per session: Not on file  . Stress: Not on file  Relationships  . Social connections    Talks on phone: More than three times a week    Gets together: Once a week    Attends religious service: 1 to 4 times per year    Active member of club or organization: Not on file    Attends meetings of clubs or organizations: Not on file    Relationship status: Living with partner  . Intimate partner violence    Fear of current or ex partner: No    Emotionally abused: No    Physically abused: No    Forced sexual activity: No  Other Topics Concern  . Not on file  Social History Narrative   Single   1 daughter 43     Family History  Problem Relation Age of Onset  . Cancer Mother        pancreatic  . Heart disease Father   . Diabetes Father      Current Outpatient Medications:  .  acetaminophen (TYLENOL) 500 MG tablet, Take 1,000 mg 2 (two) times daily as needed by mouth for moderate pain., Disp: , Rfl:  .  allopurinol (ZYLOPRIM) 300 MG tablet, Take 300 mg by mouth daily., Disp: , Rfl: 5 .  amiodarone (PACERONE) 200 MG tablet, Take 1 tablet (200 mg total) by mouth daily., Disp: 180 tablet, Rfl: 3 .  aspirin EC 81 MG EC tablet, Take 1 tablet (81 mg total) by mouth daily., Disp:  , Rfl:  .  AURYXIA 1 GM 210 MG(Fe) tablet, TAKE 2 TABLETS BY MOUTH THREE TIMES A DAY WITH MEALS, Disp: , Rfl:  .  cinacalcet (SENSIPAR) 30 MG tablet, Take 30 mg by mouth every  other day. , Disp: , Rfl:  .  Darbepoetin Alfa (ARANESP) 100 MCG/0.5ML SOSY injection, Inject 0.5 mLs (100 mcg total) into the vein every Monday with hemodialysis., Disp: 4.2 mL, Rfl:  .  Dextrose-Fructose-Sod Citrate (NAUZENE PO), Take 1-2 tablets 2 (two) times daily as needed by mouth (nausea)., Disp: , Rfl:  .  folic acid-vitamin b complex-vitamin c-selenium-zinc (DIALYVITE) 3 MG TABS tablet, Take 1 tablet by mouth daily., Disp: , Rfl:  .  lidocaine-prilocaine (EMLA) cream, Apply 1 application topically as needed (for fistula)., Disp: , Rfl:  .  metoprolol tartrate (LOPRESSOR) 25 MG tablet, TAKE 1/2 TABLETS (12.5 MG TOTAL) BY MOUTH 2 (TWO) TIMES DAILY., Disp: 30 tablet, Rfl: 1 .  midodrine (PROAMATINE) 10 MG tablet, Take 10 mg by mouth 2 (two) times daily as needed (for low BP)., Disp: , Rfl:  .  multivitamin (RENA-VIT) TABS tablet, Take 1 tablet by mouth daily., Disp: , Rfl:  .  omeprazole (PRILOSEC) 20 MG capsule, Take 20 mg by mouth daily., Disp: , Rfl:  .  pravastatin (PRAVACHOL) 20 MG tablet, Take 1 tablet (20 mg total) by mouth every evening., Disp: 90 tablet, Rfl: 3 .  predniSONE (DELTASONE) 5 MG tablet, Take 5 mg by mouth daily with breakfast. , Disp: , Rfl:  .  sevelamer carbonate (RENVELA) 800 MG tablet, Take 40,000 mg by mouth 3 (three) times daily with meals. 5 tabs per meal, Disp: , Rfl:  .  warfarin (COUMADIN) 1 MG tablet, Take 1 tablet (1 mg total) by mouth daily at 6 PM. (Patient taking differently: Take 1-2 mg by mouth See admin instructions. 1 mg all days , except 0.5 MG on Fridays..* OR AS DIRECTED), Disp: 90 tablet, Rfl: 1  Ct Abdomen Pelvis Wo Contrast  Result Date: 03/02/2019 CLINICAL DATA:  History of renal cell carcinoma status post bilateral nephrectomy. Post recurrence in transplanted kidney. Follow-up exam. EXAM: CT CHEST, ABDOMEN AND PELVIS WITHOUT CONTRAST TECHNIQUE: Multidetector CT imaging of the chest, abdomen and pelvis was performed following the standard  protocol without IV contrast. COMPARISON:  Multiple prior studies, most recent 11/10/2018. Most recent chest abdomen pelvis from 07/19/2016 FINDINGS: CT CHEST FINDINGS Cardiovascular: Calcified atherosclerosis. Signs of aortic valve replacement and coronary artery disease similar to prior exam. Heart size enlarged without pericardial effusion. Signs of coronary revascularization with CABG and LIMA grafting. Mediastinum/Nodes: Is no sign of adenopathy in the chest. The esophagus is normal. Thoracic inlet structures and axillary regions are unremarkable. Lungs/Pleura: It has no signs of suspicious mass or nodule. No signs of consolidation or pleural effusion. Airways are patent. Musculoskeletal: No signs of chest wall mass. CT ABDOMEN PELVIS FINDINGS Hepatobiliary: No sign of focal, suspicious hepatic lesion. The biliary tree is unremarkable aside from gallstones layering in the dependent gallbladder. Pancreas: No signs of pancreatic ductal dilation, inflammation or focal lesion. Spleen: Normal appearance of the spleen. Adrenals/Urinary Tract: Adrenal glands are normal. Post bilateral nephrectomy. No signs of nodularity in the nephrectomy bed. Post embolized right lower quadrant renal transplant, associated with calcification and atrophy is markedly atrophic as compared to the study of 07/18/2016. Stomach/Bowel: No signs of acute gastrointestinal process. The appendix is normal. Vascular/Lymphatic: Dense calcified atherosclerotic changes throughout the abdominal aorta, extending into branch vessels in the abdomen and pelvis. No signs of retroperitoneal or upper abdominal lymphadenopathy. No signs pelvic lymphadenopathy. Prostate is unremarkable on noncontrast scan. Reproductive: Prostate unremarkable as described. Urinary bladder under distended. Other: Postprocedural changes over the low abdomen. Small fat containing inguinal hernias. Musculoskeletal: Signs of renal osteodystrophy with coarsened bony trabeculation.  IMPRESSION: 1. No evidence of metastatic disease. 2. Post embolized right lower quadrant renal transplant. 3. Cholelithiasis. 4. Cardiomegaly with signs of aortic valve replacement and coronary  revascularization as before. Aortic Atherosclerosis (ICD10-I70.0). Electronically Signed   By: Zetta Bills M.D.   On: 03/02/2019 09:25   Ct Chest Wo Contrast  Result Date: 03/02/2019 CLINICAL DATA:  History of renal cell carcinoma status post bilateral nephrectomy. Post recurrence in transplanted kidney. Follow-up exam. EXAM: CT CHEST, ABDOMEN AND PELVIS WITHOUT CONTRAST TECHNIQUE: Multidetector CT imaging of the chest, abdomen and pelvis was performed following the standard protocol without IV contrast. COMPARISON:  Multiple prior studies, most recent 11/10/2018. Most recent chest abdomen pelvis from 07/19/2016 FINDINGS: CT CHEST FINDINGS Cardiovascular: Calcified atherosclerosis. Signs of aortic valve replacement and coronary artery disease similar to prior exam. Heart size enlarged without pericardial effusion. Signs of coronary revascularization with CABG and LIMA grafting. Mediastinum/Nodes: Is no sign of adenopathy in the chest. The esophagus is normal. Thoracic inlet structures and axillary regions are unremarkable. Lungs/Pleura: It has no signs of suspicious mass or nodule. No signs of consolidation or pleural effusion. Airways are patent. Musculoskeletal: No signs of chest wall mass. CT ABDOMEN PELVIS FINDINGS Hepatobiliary: No sign of focal, suspicious hepatic lesion. The biliary tree is unremarkable aside from gallstones layering in the dependent gallbladder. Pancreas: No signs of pancreatic ductal dilation, inflammation or focal lesion. Spleen: Normal appearance of the spleen. Adrenals/Urinary Tract: Adrenal glands are normal. Post bilateral nephrectomy. No signs of nodularity in the nephrectomy bed. Post embolized right lower quadrant renal transplant, associated with calcification and atrophy is markedly  atrophic as compared to the study of 07/18/2016. Stomach/Bowel: No signs of acute gastrointestinal process. The appendix is normal. Vascular/Lymphatic: Dense calcified atherosclerotic changes throughout the abdominal aorta, extending into branch vessels in the abdomen and pelvis. No signs of retroperitoneal or upper abdominal lymphadenopathy. No signs pelvic lymphadenopathy. Prostate is unremarkable on noncontrast scan. Reproductive: Prostate unremarkable as described. Urinary bladder under distended. Other: Postprocedural changes over the low abdomen. Small fat containing inguinal hernias. Musculoskeletal: Signs of renal osteodystrophy with coarsened bony trabeculation. IMPRESSION: 1. No evidence of metastatic disease. 2. Post embolized right lower quadrant renal transplant. 3. Cholelithiasis. 4. Cardiomegaly with signs of aortic valve replacement and coronary revascularization as before. Aortic Atherosclerosis (ICD10-I70.0). Electronically Signed   By: Zetta Bills M.D.   On: 03/02/2019 09:25   US Scrotum W/doppler  Result Date: 03/01/2019 CLINICAL DATA:  Left hydrocele. EXAM: SCROTAL ULTRASOUND DOPPLER ULTRASOUND OF THE TESTICLES TECHNIQUE: Complete ultrasound examination of the testicles, epididymis, and other scrotal structures was performed. Color and spectral Doppler ultrasound were also utilized to evaluate blood flow to the testicles. COMPARISON:  No prior. FINDINGS: Right testicle Measurements: 3.1 x 1.92.6 cm. No mass punctate calcifications noted. Left testicle Measurements: 4.9 x 2.3 x 3.1 cm. No mass or microlithiasis visualized. Right epididymis: Slightly heterogeneous right epididymis noted. Rounded echogenic focus noted in the epididymis most likely focal area of fat. Left epididymis: Difficult to visualize due to large left hydrocele. Hydrocele:  Large left hydrocele. Varicocele:  None visualized. Pulsed Doppler interrogation of both testes demonstrates normal low resistance arterial and  venous waveforms bilaterally. IMPRESSION: 1.  Large left hydrocele. 2. Heterogeneous right epididymis. Clinical correlation suggested to exclude right epididymitis. Electronically Signed   By: Marcello Moores  Register   On: 03/01/2019 07:42    No images are attached to the encounter.   CMP Latest Ref Rng & Units 12/15/2018  Glucose 70 - 99 mg/dL -  BUN 8 - 23 mg/dL -  Creatinine 0.61 - 1.24 mg/dL -  Sodium 135 - 145 mmol/L -  Potassium 3.5 - 5.1 mmol/L  5.7(H)  Chloride 98 - 111 mmol/L -  CO2 22 - 32 mmol/L -  Calcium 8.9 - 10.3 mg/dL -  Total Protein 6.5 - 8.1 g/dL -  Total Bilirubin 0.3 - 1.2 mg/dL -  Alkaline Phos 38 - 126 U/L -  AST 15 - 41 U/L -  ALT 0 - 44 U/L -   CBC Latest Ref Rng & Units 11/29/2018  WBC 4.0 - 10.5 K/uL 5.3  Hemoglobin 13.0 - 17.0 g/dL 11.6(L)  Hematocrit 39.0 - 52.0 % 37.0(L)  Platelets 150 - 400 K/uL 139(L)     Observation/objective: Appears in no acute distress of a video visit today.  Breathing is nonlabored  Assessment and plan: Patient is a 63 year old male with a history of renal cell carcinoma currently in remission.  He gets yearly surveillance scans  Renal cell carcinoma was back in 2008 and he has remained in remission since then and currently under clinical surveillance.  I have reviewed CT chest abdomen and pelvis images independently and discussed findings with the patient.   It did not reveal any evidence of malignancy.  Anemia of chronic kidney disease: Presently stable and his hemoglobin is 11.6.  He receives EPO through dialysis.  Follow-up instructions: I will see him back in 1 years time with CT scans prior  I discussed the assessment and treatment plan with the patient. The patient was provided an opportunity to ask questions and all were answered. The patient agreed with the plan and demonstrated an understanding of the instructions.   The patient was advised to call back or seek an in-person evaluation if the symptoms worsen or if the  condition fails to improve as anticipated.    Visit Diagnosis: 1. History of renal cell carcinoma     Dr. Randa Evens, MD, MPH [facility] at [facility] [phone] 03/10/2019 3:01 PM

## 2019-03-10 NOTE — Progress Notes (Deleted)
Cardiology Office Note    Date:  03/10/2019   ID:  Jeremy [name], DOB September 09, 1955, MRN [id number]  PCP:  Leone Haven, MD  Cardiologist:  Kathlyn Sacramento, MD  Electrophysiologist:  None   Chief Complaint: Follow up  History of Present Illness:   Jeremy [name] is a 63 y.o. male with history of ***  Past Medical History:  Diagnosis Date  . 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 Aortic stenosis    a. 2014 s/p mech AVR ([facility])-->chronic coumadin; b. 08/2018 Echo: Mild AS/AI. Nl fxn/gradient.  . Arthritis   . Atrial fibrillation ([place])   . CAD (coronary artery disease)    a. 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 02/2018 MV [facility]): large inf, infsept, inflat infarct w/ min rev, EF 30%; c. 06/2018 Cath: LM min irregs, LAD 60p, 95/30/73m, LCX min irregs, RCA 40p, 66m, 90d, RPAV 90, RPDA 95ost; c. 09/2018 CABGx3 (LIMA->LAD, VG->D1, VG->PDA).  . Chronic anticoagulation 06/2012   a. Coumadin in the setting of mech AVR and afib.  Marland Kitchen CPAP (continuous positive airway pressure) dependence   . Dialysis patient [facility]) 04/07/2006   Patient started HD around 1996,  had 1st transplant LLQ around 1998 until 2007 when they found renal cell cancer in transplant and both native kidneys, all were removed > went back on HD until 2nd transplant RLQ in 2014 which lasted 3 yrs; it was embolized for suspected acute rejection. Is currently on HD MWF in [place] w/ [facility] nephrology.  . ESRD (end stage renal disease) ([place])    a. CKD 2/2 to IgA nephropathy (dx age 48); b. 1999 s/p Kidney transplant; c. 2014 Bilat nephrectomy (transplanted kidney resected) in the setting of renal cell carcinoma; d. PD until 11/2017-->HD.  Marland Kitchen Heart murmur   . HFrEF (heart failure with reduced ejection fraction) ([place])    a. 07/2016 Echo: EF 55-60%; b. 02/2018 MV: EF 30%; c. 04/2018 Echo: EF 35%; d. 08/2018 Echo: EF 30-35%. Glob HK w/o rwma. Mildly reduced RV fxn. Mod to sev  dil LA. Nl AoV fxn.  Marland Kitchen Hx of colonoscopy    2009 by Dr. Laural Golden. Normal except for small submucosal lipoma. No evidence of polyps or colitis.  . Hyperlipidemia   . Hypertension   . Hypogonadism in male 07/25/2015  . Ischemic cardiomyopathy    a. 07/2016 Echo: EF 55-60%; b. 02/2018 MV: EF 30%; c. 04/2018 Echo: EF 35%; d. 08/2018 Echo: EF 30-35%.  . OSA (obstructive sleep apnea)    No CPAP  . Permanent atrial fibrillation ([facility])    a. CHA2DS2VASc = 3-->Coumadin (also mech AVR); b. 06/2018 [place] started 2/2 elevated rates.  . Renal cell cancer ([place])   . Renal cell carcinoma ([place]) 08/04/2016    Past Surgical History:  Procedure Laterality Date  . AORTIC VALVE REPLACEMENT  3 /11 /2014   At [place]    . CAPD INSERTION N/A 02/19/2017   Procedure: LAPAROSCOPIC INSERTION CONTINUOUS AMBULATORY PERITONEAL DIALYSIS  (CAPD) CATHETER;  Surgeon: Algernon Huxley, MD;  Location: [facility];  Service: General;  Laterality: N/A;  . CORONARY ANGIOPLASTY WITH STENT PLACEMENT    . CORONARY ARTERY BYPASS GRAFT N/A 09/16/2018   Procedure: CORONARY ARTERY BYPASS GRAFT times three      with left internal mammary artery and endoharvest of right leg greater saphenous vein;  Surgeon: Modesto Charon  C, MD;  Location: [place];  Service: Open Heart Surgery;  Laterality: N/A;  . DG AV DIALYSIS  SHUNT ACCESS EXIST*L* OR     left arm  . EPIGASTRIC HERNIA REPAIR N/A 02/19/2017   Procedure: HERNIA REPAIR EPIGASTRIC ADULT;  Surgeon: Robert Bellow, MD;  Location: [facility];  Service: General;  Laterality: N/A;  . EYE SURGERY     bilateral cataract  . HERNIA REPAIR     UMBILICAL HERNIA  . HERNIA REPAIR  02/19/2017  . INNER EAR SURGERY     20 years ago  . KIDNEY TRANSPLANT    . Peritoneal Dialysis access placement  02/19/2017  . REMOVAL OF A DIALYSIS CATHETER N/A 11/23/2017   Procedure: REMOVAL OF A PERITONEAL DIALYSIS CATHETER;  Surgeon: Algernon Huxley, MD;  Location: [facility];   Service: Vascular;  Laterality: N/A;  . RIGHT/LEFT HEART CATH AND CORONARY ANGIOGRAPHY Bilateral 06/07/2018   Procedure: RIGHT/LEFT HEART CATH AND CORONARY ANGIOGRAPHY;  Surgeon: Wellington Hampshire, MD;  Location: [facility];  Service: Cardiovascular;  Laterality: Bilateral;  . TEE WITHOUT CARDIOVERSION N/A 07/21/2016   Procedure: TRANSESOPHAGEAL ECHOCARDIOGRAM (TEE);  Surgeon: Wellington Hampshire, MD;  Location: [facility];  Service: Cardiovascular;  Laterality: N/A;  . TEE WITHOUT CARDIOVERSION N/A 09/16/2018   Procedure: TRANSESOPHAGEAL ECHOCARDIOGRAM (TEE);  Surgeon: Melrose Nakayama, MD;  Location: [place];  Service: Open Heart Surgery;  Laterality: N/A;    Current Medications: No outpatient medications have been marked as taking for the 03/17/19 encounter (Appointment) with Rise Mu, PA-C.    Allergies:   Statins and Sertraline   Social History   Socioeconomic History  . Marital status: Significant Other    Spouse name: Not on file  . Number of children: Not on file  . Years of education: Not on file  . Highest education level: High school graduate  Occupational History  . Occupation: courier    Comment: [place]  . Financial resource strain: Not hard at all  . Food insecurity    Worry: Never true    Inability: Never true  . Transportation needs    Medical: No    Non-medical: No  Tobacco Use  . Smoking status: Never Smoker  . Smokeless tobacco: Never Used  Substance and Sexual Activity  . Alcohol use: No  . Drug use: No  . Sexual activity: Not Currently    Partners: Female  Lifestyle  . Physical activity    Days per week: 0 days    Minutes per session: Not on file  . Stress: Not on file  Relationships  . Social connections    Talks on phone: More than three times a week    Gets together: Once a week    Attends religious service: 1 to 4 times per year    Active member of club or organization: Not on file    Attends meetings of  clubs or organizations: Not on file    Relationship status: Living with partner  Other Topics Concern  . Not on file  Social History Narrative   Single   1 daughter 30      Family History:  The patient's family history includes Cancer in his mother; Diabetes in his father; Heart disease in his father.  ROS:   ROS   EKGs/Labs/Other Studies Reviewed:    Studies reviewed were summarized above. The additional studies were reviewed today:  2D Echo 01/2019:  1. Left ventricular ejection fraction, by  visual estimation, is 45 to 50%. The left ventricle has normal function. Normal left ventricular size. There is moderately increased left ventricular hypertrophy.  2. Global right ventricle has mildly reduced systolic function.The right ventricular size is normal. No increase in right ventricular wall thickness.  3. Left atrial size was severely dilated.  4. A 26mm [employer] Jude mechanical prosthesis valve is present in the aortic position. Procedure Date: 06/15/12 Normal aortic valve prosthesis.Mean gradient within normal range for prosthetic valve  5. Indeterminate diastolic filling due to E-A fusion pattern of LV diastolic filling.  6. Aortic valve mean gradient measures 10.0 mmHg.  7. Aortic valve area, by VTI measures 1.85 cm.  8. TR signal is inadequate for assessing pulmonary artery systolic pressure.  EKG:  EKG is ordered today.  The EKG ordered today demonstrates ***  Recent Labs: 09/17/2018: Magnesium 2.1 11/29/2018: ALT 18; BUN 19; Creatinine, Ser 5.46; Hemoglobin 11.6; Platelets 139; Sodium 140 12/15/2018: Potassium 5.7  Recent Lipid Panel    Component Value Date/Time   CHOL 176 08/08/2016 0946   TRIG 158.0 (H) 08/08/2016 0946   HDL 50.50 08/08/2016 0946   CHOLHDL 3 08/08/2016 0946   VLDL 31.6 08/08/2016 0946   LDLCALC 94 08/08/2016 0946   LDLDIRECT 61.0 08/11/2017 1447    PHYSICAL EXAM:    VS:  There were no vitals taken for this visit.  BMI: There is no height or weight on  file to calculate BMI.  Physical Exam  Wt Readings from Last 3 Encounters:  02/22/19 191 lb (86.6 kg)  11/29/18 188 lb 3.2 oz (85.4 kg)  11/23/18 188 lb (85.3 kg)     ASSESSMENT & PLAN:   1. ***  Disposition: F/u with Dr. [name] or an APP in ***.   Medication Adjustments/Labs and Tests Ordered: Current medicines are reviewed at length with the patient today.  Concerns regarding medicines are outlined above. Medication changes, Labs and Tests ordered today are summarized above and listed in the Patient Instructions accessible in Encounters.   Signed, Christell Faith, PA-C 03/10/2019 3:17 PM     [address], [place] [address] [phone]

## 2019-03-14 ENCOUNTER — Telehealth: Payer: Self-pay | Admitting: *Deleted

## 2019-03-14 ENCOUNTER — Telehealth: Payer: Self-pay

## 2019-03-14 ENCOUNTER — Encounter: Payer: Self-pay | Admitting: *Deleted

## 2019-03-14 DIAGNOSIS — Z951 Presence of aortocoronary bypass graft: Secondary | ICD-10-CM

## 2019-03-14 NOTE — Telephone Encounter (Signed)
He will likely need to complete a visit to help determine if he needs an antibiotic. Please find out when he tested positive. Have his symptoms been improving? Does he any other symptoms other than what you listed?

## 2019-03-14 NOTE — Telephone Encounter (Signed)
Patient called to inform Dr Caryl Bis that he is positive for covid. Say that he have a cough and tiredness but nothing else. Would like a call back to let him know if there is something he need to be doing. Please call Ph# [phone] Jeremy [name],cma

## 2019-03-14 NOTE — Telephone Encounter (Signed)
Copied from [place] [id number]. Topic: General - Other >> Mar 14, 2019  3:59 PM Keene Breath wrote: Reason for CRM: Patient called to speak with the nurse about his COVID symptoms that he has. Patient said that [facility] said he should be taking some antibiotics.  Please advise and call patient to discuss at [phone]

## 2019-03-14 NOTE — Progress Notes (Signed)
Cardiac Individual Treatment Plan  Patient Details  Name: YAZIR KOERBER MRN: [id number] Date of Birth: 1956-04-06 Referring Provider:     Cardiac Rehab from 11/09/2018 in [facility] Cardiac and Pulmonary Rehab  Referring Provider  [name]      Initial Encounter Date:    Cardiac Rehab from 11/09/2018 in [facility] Cardiac and Pulmonary Rehab  Date  11/09/18      Visit Diagnosis: S/P CABG x 3  Patient's Home Medications on Admission:  Current Outpatient Medications:  .  acetaminophen (TYLENOL) 500 MG tablet, Take 1,000 mg 2 (two) times daily as needed by mouth for moderate pain., Disp: , Rfl:  .  allopurinol (ZYLOPRIM) 300 MG tablet, Take 300 mg by mouth daily., Disp: , Rfl: 5 .  amiodarone (PACERONE) 200 MG tablet, Take 1 tablet (200 mg total) by mouth daily., Disp: 180 tablet, Rfl: 3 .  aspirin EC 81 MG EC tablet, Take 1 tablet (81 mg total) by mouth daily., Disp:  , Rfl:  .  AURYXIA 1 GM 210 MG(Fe) tablet, TAKE 2 TABLETS BY MOUTH THREE TIMES A DAY WITH MEALS, Disp: , Rfl:  .  cinacalcet (SENSIPAR) 30 MG tablet, Take 30 mg by mouth every other day. , Disp: , Rfl:  .  Darbepoetin Alfa (ARANESP) 100 MCG/0.5ML SOSY injection, Inject 0.5 mLs (100 mcg total) into the vein every Monday with hemodialysis., Disp: 4.2 mL, Rfl:  .  Dextrose-Fructose-Sod Citrate (NAUZENE PO), Take 1-2 tablets 2 (two) times daily as needed by mouth (nausea)., Disp: , Rfl:  .  folic acid-vitamin b complex-vitamin c-selenium-zinc (DIALYVITE) 3 MG TABS tablet, Take 1 tablet by mouth daily., Disp: , Rfl:  .  lidocaine-prilocaine (EMLA) cream, Apply 1 application topically as needed (for fistula)., Disp: , Rfl:  .  metoprolol tartrate (LOPRESSOR) 25 MG tablet, TAKE 1/2 TABLETS (12.5 MG TOTAL) BY MOUTH 2 (TWO) TIMES DAILY., Disp: 30 tablet, Rfl: 1 .  midodrine (PROAMATINE) 10 MG tablet, Take 10 mg by mouth 2 (two) times daily as needed (for low BP)., Disp: , Rfl:  .  multivitamin (RENA-VIT) TABS tablet, Take 1 tablet by mouth daily.,  Disp: , Rfl:  .  omeprazole (PRILOSEC) 20 MG capsule, Take 20 mg by mouth daily., Disp: , Rfl:  .  pravastatin (PRAVACHOL) 20 MG tablet, Take 1 tablet (20 mg total) by mouth every evening., Disp: 90 tablet, Rfl: 3 .  predniSONE (DELTASONE) 5 MG tablet, Take 5 mg by mouth daily with breakfast. , Disp: , Rfl:  .  sevelamer carbonate (RENVELA) 800 MG tablet, Take 40,000 mg by mouth 3 (three) times daily with meals. 5 tabs per meal, Disp: , Rfl:  .  warfarin (COUMADIN) 1 MG tablet, Take 1 tablet (1 mg total) by mouth daily at 6 PM. (Patient taking differently: Take 1-2 mg by mouth See admin instructions. 1 mg all days , except 0.5 MG on Fridays..* OR AS DIRECTED), Disp: 90 tablet, Rfl: 1  Past Medical History: Past Medical History:  Diagnosis Date  . 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 Aortic stenosis    a. 2014 s/p mech AVR ([facility])-->chronic coumadin; b. 08/2018 Echo: Mild AS/AI. Nl fxn/gradient.  . Arthritis   . Atrial fibrillation ([place])   . CAD (coronary artery disease)    a. 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 02/2018 MV ([facility]: large inf, infsept, inflat infarct w/ min rev, EF 30%; c. 06/2018 Cath: LM min  irregs, LAD 60p, 95/30/19m, LCX min irregs, RCA 40p, 48m 90d, RPAV 90, RPDA 95ost; c. 09/2018 CABGx3 (LIMA->LAD, VG->D1, VG->PDA).  . Chronic anticoagulation 06/2012   a. Coumadin in the setting of mech AVR and afib.  .Marland KitchenCPAP (continuous positive airway pressure) dependence   . Dialysis patient ([facility] 04/07/2006   Patient started HD around 1996,  had 1st transplant LLQ around 1998 until 2007 when they found renal cell cancer in transplant and both native kidneys, all were removed > went back on HD until 2nd transplant RLQ in 2014 which lasted 3 yrs; it was embolized for suspected acute rejection. [place]  . ESRD (end stage renal disease) ([place]    a. CKD 2/2 to IgA  nephropathy (dx age 63; b. 1999 s/p Kidney transplant; c. 2014 Bilat nephrectomy (transplanted kidney resected) in the setting of renal cell carcinoma; d. PD until 11/2017-->HD.  .Marland KitchenHeart murmur   . HFrEF (heart failure with reduced ejection fraction) ([place]    a. 07/2016 Echo: EF 55-60%; b. 02/2018 MV: EF 30%; c. 04/2018 Echo: EF 35%; d. 08/2018 Echo: EF 30-35%. Glob HK w/o rwma. Mildly reduced RV fxn. Mod to sev dil LA. Nl AoV fxn.  .Marland KitchenHx of colonoscopy    2009 by Dr. RLaural Golden Normal except for small submucosal lipoma. No evidence of polyps or colitis.  . Hyperlipidemia   . Hypertension   . Hypogonadism in male 07/25/2015  . Ischemic cardiomyopathy    a. 07/2016 Echo: EF 55-60%; b. 02/2018 MV: EF 30%; c. 04/2018 Echo: EF 35%; d. 08/2018 Echo: EF 30-35%.  . OSA (obstructive sleep apnea)    No CPAP  . Permanent atrial fibrillation ([facility])    a. CHA2DS2VASc = 3-->Coumadin (also mech AVR); b. 06/2018 Amio started 2/2 elevated rates.  . [place] cancer [place]   . [place]    Tobacco Use: Social History   Tobacco Use  Smoking Status Never Smoker  Smokeless Tobacco Never Used    Labs: Recent Review Flowsheet Data    Labs for ITP Cardiac and Pulmonary Rehab Latest Ref Rng & Units 09/16/2018 09/16/2018 09/16/2018 09/16/2018 09/16/2018   Cholestrol 0 - 200 mg/dL - - - - -   LDLCALC 0 - 99 mg/dL - - - - -   LDLDIRECT mg/dL - - - - -   HDL >39.00 mg/dL - - - - -   Trlycerides 0.0 - 149.0 mg/dL - - - - -   Hemoglobin A1c 4.8 - 5.6 % - - - - -   PHART 7.350 - 7.450 7.393 7.390 7.375 7.329(L) -   PCO2ART 32.0 - 48.0 mmHg 45.2 43.1 44.0 48.1(H) -   HCO3 20.0 - 28.0 mmol/L 27.6 26.3 25.7 25.2 -   TCO2 22 - 32 mmol/L _0 ACIDBASEDEF 0.0 - 2.0 mmol/L - - - 1.0 -   O2SAT % 98.0 98.0 99.0 99.0 -       Exercise Target Goals: Exercise Program Goal: Individual exercise prescription set using results from initial 6 min walk test and THRR while considering  patient's  activity barriers and safety.   Exercise Prescription Goal: Initial exercise prescription builds to 30-45 minutes a day of aerobic activity, 2-3 days per week.  Home exercise guidelines will be given to patient during program as part of exercise prescription that the participant will acknowledge.  Activity Barriers & Risk Stratification: Activity Barriers & Cardiac  Risk Stratification - 11/04/18 1054      Activity Barriers & Cardiac Risk Stratification   Activity Barriers  Joint Problems;Muscular Weakness    Cardiac Risk Stratification  High       6 Minute Walk: 6 Minute Walk    Row Name 11/09/18 1543         6 Minute Walk   Distance  1288 feet     Walk Time  6 minutes     # of Rest Breaks  0     MPH  2.44     METS  3.6     RPE  11     VO2 Peak  12.6     Symptoms  No     Resting HR  74 bpm     Resting BP  122/60     Max Ex. HR  116 bpm     Max Ex. BP  134/62     2 Minute Post BP  112/62        Oxygen Initial Assessment:   Oxygen Re-Evaluation:   Oxygen Discharge (Final Oxygen Re-Evaluation):   Initial Exercise Prescription: Initial Exercise Prescription - 11/09/18 1500      Date of Initial Exercise RX and Referring Provider   Date  11/09/18    Referring Provider  [name]      Treadmill   MPH  2.5    Grade  2    Minutes  15    METs  3.6      NuStep   Level  4    SPM  80    Minutes  15    METs  3.6      REL-XR   Level  3    Speed  50    Minutes  15    METs  3.6      Prescription Details   Frequency (times per week)  2    Duration  Progress to 30 minutes of continuous aerobic without signs/symptoms of physical distress      Intensity   THRR 40-80% of Max Heartrate  115-144    Ratings of Perceived Exertion  11-15    Perceived Dyspnea  0-4      Progression   Progression  Continue to progress workloads to maintain intensity without signs/symptoms of physical distress.      Resistance Training   Training Prescription  Yes    Weight  3 lb     Reps  10-15       Perform Capillary Blood Glucose checks as needed.  Exercise Prescription Changes: Exercise Prescription Changes    Row Name 11/10/18 1200 11/18/18 0800 11/26/18 0800 11/30/18 1500 12/15/18 1100     Response to Exercise   Blood Pressure (Admit)  122/60  -  138/68  132/60  114/58   Blood Pressure (Exercise)  134/62  -  150/80  138/68  118/60   Blood Pressure (Exit)  112/62  -  104/60  114/60  110/56   Heart Rate (Admit)  74 bpm  -  76 bpm  83 bpm  90 bpm   Heart Rate (Exercise)  116 bpm  -  111 bpm  115 bpm  111 bpm   Heart Rate (Exit)  83 bpm  -  89 bpm  89 bpm  89 bpm   Rating of Perceived Exertion (Exercise)  11  -  _0 Symptoms  none  -  none  none  none  Duration  -  -  Progress to 30 minutes of  aerobic without signs/symptoms of physical distress  Continue with 30 min of aerobic exercise without signs/symptoms of physical distress.  Continue with 30 min of aerobic exercise without signs/symptoms of physical distress.   Intensity  -  -  THRR unchanged  THRR unchanged  THRR unchanged     Progression   Progression  -  -  Continue to progress workloads to maintain intensity without signs/symptoms of physical distress.  Continue to progress workloads to maintain intensity without signs/symptoms of physical distress.  Continue to progress workloads to maintain intensity without signs/symptoms of physical distress.   Average METs  -  -  2.9  2.8  2.8     Resistance Training   Training Prescription  -  -  Yes  Yes  Yes   Weight  -  -  _0 Reps  -  -  10-15  10-15  10-15     Interval Training   Interval Training  -  -  No  -  No     Treadmill   MPH  -  -  2.5  -  -   Grade  -  -  2  -  -   Minutes  -  -  15  -  -   METs  -  -  3.6  -  -     NuStep   Level  -  -  _1 SPM  -  -  50  50  50   Minutes  -  -  _2 METs  -  -  2.8  3.6  3.6     REL-XR   Level  -  -  1._3 Speed  -  -  50  50  50   Minutes  -  -  _4 METs  -  -  1.9  1.9  1.9     Home Exercise Plan   Plans to continue exercise at  -  Home (comment) walking  Home (comment) walking  Home (comment) walking  Home (comment) walking   Frequency  -  Add 2 additional days to program exercise sessions.  Add 2 additional days to program exercise sessions.  Add 2 additional days to program exercise sessions.  Add 2 additional days to program exercise sessions.   Initial Home Exercises Provided  -  11/18/18  11/18/18  11/18/18  11/18/18   Row Name 01/19/19 1000 01/26/19 1200 02/09/19 1000 02/24/19 1300 03/09/19 1300     Response to Exercise   Blood Pressure (Admit)  124/60  110/60  124/60  124/60  140/66   Blood Pressure (Exercise)  140/68  124/68  110/70  164/76  132/70   Blood Pressure (Exit)  134/58  108/58  118/60  122/60  110/70   Heart Rate (Admit)  90 bpm  83 bpm  99 bpm  78 bpm  96 bpm   Heart Rate (Exercise)  112 bpm  113 bpm  121 bpm  104 bpm  111 bpm   Heart Rate (Exit)  96 bpm  93 bpm  94 bpm  92 bpm  109 bpm   Rating of Perceived Exertion (Exercise)  _5 Symptoms  none  noone  none  none  none   Duration  Continue with 30 min of aerobic exercise without signs/symptoms of physical distress.  Continue with 30 min of aerobic exercise without signs/symptoms of physical distress.  Continue with 30 min of aerobic exercise without signs/symptoms of physical distress.  Continue with 30 min of aerobic exercise without signs/symptoms of physical distress.  Continue with 30 min of aerobic exercise without signs/symptoms of physical distress.   Intensity  THRR unchanged  THRR unchanged  THRR unchanged  THRR unchanged  THRR unchanged     Progression   Progression  Continue to progress workloads to maintain intensity without signs/symptoms of physical distress.  Continue to progress workloads to maintain intensity without signs/symptoms of physical distress.  Continue to progress workloads to maintain intensity without signs/symptoms  of physical distress.  Continue to progress workloads to maintain intensity without signs/symptoms of physical distress.  Continue to progress workloads to maintain intensity without signs/symptoms of physical distress.   Average METs  3  3  3.19  3.6  2.4     Resistance Training   Training Prescription  Yes  Yes  Yes  Yes  Yes   Weight  6 lbs  6 lb  6 lbs  6 lb  6 lb   Reps  10-15  10-15  10-15  10-15  10-15     Interval Training   Interval Training  No  No  No  No  Yes   Equipment  -  -  -  -  Recumbant Elliptical;NuStep   Comments  -  -  -  -  30 sec on 1 min off     Treadmill   MPH  2.5  -  2.5  2.5  2.5   Grade  2.5  -  2.5  2.5  2.5   Minutes  15  -  _0 METs  3.78  -  3.78  3.78  3.78     NuStep   Level  _1 Minutes  _2 METs  3.4  3.5  3.4  3.8  3.8     Recumbant Elliptical   Level  -  -  -  -  2.3   Minutes  -  -  -  -  15   METs  -  -  -  -  2     REL-XR   Level  2  2.3  2.3  -  -   Minutes  _3 -  -   METs  1.8  2.4  2.4  -  -     Home Exercise Plan   Plans to continue exercise at  Home (comment) walking  Home (comment) walking  Home (comment) walking  Home (comment) walking  Home (comment) walking   Frequency  Add 2 additional days to program exercise sessions.  Add 2 additional days to program exercise sessions.  Add 2 additional days to program exercise sessions.  Add 2 additional days to program exercise sessions.  Add 2 additional days to program exercise sessions.   Initial Home Exercises Provided  11/18/18  11/18/18  11/18/18  11/18/18  11/18/18      Exercise Comments: Exercise Comments    Row Name 11/11/18 0840 11/16/18 8251 12/09/18 8984  Exercise Comments  First full day of exercise!  Patient was oriented to gym and equipment including functions, settings, policies, and procedures.  Patient's individual exercise prescription and treatment plan were reviewed.  All starting workloads were established  based on the results of the 6 minute walk test done at initial orientation visit.  The plan for exercise progression was also introduced and progression will be customized based on patient's performance and goals.  Jayjay stated that after he went home last week his chest was very sore.  Remains sore today. He was advised to decrease or not use weights today.  Also to call his doctor if pain continues  Tiquan had lower BP today after dialysis yesterday        Exercise Goals and Review: Exercise Goals    Row Name 11/09/18 1548             Exercise Goals   Increase Physical Activity  Yes       Expected Outcomes  Short Term: Attend rehab on a regular basis to increase amount of physical activity.;Long Term: Add in home exercise to make exercise part of routine and to increase amount of physical activity.;Long Term: Exercising regularly at least 3-5 days a week.       Increase Strength and Stamina  Yes       Intervention  Provide advice, education, support and counseling about physical activity/exercise needs.;Develop an individualized exercise prescription for aerobic and resistive training based on initial evaluation findings, risk stratification, comorbidities and participant's personal goals.       Expected Outcomes  Short Term: Increase workloads from initial exercise prescription for resistance, speed, and METs.;Short Term: Perform resistance training exercises routinely during rehab and add in resistance training at home;Long Term: Improve cardiorespiratory fitness, muscular endurance and strength as measured by increased METs and functional capacity (6MWT)       Able to understand and use rate of perceived exertion (RPE) scale  Yes       Intervention  Provide education and explanation on how to use RPE scale       Expected Outcomes  Short Term: Able to use RPE daily in rehab to express subjective intensity level;Long Term:  Able to use RPE to guide intensity level when exercising independently        Able to understand and use Dyspnea scale  Yes       Intervention  Provide education and explanation on how to use Dyspnea scale       Expected Outcomes  Short Term: Able to use Dyspnea scale daily in rehab to express subjective sense of shortness of breath during exertion;Long Term: Able to use Dyspnea scale to guide intensity level when exercising independently       Knowledge and understanding of Target Heart Rate Range (THRR)  Yes       Intervention  Provide education and explanation of THRR including how the numbers were predicted and where they are located for reference       Expected Outcomes  Short Term: Able to state/look up THRR;Short Term: Able to use daily as guideline for intensity in rehab;Long Term: Able to use THRR to govern intensity when exercising independently       Able to check pulse independently  Yes       Intervention  Provide education and demonstration on how to check pulse in carotid and radial arteries.;Review the importance of being able to check your own pulse for safety during independent exercise  Expected Outcomes  Short Term: Able to explain why pulse checking is important during independent exercise;Long Term: Able to check pulse independently and accurately       Understanding of Exercise Prescription  Yes       Intervention  Provide education, explanation, and written materials on patient's individual exercise prescription       Expected Outcomes  Short Term: Able to explain program exercise prescription;Long Term: Able to explain home exercise prescription to exercise independently          Exercise Goals Re-Evaluation : Exercise Goals Re-Evaluation    Row Name 11/11/18 0840 11/18/18 0819 11/26/18 0816 11/30/18 1544 12/15/18 1120     Exercise Goal Re-Evaluation   Exercise Goals Review  Increase Physical Activity;Increase Strength and Stamina;Able to understand and use rate of perceived exertion (RPE) scale;Knowledge and understanding of Target Heart  Rate Range (THRR);Understanding of Exercise Prescription  Increase Physical Activity;Increase Strength and Stamina;Able to understand and use rate of perceived exertion (RPE) scale;Knowledge and understanding of Target Heart Rate Range (THRR);Understanding of Exercise Prescription;Able to understand and use Dyspnea scale;Able to check pulse independently  Increase Physical Activity;Increase Strength and Stamina;Able to understand and use rate of perceived exertion (RPE) scale;Knowledge and understanding of Target Heart Rate Range (THRR);Understanding of Exercise Prescription  Increase Physical Activity;Increase Strength and Stamina;Able to understand and use rate of perceived exertion (RPE) scale;Able to understand and use Dyspnea scale;Knowledge and understanding of Target Heart Rate Range (THRR);Understanding of Exercise Prescription  Increase Physical Activity;Able to understand and use rate of perceived exertion (RPE) scale;Increase Strength and Stamina;Knowledge and understanding of Target Heart Rate Range (THRR);Able to check pulse independently;Understanding of Exercise Prescription   Comments  Reviewed RPE scale, THR and program prescription with pt today.  Pt voiced understanding and was given a copy of goals to take home.  Reviewed home exercise with pt today.  Pt plans to walk at home for exercise.  Reviewed THR, pulse, RPE, sign and symptoms, NTG use, and when to call 911 or MD.  Also discussed weather considerations and indoor options.  Pt voiced understanding.  Vincient is doing well in rehab so far.  He plans to increase levels at next session.  he attends 2 days per week due to dialysis on other days.  Marrio increased levels on both machines today.  he is walking and doing strength work 2 days per week outside class.  Alejandra attends consistently and works at appropriate RPE.  Staff will monitor progress.   Expected Outcomes  Short: Use RPE daily to regulate intensity. Long: Follow program prescription in  THR.  Short: Start to add in walking on off days.  Long: Continue to follow program prescription  Short - continue to attend consistently Long :  increase overall MET level  Short - continue home exercise Long - maintain fitness independently  Short - continue to gradually build intensity Long - improve overall MET level   Row Name 01/18/19 1550 01/26/19 1242 02/09/19 1043 02/15/19 0826 02/24/19 1351     Exercise Goal Re-Evaluation   Exercise Goals Review  Increase Physical Activity;Increase Strength and Stamina;Understanding of Exercise Prescription  Increase Physical Activity;Increase Strength and Stamina;Able to understand and use rate of perceived exertion (RPE) scale;Knowledge and understanding of Target Heart Rate Range (THRR);Able to check pulse independently;Understanding of Exercise Prescription  Increase Physical Activity;Increase Strength and Stamina;Understanding of Exercise Prescription  Increase Physical Activity;Increase Strength and Stamina  Increase Physical Activity;Increase Strength and Stamina;Able to understand and use rate of perceived  exertion (RPE) scale;Knowledge and understanding of Target Heart Rate Range (THRR);Understanding of Exercise Prescription   Comments  Roe continues to do well in rehab!  He is still making improvements!  We will continue to monitor his progress.  Jed has increased to 6 lb for strength and works at [employer].  He does exercise on days he doesnt go to dialysis.  Stace continues to do well in rehab.  He is up to level 2.3 on the recumbenet elliptical and 2.5 mph on the treadmill.  We will continue to monitor his progress.  Markice has been doing some dumbell work and squats at home. He has been walking on the weekends about 1 mile a day. His hip has been hurting and has since had it checked out but none of his doctors have come up with any diagnosis. Siddhant wants to feel better but thinks dialysis is taken a toll on him. He says that when he gets home from dialysis he  is tired and does not do too much.  Severn coontinues to do well with exercise.  Staff reviewed the figure 4 stretch for his hip since there is no diganosis of bone issues.  He will try the stretch at home.   Expected Outcomes  Short: Continue to increase workloads.  Long: Continue to improve stamina.  Short - continue to exercise at home Long - increase overall MET level  Short: Continue to boost workloads and talk about intervals.  Long: Continue improve stamina.  Short: continue exercise at home twice a week. Long: maintain a workout routine independently post rehab.  Short: add figure 4 stretch to home exercise Long :  maintain fitness on his own   [place] Name 03/09/19 1347             Exercise Goal Re-Evaluation   Exercise Goals Review  Increase Physical Activity;Increase Strength and Stamina;Understanding of Exercise Prescription       Comments  Marcelus has been doing well in rehab.  He will be out for a bit due to a COVID-19 exposure. He is up to level 6 on the NuStep.  We will continue to monitor his progress.       Expected Outcomes  Short: Continue to exercise at home.  Long: Continue to improve stamina.          Discharge Exercise Prescription (Final Exercise Prescription Changes): Exercise Prescription Changes - 03/09/19 1300      Response to Exercise   Blood Pressure (Admit)  140/66    Blood Pressure (Exercise)  132/70    Blood Pressure (Exit)  110/70    Heart Rate (Admit)  96 bpm    Heart Rate (Exercise)  111 bpm    Heart Rate (Exit)  109 bpm    Rating of Perceived Exertion (Exercise)  14    Symptoms  none    Duration  Continue with 30 min of aerobic exercise without signs/symptoms of physical distress.    Intensity  THRR unchanged      Progression   Progression  Continue to progress workloads to maintain intensity without signs/symptoms of physical distress.    Average METs  2.4      Resistance Training   Training Prescription  Yes    Weight  6 lb    Reps  10-15       Interval Training   Interval Training  Yes    Equipment  Recumbant Elliptical;NuStep    Comments  30 sec on 1 min off  Treadmill   MPH  2.5    Grade  2.5    Minutes  15    METs  3.78      NuStep   Level  6    Minutes  15    METs  3.8      Recumbant Elliptical   Level  2.3    Minutes  15    METs  2      Home Exercise Plan   Plans to continue exercise at  Home (comment)   walking   Frequency  Add 2 additional days to program exercise sessions.    Initial Home Exercises Provided  11/18/18       Nutrition:  Target Goals: Understanding of nutrition guidelines, daily intake of sodium <1553m, cholesterol <2064m calories 30% from fat and 7% or less from saturated fats, daily to have 5 or more servings of fruits and vegetables.  Biometrics: Pre Biometrics - 11/09/18 1549      Pre Biometrics   Height  5' 9.75" (1.772 m)    Weight  188 lb 8 oz (85.5 kg)    BMI (Calculated)  27.23    Single Leg Stand  7.06 seconds        Nutrition Therapy Plan and Nutrition Goals: Nutrition Therapy & Goals - 12/07/18 0812      Personal Nutrition Goals   Comments  Pt reported in the hospital (renal and HH diet) his fluid retention dropped and his stomach was not as "bloated". Pt has renal diettian follwoing him; went over CKD on dialysis basics and provided handouts. Went over gout basics and provided handout (pt reports organ meats, steak and pork chops are his most noticible triggers and is on medication). Pt is on coumadin and continues to eat a consistent amount of leafy greens (working with doctor). Discussed Hh eating in the context of his other issues, discussed how it will be a balancing act and we may have to fine tune things. Pt reports protein is WNL since including eggs, he takes his renal vitamin after dialysis, and he is on a phosphate binder. Pt reports eating cheerios or oatmeal on non-dialysis days and nothing on dialysis days for B. L both days is eggs with tu[place]acon. S  grits and peanut butter. D chicken or fish and vegtables. Pt reports sometimes eating fried food but cut out fried foods and red meat for the most part. Discussed low Na eating. Pt is also mindful of the amount of liquids he consumes (usually water, tea, or juice).       Nutrition Assessments: Nutrition Assessments - 11/11/18 1138      MEDFICTS Scores   Pre Score  43       Nutrition Goals Re-Evaluation: Nutrition Goals Re-Evaluation    Row Name 12/07/18 0809 12/21/18 0856 02/08/19 1133 03/01/19 0838       Goals   Current Weight  188 lb (85.3 kg)  190 lb (86.2 kg)  -  191 lb 11.2 oz (87 kg)    Nutrition Goal  ST: +1 serving of F/V/day (now having 2/day) LT: gain strength and get back to doing what he was doing before, reducing fluid retention.  ST: continue renal diet LT: gain strength and get back to doing what he was doing before, reducing fluid retention.  ST: continue renal diet LT: gain strength and get back to doing what he was doing before, reducing fluid retention.  ST: continue renal diet LT: gain strength and get back to  doing what he was doing before, reducing fluid retention.    Comment  starting him on vitamin D. Energy has been good, but having issues with joints (knees and hips) - not sure if medications - sent message to cardiologist. Told him to refer to the renal handout I gave him to see fruits and vegetables that work with his renal diet. Pt to have report card from dialysis this week. Pt reports energy is good. Pt now limits fried foods and instead uses shake n' bake for chicken and BBQ on pork chops baked - advised to just be mindful of the salt in BBQ sauce and shake n' Bake. Pt reports dry weight is 85 kg.  Pt reports per dialysis report card his K+ was up, but is now coming back down. He believes it was the nacho basket he got from [employer] with tomatoes and beans. Pt reports being tired of the renal diet after being on dialysis so long and expressed frustration, but  acceptance. Encouraged pt that he was doing a good job. Pt reports eating the foods less desired on the renal and HH diet in moderation (ex. 1 piece of bacon instead of all they give yoyu when eating out). Pt reports not needing anything from RD at this time.  Continue with current changes  Answered questions about zinc. Pt at dry weight today. trying to watch what he is eating, not going out as much. Reports "trying to get in the groove", focusing on small steps.    Expected Outcome  Manage edema, dialysis, and gout.  Manage edema, dialysis, and gout.  Manage edema, dialysis, and gout.  Manage edema, dialysis, and gout.       Nutrition Goals Discharge (Final Nutrition Goals Re-Evaluation): Nutrition Goals Re-Evaluation - 03/01/19 1610      Goals   Current Weight  191 lb 11.2 oz (87 kg)    Nutrition Goal  ST: continue renal diet LT: gain strength and get back to doing what he was doing before, reducing fluid retention.    Comment  Answered questions about zinc. Pt at dry weight today. trying to watch what he is eating, not going out as much. Reports "trying to get in the groove", focusing on small steps.    Expected Outcome  Manage edema, dialysis, and gout.       Psychosocial: Target Goals: Acknowledge presence or absence of significant depression and/or stress, maximize coping skills, provide positive support system. Participant is able to verbalize types and ability to use techniques and skills needed for reducing stress and depression.   Initial Review & Psychosocial Screening: Initial Psych Review & Screening - 11/04/18 1049      Initial Review   Current issues with  Current Stress Concerns    Source of Stress Concerns  Chronic Illness    Comments  Shantel is on HD M,W,F. He has a good support system and is handling it well. He stays on top of managing his health, but it gets exhausting at times.      Family Dynamics   Good Support System?  Yes      Barriers   Psychosocial barriers to  participate in program  There are no identifiable barriers or psychosocial needs.      Screening Interventions   Interventions  Encouraged to exercise;Provide feedback about the scores to participant    Expected Outcomes  Short Term goal: Utilizing psychosocial counselor, staff and physician to assist with identification of specific Stressors or current issues  interfering with healing process. Setting desired goal for each stressor or current issue identified.;Long Term Goal: Stressors or current issues are controlled or eliminated.;Short Term goal: Identification and review with participant of any Quality of Life or Depression concerns found by scoring the questionnaire.;Long Term goal: The participant improves quality of Life and PHQ9 Scores as seen by post scores and/or verbalization of changes       Quality of Life Scores:  Quality of Life - 11/11/18 1138      Quality of Life   Select  Quality of Life      Quality of Life Scores   Health/Function Pre  20.73 %    Socioeconomic Pre  19 %    Psych/Spiritual Pre  20.86 %    Family Pre  20.6 %    GLOBAL Pre  20.38 %      Scores of 19 and below usually indicate a poorer quality of life in these areas.  A difference of  2-3 points is a clinically meaningful difference.  A difference of 2-3 points in the total score of the Quality of Life Index has been associated with significant improvement in overall quality of life, self-image, physical symptoms, and general health in studies assessing change in quality of life.  PHQ-9: Recent Review Flowsheet Data    Depression screen [facility] 2/9 11/09/2018 03/19/2018   Decreased Interest 0 0   Down, Depressed, Hopeless 0 0   PHQ - 2 Score 0 0   Altered sleeping 0 -   Tired, decreased energy 0 -   Change in appetite 0 -   Feeling bad or failure about yourself  0 -   Trouble concentrating 0 -   Moving slowly or fidgety/restless 0 -   Suicidal thoughts 0 -   PHQ-9 Score 0 -   Difficult doing  work/chores Not difficult at all -     Interpretation of Total Score  Total Score Depression Severity:  1-4 = Minimal depression, 5-9 = Mild depression, 10-14 = Moderate depression, 15-19 = Moderately severe depression, 20-27 = Severe depression   Psychosocial Evaluation and Intervention:   Psychosocial Re-Evaluation: Psychosocial Re-Evaluation    Row Name 12/23/18 0810 01/25/19 0814 02/15/19 0831         Psychosocial Re-Evaluation   Current issues with  Current Stress Concerns;Current Sleep Concerns  Current Stress Concerns  None Identified;Current Stress Concerns     Comments  Samuel says he has no current stress or sleep concerns  Octavius still has no stress concerns  He keeps his mind off stress by doing other activities like working on cars.  Wenzel is trying to stay positive with all of his health issues. He has made his peace with his health issues but is willing to stay as healthy as he can.     Expected Outcomes  Short - continue exercise Long - manage stress long term  Short - continue hobbies to manage stress Long - keep stress levels low  Short: exrecise and stay positive. Long: maintain a positive outlook on his health.     Interventions  Encouraged to attend Cardiac Rehabilitation for the exercise  -  Encouraged to attend Cardiac Rehabilitation for the exercise     Continue Psychosocial Services   -  -  Follow up required by staff        Psychosocial Discharge (Final Psychosocial Re-Evaluation): Psychosocial Re-Evaluation - 02/15/19 0831      Psychosocial Re-Evaluation   Current issues with  None Identified;Current Stress  Concerns    Comments  Bence is trying to stay positive with all of his health issues. He has made his peace with his health issues but is willing to stay as healthy as he can.    Expected Outcomes  Short: exrecise and stay positive. Long: maintain a positive outlook on his health.    Interventions  Encouraged to attend Cardiac Rehabilitation for the exercise     Continue Psychosocial Services   Follow up required by staff       Vocational Rehabilitation: Provide vocational rehab assistance to qualifying candidates.   Vocational Rehab Evaluation & Intervention: Vocational Rehab - 11/04/18 1048      Initial Vocational Rehab Evaluation & Intervention   Assessment shows need for Vocational Rehabilitation  No       Education: Education Goals: Education classes will be provided on a variety of topics geared toward better understanding of heart health and risk factor modification. Participant will state understanding/return demonstration of topics presented as noted by education test scores.  Learning Barriers/Preferences: Learning Barriers/Preferences - 11/04/18 1048      Learning Barriers/Preferences   Learning Barriers  None    Learning Preferences  None       Education Topics:  AED/CPR: - Group verbal and written instruction with the use of models to demonstrate the basic use of the AED with the basic ABC's of resuscitation.   General Nutrition Guidelines/Fats and Fiber: -Group instruction provided by verbal, written material, models and posters to present the general guidelines for heart healthy nutrition. Gives an explanation and review of dietary fats and fiber.   Cardiac Rehab from 02/24/2019 in [facility] Cardiac and Pul[facility] Re[facility]  Date  02/24/19  Educator  mc  Instruction Review Code  1- Verbalizes Understanding      Controlling Sodium/Reading Food Labels: -Group verbal and written material supporting the discussion of sodium use in heart healthy nutrition. Review and explanation with models, verbal and written materials for utilization of the food label.   Exercise Physiology & General Exercise Guidelines: - Group verbal and written instruction with models to review the exercise physiology of the cardiovascular system and associated critical values. Provides general exercise guidelines with specific guidelines to those with  heart or lung disease.    Aerobic Exercise & Resistance Training: - Gives group verbal and written instruction on the various components of exercise. Focuses on aerobic and resistive training programs and the benefits of this training and how to safely progress through these programs..   Flexibility, Balance, Mind/Body Relaxation: Provides group verbal/written instruction on the benefits of flexibility and balance training, including mind/body exercise modes such as yoga, pilates and tai chi.  Demonstration and skill practice provided.   Cardiac Rehab from 02/24/2019 in [facility] Cardiac and Pulmonary Rehab  Date  02/24/19  Educator  as  Instruction Review Code  1- Verbalizes Understanding      Stress and Anxiety: - Provides group verbal and written instruction about the health risks of elevated stress and causes of high stress.  Discuss the correlation between heart/lung disease and anxiety and treatment options. Review healthy ways to manage with stress and anxiety.   Depression: - Provides group verbal and written instruction on the correlation between heart/lung disease and depressed mood, treatment options, and the stigmas associated with seeking treatment.   Anatomy & Physiology of the Heart: - Group verbal and written instruction and models provide basic cardiac anatomy and physiology, with the coronary electrical and arterial systems. Review of Valvular disease and  Heart Failure   Cardiac Procedures: - Group verbal and written instruction to review commonly prescribed medications for heart disease. Reviews the medication, class of the drug, and side effects. Includes the steps to properly store meds and maintain the prescription regimen. (beta blockers and nitrates)   Cardiac Medications I: - Group verbal and written instruction to review commonly prescribed medications for heart disease. Reviews the medication, class of the drug, and side effects. Includes the steps to properly  store meds and maintain the prescription regimen.   Cardiac Medications II: -Group verbal and written instruction to review commonly prescribed medications for heart disease. Reviews the medication, class of the drug, and side effects. (all other drug classes)    Go Sex-Intimacy & Heart Disease, Get SMART - Goal Setting: - Group verbal and written instruction through game format to discuss heart disease and the return to sexual intimacy. Provides group verbal and written material to discuss and apply goal setting through the application of the S.M.A.R.T. Method.   Other Matters of the Heart: - Provides group verbal, written materials and models to describe Stable Angina and Peripheral Artery. Includes description of the disease process and treatment options available to the cardiac patient.   Exercise & Equipment Safety: - Individual verbal instruction and demonstration of equipment use and safety with use of the equipment.   Cardiac Rehab from 02/24/2019 in [facility] Cardiac and Pulmonary Rehab  Date  11/09/18  Educator  AS  Instruction Review Code  1- Verbalizes Understanding      Infection Prevention: - Provides verbal and written material to individual with discussion of infection control including proper hand washing and proper equipment cleaning during exercise session.   Cardiac Rehab from 02/24/2019 in [facility] Cardiac and Pulmonary Rehab  Date  11/09/18  Educator  AS  Instruction Review Code  1- Verbalizes Understanding      Falls Prevention: - Provides verbal and written material to individual with discussion of falls prevention and safety.   Cardiac Rehab from 02/24/2019 in [facility] Cardiac and Pulmonary Rehab  Date  11/09/18  Educator  AS  Instruction Review Code  1- Verbalizes Understanding      Diabetes: - Individual verbal and written instruction to review signs/symptoms of diabetes, desired ranges of glucose level fasting, after meals and with exercise. Acknowledge that  pre and post exercise glucose checks will be done for 3 sessions at entry of program.   Know Your Numbers and Risk Factors: -Group verbal and written instruction about important numbers in your health.  Discussion of what are risk factors and how they play a role in the disease process.  Review of Cholesterol, Blood Pressure, Diabetes, and BMI and the role they play in your overall health.   Sleep Hygiene: -Provides group verbal and written instruction about how sleep can affect your health.  Define sleep hygiene, discuss sleep cycles and impact of sleep habits. Review good sleep hygiene tips.    Other: -Provides group and verbal instruction on various topics (see comments)   Knowledge Questionnaire Score: Knowledge Questionnaire Score - 11/11/18 1138      Knowledge Questionnaire Score   Pre Score  21/26  Angina, Exercise.Depression missed       Core Components/Risk Factors/Patient Goals at Admission: Personal Goals and Risk Factors at Admission - 11/09/18 1552      Core Components/Risk Factors/Patient Goals on Admission    Weight Management  Weight Maintenance    Heart Failure  Yes    Hypertension  Yes  Intervention  Provide education on lifestyle modifcations including regular physical activity/exercise, weight management, moderate sodium restriction and increased consumption of fresh fruit, vegetables, and low fat dairy, alcohol moderation, and smoking cessation.;Monitor prescription use compliance.    Expected Outcomes  Short Term: Continued assessment and intervention until BP is < 140/67m HG in hypertensive participants. < 130/854mHG in hypertensive participants with diabetes, heart failure or chronic kidney disease.;Long Term: Maintenance of blood pressure at goal levels.    Intervention  Provide education and support for participant on nutrition & aerobic/resistive exercise along with prescribed medications to achieve LDL <7062mHDL >9m86m  Expected Outcomes  Long Term:  Cholesterol controlled with medications as prescribed, with individualized exercise RX and with personalized nutrition plan. Value goals: LDL < 70mg87mL > 40 mg.    Stress  Yes       Core Components/Risk Factors/Patient Goals Review:  Goals and Risk Factor Review    Row Name 11/30/18 0811 02637/20 0804 01/25/19 0809 02/15/19 0834       Core Components/Risk Factors/Patient Goals Review   Personal Goals Review  Weight Management/Obesity;Develop more efficient breathing techniques such as purse lipped breathing and diaphragmatic breathing and practicing self-pacing with activity.;Hypertension;Lipids  Weight Management/Obesity;Lipids;Hypertension  Weight Management/Obesity;Lipids;Hypertension  Weight Management/Obesity    Review  [place] iCloydeaking meds as directed and measuring BP at home.  BP has been running normal at home and at HT.  [facility] has some knee and hip pain - was taking hydrochloriquine which helped but he cant take it at this time.  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  We discussed doing some ROM exercises to help with this.  He also takes Tylenol.  Taven continues to take meds as directed.He had a cortisone shot for his knee and it helped briefly.  He spoke with his PA about taking glucosamine and is going to try that.  tims knee is doing better - he feels like it is stronger and he can walk up stairs easier.  His hip hurts some - had an xray and didnt hear anything back.  He is taking glucosamine and fish oil.  We reviewed ROM for hip.  He is setting up his total gym.  Maron wants to maintain his weight. Sometimes he is up and down with his dialysis. Sometimesa his blood pressure drop a little lower after dialysis. His doctors are aware of his dizzyness when he stands up. He is staying on track in rehab and has been attending regularly.    Expected Outcomes  Short - continue exercise two days not at HT - add ROM exercises Long - maintain overall exercise and health habits  Short - try glucosamine and  stretches Long - maintain heart healthy habits  Short - add hip ROM exercises Long - continue to improve stamina and reduce knee and hip pain  Short: continue to exercise to maintain weight. Long: maintain weight independently.       Core Components/Risk Factors/Patient Goals at Discharge (Final Review):  Goals and Risk Factor Review - 02/15/19 0834      Core Components/Risk Factors/Patient Goals Review   Personal Goals Review  Weight Management/Obesity    Review  Daivon wants to maintain his weight. Sometimes he is up and down with his dialysis. Sometimesa his blood pressure drop a little lower after dialysis. His doctors are aware of his dizzyness when he stands up. He is staying on track in rehab and has been attending regularly.    Expected Outcomes  Short:  continue to exercise to maintain weight. Long: maintain weight independently.       ITP Comments: ITP Comments    Row Name 11/04/18 1055 11/09/18 1542 11/11/18 0840 11/16/18 0825 11/17/18 0548   ITP Comments  Virtual RN orientation completed. Diagnosis can be found in [facility] 6/8. EP/RD orientation scheduled for 8/4 at 1:30  Completed initial 6 MWT and nutrition consult.  Initial ITP created and sent to Dr Emily Filbert, Medical Director  First full day of exercise!  Patient was oriented to gym and equipment including functions, settings, policies, and procedures.  Patient's individual exercise prescription and treatment plan were reviewed.  All starting workloads were established based on the results of the 6 minute walk test done at initial orientation visit.  The plan for exercise progression was also introduced and progression will be customized based on patient's performance and goals.  [place] stated that after he went home last week his chest was very sore.  Remains sore today. He was advised to decrease or not use weights today.  Also to call his doctor if pain continues.  30 Day Review Completed today. Continue with ITP unless changed by Medical  Director review.   [place] Name 12/09/18 0911 12/15/18 0544 12/23/18 0805 02/09/19 0610 03/09/19 0922   ITP Comments  Philip had lower BP today after dialysis yesterday  30 Day review. Continue with ITP unless directed changes per Medical Director review.  Pt states Dialysis has increased his dry weight by half a kilogram  30 day review completed. Continue with ITP sent to Dr. Emily Filbert, Medical Director of Cardiac and Pulmonary Rehab for review , changes as needed and signature.  30 day review competed . ITP sent to Dr Emily Filbert for review, changes as needed and ITP approval signature.   [place] Name 03/09/19 1344 03/14/19 1535         ITP Comments  Shamell is currenty quarantining after his girlfriend's sister has tested positive. He was negative.  N[place] called to inform us that he has test positive for COVID-19.  He would like to just go ahead and discharge at this time.         Comments: Discharge ITP

## 2019-03-14 NOTE — Progress Notes (Signed)
Discharge Progress Report  Patient Details  Name: Jeremy [name] MRN: [id number] Date of Birth: 04-11-1955 Referring Provider:     Cardiac Rehab from 11/09/2018 in [name]  Referring Provider  Jeremy [name]       Number of Visits: 30  Reason for Discharge:  Patient reached a stable level of exercise. Patient independent in their exercise. Patient has met program and personal goals. Early Exit:  Personal  Smoking History:  Social History   Tobacco Use  Smoking Status Never Smoker  Smokeless Tobacco Never Used    Diagnosis:  S/P CABG x 3  ADL UCSD:   Initial Exercise Prescription: Initial Exercise Prescription - 11/09/18 1500      Date of Initial Exercise RX and Referring Provider   Date  11/09/18    Referring Provider  Jeremy [name]      Treadmill   MPH  2.5    Grade  2    Minutes  15    METs  3.6      NuStep   Level  4    SPM  80    Minutes  15    METs  3.6      REL-XR   Level  3    Speed  50    Minutes  15    METs  3.6      Prescription Details   Frequency (times per week)  2    Duration  Progress to 30 minutes of continuous aerobic without signs/symptoms of physical distress      Intensity   THRR 40-80% of Max Heartrate  115-144    Ratings of Perceived Exertion  11-15    Perceived Dyspnea  0-4      Progression   Progression  Continue to progress workloads to maintain intensity without signs/symptoms of physical distress.      Resistance Training   Training Prescription  Yes    Weight  3 lb    Reps  10-15       Discharge Exercise Prescription (Final Exercise Prescription Changes): Exercise Prescription Changes - 03/09/19 1300      Response to Exercise   Blood Pressure (Admit)  140/66    Blood Pressure (Exercise)  132/70    Blood Pressure (Exit)  110/70    Heart Rate (Admit)  96 bpm    Heart Rate (Exercise)  111 bpm    Heart Rate (Exit)  109 bpm    Rating of Perceived Exertion (Exercise)  14    Symptoms  none    Duration   Continue with 30 min of aerobic exercise without signs/symptoms of physical distress.    Intensity  THRR unchanged      Progression   Progression  Continue to progress workloads to maintain intensity without signs/symptoms of physical distress.    Average METs  2.4      Resistance Training   Training Prescription  Yes    Weight  6 lb    Reps  10-15      Interval Training   Interval Training  Yes    Equipment  Recumbant Elliptical;NuStep    Comments  30 sec on 1 min off      Treadmill   MPH  2.5    Grade  2.5    Minutes  15    METs  3.78      NuStep   Level  6    Minutes  15    METs  3.8  Recumbant Elliptical   Level  2.3    Minutes  15    METs  2      Home Exercise Plan   Plans to continue exercise at  Home (comment)   walking   Frequency  Add 2 additional days to program exercise sessions.    Initial Home Exercises Provided  11/18/18       Functional Capacity: 6 Minute Walk    Row Name 11/09/18 1543         6 Minute Walk   Distance  1288 feet     Walk Time  6 minutes     # of Rest Breaks  0     MPH  2.44     METS  3.6     RPE  11     VO2 Peak  12.6     Symptoms  No     Resting HR  74 bpm     Resting BP  122/60     Max Ex. HR  116 bpm     Max Ex. BP  134/62     2 Minute Post BP  112/62        Psychological, QOL, Others - Outcomes: PHQ 2/9: Depression screen Jeremy [name] 2/9 11/09/2018 03/19/2018  Decreased Interest 0 0  Down, Depressed, Hopeless 0 0  PHQ - 2 Score 0 0  Altered sleeping 0 -  Tired, decreased energy 0 -  Change in appetite 0 -  Feeling bad or failure about yourself  0 -  Trouble concentrating 0 -  Moving slowly or fidgety/restless 0 -  Suicidal thoughts 0 -  PHQ-9 Score 0 -  Difficult doing work/chores Not difficult at all -  Some recent data might be hidden    Quality of Life: Quality of Life - 11/11/18 1138      Quality of Life   Select  Quality of Life      Quality of Life Scores   Health/Function Pre  20.73 %     Socioeconomic Pre  19 %    Psych/Spiritual Pre  20.86 %    Family Pre  20.6 %    GLOBAL Pre  20.38 %       Personal Goals: Goals established at orientation with interventions provided to work toward goal. Personal Goals and Risk Factors at Admission - 11/09/18 1552      Core Components/Risk Factors/Patient Goals on Admission    Weight Management  Weight Maintenance    Heart Failure  Yes    Hypertension  Yes    Intervention  Provide education on lifestyle modifcations including regular physical activity/exercise, weight management, moderate sodium restriction and increased consumption of fresh fruit, vegetables, and low fat dairy, alcohol moderation, and smoking cessation.;Monitor prescription use compliance.    Expected Outcomes  Short Term: Continued assessment and intervention until BP is < 140/58m HG in hypertensive participants. < 130/845mHG in hypertensive participants with diabetes, heart failure or chronic kidney disease.;Long Term: Maintenance of blood pressure at goal levels.    Intervention  Provide education and support for participant on nutrition & aerobic/resistive exercise along with prescribed medications to achieve LDL <7043mHDL >8m68m  Expected Outcomes  Long Term: Cholesterol controlled with medications as prescribed, with individualized exercise RX and with personalized nutrition plan. Value goals: LDL < 70mg27mL > 40 mg.    Stress  Yes        Personal Goals Discharge: Goals and Risk Factor Review  Jeremy [name] Name 11/30/18 9150 12/23/18 0804 01/25/19 0809 02/15/19 0834       Core Components/Risk Factors/Patient Goals Review   Personal Goals Review  Weight Management/Obesity;Develop more efficient breathing techniques such as purse lipped breathing and diaphragmatic breathing and practicing self-pacing with activity.;Hypertension;Lipids  Weight Management/Obesity;Lipids;Hypertension  Weight Management/Obesity;Lipids;Hypertension  Weight Management/Obesity    Review   Jeremy [name] is taking meds as directed and measuring BP at home.  BP has been running normal at home and at Jeremy [name].  He has some knee and hip pain - was taking hydrochloriquine which helped but he cant take it at this time.  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  We discussed doing some ROM exercises to help with this.  He also takes Tylenol.  Quaid continues to take meds as directed.He had a cortisone shot for his knee and it helped briefly.  He spoke with his PA about taking glucosamine and is going to try that.  tims knee is doing better - he feels like it is stronger and he can walk up stairs easier.  His hip hurts some - had an xray and didnt hear anything back.  He is taking glucosamine and fish oil.  We reviewed ROM for hip.  He is setting up his total gym.  Alistair wants to maintain his weight. Sometimes he is up and down with his dialysis. Sometimesa his blood pressure drop a little lower after dialysis. His doctors are aware of his dizzyness when he stands up. He is staying on track in rehab and has been attending regularly.    Expected Outcomes  Short - continue exercise two days not at HT - add ROM exercises Long - maintain overall exercise and health habits  Short - try glucosamine and stretches Long - maintain heart healthy habits  Short - add hip ROM exercises Long - continue to improve stamina and reduce knee and hip pain  Short: continue to exercise to maintain weight. Long: maintain weight independently.       Exercise Goals and Review: Exercise Goals    Row Name 11/09/18 1548             Exercise Goals   Increase Physical Activity  Yes       Expected Outcomes  Short Term: Attend rehab on a regular basis to increase amount of physical activity.;Long Term: Add in home exercise to make exercise part of routine and to increase amount of physical activity.;Long Term: Exercising regularly at least 3-5 days a week.       Increase Strength and Stamina  Yes       Intervention  Provide advice, education,  support and counseling about physical activity/exercise needs.;Develop an individualized exercise prescription for aerobic and resistive training based on initial evaluation findings, risk stratification, comorbidities and participant's personal goals.       Expected Outcomes  Short Term: Increase workloads from initial exercise prescription for resistance, speed, and METs.;Short Term: Perform resistance training exercises routinely during rehab and add in resistance training at home;Long Term: Improve cardiorespiratory fitness, muscular endurance and strength as measured by increased METs and functional capacity (6MWT)       Able to understand and use rate of perceived exertion (RPE) scale  Yes       Intervention  Provide education and explanation on how to use RPE scale       Expected Outcomes  Short Term: Able to use RPE daily in rehab to express subjective intensity level;Long Term:  Able to use RPE to guide  intensity level when exercising independently       Able to understand and use Dyspnea scale  Yes       Intervention  Provide education and explanation on how to use Dyspnea scale       Expected Outcomes  Short Term: Able to use Dyspnea scale daily in rehab to express subjective sense of shortness of breath during exertion;Long Term: Able to use Dyspnea scale to guide intensity level when exercising independently       Knowledge and understanding of Target Heart Rate Range (THRR)  Yes       Intervention  Provide education and explanation of THRR including how the numbers were predicted and where they are located for reference       Expected Outcomes  Short Term: Able to state/look up THRR;Short Term: Able to use daily as guideline for intensity in rehab;Long Term: Able to use THRR to govern intensity when exercising independently       Able to check pulse independently  Yes       Intervention  Provide education and demonstration on how to check pulse in carotid and radial arteries.;Review the  importance of being able to check your own pulse for safety during independent exercise       Expected Outcomes  Short Term: Able to explain why pulse checking is important during independent exercise;Long Term: Able to check pulse independently and accurately       Understanding of Exercise Prescription  Yes       Intervention  Provide education, explanation, and written materials on patient's individual exercise prescription       Expected Outcomes  Short Term: Able to explain program exercise prescription;Long Term: Able to explain home exercise prescription to exercise independently          Exercise Goals Re-Evaluation: Exercise Goals Re-Evaluation    Row Name 11/11/18 0840 11/18/18 0819 11/26/18 0816 11/30/18 1544 12/15/18 1120     Exercise Goal Re-Evaluation   Exercise Goals Review  Increase Physical Activity;Increase Strength and Stamina;Able to understand and use rate of perceived exertion (RPE) scale;Knowledge and understanding of Target Heart Rate Range (THRR);Understanding of Exercise Prescription  Increase Physical Activity;Increase Strength and Stamina;Able to understand and use rate of perceived exertion (RPE) scale;Knowledge and understanding of Target Heart Rate Range (THRR);Understanding of Exercise Prescription;Able to understand and use Dyspnea scale;Able to check pulse independently  Increase Physical Activity;Increase Strength and Stamina;Able to understand and use rate of perceived exertion (RPE) scale;Knowledge and understanding of Target Heart Rate Range (THRR);Understanding of Exercise Prescription  Increase Physical Activity;Increase Strength and Stamina;Able to understand and use rate of perceived exertion (RPE) scale;Able to understand and use Dyspnea scale;Knowledge and understanding of Target Heart Rate Range (THRR);Understanding of Exercise Prescription  Increase Physical Activity;Able to understand and use rate of perceived exertion (RPE) scale;Increase Strength and  Stamina;Knowledge and understanding of Target Heart Rate Range (THRR);Able to check pulse independently;Understanding of Exercise Prescription   Comments  Reviewed RPE scale, THR and program prescription with pt today.  Pt voiced understanding and was given a copy of goals to take home.  Reviewed home exercise with pt today.  Pt plans to walk at home for exercise.  Reviewed THR, pulse, RPE, sign and symptoms, NTG use, and when to call 911 or MD.  Also discussed weather considerations and indoor options.  Pt voiced understanding.  Len is doing well in rehab so far.  He plans to increase levels at next session.  he attends 2 days  per week due to dialysis on other days.  Elia increased levels on both machines today.  he is walking and doing strength work 2 days per week outside class.  Trevontae attends consistently and works at appropriate RPE.  Staff will monitor progress.   Expected Outcomes  Short: Use RPE daily to regulate intensity. Long: Follow program prescription in THR.  Short: Start to add in walking on off days.  Long: Continue to follow program prescription  Short - continue to attend consistently Long :  increase overall MET level  Short - continue home exercise Long - maintain fitness independently  Short - continue to gradually build intensity Long - improve overall MET level   Row Name 01/18/19 1550 01/26/19 1242 02/09/19 1043 02/15/19 0826 02/24/19 1351     Exercise Goal Re-Evaluation   Exercise Goals Review  Increase Physical Activity;Increase Strength and Stamina;Understanding of Exercise Prescription  Increase Physical Activity;Increase Strength and Stamina;Able to understand and use rate of perceived exertion (RPE) scale;Knowledge and understanding of Target Heart Rate Range (THRR);Able to check pulse independently;Understanding of Exercise Prescription  Increase Physical Activity;Increase Strength and Stamina;Understanding of Exercise Prescription  Increase Physical Activity;Increase Strength and  Stamina  Increase Physical Activity;Increase Strength and Stamina;Able to understand and use rate of perceived exertion (RPE) scale;Knowledge and understanding of Target Heart Rate Range (THRR);Understanding of Exercise Prescription   Comments  Damek continues to do well in rehab!  He is still making improvements!  We will continue to monitor his progress.  Giancarlo has increased to 6 lb for strength and works at [employer].  He does exercise on days he doesnt go to dialysis.  Vicki continues to do well in rehab.  He is up to level 2.3 on the recumbenet elliptical and 2.5 mph on the treadmill.  We will continue to monitor his progress.  Nichols has been doing some dumbell work and squats at home. He has been walking on the weekends about 1 mile a day. His hip has been hurting and has since had it checked out but none of his doctors have come up with any diagnosis. Ollivander wants to feel better but thinks dialysis is taken a toll on him. He says that when he gets home from dialysis he is tired and does not do too much.  Dequandre coontinues to do well with exercise.  Staff reviewed the figure 4 stretch for his hip since there is no diganosis of bone issues.  He will try the stretch at home.   Expected Outcomes  Short: Continue to increase workloads.  Long: Continue to improve stamina.  Short - continue to exercise at home Long - increase overall MET level  Short: Continue to boost workloads and talk about intervals.  Long: Continue improve stamina.  Short: continue exercise at home twice a week. Long: maintain a workout routine independently post rehab.  Short: add figure 4 stretch to home exercise Long :  maintain fitness on his own   [place] Name 03/09/19 1347             Exercise Goal Re-Evaluation   Exercise Goals Review  Increase Physical Activity;Increase Strength and Stamina;Understanding of Exercise Prescription       Comments  Lattie has been doing well in rehab.  He will be out for a bit due to a COVID-19 exposure. He is up to  level 6 on the NuStep.  We will continue to monitor his progress.       Expected Outcomes  Short: Continue  to exercise at home.  Long: Continue to improve stamina.          Nutrition & Weight - Outcomes: Pre Biometrics - 11/09/18 1549      Pre Biometrics   Height  5' 9.75" (1.772 m)    Weight  188 lb 8 oz (85.5 kg)    BMI (Calculated)  27.23    Single Leg Stand  7.06 seconds        Nutrition: Nutrition Therapy & Goals - 12/07/18 0812      Personal Nutrition Goals   Comments  Pt reported in the hospital (renal and HH diet) his fluid retention dropped and his stomach was not as "bloated". Pt has renal diettian follwoing him; went over CKD on dialysis basics and provided handouts. Went over gout basics and provided handout (pt reports organ meats, steak and pork chops are his most noticible triggers and is on medication). Pt is on coumadin and continues to eat a consistent amount of leafy greens (working with doctor). Discussed Hh eating in the context of his other issues, discussed how it will be a balancing act and we may have to fine tune things. Pt reports protein is WNL since including eggs, he takes his renal vitamin after dialysis, and he is on a phosphate binder. Pt reports eating cheerios or oatmeal on non-dialysis days and nothing on dialysis days for B. L both days is eggs with [place] bacon. S grits and peanut butter. D chicken or fish and vegtables. Pt reports sometimes eating fried food but cut out fried foods and red meat for the most part. Discussed low Na eating. Pt is also mindful of the amount of liquids he consumes (usually water, tea, or juice).       Nutrition Discharge: Nutrition Assessments - 11/11/18 1138      MEDFICTS Scores   Pre Score  43       Education Questionnaire Score: Knowledge Questionnaire Score - 11/11/18 1138      Knowledge Questionnaire Score   Pre Score  21/26  Angina, Exercise.Depression missed       Goals reviewed with patient; copy  given to patient.

## 2019-03-14 NOTE — Telephone Encounter (Signed)
Copied from [place] [phone]. Topic: General - Other >> Mar 14, 2019 11:41 AM Jodie Echevaria wrote: Reason for CRM: Patient called to inform Dr Caryl Bis that he is positive for covid. Say that he have a cough and tiredness but nothing else. Would like a call back to let him know if there is something he need to be doing. Please call Ph# [phone]

## 2019-03-14 NOTE — Telephone Encounter (Signed)
Jeremy [name] called to inform us that he has test positive for COVID-19.  He would like to just go ahead and discharge at this time.

## 2019-03-14 NOTE — Telephone Encounter (Signed)
See other phone note

## 2019-03-15 ENCOUNTER — Other Ambulatory Visit: Payer: Self-pay

## 2019-03-15 ENCOUNTER — Inpatient Hospital Stay
Admission: EM | Admit: 2019-03-15 | Discharge: 2019-03-19 | DRG: 177 | Disposition: A | Discharge status: Home / Self Care | Payer: Medicare Other | Attending: Family Medicine | Admitting: Family Medicine

## 2019-03-15 ENCOUNTER — Emergency Department: Payer: Medicare Other

## 2019-03-15 ENCOUNTER — Ambulatory Visit: Payer: Medicare Other | Admitting: Urology

## 2019-03-15 ENCOUNTER — Encounter: Payer: Self-pay | Admitting: Emergency Medicine

## 2019-03-15 DIAGNOSIS — Z952 Presence of prosthetic heart valve: Secondary | ICD-10-CM | POA: Diagnosis not present

## 2019-03-15 DIAGNOSIS — Z7982 Long term (current) use of aspirin: Secondary | ICD-10-CM

## 2019-03-15 DIAGNOSIS — M109 Gout, unspecified: Secondary | ICD-10-CM | POA: Diagnosis present

## 2019-03-15 DIAGNOSIS — G4733 Obstructive sleep apnea (adult) (pediatric): Secondary | ICD-10-CM | POA: Diagnosis present

## 2019-03-15 DIAGNOSIS — E875 Hyperkalemia: Secondary | ICD-10-CM | POA: Diagnosis present

## 2019-03-15 DIAGNOSIS — Z809 Family history of malignant neoplasm, unspecified: Secondary | ICD-10-CM

## 2019-03-15 DIAGNOSIS — I2510 Atherosclerotic heart disease of native coronary artery without angina pectoris: Secondary | ICD-10-CM | POA: Diagnosis present

## 2019-03-15 DIAGNOSIS — T8612 Kidney transplant failure: Secondary | ICD-10-CM | POA: Diagnosis present

## 2019-03-15 DIAGNOSIS — I10 Essential (primary) hypertension: Secondary | ICD-10-CM | POA: Diagnosis present

## 2019-03-15 DIAGNOSIS — I132 Hypertensive heart and chronic kidney disease with heart failure and with stage 5 chronic kidney disease, or end stage renal disease: Secondary | ICD-10-CM | POA: Diagnosis present

## 2019-03-15 DIAGNOSIS — Z992 Dependence on renal dialysis: Secondary | ICD-10-CM | POA: Diagnosis not present

## 2019-03-15 DIAGNOSIS — N186 End stage renal disease: Secondary | ICD-10-CM | POA: Diagnosis not present

## 2019-03-15 DIAGNOSIS — J9601 Acute respiratory failure with hypoxia: Secondary | ICD-10-CM | POA: Diagnosis present

## 2019-03-15 DIAGNOSIS — D638 Anemia in other chronic diseases classified elsewhere: Secondary | ICD-10-CM | POA: Diagnosis present

## 2019-03-15 DIAGNOSIS — Z85528 Personal history of other malignant neoplasm of kidney: Secondary | ICD-10-CM

## 2019-03-15 DIAGNOSIS — D631 Anemia in chronic kidney disease: Secondary | ICD-10-CM | POA: Diagnosis present

## 2019-03-15 DIAGNOSIS — I5022 Chronic systolic (congestive) heart failure: Secondary | ICD-10-CM | POA: Diagnosis present

## 2019-03-15 DIAGNOSIS — J9600 Acute respiratory failure, unspecified whether with hypoxia or hypercapnia: Secondary | ICD-10-CM | POA: Insufficient documentation

## 2019-03-15 DIAGNOSIS — I452 Bifascicular block: Secondary | ICD-10-CM | POA: Diagnosis present

## 2019-03-15 DIAGNOSIS — J069 Acute upper respiratory infection, unspecified: Secondary | ICD-10-CM

## 2019-03-15 DIAGNOSIS — R0602 Shortness of breath: Secondary | ICD-10-CM | POA: Diagnosis present

## 2019-03-15 DIAGNOSIS — I25118 Atherosclerotic heart disease of native coronary artery with other forms of angina pectoris: Secondary | ICD-10-CM | POA: Diagnosis not present

## 2019-03-15 DIAGNOSIS — K219 Gastro-esophageal reflux disease without esophagitis: Secondary | ICD-10-CM | POA: Diagnosis present

## 2019-03-15 DIAGNOSIS — N2581 Secondary hyperparathyroidism of renal origin: Secondary | ICD-10-CM | POA: Diagnosis not present

## 2019-03-15 DIAGNOSIS — E876 Hypokalemia: Secondary | ICD-10-CM | POA: Diagnosis present

## 2019-03-15 DIAGNOSIS — M1990 Unspecified osteoarthritis, unspecified site: Secondary | ICD-10-CM | POA: Diagnosis present

## 2019-03-15 DIAGNOSIS — J1289 Other viral pneumonia: Secondary | ICD-10-CM | POA: Diagnosis present

## 2019-03-15 DIAGNOSIS — Z905 Acquired absence of kidney: Secondary | ICD-10-CM

## 2019-03-15 DIAGNOSIS — Z833 Family history of diabetes mellitus: Secondary | ICD-10-CM

## 2019-03-15 DIAGNOSIS — Z79899 Other long term (current) drug therapy: Secondary | ICD-10-CM

## 2019-03-15 DIAGNOSIS — Z951 Presence of aortocoronary bypass graft: Secondary | ICD-10-CM

## 2019-03-15 DIAGNOSIS — Z8249 Family history of ischemic heart disease and other diseases of the circulatory system: Secondary | ICD-10-CM | POA: Diagnosis not present

## 2019-03-15 DIAGNOSIS — I255 Ischemic cardiomyopathy: Secondary | ICD-10-CM | POA: Diagnosis present

## 2019-03-15 DIAGNOSIS — I471 Supraventricular tachycardia: Secondary | ICD-10-CM | POA: Diagnosis present

## 2019-03-15 DIAGNOSIS — I4891 Unspecified atrial fibrillation: Secondary | ICD-10-CM | POA: Diagnosis not present

## 2019-03-15 DIAGNOSIS — U071 COVID-19: Secondary | ICD-10-CM | POA: Diagnosis not present

## 2019-03-15 DIAGNOSIS — Z7901 Long term (current) use of anticoagulants: Secondary | ICD-10-CM

## 2019-03-15 DIAGNOSIS — I4892 Unspecified atrial flutter: Secondary | ICD-10-CM | POA: Diagnosis present

## 2019-03-15 DIAGNOSIS — I4821 Permanent atrial fibrillation: Secondary | ICD-10-CM | POA: Diagnosis present

## 2019-03-15 DIAGNOSIS — E785 Hyperlipidemia, unspecified: Secondary | ICD-10-CM | POA: Diagnosis present

## 2019-03-15 DIAGNOSIS — N189 Chronic kidney disease, unspecified: Secondary | ICD-10-CM | POA: Diagnosis present

## 2019-03-15 DIAGNOSIS — Z7952 Long term (current) use of systemic steroids: Secondary | ICD-10-CM

## 2019-03-15 DIAGNOSIS — J189 Pneumonia, unspecified organism: Secondary | ICD-10-CM | POA: Diagnosis not present

## 2019-03-15 HISTORY — DX: COVID-19: U07.1

## 2019-03-15 LAB — CBC WITH DIFFERENTIAL/PLATELET
Abs Immature Granulocytes: 0.03 10*3/uL (ref 0.00–0.07)
Basophils Absolute: 0 10*3/uL (ref 0.0–0.1)
Basophils Relative: 0 %
Eosinophils Absolute: 0 10*3/uL (ref 0.0–0.5)
Eosinophils Relative: 0 %
HCT: 35.6 % — ABNORMAL LOW (ref 39.0–52.0)
Hemoglobin: 11.2 g/dL — ABNORMAL LOW (ref 13.0–17.0)
Immature Granulocytes: 0 %
Lymphocytes Relative: 21 %
Lymphs Abs: 1.5 10*3/uL (ref 0.7–4.0)
MCH: 33.7 pg (ref 26.0–34.0)
MCHC: 31.5 g/dL (ref 30.0–36.0)
MCV: 107.2 fL — ABNORMAL HIGH (ref 80.0–100.0)
Monocytes Absolute: 0.4 10*3/uL (ref 0.1–1.0)
Monocytes Relative: 5 %
Neutro Abs: 5.4 10*3/uL (ref 1.7–7.7)
Neutrophils Relative %: 74 %
Platelets: 124 10*3/uL — ABNORMAL LOW (ref 150–400)
RBC: 3.32 MIL/uL — ABNORMAL LOW (ref 4.22–5.81)
RDW: 14.6 % (ref 11.5–15.5)
WBC: 7.3 10*3/uL (ref 4.0–10.5)
nRBC: 0 % (ref 0.0–0.2)

## 2019-03-15 LAB — FIBRIN DERIVATIVES D-DIMER (ARMC ONLY): Fibrin derivatives D-dimer (ARMC): 1091.47 ng/mL (FEU) — ABNORMAL HIGH (ref 0.00–499.00)

## 2019-03-15 LAB — COMPREHENSIVE METABOLIC PANEL
ALT: 21 U/L (ref 0–44)
AST: 29 U/L (ref 15–41)
Albumin: 3.5 g/dL (ref 3.5–5.0)
Alkaline Phosphatase: 167 U/L — ABNORMAL HIGH (ref 38–126)
Anion gap: 23 — ABNORMAL HIGH (ref 5–15)
BUN: 76 mg/dL — ABNORMAL HIGH (ref 8–23)
CO2: 21 mmol/L — ABNORMAL LOW (ref 22–32)
Calcium: 8.4 mg/dL — ABNORMAL LOW (ref 8.9–10.3)
Chloride: 95 mmol/L — ABNORMAL LOW (ref 98–111)
Creatinine, Ser: 14.36 mg/dL — ABNORMAL HIGH (ref 0.61–1.24)
GFR calc Af Amer: 4 mL/min — ABNORMAL LOW (ref >60)
GFR calc non Af Amer: 3 mL/min — ABNORMAL LOW (ref >60)
Glucose, Bld: 106 mg/dL — ABNORMAL HIGH (ref 70–99)
Potassium: 6 mmol/L — ABNORMAL HIGH (ref 3.5–5.1)
Sodium: 139 mmol/L (ref 135–145)
Total Bilirubin: 1.4 mg/dL — ABNORMAL HIGH (ref 0.3–1.2)
Total Protein: 8 g/dL (ref 6.5–8.1)

## 2019-03-15 LAB — C-REACTIVE PROTEIN: CRP: 20.8 mg/dL — ABNORMAL HIGH (ref <1.0)

## 2019-03-15 LAB — TROPONIN I (HIGH SENSITIVITY)
Troponin I (High Sensitivity): 380 ng/L (ref <18)
Troponin I (High Sensitivity): 503 ng/L (ref <18)

## 2019-03-15 LAB — TRIGLYCERIDES: Triglycerides: 179 mg/dL — ABNORMAL HIGH (ref <150)

## 2019-03-15 LAB — HEPATITIS B SURFACE ANTIGEN: Hepatitis B Surface Ag: NONREACTIVE

## 2019-03-15 LAB — TYPE AND SCREEN
ABO/RH(D): O POS
Antibody Screen: NEGATIVE

## 2019-03-15 LAB — FERRITIN: Ferritin: 1828 ng/mL — ABNORMAL HIGH (ref 24–336)

## 2019-03-15 LAB — FIBRINOGEN: Fibrinogen: 692 mg/dL — ABNORMAL HIGH (ref 210–475)

## 2019-03-15 LAB — POC SARS CORONAVIRUS 2 AG: SARS Coronavirus 2 Ag: POSITIVE — AB

## 2019-03-15 LAB — LACTIC ACID, PLASMA
Lactic Acid, Venous: 1.8 mmol/L (ref 0.5–1.9)
Lactic Acid, Venous: 1.1 mmol/L (ref 0.5–1.9)

## 2019-03-15 LAB — ABO/RH: ABO/RH(D): O POS

## 2019-03-15 LAB — GLUCOSE, CAPILLARY: Glucose-Capillary: 86 mg/dL (ref 70–99)

## 2019-03-15 LAB — PROTIME-INR
INR: 3.9 — ABNORMAL HIGH (ref 0.8–1.2)
Prothrombin Time: 38.4 seconds — ABNORMAL HIGH (ref 11.4–15.2)

## 2019-03-15 LAB — PROCALCITONIN: Procalcitonin: 0.8 ng/mL

## 2019-03-15 LAB — LACTATE DEHYDROGENASE: LDH: 259 U/L — ABNORMAL HIGH (ref 98–192)

## 2019-03-15 LAB — BRAIN NATRIURETIC PEPTIDE: B Natriuretic Peptide: 1821 pg/mL — ABNORMAL HIGH (ref 0.0–100.0)

## 2019-03-15 MED ORDER — ACETAMINOPHEN 500 MG PO TABS
1000.0000 mg | ORAL_TABLET | Freq: Once | ORAL | Status: AC
  Administered 2019-03-15: 1000 mg via ORAL

## 2019-03-15 MED ORDER — ZINC SULFATE 220 (50 ZN) MG PO CAPS
220.0000 mg | ORAL_CAPSULE | Freq: Every day | ORAL | Status: DC
  Administered 2019-03-15 – 2019-03-19 (×5): 220 mg via ORAL
  Filled 2019-03-15 (×6): qty 1

## 2019-03-15 MED ORDER — SODIUM CHLORIDE 0.9 % IV SOLN
100.0000 mg | Freq: Every day | INTRAVENOUS | Status: AC
  Administered 2019-03-16 – 2019-03-19 (×4): 100 mg via INTRAVENOUS
  Filled 2019-03-15: qty 100
  Filled 2019-03-15: qty 20
  Filled 2019-03-15 (×2): qty 100

## 2019-03-15 MED ORDER — ASPIRIN EC 81 MG PO TBEC
81.0000 mg | DELAYED_RELEASE_TABLET | Freq: Every day | ORAL | Status: DC
  Administered 2019-03-15 – 2019-03-19 (×5): 81 mg via ORAL
  Filled 2019-03-15 (×5): qty 1

## 2019-03-15 MED ORDER — INSULIN ASPART 100 UNIT/ML IV SOLN
5.0000 [IU] | Freq: Once | INTRAVENOUS | Status: AC
  Administered 2019-03-15: 5 [IU] via INTRAVENOUS
  Filled 2019-03-15: qty 0.05

## 2019-03-15 MED ORDER — PREDNISONE 5 MG PO TABS: 5.0000 mg | ORAL_TABLET | Freq: Every day | ORAL | Status: DC

## 2019-03-15 MED ORDER — CALCIUM GLUCONATE-NACL 1-0.675 GM/50ML-% IV SOLN
1.0000 g | Freq: Once | INTRAVENOUS | Status: AC
  Administered 2019-03-15: 1000 mg via INTRAVENOUS
  Filled 2019-03-15: qty 50

## 2019-03-15 MED ORDER — IPRATROPIUM BROMIDE HFA 17 MCG/ACT IN AERS
2.0000 | INHALATION_SPRAY | RESPIRATORY_TRACT | Status: DC
  Administered 2019-03-15 – 2019-03-19 (×24): 2 via RESPIRATORY_TRACT
  Filled 2019-03-15: qty 12.9

## 2019-03-15 MED ORDER — WARFARIN - PHARMACIST DOSING INPATIENT
Freq: Every day | Status: DC
  Administered 2019-03-15 – 2019-03-17 (×3)
  Filled 2019-03-15: qty 1

## 2019-03-15 MED ORDER — CINACALCET HCL 30 MG PO TABS
30.0000 mg | ORAL_TABLET | ORAL | Status: DC
  Administered 2019-03-15 – 2019-03-19 (×3): 30 mg via ORAL
  Filled 2019-03-15 (×5): qty 1

## 2019-03-15 MED ORDER — HYDRALAZINE HCL 25 MG PO TABS
25.0000 mg | ORAL_TABLET | Freq: Three times a day (TID) | ORAL | Status: DC | PRN
  Filled 2019-03-15: qty 1

## 2019-03-15 MED ORDER — LIDOCAINE-PRILOCAINE 2.5-2.5 % EX CREA
1.0000 "application " | TOPICAL_CREAM | CUTANEOUS | Status: DC | PRN
  Filled 2019-03-15: qty 5

## 2019-03-15 MED ORDER — ALBUTEROL SULFATE HFA 108 (90 BASE) MCG/ACT IN AERS
2.0000 | INHALATION_SPRAY | RESPIRATORY_TRACT | Status: DC | PRN
  Administered 2019-03-15: 2 via RESPIRATORY_TRACT
  Filled 2019-03-15 (×2): qty 6.7

## 2019-03-15 MED ORDER — PANTOPRAZOLE SODIUM 40 MG PO TBEC
40.0000 mg | DELAYED_RELEASE_TABLET | Freq: Every day | ORAL | Status: DC
  Administered 2019-03-15 – 2019-03-19 (×5): 40 mg via ORAL
  Filled 2019-03-15 (×5): qty 1

## 2019-03-15 MED ORDER — GUAIFENESIN ER 600 MG PO TB12
600.0000 mg | ORAL_TABLET | Freq: Two times a day (BID) | ORAL | Status: DC
  Administered 2019-03-15 – 2019-03-19 (×9): 600 mg via ORAL
  Filled 2019-03-15 (×10): qty 1

## 2019-03-15 MED ORDER — METHYLPREDNISOLONE SODIUM SUCC 40 MG IJ SOLR
40.0000 mg | Freq: Two times a day (BID) | INTRAMUSCULAR | Status: DC
  Administered 2019-03-15 – 2019-03-19 (×8): 40 mg via INTRAVENOUS
  Filled 2019-03-15 (×8): qty 1

## 2019-03-15 MED ORDER — DM-GUAIFENESIN ER 30-600 MG PO TB12: 1.0000 | ORAL_TABLET | Freq: Two times a day (BID) | ORAL | Status: DC

## 2019-03-15 MED ORDER — CHLORHEXIDINE GLUCONATE CLOTH 2 % EX PADS: 6.0000 | MEDICATED_PAD | Freq: Every day | CUTANEOUS | Status: DC

## 2019-03-15 MED ORDER — ACETAMINOPHEN 325 MG PO TABS: 650.0000 mg | ORAL_TABLET | Freq: Four times a day (QID) | ORAL | Status: DC | PRN

## 2019-03-15 MED ORDER — AMIODARONE HCL 200 MG PO TABS
200.0000 mg | ORAL_TABLET | Freq: Every day | ORAL | Status: DC
  Administered 2019-03-15 – 2019-03-19 (×5): 200 mg via ORAL
  Filled 2019-03-15 (×5): qty 1

## 2019-03-15 MED ORDER — METOPROLOL TARTRATE 25 MG PO TABS
12.5000 mg | ORAL_TABLET | Freq: Two times a day (BID) | ORAL | Status: DC
  Administered 2019-03-15 – 2019-03-19 (×8): 12.5 mg via ORAL
  Filled 2019-03-15 (×8): qty 1

## 2019-03-15 MED ORDER — ALLOPURINOL 100 MG PO TABS
300.0000 mg | ORAL_TABLET | Freq: Every day | ORAL | Status: DC
  Administered 2019-03-15 – 2019-03-19 (×5): 300 mg via ORAL
  Filled 2019-03-15 (×5): qty 3

## 2019-03-15 MED ORDER — SENNOSIDES-DOCUSATE SODIUM 8.6-50 MG PO TABS
1.0000 | ORAL_TABLET | Freq: Every evening | ORAL | Status: DC | PRN
  Administered 2019-03-15: 1 via ORAL
  Filled 2019-03-15: qty 1

## 2019-03-15 MED ORDER — RENA-VITE PO TABS: 1.0000 | ORAL_TABLET | Freq: Every day | ORAL | Status: DC

## 2019-03-15 MED ORDER — SODIUM CHLORIDE 0.9 % IV SOLN
Freq: Once | INTRAVENOUS | Status: AC
  Administered 2019-03-15: 15:00:00 via INTRAVENOUS

## 2019-03-15 MED ORDER — DEXTROMETHORPHAN POLISTIREX ER 30 MG/5ML PO SUER
30.0000 mg | Freq: Two times a day (BID) | ORAL | Status: DC
  Administered 2019-03-15 – 2019-03-19 (×8): 30 mg via ORAL
  Filled 2019-03-15 (×15): qty 5

## 2019-03-15 MED ORDER — DEXAMETHASONE SODIUM PHOSPHATE 10 MG/ML IJ SOLN
8.0000 mg | Freq: Once | INTRAMUSCULAR | Status: AC
  Administered 2019-03-15: 8 mg via INTRAVENOUS

## 2019-03-15 MED ORDER — PRAVASTATIN SODIUM 20 MG PO TABS
20.0000 mg | ORAL_TABLET | Freq: Every evening | ORAL | Status: DC
  Administered 2019-03-15 – 2019-03-18 (×4): 20 mg via ORAL
  Filled 2019-03-15 (×5): qty 1

## 2019-03-15 MED ORDER — RENA-VITE PO TABS
1.0000 | ORAL_TABLET | Freq: Every day | ORAL | Status: DC
  Administered 2019-03-15 – 2019-03-19 (×5): 1 via ORAL
  Filled 2019-03-15 (×5): qty 1

## 2019-03-15 MED ORDER — DEXTROSE 50 % IV SOLN
50.0000 mL | Freq: Once | INTRAVENOUS | Status: AC
  Administered 2019-03-15: 50 mL via INTRAVENOUS
  Filled 2019-03-15: qty 50

## 2019-03-15 MED ORDER — SODIUM CHLORIDE 0.9 % IV SOLN
200.0000 mg | Freq: Once | INTRAVENOUS | Status: AC
  Administered 2019-03-15: 200 mg via INTRAVENOUS
  Filled 2019-03-15: qty 200

## 2019-03-15 MED ORDER — VITAMIN C 500 MG PO TABS
500.0000 mg | ORAL_TABLET | Freq: Every day | ORAL | Status: DC
  Administered 2019-03-15 – 2019-03-19 (×5): 500 mg via ORAL
  Filled 2019-03-15 (×6): qty 1

## 2019-03-15 NOTE — H&P (Signed)
History and Physical    Jeremy [name] LPF:[id number] DOB: Nov 02, 1955 DOA: 03/15/2019  Referring MD/NP/PA:   PCP: Leone Haven, MD   Patient coming from:  The patient is coming from home.  At baseline, pt is independent for most of ADL.        Chief Complaint: Shortness of breath, fever, cough,  HPI: Jeremy [name] is a 63 y.o. male with medical history significant of ESRD-HD (MWF), hypertension, hyperlipidemia, GERD, gout, mechanic aortic valve replacement on Coumadin, CAD, CABG, GI bleeding, failed renal transplantation, renal cell carcinoma, PAF on Coumadin, sCHF with EF 45-50%, ESRD-HD, anemia, arthritis on chronic prednisone, GERD, gout, who presents with shortness of breath, fever and cough.  Patient states that he has been having cough, shortness of breath, fever and chills in the past several days. He was tested positive for COVID-19 on last Monday.  His shortness breath has been progressively worsening.  No chest pain.  His oxygen saturations 85% on room air in ED.  Patient has some nausea, dry heaves, but no abdominal pain or diarrhea.  Denies symptoms of UTI.  No unilateral weakness.  His last dialysis was Friday.   ED Course: pt was found to have WBC 7.3, lactic acid 1.9, potassium 6.0, bicarbonate of 21, creatinine 14.3, BUN 76, Covid Ag positive, temperature 103, elevated blood pressure, tachycardia, A. fib with RVR, oxygen saturation 95% on room air.  Chest x-ray showed bilateral extensive infiltration, cardiomegaly and vascular congestion.  Patient is admitted to [facility] bed as inpatient. Renal was consulted for HD.  Review of Systems:   General: has fevers, chills, no body weight gain, has poor appetite, has fatigue HEENT: no blurry vision, hearing changes or sore throat Respiratory: has dyspnea, coughing, wheezing CV: no chest pain, no palpitations GI: has nausea, dry heaves, no abdominal pain, diarrhea, constipation GU: no dysuria, burning on urination, increased urinary  frequency, hematuria  Ext: no leg edema Neuro: no unilateral weakness, numbness, or tingling, no vision change or hearing loss Skin: no rash, no skin tear. MSK: No muscle spasm, no deformity, no limitation of range of movement in spin Heme: No easy bruising.  Travel history: No recent long distant travel.  Allergy:  Allergies  Allergen Reactions  . Statins     Other reaction(s): Arthralgias (intolerance), Myalgias (intolerance)  Pt taking pravastatin   . Sertraline Rash    Past Medical History:  Diagnosis Date  . 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 Aortic stenosis    a. 2014 s/p mech AVR ([facility])-->chronic coumadin; b. 08/2018 Echo: Mild AS/AI. Nl fxn/gradient.  . Arthritis   . Atrial fibrillation ([place])   . CAD (coronary artery disease)    a. 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 02/2018 MV [facility]): large inf, infsept, inflat infarct w/ min rev, EF 30%; c. 06/2018 Cath: LM min irregs, LAD 60p, 95/30/35m, LCX min irregs, RCA 40p, 63m, 90d, RPAV 90, RPDA 95ost; c. 09/2018 CABGx3 (LIMA->LAD, VG->D1, VG->PDA).  . Chronic anticoagulation 06/2012   a. Coumadin in the setting of mech AVR and afib.  Marland Kitchen CPAP (continuous positive airway pressure) dependence   . Dialysis patient [facility]) 04/07/2006   Patient started HD around 1996,  had 1st transplant LLQ around 1998 until 2007 when they found renal cell cancer in transplant and both native kidneys, all were removed > went back on HD until 2nd transplant RLQ in 2014 which lasted 3  yrs; it was embolized for suspected acute rejection. Is currently on HD MWF in [place] w/ [facility] nephrology.  . ESRD (end stage renal disease) ([place])    a. 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 1999 s/p Kidney transplant; c. 2014 Bilat nephrectomy (transplanted kidney resected) in the setting of renal cell carcinoma; d. PD until 11/2017-->HD.  Marland Kitchen Heart murmur   . HFrEF (heart failure with reduced  ejection fraction) ([place])    a. 07/2016 Echo: EF 55-60%; b. 02/2018 MV: EF 30%; c. 04/2018 Echo: EF 35%; d. 08/2018 Echo: EF 30-35%. Glob HK w/o rwma. Mildly reduced RV fxn. Mod to sev dil LA. Nl AoV fxn.  Marland Kitchen Hx of colonoscopy    2009 by Dr. Laural Golden. Normal except for small submucosal lipoma. No evidence of polyps or colitis.  . Hyperlipidemia   . Hypertension   . Hypogonadism in male 07/25/2015  . Ischemic cardiomyopathy    a. 07/2016 Echo: EF 55-60%; b. 02/2018 MV: EF 30%; c. 04/2018 Echo: EF 35%; d. 08/2018 Echo: EF 30-35%.  . OSA (obstructive sleep apnea)    No CPAP  . Permanent atrial fibrillation (HCC)    a. CHA2DS2VASc = 3-->Coumadin (also mech AVR); b. 06/2018 Amio started 2/2 elevated rates.  . Renal cell cancer ([place])   . Renal cell carcinoma ([place]) 08/04/2016    Past Surgical History:  Procedure Laterality Date  . AORTIC VALVE REPLACEMENT  3 /11 /2014   At [facility]    . CAPD INSERTION N/A 02/19/2017   Procedure: LAPAROSCOPIC INSERTION CONTINUOUS AMBULATORY PERITONEAL DIALYSIS  (CAPD) CATHETER;  Surgeon: Algernon Huxley, MD;  Location: [facility];  Service: General;  Laterality: N/A;  . CORONARY ANGIOPLASTY WITH STENT PLACEMENT    . CORONARY ARTERY BYPASS GRAFT N/A 09/16/2018   Procedure: CORONARY ARTERY BYPASS GRAFT times three      with left internal mammary artery and endoharvest of right leg greater saphenous vein;  Surgeon: Melrose Nakayama, MD;  Location: [facility];  Service: Open Heart Surgery;  Laterality: N/A;  . DG AV DIALYSIS  SHUNT ACCESS EXIST*L* OR     left arm  . EPIGASTRIC HERNIA REPAIR N/A 02/19/2017   Procedure: HERNIA REPAIR EPIGASTRIC ADULT;  Surgeon: Robert Bellow, MD;  Location: [facility];  Service: General;  Laterality: N/A;  . EYE SURGERY     bilateral cataract  . HERNIA REPAIR     UMBILICAL HERNIA  . HERNIA REPAIR  02/19/2017  . INNER EAR SURGERY     20 years ago  . KIDNEY TRANSPLANT    . Peritoneal Dialysis  access placement  02/19/2017  . REMOVAL OF A DIALYSIS CATHETER N/A 11/23/2017   Procedure: REMOVAL OF A PERITONEAL DIALYSIS CATHETER;  Surgeon: Algernon Huxley, MD;  Location: [facility];  Service: Vascular;  Laterality: N/A;  . RIGHT/LEFT HEART CATH AND CORONARY ANGIOGRAPHY Bilateral 06/07/2018   Procedure: RIGHT/LEFT HEART CATH AND CORONARY ANGIOGRAPHY;  Surgeon: Wellington Hampshire, MD;  Location: [facility];  Service: Cardiovascular;  Laterality: Bilateral;  . TEE WITHOUT CARDIOVERSION N/A 07/21/2016   Procedure: TRANSESOPHAGEAL ECHOCARDIOGRAM (TEE);  Surgeon: Wellington Hampshire, MD;  Location: [facility];  Service: Cardiovascular;  Laterality: N/A;  . TEE WITHOUT CARDIOVERSION N/A 09/16/2018   Procedure: TRANSESOPHAGEAL ECHOCARDIOGRAM (TEE);  Surgeon: Melrose Nakayama, MD;  Location: [facility];  Service: Open Heart Surgery;  Laterality: N/A;    Social History:  reports that he has never  smoked. He has never used smokeless tobacco. He reports that he does not drink alcohol or use drugs.  Family History:  Family History  Problem Relation Age of Onset  . Cancer Mother        pancreatic  . Heart disease Father   . Diabetes Father      Prior to Admission medications   Medication Sig Start Date End Date Taking? Authorizing Provider  acetaminophen (TYLENOL) 500 MG tablet Take 1,000 mg 2 (two) times daily as needed by mouth for moderate pain.    [provider]  allopurinol (ZYLOPRIM) 300 MG tablet Take 300 mg by mouth daily. 12/23/17   [provider]  amiodarone (PACERONE) 200 MG tablet Take 1 tablet (200 mg total) by mouth daily. 10/07/18   Theora Gianotti, NP  aspirin EC 81 MG EC tablet Take 1 tablet (81 mg total) by mouth daily. 09/23/18   Gold, Wayne E, PA-C  AURYXIA 1 GM 210 MG(Fe) tablet TAKE 2 TABLETS BY MOUTH THREE TIMES A DAY WITH MEALS 10/13/18   [provider]  cinacalcet (SENSIPAR) 30 MG tablet Take 30 mg by mouth every other day.     [provider]  Darbepoetin Alfa (ARANESP) 100 MCG/0.5ML SOSY injection Inject 0.5 mLs (100 mcg total) into the vein every Monday with hemodialysis. 09/27/18   John Giovanni, PA-C  Dextrose-Fructose-Sod Citrate (NAUZENE PO) Take 1-2 tablets 2 (two) times daily as needed by mouth (nausea).    [provider]  folic acid-vitamin b complex-vitamin c-selenium-zinc (DIALYVITE) 3 MG TABS tablet Take 1 tablet by mouth daily.    [provider]  lidocaine-prilocaine (EMLA) cream Apply 1 application topically as needed (for fistula).    [provider]  metoprolol tartrate (LOPRESSOR) 25 MG tablet TAKE 1/2 TABLETS (12.5 MG TOTAL) BY MOUTH 2 (TWO) TIMES DAILY. 01/25/19   Melrose Nakayama, MD  midodrine (PROAMATINE) 10 MG tablet Take 10 mg by mouth 2 (two) times daily as needed (for low BP).    [provider]  multivitamin (RENA-VIT) TABS tablet Take 1 tablet by mouth daily. 02/23/19   [provider]  omeprazole (PRILOSEC) 20 MG capsule Take 20 mg by mouth daily.    [provider]  pravastatin (PRAVACHOL) 20 MG tablet Take 1 tablet (20 mg total) by mouth every evening. 12/08/18 03/08/19  Wellington Hampshire, MD  predniSONE (DELTASONE) 5 MG tablet Take 5 mg by mouth daily with breakfast.  10/12/16   [provider]  sevelamer carbonate (RENVELA) 800 MG tablet Take 40,000 mg by mouth 3 (three) times daily with meals. 5 tabs per meal    [provider]  warfarin (COUMADIN) 1 MG tablet Take 1 tablet (1 mg total) by mouth daily at 6 PM. Patient taking differently: Take 1-2 mg by mouth See admin instructions. 1 mg all days , except 0.5 MG on Fridays..* OR AS DIRECTED 08/05/18   Wellington Hampshire, MD    Physical Exam: Vitals:   03/15/19 2045 03/15/19 2100 03/15/19 2104 03/15/19 2247  BP: 122/67 124/69 (!) 153/98 (!) 141/90  Pulse: 87 (!) 111 (!) 108 69  Resp: 18 18 18 20   Temp:  98 F (36.7 C)  97.8 F (36.6 C)  TempSrc:  Oral  Oral   SpO2:    100%  Weight:      Height:       General: Not in acute distress HEENT:       Eyes: PERRL, EOMI, no  scleral icterus.       ENT: No discharge from the ears and nose, no pharynx injection, no tonsillar enlargement.        Neck: No JVD, no bruit, no mass felt. Heme: No neck lymph node enlargement. Cardiac: S1/S2, RRR, has murmurs, No gallops or rubs. Respiratory: Has mild wheezing bilaterally GU: No hematuria Ext: No pitting leg edema bilaterally. 2+DP/PT pulse bilaterally. Musculoskeletal: No joint deformities, No joint redness or warmth, no limitation of ROM in spin. Skin: No rashes.  Neuro: Alert, oriented X3, cranial nerves II-XII grossly intact, moves all extremities normally. Psych: Patient is not psychotic, no suicidal or hemocidal ideation.  Labs on Admission: I have personally reviewed following labs and imaging studies  CBC: Recent Labs  Lab 03/15/19 1053  WBC 7.3  NEUTROABS 5.4  HGB 11.2*  HCT 35.6*  MCV 107.2*  PLT 106*   Basic Metabolic Panel: Recent Labs  Lab 03/15/19 1053  NA 139  K 6.0*  CL 95*  CO2 21*  GLUCOSE 106*  BUN 76*  CREATININE 14.36*  CALCIUM 8.4*   GFR: Estimated Creatinine Clearance: 6.1 mL/min (A) (by C-G formula based on SCr of 14.36 mg/dL (H)). Liver Function Tests: Recent Labs  Lab 03/15/19 1053  AST 29  ALT 21  ALKPHOS 167*  BILITOT 1.4*  PROT 8.0  ALBUMIN 3.5   No results for input(s): LIPASE, AMYLASE in the last 168 hours. No results for input(s): AMMONIA in the last 168 hours. Coagulation Profile: Recent Labs  Lab 03/15/19 1448  INR 3.9*   Cardiac Enzymes: No results for input(s): CKTOTAL, CKMB, CKMBINDEX, TROPONINI in the last 168 hours. BNP (last 3 results) No results for input(s): PROBNP in the last 8760 hours. HbA1C: No results for input(s): HGBA1C in the last 72 hours. CBG: Recent Labs  Lab 03/15/19 1357  GLUCAP 86   Lipid Profile: Recent Labs    03/15/19 1053  TRIG 179*   Thyroid  Function Tests: No results for input(s): TSH, T4TOTAL, FREET4, T3FREE, THYROIDAB in the last 72 hours. Anemia Panel: Recent Labs    03/15/19 1053  FERRITIN 1,828*   Urine analysis:    Component Value Date/Time   COLORURINE AMBER (A) 07/17/2016 1531   APPEARANCEUR CLOUDY (A) 07/17/2016 1531   LABSPEC 1.030 07/17/2016 1531   PHURINE 8.0 07/17/2016 1531   GLUCOSEU 50 (A) 07/17/2016 1531   HGBUR NEGATIVE 07/17/2016 1531   BILIRUBINUR NEGATIVE 07/17/2016 1531   KETONESUR NEGATIVE 07/17/2016 1531   PROTEINUR 100 (A) 07/17/2016 1531   NITRITE NEGATIVE 07/17/2016 1531   LEUKOCYTESUR NEGATIVE 07/17/2016 1531   Sepsis Labs: @LABRCNTIP (procalcitonin:4,lacticidven:4) )No results found for this or any previous visit (from the past 240 hour(s)).   Radiological Exams on Admission: Dg Chest Port 1 View  Result Date: 03/15/2019 CLINICAL DATA:  Shortness of breath and weakness. Fever. COVID-19 positive. EXAM: PORTABLE CHEST 1 VIEW COMPARISON:  Chest x-ray dated 11/10/2018 and CT scan dated 03/02/2019 FINDINGS: The patient has developed extensive faint bilateral pulmonary infiltrates. Chronic cardiomegaly. Pulmonary vascularity is at the upper limits of normal. No effusions. No acute bone abnormality. CABG. IMPRESSION: 1. New extensive faint bilateral pulmonary infiltrates since the prior CT scan. 2. Chronic cardiomegaly. 3. Pulmonary vascularity at upper limits of normal. Electronically Signed   By: Lorriane Shire M.D.   On: 03/15/2019 11:15     EKG: Independently reviewed.  Sinus rhythm, QTC 538, flutter, RAD, nonspecific T wave change   Assessment/Plan Principal Problem:   Acute respiratory disease due to  COVID-19 virus Active Problems:   S/P AVR (aortic valve replacement)   ESRD on dialysis (HCC)   CAD (coronary artery disease)   Hyperlipidemia   Essential hypertension   OSA (obstructive sleep apnea)   Gout   Anemia in chronic kidney disease   Atrial fibrillation with RVR (HCC)    Chronic systolic (congestive) heart failure (HCC)   S/P CABG x 3   Acute respiratory disease due to COVID-19 virus: current O2 sat 96% on RA -will admit to med surg bed as inpt -Remdesivir per pharm -Solumedrol 40 mg bid -vitamin C, zinc.  -Atrovent inhaler, PRN albuterol inhaler -PRN Mucinex for cough -f/u Blood culture -Gentle IV fluid:  -checked D-dimer, BNP,Trop, LFT, CRP, LDH, Procalcitonin, ferritin, fibinogen, Hep B SAg -Daily CRP, Ferritin, D-dimer, w-if desaturate, Will ask the patient to maintain an awake prone position for 16+ hours a day, if possible, with a minimum of 2-3 hours at a time -IF patient deteriorates, will consult PCCM and ID  S/P AVR (aortic valve replacement): -coumadin per pharm  Atrial fibrillation with RVR: Hr 110-120s. -metoprolol and amiodarone -coumadin per pharm  ESRD on dialysis [facility]): -renal, Dr. Candiss Norse was consulted  Hx of CAD (coronary artery disease): s/p CABG. No CP. -ASA and statin  Hyperlipidemia: -statin  HTN:  -Continue home medications: metoprolol - hydralazine prn  OSA (obstructive sleep apnea): -CPAP  Gout: -continue home allopurinol  Anemia in chronic kidney disease: Hgb stable, 11.2 -f/u by CBC  Chronic systolic (congestive) heart failure ([place]): EF 45-50% -volume management per renal by HD   Inpatient status:  # Patient requires inpatient status due to high intensity of service, high risk for further deterioration and high frequency of surveillance required.  I certify that at the point of admission it is my clinical judgment that the patient will require inpatient hospital care spanning beyond 2 midnights from the point of admission.  . This patient has multiple chronic comorbidities including  ESRD-HD (MWF), hypertension, hyperlipidemia, GERD, gout, mechanic aortic valve replacement on Coumadin, CAD, CABG, GI bleeding, failed renal transplantation, renal cell carcinoma, PAF on Coumadin, sCHF with EF 45-50%,  ESRD-HD, anemia, arthritis on chronic prednisone, GERD, gout . Now patient has presenting with  Acute respiratory disease due to COVID-19 virus, hypokalemia, A. fib with RVR . The worrisome physical exam findings include wheezing on auscultation, heart murmur . The initial radiographic and laboratory data are worrisome because of bilateral extensive lung infiltration on chest x-ray, hypokalemia, . Current medical needs: please see my assessment and plan . Predictability of an adverse outcome (risk): Patient has multiple comorbidities, now presents with acute respiratory disease due to COVID-19 virus, hypokalemia, A. fib with RVR.  His presentation is highly complicated.  Patient is at high risk of deteriorating.  Will need to be treated in hospital for at least 2 days           DVT ppx: on couamdin Code Status: Full code Family Communication: None at bed side. Disposition Plan:  Anticipate discharge back to previous home environment Consults called:  Dr. Candiss Norse Admission status: med-surg as inpt    Date of Service 03/15/2019    [facility]   If 7PM-7AM, please contact night-coverage [email or web address] Password [facility] 03/15/2019, 10:54 PM

## 2019-03-15 NOTE — Progress Notes (Signed)
PRE-HD   03/15/19 1820  Hand-Off documentation  Handoff Received Received from shift RN/LPN  Report received from (Full Name) Cory Roughen  Vital Signs  Temp 98 F (36.7 C)  Temp Source Oral  Pulse Rate 98  Pulse Rate Source Monitor  Resp 18  BP (!) 185/88  BP Location Right Arm  BP Method Automatic  Patient Position (if appropriate) Lying  Oxygen Therapy  O2 Device Room Air  Pain Assessment  Pain Scale 0-10  Pain Score 0  Dialysis Weight  Weight 94 kg  Type of Weight Pre-Dialysis  Time-Out for Hemodialysis  What Procedure? hd  Pt Identifiers(min of two) First/Last Name;MRN/Account#  Correct Site? Yes  Correct Side? Yes  Correct Procedure? Yes  Consents Verified? Yes  Rad Studies Available? N/A  Safety Precautions Reviewed? Yes  Engineer, civil (consulting) Number 5  Station Number  (229)  UF/Alarm Test Passed  Conductivity: Meter  (13.6)  Conductivity: Machine  13.4  pH 7.3  Reverse Osmosis 1366  Normal Saline Lot Number [id number]  Dialyzer Lot Number [id number]  Disposable Set Lot Number [id number]  Machine Temperature 98.6 F (37 C)  Musician and Audible Yes  Blood Lines Intact and Secured Yes  Pre Treatment Patient Checks  Vascular access used during treatment Fistula  Hepatitis B Surface Antigen Results Pending  Isolation Initiated Yes  Date Hepatitis B Surface Antibody Drawn 03/08/19  Hemodialysis Consent Verified Yes  Hemodialysis Standing Orders Initiated Yes  ECG (Telemetry) Monitor On Yes  Prime Ordered Normal Saline  Length of  DialysisTreatment -hour(s) 2.5 Hour(s)  Dialyzer Elisio 17H NR  Dialysate 1K;2.5 Ca  Variable Sodium  (140)  Dialysis Anticoagulant None  Dialysate Flow Ordered 600  Blood Flow Rate Ordered 400 mL/min  Ultrafiltration Goal 2 Liters  Pre Treatment Labs Hepatitis B Surface Antigen  Education / Care Plan  Dialysis Education Provided Yes  Documented Education in Care Plan Yes  Fistula / Graft Left Forearm Arteriovenous  fistula  No Placement Date or Time found.   Placed prior to admission: Yes  Orientation: Left  Access Location: Forearm  Access Type: Arteriovenous fistula  Site Condition No complications  Fistula / Graft Assessment Present;Thrill;Bruit  Status Accessed  Needle Size 15

## 2019-03-15 NOTE — ED Notes (Signed)
Received report from Arabi, Therapist, sports.

## 2019-03-15 NOTE — ED Notes (Signed)
Pt on personal phone with family. Pt updated on room number and plan.

## 2019-03-15 NOTE — ED Triage Notes (Signed)
Pt arrived from home via [facility]. Pt c/o SOB and weakness yesterday and today. Pt states he had a fever of 104 this morning. Pt received positive COVID result on Dec.6th. Pt had O2 sat of 85 upon EMS arrival, EMS placed pt of 4L O2 [place] and sats were mid 90's.

## 2019-03-15 NOTE — Progress Notes (Signed)
HD START 

## 2019-03-15 NOTE — Progress Notes (Signed)
[place], [name] 03/15/19  Subjective:   Hospital day # 0 Presented to hospital via EMS for weakness and SOB, Fever at home 104 Covid pos Dec 6 Neuro: no c/o headaches. C/o generalized weakness Cvs:nop chest pain Pulm: congestion, cough, mild SOB, COVID-19 +;  Gi: nausea, dry heaving Renal: last dialysis was on Friday. Missed Monday due to weakness.    Objective:  Vital signs in last 24 hours:  Temp:  [103 F (39.4 C)] 103 F (39.4 C) (12/08 1045) Pulse Rate:  [115-124] 117 (12/08 1200) Resp:  [15-27] 27 (12/08 1200) BP: (162-197)/(82-109) 162/82 (12/08 1200) SpO2:  [93 %-96 %] 93 % (12/08 1200) Weight:  [87 kg] 87 kg (12/08 1047)  Weight change:  Filed Weights   03/15/19 1047  Weight: 87 kg    Intake/Output:   No intake or output data in the 24 hours ending 03/15/19 1246   Physical Exam: General: Ill appearing  HEENT Moist oral mucus membranes  Pulm/lungs Cough, coarse, normal breathing effort  CVS/Heart regular  Abdomen:  Soft, NT  Extremities: No edema  Neurologic: Alert, oriented  Skin: No acute rashes  Access: Left arm AVF       Basic Metabolic Panel:  Recent Labs  Lab 03/15/19 1053  NA 139  K 6.0*  CL 95*  CO2 21*  GLUCOSE 106*  BUN 76*  CREATININE 14.36*  CALCIUM 8.4*     CBC: Recent Labs  Lab 03/15/19 1053  WBC 7.3  NEUTROABS 5.4  HGB 11.2*  HCT 35.6*  MCV 107.2*  PLT 124*     No results found for: HEPBSAG, HEPBSAB, HEPBIGM    Microbiology:  No results found for this or any previous visit (from the past 240 hour(s)).  Coagulation Studies: No results for input(s): LABPROT, INR in the last 72 hours.  Urinalysis: No results for input(s): COLORURINE, LABSPEC, PHURINE, GLUCOSEU, HGBUR, BILIRUBINUR, KETONESUR, PROTEINUR, UROBILINOGEN, NITRITE, LEUKOCYTESUR in the last 72 hours.  Invalid input(s): APPERANCEUR    Imaging: Dg Chest Port 1 View  Result Date: 03/15/2019 CLINICAL DATA:   Shortness of breath and weakness. Fever. COVID-19 positive. EXAM: PORTABLE CHEST 1 VIEW COMPARISON:  Chest x-ray dated 11/10/2018 and CT scan dated 03/02/2019 FINDINGS: The patient has developed extensive faint bilateral pulmonary infiltrates. Chronic cardiomegaly. Pulmonary vascularity is at the upper limits of normal. No effusions. No acute bone abnormality. CABG. IMPRESSION: 1. New extensive faint bilateral pulmonary infiltrates since the prior CT scan. 2. Chronic cardiomegaly. 3. Pulmonary vascularity at upper limits of normal. Electronically Signed   By: Lorriane Shire M.D.   On: 03/15/2019 11:15     Medications:       Assessment/ Plan:  63 y.o. male with end stage renal disease on hemodialysis secondary to IgA nephropathy, status post renal transplant x2 (1996->removed due to RCC, 2014->2017), restarted on hemodialysis 11/2015, hypertension, gout, aortic valve replacement on warfarin, Atrial fibrillation, hyperlipidemia, coronary artery disease, congestive heart failure, obstructive sleep apnea  HEP B IMMUNE B 487 ON 12/08/2018  [facility] rD/ 225 MIN// EDW 87 kg//MWF  1.  End-stage renal disease -Urgent hemodialysis requested because patient missed treatment on Monday.  Last dialysis was Friday. T=2.5 hrs covid protocol Patient does not look volume overload; UF goal 0.5 kg Will re-evaluate for HD daily  2.  Hyperkalemia -Expected to correct with hemodialysis today  3. COVID + pneumonia Management as per hospitalist team  4. Anemia of CKD  Lab Results  Component Value Date   HGB  11.2 (L) 03/15/2019        LOS: 0 Cole Klugh 12/8/202012:46 PM  [place], [place]  Note: This note was prepared with Dragon dictation. Any transcription errors are unintentional

## 2019-03-15 NOTE — ED Notes (Addendum)
Attending notified of critical Trop 380. No new orders.

## 2019-03-15 NOTE — ED Notes (Addendum)
All bloodwork collected now.

## 2019-03-15 NOTE — ED Provider Notes (Addendum)
[facility] Emergency Department Provider Note   ____________________________________________    I have reviewed the triage vital signs and the nursing notes.   HISTORY  Chief Complaint Shortness of Breath     HPI Jeremy [name] is a 63 y.o. male with a history of atrial fibrillation on warfarin, CAD, end-stage renal disease, Monday Wednesday Friday who presents with shortness of breath, fatigue, cough, weakness.  He reports he received positive coronavirus test result on Sunday, had been tested 5 days prior to that.  Reports fever, chills.  No chest pain.  Missed dialysis on Monday because he feels ill.  Past Medical History:  Diagnosis Date  . 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 Aortic stenosis    a. 2014 s/p [facility]ch AVR ([facility])-->chronic coumadin; b. 08/2018 Echo: Mild AS/AI. Nl fxn/gradient.  . Arthritis   . Atrial fibrillation ([place])   . CAD (coronary artery disease)    a. 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 02/2018 MV [facility]): large inf, infsept, inflat infarct w/ min rev, EF 30%; c. 06/2018 Cath: LM min irregs, LAD 60p, 95/30/2m, LCX min irregs, RCA 40p, 34m, 90d, RPAV 90, RPDA 95ost; c. 09/2018 CABGx3 (LIMA->LAD, VG->D1, VG->PDA).  . Chronic anticoagulation 06/2012   a. Coumadin in the setting of mech AVR and afib.  Marland Kitchen CPAP (continuous positive airway pressure) dependence   . Dialysis patient [facility]) 04/07/2006   Patient started HD around 1996,  had 1st transplant LLQ around 1998 until 2007 when they found renal cell cancer in transplant and both native kidneys, all were removed > went back on HD until 2nd transplant RLQ in 2014 which lasted 3 yrs; it was embolized for suspected acute rejection. Is currently on HD MWF in [place] w/ [facility] nephrology.  . ESRD (end stage renal disease) ([place])    a. CKD 2/2 to IgA nephropathy (dx age 60); b. 1999 s/p Kidney transplant; c. 2014 Bilat  nephrectomy (transplanted kidney resected) in the setting of renal cell carcinoma; d. PD until 11/2017-->HD.  Marland Kitchen Heart murmur   . HFrEF (heart failure with reduced ejection fraction) ([place])    a. 07/2016 Echo: EF 55-60%; b. 02/2018 MV: EF 30%; c. 04/2018 Echo: EF 35%; d. 08/2018 Echo: EF 30-35%. Glob HK w/o rwma. Mildly reduced RV fxn. Mod to sev dil LA. Nl AoV fxn.  Marland Kitchen Hx of colonoscopy    2009 by Dr. Laural Golden. Normal except for small submucosal lipoma. No evidence of polyps or colitis.  . Hyperlipidemia   . Hypertension   . Hypogonadism in male 07/25/2015  . Ischemic cardiomyopathy    a. 07/2016 Echo: EF 55-60%; b. 02/2018 MV: EF 30%; c. 04/2018 Echo: EF 35%; d. 08/2018 Echo: EF 30-35%.  . OSA (obstructive sleep apnea)    No CPAP  . Permanent atrial fibrillation (HCC)    a. CHA2DS2VASc = 3-->Coumadin (also mech AVR); b. 06/2018 Amio started 2/2 elevated rates.  . Renal cell cancer ([place])   . Renal cell carcinoma ([place]) 08/04/2016    Patient Active Problem List   Diagnosis Date Noted  . Acute respiratory disease due to COVID-19 virus 03/15/2019  . Pain in finger of right hand 01/27/2019  . Skin lesion 11/17/2018  . Complication of vascular access for dialysis 10/01/2018  . S/P CABG x 3 09/16/2018  . Coronary artery disease of native artery of native heart with stable angina pectoris ([place]) 09/13/2018  .  Encounter for therapeutic drug monitoring 09/02/2018  . Hydrocele 08/16/2018  . Penile pain 08/14/2018  . Chronic systolic (congestive) heart failure ([place])   . IBS (irritable bowel syndrome) 03/19/2018  . Abnormal stress test 03/19/2018  . URI (upper respiratory infection) 03/19/2018  . Muscle strain 03/19/2018  . Cut of lower leg 11/17/2017  . Depression 05/17/2017  . AV fistula ([place]) 05/17/2017  . Chronic pain of both knees 05/17/2017  . Arthralgia 05/17/2017  . Rash 05/17/2017  . Mild protein-calorie malnutrition ([place]) 04/29/2017  . Osteoarthritis 03/17/2017  . Trigger little finger of  right hand 03/17/2017  . Hyperkalemia 03/17/2017  . Liver disease, unspecified 03/17/2017  . Other abnormal glucose 03/17/2017  . Other disorders of electrolyte and fluid balance, not elsewhere classified 03/17/2017  . Other disorders resulting from impaired renal tubular function 03/17/2017  . Unspecified jaundice 03/17/2017  . 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 Atrial fibrillation ([place]) [I48.91] 10/30/2016  . Ventral hernia 10/14/2016  . History of renal cell carcinoma 08/04/2016  . Renal transplant failure and rejection 07/31/2016  . Dependence on renal dialysis ([place]) 07/01/2016  . Coagulation defect, unspecified ([place]) 11/09/2015  . Diarrhea, unspecified 11/09/2015  . Pain, unspecified 11/09/2015  . Pruritus, unspecified 11/09/2015  . Shortness of breath 11/09/2015  . AKI (acute kidney injury) ([place]) 10/20/2015  . Volume overload 10/20/2015  . Microhematuria 10/01/2015  . Transplant recipient 10/01/2015  . Hypogonadism in male 07/25/2015  . 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 Gout 04/24/2015  . Visit for biopsy 01/03/2015  . Proteinuria 01/03/2015  . Chronic anticoagulation 11/06/2014  . Iron deficiency anemia 11/02/2014  . Rectal bleeding 11/02/2014  . Malignant neoplasm of unspecified kidney, except renal pelvis ([place]) 05/09/2014  . Epigastric hernia 02/08/2014  . Incisional hernia, without obstruction or gangrene 01/30/2014  . 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[place]) 08/16/2013  . Vitamin D deficiency 08/16/2013  . Cervical radicular pain 07/07/2013  . Neck pain 07/07/2013  . Spinal stenosis 06/20/2013  . OSA (obstructive sleep apnea)   . Edema 02/22/2013  . Immunosuppression ([place]) 02/22/2013  . Hernia of abdominal wall 01/17/2013  . Hypomagnesemia 01/03/2013  . Tremor 12/30/2012  . History of kidney transplant 12/15/2012  . Immunosuppressive management encounter following kidney transplant 12/15/2012  . Kidney transplant status  12/15/2012  . S/P angioplasty with stent 09/07/2012  . Aortic valve replaced 09/07/2012  . Lumbar radiculopathy 09/06/2012  . ESRD on dialysis ([place]) 07/26/2012  . Hyperlipidemia 07/26/2012  . S/P AVR (aortic valve replacement) 06/23/2012  . Aortic valve disorder 06/14/2012  . Heart failure ([place]) 05/30/2012  . Thyroid nodule 06/26/2011  . Status post percutaneous transluminal coronary angioplasty 05/27/2011  . CAD (coronary artery disease) 05/26/2011  . Essential hypertension 05/19/2011    Past Surgical History:  Procedure Laterality Date  . AORTIC VALVE REPLACEMENT  3 /11 /2014   At [place]    . CAPD INSERTION N/A 02/19/2017   Procedure: LAPAROSCOPIC INSERTION CONTINUOUS AMBULATORY PERITONEAL DIALYSIS  (CAPD) CATHETER;  Surgeon: Algernon Huxley, MD;  Location: [facility] OR[facility];  Service: General;  Laterality: N/A;  . CORONARY ANGIOPLASTY WITH STENT PLACEMENT    . CORONARY ARTERY BYPASS GRAFT N/A 09/16/2018   Procedure: CORONARY ARTERY BYPASS GRAFT times three      with left internal mammary artery and endoharvest of right leg greater saphenous vein;  Surgeon: Melrose Nakayama, MD;  Location: [place];  Service: Open Heart Surgery;  Laterality: N/A;  .  DG AV DIALYSIS  SHUNT ACCESS EXIST*L* OR     left arm  . EPIGASTRIC HERNIA REPAIR N/A 02/19/2017   Procedure: HERNIA REPAIR EPIGASTRIC ADULT;  Surgeon: Robinette Esters Bellow, MD;  Location: [facility] OR[facility];  Service: General;  Laterality: N/A;  . EYE SURGERY     bilateral cataract  . HERNIA REPAIR     UMBILICAL HERNIA  . HERNIA REPAIR  02/19/2017  . INNER EAR SURGERY     20 years ago  . KIDNEY TRANSPLANT    . Peritoneal Dialysis access placement  02/19/2017  . REMOVAL OF A DIALYSIS CATHETER N/A 11/23/2017   Procedure: REMOVAL OF A PERITONEAL DIALYSIS CATHETER;  Surgeon: Algernon Huxley, MD;  Location: [facility] OR[facility];  Service: Vascular;  Laterality: N/A;  . RIGHT/LEFT HEART CATH AND CORONARY ANGIOGRAPHY  Bilateral 06/07/2018   Procedure: RIGHT/LEFT HEART CATH AND CORONARY ANGIOGRAPHY;  Surgeon: Wellington Hampshire, MD;  Location: [place] [facility];  Service: Cardiovascular;  Laterality: Bilateral;  . TEE WITHOUT CARDIOVERSION N/A 07/21/2016   Procedure: TRANSESOPHAGEAL ECHOCARDIOGRAM (TEE);  Surgeon: Wellington Hampshire, MD;  Location: [facility];  Service: Cardiovascular;  Laterality: N/A;  . TEE WITHOUT CARDIOVERSION N/A 09/16/2018   Procedure: TRANSESOPHAGEAL ECHOCARDIOGRAM (TEE);  Surgeon: Melrose Nakayama, MD;  Location: [place];  Service: Open Heart Surgery;  Laterality: N/A;    Prior to Admission medications   Medication Sig Start Date End Date Taking? Authorizing Provider  acetaminophen (TYLENOL) 500 MG tablet Take 1,000 mg 2 (two) times daily as needed by mouth for moderate pain.    [provider]  allopurinol (ZYLOPRIM) 300 MG tablet Take 300 mg by mouth daily. 12/23/17   [provider]  amiodarone (PACERONE) 200 MG tablet Take 1 tablet (200 mg total) by mouth daily. 10/07/18   Theora Gianotti, NP  aspirin EC 81 MG EC tablet Take 1 tablet (81 mg total) by mouth daily. 09/23/18   Gold, Wayne E, PA-C  AURYXIA 1 GM 210 MG(Fe) tablet TAKE 2 TABLETS BY MOUTH THREE TIMES A DAY WITH MEALS 10/13/18   [provider]  cinacalcet (SENSIPAR) 30 MG tablet Take 30 mg by mouth every other day.     [provider]  Darbepoetin Alfa (ARANESP) 100 MCG/0.5ML SOSY injection Inject 0.5 mLs (100 mcg total) into the vein every Monday with hemodialysis. 09/27/18   John Giovanni, PA-C  Dextrose-Fructose-Sod Citrate (NAUZENE PO) Take 1-2 tablets 2 (two) times daily as needed by mouth (nausea).    [provider]  folic acid-vitamin b complex-vitamin c-selenium-zinc (DIALYVITE) 3 MG TABS tablet Take 1 tablet by mouth daily.    [provider]  lidocaine-prilocaine (EMLA) cream Apply 1 application topically as needed (for fistula).    [provider]   metoprolol tartrate (LOPRESSOR) 25 MG tablet TAKE 1/2 TABLETS (12.5 MG TOTAL) BY MOUTH 2 (TWO) TIMES DAILY. 01/25/19   Melrose Nakayama, MD  midodrine (PROAMATINE) 10 MG tablet Take 10 mg by mouth 2 (two) times daily as needed (for low BP).    [provider]  multivitamin (RENA-VIT) TABS tablet Take 1 tablet by mouth daily. 02/23/19   [provider]  omeprazole (PRILOSEC) 20 MG capsule Take 20 mg by mouth daily.    [provider]  pravastatin (PRAVACHOL) 20 MG tablet Take 1 tablet (20 mg total) by mouth every evening. 12/08/18 03/08/19  Wellington Hampshire, MD  predniSONE (DELTASONE) 5 MG tablet Take 5 mg by mouth daily with breakfast.  10/12/16  [provider]  sevelamer carbonate (RENVELA) 800 MG tablet Take 40,000 mg by mouth 3 (three) times daily with meals. 5 tabs per meal    [provider]  warfarin (COUMADIN) 1 MG tablet Take 1 tablet (1 mg total) by mouth daily at 6 PM. Patient taking differently: Take 1-2 mg by mouth See admin instructions. 1 mg all days , except 0.5 MG on Fridays..* OR AS DIRECTED 08/05/18   Wellington Hampshire, MD     Allergies Statins and Sertraline  Family History  Problem Relation Age of Onset  . Cancer Mother        pancreatic  . Heart disease Father   . Diabetes Father     Social History Social History   Tobacco Use  . Smoking status: Never Smoker  . Smokeless tobacco: Never Used  Substance Use Topics  . Alcohol use: No  . Drug use: No    Review of Systems  Constitutional: As above Eyes: No visual changes.  ENT: No sore throat. Cardiovascular: Denies chest pain. Respiratory: As above Gastrointestinal: No abdominal pain.     Genitourinary: Negative for dysuria. Musculoskeletal: Negative for back pain. Skin: Negative for rash. Neurological: Positive headaches   ____________________________________________   PHYSICAL EXAM:  VITAL SIGNS: ED Triage Vitals  Enc Vitals Group     BP 03/15/19  1045 (!) 197/109     Pulse Rate 03/15/19 1045 (!) 124     Resp 03/15/19 1045 15     Temp 03/15/19 1045 (!) 103 F (39.4 C)     Temp Source 03/15/19 1045 Oral     SpO2 03/15/19 1045 95 %     Weight 03/15/19 1047 87 kg (191 lb 12.8 oz)     Height 03/15/19 1047 1.778 m (5\' 10" )     Head Circumference --      Peak Flow --      Pain Score 03/15/19 1047 6     Pain Loc --      Pain Edu? --      Excl. in [place]? --     Constitutional: Alert and oriented.  Nose: No congestion/rhinnorhea. Mouth/Throat: Mucous membranes are moist.   Neck:  Painless ROM Cardiovascular: Tachycardia, regular rhythm. Grossly normal heart sounds.  Good peripheral circulation. Respiratory: Normal respiratory effort.  No retractions.  Bibasilar Rales Gastrointestinal: Soft and nontender. No distention.  No CVA tenderness.  Musculoskeletal: No lower extremity tenderness nor edema.  Warm and well perfused, left AV fistula, positive thrill Neurologic:  Normal speech and language. No gross focal neurologic deficits are appreciated.  Skin:  Skin is warm, dry and intact. No rash noted. Psychiatric: Mood and affect are normal. Speech and behavior are normal.  ____________________________________________   LABS (all labs ordered are listed, but only abnormal results are displayed)  Labs Reviewed  CBC WITH DIFFERENTIAL/PLATELET - Abnormal; Notable for the following components:      Result Value   RBC 3.32 (*)    Hemoglobin 11.2 (*)    HCT 35.6 (*)    MCV 107.2 (*)    Platelets 124 (*)    All other components within normal limits  COMPREHENSIVE METABOLIC PANEL - Abnormal; Notable for the following components:   Potassium 6.0 (*)    Chloride 95 (*)    CO2 21 (*)    Glucose, Bld 106 (*)    BUN 76 (*)    Creatinine, Ser 14.36 (*)    Calcium 8.4 (*)    Alkaline Phosphatase 167 (*)  Total Bilirubin 1.4 (*)    GFR calc non Af Amer 3 (*)    GFR calc Af Amer 4 (*)    Anion gap 23 (*)    All other components  within normal limits  FIBRIN DERIVATIVES D-DIMER ([facility] ONLY) - Abnormal; Notable for the following components:   Fibrin derivatives D-dimer ([facility]) 1,091.47 (*)    All other components within normal limits  LACTATE DEHYDROGENASE - Abnormal; Notable for the following components:   LDH 259 (*)    All other components within normal limits  FIBRINOGEN - Abnormal; Notable for the following components:   Fibrinogen 692 (*)    All other components within normal limits  POC SARS CORONAVIRUS 2 AG - Abnormal; Notable for the following components:   SARS Coronavirus 2 Ag POSITIVE (*)    All other components within normal limits  CULTURE, BLOOD (ROUTINE X 2)  CULTURE, BLOOD (ROUTINE X 2)  LACTIC ACID, PLASMA  PROCALCITONIN  LACTIC ACID, PLASMA  FERRITIN  TRIGLYCERIDES  C-REACTIVE PROTEIN   ____________________________________________  EKG  ED ECG REPORT I, Lavonia Drafts, the attending physician, personally viewed and interpreted this ECG.  Date: 03/15/2019  Rhythm: Atrial fibrillation QRS Axis: normal Intervals: Abnormal ST/T Wave abnormalities: normal Narrative Interpretation: no evidence of acute ischemia  ____________________________________________  RADIOLOGY  Chest x-ray demonstrates faint bilateral pulmonary infiltrates ____________________________________________   PROCEDURES  Procedure(s) performed: No  Procedures   Critical Care performed: yes  CRITICAL CARE Performed by: Lavonia Drafts   Total critical care time: 30 minutes  Critical care time was exclusive of separately billable procedures and treating other patients.  Critical care was necessary to treat or prevent imminent or life-threatening deterioration.  Critical care was time spent personally by me on the following activities: development of treatment plan with patient and/or surrogate as well as nursing, discussions with consultants, evaluation of patient's response to treatment, examination of  patient, obtaining history from patient or surrogate, ordering and performing treatments and interventions, ordering and review of laboratory studies, ordering and review of radiographic studies, pulse oximetry and re-evaluation of patient's condition.  ____________________________________________   INITIAL IMPRESSION / ASSESSMENT AND PLAN / ED COURSE  Pertinent labs & imaging results that were available during my care of the patient were reviewed by me and considered in my medical decision making (see chart for details).  Patient with extensive past medical history presents with shortness of breath requiring oxygen to keep saturations above 90% in the setting of positive Covid.  Missed dialysis yesterday, also possibility of fluid overload.  Pending labs, chest x-ray. will give IV Decadron, Tylenol  POC coronavirus test in agreement with outpatient tests, positive.  Given that the patient will require end-stage renal disease will need to be admitted here, have consulted the hospitalist for admission  Consulted Dr. Candiss Norse of nephrology, he will arrange for dialysis   ____________________________________________   FINAL CLINICAL IMPRESSION(S) / ED DIAGNOSES  Final diagnoses:  Pneumonia due to COVID-19 virus  Shortness of breath  Acute respiratory failure with hypoxia [facility])        Note:  This document was prepared using Dragon voice recognition software and may include unintentional dictation errors.   Lavonia Drafts, MD 03/15/19 1236    Lavonia Drafts, MD 03/15/19 1245

## 2019-03-15 NOTE — Telephone Encounter (Signed)
Noted. His symptoms all could be related to [place] so agree with need for evaluation prior to starting antibiotic. Agree with other advice as given.

## 2019-03-15 NOTE — Progress Notes (Signed)
ANTICOAGULATION CONSULT NOTE - Initial Consult  Pharmacy Consult for warfarin Indication: atrial fibrillation and hx mech AVR  Allergies  Allergen Reactions  . Statins     Other reaction(s): Arthralgias (intolerance), Myalgias (intolerance)  Pt taking pravastatin   . Sertraline Rash    Patient Measurements: Height: 5\' 10"  (177.8 cm) Weight: 191 lb 12.8 oz (87 kg) IBW/kg (Calculated) : 73  Vital Signs: Temp: 103 F (39.4 C) (12/08 1045) Temp Source: Oral (12/08 1045) BP: 171/92 (12/08 1435) Pulse Rate: 108 (12/08 1435)  Labs: Recent Labs    03/15/19 1053 03/15/19 1448  HGB 11.2*  --   HCT 35.6*  --   PLT 124*  --   LABPROT  --  38.4*  INR  --  3.9*  CREATININE 14.36*  --     Estimated Creatinine Clearance: 5.5 mL/min (A) (by C-G formula based on SCr of 14.36 mg/dL (H)).   Medical History: Past Medical History:  Diagnosis Date  . 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 Aortic stenosis    a. 2014 [facility]R ([facility])-->chronic coumadin; b. 08/2018 Echo: Mild AS/AI. Nl fxn/gradient.  . Arthritis   . Atrial fibrillation ([place])   . CAD (coronary artery disease)    a. 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 02/2018 MV [facility]): large inf, infsept, inflat infarct w/ min rev, EF 30%; c. 06/2018 Cath: LM min irregs, LAD 60p, 95/30/42m, LCX min irregs, RCA 40p, 32m, 90d, RPAV 90, RPDA 95ost; c. 09/2018 CABGx3 (LIMA->LAD, VG->D1, VG->PDA).  . Chronic anticoagulation 06/2012   a. Coumadin in the setting of mech AVR and afib.  Marland Kitchen CPAP (continuous positive airway pressure) dependence   . Dialysis patient [facility]) 04/07/2006   Patient started HD around 1996,  had 1st transplant LLQ around 1998 until 2007 when they found renal cell cancer in transplant and both native kidneys, all were removed > went back on HD until 2nd transplant RLQ in 2014 which lasted 3 yrs; it was embolized for suspected acute rejection. Is currently  on HD MWF in [place] w/ [facility] nephrology.  . ESRD (end stage renal disease) ([place])    a. CKD 2/2 to IgA nephropathy (dx age 20); b. 1999 s/p Kidney transplant; c. 2014 Bilat nephrectomy (transplanted kidney resected) in the setting of renal cell carcinoma; d. PD until 11/2017-->HD.  Marland Kitchen Heart murmur   . HFrEF (heart failure with reduced ejection fraction) ([place])    a. 07/2016 Echo: EF 55-60%; b. 02/2018 MV: EF 30%; c. 04/2018 Echo: EF 35%; d. 08/2018 Echo: EF 30-35%. Glob HK w/o rwma. Mildly reduced RV fxn. Mod to sev dil LA. Nl AoV fxn.  Marland Kitchen Hx of colonoscopy    2009 by Dr. Laural Golden. Normal except for small submucosal lipoma. No evidence of polyps or colitis.  . Hyperlipidemia   . Hypertension   . Hypogonadism in male 07/25/2015  . Ischemic cardiomyopathy    a. 07/2016 Echo: EF 55-60%; b. 02/2018 MV: EF 30%; c. 04/2018 Echo: EF 35%; d. 08/2018 Echo: EF 30-35%.  . OSA (obstructive sleep apnea)    No CPAP  . Permanent atrial fibrillation (HCC)    a. CHA2DS2VASc = 3-->Coumadin (also mech AVR); b. 06/2018 Amio started 2/2 elevated rates.  . Renal cell cancer ([place])   . Renal cell carcinoma ([place]) 08/04/2016      Assessment: 63 yo male on warfarin PTA for AFib and mechanical AVR (2014). Pharmacy  consulted to continue dosing warfarin. Per med rec pt taking warfarin 1 mg all days except 0.5 mg on Friday (med rec done by tech by calling spouse who stated dose has been changing; pt last took meds Sunday night).   12/8 INR 3.9  Goal of Therapy:  INR 2.5-3.5 - confirmed INR goal with admitting MD Monitor platelets by anticoagulation protocol: Yes   Plan:  INR today is supratherapeutic. Will hold warfarin for tonight and see how it trends. F/u INR in AM. CBC at least every three days per policy.   Pharmacy will continue to follow.   Rocky Morel 03/15/2019,3:23 PM

## 2019-03-15 NOTE — ED Notes (Signed)
Report given to Georgie RN 

## 2019-03-15 NOTE — Progress Notes (Signed)
Remdesivir - Pharmacy Brief Note   O:  ALT: 21 CXR: New extensive faint bilateral pulmonary infiltrates since prior CT scan. SpO2: 93% on RA   A/P:  Remdesivir 200 mg IVPB once followed by 100 mg IVPB daily x 4 days.    Jeremy [name] L 03/15/2019 1:00 PM

## 2019-03-15 NOTE — ED Notes (Signed)
Collecting all equipment for labs and meds to give.

## 2019-03-16 ENCOUNTER — Ambulatory Visit: Payer: Medicare Other | Admitting: Nurse Practitioner

## 2019-03-16 DIAGNOSIS — G4733 Obstructive sleep apnea (adult) (pediatric): Secondary | ICD-10-CM

## 2019-03-16 DIAGNOSIS — J1289 Other viral pneumonia: Secondary | ICD-10-CM

## 2019-03-16 LAB — CBC WITH DIFFERENTIAL/PLATELET
Abs Immature Granulocytes: 0.04 10*3/uL (ref 0.00–0.07)
Basophils Absolute: 0 10*3/uL (ref 0.0–0.1)
Basophils Relative: 0 %
Eosinophils Absolute: 0 10*3/uL (ref 0.0–0.5)
Eosinophils Relative: 0 %
HCT: 33.7 % — ABNORMAL LOW (ref 39.0–52.0)
Hemoglobin: 10.9 g/dL — ABNORMAL LOW (ref 13.0–17.0)
Immature Granulocytes: 1 %
Lymphocytes Relative: 10 %
Lymphs Abs: 0.6 10*3/uL — ABNORMAL LOW (ref 0.7–4.0)
MCH: 33.3 pg (ref 26.0–34.0)
MCHC: 32.3 g/dL (ref 30.0–36.0)
MCV: 103.1 fL — ABNORMAL HIGH (ref 80.0–100.0)
Monocytes Absolute: 0.2 10*3/uL (ref 0.1–1.0)
Monocytes Relative: 4 %
Neutro Abs: 5 10*3/uL (ref 1.7–7.7)
Neutrophils Relative %: 85 %
Platelets: 121 10*3/uL — ABNORMAL LOW (ref 150–400)
RBC: 3.27 MIL/uL — ABNORMAL LOW (ref 4.22–5.81)
RDW: 14.6 % (ref 11.5–15.5)
WBC: 5.8 10*3/uL (ref 4.0–10.5)
nRBC: 0 % (ref 0.0–0.2)

## 2019-03-16 LAB — COMPREHENSIVE METABOLIC PANEL
ALT: 20 U/L (ref 0–44)
AST: 35 U/L (ref 15–41)
Albumin: 3.2 g/dL — ABNORMAL LOW (ref 3.5–5.0)
Alkaline Phosphatase: 154 U/L — ABNORMAL HIGH (ref 38–126)
Anion gap: 19 — ABNORMAL HIGH (ref 5–15)
BUN: 57 mg/dL — ABNORMAL HIGH (ref 8–23)
CO2: 27 mmol/L (ref 22–32)
Calcium: 8 mg/dL — ABNORMAL LOW (ref 8.9–10.3)
Chloride: 93 mmol/L — ABNORMAL LOW (ref 98–111)
Creatinine, Ser: 10.25 mg/dL — ABNORMAL HIGH (ref 0.61–1.24)
GFR calc Af Amer: 6 mL/min — ABNORMAL LOW (ref >60)
GFR calc non Af Amer: 5 mL/min — ABNORMAL LOW (ref >60)
Glucose, Bld: 160 mg/dL — ABNORMAL HIGH (ref 70–99)
Potassium: 5.5 mmol/L — ABNORMAL HIGH (ref 3.5–5.1)
Sodium: 139 mmol/L (ref 135–145)
Total Bilirubin: 1.1 mg/dL (ref 0.3–1.2)
Total Protein: 7.7 g/dL (ref 6.5–8.1)

## 2019-03-16 LAB — FIBRIN DERIVATIVES D-DIMER (ARMC ONLY): Fibrin derivatives D-dimer (ARMC): 896.23 ng/mL (FEU) — ABNORMAL HIGH (ref 0.00–499.00)

## 2019-03-16 LAB — PROTIME-INR
INR: 4.2 (ref 0.8–1.2)
Prothrombin Time: 40.4 seconds — ABNORMAL HIGH (ref 11.4–15.2)

## 2019-03-16 LAB — MAGNESIUM: Magnesium: 2.5 mg/dL — ABNORMAL HIGH (ref 1.7–2.4)

## 2019-03-16 LAB — C-REACTIVE PROTEIN: CRP: 26.8 mg/dL — ABNORMAL HIGH (ref <1.0)

## 2019-03-16 LAB — FERRITIN: Ferritin: 2070 ng/mL — ABNORMAL HIGH (ref 24–336)

## 2019-03-16 MED ORDER — EPOETIN ALFA 10000 UNIT/ML IJ SOLN
4000.0000 [IU] | Freq: Once | INTRAMUSCULAR | Status: DC
  Administered 2019-03-17: 4000 [IU] via INTRAVENOUS
  Filled 2019-03-16: qty 1

## 2019-03-16 MED ORDER — SODIUM CHLORIDE 0.9 % IV SOLN
INTRAVENOUS | Status: DC | PRN
  Administered 2019-03-16: 250 mL via INTRAVENOUS
  Administered 2019-03-17: 30 mL via INTRAVENOUS
  Administered 2019-03-18: 10 mL via INTRAVENOUS

## 2019-03-16 MED ORDER — SODIUM ZIRCONIUM CYCLOSILICATE 10 G PO PACK
10.0000 g | PACK | Freq: Once | ORAL | Status: AC
  Administered 2019-03-16: 10 g via ORAL
  Filled 2019-03-16 (×2): qty 1

## 2019-03-16 MED ORDER — HYDROCOD POLST-CPM POLST ER 10-8 MG/5ML PO SUER
5.0000 mL | Freq: Two times a day (BID) | ORAL | Status: DC | PRN
  Administered 2019-03-16 (×2): 5 mL via ORAL
  Filled 2019-03-16 (×2): qty 5

## 2019-03-16 MED ORDER — NEPRO/CARBSTEADY PO LIQD
237.0000 mL | Freq: Three times a day (TID) | ORAL | Status: DC
  Administered 2019-03-16 – 2019-03-18 (×7): 237 mL via ORAL

## 2019-03-16 NOTE — Progress Notes (Signed)
Initial Nutrition Assessment  DOCUMENTATION CODES:   Not applicable  INTERVENTION:   Nepro Shake po TID, each supplement provides 425 kcal and 19 grams protein  Rena-vite daily  NUTRITION DIAGNOSIS:   Increased nutrient needs related to chronic illness(ESRD on HD, COVID 19) as evidenced by increased estimated needs.  GOAL:   Patient will meet greater than or equal to 90% of their needs  MONITOR:   PO intake, Supplement acceptance, Labs, Weight trends, Skin, I & O's  REASON FOR ASSESSMENT:   Malnutrition Screening Tool    ASSESSMENT:   63 y.o. male with medical history significant of ESRD-HD (MWF), hypertension, hyperlipidemia, GERD, gout, mechanic aortic valve replacement on Coumadin, CAD, CABG, GI bleeding, failed renal transplantation, renal cell carcinoma, PAF on Coumadin, sCHF with EF 45-50%, anemia, arthritis on chronic prednisone, GERD, gout, who is admitted with COVID 19   Pt with increased estimated needs r/t ESRD on HD and COVID 19. Pt eating 100% of meals in hospital yesterday. RD will add supplements to help pt meet his estimated needs. Per chart, pt ~16lbs up from his UBW currently.   Medications reviewed and include: allopurinol, aspirin, cinacalcet, solu-medrol, rena-vite, protonix, vitamin C, warfarin, zinc  Labs reviewed: K 5.5(H), Cl 93(L), BUN 57(H), creat 10.25(H), Mg 2.5(H) BNP- 1821(H)- 12/8 Hgb 10.9(L), Hct 33.7(L), MCV 103.1(H)  Unable to complete Nutrition-Focused physical exam at this time as pt with COVID 19.   Diet Order:   Diet Order            Diet renal with fluid restriction Fluid restriction: 1200 mL Fluid; Room service appropriate? Yes; Fluid consistency: Thin  Diet effective now             EDUCATION NEEDS:   No education needs have been identified at this time  Skin:  Skin Assessment: Reviewed RN Assessment  Last BM:  12/7  Height:   Ht Readings from Last 1 Encounters:  03/15/19 5\' 10"  (1.778 m)    Weight:   Wt  Readings from Last 1 Encounters:  03/15/19 94 kg    Ideal Body Weight:  75 kg  BMI:  Body mass index is 29.73 kg/m.  Estimated Nutritional Needs:   Kcal:  2200-2500kcal/day  Protein:  110-125g/day  Fluid:  UOP +1L  Koleen Distance MS, RD, LDN Pager #- [phone] Office#- [phone] After Hours Pager: [phone]

## 2019-03-16 NOTE — Progress Notes (Signed)
[place], [name] 03/16/19  Subjective:   Hospital day # 1 Presented to hospital via EMS for weakness and SOB, Fever at home 104 Covid pos Dec 6 Neuro: no c/o headaches. C/o generalized weakness Cvs:nop chest pain Pulm: congestion, cough, mild SOB, COVID-19 +;  Gi: nausea, dry heaving Renal: last dialysis was on Friday. Missed Monday due to weakness.  No acute events today Clinically improving per nursing staff    Objective:  Vital signs in last 24 hours:  Temp:  [97.8 F (36.6 C)-99.2 F (37.3 C)] 99.2 F (37.3 C) (12/09 1152) Pulse Rate:  [55-116] 80 (12/09 1152) Resp:  [18-20] 20 (12/08 2247) BP: (122-185)/(67-98) 158/68 (12/09 1152) SpO2:  [97 %-100 %] 97 % (12/09 1152) Weight:  [94 kg] 94 kg (12/08 1820)  Weight change:  Filed Weights   03/15/19 1047 03/15/19 1820  Weight: 87 kg 94 kg    Intake/Output:    Intake/Output Summary (Last 24 hours) at 03/16/2019 1630 Last data filed at 03/16/2019 1616 Gross per 24 hour  Intake 0 ml  Output 1500 ml  Net -1500 ml     Physical Exam: Deferred due to Covid positive status, to preserve PPE and minimize exposure.   Basic Metabolic Panel:  Recent Labs  Lab 03/15/19 1053 03/16/19 0615  NA 139 139  K 6.0* 5.5*  CL 95* 93*  CO2 21* 27  GLUCOSE 106* 160*  BUN 76* 57*  CREATININE 14.36* 10.25*  CALCIUM 8.4* 8.0*  MG  --  2.5*     CBC: Recent Labs  Lab 03/15/19 1053 03/16/19 0615  WBC 7.3 5.8  NEUTROABS 5.4 5.0  HGB 11.2* 10.9*  HCT 35.6* 33.7*  MCV 107.2* 103.1*  PLT 124* 121*      Lab Results  Component Value Date   HEPBSAG NON REACTIVE 03/15/2019      Microbiology:  Recent Results (from the past 240 hour(s))  Blood Culture (routine x 2)     Status: None (Preliminary result)   Collection Time: 03/15/19 10:53 AM   Specimen: BLOOD  Result Value Ref Range Status   Specimen Description BLOOD BLOOD RIGHT FOREARM  Final   Special Requests   Final    BOTTLES  DRAWN AEROBIC AND ANAEROBIC Blood Culture adequate volume   Culture   Final    NO GROWTH < 24 HOURS Performed at [facility], [name] [place], [place] [address]    Report Status PENDING  Incomplete  Blood Culture (routine x 2)     Status: None (Preliminary result)   Collection Time: 03/15/19 10:54 AM   Specimen: BLOOD  Result Value Ref Range Status   Specimen Description BLOOD RIGHT WRIST  Final   Special Requests   Final    BOTTLES DRAWN AEROBIC AND ANAEROBIC Blood Culture adequate volume   Culture   Final    NO GROWTH < 24 HOURS Performed at [facility], [address], [place], [place] [address]    Report Status PENDING  Incomplete    Coagulation Studies: Recent Labs    03/15/19 1448 03/16/19 0615  LABPROT 38.4* 40.4*  INR 3.9* 4.2*    Urinalysis: No results for input(s): COLORURINE, LABSPEC, PHURINE, GLUCOSEU, HGBUR, BILIRUBINUR, KETONESUR, PROTEINUR, UROBILINOGEN, NITRITE, LEUKOCYTESUR in the last 72 hours.  Invalid input(s): APPERANCEUR    Imaging: Dg Chest Port 1 View  Result Date: 03/15/2019 CLINICAL DATA:  Shortness of breath and weakness. Fever. COVID-19 positive. EXAM: PORTABLE CHEST 1 VIEW COMPARISON:  Chest  x-ray dated 11/10/2018 and CT scan dated 03/02/2019 FINDINGS: The patient has developed extensive faint bilateral pulmonary infiltrates. Chronic cardiomegaly. Pulmonary vascularity is at the upper limits of normal. No effusions. No acute bone abnormality. CABG. IMPRESSION: 1. New extensive faint bilateral pulmonary infiltrates since the prior CT scan. 2. Chronic cardiomegaly. 3. Pulmonary vascularity at upper limits of normal. Electronically Signed   By: Lorriane Shire M.D.   On: 03/15/2019 11:15     Medications:       Assessment/ Plan:  63 y.o. 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  End-stage renal disease -Hemodialysis planned for tomorrow  2.  Hyperkalemia -Expected to correct with hemodialysis tomorrow -1 dose of lokelma  3. COVID + pneumonia Management as per hospitalist team  4. Anemia of CKD  Lab Results  Component Value Date   HGB 10.9 (L) 03/16/2019  Epogen with hemodialysis treatment.  5.  Secondary hyperparathyroidism -Continued on home dose of Sensipar Restart Renvela with meals     LOS: [place] 12/9/20204:30 PM  [place], [place]  Note: This note was prepared with Dragon dictation. Any transcription errors are unintentional

## 2019-03-16 NOTE — Progress Notes (Signed)
CRITICAL VALUE ALERT  Critical Value:  INR 4.3  Date & Time Notied:  03/16/2019 0757  Provider Notified: Dr. Roderic Palau  Orders Received/Actions taken: acknowledged

## 2019-03-16 NOTE — Progress Notes (Signed)
PROGRESS NOTE    Jeremy [name]  ZOX:[id number] DOB: 1956/03/10 DOA: 03/15/2019 PCP: Leone Haven, MD    Brief Narrative:  63 year old male with history of end-stage renal disease, mechanical aortic valve replacement on Coumadin, paroxysmal atrial fibrillation, chronic systolic heart failure, admitted to the hospital with cough, shortness of breath and fever.  Found to have COVID-19 positive.  Chest x-ray indicated bilateral infiltrates.  CRP markedly elevated over 20.  Started on intravenous steroids and remdesivir.   Assessment & Plan:   Principal Problem:   Acute respiratory disease due to COVID-19 virus Active Problems:   S/P AVR (aortic valve replacement)   ESRD on dialysis (HCC)   CAD (coronary artery disease)   Hyperlipidemia   Essential hypertension   OSA (obstructive sleep apnea)   Gout   Anemia in chronic kidney disease   Atrial fibrillation with RVR (HCC)   Chronic systolic (congestive) heart failure (HCC)   S/P CABG x 3   1. Pneumonia secondary to COVID-19 virus.  Patient noted to have bilateral infiltrates on chest x-ray.  CRP markedly elevated at greater than 20.  Ferritin also significantly elevated.  Currently, patient has been started on remdesivir and intravenous steroids.  Currently, patient is on room air.  If his respiratory status begins to decline, he will need to receive a dose of Actemra.  With his markedly elevated CRP, he would be considered higher risk for decompensation and will need continued monitoring in the hospital for now.  Continue to monitor for now.  He has been encouraged to lay in a prone position for as long as he will tolerate. 2. Status post aortic valve replacement.  Continue anticoagulation with Coumadin 3. Coagulopathy secondary to Coumadin.  No signs of bleeding at this time.  Pharmacy to adjust Coumadin. 4. End-stage renal disease on hemodialysis.  Nephrology following, appreciate assistance. 5. Atrial fibrillation with rapid  ventricular response.  Heart rate was in the 110s to 120s, but has since improved and is now stable.  Continue on metoprolol and amiodarone.  He is anticoagulated with Coumadin. 6. Coronary artery disease.  Status post CABG.  No complaints of chest pain at time.  Continue aspirin and statin. 7. Hyperlipidemia.  Continue statin 8. Hypertension.  Continue on home dose of metoprolol.  Blood pressure currently stable. 9. Obstructive sleep apnea.  Would avoid CPAP at this time due to his Covid infection and risk for aerosolization. 10. Chronic systolic congestive heart failure.  Ejection fraction 45%.  Volume management with dialysis.   DVT prophylaxis: Warfarin Code Status: Full code Family Communication: None present Disposition Plan: Discharge home once remdesivir course is complete   Consultants:     Procedures:     Antimicrobials:    Remdesivir 12/8>   Subjective: Continues to have cough.  Denies any shortness of breath.  No chest pain.  Objective: Vitals:   03/15/19 2247 03/16/19 0405 03/16/19 1007 03/16/19 1152  BP: (!) 141/90 (!) 148/89  (!) 158/68  Pulse: 69 (!) 55 78 80  Resp: 20     Temp: 97.8 F (36.6 C) 97.9 F (36.6 C)  99.2 F (37.3 C)  TempSrc: Oral Oral  Oral  SpO2: 100% 97%  97%  Weight:      Height:        Intake/Output Summary (Last 24 hours) at 03/16/2019 1922 Last data filed at 03/16/2019 1755 Gross per 24 hour  Intake 143.86 ml  Output 1500 ml  Net -1356.14 ml   [employer]  03/15/19 1047 03/15/19 1820  Weight: 87 kg 94 kg    Examination:  General exam: Appears calm and comfortable  Respiratory system: Clear to auscultation. Respiratory effort normal. Cardiovascular system: S1 & S2 heard, RRR. No JVD, murmurs, rubs, gallops or clicks. No pedal edema. Gastrointestinal system: Abdomen is nondistended, soft and nontender. No organomegaly or masses felt. Normal bowel sounds heard. Central nervous system: Alert and oriented. No focal  neurological deficits. Extremities: Symmetric 5 x 5 power. Skin: No rashes, lesions or ulcers Psychiatry: Judgement and insight appear normal. Mood & affect appropriate.     Data Reviewed: I have personally reviewed following labs and imaging studies  CBC: Recent Labs  Lab 03/15/19 1053 03/16/19 0615  WBC 7.3 5.8  NEUTROABS 5.4 5.0  HGB 11.2* 10.9*  HCT 35.6* 33.7*  MCV 107.2* 103.1*  PLT 124* 355*   Basic Metabolic Panel: Recent Labs  Lab 03/15/19 1053 03/16/19 0615  NA 139 139  K 6.0* 5.5*  CL 95* 93*  CO2 21* 27  GLUCOSE 106* 160*  BUN 76* 57*  CREATININE 14.36* 10.25*  CALCIUM 8.4* 8.0*  MG  --  2.5*   GFR: Estimated Creatinine Clearance: 8.6 mL/min (A) (by C-G formula based on SCr of 10.25 mg/dL (H)). Liver Function Tests: Recent Labs  Lab 03/15/19 1053 03/16/19 0615  AST 29 35  ALT 21 20  ALKPHOS 167* 154*  BILITOT 1.4* 1.1  PROT 8.0 7.7  ALBUMIN 3.5 3.2*   No results for input(s): LIPASE, AMYLASE in the last 168 hours. No results for input(s): AMMONIA in the last 168 hours. Coagulation Profile: Recent Labs  Lab 03/15/19 1448 03/16/19 0615  INR 3.9* 4.2*   Cardiac Enzymes: No results for input(s): CKTOTAL, CKMB, CKMBINDEX, TROPONINI in the last 168 hours. BNP (last 3 results) No results for input(s): PROBNP in the last 8760 hours. HbA1C: No results for input(s): HGBA1C in the last 72 hours. CBG: Recent Labs  Lab 03/15/19 1357  GLUCAP 86   Lipid Profile: Recent Labs    03/15/19 1053  TRIG 179*   Thyroid Function Tests: No results for input(s): TSH, T4TOTAL, FREET4, T3FREE, THYROIDAB in the last 72 hours. Anemia Panel: Recent Labs    03/15/19 1053 03/16/19 0615  FERRITIN 1,828* 2,070*   Sepsis Labs: Recent Labs  Lab 03/15/19 1053 03/15/19 1317  PROCALCITON 0.80  --   LATICACIDVEN 1.8 1.1    Recent Results (from the past 240 hour(s))  Blood Culture (routine x 2)     Status: None (Preliminary result)   Collection Time:  03/15/19 10:53 AM   Specimen: BLOOD  Result Value Ref Range Status   Specimen Description BLOOD BLOOD RIGHT FOREARM  Final   Special Requests   Final    BOTTLES DRAWN AEROBIC AND ANAEROBIC Blood Culture adequate volume   Culture   Final    NO GROWTH < 24 HOURS Performed at [facility], [address] [place], [place] [address]    Report Status PENDING  Incomplete  Blood Culture (routine x 2)     Status: None (Preliminary result)   Collection Time: 03/15/19 10:54 AM   Specimen: BLOOD  Result Value Ref Range Status   Specimen Description BLOOD RIGHT WRIST  Final   Special Requests   Final    BOTTLES DRAWN AEROBIC AND ANAEROBIC Blood Culture adequate volume   Culture   Final    NO GROWTH < 24 HOURS Performed at [facility], [address], [place], [place] [address]  Report Status PENDING  Incomplete         Radiology Studies: Dg Chest Port 1 View  Result Date: 03/15/2019 CLINICAL DATA:  Shortness of breath and weakness. Fever. COVID-19 positive. EXAM: PORTABLE CHEST 1 VIEW COMPARISON:  Chest x-ray dated 11/10/2018 and CT scan dated 03/02/2019 FINDINGS: The patient has developed extensive faint bilateral pulmonary infiltrates. Chronic cardiomegaly. Pulmonary vascularity is at the upper limits of normal. No effusions. No acute bone abnormality. CABG. IMPRESSION: 1. New extensive faint bilateral pulmonary infiltrates since the prior CT scan. 2. Chronic cardiomegaly. 3. Pulmonary vascularity at upper limits of normal. Electronically Signed   By: Lorriane Shire M.D.   On: 03/15/2019 11:15        Scheduled Meds: . allopurinol  300 mg Oral Daily  . amiodarone  200 mg Oral Daily  . aspirin EC  81 mg Oral Daily  . cinacalcet  30 mg Oral QODAY  . guaiFENesin  600 mg Oral BID   And  . dextromethorphan  30 mg Oral BID  . [START ON 03/17/2019] epoetin (EPOGEN/PROCRIT) injection  4,000 Units Intravenous Once  . feeding supplement (NEPRO CARB STEADY)  237 mL  Oral TID BM  . ipratropium  2 puff Inhalation Q4H  . methylPREDNISolone (SOLU-MEDROL) injection  40 mg Intravenous Q12H  . metoprolol tartrate  12.5 mg Oral BID  . multivitamin  1 tablet Oral Daily  . pantoprazole  40 mg Oral Daily  . pravastatin  20 mg Oral QPM  . sodium zirconium cyclosilicate  10 g Oral Once  . vitamin C  500 mg Oral Daily  . Warfarin - Pharmacist Dosing Inpatient   Does not apply q1800  . zinc sulfate  220 mg Oral Daily   Continuous Infusions: . sodium chloride Stopped (03/16/19 1226)  . remdesivir 100 mg in NS 100 mL Stopped (03/16/19 1046)     LOS: 1 day    Time spent: 67mins    Kathie Dike, MD [facility]   If 7PM-7AM, please contact night-coverage [email or web address]  03/16/2019, 7:22 PM

## 2019-03-16 NOTE — Progress Notes (Signed)
Patient has refused cpap therapy

## 2019-03-16 NOTE — Progress Notes (Signed)
ANTICOAGULATION CONSULT NOTE - Initial Consult  Pharmacy Consult for warfarin Indication: atrial fibrillation and hx mech AVR  Allergies  Allergen Reactions  . Statins     Other reaction(s): Arthralgias (intolerance), Myalgias (intolerance)  Pt taking pravastatin   . Sertraline Rash    Patient Measurements: Height: 5\' 10"  (177.8 cm) Weight: 207 lb 3.7 oz (94 kg) IBW/kg (Calculated) : 73  Vital Signs: Temp: 97.9 F (36.6 C) (12/09 0405) Temp Source: Oral (12/09 0405) BP: 148/89 (12/09 0405) Pulse Rate: 55 (12/09 0405)  Labs: Recent Labs    03/15/19 1053 03/15/19 1448 03/15/19 1724 03/16/19 0615  HGB 11.2*  --   --  10.9*  HCT 35.6*  --   --  33.7*  PLT 124*  --   --  121*  LABPROT  --  38.4*  --  40.4*  INR  --  3.9*  --  4.2*  CREATININE 14.36*  --   --  10.25*  TROPONINIHS  --  380* 503*  --     Estimated Creatinine Clearance: 8.6 mL/min (A) (by C-G formula based on SCr of 10.25 mg/dL (H)).   Medical History: Past Medical History:  Diagnosis Date  . 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 Aortic stenosis    a. 2014 s/p mech AVR ([facility])-->chronic coumadin; b. 08/2018 Echo: Mild AS/AI. Nl fxn/gradient.  . Arthritis   . Atrial fibrillation ([place])   . CAD (coronary artery disease)    a. 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 02/2018 MV [facility]): large inf, infsept, inflat infarct w/ min rev, EF 30%; c. 06/2018 Cath: LM min irregs, LAD 60p, 95/30/7m, LCX min irregs, RCA 40p, 47m, 90d, RPAV 90, RPDA 95ost; c. 09/2018 CABGx3 (LIMA->LAD, VG->D1, VG->PDA).  . Chronic anticoagulation 06/2012   a. Coumadin in the setting of mech AVR and afib.  Marland Kitchen CPAP (continuous positive airway pressure) dependence   . Dialysis patient [facility]) 04/07/2006   Patient started HD around 1996,  had 1st transplant LLQ around 1998 until 2007 when they found renal cell cancer in transplant and both native kidneys, all were removed >  went back on HD until 2nd transplant RLQ in 2014 which lasted 3 yrs; it was embolized for suspected acute rejection. Is currently on HD MWF in [place] w/ [facility] nephrology.  . ESRD (end stage renal disease) ([place])    a. 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 1999 s/p Kidney transplant; c. 2014 Bilat nephrectomy (transplanted kidney resected) in the setting of renal cell carcinoma; d. PD until 11/2017-->HD.  Marland Kitchen Heart murmur   . HFrEF (heart failure with reduced ejection fraction) ([place])    a. 07/2016 Echo: EF 55-60%; b. 02/2018 MV: EF 30%; c. 04/2018 Echo: EF 35%; d. 08/2018 Echo: EF 30-35%. Glob HK w/o rwma. Mildly reduced RV fxn. Mod to sev dil LA. Nl AoV fxn.  Marland Kitchen Hx of colonoscopy    2009 by Dr. Laural Golden. Normal except for small submucosal lipoma. No evidence of polyps or colitis.  . Hyperlipidemia   . Hypertension   . Hypogonadism in male 07/25/2015  . Ischemic cardiomyopathy    a. 07/2016 Echo: EF 55-60%; b. 02/2018 MV: EF 30%; c. 04/2018 Echo: EF 35%; d. 08/2018 Echo: EF 30-35%.  . OSA (obstructive sleep apnea)    No CPAP  . Permanent atrial fibrillation (HCC)    a. CHA2DS2VASc = 3-->Coumadin (also mech AVR); b. 06/2018  Amio started 2/2 elevated rates.  . Renal cell cancer ([place])   . Renal cell carcinoma ([place]) 08/04/2016      Assessment: 63 yo male on warfarin PTA for AFib and mechanical AVR (2014). Pharmacy consulted to continue dosing warfarin. Per med rec pt taking warfarin 1 mg all days except 0.5 mg on Friday (med rec done by tech by calling spouse who stated dose has been changing; pt last took meds Sunday night).   12/8 INR 3.9 12/9 INR 4.3  Goal of Therapy:  INR 2.5-3.5 - confirmed INR goal with admitting MD Monitor platelets by anticoagulation protocol: Yes   Plan:  INR today is supratherapeutic. Will hold warfarin for tonight and see how it trends. F/u INR in AM. CBC at least every three days per policy.   Pharmacy will continue to follow.   Jamelyn Bovard A Gina Leblond 03/16/2019,9:46  AM

## 2019-03-17 ENCOUNTER — Telehealth: Payer: Medicare Other | Admitting: Physician Assistant

## 2019-03-17 ENCOUNTER — Telehealth: Payer: Self-pay | Admitting: Cardiovascular Disease

## 2019-03-17 LAB — PROTIME-INR
INR: 3.2 — ABNORMAL HIGH (ref 0.8–1.2)
Prothrombin Time: 32.5 seconds — ABNORMAL HIGH (ref 11.4–15.2)

## 2019-03-17 LAB — CBC WITH DIFFERENTIAL/PLATELET
Abs Immature Granulocytes: 0.04 10*3/uL (ref 0.00–0.07)
Basophils Absolute: 0 10*3/uL (ref 0.0–0.1)
Basophils Relative: 0 %
Eosinophils Absolute: 0 10*3/uL (ref 0.0–0.5)
Eosinophils Relative: 0 %
HCT: 33.3 % — ABNORMAL LOW (ref 39.0–52.0)
Hemoglobin: 10.8 g/dL — ABNORMAL LOW (ref 13.0–17.0)
Immature Granulocytes: 0 %
Lymphocytes Relative: 7 %
Lymphs Abs: 0.7 10*3/uL (ref 0.7–4.0)
MCH: 34.7 pg — ABNORMAL HIGH (ref 26.0–34.0)
MCHC: 32.4 g/dL (ref 30.0–36.0)
MCV: 107.1 fL — ABNORMAL HIGH (ref 80.0–100.0)
Monocytes Absolute: 0.3 10*3/uL (ref 0.1–1.0)
Monocytes Relative: 3 %
Neutro Abs: 8.3 10*3/uL — ABNORMAL HIGH (ref 1.7–7.7)
Neutrophils Relative %: 90 %
Platelets: 121 10*3/uL — ABNORMAL LOW (ref 150–400)
RBC: 3.11 MIL/uL — ABNORMAL LOW (ref 4.22–5.81)
RDW: 14.5 % (ref 11.5–15.5)
WBC: 9.2 10*3/uL (ref 4.0–10.5)
nRBC: 0 % (ref 0.0–0.2)

## 2019-03-17 LAB — COMPREHENSIVE METABOLIC PANEL
ALT: 18 U/L (ref 0–44)
AST: 38 U/L (ref 15–41)
Albumin: 3.2 g/dL — ABNORMAL LOW (ref 3.5–5.0)
Alkaline Phosphatase: 143 U/L — ABNORMAL HIGH (ref 38–126)
Anion gap: 19 — ABNORMAL HIGH (ref 5–15)
BUN: 93 mg/dL — ABNORMAL HIGH (ref 8–23)
CO2: 27 mmol/L (ref 22–32)
Calcium: 8.2 mg/dL — ABNORMAL LOW (ref 8.9–10.3)
Chloride: 92 mmol/L — ABNORMAL LOW (ref 98–111)
Creatinine, Ser: 12.19 mg/dL — ABNORMAL HIGH (ref 0.61–1.24)
GFR calc Af Amer: 5 mL/min — ABNORMAL LOW (ref >60)
GFR calc non Af Amer: 4 mL/min — ABNORMAL LOW (ref >60)
Glucose, Bld: 163 mg/dL — ABNORMAL HIGH (ref 70–99)
Potassium: 5.5 mmol/L — ABNORMAL HIGH (ref 3.5–5.1)
Sodium: 138 mmol/L (ref 135–145)
Total Bilirubin: 1.3 mg/dL — ABNORMAL HIGH (ref 0.3–1.2)
Total Protein: 7.3 g/dL (ref 6.5–8.1)

## 2019-03-17 LAB — FERRITIN: Ferritin: 2244 ng/mL — ABNORMAL HIGH (ref 24–336)

## 2019-03-17 LAB — MAGNESIUM: Magnesium: 2.6 mg/dL — ABNORMAL HIGH (ref 1.7–2.4)

## 2019-03-17 LAB — PHOSPHORUS
Phosphorus: 6.6 mg/dL — ABNORMAL HIGH (ref 2.5–4.6)
Phosphorus: 6.1 mg/dL — ABNORMAL HIGH (ref 2.5–4.6)

## 2019-03-17 LAB — FIBRIN DERIVATIVES D-DIMER (ARMC ONLY): Fibrin derivatives D-dimer (ARMC): 847.75 ng/mL (FEU) — ABNORMAL HIGH (ref 0.00–499.00)

## 2019-03-17 LAB — C-REACTIVE PROTEIN: CRP: 17.7 mg/dL — ABNORMAL HIGH (ref <1.0)

## 2019-03-17 MED ORDER — WARFARIN SODIUM 1 MG PO TABS
1.0000 mg | ORAL_TABLET | Freq: Once | ORAL | Status: AC
  Administered 2019-03-17: 1 mg via ORAL
  Filled 2019-03-17: qty 1

## 2019-03-17 MED ORDER — HEPARIN SODIUM (PORCINE) 1000 UNIT/ML DIALYSIS
20.0000 [IU]/kg | INTRAMUSCULAR | Status: DC | PRN
  Filled 2019-03-17: qty 2

## 2019-03-17 MED ORDER — EPOETIN ALFA 4000 UNIT/ML IJ SOLN
4000.0000 [IU] | Freq: Once | INTRAMUSCULAR | Status: AC
  Administered 2019-03-17: 4000 [IU] via INTRAVENOUS
  Filled 2019-03-17: qty 1

## 2019-03-17 NOTE — Progress Notes (Signed)
Hemodialysis patient known at [facility] MWF 10:10. Patient self transports. Patient is already aware of plan to treat at [facility] in [place] upon discharge. Patient plans to transport self. Please contact me with any dialysis placement concerns.  Elvera Bicker Dialysis Coordinator [phone]

## 2019-03-17 NOTE — Progress Notes (Signed)
ANTICOAGULATION CONSULT NOTE - Initial Consult  Pharmacy Consult for warfarin Indication: atrial fibrillation and hx mech AVR  Allergies  Allergen Reactions  . Statins     Other reaction(s): Arthralgias (intolerance), Myalgias (intolerance)  Pt taking pravastatin   . Sertraline Rash    Patient Measurements: Height: 5\' 10"  (177.8 cm) Weight: 207 lb 3.7 oz (94 kg) IBW/kg (Calculated) : 73  Vital Signs: Temp: 97.9 F (36.6 C) (12/10 0504) Temp Source: Oral (12/10 0504) BP: 129/55 (12/10 0504) Pulse Rate: 91 (12/10 0504)  Labs: Recent Labs    03/15/19 1053 03/15/19 1448 03/15/19 1724 03/16/19 0615 03/17/19 0645  HGB 11.2*  --   --  10.9* 10.8*  HCT 35.6*  --   --  33.7* 33.3*  PLT 124*  --   --  121* 121*  LABPROT  --  38.4*  --  40.4* 32.5*  INR  --  3.9*  --  4.2* 3.2*  CREATININE 14.36*  --   --  10.25* 12.19*  TROPONINIHS  --  380* 503*  --   --     Estimated Creatinine Clearance: 7.2 mL/min (A) (by C-G formula based on SCr of 12.19 mg/dL (H)).   Medical History: Past Medical History:  Diagnosis Date  . 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 Aortic stenosis    a. 2014 s/p mech AVR ([facility])-->chronic coumadin; b. 08/2018 Echo: Mild AS/AI. Nl fxn/gradient.  . Arthritis   . Atrial fibrillation ([place])   . CAD (coronary artery disease)    a. 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 02/2018 MV [facility]): large inf, infsept, inflat infarct w/ min rev, EF 30%; c. 06/2018 Cath: LM min irregs, LAD 60p, 95/30/31m, LCX min irregs, RCA 40p, 66m, 90d, RPAV 90, RPDA 95ost; c. 09/2018 CABGx3 (LIMA->LAD, VG->D1, VG->PDA).  . Chronic anticoagulation 06/2012   a. Coumadin in the setting of mech AVR and afib.  Marland Kitchen CPAP (continuous positive airway pressure) dependence   . Dialysis patient [facility]) 04/07/2006   Patient started HD around 1996,  had 1st transplant LLQ around 1998 until 2007 when they found renal cell cancer in  transplant and both native kidneys, all were removed > went back on HD until 2nd transplant RLQ in 2014 which lasted 3 yrs; it was embolized for suspected acute rejection. Is currently on HD MWF in [place] w/ [facility] nephrology.  . ESRD (end stage renal disease) ([place])    a. CKD 2/2 to IgA nephropathy (dx age 20); b. 1999 s/p Kidney transplant; c. 2014 Bilat nephrectomy (transplanted kidney resected) in the setting of renal cell carcinoma; d. PD until 11/2017-->HD.  Marland Kitchen Heart murmur   . HFrEF (heart failure with reduced ejection fraction) ([place])    a. 07/2016 Echo: EF 55-60%; b. 02/2018 MV: EF 30%; c. 04/2018 Echo: EF 35%; d. 08/2018 Echo: EF 30-35%. Glob HK w/o rwma. Mildly reduced RV fxn. Mod to sev dil LA. Nl AoV fxn.  Marland Kitchen Hx of colonoscopy    2009 by Dr. Laural Golden. Normal except for small submucosal lipoma. No evidence of polyps or colitis.  . Hyperlipidemia   . Hypertension   . Hypogonadism in male 07/25/2015  . Ischemic cardiomyopathy    a. 07/2016 Echo: EF 55-60%; b. 02/2018 MV: EF 30%; c. 04/2018 Echo: EF 35%; d. 08/2018 Echo: EF 30-35%.  . OSA (obstructive sleep apnea)    No CPAP  . Permanent atrial fibrillation ([place])  a. CHA2DS2VASc = 3-->Coumadin (also mech AVR); b. 06/2018 Amio started 2/2 elevated rates.  . Renal cell cancer ([place])   . Renal cell carcinoma ([place]) 08/04/2016      Assessment: 63 yo male on warfarin PTA for AFib and mechanical AVR (2014). Pharmacy consulted to continue dosing warfarin. Per med rec pt taking warfarin 1 mg all days except 0.5 mg on Friday (med rec done by tech by calling spouse who stated dose has been changing; pt last took meds Sunday night).   12/8 INR 3.9 12/9 INR 4.3 12/10 INR 3.3  Goal of Therapy:  INR 2.5-3.5 - confirmed INR goal with admitting MD Monitor platelets by anticoagulation protocol: Yes   Plan:  INR today is currently therapeutic. Will order patient's home dose of Warfarin 1mg  to be given tonight.  Patient also on amiodarone and  allopurinol, which can increase INR levels. F/u INR in AM. CBC at least every three days per policy.   Pharmacy will continue to follow.   Cynethia Schindler A Jaryan Chicoine 03/17/2019,10:17 AM

## 2019-03-17 NOTE — Progress Notes (Signed)
PROGRESS NOTE    Jeremy [name]  XNA:[id number] DOB: 14-Sep-1955 DOA: 03/15/2019 PCP: Leone Haven, MD    Brief Narrative:  63 year old male with history of end-stage renal disease, mechanical aortic valve replacement on Coumadin, paroxysmal atrial fibrillation, chronic systolic heart failure, admitted to the hospital with cough, shortness of breath and fever.  Found to have COVID-19 positive.  Chest x-ray indicated bilateral infiltrates.  CRP markedly elevated over 20.  Started on intravenous steroids and remdesivir.   Assessment & Plan:   Principal Problem:   Acute respiratory disease due to COVID-19 virus Active Problems:   S/P AVR (aortic valve replacement)   ESRD on dialysis (HCC)   CAD (coronary artery disease)   Hyperlipidemia   Essential hypertension   OSA (obstructive sleep apnea)   Gout   Anemia in chronic kidney disease   Atrial fibrillation with RVR (HCC)   Chronic systolic (congestive) heart failure (HCC)   S/P CABG x 3   1. Pneumonia secondary to COVID-19 virus.  Patient noted to have bilateral infiltrates on chest x-ray.  CRP was markedly elevated at greater than 20.  Ferritin also significantly elevated.  Currently, patient is on remdesivir (day 3/5) and intravenous steroids.  Currently, patient is on room air.  If his respiratory status begins to decline, he will need to receive a dose of Actemra.  With his markedly elevated CRP, he would be considered higher risk for decompensation and will need continued monitoring in the hospital for now.  Continue to monitor for now.  He has been encouraged to lay in a prone position for as long as he will tolerate. 2. Status post aortic valve replacement.  Continue anticoagulation with Coumadin 3. Coagulopathy secondary to Coumadin.  No signs of bleeding at this time.  Pharmacy to adjust Coumadin. 4. End-stage renal disease on hemodialysis.  Nephrology following, appreciate assistance. 5. Atrial fibrillation with rapid  ventricular response.  Heart rate was in the 110s to 120s, but has since improved and is now stable.  Continue on metoprolol and amiodarone.  He is anticoagulated with Coumadin. 6. Coronary artery disease.  Status post CABG.  No complaints of chest pain at time.  Continue aspirin and statin. 7. Hyperlipidemia.  Continue statin 8. Hypertension.  Continue on home dose of metoprolol.  Blood pressure currently stable. 9. Obstructive sleep apnea.  Would avoid CPAP at this time due to his Covid infection and risk for aerosolization. 10. Chronic systolic congestive heart failure.  Ejection fraction 45%.  Volume management with dialysis.   DVT prophylaxis: Warfarin Code Status: Full code Family Communication: None present Disposition Plan: Discharge home once remdesivir course is complete   Consultants:     Procedures:     Antimicrobials:    Remdesivir 12/8>   Subjective: Continues to have cough.  Denies any shortness of breath.  No chest pain.  Objective: Vitals:   03/17/19 1315 03/17/19 1320 03/17/19 1330 03/17/19 1427  BP: 100/65 99/65 99/68  120/81  Pulse: 80 90 85 (!) 109  Resp: 20 20 20    Temp:   98.2 F (36.8 C)   TempSrc:   Oral   SpO2: 100% 100% 100%   Weight:      Height:        Intake/Output Summary (Last 24 hours) at 03/17/2019 1641 Last data filed at 03/17/2019 1330 Gross per 24 hour  Intake 143.86 ml  Output 2000 ml  Net -1856.14 ml   Filed Weights   03/15/19 1047 03/15/19 1820 03/17/19 1000  Weight: 87 kg 94 kg 94 kg    Examination:  General exam: Appears calm and comfortable  Respiratory system: Clear to auscultation. Respiratory effort normal. Cardiovascular system: S1 & S2 heard, RRR. No JVD, murmurs, rubs, gallops or clicks. No pedal edema. Gastrointestinal system: Abdomen is nondistended, soft and nontender. No organomegaly or masses felt. Normal bowel sounds heard. Central nervous system: Alert and oriented. No focal neurological  deficits. Extremities: Symmetric 5 x 5 power. Skin: No rashes, lesions or ulcers Psychiatry: Judgement and insight appear normal. Mood & affect appropriate.     Data Reviewed: I have personally reviewed following labs and imaging studies  CBC: Recent Labs  Lab 03/15/19 1053 03/16/19 0615 03/17/19 0645  WBC 7.3 5.8 9.2  NEUTROABS 5.4 5.0 8.3*  HGB 11.2* 10.9* 10.8*  HCT 35.6* 33.7* 33.3*  MCV 107.2* 103.1* 107.1*  PLT 124* 121* 676*   Basic Metabolic Panel: Recent Labs  Lab 03/15/19 1053 03/16/19 0615 03/17/19 0645  NA 139 139 138  K 6.0* 5.5* 5.5*  CL 95* 93* 92*  CO2 21* 27 27  GLUCOSE 106* 160* 163*  BUN 76* 57* 93*  CREATININE 14.36* 10.25* 12.19*  CALCIUM 8.4* 8.0* 8.2*  MG  --  2.5* 2.6*  PHOS  --   --  6.1*  6.6*   GFR: Estimated Creatinine Clearance: 7.2 mL/min (A) (by C-G formula based on SCr of 12.19 mg/dL (H)). Liver Function Tests: Recent Labs  Lab 03/15/19 1053 03/16/19 0615 03/17/19 0645  AST 29 35 38  ALT 21 20 18   ALKPHOS 167* 154* 143*  BILITOT 1.4* 1.1 1.3*  PROT 8.0 7.7 7.3  ALBUMIN 3.5 3.2* 3.2*   No results for input(s): LIPASE, AMYLASE in the last 168 hours. No results for input(s): AMMONIA in the last 168 hours. Coagulation Profile: Recent Labs  Lab 03/15/19 1448 03/16/19 0615 03/17/19 0645  INR 3.9* 4.2* 3.2*   Cardiac Enzymes: No results for input(s): CKTOTAL, CKMB, CKMBINDEX, TROPONINI in the last 168 hours. BNP (last 3 results) No results for input(s): PROBNP in the last 8760 hours. HbA1C: No results for input(s): HGBA1C in the last 72 hours. CBG: Recent Labs  Lab 03/15/19 1357  GLUCAP 86   Lipid Profile: Recent Labs    03/15/19 1053  TRIG 179*   Thyroid Function Tests: No results for input(s): TSH, T4TOTAL, FREET4, T3FREE, THYROIDAB in the last 72 hours. Anemia Panel: Recent Labs    03/16/19 0615 03/17/19 0645  FERRITIN 2,070* 2,244*   Sepsis Labs: Recent Labs  Lab 03/15/19 1053 03/15/19 1317   PROCALCITON 0.80  --   LATICACIDVEN 1.8 1.1    Recent Results (from the past 240 hour(s))  Blood Culture (routine x 2)     Status: None (Preliminary result)   Collection Time: 03/15/19 10:53 AM   Specimen: BLOOD  Result Value Ref Range Status   Specimen Description BLOOD BLOOD RIGHT FOREARM  Final   Special Requests   Final    BOTTLES DRAWN AEROBIC AND ANAEROBIC Blood Culture adequate volume   Culture   Final    NO GROWTH 2 DAYS Performed at [facility], [address] [place], [place] [address]    Report Status PENDING  Incomplete  Blood Culture (routine x 2)     Status: None (Preliminary result)   Collection Time: 03/15/19 10:54 AM   Specimen: BLOOD  Result Value Ref Range Status   Specimen Description BLOOD RIGHT WRIST  Final   Special Requests   Final  BOTTLES DRAWN AEROBIC AND ANAEROBIC Blood Culture adequate volume   Culture   Final    NO GROWTH 2 DAYS Performed at [facility], [address], [place], [place] [address]    Report Status PENDING  Incomplete         Radiology Studies: No results found.      Scheduled Meds: . allopurinol  300 mg Oral Daily  . amiodarone  200 mg Oral Daily  . aspirin EC  81 mg Oral Daily  . cinacalcet  30 mg Oral QODAY  . guaiFENesin  600 mg Oral BID   And  . dextromethorphan  30 mg Oral BID  . feeding supplement (NEPRO CARB STEADY)  237 mL Oral TID BM  . ipratropium  2 puff Inhalation Q4H  . methylPREDNISolone (SOLU-MEDROL) injection  40 mg Intravenous Q12H  . metoprolol tartrate  12.5 mg Oral BID  . multivitamin  1 tablet Oral Daily  . pantoprazole  40 mg Oral Daily  . pravastatin  20 mg Oral QPM  . vitamin C  500 mg Oral Daily  . warfarin  1 mg Oral ONCE-1800  . Warfarin - Pharmacist Dosing Inpatient   Does not apply q1800  . zinc sulfate  220 mg Oral Daily   Continuous Infusions: . sodium chloride 30 mL (03/17/19 1438)  . remdesivir 100 mg in NS 100 mL 100 mg (03/17/19 1439)     LOS:  2 days    Time spent: 70mins    Kathie Dike, MD [facility]   If 7PM-7AM, please contact night-coverage [email or web address]  03/17/2019, 4:41 PM

## 2019-03-17 NOTE — Plan of Care (Signed)
  Problem: Clinical Measurements: Goal: Ability to maintain clinical measurements within normal limits will improve Outcome: Progressing Goal: Will remain free from infection Outcome: Progressing Goal: Diagnostic test results will improve Outcome: Progressing Goal: Respiratory complications will improve Outcome: Progressing Goal: Cardiovascular complication will be avoided Outcome: Progressing  Pt is currently receiving HD Tx. Stable. Pt is on RA. Reports no SOB or chest pain. BP WDL during Tx. Will Keep Monitoring

## 2019-03-17 NOTE — Progress Notes (Signed)
Pre HD Assessment   03/17/19 1010  Neurological  Level of Consciousness Alert  Orientation Level Oriented X4  Respiratory  Respiratory Pattern Regular;Unlabored  Chest Assessment Chest expansion symmetrical  Bilateral Breath Sounds Diminished  Cough Non-productive;Dry  Cardiac  Pulse Regular  Heart Sounds S1, S2  Jugular Venous Distention (JVD) Yes  ECG Monitor Yes  Cardiac Rhythm Atrial fibrillation;BBB  Antiarrhythmic device No  Vascular  R Radial Pulse +2  L Radial Pulse +2  Integumentary  Integumentary (WDL) X  Skin Color Appropriate for ethnicity  Skin Condition Dry  Skin Integrity Other (Comment) (redness on side of bilateral great toes, AV fistula LUE)  Musculoskeletal  Musculoskeletal (WDL) WDL  Gastrointestinal  Bowel Sounds Assessment Active  Last BM Date 03/17/19  GU Assessment  Genitourinary (WDL) X  Genitourinary Symptoms Anuria (HD Pt)  Psychosocial  Psychosocial (WDL) WDL

## 2019-03-17 NOTE — Progress Notes (Signed)
[place], [name] 03/17/19  Subjective:   Hospital day # 2 Presented to hospital via EMS for weakness and SOB, Fever at home 104 Covid pos Dec 6 Neuro: no c/o headaches. C/o generalized weakness-symptoms are improving Cvs:no chest pain Pulm: congestion, cough, mild SOB, COVID-19 +; on remdesivir.  Trying to lay prone.  Symptoms are improving Gi: Appetite has improved.  Able to eat a little better Potassium elevated today Plan for dialysis later today    Objective:  Vital signs in last 24 hours:  Temp:  [97.9 F (36.6 C)-98.3 F (36.8 C)] 98.2 F (36.8 C) (12/10 1330) Pulse Rate:  [74-109] 109 (12/10 1427) Resp:  [15-22] 20 (12/10 1330) BP: (99-173)/(55-81) 120/81 (12/10 1427) SpO2:  [100 %] 100 % (12/10 1330) Weight:  [94 kg] 94 kg (12/10 1000)  Weight change:  Filed Weights   03/15/19 1047 03/15/19 1820 03/17/19 1000  Weight: 87 kg 94 kg 94 kg    Intake/Output:    Intake/Output Summary (Last 24 hours) at 03/17/2019 1721 Last data filed at 03/17/2019 1330 Gross per 24 hour  Intake 143.86 ml  Output 2000 ml  Net -1856.14 ml     Physical Exam: Deferred due to Covid positive status, to preserve PPE and minimize exposure.   Basic Metabolic Panel:  Recent Labs  Lab 03/15/19 1053 03/16/19 0615 03/17/19 0645  NA 139 139 138  K 6.0* 5.5* 5.5*  CL 95* 93* 92*  CO2 21* 27 27  GLUCOSE 106* 160* 163*  BUN 76* 57* 93*  CREATININE 14.36* 10.25* 12.19*  CALCIUM 8.4* 8.0* 8.2*  MG  --  2.5* 2.6*  PHOS  --   --  6.1*  6.6*     CBC: Recent Labs  Lab 03/15/19 1053 03/16/19 0615 03/17/19 0645  WBC 7.3 5.8 9.2  NEUTROABS 5.4 5.0 8.3*  HGB 11.2* 10.9* 10.8*  HCT 35.6* 33.7* 33.3*  MCV 107.2* 103.1* 107.1*  PLT 124* 121* 121*      Lab Results  Component Value Date   HEPBSAG NON REACTIVE 03/15/2019      Microbiology:  Recent Results (from the past 240 hour(s))  Blood Culture (routine x 2)     Status: None  (Preliminary result)   Collection Time: 03/15/19 10:53 AM   Specimen: BLOOD  Result Value Ref Range Status   Specimen Description BLOOD BLOOD RIGHT FOREARM  Final   Special Requests   Final    BOTTLES DRAWN AEROBIC AND ANAEROBIC Blood Culture adequate volume   Culture   Final    NO GROWTH 2 DAYS Performed at [facility], [address] [place], [place] [address]    Report Status PENDING  Incomplete  Blood Culture (routine x 2)     Status: None (Preliminary result)   Collection Time: 03/15/19 10:54 AM   Specimen: BLOOD  Result Value Ref Range Status   Specimen Description BLOOD RIGHT WRIST  Final   Special Requests   Final    BOTTLES DRAWN AEROBIC AND ANAEROBIC Blood Culture adequate volume   Culture   Final    NO GROWTH 2 DAYS Performed at [facility], [address] [place], [place] [address]    Report Status PENDING  Incomplete    Coagulation Studies: Recent Labs    03/15/19 1448 03/16/19 0615 03/17/19 0645  LABPROT 38.4* 40.4* 32.5*  INR 3.9* 4.2* 3.2*    Urinalysis: No results for input(s): COLORURINE, LABSPEC, PHURINE, GLUCOSEU, [place], BILIRUBINUR, KETONESUR, PROTEINUR, UROBILINOGEN, NITRITE, LEUKOCYTESUR  in the last 72 hours.  Invalid input(s): APPERANCEUR    Imaging: No results found.   Medications:       Assessment/ Plan:  63 y.o. male with end stage renal disease on hemodialysis secondary to IgA nephropathy, status post renal transplant x2 (1996->removed due to RCC, 2014->2017), restarted on hemodialysis 11/2015, hypertension, gout, aortic valve replacement on warfarin, Atrial fibrillation, hyperlipidemia, coronary artery disease, congestive heart failure, obstructive sleep apnea  HEP B IMMUNE B 487 ON 12/08/2018  [facility] rD/ 225 MIN// EDW 87 kg//MWF  1.  End-stage renal disease -Hemodialysis planned for today, Will reevaluate for dialysis tomorrow versus Saturday  2.  Hyperkalemia -Expected to correct with  hemodialysis - given 1 dose of lokelma  3. COVID + pneumonia Management as per hospitalist team Remdesivir, IV Solu-Medrol 40 mg twice a day, vitamin C, zinc, Prone positioning  4. Anemia of CKD  Lab Results  Component Value Date   HGB 10.8 (L) 03/17/2019  Epogen with hemodialysis treatment.  5.  Secondary hyperparathyroidism -Continued on home dose of Sensipar Restart Renvela with meals     LOS: 2 Genavive Kubicki 12/10/20205:21 PM  [place], [place]  Note: This note was prepared with Dragon dictation. Any transcription errors are unintentional

## 2019-03-17 NOTE — Progress Notes (Signed)
Pre HD Tx Note    03/17/19 1000  Hand-Off documentation  Report given to (Full Name) Newt Minion RN   Report received from (Full Name) Arlis Porta RN  Vital Signs  Temp 98.3 F (36.8 C)  Temp Source Oral  Pulse Rate 78  Pulse Rate Source Monitor  Resp 20  BP (!) 167/76  BP Location Right Arm  BP Method Automatic  Patient Position (if appropriate) Lying  Oxygen Therapy  SpO2 100 %  O2 Device Room Air  Pain Assessment  Pain Scale 0-10  Pain Score 0  Dialysis Weight  Weight 94 kg  Type of Weight Pre-Dialysis  Time-Out for Hemodialysis  What Procedure? HD  Pt Identifiers(min of two) First/Last Name;MRN/Account#  Correct Site? Yes  Correct Side? Yes  Correct Procedure? Yes  Rad Studies Available? Yes  Safety Precautions Reviewed? Yes  Engineer, civil (consulting) Number [place] Number  [phone])  UF/Alarm Test Passed  Conductivity: Meter 13.8  Conductivity: Machine  13.9  pH 7.6  Reverse Osmosis  (Protable RO )  Normal Saline Lot Number [id number]  Dialyzer Lot Number [id number]  Disposable Set Lot Number [id number]  Machine Temperature 98.6 F (37 C)  Musician and Audible Yes  Blood Lines Intact and Secured Yes  Pre Treatment Patient Checks  Vascular access used during treatment Fistula  HD catheter dressing before treatment WDL  Patient is receiving dialysis in a chair  (no)  Hepatitis B Surface Antigen Results Negative  Date Hepatitis B Surface Antigen Drawn 03/15/19  Hepatitis B Surface Antibody  (<10)  Date Hepatitis B Surface Antibody Drawn 03/15/19  Hemodialysis Consent Verified Yes  Hemodialysis Standing Orders Initiated Yes  ECG (Telemetry) Monitor On Yes  Prime Ordered Normal Saline  Length of  DialysisTreatment -hour(s) 3 Hour(s)  Dialysis Treatment Comments  (Na140)  Dialyzer Elisio 17H NR  Dialysate 2K;2.5 Ca  Dialysis Anticoagulant Heparin  Dialysate Flow Ordered 600  Blood Flow Rate Ordered 400 mL/min  Ultrafiltration Goal 1.5 Liters (as  per Dr Candiss Norse Verbal Order)  Pre Treatment Labs Phosphorus  Dialysis Blood Pressure Support Ordered Albumin  Education / Care Plan  Dialysis Education Provided Yes  Documented Education in Care Plan Yes  Fistula / Graft Left Forearm Arteriovenous fistula  No Placement Date or Time found.   Placed prior to admission: Yes  Orientation: Left  Access Location: Forearm  Access Type: Arteriovenous fistula  Site Condition No complications  Fistula / Graft Assessment Present;Thrill;Bruit  Status Accessed  Needle Size 15  Drainage Description None

## 2019-03-17 NOTE — Progress Notes (Signed)
HD Tx Completed   03/17/19 1320  Vital Signs  Pulse Rate 90  Pulse Rate Source Monitor  Resp 20  BP 99/65  BP Location Right Arm  BP Method Automatic  Patient Position (if appropriate) Lying  Oxygen Therapy  SpO2 100 %  O2 Device Room Air  Pain Assessment  Pain Scale 0-10  Pain Score 0  During Hemodialysis Assessment  Blood Flow Rate (mL/min) 400 mL/min  Arterial Pressure (mmHg) -180 mmHg  Venous Pressure (mmHg) 180 mmHg  Transmembrane Pressure (mmHg) 80 mmHg  Ultrafiltration Rate (mL/min) 840 mL/min  Dialysate Flow Rate (mL/min) 600 ml/min  Conductivity: Machine  13.9  HD Safety Checks Performed Yes  Intra-Hemodialysis Comments Tx completed

## 2019-03-17 NOTE — Telephone Encounter (Signed)
Patient currently admitted call to reschedule appt when discharged

## 2019-03-17 NOTE — Progress Notes (Signed)
Post HD Tx Note  Pt tolerated well the HD Tx. Pt reported sweaty body I checked his temp. It was 98.0. Reported to primary RN for follow up. Pt reported breathing better after tx. He reported no chest pain or SOB. As per Nephrologist;Dr Candiss Norse verbal order UF was changed to 2555m. Prescribed UF was met Pt Tx run for 3hrs on 2K 2.5Ca and received prescribed EPO   03/17/19 1330  Hand-Off documentation  Report given to (Full Name) SArlis PortaRN  Report received from (Full Name) DNewt MinionRN  Vital Signs  Temp 98.2 F (36.8 C)  Temp Source Oral  Pulse Rate 85  Pulse Rate Source Monitor  Resp 20  BP 99/68  BP Location Right Arm  BP Method Automatic  Patient Position (if appropriate) Lying  Oxygen Therapy  SpO2 100 %  O2 Device Room Air  Pain Assessment  Pain Scale 0-10  Pain Score 0  Post-Hemodialysis Assessment  Rinseback Volume (mL) 250 mL  KECN 68.8 V  Dialyzer Clearance Lightly streaked  Duration of HD Treatment -hour(s) 3 hour(s)  Hemodialysis Intake (mL) 500 mL  UF Total -Machine (mL) 2500 mL  Net UF (mL) 2000 mL  Tolerated HD Treatment Yes  AVG/AVF Arterial Site Held (minutes) 11 minutes  AVG/AVF Venous Site Held (minutes) 8 minutes  Fistula / Graft Left Forearm Arteriovenous fistula  No Placement Date or Time found.   Placed prior to admission: Yes  Orientation: Left  Access Location: Forearm  Access Type: Arteriovenous fistula  Site Condition No complications  Fistula / Graft Assessment Present;Thrill;Bruit  Status Deaccessed  Drainage Description None

## 2019-03-17 NOTE — Progress Notes (Signed)
Pot HD tx assessment   03/17/19 1309  Neurological  Level of Consciousness Alert  Orientation Level Oriented X4  Respiratory  Respiratory Pattern Regular;Unlabored  Chest Assessment Chest expansion symmetrical  Bilateral Breath Sounds Clear;Diminished  Cough Non-productive;Strong  Cardiac  Pulse Regular  Heart Sounds S1, S2  Jugular Venous Distention (JVD) Yes  ECG Monitor Yes  Cardiac Rhythm Atrial fibrillation;BBB  Antiarrhythmic device No  Vascular  R Radial Pulse +2  L Radial Pulse +2  Integumentary  Integumentary (WDL) X  Skin Color Appropriate for ethnicity  Skin Condition Dry  Skin Integrity Other (Comment) (redness on side of bilateral great toes, AV fistula LUE)  Musculoskeletal  Musculoskeletal (WDL) WDL  Gastrointestinal  Bowel Sounds Assessment Active  GU Assessment  Genitourinary (WDL) X  Genitourinary Symptoms Anuria (HD Pt)  Psychosocial  Psychosocial (WDL) WDL

## 2019-03-18 ENCOUNTER — Ambulatory Visit: Payer: Medicare Other | Admitting: Internal Medicine

## 2019-03-18 LAB — COMPREHENSIVE METABOLIC PANEL
ALT: 20 U/L (ref 0–44)
AST: 36 U/L (ref 15–41)
Albumin: 3 g/dL — ABNORMAL LOW (ref 3.5–5.0)
Alkaline Phosphatase: 140 U/L — ABNORMAL HIGH (ref 38–126)
Anion gap: 20 — ABNORMAL HIGH (ref 5–15)
BUN: 78 mg/dL — ABNORMAL HIGH (ref 8–23)
CO2: 27 mmol/L (ref 22–32)
Calcium: 7.8 mg/dL — ABNORMAL LOW (ref 8.9–10.3)
Chloride: 92 mmol/L — ABNORMAL LOW (ref 98–111)
Creatinine, Ser: 9.23 mg/dL — ABNORMAL HIGH (ref 0.61–1.24)
GFR calc Af Amer: 6 mL/min — ABNORMAL LOW (ref >60)
GFR calc non Af Amer: 5 mL/min — ABNORMAL LOW (ref >60)
Glucose, Bld: 147 mg/dL — ABNORMAL HIGH (ref 70–99)
Potassium: 4.5 mmol/L (ref 3.5–5.1)
Sodium: 139 mmol/L (ref 135–145)
Total Bilirubin: 1.1 mg/dL (ref 0.3–1.2)
Total Protein: 7.2 g/dL (ref 6.5–8.1)

## 2019-03-18 LAB — C-REACTIVE PROTEIN: CRP: 13.4 mg/dL — ABNORMAL HIGH (ref <1.0)

## 2019-03-18 LAB — PROTIME-INR
INR: 2.4 — ABNORMAL HIGH (ref 0.8–1.2)
Prothrombin Time: 26.2 seconds — ABNORMAL HIGH (ref 11.4–15.2)

## 2019-03-18 LAB — FERRITIN: Ferritin: 1993 ng/mL — ABNORMAL HIGH (ref 24–336)

## 2019-03-18 LAB — FIBRIN DERIVATIVES D-DIMER (ARMC ONLY): Fibrin derivatives D-dimer (ARMC): 728.48 ng/mL (FEU) — ABNORMAL HIGH (ref 0.00–499.00)

## 2019-03-18 MED ORDER — WARFARIN SODIUM 1 MG PO TABS
1.5000 mg | ORAL_TABLET | Freq: Once | ORAL | Status: AC
  Administered 2019-03-18: 1.5 mg via ORAL
  Filled 2019-03-18: qty 1

## 2019-03-18 MED ORDER — SEVELAMER CARBONATE 800 MG PO TABS
1600.0000 mg | ORAL_TABLET | Freq: Three times a day (TID) | ORAL | Status: DC
  Administered 2019-03-18 – 2019-03-19 (×2): 1600 mg via ORAL
  Filled 2019-03-18 (×2): qty 2

## 2019-03-18 NOTE — Progress Notes (Signed)
ANTICOAGULATION CONSULT NOTE - Initial Consult  Pharmacy Consult for warfarin Indication: atrial fibrillation and hx mech AVR  Allergies  Allergen Reactions  . Statins     Other reaction(s): Arthralgias (intolerance), Myalgias (intolerance)  Pt taking pravastatin   . Sertraline Rash    Patient Measurements: Height: 5\' 10"  (177.8 cm) Weight: 207 lb 3.7 oz (94 kg) IBW/kg (Calculated) : 73  Vital Signs: Temp: 98.4 F (36.9 C) (12/11 0553) Temp Source: Oral (12/11 0553) BP: 122/69 (12/11 0553) Pulse Rate: 57 (12/11 0553)  Labs: Recent Labs    03/15/19 1053 03/15/19 1448 03/15/19 1724 03/16/19 0615 03/17/19 0645 03/18/19 0511  HGB 11.2*  --   --  10.9* 10.8*  --   HCT 35.6*  --   --  33.7* 33.3*  --   PLT 124*  --   --  121* 121*  --   LABPROT  --  38.4*  --  40.4* 32.5* 26.2*  INR  --  3.9*  --  4.2* 3.2* 2.4*  CREATININE 14.36*  --   --  10.25* 12.19* 9.23*  TROPONINIHS  --  380* 503*  --   --   --     Estimated Creatinine Clearance: 9.6 mL/min (A) (by C-G formula based on SCr of 9.23 mg/dL (H)).   Medical History: Past Medical History:  Diagnosis Date  . 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 Aortic stenosis    a. 2014 s/p mech AVR ([facility])-->chronic coumadin; b. 08/2018 Echo: Mild AS/AI. Nl fxn/gradient.  . Arthritis   . Atrial fibrillation ([place])   . CAD (coronary artery disease)    a. 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 02/2018 MV [facility]): large inf, infsept, inflat infarct w/ min rev, EF 30%; c. 06/2018 Cath: LM min irregs, LAD 60p, 95/30/38m, LCX min irregs, RCA 40p, 36m, 90d, RPAV 90, RPDA 95ost; c. 09/2018 CABGx3 (LIMA->LAD, VG->D1, VG->PDA).  . Chronic anticoagulation 06/2012   a. Coumadin in the setting of mech AVR and afib.  Marland Kitchen CPAP (continuous positive airway pressure) dependence   . Dialysis patient [facility]) 04/07/2006   Patient started HD around 1996,  had 1st transplant LLQ around  1998 until 2007 when they found renal cell cancer in transplant and both native kidneys, all were removed > went back on HD until 2nd transplant RLQ in 2014 which lasted 3 yrs; it was embolized for suspected acute rejection. Is currently on HD MWF in [place] w/ [facility] nephrology.  . ESRD (end stage renal disease) ([place])    a. 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 1999 s/p Kidney transplant; c. 2014 Bilat nephrectomy (transplanted kidney resected) in the setting of renal cell carcinoma; d. PD until 11/2017-->HD.  Marland Kitchen Heart murmur   . HFrEF (heart failure with reduced ejection fraction) ([place])    a. 07/2016 Echo: EF 55-60%; b. 02/2018 MV: EF 30%; c. 04/2018 Echo: EF 35%; d. 08/2018 Echo: EF 30-35%. Glob HK w/o rwma. Mildly reduced RV fxn. Mod to sev dil LA. Nl AoV fxn.  Marland Kitchen Hx of colonoscopy    2009 by Dr. Laural Golden. Normal except for small submucosal lipoma. No evidence of polyps or colitis.  . Hyperlipidemia   . Hypertension   . Hypogonadism in male 07/25/2015  . Ischemic cardiomyopathy    a. 07/2016 Echo: EF 55-60%; b. 02/2018 MV: EF 30%; c. 04/2018 Echo: EF 35%; d. 08/2018 Echo: EF 30-35%.  Marland Kitchen  OSA (obstructive sleep apnea)    No CPAP  . Permanent atrial fibrillation ([facility])    a. CHA2DS2VASc = 3-->Coumadin (also mech AVR); b. 06/2018 Amio started 2/2 elevated rates.  . Renal cell cancer ([place])   . Renal cell carcinoma ([place]) 08/04/2016      Assessment: 63 yo male on warfarin PTA for AFib and mechanical AVR (2014). Pharmacy consulted to continue dosing warfarin. Per med rec pt taking warfarin 1 mg all days except 0.5 mg on Friday (med rec done by tech by calling spouse who stated dose has been changing; pt last took meds Sunday night).   12/8 INR 3.9 12/9 INR 4.3 12/10 INR 3.3 12/11 INR 2.4  Goal of Therapy:  INR 2.5-3.5 - confirmed INR goal with admitting MD Monitor platelets by anticoagulation protocol: Yes   Plan:  INR today is slightly subtherapeutic. Will order Warfarin 1.5mg  to be given  tonight.  Patient also on amiodarone and allopurinol, which can increase INR levels. F/u INR in AM. CBC at least every three days per policy.   Pharmacy will continue to follow.   [place] Bryngelson A Leidy Massar 03/18/2019,8:32 AM

## 2019-03-18 NOTE — Progress Notes (Signed)
[place], [name] 03/18/19  Subjective:   Hospital day # 3 Presented to hospital via EMS for weakness and SOB, Fever at home 104 Covid pos Dec 6 Overall doing well Discussed with nursing staff.  No acute issues    Objective:  Vital signs in last 24 hours:  Temp:  [98.2 F (36.8 C)-98.4 F (36.9 C)] 98.4 F (36.9 C) (12/11 1036) Pulse Rate:  [57-64] 64 (12/11 1039) Resp:  [18-22] 18 (12/11 0553) BP: (122-136)/(68-70) 124/68 (12/11 1141) SpO2:  [98 %-100 %] 100 % (12/11 1036)  Weight change:  Filed Weights   03/15/19 1047 03/15/19 1820 03/17/19 1000  Weight: 87 kg 94 kg 94 kg    Intake/Output:    Intake/Output Summary (Last 24 hours) at 03/18/2019 1430 Last data filed at 03/17/2019 2015 Gross per 24 hour  Intake 193.27 ml  Output --  Net 193.27 ml     Physical Exam: Deferred due to Covid positive status, to preserve PPE and minimize exposure.   Basic Metabolic Panel:  Recent Labs  Lab 03/15/19 1053 03/16/19 0615 03/17/19 0645 03/18/19 0511  NA 139 139 138 139  K 6.0* 5.5* 5.5* 4.5  CL 95* 93* 92* 92*  CO2 21* 27 27 27   GLUCOSE 106* 160* 163* 147*  BUN 76* 57* 93* 78*  CREATININE 14.36* 10.25* 12.19* 9.23*  CALCIUM 8.4* 8.0* 8.2* 7.8*  MG  --  2.5* 2.6*  --   PHOS  --   --  6.1*  6.6*  --      CBC: Recent Labs  Lab 03/15/19 1053 03/16/19 0615 03/17/19 0645  WBC 7.3 5.8 9.2  NEUTROABS 5.4 5.0 8.3*  HGB 11.2* 10.9* 10.8*  HCT 35.6* 33.7* 33.3*  MCV 107.2* 103.1* 107.1*  PLT 124* 121* 121*      Lab Results  Component Value Date   HEPBSAG NON REACTIVE 03/15/2019      Microbiology:  Recent Results (from the past 240 hour(s))  Blood Culture (routine x 2)     Status: None (Preliminary result)   Collection Time: 03/15/19 10:53 AM   Specimen: BLOOD  Result Value Ref Range Status   Specimen Description BLOOD BLOOD RIGHT FOREARM  Final   Special Requests   Final    BOTTLES DRAWN AEROBIC AND ANAEROBIC  Blood Culture adequate volume   Culture   Final    NO GROWTH 3 DAYS Performed at [facility], [address] [place], [place] [address]    Report Status PENDING  Incomplete  Blood Culture (routine x 2)     Status: None (Preliminary result)   Collection Time: 03/15/19 10:54 AM   Specimen: BLOOD  Result Value Ref Range Status   Specimen Description BLOOD RIGHT WRIST  Final   Special Requests   Final    BOTTLES DRAWN AEROBIC AND ANAEROBIC Blood Culture adequate volume   Culture   Final    NO GROWTH 3 DAYS Performed at [facility], [address], [place], [place] [address]    Report Status PENDING  Incomplete    Coagulation Studies: Recent Labs    03/15/19 1448 03/16/19 0615 03/17/19 0645 03/18/19 0511  LABPROT 38.4* 40.4* 32.5* 26.2*  INR 3.9* 4.2* 3.2* 2.4*    Urinalysis: No results for input(s): COLORURINE, LABSPEC, PHURINE, GLUCOSEU, HGBUR, BILIRUBINUR, KETONESUR, PROTEINUR, UROBILINOGEN, NITRITE, LEUKOCYTESUR in the last 72 hours.  Invalid input(s): APPERANCEUR    Imaging: No results found.   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  End-stage renal disease -Next hemodialysis planned for Saturday as patient will moved to TTS COVID shift as outpatient -If he receives remdesivir treatment early in the day, he may be able to discharge and go straight to his outpatient center.  2.  Hyperkalemia -Corrected with hemodialysis.  Today's level 4.5  3. COVID + pneumonia Management as per hospitalist  team Remdesivir, IV Solu-Medrol 40 mg twice a day, vitamin C, zinc, Prone positioning  4. Anemia of CKD  Lab Results  Component Value Date   HGB 10.8 (L) 03/17/2019  Epogen with hemodialysis treatment.  5.  Secondary hyperparathyroidism -Continued on home dose of Sensipar Restart Renvela with meals     LOS: [place] 12/11/20202:30 PM  [place], [place]  Note: This note was prepared with Dragon dictation. Any transcription errors are unintentional

## 2019-03-18 NOTE — Progress Notes (Signed)
PROGRESS NOTE    Jeremy [name]  EXH:[id number] DOB: 1956/04/04 DOA: 03/15/2019 PCP: Leone Haven, MD    Brief Narrative:  63 year old male with history of end-stage renal disease, mechanical aortic valve replacement on Coumadin, paroxysmal atrial fibrillation, chronic systolic heart failure, admitted to the hospital with cough, shortness of breath and fever.  Found to have COVID-19 positive.  Chest x-ray indicated bilateral infiltrates.  CRP markedly elevated over 20.  Started on intravenous steroids and remdesivir.   Assessment & Plan:   Principal Problem:   Acute respiratory disease due to COVID-19 virus Active Problems:   S/P AVR (aortic valve replacement)   ESRD on dialysis (HCC)   CAD (coronary artery disease)   Hyperlipidemia   Essential hypertension   OSA (obstructive sleep apnea)   Gout   Anemia in chronic kidney disease   Atrial fibrillation with RVR (HCC)   Chronic systolic (congestive) heart failure (HCC)   S/P CABG x 3   1. Pneumonia secondary to COVID-19 virus.  Patient noted to have bilateral infiltrates on chest x-ray.  CRP was markedly elevated at greater than 20.  Ferritin also significantly elevated.  Currently, patient is on remdesivir (day 4/5) and intravenous steroids.  Currently, patient is on room air.  If his respiratory status begins to decline, he will need to receive a dose of Actemra.  With his markedly elevated CRP, he would be considered higher risk for decompensation and will need continued monitoring in the hospital for now.  Continue to monitor for now.  He has been encouraged to lay in a prone position for as long as he will tolerate.  With treatment, his inflammatory markers are trending down 2. Status post aortic valve replacement.  Continue anticoagulation with Coumadin 3. End-stage renal disease on hemodialysis.  Nephrology following, appreciate assistance. 4. Atrial fibrillation with rapid ventricular response.  Heart rate was in the 110s to  120s, but has since improved and is now stable.  Continue on metoprolol and amiodarone.  He is anticoagulated with Coumadin. 5. Coronary artery disease.  Status post CABG.  No complaints of chest pain at time.  Continue aspirin and statin. 6. Hyperlipidemia.  Continue statin 7. Hypertension.  Continue on home dose of metoprolol.  Blood pressure currently stable. 8. Obstructive sleep apnea.  Would avoid CPAP at this time due to his Covid infection and risk for aerosolization. 9. Chronic systolic congestive heart failure.  Ejection fraction 45%.  Volume management with dialysis.  Appears euvolemic   DVT prophylaxis: Warfarin Code Status: Full code Family Communication: None present Disposition Plan: Discharge home once remdesivir course is complete   Consultants:     Procedures:     Antimicrobials:    Remdesivir 12/8>   Subjective: Continues to have mild cough, but overall feeling better.  Objective: Vitals:   03/18/19 0553 03/18/19 1036 03/18/19 1039 03/18/19 1141  BP: 122/69 136/69  124/68  Pulse: (!) 57 63 64   Resp: 18     Temp: 98.4 F (36.9 C) 98.4 F (36.9 C)    TempSrc: Oral Oral    SpO2: 98% 100%    Weight:      Height:        Intake/Output Summary (Last 24 hours) at 03/18/2019 1845 Last data filed at 03/17/2019 2015 Gross per 24 hour  Intake 193.27 ml  Output --  Net 193.27 ml   Filed Weights   03/15/19 1047 03/15/19 1820 03/17/19 1000  Weight: 87 kg 94 kg 94 kg  Examination:  General exam: Appears calm and comfortable  Respiratory system: Clear to auscultation. Respiratory effort normal. Cardiovascular system: S1 & S2 heard, RRR. No JVD, murmurs, rubs, gallops or clicks. No pedal edema. Gastrointestinal system: Abdomen is nondistended, soft and nontender. No organomegaly or masses felt. Normal bowel sounds heard. Central nervous system: Alert and oriented. No focal neurological deficits. Extremities: Symmetric 5 x 5 power. Skin: No rashes,  lesions or ulcers Psychiatry: Judgement and insight appear normal. Mood & affect appropriate.     Data Reviewed: I have personally reviewed following labs and imaging studies  CBC: Recent Labs  Lab 03/15/19 1053 03/16/19 0615 03/17/19 0645  WBC 7.3 5.8 9.2  NEUTROABS 5.4 5.0 8.3*  HGB 11.2* 10.9* 10.8*  HCT 35.6* 33.7* 33.3*  MCV 107.2* 103.1* 107.1*  PLT 124* 121* 412*   Basic Metabolic Panel: Recent Labs  Lab 03/15/19 1053 03/16/19 0615 03/17/19 0645 03/18/19 0511  NA 139 139 138 139  K 6.0* 5.5* 5.5* 4.5  CL 95* 93* 92* 92*  CO2 21* 27 27 27   GLUCOSE 106* 160* 163* 147*  BUN 76* 57* 93* 78*  CREATININE 14.36* 10.25* 12.19* 9.23*  CALCIUM 8.4* 8.0* 8.2* 7.8*  MG  --  2.5* 2.6*  --   PHOS  --   --  6.1*  6.6*  --    GFR: Estimated Creatinine Clearance: 9.6 mL/min (A) (by C-G formula based on SCr of 9.23 mg/dL (H)). Liver Function Tests: Recent Labs  Lab 03/15/19 1053 03/16/19 0615 03/17/19 0645 03/18/19 0511  AST 29 35 38 36  ALT 21 20 18 20   ALKPHOS 167* 154* 143* 140*  BILITOT 1.4* 1.1 1.3* 1.1  PROT 8.0 7.7 7.3 7.2  ALBUMIN 3.5 3.2* 3.2* 3.0*   No results for input(s): LIPASE, AMYLASE in the last 168 hours. No results for input(s): AMMONIA in the last 168 hours. Coagulation Profile: Recent Labs  Lab 03/15/19 1448 03/16/19 0615 03/17/19 0645 03/18/19 0511  INR 3.9* 4.2* 3.2* 2.4*   Cardiac Enzymes: No results for input(s): CKTOTAL, CKMB, CKMBINDEX, TROPONINI in the last 168 hours. BNP (last 3 results) No results for input(s): PROBNP in the last 8760 hours. HbA1C: No results for input(s): HGBA1C in the last 72 hours. CBG: Recent Labs  Lab 03/15/19 1357  GLUCAP 86   Lipid Profile: No results for input(s): CHOL, HDL, LDLCALC, TRIG, CHOLHDL, LDLDIRECT in the last 72 hours. Thyroid Function Tests: No results for input(s): TSH, T4TOTAL, FREET4, T3FREE, THYROIDAB in the last 72 hours. Anemia Panel: Recent Labs    03/17/19 0645  03/18/19 0511  FERRITIN 2,244* 1,993*   Sepsis Labs: Recent Labs  Lab 03/15/19 1053 03/15/19 1317  PROCALCITON 0.80  --   LATICACIDVEN 1.8 1.1    Recent Results (from the past 240 hour(s))  Blood Culture (routine x 2)     Status: None (Preliminary result)   Collection Time: 03/15/19 10:53 AM   Specimen: BLOOD  Result Value Ref Range Status   Specimen Description BLOOD BLOOD RIGHT FOREARM  Final   Special Requests   Final    BOTTLES DRAWN AEROBIC AND ANAEROBIC Blood Culture adequate volume   Culture   Final    NO GROWTH 3 DAYS Performed at [facility], [address] [place], [place] [address]    Report Status PENDING  Incomplete  Blood Culture (routine x 2)     Status: None (Preliminary result)   Collection Time: 03/15/19 10:54 AM   Specimen: BLOOD  Result Value  Ref Range Status   Specimen Description BLOOD RIGHT WRIST  Final   Special Requests   Final    BOTTLES DRAWN AEROBIC AND ANAEROBIC Blood Culture adequate volume   Culture   Final    NO GROWTH 3 DAYS Performed at [facility], [address] [place],  [address]    Report Status PENDING  Incomplete         Radiology Studies: No results found.      Scheduled Meds: . allopurinol  300 mg Oral Daily  . amiodarone  200 mg Oral Daily  . aspirin EC  81 mg Oral Daily  . cinacalcet  30 mg Oral QODAY  . guaiFENesin  600 mg Oral BID   And  . dextromethorphan  30 mg Oral BID  . feeding supplement (NEPRO CARB STEADY)  237 mL Oral TID BM  . ipratropium  2 puff Inhalation Q4H  . methylPREDNISolone (SOLU-MEDROL) injection  40 mg Intravenous Q12H  . metoprolol tartrate  12.5 mg Oral BID  . multivitamin  1 tablet Oral Daily  . pantoprazole  40 mg Oral Daily  . pravastatin  20 mg Oral QPM  . sevelamer carbonate  1,600 mg Oral TID WC  . vitamin C  500 mg Oral Daily  . Warfarin - Pharmacist Dosing Inpatient   Does not apply q1800  . zinc sulfate  220 mg Oral Daily   Continuous  Infusions: . sodium chloride 10 mL (03/18/19 0848)  . remdesivir 100 mg in NS 100 mL 100 mg (03/18/19 0852)     LOS: 3 days    Time spent: 23mins    Kathie Dike, MD [facility]   If 7PM-7AM, please contact night-coverage [email or web address]  03/18/2019, 6:45 PM

## 2019-03-18 NOTE — Progress Notes (Signed)
New dialysis plan, patient will treat at home clinic on COVID shift. Chair time is TTS 3:30pm. Patient has been instructed to wait in car until nurse comes out to get him, patient will transport self to treatments. Please contact me with any questions.

## 2019-03-18 NOTE — Care Management Important Message (Signed)
Important Message  Patient Details  Name: Jeremy [name] MRN: [id number] Date of Birth: 1956/01/15   Medicare Important Message Given:  Yes  Reviewed Medicare IM verbally with patient over phone.  Cop of information emailed securely to address on chart.  Confirmed [email or web address] was correct.     Dannette Barbara 03/18/2019, 2:24 PM

## 2019-03-18 NOTE — TOC Initial Note (Signed)
Transition of Care [facility]) - Initial/Assessment Note    Patient Details  Name: Jeremy [name] MRN: [id number] Date of Birth: [birth year]  [facility]) CM/SW Contact:    Shelbie Ammons, RN Phone Number: 03/18/2019, 10:57 AM  Clinical Narrative:         Patient assessed via phone due to +Covid diagnosis.   Patient reports feeling well today, has not required any oxygen and feels that he is improving and "congestion" in chest is beginning to break up. Patient reports he lives in single level home with significant other which has also tested positive for Covid.  Patient reports that he has resources for transportation to obtain medications and any other necessities.  Discussed with patient report that he will likely need to go to a Dialysis facility in [facility] for the immediate future due to diagnosis however he reports that he has already spoken with the nurses on [place] where he normally goes and they are attempting to work on another plan due to volume of patient's needing to go to that facility.  Discussed current mobility and any needs and patient reports he has had no issues with his mobility and that he does not use any equipment in the home.   Patient reports he has not required oxygen in a while and does not forsee that being a problem.   Encouraged patient to let his nurse know if any further needs arise and how to contact TOC if needed.                 Expected Discharge Plan: Home/Self Care Barriers to Discharge: Continued Medical Work up   Patient Goals and CMS Choice Patient states their goals for this hospitalization and ongoing recovery are:: to recover enough he can get home      Expected Discharge Plan and Services Expected Discharge Plan: Home/Self Care       Living arrangements for the past 2 months: Single Family Home                                      Prior Living Arrangements/Services Living arrangements for the past 2 months:  Single Family Home   Patient language and need for interpreter reviewed:: Yes Do you feel safe going back to the place where you live?: Yes      Need for Family Participation in Patient Care: No (Comment) Care giver support system in place?: Yes (comment)   Criminal Activity/Legal Involvement Pertinent to Current Situation/Hospitalization: No - Comment as needed  Activities of Daily Living Home Assistive Devices/Equipment: None ADL Screening (condition at time of admission) Patient's cognitive ability adequate to safely complete daily activities?: Yes Is the patient deaf or have difficulty hearing?: No Does the patient have difficulty seeing, even when wearing glasses/contacts?: No Does the patient have difficulty concentrating, remembering, or making decisions?: No Patient able to express need for assistance with ADLs?: Yes Does the patient have difficulty dressing or bathing?: No Independently performs ADLs?: Yes (appropriate for developmental age) Does the patient have difficulty walking or climbing stairs?: Yes Weakness of Legs: None Weakness of Arms/Hands: None  Permission Sought/Granted                  Emotional Assessment Appearance:: (Assessed via phone due to +Covid diagnosis) Attitude/Demeanor/Rapport: Engaged   Orientation: : Oriented to Self, Oriented to Place, Oriented to  Time, Oriented to Situation  Psych Involvement: No (comment)  Admission diagnosis:  Shortness of breath [R06.02] Acute respiratory failure with hypoxia (HCC) [J96.01] Pneumonia due to COVID-19 virus [U07.1, J12.89] Patient Active Problem List   Diagnosis Date Noted  . Acute respiratory disease due to COVID-19 virus 03/15/2019  . Acute respiratory failure with hypoxia ([place])   . Pain in finger of right hand 01/27/2019  . Skin lesion 11/17/2018  . Complication of vascular access for dialysis 10/01/2018  . S/P CABG x 3 09/16/2018  . Coronary artery disease of native artery of native  heart with stable angina pectoris ([place]) 09/13/2018  . Encounter for therapeutic drug monitoring 09/02/2018  . Hydrocele 08/16/2018  . Penile pain 08/14/2018  . Chronic systolic (congestive) heart failure ([place])   . IBS (irritable bowel syndrome) 03/19/2018  . Abnormal stress test 03/19/2018  . URI (upper respiratory infection) 03/19/2018  . Muscle strain 03/19/2018  . Cut of lower leg 11/17/2017  . Depression 05/17/2017  . AV fistula ([place]) 05/17/2017  . Chronic pain of both knees 05/17/2017  . Arthralgia 05/17/2017  . Rash 05/17/2017  . Mild protein-calorie malnutrition ([place]) 04/29/2017  . Osteoarthritis 03/17/2017  . Trigger little finger of right hand 03/17/2017  . Hyperkalemia 03/17/2017  . Liver disease, unspecified 03/17/2017  . Other abnormal glucose 03/17/2017  . Other disorders of electrolyte and fluid balance, not elsewhere classified 03/17/2017  . Other disorders resulting from impaired renal tubular function 03/17/2017  . Unspecified jaundice 03/17/2017  . 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 Atrial fibrillation with RVR ([place]) 10/30/2016  . Ventral hernia 10/14/2016  . History of renal cell carcinoma 08/04/2016  . Renal transplant failure and rejection 07/31/2016  . Dependence on renal dialysis ([place]) 07/01/2016  . Coagulation defect, unspecified ([place]) 11/09/2015  . Diarrhea, unspecified 11/09/2015  . Pain, unspecified 11/09/2015  . Pruritus, unspecified 11/09/2015  . Shortness of breath 11/09/2015  . AKI (acute kidney injury) ([place]) 10/20/2015  . Volume overload 10/20/2015  . Microhematuria 10/01/2015  . Transplant recipient 10/01/2015  . Hypogonadism in male 07/25/2015  . 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 Gout 04/24/2015  . Visit for biopsy 01/03/2015  . Proteinuria 01/03/2015  . Chronic anticoagulation 11/06/2014  . Iron deficiency anemia 11/02/2014  . Rectal bleeding 11/02/2014  . Malignant neoplasm of unspecified kidney, except  renal pelvis ([place]) 05/09/2014  . Epigastric hernia 02/08/2014  . Incisional hernia, without obstruction or gangrene 01/30/2014  . 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[place]) 08/16/2013  . Vitamin D deficiency 08/16/2013  . Cervical radicular pain 07/07/2013  . Neck pain 07/07/2013  . Spinal stenosis 06/20/2013  . OSA (obstructive sleep apnea)   . Edema 02/22/2013  . Immunosuppression ([place]) 02/22/2013  . Hernia of abdominal wall 01/17/2013  . Hypomagnesemia 01/03/2013  . Tremor 12/30/2012  . History of kidney transplant 12/15/2012  . Immunosuppressive management encounter following kidney transplant 12/15/2012  . Kidney transplant status 12/15/2012  . S/P angioplasty with stent 09/07/2012  . Aortic valve replaced 09/07/2012  . Lumbar radiculopathy 09/06/2012  . ESRD on dialysis ([place]) 07/26/2012  . Hyperlipidemia 07/26/2012  . S/P AVR (aortic valve replacement) 06/23/2012  . Aortic valve disorder 06/14/2012  . Heart failure ([place]) 05/30/2012  . Thyroid nodule 06/26/2011  . Status post percutaneous transluminal coronary angioplasty 05/27/2011  . CAD (coronary artery disease) 05/26/2011  . Essential hypertension 05/19/2011   PCP:  Leone Haven, MD Pharmacy:   [id number] [place], [name] - [address]  [name] [address] Phone: [phone] Fax: [phone]  [facility] [place] [place] [facility] [address] [place] [address] Phone: [phone] Fax: [phone]     Social Determinants of Health (SDOH) Interventions    Readmission Risk Interventions Readmission Risk Prevention Plan 03/18/2019  Transportation Screening Complete  Medication Review ([place]) Complete  PCP or Specialist appointment within 3-5 days of discharge Complete  SW Recovery Care/Counseling Consult Complete  [place] Not  Applicable  Some recent data might be hidden

## 2019-03-19 LAB — COMPREHENSIVE METABOLIC PANEL
ALT: 22 U/L (ref 0–44)
AST: 31 U/L (ref 15–41)
Albumin: 3.2 g/dL — ABNORMAL LOW (ref 3.5–5.0)
Alkaline Phosphatase: 151 U/L — ABNORMAL HIGH (ref 38–126)
Anion gap: 25 — ABNORMAL HIGH (ref 5–15)
BUN: 118 mg/dL — ABNORMAL HIGH (ref 8–23)
CO2: 19 mmol/L — ABNORMAL LOW (ref 22–32)
Calcium: 8 mg/dL — ABNORMAL LOW (ref 8.9–10.3)
Chloride: 95 mmol/L — ABNORMAL LOW (ref 98–111)
Creatinine, Ser: 10.91 mg/dL — ABNORMAL HIGH (ref 0.61–1.24)
GFR calc Af Amer: 5 mL/min — ABNORMAL LOW (ref >60)
GFR calc non Af Amer: 4 mL/min — ABNORMAL LOW (ref >60)
Glucose, Bld: 161 mg/dL — ABNORMAL HIGH (ref 70–99)
Potassium: 5.1 mmol/L (ref 3.5–5.1)
Sodium: 139 mmol/L (ref 135–145)
Total Bilirubin: 1.4 mg/dL — ABNORMAL HIGH (ref 0.3–1.2)
Total Protein: 7.1 g/dL (ref 6.5–8.1)

## 2019-03-19 LAB — FIBRIN DERIVATIVES D-DIMER (ARMC ONLY): Fibrin derivatives D-dimer (ARMC): 700.06 ng/mL (FEU) — ABNORMAL HIGH (ref 0.00–499.00)

## 2019-03-19 LAB — C-REACTIVE PROTEIN: CRP: 10.4 mg/dL — ABNORMAL HIGH (ref <1.0)

## 2019-03-19 LAB — PROTIME-INR
INR: 2.9 — ABNORMAL HIGH (ref 0.8–1.2)
Prothrombin Time: 30.1 seconds — ABNORMAL HIGH (ref 11.4–15.2)

## 2019-03-19 LAB — FERRITIN: Ferritin: 1651 ng/mL — ABNORMAL HIGH (ref 24–336)

## 2019-03-19 MED ORDER — NEPRO/CARBSTEADY PO LIQD: 237.0000 mL | Freq: Three times a day (TID) | ORAL | 3 refills | Status: DC

## 2019-03-19 MED ORDER — ASCORBIC ACID 500 MG PO TABS: 500.0000 mg | ORAL_TABLET | Freq: Every day | ORAL | 0 refills | Status: DC

## 2019-03-19 MED ORDER — ZINC SULFATE 220 (50 ZN) MG PO CAPS: 220.0000 mg | ORAL_CAPSULE | Freq: Every day | ORAL | 0 refills | Status: DC

## 2019-03-19 MED ORDER — IPRATROPIUM BROMIDE HFA 17 MCG/ACT IN AERS: 2.0000 | INHALATION_SPRAY | RESPIRATORY_TRACT | 12 refills | Status: DC

## 2019-03-19 MED ORDER — ALBUTEROL SULFATE HFA 108 (90 BASE) MCG/ACT IN AERS: 2.0000 | INHALATION_SPRAY | RESPIRATORY_TRACT | 0 refills | Status: DC | PRN

## 2019-03-19 MED ORDER — WARFARIN SODIUM 1 MG PO TABS
1.0000 mg | ORAL_TABLET | Freq: Once | ORAL | Status: DC
  Filled 2019-03-19: qty 1

## 2019-03-19 MED ORDER — GUAIFENESIN ER 600 MG PO TB12: 600.0000 mg | ORAL_TABLET | Freq: Two times a day (BID) | ORAL | 0 refills | Status: DC | PRN

## 2019-03-19 MED ORDER — DEXTROMETHORPHAN POLISTIREX ER 30 MG/5ML PO SUER: 30.0000 mg | Freq: Two times a day (BID) | ORAL | 0 refills | Status: DC

## 2019-03-19 MED ORDER — PREDNISONE 20 MG PO TABS: 40.0000 mg | ORAL_TABLET | Freq: Every day | ORAL | 0 refills | Status: DC

## 2019-03-19 NOTE — Progress Notes (Signed)
[place] for warfarin Indication: atrial fibrillation and hx mech AVR  Allergies  Allergen Reactions  . Statins     Other reaction(s): Arthralgias (intolerance), Myalgias (intolerance)  Pt taking pravastatin   . Sertraline Rash    Patient Measurements: Height: 5\' 10"  (177.8 cm) Weight: 207 lb 3.7 oz (94 kg) IBW/kg (Calculated) : 73  Vital Signs: Temp: 98 F (36.7 C) (12/12 0503) Temp Source: Oral (12/12 0503) BP: 166/78 (12/12 0503) Pulse Rate: 78 (12/12 0503)  Labs: Recent Labs    03/17/19 0645 03/18/19 0511 03/19/19 0500  HGB 10.8*  --   --   HCT 33.3*  --   --   PLT 121*  --   --   LABPROT 32.5* 26.2* 30.1*  INR 3.2* 2.4* 2.9*  CREATININE 12.19* 9.23* 10.91*    Estimated Creatinine Clearance: 8.1 mL/min (A) (by C-G formula based on SCr of 10.91 mg/dL (H)).   Medical History: Past Medical History:  Diagnosis Date  . 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 Aortic stenosis    a. 2014 s/p mech AVR ([facility])-->chronic coumadin; b. 08/2018 Echo: Mild AS/AI. Nl fxn/gradient.  . Arthritis   . Atrial fibrillation ([place])   . CAD (coronary artery disease)    a. 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 02/2018 MV [facility]): large inf, infsept, inflat infarct w/ min rev, EF 30%; c. 06/2018 Cath: LM min irregs, LAD 60p, 95/30/44m, LCX min irregs, RCA 40p, 1m, 90d, RPAV 90, RPDA 95ost; c. 09/2018 CABGx3 (LIMA->LAD, VG->D1, VG->PDA).  . Chronic anticoagulation 06/2012   a. Coumadin in the setting of mech AVR and afib.  Marland Kitchen CPAP (continuous positive airway pressure) dependence   . Dialysis patient [facility]) 04/07/2006   Patient started HD around 1996,  had 1st transplant LLQ around 1998 until 2007 when they found renal cell cancer in transplant and both native kidneys, all were removed > went back on HD until 2nd transplant RLQ in 2014 which lasted 3 yrs; it was embolized for suspected  acute rejection. Is currently on HD MWF in [place] w/ [facility] nephrology.  . ESRD (end stage renal disease) ([place])    a. 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 1999 s/p Kidney transplant; c. 2014 Bilat nephrectomy (transplanted kidney resected) in the setting of renal cell carcinoma; d. PD until 11/2017-->HD.  Marland Kitchen Heart murmur   . HFrEF (heart failure with reduced ejection fraction) ([place])    a. 07/2016 Echo: EF 55-60%; b. 02/2018 MV: EF 30%; c. 04/2018 Echo: EF 35%; d. 08/2018 Echo: EF 30-35%. Glob HK w/o rwma. Mildly reduced RV fxn. Mod to sev dil LA. Nl AoV fxn.  Marland Kitchen Hx of colonoscopy    2009 by Dr. Laural Golden. Normal except for small submucosal lipoma. No evidence of polyps or colitis.  . Hyperlipidemia   . Hypertension   . Hypogonadism in male 07/25/2015  . Ischemic cardiomyopathy    a. 07/2016 Echo: EF 55-60%; b. 02/2018 MV: EF 30%; c. 04/2018 Echo: EF 35%; d. 08/2018 Echo: EF 30-35%.  . OSA (obstructive sleep apnea)    No CPAP  . Permanent atrial fibrillation (HCC)    a. CHA2DS2VASc = 3-->Coumadin (also mech AVR); b. 06/2018 Amio started 2/2 elevated rates.  . Renal cell cancer ([place])   . Renal cell carcinoma ([place]) 08/04/2016      Assessment: 63 yo male on warfarin PTA for AFib  and mechanical AVR (2014). Pharmacy consulted to continue dosing warfarin. Per med rec pt taking warfarin 1 mg all days except 0.5 mg on Friday (med rec done by tech by calling spouse who stated dose has been changing; pt last took meds Sunday night).   12/8 INR 3.9 12/9 INR 4.3 12/10 INR 3.3 12/11 INR 2.4 12/12 INR 2.9  Goal of Therapy:  INR 2.5-3.5 - confirmed INR goal with admitting MD Monitor platelets by anticoagulation protocol: Yes   Plan:  INR therapeutic, continue home dose of warfarin 1 mg tonight.  Patient also on amiodarone and allopurinol, which can increase INR levels. F/u INR in AM. CBC at least every three days per policy.   Pharmacy will continue to follow.   Tawnya Crook,  PharmD 03/19/2019,9:50 AM

## 2019-03-19 NOTE — Discharge Summary (Signed)
Physician Discharge Summary  Patient ID: Jeremy [name] MRN: [id number] DOB/AGE: [birth year] 63 y.o.  Admit date: 03/15/2019 Discharge date: 03/19/2019  Admission Diagnoses:  Discharge Diagnoses:  Principal Problem:   Acute respiratory disease due to COVID-19 virus Active Problems:   S/P AVR (aortic valve replacement)   ESRD on dialysis (HCC)   CAD (coronary artery disease)   Hyperlipidemia   Essential hypertension   OSA (obstructive sleep apnea)   Gout   Anemia in chronic kidney disease   Atrial fibrillation with RVR (HCC)   Chronic systolic (congestive) heart failure (HCC)   S/P CABG x 3   Discharged Condition: fair  Hospital Course:  Brief Narrative:  63 year old male with history of end-stage renal disease, mechanical aortic valve replacement on Coumadin, paroxysmal atrial fibrillation, chronic systolic heart failure, admitted to the hospital with cough, shortness of breath and fever.  Found to have COVID-19 positive.  Chest x-ray indicated bilateral infiltrates.  CRP markedly elevated over 20.  Started on intravenous steroids and remdesivir.  1. Pneumonia secondary to COVID-19 virus.  Patient noted to have bilateral infiltrates on chest x-ray.  CRP was markedly elevated at greater than 20.  Ferritin also significantly elevated.  -Completed remdesivir (day 5/5) on 03/19/2019 and received intravenous steroids 5 days.  Sent home with prednisone for 5 more days - Patient has been on room air.  -  He has been encouraged to lay in a prone position for as long as he will tolerate.  With treatment, his inflammatory markers are trending down -Patient was eager to go home this morning following his last dose of remdesivir infusion -Patient was explained warning signs and symptoms when she must seek immediate medical attention.  Patient expressed understanding. -Patient is also to remain in quarantine for a total of 14 days since his positive test. -To follow-up with PCP in a  week  2. Status post aortic valve replacement.  Continue anticoagulation with Coumadin  3. End-stage renal disease on hemodialysis.  -  Nephrology consulted -Patient is to receive hemodialysis outpatient.  He received instruction from the nephrologist as well.  4. Atrial fibrillation with rapid ventricular response.  -His Heart rate was in the 110s to 120s later resolved to rate controlled - Continue on metoprolol and amiodarone.  He is anticoagulated with Coumadin.  Continue home dose  5. Chronic systolic congestive heart failure.  Ejection fraction 45%.  Volume management with dialysis.  Appears euvolemic  Consults: nephrology  Significant Diagnostic Studies: Logic images and blood tests  Treatments: As per hospital course and discharge med list  Discharge Exam: Blood pressure (!) 166/78, pulse 78, temperature 98 F (36.7 C), temperature source Oral, resp. rate 16, height 5\' 10"  (1.778 m), weight 94 kg, SpO2 99 %.  General exam: Appears calm and comfortable  Respiratory system: Clear to auscultation. Respiratory effort normal. Cardiovascular system: S1 & S2 heard, RRR. No JVD, murmurs, rubs, gallops or clicks. No pedal edema. Gastrointestinal system: Abdomen is nondistended, soft and nontender. No organomegaly or masses felt. Normal bowel sounds heard. Central nervous system: Alert and oriented. No focal neurological deficits. Extremities: Symmetric 5 x 5 power. Skin: No rashes, lesions or ulcers Psychiatry: Judgement and insight appear normal. Mood & affect appropriate.   Disposition: Discharge disposition: 01-Home or Self Care     Per previous MDs plan patient was to be discharged following completion of remdesivir course if he remains clinically stable.  Patient received and completed his remdesivir course and he remained stable.  Patient was  to go to dialysis center after discharge today to receive his hemodialysis.  Patient was stable enough to be discharged with  above-mentioned follow-up and precaution and quarantine instruction.  Discharge Instructions    Call MD for:  difficulty breathing, headache or visual disturbances   Complete by: As directed    Call MD for:  temperature >100.4   Complete by: As directed    Diet - low sodium heart healthy   Complete by: As directed    And Renal Diet   Increase activity slowly   Complete by: As directed    Isolation for a total of 14 days.   Other Restrictions   Complete by: As directed    CoViD-19 isolation for 11 more days. May contact PCP to be retested.     Allergies as of 03/19/2019      Reactions   Statins    Other reaction(s): Arthralgias (intolerance), Myalgias (intolerance) Pt taking pravastatin    Sertraline Rash      Medication List    TAKE these medications   acetaminophen 500 MG tablet Commonly known as: TYLENOL Take 1,000 mg 2 (two) times daily as needed by mouth for moderate pain.   albuterol 108 (90 Base) MCG/ACT inhaler Commonly known as: VENTOLIN HFA Inhale 2 puffs into the lungs every 4 (four) hours as needed for wheezing or shortness of breath.   allopurinol 300 MG tablet Commonly known as: ZYLOPRIM Take 300 mg by mouth daily.   amiodarone 200 MG tablet Commonly known as: PACERONE Take 1 tablet (200 mg total) by mouth daily.   ascorbic acid 500 MG tablet Commonly known as: VITAMIN C Take 1 tablet (500 mg total) by mouth daily. Start taking on: March 20, 2019   aspirin 81 MG EC tablet Take 1 tablet (81 mg total) by mouth daily.   cinacalcet 30 MG tablet Commonly known as: SENSIPAR Take 30 mg by mouth every other day.   dextromethorphan 30 MG/5ML liquid Commonly known as: DELSYM Take 5 mLs (30 mg total) by mouth 2 (two) times daily.   feeding supplement (NEPRO CARB STEADY) Liqd Take 237 mLs by mouth 3 (three) times daily between meals.   folic acid-vitamin b complex-vitamin c-selenium-zinc 3 MG Tabs tablet Take 1 tablet by mouth daily.    guaiFENesin 600 MG 12 hr tablet Commonly known as: MUCINEX Take 1 tablet (600 mg total) by mouth 2 (two) times daily as needed for cough or to loosen phlegm.   ipratropium 17 MCG/ACT inhaler Commonly known as: ATROVENT HFA Inhale 2 puffs into the lungs every 4 (four) hours.   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   metoprolol tartrate 25 MG tablet Commonly known as: LOPRESSOR TAKE 1/2 TABLETS (12.5 MG TOTAL) BY MOUTH 2 (TWO) TIMES DAILY.   multivitamin Tabs tablet Take 1 tablet by mouth daily.   omeprazole 20 MG capsule Commonly known as: PRILOSEC Take 20 mg by mouth daily.   pravastatin 20 MG tablet Commonly known as: PRAVACHOL Take 1 tablet (20 mg total) by mouth every evening.   predniSONE 5 MG tablet Commonly known as: DELTASONE Take 5 mg by mouth daily with breakfast. What changed: Another medication with the same name was added. Make sure you understand how and when to take each.   predniSONE 20 MG tablet Commonly known as: Deltasone Take 2 tablets (40 mg total) by mouth daily. What changed: You were already taking a medication with the same name, and this  prescription was added. Make sure you understand how and when to take each.   sevelamer carbonate 800 MG tablet Commonly known as: RENVELA Take 40,000 mg by mouth 3 (three) times daily with meals. 5 tabs per meal   warfarin 1 MG tablet Commonly known as: COUMADIN Take as directed. If you are unsure how to take this medication, talk to your nurse or doctor. Original instructions: Take 1 tablet (1 mg total) by mouth daily at 6 PM. What changed:   how much to take  when to take this  additional instructions   zinc sulfate 220 (50 Zn) MG capsule Take 1 capsule (220 mg total) by mouth daily. Start taking on: March 20, 2019      Follow-up Information    Leone Haven, MD. Schedule an appointment as soon as possible for a visit.    Specialty: Family Medicine Why: Please make appt for 3-5 days after discharge Contact information: [address] [place] [address] [phone]        Wellington Hampshire, MD .   Specialty: Cardiology Contact information: [address] [place] [address] [phone]           Signed: Thornell Mule 03/19/2019, 3:22 PM

## 2019-03-19 NOTE — Plan of Care (Signed)
Discharge order received. Patient mental status is at baseline. Vital signs stable . No signs of acute distress. Discharge instructions given. Patient verbalized understanding. No other issues noted at this time.   

## 2019-03-20 LAB — CULTURE, BLOOD (ROUTINE X 2)
Culture: NO GROWTH
Culture: NO GROWTH
Special Requests: ADEQUATE
Special Requests: ADEQUATE

## 2019-03-21 ENCOUNTER — Telehealth: Payer: Self-pay

## 2019-03-21 NOTE — Telephone Encounter (Signed)
Unable to reach patient for transitional care management. First attempt. Will follow as appropriate.

## 2019-03-22 ENCOUNTER — Encounter: Payer: Self-pay | Admitting: Urology

## 2019-03-22 ENCOUNTER — Ambulatory Visit: Payer: Medicare Other | Admitting: Urology

## 2019-03-22 ENCOUNTER — Ambulatory Visit: Payer: Medicare Other

## 2019-03-22 NOTE — Telephone Encounter (Signed)
Lmov to schedule  

## 2019-03-22 NOTE — Telephone Encounter (Signed)
Transition Care Management Follow-up Telephone Call   Date discharged? 03/19/19   How have you been since you were released from the hospital? Patient states, "I have a non productive cough and am weak." Using nebulizer twice a day and inhaler every 4 hours. Fluid restriction 32 ounces. Eating/drinking good. Denies nausea, vomiting, diarrhea.    Do you understand why you were in the hospital? Yes.   Do you understand the discharge instructions? Yes, lay on front and sides at bedtime.     Where were you discharged to? Home.   Items Reviewed:  Medications reviewed: Yes, taking as scheduled. Prednisone start today. No issues.   Allergies reviewed: Yes, none new.  Dietary changes reviewed: Yes, low sodium heart healthy.   Referrals reviewed: Yes, Cardiology   Functional Questionnaire:   Activities of Daily Living (ADLs):   He states they are independent in the following: Independent  States they require assistance with the following: No assistance required at this time.    Any transportation issues/concerns?: None at this time.    Any patient concerns? None at this time.    Confirmed importance and date/time of follow-up visits scheduled? Yes, appointment scheduled 03/23/19 @ 930 via [email or web address].  Provider Appointment booked with Dr. Caryl Bis, pcp.   Confirmed with patient if condition begins to worsen call PCP or go to the ER.  Patient was given the office number and encouraged to call back with question or concerns.  : Yes.

## 2019-03-23 ENCOUNTER — Encounter: Payer: Self-pay | Admitting: Cardiovascular Disease

## 2019-03-23 ENCOUNTER — Ambulatory Visit: Payer: Medicare Other

## 2019-03-23 ENCOUNTER — Telehealth: Payer: Self-pay | Admitting: Cardiovascular Disease

## 2019-03-23 ENCOUNTER — Ambulatory Visit: Payer: Medicare Other | Admitting: Family Medicine

## 2019-03-23 ENCOUNTER — Encounter: Payer: Self-pay | Admitting: Family Medicine

## 2019-03-23 ENCOUNTER — Ambulatory Visit (INDEPENDENT_AMBULATORY_CARE_PROVIDER_SITE_OTHER): Payer: Medicare Other | Admitting: Family Medicine

## 2019-03-23 ENCOUNTER — Ambulatory Visit (INDEPENDENT_AMBULATORY_CARE_PROVIDER_SITE_OTHER): Payer: Medicare Other

## 2019-03-23 ENCOUNTER — Other Ambulatory Visit: Payer: Self-pay

## 2019-03-23 DIAGNOSIS — Z952 Presence of prosthetic heart valve: Secondary | ICD-10-CM

## 2019-03-23 DIAGNOSIS — Z5181 Encounter for therapeutic drug level monitoring: Secondary | ICD-10-CM

## 2019-03-23 DIAGNOSIS — Z7901 Long term (current) use of anticoagulants: Secondary | ICD-10-CM | POA: Diagnosis not present

## 2019-03-23 DIAGNOSIS — U071 COVID-19: Secondary | ICD-10-CM

## 2019-03-23 DIAGNOSIS — I4891 Unspecified atrial fibrillation: Secondary | ICD-10-CM

## 2019-03-23 DIAGNOSIS — I4821 Permanent atrial fibrillation: Secondary | ICD-10-CM

## 2019-03-23 LAB — POCT INR
INR: 8 — AB (ref 2.0–3.0)
INR: 8 — AB (ref 2.0–3.0)

## 2019-03-23 NOTE — Telephone Encounter (Addendum)
Spoke w/ pt after receiving POCT test results of >8.0 from his mdINR machine.  Advised pt that he will need venipuncture to get an accurate reading, as the test strips are not accurate that high. Pt reports that he was just released from the hospital w/ COVID and is still under quarantine. Spoke w/ [facility] office and agreed that pt cannot leave home, so we will need to dose him based on reading that we have.  Called pt back and advised him to have a large serving of greens today, hold warfarin today & tomorrow, and recheck on Friday morning.  Pt started on prednisone 40 mg on 03/19/19 - reminded pt of the need to call & let us know when he starts new meds, esp abx or steroids so we can potentially reduce his warfarin dosage. Provided him w/ [facility] office # to call w/ results. Pt verbalizes understanding and is agreeable.

## 2019-03-23 NOTE — Telephone Encounter (Signed)
Patient calling with INR results 8.0

## 2019-03-23 NOTE — Patient Instructions (Signed)
Spoke w/ pt after receiving POCT test results of >8.0 from his mdINR machine.  Advised pt that he will need venipuncture to get an accurate reading, as the test strips are not accurate that high. Pt reports that he was just released from the hospital w/ COVID and is still under quarantine. Spoke w/ [facility] office and agreed that pt cannot leave home, so we will need to dose him based on reading that we have.  Called pt back and advised him to have a large serving of greens today, hold warfarin today & tomorrow, and recheck on Friday morning.  Pt started on prednisone 40 mg on 03/19/19 - reminded pt of the need to call & let us know when he starts new meds, esp abx or steroids so we can potentially reduce his warfarin dosage. Provided him w/ [facility] office # to call w/ results. Pt verbalizes understanding and is agreeable.

## 2019-03-23 NOTE — Progress Notes (Signed)
Virtual Visit via telephone Note  This visit type was conducted due to national recommendations for restrictions regarding the COVID-19 pandemic (e.g. social distancing).  This format is felt to be most appropriate for this patient at this time.  All issues noted in this document were discussed and addressed.  No physical exam was performed (except for noted visual exam findings with Video Visits).   I connected with Jeremy [name] today at  9:30 AM EST by telephone and verified that I am speaking with the correct person using two identifiers. Location patient: home Location provider: work Persons participating in the virtual visit: patient, provider, Elijio Miles (girlfriend)   I discussed the limitations, risks, security and privacy concerns of performing an evaluation and management service by telephone and the availability of in person appointments. I also discussed with the patient that there may be a patient responsible charge related to this service. The patient expressed understanding and agreed to proceed.  Interactive audio and video telecommunications were attempted between this provider and patient, however failed, due to patient having technical difficulties OR patient did not have access to video capability.  We continued and completed visit with audio only.   Reason for visit: hospital follow-up  HPI: COVID-19: Patient was hospitalized for COVID-19 pneumonia.  Was treated with remdesivir and IV steroids.  He was encouraged to lay in the prone position as long as he could tolerate it.  He notes he continues to have some fatigue.  He does still cough some at night.  He does feel congested and is not able to get much up.  No shortness of breath.  No fevers.  He continues on prednisone.  He is taking Mucinex.  He is on a fluid restriction given his heart failure and kidney dysfunction of 32 ounces daily.  He continues on quarantine and notes the hospital and the health department both  advised him that he could come off of quarantine on 03/28/2019.  He is going to dialysis on Tuesday Thursday Saturday.  He notes they are testing him for Covid and he needs to have 2 negative tests prior to returning to the general dialysis population.  Anticoagulation: Patient's INR was 8.  He is already discussed this with his cardiologist and they have a plan in place.  He is holding his Coumadin for the next 2 days and then is going to recheck his INR on Friday.  No bleeding issues.   ROS: See pertinent positives and negatives per HPI.  Past Medical History:  Diagnosis Date  . 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 Aortic stenosis    a. 2014 s/p mech AVR ([facility])-->chronic coumadin; b. 08/2018 Echo: Mild AS/AI. Nl fxn/gradient.  . Arthritis   . Atrial fibrillation ([place])   . CAD (coronary artery disease)    a. 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 02/2018 MV [facility]): large inf, infsept, inflat infarct w/ min rev, EF 30%; c. 06/2018 Cath: LM min irregs, LAD 60p, 95/30/1m, LCX min irregs, RCA 40p, 7m, 90d, RPAV 90, RPDA 95ost; c. 09/2018 CABGx3 (LIMA->LAD, VG->D1, VG->PDA).  . Chronic anticoagulation 06/2012   a. Coumadin in the setting of mech AVR and afib.  Marland Kitchen CPAP (continuous positive airway pressure) dependence   . Dialysis patient [facility]) 04/07/2006   Patient started HD around 1996,  had 1st transplant LLQ around [place] until 2007 when they found renal cell cancer in transplant and  both native kidneys, all were removed > went back on HD until 2nd transplant RLQ in 2014 which lasted 3 yrs; it was embolized for suspected acute rejection. Is currently on HD MWF in [place] w/ [facility] nephrology.  . ESRD (end stage renal disease) ([place])    a. 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 1999 s/p Kidney transplant; c. 2014 Bilat nephrectomy (transplanted kidney resected) in the setting of renal cell carcinoma; d. PD until 11/2017-->HD.    Marland Kitchen Heart murmur   . HFrEF (heart failure with reduced ejection fraction) ([place])    a. 07/2016 Echo: EF 55-60%; b. 02/2018 MV: EF 30%; c. 04/2018 Echo: EF 35%; d. 08/2018 Echo: EF 30-35%. Glob HK w/o rwma. Mildly reduced RV fxn. Mod to sev dil LA. Nl AoV fxn.  Marland Kitchen Hx of colonoscopy    2009 by Dr. Laural Golden. Normal except for small submucosal lipoma. No evidence of polyps or colitis.  . Hyperlipidemia   . Hypertension   . Hypogonadism in male 07/25/2015  . Ischemic cardiomyopathy    a. 07/2016 Echo: EF 55-60%; b. 02/2018 MV: EF 30%; c. 04/2018 Echo: EF 35%; d. 08/2018 Echo: EF 30-35%.  . OSA (obstructive sleep apnea)    No CPAP  . Permanent atrial fibrillation ([facility])    a. CHA2DS2VASc = 3-->Coumadin (also mech AVR); b. 06/2018 Amio started 2/2 elevated rates.  . Renal cell cancer ([place])   . Renal cell carcinoma ([place]) 08/04/2016    Past Surgical History:  Procedure Laterality Date  . AORTIC VALVE REPLACEMENT  3 /11 /2014   At [place]    . CAPD INSERTION N/A 02/19/2017   Procedure: LAPAROSCOPIC INSERTION CONTINUOUS AMBULATORY PERITONEAL DIALYSIS  (CAPD) CATHETER;  Surgeon: Algernon Huxley, MD;  Location: [facility];  Service: General;  Laterality: N/A;  . CORONARY ANGIOPLASTY WITH STENT PLACEMENT    . CORONARY ARTERY BYPASS GRAFT N/A 09/16/2018   Procedure: CORONARY ARTERY BYPASS GRAFT times three      with left internal mammary artery and endoharvest of right leg greater saphenous vein;  Surgeon: Melrose Nakayama, MD;  Location: [facility];  Service: Open Heart Surgery;  Laterality: N/A;  . DG AV DIALYSIS  SHUNT ACCESS EXIST*L* OR     left arm  . EPIGASTRIC HERNIA REPAIR N/A 02/19/2017   Procedure: HERNIA REPAIR EPIGASTRIC ADULT;  Surgeon: Robert Bellow, MD;  Location: [facility];  Service: General;  Laterality: N/A;  . EYE SURGERY     bilateral cataract  . HERNIA REPAIR     UMBILICAL HERNIA  . HERNIA REPAIR  02/19/2017  . INNER EAR SURGERY     20 years  ago  . KIDNEY TRANSPLANT    . Peritoneal Dialysis access placement  02/19/2017  . REMOVAL OF A DIALYSIS CATHETER N/A 11/23/2017   Procedure: REMOVAL OF A PERITONEAL DIALYSIS CATHETER;  Surgeon: Algernon Huxley, MD;  Location: [facility];  Service: Vascular;  Laterality: N/A;  . RIGHT/LEFT HEART CATH AND CORONARY ANGIOGRAPHY Bilateral 06/07/2018   Procedure: RIGHT/LEFT HEART CATH AND CORONARY ANGIOGRAPHY;  Surgeon: Wellington Hampshire, MD;  Location: [facility];  Service: Cardiovascular;  Laterality: Bilateral;  . TEE WITHOUT CARDIOVERSION N/A 07/21/2016   Procedure: TRANSESOPHAGEAL ECHOCARDIOGRAM (TEE);  Surgeon: Wellington Hampshire, MD;  Location: [facility];  Service: Cardiovascular;  Laterality: N/A;  . TEE WITHOUT CARDIOVERSION N/A 09/16/2018   Procedure: TRANSESOPHAGEAL ECHOCARDIOGRAM (TEE);  Surgeon: Melrose Nakayama, MD;  Location:  [facility];  Service: Open Heart Surgery;  Laterality: N/A;    Family History  Problem Relation Age of Onset  . Cancer Mother        pancreatic  . Heart disease Father   . Diabetes Father     SOCIAL HX: Non-smoker.   Current Outpatient Medications:  .  acetaminophen (TYLENOL) 500 MG tablet, Take 1,000 mg 2 (two) times daily as needed by mouth for moderate pain., Disp: , Rfl:  .  albuterol (VENTOLIN HFA) 108 (90 Base) MCG/ACT inhaler, Inhale 2 puffs into the lungs every 4 (four) hours as needed for wheezing or shortness of breath., Disp: 6.7 g, Rfl: 0 .  allopurinol (ZYLOPRIM) 300 MG tablet, Take 300 mg by mouth daily., Disp: , Rfl: 5 .  amiodarone (PACERONE) 200 MG tablet, Take 1 tablet (200 mg total) by mouth daily., Disp: 180 tablet, Rfl: 3 .  aspirin EC 81 MG EC tablet, Take 1 tablet (81 mg total) by mouth daily., Disp:  , Rfl:  .  cinacalcet (SENSIPAR) 30 MG tablet, Take 30 mg by mouth every other day. , Disp: , Rfl:  .  dextromethorphan (DELSYM) 30 MG/5ML liquid, Take 5 mLs (30 mg total) by mouth 2 (two) times daily., Disp: 89 mL, Rfl: 0 .  folic  acid-vitamin b complex-vitamin c-selenium-zinc (DIALYVITE) 3 MG TABS tablet, Take 1 tablet by mouth daily., Disp: , Rfl:  .  guaiFENesin (MUCINEX) 600 MG 12 hr tablet, Take 1 tablet (600 mg total) by mouth 2 (two) times daily as needed for cough or to loosen phlegm., Disp: 30 tablet, Rfl: 0 .  ipratropium (ATROVENT HFA) 17 MCG/ACT inhaler, Inhale 2 puffs into the lungs every 4 (four) hours., Disp: 1 Inhaler, Rfl: 12 .  lidocaine-prilocaine (EMLA) cream, Apply 1 application topically as needed (for fistula)., Disp: , Rfl:  .  metoprolol tartrate (LOPRESSOR) 25 MG tablet, TAKE 1/2 TABLETS (12.5 MG TOTAL) BY MOUTH 2 (TWO) TIMES DAILY., Disp: 30 tablet, Rfl: 1 .  multivitamin (RENA-VIT) TABS tablet, Take 1 tablet by mouth daily., Disp: , Rfl:  .  Nutritional Supplements (FEEDING SUPPLEMENT, NEPRO CARB STEADY,) LIQD, Take 237 mLs by mouth 3 (three) times daily between meals., Disp: 237 mL, Rfl: 3 .  omeprazole (PRILOSEC) 20 MG capsule, Take 20 mg by mouth daily., Disp: , Rfl:  .  predniSONE (DELTASONE) 20 MG tablet, Take 2 tablets (40 mg total) by mouth daily., Disp: 10 tablet, Rfl: 0 .  predniSONE (DELTASONE) 5 MG tablet, Take 5 mg by mouth daily with breakfast. , Disp: , Rfl:  .  sevelamer carbonate (RENVELA) 800 MG tablet, Take 40,000 mg by mouth 3 (three) times daily with meals. 5 tabs per meal, Disp: , Rfl:  .  vitamin C (VITAMIN C) 500 MG tablet, Take 1 tablet (500 mg total) by mouth daily., Disp: 30 tablet, Rfl: 0 .  warfarin (COUMADIN) 1 MG tablet, Take 1 tablet (1 mg total) by mouth daily at 6 PM. (Patient taking differently: Take 1-2 mg by mouth See admin instructions. 1 mg all days , except 0.5 MG on Fridays..* OR AS DIRECTED), Disp: 90 tablet, Rfl: 1 .  zinc sulfate 220 (50 Zn) MG capsule, Take 1 capsule (220 mg total) by mouth daily., Disp: 30 capsule, Rfl: 0 .  pravastatin (PRAVACHOL) 20 MG tablet, Take 1 tablet (20 mg total) by mouth every evening., Disp: 90 tablet, Rfl: 3  EXAM:  VITALS  per patient if applicable: None.  GENERAL: alert, oriented, appears well and  in no acute distress  HEENT: atraumatic, conjunttiva clear, no obvious abnormalities on inspection of external nose and ears  NECK: normal movements of the head and neck  LUNGS: on inspection no signs of respiratory distress, breathing rate appears normal, no obvious gross SOB, gasping or wheezing  CV: no obvious cyanosis  MS: moves all visible extremities without noticeable abnormality  PSYCH/NEURO: pleasant and cooperative, no obvious depression or anxiety, speech and thought processing grossly intact  ASSESSMENT AND PLAN:  Discussed the following assessment and plan:  COVID-19 Patient is progressively recovering.  Discussed continuing on prednisone and Mucinex.  Advised that it could take weeks for him to get back to baseline or start to completely feel better.  Advised if he develop any shortness of breath or any worsening symptoms he should let us know right away.  If he has significant shortness of breath he should be evaluated in the emergency department.  He will remain under quarantine as he was previously advised.  Chronic anticoagulation INR is elevated.  He will follow the instructions given by his cardiologist.  I did advise that if he developed any bleeding issues that he could not get stopped very quickly he would need to go to the emergency department for evaluation.    I discussed the assessment and treatment plan with the patient. The patient was provided an opportunity to ask questions and all were answered. The patient agreed with the plan and demonstrated an understanding of the instructions.   The patient was advised to call back or seek an in-person evaluation if the symptoms worsen or if the condition fails to improve as anticipated.  I provided 12 minutes of non-face-to-face time during this encounter.   Tommi Rumps, MD

## 2019-03-23 NOTE — Assessment & Plan Note (Signed)
INR is elevated.  He will follow the instructions given by his cardiologist.  I did advise that if he developed any bleeding issues that he could not get stopped very quickly he would need to go to the emergency department for evaluation.

## 2019-03-23 NOTE — Assessment & Plan Note (Signed)
Patient is progressively recovering.  Discussed continuing on prednisone and Mucinex.  Advised that it could take weeks for him to get back to baseline or start to completely feel better.  Advised if he develop any shortness of breath or any worsening symptoms he should let us know right away.  If he has significant shortness of breath he should be evaluated in the emergency department.  He will remain under quarantine as he was previously advised.

## 2019-03-24 ENCOUNTER — Ambulatory Visit: Payer: Medicare Other

## 2019-03-25 ENCOUNTER — Ambulatory Visit (INDEPENDENT_AMBULATORY_CARE_PROVIDER_SITE_OTHER): Payer: Medicare Other | Admitting: Cardiovascular Disease

## 2019-03-25 DIAGNOSIS — Z5181 Encounter for therapeutic drug level monitoring: Secondary | ICD-10-CM | POA: Diagnosis not present

## 2019-03-25 LAB — POCT INR: INR: 3.8 — AB (ref 2.0–3.0)

## 2019-03-25 NOTE — Patient Instructions (Addendum)
Description   Instructed for pt to hold coumadin today then continue normal dose of 1 tablet daily except for 1/2 a tablet on Fridays. Recheck INR on 12/21. [facility] [phone].

## 2019-03-28 ENCOUNTER — Encounter: Payer: Self-pay | Admitting: Cardiovascular Disease

## 2019-03-28 ENCOUNTER — Telehealth: Payer: Self-pay

## 2019-03-28 ENCOUNTER — Ambulatory Visit (INDEPENDENT_AMBULATORY_CARE_PROVIDER_SITE_OTHER): Payer: Medicare Other

## 2019-03-28 DIAGNOSIS — Z5181 Encounter for therapeutic drug level monitoring: Secondary | ICD-10-CM

## 2019-03-28 DIAGNOSIS — Z952 Presence of prosthetic heart valve: Secondary | ICD-10-CM | POA: Diagnosis not present

## 2019-03-28 DIAGNOSIS — I4891 Unspecified atrial fibrillation: Secondary | ICD-10-CM

## 2019-03-28 DIAGNOSIS — I4821 Permanent atrial fibrillation: Secondary | ICD-10-CM

## 2019-03-28 LAB — POCT INR
INR: 2.2 (ref 2.0–3.0)
INR: 2.2 (ref 2.0–3.0)

## 2019-03-28 NOTE — Patient Instructions (Signed)
Instructed pt to continue dose of 1 tablet daily except for 1/2 a tablet on Fridays.  Recheck INR in 1 week.  [facility] [phone].

## 2019-03-28 NOTE — Telephone Encounter (Signed)
Please see anti-coag note for today. 

## 2019-03-28 NOTE — Telephone Encounter (Signed)
Patient calling to give results INR: 2.2

## 2019-03-29 ENCOUNTER — Ambulatory Visit: Payer: Medicare Other

## 2019-03-29 NOTE — Telephone Encounter (Signed)
Attempted to schedule.  No ans no vm  

## 2019-03-30 ENCOUNTER — Encounter: Payer: Self-pay | Admitting: Family Medicine

## 2019-03-31 ENCOUNTER — Ambulatory Visit: Payer: Medicare Other

## 2019-03-31 ENCOUNTER — Other Ambulatory Visit: Payer: Self-pay | Admitting: Family Medicine

## 2019-04-04 ENCOUNTER — Ambulatory Visit (INDEPENDENT_AMBULATORY_CARE_PROVIDER_SITE_OTHER): Payer: Medicare Other | Admitting: Pharmacist

## 2019-04-04 ENCOUNTER — Encounter: Payer: Self-pay | Admitting: Cardiovascular Disease

## 2019-04-04 DIAGNOSIS — I4891 Unspecified atrial fibrillation: Secondary | ICD-10-CM

## 2019-04-04 DIAGNOSIS — Z952 Presence of prosthetic heart valve: Secondary | ICD-10-CM | POA: Diagnosis not present

## 2019-04-04 DIAGNOSIS — Z5181 Encounter for therapeutic drug level monitoring: Secondary | ICD-10-CM

## 2019-04-04 LAB — POCT INR: INR: 2.1 (ref 2.0–3.0)

## 2019-04-05 ENCOUNTER — Ambulatory Visit (INDEPENDENT_AMBULATORY_CARE_PROVIDER_SITE_OTHER): Payer: Medicare Other

## 2019-04-05 ENCOUNTER — Other Ambulatory Visit: Payer: Self-pay

## 2019-04-05 ENCOUNTER — Encounter (INDEPENDENT_AMBULATORY_CARE_PROVIDER_SITE_OTHER): Payer: Self-pay | Admitting: Vascular Surgery

## 2019-04-05 ENCOUNTER — Ambulatory Visit (INDEPENDENT_AMBULATORY_CARE_PROVIDER_SITE_OTHER): Payer: Medicare Other | Admitting: Vascular Surgery

## 2019-04-05 ENCOUNTER — Ambulatory Visit: Payer: Medicare Other

## 2019-04-05 VITALS — BP 120/74 | HR 108 | Resp 16 | Ht 70.0 in | Wt 190.0 lb

## 2019-04-05 DIAGNOSIS — I4891 Unspecified atrial fibrillation: Secondary | ICD-10-CM | POA: Diagnosis not present

## 2019-04-05 DIAGNOSIS — I10 Essential (primary) hypertension: Secondary | ICD-10-CM | POA: Diagnosis not present

## 2019-04-05 DIAGNOSIS — I2510 Atherosclerotic heart disease of native coronary artery without angina pectoris: Secondary | ICD-10-CM

## 2019-04-05 DIAGNOSIS — Z992 Dependence on renal dialysis: Secondary | ICD-10-CM

## 2019-04-05 DIAGNOSIS — N186 End stage renal disease: Secondary | ICD-10-CM

## 2019-04-05 DIAGNOSIS — T829XXS Unspecified complication of cardiac and vascular prosthetic device, implant and graft, sequela: Secondary | ICD-10-CM | POA: Diagnosis not present

## 2019-04-05 DIAGNOSIS — U071 COVID-19: Secondary | ICD-10-CM

## 2019-04-05 NOTE — Assessment & Plan Note (Signed)
On anticoagulation 

## 2019-04-05 NOTE — Assessment & Plan Note (Signed)
His duplex today shows a caliber change between access sites with some mild to moderate narrowing and velocity changes but stable from his previous study without significant progression. Rotating the access sites would be helpful to avoid skin breakdown in the aneurysmal segments.  We did discuss that some point he was likely going to require a surgical revision but I hope that we will be a year or 2 down the line.  For now, we will continue 18-month follow-up intervals will be happy to see him anytime sooner if the need arises.

## 2019-04-05 NOTE — Progress Notes (Signed)
MRN : [id number]  JARELL MCEWEN is a 63 y.o. (1955-12-17) male who presents with chief complaint of  Chief Complaint  Patient presents with  . Follow-up    ultrasound  .  History of Present Illness: Patient returns today in follow up of his dialysis access.  His left arm AV fistula which is aneurysmal has been working reasonably well for his dialysis without any major issues or problems.  His access sites are quite aneurysmal and the venous access site is somewhat painful when used.  His duplex today shows a caliber change between access sites with some mild to moderate narrowing and velocity changes but stable from his previous study without significant progression.  Current Outpatient Medications  Medication Sig Dispense Refill  . acetaminophen (TYLENOL) 500 MG tablet Take 1,000 mg 2 (two) times daily as needed by mouth for moderate pain.    Marland Kitchen albuterol (VENTOLIN HFA) 108 (90 Base) MCG/ACT inhaler Inhale 2 puffs into the lungs every 4 (four) hours as needed for wheezing or shortness of breath. 6.7 g 0  . allopurinol (ZYLOPRIM) 300 MG tablet Take 300 mg by mouth daily.  5  . amiodarone (PACERONE) 200 MG tablet Take 1 tablet (200 mg total) by mouth daily. 180 tablet 3  . aspirin EC 81 MG EC tablet Take 1 tablet (81 mg total) by mouth daily.    . cinacalcet (SENSIPAR) 30 MG tablet Take 30 mg by mouth every other day.     Marland Kitchen dextromethorphan (DELSYM) 30 MG/5ML liquid Take 5 mLs (30 mg total) by mouth 2 (two) times daily. 89 mL 0  . folic acid-vitamin b complex-vitamin c-selenium-zinc (DIALYVITE) 3 MG TABS tablet Take 1 tablet by mouth daily.    Marland Kitchen guaiFENesin (MUCINEX) 600 MG 12 hr tablet Take 1 tablet (600 mg total) by mouth 2 (two) times daily as needed for cough or to loosen phlegm. 30 tablet 0  . ipratropium (ATROVENT HFA) 17 MCG/ACT inhaler Inhale 2 puffs into the lungs every 4 (four) hours. 1 Inhaler 12  . lidocaine-prilocaine (EMLA) cream Apply 1 application topically as needed (for  fistula).    . metoprolol tartrate (LOPRESSOR) 25 MG tablet TAKE 1/2 TABLETS (12.5 MG TOTAL) BY MOUTH 2 (TWO) TIMES DAILY. 30 tablet 1  . multivitamin (RENA-VIT) TABS tablet Take 1 tablet by mouth daily.    . Nutritional Supplements (FEEDING SUPPLEMENT, NEPRO CARB STEADY,) LIQD Take 237 mLs by mouth 3 (three) times daily between meals. 237 mL 3  . omeprazole (PRILOSEC) 20 MG capsule Take 20 mg by mouth daily.    . predniSONE (DELTASONE) 20 MG tablet Take 2 tablets (40 mg total) by mouth daily. 10 tablet 0  . predniSONE (DELTASONE) 5 MG tablet Take 5 mg by mouth daily with breakfast.     . sevelamer carbonate (RENVELA) 800 MG tablet Take 40,000 mg by mouth 3 (three) times daily with meals. 5 tabs per meal    . vitamin C (VITAMIN C) 500 MG tablet Take 1 tablet (500 mg total) by mouth daily. 30 tablet 0  . warfarin (COUMADIN) 1 MG tablet Take 1-2 tablets (1-2 mg total) by mouth See admin instructions. 1 mg all days , except 0.5 MG on Fridays..* OR AS DIRECTED 180 tablet 1  . zinc sulfate 220 (50 Zn) MG capsule Take 1 capsule (220 mg total) by mouth daily. 30 capsule 0  . pravastatin (PRAVACHOL) 20 MG tablet Take 1 tablet (20 mg total) by mouth every evening. 90 tablet 3  No current facility-administered medications for this visit.    Past Medical History:  Diagnosis Date  . 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 Aortic stenosis    a. 2014 s/p mech AVR ([facility])-->chronic coumadin; b. 08/2018 Echo: Mild AS/AI. Nl fxn/gradient.  . Arthritis   . Atrial fibrillation ([place])   . CAD (coronary artery disease)    a. 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 02/2018 MV [facility]): large inf, infsept, inflat infarct w/ min rev, EF 30%; c. 06/2018 Cath: LM min irregs, LAD 60p, 95/30/66m, LCX min irregs, RCA 40p, 30m, 90d, RPAV 90, RPDA 95ost; c. 09/2018 CABGx3 (LIMA->LAD, VG->D1, VG->PDA).  . Chronic anticoagulation 06/2012   a. Coumadin in the  setting of mech AVR and afib.  Marland Kitchen CPAP (continuous positive airway pressure) dependence   . Dialysis patient [facility]) 04/07/2006   Patient started HD around 1996,  had 1st transplant LLQ around 1998 until 2007 when they found renal cell cancer in transplant and both native kidneys, all were removed > went back on HD until 2nd transplant RLQ in 2014 which lasted 3 yrs; it was embolized for suspected acute rejection. Is currently on HD MWF in [place] w/ [facility] nephrology.  . ESRD (end stage renal disease) ([place])    a. 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 1999 s/p Kidney transplant; c. 2014 Bilat nephrectomy (transplanted kidney resected) in the setting of renal cell carcinoma; d. PD until 11/2017-->HD.  Marland Kitchen Heart murmur   . HFrEF (heart failure with reduced ejection fraction) ([place])    a. 07/2016 Echo: EF 55-60%; b. 02/2018 MV: EF 30%; c. 04/2018 Echo: EF 35%; d. 08/2018 Echo: EF 30-35%. Glob HK w/o rwma. Mildly reduced RV fxn. Mod to sev dil LA. Nl AoV fxn.  Marland Kitchen Hx of colonoscopy    2009 by Dr. Laural Golden. Normal except for small submucosal lipoma. No evidence of polyps or colitis.  . Hyperlipidemia   . Hypertension   . Hypogonadism in male 07/25/2015  . Ischemic cardiomyopathy    a. 07/2016 Echo: EF 55-60%; b. 02/2018 MV: EF 30%; c. 04/2018 Echo: EF 35%; d. 08/2018 Echo: EF 30-35%.  . OSA (obstructive sleep apnea)    No CPAP  . Permanent atrial fibrillation ([facility])    a. CHA2DS2VASc = 3-->Coumadin (also mech AVR); b. 06/2018 Amio started 2/2 elevated rates.  . Renal cell cancer ([place])   . Renal cell carcinoma ([place]) 08/04/2016    Past Surgical History:  Procedure Laterality Date  . AORTIC VALVE REPLACEMENT  3 /11 /2014   At [facility]    . CAPD INSERTION N/A 02/19/2017   Procedure: LAPAROSCOPIC INSERTION CONTINUOUS AMBULATORY PERITONEAL DIALYSIS  (CAPD) CATHETER;  Surgeon: Algernon Huxley, MD;  Location: [facility] ORS;  Service: General;  Laterality: N/A;  . CORONARY  ANGIOPLASTY WITH STENT PLACEMENT    . CORONARY ARTERY BYPASS GRAFT N/A 09/16/2018   Procedure: CORONARY ARTERY BYPASS GRAFT times three      with left internal mammary artery and endoharvest of right leg greater saphenous vein;  Surgeon: Melrose Nakayama, MD;  Location: [place];  Service: Open Heart Surgery;  Laterality: N/A;  . DG AV DIALYSIS  SHUNT ACCESS EXIST*L* OR     left arm  . EPIGASTRIC HERNIA REPAIR N/A 02/19/2017   Procedure: HERNIA REPAIR EPIGASTRIC ADULT;  Surgeon: Robert Bellow, MD;  Location: [facility]  ORS;  Service: General;  Laterality: N/A;  . EYE SURGERY     bilateral cataract  . HERNIA REPAIR     UMBILICAL HERNIA  . HERNIA REPAIR  02/19/2017  . INNER EAR SURGERY     20 years ago  . KIDNEY TRANSPLANT    . Peritoneal Dialysis access placement  02/19/2017  . REMOVAL OF A DIALYSIS CATHETER N/A 11/23/2017   Procedure: REMOVAL OF A PERITONEAL DIALYSIS CATHETER;  Surgeon: Algernon Huxley, MD;  Location: [facility] ORS;  Service: Vascular;  Laterality: N/A;  . RIGHT/LEFT HEART CATH AND CORONARY ANGIOGRAPHY Bilateral 06/07/2018   Procedure: RIGHT/LEFT HEART CATH AND CORONARY ANGIOGRAPHY;  Surgeon: Wellington Hampshire, MD;  Location: [facility] CV LAB;  Service: Cardiovascular;  Laterality: Bilateral;  . TEE WITHOUT CARDIOVERSION N/A 07/21/2016   Procedure: TRANSESOPHAGEAL ECHOCARDIOGRAM (TEE);  Surgeon: Wellington Hampshire, MD;  Location: [facility] ORS;  Service: Cardiovascular;  Laterality: N/A;  . TEE WITHOUT CARDIOVERSION N/A 09/16/2018   Procedure: TRANSESOPHAGEAL ECHOCARDIOGRAM (TEE);  Surgeon: Melrose Nakayama, MD;  Location: [place];  Service: Open Heart Surgery;  Laterality: N/A;     Social History   Tobacco Use  . Smoking status: Never Smoker  . Smokeless tobacco: Never Used  Substance Use Topics  . Alcohol use: No  . Drug use: No    Family History  Problem Relation Age of Onset  . Cancer Mother        pancreatic  . Heart disease Father   . Diabetes Father       Allergies  Allergen Reactions  . Statins     Other reaction(s): Arthralgias (intolerance), Myalgias (intolerance)  Pt taking pravastatin   . Sertraline Rash     REVIEW OF SYSTEMS (Negative unless checked)  Constitutional: [] Weight loss  [] Fever  [] Chills Cardiac: [] Chest pain   [] Chest pressure   [] Palpitations   [] Shortness of breath when laying flat   [] Shortness of breath at rest   [x] Shortness of breath with exertion. Vascular:  [] Pain in legs with walking   [] Pain in legs at rest   [] Pain in legs when laying flat   [] Claudication   [] Pain in feet when walking  [] Pain in feet at rest  [] Pain in feet when laying flat   [] History of DVT   [] Phlebitis   [] Swelling in legs   [] Varicose veins   [] Non-healing ulcers Pulmonary:   [] Uses home oxygen   [] Productive cough   [] Hemoptysis   [] Wheeze  [] COPD   [] Asthma Neurologic:  [] Dizziness  [] Blackouts   [] Seizures   [] History of stroke   [] History of TIA  [] Aphasia   [] Temporary blindness   [] Dysphagia   [] Weakness or numbness in arms   [] Weakness or numbness in legs Musculoskeletal:  [x] Arthritis   [] Joint swelling   [] Joint pain   [] Low back pain Hematologic:  [] Easy bruising  [] Easy bleeding   [] Hypercoagulable state   [x] Anemic   Gastrointestinal:  [] Blood in stool   [] Vomiting blood  [] Gastroesophageal reflux/heartburn   [] Abdominal pain Genitourinary:  [x] Chronic kidney disease   [] Difficult urination  [] Frequent urination  [] Burning with urination   [] Hematuria Skin:  [] Rashes   [] Ulcers   [] Wounds Psychological:  [] History of anxiety   []  History of major depression.  Physical Examination  BP 120/74 (BP Location: Right Arm)   Pulse (!) 108   Resp 16   Ht 5\' 10"  (1.778 m)   Wt 190 lb (86.2 kg)   BMI 27.26 kg/m  Gen:  WD/WN, NAD  Head: [place]/AT, No temporalis wasting. Ear/Nose/Throat: Hearing grossly intact, nares w/o erythema or drainage Eyes: Conjunctiva clear. Sclera non-icteric Neck: Supple.  Trachea midline Pulmonary:   Good air movement, no use of accessory muscles.  Cardiac: irregularly irregular Vascular: Aneurysmal access sites and a large left radiocephalic AV fistula with good thrill Vessel Right Left  Radial Palpable Palpable               Musculoskeletal: M/S 5/5 throughout.  No deformity or atrophy.  Trace lower extremity edema. Neurologic: Sensation grossly intact in extremities.  Symmetrical.  Speech is fluent.  Psychiatric: Judgment intact, Mood & affect appropriate for pt's clinical situation. Dermatologic: No rashes or ulcers noted.  No cellulitis or open wounds.       Labs Recent Results (from the past 2160 hour(s))  POCT INR     Status: None   Collection Time: 01/10/19 12:00 AM  Result Value Ref Range   INR 3.0 2.0 - 3.0    Comment: pt calling w/ results from home mdINR monitor  Lab report - scanned     Status: None   Collection Time: 01/12/19 12:00 AM  Result Value Ref Range   INR 3.0   LAB CBC W/AUTO DIFF (IA) (72094)     Status: None   Collection Time: 01/12/19 12:00 AM  Result Value Ref Range   INR 3.0   POCT INR     Status: Abnormal   Collection Time: 01/17/19 12:00 AM  Result Value Ref Range   INR 1.9 (A) 2.0 - 3.0    Comment: pt calling w/ results from home monitor  Lab report - scanned     Status: None   Collection Time: 01/17/19 12:00 AM  Result Value Ref Range   INR 1.9   Lab report - scanned     Status: None   Collection Time: 01/24/19 12:00 AM  Result Value Ref Range   INR 2.5   POCT INR     Status: None   Collection Time: 01/24/19  3:50 PM  Result Value Ref Range   INR 2.5 2.0 - 3.0  Lab report - scanned     Status: None   Collection Time: 02/01/19 12:00 AM  Result Value Ref Range   INR 1.7   POCT INR     Status: None   Collection Time: 02/07/19 12:00 AM  Result Value Ref Range   INR 2.8 2.0 - 3.0    Comment: pt calling w/ results  POCT INR     Status: None   Collection Time: 02/07/19  9:43 AM  Result Value Ref Range   INR    POCT INR      Status: None   Collection Time: 02/15/19 12:00 AM  Result Value Ref Range   INR 2.4 2.0 - 3.0    Comment: results faxed from [place]  Lab report - scanned     Status: None   Collection Time: 02/15/19 12:00 AM  Result Value Ref Range   INR 2.4   POCT INR     Status: None   Collection Time: 02/21/19 12:00 AM  Result Value Ref Range   INR 2.6 2.0 - 3.0    Comment: results faxed from [place]  Lab report - scanned     Status: None   Collection Time: 02/21/19 12:00 AM  Result Value Ref Range   INR 2.6   PSA     Status: None   Collection Time: 02/22/19  3:02 PM  Result Value  Ref Range   Prostate Specific Ag, Serum 3.3 0.0 - 4.0 ng/mL    Comment: Roche ECLIA methodology. According to the American Urological Association, Serum PSA should decrease and remain at undetectable levels after radical prostatectomy. The AUA defines biochemical recurrence as an initial PSA value 0.2 ng/mL or greater followed by a subsequent confirmatory PSA value 0.2 ng/mL or greater. Values obtained with different assay methods or kits cannot be used interchangeably. Results cannot be interpreted as absolute evidence of the presence or absence of malignant disease.   POCT INR     Status: None   Collection Time: 03/01/19 12:00 AM  Result Value Ref Range   INR 2.2 2.0 - 3.0    Comment: results faxed from [place]  Lab report - scanned     Status: None   Collection Time: 03/01/19 12:00 AM  Result Value Ref Range   INR 2.2   POCT INR     Status: None   Collection Time: 03/08/19 12:00 AM  Result Value Ref Range   INR 2.9 2.0 - 3.0    Comment: results faxed from mdINR  Lactic acid, plasma     Status: None   Collection Time: 03/15/19 10:53 AM  Result Value Ref Range   Lactic Acid, Venous 1.8 0.5 - 1.9 mmol/L    Comment: Performed at [facility], [address] [place], [place] [address]  Blood Culture (routine x 2)     Status: None   Collection Time: 03/15/19 10:53 AM   Specimen: BLOOD  Result  Value Ref Range   Specimen Description BLOOD BLOOD RIGHT FOREARM    Special Requests      BOTTLES DRAWN AEROBIC AND ANAEROBIC Blood Culture adequate volume   Culture      NO GROWTH 5 DAYS Performed at [facility], [address] [place], [place] [address]    Report Status 03/20/2019 FINAL   CBC WITH DIFFERENTIAL     Status: Abnormal   Collection Time: 03/15/19 10:53 AM  Result Value Ref Range   WBC 7.3 4.0 - 10.5 K/uL   RBC 3.32 (L) 4.22 - 5.81 MIL/uL   Hemoglobin 11.2 (L) 13.0 - 17.0 g/dL   HCT 35.6 (L) 39.0 - 52.0 %   MCV 107.2 (H) 80.0 - 100.0 fL   MCH 33.7 26.0 - 34.0 pg   MCHC 31.5 30.0 - 36.0 g/dL   RDW 14.6 11.5 - 15.5 %   Platelets 124 (L) 150 - 400 K/uL   nRBC 0.0 0.0 - 0.2 %   Neutrophils Relative % 74 %   Neutro Abs 5.4 1.7 - 7.7 K/uL   Lymphocytes Relative 21 %   Lymphs Abs 1.5 0.7 - 4.0 K/uL   Monocytes Relative 5 %   Monocytes Absolute 0.4 0.1 - 1.0 K/uL   Eosinophils Relative 0 %   Eosinophils Absolute 0.0 0.0 - 0.5 K/uL   Basophils Relative 0 %   Basophils Absolute 0.0 0.0 - 0.1 K/uL   Immature Granulocytes 0 %   Abs Immature Granulocytes 0.03 0.00 - 0.07 K/uL    Comment: Performed at [facility], [address] [place],  [address]  Comprehensive metabolic panel     Status: Abnormal   Collection Time: 03/15/19 10:53 AM  Result Value Ref Range   Sodium 139 135 - 145 mmol/L    Comment: ELECTROLYTES REPEATED.[facility] KitchenMar[facility] KitchenMid [facility] P[facility]   Potassium 6.0 (H) 3.5 - 5.1 mmol/L   Chloride 95 (L) 98 - 111 mmol/L   CO2 21 (  L) 22 - 32 mmol/L   Glucose, Bld 106 (H) 70 - 99 mg/dL   BUN 76 (H) 8 - 23 mg/dL   Creatinine, Ser 14.36 (H) 0.61 - 1.24 mg/dL   Calcium 8.4 (L) 8.9 - 10.3 mg/dL   Total Protein 8.0 6.5 - 8.1 g/dL   Albumin 3.5 3.5 - 5.0 g/dL   AST 29 15 - 41 U/L   ALT 21 0 - 44 U/L   Alkaline Phosphatase 167 (H) 38 - 126 U/L   Total Bilirubin 1.4 (H) 0.3 - 1.2 mg/dL   GFR calc non Af Amer 3 (L) >60 mL/min   GFR calc Af Amer 4 (L) >60 mL/min    Anion gap 23 (H) 5 - 15    Comment: Performed at [facility], [address] [place], [place] [address]  Fibrin derivatives D-Dimer     Status: Abnormal   Collection Time: 03/15/19 10:53 AM  Result Value Ref Range   Fibrin derivatives D-dimer ([facility]) 1,091.47 (H) 0.00 - 499.00 ng/mL (FEU)    Comment: (NOTE) <> Exclusion of Venous Thromboembolism (VTE) - OUTPATIENT ONLY   (Emergency Department or [place])   0-499 ng/ml (FEU): With a low to intermediate pretest probability                      for VTE this test result excludes the diagnosis                      of VTE.   >499 ng/ml (FEU) : VTE not excluded; additional work up for VTE is                      required. <> Testing on Inpatients and Evaluation of Disseminated Intravascular   Coagulation (DIC) Reference Range:   0-499 ng/ml (FEU) Performed at [facility], [address] [place], [place] [address]   Procalcitonin     Status: None   Collection Time: 03/15/19 10:53 AM  Result Value Ref Range   Procalcitonin 0.80 ng/mL    Comment:        Interpretation: PCT > 0.5 ng/mL and <= 2 ng/mL: Systemic infection (sepsis) is possible, but other conditions are known to elevate PCT as well. (NOTE)       Sepsis PCT Algorithm           Lower Respiratory Tract                                      Infection PCT Algorithm    ----------------------------     ----------------------------         PCT < 0.25 ng/mL                PCT < 0.10 ng/mL         Strongly encourage             Strongly discourage   discontinuation of antibiotics    initiation of antibiotics    ----------------------------     -----------------------------       PCT 0.25 - 0.50 ng/mL            PCT 0.10 - 0.25 ng/mL               OR       >80% decrease in PCT  Discourage initiation of                                            antibiotics      Encourage discontinuation           of antibiotics    ----------------------------      -----------------------------         PCT >= 0.50 ng/mL              PCT 0.26 - 0.50 ng/mL                AND       <80% decrease in PCT             Encourage initiation of                                             antibiotics       Encourage continuation           of antibiotics    ----------------------------     -----------------------------        PCT >= 0.50 ng/mL                  PCT > 0.50 ng/mL               AND         increase in PCT                  Strongly encourage                                      initiation of antibiotics    Strongly encourage escalation           of antibiotics                                     -----------------------------                                           PCT <= 0.25 ng/mL                                                 OR                                        > 80% decrease in PCT                                     Discontinue / Do not initiate  antibiotics Performed at [facility], [place], [place] [address]   Lactate dehydrogenase     Status: Abnormal   Collection Time: 03/15/19 10:53 AM  Result Value Ref Range   LDH 259 (H) 98 - 192 U/L    Comment: Performed at [facility], [address] [place], [place] [address]  Ferritin     Status: Abnormal   Collection Time: 03/15/19 10:53 AM  Result Value Ref Range   Ferritin 1,828 (H) 24 - 336 ng/mL    Comment: Performed at [facility], [address] [place], [place] [address]  Triglycerides     Status: Abnormal   Collection Time: 03/15/19 10:53 AM  Result Value Ref Range   Triglycerides 179 (H) <150 mg/dL    Comment: Performed at [facility], [address] [place], [place] [address]  Fibrinogen     Status: Abnormal   Collection Time: 03/15/19 10:53 AM  Result Value Ref Range   Fibrinogen 692 (H) 210 - 475 mg/dL    Comment: Performed at [facility], [address], [place], [place] [address]  ABO/Rh     Status: None   Collection Time: 03/15/19 10:53 AM  Result Value Ref Range   ABO/RH(D)      O POS Performed at [facility], [address] [place], [place] [address]   Blood Culture (routine x 2)     Status: None   Collection Time: 03/15/19 10:54 AM   Specimen: BLOOD  Result Value Ref Range   Specimen Description BLOOD RIGHT WRIST    Special Requests      BOTTLES DRAWN AEROBIC AND ANAEROBIC Blood Culture adequate volume   Culture      NO GROWTH 5 DAYS Performed at [facility], [address] [place], [place] [address]    Report Status 03/20/2019 FINAL   C-reactive protein     Status: Abnormal   Collection Time: 03/15/19 10:54 AM  Result Value Ref Range   CRP 20.8 (H) <1.0 mg/dL    Comment: Performed at [facility], [place] [address], [place], [place] [address]  POC SARS Coronavirus 2 Ag     Status: Abnormal   Collection Time: 03/15/19 11:42 AM  Result Value Ref Range   SARS Coronavirus 2 Ag POSITIVE (A) NEGATIVE    Comment: (NOTE) SARS-CoV-2 antigen PRESENT. Positive results indicate the presence of viral antigens, but clinical correlation with patient history and other diagnostic information is necessary to determine patient infection status.  Positive results do not rule out bacterial infection or co-infection  with other viruses. False positive results are rare but can occur, and confirmatory RT-PCR testing may be appropriate in some circumstances. The expected result is Negative. Fact Sheet for Patients: PodPark.tn Fact Sheet for Providers: GiftContent.is  This test is not yet approved or cleared by the [place] FDA and  has been authorized for detection and/or diagnosis of SARS-CoV-2 by FDA under an Emergency Use Authorization (EUA).  This EUA will remain in effect (meaning this test can be used) for the duration of  the COVID-19  declaration under Section 564(b)(1) of the Act, 21 U.S.C. section 360bbb-3(b)(1), unless the a uthorization is terminated or revoked sooner.   Lactic acid, plasma     Status: None   Collection Time: 03/15/19  1:17 PM  Result Value Ref Range   Lactic Acid, Venous 1.1 0.5 - 1.9 mmol/L    Comment: Performed at [facility], [address] [place], [name] [address]  Glucose, capillary     Status: None   Collection Time: 03/15/19  1:57 PM  Result Value Ref Range   Glucose-Capillary 86 70 - 99 mg/dL  Type and screen [place]     Status: None   Collection Time: 03/15/19  2:48 PM  Result Value Ref Range   ABO/RH(D) O POS    Antibody Screen NEG    Sample Expiration      03/18/2019,2359 Performed at [facility], [address] [place], [place] [address]   Brain natriuretic peptide     Status: Abnormal   Collection Time: 03/15/19  2:48 PM  Result Value Ref Range   B Natriuretic Peptide 1,821.0 (H) 0.0 - 100.0 pg/mL    Comment: Performed at [facility], [address] [place], [place] [address]  Hepatitis B surface antigen     Status: None   Collection Time: 03/15/19  2:48 PM  Result Value Ref Range   Hepatitis B Surface Ag NON REACTIVE NON REACTIVE    Comment: Performed at [place] [address], [place], [place] [address]  Troponin I (High Sensitivity)     Status: Abnormal   Collection Time: 03/15/19  2:48 PM  Result Value Ref Range   Troponin I (High Sensitivity) 380 (HH) <18 ng/L    Comment: CRITICAL RESULT CALLED TO, READ BACK BY AND VERIFIED WITH GEORGIE Defenbaugh 03/15/19 @ 3557  [place] (NOTE) Elevated high sensitivity troponin I (hsTnI) values and significant  changes across serial measurements may suggest ACS but many other  chronic and acute conditions are known to elevate hsTnI results.  Refer to the "Links" section for chest pain algorithms and additional  guidance. Performed at [facility], [address] [place], [place] [address]   Protime-INR     Status: Abnormal   Collection Time: 03/15/19  2:48 PM  Result Value Ref Range   Prothrombin Time 38.4 (H) 11.4 - 15.2 seconds   INR 3.9 (H) 0.8 - 1.2    Comment: (NOTE) INR goal varies based on device and disease states. Performed at [facility], [address] [place], [place] [address]   Troponin I (High Sensitivity)     Status: Abnormal   Collection Time: 03/15/19  5:24 PM  Result Value Ref Range   Troponin I (High Sensitivity) 503 (HH) <18 ng/L    Comment: CRITICAL VALUE NOTED. VALUE IS CONSISTENT WITH PREVIOUSLY REPORTED/CALLED VALUE KLW (NOTE) Elevated high sensitivity troponin I (hsTnI) values and significant  changes across serial measurements may suggest ACS but many other  chronic and acute conditions are known to elevate hsTnI results.  Refer to the "Links" section for chest pain algorithms and additional  guidance. Performed at [facility], [address] [place], [place] [address]   CBC with Differential/Platelet     Status: Abnormal   Collection Time: 03/16/19  6:15 AM  Result Value Ref Range   WBC 5.8 4.0 - 10.5 K/uL   RBC 3.27 (L) 4.22 - 5.81 MIL/uL   Hemoglobin 10.9 (L) 13.0 - 17.0 g/dL   HCT 33.7 (L) 39.0 - 52.0 %   MCV 103.1 (H) 80.0 - 100.0 fL   MCH 33.3 26.0 - 34.0 pg   MCHC 32.3 30.0 - 36.0 g/dL   RDW 14.6 11.5 - 15.5 %   Platelets 121 (L) 150 - 400 K/uL    Comment: Immature Platelet Fraction may be clinically indicated, consider  ordering this additional test [id number]    nRBC 0.0 0.0 - 0.2 %   Neutrophils Relative % 85 %   Neutro Abs 5.0 1.7 - 7.7 K/uL   Lymphocytes Relative 10 %   Lymphs Abs 0.6 (L) 0.7 - 4.0 K/uL   Monocytes Relative 4 %   Monocytes Absolute 0.2 0.1 - 1.0 K/uL   Eosinophils Relative 0 %   Eosinophils Absolute 0.0 0.0 - 0.5 K/uL   Basophils Relative 0 %   Basophils Absolute 0.0 0.0 - 0.1 K/uL   Immature Granulocytes 1 %   Abs Immature  Granulocytes 0.04 0.00 - 0.07 K/uL    Comment: Performed at [facility], [address] [place], [place] [address]  Comprehensive metabolic panel     Status: Abnormal   Collection Time: 03/16/19  6:15 AM  Result Value Ref Range   Sodium 139 135 - 145 mmol/L   Potassium 5.5 (H) 3.5 - 5.1 mmol/L   Chloride 93 (L) 98 - 111 mmol/L   CO2 27 22 - 32 mmol/L   Glucose, Bld 160 (H) 70 - 99 mg/dL   BUN 57 (H) 8 - 23 mg/dL   Creatinine, Ser 10.25 (H) 0.61 - 1.24 mg/dL   Calcium 8.0 (L) 8.9 - 10.3 mg/dL   Total Protein 7.7 6.5 - 8.1 g/dL   Albumin 3.2 (L) 3.5 - 5.0 g/dL   AST 35 15 - 41 U/L   ALT 20 0 - 44 U/L   Alkaline Phosphatase 154 (H) 38 - 126 U/L   Total Bilirubin 1.1 0.3 - 1.2 mg/dL   GFR calc non Af Amer 5 (L) >60 mL/min   GFR calc Af Amer 6 (L) >60 mL/min   Anion gap 19 (H) 5 - 15    Comment: Performed at [facility], [address] [place], [place] [address]  C-reactive protein     Status: Abnormal   Collection Time: 03/16/19  6:15 AM  Result Value Ref Range   CRP 26.8 (H) <1.0 mg/dL    Comment: Performed at [facility], [address] [place], [place] [address]  Fibrin derivatives D-Dimer [facility] only)     Status: Abnormal   Collection Time: 03/16/19  6:15 AM  Result Value Ref Range   Fibrin derivatives D-dimer ([facility]) 896.23 (H) 0.00 - 499.00 ng/mL (FEU)    Comment: (NOTE) <> Exclusion of Venous Thromboembolism (VTE) - OUTPATIENT ONLY   (Emergency Department or [place])   0-499 ng/ml (FEU): With a low to intermediate pretest probability                      for VTE this test result excludes the diagnosis                      of VTE.   >499 ng/ml (FEU) : VTE not excluded; additional work up for VTE is                      required. <> Testing on Inpatients and Evaluation of Disseminated Intravascular   Coagulation (DIC) Reference Range:   0-499 ng/ml (FEU) Performed at [facility], [address] [place],  [address]     Ferritin     Status: Abnormal   Collection Time: 03/16/19  6:15 AM  Result Value Ref Range   Ferritin 2,070 (H) 24 - 336 ng/mL    Comment: Performed at [facility], [address] [place], [name] [address]  Magnesium     Status: Abnormal   Collection Time: 03/16/19  6:15 AM  Result Value Ref Range   Magnesium 2.5 (H) 1.7 - 2.4 mg/dL    Comment: Performed at [facility], [address] [place], [place] [address]  Protime-INR     Status: Abnormal   Collection Time: 03/16/19  6:15 AM  Result Value Ref Range   Prothrombin Time 40.4 (H) 11.4 - 15.2 seconds   INR 4.2 (HH) 0.8 - 1.2    Comment: CRITICAL RESULT CALLED TO, READ BACK BY AND VERIFIED WITH: BETH RICHARDSON AT 0750 03/16/19 HASSANR (NOTE) INR goal varies based on device and disease states. Performed at [facility], [address] [place], [place] [address]   CBC with Differential/Platelet     Status: Abnormal   Collection Time: 03/17/19  6:45 AM  Result Value Ref Range   WBC 9.2 4.0 - 10.5 K/uL   RBC 3.11 (L) 4.22 - 5.81 MIL/uL   Hemoglobin 10.8 (L) 13.0 - 17.0 g/dL   HCT 33.3 (L) 39.0 - 52.0 %   MCV 107.1 (H) 80.0 - 100.0 fL   MCH 34.7 (H) 26.0 - 34.0 pg   MCHC 32.4 30.0 - 36.0 g/dL   RDW 14.5 11.5 - 15.5 %   Platelets 121 (L) 150 - 400 K/uL    Comment: Immature Platelet Fraction may be clinically indicated, consider ordering this additional test [id number]    nRBC 0.0 0.0 - 0.2 %   Neutrophils Relative % 90 %   Neutro Abs 8.3 (H) 1.7 - 7.7 K/uL   Lymphocytes Relative 7 %   Lymphs Abs 0.7 0.7 - 4.0 K/uL   Monocytes Relative 3 %   Monocytes Absolute 0.3 0.1 - 1.0 K/uL   Eosinophils Relative 0 %   Eosinophils Absolute 0.0 0.0 - 0.5 K/uL   Basophils Relative 0 %   Basophils Absolute 0.0 0.0 - 0.1 K/uL   Immature Granulocytes 0 %   Abs Immature Granulocytes 0.04 0.00 - 0.07 K/uL    Comment: Performed at [facility], [address] [place], [place] [address]   Comprehensive metabolic panel     Status: Abnormal   Collection Time: 03/17/19  6:45 AM  Result Value Ref Range   Sodium 138 135 - 145 mmol/L   Potassium 5.5 (H) 3.5 - 5.1 mmol/L   Chloride 92 (L) 98 - 111 mmol/L   CO2 27 22 - 32 mmol/L   Glucose, Bld 163 (H) 70 - 99 mg/dL   BUN 93 (H) 8 - 23 mg/dL   Creatinine, Ser 12.19 (H) 0.61 - 1.24 mg/dL   Calcium 8.2 (L) 8.9 - 10.3 mg/dL   Total Protein 7.3 6.5 - 8.1 g/dL   Albumin 3.2 (L) 3.5 - 5.0 g/dL   AST 38 15 - 41 U/L   ALT 18 0 - 44 U/L   Alkaline Phosphatase 143 (H) 38 - 126 U/L   Total Bilirubin 1.3 (H) 0.3 - 1.2 mg/dL   GFR calc non Af Amer 4 (L) >60 mL/min   GFR calc Af Amer 5 (L) >60 mL/min   Anion gap 19 (H) 5 - 15    Comment: Performed at [facility], [address] [place],  [address]  C-reactive protein     Status: Abnormal   Collection Time: 03/17/19  6:45 AM  Result Value Ref Range   CRP 17.7 (H) <1.0 mg/dL    Comment: Performed at [facility], [place] [address], [place], [place] [address]  Fibrin derivatives D-Dimer [facility] only)     Status: Abnormal   Collection Time: 03/17/19  6:45 AM  Result Value Ref Range   Fibrin derivatives D-dimer ([facility]) 847.75 (H) 0.00 - 499.00 ng/mL (FEU)    Comment: (NOTE) <> Exclusion of Venous Thromboembolism (VTE) - OUTPATIENT ONLY   (Emergency Department or [place])   0-499 ng/ml (FEU): With a low to intermediate pretest probability                      for VTE this test result excludes the diagnosis                      of VTE.   >499 ng/ml (FEU) : VTE not excluded; additional work up for VTE is                      required. <> Testing on Inpatients and Evaluation of Disseminated Intravascular   Coagulation (DIC) Reference Range:   0-499 ng/ml (FEU) Performed at [facility], [address] [place], [place] [address]   Ferritin     Status: Abnormal   Collection Time: 03/17/19  6:45 AM  Result Value Ref Range   Ferritin 2,244 (H) 24 - 336 ng/mL     Comment: Performed at [facility], [address], [place], [place] [address]  Magnesium     Status: Abnormal   Collection Time: 03/17/19  6:45 AM  Result Value Ref Range   Magnesium 2.6 (H) 1.7 - 2.4 mg/dL    Comment: Performed at [facility], [address] [place], [place] [address]  Protime-INR     Status: Abnormal   Collection Time: 03/17/19  6:45 AM  Result Value Ref Range   Prothrombin Time 32.5 (H) 11.4 - 15.2 seconds   INR 3.2 (H) 0.8 - 1.2    Comment: (NOTE) INR goal varies based on device and disease states. Performed at [facility], [address] [place], [place] [address]   Phosphorus     Status: Abnormal   Collection Time: 03/17/19  6:45 AM  Result Value Ref Range   Phosphorus 6.6 (H) 2.5 - 4.6 mg/dL    Comment: Performed at [facility], [address] [place], [place] [address]  Phosphorus     Status: Abnormal   Collection Time: 03/17/19  6:45 AM  Result Value Ref Range   Phosphorus 6.1 (H) 2.5 - 4.6 mg/dL    Comment: Performed at [facility], [address] [place], [place] [address]  Comprehensive metabolic panel     Status: Abnormal   Collection Time: 03/18/19  5:11 AM  Result Value Ref Range   Sodium 139 135 - 145 mmol/L   Potassium 4.5 3.5 - 5.1 mmol/L   Chloride 92 (L) 98 - 111 mmol/L   CO2 27 22 - 32 mmol/L   Glucose, Bld 147 (H) 70 - 99 mg/dL   BUN 78 (H) 8 - 23 mg/dL   Creatinine, Ser 9.23 (H) 0.61 - 1.24 mg/dL   Calcium 7.8 (L) 8.9 - 10.3 mg/dL   Total Protein 7.2 6.5 - 8.1 g/dL   Albumin 3.0 (L) 3.5 - 5.0 g/dL   AST 36 15 - 41 U/L   ALT 20 0 - 44 U/L   Alkaline Phosphatase 140 (H) 38 - 126 U/L   Total Bilirubin 1.1 0.3 - 1.2 mg/dL  GFR calc non Af Amer 5 (L) >60 mL/min   GFR calc Af Amer 6 (L) >60 mL/min   Anion gap 20 (H) 5 - 15    Comment: Performed at [facility], [address] [place], [place] [address]  C-reactive protein     Status: Abnormal   Collection Time:  03/18/19  5:11 AM  Result Value Ref Range   CRP 13.4 (H) <1.0 mg/dL    Comment: Performed at [facility] [address] [place], [place] [address]  Fibrin derivatives D-Dimer [facility] only)     Status: Abnormal   Collection Time: 03/18/19  5:11 AM  Result Value Ref Range   Fibrin derivatives D-dimer ([facility]) 728.48 (H) 0.00 - 499.00 ng/mL (FEU)    Comment: (NOTE) <> Exclusion of Venous Thromboembolism (VTE) - OUTPATIENT ONLY   (Emergency Department or [place])   0-499 ng/ml (FEU): With a low to intermediate pretest probability                      for VTE this test result excludes the diagnosis                      of VTE.   >499 ng/ml (FEU) : VTE not excluded; additional work up for VTE is                      required. <> Testing on Inpatients and Evaluation of Disseminated Intravascular   Coagulation (DIC) Reference Range:   0-499 ng/ml (FEU) Performed at [facility], [address] [place], [place] [address]   Ferritin     Status: Abnormal   Collection Time: 03/18/19  5:11 AM  Result Value Ref Range   Ferritin 1,993 (H) 24 - 336 ng/mL    Comment: Performed at [facility], [address] [place], [place] [address]  Protime-INR     Status: Abnormal   Collection Time: 03/18/19  5:11 AM  Result Value Ref Range   Prothrombin Time 26.2 (H) 11.4 - 15.2 seconds   INR 2.4 (H) 0.8 - 1.2    Comment: (NOTE) INR goal varies based on device and disease states. Performed at [facility], [address] [place], [place] [address]   Comprehensive metabolic panel     Status: Abnormal   Collection Time: 03/19/19  5:00 AM  Result Value Ref Range   Sodium 139 135 - 145 mmol/L   Potassium 5.1 3.5 - 5.1 mmol/L   Chloride 95 (L) 98 - 111 mmol/L   CO2 19 (L) 22 - 32 mmol/L   Glucose, Bld 161 (H) 70 - 99 mg/dL   BUN 118 (H) 8 - 23 mg/dL    Comment: RESULT CONFIRMED BY MANUAL DILUTION.PMF   Creatinine, Ser 10.91 (H) 0.61 - 1.24 mg/dL   Calcium 8.0 (L) 8.9  - 10.3 mg/dL   Total Protein 7.1 6.5 - 8.1 g/dL   Albumin 3.2 (L) 3.5 - 5.0 g/dL   AST 31 15 - 41 U/L   ALT 22 0 - 44 U/L   Alkaline Phosphatase 151 (H) 38 - 126 U/L   Total Bilirubin 1.4 (H) 0.3 - 1.2 mg/dL   GFR calc non Af Amer 4 (L) >60 mL/min   GFR calc Af Amer 5 (L) >60 mL/min   Anion gap 25 (H) 5 - 15    Comment: Performed at [facility], [address], [place], [place] [address]  C-reactive protein     Status: Abnormal   Collection Time: 03/19/19  5:00 AM  Result Value Ref Range   CRP 10.4 (H) <1.0 mg/dL    Comment: Performed at [facility], [address] [place], [place] [address]  Fibrin derivatives D-Dimer [facility] only)     Status: Abnormal   Collection Time: 03/19/19  5:00 AM  Result Value Ref Range   Fibrin derivatives D-dimer ([facility]) 700.06 (H) 0.00 - 499.00 ng/mL (FEU)    Comment: (NOTE) <> Exclusion of Venous Thromboembolism (VTE) - OUTPATIENT ONLY   (Emergency Department or [place])   0-499 ng/ml (FEU): With a low to intermediate pretest probability                      for VTE this test result excludes the diagnosis                      of VTE.   >499 ng/ml (FEU) : VTE not excluded; additional work up for VTE is                      required. <> Testing on Inpatients and Evaluation of Disseminated Intravascular   Coagulation (DIC) Reference Range:   0-499 ng/ml (FEU) Performed at [facility], [address] [place], [place] [address]   Ferritin     Status: Abnormal   Collection Time: 03/19/19  5:00 AM  Result Value Ref Range   Ferritin 1,651 (H) 24 - 336 ng/mL    Comment: Performed at [facility], [address] [place], [place] [address]  Protime-INR     Status: Abnormal   Collection Time: 03/19/19  5:00 AM  Result Value Ref Range   Prothrombin Time 30.1 (H) 11.4 - 15.2 seconds   INR 2.9 (H) 0.8 - 1.2    Comment: (NOTE) INR goal varies based on device and disease states. Performed at [facility], [address] [place], [place] [address]   POCT INR     Status: Abnormal   Collection Time: 03/23/19 12:00 AM  Result Value Ref Range   INR 8.0 (A) 2.0 - 3.0    Comment: pt calling w/ results from home mdINR machine  POCT INR     Status: Abnormal   Collection Time: 03/23/19  8:46 AM  Result Value Ref Range   INR 8.0 (A) 2.0 - 3.0  POCT INR     Status: Abnormal   Collection Time: 03/25/19 12:00 AM  Result Value Ref Range   INR 3.8 (A) 2.0 - 3.0    Comment: self tester  POCT INR     Status: None   Collection Time: 03/28/19 12:00 AM  Result Value Ref Range   INR 2.2 2.0 - 3.0    Comment: pt calling w/ results from home machine  POCT INR     Status: None   Collection Time: 03/28/19 10:37 AM  Result Value Ref Range   INR 2.2 2.0 - 3.0  POCT INR     Status: None   Collection Time: 04/04/19 12:00 AM  Result Value Ref Range   INR 2.1 2.0 - 3.0    Comment: self tester    Radiology DG Chest Port 1 View  Result Date: 03/15/2019 CLINICAL DATA:  Shortness of breath and weakness. Fever. COVID-19 positive. EXAM: PORTABLE CHEST 1 VIEW COMPARISON:  Chest x-ray dated 11/10/2018 and CT scan dated 03/02/2019 FINDINGS: The patient  has developed extensive faint bilateral pulmonary infiltrates. Chronic cardiomegaly. Pulmonary vascularity is at the upper limits of normal. No effusions. No acute bone abnormality. CABG. IMPRESSION: 1. New extensive faint bilateral pulmonary infiltrates since the prior CT scan. 2. Chronic cardiomegaly. 3. Pulmonary vascularity at upper limits of normal. Electronically Signed   By: Lorriane Shire M.D.   On: 03/15/2019 11:15    Assessment/Plan  COVID-19 Earlier this year.  Now recovered without residual effects  Essential hypertension blood pressure control important in reducing the progression of atherosclerotic disease. On appropriate oral medications.   Atrial fibrillation with RVR (HCC) On anticoagulation  ESRD on dialysis [facility]) His duplex today shows a  caliber change between access sites with some mild to moderate narrowing and velocity changes but stable from his previous study without significant progression. Rotating the access sites would be helpful to avoid skin breakdown in the aneurysmal segments.  We did discuss that some point he was likely going to require a surgical revision but I hope that we will be a year or 2 down the line.  For now, we will continue 51-month follow-up intervals will be happy to see him anytime sooner if the need arises.    Leotis Pain, MD  04/05/2019 10:28 AM    This note was created with Dragon medical transcription system.  Any errors from dictation are purely unintentional

## 2019-04-05 NOTE — Assessment & Plan Note (Signed)
blood pressure control important in reducing the progression of atherosclerotic disease. On appropriate oral medications.  

## 2019-04-05 NOTE — Assessment & Plan Note (Signed)
Earlier this year.  Now recovered without residual effects

## 2019-04-07 ENCOUNTER — Ambulatory Visit: Payer: Medicare Other

## 2019-04-11 ENCOUNTER — Ambulatory Visit (INDEPENDENT_AMBULATORY_CARE_PROVIDER_SITE_OTHER): Payer: Medicare Other

## 2019-04-11 ENCOUNTER — Encounter: Payer: Self-pay | Admitting: Cardiovascular Disease

## 2019-04-11 DIAGNOSIS — I4891 Unspecified atrial fibrillation: Secondary | ICD-10-CM | POA: Diagnosis not present

## 2019-04-11 DIAGNOSIS — Z952 Presence of prosthetic heart valve: Secondary | ICD-10-CM

## 2019-04-11 DIAGNOSIS — I4821 Permanent atrial fibrillation: Secondary | ICD-10-CM

## 2019-04-11 DIAGNOSIS — Z5181 Encounter for therapeutic drug level monitoring: Secondary | ICD-10-CM | POA: Diagnosis not present

## 2019-04-11 LAB — POCT INR: INR: 2.7 (ref 2.0–3.0)

## 2019-04-11 NOTE — Progress Notes (Signed)
Pt is very interested in Multimedia programmer. He has an appt w/ Christell Faith, PA later this week and will discuss if this is an option at that visit.

## 2019-04-11 NOTE — Patient Instructions (Signed)
Spoke w/ pt and advised him to continue dosage of 1 tablet daily except for 1/2 a tablet on Fridays.  Recheck INR in 1 week.

## 2019-04-12 ENCOUNTER — Ambulatory Visit: Payer: Medicare Other

## 2019-04-14 ENCOUNTER — Ambulatory Visit: Payer: Medicare Other

## 2019-04-14 LAB — BASIC METABOLIC PANEL
BUN: 20 (ref 4–21)
CO2: 30 — AB (ref 13–22)
Chloride: 96 — AB (ref 99–108)
Creatinine: 4.9 — AB (ref <=1.3)
Glucose: 98
Potassium: 4.3 (ref 3.4–5.3)
Sodium: 143 (ref 137–147)

## 2019-04-14 LAB — COMPREHENSIVE METABOLIC PANEL
Albumin: 4.3 (ref 3.5–5.0)
Calcium: 8.7 (ref 8.7–10.7)
GFR calc Af Amer: 14
GFR calc non Af Amer: 12

## 2019-04-14 LAB — HEPATIC FUNCTION PANEL
ALT: 15 (ref 10–40)
AST: 18 (ref 14–40)
Alkaline Phosphatase: 241 — AB (ref 25–125)
Bilirubin, Total: 0.5

## 2019-04-18 ENCOUNTER — Encounter: Payer: Self-pay | Admitting: Cardiovascular Disease

## 2019-04-18 ENCOUNTER — Ambulatory Visit (INDEPENDENT_AMBULATORY_CARE_PROVIDER_SITE_OTHER): Payer: Medicare Other

## 2019-04-18 DIAGNOSIS — Z5181 Encounter for therapeutic drug level monitoring: Secondary | ICD-10-CM | POA: Diagnosis not present

## 2019-04-18 DIAGNOSIS — Z952 Presence of prosthetic heart valve: Secondary | ICD-10-CM

## 2019-04-18 DIAGNOSIS — I4891 Unspecified atrial fibrillation: Secondary | ICD-10-CM

## 2019-04-18 LAB — POCT INR: INR: 2.2 (ref 2.0–3.0)

## 2019-04-18 NOTE — Patient Instructions (Signed)
Spoke w/ pt and advised him to take 2 tablets tonight, then continue dosage of 1 tablet daily except for 1/2 a tablet on Fridays.  Recheck INR in 1 week.

## 2019-04-19 ENCOUNTER — Ambulatory Visit: Payer: Medicare Other

## 2019-04-21 ENCOUNTER — Ambulatory Visit: Payer: Medicare Other

## 2019-04-22 ENCOUNTER — Other Ambulatory Visit: Payer: Self-pay | Admitting: Thoracic Surgery (Cardiothoracic Vascular Surgery)

## 2019-04-22 NOTE — Progress Notes (Signed)
Cardiology Office Note    Date:  04/26/2019   ID:  CASSIAN TORELLI, DOB 05-02-1955, MRN [id number]  PCP:  Leone Haven, MD  Cardiologist:  Kathlyn Sacramento, MD  Electrophysiologist:  None   Chief Complaint: Follow up  History of Present Illness:   Jeremy [name] is a 64 y.o. 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   Patient previously underwent PCI/DES to the LAD in 2013 with cath at that time showing nonobstructive RCA and LCx disease.  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  Patient was previously on peritoneal dialysis though was switched to hemodialysis in 11/2017 secondary to ineffective fluid removal.  In late 2019, he reported significant worsening angina with Lexiscan Myoview on 02/2018 at [facility] being high risk with a large defect in the inferior, inferoseptal, and inferolateral myocardium which was fixed and minimally reversible.  EF was 30% (prior EF normal).  He was placed on Imdur with no improvement in symptoms.  Echo in 04/2018 showed a drop in his LV systolic function with an EF of 35%, mildly reduced RV systolic function, moderate biatrial enlargement, Saint Jude mechanical aortic valve with a mean gradient 21 mmHg, mild mitral regurgitation, and mild pulmonary hypertension.  He subsequently underwent R/LHC in 06/2018 which showed severe heavily calcified two-vessel CAD involving the LAD and RCA which was not optimal for atherectomy given significant tortuosity and diffuse disease.  RHC showed mildly elevated filling pressures, moderate pulmonary  hypertension, and normal cardiac output.  There was difficulty in controlling the patient's A. fib leading to him being placed on amiodarone for rate control.  He ultimately underwent three-vessel CABG in 09/2018 by Dr. Roxan Hockey with a LIMA to LAD, SVG to D1, and SVG to PDA.  Postoperatively, he had hypotension, especially with dialysis leading to the initiation of midodrine.  He was last seen in the office in 11/2018 and was doing well with significant improvement in angina.  He was also noted to no longer be hypotensive leading to the discontinuation of midodrine.  Follow-up echo in 01/2019 showed slight improvement in his LV systolic function with an EF of 45 to 50%, moderate increased LVH, mildly reduced RV systolic function with normal cavity size, severely dilated left atrium, 21 mm Saint Jude mechanical aortic prosthesis was noted with a mean gradient within the normal range for prosthetic valve.  He was admitted to [facility] in 03/2019 with Covid pneumonia treated with remdesivir and intravenous steroids followed by prednisone taper.  He did develop some A. fib with RVR with ventricular rates in the 1 teens to 120s bpm which improved as his illness improved.  Since being diagnosed with COVID-19 he has noted ongoing fatigue with mild shortness of breath without chest pain.  Shortness of breath and fatigue are similar symptoms that led up to his recent bypass as above.  He has been afebrile.  He is tolerating hemodialysis on Mondays, Wednesdays, and Fridays.  He is hoping to get back on the renal transplant list in [place].  Recorded BPs sitting and standing, pre and post hemodialysis  show a range of 267-124 systolic.  Patient does report having episodes of systolic readings in the 58K, and even in the 80s during dialysis which has precluded fluid removal at times.  No bleeding issues with Coumadin.  He is no longer on Crestor secondary to myalgias though is tolerating pravastatin.   Labs independently  reviewed: 04/25/2019 - INR 2.2 04/2019 - AST/ALT normal, albumin 4.3, BUN 20, SCr 4.9, K+ 4.3 03/2019 - magnesium 2.6, HGB 10.8, PLT 121  Past Medical History:  Diagnosis Date  . 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 Aortic stenosis    a. 2014 s/p mech AVR ([facility])-->chronic coumadin; b. 08/2018 Echo: Mild AS/AI. Nl fxn/gradient.  . Arthritis   . Atrial fibrillation ([place])   . CAD (coronary artery disease)    a. 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 02/2018 MV [facility]): large inf, infsept, inflat infarct w/ min rev, EF 30%; c. 06/2018 Cath: LM min irregs, LAD 60p, 95/30/49m, LCX min irregs, RCA 40p, 39m, 90d, RPAV 90, RPDA 95ost; c. 09/2018 CABGx3 (LIMA->LAD, VG->D1, VG->PDA).  . Chronic anticoagulation 06/2012   a. Coumadin in the setting of mech AVR and afib.  Marland Kitchen CPAP (continuous positive airway pressure) dependence   . Dialysis patient [facility]) 04/07/2006   Patient started HD around 1996,  had 1st transplant LLQ around 1998 until 2007 when they found renal cell cancer in transplant and both native kidneys, all were removed > went back on HD until 2nd transplant RLQ in 2014 which lasted 3 yrs; it was embolized for suspected acute rejection. Is currently on HD MWF in [place] w/ [facility] nephrology.  . ESRD (end stage renal disease) ([place])    a. 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 1999 s/p Kidney transplant; c. 2014 Bilat nephrectomy (transplanted kidney resected) in the setting of renal cell carcinoma; d. PD until 11/2017-->HD.  Marland Kitchen Heart murmur   . HFrEF (heart failure with reduced ejection fraction) ([place])    a. 07/2016 Echo: EF 55-60%; b. 02/2018 MV: EF 30%; c. 04/2018 Echo: EF 35%; d. 08/2018 Echo: EF 30-35%. Glob HK w/o rwma. Mildly reduced RV fxn. Mod to sev dil LA. Nl AoV fxn.  Marland Kitchen Hx of colonoscopy    2009 by Dr. Laural Golden. Normal except for small submucosal lipoma. No evidence of polyps or colitis.  . Hyperlipidemia   .  Hypertension   . Hypogonadism in male 07/25/2015  . Ischemic cardiomyopathy    a. 07/2016 Echo: EF 55-60%; b. 02/2018 MV: EF 30%; c. 04/2018 Echo: EF 35%; d. 08/2018 Echo: EF 30-35%.  . OSA (obstructive sleep apnea)    No CPAP  . Permanent atrial fibrillation (HCC)    a. CHA2DS2VASc = 3-->Coumadin (also mech AVR); b. 06/2018 Amio started 2/2 elevated rates.  . Renal cell cancer ([place])   . Renal cell carcinoma ([place]) 08/04/2016    Past Surgical History:  Procedure Laterality Date  . AORTIC VALVE REPLACEMENT  3 /11 /2014   At [place]    . CAPD INSERTION N/A 02/19/2017   Procedure: LAPAROSCOPIC INSERTION CONTINUOUS AMBULATORY PERITONEAL DIALYSIS  (CAPD) CATHETER;  Surgeon: Algernon Huxley, MD;  Location: [facility] ORS;  Service: General;  Laterality: N/A;  . CORONARY ANGIOPLASTY WITH STENT PLACEMENT    . CORONARY ARTERY BYPASS GRAFT N/A 09/16/2018   Procedure: CORONARY ARTERY BYPASS GRAFT times three  with left internal mammary artery and endoharvest of right leg greater saphenous vein;  Surgeon: Melrose Nakayama, MD;  Location: [place];  Service: Open Heart Surgery;  Laterality: N/A;  . DG AV DIALYSIS  SHUNT ACCESS EXIST*L* OR     left arm  . EPIGASTRIC HERNIA REPAIR N/A 02/19/2017   Procedure: HERNIA REPAIR EPIGASTRIC ADULT;  Surgeon: Robert Bellow, MD;  Location: [facility] OR[facility];  Service: General;  Laterality: N/A;  . EYE SURGERY     bilateral cataract  . HERNIA REPAIR     UMBILICAL HERNIA  . HERNIA REPAIR  02/19/2017  . INNER EAR SURGERY     20 years ago  . KIDNEY TRANSPLANT    . Peritoneal Dialysis access placement  02/19/2017  . REMOVAL OF A DIALYSIS CATHETER N/A 11/23/2017   Procedure: REMOVAL OF A PERITONEAL DIALYSIS CATHETER;  Surgeon: Algernon Huxley, MD;  Location: [facility] OR[facility];  Service: Vascular;  Laterality: N/A;  . RIGHT/LEFT HEART CATH AND CORONARY ANGIOGRAPHY Bilateral 06/07/2018   Procedure: RIGHT/LEFT HEART CATH AND CORONARY  ANGIOGRAPHY;  Surgeon: Wellington Hampshire, MD;  Location: [place] [facility];  Service: Cardiovascular;  Laterality: Bilateral;  . TEE WITHOUT CARDIOVERSION N/A 07/21/2016   Procedure: TRANSESOPHAGEAL ECHOCARDIOGRAM (TEE);  Surgeon: Wellington Hampshire, MD;  Location: [facility];  Service: Cardiovascular;  Laterality: N/A;  . TEE WITHOUT CARDIOVERSION N/A 09/16/2018   Procedure: TRANSESOPHAGEAL ECHOCARDIOGRAM (TEE);  Surgeon: Melrose Nakayama, MD;  Location: [place];  Service: Open Heart Surgery;  Laterality: N/A;    Current Medications: Current Meds  Medication Sig  . acetaminophen (TYLENOL) 500 MG tablet Take 1,000 mg 2 (two) times daily as needed by mouth for moderate pain.  Marland Kitchen albuterol (VENTOLIN HFA) 108 (90 Base) MCG/ACT inhaler Inhale 2 puffs into the lungs every 4 (four) hours as needed for wheezing or shortness of breath.  . allopurinol (ZYLOPRIM) 300 MG tablet Take 300 mg by mouth daily.  Marland Kitchen amiodarone (PACERONE) 200 MG tablet Take 1 tablet (200 mg total) by mouth daily.  Marland Kitchen aspirin EC 81 MG EC tablet Take 1 tablet (81 mg total) by mouth daily.  . cinacalcet (SENSIPAR) 30 MG tablet Take 30 mg by mouth every other day.   Marland Kitchen dextromethorphan (DELSYM) 30 MG/5ML liquid Take 5 mLs (30 mg total) by mouth 2 (two) times daily.  . folic acid-vitamin b complex-vitamin c-selenium-zinc (DIALYVITE) 3 MG TABS tablet Take 1 tablet by mouth daily.  Marland Kitchen guaiFENesin (MUCINEX) 600 MG 12 hr tablet Take 1 tablet (600 mg total) by mouth 2 (two) times daily as needed for cough or to loosen phlegm.  Marland Kitchen ipratropium (ATROVENT HFA) 17 MCG/ACT inhaler Inhale 2 puffs into the lungs every 4 (four) hours.  . lidocaine-prilocaine (EMLA) cream Apply 1 application topically as needed (for fistula).  . metoprolol tartrate (LOPRESSOR) 25 MG tablet TAKE 1/2 TABLETS (12.5 MG TOTAL) BY MOUTH 2 (TWO) TIMES DAILY.  . multivitamin (RENA-VIT) TABS tablet Take 1 tablet by mouth daily.  . Nutritional Supplements (FEEDING SUPPLEMENT,  NEPRO CARB STEADY,) LIQD Take 237 mLs by mouth 3 (three) times daily between meals.  Marland Kitchen omeprazole (PRILOSEC) 20 MG capsule Take 20 mg by mouth daily.  . predniSONE (DELTASONE) 20 MG tablet Take 2 tablets (40 mg total) by mouth daily.  . predniSONE (DELTASONE) 5 MG tablet Take 5 mg by mouth daily with breakfast.   . sevelamer carbonate (RENVELA) 800 MG tablet Take 40,000 mg by mouth 3 (three) times daily with meals. 5 tabs per  meal  . vitamin C (VITAMIN C) 500 MG tablet Take 1 tablet (500 mg total) by mouth daily.  Marland Kitchen warfarin (COUMADIN) 1 MG tablet Take 1-2 tablets (1-2 mg total) by mouth See admin instructions. 1 mg all days , except 0.5 MG on Fridays..* OR AS DIRECTED  . zinc sulfate 220 (50 Zn) MG capsule Take 1 capsule (220 mg total) by mouth daily.    Allergies:   Statins and Sertraline   Social History   Socioeconomic History  . Marital status: Significant Other    Spouse name: Not on file  . Number of children: Not on file  . Years of education: Not on file  . Highest education level: High school graduate  Occupational History  . 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 Smoking status: Never Smoker  . Smokeless tobacco: Never Used  Substance and Sexual Activity  . Alcohol use: No  . Drug use: No  . Sexual activity: Not Currently    Partners: Female  Other Topics Concern  . Not on file  Social History Narrative   Single   1 daughter 68    Social Determinants of Health   Financial Resource Strain:   . Difficulty of Paying Living Expenses: Not on file  Food Insecurity:   . Worried About Charity fundraiser in the Last Year: Not on file  . Ran Out of Food in the Last Year: Not on file  Transportation Needs:   . Lack of Transportation (Medical): Not on file  . Lack of Transportation (Non-Medical): Not on file  Physical Activity:   . Days of Exercise per Week: Not on file  . Minutes of Exercise per Session: Not on file  Stress:   . Feeling of  Stress : Not on file  Social Connections: Unknown  . Frequency of Communication with Friends and Family: More than three times a week  . Frequency of Social Gatherings with Friends and Family: Once a week  . Attends Religious Services: 1 to 4 times per year  . Active Member of Clubs or Organizations: Not on file  . Attends Archivist Meetings: Not on file  . Marital Status: Living with partner     Family History:  The patient's family history includes Cancer in his mother; Diabetes in his father; Heart disease in his father.  ROS:   Review of Systems  Constitutional: Positive for malaise/fatigue. Negative for chills, diaphoresis, fever and weight loss.  HENT: Negative for congestion.   Eyes: Negative for discharge and redness.  Respiratory: Positive for shortness of breath. Negative for cough, sputum production and wheezing.   Cardiovascular: Negative for chest pain, palpitations, orthopnea, claudication, leg swelling and PND.  Gastrointestinal: Negative for abdominal pain, blood in stool, heartburn, melena, nausea and vomiting.  Musculoskeletal: Negative for falls and myalgias.  Skin: Negative for rash.  Neurological: Positive for weakness. Negative for dizziness, tingling, tremors, sensory change, speech change, focal weakness and loss of consciousness.  Endo/Heme/Allergies: Does not bruise/bleed easily.  Psychiatric/Behavioral: Negative for substance abuse. The patient is not nervous/anxious.   All other systems reviewed and are negative.    EKGs/Labs/Other Studies Reviewed:    Studies reviewed were summarized above. The additional studies were reviewed today:  2D Echo 01/2019: 1. Left ventricular ejection fraction, by visual estimation, is 45 to 50%. The left ventricle has normal function. Normal left ventricular size. There is moderately increased left ventricular hypertrophy.  2. Global right ventricle has  mildly reduced systolic function.The right ventricular size  is normal. No increase in right ventricular wall thickness.  3. Left atrial size was severely dilated.  4. A 19mm St. Jude mechanical prosthesis valve is present in the aortic position. Procedure Date: 06/15/12 Normal aortic valve prosthesis.Mean gradient within normal range for prosthetic valve  5. Indeterminate diastolic filling due to E-A fusion pattern of LV diastolic filling.  6. Aortic valve mean gradient measures 10.0 mmHg.  7. Aortic valve area, by VTI measures 1.85 cm.  8. TR signal is inadequate for assessing pulmonary artery systolic pressure.  EKG:  EKG is ordered today.  The EKG ordered today demonstrates A. fib, 83 bpm RBBB, nonspecific ST-T, unchanged from prior.  Recent Labs: 03/15/2019: B Natriuretic Peptide 1,821.0 03/17/2019: Hemoglobin 10.8; Magnesium 2.6; Platelets 121 04/14/2019: ALT 15; BUN 20; Creatinine 4.9; Potassium 4.3; Sodium 143  Recent Lipid Panel    Component Value Date/Time   CHOL 176 08/08/2016 0946   TRIG 179 (H) 03/15/2019 1053   HDL 50.50 08/08/2016 0946   CHOLHDL 3 08/08/2016 0946   VLDL 31.6 08/08/2016 0946   LDLCALC 94 08/08/2016 0946   LDLDIRECT 61.0 08/11/2017 1447    PHYSICAL EXAM:    VS:  BP 130/72 (BP Location: Left Arm, Patient Position: Sitting, Cuff Size: Normal)   Pulse 83   Ht 5\' 10"  (1.778 m)   Wt 193 lb 8 oz (87.8 kg)   SpO2 99%   BMI 27.76 kg/m   BMI: Body mass index is 27.76 kg/m.  Physical Exam  Constitutional: He is oriented to person, place, and time. He appears well-developed and well-nourished.  HENT:  Head: Normocephalic and atraumatic.  Eyes: Right eye exhibits no discharge. Left eye exhibits no discharge.  Neck: No JVD present.  Cardiovascular: Normal rate, S1 normal and S2 normal. An irregularly irregular rhythm present. Exam reveals no distant heart sounds, no friction rub, no midsystolic click and no opening snap.  Murmur heard. I/VI systolic murmur along the RUSB  Pulmonary/Chest: Effort normal and breath  sounds normal. No respiratory distress. He has no decreased breath sounds. He has no wheezes. He has no rales. He exhibits no tenderness.  Abdominal: Soft. He exhibits no distension. There is no abdominal tenderness.  Musculoskeletal:        General: No edema.     Cervical back: Normal range of motion.  Neurological: He is alert and oriented to person, place, and time.  Skin: Skin is warm and dry. No cyanosis. Nails show no clubbing.  Psychiatric: He has a normal mood and affect. His speech is normal and behavior is normal. Judgment and thought content normal.    Wt Readings from Last 3 Encounters:  04/26/19 193 lb 8 oz (87.8 kg)  04/05/19 190 lb (86.2 kg)  03/23/19 193 lb (87.5 kg)     ASSESSMENT & PLAN:   1. CAD status post CABG without angina: He is doing well without any symptoms concerning for angina.  I do suspect his underlying fatigue and dyspnea are likely related to his recent COVID-19 infection.  That said, we will update echo as outlined below.  He is on Coumadin and aspirin which will be continued.  Continue Lopressor and statin as outlined below.  Based on results of echo, given similarity in symptoms leading up to his CABG, if dyspnea persists we may need to consider repeat ischemic evaluation.  2. HFrEF secondary to likely ICM: He is euvolemic and well compensated.  Volume management per hemodialysis.  Most  recent echo from 01/2019 demonstrated slight improvement in LV systolic function with an EF of 45 to 50%.  With this, will defer referral to EP for subcutaneous ICD.  Given dyspnea and fatigue following COVID-19 infection check echo.  Continue low-dose metoprolol as outlined below.  Not currently on ACE inhibitor/ARB/spironolactone secondary to relative hypotension and underlying ESRD.  CHF education.  3. Permanent A. Fib: Ventricular rate are well controlled.  He has been managed on p.o. amiodarone for rate control secondary to hypotension associated with hemodialysis,  previously requiring midodrine.  He continues to note significant drops in blood pressure during hemodialysis and afterwards.  In this setting, we have agreed to discontinue amiodarone for rate control given his age.  He would like to remain on current dose Lopressor 12.5 mg twice daily for now and watch his heart rates in an effort to avoid significant hypotension.  If he notes significantly elevated heart rates following the discontinuation and washout of amiodarone he will notify our office.  He remains on Coumadin as outlined below and is followed by our Coumadin clinic.  No symptoms concerning for bleeding with recent stable hemoglobin.  4. Fatigue/dyspnea: Possibly in the setting of recent COVID-19 infection.  Check echo.  5. Aortic stenosis status post mechanical AVR: Stable on most recent echo as outlined above.  Continue long-term anticoagulation with warfarin per Coumadin clinic.  No symptoms of bleeding.  6. HLD: Intolerant to high intensity statin.  Tolerating pravastatin.  Check lipid panel and liver function (patient reports he is fasting).  If LDL is above goal of less than 70 on low intensity statin recommend addition of Zetia with possible referral to the lipid clinic for PCSK9 inhibitor.  7. ESRD: On HD Monday, Wednesday, Friday.  He does have some significant drops in systolic blood pressure during HD and post dialysis.  With this, we have agreed to defer escalation of metoprolol at this time.  8. COVID-19 infection: Possibly playing a role in his fatigue and dyspnea.  Check echo as outlined above.  9. Anemia of chronic disease: Hemoglobin stable.  Disposition: F/u with Dr. [name] or an APP in 6 months, sooner if needed.   Medication Adjustments/Labs and Tests Ordered: Current medicines are reviewed at length with the patient today.  Concerns regarding medicines are outlined above. Medication changes, Labs and Tests ordered today are summarized above and listed in the Patient  Instructions accessible in Encounters.   Signed, Christell Faith, PA-C 04/26/2019 9:06 AM     [facility] - [address], [place] [address] [phone]

## 2019-04-25 ENCOUNTER — Ambulatory Visit: Payer: Medicare Other | Admitting: Physician Assistant

## 2019-04-25 ENCOUNTER — Ambulatory Visit (INDEPENDENT_AMBULATORY_CARE_PROVIDER_SITE_OTHER): Payer: Medicare Other

## 2019-04-25 DIAGNOSIS — Z5181 Encounter for therapeutic drug level monitoring: Secondary | ICD-10-CM

## 2019-04-25 DIAGNOSIS — Z952 Presence of prosthetic heart valve: Secondary | ICD-10-CM | POA: Diagnosis not present

## 2019-04-25 DIAGNOSIS — I4891 Unspecified atrial fibrillation: Secondary | ICD-10-CM | POA: Diagnosis not present

## 2019-04-25 LAB — LAB REPORT - SCANNED: INR: 2.2

## 2019-04-25 LAB — POCT INR: INR: 2.2 (ref 2.0–3.0)

## 2019-04-25 NOTE — Patient Instructions (Signed)
Spoke w/ pt and advised him to take 2 tablets tonight, then START NEW DOSAGE of 1 tablet every day. Recheck INR in 1 week.

## 2019-04-26 ENCOUNTER — Ambulatory Visit (INDEPENDENT_AMBULATORY_CARE_PROVIDER_SITE_OTHER): Payer: Medicare Other | Admitting: Physician Assistant

## 2019-04-26 ENCOUNTER — Other Ambulatory Visit: Payer: Self-pay

## 2019-04-26 ENCOUNTER — Ambulatory Visit: Payer: Medicare Other

## 2019-04-26 ENCOUNTER — Encounter: Payer: Self-pay | Admitting: Physician Assistant

## 2019-04-26 VITALS — BP 130/72 | HR 83 | Ht 70.0 in | Wt 193.5 lb

## 2019-04-26 DIAGNOSIS — Z8616 Personal history of COVID-19: Secondary | ICD-10-CM

## 2019-04-26 DIAGNOSIS — N186 End stage renal disease: Secondary | ICD-10-CM

## 2019-04-26 DIAGNOSIS — R0602 Shortness of breath: Secondary | ICD-10-CM

## 2019-04-26 DIAGNOSIS — I5022 Chronic systolic (congestive) heart failure: Secondary | ICD-10-CM

## 2019-04-26 DIAGNOSIS — E785 Hyperlipidemia, unspecified: Secondary | ICD-10-CM | POA: Diagnosis not present

## 2019-04-26 DIAGNOSIS — R5383 Other fatigue: Secondary | ICD-10-CM | POA: Diagnosis not present

## 2019-04-26 DIAGNOSIS — I2510 Atherosclerotic heart disease of native coronary artery without angina pectoris: Secondary | ICD-10-CM

## 2019-04-26 DIAGNOSIS — Z992 Dependence on renal dialysis: Secondary | ICD-10-CM

## 2019-04-26 DIAGNOSIS — I4821 Permanent atrial fibrillation: Secondary | ICD-10-CM

## 2019-04-26 NOTE — Patient Instructions (Signed)
Medication Instructions:  Your physician has recommended you make the following change in your medication:  1- STOP Amiodarone.   *If you need a refill on your cardiac medications before your next appointment, please call your pharmacy*  Lab Work: Your physician recommends that you return for lab work in: TODAY - Lipid, liver.   If you have labs (blood work) drawn today and your tests are completely normal, you will receive your results only by: Marland Kitchen MyChart Message (if you have MyChart) OR . A paper copy in the mail If you have any lab test that is abnormal or we need to change your treatment, we will call you to review the results.  Testing/Procedures: Your physician has requested that you have an echocardiogram. Echocardiography is a painless test that uses sound waves to create images of your heart. It provides your doctor with information about the size and shape of your heart and how well your heart's chambers and valves are working. This procedure takes approximately one hour. There are no restrictions for this procedure. You may get an IV, if needed, to receive an ultrasound enhancing agent through to better visualize your heart.   Follow-Up: At [facility], you and your health needs are our priority.  As part of our continuing mission to provide you with exceptional heart care, we have created designated Provider Care Teams.  These Care Teams include your primary Cardiologist (physician) and Advanced Practice Providers (APPs -  Physician Assistants and Nurse Practitioners) who all work together to provide you with the care you need, when you need it.  Your next appointment:   6 month(s)  The format for your next appointment:   In Person  Provider:    You may see Kathlyn Sacramento, MD or one of the following Advanced Practice Providers on your designated Care Team:    Murray Hodgkins, NP  Christell Faith, PA-C  Marrianne Mood, PA-C   Echocardiogram An echocardiogram is a  procedure that uses painless sound waves (ultrasound) to produce an image of the heart. Images from an echocardiogram can provide important information about:  Signs of coronary artery disease (CAD).  Aneurysm detection. An aneurysm is a weak or damaged part of an artery wall that bulges out from the normal force of blood pumping through the body.  Heart size and shape. Changes in the size or shape of the heart can be associated with certain conditions, including heart failure, aneurysm, and CAD.  Heart muscle function.  Heart valve function.  Signs of a past heart attack.  Fluid buildup around the heart.  Thickening of the heart muscle.  A tumor or infectious growth around the heart valves. Tell a health care provider about:  Any allergies you have.  All medicines you are taking, including vitamins, herbs, eye drops, creams, and over-the-counter medicines.  Any blood disorders you have.  Any surgeries you have had.  Any medical conditions you have.  Whether you are pregnant or may be pregnant. What are the risks? Generally, this is a safe procedure. However, problems may occur, including:  Allergic reaction to dye (contrast) that may be used during the procedure. What happens before the procedure? No specific preparation is needed. You may eat and drink normally. What happens during the procedure?   An IV tube may be inserted into one of your veins.  You may receive contrast through this tube. A contrast is an injection that improves the quality of the pictures from your heart.  A gel will be  applied to your chest.  A wand-like tool (transducer) will be moved over your chest. The gel will help to transmit the sound waves from the transducer.  The sound waves will harmlessly bounce off of your heart to allow the heart images to be captured in real-time motion. The images will be recorded on a computer. The procedure may vary among health care providers and  hospitals. What happens after the procedure?  You may return to your normal, everyday life, including diet, activities, and medicines, unless your health care provider tells you not to do that. Summary  An echocardiogram is a procedure that uses painless sound waves (ultrasound) to produce an image of the heart.  Images from an echocardiogram can provide important information about the size and shape of your heart, heart muscle function, heart valve function, and fluid buildup around your heart.  You do not need to do anything to prepare before this procedure. You may eat and drink normally.  After the echocardiogram is completed, you may return to your normal, everyday life, unless your health care provider tells you not to do that. This information is not intended to replace advice given to you by your health care provider. Make sure you discuss any questions you have with your health care provider. Document Revised: 07/15/2018 Document Reviewed: 04/26/2016 Elsevier Patient Education  [employer].

## 2019-04-27 ENCOUNTER — Telehealth: Payer: Self-pay

## 2019-04-27 DIAGNOSIS — I2510 Atherosclerotic heart disease of native coronary artery without angina pectoris: Secondary | ICD-10-CM

## 2019-04-27 LAB — LIPID PANEL
Chol/HDL Ratio: 4.7 ratio (ref 0.0–5.0)
Cholesterol, Total: 179 mg/dL (ref 100–199)
HDL: 38 mg/dL — ABNORMAL LOW (ref >39)
LDL Chol Calc (NIH): 111 mg/dL — ABNORMAL HIGH (ref 0–99)
Triglycerides: 167 mg/dL — ABNORMAL HIGH (ref 0–149)
VLDL Cholesterol Cal: 30 mg/dL (ref 5–40)

## 2019-04-27 LAB — HEPATIC FUNCTION PANEL
ALT: 14 IU/L (ref 0–44)
AST: 18 IU/L (ref 0–40)
Albumin: 4.3 g/dL (ref 3.8–4.8)
Alkaline Phosphatase: 211 IU/L — ABNORMAL HIGH (ref 39–117)
Bilirubin Total: 0.5 mg/dL (ref 0.0–1.2)
Bilirubin, Direct: 0.18 mg/dL (ref 0.00–0.40)
Total Protein: 7.1 g/dL (ref 6.0–8.5)

## 2019-04-27 NOTE — Telephone Encounter (Signed)
Attempted to call patient. LMTCB 04/27/2019

## 2019-04-27 NOTE — Telephone Encounter (Signed)
-----   Message from Rise Mu, PA-C sent at 04/27/2019  7:13 AM EST ----- Overall, liver function is relatively normal with isolated nonspecific test which is consistent with prior readings.  He is intolerant to high intensity statin.  LDL is above goal.  Please have patient add Zetia 10 mg daily.  Decrease sugary foods and drinks.  Recommend repeat fasting lipid and liver function in 8 weeks time.  If LDL remains above goal at that time we may need to consider PCSK9 inhibitor.  If triglycerides remain elevated on a fasting sample at that time we may need to consider Vascepa.

## 2019-04-28 ENCOUNTER — Ambulatory Visit: Payer: Medicare Other

## 2019-04-29 MED ORDER — EZETIMIBE 10 MG PO TABS: 10.0000 mg | ORAL_TABLET | Freq: Every day | ORAL | 3 refills | Status: DC

## 2019-04-29 NOTE — Telephone Encounter (Signed)
Call to patient to review lab results and POC. Pt verbalized understanding of plan and is agreeable.   No further questions at this time. Confirmed scheduled appt.   Orders placed as advised.   Advised pt to call for any further questions or concerns.  Sent reminder letter to patient via mail

## 2019-05-02 ENCOUNTER — Ambulatory Visit (INDEPENDENT_AMBULATORY_CARE_PROVIDER_SITE_OTHER): Payer: Medicare Other

## 2019-05-02 DIAGNOSIS — Z952 Presence of prosthetic heart valve: Secondary | ICD-10-CM

## 2019-05-02 DIAGNOSIS — I4891 Unspecified atrial fibrillation: Secondary | ICD-10-CM | POA: Diagnosis not present

## 2019-05-02 DIAGNOSIS — Z5181 Encounter for therapeutic drug level monitoring: Secondary | ICD-10-CM

## 2019-05-02 LAB — POCT INR: INR: 2.6 (ref 2.0–3.0)

## 2019-05-02 NOTE — Patient Instructions (Signed)
Spoke w/ pt and advised him to continue dosage of 1 tablet every day. Recheck INR in 1 week.

## 2019-05-02 NOTE — Telephone Encounter (Signed)
This encounter was created in error - please disregard.

## 2019-05-03 ENCOUNTER — Ambulatory Visit: Payer: Medicare Other

## 2019-05-05 ENCOUNTER — Ambulatory Visit: Payer: Medicare Other

## 2019-05-07 ENCOUNTER — Encounter: Payer: Self-pay | Admitting: Family Medicine

## 2019-05-09 ENCOUNTER — Telehealth: Payer: Self-pay | Admitting: Cardiovascular Disease

## 2019-05-09 ENCOUNTER — Ambulatory Visit (INDEPENDENT_AMBULATORY_CARE_PROVIDER_SITE_OTHER): Payer: Medicare Other

## 2019-05-09 ENCOUNTER — Encounter: Payer: Self-pay | Admitting: Cardiovascular Disease

## 2019-05-09 DIAGNOSIS — I4891 Unspecified atrial fibrillation: Secondary | ICD-10-CM | POA: Diagnosis not present

## 2019-05-09 DIAGNOSIS — Z952 Presence of prosthetic heart valve: Secondary | ICD-10-CM

## 2019-05-09 DIAGNOSIS — Z5181 Encounter for therapeutic drug level monitoring: Secondary | ICD-10-CM

## 2019-05-09 LAB — POCT INR: INR: 2.3 (ref 2.0–3.0)

## 2019-05-09 LAB — LAB REPORT - SCANNED: INR: 2.3

## 2019-05-09 NOTE — Patient Instructions (Signed)
Spoke w/ pt and advised him to take 1.5 tablets tonight, then continue dosage of 1 tablet every day. Recheck INR in 1 week.

## 2019-05-09 NOTE — Telephone Encounter (Signed)
Patient calling in INR  INR: 2.3

## 2019-05-10 MED ORDER — TETANUS-DIPHTHERIA TOXOIDS TD 5-2 LFU IM INJ: 0.5000 mL | INJECTION | Freq: Once | INTRAMUSCULAR | 0 refills | Status: AC

## 2019-05-14 ENCOUNTER — Other Ambulatory Visit: Payer: Self-pay | Admitting: Thoracic Surgery (Cardiothoracic Vascular Surgery)

## 2019-05-16 ENCOUNTER — Telehealth: Payer: Self-pay

## 2019-05-16 ENCOUNTER — Other Ambulatory Visit (INDEPENDENT_AMBULATORY_CARE_PROVIDER_SITE_OTHER): Payer: Self-pay | Admitting: Nurse Practitioner

## 2019-05-16 ENCOUNTER — Ambulatory Visit (INDEPENDENT_AMBULATORY_CARE_PROVIDER_SITE_OTHER): Payer: Medicare Other

## 2019-05-16 DIAGNOSIS — I4891 Unspecified atrial fibrillation: Secondary | ICD-10-CM

## 2019-05-16 DIAGNOSIS — Z5181 Encounter for therapeutic drug level monitoring: Secondary | ICD-10-CM

## 2019-05-16 DIAGNOSIS — Z952 Presence of prosthetic heart valve: Secondary | ICD-10-CM

## 2019-05-16 DIAGNOSIS — L98499 Non-pressure chronic ulcer of skin of other sites with unspecified severity: Secondary | ICD-10-CM

## 2019-05-16 LAB — POCT INR: INR: 3.5 — AB (ref 2.0–3.0)

## 2019-05-16 NOTE — Telephone Encounter (Signed)
Patient calling to give INR results from the day  INR : 3.5

## 2019-05-16 NOTE — Patient Instructions (Signed)
Spoke w/ pt and advised him to have a serving of greens tonight, then continue warfarin dosage of 1 tablet every day. Recheck INR in 1 week.

## 2019-05-16 NOTE — Telephone Encounter (Signed)
Please see anti-coag note for today. 

## 2019-05-17 ENCOUNTER — Other Ambulatory Visit: Payer: Self-pay

## 2019-05-17 ENCOUNTER — Encounter (INDEPENDENT_AMBULATORY_CARE_PROVIDER_SITE_OTHER): Payer: Self-pay | Admitting: Nurse Practitioner

## 2019-05-17 ENCOUNTER — Ambulatory Visit (INDEPENDENT_AMBULATORY_CARE_PROVIDER_SITE_OTHER): Payer: Medicare Other

## 2019-05-17 ENCOUNTER — Ambulatory Visit (INDEPENDENT_AMBULATORY_CARE_PROVIDER_SITE_OTHER): Payer: Medicare Other | Admitting: Nurse Practitioner

## 2019-05-17 VITALS — BP 109/67 | HR 101 | Resp 12 | Ht 70.0 in | Wt 190.0 lb

## 2019-05-17 DIAGNOSIS — I10 Essential (primary) hypertension: Secondary | ICD-10-CM

## 2019-05-17 DIAGNOSIS — N186 End stage renal disease: Secondary | ICD-10-CM | POA: Diagnosis not present

## 2019-05-17 DIAGNOSIS — L98499 Non-pressure chronic ulcer of skin of other sites with unspecified severity: Secondary | ICD-10-CM | POA: Diagnosis not present

## 2019-05-17 DIAGNOSIS — Z992 Dependence on renal dialysis: Secondary | ICD-10-CM

## 2019-05-18 ENCOUNTER — Encounter (INDEPENDENT_AMBULATORY_CARE_PROVIDER_SITE_OTHER): Payer: Self-pay | Admitting: Nurse Practitioner

## 2019-05-18 NOTE — Progress Notes (Signed)
SUBJECTIVE:  Patient ID: Jeremy [name], male    DOB: September 14, 1955, 64 y.o.   MRN: [id number] Chief Complaint  Patient presents with  . Follow-up    U/S Follow up    HPI  Jeremy [name] is a 64 y.o. male that is referred over by his podiatrist Dr. Cleda Mccreedy due to a sore and ulceration on the side of his right foot has been present for about 2 months.  He denies any injury to the area to cause the wound.  He has been using Neosporin and a Band-Aid for treatment however it has not been resolving.  There is a very small sore on his left great toe as well however it is not quite open.  The patient does have a history of 2 renal transplants.  Currently he is back on hemodialysis.  The patient denies any rest pain like symptoms.  He does however endorse having some claudication-like symptoms especially when he walks for an extended distance or if he is doing activities that cause him to have to squat and walk for an extended time.  The patient states that it feels as if his legs are just giving out from under him.  This is been ongoing for some time.  The patient also does relate that a scan years ago showed that he had some "narrowing" in his groin.  Records review show that patient has extensive calcified atherosclerotic disease at the abdominal aorta and external iliac vasculature bilaterally.  This is based off a CT of the chest and abdomen on 02/18/2018.  Is also noted that the degree of calcification may have also caused some of his failure of his renal transplants.  Patient denies any other wounds.  He denies any rest pain like symptoms.  Today noninvasive studies show that he has noncompressible ABIs bilaterally.  Patient has audibly biphasic waveforms bilaterally however they appear to be weakly biphasic.  The patient also does have dampened toe waveforms bilaterally.  Past Medical History:  Diagnosis Date  . 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 Aortic stenosis    a. 2014 s/p mech AVR  ([facility])-->chronic coumadin; b. 08/2018 Echo: Mild AS/AI. Nl fxn/gradient.  . Arthritis   . Atrial fibrillation ([place])   . CAD (coronary artery disease)    a. 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 02/2018 MV [facility]): large inf, infsept, inflat infarct w/ min rev, EF 30%; c. 06/2018 Cath: LM min irregs, LAD 60p, 95/30/2m, LCX min irregs, RCA 40p, 12m, 90d, RPAV 90, RPDA 95ost; c. 09/2018 CABGx3 (LIMA->LAD, VG->D1, VG->PDA).  . Chronic anticoagulation 06/2012   a. Coumadin in the setting of mech AVR and afib.  Marland Kitchen CPAP (continuous positive airway pressure) dependence   . Dialysis patient [facility]) 04/07/2006   Patient started HD around 1996,  had 1st transplant LLQ around 1998 until 2007 when they found renal cell cancer in transplant and both native kidneys, all were removed > went back on HD until 2nd transplant RLQ in 2014 which lasted 3 yrs; it was embolized for suspected acute rejection. Is currently on HD MWF in [place] w/ [facility] nephrology.  . ESRD (end stage renal disease) ([place])    a. CKD 2/2 to IgA nephropathy (dx age 24); b. 1999 s/p Kidney transplant; c. 2014 Bilat nephrectomy (transplanted kidney resected) in the setting of renal cell carcinoma; d. PD until 11/2017-->HD.  Marland Kitchen Heart murmur   .  HFrEF (heart failure with reduced ejection fraction) ([place])    a. 07/2016 Echo: EF 55-60%; b. 02/2018 MV: EF 30%; c. 04/2018 Echo: EF 35%; d. 08/2018 Echo: EF 30-35%. Glob HK w/o rwma. Mildly reduced RV fxn. Mod to sev dil LA. Nl AoV fxn.  Marland Kitchen Hx of colonoscopy    2009 by Dr. Laural Golden. Normal except for small submucosal lipoma. No evidence of polyps or colitis.  . Hyperlipidemia   . Hypertension   . Hypogonadism in male 07/25/2015  . Ischemic cardiomyopathy    a. 07/2016 Echo: EF 55-60%; b. 02/2018 MV: EF 30%; c. 04/2018 Echo: EF 35%; d. 08/2018 Echo: EF 30-35%.  . OSA (obstructive sleep apnea)    No CPAP  . Permanent atrial fibrillation (HCC)    a.  CHA2DS2VASc = 3-->Coumadin (also mech AVR); b. 06/2018 Amio started 2/2 elevated rates.  . Renal cell cancer ([place])   . Renal cell carcinoma ([place]) 08/04/2016    Past Surgical History:  Procedure Laterality Date  . AORTIC VALVE REPLACEMENT  3 /11 /2014   At [place]    . CAPD INSERTION N/A 02/19/2017   Procedure: LAPAROSCOPIC INSERTION CONTINUOUS AMBULATORY PERITONEAL DIALYSIS  (CAPD) CATHETER;  Surgeon: Algernon Huxley, MD;  Location: [facility];  Service: General;  Laterality: N/A;  . CORONARY ANGIOPLASTY WITH STENT PLACEMENT    . CORONARY ARTERY BYPASS GRAFT N/A 09/16/2018   Procedure: CORONARY ARTERY BYPASS GRAFT times three      with left internal mammary artery and endoharvest of right leg greater saphenous vein;  Surgeon: Melrose Nakayama, MD;  Location: [place];  Service: Open Heart Surgery;  Laterality: N/A;  . DG AV DIALYSIS  SHUNT ACCESS EXIST*L* OR     left arm  . EPIGASTRIC HERNIA REPAIR N/A 02/19/2017   Procedure: HERNIA REPAIR EPIGASTRIC ADULT;  Surgeon: Robert Bellow, MD;  Location: [facility];  Service: General;  Laterality: N/A;  . EYE SURGERY     bilateral cataract  . HERNIA REPAIR     UMBILICAL HERNIA  . HERNIA REPAIR  02/19/2017  . INNER EAR SURGERY     20 years ago  . KIDNEY TRANSPLANT    . Peritoneal Dialysis access placement  02/19/2017  . REMOVAL OF A DIALYSIS CATHETER N/A 11/23/2017   Procedure: REMOVAL OF A PERITONEAL DIALYSIS CATHETER;  Surgeon: Algernon Huxley, MD;  Location: [facility];  Service: Vascular;  Laterality: N/A;  . RIGHT/LEFT HEART CATH AND CORONARY ANGIOGRAPHY Bilateral 06/07/2018   Procedure: RIGHT/LEFT HEART CATH AND CORONARY ANGIOGRAPHY;  Surgeon: Wellington Hampshire, MD;  Location: [place] [facility];  Service: Cardiovascular;  Laterality: Bilateral;  . TEE WITHOUT CARDIOVERSION N/A 07/21/2016   Procedure: TRANSESOPHAGEAL ECHOCARDIOGRAM (TEE);  Surgeon: Wellington Hampshire, MD;  Location: [facility];   Service: Cardiovascular;  Laterality: N/A;  . TEE WITHOUT CARDIOVERSION N/A 09/16/2018   Procedure: TRANSESOPHAGEAL ECHOCARDIOGRAM (TEE);  Surgeon: Melrose Nakayama, MD;  Location: [place];  Service: Open Heart Surgery;  Laterality: N/A;    Social History   Socioeconomic History  . Marital status: Significant Other    Spouse name: Not on file  . Number of children: Not on file  . Years of education: Not on file  . Highest education level: High school graduate  Occupational History  . 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 Smoking status: Never Smoker  . Smokeless tobacco: Never Used  Substance and Sexual Activity  .  Alcohol use: No  . Drug use: No  . Sexual activity: Not Currently    Partners: Female  Other Topics Concern  . Not on file  Social History Narrative   Single   1 daughter 46    Social Determinants of Health   Financial Resource Strain:   . Difficulty of Paying Living Expenses: Not on file  Food Insecurity:   . Worried About Charity fundraiser in the Last Year: Not on file  . Ran Out of Food in the Last Year: Not on file  Transportation Needs:   . Lack of Transportation (Medical): Not on file  . Lack of Transportation (Non-Medical): Not on file  Physical Activity:   . Days of Exercise per Week: Not on file  . Minutes of Exercise per Session: Not on file  Stress:   . Feeling of Stress : Not on file  Social Connections: Unknown  . Frequency of Communication with Friends and Family: More than three times a week  . Frequency of Social Gatherings with Friends and Family: Once a week  . Attends Religious Services: 1 to 4 times per year  . Active Member of Clubs or Organizations: Not on file  . Attends Archivist Meetings: Not on file  . Marital Status: Living with partner  Intimate Partner Violence:   . Fear of Current or Ex-Partner: Not on file  . Emotionally Abused: Not on file  . Physically Abused: Not on file  .  Sexually Abused: Not on file    Family History  Problem Relation Age of Onset  . Cancer Mother        pancreatic  . Heart disease Father   . Diabetes Father     Allergies  Allergen Reactions  . Statins     Other reaction(s): Arthralgias (intolerance), Myalgias (intolerance)  Pt taking pravastatin   . Sertraline Rash     Review of Systems   Review of Systems: Negative Unless Checked Constitutional: [] Weight loss  [] Fever  [] Chills Cardiac: [] Chest pain   []  Atrial Fibrillation  [] Palpitations   [] Shortness of breath when laying flat   [] Shortness of breath with exertion. [] Shortness of breath at rest Vascular:  [] Pain in legs with walking   [] Pain in legs with standing [] Pain in legs when laying flat   [x] Claudication    [] Pain in feet when laying flat    [] History of DVT   [] Phlebitis   [] Swelling in legs   [] Varicose veins   [x] Non-healing ulcers Pulmonary:   [] Uses home oxygen   [] Productive cough   [] Hemoptysis   [] Wheeze  [] COPD   [] Asthma Neurologic:  [] Dizziness   [] Seizures  [] Blackouts [] History of stroke   [] History of TIA  [] Aphasia   [] Temporary Blindness   [] Weakness or numbness in arm   [] Weakness or numbness in leg Musculoskeletal:   [] Joint swelling   [] Joint pain   [] Low back pain  []  History of Knee Replacement [] Arthritis [] back Surgeries  []  Spinal Stenosis    Hematologic:  [] Easy bruising  [] Easy bleeding   [] Hypercoagulable state   [x] Anemic Gastrointestinal:  [] Diarrhea   [] Vomiting  [] Gastroesophageal reflux/heartburn   [] Difficulty swallowing. [] Abdominal pain Genitourinary:  [x] Chronic kidney disease   [] Difficult urination  [] Anuric   [] Blood in urine [] Frequent urination  [] Burning with urination   [] Hematuria Skin:  [] Rashes   [x] Ulcers [] Wounds Psychological:  [] History of anxiety   []  History of major depression  []  Memory Difficulties      OBJECTIVE:  Physical Exam  BP 109/67 (BP Location: Right Arm)   Pulse (!) 101   Resp 12   Ht 5\' 10"   (1.778 m)   Wt 190 lb (86.2 kg)   BMI 27.26 kg/m   Gen: WD/WN, NAD Head: [place]/AT, No temporalis wasting.  Ear/Nose/Throat: Hearing grossly intact, nares w/o erythema or drainage Eyes: PER, EOMI, sclera nonicteric.  Neck: Supple, no masses.  No JVD.  Pulmonary:  Good air movement, no use of accessory muscles.  Cardiac: RRR Vascular:  Ulceration on side of  right foot, bilateral feet warm Vessel Right Left  Radial Palpable Palpable  Dorsalis Pedis  not palpable  not palpable  Posterior Tibial  not palpable  not palpable   Gastrointestinal: soft, non-distended. No guarding/no peritoneal signs.  Musculoskeletal: M/S 5/5 throughout.  No deformity or atrophy.  Neurologic: Pain and light touch intact in extremities.  Symmetrical.  Speech is fluent. Motor exam as listed above. Psychiatric: Judgment intact, Mood & affect appropriate for pt's clinical situation. Dermatologic: No Venous rashes. No Ulcers Noted.  No changes consistent with cellulitis. Lymph : No Cervical lymphadenopathy, no lichenification or skin changes of chronic lymphedema.       ASSESSMENT AND PLAN:  1. Ulcer, skin, non-healing, with unspecified severity (HCC) Today, the patient's noninvasive studies are not clearly indicative of atherosclerotic disease however the patient's description of symptoms in addition to previous CT scans which show extensive atherosclerotic disease, the patient may have iliac level disease with a fixed lesion.  The patient has also had multiple renal transplants which in some instances have resulted in clamp injuries to the iliac arteries.  Based on this I feel will be prudent to obtain an aortoiliac duplex before we fully rule out any vascular causes or issues with circulation due to the patient's delayed wound healing.  Patient will present back at his convenience for these noninvasive studies.  The patient is also advised that this may be a prelude to in invasive intervention, including angiogram.   We will follow-up with myself or Dr. Lucky Cowboy. - VAS US AORTA/IVC/ILIACS; Future  2. Essential hypertension Blood pressure well controlled today.  No medication changes needed.  3. ESRD on dialysis [facility]) Currently no issues with AV fistula or dialysis.   Current Outpatient Medications on File Prior to Visit  Medication Sig Dispense Refill  . acetaminophen (TYLENOL) 500 MG tablet Take 1,000 mg 2 (two) times daily as needed by mouth for moderate pain.    Marland Kitchen allopurinol (ZYLOPRIM) 300 MG tablet Take 300 mg by mouth daily.  5  . aspirin EC 81 MG EC tablet Take 1 tablet (81 mg total) by mouth daily.    . cinacalcet (SENSIPAR) 30 MG tablet Take 30 mg by mouth every other day.     . ezetimibe (ZETIA) 10 MG tablet Take 1 tablet (10 mg total) by mouth daily. 90 tablet 3  . folic acid-vitamin b complex-vitamin c-selenium-zinc (DIALYVITE) 3 MG TABS tablet Take 1 tablet by mouth daily.    Marland Kitchen lidocaine-prilocaine (EMLA) cream Apply 1 application topically as needed (for fistula).    . multivitamin (RENA-VIT) TABS tablet Take 1 tablet by mouth daily.    Marland Kitchen omeprazole (PRILOSEC) 20 MG capsule Take 20 mg by mouth daily.    . predniSONE (DELTASONE) 20 MG tablet Take 2 tablets (40 mg total) by mouth daily. 10 tablet 0  . predniSONE (DELTASONE) 5 MG tablet Take 5 mg by mouth daily with breakfast.     . sevelamer carbonate (RENVELA) 800  MG tablet Take 40,000 mg by mouth 3 (three) times daily with meals. 5 tabs per meal    . vitamin C (VITAMIN C) 500 MG tablet Take 1 tablet (500 mg total) by mouth daily. 30 tablet 0  . warfarin (COUMADIN) 1 MG tablet Take 1-2 tablets (1-2 mg total) by mouth See admin instructions. 1 mg all days , except 0.5 MG on Fridays..* OR AS DIRECTED 180 tablet 1  . zinc sulfate 220 (50 Zn) MG capsule Take 1 capsule (220 mg total) by mouth daily. 30 capsule 0  . albuterol (VENTOLIN HFA) 108 (90 Base) MCG/ACT inhaler Inhale 2 puffs into the lungs every 4 (four) hours as needed for wheezing or  shortness of breath. (Patient not taking: Reported on 05/17/2019) 6.7 g 0  . dextromethorphan (DELSYM) 30 MG/5ML liquid Take 5 mLs (30 mg total) by mouth 2 (two) times daily. (Patient not taking: Reported on 05/17/2019) 89 mL 0  . guaiFENesin (MUCINEX) 600 MG 12 hr tablet Take 1 tablet (600 mg total) by mouth 2 (two) times daily as needed for cough or to loosen phlegm. (Patient not taking: Reported on 05/17/2019) 30 tablet 0  . ipratropium (ATROVENT HFA) 17 MCG/ACT inhaler Inhale 2 puffs into the lungs every 4 (four) hours. (Patient not taking: Reported on 05/17/2019) 1 Inhaler 12  . metoprolol tartrate (LOPRESSOR) 25 MG tablet TAKE 1/2 TABLETS (12.5 MG TOTAL) BY MOUTH 2 (TWO) TIMES DAILY. (Patient not taking: Reported on 05/17/2019) 30 tablet 1  . Nutritional Supplements (FEEDING SUPPLEMENT, NEPRO CARB STEADY,) LIQD Take 237 mLs by mouth 3 (three) times daily between meals. (Patient not taking: Reported on 05/17/2019) 237 mL 3  . pravastatin (PRAVACHOL) 20 MG tablet Take 1 tablet (20 mg total) by mouth every evening. 90 tablet 3   No current facility-administered medications on file prior to visit.    There are no Patient Instructions on file for this visit. No follow-ups on file.   Kris Hartmann, NP  This note was completed with Sales executive.  Any errors are purely unintentional.

## 2019-05-24 ENCOUNTER — Ambulatory Visit (INDEPENDENT_AMBULATORY_CARE_PROVIDER_SITE_OTHER): Payer: Medicare Other

## 2019-05-24 ENCOUNTER — Other Ambulatory Visit: Payer: Self-pay

## 2019-05-24 DIAGNOSIS — Z8616 Personal history of COVID-19: Secondary | ICD-10-CM | POA: Diagnosis not present

## 2019-05-24 DIAGNOSIS — R0602 Shortness of breath: Secondary | ICD-10-CM | POA: Diagnosis not present

## 2019-05-25 ENCOUNTER — Ambulatory Visit (INDEPENDENT_AMBULATORY_CARE_PROVIDER_SITE_OTHER): Payer: Medicare Other

## 2019-05-25 ENCOUNTER — Encounter: Payer: Self-pay | Admitting: Family Medicine

## 2019-05-25 ENCOUNTER — Telehealth: Payer: Self-pay

## 2019-05-25 ENCOUNTER — Encounter: Payer: Self-pay | Admitting: Cardiovascular Disease

## 2019-05-25 ENCOUNTER — Ambulatory Visit (INDEPENDENT_AMBULATORY_CARE_PROVIDER_SITE_OTHER): Payer: Medicare Other | Admitting: Family Medicine

## 2019-05-25 DIAGNOSIS — I2510 Atherosclerotic heart disease of native coronary artery without angina pectoris: Secondary | ICD-10-CM | POA: Diagnosis not present

## 2019-05-25 DIAGNOSIS — L97509 Non-pressure chronic ulcer of other part of unspecified foot with unspecified severity: Secondary | ICD-10-CM

## 2019-05-25 DIAGNOSIS — Z952 Presence of prosthetic heart valve: Secondary | ICD-10-CM

## 2019-05-25 DIAGNOSIS — I5022 Chronic systolic (congestive) heart failure: Secondary | ICD-10-CM

## 2019-05-25 DIAGNOSIS — N186 End stage renal disease: Secondary | ICD-10-CM

## 2019-05-25 DIAGNOSIS — I4891 Unspecified atrial fibrillation: Secondary | ICD-10-CM | POA: Diagnosis not present

## 2019-05-25 DIAGNOSIS — Z992 Dependence on renal dialysis: Secondary | ICD-10-CM

## 2019-05-25 DIAGNOSIS — Z5181 Encounter for therapeutic drug level monitoring: Secondary | ICD-10-CM | POA: Diagnosis not present

## 2019-05-25 LAB — POCT INR: INR: 2.6 (ref 2.0–3.0)

## 2019-05-25 LAB — LAB REPORT - SCANNED: INR: 2.6

## 2019-05-25 LAB — PROTIME-INR

## 2019-05-25 MED ORDER — CARVEDILOL 3.125 MG PO TABS: 3.1250 mg | ORAL_TABLET | Freq: Two times a day (BID) | ORAL | 3 refills | Status: DC

## 2019-05-25 NOTE — Telephone Encounter (Signed)
LVM to schedule f/u

## 2019-05-25 NOTE — Telephone Encounter (Signed)
Call to patient to discuss echo results.   Pt verbalized understanding.   Order placed for Rx change.  Pt agreed to stop Lopressor.   Pt reports he will call to reschedule appt.  Advised pt to call for any further questions or concerns.

## 2019-05-25 NOTE — Telephone Encounter (Signed)
-----   Message from Rise Mu, PA-C sent at 05/25/2019  7:17 AM EST ----- Echo showed pump function of 40-45% with mild thickening of the heart. Reduced pump function of the right side of the heart. Dilated left and right atria. Mechanical aortic valve is noted with no narrowing and some leakiness.   Compared to prior echo, the pump function is slightly lower. Please have him change Lopressor to Coreg 3.125 mg bid. Watch for low BP on HD days. Not currently able to further escalate medical therapy secondary to BP drops with dialysis. We should move his follow up with Dr. [name] to the next month or two to reassess his symptoms.

## 2019-05-25 NOTE — Progress Notes (Signed)
Virtual Visit via video Note  This visit type was conducted due to national recommendations for restrictions regarding the COVID-19 pandemic (e.g. social distancing).  This format is felt to be most appropriate for this patient at this time.  All issues noted in this document were discussed and addressed.  No physical exam was performed (except for noted visual exam findings with Video Visits).   I connected with Jeremy [name] today at  3:30 PM EST by a video enabled telemedicine application and verified that I am speaking with the correct person using two identifiers. Location patient: home Location provider: work  Persons participating in the virtual visit: patient, provider  I discussed the limitations, risks, security and privacy concerns of performing an evaluation and management service by telephone and the availability of in person appointments. I also discussed with the patient that there may be a patient responsible charge related to this service. The patient expressed understanding and agreed to proceed.   Reason for visit: follow-up  HPI: Foot ulcer: Patient notes this has been present for a number of weeks.  He has already seen podiatry and vascular surgery.  Podiatry is working on getting him santyl ointment to use on it though his insurance is not paying for this.  No signs of infection.  A. fib: Notes he has not had too many issues with this.  No palpitations.  He had an echo yesterday that revealed some diminished pumping function on the right side of the heart and they are switching him from Lopressor to carvedilol.  He remains on Coumadin managed by his cardiologist.  He does have intermittent lightheadedness when rising from a seated position though if he waits a second or 2 that resolves quickly.  End-stage renal disease: He is in the process of being evaluated for another kidney transplant.  He does not have high hopes for being approved for this as his last one was  declined.   ROS: See pertinent positives and negatives per HPI.  Past Medical History:  Diagnosis Date  . Abnormal stress test 03/19/2018  . 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 Aortic stenosis    a. 2014 s/p mech AVR ([facility])-->chronic coumadin; b. 08/2018 Echo: Mild AS/AI. Nl fxn/gradient.  . Arthritis   . Atrial fibrillation ([place])   . CAD (coronary artery disease)    a. 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 02/2018 MV [facility]): large inf, infsept, inflat infarct w/ min rev, EF 30%; c. 06/2018 Cath: LM min irregs, LAD 60p, 95/30/83m, LCX min irregs, RCA 40p, 36m, 90d, RPAV 90, RPDA 95ost; c. 09/2018 CABGx3 (LIMA->LAD, VG->D1, VG->PDA).  . Chronic anticoagulation 06/2012   a. Coumadin in the setting of mech AVR and afib.  Marland Kitchen COVID-19 03/15/2019  . CPAP (continuous positive airway pressure) dependence   . Dialysis patient [facility]) 04/07/2006   Patient started HD around 1996,  had 1st transplant LLQ around 1998 until 2007 when they found renal cell cancer in transplant and both native kidneys, all were removed > went back on HD until 2nd transplant RLQ in 2014 which lasted 3 yrs; it was embolized for suspected acute rejection. Is currently on HD MWF in [place] w/ [facility] nephrology.  . ESRD (end stage renal disease) ([place])    a. 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 1999 s/p Kidney transplant; c. 2014 Bilat nephrectomy (transplanted kidney resected) in the setting of renal  cell carcinoma; d. PD until 11/2017-->HD.  Marland Kitchen Heart murmur   . HFrEF (heart failure with reduced ejection fraction) ([place])    a. 07/2016 Echo: EF 55-60%; b. 02/2018 MV: EF 30%; c. 04/2018 Echo: EF 35%; d. 08/2018 Echo: EF 30-35%. Glob HK w/o rwma. Mildly reduced RV fxn. Mod to sev dil LA. Nl AoV fxn.  Marland Kitchen Hx of colonoscopy    2009 by Dr. Laural Golden. Normal except for small submucosal lipoma. No evidence of polyps or colitis.  . Hyperlipidemia   . Hypertension   .  Hypogonadism in male 07/25/2015  . Ischemic cardiomyopathy    a. 07/2016 Echo: EF 55-60%; b. 02/2018 MV: EF 30%; c. 04/2018 Echo: EF 35%; d. 08/2018 Echo: EF 30-35%.  . OSA (obstructive sleep apnea)    No CPAP  . Permanent atrial fibrillation (HCC)    a. CHA2DS2VASc = 3-->Coumadin (also mech AVR); b. 06/2018 Amio started 2/2 elevated rates.  . Renal cell cancer ([place])   . Renal cell carcinoma ([place]) 08/04/2016    Past Surgical History:  Procedure Laterality Date  . AORTIC VALVE REPLACEMENT  3 /11 /2014   At [place]    . CAPD INSERTION N/A 02/19/2017   Procedure: LAPAROSCOPIC INSERTION CONTINUOUS AMBULATORY PERITONEAL DIALYSIS  (CAPD) CATHETER;  Surgeon: Algernon Huxley, MD;  Location: [facility] ORS;  Service: General;  Laterality: N/A;  . CORONARY ANGIOPLASTY WITH STENT PLACEMENT    . CORONARY ARTERY BYPASS GRAFT N/A 09/16/2018   Procedure: CORONARY ARTERY BYPASS GRAFT times three      with left internal mammary artery and endoharvest of right leg greater saphenous vein;  Surgeon: Melrose Nakayama, MD;  Location: [place];  Service: Open Heart Surgery;  Laterality: N/A;  . DG AV DIALYSIS  SHUNT ACCESS EXIST*L* OR     left arm  . EPIGASTRIC HERNIA REPAIR N/A 02/19/2017   Procedure: HERNIA REPAIR EPIGASTRIC ADULT;  Surgeon: Robert Bellow, MD;  Location: [facility] ORS;  Service: General;  Laterality: N/A;  . EYE SURGERY     bilateral cataract  . HERNIA REPAIR     UMBILICAL HERNIA  . HERNIA REPAIR  02/19/2017  . INNER EAR SURGERY     20 years ago  . KIDNEY TRANSPLANT    . Peritoneal Dialysis access placement  02/19/2017  . REMOVAL OF A DIALYSIS CATHETER N/A 11/23/2017   Procedure: REMOVAL OF A PERITONEAL DIALYSIS CATHETER;  Surgeon: Algernon Huxley, MD;  Location: [facility] ORS;  Service: Vascular;  Laterality: N/A;  . RIGHT/LEFT HEART CATH AND CORONARY ANGIOGRAPHY Bilateral 06/07/2018   Procedure: RIGHT/LEFT HEART CATH AND CORONARY ANGIOGRAPHY;  Surgeon: Wellington Hampshire, MD;  Location: [place] CV LAB;  Service: Cardiovascular;  Laterality: Bilateral;  . TEE WITHOUT CARDIOVERSION N/A 07/21/2016   Procedure: TRANSESOPHAGEAL ECHOCARDIOGRAM (TEE);  Surgeon: Wellington Hampshire, MD;  Location: [facility] ORS;  Service: Cardiovascular;  Laterality: N/A;  . TEE WITHOUT CARDIOVERSION N/A 09/16/2018   Procedure: TRANSESOPHAGEAL ECHOCARDIOGRAM (TEE);  Surgeon: Melrose Nakayama, MD;  Location: [place];  Service: Open Heart Surgery;  Laterality: N/A;    Family History  Problem Relation Age of Onset  . Cancer Mother        pancreatic  . Heart disease Father   . Diabetes Father     SOCIAL HX: Non-smoker   Current Outpatient Medications:  .  acetaminophen (TYLENOL) 500 MG tablet, Take 1,000 mg 2 (two) times daily as needed by mouth for moderate pain.,  Disp: , Rfl:  .  albuterol (VENTOLIN HFA) 108 (90 Base) MCG/ACT inhaler, Inhale 2 puffs into the lungs every 4 (four) hours as needed for wheezing or shortness of breath., Disp: 6.7 g, Rfl: 0 .  allopurinol (ZYLOPRIM) 300 MG tablet, Take 300 mg by mouth daily., Disp: , Rfl: 5 .  aspirin EC 81 MG EC tablet, Take 1 tablet (81 mg total) by mouth daily., Disp:  , Rfl:  .  carvedilol (COREG) 3.125 MG tablet, Take 1 tablet (3.125 mg total) by mouth 2 (two) times daily with a meal., Disp: 60 tablet, Rfl: 3 .  cinacalcet (SENSIPAR) 30 MG tablet, Take 30 mg by mouth every other day. , Disp: , Rfl:  .  dextromethorphan (DELSYM) 30 MG/5ML liquid, Take 5 mLs (30 mg total) by mouth 2 (two) times daily., Disp: 89 mL, Rfl: 0 .  ezetimibe (ZETIA) 10 MG tablet, Take 1 tablet (10 mg total) by mouth daily., Disp: 90 tablet, Rfl: 3 .  folic acid-vitamin b complex-vitamin c-selenium-zinc (DIALYVITE) 3 MG TABS tablet, Take 1 tablet by mouth daily., Disp: , Rfl:  .  guaiFENesin (MUCINEX) 600 MG 12 hr tablet, Take 1 tablet (600 mg total) by mouth 2 (two) times daily as needed for cough or to loosen phlegm., Disp: 30 tablet, Rfl: 0 .   ipratropium (ATROVENT HFA) 17 MCG/ACT inhaler, Inhale 2 puffs into the lungs every 4 (four) hours., Disp: 1 Inhaler, Rfl: 12 .  lidocaine-prilocaine (EMLA) cream, Apply 1 application topically as needed (for fistula)., Disp: , Rfl:  .  multivitamin (RENA-VIT) TABS tablet, Take 1 tablet by mouth daily., Disp: , Rfl:  .  Nutritional Supplements (FEEDING SUPPLEMENT, NEPRO CARB STEADY,) LIQD, Take 237 mLs by mouth 3 (three) times daily between meals., Disp: 237 mL, Rfl: 3 .  omeprazole (PRILOSEC) 20 MG capsule, Take 20 mg by mouth daily., Disp: , Rfl:  .  predniSONE (DELTASONE) 20 MG tablet, Take 2 tablets (40 mg total) by mouth daily., Disp: 10 tablet, Rfl: 0 .  predniSONE (DELTASONE) 5 MG tablet, Take 5 mg by mouth daily with breakfast. , Disp: , Rfl:  .  sevelamer carbonate (RENVELA) 800 MG tablet, Take 40,000 mg by mouth 3 (three) times daily with meals. 5 tabs per meal, Disp: , Rfl:  .  vitamin C (VITAMIN C) 500 MG tablet, Take 1 tablet (500 mg total) by mouth daily., Disp: 30 tablet, Rfl: 0 .  warfarin (COUMADIN) 1 MG tablet, Take 1-2 tablets (1-2 mg total) by mouth See admin instructions. 1 mg all days , except 0.5 MG on Fridays..* OR AS DIRECTED, Disp: 180 tablet, Rfl: 1 .  zinc sulfate 220 (50 Zn) MG capsule, Take 1 capsule (220 mg total) by mouth daily., Disp: 30 capsule, Rfl: 0 .  pravastatin (PRAVACHOL) 20 MG tablet, Take 1 tablet (20 mg total) by mouth every evening., Disp: 90 tablet, Rfl: 3  EXAM:  VITALS per patient if applicable:  GENERAL: alert, oriented, appears well and in no acute distress  HEENT: atraumatic, conjunttiva clear, no obvious abnormalities on inspection of external nose and ears  NECK: normal movements of the head and neck  LUNGS: on inspection no signs of respiratory distress, breathing rate appears normal, no obvious gross SOB, gasping or wheezing  CV: no obvious cyanosis  MS: moves all visible extremities without noticeable abnormality  PSYCH/NEURO:  pleasant and cooperative, no obvious depression or anxiety, speech and thought processing grossly intact  ASSESSMENT AND PLAN:  Discussed the following assessment and  plan:  Atrial fibrillation (HCC) Asymptomatic.  Suspect his lightheadedness is orthostasis.  He will monitor that on the carvedilol.  Heart failure ([place]) Continue to follow with cardiology.  ESRD on dialysis ([place]) Continues on HD.  He is going to proceed with evaluation for another kidney transplant.  Foot ulcer ([place]) He will continue to try to get the Santyl ointment.  Advised that he needs to monitor for signs of infection and if they occur he needs to seek medical attention immediately.   No orders of the defined types were placed in this encounter.   No orders of the defined types were placed in this encounter.    I discussed the assessment and treatment plan with the patient. The patient was provided an opportunity to ask questions and all were answered. The patient agreed with the plan and demonstrated an understanding of the instructions.   The patient was advised to call back or seek an in-person evaluation if the symptoms worsen or if the condition fails to improve as anticipated.   Tommi Rumps, MD

## 2019-05-25 NOTE — Patient Instructions (Signed)
Left message on pt's vm & advised him to continue warfarin dosage of 1 tablet every day. Recheck INR on Monday, 05/30/19. Asked him to call back w/ any questions or concerns.

## 2019-05-26 ENCOUNTER — Ambulatory Visit (INDEPENDENT_AMBULATORY_CARE_PROVIDER_SITE_OTHER): Payer: 59 | Admitting: Nurse Practitioner

## 2019-05-26 ENCOUNTER — Encounter (INDEPENDENT_AMBULATORY_CARE_PROVIDER_SITE_OTHER): Payer: Medicare Other

## 2019-05-27 ENCOUNTER — Encounter: Payer: Self-pay | Admitting: Family Medicine

## 2019-05-27 DIAGNOSIS — L97509 Non-pressure chronic ulcer of other part of unspecified foot with unspecified severity: Secondary | ICD-10-CM | POA: Insufficient documentation

## 2019-05-27 NOTE — Assessment & Plan Note (Signed)
Continues on HD.  He is going to proceed with evaluation for another kidney transplant.

## 2019-05-27 NOTE — Assessment & Plan Note (Signed)
Asymptomatic.  Suspect his lightheadedness is orthostasis.  He will monitor that on the carvedilol.

## 2019-05-27 NOTE — Assessment & Plan Note (Signed)
Continue to follow with cardiology.

## 2019-05-27 NOTE — Assessment & Plan Note (Signed)
He will continue to try to get the Santyl ointment.  Advised that he needs to monitor for signs of infection and if they occur he needs to seek medical attention immediately.

## 2019-05-30 ENCOUNTER — Ambulatory Visit (INDEPENDENT_AMBULATORY_CARE_PROVIDER_SITE_OTHER): Payer: Medicare Other

## 2019-05-30 DIAGNOSIS — Z5181 Encounter for therapeutic drug level monitoring: Secondary | ICD-10-CM

## 2019-05-30 DIAGNOSIS — Z952 Presence of prosthetic heart valve: Secondary | ICD-10-CM

## 2019-05-30 DIAGNOSIS — I4891 Unspecified atrial fibrillation: Secondary | ICD-10-CM | POA: Diagnosis not present

## 2019-05-30 LAB — LAB REPORT - SCANNED: INR: 2.7

## 2019-05-30 LAB — POCT INR: INR: 2.7 (ref 2.0–3.0)

## 2019-05-30 NOTE — Patient Instructions (Signed)
Spoke w/ pt's Sharlyn Bologna and advised her to have pt continue warfarin dosage of 1 tablet every day. Recheck INR in 1 week.

## 2019-06-06 ENCOUNTER — Ambulatory Visit (INDEPENDENT_AMBULATORY_CARE_PROVIDER_SITE_OTHER): Payer: Medicare Other | Admitting: Family

## 2019-06-06 ENCOUNTER — Encounter: Payer: Self-pay | Admitting: Family

## 2019-06-06 ENCOUNTER — Other Ambulatory Visit: Payer: Self-pay

## 2019-06-06 ENCOUNTER — Ambulatory Visit (INDEPENDENT_AMBULATORY_CARE_PROVIDER_SITE_OTHER): Payer: Medicare Other

## 2019-06-06 VITALS — BP 130/72 | HR 102 | Ht 70.0 in | Wt 189.0 lb

## 2019-06-06 DIAGNOSIS — I4821 Permanent atrial fibrillation: Secondary | ICD-10-CM

## 2019-06-06 DIAGNOSIS — I2510 Atherosclerotic heart disease of native coronary artery without angina pectoris: Secondary | ICD-10-CM

## 2019-06-06 DIAGNOSIS — I5022 Chronic systolic (congestive) heart failure: Secondary | ICD-10-CM

## 2019-06-06 DIAGNOSIS — Z5181 Encounter for therapeutic drug level monitoring: Secondary | ICD-10-CM

## 2019-06-06 DIAGNOSIS — Z952 Presence of prosthetic heart valve: Secondary | ICD-10-CM | POA: Diagnosis not present

## 2019-06-06 DIAGNOSIS — I4891 Unspecified atrial fibrillation: Secondary | ICD-10-CM | POA: Diagnosis not present

## 2019-06-06 DIAGNOSIS — Z8616 Personal history of COVID-19: Secondary | ICD-10-CM

## 2019-06-06 DIAGNOSIS — E785 Hyperlipidemia, unspecified: Secondary | ICD-10-CM

## 2019-06-06 LAB — POCT INR: INR: 2.5 (ref 2.0–3.0)

## 2019-06-06 NOTE — Patient Instructions (Addendum)
Medication Instructions:  No medication changes today.   *If you need a refill on your cardiac medications before your next appointment, please call your pharmacy*   Lab Work: Your physician recommends that you return for lab work in: the next 1 - 2 weeks.  Please go to the [facility] for a fasting lipid panel and CMET. This will let us know how you are responding to the Zetia.   If you have labs (blood work) drawn today and your tests are completely normal, you will receive your results only by: Marland Kitchen MyChart Message (if you have MyChart) OR . A paper copy in the mail If you have any lab test that is abnormal or we need to change your treatment, we will call you to review the results.  Testing/Procedures: Your EKG today showed atrial fibrillation which is a stable finding.   Follow-Up: At [facility], you and your health needs are our priority.  As part of our continuing mission to provide you with exceptional heart care, we have created designated Provider Care Teams.  These Care Teams include your primary Cardiologist (physician) and Advanced Practice Providers (APPs -  Physician Assistants and Nurse Practitioners) who all work together to provide you with the care you need, when you need it.  We recommend signing up for the patient portal called "MyChart".  Sign up information is provided on this After Visit Summary.  MyChart is used to connect with patients for Virtual Visits (Telemedicine).  Patients are able to view lab/test results, encounter notes, upcoming appointments, etc.  Non-urgent messages can be sent to your provider as well.   To learn more about what you can do with MyChart, go to [email or web address].    Your next appointment:   2-3 month(s)  The format for your next appointment:   In Person  Provider:   Kathlyn [place], MD preferred  Other Instructions   Your echocardiogram showed a slight reduction in your heart pumping function compared to October. The  Carvedilol will help protect your heart pumping function. If you notice more significant drops in your blood pressure, let us know.

## 2019-06-06 NOTE — Patient Instructions (Signed)
Spoke w/ pt and advised him to continue warfarin dosage of 1 tablet every day. Recheck INR in 1 week.

## 2019-06-06 NOTE — Progress Notes (Signed)
Office Visit    Patient Name: Jeremy [name] Date of Encounter: 06/06/2019  Primary Care Provider:  Leone Haven, MD Primary Cardiologist:  Kathlyn Sacramento, MD Electrophysiologist:  None   Chief Complaint    Jeremy [name] is a 64 y.o. 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 Abnormal stress test 03/19/2018  . 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 Aortic stenosis    a. 2014 s/p mech AVR ([facility])-->chronic coumadin; b. 08/2018 Echo: Mild AS/AI. Nl fxn/gradient.  . Arthritis   . Atrial fibrillation ([place])   . CAD (coronary artery disease)    a. 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 02/2018 MV [facility]): large inf, infsept, inflat infarct w/ min rev, EF 30%; c. 06/2018 Cath: LM min irregs, LAD 60p, 95/30/47m, LCX min irregs, RCA 40p, 85m, 90d, RPAV 90, RPDA 95ost; c. 09/2018 CABGx3 (LIMA->LAD, VG->D1, VG->PDA).  . Chronic anticoagulation 06/2012   a. Coumadin in the setting of mech AVR and afib.  Marland Kitchen COVID-19 03/15/2019  . CPAP (continuous positive airway pressure) dependence   . Dialysis patient [facility]) 04/07/2006   Patient started HD around 1996,  had 1st transplant LLQ around 1998 until 2007 when they found renal cell cancer in transplant and both native kidneys, all were removed > went back on HD until 2nd transplant RLQ in 2014 which lasted 3 yrs; it was embolized for suspected acute rejection. Is currently on HD MWF in [place] w/ [facility] nephrology.  . ESRD (end stage renal disease) ([place])    a. CKD 2/2 to IgA nephropathy (dx age 89); b.  1999 s/p Kidney transplant; c. 2014 Bilat nephrectomy (transplanted kidney resected) in the setting of renal cell carcinoma; d. PD until 11/2017-->HD.  Marland Kitchen Heart murmur   . HFrEF (heart failure with reduced ejection fraction) ([place])    a. 07/2016 Echo: EF 55-60%; b. 02/2018 MV: EF 30%; c. 04/2018 Echo: EF 35%; d. 08/2018 Echo: EF 30-35%. Glob HK w/o rwma. Mildly reduced RV fxn. Mod to sev dil LA. Nl AoV fxn.  Marland Kitchen Hx of colonoscopy    2009 by Dr. Laural Golden. Normal except for small submucosal lipoma. No evidence of polyps or colitis.  . Hyperlipidemia   . Hypertension   . Hypogonadism in male 07/25/2015  . Ischemic cardiomyopathy    a. 07/2016 Echo: EF 55-60%; b. 02/2018 MV: EF 30%; c. 04/2018 Echo: EF 35%; d. 08/2018 Echo: EF 30-35%.  . OSA (obstructive sleep apnea)    No CPAP  . Permanent atrial fibrillation ([facility])    a. CHA2DS2VASc = 3-->Coumadin (also mech AVR); b. 06/2018 Amio started 2/2 elevated rates.  . Renal cell cancer ([place])   . Renal cell carcinoma ([place]) 08/04/2016   Past Surgical History:  Procedure Laterality Date  . AORTIC VALVE REPLACEMENT  3 /11 /2014   At [facility]    . CAPD INSERTION N/A 02/19/2017   Procedure: LAPAROSCOPIC INSERTION CONTINUOUS AMBULATORY PERITONEAL DIALYSIS  (CAPD) CATHETER;  Surgeon: Algernon Huxley,  MD;  Location: [facility];  Service: General;  Laterality: N/A;  . CORONARY ANGIOPLASTY WITH STENT PLACEMENT    . CORONARY ARTERY BYPASS GRAFT N/A 09/16/2018   Procedure: CORONARY ARTERY BYPASS GRAFT times three      with left internal mammary artery and endoharvest of right leg greater saphenous vein;  Surgeon: Melrose Nakayama, MD;  Location: [place];  Service: Open Heart Surgery;  Laterality: N/A;  . DG AV DIALYSIS  SHUNT ACCESS EXIST*L* OR     left arm  . EPIGASTRIC HERNIA REPAIR N/A 02/19/2017   Procedure: HERNIA REPAIR EPIGASTRIC ADULT;  Surgeon: Robert Bellow, MD;  Location: [facility];  Service: General;  Laterality:  N/A;  . EYE SURGERY     bilateral cataract  . HERNIA REPAIR     UMBILICAL HERNIA  . HERNIA REPAIR  02/19/2017  . INNER EAR SURGERY     20 years ago  . KIDNEY TRANSPLANT    . Peritoneal Dialysis access placement  02/19/2017  . REMOVAL OF A DIALYSIS CATHETER N/A 11/23/2017   Procedure: REMOVAL OF A PERITONEAL DIALYSIS CATHETER;  Surgeon: Algernon Huxley, MD;  Location: [facility];  Service: Vascular;  Laterality: N/A;  . RIGHT/LEFT HEART CATH AND CORONARY ANGIOGRAPHY Bilateral 06/07/2018   Procedure: RIGHT/LEFT HEART CATH AND CORONARY ANGIOGRAPHY;  Surgeon: Wellington Hampshire, MD;  Location: [place] [facility];  Service: Cardiovascular;  Laterality: Bilateral;  . TEE WITHOUT CARDIOVERSION N/A 07/21/2016   Procedure: TRANSESOPHAGEAL ECHOCARDIOGRAM (TEE);  Surgeon: Wellington Hampshire, MD;  Location: [facility];  Service: Cardiovascular;  Laterality: N/A;  . TEE WITHOUT CARDIOVERSION N/A 09/16/2018   Procedure: TRANSESOPHAGEAL ECHOCARDIOGRAM (TEE);  Surgeon: Melrose Nakayama, MD;  Location: [place];  Service: Open Heart Surgery;  Laterality: N/A;    Allergies  Allergies  Allergen Reactions  . Statins     Other reaction(s): Arthralgias (intolerance), Myalgias (intolerance)  Pt taking pravastatin   . Sertraline Rash    History of Present Illness    Jeremy [name] is a 65 y.o. 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  He was last seen 04/26/2019 by Christell Faith, PA.  In 2013 he underwent PCI/DES to the LAD with cath showing nonobstructive RCA and LCx disease.  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  Previously on peritoneal dialysis though switched to HD 11/2017 secondary to ineffective fluid removal.  Late 2019 he reported  significant worsening angina with Lexiscan Myoview 02/2018 at [facility] being high risk with large defect in the inferior, inferoseptal, and inferolateral myocardium which was fixed and minimally reversible.  EF 30% (prior EF normal).  Placed on Imdur with improvement in symptoms.  Echo 04/2018 showed drop in LV systolic function with EF 35%, mildly reduced RV systolic function, moderate biatrial enlargement, Saint Jude mechanical aortic valve with mean gradient 21 mmHg is, mild MR, mild pulmonary hypertension.  Subsequently underwent right and left heart cath 06/2018 which showed severe heavily calcified two-vessel CAD involving the LAD and RCA which was not optimal for arthrectomy.  RHC showed mildly elevated filling pressures, moderate pulmonary hypertension, normal cardiac output.  Rate was difficult to control leading him to be placed on amiodarone.  Ultimately underwent three-vessel CABG in June 2020 by Dr.  Hendrickson with LIMA to LAD, SVG to D1, SVG to PDA.  Postoperatively he had hypotension leading to the initiation of midodrine.  Follow-up echo 01/2019 with slight improvement in LVEF to 45-50%, moderate LVH, mildly reduced RV systolic function with normal cavity size, severely dilated LA, 21 mm Saint Jude mechanical aortic valve prosthesis with a mean gradient within the normal range for prosthetic valve.  Admitted to [facility]: [facility] Rhonda W 03/2019 with Covid pneumonia treated with remdesivir and intravenous steroids followed by prednisone taper.  He did develop some atrial fibrillation with RVR with ventricular rates in the 110s-120s.   No longer on Crestor secondary to myalgias though tolerates Pravastatin. Zetia was added on 04/27/19.  Noted dyspnea and fatigue after COVID 19 infection. Echo ordered and on 05/24/19 showed LVEF reduced to 40-45%, mild LVH,. His Lopressor was switched to Carvedilol. His Amiodarone has been discontinued due to his age to prevent adverse effects.   Presently undergoing workup for kidney  transplant at [facility] in [place].  Saw his transplant team T hursday. Tells me they think he is likely high risk. He has had two transplants int he past. He does not anticipate undergoing another transplant.   Reports no chest pain, pressure, tightness. Reports that his dyspnea has resolved.   Tells me they raised his dry weight by 0.5kg at HD and his episodes of lightheadedness have improved. Monitors BP carefully and brings log. Average 100-110/60s with highest BPs on Sundays. He presumes this is as he does not have HD Saturday, Sunday and he notices volume build up prior to Monday HD.   EKGs/Labs/Other Studies Reviewed:   The following studies were reviewed today:   EKG:  EKG is ordered today.  The ekg ordered today demonstrates atrial fibrillation rate 102 bpm with RBBB  Recent Labs: 03/15/2019: B Natriuretic Peptide 1,821.0 03/17/2019: Hemoglobin 10.8; Magnesium 2.6; Platelets 121 04/14/2019: BUN 20; Creatinine 4.9; Potassium 4.3; Sodium 143 04/26/2019: ALT 14  Recent Lipid Panel    Component Value Date/Time   CHOL 179 04/26/2019 0938   TRIG 167 (H) 04/26/2019 0938   HDL 38 (L) 04/26/2019 0938   CHOLHDL 4.7 04/26/2019 0938   CHOLHDL 3 08/08/2016 0946   VLDL 31.6 08/08/2016 0946   LDLCALC 111 (H) 04/26/2019 0938   LDLDIRECT 61.0 08/11/2017 1447    Home Medications   Current Meds  Medication Sig  . acetaminophen (TYLENOL) 500 MG tablet Take 1,000 mg 2 (two) times daily as needed by mouth for moderate pain.  Marland Kitchen albuterol (VENTOLIN HFA) 108 (90 Base) MCG/ACT inhaler Inhale 2 puffs into the lungs every 4 (four) hours as needed for wheezing or shortness of breath.  . allopurinol (ZYLOPRIM) 300 MG tablet Take 300 mg by mouth daily.  Marland Kitchen aspirin EC 81 MG EC tablet Take 1 tablet (81 mg total) by mouth daily.  . carvedilol (COREG) 3.125 MG tablet Take 1 tablet (3.125 mg total) by mouth 2 (two) times daily with a meal.  . cinacalcet (SENSIPAR) 30 MG tablet Take 30 mg by mouth every  other day.   Marland Kitchen dextromethorphan (DELSYM) 30 MG/5ML liquid Take 5 mLs (30 mg total) by mouth 2 (two) times daily.  Marland Kitchen ezetimibe (ZETIA) 10 MG tablet Take 1 tablet (10 mg total) by mouth daily.  . folic acid-vitamin b complex-vitamin c-selenium-zinc (DIALYVITE) 3 MG TABS tablet Take 1 tablet by mouth daily.  Marland Kitchen guaiFENesin (MUCINEX) 600 MG 12 hr tablet Take 1 tablet (600 mg total) by mouth 2 (two) times daily as needed for cough  or to loosen phlegm.  Marland Kitchen ipratropium (ATROVENT HFA) 17 MCG/ACT inhaler Inhale 2 puffs into the lungs every 4 (four) hours.  . lidocaine-prilocaine (EMLA) cream Apply 1 application topically as needed (for fistula).  . multivitamin (RENA-VIT) TABS tablet Take 1 tablet by mouth daily.  . Nutritional Supplements (FEEDING SUPPLEMENT, NEPRO CARB STEADY,) LIQD Take 237 mLs by mouth 3 (three) times daily between meals.  Marland Kitchen omeprazole (PRILOSEC) 20 MG capsule Take 20 mg by mouth daily.  . pravastatin (PRAVACHOL) 20 MG tablet Take 1 tablet (20 mg total) by mouth every evening.  . predniSONE (DELTASONE) 20 MG tablet Take 2 tablets (40 mg total) by mouth daily.  . predniSONE (DELTASONE) 5 MG tablet Take 5 mg by mouth daily with breakfast.   . sevelamer carbonate (RENVELA) 800 MG tablet Take 40,000 mg by mouth 3 (three) times daily with meals. 5 tabs per meal  . vitamin C (VITAMIN C) 500 MG tablet Take 1 tablet (500 mg total) by mouth daily.  Marland Kitchen warfarin (COUMADIN) 1 MG tablet Take 1-2 tablets (1-2 mg total) by mouth See admin instructions. 1 mg all days , except 0.5 MG on Fridays..* OR AS DIRECTED  . zinc sulfate 220 (50 Zn) MG capsule Take 1 capsule (220 mg total) by mouth daily.    Review of Systems      Review of Systems  Constitution: Negative for chills, fever and malaise/fatigue.  Cardiovascular: Negative for chest pain, dyspnea on exertion, leg swelling, near-syncope, orthopnea, palpitations and syncope.  Respiratory: Negative for cough, shortness of breath and wheezing.     Gastrointestinal: Negative for nausea and vomiting.  Neurological: Negative for dizziness, light-headedness and weakness.   All other systems reviewed and are otherwise negative except as noted above.  Physical Exam    VS:  There were no vitals taken for this visit. , BMI There is no height or weight on file to calculate BMI. GEN: Well nourished, well developed, in no acute distress. HEENT: normal. Neck: Supple, no JVD, carotid bruits, or masses. Cardiac: irregularly irregular, no murmurs, rubs, or gallops. No clubbing, cyanosis, edema.  Radials/PT 2+ and equal bilaterally.  Respiratory:  Respirations regular and unlabored, clear to auscultation bilaterally. GI: Soft, nontender, nondistended, BS + x 4. MS: No deformity or atrophy. Skin: Warm and dry, no rash. L forearm fistula noted. Dressing in place from HD today which is clean, dry, intact. Neuro:  Strength and sensation are intact. Psych: Normal affect.  Accessory Clinical Findings    ECG personally reviewed by me today - atrial fibrillation rate 102 bpm with RBBB - no acute changes.  Assessment & Plan    1. CAD s/p CABG without angina - Stable with no anginal symptoms. Continue Coumadin and aspirin. Continue Coreg and statin. No indication for repeat ischemic evaluation at this time as previous dyspnea has resolved.  2. HFrEF secondary to likely ICM - Echo 05/24/19 with LVEF 40-45%, mild decrease from previous.  Euvolemic and well compensated. Volume management per HD. Continue Coreg. No ACE/ARB/spironolactone secondary to relative hypotension and ESRD. Continue low sodium diet and fluid restrictions.   3. PAD - Follows with vascular surgery. Upcoming duplex.   4. Permanent atrial fibrillation - Rate 103 bpm today. Amiodarone has been previously discontinued due to hypotension and age. Does note some elevated HR into the 120s with HD likely due to quick volume changes. Unfortunately, unable to increase Coreg dose due to  hypotension. If tachycardia persists, may require transition back to Metoprolol. As he has  only been on Coreg 2 weeks and it is of greater benefit to his HFrEF, we will continue to monitor. Continue Coumadin for anticoagulation, denies bleeding complications.   5. Fatigue/dyspnea - Noted at previous office visit in setting of recent COVID19 infection. Symptoms have since resolved.  6. AS s/p mechanical AVR - Stable on recent echo. Continue Coumadin.   7. HLD - Intolerant of high intensity statin. Tolerating Pravastatin. Zetia added 1/20201. [facility] for fasting lipid panel. If LDL>70 likely referral to lipid clinic for [id number].  Disposition: Follow up in 2 month(s) with Dr. Tor Netters, NP 06/06/2019, 3:09 PM

## 2019-06-07 ENCOUNTER — Encounter (INDEPENDENT_AMBULATORY_CARE_PROVIDER_SITE_OTHER): Payer: Self-pay | Admitting: Nurse Practitioner

## 2019-06-07 ENCOUNTER — Ambulatory Visit (INDEPENDENT_AMBULATORY_CARE_PROVIDER_SITE_OTHER): Payer: Medicare Other | Admitting: Nurse Practitioner

## 2019-06-07 ENCOUNTER — Ambulatory Visit (INDEPENDENT_AMBULATORY_CARE_PROVIDER_SITE_OTHER): Payer: Medicare Other

## 2019-06-07 VITALS — BP 155/78 | HR 109 | Resp 10 | Ht 70.0 in | Wt 191.0 lb

## 2019-06-07 DIAGNOSIS — L98499 Non-pressure chronic ulcer of skin of other sites with unspecified severity: Secondary | ICD-10-CM

## 2019-06-07 DIAGNOSIS — I7025 Atherosclerosis of native arteries of other extremities with ulceration: Secondary | ICD-10-CM | POA: Diagnosis not present

## 2019-06-07 DIAGNOSIS — I10 Essential (primary) hypertension: Secondary | ICD-10-CM

## 2019-06-07 DIAGNOSIS — L97509 Non-pressure chronic ulcer of other part of unspecified foot with unspecified severity: Secondary | ICD-10-CM

## 2019-06-07 MED ORDER — SILVER SULFADIAZINE 1 % EX CREA: 1.0000 "application " | TOPICAL_CREAM | Freq: Every day | CUTANEOUS | 0 refills | Status: DC

## 2019-06-07 NOTE — Progress Notes (Signed)
SUBJECTIVE:  Patient ID: Jeremy [name], male    DOB: Jun 09, 1955, 64 y.o.   MRN: [id number] Chief Complaint  Patient presents with  . Follow-up    U/S Follow up    HPI  Jeremy [name] is a 64 y.o. male that follows up for noninvasive studies as well as evaluation of his lower extremity ulceration.  The patient was originally sent by his podiatrist due to an ulceration on his right lower extremity that has been persistent for months.  The patient recently started applying Santyl and the scabbing over the front at has gone but there is still wound present.  The patient also has some small wounds on his left great toe and now has 2 open areas that are not quite ulcerations.  The patient does have some claudication-like symptoms however he denies rest pain or ischemic-like symptoms.  Currently no issues with dialysis.  Denies any fever, chills, nausea, vomiting or diarrhea.  Today the patient has no evidence of an abdominal aortic aneurysm velocities within the iliac arteries are not suggestive of significant stenosis however previous CT scan done in 2019 at [facility] does indicate that the patient has extensive calcified atherosclerotic disease at the abdominal aorta as well as the external iliac arteries bilaterally.  There is also the concern noted on the CT scan that this calcification may have caused failure of his renal transplants.  Past Medical History:  Diagnosis Date  . Abnormal stress test 03/19/2018  . 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 Aortic stenosis    a. 2014 s/p mech AVR ([facility])-->chronic coumadin; b. 08/2018 Echo: Mild AS/AI. Nl fxn/gradient.  . Arthritis   . Atrial fibrillation ([place])   . CAD (coronary artery disease)    a. 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 02/2018 MV [facility]): large inf, infsept, inflat infarct w/ min rev, EF 30%; c. 06/2018 Cath: LM min irregs, LAD 60p, 95/30/62m, LCX min irregs, RCA 40p,  76m, 90d, RPAV 90, RPDA 95ost; c. 09/2018 CABGx3 (LIMA->LAD, VG->D1, VG->PDA).  . Chronic anticoagulation 06/2012   a. Coumadin in the setting of mech AVR and afib.  Marland Kitchen COVID-19 03/15/2019  . CPAP (continuous positive airway pressure) dependence   . Dialysis patient [facility]) 04/07/2006   Patient started HD around 1996,  had 1st transplant LLQ around 1998 until 2007 when they found renal cell cancer in transplant and both native kidneys, all were removed > went back on HD until 2nd transplant RLQ in 2014 which lasted 3 yrs; it was embolized for suspected acute rejection. Is currently on HD MWF in [place] w/ [facility] nephrology.  . ESRD (end stage renal disease) ([place])    a. 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 1999 s/p Kidney transplant; c. 2014 Bilat nephrectomy (transplanted kidney resected) in the setting of renal cell carcinoma; d. PD until 11/2017-->HD.  Marland Kitchen Heart murmur   . HFrEF (heart failure with reduced ejection fraction) ([place])    a. 07/2016 Echo: EF 55-60%; b. 02/2018 MV: EF 30%; c. 04/2018 Echo: EF 35%; d. 08/2018 Echo: EF 30-35%. Glob HK w/o rwma. Mildly reduced RV fxn. Mod to sev dil LA. Nl AoV fxn.  Marland Kitchen Hx of colonoscopy    2009 by Dr. Laural Golden. Normal except for small submucosal lipoma. No evidence of polyps or colitis.  . Hyperlipidemia   . Hypertension   . Hypogonadism in male 07/25/2015  .  Ischemic cardiomyopathy    a. 07/2016 Echo: EF 55-60%; b. 02/2018 MV: EF 30%; c. 04/2018 Echo: EF 35%; d. 08/2018 Echo: EF 30-35%.  . OSA (obstructive sleep apnea)    No CPAP  . Permanent atrial fibrillation (H[facility])    a. CHA2DS2VASc = 3-->Coumadin (also mech AVR); b. 06/2018 Amio started 2/2 elevated rates.  . Renal cell cancer ([place])   . Renal cell carcinoma ([place]) 08/04/2016    Past Surgical History:  Procedure Laterality Date  . AORTIC VALVE REPLACEMENT  3 /11 /2014   At [facility]    . CAPD INSERTION N/A 02/19/2017   Procedure: LAPAROSCOPIC INSERTION  CONTINUOUS AMBULATORY PERITONEAL DIALYSIS  (CAPD) CATHETER;  Surgeon: Algernon Huxley, MD;  Location: [facility] ORS;  Service: General;  Laterality: N/A;  . CORONARY ANGIOPLASTY WITH STENT PLACEMENT    . CORONARY ARTERY BYPASS GRAFT N/A 09/16/2018   Procedure: CORONARY ARTERY BYPASS GRAFT times three      with left internal mammary artery and endoharvest of right leg greater saphenous vein;  Surgeon: Melrose Nakayama, MD;  Location: [place];  Service: Open Heart Surgery;  Laterality: N/A;  . DG AV DIALYSIS  SHUNT ACCESS EXIST*L* OR     left arm  . EPIGASTRIC HERNIA REPAIR N/A 02/19/2017   Procedure: HERNIA REPAIR EPIGASTRIC ADULT;  Surgeon: Robert Bellow, MD;  Location: [facility] ORS;  Service: General;  Laterality: N/A;  . EYE SURGERY     bilateral cataract  . HERNIA REPAIR     UMBILICAL HERNIA  . HERNIA REPAIR  02/19/2017  . INNER EAR SURGERY     20 years ago  . KIDNEY TRANSPLANT    . Peritoneal Dialysis access placement  02/19/2017  . REMOVAL OF A DIALYSIS CATHETER N/A 11/23/2017   Procedure: REMOVAL OF A PERITONEAL DIALYSIS CATHETER;  Surgeon: Algernon Huxley, MD;  Location: [facility] ORS;  Service: Vascular;  Laterality: N/A;  . RIGHT/LEFT HEART CATH AND CORONARY ANGIOGRAPHY Bilateral 06/07/2018   Procedure: RIGHT/LEFT HEART CATH AND CORONARY ANGIOGRAPHY;  Surgeon: Wellington Hampshire, MD;  Location: [place] CV [facility];  Service: Cardiovascular;  Laterality: Bilateral;  . TEE WITHOUT CARDIOVERSION N/A 07/21/2016   Procedure: TRANSESOPHAGEAL ECHOCARDIOGRAM (TEE);  Surgeon: Wellington Hampshire, MD;  Location: [facility];  Service: Cardiovascular;  Laterality: N/A;  . TEE WITHOUT CARDIOVERSION N/A 09/16/2018   Procedure: TRANSESOPHAGEAL ECHOCARDIOGRAM (TEE);  Surgeon: Melrose Nakayama, MD;  Location: [place];  Service: Open Heart Surgery;  Laterality: N/A;    Social History   Socioeconomic History  . Marital status: Significant Other    Spouse name: Not on file  . Number of children: Not on file  .  Years of education: Not on file  . Highest education level: High school graduate  Occupational History  . 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 Smoking status: Never Smoker  . Smokeless tobacco: Never Used  Substance and Sexual Activity  . Alcohol use: No  . Drug use: No  . Sexual activity: Not Currently    Partners: Female  Other Topics Concern  . Not on file  Social History Narrative   Single   1 daughter 19    Social Determinants of Health   Financial Resource Strain:   . Difficulty of Paying Living Expenses: Not on file  Food Insecurity:   . Worried About Charity fundraiser in the Last Year: Not on file  . Ran  Out of Food in the Last Year: Not on file  Transportation Needs:   . Lack of Transportation (Medical): Not on file  . Lack of Transportation (Non-Medical): Not on file  Physical Activity:   . Days of Exercise per Week: Not on file  . Minutes of Exercise per Session: Not on file  Stress:   . Feeling of Stress : Not on file  Social Connections: Unknown  . Frequency of Communication with Friends and Family: More than three times a week  . Frequency of Social Gatherings with Friends and Family: Once a week  . Attends Religious Services: 1 to 4 times per year  . Active Member of Clubs or Organizations: Not on file  . Attends Archivist Meetings: Not on file  . Marital Status: Living with partner  Intimate Partner Violence:   . Fear of Current or Ex-Partner: Not on file  . Emotionally Abused: Not on file  . Physically Abused: Not on file  . Sexually Abused: Not on file    Family History  Problem Relation Age of Onset  . Cancer Mother        pancreatic  . Heart disease Father   . Diabetes Father     Allergies  Allergen Reactions  . Statins     Other reaction(s): Arthralgias (intolerance), Myalgias (intolerance)  Pt taking pravastatin   . Sertraline Rash     Review of Systems   Review of Systems: Negative  Unless Checked Constitutional: [] Weight loss  [] Fever  [] Chills Cardiac: [] Chest pain   []  Atrial Fibrillation  [] Palpitations   [] Shortness of breath when laying flat   [] Shortness of breath with exertion. [] Shortness of breath at rest Vascular:  [] Pain in legs with walking   [] Pain in legs with standing [] Pain in legs when laying flat   [] Claudication    [] Pain in feet when laying flat    [] History of DVT   [] Phlebitis   [] Swelling in legs   [] Varicose veins   [x] Non-healing ulcers Pulmonary:   [] Uses home oxygen   [] Productive cough   [] Hemoptysis   [] Wheeze  [] COPD   [] Asthma Neurologic:  [] Dizziness   [] Seizures  [] Blackouts [] History of stroke   [] History of TIA  [] Aphasia   [] Temporary Blindness   [] Weakness or numbness in arm   [] Weakness or numbness in leg Musculoskeletal:   [] Joint swelling   [] Joint pain   [] Low back pain  []  History of Knee Replacement [x] Arthritis [] back Surgeries  []  Spinal Stenosis    Hematologic:  [] Easy bruising  [] Easy bleeding   [] Hypercoagulable state   [x] Anemic Gastrointestinal:  [] Diarrhea   [] Vomiting  [] Gastroesophageal reflux/heartburn   [] Difficulty swallowing. [] Abdominal pain Genitourinary:  [x] Chronic kidney disease   [] Difficult urination  [] Anuric   [] Blood in urine [] Frequent urination  [] Burning with urination   [] Hematuria Skin:  [] Rashes   [x] Ulcers [] Wounds Psychological:  [] History of anxiety   [x]  History of major depression  []  Memory Difficulties      OBJECTIVE:   Physical Exam  BP (!) 155/78   Pulse (!) 109   Resp 10   Ht 5\' 10"  (1.778 m)   Wt 191 lb (86.6 kg)   BMI 27.41 kg/m   Gen: WD/WN, NAD Head: [place]/AT, No temporalis wasting.  Ear/Nose/Throat: Hearing grossly intact, nares w/o erythema or drainage Eyes: PER, EOMI, sclera nonicteric.  Neck: Supple, no masses.  No JVD.  Pulmonary:  Good air movement, no use of accessory muscles.  Cardiac: RRR Vascular:  Ulceration on side of right foot, scabbing gone.  Bilateral feet warm.   Small tiny open areas on left great toe Vessel Right Left  Radial Palpable Palpable  Dorsalis Pedis Not Palpable Not Palpable  Posterior Tibial Not Palpable Not Palpable   Gastrointestinal: soft, non-distended. No guarding/no peritoneal signs.  Musculoskeletal: M/S 5/5 throughout.  No deformity or atrophy.  Neurologic: Pain and light touch intact in extremities.  Symmetrical.  Speech is fluent. Motor exam as listed above. Psychiatric: Judgment intact, Mood & affect appropriate for pt's clinical situation.        ASSESSMENT AND PLAN:  1. Ulcer of foot, unspecified laterality, unspecified ulcer stage [facility]) Patient will continue to apply Santyl ointment on the ulceration on the right lower extremity.  Gave patient prescription for sulfa Silvadene for the wound on the left.  This is due to the fact that Santyl can have some negative effects on good tissue and so utilizing it on a very shallow wound would not be in the best interest and could actually worsen his wound.  We will reevaluate wounds when patient follows up in 4 weeks. - silver sulfADIAZINE (SILVADENE) 1 % cream; Apply 1 application topically daily.  Dispense: 50 g; Refill: 0  2. Atherosclerosis of native arteries of the extremities with ulceration ([place]) While the duplex did not show any evidence of elevated velocities, the CT scan does show extensive calcification.  In some instances extensive calcification can cause velocities not shows elevated with duplex.  We will continue to allow the topical wound treatment to see if the wounds continue to heal.  If so we will not need to intervene however if after 4 weeks we follow-up the patient still has persistent wounds intervention would likely be the best option to prevent worsening of the wound or possible infection.  Patient is also advised that if his wound suddenly changes or gets worse prior to his follow-up he should contact our office.  3. Essential hypertension Good blood pressure  control today.  Patient on appropriate medications.  No changes needed.   Current Outpatient Medications on File Prior to Visit  Medication Sig Dispense Refill  . acetaminophen (TYLENOL) 500 MG tablet Take 1,000 mg 2 (two) times daily as needed by mouth for moderate pain.    Marland Kitchen allopurinol (ZYLOPRIM) 300 MG tablet Take 300 mg by mouth daily.  5  . aspirin EC 81 MG EC tablet Take 1 tablet (81 mg total) by mouth daily.    . carvedilol (COREG) 3.125 MG tablet Take 1 tablet (3.125 mg total) by mouth 2 (two) times daily with a meal. 60 tablet 3  . cinacalcet (SENSIPAR) 30 MG tablet Take 30 mg by mouth every other day.     . ezetimibe (ZETIA) 10 MG tablet Take 1 tablet (10 mg total) by mouth daily. 90 tablet 3  . folic acid-vitamin b complex-vitamin c-selenium-zinc (DIALYVITE) 3 MG TABS tablet Take 1 tablet by mouth daily.    Marland Kitchen lidocaine-prilocaine (EMLA) cream Apply 1 application topically as needed (for fistula).    . multivitamin (RENA-VIT) TABS tablet Take 1 tablet by mouth daily.    Marland Kitchen omeprazole (PRILOSEC) 20 MG capsule Take 20 mg by mouth daily.    . pravastatin (PRAVACHOL) 20 MG tablet Take 1 tablet (20 mg total) by mouth every evening. 90 tablet 3  . predniSONE (DELTASONE) 5 MG tablet Take 5 mg by mouth daily with breakfast.     . sevelamer carbonate (RENVELA) 800 MG tablet Take 40,000 mg  by mouth 3 (three) times daily with meals. 5 tabs per meal    . vitamin C (VITAMIN C) 500 MG tablet Take 1 tablet (500 mg total) by mouth daily. 30 tablet 0  . warfarin (COUMADIN) 1 MG tablet Take 1-2 tablets (1-2 mg total) by mouth See admin instructions. 1 mg all days , except 0.5 MG on Fridays..* OR AS DIRECTED 180 tablet 1  . zinc sulfate 220 (50 Zn) MG capsule Take 1 capsule (220 mg total) by mouth daily. 30 capsule 0  . albuterol (VENTOLIN HFA) 108 (90 Base) MCG/ACT inhaler Inhale 2 puffs into the lungs every 4 (four) hours as needed for wheezing or shortness of breath. (Patient not taking: Reported on  06/07/2019) 6.7 g 0  . guaiFENesin (MUCINEX) 600 MG 12 hr tablet Take 1 tablet (600 mg total) by mouth 2 (two) times daily as needed for cough or to loosen phlegm. (Patient not taking: Reported on 06/07/2019) 30 tablet 0  . ipratropium (ATROVENT HFA) 17 MCG/ACT inhaler Inhale 2 puffs into the lungs every 4 (four) hours. (Patient not taking: Reported on 06/07/2019) 1 Inhaler 12  . Nutritional Supplements (FEEDING SUPPLEMENT, NEPRO CARB STEADY,) LIQD Take 237 mLs by mouth 3 (three) times daily between meals. (Patient not taking: Reported on 06/07/2019) 237 mL 3   No current facility-administered medications on file prior to visit.    There are no Patient Instructions on file for this visit. No follow-ups on file.   Kris Hartmann, NP  This note was completed with Sales executive.  Any errors are purely unintentional.

## 2019-06-12 ENCOUNTER — Encounter (INDEPENDENT_AMBULATORY_CARE_PROVIDER_SITE_OTHER): Payer: Self-pay

## 2019-06-13 NOTE — Telephone Encounter (Signed)
Patient's ultrasounds that were requested has been faxed per the patient to Thalia Party at the transplant team in [place].

## 2019-06-15 ENCOUNTER — Ambulatory Visit (INDEPENDENT_AMBULATORY_CARE_PROVIDER_SITE_OTHER): Payer: Medicare Other

## 2019-06-15 ENCOUNTER — Encounter: Payer: Self-pay | Admitting: Cardiovascular Disease

## 2019-06-15 DIAGNOSIS — I4821 Permanent atrial fibrillation: Secondary | ICD-10-CM

## 2019-06-15 DIAGNOSIS — Z5181 Encounter for therapeutic drug level monitoring: Secondary | ICD-10-CM

## 2019-06-15 DIAGNOSIS — Z952 Presence of prosthetic heart valve: Secondary | ICD-10-CM

## 2019-06-15 LAB — POCT INR: INR: 2.4 (ref 2.0–3.0)

## 2019-06-15 NOTE — Telephone Encounter (Signed)
This encounter was created in error - please disregard.

## 2019-06-15 NOTE — Patient Instructions (Signed)
Sent pt MyChart message and advised him to take extra 1/2 tablet today, then continue warfarin dosage of 1 tablet every day. Recheck INR in 1 week.

## 2019-06-20 MED ORDER — CARVEDILOL 6.25 MG PO TABS: 6.2500 mg | ORAL_TABLET | Freq: Two times a day (BID) | ORAL | 3 refills | Status: DC

## 2019-06-21 ENCOUNTER — Other Ambulatory Visit
Admission: RE | Admit: 2019-06-21 | Discharge: 2019-06-21 | Disposition: A | Discharge status: Home / Self Care | Payer: Medicare Other | Source: Ambulatory Visit | Attending: Physician Assistant | Admitting: Physician Assistant

## 2019-06-21 ENCOUNTER — Other Ambulatory Visit: Payer: Self-pay | Admitting: Cardiovascular Disease

## 2019-06-21 DIAGNOSIS — E785 Hyperlipidemia, unspecified: Secondary | ICD-10-CM | POA: Insufficient documentation

## 2019-06-21 LAB — HEPATIC FUNCTION PANEL
ALT: 19 U/L (ref 0–44)
AST: 23 U/L (ref 15–41)
Albumin: 3.9 g/dL (ref 3.5–5.0)
Alkaline Phosphatase: 152 U/L — ABNORMAL HIGH (ref 38–126)
Bilirubin, Direct: <0.1 mg/dL (ref 0.0–0.2)
Total Bilirubin: 0.7 mg/dL (ref 0.3–1.2)
Total Protein: 7.6 g/dL (ref 6.5–8.1)

## 2019-06-21 LAB — LIPID PANEL
Cholesterol: 136 mg/dL (ref 0–200)
HDL: 33 mg/dL — ABNORMAL LOW (ref >40)
LDL Cholesterol: 73 mg/dL (ref 0–99)
Total CHOL/HDL Ratio: 4.1 RATIO
Triglycerides: 150 mg/dL — ABNORMAL HIGH (ref <150)
VLDL: 30 mg/dL (ref 0–40)

## 2019-06-22 ENCOUNTER — Encounter: Payer: Self-pay | Admitting: Cardiovascular Disease

## 2019-06-22 ENCOUNTER — Ambulatory Visit (INDEPENDENT_AMBULATORY_CARE_PROVIDER_SITE_OTHER): Payer: Medicare Other

## 2019-06-22 ENCOUNTER — Ambulatory Visit: Payer: Medicare Other | Admitting: Family Medicine

## 2019-06-22 DIAGNOSIS — Z952 Presence of prosthetic heart valve: Secondary | ICD-10-CM | POA: Diagnosis not present

## 2019-06-22 DIAGNOSIS — I4821 Permanent atrial fibrillation: Secondary | ICD-10-CM | POA: Diagnosis not present

## 2019-06-22 LAB — POCT INR: INR: 3 (ref 2.0–3.0)

## 2019-06-22 LAB — LAB REPORT - SCANNED
INR: 3
INR: 3

## 2019-06-22 NOTE — Patient Instructions (Signed)
Sent pt MyChart message and advised him to continue warfarin dosage of 1 tablet every day. Recheck INR in 1 week.

## 2019-06-24 ENCOUNTER — Encounter: Payer: Self-pay | Admitting: Family

## 2019-06-24 ENCOUNTER — Ambulatory Visit (INDEPENDENT_AMBULATORY_CARE_PROVIDER_SITE_OTHER): Payer: Medicare Other | Admitting: Family

## 2019-06-24 ENCOUNTER — Other Ambulatory Visit: Payer: Self-pay

## 2019-06-24 VITALS — BP 110/60 | HR 98 | Ht 70.0 in | Wt 189.0 lb

## 2019-06-24 DIAGNOSIS — I2510 Atherosclerotic heart disease of native coronary artery without angina pectoris: Secondary | ICD-10-CM | POA: Diagnosis not present

## 2019-06-24 DIAGNOSIS — R079 Chest pain, unspecified: Secondary | ICD-10-CM

## 2019-06-24 DIAGNOSIS — Z952 Presence of prosthetic heart valve: Secondary | ICD-10-CM | POA: Diagnosis not present

## 2019-06-24 DIAGNOSIS — I4821 Permanent atrial fibrillation: Secondary | ICD-10-CM | POA: Diagnosis not present

## 2019-06-24 MED ORDER — NITROGLYCERIN 0.4 MG SL SUBL: 0.4000 mg | SUBLINGUAL_TABLET | SUBLINGUAL | 3 refills | Status: AC | PRN

## 2019-06-24 NOTE — Patient Instructions (Addendum)
Medication Instructions:   Your physician recommends that you continue on your current medications as directed. Please refer to the Current Medication list given to you today.  *If you need a refill on your cardiac medications before your next appointment, please call your pharmacy*   Lab Work: None If you have labs (blood work) drawn today and your tests are completely normal, you will receive your results only by: Marland Kitchen MyChart Message (if you have MyChart) OR . A paper copy in the mail If you have any lab test that is abnormal or we need to change your treatment, we will call you to review the results.   Testing/Procedures: [place]  Your caregiver has ordered a Stress Test with nuclear imaging. The purpose of this test is to evaluate the blood supply to your heart muscle. This procedure is referred to as a "Non-Invasive Stress Test." This is because other than having an IV started in your vein, nothing is inserted or "invades" your body. Cardiac stress tests are done to find areas of poor blood flow to the heart by determining the extent of coronary artery disease (CAD). Some patients exercise on a treadmill, which naturally increases the blood flow to your heart, while others who are  unable to walk on a treadmill due to physical limitations have a pharmacologic/chemical stress agent called Lexiscan . This medicine will mimic walking on a treadmill by temporarily increasing your coronary blood flow.   Please note: these test may take anywhere between 2-4 hours to complete  PLEASE REPORT TO [place] AT THE FIRST DESK WILL DIRECT YOU WHERE TO GO  Date of Procedure:_____________________________________  Arrival Time for Procedure:______________________________  Instructions regarding medication:   No meds to hold.  PLEASE NOTIFY THE OFFICE AT LEAST 75 HOURS IN ADVANCE IF YOU ARE UNABLE TO KEEP YOUR APPOINTMENT.  [phone] AND  PLEASE NOTIFY  NUCLEAR MEDICINE AT [facility] AT LEAST 24 HOURS IN ADVANCE IF YOU ARE UNABLE TO KEEP YOUR APPOINTMENT. [phone]  How to prepare for your Myoview test:  1. Do not eat or drink after midnight 2. No caffeine for 24 hours prior to test 3. No smoking 24 hours prior to test. 4. Your medication may be taken with water.  If your doctor stopped a medication because of this test, do not take that medication. 5. Ladies, please do not wear dresses.  Skirts or pants are appropriate. Please wear a short sleeve shirt. 6. No perfume, cologne or lotion. 7. Wear comfortable walking shoes. No heels!   Follow-Up:  At [facility], you and your health needs are our priority.  As part of our continuing mission to provide you with exceptional heart care, we have created designated Provider Care Teams.  These Care Teams include your primary Cardiologist (physician) and Advanced Practice Providers (APPs -  Physician Assistants and Nurse Practitioners) who all work together to provide you with the care you need, when you need it.  We recommend signing up for the patient portal called "MyChart".  Sign up information is provided on this After Visit Summary.  MyChart is used to connect with patients for Virtual Visits (Telemedicine).  Patients are able to view lab/test results, encounter notes, upcoming appointments, etc.  Non-urgent messages can be sent to your provider as well.   To learn more about what you can do with MyChart, go to [email or web address].    Your next appointment:   4 week(s)  The format for your next appointment:  In Person  Provider:   Lafayette Dragon   Other Instructions

## 2019-06-24 NOTE — Progress Notes (Signed)
Office Visit    Patient Name: Jeremy [name] Date of Encounter: 06/24/2019  Primary Care Provider:  Leone Haven, MD Primary Cardiologist:  Kathlyn Sacramento, MD Electrophysiologist:  None   Chief Complaint    Jeremy [name] is a 64 y.o. 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   Past Medical History    Past Medical History:  Diagnosis Date  . Abnormal stress test 03/19/2018  . 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 Aortic stenosis    a. 2014 s/p mech AVR ([facility])-->chronic coumadin; b. 08/2018 Echo: Mild AS/AI. Nl fxn/gradient.  . Arthritis   . Atrial fibrillation ([place])   . CAD (coronary artery disease)    a. 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 02/2018 MV [facility]): large inf, infsept, inflat infarct w/ min rev, EF 30%; c. 06/2018 Cath: LM min irregs, LAD 60p, 95/30/44m, LCX min irregs, RCA 40p, 24m, 90d, RPAV 90, RPDA 95ost; c. 09/2018 CABGx3 (LIMA->LAD, VG->D1, VG->PDA).  . Chronic anticoagulation 06/2012   a. Coumadin in the setting of mech AVR and afib.  Marland Kitchen COVID-19 03/15/2019  . CPAP (continuous positive airway pressure) dependence   . Dialysis patient [facility]) 04/07/2006   Patient started HD around 1996,  had 1st transplant LLQ around 1998 until 2007 when they found renal cell cancer in transplant and both native kidneys, all were removed > went back on HD until 2nd transplant RLQ in 2014 which lasted 3 yrs; it was embolized for suspected acute rejection. Is currently on HD MWF in [place] w/ [facility] nephrology.  . ESRD (end stage renal disease) ([place])    a. 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 1999 s/p  Kidney transplant; c. 2014 Bilat nephrectomy (transplanted kidney resected) in the setting of renal cell carcinoma; d. PD until 11/2017-->HD.  Marland Kitchen Heart murmur   . HFrEF (heart failure with reduced ejection fraction) ([place])    a. 07/2016 Echo: EF 55-60%; b. 02/2018 MV: EF 30%; c. 04/2018 Echo: EF 35%; d. 08/2018 Echo: EF 30-35%. Glob HK w/o rwma. Mildly reduced RV fxn. Mod to sev dil LA. Nl AoV fxn.  Marland Kitchen Hx of colonoscopy    2009 by Dr. Laural Golden. Normal except for small submucosal lipoma. No evidence of polyps or colitis.  . Hyperlipidemia   . Hypertension   . Hypogonadism in male 07/25/2015  . Ischemic cardiomyopathy    a. 07/2016 Echo: EF 55-60%; b. 02/2018 MV: EF 30%; c. 04/2018 Echo: EF 35%; d. 08/2018 Echo: EF 30-35%.  . OSA (obstructive sleep apnea)    No CPAP  . Permanent atrial fibrillation ([facility])    a. CHA2DS2VASc = 3-->Coumadin (also mech AVR); b. 06/2018 Amio started 2/2 elevated rates.  . Renal cell cancer ([place])   . Renal cell carcinoma ([place]) 08/04/2016   Past Surgical History:  Procedure Laterality Date  . AORTIC VALVE REPLACEMENT  3 /11 /2014   At [place]    . CAPD INSERTION N/A 02/19/2017   Procedure: LAPAROSCOPIC INSERTION CONTINUOUS AMBULATORY PERITONEAL DIALYSIS  (CAPD) CATHETER;  Surgeon: Algernon Huxley, MD;  Location:  [facility] ORS;  Service: General;  Laterality: N/A;  . CORONARY ANGIOPLASTY WITH STENT PLACEMENT    . CORONARY ARTERY BYPASS GRAFT N/A 09/16/2018   Procedure: CORONARY ARTERY BYPASS GRAFT times three      with left internal mammary artery and endoharvest of right leg greater saphenous vein;  Surgeon: Melrose Nakayama, MD;  Location: [place];  Service: Open Heart Surgery;  Laterality: N/A;  . DG AV DIALYSIS  SHUNT ACCESS EXIST*L* OR     left arm  . EPIGASTRIC HERNIA REPAIR N/A 02/19/2017   Procedure: HERNIA REPAIR EPIGASTRIC ADULT;  Surgeon: Robert Bellow, MD;  Location: [facility] ORS;  Service: General;  Laterality: N/A;  .  EYE SURGERY     bilateral cataract  . HERNIA REPAIR     UMBILICAL HERNIA  . HERNIA REPAIR  02/19/2017  . INNER EAR SURGERY     20 years ago  . KIDNEY TRANSPLANT    . Peritoneal Dialysis access placement  02/19/2017  . REMOVAL OF A DIALYSIS CATHETER N/A 11/23/2017   Procedure: REMOVAL OF A PERITONEAL DIALYSIS CATHETER;  Surgeon: Algernon Huxley, MD;  Location: [facility] ORS;  Service: Vascular;  Laterality: N/A;  . RIGHT/LEFT HEART CATH AND CORONARY ANGIOGRAPHY Bilateral 06/07/2018   Procedure: RIGHT/LEFT HEART CATH AND CORONARY ANGIOGRAPHY;  Surgeon: Wellington Hampshire, MD;  Location: [place] CV LAB;  Service: Cardiovascular;  Laterality: Bilateral;  . TEE WITHOUT CARDIOVERSION N/A 07/21/2016   Procedure: TRANSESOPHAGEAL ECHOCARDIOGRAM (TEE);  Surgeon: Wellington Hampshire, MD;  Location: [facility] ORS;  Service: Cardiovascular;  Laterality: N/A;  . TEE WITHOUT CARDIOVERSION N/A 09/16/2018   Procedure: TRANSESOPHAGEAL ECHOCARDIOGRAM (TEE);  Surgeon: Melrose Nakayama, MD;  Location: [place];  Service: Open Heart Surgery;  Laterality: N/A;    Allergies  Allergies  Allergen Reactions  . Statins     Other reaction(s): Arthralgias (intolerance), Myalgias (intolerance)  Pt taking pravastatin   . Sertraline Rash    History of Present Illness    Jeremy [name] is a 64 y.o. 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 He was last seen 06/06/19.  2013 PCi/DES to LAD with nonobstructive RCA and LCx disease. Previously on PD but switched to HD 11/2017 secondary to ineffective fluid removal. Late 2019 noted significant worsening angina with Lexiscan 02/2018 at [facility] high risk due to large defect which was minimally reversible and EF 30%. Echo  04/2018 LVEF 35%, mildly reduced RV systolic function, moderate biatrial enlargment, mild MR, mild pulmonary hypertension. R/LHC 06/2018 with severe two vessel CAD involving LAD and RCA not optimal for athrectomy andmoderate pulmonary hypertension with normal CO. Placed on Amiodarone as HR difficult to control in setting of atrial fib. Underwent 3-vessel CABG June 2020 (LIMA to LAD, SVG to D1, SVG to PDA). Follow up echo 01/2019 LVEF 45-50%, moderate LVH, mildly reduced RV systolic function, severely dilated LA, 25mm St. Jude mechanical AV prosthesis with mean gradient within normal for prosthesis valve.   Admitted 03/2019 with COVID pneumonia treated with remdesivir, IV steroids, prednisone taper. Developed atrial fibrillation with RVR rates 110s-120s. Noted dyspnea post-COVID. Echo 05/24/19 LVEF reduced to 40-45%, mild LVH. Lopressor switched to Carvedilol. Amiodarone has been discontinued due to his age to prevent adverse effects.   No Crestor  secondary to myalgias (tolerates Pravastatin). Zetia added 04/27/19.   MyChart message 06/20/19 with recent BP readings which he noticed tightness in his chest while sleeping on his left side. Has happened multiple days and has taken one tablet of nitroglycerin with relief on two occasions. Occurs only at resta nd feels like a "squeezing" sensation. Tells me the sensation is similar to his previous anginal symptoms, but not associated with shortness of breath which he had prior to CABG this summer.   Just came from dialysis. Taking midodrine 10mg  when his blood pressure drops and normally has to take one during HD. Was previously recommended by his nephrologist to take upon arrival to HD and agree with this recommendation. BP when ont on HD ranges 127/87-154/86. Has lowered her dry weight a little bit at HD today as they were concerned fluid retention could be contributing to his left chest/flank pain.   Tells me the pain ocurs in his left chest and radiates to his  back. Happens only at rest. Not associated with palpitations. Does endorse that his heart rate has been elevated in the low 100s at home. Asks whether Amiodarone could be resumed, we discussed potential risks/benefits. He is willing to proceed with the recently increased dose of Coreg. May be able to further up titrate.   No orthopnea, PND. No shortness of breath at rest nor activity. No exercise intolerance. No wheeze, cough.   EKGs/Labs/Other Studies Reviewed:   The following studies were reviewed today:  EKG:  EKG is ordered today.  The ekg ordered today demonstrates atrial fibrillation 98 bpm with RBBB.   Recent Labs: 03/15/2019: B Natriuretic Peptide 1,821.0 03/17/2019: Hemoglobin 10.8; Magnesium 2.6; Platelets 121 04/14/2019: BUN 20; Creatinine 4.9; Potassium 4.3; Sodium 143 06/21/2019: ALT 19  Recent Lipid Panel    Component Value Date/Time   CHOL 136 06/21/2019 0932   CHOL 179 04/26/2019 0938   TRIG 150 (H) 06/21/2019 0932   HDL 33 (L) 06/21/2019 0932   HDL 38 (L) 04/26/2019 0938   CHOLHDL 4.1 06/21/2019 0932   VLDL 30 06/21/2019 0932   LDLCALC 73 06/21/2019 0932   LDLCALC 111 (H) 04/26/2019 0938   LDLDIRECT 61.0 08/11/2017 1447   Home Medications   Current Meds  Medication Sig  . acetaminophen (TYLENOL) 500 MG tablet Take 1,000 mg 2 (two) times daily as needed by mouth for moderate pain.  Marland Kitchen allopurinol (ZYLOPRIM) 300 MG tablet Take 300 mg by mouth daily.  Marland Kitchen aspirin EC 81 MG EC tablet Take 1 tablet (81 mg total) by mouth daily.  . carvedilol (COREG) 6.25 MG tablet TAKE 1 TABLET (6.25 MG TOTAL) BY MOUTH 2 (TWO) TIMES DAILY WITH A MEAL.  . cinacalcet (SENSIPAR) 30 MG tablet Take 30 mg by mouth every other day.   . ezetimibe (ZETIA) 10 MG tablet Take 1 tablet (10 mg total) by mouth daily.  . folic acid-vitamin b complex-vitamin c-selenium-zinc (DIALYVITE) 3 MG TABS tablet Take 1 tablet by mouth daily.  Marland Kitchen lidocaine-prilocaine (EMLA) cream Apply 1 application topically as  needed (for fistula).  . multivitamin (RENA-VIT) TABS tablet Take 1 tablet by mouth daily.  . Nutritional Supplements (FEEDING SUPPLEMENT, NEPRO CARB STEADY,) LIQD Take 237 mLs by mouth 3 (three) times daily between meals.  Marland Kitchen omeprazole (PRILOSEC) 20 MG capsule Take 20 mg by mouth daily.  . pravastatin (PRAVACHOL) 20 MG tablet Take 1 tablet (20 mg total) by mouth every evening.  . predniSONE (DELTASONE) 5 MG tablet Take 5 mg by mouth daily with  breakfast.   . sevelamer carbonate (RENVELA) 800 MG tablet Take 40,000 mg by mouth 3 (three) times daily with meals. 5 tabs per meal  . silver sulfADIAZINE (SILVADENE) 1 % cream Apply 1 application topically daily.  . vitamin C (VITAMIN C) 500 MG tablet Take 1 tablet (500 mg total) by mouth daily.  Marland Kitchen warfarin (COUMADIN) 1 MG tablet Take 1-2 tablets (1-2 mg total) by mouth See admin instructions. 1 mg all days , except 0.5 MG on Fridays..* OR AS DIRECTED  . zinc sulfate 220 (50 Zn) MG capsule Take 1 capsule (220 mg total) by mouth daily.      Review of Systems      Review of Systems  Constitution: Negative for chills, fever and malaise/fatigue.  Cardiovascular: Positive for chest pain. Negative for dyspnea on exertion, irregular heartbeat, leg swelling, near-syncope, orthopnea, palpitations and syncope.  Respiratory: Negative for cough, shortness of breath and wheezing.   Gastrointestinal: Negative for melena, nausea and vomiting.  Genitourinary: Negative for hematuria.  Neurological: Negative for dizziness, light-headedness and weakness.   All other systems reviewed and are otherwise negative except as noted above.  Physical Exam    VS:  BP 110/60 (BP Location: Right Arm, Patient Position: Sitting, Cuff Size: Normal)   Pulse 98   Ht 5\' 10"  (1.778 m)   Wt 189 lb (85.7 kg)   SpO2 98%   BMI 27.12 kg/m  , BMI Body mass index is 27.12 kg/m. GEN: Well nourished, well developed, in no acute distress. HEENT: normal. Neck: Supple, no JVD, carotid  bruits, or masses. Cardiac: RRR, no murmurs, rubs, or gallops. No clubbing, cyanosis, edema.  Radials/DP/PT 2+ and equal bilaterally.  Respiratory:  Respirations regular and unlabored, clear to auscultation bilaterally. GI: Soft, nontender, nondistended, BS + x 4. MS: No deformity or atrophy. Skin: Warm and dry, no rash. L forearm fistula noted.  Neuro:  Strength and sensation are intact. Psych: Normal affect.  Assessment & Plan    1. CAD s/p CABG - Noted L sided chest pain wakin ghim from sleep recently. Has taken 1 tablet of nitroglycerin two times with relief. Describes as "squeezing" which is similar to his anginal equivalent, however without dyspnea he noted prior to CABG in June. EKG today with no acute ST/T wave changes.   Continue GDMT aspirin, Coumadin, Coreg, statin, Zetia.   Chest pain etiology PVC due to laying on L side vs volume retention vs CAD. Lexiscan Myoview for chest pain. If unremarkable, consider up-titration of Coreg vs potential addition of Imdur. Will need to monitor BP closely.   Refill of nitroglycerin provided.   2. HFrEF secondary to likely ICM - Echo 05/24/19 with LVEF 40-45%, mild decrease from previous. Etiology ischemia versus previous COVID 19 infection. Euvolemic and well compensated. Volume management per HD. Continue Coreg. No ACE/ARB/MRA secondary to ESRD. Continue low sodium diet and fluid restriction.   3. HLD, LDL goal <70 - Lipid panel 06/21/19 with total 136, HDL 33, LDL 73, triglycerides 150. Hx intolerance to Crestor with myalgia. Tolerating Pravastatin and Zetia (added 04/2019).Lipid panel 06/21/19 with LDL 73. Will review with primary cardiologist, continued monitoring vs increased dose of Pravastatin.   4. Permanent atrial fibrillation - HR 98 bpm today. Coreg recently increased to 6.25mg  BID by Dr. [name]. He has been on increased dose <1 week, will continue to monitor response and consider further increased dose as needed. He has hypotension only  while at HD and does not take this medication prior to HD.  Continue Coumadin for anticoagulation, no bleeding complications.   5. AS s/p mechanical AVR - Stable on recent echo. Continue Coumadin.   6. PVD - Follows with Dr. Lucky Cowboy of vascular services.   7. CKD - Follows with nephrology and HD. Careful titration of antihypertensives. Avoid nephrotoxic agents. Has discussed repeat kidney transplant with transplant team at [place], is unclear whether he wants to proceed as this will be his third transplant and benefit will only likely be a few years per his discussion with his transplant team.   Disposition: Lexiscan [place] for chest pain. Follow up in 4 week(s) with Dr. [name] or APP.    Loel Dubonnet, NP 06/24/2019, 3:09 PM

## 2019-06-29 LAB — POCT INR: INR: 2.5 (ref 2.0–3.0)

## 2019-06-30 ENCOUNTER — Ambulatory Visit (INDEPENDENT_AMBULATORY_CARE_PROVIDER_SITE_OTHER): Payer: Medicare Other

## 2019-06-30 DIAGNOSIS — I4821 Permanent atrial fibrillation: Secondary | ICD-10-CM

## 2019-06-30 DIAGNOSIS — Z952 Presence of prosthetic heart valve: Secondary | ICD-10-CM | POA: Diagnosis not present

## 2019-06-30 NOTE — Patient Instructions (Signed)
Description   Sent pt MyChart message and advised him to continue warfarin dosage of 1 tablet every day. Recheck INR in 1 week.

## 2019-07-01 ENCOUNTER — Encounter: Payer: Self-pay | Admitting: Family

## 2019-07-04 ENCOUNTER — Ambulatory Visit (INDEPENDENT_AMBULATORY_CARE_PROVIDER_SITE_OTHER): Payer: Medicare Other

## 2019-07-04 DIAGNOSIS — I4821 Permanent atrial fibrillation: Secondary | ICD-10-CM | POA: Diagnosis not present

## 2019-07-04 DIAGNOSIS — N189 Chronic kidney disease, unspecified: Secondary | ICD-10-CM | POA: Diagnosis not present

## 2019-07-04 DIAGNOSIS — D631 Anemia in chronic kidney disease: Secondary | ICD-10-CM

## 2019-07-04 DIAGNOSIS — Z952 Presence of prosthetic heart valve: Secondary | ICD-10-CM | POA: Diagnosis not present

## 2019-07-04 LAB — POCT INR: INR: 2.2 (ref 2.0–3.0)

## 2019-07-04 LAB — LAB REPORT - SCANNED: INR: 2.2

## 2019-07-04 NOTE — Patient Instructions (Signed)
Sent pt MyChart message and advised him to take 2 tablets warfarin today and then continue warfarin dosage of 1 tablet every day. Recheck INR in 1 week.

## 2019-07-05 ENCOUNTER — Encounter (INDEPENDENT_AMBULATORY_CARE_PROVIDER_SITE_OTHER): Payer: Self-pay | Admitting: Vascular Surgery

## 2019-07-05 ENCOUNTER — Ambulatory Visit (INDEPENDENT_AMBULATORY_CARE_PROVIDER_SITE_OTHER): Payer: Medicare Other | Admitting: Vascular Surgery

## 2019-07-05 ENCOUNTER — Other Ambulatory Visit: Payer: Self-pay

## 2019-07-05 VITALS — BP 118/68 | HR 97 | Resp 16 | Wt 188.0 lb

## 2019-07-05 DIAGNOSIS — I7025 Atherosclerosis of native arteries of other extremities with ulceration: Secondary | ICD-10-CM | POA: Diagnosis not present

## 2019-07-05 DIAGNOSIS — E785 Hyperlipidemia, unspecified: Secondary | ICD-10-CM

## 2019-07-05 DIAGNOSIS — Z992 Dependence on renal dialysis: Secondary | ICD-10-CM

## 2019-07-05 DIAGNOSIS — N186 End stage renal disease: Secondary | ICD-10-CM | POA: Diagnosis not present

## 2019-07-05 DIAGNOSIS — I2510 Atherosclerotic heart disease of native coronary artery without angina pectoris: Secondary | ICD-10-CM | POA: Diagnosis not present

## 2019-07-05 NOTE — Assessment & Plan Note (Signed)
Certainly slows his wound healing.  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

## 2019-07-05 NOTE — Assessment & Plan Note (Signed)
The patient has history of coronary bypass and now is having more cardiac issues.  He wants to get this addressed before he addresses his lower extremity ulcerations which is certainly reasonable.

## 2019-07-05 NOTE — Assessment & Plan Note (Signed)
They are slowly improving but have still not entirely healed on the right foot.  He is having more issues with his cardiac status at this point.  He is scheduled to have a stress test next week and possibly a cardiac catheterization.  With this present, he is hesitant to proceed with any lower extremity intervention at this time which is reasonable.  I do think he will ultimately require intervention to get this to heal, but will delay this for several weeks to get his cardiac situation cleared up.  He will follow-up in our office in about a month.

## 2019-07-05 NOTE — Progress Notes (Signed)
MRN : [id number]  Jeremy [name] is a 64 y.o. (Feb 06, 1956) male who presents with chief complaint of  Chief Complaint  Patient presents with  . Follow-up    4week wound check  .  History of Present Illness: Patient returns today in follow up of his lower extremity wounds with atherosclerosis.  They are slowly improving but have still not entirely healed on the right foot.  He is having more issues with his cardiac status at this point.  He is scheduled to have a stress test next week and possibly a cardiac catheterization.  With this present, he is hesitant to proceed with any lower extremity intervention at this time which is reasonable.  Current Outpatient Medications  Medication Sig Dispense Refill  . acetaminophen (TYLENOL) 500 MG tablet Take 1,000 mg 2 (two) times daily as needed by mouth for moderate pain.    Marland Kitchen allopurinol (ZYLOPRIM) 300 MG tablet Take 300 mg by mouth daily.  5  . aspirin EC 81 MG EC tablet Take 1 tablet (81 mg total) by mouth daily.    . carvedilol (COREG) 6.25 MG tablet TAKE 1 TABLET (6.25 MG TOTAL) BY MOUTH 2 (TWO) TIMES DAILY WITH A MEAL. 180 tablet 1  . cinacalcet (SENSIPAR) 30 MG tablet Take 30 mg by mouth every other day.     . ezetimibe (ZETIA) 10 MG tablet Take 1 tablet (10 mg total) by mouth daily. 90 tablet 3  . folic acid-vitamin b complex-vitamin c-selenium-zinc (DIALYVITE) 3 MG TABS tablet Take 1 tablet by mouth daily.    Marland Kitchen lidocaine-prilocaine (EMLA) cream Apply 1 application topically as needed (for fistula).    . multivitamin (RENA-VIT) TABS tablet Take 1 tablet by mouth daily.    . nitroGLYCERIN (NITROSTAT) 0.4 MG SL tablet Place 1 tablet (0.4 mg total) under the tongue every 5 (five) minutes as needed for chest pain. 25 tablet 3  . Nutritional Supplements (FEEDING SUPPLEMENT, NEPRO CARB STEADY,) LIQD Take 237 mLs by mouth 3 (three) times daily between meals. 237 mL 3  . omeprazole (PRILOSEC) 20 MG capsule Take 20 mg by mouth daily.    . pravastatin  (PRAVACHOL) 20 MG tablet Take 1 tablet (20 mg total) by mouth every evening. 90 tablet 3  . predniSONE (DELTASONE) 5 MG tablet Take 5 mg by mouth daily with breakfast.     . sevelamer carbonate (RENVELA) 800 MG tablet Take 40,000 mg by mouth 3 (three) times daily with meals. 5 tabs per meal    . silver sulfADIAZINE (SILVADENE) 1 % cream Apply 1 application topically daily. 50 g 0  . vitamin C (VITAMIN C) 500 MG tablet Take 1 tablet (500 mg total) by mouth daily. 30 tablet 0  . warfarin (COUMADIN) 1 MG tablet Take 1-2 tablets (1-2 mg total) by mouth See admin instructions. 1 mg all days , except 0.5 MG on Fridays..* OR AS DIRECTED 180 tablet 1  . zinc sulfate 220 (50 Zn) MG capsule Take 1 capsule (220 mg total) by mouth daily. 30 capsule 0  . albuterol (VENTOLIN HFA) 108 (90 Base) MCG/ACT inhaler Inhale 2 puffs into the lungs every 4 (four) hours as needed for wheezing or shortness of breath. (Patient not taking: Reported on 06/07/2019) 6.7 g 0  . guaiFENesin (MUCINEX) 600 MG 12 hr tablet Take 1 tablet (600 mg total) by mouth 2 (two) times daily as needed for cough or to loosen phlegm. (Patient not taking: Reported on 06/07/2019) 30 tablet 0  . ipratropium (  ATROVENT HFA) 17 MCG/ACT inhaler Inhale 2 puffs into the lungs every 4 (four) hours. (Patient not taking: Reported on 06/07/2019) 1 Inhaler 12   No current facility-administered medications for this visit.    Past Medical History:  Diagnosis Date  . Abnormal stress test 03/19/2018  . 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 Aortic stenosis    a. 2014 s/p mech AVR ([facility])-->chronic coumadin; b. 08/2018 Echo: Mild AS/AI. Nl fxn/gradient.  . Arthritis   . Atrial fibrillation ([place]   . CAD (coronary artery disease)    a. 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 02/2018 MV [facility]): large inf, infsept, inflat infarct w/ min rev, EF 30%; c. 06/2018 Cath: LM min irregs, LAD 60p, 95/30/47m, LCX  min irregs, RCA 40p, 72m, 90d, RPAV 90, RPDA 95ost; c. 09/2018 CABGx3 (LIMA->LAD, VG->D1, VG->PDA).  . Chronic anticoagulation 06/2012   a. Coumadin in the setting of mech AVR and afib.  Marland Kitchen COVID-19 03/15/2019  . CPAP (continuous positive airway pressure) dependence   . Dialysis patient [facility]) 04/07/2006   Patient started HD around 1996,  had 1st transplant LLQ around 1998 until 2007 when they found renal cell cancer in transplant and both native kidneys, all were removed > went back on HD until 2nd transplant RLQ in 2014 which lasted 3 yrs; it was embolized for suspected acute rejection. Is currently on HD MWF in [place] w/ [facility] nephrology.  . ESRD (end stage renal disease) ([place])    a. CKD 2/2 to IgA nephropathy (dx age 25); b. 1999 s/p Kidney transplant; c. 2014 Bilat nephrectomy (transplanted kidney resected) in the setting of renal cell carcinoma; d. PD until 11/2017-->HD.  Marland Kitchen Heart murmur   . HFrEF (heart failure with reduced ejection fraction) ([place])    a. 07/2016 Echo: EF 55-60%; b. 02/2018 MV: EF 30%; c. 04/2018 Echo: EF 35%; d. 08/2018 Echo: EF 30-35%. Glob HK w/o rwma. Mildly reduced RV fxn. Mod to sev dil LA. Nl AoV fxn.  Marland Kitchen Hx of colonoscopy    2009 by Dr. Laural Golden. Normal except for small submucosal lipoma. No evidence of polyps or colitis.  . Hyperlipidemia   . Hypertension   . Hypogonadism in male 07/25/2015  . Ischemic cardiomyopathy    a. 07/2016 Echo: EF 55-60%; b. 02/2018 MV: EF 30%; c. 04/2018 Echo: EF 35%; d. 08/2018 Echo: EF 30-35%.  . OSA (obstructive sleep apnea)    No CPAP  . Permanent atrial fibrillation (H[facility])    a. CHA2DS2VASc = 3-->Coumadin (also mech AVR); b. 06/2018 Amio started 2/2 elevated rates.  . Renal cell cancer ([place])   . Renal cell carcinoma ([place]) 08/04/2016    Past Surgical History:  Procedure Laterality Date  . AORTIC VALVE REPLACEMENT  3 /11 /2014   [place]    . CAPD INSERTION N/A 02/19/2017   Procedure:  LAPAROSCOPIC INSERTION CONTINUOUS AMBULATORY PERITONEAL DIALYSIS  (CAPD) CATHETER;  Surgeon: Algernon Huxley, MD;  Location: [facility];  Service: General;  Laterality: N/A;  . CORONARY ANGIOPLASTY WITH STENT PLACEMENT    . CORONARY ARTERY BYPASS GRAFT N/A 09/16/2018   Procedure: CORONARY ARTERY BYPASS GRAFT times three      with left internal mammary artery and endoharvest of right leg greater saphenous vein;  Surgeon: Melrose Nakayama, MD;  Location: [place];  Service: Open Heart Surgery;  Laterality: N/A;  . DG  AV DIALYSIS  SHUNT ACCESS EXIST*L* OR     left arm  . EPIGASTRIC HERNIA REPAIR N/A 02/19/2017   Procedure: HERNIA REPAIR EPIGASTRIC ADULT;  Surgeon: Robert Bellow, MD;  Location: [facility];  Service: General;  Laterality: N/A;  . EYE SURGERY     bilateral cataract  . HERNIA REPAIR     UMBILICAL HERNIA  . HERNIA REPAIR  02/19/2017  . INNER EAR SURGERY     20 years ago  . KIDNEY TRANSPLANT    . Peritoneal Dialysis access placement  02/19/2017  . REMOVAL OF A DIALYSIS CATHETER N/A 11/23/2017   Procedure: REMOVAL OF A PERITONEAL DIALYSIS CATHETER;  Surgeon: Algernon Huxley, MD;  Location: [facility];  Service: Vascular;  Laterality: N/A;  . RIGHT/LEFT HEART CATH AND CORONARY ANGIOGRAPHY Bilateral 06/07/2018   Procedure: RIGHT/LEFT HEART CATH AND CORONARY ANGIOGRAPHY;  Surgeon: Wellington Hampshire, MD;  Location: [place] [facility];  Service: Cardiovascular;  Laterality: Bilateral;  . TEE WITHOUT CARDIOVERSION N/A 07/21/2016   Procedure: TRANSESOPHAGEAL ECHOCARDIOGRAM (TEE);  Surgeon: Wellington Hampshire, MD;  Location: [facility];  Service: Cardiovascular;  Laterality: N/A;  . TEE WITHOUT CARDIOVERSION N/A 09/16/2018   Procedure: TRANSESOPHAGEAL ECHOCARDIOGRAM (TEE);  Surgeon: Melrose Nakayama, MD;  Location: [place];  Service: Open Heart Surgery;  Laterality: N/A;     Social History   Tobacco Use  . Smoking status: Never Smoker  . Smokeless tobacco: Never Used  Substance Use Topics  .  Alcohol use: No  . Drug use: No    Family History  Problem Relation Age of Onset  . Cancer Mother        pancreatic  . Heart disease Father   . Diabetes Father      Allergies  Allergen Reactions  . Statins     Other reaction(s): Arthralgias (intolerance), Myalgias (intolerance)  Pt taking pravastatin   . Sertraline Rash     Constitutional: [] ?Weight loss[] ?Fever[] ?Chills Cardiac:[] ?Chest pain[] ? Atrial Fibrillation[] ?Palpitations [] ?Shortness of breath when laying flat [] ?Shortness of breath with exertion. [] ?Shortness of breath at rest Vascular: [] ?Pain in legs with walking[] ?Pain in legswith standing[] ?Pain in legs when laying flat [] ?Claudication  [] ?Pain in feet when laying flat [] ?History of DVT [] ?Phlebitis [] ?Swelling in legs [] ?Varicose veins [x] ?Non-healing ulcers Pulmonary: [] ?Uses home oxygen [] ?Productive cough[] ?Hemoptysis [] ?Wheeze [] ?COPD [] ?Asthma Neurologic: [] ?Dizziness[] ?Seizures [] ?Blackouts[] ?History of stroke [] ?History of TIA[] ?Aphasia [] ?Temporary Blindness[] ?Weaknessor numbness in arm [] ?Weakness or numbnessin leg Musculoskeletal:[] ?Joint swelling [] ?Joint pain [] ?Low back pain  [] ? History of Knee Replacement [x] ?Arthritis [] ?back Surgeries[] ? Spinal Stenosis  Hematologic:[] ?Easy bruising[] ?Easy bleeding [] ?Hypercoagulable state [x] ?Anemic Gastrointestinal:[] ?Diarrhea [] ?Vomiting[] ?Gastroesophageal reflux/heartburn[] ?Difficulty swallowing. [] ?Abdominal pain Genitourinary: [x] ?Chronic kidney disease [] ?Difficulturination [] ?Anuric[] ?Blood in urine [] ?Frequenturination [] ?Burning with urination[] ?Hematuria Skin: [] ?Rashes [x] ?Ulcers [] ?Wounds Psychological: [] ?History of anxiety[x] ?History of major depression  [] ? Memory Difficulties  Physical Examination  BP 118/68 (BP Location: Right Arm)   Pulse 97   Resp 16   Wt 188 lb (85.3 kg)   BMI  26.98 kg/m  Gen:  WD/WN, NAD Head: [place]/AT, No temporalis wasting. Ear/Nose/Throat: Hearing grossly intact, nares w/o erythema or drainage Eyes: Conjunctiva clear. Sclera non-icteric Neck: Supple.  Trachea midline Pulmonary:  Good air movement, no use of accessory muscles.  Cardiac: RRR, no JVD Vascular:  Vessel Right Left  Radial Palpable Palpable                          PT Not Palpable Not Palpable  DP Not Palpable Not Palpable    Musculoskeletal: M/S 5/5 throughout.  No  deformity or atrophy. No edema. Neurologic: Sensation grossly intact in extremities.  Symmetrical.  Speech is fluent.  Psychiatric: Judgment intact, Mood & affect appropriate for pt's clinical situation. Dermatologic: Wound on the left foot is now just a small scab.  Less than 1 cm circular ulceration on the lateral aspect of the right foot overlying the fifth metatarsal head.  This is pale with poor granulation tissue.  No erythema or signs of infection.       Labs Recent Results (from the past 2160 hour(s))  POCT INR     Status: None   Collection Time: 04/11/19 12:04 PM  Result Value Ref Range   INR 2.7 2.0 - 3.0  Basic metabolic panel     Status: Abnormal   Collection Time: 04/14/19 12:00 AM  Result Value Ref Range   Glucose 98    BUN 20 4 - 21   CO2 30 (A) 13 - 22   Creatinine 4.9 (A) 0.6 - 1.3   Potassium 4.3 3.4 - 5.3   Sodium 143 137 - 147   Chloride 96 (A) 99 - 108  Comprehensive metabolic panel     Status: None   Collection Time: 04/14/19 12:00 AM  Result Value Ref Range   GFR calc Af Amer 14    GFR calc non Af Amer 12    Calcium 8.7 8.7 - 10.7   Albumin 4.3 3.5 - 5.0  Hepatic function panel     Status: Abnormal   Collection Time: 04/14/19 12:00 AM  Result Value Ref Range   Alkaline Phosphatase 241 (A) 25 - 125   ALT 15 10 - 40   AST 18 14 - 40   Bilirubin, Total 0.5   POCT INR     Status: None   Collection Time: 04/18/19 12:00 AM  Result Value Ref Range   INR 2.2 2.0 - 3.0      Comment: results faxed  POCT INR     Status: None   Collection Time: 04/25/19 12:00 AM  Result Value Ref Range   INR 2.2 2.0 - 3.0    Comment: results faxed from [place]  Lab report - scanned     Status: None   Collection Time: 04/25/19 12:00 AM  Result Value Ref Range   INR 2.2   Hepatic function panel     Status: Abnormal   Collection Time: 04/26/19  9:38 AM  Result Value Ref Range   Total Protein 7.1 6.0 - 8.5 g/dL   Albumin 4.3 3.8 - 4.8 g/dL   Bilirubin Total 0.5 0.0 - 1.2 mg/dL   Bilirubin, Direct 0.18 0.00 - 0.40 mg/dL   Alkaline Phosphatase 211 (H) 39 - 117 IU/L   AST 18 0 - 40 IU/L   ALT 14 0 - 44 IU/L  Lipid panel     Status: Abnormal   Collection Time: 04/26/19  9:38 AM  Result Value Ref Range   Cholesterol, Total 179 100 - 199 mg/dL   Triglycerides 167 (H) 0 - 149 mg/dL   HDL 38 (L) >39 mg/dL   VLDL Cholesterol Cal 30 5 - 40 mg/dL   LDL Chol Calc (NIH) 111 (H) 0 - 99 mg/dL   Chol/HDL Ratio 4.7 0.0 - 5.0 ratio    Comment:                                   T. Chol/HDL Ratio  Men  Women                               1/2 Avg.Risk  3.4    3.3                                   Avg.Risk  5.0    4.4                                2X Avg.Risk  9.6    7.1                                3X Avg.Risk 23.4   11.0   POCT INR     Status: None   Collection Time: 05/02/19 12:00 AM  Result Value Ref Range   INR 2.6 2.0 - 3.0    Comment: results faxed from [facility]  POCT INR     Status: None   Collection Time: 05/09/19 12:00 AM  Result Value Ref Range   INR 2.3 2.0 - 3.0    Comment: pt home tester calling w/ results  Lab report - scanned     Status: None   Collection Time: 05/09/19 12:00 AM  Result Value Ref Range   INR 2.3   POCT INR     Status: Abnormal   Collection Time: 05/16/19 12:00 AM  Result Value Ref Range   INR 3.5 (A) 2.0 - 3.0    Comment: pt calling w/ home results  POCT INR     Status: None   Collection Time:  05/25/19 12:00 AM  Result Value Ref Range   INR 2.6 2.0 - 3.0    Comment: [facility] faxed results  Protime-INR     Status: None   Collection Time: 05/25/19 12:00 AM  Result Value Ref Range   INR    Lab report - scanned     Status: None   Collection Time: 05/25/19 12:00 AM  Result Value Ref Range   INR 2.6   POCT INR     Status: None   Collection Time: 05/30/19 12:00 AM  Result Value Ref Range   INR 2.7 2.0 - 3.0    Comment: [facility] faxed results  Lab report - scanned     Status: None   Collection Time: 05/30/19 12:00 AM  Result Value Ref Range   INR 2.7   POCT INR     Status: None   Collection Time: 06/06/19 12:00 AM  Result Value Ref Range   INR 2.5 2.0 - 3.0    Comment: results faxed from [facility]  POCT INR     Status: None   Collection Time: 06/15/19 12:00 AM  Result Value Ref Range   INR 2.4 2.0 - 3.0    Comment: [facility]IN[facility] results faxed over  Hepatic function panel     Status: Abnormal   Collection Time: 06/21/19  9:32 AM  Result Value Ref Range   Total Protein 7.6 6.5 - 8.1 g/dL   Albumin 3.9 3.5 - 5.0 g/dL   AST 23 15 - 41 U/L   ALT 19 0 - 44 U/L   Alkaline Phosphatase 152 (H) 38 - 126 U/L   Total Bilirubin 0.7 0.3 - 1.2 mg/dL  Bilirubin, Direct <0.1 0.0 - 0.2 mg/dL   Indirect Bilirubin NOT CALCULATED 0.3 - 0.9 mg/dL    Comment: Performed at [facility], [address] [place], [place] [address]  Lipid panel     Status: Abnormal   Collection Time: 06/21/19  9:32 AM  Result Value Ref Range   Cholesterol 136 0 - 200 mg/dL   Triglycerides 150 (H) <150 mg/dL   HDL 33 (L) >40 mg/dL   Total CHOL/HDL Ratio 4.1 RATIO   VLDL 30 0 - 40 mg/dL   LDL Cholesterol 73 0 - 99 mg/dL    Comment:        Total Cholesterol/HDL:CHD Risk Coronary Heart Disease Risk Table                     Men   Women  1/2 Average Risk   3.4   3.3  Average Risk       5.0   4.4  2 X Average Risk   9.6   7.1  3 X Average Risk  23.4   11.0        Use the calculated Patient Ratio above and  the CHD Risk Table to determine the patient's CHD Risk.        ATP III CLASSIFICATION (LDL):  <100     mg/dL   Optimal  100-129  mg/dL   Near or Above                    Optimal  130-159  mg/dL   Borderline  160-189  mg/dL   High  >190     mg/dL   Very High Performed at [facility], [address] [place], [place] [address]   POCT INR     Status: None   Collection Time: 06/22/19 12:00 AM  Result Value Ref Range   INR 3.0 2.0 - 3.0    Comment: results faxed from [place]  Lab report - scanned     Status: None   Collection Time: 06/22/19 12:00 AM  Result Value Ref Range   INR 3.0   Lab report - scanned     Status: None   Collection Time: 06/22/19 12:00 AM  Result Value Ref Range   INR 3.0   POCT INR     Status: None   Collection Time: 06/29/19 12:00 AM  Result Value Ref Range   INR 2.5 2.0 - 3.0    Comment: self-tester  Lab report - scanned     Status: None   Collection Time: 07/04/19 12:00 AM  Result Value Ref Range   INR 2.2   POCT INR     Status: None   Collection Time: 07/04/19  1:18 PM  Result Value Ref Range   INR 2.2 2.0 - 3.0    Comment: results faxed from [facility]    Radiology VAS US AORTA/IVC/ILIACS  Result Date: 06/07/2019 ABDOMINAL AORTA STUDY Limitations: Air/bowel gas and obesity.  Performing Technologist: Charlane Ferretti RT (R)(VS)  Examination Guidelines: A complete evaluation includes B-mode imaging, spectral Doppler, color Doppler, and power Doppler as needed of all accessible portions of each vessel. Bilateral testing is considered an integral part of a complete examination. Limited examinations for reoccurring indications may be performed as noted.  Abdominal Aorta Findings: +-----------+-------+----------+----------+--------+--------+--------+ Location   AP (cm)Trans (cm)PSV (cm/s)WaveformThrombusComments +-----------+-------+----------+----------+--------+--------+--------+ Proximal   2.10   2.09      92                                  +-----------+-------+----------+----------+--------+--------+--------+  Mid        1.91   1.94      92                                 +-----------+-------+----------+----------+--------+--------+--------+ Distal     1.54   1.47      89                                 +-----------+-------+----------+----------+--------+--------+--------+ RT CIA Prox1.0    1.1       127                                +-----------+-------+----------+----------+--------+--------+--------+ RT EIA Prox0.8    1.0       80                                 +-----------+-------+----------+----------+--------+--------+--------+ LT CIA Prox1.1    1.4       119                                +-----------+-------+----------+----------+--------+--------+--------+ LT EIA Prox0.9    1.0       77                                 +-----------+-------+----------+----------+--------+--------+--------+  Summary: Abdominal Aorta: No evidence of an abdominal aortic aneurysm was visualized. The largest aortic measurement is 2.1 cm. No previous exam available for comparison.  *See table(s) above for measurements and observations.  Electronically signed by Leotis Pain MD on 06/07/2019 at 8:59:10 AM.   Final     Assessment/Plan  Hyperlipidemia lipid control important in reducing the progression of atherosclerotic disease. Continue statin therapy   ESRD on dialysis [facility]) Certainly slows his wound healing.  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  CAD (coronary artery disease) The patient has history of coronary bypass and now is having more cardiac issues.  He wants to get this addressed before he addresses his lower extremity ulcerations which is certainly reasonable.  Atherosclerosis of native arteries of the extremities with ulceration ([place]) They are slowly improving but have still not entirely healed on the right foot.  He is having more issues with his  cardiac status at this point.  He is scheduled to have a stress test next week and possibly a cardiac catheterization.  With this present, he is hesitant to proceed with any lower extremity intervention at this time which is reasonable.  I do think he will ultimately require intervention to get this to heal, but will delay this for several weeks to get his cardiac situation cleared up.  He will follow-up in our office in about a month.    Leotis Pain, MD  07/05/2019 10:06 AM    This note was created with Dragon medical transcription system.  Any errors from dictation are purely unintentional

## 2019-07-05 NOTE — Assessment & Plan Note (Signed)
lipid control important in reducing the progression of atherosclerotic disease. Continue statin therapy  

## 2019-07-07 ENCOUNTER — Other Ambulatory Visit: Payer: Self-pay

## 2019-07-07 ENCOUNTER — Encounter
Admission: RE | Admit: 2019-07-07 | Discharge: 2019-07-07 | Disposition: A | Discharge status: Home / Self Care | Payer: Medicare Other | Source: Ambulatory Visit | Attending: Family | Admitting: Family

## 2019-07-07 DIAGNOSIS — R079 Chest pain, unspecified: Secondary | ICD-10-CM

## 2019-07-07 LAB — NM MYOCAR MULTI W/SPECT W/WALL MOTION / EF
LV dias vol: 179 mL (ref 62–150)
LV sys vol: 117 mL
Peak HR: 110 {beats}/min
Rest HR: 90 {beats}/min
SDS: 11
SRS: 22
SSS: 25
TID: 0.39

## 2019-07-07 MED ORDER — TECHNETIUM TC 99M TETROFOSMIN IV KIT
32.5700 | PACK | Freq: Once | INTRAVENOUS | Status: AC | PRN
  Administered 2019-07-07: 32.57 via INTRAVENOUS

## 2019-07-07 MED ORDER — REGADENOSON 0.4 MG/5ML IV SOLN
0.4000 mg | Freq: Once | INTRAVENOUS | Status: AC
  Administered 2019-07-07: 0.4 mg via INTRAVENOUS
  Filled 2019-07-07: qty 5

## 2019-07-07 MED ORDER — TECHNETIUM TC 99M TETROFOSMIN IV KIT
10.4000 | PACK | Freq: Once | INTRAVENOUS | Status: AC | PRN
  Administered 2019-07-07: 10.4 via INTRAVENOUS

## 2019-07-08 ENCOUNTER — Other Ambulatory Visit: Payer: Self-pay | Admitting: Family

## 2019-07-08 ENCOUNTER — Telehealth: Payer: Self-pay | Admitting: *Deleted

## 2019-07-08 DIAGNOSIS — I2510 Atherosclerotic heart disease of native coronary artery without angina pectoris: Secondary | ICD-10-CM

## 2019-07-08 NOTE — Telephone Encounter (Signed)
Reviewed results of [place] 07/07/19 with Mr. Baskette by telephone.   Mr. Gloria has a hx of ESRD on HD, CAD s/p CABG 09/2018, HLD, PAD/PVD, HFrEF/ICM. Cath 06/2018 with severe two-vessel disease to LAD/RCA. Recommended for CABG. Underwent CABG x4 09/2018 (LIMA to LAD, SVG to D1, SVG to PDA). Echo 05/2019 LVEF 40-45%. Metoprolol switched to Coreg due to reduced EF for optimization of GDMT. Office visit reporting chest pain relieved by nitroglycerin, subsequent Lexiscan Myov[place] 07/07/19 was high risk due to significant scar and estimated EF 27%. Full report below.  Lexiscan [place] 07/07/19 Pharmacological myocardial perfusion imaging study with large region of fixed defect in the inferior, inferolateral wall consistent with prior infarct Moderate-sized region of ischemia noted in the mid anterior wall, mid lateral wall extending to apical lateral region Hypokinesis of the inferior and apical region EF estimated at 27% No EKG changes concerning for ischemia at peak stress or in recovery. Resting EKG with atrial fibrillation, right bundle branch block High risk scan, given large region of scar, severely depressed ejection fraction  Dr. Rockey Situ reviewed images with Christell Faith, PA in office as I was out of office. Recommendation to proceed with LHC due to concern for diagonal and rPDA graft patency. Reviewed Lexiscan results with Mr. Melody. Reviewed recommendation as well as risks and benefit for cardiac catheterization. His most recent cath was performed via groin due to hx of vascular access for HD in left arm. The patient understands that risks include but are not limited to stroke (1 in 1000), death (1 in 75), kidney failure [usually temporary] (1 in 500), bleeding (1 in 200), allergic reaction [possibly serious] (1 in 200), and agrees to proceed.   Loel Dubonnet, NP

## 2019-07-08 NOTE — Telephone Encounter (Signed)
Dr. [name]- are you ok with a 3-4 day hold or do you want a full 5 day hold of warfarin?

## 2019-07-08 NOTE — Telephone Encounter (Signed)
I have spoken with Jeremy [place], NP to clarify 2 things: 1) the patient is on warfarin for an AVR- will he need lovenox bridging to come off warfarin 2) he had had a kidney transplant- how will this affect his hydration pre-cath  Per Jeremy [name], the patient will require lovenox bridging prior to his cath. She will write orders for no hydration- keep at [facility] pre cath.   I have spoken with the patient and advised him that we are looking to schedule his cath on Friday 07/15/19 with Dr. [name], but this will need to be done in [place]. He inquired about the following week, but Monday 4/12, the [facility] cath lab is closed for biomed training of some sort.  He is agreeable with a cath on Friday 07/15/19. He states he typically had HD from 10-2:30 pm on Fridays. He will speak with the HD center on Monday to see if he can come in early on Friday 4/9 and have his HD prior to coming to [facility] for a cath.  The patient is aware we will reach out to him on Monday 4/5 to see what time his HD can be changed to and then coordinate his cath.  I will reach out to [facility] in [place] in the interim to assist with lovenox bridging as Jeremy [name] is out of the office next week.

## 2019-07-08 NOTE — Telephone Encounter (Signed)
-----   Message from Jeremy Dubonnet, NP sent at 07/08/2019  1:56 PM EDT ----- Regarding: Cardiac cath needed I just reviewed Jeremy [name] stress test results by phone with him (working remotely & will put in phone note later this afternoon including consent).   Based of his stress test concern for new area of ischemia (discussed with Jeremy [name]). Jeremy [name] has agreed to left heart cath with Jeremy [name] next week for angina.   Will you please assist me to contact him & schedule please? I told him we would call him later today to set up. And I will put in orders this afternoon.   Thanks so much!

## 2019-07-08 NOTE — Telephone Encounter (Signed)
-----   Message from Loel Dubonnet, NP sent at 07/08/2019  1:56 PM EDT ----- Regarding: Cardiac cath needed I just reviewed Jeremy [name] stress test results by phone with him (working remotely & will put in phone note later this afternoon including consent).   Based of his stress test concern for new area of ischemia (discussed with Dr. Rockey Situ). Jeremy [name] has agreed to left heart cath with Dr. [name] next week for angina.   Will you please assist me to contact him & schedule please? I told him we would call him later today to set up. And I will put in orders this afternoon.   Thanks so much!

## 2019-07-11 ENCOUNTER — Ambulatory Visit (INDEPENDENT_AMBULATORY_CARE_PROVIDER_SITE_OTHER): Payer: Medicare Other | Admitting: Pharmacist

## 2019-07-11 ENCOUNTER — Encounter: Payer: Self-pay | Admitting: Cardiovascular Disease

## 2019-07-11 DIAGNOSIS — Z952 Presence of prosthetic heart valve: Secondary | ICD-10-CM | POA: Diagnosis not present

## 2019-07-11 DIAGNOSIS — I4821 Permanent atrial fibrillation: Secondary | ICD-10-CM

## 2019-07-11 LAB — POCT INR: INR: 2.1 (ref 2.0–3.0)

## 2019-07-11 MED ORDER — ENOXAPARIN SODIUM 80 MG/0.8ML ~~LOC~~ SOLN: 80.0000 mg | SUBCUTANEOUS | 0 refills | Status: DC

## 2019-07-11 NOTE — Telephone Encounter (Signed)
I spoke with the patient regarding Dr. Tyrell Antonio recommendations to: 1) Admit him on Wednesday for pre-hydration 2) schedule a Cardiac Cath for Friday 4/9 3) Hold warfarin for 3 days prior to his cath - the patient states that he was on the phone with Edythe Lynn D when I called him previously.   The patient is agreeable with the above plan of care. I have advised him he will need to come tomorrow morning around 8 am to the PAT area at the [facility] at [facility] for a COVID swab.  I have also advised him that I will call and make a bed reservation for him for Wednesday afternoon after his HD. The patient states he will be done at HD ~ 2:30 pm. He could be at [facility] by ~ 3:30 pm.   I have advised the patient I will request bed control call him for an afternoon admission on Wednesday. However, I have also advised him if he has not heard from bed control by 4:00 pm that day, he needs to call the office so we can follow up on his admission status. The patient voices understanding of the above and is agreeable.

## 2019-07-11 NOTE — Telephone Encounter (Signed)
Secure chat sent to Dr. [name] this morning to follow up on the patient's need for a cardiac cath this week. I advised Dr. [name] that the patient had an AVR and will need lovenox bridging per [facility] and that he is a HD patient- is it ok to admit on Friday after his HD treatment.  Also advised I had reached out to the pharmacy team and they had messaged him as well. I asked that he weigh in and advise any changes with the plan of care.  Message received back from Dr. [name] as below:  GM. He is a very complicated patient. Hold warfarin 3 days before cath. He needs to be admitted on Wednesday preferably after dialysis to start heparin drip as a bridge for cath on Friday.  Per Dr. [name], he will need a telemetry bed.  I attempted to call the patient to advise in the change in the plan of care. No answer- I left a message to please call back. He will need to have COVID testing today/ tomorrow AM in preparation for his Admission on Wednesday.

## 2019-07-11 NOTE — Telephone Encounter (Signed)
I have spoke with Shawn in Bed Control: - telemetry bed requested for Wednesay 07/13/19 (late afternoon)  I have spoken with the Santiago Glad in the Cath lab at [facility]: - left heart cath with cors & grafts scheduled for Friday 07/15/19 at 7:30 am with Dr. [name] (they are aware he is a HD pt and will be admitted Wednesday 4/7 late afternoon (after HD) for hydration- he will need HD on Friday)  I have spoken with Valetta Fuller in PAT at [facility] - the patient's COVID test has been scheduled for tomorrow  Message sent to precert.  Per Dr. [name], he would like Laurann Montana, NP to place orders.  Message sent to [facility] as well.

## 2019-07-11 NOTE — Addendum Note (Signed)
Addended by: Marcelle Overlie D on: 07/11/2019 10:54 AM   Modules accepted: Orders

## 2019-07-11 NOTE — Telephone Encounter (Signed)
Called patient to request he check his INR today for upcoming hold. Patient took INR this AM- it was 2.1 (goal 2.5-3.5) Takes his warfarin at night. Originally was going to bridge patient with lovenox and instructions were provided. However patient is a HD pt and lovenox is not ideal in this patient population since it is not dialyzable and increases risk of bleeding. Per Dr. [name], keep INR on the low side, hold warfarin for 3 days. They will admit patient on Wed for heparin drip.  I called patient and asked him to ignore the previous mychart message and advised him of the new plan. He is to take take 1 tablet of warfarin tonight and then start holding tomorrow. Resume warfarin Friday after procedure. I imagine he will start until at least Sat- ideally should stay until INR therapeutic. Appears he will need a dose increase since his INR has been low x2.

## 2019-07-12 ENCOUNTER — Other Ambulatory Visit
Admission: RE | Admit: 2019-07-12 | Discharge: 2019-07-12 | Disposition: A | Discharge status: Home / Self Care | Payer: Medicare Other | Source: Ambulatory Visit | Attending: Cardiovascular Disease | Admitting: Cardiovascular Disease

## 2019-07-12 DIAGNOSIS — Z01812 Encounter for preprocedural laboratory examination: Secondary | ICD-10-CM | POA: Insufficient documentation

## 2019-07-12 DIAGNOSIS — Z20822 Contact with and (suspected) exposure to covid-19: Secondary | ICD-10-CM | POA: Insufficient documentation

## 2019-07-12 LAB — SARS CORONAVIRUS 2 (TAT 6-24 HRS): SARS Coronavirus 2: NEGATIVE

## 2019-07-13 ENCOUNTER — Inpatient Hospital Stay (HOSPITAL_COMMUNITY)
Admission: RE | Admit: 2019-07-13 | Discharge: 2019-07-18 | DRG: 246 | Disposition: A | Discharge status: Home / Self Care | Payer: Medicare Other | Attending: Cardiovascular Disease | Admitting: Cardiovascular Disease

## 2019-07-13 ENCOUNTER — Other Ambulatory Visit: Payer: Self-pay

## 2019-07-13 ENCOUNTER — Encounter (HOSPITAL_COMMUNITY): Payer: Self-pay | Admitting: Cardiovascular Disease

## 2019-07-13 DIAGNOSIS — Z79899 Other long term (current) drug therapy: Secondary | ICD-10-CM

## 2019-07-13 DIAGNOSIS — I4892 Unspecified atrial flutter: Secondary | ICD-10-CM | POA: Diagnosis present

## 2019-07-13 DIAGNOSIS — I25110 Atherosclerotic heart disease of native coronary artery with unstable angina pectoris: Secondary | ICD-10-CM | POA: Diagnosis present

## 2019-07-13 DIAGNOSIS — Z940 Kidney transplant status: Secondary | ICD-10-CM

## 2019-07-13 DIAGNOSIS — Z8249 Family history of ischemic heart disease and other diseases of the circulatory system: Secondary | ICD-10-CM

## 2019-07-13 DIAGNOSIS — Z952 Presence of prosthetic heart valve: Secondary | ICD-10-CM

## 2019-07-13 DIAGNOSIS — I4510 Unspecified right bundle-branch block: Secondary | ICD-10-CM | POA: Diagnosis present

## 2019-07-13 DIAGNOSIS — N186 End stage renal disease: Secondary | ICD-10-CM | POA: Diagnosis present

## 2019-07-13 DIAGNOSIS — I2510 Atherosclerotic heart disease of native coronary artery without angina pectoris: Secondary | ICD-10-CM | POA: Diagnosis not present

## 2019-07-13 DIAGNOSIS — Q245 Malformation of coronary vessels: Secondary | ICD-10-CM

## 2019-07-13 DIAGNOSIS — I739 Peripheral vascular disease, unspecified: Secondary | ICD-10-CM | POA: Diagnosis present

## 2019-07-13 DIAGNOSIS — E291 Testicular hypofunction: Secondary | ICD-10-CM | POA: Diagnosis present

## 2019-07-13 DIAGNOSIS — Z7982 Long term (current) use of aspirin: Secondary | ICD-10-CM

## 2019-07-13 DIAGNOSIS — D631 Anemia in chronic kidney disease: Secondary | ICD-10-CM | POA: Diagnosis present

## 2019-07-13 DIAGNOSIS — I132 Hypertensive heart and chronic kidney disease with heart failure and with stage 5 chronic kidney disease, or end stage renal disease: Secondary | ICD-10-CM | POA: Diagnosis present

## 2019-07-13 DIAGNOSIS — I255 Ischemic cardiomyopathy: Secondary | ICD-10-CM | POA: Diagnosis present

## 2019-07-13 DIAGNOSIS — N028 Recurrent and persistent hematuria with other morphologic changes: Secondary | ICD-10-CM | POA: Diagnosis present

## 2019-07-13 DIAGNOSIS — I5022 Chronic systolic (congestive) heart failure: Secondary | ICD-10-CM | POA: Diagnosis present

## 2019-07-13 DIAGNOSIS — I25710 Atherosclerosis of autologous vein coronary artery bypass graft(s) with unstable angina pectoris: Secondary | ICD-10-CM | POA: Diagnosis not present

## 2019-07-13 DIAGNOSIS — Z8616 Personal history of COVID-19: Secondary | ICD-10-CM

## 2019-07-13 DIAGNOSIS — Z905 Acquired absence of kidney: Secondary | ICD-10-CM

## 2019-07-13 DIAGNOSIS — Z809 Family history of malignant neoplasm, unspecified: Secondary | ICD-10-CM

## 2019-07-13 DIAGNOSIS — Y840 Cardiac catheterization as the cause of abnormal reaction of the patient, or of later complication, without mention of misadventure at the time of the procedure: Secondary | ICD-10-CM | POA: Diagnosis not present

## 2019-07-13 DIAGNOSIS — Z888 Allergy status to other drugs, medicaments and biological substances status: Secondary | ICD-10-CM

## 2019-07-13 DIAGNOSIS — E875 Hyperkalemia: Secondary | ICD-10-CM | POA: Diagnosis not present

## 2019-07-13 DIAGNOSIS — Z992 Dependence on renal dialysis: Secondary | ICD-10-CM | POA: Diagnosis not present

## 2019-07-13 DIAGNOSIS — I4821 Permanent atrial fibrillation: Secondary | ICD-10-CM | POA: Diagnosis present

## 2019-07-13 DIAGNOSIS — Z7952 Long term (current) use of systemic steroids: Secondary | ICD-10-CM

## 2019-07-13 DIAGNOSIS — G4733 Obstructive sleep apnea (adult) (pediatric): Secondary | ICD-10-CM | POA: Diagnosis present

## 2019-07-13 DIAGNOSIS — M1990 Unspecified osteoarthritis, unspecified site: Secondary | ICD-10-CM | POA: Diagnosis present

## 2019-07-13 DIAGNOSIS — I25810 Atherosclerosis of coronary artery bypass graft(s) without angina pectoris: Secondary | ICD-10-CM | POA: Diagnosis present

## 2019-07-13 DIAGNOSIS — S301XXA Contusion of abdominal wall, initial encounter: Secondary | ICD-10-CM | POA: Diagnosis not present

## 2019-07-13 DIAGNOSIS — Z85528 Personal history of other malignant neoplasm of kidney: Secondary | ICD-10-CM

## 2019-07-13 DIAGNOSIS — Z955 Presence of coronary angioplasty implant and graft: Secondary | ICD-10-CM

## 2019-07-13 DIAGNOSIS — E785 Hyperlipidemia, unspecified: Secondary | ICD-10-CM | POA: Diagnosis present

## 2019-07-13 DIAGNOSIS — I200 Unstable angina: Secondary | ICD-10-CM | POA: Diagnosis present

## 2019-07-13 DIAGNOSIS — Z7901 Long term (current) use of anticoagulants: Secondary | ICD-10-CM

## 2019-07-13 LAB — CBC WITH DIFFERENTIAL/PLATELET
Abs Immature Granulocytes: 0.02 10*3/uL (ref 0.00–0.07)
Basophils Absolute: 0 10*3/uL (ref 0.0–0.1)
Basophils Relative: 0 %
Eosinophils Absolute: 0.1 10*3/uL (ref 0.0–0.5)
Eosinophils Relative: 2 %
HCT: 36.2 % — ABNORMAL LOW (ref 39.0–52.0)
Hemoglobin: 11.1 g/dL — ABNORMAL LOW (ref 13.0–17.0)
Immature Granulocytes: 0 %
Lymphocytes Relative: 23 %
Lymphs Abs: 1.3 10*3/uL (ref 0.7–4.0)
MCH: 33.4 pg (ref 26.0–34.0)
MCHC: 30.7 g/dL (ref 30.0–36.0)
MCV: 109 fL — ABNORMAL HIGH (ref 80.0–100.0)
Monocytes Absolute: 0.5 10*3/uL (ref 0.1–1.0)
Monocytes Relative: 9 %
Neutro Abs: 3.7 10*3/uL (ref 1.7–7.7)
Neutrophils Relative %: 66 %
Platelets: 128 10*3/uL — ABNORMAL LOW (ref 150–400)
RBC: 3.32 MIL/uL — ABNORMAL LOW (ref 4.22–5.81)
RDW: 13.4 % (ref 11.5–15.5)
WBC: 5.6 10*3/uL (ref 4.0–10.5)
nRBC: 0 % (ref 0.0–0.2)

## 2019-07-13 LAB — PROTIME-INR
INR: 1.8 — ABNORMAL HIGH (ref 0.8–1.2)
Prothrombin Time: 20.7 seconds — ABNORMAL HIGH (ref 11.4–15.2)

## 2019-07-13 MED ORDER — PREDNISONE 5 MG PO TABS
5.0000 mg | ORAL_TABLET | Freq: Every day | ORAL | Status: DC
  Administered 2019-07-13 – 2019-07-18 (×5): 5 mg via ORAL
  Filled 2019-07-13 (×6): qty 1

## 2019-07-13 MED ORDER — LIDOCAINE-PRILOCAINE 2.5-2.5 % EX CREA
1.0000 "application " | TOPICAL_CREAM | CUTANEOUS | Status: DC | PRN
  Filled 2019-07-13: qty 5

## 2019-07-13 MED ORDER — RENA-VITE PO TABS
1.0000 | ORAL_TABLET | Freq: Every day | ORAL | Status: DC
  Administered 2019-07-13 – 2019-07-18 (×6): 1 via ORAL
  Filled 2019-07-13 (×6): qty 1

## 2019-07-13 MED ORDER — HEPARIN (PORCINE) 25000 UT/250ML-% IV SOLN
1500.0000 [IU]/h | INTRAVENOUS | Status: DC
  Administered 2019-07-13: 1250 [IU]/h via INTRAVENOUS
  Administered 2019-07-14: 1500 [IU]/h via INTRAVENOUS
  Filled 2019-07-13 (×2): qty 250

## 2019-07-13 MED ORDER — PANTOPRAZOLE SODIUM 40 MG PO TBEC
40.0000 mg | DELAYED_RELEASE_TABLET | Freq: Every day | ORAL | Status: DC
  Administered 2019-07-13 – 2019-07-18 (×6): 40 mg via ORAL
  Filled 2019-07-13 (×6): qty 1

## 2019-07-13 MED ORDER — CINACALCET HCL 30 MG PO TABS
30.0000 mg | ORAL_TABLET | ORAL | Status: DC
  Administered 2019-07-13 – 2019-07-17 (×3): 30 mg via ORAL
  Filled 2019-07-13 (×3): qty 1

## 2019-07-13 MED ORDER — CARVEDILOL 6.25 MG PO TABS
6.2500 mg | ORAL_TABLET | Freq: Two times a day (BID) | ORAL | Status: DC
  Administered 2019-07-13 – 2019-07-14 (×2): 6.25 mg via ORAL
  Filled 2019-07-13 (×2): qty 1

## 2019-07-13 MED ORDER — ASPIRIN EC 81 MG PO TBEC
81.0000 mg | DELAYED_RELEASE_TABLET | Freq: Every day | ORAL | Status: DC
  Administered 2019-07-13 – 2019-07-18 (×5): 81 mg via ORAL
  Filled 2019-07-13 (×6): qty 1

## 2019-07-13 MED ORDER — ALLOPURINOL 300 MG PO TABS
300.0000 mg | ORAL_TABLET | Freq: Every day | ORAL | Status: DC
  Administered 2019-07-13 – 2019-07-18 (×6): 300 mg via ORAL
  Filled 2019-07-13 (×6): qty 1

## 2019-07-13 MED ORDER — NITROGLYCERIN 0.4 MG SL SUBL
0.4000 mg | SUBLINGUAL_TABLET | SUBLINGUAL | Status: DC | PRN
  Administered 2019-07-14: 0.4 mg via SUBLINGUAL
  Filled 2019-07-13: qty 1

## 2019-07-13 MED ORDER — SEVELAMER CARBONATE 800 MG PO TABS
4000.0000 mg | ORAL_TABLET | Freq: Three times a day (TID) | ORAL | Status: DC
  Administered 2019-07-14 – 2019-07-17 (×8): 4000 mg via ORAL
  Filled 2019-07-13 (×9): qty 5

## 2019-07-13 MED ORDER — LORATADINE 10 MG PO TABS
10.0000 mg | ORAL_TABLET | Freq: Every day | ORAL | Status: DC
  Administered 2019-07-13 – 2019-07-18 (×6): 10 mg via ORAL
  Filled 2019-07-13 (×6): qty 1

## 2019-07-13 MED ORDER — ACETAMINOPHEN 500 MG PO TABS: 1000.0000 mg | ORAL_TABLET | Freq: Two times a day (BID) | ORAL | Status: DC | PRN

## 2019-07-13 MED ORDER — EZETIMIBE 10 MG PO TABS
10.0000 mg | ORAL_TABLET | Freq: Every day | ORAL | Status: DC
  Administered 2019-07-13 – 2019-07-18 (×6): 10 mg via ORAL
  Filled 2019-07-13 (×6): qty 1

## 2019-07-13 MED ORDER — ONDANSETRON HCL 4 MG/2ML IJ SOLN: 4.0000 mg | Freq: Four times a day (QID) | INTRAMUSCULAR | Status: DC | PRN

## 2019-07-13 MED ORDER — PRAVASTATIN SODIUM 10 MG PO TABS
20.0000 mg | ORAL_TABLET | Freq: Every evening | ORAL | Status: DC
  Administered 2019-07-13: 20 mg via ORAL
  Filled 2019-07-13: qty 2

## 2019-07-13 NOTE — Telephone Encounter (Signed)
Incoming call received from the patient. Patient sts that he is currently at the dialysis center. Today would be the first day that he would be qualified to receive his second COVID vaccine dose. The nurse at the dialysis center wants to know if it would be ok for them to go ahead and administer. The patient is scheduled for a Cardiac Cath on 07/15/19 with Dr. [name] and he is to be admitted to [facility] this afternoon 07/13/19 for hydration in anticipation for the cath.  Advised the patient that I would advise he delay the second COVID vaccine until after his procedure. Adv him that he can talk with Dr. [name] more about it on 07/15/19.  Patient sts that he has been contacted by [facility] bed placement and  has been given a room assignment. He will report to [facility] this afternoon after his dialysis treatment.

## 2019-07-13 NOTE — Progress Notes (Signed)
ANTICOAGULATION CONSULT NOTE - Follow Up Consult  Pharmacy Consult for Heparin Indication: atrial fibrillation and mechanical AVR  Allergies  Allergen Reactions  . Statins     Other reaction(s): Arthralgias (intolerance), Myalgias (intolerance)  Pt taking pravastatin   . Sertraline Rash    Patient Measurements: Height: 5'10" Weight 85.3 kg IBW/kg 73 kg Heparin Dosing Weight: 85.3 kg  Vital Signs: BP: 104/58 (04/07 1755)  Labs: Recent Labs    07/11/19 0000  INR 2.1    CrCl cannot be calculated (Patient's most recent lab result is older than the maximum 21 days allowed.).   Assessment: 64 year old with history of atrial fibrillation and mechanica aortic valve replacement who is being admitted with plans for cardiac cath on Friday 4/9. Pharmacy consulted to dose IV Heparin on admission. Patient was on chronic anticoagulation prior to admission with Warfarin but his last dose was on Monday night between 2230 and 2300 PM. He did not receive any Lovenox injections. Per patient that was the original plan but due to dialysis, decision was changed for admission and IV Heparin. INR on 4/5 was 2.1 (below goal 2.5 to 3.5).   Repeat INR today is 1.8. Hgb 11.1, Plts low at 128.   Goal of Therapy:  Heparin level 0.3-0.7 units/ml Monitor platelets by anticoagulation protocol: Yes   Plan:  Start IV Heparin at 1250 units/hr (~14 units/kg/hr). Heparin level in 8 hours.  Daily Heparin level and CBC while on therapy.   Sloan Leiter, PharmD, BCPS, BCCCP Clinical Pharmacist Please refer to [facility] for [place] numbers 07/13/2019,6:22 PM

## 2019-07-13 NOTE — Progress Notes (Signed)
ANTICOAGULATION CONSULT NOTE - Initial Consult  Pharmacy Consult for heparin Indication: afib and mechanical AVR  Allergies  Allergen Reactions  . Statins     Other reaction(s): Arthralgias (intolerance), Myalgias (intolerance)  Pt taking pravastatin   . Sertraline Rash    Patient Measurements:   Heparin Dosing Weight: 85kg  Vital Signs:    Labs: Recent Labs    07/11/19 0000  INR 2.1    CrCl cannot be calculated (Patient's most recent lab result is older than the maximum 21 days allowed.).   Medical History: Past Medical History:  Diagnosis Date  . Abnormal stress test 03/19/2018  . 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 Aortic stenosis    a. 2014 s/p mech AVR ([facility])-->chronic coumadin; b. 08/2018 Echo: Mild AS/AI. Nl fxn/gradient.  . Arthritis   . Atrial fibrillation ([place])   . CAD (coronary artery disease)    a. 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 02/2018 MV [facility]): large inf, infsept, inflat infarct w/ min rev, EF 30%; c. 06/2018 Cath: LM min irregs, LAD 60p, 95/30/74m, LCX min irregs, RCA 40p, 69m, 90d, RPAV 90, RPDA 95ost; c. 09/2018 CABGx3 (LIMA->LAD, VG->D1, VG->PDA).  . Chronic anticoagulation 06/2012   a. Coumadin in the setting of mech AVR and afib.  Marland Kitchen COVID-19 03/15/2019  . CPAP (continuous positive airway pressure) dependence   . Dialysis patient [facility]) 04/07/2006   Patient started HD around 1996,  had 1st transplant LLQ around 1998 until 2007 when they found renal cell cancer in transplant and both native kidneys, all were removed > went back on HD until 2nd transplant RLQ in 2014 which lasted 3 yrs; it was embolized for suspected acute rejection. Is currently on HD MWF in [place] w/ [facility] nephrology.  . ESRD (end stage renal disease) ([place])    a. 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 1999 s/p Kidney transplant; c. 2014 Bilat nephrectomy (transplanted kidney resected) in the setting  of renal cell carcinoma; d. PD until 11/2017-->HD.  Marland Kitchen Heart murmur   . HFrEF (heart failure with reduced ejection fraction) ([place])    a. 07/2016 Echo: EF 55-60%; b. 02/2018 MV: EF 30%; c. 04/2018 Echo: EF 35%; d. 08/2018 Echo: EF 30-35%. Glob HK w/o rwma. Mildly reduced RV fxn. Mod to sev dil LA. Nl AoV fxn.  Marland Kitchen Hx of colonoscopy    2009 by Dr. Laural Golden. Normal except for small submucosal lipoma. No evidence of polyps or colitis.  . Hyperlipidemia   . Hypertension   . Hypogonadism in male 07/25/2015  . Ischemic cardiomyopathy    a. 07/2016 Echo: EF 55-60%; b. 02/2018 MV: EF 30%; c. 04/2018 Echo: EF 35%; d. 08/2018 Echo: EF 30-35%.  . OSA (obstructive sleep apnea)    No CPAP  . Permanent atrial fibrillation ([facility])    a. CHA2DS2VASc = 3-->Coumadin (also mech AVR); b. 06/2018 Amio started 2/2 elevated rates.  . Renal cell cancer ([place])   . Renal cell carcinoma ([place]) 08/04/2016    Medications:  Warfarin 1mg  daily  Assessment: 64 year old male with history of afib and mechanical aortic valve replacement. Patient being admitted for cath on Friday 4/9.   Reviewing chart; last INR from home POCT was 2.1 on 4/5 and patient was instructed to take warfarin that night and then hold further dosing for cath. It appears that he was possibly given an rx for lovenox to take daily  at bedtime, will need to verify this with patient once admitted. Will plan on starting heparin 18-20 hours after last lovenox shot if taking lovenox or if/when INR<2.5.   Patient was recently therapeutic on IV heparin at 1250 units/hr.   Goal of Therapy:  INR goal 2.5-3.5 Monitor platelets by anticoagulation protocol: Yes   Plan:  Start IV heparin when appropriate Cath 4/9  Erin Hearing PharmD., BCPS Clinical Pharmacist 07/13/2019 3:30 PM

## 2019-07-13 NOTE — Telephone Encounter (Signed)
Ok thanks 

## 2019-07-13 NOTE — H&P (Signed)
Cardiology Admission History and Physical:   Patient ID: DIXIE JAFRI MRN: [id number]; DOB: Mar 05, 1956   Admission date: 07/13/2019  Primary Care Provider: Leone Haven, MD Primary Cardiologist: Kathlyn Sacramento, MD  Primary Electrophysiologist:  None   Chief Complaint:  Elective admission pre-cath  Patient Profile:   Jeremy [name] is a 64 y.o. 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   History of Present Illness:   Jeremy [name] has a hx of ESRD on HD, CAD s/p CABG 09/2018, HLD, PAD/PVD, HFrEF/ICM. Cath 06/2018 with severe two-vessel disease to LAD/RCA. Recommended for CABG. Underwent CABG x4 09/2018 (LIMA to LAD, SVG to D1, SVG to PDA). Echo 05/2019 LVEF 40-45%. Metoprolol switched to Coreg due to reduced EF for optimization of GDMT. Office visit 06-24-19 reporting chest pain relieved by nitroglycerin, subsequent Lexiscan Myoview 07/07/19 was high risk due to significant scar and estimated EF 27%. Full report below. Recommendation to proceed with LHC due to concern for diagonal and rPDA graft patency. Given mechanical AV, he needs heparin bridging prior to and following the procedure.  Past Medical History:  Diagnosis Date  . Abnormal stress test 03/19/2018  . 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 Aortic stenosis    a. 2014 s/p mech AVR ([facility])-->chronic coumadin; b. 08/2018 Echo: Mild AS/AI. Nl fxn/gradient.  . Arthritis   . Atrial fibrillation ([place])   . CAD (coronary artery disease)    a. 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 02/2018 MV [facility]): large inf, infsept, inflat  infarct w/ min rev, EF 30%; c. 06/2018 Cath: LM min irregs, LAD 60p, 95/30/61m, LCX min irregs, RCA 40p, 51m, 90d, RPAV 90, RPDA 95ost; c. 09/2018 CABGx3 (LIMA->LAD, VG->D1, VG->PDA).  . Chronic anticoagulation 06/2012   a. Coumadin in the setting of mech AVR and afib.  Marland Kitchen COVID-19 03/15/2019  . CPAP (continuous positive airway pressure) dependence   . Dialysis patient [facility]) 04/07/2006   Patient started HD around 1996,  had 1st transplant LLQ around 1998 until 2007 when they found renal cell cancer in transplant and both native kidneys, all were removed > went back on HD until 2nd transplant RLQ in 2014 which lasted 3 yrs; it was embolized for suspected acute rejection. Is currently on HD MWF in [place] w/ [facility] nephrology.  . ESRD (end stage renal disease) ([place])    a. 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 1999 s/p Kidney transplant; c. 2014 Bilat nephrectomy (transplanted kidney resected) in the setting of renal cell carcinoma; d. PD until 11/2017-->HD.  Marland Kitchen Heart murmur   . HFrEF (heart failure with reduced ejection fraction) ([place])    a. 07/2016 Echo: EF 55-60%; b. 02/2018 MV: EF 30%; c. 04/2018 Echo: EF 35%; d. 08/2018 Echo: EF 30-35%. Glob HK w/o rwma. Mildly reduced RV fxn. Mod to sev dil LA. Nl AoV fxn.  Marland Kitchen Hx of colonoscopy    2009 by Dr. Laural Golden. Normal except for small submucosal lipoma. No evidence of polyps or colitis.  . Hyperlipidemia   . Hypertension   . Hypogonadism in male 07/25/2015  . Ischemic cardiomyopathy  a. 07/2016 Echo: EF 55-60%; b. 02/2018 MV: EF 30%; c. 04/2018 Echo: EF 35%; d. 08/2018 Echo: EF 30-35%.  . OSA (obstructive sleep apnea)    No CPAP  . Permanent atrial fibrillation ([facility])    a. CHA2DS2VASc = 3-->Coumadin (also mech AVR); b. 06/2018 Amio started 2/2 elevated rates.  . Renal cell cancer ([place])   . Renal cell carcinoma ([place]) 08/04/2016    Past Surgical History:  Procedure Laterality Date  . AORTIC VALVE REPLACEMENT  3 /11 /2014   At [place]    . CAPD INSERTION N/A 02/19/2017   Procedure: LAPAROSCOPIC INSERTION CONTINUOUS AMBULATORY PERITONEAL DIALYSIS  (CAPD) CATHETER;  Surgeon: Algernon Huxley, MD;  Location: [facility] ORS;  Service: General;  Laterality: N/A;  . CORONARY ANGIOPLASTY WITH STENT PLACEMENT    . CORONARY ARTERY BYPASS GRAFT N/A 09/16/2018   Procedure: CORONARY ARTERY BYPASS GRAFT times three      with left internal mammary artery and endoharvest of right leg greater saphenous vein;  Surgeon: Melrose Nakayama, MD;  Location: [place];  Service: Open Heart Surgery;  Laterality: N/A;  . DG AV DIALYSIS  SHUNT ACCESS EXIST*L* OR     left arm  . EPIGASTRIC HERNIA REPAIR N/A 02/19/2017   Procedure: HERNIA REPAIR EPIGASTRIC ADULT;  Surgeon: Robert Bellow, MD;  Location: [facility] ORS;  Service: General;  Laterality: N/A;  . EYE SURGERY     bilateral cataract  . HERNIA REPAIR     UMBILICAL HERNIA  . HERNIA REPAIR  02/19/2017  . INNER EAR SURGERY     20 years ago  . KIDNEY TRANSPLANT    . Peritoneal Dialysis access placement  02/19/2017  . REMOVAL OF A DIALYSIS CATHETER N/A 11/23/2017   Procedure: REMOVAL OF A PERITONEAL DIALYSIS CATHETER;  Surgeon: Algernon Huxley, MD;  Location: [facility];  Service: Vascular;  Laterality: N/A;  . RIGHT/LEFT HEART CATH AND CORONARY ANGIOGRAPHY Bilateral 06/07/2018   Procedure: RIGHT/LEFT HEART CATH AND CORONARY ANGIOGRAPHY;  Surgeon: Wellington Hampshire, MD;  Location: [place] CV LAB;  Service: Cardiovascular;  Laterality: Bilateral;  . TEE WITHOUT CARDIOVERSION N/A 07/21/2016   Procedure: TRANSESOPHAGEAL ECHOCARDIOGRAM (TEE);  Surgeon: Wellington Hampshire, MD;  Location: [facility];  Service: Cardiovascular;  Laterality: N/A;  . TEE WITHOUT CARDIOVERSION N/A 09/16/2018   Procedure: TRANSESOPHAGEAL ECHOCARDIOGRAM (TEE);  Surgeon: Melrose Nakayama, MD;  Location: [place];  Service: Open Heart Surgery;  Laterality: N/A;     Medications Prior to Admission: Prior to  Admission medications   Medication Sig Start Date End Date Taking? Authorizing Provider  acetaminophen (TYLENOL) 500 MG tablet Take 1,000 mg 2 (two) times daily as needed by mouth for moderate pain.    [provider]  allopurinol (ZYLOPRIM) 300 MG tablet Take 300 mg by mouth daily. 12/23/17   [provider]  aspirin EC 81 MG EC tablet Take 1 tablet (81 mg total) by mouth daily. 09/23/18   Gold, Wayne E, PA-C  carvedilol (COREG) 6.25 MG tablet TAKE 1 TABLET (6.25 MG TOTAL) BY MOUTH 2 (TWO) TIMES DAILY WITH A MEAL. 06/21/19   Wellington Hampshire, MD  cinacalcet (SENSIPAR) 30 MG tablet Take 30 mg by mouth every other day.     [provider]  ezetimibe (ZETIA) 10 MG tablet Take 1 tablet (10 mg total) by mouth daily. 04/29/19 07/28/19  Rise Mu, PA-C  lidocaine-prilocaine (EMLA) cream Apply 1 application topically as needed (for  fistula).    [provider]  loratadine (CLARITIN) 10 MG tablet Take 10 mg by mouth daily.    [provider]  multivitamin (RENA-VIT) TABS tablet Take 1 tablet by mouth daily. 02/23/19   [provider]  nitroGLYCERIN (NITROSTAT) 0.4 MG SL tablet Place 1 tablet (0.4 mg total) under the tongue every 5 (five) minutes as needed for chest pain. 06/24/19 09/22/19  Loel Dubonnet, NP  omeprazole (PRILOSEC) 20 MG capsule Take 20 mg by mouth daily.    [provider]  pravastatin (PRAVACHOL) 20 MG tablet Take 1 tablet (20 mg total) by mouth every evening. 12/08/18 06/05/28  Wellington Hampshire, MD  predniSONE (DELTASONE) 5 MG tablet Take 5 mg by mouth daily with breakfast.  10/12/16   [provider]  sevelamer carbonate (RENVELA) 800 MG tablet Take 40,000 mg by mouth 3 (three) times daily with meals. 5 tabs per meal    [provider]  warfarin (COUMADIN) 1 MG tablet Take 1-2 tablets (1-2 mg total) by mouth See admin instructions. 1 mg all days , except 0.5 MG on Fridays..* OR AS DIRECTED Patient taking  differently: Take 1 mg by mouth daily.  04/04/19   Leone Haven, MD     Allergies:    Allergies  Allergen Reactions  . Statins     Other reaction(s): Arthralgias (intolerance), Myalgias (intolerance)  Pt taking pravastatin   . Sertraline Rash    Social History:   Social History   Socioeconomic History  . Marital status: Significant Other    Spouse name: Not on file  . Number of children: Not on file  . Years of education: Not on file  . Highest education level: High school graduate  Occupational History  . 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 Smoking status: Never Smoker  . Smokeless tobacco: Never Used  Substance and Sexual Activity  . Alcohol use: No  . Drug use: No  . Sexual activity: Not Currently    Partners: Female  Other Topics Concern  . Not on file  Social History Narrative   Single   1 daughter 16    Social Determinants of Health   Financial Resource Strain:   . Difficulty of Paying Living Expenses:   Food Insecurity:   . Worried About Charity fundraiser in the Last Year:   . Arboriculturist in the Last Year:   Transportation Needs:   . Film/video editor (Medical):   Marland Kitchen Lack of Transportation (Non-Medical):   Physical Activity:   . Days of Exercise per Week:   . Minutes of Exercise per Session:   Stress:   . Feeling of Stress :   Social Connections:   . Frequency of Communication with Friends and Family:   . Frequency of Social Gatherings with Friends and Family:   . Attends Religious Services:   . Active Member of Clubs or Organizations:   . Attends Archivist Meetings:   Marland Kitchen Marital Status:   Intimate Partner Violence:   . Fear of Current or Ex-Partner:   . Emotionally Abused:   Marland Kitchen Physically Abused:   . Sexually Abused:     Family History:   The patient's family history includes Cancer in his mother; Diabetes in his father; Heart disease in his father.    ROS:  Please see the history of  present illness.  All other ROS reviewed and negative.     Physical  Exam/Data:   Vitals:   07/13/19 1755 07/13/19 1825 07/13/19 2044  BP: (!) 104/58  (!) 99/58  Pulse:   99  Resp:   16  Temp:  99 F (37.2 C) 98.3 F (36.8 C)  TempSrc:   Oral  SpO2:  100% 99%   No intake or output data in the 24 hours ending 07/13/19 2057 Last 3 Weights 07/05/2019 06/24/2019 06/07/2019  Weight (lbs) 188 lb 189 lb 191 lb  Weight (kg) 85.276 kg 85.73 kg 86.637 kg     There is no height or weight on file to calculate BMI.  General:  Well nourished, well developed, in no acute distress HEENT: normal Lymph: no adenopathy Neck: no JVD Endocrine:  No thryomegaly Vascular: No carotid bruits; DP pulses 2+ bilaterally Cardiac:  normal S1, mechanical S2; RRR; no murmur  Lungs:  clear to auscultation bilaterally, no wheezing, rhonchi or rales  Abd: soft, nontender, no hepatomegaly  Ext: no edema Musculoskeletal:  No deformities, BUE and BLE strength normal and equal Skin: warm and dry  Neuro:  CNs 2-12 intact, no focal abnormalities noted Psych:  Normal affect    EKG:  The ECG that was done 07-13-19 was personally reviewed and demonstrates afib w/ HR 98, RBBB  Relevant CV Studies:  TTE 05-24-19 1. Left ventricular ejection fraction, by estimation, is 40 to 45%. The  left ventricle has moderately decreased function. The left ventricle  demonstrates global hypokinesis. There is mild left ventricular  hypertrophy. Left ventricular diastolic  parameters are indeterminate.  2. Right ventricular systolic function is moderately reduced. The right  ventricular size is normal. mildly increased right ventricular wall  thickness. Tricuspid regurgitation signal is inadequate for assessing PA  pressure.  3. Left atrial size was severely dilated.  4. Right atrial size was moderately dilated.  5. The mitral valve is degenerative. No evidence of mitral valve  regurgitation. No evidence of mitral stenosis.    6. The aortic valve has been repaired/replaced. Aortic valve  regurgitation is mild. There is no significant aortic valve stenosis.  There is a 21 mm St. Jude mechanical valve present in the aortic position.  Procedure Date: 2014. Echo findings are  consistent with regurgitation of the aortic prosthesis. Aortic valve mean  gradient measures 5.0 mmHg.  7. The inferior vena cava is normal in size with greater than 50%  respiratory variability, suggesting right atrial pressure of 3 mmHg.   Lexiscan [place] 07/07/19 Pharmacological myocardial perfusion imaging study with large region of fixed defect in the inferior, inferolateral wall consistent with prior infarct Moderate-sized region of ischemia noted in the mid anterior wall, mid lateral wall extending to apical lateral region Hypokinesis of the inferior and apical region EF estimated at 27% No EKG changes concerning for ischemia at peak stress or in recovery. Resting EKG with atrial fibrillation, right bundle branch block High risk scan, given large region of scar, severely depressed ejection fraction  06-07-18 LHC  Prox LAD lesion is 60% stenosed.  Mid LAD-2 lesion is 30% stenosed.  Mid LAD-1 lesion is 95% stenosed.  Mid LAD to Dist LAD lesion is 60% stenosed.  Prox RCA lesion is 40% stenosed.  Mid RCA lesion is 70% stenosed.  Dist RCA lesion is 90% stenosed with 90% stenosed side branch in Post Atrio.  Ost RPDA to RPDA lesion is 95% stenosed.   1.  Severe heavily calcified two-vessel coronary artery disease affecting the LAD and right coronary artery.  The disease is not optimal for atherectomy  given significant tortuosity in the proximal LAD segment and diffuse disease affecting the right coronary artery extending into the bifurcation. 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 2.  Right heart catheterization showed mildly elevated filling pressures, moderate  pulmonary hypertension and normal cardiac output.  Laboratory Data:  High Sensitivity Troponin:  No results for input(s): TROPONINIHS in the last 720 hours.    ChemistryNo results for input(s): NA, K, CL, CO2, GLUCOSE, BUN, CREATININE, CALCIUM, GFRNONAA, GFRAA, ANIONGAP in the last 168 hours.  No results for input(s): PROT, ALBUMIN, AST, ALT, ALKPHOS, BILITOT in the last 168 hours. Hematology Recent Labs  Lab 07/13/19 1849  WBC 5.6  RBC 3.32*  HGB 11.1*  HCT 36.2*  MCV 109.0*  MCH 33.4  MCHC 30.7  RDW 13.4  PLT 128*   BNPNo results for input(s): BNP, PROBNP in the last 168 hours.  DDimer No results for input(s): DDIMER in the last 168 hours.   Radiology/Studies:  No results found.       Assessment and Plan:   1. CAD: h/o CABG, recent lexiscan myoview abnormal and pt scheduled for LHC for further evaluation on Friday. He is being admitted for heparin bridging due to mechanical AV. Will hold coumadin, cont IV heparin and follow INR's. 2. AF: permanent/chronic. Cont home regimen 3. AS: s/p mechanical AVR; needs heparin bridging as above 4. ESRD: on hemodialysis; needs nephrology to follow to set up inpatient dialysis while he is admitted 5. HFrEF: clinically compensated; cont home regimen   For questions or updates, please contact [place] Please consult [email or web address] for contact info under     Signed, Rudean Curt, MD, [facility]  07/13/2019 8:57 PM

## 2019-07-14 DIAGNOSIS — I200 Unstable angina: Secondary | ICD-10-CM

## 2019-07-14 LAB — BASIC METABOLIC PANEL
Anion gap: 14 (ref 5–15)
BUN: 26 mg/dL — ABNORMAL HIGH (ref 8–23)
CO2: 30 mmol/L (ref 22–32)
Calcium: 8.7 mg/dL — ABNORMAL LOW (ref 8.9–10.3)
Chloride: 95 mmol/L — ABNORMAL LOW (ref 98–111)
Creatinine, Ser: 5.8 mg/dL — ABNORMAL HIGH (ref 0.61–1.24)
GFR calc Af Amer: 11 mL/min — ABNORMAL LOW (ref >60)
GFR calc non Af Amer: 10 mL/min — ABNORMAL LOW (ref >60)
Glucose, Bld: 148 mg/dL — ABNORMAL HIGH (ref 70–99)
Potassium: 4.7 mmol/L (ref 3.5–5.1)
Sodium: 139 mmol/L (ref 135–145)

## 2019-07-14 LAB — CBC
HCT: 32.3 % — ABNORMAL LOW (ref 39.0–52.0)
Hemoglobin: 10.1 g/dL — ABNORMAL LOW (ref 13.0–17.0)
MCH: 33.7 pg (ref 26.0–34.0)
MCHC: 31.3 g/dL (ref 30.0–36.0)
MCV: 107.7 fL — ABNORMAL HIGH (ref 80.0–100.0)
Platelets: 111 10*3/uL — ABNORMAL LOW (ref 150–400)
RBC: 3 MIL/uL — ABNORMAL LOW (ref 4.22–5.81)
RDW: 13.4 % (ref 11.5–15.5)
WBC: 5.6 10*3/uL (ref 4.0–10.5)
nRBC: 0.4 % — ABNORMAL HIGH (ref 0.0–0.2)

## 2019-07-14 LAB — PROTIME-INR
INR: 1.8 — ABNORMAL HIGH (ref 0.8–1.2)
Prothrombin Time: 21.2 seconds — ABNORMAL HIGH (ref 11.4–15.2)

## 2019-07-14 LAB — HEPARIN LEVEL (UNFRACTIONATED)
Heparin Unfractionated: 0.13 IU/mL — ABNORMAL LOW (ref 0.30–0.70)
Heparin Unfractionated: 0.26 IU/mL — ABNORMAL LOW (ref 0.30–0.70)
Heparin Unfractionated: 0.25 IU/mL — ABNORMAL LOW (ref 0.30–0.70)
Heparin Unfractionated: 0.5 IU/mL (ref 0.30–0.70)

## 2019-07-14 MED ORDER — CARVEDILOL 12.5 MG PO TABS
12.5000 mg | ORAL_TABLET | Freq: Two times a day (BID) | ORAL | Status: DC
  Administered 2019-07-14: 12.5 mg via ORAL
  Filled 2019-07-14: qty 1

## 2019-07-14 MED ORDER — SODIUM CHLORIDE 0.9 % IV SOLN: INTRAVENOUS | Status: DC

## 2019-07-14 MED ORDER — ROSUVASTATIN CALCIUM 20 MG PO TABS
40.0000 mg | ORAL_TABLET | Freq: Every day | ORAL | Status: DC
  Administered 2019-07-14 – 2019-07-17 (×4): 40 mg via ORAL
  Filled 2019-07-14 (×4): qty 2

## 2019-07-14 MED ORDER — SODIUM CHLORIDE 0.9% FLUSH: 3.0000 mL | Freq: Two times a day (BID) | INTRAVENOUS | Status: DC

## 2019-07-14 MED ORDER — CHLORHEXIDINE GLUCONATE CLOTH 2 % EX PADS
6.0000 | MEDICATED_PAD | Freq: Every day | CUTANEOUS | Status: DC
  Administered 2019-07-16: 6 via TOPICAL

## 2019-07-14 MED ORDER — ASPIRIN 81 MG PO CHEW
81.0000 mg | CHEWABLE_TABLET | ORAL | Status: AC
  Administered 2019-07-15: 81 mg via ORAL
  Filled 2019-07-14: qty 1

## 2019-07-14 MED ORDER — SODIUM CHLORIDE 0.9 % IV SOLN: 250.0000 mL | INTRAVENOUS | Status: DC | PRN

## 2019-07-14 MED ORDER — SODIUM CHLORIDE 0.9% FLUSH: 3.0000 mL | INTRAVENOUS | Status: DC | PRN

## 2019-07-14 NOTE — H&P (View-Only) (Signed)
Progress Note  Patient Name: Jeremy [name] Date of Encounter: 07/14/2019  Primary Cardiologist: Kathlyn Sacramento, MD   Subjective   Doing well this morning. At the sink brushing his teeth.   Inpatient Medications    Scheduled Meds: . allopurinol  300 mg Oral Daily  . aspirin EC  81 mg Oral Daily  . carvedilol  12.5 mg Oral BID WC  . cinacalcet  30 mg Oral QODAY  . ezetimibe  10 mg Oral Daily  . loratadine  10 mg Oral Daily  . multivitamin  1 tablet Oral Daily  . pantoprazole  40 mg Oral Daily  . predniSONE  5 mg Oral Q breakfast  . rosuvastatin  40 mg Oral q1800  . sevelamer carbonate  4,000 mg Oral TID WC   Continuous Infusions: . heparin 1,350 Units/hr (07/14/19 0326)   PRN Meds: acetaminophen, lidocaine-prilocaine, nitroGLYCERIN, ondansetron (ZOFRAN) IV   Vital Signs    Vitals:   07/13/19 1755 07/13/19 1825 07/13/19 2044 07/14/19 0533  BP: (!) 104/58  (!) 99/58 117/76  Pulse:   99 92  Resp:   16 18  Temp:  99 F (37.2 C) 98.3 F (36.8 C) 98.1 F (36.7 C)  TempSrc:   Oral Oral  SpO2:  100% 99% 100%  Weight:    85 kg    Intake/Output Summary (Last 24 hours) at 07/14/2019 0950 Last data filed at 07/13/2019 2300 Gross per 24 hour  Intake 240 ml  Output --  Net 240 ml   Last 3 Weights 07/14/2019 07/05/2019 06/24/2019  Weight (lbs) 187 lb 6.4 oz 188 lb 189 lb  Weight (kg) 85.004 kg 85.276 kg 85.73 kg      Telemetry    Afib rates 90-100 - Personally Reviewed  ECG    No new tracing  Physical Exam  Pleasant older WM GEN: No acute distress.   Neck: No JVD Cardiac: Irreg Irreg tachy,  + mechanical valve click s2, no rubs, or gallops.  Respiratory: Clear to auscultation bilaterally. GI: Soft, nontender, non-distended  MS: No edema; No deformity. LUE AA fistula Neuro:  Nonfocal  Psych: Normal affect   Labs    High Sensitivity Troponin:  No results for input(s): TROPONINIHS in the last 720 hours.    Chemistry Recent Labs  Lab 07/14/19 0222  NA 139  K  4.7  CL 95*  CO2 30  GLUCOSE 148*  BUN 26*  CREATININE 5.80*  CALCIUM 8.7*  GFRNONAA 10*  GFRAA 11*  ANIONGAP 14     Hematology Recent Labs  Lab 07/13/19 1849 07/14/19 0222  WBC 5.6 5.6  RBC 3.32* 3.00*  HGB 11.1* 10.1*  HCT 36.2* 32.3*  MCV 109.0* 107.7*  MCH 33.4 33.7  MCHC 30.7 31.3  RDW 13.4 13.4  PLT 128* 111*    BNPNo results for input(s): BNP, PROBNP in the last 168 hours.   DDimer No results for input(s): DDIMER in the last 168 hours.   Radiology    No results found.  Cardiac Studies   N/a   Patient Profile     64 y.o. 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  Assessment & Plan    1.  CAD s/p CABG: reports after having COVID back in Dec he has been having shortness of breath and chest tightness very similar to his anginal pain. Had a stress test in the office which was abnormal, now planned for cardiac cath. On IV heparin bridge given hx of mechanical AVR -- NPO at midnight -- The patient understands that risks included but are not limited to stroke (1 in 1000), death (1 in 50), kidney failure [usually temporary] (1 in 500), bleeding (1 in 200), allergic reaction [possibly serious] (1 in 200).   2. Permanent Afib: reports he had been on amiodarone, but stopped about 2 months ago by Dr. [name]. Rates are in the 100s this morning, will further increase coreg from 6.25>>12.5mg  BID.  -- IV heparin with coumadin held  3. Mechanical AVR: on IV heparin as above, INR 1.8 today.   4. HL: on pravastatin prior to admission. Says he has been on Lipitor and Crestor in the past, but thought this causes his joint pain.  Has arthritis, says he does feel the pravastatin has much different. He is ok with trying Crestor again -- switch to Crestor 40mg  daily  5. ESRD on HD: followed through [facility], does treatment in [place]. MWF schedule, had session yesterday. Labs stable this morning.  -- have consulted nephrology for HD needs while inpatient   6. Chronic systolic HF: volume stable on exam, HD for volume management  For questions or updates, please contact [place] Please consult [email or web address] for contact info under   Patient seen, examined. Available data reviewed. Agree with findings, assessment, and plan as outlined by Reino Bellis, NP.  On my exam he is alert and oriented, in no distress.  JVP is normal, lungs are clear, heart is irregularly irregular with a 2/6 ejection murmur at the right upper sternal border, mechanical A2 is present.  Abdomen is soft and nontender, extremities have no edema, skin is warm and dry without rash.  The patient is admitted for heparin bridging in the setting of a mechanical aortic valve and permanent atrial fibrillation.  He will undergo cardiac catheterization tomorrow.  I talked with him at length about the catheterization procedure as well as PCI.  He asked several excellent questions about our thresholds to do angioplasty and stenting.  He will be continued on his current medical therapy with an increase in carvedilol to try to better control heart rate.  His resting heart rate is about 110 bpm with permanent atrial fibrillation.  Might need to consider changing him from carvedilol to metoprolol succinate as he requires midodrine with dialysis.  Another consideration would be amiodarone for rate control. Will review with Dr. [name].  Sherren Mocha, M.D. 07/14/2019 10:17 AM    Signed, Reino Bellis, NP  07/14/2019, 9:50 AM

## 2019-07-14 NOTE — Progress Notes (Signed)
Patient states he wars C-pap at night when he's home but has never wore it in the hospital. Patient refusing to wear C-pap during this admission.

## 2019-07-14 NOTE — Progress Notes (Addendum)
Progress Note  Patient Name: Jeremy [name] Date of Encounter: 07/14/2019  Primary Cardiologist: Kathlyn Sacramento, MD   Subjective   Doing well this morning. At the sink brushing his teeth.   Inpatient Medications    Scheduled Meds: . allopurinol  300 mg Oral Daily  . aspirin EC  81 mg Oral Daily  . carvedilol  12.5 mg Oral BID WC  . cinacalcet  30 mg Oral QODAY  . ezetimibe  10 mg Oral Daily  . loratadine  10 mg Oral Daily  . multivitamin  1 tablet Oral Daily  . pantoprazole  40 mg Oral Daily  . predniSONE  5 mg Oral Q breakfast  . rosuvastatin  40 mg Oral q1800  . sevelamer carbonate  4,000 mg Oral TID WC   Continuous Infusions: . heparin 1,350 Units/hr (07/14/19 0326)   PRN Meds: acetaminophen, lidocaine-prilocaine, nitroGLYCERIN, ondansetron (ZOFRAN) IV   Vital Signs    Vitals:   07/13/19 1755 07/13/19 1825 07/13/19 2044 07/14/19 0533  BP: (!) 104/58  (!) 99/58 117/76  Pulse:   99 92  Resp:   16 18  Temp:  99 F (37.2 C) 98.3 F (36.8 C) 98.1 F (36.7 C)  TempSrc:   Oral Oral  SpO2:  100% 99% 100%  Weight:    85 kg    Intake/Output Summary (Last 24 hours) at 07/14/2019 0950 Last data filed at 07/13/2019 2300 Gross per 24 hour  Intake 240 ml  Output --  Net 240 ml   Last 3 Weights 07/14/2019 07/05/2019 06/24/2019  Weight (lbs) 187 lb 6.4 oz 188 lb 189 lb  Weight (kg) 85.004 kg 85.276 kg 85.73 kg      Telemetry    Afib rates 90-100 - Personally Reviewed  ECG    No new tracing  Physical Exam  Pleasant older WM GEN: No acute distress.   Neck: No JVD Cardiac: Irreg Irreg tachy,  + mechanical valve click s2, no rubs, or gallops.  Respiratory: Clear to auscultation bilaterally. GI: Soft, nontender, non-distended  MS: No edema; No deformity. LUE AA fistula Neuro:  Nonfocal  Psych: Normal affect   Labs    High Sensitivity Troponin:  No results for input(s): TROPONINIHS in the last 720 hours.    Chemistry Recent Labs  Lab 07/14/19 0222  NA 139  K  4.7  CL 95*  CO2 30  GLUCOSE 148*  BUN 26*  CREATININE 5.80*  CALCIUM 8.7*  GFRNONAA 10*  GFRAA 11*  ANIONGAP 14     Hematology Recent Labs  Lab 07/13/19 1849 07/14/19 0222  WBC 5.6 5.6  RBC 3.32* 3.00*  HGB 11.1* 10.1*  HCT 36.2* 32.3*  MCV 109.0* 107.7*  MCH 33.4 33.7  MCHC 30.7 31.3  RDW 13.4 13.4  PLT 128* 111*    BNPNo results for input(s): BNP, PROBNP in the last 168 hours.   DDimer No results for input(s): DDIMER in the last 168 hours.   Radiology    No results found.  Cardiac Studies   N/a   Patient Profile     64 y.o. 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  Assessment & Plan    1.  CAD s/p CABG: reports after having COVID back in Dec he has been having shortness of breath and chest tightness very similar to his anginal pain. Had a stress test in the office which was abnormal, now planned for cardiac cath. On IV heparin bridge given hx of mechanical AVR -- NPO at midnight -- The patient understands that risks included but are not limited to stroke (1 in 1000), death (1 in 38), kidney failure [usually temporary] (1 in 500), bleeding (1 in 200), allergic reaction [possibly serious] (1 in 200).   2. Permanent Afib: reports he had been on amiodarone, but stopped about 2 months ago by Dr. [name]. Rates are in the 100s this morning, will further increase coreg from 6.25>>12.5mg  BID.  -- IV heparin with coumadin held  3. Mechanical AVR: on IV heparin as above, INR 1.8 today.   4. HL: on pravastatin prior to admission. Says he has been on Lipitor and Crestor in the past, but thought this causes his joint pain.  Has arthritis, says he does feel the pravastatin has much different. He is ok with trying Crestor again -- switch to Crestor 40mg  daily  5. ESRD on HD: followed through [facility], does treatment in [place]. MWF schedule, had session yesterday. Labs stable this morning.  -- have consulted nephrology for HD needs while inpatient   6. Chronic systolic HF: volume stable on exam, HD for volume management  For questions or updates, please contact [place] Please consult [email or web address] for contact info under   Patient seen, examined. Available data reviewed. Agree with findings, assessment, and plan as outlined by Reino Bellis, NP.  On my exam he is alert and oriented, in no distress.  JVP is normal, lungs are clear, heart is irregularly irregular with a 2/6 ejection murmur at the right upper sternal border, mechanical A2 is present.  Abdomen is soft and nontender, extremities have no edema, skin is warm and dry without rash.  The patient is admitted for heparin bridging in the setting of a mechanical aortic valve and permanent atrial fibrillation.  He will undergo cardiac catheterization tomorrow.  I talked with him at length about the catheterization procedure as well as PCI.  He asked several excellent questions about our thresholds to do angioplasty and stenting.  He will be continued on his current medical therapy with an increase in carvedilol to try to better control heart rate.  His resting heart rate is about 110 bpm with permanent atrial fibrillation.  Might need to consider changing him from carvedilol to metoprolol succinate as he requires midodrine with dialysis.  Another consideration would be amiodarone for rate control. Will review with Dr. [name].  Sherren Mocha, M.D. 07/14/2019 10:17 AM    Signed, Reino Bellis, NP  07/14/2019, 9:50 AM

## 2019-07-14 NOTE — Progress Notes (Signed)
ANTICOAGULATION CONSULT NOTE - Initial Consult  Pharmacy Consult for heparin Indication: afib and mechanical AVR  Allergies  Allergen Reactions  . Statins     Other reaction(s): Arthralgias (intolerance), Myalgias (intolerance)  Pt taking pravastatin   . Sertraline Rash    Patient Measurements: Weight: 85 kg (187 lb 6.4 oz) Heparin Dosing Weight: 85kg  Vital Signs: Temp: 98.1 F (36.7 C) (04/08 0533) Temp Source: Oral (04/08 0533) BP: 117/76 (04/08 0533) Pulse Rate: 92 (04/08 0533)  Labs: Recent Labs    07/13/19 1849 07/14/19 0222 07/14/19 0447 07/14/19 1050  HGB 11.1* 10.1*  --   --   HCT 36.2* 32.3*  --   --   PLT 128* 111*  --   --   LABPROT 20.7* 21.2*  --   --   INR 1.8* 1.8*  --   --   HEPARINUNFRC  --  0.13* 0.26* 0.25*  CREATININE  --  5.80*  --   --     Estimated Creatinine Clearance: 13.6 mL/min (A) (by C-G formula based on SCr of 5.8 mg/dL (H)).   Medical History: Past Medical History:  Diagnosis Date  . Abnormal stress test 03/19/2018  . 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 Aortic stenosis    a. 2014 s/p mech AVR ([facility])-->chronic coumadin; b. 08/2018 Echo: Mild AS/AI. Nl fxn/gradient.  . Arthritis   . Atrial fibrillation ([place])   . CAD (coronary artery disease)    a. 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 02/2018 MV [facility]): large inf, infsept, inflat infarct w/ min rev, EF 30%; c. 06/2018 Cath: LM min irregs, LAD 60p, 95/30/62m, LCX min irregs, RCA 40p, 39m, 90d, RPAV 90, RPDA 95ost; c. 09/2018 CABGx3 (LIMA->LAD, VG->D1, VG->PDA).  . Chronic anticoagulation 06/2012   a. Coumadin in the setting of mech AVR and afib.  Marland Kitchen COVID-19 03/15/2019  . CPAP (continuous positive airway pressure) dependence   . Dialysis patient [facility]) 04/07/2006   Patient started HD around 1996,  had 1st transplant LLQ around 1998 until 2007 when they found renal cell cancer in transplant and both native  kidneys, all were removed > went back on HD until 2nd transplant RLQ in 2014 which lasted 3 yrs; it was embolized for suspected acute rejection. Is currently on HD MWF in [place] w/ [facility] nephrology.  . ESRD (end stage renal disease) ([place])    a. 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 1999 s/p Kidney transplant; c. 2014 Bilat nephrectomy (transplanted kidney resected) in the setting of renal cell carcinoma; d. PD until 11/2017-->HD.  Marland Kitchen Heart murmur   . HFrEF (heart failure with reduced ejection fraction) ([place])    a. 07/2016 Echo: EF 55-60%; b. 02/2018 MV: EF 30%; c. 04/2018 Echo: EF 35%; d. 08/2018 Echo: EF 30-35%. Glob HK w/o rwma. Mildly reduced RV fxn. Mod to sev dil LA. Nl AoV fxn.  Marland Kitchen Hx of colonoscopy    2009 by Dr. Laural Golden. Normal except for small submucosal lipoma. No evidence of polyps or colitis.  . Hyperlipidemia   . Hypertension   . Hypogonadism in male 07/25/2015  . Ischemic cardiomyopathy    a. 07/2016 Echo: EF 55-60%; b. 02/2018 MV: EF 30%; c. 04/2018 Echo: EF 35%; d. 08/2018 Echo: EF 30-35%.  . OSA (obstructive sleep apnea)    No CPAP  . Permanent atrial fibrillation ([facility])    a. CHA2DS2VASc = 3-->Coumadin (also mech  AVR); b. 06/2018 Amio started 2/2 elevated rates.  . Renal cell cancer ([place])   . Renal cell carcinoma ([place]) 08/04/2016   Assessment: 64 year old male with history of afib and mechanical aortic valve replacement. Patient being admitted for cath on Friday 4/9.   Reviewing chart; last INR from home POCT was 2.1 on 4/5 and patient was instructed to take warfarin that night and then hold further dosing for cath.  4/8: Heparin level 0.25 (SUB-therapeutic) on heparin gtt at 1350 units/hr. Hbg 10.1 (stable); Plts 111 (stable). No concerns for bleeding or line issues.   Goal of Therapy:  Heparin level 0.3 - 0.5 units/mL  Monitor platelets by anticoagulation protocol: Yes   Plan:  Increase Heparin gtt to 1500 units/hr Obtain 8hr heparin level Monitor daily heparin  level, CBC, and sx/s of bleeding  Planned Cath 4/9; F/u post-cath plan   Acey Lav, PharmD  PGY1 Acute Care Pharmacy Resident 07/14/2019 12:33 PM

## 2019-07-14 NOTE — TOC Initial Note (Signed)
Transition of Care [facility]) - Initial/Assessment Note    Patient Details  Name: Jeremy [name] MRN: [id number] Date of Birth: March 29, 1956  Transition of Care [facility] CM/SW Contact:    Bethena Roys, RN Phone Number: 07/14/2019, 11:26 AM  Clinical Narrative: High risk for readmission assessment completed. Patient presented for unstable angina- plan for cardiac cath on 07-15-19. Prior to arrival patient was independent from home with girlfriend. Plan is to return home once stable. Patient does not use any durable medical equipment in the home. [facility] and he has a primary care provider. Case Manager will continue to follow for any additional medication needs.            Expected Discharge Plan: Home/Self Care Barriers to Discharge: Continued Medical Work up(Cardiac cath planned for 07-15-19)   Patient Goals and CMS Choice     Choice offered to / list presented to : NA  Expected Discharge Plan and Services Expected Discharge Plan: Home/Self Care In-house Referral: NA Discharge Planning Services: CM Consult Post Acute Care Choice: NA Living arrangements for the past 2 months: Single Family Home                 DME Arranged: N/A DME Agency: NA       HH Arranged: NA HH Agency: NA        Prior Living Arrangements/Services Living arrangements for the past 2 months: Single Family Home Lives with:: Significant Other Patient language and need for interpreter reviewed:: Yes Do you feel safe going back to the place where you live?: Yes      Need for Family Participation in Patient Care: No (Comment) Care giver support system in place?: No (comment)   Criminal Activity/Legal Involvement Pertinent to Current Situation/Hospitalization: No - Comment as needed  Activities of Daily Living Home Assistive Devices/Equipment: CPAP ADL Screening (condition at time of admission) Patient's cognitive ability adequate to safely complete daily  activities?: Yes Is the patient deaf or have difficulty hearing?: No Does the patient have difficulty seeing, even when wearing glasses/contacts?: No Does the patient have difficulty concentrating, remembering, or making decisions?: No Patient able to express need for assistance with ADLs?: Yes Does the patient have difficulty dressing or bathing?: No Independently performs ADLs?: Yes (appropriate for developmental age) Does the patient have difficulty walking or climbing stairs?: Yes Weakness of Legs: Both Weakness of Arms/Hands: None  Permission Sought/Granted Permission sought to share information with : Family Supports                Emotional Assessment Appearance:: Appears stated age Attitude/Demeanor/Rapport: Engaged Affect (typically observed): Appropriate Orientation: : Oriented to Situation, Oriented to  Time, Oriented to Place, Oriented to Self Alcohol / Substance Use: Not Applicable Psych Involvement: No (comment)  Admission diagnosis:  Unstable angina ([place]) [I20.0] Patient Active Problem List   Diagnosis Date Noted  . Unstable angina ([place]) 07/13/2019  . Atherosclerosis of native arteries of the extremities with ulceration ([place]) 07/05/2019  . Foot ulcer ([place]) 05/27/2019  . Skin lesion 11/17/2018  . Complication of vascular access for dialysis 10/01/2018  . S/P CABG x 3 09/16/2018  . Hydrocele 08/16/2018  . IBS (irritable bowel syndrome) 03/19/2018  . Depression 05/17/2017  . AV fistula ([place]) 05/17/2017  . Chronic pain of both knees 05/17/2017  . Arthralgia 05/17/2017  . Osteoarthritis 03/17/2017  . Trigger little finger of right hand 03/17/2017  . Liver disease, unspecified 03/17/2017  . Chronic rejection of  kidney transplant 02/11/2017  . Atrial fibrillation ([place]) 10/30/2016  . Ventral hernia 10/14/2016  . History of renal cell carcinoma 08/04/2016  . Renal transplant failure and rejection 07/31/2016  . Hypogonadism in male 07/25/2015  . Anemia in  chronic kidney disease 07/04/2015  . Gout 04/24/2015  . Proteinuria 01/03/2015  . Chronic anticoagulation 11/06/2014  . Iron deficiency anemia 11/02/2014  . Epigastric hernia 02/08/2014  . 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 Vitamin D deficiency 08/16/2013  . Spinal stenosis 06/20/2013  . OSA (obstructive sleep apnea)   . Tremor 12/30/2012  . History of kidney transplant 12/15/2012  . Immunosuppressive management encounter following kidney transplant 12/15/2012  . S/P angioplasty with stent 09/07/2012  . Aortic valve replaced 09/07/2012  . Lumbar radiculopathy 09/06/2012  . ESRD on dialysis ([place]) 07/26/2012  . Hyperlipidemia 07/26/2012  . S/P AVR (aortic valve replacement) 06/23/2012  . Aortic valve disorder 06/14/2012  . Heart failure ([place]) 05/30/2012  . Thyroid nodule 06/26/2011  . CAD (coronary artery disease) 05/26/2011  . Essential hypertension 05/19/2011   PCP:  Leone Haven, MD Pharmacy:   [id number] [place], [name] - [address] [name] [address] Phone: [phone] Fax: [phone]  [facility] [place] [place] [facility] Portal to Registered [employer] Sites [place] [address] Phone: [phone] Fax: [phone]     Social Determinants of Health (SDOH) Interventions    Readmission Risk Interventions Readmission Risk Prevention Plan 07/14/2019 03/18/2019  Transportation Screening Complete Complete  HRI or Home Care Consult Complete -  Social Work Consult for [place] Planning/Counseling Complete -  [place] Screening Not Applicable -  Medication Review Press photographer) Complete Complete  PCP or Specialist appointment within 3-5 days of discharge - Complete  SW Recovery Care/Counseling Consult - Complete  [place] - Not Applicable  Some recent data might be hidden

## 2019-07-14 NOTE — Consult Note (Signed)
Renal Service Consult Note [facility]  Jeremy [name] 07/14/2019 [place] Requesting Physician:  Dr [name]  Reason for Consult:  ESRD pt here for cardiac issues HPI: The patient is a 64 y.o. year-old w/ hx of ICM, afib, HTN, CAD/ CABG, AoVR and ESRD on HD for elective admit for LHC w/ heparin bridging.  Pt had OP cardiac nuclear stress study done for exertional dyspnea and chest tightness similar to his anginal pain. Stress test was abnormal. Asked to see for HD.   Pt started HD in 1999, got 1st renal Tx a few yrs later which lasted 9 yrs then went back on HD until 2nd renal Tx in 2014 which lasted until 2017 , now back on HD in [place] f/b [facility] renal team.  Has L AVF. Never missed appts.    ROS  denies CP  no joint pain   no HA  no blurry vision  no rash  no diarrhea  no nausea/ vomiting    Past Medical History  Past Medical History:  Diagnosis Date  . Abnormal stress test 03/19/2018  . 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 Aortic stenosis    a. 2014 s/p mech AVR ([facility])-->chronic coumadin; b. 08/2018 Echo: Mild AS/AI. Nl fxn/gradient.  . Arthritis   . Atrial fibrillation ([place])   . CAD (coronary artery disease)    a. 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 02/2018 MV [facility]): large inf, infsept, inflat infarct w/ min rev, EF 30%; c. 06/2018 Cath: LM min irregs, LAD 60p, 95/30/69m, LCX min irregs, RCA 40p, 9m, 90d, RPAV 90, RPDA 95ost; c. 09/2018 CABGx3 (LIMA->LAD, VG->D1, VG->PDA).  . Chronic anticoagulation 06/2012   a. Coumadin in the setting of mech AVR and afib.  Marland Kitchen COVID-19 03/15/2019  . CPAP (continuous positive airway pressure) dependence   . Dialysis patient [facility]) 04/07/2006   Patient started HD around 1996,  had 1st transplant LLQ around 1998 until 2007 when they found renal cell cancer in transplant and both native kidneys, all were removed > went back on HD until 2nd  transplant RLQ in 2014 which lasted 3 yrs; it was embolized for suspected acute rejection. Is currently on HD MWF in [place] w/ [facility] nephrology.  . ESRD (end stage renal disease) ([place])    a. 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 1999 s/p Kidney transplant; c. 2014 Bilat nephrectomy (transplanted kidney resected) in the setting of renal cell carcinoma; d. PD until 11/2017-->HD.  Marland Kitchen Heart murmur   . HFrEF (heart failure with reduced ejection fraction) ([place])    a. 07/2016 Echo: EF 55-60%; b. 02/2018 MV: EF 30%; c. 04/2018 Echo: EF 35%; d. 08/2018 Echo: EF 30-35%. Glob HK w/o rwma. Mildly reduced RV fxn. Mod to sev dil LA. Nl AoV fxn.  Marland Kitchen Hx of colonoscopy    2009 by Dr. Laural Golden. Normal except for small submucosal lipoma. No evidence of polyps or colitis.  . Hyperlipidemia   . Hypertension   . Hypogonadism in male 07/25/2015  . Ischemic cardiomyopathy    a. 07/2016 Echo: EF 55-60%; b. 02/2018 MV: EF 30%; c. 04/2018 Echo: EF 35%; d. 08/2018 Echo: EF 30-35%.  . OSA (obstructive sleep apnea)    No CPAP  . Permanent atrial fibrillation (HCC)    a. CHA2DS2VASc = 3-->Coumadin (also mech AVR); b. 06/2018 Amio started 2/2 elevated rates.  . Renal cell cancer (  [name])   . Renal cell carcinoma ([place]) 08/04/2016   Past Surgical History  Past Surgical History:  Procedure Laterality Date  . AORTIC VALVE REPLACEMENT  3 /11 /2014   At [place]    . CAPD INSERTION N/A 02/19/2017   Procedure: LAPAROSCOPIC INSERTION CONTINUOUS AMBULATORY PERITONEAL DIALYSIS  (CAPD) CATHETER;  Surgeon: Algernon Huxley, MD;  Location: [facility] ORS;  Service: General;  Laterality: N/A;  . CORONARY ANGIOPLASTY WITH STENT PLACEMENT    . CORONARY ARTERY BYPASS GRAFT N/A 09/16/2018   Procedure: CORONARY ARTERY BYPASS GRAFT times three      with left internal mammary artery and endoharvest of right leg greater saphenous vein;  Surgeon: Melrose Nakayama, MD;  Location: [place];  Service: Open Heart  Surgery;  Laterality: N/A;  . DG AV DIALYSIS  SHUNT ACCESS EXIST*L* OR     left arm  . EPIGASTRIC HERNIA REPAIR N/A 02/19/2017   Procedure: HERNIA REPAIR EPIGASTRIC ADULT;  Surgeon: Deseree Zemaitis Bellow, MD;  Location: [facility] ORS;  Service: General;  Laterality: N/A;  . EYE SURGERY     bilateral cataract  . HERNIA REPAIR     UMBILICAL HERNIA  . HERNIA REPAIR  02/19/2017  . INNER EAR SURGERY     20 years ago  . KIDNEY TRANSPLANT    . Peritoneal Dialysis access placement  02/19/2017  . REMOVAL OF A DIALYSIS CATHETER N/A 11/23/2017   Procedure: REMOVAL OF A PERITONEAL DIALYSIS CATHETER;  Surgeon: Algernon Huxley, MD;  Location: [facility] ORS;  Service: Vascular;  Laterality: N/A;  . RIGHT/LEFT HEART CATH AND CORONARY ANGIOGRAPHY Bilateral 06/07/2018   Procedure: RIGHT/LEFT HEART CATH AND CORONARY ANGIOGRAPHY;  Surgeon: Wellington Hampshire, MD;  Location: [place] CV LAB;  Service: Cardiovascular;  Laterality: Bilateral;  . TEE WITHOUT CARDIOVERSION N/A 07/21/2016   Procedure: TRANSESOPHAGEAL ECHOCARDIOGRAM (TEE);  Surgeon: Wellington Hampshire, MD;  Location: [facility] ORS;  Service: Cardiovascular;  Laterality: N/A;  . TEE WITHOUT CARDIOVERSION N/A 09/16/2018   Procedure: TRANSESOPHAGEAL ECHOCARDIOGRAM (TEE);  Surgeon: Melrose Nakayama, MD;  Location: [place];  Service: Open Heart Surgery;  Laterality: N/A;   Family History  Family History  Problem Relation Age of Onset  . Cancer Mother        pancreatic  . Heart disease Father   . Diabetes Father    Social History  reports that he has never smoked. He has never used smokeless tobacco. He reports that he does not drink alcohol or use drugs. Allergies  Allergies  Allergen Reactions  . Statins     Other reaction(s): Arthralgias (intolerance), Myalgias (intolerance)  Pt taking pravastatin   . Sertraline Rash   Home medications Prior to Admission medications   Medication Sig Start Date End Date Taking? Authorizing Provider  acetaminophen (TYLENOL)  500 MG tablet Take 1,000 mg 2 (two) times daily as needed by mouth for moderate pain.    [provider]  allopurinol (ZYLOPRIM) 300 MG tablet Take 300 mg by mouth daily. 12/23/17   [provider]  aspirin EC 81 MG EC tablet Take 1 tablet (81 mg total) by mouth daily. 09/23/18   Gold, Wayne E, PA-C  carvedilol (COREG) 6.25 MG tablet TAKE 1 TABLET (6.25 MG TOTAL) BY MOUTH 2 (TWO) TIMES DAILY WITH A MEAL. 06/21/19   Wellington Hampshire, MD  cinacalcet (SENSIPAR) 30 MG tablet Take 30 mg by mouth every other day.     [provider]  ezetimibe (ZETIA) 10 MG tablet Take 1 tablet (10 mg total) by mouth daily. 04/29/19 07/28/19  Rise Mu, PA-C  lidocaine-prilocaine (EMLA) cream Apply 1 application topically as needed (for fistula).    [provider]  loratadine (CLARITIN) 10 MG tablet Take 10 mg by mouth daily.    [provider]  multivitamin (RENA-VIT) TABS tablet Take 1 tablet by mouth daily. 02/23/19   [provider]  nitroGLYCERIN (NITROSTAT) 0.4 MG SL tablet Place 1 tablet (0.4 mg total) under the tongue every 5 (five) minutes as needed for chest pain. 06/24/19 09/22/19  Loel Dubonnet, NP  omeprazole (PRILOSEC) 20 MG capsule Take 20 mg by mouth daily.    [provider]  pravastatin (PRAVACHOL) 20 MG tablet Take 1 tablet (20 mg total) by mouth every evening. 12/08/18 06/05/28  Wellington Hampshire, MD  predniSONE (DELTASONE) 5 MG tablet Take 5 mg by mouth daily with breakfast.  10/12/16   [provider]  sevelamer carbonate (RENVELA) 800 MG tablet Take 40,000 mg by mouth 3 (three) times daily with meals. 5 tabs per meal    [provider]  warfarin (COUMADIN) 1 MG tablet Take 1-2 tablets (1-2 mg total) by mouth See admin instructions. 1 mg all days , except 0.5 MG on Fridays..* OR AS DIRECTED Patient taking differently: Take 1 mg by mouth daily.  04/04/19   Leone Haven, MD     Vitals:   07/13/19 1755 07/13/19 1825  07/13/19 2044 07/14/19 0533  BP: (!) 104/58  (!) 99/58 117/76  Pulse:   99 92  Resp:   16 18  Temp:  99 F (37.2 C) 98.3 F (36.8 C) 98.1 F (36.7 C)  TempSrc:   Oral Oral  SpO2:  100% 99% 100%  Weight:    85 kg   Exam  Gen alert, no distress, calm No rash, cyanosis or gangrene Sclera anicteric, throat clear  No jvd or bruits Chest clear bilat to bases RRR no MRG Abd soft ntnd no mass or ascites +bs GU normal male MS no joint effusions or deformity Ext no LE or UE edema, no wounds or ulcers Neuro is alert, Ox 3 , nf  LUA AVF+bruit    Home meds:  - coreg 6.25 bid  - asa 81/ zetia 10/ sl ntg prn/ pravachol 20  - warfarin as directed  - prilosec 20/ allopurinol 300  - sensipar 30/ renvela 5 ac tid  - prednisone 5 qd  - prn's/ vitamins/ supplements        Outpt HD:  MWF [facility]   3h 54min  450/800  85.5kg   Hep none   L AVF   Assessment/ Plan: 1. Exertional angina/ +stress test/ hx CABG / hx CAD - for LHC tomorrow 2. ESRD - usual HD MWF.  Will plan HD later tomorrow post-cath assuming pt is stable. Will follow.  3. HTN/ vol - no vol excess on exam 4. Atrial fib - per cardiology 5. Chron syst HF - no sign resp issues or vol overload 6. Anemia ckd - Hb 10- 11 range, follow      Kelly Splinter  MD 07/14/2019, 2:35 PM  Recent Labs  Lab 07/13/19 1849 07/14/19 0222  WBC 5.6 5.6  HGB 11.1* 10.1*   Recent Labs  Lab 07/14/19 0222  K 4.7  BUN 26*  CREATININE 5.80*  CALCIUM 8.7*

## 2019-07-14 NOTE — Progress Notes (Signed)
ANTICOAGULATION CONSULT NOTE - Follow Up Consult  Pharmacy Consult for heparin Indication: AVR/CAD  Labs: Recent Labs    07/13/19 1849 07/14/19 0222  HGB 11.1* 10.1*  HCT 36.2* 32.3*  PLT 128* 111*  LABPROT 20.7* 21.2*  INR 1.8* 1.8*  HEPARINUNFRC  --  0.13*  CREATININE  --  5.80*    Assessment: 63yo male subtherapeutic on heparin with initial dosing while Coumadin on hold, may need more time to accumulate; no gtt issues or signs of bleeding per RN.  Goal of Therapy:  Heparin level 0.3-0.7 units/ml   Plan:  Will increase heparin gtt by 1 unit/kg/hr to 1350 units/hr and check level in 8 hours.    Wynona Neat, PharmD, BCPS  07/14/2019,3:03 AM

## 2019-07-14 NOTE — Progress Notes (Signed)
ANTICOAGULATION CONSULT NOTE - Initial Consult  Pharmacy Consult for heparin Indication: afib and mechanical AVR  Allergies  Allergen Reactions  . Statins     Other reaction(s): Arthralgias (intolerance), Myalgias (intolerance)  Pt taking pravastatin   . Sertraline Rash    Patient Measurements: Weight: 85 kg (187 lb 6.4 oz) Heparin Dosing Weight: 85kg  Vital Signs: Temp: 98.4 F (36.9 C) (04/08 2116) Temp Source: Oral (04/08 2116) BP: 115/96 (04/08 1700) Pulse Rate: 59 (04/08 2116)  Labs: Recent Labs    07/13/19 1849 07/14/19 0222 07/14/19 0222 07/14/19 0447 07/14/19 1050 07/14/19 2102  HGB 11.1* 10.1*  --   --   --   --   HCT 36.2* 32.3*  --   --   --   --   PLT 128* 111*  --   --   --   --   LABPROT 20.7* 21.2*  --   --   --   --   INR 1.8* 1.8*  --   --   --   --   HEPARINUNFRC  --  0.13*   < > 0.26* 0.25* 0.50  CREATININE  --  5.80*  --   --   --   --    < > = values in this interval not displayed.    Estimated Creatinine Clearance: 13.6 mL/min (A) (by C-G formula based on SCr of 5.8 mg/dL (H)).   Medical History: Past Medical History:  Diagnosis Date  . Abnormal stress test 03/19/2018  . 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 Aortic stenosis    a. 2014 s/p mech AVR ([facility])-->chronic coumadin; b. 08/2018 Echo: Mild AS/AI. Nl fxn/gradient.  . Arthritis   . Atrial fibrillation ([place])   . CAD (coronary artery disease)    a. 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 02/2018 MV [facility]): large inf, infsept, inflat infarct w/ min rev, EF 30%; c. 06/2018 Cath: LM min irregs, LAD 60p, 95/30/34m, LCX min irregs, RCA 40p, 50m, 90d, RPAV 90, RPDA 95ost; c. 09/2018 CABGx3 (LIMA->LAD, VG->D1, VG->PDA).  . Chronic anticoagulation 06/2012   a. Coumadin in the setting of mech AVR and afib.  Marland Kitchen COVID-19 03/15/2019  . CPAP (continuous positive airway pressure) dependence   . Dialysis patient [facility]) 04/07/2006   Patient started HD around 1996,  had 1st transplant LLQ around 1998 until 2007 when they found renal cell cancer in transplant and both native kidneys, all were removed > went back on HD until 2nd transplant RLQ in 2014 which lasted 3 yrs; it was embolized for suspected acute rejection. Is currently on HD MWF in [place] w/ [facility] nephrology.  . ESRD (end stage renal disease) ([place])    a. 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 1999 s/p Kidney transplant; c. 2014 Bilat nephrectomy (transplanted kidney resected) in the setting of renal cell carcinoma; d. PD until 11/2017-->HD.  Marland Kitchen Heart murmur   . HFrEF (heart failure with reduced ejection fraction) ([place])    a. 07/2016 Echo: EF 55-60%; b. 02/2018 MV: EF 30%; c. 04/2018 Echo: EF 35%; d. 08/2018 Echo: EF 30-35%. Glob HK w/o rwma. Mildly reduced RV fxn. Mod to sev dil LA. Nl AoV fxn.  Marland Kitchen Hx of colonoscopy    2009 by Dr. Laural Golden. Normal except for small submucosal lipoma. No evidence of polyps or colitis.  . Hyperlipidemia   . Hypertension   . Hypogonadism in  male 07/25/2015  . Ischemic cardiomyopathy    a. 07/2016 Echo: EF 55-60%; b. 02/2018 MV: EF 30%; c. 04/2018 Echo: EF 35%; d. 08/2018 Echo: EF 30-35%.  . OSA (obstructive sleep apnea)    No CPAP  . Permanent atrial fibrillation (HCC)    a. CHA2DS2VASc = 3-->Coumadin (also mech AVR); b. 06/2018 Amio started 2/2 elevated rates.  . Renal cell cancer ([place])   . Renal cell carcinoma ([place]) 08/04/2016   Assessment: 64 year old male with history of afib and mechanical aortic valve replacement. Patient being admitted for cath on Friday 4/9.   Reviewing chart; last INR from home POCT was 2.1 on 4/5 and patient was instructed to take warfarin that night and then hold further dosing for cath.  4/8 PM: Heparin level 0.5 (therapeutic) on heparin gtt at 1500 units/hr. Hbg 10.1 (stable); Plts 111 (stable). No concerns for bleeding or line issues.   Goal of Therapy:  Heparin level 0.3 - 0.5 units/mL  Monitor  platelets by anticoagulation protocol: Yes   Plan:  Continue Heparin gtt at 1500 units/hr Confirmatory heparin level in AM Monitor daily heparin level, CBC, and sx/s of bleeding  Planned Cath 4/9; F/u post-cath plan   Veer Elamin A. Levada Dy, PharmD, BCPS, FNKF Clinical Pharmacist [place] Please utilize Amion for appropriate phone number to reach the unit pharmacist ([place])   07/14/2019 10:00 PM

## 2019-07-15 ENCOUNTER — Encounter (HOSPITAL_COMMUNITY)
Admission: RE | Disposition: A | Discharge status: Home / Self Care | Payer: Medicare Other | Source: Home / Self Care | Attending: Cardiovascular Disease

## 2019-07-15 ENCOUNTER — Ambulatory Visit (HOSPITAL_COMMUNITY): Admission: RE | Admit: 2019-07-15 | Payer: Medicare Other | Source: Home / Self Care | Admitting: Cardiovascular Disease

## 2019-07-15 DIAGNOSIS — I4821 Permanent atrial fibrillation: Secondary | ICD-10-CM

## 2019-07-15 DIAGNOSIS — I25710 Atherosclerosis of autologous vein coronary artery bypass graft(s) with unstable angina pectoris: Secondary | ICD-10-CM

## 2019-07-15 DIAGNOSIS — I25110 Atherosclerotic heart disease of native coronary artery with unstable angina pectoris: Principal | ICD-10-CM

## 2019-07-15 HISTORY — PX: LEFT HEART CATH AND CORS/GRAFTS ANGIOGRAPHY: CATH118250

## 2019-07-15 HISTORY — PX: CORONARY STENT INTERVENTION: CATH118234

## 2019-07-15 LAB — POCT ACTIVATED CLOTTING TIME: Activated Clotting Time: 230 seconds

## 2019-07-15 LAB — CBC
HCT: 31.5 % — ABNORMAL LOW (ref 39.0–52.0)
Hemoglobin: 9.9 g/dL — ABNORMAL LOW (ref 13.0–17.0)
MCH: 33.9 pg (ref 26.0–34.0)
MCHC: 31.4 g/dL (ref 30.0–36.0)
MCV: 107.9 fL — ABNORMAL HIGH (ref 80.0–100.0)
Platelets: 104 10*3/uL — ABNORMAL LOW (ref 150–400)
RBC: 2.92 MIL/uL — ABNORMAL LOW (ref 4.22–5.81)
RDW: 13.5 % (ref 11.5–15.5)
WBC: 5.9 10*3/uL (ref 4.0–10.5)
nRBC: 0 % (ref 0.0–0.2)

## 2019-07-15 LAB — BASIC METABOLIC PANEL
Anion gap: 16 — ABNORMAL HIGH (ref 5–15)
BUN: 43 mg/dL — ABNORMAL HIGH (ref 8–23)
CO2: 29 mmol/L (ref 22–32)
Calcium: 9.1 mg/dL (ref 8.9–10.3)
Chloride: 94 mmol/L — ABNORMAL LOW (ref 98–111)
Creatinine, Ser: 8.39 mg/dL — ABNORMAL HIGH (ref 0.61–1.24)
GFR calc Af Amer: 7 mL/min — ABNORMAL LOW (ref >60)
GFR calc non Af Amer: 6 mL/min — ABNORMAL LOW (ref >60)
Glucose, Bld: 98 mg/dL (ref 70–99)
Potassium: 4.6 mmol/L (ref 3.5–5.1)
Sodium: 139 mmol/L (ref 135–145)

## 2019-07-15 LAB — PROTIME-INR
INR: 1.4 — ABNORMAL HIGH (ref 0.8–1.2)
Prothrombin Time: 17 seconds — ABNORMAL HIGH (ref 11.4–15.2)

## 2019-07-15 LAB — HEPARIN LEVEL (UNFRACTIONATED): Heparin Unfractionated: 0.55 IU/mL (ref 0.30–0.70)

## 2019-07-15 SURGERY — LEFT HEART CATH AND CORS/GRAFTS ANGIOGRAPHY
Anesthesia: LOCAL

## 2019-07-15 MED ORDER — WARFARIN SODIUM 2 MG PO TABS
2.0000 mg | ORAL_TABLET | Freq: Once | ORAL | Status: AC
  Administered 2019-07-15: 2 mg via ORAL
  Filled 2019-07-15: qty 1

## 2019-07-15 MED ORDER — MIDAZOLAM HCL 2 MG/2ML IJ SOLN
INTRAMUSCULAR | Status: DC | PRN
  Administered 2019-07-15 (×2): 1 mg via INTRAVENOUS

## 2019-07-15 MED ORDER — SODIUM CHLORIDE 0.9 % IV SOLN: 250.0000 mL | INTRAVENOUS | Status: DC | PRN

## 2019-07-15 MED ORDER — HEPARIN SODIUM (PORCINE) 1000 UNIT/ML IJ SOLN
INTRAMUSCULAR | Status: DC | PRN
  Administered 2019-07-15: 7000 [IU] via INTRAVENOUS
  Administered 2019-07-15: 1500 [IU] via INTRAVENOUS

## 2019-07-15 MED ORDER — WARFARIN - PHARMACIST DOSING INPATIENT: Freq: Every day | Status: DC

## 2019-07-15 MED ORDER — LIDOCAINE HCL (PF) 1 % IJ SOLN
INTRAMUSCULAR | Status: DC | PRN
  Administered 2019-07-15: 15 mL via INTRADERMAL

## 2019-07-15 MED ORDER — CLOPIDOGREL BISULFATE 75 MG PO TABS
75.0000 mg | ORAL_TABLET | Freq: Every day | ORAL | Status: DC
  Administered 2019-07-16 – 2019-07-18 (×3): 75 mg via ORAL
  Filled 2019-07-15 (×4): qty 1

## 2019-07-15 MED ORDER — CLOPIDOGREL BISULFATE 300 MG PO TABS
ORAL_TABLET | ORAL | Status: DC | PRN
  Administered 2019-07-15: 600 mg via ORAL

## 2019-07-15 MED ORDER — SODIUM CHLORIDE 0.9% FLUSH: 3.0000 mL | Freq: Two times a day (BID) | INTRAVENOUS | Status: DC

## 2019-07-15 MED ORDER — FENTANYL CITRATE (PF) 100 MCG/2ML IJ SOLN
INTRAMUSCULAR | Status: DC | PRN
  Administered 2019-07-15 (×2): 50 ug via INTRAVENOUS

## 2019-07-15 MED ORDER — METOPROLOL TARTRATE 25 MG PO TABS
25.0000 mg | ORAL_TABLET | Freq: Two times a day (BID) | ORAL | Status: DC
  Administered 2019-07-15 – 2019-07-18 (×6): 25 mg via ORAL
  Filled 2019-07-15 (×6): qty 1

## 2019-07-15 MED ORDER — IOHEXOL 350 MG/ML SOLN
INTRAVENOUS | Status: DC | PRN
  Administered 2019-07-15: 130 mL

## 2019-07-15 MED ORDER — HEPARIN (PORCINE) 25000 UT/250ML-% IV SOLN
1500.0000 [IU]/h | INTRAVENOUS | Status: DC
  Administered 2019-07-16: 1400 [IU]/h via INTRAVENOUS
  Administered 2019-07-17: 1500 [IU]/h via INTRAVENOUS
  Filled 2019-07-15 (×3): qty 250

## 2019-07-15 MED ORDER — HEPARIN (PORCINE) IN NACL 1000-0.9 UT/500ML-% IV SOLN
INTRAVENOUS | Status: DC | PRN
  Administered 2019-07-15 (×2): 500 mL

## 2019-07-15 MED ORDER — SODIUM CHLORIDE 0.9% FLUSH: 3.0000 mL | INTRAVENOUS | Status: DC | PRN

## 2019-07-15 SURGICAL SUPPLY — 18 items
BALLN SAPPHIRE 3.0X12 (BALLOONS) ×2
BALLOON SAPPHIRE 3.0X12 (BALLOONS) IMPLANT
CATH EXPO 5F MPA-1 (CATHETERS) ×1 IMPLANT
CATH INFINITI 5FR MULTPACK ANG (CATHETERS) ×1 IMPLANT
CATH VISTA GUIDE 6FR MPA1 (CATHETERS) ×1 IMPLANT
CLOSURE MYNX CONTROL 6F/7F (Vascular Products) ×1 IMPLANT
KIT ENCORE 26 ADVANTAGE (KITS) ×1 IMPLANT
KIT HEART LEFT (KITS) ×2 IMPLANT
KIT MICROPUNCTURE NIT STIFF (SHEATH) ×1 IMPLANT
PACK CARDIAC CATHETERIZATION (CUSTOM PROCEDURE TRAY) ×2 IMPLANT
SHEATH PINNACLE 5F 10CM (SHEATH) ×1 IMPLANT
SHEATH PINNACLE 6F 10CM (SHEATH) ×1 IMPLANT
SHEATH PROBE COVER 6X72 (BAG) ×1 IMPLANT
STENT RESOLUTE ONYX 4.5X15 (Permanent Stent) ×1 IMPLANT
TRANSDUCER W/STOPCOCK (MISCELLANEOUS) ×2 IMPLANT
TUBING CIL FLEX 10 FLL-RA (TUBING) ×2 IMPLANT
WIRE EMERALD 3MM-J .035X150CM (WIRE) ×1 IMPLANT
WIRE RUNTHROUGH .014X180CM (WIRE) ×1 IMPLANT

## 2019-07-15 NOTE — Progress Notes (Signed)
ANTICOAGULATION CONSULT NOTE - Initial Consult  Pharmacy Consult for heparin Indication: afib and mechanical AVR  Allergies  Allergen Reactions  . Statins     Other reaction(s): Arthralgias (intolerance), Myalgias (intolerance)  Pt taking pravastatin   . Sertraline Rash    Patient Measurements: Height: 5\' 10"  (177.8 cm) Weight: 86.5 kg (190 lb 9.6 oz) IBW/kg (Calculated) : 73 Heparin Dosing Weight: 85kg  Vital Signs: Temp: 97.7 F (36.5 C) (04/09 0450) Temp Source: Oral (04/09 0450) BP: 129/81 (04/09 0450) Pulse Rate: 80 (04/09 0450)  Labs: Recent Labs    07/13/19 1849 07/13/19 1849 07/14/19 0222 07/14/19 0447 07/14/19 1050 07/14/19 2102 07/15/19 0341  HGB 11.1*   < > 10.1*  --   --   --  9.9*  HCT 36.2*  --  32.3*  --   --   --  31.5*  PLT 128*  --  111*  --   --   --  104*  LABPROT 20.7*  --  21.2*  --   --   --  17.0*  INR 1.8*  --  1.8*  --   --   --  1.4*  HEPARINUNFRC  --   --  0.13*   < > 0.25* 0.50 0.55  CREATININE  --   --  5.80*  --   --   --  8.39*   < > = values in this interval not displayed.    Estimated Creatinine Clearance: 9.4 mL/min (A) (by C-G formula based on SCr of 8.39 mg/dL (H)).   Medical History: Past Medical History:  Diagnosis Date  . Abnormal stress test 03/19/2018  . 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 Aortic stenosis    a. 2014 s/p mech AVR ([facility])-->chronic coumadin; b. 08/2018 Echo: Mild AS/AI. Nl fxn/gradient.  . Arthritis   . Atrial fibrillation ([place])   . CAD (coronary artery disease)    a. 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 02/2018 MV [facility]): large inf, infsept, inflat infarct w/ min rev, EF 30%; c. 06/2018 Cath: LM min irregs, LAD 60p, 95/30/33m, LCX min irregs, RCA 40p, 50m, 90d, RPAV 90, RPDA 95ost; c. 09/2018 CABGx3 (LIMA->LAD, VG->D1, VG->PDA).  . Chronic anticoagulation 06/2012   a. Coumadin in the setting of mech AVR and afib.  Marland Kitchen COVID-19  03/15/2019  . CPAP (continuous positive airway pressure) dependence   . Dialysis patient [facility]) 04/07/2006   Patient started HD around 1996,  had 1st transplant LLQ around 1998 until 2007 when they found renal cell cancer in transplant and both native kidneys, all were removed > went back on HD until 2nd transplant RLQ in 2014 which lasted 3 yrs; it was embolized for suspected acute rejection. Is currently on HD MWF in [place] w/ [facility] nephrology.  . ESRD (end stage renal disease) ([place])    a. 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 1999 s/p Kidney transplant; c. 2014 Bilat nephrectomy (transplanted kidney resected) in the setting of renal cell carcinoma; d. PD until 11/2017-->HD.  Marland Kitchen Heart murmur   . HFrEF (heart failure with reduced ejection fraction) ([place])    a. 07/2016 Echo: EF 55-60%; b. 02/2018 MV: EF 30%; c. 04/2018 Echo: EF 35%; d. 08/2018 Echo: EF 30-35%. Glob HK w/o rwma. Mildly reduced RV fxn. Mod to sev dil LA. Nl AoV fxn.  Marland Kitchen Hx of colonoscopy    2009 by Dr. Laural Golden. Normal except  for small submucosal lipoma. No evidence of polyps or colitis.  . Hyperlipidemia   . Hypertension   . Hypogonadism in male 07/25/2015  . Ischemic cardiomyopathy    a. 07/2016 Echo: EF 55-60%; b. 02/2018 MV: EF 30%; c. 04/2018 Echo: EF 35%; d. 08/2018 Echo: EF 30-35%.  . OSA (obstructive sleep apnea)    No CPAP  . Permanent atrial fibrillation (HCC)    a. CHA2DS2VASc = 3-->Coumadin (also mech AVR); b. 06/2018 Amio started 2/2 elevated rates.  . Renal cell cancer ([place])   . Renal cell carcinoma ([place]) 08/04/2016   Assessment: 64 year old male with history of afib and mechanical aortic valve replacement. Reviewing chart; last INR from home POCT was 2.1 on 4/5 and patient was instructed to take warfarin that night and then hold further dosing for cath. Patient has planned cath today, 4/9.    4/9 AM: Heparin level 0.55 (therapeutic) on heparin gtt at 1500 units/hr. Hbg 9.9 (stable); Plts 101 (stable). No concerns  for bleeding or line issues.   Goal of Therapy:  Heparin level 0.3 - 0.5 units/mL  Monitor platelets by anticoagulation protocol: Yes   Plan:  Continue Heparin gtt at 1500 units/hr Monitor daily heparin level, CBC, and sx/s of bleeding  Planned Cath 4/9; F/u post-cath plan   Acey Lav, PharmD  PGY1 Acute Care Pharmacy Resident 07/15/2019 7:28 AM

## 2019-07-15 NOTE — Interval H&P Note (Signed)
Cath Lab Visit (complete for each Cath Lab visit)  Clinical Evaluation Leading to the Procedure:   ACS: No.  Non-ACS:    Anginal Classification: CCS III  Anti-ischemic medical therapy: Maximal Therapy (2 or more classes of medications)  Non-Invasive Test Results: High-risk stress test findings: cardiac mortality >3%/year  Prior CABG: Previous CABG      History and Physical Interval Note:  07/15/2019 8:11 AM  Jeremy [name]  has presented today for surgery, with the diagnosis of abnormal stress test.  The various methods of treatment have been discussed with the patient and family. After consideration of risks, benefits and other options for treatment, the patient has consented to  Procedure(s): LEFT HEART CATH AND CORS/GRAFTS ANGIOGRAPHY (N/A) as a surgical intervention.  The patient's history has been reviewed, patient examined, no change in status, stable for surgery.  I have reviewed the patient's chart and labs.  Questions were answered to the patient's satisfaction.     Kathlyn [place]

## 2019-07-15 NOTE — Progress Notes (Addendum)
[place] for heparin>>warfarin Indication: afib and mechanical AVR  Allergies  Allergen Reactions  . Statins     Other reaction(s): Arthralgias (intolerance), Myalgias (intolerance)  Pt taking pravastatin   . Sertraline Rash    Patient Measurements: Height: 5\' 10"  (177.8 cm) Weight: 86.5 kg (190 lb 9.6 oz) IBW/kg (Calculated) : 73 Heparin Dosing Weight: 85kg  Vital Signs: Temp: 97.7 F (36.5 C) (04/09 0450) Temp Source: Oral (04/09 0450) BP: 133/89 (04/09 1000) Pulse Rate: 59 (04/09 1000)  Labs: Recent Labs    07/13/19 1849 07/13/19 1849 07/14/19 0222 07/14/19 0447 07/14/19 1050 07/14/19 2102 07/15/19 0341  HGB 11.1*   < > 10.1*  --   --   --  9.9*  HCT 36.2*  --  32.3*  --   --   --  31.5*  PLT 128*  --  111*  --   --   --  104*  LABPROT 20.7*  --  21.2*  --   --   --  17.0*  INR 1.8*  --  1.8*  --   --   --  1.4*  HEPARINUNFRC  --   --  0.13*   < > 0.25* 0.50 0.55  CREATININE  --   --  5.80*  --   --   --  8.39*   < > = values in this interval not displayed.    Estimated Creatinine Clearance: 9.4 mL/min (A) (by C-G formula based on SCr of 8.39 mg/dL (H)).   Medical History: Past Medical History:  Diagnosis Date  . Abnormal stress test 03/19/2018  . 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 Aortic stenosis    a. 2014 s/p mech AVR ([facility])-->chronic coumadin; b. 08/2018 Echo: Mild AS/AI. Nl fxn/gradient.  . Arthritis   . Atrial fibrillation ([place])   . CAD (coronary artery disease)    a. 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 02/2018 MV [facility]): large inf, infsept, inflat infarct w/ min rev, EF 30%; c. 06/2018 Cath: LM min irregs, LAD 60p, 95/30/55m, LCX min irregs, RCA 40p, 55m, 90d, RPAV 90, RPDA 95ost; c. 09/2018 CABGx3 (LIMA->LAD, VG->D1, VG->PDA).  . Chronic anticoagulation 06/2012   a. Coumadin in the setting of mech AVR and afib.  Marland Kitchen COVID-19 03/15/2019   . CPAP (continuous positive airway pressure) dependence   . Dialysis patient [facility]) 04/07/2006   Patient started HD around 1996,  had 1st transplant LLQ around 1998 until 2007 when they found renal cell cancer in transplant and both native kidneys, all were removed > went back on HD until 2nd transplant RLQ in 2014 which lasted 3 yrs; it was embolized for suspected acute rejection. Is currently on HD MWF in [place] w/ [facility] nephrology.  . ESRD (end stage renal disease) ([place])    a. 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 1999 s/p Kidney transplant; c. 2014 Bilat nephrectomy (transplanted kidney resected) in the setting of renal cell carcinoma; d. PD until 11/2017-->HD.  Marland Kitchen Heart murmur   . HFrEF (heart failure with reduced ejection fraction) ([place])    a. 07/2016 Echo: EF 55-60%; b. 02/2018 MV: EF 30%; c. 04/2018 Echo: EF 35%; d. 08/2018 Echo: EF 30-35%. Glob HK w/o rwma. Mildly reduced RV fxn. Mod to sev dil LA. Nl AoV fxn.  Marland Kitchen Hx of colonoscopy    2009 by Dr. Laural Golden. Normal except for small submucosal  lipoma. No evidence of polyps or colitis.  . Hyperlipidemia   . Hypertension   . Hypogonadism in male 07/25/2015  . Ischemic cardiomyopathy    a. 07/2016 Echo: EF 55-60%; b. 02/2018 MV: EF 30%; c. 04/2018 Echo: EF 35%; d. 08/2018 Echo: EF 30-35%.  . OSA (obstructive sleep apnea)    No CPAP  . Permanent atrial fibrillation ([facility])    a. CHA2DS2VASc = 3-->Coumadin (also mech AVR); b. 06/2018 [place] started 2/2 elevated rates.  . Renal cell cancer ([place])   . Renal cell carcinoma ([place]) 08/04/2016   Assessment: 64 year old male with history of afib and mechanical aortic valve replacement. Reviewing chart; last INR from home POCT was 2.1 on 4/5 and patient was instructed to take warfarin that night and then hold further dosing for cath  4/9 AM: Heparin level 0.55 (therapeutic) on heparin gtt at 1500 units/hr. Hbg 9.9 (stable); Plts 101 (stable).  Post cath small hematoma noted. Discussed anticoagulation plan  with cardiology and they have laid out instructions in their note. Will resume warfarin tonight, will give extra with INR 1.4. No heparin tonight given hematoma but will plan to start first thing in the morning, no bolus.   Possible d/c home in 48-72 hours pending uptrend in INR.   Goal of Therapy:  INR goal 2.5-3.5 Heparin level 0.3 - 0.5 units/mL  Monitor platelets by anticoagulation protocol: Yes   Plan:  Restart Heparin gtt at 1400 units/hr - 7am tomorrow Warfarin 2mg  tonight Monitor daily heparin level, CBC, and sx/s of bleeding  Will watch hematoma closely  Erin Hearing PharmD., BCPS Clinical Pharmacist 07/15/2019 1:54 PM

## 2019-07-15 NOTE — Progress Notes (Signed)
Notified primary RN Sam at [phone] that HD treatment  Will be 07/16/2019 in am per Dr Jonnie Finner

## 2019-07-15 NOTE — Progress Notes (Signed)
Jeremy [name] Progress Note  Subjective: had PCI/ stent to diseased SVG-to-RCA today. On plavix, resuming coumadin.   Vitals:   07/15/19 0923 07/15/19 0945 07/15/19 1000 07/15/19 1526  BP:  (!) 134/92 133/89 109/60  Pulse: (!) 0 (!) 56 (!) 59 81  Resp: (!) 0 16    Temp:    98 F (36.7 C)  TempSrc:    Oral  SpO2: (!) 0% 99% 97% 100%  Weight:      Height:        Exam: Gen alert, no distress, calm No jvd or bruits Chest clear bilat to bases RRR no MRG Abd soft ntnd no mass or ascites +bs Ext no edema  Neuro is alert, Ox 3 , nf  LUA AVF+bruit    Home meds:  - coreg 6.25 bid  - asa 81/ zetia 10/ sl ntg prn/ pravachol 20  - warfarin as directed  - prilosec 20/ allopurinol 300  - sensipar 30/ renvela 5 ac tid  - prednisone 5 qd  - prn's/ vitamins/ supplements        Outpt HD:  MWF [facility]   3h 53min  450/800  85.5kg   Hep none   L AVF   Assessment/ Plan: 1. Exertional angina/ +stress test/ hx CABG / hx CAD - sp PCI of SVG-to-RCA lesion today.  2. ESRD - usual HD MWF.  HD tonight or may have to postpone to tomorrow d/t high census. Pt stable clinically.  3. HTN/ vol - no vol excess on exam 4. Atrial fib - per cardiology 5. Chron syst HF - no sign resp issues or vol overload 6. Anemia ckd - Hb 10- 11 range, follow       Rob Tereasa Yilmaz 07/15/2019, 5:01 PM   Recent Labs  Lab 07/14/19 0222 07/15/19 0341  K 4.7 4.6  BUN 26* 43*  CREATININE 5.80* 8.39*  CALCIUM 8.7* 9.1  HGB 10.1* 9.9*   Inpatient medications: . allopurinol  300 mg Oral Daily  . aspirin EC  81 mg Oral Daily  . Chlorhexidine Gluconate Cloth  6 each Topical Q0600  . cinacalcet  30 mg Oral QODAY  . [START ON 07/16/2019] clopidogrel  75 mg Oral Q breakfast  . ezetimibe  10 mg Oral Daily  . loratadine  10 mg Oral Daily  . metoprolol tartrate  25 mg Oral BID  . multivitamin  1 tablet Oral Daily  . pantoprazole  40 mg Oral Daily  . predniSONE  5 mg Oral  Q breakfast  . rosuvastatin  40 mg Oral q1800  . sevelamer carbonate  4,000 mg Oral TID WC  . sodium chloride flush  3 mL Intravenous Q12H  . warfarin  2 mg Oral ONCE-1600  . Warfarin - Pharmacist Dosing Inpatient   Does not apply q1600   . sodium chloride    . [START ON 07/16/2019] heparin     sodium chloride, acetaminophen, lidocaine-prilocaine, nitroGLYCERIN, ondansetron (ZOFRAN) IV, sodium chloride flush

## 2019-07-15 NOTE — Progress Notes (Signed)
Progress Note  Patient Name: Jeremy [name] Date of Encounter: 07/15/2019  Primary Cardiologist: Kathlyn Sacramento, MD   Subjective   Doing well after cardiac catheterization/PCI this morning.  Girlfriend is at bedside.  No chest pain or shortness of breath this morning.  Inpatient Medications    Scheduled Meds: . allopurinol  300 mg Oral Daily  . aspirin EC  81 mg Oral Daily  . Chlorhexidine Gluconate Cloth  6 each Topical Q0600  . cinacalcet  30 mg Oral QODAY  . [START ON 07/16/2019] clopidogrel  75 mg Oral Q breakfast  . ezetimibe  10 mg Oral Daily  . loratadine  10 mg Oral Daily  . metoprolol tartrate  25 mg Oral BID  . multivitamin  1 tablet Oral Daily  . pantoprazole  40 mg Oral Daily  . predniSONE  5 mg Oral Q breakfast  . rosuvastatin  40 mg Oral q1800  . sevelamer carbonate  4,000 mg Oral TID WC  . sodium chloride flush  3 mL Intravenous Q12H   Continuous Infusions: . sodium chloride     PRN Meds: sodium chloride, acetaminophen, lidocaine-prilocaine, nitroGLYCERIN, ondansetron (ZOFRAN) IV, sodium chloride flush   Vital Signs    Vitals:   07/15/19 0918 07/15/19 0923 07/15/19 0945 07/15/19 1000  BP: 124/69  (!) 134/92 133/89  Pulse: 84 (!) 0 (!) 56 (!) 59  Resp: (!) 33 (!) 0 16   Temp:      TempSrc:      SpO2: 100% (!) 0% 99% 97%  Weight:      Height:        Intake/Output Summary (Last 24 hours) at 07/15/2019 1230 Last data filed at 07/14/2019 2200 Gross per 24 hour  Intake 814.86 ml  Output -  Net 814.86 ml   Last 3 Weights 07/15/2019 07/14/2019 07/05/2019  Weight (lbs) 190 lb 9.6 oz 187 lb 6.4 oz 188 lb  Weight (kg) 86.456 kg 85.004 kg 85.276 kg      Telemetry    Atrial fibrillation, heart rate 90s - Personally Reviewed   Physical Exam  Alert, oriented male in no distress GEN: No acute distress.   Neck: No JVD Cardiac:  Irregularly irregular, 2/6 systolic ejection murmur at the right upper sternal border, normal mechanical A2 Respiratory: Clear to  auscultation bilaterally. GI: Soft, nontender, non-distended  MS: No edema; No deformity.  Small right groin hematoma present, minimal tenderness Neuro:  Nonfocal  Psych: Normal affect   Labs    High Sensitivity Troponin:  No results for input(s): TROPONINIHS in the last 720 hours.    Chemistry Recent Labs  Lab 07/14/19 0222 07/15/19 0341  NA 139 139  K 4.7 4.6  CL 95* 94*  CO2 30 29  GLUCOSE 148* 98  BUN 26* 43*  CREATININE 5.80* 8.39*  CALCIUM 8.7* 9.1  GFRNONAA 10* 6*  GFRAA 11* 7*  ANIONGAP 14 16*     Hematology Recent Labs  Lab 07/13/19 1849 07/14/19 0222 07/15/19 0341  WBC 5.6 5.6 5.9  RBC 3.32* 3.00* 2.92*  HGB 11.1* 10.1* 9.9*  HCT 36.2* 32.3* 31.5*  MCV 109.0* 107.7* 107.9*  MCH 33.4 33.7 33.9  MCHC 30.7 31.3 31.4  RDW 13.4 13.4 13.5  PLT 128* 111* 104*    BNPNo results for input(s): BNP, PROBNP in the last 168 hours.   DDimer No results for input(s): DDIMER in the last 168 hours.   Radiology    CARDIAC CATHETERIZATION  Result Date: 07/15/2019  Prox LAD  lesion is 90% stenosed.  Mid LAD-1 lesion is 95% stenosed.  Mid LAD-2 lesion is 30% stenosed.  Mid LAD to Dist LAD lesion is 60% stenosed.  Prox RCA lesion is 40% stenosed.  Mid RCA lesion is 70% stenosed.  Dist RCA lesion is 99% stenosed with 99% stenosed side branch in RPAV.  Ost RPDA to RPDA lesion is 95% stenosed.  SVG.  Origin lesion is 100% stenosed.  LIMA graft was visualized by angiography.  The graft exhibits no disease.  Origin to Prox Graft lesion is 99% stenosed.  Post intervention, there is a 0% residual stenosis.  A drug-eluting stent was successfully placed using a STENT RESOLUTE ONYX 4.5X15.  1.  Severe underlying two-vessel coronary artery disease including the LAD and right coronary artery.  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  Patent LIMA to LAD and occluded SVG to diagonal.  The diagonal receives collaterals from the  distal LAD.  Patent SVG to right PDA with 99% ostial stenosis. 2.  Successful angioplasty and drug-eluting stent placement to SVG to right PDA. 3.  Small hematoma in the right groin noted during the diagnostic portion of the cath with suspecting oozing around the sheath due to heavy calcifications in the common femoral artery.  This improved with upsizing the sheath and manual pressure.  This was stable at the end of the case after deploying a minx closure device and holding additional 10 minutes of pressure. Recommendations: Resume heparin drip 8 hours after sheath pull after making sure that the right groin hematoma is stable. Resume warfarin tonight. The patient was started on clopidogrel and he is currently on low-dose aspirin.  I do not think he will tolerate triple therapy.  I recommend stopping aspirin before hospital discharge. For atrial fibrillation, I switch carvedilol to metoprolol tartrate 25 mg twice daily.    Cardiac Studies   Cardiac catheterization study reviewed and discussed with Dr. [name], findings outlined above  Patient Profile     64 y.o. male CAD status post CABG, presenting with progressive angina and dyspnea, abnormal nuclear stress test, heparin bridging for cardiac catheterization and possible PCI  Assessment & Plan    1.  CAD, native vessel and bypass graft disease, with angina: Status post PCI of the ostial saphenous vein graft to RCA distribution.  Discussed antiplatelet/anticoagulation with Dr. [name].  Probably best to treat with aspirin 81 mg only until INR therapeutic (5 to 7 days).  Resume warfarin tonight.  Continue clopidogrel 75 mg daily. 2.  Permanent atrial fibrillation: Occasions adjusted, now on metoprolol.  Can titrate up if needed as blood pressure tolerates.  Warfarin to resume tonight.  Wait to resume heparin until tomorrow morning with small groin hematoma and risk of bleeding. 3.  Mechanical aortic valve replacement: As above, restart warfarin tonight  and heparin tomorrow morning.  Probably okay for him to be discharged once his INR is trending upward after 48 hours of heparin. 4.  End-stage renal disease on hemodialysis: Appreciate nephrology care while he is hospitalized.  For questions or updates, please contact [place] Please consult [email or web address] for contact info under        Signed, Sherren Mocha, MD  07/15/2019, 12:30 PM

## 2019-07-16 DIAGNOSIS — N186 End stage renal disease: Secondary | ICD-10-CM

## 2019-07-16 DIAGNOSIS — Z952 Presence of prosthetic heart valve: Secondary | ICD-10-CM

## 2019-07-16 LAB — BASIC METABOLIC PANEL
Anion gap: 18 — ABNORMAL HIGH (ref 5–15)
BUN: 58 mg/dL — ABNORMAL HIGH (ref 8–23)
CO2: 27 mmol/L (ref 22–32)
Calcium: 9.1 mg/dL (ref 8.9–10.3)
Chloride: 90 mmol/L — ABNORMAL LOW (ref 98–111)
Creatinine, Ser: 10.51 mg/dL — ABNORMAL HIGH (ref 0.61–1.24)
GFR calc Af Amer: 5 mL/min — ABNORMAL LOW (ref >60)
GFR calc non Af Amer: 5 mL/min — ABNORMAL LOW (ref >60)
Glucose, Bld: 90 mg/dL (ref 70–99)
Potassium: 6.2 mmol/L — ABNORMAL HIGH (ref 3.5–5.1)
Sodium: 135 mmol/L (ref 135–145)

## 2019-07-16 LAB — PROTIME-INR
INR: 1.5 — ABNORMAL HIGH (ref 0.8–1.2)
Prothrombin Time: 18 seconds — ABNORMAL HIGH (ref 11.4–15.2)

## 2019-07-16 LAB — CBC
HCT: 30.7 % — ABNORMAL LOW (ref 39.0–52.0)
Hemoglobin: 9.7 g/dL — ABNORMAL LOW (ref 13.0–17.0)
MCH: 33.8 pg (ref 26.0–34.0)
MCHC: 31.6 g/dL (ref 30.0–36.0)
MCV: 107 fL — ABNORMAL HIGH (ref 80.0–100.0)
Platelets: 108 10*3/uL — ABNORMAL LOW (ref 150–400)
RBC: 2.87 MIL/uL — ABNORMAL LOW (ref 4.22–5.81)
RDW: 13.4 % (ref 11.5–15.5)
WBC: 7.4 10*3/uL (ref 4.0–10.5)
nRBC: 0 % (ref 0.0–0.2)

## 2019-07-16 LAB — HEPARIN LEVEL (UNFRACTIONATED): Heparin Unfractionated: 0.23 IU/mL — ABNORMAL LOW (ref 0.30–0.70)

## 2019-07-16 MED ORDER — WARFARIN SODIUM 2 MG PO TABS
2.0000 mg | ORAL_TABLET | Freq: Once | ORAL | Status: AC
  Administered 2019-07-16: 2 mg via ORAL
  Filled 2019-07-16: qty 1

## 2019-07-16 NOTE — Procedures (Signed)
   I was present at this dialysis session, have reviewed the session itself and made  appropriate changes Kelly Splinter MD [place] pager [phone]   07/16/2019, 10:38 AM

## 2019-07-16 NOTE — Progress Notes (Signed)
CARDIAC REHAB PHASE I   PRE:  Rate/Rhythm: A Fib 72  1300-1330 Pt eating lunch upon arrival recently returned from HD.  Education completed with pt including nutrition, exercise guidelines, NTG use, medications, and restrictions. Pt verbalized understanding.  Handouts given.   Pt declined walking at this time d/t eating lunch.  He would also like for his girlfriend to walk with him.  Encouraged pt to walk later today and tomorrow with girlfriend or nursing staff.   Noel Christmas, RN 07/16/2019 1:45 PM

## 2019-07-16 NOTE — Progress Notes (Addendum)
[place] for heparin + warfarin Indication: afib and mechanical AVR  Patient Measurements: Height: 5\' 10"  (177.8 cm) Weight: 86.5 kg (190 lb 9.6 oz) IBW/kg (Calculated) : 73 Heparin Dosing Weight: 85kg  Vital Signs: Temp: 100.4 F (38 C) (04/10 0403) Temp Source: Oral (04/10 0403) BP: 137/87 (04/10 0403) Pulse Rate: 105 (04/10 0403)  Labs: Recent Labs    07/14/19 0222 07/14/19 0222 07/14/19 0447 07/14/19 1050 07/14/19 2102 07/15/19 0341 07/16/19 0358  HGB 10.1*   < >  --   --   --  9.9* 9.7*  HCT 32.3*  --   --   --   --  31.5* 30.7*  PLT 111*  --   --   --   --  104* 108*  LABPROT 21.2*  --   --   --   --  17.0* 18.0*  INR 1.8*  --   --   --   --  1.4* 1.5*  HEPARINUNFRC 0.13*  --    < > 0.25* 0.50 0.55  --   CREATININE 5.80*  --   --   --   --  8.39* 10.51*   < > = values in this interval not displayed.    Estimated Creatinine Clearance: 7.5 mL/min (A) (by C-G formula based on SCr of 10.51 mg/dL (H)).  Assessment: 64 year old male on warfarin PTA for history of afib (CHADS2VASc = 3)  and mechanical aortic valve replacement. Last INR from home POCT was 2.1 on 4/5 and patient was instructed to take warfarin that night and then hold further dosing for cath 4/9. Post cath small hematoma noted. Warfarin restarted 4/9 per Cardiology, heparin infusion to resume 4/10 AM due to hematoma, which has been done.   Hematoma, H/H and platelets are stable today. Eating 100% of meals. INR 1.5 today.   Warfarin PTA dose: 1mg  daily.   Goal of Therapy:  INR goal 2.5-3.5 Heparin level 0.3 - 0.5 units/mL  Monitor platelets by anticoagulation protocol: Yes   Plan:  Continue heparin 1400 units/hr F/u 1500 HL  Stop heparin when INR is 2 or greater  Warfarin 2mg  x1 tonight Monitor daily INR, HL, CBC/plt Monitor for signs/symptoms of bleeding    ADDENDUM 15:45:  HL low at 0.23. Per RN, was not held during HD to her knowledge. Not at steady  state given just restarted this AM with no bolus and was dialyzed. Hematoma continues to be stable. Will increase modestly to previous therapeutic dose of 1500 units/hr.    Benetta Spar, PharmD, BCPS, BCCP Clinical Pharmacist  Please check AMION for all [place] phone numbers After 10:00 PM, call [place] [phone]

## 2019-07-16 NOTE — Plan of Care (Signed)
  Problem: Activity: Goal: Ability to return to baseline activity level will improve Outcome: Progressing   Problem: Cardiovascular: Goal: Ability to achieve and maintain adequate cardiovascular perfusion will improve Outcome: Completed/Met Goal: Vascular access site(s) Level 0-1 will be maintained Outcome: Completed/Met

## 2019-07-16 NOTE — Progress Notes (Signed)
[facility] Progress Note  Subjective: seen on HD, no c/o  Vitals:   07/16/19 0834 07/16/19 0900 07/16/19 0930 07/16/19 1000  BP: (!) 147/92 123/77 128/79 (!) 162/98  Pulse: (!) 106 (!) 116 76 (!) 123  Resp:      Temp:      TempSrc:      SpO2:      Weight:      Height:        Exam: Gen alert, no distress, calm No jvd or bruits Chest clear bilat to bases RRR no MRG Abd soft ntnd no mass or ascites +bs Ext no edema  Neuro is alert, Ox 3 , nf  LUA AVF+bruit    Home meds:  - coreg 6.25 bid  - asa 81/ zetia 10/ sl ntg prn/ pravachol 20  - warfarin as directed  - prilosec 20/ allopurinol 300  - sensipar 30/ renvela 5 ac tid  - prednisone 5 qd  - prn's/ vitamins/ supplements        Outpt HD:  MWF [facility]   3h 70min  450/800  85.5kg   Hep none   L AVF   Assessment/ Plan: 1. Exertional angina/ +stress test/ hx CABG / hx CAD - sp PCI of SVG-to-RCA on 4/9.  2. ESRD - usual HD MWF.  HD this am off schedule. Pt is stable.  3. HTN/ vol - no vol excess on exam, 1-2kg over 4. Atrial fib - per cardiology 5. Chron syst HF - no sign resp issues or vol overload 6. Anemia ckd - Hb 10- 11 range, follow       Rob Johnryan Sao 07/16/2019, 10:36 AM   Recent Labs  Lab 07/15/19 0341 07/16/19 0358  K 4.6 6.2*  BUN 43* 58*  CREATININE 8.39* 10.51*  CALCIUM 9.1 9.1  HGB 9.9* 9.7*   Inpatient medications: . allopurinol  300 mg Oral Daily  . aspirin EC  81 mg Oral Daily  . Chlorhexidine Gluconate Cloth  6 each Topical Q0600  . cinacalcet  30 mg Oral QODAY  . clopidogrel  75 mg Oral Q breakfast  . ezetimibe  10 mg Oral Daily  . loratadine  10 mg Oral Daily  . metoprolol tartrate  25 mg Oral BID  . multivitamin  1 tablet Oral Daily  . pantoprazole  40 mg Oral Daily  . predniSONE  5 mg Oral Q breakfast  . rosuvastatin  40 mg Oral q1800  . sevelamer carbonate  4,000 mg Oral TID WC  . sodium chloride flush  3 mL Intravenous Q12H   . warfarin  2 mg Oral ONCE-1600  . Warfarin - Pharmacist Dosing Inpatient   Does not apply q1600   . sodium chloride    . heparin 1,400 Units/hr (07/16/19 6568)   sodium chloride, acetaminophen, lidocaine-prilocaine, nitroGLYCERIN, ondansetron (ZOFRAN) IV, sodium chloride flush

## 2019-07-16 NOTE — Progress Notes (Signed)
Progress Note  Patient Name: Jeremy [name] Date of Encounter: 07/16/2019  Primary Cardiologist: Kathlyn Sacramento, MD   Subjective   No chest pain overnight. Reports palpitations have improved following stent placement. Currently undergoing HD.   Inpatient Medications    Scheduled Meds: . allopurinol  300 mg Oral Daily  . aspirin EC  81 mg Oral Daily  . Chlorhexidine Gluconate Cloth  6 each Topical Q0600  . cinacalcet  30 mg Oral QODAY  . clopidogrel  75 mg Oral Q breakfast  . ezetimibe  10 mg Oral Daily  . loratadine  10 mg Oral Daily  . metoprolol tartrate  25 mg Oral BID  . multivitamin  1 tablet Oral Daily  . pantoprazole  40 mg Oral Daily  . predniSONE  5 mg Oral Q breakfast  . rosuvastatin  40 mg Oral q1800  . sevelamer carbonate  4,000 mg Oral TID WC  . sodium chloride flush  3 mL Intravenous Q12H  . warfarin  2 mg Oral ONCE-1600  . Warfarin - Pharmacist Dosing Inpatient   Does not apply q1600   Continuous Infusions: . sodium chloride    . heparin 1,400 Units/hr (07/16/19 1308)   PRN Meds: sodium chloride, acetaminophen, lidocaine-prilocaine, nitroGLYCERIN, ondansetron (ZOFRAN) IV, sodium chloride flush   Vital Signs    Vitals:   07/15/19 1526 07/15/19 1939 07/15/19 2116 07/16/19 0403  BP: 109/60 (!) 156/90 125/72 137/87  Pulse: 81 (!) 113 (!) 103 (!) 105  Resp:  17  20  Temp: 98 F (36.7 C) 99.7 F (37.6 C)  (!) 100.4 F (38 C)  TempSrc: Oral Oral  Oral  SpO2: 100% 99%  96%  Weight:      Height:        Intake/Output Summary (Last 24 hours) at 07/16/2019 0845 Last data filed at 07/15/2019 2200 Gross per 24 hour  Intake 370 ml  Output --  Net 370 ml    Last 3 Weights 07/15/2019 07/14/2019 07/05/2019  Weight (lbs) 190 lb 9.6 oz 187 lb 6.4 oz 188 lb  Weight (kg) 86.456 kg 85.004 kg 85.276 kg      Telemetry    Atrial fibrillation, HR in 90's to 110's.  - Personally Reviewed  ECG    No new tracings. Will order repeat for this AM following stent  placement yesterday.   Physical Exam   General: Well developed, well nourished, male appearing in no acute distress. Head: Normocephalic, atraumatic.  Neck: Supple without bruits, JVD not elevated. Lungs:  Resp regular and unlabored, CTA without wheezing or rales. Heart: Irregularly irregular, S1, S2, no S3, S4, crisp mechanical valve sounds appreciated.  Abdomen: Soft, non-tender, non-distended with normoactive bowel sounds. No hepatomegaly. No rebound/guarding. No obvious abdominal masses. Extremities: No clubbing, cyanosis, or edema. Distal pedal pulses are 2+ bilaterally. Small right groin hematoma which is firm. No surrounding ecchymosis.  Neuro: Alert and oriented X 3. Moves all extremities spontaneously. Psych: Normal affect.  Labs    Chemistry Recent Labs  Lab 07/14/19 0222 07/15/19 0341 07/16/19 0358  NA 139 139 135  K 4.7 4.6 6.2*  CL 95* 94* 90*  CO2 30 29 27   GLUCOSE 148* 98 90  BUN 26* 43* 58*  CREATININE 5.80* 8.39* 10.51*  CALCIUM 8.7* 9.1 9.1  GFRNONAA 10* 6* 5*  GFRAA 11* 7* 5*  ANIONGAP 14 16* 18*     Hematology Recent Labs  Lab 07/14/19 0222 07/15/19 0341 07/16/19 0358  WBC 5.6 5.9 7.4  RBC  3.00* 2.92* 2.87*  HGB 10.1* 9.9* 9.7*  HCT 32.3* 31.5* 30.7*  MCV 107.7* 107.9* 107.0*  MCH 33.7 33.9 33.8  MCHC 31.3 31.4 31.6  RDW 13.4 13.5 13.4  PLT 111* 104* 108*    Cardiac EnzymesNo results for input(s): TROPONINI in the last 168 hours. No results for input(s): TROPIPOC in the last 168 hours.   BNPNo results for input(s): BNP, PROBNP in the last 168 hours.   DDimer No results for input(s): DDIMER in the last 168 hours.   Radiology    CARDIAC CATHETERIZATION  Result Date: 07/15/2019  Prox LAD lesion is 90% stenosed.  Mid LAD-1 lesion is 95% stenosed.  Mid LAD-2 lesion is 30% stenosed.  Mid LAD to Dist LAD lesion is 60% stenosed.  Prox RCA lesion is 40% stenosed.  Mid RCA lesion is 70% stenosed.  Dist RCA lesion is 99% stenosed with 99%  stenosed side branch in RPAV.  Ost RPDA to RPDA lesion is 95% stenosed.  SVG.  Origin lesion is 100% stenosed.  LIMA graft was visualized by angiography.  The graft exhibits no disease.  Origin to Prox Graft lesion is 99% stenosed.  Post intervention, there is a 0% residual stenosis.  A drug-eluting stent was successfully placed using a STENT RESOLUTE ONYX 4.5X15.  1.  Severe underlying two-vessel coronary artery disease including the LAD and right coronary artery.  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  Patent LIMA to LAD and occluded SVG to diagonal.  The diagonal receives collaterals from the distal LAD.  Patent SVG to right PDA with 99% ostial stenosis. 2.  Successful angioplasty and drug-eluting stent placement to SVG to right PDA. 3.  Small hematoma in the right groin noted during the diagnostic portion of the cath with suspecting oozing around the sheath due to heavy calcifications in the common femoral artery.  This improved with upsizing the sheath and manual pressure.  This was stable at the end of the case after deploying a minx closure device and holding additional 10 minutes of pressure. Recommendations: Resume heparin drip 8 hours after sheath pull after making sure that the right groin hematoma is stable. Resume warfarin tonight. The patient was started on clopidogrel and he is currently on low-dose aspirin.  I do not think he will tolerate triple therapy.  I recommend stopping aspirin before hospital discharge. For atrial fibrillation, I switch carvedilol to metoprolol tartrate 25 mg twice daily.    Cardiac Studies   Cardiac Catheterization: 07/15/2019  Prox LAD lesion is 90% stenosed.  Mid LAD-1 lesion is 95% stenosed.  Mid LAD-2 lesion is 30% stenosed.  Mid LAD to Dist LAD lesion is 60% stenosed.  Prox RCA lesion is 40% stenosed.  Mid RCA lesion is 70% stenosed.  Dist RCA lesion is 99% stenosed with 99% stenosed side  branch in RPAV.  Ost RPDA to RPDA lesion is 95% stenosed.  SVG.  Origin lesion is 100% stenosed.  LIMA graft was visualized by angiography.  The graft exhibits no disease.  Origin to Prox Graft lesion is 99% stenosed.  Post intervention, there is a 0% residual stenosis.  A drug-eluting stent was successfully placed using a STENT RESOLUTE ONYX 4.5X15.   1.  Severe underlying two-vessel coronary artery disease including the LAD and right coronary artery.  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  Patent LIMA to LAD and occluded SVG to diagonal.  The diagonal receives collaterals from the  distal LAD.  Patent SVG to right PDA with 99% ostial stenosis. 2.  Successful angioplasty and drug-eluting stent placement to SVG to right PDA. 3.  Small hematoma in the right groin noted during the diagnostic portion of the cath with suspecting oozing around the sheath due to heavy calcifications in the common femoral artery.  This improved with upsizing the sheath and manual pressure.  This was stable at the end of the case after deploying a minx closure device and holding additional 10 minutes of pressure.  Recommendations: Resume heparin drip 8 hours after sheath pull after making sure that the right groin hematoma is stable. Resume warfarin tonight. The patient was started on clopidogrel and he is currently on low-dose aspirin.  I do not think he will tolerate triple therapy.  I recommend stopping aspirin before hospital discharge. For atrial fibrillation, I switch carvedilol to metoprolol tartrate 25 mg twice daily.     Patient Profile     64 y.o. 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   Assessment & Plan    1. CAD - s/p CABG x3 in 09/2018 with LIMA-LAD, SVG-D1 and SVG-PDA. Recent NST was abnormal on 07/07/2019 and repeat catheterization was recommended. Performed by Dr. [name] on 07/15/2019 and cath showed patent LIMA-LAD with occluded SVG-D1 and diagonal was receiving collaterals from the distal LAD. He did have a patent SVG-PDA with 99% osital stenosis which was treated with angioplasty and DES placement. - he was started on ASA and Plavix along with restarting Coumadin but given that he would likely not tolerate triple therapy, it was recommended to stop ASA in 5-7 days (once INR therapeutic). Continue statin and BB therapy.   2. Groin Hematoma - he did develop a small hematoma along the right groin thought to be secondary to oozing around the sheath due to heavy calcifications.  - Heparin was restarted this AM and he was restarted on Coumadin last night. Stable on examination today with minimal tenderness. Hgb stable at 9.7.   3. Permanent Atrial Fibrillation - on Coreg prior to admission and transitioned to Lopressor 25mg  BID due to soft BP during HD. Rates have been elevated in the 90's to 110's but has not yet received AM Lopressor. Continue Coumadin for anticoagulation.   4. Mechanical Aortic Valve - s/p AVR in 2014. Per review of Dr. Antionette Char note, would plan for discharge once INR is trending upwards and after 48 hours of Heparin. Heparin restarted this AM at 0700 by review of the MAR. INR 1.5 today. Would anticipate possible discharge tomorrow or Monday pending INR trend.   5. ESRD - Nephrology following. Currently at HD.   6. Hyperkalemia - K+ 6.2 this AM. Being followed by Nephrology and currently undergoing HD.   For questions or updates, please contact [place] Please consult [email or web address] for contact  info under Cardiology/STEMI.   Signed, Erma Heritage , PA-C 8:45 AM 07/16/2019 Pager: [phone]

## 2019-07-17 LAB — CBC
HCT: 31.2 % — ABNORMAL LOW (ref 39.0–52.0)
Hemoglobin: 9.9 g/dL — ABNORMAL LOW (ref 13.0–17.0)
MCH: 34 pg (ref 26.0–34.0)
MCHC: 31.7 g/dL (ref 30.0–36.0)
MCV: 107.2 fL — ABNORMAL HIGH (ref 80.0–100.0)
Platelets: 101 10*3/uL — ABNORMAL LOW (ref 150–400)
RBC: 2.91 MIL/uL — ABNORMAL LOW (ref 4.22–5.81)
RDW: 13.7 % (ref 11.5–15.5)
WBC: 6.1 10*3/uL (ref 4.0–10.5)
nRBC: 0 % (ref 0.0–0.2)

## 2019-07-17 LAB — BASIC METABOLIC PANEL WITH GFR
Anion gap: 13 (ref 5–15)
BUN: 34 mg/dL — ABNORMAL HIGH (ref 8–23)
CO2: 28 mmol/L (ref 22–32)
Calcium: 9.1 mg/dL (ref 8.9–10.3)
Chloride: 95 mmol/L — ABNORMAL LOW (ref 98–111)
Creatinine, Ser: 7.8 mg/dL — ABNORMAL HIGH (ref 0.61–1.24)
GFR calc Af Amer: 8 mL/min — ABNORMAL LOW
GFR calc non Af Amer: 7 mL/min — ABNORMAL LOW
Glucose, Bld: 117 mg/dL — ABNORMAL HIGH (ref 70–99)
Potassium: 4.5 mmol/L (ref 3.5–5.1)
Sodium: 136 mmol/L (ref 135–145)

## 2019-07-17 LAB — PROTIME-INR
INR: 1.9 — ABNORMAL HIGH (ref 0.8–1.2)
Prothrombin Time: 21.7 seconds — ABNORMAL HIGH (ref 11.4–15.2)

## 2019-07-17 LAB — HEPARIN LEVEL (UNFRACTIONATED): Heparin Unfractionated: 0.41 IU/mL (ref 0.30–0.70)

## 2019-07-17 MED ORDER — CHLORHEXIDINE GLUCONATE CLOTH 2 % EX PADS: 6.0000 | MEDICATED_PAD | Freq: Every day | CUTANEOUS | Status: DC

## 2019-07-17 MED ORDER — ALUM & MAG HYDROXIDE-SIMETH 200-200-20 MG/5ML PO SUSP
15.0000 mL | Freq: Once | ORAL | Status: AC
  Administered 2019-07-17: 15 mL via ORAL
  Filled 2019-07-17: qty 30

## 2019-07-17 MED ORDER — WARFARIN SODIUM 1 MG PO TABS
1.0000 mg | ORAL_TABLET | Freq: Once | ORAL | Status: AC
  Administered 2019-07-17: 1 mg via ORAL
  Filled 2019-07-17: qty 1

## 2019-07-17 NOTE — Progress Notes (Signed)
Progress Note  Patient Name: Jeremy [name] Date of Encounter: 07/17/2019  Primary Cardiologist: Jeremy Sacramento, MD   Subjective   Feeling well.  No complaints this morning.  Inpatient Medications    Scheduled Meds: . allopurinol  300 mg Oral Daily  . aspirin EC  81 mg Oral Daily  . Chlorhexidine Gluconate Cloth  6 each Topical Q0600  . cinacalcet  30 mg Oral QODAY  . clopidogrel  75 mg Oral Q breakfast  . ezetimibe  10 mg Oral Daily  . loratadine  10 mg Oral Daily  . metoprolol tartrate  25 mg Oral BID  . multivitamin  1 tablet Oral Daily  . pantoprazole  40 mg Oral Daily  . predniSONE  5 mg Oral Q breakfast  . rosuvastatin  40 mg Oral q1800  . sevelamer carbonate  4,000 mg Oral TID WC  . sodium chloride flush  3 mL Intravenous Q12H  . warfarin  1 mg Oral ONCE-1600  . Warfarin - Pharmacist Dosing Inpatient   Does not apply q1600   Continuous Infusions: . sodium chloride    . heparin 1,500 Units/hr (07/16/19 1555)   PRN Meds: sodium chloride, acetaminophen, lidocaine-prilocaine, nitroGLYCERIN, ondansetron (ZOFRAN) IV, sodium chloride flush   Vital Signs    Vitals:   07/16/19 1927 07/16/19 2017 07/17/19 0537 07/17/19 0843  BP: 111/85 131/80 115/60 132/64  Pulse: (!) 120 (!) 120 (!) 108 97  Resp: 18 17 20    Temp: 98.6 F (37 C) 99.7 F (37.6 C) 98 F (36.7 C)   TempSrc: Oral Oral Oral   SpO2: 96% 99% 100%   Weight:   85.9 kg   Height:        Intake/Output Summary (Last 24 hours) at 07/17/2019 1028 Last data filed at 07/17/2019 0600 Gross per 24 hour  Intake 931.84 ml  Output 1000 ml  Net -68.16 ml   Last 3 Weights 07/17/2019 07/16/2019 07/16/2019  Weight (lbs) 189 lb 6.4 oz 189 lb 9.5 oz 193 lb 12.6 oz  Weight (kg) 85.911 kg 86 kg 87.9 kg      Telemetry    Atrial flutter.  Rate less than 100 bpm. - Personally Reviewed  ECG    07/16/2019: Atrial flutter.  Ventricular rate 105 bpm.  Right bundle branch block. - Personally Reviewed  Physical Exam    VS:  BP 132/64   Pulse 97   Temp 98 F (36.7 C) (Oral)   Resp 20   Ht 5\' 10"  (1.778 m)   Wt 85.9 kg   SpO2 100%   BMI 27.18 kg/m  , BMI Body mass index is 27.18 kg/m. GENERAL:  Well appearing.  No acute distress HEENT: Pupils equal round and reactive, fundi not visualized, oral mucosa unremarkable NECK:  No jugular venous distention, waveform within normal limits, carotid upstroke brisk and symmetric, no bruits LUNGS:  Clear to auscultation bilaterally HEART: Irregularly irregular.  PMI not displaced or sustained,S1 within normal limits.  Mechanical S2, no S3, no S4, no clicks, no rubs, II/VI systolic murmurs ABD:  Flat, positive bowel sounds normal in frequency in pitch, no bruits, no rebound, no guarding, no midline pulsatile mass, no hepatomegaly, no splenomegaly EXT:  2 plus pulses throughout, no edema, no cyanosis no clubbing SKIN:  No rashes no nodules NEURO:  Cranial nerves II through XII grossly intact, motor grossly intact throughout PSYCH:  Cognitively intact, oriented to person place and time   Labs    High Sensitivity Troponin:  No results  for input(s): TROPONINIHS in the last 720 hours.    Chemistry Recent Labs  Lab 07/15/19 0341 07/16/19 0358 07/17/19 0259  NA 139 135 136  K 4.6 6.2* 4.5  CL 94* 90* 95*  CO2 29 27 28   GLUCOSE 98 90 117*  BUN 43* 58* 34*  CREATININE 8.39* 10.51* 7.80*  CALCIUM 9.1 9.1 9.1  GFRNONAA 6* 5* 7*  GFRAA 7* 5* 8*  ANIONGAP 16* 18* 13     Hematology Recent Labs  Lab 07/15/19 0341 07/16/19 0358 07/17/19 0259  WBC 5.9 7.4 6.1  RBC 2.92* 2.87* 2.91*  HGB 9.9* 9.7* 9.9*  HCT 31.5* 30.7* 31.2*  MCV 107.9* 107.0* 107.2*  MCH 33.9 33.8 34.0  MCHC 31.4 31.6 31.7  RDW 13.5 13.4 13.7  PLT 104* 108* 101*    BNPNo results for input(s): BNP, PROBNP in the last 168 hours.   DDimer No results for input(s): DDIMER in the last 168 hours.   Radiology    No results found.  Cardiac Studies   Cardiac Catheterization:  07/15/2019  Prox LAD lesion is 90% stenosed.  Mid LAD-1 lesion is 95% stenosed.  Mid LAD-2 lesion is 30% stenosed.  Mid LAD to Dist LAD lesion is 60% stenosed.  Prox RCA lesion is 40% stenosed.  Mid RCA lesion is 70% stenosed.  Dist RCA lesion is 99% stenosed with 99% stenosed side branch in RPAV.  Ost RPDA to RPDA lesion is 95% stenosed.  SVG.  Origin lesion is 100% stenosed.  LIMA graft was visualized by angiography.  The graft exhibits no disease.  Origin to Prox Graft lesion is 99% stenosed.  Post intervention, there is a 0% residual stenosis.  A drug-eluting stent was successfully placed using a STENT RESOLUTE ONYX 4.5X15.  1. Severe underlying two-vessel coronary artery disease including the LAD and right coronary artery. 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 Patent LIMA to LAD and occluded SVG to diagonal. The diagonal receives collaterals from the distal LAD. Patent SVG to right PDA with 99% ostial stenosis. 2. Successful angioplasty and drug-eluting stent placement to SVG to right PDA. 3. Small hematoma in the right groin noted during the diagnostic portion of the cath with suspecting oozing around the sheath due to heavy calcifications in the common femoral artery. This improved with upsizing the sheath and manual pressure. This was stable at the end of the case after deploying a minx closure device and holding additional 10 minutes of pressure.  Recommendations: Resume heparin drip 8 hours after sheath pull after making sure that the right groin hematoma is stable. Resume warfarin tonight. The patient was started on clopidogrel and he is currently on low-dose aspirin. I do not think he will tolerate triple therapy. I recommend stopping aspirin before hospital discharge. For atrial fibrillation, I switch carvedilol to metoprolol tartrate 25 mg twice daily.    Patient Profile     Jeremy [name] is a  28M with ESRD on HD,  CAD s/p CABG (LIMA-LAD, SVG-D1 and SVG-PDA), mechanical AVR, chronic systolic and diastolic heart failure, recent COVID-19, hypertension, and hyperlipidemia admitted for cardiac catheterization for angina and dyspnea.  He had successful DES to the SVG-->RPDA  Assessment & Plan    # CAD: #Unstable angina: Status post CABG x3 09/2018.  He was admitted with angina and dyspnea.  Cath showed a patent LIMA to the LAD and occluded vein graft to D1.  His vein graft to the PDA had 99% ostial stenosis which  was successfully treated with angioplasty and DES.  He is doing well.  He was admitted for bridging with heparin.  Continue aspirin and clopidogrel.  The plan is for him to stop aspirin after 5 to 7 days once his INR is therapeutic.  Clopidogrel, Zetia,  metoprolol, and rosuvastatin.  LDL was 73 on 06/2019.  When considering a PCSK9 inhibitor as an outpatient.  #Groin hematoma: Resolved.  H/H stable.  # Mechanical AVR:   Stable.  Warfarin was resumed and INR is up to 1.9 today.  Given that he has atrial fibrillation the INR goal is 2.5-3.5.  Per Dr. Burt Knack, okay to discharge after 48 hours of heparin and uptrending INR.  # ESRD on HD:  He is due for HD tomorrow.  #Essential hypertension: BP stable on metoprolol.  #Persistent atrial flutter: He remains in atrial flutter.  Rates are better controlled today.  Continue metoprolol and warfarin.  Bridging with heparin as above.  For questions or updates, please contact [place] Please consult [email or web address] for contact info under        Signed, Skeet Latch, MD  07/17/2019, 10:28 AM

## 2019-07-17 NOTE — Consult Note (Signed)
WOC Nurse Consult Note: Reason for Consult: Full thickness wound to right lateral foot x 3 months duration. Patient and his wounds are followed closely by Dr. Cleda Mccreedy, Podiatry, and Dr. Lucky Cowboy (Vascular) in [place].  Wound type: arterial insufficiency vs neuropathic Pressure Injury POA: N/A Measurement: 0.2cm round with yellow wound bed Wound bed: See above Drainage (amount, consistency, odor) scant light yellow Periwound: intact.  5th digit with shiny appearance and taut appearance due to adhesive strip bandage placement. Dressing procedure/placement/frequency:Patient has tried collagenase (Santyl) and silver sulfadiazine (Silvadene) to the wound. As recently as last week Dr. Cleda Mccreedy has encourage patient to try a saline moistened tip of a gauze 2x2 over the wound. Patient reports that he is likely to be discharged tomorrow and would like to resume his home regimen upon arrival to home. He reports that he is to see Dr. Lucky Cowboy late next week as they are hopeful the cath he had here will improve blood flow to the extremity making an angiogram unnecessary.  As he is well versed in his self care, I will place a conservative care order to continue a silicone foam dressing until he returns home.  [place] nursing team will not follow, but will remain available to this patient, the nursing and medical teams.  Please re-consult if needed. Thanks, Maudie Flakes, MSN, RN, [place], Arther Abbott  Pager# [phone]

## 2019-07-17 NOTE — Progress Notes (Signed)
[facility] Progress Note  Subjective: seen in room, no c/o's  Vitals:   07/16/19 1927 07/16/19 2017 07/17/19 0537 07/17/19 0843  BP: 111/85 131/80 115/60 132/64  Pulse: (!) 120 (!) 120 (!) 108 97  Resp: 18 17 20    Temp: 98.6 F (37 C) 99.7 F (37.6 C) 98 F (36.7 C)   TempSrc: Oral Oral Oral   SpO2: 96% 99% 100%   Weight:   85.9 kg   Height:        Exam: Gen alert, no distress, calm No jvd or bruits Chest clear bilat to bases RRR no MRG Abd soft ntnd no mass or ascites +bs Ext no edema  Neuro is alert, Ox 3 , nf  LUA AVF+bruit    Home meds:  - coreg 6.25 bid  - asa 81/ zetia 10/ sl ntg prn/ pravachol 20  - warfarin as directed  - prilosec 20/ allopurinol 300  - sensipar 30/ renvela 5 ac tid  - prednisone 5 qd  - prn's/ vitamins/ supplements        Outpt HD:  MWF [facility]   3h 8min  450/800  85.5kg   Hep none   L AVF   Assessment/ Plan: 1. Exertional angina/ +stress test/ hx CABG / hx CAD - sp PCI of SVG-to-RCA on 4/9.  2. ESRD - usual HD MWF.  HD tomorrow am.  3. HTN/ vol - no vol excess on exam, at dry wt 4. Atrial fib - per cardiology, on IV hep and coumadin, INR rising 1.9 today 5. Chron syst HF - no sign resp issues or vol overload 6. Anemia ckd - Hb 10- 11 range, follow       Rob Shaune Malacara 07/17/2019, 1:46 PM   Recent Labs  Lab 07/16/19 0358 07/17/19 0259  K 6.2* 4.5  BUN 58* 34*  CREATININE 10.51* 7.80*  CALCIUM 9.1 9.1  HGB 9.7* 9.9*   Inpatient medications: . allopurinol  300 mg Oral Daily  . aspirin EC  81 mg Oral Daily  . Chlorhexidine Gluconate Cloth  6 each Topical Q0600  . cinacalcet  30 mg Oral QODAY  . clopidogrel  75 mg Oral Q breakfast  . ezetimibe  10 mg Oral Daily  . loratadine  10 mg Oral Daily  . metoprolol tartrate  25 mg Oral BID  . multivitamin  1 tablet Oral Daily  . pantoprazole  40 mg Oral Daily  . predniSONE  5 mg Oral Q breakfast  . rosuvastatin  40 mg Oral  q1800  . sevelamer carbonate  4,000 mg Oral TID WC  . sodium chloride flush  3 mL Intravenous Q12H  . warfarin  1 mg Oral ONCE-1600  . Warfarin - Pharmacist Dosing Inpatient   Does not apply q1600   . sodium chloride    . heparin 1,500 Units/hr (07/16/19 1555)   sodium chloride, acetaminophen, lidocaine-prilocaine, nitroGLYCERIN, ondansetron (ZOFRAN) IV, sodium chloride flush

## 2019-07-17 NOTE — Progress Notes (Addendum)
[place] for heparin + warfarin Indication: afib and mechanical AVR  Patient Measurements: Height: 5\' 10"  (177.8 cm) Weight: 85.9 kg (189 lb 6.4 oz) IBW/kg (Calculated) : 73 Heparin Dosing Weight: 85kg  Vital Signs: Temp: 98 F (36.7 C) (04/11 0537) Temp Source: Oral (04/11 0537) BP: 115/60 (04/11 0537) Pulse Rate: 108 (04/11 0537)  Labs: Recent Labs    07/15/19 0341 07/15/19 0341 07/16/19 0358 07/16/19 1516 07/17/19 0259  HGB 9.9*   < > 9.7*  --  9.9*  HCT 31.5*  --  30.7*  --  31.2*  PLT 104*  --  108*  --  101*  LABPROT 17.0*  --  18.0*  --  21.7*  INR 1.4*  --  1.5*  --  1.9*  HEPARINUNFRC 0.55  --   --  0.23* 0.41  CREATININE 8.39*  --  10.51*  --  7.80*   < > = values in this interval not displayed.    Estimated Creatinine Clearance: 10.1 mL/min (A) (by C-G formula based on SCr of 7.8 mg/dL (H)).  Assessment: 64 year old male on warfarin PTA for history of afib (CHADS2VASc = 3)  and mechanical aortic valve replacement. Last INR from home POCT was 2.1 on 4/5 and patient was instructed to take warfarin that night and then hold further dosing for cath 4/9. Post cath small hematoma noted. Warfarin restarted 4/9 per Cardiology, heparin infusion to resume 4/10 AM due to hematoma, which has been done.    HL 0.41 at goal, continue rate. H/H , plt stable. Per RN, hematoma continues to be stable.   INR up to 1.9. Review of outpatient INRs shows more subtherapeutic levels than therapeutic levels  on 1mg  daily given patient's higher INR goal. Would suggest increasing weekly dose if continues with higher INR goal.   Warfarin PTA dose: 1mg  daily.   Goal of Therapy:  INR goal 2.5-3.5 Heparin level 0.3 - 0.5 units/mL  Monitor platelets by anticoagulation protocol: Yes   Plan:  Continue heparin 1500 units/hr Stop heparin when INR is 2 or greater  Warfarin 1mg  x1 tonight For discharge, suggest 1.5mg  on MWF and 1mg  TuThSaSun  Monitor  daily INR, HL, CBC/plt Monitor for signs/symptoms of bleeding    Benetta Spar, PharmD, BCPS, BCCP Clinical Pharmacist  Please check AMION for all [place] phone numbers After 10:00 PM, call [place]

## 2019-07-18 LAB — HEPARIN LEVEL (UNFRACTIONATED): Heparin Unfractionated: 0.39 IU/mL (ref 0.30–0.70)

## 2019-07-18 LAB — BASIC METABOLIC PANEL
Anion gap: 15 (ref 5–15)
BUN: 51 mg/dL — ABNORMAL HIGH (ref 8–23)
CO2: 28 mmol/L (ref 22–32)
Calcium: 8.9 mg/dL (ref 8.9–10.3)
Chloride: 93 mmol/L — ABNORMAL LOW (ref 98–111)
Creatinine, Ser: 10.71 mg/dL — ABNORMAL HIGH (ref 0.61–1.24)
GFR calc Af Amer: 5 mL/min — ABNORMAL LOW (ref >60)
GFR calc non Af Amer: 5 mL/min — ABNORMAL LOW (ref >60)
Glucose, Bld: 108 mg/dL — ABNORMAL HIGH (ref 70–99)
Potassium: 4.8 mmol/L (ref 3.5–5.1)
Sodium: 136 mmol/L (ref 135–145)

## 2019-07-18 LAB — PROTIME-INR
INR: 2.1 — ABNORMAL HIGH (ref 0.8–1.2)
Prothrombin Time: 23.1 seconds — ABNORMAL HIGH (ref 11.4–15.2)

## 2019-07-18 MED ORDER — ROSUVASTATIN CALCIUM 40 MG PO TABS: 40.0000 mg | ORAL_TABLET | Freq: Every day | ORAL | 11 refills | Status: DC

## 2019-07-18 MED ORDER — PANTOPRAZOLE SODIUM 40 MG PO TBEC: 40.0000 mg | DELAYED_RELEASE_TABLET | Freq: Every day | ORAL | 11 refills | Status: DC

## 2019-07-18 MED ORDER — METOPROLOL TARTRATE 25 MG PO TABS: 25.0000 mg | ORAL_TABLET | Freq: Two times a day (BID) | ORAL | 11 refills | Status: DC

## 2019-07-18 MED ORDER — CLOPIDOGREL BISULFATE 75 MG PO TABS: 75.0000 mg | ORAL_TABLET | Freq: Every day | ORAL | 11 refills | Status: DC

## 2019-07-18 MED ORDER — WARFARIN SODIUM 1 MG PO TABS: 1.0000 mg | ORAL_TABLET | ORAL | 1 refills | Status: DC

## 2019-07-18 MED FILL — METOPROLOL TARTRATE 25 MG T: 25 | 30 days supply | Qty: 60 | Fill #0

## 2019-07-18 MED FILL — ROSUVASTATIN CALCIUM 40 MG: 40 | 30 days supply | Qty: 30 | Fill #0

## 2019-07-18 MED FILL — CLOPIDOGREL 75 MG TABLET: 75 | 30 days supply | Qty: 30 | Fill #0

## 2019-07-18 MED FILL — PANTOPRAZOLE SOD DR 40 MG T: 40 | 30 days supply | Qty: 30 | Fill #0

## 2019-07-18 NOTE — TOC Transition Note (Signed)
Transition of Care [facility]) - CM/SW Discharge Note   Patient Details  Name: Jeremy [name] MRN: [id number] Date of Birth: 1956-01-09  Transition of Care [facility]) CM/SW Contact:  Zenon Mayo, RN Phone Number: 07/18/2019, 9:31 AM   Clinical Narrative:    For dc today, no needs.   Final next level of care: Home/Self Care Barriers to Discharge: No Barriers Identified   Patient Goals and CMS Choice     Choice offered to / list presented to : NA  Discharge Placement                       Discharge Plan and Services In-house Referral: NA Discharge Planning Services: CM Consult Post Acute Care Choice: NA          DME Arranged: N/A DME Agency: NA       HH Arranged: NA HH Agency: NA        Social Determinants of Health (SDOH) Interventions     Readmission Risk Interventions Readmission Risk Prevention Plan 07/14/2019 03/18/2019  Transportation Screening Complete Complete  HRI or Home Care Consult Complete -  Social Work Consult for [place] Planning/Counseling Complete -  Palliative Care Screening Not Applicable -  Medication Review [employer] photographer) Complete Complete  PCP or Specialist appointment within 3-5 days of discharge - Complete  SW Recovery Care/Counseling Consult - Complete  [place] - Not Applicable  Some recent data might be hidden

## 2019-07-18 NOTE — Discharge Summary (Addendum)
Discharge Summary    Patient ID: Jeremy [name] MRN: [id number]; DOB: 1956/03/02  Admit date: 07/13/2019 Discharge date: 07/18/2019  Primary Care Provider: Leone Haven, MD  Primary Cardiologist: Kathlyn Sacramento, MD   Discharge Diagnoses    Active Problems:   ESRD (end stage renal disease) ([place])   H/O mitral valve replacement with mechanical valve   Unstable angina ([place])    Diagnostic Studies/Procedures    CORONARY STENT INTERVENTION  LEFT HEART CATH AND CORS/GRAFTS ANGIOGRAPHY  Conclusion    Prox LAD lesion is 90% stenosed. Mid LAD-1 lesion is 95% stenosed. Mid LAD-2 lesion is 30% stenosed. Mid LAD to Dist LAD lesion is 60% stenosed. Prox RCA lesion is 40% stenosed. Mid RCA lesion is 70% stenosed. Dist RCA lesion is 99% stenosed with 99% stenosed side branch in RPAV. Ost RPDA to RPDA lesion is 95% stenosed. SVG. Origin lesion is 100% stenosed. LIMA graft was visualized by angiography. The graft exhibits no disease. Origin to Prox Graft lesion is 99% stenosed. Post intervention, there is a 0% residual stenosis. A drug-eluting stent was successfully placed using a STENT RESOLUTE ONYX 4.5X15.   1.  Severe underlying two-vessel coronary artery disease including the LAD and right coronary artery.  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  Patent LIMA to LAD and occluded SVG to diagonal.  The diagonal receives collaterals from the distal LAD.  Patent SVG to right PDA with 99% ostial stenosis. 2.  Successful angioplasty and drug-eluting stent placement to SVG to right PDA. 3.  Small hematoma in the right groin noted during the diagnostic portion of the cath with suspecting oozing around the sheath due to heavy calcifications in the common femoral artery.  This improved with upsizing the sheath and manual pressure.  This was stable at the end of the case after deploying a minx closure device and holding additional 10 minutes of  pressure.   Recommendations: Resume heparin drip 8 hours after sheath pull after making sure that the right groin hematoma is stable. Resume warfarin tonight. The patient was started on clopidogrel and he is currently on low-dose aspirin.  I do not think he will tolerate triple therapy.  I recommend stopping aspirin before hospital discharge. For atrial fibrillation, I switch carvedilol to metoprolol tartrate 25 mg twice daily.   Diagnostic Dominance: Right  Intervention     History of Present Illness     Jeremy [name] is a 64 y.o. 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   Cath 06/2018 with severe two-vessel disease to LAD/RCA. Recommended for CABG. Underwent CABG x4 09/2018 (LIMA to LAD, SVG to D1, SVG to PDA). Echo 05/2019 LVEF 40-45%. Metoprolol switched to Coreg due to reduced EF for optimization of GDMT. Office visit 06-24-19 reporting chest pain relieved by nitroglycerin, subsequent Lexiscan Myoview 07/07/19 was high risk due to significant scar and estimated EF 27%. Full report below. Recommendation to proceed with LHC due to concern for diagonal and rPDA graft patency. Given mechanical AV, he needs heparin bridging prior to and following the procedure.  Hospital Course     Consultants: None  1. CAD: - Cath as above with  patent LIMA to the LAD and occluded vein graft to D1.  His vein graft to the PDA had 99% ostial stenosis which was successfully treated with angioplasty and DES.  On aspirin and clopidogrel. The plan is for him to stop aspirin after 5 to 7 days once his INR is therapeutic.  INR 2.1 at discharge >> Given ASA today>> stop.  No recurrent chest pain.  - Continue Clopidogrel, Zetia, Crestor, metoprolol (Consider switch to long acting as outpatient - reports fluctuating BP with dialysis), and rosuvastatin.  LDL was 73 on 06/2019.   2. Groin hematoma: - Resolved.  H/H stable.   3.  Mechanical AVR:   - Bridged with heparin. Warfarin was resumed and INR is up to 2.1 today.  Given that he has atrial fibrillation the INR goal is 2.5-3.5.  Per Dr. Burt Knack, okay to discharge after 48 hours of heparin and uptrending INR.>> will stop heparin. Send home today. Coumadin adjusted by pharmacy team. Outpatient INR check 07/20/19.   4. ESRD on HD:  - He will get outpatient dialysis today.    5. Essential hypertension: - BP stable on metoprolol.   6. Persistent atrial fibrillation/flutter: - HR in 90-100s on  metoprolol 25mg  BID (may consider changing to long acting as outpatient given low EF).  - Anticoagulation as above.   7. HLD - 06/21/2019: Cholesterol 136; HDL 33; LDL Cholesterol 73; Triglycerides 150; VLDL 30  - Statin intolerance - Started Crestor started this admission if unable to tolerate consider PCSK9 inhibitor as outpatient. On Zetia.   Did the patient have an acute coronary syndrome (MI, NSTEMI, STEMI, etc) this admission?:  Yes                               AHA/ACC Clinical Performance & Quality Measures: Aspirin prescribed? - No - stopped due to need of anticoagulation  ADP Receptor Inhibitor (Plavix/Clopidogrel, Brilinta/Ticagrelor or Effient/Prasugrel) prescribed (includes medically managed patients)? - Yes Beta Blocker prescribed? - Yes High Intensity Statin (Lipitor 40-80mg  or Crestor 20-40mg ) prescribed? - No - statin intolerance  EF assessed during THIS hospitalization? - No - ESRD For EF <40%, was ACEI/ARB prescribed? - No- due to ESRD For EF <40%, Aldosterone Antagonist (Spironolactone or Eplerenone) prescribed? - No - Reason:  due to ESRD Cardiac Rehab Phase II ordered (Included Medically managed  Patients)? - Yes    Discharge Vitals Blood pressure 131/79, pulse 96, temperature 98.5 F (36.9 C), temperature source Oral, resp. rate 20, height 5\' 10"  (1.778 m), weight 86.7 kg, SpO2 98 %.  Filed Weights   07/16/19 1135 07/17/19 0537 07/18/19 0519  Weight: 86 kg 85.9 kg 86.7 kg   Physical Exam  Constitutional: He is oriented to person, place, and time and well-developed, well-nourished, and in no distress.  HENT:  Head: Normocephalic and atraumatic.  Eyes: Pupils are equal, round, and reactive to light. EOM are normal.  Cardiovascular: Normal rate and regular rhythm.  R groin cath site with ecchymosis  Pulmonary/Chest: Effort normal and breath sounds normal.  Abdominal: Soft. Bowel sounds are normal.  Musculoskeletal:        General: Normal range of motion.     Cervical back: Normal range of motion and neck supple.  Neurological: He is alert and oriented to person, place, and time.  Skin: Skin is warm and dry.  Psychiatric: Affect normal.   Labs & Radiologic Studies    CBC Recent Labs    07/16/19  8841 07/17/19 0259  WBC 7.4 6.1  HGB 9.7* 9.9*  HCT 30.7* 31.2*  MCV 107.0* 107.2*  PLT 108* 660*   Basic Metabolic Panel Recent Labs    07/17/19 0259 07/18/19 0349  NA 136 136  K 4.5 4.8  CL 95* 93*  CO2 28 28  GLUCOSE 117* 108*  BUN 34* 51*  CREATININE 7.80* 10.71*  CALCIUM 9.1 8.9   _____________  CARDIAC CATHETERIZATION  Result Date: 07/15/2019  Prox LAD lesion is 90% stenosed.  Mid LAD-1 lesion is 95% stenosed.  Mid LAD-2 lesion is 30% stenosed.  Mid LAD to Dist LAD lesion is 60% stenosed.  Prox RCA lesion is 40% stenosed.  Mid RCA lesion is 70% stenosed.  Dist RCA lesion is 99% stenosed with 99% stenosed side branch in RPAV.  Ost RPDA to RPDA lesion is 95% stenosed.  SVG.  Origin lesion is 100% stenosed.  LIMA graft was visualized by angiography.  The graft exhibits no disease.  Origin to Prox Graft lesion is 99% stenosed.  Post intervention,  there is a 0% residual stenosis.  A drug-eluting stent was successfully placed using a STENT RESOLUTE ONYX 4.5X15.  1.  Severe underlying two-vessel coronary artery disease including the LAD and right coronary artery.  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  Patent LIMA to LAD and occluded SVG to diagonal.  The diagonal receives collaterals from the distal LAD.  Patent SVG to right PDA with 99% ostial stenosis. 2.  Successful angioplasty and drug-eluting stent placement to SVG to right PDA. 3.  Small hematoma in the right groin noted during the diagnostic portion of the cath with suspecting oozing around the sheath due to heavy calcifications in the common femoral artery.  This improved with upsizing the sheath and manual pressure.  This was stable at the end of the case after deploying a minx closure device and holding additional 10 minutes of pressure. Recommendations: Resume heparin drip 8 hours after sheath pull after making sure that the right groin hematoma is stable. Resume warfarin tonight. The patient was started on clopidogrel and he is currently on low-dose aspirin.  I do not think he will tolerate triple therapy.  I recommend stopping aspirin before hospital discharge. For atrial fibrillation, I switch carvedilol to metoprolol tartrate 25 mg twice daily.   NM Myocar Multi W/Spect W/Wall Motion / EF  Result Date: 07/07/2019 Pharmacological myocardial perfusion imaging study with large region of fixed defect in the inferior, inferolateral wall consistent with prior infarct Moderate-sized region of ischemia noted in the mid anterior wall, mid lateral wall extending to apical lateral region Hypokinesis of the inferior and apical region EF estimated at 27% No EKG changes concerning for ischemia at peak stress or in recovery. Resting EKG with atrial fibrillation, right bundle branch block High risk scan, given large region of scar, severely depressed  ejection fraction Signed, Esmond Plants, MD, Ph.D [facility]   Disposition   Pt is being discharged home today in good condition.  Follow-up Plans & Appointments    Follow-up Information     Wellington Hampshire, MD. Go on 07/26/2019.   Specialty: Cardiology Why: @3 :40pm for hospital follow up  Contact information: [place] [id number] [place]. Go on 07/20/2019.   Specialty: Cardiology Why: @10 :45am for INR check and medication management  Contact information: [address] [place]  [place] [phone]          Discharge Instructions     Amb Referral to Cardiac Rehabilitation   Complete by: As directed    Diagnosis: Coronary Stents   After initial evaluation and assessments completed: Virtual Based Care may be provided alone or in conjunction with Phase 2 Cardiac Rehab based on patient barriers.: Yes   Diet - low sodium heart healthy   Complete by: As directed    Discharge instructions   Complete by: As directed    No driving for 3 days. . No lifting over 5 lbs for 1 week. No sexual activity for 1 week. Keep procedure site clean & dry. If you notice increased pain, swelling, bleeding or pus, call/return!  You may shower, but no soaking baths/hot tubs/pools for 1 week.   Increase activity slowly   Complete by: As directed        Discharge Medications   Allergies as of 07/18/2019       Reactions   Statins    Other reaction(s): Arthralgias (intolerance), Myalgias (intolerance) Pt taking pravastatin    Sertraline Rash        Medication List     STOP taking these medications    aspirin 81 MG EC tablet   carvedilol 6.25 MG tablet Commonly known as: COREG   omeprazole 20 MG capsule Commonly known as: PRILOSEC Replaced by: pantoprazole 40 MG tablet   pravastatin 20 MG tablet Commonly known as: PRAVACHOL       TAKE these medications     acetaminophen 500 MG tablet Commonly known as: TYLENOL Take 1,000 mg 2 (two) times daily as needed by mouth for moderate pain.   allopurinol 300 MG tablet Commonly known as: ZYLOPRIM Take 300 mg by mouth daily.   cinacalcet 30 MG tablet Commonly known as: SENSIPAR Take 30 mg by mouth every other day.   clopidogrel 75 MG tablet Commonly known as: PLAVIX Take 1 tablet (75 mg total) by mouth daily with breakfast. Start taking on: July 19, 2019   ezetimibe 10 MG tablet Commonly known as: ZETIA Take 1 tablet (10 mg total) by mouth daily.   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   loratadine 10 MG tablet Commonly known as: CLARITIN Take 10 mg by mouth daily.   metoprolol tartrate 25 MG tablet Commonly known as: LOPRESSOR Take 1 tablet (25 mg total) by mouth 2 (two) times daily.   multivitamin Tabs tablet Take 1 tablet by mouth daily.   nitroGLYCERIN 0.4 MG SL tablet Commonly known as: NITROSTAT Place 1 tablet (0.4 mg total) under the tongue every 5 (five) minutes as needed for chest pain.   pantoprazole 40 MG tablet Commonly known as: PROTONIX Take 1 tablet (40 mg total) by mouth daily. Start taking on: July 19, 2019 Replaces: omeprazole 20 MG capsule   predniSONE 5 MG tablet Commonly known as: DELTASONE Take 5 mg by mouth daily with breakfast.   rosuvastatin 40 MG tablet Commonly known as: CRESTOR Take 1 tablet (40 mg total) by mouth daily at 6 PM.   sevelamer carbonate 800 MG tablet Commonly known as: RENVELA Take 4,000 mg by mouth 3 (three) times daily with meals. 5 tabs per meal   warfarin 1 MG tablet Commonly known as: COUMADIN Take as directed. If you are unsure how to take this medication, talk to your nurse or doctor. Original instructions: Take 1-1.5 tablets (1-1.5 mg total) by mouth See admin instructions.  Take 1mg  on Sun, Tues, Thur and Sat Take 1.5mg  on Mon, Wed, Fri What  changed:  how much to take additional instructions           Outstanding Labs/Studies   PT/INR on 07/20/19  Duration of Discharge Encounter   Greater than 30 minutes including physician time.  SignedCrista Luria [place], PA 07/18/2019, 8:52 AM   Agree with note by Robbie Lis PA-C  Mr. Mccorkle  is stable for discharge status post coronary intervention awaiting his INR to become therapeutic.  He does have a mechanical aortic valve.  INR was greater than 2.  He is clinically stable for discharge home today.   Lorretta Harp, M.D., [place], [facility], [place] [address] [place], [place]  [id number]  [phone] 07/18/2019 12:37 PM

## 2019-07-18 NOTE — Progress Notes (Signed)
CARDIAC REHAB PHASE I   PRE:  Rate/Rhythm: 100 afib    BP: sitting 131/79    SaO2:   MODE:  Ambulation: 1000+ ft   POST:  Rate/Rhythm: 120 ? aflutter    BP: sitting 137/90     SaO2: 100 RA  Pt in bed earlier, talking to staff, HR 100 afib. When I returned, pt had walked independently and then started packing his belongings, HR now 118-120 consistently ? Aflutter. Pt sts he was weak walking but did walk a long distance. HR did not decrease with rest.   Discussed Plavix importance, increasing exercise, NTG and CRPII. He will be eager to get back to CRPII. [place], ACSM 07/18/2019 9:35 AM

## 2019-07-18 NOTE — Plan of Care (Signed)
  Problem: Activity: Goal: Ability to return to baseline activity level will improve Outcome: Progressing   Problem: Health Behavior/Discharge Planning: Goal: Ability to safely manage health-related needs after discharge will improve Outcome: Completed/Met

## 2019-07-19 ENCOUNTER — Ambulatory Visit: Payer: Medicare Other | Admitting: Cardiovascular Disease

## 2019-07-20 ENCOUNTER — Encounter: Payer: Self-pay | Admitting: Cardiovascular Disease

## 2019-07-20 ENCOUNTER — Ambulatory Visit (INDEPENDENT_AMBULATORY_CARE_PROVIDER_SITE_OTHER): Payer: Medicare Other

## 2019-07-20 DIAGNOSIS — Z952 Presence of prosthetic heart valve: Secondary | ICD-10-CM | POA: Diagnosis not present

## 2019-07-20 DIAGNOSIS — I4821 Permanent atrial fibrillation: Secondary | ICD-10-CM

## 2019-07-20 DIAGNOSIS — Z5181 Encounter for therapeutic drug level monitoring: Secondary | ICD-10-CM | POA: Diagnosis not present

## 2019-07-20 LAB — LAB REPORT - SCANNED: INR: 2.1

## 2019-07-20 LAB — POCT INR: INR: 2.1 (ref 2.0–3.0)

## 2019-07-20 NOTE — Patient Instructions (Signed)
Sent MyChart message to pt advising him to START NEW DOSAGE of warfarin 1 mg every day EXCEPT 1.5 mg on [place]. Recheck INR in 1 week.

## 2019-07-21 ENCOUNTER — Ambulatory Visit (INDEPENDENT_AMBULATORY_CARE_PROVIDER_SITE_OTHER): Payer: Medicare Other | Admitting: Pharmacist

## 2019-07-21 DIAGNOSIS — I4821 Permanent atrial fibrillation: Secondary | ICD-10-CM | POA: Diagnosis not present

## 2019-07-21 DIAGNOSIS — Z952 Presence of prosthetic heart valve: Secondary | ICD-10-CM

## 2019-07-21 LAB — POCT INR: INR: 2.5 (ref 2.0–3.0)

## 2019-07-21 NOTE — Telephone Encounter (Signed)
Patient called today to report value of 2.5 as he believes yesterday value was not received.  Please call.

## 2019-07-25 ENCOUNTER — Telehealth: Payer: Self-pay | Admitting: Cardiovascular Disease

## 2019-07-25 ENCOUNTER — Ambulatory Visit (INDEPENDENT_AMBULATORY_CARE_PROVIDER_SITE_OTHER): Payer: Medicare Other

## 2019-07-25 DIAGNOSIS — Z952 Presence of prosthetic heart valve: Secondary | ICD-10-CM

## 2019-07-25 DIAGNOSIS — Z5181 Encounter for therapeutic drug level monitoring: Secondary | ICD-10-CM

## 2019-07-25 DIAGNOSIS — I4821 Permanent atrial fibrillation: Secondary | ICD-10-CM | POA: Diagnosis not present

## 2019-07-25 LAB — POCT INR: INR: 2.7 (ref 2.0–3.0)

## 2019-07-25 NOTE — Patient Instructions (Signed)
Sent MyChart message to pt advising him to continue dose of 1mg  daily since he recently held 3 days.  Recheck INR in 1 week.

## 2019-07-25 NOTE — Telephone Encounter (Signed)
Patient is calling with INR reading.  2.7

## 2019-07-25 NOTE — Telephone Encounter (Signed)
Please see anti-coag note for today. 

## 2019-07-26 ENCOUNTER — Ambulatory Visit (INDEPENDENT_AMBULATORY_CARE_PROVIDER_SITE_OTHER): Payer: Medicare Other | Admitting: Cardiovascular Disease

## 2019-07-26 ENCOUNTER — Other Ambulatory Visit: Payer: Self-pay

## 2019-07-26 VITALS — BP 134/68 | HR 98 | Ht 70.0 in | Wt 190.5 lb

## 2019-07-26 DIAGNOSIS — I5022 Chronic systolic (congestive) heart failure: Secondary | ICD-10-CM

## 2019-07-26 DIAGNOSIS — I2510 Atherosclerotic heart disease of native coronary artery without angina pectoris: Secondary | ICD-10-CM | POA: Diagnosis not present

## 2019-07-26 DIAGNOSIS — I7025 Atherosclerosis of native arteries of other extremities with ulceration: Secondary | ICD-10-CM

## 2019-07-26 DIAGNOSIS — I482 Chronic atrial fibrillation, unspecified: Secondary | ICD-10-CM | POA: Diagnosis not present

## 2019-07-26 MED ORDER — METOPROLOL TARTRATE 25 MG PO TABS: 25.0000 mg | ORAL_TABLET | Freq: Three times a day (TID) | ORAL | 5 refills | Status: DC

## 2019-07-26 NOTE — Progress Notes (Signed)
Cardiology Office Note   Date:  07/26/2019   ID:  Jeremy [name], DOB 03-22-1956, MRN [id number]  PCP:  Leone Haven, MD  Cardiologist:   Kathlyn Sacramento, MD   Chief Complaint  Patient presents with  . office visit    Pt has no complaints. Meds verbally reviewed w/ pt.      History of Present Illness: Jeremy [name] is a 64 y.o. male who is here today for follow-up visit regarding coronary artery disease.   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  He had an echocardiogram done in January 2020 which showed a drop in ejection fraction from normal to 35%, moderately reduced RV function, moderate biatrial enlargement, [employer] Jude mechanical aortic valve with mean gradient of 21 mmHg, mild mitral regurgitation and mild pulmonary hypertension.  He was on peritoneal dialysis before and was switched to hemodialysis in August 2019 due to ineffective fluid removal.  The patient had worsening angina in early 2020 with abnormal nuclear stress test.  He underwent cardiac catheterization which showed severe heavily calcified two-vessel coronary artery disease affecting the LAD and right coronary artery.  He underwent CABG by Dr. Roxan Hockey with LIMA to LAD, SVG to diagonal and SVG to right PDA. He had improvement in symptoms but then started having issues with A. fib with RVR.  He was on amiodarone for rate control at some point but then was discontinued given chronicity and improvement in ventricular rate with metoprolol.  Subsequently, he was switched to carvedilol but ventricular rate  worsened.  He had recent recurrent angina.  Nuclear stress test in April showed large fixed defect in the inferior and inferolateral wall consistent with an infarct as well as inferior ischemia and ejection fraction of 27%.  Due to these findings, I proceeded with cardiac catheterization which showed significant underlying two-vessel coronary artery disease with patent LIMA to LAD and occluded SVG to diagonal.  Diagonal was getting collaterals from the LAD.  SVG to right PDA was patent but had 99% ostial stenosis.  I performed successful angioplasty and drug-eluting stent placement to SVG to right PDA.  He had small hematoma after the procedure that did not require intervention.  Carvedilol was switched to metoprolol for better rate control. He was discharged home on Plavix without aspirin given that he is on anticoagulation with warfarin.  He has been doing extremely well with resolution of anginal symptoms.  Ventricular rate is more controlled than before but still not optimal.    Past Medical History:  Diagnosis Date  . Abnormal stress test 03/19/2018  . 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 Aortic stenosis    a. 2014 s/p mech AVR ([facility])-->chronic coumadin; b. 08/2018 Echo: Mild AS/AI. Nl fxn/gradient.  . Arthritis   . Atrial fibrillation ([place])   . CAD (coronary artery disease)    a. 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 02/2018 MV [facility]): large inf, infsept, inflat infarct w/ min rev, EF 30%; c. 06/2018 Cath: LM min irregs, LAD 60p, 95/30/2m, LCX min irregs, RCA  40p, 6m, 90d, RPAV 90, RPDA 95ost; c. 09/2018 CABGx3 (LIMA->LAD, VG->D1, VG->PDA).  . Chronic anticoagulation 06/2012   a. Coumadin in the setting of mech AVR and afib.  Marland Kitchen COVID-19 03/15/2019  . CPAP (continuous positive airway pressure) dependence   . Dialysis patient [facility]) 04/07/2006   Patient started HD around 1996,  had 1st transplant LLQ around 1998 until 2007  when they found renal cell cancer in transplant and both native kidneys, all were removed > went back on HD until 2nd transplant RLQ in 2014 which lasted 3 yrs; it was embolized for suspected acute rejection. Is currently on HD MWF in [place] w/ [facility] nephrology.  . ESRD (end stage renal disease) ([place])    a. CKD 2/2 to IgA nephropathy (dx age 50); b. 1999 s/p Kidney transplant; c. 2014 Bilat nephrectomy (transplanted kidney resected) in the setting of renal cell carcinoma; d. PD until 11/2017-->HD.  Marland Kitchen Heart murmur   . HFrEF (heart failure with reduced ejection fraction) ([place])    a. 07/2016 Echo: EF 55-60%; b. 02/2018 MV: EF 30%; c. 04/2018 Echo: EF 35%; d. 08/2018 Echo: EF 30-35%. Glob HK w/o rwma. Mildly reduced RV fxn. Mod to sev dil LA. Nl AoV fxn.  Marland Kitchen Hx of colonoscopy    2009 by Dr. Laural Golden. Normal except for small submucosal lipoma. No evidence of polyps or colitis.  . Hyperlipidemia   . Hypertension   . Hypogonadism in male 07/25/2015  . Ischemic cardiomyopathy    a. 07/2016 Echo: EF 55-60%; b. 02/2018 MV: EF 30%; c. 04/2018 Echo: EF 35%; d. 08/2018 Echo: EF 30-35%.  . OSA (obstructive sleep apnea)    No CPAP  . Permanent atrial fibrillation ([facility])    a. CHA2DS2VASc = 3-->Coumadin (also mech AVR); b. 06/2018 Amio started 2/2 elevated rates.  . Renal cell cancer ([place])   . Renal cell carcinoma ([place]) 08/04/2016    Past Surgical History:  Procedure Laterality Date  . AORTIC VALVE REPLACEMENT  3 /11 /2014   At [place]    . CAPD INSERTION N/A 02/19/2017   Procedure: LAPAROSCOPIC INSERTION CONTINUOUS AMBULATORY PERITONEAL DIALYSIS  (CAPD) CATHETER;  Surgeon: Algernon Huxley, MD;  Location: [facility];  Service: General;  Laterality: N/A;  . CORONARY ANGIOPLASTY WITH STENT PLACEMENT    . CORONARY ARTERY BYPASS GRAFT N/A 09/16/2018   Procedure: CORONARY ARTERY BYPASS GRAFT times three      with left internal mammary artery and endoharvest of right leg  greater saphenous vein;  Surgeon: Melrose Nakayama, MD;  Location: [place];  Service: Open Heart Surgery;  Laterality: N/A;  . CORONARY STENT INTERVENTION N/A 07/15/2019   Procedure: CORONARY STENT INTERVENTION;  Surgeon: Wellington Hampshire, MD;  Location: [place] [facility];  Service: Cardiovascular;  Laterality: N/A;  . DG AV DIALYSIS  SHUNT ACCESS EXIST*L* OR     left arm  . EPIGASTRIC HERNIA REPAIR N/A 02/19/2017   Procedure: HERNIA REPAIR EPIGASTRIC ADULT;  Surgeon: Robert Bellow, MD;  Location: [facility];  Service: General;  Laterality: N/A;  . EYE SURGERY     bilateral cataract  . HERNIA REPAIR     UMBILICAL HERNIA  . HERNIA REPAIR  02/19/2017  . INNER EAR SURGERY     20 years ago  . KIDNEY TRANSPLANT    . LEFT HEART CATH AND CORS/GRAFTS ANGIOGRAPHY N/A 07/15/2019   Procedure: LEFT HEART CATH AND CORS/GRAFTS ANGIOGRAPHY;  Surgeon: Wellington Hampshire, MD;  Location: [place] [facility];  Service: Cardiovascular;  Laterality: N/A;  . Peritoneal Dialysis access placement  02/19/2017  . REMOVAL OF A DIALYSIS CATHETER N/A 11/23/2017   Procedure: REMOVAL OF A PERITONEAL DIALYSIS CATHETER;  Surgeon: Algernon Huxley, MD;  Location: [facility];  Service: Vascular;  Laterality: N/A;  . RIGHT/LEFT HEART CATH AND CORONARY ANGIOGRAPHY Bilateral 06/07/2018   Procedure: RIGHT/LEFT HEART CATH AND CORONARY ANGIOGRAPHY;  Surgeon: Wellington Hampshire, MD;  Location: [place] [facility];  Service: Cardiovascular;  Laterality: Bilateral;  . TEE WITHOUT CARDIOVERSION N/A 07/21/2016   Procedure: TRANSESOPHAGEAL ECHOCARDIOGRAM (TEE);  Surgeon: Wellington Hampshire, MD;  Location: [facility];  Service: Cardiovascular;  Laterality: N/A;  . TEE WITHOUT CARDIOVERSION N/A 09/16/2018   Procedure: TRANSESOPHAGEAL ECHOCARDIOGRAM (TEE);  Surgeon: Melrose Nakayama, MD;  Location: [place];  Service: Open Heart Surgery;  Laterality: N/A;     Current Outpatient Medications  Medication Sig Dispense Refill  . acetaminophen  (TYLENOL) 500 MG tablet Take 1,000 mg 2 (two) times daily as needed by mouth for moderate pain.    Marland Kitchen allopurinol (ZYLOPRIM) 300 MG tablet Take 300 mg by mouth daily.  5  . cinacalcet (SENSIPAR) 30 MG tablet Take 30 mg by mouth every other day.     . clopidogrel (PLAVIX) 75 MG tablet Take 1 tablet (75 mg total) by mouth daily with breakfast. 30 tablet 11  . ezetimibe (ZETIA) 10 MG tablet Take 1 tablet (10 mg total) by mouth daily. 90 tablet 3  . lidocaine-prilocaine (EMLA) cream Apply 1 application topically as needed (for fistula).    . loratadine (CLARITIN) 10 MG tablet Take 10 mg by mouth daily.    . metoprolol tartrate (LOPRESSOR) 25 MG tablet Take 1 tablet (25 mg total) by mouth 2 (two) times daily. 60 tablet 11  . multivitamin (RENA-VIT) TABS tablet Take 1 tablet by mouth daily.    . nitroGLYCERIN (NITROSTAT) 0.4 MG SL tablet Place 1 tablet (0.4 mg total) under the tongue every 5 (five) minutes as needed for chest pain. 25 tablet 3  . pantoprazole (PROTONIX) 40 MG tablet Take 1 tablet (40 mg total) by mouth daily. 30 tablet 11  . predniSONE (DELTASONE) 5 MG tablet Take 5 mg by mouth daily with breakfast.     . rosuvastatin (CRESTOR) 40 MG tablet Take 1 tablet (40 mg total) by mouth daily at 6 PM. 30 tablet 11  . sevelamer carbonate (RENVELA) 800 MG tablet Take 4,000 mg by mouth 3 (three) times daily with meals. 5 tabs per meal     . warfarin (COUMADIN) 1 MG tablet Take 1-1.5 tablets (1-1.5 mg total) by mouth See admin instructions. Take 1mg  on Sun, Tues, Thur and Sat Take 1.5mg  on Mon, Wed, Fri 180 tablet 1   No current facility-administered medications for this visit.    Allergies:   Statins and Sertraline    Social History:  The patient  reports that he has never smoked. He has never used smokeless tobacco. He reports that he does not drink alcohol or use drugs.   Family History:  The patient's family history includes Cancer in his mother; Diabetes in his father; Heart disease in his  father.    ROS:  Please see the history of present illness.   Otherwise, review of systems are positive for none.   All other systems are reviewed and negative.    PHYSICAL EXAM: VS:  BP 134/68 (BP Location: Right Arm, Patient Position: Sitting, Cuff Size: Normal)  Pulse 98   Ht 5\' 10"  (1.778 m)   Wt 190 lb 8 oz (86.4 kg)   SpO2 98% Comment: unable obtain  BMI 27.33 kg/m  , BMI Body mass index is 27.33 kg/m. GEN: Well nourished, well developed, in no acute distress  HEENT: normal  Neck: Mild JVD, no carotid bruits, or masses Cardiac: Irregularly irregular; normal mechanical heart sounds with 1 out of 6 systolic murmur at the aortic area . Respiratory:  clear to auscultation bilaterally, normal work of breathing GI: soft, nontender, nondistended, + BS MS: no deformity or atrophy  Skin: warm and dry, no rash Neuro:  Strength and sensation are intact Psych: euthymic mood, full affect Vascular: The patient has a fistula in the left arm.   There is ecchymosis in the right groin with small hematoma.  EKG:  EKG is ordered today. The ekg ordered today demonstrates atrial fibrillation with ventricular rate of 98 bpm.  Right bundle branch block.  T wave changes suggestive of inferior ischemia.   Recent Labs: 03/15/2019: B Natriuretic Peptide 1,821.0 03/17/2019: Magnesium 2.6 06/21/2019: ALT 19 07/17/2019: Hemoglobin 9.9; Platelets 101 07/18/2019: BUN 51; Creatinine, Ser 10.71; Potassium 4.8; Sodium 136    Lipid Panel    Component Value Date/Time   CHOL 136 06/21/2019 0932   CHOL 179 04/26/2019 0938   TRIG 150 (H) 06/21/2019 0932   HDL 33 (L) 06/21/2019 0932   HDL 38 (L) 04/26/2019 0938   CHOLHDL 4.1 06/21/2019 0932   VLDL 30 06/21/2019 0932   LDLCALC 73 06/21/2019 0932   LDLCALC 111 (H) 04/26/2019 0938   LDLDIRECT 61.0 08/11/2017 1447      Wt Readings from Last 3 Encounters:  07/26/19 190 lb 8 oz (86.4 kg)  07/18/19 191 lb 1.6 oz (86.7 kg)  07/05/19 188 lb (85.3 kg)         No flowsheet data found.    ASSESSMENT AND PLAN:  1.  Coronary artery disease involving native coronary arteries without angina: He feels significantly better after recent PCI and drug-eluting stent placement to SVG to right PDA.  Continue clopidogrel without aspirin given that he is on warfarin.   2.  Chronic systolic heart failure: Most likely due to ischemic cardiomyopathy.  Most recent echo in February showed an EF of 40 to 45%.    He is not on an ACE inhibitor, ARB or other vasodilators due to low blood pressure.   3.  Chronic atrial fibrillation: On long-term anticoagulation with warfarin given mechanical aortic valve.  I elected to increase metoprolol to 25 mg 3 times daily.  4.  Hyperlipidemia: He is tolerating high-dose rosuvastatin and Zetia.  5.  Mechanical aortic valve replacement: Continue long-term anticoagulation with warfarin.  6.  End-stage renal disease on hemodialysis Monday Wednesday and Friday.  Disposition:   FU with me in 3 months  Signed,  Kathlyn Sacramento, MD  07/26/2019 3:42 PM    [facility]

## 2019-07-26 NOTE — Patient Instructions (Signed)
Medication Instructions:  Your physician has recommended you make the following change in your medication:   INCREASE Metoprolol to 25 mg three times daily. An Rx has been sent to your pharmacy.  *If you need a refill on your cardiac medications before your next appointment, please call your pharmacy*   Lab Work: None ordered If you have labs (blood work) drawn today and your tests are completely normal, you will receive your results only by: Marland Kitchen MyChart Message (if you have MyChart) OR . A paper copy in the mail If you have any lab test that is abnormal or we need to change your treatment, we will call you to review the results.   Testing/Procedures: None ordered   Follow-Up: At [facility], you and your health needs are our priority.  As part of our continuing mission to provide you with exceptional heart care, we have created designated Provider Care Teams.  These Care Teams include your primary Cardiologist (physician) and Advanced Practice Providers (APPs -  Physician Assistants and Nurse Practitioners) who all work together to provide you with the care you need, when you need it.  We recommend signing up for the patient portal called "MyChart".  Sign up information is provided on this After Visit Summary.  MyChart is used to connect with patients for Virtual Visits (Telemedicine).  Patients are able to view lab/test results, encounter notes, upcoming appointments, etc.  Non-urgent messages can be sent to your provider as well.   To learn more about what you can do with MyChart, go to [email or web address].    Your next appointment:   3 month(s)  The format for your next appointment:   In Person  Provider:    You may see Kathlyn Sacramento, MD or one of the following Advanced Practice Providers on your designated Care Team:    Murray Hodgkins, NP  Christell Faith, PA-C  Marrianne Mood, PA-C    Other Instructions N/A

## 2019-07-28 ENCOUNTER — Other Ambulatory Visit: Payer: Self-pay

## 2019-07-28 ENCOUNTER — Encounter: Payer: Medicare Other | Attending: Cardiovascular Disease

## 2019-07-28 DIAGNOSIS — Z48812 Encounter for surgical aftercare following surgery on the circulatory system: Secondary | ICD-10-CM | POA: Insufficient documentation

## 2019-07-28 DIAGNOSIS — Z955 Presence of coronary angioplasty implant and graft: Secondary | ICD-10-CM

## 2019-07-28 DIAGNOSIS — I132 Hypertensive heart and chronic kidney disease with heart failure and with stage 5 chronic kidney disease, or end stage renal disease: Secondary | ICD-10-CM | POA: Insufficient documentation

## 2019-07-28 DIAGNOSIS — I2510 Atherosclerotic heart disease of native coronary artery without angina pectoris: Secondary | ICD-10-CM | POA: Insufficient documentation

## 2019-07-28 DIAGNOSIS — N186 End stage renal disease: Secondary | ICD-10-CM | POA: Insufficient documentation

## 2019-07-28 NOTE — Progress Notes (Signed)
Virtual Orientation performed. Patient informed when to come in for RD and EP orientation. Diagnosis can be found in [facility] 08/02/2019.

## 2019-07-29 MED ORDER — CLOPIDOGREL BISULFATE 75 MG PO TABS: 75.0000 mg | ORAL_TABLET | Freq: Every day | ORAL | 3 refills | Status: AC

## 2019-07-29 MED ORDER — PANTOPRAZOLE SODIUM 40 MG PO TBEC: 40.0000 mg | DELAYED_RELEASE_TABLET | Freq: Every day | ORAL | 3 refills | Status: AC

## 2019-07-29 MED ORDER — METOPROLOL TARTRATE 25 MG PO TABS: 25.0000 mg | ORAL_TABLET | Freq: Three times a day (TID) | ORAL | 3 refills | Status: DC

## 2019-07-29 MED ORDER — ROSUVASTATIN CALCIUM 40 MG PO TABS: 40.0000 mg | ORAL_TABLET | Freq: Every day | ORAL | 3 refills | Status: DC

## 2019-08-01 ENCOUNTER — Ambulatory Visit (INDEPENDENT_AMBULATORY_CARE_PROVIDER_SITE_OTHER): Payer: Medicare Other

## 2019-08-01 ENCOUNTER — Encounter: Payer: Self-pay | Admitting: Cardiovascular Disease

## 2019-08-01 ENCOUNTER — Telehealth: Payer: Self-pay | Admitting: Cardiovascular Disease

## 2019-08-01 DIAGNOSIS — Z952 Presence of prosthetic heart valve: Secondary | ICD-10-CM | POA: Diagnosis not present

## 2019-08-01 DIAGNOSIS — Z5181 Encounter for therapeutic drug level monitoring: Secondary | ICD-10-CM

## 2019-08-01 DIAGNOSIS — I4821 Permanent atrial fibrillation: Secondary | ICD-10-CM

## 2019-08-01 LAB — POCT INR: INR: 1.8 — AB (ref 2.0–3.0)

## 2019-08-01 NOTE — Telephone Encounter (Signed)
Please see anti-coag note for today. 

## 2019-08-01 NOTE — Patient Instructions (Signed)
Spoke w/ pt and advised him to take 2 mg warfarin tonight, then continue dose of 1mg  daily. Recheck INR in 1 week.

## 2019-08-01 NOTE — Telephone Encounter (Signed)
Per patient self test inr result 1.8    Please route to the Coumadin Clinic Pool

## 2019-08-02 ENCOUNTER — Other Ambulatory Visit: Payer: Self-pay

## 2019-08-02 ENCOUNTER — Encounter: Payer: Medicare Other | Admitting: *Deleted

## 2019-08-02 VITALS — Ht 69.9 in | Wt 189.0 lb

## 2019-08-02 DIAGNOSIS — I132 Hypertensive heart and chronic kidney disease with heart failure and with stage 5 chronic kidney disease, or end stage renal disease: Secondary | ICD-10-CM | POA: Diagnosis not present

## 2019-08-02 DIAGNOSIS — I2510 Atherosclerotic heart disease of native coronary artery without angina pectoris: Secondary | ICD-10-CM | POA: Diagnosis not present

## 2019-08-02 DIAGNOSIS — Z48812 Encounter for surgical aftercare following surgery on the circulatory system: Secondary | ICD-10-CM | POA: Diagnosis present

## 2019-08-02 DIAGNOSIS — Z955 Presence of coronary angioplasty implant and graft: Secondary | ICD-10-CM

## 2019-08-02 DIAGNOSIS — N186 End stage renal disease: Secondary | ICD-10-CM | POA: Diagnosis not present

## 2019-08-02 NOTE — Progress Notes (Signed)
Cardiac Individual Treatment Plan  Patient Details  Name: Jeremy [name] MRN: [id number] Date of Birth: 09-21-1955 Referring Provider:     Cardiac Rehab from 08/02/2019 in [facility] Cardiac and Pulmonary Rehab  Referring Provider  Kathlyn Sacramento MD      Initial Encounter Date:    Cardiac Rehab from 08/02/2019 in [facility] Cardiac and Pulmonary Rehab  Date  08/02/19      Visit Diagnosis: Status post coronary artery stent placement  Patient's Home Medications on Admission:  Current Outpatient Medications:  .  acetaminophen (TYLENOL) 500 MG tablet, Take 1,000 mg 2 (two) times daily as needed by mouth for moderate pain., Disp: , Rfl:  .  allopurinol (ZYLOPRIM) 300 MG tablet, Take 300 mg by mouth daily., Disp: , Rfl: 5 .  cinacalcet (SENSIPAR) 30 MG tablet, Take 30 mg by mouth every other day. , Disp: , Rfl:  .  clopidogrel (PLAVIX) 75 MG tablet, Take 1 tablet (75 mg total) by mouth daily with breakfast., Disp: 90 tablet, Rfl: 3 .  ezetimibe (ZETIA) 10 MG tablet, Take 1 tablet (10 mg total) by mouth daily., Disp: 90 tablet, Rfl: 3 .  lidocaine-prilocaine (EMLA) cream, Apply 1 application topically as needed (for fistula)., Disp: , Rfl:  .  loratadine (CLARITIN) 10 MG tablet, Take 10 mg by mouth daily., Disp: , Rfl:  .  metoprolol tartrate (LOPRESSOR) 25 MG tablet, Take 1 tablet (25 mg total) by mouth in the morning, at noon, and at bedtime., Disp: 270 tablet, Rfl: 3 .  multivitamin (RENA-VIT) TABS tablet, Take 1 tablet by mouth daily., Disp: , Rfl:  .  nitroGLYCERIN (NITROSTAT) 0.4 MG SL tablet, Place 1 tablet (0.4 mg total) under the tongue every 5 (five) minutes as needed for chest pain., Disp: 25 tablet, Rfl: 3 .  pantoprazole (PROTONIX) 40 MG tablet, Take 1 tablet (40 mg total) by mouth daily., Disp: 90 tablet, Rfl: 3 .  predniSONE (DELTASONE) 5 MG tablet, Take 5 mg by mouth daily with breakfast. , Disp: , Rfl:  .  rosuvastatin (CRESTOR) 40 MG tablet, Take 1 tablet (40 mg total) by mouth daily at 6  PM., Disp: 90 tablet, Rfl: 3 .  sevelamer carbonate (RENVELA) 800 MG tablet, Take 4,000 mg by mouth 3 (three) times daily with meals. 5 tabs per meal , Disp: , Rfl:  .  warfarin (COUMADIN) 1 MG tablet, Take 1-1.5 tablets (1-1.5 mg total) by mouth See admin instructions. Take 16m on Sun, Tues, Thur and Sat Take 1.51mon Mon, Wed, Fri, Disp: 180 tablet, Rfl: 1  Past Medical History: Past Medical History:  Diagnosis Date  . Abnormal stress test 03/19/2018  . 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 Aortic stenosis    a. 2014 s/p mech AVR ([facility])-->chronic coumadin; b. 08/2018 Echo: Mild AS/AI. Nl fxn/gradient.  . Arthritis   . Atrial fibrillation ([facility]  . CAD (coronary artery disease)    a. 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 [facility]2018 [facility], inflat infarct w/ min rev, EF 30%; c. 06/2018 Cath: LM min irregs, LAD 60p, 95/30/48m, LCX min irregs, RCA 40p, 65m21md, RPAV 90, RPDA 95ost; c. 09/2018 CABGx3 (LIMA->LAD, VG->D1, VG->PDA).  . Chronic anticoagulation 06/2012   a. Coumadin in the setting of mech AVR and afib.  . COMarland KitchenID-19 03/15/2019  . CPAP (continuous positive airway pressure) dependence   [facility]   Patient  started HD around 1996,  had 1st transplant LLQ around 1998 until 2007 when they found renal cell cancer in transplant and both native kidneys, all were removed > went back on HD until 2nd transplant RLQ in 2014 which lasted 3 yrs; it was embolized for suspected acute rejection. Is currently on HD MWF in [place] w/ [facility] nephrology.  . ESRD (end stage renal disease) ([place])    a. 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 1999 s/p Kidney transplant; c. 2014 Bilat nephrectomy (transplanted kidney resected) in the setting of renal cell carcinoma; d. PD until 11/2017-->HD.  Marland Kitchen Heart murmur   . HFrEF (heart failure with reduced ejection fraction) ([place])    a. 07/2016 Echo: EF  55-60%; b. 02/2018 MV: EF 30%; c. 04/2018 Echo: EF 35%; d. 08/2018 Echo: EF 30-35%. Glob HK w/o rwma. Mildly reduced RV fxn. Mod to sev dil LA. Nl AoV fxn.  Marland Kitchen Hx of colonoscopy    2009 by Dr. Laural Golden. Normal except for small submucosal lipoma. No evidence of polyps or colitis.  . Hyperlipidemia   . Hypertension   . Hypogonadism in male 07/25/2015  . Ischemic cardiomyopathy    a. 07/2016 Echo: EF 55-60%; b. 02/2018 MV: EF 30%; c. 04/2018 Echo: EF 35%; d. 08/2018 Echo: EF 30-35%.  . OSA (obstructive sleep apnea)    No CPAP  . Permanent atrial fibrillation (HCC)    a. CHA2DS2VASc = 3-->Coumadin (also mech AVR); b. 06/2018 Amio started 2/2 elevated rates.  . Renal cell cancer ([place])   . Renal cell carcinoma ([place]) 08/04/2016    Tobacco Use: Social History   Tobacco Use  Smoking Status Never Smoker  Smokeless Tobacco Never Used    Labs: Recent Review Flowsheet Data    Labs for ITP Cardiac and Pulmonary Rehab Latest Ref Rng & Units 09/16/2018 09/16/2018 03/15/2019 04/26/2019 06/21/2019   Cholestrol 0 - 200 mg/dL - - - 179 136   LDLCALC 0 - 99 mg/dL - - - 111(H) 73   LDLDIRECT mg/dL - - - - -   HDL >40 mg/dL - - - 38(L) 33(L)   Trlycerides <150 mg/dL - - 179(H) 167(H) 150(H)   Hemoglobin A1c 4.8 - 5.6 % - - - - -   PHART 7.350 - 7.450 7.329(L) - - - -   PCO2ART 32.0 - 48.0 mmHg 48.1(H) - - - -   HCO3 20.0 - 28.0 mmol/L 25.2 - - - -   TCO2 22 - 32 mmol/L 27 24 - - -   ACIDBASEDEF 0.0 - 2.0 mmol/L 1.0 - - - -   O2SAT % 99.0 - - - -       Exercise Target Goals: Exercise Program Goal: Individual exercise prescription set using results from initial 6 min walk test and THRR while considering  patient's activity barriers and safety.   Exercise Prescription Goal: Initial exercise prescription builds to 30-45 minutes a day of aerobic activity, 2-3 days per week.  Home exercise guidelines will be given to patient during program as part of exercise prescription that the participant will  acknowledge.   Education: Aerobic Exercise & Resistance Training: - Gives group verbal and written instruction on the various components of exercise. Focuses on aerobic and resistive training programs and the benefits of this training and how to safely progress through these programs..   Education: Exercise & Equipment Safety: - Individual verbal instruction and demonstration of equipment use and safety with use of the equipment.   Cardiac  Rehab from 07/28/2019 in [facility] Cardiac and Pulmonary Rehab  Date  07/28/19  Educator  [facility]  Instruction Review Code  1- Verbalizes Understanding      Education: Exercise Physiology & General Exercise Guidelines: - Group verbal and written instruction with models to review the exercise physiology of the cardiovascular system and associated critical values. Provides general exercise guidelines with specific guidelines to those with heart or lung disease.    Education: Flexibility, Balance, Mind/Body Relaxation: Provides group verbal/written instruction on the benefits of flexibility and balance training, including mind/body exercise modes such as yoga, pilates and tai chi.  Demonstration and skill practice provided.   Cardiac Rehab from 02/24/2019 in [facility] Cardiac and Pulmonary Rehab  Date  02/24/19  Educator  as  Instruction Review Code  1- Verbalizes Understanding      Activity Barriers & Risk Stratification: Activity Barriers & Cardiac Risk Stratification - 08/02/19 0956      Activity Barriers & Cardiac Risk Stratification   Activity Barriers  Joint Problems;Muscular Weakness;Deconditioning;Shortness of Breath;Balance Concerns    Cardiac Risk Stratification  High       6 Minute Walk: 6 Minute Walk    Row Name 08/02/19 0943         6 Minute Walk   Phase  Initial     Distance  1265 feet     Walk Time  6 minutes     # of Rest Breaks  0     MPH  2.4     METS  3.68     RPE  17     VO2 Peak  12.85     Symptoms  Yes (comment)     Comments   joints achy     Resting HR  88 bpm     Resting BP  128/64     Resting Oxygen Saturation   99 %     Exercise Oxygen Saturation  during 6 min walk  100 %     Max Ex. HR  119 bpm     Max Ex. BP  148/60     2 Minute Post BP  122/64        Oxygen Initial Assessment:   Oxygen Re-Evaluation:   Oxygen Discharge (Final Oxygen Re-Evaluation):   Initial Exercise Prescription: Initial Exercise Prescription - 08/02/19 0900      Date of Initial Exercise RX and Referring Provider   Date  08/02/19    Referring Provider  Kathlyn Sacramento MD      Treadmill   MPH  2.3    Grade  1    Minutes  15    METs  3.08      NuStep   Level  3    SPM  80    Minutes  15    METs  3      REL-XR   Level  3    Speed  50    Minutes  15    METs  3      T5 Nustep   Level  3    SPM  80    Minutes  15    METs  3      Biostep-RELP   Level  3    SPM  50    Minutes  15    METs  3      Prescription Details   Frequency (times per week)  2    Duration  Progress to 30 minutes of continuous aerobic without signs/symptoms  of physical distress      Intensity   THRR 40-80% of Max Heartrate  116-144    Ratings of Perceived Exertion  11-13    Perceived Dyspnea  0-4      Progression   Progression  Continue to progress workloads to maintain intensity without signs/symptoms of physical distress.      Resistance Training   Training Prescription  Yes    Weight  3 lb    Reps  10-15       Perform Capillary Blood Glucose checks as needed.  Exercise Prescription Changes: Exercise Prescription Changes    Row Name 08/02/19 0900             Response to Exercise   Blood Pressure (Admit)  128/64       Blood Pressure (Exercise)  148/60       Blood Pressure (Exit)  122/64       Heart Rate (Admit)  88 bpm       Heart Rate (Exercise)  119 bpm       Heart Rate (Exit)  95 bpm       Oxygen Saturation (Admit)  99 %       Oxygen Saturation (Exercise)  100 %       Rating of Perceived Exertion  (Exercise)  17       Symptoms  joints achy       Comments  walk test results          Exercise Comments:   Exercise Goals and Review: Exercise Goals    Row Name 08/02/19 1005             Exercise Goals   Increase Physical Activity  Yes       Intervention  Provide advice, education, support and counseling about physical activity/exercise needs.;Develop an individualized exercise prescription for aerobic and resistive training based on initial evaluation findings, risk stratification, comorbidities and participant's personal goals.       Expected Outcomes  Short Term: Attend rehab on a regular basis to increase amount of physical activity.;Long Term: Add in home exercise to make exercise part of routine and to increase amount of physical activity.;Long Term: Exercising regularly at least 3-5 days a week.       Increase Strength and Stamina  Yes       Intervention  Provide advice, education, support and counseling about physical activity/exercise needs.;Develop an individualized exercise prescription for aerobic and resistive training based on initial evaluation findings, risk stratification, comorbidities and participant's personal goals.       Expected Outcomes  Short Term: Increase workloads from initial exercise prescription for resistance, speed, and METs.;Short Term: Perform resistance training exercises routinely during rehab and add in resistance training at home;Long Term: Improve cardiorespiratory fitness, muscular endurance and strength as measured by increased METs and functional capacity (6MWT)       Able to understand and use rate of perceived exertion (RPE) scale  Yes       Intervention  Provide education and explanation on how to use RPE scale       Expected Outcomes  Short Term: Able to use RPE daily in rehab to express subjective intensity level;Long Term:  Able to use RPE to guide intensity level when exercising independently       Able to understand and use Dyspnea scale   Yes       Intervention  Provide education and explanation on how to use Dyspnea scale  Expected Outcomes  Short Term: Able to use Dyspnea scale daily in rehab to express subjective sense of shortness of breath during exertion;Long Term: Able to use Dyspnea scale to guide intensity level when exercising independently       Knowledge and understanding of Target Heart Rate Range (THRR)  Yes       Intervention  Provide education and explanation of THRR including how the numbers were predicted and where they are located for reference       Expected Outcomes  Short Term: Able to state/look up THRR;Short Term: Able to use daily as guideline for intensity in rehab;Long Term: Able to use THRR to govern intensity when exercising independently       Able to check pulse independently  Yes       Intervention  Provide education and demonstration on how to check pulse in carotid and radial arteries.;Review the importance of being able to check your own pulse for safety during independent exercise       Expected Outcomes  Short Term: Able to explain why pulse checking is important during independent exercise;Long Term: Able to check pulse independently and accurately       Understanding of Exercise Prescription  Yes       Intervention  Provide education, explanation, and written materials on patient's individual exercise prescription       Expected Outcomes  Short Term: Able to explain program exercise prescription;Long Term: Able to explain home exercise prescription to exercise independently          Exercise Goals Re-Evaluation :   Discharge Exercise Prescription (Final Exercise Prescription Changes): Exercise Prescription Changes - 08/02/19 0900      Response to Exercise   Blood Pressure (Admit)  128/64    Blood Pressure (Exercise)  148/60    Blood Pressure (Exit)  122/64    Heart Rate (Admit)  88 bpm    Heart Rate (Exercise)  119 bpm    Heart Rate (Exit)  95 bpm    Oxygen Saturation (Admit)  99  %    Oxygen Saturation (Exercise)  100 %    Rating of Perceived Exertion (Exercise)  17    Symptoms  joints achy    Comments  walk test results       Nutrition:  Target Goals: Understanding of nutrition guidelines, daily intake of sodium <1556m, cholesterol <209m calories 30% from fat and 7% or less from saturated fats, daily to have 5 or more servings of fruits and vegetables.  Education: Controlling Sodium/Reading Food Labels -Group verbal and written material supporting the discussion of sodium use in heart healthy nutrition. Review and explanation with models, verbal and written materials for utilization of the food label.   Education: General Nutrition Guidelines/Fats and Fiber: -Group instruction provided by verbal, written material, models and posters to present the general guidelines for heart healthy nutrition. Gives an explanation and review of dietary fats and fiber.   [facility]  Date  02/24/19  Educator  mc  Instruction Review Code  1- Verbalizes Understanding      Biometrics: Pre Biometrics - 08/02/19 1005      Pre Biometrics   Height  5' 9.9" (1.775 m)    Weight  189 lb (85.7 kg)    BMI (Calculated)  27.21    Single Leg Stand  1.21 seconds        Nutrition Therapy Plan and Nutrition Goals:   Nutrition Assessments: Nutrition Assessments -  08/02/19 1006      MEDFICTS Scores   Pre Score  56       MEDIFICTS Score Key:          ?70 Need to make dietary changes          40-70 Heart Healthy Diet         ? 40 Therapeutic Level Cholesterol Diet  Nutrition Goals Re-Evaluation:   Nutrition Goals Discharge (Final Nutrition Goals Re-Evaluation):   Psychosocial: Target Goals: Acknowledge presence or absence of significant depression and/or stress, maximize coping skills, provide positive support system. Participant is able to verbalize types and ability to use techniques and skills needed for  reducing stress and depression.   Education: Depression - Provides group verbal and written instruction on the correlation between heart/lung disease and depressed mood, treatment options, and the stigmas associated with seeking treatment.   Education: Sleep Hygiene -Provides group verbal and written instruction about how sleep can affect your health.  Define sleep hygiene, discuss sleep cycles and impact of sleep habits. Review good sleep hygiene tips.     Education: Stress and Anxiety: - Provides group verbal and written instruction about the health risks of elevated stress and causes of high stress.  Discuss the correlation between heart/lung disease and anxiety and treatment options. Review healthy ways to manage with stress and anxiety.    Initial Review & Psychosocial Screening: Initial Psych Review & Screening - 07/28/19 0940      Initial Review   Current issues with  None Identified      Family Dynamics   Good Support System?  Yes    Comments  He can look to his girlfriend and daughter for support.      Barriers   Psychosocial barriers to participate in program  The patient should benefit from training in stress management and relaxation.      Screening Interventions   Interventions  Encouraged to exercise;To provide support and resources with identified psychosocial needs;Provide feedback about the scores to participant    Expected Outcomes  Short Term goal: Utilizing psychosocial counselor, staff and physician to assist with identification of specific Stressors or current issues interfering with healing process. Setting desired goal for each stressor or current issue identified.;Long Term Goal: Stressors or current issues are controlled or eliminated.;Short Term goal: Identification and review with participant of any Quality of Life or Depression concerns found by scoring the questionnaire.;Long Term goal: The participant improves quality of Life and PHQ9 Scores as seen by  post scores and/or verbalization of changes       Quality of Life Scores:  Quality of Life - 08/02/19 1006      Quality of Life   Select  Quality of Life      Quality of Life Scores   Health/Function Pre  11.07 %    Socioeconomic Pre  21.56 %    Psych/Spiritual Pre  13.29 %    Family Pre  24 %    GLOBAL Pre  15.76 %      Scores of 19 and below usually indicate a poorer quality of life in these areas.  A difference of  2-3 points is a clinically meaningful difference.  A difference of 2-3 points in the total score of the Quality of Life Index has been associated with significant improvement in overall quality of life, self-image, physical symptoms, and general health in studies assessing change in quality of life.  PHQ-9: Recent Review Flowsheet Data    Depression screen  [facility] 2/9 08/02/2019 05/25/2019 03/23/2019 11/09/2018   Decreased Interest 3 0 0 0   Down, Depressed, Hopeless 1 0 0 0   PHQ - 2 Score 4 0 0 0   Altered sleeping 1 - - 0   Tired, decreased energy 2 - - 0   Change in appetite 1 - - 0   Feeling bad or failure about yourself  2 - - 0   Trouble concentrating 0 - - 0   Moving slowly or fidgety/restless 0 - - 0   Suicidal thoughts 0 - - 0   PHQ-9 Score 10 - - 0   Difficult doing work/chores Not difficult at all - - Not difficult at all     Interpretation of Total Score  Total Score Depression Severity:  1-4 = Minimal depression, 5-9 = Mild depression, 10-14 = Moderate depression, 15-19 = Moderately severe depression, 20-27 = Severe depression   Psychosocial Evaluation and Intervention: Psychosocial Evaluation - 07/28/19 0941      Psychosocial Evaluation & Interventions   Interventions  Encouraged to exercise with the program and follow exercise prescription;Stress management education    Comments  Jhony has a positive outlook on his health and is ready to start rehab.    Expected Outcomes  Short: Attend [facility] stress management education to decrease stress. Long:  Maintain exercise Post [facility] to keep stress at a minimum.    Continue Psychosocial Services   Follow up required by staff       Psychosocial Re-Evaluation:   Psychosocial Discharge (Final Psychosocial Re-Evaluation):   Vocational Rehabilitation: Provide vocational rehab assistance to qualifying candidates.   Vocational Rehab Evaluation & Intervention:   Education: Education Goals: Education classes will be provided on a variety of topics geared toward better understanding of heart health and risk factor modification. Participant will state understanding/return demonstration of topics presented as noted by education test scores.  Learning Barriers/Preferences: Learning Barriers/Preferences - 07/28/19 0941      Learning Barriers/Preferences   Learning Barriers  None    Learning Preferences  None       General Cardiac Education Topics:  AED/CPR: - Group verbal and written instruction with the use of models to demonstrate the basic use of the AED with the basic ABC's of resuscitation.   Anatomy & Physiology of the Heart: - Group verbal and written instruction and models provide basic cardiac anatomy and physiology, with the coronary electrical and arterial systems. Review of Valvular disease and Heart Failure   Cardiac Procedures: - Group verbal and written instruction to review commonly prescribed medications for heart disease. Reviews the medication, class of the drug, and side effects. Includes the steps to properly store meds and maintain the prescription regimen. (beta blockers and nitrates)   Cardiac Medications I: - Group verbal and written instruction to review commonly prescribed medications for heart disease. Reviews the medication, class of the drug, and side effects. Includes the steps to properly store meds and maintain the prescription regimen.   Cardiac Medications II: -Group verbal and written instruction to review commonly prescribed medications for  heart disease. Reviews the medication, class of the drug, and side effects. (all other drug classes)    Go Sex-Intimacy & Heart Disease, Get SMART - Goal Setting: - Group verbal and written instruction through game format to discuss heart disease and the return to sexual intimacy. Provides group verbal and written material to discuss and apply goal setting through the application of the S.M.A.R.T. Method.   Other [name] of the  Heart: - Provides group verbal, written materials and models to describe Stable Angina and Peripheral Artery. Includes description of the disease process and treatment options available to the cardiac patient.   Infection Prevention: - Provides verbal and written material to individual with discussion of infection control including proper hand washing and proper equipment cleaning during exercise session.   Cardiac Rehab from 07/28/2019 in [facility] Cardiac and Pulmonary Rehab  Date  07/28/19  Educator  [facility]  Instruction Review Code  1- Verbalizes Understanding      Falls Prevention: - Provides verbal and written material to individual with discussion of falls prevention and safety.   Cardiac Rehab from 07/28/2019 in [facility] Cardiac and Pulmonary Rehab  Date  07/28/19  Educator  [facility]  Instruction Review Code  1- Verbalizes Understanding      Other: -Provides group and verbal instruction on various topics (see comments)   Knowledge Questionnaire Score: Knowledge Questionnaire Score - 08/02/19 1006      Knowledge Questionnaire Score   Pre Score  23/26 Education Focus: NTG, angina, Exercise       Core Components/Risk Factors/Patient Goals at Admission: Personal Goals and Risk Factors at Admission - 08/02/19 1007      Core Components/Risk Factors/Patient Goals on Admission    Weight Management  Yes;Weight Loss    Intervention  Weight Management: Develop a combined nutrition and exercise program designed to reach desired caloric intake, while maintaining  appropriate intake of nutrient and fiber, sodium and fats, and appropriate energy expenditure required for the weight goal.;Weight Management: Provide education and appropriate resources to help participant work on and attain dietary goals.    Admit Weight  189 lb (85.7 kg)    Goal Weight: Short Term  184 lb (83.5 kg)    Goal Weight: Long Term  179 lb (81.2 kg)    Expected Outcomes  Short Term: Continue to assess and modify interventions until short term weight is achieved;Long Term: Adherence to nutrition and physical activity/exercise program aimed toward attainment of established weight goal;Weight Loss: Understanding of general recommendations for a balanced deficit meal plan, which promotes 1-2 lb weight loss per week and includes a negative energy balance of [phone] kcal/d;Understanding recommendations for meals to include 15-35% energy as protein, 25-35% energy from fat, 35-60% energy from carbohydrates, less than 271m of dietary cholesterol, 20-35 gm of total fiber daily;Understanding of distribution of calorie intake throughout the day with the consumption of 4-5 meals/snacks    Heart Failure  Yes    Intervention  Provide a combined exercise and nutrition program that is supplemented with education, support and counseling about heart failure. Directed toward relieving symptoms such as shortness of breath, decreased exercise tolerance, and extremity edema.    Expected Outcomes  Improve functional capacity of life;Short term: Attendance in program 2-3 days a week with increased exercise capacity. Reported lower sodium intake. Reported increased fruit and vegetable intake. Reports medication compliance.;Short term: Daily weights obtained and reported for increase. Utilizing diuretic protocols set by physician.;Long term: Adoption of self-care skills and reduction of barriers for early signs and symptoms recognition and intervention leading to self-care maintenance.    Hypertension  Yes     Intervention  Provide education on lifestyle modifcations including regular physical activity/exercise, weight management, moderate sodium restriction and increased consumption of fresh fruit, vegetables, and low fat dairy, alcohol moderation, and smoking cessation.;Monitor prescription use compliance.    Expected Outcomes  Short Term: Continued assessment and intervention until BP is < 140/929mHG in hypertensive  participants. < 130/54m HG in hypertensive participants with diabetes, heart failure or chronic kidney disease.;Long Term: Maintenance of blood pressure at goal levels.    Lipids  Yes    Intervention  Provide education and support for participant on nutrition & aerobic/resistive exercise along with prescribed medications to achieve LDL <772m HDL >4055m   Expected Outcomes  Short Term: Participant states understanding of desired cholesterol values and is compliant with medications prescribed. Participant is following exercise prescription and nutrition guidelines.;Long Term: Cholesterol controlled with medications as prescribed, with individualized exercise RX and with personalized nutrition plan. Value goals: LDL < 8m68mDL > 40 mg.       Education:Diabetes - Individual verbal and written instruction to review signs/symptoms of diabetes, desired ranges of glucose level fasting, after meals and with exercise. Acknowledge that pre and post exercise glucose checks will be done for 3 sessions at entry of program.   Education: Know Your Numbers and Risk Factors: -Group verbal and written instruction about important numbers in your health.  Discussion of what are risk factors and how they play a role in the disease process.  Review of Cholesterol, Blood Pressure, Diabetes, and BMI and the role they play in your overall health.   Core Components/Risk Factors/Patient Goals Review:    Core Components/Risk Factors/Patient Goals at Discharge (Final Review):    ITP Comments: ITP Comments     Row Name 07/28/19 0944 08/02/19 0941         ITP Comments  Virtual Orientation performed. Patient informed when to come in for RD and EP orientation. Diagnosis can be found in [facility].  Completed 6MWT and gym orientation.  Initial ITP created and sent for review to Dr. MarkEmily Filbertdical Director.         Comments: Initial ITP

## 2019-08-02 NOTE — Patient Instructions (Signed)
Patient Instructions  Patient Details  Name: Jeremy [name] MRN: [id number] Date of Birth: 1955-11-06 Referring Provider:  Wellington Hampshire, MD  Below are your personal goals for exercise, nutrition, and risk factors. Our goal is to help you stay on track towards obtaining and maintaining these goals. We will be discussing your progress on these goals with you throughout the program.  Initial Exercise Prescription: Initial Exercise Prescription - 08/02/19 0900      Date of Initial Exercise RX and Referring Provider   Date  08/02/19    Referring Provider  Kathlyn Sacramento MD      Treadmill   MPH  2.3    Grade  1    Minutes  15    METs  3.08      NuStep   Level  3    SPM  80    Minutes  15    METs  3      REL-XR   Level  3    Speed  50    Minutes  15    METs  3      T5 Nustep   Level  3    SPM  80    Minutes  15    METs  3      Biostep-RELP   Level  3    SPM  50    Minutes  15    METs  3      Prescription Details   Frequency (times per week)  2    Duration  Progress to 30 minutes of continuous aerobic without signs/symptoms of physical distress      Intensity   THRR 40-80% of Max Heartrate  116-144    Ratings of Perceived Exertion  11-13    Perceived Dyspnea  0-4      Progression   Progression  Continue to progress workloads to maintain intensity without signs/symptoms of physical distress.      Resistance Training   Training Prescription  Yes    Weight  3 lb    Reps  10-15       Exercise Goals: Frequency: Be able to perform aerobic exercise two to three times per week in program working toward 2-5 days per week of home exercise.  Intensity: Work with a perceived exertion of 11 (fairly light) - 15 (hard) while following your exercise prescription.  We will make changes to your prescription with you as you progress through the program.   Duration: Be able to do 30 to 45 minutes of continuous aerobic exercise in addition to a 5 minute warm-up and a 5  minute cool-down routine.   Nutrition Goals: Your personal nutrition goals will be established when you do your nutrition analysis with the dietician.  The following are general nutrition guidelines to follow: Cholesterol < 200mg /day Sodium < 1500mg /day Fiber: Men over 50 yrs - 30 grams per day  Personal Goals: Personal Goals and Risk Factors at Admission - 08/02/19 1007      Core Components/Risk Factors/Patient Goals on Admission    Weight Management  Yes;Weight Loss    Intervention  Weight Management: Develop a combined nutrition and exercise program designed to reach desired caloric intake, while maintaining appropriate intake of nutrient and fiber, sodium and fats, and appropriate energy expenditure required for the weight goal.;Weight Management: Provide education and appropriate resources to help participant work on and attain dietary goals.    Admit Weight  189 lb (85.7 kg)    Goal  Weight: Short Term  184 lb (83.5 kg)    Goal Weight: Long Term  179 lb (81.2 kg)    Expected Outcomes  Short Term: Continue to assess and modify interventions until short term weight is achieved;Long Term: Adherence to nutrition and physical activity/exercise program aimed toward attainment of established weight goal;Weight Loss: Understanding of general recommendations for a balanced deficit meal plan, which promotes 1-2 lb weight loss per week and includes a negative energy balance of [phone] kcal/d;Understanding recommendations for meals to include 15-35% energy as protein, 25-35% energy from fat, 35-60% energy from carbohydrates, less than 200mg  of dietary cholesterol, 20-35 gm of total fiber daily;Understanding of distribution of calorie intake throughout the day with the consumption of 4-5 meals/snacks    Heart Failure  Yes    Intervention  Provide a combined exercise and nutrition program that is supplemented with education, support and counseling about heart failure. Directed toward relieving symptoms  such as shortness of breath, decreased exercise tolerance, and extremity edema.    Expected Outcomes  Improve functional capacity of life;Short term: Attendance in program 2-3 days a week with increased exercise capacity. Reported lower sodium intake. Reported increased fruit and vegetable intake. Reports medication compliance.;Short term: Daily weights obtained and reported for increase. Utilizing diuretic protocols set by physician.;Long term: Adoption of self-care skills and reduction of barriers for early signs and symptoms recognition and intervention leading to self-care maintenance.    Hypertension  Yes    Intervention  Provide education on lifestyle modifcations including regular physical activity/exercise, weight management, moderate sodium restriction and increased consumption of fresh fruit, vegetables, and low fat dairy, alcohol moderation, and smoking cessation.;Monitor prescription use compliance.    Expected Outcomes  Short Term: Continued assessment and intervention until BP is < 140/52mm HG in hypertensive participants. < 130/16mm HG in hypertensive participants with diabetes, heart failure or chronic kidney disease.;Long Term: Maintenance of blood pressure at goal levels.    Lipids  Yes    Intervention  Provide education and support for participant on nutrition & aerobic/resistive exercise along with prescribed medications to achieve LDL 70mg , HDL >40mg .    Expected Outcomes  Short Term: Participant states understanding of desired cholesterol values and is compliant with medications prescribed. Participant is following exercise prescription and nutrition guidelines.;Long Term: Cholesterol controlled with medications as prescribed, with individualized exercise RX and with personalized nutrition plan. Value goals: LDL < 70mg , HDL > 40 mg.       Tobacco Use Initial Evaluation: Social History   Tobacco Use  Smoking Status Never Smoker  Smokeless Tobacco Never Used    Exercise Goals  and Review: Exercise Goals    Row Name 08/02/19 1005             Exercise Goals   Increase Physical Activity  Yes       Intervention  Provide advice, education, support and counseling about physical activity/exercise needs.;Develop an individualized exercise prescription for aerobic and resistive training based on initial evaluation findings, risk stratification, comorbidities and participant's personal goals.       Expected Outcomes  Short Term: Attend rehab on a regular basis to increase amount of physical activity.;Long Term: Add in home exercise to make exercise part of routine and to increase amount of physical activity.;Long Term: Exercising regularly at least 3-5 days a week.       Increase Strength and Stamina  Yes       Intervention  Provide advice, education, support and counseling about physical activity/exercise needs.;Develop an  individualized exercise prescription for aerobic and resistive training based on initial evaluation findings, risk stratification, comorbidities and participant's personal goals.       Expected Outcomes  Short Term: Increase workloads from initial exercise prescription for resistance, speed, and METs.;Short Term: Perform resistance training exercises routinely during rehab and add in resistance training at home;Long Term: Improve cardiorespiratory fitness, muscular endurance and strength as measured by increased METs and functional capacity (6MWT)       Able to understand and use rate of perceived exertion (RPE) scale  Yes       Intervention  Provide education and explanation on how to use RPE scale       Expected Outcomes  Short Term: Able to use RPE daily in rehab to express subjective intensity level;Long Term:  Able to use RPE to guide intensity level when exercising independently       Able to understand and use Dyspnea scale  Yes       Intervention  Provide education and explanation on how to use Dyspnea scale       Expected Outcomes  Short Term: Able  to use Dyspnea scale daily in rehab to express subjective sense of shortness of breath during exertion;Long Term: Able to use Dyspnea scale to guide intensity level when exercising independently       Knowledge and understanding of Target Heart Rate Range (THRR)  Yes       Intervention  Provide education and explanation of THRR including how the numbers were predicted and where they are located for reference       Expected Outcomes  Short Term: Able to state/look up THRR;Short Term: Able to use daily as guideline for intensity in rehab;Long Term: Able to use THRR to govern intensity when exercising independently       Able to check pulse independently  Yes       Intervention  Provide education and demonstration on how to check pulse in carotid and radial arteries.;Review the importance of being able to check your own pulse for safety during independent exercise       Expected Outcomes  Short Term: Able to explain why pulse checking is important during independent exercise;Long Term: Able to check pulse independently and accurately       Understanding of Exercise Prescription  Yes       Intervention  Provide education, explanation, and written materials on patient's individual exercise prescription       Expected Outcomes  Short Term: Able to explain program exercise prescription;Long Term: Able to explain home exercise prescription to exercise independently          Copy of goals given to participant.

## 2019-08-08 ENCOUNTER — Ambulatory Visit (INDEPENDENT_AMBULATORY_CARE_PROVIDER_SITE_OTHER): Payer: Medicare Other

## 2019-08-08 ENCOUNTER — Encounter: Payer: Self-pay | Admitting: Cardiovascular Disease

## 2019-08-08 DIAGNOSIS — I4821 Permanent atrial fibrillation: Secondary | ICD-10-CM | POA: Diagnosis not present

## 2019-08-08 DIAGNOSIS — Z5181 Encounter for therapeutic drug level monitoring: Secondary | ICD-10-CM | POA: Diagnosis not present

## 2019-08-08 DIAGNOSIS — Z952 Presence of prosthetic heart valve: Secondary | ICD-10-CM

## 2019-08-08 LAB — LAB REPORT - SCANNED: INR: 2.4

## 2019-08-08 LAB — POCT INR: INR: 2.4 (ref 2.0–3.0)

## 2019-08-08 NOTE — Telephone Encounter (Signed)
Mailed letter and placed recalls.

## 2019-08-08 NOTE — Patient Instructions (Signed)
Spoke w/ pt and advised him to take 2 mg warfarin tonight, then continue dose of 1mg  daily. Recheck INR in 1 week.

## 2019-08-09 ENCOUNTER — Other Ambulatory Visit: Payer: Self-pay

## 2019-08-09 ENCOUNTER — Encounter (INDEPENDENT_AMBULATORY_CARE_PROVIDER_SITE_OTHER): Payer: Self-pay | Admitting: Vascular Surgery

## 2019-08-09 ENCOUNTER — Ambulatory Visit (INDEPENDENT_AMBULATORY_CARE_PROVIDER_SITE_OTHER): Payer: Medicare Other | Admitting: Vascular Surgery

## 2019-08-09 ENCOUNTER — Telehealth (INDEPENDENT_AMBULATORY_CARE_PROVIDER_SITE_OTHER): Payer: Self-pay

## 2019-08-09 VITALS — BP 118/66 | HR 109 | Resp 16 | Wt 187.0 lb

## 2019-08-09 DIAGNOSIS — E785 Hyperlipidemia, unspecified: Secondary | ICD-10-CM | POA: Diagnosis not present

## 2019-08-09 DIAGNOSIS — I2510 Atherosclerotic heart disease of native coronary artery without angina pectoris: Secondary | ICD-10-CM

## 2019-08-09 DIAGNOSIS — N186 End stage renal disease: Secondary | ICD-10-CM | POA: Diagnosis not present

## 2019-08-09 DIAGNOSIS — I7025 Atherosclerosis of native arteries of other extremities with ulceration: Secondary | ICD-10-CM | POA: Diagnosis not present

## 2019-08-09 DIAGNOSIS — I10 Essential (primary) hypertension: Secondary | ICD-10-CM

## 2019-08-09 NOTE — Assessment & Plan Note (Signed)
The patient has known significant peripheral arterial disease.  We have held on performing his angiogram until he got his cardiac situation squared away.  Now that he has had his cardiac intervention, I think we need to proceed with angiogram and possible revascularization.  The wound has stalled out although it is better than it was a few months ago.  With the persistent wound in a dialysis patient with known peripheral arterial disease, this is clearly a limb threatening situation and angiogram is indicated.  Risks and benefits of the procedure were discussed with the patient he is agreeable to proceed.

## 2019-08-09 NOTE — Telephone Encounter (Signed)
Spoke with the patient and he is now scheduled for a right leg angio with Dr. Lucky Cowboy on 08/18/19 with a 9:30 am arrival time to [facility]. Patient will do covid testing on 08/16/19 between 8-1 pm at the [facility]. Pre-procedure instructions were discussed and will be mailed.

## 2019-08-09 NOTE — Assessment & Plan Note (Signed)
blood pressure control important in reducing the progression of atherosclerotic disease. On appropriate oral medications.  

## 2019-08-09 NOTE — Patient Instructions (Signed)
Angiogram  An angiogram is a procedure used to examine the blood vessels. In this procedure, contrast dye is injected through a long, thin tube (catheter) into an artery. X-rays are then taken, which show if there is a blockage or problem in a blood vessel. The catheter may be inserted in:  The groin area. This is the most common.  The fold of the arm, near the elbow.  The wrist. Tell a health care provider about:  Any allergies you have, including allergies to medicines, shellfish, contrast dye, or iodine.  All medicines you are taking, including vitamins, herbs, eye drops, creams, and over-the-counter medicines.  Any problems you or family members have had with anesthetic medicines.  Any blood disorders you have.  Any surgeries you have had.  Any medical conditions you have or have had, including any kidney problems or kidney failure.  Whether you are pregnant or may be pregnant.  Whether you are breastfeeding. What are the risks? Generally, this is a safe procedure. However, problems may occur, including:  Infection.  Bleeding and bruising.  Allergic reactions to medicines or dyes, including the contrast dye used.  Damage to nearby structures or organs, including damage to blood vessels and kidney damage from the contrast dye.  Blood clots that can lead to a stroke or heart attack. What happens before the procedure? Staying hydrated Follow instructions from your health care provider about hydration, which may include:  Up to 2 hours before the procedure - you may continue to drink clear liquids, such as water, clear fruit juice, black coffee, and plain tea.  Eating and drinking restrictions Follow instructions from your health care provider about eating and drinking, which may include:  8 hours before the procedure - stop eating heavy meals or foods, such as meat, fried foods, or fatty foods.  6 hours before the procedure - stop eating light meals or foods, such  as toast or cereal.  2 hours before the procedure - stop drinking clear liquids. Medicines Ask your health care provider about:  Changing or stopping your regular medicines. This is especially important if you are taking diabetes medicines or blood thinners.  Taking medicines such as aspirin and ibuprofen. These medicines can thin your blood. Do not take these medicines unless your health care provider tells you to take them.  Taking over-the-counter medicines, vitamins, herbs, and supplements. Surgery safety Ask your health care provider:  How your insertion site will be marked.  What steps will be taken to help prevent infection. These may include: ? Removing hair at the insertion site. ? Washing skin with a germ-killing soap. ? Taking antibiotic medicine. General instructions  Do not use any products that contain nicotine or tobacco for at least 4 weeks before the procedure. These products include cigarettes, e-cigarettes, and chewing tobacco. If you need help quitting, ask your health care provider.  You may have blood samples taken.  Plan to have someone take you home from the hospital or clinic.  If you will be going home right after the procedure, plan to have someone with you for 24 hours. What happens during the procedure?  You will lie on your back on an X-ray table. You may be strapped to the table if it is tilted.  An IV will be inserted into one of your veins.  Electrodes may be placed on your chest to monitor your heart rate during the procedure.  You will be given one or both of the following: ? A medicine to   help you relax (sedative). ? A medicine to numb the area where the catheter will be inserted (local anesthetic).  A small incision will be made for catheter insertion.  The catheter will be inserted into an artery using a guide wire. An X-ray procedure (fluoroscopy) will be used to help guide the catheter to the blood vessel to be examined.  A contrast  dye will then be injected into the catheter, and X-rays will be taken. The contrast will help to show where any narrowing or blockages are located in the blood vessels. You may feel flushed as the contrast dye is injected.  Tell your health care provider if you develop chest pain or trouble breathing.  After the fluoroscopy is complete, the catheter will be removed.  A bandage (dressing) will be placed over the site where the catheter was inserted. Pressure will be applied to help stop any bleeding.  The IV will be removed. The procedure may vary among health care providers and hospitals. What happens after the procedure?  Your blood pressure, heart rate, breathing rate, and blood oxygen level will be monitored until you leave the hospital or clinic.  You will be kept in bed lying flat for 6 hours. If the catheter was inserted through your leg, you will be instructed not to bend or cross your legs.  The insertion area and the pulse in your feet or wrist will be checked frequently.  You will be instructed to drink plenty of fluids. This will help wash the contrast dye out of your body.  You may have more blood tests and X-rays. You may also have a test that records the electrical activity of your heart (electrocardiogram, or ECG).  Do not drive for 24 hours if you were given a sedative during your procedure.  It is up to you to get the results of your procedure. Ask your health care provider, or the department that is doing the procedure, when your results will be ready. Summary  An angiogram is a procedure used to examine the blood vessels.  Before the procedure, follow your health care provider's instructions about eating and drinking restrictions. You may be asked to stop eating and drinking several hours before the procedure.  During the procedure, contrast dye is injected through a thin tube (catheter) into an artery. X-rays are then taken.  After the procedure, you will need to  drink plenty of fluids and lie flat for 6 hour. This information is not intended to replace advice given to you by your health care provider. Make sure you discuss any questions you have with your health care provider. Document Revised: 10/06/2018 Document Reviewed: 10/06/2018 [employer] Patient Education  2020 [employer]  

## 2019-08-09 NOTE — Progress Notes (Signed)
MRN : [id number]  Jeremy [name] is a 64 y.o. (1956-03-22) male who presents with chief complaint of  Chief Complaint  Patient presents with  . Follow-up    38month follow up  .  History of Present Illness: Patient returns today in follow up of his PAD and nonhealing ulceration of the right foot.  This is really stalled out and has not improved much since he was last here.  It has not worsened either.  Since his last visit, he has undergone a coronary intervention for severe disease.  He is doing well from that and does say he feels better.  They went in the right groin and he did get some groin hematoma which has slowly resolved.  We had been holding off on doing a right leg angiogram until his cardiac intervention was done.  No fevers or chills or signs of systemic infection.  Current Outpatient Medications  Medication Sig Dispense Refill  . acetaminophen (TYLENOL) 500 MG tablet Take 1,000 mg 2 (two) times daily as needed by mouth for moderate pain.    Marland Kitchen allopurinol (ZYLOPRIM) 300 MG tablet Take 300 mg by mouth daily.  5  . cinacalcet (SENSIPAR) 30 MG tablet Take 30 mg by mouth every other day.     . clopidogrel (PLAVIX) 75 MG tablet Take 1 tablet (75 mg total) by mouth daily with breakfast. 90 tablet 3  . lidocaine-prilocaine (EMLA) cream Apply 1 application topically as needed (for fistula).    . loratadine (CLARITIN) 10 MG tablet Take 10 mg by mouth daily.    . metoprolol tartrate (LOPRESSOR) 25 MG tablet Take 1 tablet (25 mg total) by mouth in the morning, at noon, and at bedtime. 270 tablet 3  . multivitamin (RENA-VIT) TABS tablet Take 1 tablet by mouth daily.    . nitroGLYCERIN (NITROSTAT) 0.4 MG SL tablet Place 1 tablet (0.4 mg total) under the tongue every 5 (five) minutes as needed for chest pain. 25 tablet 3  . pantoprazole (PROTONIX) 40 MG tablet Take 1 tablet (40 mg total) by mouth daily. 90 tablet 3  . predniSONE (DELTASONE) 5 MG tablet Take 5 mg by mouth daily with breakfast.      . rosuvastatin (CRESTOR) 40 MG tablet Take 1 tablet (40 mg total) by mouth daily at 6 PM. 90 tablet 3  . sevelamer carbonate (RENVELA) 800 MG tablet Take 4,000 mg by mouth 3 (three) times daily with meals. 5 tabs per meal     . warfarin (COUMADIN) 1 MG tablet Take 1-1.5 tablets (1-1.5 mg total) by mouth See admin instructions. Take 1mg  on Sun, Tues, Thur and Sat Take 1.5mg  on Mon, Wed, Fri 180 tablet 1  . ezetimibe (ZETIA) 10 MG tablet Take 1 tablet (10 mg total) by mouth daily. 90 tablet 3   No current facility-administered medications for this visit.    Past Medical History:  Diagnosis Date  . Abnormal stress test 03/19/2018  . 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 Aortic stenosis    a. 2014 s/p mech AVR ([facility])-->chronic coumadin; b. 08/2018 Echo: Mild AS/AI. Nl fxn/gradient.  . Arthritis   . Atrial fibrillation ([place])   . CAD (coronary artery disease)    a. 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 02/2018 MV [facility]): large inf, infsept, inflat infarct w/ min rev, EF 30%; c. 06/2018 Cath: LM min irregs, LAD 60p, 95/30/48m, LCX min irregs, RCA  40p, 35m, 90d, RPAV 90, RPDA 95ost; c. 09/2018 CABGx3 (LIMA->LAD, VG->D1, VG->PDA).  . Chronic anticoagulation 06/2012   a. Coumadin in the setting of mech AVR and afib.  Marland Kitchen COVID-19 03/15/2019  . CPAP (continuous positive airway pressure) dependence   . Dialysis patient [facility]) 04/07/2006   Patient started HD around 1996,  had 1st transplant LLQ around 1998 until 2007 when they found renal cell cancer in transplant and both native kidneys, all were removed > went back on HD until 2nd transplant RLQ in 2014 which lasted 3 yrs; it was embolized for suspected acute rejection. Is currently on HD MWF in [place] w/ [facility].  . ESRD (end stage renal disease) ([place])    a. CKD 2/2 to IgA nephropathy (dx age 29); b. 1999 s/p Kidney transplant; c. 2014 Bilat nephrectomy (transplanted  kidney resected) in the setting of renal cell carcinoma; d. PD until 11/2017-->HD.  Marland Kitchen Heart murmur   . HFrEF (heart failure with reduced ejection fraction) ([place])    a. 07/2016 Echo: EF 55-60%; b. 02/2018 MV: EF 30%; c. 04/2018 Echo: EF 35%; d. 08/2018 Echo: EF 30-35%. Glob HK w/o rwma. Mildly reduced RV fxn. Mod to sev dil LA. Nl AoV fxn.  Marland Kitchen Hx of colonoscopy    2009 by Dr. Laural Golden. Normal except for small submucosal lipoma. No evidence of polyps or colitis.  . Hyperlipidemia   . Hypertension   . Hypogonadism in male 07/25/2015  . Ischemic cardiomyopathy    a. 07/2016 Echo: EF 55-60%; b. 02/2018 MV: EF 30%; c. 04/2018 Echo: EF 35%; d. 08/2018 Echo: EF 30-35%.  . OSA (obstructive sleep apnea)    No CPAP  . Permanent atrial fibrillation ([facility])    a. CHA2DS2VASc = 3-->Coumadin (also mech AVR); b. 06/2018 Amio started 2/2 elevated rates.  . Renal cell cancer ([place])   . Renal cell carcinoma ([place]) 08/04/2016    Past Surgical History:  Procedure Laterality Date  . AORTIC VALVE REPLACEMENT  3 /11 /2014   At [place]    . CAPD INSERTION N/A 02/19/2017   Procedure: LAPAROSCOPIC INSERTION CONTINUOUS AMBULATORY PERITONEAL DIALYSIS  (CAPD) CATHETER;  Surgeon: Algernon Huxley, MD;  Location: [facility] OR[facility];  Service: General;  Laterality: N/A;  . CORONARY ANGIOPLASTY WITH STENT PLACEMENT    . CORONARY ARTERY BYPASS GRAFT N/A 09/16/2018   Procedure: CORONARY ARTERY BYPASS GRAFT times three      with left internal mammary artery and endoharvest of right leg greater saphenous vein;  Surgeon: Melrose Nakayama, MD;  Location: [place];  Service: Open Heart Surgery;  Laterality: N/A;  . CORONARY STENT INTERVENTION N/A 07/15/2019   Procedure: CORONARY STENT INTERVENTION;  Surgeon: Wellington Hampshire, MD;  Location: [place] CV LAB;  Service: Cardiovascular;  Laterality: N/A;  . DG AV DIALYSIS  SHUNT ACCESS EXIST*L* OR     left arm  . EPIGASTRIC HERNIA REPAIR N/A 02/19/2017    Procedure: HERNIA REPAIR EPIGASTRIC ADULT;  Surgeon: Robert Bellow, MD;  Location: [facility] OR[facility];  Service: General;  Laterality: N/A;  . EYE SURGERY     bilateral cataract  . HERNIA REPAIR     UMBILICAL HERNIA  . HERNIA REPAIR  02/19/2017  . INNER EAR SURGERY     20 years ago  . KIDNEY TRANSPLANT    . LEFT HEART CATH AND CORS/GRAFTS ANGIOGRAPHY N/A 07/15/2019   Procedure: LEFT HEART CATH AND CORS/GRAFTS ANGIOGRAPHY;  Surgeon: Wellington Hampshire, MD;  Location: [place] CV [facility];  Service: Cardiovascular;  Laterality: N/A;  . Peritoneal Dialysis access placement  02/19/2017  . REMOVAL OF A DIALYSIS CATHETER N/A 11/23/2017   Procedure: REMOVAL OF A PERITONEAL DIALYSIS CATHETER;  Surgeon: Algernon Huxley, MD;  Location: [facility];  Service: Vascular;  Laterality: N/A;  . RIGHT/LEFT HEART CATH AND CORONARY ANGIOGRAPHY Bilateral 06/07/2018   Procedure: RIGHT/LEFT HEART CATH AND CORONARY ANGIOGRAPHY;  Surgeon: Wellington Hampshire, MD;  Location: [place] CV LAB;  Service: Cardiovascular;  Laterality: Bilateral;  . TEE WITHOUT CARDIOVERSION N/A 07/21/2016   Procedure: TRANSESOPHAGEAL ECHOCARDIOGRAM (TEE);  Surgeon: Wellington Hampshire, MD;  Location: [facility];  Service: Cardiovascular;  Laterality: N/A;  . TEE WITHOUT CARDIOVERSION N/A 09/16/2018   Procedure: TRANSESOPHAGEAL ECHOCARDIOGRAM (TEE);  Surgeon: Melrose Nakayama, MD;  Location: [place];  Service: Open Heart Surgery;  Laterality: N/A;     Social History   Tobacco Use  . Smoking status: Never Smoker  . Smokeless tobacco: Never Used  Substance Use Topics  . Alcohol use: No  . Drug use: No      Family History  Problem Relation Age of Onset  . Cancer Mother        pancreatic  . Heart disease Father   . Diabetes Father     Allergies  Allergen Reactions  . Statins     Other reaction(s): Arthralgias (intolerance), Myalgias (intolerance)  Pt taking pravastatin   . Sertraline Rash     REVIEW OF SYSTEMS (Negative unless  checked)  Constitutional: [] ??Weight loss[] ??Fever[] ??Chills Cardiac:[] ??Chest pain[] ??Atrial Fibrillation[] ??Palpitations [] ??Shortness of breath when laying flat [] ??Shortness of breath with exertion. [] ??Shortness of breath at rest Vascular: [] ??Pain in legs with walking[] ??Pain in legswith standing[] ??Pain in legs when laying flat [] ??Claudication [] ??Pain in feet when laying flat [] ??History of DVT [] ??Phlebitis [] ??Swelling in legs [] ??Varicose veins [x] ??Non-healing ulcers Pulmonary: [] ??Uses home oxygen [] ??Productive cough[] ??Hemoptysis [] ??Wheeze [] ??COPD [] ??Asthma Neurologic: [] ??Dizziness[] ??Seizures [] ??Blackouts[] ??History of stroke [] ??History of TIA[] ??Aphasia [] ??Temporary Blindness[] ??Weaknessor numbness in arm [] ??Weakness or numbnessin leg Musculoskeletal:[] ??Joint swelling [] ??Joint pain [] ??Low back pain [] ??History of Knee Replacement [x] ??Arthritis [] ??back Surgeries[] ??Spinal Stenosis  Hematologic:[] ??Easy bruising[] ??Easy bleeding [] ??Hypercoagulable state [x] ??Anemic Gastrointestinal:[] ??Diarrhea [] ??Vomiting[] ??Gastroesophageal reflux/heartburn[] ??Difficulty swallowing. [] ??Abdominal pain Genitourinary: [x] ??Chronic kidney disease [] ??Difficulturination [] ??Anuric[] ??Blood in urine [] ??Frequenturination [] ??Burning with urination[] ??Hematuria Skin: [] ??Rashes [x] ??Ulcers [] ??Wounds Psychological: [] ??History of anxiety[x] ??History of major depression [] ??Memory Difficulties  Physical Examination  BP 118/66 (BP Location: Right Arm)   Pulse (!) 109   Resp 16   Wt 187 lb (84.8 kg)   BMI 26.91 kg/m  Gen:  WD/WN, NAD Head: [place]/AT, No temporalis wasting. Ear/Nose/Throat: Hearing grossly intact, nares w/o erythema or drainage Eyes: Conjunctiva clear. Sclera non-icteric Neck: Supple.  Trachea midline Pulmonary:  Good air movement, no use of  accessory muscles.  Cardiac: RRR, no JVD Vascular: Aneurysmal left upper extremity AV fistula is present with a good thrill Vessel Right Left  Radial Palpable Palpable                          PT Not Palpable 1+ Palpable  DP 1+ Palpable Trace Palpable   Gastrointestinal: soft, non-tender/non-distended. No guarding/reflex.  Musculoskeletal: M/S 5/5 throughout.  No deformity or atrophy.  Small ulceration on the lateral fifth metatarsal area.  This has some pale fibrinous exudate.  This is less than a centimeter in diameter with no surrounding erythema.  No significant lower extremity edema. Neurologic: Sensation grossly intact in extremities.  Symmetrical.  Speech is fluent.  Psychiatric: Judgment intact, Mood & affect appropriate  for pt's clinical situation. Dermatologic: Right foot wound as above       Labs Recent Results (from the past 2160 hour(s))  POCT INR     Status: Abnormal   Collection Time: 05/16/19 12:00 AM  Result Value Ref Range   INR 3.5 (A) 2.0 - 3.0    Comment: pt calling w/ home results  POCT INR     Status: None   Collection Time: 05/25/19 12:00 AM  Result Value Ref Range   INR 2.6 2.0 - 3.0    Comment: mdINR faxed results  Protime-INR     Status: None   Collection Time: 05/25/19 12:00 AM  Result Value Ref Range   INR    Lab report - scanned     Status: None   Collection Time: 05/25/19 12:00 AM  Result Value Ref Range   INR 2.6   POCT INR     Status: None   Collection Time: 05/30/19 12:00 AM  Result Value Ref Range   INR 2.7 2.0 - 3.0    Comment: mdINR faxed results  Lab report - scanned     Status: None   Collection Time: 05/30/19 12:00 AM  Result Value Ref Range   INR 2.7   POCT INR     Status: None   Collection Time: 06/06/19 12:00 AM  Result Value Ref Range   INR 2.5 2.0 - 3.0    Comment: results faxed from [facility]  POCT INR     Status: None   Collection Time: 06/15/19 12:00 AM  Result Value Ref Range   INR 2.4 2.0 - 3.0    Comment:  mdINR results faxed over  Hepatic function panel     Status: Abnormal   Collection Time: 06/21/19  9:32 AM  Result Value Ref Range   Total Protein 7.6 6.5 - 8.1 g/dL   Albumin 3.9 3.5 - 5.0 g/dL   AST 23 15 - 41 U/L   ALT 19 0 - 44 U/L   Alkaline Phosphatase 152 (H) 38 - 126 U/L   Total Bilirubin 0.7 0.3 - 1.2 mg/dL   Bilirubin, Direct <0.1 0.0 - 0.2 mg/dL   Indirect Bilirubin NOT CALCULATED 0.3 - 0.9 mg/dL    Comment: Performed at [facility], [address] [place], [place] [address]  Lipid panel     Status: Abnormal   Collection Time: 06/21/19  9:32 AM  Result Value Ref Range   Cholesterol 136 0 - 200 mg/dL   Triglycerides 150 (H) <150 mg/dL   HDL 33 (L) >40 mg/dL   Total CHOL/HDL Ratio 4.1 RATIO   VLDL 30 0 - 40 mg/dL   LDL Cholesterol 73 0 - 99 mg/dL    Comment:        Total Cholesterol/HDL:CHD Risk Coronary Heart Disease Risk Table                     Men   Women  1/2 Average Risk   3.4   3.3  Average Risk       5.0   4.4  2 X Average Risk   9.6   7.1  3 X Average Risk  23.4   11.0        Use the calculated Patient Ratio above and the CHD Risk Table to determine the patient's CHD Risk.        ATP III CLASSIFICATION (LDL):  <100     mg/dL   Optimal  100-129  mg/dL  Near or Above                    Optimal  130-159  mg/dL   Borderline  160-189  mg/dL   High  >190     mg/dL   Very High Performed at [facility], [address] [place], [place] [address]   POCT INR     Status: None   Collection Time: 06/22/19 12:00 AM  Result Value Ref Range   INR 3.0 2.0 - 3.0    Comment: results faxed from [facility]  Lab report - scanned     Status: None   Collection Time: 06/22/19 12:00 AM  Result Value Ref Range   INR 3.0   Lab report - scanned     Status: None   Collection Time: 06/22/19 12:00 AM  Result Value Ref Range   INR 3.0   POCT INR     Status: None   Collection Time: 06/29/19 12:00 AM  Result Value Ref Range   INR 2.5 2.0 - 3.0     Comment: self-tester  Lab report - scanned     Status: None   Collection Time: 07/04/19 12:00 AM  Result Value Ref Range   INR 2.2   POCT INR     Status: None   Collection Time: 07/04/19  1:18 PM  Result Value Ref Range   INR 2.2 2.0 - 3.0    Comment: results faxed from [place] W/Spect W/[employer] / EF     Status: None   Collection Time: 07/07/19 10:29 AM  Result Value Ref Range   Rest HR 90 bpm   Rest BP 169/111 mmHg   Peak HR 110 bpm   Peak BP 169/77 mmHg   SSS 25    SRS 22    SDS 11    TID 0.39    LV sys vol 117 mL   LV dias vol 179 62 - 150 mL  POCT INR     Status: None   Collection Time: 07/11/19 12:00 AM  Result Value Ref Range   INR 2.1 2.0 - 3.0    Comment: self tester  SARS CORONAVIRUS 2 (TAT 6-24 HRS) Nasopharyngeal Nasopharyngeal Swab     Status: None   Collection Time: 07/12/19  8:43 AM   Specimen: Nasopharyngeal Swab  Result Value Ref Range   SARS Coronavirus 2 NEGATIVE NEGATIVE    Comment: (NOTE) SARS-CoV-2 target nucleic acids are NOT DETECTED. The SARS-CoV-2 RNA is generally detectable in upper and lower respiratory specimens during the acute phase of infection. Negative results do not preclude SARS-CoV-2 infection, do not rule out co-infections with other pathogens, and should not be used as the sole basis for treatment or other patient management decisions. Negative results must be combined with clinical observations, patient history, and epidemiological information. The expected result is Negative. Fact Sheet for Patients: SugarRoll.be Fact Sheet for Healthcare Providers: https://www.woods-mathews.com/ This test is not yet approved or cleared by the [place] FDA and  has been authorized for detection and/or diagnosis of SARS-CoV-2 by FDA under an Emergency Use Authorization (EUA). This EUA will remain  in effect (meaning this test can be used) for the duration of the COVID-19  declaration under Section 56 4(b)(1) of the Act, 21 U.S.C. section 360bbb-3(b)(1), unless the authorization is terminated or revoked sooner. Performed at [facility], [place] [address], [place], [place] [address]   CBC WITH DIFFERENTIAL     Status:  Abnormal   Collection Time: 07/13/19  6:49 PM  Result Value Ref Range   WBC 5.6 4.0 - 10.5 K/uL   RBC 3.32 (L) 4.22 - 5.81 MIL/uL   Hemoglobin 11.1 (L) 13.0 - 17.0 g/dL   HCT 36.2 (L) 39.0 - 52.0 %   MCV 109.0 (H) 80.0 - 100.0 fL   MCH 33.4 26.0 - 34.0 pg   MCHC 30.7 30.0 - 36.0 g/dL   RDW 13.4 11.5 - 15.5 %   Platelets 128 (L) 150 - 400 K/uL    Comment: REPEATED TO VERIFY   nRBC 0.0 0.0 - 0.2 %   Neutrophils Relative % 66 %   Neutro Abs 3.7 1.7 - 7.7 K/uL   Lymphocytes Relative 23 %   Lymphs Abs 1.3 0.7 - 4.0 K/uL   Monocytes Relative 9 %   Monocytes Absolute 0.5 0.1 - 1.0 K/uL   Eosinophils Relative 2 %   Eosinophils Absolute 0.1 0.0 - 0.5 K/uL   Basophils Relative 0 %   Basophils Absolute 0.0 0.0 - 0.1 K/uL   Immature Granulocytes 0 %   Abs Immature Granulocytes 0.02 0.00 - 0.07 K/uL    Comment: Performed at [facility], [address], [place], [place] [address]  Protime-INR     Status: Abnormal   Collection Time: 07/13/19  6:49 PM  Result Value Ref Range   Prothrombin Time 20.7 (H) 11.4 - 15.2 seconds   INR 1.8 (H) 0.8 - 1.2    Comment: (NOTE) INR goal varies based on device and disease states. Performed at [facility], [place] [address] [place], [place] [address]   Basic metabolic panel     Status: Abnormal   Collection Time: 07/14/19  2:22 AM  Result Value Ref Range   Sodium 139 135 - 145 mmol/L   Potassium 4.7 3.5 - 5.1 mmol/L   Chloride 95 (L) 98 - 111 mmol/L   CO2 30 22 - 32 mmol/L   Glucose, Bld 148 (H) 70 - 99 mg/dL    Comment: Glucose reference range applies only to samples taken after fasting for at least 8 hours.   BUN 26 (H) 8 - 23 mg/dL   Creatinine, Ser 5.80 (H) 0.61 - 1.24 mg/dL    Calcium 8.7 (L) 8.9 - 10.3 mg/dL   GFR calc non Af Amer 10 (L) >60 mL/min   GFR calc Af Amer 11 (L) >60 mL/min   Anion gap 14 5 - 15    Comment: Performed at [facility] [address], [place], [place] [address]  Protime-INR     Status: Abnormal   Collection Time: 07/14/19  2:22 AM  Result Value Ref Range   Prothrombin Time 21.2 (H) 11.4 - 15.2 seconds   INR 1.8 (H) 0.8 - 1.2    Comment: (NOTE) INR goal varies based on device and disease states. Performed at [facility], [place] [address] [place], [name] [address]   Heparin level (unfractionated)     Status: Abnormal   Collection Time: 07/14/19  2:22 AM  Result Value Ref Range   Heparin Unfractionated 0.13 (L) 0.30 - 0.70 IU/mL    Comment: (NOTE) If heparin results are below expected values, and patient dosage has  been confirmed, suggest follow up testing of antithrombin III levels. Performed at [facility], [place] [address], [place], [place] [address]   CBC     Status: Abnormal   Collection Time: 07/14/19  2:22 AM  Result Value Ref Range   WBC 5.6 4.0 - 10.5 K/uL   RBC 3.00 (L) 4.22 - 5.81 MIL/uL   Hemoglobin 10.1 (L) 13.0 - 17.0 g/dL   HCT 32.3 (L) 39.0 - 52.0 %   MCV 107.7 (H) 80.0 - 100.0 fL   MCH 33.7 26.0 - 34.0 pg   MCHC 31.3 30.0 - 36.0 g/dL   RDW 13.4 11.5 - 15.5 %   Platelets 111 (L) 150 - 400 K/uL    Comment: REPEATED TO VERIFY PLATELET COUNT CONFIRMED BY SMEAR SPECIMEN CHECKED FOR CLOTS Immature Platelet Fraction may be clinically indicated, consider ordering this additional test [id number]    nRBC 0.4 (H) 0.0 - 0.2 %    Comment: Performed at [facility], [place] [address], [place], [name] [address]  Heparin level (unfractionated)     Status: Abnormal   Collection Time: 07/14/19  4:47 AM  Result Value Ref Range   Heparin Unfractionated 0.26 (L) 0.30 - 0.70 IU/mL    Comment: (NOTE) If heparin results are below expected values, and patient dosage has  been confirmed, suggest  follow up testing of antithrombin III levels. Performed at [facility], [place] [address], [place], [name] [address]   Heparin level (unfractionated)     Status: Abnormal   Collection Time: 07/14/19 10:50 AM  Result Value Ref Range   Heparin Unfractionated 0.25 (L) 0.30 - 0.70 IU/mL    Comment: (NOTE) If heparin results are below expected values, and patient dosage has  been confirmed, suggest follow up testing of antithrombin III levels. Performed at [facility], [place] [address] [place], [name] [address]   Heparin level (unfractionated)     Status: None   Collection Time: 07/14/19  9:02 PM  Result Value Ref Range   Heparin Unfractionated 0.50 0.30 - 0.70 IU/mL    Comment: (NOTE) If heparin results are below expected values, and patient dosage has  been confirmed, suggest follow up testing of antithrombin III levels. Performed at [facility], [place] [address] [place], [place] [address]   Basic metabolic panel     Status: Abnormal   Collection Time: 07/15/19  3:41 AM  Result Value Ref Range   Sodium 139 135 - 145 mmol/L   Potassium 4.6 3.5 - 5.1 mmol/L   Chloride 94 (L) 98 - 111 mmol/L   CO2 29 22 - 32 mmol/L   Glucose, Bld 98 70 - 99 mg/dL    Comment: Glucose reference range applies only to samples taken after fasting for at least 8 hours.   BUN 43 (H) 8 - 23 mg/dL   Creatinine, Ser 8.39 (H) 0.61 - 1.24 mg/dL   Calcium 9.1 8.9 - 10.3 mg/dL   GFR calc non Af Amer 6 (L) >60 mL/min   GFR calc Af Amer 7 (L) >60 mL/min   Anion gap 16 (H) 5 - 15    Comment: Performed at [facility] [address], [place], [place] [address]  Protime-INR     Status: Abnormal   Collection Time: 07/15/19  3:41 AM  Result Value Ref Range   Prothrombin Time 17.0 (H) 11.4 - 15.2 seconds   INR 1.4 (H) 0.8 - 1.2    Comment: (NOTE) INR goal varies based on device and disease states. Performed at [facility], [place] [address] [place], [place] [address]   CBC      Status: Abnormal   Collection Time: 07/15/19  3:41 AM  Result Value Ref Range   WBC 5.9 4.0 - 10.5 K/uL   RBC 2.92 (L) 4.22 - 5.81 MIL/uL   Hemoglobin 9.9 (L) 13.0 - 17.0 g/dL   HCT 31.5 (L) 39.0 - 52.0 %   MCV 107.9 (H) 80.0 - 100.0 fL   MCH 33.9 26.0 - 34.0 pg   MCHC 31.4 30.0 - 36.0 g/dL   RDW 13.5 11.5 - 15.5 %   Platelets 104 (L) 150 - 400 K/uL    Comment: CONSISTENT WITH PREVIOUS RESULT Immature Platelet Fraction may be clinically indicated, consider ordering this additional test [id number] REPEATED TO VERIFY    nRBC 0.0 0.0 - 0.2 %    Comment: Performed at [facility], [place] [address], [place], [name] [address]  Heparin level (unfractionated)     Status: None   Collection Time: 07/15/19  3:41 AM  Result Value Ref Range   Heparin Unfractionated 0.55 0.30 - 0.70 IU/mL    Comment: (NOTE) If heparin results are below expected values, and patient dosage has  been confirmed, suggest follow up testing of antithrombin III levels. Performed at [facility], [place] [address] [place], [place] [address]   POCT Activated clotting time     Status: None   Collection Time: 07/15/19  8:53 AM  Result Value Ref Range   Activated Clotting Time 230 seconds  Basic metabolic panel     Status: Abnormal   Collection Time: 07/16/19  3:58 AM  Result Value Ref Range   Sodium 135 135 - 145 mmol/L   Potassium 6.2 (H) 3.5 - 5.1 mmol/L    Comment: DELTA CHECK NOTED NO VISIBLE HEMOLYSIS    Chloride 90 (L) 98 - 111 mmol/L   CO2 27 22 - 32 mmol/L   Glucose, Bld 90 70 - 99 mg/dL    Comment: Glucose reference range applies only to samples taken after fasting for at least 8 hours.   BUN 58 (H) 8 - 23 mg/dL   Creatinine, Ser 10.51 (H) 0.61 - 1.24 mg/dL   Calcium 9.1 8.9 - 10.3 mg/dL   GFR calc non Af Amer 5 (L) >60 mL/min   GFR calc Af Amer 5 (L) >60 mL/min   Anion gap 18 (H) 5 - 15    Comment: Performed at [place] [address] [place], [place] [address]  Protime-INR      Status: Abnormal   Collection Time: 07/16/19  3:58 AM  Result Value Ref Range   Prothrombin Time 18.0 (H) 11.4 - 15.2 seconds   INR 1.5 (H) 0.8 - 1.2    Comment: (NOTE) INR goal varies based on device and disease states. Performed at [facility], [place] [address], [place], [name] [address]   CBC     Status: Abnormal   Collection Time: 07/16/19  3:58 AM  Result Value Ref Range   WBC 7.4 4.0 - 10.5 K/uL   RBC 2.87 (L) 4.22 - 5.81 MIL/uL   Hemoglobin 9.7 (L) 13.0 - 17.0 g/dL   HCT 30.7 (L) 39.0 - 52.0 %   MCV 107.0 (H) 80.0 - 100.0 fL   MCH 33.8 26.0 - 34.0 pg   MCHC 31.6 30.0 - 36.0 g/dL   RDW 13.4 11.5 - 15.5 %   Platelets 108 (L) 150 - 400 K/uL    Comment: CONSISTENT WITH PREVIOUS RESULT Immature Platelet Fraction may be clinically indicated, consider ordering this additional test [id number]    nRBC 0.0 0.0 - 0.2 %  Comment: Performed at [facility], [place] [address] [place], [name] [address]  Heparin level (unfractionated)     Status: Abnormal   Collection Time: 07/16/19  3:16 PM  Result Value Ref Range   Heparin Unfractionated 0.23 (L) 0.30 - 0.70 IU/mL    Comment: (NOTE) If heparin results are below expected values, and patient dosage has  been confirmed, suggest follow up testing of antithrombin III levels. Performed at [facility], [place] [address], [place], [place] [address]   Basic metabolic panel     Status: Abnormal   Collection Time: 07/17/19  2:59 AM  Result Value Ref Range   Sodium 136 135 - 145 mmol/L   Potassium 4.5 3.5 - 5.1 mmol/L   Chloride 95 (L) 98 - 111 mmol/L   CO2 28 22 - 32 mmol/L   Glucose, Bld 117 (H) 70 - 99 mg/dL    Comment: Glucose reference range applies only to samples taken after fasting for at least 8 hours.   BUN 34 (H) 8 - 23 mg/dL   Creatinine, Ser 7.80 (H) 0.61 - 1.24 mg/dL   Calcium 9.1 8.9 - 10.3 mg/dL   GFR calc non Af Amer 7 (L) >60 mL/min   GFR calc Af Amer 8 (L) >60 mL/min   Anion gap 13 5 - 15     Comment: Performed at [facility] [address], [place], [place] [address]  Protime-INR     Status: Abnormal   Collection Time: 07/17/19  2:59 AM  Result Value Ref Range   Prothrombin Time 21.7 (H) 11.4 - 15.2 seconds   INR 1.9 (H) 0.8 - 1.2    Comment: (NOTE) INR goal varies based on device and disease states. Performed at [facility], [place] [address], [place], [name] [address]   CBC     Status: Abnormal   Collection Time: 07/17/19  2:59 AM  Result Value Ref Range   WBC 6.1 4.0 - 10.5 K/uL   RBC 2.91 (L) 4.22 - 5.81 MIL/uL   Hemoglobin 9.9 (L) 13.0 - 17.0 g/dL   HCT 31.2 (L) 39.0 - 52.0 %   MCV 107.2 (H) 80.0 - 100.0 fL   MCH 34.0 26.0 - 34.0 pg   MCHC 31.7 30.0 - 36.0 g/dL   RDW 13.7 11.5 - 15.5 %   Platelets 101 (L) 150 - 400 K/uL    Comment: SPECIMEN CHECKED FOR CLOTS CONSISTENT WITH PREVIOUS RESULT Immature Platelet Fraction may be clinically indicated, consider ordering this additional test [id number] REPEATED TO VERIFY    nRBC 0.0 0.0 - 0.2 %    Comment: Performed at [facility], [place] [address], [place], [name] [address]  Heparin level (unfractionated)     Status: None   Collection Time: 07/17/19  2:59 AM  Result Value Ref Range   Heparin Unfractionated 0.41 0.30 - 0.70 IU/mL    Comment: (NOTE) If heparin results are below expected values, and patient dosage has  been confirmed, suggest follow up testing of antithrombin III levels. Performed at [facility], [place] [address], [place], [place] [address]   Basic metabolic panel     Status: Abnormal   Collection Time: 07/18/19  3:49 AM  Result Value Ref Range   Sodium 136 135 - 145 mmol/L   Potassium 4.8 3.5 - 5.1 mmol/L   Chloride 93 (L) 98 - 111 mmol/L   CO2 28 22 - 32 mmol/L   Glucose, Bld 108 (  H) 70 - 99 mg/dL    Comment: Glucose reference range applies only to samples taken after fasting for at least 8 hours.   BUN 51 (H) 8 - 23 mg/dL   Creatinine, Ser 10.71 (H) 0.61 - 1.24  mg/dL   Calcium 8.9 8.9 - 10.3 mg/dL   GFR calc non Af Amer 5 (L) >60 mL/min   GFR calc Af Amer 5 (L) >60 mL/min   Anion gap 15 5 - 15    Comment: Performed at [facility] [address] [place], [place] [address]  Protime-INR     Status: Abnormal   Collection Time: 07/18/19  3:49 AM  Result Value Ref Range   Prothrombin Time 23.1 (H) 11.4 - 15.2 seconds   INR 2.1 (H) 0.8 - 1.2    Comment: (NOTE) INR goal varies based on device and disease states. Performed at [facility], [place] [address] [place], [name] [address]   Heparin level (unfractionated)     Status: None   Collection Time: 07/18/19  3:49 AM  Result Value Ref Range   Heparin Unfractionated 0.39 0.30 - 0.70 IU/mL    Comment: (NOTE) If heparin results are below expected values, and patient dosage has  been confirmed, suggest follow up testing of antithrombin III levels. Performed at [facility], [place] [address] [place], [place] [address]   POCT INR     Status: None   Collection Time: 07/20/19 12:00 AM  Result Value Ref Range   INR 2.1 2.0 - 3.0    Comment: results faxed from [facility]  Lab report - scanned     Status: None   Collection Time: 07/20/19 12:00 AM  Result Value Ref Range   INR 2.1   POCT INR     Status: None   Collection Time: 07/21/19 12:00 AM  Result Value Ref Range   INR 2.5 2.0 - 3.0    Comment: self tester  POCT INR     Status: None   Collection Time: 07/25/19 12:00 AM  Result Value Ref Range   INR 2.7 2.0 - 3.0    Comment: results faxed from [facility]  POCT INR     Status: Abnormal   Collection Time: 08/01/19 12:00 AM  Result Value Ref Range   INR 1.8 (A) 2.0 - 3.0    Comment: pt called w/ INR results - home tester  POCT INR     Status: None   Collection Time: 08/08/19 10:59 AM  Result Value Ref Range   INR 2.4 2.0 - 3.0    Radiology CARDIAC CATHETERIZATION  Result Date: 07/15/2019  Prox LAD lesion is 90% stenosed.  Mid LAD-1 lesion is 95% stenosed.  Mid LAD-2 lesion  is 30% stenosed.  Mid LAD to Dist LAD lesion is 60% stenosed.  Prox RCA lesion is 40% stenosed.  Mid RCA lesion is 70% stenosed.  Dist RCA lesion is 99% stenosed with 99% stenosed side branch in RPAV.  Ost RPDA to RPDA lesion is 95% stenosed.  SVG.  Origin lesion is 100% stenosed.  LIMA graft was visualized by angiography.  The graft exhibits no disease.  Origin to Prox Graft lesion is 99% stenosed.  Post intervention, there is a 0% residual stenosis.  A drug-eluting stent was successfully placed using a STENT RESOLUTE ONYX 4.5X15.  1.  Severe underlying two-vessel coronary artery disease including the LAD and right coronary artery.  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  Patent LIMA to LAD and occluded SVG to diagonal.  The diagonal receives collaterals from the distal LAD.  Patent SVG to right PDA with 99% ostial stenosis. 2.  Successful angioplasty and drug-eluting stent placement to SVG to right PDA. 3.  Small hematoma in the right groin noted during the diagnostic portion of the cath with suspecting oozing around the sheath due to heavy calcifications in the common femoral artery.  This improved with upsizing the sheath and manual pressure.  This was stable at the end of the case after deploying a minx closure device and holding additional 10 minutes of pressure. Recommendations: Resume heparin drip 8 hours after sheath pull after making sure that the right groin hematoma is stable. Resume warfarin tonight. The patient was started on clopidogrel and he is currently on low-dose aspirin.  I do not think he will tolerate triple therapy.  I recommend stopping aspirin before hospital discharge. For atrial fibrillation, I switch carvedilol to metoprolol tartrate 25 mg twice daily.    Assessment/Plan Hyperlipidemia lipid control important in reducing the progression of atherosclerotic disease. Continue statin therapy   ESRD on dialysis  [facility]) Certainly slows his wound healing.  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  CAD (coronary artery disease) The patient has history of coronary bypass and now is having more cardiac issues.    Recent cardiac cath.  Had intervention at that time.  This was done from the right groin approach which she is very pleased that we will not have to go into the right groin because it is still quite sore.  He does feel better after intervention  Essential hypertension blood pressure control important in reducing the progression of atherosclerotic disease. On appropriate oral medications.   Atherosclerosis of native arteries of the extremities with ulceration ([place]) The patient has known significant peripheral arterial disease.  We have held on performing his angiogram until he got his cardiac situation squared away.  Now that he has had his cardiac intervention, I think we need to proceed with angiogram and possible revascularization.  The wound has stalled out although it is better than it was a few months ago.  With the persistent wound in a dialysis patient with known peripheral arterial disease, this is clearly a limb threatening situation and angiogram is indicated.  Risks and benefits of the procedure were discussed with the patient he is agreeable to proceed.    Leotis Pain, MD  08/09/2019 9:51 AM    This note was created with Dragon medical transcription system.  Any errors from dictation are purely unintentional

## 2019-08-10 ENCOUNTER — Encounter (INDEPENDENT_AMBULATORY_CARE_PROVIDER_SITE_OTHER): Payer: Self-pay

## 2019-08-11 ENCOUNTER — Encounter: Payer: Medicare Other | Attending: Cardiovascular Disease | Admitting: *Deleted

## 2019-08-11 ENCOUNTER — Other Ambulatory Visit: Payer: Self-pay

## 2019-08-11 DIAGNOSIS — Z955 Presence of coronary angioplasty implant and graft: Secondary | ICD-10-CM | POA: Insufficient documentation

## 2019-08-11 DIAGNOSIS — Z951 Presence of aortocoronary bypass graft: Secondary | ICD-10-CM | POA: Insufficient documentation

## 2019-08-11 NOTE — Progress Notes (Signed)
Daily Session Note  Patient Details  Name: Jeremy [name] MRN: [id number] Date of Birth: [birth year] Referring Provider:     Cardiac Rehab from 08/02/2019 in [facility] Cardiac and Pulmonary Rehab  Referring Provider  Kathlyn Sacramento MD      Encounter Date: 08/11/2019  Check In: Session Check In - 08/11/19 1054      Check-In   Supervising physician immediately available to respond to emergencies  See telemetry face sheet for immediately available ER MD    Location  [facility]-Cardiac & Pulmonary Rehab    Staff Present  Renita Papa, RN BSN;Melissa Caiola RDN, Rowe Pavy, BA, ACSM CEP, Exercise Physiologist    Virtual Visit  No    Medication changes reported      No    Fall or balance concerns reported     No    Warm-up and Cool-down  Performed on first and last piece of equipment    Resistance Training Performed  Yes    VAD Patient?  No    PAD/SET Patient?  No      Pain Assessment   Currently in Pain?  No/denies          Social History   Tobacco Use  Smoking Status Never Smoker  Smokeless Tobacco Never Used    Goals Met:  Independence with exercise equipment Exercise tolerated well No report of cardiac concerns or symptoms Strength training completed today  Goals Unmet:  Not Applicable  Comments: First full day of exercise!  Patient was oriented to gym and equipment including functions, settings, policies, and procedures.  Patient's individual exercise prescription and treatment plan were reviewed.  All starting workloads were established based on the results of the 6 minute walk test done at initial orientation visit.  The plan for exercise progression was also introduced and progression will be customized based on patient's performance and goals.    Dr. Emily Filbert is Medical Director for [facility] and [facility].

## 2019-08-15 ENCOUNTER — Telehealth: Payer: Self-pay | Admitting: Physician Assistant

## 2019-08-15 ENCOUNTER — Ambulatory Visit (INDEPENDENT_AMBULATORY_CARE_PROVIDER_SITE_OTHER): Payer: Medicare Other

## 2019-08-15 ENCOUNTER — Telehealth (INDEPENDENT_AMBULATORY_CARE_PROVIDER_SITE_OTHER): Payer: Self-pay

## 2019-08-15 DIAGNOSIS — Z952 Presence of prosthetic heart valve: Secondary | ICD-10-CM | POA: Diagnosis not present

## 2019-08-15 DIAGNOSIS — I4821 Permanent atrial fibrillation: Secondary | ICD-10-CM | POA: Diagnosis not present

## 2019-08-15 DIAGNOSIS — Z5181 Encounter for therapeutic drug level monitoring: Secondary | ICD-10-CM | POA: Diagnosis not present

## 2019-08-15 LAB — POCT INR: INR: 2.6 (ref 2.0–3.0)

## 2019-08-15 NOTE — Patient Instructions (Signed)
Sent pt MyChart message advising him to continue warfarin dose of 1mg  daily. Recheck INR in 1 week.

## 2019-08-15 NOTE — Telephone Encounter (Signed)
I attempted to contact the patient and a message was left for a return call. 

## 2019-08-15 NOTE — Telephone Encounter (Signed)
Primary Cardiologist: Kathlyn Sacramento, MD  I am coming on coverage of pre-op box today. This patient appears to have had a chart sent outside the normal Telephone Call box, under MyChart Messages so not clear that team saw this last week.   Wellington Hampshire, MD  Parris-Godley, Izell Floris, RN; Cv Div Preop Pharm; Algernon Huxley, MD 4 days ago   This is overall a difficult situation. The patient has mechanical aortic valve and also has atrial fibrillation. This kind of situation usually requires bridging with heparin. However, given that he is on dialysis, Lovenox is not the best option and might be associated with increased risk of bleeding due to accumulation. In the past, we have admitted him to the hospital 2 to 3 days before procedures and bridged him with unfractionated heparin drip. That is definitely an option. At the same time, given that his mechanical aortic valve is aortic, it is possible to just hold warfarin for 3 days and not bridge him at all as long as the patient understands there is a slight increase in risk of thromboembolic complications.  I am forwarding this to Dr. Lucky Cowboy to keep him in the loop.   Message text    Rodriguez-Guzman, Raquel, RPH-CPP  Parris-Godley, Izell Lehigh, RN; Cv Div Preop 4 days ago   Nicole, Zvi D  [name], Muhammad A, MD 6 days ago   Dr. [name],  I have an angiography of the lower extremity scheduled for May 13th.  The Doctor will be Leotis Pain. He wants me to stop my  Warnifin for 3 days before the surgery.  I know this is a issue with my  heart valve.  Please discuss with Dr. Lucky Cowboy if this is a problem and let me know what to do. Thank you Elicia Lamp DOB 06-May-2055     I since see that patient has reached out to Dr. Lucky Cowboy separately in [facility] [place] Messages:   Lucky Cowboy, Erskine Squibb, MD to [place], Ouch    08/10/19 10:48 AM Mr Quach, It is probably easier just to take the Coumadin up until the evening before the angiogram. I don't know the date of the procedure yet, but if  the procedure is on a Monday just hold Sunday nights dose and we will be giving you heparin Monday and resuming Coumadin Monday night.   Last read by Elicia Lamp at 9:42 AM on 08/15/2019.      08/10/19 9:44 AM [place]ster, April, CMA routed this conversation to [facility] Provider Pool  Tiburcio Pea, Veronia Beets to Algernon Huxley, MD     08/10/19 9:36 AM Dr. Lucky Cowboy, I was concerned with stopping my warfarin the 3 days before next weeks angiography.  I know that I usually have to go on a herparin drip before other surgeries.  I called the Cardio Dept and they need a clearance letter from your office per the reception desk.  I would feel better if you would talk to Dr. [name] before stopping the warfarin.  Just to avoid any conplications before hand. Thank you DOB 06-May-2055 [phone]     Procedure now appears to be moved to Thursday 08/25/19. Will route message to Dr. [name] to find out if there has been consensus on the plan, and relay if he feels he's medically cleared as well given recent OV. Dr. [name] -- Please route response to P CV DIV PREOP (the pre-op pool). Thank you.    Charlie Pitter, PA-C 08/15/2019, 12:18 PM

## 2019-08-15 NOTE — Telephone Encounter (Signed)
Moving conversation into phone note format for pre-op tracking.

## 2019-08-16 ENCOUNTER — Other Ambulatory Visit: Payer: Self-pay

## 2019-08-16 ENCOUNTER — Other Ambulatory Visit: Payer: Medicare Other

## 2019-08-16 ENCOUNTER — Encounter: Payer: Medicare Other | Admitting: *Deleted

## 2019-08-16 ENCOUNTER — Encounter (INDEPENDENT_AMBULATORY_CARE_PROVIDER_SITE_OTHER): Payer: Self-pay

## 2019-08-16 DIAGNOSIS — Z955 Presence of coronary angioplasty implant and graft: Secondary | ICD-10-CM

## 2019-08-16 NOTE — Progress Notes (Signed)
Daily Session Note  Patient Details  Name: Jeremy [name] MRN: [id number] Date of Birth: 1955/11/09 Referring Provider:     Cardiac Rehab from 08/02/2019 in [facility] Cardiac and Pulmonary Rehab  Referring Provider  Kathlyn Sacramento MD      Encounter Date: 08/16/2019  Check In: Session Check In - 08/16/19 1015      Check-In   Supervising physician immediately available to respond to emergencies  See telemetry face sheet for immediately available ER MD    Location  [facility]-Cardiac & Pulmonary Rehab    Staff Present  Heath Lark, RN, BSN, CCRP;Amanda Sommer, BA, ACSM CEP, Exercise Physiologist;Joseph Hood RCP,RRT,BSRT    Virtual Visit  No    Medication changes reported      No    Fall or balance concerns reported     No    Warm-up and Cool-down  Performed on first and last piece of equipment    Resistance Training Performed  Yes    VAD Patient?  No    PAD/SET Patient?  No      Pain Assessment   Currently in Pain?  No/denies          Social History   Tobacco Use  Smoking Status Never Smoker  Smokeless Tobacco Never Used    Goals Met:  Independence with exercise equipment Exercise tolerated well No report of cardiac concerns or symptoms  Goals Unmet:  Not Applicable  Comments: Pt able to follow exercise prescription today without complaint.  Will continue to monitor for progression.    Dr. Emily Filbert is Medical Director for [facility] and [facility].

## 2019-08-16 NOTE — Telephone Encounter (Signed)
Spoke with the patient to let him know that his angio procedure has been rescheduled to 08/25/19 with a 9:15 am arrival time to the [facility]. Patient will do covid testing on 08/23/19 between 8-1 pm at the [facility]. Pre-procedure instructions will be mailed to the patient.

## 2019-08-17 ENCOUNTER — Other Ambulatory Visit (INDEPENDENT_AMBULATORY_CARE_PROVIDER_SITE_OTHER): Payer: Self-pay | Admitting: Nurse Practitioner

## 2019-08-18 ENCOUNTER — Encounter: Payer: Medicare Other | Admitting: *Deleted

## 2019-08-18 ENCOUNTER — Other Ambulatory Visit: Payer: Self-pay

## 2019-08-18 DIAGNOSIS — Z955 Presence of coronary angioplasty implant and graft: Secondary | ICD-10-CM

## 2019-08-18 DIAGNOSIS — Z951 Presence of aortocoronary bypass graft: Secondary | ICD-10-CM

## 2019-08-18 NOTE — Telephone Encounter (Signed)
Clearance still in my inbox from origination date 5/10. Since procedure date is approaching soon, will re-route to cardiologist and pre-op team to follow-up. Jonesha Tsuchiya PA-C

## 2019-08-18 NOTE — Progress Notes (Signed)
Daily Session Note  Patient Details  Name: Jeremy [name] MRN: [id number] Date of Birth: 03/07/1956 Referring Provider:     Cardiac Rehab from 08/02/2019 in [facility] Cardiac and Pulmonary Rehab  Referring Provider  Kathlyn Sacramento MD      Encounter Date: 08/18/2019  Check In: Session Check In - 08/18/19 1028      Check-In   Supervising physician immediately available to respond to emergencies  See telemetry face sheet for immediately available ER MD    Location  [facility]-Cardiac & Pulmonary Rehab    Staff Present  Hope Budds RDN, LDN;Jessica [place], MA, RCEP, CCRP, CCET;Amanda Sommer, BA, ACSM CEP, Exercise Physiologist;Cleta Heatley Sherryll Burger, RN BSN    Virtual Visit  No    Medication changes reported      No    Fall or balance concerns reported     No    Warm-up and Cool-down  Performed on first and last piece of equipment    Resistance Training Performed  Yes    VAD Patient?  No    PAD/SET Patient?  No      Pain Assessment   Currently in Pain?  No/denies          Social History   Tobacco Use  Smoking Status Never Smoker  Smokeless Tobacco Never Used    Goals Met:  Independence with exercise equipment Exercise tolerated well No report of cardiac concerns or symptoms Strength training completed today  Goals Unmet:  Not Applicable  Comments: Pt able to follow exercise prescription today without complaint.  Will continue to monitor for progression.    Dr. Emily Filbert is Medical Director for [facility] and [facility].

## 2019-08-19 ENCOUNTER — Ambulatory Visit (INDEPENDENT_AMBULATORY_CARE_PROVIDER_SITE_OTHER): Payer: Medicare Other

## 2019-08-19 VITALS — BP 133/90 | HR 97 | Ht 69.9 in | Wt 187.0 lb

## 2019-08-19 DIAGNOSIS — Z0000 Encounter for general adult medical examination without abnormal findings: Secondary | ICD-10-CM | POA: Diagnosis not present

## 2019-08-19 NOTE — Progress Notes (Signed)
Subjective:   Jeremy [name] is a 64 y.o. male who presents for Medicare Annual/Subsequent preventive examination.  Review of Systems:  No ROS.  Medicare Wellness Virtual Visit.  Visual/audio telehealth visit.   Vital signs provided by patient. See social history for additional risk factors.   Cardiac Risk Factors include: advanced age (>44men, >65 women);male gender     Objective:    Vitals: BP 133/90 (BP Location: Right Arm, Patient Position: Sitting, Cuff Size: Normal)   Pulse 97   Ht 5' 9.9" (1.775 m)   Wt 187 lb (84.8 kg)   BMI 26.91 kg/m   Body mass index is 26.91 kg/m.  Advanced Directives 08/19/2019 07/28/2019 07/13/2019 03/15/2019 03/08/2019 11/29/2018 11/04/2018  Does Patient Have a Medical Advance Directive? No Yes No No No No No  Does patient want to make changes to medical advance directive? - No - Patient declined - No - Patient declined No - Patient declined Yes (MAU/Ambulatory/Procedural Areas - Information given) -  Would patient like information on creating a medical advance directive? Yes (MAU/Ambulatory/Procedural Areas - Information given) No - Patient declined No - Patient declined No - Patient declined No - Patient declined - No - Patient declined    Tobacco Social History   Tobacco Use  Smoking Status Never Smoker  Smokeless Tobacco Never Used     Counseling given: Not Answered   Clinical Intake:  Pre-visit preparation completed: Yes        Diabetes: No  How often do you need to have someone help you when you read instructions, pamphlets, or other written materials from your doctor or pharmacy?: 1 - Never  Interpreter Needed?: No     Past Medical History:  Diagnosis Date  . Abnormal stress test 03/19/2018  . 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 Aortic stenosis    a. 2014 s/p mech AVR ([facility])-->chronic coumadin; b. 08/2018 Echo: Mild AS/AI. Nl fxn/gradient.  . Arthritis   . Atrial fibrillation ([place])   . CAD (coronary artery disease)     a. 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 02/2018 MV [facility]): large inf, infsept, inflat infarct w/ min rev, EF 30%; c. 06/2018 Cath: LM min irregs, LAD 60p, 95/30/28m, LCX min irregs, RCA 40p, 76m, 90d, RPAV 90, RPDA 95ost; c. 09/2018 CABGx3 (LIMA->LAD, VG->D1, VG->PDA).  . Chronic anticoagulation 06/2012   a. Coumadin in the setting of mech AVR and afib.  Marland Kitchen COVID-19 03/15/2019  . CPAP (continuous positive airway pressure) dependence   . Dialysis patient [facility]) 04/07/2006   Patient started HD around 1996,  had 1st transplant LLQ around 1998 until 2007 when they found renal cell cancer in transplant and both native kidneys, all were removed > went back on HD until 2nd transplant RLQ in 2014 which lasted 3 yrs; it was embolized for suspected acute rejection. Is currently on HD MWF in [place] w/ [facility] nephrology.  . ESRD (end stage renal disease) ([place])    a. CKD 2/2 to IgA nephropathy (dx age 4); b. 1999 s/p Kidney transplant; c. 2014 Bilat nephrectomy (transplanted kidney resected) in the setting of renal cell carcinoma; d. PD until 11/2017-->HD.  Marland Kitchen Heart murmur   . HFrEF (heart failure with reduced ejection fraction) ([place])    a. 07/2016 Echo: EF 55-60%; b. 02/2018 MV: EF 30%; c. 04/2018 Echo: EF 35%; d. 08/2018 Echo: EF 30-35%. Glob HK w/o rwma. Mildly reduced RV fxn.  Mod to sev dil LA. Nl AoV fxn.  Marland Kitchen Hx of colonoscopy    2009 by Dr. Laural Golden. Normal except for small submucosal lipoma. No evidence of polyps or colitis.  . Hyperlipidemia   . Hypertension   . Hypogonadism in male 07/25/2015  . Ischemic cardiomyopathy    a. 07/2016 Echo: EF 55-60%; b. 02/2018 MV: EF 30%; c. 04/2018 Echo: EF 35%; d. 08/2018 Echo: EF 30-35%.  . OSA (obstructive sleep apnea)    No CPAP  . Permanent atrial fibrillation ([facility])    a. CHA2DS2VASc = 3-->Coumadin (also mech AVR); b. 06/2018 Amio started 2/2 elevated rates.  . Renal cell cancer ([place])   . Renal cell  carcinoma ([place]) 08/04/2016   Past Surgical History:  Procedure Laterality Date  . AORTIC VALVE REPLACEMENT  3 /11 /2014   At [place]    . CAPD INSERTION N/A 02/19/2017   Procedure: LAPAROSCOPIC INSERTION CONTINUOUS AMBULATORY PERITONEAL DIALYSIS  (CAPD) CATHETER;  Surgeon: Algernon Huxley, MD;  Location: [facility];  Service: General;  Laterality: N/A;  . CORONARY ANGIOPLASTY WITH STENT PLACEMENT    . CORONARY ARTERY BYPASS GRAFT N/A 09/16/2018   Procedure: CORONARY ARTERY BYPASS GRAFT times three      with left internal mammary artery and endoharvest of right leg greater saphenous vein;  Surgeon: Melrose Nakayama, MD;  Location: [place];  Service: Open Heart Surgery;  Laterality: N/A;  . CORONARY STENT INTERVENTION N/A 07/15/2019   Procedure: CORONARY STENT INTERVENTION;  Surgeon: Wellington Hampshire, MD;  Location: [place] [facility];  Service: Cardiovascular;  Laterality: N/A;  . DG AV DIALYSIS  SHUNT ACCESS EXIST*L* OR     left arm  . EPIGASTRIC HERNIA REPAIR N/A 02/19/2017   Procedure: HERNIA REPAIR EPIGASTRIC ADULT;  Surgeon: Robert Bellow, MD;  Location: [facility];  Service: General;  Laterality: N/A;  . EYE SURGERY     bilateral cataract  . HERNIA REPAIR     UMBILICAL HERNIA  . HERNIA REPAIR  02/19/2017  . INNER EAR SURGERY     20 years ago  . KIDNEY TRANSPLANT    . LEFT HEART CATH AND CORS/GRAFTS ANGIOGRAPHY N/A 07/15/2019   Procedure: LEFT HEART CATH AND CORS/GRAFTS ANGIOGRAPHY;  Surgeon: Wellington Hampshire, MD;  Location: [place] [facility];  Service: Cardiovascular;  Laterality: N/A;  . Peritoneal Dialysis access placement  02/19/2017  . REMOVAL OF A DIALYSIS CATHETER N/A 11/23/2017   Procedure: REMOVAL OF A PERITONEAL DIALYSIS CATHETER;  Surgeon: Algernon Huxley, MD;  Location: [facility];  Service: Vascular;  Laterality: N/A;  . RIGHT/LEFT HEART CATH AND CORONARY ANGIOGRAPHY Bilateral 06/07/2018   Procedure: RIGHT/LEFT HEART CATH AND  CORONARY ANGIOGRAPHY;  Surgeon: Wellington Hampshire, MD;  Location: [place] [facility];  Service: Cardiovascular;  Laterality: Bilateral;  . TEE WITHOUT CARDIOVERSION N/A 07/21/2016   Procedure: TRANSESOPHAGEAL ECHOCARDIOGRAM (TEE);  Surgeon: Wellington Hampshire, MD;  Location: [facility];  Service: Cardiovascular;  Laterality: N/A;  . TEE WITHOUT CARDIOVERSION N/A 09/16/2018   Procedure: TRANSESOPHAGEAL ECHOCARDIOGRAM (TEE);  Surgeon: Melrose Nakayama, MD;  Location: [place];  Service: Open Heart Surgery;  Laterality: N/A;   Family History  Problem Relation Age of Onset  . Cancer Mother        pancreatic  . Heart disease Father   . Diabetes Father    Social History   Socioeconomic History  . Marital status: Divorced    Spouse name: Not  on file  . Number of children: Not on file  . Years of education: Not on file  . Highest education level: High school graduate  Occupational History  . 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 Smoking status: Never Smoker  . Smokeless tobacco: Never Used  Substance and Sexual Activity  . Alcohol use: No  . Drug use: No  . Sexual activity: Not Currently    Partners: Female  Other Topics Concern  . Not on file  Social History Narrative   Single   1 daughter 20    Social Determinants of Health   Financial Resource Strain:   . Difficulty of Paying Living Expenses:   Food Insecurity:   . Worried About Charity fundraiser in the Last Year:   . Arboriculturist in the Last Year:   Transportation Needs:   . Film/video editor (Medical):   Marland Kitchen Lack of Transportation (Non-Medical):   Physical Activity:   . Days of Exercise per Week:   . Minutes of Exercise per Session:   Stress:   . Feeling of Stress :   Social Connections:   . Frequency of Communication with Friends and Family:   . Frequency of Social Gatherings with Friends and Family:   . Attends Religious Services:   . Active Member of Clubs or Organizations:   .  Attends Archivist Meetings:   Marland Kitchen Marital Status:     Outpatient Encounter Medications as of 08/19/2019  Medication Sig  . acetaminophen (TYLENOL) 500 MG tablet Take 1,000 mg 2 (two) times daily as needed by mouth for moderate pain.  Marland Kitchen allopurinol (ZYLOPRIM) 300 MG tablet Take 300 mg by mouth daily.  . cinacalcet (SENSIPAR) 30 MG tablet Take 30 mg by mouth every other day.   . clopidogrel (PLAVIX) 75 MG tablet Take 1 tablet (75 mg total) by mouth daily with breakfast.  . ezetimibe (ZETIA) 10 MG tablet Take 1 tablet (10 mg total) by mouth daily.  Marland Kitchen lidocaine-prilocaine (EMLA) cream Apply 1 application topically as needed (for fistula).  . loratadine (CLARITIN) 10 MG tablet Take 10 mg by mouth daily.  . metoprolol tartrate (LOPRESSOR) 25 MG tablet Take 1 tablet (25 mg total) by mouth in the morning, at noon, and at bedtime.  . multivitamin (RENA-VIT) TABS tablet Take 1 tablet by mouth daily.  . nitroGLYCERIN (NITROSTAT) 0.4 MG SL tablet Place 1 tablet (0.4 mg total) under the tongue every 5 (five) minutes as needed for chest pain.  . pantoprazole (PROTONIX) 40 MG tablet Take 1 tablet (40 mg total) by mouth daily.  . predniSONE (DELTASONE) 5 MG tablet Take 5 mg by mouth daily with breakfast.   . rosuvastatin (CRESTOR) 40 MG tablet Take 1 tablet (40 mg total) by mouth daily at 6 PM.  . sevelamer carbonate (RENVELA) 800 MG tablet Take 4,000 mg by mouth 3 (three) times daily with meals. 5 tabs per meal   . warfarin (COUMADIN) 1 MG tablet Take 1-1.5 tablets (1-1.5 mg total) by mouth See admin instructions. Take 1mg  on Sun, Tues, Thur and Sat Take 1.5mg  on Mon, Wed, Fri   No facility-administered encounter medications on file as of 08/19/2019.    Activities of Daily Living In your present state of health, do you have any difficulty performing the following activities: 08/19/2019 07/13/2019  Hearing? N -  Vision? N -  Difficulty concentrating or making decisions? N -  Walking or climbing  stairs?  N -  Comment Leg weakness -  Dressing or bathing? N -  Doing errands, shopping? N Y  Conservation officer, nature and eating ? N -  Using the Toilet? N -  In the past six months, have you accidently leaked urine? N -  Do you have problems with loss of bowel control? N -  Managing your Medications? N -  Managing your Finances? N -  Housekeeping or managing your Housekeeping? N -  Some recent data might be hidden    Patient Care Team: Leone Haven, MD as PCP - General (Family Medicine) Wellington Hampshire, MD as PCP - Cardiology (Cardiology) Warden Fillers, MD as Consulting Physician (Ophthalmology) Whitney Muse, Kelby Fam, MD (Inactive) as Consulting Physician (Hematology and Oncology) Lucky Cowboy Erskine Squibb, MD as Referring Physician (Vascular Surgery) Robert Bellow, MD (General Surgery)   Assessment:   This is a routine wellness examination for [place].  Nurse connected with patient 08/19/19 at  9:00 AM EDT by a telephone enabled telemedicine application, patient has no video access or difficulty with video and verified that I am speaking with the correct person using two identifiers. Patient stated full name and DOB. Patient gave permission to continue with telephone visit. Patient's location was at home and Nurse's location was at [place] office.   Patient is alert and oriented x3. Patient denies difficulty focusing or concentrating.  Health Maintenance Due: -Tdap vaccine- discussed; to be completed with doctor in visit or local pharmacy.   See completed HM at the end of note.   Dental: Visits every 6 months.    Hearing: Demonstrates normal hearing during visit.  Safety:  Patient feels safe at home- yes Patient does have smoke detectors at home- yes Patient does wear sunscreen or protective clothing when in direct sunlight - yes Patient does wear seat belt when in a moving vehicle - yes Patient drives- yes Adequate lighting in walkways free from debris- yes Grab bars and  handrails used as appropriate- yes Ambulates with an assistive device- no Cell phone on person when ambulating outside of the home-yes  Social: Alcohol intake - no   Smoking history- never   Smokers in home? none Illicit drug use? none  Medication: Taking as directed and without issues.  Pill box in use -yes Self managed - yes   Covid-19: Precautions and sickness symptoms discussed. Wears mask, social distancing, hand hygiene as appropriate.   Activities of Daily Living Patient denies needing assistance with: household chores, feeding themselves, getting from bed to chair, getting to the toilet, bathing/showering, dressing, managing money, or preparing meals.   Discussed the importance of a healthy diet, water intake and the benefits of aerobic exercise.   Physical activity- Active around the home. Cardio rehab 2 days weekly, 60 minutes.   Diet:  Heart healthy Fluid restriction: 32 ounces Caffeine: 3 cups of coffee weekly  Other Providers Patient Care Team: Leone Haven, MD as PCP - General (Family Medicine) Wellington Hampshire, MD as PCP - Cardiology (Cardiology) Warden Fillers, MD as Consulting Physician (Ophthalmology) Whitney Muse, Kelby Fam, MD (Inactive) as Consulting Physician (Hematology and Oncology) Lucky Cowboy Erskine Squibb, MD as Referring Physician (Vascular Surgery) Robert Bellow, MD (General Surgery) Exercise Activities and Dietary recommendations Current Exercise Habits: Home exercise routine, Time (Minutes): 60, Frequency (Times/Week): 2, Weekly Exercise (Minutes/Week): 120, Intensity: Mild  Goals    . Exercise 3x per week (30 min per time)     Try to walk for 30 minutes 3 times a week as  tolerated       Fall Risk Fall Risk  08/19/2019 07/28/2019 05/25/2019 11/17/2018 11/04/2018  Falls in the past year? 0 0 0 0 0  Number falls in past yr: - 0 0 0 -  Injury with Fall? - 0 - - -  Risk for fall due to : - Impaired balance/gait - - -  Follow up Falls evaluation  completed Falls evaluation completed;Education provided;Falls prevention discussed Falls evaluation completed - -   Timed Get Up and Go Performed: no, virtual visit  Depression Screen PHQ 2/9 Scores 08/19/2019 08/02/2019 05/25/2019 03/23/2019  PHQ - 2 Score 2 4 0 0  PHQ- 9 Score 4 10 - -    Cognitive Function MMSE - Mini Mental State Exam 03/25/2016  Orientation to time 5  Orientation to Place 5  Registration 3  Attention/ Calculation 5  Recall 3  Language- name 2 objects 2  Language- repeat 1  Language- follow 3 step command 3  Language- read & follow direction 1  Write a sentence 1  Copy design 1  Total score 30     6CIT Screen 08/19/2019 03/19/2018  What Year? 0 points 0 points  What month? 0 points 0 points  What time? 0 points 0 points  Count back from 20 - 0 points  Months in reverse 0 points 0 points  Repeat phrase - 0 points  Total Score - 0    Immunization History  Administered Date(s) Administered  . Hepatitis B, adult 11/29/2015, 12/29/2015, 10/13/2016, 11/12/2016, 12/24/2016, 04/30/2017, 07/16/2018, 08/11/2018, 09/10/2018, 12/08/2018  . Influenza,inj,Quad PF,6+ Mos 01/03/2019  . Influenza,inj,quad, With Preservative 01/04/2018  . Influenza-Unspecified 04/20/2013, 01/09/2014, 01/04/2015, 12/25/2015  . PFIZER SARS-COV-2 Vaccination 06/17/2019  . Pneumococcal Conjugate-13 03/25/2017  . Pneumococcal Polysaccharide-23 01/01/2016  . Td 04/20/2006   Screening Tests Health Maintenance  Topic Date Due  . TETANUS/TDAP  04/08/2019  . COVID-19 Vaccine (2 - Pfizer 2-dose series) 07/08/2019  . INFLUENZA VACCINE  11/06/2019  . COLONOSCOPY  11/26/2024  . Hepatitis C Screening  Completed  . HIV Screening  Completed       Plan:   Keep all routine maintenance appointments.   08/23/19 mychart video follow up. Considering getting back on prozac.   End of life planning; Advanced aging; Advanced directives discussed.  No HCPOA/Living Will.  Additional information  mailed per request to help them start the conversation with family.    Medicare Attestation I have personally reviewed: The patient's medical and social history Their use of alcohol, tobacco or illicit drugs Their current medications and supplements The patient's functional ability including ADLs,fall risks, home safety risks, cognitive, and hearing and visual impairment Diet and physical activities Evidence for depression   I have reviewed and discussed with patient certain preventive protocols, quality metrics, and best practice recommendations.      Varney Biles, LPN  08/01/621

## 2019-08-19 NOTE — Patient Instructions (Addendum)
  Mr. Mier , Thank you for taking time to come for your Medicare Wellness Visit. I appreciate your ongoing commitment to your health goals. Please review the following plan we discussed and let me know if I can assist you in the future.   These are the goals we discussed: Goals    . Exercise 3x per week (30 min per time)     Try to walk for 30 minutes 3 times a week as tolerated       This is a list of the screening recommended for you and due dates:  Health Maintenance  Topic Date Due  . Tetanus Vaccine  04/08/2019  . COVID-19 Vaccine (2 - Pfizer 2-dose series) 07/08/2019  . Flu Shot  11/06/2019  . Colon Cancer Screening  11/26/2024  .  Hepatitis C: One time screening is recommended by Center for Disease Control  (CDC) for  adults born from [birth year] through 1964.   Completed  . HIV Screening  Completed

## 2019-08-19 NOTE — Progress Notes (Signed)
I have reviewed the above note and agree.  Yves Fodor, M.D.  

## 2019-08-22 ENCOUNTER — Encounter: Payer: Self-pay | Admitting: Cardiovascular Disease

## 2019-08-22 LAB — POCT INR: INR: 2.6 (ref 2.0–3.0)

## 2019-08-23 ENCOUNTER — Ambulatory Visit (INDEPENDENT_AMBULATORY_CARE_PROVIDER_SITE_OTHER): Payer: Medicare Other | Admitting: Cardiology

## 2019-08-23 ENCOUNTER — Other Ambulatory Visit
Admission: RE | Admit: 2019-08-23 | Discharge: 2019-08-23 | Disposition: A | Discharge status: Home / Self Care | Payer: Medicare Other | Source: Ambulatory Visit | Attending: Vascular Surgery | Admitting: Vascular Surgery

## 2019-08-23 ENCOUNTER — Other Ambulatory Visit: Payer: Self-pay

## 2019-08-23 ENCOUNTER — Telehealth (INDEPENDENT_AMBULATORY_CARE_PROVIDER_SITE_OTHER): Payer: Medicare Other | Admitting: Family Medicine

## 2019-08-23 ENCOUNTER — Encounter: Payer: Self-pay | Admitting: Family Medicine

## 2019-08-23 ENCOUNTER — Encounter: Payer: Medicare Other | Admitting: *Deleted

## 2019-08-23 DIAGNOSIS — I10 Essential (primary) hypertension: Secondary | ICD-10-CM | POA: Diagnosis not present

## 2019-08-23 DIAGNOSIS — F320 Major depressive disorder, single episode, mild: Secondary | ICD-10-CM | POA: Diagnosis not present

## 2019-08-23 DIAGNOSIS — I4821 Permanent atrial fibrillation: Secondary | ICD-10-CM

## 2019-08-23 DIAGNOSIS — N186 End stage renal disease: Secondary | ICD-10-CM

## 2019-08-23 DIAGNOSIS — Z955 Presence of coronary angioplasty implant and graft: Secondary | ICD-10-CM

## 2019-08-23 DIAGNOSIS — I7025 Atherosclerosis of native arteries of other extremities with ulceration: Secondary | ICD-10-CM | POA: Diagnosis not present

## 2019-08-23 DIAGNOSIS — Z20822 Contact with and (suspected) exposure to covid-19: Secondary | ICD-10-CM | POA: Insufficient documentation

## 2019-08-23 DIAGNOSIS — Z952 Presence of prosthetic heart valve: Secondary | ICD-10-CM

## 2019-08-23 DIAGNOSIS — Z951 Presence of aortocoronary bypass graft: Secondary | ICD-10-CM

## 2019-08-23 DIAGNOSIS — Z01812 Encounter for preprocedural laboratory examination: Secondary | ICD-10-CM | POA: Diagnosis present

## 2019-08-23 LAB — SARS CORONAVIRUS 2 (TAT 6-24 HRS): SARS Coronavirus 2: NEGATIVE

## 2019-08-23 MED ORDER — FLUOXETINE HCL 20 MG PO TABS: 20.0000 mg | ORAL_TABLET | Freq: Every day | ORAL | 3 refills | Status: DC

## 2019-08-23 NOTE — Progress Notes (Signed)
Virtual Visit via video Note  This visit type was conducted due to national recommendations for restrictions regarding the COVID-19 pandemic (e.g. social distancing).  This format is felt to be most appropriate for this patient at this time.  All issues noted in this document were discussed and addressed.  No physical exam was performed (except for noted visual exam findings with Video Visits).   I connected with Linden Dolin today at 12:30 PM EDT by a video enabled telemedicine application and verified that I am speaking with the correct person using two identifiers. Location patient: home Location provider: work  Persons participating in the virtual visit: patient, provider, Elijio Miles (girlfriend)  I discussed the limitations, risks, security and privacy concerns of performing an evaluation and management service by telephone and the availability of in person appointments. I also discussed with the patient that there may be a patient responsible charge related to this service. The patient expressed understanding and agreed to proceed.   Reason for visit: f/u.  HPI: Depression: Patient notes this is crept back in a little bit.  It is related to an accumulation of his health issues.  He had CABG and then subsequently had a stent about a month ago.  He is also being worked up for PAD.  He notes no SI.  He wants to restart Prozac.  Hypertension: Typically 120s-130s over 60s no chest pain or shortness of breath.  Has been doing cardiac rehab with good benefit.  Continues on metoprolol.  ESRD: Continues with dialysis Monday Wednesday Friday.   ROS: See pertinent positives and negatives per HPI.  Past Medical History:  Diagnosis Date  . Abnormal stress test 03/19/2018  . 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 Aortic stenosis    a. 2014 s/p mech AVR ([facility])-->chronic coumadin; b. 08/2018 Echo: Mild AS/AI. Nl fxn/gradient.  . Arthritis   . Atrial fibrillation ([place])   . CAD  (coronary artery disease)    a. 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 02/2018 MV [facility]): large inf, infsept, inflat infarct w/ min rev, EF 30%; c. 06/2018 Cath: LM min irregs, LAD 60p, 95/30/72m, LCX min irregs, RCA 40p, 85m, 90d, RPAV 90, RPDA 95ost; c. 09/2018 CABGx3 (LIMA->LAD, VG->D1, VG->PDA).  . Chronic anticoagulation 06/2012   a. Coumadin in the setting of mech AVR and afib.  Marland Kitchen COVID-19 03/15/2019  . CPAP (continuous positive airway pressure) dependence   . Dialysis patient [facility]) 04/07/2006   Patient started HD around 1996,  had 1st transplant LLQ around 1998 until 2007 when they found renal cell cancer in transplant and both native kidneys, all were removed > went back on HD until 2nd transplant RLQ in 2014 which lasted 3 yrs; it was embolized for suspected acute rejection. Is currently on HD MWF in [place] w/ [facility] nephrology.  . ESRD (end stage renal disease) ([place])    a. 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 1999 s/p Kidney transplant; c. 2014 Bilat nephrectomy (transplanted kidney resected) in the setting of renal cell carcinoma; d. PD until 11/2017-->HD.  Marland Kitchen Heart murmur   . HFrEF (heart failure with reduced ejection fraction) ([place])    a. 07/2016 Echo: EF 55-60%; b. 02/2018 MV: EF 30%; c. 04/2018 Echo: EF 35%; d. 08/2018 Echo: EF 30-35%. Glob HK w/o rwma. Mildly reduced RV fxn. Mod to sev dil LA. Nl AoV fxn.  Marland Kitchen Hx of colonoscopy  2009 by Dr. Laural Golden. Normal except for small submucosal lipoma. No evidence of polyps or colitis.  . Hyperlipidemia   . Hypertension   . Hypogonadism in male 07/25/2015  . Ischemic cardiomyopathy    a. 07/2016 Echo: EF 55-60%; b. 02/2018 MV: EF 30%; c. 04/2018 Echo: EF 35%; d. 08/2018 Echo: EF 30-35%.  . OSA (obstructive sleep apnea)    No CPAP  . Permanent atrial fibrillation (H[facility])    a. CHA2DS2VASc = 3-->Coumadin (also mech AVR); b. 06/2018 Amio started 2/2 elevated rates.  . Renal cell  cancer ([place])   . Renal cell carcinoma ([place]) 08/04/2016    Past Surgical History:  Procedure Laterality Date  . AORTIC VALVE REPLACEMENT  3 /11 /2014   At [place]    . CAPD INSERTION N/A 02/19/2017   Procedure: LAPAROSCOPIC INSERTION CONTINUOUS AMBULATORY PERITONEAL DIALYSIS  (CAPD) CATHETER;  Surgeon: Algernon Huxley, MD;  Location: [facility] ORS;  Service: General;  Laterality: N/A;  . CORONARY ANGIOPLASTY WITH STENT PLACEMENT    . CORONARY ARTERY BYPASS GRAFT N/A 09/16/2018   Procedure: CORONARY ARTERY BYPASS GRAFT times three      with left internal mammary artery and endoharvest of right leg greater saphenous vein;  Surgeon: Melrose Nakayama, MD;  Location: [place];  Service: Open Heart Surgery;  Laterality: N/A;  . CORONARY STENT INTERVENTION N/A 07/15/2019   Procedure: CORONARY STENT INTERVENTION;  Surgeon: Wellington Hampshire, MD;  Location: [place] CV LAB;  Service: Cardiovascular;  Laterality: N/A;  . DG AV DIALYSIS  SHUNT ACCESS EXIST*L* OR     left arm  . EPIGASTRIC HERNIA REPAIR N/A 02/19/2017   Procedure: HERNIA REPAIR EPIGASTRIC ADULT;  Surgeon: Robert Bellow, MD;  Location: [facility] OR[facility];  Service: General;  Laterality: N/A;  . EYE SURGERY     bilateral cataract  . HERNIA REPAIR     UMBILICAL HERNIA  . HERNIA REPAIR  02/19/2017  . INNER EAR SURGERY     20 years ago  . KIDNEY TRANSPLANT    . LEFT HEART CATH AND CORS/GRAFTS ANGIOGRAPHY N/A 07/15/2019   Procedure: LEFT HEART CATH AND CORS/GRAFTS ANGIOGRAPHY;  Surgeon: Wellington Hampshire, MD;  Location: [place] [facility];  Service: Cardiovascular;  Laterality: N/A;  . Peritoneal Dialysis access placement  02/19/2017  . REMOVAL OF A DIALYSIS CATHETER N/A 11/23/2017   Procedure: REMOVAL OF A PERITONEAL DIALYSIS CATHETER;  Surgeon: Algernon Huxley, MD;  Location: [facility];  Service: Vascular;  Laterality: N/A;  . RIGHT/LEFT HEART CATH AND CORONARY ANGIOGRAPHY Bilateral 06/07/2018   Procedure:  RIGHT/LEFT HEART CATH AND CORONARY ANGIOGRAPHY;  Surgeon: Wellington Hampshire, MD;  Location: [place] [facility];  Service: Cardiovascular;  Laterality: Bilateral;  . TEE WITHOUT CARDIOVERSION N/A 07/21/2016   Procedure: TRANSESOPHAGEAL ECHOCARDIOGRAM (TEE);  Surgeon: Wellington Hampshire, MD;  Location: [facility];  Service: Cardiovascular;  Laterality: N/A;  . TEE WITHOUT CARDIOVERSION N/A 09/16/2018   Procedure: TRANSESOPHAGEAL ECHOCARDIOGRAM (TEE);  Surgeon: Melrose Nakayama, MD;  Location: [place];  Service: Open Heart Surgery;  Laterality: N/A;    Family History  Problem Relation Age of Onset  . Cancer Mother        pancreatic  . Heart disease Father   . Diabetes Father     SOCIAL HX: Non-smoker   Current Outpatient Medications:  .  acetaminophen (TYLENOL) 500 MG tablet, Take 1,000 mg 2 (two) times daily as needed by mouth for moderate pain.,  Disp: , Rfl:  .  allopurinol (ZYLOPRIM) 300 MG tablet, Take 300 mg by mouth daily., Disp: , Rfl: 5 .  cinacalcet (SENSIPAR) 30 MG tablet, Take 30 mg by mouth every other day. , Disp: , Rfl:  .  clopidogrel (PLAVIX) 75 MG tablet, Take 1 tablet (75 mg total) by mouth daily with breakfast., Disp: 90 tablet, Rfl: 3 .  lidocaine-prilocaine (EMLA) cream, Apply 1 application topically as needed (for fistula)., Disp: , Rfl:  .  loratadine (CLARITIN) 10 MG tablet, Take 10 mg by mouth daily., Disp: , Rfl:  .  metoprolol tartrate (LOPRESSOR) 25 MG tablet, Take 1 tablet (25 mg total) by mouth in the morning, at noon, and at bedtime., Disp: 270 tablet, Rfl: 3 .  multivitamin (RENA-VIT) TABS tablet, Take 1 tablet by mouth daily., Disp: , Rfl:  .  nitroGLYCERIN (NITROSTAT) 0.4 MG SL tablet, Place 1 tablet (0.4 mg total) under the tongue every 5 (five) minutes as needed for chest pain., Disp: 25 tablet, Rfl: 3 .  pantoprazole (PROTONIX) 40 MG tablet, Take 1 tablet (40 mg total) by mouth daily., Disp: 90 tablet, Rfl: 3 .  predniSONE (DELTASONE) 5 MG tablet, Take 5  mg by mouth daily with breakfast. , Disp: , Rfl:  .  rosuvastatin (CRESTOR) 40 MG tablet, Take 1 tablet (40 mg total) by mouth daily at 6 PM., Disp: 90 tablet, Rfl: 3 .  sevelamer carbonate (RENVELA) 800 MG tablet, Take 4,000 mg by mouth 3 (three) times daily with meals. 5 tabs per meal , Disp: , Rfl:  .  warfarin (COUMADIN) 1 MG tablet, Take 1-1.5 tablets (1-1.5 mg total) by mouth See admin instructions. Take 1mg  on Sun, Tues, Thur and Sat Take 1.5mg  on Mon, Wed, Fri, Disp: 180 tablet, Rfl: 1 .  ezetimibe (ZETIA) 10 MG tablet, Take 1 tablet (10 mg total) by mouth daily., Disp: 90 tablet, Rfl: 3 .  FLUoxetine (PROZAC) 20 MG tablet, Take 1 tablet (20 mg total) by mouth daily., Disp: 30 tablet, Rfl: 3  EXAM:  VITALS per patient if applicable:  GENERAL: alert, oriented, appears well and in no acute distress  HEENT: atraumatic, conjunttiva clear, no obvious abnormalities on inspection of external nose and ears  NECK: normal movements of the head and neck  LUNGS: on inspection no signs of respiratory distress, breathing rate appears normal, no obvious gross SOB, gasping or wheezing  CV: no obvious cyanosis  MS: moves all visible extremities without noticeable abnormality  PSYCH/NEURO: pleasant and cooperative, no obvious depression or anxiety, speech and thought processing grossly intact  ASSESSMENT AND PLAN:  Discussed the following assessment and plan:  Essential hypertension Well-controlled.  Continue current regimen.  Depression Recurrent issue.  We will restart Prozac.  He will follow up in 6 weeks.  ESRD (end stage renal disease) ([place]) He will continue dialysis with nephrology.   No orders of the defined types were placed in this encounter.   Meds ordered this encounter  Medications  . FLUoxetine (PROZAC) 20 MG tablet    Sig: Take 1 tablet (20 mg total) by mouth daily.    Dispense:  30 tablet    Refill:  3     I discussed the assessment and treatment plan with the  patient. The patient was provided an opportunity to ask questions and all were answered. The patient agreed with the plan and demonstrated an understanding of the instructions.   The patient was advised to call back or seek an in-person evaluation if the  symptoms worsen or if the condition fails to improve as anticipated.    Tommi Rumps, MD

## 2019-08-23 NOTE — Progress Notes (Signed)
Daily Session Note  Patient Details  Name: Jeremy [name] MRN: [id number] Date of Birth: 05/13/1955 Referring Provider:     Cardiac Rehab from 08/02/2019 in [facility]  Referring Provider  Kathlyn Sacramento MD      Encounter Date: 08/23/2019  Check In: Session Check In - 08/23/19 1126      Check-In   Supervising physician immediately available to respond to emergencies  See telemetry face sheet for immediately available ER MD    Location  [facility]-Cardiac & Pulmonary Rehab    Staff Present  Heath Lark, RN, BSN, CCRP;Amanda Sommer, BA, ACSM CEP, Exercise Physiologist;Joseph Hood RCP,RRT,BSRT    Virtual Visit  No    Medication changes reported      No    Fall or balance concerns reported     No    Warm-up and Cool-down  Performed on first and last piece of equipment    Resistance Training Performed  Yes    VAD Patient?  No    PAD/SET Patient?  No      Pain Assessment   Currently in Pain?  No/denies          Social History   Tobacco Use  Smoking Status Never Smoker  Smokeless Tobacco Never Used    Goals Met:  Independence with exercise equipment Exercise tolerated well No report of cardiac concerns or symptoms  Goals Unmet:  Not Applicable  Comments: Pt able to follow exercise prescription today without complaint.  Will continue to monitor for progression.    Dr. Emily Filbert is Medical Director for [place] and [facility].

## 2019-08-23 NOTE — Assessment & Plan Note (Signed)
Well-controlled.  Continue current regimen. 

## 2019-08-23 NOTE — Assessment & Plan Note (Signed)
Recurrent issue.  We will restart Prozac.  He will follow up in 6 weeks.

## 2019-08-23 NOTE — Assessment & Plan Note (Signed)
He will continue dialysis with nephrology.

## 2019-08-24 ENCOUNTER — Encounter: Payer: Self-pay | Admitting: *Deleted

## 2019-08-24 DIAGNOSIS — Z955 Presence of coronary angioplasty implant and graft: Secondary | ICD-10-CM

## 2019-08-24 NOTE — Patient Instructions (Signed)
Description   Pt advised by surgeon Dr Lucky Cowboy to skip warfarin 5/18 prior to procedure on 5/19 where he will receive heparin infusion for procedure. Should be able to resume warfarin 5/19 in the evening. Pt instructed to call Coumadin clinic if this is not the case. Otherwise, he will recheck INR next Monday.

## 2019-08-24 NOTE — Progress Notes (Signed)
Cardiac Individual Treatment Plan  Patient Details  Name: Jeremy [name] MRN: [id number] Date of Birth: [birth year] Referring Provider:     Cardiac Rehab from 08/02/2019 in [facility] Cardiac and Pulmonary Rehab  Referring Provider  Kathlyn Sacramento MD      Initial Encounter Date:    Cardiac Rehab from 08/02/2019 in [facility] Cardiac and Pulmonary Rehab  Date  08/02/19      Visit Diagnosis: Status post coronary artery stent placement  Patient's Home Medications on Admission:  Current Outpatient Medications:  .  acetaminophen (TYLENOL) 500 MG tablet, Take 1,000 mg 2 (two) times daily as needed by mouth for moderate pain., Disp: , Rfl:  .  allopurinol (ZYLOPRIM) 300 MG tablet, Take 300 mg by mouth daily., Disp: , Rfl: 5 .  cinacalcet (SENSIPAR) 30 MG tablet, Take 30 mg by mouth every other day. , Disp: , Rfl:  .  clopidogrel (PLAVIX) 75 MG tablet, Take 1 tablet (75 mg total) by mouth daily with breakfast., Disp: 90 tablet, Rfl: 3 .  ezetimibe (ZETIA) 10 MG tablet, Take 1 tablet (10 mg total) by mouth daily., Disp: 90 tablet, Rfl: 3 .  FLUoxetine (PROZAC) 20 MG tablet, Take 1 tablet (20 mg total) by mouth daily., Disp: 30 tablet, Rfl: 3 .  lidocaine-prilocaine (EMLA) cream, Apply 1 application topically as needed (for fistula)., Disp: , Rfl:  .  loratadine (CLARITIN) 10 MG tablet, Take 10 mg by mouth daily., Disp: , Rfl:  .  metoprolol tartrate (LOPRESSOR) 25 MG tablet, Take 1 tablet (25 mg total) by mouth in the morning, at noon, and at bedtime., Disp: 270 tablet, Rfl: 3 .  multivitamin (RENA-VIT) TABS tablet, Take 1 tablet by mouth daily., Disp: , Rfl:  .  nitroGLYCERIN (NITROSTAT) 0.4 MG SL tablet, Place 1 tablet (0.4 mg total) under the tongue every 5 (five) minutes as needed for chest pain., Disp: 25 tablet, Rfl: 3 .  pantoprazole (PROTONIX) 40 MG tablet, Take 1 tablet (40 mg total) by mouth daily., Disp: 90 tablet, Rfl: 3 .  predniSONE (DELTASONE) 5 MG tablet, Take 5 mg by mouth daily with  breakfast. , Disp: , Rfl:  .  rosuvastatin (CRESTOR) 40 MG tablet, Take 1 tablet (40 mg total) by mouth daily at 6 PM., Disp: 90 tablet, Rfl: 3 .  sevelamer carbonate (RENVELA) 800 MG tablet, Take 4,000 mg by mouth 3 (three) times daily with meals. 5 tabs per meal , Disp: , Rfl:  .  warfarin (COUMADIN) 1 MG tablet, Take 1-1.5 tablets (1-1.5 mg total) by mouth See admin instructions. Take 38m on Sun, Tues, Thur and Sat Take 1.539mon Mon, Wed, Fri, Disp: 180 tablet, Rfl: 1  Past Medical History: Past Medical History:  Diagnosis Date  . Abnormal stress test 03/19/2018  . 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 Aortic stenosis    a. 2014 s/p mech AVR ([facility])-->chronic coumadin; b. 08/2018 Echo: Mild AS/AI. Nl fxn/gradient.  . Arthritis   . [place]  . CAD (coronary artery disease)    a. 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 [facility] inflat infarct w/ min rev, EF 30%; c. 06/2018 Cath: LM min irregs, LAD 60p, 95/30/30m, LCX min irregs, RCA 40p, 49m43md, RPAV 90, RPDA 95ost; c. 09/2018 CABGx3 (LIMA->LAD, VG->D1, VG->PDA).  . Chronic anticoagulation 06/2012   a. Coumadin in the setting of mech AVR and afib.  .Marland Kitchen  COVID-19 03/15/2019  . CPAP (continuous positive airway pressure) dependence   . Dialysis patient [facility]) 04/07/2006   Patient started HD around 1996,  had 1st transplant LLQ around 1998 until 2007 when they found renal cell cancer in transplant and both native kidneys, all were removed > went back on HD until 2nd transplant RLQ in 2014 which lasted 3 yrs; it was embolized for suspected acute rejection. Is currently on HD MWF in [place] w/ [facility] nephrology.  . ESRD (end stage renal disease) ([place])    a. 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 1999 s/p Kidney transplant; c. 2014 Bilat nephrectomy (transplanted kidney resected) in the setting of renal cell carcinoma; d. PD until  11/2017-->HD.  Marland Kitchen Heart murmur   . HFrEF (heart failure with reduced ejection fraction) ([place])    a. 07/2016 Echo: EF 55-60%; b. 02/2018 MV: EF 30%; c. 04/2018 Echo: EF 35%; d. 08/2018 Echo: EF 30-35%. Glob HK w/o rwma. Mildly reduced RV fxn. Mod to sev dil LA. Nl AoV fxn.  Marland Kitchen Hx of colonoscopy    2009 by Dr. Laural Golden. Normal except for small submucosal lipoma. No evidence of polyps or colitis.  . Hyperlipidemia   . Hypertension   . Hypogonadism in male 07/25/2015  . Ischemic cardiomyopathy    a. 07/2016 Echo: EF 55-60%; b. 02/2018 MV: EF 30%; c. 04/2018 Echo: EF 35%; d. 08/2018 Echo: EF 30-35%.  . OSA (obstructive sleep apnea)    No CPAP  . Permanent atrial fibrillation ([facility])    a. CHA2DS2VASc = 3-->Coumadin (also mech AVR); b. 06/2018 Amio started 2/2 elevated rates.  . Renal cell cancer ([place])   . Renal cell carcinoma ([place]) 08/04/2016    Tobacco Use: Social History   Tobacco Use  Smoking Status Never Smoker  Smokeless Tobacco Never Used    Labs: Recent Review Flowsheet Data    Labs for ITP Cardiac and Pulmonary Rehab Latest Ref Rng & Units 09/16/2018 09/16/2018 03/15/2019 04/26/2019 06/21/2019   Cholestrol 0 - 200 mg/dL - - - 179 136   LDLCALC 0 - 99 mg/dL - - - 111(H) 73   LDLDIRECT mg/dL - - - - -   HDL >40 mg/dL - - - 38(L) 33(L)   Trlycerides <150 mg/dL - - 179(H) 167(H) 150(H)   Hemoglobin A1c 4.8 - 5.6 % - - - - -   PHART 7.350 - 7.450 7.329(L) - - - -   PCO2ART 32.0 - 48.0 mmHg 48.1(H) - - - -   HCO3 20.0 - 28.0 mmol/L 25.2 - - - -   TCO2 22 - 32 mmol/L 27 24 - - -   ACIDBASEDEF 0.0 - 2.0 mmol/L 1.0 - - - -   O2SAT % 99.0 - - - -       Exercise Target Goals: Exercise Program Goal: Individual exercise prescription set using results from initial 6 min walk test and THRR while considering  patient's activity barriers and safety.   Exercise Prescription Goal: Initial exercise prescription builds to 30-45 minutes a day of aerobic activity, 2-3 days per week.  Home exercise  guidelines will be given to patient during program as part of exercise prescription that the participant will acknowledge.   Education: Aerobic Exercise & Resistance Training: - Gives group verbal and written instruction on the various components of exercise. Focuses on aerobic and resistive training programs and the benefits of this training and how to safely progress through these programs..   Education: Exercise & Equipment  Safety: - Individual verbal instruction and demonstration of equipment use and safety with use of the equipment.   Cardiac Rehab from 07/28/2019 in [facility] Cardiac and Pulmonary Rehab  Date  07/28/19  Educator  [facility]  Instruction Review Code  1- Verbalizes Understanding      Education: Exercise Physiology & General Exercise Guidelines: - Group verbal and written instruction with models to review the exercise physiology of the cardiovascular system and associated critical values. Provides general exercise guidelines with specific guidelines to those with heart or lung disease.    Education: Flexibility, Balance, Mind/Body Relaxation: Provides group verbal/written instruction on the benefits of flexibility and balance training, including mind/body exercise modes such as yoga, pilates and tai chi.  Demonstration and skill practice provided.   Cardiac Rehab from 02/24/2019 in [facility] Cardiac and Pulmonary Rehab  Date  02/24/19  Educator  as  Instruction Review Code  1- Verbalizes Understanding      Activity Barriers & Risk Stratification: Activity Barriers & Cardiac Risk Stratification - 08/02/19 0956      Activity Barriers & Cardiac Risk Stratification   Activity Barriers  Joint Problems;Muscular Weakness;Deconditioning;Shortness of Breath;Balance Concerns    Cardiac Risk Stratification  High       6 Minute Walk: 6 Minute Walk    Row Name 08/02/19 0943         6 Minute Walk   Phase  Initial     Distance  1265 feet     Walk Time  6 minutes     # of Rest Breaks   0     MPH  2.4     METS  3.68     RPE  17     VO2 Peak  12.85     Symptoms  Yes (comment)     Comments  joints achy     Resting HR  88 bpm     Resting BP  128/64     Resting Oxygen Saturation   99 %     Exercise Oxygen Saturation  during 6 min walk  100 %     Max Ex. HR  119 bpm     Max Ex. BP  148/60     2 Minute Post BP  122/64        Oxygen Initial Assessment:   Oxygen Re-Evaluation:   Oxygen Discharge (Final Oxygen Re-Evaluation):   Initial Exercise Prescription: Initial Exercise Prescription - 08/02/19 0900      Date of Initial Exercise RX and Referring Provider   Date  08/02/19    Referring Provider  Kathlyn Sacramento MD      Treadmill   MPH  2.3    Grade  1    Minutes  15    METs  3.08      NuStep   Level  3    SPM  80    Minutes  15    METs  3      REL-XR   Level  3    Speed  50    Minutes  15    METs  3      T5 Nustep   Level  3    SPM  80    Minutes  15    METs  3      Biostep-RELP   Level  3    SPM  50    Minutes  15    METs  3      Prescription Details  Frequency (times per week)  2    Duration  Progress to 30 minutes of continuous aerobic without signs/symptoms of physical distress      Intensity   THRR 40-80% of Max Heartrate  116-144    Ratings of Perceived Exertion  11-13    Perceived Dyspnea  0-4      Progression   Progression  Continue to progress workloads to maintain intensity without signs/symptoms of physical distress.      Resistance Training   Training Prescription  Yes    Weight  3 lb    Reps  10-15       Perform Capillary Blood Glucose checks as needed.  Exercise Prescription Changes: Exercise Prescription Changes    Row Name 08/02/19 0900 08/22/19 1300           Response to Exercise   Blood Pressure (Admit)  128/64  118/60      Blood Pressure (Exercise)  148/60  130/62      Blood Pressure (Exit)  122/64  124/64      Heart Rate (Admit)  88 bpm  122 bpm      Heart Rate (Exercise)  119 bpm  123 bpm       Heart Rate (Exit)  95 bpm  122 bpm      Oxygen Saturation (Admit)  99 %  -      Oxygen Saturation (Exercise)  100 %  -      Rating of Perceived Exertion (Exercise)  17  14      Symptoms  joints achy  none      Comments  walk test results  -      Duration  -  Continue with 30 min of aerobic exercise without signs/symptoms of physical distress.      Intensity  -  THRR unchanged        Progression   Progression  -  Continue to progress workloads to maintain intensity without signs/symptoms of physical distress.      Average METs  -  3.59        Resistance Training   Training Prescription  -  Yes      Weight  -  3 lb      Reps  -  10-15        Interval Training   Interval Training  -  No        Treadmill   MPH  -  2.3      Grade  -  1      Minutes  -  15      METs  -  3.08        REL-XR   Level  -  3      Minutes  -  15      METs  -  4.1         Exercise Comments:   Exercise Goals and Review: Exercise Goals    Row Name 08/02/19 1005             Exercise Goals   Increase Physical Activity  Yes       Intervention  Provide advice, education, support and counseling about physical activity/exercise needs.;Develop an individualized exercise prescription for aerobic and resistive training based on initial evaluation findings, risk stratification, comorbidities and participant's personal goals.       Expected Outcomes  Short Term: Attend rehab on a regular basis to increase amount of physical activity.;Long Term: Add in home exercise  to make exercise part of routine and to increase amount of physical activity.;Long Term: Exercising regularly at least 3-5 days a week.       Increase Strength and Stamina  Yes       Intervention  Provide advice, education, support and counseling about physical activity/exercise needs.;Develop an individualized exercise prescription for aerobic and resistive training based on initial evaluation findings, risk stratification, comorbidities and  participant's personal goals.       Expected Outcomes  Short Term: Increase workloads from initial exercise prescription for resistance, speed, and METs.;Short Term: Perform resistance training exercises routinely during rehab and add in resistance training at home;Long Term: Improve cardiorespiratory fitness, muscular endurance and strength as measured by increased METs and functional capacity (6MWT)       Able to understand and use rate of perceived exertion (RPE) scale  Yes       Intervention  Provide education and explanation on how to use RPE scale       Expected Outcomes  Short Term: Able to use RPE daily in rehab to express subjective intensity level;Long Term:  Able to use RPE to guide intensity level when exercising independently       Able to understand and use Dyspnea scale  Yes       Intervention  Provide education and explanation on how to use Dyspnea scale       Expected Outcomes  Short Term: Able to use Dyspnea scale daily in rehab to express subjective sense of shortness of breath during exertion;Long Term: Able to use Dyspnea scale to guide intensity level when exercising independently       Knowledge and understanding of Target Heart Rate Range (THRR)  Yes       Intervention  Provide education and explanation of THRR including how the numbers were predicted and where they are located for reference       Expected Outcomes  Short Term: Able to state/look up THRR;Short Term: Able to use daily as guideline for intensity in rehab;Long Term: Able to use THRR to govern intensity when exercising independently       Able to check pulse independently  Yes       Intervention  Provide education and demonstration on how to check pulse in carotid and radial arteries.;Review the importance of being able to check your own pulse for safety during independent exercise       Expected Outcomes  Short Term: Able to explain why pulse checking is important during independent exercise;Long Term: Able to check  pulse independently and accurately       Understanding of Exercise Prescription  Yes       Intervention  Provide education, explanation, and written materials on patient's individual exercise prescription       Expected Outcomes  Short Term: Able to explain program exercise prescription;Long Term: Able to explain home exercise prescription to exercise independently          Exercise Goals Re-Evaluation : Exercise Goals Re-Evaluation    Row Name 08/11/19 1056 08/22/19 1331           Exercise Goal Re-Evaluation   Exercise Goals Review  Increase Physical Activity;Able to understand and use rate of perceived exertion (RPE) scale;Knowledge and understanding of Target Heart Rate Range (THRR);Understanding of Exercise Prescription;Increase Strength and Stamina;Able to check pulse independently  Increase Physical Activity;Increase Strength and Stamina;Understanding of Exercise Prescription      Comments  Reviewed RPE and dyspnea scales, THR and program prescription with pt  today.  Pt voiced understanding and was given a copy of goals to take home.  Anthonny is off to a good start again in rehab.  He is already up to 4.1 METs on the XR.  We will continue to monitor his progress.      Expected Outcomes  Short: Use RPE daily to regulate intensity. Long: Follow program prescription in THR.  Short: Begin to increase workloads  Long: Continue to follow program prescription.         Discharge Exercise Prescription (Final Exercise Prescription Changes): Exercise Prescription Changes - 08/22/19 1300      Response to Exercise   Blood Pressure (Admit)  118/60    Blood Pressure (Exercise)  130/62    Blood Pressure (Exit)  124/64    Heart Rate (Admit)  122 bpm    Heart Rate (Exercise)  123 bpm    Heart Rate (Exit)  122 bpm    Rating of Perceived Exertion (Exercise)  14    Symptoms  none    Duration  Continue with 30 min of aerobic exercise without signs/symptoms of physical distress.    Intensity  THRR  unchanged      Progression   Progression  Continue to progress workloads to maintain intensity without signs/symptoms of physical distress.    Average METs  3.59      Resistance Training   Training Prescription  Yes    Weight  3 lb    Reps  10-15      Interval Training   Interval Training  No      Treadmill   MPH  2.3    Grade  1    Minutes  15    METs  3.08      REL-XR   Level  3    Minutes  15    METs  4.1       Nutrition:  Target Goals: Understanding of nutrition guidelines, daily intake of sodium <1568m, cholesterol <2065m calories 30% from fat and 7% or less from saturated fats, daily to have 5 or more servings of fruits and vegetables.  Education: Controlling Sodium/Reading Food Labels -Group verbal and written material supporting the discussion of sodium use in heart healthy nutrition. Review and explanation with models, verbal and written materials for utilization of the food label.   Education: General Nutrition Guidelines/Fats and Fiber: -Group instruction provided by verbal, written material, models and posters to present the general guidelines for heart healthy nutrition. Gives an explanation and review of dietary fats and fiber.   [facility]  Date  02/24/19  Educator  [facility]  Instruction Review Code  1- Verbalizes Understanding      Biometrics: Pre Biometrics - 08/02/19 1005      Pre Biometrics   Height  5' 9.9" (1.775 m)    Weight  189 lb (85.7 kg)    BMI (Calculated)  27.21    Single Leg Stand  1.21 seconds        Nutrition Therapy Plan and Nutrition Goals:   Nutrition Assessments: Nutrition Assessments - 08/02/19 1006      MEDFICTS Scores   Pre Score  56       MEDIFICTS Score Key:          ?70 Need to make dietary changes          40-70 Heart Healthy Diet         ? 40 Therapeutic Level Cholesterol Diet  Nutrition Goals Re-Evaluation:   Nutrition Goals Discharge (Final  Nutrition Goals Re-Evaluation):   Psychosocial: Target Goals: Acknowledge presence or absence of significant depression and/or stress, maximize coping skills, provide positive support system. Participant is able to verbalize types and ability to use techniques and skills needed for reducing stress and depression.   Education: Depression - Provides group verbal and written instruction on the correlation between heart/lung disease and depressed mood, treatment options, and the stigmas associated with seeking treatment.   Education: Sleep Hygiene -Provides group verbal and written instruction about how sleep can affect your health.  Define sleep hygiene, discuss sleep cycles and impact of sleep habits. Review good sleep hygiene tips.     Education: Stress and Anxiety: - Provides group verbal and written instruction about the health risks of elevated stress and causes of high stress.  Discuss the correlation between heart/lung disease and anxiety and treatment options. Review healthy ways to manage with stress and anxiety.    Initial Review & Psychosocial Screening: Initial Psych Review & Screening - 07/28/19 0940      Initial Review   Current issues with  None Identified      Family Dynamics   Good Support System?  Yes    Comments  He can look to his girlfriend and daughter for support.      Barriers   Psychosocial barriers to participate in program  The patient should benefit from training in stress management and relaxation.      Screening Interventions   Interventions  Encouraged to exercise;To provide support and resources with identified psychosocial needs;Provide feedback about the scores to participant    Expected Outcomes  Short Term goal: Utilizing psychosocial counselor, staff and physician to assist with identification of specific Stressors or current issues interfering with healing process. Setting desired goal for each stressor or current issue identified.;Long Term Goal:  Stressors or current issues are controlled or eliminated.;Short Term goal: Identification and review with participant of any Quality of Life or Depression concerns found by scoring the questionnaire.;Long Term goal: The participant improves quality of Life and PHQ9 Scores as seen by post scores and/or verbalization of changes       Quality of Life Scores:  Quality of Life - 08/02/19 1006      Quality of Life   Select  Quality of Life      Quality of Life Scores   Health/Function Pre  11.07 %    Socioeconomic Pre  21.56 %    Psych/Spiritual Pre  13.29 %    Family Pre  24 %    GLOBAL Pre  15.76 %      Scores of 19 and below usually indicate a poorer quality of life in these areas.  A difference of  2-3 points is a clinically meaningful difference.  A difference of 2-3 points in the total score of the Quality of Life Index has been associated with significant improvement in overall quality of life, self-image, physical symptoms, and general health in studies assessing change in quality of life.  PHQ-9: Recent Review Flowsheet Data    Depression screen [facility] 2/9 08/19/2019 08/02/2019 05/25/2019 03/23/2019 11/09/2018   Decreased Interest 0 3 0 0 0   Down, Depressed, Hopeless 2 1 0 0 0   PHQ - 2 Score 2 4 0 0 0   Altered sleeping 0 1 - - 0   Tired, decreased energy 0 2 - - 0   Change in appetite 0 1 - - 0  Feeling bad or failure about yourself  2 2 - - 0   Trouble concentrating 0 0 - - 0   Moving slowly or fidgety/restless 0 0 - - 0   Suicidal thoughts 0 0 - - 0   PHQ-9 Score 4 10 - - 0   Difficult doing work/chores Not difficult at all Not difficult at all - - Not difficult at all     Interpretation of Total Score  Total Score Depression Severity:  1-4 = Minimal depression, 5-9 = Mild depression, 10-14 = Moderate depression, 15-19 = Moderately severe depression, 20-27 = Severe depression   Psychosocial Evaluation and Intervention: Psychosocial Evaluation - 07/28/19 0941       Psychosocial Evaluation & Interventions   Interventions  Encouraged to exercise with the program and follow exercise prescription;Stress management education    Comments  Dylin has a positive outlook on his health and is ready to start rehab.    Expected Outcomes  Short: Attend HeartTrack stress management education to decrease stress. Long: Maintain exercise Post HeartTrack to keep stress at a minimum.    Continue Psychosocial Services   Follow up required by staff       Psychosocial Re-Evaluation:   Psychosocial Discharge (Final Psychosocial Re-Evaluation):   Vocational Rehabilitation: Provide vocational rehab assistance to qualifying candidates.   Vocational Rehab Evaluation & Intervention:   Education: Education Goals: Education classes will be provided on a variety of topics geared toward better understanding of heart health and risk factor modification. Participant will state understanding/return demonstration of topics presented as noted by education test scores.  Learning Barriers/Preferences: Learning Barriers/Preferences - 07/28/19 0941      Learning Barriers/Preferences   Learning Barriers  None    Learning Preferences  None       General Cardiac Education Topics:  AED/CPR: - Group verbal and written instruction with the use of models to demonstrate the basic use of the AED with the basic ABC's of resuscitation.   Anatomy & Physiology of the Heart: - Group verbal and written instruction and models provide basic cardiac anatomy and physiology, with the coronary electrical and arterial systems. Review of Valvular disease and Heart Failure   Cardiac Procedures: - Group verbal and written instruction to review commonly prescribed medications for heart disease. Reviews the medication, class of the drug, and side effects. Includes the steps to properly store meds and maintain the prescription regimen. (beta blockers and nitrates)   Cardiac Medications I: - Group  verbal and written instruction to review commonly prescribed medications for heart disease. Reviews the medication, class of the drug, and side effects. Includes the steps to properly store meds and maintain the prescription regimen.   Cardiac Medications II: -Group verbal and written instruction to review commonly prescribed medications for heart disease. Reviews the medication, class of the drug, and side effects. (all other drug classes)    Go Sex-Intimacy & Heart Disease, Get SMART - Goal Setting: - Group verbal and written instruction through game format to discuss heart disease and the return to sexual intimacy. Provides group verbal and written material to discuss and apply goal setting through the application of the S.M.A.R.T. Method.   Other Matters of the Heart: - Provides group verbal, written materials and models to describe Stable Angina and Peripheral Artery. Includes description of the disease process and treatment options available to the cardiac patient.   Infection Prevention: - Provides verbal and written material to individual with discussion of infection control including proper hand washing and  proper equipment cleaning during exercise session.   Cardiac Rehab from 07/28/2019 in [facility] Cardiac and Pulmonary Rehab  Date  07/28/19  Educator  [facility]  Instruction Review Code  1- Verbalizes Understanding      Falls Prevention: - Provides verbal and written material to individual with discussion of falls prevention and safety.   Cardiac Rehab from 07/28/2019 in [facility] Cardiac and Pulmonary Rehab  Date  07/28/19  Educator  [facility]  Instruction Review Code  1- Verbalizes Understanding      Other: -Provides group and verbal instruction on various topics (see comments)   Knowledge Questionnaire Score: Knowledge Questionnaire Score - 08/02/19 1006      Knowledge Questionnaire Score   Pre Score  23/26 Education Focus: NTG, angina, Exercise       Core Components/Risk  Factors/Patient Goals at Admission: Personal Goals and Risk Factors at Admission - 08/02/19 1007      Core Components/Risk Factors/Patient Goals on Admission    Weight Management  Yes;Weight Loss    Intervention  Weight Management: Develop a combined nutrition and exercise program designed to reach desired caloric intake, while maintaining appropriate intake of nutrient and fiber, sodium and fats, and appropriate energy expenditure required for the weight goal.;Weight Management: Provide education and appropriate resources to help participant work on and attain dietary goals.    Admit Weight  189 lb (85.7 kg)    Goal Weight: Short Term  184 lb (83.5 kg)    Goal Weight: Long Term  179 lb (81.2 kg)    Expected Outcomes  Short Term: Continue to assess and modify interventions until short term weight is achieved;Long Term: Adherence to nutrition and physical activity/exercise program aimed toward attainment of established weight goal;Weight Loss: Understanding of general recommendations for a balanced deficit meal plan, which promotes 1-2 lb weight loss per week and includes a negative energy balance of [phone] kcal/d;Understanding recommendations for meals to include 15-35% energy as protein, 25-35% energy from fat, 35-60% energy from carbohydrates, less than 269m of dietary cholesterol, 20-35 gm of total fiber daily;Understanding of distribution of calorie intake throughout the day with the consumption of 4-5 meals/snacks    Heart Failure  Yes    Intervention  Provide a combined exercise and nutrition program that is supplemented with education, support and counseling about heart failure. Directed toward relieving symptoms such as shortness of breath, decreased exercise tolerance, and extremity edema.    Expected Outcomes  Improve functional capacity of life;Short term: Attendance in program 2-3 days a week with increased exercise capacity. Reported lower sodium intake. Reported increased fruit and  vegetable intake. Reports medication compliance.;Short term: Daily weights obtained and reported for increase. Utilizing diuretic protocols set by physician.;Long term: Adoption of self-care skills and reduction of barriers for early signs and symptoms recognition and intervention leading to self-care maintenance.    Hypertension  Yes    Intervention  Provide education on lifestyle modifcations including regular physical activity/exercise, weight management, moderate sodium restriction and increased consumption of fresh fruit, vegetables, and low fat dairy, alcohol moderation, and smoking cessation.;Monitor prescription use compliance.    Expected Outcomes  Short Term: Continued assessment and intervention until BP is < 140/966mHG in hypertensive participants. < 130/8061mG in hypertensive participants with diabetes, heart failure or chronic kidney disease.;Long Term: Maintenance of blood pressure at goal levels.    Lipids  Yes    Intervention  Provide education and support for participant on nutrition & aerobic/resistive exercise along with prescribed medications to achieve LDL <  21m, HDL >459m    Expected Outcomes  Short Term: Participant states understanding of desired cholesterol values and is compliant with medications prescribed. Participant is following exercise prescription and nutrition guidelines.;Long Term: Cholesterol controlled with medications as prescribed, with individualized exercise RX and with personalized nutrition plan. Value goals: LDL < 7022mHDL > 40 mg.       Education:Diabetes - Individual verbal and written instruction to review signs/symptoms of diabetes, desired ranges of glucose level fasting, after meals and with exercise. Acknowledge that pre and post exercise glucose checks will be done for 3 sessions at entry of program.   Education: Know Your Numbers and Risk Factors: -Group verbal and written instruction about important numbers in your health.  Discussion of what  are risk factors and how they play a role in the disease process.  Review of Cholesterol, Blood Pressure, Diabetes, and BMI and the role they play in your overall health.   Core Components/Risk Factors/Patient Goals Review:    Core Components/Risk Factors/Patient Goals at Discharge (Final Review):    ITP Comments: ITP Comments    Row Name 07/28/19 0944 08/02/19 0941 08/11/19 1055 08/24/19 0531     ITP Comments  Virtual Orientation performed. Patient informed when to come in for RD and EP orientation. [facility]  Completed 6MWT and gym orientation.  Initial ITP created and sent for review to Dr. MarEmily Filbertedical Director.  First full day of exercise!  Patient was oriented to gym and equipment including functions, settings, policies, and procedures.  Patient's individual exercise prescription and treatment plan were reviewed.  All starting workloads were established based on the results of the 6 minute walk test done at initial orientation visit.  The plan for exercise progression was also introduced and progression will be customized based on patient's performance and goals  30 Day review completed. ITP review done, changes made as directed,and approval shown by signature of  proScientist, research (life sciences)New to program       Comments:

## 2019-08-25 ENCOUNTER — Ambulatory Visit
Admission: RE | Admit: 2019-08-25 | Discharge: 2019-08-25 | Disposition: A | Discharge status: Home / Self Care | Payer: Medicare Other | Attending: Vascular Surgery | Admitting: Vascular Surgery

## 2019-08-25 ENCOUNTER — Other Ambulatory Visit: Payer: Self-pay

## 2019-08-25 ENCOUNTER — Encounter: Payer: Self-pay | Admitting: Vascular Surgery

## 2019-08-25 ENCOUNTER — Encounter
Admission: RE | Disposition: A | Discharge status: Home / Self Care | Payer: Self-pay | Source: Home / Self Care | Attending: Vascular Surgery

## 2019-08-25 DIAGNOSIS — I70235 Atherosclerosis of native arteries of right leg with ulceration of other part of foot: Secondary | ICD-10-CM | POA: Insufficient documentation

## 2019-08-25 DIAGNOSIS — L97519 Non-pressure chronic ulcer of other part of right foot with unspecified severity: Secondary | ICD-10-CM | POA: Insufficient documentation

## 2019-08-25 DIAGNOSIS — I255 Ischemic cardiomyopathy: Secondary | ICD-10-CM | POA: Insufficient documentation

## 2019-08-25 DIAGNOSIS — Z7902 Long term (current) use of antithrombotics/antiplatelets: Secondary | ICD-10-CM | POA: Insufficient documentation

## 2019-08-25 DIAGNOSIS — Z85528 Personal history of other malignant neoplasm of kidney: Secondary | ICD-10-CM | POA: Diagnosis not present

## 2019-08-25 DIAGNOSIS — Z951 Presence of aortocoronary bypass graft: Secondary | ICD-10-CM | POA: Diagnosis not present

## 2019-08-25 DIAGNOSIS — N186 End stage renal disease: Secondary | ICD-10-CM | POA: Diagnosis not present

## 2019-08-25 DIAGNOSIS — I130 Hypertensive heart and chronic kidney disease with heart failure and stage 1 through stage 4 chronic kidney disease, or unspecified chronic kidney disease: Secondary | ICD-10-CM | POA: Insufficient documentation

## 2019-08-25 DIAGNOSIS — Z992 Dependence on renal dialysis: Secondary | ICD-10-CM | POA: Insufficient documentation

## 2019-08-25 DIAGNOSIS — I5020 Unspecified systolic (congestive) heart failure: Secondary | ICD-10-CM | POA: Insufficient documentation

## 2019-08-25 DIAGNOSIS — Z7901 Long term (current) use of anticoagulants: Secondary | ICD-10-CM | POA: Diagnosis not present

## 2019-08-25 DIAGNOSIS — E785 Hyperlipidemia, unspecified: Secondary | ICD-10-CM | POA: Insufficient documentation

## 2019-08-25 DIAGNOSIS — Z888 Allergy status to other drugs, medicaments and biological substances status: Secondary | ICD-10-CM | POA: Insufficient documentation

## 2019-08-25 DIAGNOSIS — Z905 Acquired absence of kidney: Secondary | ICD-10-CM | POA: Diagnosis not present

## 2019-08-25 DIAGNOSIS — Z955 Presence of coronary angioplasty implant and graft: Secondary | ICD-10-CM | POA: Diagnosis not present

## 2019-08-25 DIAGNOSIS — Z952 Presence of prosthetic heart valve: Secondary | ICD-10-CM | POA: Diagnosis not present

## 2019-08-25 DIAGNOSIS — I4819 Other persistent atrial fibrillation: Secondary | ICD-10-CM | POA: Diagnosis not present

## 2019-08-25 DIAGNOSIS — I2510 Atherosclerotic heart disease of native coronary artery without angina pectoris: Secondary | ICD-10-CM | POA: Insufficient documentation

## 2019-08-25 DIAGNOSIS — M1990 Unspecified osteoarthritis, unspecified site: Secondary | ICD-10-CM | POA: Insufficient documentation

## 2019-08-25 DIAGNOSIS — I70239 Atherosclerosis of native arteries of right leg with ulceration of unspecified site: Secondary | ICD-10-CM | POA: Diagnosis not present

## 2019-08-25 DIAGNOSIS — Z940 Kidney transplant status: Secondary | ICD-10-CM | POA: Diagnosis not present

## 2019-08-25 DIAGNOSIS — L97909 Non-pressure chronic ulcer of unspecified part of unspecified lower leg with unspecified severity: Secondary | ICD-10-CM

## 2019-08-25 DIAGNOSIS — Z79899 Other long term (current) drug therapy: Secondary | ICD-10-CM | POA: Diagnosis not present

## 2019-08-25 DIAGNOSIS — Z8616 Personal history of COVID-19: Secondary | ICD-10-CM | POA: Insufficient documentation

## 2019-08-25 DIAGNOSIS — Z8249 Family history of ischemic heart disease and other diseases of the circulatory system: Secondary | ICD-10-CM | POA: Insufficient documentation

## 2019-08-25 DIAGNOSIS — G4733 Obstructive sleep apnea (adult) (pediatric): Secondary | ICD-10-CM | POA: Diagnosis not present

## 2019-08-25 HISTORY — PX: LOWER EXTREMITY ANGIOGRAPHY: CATH118251

## 2019-08-25 LAB — PROTIME-INR
INR: 1.7 — ABNORMAL HIGH (ref 0.8–1.2)
Prothrombin Time: 19.3 seconds — ABNORMAL HIGH (ref 11.4–15.2)

## 2019-08-25 SURGERY — LOWER EXTREMITY ANGIOGRAPHY
Anesthesia: Moderate Sedation | Site: Leg Lower | Laterality: Right

## 2019-08-25 MED ORDER — IODIXANOL 320 MG/ML IV SOLN
INTRAVENOUS | Status: DC | PRN
  Administered 2019-08-25: 65 mL via INTRA_ARTERIAL

## 2019-08-25 MED ORDER — SODIUM CHLORIDE 0.9 % IV SOLN: INTRAVENOUS | Status: DC

## 2019-08-25 MED ORDER — HYDROMORPHONE HCL 1 MG/ML IJ SOLN: 1.0000 mg | Freq: Once | INTRAMUSCULAR | Status: DC | PRN

## 2019-08-25 MED ORDER — FAMOTIDINE 20 MG PO TABS: 40.0000 mg | ORAL_TABLET | Freq: Once | ORAL | Status: DC | PRN

## 2019-08-25 MED ORDER — ONDANSETRON HCL 4 MG/2ML IJ SOLN: 4.0000 mg | Freq: Four times a day (QID) | INTRAMUSCULAR | Status: DC | PRN

## 2019-08-25 MED ORDER — LABETALOL HCL 5 MG/ML IV SOLN: 10.0000 mg | INTRAVENOUS | Status: DC | PRN

## 2019-08-25 MED ORDER — FENTANYL CITRATE (PF) 100 MCG/2ML IJ SOLN
INTRAMUSCULAR | Status: AC
  Filled 2019-08-25: qty 2

## 2019-08-25 MED ORDER — MIDAZOLAM HCL 5 MG/5ML IJ SOLN
INTRAMUSCULAR | Status: AC
  Filled 2019-08-25: qty 5

## 2019-08-25 MED ORDER — SODIUM CHLORIDE 0.9% FLUSH: 3.0000 mL | INTRAVENOUS | Status: DC | PRN

## 2019-08-25 MED ORDER — MIDAZOLAM HCL 2 MG/ML PO SYRP: 8.0000 mg | ORAL_SOLUTION | Freq: Once | ORAL | Status: DC | PRN

## 2019-08-25 MED ORDER — DIPHENHYDRAMINE HCL 50 MG/ML IJ SOLN: 50.0000 mg | Freq: Once | INTRAMUSCULAR | Status: DC | PRN

## 2019-08-25 MED ORDER — CEFAZOLIN SODIUM-DEXTROSE 1-4 GM/50ML-% IV SOLN
1.0000 g | Freq: Once | INTRAVENOUS | Status: AC
  Administered 2019-08-25: 1 g via INTRAVENOUS

## 2019-08-25 MED ORDER — HYDRALAZINE HCL 20 MG/ML IJ SOLN: 5.0000 mg | INTRAMUSCULAR | Status: DC | PRN

## 2019-08-25 MED ORDER — FENTANYL CITRATE (PF) 100 MCG/2ML IJ SOLN
INTRAMUSCULAR | Status: DC | PRN
  Administered 2019-08-25: 25 ug via INTRAVENOUS
  Administered 2019-08-25: 50 ug via INTRAVENOUS
  Administered 2019-08-25: 25 ug via INTRAVENOUS

## 2019-08-25 MED ORDER — HEPARIN SODIUM (PORCINE) 1000 UNIT/ML IJ SOLN
INTRAMUSCULAR | Status: AC
  Filled 2019-08-25: qty 1

## 2019-08-25 MED ORDER — SODIUM CHLORIDE 0.9 % IV SOLN: 250.0000 mL | INTRAVENOUS | Status: DC | PRN

## 2019-08-25 MED ORDER — SODIUM CHLORIDE 0.9% FLUSH: 3.0000 mL | Freq: Two times a day (BID) | INTRAVENOUS | Status: DC

## 2019-08-25 MED ORDER — ACETAMINOPHEN 325 MG PO TABS: 650.0000 mg | ORAL_TABLET | ORAL | Status: DC | PRN

## 2019-08-25 MED ORDER — METHYLPREDNISOLONE SODIUM SUCC 125 MG IJ SOLR: 125.0000 mg | Freq: Once | INTRAMUSCULAR | Status: DC | PRN

## 2019-08-25 MED ORDER — HEPARIN SODIUM (PORCINE) 1000 UNIT/ML IJ SOLN
INTRAMUSCULAR | Status: DC | PRN
  Administered 2019-08-25: 3000 [IU] via INTRAVENOUS

## 2019-08-25 MED ORDER — MIDAZOLAM HCL 2 MG/2ML IJ SOLN
INTRAMUSCULAR | Status: DC | PRN
  Administered 2019-08-25: 1 mg via INTRAVENOUS
  Administered 2019-08-25: 2 mg via INTRAVENOUS
  Administered 2019-08-25: 1 mg via INTRAVENOUS

## 2019-08-25 SURGICAL SUPPLY — 21 items
BALLN LUTONIX 018 4X150X130 (BALLOONS) ×3
BALLN LUTONIX 018 4X80X130 (BALLOONS) ×3
BALLN ULTRVRSE 3X300X150 (BALLOONS) ×2
BALLN ULTRVRSE 3X300X150 OTW (BALLOONS) ×1
BALLOON LUTONIX 018 4X150X130 (BALLOONS) ×1 IMPLANT
BALLOON LUTONIX 018 4X80X130 (BALLOONS) ×1 IMPLANT
BALLOON ULTRVRSE 3X300X150 OTW (BALLOONS) ×1 IMPLANT
CATH ANGIO 5F PIGTAIL 65CM (CATHETERS) ×3 IMPLANT
CATH VERT 5FR 125CM (CATHETERS) ×3 IMPLANT
DEVICE PRESTO INFLATION (MISCELLANEOUS) ×3 IMPLANT
DEVICE SAFEGUARD 24CM (GAUZE/BANDAGES/DRESSINGS) ×3 IMPLANT
DEVICE STARCLOSE SE CLOSURE (Vascular Products) ×3 IMPLANT
GLIDEWIRE ADV .035X180CM (WIRE) ×3 IMPLANT
GUIDEWIRE PFTE-COATED .018X300 (WIRE) ×3 IMPLANT
PACK ANGIOGRAPHY (CUSTOM PROCEDURE TRAY) ×3 IMPLANT
SHEATH ANL2 6FRX45 HC (SHEATH) ×3 IMPLANT
SHEATH BRITE TIP 5FRX11 (SHEATH) ×3 IMPLANT
SHEATH RAABE 6FRX70 (SHEATH) ×3 IMPLANT
SYR MEDRAD MARK 7 150ML (SYRINGE) ×3 IMPLANT
TUBING CONTRAST HIGH PRESS 72 (TUBING) ×3 IMPLANT
WIRE J 3MM .035X145CM (WIRE) ×3 IMPLANT

## 2019-08-25 NOTE — H&P (Signed)
[facility] History & Physical Update  The patient was interviewed and re-examined.  The patient's previous History and Physical has been reviewed and is unchanged.  There is no change in the plan of care. We plan to proceed with the scheduled procedure.  Leotis Pain, MD  08/25/2019, 9:28 AM

## 2019-08-25 NOTE — Op Note (Signed)
[facility]  Percutaneous Study/Intervention Procedural Note   Date of Surgery: 08/25/2019  Surgeon(s):Amzie Sillas    Assistants:none  Pre-operative Diagnosis: PAD with ulceration RLE  Post-operative diagnosis:  Same  Procedure(s) Performed:             1.  Ultrasound guidance for vascular access left femoral artery             2.  Catheter placement into right common femoral artery from left femoral approach             3.  Aortogram and selective right lower extremity angiogram             4.  Percutaneous transluminal angioplasty of right tibioperoneal trunk and proximal peroneal artery with 4 mm diameter Lutonix drug-coated angioplasty balloon             5.   Percutaneous transluminal angioplasty of right posterior tibial artery with 3 mm diameter angioplasty balloon from just above the ankle to the origin and a 4 mm diameter Lutonix drug-coated angioplasty balloon proximally  6.  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 The patient has noninvasive study showing reduced flow in the right leg particularly distally. The patient is brought in for angiography for further evaluation and potential treatment.  Due to the limb threatening nature of the situation, angiogram was performed for attempted limb salvage. The patient is aware that if the procedure fails, amputation would be expected.  The patient also understands that even with successful revascularization, amputation may still be required due to the severity of the situation.  Risks and benefits are discussed and informed consent is obtained.   Procedure:  The patient was identified and appropriate procedural time out was performed.  The  patient was then placed supine on the table and prepped and draped in the usual sterile fashion. Moderate conscious sedation was administered during a face to face encounter with the patient throughout the procedure with my supervision of the RN administering medicines and monitoring the patient's vital signs, pulse oximetry, telemetry and mental status throughout from the start of the procedure until the patient was taken to the recovery room. Ultrasound was used to evaluate the left common femoral artery.  It was patent but heavily calcified.  A digital ultrasound image was acquired.  A Seldinger needle was used to access the left common femoral artery under direct ultrasound guidance and a permanent image was performed.  A 0.035 J wire was advanced without resistance and a 5Fr sheath was placed.  Pigtail catheter was placed into the aorta and an AP aortogram was performed. 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  The aorta and iliac arteries were highly calcific but not stenotic. I then crossed the aortic bifurcation and advanced to the right femoral head. Selective right lower extremity angiogram was then performed. This demonstrated highly calcific arteries throughout.  The common femoral artery and profunda femoris artery were widely patent without stenosis.  The SFA and popliteal arteries  were mildly diseased with no greater than 30% stenosis identified.  There was then a typical tibial trifurcation with severe tibial disease.  The anterior tibial artery occluded a few centimeters beyond the origin without distal reconstitution.  The tibioperoneal trunk had a greater than 90% stenosis in the midsegment and then poststenotic dilatation before it split into the peroneal and posterior tibial arteries.  The origins of both vessels had greater than 60% stenosis with  the peroneal artery then normalizing and continuing distally although being small distally.  The posterior tibial artery normalized until the mid segment where there was short segment occlusion and then shortly after that another high-grade stenosis of greater than 90% in the mid to distal segment.  It then normalized and was continuous to the foot although flow was sluggish. It was felt that it was in the patient's best interest to proceed with intervention after these images to avoid a second procedure and a larger amount of contrast and fluoroscopy based off of the findings from the initial angiogram. The patient was systemically heparinized and a 6 [place] [employer]sell sheath was then placed over the [employer] wire. I then used a Kumpe catheter and the 0.018 advantage wire to navigate down into the tibioperoneal trunk and across the stenosis in the tibioperoneal trunk as well as a stenosis in the proximal peroneal artery parking the wire in the mid to distal calf.  I then used a 4 mm diameter by 8 cm length Lutonix drug-coated angioplasty balloon inflated to 10 atm for 1 minute in the tibioperoneal trunk and proximal peroneal artery.  Completion imaging showed less than 20% residual stenosis in the peroneal artery and about a 20 to 25% stenosis in the tibioperoneal trunk.  I then turned my attention to the posterior tibial artery.  Using the Kumpe catheter and the 0.018 advantage wire I got down into the posterior tibial artery and cross the stenoses and occlusions without difficulty parking the wire in the foot.  I then used a 3 mm diameter by 30 cm length angioplasty balloon to balloon from just above the ankle up to the origin of the posterior tibial artery.  This was inflated to 12 atm for 1 minute.  The stenosis in the proximal posterior tibial artery remained, and upsized to a 4 mm diameter by 15 cm length Lutonix drug-coated angioplasty balloon to treat the proximal posterior tibial artery and retreat the  tibioperoneal trunk.  Completion imaging following this showed no greater than 25% residual stenosis the posterior tibial artery or tibioperoneal trunk with improved flow distally. I elected to terminate the procedure. The sheath was removed and StarClose closure device was deployed in the left femoral artery with excellent hemostatic result. The patient was taken to the recovery room in stable condition having tolerated the procedure well.  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  The aorta and iliac arteries were highly calcific but not stenotic             Right lower Extremity:  This demonstrates highly calcific arteries throughout.  The common femoral artery and profunda femoris artery were widely patent without stenosis.  The SFA and popliteal arteries were mildly diseased with no greater than 30% stenosis identified.  There was then  a typical tibial trifurcation with severe tibial disease.  The anterior tibial artery occluded a few centimeters beyond the origin without distal reconstitution.  The tibioperoneal trunk had a greater than 90% stenosis in the midsegment and then poststenotic dilatation before it split into the peroneal and posterior tibial arteries.  The origins of both vessels had greater than 60% stenosis with the peroneal artery then normalizing and continuing distally although being small distally.  The posterior tibial artery normalized until the mid segment where there was short segment occlusion and then shortly after that another high-grade stenosis of greater than 90% in the mid to distal segment.  It then normalized and was continuous to the foot although flow was sluggish.   Disposition: Patient was taken to the recovery room in stable condition having tolerated the procedure  well.  Complications: None  Leotis Pain 08/25/2019 11:50 AM   This note was created with Dragon Medical transcription system. Any errors in dictation are purely unintentional.

## 2019-08-25 NOTE — Discharge Instructions (Signed)
Angiogram, Care After This sheet gives you information about how to care for yourself after your procedure. Your doctor may also give you more specific instructions. If you have problems or questions, contact your doctor. Follow these instructions at home: Insertion site care  Follow instructions from your doctor about how to take care of your long, thin tube (catheter) insertion area. Make sure you: ? Wash your hands with soap and water before you change your bandage (dressing). If you cannot use soap and water, use hand sanitizer. ? Change your bandage as told by your doctor. ? Leave stitches (sutures), skin glue, or skin tape (adhesive) strips in place. They may need to stay in place for 2 weeks or longer. If tape strips get loose and curl up, you may trim the loose edges. Do not remove tape strips completely unless your doctor says it is okay.  Do not take baths, swim, or use a hot tub until your doctor says it is okay.  You may shower 24-48 hours after the procedure or as told by your doctor. ? Gently wash the area with plain soap and water. ? Pat the area dry with a clean towel. ? Do not rub the area. This may cause bleeding.  Do not apply powder or lotion to the area. Keep the area clean and dry.  Check your insertion area every day for signs of infection. Check for: ? More redness, swelling, or pain. ? Fluid or blood. ? Warmth. ? Pus or a bad smell. Activity  Rest as told by your doctor, usually for 1-2 days.  Do not lift anything that is heavier than 10 lbs. (4.5 kg) or as told by your doctor.  Do not drive for 24 hours if you were given a medicine to help you relax (sedative).  Do not drive or use heavy machinery while taking prescription pain medicine. General instructions   Go back to your normal activities as told by your doctor, usually in about a week. Ask your doctor what activities are safe for you.  If the insertion area starts to bleed, lie flat and put  pressure on the area. If the bleeding does not stop, get help right away. This is an emergency.  Drink enough fluid to keep your pee (urine) clear or pale yellow.  Take over-the-counter and prescription medicines only as told by your doctor.  Keep all follow-up visits as told by your doctor. This is important. Contact a doctor if:  You have a fever.  You have chills.  You have more redness, swelling, or pain around your insertion area.  You have fluid or blood coming from your insertion area.  The insertion area feels warm to the touch.  You have pus or a bad smell coming from your insertion area.  You have more bruising around the insertion area.  Blood collects in the tissue around the insertion area (hematoma) that may be painful to the touch. Get help right away if:  You have a lot of pain in the insertion area.  The insertion area swells very fast.  The insertion area is bleeding, and the bleeding does not stop after holding steady pressure on the area.  The area near or just beyond the insertion area becomes pale, cool, tingly, or numb. These symptoms may be an emergency. Do not wait to see if the symptoms will go away. Get medical help right away. Call your local emergency services (911 in the [place] Do not drive yourself to the hospital.   Summary  After the procedure, it is common to have bruising and tenderness at the long, thin tube insertion area.  After the procedure, it is important to rest and drink plenty of fluids.  Do not take baths, swim, or use a hot tub until your doctor says it is okay to do so. You may shower 24-48 hours after the procedure or as told by your doctor.  If the insertion area starts to bleed, lie flat and put pressure on the area. If the bleeding does not stop, get help right away. This is an emergency. This information is not intended to replace advice given to you by your health care provider. Make sure you discuss any questions you have  with your health care provider. Document Revised: 03/06/2017 Document Reviewed: 03/18/2016 Elsevier Patient Education  [place]. Moderate Conscious Sedation, Adult, Care After These instructions provide you with information about caring for yourself after your procedure. Your health care provider may also give you more specific instructions. Your treatment has been planned according to current medical practices, but problems sometimes occur. Call your health care provider if you have any problems or questions after your procedure. What can I expect after the procedure? After your procedure, it is common:  To feel sleepy for several hours.  To feel clumsy and have poor balance for several hours.  To have poor judgment for several hours.  To vomit if you eat too soon. Follow these instructions at home: For at least 24 hours after the procedure:   Do not: ? Participate in activities where you could fall or become injured. ? Drive. ? Use heavy machinery. ? Drink alcohol. ? Take sleeping pills or medicines that cause drowsiness. ? Make important decisions or sign legal documents. ? Take care of children on your own.  Rest. Eating and drinking  Follow the diet recommended by your health care provider.  If you vomit: ? Drink water, juice, or soup when you can drink without vomiting. ? Make sure you have little or no nausea before eating solid foods. General instructions  Have a responsible adult stay with you until you are awake and alert.  Take over-the-counter and prescription medicines only as told by your health care provider.  If you smoke, do not smoke without supervision.  Keep all follow-up visits as told by your health care provider. This is important. Contact a health care provider if:  You keep feeling nauseous or you keep vomiting.  You feel light-headed.  You develop a rash.  You have a fever. Get help right away if:  You have trouble  breathing. This information is not intended to replace advice given to you by your health care provider. Make sure you discuss any questions you have with your health care provider. Document Revised: 03/06/2017 Document Reviewed: 07/14/2015 Elsevier Patient Education  [place]. Femoral Site Care This sheet gives you information about how to care for yourself after your procedure. Your health care provider may also give you more specific instructions. If you have problems or questions, contact your health care provider. What can I expect after the procedure? After the procedure, it is common to have:  Bruising that usually fades within 1-2 weeks.  Tenderness at the site. Follow these instructions at home: Wound care  Follow instructions from your health care provider about how to take care of your insertion site. Make sure you: ? Wash your hands with soap and water before you change your bandage (dressing). If soap  and water are not available, use hand sanitizer. ? Change your dressing as told by your health care provider. ? Leave stitches (sutures), skin glue, or adhesive strips in place. These skin closures may need to stay in place for 2 weeks or longer. If adhesive strip edges start to loosen and curl up, you may trim the loose edges. Do not remove adhesive strips completely unless your health care provider tells you to do that.  Do not take baths, swim, or use a hot tub until your health care provider approves.  You may shower 24-48 hours after the procedure or as told by your health care provider. ? Gently wash the site with plain soap and water. ? Pat the area dry with a clean towel. ? Do not rub the site. This may cause bleeding.  Do not apply powder or lotion to the site. Keep the site clean and dry.  Check your femoral site every day for signs of infection. Check for: ? Redness, swelling, or pain. ? Fluid or blood. ? Warmth. ? Pus or a bad smell. Activity  For  the first 2-3 days after your procedure, or as long as directed: ? Avoid climbing stairs as much as possible. ? Do not squat.  Do not lift anything that is heavier than 10 lb (4.5 kg), or the limit that you are told, until your health care provider says that it is safe.  Rest as directed. ? Avoid sitting for a long time without moving. Get up to take short walks every 1-2 hours.  Do not drive for 24 hours if you were given a medicine to help you relax (sedative). General instructions  Take over-the-counter and prescription medicines only as told by your health care provider.  Keep all follow-up visits as told by your health care provider. This is important. Contact a health care provider if you have:  A fever or chills.  You have redness, swelling, or pain around your insertion site. Get help right away if:  The catheter insertion area swells very fast.  You pass out.  You suddenly start to sweat or your skin gets clammy.  The catheter insertion area is bleeding, and the bleeding does not stop when you hold steady pressure on the area.  The area near or just beyond the catheter insertion site becomes pale, cool, tingly, or numb. These symptoms may represent a serious problem that is an emergency. Do not wait to see if the symptoms will go away. Get medical help right away. Call your local emergency services (911 in the [place] Do not drive yourself to the hospital. Summary  After the procedure, it is common to have bruising that usually fades within 1-2 weeks.  Check your femoral site every day for signs of infection.  Do not lift anything that is heavier than 10 lb (4.5 kg), or the limit that you are told, until your health care provider says that it is safe. This information is not intended to replace advice given to you by your health care provider. Make sure you discuss any questions you have with your health care provider. Document Revised: 04/06/2017 Document Reviewed:  04/06/2017 Elsevier Patient Education  2020 [employer].

## 2019-08-29 ENCOUNTER — Telehealth: Payer: Self-pay

## 2019-08-29 ENCOUNTER — Ambulatory Visit (INDEPENDENT_AMBULATORY_CARE_PROVIDER_SITE_OTHER): Payer: Medicare Other

## 2019-08-29 ENCOUNTER — Encounter: Payer: Self-pay | Admitting: Cardiovascular Disease

## 2019-08-29 DIAGNOSIS — Z5181 Encounter for therapeutic drug level monitoring: Secondary | ICD-10-CM | POA: Diagnosis not present

## 2019-08-29 DIAGNOSIS — Z952 Presence of prosthetic heart valve: Secondary | ICD-10-CM | POA: Diagnosis not present

## 2019-08-29 DIAGNOSIS — I4821 Permanent atrial fibrillation: Secondary | ICD-10-CM | POA: Diagnosis not present

## 2019-08-29 LAB — LAB REPORT - SCANNED: INR: 1.8

## 2019-08-29 LAB — POCT INR: INR: 1.8 — AB (ref 2.0–3.0)

## 2019-08-29 NOTE — Patient Instructions (Signed)
Sent pt MyChart message advising him to take 2 mg warfarin tonight, then resume dosage of 1 mg every day. Recheck INR in 1 week.

## 2019-08-29 NOTE — Telephone Encounter (Signed)
Patient calling in INR reading of 1.8   Patient also wanted to mention he took 1 mg every day except last Friday, pt took 1.5 mg on Friday

## 2019-08-29 NOTE — Telephone Encounter (Signed)
Please see anti-coag note for today. 

## 2019-08-30 ENCOUNTER — Telehealth: Payer: Self-pay | Admitting: Family Medicine

## 2019-08-30 ENCOUNTER — Encounter: Payer: Medicare Other | Admitting: *Deleted

## 2019-08-30 ENCOUNTER — Other Ambulatory Visit: Payer: Self-pay

## 2019-08-30 DIAGNOSIS — Z955 Presence of coronary angioplasty implant and graft: Secondary | ICD-10-CM | POA: Diagnosis not present

## 2019-08-30 NOTE — Progress Notes (Signed)
Daily Session Note  Patient Details  Name: Jeremy [name] MRN: [id number] Date of Birth: May 27, 1955 Referring Provider:     Cardiac Rehab from 08/02/2019 in [facility] Cardiac and Pulmonary Rehab  Referring Provider  Kathlyn Sacramento MD      Encounter Date: 08/30/2019  Check In: Session Check In - 08/30/19 0829      Check-In   Supervising physician immediately available to respond to emergencies  See telemetry face sheet for immediately available ER MD    Location  [facility]-Cardiac & Pulmonary Rehab    Staff Present  Heath Lark, RN, BSN, CCRP;Joseph Foy [place], [place], ACSM CEP, Exercise Physiologist    Virtual Visit  No    Medication changes reported      No    Fall or balance concerns reported     No    Warm-up and Cool-down  Performed on first and last piece of equipment    Resistance Training Performed  Yes    VAD Patient?  No    PAD/SET Patient?  No      Pain Assessment   Currently in Pain?  No/denies          Social History   Tobacco Use  Smoking Status Never Smoker  Smokeless Tobacco Never Used    Goals Met:  Independence with exercise equipment Exercise tolerated well No report of cardiac concerns or symptoms  Goals Unmet:  Not Applicable  Comments: Pt able to follow exercise prescription today without complaint.  Will continue to monitor for progression.    Dr. Emily Filbert is Medical Director for [place] and [facility].

## 2019-08-30 NOTE — Telephone Encounter (Signed)
Please call the patient.  Please let him know that I received a fax from his insurance company.  They noted that the Prozac could diminish the effectiveness of his Plavix.  Is he willing to switch the Prozac to an alternative medication to help with his depression?

## 2019-08-30 NOTE — Telephone Encounter (Signed)
I called and informed the patient that the Prozac could diminish the effects of the Plavix and I asked if he waas willing to switch from Prozac to an alternative medication for depression and he stated no he wanted to hold off on that and he will get back in touch with you soon about it.  Nina,cma

## 2019-08-31 NOTE — Telephone Encounter (Signed)
Noted  

## 2019-09-01 ENCOUNTER — Other Ambulatory Visit: Payer: Self-pay

## 2019-09-01 ENCOUNTER — Encounter: Payer: Medicare Other | Admitting: *Deleted

## 2019-09-01 DIAGNOSIS — Z955 Presence of coronary angioplasty implant and graft: Secondary | ICD-10-CM

## 2019-09-01 NOTE — Progress Notes (Signed)
Daily Session Note  Patient Details  Name: Jeremy [name] MRN: [id number] Date of Birth: 01/08/1956 Referring Provider:     Cardiac Rehab from 08/02/2019 in [facility] Cardiac and Pulmonary Rehab  Referring Provider  Kathlyn Sacramento MD      Encounter Date: 09/01/2019  Check In: Session Check In - 09/01/19 0829      Check-In   Supervising physician immediately available to respond to emergencies  See telemetry face sheet for immediately available ER MD    Location  [facility]-Cardiac & Pulmonary Rehab    Staff Present  Heath Lark, RN, BSN, CCRP;Jessica Humptulips, MA, RCEP, CCRP, CCET;Melissa Hackberry RDN, LDN;Amanda Sommer, [place], ACSM CEP, Exercise Physiologist    Virtual Visit  No    Medication changes reported      No    Fall or balance concerns reported     No    Warm-up and Cool-down  Performed on first and last piece of equipment    Resistance Training Performed  Yes    VAD Patient?  No    PAD/SET Patient?  No      Pain Assessment   Currently in Pain?  No/denies          Social History   Tobacco Use  Smoking Status Never Smoker  Smokeless Tobacco Never Used    Goals Met:  Independence with exercise equipment Exercise tolerated well No report of cardiac concerns or symptoms  Goals Unmet:  Not Applicable  Comments: Pt able to follow exercise prescription today without complaint.  Will continue to monitor for progression.    Dr. Emily Filbert is Medical Director for [place] and [facility].

## 2019-09-04 ENCOUNTER — Encounter (INDEPENDENT_AMBULATORY_CARE_PROVIDER_SITE_OTHER): Payer: Self-pay

## 2019-09-06 ENCOUNTER — Encounter: Payer: Self-pay | Admitting: Cardiovascular Disease

## 2019-09-06 ENCOUNTER — Encounter (INDEPENDENT_AMBULATORY_CARE_PROVIDER_SITE_OTHER): Payer: Self-pay | Admitting: Vascular Surgery

## 2019-09-06 ENCOUNTER — Other Ambulatory Visit: Payer: Self-pay

## 2019-09-06 ENCOUNTER — Other Ambulatory Visit (INDEPENDENT_AMBULATORY_CARE_PROVIDER_SITE_OTHER): Payer: Self-pay | Admitting: Vascular Surgery

## 2019-09-06 ENCOUNTER — Ambulatory Visit (INDEPENDENT_AMBULATORY_CARE_PROVIDER_SITE_OTHER): Payer: Medicare Other | Admitting: Vascular Surgery

## 2019-09-06 ENCOUNTER — Ambulatory Visit (INDEPENDENT_AMBULATORY_CARE_PROVIDER_SITE_OTHER): Payer: Medicare Other

## 2019-09-06 ENCOUNTER — Ambulatory Visit (INDEPENDENT_AMBULATORY_CARE_PROVIDER_SITE_OTHER): Payer: Medicare Other | Admitting: Cardiovascular Disease

## 2019-09-06 VITALS — BP 140/60 | HR 87 | Ht 70.0 in | Wt 189.5 lb

## 2019-09-06 VITALS — BP 98/56 | HR 106 | Resp 15 | Wt 188.2 lb

## 2019-09-06 DIAGNOSIS — I7025 Atherosclerosis of native arteries of other extremities with ulceration: Secondary | ICD-10-CM

## 2019-09-06 DIAGNOSIS — I482 Chronic atrial fibrillation, unspecified: Secondary | ICD-10-CM | POA: Diagnosis not present

## 2019-09-06 DIAGNOSIS — I359 Nonrheumatic aortic valve disorder, unspecified: Secondary | ICD-10-CM | POA: Diagnosis not present

## 2019-09-06 DIAGNOSIS — N186 End stage renal disease: Secondary | ICD-10-CM | POA: Diagnosis not present

## 2019-09-06 DIAGNOSIS — I10 Essential (primary) hypertension: Secondary | ICD-10-CM | POA: Diagnosis not present

## 2019-09-06 DIAGNOSIS — I5022 Chronic systolic (congestive) heart failure: Secondary | ICD-10-CM | POA: Diagnosis not present

## 2019-09-06 DIAGNOSIS — I2510 Atherosclerotic heart disease of native coronary artery without angina pectoris: Secondary | ICD-10-CM

## 2019-09-06 DIAGNOSIS — E785 Hyperlipidemia, unspecified: Secondary | ICD-10-CM | POA: Diagnosis not present

## 2019-09-06 DIAGNOSIS — Z9862 Peripheral vascular angioplasty status: Secondary | ICD-10-CM

## 2019-09-06 NOTE — Progress Notes (Signed)
Cardiology Office Note   Date:  09/06/2019   ID:  Jeremy [name], DOB Apr 28, 1955, MRN [id number]  PCP:  Jeremy Haven, MD  Cardiologist:   Jeremy Sacramento, MD   Chief Complaint  Patient presents with   office visit    Pt concerned w/ Left leg. Meds verbally reviewed w/ pt.       History of Present Illness: Jeremy [name] is a 64 y.o. male who is here today for follow-up visit regarding coronary artery disease.   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  He had an echocardiogram done in January 2020 which showed a drop in ejection fraction from normal to 35%, moderately reduced RV function, moderate biatrial enlargement, [employer] mechanical aortic valve with mean gradient of 21 mmHg, mild mitral regurgitation and mild pulmonary hypertension.  He was on peritoneal dialysis before and was switched to hemodialysis in August 2019 due to ineffective fluid removal.  The patient had worsening angina in early 2020 with abnormal nuclear stress test.  He underwent cardiac catheterization which showed severe heavily calcified two-vessel coronary artery disease affecting the LAD and right coronary artery.  He underwent CABG by Dr. Roxan Hockey with LIMA to LAD, SVG to diagonal and SVG to right PDA. He had improvement in symptoms but then started having issues with A. fib with RVR.  He was on amiodarone for rate control at some point but then was discontinued given chronicity and improvement in ventricular rate with metoprolol.  Subsequently, he was switched to carvedilol but ventricular  rate worsened.  He had recent recurrent angina.  Nuclear stress test in April showed large fixed defect in the inferior and inferolateral wall consistent with an infarct as well as inferior ischemia and ejection fraction of 27%.  Due to these findings, I proceeded with cardiac catheterization in April which showed significant underlying two-vessel coronary artery disease with patent LIMA to LAD and occluded SVG to diagonal.  Diagonal was getting collaterals from the LAD.  SVG to right PDA was patent but had 99% ostial stenosis.  I performed successful angioplasty and drug-eluting stent placement to SVG to right PDA.  He had small hematoma after the procedure that did not require intervention.  Carvedilol was switched to metoprolol for better rate control. He was discharged home on Plavix without aspirin given that he is on anticoagulation with warfarin.    He had significant improvement in symptoms since then.  No chest pain.  Shortness of breath improved.  Ventricular rate improved with metoprolol.  He had nonhealing ulceration involving the right foot with peripheral arterial disease.  He had an angiogram done by Dr. Lucky [name] with revascularization of tibial vessels.  He had left groin hematoma after bath likely given the he is on anticoagulation with warfarin.   Past Medical History:  Diagnosis Date   Abnormal stress test 03/19/2018   Anemia in chronic kidney disease 07/04/2015   Aortic stenosis    a. 2014 s/p mech AVR ([facility])-->chronic coumadin; b. 08/2018 Echo: Mild AS/AI. Nl fxn/gradient.   Arthritis    Atrial fibrillation (HCC)    CAD (coronary artery disease)  a. 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 02/2018 MV [facility]): large inf, infsept, inflat infarct w/ min rev, EF 30%; c. 06/2018 Cath: LM min irregs, LAD 60p, 95/30/47m, LCX min irregs, RCA 40p, 21m, 90d, RPAV 90, RPDA 95ost; c. 09/2018 CABGx3 (LIMA->LAD, VG->D1, VG->PDA).    Chronic anticoagulation 06/2012   a. Coumadin in the setting of mech AVR and afib.   COVID-19 03/15/2019   CPAP (continuous positive airway pressure) dependence    Dialysis patient ([place]) 04/07/2006   Patient started HD around 1996,  had 1st transplant LLQ around Jeremy [name] until 2007 when they found renal cell cancer in transplant and both native kidneys, all were removed > went back on HD until 2nd transplant RLQ in 2014 which lasted 3 yrs; it was embolized for suspected acute rejection. Is currently on HD MWF in [place] w/ [facility] nephrology.   ESRD (end stage renal disease) ([place])    a. 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 1999 s/p Kidney transplant; c. 2014 Bilat nephrectomy (transplanted kidney resected) in the setting of renal cell carcinoma; d. PD until 11/2017-->HD.   Heart murmur    HFrEF (heart failure with reduced ejection fraction) ([place])    a. 07/2016 Echo: EF 55-60%; b. 02/2018 MV: EF 30%; c. 04/2018 Echo: EF 35%; d. 08/2018 Echo: EF 30-35%. Glob HK w/o rwma. Mildly reduced RV fxn. Mod to sev dil LA. Nl AoV fxn.   Hx of colonoscopy    2009 by Dr. Laural [name]. Normal except for small submucosal lipoma. No evidence of polyps or colitis.   Hyperlipidemia    Hypertension    Hypogonadism in male 07/25/2015   Ischemic cardiomyopathy    a. 07/2016 Echo: EF 55-60%; b. 02/2018 MV: EF 30%; c. 04/2018 Echo: EF 35%; d. 08/2018 Echo: EF 30-35%.   OSA (obstructive sleep apnea)    No CPAP   Permanent atrial fibrillation (HCC)    a. CHA2DS2VASc = 3-->Coumadin (also mech AVR); b. 06/2018 Amio started 2/2 elevated rates.   Renal cell cancer ([place])    Renal cell carcinoma ([place]) 08/04/2016    Past Surgical History:  Procedure Laterality Date   AORTIC VALVE REPLACEMENT  3 /11 /2014   At [place]     CAPD INSERTION N/A 02/19/2017   Procedure: LAPAROSCOPIC INSERTION CONTINUOUS AMBULATORY PERITONEAL DIALYSIS  (CAPD) CATHETER;  Surgeon: Jeremy Huxley, MD;  Location: [facility];  Service: General;  Laterality: N/A;   CORONARY ANGIOPLASTY WITH STENT PLACEMENT     CORONARY ARTERY BYPASS GRAFT N/A 09/16/2018   Procedure: CORONARY ARTERY BYPASS GRAFT times three      with left internal mammary artery and endoharvest of right leg greater saphenous vein;  Surgeon: Melrose Nakayama, MD;  Location: [place];  Service: Open Heart Surgery;  Laterality: N/A;   CORONARY STENT INTERVENTION N/A 07/15/2019   Procedure: CORONARY STENT INTERVENTION;  Surgeon: Wellington Hampshire, MD;  Location: [place] [facility];  Service: Cardiovascular;  Laterality: N/A;   DG AV DIALYSIS  SHUNT ACCESS EXIST*L* OR     left arm   EPIGASTRIC HERNIA REPAIR N/A 02/19/2017   Procedure: HERNIA REPAIR EPIGASTRIC ADULT;  Surgeon: Robert Bellow, MD;  Location: [facility];  Service: General;  Laterality: N/A;   EYE SURGERY     bilateral cataract   HERNIA REPAIR     UMBILICAL HERNIA  HERNIA REPAIR  02/19/2017   INNER EAR SURGERY     20 years ago   KIDNEY TRANSPLANT     LEFT HEART CATH AND CORS/GRAFTS ANGIOGRAPHY N/A 07/15/2019   Procedure: LEFT HEART CATH AND CORS/GRAFTS ANGIOGRAPHY;  Surgeon: Wellington Hampshire, MD;  Location: [place] [facility];  Service: Cardiovascular;  Laterality: N/A;   LOWER EXTREMITY ANGIOGRAPHY Right 08/25/2019   Procedure: LOWER EXTREMITY ANGIOGRAPHY;  Surgeon: Jeremy Huxley, MD;  Location: [place] [facility];  Service: Cardiovascular;  Laterality: Right;   Peritoneal Dialysis access placement  02/19/2017   REMOVAL OF A DIALYSIS CATHETER N/A 11/23/2017   Procedure: REMOVAL OF A PERITONEAL DIALYSIS CATHETER;  Surgeon: Jeremy Huxley, MD;  Location: [facility];  Service: Vascular;  Laterality: N/A;   RIGHT/LEFT HEART CATH AND CORONARY ANGIOGRAPHY Bilateral 06/07/2018   Procedure: RIGHT/LEFT HEART CATH AND CORONARY ANGIOGRAPHY;  Surgeon: Wellington Hampshire, MD;  Location: [place] [facility];  Service: Cardiovascular;  Laterality: Bilateral;   TEE  WITHOUT CARDIOVERSION N/A 07/21/2016   Procedure: TRANSESOPHAGEAL ECHOCARDIOGRAM (TEE);  Surgeon: Wellington Hampshire, MD;  Location: [facility];  Service: Cardiovascular;  Laterality: N/A;   TEE WITHOUT CARDIOVERSION N/A 09/16/2018   Procedure: TRANSESOPHAGEAL ECHOCARDIOGRAM (TEE);  Surgeon: Melrose Nakayama, MD;  Location: [place];  Service: Open Heart Surgery;  Laterality: N/A;     Current Outpatient Medications  Medication Sig Dispense Refill   acetaminophen (TYLENOL) 500 MG tablet Take 1,000 mg 2 (two) times daily as needed by mouth for moderate pain.     allopurinol (ZYLOPRIM) 300 MG tablet Take 300 mg by mouth daily.  5   cinacalcet (SENSIPAR) 30 MG tablet Take 30 mg by mouth every other day.      clopidogrel (PLAVIX) 75 MG tablet Take 1 tablet (75 mg total) by mouth daily with breakfast. 90 tablet 3   ezetimibe (ZETIA) 10 MG tablet Take 1 tablet (10 mg total) by mouth daily. 90 tablet 3   FLUoxetine (PROZAC) 20 MG tablet Take 1 tablet (20 mg total) by mouth daily. 30 tablet 3   hydroxychloroquine (PLAQUENIL) 200 MG tablet Take 200 mg by mouth 2 (two) times daily.     lidocaine-prilocaine (EMLA) cream Apply 1 application topically as needed (for fistula).     loratadine (CLARITIN) 10 MG tablet Take 10 mg by mouth daily.     metoprolol tartrate (LOPRESSOR) 25 MG tablet Take 1 tablet (25 mg total) by mouth in the morning, at noon, and at bedtime. 270 tablet 3   multivitamin (RENA-VIT) TABS tablet Take 1 tablet by mouth daily.     nitroGLYCERIN (NITROSTAT) 0.4 MG SL tablet Place 1 tablet (0.4 mg total) under the tongue every 5 (five) minutes as needed for chest pain. 25 tablet 3   pantoprazole (PROTONIX) 40 MG tablet Take 1 tablet (40 mg total) by mouth daily. 90 tablet 3   predniSONE (DELTASONE) 5 MG tablet Take 5 mg by mouth daily with breakfast.      rosuvastatin (CRESTOR) 40 MG tablet Take 1 tablet (40 mg total) by mouth daily at 6 PM. 90 tablet 3   sevelamer carbonate  (RENVELA) 800 MG tablet Take 4,000 mg by mouth 3 (three) times daily with meals. 5 tabs per meal      warfarin (COUMADIN) 1 MG tablet Take 1-1.5 tablets (1-1.5 mg total) by mouth See admin instructions. Take 1mg  on Sun, Tues, Thur and Sat Take 1.5mg  on Mon, Wed, Fri 180 tablet 1   No current  facility-administered medications for this visit.    Allergies:   Statins and Sertraline    Social History:  The patient  reports that he has never smoked. He has never used smokeless tobacco. He reports that he does not drink alcohol or use drugs.   Family History:  The patient's family history includes Cancer in his mother; Diabetes in his father; Heart disease in his father.    ROS:  Please see the history of present illness.   Otherwise, review of systems are positive for none.   All other systems are reviewed and negative.    PHYSICAL EXAM: VS:  BP 140/60 (BP Location: Right Arm, Patient Position: Sitting, Cuff Size: Normal)    Pulse 87    Ht 5\' 10"  (1.778 m)    Wt 189 lb 8 oz (86 kg)    SpO2 98%    BMI 27.19 kg/m  , BMI Body mass index is 27.19 kg/m. GEN: Well nourished, well developed, in no acute distress  HEENT: normal  Neck: Mild JVD, no carotid bruits, or masses Cardiac: Irregularly irregular; normal mechanical heart sounds with 1 out of 6 systolic murmur at the aortic area . Respiratory:  clear to auscultation bilaterally, normal work of breathing GI: soft, nontender, nondistended, + BS MS: no deformity or atrophy  Skin: warm and dry, no rash Neuro:  Strength and sensation are intact Psych: euthymic mood, full affect Vascular: The patient has a fistula in the left arm.     EKG:  EKG is ordered today. The ekg ordered today demonstrates atrial fibrillation with right bundle branch block   Recent Labs: 03/15/2019: B Natriuretic Peptide 1,821.0 03/17/2019: Magnesium 2.6 06/21/2019: ALT 19 07/17/2019: Hemoglobin 9.9; Platelets 101 07/18/2019: BUN 51; Creatinine, Ser 10.71; Potassium  4.8; Sodium 136    Lipid Panel    Component Value Date/Time   CHOL 136 06/21/2019 0932   CHOL 179 04/26/2019 0938   TRIG 150 (H) 06/21/2019 0932   HDL 33 (L) 06/21/2019 0932   HDL 38 (L) 04/26/2019 0938   CHOLHDL 4.1 06/21/2019 0932   VLDL 30 06/21/2019 0932   LDLCALC 73 06/21/2019 0932   LDLCALC 111 (H) 04/26/2019 0938   LDLDIRECT 61.0 08/11/2017 1447      Wt Readings from Last 3 Encounters:  09/06/19 189 lb 8 oz (86 kg)  09/06/19 188 lb 3.2 oz (85.4 kg)  08/25/19 187 lb (84.8 kg)       No flowsheet data found.    ASSESSMENT AND PLAN:  1.  Coronary artery disease involving native coronary arteries without angina: He feels significantly better after recent PCI and drug-eluting stent placement to SVG to right PDA.  Continue clopidogrel without aspirin given that he is on warfarin.   2.  Chronic systolic heart failure: Most likely due to ischemic cardiomyopathy.  Most recent echo in February showed an EF of 40 to 45%.    He is not on an ACE inhibitor, ARB or other vasodilators due to low blood pressure.   3.  Chronic atrial fibrillation: On long-term anticoagulation with warfarin given mechanical aortic valve.  Ventricular rate has been reasonably controlled since increasing metoprolol to 3 times daily.  4.  Hyperlipidemia: He is tolerating high-dose rosuvastatin and Zetia.  He will require a follow-up lipid profile in the near future.  5.  Mechanical aortic valve replacement: Continue long-term anticoagulation with warfarin.  6.  End-stage renal disease on hemodialysis Monday Wednesday and Friday.  7.  Peripheral arterial disease: Status post endovascular  intervention to the right lower extremity by Dr. Lucky [name].   Disposition:   FU with me in 6 months  Signed,  Jeremy Sacramento, MD  09/06/2019 4:38 PM    [place]

## 2019-09-06 NOTE — Patient Instructions (Signed)
Peripheral Vascular Disease  Peripheral vascular disease (PVD) is a disease of the blood vessels that are not part of your heart and brain. A simple term for PVD is poor circulation. In most cases, PVD narrows the blood vessels that carry blood from your heart to the rest of your body. This can reduce the supply of blood to your arms, legs, and internal organs, like your stomach or kidneys. However, PVD most often affects a person's lower legs and feet. Without treatment, PVD tends to get worse. PVD can also lead to acute ischemic limb. This is when an arm or leg suddenly cannot get enough blood. This is a medical emergency. Follow these instructions at home: Lifestyle  Do not use any products that contain nicotine or tobacco, such as cigarettes and e-cigarettes. If you need help quitting, ask your doctor.  Lose weight if you are overweight. Or, stay at a healthy weight as told by your doctor.  Eat a diet that is low in fat and cholesterol. If you need help, ask your doctor.  Exercise regularly. Ask your doctor for activities that are right for you. General instructions  Take over-the-counter and prescription medicines only as told by your doctor.  Take good care of your feet: ? Wear comfortable shoes that fit well. ? Check your feet often for any cuts or sores.  Keep all follow-up visits as told by your doctor This is important. Contact a doctor if:  You have cramps in your legs when you walk.  You have leg pain when you are at rest.  You have coldness in a leg or foot.  Your skin changes.  You are unable to get or have an erection (erectile dysfunction).  You have cuts or sores on your feet that do not heal. Get help right away if:  Your arm or leg turns cold, numb, and blue.  Your arms or legs become red, warm, swollen, painful, or numb.  You have chest pain.  You have trouble breathing.  You suddenly have weakness in your face, arm, or leg.  You become very  confused or you cannot speak.  You suddenly have a very bad headache.  You suddenly cannot see. Summary  Peripheral vascular disease (PVD) is a disease of the blood vessels.  A simple term for PVD is poor circulation. Without treatment, PVD tends to get worse.  Treatment may include exercise, low fat and low cholesterol diet, and quitting smoking. This information is not intended to replace advice given to you by your health care provider. Make sure you discuss any questions you have with your health care provider. Document Revised: 03/06/2017 Document Reviewed: 05/01/2016 Elsevier Patient Education  2020 [employer]  

## 2019-09-06 NOTE — Patient Instructions (Signed)

## 2019-09-06 NOTE — Assessment & Plan Note (Signed)
ABIs remain noncompressible bilaterally.  His previous digital pressure was in the 60s but his digital pressure today is one hundred and thirty-one on the right.  His left digital pressure is eighty-one.  He has pretty good digital waveforms bilaterally.  His postoperative bruising was more than most secondary to continued anticoagulation around the time of the procedure for his heart valve.  It is improving and remains mild to moderate.  He likely has had some nerve irritation as well but this should gradually improve.  He is going to use warm compresses and anti-inflammatories as tolerated.  We will plan to see him back in 3 months with noninvasive studies or sooner if problems develop in the interim.  Continue current medical regimen including full anticoagulation.

## 2019-09-06 NOTE — Progress Notes (Signed)
MRN : [id number]  Jeremy [name] is a 64 y.o. ([birth year]) male who presents with chief complaint of  Chief Complaint  Patient presents with  . Follow-up    [facility] 4wk post lle angio  .  History of Present Illness: Patient returns today in follow up of his PAD with ulceration earlier than her scheduled visit.  He had an angiogram about a week and a half ago.  His right leg has done very well after this.  He is beginning to see epithelialization of his ulcer.  His left leg developed significant bruising similar to what he had after cardiac catheterization on the right side a few weeks earlier.  He had to maintain his anticoagulation secondary to his aortic valve disease and so he had more bruising than typical.  This is not a significant hematoma.  He has had some neuropathic pain in the left thigh area.  We did go ahead and check his perfusion today and his ABIs remain noncompressible bilaterally.  His previous digital pressure was in the 60s but his digital pressure today is one hundred and thirty-one on the right.  His left digital pressure is eighty-one.  He has pretty good digital waveforms bilaterally.  Current Outpatient Medications  Medication Sig Dispense Refill  . acetaminophen (TYLENOL) 500 MG tablet Take 1,000 mg 2 (two) times daily as needed by mouth for moderate pain.    Marland Kitchen allopurinol (ZYLOPRIM) 300 MG tablet Take 300 mg by mouth daily.  5  . cinacalcet (SENSIPAR) 30 MG tablet Take 30 mg by mouth every other day.     . clopidogrel (PLAVIX) 75 MG tablet Take 1 tablet (75 mg total) by mouth daily with breakfast. 90 tablet 3  . FLUoxetine (PROZAC) 20 MG tablet Take 1 tablet (20 mg total) by mouth daily. 30 tablet 3  . hydroxychloroquine (PLAQUENIL) 200 MG tablet Take 200 mg by mouth 2 (two) times daily.    Marland Kitchen lidocaine-prilocaine (EMLA) cream Apply 1 application topically as needed (for fistula).    . loratadine (CLARITIN) 10 MG tablet Take 10 mg by mouth daily.    . metoprolol tartrate  (LOPRESSOR) 25 MG tablet Take 1 tablet (25 mg total) by mouth in the morning, at noon, and at bedtime. 270 tablet 3  . multivitamin (RENA-VIT) TABS tablet Take 1 tablet by mouth daily.    . nitroGLYCERIN (NITROSTAT) 0.4 MG SL tablet Place 1 tablet (0.4 mg total) under the tongue every 5 (five) minutes as needed for chest pain. 25 tablet 3  . pantoprazole (PROTONIX) 40 MG tablet Take 1 tablet (40 mg total) by mouth daily. 90 tablet 3  . predniSONE (DELTASONE) 5 MG tablet Take 5 mg by mouth daily with breakfast.     . rosuvastatin (CRESTOR) 40 MG tablet Take 1 tablet (40 mg total) by mouth daily at 6 PM. 90 tablet 3  . sevelamer carbonate (RENVELA) 800 MG tablet Take 4,000 mg by mouth 3 (three) times daily with meals. 5 tabs per meal     . warfarin (COUMADIN) 1 MG tablet Take 1-1.5 tablets (1-1.5 mg total) by mouth See admin instructions. Take 1mg  on Sun, Tues, Thur and Sat Take 1.5mg  on Mon, Wed, Fri 180 tablet 1  . ezetimibe (ZETIA) 10 MG tablet Take 1 tablet (10 mg total) by mouth daily. 90 tablet 3   No current facility-administered medications for this visit.    Past Medical History:  Diagnosis Date  . Abnormal stress test 03/19/2018  . Anemia  in chronic kidney disease 07/04/2015  . Aortic stenosis    a. 2014 s/p mech AVR ([facility])-->chronic coumadin; b. 08/2018 Echo: Mild AS/AI. Nl fxn/gradient.  . Arthritis   . Atrial fibrillation ([place])   . CAD (coronary artery disease)    a. 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 02/2018 MV [facility]): large inf, infsept, inflat infarct w/ min rev, EF 30%; c. 06/2018 Cath: LM min irregs, LAD 60p, 95/30/18m, LCX min irregs, RCA 40p, 33m, 90d, RPAV 90, RPDA 95ost; c. 09/2018 CABGx3 (LIMA->LAD, VG->D1, VG->PDA).  . Chronic anticoagulation 06/2012   a. Coumadin in the setting of mech AVR and afib.  Marland Kitchen COVID-19 03/15/2019  . CPAP (continuous positive airway pressure) dependence   . Dialysis patient [facility])  04/07/2006   Patient started HD around 1996,  had 1st transplant LLQ around 1998 until 2007 when they found renal cell cancer in transplant and both native kidneys, all were removed > went back on HD until 2nd transplant RLQ in 2014 which lasted 3 yrs; it was embolized for suspected acute rejection. Is currently on HD MWF in [place] w/ [facility] nephrology.  . ESRD (end stage renal disease) ([place])    a. 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 1999 s/p Kidney transplant; c. 2014 Bilat nephrectomy (transplanted kidney resected) in the setting of renal cell carcinoma; d. PD until 11/2017-->HD.  Marland Kitchen Heart murmur   . HFrEF (heart failure with reduced ejection fraction) ([place])    a. 07/2016 Echo: EF 55-60%; b. 02/2018 MV: EF 30%; c. 04/2018 Echo: EF 35%; d. 08/2018 Echo: EF 30-35%. Glob HK w/o rwma. Mildly reduced RV fxn. Mod to sev dil LA. Nl AoV fxn.  Marland Kitchen Hx of colonoscopy    2009 by Dr. Laural Golden. Normal except for small submucosal lipoma. No evidence of polyps or colitis.  . Hyperlipidemia   . Hypertension   . Hypogonadism in male 07/25/2015  . Ischemic cardiomyopathy    a. 07/2016 Echo: EF 55-60%; b. 02/2018 MV: EF 30%; c. 04/2018 Echo: EF 35%; d. 08/2018 Echo: EF 30-35%.  . OSA (obstructive sleep apnea)    No CPAP  . Permanent atrial fibrillation ([facility])    a. CHA2DS2VASc = 3-->Coumadin (also mech AVR); b. 06/2018 Amio started 2/2 elevated rates.  . Renal cell cancer ([place])   . Renal cell carcinoma ([place]) 08/04/2016    Past Surgical History:  Procedure Laterality Date  . AORTIC VALVE REPLACEMENT  3 /11 /2014   At [facility]    . CAPD INSERTION N/A 02/19/2017   Procedure: LAPAROSCOPIC INSERTION CONTINUOUS AMBULATORY PERITONEAL DIALYSIS  (CAPD) CATHETER;  Surgeon: Algernon Huxley, MD;  Location: [facility] OR[facility];  Service: General;  Laterality: N/A;  . CORONARY ANGIOPLASTY WITH STENT PLACEMENT    . CORONARY ARTERY BYPASS GRAFT N/A 09/16/2018   Procedure: CORONARY ARTERY  BYPASS GRAFT times three      with left internal mammary artery and endoharvest of right leg greater saphenous vein;  Surgeon: Melrose Nakayama, MD;  Location: [facility];  Service: Open Heart Surgery;  Laterality: N/A;  . CORONARY STENT INTERVENTION N/A 07/15/2019   Procedure: CORONARY STENT INTERVENTION;  Surgeon: Wellington Hampshire, MD;  Location: [facility];  Service: Cardiovascular;  Laterality: N/A;  . DG AV DIALYSIS  SHUNT ACCESS EXIST*L* OR     left arm  . EPIGASTRIC HERNIA REPAIR N/A 02/19/2017  Procedure: HERNIA REPAIR EPIGASTRIC ADULT;  Surgeon: Robert Bellow, MD;  Location: [facility] ORS;  Service: General;  Laterality: N/A;  . EYE SURGERY     bilateral cataract  . HERNIA REPAIR     UMBILICAL HERNIA  . HERNIA REPAIR  02/19/2017  . INNER EAR SURGERY     20 years ago  . KIDNEY TRANSPLANT    . LEFT HEART CATH AND CORS/GRAFTS ANGIOGRAPHY N/A 07/15/2019   Procedure: LEFT HEART CATH AND CORS/GRAFTS ANGIOGRAPHY;  Surgeon: Wellington Hampshire, MD;  Location: [facility];  Service: Cardiovascular;  Laterality: N/A;  . LOWER EXTREMITY ANGIOGRAPHY Right 08/25/2019   Procedure: LOWER EXTREMITY ANGIOGRAPHY;  Surgeon: Algernon Huxley, MD;  Location: [facility];  Service: Cardiovascular;  Laterality: Right;  . Peritoneal Dialysis access placement  02/19/2017  . REMOVAL OF A DIALYSIS CATHETER N/A 11/23/2017   Procedure: REMOVAL OF A PERITONEAL DIALYSIS CATHETER;  Surgeon: Algernon Huxley, MD;  Location: [facility] OR[facility];  Service: Vascular;  Laterality: N/A;  . RIGHT/LEFT HEART CATH AND CORONARY ANGIOGRAPHY Bilateral 06/07/2018   Procedure: RIGHT/LEFT HEART CATH AND CORONARY ANGIOGRAPHY;  Surgeon: Wellington Hampshire, MD;  Location: [facility];  Service: Cardiovascular;  Laterality: Bilateral;  . TEE WITHOUT CARDIOVERSION N/A 07/21/2016   Procedure: TRANSESOPHAGEAL ECHOCARDIOGRAM (TEE);  Surgeon: Wellington Hampshire, MD;  Location: [facility];  Service: Cardiovascular;  Laterality: N/A;  .  TEE WITHOUT CARDIOVERSION N/A 09/16/2018   Procedure: TRANSESOPHAGEAL ECHOCARDIOGRAM (TEE);  Surgeon: Melrose Nakayama, MD;  Location: [facility];  Service: Open Heart Surgery;  Laterality: N/A;     Social History   Tobacco Use  . Smoking status: Never Smoker  . Smokeless tobacco: Never Used  Substance Use Topics  . Alcohol use: No  . Drug use: No      Family History  Problem Relation Age of Onset  . Cancer Mother        pancreatic  . Heart disease Father   . Diabetes Father     Allergies  Allergen Reactions  . Statins     Other reaction(s): Arthralgias (intolerance), Myalgias (intolerance)  Pt taking pravastatin   . Sertraline Rash    REVIEW OF SYSTEMS (Negative unless checked)  Constitutional: [] ???Weight loss[] ???Fever[] ???Chills Cardiac:[] ???Chest pain[] ???Atrial Fibrillation[] ???Palpitations [] ???Shortness of breath when laying flat [] ???Shortness of breath with exertion. [] ???Shortness of breath at rest Vascular: [] ???Pain in legs with walking[] ???Pain in legswith standing[] ???Pain in legs when laying flat [] ???Claudication [] ???Pain in feet when laying flat [] ???History of DVT [] ???Phlebitis [] ???Swelling in legs [] ???Varicose veins [x] ???Non-healing ulcers Pulmonary: [] ???Uses home oxygen [] ???Productive cough[] ???Hemoptysis [] ???Wheeze [] ???COPD [] ???Asthma Neurologic: [] ???Dizziness[] ???Seizures [] ???Blackouts[] ???History of stroke [] ???History of TIA[] ???Aphasia [] ???Temporary Blindness[] ???Weaknessor numbness in arm [] ???Weakness or numbnessin leg Musculoskeletal:[] ???Joint swelling [] ???Joint pain [] ???Low back pain [] ???History of Knee Replacement [x] ???Arthritis [] ???back Surgeries[] ???Spinal Stenosis  Hematologic:[] ???Easy bruising[] ???Easy bleeding [] ???Hypercoagulable state [x] ???Anemic Gastrointestinal:[] ???Diarrhea [] ???Vomiting[] ???Gastroesophageal  reflux/heartburn[] ???Difficulty swallowing. [] ???Abdominal pain Genitourinary: [x] ???Chronic kidney disease [] ???Difficulturination [] ???Anuric[] ???Blood in urine [] ???Frequenturination [] ???Burning with urination[] ???Hematuria Skin: [] ???Rashes [x] ???Ulcers [] ???Wounds Psychological: [] ???History of anxiety[x] ???History of major depression [] ???Memory Difficulties  Physical Examination  BP (!) 98/56 (BP Location: Right Arm)   Pulse (!) 106   Resp 15   Wt 188 lb 3.2 oz (85.4 kg)   BMI 27.00 kg/m  Gen:  WD/WN, NAD Head: [place]/AT, No temporalis wasting. Ear/Nose/Throat: Hearing grossly intact, nares w/o erythema or drainage Eyes: Conjunctiva clear. Sclera non-icteric Neck: Supple.  Trachea midline Pulmonary:  Good air movement, no use of accessory muscles.  Cardiac: RRR, no JVD Vascular: Aneurysmal left radiocephalic  AV fistula with good thrill Vessel Right Left  Radial Palpable Palpable                          PT  2+ palpable  1+ palpable  DP  1+ palpable  1+ palpable   Gastrointestinal: soft, non-tender/non-distended. No guarding/reflex.  Musculoskeletal: M/S 5/5 throughout.  No deformity or atrophy.  Small wound on the right lateral foot which is clean without erythema or drainage.  Trace lower extremity edema. Neurologic: Sensation grossly intact in extremities.  Symmetrical.  Speech is fluent.  Psychiatric: Judgment intact, Mood & affect appropriate for pt's clinical situation. Dermatologic: No rashes or ulcers noted.  No cellulitis or open wounds.       Labs Recent Results (from the past 2160 hour(s))  POCT INR     Status: None   Collection Time: 06/15/19 12:00 AM  Result Value Ref Range   INR 2.4 2.0 - 3.0    Comment: mdINR results faxed over  Hepatic function panel     Status: Abnormal   Collection Time: 06/21/19  9:32 AM  Result Value Ref Range   Total Protein 7.6 6.5 - 8.1 g/dL   Albumin 3.9 3.5 - 5.0 g/dL   AST 23 15 - 41 U/L    ALT 19 0 - 44 U/L   Alkaline Phosphatase 152 (H) 38 - 126 U/L   Total Bilirubin 0.7 0.3 - 1.2 mg/dL   Bilirubin, Direct <0.1 0.0 - 0.2 mg/dL   Indirect Bilirubin NOT CALCULATED 0.3 - 0.9 mg/dL    Comment: Performed at [facility], [address] [place], [place] [address]  Lipid panel     Status: Abnormal   Collection Time: 06/21/19  9:32 AM  Result Value Ref Range   Cholesterol 136 0 - 200 mg/dL   Triglycerides 150 (H) <150 mg/dL   HDL 33 (L) >40 mg/dL   Total CHOL/HDL Ratio 4.1 RATIO   VLDL 30 0 - 40 mg/dL   LDL Cholesterol 73 0 - 99 mg/dL    Comment:        Total Cholesterol/HDL:CHD Risk Coronary Heart Disease Risk Table                     Men   Women  1/2 Average Risk   3.4   3.3  Average Risk       5.0   4.4  2 X Average Risk   9.6   7.1  3 X Average Risk  23.4   11.0        Use the calculated Patient Ratio above and the CHD Risk Table to determine the patient's CHD Risk.        ATP III CLASSIFICATION (LDL):  <100     mg/dL   Optimal  100-129  mg/dL   Near or Above                    Optimal  130-159  mg/dL   Borderline  160-189  mg/dL   High  >190     mg/dL   Very High Performed at [facility], [address] [place], [place] [address]   POCT INR     Status: None   Collection Time: 06/22/19 12:00 AM  Result Value Ref Range   INR 3.0 2.0 - 3.0    Comment: results faxed from [facility]  Lab report - scanned     Status: None  Collection Time: 06/22/19 12:00 AM  Result Value Ref Range   INR 3.0   Lab report - scanned     Status: None   Collection Time: 06/22/19 12:00 AM  Result Value Ref Range   INR 3.0   POCT INR     Status: None   Collection Time: 06/29/19 12:00 AM  Result Value Ref Range   INR 2.5 2.0 - 3.0    Comment: self-tester  Lab report - scanned     Status: None   Collection Time: 07/04/19 12:00 AM  Result Value Ref Range   INR 2.2   POCT INR     Status: None   Collection Time: 07/04/19  1:18 PM  Result Value Ref Range    INR 2.2 2.0 - 3.0    Comment: results faxed from [facility] / EF     Status: None   Collection Time: 07/07/19 10:29 AM  Result Value Ref Range   Rest HR 90 bpm   Rest BP 169/111 mmHg   Peak HR 110 bpm   Peak BP 169/77 mmHg   SSS 25    SRS 22    SDS 11    TID 0.39    LV sys vol 117 mL   LV dias vol 179 62 - 150 mL  POCT INR     Status: None   Collection Time: 07/11/19 12:00 AM  Result Value Ref Range   INR 2.1 2.0 - 3.0    Comment: self tester  SARS CORONAVIRUS 2 (TAT 6-24 HRS) Nasopharyngeal Nasopharyngeal Swab     Status: None   Collection Time: 07/12/19  8:43 AM   Specimen: Nasopharyngeal Swab  Result Value Ref Range   SARS Coronavirus 2 NEGATIVE NEGATIVE    Comment: (NOTE) SARS-CoV-2 target nucleic acids are NOT DETECTED. The SARS-CoV-2 RNA is generally detectable in upper and lower respiratory specimens during the acute phase of infection. Negative results do not preclude SARS-CoV-2 infection, do not rule out co-infections with other pathogens, and should not be used as the sole basis for treatment or other patient management decisions. Negative results must be combined with clinical observations, patient history, and epidemiological information. The expected result is Negative. Fact Sheet for Patients: SugarRoll.be Fact Sheet for Healthcare Providers: https://www.woods-mathews.com/ This test is not yet approved or cleared by the [place] FDA and  has been authorized for detection and/or diagnosis of SARS-CoV-2 by FDA under an Emergency Use Authorization (EUA). This EUA will remain  in effect (meaning this test can be used) for the duration of the COVID-19 declaration under Section 56 4(b)(1) of the Act, 21 U.S.C. section 360bbb-3(b)(1), unless the authorization is terminated or revoked sooner. Performed at [facility], [place] [address], [place], [place] [address]   CBC WITH  DIFFERENTIAL     Status: Abnormal   Collection Time: 07/13/19  6:49 PM  Result Value Ref Range   WBC 5.6 4.0 - 10.5 K/uL   RBC 3.32 (L) 4.22 - 5.81 MIL/uL   Hemoglobin 11.1 (L) 13.0 - 17.0 g/dL   HCT 36.2 (L) 39.0 - 52.0 %   MCV 109.0 (H) 80.0 - 100.0 fL   MCH 33.4 26.0 - 34.0 pg   MCHC 30.7 30.0 - 36.0 g/dL   RDW 13.4 11.5 - 15.5 %   Platelets 128 (L) 150 - 400 K/uL    Comment: REPEATED TO VERIFY   nRBC 0.0 0.0 - 0.2 %   Neutrophils Relative %  66 %   Neutro Abs 3.7 1.7 - 7.7 K/uL   Lymphocytes Relative 23 %   Lymphs Abs 1.3 0.7 - 4.0 K/uL   Monocytes Relative 9 %   Monocytes Absolute 0.5 0.1 - 1.0 K/uL   Eosinophils Relative 2 %   Eosinophils Absolute 0.1 0.0 - 0.5 K/uL   Basophils Relative 0 %   Basophils Absolute 0.0 0.0 - 0.1 K/uL   Immature Granulocytes 0 %   Abs Immature Granulocytes 0.02 0.00 - 0.07 K/uL    Comment: Performed at [facility] [address], [place], [place] [address]  Protime-INR     Status: Abnormal   Collection Time: 07/13/19  6:49 PM  Result Value Ref Range   Prothrombin Time 20.7 (H) 11.4 - 15.2 seconds   INR 1.8 (H) 0.8 - 1.2    Comment: (NOTE) INR goal varies based on device and disease states. Performed at [facility], [place] [address], [place], [place] [address]   Basic metabolic panel     Status: Abnormal   Collection Time: 07/14/19  2:22 AM  Result Value Ref Range   Sodium 139 135 - 145 mmol/L   Potassium 4.7 3.5 - 5.1 mmol/L   Chloride 95 (L) 98 - 111 mmol/L   CO2 30 22 - 32 mmol/L   Glucose, Bld 148 (H) 70 - 99 mg/dL    Comment: Glucose reference range applies only to samples taken after fasting for at least 8 hours.   BUN 26 (H) 8 - 23 mg/dL   Creatinine, Ser 5.80 (H) 0.61 - 1.24 mg/dL   Calcium 8.7 (L) 8.9 - 10.3 mg/dL   GFR calc non Af Amer 10 (L) >60 mL/min   GFR calc Af Amer 11 (L) >60 mL/min   Anion gap 14 5 - 15    Comment: Performed at [facility] [address], [place], [place] [address]    Protime-INR     Status: Abnormal   Collection Time: 07/14/19  2:22 AM  Result Value Ref Range   Prothrombin Time 21.2 (H) 11.4 - 15.2 seconds   INR 1.8 (H) 0.8 - 1.2    Comment: (NOTE) INR goal varies based on device and disease states. Performed at [facility], [place] [address] [place], [name] [address]   Heparin level (unfractionated)     Status: Abnormal   Collection Time: 07/14/19  2:22 AM  Result Value Ref Range   Heparin Unfractionated 0.13 (L) 0.30 - 0.70 IU/mL    Comment: (NOTE) If heparin results are below expected values, and patient dosage has  been confirmed, suggest follow up testing of antithrombin III levels. Performed at [facility], [place] [address], [place], [place] [address]   CBC     Status: Abnormal   Collection Time: 07/14/19  2:22 AM  Result Value Ref Range   WBC 5.6 4.0 - 10.5 K/uL   RBC 3.00 (L) 4.22 - 5.81 MIL/uL   Hemoglobin 10.1 (L) 13.0 - 17.0 g/dL   HCT 32.3 (L) 39.0 - 52.0 %   MCV 107.7 (H) 80.0 - 100.0 fL   MCH 33.7 26.0 - 34.0 pg   MCHC 31.3 30.0 - 36.0 g/dL   RDW 13.4 11.5 - 15.5 %   Platelets 111 (L) 150 - 400 K/uL    Comment: REPEATED TO VERIFY PLATELET COUNT CONFIRMED BY SMEAR SPECIMEN CHECKED FOR CLOTS Immature Platelet Fraction may be clinically indicated, consider ordering this additional test [id number]  nRBC 0.4 (H) 0.0 - 0.2 %    Comment: Performed at [facility], [place] [address], [place], [name] [address]  Heparin level (unfractionated)     Status: Abnormal   Collection Time: 07/14/19  4:47 AM  Result Value Ref Range   Heparin Unfractionated 0.26 (L) 0.30 - 0.70 IU/mL    Comment: (NOTE) If heparin results are below expected values, and patient dosage has  been confirmed, suggest follow up testing of antithrombin III levels. Performed at [facility], [place] [address] [place], [name] [address]   Heparin level (unfractionated)     Status: Abnormal   Collection Time: 07/14/19 10:50 AM  Result  Value Ref Range   Heparin Unfractionated 0.25 (L) 0.30 - 0.70 IU/mL    Comment: (NOTE) If heparin results are below expected values, and patient dosage has  been confirmed, suggest follow up testing of antithrombin III levels. Performed at [facility], [place] [address] [place], [name] [address]   Heparin level (unfractionated)     Status: None   Collection Time: 07/14/19  9:02 PM  Result Value Ref Range   Heparin Unfractionated 0.50 0.30 - 0.70 IU/mL    Comment: (NOTE) If heparin results are below expected values, and patient dosage has  been confirmed, suggest follow up testing of antithrombin III levels. Performed at [facility], [place] [address] [place], [place] [address]   Basic metabolic panel     Status: Abnormal   Collection Time: 07/15/19  3:41 AM  Result Value Ref Range   Sodium 139 135 - 145 mmol/L   Potassium 4.6 3.5 - 5.1 mmol/L   Chloride 94 (L) 98 - 111 mmol/L   CO2 29 22 - 32 mmol/L   Glucose, Bld 98 70 - 99 mg/dL    Comment: Glucose reference range applies only to samples taken after fasting for at least 8 hours.   BUN 43 (H) 8 - 23 mg/dL   Creatinine, Ser 8.39 (H) 0.61 - 1.24 mg/dL   Calcium 9.1 8.9 - 10.3 mg/dL   GFR calc non Af Amer 6 (L) >60 mL/min   GFR calc Af Amer 7 (L) >60 mL/min   Anion gap 16 (H) 5 - 15    Comment: Performed at [facility] [address], [place], [place] [address]  Protime-INR     Status: Abnormal   Collection Time: 07/15/19  3:41 AM  Result Value Ref Range   Prothrombin Time 17.0 (H) 11.4 - 15.2 seconds   INR 1.4 (H) 0.8 - 1.2    Comment: (NOTE) INR goal varies based on device and disease states. Performed at [facility], [place] [address], [place], [name] [address]   CBC     Status: Abnormal   Collection Time: 07/15/19  3:41 AM  Result Value Ref Range   WBC 5.9 4.0 - 10.5 K/uL   RBC 2.92 (L) 4.22 - 5.81 MIL/uL   Hemoglobin 9.9 (L) 13.0 - 17.0 g/dL   HCT 31.5 (L) 39.0 - 52.0 %   MCV 107.9 (H)  80.0 - 100.0 fL   MCH 33.9 26.0 - 34.0 pg   MCHC 31.4 30.0 - 36.0 g/dL   RDW 13.5 11.5 - 15.5 %   Platelets 104 (L) 150 - 400 K/uL    Comment: CONSISTENT WITH PREVIOUS RESULT Immature Platelet Fraction may be clinically indicated, consider ordering this additional test [id number] REPEATED TO VERIFY    nRBC 0.0 0.0 -  0.2 %    Comment: Performed at [facility], [place] [address] [place], [name] [address]  Heparin level (unfractionated)     Status: None   Collection Time: 07/15/19  3:41 AM  Result Value Ref Range   Heparin Unfractionated 0.55 0.30 - 0.70 IU/mL    Comment: (NOTE) If heparin results are below expected values, and patient dosage has  been confirmed, suggest follow up testing of antithrombin III levels. Performed at [facility], [place] [address], [place], [place] [address]   POCT Activated clotting time     Status: None   Collection Time: 07/15/19  8:53 AM  Result Value Ref Range   Activated Clotting Time 230 seconds  Basic metabolic panel     Status: Abnormal   Collection Time: 07/16/19  3:58 AM  Result Value Ref Range   Sodium 135 135 - 145 mmol/L   Potassium 6.2 (H) 3.5 - 5.1 mmol/L    Comment: DELTA CHECK NOTED NO VISIBLE HEMOLYSIS    Chloride 90 (L) 98 - 111 mmol/L   CO2 27 22 - 32 mmol/L   Glucose, Bld 90 70 - 99 mg/dL    Comment: Glucose reference range applies only to samples taken after fasting for at least 8 hours.   BUN 58 (H) 8 - 23 mg/dL   Creatinine, Ser 10.51 (H) 0.61 - 1.24 mg/dL   Calcium 9.1 8.9 - 10.3 mg/dL   GFR calc non Af Amer 5 (L) >60 mL/min   GFR calc Af Amer 5 (L) >60 mL/min   Anion gap 18 (H) 5 - 15    Comment: Performed at [facility] [address], [place], [place] [address]  Protime-INR     Status: Abnormal   Collection Time: 07/16/19  3:58 AM  Result Value Ref Range   Prothrombin Time 18.0 (H) 11.4 - 15.2 seconds   INR 1.5 (H) 0.8 - 1.2    Comment: (NOTE) INR goal varies based on device and disease  states. Performed at [facility], [place] [address], [place], [place] [address]   CBC     Status: Abnormal   Collection Time: 07/16/19  3:58 AM  Result Value Ref Range   WBC 7.4 4.0 - 10.5 K/uL   RBC 2.87 (L) 4.22 - 5.81 MIL/uL   Hemoglobin 9.7 (L) 13.0 - 17.0 g/dL   HCT 30.7 (L) 39.0 - 52.0 %   MCV 107.0 (H) 80.0 - 100.0 fL   MCH 33.8 26.0 - 34.0 pg   MCHC 31.6 30.0 - 36.0 g/dL   RDW 13.4 11.5 - 15.5 %   Platelets 108 (L) 150 - 400 K/uL    Comment: CONSISTENT WITH PREVIOUS RESULT Immature Platelet Fraction may be clinically indicated, consider ordering this additional test [id number]    nRBC 0.0 0.0 - 0.2 %    Comment: Performed at [facility], [place] [address] [place], [name] [address]  Heparin level (unfractionated)     Status: Abnormal   Collection Time: 07/16/19  3:16 PM  Result Value Ref Range   Heparin Unfractionated 0.23 (L) 0.30 - 0.70 IU/mL    Comment: (NOTE) If heparin results are below expected values, and patient dosage has  been confirmed, suggest follow up testing of antithrombin III levels. Performed at [facility], [place] [address], [place],  [address]   Basic metabolic panel     Status: Abnormal   Collection Time: 07/17/19  2:59 AM  Result  Value Ref Range   Sodium 136 135 - 145 mmol/L   Potassium 4.5 3.5 - 5.1 mmol/L   Chloride 95 (L) 98 - 111 mmol/L   CO2 28 22 - 32 mmol/L   Glucose, Bld 117 (H) 70 - 99 mg/dL    Comment: Glucose reference range applies only to samples taken after fasting for at least 8 hours.   BUN 34 (H) 8 - 23 mg/dL   Creatinine, Ser 7.80 (H) 0.61 - 1.24 mg/dL   Calcium 9.1 8.9 - 10.3 mg/dL   GFR calc non Af Amer 7 (L) >60 mL/min   GFR calc Af Amer 8 (L) >60 mL/min   Anion gap 13 5 - 15    Comment: Performed at [facility] [address] [place], [place] [address]  Protime-INR     Status: Abnormal   Collection Time: 07/17/19  2:59 AM  Result Value Ref Range   Prothrombin Time 21.7 (H) 11.4 - 15.2  seconds   INR 1.9 (H) 0.8 - 1.2    Comment: (NOTE) INR goal varies based on device and disease states. Performed at [facility], [place] [address], [place], [name] [address]   CBC     Status: Abnormal   Collection Time: 07/17/19  2:59 AM  Result Value Ref Range   WBC 6.1 4.0 - 10.5 K/uL   RBC 2.91 (L) 4.22 - 5.81 MIL/uL   Hemoglobin 9.9 (L) 13.0 - 17.0 g/dL   HCT 31.2 (L) 39.0 - 52.0 %   MCV 107.2 (H) 80.0 - 100.0 fL   MCH 34.0 26.0 - 34.0 pg   MCHC 31.7 30.0 - 36.0 g/dL   RDW 13.7 11.5 - 15.5 %   Platelets 101 (L) 150 - 400 K/uL    Comment: SPECIMEN CHECKED FOR CLOTS CONSISTENT WITH PREVIOUS RESULT Immature Platelet Fraction may be clinically indicated, consider ordering this additional test [id number] REPEATED TO VERIFY    nRBC 0.0 0.0 - 0.2 %    Comment: Performed at [facility], [place] [address], [place], [name] [address]  Heparin level (unfractionated)     Status: None   Collection Time: 07/17/19  2:59 AM  Result Value Ref Range   Heparin Unfractionated 0.41 0.30 - 0.70 IU/mL    Comment: (NOTE) If heparin results are below expected values, and patient dosage has  been confirmed, suggest follow up testing of antithrombin III levels. Performed at [facility], [place] [address] [place], [place] [address]   Basic metabolic panel     Status: Abnormal   Collection Time: 07/18/19  3:49 AM  Result Value Ref Range   Sodium 136 135 - 145 mmol/L   Potassium 4.8 3.5 - 5.1 mmol/L   Chloride 93 (L) 98 - 111 mmol/L   CO2 28 22 - 32 mmol/L   Glucose, Bld 108 (H) 70 - 99 mg/dL    Comment: Glucose reference range applies only to samples taken after fasting for at least 8 hours.   BUN 51 (H) 8 - 23 mg/dL   Creatinine, Ser 10.71 (H) 0.61 - 1.24 mg/dL   Calcium 8.9 8.9 - 10.3 mg/dL   GFR calc non Af Amer 5 (L) >60 mL/min   GFR calc Af Amer 5 (L) >60 mL/min   Anion gap 15 5 - 15    Comment: Performed at [facility] [address], [place], [place] [address]   Protime-INR     Status: Abnormal  Collection Time: 07/18/19  3:49 AM  Result Value Ref Range   Prothrombin Time 23.1 (H) 11.4 - 15.2 seconds   INR 2.1 (H) 0.8 - 1.2    Comment: (NOTE) INR goal varies based on device and disease states. Performed at [facility], [place] [address], [place], [name] [address]   Heparin level (unfractionated)     Status: None   Collection Time: 07/18/19  3:49 AM  Result Value Ref Range   Heparin Unfractionated 0.39 0.30 - 0.70 IU/mL    Comment: (NOTE) If heparin results are below expected values, and patient dosage has  been confirmed, suggest follow up testing of antithrombin III levels. Performed at [facility], [place] [address] [place], [place] [address]   POCT INR     Status: None   Collection Time: 07/20/19 12:00 AM  Result Value Ref Range   INR 2.1 2.0 - 3.0    Comment: results faxed from [facility]  Lab report - scanned     Status: None   Collection Time: 07/20/19 12:00 AM  Result Value Ref Range   INR 2.1   POCT INR     Status: None   Collection Time: 07/21/19 12:00 AM  Result Value Ref Range   INR 2.5 2.0 - 3.0    Comment: self tester  POCT INR     Status: None   Collection Time: 07/25/19 12:00 AM  Result Value Ref Range   INR 2.7 2.0 - 3.0    Comment: results faxed from [facility]  POCT INR     Status: Abnormal   Collection Time: 08/01/19 12:00 AM  Result Value Ref Range   INR 1.8 (A) 2.0 - 3.0    Comment: pt called w/ INR results - home tester  Lab report - scanned     Status: None   Collection Time: 08/08/19 12:00 AM  Result Value Ref Range   INR 2.4   POCT INR     Status: None   Collection Time: 08/08/19 10:59 AM  Result Value Ref Range   INR 2.4 2.0 - 3.0  POCT INR     Status: None   Collection Time: 08/15/19 12:00 AM  Result Value Ref Range   INR 2.6 2.0 - 3.0    Comment: results faxed from [facility]  POCT INR     Status: None   Collection Time: 08/22/19 12:00 AM  Result Value Ref Range   INR 2.6 2.0 - 3.0     Comment: self tester  SARS CORONAVIRUS 2 (TAT 6-24 HRS) Nasopharyngeal Nasopharyngeal Swab     Status: None   Collection Time: 08/23/19 11:47 AM   Specimen: Nasopharyngeal Swab  Result Value Ref Range   SARS Coronavirus 2 NEGATIVE NEGATIVE    Comment: (NOTE) SARS-CoV-2 target nucleic acids are NOT DETECTED. The SARS-CoV-2 RNA is generally detectable in upper and lower respiratory specimens during the acute phase of infection. Negative results do not preclude SARS-CoV-2 infection, do not rule out co-infections with other pathogens, and should not be used as the sole basis for treatment or other patient management decisions. Negative results must be combined with clinical observations, patient history, and epidemiological information. The expected result is Negative. Fact Sheet for Patients: SugarRoll.be Fact Sheet for Healthcare Providers: https://www.woods-mathews.com/ This test is not yet approved or cleared by the [place] FDA and  has been authorized for detection and/or diagnosis of SARS-CoV-2 by FDA under an Emergency Use Authorization (EUA). This EUA will remain  in  effect (meaning this test can be used) for the duration of the COVID-19 declaration under Section 56 4(b)(1) of the Act, 21 U.S.C. section 360bbb-3(b)(1), unless the authorization is terminated or revoked sooner. Performed at [facility], [place] [address] [place], [place] [address]   Protime-INR     Status: Abnormal   Collection Time: 08/25/19 10:04 AM  Result Value Ref Range   Prothrombin Time 19.3 (H) 11.4 - 15.2 seconds   INR 1.7 (H) 0.8 - 1.2    Comment: (NOTE) INR goal varies based on device and disease states. Performed at [facility], [place] [place], [place] [address]   POCT INR     Status: Abnormal   Collection Time: 08/29/19 12:00 AM  Result Value Ref Range   INR 1.8 (A) 2.0 - 3.0    Comment: pt calling w/ results from home  [facility] monitor  Lab report - scanned     Status: None   Collection Time: 08/29/19 12:00 AM  Result Value Ref Range   INR 1.8     Radiology PERIPHERAL VASCULAR CATHETERIZATION  Result Date: 08/25/2019 See op note   Assessment/Plan  Hyperlipidemia lipid control important in reducing the progression of atherosclerotic disease. Continue statin therapy   ESRD on dialysis [facility]) Certainly slows his wound healing. 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  CAD (coronary artery disease) The patient has history of coronary bypass and now is having more cardiac issues.   Recent cardiac cath.  Had intervention at that time.  He does feel better after intervention  Essential hypertension blood pressure control important in reducing the progression of atherosclerotic disease. On appropriate oral medications.  Atherosclerosis of native arteries of the extremities with ulceration (HCC) ABIs remain noncompressible bilaterally.  His previous digital pressure was in the 60s but his digital pressure today is one hundred and thirty-one on the right.  His left digital pressure is eighty-one.  He has pretty good digital waveforms bilaterally.  His postoperative bruising was more than most secondary to continued anticoagulation around the time of the procedure for his heart valve.  It is improving and remains mild to moderate.  He likely has had some nerve irritation as well but this should gradually improve.  He is going to use warm compresses and anti-inflammatories as tolerated.  We will plan to see him back in 3 months with noninvasive studies or sooner if problems develop in the interim.  Continue current medical regimen including full anticoagulation.    Leotis Pain, MD  09/06/2019 2:47 PM    This note was created with Dragon medical transcription system.  Any errors from dictation are purely unintentional

## 2019-09-06 NOTE — Telephone Encounter (Signed)
This encounter was created in error - please disregard.

## 2019-09-07 ENCOUNTER — Telehealth: Payer: Self-pay

## 2019-09-07 ENCOUNTER — Ambulatory Visit (INDEPENDENT_AMBULATORY_CARE_PROVIDER_SITE_OTHER): Payer: Medicare Other

## 2019-09-07 DIAGNOSIS — Z5181 Encounter for therapeutic drug level monitoring: Secondary | ICD-10-CM

## 2019-09-07 DIAGNOSIS — I4821 Permanent atrial fibrillation: Secondary | ICD-10-CM | POA: Diagnosis not present

## 2019-09-07 DIAGNOSIS — Z952 Presence of prosthetic heart valve: Secondary | ICD-10-CM

## 2019-09-07 LAB — POCT INR: INR: 2.9 (ref 2.0–3.0)

## 2019-09-07 NOTE — Telephone Encounter (Signed)
Please see anti-coag note for today. 

## 2019-09-07 NOTE — Telephone Encounter (Signed)
Patient calling in with INR reading of 2.9

## 2019-09-07 NOTE — Patient Instructions (Signed)
Spoke w/ pt and advised him to continue warfar dosage of 1 mg every day. Recheck INR in 1 week.

## 2019-09-08 ENCOUNTER — Other Ambulatory Visit: Payer: Self-pay

## 2019-09-08 ENCOUNTER — Encounter: Payer: Medicare Other | Attending: Cardiovascular Disease | Admitting: *Deleted

## 2019-09-08 DIAGNOSIS — Z955 Presence of coronary angioplasty implant and graft: Secondary | ICD-10-CM | POA: Diagnosis present

## 2019-09-08 DIAGNOSIS — Z951 Presence of aortocoronary bypass graft: Secondary | ICD-10-CM | POA: Insufficient documentation

## 2019-09-08 NOTE — Progress Notes (Signed)
Daily Session Note  Patient Details  Name: Jeremy [name] MRN: [id number] Date of Birth: 1955-06-03 Referring Provider:     Cardiac Rehab from 08/02/2019 in [facility]  Referring Provider  Kathlyn Sacramento MD      Encounter Date: 09/08/2019  Check In: Session Check In - 09/08/19 0943      Check-In   Supervising physician immediately available to respond to emergencies  See telemetry face sheet for immediately available ER MD    Location  [facility]-Cardiac & Pulmonary Rehab    Staff Present  Heath Lark, RN, BSN, CCRP;Amanda Sommer, BA, ACSM CEP, Exercise Physiologist;Melissa Caiola RDN, LDN    Virtual Visit  No    Medication changes reported      No    Fall or balance concerns reported     No    Warm-up and Cool-down  Performed on first and last piece of equipment    Resistance Training Performed  Yes    VAD Patient?  No    PAD/SET Patient?  No      Pain Assessment   Currently in Pain?  No/denies          Social History   Tobacco Use  Smoking Status Never Smoker  Smokeless Tobacco Never Used    Goals Met:  Independence with exercise equipment Exercise tolerated well No report of cardiac concerns or symptoms  Goals Unmet:  Not Applicable  Comments: Pt able to follow exercise prescription today without complaint.  Will continue to monitor for progression.    Dr. Emily Filbert is Medical Director for [name].

## 2019-09-12 ENCOUNTER — Ambulatory Visit (INDEPENDENT_AMBULATORY_CARE_PROVIDER_SITE_OTHER): Payer: Medicare Other

## 2019-09-12 ENCOUNTER — Encounter: Payer: Self-pay | Admitting: Cardiovascular Disease

## 2019-09-12 ENCOUNTER — Other Ambulatory Visit: Payer: Self-pay | Admitting: Cardiovascular Disease

## 2019-09-12 DIAGNOSIS — Z952 Presence of prosthetic heart valve: Secondary | ICD-10-CM

## 2019-09-12 DIAGNOSIS — I4821 Permanent atrial fibrillation: Secondary | ICD-10-CM

## 2019-09-12 LAB — POCT INR: INR: 2.6 (ref 2.0–3.0)

## 2019-09-12 LAB — LAB REPORT - SCANNED: INR: 2.6

## 2019-09-12 NOTE — Patient Instructions (Addendum)
Left message for pt advising him to continue warfarin dosage of 1 mg every day. Recheck INR in 1 week.

## 2019-09-13 ENCOUNTER — Other Ambulatory Visit: Payer: Self-pay

## 2019-09-13 ENCOUNTER — Encounter: Payer: Medicare Other | Admitting: *Deleted

## 2019-09-13 DIAGNOSIS — Z955 Presence of coronary angioplasty implant and graft: Secondary | ICD-10-CM | POA: Diagnosis not present

## 2019-09-13 NOTE — Progress Notes (Signed)
Daily Session Note  Patient Details  Name: Jeremy [name] MRN: [id number] Date of Birth: [birth year] Referring Provider:     Cardiac Rehab from 08/02/2019 in [facility] Cardiac and Pulmonary Rehab  Referring Provider  Kathlyn Sacramento MD      Encounter Date: 09/13/2019  Check In: Session Check In - 09/13/19 0828      Check-In   Supervising physician immediately available to respond to emergencies  See telemetry face sheet for immediately available ER MD    Location  [facility]-Cardiac & Pulmonary Rehab    Staff Present  Hope Budds RDN, LDN;Joseph Hood RCP,RRT,BSRT;Heath Lark, RN, BSN, CCRP    Virtual Visit  No    Medication changes reported      No    Fall or balance concerns reported     No    Warm-up and Cool-down  Performed on first and last piece of equipment    Resistance Training Performed  Yes    VAD Patient?  No    PAD/SET Patient?  No      Pain Assessment   Currently in Pain?  No/denies          Social History   Tobacco Use  Smoking Status Never Smoker  Smokeless Tobacco Never Used    Goals Met:  Independence with exercise equipment Exercise tolerated well No report of cardiac concerns or symptoms  Goals Unmet:  Not Applicable  Comments: Pt able to follow exercise prescription today without complaint.  Will continue to monitor for progression.    Dr. Emily Filbert is Medical Director for [facility] [place] [facility].

## 2019-09-15 ENCOUNTER — Other Ambulatory Visit: Payer: Self-pay

## 2019-09-15 ENCOUNTER — Encounter: Payer: Medicare Other | Admitting: *Deleted

## 2019-09-15 DIAGNOSIS — Z955 Presence of coronary angioplasty implant and graft: Secondary | ICD-10-CM

## 2019-09-15 NOTE — Progress Notes (Signed)
Daily Session Note  Patient Details  Name: EMILLIANO DILWORTH MRN: [id number] Date of Birth: 1955/05/09 Referring Provider:     Cardiac Rehab from 08/02/2019 in [facility] Cardiac and [facility]monary Rehab  Referring Provider Kathlyn Sacramento MD      Encounter Date: 09/15/2019  Check In:  Session Check In - 09/15/19 0946      Check-In   Supervising physician immediately available to respond to emergencies See telemetry face sheet for immediately available ER MD    Location [facility]-Cardiac & Pulmonary Rehab    Staff Present Heath Lark, RN, BSN, CCRP;Melissa [place] RDN, LDN;Jessica [place], MA, RCEP, CCRP, CCET    Virtual Visit No    Medication changes reported     No    Fall or balance concerns reported    No    Warm-up and Cool-down Performed on first and last piece of equipment    Resistance Training Performed Yes    VAD Patient? No    PAD/SET Patient? No      Pain Assessment   Currently in Pain? No/denies              Social History   Tobacco Use  Smoking Status Never Smoker  Smokeless Tobacco Never Used    Goals Met:  Independence with exercise equipment Exercise tolerated well No report of cardiac concerns or symptoms  Goals Unmet:  Not Applicable  Comments: Pt able to follow exercise prescription today without complaint.  Will continue to monitor for progression.    Dr. Emily Filbert is Medical Director for [place] [facility].

## 2019-09-18 ENCOUNTER — Other Ambulatory Visit: Payer: Self-pay | Admitting: Family Medicine

## 2019-09-18 ENCOUNTER — Encounter (INDEPENDENT_AMBULATORY_CARE_PROVIDER_SITE_OTHER): Payer: Self-pay

## 2019-09-19 LAB — LAB REPORT - SCANNED: INR: 3

## 2019-09-19 NOTE — Telephone Encounter (Signed)
Can we get him in with ABIs, Dew or myself, per patient preference

## 2019-09-20 ENCOUNTER — Other Ambulatory Visit (INDEPENDENT_AMBULATORY_CARE_PROVIDER_SITE_OTHER): Payer: Self-pay | Admitting: Nurse Practitioner

## 2019-09-20 ENCOUNTER — Telehealth: Payer: Self-pay

## 2019-09-20 ENCOUNTER — Encounter (INDEPENDENT_AMBULATORY_CARE_PROVIDER_SITE_OTHER): Payer: Self-pay

## 2019-09-20 DIAGNOSIS — M79605 Pain in left leg: Secondary | ICD-10-CM

## 2019-09-20 NOTE — Telephone Encounter (Signed)
Jeremy [name] called to say he is still having problems with his leg and will miss class today.  He will call to let us know about Thursday.

## 2019-09-21 ENCOUNTER — Encounter: Payer: Self-pay | Admitting: *Deleted

## 2019-09-21 ENCOUNTER — Ambulatory Visit (INDEPENDENT_AMBULATORY_CARE_PROVIDER_SITE_OTHER): Payer: Medicare Other | Admitting: Nurse Practitioner

## 2019-09-21 ENCOUNTER — Ambulatory Visit (INDEPENDENT_AMBULATORY_CARE_PROVIDER_SITE_OTHER): Payer: Medicare Other

## 2019-09-21 ENCOUNTER — Encounter (INDEPENDENT_AMBULATORY_CARE_PROVIDER_SITE_OTHER): Payer: Self-pay | Admitting: Nurse Practitioner

## 2019-09-21 ENCOUNTER — Other Ambulatory Visit: Payer: Self-pay

## 2019-09-21 VITALS — BP 139/74 | HR 93 | Ht 70.0 in | Wt 186.0 lb

## 2019-09-21 DIAGNOSIS — M79605 Pain in left leg: Secondary | ICD-10-CM

## 2019-09-21 DIAGNOSIS — T829XXS Unspecified complication of cardiac and vascular prosthetic device, implant and graft, sequela: Secondary | ICD-10-CM | POA: Diagnosis not present

## 2019-09-21 DIAGNOSIS — I10 Essential (primary) hypertension: Secondary | ICD-10-CM

## 2019-09-21 DIAGNOSIS — Z955 Presence of coronary angioplasty implant and graft: Secondary | ICD-10-CM

## 2019-09-21 DIAGNOSIS — I7025 Atherosclerosis of native arteries of other extremities with ulceration: Secondary | ICD-10-CM

## 2019-09-21 MED ORDER — OXYCODONE-ACETAMINOPHEN 5-325 MG PO TABS: 1.0000 | ORAL_TABLET | Freq: Four times a day (QID) | ORAL | 0 refills | Status: DC | PRN

## 2019-09-21 NOTE — Progress Notes (Signed)
Cardiac Individual Treatment Plan  Patient Details  Name: Jeremy [name] MRN: [id number] Date of Birth: 1956/01/24 Referring Provider:     Cardiac Rehab from 08/02/2019 in [facility]  Referring Provider Kathlyn Sacramento MD      Initial Encounter Date:    Cardiac Rehab from 08/02/2019 in [facility]  Date 08/02/19      Visit Diagnosis: Status post coronary artery stent placement  Patient's Home Medications on Admission:  Current Outpatient Medications:  .  acetaminophen (TYLENOL) 500 MG tablet, Take 1,000 mg 2 (two) times daily as needed by mouth for moderate pain., Disp: , Rfl:  .  allopurinol (ZYLOPRIM) 300 MG tablet, Take 300 mg by mouth daily., Disp: , Rfl: 5 .  cinacalcet (SENSIPAR) 30 MG tablet, Take 30 mg by mouth every other day. , Disp: , Rfl:  .  clopidogrel (PLAVIX) 75 MG tablet, Take 1 tablet (75 mg total) by mouth daily with breakfast., Disp: 90 tablet, Rfl: 3 .  ezetimibe (ZETIA) 10 MG tablet, Take 1 tablet (10 mg total) by mouth daily., Disp: 90 tablet, Rfl: 3 .  FLUoxetine (PROZAC) 20 MG tablet, TAKE 1 TABLET BY MOUTH EVERY DAY, Disp: 90 tablet, Rfl: 2 .  hydroxychloroquine (PLAQUENIL) 200 MG tablet, Take 200 mg by mouth 2 (two) times daily., Disp: , Rfl:  .  lidocaine-prilocaine (EMLA) cream, Apply 1 application topically as needed (for fistula)., Disp: , Rfl:  .  loratadine (CLARITIN) 10 MG tablet, Take 10 mg by mouth daily., Disp: , Rfl:  .  metoprolol tartrate (LOPRESSOR) 25 MG tablet, Take 1 tablet (25 mg total) by mouth in the morning, at noon, and at bedtime., Disp: 270 tablet, Rfl: 3 .  multivitamin (RENA-VIT) TABS tablet, Take 1 tablet by mouth daily., Disp: , Rfl:  .  nitroGLYCERIN (NITROSTAT) 0.4 MG SL tablet, Place 1 tablet (0.4 mg total) under the tongue every 5 (five) minutes as needed for chest pain., Disp: 25 tablet, Rfl: 3 .  pantoprazole (PROTONIX) 40 MG tablet, Take 1 tablet (40 mg total) by mouth daily., Disp: 90  tablet, Rfl: 3 .  predniSONE (DELTASONE) 5 MG tablet, Take 5 mg by mouth daily with breakfast. , Disp: , Rfl:  .  rosuvastatin (CRESTOR) 40 MG tablet, Take 1 tablet (40 mg total) by mouth daily at 6 PM., Disp: 90 tablet, Rfl: 3 .  sevelamer carbonate (RENVELA) 800 MG tablet, Take 4,000 mg by mouth 3 (three) times daily with meals. 5 tabs per meal , Disp: , Rfl:  .  warfarin (COUMADIN) 1 MG tablet, Take 1-1.5 tablets (1-1.5 mg total) by mouth See admin instructions. Take 15m on Sun, Tues, Thur and Sat Take 1.567mon Mon, Wed, Fri, Disp: 180 tablet, Rfl: 1  Past Medical History: Past Medical History:  Diagnosis Date  . Abnormal stress test 03/19/2018  . 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 Aortic stenosis    a. 2014 s/p mech AVR ([facility])-->chronic coumadin; b. 08/2018 Echo: Mild AS/AI. Nl fxn/gradient.  . Arthritis   . Atrial fibrillation ([facility]  . CAD (coronary artery disease)    a. 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 [facility] inflat infarct w/ min rev, EF 30%; c. 06/2018 Cath: LM min irregs, LAD 60p, 95/30/31m, LCX min irregs, RCA 40p, 21m67md, RPAV 90, RPDA 95ost; c. 09/2018 CABGx3 (LIMA->LAD, VG->D1, VG->PDA).  .Marland Kitchen  Chronic anticoagulation 06/2012   a. Coumadin in the setting of mech AVR and afib.  Marland Kitchen COVID-19 03/15/2019  . CPAP (continuous positive airway pressure) dependence   . Dialysis patient [facility]) 04/07/2006   Patient started HD around 1996,  had 1st transplant LLQ around 1998 until 2007 when they found renal cell cancer in transplant and both native kidneys, all were removed > went back on HD until 2nd transplant RLQ in 2014 which lasted 3 yrs; it was embolized for suspected acute rejection. Is currently on HD MWF in [place] w/ [facility] nephrology.  . ESRD (end stage renal disease) ([place])    a. CKD 2/2 to IgA nephropathy (dx age 24); b. 1999 s/p Kidney transplant; c. 2014 Bilat nephrectomy  (transplanted kidney resected) in the setting of renal cell carcinoma; d. PD until 11/2017-->HD.  Marland Kitchen Heart murmur   . HFrEF (heart failure with reduced ejection fraction) ([place])    a. 07/2016 Echo: EF 55-60%; b. 02/2018 MV: EF 30%; c. 04/2018 Echo: EF 35%; d. 08/2018 Echo: EF 30-35%. Glob HK w/o rwma. Mildly reduced RV fxn. Mod to sev dil LA. Nl AoV fxn.  Marland Kitchen Hx of colonoscopy    2009 by Dr. Laural Golden. Normal except for small submucosal lipoma. No evidence of polyps or colitis.  . Hyperlipidemia   . Hypertension   . Hypogonadism in male 07/25/2015  . Ischemic cardiomyopathy    a. 07/2016 Echo: EF 55-60%; b. 02/2018 MV: EF 30%; c. 04/2018 Echo: EF 35%; d. 08/2018 Echo: EF 30-35%.  . OSA (obstructive sleep apnea)    No CPAP  . Permanent atrial fibrillation (HCC)    a. CHA2DS2VASc = 3-->Coumadin (also mech AVR); b. 06/2018 Amio started 2/2 elevated rates.  . Renal cell cancer ([place])   . Renal cell carcinoma ([place]) 08/04/2016    Tobacco Use: Social History   Tobacco Use  Smoking Status Never Smoker  Smokeless Tobacco Never Used    Labs: Recent Review Flowsheet Data    Labs for ITP Cardiac and Pulmonary Rehab Latest Ref Rng & Units 09/16/2018 09/16/2018 03/15/2019 04/26/2019 06/21/2019   Cholestrol 0 - 200 mg/dL - - - 179 136   LDLCALC 0 - 99 mg/dL - - - 111(H) 73   LDLDIRECT mg/dL - - - - -   HDL >40 mg/dL - - - 38(L) 33(L)   Trlycerides <150 mg/dL - - 179(H) 167(H) 150(H)   Hemoglobin A1c 4.8 - 5.6 % - - - - -   PHART 7.35 - 7.45 7.329(L) - - - -   PCO2ART 32 - 48 mmHg 48.1(H) - - - -   HCO3 20.0 - 28.0 mmol/L 25.2 - - - -   TCO2 22 - 32 mmol/L 27 24 - - -   ACIDBASEDEF 0.0 - 2.0 mmol/L 1.0 - - - -   O2SAT % 99.0 - - - -       Exercise Target Goals: Exercise Program Goal: Individual exercise prescription set using results from initial 6 min walk test and THRR while considering  patient's activity barriers and safety.   Exercise Prescription Goal: Initial exercise prescription builds to 30-45  minutes a day of aerobic activity, 2-3 days per week.  Home exercise guidelines will be given to patient during program as part of exercise prescription that the participant will acknowledge.   Education: Aerobic Exercise & Resistance Training: - Gives group verbal and written instruction on the various components of exercise. Focuses on aerobic and resistive training programs and the benefits  of this training and how to safely progress through these programs..   Education: Exercise & Equipment Safety: - Individual verbal instruction and demonstration of equipment use and safety with use of the equipment.   Cardiac Rehab from 07/28/2019 in [facility]  Date 07/28/19  Educator [facility]  Instruction Review Code 1- Verbalizes Understanding      Education: Exercise Physiology & General Exercise Guidelines: - Group verbal and written instruction with models to review the exercise physiology of the cardiovascular system and associated critical values. Provides general exercise guidelines with specific guidelines to those with heart or lung disease.    Education: Flexibility, Balance, Mind/Body Relaxation: Provides group verbal/written instruction on the benefits of flexibility and balance training, including mind/body exercise modes such as yoga, pilates and tai chi.  Demonstration and skill practice provided.   Cardiac Rehab from 02/24/2019 in [facility] Cardiac and Pul[facility] Rehab  Date 02/24/19  Educator as  Instruction Review Code 1- Verbalizes Understanding      Activity Barriers & Risk Stratification:  Activity Barriers & Cardiac Risk Stratification - 08/02/19 0956      Activity Barriers & Cardiac Risk Stratification   Activity Barriers Joint Problems;Muscular Weakness;Deconditioning;Shortness of Breath;Balance Concerns    Cardiac Risk Stratification High           6 Minute Walk:  6 Minute Walk    Row Name 08/02/19 0943         6 Minute Walk   Phase Initial      Distance 1265 feet     Walk Time 6 minutes     # of Rest Breaks 0     MPH 2.4     METS 3.68     RPE 17     VO2 Peak 12.85     Symptoms Yes (comment)     Comments joints achy     Resting HR 88 bpm     Resting BP 128/64     Resting Oxygen Saturation  99 %     Exercise Oxygen Saturation  during 6 min walk 100 %     Max Ex. HR 119 bpm     Max Ex. BP 148/60     2 Minute Post BP 122/64            Oxygen Initial Assessment:   Oxygen Re-Evaluation:   Oxygen Discharge (Final Oxygen Re-Evaluation):   Initial Exercise Prescription:  Initial Exercise Prescription - 08/02/19 0900      Date of Initial Exercise RX and Referring Provider   Date 08/02/19    Referring Provider Kathlyn Sacramento MD      Treadmill   MPH 2.3    Grade 1    Minutes 15    METs 3.08      NuStep   Level 3    SPM 80    Minutes 15    METs 3      REL-XR   Level 3    Speed 50    Minutes 15    METs 3      T5 Nustep   Level 3    SPM 80    Minutes 15    METs 3      Biostep-RELP   Level 3    SPM 50    Minutes 15    METs 3      Prescription Details   Frequency (times per week) 2    Duration Progress to 30 minutes of continuous aerobic without signs/symptoms of  physical distress      Intensity   THRR 40-80% of Max Heartrate 116-144    Ratings of Perceived Exertion 11-13    Perceived Dyspnea 0-4      Progression   Progression Continue to progress workloads to maintain intensity without signs/symptoms of physical distress.      Resistance Training   Training Prescription Yes    Weight 3 lb    Reps 10-15           Perform Capillary Blood Glucose checks as needed.  Exercise Prescription Changes:  Exercise Prescription Changes    Row Name 08/02/19 0900 08/22/19 1300 09/06/19 1500         Response to Exercise   Blood Pressure (Admit) 128/64 118/60 112/64     Blood Pressure (Exercise) 148/60 130/62 120/60     Blood Pressure (Exit) 122/64 124/64 --     Heart Rate (Admit) 88 bpm  122 bpm 126 bpm     Heart Rate (Exercise) 119 bpm 123 bpm 126 bpm     Heart Rate (Exit) 95 bpm 122 bpm 126 bpm     Oxygen Saturation (Admit) 99 % -- --     Oxygen Saturation (Exercise) 100 % -- --     Rating of Perceived Exertion (Exercise) '17 14 13     ' Symptoms joints achy none none     Comments walk test results -- --     Duration -- Continue with 30 min of aerobic exercise without signs/symptoms of physical distress. Continue with 30 min of aerobic exercise without signs/symptoms of physical distress.     Intensity -- THRR unchanged THRR unchanged       Progression   Progression -- Continue to progress workloads to maintain intensity without signs/symptoms of physical distress. Continue to progress workloads to maintain intensity without signs/symptoms of physical distress.     Average METs -- 3.59 3.5       Resistance Training   Training Prescription -- Yes Yes     Weight -- 3 lb 3 lb     Reps -- 10-15 10-15       Interval Training   Interval Training -- No No       Treadmill   MPH -- 2.3 2.5     Grade -- 1 1.5     Minutes -- 15 15     METs -- 3.08 3.43       REL-XR   Level -- 3 7     Speed -- -- 50     Minutes -- 15 15     METs -- 4.1 3.6            Exercise Comments:   Exercise Goals and Review:  Exercise Goals    Row Name 08/02/19 1005             Exercise Goals   Increase Physical Activity Yes       Intervention Provide advice, education, support and counseling about physical activity/exercise needs.;Develop an individualized exercise prescription for aerobic and resistive training based on initial evaluation findings, risk stratification, comorbidities and participant's personal goals.       Expected Outcomes Short Term: Attend rehab on a regular basis to increase amount of physical activity.;Long Term: Add in home exercise to make exercise part of routine and to increase amount of physical activity.;Long Term: Exercising regularly at least 3-5 days a  week.       Increase Strength and Stamina Yes  Intervention Provide advice, education, support and counseling about physical activity/exercise needs.;Develop an individualized exercise prescription for aerobic and resistive training based on initial evaluation findings, risk stratification, comorbidities and participant's personal goals.       Expected Outcomes Short Term: Increase workloads from initial exercise prescription for resistance, speed, and METs.;Short Term: Perform resistance training exercises routinely during rehab and add in resistance training at home;Long Term: Improve cardiorespiratory fitness, muscular endurance and strength as measured by increased METs and functional capacity (6MWT)       Able to understand and use rate of perceived exertion (RPE) scale Yes       Intervention Provide education and explanation on how to use RPE scale       Expected Outcomes Short Term: Able to use RPE daily in rehab to express subjective intensity level;Long Term:  Able to use RPE to guide intensity level when exercising independently       Able to understand and use Dyspnea scale Yes       Intervention Provide education and explanation on how to use Dyspnea scale       Expected Outcomes Short Term: Able to use Dyspnea scale daily in rehab to express subjective sense of shortness of breath during exertion;Long Term: Able to use Dyspnea scale to guide intensity level when exercising independently       Knowledge and understanding of Target Heart Rate Range (THRR) Yes       Intervention Provide education and explanation of THRR including how the numbers were predicted and where they are located for reference       Expected Outcomes Short Term: Able to state/look up THRR;Short Term: Able to use daily as guideline for intensity in rehab;Long Term: Able to use THRR to govern intensity when exercising independently       Able to check pulse independently Yes       Intervention Provide education and  demonstration on how to check pulse in carotid and radial arteries.;Review the importance of being able to check your own pulse for safety during independent exercise       Expected Outcomes Short Term: Able to explain why pulse checking is important during independent exercise;Long Term: Able to check pulse independently and accurately       Understanding of Exercise Prescription Yes       Intervention Provide education, explanation, and written materials on patient's individual exercise prescription       Expected Outcomes Short Term: Able to explain program exercise prescription;Long Term: Able to explain home exercise prescription to exercise independently              Exercise Goals Re-Evaluation :  Exercise Goals Re-Evaluation    Row Name 08/11/19 1056 08/22/19 1331 09/06/19 1513         Exercise Goal Re-Evaluation   Exercise Goals Review Increase Physical Activity;Able to understand and use rate of perceived exertion (RPE) scale;Knowledge and understanding of Target Heart Rate Range (THRR);Understanding of Exercise Prescription;Increase Strength and Stamina;Able to check pulse independently Increase Physical Activity;Increase Strength and Stamina;Understanding of Exercise Prescription Increase Physical Activity;Increase Strength and Stamina;Able to understand and use rate of perceived exertion (RPE) scale     Comments Reviewed RPE and dyspnea scales, THR and program prescription with pt today.  Pt voiced understanding and was given a copy of goals to take home. Dorrance is off to a good start again in rehab.  He is already up to 4.1 METs on the XR.  We will continue to  monitor his progress. [place] attends consistently and is in correct THR range.Staff will monitor progress.     Expected Outcomes Short: Use RPE daily to regulate intensity. Long: Follow program prescription in THR. Short: Begin to increase workloads  Long: Continue to follow program prescription. Short: attend consistently and add  home exercise Long:  improve overall stamina            Discharge Exercise Prescription (Final Exercise Prescription Changes):  Exercise Prescription Changes - 09/06/19 1500      Response to Exercise   Blood Pressure (Admit) 112/64    Blood Pressure (Exercise) 120/60    Heart Rate (Admit) 126 bpm    Heart Rate (Exercise) 126 bpm    Heart Rate (Exit) 126 bpm    Rating of Perceived Exertion (Exercise) 13    Symptoms none    Duration Continue with 30 min of aerobic exercise without signs/symptoms of physical distress.    Intensity THRR unchanged      Progression   Progression Continue to progress workloads to maintain intensity without signs/symptoms of physical distress.    Average METs 3.5      Resistance Training   Training Prescription Yes    Weight 3 lb    Reps 10-15      Interval Training   Interval Training No      Treadmill   MPH 2.5    Grade 1.5    Minutes 15    METs 3.43      REL-XR   Level 7    Speed 50    Minutes 15    METs 3.6           Nutrition:  Target Goals: Understanding of nutrition guidelines, daily intake of sodium <1557m, cholesterol <2030m calories 30% from fat and 7% or less from saturated fats, daily to have 5 or more servings of fruits and vegetables.  Education: Controlling Sodium/Reading Food Labels -Group verbal and written material supporting the discussion of sodium use in heart healthy nutrition. Review and explanation with models, verbal and written materials for utilization of the food label.   Education: General Nutrition Guidelines/Fats and Fiber: -Group instruction provided by verbal, written material, models and posters to present the general guidelines for heart healthy nutrition. Gives an explanation and review of dietary fats and fiber.   [facility]  Date 02/24/19  Educator mc  Instruction Review Code 1- Verbalizes Understanding      Biometrics:  Pre  Biometrics - 08/02/19 1005      Pre Biometrics   Height 5' 9.9" (1.775 m)    Weight 189 lb (85.7 kg)    BMI (Calculated) 27.21    Single Leg Stand 1.21 seconds            Nutrition Therapy Plan and Nutrition Goals:  Nutrition Therapy & Goals - 08/30/19 0848      Nutrition Therapy   Diet Low Na, heart healthy, renal diet    Drug/Food Interactions Coumadin/Vit K;Statins/Certain Fruits;Purine/Gout;Food/Disease    Protein (specify units) 70g    Fiber 30 grams    Whole Grain Foods 3 servings    Saturated Fats 12 max. grams    Fruits and Vegetables 5 servings/day    Sodium 1.5 grams      Personal Nutrition Goals   Nutrition Goal ST: review flavor handout LT: gain strength and get back to doing what he was doing before, reducing fluid retention.    Comments cut  out red meat and pork. Usually eats baked chicken, more vegetables. Goes out to eat and doesn't like his food and is very bored. Pt will have egs with a small amount of cheese in the morning. Reviewed heart healthy and renal friendly eating. Pt made lots of healthy changes since he was last at rehab, but now he is very unsatisfied with his food, discussed ways to enhance flavor and variety while eating renal friendly. Gave pt handout to review.      Intervention Plan   Intervention Nutrition handout(s) given to patient.;Prescribe, educate and counsel regarding individualized specific dietary modifications aiming towards targeted core components such as weight, hypertension, lipid management, diabetes, heart failure and other comorbidities.    Expected Outcomes Short Term Goal: A plan has been developed with personal nutrition goals set during dietitian appointment.;Short Term Goal: Understand basic principles of dietary content, such as calories, fat, sodium, cholesterol and nutrients.;Long Term Goal: Adherence to prescribed nutrition plan.           Nutrition Assessments:  Nutrition Assessments - 08/02/19 1006      MEDFICTS  Scores   Pre Score 56           MEDIFICTS Score Key:          ?70 Need to make dietary changes          40-70 Heart Healthy Diet         ? 40 Therapeutic Level Cholesterol Diet  Nutrition Goals Re-Evaluation:   Nutrition Goals Discharge (Final Nutrition Goals Re-Evaluation):   Psychosocial: Target Goals: Acknowledge presence or absence of significant depression and/or stress, maximize coping skills, provide positive support system. Participant is able to verbalize types and ability to use techniques and skills needed for reducing stress and depression.   Education: Depression - Provides group verbal and written instruction on the correlation between heart/lung disease and depressed mood, treatment options, and the stigmas associated with seeking treatment.   Education: Sleep Hygiene -Provides group verbal and written instruction about how sleep can affect your health.  Define sleep hygiene, discuss sleep cycles and impact of sleep habits. Review good sleep hygiene tips.     Education: Stress and Anxiety: - Provides group verbal and written instruction about the health risks of elevated stress and causes of high stress.  Discuss the correlation between heart/lung disease and anxiety and treatment options. Review healthy ways to manage with stress and anxiety.    Initial Review & Psychosocial Screening:  Initial Psych Review & Screening - 07/28/19 0940      Initial Review   Current issues with None Identified      Family Dynamics   Good Support System? Yes    Comments He can look to his girlfriend and daughter for support.      Barriers   Psychosocial barriers to participate in program The patient should benefit from training in stress management and relaxation.      Screening Interventions   Interventions Encouraged to exercise;To provide support and resources with identified psychosocial needs;Provide feedback about the scores to participant    Expected Outcomes  Short Term goal: Utilizing psychosocial counselor, staff and physician to assist with identification of specific Stressors or current issues interfering with healing process. Setting desired goal for each stressor or current issue identified.;Long Term Goal: Stressors or current issues are controlled or eliminated.;Short Term goal: Identification and review with participant of any Quality of Life or Depression concerns found by scoring the questionnaire.;Long Term goal: The participant improves  quality of Life and PHQ9 Scores as seen by post scores and/or verbalization of changes           Quality of Life Scores:   Quality of Life - 08/02/19 1006      Quality of Life   Select Quality of Life      Quality of Life Scores   Health/Function Pre 11.07 %    Socioeconomic Pre 21.56 %    Psych/Spiritual Pre 13.29 %    Family Pre 24 %    GLOBAL Pre 15.76 %          Scores of 19 and below usually indicate a poorer quality of life in these areas.  A difference of  2-3 points is a clinically meaningful difference.  A difference of 2-3 points in the total score of the Quality of Life Index has been associated with significant improvement in overall quality of life, self-image, physical symptoms, and general health in studies assessing change in quality of life.  PHQ-9: Recent Review Flowsheet Data    Depression screen [facility] 2/9 08/30/2019 08/19/2019 08/02/2019 05/25/2019 03/23/2019   Decreased Interest 2 0 3 0 0   Down, Depressed, Hopeless '2 2 1 ' 0 0   PHQ - 2 Score '4 2 4 ' 0 0   Altered sleeping 3 0 1 - -   Tired, decreased energy 3 0 2 - -   Change in appetite 0 0 1 - -   Feeling bad or failure about yourself  '1 2 2 ' - -   Trouble concentrating 2 0 0 - -   Moving slowly or fidgety/restless 1 0 0 - -   Suicidal thoughts 0 0 0 - -   PHQ-9 Score '14 4 10 ' - -   Difficult doing work/chores Somewhat difficult Not difficult at all Not difficult at all - -     Interpretation of Total Score  Total Score  Depression Severity:  1-4 = Minimal depression, 5-9 = Mild depression, 10-14 = Moderate depression, 15-19 = Moderately severe depression, 20-27 = Severe depression   Psychosocial Evaluation and Intervention:  Psychosocial Evaluation - 07/28/19 0941      Psychosocial Evaluation & Interventions   Interventions Encouraged to exercise with the program and follow exercise prescription;Stress management education    Comments Edwyn has a positive outlook on his health and is ready to start rehab.    Expected Outcomes Short: Attend [facility] stress management education to decrease stress. Long: Maintain exercise Post [facility] to keep stress at a minimum.    Continue Psychosocial Services  Follow up required by staff           Psychosocial Re-Evaluation:  Psychosocial Re-Evaluation    [place] Name 08/30/19 0813 09/01/19 0830           Psychosocial Re-Evaluation   Current issues with Current Depression;Current Psychotropic Meds;Current Stress Concerns --      Comments Reviewed patient health questionnaire (PHQ-9) with patient for follow up. Previously, patients score indicated signs/symptoms of depression.  Reviewed to see if patient is improving symptom wise while in program.  Score declined and patient states that it is because he feels like he is moving backwards with his health. He states that he has no other depression except for his health. He spoke to the doctor and has started taking Prozac to help his mood. Informed him that he is taking all the right steps to keep up with his help and to let staff know what we can do for  him. [place] did speak with his Dr about Prozac and he cant take it due to interaction with Plavix.  He will follow up about other meds.      Expected Outcomes Short: Continue to work toward an improvement in [place] scores by attending [facility] regularly. Long: Continue to improve stress and depression coping skills by talking with staff and attending [facility] regularly and work  toward a positive mental state. Short:  follow up with Dr , try mindfulness Long:  work towards positive outlook      Interventions Encouraged to attend Cardiac Rehabilitation for the exercise --      Continue Psychosocial Services  Follow up required by staff --             Psychosocial Discharge (Final Psychosocial Re-Evaluation):  Psychosocial Re-Evaluation - 09/01/19 0830      Psychosocial Re-Evaluation   Comments [place] did speak with his Dr about Prozac and he cant take it due to interaction with Plavix.  He will follow up about other meds.    Expected Outcomes Short:  follow up with Dr , try mindfulness Long:  work towards positive outlook           Vocational Rehabilitation: Provide vocational rehab assistance to qualifying candidates.   Vocational Rehab Evaluation & Intervention:   Education: Education Goals: Education classes will be provided on a variety of topics geared toward better understanding of heart health and risk factor modification. Participant will state understanding/return demonstration of topics presented as noted by education test scores.  Learning Barriers/Preferences:  Learning Barriers/Preferences - 07/28/19 0941      Learning Barriers/Preferences   Learning Barriers None    Learning Preferences None           General Cardiac Education Topics:  AED/CPR: - Group verbal and written instruction with the use of models to demonstrate the basic use of the AED with the basic ABC's of resuscitation.   Anatomy & Physiology of the Heart: - Group verbal and written instruction and models provide basic cardiac anatomy and physiology, with the coronary electrical and arterial systems. Review of Valvular disease and Heart Failure   Cardiac Procedures: - Group verbal and written instruction to review commonly prescribed medications for heart disease. Reviews the medication, class of the drug, and side effects. Includes the steps to properly store meds and  maintain the prescription regimen. (beta blockers and nitrates)   Cardiac Medications I: - Group verbal and written instruction to review commonly prescribed medications for heart disease. Reviews the medication, class of the drug, and side effects. Includes the steps to properly store meds and maintain the prescription regimen.   Cardiac Medications II: -Group verbal and written instruction to review commonly prescribed medications for heart disease. Reviews the medication, class of the drug, and side effects. (all other drug classes)    Go Sex-Intimacy & Heart Disease, Get SMART - Goal Setting: - Group verbal and written instruction through game format to discuss heart disease and the return to sexual intimacy. Provides group verbal and written material to discuss and apply goal setting through the application of the S.M.A.R.T. Method.   Other Matters of the Heart: - Provides group verbal, written materials and models to describe Stable Angina and Peripheral Artery. Includes description of the disease process and treatment options available to the cardiac patient.   Infection Prevention: - Provides verbal and written material to individual with discussion of infection control including proper hand washing and proper equipment cleaning during exercise session.  Cardiac Rehab from 07/28/2019 in [facility] Cardiac [facility] Pulmonary Re[facility]  Date 07/28/19  Educator [facility]  Instruction Review Code 1- Verbalizes Understanding      Falls Prevention: - Provides verbal and written material to individual with discussion of falls prevention and safety.   Cardiac Rehab from 07/28/2019 in [facility] Cardiac and Pulmonary Rehab  Date 07/28/19  Educator [facility]  Instruction Review Code 1- Verbalizes Understanding      Other: -Provides group and verbal instruction on various topics (see comments)   Knowledge Questionnaire Score:  Knowledge Questionnaire Score - 08/02/19 1006      Knowledge Questionnaire Score    Pre Score 23/26 Education Focus: NTG, angina, Exercise           Core Components/Risk Factors/Patient Goals at Admission:  Personal Goals and Risk Factors at Admission - 08/02/19 1007      Core Components/Risk Factors/Patient Goals on Admission    Weight Management Yes;Weight Loss    Intervention Weight Management: Develop a combined nutrition and exercise program designed to reach desired caloric intake, while maintaining appropriate intake of nutrient and fiber, sodium and fats, and appropriate energy expenditure required for the weight goal.;Weight Management: Provide education and appropriate resources to help participant work on and attain dietary goals.    Admit Weight 189 lb (85.7 kg)    Goal Weight: Short Term 184 lb (83.5 kg)    Goal Weight: Long Term 179 lb (81.2 kg)    Expected Outcomes Short Term: Continue to assess and modify interventions until short term weight is achieved;Long Term: Adherence to nutrition and physical activity/exercise program aimed toward attainment of established weight goal;Weight Loss: Understanding of general recommendations for a balanced deficit meal plan, which promotes 1-2 lb weight loss per week and includes a negative energy balance of [phone] kcal/d;Understanding recommendations for meals to include 15-35% energy as protein, 25-35% energy from fat, 35-60% energy from carbohydrates, less than 235m of dietary cholesterol, 20-35 gm of total fiber daily;Understanding of distribution of calorie intake throughout the day with the consumption of 4-5 meals/snacks    Heart Failure Yes    Intervention Provide a combined exercise and nutrition program that is supplemented with education, support and counseling about heart failure. Directed toward relieving symptoms such as shortness of breath, decreased exercise tolerance, and extremity edema.    Expected Outcomes Improve functional capacity of life;Short term: Attendance in program 2-3 days a week with increased  exercise capacity. Reported lower sodium intake. Reported increased fruit and vegetable intake. Reports medication compliance.;Short term: Daily weights obtained and reported for increase. Utilizing diuretic protocols set by physician.;Long term: Adoption of self-care skills and reduction of barriers for early signs and symptoms recognition and intervention leading to self-care maintenance.    Hypertension Yes    Intervention Provide education on lifestyle modifcations including regular physical activity/exercise, weight management, moderate sodium restriction and increased consumption of fresh fruit, vegetables, and low fat dairy, alcohol moderation, and smoking cessation.;Monitor prescription use compliance.    Expected Outcomes Short Term: Continued assessment and intervention until BP is < 140/934mHG in hypertensive participants. < 130/8055mG in hypertensive participants with diabetes, heart failure or chronic kidney disease.;Long Term: Maintenance of blood pressure at goal levels.    Lipids Yes    Intervention Provide education and support for participant on nutrition & aerobic/resistive exercise along with prescribed medications to achieve LDL <51m34mDL >40mg72m Expected Outcomes Short Term: Participant states understanding of desired cholesterol values and is compliant with medications prescribed.  Participant is following exercise prescription and nutrition guidelines.;Long Term: Cholesterol controlled with medications as prescribed, with individualized exercise RX and with personalized nutrition plan. Value goals: LDL < 15m, HDL > 40 mg.           Education:Diabetes - Individual verbal and written instruction to review signs/symptoms of diabetes, desired ranges of glucose level fasting, after meals and with exercise. Acknowledge that pre and post exercise glucose checks will be done for 3 sessions at entry of program.   Education: Know Your Numbers and Risk Factors: -Group verbal and  written instruction about important numbers in your health.  Discussion of what are risk factors and how they play a role in the disease process.  Review of Cholesterol, Blood Pressure, Diabetes, and BMI and the role they play in your overall health.   Core Components/Risk Factors/Patient Goals Review:   Goals and Risk Factor Review    Row Name 09/01/19 0828             Core Components/Risk Factors/Patient Goals Review   Personal Goals Review Weight Management/Obesity;Hypertension       Review TMasoudis taking all meds as directed.  His weight stays steady.  We reviewed home exercise and the importance of sustained cardio exercise.       Expected Outcomes Short: attend consistently Long: maintain heart health              Core Components/Risk Factors/Patient Goals at Discharge (Final Review):   Goals and Risk Factor Review - 09/01/19 0828      Core Components/Risk Factors/Patient Goals Review   Personal Goals Review Weight Management/Obesity;Hypertension    Review TClavinis taking all meds as directed.  His weight stays steady.  We reviewed home exercise and the importance of sustained cardio exercise.    Expected Outcomes Short: attend consistently Long: maintain heart health           ITP Comments:  ITP Comments    Row Name 07/28/19 0944 08/02/19 0941 08/11/19 1055 08/24/19 0531 09/21/19 0542   ITP Comments Virtual Orientation performed. Patient informed when to come in for RD and EP orientation. [facility] Completed 6MWT and gym orientation.  Initial ITP created and sent for review to Dr. MEmily Filbert Medical Director. First full day of exercise!  Patient was oriented to gym and equipment including functions, settings, policies, and procedures.  Patient's individual exercise prescription and treatment plan were reviewed.  All starting workloads were established based on the results of the 6 minute walk test done at initial orientation visit.  The plan  for exercise progression was also introduced and progression will be customized based on patient's performance and goals 30 Day review completed. ITP review done, changes made as directed,and approval shown by signature of  pScientist, research (life sciences)  New to program 30 Day review completed. Medical Director ITP review done, changes made as directed, and signed approval by Medical Director.          Comments: 30 Day review completed. Medical Director ITP review done, changes made as directed, and signed approval by Medical Director.

## 2019-09-22 ENCOUNTER — Ambulatory Visit (INDEPENDENT_AMBULATORY_CARE_PROVIDER_SITE_OTHER): Payer: Medicare Other

## 2019-09-22 ENCOUNTER — Encounter (INDEPENDENT_AMBULATORY_CARE_PROVIDER_SITE_OTHER): Payer: Self-pay | Admitting: Nurse Practitioner

## 2019-09-22 ENCOUNTER — Telehealth (INDEPENDENT_AMBULATORY_CARE_PROVIDER_SITE_OTHER): Payer: Self-pay

## 2019-09-22 ENCOUNTER — Other Ambulatory Visit (INDEPENDENT_AMBULATORY_CARE_PROVIDER_SITE_OTHER): Payer: Self-pay | Admitting: Nurse Practitioner

## 2019-09-22 ENCOUNTER — Ambulatory Visit (INDEPENDENT_AMBULATORY_CARE_PROVIDER_SITE_OTHER): Payer: Medicare Other | Admitting: Nurse Practitioner

## 2019-09-22 ENCOUNTER — Encounter (INDEPENDENT_AMBULATORY_CARE_PROVIDER_SITE_OTHER): Payer: Self-pay

## 2019-09-22 VITALS — BP 103/57 | HR 92 | Resp 16 | Wt 186.0 lb

## 2019-09-22 DIAGNOSIS — I770 Arteriovenous fistula, acquired: Secondary | ICD-10-CM

## 2019-09-22 DIAGNOSIS — I739 Peripheral vascular disease, unspecified: Secondary | ICD-10-CM

## 2019-09-22 DIAGNOSIS — Z992 Dependence on renal dialysis: Secondary | ICD-10-CM | POA: Diagnosis not present

## 2019-09-22 DIAGNOSIS — T81718A Complication of other artery following a procedure, not elsewhere classified, initial encounter: Secondary | ICD-10-CM | POA: Diagnosis not present

## 2019-09-22 DIAGNOSIS — E785 Hyperlipidemia, unspecified: Secondary | ICD-10-CM | POA: Diagnosis not present

## 2019-09-22 DIAGNOSIS — I729 Aneurysm of unspecified site: Secondary | ICD-10-CM

## 2019-09-22 DIAGNOSIS — N186 End stage renal disease: Secondary | ICD-10-CM

## 2019-09-22 NOTE — Telephone Encounter (Signed)
Patient was seen in office today and is now scheduled with Dr. Lucky Cowboy on 09/29/19 for a Thrombin injection with a 8:15 am arrival time to the [facility]. Patient will do covid testing on 09/27/19 between 8-1 pm at the [facility]. Pre-procedure instructions were discussed and handed to the patient.

## 2019-09-22 NOTE — Progress Notes (Signed)
Subjective:    Patient ID: Jeremy [name], male    DOB: [birth year], 64 y.o.   MRN: [id number] Chief Complaint  Patient presents with  . Follow-up    ultrasound follow up    The patient returns today for further noninvasive studies related to left lower extremity leg pain after intervention.  Patient underwent intervention on 08/25/2019 and subsequently began having severe bruising and pain in his left groin.  Yesterday, ABIs were done which showed no evidence of ischemic changes.  A prednisone Dosepak in addition to Percocet were called in for the patient.  The patient reports that today the pain is much improved with the addition of the prednisone.  The patient was made somewhat nauseated by the Percocet.  The patient denies any worsening bleeding of his fistula either.  Today noninvasive studies show his right radiocephalic AV fistula has a flow volume of 839 with no evidence of significant stenosis.  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  The neck feeds from the common femoral artery and is very short and measures 0.19 cm in diameter.  Thrombus is seen in the lower 1/4 portion of the pseudoaneurysm.   Review of Systems  Cardiovascular: Positive for leg swelling.  Musculoskeletal:       Leg pain  Hematological: Bruises/bleeds easily.       Objective:   Physical Exam Vitals reviewed.  HENT:     Head: Normocephalic.  Cardiovascular:     Rate and Rhythm: Normal rate.  Pulmonary:     Effort: Pulmonary effort is normal.     Breath sounds: Normal breath sounds.  Neurological:     Mental Status: He is alert and oriented to person, place, and time.  Psychiatric:        Mood and Affect: Mood normal.        Behavior: Behavior normal.        Thought Content: Thought content normal.        Judgment: Judgment normal.     BP (!) 103/57 (BP Location: Right Arm)    Pulse 92   Resp 16   Wt 186 lb (84.4 kg)   BMI 26.69 kg/m   Past Medical History:  Diagnosis Date  . Abnormal stress test 03/19/2018  . 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 Aortic stenosis    a. 2014 s/p mech AVR ([facility])-->chronic coumadin; b. 08/2018 Echo: Mild AS/AI. Nl fxn/gradient.  . Arthritis   . Atrial fibrillation ([place])   . CAD (coronary artery disease)    a. 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 02/2018 MV [facility]): large inf, infsept, inflat infarct w/ min rev, EF 30%; c. 06/2018 Cath: LM min irregs, LAD 60p, 95/30/47m, LCX min irregs, RCA 40p, 1m, 90d, RPAV 90, RPDA 95ost; c. 09/2018 CABGx3 (LIMA->LAD, VG->D1, VG->PDA).  . Chronic anticoagulation 06/2012   a. Coumadin in the setting of mech AVR and afib.  Marland Kitchen COVID-19 03/15/2019  . CPAP (continuous positive airway pressure) dependence   . Dialysis patient [facility]) 04/07/2006   Patient started HD around 1996,  had 1st transplant LLQ around 1998 until 2007 when they found renal cell cancer in transplant and both native kidneys, all were removed > went back on HD until 2nd transplant RLQ in 2014 which lasted  3 yrs; it was embolized for suspected acute rejection. Is currently on HD MWF in [place] w/ [facility] nephrology.  . ESRD (end stage renal disease) ([place])    a. CKD 2/2 to IgA nephropathy (dx age 43); b. 1999 s/p Kidney transplant; c. 2014 Bilat nephrectomy (transplanted kidney resected) in the setting of renal cell carcinoma; d. PD until 11/2017-->HD.  Marland Kitchen Heart murmur   . HFrEF (heart failure with reduced ejection fraction) ([place])    a. 07/2016 Echo: EF 55-60%; b. 02/2018 MV: EF 30%; c. 04/2018 Echo: EF 35%; d. 08/2018 Echo: EF 30-35%. Glob HK w/o rwma. Mildly reduced RV fxn. Mod to sev dil LA. Nl AoV fxn.  Marland Kitchen Hx of colonoscopy    2009 by Dr. Laural Golden. Normal except for small submucosal lipoma. No evidence of polyps or colitis.  . Hyperlipidemia   . Hypertension   .  Hypogonadism in male 07/25/2015  . Ischemic cardiomyopathy    a. 07/2016 Echo: EF 55-60%; b. 02/2018 MV: EF 30%; c. 04/2018 Echo: EF 35%; d. 08/2018 Echo: EF 30-35%.  . OSA (obstructive sleep apnea)    No CPAP  . Permanent atrial fibrillation ([facility])    a. CHA2DS2VASc = 3-->Coumadin (also mech AVR); b. 06/2018 Amio started 2/2 elevated rates.  . Renal cell cancer ([place])   . Renal cell carcinoma ([place]) 08/04/2016    Social History   Socioeconomic History  . Marital status: Divorced    Spouse name: Not on file  . Number of children: Not on file  . Years of education: Not on file  . Highest education level: High school graduate  Occupational History  . 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 Smoking status: Never Smoker  . Smokeless tobacco: Never Used  Vaping Use  . Vaping Use: Never used  Substance and Sexual Activity  . Alcohol use: No  . Drug use: No  . Sexual activity: Not Currently    Partners: Female  Other Topics Concern  . Not on file  Social History Narrative   Lives at home with girlfriend   1 daughter 52    Social Determinants of Health   Financial Resource Strain:   . Difficulty of Paying Living Expenses:   Food Insecurity:   . Worried About Charity fundraiser in the Last Year:   . Arboriculturist in the Last Year:   Transportation Needs:   . Film/video editor (Medical):   Marland Kitchen Lack of Transportation (Non-Medical):   Physical Activity:   . Days of Exercise per Week:   . Minutes of Exercise per Session:   Stress:   . Feeling of Stress :   Social Connections:   . Frequency of Communication with Friends and Family:   . Frequency of Social Gatherings with Friends and Family:   . Attends Religious Services:   . Active Member of Clubs or Organizations:   . Attends Archivist Meetings:   Marland Kitchen Marital Status:   Intimate Partner Violence:   . Fear of Current or Ex-Partner:   . Emotionally Abused:   Marland Kitchen Physically Abused:   .  Sexually Abused:     Past Surgical History:  Procedure Laterality Date  . AORTIC VALVE REPLACEMENT  3 /11 /2014   At [place]    . CAPD INSERTION N/A 02/19/2017   Procedure: LAPAROSCOPIC INSERTION CONTINUOUS AMBULATORY PERITONEAL DIALYSIS  (CAPD) CATHETER;  Surgeon: Algernon Huxley,  MD;  Location: [facility];  Service: General;  Laterality: N/A;  . CORONARY ANGIOPLASTY WITH STENT PLACEMENT    . CORONARY ARTERY BYPASS GRAFT N/A 09/16/2018   Procedure: CORONARY ARTERY BYPASS GRAFT times three      with left internal mammary artery and endoharvest of right leg greater saphenous vein;  Surgeon: Melrose Nakayama, MD;  Location: [place];  Service: Open Heart Surgery;  Laterality: N/A;  . CORONARY STENT INTERVENTION N/A 07/15/2019   Procedure: CORONARY STENT INTERVENTION;  Surgeon: Wellington Hampshire, MD;  Location: [facility] [place] [facility];  Service: Cardiovascular;  Laterality: N/A;  . DG AV DIALYSIS  SHUNT ACCESS EXIST*L* OR     left arm  . EPIGASTRIC HERNIA REPAIR N/A 02/19/2017   Procedure: HERNIA REPAIR EPIGASTRIC ADULT;  Surgeon: Robert Bellow, MD;  Location: [facility];  Service: General;  Laterality: N/A;  . EYE SURGERY     bilateral cataract  . HERNIA REPAIR     UMBILICAL HERNIA  . HERNIA REPAIR  02/19/2017  . INNER EAR SURGERY     20 years ago  . KIDNEY TRANSPLANT    . LEFT HEART CATH AND CORS/GRAFTS ANGIOGRAPHY N/A 07/15/2019   Procedure: LEFT HEART CATH AND CORS/GRAFTS ANGIOGRAPHY;  Surgeon: Wellington Hampshire, MD;  Location: [place] [facility];  Service: Cardiovascular;  Laterality: N/A;  . LOWER EXTREMITY ANGIOGRAPHY Right 08/25/2019   Procedure: LOWER EXTREMITY ANGIOGRAPHY;  Surgeon: Algernon Huxley, MD;  Location: [place] [facility];  Service: Cardiovascular;  Laterality: Right;  . Peritoneal Dialysis access placement  02/19/2017  . REMOVAL OF A DIALYSIS CATHETER N/A 11/23/2017   Procedure: REMOVAL OF A PERITONEAL DIALYSIS CATHETER;  Surgeon:  Algernon Huxley, MD;  Location: [facility];  Service: Vascular;  Laterality: N/A;  . RIGHT/LEFT HEART CATH AND CORONARY ANGIOGRAPHY Bilateral 06/07/2018   Procedure: RIGHT/LEFT HEART CATH AND CORONARY ANGIOGRAPHY;  Surgeon: Wellington Hampshire, MD;  Location: [place] [facility];  Service: Cardiovascular;  Laterality: Bilateral;  . TEE WITHOUT CARDIOVERSION N/A 07/21/2016   Procedure: TRANSESOPHAGEAL ECHOCARDIOGRAM (TEE);  Surgeon: Wellington Hampshire, MD;  Location: [facility];  Service: Cardiovascular;  Laterality: N/A;  . TEE WITHOUT CARDIOVERSION N/A 09/16/2018   Procedure: TRANSESOPHAGEAL ECHOCARDIOGRAM (TEE);  Surgeon: Melrose Nakayama, MD;  Location: [place];  Service: Open Heart Surgery;  Laterality: N/A;    Family History  Problem Relation Age of Onset  . Cancer Mother        pancreatic  . Heart disease Father   . Diabetes Father     Allergies  Allergen Reactions  . Statins     Other reaction(s): Arthralgias (intolerance), Myalgias (intolerance)  Pt taking pravastatin   . Sertraline Rash       Assessment & Plan:   1. Pseudoaneurysm following procedure ([place]) Recommend:  Post procedure the patient has developed a large pseudoaneurysm in the left groin area.    Given the severity of the patient's lower extremity symptoms the patient should undergo a thrombin injection.  Risk and benefits were reviewed the patient.  Indications for the procedure were reviewed.  All questions were answered, the patient agrees to proceed.   The patient should continue walking and begin a more formal exercise program.  The patient should continue antiplatelet therapy and aggressive treatment of the lipid abnormalities  The patient will follow up with me after the intervention.  2. AV fistula ([employer]) HD reveals no evidence of significant stenosis.  Patient's recent bleeding episodes are more likely  due to anticoagulation.  Recommend:  The patient is doing well and currently has adequate dialysis  access.  Flow pattern is stable when compared to the prior ultrasound.  The patient should have a duplex ultrasound of the dialysis access in 6 months. The patient will follow-up with me in the office after each ultrasound     3. Hyperlipidemia, unspecified hyperlipidemia type Continue statin as ordered and reviewed, no changes at this time    Current Outpatient Medications on File Prior to Visit  Medication Sig Dispense Refill  . acetaminophen (TYLENOL) 500 MG tablet Take 1,000 mg 2 (two) times daily as needed by mouth for moderate pain.    Marland Kitchen allopurinol (ZYLOPRIM) 300 MG tablet Take 300 mg by mouth daily.  5  . cinacalcet (SENSIPAR) 30 MG tablet Take 30 mg by mouth every other day.     . clopidogrel (PLAVIX) 75 MG tablet Take 1 tablet (75 mg total) by mouth daily with breakfast. 90 tablet 3  . Epoetin Alfa-epbx (RETACRIT IJ) Epoetin alfa - epbx (Retacrit)    . hydroxychloroquine (PLAQUENIL) 200 MG tablet Take 200 mg by mouth 2 (two) times daily.    Marland Kitchen lidocaine-prilocaine (EMLA) cream Apply 1 application topically as needed (for fistula).    . loratadine (CLARITIN) 10 MG tablet Take 10 mg by mouth daily.    . metoprolol tartrate (LOPRESSOR) 25 MG tablet Take 1 tablet (25 mg total) by mouth in the morning, at noon, and at bedtime. 270 tablet 3  . midodrine (PROAMATINE) 10 MG tablet     . multivitamin (RENA-VIT) TABS tablet Take 1 tablet by mouth daily.    . nitroGLYCERIN (NITROSTAT) 0.4 MG SL tablet Place 1 tablet (0.4 mg total) under the tongue every 5 (five) minutes as needed for chest pain. 25 tablet 3  . oxyCODONE-acetaminophen (PERCOCET/ROXICET) 5-325 MG tablet Take 1-2 tablets by mouth every 6 (six) hours as needed for severe pain. 30 tablet 0  . pantoprazole (PROTONIX) 40 MG tablet Take 1 tablet (40 mg total) by mouth daily. 90 tablet 3  . predniSONE (DELTASONE) 5 MG tablet Take 5 mg by mouth daily with breakfast.     . rosuvastatin (CRESTOR) 40 MG tablet Take 1 tablet (40 mg  total) by mouth daily at 6 PM. 90 tablet 3  . sevelamer carbonate (RENVELA) 800 MG tablet Take 4,000 mg by mouth 3 (three) times daily with meals. 5 tabs per meal     . warfarin (COUMADIN) 1 MG tablet Take 1-1.5 tablets (1-1.5 mg total) by mouth See admin instructions. Take 1mg  on Sun, Tues, Thur and Sat Take 1.5mg  on Mon, Wed, Fri 180 tablet 1  . ezetimibe (ZETIA) 10 MG tablet Take 1 tablet (10 mg total) by mouth daily. 90 tablet 3  . FLUoxetine (PROZAC) 20 MG tablet TAKE 1 TABLET BY MOUTH EVERY DAY 90 tablet 2   No current facility-administered medications on file prior to visit.    There are no Patient Instructions on file for this visit. No follow-ups on file.   Kris Hartmann, NP

## 2019-09-23 ENCOUNTER — Telehealth: Payer: Self-pay | Admitting: *Deleted

## 2019-09-23 ENCOUNTER — Ambulatory Visit (INDEPENDENT_AMBULATORY_CARE_PROVIDER_SITE_OTHER): Payer: Medicare Other | Admitting: Nurse Practitioner

## 2019-09-23 ENCOUNTER — Encounter: Payer: Self-pay | Admitting: Cardiovascular Disease

## 2019-09-23 ENCOUNTER — Ambulatory Visit (INDEPENDENT_AMBULATORY_CARE_PROVIDER_SITE_OTHER): Payer: Medicare Other | Admitting: Cardiovascular Disease

## 2019-09-23 ENCOUNTER — Encounter: Payer: Self-pay | Admitting: *Deleted

## 2019-09-23 ENCOUNTER — Encounter (INDEPENDENT_AMBULATORY_CARE_PROVIDER_SITE_OTHER): Payer: Medicare Other

## 2019-09-23 DIAGNOSIS — Z5181 Encounter for therapeutic drug level monitoring: Secondary | ICD-10-CM

## 2019-09-23 DIAGNOSIS — Z955 Presence of coronary angioplasty implant and graft: Secondary | ICD-10-CM

## 2019-09-23 LAB — POCT INR: INR: 2.7 (ref 2.0–3.0)

## 2019-09-23 NOTE — Patient Instructions (Addendum)
Description   Instructed pt to continue to take 1 tablet of warfarin daily. Eat an extra serving of greens over the weekend. Recheck INR on Monday 6/21.

## 2019-09-23 NOTE — Telephone Encounter (Signed)
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  They will doing a clot removal next week and started him on prednisone for now to help with pain.  We took out visits for the next two weeks with hopes that he can return on 7/6.

## 2019-09-23 NOTE — Telephone Encounter (Signed)
This encounter was created in error - please disregard.

## 2019-09-26 ENCOUNTER — Encounter (INDEPENDENT_AMBULATORY_CARE_PROVIDER_SITE_OTHER): Payer: Self-pay | Admitting: Nurse Practitioner

## 2019-09-26 ENCOUNTER — Ambulatory Visit (INDEPENDENT_AMBULATORY_CARE_PROVIDER_SITE_OTHER): Payer: Medicare Other

## 2019-09-26 ENCOUNTER — Encounter: Payer: Self-pay | Admitting: Cardiovascular Disease

## 2019-09-26 DIAGNOSIS — Z952 Presence of prosthetic heart valve: Secondary | ICD-10-CM | POA: Diagnosis not present

## 2019-09-26 DIAGNOSIS — Z5181 Encounter for therapeutic drug level monitoring: Secondary | ICD-10-CM | POA: Diagnosis not present

## 2019-09-26 DIAGNOSIS — I4811 Longstanding persistent atrial fibrillation: Secondary | ICD-10-CM | POA: Diagnosis not present

## 2019-09-26 LAB — LAB REPORT - SCANNED: INR: 2.6

## 2019-09-26 LAB — POCT INR: INR: 2.6 (ref 2.0–3.0)

## 2019-09-26 NOTE — Patient Instructions (Signed)
Sent MyChart message to pt advising him to continue to take 1 tablet of warfarin daily.  Recheck INR in 1 week.

## 2019-09-26 NOTE — Progress Notes (Signed)
Subjective:    Patient ID: Jeremy [name], male    DOB: Mar 31, 1956, 64 y.o.   MRN: [id number] Chief Complaint  Patient presents with  . Follow-up    U/S follow up    The patient presents today after angiogram on 08/25/2019.  He underwent angioplasty of the right tibioperoneal trunk and proximal peroneal artery.  The patient had a left femoral access.  The patient notes that since his procedure he has had severe left lower extremity pain.  He notes that immediately following the procedure he has severe bruising which is still in place down his medial thigh.  The patient notes that the pain is severe.  He has tried muscle relaxers, tramadol and hydrocodone, none of which helped with his pain relief.  The patient has underwent a CABG in addition to multiple renal transplants and he states that this is the worst pain he has had despite the surgeries.  It is difficult for him to walk and he notes that the pain has been increasing daily.  The patient also had a massage which was not helpful for the pain.  He denies any fever, chills, nausea, vomiting or diarrhea.  He denies any chest pain or shortness of breath.  He denies any rest pain or ischemic-like symptoms.  The patient is also a dialysis patient and notes that he has had extensive bleeding from his radiocephalic fistula lately.  He states that 1 day it took well over an hour to achieve hemostasis.   Today noninvasive studies show noncompressible ABIs bilaterally with a TBI of 0.96 on the right and a TBI 0.60 on the left.  Previously his TBI was 0.61 on the right and 0.80 on the left.  The patient has biphasic tibial artery waveforms bilaterally with good toe waveforms bilaterally.   Review of Systems  Musculoskeletal: Positive for gait problem and myalgias.  Hematological: Bruises/bleeds easily.       Objective:   Physical Exam Vitals reviewed.  HENT:     Head: Normocephalic.  Cardiovascular:     Rate and Rhythm: Normal rate and regular  rhythm.     Pulses:          Radial pulses are 2+ on the left side.     Arteriovenous access: left arteriovenous access is present.    Comments: Left-sided cephalic AV fistula with good thrill and bruit Pulmonary:     Effort: Pulmonary effort is normal.     Breath sounds: Normal breath sounds.  Musculoskeletal:        General: Tenderness present.  Skin:    Capillary Refill: Capillary refill takes less than 2 seconds.  Neurological:     Mental Status: He is alert and oriented to person, place, and time.  Psychiatric:        Mood and Affect: Mood normal.        Behavior: Behavior normal.        Thought Content: Thought content normal.        Judgment: Judgment normal.     BP 139/74   Pulse 93   Ht 5\' 10"  (1.778 m)   Wt 186 lb (84.4 kg)   BMI 26.69 kg/m   Past Medical History:  Diagnosis Date  . Abnormal stress test 03/19/2018  . 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 Aortic stenosis    a. 2014 s/p mech AVR ([facility])-->chronic coumadin; b. 08/2018 Echo: Mild AS/AI. Nl fxn/gradient.  . Arthritis   . Atrial fibrillation ([place])   .  CAD (coronary artery disease)    a. 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 02/2018 MV [facility]): large inf, infsept, inflat infarct w/ min rev, EF 30%; c. 06/2018 Cath: LM min irregs, LAD 60p, 95/30/67m, LCX min irregs, RCA 40p, 66m, 90d, RPAV 90, RPDA 95ost; c. 09/2018 CABGx3 (LIMA->LAD, VG->D1, VG->PDA).  . Chronic anticoagulation 06/2012   a. Coumadin in the setting of mech AVR and afib.  Marland Kitchen COVID-19 03/15/2019  . CPAP (continuous positive airway pressure) dependence   . Dialysis patient [facility]) 04/07/2006   Patient started HD around 1996,  had 1st transplant LLQ around 1998 until 2007 when they found renal cell cancer in transplant and both native kidneys, all were removed > went back on HD until 2nd transplant RLQ in 2014 which lasted 3 yrs; it was embolized for suspected acute rejection. Is  currently on HD MWF in [place] w/ [facility] nephrology.  . ESRD (end stage renal disease) ([place])    a. CKD 2/2 to IgA nephropathy (dx age 68); b. 1999 s/p Kidney transplant; c. 2014 Bilat nephrectomy (transplanted kidney resected) in the setting of renal cell carcinoma; d. PD until 11/2017-->HD.  Marland Kitchen Heart murmur   . HFrEF (heart failure with reduced ejection fraction) ([place])    a. 07/2016 Echo: EF 55-60%; b. 02/2018 MV: EF 30%; c. 04/2018 Echo: EF 35%; d. 08/2018 Echo: EF 30-35%. Glob HK w/o rwma. Mildly reduced RV fxn. Mod to sev dil LA. Nl AoV fxn.  Marland Kitchen Hx of colonoscopy    2009 by Dr. Laural Golden. Normal except for small submucosal lipoma. No evidence of polyps or colitis.  . Hyperlipidemia   . Hypertension   . Hypogonadism in male 07/25/2015  . Ischemic cardiomyopathy    a. 07/2016 Echo: EF 55-60%; b. 02/2018 MV: EF 30%; c. 04/2018 Echo: EF 35%; d. 08/2018 Echo: EF 30-35%.  . OSA (obstructive sleep apnea)    No CPAP  . Permanent atrial fibrillation ([facility])    a. CHA2DS2VASc = 3-->Coumadin (also mech AVR); b. 06/2018 Amio started 2/2 elevated rates.  . Renal cell cancer ([place])   . Renal cell carcinoma ([place]) 08/04/2016    Social History   Socioeconomic History  . Marital status: Divorced    Spouse name: Not on file  . Number of children: Not on file  . Years of education: Not on file  . Highest education level: High school graduate  Occupational History  . 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 Smoking status: Never Smoker  . Smokeless tobacco: Never Used  Vaping Use  . Vaping Use: Never used  Substance and Sexual Activity  . Alcohol use: No  . Drug use: No  . Sexual activity: Not Currently    Partners: Female  Other Topics Concern  . Not on file  Social History Narrative   Lives at home with girlfriend   1 daughter 73    Social Determinants of Health   Financial Resource Strain:   . Difficulty of Paying Living Expenses:   Food Insecurity:   . Worried About  Charity fundraiser in the Last Year:   . Arboriculturist in the Last Year:   Transportation Needs:   . Film/video editor (Medical):   Marland Kitchen Lack of Transportation (Non-Medical):   Physical Activity:   . Days of Exercise per Week:   . Minutes of Exercise per Session:  Stress:   . Feeling of Stress :   Social Connections:   . Frequency of Communication with Friends and Family:   . Frequency of Social Gatherings with Friends and Family:   . Attends Religious Services:   . Active Member of Clubs or Organizations:   . Attends Archivist Meetings:   Marland Kitchen Marital Status:   Intimate Partner Violence:   . Fear of Current or Ex-Partner:   . Emotionally Abused:   Marland Kitchen Physically Abused:   . Sexually Abused:     Past Surgical History:  Procedure Laterality Date  . AORTIC VALVE REPLACEMENT  3 /11 /2014   At [place]    . CAPD INSERTION N/A 02/19/2017   Procedure: LAPAROSCOPIC INSERTION CONTINUOUS AMBULATORY PERITONEAL DIALYSIS  (CAPD) CATHETER;  Surgeon: Algernon Huxley, MD;  Location: [facility] ORS;  Service: General;  Laterality: N/A;  . CORONARY ANGIOPLASTY WITH STENT PLACEMENT    . CORONARY ARTERY BYPASS GRAFT N/A 09/16/2018   Procedure: CORONARY ARTERY BYPASS GRAFT times three      with left internal mammary artery and endoharvest of right leg greater saphenous vein;  Surgeon: Melrose Nakayama, MD;  Location: [place];  Service: Open Heart Surgery;  Laterality: N/A;  . CORONARY STENT INTERVENTION N/A 07/15/2019   Procedure: CORONARY STENT INTERVENTION;  Surgeon: Wellington Hampshire, MD;  Location: [place] CV LAB;  Service: Cardiovascular;  Laterality: N/A;  . DG AV DIALYSIS  SHUNT ACCESS EXIST*L* OR     left arm  . EPIGASTRIC HERNIA REPAIR N/A 02/19/2017   Procedure: HERNIA REPAIR EPIGASTRIC ADULT;  Surgeon: Robert Bellow, MD;  Location: [facility];  Service: General;  Laterality: N/A;  . EYE SURGERY     bilateral cataract  . HERNIA  REPAIR     UMBILICAL HERNIA  . HERNIA REPAIR  02/19/2017  . INNER EAR SURGERY     20 years ago  . KIDNEY TRANSPLANT    . LEFT HEART CATH AND CORS/GRAFTS ANGIOGRAPHY N/A 07/15/2019   Procedure: LEFT HEART CATH AND CORS/GRAFTS ANGIOGRAPHY;  Surgeon: Wellington Hampshire, MD;  Location: [place] CV [facility];  Service: Cardiovascular;  Laterality: N/A;  . LOWER EXTREMITY ANGIOGRAPHY Right 08/25/2019   Procedure: LOWER EXTREMITY ANGIOGRAPHY;  Surgeon: Algernon Huxley, MD;  Location: [place] CV LAB;  Service: Cardiovascular;  Laterality: Right;  . Peritoneal Dialysis access placement  02/19/2017  . REMOVAL OF A DIALYSIS CATHETER N/A 11/23/2017   Procedure: REMOVAL OF A PERITONEAL DIALYSIS CATHETER;  Surgeon: Algernon Huxley, MD;  Location: [facility];  Service: Vascular;  Laterality: N/A;  . RIGHT/LEFT HEART CATH AND CORONARY ANGIOGRAPHY Bilateral 06/07/2018   Procedure: RIGHT/LEFT HEART CATH AND CORONARY ANGIOGRAPHY;  Surgeon: Wellington Hampshire, MD;  Location: [place] [facility];  Service: Cardiovascular;  Laterality: Bilateral;  . TEE WITHOUT CARDIOVERSION N/A 07/21/2016   Procedure: TRANSESOPHAGEAL ECHOCARDIOGRAM (TEE);  Surgeon: Wellington Hampshire, MD;  Location: [facility];  Service: Cardiovascular;  Laterality: N/A;  . TEE WITHOUT CARDIOVERSION N/A 09/16/2018   Procedure: TRANSESOPHAGEAL ECHOCARDIOGRAM (TEE);  Surgeon: Melrose Nakayama, MD;  Location: [place];  Service: Open Heart Surgery;  Laterality: N/A;    Family History  Problem Relation Age of Onset  . Cancer Mother        pancreatic  . Heart disease Father   . Diabetes Father     Allergies  Allergen Reactions  . Statins     Other reaction(s): Arthralgias (intolerance),  Myalgias (intolerance)  Pt taking pravastatin   . Sertraline Rash       Assessment & Plan:   1. Atherosclerosis of native arteries of the extremities with ulceration ([place]) Noninvasive studies today show that the stent is patent and there is no evidence of  reocclusion.  However, the patient is having extensive pain.  The patient could have a possible hematoma or pseudoaneurysm.  We will have the patient return tomorrow for noninvasive studies to evaluate for these.  In order to help the patient with his pain we will also prescribe a prednisone Dosepak to help with what is likely severe irritation as well as Percocet for pain.  2. Complication of vascular access for dialysis, sequela The patient has had extensive bleeding episode.  Because of this, we will have patient return for noninvasive studies to ensure that this is not related to any occlusion or stenosis.  However, the patient currently is on Plavix and Coumadin so this could be related to his anticoagulation.  3. Essential hypertension Continue antihypertensive medications as already ordered, these medications have been reviewed and there are no changes at this time.    Current Outpatient Medications on File Prior to Visit  Medication Sig Dispense Refill  . acetaminophen (TYLENOL) 500 MG tablet Take 1,000 mg 2 (two) times daily as needed by mouth for moderate pain.    Marland Kitchen allopurinol (ZYLOPRIM) 300 MG tablet Take 300 mg by mouth daily.  5  . cinacalcet (SENSIPAR) 30 MG tablet Take 30 mg by mouth every other day.     . clopidogrel (PLAVIX) 75 MG tablet Take 1 tablet (75 mg total) by mouth daily with breakfast. 90 tablet 3  . Epoetin Alfa-epbx (RETACRIT IJ) Epoetin alfa - epbx (Retacrit)    . hydroxychloroquine (PLAQUENIL) 200 MG tablet Take 200 mg by mouth 2 (two) times daily.    Marland Kitchen lidocaine-prilocaine (EMLA) cream Apply 1 application topically as needed (for fistula).    . loratadine (CLARITIN) 10 MG tablet Take 10 mg by mouth daily.    . metoprolol tartrate (LOPRESSOR) 25 MG tablet Take 1 tablet (25 mg total) by mouth in the morning, at noon, and at bedtime. 270 tablet 3  . multivitamin (RENA-VIT) TABS tablet Take 1 tablet by mouth daily.    . nitroGLYCERIN (NITROSTAT) 0.4 MG SL tablet  Place 1 tablet (0.4 mg total) under the tongue every 5 (five) minutes as needed for chest pain. 25 tablet 3  . pantoprazole (PROTONIX) 40 MG tablet Take 1 tablet (40 mg total) by mouth daily. 90 tablet 3  . predniSONE (DELTASONE) 5 MG tablet Take 5 mg by mouth daily with breakfast.     . rosuvastatin (CRESTOR) 40 MG tablet Take 1 tablet (40 mg total) by mouth daily at 6 PM. 90 tablet 3  . sevelamer carbonate (RENVELA) 800 MG tablet Take 4,000 mg by mouth 3 (three) times daily with meals. 5 tabs per meal     . warfarin (COUMADIN) 1 MG tablet Take 1-1.5 tablets (1-1.5 mg total) by mouth See admin instructions. Take 1mg  on Sun, Tues, Thur and Sat Take 1.5mg  on Mon, Wed, Fri 180 tablet 1  . ezetimibe (ZETIA) 10 MG tablet Take 1 tablet (10 mg total) by mouth daily. 90 tablet 3  . FLUoxetine (PROZAC) 20 MG tablet TAKE 1 TABLET BY MOUTH EVERY DAY 90 tablet 2  . midodrine (PROAMATINE) 10 MG tablet      No current facility-administered medications on file prior to visit.  There are no Patient Instructions on file for this visit. No follow-ups on file.   Kris Hartmann, NP

## 2019-09-26 NOTE — Telephone Encounter (Signed)
Please see anti-coag note for today. 

## 2019-09-26 NOTE — Telephone Encounter (Signed)
2.6 INR reading

## 2019-09-27 ENCOUNTER — Other Ambulatory Visit
Admission: RE | Admit: 2019-09-27 | Discharge: 2019-09-27 | Disposition: A | Discharge status: Home / Self Care | Payer: Medicare Other | Source: Ambulatory Visit | Attending: Vascular Surgery | Admitting: Vascular Surgery

## 2019-09-27 ENCOUNTER — Other Ambulatory Visit: Payer: Self-pay

## 2019-09-27 DIAGNOSIS — Z01812 Encounter for preprocedural laboratory examination: Secondary | ICD-10-CM | POA: Diagnosis present

## 2019-09-27 DIAGNOSIS — Z20822 Contact with and (suspected) exposure to covid-19: Secondary | ICD-10-CM | POA: Diagnosis not present

## 2019-09-27 LAB — SARS CORONAVIRUS 2 (TAT 6-24 HRS): SARS Coronavirus 2: NEGATIVE

## 2019-09-28 ENCOUNTER — Encounter (INDEPENDENT_AMBULATORY_CARE_PROVIDER_SITE_OTHER): Payer: Self-pay

## 2019-09-29 ENCOUNTER — Other Ambulatory Visit (INDEPENDENT_AMBULATORY_CARE_PROVIDER_SITE_OTHER): Payer: Self-pay | Admitting: Vascular Surgery

## 2019-09-29 ENCOUNTER — Inpatient Hospital Stay
Admission: AD | Admit: 2019-09-29 | Discharge: 2019-10-07 | DRG: 907 | Disposition: A | Discharge status: Home / Self Care | Payer: Medicare Other | Attending: Vascular Surgery | Admitting: Vascular Surgery

## 2019-09-29 ENCOUNTER — Other Ambulatory Visit: Payer: Self-pay

## 2019-09-29 ENCOUNTER — Other Ambulatory Visit (INDEPENDENT_AMBULATORY_CARE_PROVIDER_SITE_OTHER): Payer: Self-pay | Admitting: Nurse Practitioner

## 2019-09-29 ENCOUNTER — Encounter: Payer: Self-pay | Admitting: Vascular Surgery

## 2019-09-29 ENCOUNTER — Encounter
Admission: AD | Disposition: A | Discharge status: Home / Self Care | Payer: Self-pay | Source: Home / Self Care | Attending: Vascular Surgery

## 2019-09-29 ENCOUNTER — Encounter
Admission: RE | Disposition: A | Discharge status: Home / Self Care | Payer: Self-pay | Source: Home / Self Care | Attending: Vascular Surgery

## 2019-09-29 ENCOUNTER — Ambulatory Visit
Admission: RE | Admit: 2019-09-29 | Discharge: 2019-09-29 | Disposition: A | Discharge status: Home / Self Care | Payer: Medicare Other | Source: Home / Self Care | Attending: Vascular Surgery | Admitting: Vascular Surgery

## 2019-09-29 DIAGNOSIS — G8918 Other acute postprocedural pain: Secondary | ICD-10-CM

## 2019-09-29 DIAGNOSIS — L97519 Non-pressure chronic ulcer of other part of right foot with unspecified severity: Secondary | ICD-10-CM | POA: Diagnosis present

## 2019-09-29 DIAGNOSIS — R5082 Postprocedural fever: Secondary | ICD-10-CM | POA: Diagnosis not present

## 2019-09-29 DIAGNOSIS — Z992 Dependence on renal dialysis: Secondary | ICD-10-CM | POA: Insufficient documentation

## 2019-09-29 DIAGNOSIS — G4733 Obstructive sleep apnea (adult) (pediatric): Secondary | ICD-10-CM | POA: Diagnosis present

## 2019-09-29 DIAGNOSIS — I255 Ischemic cardiomyopathy: Secondary | ICD-10-CM | POA: Insufficient documentation

## 2019-09-29 DIAGNOSIS — I471 Supraventricular tachycardia, unspecified: Secondary | ICD-10-CM | POA: Diagnosis present

## 2019-09-29 DIAGNOSIS — I4821 Permanent atrial fibrillation: Secondary | ICD-10-CM | POA: Insufficient documentation

## 2019-09-29 DIAGNOSIS — I2510 Atherosclerotic heart disease of native coronary artery without angina pectoris: Secondary | ICD-10-CM | POA: Diagnosis present

## 2019-09-29 DIAGNOSIS — T8612 Kidney transplant failure: Secondary | ICD-10-CM | POA: Diagnosis present

## 2019-09-29 DIAGNOSIS — I729 Aneurysm of unspecified site: Secondary | ICD-10-CM

## 2019-09-29 DIAGNOSIS — E785 Hyperlipidemia, unspecified: Secondary | ICD-10-CM | POA: Diagnosis present

## 2019-09-29 DIAGNOSIS — I509 Heart failure, unspecified: Secondary | ICD-10-CM | POA: Insufficient documentation

## 2019-09-29 DIAGNOSIS — R7989 Other specified abnormal findings of blood chemistry: Secondary | ICD-10-CM

## 2019-09-29 DIAGNOSIS — I25810 Atherosclerosis of coronary artery bypass graft(s) without angina pectoris: Secondary | ICD-10-CM | POA: Diagnosis present

## 2019-09-29 DIAGNOSIS — I97638 Postprocedural hematoma of a circulatory system organ or structure following other circulatory system procedure: Secondary | ICD-10-CM | POA: Diagnosis not present

## 2019-09-29 DIAGNOSIS — D631 Anemia in chronic kidney disease: Secondary | ICD-10-CM | POA: Insufficient documentation

## 2019-09-29 DIAGNOSIS — I132 Hypertensive heart and chronic kidney disease with heart failure and with stage 5 chronic kidney disease, or end stage renal disease: Secondary | ICD-10-CM | POA: Diagnosis present

## 2019-09-29 DIAGNOSIS — Z85528 Personal history of other malignant neoplasm of kidney: Secondary | ICD-10-CM

## 2019-09-29 DIAGNOSIS — M109 Gout, unspecified: Secondary | ICD-10-CM | POA: Diagnosis present

## 2019-09-29 DIAGNOSIS — Z7901 Long term (current) use of anticoagulants: Secondary | ICD-10-CM

## 2019-09-29 DIAGNOSIS — Z79899 Other long term (current) drug therapy: Secondary | ICD-10-CM

## 2019-09-29 DIAGNOSIS — Z7902 Long term (current) use of antithrombotics/antiplatelets: Secondary | ICD-10-CM | POA: Insufficient documentation

## 2019-09-29 DIAGNOSIS — I10 Essential (primary) hypertension: Secondary | ICD-10-CM | POA: Diagnosis present

## 2019-09-29 DIAGNOSIS — Z833 Family history of diabetes mellitus: Secondary | ICD-10-CM

## 2019-09-29 DIAGNOSIS — Z8616 Personal history of COVID-19: Secondary | ICD-10-CM

## 2019-09-29 DIAGNOSIS — I739 Peripheral vascular disease, unspecified: Secondary | ICD-10-CM | POA: Diagnosis present

## 2019-09-29 DIAGNOSIS — N186 End stage renal disease: Secondary | ICD-10-CM | POA: Diagnosis present

## 2019-09-29 DIAGNOSIS — E875 Hyperkalemia: Secondary | ICD-10-CM | POA: Diagnosis not present

## 2019-09-29 DIAGNOSIS — Z952 Presence of prosthetic heart valve: Secondary | ICD-10-CM

## 2019-09-29 DIAGNOSIS — Z95820 Peripheral vascular angioplasty status with implants and grafts: Secondary | ICD-10-CM

## 2019-09-29 DIAGNOSIS — S7012XA Contusion of left thigh, initial encounter: Secondary | ICD-10-CM

## 2019-09-29 DIAGNOSIS — R778 Other specified abnormalities of plasma proteins: Secondary | ICD-10-CM

## 2019-09-29 DIAGNOSIS — I214 Non-ST elevation (NSTEMI) myocardial infarction: Secondary | ICD-10-CM

## 2019-09-29 DIAGNOSIS — A419 Sepsis, unspecified organism: Secondary | ICD-10-CM

## 2019-09-29 DIAGNOSIS — I724 Aneurysm of artery of lower extremity: Secondary | ICD-10-CM | POA: Diagnosis present

## 2019-09-29 DIAGNOSIS — T45515A Adverse effect of anticoagulants, initial encounter: Secondary | ICD-10-CM | POA: Diagnosis present

## 2019-09-29 DIAGNOSIS — R791 Abnormal coagulation profile: Secondary | ICD-10-CM | POA: Diagnosis present

## 2019-09-29 DIAGNOSIS — I2489 Other forms of acute ischemic heart disease: Secondary | ICD-10-CM

## 2019-09-29 DIAGNOSIS — Z79891 Long term (current) use of opiate analgesic: Secondary | ICD-10-CM

## 2019-09-29 DIAGNOSIS — I350 Nonrheumatic aortic (valve) stenosis: Secondary | ICD-10-CM | POA: Insufficient documentation

## 2019-09-29 DIAGNOSIS — Z8249 Family history of ischemic heart disease and other diseases of the circulatory system: Secondary | ICD-10-CM

## 2019-09-29 DIAGNOSIS — Z955 Presence of coronary angioplasty implant and graft: Secondary | ICD-10-CM

## 2019-09-29 DIAGNOSIS — D62 Acute posthemorrhagic anemia: Secondary | ICD-10-CM | POA: Diagnosis not present

## 2019-09-29 DIAGNOSIS — S7010XA Contusion of unspecified thigh, initial encounter: Secondary | ICD-10-CM

## 2019-09-29 DIAGNOSIS — I9581 Postprocedural hypotension: Secondary | ICD-10-CM | POA: Diagnosis not present

## 2019-09-29 DIAGNOSIS — I248 Other forms of acute ischemic heart disease: Secondary | ICD-10-CM

## 2019-09-29 DIAGNOSIS — N433 Hydrocele, unspecified: Secondary | ICD-10-CM | POA: Diagnosis present

## 2019-09-29 DIAGNOSIS — I4892 Unspecified atrial flutter: Secondary | ICD-10-CM

## 2019-09-29 DIAGNOSIS — R509 Fever, unspecified: Secondary | ICD-10-CM

## 2019-09-29 DIAGNOSIS — I5022 Chronic systolic (congestive) heart failure: Secondary | ICD-10-CM | POA: Diagnosis present

## 2019-09-29 DIAGNOSIS — I252 Old myocardial infarction: Secondary | ICD-10-CM

## 2019-09-29 DIAGNOSIS — N2581 Secondary hyperparathyroidism of renal origin: Secondary | ICD-10-CM | POA: Diagnosis present

## 2019-09-29 DIAGNOSIS — I953 Hypotension of hemodialysis: Secondary | ICD-10-CM | POA: Diagnosis not present

## 2019-09-29 HISTORY — PX: LOWER EXTREMITY ANGIOGRAPHY: CATH118251

## 2019-09-29 HISTORY — PX: PSEUDOANERYSM COMPRESSION: CATH118259

## 2019-09-29 LAB — CBC
HCT: 25.1 % — ABNORMAL LOW (ref 39.0–52.0)
Hemoglobin: 7.7 g/dL — ABNORMAL LOW (ref 13.0–17.0)
MCH: 34.2 pg — ABNORMAL HIGH (ref 26.0–34.0)
MCHC: 30.7 g/dL (ref 30.0–36.0)
MCV: 111.6 fL — ABNORMAL HIGH (ref 80.0–100.0)
Platelets: 134 10*3/uL — ABNORMAL LOW (ref 150–400)
RBC: 2.25 MIL/uL — ABNORMAL LOW (ref 4.22–5.81)
RDW: 15.7 % — ABNORMAL HIGH (ref 11.5–15.5)
WBC: 9.3 10*3/uL (ref 4.0–10.5)
nRBC: 0 % (ref 0.0–0.2)

## 2019-09-29 LAB — PROTIME-INR
INR: 2.3 — ABNORMAL HIGH (ref 0.8–1.2)
Prothrombin Time: 24.4 seconds — ABNORMAL HIGH (ref 11.4–15.2)

## 2019-09-29 LAB — CBC WITH DIFFERENTIAL/PLATELET
Abs Immature Granulocytes: 0.04 10*3/uL (ref 0.00–0.07)
Basophils Absolute: 0 10*3/uL (ref 0.0–0.1)
Basophils Relative: 0 %
Eosinophils Absolute: 0.1 10*3/uL (ref 0.0–0.5)
Eosinophils Relative: 1 %
HCT: 26.9 % — ABNORMAL LOW (ref 39.0–52.0)
Hemoglobin: 8.4 g/dL — ABNORMAL LOW (ref 13.0–17.0)
Immature Granulocytes: 0 %
Lymphocytes Relative: 15 %
Lymphs Abs: 1.4 10*3/uL (ref 0.7–4.0)
MCH: 34.4 pg — ABNORMAL HIGH (ref 26.0–34.0)
MCHC: 31.2 g/dL (ref 30.0–36.0)
MCV: 110.2 fL — ABNORMAL HIGH (ref 80.0–100.0)
Monocytes Absolute: 0.8 10*3/uL (ref 0.1–1.0)
Monocytes Relative: 8 %
Neutro Abs: 6.8 10*3/uL (ref 1.7–7.7)
Neutrophils Relative %: 76 %
Platelets: 134 10*3/uL — ABNORMAL LOW (ref 150–400)
RBC: 2.44 MIL/uL — ABNORMAL LOW (ref 4.22–5.81)
RDW: 15.7 % — ABNORMAL HIGH (ref 11.5–15.5)
Smear Review: NORMAL
WBC: 9.1 10*3/uL (ref 4.0–10.5)
nRBC: 0 % (ref 0.0–0.2)

## 2019-09-29 LAB — COMPREHENSIVE METABOLIC PANEL
ALT: 16 U/L (ref 0–44)
AST: 18 U/L (ref 15–41)
Albumin: 3.3 g/dL — ABNORMAL LOW (ref 3.5–5.0)
Alkaline Phosphatase: 91 U/L (ref 38–126)
Anion gap: 15 (ref 5–15)
BUN: 35 mg/dL — ABNORMAL HIGH (ref 8–23)
CO2: 27 mmol/L (ref 22–32)
Calcium: 8.9 mg/dL (ref 8.9–10.3)
Chloride: 96 mmol/L — ABNORMAL LOW (ref 98–111)
Creatinine, Ser: 7.49 mg/dL — ABNORMAL HIGH (ref 0.61–1.24)
GFR calc Af Amer: 8 mL/min — ABNORMAL LOW (ref >60)
GFR calc non Af Amer: 7 mL/min — ABNORMAL LOW (ref >60)
Glucose, Bld: 109 mg/dL — ABNORMAL HIGH (ref 70–99)
Potassium: 4.1 mmol/L (ref 3.5–5.1)
Sodium: 138 mmol/L (ref 135–145)
Total Bilirubin: 1.3 mg/dL — ABNORMAL HIGH (ref 0.3–1.2)
Total Protein: 6.2 g/dL — ABNORMAL LOW (ref 6.5–8.1)

## 2019-09-29 SURGERY — LOWER EXTREMITY ANGIOGRAPHY
Anesthesia: Moderate Sedation | Laterality: Left

## 2019-09-29 SURGERY — PSEUDOANERYSM COMPRESSION
Anesthesia: Moderate Sedation

## 2019-09-29 MED ORDER — THROMBIN FOR PERCUTANEOUS TREATMENT OF PSEUDOANEURYSM (5000UNITS/10ML)
PERCUTANEOUS | Status: DC | PRN
  Administered 2019-09-29: 2000 [IU] via PERCUTANEOUS

## 2019-09-29 MED ORDER — DIPHENHYDRAMINE HCL 50 MG/ML IJ SOLN: 50.0000 mg | Freq: Once | INTRAMUSCULAR | Status: DC | PRN

## 2019-09-29 MED ORDER — ONDANSETRON HCL 4 MG/2ML IJ SOLN
4.0000 mg | Freq: Four times a day (QID) | INTRAMUSCULAR | Status: DC | PRN
  Administered 2019-09-29 – 2019-10-01 (×2): 4 mg via INTRAVENOUS
  Filled 2019-09-29: qty 2

## 2019-09-29 MED ORDER — IODIXANOL 320 MG/ML IV SOLN
INTRAVENOUS | Status: DC | PRN
  Administered 2019-09-29: 75 mL via INTRA_ARTERIAL

## 2019-09-29 MED ORDER — ONDANSETRON HCL 4 MG/2ML IJ SOLN: 4.0000 mg | Freq: Four times a day (QID) | INTRAMUSCULAR | Status: DC | PRN

## 2019-09-29 MED ORDER — OXYCODONE HCL 5 MG PO TABS
5.0000 mg | ORAL_TABLET | ORAL | Status: DC | PRN
  Administered 2019-09-30 – 2019-10-01 (×3): 5 mg via ORAL
  Filled 2019-09-29 (×3): qty 1

## 2019-09-29 MED ORDER — MORPHINE SULFATE (PF) 4 MG/ML IV SOLN: 2.0000 mg | INTRAVENOUS | Status: DC | PRN

## 2019-09-29 MED ORDER — SODIUM CHLORIDE 0.9 % IV SOLN: 250.0000 mL | INTRAVENOUS | Status: DC | PRN

## 2019-09-29 MED ORDER — SODIUM CHLORIDE 0.9 % IV SOLN: INTRAVENOUS | Status: DC

## 2019-09-29 MED ORDER — EZETIMIBE 10 MG PO TABS
10.0000 mg | ORAL_TABLET | Freq: Every day | ORAL | Status: DC
  Administered 2019-09-30 – 2019-10-07 (×8): 10 mg via ORAL
  Filled 2019-09-29 (×8): qty 1

## 2019-09-29 MED ORDER — HYDROMORPHONE HCL 1 MG/ML IJ SOLN: 1.0000 mg | Freq: Once | INTRAMUSCULAR | Status: DC | PRN

## 2019-09-29 MED ORDER — MIDAZOLAM HCL 2 MG/2ML IJ SOLN
INTRAMUSCULAR | Status: DC | PRN
  Administered 2019-09-29: 2 mg via INTRAVENOUS

## 2019-09-29 MED ORDER — SEVELAMER CARBONATE 800 MG PO TABS
4000.0000 mg | ORAL_TABLET | Freq: Three times a day (TID) | ORAL | Status: DC
  Administered 2019-09-30 – 2019-10-07 (×17): 4000 mg via ORAL
  Filled 2019-09-29 (×19): qty 5

## 2019-09-29 MED ORDER — MORPHINE SULFATE (PF) 4 MG/ML IV SOLN
4.0000 mg | INTRAVENOUS | Status: DC | PRN
  Administered 2019-09-29 (×2): 4 mg via INTRAVENOUS
  Filled 2019-09-29: qty 1

## 2019-09-29 MED ORDER — CINACALCET HCL 30 MG PO TABS
30.0000 mg | ORAL_TABLET | ORAL | Status: DC
  Administered 2019-09-30 – 2019-10-06 (×4): 30 mg via ORAL
  Filled 2019-09-29 (×4): qty 1

## 2019-09-29 MED ORDER — LIDOCAINE-EPINEPHRINE (PF) 1 %-1:200000 IJ SOLN
INTRAMUSCULAR | Status: DC | PRN
  Administered 2019-09-29: 5 mL

## 2019-09-29 MED ORDER — MIDAZOLAM HCL 2 MG/ML PO SYRP: 8.0000 mg | ORAL_SOLUTION | Freq: Once | ORAL | Status: DC | PRN

## 2019-09-29 MED ORDER — FAMOTIDINE 20 MG PO TABS: 40.0000 mg | ORAL_TABLET | Freq: Once | ORAL | Status: DC | PRN

## 2019-09-29 MED ORDER — OXYCODONE HCL 5 MG PO TABS
5.0000 mg | ORAL_TABLET | ORAL | Status: DC | PRN
Start: 2019-09-29 — End: 2019-09-29

## 2019-09-29 MED ORDER — FENTANYL CITRATE (PF) 100 MCG/2ML IJ SOLN
INTRAMUSCULAR | Status: AC
  Filled 2019-09-29: qty 2

## 2019-09-29 MED ORDER — PANTOPRAZOLE SODIUM 40 MG PO TBEC
40.0000 mg | DELAYED_RELEASE_TABLET | Freq: Every day | ORAL | Status: DC
  Administered 2019-09-29 – 2019-10-07 (×9): 40 mg via ORAL
  Filled 2019-09-29 (×9): qty 1

## 2019-09-29 MED ORDER — HYDROXYCHLOROQUINE SULFATE 200 MG PO TABS
200.0000 mg | ORAL_TABLET | Freq: Two times a day (BID) | ORAL | Status: DC
  Administered 2019-09-29 – 2019-10-07 (×15): 200 mg via ORAL
  Filled 2019-09-29 (×18): qty 1

## 2019-09-29 MED ORDER — METOPROLOL TARTRATE 25 MG PO TABS
25.0000 mg | ORAL_TABLET | Freq: Two times a day (BID) | ORAL | Status: DC
  Administered 2019-10-01 – 2019-10-03 (×5): 25 mg via ORAL
  Filled 2019-09-29 (×6): qty 1

## 2019-09-29 MED ORDER — CEFAZOLIN SODIUM-DEXTROSE 1-4 GM/50ML-% IV SOLN: 1.0000 g | Freq: Once | INTRAVENOUS | Status: DC

## 2019-09-29 MED ORDER — SODIUM CHLORIDE 0.9% FLUSH: 3.0000 mL | INTRAVENOUS | Status: DC | PRN

## 2019-09-29 MED ORDER — FENTANYL CITRATE (PF) 100 MCG/2ML IJ SOLN
INTRAMUSCULAR | Status: DC | PRN
  Administered 2019-09-29: 50 ug via INTRAVENOUS

## 2019-09-29 MED ORDER — ONDANSETRON HCL 4 MG/2ML IJ SOLN
4.0000 mg | Freq: Once | INTRAMUSCULAR | Status: AC
  Administered 2019-09-29: 4 mg via INTRAVENOUS
  Filled 2019-09-29: qty 2

## 2019-09-29 MED ORDER — NITROGLYCERIN 0.4 MG SL SUBL: 0.4000 mg | SUBLINGUAL_TABLET | SUBLINGUAL | Status: DC | PRN

## 2019-09-29 MED ORDER — METHYLPREDNISOLONE SODIUM SUCC 125 MG IJ SOLR: 125.0000 mg | Freq: Once | INTRAMUSCULAR | Status: DC | PRN

## 2019-09-29 MED ORDER — CEFAZOLIN SODIUM-DEXTROSE 1-4 GM/50ML-% IV SOLN
1.0000 g | Freq: Once | INTRAVENOUS | Status: AC
  Administered 2019-09-29: 1 g via INTRAVENOUS
  Filled 2019-09-29: qty 50

## 2019-09-29 MED ORDER — ACETAMINOPHEN 650 MG RE SUPP
650.0000 mg | Freq: Four times a day (QID) | RECTAL | Status: DC | PRN
  Filled 2019-09-29: qty 1

## 2019-09-29 MED ORDER — MIDAZOLAM HCL 5 MG/5ML IJ SOLN
INTRAMUSCULAR | Status: AC
  Filled 2019-09-29: qty 5

## 2019-09-29 MED ORDER — ALLOPURINOL 100 MG PO TABS
300.0000 mg | ORAL_TABLET | Freq: Every day | ORAL | Status: DC
  Administered 2019-09-30 – 2019-10-07 (×8): 300 mg via ORAL
  Filled 2019-09-29 (×4): qty 1
  Filled 2019-09-29 (×5): qty 3

## 2019-09-29 MED ORDER — SODIUM CHLORIDE 0.9% FLUSH
3.0000 mL | Freq: Two times a day (BID) | INTRAVENOUS | Status: DC
  Administered 2019-09-29 – 2019-10-04 (×10): 3 mL via INTRAVENOUS

## 2019-09-29 MED ORDER — ACETAMINOPHEN 325 MG PO TABS
650.0000 mg | ORAL_TABLET | Freq: Four times a day (QID) | ORAL | Status: DC | PRN
  Administered 2019-09-30 – 2019-10-06 (×9): 650 mg via ORAL
  Filled 2019-09-29 (×9): qty 2

## 2019-09-29 MED ORDER — THROMBIN FOR PERCUTANEOUS TREATMENT OF PSEUDOANEURYSM (5000UNITS/10ML)
Freq: Once | PERCUTANEOUS | Status: DC
  Filled 2019-09-29: qty 1

## 2019-09-29 SURGICAL SUPPLY — 22 items
BALLN ULTRV 018 5X60X75 (BALLOONS) ×3
BALLN ULTRV 018 6X60X75 (BALLOONS) ×3
BALLOON ULTRV 018 5X60X75 (BALLOONS) IMPLANT
BALLOON ULTRV 018 6X60X75 (BALLOONS) IMPLANT
CATH BEACON 5 .035 65 KMP TIP (CATHETERS) ×2 IMPLANT
CATH PIG 70CM (CATHETERS) ×2 IMPLANT
DEVICE PRESTO INFLATION (MISCELLANEOUS) ×2 IMPLANT
DEVICE SAFEGUARD 24CM (GAUZE/BANDAGES/DRESSINGS) ×2 IMPLANT
DEVICE STARCLOSE SE CLOSURE (Vascular Products) ×2 IMPLANT
GLIDEWIRE ADV .035X180CM (WIRE) ×2 IMPLANT
PACK ANGIOGRAPHY (CUSTOM PROCEDURE TRAY) ×2 IMPLANT
SHEATH ANL2 6FRX45 HC (SHEATH) ×2 IMPLANT
SHEATH BRITE TIP 5FRX11 (SHEATH) ×2 IMPLANT
SHEATH PINNACLE MP 7F 45CM (SHEATH) ×2 IMPLANT
STENT VIABAHN 6X25X120 (Permanent Stent) ×4 IMPLANT
STENT VIABAHN 6X50X120 (Permanent Stent) ×4 IMPLANT
STENT VIABAHN 6X7.5X120 (Permanent Stent) ×2 IMPLANT
SYR MEDRAD MARK 7 150ML (SYRINGE) ×2 IMPLANT
TUBING CONTRAST HIGH PRESS 72 (TUBING) ×2 IMPLANT
WIRE G 018X200 V18 (WIRE) ×2 IMPLANT
WIRE G V18X300CM (WIRE) ×6 IMPLANT
WIRE J 3MM .035X145CM (WIRE) ×2 IMPLANT

## 2019-09-29 SURGICAL SUPPLY — 2 items
TOWEL OR 17X26 4PK STRL BLUE (TOWEL DISPOSABLE) ×2 IMPLANT
TRAY LACERAT/PLASTIC (MISCELLANEOUS) ×2 IMPLANT

## 2019-09-29 NOTE — ED Provider Notes (Signed)
[facility] Emergency Department Provider Note    First MD Initiated Contact with Patient 09/29/19 1455     (approximate)  I have reviewed the triage vital signs and the nursing notes.   HISTORY  Chief Complaint Post-op Problem    HPI Jeremy [name] is a 64 y.o. male with a significant past medical history as listed below presents to the ER for evaluation of left thigh pain swelling numbness and tingling in his left lower extremity that occurred after he returned home after PCI procedure done with vascular surgery this morning for a pseudoaneurysm.  Shortly thereafter started developing worsening pain.  Currently rates it as moderate.   Blood pressure in triage hypotensive.  Patient denying cp or sob.  He is on coumadin.   Past Medical History:  Diagnosis Date  . Abnormal stress test 03/19/2018  . 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 Aortic stenosis    a. 2014 s/p mech AVR ([facility])-->chronic coumadin; b. 08/2018 Echo: Mild AS/AI. Nl fxn/gradient.  . Arthritis   . Atrial fibrillation ([place])   . CAD (coronary artery disease)    a. 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 02/2018 MV [facility]): large inf, infsept, inflat infarct w/ min rev, EF 30%; c. 06/2018 Cath: LM min irregs, LAD 60p, 95/30/75m, LCX min irregs, RCA 40p, 32m, 90d, RPAV 90, RPDA 95ost; c. 09/2018 CABGx3 (LIMA->LAD, VG->D1, VG->PDA).  . Chronic anticoagulation 06/2012   a. Coumadin in the setting of mech AVR and afib.  Marland Kitchen COVID-19 03/15/2019  . CPAP (continuous positive airway pressure) dependence   . Dialysis patient [facility]) 04/07/2006   Patient started HD around 1996,  had 1st transplant LLQ around 1998 until 2007 when they found renal cell cancer in transplant and both native kidneys, all were removed > went back on HD until 2nd transplant RLQ in 2014 which lasted 3 yrs; it was embolized for suspected acute rejection. Is currently on HD  MWF in [place] w/ [facility] nephrology.  . ESRD (end stage renal disease) ([place])    a. 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 1999 s/p Kidney transplant; c. 2014 Bilat nephrectomy (transplanted kidney resected) in the setting of renal cell carcinoma; d. PD until 11/2017-->HD.  Marland Kitchen Heart murmur   . HFrEF (heart failure with reduced ejection fraction) ([place])    a. 07/2016 Echo: EF 55-60%; b. 02/2018 MV: EF 30%; c. 04/2018 Echo: EF 35%; d. 08/2018 Echo: EF 30-35%. Glob HK w/o rwma. Mildly reduced RV fxn. Mod to sev dil LA. Nl AoV fxn.  Marland Kitchen Hx of colonoscopy    2009 by Dr. Laural Golden. Normal except for small submucosal lipoma. No evidence of polyps or colitis.  . Hyperlipidemia   . Hypertension   . Hypogonadism in male 07/25/2015  . Ischemic cardiomyopathy    a. 07/2016 Echo: EF 55-60%; b. 02/2018 MV: EF 30%; c. 04/2018 Echo: EF 35%; d. 08/2018 Echo: EF 30-35%.  . OSA (obstructive sleep apnea)    No CPAP  . Permanent atrial fibrillation (HCC)    a. CHA2DS2VASc = 3-->Coumadin (also mech AVR); b. 06/2018 Amio started 2/2 elevated rates.  . Renal cell cancer ([place])   . Renal cell carcinoma ([place]) 08/04/2016   Family History  Problem Relation Age of Onset  . Cancer Mother        pancreatic  . Heart disease Father   . Diabetes Father  Past Surgical History:  Procedure Laterality Date  . AORTIC VALVE REPLACEMENT  3 /11 /2014   At [place]    . CAPD INSERTION N/A 02/19/2017   Procedure: LAPAROSCOPIC INSERTION CONTINUOUS AMBULATORY PERITONEAL DIALYSIS  (CAPD) CATHETER;  Surgeon: Algernon Huxley, MD;  Location: [facility] ORS;  Service: General;  Laterality: N/A;  . CORONARY ANGIOPLASTY WITH STENT PLACEMENT    . CORONARY ARTERY BYPASS GRAFT N/A 09/16/2018   Procedure: CORONARY ARTERY BYPASS GRAFT times three      with left internal mammary artery and endoharvest of right leg greater saphenous vein;  Surgeon: Melrose Nakayama, MD;  Location: [place];  Service: Open Heart  Surgery;  Laterality: N/A;  . CORONARY STENT INTERVENTION N/A 07/15/2019   Procedure: CORONARY STENT INTERVENTION;  Surgeon: Wellington Hampshire, MD;  Location: [place] [facility];  Service: Cardiovascular;  Laterality: N/A;  . DG AV DIALYSIS  SHUNT ACCESS EXIST*L* OR     left arm  . EPIGASTRIC HERNIA REPAIR N/A 02/19/2017   Procedure: HERNIA REPAIR EPIGASTRIC ADULT;  Surgeon: Robert Bellow, MD;  Location: [facility] OR[facility];  Service: General;  Laterality: N/A;  . EYE SURGERY     bilateral cataract  . HERNIA REPAIR     UMBILICAL HERNIA  . HERNIA REPAIR  02/19/2017  . INNER EAR SURGERY     20 years ago  . KIDNEY TRANSPLANT    . LEFT HEART CATH AND CORS/GRAFTS ANGIOGRAPHY N/A 07/15/2019   Procedure: LEFT HEART CATH AND CORS/GRAFTS ANGIOGRAPHY;  Surgeon: Wellington Hampshire, MD;  Location: [place] [facility];  Service: Cardiovascular;  Laterality: N/A;  . LOWER EXTREMITY ANGIOGRAPHY Right 08/25/2019   Procedure: LOWER EXTREMITY ANGIOGRAPHY;  Surgeon: Algernon Huxley, MD;  Location: [place] [facility];  Service: Cardiovascular;  Laterality: Right;  . Peritoneal Dialysis access placement  02/19/2017  . PSEUDOANERYSM COMPRESSION N/A 09/29/2019   Procedure: PSEUDOANERYSM COMPRESSION;  Surgeon: Algernon Huxley, MD;  Location: [place] [facility];  Service: Cardiovascular;  Laterality: N/A;  . REMOVAL OF A DIALYSIS CATHETER N/A 11/23/2017   Procedure: REMOVAL OF A PERITONEAL DIALYSIS CATHETER;  Surgeon: Algernon Huxley, MD;  Location: [facility];  Service: Vascular;  Laterality: N/A;  . RIGHT/LEFT HEART CATH AND CORONARY ANGIOGRAPHY Bilateral 06/07/2018   Procedure: RIGHT/LEFT HEART CATH AND CORONARY ANGIOGRAPHY;  Surgeon: Wellington Hampshire, MD;  Location: [place] [facility];  Service: Cardiovascular;  Laterality: Bilateral;  . TEE WITHOUT CARDIOVERSION N/A 07/21/2016   Procedure: TRANSESOPHAGEAL ECHOCARDIOGRAM (TEE);  Surgeon: Wellington Hampshire, MD;  Location: [facility];  Service: Cardiovascular;  Laterality: N/A;  . TEE  WITHOUT CARDIOVERSION N/A 09/16/2018   Procedure: TRANSESOPHAGEAL ECHOCARDIOGRAM (TEE);  Surgeon: Melrose Nakayama, MD;  Location: [place];  Service: Open Heart Surgery;  Laterality: N/A;   Patient Active Problem List   Diagnosis Date Noted  . Unstable angina ([place]) 07/13/2019  . Atherosclerosis of native arteries of the extremities with ulceration ([place]) 07/05/2019  . Foot ulcer ([place]) 05/27/2019  . Skin lesion 11/17/2018  . Complication of vascular access for dialysis 10/01/2018  . S/P CABG x 3 09/16/2018  . Hydrocele 08/16/2018  . IBS (irritable bowel syndrome) 03/19/2018  . Depression 05/17/2017  . AV fistula ([place]) 05/17/2017  . Chronic pain of both knees 05/17/2017  . Arthralgia 05/17/2017  . Osteoarthritis 03/17/2017  . Trigger little finger of right hand 03/17/2017  . Liver disease, unspecified 03/17/2017  . Chronic rejection of kidney  transplant 02/11/2017  . Atrial fibrillation ([place]) 10/30/2016  . Ventral hernia 10/14/2016  . History of renal cell carcinoma 08/04/2016  . Renal transplant failure and rejection 07/31/2016  . Hypogonadism in male 07/25/2015  . 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 Gout 04/24/2015  . Proteinuria 01/03/2015  . Chronic anticoagulation 11/06/2014  . Iron deficiency anemia 11/02/2014  . Epigastric hernia 02/08/2014  . 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[place]) 08/16/2013  . Vitamin D deficiency 08/16/2013  . Spinal stenosis 06/20/2013  . OSA (obstructive sleep apnea)   . Tremor 12/30/2012  . History of kidney transplant 12/15/2012  . Immunosuppressive management encounter following kidney transplant 12/15/2012  . S/P angioplasty with stent 09/07/2012  . H/O mitral valve replacement with mechanical valve 09/07/2012  . Lumbar radiculopathy 09/06/2012  . ESRD (end stage renal disease) ([place]) 07/26/2012  . Hyperlipidemia 07/26/2012  . S/P AVR (aortic valve replacement) 06/23/2012  . Aortic valve disorder 06/14/2012  . Heart failure  ([place]) 05/30/2012  . Thyroid nodule 06/26/2011  . CAD (coronary artery disease) 05/26/2011  . Essential hypertension 05/19/2011      Prior to Admission medications   Medication Sig Start Date End Date Taking? Authorizing Provider  acetaminophen (TYLENOL) 500 MG tablet Take 1,000 mg 2 (two) times daily as needed by mouth for moderate pain.   Yes [provider]  allopurinol (ZYLOPRIM) 300 MG tablet Take 300 mg by mouth daily. 12/23/17  Yes [provider]  cholecalciferol (VITAMIN D3) 25 MCG (1000 UNIT) tablet Take 1,000 Units by mouth daily.   Yes [provider]  cinacalcet (SENSIPAR) 30 MG tablet Take 30 mg by mouth every other day.    Yes [provider]  clopidogrel (PLAVIX) 75 MG tablet Take 1 tablet (75 mg total) by mouth daily with breakfast. 07/29/19  Yes Wellington Hampshire, MD  hydroxychloroquine (PLAQUENIL) 200 MG tablet Take 200 mg by mouth 2 (two) times daily. 09/01/19  Yes [provider]  loratadine (CLARITIN) 10 MG tablet Take 10 mg by mouth daily.   Yes [provider]  metoprolol tartrate (LOPRESSOR) 25 MG tablet Take 1 tablet (25 mg total) by mouth in the morning, at noon, and at bedtime. 07/29/19  Yes Wellington Hampshire, MD  multivitamin (RENA-VIT) TABS tablet Take 1 tablet by mouth daily. 02/23/19  Yes [provider]  nitroGLYCERIN (NITROSTAT) 0.4 MG SL tablet Place 1 tablet (0.4 mg total) under the tongue every 5 (five) minutes as needed for chest pain. 06/24/19 09/29/19 Yes Loel Dubonnet, NP  oxyCODONE-acetaminophen (PERCOCET/ROXICET) 5-325 MG tablet Take 1-2 tablets by mouth every 6 (six) hours as needed for severe pain. 09/21/19  Yes Kris Hartmann, NP  pantoprazole (PROTONIX) 40 MG tablet Take 1 tablet (40 mg total) by mouth daily. 07/29/19  Yes Wellington Hampshire, MD  rosuvastatin (CRESTOR) 40 MG tablet Take 1 tablet (40 mg total) by mouth daily at 6 PM. 07/29/19  Yes Wellington Hampshire, MD  sevelamer carbonate  (RENVELA) 800 MG tablet Take 4,000 mg by mouth 3 (three) times daily with meals. 5 tabs per meal    Yes [provider]  warfarin (COUMADIN) 1 MG tablet Take 1 mg by mouth daily.   Yes [provider]  zinc sulfate 220 (50 Zn) MG capsule Take 220 mg by mouth daily.   Yes [provider]  Epoetin Alfa-epbx (RETACRIT IJ) Epoetin alfa - epbx (Retacrit) 09/19/19 09/17/20  [provider]  ezetimibe (ZETIA) 10 MG tablet Take 1 tablet (  10 mg total) by mouth daily. 04/29/19 09/06/19  Rise Mu, PA-C  lidocaine-prilocaine (EMLA) cream Apply 1 application topically as needed (for fistula). Monday, Wednesday, Friday for dialysis    [provider]    Allergies Statins and Sertraline    Social History Social History   Tobacco Use  . Smoking status: Never Smoker  . Smokeless tobacco: Never Used  Vaping Use  . Vaping Use: Never used  Substance Use Topics  . Alcohol use: No  . Drug use: No    Review of Systems Patient denies headaches, rhinorrhea, blurry vision, numbness, shortness of breath, chest pain, edema, cough, abdominal pain, nausea, vomiting, diarrhea, dysuria, fevers, rashes or hallucinations unless otherwise stated above in HPI. ____________________________________________   PHYSICAL EXAM:  VITAL SIGNS: Vitals:   09/29/19 1535 09/29/19 1614  BP:  (!) 102/58  Pulse: (!) 55 60  Resp: 11 10  Temp:  97.9 F (36.6 C)  SpO2: 100% 100%    Constitutional: Alert and oriented.  Eyes: Conjunctivae are normal.  Head: Atraumatic. Nose: No congestion/rhinnorhea. Mouth/Throat: Mucous membranes are moist.   Neck: No stridor. Painless ROM.  Cardiovascular: Normal rate, regular rhythm. Grossly normal heart sounds.  Good peripheral circulation. Respiratory: Normal respiratory effort.  No retractions. Lungs CTAB. Gastrointestinal: Soft and nontender. No distention. No abdominal bruits. No CVA tenderness. Genitourinary:  deferred Musculoskeletal: Swelling and tenderness to the left anterior thigh and inguinal groin region with some ecchymosis distally.  Left lower extremity is slightly cooler to touch with weak peripheral pulses.  He does have intact motor but feels sensation is decreased below the knee.  Swelling of the left anterior thigh is nonpulsatile..  No joint effusions. Neurologic:  Normal speech and language. No gross focal neurologic deficits are appreciated. No facial droop Skin:  Skin is warm, dry and intact. No rash noted. Psychiatric: Mood and affect are normal. Speech and behavior are normal.  ____________________________________________   LABS (all labs ordered are listed, but only abnormal results are displayed)  Results for orders placed or performed during the hospital encounter of 09/29/19 (from the past 24 hour(s))  CBC with Differential/Platelet     Status: Abnormal   Collection Time: 09/29/19  3:04 PM  Result Value Ref Range   WBC 9.1 4.0 - 10.5 K/uL   RBC 2.44 (L) 4.22 - 5.81 MIL/uL   Hemoglobin 8.4 (L) 13.0 - 17.0 g/dL   HCT 26.9 (L) 39 - 52 %   MCV 110.2 (H) 80.0 - 100.0 fL   MCH 34.4 (H) 26.0 - 34.0 pg   MCHC 31.2 30.0 - 36.0 g/dL   RDW 15.7 (H) 11.5 - 15.5 %   Platelets 134 (L) 150 - 400 K/uL   nRBC 0.0 0.0 - 0.2 %   Neutrophils Relative % 76 %   Neutro Abs 6.8 1.7 - 7.7 K/uL   Lymphocytes Relative 15 %   Lymphs Abs 1.4 0.7 - 4.0 K/uL   Monocytes Relative 8 %   Monocytes Absolute 0.8 0 - 1 K/uL   Eosinophils Relative 1 %   Eosinophils Absolute 0.1 0 - 0 K/uL   Basophils Relative 0 %   Basophils Absolute 0.0 0 - 0 K/uL   WBC Morphology MORPHOLOGY UNREMARKABLE    RBC Morphology MIXED RBC POPULATION    Smear Review Normal platelet morphology    Immature Granulocytes 0 %   Abs Immature Granulocytes 0.04 0.00 - 0.07 K/uL  Comprehensive metabolic panel     Status: Abnormal   Collection  Time: 09/29/19  3:04 PM  Result Value Ref Range   Sodium 138 135 - 145 mmol/L    Potassium 4.1 3.5 - 5.1 mmol/L   Chloride 96 (L) 98 - 111 mmol/L   CO2 27 22 - 32 mmol/L   Glucose, Bld 109 (H) 70 - 99 mg/dL   BUN 35 (H) 8 - 23 mg/dL   Creatinine, Ser 7.49 (H) 0.61 - 1.24 mg/dL   Calcium 8.9 8.9 - 10.3 mg/dL   Total Protein 6.2 (L) 6.5 - 8.1 g/dL   Albumin 3.3 (L) 3.5 - 5.0 g/dL   AST 18 15 - 41 U/L   ALT 16 0 - 44 U/L   Alkaline Phosphatase 91 38 - 126 U/L   Total Bilirubin 1.3 (H) 0.3 - 1.2 mg/dL   GFR calc non Af Amer 7 (L) >60 mL/min   GFR calc Af Amer 8 (L) >60 mL/min   Anion gap 15 5 - 15  Protime-INR     Status: Abnormal   Collection Time: 09/29/19  3:04 PM  Result Value Ref Range   Prothrombin Time 24.4 (H) 11.4 - 15.2 seconds   INR 2.3 (H) 0.8 - 1.2  Type and screen [place]     Status: None   Collection Time: 09/29/19  3:05 PM  Result Value Ref Range   ABO/RH(D) O POS    Antibody Screen NEG    Sample Expiration      10/02/2019,2359 Performed at [facility], [address] [place], [place] [address]    *Note: Due to a large number of results and/or encounters for the requested time period, some results have not been displayed. A complete set of results can be found in Results Review.   ____________________________________________  EKG My review and personal interpretation at Time: 15:25   Indication: hypotension  Rate: 60  Rhythm: sinus Axis: normal Other: rbbb, nonspecific st abn, no stemi ____________________________________________  RADIOLOGY  EMERGENCY DEPARTMENT US SOFT TISSUE INTERPRETATION "Study: Limited Soft Tissue Ultrasound"  INDICATIONS: Pain Multiple views of the body part were obtained in real-time with a multi-frequency linear probe  PERFORMED BY: Myself IMAGES ARCHIVED?: No SIDE:Left BODY PART:Lower extremity INTERPRETATION:  unable to visualize femroal vascular bundle 2/2 overlying hematoma     ____________________________________________   PROCEDURES  Procedure(s)  performed:  .Critical Care Performed by: Merlyn Lot, MD Authorized by: Merlyn Lot, MD   Critical care provider statement:    Critical care time (minutes):  35   Critical care time was exclusive of:  Separately billable procedures and treating other patients   Critical care was necessary to treat or prevent imminent or life-threatening deterioration of the following conditions:  Circulatory failure   Critical care was time spent personally by me on the following activities:  Development of treatment plan with patient or surrogate, discussions with consultants, evaluation of patient's response to treatment, examination of patient, obtaining history from patient or surrogate, ordering and performing treatments and interventions, ordering and review of laboratory studies, ordering and review of radiographic studies, pulse oximetry, re-evaluation of patient's condition and review of old charts      Critical Care performed: yes ____________________________________________   INITIAL IMPRESSION / [place] / ED COURSE  Pertinent labs & imaging results that were available during my care of the patient were reviewed by me and considered in my medical decision making (see chart for details).   DDX: Hemorrhage, embolic limb ischemia, ruptured aneurysm,  Knolan D Cadena is a 64  y.o. who presents to the ED with presentation as described above.  Examination as above.  Patient hypotensive will give IV fluids.  Will check blood work.  Will order type and screen.  At emergently consulted Dr. Delana Meyer of vascular surgery who communicated with Dr. Lucky Cowboy who will will come to evaluate patient intake for emergent angiography.  Clinical Course as of Sep 28 1616  Thu Sep 29, 2019  1540 BP is improving after IV fluids systolics in 10R to low 159Y.  He remains with good mentation.  Blood work does show drop in his hemoglobin to 8.  Patient to take in for angiography and further evaluation  management by vascular surgery.   [PR]    Clinical Course User Index [PR] Merlyn Lot, MD    The patient was evaluated in Emergency Department today for the symptoms described in the history of present illness. He/she was evaluated in the context of the global COVID-19 pandemic, which necessitated consideration that the patient might be at risk for infection with the SARS-CoV-2 virus that causes COVID-19. Institutional protocols and algorithms that pertain to the evaluation of patients at risk for COVID-19 are in a state of rapid change based on information released by regulatory bodies including the CDC and federal and state organizations. These policies and algorithms were followed during the patient's care in the ED.  As part of my medical decision making, I reviewed the following data within the [facility] notes reviewed and incorporated, Labs reviewed, notes from prior ED visits and [place] Controlled Substance Database   ____________________________________________   FINAL CLINICAL IMPRESSION(S) / ED DIAGNOSES  Final diagnoses:  Acute post-operative pain  Hematoma of thigh, initial encounter  Postprocedural hypotension      NEW MEDICATIONS STARTED DURING THIS VISIT:  Current Discharge Medication List       Note:  This document was prepared using Dragon voice recognition software and may include unintentional dictation errors.    Merlyn Lot, MD 09/29/19 [phone]

## 2019-09-29 NOTE — Progress Notes (Signed)
Dr. Lucky Cowboy at bedside speaking with pt. And his girlfriend Langley Gauss re: procedural results. Both LE pulses verified with doppler. Right groin PAD intact without any complications at site. Left groin hematoma noted from left groin "crease" to upper thigh area: 29 in. Circumference x 15 cm width; area firm , tender to touch without ecchymosis or active drainage to site. MD assessed site now post procedure. No acute distress at present.

## 2019-09-29 NOTE — Discharge Instructions (Signed)
Pseudoaneurysm  An aneurysm is a bulge in an artery. A pseudoaneurysm happens when an artery is injured and blood leaks out and forms a sac-like bulge in the surrounding tissues. What are the causes? The most common cause of this condition is a procedure called an angiogram. During this procedure, a small, thin tube (catheter) is inserted into an artery. After an angiogram, the insertion site on the artery should close back up all the way. If it does not, blood may leak out of the artery. Other causes of a pseudoaneurysm include:  Trauma to the walls of an artery, such as from a stabbing injury or a deep cut.  Bypass artery grafting surgery, which is a type of surgery that makes blood flow to the heart better.  An infection that affects the walls of an artery.  A heart attack (myocardial infarction). What are the signs or symptoms? Symptoms of this condition include:  Pain, soreness, or tenderness at the site of the pseudoaneurysm.  Swelling.  Bruising or a change in skin color.  A throbbing mass or lump at the site. How is this diagnosed? This condition may be diagnosed based on:  Your symptoms.  A physical exam.  An imaging test called a Doppler ultrasound. This imaging test uses sound waves to show the blood flow in the arteries and the pseudoaneurysm. How is this treated? This condition may go away on its own without treatment. To help prevent bleeding that cannot be controlled, or to help prevent other problems, your health care provider may suggest one of these treatments:  Injecting a blood-clotting enzyme, such as thrombin, into the site.  Fixing the artery with surgery.  Putting pressure (compression) on the pseudoaneurysm. Follow these instructions at home:  Take over-the-counter and prescription medicines only as told by your health care provider.  Return to your normal activities as told by your health care provider. Ask your health care provider what  activities are safe for you.  Keep all follow-up visits as told by your health care provider. This is important. Contact a health care provider if:  Your pain, soreness, or tenderness at the pseudoaneurysm site keeps getting worse.  You have swelling at the site. Get help right away if:  You have severe or ongoing (persistent) pain at the site of the pseudoaneurysm.  There is bleeding or drainage from the site.  The part of your body where the pseudoaneurysm is located changes color or becomes painful, cold, or numb.  You have chest pain or shortness of breath.  You feel like you might faint or you faint. Summary  A pseudoaneurysm happens when an artery is injured and blood leaks out to form a sac-like bulge.  The most common cause of this condition is a procedure called an angiogram in which a thin tube (catheter) is inserted into an artery.  This condition may go away on its own without treatment.  Take over-the-counter and prescription medicines only as told by your health care provider.  Get help right away if the part of your body where the pseudoaneurysm is located changes color or becomes painful, cold, or numb. This information is not intended to replace advice given to you by your health care provider. Make sure you discuss any questions you have with your health care provider. Document Revised: 12/30/2017 Document Reviewed: 12/30/2017 Elsevier Patient Education  2020 [employer].

## 2019-09-29 NOTE — H&P (Signed)
[facility] History & Physical Update  The patient was interviewed and re-examined.  The patient's previous History and Physical has been reviewed and is unchanged.  There is no change in the plan of care. We plan to proceed with the scheduled procedure.  Leotis Pain, MD  09/29/2019, 8:37 AM

## 2019-09-29 NOTE — Op Note (Signed)
[facility]   OPERATIVE NOTE  DATE: 09/29/2019  PRE-OPERATIVE DIAGNOSIS: Pseudoaneurysm left femoral artery  POST-OPERATIVE DIAGNOSIS: same as above  PROCEDURE: 1.   Thrombin injection left femoral artery pseudoaneurysm with ultrasound guidance  SURGEON: Leotis Pain  ASSISTANT(S): None  ANESTHESIA: Local   ESTIMATED BLOOD LOSS: Minimal  FINDING(S): 1.  Pseudoaneurysm injected with thrombin   INDICATIONS:   JAN OLANO is a 64 y.o. 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  The anatomy appears favorable for a thrombin injection to treat the pseudoaneurysm with a relatively thin long neck.  Risks and benefits were discussed and informed consent was obtained.  DESCRIPTION: After obtaining full informed written consent, the patient was brought back to the operating room and placed supine upon the operating table.  The patient was prepped and draped in the standard fashion.  Ultrasound was used to visualize the left femoral artery and the pseudoaneurysm.  After local anesthesia was performed, ultrasound was used to access the pseudoaneurysm with an 18-gauge needle.  Approximately 2 cc of concentrated thrombin was then injected into the pseudoaneurysm taking care not to enter the arterial circuit.  The needle was removed and pressure was held. At this point, we elected to complete the procedure.  The patient was taken to the recovery room in stable condition.   COMPLICATIONS: None  CONDITION: Stable  Leotis Pain  09/29/2019, 9:56 AM    This note was created with Dragon Medical transcription system. Any errors in dictation are purely unintentional.

## 2019-09-29 NOTE — Progress Notes (Signed)
Pt. Left groin injected with thrombin by Dr. Lucky Cowboy: pt. Tolerated well. Follow up appt. Made for 2 weeks from now. Pt. States "my groin doesn't hurt at all now."

## 2019-09-29 NOTE — Op Note (Signed)
Jeremy [name]  Percutaneous Study/Intervention Procedural Note   Date of Surgery: 09/29/2019  Surgeon(s):Jeremy [name]    Assistants:none  Pre-operative Diagnosis: Pseudoaneurysm left femoral artery after previous right leg intervention, with continued bleeding after thrombin injection  Post-operative diagnosis:  Same  Procedure(s) Performed:             1.  Ultrasound guidance for vascular access right femoral artery             2.  Catheter placement into left superficial femoral and profunda femoris artery             3.  Aortogram and selective left lower extremity angiogram occluding selective images of the profunda and superficial femoral arteries             4.   Covered stent placement to the left superficial femoral artery with 6 mm diameter by 5 and 6 mm diameter by 2.5 cm length Viabahn stents             5.   Covered stent placement to the left profunda femoris artery with 6 mm diameter by 2.5 cm length Viabahn stent  6.  Extension of the stents bilaterally up into the distal common femoral artery with a 6 mm diameter by 7.5 cm length Viabahn stent in the superficial femoral artery and a 6 mm diameter by 5 cm length Viabahn stent in the profunda femoris artery going about 1 to 1-1/2 cm into the common femoral artery distally             7.  StarClose closure device right femoral artery  EBL: 5 cc  Contrast: 75 cc  Fluoro Time: 9.7 minutes  Moderate Conscious Sedation Time: approximately 60 minutes using 2 mg of Versed and 50 mcg of Fentanyl              Indications:  Patient is a 64 y.o.male with a pseudoaneurysm with continued bleeding after thrombin injection enlarging hematoma.  The patient is brought in for angiography for further evaluation and potential treatment.  Due to the limb threatening nature of the situation, angiogram was performed for attempted limb salvage. The patient is aware that if the procedure fails, amputation would be expected.   The patient also understands that even with successful revascularization, amputation may still be required due to the severity of the situation.  Risks and benefits are discussed and informed consent is obtained.   Procedure:  The patient was identified and appropriate procedural time out was performed.  The patient was then placed supine on the table and prepped and draped in the usual sterile fashion. Moderate conscious sedation was administered during a face to face encounter with the patient throughout the procedure with my supervision of the RN administering medicines and monitoring the patient's vital signs, pulse oximetry, telemetry and mental status throughout from the start of the procedure until the patient was taken to the recovery room. Ultrasound was used to evaluate the right common femoral artery.  It was patent but diseased.  A digital ultrasound image was acquired.  A Seldinger needle was used to access the right common femoral artery under direct ultrasound guidance and a permanent image was performed.  A 0.035 J wire was advanced without resistance and a 5Fr sheath was placed.  Pigtail catheter was placed into the aorta and an AP aortogram was performed.  This demonstrated minimal flow in the renal arteries.  Aorta and iliac arteries were calcific but not stenotic. I  then crossed the aortic bifurcation and advanced to the left femoral head. Selective left lower extremity angiogram was then performed. This demonstrated Pseudoaneurysm arising from the bifurcation of the femoral artery with the source being difficult to discern as either the proximal superficial femoral artery or profunda femoris artery.  Kumpe catheter advanced into each vessel and selective imaging was performed of both the superficial femoral artery and profunda femoris artery still making it difficult to discern the location of the pseudoaneurysm and appearing to be at the bifurcation of the common femoral artery. It was felt  that it was in the patient's best interest to proceed with intervention after these images to avoid a second procedure and a larger amount of contrast and fluoroscopy based off of the findings from the initial angiogram. The patient was systemically heparinized and a 6 [place] sheath and subsequently a 7 [place] destination sheath was then placed over the [employer] wire. I then used a Kumpe catheter and the advantage wire to park a 0.018 wire down into the SFA first.  I started by stenting this with a 6 mm diameter by 5 cm length Viabahn stent that came up to the origin of the SFA.  There remained continue extravasation.  I then extended a 6 mm diameter by 2.5 cm length Viabahn stent just into the distal common femoral artery.  These were postdilated with 5 mm balloons but extravasation persisted.  I then used a Kumpe catheter and cannulated the profunda femoris artery again.  A 0.018 wire was placed down keeping a 0.018 wire in the SFA.  A 6 mm diameter by 2.5 cm length Viabahn stent was then deployed to equal the proximal extension of the most proximal SFA stent in the most distal SFA.  This was postdilated with a 5 mm balloon.  Extravasation persisted.  I then felt in a very ill patient who is a terrible surgical candidate due to his comorbidities, extending covered stents up another centimeter to a centimeter and a half into the common femoral artery distally essentially a kissing stent fashion would be prudent.  For the SFA, 6 mm diameter by 7.5 cm length Viabahn stent was deployed about a centimeter and a half up into the common femoral artery.  I then went down into the profunda femoris artery and a 6 mm diameter by 5 cm length Viabahn stent was matched proximally about a centimeter and half into the common femoral artery.  These were postdilated with 6 mm balloons with no further extravasation or blood flow seen in the pseudoaneurysm and brisk flow through both the SFA and profunda femoris  artery.  The remaining native disease in the more proximal common femoral artery and subsequent femoral endarterectomy may be required at some future date. I elected to terminate the procedure. The sheath was removed and StarClose closure device was deployed in the right femoral artery with excellent hemostatic result. The patient was taken to the recovery room in stable condition having tolerated the procedure well.  Findings:               Aortogram:  Minimal flow in the renal arteries.  Aorta and iliac arteries were calcific but not stenotic             Left lower Extremity:  Pseudoaneurysm arising from the bifurcation of the femoral artery with the source being difficult to discern as either the proximal superficial femoral artery or profunda femoris artery.  Kumpe catheter advanced into each vessel and  selective imaging was performed of both the superficial femoral artery and profunda femoris artery still making it difficult to discern the location of the pseudoaneurysm and appearing to be at the bifurcation of the common femoral artery.   Disposition: Patient was taken to the recovery room in stable condition having tolerated the procedure well.  Complications: None  Jeremy [name] 09/29/2019 6:03 PM   This note was created with Dragon Medical transcription system. Any errors in dictation are purely unintentional.

## 2019-09-29 NOTE — Progress Notes (Addendum)
Patient arrived to room 235 via stretcher at 7:10 pm. Alert and oriented and accompanied by girlfriend. Patient moved to bed with slide board; patient not to bend legs. Telemetry reveals NSR without ectopy. Right groin intact with PAD, with device inflated with air. Left groin is swollen, with hematoma that has been marked. Left DP pulse not detectable by doppler and cool to touch. Right DP present with doppler. Secure chat message sent to covering APP. Will CTM.  Update: Left DP pulse now found with doppler and remarked. Foot remains cool to touch but warmer than on previous checks. Patient ate dinner.

## 2019-09-29 NOTE — Progress Notes (Signed)
 [facility] Daily Progress Note  Subjective: 09/29/19: 1.   Thrombin injection left femoral artery pseudoaneurysm with ultrasound guidance  Patient underwent thrombin injection into the left femoral artery pseudoaneurysm this AM.  Patient called the hospital about 2 hours later stating he experienced an acute but constant left thigh pain.  Is also noted some swelling to the area.  He was advised to return to the ED.  Denies any shortness of breath or chest pain.  Objective: Vitals:   09/29/19 1533 09/29/19 1534 09/29/19 1535 09/29/19 1614  BP:    (!) 102/58  Pulse: (!) 56 (!) 56 (!) 55 60  Resp: 17 16 11 10   Temp:    97.9 F (36.6 C)  TempSrc:    Oral  SpO2: 100% 100% 100% 100%  Weight:    83.9 kg  Height:    5\' 10"  (1.778 m)   No intake or output data in the 24 hours ending 09/29/19 1619  Physical Exam: A&Ox3, NAD CV: RRR Pulmonary: CTA Bilaterally Abdomen: Soft, Nontender, Nondistended Vascular:  Left lower extremity: Thigh with swelling.  Approximately mid thigh.  Hematoma noted.  Calf soft.  Extremities warm distally to toes.  Good capillary refill noted to toes.   Laboratory: CBC    Component Value Date/Time   WBC 9.1 09/29/2019 1504   HGB 8.4 (L) 09/29/2019 1504   HGB 10.0 (L) 08/30/2014 1013   HCT 26.9 (L) 09/29/2019 1504   HCT 32.2 (L) 08/30/2014 1013   PLT 134 (L) 09/29/2019 1504   PLT 125 (L) 08/30/2014 1013   BMET    Component Value Date/Time   NA 138 09/29/2019 1504   NA 143 04/14/2019 0000   K 4.1 09/29/2019 1504   CL 96 (L) 09/29/2019 1504   CO2 27 09/29/2019 1504   GLUCOSE 109 (H) 09/29/2019 1504   BUN 35 (H) 09/29/2019 1504   BUN 20 04/14/2019 0000   CREATININE 7.49 (H) 09/29/2019 1504   CALCIUM 8.9 09/29/2019 1504   CALCIUM 9.2 12/14/2015 1059   GFRNONAA 7 (L) 09/29/2019 1504   GFRAA 8 (L) 09/29/2019 1504   Assessment/Planning: The patient is a 64 year old male status post thrombin injection right femoral artery  pseudoaneurysm now with left thigh hematoma.  1) Left thigh hematoma:  Patient with left thigh hematoma status post left femoral artery pseudoaneurysm thrombin injection.  Recommend returning to the angiography suite undergoing a left lower extremity angiogram and attempt to assess patient's anatomy and if appropriate prosthetic place stent hemostasis.  Procedure, risks and benefits explained to the patient.  All questions answered.  Patient wished to proceed.  Discussed with Dr. Ellis Parents Zebbie Ace PA-C 09/29/2019 4:19 PM

## 2019-09-29 NOTE — ED Triage Notes (Signed)
Pt comes into the Ed via EMS from home with c/o swelling to the left thigh after having an injection vascular for aneurysm Dr. Lucky Cowboy  This am, was discharged around 1130-12pm and states he immediately started having pain to that leg walking to the car and currently has severe pain and swelling to the upper thigh and groin area.

## 2019-09-30 ENCOUNTER — Encounter: Payer: Self-pay | Admitting: Vascular Surgery

## 2019-09-30 DIAGNOSIS — S7010XA Contusion of unspecified thigh, initial encounter: Secondary | ICD-10-CM | POA: Diagnosis not present

## 2019-09-30 DIAGNOSIS — I729 Aneurysm of unspecified site: Secondary | ICD-10-CM

## 2019-09-30 DIAGNOSIS — A419 Sepsis, unspecified organism: Secondary | ICD-10-CM | POA: Diagnosis not present

## 2019-09-30 DIAGNOSIS — I9581 Postprocedural hypotension: Secondary | ICD-10-CM

## 2019-09-30 DIAGNOSIS — G8918 Other acute postprocedural pain: Secondary | ICD-10-CM

## 2019-09-30 LAB — PHOSPHORUS: Phosphorus: 4 mg/dL (ref 2.5–4.6)

## 2019-09-30 LAB — PROTIME-INR
INR: 2.7 — ABNORMAL HIGH (ref 0.8–1.2)
Prothrombin Time: 28.1 seconds — ABNORMAL HIGH (ref 11.4–15.2)

## 2019-09-30 LAB — CBC
HCT: 21.5 % — ABNORMAL LOW (ref 39.0–52.0)
Hemoglobin: 6.9 g/dL — ABNORMAL LOW (ref 13.0–17.0)
MCH: 34.3 pg — ABNORMAL HIGH (ref 26.0–34.0)
MCHC: 32.1 g/dL (ref 30.0–36.0)
MCV: 107 fL — ABNORMAL HIGH (ref 80.0–100.0)
Platelets: 125 10*3/uL — ABNORMAL LOW (ref 150–400)
RBC: 2.01 MIL/uL — ABNORMAL LOW (ref 4.22–5.81)
RDW: 15.9 % — ABNORMAL HIGH (ref 11.5–15.5)
WBC: 8.6 10*3/uL (ref 4.0–10.5)
nRBC: 0.2 % (ref 0.0–0.2)

## 2019-09-30 LAB — BASIC METABOLIC PANEL
Anion gap: 13 (ref 5–15)
BUN: 49 mg/dL — ABNORMAL HIGH (ref 8–23)
CO2: 27 mmol/L (ref 22–32)
Calcium: 8.5 mg/dL — ABNORMAL LOW (ref 8.9–10.3)
Chloride: 99 mmol/L (ref 98–111)
Creatinine, Ser: 8.46 mg/dL — ABNORMAL HIGH (ref 0.61–1.24)
GFR calc Af Amer: 7 mL/min — ABNORMAL LOW (ref >60)
GFR calc non Af Amer: 6 mL/min — ABNORMAL LOW (ref >60)
Glucose, Bld: 110 mg/dL — ABNORMAL HIGH (ref 70–99)
Potassium: 5.1 mmol/L (ref 3.5–5.1)
Sodium: 139 mmol/L (ref 135–145)

## 2019-09-30 LAB — HEPATITIS B SURFACE ANTIGEN: Hepatitis B Surface Ag: NONREACTIVE

## 2019-09-30 LAB — HIV ANTIBODY (ROUTINE TESTING W REFLEX): HIV Screen 4th Generation wRfx: NONREACTIVE

## 2019-09-30 LAB — MAGNESIUM: Magnesium: 2.2 mg/dL (ref 1.7–2.4)

## 2019-09-30 LAB — PREPARE RBC (CROSSMATCH)

## 2019-09-30 MED ORDER — SODIUM CHLORIDE 0.9 % IV SOLN: 100.0000 mL | INTRAVENOUS | Status: DC | PRN

## 2019-09-30 MED ORDER — ALTEPLASE 2 MG IJ SOLR: 2.0000 mg | Freq: Once | INTRAMUSCULAR | Status: DC | PRN

## 2019-09-30 MED ORDER — LIDOCAINE-PRILOCAINE 2.5-2.5 % EX CREA
1.0000 "application " | TOPICAL_CREAM | CUTANEOUS | Status: DC | PRN
  Filled 2019-09-30: qty 5

## 2019-09-30 MED ORDER — HEPARIN SODIUM (PORCINE) 1000 UNIT/ML DIALYSIS
1000.0000 [IU] | INTRAMUSCULAR | Status: DC | PRN
  Filled 2019-09-30: qty 1

## 2019-09-30 MED ORDER — CHLORHEXIDINE GLUCONATE CLOTH 2 % EX PADS
6.0000 | MEDICATED_PAD | Freq: Every day | CUTANEOUS | Status: DC
  Administered 2019-10-01 – 2019-10-04 (×3): 6 via TOPICAL

## 2019-09-30 MED ORDER — PENTAFLUOROPROP-TETRAFLUOROETH EX AERO
1.0000 "application " | INHALATION_SPRAY | CUTANEOUS | Status: DC | PRN
  Filled 2019-09-30: qty 30

## 2019-09-30 MED ORDER — LIDOCAINE HCL (PF) 1 % IJ SOLN
5.0000 mL | INTRAMUSCULAR | Status: DC | PRN
  Filled 2019-09-30: qty 5

## 2019-09-30 NOTE — Progress Notes (Signed)
Pre HD Assessment    09/30/19 2016  Neurological  Level of Consciousness Alert  Orientation Level Oriented X4  Respiratory  Respiratory Pattern Regular;Unlabored  Chest Assessment Chest expansion symmetrical  Bilateral Breath Sounds Clear;Diminished  Cardiac  Pulse Regular  Heart Sounds S1, S2  ECG Monitor Yes  Cardiac Rhythm NSR  Integumentary  Integumentary (WDL) X  Psychosocial  Psychosocial (WDL) WDL

## 2019-09-30 NOTE — Progress Notes (Signed)
HD Initiated    09/30/19 2019  Vital Signs  Temp 99.7 F (37.6 C)  Temp Source Oral  Pulse Rate 91  Pulse Rate Source Dinamap  Resp 18  BP (!) 96/55  BP Location Right Arm  BP Method Automatic  Patient Position (if appropriate) Lying  Oxygen Therapy  SpO2 97 %  O2 Device Room Air  During Hemodialysis Assessment  Blood Flow Rate (mL/min) 400 mL/min  Arterial Pressure (mmHg) -190 mmHg  Venous Pressure (mmHg) 130 mmHg  Transmembrane Pressure (mmHg) 60 mmHg  Ultrafiltration Rate (mL/min) 0 mL/min  Dialysate Flow Rate (mL/min) 500 ml/min  Conductivity: Machine  14  HD Safety Checks Performed Yes  Dialysis Fluid Bolus Normal Saline  Bolus Amount (mL) 250 mL  Intra-Hemodialysis Comments Tx initiated

## 2019-09-30 NOTE — Progress Notes (Signed)
Pre HD     09/30/19 2015  Vital Signs  Temp 99.7 F (37.6 C)  Temp Source Oral  Pulse Rate 87  Pulse Rate Source Dinamap  Resp 11  BP (!) 96/55  BP Location Right Arm  BP Method Automatic  Patient Position (if appropriate) Lying  Oxygen Therapy  SpO2 99 %  O2 Device Room Air  Pain Assessment  Pain Scale 0-10  Pain Score 0  Dialysis Weight  Weight  (unable to weigh pt prior to tx start)  Type of Weight Pre-Dialysis  Time-Out for Hemodialysis  What Procedure? HD   Pt Identifiers(min of two) First/Last Name;MRN/Account#;Pt's DOB(use if MRN/Acct# not available  Correct Site? Yes  Correct Side? Yes  Correct Procedure? Yes  Consents Verified? Yes  Rad Studies Available? N/A  Safety Precautions Reviewed? Yes  Engineer, civil (consulting) Number 8  Station Number 2 [id number])  UF/Alarm Test Passed  Conductivity: Meter 14  Conductivity: Machine  14  pH 7.2  Reverse Osmosis 4  Normal Saline Lot Number [id number]  Dialyzer Lot Number [id number]  Disposable Set Lot Number [id number]  Machine Temperature 98.6 F (37 C)  Musician and Audible Yes  Blood Lines Intact and Secured Yes  Pre Treatment Patient Checks  Vascular access used during treatment Fistula  Patient is receiving dialysis in a chair  (no)  Hepatitis B Surface Antigen Results Negative  Date Hepatitis B Surface Antigen Drawn 09/30/19  Isolation Initiated  (no)  Hemodialysis Consent Verified Yes  Hemodialysis Standing Orders Initiated Yes  ECG (Telemetry) Monitor On Yes  Prime Ordered Normal Saline  Length of  DialysisTreatment -hour(s) 3.5 Hour(s)  Dialyzer Elisio 17H NR  Dialysate 2K;2.5 Ca  Dialysate Flow Ordered 800  Blood Flow Rate Ordered 400 mL/min  Ultrafiltration Goal 1.5 Liters  Dialysis Blood Pressure Support Ordered Normal Saline  Education / Care Plan  Dialysis Education Provided Yes  Documented Education in Care Plan Yes  Outpatient Plan of Care Reviewed and on Chart Yes  Fistula / Graft  Left Forearm Arteriovenous fistula  No Placement Date or Time found.   Placed prior to admission: Yes  Orientation: Left  Access Location: Forearm  Access Type: Arteriovenous fistula  Site Condition No complications  Fistula / Graft Assessment Bruit;Thrill;Present  Status Accessed  Needle Size 15  Drainage Description Sanguineous (small area of bleeding, occlusive dsg applied)

## 2019-09-30 NOTE — Progress Notes (Signed)
Blood Consent form signed and placed on chart.

## 2019-09-30 NOTE — Plan of Care (Signed)

## 2019-09-30 NOTE — Progress Notes (Signed)
[facility]  ROUNDING NOTE   Subjective:  Patient known to [name] from prior admissions. Follows with [facility] nephrology as an outpatient. Came in with left femoral artery pseudoaneurysm status post repair with covered stents. Due for hemodialysis today.   Objective:  Vital signs in last 24 hours:  Temp:  [97.3 F (36.3 C)-100 F (37.8 C)] 100 F (37.8 C) (06/25 1200) Pulse Rate:  [47-91] 91 (06/25 1200) Resp:  [5-19] 15 (06/25 1200) BP: (84-138)/(47-62) 101/50 (06/25 1200) SpO2:  [97 %-100 %] 100 % (06/25 1200) Weight:  [83.9 kg-84.4 kg] 83.9 kg (06/24 1614)  Weight change:  Filed Weights   09/29/19 1446 09/29/19 1614  Weight: 84.4 kg 83.9 kg    Intake/Output: I/O last 3 completed shifts: In: 220 [P.O.:220] Out: -    Intake/Output this shift:  No intake/output data recorded.  Physical Exam: General: No acute distress  Head: Normocephalic, atraumatic. Moist oral mucosal membranes  Eyes: Anicteric  Neck: Supple, trachea midline  Lungs:  Clear to auscultation, normal effort  Heart: S1S2 no rubs  Abdomen:  Soft, nontender, bowel sounds present  Extremities: Trace peripheral edema.  Neurologic: Awake, alert, following commands  Skin: No lesions  Access: Left upper extremity AV fistula    Basic Metabolic Panel: Recent Labs  Lab 09/29/19 1504 09/30/19 0547  NA 138 139  K 4.1 5.1  CL 96* 99  CO2 27 27  GLUCOSE 109* 110*  BUN 35* 49*  CREATININE 7.49* 8.46*  CALCIUM 8.9 8.5*  MG  --  2.2    Liver Function Tests: Recent Labs  Lab 09/29/19 1504  AST 18  ALT 16  ALKPHOS 91  BILITOT 1.3*  PROT 6.2*  ALBUMIN 3.3*   No results for input(s): LIPASE, AMYLASE in the last 168 hours. No results for input(s): AMMONIA in the last 168 hours.  CBC: Recent Labs  Lab 09/29/19 1504 09/29/19 1936 09/30/19 0547  WBC 9.1 9.3 8.6  NEUTROABS 6.8  --   --   HGB 8.4* 7.7* 6.9*  HCT 26.9* 25.1* 21.5*  MCV 110.2* 111.6* 107.0*  PLT 134* 134* 125*     Cardiac Enzymes: No results for input(s): CKTOTAL, CKMB, CKMBINDEX, TROPONINI in the last 168 hours.  BNP: Invalid input(s): POCBNP  CBG: No results for input(s): GLUCAP in the last 168 hours.  Microbiology: Results for orders placed or performed during the hospital encounter of 09/27/19  SARS CORONAVIRUS 2 (TAT 6-24 HRS) Nasopharyngeal Nasopharyngeal Swab     Status: None   Collection Time: 09/27/19 12:31 PM   Specimen: Nasopharyngeal Swab  Result Value Ref Range Status   SARS Coronavirus 2 NEGATIVE NEGATIVE Final    Comment: (NOTE) SARS-CoV-2 target nucleic acids are NOT DETECTED.  The SARS-CoV-2 RNA is generally detectable in upper and lower respiratory specimens during the acute phase of infection. Negative results do not preclude SARS-CoV-2 infection, do not rule out co-infections with other pathogens, and should not be used as the sole basis for treatment or other patient management decisions. Negative results must be combined with clinical observations, patient history, and epidemiological information. The expected result is Negative.  Fact Sheet for Patients: SugarRoll.be  Fact Sheet for Healthcare Providers: https://www.woods-mathews.com/  This test is not yet approved or cleared by the [place] FDA and  has been authorized for detection and/or diagnosis of SARS-CoV-2 by FDA under an Emergency Use Authorization (EUA). This EUA will remain  in effect (meaning this test can be used) for the duration of the COVID-19  declaration under Se ction 564(b)(1) of the Act, 21 U.S.C. section 360bbb-3(b)(1), unless the authorization is terminated or revoked sooner.  Performed at [facility], [place] [address] [place], [place] [address]    *Note: Due to a large number of results and/or encounters for the requested time period, some results have not been displayed. A complete set of results can be found in Results Review.     Coagulation Studies: Recent Labs    09/29/19 1504 09/30/19 0547  LABPROT 24.4* 28.1*  INR 2.3* 2.7*    Urinalysis: No results for input(s): COLORURINE, LABSPEC, PHURINE, GLUCOSEU, HGBUR, BILIRUBINUR, KETONESUR, PROTEINUR, UROBILINOGEN, NITRITE, LEUKOCYTESUR in the last 72 hours.  Invalid input(s): APPERANCEUR    Imaging: CARDIAC CATHETERIZATION  Result Date: 09/29/2019 See op note  PERIPHERAL VASCULAR CATHETERIZATION  Result Date: 09/29/2019 See op note    Medications:   . sodium chloride     . allopurinol  300 mg Oral Daily  . [START ON 10/01/2019] Chlorhexidine Gluconate Cloth  6 each Topical Q0600  . cinacalcet  30 mg Oral QODAY  . ezetimibe  10 mg Oral Daily  . hydroxychloroquine  200 mg Oral BID  . metoprolol tartrate  25 mg Oral BID  . pantoprazole  40 mg Oral Daily  . sevelamer carbonate  4,000 mg Oral TID WC  . sodium chloride flush  3 mL Intravenous Q12H   sodium chloride, acetaminophen **OR** acetaminophen, morphine injection, nitroGLYCERIN, ondansetron (ZOFRAN) IV, oxyCODONE, sodium chloride flush  Assessment/ Plan:  64 y.o. 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  ESRD on HD MWF.  Patient due for hemodialysis today.  Orders have been prepared.  Ultrafiltration target 1.5 kg.  2.  Anemia of chronic kidney disease with anemia of blood loss.  Hemoglobin down to 6.9 post procedure.  Agree with plans for blood transfusion.  3.  Secondary hyperparathyroidism.  Maintain the patient on current doses of Renvela as well as  cinacalcet.  Follow-up serum phosphorus today.  4.  Thanks for consultation.   LOS: 0 Jeremy [name] 6/25/202112:53 PM

## 2019-09-30 NOTE — Progress Notes (Signed)
[facility] Daily Progress Note   Subjective: 09/29/19:  1.   Thrombin injection left femoral artery pseudoaneurysm with ultrasound guidance  With take back to angiography suite for:  1. Ultrasound guidance for vascular access right femoral artery 2. Catheter placement into left superficial femoral and profunda femoris artery 3. Aortogram and selective left lower extremity angiogram occluding selective images of the profunda and superficial femoral arteries 4.  Covered stent placement to the left superficial femoral artery with 6 mm diameter by 5 and 6 mm diameter by 2.5 cm length Viabahn stents 5.  Covered stent placement to the left profunda femoris artery with 6 mm diameter by 2.5 cm length Viabahn stent             6.  Extension of the stents bilaterally up into the distal common femoral artery with a 6 mm diameter by 7.5 cm length Viabahn stent in the superficial femoral artery and a 6 mm diameter by 5 cm length Viabahn stent in the profunda femoris artery going about 1 to 1-1/2 cm into the common femoral artery distally 7. StarClose closure device right femoral artery  Discomfort is improved since procedure.   Objective: Vitals:   09/30/19 0401 09/30/19 0640 09/30/19 0814 09/30/19 1200  BP: (!) 97/52 (!) 103/56 (!) 100/53 (!) 101/50  Pulse: 80 87 91 91  Resp: 18 15 16 15   Temp:   99.5 F (37.5 C) 100 F (37.8 C)  TempSrc:   Oral Oral  SpO2: 100% 100% 100% 100%  Weight:      Height:        Intake/Output Summary (Last 24 hours) at 09/30/2019 1230 Last data filed at 09/30/2019 0400 Gross per 24 hour  Intake 220 ml  Output --  Net 220 ml   Physical Exam: A&Ox3, NAD CV: RRR Pulmonary: CTA Bilaterally Abdomen: Soft, Nontender, Nondistended Vascular:  Left lower extremity: Thigh soft.  Calf soft.  Extremities warm distally to toes.  Good capillary refill.  Hematoma noted to  mid thigh.  Motor / sensory is intact.   Laboratory: CBC    Component Value Date/Time   WBC 8.6 09/30/2019 0547   HGB 6.9 (L) 09/30/2019 0547   HGB 10.0 (L) 08/30/2014 1013   HCT 21.5 (L) 09/30/2019 0547   HCT 32.2 (L) 08/30/2014 1013   PLT 125 (L) 09/30/2019 0547   PLT 125 (L) 08/30/2014 1013   BMET    Component Value Date/Time   NA 139 09/30/2019 0547   NA 143 04/14/2019 0000   K 5.1 09/30/2019 0547   CL 99 09/30/2019 0547   CO2 27 09/30/2019 0547   GLUCOSE 110 (H) 09/30/2019 0547   BUN 49 (H) 09/30/2019 0547   BUN 20 04/14/2019 0000   CREATININE 8.46 (H) 09/30/2019 0547   CALCIUM 8.5 (L) 09/30/2019 0547   CALCIUM 9.2 12/14/2015 1059   GFRNONAA 6 (L) 09/30/2019 0547   GFRAA 7 (L) 09/30/2019 0547   Assessment/Planning: The patient is a 64 year old male who presented with left femoral pseudoaneurysm status post thrombin injection status post take back to the angiography suite for hematoma status post stent placement- POD#1  1) Gram and 1/2 drop in hemoglobin. Hbg: 6.9.  No signs of active bleeding noted.  Will transfuse 1 unit packed red blood cells during dialysis.  Posttransfusion CBC. 2) Patient for dialysis this afternoon.  Nephrology has been consulted. 3) Most likely discharge home tomorrow. 4) Restart coumadin tomorrow  Discussed with Dr. Ellis Parents [facility]  PA-C 09/30/2019 12:30 PM

## 2019-10-01 ENCOUNTER — Inpatient Hospital Stay: Payer: Medicare Other

## 2019-10-01 ENCOUNTER — Observation Stay: Payer: Medicare Other

## 2019-10-01 ENCOUNTER — Inpatient Hospital Stay (HOSPITAL_COMMUNITY)
Admit: 2019-10-01 | Discharge: 2019-10-01 | Disposition: A | Discharge status: Home / Self Care | Payer: Medicare Other | Attending: Physician Assistant | Admitting: Physician Assistant

## 2019-10-01 ENCOUNTER — Inpatient Hospital Stay: Admit: 2019-10-01 | Payer: Medicare Other

## 2019-10-01 ENCOUNTER — Encounter: Payer: Self-pay | Admitting: Vascular Surgery

## 2019-10-01 DIAGNOSIS — I480 Paroxysmal atrial fibrillation: Secondary | ICD-10-CM

## 2019-10-01 DIAGNOSIS — G8918 Other acute postprocedural pain: Secondary | ICD-10-CM | POA: Diagnosis not present

## 2019-10-01 DIAGNOSIS — D62 Acute posthemorrhagic anemia: Secondary | ICD-10-CM | POA: Diagnosis not present

## 2019-10-01 DIAGNOSIS — T8612 Kidney transplant failure: Secondary | ICD-10-CM | POA: Diagnosis present

## 2019-10-01 DIAGNOSIS — I371 Nonrheumatic pulmonary valve insufficiency: Secondary | ICD-10-CM | POA: Diagnosis not present

## 2019-10-01 DIAGNOSIS — I2510 Atherosclerotic heart disease of native coronary artery without angina pectoris: Secondary | ICD-10-CM | POA: Diagnosis present

## 2019-10-01 DIAGNOSIS — I724 Aneurysm of artery of lower extremity: Secondary | ICD-10-CM | POA: Diagnosis present

## 2019-10-01 DIAGNOSIS — I9581 Postprocedural hypotension: Secondary | ICD-10-CM

## 2019-10-01 DIAGNOSIS — Z7901 Long term (current) use of anticoagulants: Secondary | ICD-10-CM | POA: Diagnosis not present

## 2019-10-01 DIAGNOSIS — S7010XA Contusion of unspecified thigh, initial encounter: Secondary | ICD-10-CM | POA: Diagnosis not present

## 2019-10-01 DIAGNOSIS — Z952 Presence of prosthetic heart valve: Secondary | ICD-10-CM

## 2019-10-01 DIAGNOSIS — T45515A Adverse effect of anticoagulants, initial encounter: Secondary | ICD-10-CM | POA: Diagnosis present

## 2019-10-01 DIAGNOSIS — N186 End stage renal disease: Secondary | ICD-10-CM | POA: Diagnosis present

## 2019-10-01 DIAGNOSIS — I97638 Postprocedural hematoma of a circulatory system organ or structure following other circulatory system procedure: Secondary | ICD-10-CM | POA: Diagnosis present

## 2019-10-01 DIAGNOSIS — R5082 Postprocedural fever: Secondary | ICD-10-CM | POA: Diagnosis not present

## 2019-10-01 DIAGNOSIS — I483 Typical atrial flutter: Secondary | ICD-10-CM | POA: Diagnosis not present

## 2019-10-01 DIAGNOSIS — I214 Non-ST elevation (NSTEMI) myocardial infarction: Secondary | ICD-10-CM | POA: Diagnosis not present

## 2019-10-01 DIAGNOSIS — I4821 Permanent atrial fibrillation: Secondary | ICD-10-CM

## 2019-10-01 DIAGNOSIS — R791 Abnormal coagulation profile: Secondary | ICD-10-CM | POA: Diagnosis present

## 2019-10-01 DIAGNOSIS — E785 Hyperlipidemia, unspecified: Secondary | ICD-10-CM | POA: Diagnosis present

## 2019-10-01 DIAGNOSIS — I248 Other forms of acute ischemic heart disease: Secondary | ICD-10-CM | POA: Diagnosis not present

## 2019-10-01 DIAGNOSIS — I953 Hypotension of hemodialysis: Secondary | ICD-10-CM | POA: Diagnosis not present

## 2019-10-01 DIAGNOSIS — E875 Hyperkalemia: Secondary | ICD-10-CM | POA: Diagnosis not present

## 2019-10-01 DIAGNOSIS — I132 Hypertensive heart and chronic kidney disease with heart failure and with stage 5 chronic kidney disease, or end stage renal disease: Secondary | ICD-10-CM | POA: Diagnosis present

## 2019-10-01 DIAGNOSIS — Z8616 Personal history of COVID-19: Secondary | ICD-10-CM | POA: Diagnosis not present

## 2019-10-01 DIAGNOSIS — S7012XA Contusion of left thigh, initial encounter: Secondary | ICD-10-CM

## 2019-10-01 DIAGNOSIS — I739 Peripheral vascular disease, unspecified: Secondary | ICD-10-CM | POA: Diagnosis present

## 2019-10-01 DIAGNOSIS — R778 Other specified abnormalities of plasma proteins: Secondary | ICD-10-CM

## 2019-10-01 DIAGNOSIS — D631 Anemia in chronic kidney disease: Secondary | ICD-10-CM | POA: Diagnosis present

## 2019-10-01 DIAGNOSIS — I25118 Atherosclerotic heart disease of native coronary artery with other forms of angina pectoris: Secondary | ICD-10-CM | POA: Diagnosis not present

## 2019-10-01 DIAGNOSIS — I726 Aneurysm of vertebral artery: Secondary | ICD-10-CM | POA: Diagnosis not present

## 2019-10-01 DIAGNOSIS — R5081 Fever presenting with conditions classified elsewhere: Secondary | ICD-10-CM | POA: Diagnosis not present

## 2019-10-01 DIAGNOSIS — I5022 Chronic systolic (congestive) heart failure: Secondary | ICD-10-CM | POA: Diagnosis present

## 2019-10-01 DIAGNOSIS — I729 Aneurysm of unspecified site: Secondary | ICD-10-CM | POA: Diagnosis not present

## 2019-10-01 DIAGNOSIS — I25810 Atherosclerosis of coronary artery bypass graft(s) without angina pectoris: Secondary | ICD-10-CM | POA: Diagnosis present

## 2019-10-01 DIAGNOSIS — I4892 Unspecified atrial flutter: Secondary | ICD-10-CM | POA: Diagnosis not present

## 2019-10-01 DIAGNOSIS — N2581 Secondary hyperparathyroidism of renal origin: Secondary | ICD-10-CM | POA: Diagnosis present

## 2019-10-01 DIAGNOSIS — A419 Sepsis, unspecified organism: Secondary | ICD-10-CM | POA: Diagnosis not present

## 2019-10-01 LAB — CBC WITH DIFFERENTIAL/PLATELET
Abs Immature Granulocytes: 0.04 10*3/uL (ref 0.00–0.07)
Basophils Absolute: 0 10*3/uL (ref 0.0–0.1)
Basophils Relative: 0 %
Eosinophils Absolute: 0 10*3/uL (ref 0.0–0.5)
Eosinophils Relative: 0 %
HCT: 21.5 % — ABNORMAL LOW (ref 39.0–52.0)
Hemoglobin: 7 g/dL — ABNORMAL LOW (ref 13.0–17.0)
Immature Granulocytes: 0 %
Lymphocytes Relative: 11 %
Lymphs Abs: 1 10*3/uL (ref 0.7–4.0)
MCH: 33.8 pg (ref 26.0–34.0)
MCHC: 32.6 g/dL (ref 30.0–36.0)
MCV: 103.9 fL — ABNORMAL HIGH (ref 80.0–100.0)
Monocytes Absolute: 0.6 10*3/uL (ref 0.1–1.0)
Monocytes Relative: 7 %
Neutro Abs: 7.2 10*3/uL (ref 1.7–7.7)
Neutrophils Relative %: 82 %
Platelets: 111 10*3/uL — ABNORMAL LOW (ref 150–400)
RBC: 2.07 MIL/uL — ABNORMAL LOW (ref 4.22–5.81)
RDW: 20 % — ABNORMAL HIGH (ref 11.5–15.5)
WBC: 9 10*3/uL (ref 4.0–10.5)
nRBC: 0 % (ref 0.0–0.2)

## 2019-10-01 LAB — LACTIC ACID, PLASMA
Lactic Acid, Venous: 1.4 mmol/L (ref 0.5–1.9)
Lactic Acid, Venous: 1.7 mmol/L (ref 0.5–1.9)

## 2019-10-01 LAB — PREPARE RBC (CROSSMATCH)

## 2019-10-01 LAB — COMPREHENSIVE METABOLIC PANEL
ALT: 11 U/L (ref 0–44)
AST: 23 U/L (ref 15–41)
Albumin: 2.7 g/dL — ABNORMAL LOW (ref 3.5–5.0)
Alkaline Phosphatase: 71 U/L (ref 38–126)
Anion gap: 11 (ref 5–15)
BUN: 24 mg/dL — ABNORMAL HIGH (ref 8–23)
CO2: 31 mmol/L (ref 22–32)
Calcium: 8 mg/dL — ABNORMAL LOW (ref 8.9–10.3)
Chloride: 98 mmol/L (ref 98–111)
Creatinine, Ser: 5.98 mg/dL — ABNORMAL HIGH (ref 0.61–1.24)
GFR calc Af Amer: 11 mL/min — ABNORMAL LOW (ref >60)
GFR calc non Af Amer: 9 mL/min — ABNORMAL LOW (ref >60)
Glucose, Bld: 97 mg/dL (ref 70–99)
Potassium: 4.2 mmol/L (ref 3.5–5.1)
Sodium: 140 mmol/L (ref 135–145)
Total Bilirubin: 1.1 mg/dL (ref 0.3–1.2)
Total Protein: 5.5 g/dL — ABNORMAL LOW (ref 6.5–8.1)

## 2019-10-01 LAB — GLUCOSE, CAPILLARY: Glucose-Capillary: 84 mg/dL (ref 70–99)

## 2019-10-01 LAB — TROPONIN I (HIGH SENSITIVITY)
Troponin I (High Sensitivity): 495 ng/L (ref <18)
Troponin I (High Sensitivity): 2763 ng/L (ref <18)

## 2019-10-01 LAB — HEMOGLOBIN AND HEMATOCRIT, BLOOD
HCT: 23.4 % — ABNORMAL LOW (ref 39.0–52.0)
HCT: 22.5 % — ABNORMAL LOW (ref 39.0–52.0)
Hemoglobin: 7.9 g/dL — ABNORMAL LOW (ref 13.0–17.0)
Hemoglobin: 7.5 g/dL — ABNORMAL LOW (ref 13.0–17.0)

## 2019-10-01 LAB — MRSA PCR SCREENING: MRSA by PCR: NEGATIVE

## 2019-10-01 LAB — PROTIME-INR
INR: 3.3 — ABNORMAL HIGH (ref 0.8–1.2)
Prothrombin Time: 32.3 seconds — ABNORMAL HIGH (ref 11.4–15.2)

## 2019-10-01 LAB — APTT: aPTT: 57 seconds — ABNORMAL HIGH (ref 24–36)

## 2019-10-01 LAB — HEPATITIS B SURFACE ANTIBODY, QUANTITATIVE: Hep B S AB Quant (Post): 37.2 m[IU]/mL (ref >9.9)

## 2019-10-01 LAB — MAGNESIUM: Magnesium: 1.7 mg/dL (ref 1.7–2.4)

## 2019-10-01 MED ORDER — BISACODYL 5 MG PO TBEC
5.0000 mg | DELAYED_RELEASE_TABLET | Freq: Every day | ORAL | Status: DC | PRN
  Administered 2019-10-01 – 2019-10-02 (×2): 5 mg via ORAL
  Filled 2019-10-01 (×2): qty 1

## 2019-10-01 MED ORDER — METOPROLOL TARTRATE 5 MG/5ML IV SOLN: 2.5000 mg | Freq: Once | INTRAVENOUS | Status: DC

## 2019-10-01 MED ORDER — SODIUM CHLORIDE 0.9 % IV BOLUS
250.0000 mL | Freq: Once | INTRAVENOUS | Status: AC
  Administered 2019-10-01: 250 mL via INTRAVENOUS

## 2019-10-01 MED ORDER — MIDODRINE HCL 5 MG PO TABS
10.0000 mg | ORAL_TABLET | Freq: Once | ORAL | Status: AC
  Administered 2019-10-01: 10 mg via ORAL
  Filled 2019-10-01: qty 2

## 2019-10-01 MED ORDER — METOPROLOL TARTRATE 5 MG/5ML IV SOLN
INTRAVENOUS | Status: AC
  Filled 2019-10-01: qty 5

## 2019-10-01 MED ORDER — SODIUM CHLORIDE 0.9% IV SOLUTION: Freq: Once | INTRAVENOUS | Status: AC

## 2019-10-01 MED ORDER — IOHEXOL 350 MG/ML SOLN
100.0000 mL | Freq: Once | INTRAVENOUS | Status: AC | PRN
  Administered 2019-10-01: 100 mL via INTRAVENOUS

## 2019-10-01 MED ORDER — SIMETHICONE 80 MG PO CHEW
80.0000 mg | CHEWABLE_TABLET | Freq: Four times a day (QID) | ORAL | Status: DC | PRN
  Administered 2019-10-01: 80 mg via ORAL
  Filled 2019-10-01 (×2): qty 1

## 2019-10-01 NOTE — Consult Note (Addendum)
Medical Consultation   Jeremy [name]  WVP:[id number]  DOB: Mar 20, 1956  DOA: 09/29/2019  PCP: Leone Haven, MD   Requesting physician: Dr Lorenso Courier  Reason for consultation: Rapid atrial fibrillation, labile blood pressure   History of Present Illness: Jeremy [name] is an 64 y.o. 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  Within a few hours after thrombin injection, patient developed left thigh hematoma and was rehospitalized later that day for stent placement which he had on 09-29-19.  I was asked to see in consultation due to postoperative episodes of RVR, and hypotension during scheduled dialysis on the night of 09/30/2019 with subsequent development of fever of 103 postdialysis and concern for sepsis.  Blood pressure was as low as 102/33 with heart rate of 133 at the time of consultation with O2 sat 95% on room air.  Patient is awake and alert and is denying complaints.  He denies chest pain, shortness of breath or lightheadedness.  Denies cough, abdominal pain, vomiting or diarrhea.  He does not have worsening of the pain of the left thigh.Repeat blood work significant for hemoglobin of 7 in spite of transfusion of 1 unit packed red blood cells during dialysis the night prior, and troponin of 495.  WBC normal at 9 and lactic acid 1.4   Review of  Systems:  Review of Systems  Constitutional: Positive for fever.  All other systems reviewed and are negative.   Past Medical History: Past Medical History:  Diagnosis Date  . Abnormal stress test 03/19/2018  . 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 Aortic stenosis    a. 2014 s/p mech AVR ([facility])-->chronic coumadin; b. 08/2018 Echo: Mild AS/AI. Nl fxn/gradient.  . Arthritis   . Atrial fibrillation ([place])   . CAD (coronary artery disease)    a. 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 02/2018 MV [facility]): large inf, infsept, inflat infarct w/ min rev, EF 30%; c. 06/2018 Cath: LM min irregs, LAD 60p, 95/30/49m, LCX min irregs, RCA 40p, 49m, 90d, RPAV 90, RPDA 95ost; c. 09/2018 CABGx3 (LIMA->LAD, VG->D1, VG->PDA).  . Chronic anticoagulation 06/2012   a. Coumadin in the setting of mech AVR and afib.  Marland Kitchen COVID-19 03/15/2019  . CPAP (continuous positive airway pressure) dependence   . Dialysis patient [facility]) 04/07/2006   Patient started HD around 1996,  had 1st transplant LLQ around 1998 until 2007 when they found renal cell cancer in transplant and both native kidneys, all were removed > went back on HD until 2nd transplant RLQ in 2014 which lasted 3 yrs; it was embolized for suspected acute rejection. Is currently on HD MWF in [place] w/ [facility] nephrology.  . ESRD (end stage renal disease) ([place])    a. 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 1999  s/p Kidney transplant; c. 2014 Bilat nephrectomy (transplanted kidney resected) in the setting of renal cell carcinoma; d. PD until 11/2017-->HD.  Marland Kitchen Heart murmur   . HFrEF (heart failure with reduced ejection fraction) ([place])    a. 07/2016 Echo: EF 55-60%; b. 02/2018 MV: EF 30%; c. 04/2018 Echo: EF 35%; d. 08/2018 Echo: EF 30-35%. Glob HK w/o rwma. Mildly reduced RV fxn. Mod to sev dil LA. Nl AoV fxn.  Marland Kitchen Hx of colonoscopy    2009 by Dr. Laural Golden. Normal except for small submucosal lipoma. No  evidence of polyps or colitis.  . Hyperlipidemia   . Hypertension   . Hypogonadism in male 07/25/2015  . Ischemic cardiomyopathy    a. 07/2016 Echo: EF 55-60%; b. 02/2018 MV: EF 30%; c. 04/2018 Echo: EF 35%; d. 08/2018 Echo: EF 30-35%.  . OSA (obstructive sleep apnea)    No CPAP  . Permanent atrial fibrillation (HCC)    a. CHA2DS2VASc = 3-->Coumadin (also mech AVR); b. 06/2018 Amio started 2/2 elevated rates.  . Renal cell cancer ([place])   . Renal cell carcinoma ([place]) 08/04/2016    Past Surgical History: Past Surgical History:  Procedure Laterality Date  . AORTIC VALVE REPLACEMENT  3 /11 /2014   At [place]    . CAPD INSERTION N/A 02/19/2017   Procedure: LAPAROSCOPIC INSERTION CONTINUOUS AMBULATORY PERITONEAL DIALYSIS  (CAPD) CATHETER;  Surgeon: Algernon Huxley, MD;  Location: [facility];  Service: General;  Laterality: N/A;  . CORONARY ANGIOPLASTY WITH STENT PLACEMENT    . CORONARY ARTERY BYPASS GRAFT N/A 09/16/2018   Procedure: CORONARY ARTERY BYPASS GRAFT times three      with left internal mammary artery and endoharvest of right leg greater saphenous vein;  Surgeon: Melrose Nakayama, MD;  Location: [place];  Service: Open Heart Surgery;  Laterality: N/A;  . CORONARY STENT INTERVENTION N/A 07/15/2019   Procedure: CORONARY STENT INTERVENTION;  Surgeon: Wellington Hampshire, MD;  Location: [place] [facility];  Service: Cardiovascular;  Laterality: N/A;  . DG AV DIALYSIS  SHUNT ACCESS EXIST*L* OR     left arm  . EPIGASTRIC HERNIA REPAIR N/A 02/19/2017   Procedure: HERNIA REPAIR EPIGASTRIC ADULT;  Surgeon: Robert Bellow, MD;  Location: [facility];  Service: General;  Laterality: N/A;  . EYE SURGERY     bilateral cataract  . HERNIA REPAIR     UMBILICAL HERNIA  . HERNIA REPAIR  02/19/2017  . INNER EAR SURGERY     20 years ago  . KIDNEY TRANSPLANT    . LEFT HEART CATH AND CORS/GRAFTS ANGIOGRAPHY N/A 07/15/2019   Procedure: LEFT HEART CATH AND  CORS/GRAFTS ANGIOGRAPHY;  Surgeon: Wellington Hampshire, MD;  Location: [place] [facility];  Service: Cardiovascular;  Laterality: N/A;  . LOWER EXTREMITY ANGIOGRAPHY Right 08/25/2019   Procedure: LOWER EXTREMITY ANGIOGRAPHY;  Surgeon: Algernon Huxley, MD;  Location: [place] [facility];  Service: Cardiovascular;  Laterality: Right;  . LOWER EXTREMITY ANGIOGRAPHY Left 09/29/2019   Procedure: Lower Extremity Angiography;  Surgeon: Algernon Huxley, MD;  Location: [place] [facility];  Service: Cardiovascular;  Laterality: Left;  . Peritoneal Dialysis access placement  02/19/2017  . PSEUDOANERYSM COMPRESSION N/A 09/29/2019   Procedure: PSEUDOANERYSM COMPRESSION;  Surgeon: Algernon Huxley, MD;  Location: [place] [facility];  Service: Cardiovascular;  Laterality: N/A;  . REMOVAL OF A DIALYSIS CATHETER N/A 11/23/2017   Procedure: REMOVAL OF A PERITONEAL DIALYSIS CATHETER;  Surgeon: Lucky Cowboy,  Erskine Squibb, MD;  Location: [facility];  Service: Vascular;  Laterality: N/A;  . RIGHT/LEFT HEART CATH AND CORONARY ANGIOGRAPHY Bilateral 06/07/2018   Procedure: RIGHT/LEFT HEART CATH AND CORONARY ANGIOGRAPHY;  Surgeon: Wellington Hampshire, MD;  Location: [place] [facility];  Service: Cardiovascular;  Laterality: Bilateral;  . TEE WITHOUT CARDIOVERSION N/A 07/21/2016   Procedure: TRANSESOPHAGEAL ECHOCARDIOGRAM (TEE);  Surgeon: Wellington Hampshire, MD;  Location: [facility];  Service: Cardiovascular;  Laterality: N/A;  . TEE WITHOUT CARDIOVERSION N/A 09/16/2018   Procedure: TRANSESOPHAGEAL ECHOCARDIOGRAM (TEE);  Surgeon: Melrose Nakayama, MD;  Location: [place];  Service: Open Heart Surgery;  Laterality: N/A;     Allergies:   Allergies  Allergen Reactions  . Statins     Other reaction(s): Arthralgias (intolerance), Myalgias (intolerance)  Pt taking pravastatin   . Sertraline Rash     Social History:  reports that he has never smoked. He has never used smokeless tobacco. He reports that he does not drink alcohol and does not use  drugs.   Family History: Family History  Problem Relation Age of Onset  . Cancer Mother        pancreatic  . Heart disease Father   . Diabetes Father      Physical Exam: Vitals:   10/01/19 0223 10/01/19 0230 10/01/19 0245 10/01/19 0315  BP: (!) 111/52 (!) 116/52 (!) 97/51 (!) 102/33  Pulse: (!) 149 (!) 132 (!) 149   Resp: (!) 24 (!) 25 (!) 24 14  Temp:      TempSrc:      SpO2: 94%   95%  Weight:      Height:        Constitutional: Appears somewhat lethargic, mildly diaphoretic  oriented x3, not in any acute distress. Eyes: PERLA, EOMI, irises appear normal, anicteric sclera,  ENMT: external ears and nose appear normal,          Lips appears normal, oropharynx mucosa, tongue, posterior pharynx appear pale Neck: neck appears normal, no masses, normal ROM, no thyromegaly, no JVD  CVS: Tachycardic S1-S2 clear, no murmur rubs or gallops, no LE edema, pedal pulses +1 bilaterally Respiratory:  clear to auscultation bilaterally, no wheezing, rales or rhonchi. Respiratory effort normal. No accessory muscle use.  Abdomen: soft nontender, nondistended, normal bowel sounds, no hepatosplenomegaly, no hernias  Musculoskeletal: : no cyanosis, clubbing or edema noted bilaterally                     diffuse swelling left upper inner thigh with ecchymosis Neuro: Cranial nerves II-XII intact, strength, sensation, reflexes Psych: judgement and insight appear normal, stable mood and affect, mental status Skin: Ecchymosis left upper inner thigh   Data reviewed:  I have personally reviewed following labs and imaging studies Labs:  CBC: Recent Labs  Lab 09/29/19 1504 09/29/19 1936 09/30/19 0547  WBC 9.1 9.3 8.6  NEUTROABS 6.8  --   --   HGB 8.4* 7.7* 6.9*  HCT 26.9* 25.1* 21.5*  MCV 110.2* 111.6* 107.0*  PLT 134* 134* 125*    Basic Metabolic Panel: Recent Labs  Lab 09/29/19 1504 09/30/19 0547 09/30/19 2023  NA 138 139  --   K 4.1 5.1  --   CL 96* 99  --   CO2 27 27  --     GLUCOSE 109* 110*  --   BUN 35* 49*  --   CREATININE 7.49* 8.46*  --   CALCIUM 8.9 8.5*  --   MG  --  2.2  --   PHOS  --   --  4.0   GFR Estimated Creatinine Clearance: 9.3 mL/min (A) (by C-G formula based on SCr of 8.46 mg/dL (H)). Liver Function Tests: Recent Labs  Lab 09/29/19 1504  AST 18  ALT 16  ALKPHOS 91  BILITOT 1.3*  PROT 6.2*  ALBUMIN 3.3*   No results for input(s): LIPASE, AMYLASE in the last 168 hours. No results for input(s): AMMONIA in the last 168 hours. Coagulation profile Recent Labs  Lab 09/26/19 0000 09/29/19 1504 09/30/19 0547  INR 2.6 2.3* 2.7*    Cardiac Enzymes: No results for input(s): CKTOTAL, CKMB, CKMBINDEX, TROPONINI in the last 168 hours. BNP: Invalid input(s): POCBNP CBG: No results for input(s): GLUCAP in the last 168 hours. D-Dimer No results for input(s): DDIMER in the last 72 hours. Hgb A1c No results for input(s): HGBA1C in the last 72 hours. Lipid Profile No results for input(s): CHOL, HDL, LDLCALC, TRIG, CHOLHDL, LDLDIRECT in the last 72 hours. Thyroid function studies No results for input(s): TSH, T4TOTAL, T3FREE, THYROIDAB in the last 72 hours.  Invalid input(s): FREET3 Anemia work up No results for input(s): VITAMINB12, FOLATE, FERRITIN, TIBC, IRON, RETICCTPCT in the last 72 hours. Urinalysis    Component Value Date/Time   COLORURINE AMBER (A) 07/17/2016 1531   APPEARANCEUR CLOUDY (A) 07/17/2016 1531   LABSPEC 1.030 07/17/2016 1531   PHURINE 8.0 07/17/2016 1531   GLUCOSEU 50 (A) 07/17/2016 1531   HGBUR NEGATIVE 07/17/2016 1531   BILIRUBINUR NEGATIVE 07/17/2016 1531   KETONESUR NEGATIVE 07/17/2016 1531   PROTEINUR 100 (A) 07/17/2016 1531   NITRITE NEGATIVE 07/17/2016 1531   LEUKOCYTESUR NEGATIVE 07/17/2016 1531     Microbiology Recent Results (from the past 240 hour(s))  SARS CORONAVIRUS 2 (TAT 6-24 HRS) Nasopharyngeal Nasopharyngeal Swab     Status: None   Collection Time: 09/27/19 12:31 PM   Specimen:  Nasopharyngeal Swab  Result Value Ref Range Status   SARS Coronavirus 2 NEGATIVE NEGATIVE Final    Comment: (NOTE) SARS-CoV-2 target nucleic acids are NOT DETECTED.  The SARS-CoV-2 RNA is generally detectable in upper and lower respiratory specimens during the acute phase of infection. Negative results do not preclude SARS-CoV-2 infection, do not rule out co-infections with other pathogens, and should not be used as the sole basis for treatment or other patient management decisions. Negative results must be combined with clinical observations, patient history, and epidemiological information. The expected result is Negative.  Fact Sheet for Patients: SugarRoll.be  Fact Sheet for Healthcare Providers: https://www.woods-mathews.com/  This test is not yet approved or cleared by the [place] FDA and  has been authorized for detection and/or diagnosis of SARS-CoV-2 by FDA under an Emergency Use Authorization (EUA). This EUA will remain  in effect (meaning this test can be used) for the duration of the COVID-19 declaration under Se ction 564(b)(1) of the Act, 21 U.S.C. section 360bbb-3(b)(1), unless the authorization is terminated or revoked sooner.  Performed at [facility], [place] [address] [place], [place] [address]        Inpatient Medications:   Scheduled Meds: . allopurinol  300 mg Oral Daily  . Chlorhexidine Gluconate Cloth  6 each Topical Q0600  . cinacalcet  30 mg Oral QODAY  . ezetimibe  10 mg Oral Daily  . hydroxychloroquine  200 mg Oral BID  . metoprolol tartrate      . metoprolol tartrate  2.5 mg Intravenous Once  . metoprolol tartrate  25 mg Oral  BID  . pantoprazole  40 mg Oral Daily  . sevelamer carbonate  4,000 mg Oral TID WC  . sodium chloride flush  3 mL Intravenous Q12H   Continuous Infusions: . sodium chloride       Radiological Exams on Admission: CARDIAC CATHETERIZATION  Result Date:  09/29/2019 See op note  PERIPHERAL VASCULAR CATHETERIZATION  Result Date: 09/29/2019 See op note  EKG: Interpretation-sinus tachycardia with right bundle branch block and no acute ST-T wave changes   Impression/Recommendations Principal Problem:  Patient with recent endovascular procedure on left femoral artery complicated by hematoma and acute blood loss requiring blood transfusion presenting with postoperative fever, tachycardia and hypotension    Symptomatic anemia Acute blood loss anemia with hypotension -Hemoglobin 7, practically unchanged following transfusion of 1 unit packed red blood cells during dialysis on 09-30-19 for hemoglobin of 6.9 -Monitor H&H and transfuse another unit if necessary -INR elevated at 3.3 -Continue to hold Coumadin which patient takes for mechanical heart valve  Fever -Uncertain etiology.  May or may not be infectious -Patient developed fever status post stent placement left femoral artery.  Patient had earlier thrombin injection for pseudoaneurysm of the left femoral artery -Patient is tachycardic and hypotensive but not tachypneic.  No leukocytosis and normal lactic acid.  Chest x-ray unremarkable -Symptoms may be related to symptomaticanemia from acute blood loss -Continue to monitor for recurrence of fever -Follow blood cultures -Empiric antibiotic therapy not initiated  Hypotension -Suspect multifactorial and related to acute blood loss and symptomatic anemia plus recent dialysis.  Low suspicion for sepsis -Normal saline bolus 250 mils administered x2 -Hold antihypertensives -Trial of midodrine but with close hemodynamic monitoring  H/O mitral valve replacement with mechanical valve -Patient on Coumadin and INR 3.3 -Coumadin was on hold prior to surgery.  We will continue to hold -Continue to monitor H&H.  Consider FFP if needed  ESRD (end stage renal disease) ([place]) -Received dialysis on 09-30-19, Dr. Holley Raring  Elevated troponin   CAD  (coronary artery disease) with history of CABG -Troponin elevated at 495.  No complaints of chest pain.  No acute ST-T wave changes on EKG -Suspect demand ischemia from symptomatic anemia and recent operative procedure -Patient had a cardiac cath in April 2021 that showed severe two-vessel disease -Consider cardiology consult    Essential hypertension -Hold all antihypertensives due to ongoing hypotension    OSA (obstructive sleep apnea) -CPAP if desired    Atrial fibrillation (HCC) on chronic anticoagulation with Coumadin -Hold rate control agents due to hypotension.  Holding anticoagulation    S/P left femoral artery angioplasty with stent secondary to bleeding following thrombin injection for pseudoaneurysm -Patient presented with    hematoma of thigh, that developed after injection of thrombin into pseudoaneurysm of left femoral artery that developed from prior endovascular procedure -Continued management per vascular   CRITICAL CARE Performed by: Athena Masse   Total critical care time: 90 minutes  Critical care time was exclusive of separately billable procedures and treating other patients.  Critical care was necessary to treat or prevent imminent or life-threatening deterioration.  Critical care was time spent personally by me on the following activities: development of treatment plan with patient and/or surrogate as well as nursing, discussions with consultants, evaluation of patient's response to treatment, examination of patient, obtaining history from patient or surrogate, ordering and performing treatments and interventions, ordering and review of laboratory studies, ordering and review of radiographic studies, pulse oximetry and re-evaluation of patient's condition.   Thank you  for this consultation.  Our [facility] hospitalist team will follow the patient with you.   Time Spent: 76  Athena Masse M.D. [facility] 10/01/2019, 3:44 AM

## 2019-10-01 NOTE — Progress Notes (Signed)
Patient with extensive vascular problems.  Seen by hospitalist early morning hours due to tachycardia and temperature 102 after undergoing 2 subsequent vascular procedures last 2 days. Seen and examined in the morning rounds.  Patient's daughter at the bedside.  Patient himself complaining of swelling and pain on his left thigh and groin.  Denies any chest pain or shortness of breath.  On room air.  Plan: Episode of fever and tachycardia after undergoing vascular manipulation and dialysis.  Currently stabilized.  Blood cultures drawn.  No localized evidence of infection.   Will treat with antibiotics if he has another episode of fever, otherwise will continue to monitor. Left thigh swelling, probable hematoma as per surgery.   Hospitalist team will continue to follow.

## 2019-10-01 NOTE — Progress Notes (Signed)
Patient received 1 unit of RBC's. Upon assessment, pts BP 89/46 with a temp fluctuating from 99 to 101. MD notified. New orders placed to transfer pt to ICU for close monitoring. Report given to emily, pt transferred to ICU 19. Pts daughter and girlfriend aware at bedside.

## 2019-10-01 NOTE — Consult Note (Addendum)
Cardiology Consultation:   Patient ID: Jeremy [name]; [id number]; 1955/06/03   Admit date: 09/29/2019 Date of Consult: 10/01/2019  Primary Care Provider: Leone Haven, MD Primary Cardiologist: [name] Primary Electrophysiologist:  None   Patient Profile:   Jeremy [name] is a 64 y.o. male with a hx of CAD status post three-vessel CABG in 09/2018 followed by subsequent PCI/DES to the SVG to RPDA in 07/2019, HFrEF secondary to ICM, persistent A. 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 Lorenso Courier.  History of Present Illness:   [name] has an extensive cardiac history as outlined above.  He underwent PCI/DES to the LAD in 2013.  He had worsening angina in early 2020 with abnormal nuclear stress test at that time.  Diagnostic LHC showed severe heavily calcified two-vessel CAD of involving the LAD and RCA.  He underwent three-vessel CABG with LIMA to LAD, SVG to diagonal, and SVG to RPDA.  With this, he had symptomatic improvement though postoperatively he started having increased A. fib with RVR burden.  With this, he had recurrent angina with nuclear stress test in 07/2019 which showed a large fixed defect in the inferior and inferolateral wall consistent with an infarct as well as inferior ischemia and an EF of 27%.  In this setting he underwent diagnostic LHC and 07/2019 which showed significant underlying two-vessel CAD with patent LIMA to LAD,  occluded SVG diagonal.  The diagonal was receiving collateral flow from the LAD.  The SVG to RPDA was patent though had a 99% ostial stenosis.  He underwent successful PCI/DES to the SVG to RPDA.  He had a small hematoma after the procedure though did not require intervention.  He was last seen in follow-up on 09/06/2019 noting significant improvement in symptoms.  He was noted to have had a nonhealing ulceration involving the right foot with PAD and underwent lower extremity angiogram by vascular surgery with revascularization of the tibial vessels.  Following this he had a left groin hematoma and was subsequently diagnosed with a pseudoaneurysm.  On 6/24 he underwent thrombin injection of the left femoral artery pseudoaneurysm with ultrasound guidance.  Following this, he presented to the ED later that day with left thigh pain and swelling/numbness in the left lower extremity.  EKG showed sinus bradycardia, 56 bpm, bifascicular block.  He was found to be anemic with an initial hemoglobin of 8.4 trending to 6.9.  He was taken back to the lab by vascular surgery and underwent repeat intervention of the left femoral artery pseudoaneurysm with stenting.  During hemodialysis on 6/25 he developed A. fib with RVR along with fever of 103 F with BP 102/33 and oxygen saturation 95% on room air.  He has subsequently undergone 1 unit of packed red blood cells with current hemoglobin of 7.0.  Blood culture negative x2 today.  Initial high-sensitivity troponin 495 with a delta of 2763.  EKG this morning showed atrial flutter with RVR. He continues to note significant left  groin pain, though denies angina.     Past Medical History:  Diagnosis Date  . Abnormal stress test 03/19/2018  . 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 Aortic stenosis    a. 2014 s/p mech AVR ([facility])-->chronic coumadin; b. 08/2018 Echo: Mild AS/AI. Nl fxn/gradient.  . Arthritis   . Atrial fibrillation ([place])   . CAD (coronary artery disease)     a. 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 02/2018 MV [facility]): large inf, infsept, inflat infarct w/ min rev, EF 30%; c. 06/2018 Cath: LM min irregs, LAD 60p, 95/30/30m, LCX min irregs, RCA 40p, 47m, 90d, RPAV 90, RPDA 95ost; c. 09/2018 CABGx3 (LIMA->LAD, VG->D1, VG->PDA).  . Chronic anticoagulation 06/2012   a. Coumadin in the setting of mech AVR and afib.  Marland Kitchen COVID-19 03/15/2019  . CPAP (continuous positive airway pressure) dependence   . Dialysis patient [facility]) 04/07/2006   Patient started HD around 1996,  had 1st transplant LLQ around 1998 until 2007 when they found renal cell cancer in transplant and both native kidneys, all were removed > went back on HD until 2nd transplant RLQ in 2014 which lasted 3 yrs; it was embolized for suspected acute rejection. Is currently on HD MWF in [place] w/ [facility] nephrology.  . ESRD (end stage renal disease) ([place])    a. 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 1999 s/p Kidney transplant; c. 2014 Bilat nephrectomy (transplanted kidney resected) in the setting of renal cell carcinoma; d. PD until 11/2017-->HD.  Marland Kitchen Heart murmur   . HFrEF (heart failure with reduced ejection fraction) ([place])    a. 07/2016 Echo: EF 55-60%; b. 02/2018 MV: EF 30%; c. 04/2018 Echo: EF 35%; d. 08/2018 Echo: EF 30-35%. Glob HK w/o rwma. Mildly reduced RV fxn. Mod to sev dil LA. Nl AoV fxn.  Marland Kitchen Hx of colonoscopy    2009 by Dr. Laural Golden. Normal except for small submucosal lipoma. No evidence of polyps or colitis.  . Hyperlipidemia   . Hypertension   . Hypogonadism in male 07/25/2015  . Ischemic cardiomyopathy    a. 07/2016 Echo: EF 55-60%; b. 02/2018 MV: EF 30%; c. 04/2018 Echo: EF 35%; d. 08/2018 Echo: EF 30-35%.  . OSA (obstructive sleep apnea)    No CPAP  . Permanent atrial fibrillation (H[facility])    a. CHA2DS2VASc = 3-->Coumadin (also mech AVR); b. 06/2018 [place] started 2/2 elevated rates.  . Renal cell cancer ([place])   . Renal cell  carcinoma ([place]) 08/04/2016    Past Surgical History:  Procedure Laterality Date  . AORTIC VALVE REPLACEMENT  3 /11 /2014   At [place]    . CAPD INSERTION N/A 02/19/2017   Procedure: LAPAROSCOPIC INSERTION CONTINUOUS AMBULATORY PERITONEAL DIALYSIS  (CAPD) CATHETER;  Surgeon: Algernon Huxley, MD;  Location: [facility];  Service: General;  Laterality: N/A;  . CORONARY ANGIOPLASTY WITH STENT PLACEMENT    . CORONARY ARTERY BYPASS GRAFT N/A 09/16/2018   Procedure: CORONARY ARTERY BYPASS GRAFT times three      with left internal mammary artery and endoharvest of right leg greater saphenous vein;  Surgeon: Melrose Nakayama, MD;  Location: [place];  Service: Open Heart Surgery;  Laterality: N/A;  . CORONARY STENT INTERVENTION N/A 07/15/2019   Procedure: CORONARY STENT INTERVENTION;  Surgeon: Wellington Hampshire, MD;  Location: [place] [facility];  Service: Cardiovascular;  Laterality:  N/A;  . DG AV DIALYSIS  SHUNT ACCESS EXIST*L* OR     left arm  . EPIGASTRIC HERNIA REPAIR N/A 02/19/2017   Procedure: HERNIA REPAIR EPIGASTRIC ADULT;  Surgeon: Robert Bellow, MD;  Location: [facility];  Service: General;  Laterality: N/A;  . EYE SURGERY     bilateral cataract  . HERNIA REPAIR     UMBILICAL HERNIA  . HERNIA REPAIR  02/19/2017  . INNER EAR SURGERY     20 years ago  . KIDNEY TRANSPLANT    . LEFT HEART CATH AND CORS/GRAFTS ANGIOGRAPHY N/A 07/15/2019   Procedure: LEFT HEART CATH AND CORS/GRAFTS ANGIOGRAPHY;  Surgeon: Wellington Hampshire, MD;  Location: [place] [facility];  Service: Cardiovascular;  Laterality: N/A;  . LOWER EXTREMITY ANGIOGRAPHY Right 08/25/2019   Procedure: LOWER EXTREMITY ANGIOGRAPHY;  Surgeon: Algernon Huxley, MD;  Location: [place] [facility];  Service: Cardiovascular;  Laterality: Right;  . LOWER EXTREMITY ANGIOGRAPHY Left 09/29/2019   Procedure: Lower Extremity Angiography;  Surgeon: Algernon Huxley, MD;  Location: [place] [facility];  Service:  Cardiovascular;  Laterality: Left;  . Peritoneal Dialysis access placement  02/19/2017  . PSEUDOANERYSM COMPRESSION N/A 09/29/2019   Procedure: PSEUDOANERYSM COMPRESSION;  Surgeon: Algernon Huxley, MD;  Location: [place] [facility];  Service: Cardiovascular;  Laterality: N/A;  . REMOVAL OF A DIALYSIS CATHETER N/A 11/23/2017   Procedure: REMOVAL OF A PERITONEAL DIALYSIS CATHETER;  Surgeon: Algernon Huxley, MD;  Location: [facility];  Service: Vascular;  Laterality: N/A;  . RIGHT/LEFT HEART CATH AND CORONARY ANGIOGRAPHY Bilateral 06/07/2018   Procedure: RIGHT/LEFT HEART CATH AND CORONARY ANGIOGRAPHY;  Surgeon: Wellington Hampshire, MD;  Location: [place] [facility];  Service: Cardiovascular;  Laterality: Bilateral;  . TEE WITHOUT CARDIOVERSION N/A 07/21/2016   Procedure: TRANSESOPHAGEAL ECHOCARDIOGRAM (TEE);  Surgeon: Wellington Hampshire, MD;  Location: [facility];  Service: Cardiovascular;  Laterality: N/A;  . TEE WITHOUT CARDIOVERSION N/A 09/16/2018   Procedure: TRANSESOPHAGEAL ECHOCARDIOGRAM (TEE);  Surgeon: Melrose Nakayama, MD;  Location: [place];  Service: Open Heart Surgery;  Laterality: N/A;     Home Meds: Prior to Admission medications   Medication Sig Start Date End Date Taking? Authorizing Provider  acetaminophen (TYLENOL) 500 MG tablet Take 1,000 mg 2 (two) times daily as needed by mouth for moderate pain.   Yes [provider]  allopurinol (ZYLOPRIM) 300 MG tablet Take 300 mg by mouth daily. 12/23/17  Yes [provider]  cholecalciferol (VITAMIN D3) 25 MCG (1000 UNIT) tablet Take 1,000 Units by mouth daily.   Yes [provider]  cinacalcet (SENSIPAR) 30 MG tablet Take 30 mg by mouth every other day.    Yes [provider]  clopidogrel (PLAVIX) 75 MG tablet Take 1 tablet (75 mg total) by mouth daily with breakfast. 07/29/19  Yes Wellington Hampshire, MD  hydroxychloroquine (PLAQUENIL) 200 MG tablet Take 200 mg by mouth 2 (two) times daily. 09/01/19  Yes [provider]  loratadine (CLARITIN) 10 MG tablet Take 10 mg by mouth daily.   Yes [provider]  metoprolol tartrate (LOPRESSOR) 25 MG tablet Take 1 tablet (25 mg total) by mouth in the morning, at noon, and at bedtime. 07/29/19  Yes Wellington Hampshire, MD  multivitamin (RENA-VIT) TABS tablet Take 1 tablet by mouth daily. 02/23/19  Yes [provider]  nitroGLYCERIN (NITROSTAT) 0.4 MG SL tablet Place 1 tablet (0.4 mg total) under the tongue every 5 (five) minutes as needed for  chest pain. 06/24/19 09/29/19 Yes Loel Dubonnet, NP  oxyCODONE-acetaminophen (PERCOCET/ROXICET) 5-325 MG tablet Take 1-2 tablets by mouth every 6 (six) hours as needed for severe pain. 09/21/19  Yes Kris Hartmann, NP  pantoprazole (PROTONIX) 40 MG tablet Take 1 tablet (40 mg total) by mouth daily. 07/29/19  Yes Wellington Hampshire, MD  rosuvastatin (CRESTOR) 40 MG tablet Take 1 tablet (40 mg total) by mouth daily at 6 PM. 07/29/19  Yes Wellington Hampshire, MD  sevelamer carbonate (RENVELA) 800 MG tablet Take 4,000 mg by mouth 3 (three) times daily with meals. 5 tabs per meal    Yes [provider]  warfarin (COUMADIN) 1 MG tablet Take 1 mg by mouth daily.   Yes [provider]  zinc sulfate 220 (50 Zn) MG capsule Take 220 mg by mouth daily.   Yes [provider]  Epoetin Alfa-epbx (RETACRIT IJ) Epoetin alfa - epbx (Retacrit) 09/19/19 09/17/20  [provider]  ezetimibe (ZETIA) 10 MG tablet Take 1 tablet (10 mg total) by mouth daily. 04/29/19 09/06/19  Rise Mu, PA-C  lidocaine-prilocaine (EMLA) cream Apply 1 application topically as needed (for fistula). Monday, Wednesday, Friday for dialysis    [provider]    Inpatient Medications: Scheduled Meds: . sodium chloride   Intravenous Once  . allopurinol  300 mg Oral Daily  . Chlorhexidine Gluconate Cloth  6 each Topical Q0600  . cinacalcet  30 mg Oral QODAY  . ezetimibe  10 mg Oral Daily  . hydroxychloroquine   200 mg Oral BID  . metoprolol tartrate      . metoprolol tartrate  2.5 mg Intravenous Once  . metoprolol tartrate  25 mg Oral BID  . pantoprazole  40 mg Oral Daily  . sevelamer carbonate  4,000 mg Oral TID WC  . sodium chloride flush  3 mL Intravenous Q12H   Continuous Infusions: . sodium chloride     PRN Meds: sodium chloride, acetaminophen **OR** acetaminophen, morphine injection, nitroGLYCERIN, ondansetron (ZOFRAN) IV, oxyCODONE, simethicone, sodium chloride flush  Allergies:   Allergies  Allergen Reactions  . Statins     Other reaction(s): Arthralgias (intolerance), Myalgias (intolerance)  Pt taking pravastatin   . Sertraline Rash    Social History:   Social History   Socioeconomic History  . Marital status: Divorced    Spouse name: Not on file  . Number of children: 1  . Years of education: Not on file  . Highest education level: High school graduate  Occupational History  . Occupation: retired Forensic scientist     Comment: Psychologist, educational  Tobacco Use  . Smoking status: Never Smoker  . Smokeless tobacco: Never Used  Vaping Use  . Vaping Use: Never used  Substance and Sexual Activity  . Alcohol use: No  . Drug use: No  . Sexual activity: Not Currently    Partners: Female  Other Topics Concern  . Not on file  Social History Narrative   Lives at home with girlfriend   1 daughter 38    Social Determinants of Health   Financial Resource Strain:   . Difficulty of Paying Living Expenses:   Food Insecurity:   . Worried About Charity fundraiser in the Last Year:   . Arboriculturist in the Last Year:   Transportation Needs:   . Film/video editor (Medical):   Marland Kitchen Lack of Transportation (Non-Medical):   Physical Activity:   . Days of Exercise per Week:   .  Minutes of Exercise per Session:   Stress:   . Feeling of Stress :   Social Connections:   . Frequency of Communication with Friends and Family:   . Frequency of Social Gatherings with Friends and Family:     . Attends Religious Services:   . Active Member of Clubs or Organizations:   . Attends Archivist Meetings:   Marland Kitchen Marital Status:   Intimate Partner Violence:   . Fear of Current or Ex-Partner:   . Emotionally Abused:   Marland Kitchen Physically Abused:   . Sexually Abused:      Family History:  Family History  Problem Relation Age of Onset  . Cancer Mother        pancreatic  . Heart disease Father   . Diabetes Father     ROS:  Review of Systems  Constitutional: Positive for chills, fever and malaise/fatigue. Negative for diaphoresis and weight loss.  HENT: Negative for congestion.   Eyes: Negative for discharge and redness.  Respiratory: Negative for cough, hemoptysis, sputum production, shortness of breath and wheezing.   Cardiovascular: Negative for chest pain, palpitations, orthopnea, claudication, leg swelling and PND.  Gastrointestinal: Negative for abdominal pain, blood in stool, heartburn, melena, nausea and vomiting.  Genitourinary: Negative for hematuria.  Musculoskeletal: Positive for joint pain. Negative for falls and myalgias.  Skin: Negative for rash.  Neurological: Positive for weakness. Negative for dizziness, tingling, tremors, sensory change, speech change, focal weakness and loss of consciousness.  Endo/Heme/Allergies: Does not bruise/bleed easily.  Psychiatric/Behavioral: Negative for substance abuse. The patient is not nervous/anxious.   All other systems reviewed and are negative.     Physical Exam/Data:   Vitals:   10/01/19 0700 10/01/19 0800 10/01/19 0900 10/01/19 1000  BP: (!) 118/44 (!) 135/52 (!) 118/52 (!) 113/54  Pulse: (!) 110 (!) 112 (!) 115 (!) 115  Resp: 20 (!) 22 19 16   Temp:      TempSrc:      SpO2: 97% 94% 100% 99%  Weight:      Height:        Intake/Output Summary (Last 24 hours) at 10/01/2019 1053 Last data filed at 10/01/2019 0534 Gross per 24 hour  Intake 490.13 ml  Output -242 ml  Net 732.13 ml   Filed Weights   09/29/19  1446 09/29/19 1614  Weight: 84.4 kg 83.9 kg   Body mass index is 26.54 kg/m.   Physical Exam: General: Well developed, well nourished, in no acute distress. Head: Normocephalic, atraumatic, sclera non-icteric, no xanthomas, nares without discharge.  Neck: Negative for carotid bruits. JVD not elevated. Lungs: Clear bilaterally to auscultation without wheezes, rales, or rhonchi. Breathing is unlabored. Heart: Mildly tachycardic with S1 S2. Mechanical murmurs, no rubs, or gallops appreciated. Abdomen: Soft, non-tender, non-distended with normoactive bowel sounds. No hepatomegaly. No rebound/guarding. No obvious abdominal masses. Msk:  Strength and tone appear normal for age. Extremities: No clubbing or cyanosis. No edema. Distal pedal pulses are 2+ and equal bilaterally. Swelling and ecchymosis noted of the bilateral groin/retroperitoneal area Neuro: Alert and oriented X 3. No facial asymmetry. No focal deficit. Moves all extremities spontaneously. Psych:  Responds to questions appropriately with a normal affect.   EKG:  The EKG was personally reviewed and demonstrates: atrial flutter with RVR, 110 bpm, RBBB Telemetry:  Telemetry was personally reviewed and demonstrates: atrial flutter with RVR, low 100s bpm  Weights: Filed Weights   09/29/19 1446 09/29/19 1614  Weight: 84.4 kg 83.9 kg    Relevant  CV Studies: As above  Laboratory Data:  Chemistry Recent Labs  Lab 09/29/19 1504 09/30/19 0547 10/01/19 0358  NA 138 139 140  K 4.1 5.1 4.2  CL 96* 99 98  CO2 27 27 31   GLUCOSE 109* 110* 97  BUN 35* 49* 24*  CREATININE 7.49* 8.46* 5.98*  CALCIUM 8.9 8.5* 8.0*  GFRNONAA 7* 6* 9*  GFRAA 8* 7* 11*  ANIONGAP 15 13 11     Recent Labs  Lab 09/29/19 1504 10/01/19 0358  PROT 6.2* 5.5*  ALBUMIN 3.3* 2.7*  AST 18 23  ALT 16 11  ALKPHOS 91 71  BILITOT 1.3* 1.1   Hematology Recent Labs  Lab 09/29/19 1936 09/30/19 0547 10/01/19 0358  WBC 9.3 8.6 9.0  RBC 2.25* 2.01* 2.07*   HGB 7.7* 6.9* 7.0*  HCT 25.1* 21.5* 21.5*  MCV 111.6* 107.0* 103.9*  MCH 34.2* 34.3* 33.8  MCHC 30.7 32.1 32.6  RDW 15.7* 15.9* 20.0*  PLT 134* 125* 111*   Cardiac EnzymesNo results for input(s): TROPONINI in the last 168 hours. No results for input(s): TROPIPOC in the last 168 hours.  BNPNo results for input(s): BNP, PROBNP in the last 168 hours.  DDimer No results for input(s): DDIMER in the last 168 hours.  Radiology/Studies:  CARDIAC CATHETERIZATION  Result Date: 09/29/2019 See op note  PERIPHERAL VASCULAR CATHETERIZATION  Result Date: 09/29/2019 See op note  DG Chest Port 1 View  Result Date: 10/01/2019 IMPRESSION: Unchanged mild cardiomegaly without acute airspace disease. Electronically Signed   By: Ulyses Jarred M.D.   On: 10/01/2019 04:15    Assessment and Plan:   1.  CAD involving the native coronary arteries status post CABG status post subsequent PCI to the SVG to RPDA with NSTEMI: -Never with angina -Initial HS-Tn 495 with a delta of 2763, continue to cycle until peak -NSTEMI due to demand ischemia in the setting of severe multivessel native CAD with subsequent disease of the bypass grafts with recent vascular intervention complicated by pseudoaneurysm requiring repair x 2, subsequent anemia requiring pRBC, hypotension, fever -Check echo -Not currently a candidate for heparin gtt with recent pseudoaneurysm/hematoma requiring repair x 2 and anemia requiring pRBC -In the setting of the the above, no plans of [facility] ta tthis time  2.  Persistent A. fib with RVR/atrial flutter: -In the setting of his acute illness/presentation and comorbid conditions  -Exacerbated by the above comorbid conditions and fever -Metoprolol for rate control -Coumadin on hold given anemia/hematoma/pseudoaneurysm, resume when able -As he improves from his acute illness, if he remains in atrial flutter consider DCCV  3.  HFrEF secondary to ICM: -Volume managed by HD -At baseline, and  currently, hypotension precludes GDMT outside of metoprolol   4.  PAD status post recent intervention complicated by pseudoaneurysm status post thrombin injection status post stenting: -Management per vascular surgery   5. Acute anemia on anemia of chronic disease: -Status post pRBC transfusion, now with fever -Cultures no growth to date -Management per IM  5.  Severe AS status post mechanical AVR: -Check echo -Coumadin on hold as above, resume when able  6.  ESRD: -Per nephrology   7.  HTN: -Blood pressure   8.  HLD: -   For questions or updates, please contact [place] Please consult [email or web address] for contact info under Cardiology/STEMI.   Signed, Christell Faith, PA-C [place] Pager: [phone] 10/01/2019, 10:53 AM   Attending Note:   The patient was seen and examined.  Agree with assessment and plan  as noted above.  Changes made to the above note as needed.  Patient seen and independently examined with Christell Faith, PA .   We discussed all aspects of the encounter. I agree with the assessment and plan as stated above.  1.   NSTEMI -suspect that his troponin elevation is due to demand ischemia in the setting of underlying severe coronary artery disease in the setting of hypotension and severe anemia.  He just had a PCI several months ago and had a saphenous vein graft to the right coronary artery stented.  He has underlying native vessel disease that certainly could cause an enzyme elevation under stress such as anemia and hypotension.  He denies any episodes of chest pain prior to his vascular surgery.  He takes medications as directed.  We will get an echocardiogram for further assessment.  I do not anticipate any work-up for this elevated troponin at this time.-Unless the echocardiogram shows significant new wall motion defects.  Continue supportive care.  2.  Atrial flutter: He clinically is in atrial flutter today.  Coumadin is is on hold for now. Will  restart once his pseudoaneurysm/anemia/bleeding is under control.  3.  Status post aortic valve replacement: Coumadin is on hold.  4.  Hypertension: Blood pressures well controlled.   I have spent a total of 40 minutes with patient reviewing hospital  notes , telemetry, EKGs, labs and examining patient as well as establishing an assessment and plan that was discussed with the patient. > 50% of time was spent in direct patient care.   Thayer Headings, Brooke Bonito., MD, [facility] 10/01/2019, 12:46 PM [address]  [place] Pager [phone]

## 2019-10-01 NOTE — Progress Notes (Signed)
HD Complete    09/30/19 2339  Vital Signs  Temp 100.3 F (37.9 C)  Temp Source Oral  Pulse Rate 64  Pulse Rate Source Dinamap  Resp (!) 25  BP (!) 121/56  BP Location Right Leg  BP Method Automatic  Patient Position (if appropriate) Lying  Oxygen Therapy  SpO2 100 %  O2 Device Room Air  Pain Assessment  Pain Scale 0-10  Pain Score 0  During Hemodialysis Assessment  KECN 60.1 KECN  Dialysis Fluid Bolus Normal Saline  Bolus Amount (mL) 250 mL  Intra-Hemodialysis Comments Tx completed

## 2019-10-01 NOTE — Progress Notes (Signed)
2 Days Post-Op   Subjective/Chief Complaint: Aware of all events overnight. Complaints of Gas type chest pain Complains of LEFT thigh pain and swelling  Objective: Vital signs in last 24 hours: Temp:  [98.9 F (37.2 C)-102.8 F (39.3 C)] 100.4 F (38 C) (06/26 1100) Pulse Rate:  [42-156] 112 (06/26 1100) Resp:  [11-27] 13 (06/26 1100) BP: (79-135)/(33-104) 108/54 (06/26 1100) SpO2:  [91 %-100 %] 95 % (06/26 1100) Last BM Date: 09/28/19  Intake/Output from previous day: 06/25 0701 - 06/26 0700 In: 490.1 [P.O.:240; IV Piggyback:250.1] Out: -242  Intake/Output this shift: No intake/output data recorded.  General appearance: alert and mild distress Resp: clear to auscultation bilaterally Cardio: regular rate and rhythm Extremities: Right groin- soft, no hematoma. LEFT lower extremity- warm to toes, Thigh hematoma noted, ecchymosis, mostly soft, medial-firm, mild tenderness  Lab Results:  Recent Labs    09/30/19 0547 10/01/19 0358  WBC 8.6 9.0  HGB 6.9* 7.0*  HCT 21.5* 21.5*  PLT 125* 111*   BMET Recent Labs    09/30/19 0547 10/01/19 0358  NA 139 140  K 5.1 4.2  CL 99 98  CO2 27 31  GLUCOSE 110* 97  BUN 49* 24*  CREATININE 8.46* 5.98*  CALCIUM 8.5* 8.0*   PT/INR Recent Labs    09/30/19 0547 10/01/19 0358  LABPROT 28.1* 32.3*  INR 2.7* 3.3*   ABG No results for input(s): PHART, HCO3 in the last 72 hours.  Invalid input(s): PCO2, PO2  Studies/Results: PERIPHERAL VASCULAR CATHETERIZATION  Result Date: 09/29/2019 See op note  DG Chest Port 1 View  Result Date: 10/01/2019 CLINICAL DATA:  Sepsis EXAM: PORTABLE CHEST 1 VIEW COMPARISON:  03/15/2019 FINDINGS: Remote median sternotomy and CABG. Mild cardiomegaly. Unchanged interstitial opacities. No focal airspace consolidation. IMPRESSION: Unchanged mild cardiomegaly without acute airspace disease. Electronically Signed   By: Ulyses Jarred M.D.   On: 10/01/2019 04:15    Anti-infectives: Anti-infectives  (From admission, onward)   Start     Dose/Rate Route Frequency Ordered Stop   09/30/19 0800  ceFAZolin (ANCEF) IVPB 1 g/50 mL premix       Note to Pharmacy: To be given in specials   1 g 100 mL/hr over 30 Minutes Intravenous  Once 09/29/19 1530 09/30/19 1627   09/29/19 2200  hydroxychloroquine (PLAQUENIL) tablet 200 mg     Discontinue     200 mg Oral 2 times daily 09/29/19 1654        Assessment/Plan: s/p Procedure(s): Lower Extremity Angiography (Left)  Pseudoaneurysm- thrombin injection; CFA, SFA, Profunda Stenting  Bedrest   Appreciate Hospitalist input/care  Elevated troponin >2000. Cardiology Consulted. Appreciate input/care. ECHO HgB 7.0- Will Transfuse 1 Unit pRBC. Duplex obtained of LEFT leg- reviewed at bedside with Tech- no evidence of extravasation/bleeding, stents patent- will monitor closely Febrile- etiology unclear- monitor blood cultures, WBC normal HD per Nephrology Daughter at bedside   LOS: 0 days    Jamesetta So A 10/01/2019

## 2019-10-01 NOTE — Progress Notes (Addendum)
Dr. Damita Dunnings in to see patient on consult at request of Dr. Lorenso Courier. . Received orders, portable CXR, blood cultures, labs drawn. Patient in NAD, no chest pain, no SOB. Discussed decreased BP with Dr. Damita Dunnings. Will give 250 ml NS bolus.  0545 update: Patient received 250 ml NS bolus; second bolus started to infuse over 1 hour. Also received orders to give Midodrine 10 mg x 1.  Last BP 91/49; HR 108, afib.Patient remains alert and oriented. Now complains of increased pain in left groin, 3/10 intensity. Scrotum is also swollen and ecchymotic but pt states he has baseline of swollen scrotum. Left thigh appears with increased swelling to inner aspect of thigh. Will mark area.   Dr. Damita Dunnings updated with recent lab results; elevated troponin, increased INR to 3.3, and  Hgb to 7.0, despite having received 1 unit PRBC's last night in dialysis for Hgb 6.9. Pt states had last dose coumadin on Tuesday, 6/22.   Patient also noted to have apneic episodes while sleeping. Upon discussion with patient, he states he does use CPAP at home. Would like to use it here if possible. O2 sats are stable.

## 2019-10-01 NOTE — Progress Notes (Signed)
A&OX4. VSS. Pt symmetrical in movement of all extremities. Pedal pulses dopplered. Pain to L groin. PRN oxycodone for pain administered. L upper thigh measured at 66 cm circumference. Zofran administered for nausea. Napping intermittently.

## 2019-10-01 NOTE — Consult Note (Signed)
Name: Jeremy [name] MRN: [id number] DOB: [birth year]    ADMISSION DATE:  09/29/2019 CONSULTATION DATE:  09/29/2019  REFERRING MD :  Dr. Lorenso Courier  CHIEF COMPLAINT:  Hypotension  BRIEF PATIENT DESCRIPTION:  64 y.o. Male who on 09/29/19 underwent thrombin injection of pseudoaneurysm of the left femoral artery by Vascular Surgery.  Post procedure developed left thigh hematoma requiring stent placement and hospital admission.  Late in the evening 09/30/19 developed fever, A-fib w/ RVR, and transient hypotension during Hemodialysis. Found to have NSTEMI of which Cardiology is following. Transferred to ICU on 10/01/19 for Transient Hypotension in setting of Acute Blood Loss Anemia due to hematoma of left thigh, along with supratherapeutic INR (pt on chronic coumadin for mechanical valve).  SIGNIFICANT EVENTS  6/24: Thrombin injection of Left femoral artery Pseudoaneurysm 6/24: Post-op developed left thigh hematoma; required stent placement and admission 6/25: Late in evening developed fever, A-fib w/ RVR, & transient hypotension during HD 6/26: Found to have NSTEMI, Cardiology following 6/26: Transfer to ICU due to transient hypotension; PCCM consulted  STUDIES:  6/26: CXR>>Remote median sternotomy and CABG. Mild cardiomegaly. Unchanged interstitial opacities. No focal airspace consolidation. 6/26: Arterial US Left LE>>1. Large hematoma in the proximal left thigh and left groin. No evidence for a pseudoaneurysm. 2. Proximal left femoral arteries and veins are patent. 6/26: CTA Abdomen/Pelvis>> VASCULAR 1. Large, heterogeneous hematoma in the anterior proximal left thigh. No evidence of residual or recurrent pseudoaneurysm in the hematoma. 2. Extensive atheromatous changes with greater than 90% stenosis of the proximal inferior mesenteric artery. 3. Common celiac axis and superior mesenteric artery trunk without stenosis. NON-VASCULAR 1. 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 2. Diffuse bone sclerosis, compatible with renal osteodystrophy. 3. Cardiomegaly. 4. Prominent, gas-filled loops of colon and prominent stool in the right colon without obstruction  CULTURES: SARS-CoV-2 PCR 6/22>> negative Blood culture x2 6/26>> no growth to date MRSA PCR 6/26>> negative  ANTIBIOTICS: Cefazolin 6/25 x1 dose (surgical prophylaxis)  HISTORY OF PRESENT ILLNESS:   Jeremy [name] is a 64 year old male with a past medical history significant for COVID-19 in December 2020, CAD status post CABG in 2020, HFrEF (EF 40 to 45% in February 2021) secondary to ischemic cardiomyopathy, mechanical aortic valve replacement on chronic Coumadin therapy, chronic atrial fibrillation, 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  Postop he developed a left thigh hematoma requiring stent placement and hospital admission.  Late in the evening on  09/30/2019 he developed transient hypotension (BP 102/33), atrial fibrillation with RVR (HR 133), and fever of 103 during hemodialysis. During this time he denied chest pain, shortness of breath, lightheadedness, cough, abdominal pain, vomiting, or diarrhea.  Workup revealed Hemoglobin of 7 in spite of receiving 1 unit packed red blood cells during HD the night prior, along with troponin 495, WBC 9, and lactic acid 1.4. CXR was unremarkable.  He was given a total of 500 cc NS bolus with improvement in BP. His troponin trended up to 2763, and Cardiology was consulted for management of NSTEMI.  On 10/01/19, he received 1 unit pRBCs for a hemoglobin of 7.0.  He again experienced transient hypotension of which he was transferred to ICU with PCCM consultation.  Arterial Ultrasound of LLE and CTA Abdomen/Pelvis show large hematoma to the proximal left thigh, but NO evidence of residual or recurrent  pseudoaneurysm in the hemorrhage was found.  Upon arrival to  ICU, his hypotension has resolved.  PAST MEDICAL HISTORY :   has a past medical history of Abnormal stress test (03/19/2018), Anemia in chronic kidney disease (07/04/2015), Aortic stenosis, Arthritis, Atrial fibrillation ([place]), CAD (coronary artery disease), Chronic anticoagulation (06/2012), COVID-19 (03/15/2019), CPAP (continuous positive airway pressure) dependence, Dialysis patient ([place]) (04/07/2006), ESRD (end stage renal disease) ([place]), Heart murmur, HFrEF (heart failure with reduced ejection fraction) ([place]), colonoscopy, Hyperlipidemia, Hypertension, Hypogonadism in male (07/25/2015), Ischemic cardiomyopathy, OSA (obstructive sleep apnea), Permanent atrial fibrillation ([place]), Renal cell cancer ([place]), and Renal cell carcinoma ([place]) (08/04/2016).  has a past surgical history that includes DG AV DIALYSIS  SHUNT ACCESS EXIST*L* OR; Back surgery; Coronary angioplasty with stent; Kidney transplant; Aortic valve replacement (3 /11 /2014); TEE without cardioversion (N/A, 07/21/2016); Hernia repair; Eye surgery; Inner ear surgery; epigastric hernia repair (N/A, 02/19/2017); CAPD insertion (N/A, 02/19/2017); Hernia repair (02/19/2017); Peritoneal Dialysis access placement (02/19/2017); Removal of a dialysis catheter (N/A, 11/23/2017); RIGHT/LEFT HEART CATH AND CORONARY ANGIOGRAPHY (Bilateral, 06/07/2018); TEE without cardioversion (N/A, 09/16/2018); Coronary artery bypass graft (N/A, 09/16/2018); LEFT HEART CATH AND CORS/GRAFTS ANGIOGRAPHY (N/A, 07/15/2019); CORONARY STENT INTERVENTION (N/A, 07/15/2019); Lower Extremity Angiography (Right, 08/25/2019); PSEUDOANERYSM COMPRESSION (N/A, 09/29/2019); and Lower Extremity Angiography (Left, 09/29/2019). Prior to Admission medications   Medication Sig Start Date End Date Taking? Authorizing Provider  acetaminophen (TYLENOL) 500 MG tablet Take 1,000 mg 2 (two) times daily as needed by mouth for moderate pain.   Yes [provider]  allopurinol (ZYLOPRIM) 300 MG tablet Take 300 mg by mouth daily. 12/23/17  Yes [provider]  cholecalciferol (VITAMIN D3) 25 MCG (1000 UNIT) tablet Take 1,000 Units by mouth daily.   Yes [provider]  cinacalcet (SENSIPAR) 30 MG tablet Take 30 mg by mouth every other day.    Yes [provider]  clopidogrel (PLAVIX) 75 MG tablet Take 1 tablet (75 mg total) by mouth daily with breakfast. 07/29/19  Yes Wellington Hampshire, MD  hydroxychloroquine (PLAQUENIL) 200 MG tablet Take 200 mg by mouth 2 (two) times daily. 09/01/19  Yes [provider]  loratadine (CLARITIN) 10 MG tablet Take 10 mg by mouth daily.   Yes [provider]  metoprolol tartrate (LOPRESSOR) 25 MG tablet Take 1 tablet (25 mg total) by mouth in the morning, at noon, and at bedtime. 07/29/19  Yes Wellington Hampshire, MD  multivitamin (RENA-VIT) TABS tablet Take 1 tablet by mouth daily. 02/23/19  Yes [provider]  nitroGLYCERIN (NITROSTAT) 0.4 MG SL tablet Place 1 tablet (0.4 mg total) under the tongue every 5 (five) minutes as needed for chest pain. 06/24/19 09/29/19 Yes Loel Dubonnet, NP  oxyCODONE-acetaminophen (PERCOCET/ROXICET) 5-325 MG tablet Take 1-2 tablets by mouth every 6 (six) hours as needed for severe pain. 09/21/19  Yes Kris Hartmann, NP  pantoprazole (PROTONIX) 40 MG tablet Take 1 tablet (40 mg total) by mouth daily. 07/29/19  Yes Wellington Hampshire, MD  rosuvastatin (CRESTOR) 40 MG tablet Take 1 tablet (40 mg total) by mouth daily at 6 PM. 07/29/19  Yes Wellington Hampshire, MD  sevelamer carbonate (RENVELA) 800 MG tablet Take 4,000 mg by mouth 3 (three) times daily with meals. 5 tabs per meal    Yes [provider]  warfarin (COUMADIN) 1 MG tablet Take 1 mg by mouth daily.   Yes [provider]  zinc sulfate 220 (50 Zn) MG capsule Take 220 mg by mouth daily.   Yes [provider]  Epoetin Alfa-epbx (RETACRIT IJ) Epoetin  alfa -  epbx (Retacrit) 09/19/19 09/17/20  [provider]  ezetimibe (ZETIA) 10 MG tablet Take 1 tablet (10 mg total) by mouth daily. 04/29/19 09/06/19  Rise Mu, PA-C  lidocaine-prilocaine (EMLA) cream Apply 1 application topically as needed (for fistula). Monday, Wednesday, Friday for dialysis    [provider]   Allergies  Allergen Reactions  . Statins     Other reaction(s): Arthralgias (intolerance), Myalgias (intolerance)  Pt taking pravastatin   . Sertraline Rash    FAMILY HISTORY:  family history includes Cancer in his mother; Diabetes in his father; Heart disease in his father. SOCIAL HISTORY:  reports that he has never smoked. He has never used smokeless tobacco. He reports that he does not drink alcohol and does not use drugs.   COVID-19 DISASTER DECLARATION:  FULL CONTACT PHYSICAL EXAMINATION WAS NOT POSSIBLE DUE TO TREATMENT OF COVID-19 AND  CONSERVATION OF PERSONAL PROTECTIVE EQUIPMENT, LIMITED EXAM FINDINGS INCLUDE-  Patient assessed or the symptoms described in the history of present illness.  In the context of the Global COVID-19 pandemic, which necessitated consideration that the patient might be at risk for infection with the SARS-CoV-2 virus that causes COVID-19, Institutional protocols and algorithms that pertain to the evaluation of patients at risk for COVID-19 are in a state of rapid change based on information released by regulatory bodies including the CDC and federal and state organizations. These policies and algorithms were followed during the patient's care while in hospital.  REVIEW OF SYSTEMS:   Positives in BOLD: Pt currently denies all complaints Constitutional: Negative for fever, chills, weight loss, malaise/fatigue and diaphoresis.  HENT: Negative for hearing loss, ear pain, nosebleeds, congestion, sore throat, neck pain, tinnitus and ear discharge.   Eyes: Negative for blurred vision, double vision, photophobia, pain, discharge and  redness.  Respiratory: Negative for cough, hemoptysis, sputum production, shortness of breath, wheezing and stridor.   Cardiovascular: Negative for chest pain, palpitations, orthopnea, claudication, leg swelling and PND.  Gastrointestinal: Negative for heartburn, nausea, vomiting, abdominal pain, diarrhea, constipation, blood in stool and melena.  Genitourinary: Negative for dysuria, urgency, frequency, hematuria and flank pain.  Musculoskeletal: Negative for myalgias, back pain, joint pain and falls.  Skin: Negative for itching and rash.  Neurological: Negative for dizziness, tingling, tremors, sensory change, speech change, focal weakness, seizures, loss of consciousness, weakness and headaches.  Endo/Heme/Allergies: Negative for environmental allergies and polydipsia. Does not bruise/bleed easily.  SUBJECTIVE:  Pt reports that LLE pain and numbness has much improved Denies chest pain, SOB, cough, fever/chills, N/V/D, dysuria, edema Hypotension has resolved, still with mild tachycardia On CPAP qhs  VITAL SIGNS: Temp:  [98.8 F (37.1 C)-102.8 F (39.3 C)] 98.8 F (37.1 C) (06/26 2000) Pulse Rate:  [42-156] 107 (06/26 2000) Resp:  [0-27] 20 (06/26 2000) BP: (79-135)/(33-104) 112/70 (06/26 2000) SpO2:  [77 %-100 %] 96 % (06/26 2000) Weight:  [87.2 kg] 87.2 kg (06/26 1515)  PHYSICAL EXAMINATION: General: Acutely ill-appearing male, laying in bed, on CPAP nightly, no acute distress Neuro: Sleeping, arouses to voice, alert and oriented x4, follows commands, no focal deficits, speech clear HEENT: Atraumatic, normocephalic, neck supple, no JVD Cardiovascular: Tachycardia, regular rhythm, S1-S2, no murmurs, rubs, gallops Lungs: Clear to auscultation bilaterally, even, nonlabored, normal effort Abdomen: Soft, nontender, nondistended, no guarding or rebound tenderness, bowel sounds positive x4 Musculoskeletal:  Left LE warm to toes, thigh hematoma noted (soft with mild tenderness) Skin:   Warm and dry. No obvious rashes, lesions, or ulcerations  Recent Labs  Lab 09/29/19 1504 09/30/19 0547 10/01/19 0358  NA 138 139 140  K 4.1 5.1 4.2  CL 96* 99 98  CO2 27 27 31   BUN 35* 49* 24*  CREATININE 7.49* 8.46* 5.98*  GLUCOSE 109* 110* 97   Recent Labs  Lab 09/29/19 1936 09/29/19 1936 09/30/19 0547 10/01/19 0358 10/01/19 1724  HGB 7.7*   < > 6.9* 7.0* 7.9*  HCT 25.1*   < > 21.5* 21.5* 23.4*  WBC 9.3  --  8.6 9.0  --   PLT 134*  --  125* 111*  --    < > = values in this interval not displayed.   [name] Lower Ext Art Left Ltd  Result Date: 10/01/2019 CLINICAL DATA:  Recent treatment for left femoral artery pseudoaneurysm. Pseudoaneurysm was treated with covered stents. EXAM: LEFT LOWER EXTREMITY ARTERIAL DUPLEX- LIMITED TECHNIQUE: Gray-scale sonography as well as color Doppler and duplex ultrasound was performed to evaluate the arteries in the left lower extremity. COMPARISON:  None. FINDINGS: Left common femoral artery and vein are patent based on color Doppler images. Evidence for flow in the left superficial femoral artery and left femoral vein. Large heterogeneous hematoma superficial to the left superficial femoral artery. Evidence for flow in the left profunda femoral artery and vein. Heterogeneous hematoma in the left thigh measures 12.1 x 6.4 x 10.4 cm. No evidence for arterial flow within this large hematoma. IMPRESSION: 1. Large hematoma in the proximal left thigh and left groin. No evidence for a pseudoaneurysm. 2. Proximal left femoral arteries and veins are patent. Electronically Signed   By: Markus Daft M.D.   On: 10/01/2019 12:19   DG Chest Port 1 View  Result Date: 10/01/2019 CLINICAL DATA:  Sepsis EXAM: PORTABLE CHEST 1 VIEW COMPARISON:  03/15/2019 FINDINGS: Remote median sternotomy and CABG. Mild cardiomegaly. Unchanged interstitial opacities. No focal airspace consolidation. IMPRESSION: Unchanged mild cardiomegaly without acute airspace disease. Electronically  Signed   By: Ulyses Jarred M.D.   On: 10/01/2019 04:15   CT Angio Abd/Pel w/ and/or w/o  Result Date: 10/01/2019 CLINICAL DATA:  Recently treated left femoral artery pseudoaneurysm. The aneurysm was treated with covered stents. There was a large heterogeneous hematoma in the left thigh on a recent left lower extremity arterial duplex examination, earlier today. There was no evidence of pseudoaneurysm on that examination. EXAM: CTA ABDOMEN AND PELVIS WITHOUT AND WITH CONTRAST TECHNIQUE: Multidetector CT imaging of the abdomen and pelvis was performed using the standard protocol during bolus administration of intravenous contrast. Multiplanar reconstructed images and MIPs were obtained and reviewed to evaluate the vascular anatomy. CONTRAST:  160mL OMNIPAQUE IOHEXOL 350 MG/ML SOLN COMPARISON:  Abdomen and pelvis CT dated 03/02/2019. Limited left lower extremity arterial duplex ultrasound obtained earlier today. FINDINGS: VASCULAR Aorta: Extensive atheromatous arterial calcifications without aneurysm or dissection. Celiac: The celiac axis and superior mesenteric artery arise from a common trunk. The origin has a normal appearance. Peripheral arterial calcifications are noted. SMA: Arises from a common celiac axis and superior mesenteric artery trunk. Small amount of atheromatous changes proximally without significant luminal stenosis. Renals: Dense calcifications proximally. The remainder of the renal arteries are surgically absent, along with the kidneys. IMA: Atheromatous changes proximally with greater than 90% stenosis. Inflow: Extensive atheromatous changes without significant areas of stenosis. Proximal Outflow: Bilateral femoral atheromatous changes. Stents in the left femoral artery. These are patent. No contrast extravasation. Veins: No obvious venous abnormality within the limitations of this arterial phase study. Review of the MIP images confirms the  above findings. NON-VASCULAR Lower chest: Minimal  bibasilar linear atelectasis/scarring. Enlarged heart. Hepatobiliary: Vicariously excreted contrast in the gallbladder. Unremarkable liver. Pancreas: Unremarkable. No pancreatic ductal dilatation or surrounding inflammatory changes. Spleen: Normal in size without focal abnormality. Adrenals/Urinary Tract: Normal appearing adrenal glands. Surgically absent kidneys. 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 Collapsed urinary bladder. Stomach/Bowel: Prominent, gas-filled loops of colon and prominent stool in the right colon with no evidence of obstruction. Unremarkable stomach, small bowel and appendix. Lymphatic: No enlarged lymph nodes. Reproductive: Minimally enlarged prostate gland. Other: Large, heterogeneous hematoma in the anterior proximal left thigh. This measures approximately 13.6 x 12.5 x 8.0 cm. No extravasated contrast in the hematoma. Musculoskeletal: Diffuse bone sclerosis. L5-S1 degenerative changes. IMPRESSION: VASCULAR 1. Large, heterogeneous hematoma in the anterior proximal left thigh. No evidence of residual or recurrent pseudoaneurysm in the hematoma. 2. Extensive atheromatous changes with greater than 90% stenosis of the proximal inferior mesenteric artery. 3. Common celiac axis and superior mesenteric artery trunk without stenosis. NON-VASCULAR 1. 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 2. Diffuse bone sclerosis, compatible with renal osteodystrophy. 3. Cardiomegaly. 4. Prominent, gas-filled loops of colon and prominent stool in the right colon without obstruction. Electronically Signed   By: Claudie Revering M.D.   On: 10/01/2019 17:44    ASSESSMENT / PLAN:  Transient Hypotension, likely in setting of Acute Blood Loss Anemia +/- Hemodialysis>> currently resolved Elevated Troponin, NSTEMI vs. Demand Ischemia Chronic HFrEF without acute Exacerbation Hx: CAD s/p CABG, mitral valve  replacement with mechanical valve on coumadin, Atrial fibrillation, HTN -Continuous Cardiac monitoring -Maintain MAP >65 -Received total 500 cc NS bolus -Transfusions as indicated -Hold antihypertensives -Continue Midodrine -IV vasopressors if needed to maintain MAP goal -Cardiology following, appreciate input -Trend troponin until downtrending -Unable to anticoagulate due to acute blood loss anemia -2D Echocardiogram pending  Acute Blood loss anemia in setting of hematoma of left thigh, along with supratherapeutic INR (pt on chronic coumadin for mechanical valve) -Monitor for S/Sx of bleeding -Trend CBC (Follow H&H q6h) -SCD's for VTE Prophylaxis (no chemical prophylaxis) -Transfuse for Hgb <7 -Hold Coumadin -Trend INR ~ INR 3.3 -Consider FFP if active bleeding  Fever of Unknown Etiology>>resolved -Monitor fever curve -Trend WBCs -Follow cultures as above -Empiric antibiotics have not been initiated as WBC normal and chest x-ray unremarkable -If patient again spikes fever will initiate antibiotics  Left femoral Pseudoaneurysm with development of Hematoma requiring stent placement -Management as per Vascular Surgery -Arterial US and CTA Abdomen/Pelvis 6/26 with large hematoma in proximal left thigh, but NO evidence of residual or recurrent pseudoaneurysm in the hematoma  ESRD on HD -Monitor I&O's / urinary output -Follow BMP -Ensure adequate renal perfusion -Avoid nephrotoxic agents as able -Replace electrolytes as indicated -Nephrology following, appreciate input -Hemodialysis as per Nephrology           DISPOSITION: ICU GOALS OF CARE: Full Code VTE PROPHYLAXIS: SCD's (no chemical prophylaxis given acute blood loss anemia) CONSULTS: Vascular Surgery, Hospitalist, Nephrology, Cardiology UPDATES: Updated pt at bedside 10/01/19  Darel Hong, [facility] [place] [facility] Pager: [phone]  10/01/2019, 8:48 PM

## 2019-10-01 NOTE — Progress Notes (Signed)
HERMILO DUTTER  MRN: [id number]  DOB/AGE: 1955/04/28 64 y.o.  Primary Care Physician:Sonnenberg, Angela Adam, MD  Admit date: 09/29/2019  Chief Complaint:  Chief Complaint  Patient presents with  . Post-op Problem    S-Pt presented on  09/29/2019 with  Chief Complaint  Patient presents with  . Post-op Problem   Patient offers no specific complaint. Patient family is present in the room as well.  Their main concern continues to be hematoma in his left thigh.  Medications . sodium chloride   Intravenous Once  . allopurinol  300 mg Oral Daily  . Chlorhexidine Gluconate Cloth  6 each Topical Q0600  . cinacalcet  30 mg Oral QODAY  . ezetimibe  10 mg Oral Daily  . hydroxychloroquine  200 mg Oral BID  . metoprolol tartrate      . metoprolol tartrate  2.5 mg Intravenous Once  . metoprolol tartrate  25 mg Oral BID  . pantoprazole  40 mg Oral Daily  . sevelamer carbonate  4,000 mg Oral TID WC  . sodium chloride flush  3 mL Intravenous Q12H         EHO:ZYYQM from the symptoms mentioned above,there are no other symptoms referable to all systems reviewed.  Physical Exam: Vital signs in last 24 hours: Temp:  [98.9 F (37.2 C)-102.8 F (39.3 C)] 99.8 F (37.7 C) (06/26 0450) Pulse Rate:  [42-156] 115 (06/26 0900) Resp:  [11-27] 19 (06/26 0900) BP: (79-135)/(33-104) 118/52 (06/26 0900) SpO2:  [91 %-100 %] 100 % (06/26 0900) Weight change:  Last BM Date: 09/28/19  Intake/Output from previous day: 06/25 0701 - 06/26 0700 In: 490.1 [P.O.:240; IV Piggyback:250.1] Out: -242  No intake/output data recorded.   Physical Exam: General- pt is awake,alert, oriented to time place and person Resp- No acute REsp distress, CTA B/L NO Rhonchi CVS- S1S2 regular in rate and rhythm GIT- BS+, soft, NT, ND EXT- NO LE Edema, but left thigh swollen ,no Cyanosis Access left AV fistula  Lab Results: CBC Recent Labs    09/30/19 0547 10/01/19 0358  WBC 8.6 9.0  HGB 6.9* 7.0*  HCT 21.5*  21.5*  PLT 125* 111*    BMET Recent Labs    09/30/19 0547 10/01/19 0358  NA 139 140  K 5.1 4.2  CL 99 98  CO2 27 31  GLUCOSE 110* 97  BUN 49* 24*  CREATININE 8.46* 5.98*  CALCIUM 8.5* 8.0*    MICRO Recent Results (from the past 240 hour(s))  SARS CORONAVIRUS 2 (TAT 6-24 HRS) Nasopharyngeal Nasopharyngeal Swab     Status: None   Collection Time: 09/27/19 12:31 PM   Specimen: Nasopharyngeal Swab  Result Value Ref Range Status   SARS Coronavirus 2 NEGATIVE NEGATIVE Final    Comment: (NOTE) SARS-CoV-2 target nucleic acids are NOT DETECTED.  The SARS-CoV-2 RNA is generally detectable in upper and lower respiratory specimens during the acute phase of infection. Negative results do not preclude SARS-CoV-2 infection, do not rule out co-infections with other pathogens, and should not be used as the sole basis for treatment or other patient management decisions. Negative results must be combined with clinical observations, patient history, and epidemiological information. The expected result is Negative.  Fact Sheet for Patients: SugarRoll.be  Fact Sheet for Healthcare Providers: https://www.woods-mathews.com/  This test is not yet approved or cleared by the [place] FDA and  has been authorized for detection and/or diagnosis of SARS-CoV-2 by FDA under an Emergency Use Authorization (EUA). This EUA will remain  in effect (meaning this test can be used) for the duration of the COVID-19 declaration under Se ction 564(b)(1) of the Act, 21 U.S.C. section 360bbb-3(b)(1), unless the authorization is terminated or revoked sooner.  Performed at [facility], [place] [address] [place], [place] [address]   Culture, blood (x 2)     Status: None (Preliminary result)   Collection Time: 10/01/19  3:56 AM   Specimen: BLOOD RIGHT HAND  Result Value Ref Range Status   Specimen Description BLOOD RIGHT HAND  Final   Special Requests    Final    BOTTLES DRAWN AEROBIC AND ANAEROBIC Blood Culture adequate volume   Culture   Final    NO GROWTH < 12 HOURS Performed at [facility], [address] [place], [place] [address]    Report Status PENDING  Incomplete  Culture, blood (x 2)     Status: None (Preliminary result)   Collection Time: 10/01/19  4:04 AM   Specimen: BLOOD  Result Value Ref Range Status   Specimen Description BLOOD RIGHT WRIST  Final   Special Requests   Final    BOTTLES DRAWN AEROBIC AND ANAEROBIC Blood Culture adequate volume   Culture   Final    NO GROWTH < 12 HOURS Performed at [facility], [address], [place], [place] [address]    Report Status PENDING  Incomplete      Lab Results  Component Value Date   PTH 281 (H) 12/14/2015   PTH Comment 12/14/2015   CALCIUM 8.0 (L) 10/01/2019   CAION 1.11 (L) 09/16/2018   PHOS 4.0 09/30/2019       Left Lower ext  Left common femoral artery and vein are patent based on color Doppler images. Evidence for flow in the left superficial femoral artery and left femoral vein. Large heterogeneous hematoma superficial to the left superficial femoral artery. Evidence for flow in the left profunda femoral artery and vein.  Heterogeneous hematoma in the left thigh measures 12.1 x 6.4 x 10.4 cm. No evidence for arterial flow within this large hematoma.  IMPRESSION: 1. Large hematoma in the proximal left thigh and left groin. No evidence for a pseudoaneurysm. 2. Proximal left femoral arteries and veins are patent.        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 Patient is on hemodialysis Patient is on Monday Wednesday Friday schedule. As an outpatient patient is under [facility] nephrology care and goes to hemodialysis on [place] on Monday Wednesday Friday Patient was last dialyzed yesterday  2) hypotension Patient blood pressure remains low Patient is being transitioned from second floor to ICU  3)Anemia of chronic disease complicated with blood loss Patient is now s/p transfusion  HGb is not at goal (9--11)   4) secondary hyperparathyroidism -CKD Mineral-Bone Disorder   Secondary Hyperparathyroidism present. Phosphorus at goal.   5) elevated troponins Primary team following   6) electrolytes   sodium Normonatremic   potassium Normokalemic  7)Acid base Co2 at goal     Plan:  Agree with the current treatment plan to shift patient to ICU No need for renal placement therapy today     Ronnel Zuercher s [facility] 10/01/2019, 10:22 AM

## 2019-10-01 NOTE — Progress Notes (Signed)
Post HD Assessment    09/30/19 2341  Neurological  Level of Consciousness Alert  Orientation Level Oriented X4  Respiratory  Respiratory Pattern Regular;Unlabored  Cardiac  Pulse Regular  Heart Sounds S1, S2  ECG Monitor Yes  Cardiac Rhythm NSR;ST;Atrial fibrillation;VF (All ECG results varied while on tx)  Integumentary  Integumentary (WDL) X  Psychosocial  Psychosocial (WDL) WDL

## 2019-10-01 NOTE — Progress Notes (Addendum)
Post HD  Pt intermittently with extreme tachycardia and cardiac rhythm throughout tx. Pt remained A&O x 4, no signs of distress and states that this is his normal pattern of behavior while on HD tx.Concern reported to [facility], MD no new orders; as well as Sherol Dade, RN who states that held metoprolol will now be given if BP will support it.    09/30/19 2342  Vital Signs  Temp 100.3 F (37.9 C)  Temp Source Oral  Pulse Rate (!) 50  Resp (!) 24  BP (!) 130/102  BP Location Right Arm  BP Method Automatic  Patient Position (if appropriate) Lying  Oxygen Therapy  SpO2 100 %  O2 Device Room Air  Pain Assessment  Pain Scale 0-10  Pain Score 0  Post-Hemodialysis Assessment  Rinseback Volume (mL) 500 mL  KECN 60.1 V  Dialyzer Clearance Lightly streaked  Duration of HD Treatment -hour(s) 3.2 hour(s)  Hemodialysis Intake (mL) 500 mL  UF Total -Machine (mL) 258 mL  Net UF (mL) -242 mL  Tolerated HD Treatment  (No complaints)  Post-Hemodialysis Comments  (n/a)  AVG/AVF Arterial Site Held (minutes)  (5 min)  AVG/AVF Venous Site Held (minutes)  (5 min)

## 2019-10-01 NOTE — Progress Notes (Signed)
Called from nursing staff:  Patient asymptomatic however, SBP 80-90s with continued tachycardia. Receiving 1 U pRBC. Patient states thigh feels better.  Considering patients extensive cardiac history and labile BP will transfer to ICU for closer monitoring and care.   Obtain CTA A/P to ensure no bleed not identified on [name] this morning. Keep NPO for now Monitor HgB Await ECHO results  Discussed with Intensivist- Dr. Mortimer Fries- appreciate input and care.

## 2019-10-01 NOTE — Progress Notes (Signed)
*  PRELIMINARY RESULTS* Echocardiogram 2D Echocardiogram has been performed.  Jeremy [name] 10/01/2019, 2:30 PM

## 2019-10-02 DIAGNOSIS — R5081 Fever presenting with conditions classified elsewhere: Secondary | ICD-10-CM

## 2019-10-02 DIAGNOSIS — R778 Other specified abnormalities of plasma proteins: Secondary | ICD-10-CM

## 2019-10-02 LAB — TYPE AND SCREEN
ABO/RH(D): O POS
Antibody Screen: NEGATIVE
Unit division: 0
Unit division: 0

## 2019-10-02 LAB — PROTIME-INR
INR: 3.3 — ABNORMAL HIGH (ref 0.8–1.2)
Prothrombin Time: 32.7 seconds — ABNORMAL HIGH (ref 11.4–15.2)

## 2019-10-02 LAB — PARATHYROID HORMONE, INTACT (NO CA): PTH: 141 pg/mL — ABNORMAL HIGH (ref 15–65)

## 2019-10-02 LAB — COMPREHENSIVE METABOLIC PANEL
ALT: 12 U/L (ref 0–44)
AST: 48 U/L — ABNORMAL HIGH (ref 15–41)
Albumin: 2.8 g/dL — ABNORMAL LOW (ref 3.5–5.0)
Alkaline Phosphatase: 77 U/L (ref 38–126)
Anion gap: 13 (ref 5–15)
BUN: 42 mg/dL — ABNORMAL HIGH (ref 8–23)
CO2: 29 mmol/L (ref 22–32)
Calcium: 8.9 mg/dL (ref 8.9–10.3)
Chloride: 96 mmol/L — ABNORMAL LOW (ref 98–111)
Creatinine, Ser: 8.77 mg/dL — ABNORMAL HIGH (ref 0.61–1.24)
GFR calc Af Amer: 7 mL/min — ABNORMAL LOW (ref >60)
GFR calc non Af Amer: 6 mL/min — ABNORMAL LOW (ref >60)
Glucose, Bld: 88 mg/dL (ref 70–99)
Potassium: 5.1 mmol/L (ref 3.5–5.1)
Sodium: 138 mmol/L (ref 135–145)
Total Bilirubin: 1.3 mg/dL — ABNORMAL HIGH (ref 0.3–1.2)
Total Protein: 5.9 g/dL — ABNORMAL LOW (ref 6.5–8.1)

## 2019-10-02 LAB — CBC WITH DIFFERENTIAL/PLATELET
Abs Immature Granulocytes: 0.03 10*3/uL (ref 0.00–0.07)
Basophils Absolute: 0 10*3/uL (ref 0.0–0.1)
Basophils Relative: 0 %
Eosinophils Absolute: 0.1 10*3/uL (ref 0.0–0.5)
Eosinophils Relative: 1 %
HCT: 23.9 % — ABNORMAL LOW (ref 39.0–52.0)
Hemoglobin: 8 g/dL — ABNORMAL LOW (ref 13.0–17.0)
Immature Granulocytes: 0 %
Lymphocytes Relative: 13 %
Lymphs Abs: 1.5 10*3/uL (ref 0.7–4.0)
MCH: 33.1 pg (ref 26.0–34.0)
MCHC: 33.5 g/dL (ref 30.0–36.0)
MCV: 98.8 fL (ref 80.0–100.0)
Monocytes Absolute: 0.7 10*3/uL (ref 0.1–1.0)
Monocytes Relative: 7 %
Neutro Abs: 8.7 10*3/uL — ABNORMAL HIGH (ref 1.7–7.7)
Neutrophils Relative %: 79 %
Platelets: 115 10*3/uL — ABNORMAL LOW (ref 150–400)
RBC: 2.42 MIL/uL — ABNORMAL LOW (ref 4.22–5.81)
RDW: 21.9 % — ABNORMAL HIGH (ref 11.5–15.5)
Smear Review: NORMAL
WBC: 11 10*3/uL — ABNORMAL HIGH (ref 4.0–10.5)
nRBC: 0 % (ref 0.0–0.2)

## 2019-10-02 LAB — TROPONIN I (HIGH SENSITIVITY): Troponin I (High Sensitivity): 8545 ng/L (ref <18)

## 2019-10-02 LAB — BPAM RBC
Blood Product Expiration Date: 202107062359
Blood Product Expiration Date: 202107072359
ISSUE DATE / TIME: 202106252030
ISSUE DATE / TIME: 202106261154
Unit Type and Rh: 9500
Unit Type and Rh: 9500

## 2019-10-02 MED ORDER — POLYETHYLENE GLYCOL 3350 17 G PO PACK
17.0000 g | PACK | Freq: Every day | ORAL | Status: DC | PRN
  Administered 2019-10-02: 17 g via ORAL
  Filled 2019-10-02: qty 1

## 2019-10-02 NOTE — Progress Notes (Signed)
Pt. has order to transfer to [place] in room 228. Pt. HR is slightly tachy, Abnormal heart rhythm and elevated troponin. On-call Vascular Surgeon,  on-call Cardiologist, and house [facility] are made aware of the situation and had a discussion with ICU Primary RN and ICU charge nurse as well.We will watch the patient closely.

## 2019-10-02 NOTE — Progress Notes (Addendum)
Progress Note  Patient Name: Jeremy [name] Date of Encounter: 10/02/2019  [facility] Cardiologist: Kathlyn Sacramento, MD   Subjective   64 yo with CAD, CABG We were asked to see him with  troponin elevation following a vascular surgical procedure and associated anemia and hypotension   Troponin has continued to increase.  Trop 8545 this am Still denies any chest pain   Inpatient Medications    Scheduled Meds: . allopurinol  300 mg Oral Daily  . Chlorhexidine Gluconate Cloth  6 each Topical Q0600  . cinacalcet  30 mg Oral QODAY  . ezetimibe  10 mg Oral Daily  . hydroxychloroquine  200 mg Oral BID  . metoprolol tartrate  2.5 mg Intravenous Once  . metoprolol tartrate  25 mg Oral BID  . pantoprazole  40 mg Oral Daily  . sevelamer carbonate  4,000 mg Oral TID WC  . sodium chloride flush  3 mL Intravenous Q12H   Continuous Infusions: . sodium chloride     PRN Meds: sodium chloride, acetaminophen **OR** acetaminophen, bisacodyl, morphine injection, nitroGLYCERIN, ondansetron (ZOFRAN) IV, oxyCODONE, simethicone, sodium chloride flush   Vital Signs    Vitals:   10/02/19 0700 10/02/19 0800 10/02/19 0900 10/02/19 1000  BP: 102/66 (!) 109/58 140/74 93/67  Pulse: (!) 108 (!) 110 (!) 110 (!) 112  Resp: 16 12 (!) 21 (!) 24  Temp:  98.8 F (37.1 C)    TempSrc:  Oral    SpO2: 100% 100% 100% 98%  Weight:      Height:        Intake/Output Summary (Last 24 hours) at 10/02/2019 1143 Last data filed at 10/01/2019 1504 Gross per 24 hour  Intake 380 ml  Output --  Net 380 ml   Last 3 Weights 10/02/2019 10/01/2019 09/30/2019  Weight (lbs) 194 lb 14.2 oz 192 lb 3.9 oz (No Data)  Weight (kg) 88.4 kg 87.2 kg (No Data)      Telemetry    Atrial flutter  , tachy  - Personally Reviewed  ECG      - Personally Reviewed  Physical Exam   GEN: No acute distress.   Neck: No JVD Cardiac: RRR, tachycardia . Mechanical S2.  Respiratory: Clear to auscultation bilaterally. GI: Soft,  nontender, non-distended  MS: left thigh is very tense,  Large hematoma  Neuro:  Nonfocal  Psych: Normal affect   Labs    High Sensitivity Troponin:   Recent Labs  Lab 10/01/19 0358 10/01/19 0635  TROPONINIHS 495* 2,763*      Chemistry Recent Labs  Lab 09/29/19 1504 09/29/19 1504 09/30/19 0547 10/01/19 0358 10/02/19 0517  NA 138   < > 139 140 138  K 4.1   < > 5.1 4.2 5.1  CL 96*   < > 99 98 96*  CO2 27   < > 27 31 29   GLUCOSE 109*   < > 110* 97 88  BUN 35*   < > 49* 24* 42*  CREATININE 7.49*   < > 8.46* 5.98* 8.77*  CALCIUM 8.9   < > 8.5* 8.0* 8.9  PROT 6.2*  --   --  5.5* 5.9*  ALBUMIN 3.3*  --   --  2.7* 2.8*  AST 18  --   --  23 48*  ALT 16  --   --  11 12  ALKPHOS 91  --   --  71 77  BILITOT 1.3*  --   --  1.1 1.3*  GFRNONAA 7*   < >  6* 9* 6*  GFRAA 8*   < > 7* 11* 7*  ANIONGAP 15   < > 13 11 13    < > = values in this interval not displayed.     Hematology Recent Labs  Lab 09/30/19 0547 09/30/19 0547 10/01/19 0358 10/01/19 0358 10/01/19 1724 10/01/19 2317 10/02/19 0517  WBC 8.6  --  9.0  --   --   --  11.0*  RBC 2.01*  --  2.07*  --   --   --  2.42*  HGB 6.9*   < > 7.0*   < > 7.9* 7.5* 8.0*  HCT 21.5*   < > 21.5*   < > 23.4* 22.5* 23.9*  MCV 107.0*  --  103.9*  --   --   --  98.8  MCH 34.3*  --  33.8  --   --   --  33.1  MCHC 32.1  --  32.6  --   --   --  33.5  RDW 15.9*  --  20.0*  --   --   --  21.9*  PLT 125*  --  111*  --   --   --  115*   < > = values in this interval not displayed.    BNPNo results for input(s): BNP, PROBNP in the last 168 hours.   DDimer No results for input(s): DDIMER in the last 168 hours.   Radiology    [name] Lower Ext Art Left Ltd  Result Date: 10/01/2019 CLINICAL DATA:  Recent treatment for left femoral artery pseudoaneurysm. Pseudoaneurysm was treated with covered stents. EXAM: LEFT LOWER EXTREMITY ARTERIAL DUPLEX- LIMITED TECHNIQUE: Gray-scale sonography as well as color Doppler and duplex ultrasound was  performed to evaluate the arteries in the left lower extremity. COMPARISON:  None. FINDINGS: Left common femoral artery and vein are patent based on color Doppler images. Evidence for flow in the left superficial femoral artery and left femoral vein. Large heterogeneous hematoma superficial to the left superficial femoral artery. Evidence for flow in the left profunda femoral artery and vein. Heterogeneous hematoma in the left thigh measures 12.1 x 6.4 x 10.4 cm. No evidence for arterial flow within this large hematoma. IMPRESSION: 1. Large hematoma in the proximal left thigh and left groin. No evidence for a pseudoaneurysm. 2. Proximal left femoral arteries and veins are patent. Electronically Signed   By: Markus Daft M.D.   On: 10/01/2019 12:19   DG Chest Port 1 View  Result Date: 10/01/2019 CLINICAL DATA:  Sepsis EXAM: PORTABLE CHEST 1 VIEW COMPARISON:  03/15/2019 FINDINGS: Remote median sternotomy and CABG. Mild cardiomegaly. Unchanged interstitial opacities. No focal airspace consolidation. IMPRESSION: Unchanged mild cardiomegaly without acute airspace disease. Electronically Signed   By: Ulyses Jarred M.D.   On: 10/01/2019 04:15   CT Angio Abd/Pel w/ and/or w/o  Result Date: 10/01/2019 CLINICAL DATA:  Recently treated left femoral artery pseudoaneurysm. The aneurysm was treated with covered stents. There was a large heterogeneous hematoma in the left thigh on a recent left lower extremity arterial duplex examination, earlier today. There was no evidence of pseudoaneurysm on that examination. EXAM: CTA ABDOMEN AND PELVIS WITHOUT AND WITH CONTRAST TECHNIQUE: Multidetector CT imaging of the abdomen and pelvis was performed using the standard protocol during bolus administration of intravenous contrast. Multiplanar reconstructed images and MIPs were obtained and reviewed to evaluate the vascular anatomy. CONTRAST:  114mL OMNIPAQUE IOHEXOL 350 MG/ML SOLN COMPARISON:  Abdomen and pelvis CT dated 03/02/2019.  Limited left  lower extremity arterial duplex ultrasound obtained earlier today. FINDINGS: VASCULAR Aorta: Extensive atheromatous arterial calcifications without aneurysm or dissection. Celiac: The celiac axis and superior mesenteric artery arise from a common trunk. The origin has a normal appearance. Peripheral arterial calcifications are noted. SMA: Arises from a common celiac axis and superior mesenteric artery trunk. Small amount of atheromatous changes proximally without significant luminal stenosis. Renals: Dense calcifications proximally. The remainder of the renal arteries are surgically absent, along with the kidneys. IMA: Atheromatous changes proximally with greater than 90% stenosis. Inflow: Extensive atheromatous changes without significant areas of stenosis. Proximal Outflow: Bilateral femoral atheromatous changes. Stents in the left femoral artery. These are patent. No contrast extravasation. Veins: No obvious venous abnormality within the limitations of this arterial phase study. Review of the MIP images confirms the above findings. NON-VASCULAR Lower chest: Minimal bibasilar linear atelectasis/scarring. Enlarged heart. Hepatobiliary: Vicariously excreted contrast in the gallbladder. Unremarkable liver. Pancreas: Unremarkable. No pancreatic ductal dilatation or surrounding inflammatory changes. Spleen: Normal in size without focal abnormality. Adrenals/Urinary Tract: Normal appearing adrenal glands. Surgically absent kidneys. 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 Collapsed urinary bladder. Stomach/Bowel: Prominent, gas-filled loops of colon and prominent stool in the right colon with no evidence of obstruction. Unremarkable stomach, small bowel and appendix. Lymphatic: No enlarged lymph nodes. Reproductive: Minimally enlarged prostate gland. Other: Large, heterogeneous hematoma in the anterior proximal left thigh. This measures approximately  13.6 x 12.5 x 8.0 cm. No extravasated contrast in the hematoma. Musculoskeletal: Diffuse bone sclerosis. L5-S1 degenerative changes. IMPRESSION: VASCULAR 1. Large, heterogeneous hematoma in the anterior proximal left thigh. No evidence of residual or recurrent pseudoaneurysm in the hematoma. 2. Extensive atheromatous changes with greater than 90% stenosis of the proximal inferior mesenteric artery. 3. Common celiac axis and superior mesenteric artery trunk without stenosis. NON-VASCULAR 1. 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 2. Diffuse bone sclerosis, compatible with renal osteodystrophy. 3. Cardiomegaly. 4. Prominent, gas-filled loops of colon and prominent stool in the right colon without obstruction. Electronically Signed   By: Claudie Revering M.D.   On: 10/01/2019 17:44    Cardiac Studies      Patient Profile     64 y.o. male with knows CAD, CABG admitted after vascular procedure   Assessment & Plan    1. CAD :  Pt has a troponin elevation .   Last trop level was 8545.    I suspect this is demand ischemia in the setting of anemia  , hypotension, fever .  He has knows small vessel disease and hx of CABG.   he has no symptoms of ACS.  Cont to follow  Denies any angina Cont. Supportive care .  Further work up , if any , per Dr. [name] .      2.  Atrial flutter:  Cont meds.   Cont to treat underlying issues that are contributing to his tachycardai.   3.  Anemia :         For questions or updates, please contact [place] Please consult [email or web address] for contact info under        Signed, Mertie Moores, MD  10/02/2019, 11:43 AM

## 2019-10-02 NOTE — Progress Notes (Signed)
Pt in no acute distress. A&OX4. Tylenol administered for groin pain this morning. Thigh circumference measured at 67 cm this shift. Pedal pulses dopplered bilaterally. Symmetrical movement in all extremities. Per cardiology pt okay to go to med-surg floor on cardiac monitoring. VSS, slightly tachycardic. Per cardiology give the held dose of metoprolol. Miralax administered for constipation.

## 2019-10-02 NOTE — Progress Notes (Signed)
3 Days Post-Op   Subjective/Chief Complaint: Feeling better. Thigh with tenderness and discomfort. No further hypotension or fevers over night.  CTA- no evidence of continued bleed. HgB stable. 8.  Objective: Vital signs in last 24 hours: Temp:  [97.6 F (36.4 C)-101.3 F (38.5 C)] 97.6 F (36.4 C) (06/27 0400) Pulse Rate:  [92-138] 110 (06/27 0800) Resp:  [0-26] 12 (06/27 0800) BP: (89-129)/(45-94) 109/58 (06/27 0800) SpO2:  [77 %-100 %] 100 % (06/27 0800) Weight:  [87.2 kg-88.4 kg] 88.4 kg (06/27 0335) Last BM Date: 09/28/19  Intake/Output from previous day: 06/26 0701 - 06/27 0700 In: 380 [Blood:380] Out: -  Intake/Output this shift: No intake/output data recorded.  General appearance: alert and no distress Resp: clear to auscultation bilaterally Cardio: regularly irregular rhythm Extremities: LEFT lower extremity-warm to toes, thigh- unchanged from prior exam,firm proximally, ecchymosis,   Lab Results:  Recent Labs    10/01/19 0358 10/01/19 1724 10/01/19 2317 10/02/19 0517  WBC 9.0  --   --  11.0*  HGB 7.0*   < > 7.5* 8.0*  HCT 21.5*   < > 22.5* 23.9*  PLT 111*  --   --  115*   < > = values in this interval not displayed.   BMET Recent Labs    10/01/19 0358 10/02/19 0517  NA 140 138  K 4.2 5.1  CL 98 96*  CO2 31 29  GLUCOSE 97 88  BUN 24* 42*  CREATININE 5.98* 8.77*  CALCIUM 8.0* 8.9   PT/INR Recent Labs    10/01/19 0358 10/02/19 0517  LABPROT 32.3* 32.7*  INR 3.3* 3.3*   ABG No results for input(s): PHART, HCO3 in the last 72 hours.  Invalid input(s): PCO2, PO2  Studies/Results: [name] Lower Ext Art Left Ltd  Result Date: 10/01/2019 CLINICAL DATA:  Recent treatment for left femoral artery pseudoaneurysm. Pseudoaneurysm was treated with covered stents. EXAM: LEFT LOWER EXTREMITY ARTERIAL DUPLEX- LIMITED TECHNIQUE: Gray-scale sonography as well as color Doppler and duplex ultrasound was performed to evaluate the arteries in the left lower  extremity. COMPARISON:  None. FINDINGS: Left common femoral artery and vein are patent based on color Doppler images. Evidence for flow in the left superficial femoral artery and left femoral vein. Large heterogeneous hematoma superficial to the left superficial femoral artery. Evidence for flow in the left profunda femoral artery and vein. Heterogeneous hematoma in the left thigh measures 12.1 x 6.4 x 10.4 cm. No evidence for arterial flow within this large hematoma. IMPRESSION: 1. Large hematoma in the proximal left thigh and left groin. No evidence for a pseudoaneurysm. 2. Proximal left femoral arteries and veins are patent. Electronically Signed   By: Markus Daft M.D.   On: 10/01/2019 12:19   DG Chest Port 1 View  Result Date: 10/01/2019 CLINICAL DATA:  Sepsis EXAM: PORTABLE CHEST 1 VIEW COMPARISON:  03/15/2019 FINDINGS: Remote median sternotomy and CABG. Mild cardiomegaly. Unchanged interstitial opacities. No focal airspace consolidation. IMPRESSION: Unchanged mild cardiomegaly without acute airspace disease. Electronically Signed   By: Ulyses Jarred M.D.   On: 10/01/2019 04:15   CT Angio Abd/Pel w/ and/or w/o  Result Date: 10/01/2019 CLINICAL DATA:  Recently treated left femoral artery pseudoaneurysm. The aneurysm was treated with covered stents. There was a large heterogeneous hematoma in the left thigh on a recent left lower extremity arterial duplex examination, earlier today. There was no evidence of pseudoaneurysm on that examination. EXAM: CTA ABDOMEN AND PELVIS WITHOUT AND WITH CONTRAST TECHNIQUE: Multidetector CT imaging of the  abdomen and pelvis was performed using the standard protocol during bolus administration of intravenous contrast. Multiplanar reconstructed images and MIPs were obtained and reviewed to evaluate the vascular anatomy. CONTRAST:  146mL OMNIPAQUE IOHEXOL 350 MG/ML SOLN COMPARISON:  Abdomen and pelvis CT dated 03/02/2019. Limited left lower extremity arterial duplex  ultrasound obtained earlier today. FINDINGS: VASCULAR Aorta: Extensive atheromatous arterial calcifications without aneurysm or dissection. Celiac: The celiac axis and superior mesenteric artery arise from a common trunk. The origin has a normal appearance. Peripheral arterial calcifications are noted. SMA: Arises from a common celiac axis and superior mesenteric artery trunk. Small amount of atheromatous changes proximally without significant luminal stenosis. Renals: Dense calcifications proximally. The remainder of the renal arteries are surgically absent, along with the kidneys. IMA: Atheromatous changes proximally with greater than 90% stenosis. Inflow: Extensive atheromatous changes without significant areas of stenosis. Proximal Outflow: Bilateral femoral atheromatous changes. Stents in the left femoral artery. These are patent. No contrast extravasation. Veins: No obvious venous abnormality within the limitations of this arterial phase study. Review of the MIP images confirms the above findings. NON-VASCULAR Lower chest: Minimal bibasilar linear atelectasis/scarring. Enlarged heart. Hepatobiliary: Vicariously excreted contrast in the gallbladder. Unremarkable liver. Pancreas: Unremarkable. No pancreatic ductal dilatation or surrounding inflammatory changes. Spleen: Normal in size without focal abnormality. Adrenals/Urinary Tract: Normal appearing adrenal glands. Surgically absent kidneys. 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 Collapsed urinary bladder. Stomach/Bowel: Prominent, gas-filled loops of colon and prominent stool in the right colon with no evidence of obstruction. Unremarkable stomach, small bowel and appendix. Lymphatic: No enlarged lymph nodes. Reproductive: Minimally enlarged prostate gland. Other: Large, heterogeneous hematoma in the anterior proximal left thigh. This measures approximately 13.6 x 12.5 x 8.0 cm. No extravasated contrast  in the hematoma. Musculoskeletal: Diffuse bone sclerosis. L5-S1 degenerative changes. IMPRESSION: VASCULAR 1. Large, heterogeneous hematoma in the anterior proximal left thigh. No evidence of residual or recurrent pseudoaneurysm in the hematoma. 2. Extensive atheromatous changes with greater than 90% stenosis of the proximal inferior mesenteric artery. 3. Common celiac axis and superior mesenteric artery trunk without stenosis. NON-VASCULAR 1. 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 2. Diffuse bone sclerosis, compatible with renal osteodystrophy. 3. Cardiomegaly. 4. Prominent, gas-filled loops of colon and prominent stool in the right colon without obstruction. Electronically Signed   By: Claudie Revering M.D.   On: 10/01/2019 17:44    Anti-infectives: Anti-infectives (From admission, onward)   Start     Dose/Rate Route Frequency Ordered Stop   09/30/19 0800  ceFAZolin (ANCEF) IVPB 1 g/50 mL premix       Note to Pharmacy: To be given in specials   1 g 100 mL/hr over 30 Minutes Intravenous  Once 09/29/19 1530 09/30/19 1627   09/29/19 2200  hydroxychloroquine (PLAQUENIL) tablet 200 mg     Discontinue     200 mg Oral 2 times daily 09/29/19 1654        Assessment/Plan: s/p Procedure(s): Lower Extremity Angiography (Left) Pseudoaneurysm- thrombin injection; CFA, SFA, Profunda Stenting   Improved overnight. BP- stable. HgB 8 Appreciate Intensivist input/care No evidence of further bleeding.  INR- 3.3- Continue to Hold Coumadin. ESRD- will need HD tomorrow- per Nephrology recommendations NSTEMI; A Flutter- no angina,continue Metoprolol; ECHO- appreciate Cardiology input/care Fever resolved- Bld Cx- no growth to date. Will monitor.  OK for OOB with PT today.   LOS: 1 day    Jamesetta So A 10/02/2019

## 2019-10-02 NOTE — Progress Notes (Signed)
Jeremy [name]  MRN: [id number]  DOB/AGE: 1956/01/15 64 y.o.  Primary Care Physician:Sonnenberg, Angela Adam, MD  Admit date: 09/29/2019  Chief Complaint:  Chief Complaint  Patient presents with  . Post-op Problem    S-Pt presented on  09/29/2019 with  Chief Complaint  Patient presents with  . Post-op Problem   Patient offers no specific complaint. Patient seen in the ICU  Medications . allopurinol  300 mg Oral Daily  . Chlorhexidine Gluconate Cloth  6 each Topical Q0600  . cinacalcet  30 mg Oral QODAY  . ezetimibe  10 mg Oral Daily  . hydroxychloroquine  200 mg Oral BID  . metoprolol tartrate  2.5 mg Intravenous Once  . metoprolol tartrate  25 mg Oral BID  . pantoprazole  40 mg Oral Daily  . sevelamer carbonate  4,000 mg Oral TID WC  . sodium chloride flush  3 mL Intravenous Q12H         EHM:CNOBS from the symptoms mentioned above,there are no other symptoms referable to all systems reviewed.  Physical Exam: Vital signs in last 24 hours: Temp:  [97.6 F (36.4 C)-101.3 F (38.5 C)] 97.6 F (36.4 C) (06/27 0400) Pulse Rate:  [92-138] 103 (06/27 0600) Resp:  [0-26] 16 (06/27 0600) BP: (89-135)/(45-94) 117/74 (06/27 0600) SpO2:  [77 %-100 %] 100 % (06/27 0600) Weight:  [87.2 kg-88.4 kg] 88.4 kg (06/27 0335) Weight change:  Last BM Date: 09/28/19  Intake/Output from previous day: 06/26 0701 - 06/27 0700 In: 380 [Blood:380] Out: -  No intake/output data recorded.   Physical Exam: General- pt is awake,alert, oriented to time place and person Resp- No acute REsp distress, CTA B/L NO Rhonchi CVS- S1S2 regular in rate and rhythm GIT- BS+, soft, NT, ND EXT- NO LE Edema, but left thigh swollen ,no Cyanosis Access left AV fistula  Lab Results: CBC Recent Labs    10/01/19 0358 10/01/19 1724 10/01/19 2317 10/02/19 0517  WBC 9.0  --   --  11.0*  HGB 7.0*   < > 7.5* 8.0*  HCT 21.5*   < > 22.5* 23.9*  PLT 111*  --   --  115*   < > = values in this interval not  displayed.    BMET Recent Labs    10/01/19 0358 10/02/19 0517  NA 140 138  K 4.2 5.1  CL 98 96*  CO2 31 29  GLUCOSE 97 88  BUN 24* 42*  CREATININE 5.98* 8.77*  CALCIUM 8.0* 8.9    MICRO Recent Results (from the past 240 hour(s))  SARS CORONAVIRUS 2 (TAT 6-24 HRS) Nasopharyngeal Nasopharyngeal Swab     Status: None   Collection Time: 09/27/19 12:31 PM   Specimen: Nasopharyngeal Swab  Result Value Ref Range Status   SARS Coronavirus 2 NEGATIVE NEGATIVE Final    Comment: (NOTE) SARS-CoV-2 target nucleic acids are NOT DETECTED.  The SARS-CoV-2 RNA is generally detectable in upper and lower respiratory specimens during the acute phase of infection. Negative results do not preclude SARS-CoV-2 infection, do not rule out co-infections with other pathogens, and should not be used as the sole basis for treatment or other patient management decisions. Negative results must be combined with clinical observations, patient history, and epidemiological information. The expected result is Negative.  Fact Sheet for Patients: SugarRoll.be  Fact Sheet for Healthcare Providers: https://www.woods-mathews.com/  This test is not yet approved or cleared by the [place] FDA and  has been authorized for detection and/or diagnosis of SARS-CoV-2 by  FDA under an Emergency Use Authorization (EUA). This EUA will remain  in effect (meaning this test can be used) for the duration of the COVID-19 declaration under Se ction 564(b)(1) of the Act, 21 U.S.C. section 360bbb-3(b)(1), unless the authorization is terminated or revoked sooner.  Performed at [facility], [place] [address] [place], [place] [address]   Culture, blood (x 2)     Status: None (Preliminary result)   Collection Time: 10/01/19  3:56 AM   Specimen: BLOOD RIGHT HAND  Result Value Ref Range Status   Specimen Description BLOOD RIGHT HAND  Final   Special Requests   Final     BOTTLES DRAWN AEROBIC AND ANAEROBIC Blood Culture adequate volume   Culture   Final    NO GROWTH < 12 HOURS Performed at [facility], [address] [place], [place] [address]    Report Status PENDING  Incomplete  Culture, blood (x 2)     Status: None (Preliminary result)   Collection Time: 10/01/19  4:04 AM   Specimen: BLOOD  Result Value Ref Range Status   Specimen Description BLOOD RIGHT WRIST  Final   Special Requests   Final    BOTTLES DRAWN AEROBIC AND ANAEROBIC Blood Culture adequate volume   Culture   Final    NO GROWTH < 12 HOURS Performed at [facility], [address], [place], [place] [address]    Report Status PENDING  Incomplete  MRSA PCR Screening     Status: None   Collection Time: 10/01/19  3:19 PM   Specimen: Nasopharyngeal  Result Value Ref Range Status   MRSA by PCR NEGATIVE NEGATIVE Final    Comment:        The GeneXpert MRSA Assay (FDA approved for NASAL specimens only), is one component of a comprehensive MRSA colonization surveillance program. It is not intended to diagnose MRSA infection nor to guide or monitor treatment for MRSA infections. Performed at [facility], [address] [place], [place] [address]       Lab Results  Component Value Date   PTH 281 (H) 12/14/2015   PTH Comment 12/14/2015   CALCIUM 8.9 10/02/2019   CAION 1.11 (L) 09/16/2018   PHOS 4.0 09/30/2019       Left Lower ext  Left common femoral artery and vein are patent based on color Doppler images. Evidence for flow in the left superficial femoral artery and left femoral vein. Large heterogeneous hematoma superficial to the left superficial femoral artery. Evidence for flow in the left profunda femoral artery and vein.  Heterogeneous hematoma in the left thigh measures 12.1 x 6.4 x 10.4 cm. No evidence for arterial flow within this large hematoma.  IMPRESSION: 1. Large hematoma in the proximal left thigh and left groin.  No evidence for a pseudoaneurysm. 2. Proximal left femoral arteries and veins are patent.        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 Patient is on  hemodialysis Patient is on Monday Wednesday Friday schedule. As an outpatient patient is under [facility] nephrology care and goes to hemodialysis on [address] on Monday Wednesday Friday.  Patient was last dialyzed on Friday No need for dialysis today  2) hypotension Blood pressure stable Patient is now in  ICU Patient is now better  3)Anemia of chronic disease complicated with blood loss Patient is now s/p transfusion  HGb is not at goal (9--11)   4) secondary hyperparathyroidism -CKD Mineral-Bone Disorder   Secondary Hyperparathyroidism present. Phosphorus at goal.   5) elevated troponins Primary team following   6) electrolytes   sodium Normonatremic   potassium Normokalemic    7)Acid base Co2 at goal     Plan:   No need for renal placement therapy today We will dialyze in the morning    Awanda Wilcock s [facility] 10/02/2019, 7:20 AM

## 2019-10-02 NOTE — Progress Notes (Signed)
PROGRESS NOTE    Jeremy [name]  DJT:[id number] DOB: July 14, 1955 DOA: 09/29/2019 PCP: Leone Haven, MD    Brief Narrative:  Patient with extensive vascular history, coronary artery disease status post CABG in 2020, PCI in 2021 April, chronic systolic heart failure ejection fraction 40% secondary to ischemic cardiomyopathy, mechanical aortic valve on Coumadin, chronic A. 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 Hemoglobin 6.9, temperature 103 after second vascular procedure and dialysis.  Hospitalist consulted.  Assessment & Plan:   Principal Problem:   Fever Active Problems:   S/P AVR (aortic valve replacement)   ESRD (end stage renal disease) (HCC)   CAD (coronary artery disease)   Essential hypertension   OSA (obstructive sleep apnea)   Atrial fibrillation (HCC)   Chronic anticoagulation   S/P angioplasty with stent   H/O mitral valve replacement with mechanical valve   Hematoma of thigh, left, initial encounter   Elevated troponin   Thigh hematoma   Postprocedural hypotension  Symptomatic anemia, acute blood loss anemia secondary to left thigh hematoma causing hypotension in the setting of coagulopathy due to Coumadin and INR more than 3. Received total 2 units of PRBC with appropriate response.  Hemoglobin 8.  No indication for further transfusion.  Continue to monitor hemoglobin. Coumadin on hold.  Avoid reversal due to presence of mechanical heart valve.  Febrile episode: Etiology unknown.  Probably vascular manipulation.  No localized evidence of infection.  CT scan with no evidence of abscess.  Blood cultures negative so far.  Already improving.  Monitoring off antibiotics.  A. fib with RVR: Due to  hemodynamic disturbance.  Stabilizing.  Elevated troponin: With extensive cardiac history.  Suspect demand ischemia.  Followed by cardiology.  ESRD on hemodialysis: Followed by nephrology.  Dialysis on a schedule.  Obstructive sleep apnea: Using CPAP at night.  Left femoral artery angioplasty with a stent, thrombin injection for pseudoaneurysm resulting hematoma: Conservative management.  As per surgery.  Mobilize today.  Thank you for involving [name] in this patient's care.  We will continue to follow.   DVT prophylaxis: Place TED hose Start: 09/29/19 1648   Code Status: Full code Family Communication: None.  Daughter at bedside 6/26 Disposition Plan: Status is: Inpatient  Remains inpatient appropriate because:Inpatient level of care appropriate due to severity of illness   Dispo: The patient is from: Home              Anticipated d/c is to: Home              Anticipated d/c date is: 3 days              Patient currently is not medically stable to d/c.           Subjective: Patient seen and examined.  He thinks his thigh pain is better.  Denies any chest pain or shortness of breath.  He will start mobilizing today.  Telemetry shows A. fib with heart rate 100-110.  Objective: Vitals:   10/02/19 1000 10/02/19 1100 10/02/19 1200 10/02/19 1300  BP: 93/67 104/77 104/81 111/71  Pulse: (!) 112 (!) 114 (!) 113 (!) 113  Resp: (!) 24 (!) 24 (!) 24 15  Temp:    99.4 F (37.4 C)  TempSrc:    Oral  SpO2:  98% 94% 97% 96%  Weight:      Height:        Intake/Output Summary (Last 24 hours) at 10/02/2019 1354 Last data filed at 10/01/2019 1504 Gross per 24 hour  Intake 380 ml  Output --  Net 380 ml   Filed Weights   09/29/19 1614 10/01/19 1515 10/02/19 0335  Weight: 83.9 kg 87.2 kg 88.4 kg    Examination:  General exam: Chronically sick looking, however currently comfortable appearing. Respiratory system: Clear to auscultation.  No added sound. Cardiovascular system: S1  & S2 heard, mechanical aortic sound, irregularly irregular rhythm.  Gastrointestinal system: Abdomen is nondistended, soft and nontender. No organomegaly or masses felt. Normal bowel sounds heard. Central nervous system: Alert and oriented. No focal neurological deficits. Extremities:  Left upper extremity AV fistula present with thrill. Left thigh with swelling, tense skin, asymmetrical more than right thigh.  Diffuse tenderness.  No increased temperature.    Data Reviewed: I have personally reviewed following labs and imaging studies  CBC: Recent Labs  Lab 09/29/19 1504 09/29/19 1504 09/29/19 1936 09/29/19 1936 09/30/19 0547 10/01/19 0358 10/01/19 1724 10/01/19 2317 10/02/19 0517  WBC 9.1  --  9.3  --  8.6 9.0  --   --  11.0*  NEUTROABS 6.8  --   --   --   --  7.2  --   --  8.7*  HGB 8.4*   < > 7.7*   < > 6.9* 7.0* 7.9* 7.5* 8.0*  HCT 26.9*   < > 25.1*   < > 21.5* 21.5* 23.4* 22.5* 23.9*  MCV 110.2*  --  111.6*  --  107.0* 103.9*  --   --  98.8  PLT 134*  --  134*  --  125* 111*  --   --  115*   < > = values in this interval not displayed.   Basic Metabolic Panel: Recent Labs  Lab 09/29/19 1504 09/30/19 0547 09/30/19 2023 10/01/19 0358 10/02/19 0517  NA 138 139  --  140 138  K 4.1 5.1  --  4.2 5.1  CL 96* 99  --  98 96*  CO2 27 27  --  31 29  GLUCOSE 109* 110*  --  97 88  BUN 35* 49*  --  24* 42*  CREATININE 7.49* 8.46*  --  5.98* 8.77*  CALCIUM 8.9 8.5*  --  8.0* 8.9  MG  --  2.2  --  1.7  --   PHOS  --   --  4.0  --   --    GFR: Estimated Creatinine Clearance: 9.8 mL/min (A) (by C-G formula based on SCr of 8.77 mg/dL (H)). Liver Function Tests: Recent Labs  Lab 09/29/19 1504 10/01/19 0358 10/02/19 0517  AST 18 23 48*  ALT 16 11 12   ALKPHOS 91 71 77  BILITOT 1.3* 1.1 1.3*  PROT 6.2* 5.5* 5.9*  ALBUMIN 3.3* 2.7* 2.8*   No results for input(s): LIPASE, AMYLASE in the last 168 hours. No results for input(s): AMMONIA in the last 168 hours. Coagulation  Profile: Recent Labs  Lab 09/26/19 0000 09/29/19 1504 09/30/19 0547 10/01/19 0358 10/02/19 0517  INR 2.6 2.3* 2.7* 3.3* 3.3*   Cardiac Enzymes: No results for input(s): CKTOTAL, CKMB, CKMBINDEX, TROPONINI in the last 168 hours. BNP (last 3 results) No results for input(s): PROBNP in the last 8760 hours. HbA1C: No results for input(s): HGBA1C in the last 72 hours. CBG: Recent Labs  Lab 10/01/19 1520  GLUCAP 84   Lipid Profile: No results for input(s): CHOL, HDL, LDLCALC, TRIG, CHOLHDL, LDLDIRECT in the last 72 hours. Thyroid Function Tests: No results for input(s): TSH, T4TOTAL, FREET4, T3FREE, THYROIDAB in the last 72 hours. Anemia Panel: No results for input(s): VITAMINB12, FOLATE, FERRITIN, TIBC, IRON, RETICCTPCT in the last 72 hours. Sepsis Labs: Recent Labs  Lab 10/01/19 0358 10/01/19 0635  LATICACIDVEN 1.4 1.7    Recent Results (from the past 240 hour(s))  SARS CORONAVIRUS 2 (TAT 6-24 HRS) Nasopharyngeal Nasopharyngeal Swab     Status: None   Collection Time: 09/27/19 12:31 PM   Specimen: Nasopharyngeal Swab  Result Value Ref Range Status   SARS Coronavirus 2 NEGATIVE NEGATIVE Final    Comment: (NOTE) SARS-CoV-2 target nucleic acids are NOT DETECTED.  The SARS-CoV-2 RNA is generally detectable in upper and lower respiratory specimens during the acute phase of infection. Negative results do not preclude SARS-CoV-2 infection, do not rule out co-infections with other pathogens, and should not be used as the sole basis for treatment or other patient management decisions. Negative results must be combined with clinical observations, patient history, and epidemiological information. The expected result is Negative.  Fact Sheet for Patients: SugarRoll.be  Fact Sheet for Healthcare Providers: https://www.woods-mathews.com/  This test is not yet approved or cleared by the [place] FDA and  has been authorized for  detection and/or diagnosis of SARS-CoV-2 by FDA under an Emergency Use Authorization (EUA). This EUA will remain  in effect (meaning this test can be used) for the duration of the COVID-19 declaration under Se ction 564(b)(1) of the Act, 21 U.S.C. section 360bbb-3(b)(1), unless the authorization is terminated or revoked sooner.  Performed at [facility], [place] [address], [place], [place] [address]   Culture, blood (x 2)     Status: None (Preliminary result)   Collection Time: 10/01/19  3:56 AM   Specimen: BLOOD RIGHT HAND  Result Value Ref Range Status   Specimen Description BLOOD RIGHT HAND  Final   Special Requests   Final    BOTTLES DRAWN AEROBIC AND ANAEROBIC Blood Culture adequate volume   Culture   Final    NO GROWTH 1 DAY Performed at [facility], [address], [place], [place] [address]    Report Status PENDING  Incomplete  Culture, blood (x 2)     Status: None (Preliminary result)   Collection Time: 10/01/19  4:04 AM   Specimen: BLOOD  Result Value Ref Range Status   Specimen Description BLOOD RIGHT WRIST  Final   Special Requests   Final    BOTTLES DRAWN AEROBIC AND ANAEROBIC Blood Culture adequate volume   Culture   Final    NO GROWTH 1 DAY Performed at [facility], [address] [place], [place] [address]    Report Status PENDING  Incomplete  MRSA PCR Screening     Status: None   Collection Time: 10/01/19  3:19 PM   Specimen: Nasopharyngeal  Result Value Ref Range Status   MRSA by PCR NEGATIVE NEGATIVE Final    Comment:        The GeneXpert MRSA Assay (FDA approved for NASAL specimens only), is one component of a comprehensive MRSA colonization surveillance program. It is not intended to diagnose MRSA infection nor to guide or monitor treatment for MRSA infections. Performed at [facility], [address], [place], [place] [address]          Radiology Studies: [name] Lower Ext Art Left  Ltd  Result Date:  10/01/2019 CLINICAL DATA:  Recent treatment for left femoral artery pseudoaneurysm. Pseudoaneurysm was treated with covered stents. EXAM: LEFT LOWER EXTREMITY ARTERIAL DUPLEX- LIMITED TECHNIQUE: Gray-scale sonography as well as color Doppler and duplex ultrasound was performed to evaluate the arteries in the left lower extremity. COMPARISON:  None. FINDINGS: Left common femoral artery and vein are patent based on color Doppler images. Evidence for flow in the left superficial femoral artery and left femoral vein. Large heterogeneous hematoma superficial to the left superficial femoral artery. Evidence for flow in the left profunda femoral artery and vein. Heterogeneous hematoma in the left thigh measures 12.1 x 6.4 x 10.4 cm. No evidence for arterial flow within this large hematoma. IMPRESSION: 1. Large hematoma in the proximal left thigh and left groin. No evidence for a pseudoaneurysm. 2. Proximal left femoral arteries and veins are patent. Electronically Signed   By: Markus Daft M.D.   On: 10/01/2019 12:19   DG Chest Port 1 View  Result Date: 10/01/2019 CLINICAL DATA:  Sepsis EXAM: PORTABLE CHEST 1 VIEW COMPARISON:  03/15/2019 FINDINGS: Remote median sternotomy and CABG. Mild cardiomegaly. Unchanged interstitial opacities. No focal airspace consolidation. IMPRESSION: Unchanged mild cardiomegaly without acute airspace disease. Electronically Signed   By: Ulyses Jarred M.D.   On: 10/01/2019 04:15   CT Angio Abd/Pel w/ and/or w/o  Result Date: 10/01/2019 CLINICAL DATA:  Recently treated left femoral artery pseudoaneurysm. The aneurysm was treated with covered stents. There was a large heterogeneous hematoma in the left thigh on a recent left lower extremity arterial duplex examination, earlier today. There was no evidence of pseudoaneurysm on that examination. EXAM: CTA ABDOMEN AND PELVIS WITHOUT AND WITH CONTRAST TECHNIQUE: Multidetector CT imaging of the abdomen and pelvis was performed using the standard  protocol during bolus administration of intravenous contrast. Multiplanar reconstructed images and MIPs were obtained and reviewed to evaluate the vascular anatomy. CONTRAST:  16mL OMNIPAQUE IOHEXOL 350 MG/ML SOLN COMPARISON:  Abdomen and pelvis CT dated 03/02/2019. Limited left lower extremity arterial duplex ultrasound obtained earlier today. FINDINGS: VASCULAR Aorta: Extensive atheromatous arterial calcifications without aneurysm or dissection. Celiac: The celiac axis and superior mesenteric artery arise from a common trunk. The origin has a normal appearance. Peripheral arterial calcifications are noted. SMA: Arises from a common celiac axis and superior mesenteric artery trunk. Small amount of atheromatous changes proximally without significant luminal stenosis. Renals: Dense calcifications proximally. The remainder of the renal arteries are surgically absent, along with the kidneys. IMA: Atheromatous changes proximally with greater than 90% stenosis. Inflow: Extensive atheromatous changes without significant areas of stenosis. Proximal Outflow: Bilateral femoral atheromatous changes. Stents in the left femoral artery. These are patent. No contrast extravasation. Veins: No obvious venous abnormality within the limitations of this arterial phase study. Review of the MIP images confirms the above findings. NON-VASCULAR Lower chest: Minimal bibasilar linear atelectasis/scarring. Enlarged heart. Hepatobiliary: Vicariously excreted contrast in the gallbladder. Unremarkable liver. Pancreas: Unremarkable. No pancreatic ductal dilatation or surrounding inflammatory changes. Spleen: Normal in size without focal abnormality. Adrenals/Urinary Tract: Normal appearing adrenal glands. Surgically absent kidneys. 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 Collapsed urinary bladder. Stomach/Bowel: Prominent, gas-filled loops of colon and prominent stool in the right colon  with no evidence of obstruction. Unremarkable stomach, small bowel and appendix. Lymphatic: No enlarged lymph nodes. Reproductive: Minimally enlarged prostate gland. Other: Large, heterogeneous hematoma in the anterior proximal left thigh. This measures approximately 13.6 x 12.5 x 8.0 cm. No extravasated contrast in  the hematoma. Musculoskeletal: Diffuse bone sclerosis. L5-S1 degenerative changes. IMPRESSION: VASCULAR 1. Large, heterogeneous hematoma in the anterior proximal left thigh. No evidence of residual or recurrent pseudoaneurysm in the hematoma. 2. Extensive atheromatous changes with greater than 90% stenosis of the proximal inferior mesenteric artery. 3. Common celiac axis and superior mesenteric artery trunk without stenosis. NON-VASCULAR 1. 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 2. Diffuse bone sclerosis, compatible with renal osteodystrophy. 3. Cardiomegaly. 4. Prominent, gas-filled loops of colon and prominent stool in the right colon without obstruction. Electronically Signed   By: Claudie Revering M.D.   On: 10/01/2019 17:44        Scheduled Meds:  allopurinol  300 mg Oral Daily   Chlorhexidine Gluconate Cloth  6 each Topical Q0600   cinacalcet  30 mg Oral QODAY   ezetimibe  10 mg Oral Daily   hydroxychloroquine  200 mg Oral BID   metoprolol tartrate  2.5 mg Intravenous Once   metoprolol tartrate  25 mg Oral BID   pantoprazole  40 mg Oral Daily   sevelamer carbonate  4,000 mg Oral TID WC   sodium chloride flush  3 mL Intravenous Q12H   Continuous Infusions:  sodium chloride       LOS: 1 day    Time spent: 25 minutes    Barb Merino, MD [facility] Pager [phone]

## 2019-10-03 ENCOUNTER — Inpatient Hospital Stay: Payer: Medicare Other

## 2019-10-03 DIAGNOSIS — I214 Non-ST elevation (NSTEMI) myocardial infarction: Secondary | ICD-10-CM

## 2019-10-03 DIAGNOSIS — I726 Aneurysm of vertebral artery: Secondary | ICD-10-CM

## 2019-10-03 LAB — COMPREHENSIVE METABOLIC PANEL
ALT: 10 U/L (ref 0–44)
AST: 36 U/L (ref 15–41)
Albumin: 2.6 g/dL — ABNORMAL LOW (ref 3.5–5.0)
Alkaline Phosphatase: 85 U/L (ref 38–126)
Anion gap: 14 (ref 5–15)
BUN: 56 mg/dL — ABNORMAL HIGH (ref 8–23)
CO2: 28 mmol/L (ref 22–32)
Calcium: 8.3 mg/dL — ABNORMAL LOW (ref 8.9–10.3)
Chloride: 93 mmol/L — ABNORMAL LOW (ref 98–111)
Creatinine, Ser: 10.81 mg/dL — ABNORMAL HIGH (ref 0.61–1.24)
GFR calc Af Amer: 5 mL/min — ABNORMAL LOW (ref >60)
GFR calc non Af Amer: 5 mL/min — ABNORMAL LOW (ref >60)
Glucose, Bld: 91 mg/dL (ref 70–99)
Potassium: 5.3 mmol/L — ABNORMAL HIGH (ref 3.5–5.1)
Sodium: 135 mmol/L (ref 135–145)
Total Bilirubin: 0.9 mg/dL (ref 0.3–1.2)
Total Protein: 5.8 g/dL — ABNORMAL LOW (ref 6.5–8.1)

## 2019-10-03 LAB — CBC WITH DIFFERENTIAL/PLATELET
Abs Immature Granulocytes: 0.02 10*3/uL (ref 0.00–0.07)
Basophils Absolute: 0 10*3/uL (ref 0.0–0.1)
Basophils Relative: 0 %
Eosinophils Absolute: 0.1 10*3/uL (ref 0.0–0.5)
Eosinophils Relative: 1 %
HCT: 21.7 % — ABNORMAL LOW (ref 39.0–52.0)
Hemoglobin: 7 g/dL — ABNORMAL LOW (ref 13.0–17.0)
Immature Granulocytes: 0 %
Lymphocytes Relative: 15 %
Lymphs Abs: 1.2 10*3/uL (ref 0.7–4.0)
MCH: 32.6 pg (ref 26.0–34.0)
MCHC: 32.3 g/dL (ref 30.0–36.0)
MCV: 100.9 fL — ABNORMAL HIGH (ref 80.0–100.0)
Monocytes Absolute: 0.4 10*3/uL (ref 0.1–1.0)
Monocytes Relative: 5 %
Neutro Abs: 6.5 10*3/uL (ref 1.7–7.7)
Neutrophils Relative %: 79 %
Platelets: 118 10*3/uL — ABNORMAL LOW (ref 150–400)
RBC: 2.15 MIL/uL — ABNORMAL LOW (ref 4.22–5.81)
RDW: 19.1 % — ABNORMAL HIGH (ref 11.5–15.5)
WBC: 8.2 10*3/uL (ref 4.0–10.5)
nRBC: 0 % (ref 0.0–0.2)

## 2019-10-03 LAB — ECHOCARDIOGRAM COMPLETE
Height: 70 in
Weight: 2960 oz

## 2019-10-03 LAB — PROTIME-INR
INR: 3 — ABNORMAL HIGH (ref 0.8–1.2)
Prothrombin Time: 30 seconds — ABNORMAL HIGH (ref 11.4–15.2)

## 2019-10-03 LAB — PREPARE RBC (CROSSMATCH)

## 2019-10-03 LAB — TROPONIN I (HIGH SENSITIVITY): Troponin I (High Sensitivity): 7139 ng/L (ref <18)

## 2019-10-03 MED ORDER — METOPROLOL TARTRATE 25 MG PO TABS
25.0000 mg | ORAL_TABLET | Freq: Three times a day (TID) | ORAL | Status: DC
  Administered 2019-10-03: 25 mg via ORAL
  Filled 2019-10-03: qty 1

## 2019-10-03 MED ORDER — SODIUM CHLORIDE 0.9% IV SOLUTION: Freq: Once | INTRAVENOUS | Status: DC

## 2019-10-03 MED ORDER — MIDODRINE HCL 5 MG PO TABS
10.0000 mg | ORAL_TABLET | ORAL | Status: DC
  Administered 2019-10-03 – 2019-10-07 (×3): 10 mg via ORAL
  Filled 2019-10-03 (×3): qty 2

## 2019-10-03 NOTE — Progress Notes (Signed)
Post HD, transfuses 1 unit PRBs

## 2019-10-03 NOTE — Progress Notes (Signed)
[facility]  ROUNDING NOTE   Subjective:     HEMODIALYSIS FLOWSHEET:  Blood Flow Rate (mL/min): (P) 400 mL/min Arterial Pressure (mmHg): (P) -200 mmHg Venous Pressure (mmHg): (P) 110 mmHg Transmembrane Pressure (mmHg): (P) 60 mmHg Ultrafiltration Rate (mL/min): (P) 720 mL/min Dialysate Flow Rate (mL/min): (P) 800 ml/min Conductivity: Machine : (P) 14 Conductivity: Machine : (P) 14 Dialysis Fluid Bolus: (P) Normal Saline Bolus Amount (mL): (P) 250 mL    Objective:  Vital signs in last 24 hours:  Temp:  [98.9 F (37.2 C)-100.7 F (38.2 C)] 100.7 F (38.2 C) (06/28 1613) Pulse Rate:  [64-137] 109 (06/28 1613) Resp:  [14-23] 18 (06/28 1613) BP: (90-154)/(60-86) 90/60 (06/28 1613) SpO2:  [82 %-100 %] 97 % (06/28 1613) Weight:  [91.7 kg] 91.7 kg (06/28 0910)  Weight change: 4.5 kg Filed Weights   10/02/19 0335 10/03/19 0500 10/03/19 0910  Weight: 88.4 kg 91.7 kg 91.7 kg    Intake/Output: I/O last 3 completed shifts: In: 240 [P.O.:240] Out: -    Intake/Output this shift:  Total I/O In: 390 [P.O.:120; Blood:270] Out: 2000 [Other:2000]  Physical Exam: General: NAD,   Head: Normocephalic, atraumatic. Moist oral mucosal membranes  Eyes: Anicteric, PERRL  Neck: Supple, trachea midline  Lungs:  Clear to auscultation  Heart: Regular rate and rhythm  Abdomen:  Soft, nontender,   Extremities:  no peripheral edema.  Neurologic: Nonfocal, moving all four extremities  Skin: No lesions  Access: Left AVF    Basic Metabolic Panel: Recent Labs  Lab 09/29/19 1504 09/29/19 1504 09/30/19 0547 09/30/19 0547 09/30/19 2023 10/01/19 0358 10/02/19 0517 10/03/19 0348  NA 138  --  139  --   --  140 138 135  K 4.1  --  5.1  --   --  4.2 5.1 5.3*  CL 96*  --  99  --   --  98 96* 93*  CO2 27  --  27  --   --  31 29 28   GLUCOSE 109*  --  110*  --   --  97 88 91  BUN 35*  --  49*  --   --  24* 42* 56*  CREATININE 7.49*  --  8.46*  --   --  5.98* 8.77* 10.81*   CALCIUM 8.9   < > 8.5*   < >  --  8.0* 8.9 8.3*  MG  --   --  2.2  --   --  1.7  --   --   PHOS  --   --   --   --  4.0  --   --   --    < > = values in this interval not displayed.    Liver Function Tests: Recent Labs  Lab 09/29/19 1504 10/01/19 0358 10/02/19 0517 10/03/19 0348  AST 18 23 48* 36  ALT 16 11 12 10   ALKPHOS 91 71 77 85  BILITOT 1.3* 1.1 1.3* 0.9  PROT 6.2* 5.5* 5.9* 5.8*  ALBUMIN 3.3* 2.7* 2.8* 2.6*   No results for input(s): LIPASE, AMYLASE in the last 168 hours. No results for input(s): AMMONIA in the last 168 hours.  CBC: Recent Labs  Lab 09/29/19 1504 09/29/19 1504 09/29/19 1936 09/29/19 1936 09/30/19 0547 09/30/19 0547 10/01/19 0358 10/01/19 1724 10/01/19 2317 10/02/19 0517 10/03/19 0348  WBC 9.1   < > 9.3  --  8.6  --  9.0  --   --  11.0* 8.2  NEUTROABS 6.8  --   --   --   --   --  7.2  --   --  8.7* 6.5  HGB 8.4*   < > 7.7*   < > 6.9*   < > 7.0* 7.9* 7.5* 8.0* 7.0*  HCT 26.9*   < > 25.1*   < > 21.5*   < > 21.5* 23.4* 22.5* 23.9* 21.7*  MCV 110.2*   < > 111.6*  --  107.0*  --  103.9*  --   --  98.8 100.9*  PLT 134*   < > 134*  --  125*  --  111*  --   --  115* 118*   < > = values in this interval not displayed.    Cardiac Enzymes: No results for input(s): CKTOTAL, CKMB, CKMBINDEX, TROPONINI in the last 168 hours.  BNP: Invalid input(s): POCBNP  CBG: Recent Labs  Lab 10/01/19 1520  GLUCAP 84    Microbiology: Results for orders placed or performed during the hospital encounter of 09/29/19  Culture, blood (x 2)     Status: None (Preliminary result)   Collection Time: 10/01/19  3:56 AM   Specimen: BLOOD RIGHT HAND  Result Value Ref Range Status   Specimen Description BLOOD RIGHT HAND  Final   Special Requests   Final    BOTTLES DRAWN AEROBIC AND ANAEROBIC Blood Culture adequate volume   Culture   Final    NO GROWTH 2 DAYS Performed at [facility], [address] [place], [place] [address]    Report Status PENDING   Incomplete  Culture, blood (x 2)     Status: None (Preliminary result)   Collection Time: 10/01/19  4:04 AM   Specimen: BLOOD  Result Value Ref Range Status   Specimen Description BLOOD RIGHT WRIST  Final   Special Requests   Final    BOTTLES DRAWN AEROBIC AND ANAEROBIC Blood Culture adequate volume   Culture   Final    NO GROWTH 2 DAYS Performed at [facility], [address], [place], [place] [address]    Report Status PENDING  Incomplete  MRSA PCR Screening     Status: None   Collection Time: 10/01/19  3:19 PM   Specimen: Nasopharyngeal  Result Value Ref Range Status   MRSA by PCR NEGATIVE NEGATIVE Final    Comment:        The GeneXpert MRSA Assay (FDA approved for NASAL specimens only), is one component of a comprehensive MRSA colonization surveillance program. It is not intended to diagnose MRSA infection nor to guide or monitor treatment for MRSA infections. Performed at [facility], [address] [place],  [address]    *Note: Due to a large number of results and/or encounters for the requested time period, some results have not been displayed. A complete set of results can be found in Results Review.    Coagulation Studies: Recent Labs    10/01/19 0358 10/02/19 0517 10/03/19 0348  LABPROT 32.3* 32.7* 30.0*  INR 3.3* 3.3* 3.0*    Urinalysis: No results for input(s): COLORURINE, LABSPEC, PHURINE, GLUCOSEU, HGBUR, BILIRUBINUR, KETONESUR, PROTEINUR, UROBILINOGEN, NITRITE, LEUKOCYTESUR in the last 72 hours.  Invalid input(s): APPERANCEUR    Imaging: No results found.   Medications:   . sodium chloride     . sodium chloride   Intravenous Once  . allopurinol  300 mg Oral Daily  . Chlorhexidine Gluconate Cloth  6 each Topical Q0600  . cinacalcet  30 mg Oral QODAY  . ezetimibe  10 mg Oral Daily  . hydroxychloroquine  200 mg  Oral BID  . metoprolol tartrate  2.5 mg Intravenous Once  . metoprolol tartrate  25 mg Oral TID  .  midodrine  10 mg Oral Once per day on Mon Wed Fri  . pantoprazole  40 mg Oral Daily  . sevelamer carbonate  4,000 mg Oral TID WC  . sodium chloride flush  3 mL Intravenous Q12H   sodium chloride, acetaminophen **OR** acetaminophen, bisacodyl, morphine injection, nitroGLYCERIN, ondansetron (ZOFRAN) IV, oxyCODONE, polyethylene glycol, simethicone, sodium chloride flush  Assessment/ Plan:  Jeremy [name] is a 65 y.o. 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 ESRD: seen and examined on hemodialysis treatment.   2. Hypotension - midodrine  3. Anemia of chronic kidney disease - PRBC transfusion with HD treatment  4. Secondary Hyperparathyroidism: - cinacalcet - sevelamer  LOS: 2 Tolbert Matheson 6/28/20215:30 PM

## 2019-10-03 NOTE — Progress Notes (Signed)
   10/03/19 1405  Assess: MEWS Score  Temp 99.1 F (37.3 C)  BP 129/85  Pulse Rate (!) 134  Resp 18  SpO2 100 %  O2 Device Room Air  Assess: MEWS Score  MEWS Temp 0  MEWS Systolic 0  MEWS Pulse 3  MEWS RR 0  MEWS LOC 0  MEWS Score 3  MEWS Score Color Yellow  Assess: if the MEWS score is Yellow or Red  Were vital signs taken at a resting state? Yes  Focused Assessment Documented focused assessment  Early Detection of Sepsis Score *See Row Information* Medium  MEWS guidelines implemented *See Row Information* Yes  Treat  MEWS Interventions Administered scheduled meds/treatments  Take Vital Signs  Increase Vital Sign Frequency  Yellow: Q 2hr X 2 then Q 4hr X 2, if remains yellow, continue Q 4hrs  Escalate  MEWS: Escalate Yellow: discuss with charge nurse/RN and consider discussing with provider and RRT  Notify: Charge Nurse/RN  Name of Charge Nurse/RN Notified Nile Riggs, RN  Date Charge Nurse/RN Notified 10/03/19  Time Charge Nurse/RN Notified 70  Notify: Provider  Provider Name/Title Dr. Lucky Cowboy  Date Provider Notified 10/03/19  Time Provider Notified 1426  Notification Type Page  Notification Reason Change in status  Response Other (Comment) (monitoring patient schedule medication provided)  Date of Provider Response 10/03/19  Time of Provider Response  (awaiting response)  Notify: Rapid Response  Name of Rapid Response RN Notified  (not contacted)  Document  Patient Outcome Other (Comment) (monitoring patient for now)  Progress note created (see row info) Yes  MD, nurse and nurse tech notified about the patients yellow MEWS score. MEWS score protocol initiated.

## 2019-10-03 NOTE — Progress Notes (Signed)
   Received notification of the patient's heart rate running in the 130s bpm.  He has permanent A. fib which has been exacerbated by his acute illness.  After discussion with his primary cardiologist we are titrating metoprolol tartrate to 25 mg 3 times daily for added rate control.

## 2019-10-03 NOTE — Progress Notes (Signed)
HD ended 

## 2019-10-03 NOTE — Progress Notes (Signed)
Progress Note  Patient Name: Jeremy [name] Date of Encounter: 10/03/2019  Primary Cardiologist: [name]  Subjective   High-sensitivity troponin peaked at 8545 and is currently downtrending.  Hemoglobin trending down to 7.0 from 8.0 24 hours prior.  Potassium 5.3.  INR 3.0.    Seen during HD. Still no angina, dyspnea, palpitations, dizziness, presyncope, or syncope.   Inpatient Medications    Scheduled Meds: . sodium chloride   Intravenous Once  . allopurinol  300 mg Oral Daily  . Chlorhexidine Gluconate Cloth  6 each Topical Q0600  . cinacalcet  30 mg Oral QODAY  . ezetimibe  10 mg Oral Daily  . hydroxychloroquine  200 mg Oral BID  . metoprolol tartrate  2.5 mg Intravenous Once  . metoprolol tartrate  25 mg Oral BID  . midodrine  10 mg Oral Once per day on Mon Wed Fri  . pantoprazole  40 mg Oral Daily  . sevelamer carbonate  4,000 mg Oral TID WC  . sodium chloride flush  3 mL Intravenous Q12H   Continuous Infusions: . sodium chloride     PRN Meds: sodium chloride, acetaminophen **OR** acetaminophen, bisacodyl, morphine injection, nitroGLYCERIN, ondansetron (ZOFRAN) IV, oxyCODONE, polyethylene glycol, simethicone, sodium chloride flush   Vital Signs    Vitals:   10/03/19 1145 10/03/19 1200 10/03/19 1215 10/03/19 1230  BP: 127/81 123/76 113/66 133/80  Pulse: (!) 123 (!) 123 64 80  Resp: 16 18 18 17   Temp:      TempSrc:      SpO2: 100% 100% 100% 100%  Weight:      Height:        Intake/Output Summary (Last 24 hours) at 10/03/2019 1248 Last data filed at 10/03/2019 1230 Gross per 24 hour  Intake 630 ml  Output --  Net 630 ml   Filed Weights   10/02/19 0335 10/03/19 0500 10/03/19 0910  Weight: 88.4 kg 91.7 kg 91.7 kg    Telemetry    Not on telemetry - Personally Reviewed  ECG    No new tracings - Personally Reviewed  Physical Exam   GEN: No acute distress.   Neck: No JVD. Cardiac: Tachycardic, no murmurs, rubs, or gallops.  Respiratory: Clear to  auscultation bilaterally.  GI: Soft, nontender, non-distended.   MS: No edema; No deformity. Neuro:  Alert and oriented x 3; Nonfocal.  Psych: Normal affect.  Labs    Chemistry Recent Labs  Lab 10/01/19 0358 10/02/19 0517 10/03/19 0348  NA 140 138 135  K 4.2 5.1 5.3*  CL 98 96* 93*  CO2 31 29 28   GLUCOSE 97 88 91  BUN 24* 42* 56*  CREATININE 5.98* 8.77* 10.81*  CALCIUM 8.0* 8.9 8.3*  PROT 5.5* 5.9* 5.8*  ALBUMIN 2.7* 2.8* 2.6*  AST 23 48* 36  ALT 11 12 10   ALKPHOS 71 77 85  BILITOT 1.1 1.3* 0.9  GFRNONAA 9* 6* 5*  GFRAA 11* 7* 5*  ANIONGAP 11 13 14      Hematology Recent Labs  Lab 10/01/19 0358 10/01/19 1724 10/01/19 2317 10/02/19 0517 10/03/19 0348  WBC 9.0  --   --  11.0* 8.2  RBC 2.07*  --   --  2.42* 2.15*  HGB 7.0*   < > 7.5* 8.0* 7.0*  HCT 21.5*   < > 22.5* 23.9* 21.7*  MCV 103.9*  --   --  98.8 100.9*  MCH 33.8  --   --  33.1 32.6  MCHC 32.6  --   --  33.5 32.3  RDW 20.0*  --   --  21.9* 19.1*  PLT 111*  --   --  115* 118*   < > = values in this interval not displayed.    Cardiac EnzymesNo results for input(s): TROPONINI in the last 168 hours. No results for input(s): TROPIPOC in the last 168 hours.   BNPNo results for input(s): BNP, PROBNP in the last 168 hours.   DDimer No results for input(s): DDIMER in the last 168 hours.   Radiology    CT Angio Abd/Pel w/ and/or w/o  Result Date: 10/01/2019 IMPRESSION: VASCULAR 1. Large, heterogeneous hematoma in the anterior proximal left thigh. No evidence of residual or recurrent pseudoaneurysm in the hematoma. 2. Extensive atheromatous changes with greater than 90% stenosis of the proximal inferior mesenteric artery. 3. Common celiac axis and superior mesenteric artery trunk without stenosis. NON-VASCULAR 1. 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 2. Diffuse bone sclerosis, compatible with renal osteodystrophy. 3. Cardiomegaly. 4. Prominent,  gas-filled loops of colon and prominent stool in the right colon without obstruction. Electronically Signed   By: Claudie Revering M.D.   On: 10/01/2019 17:44    Cardiac Studies   2D echo 10/01/2019: 1. Left ventricular ejection fraction, by estimation, is 45 to 50%. The  left ventricle has mildly decreased function. The left ventricle  demonstrates regional wall motion abnormalities (see scoring  diagram/findings for description). There is moderate  left ventricular hypertrophy. Left ventricular diastolic parameters are  indeterminate. There is moderate hypokinesis of the left ventricular,  basal-mid inferior wall and inferolateral wall.  2. Right ventricular systolic function is normal. The right ventricular  size is normal. Tricuspid regurgitation signal is inadequate for assessing  PA pressure.  3. Left atrial size was moderately dilated.  4. Right atrial size was mildly dilated.  5. The mitral valve is normal in structure. No evidence of mitral valve  regurgitation. No evidence of mitral stenosis.  6. The aortic valve has been repaired/replaced. Aortic valve  regurgitation is not visualized. No aortic stenosis is present. There is a  [employer] valve present in the aortic position. Aortic valve mean gradient  measures 11.0 mmHg.  Patient Profile     64 y.o. male with history of CAD status post three-vessel CABG in 09/2018 followed by subsequent PCI/DES to the SVG to RPDA in 07/2019, HFrEF secondary to ICM, persistent A. 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  Assessment & Plan    1. CAD involving the native coronary arteries status post CABG status post subsequent PCI to the SVG to RPDA with NSTEMI: -Never with angina -Initial HS-Tn 495 with a delta of 2763, peak of 8545, currently downtrending -NSTEMI due to demand ischemia in the setting of severe multivessel native CAD with subsequent disease of the bypass grafts with recent vascular intervention complicated by pseudoaneurysm requiring repair x 2, subsequent anemia requiring pRBC, hypotension, fever -Echo with improved LVSF as outlined above when compared to prior -Not currently a  candidate for heparin gtt with recent pseudoaneurysm/hematoma requiring repair x 2 and anemia requiring pRBC -In the setting of the the above, no plans for LHC at this time  2.  Persistent A. fib with RVR/atrial flutter: -In the setting of his acute illness/presentation and comorbid conditions  -Exacerbated by the above comorbid conditions and fever -Metoprolol for rate control -Coumadin on hold given anemia/hematoma/pseudoaneurysm, resume when able -INR currently 3 -As he improves from his acute illness, if he remains in atrial flutter consider DCCV  3.  HFrEF secondary to ICM: -Volume managed by HD -Echo this admission showed a slightly improved LVSF -At baseline, and currently, hypotension precludes GDMT outside of metoprolol   4.  PAD status post recent intervention complicated by pseudoaneurysm status post thrombin injection status post stenting: -Management per vascular surgery   5. Acute anemia on anemia of chronic disease: -Status post multiple pRBC transfusion -Management per IM  6.  Severe AS status post mechanical AVR: -Echo as above -INR currently 3, Coumadin on hold  7.    Hyperkalemia/ESRD: -HD Per nephrology   8.  HTN: -Blood pressure well controlled -Continue  metoprolol as above  9.  HLD: -Zetia  For questions or updates, please contact [place] Please consult [email or web address] for contact info under Cardiology/STEMI.    Signed, Christell Faith, PA-C [place] Pager: [phone] 10/03/2019, 12:48 PM

## 2019-10-03 NOTE — Progress Notes (Signed)
PT Cancellation Note  Patient Details Name: SAFI CULOTTA MRN: [id number] DOB: 1956/02/07   Cancelled Treatment:    Reason Eval/Treat Not Completed: Medical issues which prohibited therapy (Pt with single elevated troponin, no 2nd value to determine trend. No cadiology cconsult at this time, nor updated MD notes. Will hold, evaluate at later date time once medically appropriate.)   8:51 AM, 10/03/19 Etta Grandchild, PT, DPT Physical Therapist - [facility]  [phone] ([place])    [place] C 10/03/2019, 8:51 AM

## 2019-10-03 NOTE — Progress Notes (Signed)
HD STarted [phone]

## 2019-10-03 NOTE — Progress Notes (Signed)
PROGRESS NOTE    Jeremy [name]  NOM:[id number] DOB: July 09, 1955 DOA: 09/29/2019 PCP: Leone Haven, MD    Brief Narrative:  Patient with extensive vascular history, coronary artery disease status post CABG in 2020, PCI in 2021 April, chronic systolic heart failure ejection fraction 40% secondary to ischemic cardiomyopathy, mechanical aortic valve on Coumadin, chronic A. 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 Hemoglobin 6.9, temperature 103 after second vascular procedure and dialysis.  Hospitalist consulted. 6/28, gradually improving.  Wrote for 1 unit of PRBC.  Assessment & Plan:   Principal Problem:   Fever Active Problems:   S/P AVR (aortic valve replacement)   ESRD (end stage renal disease) (HCC)   CAD (coronary artery disease)   Essential hypertension   OSA (obstructive sleep apnea)   Atrial fibrillation (HCC)   Chronic anticoagulation   S/P angioplasty with stent   H/O mitral valve replacement with mechanical valve   Hematoma of thigh, left, initial encounter   Elevated troponin   Thigh hematoma   Postprocedural hypotension  Symptomatic anemia, acute blood loss anemia secondary to left thigh hematoma causing hypotension in the setting of coagulopathy due to Coumadin and INR more than 3. Received total 2 units of PRBC with appropriate response. Hemoglobin down to 7 today. His hemodynamics will improve with transfusion, will write 1 more unit of PRBC today.  Coumadin on hold.  Avoid reversal due to presence of mechanical heart valve. INR is 3.   Febrile episode: Etiology unknown.  Probably vascular manipulation.  No localized evidence of infection.  CT scan with no evidence of abscess.  Blood  cultures negative so far.  Already improving.  Monitoring off antibiotics.  A. fib with RVR: Due to hemodynamic disturbance.  Stabilizing.  Transfusion today.  As needed metoprolol.  Followed by cardiology.  Elevated troponin: With extensive cardiac history.  Suspect demand ischemia.  Followed by cardiology.  Troponin flattened.  ESRD on hemodialysis: Followed by nephrology.  Dialysis on a schedule.  Going for dialysis today.  Obstructive sleep apnea: Using CPAP at night.  Left femoral artery angioplasty with a stent, thrombin injection for pseudoaneurysm resulting hematoma: Conservative management.  As per surgery.  Mobilize today.  Thank you for involving [name] in this patient's care.  We will continue to follow.  I called and discussed case with patient's nephrologist today.   DVT prophylaxis: Place TED hose Start: 09/29/19 1648   Code Status: Full code Family Communication: No family at bedside today. Disposition Plan: Status is: Inpatient  Remains inpatient appropriate because:Inpatient level of care appropriate due to severity of illness   Dispo: The patient is from: Home              Anticipated d/c is to: Home              Anticipated d/c date is: 3 days              Patient currently is not medically stable to d/c.           Subjective: Patient seen and examined.  He was going to dialysis.  He himself feels well.  Left thigh swelling is subsiding.  Heart rate is still high, however acceptable.  Denies any chest pain or palpitations.  Agreeable and consented for transfusion.  Objective: Vitals:   10/03/19 1030 10/03/19 1045 10/03/19 1100 10/03/19 1115  BP: 139/82 132/81 127/75 (!) 148/79  Pulse: (!) 114 (!) 120 (!) 119 (!) 117  Resp: (!) 23 17 17 20   Temp:    98.9 F (37.2 C)  TempSrc:    Oral  SpO2: 100% 100% 100% 100%  Weight:      Height:        Intake/Output Summary (Last 24 hours) at 10/03/2019 1200 Last data filed at 10/03/2019 0900 Gross per 24 hour   Intake 360 ml  Output --  Net 360 ml   Filed Weights   10/02/19 0335 10/03/19 0500 10/03/19 0910  Weight: 88.4 kg 91.7 kg 91.7 kg    Examination:  General exam: Chronically sick looking, looking comfortable today. Respiratory system: Clear to auscultation.  No added sound. Cardiovascular system: S1 & S2 heard, mechanical aortic sound, irregularly irregular rhythm.  Gastrointestinal system: Abdomen is nondistended, soft and nontender. No organomegaly or masses felt. Normal bowel sounds heard. Central nervous system: Alert and oriented. No focal neurological deficits. Extremities:  Left upper extremity AV fistula present with thrill. Left thigh with swelling, asymmetrical more than right thigh.  Diffuse tenderness.  No increased temperature.    Data Reviewed: I have personally reviewed following labs and imaging studies  CBC: Recent Labs  Lab 09/29/19 1504 09/29/19 1504 09/29/19 1936 09/29/19 1936 09/30/19 0547 09/30/19 0547 10/01/19 0358 10/01/19 1724 10/01/19 2317 10/02/19 0517 10/03/19 0348  WBC 9.1   < > 9.3  --  8.6  --  9.0  --   --  11.0* 8.2  NEUTROABS 6.8  --   --   --   --   --  7.2  --   --  8.7* 6.5  HGB 8.4*   < > 7.7*   < > 6.9*   < > 7.0* 7.9* 7.5* 8.0* 7.0*  HCT 26.9*   < > 25.1*   < > 21.5*   < > 21.5* 23.4* 22.5* 23.9* 21.7*  MCV 110.2*   < > 111.6*  --  107.0*  --  103.9*  --   --  98.8 100.9*  PLT 134*   < > 134*  --  125*  --  111*  --   --  115* 118*   < > = values in this interval not displayed.   Basic Metabolic Panel: Recent Labs  Lab 09/29/19 1504 09/30/19 0547 09/30/19 2023 10/01/19 0358 10/02/19 0517 10/03/19 0348  NA 138 139  --  140 138 135  K 4.1 5.1  --  4.2 5.1 5.3*  CL 96* 99  --  98 96* 93*  CO2 27 27  --  31 29 28   GLUCOSE 109* 110*  --  97 88 91  BUN 35* 49*  --  24* 42* 56*  CREATININE 7.49* 8.46*  --  5.98* 8.77* 10.81*  CALCIUM 8.9 8.5*  --  8.0* 8.9 8.3*  MG  --  2.2  --  1.7  --   --   PHOS  --   --  4.0  --   --    --    GFR: Estimated Creatinine Clearance: 8.1 mL/min (A) (by C-G formula based on SCr of 10.81 mg/dL (H)). Liver Function Tests: Recent Labs  Lab 09/29/19 1504 10/01/19 0358 10/02/19 0517 10/03/19 0348  AST 18 23 48* 36  ALT 16 11 12 10   ALKPHOS 91 71 77 85  BILITOT 1.3* 1.1 1.3* 0.9  PROT 6.2* 5.5* 5.9* 5.8*  ALBUMIN 3.3* 2.7* 2.8* 2.6*   No results for input(s): LIPASE, AMYLASE in the last 168 hours. No results for input(s): AMMONIA in the last 168 hours. Coagulation Profile: Recent Labs  Lab 09/29/19 1504 09/30/19 0547 10/01/19 0358 10/02/19 0517 10/03/19 0348  INR 2.3* 2.7* 3.3* 3.3* 3.0*   Cardiac Enzymes: No results for input(s): CKTOTAL, CKMB, CKMBINDEX, TROPONINI in the last 168 hours. BNP (last 3 results) No results for input(s): PROBNP in the last 8760 hours. HbA1C: No results for input(s): HGBA1C in the last 72 hours. CBG: Recent Labs  Lab 10/01/19 1520  GLUCAP 84   Lipid Profile: No results for input(s): CHOL, HDL, LDLCALC, TRIG, CHOLHDL, LDLDIRECT in the last 72 hours. Thyroid Function Tests: No results for input(s): TSH, T4TOTAL, FREET4, T3FREE, THYROIDAB in the last 72 hours. Anemia Panel: No results for input(s): VITAMINB12, FOLATE, FERRITIN, TIBC, IRON, RETICCTPCT in the last 72 hours. Sepsis Labs: Recent Labs  Lab 10/01/19 0358 10/01/19 0635  LATICACIDVEN 1.4 1.7    Recent Results (from the past 240 hour(s))  SARS CORONAVIRUS 2 (TAT 6-24 HRS) Nasopharyngeal Nasopharyngeal Swab     Status: None   Collection Time: 09/27/19 12:31 PM   Specimen: Nasopharyngeal Swab  Result Value Ref Range Status   SARS Coronavirus 2 NEGATIVE NEGATIVE Final    Comment: (NOTE) SARS-CoV-2 target nucleic acids are NOT DETECTED.  The SARS-CoV-2 RNA is generally detectable in upper and lower respiratory specimens during the acute phase of infection. Negative results do not preclude SARS-CoV-2 infection, do not rule out co-infections with other pathogens,  and should not be used as the sole basis for treatment or other patient management decisions. Negative results must be combined with clinical observations, patient history, and epidemiological information. The expected result is Negative.  Fact Sheet for Patients: SugarRoll.be  Fact Sheet for Healthcare Providers: https://www.woods-mathews.com/  This test is not yet approved or cleared by the [place] FDA and  has been authorized for detection and/or diagnosis of SARS-CoV-2 by FDA under an Emergency Use Authorization (EUA). This EUA will remain  in effect (meaning this test can be used) for the duration of the COVID-19 declaration under Se ction 564(b)(1) of the Act, 21 U.S.C. section 360bbb-3(b)(1), unless the authorization is terminated or revoked sooner.  Performed at [facility], [place] [address], [place], [place] [address]   Culture, blood (x 2)     Status: None (Preliminary result)   Collection Time: 10/01/19  3:56 AM   Specimen: BLOOD RIGHT HAND  Result Value Ref Range Status   Specimen Description BLOOD RIGHT HAND  Final   Special Requests   Final    BOTTLES DRAWN AEROBIC AND ANAEROBIC Blood Culture adequate volume   Culture   Final    NO GROWTH 2 DAYS Performed at [facility], [address] [place], [place] [address]    Report Status PENDING  Incomplete  Culture, blood (x 2)     Status: None (Preliminary result)   Collection Time: 10/01/19  4:04 AM   Specimen: BLOOD  Result Value Ref Range Status   Specimen Description BLOOD RIGHT WRIST  Final   Special Requests   Final    BOTTLES DRAWN AEROBIC AND ANAEROBIC Blood Culture adequate volume   Culture   Final    NO GROWTH 2 DAYS Performed at [facility], [address] [place], [place] [address]    Report Status PENDING  Incomplete  MRSA PCR Screening  Status: None   Collection Time: 10/01/19  3:19 PM   Specimen: Nasopharyngeal  Result  Value Ref Range Status   MRSA by PCR NEGATIVE NEGATIVE Final    Comment:        The GeneXpert MRSA Assay (FDA approved for NASAL specimens only), is one component of a comprehensive MRSA colonization surveillance program. It is not intended to diagnose MRSA infection nor to guide or monitor treatment for MRSA infections. Performed at [facility], [address] [place], [place] [address]          Radiology Studies: ECHOCARDIOGRAM COMPLETE  Result Date: 10/03/2019    ECHOCARDIOGRAM REPORT   Patient Name:   Jeremy [name] Date of Exam: 10/01/2019 Medical Rec #:  [id number]   Height:       70.0 in Accession #:    [id number]  Weight:       185.0 lb Date of Birth:  1956-01-27   BSA:          2.019 m Patient Age:    7 years    BP:           108/54 mmHg Patient Gender: M           HR:           109 bpm. Exam Location:  [facility] Procedure: 2D Echo Indications:     Elevated Troponin  History:         Patient has prior history of Echocardiogram examinations, most                  recent 05/24/2019.                  Aortic Valve: St. Jude valve is present in the aortic position.  Sonographer:     Arville Go RDCS Referring Phys:  [id number] [place] Diagnosing Phys: Kathlyn Sacramento MD IMPRESSIONS  1. Left ventricular ejection fraction, by estimation, is 45 to 50%. The left ventricle has mildly decreased function. The left ventricle demonstrates regional wall motion abnormalities (see scoring diagram/findings for description). There is moderate left ventricular hypertrophy. Left ventricular diastolic parameters are indeterminate. There is moderate hypokinesis of the left ventricular, basal-mid inferior wall and inferolateral wall.  2. Right ventricular systolic function is normal. The right ventricular size is normal. Tricuspid regurgitation signal is inadequate for assessing PA pressure.  3. Left atrial size was moderately dilated.  4. Right atrial size was mildly dilated.  5. The mitral valve is  normal in structure. No evidence of mitral valve regurgitation. No evidence of mitral stenosis.  6. The aortic valve has been repaired/replaced. Aortic valve regurgitation is not visualized. No aortic stenosis is present. There is a St. Jude valve present in the aortic position. Aortic valve mean gradient measures 11.0 mmHg. FINDINGS  Left Ventricle: Left ventricular ejection fraction, by estimation, is 45 to 50%. The left ventricle has mildly decreased function. The left ventricle demonstrates regional wall motion abnormalities. Moderate hypokinesis of the left ventricular, basal-mid inferior wall and inferolateral wall. The left ventricular internal cavity size was normal in size. There is moderate left ventricular hypertrophy. Left ventricular diastolic parameters are indeterminate. Right Ventricle: The right ventricular size is normal. No increase in right ventricular wall thickness. Right ventricular systolic function is normal. Tricuspid regurgitation signal is inadequate for assessing PA pressure. Left Atrium: Left atrial size was moderately dilated. Right Atrium: Right atrial size was mildly dilated. Pericardium: There is no evidence of pericardial effusion. Mitral Valve: The  mitral valve is normal in structure. Normal mobility of the mitral valve leaflets. Moderate mitral annular calcification. No evidence of mitral valve regurgitation. No evidence of mitral valve stenosis. MV peak gradient, 19.7 mmHg. The mean mitral valve gradient is 8.0 mmHg. Tricuspid Valve: The tricuspid valve is normal in structure. Tricuspid valve regurgitation is trivial. No evidence of tricuspid stenosis. Aortic Valve: The aortic valve has been repaired/replaced. Aortic valve regurgitation is not visualized. No aortic stenosis is present. Aortic valve mean gradient measures 11.0 mmHg. Aortic valve peak gradient measures 16.1 mmHg. Aortic valve area, by VTI measures 1.36 cm. There is a St. Jude valve present in the aortic position.  Pulmonic Valve: The pulmonic valve was normal in structure. Pulmonic valve regurgitation is mild. No evidence of pulmonic stenosis. Aorta: The aortic root is normal in size and structure. Venous: The inferior vena cava was not well visualized. IAS/Shunts: No atrial level shunt detected by color flow Doppler.  LEFT VENTRICLE PLAX 2D LVIDd:         5.51 cm  Diastology LVIDs:         3.91 cm  LV e' lateral:   9.36 cm/s LV PW:         1.35 cm  LV E/e' lateral: 19.1 LV IVS:        1.40 cm  LV e' medial:    5.77 cm/s LVOT diam:     2.00 cm  LV E/e' medial:  31.0 LV SV:         47 LV SV Index:   23 LVOT Area:     3.14 cm  RIGHT VENTRICLE RV Basal diam:  3.95 cm TAPSE (M-mode): 1.5 cm LEFT ATRIUM            Index       RIGHT ATRIUM           Index LA diam:      5.20 cm  2.58 cm/m  RA Area:     20.40 cm LA Vol (A2C): 87.0 ml  43.08 ml/m RA Volume:   58.30 ml  28.87 ml/m LA Vol (A4C): 109.0 ml 53.98 ml/m  AORTIC VALVE                    PULMONIC VALVE AV Area (Vmax):    1.50 cm     PV Vmax:       1.32 m/s AV Area (Vmean):   1.31 cm     PV Peak grad:  7.0 mmHg AV Area (VTI):     1.36 cm AV Vmax:           200.50 cm/s AV Vmean:          153.000 cm/s AV VTI:            0.343 m AV Peak Grad:      16.1 mmHg AV Mean Grad:      11.0 mmHg LVOT Vmax:         96.00 cm/s LVOT Vmean:        64.000 cm/s LVOT VTI:          0.149 m LVOT/AV VTI ratio: 0.43  AORTA Ao Root diam: 3.40 cm Ao Asc diam:  3.20 cm MITRAL VALVE                TRICUSPID VALVE MV Area (PHT): 4.89 cm     TV Peak grad:   26.2 mmHg MV Peak grad:  19.7 mmHg    TV Vmax:  2.56 m/s MV Mean grad:  8.0 mmHg MV Vmax:       2.22 m/s     SHUNTS MV Vmean:      123.0 cm/s   Systemic VTI:  0.15 m MV Decel Time: 155 msec     Systemic Diam: 2.00 cm MV E velocity: 179.00 cm/s Kathlyn Sacramento MD Electronically signed by Kathlyn Sacramento MD Signature Date/Time: 10/03/2019/7:54:03 AM    Final    CT Angio Abd/Pel w/ and/or w/o  Result Date: 10/01/2019 CLINICAL DATA:   Recently treated left femoral artery pseudoaneurysm. The aneurysm was treated with covered stents. There was a large heterogeneous hematoma in the left thigh on a recent left lower extremity arterial duplex examination, earlier today. There was no evidence of pseudoaneurysm on that examination. EXAM: CTA ABDOMEN AND PELVIS WITHOUT AND WITH CONTRAST TECHNIQUE: Multidetector CT imaging of the abdomen and pelvis was performed using the standard protocol during bolus administration of intravenous contrast. Multiplanar reconstructed images and MIPs were obtained and reviewed to evaluate the vascular anatomy. CONTRAST:  143mL OMNIPAQUE IOHEXOL 350 MG/ML SOLN COMPARISON:  Abdomen and pelvis CT dated 03/02/2019. Limited left lower extremity arterial duplex ultrasound obtained earlier today. FINDINGS: VASCULAR Aorta: Extensive atheromatous arterial calcifications without aneurysm or dissection. Celiac: The celiac axis and superior mesenteric artery arise from a common trunk. The origin has a normal appearance. Peripheral arterial calcifications are noted. SMA: Arises from a common celiac axis and superior mesenteric artery trunk. Small amount of atheromatous changes proximally without significant luminal stenosis. Renals: Dense calcifications proximally. The remainder of the renal arteries are surgically absent, along with the kidneys. IMA: Atheromatous changes proximally with greater than 90% stenosis. Inflow: Extensive atheromatous changes without significant areas of stenosis. Proximal Outflow: Bilateral femoral atheromatous changes. Stents in the left femoral artery. These are patent. No contrast extravasation. Veins: No obvious venous abnormality within the limitations of this arterial phase study. Review of the MIP images confirms the above findings. NON-VASCULAR Lower chest: Minimal bibasilar linear atelectasis/scarring. Enlarged heart. Hepatobiliary: Vicariously excreted contrast in the gallbladder. Unremarkable  liver. Pancreas: Unremarkable. No pancreatic ductal dilatation or surrounding inflammatory changes. Spleen: Normal in size without focal abnormality. Adrenals/Urinary Tract: Normal appearing adrenal glands. Surgically absent kidneys. 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 Collapsed urinary bladder. Stomach/Bowel: Prominent, gas-filled loops of colon and prominent stool in the right colon with no evidence of obstruction. Unremarkable stomach, small bowel and appendix. Lymphatic: No enlarged lymph nodes. Reproductive: Minimally enlarged prostate gland. Other: Large, heterogeneous hematoma in the anterior proximal left thigh. This measures approximately 13.6 x 12.5 x 8.0 cm. No extravasated contrast in the hematoma. Musculoskeletal: Diffuse bone sclerosis. L5-S1 degenerative changes. IMPRESSION: VASCULAR 1. Large, heterogeneous hematoma in the anterior proximal left thigh. No evidence of residual or recurrent pseudoaneurysm in the hematoma. 2. Extensive atheromatous changes with greater than 90% stenosis of the proximal inferior mesenteric artery. 3. Common celiac axis and superior mesenteric artery trunk without stenosis. NON-VASCULAR 1. 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 2. Diffuse bone sclerosis, compatible with renal osteodystrophy. 3. Cardiomegaly. 4. Prominent, gas-filled loops of colon and prominent stool in the right colon without obstruction. Electronically Signed   By: Claudie Revering M.D.   On: 10/01/2019 17:44        Scheduled Meds: . sodium chloride   Intravenous Once  . allopurinol  300 mg Oral Daily  . Chlorhexidine Gluconate Cloth  6 each Topical Q0600  . cinacalcet  30 mg Oral  QODAY  . ezetimibe  10 mg Oral Daily  . hydroxychloroquine  200 mg Oral BID  . metoprolol tartrate  2.5 mg Intravenous Once  . metoprolol tartrate  25 mg Oral BID  . midodrine  10 mg Oral Once per  day on Mon Wed Fri  . pantoprazole  40 mg Oral Daily  . sevelamer carbonate  4,000 mg Oral TID WC  . sodium chloride flush  3 mL Intravenous Q12H   Continuous Infusions: . sodium chloride       LOS: 2 days    Time spent: 25 minutes    Barb Merino, MD [facility] Pager [phone]

## 2019-10-03 NOTE — Progress Notes (Signed)
   10/03/19 1613  Assess: MEWS Score  Temp (!) 100.7 F (38.2 C)  BP 90/60  Pulse Rate (!) 109  Resp 18  SpO2 97 %  O2 Device Room Air  Assess: MEWS Score  MEWS Temp 1  MEWS Systolic 1  MEWS Pulse 1  MEWS RR 0  MEWS LOC 0  MEWS Score 3  MEWS Score Color Yellow  Assess: if the MEWS score is Yellow or Red  Were vital signs taken at a resting state? Yes  Focused Assessment Documented focused assessment  Early Detection of Sepsis Score *See Row Information* Medium  MEWS guidelines implemented *See Row Information* Yes  Treat  MEWS Interventions Escalated (See documentation below)  Take Vital Signs  Increase Vital Sign Frequency  Yellow: Q 2hr X 2 then Q 4hr X 2, if remains yellow, continue Q 4hrs  Escalate  MEWS: Escalate Yellow: discuss with charge nurse/RN and consider discussing with provider and RRT  Notify: Charge Nurse/RN  Name of Charge Nurse/RN Notified Nile Riggs, RN  Date Charge Nurse/RN Notified 10/03/19  Time Charge Nurse/RN Notified 1653  Notify: Provider  Provider Name/Title Dr. Lucky Cowboy  Date Provider Notified 10/03/19  Time Provider Notified 1653  Notification Type Page  Notification Reason Change in status  Response Other (Comment) (Ne orders to be placed)  Date of Provider Response 10/03/19  Time of Provider Response 1654  Notify: Rapid Response  Name of Rapid Response RN Notified  (Not contacted)  Document  Patient Outcome Other (Comment) (New orders to be placed. )  Progress note created (see row info) Yes  MD, charge nurse, nurse tech notified about MEWS score being yellow. New orders to be placed for temp of 100.7. Monitoring HR and medication to control heart rate has been adjusted.

## 2019-10-03 NOTE — Progress Notes (Signed)
[facility] Daily Progress Note   Subjective: 09/29/19:  1. Thrombin injection leftfemoral artery pseudoaneurysm with ultrasound guidance  With take back to angiography suite for:  1.Ultrasound guidance for vascular accessrightfemoral artery 2.Catheter placement into left superficial femoral and profunda femoris artery 3.Aortogram and selectiveleftlower extremity angiogramoccluding selective images of the profunda and superficial femoral arteries 4.Covered stent placement to the left superficial femoral artery with 6 mm diameter by 5 and 6 mm diameter by 2.5 cm length Viabahn stents 5.Covered stent placement to the left profunda femoris artery with 6 mm diameter by 2.5 cm length Viabahn stent 6.Extension of the stents bilaterally up into the distal common femoral artery with a 6 mm diameter by 7.5 cm length Viabahn stent in the superficial femoral artery and a 6 mm diameter by 5 cm length Viabahn stent in the profunda femoris artery going about 1 to 1-1/2 cm into the common femoral artery distally 7.StarClose closure device rightfemoral artery  Dialysis this AM - no issues.  Coumadin has been on hold since Thursday. INR this a.m. 3.0  Objective: Vitals:   10/03/19 1300 10/03/19 1315 10/03/19 1330 10/03/19 1405  BP: 138/86 (!) 154/79 (!) 145/85 129/85  Pulse: (!) 137 (!) 135 97 (!) 134  Resp: (!) 22 (!) 23 (!) 22 18  Temp:   98.9 F (37.2 C) 99.1 F (37.3 C)  TempSrc:   (P) Oral Oral  SpO2: 100% 100% 100% 100%  Weight:      Height:        Intake/Output Summary (Last 24 hours) at 10/03/2019 1432 Last data filed at 10/03/2019 1315 Gross per 24 hour  Intake 630 ml  Output 2000 ml  Net -1370 ml   Physical Exam: A&Ox3, NAD CV: RRR Pulmonary: CTA Bilaterally Abdomen: Soft, Nontender, Nondistended Vascular:             Left lower extremity: Thigh  soft.  Calf soft.  Extremities warm distally to toes.  Good capillary refill.  Hematoma noted to mid thigh.  Motor / sensory is intact.   Laboratory: CBC    Component Value Date/Time   WBC 8.2 10/03/2019 0348   HGB 7.0 (L) 10/03/2019 0348   HGB 10.0 (L) 08/30/2014 1013   HCT 21.7 (L) 10/03/2019 0348   HCT 32.2 (L) 08/30/2014 1013   PLT 118 (L) 10/03/2019 0348   PLT 125 (L) 08/30/2014 1013   BMET    Component Value Date/Time   NA 135 10/03/2019 0348   NA 143 04/14/2019 0000   K 5.3 (H) 10/03/2019 0348   CL 93 (L) 10/03/2019 0348   CO2 28 10/03/2019 0348   GLUCOSE 91 10/03/2019 0348   BUN 56 (H) 10/03/2019 0348   BUN 20 04/14/2019 0000   CREATININE 10.81 (H) 10/03/2019 0348   CALCIUM 8.3 (L) 10/03/2019 0348   CALCIUM 9.2 12/14/2015 1059   GFRNONAA 5 (L) 10/03/2019 0348   GFRAA 5 (L) 10/03/2019 0348   Assessment/Planning: The patient is a 64 year old male who presented with left femoral pseudoaneurysm status post thrombin injection status post take back to the angiography suite for hematoma status post stent placement- POD#4  1) Hbg: 7.0 this AM - Transfused 1u PRBC. Post CBC in AM. 2) INR this AM 3.0 - repeat INR in the AM. 3) Appreciate assistance in management by Cards / Neph / and hospitalist team.  Seen and examined with Dr. Ellis Parents [facility] PA-C 10/03/2019 2:32 PM

## 2019-10-04 ENCOUNTER — Encounter (INDEPENDENT_AMBULATORY_CARE_PROVIDER_SITE_OTHER): Payer: Medicare Other

## 2019-10-04 ENCOUNTER — Ambulatory Visit (INDEPENDENT_AMBULATORY_CARE_PROVIDER_SITE_OTHER): Payer: 59 | Admitting: Vascular Surgery

## 2019-10-04 DIAGNOSIS — I248 Other forms of acute ischemic heart disease: Secondary | ICD-10-CM

## 2019-10-04 DIAGNOSIS — I4892 Unspecified atrial flutter: Secondary | ICD-10-CM

## 2019-10-04 LAB — CBC
HCT: 23.6 % — ABNORMAL LOW (ref 39.0–52.0)
Hemoglobin: 7.6 g/dL — ABNORMAL LOW (ref 13.0–17.0)
MCH: 31.9 pg (ref 26.0–34.0)
MCHC: 32.2 g/dL (ref 30.0–36.0)
MCV: 99.2 fL (ref 80.0–100.0)
Platelets: 122 10*3/uL — ABNORMAL LOW (ref 150–400)
RBC: 2.38 MIL/uL — ABNORMAL LOW (ref 4.22–5.81)
RDW: 18.9 % — ABNORMAL HIGH (ref 11.5–15.5)
WBC: 7 10*3/uL (ref 4.0–10.5)
nRBC: 0 % (ref 0.0–0.2)

## 2019-10-04 LAB — TYPE AND SCREEN
ABO/RH(D): O POS
Antibody Screen: NEGATIVE
Unit division: 0

## 2019-10-04 LAB — BASIC METABOLIC PANEL
Anion gap: 14 (ref 5–15)
BUN: 39 mg/dL — ABNORMAL HIGH (ref 8–23)
CO2: 30 mmol/L (ref 22–32)
Calcium: 8.6 mg/dL — ABNORMAL LOW (ref 8.9–10.3)
Chloride: 96 mmol/L — ABNORMAL LOW (ref 98–111)
Creatinine, Ser: 7.82 mg/dL — ABNORMAL HIGH (ref 0.61–1.24)
GFR calc Af Amer: 8 mL/min — ABNORMAL LOW (ref >60)
GFR calc non Af Amer: 7 mL/min — ABNORMAL LOW (ref >60)
Glucose, Bld: 105 mg/dL — ABNORMAL HIGH (ref 70–99)
Potassium: 4.5 mmol/L (ref 3.5–5.1)
Sodium: 140 mmol/L (ref 135–145)

## 2019-10-04 LAB — BPAM RBC
Blood Product Expiration Date: 202107072359
ISSUE DATE / TIME: 202106281105
Unit Type and Rh: 9500

## 2019-10-04 LAB — MAGNESIUM: Magnesium: 2 mg/dL (ref 1.7–2.4)

## 2019-10-04 LAB — PROTIME-INR
INR: 2.2 — ABNORMAL HIGH (ref 0.8–1.2)
Prothrombin Time: 23.7 seconds — ABNORMAL HIGH (ref 11.4–15.2)

## 2019-10-04 LAB — TROPONIN I (HIGH SENSITIVITY): Troponin I (High Sensitivity): 5764 ng/L (ref <18)

## 2019-10-04 MED ORDER — WARFARIN SODIUM 1 MG PO TABS
1.0000 mg | ORAL_TABLET | Freq: Every day | ORAL | Status: DC
  Administered 2019-10-04 – 2019-10-07 (×4): 1 mg via ORAL
  Filled 2019-10-04 (×4): qty 1

## 2019-10-04 MED ORDER — WARFARIN SODIUM 1 MG PO TABS: 1.0000 mg | ORAL_TABLET | Freq: Every day | ORAL | Status: DC

## 2019-10-04 MED ORDER — METOPROLOL TARTRATE 25 MG PO TABS
25.0000 mg | ORAL_TABLET | Freq: Four times a day (QID) | ORAL | Status: DC
  Administered 2019-10-04 – 2019-10-07 (×10): 25 mg via ORAL
  Filled 2019-10-04 (×11): qty 1

## 2019-10-04 MED ORDER — WARFARIN - PHYSICIAN DOSING INPATIENT: Freq: Every day | Status: DC

## 2019-10-04 NOTE — Progress Notes (Signed)
[facility]  ROUNDING NOTE   Subjective:   Hemodialysis treatment yesterday.   Hematoma improving  Objective:  Vital signs in last 24 hours:  Temp:  [98.5 F (36.9 C)-100.7 F (38.2 C)] 98.9 F (37.2 C) (06/29 1403) Pulse Rate:  [74-127] 119 (06/29 1403) Resp:  [15-18] 15 (06/29 1403) BP: (90-147)/(60-90) 115/62 (06/29 1403) SpO2:  [97 %-100 %] 99 % (06/29 1403) Weight:  [89 kg] 89 kg (06/29 0423)  Weight change: 0 kg Filed Weights   10/03/19 0500 10/03/19 0910 10/04/19 0423  Weight: 91.7 kg 91.7 kg 89 kg    Intake/Output: I/O last 3 completed shifts: In: 19 [P.O.:120; Blood:270] Out: 2000 [Other:2000]   Intake/Output this shift:  Total I/O In: 240 [P.O.:240] Out: -   Physical Exam: General: NAD,   Head: Normocephalic, atraumatic. Moist oral mucosal membranes  Eyes: Anicteric, PERRL  Neck: Supple, trachea midline  Lungs:  Clear to auscultation  Heart: Regular rate and rhythm  Abdomen:  Soft, nontender,   Extremities:  no peripheral edema.  Neurologic: Right groin hematoma  Skin: No lesions  Access: Left AVF    Basic Metabolic Panel: Recent Labs  Lab 09/30/19 0547 09/30/19 0547 09/30/19 2023 10/01/19 0358 10/01/19 0358 10/02/19 0517 10/03/19 0348 10/04/19 0532  NA 139  --   --  140  --  138 135 140  K 5.1  --   --  4.2  --  5.1 5.3* 4.5  CL 99  --   --  98  --  96* 93* 96*  CO2 27  --   --  31  --  29 28 30   GLUCOSE 110*  --   --  97  --  88 91 105*  BUN 49*  --   --  24*  --  42* 56* 39*  CREATININE 8.46*  --   --  5.98*  --  8.77* 10.81* 7.82*  CALCIUM 8.5*   < >  --  8.0*   < > 8.9 8.3* 8.6*  MG 2.2  --   --  1.7  --   --   --  2.0  PHOS  --   --  4.0  --   --   --   --   --    < > = values in this interval not displayed.    Liver Function Tests: Recent Labs  Lab 09/29/19 1504 10/01/19 0358 10/02/19 0517 10/03/19 0348  AST 18 23 48* 36  ALT 16 11 12 10   ALKPHOS 91 71 77 85  BILITOT 1.3* 1.1 1.3* 0.9  PROT 6.2* 5.5*  5.9* 5.8*  ALBUMIN 3.3* 2.7* 2.8* 2.6*   No results for input(s): LIPASE, AMYLASE in the last 168 hours. No results for input(s): AMMONIA in the last 168 hours.  CBC: Recent Labs  Lab 09/29/19 1504 09/29/19 1936 09/30/19 0547 09/30/19 0547 10/01/19 0358 10/01/19 0358 10/01/19 1724 10/01/19 2317 10/02/19 0517 10/03/19 0348 10/04/19 0532  WBC 9.1   < > 8.6  --  9.0  --   --   --  11.0* 8.2 7.0  NEUTROABS 6.8  --   --   --  7.2  --   --   --  8.7* 6.5  --   HGB 8.4*   < > 6.9*   < > 7.0*   < > 7.9* 7.5* 8.0* 7.0* 7.6*  HCT 26.9*   < > 21.5*   < > 21.5*   < > 23.4* 22.5*  23.9* 21.7* 23.6*  MCV 110.2*   < > 107.0*  --  103.9*  --   --   --  98.8 100.9* 99.2  PLT 134*   < > 125*  --  111*  --   --   --  115* 118* 122*   < > = values in this interval not displayed.    Cardiac Enzymes: No results for input(s): CKTOTAL, CKMB, CKMBINDEX, TROPONINI in the last 168 hours.  BNP: Invalid input(s): POCBNP  CBG: Recent Labs  Lab 10/01/19 1520  GLUCAP 84    Microbiology: Results for orders placed or performed during the hospital encounter of 09/29/19  Culture, blood (x 2)     Status: None (Preliminary result)   Collection Time: 10/01/19  3:56 AM   Specimen: BLOOD RIGHT HAND  Result Value Ref Range Status   Specimen Description BLOOD RIGHT HAND  Final   Special Requests   Final    BOTTLES DRAWN AEROBIC AND ANAEROBIC Blood Culture adequate volume   Culture   Final    NO GROWTH 3 DAYS Performed at [facility], [address], [place], [place] [address]    Report Status PENDING  Incomplete  Culture, blood (x 2)     Status: None (Preliminary result)   Collection Time: 10/01/19  4:04 AM   Specimen: BLOOD  Result Value Ref Range Status   Specimen Description BLOOD RIGHT WRIST  Final   Special Requests   Final    BOTTLES DRAWN AEROBIC AND ANAEROBIC Blood Culture adequate volume   Culture   Final    NO GROWTH 3 DAYS Performed at [facility], [address], [place], [place] [address]    Report Status PENDING  Incomplete  MRSA PCR Screening     Status: None   Collection Time: 10/01/19  3:19 PM   Specimen: Nasopharyngeal  Result Value Ref Range Status   MRSA by PCR NEGATIVE NEGATIVE Final    Comment:        The GeneXpert MRSA Assay (FDA approved for NASAL specimens only), is one component of a comprehensive MRSA colonization surveillance program. It is not intended to diagnose MRSA infection nor to guide or monitor treatment for MRSA infections. Performed at [facility], [address] [place], [place] [address]   CULTURE, BLOOD (ROUTINE X 2) w Reflex to ID Panel     Status: None (Preliminary result)   Collection Time: 10/03/19  5:49 PM   Specimen: BLOOD  Result Value Ref Range Status   Specimen Description BLOOD BLOOD RIGHT HAND  Final   Special Requests   Final    BOTTLES DRAWN AEROBIC AND ANAEROBIC Blood Culture adequate volume   Culture   Final    NO GROWTH < 12 HOURS Performed at [facility], [address], [place], [place] [address]    Report Status PENDING  Incomplete  CULTURE, BLOOD (ROUTINE X 2) w Reflex to ID Panel     Status: None (Preliminary result)   Collection Time: 10/03/19  6:51 PM   Specimen: BLOOD  Result Value Ref Range Status   Specimen Description BLOOD BLOOD RIGHT HAND  Final   Special Requests   Final    BOTTLES DRAWN AEROBIC AND ANAEROBIC Blood Culture results may not be optimal due to an excessive volume of blood received in culture bottles   Culture   Final    NO GROWTH < 12 HOURS Performed at [facility], [address] [place],  [name] [address]    Report Status PENDING  Incomplete   *Note: Due to a large number of results and/or encounters for the requested time period, some results have not been displayed. A complete set of results can be found in Results Review.    Coagulation Studies: Recent Labs    10/02/19 0517 10/03/19 0348 10/04/19 0532   LABPROT 32.7* 30.0* 23.7*  INR 3.3* 3.0* 2.2*    Urinalysis: No results for input(s): COLORURINE, LABSPEC, PHURINE, GLUCOSEU, HGBUR, BILIRUBINUR, KETONESUR, PROTEINUR, UROBILINOGEN, NITRITE, LEUKOCYTESUR in the last 72 hours.  Invalid input(s): APPERANCEUR    Imaging: DG Chest Port 1 View  Result Date: 10/03/2019 CLINICAL DATA:  Fever EXAM: PORTABLE CHEST 1 VIEW COMPARISON:  10/01/2019 FINDINGS: Cardiomegaly with vascular congestion. Prior CABG. No confluent opacities or effusions. No acute bony abnormality. IMPRESSION: Cardiomegaly, vascular congestion. Electronically Signed   By: Rolm Baptise M.D.   On: 10/03/2019 17:47     Medications:    . allopurinol  300 mg Oral Daily  . Chlorhexidine Gluconate Cloth  6 each Topical Q0600  . cinacalcet  30 mg Oral QODAY  . ezetimibe  10 mg Oral Daily  . hydroxychloroquine  200 mg Oral BID  . metoprolol tartrate  2.5 mg Intravenous Once  . metoprolol tartrate  25 mg Oral Q6H  . midodrine  10 mg Oral Once per day on Mon Wed Fri  . pantoprazole  40 mg Oral Daily  . sevelamer carbonate  4,000 mg Oral TID WC  . warfarin  1 mg Oral Daily  . Warfarin - Physician Dosing Inpatient   Does not apply q1600   acetaminophen **OR** acetaminophen, bisacodyl, morphine injection, nitroGLYCERIN, ondansetron (ZOFRAN) IV, oxyCODONE, polyethylene glycol, simethicone  Assessment/ Plan:  Jeremy [name] is a 64 y.o. 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 ESRD: Continue MWF schedule  2. Hypotension - midodrine  3. Anemia of chronic kidney disease - PRBC transfusion on 6/28  4. Secondary Hyperparathyroidism: - cinacalcet - sevelamer  LOS: 3 Melisse Caetano 6/29/20214:08 PM

## 2019-10-04 NOTE — Evaluation (Signed)
Physical Therapy Evaluation Patient Details Name: Jeremy [name] MRN: [id number] DOB: 10-13-1955 Today's Date: 10/04/2019   History of Present Illness  Jeremy [name] is a 53yoM who comes to [facility] several hours after scheduled vascular procedure at left femoral artery on 09/29/19, pt reporting increasing pain and swelling of Left thigh. ESRD on HD MWF, HTN, gout, s/p aortic valve replacement, AF, HLD, CAD, CHF, OSA, anemia of CKD, hyperparathyroidism.  Clinical Impression  Pt admitted with above diagnosis. Pt currently with functional limitations due to the deficits listed below (see "PT Problem List"). Upon entry, pt in bed, awake and agreeable to participate. Supervision for transfers and AMB with RW due to acute unsteadiness in gait, all movement slow and antalgic due to scotomegaly. The pt is alert and oriented x4, pleasant, conversational, and generally a good historian. Functional mobility assessment demonstrates increased effort/time requirements, fair tolerance, and need for physical assistance, whereas the patient performed these at a higher level of independence PTA. Pt will benefit from skilled PT intervention to increase independence and safety with basic mobility in preparation for discharge to the venue listed below.       Follow Up Recommendations Supervision - Intermittent;Outpatient PT (Unable to return to cardiac rehab until 7/6 (per patient), would benefit from OPPT for LLE strengthening.)    Equipment Recommendations  Rolling walker with 5" wheels    Recommendations for Other Services       Precautions / Restrictions Precautions Precautions: None      Mobility  Bed Mobility               General bed mobility comments: seated EOB upon entry  Transfers Overall transfer level: Modified independent Equipment used: Rolling walker (2 wheeled)             General transfer comment: moving slow due to scotal irritation  Ambulation/Gait Ambulation/Gait assistance:  Supervision Gait Distance (Feet): 225 Feet Assistive device: Rolling walker (2 wheeled) Gait Pattern/deviations: WFL(Within Functional Limits)     General Gait Details: multiple gait accomodations to allow for maximal reduction in physical altercation between scrotum and thighs.  Stairs            Wheelchair Mobility    Modified Rankin (Stroke Patients Only)       Balance Overall balance assessment: Needs assistance;Mild deficits observed, not formally tested   Sitting balance-Leahy Scale: Normal     Standing balance support: Single extremity supported Standing balance-Leahy Scale: Good Standing balance comment: prefers RW for AMB at this time                             Pertinent Vitals/Pain Pain Assessment: 0-10 Pain Score: 10-Worst pain ever Pain Location: Scrotum Pain Intervention(s): Limited activity within patient's tolerance    Home Living Family/patient expects to be discharged to:: Private residence Living Arrangements: Spouse/significant other Available Help at Discharge: Family   Home Access: Level entry     Home Layout: One level;Able to live on main level with bedroom/bathroom;Laundry or work area in [employer]: None      Prior Function Level of Independence: Independent               Journalist, newspaper        Extremity/Trunk Assessment   Upper Extremity Assessment Upper Extremity Assessment: Overall WFL for tasks assessed    Lower Extremity Assessment Lower Extremity Assessment: Overall WFL for tasks assessed (LLE remains obviously more girthy at thigh level,  being followed by nursing)    Cervical / Trunk Assessment Cervical / Trunk Assessment: Kyphotic  Communication      Cognition Arousal/Alertness: Awake/alert Behavior During Therapy: WFL for tasks assessed/performed Overall Cognitive Status: Within Functional Limits for tasks assessed                                         General Comments      Exercises     Assessment/Plan    PT Assessment Patient needs continued PT services  PT Problem List Decreased strength;Decreased activity tolerance;Decreased balance;Decreased mobility       PT Treatment Interventions DME instruction;Balance training;Gait training;Stair training;Functional mobility training;Therapeutic activities;Therapeutic exercise;Patient/family education    PT Goals (Current goals can be found in the Care Plan section)  Acute Rehab PT Goals Patient Stated Goal: improve LLE strength, reduce scrotal edema PT Goal Formulation: With patient Time For Goal Achievement: 10/18/19 Potential to Achieve Goals: Good    Frequency Min 2X/week   Barriers to discharge        Co-evaluation               AM-PAC PT "6 Clicks" Mobility  Outcome Measure Help needed turning from your back to your side while in a flat bed without using bedrails?: A Little Help needed moving from lying on your back to sitting on the side of a flat bed without using bedrails?: A Little Help needed moving to and from a bed to a chair (including a wheelchair)?: A Little Help needed standing up from a chair using your arms (e.g., wheelchair or bedside chair)?: A Little Help needed to walk in hospital room?: A Little Help needed climbing 3-5 steps with a railing? : A Little 6 Click Score: 18    End of Session Equipment Utilized During Treatment: Gait belt Activity Tolerance: Patient tolerated treatment well;Patient limited by pain Patient left: in bed;with call bell/phone within reach Nurse Communication: Mobility status PT Visit Diagnosis: Other abnormalities of gait and mobility (R26.89);Difficulty in walking, not elsewhere classified (R26.2);Unsteadiness on feet (R26.81)    Time: 1779-3903 PT Time Calculation (min) (ACUTE ONLY): 16 min   Charges:   PT Evaluation $PT Eval Moderate Complexity: 1 Mod          10:01 AM, 10/04/19 Etta Grandchild, PT,  DPT Physical Therapist - [facility]  [phone] ([place])    [place] C 10/04/2019, 9:59 AM

## 2019-10-04 NOTE — Care Management Important Message (Signed)
Important Message  Patient Details  Name: Jeremy [name] MRN: [id number] Date of Birth: February 04, 1956   Medicare Important Message Given:  Yes     Dannette Barbara 10/04/2019, 11:04 AM

## 2019-10-04 NOTE — Progress Notes (Signed)
Progress Note  Patient Name: Jeremy [name] Date of Encounter: 10/04/2019  Primary Cardiologist: [name]  Subjective   High-sensitivity troponin peaked at 8545 and is down trending.  Hemoglobin 7.0 to 7.6.  Potassium 4.5.  INR 3.0-->2.2 (goal 2.5-3.5 with mechanical AVR and Afib/flutter).  T max 100.7 over the past 24 hours.   No chest pain, dyspnea, palpitations, dizziness, presyncope, syncope.  He notes some symptomatic improvement with scrotal swelling/hematoma with supportive undergarments.  He would like to discuss his scrotal swelling/reported hydrocele with urology.  Inpatient Medications    Scheduled Meds: . sodium chloride   Intravenous Once  . allopurinol  300 mg Oral Daily  . Chlorhexidine Gluconate Cloth  6 each Topical Q0600  . cinacalcet  30 mg Oral QODAY  . ezetimibe  10 mg Oral Daily  . hydroxychloroquine  200 mg Oral BID  . metoprolol tartrate  2.5 mg Intravenous Once  . metoprolol tartrate  25 mg Oral Q6H  . midodrine  10 mg Oral Once per day on Mon Wed Fri  . pantoprazole  40 mg Oral Daily  . sevelamer carbonate  4,000 mg Oral TID WC  . sodium chloride flush  3 mL Intravenous Q12H   Continuous Infusions: . sodium chloride     PRN Meds: sodium chloride, acetaminophen **OR** acetaminophen, bisacodyl, morphine injection, nitroGLYCERIN, ondansetron (ZOFRAN) IV, oxyCODONE, polyethylene glycol, simethicone, sodium chloride flush   Vital Signs    Vitals:   10/04/19 0001 10/04/19 0406 10/04/19 0419 10/04/19 0423  BP: 114/79 (!) 147/90    Pulse: 74 (!) 127 100   Resp: 17  18   Temp: 98.8 F (37.1 C) 98.5 F (36.9 C)    TempSrc: Oral Oral    SpO2: 100% 100%    Weight:    89 kg  Height:        Intake/Output Summary (Last 24 hours) at 10/04/2019 0905 Last data filed at 10/03/2019 1315 Gross per 24 hour  Intake 270 ml  Output 2000 ml  Net -1730 ml   Filed Weights   10/03/19 0500 10/03/19 0910 10/04/19 0423  Weight: 91.7 kg 91.7 kg 89 kg    Telemetry      Atrial flutter with variable AV block with ventricular rates in the 1-teens - Personally Reviewed  ECG    No new tracings - Personally Reviewed  Physical Exam   GEN: No acute distress.   Neck: No JVD. Cardiac: Tachycardic, no murmurs, rubs, or gallops.  Respiratory: Clear to auscultation bilaterally.  GI: Soft, nontender, non-distended.   MS: No edema; No deformity. Neuro:  Alert and oriented x 3; Nonfocal.  Psych: Normal affect.  Labs    Chemistry Recent Labs  Lab 10/01/19 0358 10/01/19 0358 10/02/19 0517 10/03/19 0348 10/04/19 0532  NA 140   < > 138 135 140  K 4.2   < > 5.1 5.3* 4.5  CL 98   < > 96* 93* 96*  CO2 31   < > 29 28 30   GLUCOSE 97   < > 88 91 105*  BUN 24*   < > 42* 56* 39*  CREATININE 5.98*   < > 8.77* 10.81* 7.82*  CALCIUM 8.0*   < > 8.9 8.3* 8.6*  PROT 5.5*  --  5.9* 5.8*  --   ALBUMIN 2.7*  --  2.8* 2.6*  --   AST 23  --  48* 36  --   ALT 11  --  12 10  --  ALKPHOS 71  --  77 85  --   BILITOT 1.1  --  1.3* 0.9  --   GFRNONAA 9*   < > 6* 5* 7*  GFRAA 11*   < > 7* 5* 8*  ANIONGAP 11   < > 13 14 14    < > = values in this interval not displayed.     Hematology Recent Labs  Lab 10/02/19 0517 10/03/19 0348 10/04/19 0532  WBC 11.0* 8.2 7.0  RBC 2.42* 2.15* 2.38*  HGB 8.0* 7.0* 7.6*  HCT 23.9* 21.7* 23.6*  MCV 98.8 100.9* 99.2  MCH 33.1 32.6 31.9  MCHC 33.5 32.3 32.2  RDW 21.9* 19.1* 18.9*  PLT 115* 118* 122*    Cardiac EnzymesNo results for input(s): TROPONINI in the last 168 hours. No results for input(s): TROPIPOC in the last 168 hours.   BNPNo results for input(s): BNP, PROBNP in the last 168 hours.   DDimer No results for input(s): DDIMER in the last 168 hours.   Radiology    CT Angio Abd/Pel w/ and/or w/o  Result Date: 10/01/2019 IMPRESSION: VASCULAR 1. Large, heterogeneous hematoma in the anterior proximal left thigh. No evidence of residual or recurrent pseudoaneurysm in the hematoma. 2. Extensive atheromatous changes  with greater than 90% stenosis of the proximal inferior mesenteric artery. 3. Common celiac axis and superior mesenteric artery trunk without stenosis. NON-VASCULAR 1. 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 2. Diffuse bone sclerosis, compatible with renal osteodystrophy. 3. Cardiomegaly. 4. Prominent, gas-filled loops of colon and prominent stool in the right colon without obstruction. Electronically Signed   By: Claudie Revering M.D.   On: 10/01/2019 17:44    Cardiac Studies   2D echo 10/01/2019: 1. Left ventricular ejection fraction, by estimation, is 45 to 50%. The  left ventricle has mildly decreased function. The left ventricle  demonstrates regional wall motion abnormalities (see scoring  diagram/findings for description). There is moderate  left ventricular hypertrophy. Left ventricular diastolic parameters are  indeterminate. There is moderate hypokinesis of the left ventricular,  basal-mid inferior wall and inferolateral wall.  2. Right ventricular systolic function is normal. The right ventricular  size is normal. Tricuspid regurgitation signal is inadequate for assessing  PA pressure.  3. Left atrial size was moderately dilated.  4. Right atrial size was mildly dilated.  5. The mitral valve is normal in structure. No evidence of mitral valve  regurgitation. No evidence of mitral stenosis.  6. The aortic valve has been repaired/replaced. Aortic valve  regurgitation is not visualized. No aortic stenosis is present. There is a  [employer] valve present in the aortic position. Aortic valve mean gradient  measures 11.0 mmHg.  Patient Profile     64 y.o. male with history of CAD status post three-vessel CABG in 09/2018 followed by subsequent PCI/DES to the SVG to RPDA in 07/2019, HFrEF secondary to ICM, persistent A. 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  Assessment & Plan    1.  CAD involving the native coronary arteries status post CABG status post subsequent PCI to the SVG to RPDA with NSTEMI: -Never with angina -Initial HS-Tn 495 with a delta of 2763, peak of 8545, currently downtrending -NSTEMI due to demand ischemia in the setting of severe multivessel native CAD with subsequent disease of the bypass grafts with recent vascular intervention complicated by pseudoaneurysm requiring repair x 2, subsequent anemia requiring pRBC, hypotension, fever -Echo with improved LVSF as outlined above when compared to prior -Not currently a candidate for heparin gtt with recent pseudoaneurysm/hematoma requiring repair x 2 and anemia requiring pRBC -In the setting of the the above, no plans for LHC at this time -In the context of his recent acute illness including NSTEMI, from a cardiac perspective, would recommend deferring elective procedures at this time  2.  Persistent A. fib with RVR/atrial flutter: -In the setting of his acute illness/presentation and comorbid conditions  -Exacerbated by the above comorbid conditions and fever -Ventricular rates improving -Titrate metoprolol to 25 mg q 6 hours with hold parameters for added rate control -Coumadin has been on given anemia/hematoma/pseudoaneurysm -INR now down to 2.2 with goal of 2.5-3.5 with mechanical AVR and Afib/flutter -Recommend heparin to Coumadin bridge when/if safe from a vascular perspective -It would be reasonable to have a  slightly lower INR target in the short term as he improves from his acute illness  3.  HFrEF secondary to ICM: -Volume managed by HD -Echo this admission showed a slightly improved LVSF -At baseline, and currently, hypotension precludes GDMT outside of metoprolol   4.  PAD status post recent intervention complicated by pseudoaneurysm status post thrombin injection status post stenting: -Management per vascular surgery   5. Acute anemia on anemia of chronic disease: -Status post multiple pRBC transfusion -Management per IM  6.  Severe AS status post mechanical AVR: -Echo as above -INR currently 2.2 with Coumadin on hold -Recommend heparin to Coumadin bridge when/if safely possible from a vascular perspective   7.    Hyperkalemia/ESRD: -HD Per nephrology   8.  HTN: -Blood pressure well controlled -Continue metoprolol as above  9.  HLD: -Zetia  For questions or updates, please contact [place] Please consult [email or web address] for contact info under Cardiology/STEMI.    Signed, Christell Faith, PA-C [place] Pager: [phone] 10/04/2019, 9:05 AM

## 2019-10-04 NOTE — Progress Notes (Signed)
[facility] Daily Progress Note  Subjective: 09/29/19:  1. Thrombin injection leftfemoral artery pseudoaneurysm with ultrasound guidance  With take back to angiography suite for:  1.Ultrasound guidance for vascular accessrightfemoral artery 2.Catheter placement into left superficial femoral and profunda femoris artery 3.Aortogram and selectiveleftlower extremity angiogramoccluding selective images of the profunda and superficial femoral arteries 4.Covered stent placement to the left superficial femoral artery with 6 mm diameter by 5 and 6 mm diameter by 2.5 cm length Viabahn stents 5.Covered stent placement to the left profunda femoris artery with 6 mm diameter by 2.5 cm length Viabahn stent 6.Extension of the stents bilaterally up into the distal common femoral artery with a 6 mm diameter by 7.5 cm length Viabahn stent in the superficial femoral artery and a 6 mm diameter by 5 cm length Viabahn stent in the profunda femoris artery going about 1 to 1-1/2 cm into the common femoral artery distally 7.StarClose closure device rightfemoral artery  Patient notes feeling better this afternoon.  Objective: Vitals:   10/04/19 0423 10/04/19 0900 10/04/19 1156 10/04/19 1403  BP:   132/79 115/62  Pulse:  (!) 125 (!) 115 (!) 119  Resp:    15  Temp:   99 F (37.2 C) 98.9 F (37.2 C)  TempSrc:   Oral Oral  SpO2:   100% 99%  Weight: 89 kg     Height:        Intake/Output Summary (Last 24 hours) at 10/04/2019 1544 Last data filed at 10/04/2019 0900 Gross per 24 hour  Intake 240 ml  Output --  Net 240 ml   Physical Exam: A&Ox3, NAD CV: RRR Pulmonary: CTA Bilaterally Abdomen: Soft, Nontender, Nondistended Vascular: Left lower extremity:Thigh soft. Calf soft. Extremities warm distally to toes. Good capillary refill. Hematoma noted to mid  thigh. Motor Madelyn Brunner is intact.   Laboratory: CBC    Component Value Date/Time   WBC 7.0 10/04/2019 0532   HGB 7.6 (L) 10/04/2019 0532   HGB 10.0 (L) 08/30/2014 1013   HCT 23.6 (L) 10/04/2019 0532   HCT 32.2 (L) 08/30/2014 1013   PLT 122 (L) 10/04/2019 0532   PLT 125 (L) 08/30/2014 1013   BMET    Component Value Date/Time   NA 140 10/04/2019 0532   NA 143 04/14/2019 0000   K 4.5 10/04/2019 0532   CL 96 (L) 10/04/2019 0532   CO2 30 10/04/2019 0532   GLUCOSE 105 (H) 10/04/2019 0532   BUN 39 (H) 10/04/2019 0532   BUN 20 04/14/2019 0000   CREATININE 7.82 (H) 10/04/2019 0532   CALCIUM 8.6 (L) 10/04/2019 0532   CALCIUM 9.2 12/14/2015 1059   GFRNONAA 7 (L) 10/04/2019 0532   GFRAA 8 (L) 10/04/2019 0532   Assessment/Planning: The patient is a 64 year old male who presented with left femoral pseudoaneurysm status post thrombin injection status post take back to the angiography suite for hematoma status post stent placement- POD#5  1) Hbg: 7.6 this AM - Transfused 1u PRBC yesterday. Continue to monitor CBC. 2) INR this AM 2.2 - repeat INR in the AM. Plan to restart coumadin today. Will not bridge with heparin.  3) Appreciate assistance in management by Cards / Neph / and hospitalist team. 4) Possible discharge Thurs / Friday   Discussed with Dr. Ellis Parents [facility] PA-C 10/04/2019 3:44 PM

## 2019-10-04 NOTE — Progress Notes (Signed)
PROGRESS NOTE    Jeremy [name]  FMB:[id number] DOB: Oct 01, 1955 DOA: 09/29/2019 PCP: Leone Haven, MD    Brief Narrative:  Patient with extensive vascular history, coronary artery disease status post CABG in 2020, PCI in 2021 April, chronic systolic heart failure ejection fraction 40% secondary to ischemic cardiomyopathy, mechanical aortic valve on Coumadin, chronic A. 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 Hemoglobin 6.9, temperature 103 after second vascular procedure and dialysis.  Hospitalist consulted. 6/28, gradually improving.  Wrote for 1 unit of PRBC. 6/29, stabilizing.  Hemoglobin 7.6.  Heart rate elevated but asymptomatic.  One episode of low-grade temperature.  Cultures negative.  Assessment & Plan:   Principal Problem:   Fever Active Problems:   S/P AVR (aortic valve replacement)   ESRD (end stage renal disease) (HCC)   CAD (coronary artery disease)   Essential hypertension   OSA (obstructive sleep apnea)   Atrial fibrillation (HCC)   Chronic anticoagulation   S/P angioplasty with stent   H/O mitral valve replacement with mechanical valve   Hematoma of thigh, left, initial encounter   Elevated troponin   Thigh hematoma   Postprocedural hypotension   Non-ST elevation (NSTEMI) myocardial infarction (HCC)  Symptomatic anemia, acute blood loss anemia secondary to left thigh hematoma causing hypotension in the setting of coagulopathy due to Coumadin and INR more than 3. Received total 3 units of PRBC.  Hemoglobin 7.6.  Blood loss from hematoma.   Coumadin on hold.  Avoid reversal due to presence of mechanical heart valve. INR is 2.2.  Probably stopped active bleeding.  If okay with vascular  surgery, she restart bridging with heparin with ultimate goal to start Coumadin.  Febrile episode: Etiology unknown.  Probably vascular manipulation.  No localized evidence of infection.  CT scan with no evidence of abscess.  Blood cultures negative so far.  Already improving.  Monitoring off antibiotics.  A. fib with RVR: Due to hemodynamic disturbance.  Stabilizing.  Seen by cardiology.  Restarted on metoprolol.  Elevated troponin: With extensive cardiac history.  Suspect demand ischemia.  Followed by cardiology.  Troponin flattened.  ESRD on hemodialysis: Followed by nephrology.  Dialysis on a schedule.    Obstructive sleep apnea: Using CPAP at night.  Left femoral artery angioplasty with a stent, thrombin injection for pseudoaneurysm resulting hematoma: Conservative management.  As per surgery.  Mobilize   Thank you for involving [name] in this patient's care.  We will continue to follow.  DVT prophylaxis: Place TED hose Start: 09/29/19 1648.  Therapeutic Coumadin.   Code Status: Full code Family Communication: No family at bedside today. Disposition Plan: Status is: Inpatient  Remains inpatient appropriate because:Inpatient level of care appropriate due to severity of illness   Dispo: The patient is from: Home              Anticipated d/c is to: Home              Anticipated d/c date is: 3 days              Patient currently is not medically stable to d/c.           Subjective: Patient seen and examined.  No overnight events.  Occasionally tachycardic with heart rate as high as 120-130.  Patient himself  denies any chest pain or shortness of breath.  Left thigh pain is better. He was wondering whether we can drain his hydrocele that will help him relieve pain and mobilize better.  Objective: Vitals:   10/04/19 0406 10/04/19 0419 10/04/19 0423 10/04/19 0900  BP: (!) 147/90     Pulse: (!) 127 100  (!) 125  Resp:  18    Temp: 98.5 F (36.9 C)     TempSrc: Oral       SpO2: 100%     Weight:   89 kg   Height:        Intake/Output Summary (Last 24 hours) at 10/04/2019 1149 Last data filed at 10/04/2019 0900 Gross per 24 hour  Intake 510 ml  Output 2000 ml  Net -1490 ml   Filed Weights   10/03/19 0500 10/03/19 0910 10/04/19 0423  Weight: 91.7 kg 91.7 kg 89 kg    Examination:  General exam: Comfortable on room air. Respiratory system: Clear to auscultation.  No added sound. Cardiovascular system: S1 & S2 heard, mechanical aortic sound, irregularly irregular rhythm.   Gastrointestinal system: Abdomen is nondistended, soft and nontender.  Central nervous system: Alert and oriented. No focal neurological deficits. Extremities:  Left upper extremity AV fistula present with thrill. Left thigh with swelling, receding borders.  Less tender today.  He has nontender hydrocele of both testes.    Data Reviewed: I have personally reviewed following labs and imaging studies  CBC: Recent Labs  Lab 09/29/19 1504 09/29/19 1936 09/30/19 0547 09/30/19 0547 10/01/19 0358 10/01/19 0358 10/01/19 1724 10/01/19 2317 10/02/19 0517 10/03/19 0348 10/04/19 0532  WBC 9.1   < > 8.6  --  9.0  --   --   --  11.0* 8.2 7.0  NEUTROABS 6.8  --   --   --  7.2  --   --   --  8.7* 6.5  --   HGB 8.4*   < > 6.9*   < > 7.0*   < > 7.9* 7.5* 8.0* 7.0* 7.6*  HCT 26.9*   < > 21.5*   < > 21.5*   < > 23.4* 22.5* 23.9* 21.7* 23.6*  MCV 110.2*   < > 107.0*  --  103.9*  --   --   --  98.8 100.9* 99.2  PLT 134*   < > 125*  --  111*  --   --   --  115* 118* 122*   < > = values in this interval not displayed.   Basic Metabolic Panel: Recent Labs  Lab 09/30/19 0547 09/30/19 2023 10/01/19 0358 10/02/19 0517 10/03/19 0348 10/04/19 0532  NA 139  --  140 138 135 140  K 5.1  --  4.2 5.1 5.3* 4.5  CL 99  --  98 96* 93* 96*  CO2 27  --  31 29 28 30   GLUCOSE 110*  --  97 88 91 105*  BUN 49*  --  24* 42* 56* 39*  CREATININE 8.46*  --  5.98* 8.77* 10.81* 7.82*  CALCIUM 8.5*   --  8.0* 8.9 8.3* 8.6*  MG 2.2  --  1.7  --   --  2.0  PHOS  --  4.0  --   --   --   --    GFR: Estimated Creatinine Clearance: 11 mL/min (A) (by C-G formula based on SCr of 7.82 mg/dL (H)). Liver Function Tests: Recent Labs  Lab 09/29/19 1504 10/01/19 0358 10/02/19 0517 10/03/19 0348  AST 18 23 48* 36  ALT 16 11 12 10   ALKPHOS 91 71 77 85  BILITOT 1.3* 1.1 1.3* 0.9  PROT 6.2* 5.5* 5.9* 5.8*  ALBUMIN 3.3* 2.7* 2.8* 2.6*   No results for input(s): LIPASE, AMYLASE in the last 168 hours. No results for input(s): AMMONIA in the last 168 hours. Coagulation Profile: Recent Labs  Lab 09/30/19 0547 10/01/19 0358 10/02/19 0517 10/03/19 0348 10/04/19 0532  INR 2.7* 3.3* 3.3* 3.0* 2.2*   Cardiac Enzymes: No results for input(s): CKTOTAL, CKMB, CKMBINDEX, TROPONINI in the last 168 hours. BNP (last 3 results) No results for input(s): PROBNP in the last 8760 hours. HbA1C: No results for input(s): HGBA1C in the last 72 hours. CBG: Recent Labs  Lab 10/01/19 1520  GLUCAP 84   Lipid Profile: No results for input(s): CHOL, HDL, LDLCALC, TRIG, CHOLHDL, LDLDIRECT in the last 72 hours. Thyroid Function Tests: No results for input(s): TSH, T4TOTAL, FREET4, T3FREE, THYROIDAB in the last 72 hours. Anemia Panel: No results for input(s): VITAMINB12, FOLATE, FERRITIN, TIBC, IRON, RETICCTPCT in the last 72 hours. Sepsis Labs: Recent Labs  Lab 10/01/19 0358 10/01/19 0635  LATICACIDVEN 1.4 1.7    Recent Results (from the past 240 hour(s))  SARS CORONAVIRUS 2 (TAT 6-24 HRS) Nasopharyngeal Nasopharyngeal Swab     Status: None   Collection Time: 09/27/19 12:31 PM   Specimen: Nasopharyngeal Swab  Result Value Ref Range Status   SARS Coronavirus 2 NEGATIVE NEGATIVE Final    Comment: (NOTE) SARS-CoV-2 target nucleic acids are NOT DETECTED.  The SARS-CoV-2 RNA is generally detectable in upper and lower respiratory specimens during the acute phase of infection. Negative results do not  preclude SARS-CoV-2 infection, do not rule out co-infections with other pathogens, and should not be used as the sole basis for treatment or other patient management decisions. Negative results must be combined with clinical observations, patient history, and epidemiological information. The expected result is Negative.  Fact Sheet for Patients: SugarRoll.be  Fact Sheet for Healthcare Providers: https://www.woods-mathews.com/  This test is not yet approved or cleared by the [place] FDA and  has been authorized for detection and/or diagnosis of SARS-CoV-2 by FDA under an Emergency Use Authorization (EUA). This EUA will remain  in effect (meaning this test can be used) for the duration of the COVID-19 declaration under Se ction 564(b)(1) of the Act, 21 U.S.C. section 360bbb-3(b)(1), unless the authorization is terminated or revoked sooner.  Performed at [facility], [place] [address] [place], [place] [address]   Culture, blood (x 2)     Status: None (Preliminary result)   Collection Time: 10/01/19  3:56 AM   Specimen: BLOOD RIGHT HAND  Result Value Ref Range Status   Specimen Description BLOOD RIGHT HAND  Final   Special Requests   Final    BOTTLES DRAWN AEROBIC AND ANAEROBIC Blood Culture adequate volume   Culture   Final    NO GROWTH 3 DAYS Performed at [facility], [address], [place], [place] [address]    Report Status PENDING  Incomplete  Culture, blood (x 2)     Status: None (Preliminary result)   Collection Time: 10/01/19  4:04 AM   Specimen: BLOOD  Result Value Ref Range Status   Specimen Description BLOOD RIGHT WRIST  Final   Special Requests   Final    BOTTLES DRAWN AEROBIC AND ANAEROBIC Blood Culture adequate volume   Culture   Final    NO GROWTH 3 DAYS Performed  at [facility], [address] [place], [place] [address]    Report Status PENDING  Incomplete  MRSA PCR Screening      Status: None   Collection Time: 10/01/19  3:19 PM   Specimen: Nasopharyngeal  Result Value Ref Range Status   MRSA by PCR NEGATIVE NEGATIVE Final    Comment:        The GeneXpert MRSA Assay (FDA approved for NASAL specimens only), is one component of a comprehensive MRSA colonization surveillance program. It is not intended to diagnose MRSA infection nor to guide or monitor treatment for MRSA infections. Performed at [facility], [address] [place], [place] [address]   CULTURE, BLOOD (ROUTINE X 2) w Reflex to ID Panel     Status: None (Preliminary result)   Collection Time: 10/03/19  5:49 PM   Specimen: BLOOD  Result Value Ref Range Status   Specimen Description BLOOD BLOOD RIGHT HAND  Final   Special Requests   Final    BOTTLES DRAWN AEROBIC AND ANAEROBIC Blood Culture adequate volume   Culture   Final    NO GROWTH < 12 HOURS Performed at [facility], [address] [place], [place] [address]    Report Status PENDING  Incomplete  CULTURE, BLOOD (ROUTINE X 2) w Reflex to ID Panel     Status: None (Preliminary result)   Collection Time: 10/03/19  6:51 PM   Specimen: BLOOD  Result Value Ref Range Status   Specimen Description BLOOD BLOOD RIGHT HAND  Final   Special Requests   Final    BOTTLES DRAWN AEROBIC AND ANAEROBIC Blood Culture results may not be optimal due to an excessive volume of blood received in culture bottles   Culture   Final    NO GROWTH < 12 HOURS Performed at [facility], [address], [place], [place] [address]    Report Status PENDING  Incomplete         Radiology Studies: DG Chest Port 1 View  Result Date: 10/03/2019 CLINICAL DATA:  Fever EXAM: PORTABLE CHEST 1 VIEW COMPARISON:  10/01/2019 FINDINGS: Cardiomegaly with vascular congestion. Prior CABG. No confluent opacities or effusions. No acute bony abnormality. IMPRESSION: Cardiomegaly, vascular congestion. Electronically Signed   By: Rolm Baptise M.D.    On: 10/03/2019 17:47        Scheduled Meds: . sodium chloride   Intravenous Once  . allopurinol  300 mg Oral Daily  . Chlorhexidine Gluconate Cloth  6 each Topical Q0600  . cinacalcet  30 mg Oral QODAY  . ezetimibe  10 mg Oral Daily  . hydroxychloroquine  200 mg Oral BID  . metoprolol tartrate  2.5 mg Intravenous Once  . metoprolol tartrate  25 mg Oral Q6H  . midodrine  10 mg Oral Once per day on Mon Wed Fri  . pantoprazole  40 mg Oral Daily  . sevelamer carbonate  4,000 mg Oral TID WC  . sodium chloride flush  3 mL Intravenous Q12H   Continuous Infusions: . sodium chloride       LOS: 3 days    Time spent: 25 minutes    Barb Merino, MD [facility] Pager [phone]

## 2019-10-04 NOTE — Progress Notes (Signed)
   10/04/19 1156  Assess: MEWS Score  Temp 99 F (37.2 C)  BP 132/79  Pulse Rate (!) 115  Level of Consciousness Alert  SpO2 100 %  O2 Device Room Air  Assess: MEWS Score  MEWS Temp 0  MEWS Systolic 0  MEWS Pulse 2  MEWS RR 0  MEWS LOC 0  MEWS Score 2  MEWS Score Color Yellow  Assess: if the MEWS score is Yellow or Red  Were vital signs taken at a resting state? Yes  Focused Assessment Documented focused assessment  Early Detection of Sepsis Score *See Row Information* Low  MEWS guidelines implemented *See Row Information* No, vital signs rechecked  Treat  MEWS Interventions Other (Comment) (PA notified)  Take Vital Signs  Increase Vital Sign Frequency  Yellow: Q 2hr X 2 then Q 4hr X 2, if remains yellow, continue Q 4hrs  Escalate  MEWS: Escalate Yellow: discuss with charge nurse/RN and consider discussing with provider and RRT  Notify: Charge Nurse/RN  Name of Charge Nurse/RN Notified Candelaria Celeste, RN  Date Charge Nurse/RN Notified 10/04/19  Time Charge Nurse/RN Notified 1203  Notify: Provider  Provider Name/Title Ste[facility]  Date Provider Notified 10/04/19  Time Provider Notified 1203  Notification Type Page  Notification Reason Change in status  Response Other (Comment) (Notified)  Date of Provider Response 10/04/19  Time of Provider Response 1203  Notify: Rapid Response  Name of Rapid Response RN Notified  (Not required)  Document  Patient Outcome Other (Comment) (Cariology to be notified by PA)  Progress note created (see row info) Yes  PA, charge nurse and tech notified about yellow MEWS score protocol in place.

## 2019-10-05 ENCOUNTER — Inpatient Hospital Stay: Payer: Medicare Other

## 2019-10-05 ENCOUNTER — Encounter: Payer: Self-pay | Admitting: Vascular Surgery

## 2019-10-05 LAB — BASIC METABOLIC PANEL
Anion gap: 14 (ref 5–15)
BUN: 56 mg/dL — ABNORMAL HIGH (ref 8–23)
CO2: 30 mmol/L (ref 22–32)
Calcium: 8.8 mg/dL — ABNORMAL LOW (ref 8.9–10.3)
Chloride: 95 mmol/L — ABNORMAL LOW (ref 98–111)
Creatinine, Ser: 9.96 mg/dL — ABNORMAL HIGH (ref 0.61–1.24)
GFR calc Af Amer: 6 mL/min — ABNORMAL LOW (ref >60)
GFR calc non Af Amer: 5 mL/min — ABNORMAL LOW (ref >60)
Glucose, Bld: 106 mg/dL — ABNORMAL HIGH (ref 70–99)
Potassium: 4.4 mmol/L (ref 3.5–5.1)
Sodium: 139 mmol/L (ref 135–145)

## 2019-10-05 LAB — MAGNESIUM: Magnesium: 2.1 mg/dL (ref 1.7–2.4)

## 2019-10-05 LAB — CBC
HCT: 23.5 % — ABNORMAL LOW (ref 39.0–52.0)
Hemoglobin: 7.4 g/dL — ABNORMAL LOW (ref 13.0–17.0)
MCH: 32 pg (ref 26.0–34.0)
MCHC: 31.5 g/dL (ref 30.0–36.0)
MCV: 101.7 fL — ABNORMAL HIGH (ref 80.0–100.0)
Platelets: 123 10*3/uL — ABNORMAL LOW (ref 150–400)
RBC: 2.31 MIL/uL — ABNORMAL LOW (ref 4.22–5.81)
RDW: 17.6 % — ABNORMAL HIGH (ref 11.5–15.5)
WBC: 5.8 10*3/uL (ref 4.0–10.5)
nRBC: 0 % (ref 0.0–0.2)

## 2019-10-05 LAB — PROTIME-INR
INR: 1.7 — ABNORMAL HIGH (ref 0.8–1.2)
Prothrombin Time: 19.5 seconds — ABNORMAL HIGH (ref 11.4–15.2)

## 2019-10-05 MED ORDER — IOHEXOL 350 MG/ML SOLN
125.0000 mL | Freq: Once | INTRAVENOUS | Status: AC | PRN
  Administered 2019-10-05: 125 mL via INTRAVENOUS

## 2019-10-05 MED ORDER — EPOETIN ALFA 10000 UNIT/ML IJ SOLN
10000.0000 [IU] | INTRAMUSCULAR | Status: DC
  Administered 2019-10-07: 10000 [IU] via INTRAVENOUS

## 2019-10-05 MED ORDER — EPOETIN ALFA 10000 UNIT/ML IJ SOLN
10000.0000 [IU] | Freq: Once | INTRAMUSCULAR | Status: AC
  Administered 2019-10-05: 10000 [IU] via INTRAVENOUS

## 2019-10-05 MED ORDER — MELATONIN 5 MG PO TABS
5.0000 mg | ORAL_TABLET | Freq: Every evening | ORAL | Status: DC | PRN
  Administered 2019-10-05 – 2019-10-06 (×2): 5 mg via ORAL
  Filled 2019-10-05 (×2): qty 1

## 2019-10-05 NOTE — Progress Notes (Signed)
[facility]  ROUNDING NOTE   Subjective:   Dialysis for today   Objective:  Vital signs in last 24 hours:  Temp:  [97.4 F (36.3 C)-100.7 F (38.2 C)] 97.4 F (36.3 C) (06/30 1220) Pulse Rate:  [87-121] 107 (06/30 1415) Resp:  [14-23] 17 (06/30 1415) BP: (91-154)/(58-107) 146/82 (06/30 1415) SpO2:  [100 %] 100 % (06/30 1220) Weight:  [89.4 kg] 89.4 kg (06/30 0500)  Weight change: -2.251 kg Filed Weights   10/03/19 0910 10/04/19 0423 10/05/19 0500  Weight: 91.7 kg 89 kg 89.4 kg    Intake/Output: I/O last 3 completed shifts: In: 480 [P.O.:480] Out: 0    Intake/Output this shift:  Total I/O In: 120 [P.O.:120] Out: -   Physical Exam: General: NAD,   Head: Normocephalic, atraumatic. Moist oral mucosal membranes  Eyes: Anicteric, PERRL  Neck: Supple, trachea midline  Lungs:  Clear to auscultation  Heart: Regular rate and rhythm  Abdomen:  Soft, nontender,   Extremities:  right lower extremity peripheral edema.  Neurologic: Right groin hematoma  Skin: No lesions  Access: Left AVF    Basic Metabolic Panel: Recent Labs  Lab 09/30/19 0547 09/30/19 0547 09/30/19 2023 10/01/19 0358 10/01/19 0358 10/02/19 0517 10/02/19 0517 10/03/19 0348 10/04/19 0532 10/05/19 0601  NA 139   < >  --  140  --  138  --  135 140 139  K 5.1   < >  --  4.2  --  5.1  --  5.3* 4.5 4.4  CL 99   < >  --  98  --  96*  --  93* 96* 95*  CO2 27   < >  --  31  --  29  --  28 30 30   GLUCOSE 110*   < >  --  97  --  88  --  91 105* 106*  BUN 49*   < >  --  24*  --  42*  --  56* 39* 56*  CREATININE 8.46*   < >  --  5.98*  --  8.77*  --  10.81* 7.82* 9.96*  CALCIUM 8.5*   < >  --  8.0*   < > 8.9   < > 8.3* 8.6* 8.8*  MG 2.2  --   --  1.7  --   --   --   --  2.0 2.1  PHOS  --   --  4.0  --   --   --   --   --   --   --    < > = values in this interval not displayed.    Liver Function Tests: Recent Labs  Lab 09/29/19 1504 10/01/19 0358 10/02/19 0517 10/03/19 0348  AST 18  23 48* 36  ALT 16 11 12 10   ALKPHOS 91 71 77 85  BILITOT 1.3* 1.1 1.3* 0.9  PROT 6.2* 5.5* 5.9* 5.8*  ALBUMIN 3.3* 2.7* 2.8* 2.6*   No results for input(s): LIPASE, AMYLASE in the last 168 hours. No results for input(s): AMMONIA in the last 168 hours.  CBC: Recent Labs  Lab 09/29/19 1504 09/29/19 1936 10/01/19 0358 10/01/19 1724 10/01/19 2317 10/02/19 0517 10/03/19 0348 10/04/19 0532 10/05/19 0601  WBC 9.1   < > 9.0  --   --  11.0* 8.2 7.0 5.8  NEUTROABS 6.8  --  7.2  --   --  8.7* 6.5  --   --   HGB 8.4*   < >  7.0*   < > 7.5* 8.0* 7.0* 7.6* 7.4*  HCT 26.9*   < > 21.5*   < > 22.5* 23.9* 21.7* 23.6* 23.5*  MCV 110.2*   < > 103.9*  --   --  98.8 100.9* 99.2 101.7*  PLT 134*   < > 111*  --   --  115* 118* 122* 123*   < > = values in this interval not displayed.    Cardiac Enzymes: No results for input(s): CKTOTAL, CKMB, CKMBINDEX, TROPONINI in the last 168 hours.  BNP: Invalid input(s): POCBNP  CBG: Recent Labs  Lab 10/01/19 1520  GLUCAP 84    Microbiology: Results for orders placed or performed during the hospital encounter of 09/29/19  Culture, blood (x 2)     Status: None (Preliminary result)   Collection Time: 10/01/19  3:56 AM   Specimen: BLOOD RIGHT HAND  Result Value Ref Range Status   Specimen Description BLOOD RIGHT HAND  Final   Special Requests   Final    BOTTLES DRAWN AEROBIC AND ANAEROBIC Blood Culture adequate volume   Culture   Final    NO GROWTH 4 DAYS Performed at [facility], [address] [place], [place] [address]    Report Status PENDING  Incomplete  Culture, blood (x 2)     Status: None (Preliminary result)   Collection Time: 10/01/19  4:04 AM   Specimen: BLOOD  Result Value Ref Range Status   Specimen Description BLOOD RIGHT WRIST  Final   Special Requests   Final    BOTTLES DRAWN AEROBIC AND ANAEROBIC Blood Culture adequate volume   Culture   Final    NO GROWTH 4 DAYS Performed at [facility], [address], [place], [place] [address]    Report Status PENDING  Incomplete  MRSA PCR Screening     Status: None   Collection Time: 10/01/19  3:19 PM   Specimen: Nasopharyngeal  Result Value Ref Range Status   MRSA by PCR NEGATIVE NEGATIVE Final    Comment:        The GeneXpert MRSA Assay (FDA approved for NASAL specimens only), is one component of a comprehensive MRSA colonization surveillance program. It is not intended to diagnose MRSA infection nor to guide or monitor treatment for MRSA infections. Performed at [facility], [address] [place], [place] [address]   CULTURE, BLOOD (ROUTINE X 2) w Reflex to ID Panel     Status: None (Preliminary result)   Collection Time: 10/03/19  5:49 PM   Specimen: BLOOD  Result Value Ref Range Status   Specimen Description BLOOD BLOOD RIGHT HAND  Final   Special Requests   Final    BOTTLES DRAWN AEROBIC AND ANAEROBIC Blood Culture adequate volume   Culture   Final    NO GROWTH 2 DAYS Performed at [facility], [address], [place], [place] [address]    Report Status PENDING  Incomplete  CULTURE, BLOOD (ROUTINE X 2) w Reflex to ID Panel     Status: None (Preliminary result)   Collection Time: 10/03/19  6:51 PM   Specimen: BLOOD  Result Value Ref Range Status   Specimen Description BLOOD BLOOD RIGHT HAND  Final   Special Requests   Final    BOTTLES DRAWN AEROBIC AND ANAEROBIC Blood Culture results may not be optimal due to an excessive volume of blood received in culture bottles   Culture   Final    NO GROWTH 2 DAYS  Performed at [facility], [address], [place], [place] [address]    Report Status PENDING  Incomplete   *Note: Due to a large number of results and/or encounters for the requested time period, some results have not been displayed. A complete set of results can be found in Results Review.    Coagulation Studies: Recent Labs    10/03/19 0348 10/04/19 0532 10/05/19 0601  LABPROT  30.0* 23.7* 19.5*  INR 3.0* 2.2* 1.7*    Urinalysis: No results for input(s): COLORURINE, LABSPEC, PHURINE, GLUCOSEU, HGBUR, BILIRUBINUR, KETONESUR, PROTEINUR, UROBILINOGEN, NITRITE, LEUKOCYTESUR in the last 72 hours.  Invalid input(s): APPERANCEUR    Imaging: US Venous Img Lower Bilateral (DVT)  Result Date: 10/05/2019 CLINICAL DATA:  Bilateral lower extremity edema the past week left greater than right. Patient is currently on anticoagulation. Recent vascular surgery with arterial stenting. Recent thrombin injection. Evaluate for DVT. EXAM: BILATERAL LOWER EXTREMITY VENOUS DOPPLER ULTRASOUND TECHNIQUE: Gray-scale sonography with graded compression, as well as color Doppler and duplex ultrasound were performed to evaluate the lower extremity deep venous systems from the level of the common femoral vein and including the common femoral, femoral, profunda femoral, popliteal and calf veins including the posterior tibial, peroneal and gastrocnemius veins when visible. The superficial great saphenous vein was also interrogated. Spectral Doppler was utilized to evaluate flow at rest and with distal augmentation maneuvers in the common femoral, femoral and popliteal veins. COMPARISON:  CT abdomen and pelvis-10/01/2019 FINDINGS: RIGHT LOWER EXTREMITY Common Femoral Vein: No evidence of thrombus. Normal compressibility, respiratory phasicity and response to augmentation. Saphenofemoral Junction: No evidence of thrombus. Normal compressibility and flow on color Doppler imaging. Profunda Femoral Vein: No evidence of thrombus. Normal compressibility and flow on color Doppler imaging. Femoral Vein: No evidence of thrombus. Normal compressibility, respiratory phasicity and response to augmentation. Popliteal Vein: No evidence of thrombus. Normal compressibility, respiratory phasicity and response to augmentation. Calf Veins: No evidence of thrombus. Normal compressibility and flow on color Doppler imaging.  Superficial Great Saphenous Vein: No evidence of thrombus. Normal compressibility. Venous Reflux:  None. Other Findings:  None. LEFT LOWER EXTREMITY Common Femoral Vein: No evidence of thrombus. Normal compressibility, respiratory phasicity and response to augmentation. Saphenofemoral Junction: No evidence of thrombus. Normal compressibility and flow on color Doppler imaging. Profunda Femoral Vein: No evidence of thrombus. Normal compressibility and flow on color Doppler imaging. Femoral Vein: No evidence of thrombus. Normal compressibility, respiratory phasicity and response to augmentation. Popliteal Vein: No evidence of thrombus. Normal compressibility, respiratory phasicity and response to augmentation. Calf Veins: No evidence of thrombus. Normal compressibility and flow on color Doppler imaging. Superficial Great Saphenous Vein: No evidence of thrombus. Normal compressibility. Venous Reflux:  None. Other Findings: There is a large (at least 16.0 x 12.0 x 1.4 cm) complex fluid collection within the subcutaneous tissues about the proximal aspect the left thigh. No definitive blood flow is demonstrated within this collection. Additionally, the collection does not definitively communicate with any of the adjacent vascular structures. IMPRESSION: 1. No evidence of DVT within either lower extremity. 2. Approximately 16 cm complex fluid collection within the subcutaneous tissues the proximal aspect of the left thigh, nonspecific though worrisome for a hematoma, as was better demonstrated on preceding abdominal CT performed 10/01/2019. No definitive blood flow is demonstrated within the collection and it does not appear to communicate with any of the adjacent vascular structures though this is somewhat difficult to evaluate given the large size of the collection. Electronically Signed   By:  Sandi Mariscal M.D.   On: 10/05/2019 11:44   DG Chest Port 1 View  Result Date: 10/03/2019 CLINICAL DATA:  Fever EXAM: PORTABLE  CHEST 1 VIEW COMPARISON:  10/01/2019 FINDINGS: Cardiomegaly with vascular congestion. Prior CABG. No confluent opacities or effusions. No acute bony abnormality. IMPRESSION: Cardiomegaly, vascular congestion. Electronically Signed   By: Rolm Baptise M.D.   On: 10/03/2019 17:47     Medications:    . allopurinol  300 mg Oral Daily  . Chlorhexidine Gluconate Cloth  6 each Topical Q0600  . cinacalcet  30 mg Oral QODAY  . ezetimibe  10 mg Oral Daily  . hydroxychloroquine  200 mg Oral BID  . metoprolol tartrate  2.5 mg Intravenous Once  . metoprolol tartrate  25 mg Oral Q6H  . midodrine  10 mg Oral Once per day on Mon Wed Fri  . pantoprazole  40 mg Oral Daily  . sevelamer carbonate  4,000 mg Oral TID WC  . warfarin  1 mg Oral Daily  . Warfarin - Physician Dosing Inpatient   Does not apply q1600   acetaminophen **OR** acetaminophen, bisacodyl, morphine injection, nitroGLYCERIN, ondansetron (ZOFRAN) IV, oxyCODONE, polyethylene glycol, simethicone  Assessment/ Plan:  [name] is a 64 y.o. 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 ESRD: Continue MWF schedule. Dialysis for today.  2. Hypotension - midodrine  3. Anemia of chronic kidney disease - EPO with HD treatment - PRBC transfusion on 6/28  4. Secondary Hyperparathyroidism: - cinacalcet - sevelamer  LOS: 4 Celita Aron 6/30/20212:27 PM

## 2019-10-05 NOTE — Progress Notes (Signed)
   10/05/19 1525  Hand-Off documentation  Handoff Given Given to shift RN/LPN  Report given to (Full Name) Jeralyn Ruths  Handoff Received Received from shift RN/LPN  Report received from (Full Name) Sherren Mocha RN  Vital Signs  Temp 98.3 F (36.8 C)  Temp Source Oral  Pulse Rate (!) 108  Pulse Rate Source Monitor  Resp (!) 22  BP 136/64  BP Location Right Arm  BP Method Automatic  Patient Position (if appropriate) Lying  Oxygen Therapy  SpO2 99 %  O2 Device Room Air  Pain Assessment  Pain Scale 0-10  Pain Score 0  Pre Treatment Patient Checks  Vascular access used during treatment Fistula  Hepatitis B Surface Antigen Results Negative  Date Hepatitis B Surface Antigen Drawn 09/30/19  Hemodialysis Consent Verified Yes  Hemodialysis Standing Orders Initiated Yes  ECG (Telemetry) Monitor On Yes  Prime Ordered Normal Saline  Length of  DialysisTreatment -hour(s) 3 Hour(s)  Dialyzer Elisio 17H NR  Dialysate 2K;2.5 Ca  Dialysis Anticoagulant None  Dialysate Flow Ordered 600  Blood Flow Rate Ordered 400 mL/min  Ultrafiltration Goal 2500 Liters  During Hemodialysis Assessment  Blood Flow Rate (mL/min) 200 mL/min  Arterial Pressure (mmHg) -50 mmHg  Venous Pressure (mmHg) 60 mmHg  Transmembrane Pressure (mmHg) 30 mmHg  Ultrafiltration Rate (mL/min) 70 mL/min  Dialysate Flow Rate (mL/min) 600 ml/min  Conductivity: Machine  13.4  HD Safety Checks Performed Yes  KECN 67.3 KECN  Dialysis Fluid Bolus Normal Saline  Bolus Amount (mL) 250 mL  Intra-Hemodialysis Comments Tx completed  Post-Hemodialysis Assessment  Rinseback Volume (mL) 500 mL  KECN 67.3 V  Dialyzer Clearance Clear  Duration of HD Treatment -hour(s) 3 hour(s)  Hemodialysis Intake (mL) 500 mL  UF Total -Machine (mL) 3000 mL  Net UF (mL) 2500 mL  Tolerated HD Treatment Yes  Post-Hemodialysis Comments treatment complete  AVG/AVF Arterial Site Held (minutes) 10 minutes  AVG/AVF Venous Site Held (minutes)  10 minutes  Education / Care Plan  Dialysis Education Provided Yes  Documented Education in Care Plan Yes  Outpatient Plan of Care Reviewed and on Chart Yes  Fistula / Graft Left Forearm Arteriovenous fistula  No Placement Date or Time found.   Placed prior to admission: Yes  Orientation: Left  Access Location: Forearm  Access Type: Arteriovenous fistula  Site Condition No complications  Fistula / Graft Assessment Present;Thrill;Bruit  Status Accessed  Needle Size 15g  Drainage Description None  Treatment completed

## 2019-10-05 NOTE — Progress Notes (Signed)
Hemodialysis patient known at [facility] ([place]) MWF 10:30. Patient or patient's girlfriend normally transports. Please contact me with me any dialysis placement concerns.  Elvera Bicker Dialysis Coordinator [phone]

## 2019-10-05 NOTE — Progress Notes (Signed)
   10/05/19 2120  Assess: MEWS Score  Temp 99.4 F (37.4 C)  ECG Heart Rate (!) 114  Resp 20  Level of Consciousness Alert  Assess: MEWS Score  MEWS Temp 0  MEWS Systolic 0  MEWS Pulse 2  MEWS RR 0  MEWS LOC 0  MEWS Score 2  MEWS Score Color Yellow  Assess: if the MEWS score is Yellow or Red  Were vital signs taken at a resting state? Yes  Focused Assessment Documented focused assessment  Early Detection of Sepsis Score *See Row Information* High  MEWS guidelines implemented *See Row Information* No, previously yellow, continue vital signs every 4 hours  Treat  MEWS Interventions Administered prn meds/treatments  Take Vital Signs  Increase Vital Sign Frequency   (continued on Q4 )  Escalate  MEWS: Escalate Yellow: discuss with charge nurse/RN and consider discussing with provider and RRT  Notify: Charge Nurse/RN  Name of Charge Nurse/RN Notified Marcella rN  Date Charge Nurse/RN Notified 10/05/19  Time Charge Nurse/RN Notified 2130   Pt's MEWS  has been yellow to green the past few days due to intermittent fever and tachycardia. Due and prn meds given. Continued on Q4 as no acute changes has been noted.

## 2019-10-05 NOTE — Progress Notes (Signed)
[facility] [place] NAME: York Valliant    MR#:  [id number]  DATE OF BIRTH:  [birth year]  SUBJECTIVE:  patient seen it hemodialysis. Denies any complaints. Remains afebrile.  REVIEW OF SYSTEMS:   Review of Systems  Constitutional: Negative for chills, fever and weight loss.  HENT: Negative for ear discharge, ear pain and nosebleeds.   Eyes: Negative for blurred vision, pain and discharge.  Respiratory: Negative for sputum production, shortness of breath, wheezing and stridor.   Cardiovascular: Negative for chest pain, palpitations, orthopnea and PND.  Gastrointestinal: Negative for abdominal pain, diarrhea, nausea and vomiting.  Genitourinary: Negative for frequency and urgency.  Musculoskeletal: Negative for back pain and joint pain.  Neurological: Negative for sensory change, speech change, focal weakness and weakness.  Psychiatric/Behavioral: Negative for depression and hallucinations. The patient is not nervous/anxious.    Tolerating Diet:yes Tolerating PT: ambulatory--outpt PT  DRUG ALLERGIES:   Allergies  Allergen Reactions  . Statins     Other reaction(s): Arthralgias (intolerance), Myalgias (intolerance)  Pt taking pravastatin   . Sertraline Rash    VITALS:  Blood pressure (!) 152/85, pulse (!) 119, temperature (!) 97.4 F (36.3 C), temperature source Oral, resp. rate 19, height 5\' 10"  (1.778 m), weight 89.4 kg, SpO2 100 %.  PHYSICAL EXAMINATION:   Physical Exam  GENERAL:  64 y.o.-year-old patient lying in the bed with no acute distress. Appears chronically ill EYES: Pupils equal, round, reactive to light and accommodation. No scleral icterus.   HEENT: Head atraumatic, normocephalic. Oropharynx and nasopharynx clear.  NECK:  Supple, no jugular venous distention. No thyroid enlargement, no tenderness.  LUNGS: Normal breath sounds bilaterally, no wheezing, rales, rhonchi. No use of accessory muscles of respiration.   CARDIOVASCULAR: S1, S2 normal. No murmurs, rubs, or gallops.  ABDOMEN: Soft, nontender, nondistended. Bowel sounds present. No organomegaly or mass.  EXTREMITIES: No cyanosis, clubbing or edema b/l.   HD Access left upper extremity NEUROLOGIC: Cranial nerves II through XII are intact. No focal Motor or sensory deficits b/l.   PSYCHIATRIC:  patient is alert and oriented x 3.  SKIN: No obvious rash, lesion, or ulcer.   LABORATORY PANEL:  CBC Recent Labs  Lab 10/05/19 0601  WBC 5.8  HGB 7.4*  HCT 23.5*  PLT 123*    Chemistries  Recent Labs  Lab 10/03/19 0348 10/04/19 0532 10/05/19 0601  NA 135   < > 139  K 5.3*   < > 4.4  CL 93*   < > 95*  CO2 28   < > 30  GLUCOSE 91   < > 106*  BUN 56*   < > 56*  CREATININE 10.81*   < > 9.96*  CALCIUM 8.3*   < > 8.8*  MG  --    < > 2.1  AST 36  --   --   ALT 10  --   --   ALKPHOS 85  --   --   BILITOT 0.9  --   --    < > = values in this interval not displayed.   Cardiac Enzymes No results for input(s): TROPONINI in the last 168 hours. RADIOLOGY:  US Venous Img Lower Bilateral (DVT)  Result Date: 10/05/2019 CLINICAL DATA:  Bilateral lower extremity edema the past week left greater than right. Patient is currently on anticoagulation. Recent vascular surgery with arterial stenting. Recent thrombin injection. Evaluate for DVT. EXAM: BILATERAL LOWER EXTREMITY VENOUS DOPPLER ULTRASOUND TECHNIQUE: Gray-scale sonography  with graded compression, as well as color Doppler and duplex ultrasound were performed to evaluate the lower extremity deep venous systems from the level of the common femoral vein and including the common femoral, femoral, profunda femoral, popliteal and calf veins including the posterior tibial, peroneal and gastrocnemius veins when visible. The superficial great saphenous vein was also interrogated. Spectral Doppler was utilized to evaluate flow at rest and with distal augmentation maneuvers in the common femoral, femoral and  popliteal veins. COMPARISON:  CT abdomen and pelvis-10/01/2019 FINDINGS: RIGHT LOWER EXTREMITY Common Femoral Vein: No evidence of thrombus. Normal compressibility, respiratory phasicity and response to augmentation. Saphenofemoral Junction: No evidence of thrombus. Normal compressibility and flow on color Doppler imaging. Profunda Femoral Vein: No evidence of thrombus. Normal compressibility and flow on color Doppler imaging. Femoral Vein: No evidence of thrombus. Normal compressibility, respiratory phasicity and response to augmentation. Popliteal Vein: No evidence of thrombus. Normal compressibility, respiratory phasicity and response to augmentation. Calf Veins: No evidence of thrombus. Normal compressibility and flow on color Doppler imaging. Superficial Great Saphenous Vein: No evidence of thrombus. Normal compressibility. Venous Reflux:  None. Other Findings:  None. LEFT LOWER EXTREMITY Common Femoral Vein: No evidence of thrombus. Normal compressibility, respiratory phasicity and response to augmentation. Saphenofemoral Junction: No evidence of thrombus. Normal compressibility and flow on color Doppler imaging. Profunda Femoral Vein: No evidence of thrombus. Normal compressibility and flow on color Doppler imaging. Femoral Vein: No evidence of thrombus. Normal compressibility, respiratory phasicity and response to augmentation. Popliteal Vein: No evidence of thrombus. Normal compressibility, respiratory phasicity and response to augmentation. Calf Veins: No evidence of thrombus. Normal compressibility and flow on color Doppler imaging. Superficial Great Saphenous Vein: No evidence of thrombus. Normal compressibility. Venous Reflux:  None. Other Findings: There is a large (at least 16.0 x 12.0 x 1.4 cm) complex fluid collection within the subcutaneous tissues about the proximal aspect the left thigh. No definitive blood flow is demonstrated within this collection. Additionally, the collection does not  definitively communicate with any of the adjacent vascular structures. IMPRESSION: 1. No evidence of DVT within either lower extremity. 2. Approximately 16 cm complex fluid collection within the subcutaneous tissues the proximal aspect of the left thigh, nonspecific though worrisome for a hematoma, as was better demonstrated on preceding abdominal CT performed 10/01/2019. No definitive blood flow is demonstrated within the collection and it does not appear to communicate with any of the adjacent vascular structures though this is somewhat difficult to evaluate given the large size of the collection. Electronically Signed   By: Sandi Mariscal M.D.   On: 10/05/2019 11:44   DG Chest Port 1 View  Result Date: 10/03/2019 CLINICAL DATA:  Fever EXAM: PORTABLE CHEST 1 VIEW COMPARISON:  10/01/2019 FINDINGS: Cardiomegaly with vascular congestion. Prior CABG. No confluent opacities or effusions. No acute bony abnormality. IMPRESSION: Cardiomegaly, vascular congestion. Electronically Signed   By: Rolm Baptise M.D.   On: 10/03/2019 17:47   ASSESSMENT AND PLAN:  64 y.o. Male who on 09/29/19 underwent thrombin injection of pseudoaneurysm of the left femoral artery by Vascular Surgery.  Post procedure developed left thigh hematoma requiring stent placement and hospital admission.  Late in the evening 09/30/19 developed fever, A-fib w/ RVR, and transient hypotension during Hemodialysis. Found to have NSTEMI of which Cardiology is following.   Symptomatic anemia, acute blood loss anemia secondary to left thigh hematoma causing hypotension in the setting of coagulopathy due to Coumadin and INR more than 3. -Received total 3 units of PRBC.   -  Hemoglobin 7.6.  Blood loss from hematoma.   -Coumadin now resumed per vascular.  Avoid reversal due to presence of mechanical heart valve. -INR is 1.7   -pharmacy to dose warfarin.  Febrile episode: Etiology unknown.  Probably vascular manipulation.  No localized evidence of  infection.  CT scan with no evidence of abscess.  Blood cultures negative so far.  Already improving.  Monitoring off antibiotics. -Continues to remain afebrile  A. fib with RVR: Due to hemodynamic disturbance.  Stabilizing.  Seen by cardiology.  Restarted on metoprolol.  Elevated troponin: With extensive cardiac history.  Suspect demand ischemia.  Followed by cardiology.  Troponin flattened.  ESRD on hemodialysis: Followed by nephrology.  Dialysis on a schedule.    Obstructive sleep apnea: Using CPAP at night.  Left femoral artery angioplasty with a stent, thrombin injection for pseudoaneurysm resulting hematoma: Conservative management.  As per vascular surgery.  Mobilize   Status is: Inpatient  Remains inpatient appropriate because:Ongoing diagnostic testing needed not appropriate for outpatient work up   Dispo: The patient is from: Home              Anticipated d/c is to: SNF              Anticipated d/c date is: 1 day              Patient currently is medically stable to d/c. discharge when okay by vascular       TOTAL TIME TAKING CARE OF THIS PATIENT: *25* minutes.  >50% time spent on counselling and coordination of care  Note: This dictation was prepared with Dragon dictation along with smaller phrase technology. Any transcriptional errors that result from this process are unintentional.  Fritzi Mandes M.D    [facility]   CC: Primary care physician; Leone Haven, MDPatient ID: Elicia Lamp, male   DOB: December 26, 1955, 64 y.o.   MRN: [id number]

## 2019-10-05 NOTE — Progress Notes (Signed)
Progress Note  Patient Name: Jeremy [name] Date of Encounter: 10/05/2019  [facility] Cardiologist: Kathlyn Sacramento, MD   Subjective   No acute events overnight, currently getting dialyzed.  Denies chest pain or shortness of breath.  INR 1.7 today.  Inpatient Medications    Scheduled Meds: . allopurinol  300 mg Oral Daily  . Chlorhexidine Gluconate Cloth  6 each Topical Q0600  . cinacalcet  30 mg Oral QODAY  . [START ON 10/07/2019] epoetin (EPOGEN/PROCRIT) injection  10,000 Units Intravenous Q M,W,F-HD  . ezetimibe  10 mg Oral Daily  . hydroxychloroquine  200 mg Oral BID  . metoprolol tartrate  2.5 mg Intravenous Once  . metoprolol tartrate  25 mg Oral Q6H  . midodrine  10 mg Oral Once per day on Mon Wed Fri  . pantoprazole  40 mg Oral Daily  . sevelamer carbonate  4,000 mg Oral TID WC  . warfarin  1 mg Oral Daily  . Warfarin - Physician Dosing Inpatient   Does not apply q1600   Continuous Infusions:  PRN Meds: acetaminophen **OR** acetaminophen, bisacodyl, morphine injection, nitroGLYCERIN, ondansetron (ZOFRAN) IV, oxyCODONE, polyethylene glycol, simethicone   Vital Signs    Vitals:   10/05/19 1500 10/05/19 1515 10/05/19 1525 10/05/19 1625  BP: (!) 152/66 130/62 136/64 115/70  Pulse: (!) 101 97 (!) 108 (!) 105  Resp: 11 19 (!) 22 20  Temp:   98.3 F (36.8 C) 99.4 F (37.4 C)  TempSrc:   Oral Oral  SpO2:   99% 95%  Weight:      Height:        Intake/Output Summary (Last 24 hours) at 10/05/2019 1635 Last data filed at 10/05/2019 1525 Gross per 24 hour  Intake 360 ml  Output 2500 ml  Net -2140 ml   Last 3 Weights 10/05/2019 10/04/2019 10/03/2019  Weight (lbs) 197 lb 3.2 oz 196 lb 3.4 oz 202 lb 2.6 oz  Weight (kg) 89.449 kg 89 kg 91.7 kg      Telemetry    Atrial flutter,- Personally Reviewed  ECG    No new tracing- Personally Reviewed  Physical Exam   GEN: No acute distress.   Neck: No JVD Cardiac:  Irregular, tachycardic. Respiratory: Clear  anteriorly GI: Soft, nontender, non-distended  MS: No edema; No deformity. Neuro:  Nonfocal  Psych: Normal affect   Labs    High Sensitivity Troponin:   Recent Labs  Lab 10/01/19 0358 10/01/19 0635 10/02/19 1105 10/03/19 1030 10/04/19 0532  TROPONINIHS 495* 2,763* 8,545* 7,139* 5,764*      Chemistry Recent Labs  Lab 10/01/19 0358 10/01/19 0358 10/02/19 0517 10/02/19 0517 10/03/19 0348 10/04/19 0532 10/05/19 0601  NA 140   < > 138   < > 135 140 139  K 4.2   < > 5.1   < > 5.3* 4.5 4.4  CL 98   < > 96*   < > 93* 96* 95*  CO2 31   < > 29   < > 28 30 30   GLUCOSE 97   < > 88   < > 91 105* 106*  BUN 24*   < > 42*   < > 56* 39* 56*  CREATININE 5.98*   < > 8.77*   < > 10.81* 7.82* 9.96*  CALCIUM 8.0*   < > 8.9   < > 8.3* 8.6* 8.8*  PROT 5.5*  --  5.9*  --  5.8*  --   --   ALBUMIN 2.7*  --  2.8*  --  2.6*  --   --   AST 23  --  48*  --  36  --   --   ALT 11  --  12  --  10  --   --   ALKPHOS 71  --  77  --  85  --   --   BILITOT 1.1  --  1.3*  --  0.9  --   --   GFRNONAA 9*   < > 6*   < > 5* 7* 5*  GFRAA 11*   < > 7*   < > 5* 8* 6*  ANIONGAP 11   < > 13   < > 14 14 14    < > = values in this interval not displayed.     Hematology Recent Labs  Lab 10/03/19 0348 10/04/19 0532 10/05/19 0601  WBC 8.2 7.0 5.8  RBC 2.15* 2.38* 2.31*  HGB 7.0* 7.6* 7.4*  HCT 21.7* 23.6* 23.5*  MCV 100.9* 99.2 101.7*  MCH 32.6 31.9 32.0  MCHC 32.3 32.2 31.5  RDW 19.1* 18.9* 17.6*  PLT 118* 122* 123*    BNPNo results for input(s): BNP, PROBNP in the last 168 hours.   DDimer No results for input(s): DDIMER in the last 168 hours.   Radiology    US Venous Img Lower Bilateral (DVT)  Result Date: 10/05/2019 CLINICAL DATA:  Bilateral lower extremity edema the past week left greater than right. Patient is currently on anticoagulation. Recent vascular surgery with arterial stenting. Recent thrombin injection. Evaluate for DVT. EXAM: BILATERAL LOWER EXTREMITY VENOUS DOPPLER ULTRASOUND  TECHNIQUE: Gray-scale sonography with graded compression, as well as color Doppler and duplex ultrasound were performed to evaluate the lower extremity deep venous systems from the level of the common femoral vein and including the common femoral, femoral, profunda femoral, popliteal and calf veins including the posterior tibial, peroneal and gastrocnemius veins when visible. The superficial great saphenous vein was also interrogated. Spectral Doppler was utilized to evaluate flow at rest and with distal augmentation maneuvers in the common femoral, femoral and popliteal veins. COMPARISON:  CT abdomen and pelvis-10/01/2019 FINDINGS: RIGHT LOWER EXTREMITY Common Femoral Vein: No evidence of thrombus. Normal compressibility, respiratory phasicity and response to augmentation. Saphenofemoral Junction: No evidence of thrombus. Normal compressibility and flow on color Doppler imaging. Profunda Femoral Vein: No evidence of thrombus. Normal compressibility and flow on color Doppler imaging. Femoral Vein: No evidence of thrombus. Normal compressibility, respiratory phasicity and response to augmentation. Popliteal Vein: No evidence of thrombus. Normal compressibility, respiratory phasicity and response to augmentation. Calf Veins: No evidence of thrombus. Normal compressibility and flow on color Doppler imaging. Superficial Great Saphenous Vein: No evidence of thrombus. Normal compressibility. Venous Reflux:  None. Other Findings:  None. LEFT LOWER EXTREMITY Common Femoral Vein: No evidence of thrombus. Normal compressibility, respiratory phasicity and response to augmentation. Saphenofemoral Junction: No evidence of thrombus. Normal compressibility and flow on color Doppler imaging. Profunda Femoral Vein: No evidence of thrombus. Normal compressibility and flow on color Doppler imaging. Femoral Vein: No evidence of thrombus. Normal compressibility, respiratory phasicity and response to augmentation. Popliteal Vein: No  evidence of thrombus. Normal compressibility, respiratory phasicity and response to augmentation. Calf Veins: No evidence of thrombus. Normal compressibility and flow on color Doppler imaging. Superficial Great Saphenous Vein: No evidence of thrombus. Normal compressibility. Venous Reflux:  None. Other Findings: There is a large (at least 16.0 x 12.0 x 1.4 cm) complex fluid collection within the subcutaneous tissues  about the proximal aspect the left thigh. No definitive blood flow is demonstrated within this collection. Additionally, the collection does not definitively communicate with any of the adjacent vascular structures. IMPRESSION: 1. No evidence of DVT within either lower extremity. 2. Approximately 16 cm complex fluid collection within the subcutaneous tissues the proximal aspect of the left thigh, nonspecific though worrisome for a hematoma, as was better demonstrated on preceding abdominal CT performed 10/01/2019. No definitive blood flow is demonstrated within the collection and it does not appear to communicate with any of the adjacent vascular structures though this is somewhat difficult to evaluate given the large size of the collection. Electronically Signed   By: Sandi Mariscal M.D.   On: 10/05/2019 11:44   DG Chest Port 1 View  Result Date: 10/03/2019 CLINICAL DATA:  Fever EXAM: PORTABLE CHEST 1 VIEW COMPARISON:  10/01/2019 FINDINGS: Cardiomegaly with vascular congestion. Prior CABG. No confluent opacities or effusions. No acute bony abnormality. IMPRESSION: Cardiomegaly, vascular congestion. Electronically Signed   By: Rolm Baptise M.D.   On: 10/03/2019 17:47    Cardiac Studies   2D echo 10/01/2019: 1. Left ventricular ejection fraction, by estimation, is 45 to 50%. The  left ventricle has mildly decreased function. The left ventricle  demonstrates regional wall motion abnormalities (see scoring  diagram/findings for description). There is moderate  left ventricular hypertrophy. Left  ventricular diastolic parameters are  indeterminate. There is moderate hypokinesis of the left ventricular,  basal-mid inferior wall and inferolateral wall.  2. Right ventricular systolic function is normal. The right ventricular  size is normal. Tricuspid regurgitation signal is inadequate for assessing  PA pressure.  3. Left atrial size was moderately dilated.  4. Right atrial size was mildly dilated.  5. The mitral valve is normal in structure. No evidence of mitral valve  regurgitation. No evidence of mitral stenosis.  6. The aortic valve has been repaired/replaced. Aortic valve  regurgitation is not visualized. No aortic stenosis is present. There is a  St. Jude valve present in the aortic position. Aortic valve mean gradient  measures 11.0 mmHg.  Patient Profile     64 y.o. male history of CAD/CABG, PCI with drug-eluting stent to SVG-RPDA, ischemic cardiomyopathy, persistent A. fib on Coumadin, severe AS status post mechanical aortic valve 2014, end-stage renal disease being seen due to elevated troponins  Assessment & Plan    1.  Elevated troponins -Likely due to demand ischemia in setting of multivessel CAD. -Echocardiogram with improved ejection fraction -Denies cardiac symptoms. -No left heart cath planned in light of recent pseudoaneurysm/hematoma requiring repair, anemia requiring packed red blood cells.  2.  Atrial flutter, variable block -Continue beta-blocker -Warfarin per pharmacy protocol -INR 1.7 today.  Bridge with heparin if okay per vascular surgery.  Patient has a mechanical aortic valve  3.  Ischemic cardiomyopathy, mildly reduced ejection fraction -Low normal blood pressures preventing titration of heart failure meds. -Consider switch to Toprol-XL upon discharge.  4.  End-stage renal disease on dialysis -Management as per nephrology.   Total encounter time 35 minutes  Greater than 50% was spent in counseling and coordination of care with the  patient   Signed, Kate Sable, MD  10/05/2019, 4:35 PM

## 2019-10-05 NOTE — Progress Notes (Signed)
[facility] Daily Progress Note   Subjective: 09/29/19:  1. Thrombin injection leftfemoral artery pseudoaneurysm with ultrasound guidance  With take back to angiography suite for:  1.Ultrasound guidance for vascular accessrightfemoral artery 2.Catheter placement into left superficial femoral and profunda femoris artery 3.Aortogram and selectiveleftlower extremity angiogramoccluding selective images of the profunda and superficial femoral arteries 4.Covered stent placement to the left superficial femoral artery with 6 mm diameter by 5 and 6 mm diameter by 2.5 cm length Viabahn stents 5.Covered stent placement to the left profunda femoris artery with 6 mm diameter by 2.5 cm length Viabahn stent 6.Extension of the stents bilaterally up into the distal common femoral artery with a 6 mm diameter by 7.5 cm length Viabahn stent in the superficial femoral artery and a 6 mm diameter by 5 cm length Viabahn stent in the profunda femoris artery going about 1 to 1-1/2 cm into the common femoral artery distally 7.StarClose closure device rightfemoral artery  Patient notes feeling better this afternoon.  Objective: Vitals:   10/05/19 1500 10/05/19 1515 10/05/19 1525 10/05/19 1625  BP: (!) 152/66 130/62 136/64 115/70  Pulse: (!) 101 97 (!) 108 (!) 105  Resp: 11 19 (!) 22 20  Temp:   98.3 F (36.8 C) 99.4 F (37.4 C)  TempSrc:   Oral Oral  SpO2:   99% 95%  Weight:      Height:        Intake/Output Summary (Last 24 hours) at 10/05/2019 1748 Last data filed at 10/05/2019 1525 Gross per 24 hour  Intake 360 ml  Output 2500 ml  Net -2140 ml   Physical Exam: A&Ox3, NAD CV: RRR Pulmonary: CTA Bilaterally Abdomen: Soft, Nontender, Nondistended Vascular: Left lower extremity:Thigh soft. Calf soft. Extremities warm distally to toes. Good capillary  refill. Hematoma noted to mid thigh. Motor Madelyn Brunner is intact.   Laboratory: CBC    Component Value Date/Time   WBC 5.8 10/05/2019 0601   HGB 7.4 (L) 10/05/2019 0601   HGB 10.0 (L) 08/30/2014 1013   HCT 23.5 (L) 10/05/2019 0601   HCT 32.2 (L) 08/30/2014 1013   PLT 123 (L) 10/05/2019 0601   PLT 125 (L) 08/30/2014 1013   BMET    Component Value Date/Time   NA 139 10/05/2019 0601   NA 143 04/14/2019 0000   K 4.4 10/05/2019 0601   CL 95 (L) 10/05/2019 0601   CO2 30 10/05/2019 0601   GLUCOSE 106 (H) 10/05/2019 0601   BUN 56 (H) 10/05/2019 0601   BUN 20 04/14/2019 0000   CREATININE 9.96 (H) 10/05/2019 0601   CALCIUM 8.8 (L) 10/05/2019 0601   CALCIUM 9.2 12/14/2015 1059   GFRNONAA 5 (L) 10/05/2019 0601   GFRAA 6 (L) 10/05/2019 0601   Assessment/Planning: The patient is a 64 year old male who presented with left femoral pseudoaneurysm status post thrombin injection status post take back to the angiography suite for hematoma status post stent placement- POD#6  1)Hbg stable. Monitor CBC 2) Coumadin restarted 3) Appreciate assistance in management by Cards / Neph / and hospitalist team. 4) Possible discharge Thurs / Friday   Seen and examined withDr. Ellis Parents Nashae Maudlin PA-C 10/05/2019 5:48 PM

## 2019-10-06 DIAGNOSIS — I25118 Atherosclerotic heart disease of native coronary artery with other forms of angina pectoris: Secondary | ICD-10-CM

## 2019-10-06 DIAGNOSIS — I483 Typical atrial flutter: Secondary | ICD-10-CM

## 2019-10-06 LAB — CULTURE, BLOOD (ROUTINE X 2)
Culture: NO GROWTH
Culture: NO GROWTH
Special Requests: ADEQUATE
Special Requests: ADEQUATE

## 2019-10-06 LAB — CBC
HCT: 23.1 % — ABNORMAL LOW (ref 39.0–52.0)
Hemoglobin: 7.1 g/dL — ABNORMAL LOW (ref 13.0–17.0)
MCH: 31.4 pg (ref 26.0–34.0)
MCHC: 30.7 g/dL (ref 30.0–36.0)
MCV: 102.2 fL — ABNORMAL HIGH (ref 80.0–100.0)
Platelets: 142 10*3/uL — ABNORMAL LOW (ref 150–400)
RBC: 2.26 MIL/uL — ABNORMAL LOW (ref 4.22–5.81)
RDW: 17.2 % — ABNORMAL HIGH (ref 11.5–15.5)
WBC: 5.8 10*3/uL (ref 4.0–10.5)
nRBC: 0 % (ref 0.0–0.2)

## 2019-10-06 LAB — MAGNESIUM: Magnesium: 1.8 mg/dL (ref 1.7–2.4)

## 2019-10-06 LAB — BASIC METABOLIC PANEL WITH GFR
Anion gap: 13 (ref 5–15)
BUN: 36 mg/dL — ABNORMAL HIGH (ref 8–23)
CO2: 31 mmol/L (ref 22–32)
Calcium: 9 mg/dL (ref 8.9–10.3)
Chloride: 94 mmol/L — ABNORMAL LOW (ref 98–111)
Creatinine, Ser: 7.09 mg/dL — ABNORMAL HIGH (ref 0.61–1.24)
GFR calc Af Amer: 9 mL/min — ABNORMAL LOW
GFR calc non Af Amer: 8 mL/min — ABNORMAL LOW
Glucose, Bld: 89 mg/dL (ref 70–99)
Potassium: 4 mmol/L (ref 3.5–5.1)
Sodium: 138 mmol/L (ref 135–145)

## 2019-10-06 LAB — PROTIME-INR
INR: 1.8 — ABNORMAL HIGH (ref 0.8–1.2)
Prothrombin Time: 19.9 seconds — ABNORMAL HIGH (ref 11.4–15.2)

## 2019-10-06 MED ORDER — LORATADINE 10 MG PO TABS
10.0000 mg | ORAL_TABLET | Freq: Every day | ORAL | Status: DC
  Administered 2019-10-06 – 2019-10-07 (×2): 10 mg via ORAL
  Filled 2019-10-06 (×2): qty 1

## 2019-10-06 MED ORDER — VITAMIN D 25 MCG (1000 UNIT) PO TABS
1000.0000 [IU] | ORAL_TABLET | Freq: Every day | ORAL | Status: DC
  Administered 2019-10-06: 1000 [IU] via ORAL
  Filled 2019-10-06 (×2): qty 1

## 2019-10-06 MED ORDER — RENA-VITE PO TABS
1.0000 | ORAL_TABLET | Freq: Every day | ORAL | Status: DC
  Administered 2019-10-06 – 2019-10-07 (×2): 1 via ORAL
  Filled 2019-10-06 (×2): qty 1

## 2019-10-06 MED ORDER — ZINC SULFATE 220 (50 ZN) MG PO CAPS
220.0000 mg | ORAL_CAPSULE | Freq: Every day | ORAL | Status: DC
  Administered 2019-10-06 – 2019-10-07 (×2): 220 mg via ORAL
  Filled 2019-10-06 (×2): qty 1

## 2019-10-06 MED ORDER — ROSUVASTATIN CALCIUM 20 MG PO TABS
40.0000 mg | ORAL_TABLET | Freq: Every day | ORAL | Status: DC
  Administered 2019-10-06: 40 mg via ORAL
  Filled 2019-10-06 (×2): qty 2

## 2019-10-06 NOTE — Progress Notes (Signed)
[facility]  ROUNDING NOTE   Subjective:   Hemodialysis treatment yesterday. Tolerated treatment well. UF of 2.5 liters.   INR 1.8  Objective:  Vital signs in last 24 hours:  Temp:  [98.3 F (36.8 C)-102.2 F (39 C)] 98.8 F (37.1 C) (07/01 1155) Pulse Rate:  [84-116] 111 (07/01 1155) Resp:  [11-22] 16 (07/01 1155) BP: (98-152)/(62-70) 106/62 (07/01 1155) SpO2:  [95 %-100 %] 98 % (07/01 1155)  Weight change:  Filed Weights   10/03/19 0910 10/04/19 0423 10/05/19 0500  Weight: 91.7 kg 89 kg 89.4 kg    Intake/Output: I/O last 3 completed shifts: In: 29 [P.O.:720] Out: 2500 [Other:2500]   Intake/Output this shift:  Total I/O In: 600 [P.O.:600] Out: -   Physical Exam: General: NAD,   Head: Normocephalic, atraumatic. Moist oral mucosal membranes  Eyes: Anicteric, PERRL  Neck: Supple, trachea midline  Lungs:  Clear to auscultation  Heart: Regular rate and rhythm  Abdomen:  Soft, nontender,   Extremities:  right lower extremity peripheral edema.  Neurologic: Right groin hematoma  Skin: No lesions  Access: Left AVF    Basic Metabolic Panel: Recent Labs  Lab 09/30/19 0547 09/30/19 0547 09/30/19 2023 10/01/19 0358 10/01/19 0358 10/02/19 0517 10/02/19 0517 10/03/19 0348 10/03/19 0348 10/04/19 0532 10/05/19 0601 10/06/19 0459  NA 139   < >  --  140   < > 138  --  135  --  140 139 138  K 5.1   < >  --  4.2   < > 5.1  --  5.3*  --  4.5 4.4 4.0  CL 99   < >  --  98   < > 96*  --  93*  --  96* 95* 94*  CO2 27   < >  --  31   < > 29  --  28  --  30 30 31   GLUCOSE 110*   < >  --  97   < > 88  --  91  --  105* 106* 89  BUN 49*   < >  --  24*   < > 42*  --  56*  --  39* 56* 36*  CREATININE 8.46*   < >  --  5.98*   < > 8.77*  --  10.81*  --  7.82* 9.96* 7.09*  CALCIUM 8.5*   < >  --  8.0*   < > 8.9   < > 8.3*   < > 8.6* 8.8* 9.0  MG 2.2  --   --  1.7  --   --   --   --   --  2.0 2.1 1.8  PHOS  --   --  4.0  --   --   --   --   --   --   --   --   --     < > = values in this interval not displayed.    Liver Function Tests: Recent Labs  Lab 09/29/19 1504 10/01/19 0358 10/02/19 0517 10/03/19 0348  AST 18 23 48* 36  ALT 16 11 12 10   ALKPHOS 91 71 77 85  BILITOT 1.3* 1.1 1.3* 0.9  PROT 6.2* 5.5* 5.9* 5.8*  ALBUMIN 3.3* 2.7* 2.8* 2.6*   No results for input(s): LIPASE, AMYLASE in the last 168 hours. No results for input(s): AMMONIA in the last 168 hours.  CBC: Recent Labs  Lab 09/29/19 1504 09/29/19 1936 10/01/19 0358 10/01/19  1724 10/02/19 0517 10/03/19 0348 10/04/19 0532 10/05/19 0601 10/06/19 0459  WBC 9.1   < > 9.0  --  11.0* 8.2 7.0 5.8 5.8  NEUTROABS 6.8  --  7.2  --  8.7* 6.5  --   --   --   HGB 8.4*   < > 7.0*   < > 8.0* 7.0* 7.6* 7.4* 7.1*  HCT 26.9*   < > 21.5*   < > 23.9* 21.7* 23.6* 23.5* 23.1*  MCV 110.2*   < > 103.9*  --  98.8 100.9* 99.2 101.7* 102.2*  PLT 134*   < > 111*  --  115* 118* 122* 123* 142*   < > = values in this interval not displayed.    Cardiac Enzymes: No results for input(s): CKTOTAL, CKMB, CKMBINDEX, TROPONINI in the last 168 hours.  BNP: Invalid input(s): POCBNP  CBG: Recent Labs  Lab 10/01/19 1520  GLUCAP 84    Microbiology: Results for orders placed or performed during the hospital encounter of 09/29/19  Culture, blood (x 2)     Status: None   Collection Time: 10/01/19  3:56 AM   Specimen: BLOOD RIGHT HAND  Result Value Ref Range Status   Specimen Description BLOOD RIGHT HAND  Final   Special Requests   Final    BOTTLES DRAWN AEROBIC AND ANAEROBIC Blood Culture adequate volume   Culture   Final    NO GROWTH 5 DAYS Performed at [facility], [address], [place], [place] [address]    Report Status 10/06/2019 FINAL  Final  Culture, blood (x 2)     Status: None   Collection Time: 10/01/19  4:04 AM   Specimen: BLOOD  Result Value Ref Range Status   Specimen Description BLOOD RIGHT WRIST  Final   Special Requests   Final    BOTTLES DRAWN AEROBIC AND  ANAEROBIC Blood Culture adequate volume   Culture   Final    NO GROWTH 5 DAYS Performed at [facility], [address] [place], [place] [address]    Report Status 10/06/2019 FINAL  Final  MRSA PCR Screening     Status: None   Collection Time: 10/01/19  3:19 PM   Specimen: Nasopharyngeal  Result Value Ref Range Status   MRSA by PCR NEGATIVE NEGATIVE Final    Comment:        The GeneXpert MRSA Assay (FDA approved for NASAL specimens only), is one component of a comprehensive MRSA colonization surveillance program. It is not intended to diagnose MRSA infection nor to guide or monitor treatment for MRSA infections. Performed at [facility], [address] [place], [place] [address]   CULTURE, BLOOD (ROUTINE X 2) w Reflex to ID Panel     Status: None (Preliminary result)   Collection Time: 10/03/19  5:49 PM   Specimen: BLOOD  Result Value Ref Range Status   Specimen Description BLOOD BLOOD RIGHT HAND  Final   Special Requests   Final    BOTTLES DRAWN AEROBIC AND ANAEROBIC Blood Culture adequate volume   Culture   Final    NO GROWTH 3 DAYS Performed at [facility], [address] [place], [place] [address]    Report Status PENDING  Incomplete  CULTURE, BLOOD (ROUTINE X 2) w Reflex to ID Panel     Status: None (Preliminary result)   Collection Time: 10/03/19  6:51 PM   Specimen: BLOOD  Result Value Ref Range Status   Specimen Description BLOOD BLOOD RIGHT  HAND  Final   Special Requests   Final    BOTTLES DRAWN AEROBIC AND ANAEROBIC Blood Culture results may not be optimal due to an excessive volume of blood received in culture bottles   Culture   Final    NO GROWTH 3 DAYS Performed at [facility], [address] [place], [place] [address]    Report Status PENDING  Incomplete   *Note: Due to a large number of results and/or encounters for the requested time period, some results have not been displayed. A complete set of results  can be found in Results Review.    Coagulation Studies: Recent Labs    10/04/19 0532 10/05/19 0601 10/06/19 0459  LABPROT 23.7* 19.5* 19.9*  INR 2.2* 1.7* 1.8*    Urinalysis: No results for input(s): COLORURINE, LABSPEC, PHURINE, GLUCOSEU, HGBUR, BILIRUBINUR, KETONESUR, PROTEINUR, UROBILINOGEN, NITRITE, LEUKOCYTESUR in the last 72 hours.  Invalid input(s): APPERANCEUR    Imaging: CT ANGIO AO+BIFEM W & OR WO CONTRAST  Result Date: 10/05/2019 CLINICAL DATA:  Spontaneous hemorrhage of the left thigh. Left femoral pseudoaneurysm status post thrombin injection. Status post stent placement. EXAM: CT ANGIOGRAPHY OF ABDOMINAL AORTA WITH ILIOFEMORAL RUNOFF TECHNIQUE: Multidetector CT imaging of the abdomen, pelvis and lower extremities was performed using the standard protocol during bolus administration of intravenous contrast. Multiplanar CT image reconstructions and MIPs were obtained to evaluate the vascular anatomy. CONTRAST:  167mL OMNIPAQUE IOHEXOL 350 MG/ML SOLN COMPARISON:  None. FINDINGS: VASCULAR Aorta: There are extensive atherosclerotic changes throughout the abdominal aorta without evidence for an aneurysm. Celiac: There is a common origin of the SMA and celiac axis, a normal variant. The artery is widely patent. Renals: Both renal arteries are occluded. IMA: There is moderate narrowing of the proximal IMA. RIGHT Lower Extremity Inflow: There are atherosclerotic changes of the right common iliac and right internal iliac arteries, however they remain widely patent. Outflow: The right common femoral artery demonstrates advanced atherosclerotic changes resulting in likely greater than 50% stenosis. There are atherosclerotic changes throughout the remaining portions of the right SFA and popliteal arteries resulting in less than 50% stenosis. Runoff: The tibial vasculature is highly calcified which limits evaluation. The proximal AT is patent. The mid to distal AT is entirely occluded. The  right peroneal and posterior tibial arteries appear to be patent proximally. Distally, the arteries are difficult to fully evaluate. The peroneal artery appears to be occluded just above the ankle (axial series 5, image 210). The dorsalis pedis artery is occluded. There appears to be a patent medial and lateral plantar arch. LEFT Lower Extremity Inflow: There are atherosclerotic changes of the left common iliac artery and left external iliac arteries without evidence for high-grade stenosis. 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 Outflow: There are atherosclerotic changes of the left common femoral artery resulting in greater than 50% stenosis. There are patent stents within the proximal left SFA and profunda femoris arteries. There are atherosclerotic changes throughout the left SFA and profunda femoris arteries resulting in less than 50% stenosis. Runoff: The proximal anterior tibial artery is patent. However, there appear to be tandem areas of high-grade stenosis for example axial series 5, image 158). The proximal posterior tibial and peroneal arteries appear to be patent, however evaluation is severely limited by heavy atherosclerotic changes and calcifications. Veins: Review of the MIP images confirms the above findings. NON-VASCULAR Lower chest: There is atelectasis at the lung bases.The heart  is significantly enlarged. Hepatobiliary: The liver is normal. Cholelithiasis without acute inflammation.There is no biliary ductal dilation. Pancreas: Normal contours without ductal dilatation. No peripancreatic fluid collection. Spleen: The spleen is enlarged measuring approximately 13 cm craniocaudad. Adrenals/Urinary Tract: --Adrenal glands: Unremarkable. 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 --Left kidney/ureter: The native left kidney is absent. 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  --Urinary bladder: The urinary bladder is underdistended which significantly limits evaluation. Stomach/Bowel: --Stomach/Duodenum: No hiatal hernia or other gastric abnormality. Normal duodenal course and caliber. --Small bowel: Unremarkable. --Colon: There is some new apparent mild wall thickening of the right hemicolon. This is felt to be secondary to underdistention. --Appendix: Normal. Lymphatic: --No retroperitoneal lymphadenopathy. --No mesenteric lymphadenopathy. --No pelvic or inguinal lymphadenopathy. Reproductive: There is a large left-sided hydrocele. Other: There is a trace amount of free fluid in the abdomen and pelvis. The abdominal wall is normal. Musculoskeletal. 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 There is a posterior disc osteophyte complex at the L5-S1 level without significant spinal canal narrowing. 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 There is a large left thigh intramuscular hematoma without evidence for active arterial extravasation. This hematoma is relatively stable in size when compared to prior study currently measuring 14.3 x 6.4 cm (previously measuring 13.6 x 8 cm). There is no definite pseudoaneurysm. There is a metallic clip in the anterior aspect of the left thigh likely related to a failed closure device. Extensive left lower extremity edema is noted. There are moderate-sized bilateral suprapatellar joint effusions, left greater than right. IMPRESSION: 1. Again noted is a large left thigh intramuscular hematoma which is essentially stable from prior study. There is no evidence for active arterial extravasation on this study. No evidence for a pseudoaneurysm. 2. Extensive atherosclerotic disease as detailed above. There appears to be a two or single-vessel runoff bilaterally, however evaluation is severely limited by advanced calcific atherosclerotic disease below the knees. 3. Patent stents within the left SFA  and profunda femoris arteries. 4. Multiple additional chronic findings are noted in the abdomen and pelvis as detailed above, not substantially changed from recent prior study. Aortic Atherosclerosis (ICD10-I70.0). Electronically Signed   By: Constance Holster M.D.   On: 10/05/2019 21:41   US Venous Img Lower Bilateral (DVT)  Result Date: 10/05/2019 CLINICAL DATA:  Bilateral lower extremity edema the past week left greater than right. Patient is currently on anticoagulation. Recent vascular surgery with arterial stenting. Recent thrombin injection. Evaluate for DVT. EXAM: BILATERAL LOWER EXTREMITY VENOUS DOPPLER ULTRASOUND TECHNIQUE: Gray-scale sonography with graded compression, as well as color Doppler and duplex ultrasound were performed to evaluate the lower extremity deep venous systems from the level of the common femoral vein and including the common femoral, femoral, profunda femoral, popliteal and calf veins including the posterior tibial, peroneal and gastrocnemius veins when visible. The superficial great saphenous vein was also interrogated. Spectral Doppler was utilized to evaluate flow at rest and with distal augmentation maneuvers in the common femoral, femoral and popliteal veins. COMPARISON:  CT abdomen and pelvis-10/01/2019 FINDINGS: RIGHT LOWER EXTREMITY Common Femoral Vein: No evidence of thrombus. Normal compressibility, respiratory phasicity and response to augmentation. Saphenofemoral Junction: No evidence of thrombus. Normal compressibility and flow on color Doppler imaging. Profunda Femoral Vein: No evidence of thrombus. Normal compressibility and flow on color Doppler imaging. Femoral Vein: No evidence of thrombus. Normal compressibility, respiratory phasicity and response to augmentation. Popliteal Vein: No evidence  of thrombus. Normal compressibility, respiratory phasicity and response to augmentation. Calf Veins: No evidence of thrombus. Normal compressibility and flow on color  Doppler imaging. Superficial Great Saphenous Vein: No evidence of thrombus. Normal compressibility. Venous Reflux:  None. Other Findings:  None. LEFT LOWER EXTREMITY Common Femoral Vein: No evidence of thrombus. Normal compressibility, respiratory phasicity and response to augmentation. Saphenofemoral Junction: No evidence of thrombus. Normal compressibility and flow on color Doppler imaging. Profunda Femoral Vein: No evidence of thrombus. Normal compressibility and flow on color Doppler imaging. Femoral Vein: No evidence of thrombus. Normal compressibility, respiratory phasicity and response to augmentation. Popliteal Vein: No evidence of thrombus. Normal compressibility, respiratory phasicity and response to augmentation. Calf Veins: No evidence of thrombus. Normal compressibility and flow on color Doppler imaging. Superficial Great Saphenous Vein: No evidence of thrombus. Normal compressibility. Venous Reflux:  None. Other Findings: There is a large (at least 16.0 x 12.0 x 1.4 cm) complex fluid collection within the subcutaneous tissues about the proximal aspect the left thigh. No definitive blood flow is demonstrated within this collection. Additionally, the collection does not definitively communicate with any of the adjacent vascular structures. IMPRESSION: 1. No evidence of DVT within either lower extremity. 2. Approximately 16 cm complex fluid collection within the subcutaneous tissues the proximal aspect of the left thigh, nonspecific though worrisome for a hematoma, as was better demonstrated on preceding abdominal CT performed 10/01/2019. No definitive blood flow is demonstrated within the collection and it does not appear to communicate with any of the adjacent vascular structures though this is somewhat difficult to evaluate given the large size of the collection. Electronically Signed   By: Sandi Mariscal M.D.   On: 10/05/2019 11:44     Medications:     allopurinol  300 mg Oral Daily    Chlorhexidine Gluconate Cloth  6 each Topical Q0600   cholecalciferol  1,000 Units Oral Daily   cinacalcet  30 mg Oral QODAY   [START ON 10/07/2019] epoetin (EPOGEN/PROCRIT) injection  10,000 Units Intravenous Q M,W,F-HD   ezetimibe  10 mg Oral Daily   hydroxychloroquine  200 mg Oral BID   loratadine  10 mg Oral Daily   metoprolol tartrate  2.5 mg Intravenous Once   metoprolol tartrate  25 mg Oral Q6H   midodrine  10 mg Oral Once per day on Mon Wed Fri   multivitamin  1 tablet Oral Daily   pantoprazole  40 mg Oral Daily   rosuvastatin  40 mg Oral q1800   sevelamer carbonate  4,000 mg Oral TID WC   warfarin  1 mg Oral Daily   Warfarin - Physician Dosing Inpatient   Does not apply q1600   zinc sulfate  220 mg Oral Daily   acetaminophen **OR** acetaminophen, bisacodyl, melatonin, nitroGLYCERIN, ondansetron (ZOFRAN) IV, oxyCODONE, polyethylene glycol, simethicone  Assessment/ Plan:  [name] is a 64 y.o. 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 ESRD: Continue MWF schedule. Dialysis for tomorrow  2. Hypotension - midodrine  3. Anemia of chronic kidney disease - EPO with  HD treatment - PRBC transfusion on 6/28  4. Secondary Hyperparathyroidism: - cinacalcet - sevelamer  LOS: 5 Rateel Beldin 7/1/20212:59 PM

## 2019-10-06 NOTE — Progress Notes (Signed)
Progress Note  Patient Name: Jeremy [name] Date of Encounter: 10/06/2019  Primary Cardiologist: [name]  Subjective   No chest pain, dyspnea, palpitations, dizziness, presyncope or syncope.   Back on Coumadin without heparin bridge given hematoma. INR 1.7-->1.8. HGB remains low, though stable at 7.1. Ventricular rates in the 1-teens bpm.   Inpatient Medications    Scheduled Meds: . allopurinol  300 mg Oral Daily  . Chlorhexidine Gluconate Cloth  6 each Topical Q0600  . cholecalciferol  1,000 Units Oral Daily  . cinacalcet  30 mg Oral QODAY  . [START ON 10/07/2019] epoetin (EPOGEN/PROCRIT) injection  10,000 Units Intravenous Q M,W,F-HD  . ezetimibe  10 mg Oral Daily  . hydroxychloroquine  200 mg Oral BID  . loratadine  10 mg Oral Daily  . metoprolol tartrate  2.5 mg Intravenous Once  . metoprolol tartrate  25 mg Oral Q6H  . midodrine  10 mg Oral Once per day on Mon Wed Fri  . multivitamin  1 tablet Oral Daily  . pantoprazole  40 mg Oral Daily  . rosuvastatin  40 mg Oral q1800  . sevelamer carbonate  4,000 mg Oral TID WC  . warfarin  1 mg Oral Daily  . Warfarin - Physician Dosing Inpatient   Does not apply q1600  . zinc sulfate  220 mg Oral Daily   Continuous Infusions:  PRN Meds: acetaminophen **OR** acetaminophen, bisacodyl, melatonin, nitroGLYCERIN, ondansetron (ZOFRAN) IV, oxyCODONE, polyethylene glycol, simethicone   Vital Signs    Vitals:   10/06/19 0102 10/06/19 0539 10/06/19 0926 10/06/19 1155  BP: 98/67 108/67 128/62 106/62  Pulse: 84 (!) 114  (!) 111  Resp: 15 16  16   Temp: 98.6 F (37 C) 99.7 F (37.6 C)  98.8 F (37.1 C)  TempSrc: Oral Oral  Oral  SpO2: 96% 99%  98%  Weight:      Height:        Intake/Output Summary (Last 24 hours) at 10/06/2019 1511 Last data filed at 10/06/2019 1340 Gross per 24 hour  Intake 960 ml  Output 2500 ml  Net -1540 ml   Filed Weights   10/03/19 0910 10/04/19 0423 10/05/19 0500  Weight: 91.7 kg 89 kg 89.4 kg     Telemetry    Atrial flutter with RVR with ventricular rates into the 1-teens - Personally Reviewed  ECG    No new tracings - Personally Reviewed  Physical Exam   GEN: No acute distress.   Neck: No JVD. Cardiac: Tachycardic, no murmurs, rubs, or gallops.  Respiratory: Clear to auscultation bilaterally.  GI: Soft, nontender, non-distended.   MS: Left thigh hematoma noted; No deformity. Neuro:  Alert and oriented x 3; Nonfocal.  Psych: Normal affect.  Labs    Chemistry Recent Labs  Lab 10/01/19 0358 10/01/19 0358 10/02/19 0517 10/02/19 0517 10/03/19 0348 10/03/19 0348 10/04/19 0532 10/05/19 0601 10/06/19 0459  NA 140   < > 138   < > 135   < > 140 139 138  K 4.2   < > 5.1   < > 5.3*   < > 4.5 4.4 4.0  CL 98   < > 96*   < > 93*   < > 96* 95* 94*  CO2 31   < > 29   < > 28   < > 30 30 31   GLUCOSE 97   < > 88   < > 91   < > 105* 106* 89  BUN 24*   < >  42*   < > 56*   < > 39* 56* 36*  CREATININE 5.98*   < > 8.77*   < > 10.81*   < > 7.82* 9.96* 7.09*  CALCIUM 8.0*   < > 8.9   < > 8.3*   < > 8.6* 8.8* 9.0  PROT 5.5*  --  5.9*  --  5.8*  --   --   --   --   ALBUMIN 2.7*  --  2.8*  --  2.6*  --   --   --   --   AST 23  --  48*  --  36  --   --   --   --   ALT 11  --  12  --  10  --   --   --   --   ALKPHOS 71  --  77  --  85  --   --   --   --   BILITOT 1.1  --  1.3*  --  0.9  --   --   --   --   GFRNONAA 9*   < > 6*   < > 5*   < > 7* 5* 8*  GFRAA 11*   < > 7*   < > 5*   < > 8* 6* 9*  ANIONGAP 11   < > 13   < > 14   < > 14 14 13    < > = values in this interval not displayed.     Hematology Recent Labs  Lab 10/04/19 0532 10/05/19 0601 10/06/19 0459  WBC 7.0 5.8 5.8  RBC 2.38* 2.31* 2.26*  HGB 7.6* 7.4* 7.1*  HCT 23.6* 23.5* 23.1*  MCV 99.2 101.7* 102.2*  MCH 31.9 32.0 31.4  MCHC 32.2 31.5 30.7  RDW 18.9* 17.6* 17.2*  PLT 122* 123* 142*    Cardiac EnzymesNo results for input(s): TROPONINI in the last 168 hours. No results for input(s): TROPIPOC in the last  168 hours.   BNPNo results for input(s): BNP, PROBNP in the last 168 hours.   DDimer No results for input(s): DDIMER in the last 168 hours.   Radiology    CT ANGIO AO+BIFEM W & OR WO CONTRAST  Result Date: 10/05/2019 IMPRESSION: 1. Again noted is a large left thigh intramuscular hematoma which is essentially stable from prior study. There is no evidence for active arterial extravasation on this study. No evidence for a pseudoaneurysm. 2. Extensive atherosclerotic disease as detailed above. There appears to be a two or single-vessel runoff bilaterally, however evaluation is severely limited by advanced calcific atherosclerotic disease below the knees. 3. Patent stents within the left SFA and profunda femoris arteries. 4. Multiple additional chronic findings are noted in the abdomen and pelvis as detailed above, not substantially changed from recent prior study. Aortic Atherosclerosis (ICD10-I70.0). Electronically Signed   By: Constance Holster M.D.   On: 10/05/2019 21:41   US Venous Img Lower Bilateral (DVT)  Result Date: 10/05/2019 IMPRESSION: 1. No evidence of DVT within either lower extremity. 2. Approximately 16 cm complex fluid collection within the subcutaneous tissues the proximal aspect of the left thigh, nonspecific though worrisome for a hematoma, as was better demonstrated on preceding abdominal CT performed 10/01/2019. No definitive blood flow is demonstrated within the collection and it does not appear to communicate with any of the adjacent vascular structures though this is somewhat difficult to evaluate given the large size of the collection. Electronically Signed  By: Sandi Mariscal M.D.   On: 10/05/2019 11:44    Cardiac Studies   2D echo 10/01/2019: 1. Left ventricular ejection fraction, by estimation, is 45 to 50%. The  left ventricle has mildly decreased function. The left ventricle  demonstrates regional wall motion abnormalities (see scoring  diagram/findings for  description). There is moderate  left ventricular hypertrophy. Left ventricular diastolic parameters are  indeterminate. There is moderate hypokinesis of the left ventricular,  basal-mid inferior wall and inferolateral wall.  2. Right ventricular systolic function is normal. The right ventricular  size is normal. Tricuspid regurgitation signal is inadequate for assessing  PA pressure.  3. Left atrial size was moderately dilated.  4. Right atrial size was mildly dilated.  5. The mitral valve is normal in structure. No evidence of mitral valve  regurgitation. No evidence of mitral stenosis.  6. The aortic valve has been repaired/replaced. Aortic valve  regurgitation is not visualized. No aortic stenosis is present. There is a  [employer] valve present in the aortic position. Aortic valve mean gradient  measures 11.0 mmHg.  Patient Profile     64 y.o. male with history of CAD status post three-vessel CABG in 09/2018 followed by subsequent PCI/DES to the SVG to RPDA in 07/2019, HFrEF secondary to ICM, persistentA. 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  Assessment & Plan    1. CAD involving the native coronary arteries status post CABG status post subsequent PCI to the SVG to RPDA with NSTEMI: -Never with angina -Initial HS-Tn 495 with a delta of 2763, peak of 8545, currently downtrending -NSTEMI due to demand ischemia in the setting of severe multivessel  native CAD with subsequent disease of the bypass grafts with recent vascular intervention complicated by pseudoaneurysm requiring repair x 2, subsequent anemia requiring pRBC, hypotension, fever -Echo with improved LVSF as outlined above when compared to prior -Not currently a candidate for heparin gtt with recent pseudoaneurysm/hematoma requiring repair x 2 and anemia requiring pRBC -In the setting of the the above, no plans for LHC at this time -In the context of his recent acute illness including NSTEMI, from a cardiac perspective, would recommend deferring elective procedures at this time  2. Persistent A. fib with RVR/atrial flutter: -In the setting of his acute illness/presentation and comorbid conditions  -Exacerbated by the above comorbid conditions and fever -Ventricular rates improving -Continue metoprolol to 25 mg q 6 hours with hold parameters for added rate control, consolidate prior to discharge -Relative hypotension precludes titration of rate control at this time -Digoxin is not an option as he is ESRD -Would like to avoid amiodarone for rate control given [place] has been interrupted  -Anemia is contributing   -Coumadin has been on given anemia/hematoma/pseudoaneurysm this admission -INR 1.7-->1.8 over the past 24 hours, now on Coumadin without heparin bridge given hematoma per vascular  -Ideally, goal INR 2.5-3.5 -It may be reasonable to have a slightly lower INR target in the short term as he improves from his acute illness after prior  discussions with MD  3.HFrEF secondary to ICM: -Volume managed by HD -Echo this admission showed a slightly improved LVSF -At baseline, and currently, hypotension precludes GDMT outside of metoprolol   4.PAD status post recent intervention complicated by pseudoaneurysm status post thrombin injection status post stenting: -Management per vascular surgery   5. Acute anemia on anemia of chronic disease: -Status post multiple pRBC  transfusions -HGB low, though stable -Contributing to his tachycardic rates  -Management per IM  6.Severe AS status post mechanical AVR: -Echo as above -INR 1.7-->1.8 -Back on Coumadin without heparin bridge given hematoma per vascular surgery -Resume ASA when ok per vascular   7. Hyperkalemia/ESRD: -HD Per nephrology   8.HTN: -Blood pressure well controlled -Continue metoprolol as above  9.HLD: -Zet[place]  For questions or updates, please contact [place] Please consult [email or web address] for contact info under Cardiology/STEMI.    Signed, Christell Faith, PA-C [place] Pager: [phone] 10/06/2019, 3:11 PM

## 2019-10-06 NOTE — Progress Notes (Signed)
[place] [facility] [place] NAME: Jeremy [name]    MR#:  [id number]  DATE OF BIRTH:  04-01-1956  SUBJECTIVE:   Denies any complaints. Remains afebrile. Tolerated HD well  REVIEW OF SYSTEMS:   Review of Systems  Constitutional: Negative for chills, fever and weight loss.  HENT: Negative for ear discharge, ear pain and nosebleeds.   Eyes: Negative for blurred vision, pain and discharge.  Respiratory: Negative for sputum production, shortness of breath, wheezing and stridor.   Cardiovascular: Negative for chest pain, palpitations, orthopnea and PND.  Gastrointestinal: Negative for abdominal pain, diarrhea, nausea and vomiting.  Genitourinary: Negative for frequency and urgency.  Musculoskeletal: Negative for back pain and joint pain.  Neurological: Negative for sensory change, speech change, focal weakness and weakness.  Psychiatric/Behavioral: Negative for depression and hallucinations. The patient is not nervous/anxious.    Tolerating Diet:yes Tolerating PT: ambulatory--outpt PT  DRUG ALLERGIES:   Allergies  Allergen Reactions  . Statins     Other reaction(s): Arthralgias (intolerance), Myalgias (intolerance)  Pt taking pravastatin   . Sertraline Rash    VITALS:  Blood pressure 108/67, pulse (!) 114, temperature 99.7 F (37.6 C), temperature source Oral, resp. rate 16, height 5\' 10"  (1.778 m), weight 89.4 kg, SpO2 99 %.  PHYSICAL EXAMINATION:   Physical Exam  GENERAL:  64 y.o.-year-old patient lying in the bed with no acute distress. Appears chronically ill EYES: Pupils equal, round, reactive to light and accommodation. No scleral icterus.   HEENT: Head atraumatic, normocephalic. Oropharynx and nasopharynx clear.  NECK:  Supple, no jugular venous distention. No thyroid enlargement, no tenderness.  LUNGS: Normal breath sounds bilaterally, no wheezing, rales, rhonchi. No use of accessory muscles of respiration.  CARDIOVASCULAR: S1, S2  normal. No murmurs, rubs, or gallops.  ABDOMEN: Soft, nontender, nondistended. Bowel sounds present. No organomegaly or mass.  EXTREMITIES: No cyanosis, clubbing or edema b/l.   HD Access left upper extremity NEUROLOGIC: Cranial nerves II through XII are intact. No focal Motor or sensory deficits b/l.   PSYCHIATRIC:  patient is alert and oriented x 3.  SKIN: No obvious rash, lesion, or ulcer.   LABORATORY PANEL:  CBC Recent Labs  Lab 10/06/19 0459  WBC 5.8  HGB 7.1*  HCT 23.1*  PLT 142*    Chemistries  Recent Labs  Lab 10/03/19 0348 10/04/19 0532 10/06/19 0459  NA 135   < > 138  K 5.3*   < > 4.0  CL 93*   < > 94*  CO2 28   < > 31  GLUCOSE 91   < > 89  BUN 56*   < > 36*  CREATININE 10.81*   < > 7.09*  CALCIUM 8.3*   < > 9.0  MG  --    < > 1.8  AST 36  --   --   ALT 10  --   --   ALKPHOS 85  --   --   BILITOT 0.9  --   --    < > = values in this interval not displayed.   Cardiac Enzymes No results for input(s): TROPONINI in the last 168 hours. RADIOLOGY:  CT ANGIO AO+BIFEM W & OR WO CONTRAST  Result Date: 10/05/2019 CLINICAL DATA:  Spontaneous hemorrhage of the left thigh. Left femoral pseudoaneurysm status post thrombin injection. Status post stent placement. EXAM: CT ANGIOGRAPHY OF ABDOMINAL AORTA WITH ILIOFEMORAL RUNOFF TECHNIQUE: Multidetector CT imaging of the abdomen, pelvis and lower extremities was  performed using the standard protocol during bolus administration of intravenous contrast. Multiplanar CT image reconstructions and MIPs were obtained to evaluate the vascular anatomy. CONTRAST:  162mL OMNIPAQUE IOHEXOL 350 MG/ML SOLN COMPARISON:  None. FINDINGS: VASCULAR Aorta: There are extensive atherosclerotic changes throughout the abdominal aorta without evidence for an aneurysm. Celiac: There is a common origin of the SMA and celiac axis, a normal variant. The artery is widely patent. Renals: Both renal arteries are occluded. IMA: There is moderate narrowing of the  proximal IMA. RIGHT Lower Extremity Inflow: There are atherosclerotic changes of the right common iliac and right internal iliac arteries, however they remain widely patent. Outflow: The right common femoral artery demonstrates advanced atherosclerotic changes resulting in likely greater than 50% stenosis. There are atherosclerotic changes throughout the remaining portions of the right SFA and popliteal arteries resulting in less than 50% stenosis. Runoff: The tibial vasculature is highly calcified which limits evaluation. The proximal AT is patent. The mid to distal AT is entirely occluded. The right peroneal and posterior tibial arteries appear to be patent proximally. Distally, the arteries are difficult to fully evaluate. The peroneal artery appears to be occluded just above the ankle (axial series 5, image 210). The dorsalis pedis artery is occluded. There appears to be a patent medial and lateral plantar arch. LEFT Lower Extremity Inflow: There are atherosclerotic changes of the left common iliac artery and left external iliac arteries without evidence for high-grade stenosis. 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 Outflow: There are atherosclerotic changes of the left common femoral artery resulting in greater than 50% stenosis. There are patent stents within the proximal left SFA and profunda femoris arteries. There are atherosclerotic changes throughout the left SFA and profunda femoris arteries resulting in less than 50% stenosis. Runoff: The proximal anterior tibial artery is patent. However, there appear to be tandem areas of high-grade stenosis for example axial series 5, image 158). The proximal posterior tibial and peroneal arteries appear to be patent, however evaluation is severely limited by heavy atherosclerotic changes and calcifications. Veins: Review of the MIP images confirms the above findings. NON-VASCULAR Lower  chest: There is atelectasis at the lung bases.The heart is significantly enlarged. Hepatobiliary: The liver is normal. Cholelithiasis without acute inflammation.There is no biliary ductal dilation. Pancreas: Normal contours without ductal dilatation. No peripancreatic fluid collection. Spleen: The spleen is enlarged measuring approximately 13 cm craniocaudad. Adrenals/Urinary Tract: --Adrenal glands: Unremarkable. 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 --Left kidney/ureter: The native left kidney is absent. 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 --Urinary bladder: The urinary bladder is underdistended which significantly limits evaluation. Stomach/Bowel: --Stomach/Duodenum: No hiatal hernia or other gastric abnormality. Normal duodenal course and caliber. --Small bowel: Unremarkable. --Colon: There is some new apparent mild wall thickening of the right hemicolon. This is felt to be secondary to underdistention. --Appendix: Normal. Lymphatic: --No retroperitoneal lymphadenopathy. --No mesenteric lymphadenopathy. --No pelvic or inguinal lymphadenopathy. Reproductive: There is a large left-sided hydrocele. Other: There is a trace amount of free fluid in the abdomen and pelvis. The abdominal wall is normal. Musculoskeletal. 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 There is a posterior disc osteophyte complex at the L5-S1 level without significant spinal canal narrowing. 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 There is a large left thigh intramuscular hematoma without evidence for active arterial extravasation. This hematoma is  relatively stable in size when compared to prior study currently measuring 14.3 x 6.4 cm (previously measuring 13.6 x 8 cm). There is no definite pseudoaneurysm. There is a metallic clip in the anterior aspect of the left thigh likely related to a failed closure device. Extensive left  lower extremity edema is noted. There are moderate-sized bilateral suprapatellar joint effusions, left greater than right. IMPRESSION: 1. Again noted is a large left thigh intramuscular hematoma which is essentially stable from prior study. There is no evidence for active arterial extravasation on this study. No evidence for a pseudoaneurysm. 2. Extensive atherosclerotic disease as detailed above. There appears to be a two or single-vessel runoff bilaterally, however evaluation is severely limited by advanced calcific atherosclerotic disease below the knees. 3. Patent stents within the left SFA and profunda femoris arteries. 4. Multiple additional chronic findings are noted in the abdomen and pelvis as detailed above, not substantially changed from recent prior study. Aortic Atherosclerosis (ICD10-I70.0). Electronically Signed   By: Constance Holster M.D.   On: 10/05/2019 21:41   US Venous Img Lower Bilateral (DVT)  Result Date: 10/05/2019 CLINICAL DATA:  Bilateral lower extremity edema the past week left greater than right. Patient is currently on anticoagulation. Recent vascular surgery with arterial stenting. Recent thrombin injection. Evaluate for DVT. EXAM: BILATERAL LOWER EXTREMITY VENOUS DOPPLER ULTRASOUND TECHNIQUE: Gray-scale sonography with graded compression, as well as color Doppler and duplex ultrasound were performed to evaluate the lower extremity deep venous systems from the level of the common femoral vein and including the common femoral, femoral, profunda femoral, popliteal and calf veins including the posterior tibial, peroneal and gastrocnemius veins when visible. The superficial great saphenous vein was also interrogated. Spectral Doppler was utilized to evaluate flow at rest and with distal augmentation maneuvers in the common femoral, femoral and popliteal veins. COMPARISON:  CT abdomen and pelvis-10/01/2019 FINDINGS: RIGHT LOWER EXTREMITY Common Femoral Vein: No evidence of thrombus.  Normal compressibility, respiratory phasicity and response to augmentation. Saphenofemoral Junction: No evidence of thrombus. Normal compressibility and flow on color Doppler imaging. Profunda Femoral Vein: No evidence of thrombus. Normal compressibility and flow on color Doppler imaging. Femoral Vein: No evidence of thrombus. Normal compressibility, respiratory phasicity and response to augmentation. Popliteal Vein: No evidence of thrombus. Normal compressibility, respiratory phasicity and response to augmentation. Calf Veins: No evidence of thrombus. Normal compressibility and flow on color Doppler imaging. Superficial Great Saphenous Vein: No evidence of thrombus. Normal compressibility. Venous Reflux:  None. Other Findings:  None. LEFT LOWER EXTREMITY Common Femoral Vein: No evidence of thrombus. Normal compressibility, respiratory phasicity and response to augmentation. Saphenofemoral Junction: No evidence of thrombus. Normal compressibility and flow on color Doppler imaging. Profunda Femoral Vein: No evidence of thrombus. Normal compressibility and flow on color Doppler imaging. Femoral Vein: No evidence of thrombus. Normal compressibility, respiratory phasicity and response to augmentation. Popliteal Vein: No evidence of thrombus. Normal compressibility, respiratory phasicity and response to augmentation. Calf Veins: No evidence of thrombus. Normal compressibility and flow on color Doppler imaging. Superficial Great Saphenous Vein: No evidence of thrombus. Normal compressibility. Venous Reflux:  None. Other Findings: There is a large (at least 16.0 x 12.0 x 1.4 cm) complex fluid collection within the subcutaneous tissues about the proximal aspect the left thigh. No definitive blood flow is demonstrated within this collection. Additionally, the collection does not definitively communicate with any of the adjacent vascular structures. IMPRESSION: 1. No evidence of DVT within either lower extremity. 2.  Approximately 16 cm complex fluid collection  within the subcutaneous tissues the proximal aspect of the left thigh, nonspecific though worrisome for a hematoma, as was better demonstrated on preceding abdominal CT performed 10/01/2019. No definitive blood flow is demonstrated within the collection and it does not appear to communicate with any of the adjacent vascular structures though this is somewhat difficult to evaluate given the large size of the collection. Electronically Signed   By: Sandi Mariscal M.D.   On: 10/05/2019 11:44   ASSESSMENT AND PLAN:  64 y.o. Male who on 09/29/19 underwent thrombin injection of pseudoaneurysm of the left femoral artery by Vascular Surgery.  Post procedure developed left thigh hematoma requiring stent placement and hospital admission.  Late in the evening 09/30/19 developed fever, A-fib w/ RVR, and transient hypotension during Hemodialysis. Found to have NSTEMI of which Cardiology is following.   Symptomatic anemia, acute blood loss anemia secondary to left thigh hematoma causing hypotension in the setting of coagulopathy due to Coumadin and INR more than 3. -Received total 3 units of PRBC.   -Hemoglobin 7.1.  Blood loss from hematoma.   -Coumadin now resumed per vascular.  Avoid reversal due to presence of mechanical heart valve. -INR is 1.8. No heparin bridge recommended per vascular due to hematoma -pharmacy to dose warfarin. -ASA and plavix to be resumed when appropriate per vascular  Febrile episode: Etiology unknown.  Probably vascular manipulation.  - No localized evidence of infection.  - CT scan with no evidence of abscess.  - Blood cultures negative so far.    Monitoring off antibiotics. -Continues to remain afebrile  A. fib with RVR: Due to hemodynamic disturbance.   - Seen by cardiology--follows with [facility]. - Restarted on metoprolol.  Elevated troponin: With extensive cardiac history.  Suspect demand ischemia.  Followed by cardiology.  Troponin  flattened.  ESRD on hemodialysis: Followed by nephrology.   -continued in house HD  Obstructive sleep apnea: Using CPAP at night.  Left femoral artery angioplasty with a stent, thrombin injection for pseudoaneurysm resulting hematoma: Conservative management.  As per vascular surgery.    Status is: Inpatient  Dispo: The patient is from: Home              Anticipated d/c is SR:PRXY              Anticipated d/c date is: likely today per vascular notes              Patient currently is medically stable to d/c. discharge when okay by vascular  Medicine will sign off.     TOTAL TIME TAKING CARE OF THIS PATIENT: *25* minutes.  >50% time spent on counselling and coordination of care  Note: This dictation was prepared with Dragon dictation along with smaller phrase technology. Any transcriptional errors that result from this process are unintentional.  Fritzi Mandes M.D    [facility]   CC: Primary care physician; Leone Haven, MDPatient ID: Jeremy [name], male   DOB: 1955/06/08, 64 y.o.   MRN: [id number]

## 2019-10-06 NOTE — Care Management Important Message (Signed)
Important Message  Patient Details  Name: Jeremy [name] MRN: [id number] Date of Birth: 12/19/1955   Medicare Important Message Given:  Yes     Dannette Barbara 10/06/2019, 1:59 PM

## 2019-10-06 NOTE — Progress Notes (Signed)
Patient expressed some concern regarding not increasing his coumadin dose for this afternoon, he states that in the past they have either done 1.5 or 2.mg to get him to his goal. Notified Dr. Posey Pronto.

## 2019-10-06 NOTE — Progress Notes (Signed)
[facility] Daily Progress Note   Subjective: 09/29/19:  1. Thrombin injection leftfemoral artery pseudoaneurysm with ultrasound guidance  With take back to angiography suite for:  1.Ultrasound guidance for vascular accessrightfemoral artery 2.Catheter placement into left superficial femoral and profunda femoris artery 3.Aortogram and selectiveleftlower extremity angiogramoccluding selective images of the profunda and superficial femoral arteries 4.Covered stent placement to the left superficial femoral artery with 6 mm diameter by 5 and 6 mm diameter by 2.5 cm length Viabahn stents 5.Covered stent placement to the left profunda femoris artery with 6 mm diameter by 2.5 cm length Viabahn stent 6.Extension of the stents bilaterally up into the distal common femoral artery with a 6 mm diameter by 7.5 cm length Viabahn stent in the superficial femoral artery and a 6 mm diameter by 5 cm length Viabahn stent in the profunda femoris artery going about 1 to 1-1/2 cm into the common femoral artery distally 7.StarClose closure device rightfemoral artery  No issues overnight.  Continued improvement in regard to the patient was left lower extremity discomfort.  Objective: Vitals:   10/05/19 2120 10/06/19 0102 10/06/19 0539 10/06/19 0926  BP:  98/67 108/67 128/62  Pulse:  84 (!) 114   Resp: 20 15 16    Temp: 99.4 F (37.4 C) 98.6 F (37 C) 99.7 F (37.6 C)   TempSrc: Oral Oral Oral   SpO2:  96% 99%   Weight:      Height:        Intake/Output Summary (Last 24 hours) at 10/06/2019 1051 Last data filed at 10/06/2019 1001 Gross per 24 hour  Intake 720 ml  Output 2500 ml  Net -1780 ml   Physical Exam: A&Ox3, NAD CV: RRR Pulmonary: CTA Bilaterally Abdomen: Soft, Nontender, Nondistended Vascular: Left lower extremity:Thigh soft. Calf soft.  Extremities warm distally to toes. Good capillary refill. Hematoma noted to mid thigh. Motor Jeremy [name] is intact.  Laboratory: CBC    Component Value Date/Time   WBC 5.8 10/06/2019 0459   HGB 7.1 (L) 10/06/2019 0459   HGB 10.0 (L) 08/30/2014 1013   HCT 23.1 (L) 10/06/2019 0459   HCT 32.2 (L) 08/30/2014 1013   PLT 142 (L) 10/06/2019 0459   PLT 125 (L) 08/30/2014 1013   BMET    Component Value Date/Time   NA 138 10/06/2019 0459   NA 143 04/14/2019 0000   K 4.0 10/06/2019 0459   CL 94 (L) 10/06/2019 0459   CO2 31 10/06/2019 0459   GLUCOSE 89 10/06/2019 0459   BUN 36 (H) 10/06/2019 0459   BUN 20 04/14/2019 0000   CREATININE 7.09 (H) 10/06/2019 0459   CALCIUM 9.0 10/06/2019 0459   CALCIUM 9.2 12/14/2015 1059   GFRNONAA 8 (L) 10/06/2019 0459   GFRAA 9 (L) 10/06/2019 0459   Assessment/Planning: The patient is a 64 year old male who presented with left femoral pseudoaneurysm status post thrombin injection status post take back to the angiography suite for hematoma status post stent placement- POD#7  1)Hbg stable. Monitor CBC 2) Coumadin restarted. INR today is 1.8. INR this AM. 3) Appreciate assistance in management by Cards / Neph / and hospitalist team. 4) Plan is for patient will undergo dialysis tomorrow and then be discharged home.  DiscussedwithDr. Ellis Parents Adesuwa Osgood PA-C 10/06/2019 10:51 AM

## 2019-10-06 NOTE — TOC Initial Note (Signed)
Transition of Care [facility]) - Initial/Assessment Note    Patient Details  Name: Jeremy [name] MRN: [id number] Date of Birth: 22-Feb-1956  Transition of Care [facility]) CM/SW Contact:    Beverly Sessions, RN Phone Number: 10/06/2019, 10:58 AM  Clinical Narrative:                 Patient admitted from home with Fever Patient states he lives at home with his girlfriend.  PCP Caryl Bis - Patient states at baseline he drives himself to appointments and [facility] - Denies issues obtaining medications  Home DME - CPAP, cane, and RW  PT has assessed patient and recommends outpatient PT.  Patient has concerns due to weakness about being able to get to outpatient PT and would prefer home health PT.  CMS Medicare.gov Compare Post Acute Care list reviewed with reviewed with patient.  Patient states I want the ones with the highest score.  At this time [facility] and [facility] have a 4.5 star review.  Patient states out of those 2 he does not have a preference of agency.  Referral made and accepted by [facility]mann at [facility]   Expected Discharge Plan: [place] Barriers to Discharge: Continued Medical Work up   Patient Goals and CMS Choice Patient states their goals for this hospitalization and ongoing recovery are:: to discharge tomorrow CMS Medicare.gov Compare Post Acute Care list provided to:: Patient    Expected Discharge Plan and Services Expected Discharge Plan: [place]   Discharge Planning Services: CM Consult Post Acute Care Choice: [place] arrangements for the past 2 months: Single Family Home                           HH Arranged: PT          Prior Living Arrangements/Services Living arrangements for the past 2 months: Single Family Home Lives with:: Significant Other Patient language and need for interpreter reviewed:: Yes Do you feel safe going back to the place where you live?: Yes      Need for Family Participation in Patient  Care: Yes (Comment) Care giver support system in place?: Yes (comment) Current home services: DME Criminal Activity/Legal Involvement Pertinent to Current Situation/Hospitalization: No - Comment as needed  Activities of Daily Living Home Assistive Devices/Equipment: None ADL Screening (condition at time of admission) Patient's cognitive ability adequate to safely complete daily activities?: Yes Is the patient deaf or have difficulty hearing?: No Does the patient have difficulty seeing, even when wearing glasses/contacts?: No Does the patient have difficulty concentrating, remembering, or making decisions?: No Patient able to express need for assistance with ADLs?: Yes Does the patient have difficulty dressing or bathing?: No Independently performs ADLs?: Yes (appropriate for developmental age) Does the patient have difficulty walking or climbing stairs?: No Weakness of Legs: None Weakness of Arms/Hands: None  Permission Sought/Granted                  Emotional Assessment Appearance:: Appears stated age Attitude/Demeanor/Rapport: Gracious Affect (typically observed): Accepting Orientation: : Oriented to Self, Oriented to Place, Oriented to  Time   Psych Involvement: No (comment)  Admission diagnosis:  Postprocedural hypotension [I95.81] Acute post-operative pain [G89.18] Hematoma of thigh, initial encounter [S70.10XA] Thigh hematoma [S70.10XA] Patient Active Problem List   Diagnosis Date Noted  . Demand ischemia ([place])   . Atrial flutter ([place])   . Non-ST elevation (NSTEMI) myocardial infarction ([place])   .  Elevated troponin 10/01/2019  . Thigh hematoma 10/01/2019  . Postprocedural hypotension   . Hematoma of thigh, left, initial encounter 09/29/2019  . Unstable angina ([place]) 07/13/2019  . Atherosclerosis of native arteries of the extremities with ulceration ([place]) 07/05/2019  . Foot ulcer ([place]) 05/27/2019  . Skin lesion 11/17/2018  . Complication of vascular access for  dialysis 10/01/2018  . S/P CABG x 3 09/16/2018  . Hydrocele 08/16/2018  . IBS (irritable bowel syndrome) 03/19/2018  . Depression 05/17/2017  . AV fistula ([place]) 05/17/2017  . Chronic pain of both knees 05/17/2017  . Arthralgia 05/17/2017  . Osteoarthritis 03/17/2017  . Trigger little finger of right hand 03/17/2017  . Liver disease, unspecified 03/17/2017  . 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 Atrial fibrillation ([place]) 10/30/2016  . Ventral hernia 10/14/2016  . History of renal cell carcinoma 08/04/2016  . Renal transplant failure and rejection 07/31/2016  . Fever 07/17/2016  . Hypogonadism in male 07/25/2015  . 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 Gout 04/24/2015  . Proteinuria 01/03/2015  . Chronic anticoagulation 11/06/2014  . Iron deficiency anemia 11/02/2014  . Epigastric hernia 02/08/2014  . 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 Vitamin D deficiency 08/16/2013  . Spinal stenosis 06/20/2013  . OSA (obstructive sleep apnea)   . Tremor 12/30/2012  . History of kidney transplant 12/15/2012  . Immunosuppressive management encounter following kidney transplant 12/15/2012  . S/P angioplasty with stent 09/07/2012  . H/O mitral valve replacement with mechanical valve 09/07/2012  . Lumbar radiculopathy 09/06/2012  . ESRD (end stage renal disease) ([place]) 07/26/2012  . Hyperlipidemia 07/26/2012  . S/P AVR (aortic valve replacement) 06/23/2012  . Aortic valve disorder 06/14/2012  . Heart failure ([place]) 05/30/2012  . Thyroid nodule 06/26/2011  . CAD (coronary artery disease) 05/26/2011  . Essential hypertension 05/19/2011   PCP:  Leone Haven, MD Pharmacy:   [id number] [place], [name] - [address] [name] [address] Phone: [phone] Fax: [phone]  [facility] [place] [place] [facility] [address] to [address] [place]  [address] Phone: [phone] Fax: [place], [place] [address] [name] [address] Phone: [phone] Fax: [phone]     Social Determinants of Health (SDOH) Interventions    Readmission Risk Interventions Readmission Risk Prevention Plan 10/06/2019 07/14/2019 03/18/2019  Transportation Screening Complete Complete Complete  HRI or Home Care Consult Complete Complete -  Social Work Consult for [place] Planning/Counseling - Complete -  Palliative Care Screening Not Applicable Not Applicable -  Medication Review Press photographer) Complete Complete Complete  PCP or Specialist appointment within 3-5 days of discharge - - Complete  SW Recovery Care/Counseling Consult - - Complete  [place] - - Not Applicable  Some recent data might be hidden

## 2019-10-06 NOTE — Plan of Care (Signed)

## 2019-10-07 ENCOUNTER — Encounter: Payer: Self-pay | Admitting: Vascular Surgery

## 2019-10-07 DIAGNOSIS — Z7901 Long term (current) use of anticoagulants: Secondary | ICD-10-CM

## 2019-10-07 LAB — CBC
HCT: 22.6 % — ABNORMAL LOW (ref 39.0–52.0)
Hemoglobin: 7.1 g/dL — ABNORMAL LOW (ref 13.0–17.0)
MCH: 32.1 pg (ref 26.0–34.0)
MCHC: 31.4 g/dL (ref 30.0–36.0)
MCV: 102.3 fL — ABNORMAL HIGH (ref 80.0–100.0)
Platelets: 157 10*3/uL (ref 150–400)
RBC: 2.21 MIL/uL — ABNORMAL LOW (ref 4.22–5.81)
RDW: 16.7 % — ABNORMAL HIGH (ref 11.5–15.5)
WBC: 5.8 10*3/uL (ref 4.0–10.5)
nRBC: 0 % (ref 0.0–0.2)

## 2019-10-07 LAB — BASIC METABOLIC PANEL
Anion gap: 15 (ref 5–15)
BUN: 54 mg/dL — ABNORMAL HIGH (ref 8–23)
CO2: 30 mmol/L (ref 22–32)
Calcium: 9 mg/dL (ref 8.9–10.3)
Chloride: 94 mmol/L — ABNORMAL LOW (ref 98–111)
Creatinine, Ser: 9.41 mg/dL — ABNORMAL HIGH (ref 0.61–1.24)
GFR calc Af Amer: 6 mL/min — ABNORMAL LOW (ref >60)
GFR calc non Af Amer: 5 mL/min — ABNORMAL LOW (ref >60)
Glucose, Bld: 90 mg/dL (ref 70–99)
Potassium: 4.3 mmol/L (ref 3.5–5.1)
Sodium: 139 mmol/L (ref 135–145)

## 2019-10-07 LAB — PREPARE RBC (CROSSMATCH)

## 2019-10-07 LAB — MAGNESIUM: Magnesium: 1.9 mg/dL (ref 1.7–2.4)

## 2019-10-07 LAB — PROTIME-INR
INR: 2.1 — ABNORMAL HIGH (ref 0.8–1.2)
Prothrombin Time: 23 seconds — ABNORMAL HIGH (ref 11.4–15.2)

## 2019-10-07 MED ORDER — OXYCODONE-ACETAMINOPHEN 5-325 MG PO TABS: 1.0000 | ORAL_TABLET | Freq: Four times a day (QID) | ORAL | 0 refills | Status: DC | PRN

## 2019-10-07 MED ORDER — METOPROLOL TARTRATE 50 MG PO TABS
50.0000 mg | ORAL_TABLET | Freq: Two times a day (BID) | ORAL | 0 refills | Status: DC
Start: 2019-10-07 — End: 2019-10-18

## 2019-10-07 MED ORDER — SODIUM CHLORIDE 0.9% IV SOLUTION: Freq: Once | INTRAVENOUS | Status: AC

## 2019-10-07 NOTE — Discharge Instructions (Addendum)
You may shower as of tomorrow. No heavy lifting greater than 10 pounds or engaging in strenuous activity for at least 3 weeks. No driving while taking pain medication Apply warm compresses to hematoma area Continue to remain active including walking this will help the hematoma absorb faster You will need to follow-up with the clinician who manages your INR.  They will have to check your INR in a day or 2 to assure you remain therapeutic.

## 2019-10-07 NOTE — Progress Notes (Signed)
[facility]  ROUNDING NOTE   Subjective:   Patient seen and examined on hemodialysis treatment. Tolerating treatment well. UF goal of 2.5 liters.  PRBC transfusion ordered.   INR 2.1    HEMODIALYSIS FLOWSHEET:  Blood Flow Rate (mL/min): 400 mL/min Arterial Pressure (mmHg): -180 mmHg Venous Pressure (mmHg): 140 mmHg Transmembrane Pressure (mmHg): 90 mmHg Ultrafiltration Rate (mL/min): 720 mL/min Dialysate Flow Rate (mL/min): 600 ml/min Conductivity: Machine : 15.2 Conductivity: Machine : 15.2 Dialysis Fluid Bolus: Normal Saline Bolus Amount (mL): 250 mL    Objective:  Vital signs in last 24 hours:  Temp:  [98.2 F (36.8 C)-99.5 F (37.5 C)] 98.2 F (36.8 C) (07/02 0920) Pulse Rate:  [105-115] 115 (07/02 1100) Resp:  [13-21] 21 (07/02 1100) BP: (105-132)/(62-86) 111/72 (07/02 1100) SpO2:  [98 %-100 %] 100 % (07/02 0920) Weight:  [87.8 kg] 87.8 kg (07/02 0500)  Weight change:  Filed Weights   10/04/19 0423 10/05/19 0500 10/07/19 0500  Weight: 89 kg 89.4 kg 87.8 kg    Intake/Output: I/O last 3 completed shifts: In: 960 [P.O.:960] Out: 0    Intake/Output this shift:  No intake/output data recorded.  Physical Exam: General: NAD,   Head: Normocephalic, atraumatic. Moist oral mucosal membranes  Eyes: Anicteric, PERRL  Neck: Supple, trachea midline  Lungs:  Clear to auscultation  Heart: Regular rate and rhythm  Abdomen:  Soft, nontender,   Extremities:  right lower extremity peripheral edema.  Neurologic: Right groin hematoma  Skin: No lesions  Access: Left AVF    Basic Metabolic Panel: Recent Labs  Lab 09/30/19 2023 10/01/19 0358 10/02/19 0517 10/03/19 0348 10/03/19 0348 10/04/19 0532 10/04/19 0532 10/05/19 0601 10/06/19 0459 10/07/19 0532  NA  --  140   < > 135  --  140  --  139 138 139  K  --  4.2   < > 5.3*  --  4.5  --  4.4 4.0 4.3  CL  --  98   < > 93*  --  96*  --  95* 94* 94*  CO2  --  31   < > 28  --  30  --  30 31 30    GLUCOSE  --  97   < > 91  --  105*  --  106* 89 90  BUN  --  24*   < > 56*  --  39*  --  56* 36* 54*  CREATININE  --  5.98*   < > 10.81*  --  7.82*  --  9.96* 7.09* 9.41*  CALCIUM  --  8.0*   < > 8.3*   < > 8.6*   < > 8.8* 9.0 9.0  MG  --  1.7  --   --   --  2.0  --  2.1 1.8 1.9  PHOS 4.0  --   --   --   --   --   --   --   --   --    < > = values in this interval not displayed.    Liver Function Tests: Recent Labs  Lab 10/01/19 0358 10/02/19 0517 10/03/19 0348  AST 23 48* 36  ALT 11 12 10   ALKPHOS 71 77 85  BILITOT 1.1 1.3* 0.9  PROT 5.5* 5.9* 5.8*  ALBUMIN 2.7* 2.8* 2.6*   No results for input(s): LIPASE, AMYLASE in the last 168 hours. No results for input(s): AMMONIA in the last 168 hours.  CBC: Recent Labs  Lab  10/01/19 0358 10/01/19 1724 10/02/19 0517 10/02/19 0517 10/03/19 0348 10/04/19 0532 10/05/19 0601 10/06/19 0459 10/07/19 0532  WBC 9.0  --  11.0*   < > 8.2 7.0 5.8 5.8 5.8  NEUTROABS 7.2  --  8.7*  --  6.5  --   --   --   --   HGB 7.0*   < > 8.0*   < > 7.0* 7.6* 7.4* 7.1* 7.1*  HCT 21.5*   < > 23.9*   < > 21.7* 23.6* 23.5* 23.1* 22.6*  MCV 103.9*  --  98.8   < > 100.9* 99.2 101.7* 102.2* 102.3*  PLT 111*  --  115*   < > 118* 122* 123* 142* 157   < > = values in this interval not displayed.    Cardiac Enzymes: No results for input(s): CKTOTAL, CKMB, CKMBINDEX, TROPONINI in the last 168 hours.  BNP: Invalid input(s): POCBNP  CBG: Recent Labs  Lab 10/01/19 1520  GLUCAP 84    Microbiology: Results for orders placed or performed during the hospital encounter of 09/29/19  Culture, blood (x 2)     Status: None   Collection Time: 10/01/19  3:56 AM   Specimen: BLOOD RIGHT HAND  Result Value Ref Range Status   Specimen Description BLOOD RIGHT HAND  Final   Special Requests   Final    BOTTLES DRAWN AEROBIC AND ANAEROBIC Blood Culture adequate volume   Culture   Final    NO GROWTH 5 DAYS Performed at [facility], [address],  [place], [place] [address]    Report Status 10/06/2019 FINAL  Final  Culture, blood (x 2)     Status: None   Collection Time: 10/01/19  4:04 AM   Specimen: BLOOD  Result Value Ref Range Status   Specimen Description BLOOD RIGHT WRIST  Final   Special Requests   Final    BOTTLES DRAWN AEROBIC AND ANAEROBIC Blood Culture adequate volume   Culture   Final    NO GROWTH 5 DAYS Performed at [facility], [address] [place], [place] [address]    Report Status 10/06/2019 FINAL  Final  MRSA PCR Screening     Status: None   Collection Time: 10/01/19  3:19 PM   Specimen: Nasopharyngeal  Result Value Ref Range Status   MRSA by PCR NEGATIVE NEGATIVE Final    Comment:        The GeneXpert MRSA Assay (FDA approved for NASAL specimens only), is one component of a comprehensive MRSA colonization surveillance program. It is not intended to diagnose MRSA infection nor to guide or monitor treatment for MRSA infections. Performed at [facility], [address] [place], [place] [address]   CULTURE, BLOOD (ROUTINE X 2) w Reflex to ID Panel     Status: None (Preliminary result)   Collection Time: 10/03/19  5:49 PM   Specimen: BLOOD  Result Value Ref Range Status   Specimen Description BLOOD BLOOD RIGHT HAND  Final   Special Requests   Final    BOTTLES DRAWN AEROBIC AND ANAEROBIC Blood Culture adequate volume   Culture   Final    NO GROWTH 4 DAYS Performed at [facility], [address] [place], [place] [address]    Report Status PENDING  Incomplete  CULTURE, BLOOD (ROUTINE X 2) w Reflex to ID Panel     Status: None (Preliminary result)   Collection Time: 10/03/19  6:51 PM   Specimen: BLOOD  Result Value Ref  Range Status   Specimen Description BLOOD BLOOD RIGHT HAND  Final   Special Requests   Final    BOTTLES DRAWN AEROBIC AND ANAEROBIC Blood Culture results may not be optimal due to an excessive volume of blood received in culture bottles   Culture   Final     NO GROWTH 4 DAYS Performed at [facility], [address], [place],  [address]    Report Status PENDING  Incomplete   *Note: Due to a large number of results and/or encounters for the requested time period, some results have not been displayed. A complete set of results can be found in Results Review.    Coagulation Studies: Recent Labs    10/05/19 0601 10/06/19 0459 10/07/19 0532  LABPROT 19.5* 19.9* 23.0*  INR 1.7* 1.8* 2.1*    Urinalysis: No results for input(s): COLORURINE, LABSPEC, PHURINE, GLUCOSEU, HGBUR, BILIRUBINUR, KETONESUR, PROTEINUR, UROBILINOGEN, NITRITE, LEUKOCYTESUR in the last 72 hours.  Invalid input(s): APPERANCEUR    Imaging: CT ANGIO AO+BIFEM W & OR WO CONTRAST  Result Date: 10/05/2019 CLINICAL DATA:  Spontaneous hemorrhage of the left thigh. Left femoral pseudoaneurysm status post thrombin injection. Status post stent placement. EXAM: CT ANGIOGRAPHY OF ABDOMINAL AORTA WITH ILIOFEMORAL RUNOFF TECHNIQUE: Multidetector CT imaging of the abdomen, pelvis and lower extremities was performed using the standard protocol during bolus administration of intravenous contrast. Multiplanar CT image reconstructions and MIPs were obtained to evaluate the vascular anatomy. CONTRAST:  196mL OMNIPAQUE IOHEXOL 350 MG/ML SOLN COMPARISON:  None. FINDINGS: VASCULAR Aorta: There are extensive atherosclerotic changes throughout the abdominal aorta without evidence for an aneurysm. Celiac: There is a common origin of the SMA and celiac axis, a normal variant. The artery is widely patent. Renals: Both renal arteries are occluded. IMA: There is moderate narrowing of the proximal IMA. RIGHT Lower Extremity Inflow: There are atherosclerotic changes of the right common iliac and right internal iliac arteries, however they remain widely patent. Outflow: The right common femoral artery demonstrates advanced atherosclerotic changes resulting in likely greater than 50% stenosis.  There are atherosclerotic changes throughout the remaining portions of the right SFA and popliteal arteries resulting in less than 50% stenosis. Runoff: The tibial vasculature is highly calcified which limits evaluation. The proximal AT is patent. The mid to distal AT is entirely occluded. The right peroneal and posterior tibial arteries appear to be patent proximally. Distally, the arteries are difficult to fully evaluate. The peroneal artery appears to be occluded just above the ankle (axial series 5, image 210). The dorsalis pedis artery is occluded. There appears to be a patent medial and lateral plantar arch. LEFT Lower Extremity Inflow: There are atherosclerotic changes of the left common iliac artery and left external iliac arteries without evidence for high-grade stenosis. 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 Outflow: There are atherosclerotic changes of the left common femoral artery resulting in greater than 50% stenosis. There are patent stents within the proximal left SFA and profunda femoris arteries. There are atherosclerotic changes throughout the left SFA and profunda femoris arteries resulting in less than 50% stenosis. Runoff: The proximal anterior tibial artery is patent. However, there appear to be tandem areas of high-grade stenosis for example axial series 5, image 158). The proximal posterior tibial and peroneal arteries appear to be patent, however evaluation is severely limited by heavy atherosclerotic changes and calcifications. Veins: Review of the MIP images confirms the above findings. NON-VASCULAR Lower  chest: There is atelectasis at the lung bases.The heart is significantly enlarged. Hepatobiliary: The liver is normal. Cholelithiasis without acute inflammation.There is no biliary ductal dilation. Pancreas: Normal contours without ductal dilatation. No peripancreatic fluid collection. Spleen: The spleen is  enlarged measuring approximately 13 cm craniocaudad. Adrenals/Urinary Tract: --Adrenal glands: Unremarkable. 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 --Left kidney/ureter: The native left kidney is absent. 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 --Urinary bladder: The urinary bladder is underdistended which significantly limits evaluation. Stomach/Bowel: --Stomach/Duodenum: No hiatal hernia or other gastric abnormality. Normal duodenal course and caliber. --Small bowel: Unremarkable. --Colon: There is some new apparent mild wall thickening of the right hemicolon. This is felt to be secondary to underdistention. --Appendix: Normal. Lymphatic: --No retroperitoneal lymphadenopathy. --No mesenteric lymphadenopathy. --No pelvic or inguinal lymphadenopathy. Reproductive: There is a large left-sided hydrocele. Other: There is a trace amount of free fluid in the abdomen and pelvis. The abdominal wall is normal. Musculoskeletal. 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 There is a posterior disc osteophyte complex at the L5-S1 level without significant spinal canal narrowing. 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 There is a large left thigh intramuscular hematoma without evidence for active arterial extravasation. This hematoma is relatively stable in size when compared to prior study currently measuring 14.3 x 6.4 cm (previously measuring 13.6 x 8 cm). There is no definite pseudoaneurysm. There is a metallic clip in the anterior aspect of the left thigh likely related to a failed closure device. Extensive left lower extremity edema is noted. There are moderate-sized bilateral suprapatellar joint effusions, left greater than right. IMPRESSION: 1. Again noted is a large left thigh intramuscular hematoma which is essentially stable from prior study. There is no evidence for active arterial extravasation on this study. No  evidence for a pseudoaneurysm. 2. Extensive atherosclerotic disease as detailed above. There appears to be a two or single-vessel runoff bilaterally, however evaluation is severely limited by advanced calcific atherosclerotic disease below the knees. 3. Patent stents within the left SFA and profunda femoris arteries. 4. Multiple additional chronic findings are noted in the abdomen and pelvis as detailed above, not substantially changed from recent prior study. Aortic Atherosclerosis (ICD10-I70.0). Electronically Signed   By: Constance Holster M.D.   On: 10/05/2019 21:41   US Venous Img Lower Bilateral (DVT)  Result Date: 10/05/2019 CLINICAL DATA:  Bilateral lower extremity edema the past week left greater than right. Patient is currently on anticoagulation. Recent vascular surgery with arterial stenting. Recent thrombin injection. Evaluate for DVT. EXAM: BILATERAL LOWER EXTREMITY VENOUS DOPPLER ULTRASOUND TECHNIQUE: Gray-scale sonography with graded compression, as well as color Doppler and duplex ultrasound were performed to evaluate the lower extremity deep venous systems from the level of the common femoral vein and including the common femoral, femoral, profunda femoral, popliteal and calf veins including the posterior tibial, peroneal and gastrocnemius veins when visible. The superficial great saphenous vein was also interrogated. Spectral Doppler was utilized to evaluate flow at rest and with distal augmentation maneuvers in the common femoral, femoral and popliteal veins. COMPARISON:  CT abdomen and pelvis-10/01/2019 FINDINGS: RIGHT LOWER EXTREMITY Common Femoral Vein: No evidence of thrombus. Normal compressibility, respiratory phasicity and response to augmentation. Saphenofemoral Junction: No evidence of thrombus. Normal compressibility and flow on color Doppler imaging. Profunda Femoral Vein: No evidence of thrombus. Normal compressibility and flow on color Doppler imaging. Femoral Vein: No evidence  of thrombus. Normal compressibility,  respiratory phasicity and response to augmentation. Popliteal Vein: No evidence of thrombus. Normal compressibility, respiratory phasicity and response to augmentation. Calf Veins: No evidence of thrombus. Normal compressibility and flow on color Doppler imaging. Superficial Great Saphenous Vein: No evidence of thrombus. Normal compressibility. Venous Reflux:  None. Other Findings:  None. LEFT LOWER EXTREMITY Common Femoral Vein: No evidence of thrombus. Normal compressibility, respiratory phasicity and response to augmentation. Saphenofemoral Junction: No evidence of thrombus. Normal compressibility and flow on color Doppler imaging. Profunda Femoral Vein: No evidence of thrombus. Normal compressibility and flow on color Doppler imaging. Femoral Vein: No evidence of thrombus. Normal compressibility, respiratory phasicity and response to augmentation. Popliteal Vein: No evidence of thrombus. Normal compressibility, respiratory phasicity and response to augmentation. Calf Veins: No evidence of thrombus. Normal compressibility and flow on color Doppler imaging. Superficial Great Saphenous Vein: No evidence of thrombus. Normal compressibility. Venous Reflux:  None. Other Findings: There is a large (at least 16.0 x 12.0 x 1.4 cm) complex fluid collection within the subcutaneous tissues about the proximal aspect the left thigh. No definitive blood flow is demonstrated within this collection. Additionally, the collection does not definitively communicate with any of the adjacent vascular structures. IMPRESSION: 1. No evidence of DVT within either lower extremity. 2. Approximately 16 cm complex fluid collection within the subcutaneous tissues the proximal aspect of the left thigh, nonspecific though worrisome for a hematoma, as was better demonstrated on preceding abdominal CT performed 10/01/2019. No definitive blood flow is demonstrated within the collection and it does not appear to  communicate with any of the adjacent vascular structures though this is somewhat difficult to evaluate given the large size of the collection. Electronically Signed   By: Sandi Mariscal M.D.   On: 10/05/2019 11:44     Medications:    . sodium chloride   Intravenous Once  . allopurinol  300 mg Oral Daily  . Chlorhexidine Gluconate Cloth  6 each Topical Q0600  . cholecalciferol  1,000 Units Oral Daily  . cinacalcet  30 mg Oral QODAY  . epoetin (EPOGEN/PROCRIT) injection  10,000 Units Intravenous Q M,W,F-HD  . ezetimibe  10 mg Oral Daily  . hydroxychloroquine  200 mg Oral BID  . loratadine  10 mg Oral Daily  . metoprolol tartrate  2.5 mg Intravenous Once  . metoprolol tartrate  25 mg Oral Q6H  . midodrine  10 mg Oral Once per day on Mon Wed Fri  . multivitamin  1 tablet Oral Daily  . pantoprazole  40 mg Oral Daily  . rosuvastatin  40 mg Oral q1800  . sevelamer carbonate  4,000 mg Oral TID WC  . warfarin  1 mg Oral Daily  . Warfarin - Physician Dosing Inpatient   Does not apply q1600  . zinc sulfate  220 mg Oral Daily   acetaminophen **OR** acetaminophen, bisacodyl, melatonin, nitroGLYCERIN, ondansetron (ZOFRAN) IV, oxyCODONE, polyethylene glycol, simethicone  Assessment/ Plan:  [name] is a 64 y.o. 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 Left AVF 85kg   1. End stage renal disease: seen and examined  on hemodialysis treatment.  Tolerating treatment well.   2. Hypotension with atrial flutter. On metoprolol as per cardiology.  - Continue midodrine  3. Anemia of chronic kidney disease: PRBC transfusion for today.  Acute blood loss anemia from hematoma.  - EPO with HD treatment - PRBC transfusion on 6/28  4. Secondary Hyperparathyroidism: - cinacalcet - sevelamer  LOS: 6 Cortlan Dolin 7/2/202111:12 AM

## 2019-10-07 NOTE — Progress Notes (Signed)
Clinic called today, patient chair time is changing to MWF 9:25am next week. Patient is aware.

## 2019-10-07 NOTE — Discharge Summary (Addendum)
[facility]    Discharge Summary  Patient ID:  Jeremy [name] MRN: [id number] DOB/AGE: Jan 24, 1956 65 y.o.  Admit date: 09/29/2019 Discharge date: 10/07/2019 Date of Surgery: 09/29/2019 Surgeon: Surgeon(s): Lucky Cowboy Erskine Squibb, MD  Admission Diagnosis: Postprocedural hypotension [I95.81] Acute post-operative pain [G89.18] Hematoma of thigh, initial encounter [S70.10XA] Thigh hematoma [S70.10XA]  Discharge Diagnoses:  Postprocedural hypotension [I95.81] Acute post-operative pain [G89.18] Hematoma of thigh, initial encounter [S70.10XA] Thigh hematoma [S70.10XA]  Secondary Diagnoses: Past Medical History:  Diagnosis Date  . Abnormal stress test 03/19/2018  . 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 Aortic stenosis    a. 2014 s/p mech AVR ([facility])-->chronic coumadin; b. 08/2018 Echo: Mild AS/AI. Nl fxn/gradient.  . Arthritis   . Atrial fibrillation ([place])   . CAD (coronary artery disease)    a. 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 02/2018 MV [facility]): large inf, infsept, inflat infarct w/ min rev, EF 30%; c. 06/2018 Cath: LM min irregs, LAD 60p, 95/30/41m, LCX min irregs, RCA 40p, 41m, 90d, RPAV 90, RPDA 95ost; d. 09/2018 CABGx3 (LIMA->LAD, VG->D1, VG->PDA).  . Chronic anticoagulation 06/2012   a. Coumadin in the setting of mech AVR and afib.  Marland Kitchen COVID-19 03/15/2019  . CPAP (continuous positive airway pressure) dependence   . Dialysis patient [facility]) 04/07/2006   Patient started HD around 1996,  had 1st transplant LLQ around 1998 until 2007 when they found renal cell cancer in transplant and both native kidneys, all were removed > went back on HD until 2nd transplant RLQ in 2014 which lasted 3 yrs; it was embolized for suspected acute rejection. Is currently on HD MWF in [place] w/ [facility] nephrology.  . ESRD (end stage renal disease) ([place])    a. 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 1999 s/p Kidney  transplant; c. 2014 Bilat nephrectomy (transplanted kidney resected) in the setting of renal cell carcinoma; d. PD until 11/2017-->HD.  Marland Kitchen Heart murmur   . HFrEF (heart failure with reduced ejection fraction) ([place])    a. 07/2016 Echo: EF 55-60%; b. 02/2018 MV: EF 30%; c. 04/2018 Echo: EF 35%; d. 08/2018 Echo: EF 30-35%. Glob HK w/o rwma. Mildly reduced RV fxn. Mod to sev dil LA. Nl AoV fxn.  Marland Kitchen Hx of colonoscopy    2009 by Dr. Laural Golden. Normal except for small submucosal lipoma. No evidence of polyps or colitis.  . Hyperlipidemia   . Hypertension   . Hypogonadism in male 07/25/2015  . Ischemic cardiomyopathy    a. 07/2016 Echo: EF 55-60%; b. 02/2018 MV: EF 30%; c. 04/2018 Echo: EF 35%; d. 08/2018 Echo: EF 30-35%.  . OSA (obstructive sleep apnea)    No CPAP  . Permanent atrial fibrillation ([facility])    a. CHA2DS2VASc = 3-->Coumadin (also mech AVR); b. 06/2018 Amio started 2/2 elevated rates.  . Renal cell cancer ([place])   . Renal cell carcinoma ([place]) 08/04/2016   Procedure(s): 09/29/19:  1. Thrombin injection leftfemoral artery pseudoaneurysm with ultrasound guidance  With take back to angiography suite for:  1.Ultrasound guidance for vascular accessrightfemoral artery 2.Catheter placement into left superficial femoral and profunda femoris artery 3.Aortogram and selectiveleftlower extremity angiogramoccluding selective images of the profunda and superficial femoral arteries 4.Covered stent placement to the left superficial femoral artery with 6 mm diameter by 5 and 6 mm diameter by 2.5 cm length Viabahn stents 5.Covered stent placement to the left profunda femoris artery  with 6 mm diameter by 2.5 cm length Viabahn stent 6.Extension of the stents bilaterally up into the distal common femoral artery with a 6 mm diameter by 7.5 cm length Viabahn stent in the superficial femoral artery and a 6 mm diameter by 5 cm  length Viabahn stent in the profunda femoris artery going about 1 to 1-1/2 cm into the common femoral artery distally 7.StarClose closure device rightfemoral artery  Discharged Condition: Good  HPI / Hospital Course:  Jeremy [name] is a 64 y.o. 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  The anatomy appears favorable for a thrombin injection to treat the pseudoaneurysm with a relatively thin long neck.  Risks and benefits were discussed and informed consent was obtained.  On September 29, 2019, the patient underwent:  Thrombin injection leftfemoral artery pseudoaneurysm with ultrasound guidance  Patient tolerated the procedure well and was discharged home.  A few hours after being discharged home the patient returned to our emergency department complaining of progressively worsening left lower extremity pain and swelling.  Patient was found to have a hematoma and returned to the angiography suite and underwent:  1.Ultrasound guidance for vascular accessrightfemoral artery 2.Catheter placement into left superficial femoral and profunda femoris artery 3.Aortogram and selectiveleftlower extremity angiogramoccluding selective images of the profunda and superficial femoral arteries 4.Covered stent placement to the left superficial femoral artery with 6 mm diameter by 5 and 6 mm diameter by 2.5 cm length Viabahn stents 5.Covered stent placement to the left profunda femoris artery with 6 mm diameter by 2.5 cm length Viabahn stent 6.Extension of the stents bilaterally up into the distal common femoral artery with a 6 mm diameter by 7.5 cm length Viabahn stent in the superficial femoral artery and a 6 mm diameter by 5 cm length Viabahn stent in the profunda femoris artery going about 1 to 1-1/2 cm into the common femoral artery  distally 7.StarClose closure device rightfemoral artery  The patient tolerated procedure well and was transferred to the surgical floor for continued monitoring and observation.  During the patient's stay, his lab work was monitored closely and he received a blood transfusion during dialysis.  The patient was also followed by cardiology as well as the hospitalist team to help manage his chronic A. fib.  During his inpatient stay, his diet was advanced, his discomfort was controlled with the use of p.o. pain medications and he was ambulating at baseline.  The patient was followed by nephrology and received dialysis as per his normal routine without issue.  Day of discharge, the patient was afebrile with stable vital signs and improving physical exam.  Physical exam:  A&Ox3, NAD CV: RRR Pulmonary: CTA Bilaterally Abdomen: Soft, Nontender, Nondistended Vascular: Left lower extremity:Thigh soft. Calf soft. Extremities warm distally to toes. Good capillary refill. Hematoma noted to mid thigh. Motor Madelyn Brunner is intact.  Labs: As below  Complications: None  Consults:  Treatment Team:  Nelva Bush, MD Fritzi Mandes, MD  Nephrology  Significant Diagnostic Studies: CBC Lab Results  Component Value Date   WBC 5.8 10/07/2019   HGB 7.1 (L) 10/07/2019   HCT 22.6 (L) 10/07/2019   MCV 102.3 (H) 10/07/2019   PLT 157 10/07/2019   BMET    Component Value Date/Time   NA 139 10/07/2019 0532   NA 143 04/14/2019 0000   K 4.3 10/07/2019 0532   CL 94 (L) 10/07/2019 0532   CO2 30 10/07/2019 0532   GLUCOSE 90 10/07/2019  0532   BUN 54 (H) 10/07/2019 0532   BUN 20 04/14/2019 0000   CREATININE 9.41 (H) 10/07/2019 0532   CALCIUM 9.0 10/07/2019 0532   CALCIUM 9.2 12/14/2015 1059   GFRNONAA 5 (L) 10/07/2019 0532   GFRAA 6 (L) 10/07/2019 0532   COAG Lab Results  Component Value Date   INR 2.1 (H) 10/07/2019   INR 1.8 (H) 10/06/2019   INR 1.7 (H)  10/05/2019   Disposition:  Discharge to :Home  Allergies as of 10/07/2019      Reactions   Statins    Other reaction(s): Arthralgias (intolerance), Myalgias (intolerance) Pt taking pravastatin    Sertraline Rash      Medication List    TAKE these medications   acetaminophen 500 MG tablet Commonly known as: TYLENOL Take 1,000 mg 2 (two) times daily as needed by mouth for moderate pain.   allopurinol 300 MG tablet Commonly known as: ZYLOPRIM Take 300 mg by mouth daily.   cholecalciferol 25 MCG (1000 UNIT) tablet Commonly known as: VITAMIN D3 Take 1,000 Units by mouth daily.   cinacalcet 30 MG tablet Commonly known as: SENSIPAR Take 30 mg by mouth every other day.   clopidogrel 75 MG tablet Commonly known as: PLAVIX Take 1 tablet (75 mg total) by mouth daily with breakfast.   ezetimibe 10 MG tablet Commonly known as: ZETIA Take 1 tablet (10 mg total) by mouth daily.   hydroxychloroquine 200 MG tablet Commonly known as: PLAQUENIL Take 200 mg by mouth 2 (two) times daily.   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 Monday, Wednesday, Friday for dialysis   loratadine 10 MG tablet Commonly known as: CLARITIN Take 10 mg by mouth daily.   metoprolol tartrate 50 MG tablet Commonly known as: Lopressor Take 1 tablet (50 mg total) by mouth 2 (two) times daily. What changed:   medication strength  how much to take  when to take this   multivitamin Tabs tablet Take 1 tablet by mouth daily.   nitroGLYCERIN 0.4 MG SL tablet Commonly known as: NITROSTAT Place 1 tablet (0.4 mg total) under the tongue every 5 (five) minutes as needed for chest pain.   oxyCODONE-acetaminophen 5-325 MG tablet Commonly known as: PERCOCET/ROXICET Take 1-2 tablets by mouth every 6 (six) hours as needed for severe pain.   pantoprazole 40 MG tablet Commonly known as: PROTONIX Take 1 tablet (40 mg total) by mouth daily.    RETACRIT IJ Epoetin alfa - epbx (Retacrit)   rosuvastatin 40 MG tablet Commonly known as: CRESTOR Take 1 tablet (40 mg total) by mouth daily at 6 PM.   sevelamer carbonate 800 MG tablet Commonly known as: RENVELA Take 4,000 mg by mouth 3 (three) times daily with meals. 5 tabs per meal   warfarin 1 MG tablet Commonly known as: COUMADIN Take as directed. If you are unsure how to take this medication, talk to your nurse or doctor. Original instructions: Take 1 mg by mouth daily.   zinc sulfate 220 (50 Zn) MG capsule Take 220 mg by mouth daily.      Verbal and written Discharge instructions given to the patient. Wound care per Discharge AVS  Follow-up Information    Dew, Erskine Squibb, MD Follow up in 2 week(s).   Specialties: Vascular Surgery, Radiology, Interventional Cardiology Why: Can see Dew or Arna Medici. Will need left lower extremity arterial duplex with visit.  Contact information: [name] [address] [phone]  Leone Haven, MD Follow up in 1 week(s).   Specialty: Family Medicine Why: To check CBC. Received blood tranfusions for hematoma during hospitalization.  Contact information: [place] [address] [phone]        Wellington Hampshire, MD .   Specialty: Cardiology Contact information: [place] [address] [phone]              Signed: Sela Hua, PA-C  10/07/2019, 1:40 PM

## 2019-10-07 NOTE — Progress Notes (Signed)
Physical Therapy Treatment Patient Details Name: Jeremy [name] MRN: [id number] DOB: Apr 23, 1955 Today's Date: 10/07/2019    History of Present Illness Jeremy [name] is a 47yoM who comes to [facility] several hours after scheduled vascular procedure at left femoral artery on 09/29/19, pt reporting increasing pain and swelling of Left thigh. ESRD on HD MWF, HTN, gout, s/p aortic valve replacement, AF, HLD, CAD, CHF, OSA, anemia of CKD, hyperparathyroidism.    PT Comments    Pt in bed upon entry agreeable to PT session. Pt reports AMB with family last night, trying to AMB daily to maintain strength. Pt able to progress AMB this date >2x compared to Tuesday's session, HR better controlled. Pt fatigued at end of gait, likely multifactorial including ABLA 7s. WOrked on STS squats for hip strengthening, but pt requires surface height of 25" to perform hands free.    Follow Up Recommendations  Supervision - Intermittent;Outpatient PT     Equipment Recommendations  Rolling walker with 5" wheels    Recommendations for Other Services       Precautions / Restrictions Precautions Precautions: Fall Restrictions RLE Weight Bearing: Weight bearing as tolerated LLE Weight Bearing: Weight bearing as tolerated    Mobility  Bed Mobility Overal bed mobility: Modified Independent                Transfers Overall transfer level: Modified independent Equipment used: None                Ambulation/Gait   Gait Distance (Feet): 260 Feet Assistive device: Rolling walker (2 wheeled) Gait Pattern/deviations: WFL(Within Functional Limits) Gait velocity: 0.51m/s   General Gait Details: HR 107 during gait   Stairs             Wheelchair Mobility    Modified Rankin (Stroke Patients Only)       Balance                                            Cognition Arousal/Alertness: Awake/alert Behavior During Therapy: WFL for tasks assessed/performed Overall Cognitive  Status: Within Functional Limits for tasks assessed                                        Exercises      General Comments        Pertinent Vitals/Pain Pain Assessment: 0-10 Pain Score: 5  Pain Location: Left thigh Pain Intervention(s): Limited activity within patient's tolerance;Monitored during session    Home Living                      Prior Function            PT Goals (current goals can now be found in the care plan section) Acute Rehab PT Goals Patient Stated Goal: improve LLE strength, reduce scrotal edema PT Goal Formulation: With patient Time For Goal Achievement: 10/18/19 Potential to Achieve Goals: Good Progress towards PT goals: Progressing toward goals    Frequency    Min 2X/week      PT Plan Current plan remains appropriate    Co-evaluation              AM-PAC PT "6 Clicks" Mobility   Outcome Measure  Help needed turning from your back to your side  while in a flat bed without using bedrails?: A Little Help needed moving from lying on your back to sitting on the side of a flat bed without using bedrails?: A Little Help needed moving to and from a bed to a chair (including a wheelchair)?: A Little Help needed standing up from a chair using your arms (e.g., wheelchair or bedside chair)?: A Little Help needed to walk in hospital room?: A Little Help needed climbing 3-5 steps with a railing? : A Little 6 Click Score: 18    End of Session Equipment Utilized During Treatment: Gait belt Activity Tolerance: Patient tolerated treatment well;Patient limited by pain Patient left: in bed;with call bell/phone within reach Nurse Communication: Mobility status PT Visit Diagnosis: Other abnormalities of gait and mobility (R26.89);Difficulty in walking, not elsewhere classified (R26.2);Unsteadiness on feet (R26.81)     Time: 4332-9518 PT Time Calculation (min) (ACUTE ONLY): 15 min  Charges:  $Therapeutic Exercise: 8-22  mins                     12:20 PM, 10/07/19 Etta Grandchild, PT, DPT Physical Therapist - [facility]  [phone] ([place])    [place] C 10/07/2019, 12:17 PM

## 2019-10-07 NOTE — Progress Notes (Signed)
Progress Note  Patient Name: Jeremy [name] Date of Encounter: 10/07/2019  Primary Cardiologist: [name]  Subjective   Reports that he feels well, denies any significant shortness of breath tachycardia or palpitations Remains on metoprolol 25 every 6, plans to transition to 50 twice daily Heart rate on telemetry 90 to 100 bpm Hemoglobin 7.1, stable  Inpatient Medications    Scheduled Meds: . sodium chloride   Intravenous Once  . allopurinol  300 mg Oral Daily  . Chlorhexidine Gluconate Cloth  6 each Topical Q0600  . cholecalciferol  1,000 Units Oral Daily  . cinacalcet  30 mg Oral QODAY  . epoetin (EPOGEN/PROCRIT) injection  10,000 Units Intravenous Q M,W,F-HD  . ezetimibe  10 mg Oral Daily  . hydroxychloroquine  200 mg Oral BID  . loratadine  10 mg Oral Daily  . metoprolol tartrate  2.5 mg Intravenous Once  . metoprolol tartrate  25 mg Oral Q6H  . midodrine  10 mg Oral Once per day on Mon Wed Fri  . multivitamin  1 tablet Oral Daily  . pantoprazole  40 mg Oral Daily  . rosuvastatin  40 mg Oral q1800  . sevelamer carbonate  4,000 mg Oral TID WC  . warfarin  1 mg Oral Daily  . Warfarin - Physician Dosing Inpatient   Does not apply q1600  . zinc sulfate  220 mg Oral Daily   Continuous Infusions:  PRN Meds: acetaminophen **OR** acetaminophen, bisacodyl, melatonin, nitroGLYCERIN, ondansetron (ZOFRAN) IV, oxyCODONE, polyethylene glycol, simethicone   Vital Signs    Vitals:   10/07/19 1212 10/07/19 1215 10/07/19 1230 10/07/19 1245  BP:  122/76 108/67 116/67  Pulse: (!) 107 99 94 (!) 101  Resp:  19 17 18   Temp:      TempSrc:      SpO2:      Weight:      Height:        Intake/Output Summary (Last 24 hours) at 10/07/2019 1326 Last data filed at 10/06/2019 1340 Gross per 24 hour  Intake 240 ml  Output --  Net 240 ml   Filed Weights   10/04/19 0423 10/05/19 0500 10/07/19 0500  Weight: 89 kg 89.4 kg 87.8 kg    Telemetry    Atrial flutter with rate 90 to 100 bpm  personally Reviewed  ECG    No new tracings - Personally Reviewed  Physical Exam   Constitutional:  oriented to person, place, and time. No distress.  HENT:  Head: Grossly normal Eyes:  no discharge. No scleral icterus.  Neck: No JVD, no carotid bruits  Cardiovascular: Irregularly irregular,  no murmurs appreciated Pulmonary/Chest: Clear to auscultation bilaterally, no wheezes or rails Abdominal: Soft.  no distension.  no tenderness.  Musculoskeletal: Normal range of motion Neurological:  normal muscle tone. Coordination normal. No atrophy Skin: Skin warm and dry Psychiatric: normal affect, pleasant   Labs    Chemistry Recent Labs  Lab 10/01/19 0358 10/01/19 0358 10/02/19 0517 10/02/19 0517 10/03/19 0348 10/04/19 0532 10/05/19 0601 10/06/19 0459 10/07/19 0532  NA 140   < > 138   < > 135   < > 139 138 139  K 4.2   < > 5.1   < > 5.3*   < > 4.4 4.0 4.3  CL 98   < > 96*   < > 93*   < > 95* 94* 94*  CO2 31   < > 29   < > 28   < > 30  31 30  GLUCOSE 97   < > 88   < > 91   < > 106* 89 90  BUN 24*   < > 42*   < > 56*   < > 56* 36* 54*  CREATININE 5.98*   < > 8.77*   < > 10.81*   < > 9.96* 7.09* 9.41*  CALCIUM 8.0*   < > 8.9   < > 8.3*   < > 8.8* 9.0 9.0  PROT 5.5*  --  5.9*  --  5.8*  --   --   --   --   ALBUMIN 2.7*  --  2.8*  --  2.6*  --   --   --   --   AST 23  --  48*  --  36  --   --   --   --   ALT 11  --  12  --  10  --   --   --   --   ALKPHOS 71  --  77  --  85  --   --   --   --   BILITOT 1.1  --  1.3*  --  0.9  --   --   --   --   GFRNONAA 9*   < > 6*   < > 5*   < > 5* 8* 5*  GFRAA 11*   < > 7*   < > 5*   < > 6* 9* 6*  ANIONGAP 11   < > 13   < > 14   < > 14 13 15    < > = values in this interval not displayed.     Hematology Recent Labs  Lab 10/05/19 0601 10/06/19 0459 10/07/19 0532  WBC 5.8 5.8 5.8  RBC 2.31* 2.26* 2.21*  HGB 7.4* 7.1* 7.1*  HCT 23.5* 23.1* 22.6*  MCV 101.7* 102.2* 102.3*  MCH 32.0 31.4 32.1  MCHC 31.5 30.7 31.4  RDW 17.6* 17.2*  16.7*  PLT 123* 142* 157    Cardiac EnzymesNo results for input(s): TROPONINI in the last 168 hours. No results for input(s): TROPIPOC in the last 168 hours.   BNPNo results for input(s): BNP, PROBNP in the last 168 hours.   DDimer No results for input(s): DDIMER in the last 168 hours.   Radiology    CT ANGIO AO+BIFEM W & OR WO CONTRAST  Result Date: 10/05/2019 IMPRESSION: 1. Again noted is a large left thigh intramuscular hematoma which is essentially stable from prior study. There is no evidence for active arterial extravasation on this study. No evidence for a pseudoaneurysm. 2. Extensive atherosclerotic disease as detailed above. There appears to be a two or single-vessel runoff bilaterally, however evaluation is severely limited by advanced calcific atherosclerotic disease below the knees. 3. Patent stents within the left SFA and profunda femoris arteries. 4. Multiple additional chronic findings are noted in the abdomen and pelvis as detailed above, not substantially changed from recent prior study. Aortic Atherosclerosis (ICD10-I70.0). Electronically Signed   By: Constance Holster M.D.   On: 10/05/2019 21:41   US Venous Img Lower Bilateral (DVT)  Result Date: 10/05/2019 IMPRESSION: 1. No evidence of DVT within either lower extremity. 2. Approximately 16 cm complex fluid collection within the subcutaneous tissues the proximal aspect of the left thigh, nonspecific though worrisome for a hematoma, as was better demonstrated on preceding abdominal CT performed 10/01/2019. No definitive blood flow is demonstrated within the collection and  it does not appear to communicate with any of the adjacent vascular structures though this is somewhat difficult to evaluate given the large size of the collection. Electronically Signed   By: Sandi Mariscal M.D.   On: 10/05/2019 11:44    Cardiac Studies   2D echo 10/01/2019: 1. Left ventricular ejection fraction, by estimation, is 45 to 50%. The  left  ventricle has mildly decreased function. The left ventricle  demonstrates regional wall motion abnormalities (see scoring  diagram/findings for description). There is moderate  left ventricular hypertrophy. Left ventricular diastolic parameters are  indeterminate. There is moderate hypokinesis of the left ventricular,  basal-mid inferior wall and inferolateral wall.  2. Right ventricular systolic function is normal. The right ventricular  size is normal. Tricuspid regurgitation signal is inadequate for assessing  PA pressure.  3. Left atrial size was moderately dilated.  4. Right atrial size was mildly dilated.  5. The mitral valve is normal in structure. No evidence of mitral valve  regurgitation. No evidence of mitral stenosis.  6. The aortic valve has been repaired/replaced. Aortic valve  regurgitation is not visualized. No aortic stenosis is present. There is a  St. Jude valve present in the aortic position. Aortic valve mean gradient  measures 11.0 mmHg.  Patient Profile     64 y.o. male with history of CAD status post three-vessel CABG in 09/2018 followed by subsequent PCI/DES to the SVG to RPDA in 07/2019, HFrEF secondary to ICM, persistentA. 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  Assessment & Plan    1. CAD involving the native coronary arteries status post CABG status post subsequent PCI to the SVG to RPDA  NSTEMI: secondary to demand  ischemia in the setting of severe multivessel native CAD with subsequent disease of the bypass grafts with recent vascular intervention complicated by pseudoaneurysm requiring repair x 2,  ----subsequent anemia requiring pRBC, hypotension, fever no plans for LHC at this time  2. Persistent A. fib with RVR/atrial flutter: -Rate improved, INR 2.1 on warfarin Transition to metoprolol tartrate 50 twice daily  3.HFrEF secondary to ICM: -Volume managed by HD -On dialysis during rounds today, managed by nephrology  4.PAD status post recent intervention complicated by pseudoaneurysm status post thrombin injection status post stenting: -Management per vascular surgery   5.  Acute blood loss anemia Also with history of anemia of chronic disease Discussed with nephrology  will receive blood transfusion today if timing allows, during dialysis,   6.Severe AS status post mechanical AVR: Warfarin, aspirin 81 mg daily Close follow-up in anticoagulation clinic  7. Hyperkalemia/ESRD: Receiving hemodialysis today  8.HTN: Metoprolol 50 twice daily for rhythm control, blood pressure low but stable, asymptomatic  9.HLD: -Zetia  Complicated patient, discussed with nephrology and Medications reviewed with vascular surgery  Total encounter time more than 35 minutes  Greater than 50% was spent in counseling and coordination of care with the patient  For questions or updates, please contact [place] Please consult [email or web address] for contact info under Cardiology/STEMI.  Signed, Esmond Plants, MD, Ph.D [facility]

## 2019-10-07 NOTE — Progress Notes (Signed)
Jeremy [name]  A and O x 4. VSS. Pt tolerating diet well. No complaints of pain or nausea. IV removed intact, prescriptions given. Pt voiced understanding of discharge instructions with no further questions. Pt discharged via wheelchair with NT.    Allergies as of 10/07/2019      Reactions   Statins    Other reaction(s): Arthralgias (intolerance), Myalgias (intolerance) Pt taking pravastatin    Sertraline Rash      Medication List    TAKE these medications   acetaminophen 500 MG tablet Commonly known as: TYLENOL Take 1,000 mg 2 (two) times daily as needed by mouth for moderate pain.   allopurinol 300 MG tablet Commonly known as: ZYLOPRIM Take 300 mg by mouth daily.   cholecalciferol 25 MCG (1000 UNIT) tablet Commonly known as: VITAMIN D3 Take 1,000 Units by mouth daily.   cinacalcet 30 MG tablet Commonly known as: SENSIPAR Take 30 mg by mouth every other day.   clopidogrel 75 MG tablet Commonly known as: PLAVIX Take 1 tablet (75 mg total) by mouth daily with breakfast.   ezetimibe 10 MG tablet Commonly known as: ZETIA Take 1 tablet (10 mg total) by mouth daily.   hydroxychloroquine 200 MG tablet Commonly known as: PLAQUENIL Take 200 mg by mouth 2 (two) times daily.   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 Monday, Wednesday, Friday for dialysis   loratadine 10 MG tablet Commonly known as: CLARITIN Take 10 mg by mouth daily.   metoprolol tartrate 50 MG tablet Commonly known as: Lopressor Take 1 tablet (50 mg total) by mouth 2 (two) times daily. What changed:   medication strength  how much to take  when to take this   multivitamin Tabs tablet Take 1 tablet by mouth daily.   nitroGLYCERIN 0.4 MG SL tablet Commonly known as: NITROSTAT Place 1 tablet (0.4 mg total) under the tongue every 5 (five) minutes as needed for chest pain.   oxyCODONE-acetaminophen 5-325 MG tablet Commonly known as:  PERCOCET/ROXICET Take 1-2 tablets by mouth every 6 (six) hours as needed for severe pain.   pantoprazole 40 MG tablet Commonly known as: PROTONIX Take 1 tablet (40 mg total) by mouth daily.   RETACRIT IJ Epoetin alfa - epbx (Retacrit)   rosuvastatin 40 MG tablet Commonly known as: CRESTOR Take 1 tablet (40 mg total) by mouth daily at 6 PM.   sevelamer carbonate 800 MG tablet Commonly known as: RENVELA Take 4,000 mg by mouth 3 (three) times daily with meals. 5 tabs per meal   warfarin 1 MG tablet Commonly known as: COUMADIN Take as directed. If you are unsure how to take this medication, talk to your nurse or doctor. Original instructions: Take 1 mg by mouth daily.   zinc sulfate 220 (50 Zn) MG capsule Take 220 mg by mouth daily.       Vitals:   10/07/19 1421 10/07/19 1634  BP: 121/77 122/77  Pulse:  (!) 105  Resp:  16  Temp: 99 F (37.2 C) 98.6 F (37 C)  SpO2: 97% 97%    Jeremy [name]

## 2019-10-08 LAB — BPAM RBC
Blood Product Expiration Date: 202107132359
ISSUE DATE / TIME: 202107021357
Unit Type and Rh: 9500

## 2019-10-08 LAB — TYPE AND SCREEN
ABO/RH(D): O POS
Antibody Screen: NEGATIVE
Unit division: 0

## 2019-10-08 LAB — CULTURE, BLOOD (ROUTINE X 2)
Culture: NO GROWTH
Culture: NO GROWTH
Special Requests: ADEQUATE

## 2019-10-09 LAB — LAB REPORT - SCANNED: INR: 2.4

## 2019-10-11 ENCOUNTER — Encounter: Payer: Self-pay | Admitting: *Deleted

## 2019-10-11 ENCOUNTER — Telehealth: Payer: Self-pay | Admitting: *Deleted

## 2019-10-11 ENCOUNTER — Telehealth: Payer: Self-pay | Admitting: Cardiovascular Disease

## 2019-10-11 DIAGNOSIS — G8918 Other acute postprocedural pain: Secondary | ICD-10-CM | POA: Insufficient documentation

## 2019-10-11 DIAGNOSIS — Z955 Presence of coronary angioplasty implant and graft: Secondary | ICD-10-CM

## 2019-10-11 DIAGNOSIS — Z951 Presence of aortocoronary bypass graft: Secondary | ICD-10-CM

## 2019-10-11 DIAGNOSIS — S7010XA Contusion of unspecified thigh, initial encounter: Secondary | ICD-10-CM | POA: Insufficient documentation

## 2019-10-11 NOTE — Telephone Encounter (Signed)
-----   Message from Wellington Hampshire, MD sent at 10/11/2019  2:19 PM EDT ----- Regarding: RE: Return to Cardiac Rehab Unfortunately, he had a large left thigh hematoma and also had myocardial infarction due to severe anemia.  We should wait at least 2 weeks before resuming cardiac rehab.  Thanks.  ----- Message ----- From: Clotilde Dieter Sent: 10/11/2019   7:21 AM EDT To: Wellington Hampshire, MD Subject: Return to Cardiac Rehab                        Dr. [name],  Kjell was discharged on Friday 10/07/19.  He is currently enrolled in Cardiac Rehab.  Please advise as to when he is able to return to rehab.  Thanks! Alberteen Sam, MA, RCEP, CCRP 10/11/2019 7:22 AM

## 2019-10-11 NOTE — Progress Notes (Signed)
Discharge Progress Report  Patient Details  Name: Jeremy [name] MRN: [id number] Date of Birth: 03-Oct-1955 Referring Provider:     Cardiac Rehab from 08/02/2019 in [facility]  Referring Provider Kathlyn Sacramento MD       Number of Visits: 10  Reason for Discharge:  Early Exit:  readmission and home PT  Smoking History:  Social History   Tobacco Use  Smoking Status Never Smoker  Smokeless Tobacco Never Used    Diagnosis:  Status post coronary artery stent placement  S/P CABG x 3  ADL [facility]:   Initial Exercise Prescription:  Initial Exercise Prescription - 08/02/19 0900      Date of Initial Exercise RX and Referring Provider   Date 08/02/19    Referring Provider Kathlyn Sacramento MD      Treadmill   MPH 2.3    Grade 1    Minutes 15    METs 3.08      NuStep   Level 3    SPM 80    Minutes 15    METs 3      REL-XR   Level 3    Speed 50    Minutes 15    METs 3      T5 Nustep   Level 3    SPM 80    Minutes 15    METs 3      Biostep-RELP   Level 3    SPM 50    Minutes 15    METs 3      Prescription Details   Frequency (times per week) 2    Duration Progress to 30 minutes of continuous aerobic without signs/symptoms of physical distress      Intensity   THRR 40-80% of Max Heartrate 116-144    Ratings of Perceived Exertion 11-13    Perceived Dyspnea 0-4      Progression   Progression Continue to progress workloads to maintain intensity without signs/symptoms of physical distress.      Resistance Training   Training Prescription Yes    Weight 3 lb    Reps 10-15           Discharge Exercise Prescription (Final Exercise Prescription Changes):  Exercise Prescription Changes - 09/21/19 1400      Response to Exercise   Blood Pressure (Admit) 124/64    Blood Pressure (Exercise) 122/76    Blood Pressure (Exit) 124/72    Heart Rate (Admit) 120 bpm    Heart Rate (Exercise) 129 bpm    Heart Rate (Exit) 117 bpm    Rating of  Perceived Exertion (Exercise) 14    Symptoms none    Duration Continue with 30 min of aerobic exercise without signs/symptoms of physical distress.    Intensity THRR unchanged      Progression   Progression Continue to progress workloads to maintain intensity without signs/symptoms of physical distress.    Average METs 3.24      Resistance Training   Training Prescription Yes    Weight 5 lb    Reps 10-15      Interval Training   Interval Training No      Treadmill   MPH 2.5    Grade 1.5    Minutes 15    METs 3.43      NuStep   Level 3    Minutes 15    METs 2.7      REL-XR   Level 7    Minutes 15  METs 3.6      Home Exercise Plan   Plans to continue exercise at Home (comment)   walking   Frequency Add 2 additional days to program exercise sessions.    Initial Home Exercises Provided 09/01/19           Functional Capacity:  6 Minute Walk    Row Name 08/02/19 0943         6 Minute Walk   Phase Initial     Distance 1265 feet     Walk Time 6 minutes     # of Rest Breaks 0     MPH 2.4     METS 3.68     RPE 17     VO2 Peak 12.85     Symptoms Yes (comment)     Comments joints achy     Resting HR 88 bpm     Resting BP 128/64     Resting Oxygen Saturation  99 %     Exercise Oxygen Saturation  during 6 min walk 100 %     Max Ex. HR 119 bpm     Max Ex. BP 148/60     2 Minute Post BP 122/64            Psychological, QOL, Others - Outcomes: PHQ 2/9: Depression screen [facility] 2/9 08/30/2019 08/19/2019 08/02/2019 05/25/2019 03/23/2019  Decreased Interest 2 0 3 0 0  Down, Depressed, Hopeless 2 2 1  0 0  PHQ - 2 Score 4 2 4  0 0  Altered sleeping 3 0 1 - -  Tired, decreased energy 3 0 2 - -  Change in appetite 0 0 1 - -  Feeling bad or failure about yourself  1 2 2  - -  Trouble concentrating 2 0 0 - -  Moving slowly or fidgety/restless 1 0 0 - -  Suicidal thoughts 0 0 0 - -  PHQ-9 Score 14 4 10  - -  Difficult doing work/chores Somewhat difficult Not  difficult at all Not difficult at all - -  Some recent data might be hidden    Quality of Life:  Quality of Life - 08/02/19 1006      Quality of Life   Select Quality of Life      Quality of Life Scores   Health/Function Pre 11.07 %    Socioeconomic Pre 21.56 %    Psych/Spiritual Pre 13.29 %    Family Pre 24 %    GLOBAL Pre 15.76 %           Personal Goals: Goals established at orientation with interventions provided to work toward goal.  Personal Goals and Risk Factors at Admission - 08/02/19 1007      Core Components/Risk Factors/Patient Goals on Admission    Weight Management Yes;Weight Loss    Intervention Weight Management: Develop a combined nutrition and exercise program designed to reach desired caloric intake, while maintaining appropriate intake of nutrient and fiber, sodium and fats, and appropriate energy expenditure required for the weight goal.;Weight Management: Provide education and appropriate resources to help participant work on and attain dietary goals.    Admit Weight 189 lb (85.7 kg)    Goal Weight: Short Term 184 lb (83.5 kg)    Goal Weight: Long Term 179 lb (81.2 kg)    Expected Outcomes Short Term: Continue to assess and modify interventions until short term weight is achieved;Long Term: Adherence to nutrition and physical activity/exercise program aimed toward attainment of established weight goal;Weight Loss:  Understanding of general recommendations for a balanced deficit meal plan, which promotes 1-2 lb weight loss per week and includes a negative energy balance of [phone] kcal/d;Understanding recommendations for meals to include 15-35% energy as protein, 25-35% energy from fat, 35-60% energy from carbohydrates, less than 200mg  of dietary cholesterol, 20-35 gm of total fiber daily;Understanding of distribution of calorie intake throughout the day with the consumption of 4-5 meals/snacks    Heart Failure Yes    Intervention Provide a combined exercise and  nutrition program that is supplemented with education, support and counseling about heart failure. Directed toward relieving symptoms such as shortness of breath, decreased exercise tolerance, and extremity edema.    Expected Outcomes Improve functional capacity of life;Short term: Attendance in program 2-3 days a week with increased exercise capacity. Reported lower sodium intake. Reported increased fruit and vegetable intake. Reports medication compliance.;Short term: Daily weights obtained and reported for increase. Utilizing diuretic protocols set by physician.;Long term: Adoption of self-care skills and reduction of barriers for early signs and symptoms recognition and intervention leading to self-care maintenance.    Hypertension Yes    Intervention Provide education on lifestyle modifcations including regular physical activity/exercise, weight management, moderate sodium restriction and increased consumption of fresh fruit, vegetables, and low fat dairy, alcohol moderation, and smoking cessation.;Monitor prescription use compliance.    Expected Outcomes Short Term: Continued assessment and intervention until BP is < 140/64mm HG in hypertensive participants. < 130/88mm HG in hypertensive participants with diabetes, heart failure or chronic kidney disease.;Long Term: Maintenance of blood pressure at goal levels.    Lipids Yes    Intervention Provide education and support for participant on nutrition & aerobic/resistive exercise along with prescribed medications to achieve LDL 70mg , HDL >40mg .    Expected Outcomes Short Term: Participant states understanding of desired cholesterol values and is compliant with medications prescribed. Participant is following exercise prescription and nutrition guidelines.;Long Term: Cholesterol controlled with medications as prescribed, with individualized exercise RX and with personalized nutrition plan. Value goals: LDL < 70mg , HDL > 40 mg.            Personal  Goals Discharge:  Goals and Risk Factor Review    Row Name 09/01/19 0828             Core Components/Risk Factors/Patient Goals Review   Personal Goals Review Weight Management/Obesity;Hypertension       Review Korbin is taking all meds as directed.  His weight stays steady.  We reviewed home exercise and the importance of sustained cardio exercise.       Expected Outcomes Short: attend consistently Long: maintain heart health              Exercise Goals and Review:  Exercise Goals    Row Name 08/02/19 1005             Exercise Goals   Increase Physical Activity Yes       Intervention Provide advice, education, support and counseling about physical activity/exercise needs.;Develop an individualized exercise prescription for aerobic and resistive training based on initial evaluation findings, risk stratification, comorbidities and participant's personal goals.       Expected Outcomes Short Term: Attend rehab on a regular basis to increase amount of physical activity.;Long Term: Add in home exercise to make exercise part of routine and to increase amount of physical activity.;Long Term: Exercising regularly at least 3-5 days a week.       Increase Strength and Stamina Yes  Intervention Provide advice, education, support and counseling about physical activity/exercise needs.;Develop an individualized exercise prescription for aerobic and resistive training based on initial evaluation findings, risk stratification, comorbidities and participant's personal goals.       Expected Outcomes Short Term: Increase workloads from initial exercise prescription for resistance, speed, and METs.;Short Term: Perform resistance training exercises routinely during rehab and add in resistance training at home;Long Term: Improve cardiorespiratory fitness, muscular endurance and strength as measured by increased METs and functional capacity (6MWT)       Able to understand and use rate of perceived  exertion (RPE) scale Yes       Intervention Provide education and explanation on how to use RPE scale       Expected Outcomes Short Term: Able to use RPE daily in rehab to express subjective intensity level;Long Term:  Able to use RPE to guide intensity level when exercising independently       Able to understand and use Dyspnea scale Yes       Intervention Provide education and explanation on how to use Dyspnea scale       Expected Outcomes Short Term: Able to use Dyspnea scale daily in rehab to express subjective sense of shortness of breath during exertion;Long Term: Able to use Dyspnea scale to guide intensity level when exercising independently       Knowledge and understanding of Target Heart Rate Range (THRR) Yes       Intervention Provide education and explanation of THRR including how the numbers were predicted and where they are located for reference       Expected Outcomes Short Term: Able to state/look up THRR;Short Term: Able to use daily as guideline for intensity in rehab;Long Term: Able to use THRR to govern intensity when exercising independently       Able to check pulse independently Yes       Intervention Provide education and demonstration on how to check pulse in carotid and radial arteries.;Review the importance of being able to check your own pulse for safety during independent exercise       Expected Outcomes Short Term: Able to explain why pulse checking is important during independent exercise;Long Term: Able to check pulse independently and accurately       Understanding of Exercise Prescription Yes       Intervention Provide education, explanation, and written materials on patient's individual exercise prescription       Expected Outcomes Short Term: Able to explain program exercise prescription;Long Term: Able to explain home exercise prescription to exercise independently              Exercise Goals Re-Evaluation:  Exercise Goals Re-Evaluation    Row Name 08/11/19  1056 08/22/19 1331 09/06/19 1513 09/21/19 1400       Exercise Goal Re-Evaluation   Exercise Goals Review Increase Physical Activity;Able to understand and use rate of perceived exertion (RPE) scale;Knowledge and understanding of Target Heart Rate Range (THRR);Understanding of Exercise Prescription;Increase Strength and Stamina;Able to check pulse independently Increase Physical Activity;Increase Strength and Stamina;Understanding of Exercise Prescription Increase Physical Activity;Increase Strength and Stamina;Able to understand and use rate of perceived exertion (RPE) scale Increase Physical Activity;Increase Strength and Stamina;Understanding of Exercise Prescription    Comments Reviewed RPE and dyspnea scales, THR and program prescription with pt today.  Pt voiced understanding and was given a copy of goals to take home. Yonis is off to a good start again in rehab.  He is already up to 4.1 METs  on the XR.  We will continue to monitor his progress. [place] attends consistently and is in correct THR range.Staff will monitor progress. Jathen is doing well in rehab.  He has been out this week as his leg is has flared up on him again.  He is up to level 7 on the XR and we will continue to monitor his progress.    Expected Outcomes Short: Use RPE daily to regulate intensity. Long: Follow program prescription in THR. Short: Begin to increase workloads  Long: Continue to follow program prescription. Short: attend consistently and add home exercise Long:  improve overall stamina Short: Get leg recovered  Long: Continue to improve stamina.           Nutrition & Weight - Outcomes:  Pre Biometrics - 08/02/19 1005      Pre Biometrics   Height 5' 9.9" (1.775 m)    Weight 189 lb (85.7 kg)    BMI (Calculated) 27.21    Single Leg Stand 1.21 seconds            Nutrition:  Nutrition Therapy & Goals - 08/30/19 0848      Nutrition Therapy   Diet Low Na, heart healthy, renal diet    Drug/Food Interactions  Coumadin/Vit K;Statins/Certain Fruits;Purine/Gout;Food/Disease    Protein (specify units) 70g    Fiber 30 grams    Whole Grain Foods 3 servings    Saturated Fats 12 max. grams    Fruits and Vegetables 5 servings/day    Sodium 1.5 grams      Personal Nutrition Goals   Nutrition Goal ST: review flavor handout LT: gain strength and get back to doing what he was doing before, reducing fluid retention.    Comments cut out red meat and pork. Usually eats baked chicken, more vegetables. Goes out to eat and doesn't like his food and is very bored. Pt will have egs with a small amount of cheese in the morning. Reviewed heart healthy and renal friendly eating. Pt made lots of healthy changes since he was last at rehab, but now he is very unsatisfied with his food, discussed ways to enhance flavor and variety while eating renal friendly. Gave pt handout to review.      Intervention Plan   Intervention Nutrition handout(s) given to patient.;Prescribe, educate and counsel regarding individualized specific dietary modifications aiming towards targeted core components such as weight, hypertension, lipid management, diabetes, heart failure and other comorbidities.    Expected Outcomes Short Term Goal: A plan has been developed with personal nutrition goals set during dietitian appointment.;Short Term Goal: Understand basic principles of dietary content, such as calories, fat, sodium, cholesterol and nutrients.;Long Term Goal: Adherence to prescribed nutrition plan.           Nutrition Discharge:  Nutrition Assessments - 08/02/19 1006      MEDFICTS Scores   Pre Score 56           Education Questionnaire Score:  Knowledge Questionnaire Score - 08/02/19 1006      Knowledge Questionnaire Score   Pre Score 23/26 Education Focus: NTG, angina, Exercise           Goals reviewed with patient; copy given to patient.

## 2019-10-11 NOTE — Telephone Encounter (Signed)
Jeremy [name] from [facility] calling for PT eval order.    OK to place in Epic as Order can be accessed.

## 2019-10-11 NOTE — Telephone Encounter (Signed)
Yes that is fine

## 2019-10-11 NOTE — Telephone Encounter (Signed)
Returned the call to [place] at [facility]. lmom with Dr. Tyrell Antonio verbal ok for the patient to have a PT eval.

## 2019-10-11 NOTE — Telephone Encounter (Signed)
Jeremy [name] called to let us know that he would like to discharge from the program at this time. His leg continues to be a problem and he is going to having HHPT.  He would like to return once able.

## 2019-10-11 NOTE — Progress Notes (Signed)
Cardiac Individual Treatment Plan  Patient Details  Name: Jeremy [name] MRN: [id number] Date of Birth: 15-Feb-1956 Referring Provider:     Cardiac Rehab from 08/02/2019 in [facility] Cardiac and Pulmonary Rehab  Referring Provider Kathlyn Sacramento MD      Initial Encounter Date:    Cardiac Rehab from 08/02/2019 in [facility] Cardiac and Pulmonary Rehab  Date 08/02/19      Visit Diagnosis: Status post coronary artery stent placement  S/P CABG x 3  Patient's Home Medications on Admission:  Current Outpatient Medications:  .  acetaminophen (TYLENOL) 500 MG tablet, Take 1,000 mg 2 (two) times daily as needed by mouth for moderate pain., Disp: , Rfl:  .  allopurinol (ZYLOPRIM) 300 MG tablet, Take 300 mg by mouth daily., Disp: , Rfl: 5 .  cholecalciferol (VITAMIN D3) 25 MCG (1000 UNIT) tablet, Take 1,000 Units by mouth daily., Disp: , Rfl:  .  cinacalcet (SENSIPAR) 30 MG tablet, Take 30 mg by mouth every other day. , Disp: , Rfl:  .  clopidogrel (PLAVIX) 75 MG tablet, Take 1 tablet (75 mg total) by mouth daily with breakfast., Disp: 90 tablet, Rfl: 3 .  Epoetin Alfa-epbx (RETACRIT IJ), Epoetin alfa - epbx (Retacrit), Disp: , Rfl:  .  ezetimibe (ZETIA) 10 MG tablet, Take 1 tablet (10 mg total) by mouth daily., Disp: 90 tablet, Rfl: 3 .  hydroxychloroquine (PLAQUENIL) 200 MG tablet, Take 200 mg by mouth 2 (two) times daily., Disp: , Rfl:  .  lidocaine-prilocaine (EMLA) cream, Apply 1 application topically as needed (for fistula). Monday, Wednesday, Friday for dialysis, Disp: , Rfl:  .  loratadine (CLARITIN) 10 MG tablet, Take 10 mg by mouth daily., Disp: , Rfl:  .  metoprolol tartrate (LOPRESSOR) 50 MG tablet, Take 1 tablet (50 mg total) by mouth 2 (two) times daily., Disp: 60 tablet, Rfl: 0 .  multivitamin (RENA-VIT) TABS tablet, Take 1 tablet by mouth daily., Disp: , Rfl:  .  nitroGLYCERIN (NITROSTAT) 0.4 MG SL tablet, Place 1 tablet (0.4 mg total) under the tongue every 5 (five) minutes as needed for  chest pain., Disp: 25 tablet, Rfl: 3 .  oxyCODONE-acetaminophen (PERCOCET/ROXICET) 5-325 MG tablet, Take 1-2 tablets by mouth every 6 (six) hours as needed for severe pain., Disp: 50 tablet, Rfl: 0 .  pantoprazole (PROTONIX) 40 MG tablet, Take 1 tablet (40 mg total) by mouth daily., Disp: 90 tablet, Rfl: 3 .  rosuvastatin (CRESTOR) 40 MG tablet, Take 1 tablet (40 mg total) by mouth daily at 6 PM., Disp: 90 tablet, Rfl: 3 .  sevelamer carbonate (RENVELA) 800 MG tablet, Take 4,000 mg by mouth 3 (three) times daily with meals. 5 tabs per meal , Disp: , Rfl:  .  warfarin (COUMADIN) 1 MG tablet, Take 1 mg by mouth daily., Disp: , Rfl:  .  zinc sulfate 220 (50 Zn) MG capsule, Take 220 mg by mouth daily., Disp: , Rfl:   Past Medical History: Past Medical History:  Diagnosis Date  . Abnormal stress test 03/19/2018  . 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 Aortic stenosis    a. 2014 s/p mech AVR ([facility])-->chronic coumadin; b. 08/2018 Echo: Mild AS/AI. Nl fxn/gradient.  . Arthritis   . Atrial fibrillation ([place])   . CAD (coronary artery disease)    a. 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 02/2018 MV ([facility]: large inf, infsept, inflat infarct w/ min rev, EF 30%; c.  06/2018 Cath: LM min irregs, LAD 60p, 95/30/46m, LCX min irregs, RCA 40p, 64m 90d, RPAV 90, RPDA 95ost; d. 09/2018 CABGx3 (LIMA->LAD, VG->D1, VG->PDA).  . Chronic anticoagulation 06/2012   a. Coumadin in the setting of mech AVR and afib.  .Marland KitchenCOVID-19 03/15/2019  . CPAP (continuous positive airway pressure) dependence   . 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 [place] [facility]  . [place] stage renal disease[place]    a. 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 1999 s/p Kidney transplant; c. 2014 Bilat nephrectomy (transplanted kidney resected) in the setting of renal cell carcinoma; d. PD until 11/2017-->HD.  .Marland KitchenHeart murmur   . HFrEF (heart failure with reduced ejection fraction) [place]    a. 07/2016 Echo: EF 55-60%; b. 02/2018 MV: EF 30%; c. 04/2018 Echo: EF 35%; d. 08/2018 Echo: EF 30-35%. Glob HK w/o rwma. Mildly reduced RV fxn. Mod to sev dil LA. Nl AoV fxn.  .Marland KitchenHx of colonoscopy    2009 by Dr. RLaural Golden Normal except for small submucosal lipoma. No evidence of polyps or colitis.  . Hyperlipidemia   . Hypertension   . Hypogonadism in male 07/25/2015  . Ischemic cardiomyopathy    a. 07/2016 Echo: EF 55-60%; b. 02/2018 MV: EF 30%; c. 04/2018 Echo: EF 35%; d. 08/2018 Echo: EF 30-35%.  . OSA (obstructive sleep apnea)    No CPAP  . Permanent atrial fibrillation (HCC)    a. CHA2DS2VASc = 3-->Coumadin (also mech AVR); b. 06/2018 Amio started 2/2 elevated rates.  . [place]   . [place] cell carcinoma [place] 08/04/2016    Tobacco Use: Social History   Tobacco Use  Smoking Status Never Smoker  Smokeless Tobacco Never Used    Labs: Recent Review Flowsheet Data    Labs for ITP Cardiac and Pulmonary Rehab Latest Ref Rng & Units 09/16/2018 09/16/2018 03/15/2019 04/26/2019 06/21/2019   Cholestrol 0 - 200 mg/dL - - - 179 136   LDLCALC 0 - 99 mg/dL - - - 111(H) 73   LDLDIRECT mg/dL - - - - -   HDL >40 mg/dL - - - 38(L) 33(L)   Trlycerides <150 mg/dL - - 179(H) 167(H) 150(H)   Hemoglobin A1c 4.8 - 5.6 % - - - - -   PHART 7.35 - 7.45 7.329(L) - - - -   PCO2ART 32 - 48 mmHg 48.1(H) - - - -   HCO3 20.0 - 28.0 mmol/L 25.2 - - - -   TCO2 22 - 32 mmol/L 27 24 - - -   ACIDBASEDEF 0.0 - 2.0 mmol/L 1.0 - - - -   O2SAT % 99.0 - - - -       Exercise Target Goals: Exercise Program Goal: Individual exercise prescription set using results from initial  6 min walk test and THRR while considering  patient's activity barriers and safety.   Exercise Prescription Goal: Initial exercise prescription builds to 30-45 minutes a day of aerobic activity, 2-3 days per week.  Home exercise guidelines will be given to patient during program as part of exercise prescription that the participant will acknowledge.   Education: Aerobic  Exercise & Resistance Training: - Gives group verbal and written instruction on the various components of exercise. Focuses on aerobic and resistive training programs and the benefits of this training and how to safely progress through these programs..   Education: Exercise & Equipment Safety: - Individual verbal instruction and demonstration of equipment use and safety with use of the equipment.   Cardiac Rehab from 07/28/2019 in [facility] Cardiac and Pulmonary Rehab  Date 07/28/19  Educator [facility]  Instruction Review Code 1- Verbalizes Understanding      Education: Exercise Physiology & General Exercise Guidelines: - Group verbal and written instruction with models to review the exercise physiology of the cardiovascular system and associated critical values. Provides general exercise guidelines with specific guidelines to those with heart or lung disease.    Education: Flexibility, Balance, Mind/Body Relaxation: Provides group verbal/written instruction on the benefits of flexibility and balance training, including mind/body exercise modes such as yoga, pilates and tai chi.  Demonstration and skill practice provided.   Cardiac Rehab from 02/24/2019 in [facility] Cardiac and Pulmonary Rehab  Date 02/24/19  Educator as  Instruction Review Code 1- Verbalizes Understanding      Activity Barriers & Risk Stratification:  Activity Barriers & Cardiac Risk Stratification - 08/02/19 0956      Activity Barriers & Cardiac Risk Stratification   Activity Barriers Joint Problems;Muscular Weakness;Deconditioning;Shortness of Breath;Balance  Concerns    Cardiac Risk Stratification High           6 Minute Walk:  6 Minute Walk    Row Name 08/02/19 0943         6 Minute Walk   Phase Initial     Distance 1265 feet     Walk Time 6 minutes     # of Rest Breaks 0     MPH 2.4     METS 3.68     RPE 17     VO2 Peak 12.85     Symptoms Yes (comment)     Comments joints achy     Resting HR 88 bpm     Resting BP 128/64     Resting Oxygen Saturation  99 %     Exercise Oxygen Saturation  during 6 min walk 100 %     Max Ex. HR 119 bpm     Max Ex. BP 148/60     2 Minute Post BP 122/64            Oxygen Initial Assessment:   Oxygen Re-Evaluation:   Oxygen Discharge (Final Oxygen Re-Evaluation):   Initial Exercise Prescription:  Initial Exercise Prescription - 08/02/19 0900      Date of Initial Exercise RX and Referring Provider   Date 08/02/19    Referring Provider Kathlyn Sacramento MD      Treadmill   MPH 2.3    Grade 1    Minutes 15    METs 3.08      NuStep   Level 3    SPM 80    Minutes 15    METs 3      REL-XR   Level 3    Speed 50    Minutes 15    METs 3      T5 Nustep   Level 3    SPM 80    Minutes 15    METs 3      Biostep-RELP   Level 3    SPM 50    Minutes 15    METs 3  Prescription Details   Frequency (times per week) 2    Duration Progress to 30 minutes of continuous aerobic without signs/symptoms of physical distress      Intensity   THRR 40-80% of Max Heartrate 116-144    Ratings of Perceived Exertion 11-13    Perceived Dyspnea 0-4      Progression   Progression Continue to progress workloads to maintain intensity without signs/symptoms of physical distress.      Resistance Training   Training Prescription Yes    Weight 3 lb    Reps 10-15           Perform Capillary Blood Glucose checks as needed.  Exercise Prescription Changes:  Exercise Prescription Changes    Row Name 08/02/19 0900 08/22/19 1300 09/06/19 1500 09/21/19 1400       Response to  Exercise   Blood Pressure (Admit) 128/64 118/60 112/64 124/64    Blood Pressure (Exercise) 148/60 130/62 120/60 122/76    Blood Pressure (Exit) 122/64 124/64 -- 124/72    Heart Rate (Admit) 88 bpm 122 bpm 126 bpm 120 bpm    Heart Rate (Exercise) 119 bpm 123 bpm 126 bpm 129 bpm    Heart Rate (Exit) 95 bpm 122 bpm 126 bpm 117 bpm    Oxygen Saturation (Admit) 99 % -- -- --    Oxygen Saturation (Exercise) 100 % -- -- --    Rating of Perceived Exertion (Exercise) _0 Symptoms joints achy none none none    Comments walk test results -- -- --    Duration -- Continue with 30 min of aerobic exercise without signs/symptoms of physical distress. Continue with 30 min of aerobic exercise without signs/symptoms of physical distress. Continue with 30 min of aerobic exercise without signs/symptoms of physical distress.    Intensity -- THRR unchanged THRR unchanged THRR unchanged      Progression   Progression -- Continue to progress workloads to maintain intensity without signs/symptoms of physical distress. Continue to progress workloads to maintain intensity without signs/symptoms of physical distress. Continue to progress workloads to maintain intensity without signs/symptoms of physical distress.    Average METs -- 3.59 3.5 3.24      Resistance Training   Training Prescription -- Yes Yes Yes    Weight -- 3 lb 3 lb 5 lb    Reps -- 10-15 10-15 10-15      Interval Training   Interval Training -- No No No      Treadmill   MPH -- 2.3 2.5 2.5    Grade -- 1 1.5 1.5    Minutes -- _1 METs -- 3.08 3.43 3.43      NuStep   Level -- -- -- 3    Minutes -- -- -- 15    METs -- -- -- 2.7      REL-XR   Level -- _2 Speed -- -- 50 --    Minutes -- _3 METs -- 4.1 3.6 3.6      Home Exercise Plan   Plans to continue exercise at -- -- -- Home (comment)  walking    Frequency -- -- -- Add 2 additional days to program exercise sessions.    Initial Home Exercises Provided --  -- -- 09/01/19           Exercise Comments:   Exercise Goals and Review:  Exercise Goals  [place] Name 08/02/19 1005             Exercise Goals   Increase Physical Activity Yes       Intervention Provide advice, education, support and counseling about physical activity/exercise needs.;Develop an individualized exercise prescription for aerobic and resistive training based on initial evaluation findings, risk stratification, comorbidities and participant's personal goals.       Expected Outcomes Short Term: Attend rehab on a regular basis to increase amount of physical activity.;Long Term: Add in home exercise to make exercise part of routine and to increase amount of physical activity.;Long Term: Exercising regularly at least 3-5 days a week.       Increase Strength and Stamina Yes       Intervention Provide advice, education, support and counseling about physical activity/exercise needs.;Develop an individualized exercise prescription for aerobic and resistive training based on initial evaluation findings, risk stratification, comorbidities and participant's personal goals.       Expected Outcomes Short Term: Increase workloads from initial exercise prescription for resistance, speed, and METs.;Short Term: Perform resistance training exercises routinely during rehab and add in resistance training at home;Long Term: Improve cardiorespiratory fitness, muscular endurance and strength as measured by increased METs and functional capacity (6MWT)       Able to understand and use rate of perceived exertion (RPE) scale Yes       Intervention Provide education and explanation on how to use RPE scale       Expected Outcomes Short Term: Able to use RPE daily in rehab to express subjective intensity level;Long Term:  Able to use RPE to guide intensity level when exercising independently       Able to understand and use Dyspnea scale Yes       Intervention Provide education and explanation on how to  use Dyspnea scale       Expected Outcomes Short Term: Able to use Dyspnea scale daily in rehab to express subjective sense of shortness of breath during exertion;Long Term: Able to use Dyspnea scale to guide intensity level when exercising independently       Knowledge and understanding of Target Heart Rate Range (THRR) Yes       Intervention Provide education and explanation of THRR including how the numbers were predicted and where they are located for reference       Expected Outcomes Short Term: Able to state/look up THRR;Short Term: Able to use daily as guideline for intensity in rehab;Long Term: Able to use THRR to govern intensity when exercising independently       Able to check pulse independently Yes       Intervention Provide education and demonstration on how to check pulse in carotid and radial arteries.;Review the importance of being able to check your own pulse for safety during independent exercise       Expected Outcomes Short Term: Able to explain why pulse checking is important during independent exercise;Long Term: Able to check pulse independently and accurately       Understanding of Exercise Prescription Yes       Intervention Provide education, explanation, and written materials on patient's individual exercise prescription       Expected Outcomes Short Term: Able to explain program exercise prescription;Long Term: Able to explain home exercise prescription to exercise independently              Exercise Goals Re-Evaluation :  Exercise Goals Re-Evaluation    Row Name 08/11/19 1056 08/22/19 1331 09/06/19 1513 09/21/19  1400       Exercise Goal Re-Evaluation   Exercise Goals Review Increase Physical Activity;Able to understand and use rate of perceived exertion (RPE) scale;Knowledge and understanding of Target Heart Rate Range (THRR);Understanding of Exercise Prescription;Increase Strength and Stamina;Able to check pulse independently Increase Physical Activity;Increase  Strength and Stamina;Understanding of Exercise Prescription Increase Physical Activity;Increase Strength and Stamina;Able to understand and use rate of perceived exertion (RPE) scale Increase Physical Activity;Increase Strength and Stamina;Understanding of Exercise Prescription    Comments Reviewed RPE and dyspnea scales, THR and program prescription with pt today.  Pt voiced understanding and was given a copy of goals to take home. Fin is off to a good start again in rehab.  He is already up to 4.1 METs on the XR.  We will continue to monitor his progress. Pal attends consistently and is in correct THR range.Staff will monitor progress. Albino is doing well in rehab.  He has been out this week as his leg is has flared up on him again.  He is up to level 7 on the XR and we will continue to monitor his progress.    Expected Outcomes Short: Use RPE daily to regulate intensity. Long: Follow program prescription in THR. Short: Begin to increase workloads  Long: Continue to follow program prescription. Short: attend consistently and add home exercise Long:  improve overall stamina Short: Get leg recovered  Long: Continue to improve stamina.           Discharge Exercise Prescription (Final Exercise Prescription Changes):  Exercise Prescription Changes - 09/21/19 1400      Response to Exercise   Blood Pressure (Admit) 124/64    Blood Pressure (Exercise) 122/76    Blood Pressure (Exit) 124/72    Heart Rate (Admit) 120 bpm    Heart Rate (Exercise) 129 bpm    Heart Rate (Exit) 117 bpm    Rating of Perceived Exertion (Exercise) 14    Symptoms none    Duration Continue with 30 min of aerobic exercise without signs/symptoms of physical distress.    Intensity THRR unchanged      Progression   Progression Continue to progress workloads to maintain intensity without signs/symptoms of physical distress.    Average METs 3.24      Resistance Training   Training Prescription Yes    Weight 5 lb    Reps 10-15       Interval Training   Interval Training No      Treadmill   MPH 2.5    Grade 1.5    Minutes 15    METs 3.43      NuStep   Level 3    Minutes 15    METs 2.7      REL-XR   Level 7    Minutes 15    METs 3.6      Home Exercise Plan   Plans to continue exercise at Home (comment)   walking   Frequency Add 2 additional days to program exercise sessions.    Initial Home Exercises Provided 09/01/19           Nutrition:  Target Goals: Understanding of nutrition guidelines, daily intake of sodium <1597m, cholesterol <2014m calories 30% from fat and 7% or less from saturated fats, daily to have 5 or more servings of fruits and vegetables.  Education: Controlling Sodium/Reading Food Labels -Group verbal and written material supporting the discussion of sodium use in heart healthy nutrition. Review and explanation with models, verbal and written  materials for utilization of the food label.   Education: General Nutrition Guidelines/Fats and Fiber: -Group instruction provided by verbal, written material, models and posters to present the general guidelines for heart healthy nutrition. Gives an explanation and review of dietary fats and fiber.   Cardiac Rehab from 02/24/2019 in [facility] Cardiac and Pulmonary Rehab  Date 02/24/19  Educator mc  Instruction Review Code 1- Verbalizes Understanding      Biometrics:  Pre Biometrics - 08/02/19 1005      Pre Biometrics   Height 5' 9.9" (1.775 m)    Weight 189 lb (85.7 kg)    BMI (Calculated) 27.21    Single Leg Stand 1.21 seconds            Nutrition Therapy Plan and Nutrition Goals:  Nutrition Therapy & Goals - 08/30/19 0848      Nutrition Therapy   Diet Low Na, heart healthy, renal diet    Drug/Food Interactions Coumadin/Vit K;Statins/Certain Fruits;Purine/Gout;Food/Disease    Protein (specify units) 70g    Fiber 30 grams    Whole Grain Foods 3 servings    Saturated Fats 12 max. grams    Fruits and Vegetables 5  servings/day    Sodium 1.5 grams      Personal Nutrition Goals   Nutrition Goal ST: review flavor handout LT: gain strength and get back to doing what he was doing before, reducing fluid retention.    Comments cut out red meat and pork. Usually eats baked chicken, more vegetables. Goes out to eat and doesn't like his food and is very bored. Pt will have egs with a small amount of cheese in the morning. Reviewed heart healthy and renal friendly eating. Pt made lots of healthy changes since he was last at rehab, but now he is very unsatisfied with his food, discussed ways to enhance flavor and variety while eating renal friendly. Gave pt handout to review.      Intervention Plan   Intervention Nutrition handout(s) given to patient.;Prescribe, educate and counsel regarding individualized specific dietary modifications aiming towards targeted core components such as weight, hypertension, lipid management, diabetes, heart failure and other comorbidities.    Expected Outcomes Short Term Goal: A plan has been developed with personal nutrition goals set during dietitian appointment.;Short Term Goal: Understand basic principles of dietary content, such as calories, fat, sodium, cholesterol and nutrients.;Long Term Goal: Adherence to prescribed nutrition plan.           Nutrition Assessments:  Nutrition Assessments - 08/02/19 1006      MEDFICTS Scores   Pre Score 56           MEDIFICTS Score Key:          ?70 Need to make dietary changes          40-70 Heart Healthy Diet         ? 40 Therapeutic Level Cholesterol Diet  Nutrition Goals Re-Evaluation:   Nutrition Goals Discharge (Final Nutrition Goals Re-Evaluation):   Psychosocial: Target Goals: Acknowledge presence or absence of significant depression and/or stress, maximize coping skills, provide positive support system. Participant is able to verbalize types and ability to use techniques and skills needed for reducing stress and  depression.   Education: Depression - Provides group verbal and written instruction on the correlation between heart/lung disease and depressed mood, treatment options, and the stigmas associated with seeking treatment.   Education: Sleep Hygiene -Provides group verbal and written instruction about how sleep can affect your health.  Define  sleep hygiene, discuss sleep cycles and impact of sleep habits. Review good sleep hygiene tips.     Education: Stress and Anxiety: - Provides group verbal and written instruction about the health risks of elevated stress and causes of high stress.  Discuss the correlation between heart/lung disease and anxiety and treatment options. Review healthy ways to manage with stress and anxiety.    Initial Review & Psychosocial Screening:  Initial Psych Review & Screening - 07/28/19 0940      Initial Review   Current issues with None Identified      Family Dynamics   Good Support System? Yes    Comments He can look to his girlfriend and daughter for support.      Barriers   Psychosocial barriers to participate in program The patient should benefit from training in stress management and relaxation.      Screening Interventions   Interventions Encouraged to exercise;To provide support and resources with identified psychosocial needs;Provide feedback about the scores to participant    Expected Outcomes Short Term goal: Utilizing psychosocial counselor, staff and physician to assist with identification of specific Stressors or current issues interfering with healing process. Setting desired goal for each stressor or current issue identified.;Long Term Goal: Stressors or current issues are controlled or eliminated.;Short Term goal: Identification and review with participant of any Quality of Life or Depression concerns found by scoring the questionnaire.;Long Term goal: The participant improves quality of Life and PHQ9 Scores as seen by post scores and/or  verbalization of changes           Quality of Life Scores:   Quality of Life - 08/02/19 1006      Quality of Life   Select Quality of Life      Quality of Life Scores   Health/Function Pre 11.07 %    Socioeconomic Pre 21.56 %    Psych/Spiritual Pre 13.29 %    Family Pre 24 %    GLOBAL Pre 15.76 %          Scores of 19 and below usually indicate a poorer quality of life in these areas.  A difference of  2-3 points is a clinically meaningful difference.  A difference of 2-3 points in the total score of the Quality of Life Index has been associated with significant improvement in overall quality of life, self-image, physical symptoms, and general health in studies assessing change in quality of life.  PHQ-9: Recent Review Flowsheet Data    Depression screen [facility] [facility]9 08/30/2019 08/19/2019 08/02/2019 05/25/2019 03/23/2019   Decreased Interest 2 0 3 0 0   Down, Depressed, Hopeless _0 0 0   PHQ - 2 Score _1 0 0   Altered sleeping 3 0 1 - -   Tired, decreased energy 3 0 2 - -   Change in appetite 0 0 1 - -   Feeling bad or failure about yourself  _2 - -   Trouble concentrating 2 0 0 - -   Moving slowly or fidgety/restless 1 0 0 - -   Suicidal thoughts 0 0 0 - -   PHQ-9 Score _3 - -   Difficult doing work/chores Somewhat difficult Not difficult at all Not difficult at all - -     Interpretation of Total Score  Total Score Depression Severity:  1-4 = Minimal depression, 5-9 = Mild depression, 10-14 = Moderate depression, 15-19 = Moderately severe depression, 20-27 = Severe depression  Psychosocial Evaluation and Intervention:  Psychosocial Evaluation - 07/28/19 0941      Psychosocial Evaluation & Interventions   Interventions Encouraged to exercise with the program and follow exercise prescription;Stress management education    Comments Darvis has a positive outlook on his health and is ready to start rehab.    Expected Outcomes Short: Attend [facility] stress  management education to decrease stress. Long: Maintain exercise Post [facility] to keep stress at a minimum.    Continue Psychosocial Services  Follow up required by staff           Psychosocial Re-Evaluation:  Psychosocial Re-Evaluation    Winston Name 08/30/19 0813 09/01/19 0830           Psychosocial Re-Evaluation   Current issues with Current Depression;Current Psychotropic Meds;Current Stress Concerns --      Comments Reviewed patient health questionnaire (PHQ-9) with patient for follow up. Previously, patients score indicated signs/symptoms of depression.  Reviewed to see if patient is improving symptom wise while in program.  Score declined and patient states that it is because he feels like he is moving backwards with his health. He states that he has no other depression except for his health. He spoke to the doctor and has started taking Prozac to help his mood. Informed him that he is taking all the right steps to keep up with his help and to let staff know what we can do for him. Jadrien did speak with his Dr about Prozac and he cant take it due to interaction with Plavix.  He will follow up about other meds.      Expected Outcomes Short: Continue to work toward an improvement in [place] scores by attending [facility] regularly. Long: Continue to improve stress and depression coping skills by talking with staff and attending [facility] regularly and work toward a positive mental state. Short:  follow up with Dr , try mindfulness Long:  work towards positive outlook      Interventions Encouraged to attend Cardiac Rehabilitation for the exercise --      Continue Psychosocial Services  Follow up required by staff --             Psychosocial Discharge (Final Psychosocial Re-Evaluation):  Psychosocial Re-Evaluation - 09/01/19 0830      Psychosocial Re-Evaluation   Comments Rishav did speak with his Dr about Prozac and he cant take it due to interaction with Plavix.  He will follow up about  other meds.    Expected Outcomes Short:  follow up with Dr , try mindfulness Long:  work towards positive outlook           Vocational Rehabilitation: Provide vocational rehab assistance to qualifying candidates.   Vocational Rehab Evaluation & Intervention:   Education: Education Goals: Education classes will be provided on a variety of topics geared toward better understanding of heart health and risk factor modification. Participant will state understanding/return demonstration of topics presented as noted by education test scores.  Learning Barriers/Preferences:  Learning Barriers/Preferences - 07/28/19 0941      Learning Barriers/Preferences   Learning Barriers None    Learning Preferences None           General Cardiac Education Topics:  AED/CPR: - Group verbal and written instruction with the use of models to demonstrate the basic use of the AED with the basic ABC's of resuscitation.   Anatomy & Physiology of the Heart: - Group verbal and written instruction and models provide basic cardiac anatomy and physiology, with  the coronary electrical and arterial systems. Review of Valvular disease and Heart Failure   Cardiac Procedures: - Group verbal and written instruction to review commonly prescribed medications for heart disease. Reviews the medication, class of the drug, and side effects. Includes the steps to properly store meds and maintain the prescription regimen. (beta blockers and nitrates)   Cardiac Medications I: - Group verbal and written instruction to review commonly prescribed medications for heart disease. Reviews the medication, class of the drug, and side effects. Includes the steps to properly store meds and maintain the prescription regimen.   Cardiac Medications II: -Group verbal and written instruction to review commonly prescribed medications for heart disease. Reviews the medication, class of the drug, and side effects. (all other drug  classes)    Go Sex-Intimacy & Heart Disease, Get SMART - Goal Setting: - Group verbal and written instruction through game format to discuss heart disease and the return to sexual intimacy. Provides group verbal and written material to discuss and apply goal setting through the application of the S.M.A.R.T. Method.   Other Matters of the Heart: - Provides group verbal, written materials and models to describe Stable Angina and Peripheral Artery. Includes description of the disease process and treatment options available to the cardiac patient.   Infection Prevention: - Provides verbal and written material to individual with discussion of infection control including proper hand washing and proper equipment cleaning during exercise session.   Cardiac Rehab from 07/28/2019 in [facility] Cardiac and Pulmonary Rehab  Date 07/28/19  Educator [facility]  Instruction Review Code 1- Verbalizes Understanding      Falls Prevention: - Provides verbal and written material to individual with discussion of falls prevention and safety.   Cardiac Rehab from 07/28/2019 in [facility] Cardiac and Pulmonary Rehab  Date 07/28/19  Educator [facility]  Instruction Review Code 1- Verbalizes Understanding      Other: -Provides group and verbal instruction on various topics (see comments)   Knowledge Questionnaire Score:  Knowledge Questionnaire Score - 08/02/19 1006      Knowledge Questionnaire Score   Pre Score 23/26 Education Focus: NTG, angina, Exercise           Core Components/Risk Factors/Patient Goals at Admission:  Personal Goals and Risk Factors at Admission - 08/02/19 1007      Core Components/Risk Factors/Patient Goals on Admission    Weight Management Yes;Weight Loss    Intervention Weight Management: Develop a combined nutrition and exercise program designed to reach desired caloric intake, while maintaining appropriate intake of nutrient and fiber, sodium and fats, and appropriate energy expenditure required  for the weight goal.;Weight Management: Provide education and appropriate resources to help participant work on and attain dietary goals.    Admit Weight 189 lb (85.7 kg)    Goal Weight: Short Term 184 lb (83.5 kg)    Goal Weight: Long Term 179 lb (81.2 kg)    Expected Outcomes Short Term: Continue to assess and modify interventions until short term weight is achieved;Long Term: Adherence to nutrition and physical activity/exercise program aimed toward attainment of established weight goal;Weight Loss: Understanding of general recommendations for a balanced deficit meal plan, which promotes 1-2 lb weight loss per week and includes a negative energy balance of [phone] kcal/d;Understanding recommendations for meals to include 15-35% energy as protein, 25-35% energy from fat, 35-60% energy from carbohydrates, less than 279m of dietary cholesterol, 20-35 gm of total fiber daily;Understanding of distribution of calorie intake throughout the day with the consumption of 4-5 meals/snacks  Heart Failure Yes    Intervention Provide a combined exercise and nutrition program that is supplemented with education, support and counseling about heart failure. Directed toward relieving symptoms such as shortness of breath, decreased exercise tolerance, and extremity edema.    Expected Outcomes Improve functional capacity of life;Short term: Attendance in program 2-3 days a week with increased exercise capacity. Reported lower sodium intake. Reported increased fruit and vegetable intake. Reports medication compliance.;Short term: Daily weights obtained and reported for increase. Utilizing diuretic protocols set by physician.;Long term: Adoption of self-care skills and reduction of barriers for early signs and symptoms recognition and intervention leading to self-care maintenance.    Hypertension Yes    Intervention Provide education on lifestyle modifcations including regular physical activity/exercise, weight management,  moderate sodium restriction and increased consumption of fresh fruit, vegetables, and low fat dairy, alcohol moderation, and smoking cessation.;Monitor prescription use compliance.    Expected Outcomes Short Term: Continued assessment and intervention until BP is < 140/67m HG in hypertensive participants. < 130/860mHG in hypertensive participants with diabetes, heart failure or chronic kidney disease.;Long Term: Maintenance of blood pressure at goal levels.    Lipids Yes    Intervention Provide education and support for participant on nutrition & aerobic/resistive exercise along with prescribed medications to achieve LDL <7072mHDL >19m60m  Expected Outcomes Short Term: Participant states understanding of desired cholesterol values and is compliant with medications prescribed. Participant is following exercise prescription and nutrition guidelines.;Long Term: Cholesterol controlled with medications as prescribed, with individualized exercise RX and with personalized nutrition plan. Value goals: LDL < 70mg76mL > 40 mg.           Education:Diabetes - Individual verbal and written instruction to review signs/symptoms of diabetes, desired ranges of glucose level fasting, after meals and with exercise. Acknowledge that pre and post exercise glucose checks will be done for 3 sessions at entry of program.   Education: Know Your Numbers and Risk Factors: -Group verbal and written instruction about important numbers in your health.  Discussion of what are risk factors and how they play a role in the disease process.  Review of Cholesterol, Blood Pressure, Diabetes, and BMI and the role they play in your overall health.   Core Components/Risk Factors/Patient Goals Review:   Goals and Risk Factor Review    Row Name 09/01/19 0828             Core Components/Risk Factors/Patient Goals Review   Personal Goals Review Weight Management/Obesity;Hypertension       Review [place] [facility] all meds as  directed.  His weight stays steady.  We reviewed home exercise and the importance of sustained cardio exercise.       Expected Outcomes Short: attend consistently Long: maintain heart health              Core Components/Risk Factors/Patient Goals at Discharge (Final Review):   Goals and Risk Factor Review - 09/01/19 0828      Core Components/Risk Factors/Patient Goals Review   Personal Goals Review Weight Management/Obesity;Hypertension    Review [place] [facility] all meds as directed.  His weight stays steady.  We reviewed home exercise and the importance of sustained cardio exercise.    Expected Outcomes Short: attend consistently Long: maintain heart health           ITP Comments:  ITP Comments    Row Name 07/28/19 0944 08/02/19 0941 08/11/19 1055 08/24/19 0531 09/21/19 0542   ITP Comments Virtual Orientation  performed. Patient informed when to come in for RD and EP orientation. Diagnosis can be found in [facility] 08/02/2019. Completed 6MWT and gym orientation.  Initial ITP created and sent for review to Dr. Emily Filbert, Medical Director. First full day of exercise!  Patient was oriented to gym and equipment including functions, settings, policies, and procedures.  Patient's individual exercise prescription and treatment plan were reviewed.  All starting workloads were established based on the results of the 6 minute walk test done at initial orientation visit.  The plan for exercise progression was also introduced and progression will be customized based on patient's performance and goals 30 Day review completed. ITP review done, changes made as directed,and approval shown by signature of  Scientist, research (life sciences).  New to program 30 Day review completed. Medical Director ITP review done, changes made as directed, and signed approval by Medical Director.   [place] Name 09/21/19 1402 09/23/19 0938 10/03/19 1456 10/11/19 1210     ITP Comments Heaton called out with leg pain, waiting to hear back from the  [place]. 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  They will doing a clot removal next week and started him on prednisone for now to help with pain.  We took out visits for the next two weeks with hopes that he can return on 7/6. Plan is still for [place] to return 7/6. Henri called to let us know that he would like to discharge from the program at this time. His leg continues to be a problem and he is going to having HHPT.  He would like to return once able.           Comments: Discharge ITP

## 2019-10-11 NOTE — Telephone Encounter (Signed)
Message forwarded to Dr. [name] for approval.

## 2019-10-12 ENCOUNTER — Ambulatory Visit (INDEPENDENT_AMBULATORY_CARE_PROVIDER_SITE_OTHER): Payer: Medicare Other | Admitting: Family Medicine

## 2019-10-12 ENCOUNTER — Other Ambulatory Visit: Payer: Self-pay

## 2019-10-12 ENCOUNTER — Other Ambulatory Visit (INDEPENDENT_AMBULATORY_CARE_PROVIDER_SITE_OTHER): Payer: Self-pay | Admitting: Nurse Practitioner

## 2019-10-12 ENCOUNTER — Inpatient Hospital Stay: Payer: Medicare Other | Admitting: Family Medicine

## 2019-10-12 ENCOUNTER — Encounter: Payer: Self-pay | Admitting: Family Medicine

## 2019-10-12 VITALS — BP 134/64 | Ht 70.0 in | Wt 187.4 lb

## 2019-10-12 DIAGNOSIS — S7012XA Contusion of left thigh, initial encounter: Secondary | ICD-10-CM

## 2019-10-12 DIAGNOSIS — I724 Aneurysm of artery of lower extremity: Secondary | ICD-10-CM

## 2019-10-12 DIAGNOSIS — I248 Other forms of acute ischemic heart disease: Secondary | ICD-10-CM

## 2019-10-12 DIAGNOSIS — I7025 Atherosclerosis of native arteries of other extremities with ulceration: Secondary | ICD-10-CM

## 2019-10-12 DIAGNOSIS — Z95820 Peripheral vascular angioplasty status with implants and grafts: Secondary | ICD-10-CM

## 2019-10-12 NOTE — Progress Notes (Signed)
Virtual Visit via telephone Note  This visit type was conducted due to national recommendations for restrictions regarding the COVID-19 pandemic (e.g. social distancing).  This format is felt to be most appropriate for this patient at this time.  All issues noted in this document were discussed and addressed.  No physical exam was performed (except for noted visual exam findings with Video Visits).   I connected with Jeremy [name] today at  2:45 PM EDT by telephone and verified that I am speaking with the correct person using two identifiers. Location patient: home Location provider: home office Persons participating in the virtual visit: patient, provider  I discussed the limitations, risks, security and privacy concerns of performing an evaluation and management service by telephone and the availability of in person appointments. I also discussed with the patient that there may be a patient responsible charge related to this service. The patient expressed understanding and agreed to proceed.  Interactive audio and video telecommunications were attempted between this provider and patient, however failed, due to patient having technical difficulties OR patient did not have access to video capability.  We continued and completed visit with audio only.   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  This was done on an outpatient basis though he subsequently developed significant pain in his left lower extremity with swelling.  He was found to have a hematoma and was returned to the angiography suite and underwent another procedure with stent placement and extension of stents bilaterally into the distal common femoral artery.  He tolerated that procedure well.  His hemoglobin did drop while he was hospitalized and he did receive a blood transfusion during dialysis while he was  hospitalized.  Patient notes at this time he does still have the aneurysm and has a difficult time walking related to this and some pain.  He has had some leg numbness that has been persistent since he was in the hospital.  The patient additionally had a NSTEMI while in the hospital due to poor perfusion in the setting of his anemia.  He continues to have some pain in his left lower extremity and has been taking Tylenol for that.  He tried oxycodone in the hospital though that made him feel weird.  He has had no bleeding issues.  His breathing has been fine.  No palpitations.  He has an appointment with cardiology later this month.  He has follow-up tomorrow with vascular surgery though he would like to transfer his vascular care to someone in [place].  Notes his BP has been 120s over 70s.   ROS: See pertinent positives and negatives per HPI.  Past Medical History:  Diagnosis Date  . Abnormal stress test 03/19/2018  . 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 Aortic stenosis    a. 2014 s/p mech AVR ([facility])-->chronic coumadin; b. 08/2018 Echo: Mild AS/AI. Nl fxn/gradient.  . Arthritis   . Atrial fibrillation ([place])   . CAD (coronary artery disease)    a. 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 02/2018 MV [facility]): large inf, infsept, inflat infarct w/ min rev, EF 30%; c. 06/2018 Cath: LM min irregs, LAD 60p, 95/30/46m, LCX min irregs, RCA 40p, 64m, 90d, RPAV 90, RPDA 95ost; d. 09/2018 CABGx3 (LIMA->LAD, VG->D1, VG->PDA).  . Chronic anticoagulation 06/2012   a. Coumadin in the setting of  mech AVR and afib.  Marland Kitchen COVID-19 03/15/2019  . CPAP (continuous positive airway pressure) dependence   . Dialysis patient [facility]) 04/07/2006   Patient started HD around 1996,  had 1st transplant LLQ around 1998 until 2007 when they found renal cell cancer in transplant and both native kidneys, all were removed > went back on HD until 2nd transplant RLQ in 2014  which lasted 3 yrs; it was embolized for suspected acute rejection. Is currently on HD MWF in [place] w/ [facility] nephrology.  . ESRD (end stage renal disease) ([place])    a. CKD 2/2 to IgA nephropathy (dx age 58); b. 1999 s/p Kidney transplant; c. 2014 Bilat nephrectomy (transplanted kidney resected) in the setting of renal cell carcinoma; d. PD until 11/2017-->HD.  Marland Kitchen Heart murmur   . HFrEF (heart failure with reduced ejection fraction) ([place])    a. 07/2016 Echo: EF 55-60%; b. 02/2018 MV: EF 30%; c. 04/2018 Echo: EF 35%; d. 08/2018 Echo: EF 30-35%. Glob HK w/o rwma. Mildly reduced RV fxn. Mod to sev dil LA. Nl AoV fxn.  Marland Kitchen Hx of colonoscopy    2009 by Dr. Laural Golden. Normal except for small submucosal lipoma. No evidence of polyps or colitis.  . Hyperlipidemia   . Hypertension   . Hypogonadism in male 07/25/2015  . Ischemic cardiomyopathy    a. 07/2016 Echo: EF 55-60%; b. 02/2018 MV: EF 30%; c. 04/2018 Echo: EF 35%; d. 08/2018 Echo: EF 30-35%.  . OSA (obstructive sleep apnea)    No CPAP  . Permanent atrial fibrillation ([facility])    a. CHA2DS2VASc = 3-->Coumadin (also mech AVR); b. 06/2018 Amio started 2/2 elevated rates.  . Renal cell cancer ([place])   . Renal cell carcinoma ([place]) 08/04/2016    Past Surgical History:  Procedure Laterality Date  . AORTIC VALVE REPLACEMENT  3 /11 /2014   At [place]    . CAPD INSERTION N/A 02/19/2017   Procedure: LAPAROSCOPIC INSERTION CONTINUOUS AMBULATORY PERITONEAL DIALYSIS  (CAPD) CATHETER;  Surgeon: Algernon Huxley, MD;  Location: [facility] OR[facility];  Service: General;  Laterality: N/A;  . CORONARY ANGIOPLASTY WITH STENT PLACEMENT    . CORONARY ARTERY BYPASS GRAFT N/A 09/16/2018   Procedure: CORONARY ARTERY BYPASS GRAFT times three      with left internal mammary artery and endoharvest of right leg greater saphenous vein;  Surgeon: Melrose Nakayama, MD;  Location: [place];  Service: Open Heart Surgery;  Laterality: N/A;  . CORONARY STENT  INTERVENTION N/A 07/15/2019   Procedure: CORONARY STENT INTERVENTION;  Surgeon: Wellington Hampshire, MD;  Location: [facility];  Service: Cardiovascular;  Laterality: N/A;  . DG AV DIALYSIS  SHUNT ACCESS EXIST*L* OR     left arm  . EPIGASTRIC HERNIA REPAIR N/A 02/19/2017   Procedure: HERNIA REPAIR EPIGASTRIC ADULT;  Surgeon: Robert Bellow, MD;  Location: [facility];  Service: General;  Laterality: N/A;  . EYE SURGERY     bilateral cataract  . HERNIA REPAIR     UMBILICAL HERNIA  . HERNIA REPAIR  02/19/2017  . INNER EAR SURGERY     20 years ago  . KIDNEY TRANSPLANT    . LEFT HEART CATH AND CORS/GRAFTS ANGIOGRAPHY N/A 07/15/2019   Procedure: LEFT HEART CATH AND CORS/GRAFTS ANGIOGRAPHY;  Surgeon: Wellington Hampshire, MD;  Location: [place] [facility];  Service: Cardiovascular;  Laterality: N/A;  . LOWER EXTREMITY ANGIOGRAPHY Right 08/25/2019   Procedure: LOWER EXTREMITY ANGIOGRAPHY;  Surgeon: Algernon Huxley, MD;  Location: [place] [facility];  Service: Cardiovascular;  Laterality: Right;  . LOWER EXTREMITY ANGIOGRAPHY Left 09/29/2019   Procedure: Lower Extremity Angiography;  Surgeon: Algernon Huxley, MD;  Location: [place] [facility];  Service: Cardiovascular;  Laterality: Left;  . Peritoneal Dialysis access placement  02/19/2017  . PSEUDOANERYSM COMPRESSION N/A 09/29/2019   Procedure: PSEUDOANERYSM COMPRESSION;  Surgeon: Algernon Huxley, MD;  Location: [place] [facility];  Service: Cardiovascular;  Laterality: N/A;  . REMOVAL OF A DIALYSIS CATHETER N/A 11/23/2017   Procedure: REMOVAL OF A PERITONEAL DIALYSIS CATHETER;  Surgeon: Algernon Huxley, MD;  Location: [facility];  Service: Vascular;  Laterality: N/A;  . RIGHT/LEFT HEART CATH AND CORONARY ANGIOGRAPHY Bilateral 06/07/2018   Procedure: RIGHT/LEFT HEART CATH AND CORONARY ANGIOGRAPHY;  Surgeon: Wellington Hampshire, MD;  Location: [facility];  Service: Cardiovascular;  Laterality: Bilateral;  . TEE WITHOUT CARDIOVERSION N/A 07/21/2016    Procedure: TRANSESOPHAGEAL ECHOCARDIOGRAM (TEE);  Surgeon: Wellington Hampshire, MD;  Location: [facility];  Service: Cardiovascular;  Laterality: N/A;  . TEE WITHOUT CARDIOVERSION N/A 09/16/2018   Procedure: TRANSESOPHAGEAL ECHOCARDIOGRAM (TEE);  Surgeon: Melrose Nakayama, MD;  Location: [facility];  Service: Open Heart Surgery;  Laterality: N/A;    Family History  Problem Relation Age of Onset  . Cancer Mother        pancreatic  . Heart disease Father   . Diabetes Father     SOCIAL HX: Non-smoker   Current Outpatient Medications:  .  acetaminophen (TYLENOL) 500 MG tablet, Take 1,000 mg 2 (two) times daily as needed by mouth for moderate pain., Disp: , Rfl:  .  allopurinol (ZYLOPRIM) 300 MG tablet, Take 300 mg by mouth daily., Disp: , Rfl: 5 .  cholecalciferol (VITAMIN D3) 25 MCG (1000 UNIT) tablet, Take 1,000 Units by mouth daily., Disp: , Rfl:  .  cinacalcet (SENSIPAR) 30 MG tablet, Take 30 mg by mouth every other day. , Disp: , Rfl:  .  clopidogrel (PLAVIX) 75 MG tablet, Take 1 tablet (75 mg total) by mouth daily with breakfast., Disp: 90 tablet, Rfl: 3 .  Epoetin Alfa-epbx (RETACRIT IJ), Epoetin alfa - epbx (Retacrit), Disp: , Rfl:  .  hydroxychloroquine (PLAQUENIL) 200 MG tablet, Take 200 mg by mouth 2 (two) times daily., Disp: , Rfl:  .  lidocaine-prilocaine (EMLA) cream, Apply 1 application topically as needed (for fistula). Monday, Wednesday, Friday for dialysis, Disp: , Rfl:  .  loratadine (CLARITIN) 10 MG tablet, Take 10 mg by mouth daily., Disp: , Rfl:  .  metoprolol tartrate (LOPRESSOR) 50 MG tablet, Take 1 tablet (50 mg total) by mouth 2 (two) times daily., Disp: 60 tablet, Rfl: 0 .  multivitamin (RENA-VIT) TABS tablet, Take 1 tablet by mouth daily., Disp: , Rfl:  .  oxyCODONE-acetaminophen (PERCOCET/ROXICET) 5-325 MG tablet, Take 1-2 tablets by mouth every 6 (six) hours as needed for severe pain., Disp: 50 tablet, Rfl: 0 .  pantoprazole (PROTONIX) 40 MG tablet, Take 1 tablet (40  mg total) by mouth daily., Disp: 90 tablet, Rfl: 3 .  rosuvastatin (CRESTOR) 40 MG tablet, Take 1 tablet (40 mg total) by mouth daily at 6 PM., Disp: 90 tablet, Rfl: 3 .  sevelamer carbonate (RENVELA) 800 MG tablet, Take 4,000 mg by mouth 3 (three) times daily with meals. 5 tabs per meal , Disp: , Rfl:  .  warfarin (COUMADIN) 1 MG tablet, Take 1 mg by mouth daily., Disp: , Rfl:  .  zinc sulfate 220 (50 Zn) MG capsule, Take 220 mg by mouth daily., Disp: , Rfl:  .  ezetimibe (ZETIA) 10 MG tablet, Take 1 tablet (10 mg total) by mouth daily., Disp: 90 tablet, Rfl: 3 .  nitroGLYCERIN (NITROSTAT) 0.4 MG SL tablet, Place 1 tablet (0.4 mg total) under the tongue every 5 (five) minutes as needed for chest pain., Disp: 25 tablet, Rfl: 3  EXAM: This was a telephone visit and thus no physical exam was completed.  ASSESSMENT AND PLAN:  Discussed the following assessment and plan:  Demand ischemia ([place]) Related to anemia.  Has done well since discharge.  Discussed rechecking his CBC to determine if he is still significantly anemic though he notes this was checked through dialysis and he would like to wait on those results if possible.  He will let us know what his results were.  Hematoma of thigh, left, initial encounter Does continue to have some symptoms.  He has follow-up with vascular surgery tomorrow to have this evaluated with an ultrasound.  Discussed given that his symptoms are relatively stable and not worsening he should follow-up with them tomorrow for evaluation to determine the next step in management.  I also discussed that I would refer him to a vascular surgeon in [place] for his future care.  The patient will let me know who he would like to see there.  Advised to seek medical attention for any worsening symptoms.   Orders Placed This Encounter  Procedures  . Ambulatory referral to Vascular Surgery    Referral Priority:   Routine    Referral Type:   Surgical    Referral Reason:    Specialty Services Required    Requested Specialty:   Vascular Surgery    Number of Visits Requested:   1    No orders of the defined types were placed in this encounter.    I discussed the assessment and treatment plan with the patient. The patient was provided an opportunity to ask questions and all were answered. The patient agreed with the plan and demonstrated an understanding of the instructions.   The patient was advised to call back or seek an in-person evaluation if the symptoms worsen or if the condition fails to improve as anticipated.  I provided 22 minutes of non-face-to-face time during this encounter.   Tommi Rumps, MD

## 2019-10-13 ENCOUNTER — Ambulatory Visit (INDEPENDENT_AMBULATORY_CARE_PROVIDER_SITE_OTHER): Payer: Medicare Other | Admitting: *Deleted

## 2019-10-13 ENCOUNTER — Telehealth: Payer: Self-pay | Admitting: Cardiovascular Disease

## 2019-10-13 ENCOUNTER — Encounter: Payer: Self-pay | Admitting: Family Medicine

## 2019-10-13 ENCOUNTER — Ambulatory Visit (INDEPENDENT_AMBULATORY_CARE_PROVIDER_SITE_OTHER): Payer: Medicare Other

## 2019-10-13 ENCOUNTER — Encounter (INDEPENDENT_AMBULATORY_CARE_PROVIDER_SITE_OTHER): Payer: Self-pay | Admitting: Nurse Practitioner

## 2019-10-13 ENCOUNTER — Ambulatory Visit (INDEPENDENT_AMBULATORY_CARE_PROVIDER_SITE_OTHER): Payer: Medicare Other | Admitting: Nurse Practitioner

## 2019-10-13 ENCOUNTER — Other Ambulatory Visit: Payer: Self-pay

## 2019-10-13 VITALS — BP 129/75 | HR 109 | Ht 70.0 in | Wt 187.0 lb

## 2019-10-13 DIAGNOSIS — I724 Aneurysm of artery of lower extremity: Secondary | ICD-10-CM | POA: Diagnosis not present

## 2019-10-13 DIAGNOSIS — Z95820 Peripheral vascular angioplasty status with implants and grafts: Secondary | ICD-10-CM

## 2019-10-13 DIAGNOSIS — Z952 Presence of prosthetic heart valve: Secondary | ICD-10-CM

## 2019-10-13 DIAGNOSIS — N189 Chronic kidney disease, unspecified: Secondary | ICD-10-CM

## 2019-10-13 DIAGNOSIS — S7012XD Contusion of left thigh, subsequent encounter: Secondary | ICD-10-CM | POA: Diagnosis not present

## 2019-10-13 DIAGNOSIS — I4891 Unspecified atrial fibrillation: Secondary | ICD-10-CM

## 2019-10-13 DIAGNOSIS — I10 Essential (primary) hypertension: Secondary | ICD-10-CM | POA: Diagnosis not present

## 2019-10-13 DIAGNOSIS — N186 End stage renal disease: Secondary | ICD-10-CM | POA: Diagnosis not present

## 2019-10-13 DIAGNOSIS — Z5181 Encounter for therapeutic drug level monitoring: Secondary | ICD-10-CM

## 2019-10-13 LAB — POCT INR: INR: 3.7 — AB (ref 2.0–3.0)

## 2019-10-13 NOTE — Telephone Encounter (Signed)
If Home Health RN is calling please get Coumadin Nurse on the phone STAT  1.  Are you calling in regards to an appointment? Patient self test at home and result is 3.7 today   2.  Are you calling for a refill ? no  3.  Are you having bleeding issues? no  4.  Do you need clearance to hold Coumadin?    Please route to the Coumadin Clinic Pool

## 2019-10-13 NOTE — Telephone Encounter (Signed)
Please refer to Anticoagulation Encounter for INR details.

## 2019-10-14 ENCOUNTER — Telehealth: Payer: Self-pay

## 2019-10-14 ENCOUNTER — Ambulatory Visit: Payer: Medicare Other | Admitting: Family

## 2019-10-14 NOTE — Telephone Encounter (Signed)
Attempted to call patient and inform them to get a repeat CBC at the hospital. Left a voicemail to call back.

## 2019-10-17 ENCOUNTER — Telehealth: Payer: Self-pay | Admitting: Cardiovascular Disease

## 2019-10-17 ENCOUNTER — Encounter (INDEPENDENT_AMBULATORY_CARE_PROVIDER_SITE_OTHER): Payer: Self-pay | Admitting: Nurse Practitioner

## 2019-10-17 ENCOUNTER — Encounter: Payer: Self-pay | Admitting: Cardiovascular Disease

## 2019-10-17 ENCOUNTER — Ambulatory Visit (INDEPENDENT_AMBULATORY_CARE_PROVIDER_SITE_OTHER): Payer: Medicare Other

## 2019-10-17 DIAGNOSIS — Z5181 Encounter for therapeutic drug level monitoring: Secondary | ICD-10-CM

## 2019-10-17 DIAGNOSIS — I4811 Longstanding persistent atrial fibrillation: Secondary | ICD-10-CM | POA: Diagnosis not present

## 2019-10-17 DIAGNOSIS — Z952 Presence of prosthetic heart valve: Secondary | ICD-10-CM

## 2019-10-17 LAB — POCT INR: INR: 5.3 — AB (ref 2.0–3.0)

## 2019-10-17 NOTE — Telephone Encounter (Signed)
Please see anti-coag note for today. 

## 2019-10-17 NOTE — Telephone Encounter (Signed)
LVM for the patient to call back to ask if he had his CBC results from [facility].

## 2019-10-17 NOTE — Progress Notes (Signed)
Subjective:    Patient ID: Jeremy [name], male    DOB: 07/03/1955, 64 y.o.   MRN: [id number] Chief Complaint  Patient presents with  . Follow-up    1 wek post pseudeudoaneysersm     Jeremy [name] is a 64 year old man the presents today following thrombin injection of pseudoaneurysm on 09/29/2019.  The pseudoaneurysm occurred following right lower extremity angiogram on 08/25/2019.  Unfortunately, after thrombin injection a hematoma occurred which required emergent treatment.  The patient underwent intervention including:  Procedure(s) Performed: 1. Ultrasound guidance for vascular access right femoral artery 2. Catheter placement into left superficial femoral and profunda femoris artery 3. Aortogram and selective left lower extremity angiogram occluding selective images of the profunda and superficial femoral arteries 4.  Covered stent placement to the left superficial femoral artery with 6 mm diameter by 5 and 6 mm diameter by 2.5 cm length Viabahn stents 5.  Covered stent placement to the left profunda femoris artery with 6 mm diameter by 2.5 cm length Viabahn stent             6.  Extension of the stents bilaterally up into the distal common femoral artery with a 6 mm diameter by 7.5 cm length Viabahn stent in the superficial femoral artery and a 6 mm diameter by 5 cm length Viabahn stent in the profunda femoris artery going about 1 to 1-1/2 cm into the common femoral artery distally 7. StarClose closure device right femoral artery  The patient did have some extensive bleeding and is still fatigued due to blood loss.  Ultrasound prior to leaving the hospital showed hematoma measuring 12.1 cm x 6.4 cm x 10.4 cm.  No evidence of pseudoaneurysm was seen.  All the femoral arteries were patent as well as veins.  Today, noninvasive studies show a large residual hematoma measuring 10 cm x 5.7 cm x 7.5 cm, which is a decrease  in size from previous study.  The deep venous system is patent with compressible veins.   Review of Systems  Constitutional: Positive for fatigue.  Musculoskeletal:       Hematoma discomfort  Hematological: Bruises/bleeds easily.  All other systems reviewed and are negative.      Objective:   Physical Exam Vitals reviewed.  HENT:     Head: Normocephalic.  Cardiovascular:     Rate and Rhythm: Normal rate.  Pulmonary:     Effort: Pulmonary effort is normal.  Skin:    Comments: Palpable hematoma left inner thigh  Neurological:     Mental Status: He is alert and oriented to person, place, and time.  Psychiatric:        Mood and Affect: Mood normal.        Behavior: Behavior normal.        Thought Content: Thought content normal.        Judgment: Judgment normal.     BP 129/75   Pulse (!) 109   Ht 5\' 10"  (1.778 m)   Wt 187 lb (84.8 kg)   BMI 26.83 kg/m   Past Medical History:  Diagnosis Date  . Abnormal stress test 03/19/2018  . 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 Aortic stenosis    a. 2014 s/p mech AVR ([facility])-->chronic coumadin; b. 08/2018 Echo: Mild AS/AI. Nl fxn/gradient.  . Arthritis   . Atrial fibrillation ([place])   . CAD (coronary artery disease)    a. 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 02/2018 MV [facility]): large inf, infsept, inflat infarct w/ min rev, EF 30%; c. 06/2018 Cath: LM min irregs, LAD 60p, 95/30/28m, LCX min irregs, RCA 40p, 18m, 90d, RPAV 90, RPDA 95ost; d. 09/2018 CABGx3 (LIMA->LAD, VG->D1, VG->PDA).  . Chronic anticoagulation 06/2012   a. Coumadin in the setting of mech AVR and afib.  Marland Kitchen COVID-19 03/15/2019  . CPAP (continuous positive airway pressure) dependence   . Dialysis patient [facility]) 04/07/2006   Patient started HD around 1996,  had 1st transplant LLQ around 1998 until 2007 when they found renal cell cancer in transplant and both native kidneys, all were removed > went back  on HD until 2nd transplant RLQ in 2014 which lasted 3 yrs; it was embolized for suspected acute rejection. Is currently on HD MWF in [place] w/ [facility] nephrology.  . ESRD (end stage renal disease) ([place])    a. 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 1999 s/p Kidney transplant; c. 2014 Bilat nephrectomy (transplanted kidney resected) in the setting of renal cell carcinoma; d. PD until 11/2017-->HD.  Marland Kitchen Heart murmur   . HFrEF (heart failure with reduced ejection fraction) ([place])    a. 07/2016 Echo: EF 55-60%; b. 02/2018 MV: EF 30%; c. 04/2018 Echo: EF 35%; d. 08/2018 Echo: EF 30-35%. Glob HK w/o rwma. Mildly reduced RV fxn. Mod to sev dil LA. Nl AoV fxn.  Marland Kitchen Hx of colonoscopy    2009 by Dr. Laural Golden. Normal except for small submucosal lipoma. No evidence of polyps or colitis.  . Hyperlipidemia   . Hypertension   . Hypogonadism in male 07/25/2015  . Ischemic cardiomyopathy    a. 07/2016 Echo: EF 55-60%; b. 02/2018 MV: EF 30%; c. 04/2018 Echo: EF 35%; d. 08/2018 Echo: EF 30-35%.  . OSA (obstructive sleep apnea)    No CPAP  . Permanent atrial fibrillation ([facility])    a. CHA2DS2VASc = 3-->Coumadin (also mech AVR); b. 06/2018 Amio started 2/2 elevated rates.  . Renal cell cancer ([place])   . Renal cell carcinoma ([place]) 08/04/2016    Social History   Socioeconomic History  . Marital status: Divorced    Spouse name: Not on file  . Number of children: 1  . Years of education: Not on file  . Highest education level: High school graduate  Occupational History  . Occupation: retired Forensic scientist     Comment: Psychologist, educational  Tobacco Use  . Smoking status: Never Smoker  . Smokeless tobacco: Never Used  Vaping Use  . Vaping Use: Never used  Substance and Sexual Activity  . Alcohol use: No  . Drug use: No  . Sexual activity: Not Currently    Partners: Female  Other Topics Concern  . Not on file  Social History Narrative   Lives at home with girlfriend   1 daughter 49    Social Determinants of Health    Financial Resource Strain:   . Difficulty of Paying Living Expenses:   Food Insecurity:   . Worried About Charity fundraiser in the Last Year:   . Arboriculturist in the Last Year:   Transportation Needs:   . Film/video editor (Medical):   Marland Kitchen Lack of Transportation (Non-Medical):   Physical Activity:   . Days of Exercise per Week:   . Minutes of Exercise per Session:   Stress:   . Feeling of Stress :   Social Connections:   . Frequency of Communication with Friends and Family:   .  Frequency of Social Gatherings with Friends and Family:   . Attends Religious Services:   . Active Member of Clubs or Organizations:   . Attends Archivist Meetings:   Marland Kitchen Marital Status:   Intimate Partner Violence:   . Fear of Current or Ex-Partner:   . Emotionally Abused:   Marland Kitchen Physically Abused:   . Sexually Abused:     Past Surgical History:  Procedure Laterality Date  . AORTIC VALVE REPLACEMENT  3 /11 /2014   At [place]    . CAPD INSERTION N/A 02/19/2017   Procedure: LAPAROSCOPIC INSERTION CONTINUOUS AMBULATORY PERITONEAL DIALYSIS  (CAPD) CATHETER;  Surgeon: Algernon Huxley, MD;  Location: [facility];  Service: General;  Laterality: N/A;  . CORONARY ANGIOPLASTY WITH STENT PLACEMENT    . CORONARY ARTERY BYPASS GRAFT N/A 09/16/2018   Procedure: CORONARY ARTERY BYPASS GRAFT times three      with left internal mammary artery and endoharvest of right leg greater saphenous vein;  Surgeon: Melrose Nakayama, MD;  Location: [place];  Service: Open Heart Surgery;  Laterality: N/A;  . CORONARY STENT INTERVENTION N/A 07/15/2019   Procedure: CORONARY STENT INTERVENTION;  Surgeon: Wellington Hampshire, MD;  Location: [place] [facility];  Service: Cardiovascular;  Laterality: N/A;  . DG AV DIALYSIS  SHUNT ACCESS EXIST*L* OR     left arm  . EPIGASTRIC HERNIA REPAIR N/A 02/19/2017   Procedure: HERNIA REPAIR EPIGASTRIC ADULT;  Surgeon: Robert Bellow,  MD;  Location: [facility];  Service: General;  Laterality: N/A;  . EYE SURGERY     bilateral cataract  . HERNIA REPAIR     UMBILICAL HERNIA  . HERNIA REPAIR  02/19/2017  . INNER EAR SURGERY     20 years ago  . KIDNEY TRANSPLANT    . LEFT HEART CATH AND CORS/GRAFTS ANGIOGRAPHY N/A 07/15/2019   Procedure: LEFT HEART CATH AND CORS/GRAFTS ANGIOGRAPHY;  Surgeon: Wellington Hampshire, MD;  Location: [place] [facility];  Service: Cardiovascular;  Laterality: N/A;  . LOWER EXTREMITY ANGIOGRAPHY Right 08/25/2019   Procedure: LOWER EXTREMITY ANGIOGRAPHY;  Surgeon: Algernon Huxley, MD;  Location: [place] [facility];  Service: Cardiovascular;  Laterality: Right;  . LOWER EXTREMITY ANGIOGRAPHY Left 09/29/2019   Procedure: Lower Extremity Angiography;  Surgeon: Algernon Huxley, MD;  Location: [place] [facility];  Service: Cardiovascular;  Laterality: Left;  . Peritoneal Dialysis access placement  02/19/2017  . PSEUDOANERYSM COMPRESSION N/A 09/29/2019   Procedure: PSEUDOANERYSM COMPRESSION;  Surgeon: Algernon Huxley, MD;  Location: [place] [facility];  Service: Cardiovascular;  Laterality: N/A;  . REMOVAL OF A DIALYSIS CATHETER N/A 11/23/2017   Procedure: REMOVAL OF A PERITONEAL DIALYSIS CATHETER;  Surgeon: Algernon Huxley, MD;  Location: [facility];  Service: Vascular;  Laterality: N/A;  . RIGHT/LEFT HEART CATH AND CORONARY ANGIOGRAPHY Bilateral 06/07/2018   Procedure: RIGHT/LEFT HEART CATH AND CORONARY ANGIOGRAPHY;  Surgeon: Wellington Hampshire, MD;  Location: [place] [facility];  Service: Cardiovascular;  Laterality: Bilateral;  . TEE WITHOUT CARDIOVERSION N/A 07/21/2016   Procedure: TRANSESOPHAGEAL ECHOCARDIOGRAM (TEE);  Surgeon: Wellington Hampshire, MD;  Location: [facility];  Service: Cardiovascular;  Laterality: N/A;  . TEE WITHOUT CARDIOVERSION N/A 09/16/2018   Procedure: TRANSESOPHAGEAL ECHOCARDIOGRAM (TEE);  Surgeon: Melrose Nakayama, MD;  Location: [place];  Service: Open Heart Surgery;  Laterality: N/A;    Family  History  Problem Relation Age of Onset  . Cancer Mother  pancreatic  . Heart disease Father   . Diabetes Father     Allergies  Allergen Reactions  . Statins     Other reaction(s): Arthralgias (intolerance), Myalgias (intolerance)  Pt taking pravastatin   . Sertraline Rash       Assessment & Plan:   1. Essential hypertension Continue antihypertensive medications as already ordered, these medications have been reviewed and there are no changes at this time.   2. Hematoma of left thigh, subsequent encounter Noninvasive studies show that the hematoma is growing smaller.  We will have the patient return to the office in 4 weeks for repeat noninvasive studies to continue to track the decrease in size of his hematoma.  Patient is advised to contact office if it seems that the hematoma is becoming larger or more painful.  Otherwise patient will continue with current regimen. 3. ESRD (end stage renal disease) ([place]) Currently the patient is not experiencing any issues at dialysis.  We will continue to maintain normal follow-up schedule.   Current Outpatient Medications on File Prior to Visit  Medication Sig Dispense Refill  . acetaminophen (TYLENOL) 500 MG tablet Take 1,000 mg 2 (two) times daily as needed by mouth for moderate pain.    Marland Kitchen allopurinol (ZYLOPRIM) 300 MG tablet Take 300 mg by mouth daily.  5  . cholecalciferol (VITAMIN D3) 25 MCG (1000 UNIT) tablet Take 1,000 Units by mouth daily.    . cinacalcet (SENSIPAR) 30 MG tablet Take 30 mg by mouth every other day.     . clopidogrel (PLAVIX) 75 MG tablet Take 1 tablet (75 mg total) by mouth daily with breakfast. 90 tablet 3  . Epoetin Alfa-epbx (RETACRIT IJ) Epoetin alfa - epbx (Retacrit)    . hydroxychloroquine (PLAQUENIL) 200 MG tablet Take 200 mg by mouth 2 (two) times daily.    Marland Kitchen lidocaine-prilocaine (EMLA) cream Apply 1 application topically as needed (for fistula). Monday, Wednesday, Friday for dialysis    . loratadine  (CLARITIN) 10 MG tablet Take 10 mg by mouth daily.    . metoprolol tartrate (LOPRESSOR) 50 MG tablet Take 1 tablet (50 mg total) by mouth 2 (two) times daily. 60 tablet 0  . multivitamin (RENA-VIT) TABS tablet Take 1 tablet by mouth daily.    Marland Kitchen oxyCODONE-acetaminophen (PERCOCET/ROXICET) 5-325 MG tablet Take 1-2 tablets by mouth every 6 (six) hours as needed for severe pain. 50 tablet 0  . pantoprazole (PROTONIX) 40 MG tablet Take 1 tablet (40 mg total) by mouth daily. 90 tablet 3  . rosuvastatin (CRESTOR) 40 MG tablet Take 1 tablet (40 mg total) by mouth daily at 6 PM. 90 tablet 3  . sevelamer carbonate (RENVELA) 800 MG tablet Take 4,000 mg by mouth 3 (three) times daily with meals. 5 tabs per meal     . warfarin (COUMADIN) 1 MG tablet Take 1 mg by mouth daily.    Marland Kitchen zinc sulfate 220 (50 Zn) MG capsule Take 220 mg by mouth daily.    Marland Kitchen ezetimibe (ZETIA) 10 MG tablet Take 1 tablet (10 mg total) by mouth daily. 90 tablet 3  . nitroGLYCERIN (NITROSTAT) 0.4 MG SL tablet Place 1 tablet (0.4 mg total) under the tongue every 5 (five) minutes as needed for chest pain. 25 tablet 3   No current facility-administered medications on file prior to visit.    There are no Patient Instructions on file for this visit. No follow-ups on file.   Kris Hartmann, NP

## 2019-10-17 NOTE — Assessment & Plan Note (Signed)
Related to anemia.  Has done well since discharge.  Discussed rechecking his CBC to determine if he is still significantly anemic though he notes this was checked through dialysis and he would like to wait on those results if possible.  He will let us know what his results were.

## 2019-10-17 NOTE — Telephone Encounter (Signed)
MD INR reporting patient INR: 5.3

## 2019-10-17 NOTE — Assessment & Plan Note (Addendum)
Does continue to have some symptoms.  He has follow-up with vascular surgery tomorrow to have this evaluated with an ultrasound.  Discussed given that his symptoms are relatively stable and not worsening he should follow-up with them tomorrow for evaluation to determine the next step in management.  I also discussed that I would refer him to a vascular surgeon in [place] for his future care.  The patient will let me know who he would like to see there.  Advised to seek medical attention for any worsening symptoms.

## 2019-10-17 NOTE — Patient Instructions (Signed)
Spoke with pt and instructed pt to - have a serving of greens today - skip warfarin today & tomorrow - then continue to take 1 tablet of warfarin daily.  - Recheck INR in 1 week on Monday - if INR still elevated at that time, will decrease warfarin dosage

## 2019-10-17 NOTE — Telephone Encounter (Signed)
Please follow-up with the patient to see if he got his CBC results from dialysis.

## 2019-10-17 NOTE — Telephone Encounter (Signed)
Patient calling with reading INR 5.3

## 2019-10-18 ENCOUNTER — Other Ambulatory Visit: Payer: Self-pay

## 2019-10-18 ENCOUNTER — Ambulatory Visit (INDEPENDENT_AMBULATORY_CARE_PROVIDER_SITE_OTHER): Payer: Medicare Other | Admitting: Family

## 2019-10-18 VITALS — BP 116/70 | HR 100 | Ht 70.0 in | Wt 184.0 lb

## 2019-10-18 DIAGNOSIS — I4821 Permanent atrial fibrillation: Secondary | ICD-10-CM | POA: Diagnosis not present

## 2019-10-18 DIAGNOSIS — D638 Anemia in other chronic diseases classified elsewhere: Secondary | ICD-10-CM

## 2019-10-18 DIAGNOSIS — E785 Hyperlipidemia, unspecified: Secondary | ICD-10-CM

## 2019-10-18 DIAGNOSIS — I729 Aneurysm of unspecified site: Secondary | ICD-10-CM

## 2019-10-18 DIAGNOSIS — I4892 Unspecified atrial flutter: Secondary | ICD-10-CM

## 2019-10-18 DIAGNOSIS — I10 Essential (primary) hypertension: Secondary | ICD-10-CM | POA: Diagnosis not present

## 2019-10-18 DIAGNOSIS — I25118 Atherosclerotic heart disease of native coronary artery with other forms of angina pectoris: Secondary | ICD-10-CM | POA: Diagnosis not present

## 2019-10-18 DIAGNOSIS — D500 Iron deficiency anemia secondary to blood loss (chronic): Secondary | ICD-10-CM

## 2019-10-18 DIAGNOSIS — I255 Ischemic cardiomyopathy: Secondary | ICD-10-CM

## 2019-10-18 DIAGNOSIS — I5022 Chronic systolic (congestive) heart failure: Secondary | ICD-10-CM

## 2019-10-18 DIAGNOSIS — Z952 Presence of prosthetic heart valve: Secondary | ICD-10-CM

## 2019-10-18 MED ORDER — METOPROLOL TARTRATE 75 MG PO TABS: 75.0000 mg | ORAL_TABLET | Freq: Two times a day (BID) | ORAL | 3 refills | Status: DC

## 2019-10-18 NOTE — Progress Notes (Signed)
Office Visit    Patient Name: Jeremy [name] Date of Encounter: 10/19/2019  Primary Care Provider:  Leone Haven, MD Primary Cardiologist:  Kathlyn Sacramento, MD Electrophysiologist:  None   Chief Complaint    Jeremy [name] is a 64 y.o. 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   Past Medical History    Past Medical History:  Diagnosis Date  . Abnormal stress test 03/19/2018  . 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 Aortic stenosis    a. 2014 s/p mech AVR ([facility])-->chronic coumadin; b. 08/2018 Echo: Mild AS/AI. Nl fxn/gradient.  . Arthritis   . Atrial fibrillation ([place])   . CAD (coronary artery disease)    a. 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 02/2018 MV [facility]): large inf, infsept, inflat infarct w/ min rev, EF 30%; c. 06/2018 Cath: LM min irregs, LAD 60p, 95/30/33m, LCX min irregs, RCA 40p, 4m, 90d, RPAV 90, RPDA 95ost; d. 09/2018 CABGx3 (LIMA->LAD, VG->D1, VG->PDA).  . Chronic anticoagulation 06/2012   a. Coumadin in the setting of mech AVR and afib.  Marland Kitchen COVID-19 03/15/2019  . CPAP (continuous positive airway pressure) dependence   . Dialysis patient [facility]) 04/07/2006   Patient started HD around 1996,  had 1st transplant LLQ around 1998 until 2007 when they found renal cell cancer in transplant and both native kidneys, all were removed > went back on HD until 2nd transplant RLQ in 2014 which lasted 3 yrs; it was embolized for suspected acute rejection. Is currently on HD MWF in [place] w/ [facility] nephrology.  . ESRD (end stage renal disease) ([place])    a. CKD 2/2 to IgA nephropathy (dx age 40);  b. 1999 s/p Kidney transplant; c. 2014 Bilat nephrectomy (transplanted kidney resected) in the setting of renal cell carcinoma; d. PD until 11/2017-->HD.  Marland Kitchen Heart murmur   . HFrEF (heart failure with reduced ejection fraction) ([place])    a. 07/2016 Echo: EF 55-60%; b. 02/2018 MV: EF 30%; c. 04/2018 Echo: EF 35%; d. 08/2018 Echo: EF 30-35%. Glob HK w/o rwma. Mildly reduced RV fxn. Mod to sev dil LA. Nl AoV fxn.  Marland Kitchen Hx of colonoscopy    2009 by Dr. Laural Golden. Normal except for small submucosal lipoma. No evidence of polyps or colitis.  . Hyperlipidemia   . Hypertension   . Hypogonadism in male 07/25/2015  . Ischemic cardiomyopathy    a. 07/2016 Echo: EF 55-60%; b. 02/2018 MV: EF 30%; c. 04/2018 Echo: EF 35%; d. 08/2018 Echo: EF 30-35%.  . OSA (obstructive sleep apnea)    No CPAP  . Permanent atrial fibrillation (H[facility])    a. CHA2DS2VASc = 3-->Coumadin (also mech AVR); b. 06/2018 Amio started 2/2 elevated rates.  . Renal cell cancer ([place])   . Renal cell carcinoma ([place]) 08/04/2016   Past Surgical History:  Procedure Laterality Date  . AORTIC VALVE REPLACEMENT  3 /11 /2014   At [place]    . CAPD INSERTION N/A 02/19/2017   Procedure: LAPAROSCOPIC INSERTION CONTINUOUS AMBULATORY PERITONEAL DIALYSIS  (CAPD) CATHETER;  Surgeon: Lucky Cowboy,  Erskine Squibb, MD;  Location: [facility];  Service: General;  Laterality: N/A;  . CORONARY ANGIOPLASTY WITH STENT PLACEMENT    . CORONARY ARTERY BYPASS GRAFT N/A 09/16/2018   Procedure: CORONARY ARTERY BYPASS GRAFT times three      with left internal mammary artery and endoharvest of right leg greater saphenous vein;  Surgeon: Melrose Nakayama, MD;  Location: [place];  Service: Open Heart Surgery;  Laterality: N/A;  . CORONARY STENT INTERVENTION N/A 07/15/2019   Procedure: CORONARY STENT INTERVENTION;  Surgeon: Wellington Hampshire, MD;  Location: [place] [facility];  Service: Cardiovascular;  Laterality: N/A;  . DG AV DIALYSIS  SHUNT ACCESS  EXIST*L* OR     left arm  . EPIGASTRIC HERNIA REPAIR N/A 02/19/2017   Procedure: HERNIA REPAIR EPIGASTRIC ADULT;  Surgeon: Robert Bellow, MD;  Location: [facility];  Service: General;  Laterality: N/A;  . EYE SURGERY     bilateral cataract  . HERNIA REPAIR     UMBILICAL HERNIA  . HERNIA REPAIR  02/19/2017  . INNER EAR SURGERY     20 years ago  . KIDNEY TRANSPLANT    . LEFT HEART CATH AND CORS/GRAFTS ANGIOGRAPHY N/A 07/15/2019   Procedure: LEFT HEART CATH AND CORS/GRAFTS ANGIOGRAPHY;  Surgeon: Wellington Hampshire, MD;  Location: [place] [facility];  Service: Cardiovascular;  Laterality: N/A;  . LOWER EXTREMITY ANGIOGRAPHY Right 08/25/2019   Procedure: LOWER EXTREMITY ANGIOGRAPHY;  Surgeon: Algernon Huxley, MD;  Location: [place] [facility];  Service: Cardiovascular;  Laterality: Right;  . LOWER EXTREMITY ANGIOGRAPHY Left 09/29/2019   Procedure: Lower Extremity Angiography;  Surgeon: Algernon Huxley, MD;  Location: [place] [facility];  Service: Cardiovascular;  Laterality: Left;  . Peritoneal Dialysis access placement  02/19/2017  . PSEUDOANERYSM COMPRESSION N/A 09/29/2019   Procedure: PSEUDOANERYSM COMPRESSION;  Surgeon: Algernon Huxley, MD;  Location: [place] [facility];  Service: Cardiovascular;  Laterality: N/A;  . REMOVAL OF A DIALYSIS CATHETER N/A 11/23/2017   Procedure: REMOVAL OF A PERITONEAL DIALYSIS CATHETER;  Surgeon: Algernon Huxley, MD;  Location: [facility];  Service: Vascular;  Laterality: N/A;  . RIGHT/LEFT HEART CATH AND CORONARY ANGIOGRAPHY Bilateral 06/07/2018   Procedure: RIGHT/LEFT HEART CATH AND CORONARY ANGIOGRAPHY;  Surgeon: Wellington Hampshire, MD;  Location: [place] [facility];  Service: Cardiovascular;  Laterality: Bilateral;  . TEE WITHOUT CARDIOVERSION N/A 07/21/2016   Procedure: TRANSESOPHAGEAL ECHOCARDIOGRAM (TEE);  Surgeon: Wellington Hampshire, MD;  Location: [facility];  Service: Cardiovascular;  Laterality: N/A;  . TEE WITHOUT CARDIOVERSION N/A 09/16/2018   Procedure:  TRANSESOPHAGEAL ECHOCARDIOGRAM (TEE);  Surgeon: Melrose Nakayama, MD;  Location: [place];  Service: Open Heart Surgery;  Laterality: N/A;    Allergies  Allergies  Allergen Reactions  . Statins     Other reaction(s): Arthralgias (intolerance), Myalgias (intolerance)  Pt taking pravastatin   . Sertraline Rash    History of Present Illness    Jeremy [name] is a 64 y.o. 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 He was last seen while hospitalized.  2013 PCi/DES to LAD with nonobstructive RCA and LCx disease. Previously on PD but switched to HD 11/2017 secondary to ineffective fluid removal. Late 2019 noted significant worsening  angina with Lexiscan 02/2018 at [facility] high risk due to large defect which was minimally reversible and EF 30%. Echo 04/2018 LVEF 35%, mildly reduced RV systolic function, moderate biatrial enlargment, mild MR, mild pulmonary hypertension. R/LHC 06/2018 with severe two vessel CAD involving LAD and RCA not optimal for athrectomy and moderate pulmonary hypertension with normal CO. Placed on Amiodarone as HR difficult to control in setting of atrial fib. Underwent 3-vessel CABG June 2020 (LIMA to LAD, SVG to D1, SVG to PDA). Follow up echo 01/2019 LVEF 45-50%, moderate LVH, mildly reduced RV systolic function, severely dilated LA, 26mm St. Jude mechanical AV prosthesis with mean gradient within normal for prosthesis valve.    Admitted 03/2019 with COVID pneumonia treated with remdesivir, IV steroids, prednisone taper. Developed atrial fibrillation with RVR rates 110s-120s. Noted dyspnea post-COVID. Echo 05/24/19 LVEF reduced to 40-45%, mild LVH. Lopressor switched to Carvedilol. Amiodarone has been  discontinued due to his age to prevent adverse effects.   He reported chest pain during clinic follow up 06/2019. Subsequent Lexiscan Myoview was high risk with large fixed defect inferior and inferolateral wall consistent with infarct as well as inferior ischemia and EF 27%.  He underwent cardiac catheterization 07/2019 which showed significant underlying two-vessel coronary artery disease with patent LIMA to LAD and occluded SVG to diagonal.  Diagonal was given collaterals from LAD.  SVG to right PDA was patent but had 99% ostial stenosis.  Underwent successful angioplasty and DES to SVG to RPDA.  He had small hematoma after procedure that did not require intervention.  His Coreg was switched to metoprolol for better rate control.  He was discharged home on Plavix but not aspirin as he is also on anticoagulation with warfarin.  He was seen in clinic 09/06/2019 by Dr. [name] and was doing well at that time.  He follows with vascular for nonhealing right foot ulcer.   He was admitted 09/29/2019 for lower extremity angiogram by vascular surgery with revascularization of the tibial vessels.  However had left groin hematoma and subsequently diagnosed a pseudoaneurysm.  6-24/21 underwent thrombin injection of left femoral artery pseudoaneurysm with ultrasound guidance however presented later that day to the ED with left eye pain and swelling/numbness.  He was anemic with initial hemoglobin 844 downtrending to 6.9.  Underwent repeat intervention of left femoral artery pseudoaneurysm stenting.  09/30/19 during HD developed atrial flutter with RVR along with fever with subsequent an infusion of 1 unit PRBC.  He had NSTEMI due to demand ischemia.  Echo while admission with LVEF 45-50%.  No Crestor secondary to myalgias (tolerates Pravastatin). Zetia added 04/27/19.   Reports feeling overall fatigue since hospital discharge.  He has followed hemodialysis closely and his most recent hemoglobin was 8.7.  Endorses some dyspnea  with exertion but no shortness of breath at rest.  Heart rate at home routinely 100 to 120 bpm.  Tells me blood pressure numbers are well controlled with very few hypotensive readings.  He continues to take midodrine prior to hemodialysis.  He did have fever of 101.8 the other night for which she took a couple of Tylenol and has had not had recurrent fever nor symptoms of infection.  EKGs/Labs/Other Studies Reviewed:   The following studies were reviewed today:    2D echo 10/01/2019: 1. Left ventricular ejection fraction, by estimation, is 45 to 50%. The  left ventricle has mildly decreased function. The left ventricle  demonstrates regional wall motion abnormalities (see scoring  diagram/findings for description). There is moderate  left ventricular hypertrophy. Left ventricular diastolic parameters are  indeterminate. There is moderate hypokinesis of the left ventricular,  basal-mid inferior wall and inferolateral wall.   2. Right ventricular systolic function is normal. The right ventricular  size is normal. Tricuspid regurgitation signal is inadequate for assessing  PA pressure.   3. Left atrial size was moderately dilated.   4. Right atrial size was mildly dilated.   5. The mitral valve is normal in structure. No evidence of mitral valve  regurgitation. No evidence of mitral stenosis.   6. The aortic valve has been repaired/replaced. Aortic valve  regurgitation is not visualized. No aortic stenosis is present. There is a  St. Jude valve present in the aortic position. Aortic valve mean gradient  measures 11.0 mmHg.    EKG:  EKG is  ordered today.  The ekg ordered today demonstrates atrial flutter with variable block 100 bpm  Recent Labs: 03/15/2019: B Natriuretic Peptide 1,821.0 10/03/2019: ALT 10 10/07/2019: BUN 54; Creatinine, Ser 9.41; Hemoglobin 7.1; Magnesium 1.9; Platelets 157; Potassium 4.3; Sodium 139  Recent Lipid Panel    Component Value Date/Time   CHOL 136 06/21/2019  0932   CHOL 179 04/26/2019 0938   TRIG 150 (H) 06/21/2019 0932   HDL 33 (L) 06/21/2019 0932   HDL 38 (L) 04/26/2019 0938   CHOLHDL 4.1 06/21/2019 0932   VLDL 30 06/21/2019 0932   LDLCALC 73 06/21/2019 0932   LDLCALC 111 (H) 04/26/2019 0938   LDLDIRECT 61.0 08/11/2017 1447    Home Medications   Current Meds  Medication Sig  . acetaminophen (TYLENOL) 500 MG tablet Take 1,000 mg 2 (two) times daily as needed by mouth for moderate pain.  Marland Kitchen allopurinol (ZYLOPRIM) 300 MG tablet Take 300 mg by mouth daily.  . cholecalciferol (VITAMIN D3) 25 MCG (1000 UNIT) tablet Take 1,000 Units by mouth daily.  . cinacalcet (SENSIPAR) 30 MG tablet Take 30 mg by mouth every other day.   . clopidogrel (PLAVIX) 75 MG tablet Take 1 tablet (75 mg total) by mouth daily with breakfast.  . Epoetin Alfa-epbx (RETACRIT IJ) Epoetin alfa - epbx (Retacrit)  . ezetimibe (ZETIA) 10 MG tablet Take 1 tablet (10 mg total) by mouth daily.  . hydroxychloroquine (PLAQUENIL) 200 MG tablet Take 200 mg by mouth 2 (two) times daily.  Marland Kitchen lidocaine-prilocaine (EMLA) cream Apply 1 application topically as needed (for fistula). Monday, Wednesday, Friday for dialysis  . loratadine (CLARITIN) 10 MG tablet Take 10 mg by mouth daily.  . metoprolol tartrate 75 MG TABS Take 75 mg by mouth 2 (two) times daily.  . multivitamin (RENA-VIT) TABS tablet Take 1 tablet by mouth daily.  . nitroGLYCERIN (NITROSTAT) 0.4 MG SL tablet Place 1 tablet (0.4 mg total) under the tongue every 5 (five) minutes as needed for chest pain.  Marland Kitchen oxyCODONE-acetaminophen (PERCOCET/ROXICET) 5-325 MG tablet Take 1-2 tablets by mouth every 6 (six) hours as needed for severe pain.  . pantoprazole (PROTONIX) 40 MG tablet Take 1 tablet (40 mg total) by mouth daily.  . rosuvastatin (CRESTOR) 40 MG tablet Take 1 tablet (40 mg total) by mouth daily at 6 PM.  . sevelamer carbonate (RENVELA) 800 MG tablet Take 4,000 mg by mouth 3 (three) times daily with meals. 5 tabs per meal     . warfarin (COUMADIN) 1 MG tablet Take 1 mg by mouth daily.  Marland Kitchen zinc sulfate 220 (50 Zn) MG capsule Take 220 mg by mouth daily.  . [DISCONTINUED] metoprolol tartrate (LOPRESSOR) 50 MG tablet  Take 1 tablet (50 mg total) by mouth 2 (two) times daily.      Review of Systems       Review of Systems  Constitutional: Positive for malaise/fatigue. Negative for chills and fever.  Cardiovascular: Positive for dyspnea on exertion. Negative for chest pain, irregular heartbeat, leg swelling, near-syncope, orthopnea, palpitations and syncope.  Respiratory: Negative for shortness of breath and wheezing.   Gastrointestinal: Negative for melena, nausea and vomiting.  Genitourinary: Negative for hematuria.  Neurological: Negative for dizziness, light-headedness and weakness.   All other systems reviewed and are otherwise negative except as noted above.  Physical Exam    VS:  BP 116/70 (BP Location: Right Arm, Patient Position: Sitting, Cuff Size: Normal)   Pulse 100   Ht 5\' 10"  (1.778 m)   Wt 184 lb (83.5 kg)   BMI 26.40 kg/m  , BMI Body mass index is 26.4 kg/m. GEN: Well nourished, well developed, in no acute distress. HEENT: normal. Neck: Supple, no JVD, carotid bruits, or masses. Cardiac: RRR, valve click, no murmurs, rubs, or gallops. No clubbing, cyanosis, edema.  Radials/DP/PT 2+ and equal bilaterally.  Respiratory:  Respirations regular and unlabored, clear to auscultation bilaterally. GI: Soft, nontender, nondistended, BS + x 4. MS: No deformity or atrophy. Skin: Warm and dry, no rash. Neuro:  Strength and sensation are intact. Psych: Normal affect.  Assessment & Plan    1. CAD involving native coronary arteries s/p CABG s/p subsequent PCI to the SVG to RPDA -underwent PCI DES to SVG-RPDA 07/15/2019.  Reports no chest pain, pressure, tightness.  No indication for ischemic evaluation this time.  Continue beta-blocker and statin.  Continue clopidogrel without aspirin due to  warfarin.  2. Permanent atrial fibrillation with RVR/atrial flutter -rate has been more difficult to control in the setting of acute blood loss anemia.  Heart rate at home routinely 100 to 120 bpm.  Not associated with fatigue which is likely multifactorial tachycardia, comorbidities, anemia.  Increase metoprolol to 75 mg twice daily.  We did discuss that as his anemia improves we may need to down titrate in the future.  3. HFrEF secondary to ICM -Echo 10/01/2019 with LVEF 45-50% which is improved compared to previous. No ACE/ARB/vasodilator due to CKD and hypotension.  Continue beta-blocker.  His Coreg has had be switched to metoprolol for optimal heart rate control.  He is euvolemic on examination volume status is managed by dialysis.  4. PAD s/p recent intervention complicated by pseudoaneurysm s/p thrombin injection s/p stenting with blood loss anemia -hemoglobin on discharge 7.1 consistent discharge with HD now 8.9.  Following closely with vascular surgery after recent pseudoaneurysm requiring repair and stenting.  Reports hematoma is decreasing in size.  Anticipate managing his ED, dyspnea is in the setting of recent hospitalization and anemia.  5. Severe AS s/p mechanical AVR-continue warfarin.  Echo 10/01/2019 with valve functioning appropriately.  Mean gradient 11 mmHg.  6. Hyperkalemia/ESRD -on HD 3 days/week.  Continue to follow nephrology.  7. HTN -BP well controlled continue present antihypertensive regimen.  8. HLD, LDL goal less than 70 - 06/21/19 LDL 73.  Continue Zetia 10 mg daily and rosuvastatin 40 mg daily.  Disposition: Follow up in 6 week(s) with Dr. [name] or APP   Loel Dubonnet, NP 10/19/2019, 7:23 AM

## 2019-10-18 NOTE — Patient Instructions (Signed)
Medication Instructions:   Your physician has recommended you make the following change in your medication:   CHANGE Metoprolol Tartrate 75 mg twice daily (1.5 tablets of your 50mg  tablet)  *If you need a refill on your cardiac medications before your next appointment, please call your pharmacy*  Lab Work: No lab work today.  Testing/Procedures: Your EKG today shows atrial flutter with variable block at a rate of 100bpm which is a stable finding.  Follow-Up: At [facility], you and your health needs are our priority.  As part of our continuing mission to provide you with exceptional heart care, we have created designated Provider Care Teams.  These Care Teams include your primary Cardiologist (physician) and Advanced Practice Providers (APPs -  Physician Assistants and Nurse Practitioners) who all work together to provide you with the care you need, when you need it.  We recommend signing up for the patient portal called "MyChart".  Sign up information is provided on this After Visit Summary.  MyChart is used to connect with patients for Virtual Visits (Telemedicine).  Patients are able to view lab/test results, encounter notes, upcoming appointments, etc.  Non-urgent messages can be sent to your provider as well.   To learn more about what you can do with MyChart, go to [email or web address].    Your next appointment:   6 week(s)  The format for your next appointment:   In Person  Provider:   You may see Kathlyn Sacramento, MD or one of the following Advanced Practice Providers on your designated Care Team:    Murray Hodgkins, NP  Christell Faith, PA-C  Marrianne Mood, PA-C  Laurann Montana, NP  Other Instructions

## 2019-10-19 ENCOUNTER — Ambulatory Visit (INDEPENDENT_AMBULATORY_CARE_PROVIDER_SITE_OTHER): Payer: Medicare Other | Admitting: Family Medicine

## 2019-10-19 ENCOUNTER — Encounter: Payer: Self-pay | Admitting: Family

## 2019-10-19 ENCOUNTER — Encounter: Payer: Self-pay | Admitting: Family Medicine

## 2019-10-19 VITALS — BP 116/69 | HR 106 | Temp 98.6°F | Ht 70.0 in | Wt 184.2 lb

## 2019-10-19 DIAGNOSIS — I255 Ischemic cardiomyopathy: Secondary | ICD-10-CM

## 2019-10-19 DIAGNOSIS — R5383 Other fatigue: Secondary | ICD-10-CM

## 2019-10-19 DIAGNOSIS — R5081 Fever presenting with conditions classified elsewhere: Secondary | ICD-10-CM | POA: Diagnosis not present

## 2019-10-19 DIAGNOSIS — S7012XD Contusion of left thigh, subsequent encounter: Secondary | ICD-10-CM

## 2019-10-19 DIAGNOSIS — G479 Sleep disorder, unspecified: Secondary | ICD-10-CM | POA: Diagnosis not present

## 2019-10-19 MED ORDER — TRAZODONE HCL 50 MG PO TABS: 25.0000 mg | ORAL_TABLET | Freq: Every evening | ORAL | 3 refills | Status: DC | PRN

## 2019-10-19 NOTE — Assessment & Plan Note (Signed)
Has been improving in size per patient report.  He will continue to monitor.

## 2019-10-19 NOTE — Patient Instructions (Addendum)
Nice to see you. You can take the trazodone to see if that will help his sleep. Please go to [email or web address] to schedule a COVID19 test given your cough and prior fever. Or you can go through the drive through testing at one of the local [facility] pharmacies. Please get the PCR test done to evaluate for COVID19.  Please have labs completed at the lab at the hospital. You do not need an appointment for this.  You need to quarantine at home until your covid test result comes back. Please let us know what the result is. Please let your dialysis center know about your symptoms so they can determine how they would like to proceed.

## 2019-10-19 NOTE — Progress Notes (Signed)
Tommi Rumps, MD Phone: [phone]  Jeremy [name] is a 64 y.o. male who presents today for f/u.  Difficulty sleeping: Patient notes this has been going on since he got out of the hospital in the last several weeks.  He does not feel stressed.  No anxiety or depression.  He will fall asleep for about 20 minutes and then have to get up and watch TV.  Patient also notes he has no appetite since getting out of the hospital.  He did have diarrhea for a while and took Imodium and probiotic.  He is now only on the probiotic and the diarrhea is resolved.  No abdominal pain.  No nausea or vomiting.  He does not have a taste for food though has not lost his sense of taste.  He did have a fever last week of 101 F at night.  He took Tylenol and that went away.  He had a dry cough starting last week as well.  Social History   Tobacco Use  Smoking Status Never Smoker  Smokeless Tobacco Never Used     ROS see history of present illness  Objective  Physical Exam Vitals:   10/19/19 1535  BP: 116/69  Pulse: (!) 106  Temp: 98.6 F (37 C)  SpO2: 99%    BP Readings from Last 3 Encounters:  10/19/19 116/69  10/18/19 116/70  10/13/19 129/75   Wt Readings from Last 3 Encounters:  10/19/19 184 lb 3.2 oz (83.6 kg)  10/18/19 184 lb (83.5 kg)  10/13/19 187 lb (84.8 kg)    Physical Exam Physical exam was not performed during this visit as during the course of the interview the patient noted he had had fever and has had cough over the past week.  Given our current protocols with COVID-19 and the fact that I did not pass my N95 fit testing I had to leave the room.  CMA escorted the patient from the building and cleaned the room appropriately.  Assessment/Plan: Please see individual problem list.  Fatigue Labs as outlined below.  Fever Patient with fever on one occasion last week.  Has had cough since then.  I encouraged him to get tested for COVID-19.  CMA went over places where he could  get tested.  It would seem unlikely to be related to COVID-19 given that he has had COVID-19 and has been vaccinated since then.  Discussed that its not impossible he had Covid a second time and I encouraged him to get tested.  Information in his AVS regarding quarantine until his test result returns.  CMA discussed that with the patient after I left the room.  We also encouraged the patient to contact his dialysis center to see how they would like to proceed with his dialysis given his cough and recent fever.  Difficulty sleeping We will give him a trial of trazodone for sleep.  Thigh hematoma Has been improving in size per patient report.  He will continue to monitor.   Orders Placed This Encounter  Procedures  . Comp Met (CMET)    Standing Status:   Future    Standing Expiration Date:   10/18/2020  . TSH    Standing Status:   Future    Standing Expiration Date:   10/18/2020  . CBC w/Diff    Standing Status:   Future    Standing Expiration Date:   10/18/2020    Meds ordered this encounter  Medications  . traZODone (DESYREL) 50 MG tablet  Sig: Take 0.5-1 tablets (25-50 mg total) by mouth at bedtime as needed for sleep.    Dispense:  30 tablet    Refill:  3    This visit occurred during the SARS-CoV-2 public health emergency.  Safety protocols were in place, including screening questions prior to the visit, additional usage of staff PPE, and extensive cleaning of exam room while observing appropriate contact time as indicated for disinfecting solutions.    Tommi Rumps, MD [place]

## 2019-10-19 NOTE — Assessment & Plan Note (Signed)
We will give him a trial of trazodone for sleep.

## 2019-10-19 NOTE — Assessment & Plan Note (Signed)
Patient with fever on one occasion last week.  Has had cough since then.  I encouraged him to get tested for COVID-19.  CMA went over places where he could get tested.  It would seem unlikely to be related to COVID-19 given that he has had COVID-19 and has been vaccinated since then.  Discussed that its not impossible he had Covid a second time and I encouraged him to get tested.  Information in his AVS regarding quarantine until his test result returns.  CMA discussed that with the patient after I left the room.  We also encouraged the patient to contact his dialysis center to see how they would like to proceed with his dialysis given his cough and recent fever.

## 2019-10-19 NOTE — Assessment & Plan Note (Signed)
Labs as outlined below. 

## 2019-10-23 ENCOUNTER — Encounter (INDEPENDENT_AMBULATORY_CARE_PROVIDER_SITE_OTHER): Payer: Self-pay

## 2019-10-24 ENCOUNTER — Telehealth: Payer: Self-pay | Admitting: Cardiovascular Disease

## 2019-10-24 ENCOUNTER — Encounter: Payer: Self-pay | Admitting: Cardiovascular Disease

## 2019-10-24 ENCOUNTER — Ambulatory Visit (INDEPENDENT_AMBULATORY_CARE_PROVIDER_SITE_OTHER): Payer: Medicare Other

## 2019-10-24 DIAGNOSIS — I4811 Longstanding persistent atrial fibrillation: Secondary | ICD-10-CM

## 2019-10-24 DIAGNOSIS — Z5181 Encounter for therapeutic drug level monitoring: Secondary | ICD-10-CM

## 2019-10-24 DIAGNOSIS — Z952 Presence of prosthetic heart valve: Secondary | ICD-10-CM | POA: Diagnosis not present

## 2019-10-24 LAB — POCT INR: INR: 2.7 (ref 2.0–3.0)

## 2019-10-24 NOTE — Telephone Encounter (Signed)
Please see anti-coag note for today. 

## 2019-10-24 NOTE — Telephone Encounter (Signed)
Patient calling INR result today : 2.7

## 2019-10-24 NOTE — Patient Instructions (Signed)
ent pt MyChart message advising him to: - then continue to take 1 tablet of warfarin daily.  - Recheck INR in 1 week on Monday

## 2019-10-31 ENCOUNTER — Encounter: Payer: Self-pay | Admitting: Cardiovascular Disease

## 2019-10-31 ENCOUNTER — Ambulatory Visit (INDEPENDENT_AMBULATORY_CARE_PROVIDER_SITE_OTHER): Payer: Medicare Other

## 2019-10-31 DIAGNOSIS — Z5181 Encounter for therapeutic drug level monitoring: Secondary | ICD-10-CM | POA: Diagnosis not present

## 2019-10-31 DIAGNOSIS — Z952 Presence of prosthetic heart valve: Secondary | ICD-10-CM

## 2019-10-31 DIAGNOSIS — I4811 Longstanding persistent atrial fibrillation: Secondary | ICD-10-CM | POA: Diagnosis not present

## 2019-10-31 LAB — POCT INR: INR: 1.8 — AB (ref 2.0–3.0)

## 2019-10-31 NOTE — Patient Instructions (Signed)
Spoke w/ pt & advised him to: -  take an extra warfarin tablet tonight, then  - continue to take 1 tablet of warfarin daily.  - Recheck INR in 1 week on Monday

## 2019-11-01 ENCOUNTER — Ambulatory Visit: Payer: Medicare Other | Admitting: Cardiovascular Disease

## 2019-11-02 ENCOUNTER — Other Ambulatory Visit (INDEPENDENT_AMBULATORY_CARE_PROVIDER_SITE_OTHER): Payer: Self-pay | Admitting: Vascular Surgery

## 2019-11-07 ENCOUNTER — Ambulatory Visit (INDEPENDENT_AMBULATORY_CARE_PROVIDER_SITE_OTHER): Payer: Medicare Other

## 2019-11-07 ENCOUNTER — Encounter: Payer: Self-pay | Admitting: Cardiovascular Disease

## 2019-11-07 DIAGNOSIS — I4811 Longstanding persistent atrial fibrillation: Secondary | ICD-10-CM

## 2019-11-07 DIAGNOSIS — Z952 Presence of prosthetic heart valve: Secondary | ICD-10-CM

## 2019-11-07 LAB — LAB REPORT - SCANNED: INR: 4.1

## 2019-11-07 LAB — POCT INR: INR: 4.1 — AB (ref 2.0–3.0)

## 2019-11-07 NOTE — Patient Instructions (Signed)
Spoke w/ pt & advised him to: -  skip warfarin tonight, then  - continue to take 1 tablet of warfarin daily.  - Recheck INR in 1 week on Monday

## 2019-11-10 ENCOUNTER — Other Ambulatory Visit: Payer: Self-pay | Admitting: Family Medicine

## 2019-11-14 ENCOUNTER — Ambulatory Visit (INDEPENDENT_AMBULATORY_CARE_PROVIDER_SITE_OTHER): Payer: Medicare Other

## 2019-11-14 DIAGNOSIS — I4811 Longstanding persistent atrial fibrillation: Secondary | ICD-10-CM | POA: Diagnosis not present

## 2019-11-14 DIAGNOSIS — Z952 Presence of prosthetic heart valve: Secondary | ICD-10-CM | POA: Diagnosis not present

## 2019-11-14 DIAGNOSIS — Z5181 Encounter for therapeutic drug level monitoring: Secondary | ICD-10-CM

## 2019-11-14 LAB — POCT INR: INR: 2.8 (ref 2.0–3.0)

## 2019-11-14 NOTE — Patient Instructions (Signed)
Sent MyChart message to pt advising him to : - continue to take 1 tablet of warfarin daily.  - Recheck INR in 1 week on Monday

## 2019-11-15 ENCOUNTER — Encounter: Payer: Self-pay | Admitting: Vascular Surgery

## 2019-11-15 ENCOUNTER — Other Ambulatory Visit: Payer: Self-pay

## 2019-11-15 ENCOUNTER — Ambulatory Visit (INDEPENDENT_AMBULATORY_CARE_PROVIDER_SITE_OTHER): Payer: Medicare Other | Admitting: Vascular Surgery

## 2019-11-15 VITALS — BP 147/85 | HR 109 | Temp 97.2°F | Resp 18 | Ht 69.0 in | Wt 179.1 lb

## 2019-11-15 DIAGNOSIS — N186 End stage renal disease: Secondary | ICD-10-CM | POA: Diagnosis not present

## 2019-11-15 DIAGNOSIS — I255 Ischemic cardiomyopathy: Secondary | ICD-10-CM | POA: Diagnosis not present

## 2019-11-15 DIAGNOSIS — I739 Peripheral vascular disease, unspecified: Secondary | ICD-10-CM | POA: Diagnosis not present

## 2019-11-15 NOTE — Progress Notes (Signed)
Vascular and Vein Specialist of [facility]  Patient name: Jeremy [name] MRN: [id number] DOB: 09-26-1955 Sex: male  REASON FOR CONSULT: Establish vascular care in [place]  HPI: Jeremy [name] is a 64 y.o. male, who is here today for establishing vascular care in [place].  He has a complex recent history.  He has a long history of end-stage renal disease.  He had an area of nonhealing ulceration in his right foot and on 08/25/2019 underwent arteriography via the right femoral approach at [facility].  He had angioplasty of the tibioperoneal trunk, peroneal and posterior tibial arteries on the right with drug-eluting balloon.  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  Apparently had complication of bleeding follow this.  He was taken to [facility] lab and underwent Viabahn stenting of his deep femoral artery, superficial femoral artery and common femoral artery for control of bleeding.  He is dissatisfied with care at [facility] and is requesting that we assume his care.  He does have ulceration over the tip of his right great toe.  This does not have any significant progression.  He denies claudication in his right or left leg. Past Medical History:  Diagnosis Date  . Abnormal stress test 03/19/2018  . 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 Aortic stenosis    a. 2014 s/p mech AVR ([facility])-->chronic coumadin; b. 08/2018 Echo: Mild AS/AI. Nl fxn/gradient.  . Arthritis   . Atrial fibrillation ([place])   . CAD (coronary artery disease)    a. 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 02/2018 MV [facility]): large inf, infsept, inflat infarct w/ min rev, EF 30%; c. 06/2018 Cath: LM min irregs, LAD 60p, 95/30/6m, LCX min irregs, RCA 40p, 71m, 90d, RPAV 90, RPDA 95ost; d. 09/2018 CABGx3 (LIMA->LAD, VG->D1, VG->PDA).  . Chronic anticoagulation 06/2012   a. Coumadin  in the setting of mech AVR and afib.  Marland Kitchen COVID-19 03/15/2019  . CPAP (continuous positive airway pressure) dependence   . Dialysis patient [facility]) 04/07/2006   Patient started HD around 1996,  had 1st transplant LLQ around 1998 until 2007 when they found renal cell cancer in transplant and both native kidneys, all were removed > went back on HD until 2nd transplant RLQ in 2014 which lasted 3 yrs; it was embolized for suspected acute rejection. Is currently on HD MWF in [place] w/ [facility] nephrology.  . ESRD (end stage renal disease) ([place])    a. 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 1999 s/p Kidney transplant; c. 2014 Bilat nephrectomy (transplanted kidney resected) in the setting of renal cell carcinoma; d. PD until 11/2017-->HD.  Marland Kitchen Heart murmur   . HFrEF (heart failure with reduced ejection fraction) ([place])    a. 07/2016 Echo: EF 55-60%; b. 02/2018 MV: EF 30%; c. 04/2018 Echo: EF 35%; d. 08/2018 Echo: EF 30-35%. Glob HK w/o rwma. Mildly reduced RV fxn. Mod to sev dil LA. Nl AoV fxn.  Marland Kitchen Hx of colonoscopy    2009 by Dr. Laural Golden. Normal except for small submucosal lipoma. No evidence of polyps or colitis.  . Hyperlipidemia   . Hypertension   . Hypogonadism in male 07/25/2015  . Ischemic cardiomyopathy    a. 07/2016 Echo: EF 55-60%; b. 02/2018 MV: EF 30%; c. 04/2018 Echo: EF 35%; d. 08/2018 Echo: EF 30-35%.  . OSA (obstructive sleep apnea)  No CPAP  . Permanent atrial fibrillation ([facility])    a. CHA2DS2VASc = 3-->Coumadin (also mech AVR); b. 06/2018 Amio started 2/2 elevated rates.  . Renal cell cancer ([place])   . Renal cell carcinoma ([place]) 08/04/2016    Family History  Problem Relation Age of Onset  . Cancer Mother        pancreatic  . Heart disease Father   . Diabetes Father     SOCIAL HISTORY: Social History   Socioeconomic History  . Marital status: Divorced    Spouse name: Not on file  . Number of children: 1  . Years of education: Not on file  . Highest education level: High school  graduate  Occupational History  . Occupation: retired Forensic scientist     Comment: Psychologist, educational  Tobacco Use  . Smoking status: Never Smoker  . Smokeless tobacco: Never Used  Vaping Use  . Vaping Use: Never used  Substance and Sexual Activity  . Alcohol use: No  . Drug use: No  . Sexual activity: Not Currently    Partners: Female  Other Topics Concern  . Not on file  Social History Narrative   Lives at home with girlfriend   1 daughter 39    Social Determinants of Health   Financial Resource Strain:   . Difficulty of Paying Living Expenses:   Food Insecurity:   . Worried About Charity fundraiser in the Last Year:   . Arboriculturist in the Last Year:   Transportation Needs:   . Film/video editor (Medical):   Marland Kitchen Lack of Transportation (Non-Medical):   Physical Activity:   . Days of Exercise per Week:   . Minutes of Exercise per Session:   Stress:   . Feeling of Stress :   Social Connections:   . Frequency of Communication with Friends and Family:   . Frequency of Social Gatherings with Friends and Family:   . Attends Religious Services:   . Active Member of Clubs or Organizations:   . Attends Archivist Meetings:   Marland Kitchen Marital Status:   Intimate Partner Violence:   . Fear of Current or Ex-Partner:   . Emotionally Abused:   Marland Kitchen Physically Abused:   . Sexually Abused:     Allergies  Allergen Reactions  . Statins     Other reaction(s): Arthralgias (intolerance), Myalgias (intolerance)  Pt taking pravastatin   . Sertraline Rash    Current Outpatient Medications  Medication Sig Dispense Refill  . acetaminophen (TYLENOL) 500 MG tablet Take 1,000 mg 2 (two) times daily as needed by mouth for moderate pain.    Marland Kitchen allopurinol (ZYLOPRIM) 300 MG tablet Take 300 mg by mouth daily.  5  . cholecalciferol (VITAMIN D3) 25 MCG (1000 UNIT) tablet Take 1,000 Units by mouth daily.    . cinacalcet (SENSIPAR) 30 MG tablet Take 30 mg by mouth every other day.     .  clopidogrel (PLAVIX) 75 MG tablet Take 1 tablet (75 mg total) by mouth daily with breakfast. 90 tablet 3  . Epoetin Alfa-epbx (RETACRIT IJ) Epoetin alfa - epbx (Retacrit)    . hydroxychloroquine (PLAQUENIL) 200 MG tablet Take 200 mg by mouth 2 (two) times daily.    Marland Kitchen lidocaine-prilocaine (EMLA) cream Apply 1 application topically as needed (for fistula). Monday, Wednesday, Friday for dialysis    . loratadine (CLARITIN) 10 MG tablet Take 10 mg by mouth daily.    . metoprolol tartrate 75 MG TABS Take 75 mg  by mouth 2 (two) times daily. 180 tablet 3  . multivitamin (RENA-VIT) TABS tablet Take 1 tablet by mouth daily.    Marland Kitchen oxyCODONE-acetaminophen (PERCOCET/ROXICET) 5-325 MG tablet Take 1-2 tablets by mouth every 6 (six) hours as needed for severe pain. 50 tablet 0  . pantoprazole (PROTONIX) 40 MG tablet Take 1 tablet (40 mg total) by mouth daily. 90 tablet 3  . rosuvastatin (CRESTOR) 40 MG tablet Take 1 tablet (40 mg total) by mouth daily at 6 PM. 90 tablet 3  . sevelamer carbonate (RENVELA) 800 MG tablet Take 4,000 mg by mouth 3 (three) times daily with meals. 5 tabs per meal     . traZODone (DESYREL) 50 MG tablet TAKE 1/2 -1 TABLET (25-50 MG TOTAL) BY MOUTH AT BEDTIME AS NEEDED FOR SLEEP. 90 tablet 2  . warfarin (COUMADIN) 1 MG tablet Take 1 mg by mouth daily.    Marland Kitchen zinc sulfate 220 (50 Zn) MG capsule Take 220 mg by mouth daily.    Marland Kitchen ezetimibe (ZETIA) 10 MG tablet Take 1 tablet (10 mg total) by mouth daily. 90 tablet 3  . nitroGLYCERIN (NITROSTAT) 0.4 MG SL tablet Place 1 tablet (0.4 mg total) under the tongue every 5 (five) minutes as needed for chest pain. 25 tablet 3   No current facility-administered medications for this visit.    REVIEW OF SYSTEMS:  [X]  denotes positive finding, [ ]  denotes negative finding Cardiac  Comments:  Chest pain or chest pressure: x   Shortness of breath upon exertion: x   Short of breath when lying flat:    Irregular heart rhythm: x       Vascular    Pain in  calf, thigh, or hip brought on by ambulation: x   Pain in feet at night that wakes you up from your sleep:  x   Blood clot in your veins:    Leg swelling:  x       Pulmonary    Oxygen at home: x   Productive cough:  x   Wheezing:  x       Neurologic    Sudden weakness in arms or legs:  x   Sudden numbness in arms or legs:  x   Sudden onset of difficulty speaking or slurred speech:    Temporary loss of vision in one eye:     Problems with dizziness:         Gastrointestinal    Blood in stool:     Vomited blood:         Genitourinary    Burning when urinating:     Blood in urine:        Psychiatric    Major depression:         Hematologic    Bleeding problems: x   Problems with blood clotting too easily:        Skin    Rashes or ulcers:        Constitutional    Fever or chills:      PHYSICAL EXAM: Vitals:   11/15/19 1000  BP: (!) 147/85  Pulse: (!) 109  Resp: 18  Temp: (!) 97.2 F (36.2 C)  TempSrc: Temporal  SpO2: 98%  Weight: 179 lb 1.6 oz (81.2 kg)  Height: 5\' 9"  (1.753 m)    GENERAL: The patient is a well-nourished male, in no acute distress. The vital signs are documented above. CARDIOVASCULAR: Dopplerable right radial pulse.  Large radiocephalic AV fistula on his left forearm.  2+ popliteal pulses bilaterally.  Absent pedal pulses bilaterally PULMONARY: There is good air exchange  ABDOMEN: Soft and non-tender  MUSCULOSKELETAL: There are no major deformities or cyanosis. NEUROLOGIC: No focal weakness or paresthesias are detected. SKIN: There are no ulcers or rashes noted. PSYCHIATRIC: The patient has a normal affect.  DATA:  Hand-held Doppler reveals dampened monophasic flow in the right posterior tibial with no audible anterior tibial or peroneal flow.  He does have audible flow in the left posterior tibial and peroneal with no flow in the dorsalis pedis  MEDICAL ISSUES: Very complicated history.  Does have palpable popliteal pulses bilaterally  therefore does have patency of extensive covered stenting done in his left groin.  Peers to have marginal flow in his right foot for healing of the ulceration on the tip.  We will see him again at short interval follow-up in 1 month to assure that he does not have any progression.  He understands that he may need repeat arteriography for planning for right leg revascularization.  Also explained the the endovascular treatment in his left groin could potentially be problematic in the future if he has recurrent disease in the common superficial femoral and profundus since these are all now treated with covered stents.  He does have normal flow through this area currently.  We will see him again in 1 month for continued follow-up   Rosetta Posner, MD [facility] Vascular and Vein Specialists of [facility] Tel [phone] Pager [phone]

## 2019-11-16 ENCOUNTER — Other Ambulatory Visit: Payer: Self-pay | Admitting: *Deleted

## 2019-11-16 DIAGNOSIS — I7025 Atherosclerosis of native arteries of other extremities with ulceration: Secondary | ICD-10-CM

## 2019-11-16 DIAGNOSIS — I739 Peripheral vascular disease, unspecified: Secondary | ICD-10-CM

## 2019-11-17 ENCOUNTER — Encounter: Payer: Self-pay | Admitting: Family Medicine

## 2019-11-17 DIAGNOSIS — R05 Cough: Secondary | ICD-10-CM

## 2019-11-17 DIAGNOSIS — R053 Chronic cough: Secondary | ICD-10-CM

## 2019-11-21 ENCOUNTER — Ambulatory Visit (INDEPENDENT_AMBULATORY_CARE_PROVIDER_SITE_OTHER): Payer: Medicare Other

## 2019-11-21 ENCOUNTER — Encounter: Payer: Self-pay | Admitting: Cardiovascular Disease

## 2019-11-21 DIAGNOSIS — I4811 Longstanding persistent atrial fibrillation: Secondary | ICD-10-CM | POA: Diagnosis not present

## 2019-11-21 DIAGNOSIS — Z5181 Encounter for therapeutic drug level monitoring: Secondary | ICD-10-CM | POA: Diagnosis not present

## 2019-11-21 DIAGNOSIS — Z952 Presence of prosthetic heart valve: Secondary | ICD-10-CM

## 2019-11-21 LAB — POCT INR: INR: 4.1 — AB (ref 2.0–3.0)

## 2019-11-21 NOTE — Patient Instructions (Signed)
Spoke w/ pt and advised him to : - skip warfarin tonight, then  - continue to take 1 tablet of warfarin daily.  - Recheck INR in 1 week on Monday

## 2019-11-22 ENCOUNTER — Encounter (INDEPENDENT_AMBULATORY_CARE_PROVIDER_SITE_OTHER): Payer: Medicare Other

## 2019-11-22 ENCOUNTER — Ambulatory Visit (INDEPENDENT_AMBULATORY_CARE_PROVIDER_SITE_OTHER): Payer: Medicare Other | Admitting: Vascular Surgery

## 2019-11-23 NOTE — Progress Notes (Signed)
Office Visit    Patient Name: Jeremy [name] Date of Encounter: 11/25/2019  Primary Care Provider:  Leone Haven, MD Primary Cardiologist:  Kathlyn Sacramento, MD Electrophysiologist:  None   Chief Complaint    Jeremy [name] is a 64 y.o. 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 Abnormal stress test 03/19/2018  . 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 Aortic stenosis    a. 2014 s/p mech AVR ([facility])-->chronic coumadin; b. 08/2018 Echo: Mild AS/AI. Nl fxn/gradient.  . Arthritis   . Atrial fibrillation ([place])   . CAD (coronary artery disease)    a. 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 02/2018 MV [facility]): large inf, infsept, inflat infarct w/ min rev, EF 30%; c. 06/2018 Cath: LM min irregs, LAD 60p, 95/30/29m, LCX min irregs, RCA 40p, 14m, 90d, RPAV 90, RPDA 95ost; d. 09/2018 CABGx3 (LIMA->LAD, VG->D1, VG->PDA).  . Chronic anticoagulation 06/2012   a. Coumadin in the setting of mech AVR and afib.  Marland Kitchen COVID-19 03/15/2019  . CPAP (continuous positive airway pressure) dependence   . Dialysis patient [facility]) 04/07/2006   Patient started HD around 1996,  had 1st transplant LLQ around 1998 until 2007 when they found renal cell cancer in transplant and both native kidneys, all were removed > went back on HD until 2nd transplant RLQ in 2014 which lasted 3 yrs; it was embolized for suspected acute rejection. Is currently on HD MWF in [place] w/ [facility] nephrology.  . ESRD (end stage renal disease) ([place])    a. CKD 2/2 to IgA nephropathy (dx age 23); b.  1999 s/p Kidney transplant; c. 2014 Bilat nephrectomy (transplanted kidney resected) in the setting of renal cell carcinoma; d. PD until 11/2017-->HD.  Marland Kitchen Heart murmur   . HFrEF (heart failure with reduced ejection fraction) ([place])    a. 07/2016 Echo: EF 55-60%; b. 02/2018 MV: EF 30%; c. 04/2018 Echo: EF 35%; d. 08/2018 Echo: EF 30-35%. Glob HK w/o rwma. Mildly reduced RV fxn. Mod to sev dil LA. Nl AoV fxn.  Marland Kitchen Hx of colonoscopy    2009 by Dr. Laural Golden. Normal except for small submucosal lipoma. No evidence of polyps or colitis.  . Hyperlipidemia   . Hypertension   . Hypogonadism in male 07/25/2015  . Ischemic cardiomyopathy    a. 07/2016 Echo: EF 55-60%; b. 02/2018 MV: EF 30%; c. 04/2018 Echo: EF 35%; d. 08/2018 Echo: EF 30-35%.  . OSA (obstructive sleep apnea)    No CPAP  . Permanent atrial fibrillation (H[facility])    a. CHA2DS2VASc = 3-->Coumadin (also mech AVR); b. 06/2018 Amio started 2/2 elevated rates.  . Renal cell cancer ([place])   . Renal cell carcinoma ([place]) 08/04/2016   Past Surgical History:  Procedure Laterality Date  . AORTIC VALVE REPLACEMENT  3 /11 /2014   At [facility]    . CAPD INSERTION N/A 02/19/2017   Procedure: LAPAROSCOPIC INSERTION CONTINUOUS AMBULATORY PERITONEAL DIALYSIS  (CAPD) CATHETER;  Surgeon: Algernon Huxley,  MD;  Location: [facility];  Service: General;  Laterality: N/A;  . CORONARY ANGIOPLASTY WITH STENT PLACEMENT    . CORONARY ARTERY BYPASS GRAFT N/A 09/16/2018   Procedure: CORONARY ARTERY BYPASS GRAFT times three      with left internal mammary artery and endoharvest of right leg greater saphenous vein;  Surgeon: Melrose Nakayama, MD;  Location: [place];  Service: Open Heart Surgery;  Laterality: N/A;  . CORONARY STENT INTERVENTION N/A 07/15/2019   Procedure: CORONARY STENT INTERVENTION;  Surgeon: Wellington Hampshire, MD;  Location: [place] [facility];  Service: Cardiovascular;  Laterality: N/A;  . DG AV DIALYSIS  SHUNT ACCESS EXIST*L*  OR     left arm  . EPIGASTRIC HERNIA REPAIR N/A 02/19/2017   Procedure: HERNIA REPAIR EPIGASTRIC ADULT;  Surgeon: Robert Bellow, MD;  Location: [facility];  Service: General;  Laterality: N/A;  . EYE SURGERY     bilateral cataract  . HERNIA REPAIR     UMBILICAL HERNIA  . HERNIA REPAIR  02/19/2017  . INNER EAR SURGERY     20 years ago  . KIDNEY TRANSPLANT    . LEFT HEART CATH AND CORS/GRAFTS ANGIOGRAPHY N/A 07/15/2019   Procedure: LEFT HEART CATH AND CORS/GRAFTS ANGIOGRAPHY;  Surgeon: Wellington Hampshire, MD;  Location: [place] CV LAB;  Service: Cardiovascular;  Laterality: N/A;  . LOWER EXTREMITY ANGIOGRAPHY Right 08/25/2019   Procedure: LOWER EXTREMITY ANGIOGRAPHY;  Surgeon: Algernon Huxley, MD;  Location: [place] CV LAB;  Service: Cardiovascular;  Laterality: Right;  . LOWER EXTREMITY ANGIOGRAPHY Left 09/29/2019   Procedure: Lower Extremity Angiography;  Surgeon: Algernon Huxley, MD;  Location: [place] CV LAB;  Service: Cardiovascular;  Laterality: Left;  . Peritoneal Dialysis access placement  02/19/2017  . PSEUDOANERYSM COMPRESSION N/A 09/29/2019   Procedure: PSEUDOANERYSM COMPRESSION;  Surgeon: Algernon Huxley, MD;  Location: [place] [facility];  Service: Cardiovascular;  Laterality: N/A;  . REMOVAL OF A DIALYSIS CATHETER N/A 11/23/2017   Procedure: REMOVAL OF A PERITONEAL DIALYSIS CATHETER;  Surgeon: Algernon Huxley, MD;  Location: [facility];  Service: Vascular;  Laterality: N/A;  . RIGHT/LEFT HEART CATH AND CORONARY ANGIOGRAPHY Bilateral 06/07/2018   Procedure: RIGHT/LEFT HEART CATH AND CORONARY ANGIOGRAPHY;  Surgeon: Wellington Hampshire, MD;  Location: [place] CV LAB;  Service: Cardiovascular;  Laterality: Bilateral;  . TEE WITHOUT CARDIOVERSION N/A 07/21/2016   Procedure: TRANSESOPHAGEAL ECHOCARDIOGRAM (TEE);  Surgeon: Wellington Hampshire, MD;  Location: [facility];  Service: Cardiovascular;  Laterality: N/A;  . TEE WITHOUT CARDIOVERSION N/A 09/16/2018   Procedure: TRANSESOPHAGEAL  ECHOCARDIOGRAM (TEE);  Surgeon: Melrose Nakayama, MD;  Location: [place];  Service: Open Heart Surgery;  Laterality: N/A;    Allergies  Allergies  Allergen Reactions  . Statins     Other reaction(s): Arthralgias (intolerance), Myalgias (intolerance)  Pt taking pravastatin   . Sertraline Rash    History of Present Illness    Jeremy [name] is a 64 y.o. 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 He was last seen 10/18/19.  2013 PCI/DES to LAD with nonobstructive RCA and LCx disease. Previously on PD but switched to HD 11/2017 secondary to ineffective fluid removal. Late 2019 noted significant worsening angina with Carlton Adam  02/2018 at [facility] high risk due to large defect which was minimally reversible and EF 30%. Echo 04/2018 LVEF 35%, mildly reduced RV systolic function, moderate biatrial enlargment, mild MR, mild pulmonary hypertension. R/LHC 06/2018 with severe two vessel CAD involving LAD and RCA not optimal for athrectomy and moderate pulmonary hypertension with normal CO. Placed on Amiodarone as HR difficult to control in setting of atrial fib. Underwent 3-vessel CABG June 2020 (LIMA to LAD, SVG to D1, SVG to PDA). Follow up echo 01/2019 LVEF 45-50%, moderate LVH, mildly reduced RV systolic function, severely dilated LA, 72mm St. Jude mechanical AV prosthesis with mean gradient within normal for prosthesis valve.    Admitted 03/2019 with COVID pneumonia treated with remdesivir, IV steroids, prednisone taper. Developed atrial fibrillation with RVR rates 110s-120s. Noted dyspnea post-COVID. Echo 05/24/19 LVEF reduced to 40-45%, mild LVH. Lopressor switched to Carvedilol. Amiodarone has been discontinued due to his age to  prevent adverse effects.   He reported chest pain during follow up 06/2019. Lexiscan Myoview was high risk with large fixed defect inferior and inferolateral wall consistent with infarct as well as inferior ischemia and EF 27%.  He underwent cardiac catheterization 07/2019 which showed significant underlying two-vessel coronary artery disease with patent LIMA to LAD and occluded SVG to diagonal.  Diagonal was given collaterals from LAD.  SVG to right PDA was patent but had 99% ostial stenosis.  Underwent successful angioplasty and DES to SVG to RPDA.  He had small hematoma after procedure that did not require intervention.  His Coreg was switched to metoprolol for better rate control.  He was discharged home on Plavix but not aspirin as he is also on anticoagulation with warfarin.  He was seen in clinic 09/06/2019 by Dr. [name] and was doing well at that time.  He follows with vascular for nonhealing right foot ulcer.   He was admitted 09/29/2019 for lower extremity angiogram by vascular surgery with revascularization of the tibial vessels.  However had left groin hematoma and subsequently diagnosed a pseudoaneurysm.  09/29/19 underwent thrombin injection of left femoral artery pseudoaneurysm with ultrasound guidance however presented later that day to the ED with pain/swelling/numbness.  He was anemic with downtrending to 6.9.  Underwent repeat intervention of left femoral artery pseudoaneurysm stenting.  09/30/19 during HD developed atrial flutter with RVR along with fever with subsequent an infusion of 1 unit PRBC.  He had NSTEMI due to demand ischemia.  Echo while admission with LVEF 45-50%.  No Crestor secondary to myalgias (tolerates Pravastatin). Zetia added 04/27/19.   Seen in follow-up 10/18/2019 post hospital discharge.  Hemoglobin had returned 8.7 on monitoring by HD.  Heart rate was routinely 100 to 120 bpm.  No chest pain, pressure, tightness.  Metoprolol was increased to 75 mg for improved rate  control.  Tells me this weekend he had dyspnea on exertion and chest tightness with activity. No diaphoresis. Relieved by rest. Monday after dialysis he felt some better. They pulled off a normal amount of fluid. His nephrologist did not believe chest discomfort was related to volume overload per his report. Tells me he had some recurrent chest tightness on Tuesday.   Most recent Hb >10. He was recommended for home health post   Continues to take midodrine 10mg  prior to HD. Reports no lightheadedness, dizziness, near syncope.  EKGs/Labs/Other Studies Reviewed:   The following studies were reviewed today:  2D echo 10/01/2019: 1. Left ventricular ejection fraction, by estimation, is 45 to 50%. The  left ventricle has  mildly decreased function. The left ventricle  demonstrates regional wall motion abnormalities (see scoring  diagram/findings for description). There is moderate  left ventricular hypertrophy. Left ventricular diastolic parameters are  indeterminate. There is moderate hypokinesis of the left ventricular,  basal-mid inferior wall and inferolateral wall.   2. Right ventricular systolic function is normal. The right ventricular  size is normal. Tricuspid regurgitation signal is inadequate for assessing  PA pressure.   3. Left atrial size was moderately dilated.   4. Right atrial size was mildly dilated.   5. The mitral valve is normal in structure. No evidence of mitral valve  regurgitation. No evidence of mitral stenosis.   6. The aortic valve has been repaired/replaced. Aortic valve  regurgitation is not visualized. No aortic stenosis is present. There is a  St. Jude valve present in the aortic position. Aortic valve mean gradient  measures 11.0 mmHg.    EKG:  EKG is  ordered today.  The ekg ordered today demonstrates atrial flutter with variable block 98 bpm & RBBB  Recent Labs: 03/15/2019: B Natriuretic Peptide 1,821.0 10/03/2019: ALT 10 10/07/2019: BUN 54; Creatinine,  Ser 9.41; Hemoglobin 7.1; Magnesium 1.9; Platelets 157; Potassium 4.3; Sodium 139  Recent Lipid Panel    Component Value Date/Time   CHOL 136 06/21/2019 0932   CHOL 179 04/26/2019 0938   TRIG 150 (H) 06/21/2019 0932   HDL 33 (L) 06/21/2019 0932   HDL 38 (L) 04/26/2019 0938   CHOLHDL 4.1 06/21/2019 0932   VLDL 30 06/21/2019 0932   LDLCALC 73 06/21/2019 0932   LDLCALC 111 (H) 04/26/2019 0938   LDLDIRECT 61.0 08/11/2017 1447    Home Medications   Current Meds  Medication Sig  . acetaminophen (TYLENOL) 500 MG tablet Take 1,000 mg 2 (two) times daily as needed by mouth for moderate pain.  Marland Kitchen allopurinol (ZYLOPRIM) 300 MG tablet Take 300 mg by mouth daily.  . cholecalciferol (VITAMIN D3) 25 MCG (1000 UNIT) tablet Take 1,000 Units by mouth daily.  . cinacalcet (SENSIPAR) 30 MG tablet Take 30 mg by mouth every other day.   . clopidogrel (PLAVIX) 75 MG tablet Take 1 tablet (75 mg total) by mouth daily with breakfast.  . Epoetin Alfa-epbx (RETACRIT IJ) Epoetin alfa - epbx (Retacrit)  . ezetimibe (ZETIA) 10 MG tablet Take 1 tablet (10 mg total) by mouth daily.  . hydroxychloroquine (PLAQUENIL) 200 MG tablet Take 200 mg by mouth 2 (two) times daily.  Marland Kitchen lidocaine-prilocaine (EMLA) cream Apply 1 application topically as needed (for fistula). Monday, Wednesday, Friday for dialysis  . loratadine (CLARITIN) 10 MG tablet Take 10 mg by mouth daily.  . multivitamin (RENA-VIT) TABS tablet Take 1 tablet by mouth daily.  . nitroGLYCERIN (NITROSTAT) 0.4 MG SL tablet Place 1 tablet (0.4 mg total) under the tongue every 5 (five) minutes as needed for chest pain.  . pantoprazole (PROTONIX) 40 MG tablet Take 1 tablet (40 mg total) by mouth daily.  . rosuvastatin (CRESTOR) 40 MG tablet Take 1 tablet (40 mg total) by mouth daily at 6 PM.  . sevelamer carbonate (RENVELA) 800 MG tablet Take 4,000 mg by mouth 3 (three) times daily with meals. 5 tabs per meal   . traZODone (DESYREL) 50 MG tablet TAKE 1/2 -1 TABLET  (25-50 MG TOTAL) BY MOUTH AT BEDTIME AS NEEDED FOR SLEEP.  Marland Kitchen warfarin (COUMADIN) 1 MG tablet Take 1 mg by mouth daily.  Marland Kitchen zinc sulfate 220 (50 Zn) MG capsule Take 220 mg by mouth daily.  . [  DISCONTINUED] metoprolol tartrate 75 MG TABS Take 75 mg by mouth 2 (two) times daily.    Review of Systems      Review of Systems  Constitutional: Positive for malaise/fatigue. Negative for chills and fever.  Cardiovascular: Positive for chest pain and dyspnea on exertion. Negative for irregular heartbeat, leg swelling, near-syncope, orthopnea, palpitations and syncope.  Respiratory: Negative for shortness of breath and wheezing.   Gastrointestinal: Negative for melena, nausea and vomiting.  Genitourinary: Negative for hematuria.  Neurological: Negative for dizziness, light-headedness and weakness.   All other systems reviewed and are otherwise negative except as noted above.  Physical Exam    VS:  BP 120/70 (BP Location: Right Arm, Patient Position: Sitting, Cuff Size: Normal)   Pulse 98   Ht 5\' 9"  (1.753 m)   Wt 179 lb (81.2 kg)   SpO2 98%   BMI 26.43 kg/m  , BMI Body mass index is 26.43 kg/m. GEN: Well nourished, well developed, in no acute distress. HEENT: normal. Neck: Supple, no JVD, carotid bruits, or masses. Cardiac: RRR, valve click, no murmurs, rubs, or gallops. No clubbing, cyanosis, edema.  Radials/DP/PT 2+ and equal bilaterally.  Respiratory:  Respirations regular and unlabored, clear to auscultation bilaterally. GI: Soft, nontender, nondistended, BS + x 4. MS: No deformity or atrophy. Skin: Warm and dry, no rash. Neuro:  Strength and sensation are intact. Psych: Normal affect.  Assessment & Plan    1. CAD involving native coronary arteries s/p CABG s/p subsequent PCI to the SVG to RPDA - High risk Lexiscan 07/2019 with subsequent PCI DES to SVG-RPDA 07/15/2019. Continue beta-blocker and statin.  Continue clopidogrel without aspirin due to warfarin. Reports episodes over the last  week of chest tightness associated with dyspnea relieved by rest. EKG today with no acute ST/T wave changes. Anticipate some of his symptoms are related to severe underlying CAD and elevated heart rates with possible demand ischemia. Increased Metoprolol, as below. Plan for [employer].   2. Permanent atrial fibrillation with RVR/atrial flutter - Rate continues to be elevated despite increasing Metoprolol to 75mg  twice daily. Increase metoprolol to 100 mg twice daily.  Tachycardia and subsequent demand ischemia may be contributory to his chest tightness and dyspnea.  3. HFrEF secondary to ICM -Echo 10/01/2019 with LVEF 45-50% which is improved compared to previous. No ACE/ARB/vasodilator due to CKD and hypotension.  Continue beta-blocker.  His Coreg has had be switched to metoprolol for optimal heart rate control.  He is euvolemic on examination volume status is managed by dialysis.  4. PAD s/p recent intervention complicated by pseudoaneurysm s/p thrombin injection s/p stenting with blood loss anemia -Hemoglobin on discharge 7.1 consistent discharge with HD now 8.9.  Following closely with vascular surgery after recent pseudoaneurysm requiring repair and stenting.  Reports hematoma is decreasing in size.  Anticipate managing his ED, dyspnea is in the setting of recent hospitalization and anemia.  5. Severe AS s/p mechanical AVR-continue warfarin.  Echo 10/01/2019 with valve functioning appropriately.  Mean gradient 11 mmHg.  6. Hyperkalemia/ESRD -on HD 3 days/week.  Continue to follow nephrology.  7. HTN -BP well controlled continue present antihypertensive regimen. No recurrent hypotension with HD.  8. HLD, LDL goal less than 70 - 06/21/19 LDL 73.  Continue Zetia 10 mg daily and rosuvastatin 40 mg daily.  Disposition: Follow up after Lexiscan with Dr. [name] or APP   Loel Dubonnet, NP 11/25/2019, 7:54 AM

## 2019-11-24 ENCOUNTER — Other Ambulatory Visit: Payer: Medicare Other

## 2019-11-24 ENCOUNTER — Ambulatory Visit (INDEPENDENT_AMBULATORY_CARE_PROVIDER_SITE_OTHER): Payer: Medicare Other | Admitting: Family

## 2019-11-24 ENCOUNTER — Other Ambulatory Visit: Payer: Self-pay

## 2019-11-24 ENCOUNTER — Encounter: Payer: Self-pay | Admitting: Family

## 2019-11-24 ENCOUNTER — Ambulatory Visit
Admission: RE | Admit: 2019-11-24 | Discharge: 2019-11-24 | Disposition: A | Discharge status: Home / Self Care | Payer: Medicare Other | Source: Ambulatory Visit | Attending: Family Medicine | Admitting: Family Medicine

## 2019-11-24 ENCOUNTER — Ambulatory Visit
Admission: RE | Admit: 2019-11-24 | Discharge: 2019-11-24 | Disposition: A | Discharge status: Home / Self Care | Payer: Medicare Other | Attending: Family Medicine | Admitting: Family Medicine

## 2019-11-24 VITALS — BP 120/70 | HR 98 | Ht 69.0 in | Wt 179.0 lb

## 2019-11-24 DIAGNOSIS — R05 Cough: Secondary | ICD-10-CM | POA: Diagnosis present

## 2019-11-24 DIAGNOSIS — I25118 Atherosclerotic heart disease of native coronary artery with other forms of angina pectoris: Secondary | ICD-10-CM

## 2019-11-24 DIAGNOSIS — Z7901 Long term (current) use of anticoagulants: Secondary | ICD-10-CM

## 2019-11-24 DIAGNOSIS — I4811 Longstanding persistent atrial fibrillation: Secondary | ICD-10-CM

## 2019-11-24 DIAGNOSIS — E785 Hyperlipidemia, unspecified: Secondary | ICD-10-CM | POA: Diagnosis not present

## 2019-11-24 DIAGNOSIS — Z952 Presence of prosthetic heart valve: Secondary | ICD-10-CM

## 2019-11-24 DIAGNOSIS — R079 Chest pain, unspecified: Secondary | ICD-10-CM

## 2019-11-24 DIAGNOSIS — R053 Chronic cough: Secondary | ICD-10-CM

## 2019-11-24 MED ORDER — METOPROLOL TARTRATE 100 MG PO TABS: 100.0000 mg | ORAL_TABLET | Freq: Two times a day (BID) | ORAL | 3 refills | Status: DC

## 2019-11-24 NOTE — Patient Instructions (Addendum)
Medication Instructions:  Your physician has recommended you make the following change in your medication:  CHANGE Metoprolol tartrate to 100mg  twice daily *you may continue to hold on mornings of dialysis  *If you need a refill on your cardiac medications before your next appointment, please call your pharmacy*  Lab Work: No lab work today.  Testing/Procedures: Your physician has requested that you have a lexiscan myoview. Please follow instruction sheet, below.  Follow-Up: At [facility], you and your health needs are our priority.  As part of our continuing mission to provide you with exceptional heart care, we have created designated Provider Care Teams.  These Care Teams include your primary Cardiologist (physician) and Advanced Practice Providers (APPs -  Physician Assistants and Nurse Practitioners) who all work together to provide you with the care you need, when you need it.   Your next appointment:   After your Lexiscan    The format for your next appointment:   In Person  Provider:    You may see Kathlyn Sacramento, MD or one of the following Advanced Practice Providers on your designated Care Team:    Murray Hodgkins, NP  Christell Faith, PA-C  Marrianne Mood, PA-C  Laurann [place], NP  Other Instructions [place]  Your caregiver has ordered a Stress Test with nuclear imaging. The purpose of this test is to evaluate the blood supply to your heart muscle. This procedure is referred to as a "Non-Invasive Stress Test." This is because other than having an IV started in your vein, nothing is inserted or "invades" your body. Cardiac stress tests are done to find areas of poor blood flow to the heart by determining the extent of coronary artery disease (CAD). Some patients exercise on a treadmill, which naturally increases the blood flow to your heart, while others who are  unable to walk on a treadmill due to physical limitations have a pharmacologic/chemical stress  agent called Lexiscan . This medicine will mimic walking on a treadmill by temporarily increasing your coronary blood flow.   Please note: these test may take anywhere between 2-4 hours to complete  PLEASE REPORT TO [place] AT THE FIRST DESK WILL DIRECT YOU WHERE TO GO  Date of Procedure:_____________________________________  Arrival Time for Procedure:______________________________  Instructions regarding medication:   _XX___:  Hold Metoprolol the night before procedure and morning of procedure   PLEASE NOTIFY THE OFFICE AT LEAST 24 HOURS IN ADVANCE IF YOU ARE UNABLE TO KEEP YOUR APPOINTMENT.  [phone] AND  PLEASE NOTIFY NUCLEAR MEDICINE AT [facility] AT LEAST 24 HOURS IN ADVANCE IF YOU ARE UNABLE TO KEEP YOUR APPOINTMENT. [phone]  How to prepare for your Myoview test:  1. Do not eat or drink after midnight 2. No caffeine for 24 hours prior to test 3. No smoking 24 hours prior to test. 4. Your medication may be taken with water.  If your doctor stopped a medication because of this test, do not take that medication. 5. Ladies, please do not wear dresses.  Skirts or pants are appropriate. Please wear a short sleeve shirt. 6. No perfume, cologne or lotion. 7. Wear comfortable walking shoes. No heels!

## 2019-11-27 ENCOUNTER — Encounter: Payer: Self-pay | Admitting: Family

## 2019-11-28 ENCOUNTER — Encounter: Payer: Self-pay | Admitting: Cardiovascular Disease

## 2019-11-28 ENCOUNTER — Ambulatory Visit (INDEPENDENT_AMBULATORY_CARE_PROVIDER_SITE_OTHER): Payer: Medicare Other

## 2019-11-28 DIAGNOSIS — I4811 Longstanding persistent atrial fibrillation: Secondary | ICD-10-CM | POA: Diagnosis not present

## 2019-11-28 DIAGNOSIS — Z952 Presence of prosthetic heart valve: Secondary | ICD-10-CM

## 2019-11-28 DIAGNOSIS — Z5181 Encounter for therapeutic drug level monitoring: Secondary | ICD-10-CM | POA: Diagnosis not present

## 2019-11-28 LAB — POCT INR: INR: 3.4 — AB (ref 2.0–3.0)

## 2019-11-28 NOTE — Patient Instructions (Signed)
Sent MyChart message to pt and advised him to : - continue to take 1 tablet of warfarin daily.  - Recheck INR in 1 week on Monday

## 2019-11-28 NOTE — Telephone Encounter (Signed)
Dr. Kasa, please advise. Thanks °

## 2019-11-29 ENCOUNTER — Other Ambulatory Visit: Payer: Self-pay

## 2019-11-29 ENCOUNTER — Encounter
Admission: RE | Admit: 2019-11-29 | Discharge: 2019-11-29 | Disposition: A | Discharge status: Home / Self Care | Payer: Medicare Other | Source: Ambulatory Visit | Attending: Family | Admitting: Family

## 2019-11-29 DIAGNOSIS — R079 Chest pain, unspecified: Secondary | ICD-10-CM | POA: Insufficient documentation

## 2019-11-29 LAB — NM MYOCAR MULTI W/SPECT W/WALL MOTION / EF
LV dias vol: 199 mL (ref 62–150)
LV sys vol: 125 mL
MPHR: 158 {beats}/min
Peak HR: 109 {beats}/min
Percent HR: 68 %
Rest HR: 94 {beats}/min
SDS: 3
TID: 1.06

## 2019-11-29 MED ORDER — TECHNETIUM TC 99M TETROFOSMIN IV KIT
31.9000 | PACK | Freq: Once | INTRAVENOUS | Status: AC | PRN
  Administered 2019-11-29: 31.9 via INTRAVENOUS

## 2019-11-29 MED ORDER — REGADENOSON 0.4 MG/5ML IV SOLN
0.4000 mg | Freq: Once | INTRAVENOUS | Status: AC
  Administered 2019-11-29: 0.4 mg via INTRAVENOUS

## 2019-11-29 MED ORDER — TECHNETIUM TC 99M TETROFOSMIN IV KIT
10.0000 | PACK | Freq: Once | INTRAVENOUS | Status: AC | PRN
  Administered 2019-11-29: 10.08 via INTRAVENOUS

## 2019-11-30 ENCOUNTER — Telehealth: Payer: Self-pay

## 2019-11-30 NOTE — Telephone Encounter (Signed)
-----   Message from Loel Dubonnet, NP sent at 11/30/2019  7:48 AM EDT ----- Stress test shows prior scar and known low EF (ejection fraction or pumping function). We can see where the stent was placed in April that there is improved blood flow. No significant ischemia (blockage) which is a good result! Reassuring study. No evidence of new blockages.

## 2019-11-30 NOTE — Telephone Encounter (Signed)
Call to patient to review stress test results.    Pt verbalized understanding and has no further questions at this time.    Advised pt to call for any further questions or concerns.  No further orders.  Confirmed appt 9/9.

## 2019-12-01 ENCOUNTER — Ambulatory Visit: Payer: Medicare Other | Admitting: Family

## 2019-12-05 ENCOUNTER — Ambulatory Visit (INDEPENDENT_AMBULATORY_CARE_PROVIDER_SITE_OTHER): Payer: Medicare Other

## 2019-12-05 ENCOUNTER — Telehealth: Payer: Self-pay

## 2019-12-05 ENCOUNTER — Encounter: Payer: Self-pay | Admitting: Cardiovascular Disease

## 2019-12-05 DIAGNOSIS — Z5181 Encounter for therapeutic drug level monitoring: Secondary | ICD-10-CM | POA: Diagnosis not present

## 2019-12-05 DIAGNOSIS — I4811 Longstanding persistent atrial fibrillation: Secondary | ICD-10-CM

## 2019-12-05 DIAGNOSIS — Z952 Presence of prosthetic heart valve: Secondary | ICD-10-CM | POA: Diagnosis not present

## 2019-12-05 LAB — POCT INR: INR: 2.6 (ref 2.0–3.0)

## 2019-12-05 NOTE — Patient Instructions (Signed)
Sent [place] message to pt and advised him to : - continue to take 1 tablet of warfarin daily.  - Recheck INR in 1 week

## 2019-12-05 NOTE — Telephone Encounter (Signed)
Please see anti-coag note for today. 

## 2019-12-05 NOTE — Telephone Encounter (Signed)
Patient calling with INR reading of 2.6

## 2019-12-06 ENCOUNTER — Other Ambulatory Visit: Payer: Self-pay

## 2019-12-06 ENCOUNTER — Other Ambulatory Visit
Admission: RE | Admit: 2019-12-06 | Discharge: 2019-12-06 | Disposition: A | Discharge status: Home / Self Care | Payer: Medicare Other | Attending: Primary Care | Admitting: Primary Care

## 2019-12-06 ENCOUNTER — Ambulatory Visit (INDEPENDENT_AMBULATORY_CARE_PROVIDER_SITE_OTHER): Payer: Medicare Other | Admitting: Primary Care

## 2019-12-06 ENCOUNTER — Encounter: Payer: Self-pay | Admitting: Primary Care

## 2019-12-06 VITALS — BP 114/62 | HR 109 | Temp 97.3°F | Ht 69.0 in | Wt 175.0 lb

## 2019-12-06 DIAGNOSIS — J449 Chronic obstructive pulmonary disease, unspecified: Secondary | ICD-10-CM

## 2019-12-06 DIAGNOSIS — R05 Cough: Secondary | ICD-10-CM

## 2019-12-06 DIAGNOSIS — R0602 Shortness of breath: Secondary | ICD-10-CM

## 2019-12-06 DIAGNOSIS — R059 Cough, unspecified: Secondary | ICD-10-CM | POA: Insufficient documentation

## 2019-12-06 DIAGNOSIS — J849 Interstitial pulmonary disease, unspecified: Secondary | ICD-10-CM | POA: Insufficient documentation

## 2019-12-06 DIAGNOSIS — G4733 Obstructive sleep apnea (adult) (pediatric): Secondary | ICD-10-CM

## 2019-12-06 MED ORDER — BENZONATATE 200 MG PO CAPS: 200.0000 mg | ORAL_CAPSULE | Freq: Three times a day (TID) | ORAL | 1 refills | Status: DC | PRN

## 2019-12-06 MED ORDER — AZELASTINE HCL 0.1 % NA SOLN: 1.0000 | Freq: Two times a day (BID) | NASAL | 0 refills | Status: DC

## 2019-12-06 MED ORDER — PREDNISONE 10 MG PO TABS: ORAL_TABLET | ORAL | 0 refills | Status: DC

## 2019-12-06 NOTE — Assessment & Plan Note (Signed)
-   Patient reports 100% compliance and benefit from CPAP use - Unable to access SD card today as it does not fit in our reader - Patient needs new CPAP machine, DME company is [employer]

## 2019-12-06 NOTE — Assessment & Plan Note (Addendum)
-   Cough is likely related to either upper airway cough syndrome or from ILD - Continue Protonix 40mg  daily  - Start Astelin nasal spray twice daily for PND symptoms, take tessalon perles three times a day for cough and Prednsione 20mg  x 5 days  - Needs PFTs to assess lung function and diffusion capacity; checking Alpha-1 level (evidence of emphysema on CXR)

## 2019-12-06 NOTE — Progress Notes (Signed)
@Patient  ID: Jeremy [name], male    DOB: 11/15/1955, 64 y.o.   MRN: [id number]  Chief Complaint  Patient presents with  . Acute Visit    Cough since July 2021- non prod.     Referring provider: Leone Haven, MD  HPI: 64 year old male, never smoked.  Past medical history significant for interstitial lung disease, OSA, A. fib, coronary artery disease, aortic valve disorder (mechanical aortic valve on Coumadin therapy), hypertension, heart failure, NSTEMI, liver disease, end-stage renal disease (on dialysis), renal transplant failure and rejection.  Patient of Dr. Mortimer Fries, last seen December 2019.  CT chest showed some lower lobe predominant interstitial lung disease.  When compared to previous CT scan of abdomen pelvis there were some subtle changes.  12/06/2019 Presents today for regular follow-up.  He was last seen in December 2019.  Patient reports symptoms of chronic cough.  He denies any shortness of breath. Patient is overdue for repeat CT chest to monitor ILD. He has had a dry cough since beginning of July. He developed this while in the hospital for pseudoaneurysm, thigh hematoma. He has mild PND but this is not new. Denies current reflux symptoms or heartburn, he is on Protonix 40mg  daily. He has been taking robitussin and Theraflu. CXR in August showed chronic vascular congestion without edema, emphysema. He has never smoked. His step-father did smoke. No family hx of COPD that he is aware of.  He is on 5mg  prednisone and Plaquenil for osteoarthritis per rheumatology. He had covid December 2020, he was hospital for 5 days and received resmdesivir.   He has had CPAP machine since 2011. He reports 100% compliance with use, states that it is hard to sleep without it. Stopped working 1 week ago. Continues dialysis three times a week. His weight is down 15-20 lbs.    Imaging: 03/02/2019 CT chest without contrast - signs of suspicious mass or nodule.  No signs of consolidation or pleural  effusion.  Airways patent.  12/15/2017 HRCT- patchy subpleural and perilobular reticulation and groundglass attenuation in both lungs with dependent basilar predominance.  No significant regions of traction bronchiectasis, architectural distortion or frank honeycombing.  These findings are largely new since 07/18/2016 CT chest.  Mild patchy air trapping in both lungs.   Allergies  Allergen Reactions  . Statins     Other reaction(s): Arthralgias (intolerance), Myalgias (intolerance)  Pt taking pravastatin   . Sertraline Rash    Immunization History  Administered Date(s) Administered  . Hepatitis B, adult 11/29/2015, 12/29/2015, 10/13/2016, 11/12/2016, 12/24/2016, 04/30/2017, 07/16/2018, 08/11/2018, 09/10/2018, 12/08/2018  . Influenza,inj,Quad PF,6+ Mos 01/03/2019  . Influenza,inj,quad, With Preservative 01/04/2018  . Influenza-Unspecified 04/20/2013, 01/09/2014, 01/04/2015, 12/25/2015  . PFIZER SARS-COV-2 Vaccination 06/17/2019  . Pneumococcal Conjugate-13 03/25/2017  . Pneumococcal Polysaccharide-23 01/01/2016  . Td 04/20/2006    Past Medical History:  Diagnosis Date  . Abnormal stress test 03/19/2018  . 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 Aortic stenosis    a. 2014 s/p mech AVR ([facility])-->chronic coumadin; b. 08/2018 Echo: Mild AS/AI. Nl fxn/gradient.  . Arthritis   . Atrial fibrillation ([place])   . CAD (coronary artery disease)    a. 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 02/2018 MV [facility]): large inf, infsept, inflat infarct w/ min rev, EF 30%; c. 06/2018 Cath: LM min irregs, LAD 60p, 95/30/68m, LCX min irregs, RCA 40p, 64m, 90d, RPAV 90, RPDA 95ost; d. 09/2018 CABGx3 (LIMA->LAD,  VG->D1, VG->PDA).  . Chronic anticoagulation 06/2012   a. Coumadin in the setting of mech AVR and afib.  Marland Kitchen COVID-19 03/15/2019  . CPAP (continuous positive airway pressure) dependence   . Dialysis patient [facility]) 04/07/2006   Patient  started HD around 1996,  had 1st transplant LLQ around 1998 until 2007 when they found renal cell cancer in transplant and both native kidneys, all were removed > went back on HD until 2nd transplant RLQ in 2014 which lasted 3 yrs; it was embolized for suspected acute rejection. Is currently on HD MWF in [place] w/ [facility] nephrology.  . ESRD (end stage renal disease) ([place])    a. 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 1999 s/p Kidney transplant; c. 2014 Bilat nephrectomy (transplanted kidney resected) in the setting of renal cell carcinoma; d. PD until 11/2017-->HD.  Marland Kitchen Heart murmur   . HFrEF (heart failure with reduced ejection fraction) ([place])    a. 07/2016 Echo: EF 55-60%; b. 02/2018 MV: EF 30%; c. 04/2018 Echo: EF 35%; d. 08/2018 Echo: EF 30-35%. Glob HK w/o rwma. Mildly reduced RV fxn. Mod to sev dil LA. Nl AoV fxn.  Marland Kitchen Hx of colonoscopy    2009 by Dr. Laural Golden. Normal except for small submucosal lipoma. No evidence of polyps or colitis.  . Hyperlipidemia   . Hypertension   . Hypogonadism in male 07/25/2015  . Ischemic cardiomyopathy    a. 07/2016 Echo: EF 55-60%; b. 02/2018 MV: EF 30%; c. 04/2018 Echo: EF 35%; d. 08/2018 Echo: EF 30-35%.  . OSA (obstructive sleep apnea)    No CPAP  . Permanent atrial fibrillation ([facility])    a. CHA2DS2VASc = 3-->Coumadin (also mech AVR); b. 06/2018 Amio started 2/2 elevated rates.  . Renal cell cancer ([place])   . Renal cell carcinoma ([place]) 08/04/2016    Tobacco History: Social History   Tobacco Use  Smoking Status Never Smoker  Smokeless Tobacco Never Used   Counseling given: Not Answered   Outpatient Medications Prior to Visit  Medication Sig Dispense Refill  . acetaminophen (TYLENOL) 500 MG tablet Take 1,000 mg 2 (two) times daily as needed by mouth for moderate pain.    Marland Kitchen allopurinol (ZYLOPRIM) 300 MG tablet Take 300 mg by mouth daily.  5  . cholecalciferol (VITAMIN D3) 25 MCG (1000 UNIT) tablet Take 1,000 Units by mouth daily.    . cinacalcet  (SENSIPAR) 30 MG tablet Take 30 mg by mouth every other day.     . clopidogrel (PLAVIX) 75 MG tablet Take 1 tablet (75 mg total) by mouth daily with breakfast. 90 tablet 3  . Epoetin Alfa-epbx (RETACRIT IJ) Epoetin alfa - epbx (Retacrit)    . ezetimibe (ZETIA) 10 MG tablet Take 1 tablet (10 mg total) by mouth daily. 90 tablet 3  . hydroxychloroquine (PLAQUENIL) 200 MG tablet Take 200 mg by mouth 2 (two) times daily.    Marland Kitchen lidocaine-prilocaine (EMLA) cream Apply 1 application topically as needed (for fistula). Monday, Wednesday, Friday for dialysis    . loratadine (CLARITIN) 10 MG tablet Take 10 mg by mouth daily.    . metoprolol tartrate (LOPRESSOR) 100 MG tablet Take 1 tablet (100 mg total) by mouth 2 (two) times daily. Patient to call when refill needed. 180 tablet 3  . multivitamin (RENA-VIT) TABS tablet Take 1 tablet by mouth daily.    . pantoprazole (PROTONIX) 40 MG tablet Take 1 tablet (40 mg total) by mouth daily. 90 tablet 3  . rosuvastatin (CRESTOR) 40  MG tablet Take 1 tablet (40 mg total) by mouth daily at 6 PM. 90 tablet 3  . sevelamer carbonate (RENVELA) 800 MG tablet Take 4,000 mg by mouth 3 (three) times daily with meals. 5 tabs per meal     . traZODone (DESYREL) 50 MG tablet TAKE 1/2 -1 TABLET (25-50 MG TOTAL) BY MOUTH AT BEDTIME AS NEEDED FOR SLEEP. 90 tablet 2  . warfarin (COUMADIN) 1 MG tablet Take 1 mg by mouth daily.    Marland Kitchen zinc sulfate 220 (50 Zn) MG capsule Take 220 mg by mouth daily.    . nitroGLYCERIN (NITROSTAT) 0.4 MG SL tablet Place 1 tablet (0.4 mg total) under the tongue every 5 (five) minutes as needed for chest pain. 25 tablet 3   No facility-administered medications prior to visit.   Review of Systems  Review of Systems  Constitutional: Negative.   HENT: Positive for postnasal drip.   Respiratory: Positive for cough. Negative for chest tightness, shortness of breath and wheezing.     Physical Exam  BP 114/62 (BP Location: Left Arm, Cuff Size: Normal)   Pulse  (!) 109   Temp (!) 97.3 F (36.3 C) (Temporal)   Ht 5\' 9"  (1.753 m)   Wt 175 lb (79.4 kg)   SpO2 100% Comment: on RA  BMI 25.84 kg/m  Physical Exam Constitutional:      Appearance: Normal appearance.  Cardiovascular:     Rate and Rhythm: Normal rate and regular rhythm.  Pulmonary:     Effort: Pulmonary effort is normal.     Breath sounds: Normal breath sounds.     Comments: No overt rales Neurological:     General: No focal deficit present.     Mental Status: He is alert and oriented to person, place, and time. Mental status is at baseline.  Psychiatric:        Mood and Affect: Mood normal.        Behavior: Behavior normal.        Thought Content: Thought content normal.        Judgment: Judgment normal.      Lab Results:  CBC    Component Value Date/Time   WBC 5.8 10/07/2019 0532   RBC 2.21 (L) 10/07/2019 0532   HGB 7.1 (L) 10/07/2019 0532   HGB 10.0 (L) 08/30/2014 1013   HCT 22.6 (L) 10/07/2019 0532   HCT 32.2 (L) 08/30/2014 1013   PLT 157 10/07/2019 0532   PLT 125 (L) 08/30/2014 1013   MCV 102.3 (H) 10/07/2019 0532   MCV 88.2 09/16/2014 1041   MCV 89 08/30/2014 1013   MCH 32.1 10/07/2019 0532   MCHC 31.4 10/07/2019 0532   RDW 16.7 (H) 10/07/2019 0532   RDW 14.0 08/30/2014 1013   LYMPHSABS 1.2 10/03/2019 0348   LYMPHSABS 0.8 08/30/2014 1013   MONOABS 0.4 10/03/2019 0348   EOSABS 0.1 10/03/2019 0348   EOSABS 0.1 08/30/2014 1013   BASOSABS 0.0 10/03/2019 0348   BASOSABS 0.0 08/30/2014 1013    BMET    Component Value Date/Time   NA 139 10/07/2019 0532   NA 143 04/14/2019 0000   K 4.3 10/07/2019 0532   CL 94 (L) 10/07/2019 0532   CO2 30 10/07/2019 0532   GLUCOSE 90 10/07/2019 0532   BUN 54 (H) 10/07/2019 0532   BUN 20 04/14/2019 0000   CREATININE 9.41 (H) 10/07/2019 0532   CALCIUM 9.0 10/07/2019 0532   CALCIUM 9.2 12/14/2015 1059   GFRNONAA 5 (L) 10/07/2019 0532  GFRAA 6 (L) 10/07/2019 0532    BNP    Component Value Date/Time   BNP 1,821.0  (H) 03/15/2019 1448    ProBNP No results found for: PROBNP  Imaging: DG Chest 2 View  Result Date: 11/24/2019 CLINICAL DATA:  Chronic cough for 2 months, end-stage renal disease EXAM: CHEST - 2 VIEW COMPARISON:  10/03/2019 FINDINGS: Frontal and lateral views of the chest demonstrates stable postsurgical changes from median sternotomy and aortic valve replacement. Cardiac silhouette is enlarged but stable. Chronic central vascular congestion and background emphysema. No acute airspace disease, effusion, or pneumothorax. IMPRESSION: 1. Chronic vascular congestion without edema. 2. Emphysema.  No superimposed airspace disease. Electronically Signed   By: Randa Ngo M.D.   On: 11/24/2019 22:17   NM Myocar Multi W/Spect W/Wall Motion / EF  Result Date: 11/29/2019  Abnormal pharmacologic myocardial perfusion stress test.  There is a moderate in size, moderate in severity, fixed defect involving the anterolateral wall consistent with scar.  There is a large in size, moderate to severe, fixed inferior defect consistent with scar.  No significant ischemia is observed.  Left ventricular systolic function is severely reduced (LVEF 25%) with global hypokinesis and cardiomegaly.  Attenuation correction CT demonstrates post-CABG/AVR findings as well as coronary artery calcification and aortic atherosclercosis.  This is a high-risk study.  Compared with the prior study of 07/07/2019, anterior/lateral ischemia is no longer present.      Assessment & Plan:   ILD (interstitial lung disease) ([place]) - Patient has had  Dry cough since early July, he had covid in December 2020 - Over due for repeat high resolution CT to monitor mild fibrosis that was seen back in 2019  Cough - Cough is likely related to either upper airway cough syndrome or from ILD - Continue Protonix 40mg  daily  - Start Astelin nasal spray twice daily for PND symptoms, take tessalon perles three times a day for cough and Prednsione  20mg  x 5 days  - Needs PFTs to assess lung function and diffusion capacity; checking Alpha-1 level (evidence of emphysema on CXR)  OSA (obstructive sleep apnea) - Patient reports 100% compliance and benefit from CPAP use - Unable to access SD card today as it does not fit in our reader - Patient needs new CPAP machine, DME company is [employer], NP 12/06/2019

## 2019-12-06 NOTE — Patient Instructions (Addendum)
Cough: - Start Astelin nasal spray twice daily - Use tessalon perles three times a day for cough - Prednsione 20mg  x 5 days for cough then return to 5mg  daily and taper off per rheum  (make note if stronger dose of prednisone helps your cough at all) - Checking high resolution CT to monitor mild fibrosis that was seen back in 2019  OSA: - Continue to wear CPAP every night for minimum of 4 to 6 hours or more - Please bring SD card to durable medical equipment company to have them download compliance report and fax to our office  Orders: - Lab today (alpha-1 level) - HRCT re: ILD (ordered) - Pulmonary function testing (ordered) - Needs order for new CPAP machine   Follow-up: 6-8 weeks with Dr. Mortimer Fries to follow-up on results/cough

## 2019-12-06 NOTE — Assessment & Plan Note (Signed)
-   Patient has had  Dry cough since early July, he had covid in December 2020 - Over due for repeat high resolution CT to monitor mild fibrosis that was seen back in 2019

## 2019-12-08 LAB — ALPHA-1 ANTITRYPSIN PHENOTYPE: A-1 Antitrypsin, Ser: 173 mg/dL (ref 101–187)

## 2019-12-13 ENCOUNTER — Ambulatory Visit (INDEPENDENT_AMBULATORY_CARE_PROVIDER_SITE_OTHER): Payer: Medicare Other | Admitting: Vascular Surgery

## 2019-12-13 ENCOUNTER — Encounter (INDEPENDENT_AMBULATORY_CARE_PROVIDER_SITE_OTHER): Payer: Medicare Other

## 2019-12-14 ENCOUNTER — Telehealth: Payer: Self-pay

## 2019-12-14 ENCOUNTER — Ambulatory Visit (INDEPENDENT_AMBULATORY_CARE_PROVIDER_SITE_OTHER): Payer: Medicare Other

## 2019-12-14 DIAGNOSIS — Z5181 Encounter for therapeutic drug level monitoring: Secondary | ICD-10-CM | POA: Diagnosis not present

## 2019-12-14 DIAGNOSIS — Z952 Presence of prosthetic heart valve: Secondary | ICD-10-CM | POA: Diagnosis not present

## 2019-12-14 DIAGNOSIS — I4811 Longstanding persistent atrial fibrillation: Secondary | ICD-10-CM

## 2019-12-14 LAB — POCT INR: INR: 2.5 (ref 2.0–3.0)

## 2019-12-14 LAB — LAB REPORT - SCANNED
INR: 2.5
INR: 2.5

## 2019-12-14 NOTE — Telephone Encounter (Signed)
Patient calling in with INR reading of 2.5

## 2019-12-14 NOTE — Patient Instructions (Signed)
Sent MyChart message to pt and advised him to : - continue to take 1 tablet of warfarin daily.  - Recheck INR in 1 week on Monday

## 2019-12-14 NOTE — Telephone Encounter (Addendum)
Left message for pt w/ instructions that were also sent via MyChart.

## 2019-12-15 ENCOUNTER — Ambulatory Visit (INDEPENDENT_AMBULATORY_CARE_PROVIDER_SITE_OTHER): Payer: Medicare Other | Admitting: Family

## 2019-12-15 ENCOUNTER — Other Ambulatory Visit: Payer: Self-pay

## 2019-12-15 ENCOUNTER — Encounter: Payer: Self-pay | Admitting: Cardiovascular Disease

## 2019-12-15 ENCOUNTER — Encounter: Payer: Self-pay | Admitting: Family

## 2019-12-15 ENCOUNTER — Encounter: Payer: Self-pay | Admitting: Pharmacist

## 2019-12-15 VITALS — BP 100/65 | HR 95 | Ht 70.0 in | Wt 175.2 lb

## 2019-12-15 DIAGNOSIS — Z7901 Long term (current) use of anticoagulants: Secondary | ICD-10-CM

## 2019-12-15 DIAGNOSIS — I10 Essential (primary) hypertension: Secondary | ICD-10-CM

## 2019-12-15 DIAGNOSIS — I4811 Longstanding persistent atrial fibrillation: Secondary | ICD-10-CM

## 2019-12-15 DIAGNOSIS — I25118 Atherosclerotic heart disease of native coronary artery with other forms of angina pectoris: Secondary | ICD-10-CM | POA: Diagnosis not present

## 2019-12-15 DIAGNOSIS — I4821 Permanent atrial fibrillation: Secondary | ICD-10-CM

## 2019-12-15 DIAGNOSIS — Z952 Presence of prosthetic heart valve: Secondary | ICD-10-CM

## 2019-12-15 DIAGNOSIS — E785 Hyperlipidemia, unspecified: Secondary | ICD-10-CM

## 2019-12-15 DIAGNOSIS — I4891 Unspecified atrial fibrillation: Secondary | ICD-10-CM

## 2019-12-15 NOTE — Progress Notes (Signed)
Office Visit    Patient Name: Jeremy [name] Date of Encounter: 12/15/2019  Primary Care Provider:  Leone Haven, MD Primary Cardiologist:  Kathlyn Sacramento, MD Electrophysiologist:  None   Chief Complaint    Jeremy [name] is a 64 y.o. 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 Abnormal stress test 03/19/2018  . 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 Aortic stenosis    a. 2014 s/p mech AVR ([facility])-->chronic coumadin; b. 08/2018 Echo: Mild AS/AI. Nl fxn/gradient.  . Arthritis   . Atrial fibrillation ([place])   . CAD (coronary artery disease)    a. 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 02/2018 MV [facility]): large inf, infsept, inflat infarct w/ min rev, EF 30%; c. 06/2018 Cath: LM min irregs, LAD 60p, 95/30/51m, LCX min irregs, RCA 40p, 41m, 90d, RPAV 90, RPDA 95ost; d. 09/2018 CABGx3 (LIMA->LAD, VG->D1, VG->PDA).  . Chronic anticoagulation 06/2012   a. Coumadin in the setting of mech AVR and afib.  Marland Kitchen COVID-19 03/15/2019  . CPAP (continuous positive airway pressure) dependence   . Dialysis patient [facility]) 04/07/2006   Patient started HD around 1996,  had 1st transplant LLQ around 1998 until 2007 when they found renal cell cancer in transplant and both native kidneys, all were removed > went back on HD until 2nd transplant RLQ in 2014 which lasted 3 yrs; it was embolized for suspected acute rejection. Is currently on HD MWF in [place] w/ [facility] nephrology.  . ESRD (end stage renal disease) ([place])    a. CKD 2/2 to IgA nephropathy (dx age 47); b.  1999 s/p Kidney transplant; c. 2014 Bilat nephrectomy (transplanted kidney resected) in the setting of renal cell carcinoma; d. PD until 11/2017-->HD.  Marland Kitchen Heart murmur   . HFrEF (heart failure with reduced ejection fraction) ([place])    a. 07/2016 Echo: EF 55-60%; b. 02/2018 MV: EF 30%; c. 04/2018 Echo: EF 35%; d. 08/2018 Echo: EF 30-35%. Glob HK w/o rwma. Mildly reduced RV fxn. Mod to sev dil LA. Nl AoV fxn.  Marland Kitchen Hx of colonoscopy    2009 by Dr. Laural Golden. Normal except for small submucosal lipoma. No evidence of polyps or colitis.  . Hyperlipidemia   . Hypertension   . Hypogonadism in male 07/25/2015  . Ischemic cardiomyopathy    a. 07/2016 Echo: EF 55-60%; b. 02/2018 MV: EF 30%; c. 04/2018 Echo: EF 35%; d. 08/2018 Echo: EF 30-35%.  . OSA (obstructive sleep apnea)    No CPAP  . Permanent atrial fibrillation ([facility])    a. CHA2DS2VASc = 3-->Coumadin (also mech AVR); b. 06/2018 Amio started 2/2 elevated rates.  . Renal cell cancer ([place])   . Renal cell carcinoma ([place]) 08/04/2016   Past Surgical History:  Procedure Laterality Date  . AORTIC VALVE REPLACEMENT  3 /11 /2014   At [name]    . CAPD INSERTION N/A 02/19/2017   Procedure: LAPAROSCOPIC INSERTION CONTINUOUS AMBULATORY PERITONEAL DIALYSIS  (CAPD) CATHETER;  Surgeon: Algernon Huxley,  MD;  Location: [facility];  Service: General;  Laterality: N/A;  . CORONARY ANGIOPLASTY WITH STENT PLACEMENT    . CORONARY ARTERY BYPASS GRAFT N/A 09/16/2018   Procedure: CORONARY ARTERY BYPASS GRAFT times three      with left internal mammary artery and endoharvest of right leg greater saphenous vein;  Surgeon: Melrose Nakayama, MD;  Location: [place];  Service: Open Heart Surgery;  Laterality: N/A;  . CORONARY STENT INTERVENTION N/A 07/15/2019   Procedure: CORONARY STENT INTERVENTION;  Surgeon: Wellington Hampshire, MD;  Location: [place] [facility];  Service: Cardiovascular;  Laterality: N/A;  . DG AV DIALYSIS  SHUNT ACCESS EXIST*L*  OR     left arm  . EPIGASTRIC HERNIA REPAIR N/A 02/19/2017   Procedure: HERNIA REPAIR EPIGASTRIC ADULT;  Surgeon: Robert Bellow, MD;  Location: [facility];  Service: General;  Laterality: N/A;  . EYE SURGERY     bilateral cataract  . HERNIA REPAIR     UMBILICAL HERNIA  . HERNIA REPAIR  02/19/2017  . INNER EAR SURGERY     20 years ago  . KIDNEY TRANSPLANT    . LEFT HEART CATH AND CORS/GRAFTS ANGIOGRAPHY N/A 07/15/2019   Procedure: LEFT HEART CATH AND CORS/GRAFTS ANGIOGRAPHY;  Surgeon: Wellington Hampshire, MD;  Location: [place] [facility];  Service: Cardiovascular;  Laterality: N/A;  . LOWER EXTREMITY ANGIOGRAPHY Right 08/25/2019   Procedure: LOWER EXTREMITY ANGIOGRAPHY;  Surgeon: Algernon Huxley, MD;  Location: [place] [facility];  Service: Cardiovascular;  Laterality: Right;  . LOWER EXTREMITY ANGIOGRAPHY Left 09/29/2019   Procedure: Lower Extremity Angiography;  Surgeon: Algernon Huxley, MD;  Location: [place] [facility];  Service: Cardiovascular;  Laterality: Left;  . Peritoneal Dialysis access placement  02/19/2017  . PSEUDOANERYSM COMPRESSION N/A 09/29/2019   Procedure: PSEUDOANERYSM COMPRESSION;  Surgeon: Algernon Huxley, MD;  Location: [place] [facility];  Service: Cardiovascular;  Laterality: N/A;  . REMOVAL OF A DIALYSIS CATHETER N/A 11/23/2017   Procedure: REMOVAL OF A PERITONEAL DIALYSIS CATHETER;  Surgeon: Algernon Huxley, MD;  Location: [facility];  Service: Vascular;  Laterality: N/A;  . RIGHT/LEFT HEART CATH AND CORONARY ANGIOGRAPHY Bilateral 06/07/2018   Procedure: RIGHT/LEFT HEART CATH AND CORONARY ANGIOGRAPHY;  Surgeon: Wellington Hampshire, MD;  Location: [place] [facility];  Service: Cardiovascular;  Laterality: Bilateral;  . TEE WITHOUT CARDIOVERSION N/A 07/21/2016   Procedure: TRANSESOPHAGEAL ECHOCARDIOGRAM (TEE);  Surgeon: Wellington Hampshire, MD;  Location: [facility];  Service: Cardiovascular;  Laterality: N/A;  . TEE WITHOUT CARDIOVERSION N/A 09/16/2018   Procedure: TRANSESOPHAGEAL  ECHOCARDIOGRAM (TEE);  Surgeon: Melrose Nakayama, MD;  Location: [place];  Service: Open Heart Surgery;  Laterality: N/A;    Allergies  Allergies  Allergen Reactions  . Statins     Other reaction(s): Arthralgias (intolerance), Myalgias (intolerance)  Pt taking pravastatin   . Sertraline Rash    History of Present Illness    Jeremy [name] is a 65 y.o. 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 He was last seen 10/18/19.  2013 PCI/DES to LAD with nonobstructive RCA and LCx disease. Previously on PD but switched to HD 11/2017 secondary to ineffective fluid removal. Late 2019 noted significant worsening angina with Carlton Adam  02/2018 at [facility] high risk due to large defect which was minimally reversible and EF 30%. Echo 04/2018 LVEF 35%, mildly reduced RV systolic function, moderate biatrial enlargment, mild MR, mild pulmonary hypertension. R/LHC 06/2018 with severe two vessel CAD involving LAD and RCA not optimal for athrectomy and moderate pulmonary hypertension with normal CO. Placed on Amiodarone as HR difficult to control in setting of atrial fib. Underwent 3-vessel CABG June 2020 (LIMA to LAD, SVG to D1, SVG to PDA). Follow up echo 01/2019 LVEF 45-50%, moderate LVH, mildly reduced RV systolic function, severely dilated LA, 61mm St. Jude mechanical AV prosthesis with mean gradient within normal for prosthesis valve.    Admitted 03/2019 with COVID pneumonia treated with remdesivir, IV steroids, prednisone taper. Developed atrial fibrillation with RVR rates 110s-120s. Noted dyspnea post-COVID. Echo 05/24/19 LVEF reduced to 40-45%, mild LVH. Lopressor switched to Carvedilol. Amiodarone has been discontinued due to his age to  prevent adverse effects.   He reported chest pain during follow up 06/2019. Lexiscan Myoview was high risk with large fixed defect inferior and inferolateral wall consistent with infarct as well as inferior ischemia and EF 27%.  He underwent cardiac catheterization 07/2019 which showed significant underlying two-vessel coronary artery disease with patent LIMA to LAD and occluded SVG to diagonal.  Diagonal was given collaterals from LAD.  SVG to right PDA was patent but had 99% ostial stenosis.  Underwent successful angioplasty and DES to SVG to RPDA.  He had small hematoma after procedure that did not require intervention.  His Coreg was switched to metoprolol for better rate control.  He was discharged home on Plavix but not aspirin as he is also on anticoagulation with warfarin.  He was seen in clinic 09/06/2019 by Dr. [name] and was doing well at that time.  He follows with vascular for nonhealing right foot ulcer.   He was admitted 09/29/2019 for lower extremity angiogram by vascular surgery with revascularization of the tibial vessels.  However had left groin hematoma and subsequently diagnosed a pseudoaneurysm.  09/29/19 underwent thrombin injection of left femoral artery pseudoaneurysm with ultrasound guidance however presented later that day to the ED with pain/swelling/numbness.  He was anemic with downtrending to 6.9.  Underwent repeat intervention of left femoral artery pseudoaneurysm stenting.  09/30/19 during HD developed atrial flutter with RVR along with fever with subsequent an infusion of 1 unit PRBC.  He had NSTEMI due to demand ischemia.  Echo while admission with LVEF 45-50%.  No Crestor secondary to myalgias (tolerates Pravastatin). Zetia added 04/27/19.   Seen in follow-up 10/18/2019 post hospital discharge.  Hemoglobin had returned 8.7 on monitoring by HD.  Heart rate was routinely 100 to 120 bpm.  No chest pain, pressure, tightness.  Metoprolol was increased to 75 mg for improved rate  control.  Seen in clinic 11/24/19 with DOE and chest tightness. He was scheduled for stress test as he was very concerned about recurrent ischemia. His Metoprolol was increased to 100mg  due to tachycardia.   Stress test 11/29/19 with moderate in size, moderate in severity fixed defect of anterolateral wall consistent with scar and large in size, moderate to severe fixed inferior defect consistent with scar. No significant ischemia. EF of 25% though suspect his recent echo is more accurate representation of his EF. His previous anterior/lateral ischemia was no longer present.   Reports feeling overall well from cardiac perspective. He is having increasing claudication symptoms to his RLE with upcoming appt with Dr. Carlis Abbott. Reports no shortness of breath  nor dyspnea on exertion. Reports no chest pain, pressure, or tightness. No edema, orthopnea, PND. Reports no palpitations.   Continues to take midodrine 10mg  prior to HD. Reports no lightheadedness, dizziness, near syncope.  EKGs/Labs/Other Studies Reviewed:   The following studies were reviewed today:  2D echo 10/01/2019: 1. Left ventricular ejection fraction, by estimation, is 45 to 50%. The  left ventricle has mildly decreased function. The left ventricle  demonstrates regional wall motion abnormalities (see scoring  diagram/findings for description). There is moderate  left ventricular hypertrophy. Left ventricular diastolic parameters are  indeterminate. There is moderate hypokinesis of the left ventricular,  basal-mid inferior wall and inferolateral wall.   2. Right ventricular systolic function is normal. The right ventricular  size is normal. Tricuspid regurgitation signal is inadequate for assessing  PA pressure.   3. Left atrial size was moderately dilated.   4. Right atrial size was mildly dilated.   5. The mitral valve is normal in structure. No evidence of mitral valve  regurgitation. No evidence of mitral stenosis.   6. The  aortic valve has been repaired/replaced. Aortic valve  regurgitation is not visualized. No aortic stenosis is present. There is a  St. Jude valve present in the aortic position. Aortic valve mean gradient  measures 11.0 mmHg.    EKG:  EKG is  ordered today.  The ekg ordered today demonstrates atrial fib 95 bpm 95 bpm & RBBB with stable inferior T wave changes. No acute ST/T wave changes  Recent Labs: 03/15/2019: B Natriuretic Peptide 1,821.0 10/03/2019: ALT 10 10/07/2019: BUN 54; Creatinine, Ser 9.41; Hemoglobin 7.1; Magnesium 1.9; Platelets 157; Potassium 4.3; Sodium 139  Recent Lipid Panel    Component Value Date/Time   CHOL 136 06/21/2019 0932   CHOL 179 04/26/2019 0938   TRIG 150 (H) 06/21/2019 0932   HDL 33 (L) 06/21/2019 0932   HDL 38 (L) 04/26/2019 0938   CHOLHDL 4.1 06/21/2019 0932   VLDL 30 06/21/2019 0932   LDLCALC 73 06/21/2019 0932   LDLCALC 111 (H) 04/26/2019 0938   LDLDIRECT 61.0 08/11/2017 1447    Home Medications   Current Meds  Medication Sig  . acetaminophen (TYLENOL) 500 MG tablet Take 1,000 mg 2 (two) times daily as needed by mouth for moderate pain.  Marland Kitchen allopurinol (ZYLOPRIM) 300 MG tablet Take 300 mg by mouth daily.  Marland Kitchen azelastine (ASTELIN) 0.1 % nasal spray Place 1 spray into both nostrils 2 (two) times daily. Use in each nostril as directed  . benzonatate (TESSALON) 200 MG capsule Take 1 capsule (200 mg total) by mouth 3 (three) times daily as needed for cough.  . cholecalciferol (VITAMIN D3) 25 MCG (1000 UNIT) tablet Take 1,000 Units by mouth daily.  . cinacalcet (SENSIPAR) 30 MG tablet Take 30 mg by mouth every other day.   . clopidogrel (PLAVIX) 75 MG tablet Take 1 tablet (75 mg total) by mouth daily with breakfast.  . Epoetin Alfa-epbx (RETACRIT IJ) Epoetin alfa - epbx (Retacrit)  . hydroxychloroquine (PLAQUENIL) 200 MG tablet Take 200 mg by mouth 2 (two) times daily.  Marland Kitchen lidocaine-prilocaine (EMLA) cream Apply 1 application topically as needed (for  fistula). Monday, Wednesday, Friday for dialysis  . loratadine (CLARITIN) 10 MG tablet Take 10 mg by mouth daily.  . metoprolol tartrate (LOPRESSOR) 100 MG tablet Take 1 tablet (100 mg total) by mouth 2 (two) times daily. Patient to call when refill needed.  . multivitamin (RENA-VIT) TABS tablet Take 1 tablet by mouth daily.  . pantoprazole (  PROTONIX) 40 MG tablet Take 1 tablet (40 mg total) by mouth daily.  . predniSONE (DELTASONE) 1 MG tablet Take 4 mg by mouth daily with breakfast.  . rosuvastatin (CRESTOR) 40 MG tablet Take 1 tablet (40 mg total) by mouth daily at 6 PM.  . sevelamer carbonate (RENVELA) 800 MG tablet Take 4,000 mg by mouth 3 (three) times daily with meals. 5 tabs per meal   . traZODone (DESYREL) 50 MG tablet TAKE 1/2 -1 TABLET (25-50 MG TOTAL) BY MOUTH AT BEDTIME AS NEEDED FOR SLEEP.  Marland Kitchen warfarin (COUMADIN) 1 MG tablet Take 1 mg by mouth daily.  Marland Kitchen zinc sulfate 220 (50 Zn) MG capsule Take 220 mg by mouth daily.  . [DISCONTINUED] predniSONE (DELTASONE) 10 MG tablet Take 2 tabs x 5 days    Review of Systems      Review of Systems  Constitutional: Positive for malaise/fatigue. Negative for chills and fever.  Cardiovascular: Positive for claudication. Negative for chest pain, dyspnea on exertion, irregular heartbeat, leg swelling, near-syncope, orthopnea, palpitations and syncope.  Respiratory: Negative for shortness of breath and wheezing.   Gastrointestinal: Negative for melena, nausea and vomiting.  Genitourinary: Negative for hematuria.  Neurological: Negative for dizziness, light-headedness and weakness.   All other systems reviewed and are otherwise negative except as noted above.  Physical Exam    VS:  BP 100/65 (BP Location: Right Arm, Patient Position: Sitting, Cuff Size: Normal)   Pulse 95   Ht 5\' 10"  (1.778 m)   Wt 175 lb 3.2 oz (79.5 kg)   BMI 25.14 kg/m  , BMI Body mass index is 25.14 kg/m. GEN: Well nourished, well developed, in no acute distress. HEENT:  normal. Neck: Supple, no JVD, carotid bruits, or masses. Cardiac: RRR, valve click, no murmurs, rubs, or gallops. No clubbing, cyanosis, edema.  Radials/DP/PT 2+ and equal bilaterally.  Respiratory:  Respirations regular and unlabored, clear to auscultation bilaterally. GI: Soft, nontender, nondistended, BS + x 4. MS: No deformity or atrophy. Skin: Warm and dry, no rash. Neuro:  Strength and sensation are intact. Psych: Normal affect.  Assessment & Plan    1. CAD involving native coronary arteries s/p CABG s/p subsequent PCI to the SVG to RPDA - High risk Lexiscan 07/2019 with subsequent PCI DES to SVG-RPDA 07/15/2019. Lexiscan 11/2019 with no significant ischemia. No recurrent anginal symptoms. EKG without acute changes. Continue beta-blocker and statin.  Continue clopidogrel without aspirin due to warfarin.   2. Permanent atrial fibrillation with RVR/atrial flutter - Rate control improved after escalation of Metoprolol to 100mg .   3. HFrEF secondary to ICM -Echo 10/01/2019 with LVEF 45-50% which is improved compared to previous. No ACE/ARB/vasodilator due to CKD and hypotension.  Continue beta-blocker.  His Coreg has had be switched to metoprolol for optimal heart rate control.  He is euvolemic on examination volume status is managed by dialysis. Weight is stable.   4. PAD s/p recent intervention complicated by pseudoaneurysm s/p thrombin injection s/p stenting with blood loss anemia -Following closely with vascular surgery after recent pseudoaneurysm requiring repair and stenting.  Reports worsening weakness in his RLE. RLE DP/PT pulses 1+. Upcoming appointment with Dr. Carlis Abbott of [facility].   5. Severe AS s/p mechanical AVR-continue warfarin.  Echo 10/01/2019 with valve functioning appropriately.  Mean gradient 11 mmHg.  6. Hyperkalemia/ESRD -on HD 3 days/week.  Continue to follow nephrology.  7. HTN -BP well controlled continue present antihypertensive regimen. No recurrent hypotension with  HD.  8. HLD, LDL goal less than  70 - 06/21/19 LDL 73.  Continue Zetia 10 mg daily and rosuvastatin 40 mg daily.  Disposition: Follow up in November as previously scheduled with Dr. [name] or APP   Loel Dubonnet, NP 12/15/2019, 2:02 PM

## 2019-12-15 NOTE — Progress Notes (Signed)
This encounter was created in error - please disregard.

## 2019-12-15 NOTE — Patient Instructions (Addendum)
Medication Instructions:  No medication changes today.   *If you need a refill on your cardiac medications before your next appointment, please call your pharmacy*  Lab Work: No lab work today.   Testing/Procedures: Your EKG today was stable.  Your stress test showed no evidence of new blockages!  Follow-Up: At [facility], you and your health needs are our priority.  As part of our continuing mission to provide you with exceptional heart care, we have created designated Provider Care Teams.  These Care Teams include your primary Cardiologist (physician) and Advanced Practice Providers (APPs -  Physician Assistants and Nurse Practitioners) who all work together to provide you with the care you need, when you need it.  We recommend signing up for the patient portal called "MyChart".  Sign up information is provided on this After Visit Summary.  MyChart is used to connect with patients for Virtual Visits (Telemedicine).  Patients are able to view lab/test results, encounter notes, upcoming appointments, etc.  Non-urgent messages can be sent to your provider as well.   To learn more about what you can do with MyChart, go to [email or web address].    Your next appointment:   In December as previously scheduled with Dr. [name]

## 2019-12-20 ENCOUNTER — Ambulatory Visit (INDEPENDENT_AMBULATORY_CARE_PROVIDER_SITE_OTHER)
Admission: RE | Admit: 2019-12-20 | Discharge: 2019-12-20 | Disposition: A | Discharge status: Home / Self Care | Payer: Medicare Other | Source: Ambulatory Visit | Attending: Vascular Surgery | Admitting: Vascular Surgery

## 2019-12-20 ENCOUNTER — Other Ambulatory Visit: Payer: Medicare Other

## 2019-12-20 ENCOUNTER — Ambulatory Visit (INDEPENDENT_AMBULATORY_CARE_PROVIDER_SITE_OTHER): Payer: Medicare Other | Admitting: Vascular Surgery

## 2019-12-20 ENCOUNTER — Encounter: Payer: Self-pay | Admitting: Vascular Surgery

## 2019-12-20 ENCOUNTER — Other Ambulatory Visit: Payer: Self-pay

## 2019-12-20 ENCOUNTER — Encounter (HOSPITAL_COMMUNITY): Payer: Medicare Other

## 2019-12-20 VITALS — BP 125/85 | HR 105 | Temp 98.0°F | Ht 70.0 in | Wt 174.0 lb

## 2019-12-20 DIAGNOSIS — I739 Peripheral vascular disease, unspecified: Secondary | ICD-10-CM | POA: Diagnosis not present

## 2019-12-20 DIAGNOSIS — I7025 Atherosclerosis of native arteries of other extremities with ulceration: Secondary | ICD-10-CM

## 2019-12-20 LAB — LAB REPORT - SCANNED: INR: 2.9

## 2019-12-20 LAB — POCT INR: INR: 2.9 (ref 2.0–3.0)

## 2019-12-20 MED ORDER — SODIUM CHLORIDE 0.9 % IV SOLN: 250.0000 mL | INTRAVENOUS | Status: DC | PRN

## 2019-12-20 NOTE — Progress Notes (Signed)
Patient name: Jeremy [name] MRN: [id number] DOB: 1955/11/05 Sex: male  REASON FOR VISIT: 1 month follow-up right foot ulcer  HPI: Jeremy [name] is a 64 y.o. male with multiple medical problems including atrial fibrillation on Coumadin, peripheral arterial disease, coronary artery disease status post CABG and subsequent PCI, aortic stenosis with mechanical aortic valve, ESRD, hypertension, hyperlipidemia that presents for 1 month follow-up of right foot ulcer.  He was previously seen by Dr. Donnetta Hutching for a second opinion and then since Dr. Donnetta Hutching moved to [place] I was asked to follow his care here in [place].  He feels his right foot has gotten worse over the last month.  Now describes gangrene at the toe tip.  He has a complex recent history and had an area of nonhealing ulceration in his right foot and on 08/25/2019 underwent arteriography via the left femoral approach at [facility].  He had angioplasty of the tibioperoneal trunk, peroneal and posterior tibial arteries on the right with drug-eluting balloon.  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  Apparently had complication of bleeding follow this and was taken to the [facility] lab and underwent Viabahn stenting of his deep femoral artery, superficial femoral artery and common femoral artery for control of bleeding.    Past Medical History:  Diagnosis Date  . Abnormal stress test 03/19/2018  . 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 Aortic stenosis    a. 2014 s/p mech AVR ([facility])-->chronic coumadin; b. 08/2018 Echo: Mild AS/AI. Nl fxn/gradient.  . Arthritis   . Atrial fibrillation ([place])   . CAD (coronary artery disease)    a. 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 02/2018 MV [facility]): large inf, infsept, inflat infarct w/ min rev, EF 30%; c. 06/2018 Cath: LM min irregs, LAD 60p, 95/30/82m, LCX min irregs, RCA 40p, 68m, 90d, RPAV  90, RPDA 95ost; d. 09/2018 CABGx3 (LIMA->LAD, VG->D1, VG->PDA).  . Chronic anticoagulation 06/2012   a. Coumadin in the setting of mech AVR and afib.  Marland Kitchen COVID-19 03/15/2019  . CPAP (continuous positive airway pressure) dependence   . Dialysis patient [facility]) 04/07/2006   Patient started HD around 1996,  had 1st transplant LLQ around 1998 until 2007 when they found renal cell cancer in transplant and both native kidneys, all were removed > went back on HD until 2nd transplant RLQ in 2014 which lasted 3 yrs; it was embolized for suspected acute rejection. Is currently on HD MWF in [place] w/ [facility] nephrology.  . ESRD (end stage renal disease) ([place])    a. 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 1999 s/p Kidney transplant; c. 2014 Bilat nephrectomy (transplanted kidney resected) in the setting of renal cell carcinoma; d. PD until 11/2017-->HD.  Marland Kitchen Heart murmur   . HFrEF (heart failure with reduced ejection fraction) ([place])    a. 07/2016 Echo: EF 55-60%; b. 02/2018 MV: EF 30%; c. 04/2018 Echo: EF 35%; d. 08/2018 Echo: EF 30-35%. Glob HK w/o rwma. Mildly reduced RV fxn. Mod to sev dil LA. Nl AoV fxn.  Marland Kitchen Hx of colonoscopy    2009 by Dr. Laural Golden. Normal except for small submucosal lipoma. No evidence of polyps or colitis.  . Hyperlipidemia   . Hypertension   . Hypogonadism in male 07/25/2015  . Ischemic cardiomyopathy    a. 07/2016 Echo: EF 55-60%; b. 02/2018 MV: EF 30%; c.  04/2018 Echo: EF 35%; d. 08/2018 Echo: EF 30-35%.  . OSA (obstructive sleep apnea)    No CPAP  . Permanent atrial fibrillation ([facility])    a. CHA2DS2VASc = 3-->Coumadin (also mech AVR); b. 06/2018 Amio started 2/2 elevated rates.  . Renal cell cancer ([place])   . Renal cell carcinoma ([place]) 08/04/2016    Past Surgical History:  Procedure Laterality Date  . AORTIC VALVE REPLACEMENT  3 /11 /2014   At [place]    . CAPD INSERTION N/A 02/19/2017   Procedure: LAPAROSCOPIC INSERTION CONTINUOUS  AMBULATORY PERITONEAL DIALYSIS  (CAPD) CATHETER;  Surgeon: Algernon Huxley, MD;  Location: [facility] ORS;  Service: General;  Laterality: N/A;  . CORONARY ANGIOPLASTY WITH STENT PLACEMENT    . CORONARY ARTERY BYPASS GRAFT N/A 09/16/2018   Procedure: CORONARY ARTERY BYPASS GRAFT times three      with left internal mammary artery and endoharvest of right leg greater saphenous vein;  Surgeon: Melrose Nakayama, MD;  Location: [place];  Service: Open Heart Surgery;  Laterality: N/A;  . CORONARY STENT INTERVENTION N/A 07/15/2019   Procedure: CORONARY STENT INTERVENTION;  Surgeon: Wellington Hampshire, MD;  Location: [place] [facility];  Service: Cardiovascular;  Laterality: N/A;  . DG AV DIALYSIS  SHUNT ACCESS EXIST*L* OR     left arm  . EPIGASTRIC HERNIA REPAIR N/A 02/19/2017   Procedure: HERNIA REPAIR EPIGASTRIC ADULT;  Surgeon: Robert Bellow, MD;  Location: [facility] ORS;  Service: General;  Laterality: N/A;  . EYE SURGERY     bilateral cataract  . HERNIA REPAIR     UMBILICAL HERNIA  . HERNIA REPAIR  02/19/2017  . INNER EAR SURGERY     20 years ago  . KIDNEY TRANSPLANT    . LEFT HEART CATH AND CORS/GRAFTS ANGIOGRAPHY N/A 07/15/2019   Procedure: LEFT HEART CATH AND CORS/GRAFTS ANGIOGRAPHY;  Surgeon: Wellington Hampshire, MD;  Location: [place] [facility];  Service: Cardiovascular;  Laterality: N/A;  . LOWER EXTREMITY ANGIOGRAPHY Right 08/25/2019   Procedure: LOWER EXTREMITY ANGIOGRAPHY;  Surgeon: Algernon Huxley, MD;  Location: [place] [facility];  Service: Cardiovascular;  Laterality: Right;  . LOWER EXTREMITY ANGIOGRAPHY Left 09/29/2019   Procedure: Lower Extremity Angiography;  Surgeon: Algernon Huxley, MD;  Location: [place] [facility];  Service: Cardiovascular;  Laterality: Left;  . Peritoneal Dialysis access placement  02/19/2017  . PSEUDOANERYSM COMPRESSION N/A 09/29/2019   Procedure: PSEUDOANERYSM COMPRESSION;  Surgeon: Algernon Huxley, MD;  Location: [place] [facility];  Service: Cardiovascular;   Laterality: N/A;  . REMOVAL OF A DIALYSIS CATHETER N/A 11/23/2017   Procedure: REMOVAL OF A PERITONEAL DIALYSIS CATHETER;  Surgeon: Algernon Huxley, MD;  Location: [facility];  Service: Vascular;  Laterality: N/A;  . RIGHT/LEFT HEART CATH AND CORONARY ANGIOGRAPHY Bilateral 06/07/2018   Procedure: RIGHT/LEFT HEART CATH AND CORONARY ANGIOGRAPHY;  Surgeon: Wellington Hampshire, MD;  Location: [place] [facility];  Service: Cardiovascular;  Laterality: Bilateral;  . TEE WITHOUT CARDIOVERSION N/A 07/21/2016   Procedure: TRANSESOPHAGEAL ECHOCARDIOGRAM (TEE);  Surgeon: Wellington Hampshire, MD;  Location: [facility];  Service: Cardiovascular;  Laterality: N/A;  . TEE WITHOUT CARDIOVERSION N/A 09/16/2018   Procedure: TRANSESOPHAGEAL ECHOCARDIOGRAM (TEE);  Surgeon: Melrose Nakayama, MD;  Location: [place];  Service: Open Heart Surgery;  Laterality: N/A;    Family History  Problem Relation Age of Onset  . Cancer Mother        pancreatic  .  Heart disease Father   . Diabetes Father     SOCIAL HISTORY: Social History   Tobacco Use  . Smoking status: Never Smoker  . Smokeless tobacco: Never Used  Substance Use Topics  . Alcohol use: No    Allergies  Allergen Reactions  . Statins     Other reaction(s): Arthralgias (intolerance), Myalgias (intolerance)  Pt taking pravastatin   . Sertraline Rash    Current Outpatient Medications  Medication Sig Dispense Refill  . acetaminophen (TYLENOL) 500 MG tablet Take 1,000 mg 2 (two) times daily as needed by mouth for moderate pain.    Marland Kitchen allopurinol (ZYLOPRIM) 300 MG tablet Take 300 mg by mouth daily.  5  . azelastine (ASTELIN) 0.1 % nasal spray Place 1 spray into both nostrils 2 (two) times daily. Use in each nostril as directed 30 mL 0  . benzonatate (TESSALON) 200 MG capsule Take 1 capsule (200 mg total) by mouth 3 (three) times daily as needed for cough. 30 capsule 1  . cholecalciferol (VITAMIN D3) 25 MCG (1000 UNIT) tablet Take 1,000 Units by mouth daily.    .  cinacalcet (SENSIPAR) 30 MG tablet Take 30 mg by mouth every other day.     . clopidogrel (PLAVIX) 75 MG tablet Take 1 tablet (75 mg total) by mouth daily with breakfast. 90 tablet 3  . Epoetin Alfa-epbx (RETACRIT IJ) Epoetin alfa - epbx (Retacrit)    . ezetimibe (ZETIA) 10 MG tablet Take 1 tablet (10 mg total) by mouth daily. 90 tablet 3  . hydroxychloroquine (PLAQUENIL) 200 MG tablet Take 200 mg by mouth 2 (two) times daily.    Marland Kitchen lidocaine-prilocaine (EMLA) cream Apply 1 application topically as needed (for fistula). Monday, Wednesday, Friday for dialysis    . loratadine (CLARITIN) 10 MG tablet Take 10 mg by mouth daily.    . metoprolol tartrate (LOPRESSOR) 100 MG tablet Take 1 tablet (100 mg total) by mouth 2 (two) times daily. Patient to call when refill needed. 180 tablet 3  . multivitamin (RENA-VIT) TABS tablet Take 1 tablet by mouth daily.    . nitroGLYCERIN (NITROSTAT) 0.4 MG SL tablet Place 1 tablet (0.4 mg total) under the tongue every 5 (five) minutes as needed for chest pain. 25 tablet 3  . pantoprazole (PROTONIX) 40 MG tablet Take 1 tablet (40 mg total) by mouth daily. 90 tablet 3  . predniSONE (DELTASONE) 1 MG tablet Take 4 mg by mouth daily with breakfast.    . rosuvastatin (CRESTOR) 40 MG tablet Take 1 tablet (40 mg total) by mouth daily at 6 PM. 90 tablet 3  . sevelamer carbonate (RENVELA) 800 MG tablet Take 4,000 mg by mouth 3 (three) times daily with meals. 5 tabs per meal     . traZODone (DESYREL) 50 MG tablet TAKE 1/2 -1 TABLET (25-50 MG TOTAL) BY MOUTH AT BEDTIME AS NEEDED FOR SLEEP. 90 tablet 2  . warfarin (COUMADIN) 1 MG tablet Take 1 mg by mouth daily.    Marland Kitchen zinc sulfate 220 (50 Zn) MG capsule Take 220 mg by mouth daily.     No current facility-administered medications for this visit.    REVIEW OF SYSTEMS:  [X]  denotes positive finding, [ ]  denotes negative finding Cardiac  Comments:  Chest pain or chest pressure:    Shortness of breath upon exertion:    Short of  breath when lying flat:    Irregular heart rhythm:        Vascular    Pain in calf,  thigh, or hip brought on by ambulation:    Pain in feet at night that wakes you up from your sleep:     Blood clot in your veins:    Leg swelling:         Pulmonary    Oxygen at home:    Productive cough:     Wheezing:         Neurologic    Sudden weakness in arms or legs:     Sudden numbness in arms or legs:     Sudden onset of difficulty speaking or slurred speech:    Temporary loss of vision in one eye:     Problems with dizziness:         Gastrointestinal    Blood in stool:     Vomited blood:         Genitourinary    Burning when urinating:     Blood in urine:        Psychiatric    Major depression:         Hematologic    Bleeding problems:    Problems with blood clotting too easily:        Skin    Rashes or ulcers:        Constitutional    Fever or chills:      PHYSICAL EXAM: Vitals:   12/20/19 1332  BP: 125/85  Pulse: (!) 105  Temp: 98 F (36.7 C)  TempSrc: Skin  SpO2: 96%  Weight: 174 lb (78.9 kg)  Height: 5\' 10"  (1.778 m)    GENERAL: The patient is a well-nourished male, in no acute distress. The vital signs are documented above. CARDIAC: There is a regular rate and rhythm.  VASCULAR:  Femoral pulse on the left is appreciable a bit higher Right femoral pulse palpable No palpable right pedal pulses Necrosis of the right great toe tip PULMONARY: There is good air exchange bilaterally without wheezing or rales. ABDOMEN: Soft and non-tender with normal pitched bowel sounds.  MUSCULOSKELETAL: There are no major deformities or cyanosis. NEUROLOGIC: No focal weakness or paresthesias are detected.      DATA:    On the right he has an ABI of 0.49 with a monophasic waveform.  Right lower extremity arterial duplex shows biphasic flow down to the mid SFA as well as monophasic runoff in the PT distally (occluded proximally) and an occluded AT.     Assessment/Plan:  64 year old male with multiple medical comorbidities including critical limb ischemia with tissue loss in the right lower extremity that presents for 1 month follow-up after recently being evaluated by my partner Dr. Donnetta Hutching.  Over the last month he has had progression of tissue loss in the right foot as pictured above.  He has severe tibial disease and has previously undergone intervention in the right lower extremity as documented in HPI at [place] complicated by left left femoral pseudoaneurysm that ultimately resulted in thrombin injection and subsequent stenting into his common femoral artery with covered stents.  I think it would be reasonable to plan right leg arteriogram and possible intervention.  Discussed potentially accessing his right common femoral for runoff and then a retrograde tibial intervention to avoid the left groin given previous complications with covered stents in the left groin done at [place].  He is on Coumadin for both A. fib and mechanical aortic valve and he will require preadmission tomorrow for heparin bridge and reversing his Coumadin.  Tentative plan for right leg arteriogram and intervention  on Thursday.  Discussed he is at high risk for limb loss.   Marty Heck, MD [facility] Office: [phone]

## 2019-12-21 ENCOUNTER — Inpatient Hospital Stay (HOSPITAL_COMMUNITY)
Admission: RE | Admit: 2019-12-21 | Discharge: 2019-12-24 | DRG: 252 | Disposition: A | Discharge status: Home / Self Care | Payer: Medicare Other | Attending: Vascular Surgery | Admitting: Vascular Surgery

## 2019-12-21 ENCOUNTER — Other Ambulatory Visit
Admission: RE | Admit: 2019-12-21 | Discharge: 2019-12-21 | Disposition: A | Discharge status: Home / Self Care | Payer: Medicare Other | Source: Ambulatory Visit | Attending: Vascular Surgery | Admitting: Vascular Surgery

## 2019-12-21 ENCOUNTER — Encounter: Payer: Self-pay | Admitting: Cardiovascular Disease

## 2019-12-21 ENCOUNTER — Ambulatory Visit (INDEPENDENT_AMBULATORY_CARE_PROVIDER_SITE_OTHER): Payer: Medicare Other

## 2019-12-21 DIAGNOSIS — Z952 Presence of prosthetic heart valve: Secondary | ICD-10-CM | POA: Diagnosis not present

## 2019-12-21 DIAGNOSIS — E1152 Type 2 diabetes mellitus with diabetic peripheral angiopathy with gangrene: Secondary | ICD-10-CM | POA: Diagnosis present

## 2019-12-21 DIAGNOSIS — I132 Hypertensive heart and chronic kidney disease with heart failure and with stage 5 chronic kidney disease, or end stage renal disease: Secondary | ICD-10-CM | POA: Diagnosis present

## 2019-12-21 DIAGNOSIS — N186 End stage renal disease: Secondary | ICD-10-CM | POA: Diagnosis present

## 2019-12-21 DIAGNOSIS — D696 Thrombocytopenia, unspecified: Secondary | ICD-10-CM | POA: Diagnosis present

## 2019-12-21 DIAGNOSIS — Z905 Acquired absence of kidney: Secondary | ICD-10-CM

## 2019-12-21 DIAGNOSIS — I4821 Permanent atrial fibrillation: Secondary | ICD-10-CM | POA: Diagnosis present

## 2019-12-21 DIAGNOSIS — Z888 Allergy status to other drugs, medicaments and biological substances status: Secondary | ICD-10-CM

## 2019-12-21 DIAGNOSIS — Z01812 Encounter for preprocedural laboratory examination: Secondary | ICD-10-CM | POA: Insufficient documentation

## 2019-12-21 DIAGNOSIS — Z8616 Personal history of COVID-19: Secondary | ICD-10-CM | POA: Diagnosis not present

## 2019-12-21 DIAGNOSIS — D631 Anemia in chronic kidney disease: Secondary | ICD-10-CM | POA: Diagnosis present

## 2019-12-21 DIAGNOSIS — Z940 Kidney transplant status: Secondary | ICD-10-CM

## 2019-12-21 DIAGNOSIS — Z992 Dependence on renal dialysis: Secondary | ICD-10-CM | POA: Diagnosis not present

## 2019-12-21 DIAGNOSIS — Z85528 Personal history of other malignant neoplasm of kidney: Secondary | ICD-10-CM | POA: Diagnosis not present

## 2019-12-21 DIAGNOSIS — Z7952 Long term (current) use of systemic steroids: Secondary | ICD-10-CM

## 2019-12-21 DIAGNOSIS — I9589 Other hypotension: Secondary | ICD-10-CM | POA: Diagnosis present

## 2019-12-21 DIAGNOSIS — Z20822 Contact with and (suspected) exposure to covid-19: Secondary | ICD-10-CM | POA: Diagnosis present

## 2019-12-21 DIAGNOSIS — I2510 Atherosclerotic heart disease of native coronary artery without angina pectoris: Secondary | ICD-10-CM | POA: Diagnosis present

## 2019-12-21 DIAGNOSIS — I739 Peripheral vascular disease, unspecified: Secondary | ICD-10-CM | POA: Diagnosis present

## 2019-12-21 DIAGNOSIS — Z951 Presence of aortocoronary bypass graft: Secondary | ICD-10-CM

## 2019-12-21 DIAGNOSIS — I70235 Atherosclerosis of native arteries of right leg with ulceration of other part of foot: Secondary | ICD-10-CM | POA: Diagnosis not present

## 2019-12-21 DIAGNOSIS — I255 Ischemic cardiomyopathy: Secondary | ICD-10-CM | POA: Diagnosis present

## 2019-12-21 DIAGNOSIS — Z955 Presence of coronary angioplasty implant and graft: Secondary | ICD-10-CM

## 2019-12-21 DIAGNOSIS — I5022 Chronic systolic (congestive) heart failure: Secondary | ICD-10-CM | POA: Diagnosis present

## 2019-12-21 DIAGNOSIS — Z5181 Encounter for therapeutic drug level monitoring: Secondary | ICD-10-CM | POA: Diagnosis not present

## 2019-12-21 DIAGNOSIS — I4811 Longstanding persistent atrial fibrillation: Secondary | ICD-10-CM

## 2019-12-21 DIAGNOSIS — I350 Nonrheumatic aortic (valve) stenosis: Secondary | ICD-10-CM | POA: Diagnosis present

## 2019-12-21 DIAGNOSIS — E11621 Type 2 diabetes mellitus with foot ulcer: Secondary | ICD-10-CM | POA: Diagnosis present

## 2019-12-21 DIAGNOSIS — E785 Hyperlipidemia, unspecified: Secondary | ICD-10-CM | POA: Diagnosis present

## 2019-12-21 DIAGNOSIS — L97519 Non-pressure chronic ulcer of other part of right foot with unspecified severity: Secondary | ICD-10-CM | POA: Diagnosis present

## 2019-12-21 DIAGNOSIS — Z7901 Long term (current) use of anticoagulants: Secondary | ICD-10-CM | POA: Diagnosis not present

## 2019-12-21 DIAGNOSIS — Z7902 Long term (current) use of antithrombotics/antiplatelets: Secondary | ICD-10-CM

## 2019-12-21 DIAGNOSIS — E1122 Type 2 diabetes mellitus with diabetic chronic kidney disease: Secondary | ICD-10-CM | POA: Diagnosis present

## 2019-12-21 DIAGNOSIS — Z79899 Other long term (current) drug therapy: Secondary | ICD-10-CM

## 2019-12-21 LAB — COMPREHENSIVE METABOLIC PANEL
ALT: 25 U/L (ref 0–44)
AST: 29 U/L (ref 15–41)
Albumin: 3.4 g/dL — ABNORMAL LOW (ref 3.5–5.0)
Alkaline Phosphatase: 148 U/L — ABNORMAL HIGH (ref 38–126)
Anion gap: 11 (ref 5–15)
BUN: 13 mg/dL (ref 8–23)
CO2: 33 mmol/L — ABNORMAL HIGH (ref 22–32)
Calcium: 8.7 mg/dL — ABNORMAL LOW (ref 8.9–10.3)
Chloride: 94 mmol/L — ABNORMAL LOW (ref 98–111)
Creatinine, Ser: 4.18 mg/dL — ABNORMAL HIGH (ref 0.61–1.24)
GFR calc Af Amer: 17 mL/min — ABNORMAL LOW (ref >60)
GFR calc non Af Amer: 14 mL/min — ABNORMAL LOW (ref >60)
Glucose, Bld: 110 mg/dL — ABNORMAL HIGH (ref 70–99)
Potassium: 3.9 mmol/L (ref 3.5–5.1)
Sodium: 138 mmol/L (ref 135–145)
Total Bilirubin: 0.9 mg/dL (ref 0.3–1.2)
Total Protein: 6.9 g/dL (ref 6.5–8.1)

## 2019-12-21 LAB — CBC
HCT: 40.2 % (ref 39.0–52.0)
Hemoglobin: 12 g/dL — ABNORMAL LOW (ref 13.0–17.0)
MCH: 31.9 pg (ref 26.0–34.0)
MCHC: 29.9 g/dL — ABNORMAL LOW (ref 30.0–36.0)
MCV: 106.9 fL — ABNORMAL HIGH (ref 80.0–100.0)
Platelets: 82 10*3/uL — ABNORMAL LOW (ref 150–400)
RBC: 3.76 MIL/uL — ABNORMAL LOW (ref 4.22–5.81)
RDW: 17.7 % — ABNORMAL HIGH (ref 11.5–15.5)
WBC: 5 10*3/uL (ref 4.0–10.5)
nRBC: 0 % (ref 0.0–0.2)

## 2019-12-21 LAB — TYPE AND SCREEN
ABO/RH(D): O POS
Antibody Screen: NEGATIVE

## 2019-12-21 LAB — SARS CORONAVIRUS 2 BY RT PCR (HOSPITAL ORDER, PERFORMED IN ~~LOC~~ HOSPITAL LAB): SARS Coronavirus 2: NEGATIVE

## 2019-12-21 LAB — PROTIME-INR
INR: 2.3 — ABNORMAL HIGH (ref 0.8–1.2)
Prothrombin Time: 24.3 seconds — ABNORMAL HIGH (ref 11.4–15.2)

## 2019-12-21 MED ORDER — ONDANSETRON HCL 4 MG/2ML IJ SOLN: 4.0000 mg | Freq: Four times a day (QID) | INTRAMUSCULAR | Status: DC | PRN

## 2019-12-21 MED ORDER — PANTOPRAZOLE SODIUM 40 MG PO TBEC
40.0000 mg | DELAYED_RELEASE_TABLET | Freq: Every day | ORAL | Status: DC
  Administered 2019-12-21 – 2019-12-24 (×4): 40 mg via ORAL
  Filled 2019-12-21 (×4): qty 1

## 2019-12-21 MED ORDER — SODIUM CHLORIDE 0.9% IV SOLUTION: Freq: Once | INTRAVENOUS | Status: AC

## 2019-12-21 MED ORDER — HEPARIN (PORCINE) 25000 UT/250ML-% IV SOLN
1350.0000 [IU]/h | INTRAVENOUS | Status: DC
  Administered 2019-12-21: 1250 [IU]/h via INTRAVENOUS
  Filled 2019-12-21: qty 250

## 2019-12-21 MED ORDER — CLOPIDOGREL BISULFATE 75 MG PO TABS
75.0000 mg | ORAL_TABLET | Freq: Every day | ORAL | Status: DC
  Administered 2019-12-22 – 2019-12-24 (×3): 75 mg via ORAL
  Filled 2019-12-21 (×3): qty 1

## 2019-12-21 MED ORDER — OXYCODONE-ACETAMINOPHEN 5-325 MG PO TABS: 1.0000 | ORAL_TABLET | ORAL | Status: DC | PRN

## 2019-12-21 MED ORDER — BISACODYL 10 MG RE SUPP: 10.0000 mg | Freq: Every day | RECTAL | Status: DC | PRN

## 2019-12-21 MED ORDER — HYDROMORPHONE HCL 1 MG/ML IJ SOLN: 0.5000 mg | INTRAMUSCULAR | Status: DC | PRN

## 2019-12-21 MED ORDER — SODIUM CHLORIDE 0.9% FLUSH: 3.0000 mL | Freq: Two times a day (BID) | INTRAVENOUS | Status: DC

## 2019-12-21 MED ORDER — GUAIFENESIN-DM 100-10 MG/5ML PO SYRP
15.0000 mL | ORAL_SOLUTION | ORAL | Status: DC | PRN
  Administered 2019-12-22: 15 mL via ORAL
  Filled 2019-12-21: qty 15

## 2019-12-21 MED ORDER — PANTOPRAZOLE SODIUM 40 MG PO TBEC: 40.0000 mg | DELAYED_RELEASE_TABLET | Freq: Every day | ORAL | Status: DC

## 2019-12-21 MED ORDER — SEVELAMER CARBONATE 800 MG PO TABS
4000.0000 mg | ORAL_TABLET | Freq: Three times a day (TID) | ORAL | Status: DC
  Filled 2019-12-21: qty 5

## 2019-12-21 MED ORDER — METOPROLOL TARTRATE 100 MG PO TABS
100.0000 mg | ORAL_TABLET | Freq: Two times a day (BID) | ORAL | Status: DC
  Administered 2019-12-21 – 2019-12-24 (×5): 100 mg via ORAL
  Filled 2019-12-21 (×6): qty 1

## 2019-12-21 MED ORDER — ACETAMINOPHEN 650 MG RE SUPP: 325.0000 mg | RECTAL | Status: DC | PRN

## 2019-12-21 MED ORDER — POTASSIUM CHLORIDE CRYS ER 20 MEQ PO TBCR
20.0000 meq | EXTENDED_RELEASE_TABLET | Freq: Once | ORAL | Status: DC
  Filled 2019-12-21: qty 2

## 2019-12-21 MED ORDER — SODIUM CHLORIDE 0.9% FLUSH: 3.0000 mL | INTRAVENOUS | Status: DC | PRN

## 2019-12-21 MED ORDER — ALLOPURINOL 300 MG PO TABS
300.0000 mg | ORAL_TABLET | Freq: Every day | ORAL | Status: DC
  Administered 2019-12-22 – 2019-12-24 (×3): 300 mg via ORAL
  Filled 2019-12-21 (×3): qty 1

## 2019-12-21 MED ORDER — HYDROXYCHLOROQUINE SULFATE 200 MG PO TABS
200.0000 mg | ORAL_TABLET | Freq: Two times a day (BID) | ORAL | Status: DC
  Administered 2019-12-21 – 2019-12-24 (×6): 200 mg via ORAL
  Filled 2019-12-21 (×6): qty 1

## 2019-12-21 MED ORDER — PHENOL 1.4 % MT LIQD: 1.0000 | OROMUCOSAL | Status: DC | PRN

## 2019-12-21 MED ORDER — ROSUVASTATIN CALCIUM 20 MG PO TABS
40.0000 mg | ORAL_TABLET | Freq: Every day | ORAL | Status: DC
  Administered 2019-12-22 – 2019-12-23 (×2): 40 mg via ORAL
  Filled 2019-12-21 (×2): qty 2

## 2019-12-21 MED ORDER — LABETALOL HCL 5 MG/ML IV SOLN: 10.0000 mg | INTRAVENOUS | Status: DC | PRN

## 2019-12-21 MED ORDER — HYDRALAZINE HCL 20 MG/ML IJ SOLN: 5.0000 mg | INTRAMUSCULAR | Status: DC | PRN

## 2019-12-21 MED ORDER — POLYETHYLENE GLYCOL 3350 17 G PO PACK
17.0000 g | PACK | Freq: Every day | ORAL | Status: DC | PRN
  Administered 2019-12-22: 17 g via ORAL
  Filled 2019-12-21: qty 1

## 2019-12-21 MED ORDER — ACETAMINOPHEN 325 MG PO TABS: 325.0000 mg | ORAL_TABLET | ORAL | Status: DC | PRN

## 2019-12-21 MED ORDER — METOPROLOL TARTRATE 5 MG/5ML IV SOLN: 2.0000 mg | INTRAVENOUS | Status: DC | PRN

## 2019-12-21 NOTE — H&P (Signed)
HPI: Jeremy [name] is a 64 y.o. male with multiple medical problems including atrial fibrillation on Coumadin, peripheral arterial disease, coronary artery disease status post CABG and subsequent PCI, aortic stenosis with mechanical aortic valve, ESRD, hypertension, hyperlipidemia that presents for 1 month follow-up of right foot ulcer.  He was previously seen by Dr. Donnetta Hutching for a second opinion and then since Dr. Donnetta Hutching moved to [place] I was asked to follow his care here in [place].  He feels his right foot has gotten worse over the last month.  Now describes gangrene at the toe tip.  He has a complex recent history and had an area of nonhealing ulceration in his right foot and on 08/25/2019 underwent arteriography via the left femoral approach at [facility]. He had angioplasty of the tibioperoneal trunk, peroneal and posterior tibial arteries on the right with drug-eluting balloon. 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 Apparently had complication of bleeding follow this and was taken to the [facility] lab and underwent Viabahn stenting of his deep femoral artery, superficial femoral artery and common femoral artery for control of bleeding.       Past Medical History:  Diagnosis Date  . Abnormal stress test 03/19/2018  . 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 Aortic stenosis    a. 2014 s/p mech AVR ([facility])-->chronic coumadin; b. 08/2018 Echo: Mild AS/AI. Nl fxn/gradient.  . Arthritis   . Atrial fibrillation ([place])   . CAD (coronary artery disease)    a. 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 02/2018 MV [facility]): large inf, infsept, inflat infarct w/ min rev, EF 30%; c. 06/2018 Cath: LM min irregs, LAD 60p, 95/30/49m, LCX min irregs, RCA 40p, 16m, 90d, RPAV 90, RPDA 95ost; d. 09/2018 CABGx3 (LIMA->LAD, VG->D1, VG->PDA).  . Chronic anticoagulation 06/2012   a. Coumadin in  the setting of mech AVR and afib.  Marland Kitchen COVID-19 03/15/2019  . CPAP (continuous positive airway pressure) dependence   . Dialysis patient [facility]) 04/07/2006   Patient started HD around 1996,  had 1st transplant LLQ around 1998 until 2007 when they found renal cell cancer in transplant and both native kidneys, all were removed > went back on HD until 2nd transplant RLQ in 2014 which lasted 3 yrs; it was embolized for suspected acute rejection. Is currently on HD MWF in [place] w/ [facility] nephrology.  . ESRD (end stage renal disease) ([place])    a. 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 1999 s/p Kidney transplant; c. 2014 Bilat nephrectomy (transplanted kidney resected) in the setting of renal cell carcinoma; d. PD until 11/2017-->HD.  Marland Kitchen Heart murmur   . HFrEF (heart failure with reduced ejection fraction) ([place])    a. 07/2016 Echo: EF 55-60%; b. 02/2018 MV: EF 30%; c. 04/2018 Echo: EF 35%; d. 08/2018 Echo: EF 30-35%. Glob HK w/o rwma. Mildly reduced RV fxn. Mod to sev dil LA. Nl AoV fxn.  Marland Kitchen Hx of colonoscopy    2009 by Dr. Laural Golden. Normal except for small submucosal lipoma. No evidence of polyps or colitis.  . Hyperlipidemia   . Hypertension   . Hypogonadism in male 07/25/2015  . Ischemic cardiomyopathy    a. 07/2016 Echo: EF 55-60%; b. 02/2018 MV: EF 30%; c. 04/2018 Echo: EF 35%; d. 08/2018 Echo: EF 30-35%.  . OSA (obstructive sleep apnea)    No CPAP  . Permanent atrial  fibrillation ([place])    a. CHA2DS2VASc = 3-->Coumadin (also mech AVR); b. 06/2018 Amio started 2/2 elevated rates.  . Renal cell cancer ([place])   . Renal cell carcinoma ([place]) 08/04/2016         Past Surgical History:  Procedure Laterality Date  . AORTIC VALVE REPLACEMENT  3 /11 /2014   At [facility]    . CAPD INSERTION N/A 02/19/2017   Procedure: LAPAROSCOPIC INSERTION CONTINUOUS AMBULATORY PERITONEAL DIALYSIS  (CAPD) CATHETER;  Surgeon: Algernon Huxley, MD;  Location:  [facility] ORS;  Service: General;  Laterality: N/A;  . CORONARY ANGIOPLASTY WITH STENT PLACEMENT    . CORONARY ARTERY BYPASS GRAFT N/A 09/16/2018   Procedure: CORONARY ARTERY BYPASS GRAFT times three      with left internal mammary artery and endoharvest of right leg greater saphenous vein;  Surgeon: Melrose Nakayama, MD;  Location: [facility];  Service: Open Heart Surgery;  Laterality: N/A;  . CORONARY STENT INTERVENTION N/A 07/15/2019   Procedure: CORONARY STENT INTERVENTION;  Surgeon: Wellington Hampshire, MD;  Location: [place] CV [facility];  Service: Cardiovascular;  Laterality: N/A;  . DG AV DIALYSIS  SHUNT ACCESS EXIST*L* OR     left arm  . EPIGASTRIC HERNIA REPAIR N/A 02/19/2017   Procedure: HERNIA REPAIR EPIGASTRIC ADULT;  Surgeon: Robert Bellow, MD;  Location: [facility] ORS;  Service: General;  Laterality: N/A;  . EYE SURGERY     bilateral cataract  . HERNIA REPAIR     UMBILICAL HERNIA  . HERNIA REPAIR  02/19/2017  . INNER EAR SURGERY     20 years ago  . KIDNEY TRANSPLANT    . LEFT HEART CATH AND CORS/GRAFTS ANGIOGRAPHY N/A 07/15/2019   Procedure: LEFT HEART CATH AND CORS/GRAFTS ANGIOGRAPHY;  Surgeon: Wellington Hampshire, MD;  Location: [place] [facility];  Service: Cardiovascular;  Laterality: N/A;  . LOWER EXTREMITY ANGIOGRAPHY Right 08/25/2019   Procedure: LOWER EXTREMITY ANGIOGRAPHY;  Surgeon: Algernon Huxley, MD;  Location: [place] CV LAB;  Service: Cardiovascular;  Laterality: Right;  . LOWER EXTREMITY ANGIOGRAPHY Left 09/29/2019   Procedure: Lower Extremity Angiography;  Surgeon: Algernon Huxley, MD;  Location: [place] [facility];  Service: Cardiovascular;  Laterality: Left;  . Peritoneal Dialysis access placement  02/19/2017  . PSEUDOANERYSM COMPRESSION N/A 09/29/2019   Procedure: PSEUDOANERYSM COMPRESSION;  Surgeon: Algernon Huxley, MD;  Location: [place] CV [facility];  Service: Cardiovascular;  Laterality: N/A;  . REMOVAL OF A DIALYSIS CATHETER N/A 11/23/2017    Procedure: REMOVAL OF A PERITONEAL DIALYSIS CATHETER;  Surgeon: Algernon Huxley, MD;  Location: [facility] OR[facility];  Service: Vascular;  Laterality: N/A;  . RIGHT/LEFT HEART CATH AND CORONARY ANGIOGRAPHY Bilateral 06/07/2018   Procedure: RIGHT/LEFT HEART CATH AND CORONARY ANGIOGRAPHY;  Surgeon: Wellington Hampshire, MD;  Location: [place] [facility];  Service: Cardiovascular;  Laterality: Bilateral;  . TEE WITHOUT CARDIOVERSION N/A 07/21/2016   Procedure: TRANSESOPHAGEAL ECHOCARDIOGRAM (TEE);  Surgeon: Wellington Hampshire, MD;  Location: [facility];  Service: Cardiovascular;  Laterality: N/A;  . TEE WITHOUT CARDIOVERSION N/A 09/16/2018   Procedure: TRANSESOPHAGEAL ECHOCARDIOGRAM (TEE);  Surgeon: Melrose Nakayama, MD;  Location: [place];  Service: Open Heart Surgery;  Laterality: N/A;         Family History  Problem Relation Age of Onset  . Cancer Mother        pancreatic  . Heart disease Father   . Diabetes Father     SOCIAL  HISTORY: Social History       Tobacco Use  . Smoking status: Never Smoker  . Smokeless tobacco: Never Used  Substance Use Topics  . Alcohol use: No         Allergies  Allergen Reactions  . Statins     Other reaction(s): Arthralgias (intolerance), Myalgias (intolerance)  Pt taking pravastatin   . Sertraline Rash          Current Outpatient Medications  Medication Sig Dispense Refill  . acetaminophen (TYLENOL) 500 MG tablet Take 1,000 mg 2 (two) times daily as needed by mouth for moderate pain.    Marland Kitchen allopurinol (ZYLOPRIM) 300 MG tablet Take 300 mg by mouth daily.  5  . azelastine (ASTELIN) 0.1 % nasal spray Place 1 spray into both nostrils 2 (two) times daily. Use in each nostril as directed 30 mL 0  . benzonatate (TESSALON) 200 MG capsule Take 1 capsule (200 mg total) by mouth 3 (three) times daily as needed for cough. 30 capsule 1  . cholecalciferol (VITAMIN D3) 25 MCG (1000 UNIT) tablet Take 1,000 Units by mouth daily.    . cinacalcet  (SENSIPAR) 30 MG tablet Take 30 mg by mouth every other day.     . clopidogrel (PLAVIX) 75 MG tablet Take 1 tablet (75 mg total) by mouth daily with breakfast. 90 tablet 3  . Epoetin Alfa-epbx (RETACRIT IJ) Epoetin alfa - epbx (Retacrit)    . ezetimibe (ZETIA) 10 MG tablet Take 1 tablet (10 mg total) by mouth daily. 90 tablet 3  . hydroxychloroquine (PLAQUENIL) 200 MG tablet Take 200 mg by mouth 2 (two) times daily.    Marland Kitchen lidocaine-prilocaine (EMLA) cream Apply 1 application topically as needed (for fistula). Monday, Wednesday, Friday for dialysis    . loratadine (CLARITIN) 10 MG tablet Take 10 mg by mouth daily.    . metoprolol tartrate (LOPRESSOR) 100 MG tablet Take 1 tablet (100 mg total) by mouth 2 (two) times daily. Patient to call when refill needed. 180 tablet 3  . multivitamin (RENA-VIT) TABS tablet Take 1 tablet by mouth daily.    . nitroGLYCERIN (NITROSTAT) 0.4 MG SL tablet Place 1 tablet (0.4 mg total) under the tongue every 5 (five) minutes as needed for chest pain. 25 tablet 3  . pantoprazole (PROTONIX) 40 MG tablet Take 1 tablet (40 mg total) by mouth daily. 90 tablet 3  . predniSONE (DELTASONE) 1 MG tablet Take 4 mg by mouth daily with breakfast.    . rosuvastatin (CRESTOR) 40 MG tablet Take 1 tablet (40 mg total) by mouth daily at 6 PM. 90 tablet 3  . sevelamer carbonate (RENVELA) 800 MG tablet Take 4,000 mg by mouth 3 (three) times daily with meals. 5 tabs per meal     . traZODone (DESYREL) 50 MG tablet TAKE 1/2 -1 TABLET (25-50 MG TOTAL) BY MOUTH AT BEDTIME AS NEEDED FOR SLEEP. 90 tablet 2  . warfarin (COUMADIN) 1 MG tablet Take 1 mg by mouth daily.    Marland Kitchen zinc sulfate 220 (50 Zn) MG capsule Take 220 mg by mouth daily.     No current facility-administered medications for this visit.    REVIEW OF SYSTEMS:  [X]  denotes positive finding, [ ]  denotes negative finding Cardiac  Comments:  Chest pain or chest pressure:    Shortness of breath upon exertion:     Short of breath when lying flat:    Irregular heart rhythm:        Vascular    Pain  in calf, thigh, or hip brought on by ambulation:    Pain in feet at night that wakes you up from your sleep:     Blood clot in your veins:    Leg swelling:         Pulmonary    Oxygen at home:    Productive cough:     Wheezing:         Neurologic    Sudden weakness in arms or legs:     Sudden numbness in arms or legs:     Sudden onset of difficulty speaking or slurred speech:    Temporary loss of vision in one eye:     Problems with dizziness:         Gastrointestinal    Blood in stool:     Vomited blood:         Genitourinary    Burning when urinating:     Blood in urine:        Psychiatric    Major depression:         Hematologic    Bleeding problems:    Problems with blood clotting too easily:        Skin    Rashes or ulcers:        Constitutional    Fever or chills:      PHYSICAL EXAM:    Vitals:   12/20/19 1332  BP: 125/85  Pulse: (!) 105  Temp: 98 F (36.7 C)  TempSrc: Skin  SpO2: 96%  Weight: 174 lb (78.9 kg)  Height: 5\' 10"  (1.778 m)    GENERAL: The patient is a well-nourished male, in no acute distress. The vital signs are documented above. CARDIAC: There is a regular rate and rhythm.  VASCULAR:  Femoral pulse on the left is appreciable a bit higher Right femoral pulse palpable No palpable right pedal pulses Necrosis of the right great toe tip PULMONARY: There is good air exchange bilaterally without wheezing or rales. ABDOMEN: Soft and non-tender with normal pitched bowel sounds.  MUSCULOSKELETAL: There are no major deformities or cyanosis. NEUROLOGIC: No focal weakness or paresthesias are detected.      DATA:    On the right he has an ABI of 0.49 with a monophasic waveform.  Right lower extremity arterial duplex shows biphasic flow down to  the mid SFA as well as monophasic runoff in the PT distally (occluded proximally) and an occluded AT.    Assessment/Plan:  64 year old male with multiple medical comorbidities including critical limb ischemia with tissue loss in the right lower extremity that presents for 1 month follow-up after recently being evaluated by my partner Dr. Donnetta Hutching.  Over the last month he has had progression of tissue loss in the right foot as pictured above.  He has severe tibial disease and has previously undergone intervention in the right lower extremity as documented in HPI at [place] complicated by left left femoral pseudoaneurysm that ultimately resulted in thrombin injection and subsequent stenting into his common femoral artery with covered stents.  I think it would be reasonable to plan right leg arteriogram and possible intervention.  Discussed potentially accessing his right common femoral for runoff vs antegrade stick in the right groin vs retrograde tibial intervention to avoid the left groin given previous complications with covered stents in the left groin done at [place].  He is on Coumadin for both A. fib and mechanical aortic valve and he will require preadmission tomorrow for heparin bridge and reversing his  Coumadin.  Tentative plan for right leg arteriogram and intervention on Thursday.  Discussed he is at high risk for limb loss.   Marty Heck, MD [facility] Office: [phone]

## 2019-12-21 NOTE — Progress Notes (Signed)
Patient to room 4E08 from home. Monitor on CCMD notified. IV placed. Vital signs obtained. Will continue to monitor.  Era Bumpers, RN

## 2019-12-21 NOTE — Patient Instructions (Signed)
Sent MyChart message to pt and advised him to : - continue to take 1 tablet of warfarin daily.  - Recheck INR in 1 week on Monday

## 2019-12-21 NOTE — Progress Notes (Addendum)
ANTICOAGULATION CONSULT NOTE - Initial Consult  Pharmacy Consult for IV Heparin Indication:  Mechanical heart valve, atrial fibrillation  Allergies  Allergen Reactions  . Statins     Other reaction(s): Arthralgias (intolerance), Myalgias (intolerance)  Pt taking pravastatin   . Sertraline Rash    Patient Measurements: Total Body Weight: 78.9 kg Height: 70 inches Heparin Dosing Weight: 78.9 kg  Labs: Recent Labs    12/20/19 0000  INR 2.9    CrCl cannot be calculated (Patient's most recent lab result is older than the maximum 21 days allowed.).   Medical History: Past Medical History:  Diagnosis Date  . Abnormal stress test 03/19/2018  . 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 Aortic stenosis    a. 2014 s/p mech AVR ([facility])-->chronic coumadin; b. 08/2018 Echo: Mild AS/AI. Nl fxn/gradient.  . Arthritis   . Atrial fibrillation ([place])   . CAD (coronary artery disease)    a. 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 02/2018 MV [facility]): large inf, infsept, inflat infarct w/ min rev, EF 30%; c. 06/2018 Cath: LM min irregs, LAD 60p, 95/30/44m, LCX min irregs, RCA 40p, 88m, 90d, RPAV 90, RPDA 95ost; d. 09/2018 CABGx3 (LIMA->LAD, VG->D1, VG->PDA).  . Chronic anticoagulation 06/2012   a. Coumadin in the setting of mech AVR and afib.  Marland Kitchen COVID-19 03/15/2019  . CPAP (continuous positive airway pressure) dependence   . Dialysis patient [facility]) 04/07/2006   Patient started HD around 1996,  had 1st transplant LLQ around 1998 until 2007 when they found renal cell cancer in transplant and both native kidneys, all were removed > went back on HD until 2nd transplant RLQ in 2014 which lasted 3 yrs; it was embolized for suspected acute rejection. Is currently on HD MWF in [place] w/ [facility] nephrology.  . ESRD (end stage renal disease) ([place])    a. 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 1999 s/p Kidney transplant; c. 2014 Bilat  nephrectomy (transplanted kidney resected) in the setting of renal cell carcinoma; d. PD until 11/2017-->HD.  Marland Kitchen Heart murmur   . HFrEF (heart failure with reduced ejection fraction) ([place])    a. 07/2016 Echo: EF 55-60%; b. 02/2018 MV: EF 30%; c. 04/2018 Echo: EF 35%; d. 08/2018 Echo: EF 30-35%. Glob HK w/o rwma. Mildly reduced RV fxn. Mod to sev dil LA. Nl AoV fxn.  Marland Kitchen Hx of colonoscopy    2009 by Dr. Laural Golden. Normal except for small submucosal lipoma. No evidence of polyps or colitis.  . Hyperlipidemia   . Hypertension   . Hypogonadism in male 07/25/2015  . Ischemic cardiomyopathy    a. 07/2016 Echo: EF 55-60%; b. 02/2018 MV: EF 30%; c. 04/2018 Echo: EF 35%; d. 08/2018 Echo: EF 30-35%.  . OSA (obstructive sleep apnea)    No CPAP  . Permanent atrial fibrillation ([facility])    a. CHA2DS2VASc = 3-->Coumadin (also mech AVR); b. 06/2018 Amio started 2/2 elevated rates.  . Renal cell cancer ([employer] [employer])   . Renal cell carcinoma ([place]) 08/04/2016    Assessment: 64 yr old male was admitted today with critical limb ischemia with tissue loss in RLE for at least 1 month, with progression of tissue loss in R foot. VVS plans R leg arteriogram and possible intervention this admission (tentatively planned for Thursday). Pharmacy was consulted to dose heparin (warfarin on hold for pending procedures).  Pt is on warfarin at home for atrial fibrillation  and mechanical aortic valve. Per 12/26/19 anticoagulation visit: warfarin regimen is 1 mg PO daily, INR goal 2.5-3.5. INR at 0000 on 9/14 was 2.9; pt stated that he last took warfarin on 9/13 PM. Plan to start heparin infusion when INR <2.5.  INR this afternoon was 2.3; H/H 12.0/40.2, platelets 82; CrCl 18.9 ml/min  Goal of Therapy:  Heparin level 0.3-0.7 units/ml Monitor platelets by anticoagulation protocol: Yes   Plan:  Start heparin infusion at 1250 units/hr (no bolus) Check heparin level in 8 hrs Monitor daily heparin level, CBC Monitor for signs/symptoms of  bleeding  Gillermina Hu, PharmD, BCPS, [facility] Clinical Pharmacist 12/21/2019,3:49 PM

## 2019-12-22 ENCOUNTER — Ambulatory Visit: Payer: Medicare Other

## 2019-12-22 ENCOUNTER — Ambulatory Visit (HOSPITAL_COMMUNITY): Admission: RE | Admit: 2019-12-22 | Payer: Medicare Other | Source: Home / Self Care | Admitting: Vascular Surgery

## 2019-12-22 ENCOUNTER — Encounter (HOSPITAL_COMMUNITY): Payer: Self-pay | Admitting: Vascular Surgery

## 2019-12-22 ENCOUNTER — Encounter (HOSPITAL_COMMUNITY)
Admission: RE | Disposition: A | Discharge status: Home / Self Care | Payer: Self-pay | Source: Ambulatory Visit | Attending: Vascular Surgery

## 2019-12-22 DIAGNOSIS — I70235 Atherosclerosis of native arteries of right leg with ulceration of other part of foot: Secondary | ICD-10-CM

## 2019-12-22 HISTORY — PX: ABDOMINAL AORTOGRAM W/LOWER EXTREMITY: CATH118223

## 2019-12-22 HISTORY — PX: PERIPHERAL VASCULAR INTERVENTION: CATH118257

## 2019-12-22 LAB — PROTIME-INR
INR: 1.8 — ABNORMAL HIGH (ref 0.8–1.2)
INR: 1.6 — ABNORMAL HIGH (ref 0.8–1.2)
Prothrombin Time: 20 seconds — ABNORMAL HIGH (ref 11.4–15.2)
Prothrombin Time: 18.8 seconds — ABNORMAL HIGH (ref 11.4–15.2)

## 2019-12-22 LAB — BASIC METABOLIC PANEL
Anion gap: 16 — ABNORMAL HIGH (ref 5–15)
BUN: 22 mg/dL (ref 8–23)
CO2: 26 mmol/L (ref 22–32)
Calcium: 8.9 mg/dL (ref 8.9–10.3)
Chloride: 95 mmol/L — ABNORMAL LOW (ref 98–111)
Creatinine, Ser: 5.28 mg/dL — ABNORMAL HIGH (ref 0.61–1.24)
GFR calc Af Amer: 12 mL/min — ABNORMAL LOW (ref >60)
GFR calc non Af Amer: 11 mL/min — ABNORMAL LOW (ref >60)
Glucose, Bld: 82 mg/dL (ref 70–99)
Potassium: 5 mmol/L (ref 3.5–5.1)
Sodium: 137 mmol/L (ref 135–145)

## 2019-12-22 LAB — CBC
HCT: 38.5 % — ABNORMAL LOW (ref 39.0–52.0)
Hemoglobin: 11.7 g/dL — ABNORMAL LOW (ref 13.0–17.0)
MCH: 32.9 pg (ref 26.0–34.0)
MCHC: 30.4 g/dL (ref 30.0–36.0)
MCV: 108.1 fL — ABNORMAL HIGH (ref 80.0–100.0)
Platelets: 75 10*3/uL — ABNORMAL LOW (ref 150–400)
RBC: 3.56 MIL/uL — ABNORMAL LOW (ref 4.22–5.81)
RDW: 17.6 % — ABNORMAL HIGH (ref 11.5–15.5)
WBC: 5.1 10*3/uL (ref 4.0–10.5)
nRBC: 0 % (ref 0.0–0.2)

## 2019-12-22 LAB — HEPARIN LEVEL (UNFRACTIONATED): Heparin Unfractionated: 0.26 IU/mL — ABNORMAL LOW (ref 0.30–0.70)

## 2019-12-22 LAB — POCT ACTIVATED CLOTTING TIME: Activated Clotting Time: 268 seconds

## 2019-12-22 SURGERY — ABDOMINAL AORTOGRAM W/LOWER EXTREMITY
Anesthesia: LOCAL | Laterality: Right

## 2019-12-22 MED ORDER — CINACALCET HCL 30 MG PO TABS
30.0000 mg | ORAL_TABLET | Freq: Every day | ORAL | Status: DC
  Administered 2019-12-22 – 2019-12-23 (×2): 30 mg via ORAL
  Filled 2019-12-22 (×2): qty 1

## 2019-12-22 MED ORDER — MIDODRINE HCL 5 MG PO TABS
10.0000 mg | ORAL_TABLET | ORAL | Status: DC
  Administered 2019-12-23: 10 mg via ORAL
  Filled 2019-12-22: qty 2

## 2019-12-22 MED ORDER — CHLORHEXIDINE GLUCONATE CLOTH 2 % EX PADS
6.0000 | MEDICATED_PAD | Freq: Every day | CUTANEOUS | Status: DC
  Administered 2019-12-22 – 2019-12-24 (×3): 6 via TOPICAL

## 2019-12-22 MED ORDER — LABETALOL HCL 5 MG/ML IV SOLN: 10.0000 mg | INTRAVENOUS | Status: DC | PRN

## 2019-12-22 MED ORDER — HEPARIN (PORCINE) IN NACL 1000-0.9 UT/500ML-% IV SOLN
INTRAVENOUS | Status: DC | PRN
  Administered 2019-12-22: 500 mL

## 2019-12-22 MED ORDER — HYDRALAZINE HCL 20 MG/ML IJ SOLN: 5.0000 mg | INTRAMUSCULAR | Status: DC | PRN

## 2019-12-22 MED ORDER — FENTANYL CITRATE (PF) 100 MCG/2ML IJ SOLN
INTRAMUSCULAR | Status: DC | PRN
Start: 2019-12-22 — End: 2019-12-22
  Administered 2019-12-22: 50 ug via INTRAVENOUS

## 2019-12-22 MED ORDER — HEPARIN (PORCINE) 25000 UT/250ML-% IV SOLN
1450.0000 [IU]/h | INTRAVENOUS | Status: DC
  Administered 2019-12-22: 1350 [IU]/h via INTRAVENOUS
  Administered 2019-12-23 – 2019-12-24 (×2): 1450 [IU]/h via INTRAVENOUS
  Filled 2019-12-22 (×4): qty 250

## 2019-12-22 MED ORDER — SODIUM CHLORIDE 0.9% FLUSH: 3.0000 mL | INTRAVENOUS | Status: DC | PRN

## 2019-12-22 MED ORDER — SODIUM CHLORIDE 0.9% FLUSH
3.0000 mL | Freq: Two times a day (BID) | INTRAVENOUS | Status: DC
  Administered 2019-12-22 – 2019-12-24 (×4): 3 mL via INTRAVENOUS

## 2019-12-22 MED ORDER — MIDAZOLAM HCL 2 MG/2ML IJ SOLN
INTRAMUSCULAR | Status: DC | PRN
  Administered 2019-12-22: 1 mg via INTRAVENOUS

## 2019-12-22 MED ORDER — ONDANSETRON HCL 4 MG/2ML IJ SOLN: 4.0000 mg | Freq: Four times a day (QID) | INTRAMUSCULAR | Status: DC | PRN

## 2019-12-22 MED ORDER — LIDOCAINE HCL (PF) 1 % IJ SOLN
INTRAMUSCULAR | Status: DC | PRN
  Administered 2019-12-22: 20 mL via INTRADERMAL
  Administered 2019-12-22: 2 mL via INTRADERMAL

## 2019-12-22 MED ORDER — SEVELAMER CARBONATE 800 MG PO TABS
3200.0000 mg | ORAL_TABLET | Freq: Three times a day (TID) | ORAL | Status: DC
  Administered 2019-12-22 – 2019-12-24 (×4): 3200 mg via ORAL
  Filled 2019-12-22 (×3): qty 4

## 2019-12-22 MED ORDER — WARFARIN - PHARMACIST DOSING INPATIENT: Freq: Every day | Status: DC

## 2019-12-22 MED ORDER — ACETAMINOPHEN 325 MG PO TABS
650.0000 mg | ORAL_TABLET | ORAL | Status: DC | PRN
  Administered 2019-12-22: 650 mg via ORAL
  Filled 2019-12-22: qty 2

## 2019-12-22 MED ORDER — WARFARIN SODIUM 2 MG PO TABS
2.0000 mg | ORAL_TABLET | Freq: Once | ORAL | Status: AC
  Administered 2019-12-22: 2 mg via ORAL
  Filled 2019-12-22: qty 1

## 2019-12-22 MED ORDER — HEPARIN SODIUM (PORCINE) 1000 UNIT/ML IJ SOLN
INTRAMUSCULAR | Status: DC | PRN
  Administered 2019-12-22: 8000 [IU] via INTRAVENOUS

## 2019-12-22 MED ORDER — SODIUM CHLORIDE 0.9 % IV SOLN: 250.0000 mL | INTRAVENOUS | Status: DC | PRN

## 2019-12-22 SURGICAL SUPPLY — 18 items
BALLN STERLING OTW 3X220X150 (BALLOONS) ×3
BALLOON STERLING OTW 3X220X150 (BALLOONS) ×2 IMPLANT
CATH CXI 2.6F 90 ANG (CATHETERS) ×1
CATH OMNI FLUSH 5F 65CM (CATHETERS) ×3 IMPLANT
CATH SPRT ANG 90X2.3FR ACPT (CATHETERS) ×2 IMPLANT
DEVICE CLOSURE MYNXGRIP 5F (Vascular Products) ×3 IMPLANT
KIT MICROPUNCTURE NIT STIFF (SHEATH) ×3 IMPLANT
KIT PV (KITS) ×3 IMPLANT
PATCH THROMBIX TOPICAL PLAIN (HEMOSTASIS) ×6 IMPLANT
SHEATH GLIDE SLENDER 4/5FR (SHEATH) ×3 IMPLANT
SHEATH MICROPUNCTURE PEDAL 4FR (SHEATH) ×3 IMPLANT
SHEATH PINNACLE 5F 10CM (SHEATH) ×3 IMPLANT
SHEATH PROBE COVER 6X72 (BAG) ×3 IMPLANT
SYR MEDRAD MARK V 150ML (SYRINGE) ×3 IMPLANT
TRANSDUCER W/STOPCOCK (MISCELLANEOUS) ×3 IMPLANT
TRAY PV CATH (CUSTOM PROCEDURE TRAY) ×3 IMPLANT
WIRE BENTSON .035X145CM (WIRE) ×3 IMPLANT
WIRE G V18X300CM (WIRE) ×3 IMPLANT

## 2019-12-22 NOTE — Progress Notes (Signed)
Mobility Specialist - Progress Note   12/22/19 1531  Mobility  Activity Dangled on edge of bed;Stood at bedside  Level of Assistance Independent  Assistive Device None  Mobility Response Tolerated well  Mobility performed by Mobility specialist  $Mobility charge 1 Mobility    Pre-mobility: 107 HR, 127/ 80 BP, 97% SpO2 Sitting-up: 106 HR, 121/80 BP, 100% SpO2  Pt was able to briefly stand at the bedside, but said his foot felt too tender to progress mobility. He stated he wanted to sit up on the edge of the bed for a while. Pt stated it felt good to sit up.   Pricilla Handler Mobility Specialist Mobility Specialist Phone: [phone]

## 2019-12-22 NOTE — Progress Notes (Signed)
[facility] for IV Heparin Indication:  Mechanical heart valve, atrial fibrillation  Allergies  Allergen Reactions  . Statins Other (See Comments)    Other reaction(s): Arthralgias (intolerance), Myalgias (intolerance)  Pt taking pravastatin   . Sertraline Rash    Patient Measurements: Total Body Weight: 78.9 kg Height: 70 inches Heparin Dosing Weight: 78.9 kg  Labs: Recent Labs    12/20/19 0000 12/21/19 1639 12/22/19 0333  HGB  --  12.0*  --   HCT  --  40.2  --   PLT  --  82*  --   LABPROT  --  24.3*  --   INR 2.9 2.3*  --   HEPARINUNFRC  --   --  0.26*  CREATININE  --  4.18*  --     Estimated Creatinine Clearance: 18.9 mL/min (A) (by C-G formula based on SCr of 4.18 mg/dL (H)).  Assessment: 64 yr old male was admitted today with critical limb ischemia with tissue loss in RLE for at least 1 month, with progression of tissue loss in R foot. VVS plans R leg arteriogram and possible intervention this admission (tentatively planned for Thursday). Pharmacy was consulted to dose heparin (warfarin on hold for pending procedures).  Pt is on warfarin at home for atrial fibrillation and mechanical aortic valve. Per 12/26/19 anticoagulation visit: warfarin regimen is 1 mg PO daily, INR goal 2.5-3.5. INR at 0000 on 9/14 was 2.9; pt stated that he last took warfarin on 9/13 PM. Plan to start heparin infusion when INR <2.5.  Heparin level subtherapeutic (0.26) on gtt at 1250 units/hr. No issues with line or bleeding reported per RN. Plan for arteriogram and intervention for R foot ulceration today at 0730.  Goal of Therapy:  Heparin level 0.3-0.7 units/ml Monitor platelets by anticoagulation protocol: Yes   Plan:  Increase heparin infusion to 1350 units/hr  F/u post arteriogram  Sherlon Handing, PharmD, BCPS Please see amion for complete clinical pharmacist phone list 12/22/2019,4:44 AM

## 2019-12-22 NOTE — Progress Notes (Signed)
Pt received from cath lab to 4e08. VSS. Call bell in reach. Will continue to monitor.  Arletta Bale, RN

## 2019-12-22 NOTE — Progress Notes (Signed)
    S/P angiogram with intervention left LE by Dr. Gean Maidens Coumadin per pharmacy tonight for mechanical valve. Currently on heparin Called Nephrology for HD MWF Dr. Joylene Grapes  Will plan for discharge once INR in therapeutic 2.5-3.5  Roxy Horseman PA-C

## 2019-12-22 NOTE — Consult Note (Addendum)
[facility] Renal Consultation Note    Indication for Consultation:  Management of ESRD/hemodialysis, anemia, hypertension/volume, and secondary hyperparathyroidism.  HPI: Jeremy [name] is a 64 y.o. male with PMH including ESRD on hemodialysis at [facility], CAD, mechanical heart valve on warfarin, a. Fib, HFrEF, and PAD who is admitted to [facility] for R leg arteriogram. Pt reports it went well. Nephrology has been consulted for management of ESRD during admission.  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 He had a bilateral nephrectomy for renal cell carcinoma in 2014. He tried PD twice in the past but reports his clearances were not to goal, and he prefers HD. He has a LUE AVF for access. Reports EDW is 80kg and he takes sensipar 30mg  daily and renvela 4 tabs PO per meal. He also takes midodrine 10mg  pre-HD. His last dialysis was 12/21/19 and pt reports it was completed without any issues. He denies SOB, CP, palpitations, dizziness, abdominal pain, N/V, edema. Reports he has intermittent diarrhea chronically. Labs notable for K+ 5.0, Cr 5.28, BUN 22, Ca 8.9, Hgb 11.7, INR 1.6.   Past Medical History:  Diagnosis Date  . Abnormal stress test 03/19/2018  . 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 Aortic stenosis    a. 2014 s/p mech AVR ([facility])-->chronic coumadin; b. 08/2018 Echo: Mild AS/AI. Nl fxn/gradient.  . Arthritis   . Atrial fibrillation ([place])   . CAD (coronary artery disease)    a. 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 02/2018 MV [facility]): large inf, infsept, inflat infarct w/ min rev, EF 30%; c. 06/2018 Cath: LM min irregs, LAD 60p, 95/30/47m, LCX min irregs, RCA 40p, 84m, 90d, RPAV 90, RPDA 95ost; d. 09/2018 CABGx3 (LIMA->LAD, VG->D1, VG->PDA).  . Chronic anticoagulation 06/2012   a. Coumadin in the setting of mech AVR and afib.  Marland Kitchen COVID-19  03/15/2019  . CPAP (continuous positive airway pressure) dependence   . Dialysis patient [facility]) 04/07/2006   Patient started HD around 1996,  had 1st transplant LLQ around 1998 until 2007 when they found renal cell cancer in transplant and both native kidneys, all were removed > went back on HD until 2nd transplant RLQ in 2014 which lasted 3 yrs; it was embolized for suspected acute rejection. Is currently on HD MWF in [place] w/ [facility] nephrology.  . ESRD (end stage renal disease) ([place])    a. CKD 2/2 to IgA nephropathy (dx age 78); b. 1999 s/p Kidney transplant; c. 2014 Bilat nephrectomy (transplanted kidney resected) in the setting of renal cell carcinoma; d. PD until 11/2017-->HD.  Marland Kitchen Heart murmur   . HFrEF (heart failure with reduced ejection fraction) ([place])    a. 07/2016 Echo: EF 55-60%; b. 02/2018 MV: EF 30%; c. 04/2018 Echo: EF 35%; d. 08/2018 Echo: EF 30-35%. Glob HK w/o rwma. Mildly reduced RV fxn. Mod to sev dil LA. Nl AoV fxn.  Marland Kitchen Hx of colonoscopy    2009 by Dr. Laural Golden. Normal except for small submucosal lipoma. No evidence of polyps or colitis.  . Hyperlipidemia   . Hypertension   . Hypogonadism in male 07/25/2015  . Ischemic cardiomyopathy    a. 07/2016 Echo: EF 55-60%; b. 02/2018 MV: EF 30%; c. 04/2018 Echo: EF 35%; d. 08/2018 Echo: EF 30-35%.  . OSA (obstructive sleep apnea)    No CPAP  . Permanent atrial fibrillation (  [place])    a. CHA2DS2VASc = 3-->Coumadin (also mech AVR); b. 06/2018 Amio started 2/2 elevated rates.  . Renal cell cancer ([place])   . Renal cell carcinoma ([place]) 08/04/2016   Past Surgical History:  Procedure Laterality Date  . ABDOMINAL AORTOGRAM W/LOWER EXTREMITY N/A 12/22/2019   Procedure: ABDOMINAL AORTOGRAM W/LOWER EXTREMITY;  Surgeon: Marty Heck, MD;  Location: [place] CV [facility];  Service: Cardiovascular;  Laterality: N/A;  . AORTIC VALVE REPLACEMENT  3 /11 /2014   At [place]    . CAPD INSERTION N/A  02/19/2017   Procedure: LAPAROSCOPIC INSERTION CONTINUOUS AMBULATORY PERITONEAL DIALYSIS  (CAPD) CATHETER;  Surgeon: Algernon Huxley, MD;  Location: [facility] ORS;  Service: General;  Laterality: N/A;  . CORONARY ANGIOPLASTY WITH STENT PLACEMENT    . CORONARY ARTERY BYPASS GRAFT N/A 09/16/2018   Procedure: CORONARY ARTERY BYPASS GRAFT times three      with left internal mammary artery and endoharvest of right leg greater saphenous vein;  Surgeon: Melrose Nakayama, MD;  Location: [place];  Service: Open Heart Surgery;  Laterality: N/A;  . CORONARY STENT INTERVENTION N/A 07/15/2019   Procedure: CORONARY STENT INTERVENTION;  Surgeon: Wellington Hampshire, MD;  Location: [place] [facility];  Service: Cardiovascular;  Laterality: N/A;  . DG AV DIALYSIS  SHUNT ACCESS EXIST*L* OR     left arm  . EPIGASTRIC HERNIA REPAIR N/A 02/19/2017   Procedure: HERNIA REPAIR EPIGASTRIC ADULT;  Surgeon: Robert Bellow, MD;  Location: [facility] ORS;  Service: General;  Laterality: N/A;  . EYE SURGERY     bilateral cataract  . HERNIA REPAIR     UMBILICAL HERNIA  . HERNIA REPAIR  02/19/2017  . INNER EAR SURGERY     20 years ago  . KIDNEY TRANSPLANT    . LEFT HEART CATH AND CORS/GRAFTS ANGIOGRAPHY N/A 07/15/2019   Procedure: LEFT HEART CATH AND CORS/GRAFTS ANGIOGRAPHY;  Surgeon: Wellington Hampshire, MD;  Location: [place] [facility];  Service: Cardiovascular;  Laterality: N/A;  . LOWER EXTREMITY ANGIOGRAPHY Right 08/25/2019   Procedure: LOWER EXTREMITY ANGIOGRAPHY;  Surgeon: Algernon Huxley, MD;  Location: [place] CV LAB;  Service: Cardiovascular;  Laterality: Right;  . LOWER EXTREMITY ANGIOGRAPHY Left 09/29/2019   Procedure: Lower Extremity Angiography;  Surgeon: Algernon Huxley, MD;  Location: [place] CV LAB;  Service: Cardiovascular;  Laterality: Left;  . PERIPHERAL VASCULAR INTERVENTION Right 12/22/2019   Procedure: PERIPHERAL VASCULAR INTERVENTION;  Surgeon: Marty Heck, MD;  Location: [place] CV LAB;  Service:  Cardiovascular;  Laterality: Right;  . Peritoneal Dialysis access placement  02/19/2017  . PSEUDOANERYSM COMPRESSION N/A 09/29/2019   Procedure: PSEUDOANERYSM COMPRESSION;  Surgeon: Algernon Huxley, MD;  Location: [place] [facility];  Service: Cardiovascular;  Laterality: N/A;  . REMOVAL OF A DIALYSIS CATHETER N/A 11/23/2017   Procedure: REMOVAL OF A PERITONEAL DIALYSIS CATHETER;  Surgeon: Algernon Huxley, MD;  Location: [facility];  Service: Vascular;  Laterality: N/A;  . RIGHT/LEFT HEART CATH AND CORONARY ANGIOGRAPHY Bilateral 06/07/2018   Procedure: RIGHT/LEFT HEART CATH AND CORONARY ANGIOGRAPHY;  Surgeon: Wellington Hampshire, MD;  Location: [place] [facility];  Service: Cardiovascular;  Laterality: Bilateral;  . TEE WITHOUT CARDIOVERSION N/A 07/21/2016   Procedure: TRANSESOPHAGEAL ECHOCARDIOGRAM (TEE);  Surgeon: Wellington Hampshire, MD;  Location: [facility];  Service: Cardiovascular;  Laterality: N/A;  . TEE WITHOUT CARDIOVERSION N/A 09/16/2018   Procedure: TRANSESOPHAGEAL ECHOCARDIOGRAM (TEE);  Surgeon: Modesto Charon  C, MD;  Location: [place];  Service: Open Heart Surgery;  Laterality: N/A;   Family History  Problem Relation Age of Onset  . Cancer Mother        pancreatic  . Heart disease Father   . Diabetes Father    Social History:  reports that he has never smoked. He has never used smokeless tobacco. He reports that he does not drink alcohol and does not use drugs.  ROS: As per HPI otherwise negative.  Physical Exam: Vitals:   12/22/19 0902 12/22/19 0907 12/22/19 0924 12/22/19 1129  BP: (!) 137/91 (!) 135/93 131/79 128/84  Pulse: (!) 106 (!) 123 (!) 107 (!) 104  Resp: 14 (!) 0 16 17  Temp:   98.3 F (36.8 C) 98.2 F (36.8 C)  TempSrc:   Oral Oral  SpO2: 99% 96% 94% 95%     General: Well developed, well nourished, in no acute distress. Head: Normocephalic, atraumatic, sclera non-icteric, mucus membranes are moist. Neck: JVD not elevated. Lungs: Clear bilaterally to auscultation  without wheezes, rales, or rhonchi. Breathing is unlabored on RA Heart: RRR with normal S1, S2. No murmurs, rubs, or gallops appreciated. Abdomen: Soft, non-tender, non-distended with normoactive bowel sounds. No rebound/guarding. No obvious abdominal masses. Lower extremities: No pitting edema b/l lower extremities Neuro: Alert and oriented X 3. Moves all extremities spontaneously. Psych:  Responds to questions appropriately with a normal affect. Dialysis Access: LUE AVF + bruit  Allergies  Allergen Reactions  . Statins Other (See Comments)    Other reaction(s): Arthralgias (intolerance), Myalgias (intolerance)  Pt taking pravastatin   . Sertraline Rash   Prior to Admission medications   Medication Sig Start Date End Date Taking? Authorizing Provider  acetaminophen (TYLENOL) 500 MG tablet Take 1,000 mg 2 (two) times daily as needed by mouth for moderate pain.   Yes [provider]  allopurinol (ZYLOPRIM) 300 MG tablet Take 300 mg by mouth daily. 12/23/17  Yes [provider]  azelastine (ASTELIN) 0.1 % nasal spray Place 1 spray into both nostrils 2 (two) times daily. Use in each nostril as directed 12/06/19  Yes Martyn Ehrich, NP  benzonatate (TESSALON) 200 MG capsule Take 1 capsule (200 mg total) by mouth 3 (three) times daily as needed for cough. 12/06/19  Yes Martyn Ehrich, NP  cholecalciferol (VITAMIN D3) 25 MCG (1000 UNIT) tablet Take 1,000 Units by mouth daily.   Yes [provider]  cinacalcet (SENSIPAR) 30 MG tablet Take 30 mg by mouth daily.    Yes [provider]  clopidogrel (PLAVIX) 75 MG tablet Take 1 tablet (75 mg total) by mouth daily with breakfast. 07/29/19  Yes Wellington Hampshire, MD  Epoetin Alfa-epbx (RETACRIT IJ) Epoetin alfa - epbx (Retacrit) 09/19/19 09/17/20 Yes [provider]  ezetimibe (ZETIA) 10 MG tablet Take 1 tablet (10 mg total) by mouth daily. 04/29/19 12/22/19 Yes Dunn, Areta Haber, PA-C  hydroxychloroquine  (PLAQUENIL) 200 MG tablet Take 200 mg by mouth 2 (two) times daily. 09/01/19  Yes [provider]  lidocaine-prilocaine (EMLA) cream Apply 1 application topically as needed (for fistula). Monday, Wednesday, Friday for dialysis   Yes [provider]  loratadine (CLARITIN) 10 MG tablet Take 10 mg by mouth daily.   Yes [provider]  metoprolol tartrate (LOPRESSOR) 100 MG tablet Take 1 tablet (100 mg total) by mouth 2 (two) times daily. Patient to call when refill needed. 11/24/19 02/22/20 Yes Loel Dubonnet, NP  midodrine (PROAMATINE) 10 MG  tablet Take 10 mg by mouth 3 (three) times a week. Jory Sims, Friday 12/15/19  Yes [provider]  multivitamin (RENA-VIT) TABS tablet Take 1 tablet by mouth daily. 02/23/19  Yes [provider]  nitroGLYCERIN (NITROSTAT) 0.4 MG SL tablet Place 1 tablet (0.4 mg total) under the tongue every 5 (five) minutes as needed for chest pain. 06/24/19 12/22/19 Yes Loel Dubonnet, NP  pantoprazole (PROTONIX) 40 MG tablet Take 1 tablet (40 mg total) by mouth daily. 07/29/19  Yes Wellington Hampshire, MD  predniSONE (DELTASONE) 1 MG tablet Take 4 mg by mouth daily with breakfast.   Yes Grey, Erin A, DO  rosuvastatin (CRESTOR) 40 MG tablet Take 1 tablet (40 mg total) by mouth daily at 6 PM. 07/29/19  Yes Wellington Hampshire, MD  sevelamer carbonate (RENVELA) 800 MG tablet Take 3,200 mg by mouth 3 (three) times daily with meals. 4 tabs per meal   Yes [provider]  silver sulfADIAZINE (SILVADENE) 1 % cream Apply 1 application topically daily. Apply to right great toe and heel   Yes [provider]  traZODone (DESYREL) 50 MG tablet TAKE 1/2 -1 TABLET (25-50 MG TOTAL) BY MOUTH AT BEDTIME AS NEEDED FOR SLEEP. Patient taking differently: Take 50 mg by mouth at bedtime as needed for sleep.  11/10/19  Yes Leone Haven, MD  warfarin (COUMADIN) 1 MG tablet Take 1 mg by mouth daily.   Yes [provider]  zinc sulfate  220 (50 Zn) MG capsule Take 220 mg by mouth daily.   Yes [provider]   Current Facility-Administered Medications  Medication Dose Route Frequency Provider Last Rate Last Admin  . 0.9 %  sodium chloride infusion  250 mL Intravenous PRN Marty Heck, MD      . acetaminophen (TYLENOL) suppository 325-650 mg  325-650 mg Rectal Q4H PRN Marty Heck, MD      . acetaminophen (TYLENOL) tablet 650 mg  650 mg Oral Q4H PRN Marty Heck, MD      . allopurinol (ZYLOPRIM) tablet 300 mg  300 mg Oral Daily Marty Heck, MD   300 mg at 12/22/19 0941  . bisacodyl (DULCOLAX) suppository 10 mg  10 mg Rectal Daily PRN Marty Heck, MD      . clopidogrel (PLAVIX) tablet 75 mg  75 mg Oral Q breakfast Marty Heck, MD   75 mg at 12/22/19 0942  . guaiFENesin-dextromethorphan (ROBITUSSIN DM) 100-10 MG/5ML syrup 15 mL  15 mL Oral Q4H PRN Marty Heck, MD      . heparin ADULT infusion 100 units/mL (25000 units/257mL sodium chloride 0.45%)  1,350 Units/hr Intravenous Continuous Marty Heck, MD      . hydrALAZINE (APRESOLINE) injection 5 mg  5 mg Intravenous Q20 Min PRN Marty Heck, MD      . HYDROmorphone (DILAUDID) injection 0.5 mg  0.5 mg Intravenous Q4H PRN Marty Heck, MD      . hydroxychloroquine (PLAQUENIL) tablet 200 mg  200 mg Oral BID Marty Heck, MD   200 mg at 12/22/19 0941  . labetalol (NORMODYNE) injection 10 mg  10 mg Intravenous Q10 min PRN Marty Heck, MD      . metoprolol tartrate (LOPRESSOR) injection 2-5 mg  2-5 mg Intravenous Q2H PRN Marty Heck, MD      . metoprolol tartrate (LOPRESSOR) tablet 100 mg  100 mg Oral BID Marty Heck, MD   100 mg at 12/22/19  0941  . ondansetron (ZOFRAN) injection 4 mg  4 mg Intravenous Q6H PRN Marty Heck, MD      . oxyCODONE-acetaminophen (PERCOCET/ROXICET) 5-325 MG per tablet 1-2 tablet  1-2 tablet Oral Q4H PRN Marty Heck, MD       . pantoprazole (PROTONIX) EC tablet 40 mg  40 mg Oral Daily Marty Heck, MD   40 mg at 12/22/19 0941  . phenol (CHLORASEPTIC) mouth spray 1 spray  1 spray Mouth/Throat PRN Marty Heck, MD      . polyethylene glycol (MIRALAX / GLYCOLAX) packet 17 g  17 g Oral Daily PRN Marty Heck, MD      . potassium chloride SA (KLOR-CON) CR tablet 20-40 mEq  20-40 mEq Oral Once Marty Heck, MD      . rosuvastatin (CRESTOR) tablet 40 mg  40 mg Oral q1800 Marty Heck, MD      . sevelamer carbonate (RENVELA) tablet 4,000 mg  4,000 mg Oral TID WC Marty Heck, MD      . sodium chloride flush (NS) 0.9 % injection 3 mL  3 mL Intravenous Q12H Marty Heck, MD   3 mL at 12/22/19 1030  . sodium chloride flush (NS) 0.9 % injection 3 mL  3 mL Intravenous PRN Marty Heck, MD      . warfarin (COUMADIN) tablet 2 mg  2 mg Oral Once Marty Heck, MD      . Warfarin - Pharmacist Dosing Inpatient   Does not apply q1600 Marty Heck, MD       Labs: Basic Metabolic Panel: Recent Labs  Lab 12/21/19 1639 12/22/19 0333  NA 138 137  K 3.9 5.0  CL 94* 95*  CO2 33* 26  GLUCOSE 110* 82  BUN 13 22  CREATININE 4.18* 5.28*  CALCIUM 8.7* 8.9   Liver Function Tests: Recent Labs  Lab 12/21/19 1639  AST 29  ALT 25  ALKPHOS 148*  BILITOT 0.9  PROT 6.9  ALBUMIN 3.4*   CBC: Recent Labs  Lab 12/21/19 1639 12/22/19 0650  WBC 5.0 5.1  HGB 12.0* 11.7*  HCT 40.2 38.5*  MCV 106.9* 108.1*  PLT 82* 75*   Studies/Results: PERIPHERAL VASCULAR CATHETERIZATION  Result Date: 12/22/2019 Patient name: Jeremy [name]   MRN: [id number]        DOB: 29-Jun-1955          Sex: male  12/22/2019 Pre-operative Diagnosis: Critical limb ischemia of the right lower extremity with tissue loss (right great toe ulceration and gangrene at the toe tip) Post-operative diagnosis:  Same Surgeon:  Marty Heck, MD Procedure Performed: 1.  Ultrasound-guided  access of the left common femoral artery 2.  Aortogram including catheter selection of aorta 3.  Right lower extremity arteriogram with selection of second-order branches 4.  Ultrasound-guided access of the right posterior tibial artery at the ankle retrograde 5.  Right posterior tibial artery and tibioperoneal trunk balloon angioplasty from retrograde approach (3 mm x 220 mm Sterling) 6.  Mynx closure of the left common femoral artery 7.  52 minutes of monitored moderate conscious sedation time  Indications: 64 year old male with multiple medical comorbidities including mechanical aortic valve on Coumadin as well as end-stage renal disease and diabetes that has had previous right lower extremity and left groin interventions at [place].  He presented to [name] for a second opinion after having previous tibial interventions in the right lower extremity complicated by pseudoaneurysm in the left groin  that was treated with thrombin injection and covered stenting at [place].  He was admitted for reversal of his Coumadin given mechanical aortic valve.  Presents today for right leg intervention after risk benefits discussed.  Findings:  Aortogram showed no flow-limiting stenosis in the aortoiliac segment although severely calcified.  Right lower extremity arteriogram showed a patent although calcified and diseased common femoral, profunda, SFA, above and below-knee popliteal artery without flow-limiting stenosis.  Anterior tibial artery occluded shortly after takeoff.  He had a patent tibioperoneal trunk with a single vessel runoff via a patent peroneal down to the mid to distal calf where it occludes.  Previous posterior tibial artery intervention is occluded and there is no stump proximally for antegrade intervention and only a distal vessel at the ankle that reconstituted.  Ultimately after evaluating the images, I thought the only option was retrograde approach.  The right foot was prepped and draped and then we  accessed the right posterior tibial retrograde at the ankle and placed a slender 4/5 French sheath and treated the entire posterior tibial and TP trunk with 3 mm angioplasty balloon with retrograde appoach.  He now has inline flow down the right lower extremity.             Procedure:  The patient was identified in the holding area and taken to room 8.  The patient was then placed supine on the table and prepped and draped in the usual sterile fashion.  A time out was called.  Ultrasound was used to evaluate the left common femoral artery.  It was patent .  A digital ultrasound image was acquired.  A micropuncture needle was used to access the left common femoral artery under ultrasound guidance above his Viabhans stents.  An 018 wire was advanced without resistance and a micropuncture sheath was placed.  The 018 wire was removed and a benson wire was placed.  The micropuncture sheath was exchanged for a 5 french sheath.  An omniflush catheter was advanced over the wire to the level of L-1.  An abdominal angiogram was obtained.  Next, using the omniflush catheter and a benson wire, the aortic bifurcation was crossed and the catheter was placed into theright external iliac artery and right runoff was obtained.  After evaluating the images felt the patient would only have an option of a retrograde tibial intervention.  The right foot was then prepped and draped in usual sterile fashion.  I used ultrasound evaluated the right posterior tibial artery at the ankle it was patent and image was saved.  This was then accessed under ultrasound guidance retrograde with a micro needle, placed a microwire, and then a micro sheath.  I then used a CXI catheter with a V 18 wire and was able to cross the posterior tibial artery long segment occlusion retrograde and get into the TP trunk and below-knee popliteal artery.  I confirmed on hand-injection through the CXI catheter that I was indeed in the true lumen.  Patient was given 100  units per kilogram heparin.  I then exchanged for a 4/5 French slender sheath in the right posterior tibial artery retrograde.  I then used a long 3 mm Sterling balloon and the entire TP trunk and posterior tibial artery down to the ankle was treated with 3 mm angioplasty balloon to nominal pressure with prolonged inflation time.  Final retrograde injection showed no evidence of dissection with inline flow down the posterior tibial as well as the TP trunk.  Satisfied with  the results wires and catheters were removed.  A mynx closure device was deployed in the left common femoral artery.  Pressure was held and the sheath was removed from the right posterior tibial.  Taken to recovery in stable condition.  Plan: Continue Plavix.  Resume heparin drip in 8 hours without bolus.   Marty Heck, MD [facility] Office: [phone]  VAS [name] ABI WITH/WO TBI  Result Date: 12/20/2019 LOWER EXTREMITY DOPPLER STUDY High Risk Factors: Hypertension, coronary artery disease. Other Factors: ESRD                Left AVF.  Vascular Interventions: 08/25/2019:                         Percutaneous transluminal angioplasty of right                         tibioperoneal trunk and proximal peroneal artery.                         Percutaneous transluminal angioplasty of right posterior                         tibial artery. Performing Technologist: Ronal Fear RVS, RCS  Examination Guidelines: A complete evaluation includes at minimum, Doppler waveform signals and systolic blood pressure reading at the level of bilateral brachial, anterior tibial, and posterior tibial arteries, when vessel segments are accessible. Bilateral testing is considered an integral part of a complete examination. Photoelectric Plethysmograph (PPG) waveforms and toe systolic pressure readings are included as required and additional duplex testing as needed. Limited examinations for reoccurring indications may be  performed as noted.  ABI Findings: +--------+------------------+-----+----------+--------------+ Right   Rt Pressure (mmHg)IndexWaveform  Comment        +--------+------------------+-----+----------+--------------+ [id number]                                             +--------+------------------+-----+----------+--------------+ PTA     65                0.49 monophasicbarely audible +--------+------------------+-----+----------+--------------+ DP                             absent                   +--------+------------------+-----+----------+--------------+ +---------+------------------+-----+--------+----------------+ Left     Lt Pressure (mmHg)IndexWaveformComment          +---------+------------------+-----+--------+----------------+ Brachial                                AVF              +---------+------------------+-----+--------+----------------+ PTA                             biphasicnon-compressible +---------+------------------+-----+--------+----------------+ Great Toe49                0.37                          +---------+------------------+-----+--------+----------------+ +-------+-----------+-----------+------------+------------+ ABI/TBIToday's ABIToday's TBIPrevious ABIPrevious TBI +-------+-----------+-----------+------------+------------+ Right  0.49  bandage    >1.0 [place]     0.93         +-------+-----------+-----------+------------+------------+ Left   >1.3 [place]    0.37       >1.0 [place]     0.86         +-------+-----------+-----------+------------+------------+  Summary: Right: Resting right ankle-brachial index indicates severe right lower extremity arterial disease. Toe index was not obtained due to bandage. Left: Resting left ankle-brachial index indicates noncompressible left lower extremity arteries. The left toe-brachial index is abnormal.  *See table(s) above for measurements and observations.  Electronically signed  by Monica Martinez MD on 12/20/2019 at 1:15:39 PM.    Final    VAS [name] LOWER EXTREMITY ARTERIAL DUPLEX  Result Date: 12/20/2019 LOWER EXTREMITY ARTERIAL DUPLEX STUDY Indications: Peripheral artery disease.  Vascular Interventions: 08/25/2019:                          Percutaneous transluminal angioplasty of right                         tibioperoneal trunk and proximal peroneal artery.                         Percutaneous transluminal angioplasty of right posterior                         tibial artery. Current ABI:            R=0.49, L=[place] Performing Technologist: Ronal Fear RVS, RCS  Examination Guidelines: A complete evaluation includes B-mode imaging, spectral Doppler, color Doppler, and power Doppler as needed of all accessible portions of each vessel. Bilateral testing is considered an integral part of a complete examination. Limited examinations for reoccurring indications may be performed as noted.  +----------+--------+-----+--------+----------+--------+ RIGHT     PSV cm/sRatioStenosisWaveform  Comments +----------+--------+-----+--------+----------+--------+ CFA Distal63                   biphasic           +----------+--------+-----+--------+----------+--------+ DFA       30                   monophasic         +----------+--------+-----+--------+----------+--------+ SFA Prox  72                   biphasic           +----------+--------+-----+--------+----------+--------+ SFA Mid   41                   monophasic         +----------+--------+-----+--------+----------+--------+ SFA Distal32                   monophasic         +----------+--------+-----+--------+----------+--------+ POP Prox  22                   monophasic         +----------+--------+-----+--------+----------+--------+ POP Distal23                   biphasic           +----------+--------+-----+--------+----------+--------+ TP Trunk  31                   biphasic            +----------+--------+-----+--------+----------+--------+  ATA Distal0                    occluded?          +----------+--------+-----+--------+----------+--------+ PTA Distal35                   monophasic         +----------+--------+-----+--------+----------+--------+  Summary: Right: No evidence of hemodynamically significant stenosis observed within the common femoral artery, superficial femoral artery or popliteal artery. The anterior tibial artery appears occluded throughout its course. Monophasic flow obtained in the distal posterior tibial artery, unable to obtain Doppler signal or color flow in the proximal and mid segment. Biphasic/monophasic flow obtained in the tibioperoneal trunk.  See table(s) above for measurements and observations. Electronically signed by Monica Martinez MD on 12/20/2019 at 1:18:28 PM.    Final     Dialysis Orders: [facility] on MWF. Per pt: EDW 80kg, LUE AVF, sensipar 30mg  daily, renvela 4 tabs PO per meal Will request HD records  Assessment/Plan: 1.  PAD: S/p angiogram with intervention on 12/21/19, per vascular 2.  ESRD:  K+ controlled, no absolute indication for HD today. Will plan for dialysis tomorrow, continue MWF schedule. Requesting records from [place].  3.  Hypertension/volume: BP controlled, no volume overload on exam. Pt believes his EDW is 80kg which he is below today. Takes midodrine before dialysis. Will plan for UF goal 1-1.5L with Hd tomorrow, midodrine ordered. 4.  Anemia: Hgb 11.7. No indication for ESA at this time.  5.  Metabolic bone disease: Calcium at goal. Will check phos with AM labs tomorrow. Continue sensipar and renvela.  6.  Mechanical heart valve: On warfarin 7. A.fib: Rate controlled, on metoprolol and warfarin as above  Anice Paganini, PA-C 12/22/2019, 11:43 AM  [facility] Pager: [phone]

## 2019-12-22 NOTE — Progress Notes (Addendum)
[place] for IV Heparin to warfarin Indication:  Mechanical heart valve, atrial fibrillation  Allergies  Allergen Reactions  . Statins Other (See Comments)    Other reaction(s): Arthralgias (intolerance), Myalgias (intolerance)  Pt taking pravastatin   . Sertraline Rash    Patient Measurements: Total Body Weight: 78.9 kg Height: 70 inches Heparin Dosing Weight: 78.9 kg  Labs: Recent Labs    12/21/19 1639 12/22/19 0333 12/22/19 0650  HGB 12.0*  --  11.7*  HCT 40.2  --  38.5*  PLT 82*  --  75*  LABPROT 24.3* 20.0* 18.8*  INR 2.3* 1.8* 1.6*  HEPARINUNFRC  --  0.26*  --   CREATININE 4.18* 5.28*  --     Estimated Creatinine Clearance: 15 mL/min (A) (by C-G formula based on SCr of 5.28 mg/dL (H)).  Assessment: 64 yr old male was admitted today with critical limb ischemia with tissue loss in RLE for at least 1 month, with progression of tissue loss in R foot. VVS plans R leg arteriogram and possible intervention this admission (tentatively planned for Thursday). Pharmacy was consulted to manage heparin and now to restart warfarin s/p procedures. S/p FFP x 2.  Pt on warfarin PTA for atrial fibrillation and mechanical aortic valve. Last dose of warfarin on 9/13 PM. INR this morning is 1.6  PTA warfarin dose: 1 mg PO daily (per 9/20 anti-coag visit)  Heparin level was subtherapeutic (0.26) on gtt at 1250 units/hr. Now s/p arteriogram and intervention for R foot ulceration today.  Goal of Therapy:  INR goal 2.5-3.5 Heparin level 0.3-0.7 units/ml Monitor platelets by anticoagulation protocol: Yes   Plan:  Restart heparin infusion at 1350 units/hr @ 1700 9/16 Check anti-Xa level in 8 hours and daily while on heparin Continue to monitor H&H and platelets  Give warfarin 2 mg PO x 1 Monitor daily INR, CBC, clinical course, s/sx of bleed, PO intake, DDI    Thank you for allowing [name] to participate in this patients care.   Jens Som, PharmD Please see amion for complete clinical pharmacist phone list. 12/22/2019 10:10 AM

## 2019-12-22 NOTE — Op Note (Signed)
Patient name: Jeremy [name] MRN: [id number] DOB: 07-Feb-1956 Sex: male  12/22/2019 Pre-operative Diagnosis: Critical limb ischemia of the right lower extremity with tissue loss (right great toe ulceration and gangrene at the toe tip) Post-operative diagnosis:  Same Surgeon:  Marty Heck, MD Procedure Performed: 1.  Ultrasound-guided access of the left common femoral artery 2.  Aortogram including catheter selection of aorta 3.  Right lower extremity arteriogram with selection of second-order branches 4.  Ultrasound-guided access of the right posterior tibial artery at the ankle retrograde 5.  Right posterior tibial artery and tibioperoneal trunk balloon angioplasty from retrograde approach (3 mm x 220 mm Sterling) 6.  Mynx closure of the left common femoral artery 7.  52 minutes of monitored moderate conscious sedation time  Indications: 64 year old male with multiple medical comorbidities including mechanical aortic valve on Coumadin as well as end-stage renal disease and diabetes that has had previous right lower extremity and left groin interventions at [place].  He presented to [name] for a second opinion after having previous tibial interventions in the right lower extremity complicated by pseudoaneurysm in the left groin that was treated with thrombin injection and covered stenting at [place].  He was admitted for reversal of his Coumadin given mechanical aortic valve.  Presents today for right leg intervention after risk benefits discussed.  Findings:   Aortogram showed no flow-limiting stenosis in the aortoiliac segment although severely calcified.  Right lower extremity arteriogram showed a patent although calcified and diseased common femoral, profunda, SFA, above and below-knee popliteal artery without flow-limiting stenosis.  Anterior tibial artery occluded shortly after takeoff.  He had a patent tibioperoneal trunk with a single vessel runoff via a patent peroneal down to the  mid to distal calf where it occludes.  Previous posterior tibial artery intervention is occluded and there is no stump proximally for antegrade intervention and only a distal vessel at the ankle that reconstituted.  Ultimately after evaluating the images, I thought the only option was retrograde approach.  The right foot was prepped and draped and then we accessed the right posterior tibial retrograde at the ankle and placed a slender 4/5 French sheath and treated the entire posterior tibial and TP trunk with 3 mm angioplasty balloon with retrograde appoach.  He now has inline flow down the right lower extremity.   Procedure:  The patient was identified in the holding area and taken to room 8.  The patient was then placed supine on the table and prepped and draped in the usual sterile fashion.  A time out was called.  Ultrasound was used to evaluate the left common femoral artery.  It was patent .  A digital ultrasound image was acquired.  A micropuncture needle was used to access the left common femoral artery under ultrasound guidance above his Viabhans stents.  An 018 wire was advanced without resistance and a micropuncture sheath was placed.  The 018 wire was removed and a benson wire was placed.  The micropuncture sheath was exchanged for a 5 french sheath.  An omniflush catheter was advanced over the wire to the level of L-1.  An abdominal angiogram was obtained.  Next, using the omniflush catheter and a benson wire, the aortic bifurcation was crossed and the catheter was placed into theright external iliac artery and right runoff was obtained.  After evaluating the images felt the patient would only have an option of a retrograde tibial intervention.  The right foot was then prepped and draped in  usual sterile fashion.  I used ultrasound evaluated the right posterior tibial artery at the ankle it was patent and image was saved.  This was then accessed under ultrasound guidance retrograde with a micro  needle, placed a microwire, and then a micro sheath.  I then used a CXI catheter with a V 18 wire and was able to cross the posterior tibial artery long segment occlusion retrograde and get into the TP trunk and below-knee popliteal artery.  I confirmed on hand-injection through the CXI catheter that I was indeed in the true lumen.  Patient was given 100 units per kilogram heparin.  I then exchanged for a 4/5 French slender sheath in the right posterior tibial artery retrograde.  I then used a long 3 mm Sterling balloon and the entire TP trunk and posterior tibial artery down to the ankle was treated with 3 mm angioplasty balloon to nominal pressure with prolonged inflation time.  Final retrograde injection showed no evidence of dissection with inline flow down the posterior tibial as well as the TP trunk.  Satisfied with the results wires and catheters were removed.  A mynx closure device was deployed in the left common femoral artery.  Pressure was held and the sheath was removed from the right posterior tibial.  Taken to recovery in stable condition.  Plan: Continue Plavix.  Resume heparin drip in 8 hours without bolus.   Marty Heck, MD [facility] [place] [place] Office: [phone]

## 2019-12-22 NOTE — Progress Notes (Signed)
[facility] [place]  Subjective  - no events overnight.  Got 2 units FFP to reverse INR.   Objective 119/77 (!) 110 98.5 F (36.9 C) (Oral) 18 97%  Intake/Output Summary (Last 24 hours) at 12/22/2019 0803 Last data filed at 12/22/2019 0457 Gross per 24 hour  Intake 639 ml  Output --  Net 639 ml    Right great toe ulcer  Laboratory Lab Results: Recent Labs    12/21/19 1639 12/22/19 0650  WBC 5.0 5.1  HGB 12.0* 11.7*  HCT 40.2 38.5*  PLT 82* 75*   BMET Recent Labs    12/21/19 1639 12/22/19 0333  NA 138 137  K 3.9 5.0  CL 94* 95*  CO2 33* 26  GLUCOSE 110* 82  BUN 13 22  CREATININE 4.18* 5.28*  CALCIUM 8.7* 8.9    COAG Lab Results  Component Value Date   INR 1.6 (H) 12/22/2019   INR 1.8 (H) 12/22/2019   INR 2.3 (H) 12/21/2019   No results found for: PTT  Assessment/Planning:  Right leg arteriogram and possible intervention.  CLI with tissue loss.  Risks and benefits discussed.   Marty Heck 12/22/2019 8:03 AM --

## 2019-12-23 ENCOUNTER — Telehealth: Payer: Self-pay | Admitting: Primary Care

## 2019-12-23 LAB — PROTIME-INR
INR: 1.6 — ABNORMAL HIGH (ref 0.8–1.2)
Prothrombin Time: 18.3 seconds — ABNORMAL HIGH (ref 11.4–15.2)

## 2019-12-23 LAB — PREPARE FRESH FROZEN PLASMA

## 2019-12-23 LAB — BPAM FFP
Blood Product Expiration Date: 202109202359
Blood Product Expiration Date: 202109202359
ISSUE DATE / TIME: 202109160051
ISSUE DATE / TIME: 202109160301
Unit Type and Rh: 5100
Unit Type and Rh: 5100

## 2019-12-23 LAB — RENAL FUNCTION PANEL
Albumin: 3.2 g/dL — ABNORMAL LOW (ref 3.5–5.0)
Anion gap: 13 (ref 5–15)
BUN: 37 mg/dL — ABNORMAL HIGH (ref 8–23)
CO2: 27 mmol/L (ref 22–32)
Calcium: 8.8 mg/dL — ABNORMAL LOW (ref 8.9–10.3)
Chloride: 96 mmol/L — ABNORMAL LOW (ref 98–111)
Creatinine, Ser: 7.45 mg/dL — ABNORMAL HIGH (ref 0.61–1.24)
GFR calc Af Amer: 8 mL/min — ABNORMAL LOW (ref >60)
GFR calc non Af Amer: 7 mL/min — ABNORMAL LOW (ref >60)
Glucose, Bld: 79 mg/dL (ref 70–99)
Phosphorus: 5 mg/dL — ABNORMAL HIGH (ref 2.5–4.6)
Potassium: 5.2 mmol/L — ABNORMAL HIGH (ref 3.5–5.1)
Sodium: 136 mmol/L (ref 135–145)

## 2019-12-23 LAB — CBC
HCT: 37.8 % — ABNORMAL LOW (ref 39.0–52.0)
Hemoglobin: 11.2 g/dL — ABNORMAL LOW (ref 13.0–17.0)
MCH: 31.8 pg (ref 26.0–34.0)
MCHC: 29.6 g/dL — ABNORMAL LOW (ref 30.0–36.0)
MCV: 107.4 fL — ABNORMAL HIGH (ref 80.0–100.0)
Platelets: 74 10*3/uL — ABNORMAL LOW (ref 150–400)
RBC: 3.52 MIL/uL — ABNORMAL LOW (ref 4.22–5.81)
RDW: 17.8 % — ABNORMAL HIGH (ref 11.5–15.5)
WBC: 5.2 10*3/uL (ref 4.0–10.5)
nRBC: 0 % (ref 0.0–0.2)

## 2019-12-23 LAB — HEPARIN LEVEL (UNFRACTIONATED)
Heparin Unfractionated: 0.28 IU/mL — ABNORMAL LOW (ref 0.30–0.70)
Heparin Unfractionated: <0.1 IU/mL — ABNORMAL LOW (ref 0.30–0.70)
Heparin Unfractionated: 0.43 IU/mL (ref 0.30–0.70)

## 2019-12-23 MED ORDER — WARFARIN SODIUM 2 MG PO TABS
2.0000 mg | ORAL_TABLET | Freq: Once | ORAL | Status: AC
  Administered 2019-12-23: 2 mg via ORAL
  Filled 2019-12-23: qty 1

## 2019-12-23 MED ORDER — HEPARIN BOLUS VIA INFUSION
3500.0000 [IU] | Freq: Once | INTRAVENOUS | Status: AC
  Administered 2019-12-23: 3500 [IU] via INTRAVENOUS
  Filled 2019-12-23: qty 3500

## 2019-12-23 MED ORDER — TRAZODONE HCL 50 MG PO TABS
50.0000 mg | ORAL_TABLET | Freq: Every evening | ORAL | Status: DC | PRN
  Administered 2019-12-23 (×2): 50 mg via ORAL
  Filled 2019-12-23 (×2): qty 1

## 2019-12-23 MED FILL — Heparin Sod (Porcine)-NaCl IV Soln 1000 Unit/500ML-0.9%: INTRAVENOUS | Qty: 500 | Status: AC

## 2019-12-23 NOTE — Progress Notes (Signed)
[place] for IV Heparin to warfarin Indication:  Mechanical heart valve, atrial fibrillation  Allergies  Allergen Reactions  . Statins Other (See Comments)    Other reaction(s): Arthralgias (intolerance), Myalgias (intolerance)  Pt taking pravastatin   . Sertraline Rash    Patient Measurements: Total Body Weight: 78.9 kg Height: 70 inches Heparin Dosing Weight: 78.9 kg  Labs: Recent Labs    12/21/19 1639 12/21/19 1639 12/22/19 0333 12/22/19 0333 12/22/19 0650 12/23/19 0216 12/23/19 0858 12/23/19 1302 12/23/19 2131  HGB 12.0*   < >  --   --  11.7* 11.2*  --   --   --   HCT 40.2  --   --   --  38.5* 37.8*  --   --   --   PLT 82*  --   --   --  75* 74*  --   --   --   LABPROT 24.3*   < > 20.0*  --  18.8* 18.3*  --   --   --   INR 2.3*   < > 1.8*  --  1.6* 1.6*  --   --   --   HEPARINUNFRC  --   --  0.26*   < >  --  0.28*  --  <0.10* 0.43  CREATININE 4.18*  --  5.28*  --   --   --  7.45*  --   --    < > = values in this interval not displayed.    Estimated Creatinine Clearance: 10.6 mL/min (A) (by C-G formula based on SCr of 7.45 mg/dL (H)).  Assessment: 64 yr old male on warfarin PTA for atrial fibrillation and mechanical aortic valve. Now s/p R leg arteriogram and intervention. Pharmacy consulted to manage heparin and restart warfarin s/p procedures. S/p FFP x 2.  Heparin drip 1450 uts/hr HL 0.43 at goal   INR remains subtherapeutic at 1.6. CBC stable. No bleeding noted PTA warfarin dose: 1 mg PO daily (per 9/20 anti-coag visit)  Goal of Therapy:  INR goal 2.5-3.5 Heparin level 0.3-0.7 units/ml Monitor platelets by anticoagulation protocol: Yes   Plan:  Continue  heparin infusion at 1450 units/hr Daily heparin level Continue to monitor H&H and platelet   Bonnita Nasuti Pharm.D. CPP, BCPS Clinical Pharmacist [phone] 12/23/2019 10:21 PM    Please see amion for complete clinical pharmacist phone list. 12/23/2019  10:20 PM

## 2019-12-23 NOTE — Progress Notes (Signed)
[place] for IV Heparin to warfarin Indication:  Mechanical heart valve, atrial fibrillation  Allergies  Allergen Reactions  . Statins Other (See Comments)    Other reaction(s): Arthralgias (intolerance), Myalgias (intolerance)  Pt taking pravastatin   . Sertraline Rash    Patient Measurements: Total Body Weight: 78.9 kg Height: 70 inches Heparin Dosing Weight: 78.9 kg  Labs: Recent Labs    12/21/19 1639 12/21/19 1639 12/22/19 0333 12/22/19 0650 12/23/19 0216  HGB 12.0*   < >  --  11.7* 11.2*  HCT 40.2  --   --  38.5* 37.8*  PLT 82*  --   --  75* 74*  LABPROT 24.3*   < > 20.0* 18.8* 18.3*  INR 2.3*   < > 1.8* 1.6* 1.6*  HEPARINUNFRC  --   --  0.26*  --  0.28*  CREATININE 4.18*  --  5.28*  --   --    < > = values in this interval not displayed.    Estimated Creatinine Clearance: 15 mL/min (A) (by C-G formula based on SCr of 5.28 mg/dL (H)).  Assessment: 64 yr old male was admitted today with critical limb ischemia with tissue loss in RLE for at least 1 month, with progression of tissue loss in R foot. VVS plans R leg arteriogram and possible intervention this admission (tentatively planned for Thursday). Pharmacy was consulted to manage heparin and now to restart warfarin s/p procedures. S/p FFP x 2.  Pt on warfarin PTA for atrial fibrillation and mechanical aortic valve. Last dose of warfarin on 9/13 PM. INR this morning is 1.6  PTA warfarin dose: 1 mg PO daily (per 9/20 anti-coag visit)  Heparin level was subtherapeutic (0.28) on gtt at 1350 units/hr. No issues with line or bleeding reported per RN.  Goal of Therapy:  INR goal 2.5-3.5 Heparin level 0.3-0.7 units/ml Monitor platelets by anticoagulation protocol: Yes   Plan:  Increase heparin gtt to 1450 units/hr and f/u 8 hr heparin level  Sherlon Handing, PharmD, BCPS Please see amion for complete clinical pharmacist phone list 12/23/2019 3:33 AM

## 2019-12-23 NOTE — Progress Notes (Addendum)
[facility] Progress Note   Subjective: Seen on HD. No issues with HD.     Objective Vitals:   12/23/19 0726 12/23/19 0738 12/23/19 0800 12/23/19 0830  BP: 130/74 128/74 126/60 (!) 113/47  Pulse: (!) 101 (!) 102 (!) 102 85  Resp: 15     Temp: 97.9 F (36.6 C)     TempSrc: Oral     SpO2: 95%     Weight:       Physical Exam General: Pleasant older male in NAD Heart: irreg, irreg with mechanical click. AFIB on monitor. Rate in 100s.  Lungs: CTAB A/P Abdomen: S, NT, active BS Extremities:No LE edema Dialysis Access: L AVF cannulated   Additional Objective Labs: Basic Metabolic Panel: Recent Labs  Lab 12/21/19 1639 12/22/19 0333  NA 138 137  K 3.9 5.0  CL 94* 95*  CO2 33* 26  GLUCOSE 110* 82  BUN 13 22  CREATININE 4.18* 5.28*  CALCIUM 8.7* 8.9   Liver Function Tests: Recent Labs  Lab 12/21/19 1639  AST 29  ALT 25  ALKPHOS 148*  BILITOT 0.9  PROT 6.9  ALBUMIN 3.4*   No results for input(s): LIPASE, AMYLASE in the last 168 hours. CBC: Recent Labs  Lab 12/21/19 1639 12/22/19 0650 12/23/19 0216  WBC 5.0 5.1 5.2  HGB 12.0* 11.7* 11.2*  HCT 40.2 38.5* 37.8*  MCV 106.9* 108.1* 107.4*  PLT 82* 75* 74*   Blood Culture    Component Value Date/Time   SDES BLOOD BLOOD RIGHT HAND 10/03/2019 1851   SPECREQUEST  10/03/2019 1851    BOTTLES DRAWN AEROBIC AND ANAEROBIC Blood Culture results may not be optimal due to an excessive volume of blood received in culture bottles   CULT  10/03/2019 1851    NO GROWTH 5 DAYS Performed at [facility], [address] [place], [place] [address]    REPTSTATUS 10/08/2019 FINAL 10/03/2019 1851    Cardiac Enzymes: No results for input(s): CKTOTAL, CKMB, CKMBINDEX, TROPONINI in the last 168 hours. CBG: No results for input(s): GLUCAP in the last 168 hours. Iron Studies: No results for input(s): IRON, TIBC, TRANSFERRIN, FERRITIN in the last 72 hours. @lablastinr3 @ Studies/Results: PERIPHERAL  VASCULAR CATHETERIZATION  Result Date: 12/22/2019 Patient name: Jeremy [name]   MRN: [id number]        DOB: 12/14/1955          Sex: male  12/22/2019 Pre-operative Diagnosis: Critical limb ischemia of the right lower extremity with tissue loss (right great toe ulceration and gangrene at the toe tip) Post-operative diagnosis:  Same Surgeon:  Marty Heck, MD Procedure Performed: 1.  Ultrasound-guided access of the left common femoral artery 2.  Aortogram including catheter selection of aorta 3.  Right lower extremity arteriogram with selection of second-order branches 4.  Ultrasound-guided access of the right posterior tibial artery at the ankle retrograde 5.  Right posterior tibial artery and tibioperoneal trunk balloon angioplasty from retrograde approach (3 mm x 220 mm Sterling) 6.  Mynx closure of the left common femoral artery 7.  52 minutes of monitored moderate conscious sedation time  Indications: 64 year old male with multiple medical comorbidities including mechanical aortic valve on Coumadin as well as end-stage renal disease and diabetes that has had previous right lower extremity and left groin interventions at [place].  He presented to [name] for a second opinion after having previous tibial interventions in the right lower extremity complicated by pseudoaneurysm in the left groin that was treated with thrombin injection and  covered stenting at [facility].  He was admitted for reversal of his Coumadin given mechanical aortic valve.  Presents today for right leg intervention after risk benefits discussed.  Findings:  Aortogram showed no flow-limiting stenosis in the aortoiliac segment although severely calcified.  Right lower extremity arteriogram showed a patent although calcified and diseased common femoral, profunda, SFA, above and below-knee popliteal artery without flow-limiting stenosis.  Anterior tibial artery occluded shortly after takeoff.  He had a patent tibioperoneal trunk with a single  vessel runoff via a patent peroneal down to the mid to distal calf where it occludes.  Previous posterior tibial artery intervention is occluded and there is no stump proximally for antegrade intervention and only a distal vessel at the ankle that reconstituted.  Ultimately after evaluating the images, I thought the only option was retrograde approach.  The right foot was prepped and draped and then we accessed the right posterior tibial retrograde at the ankle and placed a slender 4/5 French sheath and treated the entire posterior tibial and TP trunk with 3 mm angioplasty balloon with retrograde appoach.  He now has inline flow down the right lower extremity.             Procedure:  The patient was identified in the holding area and taken to room 8.  The patient was then placed supine on the table and prepped and draped in the usual sterile fashion.  A time out was called.  Ultrasound was used to evaluate the left common femoral artery.  It was patent .  A digital ultrasound image was acquired.  A micropuncture needle was used to access the left common femoral artery under ultrasound guidance above his Viabhans stents.  An 018 wire was advanced without resistance and a micropuncture sheath was placed.  The 018 wire was removed and a benson wire was placed.  The micropuncture sheath was exchanged for a 5 french sheath.  An omniflush catheter was advanced over the wire to the level of L-1.  An abdominal angiogram was obtained.  Next, using the omniflush catheter and a benson wire, the aortic bifurcation was crossed and the catheter was placed into theright external iliac artery and right runoff was obtained.  After evaluating the images felt the patient would only have an option of a retrograde tibial intervention.  The right foot was then prepped and draped in usual sterile fashion.  I used ultrasound evaluated the right posterior tibial artery at the ankle it was patent and image was saved.  This was then accessed  under ultrasound guidance retrograde with a micro needle, placed a microwire, and then a micro sheath.  I then used a CXI catheter with a V 18 wire and was able to cross the posterior tibial artery long segment occlusion retrograde and get into the TP trunk and below-knee popliteal artery.  I confirmed on hand-injection through the CXI catheter that I was indeed in the true lumen.  Patient was given 100 units per kilogram heparin.  I then exchanged for a 4/5 French slender sheath in the right posterior tibial artery retrograde.  I then used a long 3 mm Sterling balloon and the entire TP trunk and posterior tibial artery down to the ankle was treated with 3 mm angioplasty balloon to nominal pressure with prolonged inflation time.  Final retrograde injection showed no evidence of dissection with inline flow down the posterior tibial as well as the TP trunk.  Satisfied with the results wires and catheters were removed.  A mynx closure device was deployed in the left common femoral artery.  Pressure was held and the sheath was removed from the right posterior tibial.  Taken to recovery in stable condition.  Plan: Continue Plavix.  Resume heparin drip in 8 hours without bolus.   Marty Heck, MD [facility] [place] Office: [phone]  Medications: . sodium chloride    . heparin 1,450 Units/hr (12/23/19 0339)   . allopurinol  300 mg Oral Daily  . Chlorhexidine Gluconate Cloth  6 each Topical Q0600  . cinacalcet  30 mg Oral Q supper  . clopidogrel  75 mg Oral Q breakfast  . hydroxychloroquine  200 mg Oral BID  . metoprolol tartrate  100 mg Oral BID  . midodrine  10 mg Oral Q M,W,F-HD  . pantoprazole  40 mg Oral Daily  . potassium chloride  20-40 mEq Oral Once  . rosuvastatin  40 mg Oral q1800  . sevelamer carbonate  3,200 mg Oral TID WC  . sodium chloride flush  3 mL Intravenous Q12H  . Warfarin - Pharmacist Dosing Inpatient   Does not apply q1600     Dialysis  Orders: [facility] on MWF (Followed by [facility]) Per pt: EDW 80kg, LUE AVF, sensipar 30mg  daily, renvela 4 tabs PO per meal Will request HD records  Assessment/Plan: 1.  PAD: S/p angiogram with intervention on 12/21/19, per vascular. Po 2.  ESRD: MWF HD today on schedule.  3.  Hypertension/volume: BP controlled, no volume overload on exam. Pt believes his EDW is 80kg which he is below today. Takes midodrine before dialysis. Chronic hypotension on midodrine. Attempting net UF 1.5 liters.  4.  Anemia: Hgb 11.2. No indication for ESA at this time.  5.  Metabolic bone disease: Calcium at goal. Will check phos with AM labs tomorrow. Continue sensipar and renvela.  6.  Mechanical heart valve: On warfarin. Waiting for INR to be therapeutic.  7. A.fib: Rate controlled, on metoprolol and warfarin as above  Davyn Morandi H. Gerod Caligiuri NP-C 12/23/2019, 8:44 AM  [employer] [phone]

## 2019-12-23 NOTE — Progress Notes (Addendum)
[place] for IV Heparin to warfarin Indication:  Mechanical heart valve, atrial fibrillation  Allergies  Allergen Reactions  . Statins Other (See Comments)    Other reaction(s): Arthralgias (intolerance), Myalgias (intolerance)  Pt taking pravastatin   . Sertraline Rash    Patient Measurements: Total Body Weight: 78.9 kg Height: 70 inches Heparin Dosing Weight: 78.9 kg  Labs: Recent Labs    12/21/19 1639 12/21/19 1639 12/22/19 0333 12/22/19 0650 12/23/19 0216 12/23/19 0858  HGB 12.0*   < >  --  11.7* 11.2*  --   HCT 40.2  --   --  38.5* 37.8*  --   PLT 82*  --   --  75* 74*  --   LABPROT 24.3*   < > 20.0* 18.8* 18.3*  --   INR 2.3*   < > 1.8* 1.6* 1.6*  --   HEPARINUNFRC  --   --  0.26*  --  0.28*  --   CREATININE 4.18*  --  5.28*  --   --  7.45*   < > = values in this interval not displayed.    Estimated Creatinine Clearance: 10.6 mL/min (A) (by C-G formula based on SCr of 7.45 mg/dL (H)).  Assessment: 64 yr old male on warfarin PTA for atrial fibrillation and mechanical aortic valve. Now s/p R leg arteriogram and intervention. Pharmacy consulted to manage heparin and restart warfarin s/p procedures. S/p FFP x 2.  PTA warfarin dose: 1 mg PO daily (per 9/20 anti-coag visit)  *Heparin level is undetectable due to heparin being turned off during hemodialysis. Floor RN notified pharmacy of this and restarted at 1308. Hemodialysis RN endorses turning heparin off around 1100, I asked him to please document this in the [facility].  INR remains subtherapeutic at 1.6. CBC stable. No bleeding noted  Goal of Therapy:  INR goal 2.5-3.5 Heparin level 0.3-0.7 units/ml Monitor platelets by anticoagulation protocol: Yes   Plan:  Give heparin bolus 3500 units Restart heparin infusion at 1450 units/hr Check anti-Xa level in 8 hours and daily while on heparin Continue to monitor H&H and platelets Give warfarin 2 mg x 1 dose, then consider  restarting home dose of 1 mg daily   Thank you for allowing [name] to participate in this patients care.   Jens Som, PharmD Please see amion for complete clinical pharmacist phone list. 12/23/2019 1:41 PM

## 2019-12-23 NOTE — Progress Notes (Signed)
Patient arrived back from dialysis unit to 4e08. IV pump which was infusing heparin was turned off. Pump turned back on and new Heparin bag was hung per orders. Vital signs obtained. Will monitor patient. Alic Hilburn, Bettina Gavia RN   [phone]. Spoke with dialysis RN, He stated that heparin had been turned off at approximately 11AM. Pharmacist on floor and made aware.  Nelda Bucks, Bettina Gavia RN

## 2019-12-23 NOTE — Progress Notes (Signed)
Mobility Specialist - Progress Note   12/23/19 1437  Mobility  Activity Ambulated in hall  Level of Assistance Modified independent, requires aide device or extra time  Assistive Device Front wheel walker  Distance Ambulated (ft) 300 ft  Mobility Response Tolerated well  Mobility performed by Mobility specialist  $Mobility charge 1 Mobility    Pre-mobility: 110 HR, 96% SpO2 During mobility: 112 HR, 94% SpO2 Post-mobility: 115 HR, 100% SpO2  Pt c/o R heel pain while walking that he rated a 5/10. He says his R foot is less tender than yesterday and that placing a pad under his heel has provided some relief.   Pricilla Handler Mobility Specialist Mobility Specialist Phone: [phone]'

## 2019-12-23 NOTE — Progress Notes (Addendum)
Progress Note    12/23/2019 7:10 AM 1 Day Post-Op  Subjective:  Sleeping and wakes easily  Afebrile HR 80's-100's afib 498'Y-641'R systolic 830% CPAP  Vitals:   12/22/19 2315 12/23/19 0608  BP: 115/75 114/73  Pulse: (!) 105 96  Resp: 15 15  Temp: 98 F (36.7 C) 98.2 F (36.8 C)  SpO2: 97% 98%    Physical Exam: Cardiac:  regular Lungs:  Non labored Incisions:  Left groin is soft without hematoma Extremities:  Brisk right PT and monophasic AT   CBC    Component Value Date/Time   WBC 5.2 12/23/2019 0216   RBC 3.52 (L) 12/23/2019 0216   HGB 11.2 (L) 12/23/2019 0216   HGB 10.0 (L) 08/30/2014 1013   HCT 37.8 (L) 12/23/2019 0216   HCT 32.2 (L) 08/30/2014 1013   PLT 74 (L) 12/23/2019 0216   PLT 125 (L) 08/30/2014 1013   MCV 107.4 (H) 12/23/2019 0216   MCV 88.2 09/16/2014 1041   MCV 89 08/30/2014 1013   MCH 31.8 12/23/2019 0216   MCHC 29.6 (L) 12/23/2019 0216   RDW 17.8 (H) 12/23/2019 0216   RDW 14.0 08/30/2014 1013   LYMPHSABS 1.2 10/03/2019 0348   LYMPHSABS 0.8 08/30/2014 1013   MONOABS 0.4 10/03/2019 0348   EOSABS 0.1 10/03/2019 0348   EOSABS 0.1 08/30/2014 1013   BASOSABS 0.0 10/03/2019 0348   BASOSABS 0.0 08/30/2014 1013    BMET    Component Value Date/Time   NA 137 12/22/2019 0333   NA 143 04/14/2019 0000   K 5.0 12/22/2019 0333   CL 95 (L) 12/22/2019 0333   CO2 26 12/22/2019 0333   GLUCOSE 82 12/22/2019 0333   BUN 22 12/22/2019 0333   BUN 20 04/14/2019 0000   CREATININE 5.28 (H) 12/22/2019 0333   CALCIUM 8.9 12/22/2019 0333   CALCIUM 9.2 12/14/2015 1059   GFRNONAA 11 (L) 12/22/2019 0333   GFRAA 12 (L) 12/22/2019 0333    INR    Component Value Date/Time   INR 1.6 (H) 12/23/2019 0216   INR 2.5 12/14/2019 0000   INR 2.5 12/14/2019 0000     Intake/Output Summary (Last 24 hours) at 12/23/2019 0710 Last data filed at 12/23/2019 0019 Gross per 24 hour  Intake 108.31 ml  Output --  Net 108.31 ml     Assessment:  64 y.o. male is s/p:   1.  Ultrasound-guided access of the left common femoral artery 2.  Aortogram including catheter selection of aorta 3.  Right lower extremity arteriogram with selection of second-order branches 4.  Ultrasound-guided access of the right posterior tibial artery at the ankle retrograde 5.  Right posterior tibial artery and tibioperoneal trunk balloon angioplasty from retrograde approach (3 mm x 220 mm Sterling) 6.  Mynx closure of the left common femoral artery 7.  52 minutes of monitored moderate conscious sedation time  1 Day Post-Op   Plan: -pt with brisk right PT doppler signal and monophasic AT -heparin to coumadin bridge for mechanical AV for target of 2.5-3.5. -thrombocytopenia-was 82k on admission labs.  Stable to today at 74k.  Given it was low on admission, doubt HIT.  Check labs in the am.   -increase mobilization today -ESRD-nephrology consulted for HD   Leontine Locket, PA-C [facility] [phone] 12/23/2019 7:10 AM  I have seen and evaluated the patient. I agree with the PA note as documented above.  Postop day 1 status post retrograde right PT intervention for critical limb ischemia with tissue  loss.  Has a good posterior tibial signal today.  Site is clean dry and intact.  His left groin access site is also clean and dry.  He is getting dialysis this morning for ESRD.  INR is 1.6 today and discussed likely 1 more day in the hospital until his INR is therapeutic with coumadin given mechanical aortic valve and preadmission for heparin bridge.  Marty Heck, MD [facility] [place] Office: [phone]

## 2019-12-23 NOTE — Telephone Encounter (Signed)
Spoke with Olivia Mackie at [facility] and advised that ov note 12/06/19 was faxed. Nothing further needed.

## 2019-12-24 LAB — CBC
HCT: 36.2 % — ABNORMAL LOW (ref 39.0–52.0)
Hemoglobin: 10.7 g/dL — ABNORMAL LOW (ref 13.0–17.0)
MCH: 31.8 pg (ref 26.0–34.0)
MCHC: 29.6 g/dL — ABNORMAL LOW (ref 30.0–36.0)
MCV: 107.7 fL — ABNORMAL HIGH (ref 80.0–100.0)
Platelets: 69 10*3/uL — ABNORMAL LOW (ref 150–400)
RBC: 3.36 MIL/uL — ABNORMAL LOW (ref 4.22–5.81)
RDW: 17.6 % — ABNORMAL HIGH (ref 11.5–15.5)
WBC: 5 10*3/uL (ref 4.0–10.5)
nRBC: 0 % (ref 0.0–0.2)

## 2019-12-24 LAB — PROTIME-INR
INR: 2.1 — ABNORMAL HIGH (ref 0.8–1.2)
Prothrombin Time: 22.5 seconds — ABNORMAL HIGH (ref 11.4–15.2)

## 2019-12-24 LAB — HEPARIN LEVEL (UNFRACTIONATED): Heparin Unfractionated: 0.38 IU/mL (ref 0.30–0.70)

## 2019-12-24 MED ORDER — WARFARIN SODIUM 1 MG PO TABS: 1.0000 mg | ORAL_TABLET | Freq: Once | ORAL | Status: DC

## 2019-12-24 NOTE — Progress Notes (Addendum)
Progress Note    12/24/2019 7:54 AM 2 Days Post-Op  Subjective:  Says he has some soreness on the top of his foot.  He states he walked in the halls yesterday.  Tm 99.8 now afebrile HR 60's-110's ST 962'X-528'U systolic 13% RA  Vitals:   12/24/19 0120 12/24/19 0354  BP: 120/76 118/78  Pulse: (!) 102 (!) 109  Resp: 15 16  Temp: 98.3 F (36.8 C) 98.5 F (36.9 C)  SpO2:  96%    Physical Exam: Cardiac:  regular Lungs:  Non labored Incisions:  Left groin is soft without hematoma.  Extremities:  Brisk right PT doppler signal; area of discoloration that appears to be a bruise on the dorsum on the left foot distal to great toe   CBC    Component Value Date/Time   WBC 5.0 12/24/2019 0234   RBC 3.36 (L) 12/24/2019 0234   HGB 10.7 (L) 12/24/2019 0234   HGB 10.0 (L) 08/30/2014 1013   HCT 36.2 (L) 12/24/2019 0234   HCT 32.2 (L) 08/30/2014 1013   PLT 69 (L) 12/24/2019 0234   PLT 125 (L) 08/30/2014 1013   MCV 107.7 (H) 12/24/2019 0234   MCV 88.2 09/16/2014 1041   MCV 89 08/30/2014 1013   MCH 31.8 12/24/2019 0234   MCHC 29.6 (L) 12/24/2019 0234   RDW 17.6 (H) 12/24/2019 0234   RDW 14.0 08/30/2014 1013   LYMPHSABS 1.2 10/03/2019 0348   LYMPHSABS 0.8 08/30/2014 1013   MONOABS 0.4 10/03/2019 0348   EOSABS 0.1 10/03/2019 0348   EOSABS 0.1 08/30/2014 1013   BASOSABS 0.0 10/03/2019 0348   BASOSABS 0.0 08/30/2014 1013    BMET    Component Value Date/Time   NA 136 12/23/2019 0858   NA 143 04/14/2019 0000   K 5.2 (H) 12/23/2019 0858   CL 96 (L) 12/23/2019 0858   CO2 27 12/23/2019 0858   GLUCOSE 79 12/23/2019 0858   BUN 37 (H) 12/23/2019 0858   BUN 20 04/14/2019 0000   CREATININE 7.45 (H) 12/23/2019 0858   CALCIUM 8.8 (L) 12/23/2019 0858   CALCIUM 9.2 12/14/2015 1059   GFRNONAA 7 (L) 12/23/2019 0858   GFRAA 8 (L) 12/23/2019 0858    INR    Component Value Date/Time   INR 2.1 (H) 12/24/2019 0234   INR 2.5 12/14/2019 0000   INR 2.5 12/14/2019 0000      Intake/Output Summary (Last 24 hours) at 12/24/2019 0754 Last data filed at 12/23/2019 2349 Gross per 24 hour  Intake 321.91 ml  Output 1500 ml  Net -1178.09 ml     Assessment:  64 y.o. male is s/p:  1.Ultrasound-guided access of the left common femoral artery 2.Aortogram including catheter selection of aorta 3.Right lower extremity arteriogram with selection of second-order branches 4.Ultrasound-guided access of the right posterior tibial artery at the ankle retrograde 5.Right posterior tibial artery and tibioperoneal trunk balloon angioplastyfrom retrograde approach(3 mm x 220 mm Sterling) 6.Mynx closure of the left common femoral artery 7.52 minutes of monitored moderate conscious sedationtime   2 Days Post-Op  Plan: -pt continues to have brisk right PT doppler signal.  He states he has some soreness on the dorusm on the foot.  There is an area that appears to be a bruise distal to the great toe.  Discussed with pt to keep an eye on this area and if it worsens or doesn't improve, he needs to contact us.   -INR today is 2.1 up from 1.6.  Discussed with pt and  he has appt to have INR checked on Monday.  He plans to take coumadin 2mg  tonight and 1mg  tomorrow night.  Anticipate his INR will continue to increase given he has been given double his normal dose and it increased from 1.6 to 2.1 after 2 doses.   -DVT prophylaxis:  Heparin to coumadin bridge for mechanical AVR -will d/w Dr. Donzetta Matters about discharging pt home today and f/u with Dr. Carlis Abbott in 4 weeks with ABI and arterial duplex. Sooner if the pt has any issues before then.    Leontine Locket, PA-C [facility] [phone] 12/24/2019 7:54 AM  I have independently interviewed and examined the patient with Leontine Locket, PA.  Right foot is very warm.  Okay for discharge today and he will have an INR check on Monday.  Nelda Luckey C. Donzetta Matters, MD [facility] [place] Office:  [phone] Pager: [phone]

## 2019-12-24 NOTE — Plan of Care (Signed)
  Problem: Health Behavior/Discharge Planning: Goal: Ability to manage health-related needs will improve Outcome: Progressing   Problem: Clinical Measurements: Goal: Will remain free from infection Outcome: Progressing   

## 2019-12-24 NOTE — Progress Notes (Signed)
Discharge instructions provided to patient. All medications, follow up appointments, and discharge instructions provided. IV out. Monitor off CCMD notified. Discharging to home.  °Evon Lopezperez R Merlin Golden, RN  °

## 2019-12-24 NOTE — Discharge Instructions (Signed)
° °  [facility] [place] ° °Discharge Instructions ° °Lower Extremity Angiogram; Angioplasty/Stenting ° °Please refer to the following instructions for your post-procedure care. Your surgeon or physician assistant will discuss any changes with you. ° °Activity ° °Avoid lifting more than 8 pounds (1 gallons of milk) for 72 hours (3 days) after your procedure. You may walk as much as you can tolerate. It's OK to drive after 72 hours. ° °Bathing/Showering ° °You may shower the day after your procedure. If you have a bandage, you may remove it at 24- 48 hours. Clean your incision site with mild soap and water. Pat the area dry with a clean towel. ° °Diet ° °Resume your pre-procedure diet. There are no special food restrictions following this procedure. All patients with peripheral vascular disease should follow a low fat/low cholesterol diet. In order to heal from your surgery, it is CRITICAL to get adequate nutrition. Your body requires vitamins, minerals, and protein. Vegetables are the best source of vitamins and minerals. Vegetables also provide the perfect balance of protein. Processed food has little nutritional value, so try to avoid this. ° °Medications ° °Resume taking all of your medications unless your doctor tells you not to. If your incision is causing pain, you may take over-the-counter pain relievers such as acetaminophen (Tylenol) ° °Follow Up ° °Follow up will be arranged at the time of your procedure. You may have an office visit scheduled or may be scheduled for surgery. Ask your surgeon if you have any questions. ° °Please call us immediately for any of the following conditions: °•Severe or worsening pain your legs or feet at rest or with walking. °•Increased pain, redness, drainage at your groin puncture site. °•Fever of 101 degrees or higher. °•If you have any mild or slow bleeding from your puncture site: lie down, apply firm constant pressure over the area with a piece of  gauze or a clean wash cloth for 30 minutes- no peeking!, call 911 right away if you are still bleeding after 30 minutes, or if the bleeding is heavy and unmanageable. ° °Reduce your risk factors of vascular disease: ° °Stop smoking. If you would like help call [facility] at 1-800-QUIT-NOW (1-800-784-8669) or [facility] at 336-586-4000. °Manage your cholesterol °Maintain a desired weight °Control your diabetes °Keep your blood pressure down ° °If you have any questions, please call the office at [phone] ° °

## 2019-12-24 NOTE — Discharge Summary (Signed)
Discharge Summary    Jeremy [name] 10/03/1955 63 y.o. male  [id number]  Admission Date: 12/21/2019  Discharge Date: 12/24/2019  Physician: Marty Heck, MD  Admission Diagnosis: PAD (peripheral artery disease) ([place]) [I73.9]   HPI:   This is a 64 y.o. male with multiple medical problems including atrial fibrillation on Coumadin, peripheral arterial disease, coronary artery disease status post CABG and subsequent PCI, aortic stenosis with mechanical aortic valve, ESRD, hypertension, hyperlipidemia thatpresents for 1 month follow-up of right foot ulcer. He was previously seen by Dr. Donnetta Hutching for a second opinion and then since Dr. Donnetta Hutching moved to [place] I was asked to follow his care here in [place]. He feels his right foot has gotten worse over the last month. Now describes gangrene at the toe tip.  He has a complex recent historyandhad an area of nonhealing ulceration in his right foot and on 08/25/2019 underwent arteriography via theleftfemoral approach at [facility]. He had angioplasty of the tibioperoneal trunk, peroneal and posterior tibial arteries on the right with drug-eluting balloon. 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 Apparently had complication of bleeding follow thisandwas taken to the [facility] lab and underwent Viabahn stenting of his deep femoral artery, superficial femoral artery and common femoral artery for control of bleeding.  Hospital Course:  The patient was admitted to the hospital for coumadin reversal and received FFP.  He was then taken to the [place] lab on 12/22/2019 and underwent: 1.  Ultrasound-guided access of the left common femoral artery 2.  Aortogram including catheter selection of aorta 3.  Right lower extremity arteriogram with selection of second-order branches 4.  Ultrasound-guided access of the right posterior tibial artery at the ankle retrograde 5.  Right posterior tibial  artery and tibioperoneal trunk balloon angioplasty from retrograde approach (3 mm x 220 mm [place]) 6.  Mynx closure of the left common femoral artery 7.  52 minutes of monitored moderate conscious sedation time    Findings:   Aortogram showed no flow-limiting stenosis in the aortoiliac segment although severely calcified.  Right lower extremity arteriogram showed a patent although calcified and diseased common femoral, profunda, SFA, above and below-knee popliteal artery without flow-limiting stenosis.  Anterior tibial artery occluded shortly after takeoff.  He had a patent tibioperoneal trunk with a single vessel runoff via a patent peroneal down to the mid to distal calf where it occludes.  Previous posterior tibial artery intervention is occluded and there is no stump proximally for antegrade intervention and only a distal vessel at the ankle that reconstituted.  Ultimately after evaluating the images, I thought the only option was retrograde approach.  The right foot was prepped and draped and then we accessed the right posterior tibial retrograde at the ankle and placed a slender 4/5 French sheath and treated the entire posterior tibial and TP trunk with 3 mm angioplasty balloon with retrograde appoach.  He now has inline flow down the right lower extremity.  The pt tolerated the procedure well and was transported to the PACU in good condition.  Nephrology was called for HD.   POD 1, pt was doing well with brisk right PT doppler signal.  His INR was 1.6.  POD 2, pt continued to have brisk right PT doppler signal.  He did have an area of bruising on the dorsum of the right foot distal to great toe.  Discussed with pt to keep an eye on this area and if it worsens or doesn't  improve, he needs to contact us.    His INR has increased to 2.1 from 1.6.  Discussed with pt and he has appt to have INR checked on Monday.  He plans to take coumadin 2mg  tonight and 1mg  tomorrow night.  Anticipate his INR  will continue to increase given he has been given double his normal dose and it increased from 1.6 to 2.1 after 2 doses.  Dr. Donzetta Matters okay with pt discharging home and f/u on Monday to have INR checked.   The remainder of the hospital course consisted of increasing mobilization and increasing intake of solids without difficulty.  CBC    Component Value Date/Time   WBC 5.0 12/24/2019 0234   RBC 3.36 (L) 12/24/2019 0234   HGB 10.7 (L) 12/24/2019 0234   HGB 10.0 (L) 08/30/2014 1013   HCT 36.2 (L) 12/24/2019 0234   HCT 32.2 (L) 08/30/2014 1013   PLT 69 (L) 12/24/2019 0234   PLT 125 (L) 08/30/2014 1013   MCV 107.7 (H) 12/24/2019 0234   MCV 88.2 09/16/2014 1041   MCV 89 08/30/2014 1013   MCH 31.8 12/24/2019 0234   MCHC 29.6 (L) 12/24/2019 0234   RDW 17.6 (H) 12/24/2019 0234   RDW 14.0 08/30/2014 1013   LYMPHSABS 1.2 10/03/2019 0348   LYMPHSABS 0.8 08/30/2014 1013   MONOABS 0.4 10/03/2019 0348   EOSABS 0.1 10/03/2019 0348   EOSABS 0.1 08/30/2014 1013   BASOSABS 0.0 10/03/2019 0348   BASOSABS 0.0 08/30/2014 1013    BMET    Component Value Date/Time   NA 136 12/23/2019 0858   NA 143 04/14/2019 0000   K 5.2 (H) 12/23/2019 0858   CL 96 (L) 12/23/2019 0858   CO2 27 12/23/2019 0858   GLUCOSE 79 12/23/2019 0858   BUN 37 (H) 12/23/2019 0858   BUN 20 04/14/2019 0000   CREATININE 7.45 (H) 12/23/2019 0858   CALCIUM 8.8 (L) 12/23/2019 0858   CALCIUM 9.2 12/14/2015 1059   GFRNONAA 7 (L) 12/23/2019 0858   GFRAA 8 (L) 12/23/2019 0858      Discharge Instructions    Discharge patient   Complete by: As directed    Discharge disposition: 01-Home or Self Care   Discharge patient date: 12/24/2019      Discharge Diagnosis:  PAD (peripheral artery disease) ([place]) [I73.9]  Secondary Diagnosis: Patient Active Problem List   Diagnosis Date Noted  . PAD (peripheral artery disease) ([place]) 12/21/2019  . ILD (interstitial lung disease) ([place]) 12/06/2019  . Cough 12/06/2019  . Fatigue  10/19/2019  . Difficulty sleeping 10/19/2019  . Contusion of unspecified thigh, initial encounter 10/11/2019  . Other acute postprocedural pain 10/11/2019  . Demand ischemia ([place])   . Atrial flutter ([place])   . Non-ST elevation (NSTEMI) myocardial infarction ([place])   . Elevated troponin 10/01/2019  . Thigh hematoma 10/01/2019  . Postprocedural hypotension   . Hematoma of thigh, left, initial encounter 09/29/2019  . Unstable angina ([place]) 07/13/2019  . Atherosclerosis of native arteries of the extremities with ulceration ([place]) 07/05/2019  . Foot ulcer ([place]) 05/27/2019  . Skin lesion 11/17/2018  . Complication of vascular access for dialysis 10/01/2018  . S/P CABG x 3 09/16/2018  . Hydrocele 08/16/2018  . IBS (irritable bowel syndrome) 03/19/2018  . Depression 05/17/2017  . AV fistula ([place]) 05/17/2017  . Chronic pain of both knees 05/17/2017  . Arthralgia 05/17/2017  . Osteoarthritis 03/17/2017  . Trigger little finger of right hand 03/17/2017  . Liver disease, unspecified 03/17/2017  .  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 Atrial fibrillation ([place]) 10/30/2016  . Ventral hernia 10/14/2016  . History of renal cell carcinoma 08/04/2016  . Renal transplant failure and rejection 07/31/2016  . Fever 07/17/2016  . Hypogonadism in male 07/25/2015  . 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 Gout 04/24/2015  . Proteinuria 01/03/2015  . Chronic anticoagulation 11/06/2014  . Iron deficiency anemia 11/02/2014  . Epigastric hernia 02/08/2014  . 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[place]) 08/16/2013  . Vitamin D deficiency 08/16/2013  . Spinal stenosis 06/20/2013  . OSA (obstructive sleep apnea)   . Tremor 12/30/2012  . History of kidney transplant 12/15/2012  . Immunosuppressive management encounter following kidney transplant 12/15/2012  . S/P angioplasty with stent 09/07/2012  . H/O mitral valve replacement with mechanical valve 09/07/2012  . Lumbar radiculopathy  09/06/2012  . ESRD (end stage renal disease) ([place]) 07/26/2012  . Hyperlipidemia 07/26/2012  . S/P AVR (aortic valve replacement) 06/23/2012  . Aortic valve disorder 06/14/2012  . Heart failure ([place]) 05/30/2012  . Thyroid nodule 06/26/2011  . CAD (coronary artery disease) 05/26/2011  . Essential hypertension 05/19/2011   Past Medical History:  Diagnosis Date  . Abnormal stress test 03/19/2018  . 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 Aortic stenosis    a. 2014 s/p mech AVR ([facility])-->chronic coumadin; b. 08/2018 Echo: Mild AS/AI. Nl fxn/gradient.  . Arthritis   . Atrial fibrillation ([place])   . CAD (coronary artery disease)    a. 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 02/2018 MV [facility]): large inf, infsept, inflat infarct w/ min rev, EF 30%; c. 06/2018 Cath: LM min irregs, LAD 60p, 95/30/35m, LCX min irregs, RCA 40p, 29m, 90d, RPAV 90, RPDA 95ost; d. 09/2018 CABGx3 (LIMA->LAD, VG->D1, VG->PDA).  . Chronic anticoagulation 06/2012   a. Coumadin in the setting of mech AVR and afib.  Marland Kitchen COVID-19 03/15/2019  . CPAP (continuous positive airway pressure) dependence   . Dialysis patient [facility]) 04/07/2006   Patient started HD around 1996,  had 1st transplant LLQ around 1998 until 2007 when they found renal cell cancer in transplant and both native kidneys, all were removed > went back on HD until 2nd transplant RLQ in 2014 which lasted 3 yrs; it was embolized for suspected acute rejection. Is currently on HD MWF in [place] w/ [facility] nephrology.  . ESRD (end stage renal disease) ([place])    a. 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 1999 s/p Kidney transplant; c. 2014 Bilat nephrectomy (transplanted kidney resected) in the setting of renal cell carcinoma; d. PD until 11/2017-->HD.  Marland Kitchen Heart murmur   . HFrEF (heart failure with reduced ejection fraction) ([place])    a. 07/2016 Echo: EF 55-60%; b. 02/2018 MV: EF 30%; c. 04/2018 Echo: EF 35%; d.  08/2018 Echo: EF 30-35%. Glob HK w/o rwma. Mildly reduced RV fxn. Mod to sev dil LA. Nl AoV fxn.  Marland Kitchen Hx of colonoscopy    2009 by Dr. Laural Golden. Normal except for small submucosal lipoma. No evidence of polyps or colitis.  . Hyperlipidemia   . Hypertension   . Hypogonadism in male 07/25/2015  . Ischemic cardiomyopathy    a. 07/2016 Echo: EF 55-60%; b. 02/2018 MV: EF 30%; c. 04/2018 Echo: EF 35%; d. 08/2018 Echo: EF 30-35%.  . OSA (obstructive sleep apnea)    No CPAP  . Permanent atrial fibrillation ([facility])    a. CHA2DS2VASc =  3-->Coumadin (also mech AVR); b. 06/2018 Amio started 2/2 elevated rates.  . Renal cell cancer ([place])   . Renal cell carcinoma ([place]) 08/04/2016     Allergies as of 12/24/2019      Reactions   Statins Other (See Comments)   Other reaction(s): Arthralgias (intolerance), Myalgias (intolerance) Pt taking pravastatin    Sertraline Rash      Medication List    TAKE these medications   acetaminophen 500 MG tablet Commonly known as: TYLENOL Take 1,000 mg 2 (two) times daily as needed by mouth for moderate pain.   allopurinol 300 MG tablet Commonly known as: ZYLOPRIM Take 300 mg by mouth daily.   azelastine 0.1 % nasal spray Commonly known as: ASTELIN Place 1 spray into both nostrils 2 (two) times daily. Use in each nostril as directed   benzonatate 200 MG capsule Commonly known as: TESSALON Take 1 capsule (200 mg total) by mouth 3 (three) times daily as needed for cough.   cholecalciferol 25 MCG (1000 UNIT) tablet Commonly known as: VITAMIN D3 Take 1,000 Units by mouth daily.   cinacalcet 30 MG tablet Commonly known as: SENSIPAR Take 30 mg by mouth daily.   clopidogrel 75 MG tablet Commonly known as: PLAVIX Take 1 tablet (75 mg total) by mouth daily with breakfast.   ezetimibe 10 MG tablet Commonly known as: ZETIA Take 1 tablet (10 mg total) by mouth daily.   hydroxychloroquine 200 MG tablet Commonly known as: PLAQUENIL Take 200 mg by mouth 2 (two) times  daily.   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 Monday, Wednesday, Friday for dialysis   loratadine 10 MG tablet Commonly known as: CLARITIN Take 10 mg by mouth daily.   metoprolol tartrate 100 MG tablet Commonly known as: LOPRESSOR Take 1 tablet (100 mg total) by mouth 2 (two) times daily. Patient to call when refill needed.   midodrine 10 MG tablet Commonly known as: PROAMATINE Take 10 mg by mouth 3 (three) times a week. Mon, Wed, Friday   multivitamin Tabs tablet Take 1 tablet by mouth daily.   nitroGLYCERIN 0.4 MG SL tablet Commonly known as: NITROSTAT Place 1 tablet (0.4 mg total) under the tongue every 5 (five) minutes as needed for chest pain.   pantoprazole 40 MG tablet Commonly known as: PROTONIX Take 1 tablet (40 mg total) by mouth daily.   predniSONE 1 MG tablet Commonly known as: DELTASONE Take 4 mg by mouth daily with breakfast.   RETACRIT IJ Epoetin alfa - epbx (Retacrit)   rosuvastatin 40 MG tablet Commonly known as: CRESTOR Take 1 tablet (40 mg total) by mouth daily at 6 PM.   sevelamer carbonate 800 MG tablet Commonly known as: RENVELA Take 3,200 mg by mouth 3 (three) times daily with meals. 4 tabs per meal   silver sulfADIAZINE 1 % cream Commonly known as: SILVADENE Apply 1 application topically daily. Apply to right great toe and heel   traZODone 50 MG tablet Commonly known as: DESYREL TAKE 1/2 -1 TABLET (25-50 MG TOTAL) BY MOUTH AT BEDTIME AS NEEDED FOR SLEEP. What changed: See the new instructions.   warfarin 1 MG tablet Commonly known as: COUMADIN Take as directed. If you are unsure how to take this medication, talk to your nurse or doctor. Original instructions: Take 1 mg by mouth daily.   zinc sulfate 220 (50 Zn) MG capsule Take 220 mg by mouth daily.         Instructions:  [facility]  Discharge Instructions  Lower  Extremity Angiogram; Angioplasty/Stenting  Please refer to the following instructions for your post-procedure care. Your surgeon or physician assistant will discuss any changes with you.  Activity  Avoid lifting more than 8 pounds (1 gallons of milk) for 72 hours (3 days) after your procedure. You may walk as much as you can tolerate. It's OK to drive after 72 hours.  Bathing/Showering  You may shower the day after your procedure. If you have a bandage, you may remove it at 24- 48 hours. Clean your incision site with mild soap and water. Pat the area dry with a clean towel.  Diet  Resume your pre-procedure diet. There are no special food restrictions following this procedure. All patients with peripheral vascular disease should follow a low fat/low cholesterol diet. In order to heal from your surgery, it is CRITICAL to get adequate nutrition. Your body requires vitamins, minerals, and protein. Vegetables are the best source of vitamins and minerals. Vegetables also provide the perfect balance of protein. Processed food has little nutritional value, so try to avoid this.  Medications  Resume taking all of your medications unless your doctor tells you not to. If your incision is causing pain, you may take over-the-counter pain relievers such as acetaminophen (Tylenol)  Follow Up  Follow up will be arranged at the time of your procedure. You may have an office visit scheduled or may be scheduled for surgery. Ask your surgeon if you have any questions.  Please call us immediately for any of the following conditions: .Severe or worsening pain your legs or feet at rest or with walking. .Increased pain, redness, drainage at your groin puncture site. .Fever of 101 degrees or higher. .If you have any mild or slow bleeding from your puncture site: lie down, apply firm constant pressure over the area with a piece of gauze or a clean wash cloth for 30 minutes- no peeking!, call 911 right away if you  are still bleeding after 30 minutes, or if the bleeding is heavy and unmanageable.  Reduce your risk factors of vascular disease:  . Stop smoking. If you would like help call [facility] at 1-800-QUIT-NOW 702-427-5087) or [place] at (702)444-2602. . Manage your cholesterol . Maintain a desired weight . Control your diabetes . Keep your blood pressure down .  If you have any questions, please call the office at [phone]  Prescriptions given: none given  Disposition: home  Patient's condition: is Good  Follow up: 1. Dr. Carlis Abbott in 4-6 weeks with ABI and RLE arterial duplex.    Leontine Locket, PA-C [facility] [phone] 12/24/2019  8:45 AM

## 2019-12-24 NOTE — Progress Notes (Addendum)
[facility] Progress Note   Subjective: Seen in room, dressed to go home. No C/Os. Tolerated HD without issue yesterday.     Objective Vitals:   12/24/19 0031 12/24/19 0120 12/24/19 0354 12/24/19 0824  BP: 120/76 120/76 118/78 131/84  Pulse: (!) 103 (!) 102 (!) 109 (!) 108  Resp: 12 15 16    Temp: 98.3 F (36.8 C) 98.3 F (36.8 C) 98.5 F (36.9 C) 98.1 F (36.7 C)  TempSrc:   Oral Oral  SpO2:   96% 96%  Weight:   77.5 kg    Physical Exam General: Pleasant older male in NAD Heart: irreg, irreg with mechanical click. AFIB on monitor. Rate in 100s.  Lungs: CTAB A/P Abdomen: S, NT, active BS Extremities:No LE edema Dialysis Access: L AVF + bruit   Additional Objective Labs: Basic Metabolic Panel: Recent Labs  Lab 12/21/19 1639 12/22/19 0333 12/23/19 0858  NA 138 137 136  K 3.9 5.0 5.2*  CL 94* 95* 96*  CO2 33* 26 27  GLUCOSE 110* 82 79  BUN 13 22 37*  CREATININE 4.18* 5.28* 7.45*  CALCIUM 8.7* 8.9 8.8*  PHOS  --   --  5.0*   Liver Function Tests: Recent Labs  Lab 12/21/19 1639 12/23/19 0858  AST 29  --   ALT 25  --   ALKPHOS 148*  --   BILITOT 0.9  --   PROT 6.9  --   ALBUMIN 3.4* 3.2*   No results for input(s): LIPASE, AMYLASE in the last 168 hours. CBC: Recent Labs  Lab 12/21/19 1639 12/21/19 1639 12/22/19 0650 12/23/19 0216 12/24/19 0234  WBC 5.0   < > 5.1 5.2 5.0  HGB 12.0*   < > 11.7* 11.2* 10.7*  HCT 40.2   < > 38.5* 37.8* 36.2*  MCV 106.9*  --  108.1* 107.4* 107.7*  PLT 82*   < > 75* 74* 69*   < > = values in this interval not displayed.   Blood Culture    Component Value Date/Time   SDES BLOOD BLOOD RIGHT HAND 10/03/2019 1851   SPECREQUEST  10/03/2019 1851    BOTTLES DRAWN AEROBIC AND ANAEROBIC Blood Culture results may not be optimal due to an excessive volume of blood received in culture bottles   CULT  10/03/2019 1851    NO GROWTH 5 DAYS Performed at [facility], [address] [place], [place]  [id number]    REPTSTATUS 10/08/2019 FINAL 10/03/2019 1851    Cardiac Enzymes: No results for input(s): CKTOTAL, CKMB, CKMBINDEX, TROPONINI in the last 168 hours. CBG: No results for input(s): GLUCAP in the last 168 hours. Iron Studies: No results for input(s): IRON, TIBC, TRANSFERRIN, FERRITIN in the last 72 hours. @lablastinr3 @ Studies/Results: No results found. Medications: . sodium chloride    . heparin 1,450 Units/hr (12/24/19 0143)   . allopurinol  300 mg Oral Daily  . Chlorhexidine Gluconate Cloth  6 each Topical Q0600  . cinacalcet  30 mg Oral Q supper  . clopidogrel  75 mg Oral Q breakfast  . hydroxychloroquine  200 mg Oral BID  . metoprolol tartrate  100 mg Oral BID  . midodrine  10 mg Oral Q M,W,F-HD  . pantoprazole  40 mg Oral Daily  . potassium chloride  20-40 mEq Oral Once  . rosuvastatin  40 mg Oral q1800  . sevelamer carbonate  3,200 mg Oral TID WC  . sodium chloride flush  3 mL Intravenous Q12H  . warfarin  1 mg  Oral ONCE-1600  . Warfarin - Pharmacist Dosing Inpatient   Does not apply q1600     Dialysis Orders: [facility] on MWF (Followed by [facility]) 3:45 hr 180NRe 450/800 79.5 kg 2.0K/2.25 Ca  AVF -No heparin -Venofer 100 mg IV X 5 -Calcitriol 1.5 mcg PO TIW  Assessment/Plan: 1. PAD: S/p angiogram with intervention on 12/21/19, per vascular. DC home today.  2. ESRD:MWF Next HD at OP center on schedule 12/26/2019.  3. Hypertension/volume:BP controlled, no volume overload on exam. Takes midodrine before dialysis. Chronic hypotension on midodrine. Now under OP EDW-wt down to 77.5 kg. Will fax DC info to OP center.  4. Anemia:Hgb 10.7. No indication for ESA at this time. 5. Metabolic bone disease:All labs at goal. Continue sensipar and renvela.  6. Mechanical heart valve: On warfarin. INR 2.1 today. Needs F/U INR as OP per DC orders. 7. A.fib:Rate controlled, on metoprolol and warfarin as above  Adith Tejada H. Kourtney Terriquez NP-C 12/24/2019, 10:21 AM   [employer] [phone]

## 2019-12-24 NOTE — Progress Notes (Signed)
ANTICOAGULATION CONSULT NOTE   Pharmacy Consult for IV Heparin to warfarin Indication:  Mechanical heart valve, atrial fibrillation  Allergies  Allergen Reactions   Statins Other (See Comments)    Other reaction(s): Arthralgias (intolerance), Myalgias (intolerance)  Pt taking pravastatin    Sertraline Rash    Patient Measurements: Total Body Weight: 78.9 kg Height: 70 inches Heparin Dosing Weight: 78.9 kg  Labs: Recent Labs    12/21/19 1639 12/21/19 1639 12/22/19 0333 12/22/19 0333 12/22/19 0650 12/23/19 0216 12/23/19 0216 12/23/19 0858 12/23/19 1302 12/23/19 2131 12/24/19 0234  HGB 12.0*   < >  --    < > 11.7* 11.2*  --   --   --   --  10.7*  HCT 40.2   < >  --   --  38.5* 37.8*  --   --   --   --  36.2*  PLT 82*   < >  --   --  75* 74*  --   --   --   --  69*  LABPROT 24.3*   < > 20.0*   < > 18.8* 18.3*  --   --   --   --  22.5*  INR 2.3*   < > 1.8*   < > 1.6* 1.6*  --   --   --   --  2.1*  HEPARINUNFRC  --   --  0.26*   < >  --  0.28*   < >  --  <0.10* 0.43 0.38  CREATININE 4.18*  --  5.28*  --   --   --   --  7.45*  --   --   --    < > = values in this interval not displayed.    Estimated Creatinine Clearance: 10.6 mL/min (A) (by C-G formula based on SCr of 7.45 mg/dL (H)).  Assessment: 64 yr old male on warfarin PTA for atrial fibrillation and mechanical aortic valve. Now s/p R leg arteriogram and intervention. Pharmacy consulted to manage heparin and restart warfarin s/p procedures. S/p FFP x 2.  PTA warfarin dose: 1 mg PO daily (per 9/20 anti-coag visit)  Heparin drip 1450 uts/hr HL 0.38 at goal  INR subtherapeutic at 2.1. CBC stable. No bleeding noted   Goal of Therapy:  INR goal 2.5-3.5 Heparin level 0.3-0.7 units/ml Monitor platelets by anticoagulation protocol: Yes   Plan:  Continue  heparin infusion at 1450 units/hr Warfarin 1mg  x1 Daily heparin level Continue to monitor H&H and platelet   Norina Buzzard, PharmD PGY1 Pharmacy  Resident 12/24/2019 8:12 AM   Please see amion for complete clinical pharmacist phone list.

## 2019-12-26 DIAGNOSIS — I739 Peripheral vascular disease, unspecified: Secondary | ICD-10-CM | POA: Insufficient documentation

## 2019-12-27 ENCOUNTER — Encounter (HOSPITAL_COMMUNITY): Payer: Medicare Other

## 2019-12-27 ENCOUNTER — Ambulatory Visit: Payer: Medicare Other | Admitting: Vascular Surgery

## 2019-12-27 LAB — LAB REPORT - SCANNED: INR: 2.7

## 2019-12-28 ENCOUNTER — Other Ambulatory Visit: Payer: Self-pay | Admitting: Primary Care

## 2019-12-28 ENCOUNTER — Ambulatory Visit (INDEPENDENT_AMBULATORY_CARE_PROVIDER_SITE_OTHER): Payer: Medicare Other

## 2019-12-28 ENCOUNTER — Encounter: Payer: Self-pay | Admitting: Cardiovascular Disease

## 2019-12-28 DIAGNOSIS — Z5181 Encounter for therapeutic drug level monitoring: Secondary | ICD-10-CM | POA: Diagnosis not present

## 2019-12-28 DIAGNOSIS — Z952 Presence of prosthetic heart valve: Secondary | ICD-10-CM

## 2019-12-28 DIAGNOSIS — I4811 Longstanding persistent atrial fibrillation: Secondary | ICD-10-CM | POA: Diagnosis not present

## 2019-12-28 LAB — POCT INR: INR: 2.7 (ref 2.0–3.0)

## 2019-12-28 NOTE — Patient Instructions (Signed)
Sent MyChart message to pt and advised him to : - continue to take 1 tablet of warfarin daily.  - Recheck INR in 1 week on Monday

## 2019-12-28 NOTE — Telephone Encounter (Signed)
This encounter was created in error - please disregard.

## 2020-01-01 DIAGNOSIS — G4733 Obstructive sleep apnea (adult) (pediatric): Secondary | ICD-10-CM

## 2020-01-02 NOTE — Telephone Encounter (Signed)
Order was not placed for new cpap machine during last OV.  Beth, what would you like settings to be?

## 2020-01-02 NOTE — Telephone Encounter (Signed)
Yes, we were unable to access his SD card. Looks like Dr. Mortimer Fries took over management of this- setting are not listed in chart. Can we find this out somehow

## 2020-01-03 ENCOUNTER — Other Ambulatory Visit: Payer: Self-pay

## 2020-01-03 ENCOUNTER — Ambulatory Visit
Admission: RE | Admit: 2020-01-03 | Discharge: 2020-01-03 | Disposition: A | Discharge status: Home / Self Care | Payer: Medicare Other | Source: Ambulatory Visit | Attending: Primary Care | Admitting: Primary Care

## 2020-01-03 DIAGNOSIS — J849 Interstitial pulmonary disease, unspecified: Secondary | ICD-10-CM | POA: Insufficient documentation

## 2020-01-03 LAB — POCT INR: INR: 2.5 (ref 2.0–3.0)

## 2020-01-04 ENCOUNTER — Encounter: Payer: Self-pay | Admitting: Cardiovascular Disease

## 2020-01-04 ENCOUNTER — Ambulatory Visit (INDEPENDENT_AMBULATORY_CARE_PROVIDER_SITE_OTHER): Payer: Medicare Other

## 2020-01-04 DIAGNOSIS — Z952 Presence of prosthetic heart valve: Secondary | ICD-10-CM

## 2020-01-04 DIAGNOSIS — I4811 Longstanding persistent atrial fibrillation: Secondary | ICD-10-CM | POA: Diagnosis not present

## 2020-01-04 DIAGNOSIS — Z5181 Encounter for therapeutic drug level monitoring: Secondary | ICD-10-CM

## 2020-01-04 NOTE — Patient Instructions (Signed)
Sent MyChart message to pt and advised him to : - continue to take 1 tablet of warfarin daily.  - Recheck INR in 1 week on Monday

## 2020-01-09 ENCOUNTER — Ambulatory Visit (INDEPENDENT_AMBULATORY_CARE_PROVIDER_SITE_OTHER): Payer: Medicare Other

## 2020-01-09 DIAGNOSIS — Z5181 Encounter for therapeutic drug level monitoring: Secondary | ICD-10-CM

## 2020-01-09 DIAGNOSIS — Z952 Presence of prosthetic heart valve: Secondary | ICD-10-CM

## 2020-01-09 DIAGNOSIS — I4811 Longstanding persistent atrial fibrillation: Secondary | ICD-10-CM

## 2020-01-09 LAB — LAB REPORT - SCANNED: INR: 2.4

## 2020-01-09 LAB — POCT INR: INR: 2.4 (ref 2.0–3.0)

## 2020-01-09 NOTE — Patient Instructions (Signed)
Sent MyChart message to pt and advised him to : - take 2 tablets today, then - continue to take 1 tablet of warfarin daily.  - Recheck INR in 1 week on Monday

## 2020-01-10 ENCOUNTER — Other Ambulatory Visit: Payer: Self-pay | Admitting: *Deleted

## 2020-01-10 DIAGNOSIS — I7025 Atherosclerosis of native arteries of other extremities with ulceration: Secondary | ICD-10-CM

## 2020-01-10 DIAGNOSIS — I739 Peripheral vascular disease, unspecified: Secondary | ICD-10-CM

## 2020-01-17 LAB — POCT INR: INR: 2.1 (ref 2.0–3.0)

## 2020-01-18 ENCOUNTER — Ambulatory Visit (INDEPENDENT_AMBULATORY_CARE_PROVIDER_SITE_OTHER): Payer: Medicare Other

## 2020-01-18 ENCOUNTER — Encounter: Payer: Self-pay | Admitting: Cardiovascular Disease

## 2020-01-18 DIAGNOSIS — Z952 Presence of prosthetic heart valve: Secondary | ICD-10-CM | POA: Diagnosis not present

## 2020-01-18 DIAGNOSIS — Z5181 Encounter for therapeutic drug level monitoring: Secondary | ICD-10-CM

## 2020-01-18 DIAGNOSIS — I4811 Longstanding persistent atrial fibrillation: Secondary | ICD-10-CM | POA: Diagnosis not present

## 2020-01-18 NOTE — Patient Instructions (Signed)
Sent MyChart message to pt and advised him to : - take 2 tablets today & tomorrow,  then - continue to take 1 tablet of warfarin daily.  - Recheck INR in 1 week on Monday

## 2020-01-20 NOTE — Progress Notes (Signed)
Can you let patient know his CT chest showed mild changes to lungs which appear stable since prior exam in Nov 2020, these changes likely reflect prior infection or inflammation. No definitive findings of ILD. He does have 3 vessel coronary artery calcifications, recommend discuss with cardiology at his visit with them on oct 26th.

## 2020-01-24 LAB — POCT INR: INR: 2.5 (ref 2.0–3.0)

## 2020-01-24 NOTE — Telephone Encounter (Signed)
Per Thurmond Butts with [facility], patient's last noted pressure is 13cm. This was from 2011. Patient does not think cpap has been adjusted since.   Beth, would you like to order new cpap pressure 13cm h2O?

## 2020-01-24 NOTE — Telephone Encounter (Signed)
If he feels well with this pressure yes please send in order for 13cm h20

## 2020-01-25 ENCOUNTER — Encounter: Payer: Self-pay | Admitting: Cardiovascular Disease

## 2020-01-25 ENCOUNTER — Ambulatory Visit (INDEPENDENT_AMBULATORY_CARE_PROVIDER_SITE_OTHER): Payer: Medicare Other

## 2020-01-25 DIAGNOSIS — I4811 Longstanding persistent atrial fibrillation: Secondary | ICD-10-CM | POA: Diagnosis not present

## 2020-01-25 DIAGNOSIS — Z5181 Encounter for therapeutic drug level monitoring: Secondary | ICD-10-CM

## 2020-01-25 DIAGNOSIS — Z952 Presence of prosthetic heart valve: Secondary | ICD-10-CM

## 2020-01-25 NOTE — Patient Instructions (Signed)
Sent MyChart message to pt and advised him to :  - continue to take 1 tablet of warfarin daily.  - don't have any greens for the next day or 2 - Recheck INR in 1 week on Monday

## 2020-01-31 ENCOUNTER — Other Ambulatory Visit: Payer: Self-pay

## 2020-01-31 ENCOUNTER — Ambulatory Visit (HOSPITAL_COMMUNITY)
Admission: RE | Admit: 2020-01-31 | Discharge: 2020-01-31 | Disposition: A | Discharge status: Home / Self Care | Payer: Medicare Other | Source: Ambulatory Visit | Attending: Vascular Surgery | Admitting: Vascular Surgery

## 2020-01-31 ENCOUNTER — Ambulatory Visit (INDEPENDENT_AMBULATORY_CARE_PROVIDER_SITE_OTHER)
Admission: RE | Admit: 2020-01-31 | Discharge: 2020-01-31 | Disposition: A | Discharge status: Home / Self Care | Payer: Medicare Other | Source: Ambulatory Visit | Attending: Vascular Surgery | Admitting: Vascular Surgery

## 2020-01-31 ENCOUNTER — Encounter: Payer: Self-pay | Admitting: Vascular Surgery

## 2020-01-31 ENCOUNTER — Ambulatory Visit (INDEPENDENT_AMBULATORY_CARE_PROVIDER_SITE_OTHER): Payer: Medicare Other | Admitting: Vascular Surgery

## 2020-01-31 VITALS — BP 127/82 | HR 113 | Temp 97.5°F | Resp 16 | Ht 70.0 in | Wt 170.0 lb

## 2020-01-31 DIAGNOSIS — I739 Peripheral vascular disease, unspecified: Secondary | ICD-10-CM

## 2020-01-31 DIAGNOSIS — I7025 Atherosclerosis of native arteries of other extremities with ulceration: Secondary | ICD-10-CM | POA: Diagnosis present

## 2020-01-31 NOTE — Progress Notes (Signed)
Patient name: Jeremy [name] MRN: [id number] DOB: October 31, 1955 Sex: male  REASON FOR VISIT: Follow-up after recent right leg intervention for CLI with tissue loss  HPI: Jeremy [name] is a 64 y.o. male with multiple medical problems including atrial fibrillation on Coumadin, peripheral arterial disease, coronary artery disease status post CABG and subsequent PCI, aortic stenosis with mechanical aortic valve, ESRD, hypertension, hyperlipidemia that presents for  follow-up of right foot ulcer.  He was previously seen by Dr. Donnetta Hutching for a second opinion and since Dr. Donnetta Hutching moved to [place] I was asked to follow his care here in [place].    The right foot got worse and we subsequently intervened with a retrograde right PT intervention and angioplasty of the PT TP trunk on 12/22/2019.  He feels the toe has made some good progress after intervention while using Santyl.  He has a complex recent history and had an area of nonhealing ulceration in his right foot and on 08/25/2019 underwent arteriography via the left femoral approach at [facility]. He had angioplasty of the tibioperoneal trunk, peroneal and posterior tibial arteries on the right with drug-eluting balloon. 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 Apparently had complication of bleeding follow this and was taken to the [facility] lab and underwent Viabahn stenting of his deep femoral artery, superficial femoral artery and common femoral artery for control of bleeding at [place].   Past Medical History:  Diagnosis Date  . Abnormal stress test 03/19/2018  . 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 Aortic stenosis    a. 2014 s/p mech AVR ([facility])-->chronic coumadin; b. 08/2018 Echo: Mild AS/AI. Nl fxn/gradient.  . Arthritis   . Atrial fibrillation ([place])   . CAD (coronary artery disease)    a. 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 02/2018 MV [facility]): large inf, infsept, inflat infarct w/ min rev, EF 30%; c. 06/2018 Cath: LM min irregs, LAD 60p, 95/30/59m, LCX min irregs, RCA 40p, 62m, 90d, RPAV 90, RPDA 95ost; d. 09/2018 CABGx3 (LIMA->LAD, VG->D1, VG->PDA).  . Chronic anticoagulation 06/2012   a. Coumadin in the setting of mech AVR and afib.  Marland Kitchen COVID-19 03/15/2019  . CPAP (continuous positive airway pressure) dependence   . Dialysis patient [facility]) 04/07/2006   Patient started HD around 1996,  had 1st transplant LLQ around 1998 until 2007 when they found renal cell cancer in transplant and both native kidneys, all were removed > went back on HD until 2nd transplant RLQ in 2014 which lasted 3 yrs; it was embolized for suspected acute rejection. Is currently on HD MWF in [place] w/ [facility] nephrology.  . ESRD (end stage renal disease) ([place])    a. 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 1999 s/p Kidney transplant; c. 2014 Bilat nephrectomy (transplanted kidney resected) in the setting of renal cell carcinoma; d. PD until 11/2017-->HD.  Marland Kitchen Heart murmur   . HFrEF (heart failure with reduced ejection fraction) ([place])    a. 07/2016 Echo: EF 55-60%; b. 02/2018 MV: EF 30%; c. 04/2018 Echo: EF 35%; d. 08/2018 Echo: EF 30-35%. Glob HK w/o rwma. Mildly reduced RV fxn. Mod to sev dil LA. Nl AoV fxn.  Marland Kitchen Hx of colonoscopy    2009 by Dr. Laural Golden. Normal except for small submucosal lipoma. No evidence of polyps or colitis.  . Hyperlipidemia   . Hypertension   . Hypogonadism  in male 07/25/2015  . Ischemic cardiomyopathy    a. 07/2016 Echo: EF 55-60%; b. 02/2018 MV: EF 30%; c. 04/2018 Echo: EF 35%; d. 08/2018 Echo: EF 30-35%.  . OSA (obstructive sleep apnea)    No CPAP  . Permanent atrial fibrillation ([facility])    a. CHA2DS2VASc = 3-->Coumadin (also mech AVR); b. 06/2018 Amio started 2/2 elevated rates.  . Renal cell cancer ([place])   . Renal cell carcinoma ([place]) 08/04/2016    Past Surgical History:  Procedure Laterality Date  . ABDOMINAL AORTOGRAM  W/LOWER EXTREMITY N/A 12/22/2019   Procedure: ABDOMINAL AORTOGRAM W/LOWER EXTREMITY;  Surgeon: Marty Heck, MD;  Location: [place] CV [facility];  Service: Cardiovascular;  Laterality: N/A;  . AORTIC VALVE REPLACEMENT  3 /11 /2014   At [place]    . CAPD INSERTION N/A 02/19/2017   Procedure: LAPAROSCOPIC INSERTION CONTINUOUS AMBULATORY PERITONEAL DIALYSIS  (CAPD) CATHETER;  Surgeon: Algernon Huxley, MD;  Location: [facility] ORS;  Service: General;  Laterality: N/A;  . CORONARY ANGIOPLASTY WITH STENT PLACEMENT    . CORONARY ARTERY BYPASS GRAFT N/A 09/16/2018   Procedure: CORONARY ARTERY BYPASS GRAFT times three      with left internal mammary artery and endoharvest of right leg greater saphenous vein;  Surgeon: Melrose Nakayama, MD;  Location: [place];  Service: Open Heart Surgery;  Laterality: N/A;  . CORONARY STENT INTERVENTION N/A 07/15/2019   Procedure: CORONARY STENT INTERVENTION;  Surgeon: Wellington Hampshire, MD;  Location: [place] CV LAB;  Service: Cardiovascular;  Laterality: N/A;  . DG AV DIALYSIS  SHUNT ACCESS EXIST*L* OR     left arm  . EPIGASTRIC HERNIA REPAIR N/A 02/19/2017   Procedure: HERNIA REPAIR EPIGASTRIC ADULT;  Surgeon: Robert Bellow, MD;  Location: [facility] ORS;  Service: General;  Laterality: N/A;  . EYE SURGERY     bilateral cataract  . HERNIA REPAIR     UMBILICAL HERNIA  . HERNIA REPAIR  02/19/2017  . INNER EAR SURGERY     20 years ago  . KIDNEY TRANSPLANT    . LEFT HEART CATH AND CORS/GRAFTS ANGIOGRAPHY N/A 07/15/2019   Procedure: LEFT HEART CATH AND CORS/GRAFTS ANGIOGRAPHY;  Surgeon: Wellington Hampshire, MD;  Location: [place] CV [facility];  Service: Cardiovascular;  Laterality: N/A;  . LOWER EXTREMITY ANGIOGRAPHY Right 08/25/2019   Procedure: LOWER EXTREMITY ANGIOGRAPHY;  Surgeon: Algernon Huxley, MD;  Location: [place] CV LAB;  Service: Cardiovascular;  Laterality: Right;  . LOWER EXTREMITY ANGIOGRAPHY Left 09/29/2019     Procedure: Lower Extremity Angiography;  Surgeon: Algernon Huxley, MD;  Location: [place] CV LAB;  Service: Cardiovascular;  Laterality: Left;  . PERIPHERAL VASCULAR INTERVENTION Right 12/22/2019   Procedure: PERIPHERAL VASCULAR INTERVENTION;  Surgeon: Marty Heck, MD;  Location: [place] CV LAB;  Service: Cardiovascular;  Laterality: Right;  . Peritoneal Dialysis access placement  02/19/2017  . PSEUDOANERYSM COMPRESSION N/A 09/29/2019   Procedure: PSEUDOANERYSM COMPRESSION;  Surgeon: Algernon Huxley, MD;  Location: [place] [facility];  Service: Cardiovascular;  Laterality: N/A;  . REMOVAL OF A DIALYSIS CATHETER N/A 11/23/2017   Procedure: REMOVAL OF A PERITONEAL DIALYSIS CATHETER;  Surgeon: Algernon Huxley, MD;  Location: [facility] ORS;  Service: Vascular;  Laterality: N/A;  . RIGHT/LEFT HEART CATH AND CORONARY ANGIOGRAPHY Bilateral 06/07/2018   Procedure: RIGHT/LEFT HEART CATH AND CORONARY ANGIOGRAPHY;  Surgeon: Wellington Hampshire, MD;  Location: [place] [facility];  Service: Cardiovascular;  Laterality: Bilateral;  . TEE WITHOUT CARDIOVERSION N/A 07/21/2016   Procedure: TRANSESOPHAGEAL ECHOCARDIOGRAM (TEE);  Surgeon: Wellington Hampshire, MD;  Location: [facility] ORS;  Service: Cardiovascular;  Laterality: N/A;  . TEE WITHOUT CARDIOVERSION N/A 09/16/2018   Procedure: TRANSESOPHAGEAL ECHOCARDIOGRAM (TEE);  Surgeon: Melrose Nakayama, MD;  Location: [place];  Service: Open Heart Surgery;  Laterality: N/A;    Family History  Problem Relation Age of Onset  . Cancer Mother        pancreatic  . Heart disease Father   . Diabetes Father     SOCIAL HISTORY: Social History   Tobacco Use  . Smoking status: Never Smoker  . Smokeless tobacco: Never Used  Substance Use Topics  . Alcohol use: No    Allergies  Allergen Reactions  . Statins Other (See Comments)    Other reaction(s): Arthralgias (intolerance), Myalgias (intolerance)  Pt taking pravastatin   . Sertraline Rash    Current  Outpatient Medications  Medication Sig Dispense Refill  . acetaminophen (TYLENOL) 500 MG tablet Take 1,000 mg 2 (two) times daily as needed by mouth for moderate pain.    Marland Kitchen allopurinol (ZYLOPRIM) 300 MG tablet Take 300 mg by mouth daily.  5  . azelastine (ASTELIN) 0.1 % nasal spray PLACE 1 SPRAY INTO BOTH NOSTRILS 2 (TWO) TIMES DAILY. USE IN EACH NOSTRIL AS DIRECTED 30 mL 3  . cholecalciferol (VITAMIN D3) 25 MCG (1000 UNIT) tablet Take 1,000 Units by mouth daily.    . cinacalcet (SENSIPAR) 30 MG tablet Take 30 mg by mouth daily.     . clopidogrel (PLAVIX) 75 MG tablet Take 1 tablet (75 mg total) by mouth daily with breakfast. 90 tablet 3  . Epoetin Alfa-epbx (RETACRIT IJ) Epoetin alfa - epbx (Retacrit)    . hydroxychloroquine (PLAQUENIL) 200 MG tablet Take 200 mg by mouth 2 (two) times daily.    Marland Kitchen lidocaine-prilocaine (EMLA) cream Apply 1 application topically as needed (for fistula). Monday, Wednesday, Friday for dialysis    . loratadine (CLARITIN) 10 MG tablet Take 10 mg by mouth daily.    . metoprolol tartrate (LOPRESSOR) 100 MG tablet Take 1 tablet (100 mg total) by mouth 2 (two) times daily. Patient to call when refill needed. 180 tablet 3  . midodrine (PROAMATINE) 10 MG tablet Take 10 mg by mouth 3 (three) times a week. Mon, Wed, Friday    . multivitamin (RENA-VIT) TABS tablet Take 1 tablet by mouth daily.    . pantoprazole (PROTONIX) 40 MG tablet Take 1 tablet (40 mg total) by mouth daily. 90 tablet 3  . predniSONE (DELTASONE) 1 MG tablet Take 4 mg by mouth daily with breakfast.    . rosuvastatin (CRESTOR) 40 MG tablet Take 1 tablet (40 mg total) by mouth daily at 6 PM. 90 tablet 3  . sevelamer carbonate (RENVELA) 800 MG tablet Take 3,200 mg by mouth 3 (three) times daily with meals. 4 tabs per meal    . silver sulfADIAZINE (SILVADENE) 1 % cream Apply 1 application topically daily. Apply to right great toe and heel    . traZODone (DESYREL) 50 MG tablet TAKE 1/2 -1 TABLET (25-50 MG TOTAL)  BY MOUTH AT BEDTIME AS NEEDED FOR SLEEP. (Patient taking differently: Take 50 mg by mouth at bedtime as needed for sleep. ) 90 tablet 2  . warfarin (COUMADIN) 1 MG tablet Take 1 mg by mouth daily.    Marland Kitchen zinc sulfate 220 (50 Zn) MG capsule Take 220 mg by mouth daily.    Marland Kitchen  benzonatate (TESSALON) 200 MG capsule Take 1 capsule (200 mg total) by mouth 3 (three) times daily as needed for cough. (Patient not taking: Reported on 01/31/2020) 30 capsule 1  . ezetimibe (ZETIA) 10 MG tablet Take 1 tablet (10 mg total) by mouth daily. 90 tablet 3  . nitroGLYCERIN (NITROSTAT) 0.4 MG SL tablet Place 1 tablet (0.4 mg total) under the tongue every 5 (five) minutes as needed for chest pain. 25 tablet 3   Current Facility-Administered Medications  Medication Dose Route Frequency Provider Last Rate Last Admin  . 0.9 %  sodium chloride infusion  250 mL Intravenous PRN Marty Heck, MD        REVIEW OF SYSTEMS:  [X]  denotes positive finding, [ ]  denotes negative finding Cardiac  Comments:  Chest pain or chest pressure:    Shortness of breath upon exertion:    Short of breath when lying flat:    Irregular heart rhythm:        Vascular    Pain in calf, thigh, or hip brought on by ambulation:    Pain in feet at night that wakes you up from your sleep:     Blood clot in your veins:    Leg swelling:         Pulmonary    Oxygen at home:    Productive cough:     Wheezing:         Neurologic    Sudden weakness in arms or legs:     Sudden numbness in arms or legs:     Sudden onset of difficulty speaking or slurred speech:    Temporary loss of vision in one eye:     Problems with dizziness:         Gastrointestinal    Blood in stool:     Vomited blood:         Genitourinary    Burning when urinating:     Blood in urine:        Psychiatric    Major depression:         Hematologic    Bleeding problems:    Problems with blood clotting too easily:        Skin    Rashes or ulcers:          Constitutional    Fever or chills:      PHYSICAL EXAM: Vitals:   01/31/20 1517  BP: 127/82  Pulse: (!) 113  Resp: 16  Temp: (!) 97.5 F (36.4 C)  TempSrc: Temporal  SpO2: 100%  Weight: 170 lb (77.1 kg)  Height: 5\' 10"  (1.778 m)    GENERAL: The patient is a well-nourished male, in no acute distress. The vital signs are documented above. CARDIAC: There is a regular rate and rhythm.  VASCULAR:  Right PT brisk by Doppler Ulceration to tip of right great toe is pictured below      DATA:   Right leg arterial duplex shows PT intervention is patent with mid PT stenosis  Assessment/Plan:  64 year old male status post right leg intervention with retrograde PT/TP trunk angioplasty for CLI with tissue loss on 12/22/19.  Toe has made some progress as pictured above and has some granulation tissue now.  He has a very brisk PT signal on exam.  I think he is optimized from a vascular surgery standpoint and I am hoping that his toe will continue to show progress.  Will be high risk for limb loss even with maximal intervention.  We will see him again in 1 month for wound check.   Marty Heck, MD [facility] Office: [phone]

## 2020-02-01 ENCOUNTER — Encounter: Payer: Self-pay | Admitting: Cardiovascular Disease

## 2020-02-01 ENCOUNTER — Ambulatory Visit (INDEPENDENT_AMBULATORY_CARE_PROVIDER_SITE_OTHER): Payer: Medicare Other

## 2020-02-01 DIAGNOSIS — Z952 Presence of prosthetic heart valve: Secondary | ICD-10-CM | POA: Diagnosis not present

## 2020-02-01 DIAGNOSIS — I4811 Longstanding persistent atrial fibrillation: Secondary | ICD-10-CM | POA: Diagnosis not present

## 2020-02-01 DIAGNOSIS — Z5181 Encounter for therapeutic drug level monitoring: Secondary | ICD-10-CM | POA: Diagnosis not present

## 2020-02-01 LAB — POCT INR
INR: 2.3 (ref 2.0–3.0)
INR: 2.3 (ref 2.0–3.0)

## 2020-02-01 NOTE — Patient Instructions (Signed)
Sent MyChart message to pt and advised him to :  - take 2 tablets today and tomorrow, then  - continue to take 1 tablet of warfarin daily.  - Recheck INR in 1 week

## 2020-02-08 ENCOUNTER — Ambulatory Visit (INDEPENDENT_AMBULATORY_CARE_PROVIDER_SITE_OTHER): Payer: Medicare Other

## 2020-02-08 ENCOUNTER — Telehealth: Payer: Self-pay | Admitting: Cardiovascular Disease

## 2020-02-08 DIAGNOSIS — I4891 Unspecified atrial fibrillation: Secondary | ICD-10-CM

## 2020-02-08 DIAGNOSIS — Z952 Presence of prosthetic heart valve: Secondary | ICD-10-CM | POA: Diagnosis not present

## 2020-02-08 DIAGNOSIS — Z5181 Encounter for therapeutic drug level monitoring: Secondary | ICD-10-CM | POA: Diagnosis not present

## 2020-02-08 LAB — POCT INR: INR: 3.9 — AB (ref 2.0–3.0)

## 2020-02-08 NOTE — Patient Instructions (Signed)
Sent MyChart message to pt and advised him to :  - skip warfarin today, then  - continue to take 1 tablet of warfarin daily.  - Recheck INR in 1 week

## 2020-02-08 NOTE — Telephone Encounter (Signed)
Patient reporting home poct inr is 3.9

## 2020-02-08 NOTE — Telephone Encounter (Signed)
Please see anti-cog note for today.

## 2020-02-10 ENCOUNTER — Encounter: Payer: Self-pay | Admitting: Cardiovascular Disease

## 2020-02-13 NOTE — Telephone Encounter (Signed)
This encounter was created in error - please disregard.

## 2020-02-14 ENCOUNTER — Encounter: Payer: Self-pay | Admitting: Cardiovascular Disease

## 2020-02-14 LAB — LAB REPORT - SCANNED: INR: 3.2

## 2020-02-14 LAB — POCT INR: INR: 3.2 — AB (ref 2.0–3.0)

## 2020-02-15 ENCOUNTER — Ambulatory Visit (INDEPENDENT_AMBULATORY_CARE_PROVIDER_SITE_OTHER): Payer: Medicare Other

## 2020-02-15 DIAGNOSIS — Z5181 Encounter for therapeutic drug level monitoring: Secondary | ICD-10-CM | POA: Diagnosis not present

## 2020-02-15 DIAGNOSIS — I4811 Longstanding persistent atrial fibrillation: Secondary | ICD-10-CM

## 2020-02-15 DIAGNOSIS — Z952 Presence of prosthetic heart valve: Secondary | ICD-10-CM

## 2020-02-15 NOTE — Patient Instructions (Signed)
Sent MyChart message to pt and advised him to :  - continue to take 1 tablet of warfarin daily.  - Recheck INR in 1 week

## 2020-02-21 ENCOUNTER — Other Ambulatory Visit: Payer: Self-pay

## 2020-02-21 LAB — POCT INR: INR: 2.8 (ref 2.0–3.0)

## 2020-02-21 MED ORDER — METOPROLOL TARTRATE 100 MG PO TABS: 100.0000 mg | ORAL_TABLET | Freq: Two times a day (BID) | ORAL | 0 refills | Status: DC

## 2020-02-22 ENCOUNTER — Ambulatory Visit (INDEPENDENT_AMBULATORY_CARE_PROVIDER_SITE_OTHER): Payer: Medicare Other

## 2020-02-22 ENCOUNTER — Encounter: Payer: Self-pay | Admitting: Cardiovascular Disease

## 2020-02-22 ENCOUNTER — Other Ambulatory Visit: Payer: Self-pay

## 2020-02-22 DIAGNOSIS — Z5181 Encounter for therapeutic drug level monitoring: Secondary | ICD-10-CM

## 2020-02-22 DIAGNOSIS — Z952 Presence of prosthetic heart valve: Secondary | ICD-10-CM

## 2020-02-22 DIAGNOSIS — I4811 Longstanding persistent atrial fibrillation: Secondary | ICD-10-CM | POA: Diagnosis not present

## 2020-02-22 MED ORDER — METOPROLOL TARTRATE 100 MG PO TABS: 100.0000 mg | ORAL_TABLET | Freq: Two times a day (BID) | ORAL | 0 refills | Status: DC

## 2020-02-22 NOTE — Patient Instructions (Signed)
Sent MyChart message to pt and advised him to :  - continue to take 1 tablet of warfarin daily.  - Recheck INR in 1 week

## 2020-02-24 ENCOUNTER — Encounter: Payer: Self-pay | Admitting: Oncology

## 2020-02-24 NOTE — Telephone Encounter (Signed)
Can you ask if he had ct abdomen and if so where? I can only see ct chest in sept 2021

## 2020-02-27 ENCOUNTER — Ambulatory Visit: Payer: Medicare Other

## 2020-02-29 ENCOUNTER — Encounter: Payer: Self-pay | Admitting: Cardiovascular Disease

## 2020-02-29 ENCOUNTER — Ambulatory Visit (INDEPENDENT_AMBULATORY_CARE_PROVIDER_SITE_OTHER): Payer: Medicare Other

## 2020-02-29 DIAGNOSIS — I4811 Longstanding persistent atrial fibrillation: Secondary | ICD-10-CM

## 2020-02-29 DIAGNOSIS — Z952 Presence of prosthetic heart valve: Secondary | ICD-10-CM

## 2020-02-29 DIAGNOSIS — Z5181 Encounter for therapeutic drug level monitoring: Secondary | ICD-10-CM | POA: Diagnosis not present

## 2020-02-29 LAB — LAB REPORT - SCANNED
INR: 2.6
INR: 2.6

## 2020-02-29 LAB — POCT INR: INR: 2.6 (ref 2.0–3.0)

## 2020-02-29 NOTE — Patient Instructions (Signed)
Sent MyChart message to pt and advised him to :  - continue to take 1 tablet of warfarin daily.  - Recheck INR in 1 week

## 2020-03-05 ENCOUNTER — Encounter: Payer: Self-pay | Admitting: Cardiovascular Disease

## 2020-03-06 ENCOUNTER — Ambulatory Visit (INDEPENDENT_AMBULATORY_CARE_PROVIDER_SITE_OTHER): Payer: Medicare Other | Admitting: Vascular Surgery

## 2020-03-06 ENCOUNTER — Encounter: Payer: Self-pay | Admitting: Vascular Surgery

## 2020-03-06 ENCOUNTER — Other Ambulatory Visit: Payer: Self-pay

## 2020-03-06 ENCOUNTER — Ambulatory Visit
Admission: RE | Admit: 2020-03-06 | Discharge: 2020-03-06 | Disposition: A | Discharge status: Home / Self Care | Payer: Medicare Other | Source: Ambulatory Visit | Attending: Oncology | Admitting: Oncology

## 2020-03-06 VITALS — BP 120/79 | HR 114 | Temp 97.8°F | Resp 18 | Ht 70.0 in | Wt 171.0 lb

## 2020-03-06 DIAGNOSIS — Z85528 Personal history of other malignant neoplasm of kidney: Secondary | ICD-10-CM | POA: Diagnosis present

## 2020-03-06 DIAGNOSIS — I739 Peripheral vascular disease, unspecified: Secondary | ICD-10-CM

## 2020-03-06 MED ORDER — OXYCODONE HCL 5 MG PO TABS
5.0000 mg | ORAL_TABLET | Freq: Three times a day (TID) | ORAL | 0 refills | Status: DC | PRN
Start: 2020-03-06 — End: 2020-04-10

## 2020-03-06 NOTE — Progress Notes (Signed)
Patient name: Jeremy [name] MRN: [id number] DOB: 1955/07/15 Sex: male  REASON FOR VISIT: 1 month follow-up for wound check of right great toe for CLI with tissue loss  HPI: Jeremy [name] is a 64 y.o. male with multiple medical problems including atrial fibrillation on Coumadin, peripheral arterial disease, coronary artery disease status post CABG and subsequent PCI, aortic stenosis with mechanical aortic valve, ESRD, hypertension, hyperlipidemia that presents for 1 month follow-up and wound check of right foot ulcer.  He was previously seen by Dr. Donnetta Hutching for a second opinion and since Dr. Donnetta Hutching moved to [place] I was asked to follow his care here in [place].    The right foot got worse and we subsequently intervened with a retrograde right PT intervention and angioplasty of the PT TP trunk on 12/22/2019.  He is keeping Santyl on the toe prescribed by his podiatrist.  He is having some more pain in the foot but the toe ulcer has remained very dry on the right great toe.  He has a complex recent history and had an area of nonhealing ulceration in his right foot and on 08/25/2019 underwent arteriography via the left femoral approach at [facility]. He had angioplasty of the tibioperoneal trunk, peroneal and posterior tibial arteries on the right with drug-eluting balloon. 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 Apparently had complication of bleeding follow this and was taken to the [facility] lab and underwent Viabahn stenting of his deep femoral artery, superficial femoral artery and common femoral artery for control of bleeding at [place].   Past Medical History:  Diagnosis Date  . Abnormal stress test 03/19/2018  . 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 Aortic stenosis    a. 2014 s/p mech AVR ([facility])-->chronic coumadin; b. 08/2018 Echo: Mild AS/AI. Nl fxn/gradient.  . Arthritis   . Atrial fibrillation ([place])   . CAD (coronary artery  disease)    a. 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 02/2018 MV [facility]): large inf, infsept, inflat infarct w/ min rev, EF 30%; c. 06/2018 Cath: LM min irregs, LAD 60p, 95/30/29m, LCX min irregs, RCA 40p, 37m, 90d, RPAV 90, RPDA 95ost; d. 09/2018 CABGx3 (LIMA->LAD, VG->D1, VG->PDA).  . Chronic anticoagulation 06/2012   a. Coumadin in the setting of mech AVR and afib.  Marland Kitchen COVID-19 03/15/2019  . CPAP (continuous positive airway pressure) dependence   . Dialysis patient [facility]) 04/07/2006   Patient started HD around 1996,  had 1st transplant LLQ around 1998 until 2007 when they found renal cell cancer in transplant and both native kidneys, all were removed > went back on HD until 2nd transplant RLQ in 2014 which lasted 3 yrs; it was embolized for suspected acute rejection. Is currently on HD MWF in [place] w/ [facility] nephrology.  . ESRD (end stage renal disease) ([place])    a. 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 1999 s/p Kidney transplant; c. 2014 Bilat nephrectomy (transplanted kidney resected) in the setting of renal cell carcinoma; d. PD until 11/2017-->HD.  Marland Kitchen Heart murmur   . HFrEF (heart failure with reduced ejection fraction) ([place])    a. 07/2016 Echo: EF 55-60%; b. 02/2018 MV: EF 30%; c. 04/2018 Echo: EF 35%; d. 08/2018 Echo: EF 30-35%. Glob HK w/o rwma. Mildly reduced RV fxn. Mod to sev dil LA. Nl AoV fxn.  Marland Kitchen Hx of colonoscopy  2009 by Dr. Laural Golden. Normal except for small submucosal lipoma. No evidence of polyps or colitis.  . Hyperlipidemia   . Hypertension   . Hypogonadism in male 07/25/2015  . Ischemic cardiomyopathy    a. 07/2016 Echo: EF 55-60%; b. 02/2018 MV: EF 30%; c. 04/2018 Echo: EF 35%; d. 08/2018 Echo: EF 30-35%.  . OSA (obstructive sleep apnea)    No CPAP  . Permanent atrial fibrillation (H[facility])    a. CHA2DS2VASc = 3-->Coumadin (also mech AVR); b. 06/2018 Amio started 2/2 elevated rates.  . Renal cell cancer ([place])   .  Renal cell carcinoma ([place]) 08/04/2016    Past Surgical History:  Procedure Laterality Date  . ABDOMINAL AORTOGRAM W/LOWER EXTREMITY N/A 12/22/2019   Procedure: ABDOMINAL AORTOGRAM W/LOWER EXTREMITY;  Surgeon: Marty Heck, MD;  Location: [facility];  Service: Cardiovascular;  Laterality: N/A;  . AORTIC VALVE REPLACEMENT  3 /11 /2014   At [place]    . CAPD INSERTION N/A 02/19/2017   Procedure: LAPAROSCOPIC INSERTION CONTINUOUS AMBULATORY PERITONEAL DIALYSIS  (CAPD) CATHETER;  Surgeon: Algernon Huxley, MD;  Location: [facility];  Service: General;  Laterality: N/A;  . CORONARY ANGIOPLASTY WITH STENT PLACEMENT    . CORONARY ARTERY BYPASS GRAFT N/A 09/16/2018   Procedure: CORONARY ARTERY BYPASS GRAFT times three      with left internal mammary artery and endoharvest of right leg greater saphenous vein;  Surgeon: Melrose Nakayama, MD;  Location: [facility];  Service: Open Heart Surgery;  Laterality: N/A;  . CORONARY STENT INTERVENTION N/A 07/15/2019   Procedure: CORONARY STENT INTERVENTION;  Surgeon: Wellington Hampshire, MD;  Location: [place] [facility];  Service: Cardiovascular;  Laterality: N/A;  . DG AV DIALYSIS  SHUNT ACCESS EXIST*L* OR     left arm  . EPIGASTRIC HERNIA REPAIR N/A 02/19/2017   Procedure: HERNIA REPAIR EPIGASTRIC ADULT;  Surgeon: Robert Bellow, MD;  Location: [facility];  Service: General;  Laterality: N/A;  . EYE SURGERY     bilateral cataract  . HERNIA REPAIR     UMBILICAL HERNIA  . HERNIA REPAIR  02/19/2017  . INNER EAR SURGERY     20 years ago  . KIDNEY TRANSPLANT    . LEFT HEART CATH AND CORS/GRAFTS ANGIOGRAPHY N/A 07/15/2019   Procedure: LEFT HEART CATH AND CORS/GRAFTS ANGIOGRAPHY;  Surgeon: Wellington Hampshire, MD;  Location: [place] [facility];  Service: Cardiovascular;  Laterality: N/A;  . LOWER EXTREMITY ANGIOGRAPHY Right 08/25/2019   Procedure: LOWER EXTREMITY ANGIOGRAPHY;  Surgeon: Algernon Huxley, MD;   Location: [place] [facility];  Service: Cardiovascular;  Laterality: Right;  . LOWER EXTREMITY ANGIOGRAPHY Left 09/29/2019   Procedure: Lower Extremity Angiography;  Surgeon: Algernon Huxley, MD;  Location: [place] [facility];  Service: Cardiovascular;  Laterality: Left;  . PERIPHERAL VASCULAR INTERVENTION Right 12/22/2019   Procedure: PERIPHERAL VASCULAR INTERVENTION;  Surgeon: Marty Heck, MD;  Location: [place] [facility];  Service: Cardiovascular;  Laterality: Right;  . Peritoneal Dialysis access placement  02/19/2017  . PSEUDOANERYSM COMPRESSION N/A 09/29/2019   Procedure: PSEUDOANERYSM COMPRESSION;  Surgeon: Algernon Huxley, MD;  Location: [place] [facility];  Service: Cardiovascular;  Laterality: N/A;  . REMOVAL OF A DIALYSIS CATHETER N/A 11/23/2017   Procedure: REMOVAL OF A PERITONEAL DIALYSIS CATHETER;  Surgeon: Algernon Huxley, MD;  Location: [facility];  Service: Vascular;  Laterality: N/A;  . RIGHT/LEFT HEART CATH AND CORONARY ANGIOGRAPHY Bilateral  06/07/2018   Procedure: RIGHT/LEFT HEART CATH AND CORONARY ANGIOGRAPHY;  Surgeon: Wellington Hampshire, MD;  Location: [place] [facility];  Service: Cardiovascular;  Laterality: Bilateral;  . TEE WITHOUT CARDIOVERSION N/A 07/21/2016   Procedure: TRANSESOPHAGEAL ECHOCARDIOGRAM (TEE);  Surgeon: Wellington Hampshire, MD;  Location: [facility];  Service: Cardiovascular;  Laterality: N/A;  . TEE WITHOUT CARDIOVERSION N/A 09/16/2018   Procedure: TRANSESOPHAGEAL ECHOCARDIOGRAM (TEE);  Surgeon: Melrose Nakayama, MD;  Location: [facility];  Service: Open Heart Surgery;  Laterality: N/A;    Family History  Problem Relation Age of Onset  . Cancer Mother        pancreatic  . Heart disease Father   . Diabetes Father     SOCIAL HISTORY: Social History   Tobacco Use  . Smoking status: Never Smoker  . Smokeless tobacco: Never Used  Substance Use Topics  . Alcohol use: No    Allergies  Allergen Reactions  . Statins Other (See Comments)    Other  reaction(s): Arthralgias (intolerance), Myalgias (intolerance)  Pt taking pravastatin   . Sertraline Rash    Current Outpatient Medications  Medication Sig Dispense Refill  . acetaminophen (TYLENOL) 500 MG tablet Take 1,000 mg 2 (two) times daily as needed by mouth for moderate pain.    Marland Kitchen allopurinol (ZYLOPRIM) 300 MG tablet Take 300 mg by mouth daily.  5  . azelastine (ASTELIN) 0.1 % nasal spray PLACE 1 SPRAY INTO BOTH NOSTRILS 2 (TWO) TIMES DAILY. USE IN EACH NOSTRIL AS DIRECTED 30 mL 3  . benzonatate (TESSALON) 200 MG capsule Take 1 capsule (200 mg total) by mouth 3 (three) times daily as needed for cough. (Patient not taking: Reported on 01/31/2020) 30 capsule 1  . cholecalciferol (VITAMIN D3) 25 MCG (1000 UNIT) tablet Take 1,000 Units by mouth daily.    . cinacalcet (SENSIPAR) 30 MG tablet Take 30 mg by mouth daily.     . clopidogrel (PLAVIX) 75 MG tablet Take 1 tablet (75 mg total) by mouth daily with breakfast. 90 tablet 3  . Epoetin Alfa-epbx (RETACRIT IJ) Epoetin alfa - epbx (Retacrit)    . ezetimibe (ZETIA) 10 MG tablet Take 1 tablet (10 mg total) by mouth daily. 90 tablet 3  . hydroxychloroquine (PLAQUENIL) 200 MG tablet Take 200 mg by mouth 2 (two) times daily.    Marland Kitchen lidocaine-prilocaine (EMLA) cream Apply 1 application topically as needed (for fistula). Monday, Wednesday, Friday for dialysis    . loratadine (CLARITIN) 10 MG tablet Take 10 mg by mouth daily.    . metoprolol tartrate (LOPRESSOR) 100 MG tablet Take 1 tablet (100 mg total) by mouth 2 (two) times daily. 180 tablet 0  . midodrine (PROAMATINE) 10 MG tablet Take 10 mg by mouth 3 (three) times a week. Mon, Wed, Friday    . multivitamin (RENA-VIT) TABS tablet Take 1 tablet by mouth daily.    . nitroGLYCERIN (NITROSTAT) 0.4 MG SL tablet Place 1 tablet (0.4 mg total) under the tongue every 5 (five) minutes as needed for chest pain. 25 tablet 3  . pantoprazole (PROTONIX) 40 MG tablet Take 1 tablet (40 mg total) by mouth daily.  90 tablet 3  . predniSONE (DELTASONE) 1 MG tablet Take 4 mg by mouth daily with breakfast.    . rosuvastatin (CRESTOR) 40 MG tablet Take 1 tablet (40 mg total) by mouth daily at 6 PM. 90 tablet 3  . sevelamer carbonate (RENVELA) 800 MG tablet Take 3,200 mg by mouth 3 (three) times daily with meals.  4 tabs per meal    . silver sulfADIAZINE (SILVADENE) 1 % cream Apply 1 application topically daily. Apply to right great toe and heel    . traZODone (DESYREL) 50 MG tablet TAKE 1/2 -1 TABLET (25-50 MG TOTAL) BY MOUTH AT BEDTIME AS NEEDED FOR SLEEP. (Patient taking differently: Take 50 mg by mouth at bedtime as needed for sleep. ) 90 tablet 2  . warfarin (COUMADIN) 1 MG tablet Take 1 mg by mouth daily.    Marland Kitchen zinc sulfate 220 (50 Zn) MG capsule Take 220 mg by mouth daily.     Current Facility-Administered Medications  Medication Dose Route Frequency Provider Last Rate Last Admin  . 0.9 %  sodium chloride infusion  250 mL Intravenous PRN Marty Heck, MD        REVIEW OF SYSTEMS:  [X]  denotes positive finding, [ ]  denotes negative finding Cardiac  Comments:  Chest pain or chest pressure:    Shortness of breath upon exertion:    Short of breath when lying flat:    Irregular heart rhythm:        Vascular    Pain in calf, thigh, or hip brought on by ambulation:    Pain in feet at night that wakes you up from your sleep:     Blood clot in your veins:    Leg swelling:         Pulmonary    Oxygen at home:    Productive cough:     Wheezing:         Neurologic    Sudden weakness in arms or legs:     Sudden numbness in arms or legs:     Sudden onset of difficulty speaking or slurred speech:    Temporary loss of vision in one eye:     Problems with dizziness:         Gastrointestinal    Blood in stool:     Vomited blood:         Genitourinary    Burning when urinating:     Blood in urine:        Psychiatric    Major depression:         Hematologic    Bleeding problems:     Problems with blood clotting too easily:        Skin    Rashes or ulcers:        Constitutional    Fever or chills:      PHYSICAL EXAM: Vitals:   03/06/20 0923  BP: 120/79  Pulse: (!) 114  Resp: 18  Temp: 97.8 F (36.6 C)  TempSrc: Temporal  SpO2: 99%  Weight: 171 lb (77.6 kg)  Height: 5\' 10"  (1.778 m)      GENERAL: The patient is a well-nourished male, in no acute distress. The vital signs are documented above. CARDIAC: There is a regular rate and rhythm.  VASCULAR:  Right PT brisk by Doppler Ulceration to tip of right great toe is pictured above remains dry and stable Neuro: Right foot is motor intact.      DATA:   No repeat studies today  Assessment/Plan:  64 year old male status post right leg intervention with retrograde PT/TP trunk angioplasty for CLI with tissue loss on 12/22/19.  This was a second tibial intervention as noted above after a first tibial intervention at [facility] earlier this year.  Toe remains dry as noted above and he is continuing to put Santyl on the toe  which seems to be working well.  He still has a brisk PT signal at the ankle and suspect his tibial intervention remains patent.  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  We discussed tibial interventions being poorly durable long-term and at some point we may have to perform another intervention.  He has been unable to heal the toe wound with multiple tibial interventions and I suspect from small vessel disease.  I previously discussed with him he is at very high risk for limb loss given his tibial disease and significant small vessel disease in the foot but he is not interested in amputation which is understandable.  I will see him again in 1 month for wound check and will get another right leg arterial duplex at that time.  I think he is optimized  from vascular surgery standpoint at this time which we discussed today.  Prescribed some narcotics today as well.  Marty Heck, MD [facility] Office: [phone]

## 2020-03-07 ENCOUNTER — Other Ambulatory Visit: Payer: Self-pay

## 2020-03-07 ENCOUNTER — Ambulatory Visit (INDEPENDENT_AMBULATORY_CARE_PROVIDER_SITE_OTHER): Payer: Medicare Other

## 2020-03-07 ENCOUNTER — Encounter: Payer: Self-pay | Admitting: Cardiovascular Disease

## 2020-03-07 DIAGNOSIS — Z952 Presence of prosthetic heart valve: Secondary | ICD-10-CM | POA: Diagnosis not present

## 2020-03-07 DIAGNOSIS — I4811 Longstanding persistent atrial fibrillation: Secondary | ICD-10-CM

## 2020-03-07 DIAGNOSIS — Z5181 Encounter for therapeutic drug level monitoring: Secondary | ICD-10-CM

## 2020-03-07 DIAGNOSIS — I739 Peripheral vascular disease, unspecified: Secondary | ICD-10-CM

## 2020-03-07 LAB — POCT INR: INR: 3 (ref 2.0–3.0)

## 2020-03-07 LAB — LAB REPORT - SCANNED: INR: 3

## 2020-03-07 NOTE — Patient Instructions (Signed)
Sent MyChart message to pt and advised him to :  - continue to take 1 tablet of warfarin daily.  - Recheck INR in 1 week

## 2020-03-08 ENCOUNTER — Other Ambulatory Visit: Payer: Self-pay

## 2020-03-08 ENCOUNTER — Encounter: Payer: Self-pay | Admitting: Oncology

## 2020-03-08 ENCOUNTER — Inpatient Hospital Stay: Payer: Medicare Other | Attending: Oncology | Admitting: Oncology

## 2020-03-08 VITALS — BP 84/63 | HR 118 | Temp 99.0°F | Resp 18 | Wt 170.0 lb

## 2020-03-08 DIAGNOSIS — N186 End stage renal disease: Secondary | ICD-10-CM | POA: Insufficient documentation

## 2020-03-08 DIAGNOSIS — I350 Nonrheumatic aortic (valve) stenosis: Secondary | ICD-10-CM | POA: Insufficient documentation

## 2020-03-08 DIAGNOSIS — Z79899 Other long term (current) drug therapy: Secondary | ICD-10-CM | POA: Diagnosis not present

## 2020-03-08 DIAGNOSIS — I132 Hypertensive heart and chronic kidney disease with heart failure and with stage 5 chronic kidney disease, or end stage renal disease: Secondary | ICD-10-CM | POA: Diagnosis not present

## 2020-03-08 DIAGNOSIS — I255 Ischemic cardiomyopathy: Secondary | ICD-10-CM | POA: Insufficient documentation

## 2020-03-08 DIAGNOSIS — Z85528 Personal history of other malignant neoplasm of kidney: Secondary | ICD-10-CM | POA: Diagnosis present

## 2020-03-08 DIAGNOSIS — D631 Anemia in chronic kidney disease: Secondary | ICD-10-CM | POA: Diagnosis not present

## 2020-03-08 DIAGNOSIS — I4821 Permanent atrial fibrillation: Secondary | ICD-10-CM | POA: Insufficient documentation

## 2020-03-08 DIAGNOSIS — Z08 Encounter for follow-up examination after completed treatment for malignant neoplasm: Secondary | ICD-10-CM | POA: Diagnosis not present

## 2020-03-08 DIAGNOSIS — I2510 Atherosclerotic heart disease of native coronary artery without angina pectoris: Secondary | ICD-10-CM | POA: Diagnosis not present

## 2020-03-08 DIAGNOSIS — Z7901 Long term (current) use of anticoagulants: Secondary | ICD-10-CM | POA: Insufficient documentation

## 2020-03-08 DIAGNOSIS — G4730 Sleep apnea, unspecified: Secondary | ICD-10-CM | POA: Insufficient documentation

## 2020-03-08 DIAGNOSIS — Z992 Dependence on renal dialysis: Secondary | ICD-10-CM | POA: Diagnosis not present

## 2020-03-08 DIAGNOSIS — E785 Hyperlipidemia, unspecified: Secondary | ICD-10-CM | POA: Diagnosis not present

## 2020-03-08 DIAGNOSIS — I4891 Unspecified atrial fibrillation: Secondary | ICD-10-CM | POA: Insufficient documentation

## 2020-03-13 ENCOUNTER — Other Ambulatory Visit: Payer: Self-pay

## 2020-03-13 ENCOUNTER — Encounter: Payer: Self-pay | Admitting: Cardiovascular Disease

## 2020-03-13 ENCOUNTER — Ambulatory Visit (INDEPENDENT_AMBULATORY_CARE_PROVIDER_SITE_OTHER): Payer: Medicare Other | Admitting: Cardiovascular Disease

## 2020-03-13 VITALS — BP 138/70 | HR 92 | Ht 70.0 in | Wt 176.0 lb

## 2020-03-13 DIAGNOSIS — I482 Chronic atrial fibrillation, unspecified: Secondary | ICD-10-CM

## 2020-03-13 DIAGNOSIS — I2510 Atherosclerotic heart disease of native coronary artery without angina pectoris: Secondary | ICD-10-CM | POA: Diagnosis not present

## 2020-03-13 DIAGNOSIS — I255 Ischemic cardiomyopathy: Secondary | ICD-10-CM | POA: Diagnosis not present

## 2020-03-13 DIAGNOSIS — E785 Hyperlipidemia, unspecified: Secondary | ICD-10-CM | POA: Diagnosis not present

## 2020-03-13 DIAGNOSIS — N186 End stage renal disease: Secondary | ICD-10-CM

## 2020-03-13 DIAGNOSIS — I739 Peripheral vascular disease, unspecified: Secondary | ICD-10-CM

## 2020-03-13 DIAGNOSIS — I5022 Chronic systolic (congestive) heart failure: Secondary | ICD-10-CM

## 2020-03-13 DIAGNOSIS — Z952 Presence of prosthetic heart valve: Secondary | ICD-10-CM

## 2020-03-13 MED ORDER — METOPROLOL TARTRATE 75 MG PO TABS: 75.0000 mg | ORAL_TABLET | Freq: Two times a day (BID) | ORAL | 3 refills | Status: DC

## 2020-03-13 NOTE — Progress Notes (Signed)
Hematology/Oncology Consult note [facility]  [phone] [phone]  Patient Care Team: Leone Haven, MD as PCP - General (Family Medicine) Wellington Hampshire, MD as PCP - Cardiology (Cardiology) Warden Fillers, MD as Consulting Physician (Ophthalmology) Whitney Muse, Kelby Fam, MD (Inactive) as Consulting Physician (Hematology and Oncology) Lucky Cowboy Erskine Squibb, MD as Referring Physician (Vascular Surgery) Robert Bellow, MD (General Surgery)   Name of the patient: Jeremy [name]  [id number]  Jun 05, 1955   Date of visit: 03/13/20  Diagnosis-history of renal cell carcinoma s/p nephrectomy  Chief complaint/ Reason for visit-routine follow-up of renal cell carcinoma  Heme/Onc history: patient is a 64 year old male with a complex past medical history. 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 He was then on dialysis for a few years and went on to receive a second renal transplant.His second kidney transplant got rejected as well and he went back on hemodialysis which he is on presently. He has been following up with Dr. Turner Daniels from medical oncology at [facility] on the last year for his renal cell carcinoma and underwent CT chest abdomen and pelvis without contrast in November 2019 which did not show any evidence of metastatic disease and he remains on clinical surveillance.   He also has other comorbidities including aortic valve replacement for which he is on Coumadin, atrial fibrillation and also underwent three-vessel CABG recently. Patient receives EPO and IV iron through nephrology during dialysis  Interval history-patient is currently doing  well and has not had any recent infections or hospitalizations.  He remains on dialysis and also follows up with cardiology for his Coronary artery disease.  ECOG PS- 1 Pain scale- 0   Review of systems- Review of Systems  Constitutional: Positive for malaise/fatigue. Negative for chills, fever and weight loss.  HENT: Negative for congestion, ear discharge and nosebleeds.   Eyes: Negative for blurred vision.  Respiratory: Negative for cough, hemoptysis, sputum production, shortness of breath and wheezing.   Cardiovascular: Negative for chest pain, palpitations, orthopnea and claudication.  Gastrointestinal: Negative for abdominal pain, blood in stool, constipation, diarrhea, heartburn, melena, nausea and vomiting.  Genitourinary: Negative for dysuria, flank pain, frequency, hematuria and urgency.  Musculoskeletal: Negative for back pain, joint pain and myalgias.  Skin: Negative for rash.  Neurological: Negative for dizziness, tingling, focal weakness, seizures, weakness and headaches.  Endo/Heme/Allergies: Does not bruise/bleed easily.  Psychiatric/Behavioral: Negative for depression and suicidal ideas. The patient does not have insomnia.       Allergies  Allergen Reactions  . Statins Other (See Comments)    Other reaction(s): Arthralgias (intolerance), Myalgias (intolerance)  Pt taking pravastatin   . Sertraline Rash     Past Medical History:  Diagnosis Date  . Abnormal stress test 03/19/2018  . 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 Aortic stenosis    a. 2014 s/p mech AVR ([facility])-->chronic coumadin; b. 08/2018 Echo: Mild AS/AI. Nl fxn/gradient.  . Arthritis   . Atrial fibrillation ([place])   . CAD (coronary artery disease)    a. 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 02/2018 MV [facility]): large inf, infsept, inflat infarct w/ min rev, EF  30%; c. 06/2018 Cath: LM min irregs, LAD 60p, 95/30/88m, LCX min irregs, RCA  40p, 81m, 90d, RPAV 90, RPDA 95ost; d. 09/2018 CABGx3 (LIMA->LAD, VG->D1, VG->PDA).  . Chronic anticoagulation 06/2012   a. Coumadin in the setting of mech AVR and afib.  Marland Kitchen COVID-19 03/15/2019  . CPAP (continuous positive airway pressure) dependence   . Dialysis patient [facility]) 04/07/2006   Patient started HD around 1996,  had 1st transplant LLQ around 1998 until 2007 when they found renal cell cancer in transplant and both native kidneys, all were removed > went back on HD until 2nd transplant RLQ in 2014 which lasted 3 yrs; it was embolized for suspected acute rejection. Is currently on HD MWF in [place] w/ [facility] nephrology.  . ESRD (end stage renal disease) ([place])    a. CKD 2/2 to IgA nephropathy (dx age 34); b. 1999 s/p Kidney transplant; c. 2014 Bilat nephrectomy (transplanted kidney resected) in the setting of renal cell carcinoma; d. PD until 11/2017-->HD.  Marland Kitchen Heart murmur   . HFrEF (heart failure with reduced ejection fraction) ([place])    a. 07/2016 Echo: EF 55-60%; b. 02/2018 MV: EF 30%; c. 04/2018 Echo: EF 35%; d. 08/2018 Echo: EF 30-35%. Glob HK w/o rwma. Mildly reduced RV fxn. Mod to sev dil LA. Nl AoV fxn.  Marland Kitchen Hx of colonoscopy    2009 by Dr. Laural Golden. Normal except for small submucosal lipoma. No evidence of polyps or colitis.  . Hyperlipidemia   . Hypertension   . Hypogonadism in male 07/25/2015  . Ischemic cardiomyopathy    a. 07/2016 Echo: EF 55-60%; b. 02/2018 MV: EF 30%; c. 04/2018 Echo: EF 35%; d. 08/2018 Echo: EF 30-35%.  . OSA (obstructive sleep apnea)    No CPAP  . Permanent atrial fibrillation (H[facility])    a. CHA2DS2VASc = 3-->Coumadin (also mech AVR); b. 06/2018 Amio started 2/2 elevated rates.  . Renal cell cancer ([place])   . Renal cell carcinoma ([place]) 08/04/2016     Past Surgical History:  Procedure Laterality Date  . ABDOMINAL AORTOGRAM W/LOWER EXTREMITY N/A 12/22/2019   Procedure: ABDOMINAL AORTOGRAM W/LOWER EXTREMITY;  Surgeon: Marty Heck, MD;  Location: [place] [facility];  Service: Cardiovascular;  Laterality: N/A;  . AORTIC VALVE REPLACEMENT  3 /11 /2014   At [place]    . CAPD INSERTION N/A 02/19/2017   Procedure: LAPAROSCOPIC INSERTION CONTINUOUS AMBULATORY PERITONEAL DIALYSIS  (CAPD) CATHETER;  Surgeon: Algernon Huxley, MD;  Location: [facility];  Service: General;  Laterality: N/A;  . CORONARY ANGIOPLASTY WITH STENT PLACEMENT    . CORONARY ARTERY BYPASS GRAFT N/A 09/16/2018   Procedure: CORONARY ARTERY BYPASS GRAFT times three      with left internal mammary artery and endoharvest of right leg greater saphenous vein;  Surgeon: Melrose Nakayama, MD;  Location: [place];  Service: Open Heart Surgery;  Laterality: N/A;  . CORONARY STENT INTERVENTION N/A 07/15/2019   Procedure: CORONARY STENT INTERVENTION;  Surgeon: Wellington Hampshire, MD;  Location: [place] [facility];  Service: Cardiovascular;  Laterality: N/A;  . DG AV DIALYSIS  SHUNT ACCESS EXIST*L* OR     left arm  . EPIGASTRIC HERNIA REPAIR N/A 02/19/2017   Procedure: HERNIA REPAIR EPIGASTRIC ADULT;  Surgeon: Robert Bellow, MD;  Location: [facility];  Service: General;  Laterality: N/A;  . EYE SURGERY     bilateral cataract  . HERNIA REPAIR     UMBILICAL HERNIA  .  HERNIA REPAIR  02/19/2017  . INNER EAR SURGERY     20 years ago  . KIDNEY TRANSPLANT    . LEFT HEART CATH AND CORS/GRAFTS ANGIOGRAPHY N/A 07/15/2019   Procedure: LEFT HEART CATH AND CORS/GRAFTS ANGIOGRAPHY;  Surgeon: Wellington Hampshire, MD;  Location: [place] [facility];  Service: Cardiovascular;  Laterality: N/A;  . LOWER EXTREMITY ANGIOGRAPHY Right 08/25/2019   Procedure: LOWER EXTREMITY ANGIOGRAPHY;  Surgeon: Algernon Huxley, MD;  Location: [place] [facility];  Service: Cardiovascular;  Laterality: Right;  . LOWER EXTREMITY ANGIOGRAPHY Left 09/29/2019   Procedure: Lower Extremity Angiography;  Surgeon: Algernon Huxley, MD;  Location: [place] [facility];  Service: Cardiovascular;  Laterality:  Left;  . PERIPHERAL VASCULAR INTERVENTION Right 12/22/2019   Procedure: PERIPHERAL VASCULAR INTERVENTION;  Surgeon: Marty Heck, MD;  Location: [place] [facility];  Service: Cardiovascular;  Laterality: Right;  . Peritoneal Dialysis access placement  02/19/2017  . PSEUDOANERYSM COMPRESSION N/A 09/29/2019   Procedure: PSEUDOANERYSM COMPRESSION;  Surgeon: Algernon Huxley, MD;  Location: [place] [facility];  Service: Cardiovascular;  Laterality: N/A;  . REMOVAL OF A DIALYSIS CATHETER N/A 11/23/2017   Procedure: REMOVAL OF A PERITONEAL DIALYSIS CATHETER;  Surgeon: Algernon Huxley, MD;  Location: [facility];  Service: Vascular;  Laterality: N/A;  . RIGHT/LEFT HEART CATH AND CORONARY ANGIOGRAPHY Bilateral 06/07/2018   Procedure: RIGHT/LEFT HEART CATH AND CORONARY ANGIOGRAPHY;  Surgeon: Wellington Hampshire, MD;  Location: [place] [facility];  Service: Cardiovascular;  Laterality: Bilateral;  . TEE WITHOUT CARDIOVERSION N/A 07/21/2016   Procedure: TRANSESOPHAGEAL ECHOCARDIOGRAM (TEE);  Surgeon: Wellington Hampshire, MD;  Location: [facility];  Service: Cardiovascular;  Laterality: N/A;  . TEE WITHOUT CARDIOVERSION N/A 09/16/2018   Procedure: TRANSESOPHAGEAL ECHOCARDIOGRAM (TEE);  Surgeon: Melrose Nakayama, MD;  Location: [place];  Service: Open Heart Surgery;  Laterality: N/A;    Social History   Socioeconomic History  . Marital status: Divorced    Spouse name: Not on file  . Number of children: 1  . Years of education: Not on file  . Highest education level: High school graduate  Occupational History  . Occupation: retired Forensic scientist     Comment: Psychologist, educational  Tobacco Use  . Smoking status: Never Smoker  . Smokeless tobacco: Never Used  Vaping Use  . Vaping Use: Never used  Substance and Sexual Activity  . Alcohol use: No  . Drug use: No  . Sexual activity: Not Currently    Partners: Female  Other Topics Concern  . Not on file  Social History Narrative   Lives at home with girlfriend   1  daughter 59    Social Determinants of Health   Financial Resource Strain:   . Difficulty of Paying Living Expenses: Not on file  Food Insecurity:   . Worried About Charity fundraiser in the Last Year: Not on file  . Ran Out of Food in the Last Year: Not on file  Transportation Needs:   . Lack of Transportation (Medical): Not on file  . Lack of Transportation (Non-Medical): Not on file  Physical Activity:   . Days of Exercise per Week: Not on file  . Minutes of Exercise per Session: Not on file  Stress:   . Feeling of Stress : Not on file  Social Connections:   . Frequency of Communication with Friends and Family: Not on file  . Frequency of Social Gatherings with Friends and Family: Not on file  .  Attends Religious Services: Not on file  . Active Member of Clubs or Organizations: Not on file  . Attends Archivist Meetings: Not on file  . Marital Status: Not on file  Intimate Partner Violence:   . Fear of Current or Ex-Partner: Not on file  . Emotionally Abused: Not on file  . Physically Abused: Not on file  . Sexually Abused: Not on file    Family History  Problem Relation Age of Onset  . Cancer Mother        pancreatic  . Heart disease Father   . Diabetes Father      Current Outpatient Medications:  .  acetaminophen (TYLENOL) 500 MG tablet, Take 1,000 mg 2 (two) times daily as needed by mouth for moderate pain., Disp: , Rfl:  .  allopurinol (ZYLOPRIM) 300 MG tablet, Take 300 mg by mouth daily., Disp: , Rfl: 5 .  azelastine (ASTELIN) 0.1 % nasal spray, PLACE 1 SPRAY INTO BOTH NOSTRILS 2 (TWO) TIMES DAILY. USE IN EACH NOSTRIL AS DIRECTED, Disp: 30 mL, Rfl: 3 .  cholecalciferol (VITAMIN D3) 25 MCG (1000 UNIT) tablet, Take 1,000 Units by mouth daily., Disp: , Rfl:  .  cinacalcet (SENSIPAR) 30 MG tablet, Take 30 mg by mouth daily. , Disp: , Rfl:  .  clopidogrel (PLAVIX) 75 MG tablet, Take 1 tablet (75 mg total) by mouth daily with breakfast., Disp: 90 tablet, Rfl:  3 .  Epoetin Alfa-epbx (RETACRIT IJ), Epoetin alfa - epbx (Retacrit), Disp: , Rfl:  .  gabapentin (NEURONTIN) 100 MG capsule, Take by mouth., Disp: , Rfl:  .  hydroxychloroquine (PLAQUENIL) 200 MG tablet, Take 200 mg by mouth 2 (two) times daily., Disp: , Rfl:  .  iron sucrose in sodium chloride 0.9 % 100 mL, Iron Sucrose (Venofer), Disp: , Rfl:  .  lidocaine-prilocaine (EMLA) cream, Apply 1 application topically as needed (for fistula). Monday, Wednesday, Friday for dialysis, Disp: , Rfl:  .  LOPERAMIDE HCL PO, Take by mouth., Disp: , Rfl:  .  loratadine (CLARITIN) 10 MG tablet, Take 10 mg by mouth daily., Disp: , Rfl:  .  metoprolol tartrate (LOPRESSOR) 100 MG tablet, Take 1 tablet (100 mg total) by mouth 2 (two) times daily., Disp: 180 tablet, Rfl: 0 .  Metoprolol Tartrate 75 MG TABS, Take 1 tablet by mouth 2 (two) times daily., Disp: , Rfl:  .  midodrine (PROAMATINE) 10 MG tablet, Take 10 mg by mouth 3 (three) times a week. Mon, Wed, Friday, Disp: , Rfl:  .  multivitamin (RENA-VIT) TABS tablet, Take 1 tablet by mouth daily., Disp: , Rfl:  .  oxyCODONE (OXY IR/ROXICODONE) 5 MG immediate release tablet, Take 1 tablet (5 mg total) by mouth every 8 (eight) hours as needed for breakthrough pain., Disp: 30 tablet, Rfl: 0 .  pantoprazole (PROTONIX) 40 MG tablet, Take 1 tablet (40 mg total) by mouth daily., Disp: 90 tablet, Rfl: 3 .  predniSONE (DELTASONE) 1 MG tablet, Take 4 mg by mouth daily with breakfast., Disp: , Rfl:  .  predniSONE (DELTASONE) 5 MG tablet, Take 5 mg by mouth daily., Disp: , Rfl:  .  rosuvastatin (CRESTOR) 40 MG tablet, Take 1 tablet (40 mg total) by mouth daily at 6 PM., Disp: 90 tablet, Rfl: 3 .  sevelamer carbonate (RENVELA) 800 MG tablet, Take 3,200 mg by mouth 3 (three) times daily with meals. 4 tabs per meal, Disp: , Rfl:  .  silver sulfADIAZINE (SILVADENE) 1 % cream, Apply 1 application topically  daily. Apply to right great toe and heel, Disp: , Rfl:  .  traZODone  (DESYREL) 50 MG tablet, TAKE 1/2 -1 TABLET (25-50 MG TOTAL) BY MOUTH AT BEDTIME AS NEEDED FOR SLEEP. (Patient taking differently: Take 50 mg by mouth at bedtime as needed for sleep. ), Disp: 90 tablet, Rfl: 2 .  warfarin (COUMADIN) 1 MG tablet, Take 1 mg by mouth daily., Disp: , Rfl:  .  zinc sulfate 220 (50 Zn) MG capsule, Take 220 mg by mouth daily., Disp: , Rfl:  .  benzonatate (TESSALON) 200 MG capsule, Take 1 capsule (200 mg total) by mouth 3 (three) times daily as needed for cough. (Patient not taking: Reported on 01/31/2020), Disp: 30 capsule, Rfl: 1 .  ezetimibe (ZETIA) 10 MG tablet, Take 1 tablet (10 mg total) by mouth daily., Disp: 90 tablet, Rfl: 3 .  nitroGLYCERIN (NITROSTAT) 0.4 MG SL tablet, Place 1 tablet (0.4 mg total) under the tongue every 5 (five) minutes as needed for chest pain., Disp: 25 tablet, Rfl: 3  Current Facility-Administered Medications:  .  0.9 %  sodium chloride infusion, 250 mL, Intravenous, PRN, Marty Heck, MD  Physical exam:  Vitals:   03/08/20 1209  BP: (!) 84/63  Pulse: (!) 118  Resp: 18  Temp: 99 F (37.2 C)  TempSrc: Tympanic  SpO2: 100%  Weight: 170 lb (77.1 kg)   Physical Exam Constitutional:      General: He is not in acute distress. Cardiovascular:     Rate and Rhythm: Normal rate and regular rhythm.     Heart sounds: Normal heart sounds.  Pulmonary:     Effort: Pulmonary effort is normal.     Breath sounds: Normal breath sounds.  Abdominal:     General: Bowel sounds are normal.     Palpations: Abdomen is soft.  Skin:    General: Skin is warm and dry.  Neurological:     Mental Status: He is alert and oriented to person, place, and time.      CMP Latest Ref Rng & Units 12/23/2019  Glucose 70 - 99 mg/dL 79  BUN 8 - 23 mg/dL 37(H)  Creatinine 0.61 - 1.24 mg/dL 7.45(H)  Sodium 135 - 145 mmol/L 136  Potassium 3.5 - 5.1 mmol/L 5.2(H)  Chloride 98 - 111 mmol/L 96(L)  CO2 22 - 32 mmol/L 27  Calcium 8.9 - 10.3 mg/dL 8.8(L)    Total Protein 6.5 - 8.1 g/dL -  Total Bilirubin 0.3 - 1.2 mg/dL -  Alkaline Phos 38 - 126 U/L -  AST 15 - 41 U/L -  ALT 0 - 44 U/L -   CBC Latest Ref Rng & Units 12/24/2019  WBC 4.0 - 10.5 K/uL 5.0  Hemoglobin 13.0 - 17.0 g/dL 10.7(L)  Hematocrit 39 - 52 % 36.2(L)  Platelets 150 - 400 K/uL 69(L)    No images are attached to the encounter.  CT Abdomen Pelvis Wo Contrast  Result Date: 03/06/2020 CLINICAL DATA:  End-stage renal disease. Status post renal transplant. History of renal cell carcinoma. Asymptomatic. EXAM: CT ABDOMEN AND PELVIS WITHOUT CONTRAST TECHNIQUE: Multidetector CT imaging of the abdomen and pelvis was performed following the standard protocol without IV contrast. COMPARISON:  10/01/2019 FINDINGS: Lower chest: Clear lung bases. Moderate cardiomegaly, without pericardial or pleural effusion. Tiny hiatal hernia. Hepatobiliary: Normal noncontrast appearance of the liver. Dependent tiny gallstones. No acute cholecystitis or biliary duct dilatation. Pancreas: Normal, without mass or ductal dilatation. Spleen: Borderline splenomegaly, 12.5 cm craniocaudal. Adrenals/Urinary Tract:  Normal adrenal glands. Status post bilateral renal transplant. Calcified right iliac fossa renal transplant with subtending embolization coils. Decompressed urinary bladder. Stomach/Bowel: Normal remainder of the stomach. Scattered colonic diverticula. Normal terminal ileum and appendix. Normal small bowel. Vascular/Lymphatic: Dense aortic and branch vessel atherosclerosis. No abdominopelvic adenopathy. Reproductive: Normal prostate. Other: No significant free fluid. Prior ventral abdominal wall hernia repair. Soft tissue thickening in the proximal left thigh is likely the residual of hematoma back on 10/01/2019. Relatively mild, including on 89/2. Musculoskeletal: Renal osteodystrophy. Degenerate disc disease at L5-S1 with disc bulges at this level and L4-5. IMPRESSION: 1. Status post bilateral nephrectomy and  right lower quadrant renal transplant, without recurrent or metastatic disease. 2. Cholelithiasis. 3. Tiny hiatal hernia. 4. Aortic Atherosclerosis (ICD10-I70.0). Electronically Signed   By: Abigail Miyamoto M.D.   On: 03/06/2020 17:00     Assessment and plan- Patient is a 64 y.o. male with history of renal cell carcinoma in 2008 and is here for routine follow-up.  CT abdomen and pelvis without contrast in November 2021 did not show any evidence of recurrence.  He also had a CT chest back in September which did not show any evidence of metastatic disease.  It has been 13 years since his recurrent kidney cancer diagnosis in 2008 and he therefore does not require any surveillance imaging at this time.  No need for oncology follow-up at this time and can be referred to [name] in the future if questions or concerns arise  Visit Diagnosis 1. Encounter for follow-up surveillance of kidney cancer      Dr. Randa Evens, MD, MPH [facility] at [facility] [phone] 03/13/2020 10:49 AM

## 2020-03-13 NOTE — Patient Instructions (Signed)
Medication Instructions:  Your physician has recommended you make the following change in your medication:   REDUCE Metoprolol to 75 mg twice daily. An Rx has been sent to your pharmacy.  *If you need a refill on your cardiac medications before your next appointment, please call your pharmacy*   Lab Work: None ordered If you have labs (blood work) drawn today and your tests are completely normal, you will receive your results only by: Marland Kitchen MyChart Message (if you have MyChart) OR . A paper copy in the mail If you have any lab test that is abnormal or we need to change your treatment, we will call you to review the results.   Testing/Procedures: None ordered   Follow-Up: At [facility], you and your health needs are our priority.  As part of our continuing mission to provide you with exceptional heart care, we have created designated Provider Care Teams.  These Care Teams include your primary Cardiologist (physician) and Advanced Practice Providers (APPs -  Physician Assistants and Nurse Practitioners) who all work together to provide you with the care you need, when you need it.  We recommend signing up for the patient portal called "MyChart".  Sign up information is provided on this After Visit Summary.  MyChart is used to connect with patients for Virtual Visits (Telemedicine).  Patients are able to view lab/test results, encounter notes, upcoming appointments, etc.  Non-urgent messages can be sent to your provider as well.   To learn more about what you can do with MyChart, go to [email or web address].    Your next appointment:   6 month(s)  The format for your next appointment:   In Person  Provider:   You may see Kathlyn Sacramento, MD or one of the following Advanced Practice Providers on your designated Care Team:    Murray Hodgkins, NP  Christell Faith, PA-C  Marrianne Mood, PA-C  Cadence [place], [place]  Laurann [place], NP    Other Instructions N/A

## 2020-03-13 NOTE — Progress Notes (Signed)
Cardiology Office Note   Date:  03/13/2020   ID:  Jeremy [name], DOB [birth year], MRN [id number]  PCP:  Leone Haven, MD  Cardiologist:   Kathlyn Sacramento, MD   Chief Complaint  Patient presents with  . OTHER    6 month f/u c/o right leg pain due to calcification. Meds reviewed verbally with pt.      History of Present Illness: Jeremy [name] is a 64 y.o. male who is here today for follow-up visit regarding coronary artery disease.   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  He had an echocardiogram done in January 2020 which showed a drop in ejection fraction from normal to 35%, moderately reduced RV function, moderate biatrial enlargement, [employer] Jude mechanical aortic valve with mean gradient of 21 mmHg, mild mitral regurgitation and mild pulmonary hypertension.  He was on peritoneal dialysis before and was switched to hemodialysis in August 2019 due to ineffective fluid removal.  The patient had worsening angina in early 2020 with abnormal nuclear stress test.  He underwent cardiac catheterization which showed severe heavily calcified two-vessel coronary artery disease affecting the LAD and right coronary artery.  He underwent CABG by Dr. Roxan Hockey with LIMA to LAD, SVG to diagonal and SVG to right PDA. He had improvement in symptoms but then started having issues with A. fib with RVR.  He was on amiodarone for rate control at some point but then was discontinued given chronicity and improvement in ventricular rate with metoprolol.  Subsequently, he was switched to  carvedilol but ventricular rate worsened.  He had recent recurrent angina.  Nuclear stress test in April showed large fixed defect in the inferior and inferolateral wall consistent with an infarct as well as inferior ischemia and ejection fraction of 27%.  Due to these findings, I proceeded with cardiac catheterization in April which showed significant underlying two-vessel coronary artery disease with patent LIMA to LAD and occluded SVG to diagonal.  Diagonal was getting collaterals from the LAD.  SVG to right PDA was patent but had 99% ostial stenosis.  I performed successful angioplasty and drug-eluting stent placement to SVG to right PDA.  He had small hematoma after the procedure that did not require intervention.  Carvedilol was switched to metoprolol for better rate control. He was discharged home on Plavix without aspirin given that he is on anticoagulation with warfarin.    The patient has extensive history of peripheral arterial disease previously followed by Dr. Lucky Cowboy and currently followed by the [facility].  Previous procedure was complicated by large left groin pseudoaneurysm with multiple covered stents placed in common femoral artery, SFA and profunda.  The patient has been doing reasonably well from a cardiac standpoint with no chest pain or worsening dyspnea.  Ventricular rate is still not optimally controlled with intermittent palpitations.  Most of the time, he has been taking metoprolol daily instead of twice a day.   Past Medical History:  Diagnosis Date  . Abnormal stress test 03/19/2018  . 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 Aortic stenosis    a. 2014 s/p mech AVR ([facility])-->chronic coumadin;  b. 08/2018 Echo: Mild AS/AI. Nl fxn/gradient.  . Arthritis   . Atrial fibrillation ([place])   . CAD (coronary artery disease)    a. 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 02/2018 MV [facility]): large inf, infsept, inflat infarct w/ min  rev, EF 30%; c. 06/2018 Cath: LM min irregs, LAD 60p, 95/30/71m, LCX min irregs, RCA 40p, 69m, 90d, RPAV 90, RPDA 95ost; d. 09/2018 CABGx3 (LIMA->LAD, VG->D1, VG->PDA).  . Chronic anticoagulation 06/2012   a. Coumadin in the setting of mech AVR and afib.  Marland Kitchen COVID-19 03/15/2019  . CPAP (continuous positive airway pressure) dependence   . Dialysis patient [facility]) 04/07/2006   Patient started HD around 1996,  had 1st transplant LLQ around 1998 until 2007 when they found renal cell cancer in transplant and both native kidneys, all were removed > went back on HD until 2nd transplant RLQ in 2014 which lasted 3 yrs; it was embolized for suspected acute rejection. Is currently on HD MWF in [place] w/ [facility] nephrology.  . ESRD (end stage renal disease) ([place])    a. 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 1999 s/p Kidney transplant; c. 2014 Bilat nephrectomy (transplanted kidney resected) in the setting of renal cell carcinoma; d. PD until 11/2017-->HD.  Marland Kitchen Heart murmur   . HFrEF (heart failure with reduced ejection fraction) ([place])    a. 07/2016 Echo: EF 55-60%; b. 02/2018 MV: EF 30%; c. 04/2018 Echo: EF 35%; d. 08/2018 Echo: EF 30-35%. Glob HK w/o rwma. Mildly reduced RV fxn. Mod to sev dil LA. Nl AoV fxn.  Marland Kitchen Hx of colonoscopy    2009 by Dr. Laural Golden. Normal except for small submucosal lipoma. No evidence of polyps or colitis.  . Hyperlipidemia   . Hypertension   . Hypogonadism in male 07/25/2015  . Ischemic cardiomyopathy    a. 07/2016 Echo: EF 55-60%; b. 02/2018 MV: EF 30%; c. 04/2018 Echo: EF 35%; d. 08/2018 Echo: EF 30-35%.  . OSA (obstructive sleep apnea)    No CPAP  . Permanent atrial fibrillation ([facility])    a. CHA2DS2VASc = 3-->Coumadin (also mech AVR); b. 06/2018 Amio started 2/2 elevated rates.  . Renal cell cancer ([place])   . Renal cell carcinoma ([place]) 08/04/2016    Past Surgical History:  Procedure Laterality Date  . ABDOMINAL AORTOGRAM W/LOWER EXTREMITY N/A 12/22/2019   Procedure: ABDOMINAL AORTOGRAM  W/LOWER EXTREMITY;  Surgeon: Marty Heck, MD;  Location: [place] [facility];  Service: Cardiovascular;  Laterality: N/A;  . AORTIC VALVE REPLACEMENT  3 /11 /2014   At [facility]    . CAPD INSERTION N/A 02/19/2017   Procedure: LAPAROSCOPIC INSERTION CONTINUOUS AMBULATORY PERITONEAL DIALYSIS  (CAPD) CATHETER;  Surgeon: Algernon Huxley, MD;  Location: [facility];  Service: General;  Laterality: N/A;  . CORONARY ANGIOPLASTY WITH STENT PLACEMENT    . CORONARY ARTERY BYPASS GRAFT N/A 09/16/2018   Procedure: CORONARY ARTERY BYPASS GRAFT times three      with left internal mammary artery and endoharvest of right leg greater saphenous vein;  Surgeon: Melrose Nakayama, MD;  Location: [place];  Service: Open Heart Surgery;  Laterality: N/A;  . CORONARY STENT INTERVENTION N/A 07/15/2019   Procedure: CORONARY STENT INTERVENTION;  Surgeon: Wellington Hampshire, MD;  Location: [place] [facility];  Service: Cardiovascular;  Laterality: N/A;  . DG AV DIALYSIS  SHUNT ACCESS EXIST*L*  OR     left arm  . EPIGASTRIC HERNIA REPAIR N/A 02/19/2017   Procedure: HERNIA REPAIR EPIGASTRIC ADULT;  Surgeon: Robert Bellow, MD;  Location: [facility];  Service: General;  Laterality: N/A;  . EYE SURGERY     bilateral cataract  . HERNIA REPAIR     UMBILICAL HERNIA  . HERNIA REPAIR  02/19/2017  . INNER EAR SURGERY     20 years ago  . KIDNEY TRANSPLANT    . LEFT HEART CATH AND CORS/GRAFTS ANGIOGRAPHY N/A 07/15/2019   Procedure: LEFT HEART CATH AND CORS/GRAFTS ANGIOGRAPHY;  Surgeon: Wellington Hampshire, MD;  Location: [place] [facility];  Service: Cardiovascular;  Laterality: N/A;  . LOWER EXTREMITY ANGIOGRAPHY Right 08/25/2019   Procedure: LOWER EXTREMITY ANGIOGRAPHY;  Surgeon: Algernon Huxley, MD;  Location: [place] [facility];  Service: Cardiovascular;  Laterality: Right;  . LOWER EXTREMITY ANGIOGRAPHY Left 09/29/2019   Procedure: Lower Extremity Angiography;  Surgeon: Algernon Huxley,  MD;  Location: [place] [facility];  Service: Cardiovascular;  Laterality: Left;  . PERIPHERAL VASCULAR INTERVENTION Right 12/22/2019   Procedure: PERIPHERAL VASCULAR INTERVENTION;  Surgeon: Marty Heck, MD;  Location: [place] [facility];  Service: Cardiovascular;  Laterality: Right;  . Peritoneal Dialysis access placement  02/19/2017  . PSEUDOANERYSM COMPRESSION N/A 09/29/2019   Procedure: PSEUDOANERYSM COMPRESSION;  Surgeon: Algernon Huxley, MD;  Location: [place] [facility];  Service: Cardiovascular;  Laterality: N/A;  . REMOVAL OF A DIALYSIS CATHETER N/A 11/23/2017   Procedure: REMOVAL OF A PERITONEAL DIALYSIS CATHETER;  Surgeon: Algernon Huxley, MD;  Location: [facility];  Service: Vascular;  Laterality: N/A;  . RIGHT/LEFT HEART CATH AND CORONARY ANGIOGRAPHY Bilateral 06/07/2018   Procedure: RIGHT/LEFT HEART CATH AND CORONARY ANGIOGRAPHY;  Surgeon: Wellington Hampshire, MD;  Location: [place] [facility];  Service: Cardiovascular;  Laterality: Bilateral;  . TEE WITHOUT CARDIOVERSION N/A 07/21/2016   Procedure: TRANSESOPHAGEAL ECHOCARDIOGRAM (TEE);  Surgeon: Wellington Hampshire, MD;  Location: [facility];  Service: Cardiovascular;  Laterality: N/A;  . TEE WITHOUT CARDIOVERSION N/A 09/16/2018   Procedure: TRANSESOPHAGEAL ECHOCARDIOGRAM (TEE);  Surgeon: Melrose Nakayama, MD;  Location: [place];  Service: Open Heart Surgery;  Laterality: N/A;     Current Outpatient Medications  Medication Sig Dispense Refill  . acetaminophen (TYLENOL) 500 MG tablet Take 1,000 mg 2 (two) times daily as needed by mouth for moderate pain.    Marland Kitchen allopurinol (ZYLOPRIM) 300 MG tablet Take 300 mg by mouth daily.  5  . azelastine (ASTELIN) 0.1 % nasal spray PLACE 1 SPRAY INTO BOTH NOSTRILS 2 (TWO) TIMES DAILY. USE IN EACH NOSTRIL AS DIRECTED 30 mL 3  . benzonatate (TESSALON) 200 MG capsule Take 1 capsule (200 mg total) by mouth 3 (three) times daily as needed for cough. 30 capsule 1  . cholecalciferol (VITAMIN D3) 25 MCG  (1000 UNIT) tablet Take 1,000 Units by mouth daily.    . cinacalcet (SENSIPAR) 30 MG tablet Take 30 mg by mouth daily.     . clopidogrel (PLAVIX) 75 MG tablet Take 1 tablet (75 mg total) by mouth daily with breakfast. 90 tablet 3  . Epoetin Alfa-epbx (RETACRIT IJ) Epoetin alfa - epbx (Retacrit)    . ezetimibe (ZETIA) 10 MG tablet Take 1 tablet (10 mg total) by mouth daily. 90 tablet 3  . gabapentin (NEURONTIN) 100 MG capsule Take by mouth.    . hydroxychloroquine (PLAQUENIL) 200 MG tablet Take 200 mg by mouth 2 (two) times daily.    Marland Kitchen  iron sucrose in sodium chloride 0.9 % 100 mL Iron Sucrose (Venofer)    . lidocaine-prilocaine (EMLA) cream Apply 1 application topically as needed (for fistula). Monday, Wednesday, Friday for dialysis    . LOPERAMIDE HCL PO Take by mouth.    . loratadine (CLARITIN) 10 MG tablet Take 10 mg by mouth daily.    . metoprolol tartrate (LOPRESSOR) 100 MG tablet Take 1 tablet (100 mg total) by mouth 2 (two) times daily. 180 tablet 0  . midodrine (PROAMATINE) 10 MG tablet Take 10 mg by mouth 3 (three) times a week. Mon, Wed, Friday    . multivitamin (RENA-VIT) TABS tablet Take 1 tablet by mouth daily.    . nitroGLYCERIN (NITROSTAT) 0.4 MG SL tablet Place 1 tablet (0.4 mg total) under the tongue every 5 (five) minutes as needed for chest pain. 25 tablet 3  . oxyCODONE (OXY IR/ROXICODONE) 5 MG immediate release tablet Take 1 tablet (5 mg total) by mouth every 8 (eight) hours as needed for breakthrough pain. 30 tablet 0  . pantoprazole (PROTONIX) 40 MG tablet Take 1 tablet (40 mg total) by mouth daily. 90 tablet 3  . predniSONE (DELTASONE) 5 MG tablet Take 5 mg by mouth daily.    . rosuvastatin (CRESTOR) 40 MG tablet Take 1 tablet (40 mg total) by mouth daily at 6 PM. 90 tablet 3  . sevelamer carbonate (RENVELA) 800 MG tablet Take 3,200 mg by mouth 3 (three) times daily with meals. 4 tabs per meal    . silver sulfADIAZINE (SILVADENE) 1 % cream Apply 1 application topically  daily. Apply to right great toe and heel    . traZODone (DESYREL) 50 MG tablet TAKE 1/2 -1 TABLET (25-50 MG TOTAL) BY MOUTH AT BEDTIME AS NEEDED FOR SLEEP. (Patient taking differently: Take 50 mg by mouth at bedtime as needed for sleep. ) 90 tablet 2  . warfarin (COUMADIN) 1 MG tablet Take 1 mg by mouth daily.    Marland Kitchen zinc sulfate 220 (50 Zn) MG capsule Take 220 mg by mouth daily.     Current Facility-Administered Medications  Medication Dose Route Frequency Provider Last Rate Last Admin  . 0.9 %  sodium chloride infusion  250 mL Intravenous PRN Marty Heck, MD        Allergies:   Statins and Sertraline    Social History:  The patient  reports that he has never smoked. He has never used smokeless tobacco. He reports that he does not drink alcohol and does not use drugs.   Family History:  The patient's family history includes Cancer in his mother; Diabetes in his father; Heart disease in his father.    ROS:  Please see the history of present illness.   Otherwise, review of systems are positive for none.   All other systems are reviewed and negative.    PHYSICAL EXAM: VS:  BP 138/70 (BP Location: Left Arm, Patient Position: Sitting, Cuff Size: Normal)   Pulse 92   Ht 5\' 10"  (1.778 m)   Wt 176 lb (79.8 kg)   SpO2 99%   BMI 25.25 kg/m  , BMI Body mass index is 25.25 kg/m. GEN: Well nourished, well developed, in no acute distress  HEENT: normal  Neck: Mild JVD, no carotid bruits, or masses Cardiac: Irregularly irregular; normal mechanical heart sounds with 1 out of 6 systolic murmur at the aortic area . Respiratory:  clear to auscultation bilaterally, normal work of breathing GI: soft, nontender, nondistended, + BS MS: no deformity  or atrophy  Skin: warm and dry, no rash Neuro:  Strength and sensation are intact Psych: euthymic mood, full affect Vascular: The patient has a fistula in the left arm.     EKG:  EKG is ordered today. The ekg ordered today demonstrates atrial  fibrillation with ventricular rate of 92 bpm.  Right bundle branch block and nonspecific T wave changes.  Recent Labs: 03/15/2019: B Natriuretic Peptide 1,821.0 10/07/2019: Magnesium 1.9 12/21/2019: ALT 25 12/23/2019: BUN 37; Creatinine, Ser 7.45; Potassium 5.2; Sodium 136 12/24/2019: Hemoglobin 10.7; Platelets 69    Lipid Panel    Component Value Date/Time   CHOL 136 06/21/2019 0932   CHOL 179 04/26/2019 0938   TRIG 150 (H) 06/21/2019 0932   HDL 33 (L) 06/21/2019 0932   HDL 38 (L) 04/26/2019 0938   CHOLHDL 4.1 06/21/2019 0932   VLDL 30 06/21/2019 0932   LDLCALC 73 06/21/2019 0932   LDLCALC 111 (H) 04/26/2019 0938   LDLDIRECT 61.0 08/11/2017 1447      Wt Readings from Last 3 Encounters:  03/13/20 176 lb (79.8 kg)  03/08/20 170 lb (77.1 kg)  03/06/20 171 lb (77.6 kg)       No flowsheet data found.    ASSESSMENT AND PLAN:  1.  Coronary artery disease involving native coronary arteries without angina:  Continue clopidogrel without aspirin given that he is on warfarin.   2.  Chronic systolic heart failure: Most likely due to ischemic cardiomyopathy.  Most recent echo in February showed an EF of 40 to 45%.    He is not on an ACE inhibitor, ARB or other vasodilators due to low blood pressure.   3.  Chronic atrial fibrillation: On long-term anticoagulation with warfarin given mechanical aortic valve.  He has been mostly taking metoprolol tartrate once daily due to drop in blood pressure in the afternoon.  I explained to him that it is best to take twice daily and thus I adjusted the dose of metoprolol to 75 mg twice a day.  4.  Hyperlipidemia: He is tolerating high-dose rosuvastatin and Zetia.  Most recent lipid profile showed an LDL of 73.  5.  Mechanical aortic valve replacement: Continue long-term anticoagulation with warfarin.  6.  End-stage renal disease on hemodialysis Monday Wednesday and Friday.  7.  Peripheral arterial disease: Currently followed by [facility].   Disposition:   FU with me in 6 months  Signed,  Kathlyn Sacramento, MD  03/13/2020 2:10 PM    [place]

## 2020-03-14 ENCOUNTER — Ambulatory Visit (INDEPENDENT_AMBULATORY_CARE_PROVIDER_SITE_OTHER): Payer: Medicare Other

## 2020-03-14 ENCOUNTER — Encounter: Payer: Self-pay | Admitting: Cardiovascular Disease

## 2020-03-14 DIAGNOSIS — Z952 Presence of prosthetic heart valve: Secondary | ICD-10-CM

## 2020-03-14 DIAGNOSIS — I4811 Longstanding persistent atrial fibrillation: Secondary | ICD-10-CM

## 2020-03-14 DIAGNOSIS — Z5181 Encounter for therapeutic drug level monitoring: Secondary | ICD-10-CM

## 2020-03-14 LAB — POCT INR: INR: 3.5 — AB (ref 2.0–3.0)

## 2020-03-14 NOTE — Patient Instructions (Signed)
Sent MyChart message to pt and advised him to :  -have a serving of greens today - continue to take 1 tablet of warfarin daily.  - Recheck INR in 1 week

## 2020-03-18 ENCOUNTER — Other Ambulatory Visit: Payer: Self-pay | Admitting: Physician Assistant

## 2020-03-18 ENCOUNTER — Other Ambulatory Visit: Payer: Self-pay | Admitting: Family Medicine

## 2020-03-20 ENCOUNTER — Encounter: Payer: Self-pay | Admitting: Cardiovascular Disease

## 2020-03-20 ENCOUNTER — Ambulatory Visit (INDEPENDENT_AMBULATORY_CARE_PROVIDER_SITE_OTHER): Payer: Medicare Other | Admitting: Neurology

## 2020-03-20 ENCOUNTER — Other Ambulatory Visit: Payer: Self-pay

## 2020-03-20 ENCOUNTER — Telehealth: Payer: Self-pay | Admitting: Cardiovascular Disease

## 2020-03-20 ENCOUNTER — Encounter: Payer: Self-pay | Admitting: Neurology

## 2020-03-20 ENCOUNTER — Other Ambulatory Visit: Payer: Self-pay | Admitting: Neurology

## 2020-03-20 VITALS — BP 125/73 | HR 91 | Ht 70.0 in | Wt 171.0 lb

## 2020-03-20 DIAGNOSIS — I255 Ischemic cardiomyopathy: Secondary | ICD-10-CM | POA: Diagnosis not present

## 2020-03-20 DIAGNOSIS — G5722 Lesion of femoral nerve, left lower limb: Secondary | ICD-10-CM | POA: Insufficient documentation

## 2020-03-20 DIAGNOSIS — I248 Other forms of acute ischemic heart disease: Secondary | ICD-10-CM | POA: Diagnosis not present

## 2020-03-20 DIAGNOSIS — I739 Peripheral vascular disease, unspecified: Secondary | ICD-10-CM | POA: Diagnosis not present

## 2020-03-20 DIAGNOSIS — G5791 Unspecified mononeuropathy of right lower limb: Secondary | ICD-10-CM | POA: Diagnosis not present

## 2020-03-20 LAB — POCT INR: INR: 3.1 — AB (ref 2.0–3.0)

## 2020-03-20 MED ORDER — LAMOTRIGINE 25 MG PO TABS: 50.0000 mg | ORAL_TABLET | Freq: Two times a day (BID) | ORAL | 5 refills | Status: DC

## 2020-03-20 NOTE — Progress Notes (Signed)
[facility]  PATIENT: Jeremy [name] DOB: January 07, 1956  REFERRING DOCTOR OR PCP: Marella Chimes, PA-C (rheumatology); Tommi Rumps, MD (PCP) SOURCE: Patient, notes from rheumatology/PCP, hospital notes, laboratory reports.  _________________________________   HISTORICAL  CHIEF COMPLAINT:  Chief Complaint  Patient presents with  . New Patient (Initial Visit)    RM 13, alone. Paper referral from Marella Chimes, [place] for neuropathy in feet, has hx of gouty arthritis. PVD and ESRD on HD.  5 stents in left leg/has had aneurysm. Pt reports main concern is that he is starting to have pain in right foot. Sees Dr. Carlis Abbott (vascular MD). Pain started back in the summertime/around July/August. Pain is constant. Hard to walk on foot. Feels swollen. Has sore on big toe (started around July/August). Dr. Carlis Abbott is managing this. Feels it is improving. Does dialysis 3 days per week.    HISTORY OF PRESENT ILLNESS:  I had the pleasure seeing your patient, Jeremy [name], at [facility] for neurologic consultation regarding numbness and pain in his feet.  He is a 64 year old man who has a history of gouty arthritis, peripheral vascular disease (has had multiple stents in the left leg), end-stage renal disease on hemodialysis.    He had gangrene from a wound on the right foot this summer and he had a vascular balloon angioplasty procedure July 2021 on the right.  He had not noted pain in his right foot until after the procedure.   Pain is in the right foot and ankle only.  He has good sensation and no pain above the proximal ankle.     Pain is severe with a burning quality.   He has a swollen sensation in the dorsum of the right foot. He has a long history of PVD and has had multi[le angioplasties and stents.  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  He has had some numbness in inner left thigh to inner lower leg since one of the procedures.     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   He has had kidney transplant x 2 but the second one (performed in 2014) failed a couple years later (2017) and he has been back on dialysis since 2017.    He tried gabapentin a few weeks but noted no difference so he stopped.   He only went up two pills due to dizziness.    Records from his hospitalizations were reviewed.  REVIEW OF SYSTEMS: Constitutional: No fevers, chills, sweats, or change in appetite Eyes: No visual changes, double vision, eye pain Ear, nose and throat: Right  hearing loss.    No ear pain, nasal congestion, sore throat Cardiovascular: No chest pain, has palpitations.  He has AFib Respiratory: No shortness of breath at rest or with exertion.   No wheezes.  SOme snoring GastrointestinaI: No nausea, vomiting, diarrhea, abdominal pain, fecal incontinence Genitourinary: No dysuria, urinary retention or frequency.  No nocturia.  Has ED Musculoskeletal: No neck pain, back pain Integumentary: No rash, pruritus, skin lesions Neurological: as above Psychiatric: No depression at this time.  No anxiety Endocrine: No palpitations, diaphoresis, change in appetite, change in weigh or increased thirst Hematologic/Lymphatic: No anemia.  He bruises easily. Allergic/Immunologic: No itchy/runny eyes, nasal congestion, recent allergic reactions, rashes  ALLERGIES: Allergies  Allergen Reactions  . Statins Other (See Comments)    Other reaction(s): Arthralgias (intolerance), Myalgias (  intolerance)  Pt taking pravastatin   . Sertraline Rash    HOME MEDICATIONS:  Current Outpatient Medications:  .  acetaminophen (TYLENOL) 500 MG tablet, Take 1,000 mg 2 (two) times daily as needed by mouth for moderate pain., Disp: , Rfl:  .  allopurinol (ZYLOPRIM) 300 MG tablet, Take 300 mg by mouth daily., Disp: , Rfl: 5 .  azelastine (ASTELIN) 0.1 % nasal spray, PLACE 1 SPRAY INTO BOTH  NOSTRILS 2 (TWO) TIMES DAILY. USE IN EACH NOSTRIL AS DIRECTED, Disp: 30 mL, Rfl: 3 .  benzonatate (TESSALON) 200 MG capsule, Take 1 capsule (200 mg total) by mouth 3 (three) times daily as needed for cough., Disp: 30 capsule, Rfl: 1 .  cholecalciferol (VITAMIN D3) 25 MCG (1000 UNIT) tablet, Take 1,000 Units by mouth daily., Disp: , Rfl:  .  cinacalcet (SENSIPAR) 30 MG tablet, Take 30 mg by mouth daily. , Disp: , Rfl:  .  clopidogrel (PLAVIX) 75 MG tablet, Take 1 tablet (75 mg total) by mouth daily with breakfast., Disp: 90 tablet, Rfl: 3 .  Epoetin Alfa-epbx (RETACRIT IJ), Epoetin alfa - epbx (Retacrit), Disp: , Rfl:  .  ezetimibe (ZETIA) 10 MG tablet, TAKE 1 TABLET BY MOUTH EVERY DAY, Disp: 90 tablet, Rfl: 1 .  gabapentin (NEURONTIN) 100 MG capsule, Take by mouth., Disp: , Rfl:  .  hydroxychloroquine (PLAQUENIL) 200 MG tablet, Take 200 mg by mouth 2 (two) times daily., Disp: , Rfl:  .  iron sucrose in sodium chloride 0.9 % 100 mL, Iron Sucrose (Venofer), Disp: , Rfl:  .  lidocaine-prilocaine (EMLA) cream, Apply 1 application topically as needed (for fistula). Monday, Wednesday, Friday for dialysis, Disp: , Rfl:  .  LOPERAMIDE HCL PO, Take by mouth., Disp: , Rfl:  .  loratadine (CLARITIN) 10 MG tablet, Take 10 mg by mouth daily., Disp: , Rfl:  .  metoprolol tartrate 75 MG TABS, Take 75 mg by mouth 2 (two) times daily., Disp: 180 tablet, Rfl: 3 .  midodrine (PROAMATINE) 10 MG tablet, Take 10 mg by mouth 3 (three) times a week. Mon, Wed, Friday, Disp: , Rfl:  .  multivitamin (RENA-VIT) TABS tablet, Take 1 tablet by mouth daily., Disp: , Rfl:  .  oxyCODONE (OXY IR/ROXICODONE) 5 MG immediate release tablet, Take 1 tablet (5 mg total) by mouth every 8 (eight) hours as needed for breakthrough pain., Disp: 30 tablet, Rfl: 0 .  pantoprazole (PROTONIX) 40 MG tablet, Take 1 tablet (40 mg total) by mouth daily., Disp: 90 tablet, Rfl: 3 .  predniSONE (DELTASONE) 5 MG tablet, Take 5 mg by mouth daily., Disp: ,  Rfl:  .  rosuvastatin (CRESTOR) 40 MG tablet, Take 1 tablet (40 mg total) by mouth daily at 6 PM., Disp: 90 tablet, Rfl: 3 .  sevelamer carbonate (RENVELA) 800 MG tablet, Take 3,200 mg by mouth 3 (three) times daily with meals. 4 tabs per meal, Disp: , Rfl:  .  silver sulfADIAZINE (SILVADENE) 1 % cream, Apply 1 application topically daily. Apply to right great toe and heel, Disp: , Rfl:  .  traZODone (DESYREL) 50 MG tablet, TAKE 1/2 -1 TABLET (25-50 MG TOTAL) BY MOUTH AT BEDTIME AS NEEDED FOR SLEEP. (Patient taking differently: Take 50 mg by mouth at bedtime as needed for sleep.), Disp: 90 tablet, Rfl: 2 .  warfarin (COUMADIN) 1 MG tablet, TAKE 1 MG DAILY ON EVERY DAY EXCEPT TAKE 0.5 MG ON FRIDAYS. *OR AS DIRECTED*, Disp: 90 tablet, Rfl: 3 .  zinc sulfate 220 (50  Zn) MG capsule, Take 220 mg by mouth daily., Disp: , Rfl:  .  nitroGLYCERIN (NITROSTAT) 0.4 MG SL tablet, Place 1 tablet (0.4 mg total) under the tongue every 5 (five) minutes as needed for chest pain., Disp: 25 tablet, Rfl: 3  Current Facility-Administered Medications:  .  0.9 %  sodium chloride infusion, 250 mL, Intravenous, PRN, Marty Heck, MD  PAST MEDICAL HISTORY: Past Medical History:  Diagnosis Date  . Abnormal stress test 03/19/2018  . 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 Aortic stenosis    a. 2014 s/p mech AVR ([facility])-->chronic coumadin; b. 08/2018 Echo: Mild AS/AI. Nl fxn/gradient.  . Arthritis   . Atrial fibrillation ([place])   . CAD (coronary artery disease)    a. 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 02/2018 MV [facility]): large inf, infsept, inflat infarct w/ min rev, EF 30%; c. 06/2018 Cath: LM min irregs, LAD 60p, 95/30/83m, LCX min irregs, RCA 40p, 66m, 90d, RPAV 90, RPDA 95ost; d. 09/2018 CABGx3 (LIMA->LAD, VG->D1, VG->PDA).  . Chronic anticoagulation 06/2012   a. Coumadin in the setting of mech AVR and afib.  Marland Kitchen COVID-19 03/15/2019  . CPAP  (continuous positive airway pressure) dependence   . Dialysis patient [facility]) 04/07/2006   Patient started HD around 1996,  had 1st transplant LLQ around 1998 until 2007 when they found renal cell cancer in transplant and both native kidneys, all were removed > went back on HD until 2nd transplant RLQ in 2014 which lasted 3 yrs; it was embolized for suspected acute rejection. Is currently on HD MWF in [place] w/ [facility] neph[facility].  . ESRD (end stage renal disease) ([place])    a. CKD 2/2 to IgA nephropathy (dx age 88); b. 1999 s/p Kidney transplant; c. 2014 Bilat nephrectomy (transplanted kidney resected) in the setting of renal cell carcinoma; d. PD until 11/2017-->HD.  Marland Kitchen Heart murmur   . HFrEF (heart failure with reduced ejection fraction) ([place])    a. 07/2016 Echo: EF 55-60%; b. 02/2018 MV: EF 30%; c. 04/2018 Echo: EF 35%; d. 08/2018 Echo: EF 30-35%. Glob HK w/o rwma. Mildly reduced RV fxn. Mod to sev dil LA. Nl AoV fxn.  Marland Kitchen Hx of colonoscopy    2009 by Dr. Laural Golden. Normal except for small submucosal lipoma. No evidence of polyps or colitis.  . Hyperlipidemia   . Hypertension   . Hypogonadism in male 07/25/2015  . Ischemic cardiomyopathy    a. 07/2016 Echo: EF 55-60%; b. 02/2018 MV: EF 30%; c. 04/2018 Echo: EF 35%; d. 08/2018 Echo: EF 30-35%.  . OSA (obstructive sleep apnea)    No CPAP  . Permanent atrial fibrillation (HCC)    a. CHA2DS2VASc = 3-->Coumadin (also mech AVR); b. 06/2018 Amio started 2/2 elevated rates.  . Renal cell cancer ([place])   . Renal cell carcinoma ([place]) 08/04/2016    PAST SURGICAL HISTORY: Past Surgical History:  Procedure Laterality Date  . ABDOMINAL AORTOGRAM W/LOWER EXTREMITY N/A 12/22/2019   Procedure: ABDOMINAL AORTOGRAM W/LOWER EXTREMITY;  Surgeon: Marty Heck, MD;  Location: [place] [facility];  Service: Cardiovascular;  Laterality: N/A;  . AORTIC VALVE REPLACEMENT  3 /11 /2014   At [place]    . CAPD INSERTION N/A  02/19/2017   Procedure: LAPAROSCOPIC INSERTION CONTINUOUS AMBULATORY PERITONEAL DIALYSIS  (CAPD) CATHETER;  Surgeon: Algernon Huxley, MD;  Location: [facility];  Service:  General;  Laterality: N/A;  . CORONARY ANGIOPLASTY WITH STENT PLACEMENT    . CORONARY ARTERY BYPASS GRAFT N/A 09/16/2018   Procedure: CORONARY ARTERY BYPASS GRAFT times three      with left internal mammary artery and endoharvest of right leg greater saphenous vein;  Surgeon: Melrose Nakayama, MD;  Location: [place];  Service: Open Heart Surgery;  Laterality: N/A;  . CORONARY STENT INTERVENTION N/A 07/15/2019   Procedure: CORONARY STENT INTERVENTION;  Surgeon: Wellington Hampshire, MD;  Location: [place] [facility];  Service: Cardiovascular;  Laterality: N/A;  . DG AV DIALYSIS  SHUNT ACCESS EXIST*L* OR     left arm  . EPIGASTRIC HERNIA REPAIR N/A 02/19/2017   Procedure: HERNIA REPAIR EPIGASTRIC ADULT;  Surgeon: Robert Bellow, MD;  Location: [facility];  Service: General;  Laterality: N/A;  . EYE SURGERY     bilateral cataract  . HERNIA REPAIR     UMBILICAL HERNIA  . HERNIA REPAIR  02/19/2017  . INNER EAR SURGERY     20 years ago  . KIDNEY TRANSPLANT    . LEFT HEART CATH AND CORS/GRAFTS ANGIOGRAPHY N/A 07/15/2019   Procedure: LEFT HEART CATH AND CORS/GRAFTS ANGIOGRAPHY;  Surgeon: Wellington Hampshire, MD;  Location: [place] [facility];  Service: Cardiovascular;  Laterality: N/A;  . LOWER EXTREMITY ANGIOGRAPHY Right 08/25/2019   Procedure: LOWER EXTREMITY ANGIOGRAPHY;  Surgeon: Algernon Huxley, MD;  Location: [place] [facility];  Service: Cardiovascular;  Laterality: Right;  . LOWER EXTREMITY ANGIOGRAPHY Left 09/29/2019   Procedure: Lower Extremity Angiography;  Surgeon: Algernon Huxley, MD;  Location: [place] [facility];  Service: Cardiovascular;  Laterality: Left;  . PERIPHERAL VASCULAR INTERVENTION Right 12/22/2019   Procedure: PERIPHERAL VASCULAR INTERVENTION;  Surgeon: Marty Heck, MD;  Location: [place] [facility];  Service:  Cardiovascular;  Laterality: Right;  . Peritoneal Dialysis access placement  02/19/2017  . PSEUDOANERYSM COMPRESSION N/A 09/29/2019   Procedure: PSEUDOANERYSM COMPRESSION;  Surgeon: Algernon Huxley, MD;  Location: [place] [facility];  Service: Cardiovascular;  Laterality: N/A;  . REMOVAL OF A DIALYSIS CATHETER N/A 11/23/2017   Procedure: REMOVAL OF A PERITONEAL DIALYSIS CATHETER;  Surgeon: Algernon Huxley, MD;  Location: [facility];  Service: Vascular;  Laterality: N/A;  . RIGHT/LEFT HEART CATH AND CORONARY ANGIOGRAPHY Bilateral 06/07/2018   Procedure: RIGHT/LEFT HEART CATH AND CORONARY ANGIOGRAPHY;  Surgeon: Wellington Hampshire, MD;  Location: [place] [facility];  Service: Cardiovascular;  Laterality: Bilateral;  . TEE WITHOUT CARDIOVERSION N/A 07/21/2016   Procedure: TRANSESOPHAGEAL ECHOCARDIOGRAM (TEE);  Surgeon: Wellington Hampshire, MD;  Location: [facility];  Service: Cardiovascular;  Laterality: N/A;  . TEE WITHOUT CARDIOVERSION N/A 09/16/2018   Procedure: TRANSESOPHAGEAL ECHOCARDIOGRAM (TEE);  Surgeon: Melrose Nakayama, MD;  Location: [place];  Service: Open Heart Surgery;  Laterality: N/A;    FAMILY HISTORY: Family History  Problem Relation Age of Onset  . Cancer Mother        pancreatic  . Heart disease Father   . Diabetes Father     SOCIAL HISTORY:  Social History   Socioeconomic History  . Marital status: Divorced    Spouse name: Not on file  . Number of children: 1  . Years of education: 69  . Highest education level: High school graduate  Occupational History  . Occupation: retired Forensic scientist -Disabled    Comment: Psychologist, educational  Tobacco Use  . Smoking status: Never Smoker  . Smokeless tobacco: Never Used  Vaping Use  .  Vaping Use: Never used  Substance and Sexual Activity  . Alcohol use: Not Currently  . Drug use: No  . Sexual activity: Not Currently    Partners: Female  Other Topics Concern  . Not on file  Social History Narrative   Lives at home with girlfriend   1  daughter 17    Right handed    Social Determinants of Health   Financial Resource Strain: Not on file  Food Insecurity: Not on file  Transportation Needs: Not on file  Physical Activity: Not on file  Stress: Not on file  Social Connections: Not on file  Intimate Partner Violence: Not on file     PHYSICAL EXAM  Vitals:   03/20/20 1411  BP: 125/73  Pulse: 91  SpO2: 98%  Weight: 171 lb (77.6 kg)  Height: 5\' 10"  (1.778 m)    Body mass index is 24.54 kg/m.   General: The patient is well-developed and well-nourished and in no acute distress  HEENT:  Head is /AT.  Sclera are anicteric.  Funduscopic exam shows normal optic discs and retinal vessels.  Neck: No carotid bruits are noted.  The neck is nontender.  Cardiovascular: The heart has a regular rate and rhythm with a normal S1 and S2. There were no murmurs, gallops or rubs.    Skin/Extremity: Sore on left great toe.  Skin is dusky on the right.  The left foot is relatively more normal.  Musculoskeletal:  Back is nontender  Neurologic Exam  Mental status: The patient is alert and oriented x 3 at the time of the examination. The patient has apparent normal recent and remote memory, with an apparently normal attention span and concentration ability.   Speech is normal.  Cranial nerves: Extraocular movements are full. Pupils are equal, round, and reactive to light and accomodation.  Visual fields are full.  Facial symmetry is present. There is good facial sensation to soft touch bilaterally.Facial strength is normal.  Trapezius and sternocleidomastoid strength is normal. No dysarthria is noted.  The tongue is midline, and the patient has symmetric elevation of the soft palate. Mild reduced hearing on the right.  Weber did not lateralize.   Motor:  Muscle bulk is normal.   Tone is normal. Strength is  5 / 5 in all 4 extremities.   Sensory: Sensory testing is intact to pinprick, soft touch and vibration sensation in arms and  the right foot.  He has near absent vibration sensation at the right ankle and absent sensation at the toes.   He has no sensation to pinprick in the right toes and reduced sensation at ankles until mid lower leg.     Coordination: Cerebellar testing reveals good finger-nose-finger and reduced right  heel-to-shin bilaterally.  Gait and station: Station is normal.   Gait shows a minimal right foot drop.  Stride is mildly reduced in the station is wide.  Romberg is negative.   Reflexes: Deep tendon reflexes are 1 in the arms and absent in legs.   Plantar responses are flexor.    DIAGNOSTIC DATA (LABS, IMAGING, TESTING) - I reviewed patient records, labs, notes, testing and imaging myself where available.  Lab Results  Component Value Date   WBC 5.0 12/24/2019   HGB 10.7 (L) 12/24/2019   HCT 36.2 (L) 12/24/2019   MCV 107.7 (H) 12/24/2019   PLT 69 (L) 12/24/2019      Component Value Date/Time   NA 136 12/23/2019 0858   NA 143 04/14/2019 0000  K 5.2 (H) 12/23/2019 0858   CL 96 (L) 12/23/2019 0858   CO2 27 12/23/2019 0858   GLUCOSE 79 12/23/2019 0858   BUN 37 (H) 12/23/2019 0858   BUN 20 04/14/2019 0000   CREATININE 7.45 (H) 12/23/2019 0858   CALCIUM 8.8 (L) 12/23/2019 0858   CALCIUM 9.2 12/14/2015 1059   PROT 6.9 12/21/2019 1639   PROT 7.1 04/26/2019 0938   ALBUMIN 3.2 (L) 12/23/2019 0858   ALBUMIN 4.3 04/26/2019 0938   AST 29 12/21/2019 1639   ALT 25 12/21/2019 1639   ALKPHOS 148 (H) 12/21/2019 1639   BILITOT 0.9 12/21/2019 1639   BILITOT 0.5 04/26/2019 0938   GFRNONAA 7 (L) 12/23/2019 0858   GFRAA 8 (L) 12/23/2019 0858   Lab Results  Component Value Date   CHOL 136 06/21/2019   HDL 33 (L) 06/21/2019   LDLCALC 73 06/21/2019   LDLDIRECT 61.0 08/11/2017   TRIG 150 (H) 06/21/2019   CHOLHDL 4.1 06/21/2019   Lab Results  Component Value Date   HGBA1C 5.9 (H) 09/15/2018   Lab Results  Component Value Date   [id number] 827 04/08/2017   No results found for:  TSH     ASSESSMENT AND PLAN  PAD (peripheral artery disease) (HCC)  Demand ischemia (HCC)  Ischemic neuropathy of right foot  Femoral neuropathy of left lower extremity   In summary, Mr. Maddy is a 64 year old man with painful dysesthesias in the right lower leg and foot.  He most likely has an ischemic neuropathy of the right foot due to his peripheral artery disease.  He also has a femoral/saphenous sensory neuropathy on the left which could be related to some of his procedures.  He has quite a bit of pain in the right foot.  Fortunately he does not note much pain in the left leg.  I will start lamotrigine and titrate up to 50 mg p.o. twice daily.  The dose can be increased further as tolerated.  I will hold off on NCV/EMG study at this time but if symptoms do not improve we will can consider testing.  He will return to see [name] in 3-4 months or sooner if there are new or worsening neurologic symptoms.  He will call back in a few weeks if he does not note any improvement.  Thank you for asking me to see Mr. Lanae Crumbly.  Please let me know if I can be of further assistance with him or other patients in the future.   Donni Oglesby A. Felecia Shelling, MD, [facility] 07/86/7544, 9:20 PM Certified in Neurology, Clinical Neurophysiology, Sleep Medicine and Neuroimaging  [facility] [address] [place], [place] [address] [phone]

## 2020-03-20 NOTE — Patient Instructions (Signed)
The pharmacy has the prescription of lamotrigine 25 mg. Please take it as follows: For 5 days  take 1 pill once a day. For next 5 days, take 1 pill twice a day. For next 5 days, take 1 pill in the morning and 2 at bedtime or evening. Then  take 2 pills twice a day.   If you do get a significant rash stop the medicine immediately and let us know.

## 2020-03-21 ENCOUNTER — Ambulatory Visit (INDEPENDENT_AMBULATORY_CARE_PROVIDER_SITE_OTHER): Payer: Medicare Other

## 2020-03-21 DIAGNOSIS — Z952 Presence of prosthetic heart valve: Secondary | ICD-10-CM

## 2020-03-21 DIAGNOSIS — I4811 Longstanding persistent atrial fibrillation: Secondary | ICD-10-CM

## 2020-03-21 DIAGNOSIS — Z5181 Encounter for therapeutic drug level monitoring: Secondary | ICD-10-CM

## 2020-03-21 NOTE — Telephone Encounter (Signed)
Patient calling to self report poct inr    3.1 taken 03/20/20

## 2020-03-21 NOTE — Telephone Encounter (Signed)
Please see anti-coag note for today. 

## 2020-03-21 NOTE — Patient Instructions (Signed)
Sent MyChart message to pt and advised him to :  - continue to take 1 tablet of warfarin daily.  - Recheck INR in 1 week

## 2020-03-27 ENCOUNTER — Encounter: Payer: Self-pay | Admitting: Cardiovascular Disease

## 2020-03-27 LAB — LAB REPORT - SCANNED
INR: 2.3
INR: 2.3

## 2020-03-28 ENCOUNTER — Ambulatory Visit (INDEPENDENT_AMBULATORY_CARE_PROVIDER_SITE_OTHER): Payer: Medicare Other

## 2020-03-28 DIAGNOSIS — Z952 Presence of prosthetic heart valve: Secondary | ICD-10-CM

## 2020-03-28 DIAGNOSIS — D631 Anemia in chronic kidney disease: Secondary | ICD-10-CM

## 2020-03-28 DIAGNOSIS — N189 Chronic kidney disease, unspecified: Secondary | ICD-10-CM | POA: Diagnosis not present

## 2020-03-28 DIAGNOSIS — I4811 Longstanding persistent atrial fibrillation: Secondary | ICD-10-CM | POA: Diagnosis not present

## 2020-03-28 LAB — POCT INR: INR: 2.3 (ref 2.0–3.0)

## 2020-03-28 NOTE — Patient Instructions (Signed)
Sent MyChart message to pt and advised him to :  - take extra 1/2 tablet today, then  - continue to take 1 tablet of warfarin daily.  - Recheck INR in 1 week

## 2020-04-03 LAB — POCT INR: INR: 3.6 — AB (ref 2.0–3.0)

## 2020-04-04 ENCOUNTER — Encounter: Payer: Self-pay | Admitting: Cardiovascular Disease

## 2020-04-04 ENCOUNTER — Ambulatory Visit (INDEPENDENT_AMBULATORY_CARE_PROVIDER_SITE_OTHER): Payer: Medicare Other

## 2020-04-04 DIAGNOSIS — Z952 Presence of prosthetic heart valve: Secondary | ICD-10-CM | POA: Diagnosis not present

## 2020-04-04 DIAGNOSIS — Z5181 Encounter for therapeutic drug level monitoring: Secondary | ICD-10-CM

## 2020-04-04 DIAGNOSIS — I4811 Longstanding persistent atrial fibrillation: Secondary | ICD-10-CM

## 2020-04-04 NOTE — Patient Instructions (Signed)
Sent MyChart message to pt and advised him to :  -take 1/2 tablet today, then  - continue to take 1 tablet of warfarin daily.  - Recheck INR in 1 week

## 2020-04-05 ENCOUNTER — Telehealth: Payer: Self-pay

## 2020-04-05 NOTE — Telephone Encounter (Signed)
Patient called to report some ulcerations between the toes in addition to the non healing toe ulcer from before. It is painful if he dangles his legs or walks, but otherwise if he is elevating his legs, it does not hurt. Some blood tinged drainage, denies odor, pus, or fever. He has an appt on 04/10/20 with arterial duplex and follow up with Dr. Carlis Abbott that he will keep.

## 2020-04-06 DIAGNOSIS — N186 End stage renal disease: Secondary | ICD-10-CM | POA: Diagnosis not present

## 2020-04-06 DIAGNOSIS — Z992 Dependence on renal dialysis: Secondary | ICD-10-CM | POA: Diagnosis not present

## 2020-04-06 DIAGNOSIS — T8610 Unspecified complication of kidney transplant: Secondary | ICD-10-CM | POA: Diagnosis not present

## 2020-04-09 ENCOUNTER — Other Ambulatory Visit
Admission: RE | Admit: 2020-04-09 | Discharge: 2020-04-09 | Disposition: A | Discharge status: Home / Self Care | Payer: Medicare Other | Source: Ambulatory Visit | Attending: Cardiology | Admitting: Cardiology

## 2020-04-09 ENCOUNTER — Telehealth: Payer: Self-pay | Admitting: Cardiovascular Disease

## 2020-04-09 DIAGNOSIS — I4891 Unspecified atrial fibrillation: Secondary | ICD-10-CM | POA: Diagnosis present

## 2020-04-09 DIAGNOSIS — Z952 Presence of prosthetic heart valve: Secondary | ICD-10-CM | POA: Diagnosis present

## 2020-04-09 DIAGNOSIS — I482 Chronic atrial fibrillation, unspecified: Secondary | ICD-10-CM

## 2020-04-09 DIAGNOSIS — N186 End stage renal disease: Secondary | ICD-10-CM | POA: Diagnosis not present

## 2020-04-09 DIAGNOSIS — N2581 Secondary hyperparathyroidism of renal origin: Secondary | ICD-10-CM | POA: Diagnosis not present

## 2020-04-09 DIAGNOSIS — D631 Anemia in chronic kidney disease: Secondary | ICD-10-CM | POA: Diagnosis not present

## 2020-04-09 DIAGNOSIS — Z992 Dependence on renal dialysis: Secondary | ICD-10-CM | POA: Diagnosis not present

## 2020-04-09 LAB — PROTIME-INR
INR: 10.2 (ref 0.8–1.2)
Prothrombin Time: 78.6 seconds — ABNORMAL HIGH (ref 11.4–15.2)

## 2020-04-09 MED ORDER — PHYTONADIONE 5 MG PO TABS: 2.5000 mg | ORAL_TABLET | Freq: Once | ORAL | 0 refills | Status: AC

## 2020-04-09 NOTE — Telephone Encounter (Signed)
If Home Health RN is calling please get Coumadin Nurse on the phone STAT  1.  Are you calling in regards to an appointment? Yes requested to come into office to test scheduled now 1/5 at 245.  Home testing results 1/2 was 8.0 .  Patient states he has started supplements and has been self testing every other day.    2.  Are you calling for a refill ? no  3.  Are you having bleeding issues? no  4.  Do you need clearance to hold Coumadin? No    Please route to the Coumadin Clinic Pool

## 2020-04-09 NOTE — Telephone Encounter (Signed)
Received critical lab value from [facility]. Patient's INR >10.  Routing to Pharmacy.

## 2020-04-09 NOTE — Telephone Encounter (Signed)
Called patient and advised that I would send Rx for Vit K to the pharmacy for him. He is to take 2.5mg  (1/2 tablet). If he cannot find a pharmacy that has it, he is to buy a can of spinach and eat half of it. Pt denies any bleeding. Advised to go to the ER with any bleeding. Recheck INR on thursday

## 2020-04-09 NOTE — Telephone Encounter (Signed)
Please see previous phone note.  

## 2020-04-09 NOTE — Telephone Encounter (Signed)
Patient would like a call "ASAP".

## 2020-04-09 NOTE — Telephone Encounter (Signed)
Spoke w/ pt.  Advised him that STAT INR results have not come back yet, but to hold his warfarin tonight and have a serving of greens.  Advised him that he will receive a call tomorrow w/ further instructions.  He is agreeable and appreciative of the call.

## 2020-04-09 NOTE — Telephone Encounter (Signed)
Spoke w/ pt.  He reports that w/ new test strips his INR is still >8.0. Advised pt to proceed to [facility] for stat venipuncture.  He is agreeable and will go there now.

## 2020-04-09 NOTE — Telephone Encounter (Signed)
Spoke w/ pt.  He reports that he has recently started a new program to "get the blood flowing in his legs". He is taking "a lot of new supplements", but he is unsure what they are. He reports that he has been checking his INR every other day and his readings have been good, but last night jumped up to 8.0. He reports that he brought the machine to dialysis and had them take blood from his port and put in the machine, but it would not read. Advised pt that the reagent in the test strips may be bad and asked the he restest w/ a new bottle of strips. If his reading is still high, he will have to go to the lab for venipuncture.  He is agreeable to this if he has to go, as he wants to get the correct reading. Advised pt that I am in the [facility] office today and asked that he call me directly w/ his reading once he gets home.

## 2020-04-10 ENCOUNTER — Other Ambulatory Visit: Payer: Self-pay

## 2020-04-10 ENCOUNTER — Ambulatory Visit (INDEPENDENT_AMBULATORY_CARE_PROVIDER_SITE_OTHER)
Admission: RE | Admit: 2020-04-10 | Discharge: 2020-04-10 | Disposition: A | Discharge status: Home / Self Care | Payer: Medicare Other | Source: Ambulatory Visit | Attending: Vascular Surgery | Admitting: Vascular Surgery

## 2020-04-10 ENCOUNTER — Ambulatory Visit (INDEPENDENT_AMBULATORY_CARE_PROVIDER_SITE_OTHER): Payer: Medicare Other | Admitting: Vascular Surgery

## 2020-04-10 ENCOUNTER — Encounter: Payer: Self-pay | Admitting: Vascular Surgery

## 2020-04-10 VITALS — BP 138/77 | HR 113 | Temp 98.2°F | Resp 16 | Ht 70.0 in | Wt 166.0 lb

## 2020-04-10 DIAGNOSIS — I739 Peripheral vascular disease, unspecified: Secondary | ICD-10-CM

## 2020-04-10 MED ORDER — OXYCODONE HCL 5 MG PO TABS: 5.0000 mg | ORAL_TABLET | ORAL | 0 refills | Status: DC | PRN

## 2020-04-10 NOTE — Progress Notes (Signed)
Patient name: Jeremy [name] MRN: [id number] DOB: 16-Sep-1955 Sex: male  REASON FOR VISIT: 1 month follow-up for wound check of right great toe for CLI with tissue loss  HPI: DMARCO BALDUS is a 65 y.o. male with multiple medical problems including atrial fibrillation on Coumadin, peripheral arterial disease, coronary artery disease status post CABG and subsequent PCI, aortic stenosis with mechanical aortic valve, ESRD, hypertension, hyperlipidemia that presents for 1 month follow-up and wound check of right foot ulcer.  He was previously seen by Dr. Donnetta Hutching for a second opinion and since Dr. Donnetta Hutching moved to [place] I was asked to follow his care here in [place].    The right foot got worse and we subsequently intervened with a retrograde right PT intervention and angioplasty of the PT TP trunk on 12/22/2019.  He is keeping Santyl on the toe prescribed by his podiatrist.  The foot has gotten much worse since I last saw him 1 month ago.  He did some biofeedback therapy and saw a chiropractor in the interim.  States the right foot hurts a lot all the time.  He has a complex history and had an area of nonhealing ulceration in his right foot and on 08/25/2019 underwent arteriography via the left femoral approach at [facility]. He had angioplasty of the tibioperoneal trunk, peroneal and posterior tibial arteries on the right with drug-eluting balloon. 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 Apparently had complication of bleeding follow this and was taken to the [facility] lab and underwent Viabahn stenting of his deep femoral artery, superficial femoral artery and common femoral artery for control of bleeding at [place].   Past Medical History:  Diagnosis Date  . Abnormal stress test 03/19/2018  . 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 Aortic stenosis    a. 2014 s/p mech AVR ([facility])-->chronic coumadin; b. 08/2018 Echo: Mild AS/AI. Nl fxn/gradient.   . Arthritis   . Atrial fibrillation ([place])   . CAD (coronary artery disease)    a. 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 02/2018 MV [facility]): large inf, infsept, inflat infarct w/ min rev, EF 30%; c. 06/2018 Cath: LM min irregs, LAD 60p, 95/30/10m, LCX min irregs, RCA 40p, 33m, 90d, RPAV 90, RPDA 95ost; d. 09/2018 CABGx3 (LIMA->LAD, VG->D1, VG->PDA).  . Chronic anticoagulation 06/2012   a. Coumadin in the setting of mech AVR and afib.  Marland Kitchen COVID-19 03/15/2019  . CPAP (continuous positive airway pressure) dependence   . Dialysis patient [facility]) 04/07/2006   Patient started HD around 1996,  had 1st transplant LLQ around 1998 until 2007 when they found renal cell cancer in transplant and both native kidneys, all were removed > went back on HD until 2nd transplant RLQ in 2014 which lasted 3 yrs; it was embolized for suspected acute rejection. Is currently on HD MWF in [place] w/ [facility] nephrology.  . ESRD (end stage renal disease) ([place])    a. 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 1999 s/p Kidney transplant; c. 2014 Bilat nephrectomy (transplanted kidney resected) in the setting of renal cell carcinoma; d. PD until 11/2017-->HD.  Marland Kitchen Heart murmur   . HFrEF (heart failure with reduced ejection fraction) ([place])    a. 07/2016 Echo: EF 55-60%; b. 02/2018 MV: EF 30%; c. 04/2018 Echo: EF 35%; d. 08/2018 Echo: EF 30-35%. Glob HK w/o rwma. Mildly reduced RV fxn.  Mod to sev dil LA. Nl AoV fxn.  Marland Kitchen Hx of colonoscopy    2009 by Dr. Laural Golden. Normal except for small submucosal lipoma. No evidence of polyps or colitis.  . Hyperlipidemia   . Hypertension   . Hypogonadism in male 07/25/2015  . Ischemic cardiomyopathy    a. 07/2016 Echo: EF 55-60%; b. 02/2018 MV: EF 30%; c. 04/2018 Echo: EF 35%; d. 08/2018 Echo: EF 30-35%.  . OSA (obstructive sleep apnea)    No CPAP  . Permanent atrial fibrillation (HCC)    a. CHA2DS2VASc = 3-->Coumadin (also mech AVR); b. 06/2018  Amio started 2/2 elevated rates.  . Renal cell cancer ([place])   . Renal cell carcinoma ([place]) 08/04/2016    Past Surgical History:  Procedure Laterality Date  . ABDOMINAL AORTOGRAM W/LOWER EXTREMITY N/A 12/22/2019   Procedure: ABDOMINAL AORTOGRAM W/LOWER EXTREMITY;  Surgeon: Marty Heck, MD;  Location: [place] [facility];  Service: Cardiovascular;  Laterality: N/A;  . AORTIC VALVE REPLACEMENT  3 /11 /2014   At [place]    . CAPD INSERTION N/A 02/19/2017   Procedure: LAPAROSCOPIC INSERTION CONTINUOUS AMBULATORY PERITONEAL DIALYSIS  (CAPD) CATHETER;  Surgeon: Algernon Huxley, MD;  Location: [facility] ORS;  Service: General;  Laterality: N/A;  . CORONARY ANGIOPLASTY WITH STENT PLACEMENT    . CORONARY ARTERY BYPASS GRAFT N/A 09/16/2018   Procedure: CORONARY ARTERY BYPASS GRAFT times three      with left internal mammary artery and endoharvest of right leg greater saphenous vein;  Surgeon: Melrose Nakayama, MD;  Location: [place];  Service: Open Heart Surgery;  Laterality: N/A;  . CORONARY STENT INTERVENTION N/A 07/15/2019   Procedure: CORONARY STENT INTERVENTION;  Surgeon: Wellington Hampshire, MD;  Location: [place] [facility];  Service: Cardiovascular;  Laterality: N/A;  . DG AV DIALYSIS  SHUNT ACCESS EXIST*L* OR     left arm  . EPIGASTRIC HERNIA REPAIR N/A 02/19/2017   Procedure: HERNIA REPAIR EPIGASTRIC ADULT;  Surgeon: Robert Bellow, MD;  Location: [facility] ORS;  Service: General;  Laterality: N/A;  . EYE SURGERY     bilateral cataract  . HERNIA REPAIR     UMBILICAL HERNIA  . HERNIA REPAIR  02/19/2017  . INNER EAR SURGERY     20 years ago  . KIDNEY TRANSPLANT    . LEFT HEART CATH AND CORS/GRAFTS ANGIOGRAPHY N/A 07/15/2019   Procedure: LEFT HEART CATH AND CORS/GRAFTS ANGIOGRAPHY;  Surgeon: Wellington Hampshire, MD;  Location: [place] [facility];  Service: Cardiovascular;  Laterality: N/A;  . LOWER EXTREMITY ANGIOGRAPHY Right 08/25/2019   Procedure:  LOWER EXTREMITY ANGIOGRAPHY;  Surgeon: Algernon Huxley, MD;  Location: [place] [facility];  Service: Cardiovascular;  Laterality: Right;  . LOWER EXTREMITY ANGIOGRAPHY Left 09/29/2019   Procedure: Lower Extremity Angiography;  Surgeon: Algernon Huxley, MD;  Location: [place] [facility];  Service: Cardiovascular;  Laterality: Left;  . PERIPHERAL VASCULAR INTERVENTION Right 12/22/2019   Procedure: PERIPHERAL VASCULAR INTERVENTION;  Surgeon: Marty Heck, MD;  Location: [place] [facility];  Service: Cardiovascular;  Laterality: Right;  . Peritoneal Dialysis access placement  02/19/2017  . PSEUDOANERYSM COMPRESSION N/A 09/29/2019   Procedure: PSEUDOANERYSM COMPRESSION;  Surgeon: Algernon Huxley, MD;  Location: [place] [facility];  Service: Cardiovascular;  Laterality: N/A;  . REMOVAL OF A DIALYSIS CATHETER N/A 11/23/2017   Procedure: REMOVAL OF A PERITONEAL DIALYSIS CATHETER;  Surgeon: Algernon Huxley, MD;  Location: [facility]  ORS;  Service: Vascular;  Laterality: N/A;  . RIGHT/LEFT HEART CATH AND CORONARY ANGIOGRAPHY Bilateral 06/07/2018   Procedure: RIGHT/LEFT HEART CATH AND CORONARY ANGIOGRAPHY;  Surgeon: Wellington Hampshire, MD;  Location: [place] [facility];  Service: Cardiovascular;  Laterality: Bilateral;  . TEE WITHOUT CARDIOVERSION N/A 07/21/2016   Procedure: TRANSESOPHAGEAL ECHOCARDIOGRAM (TEE);  Surgeon: Wellington Hampshire, MD;  Location: [facility] ORS;  Service: Cardiovascular;  Laterality: N/A;  . TEE WITHOUT CARDIOVERSION N/A 09/16/2018   Procedure: TRANSESOPHAGEAL ECHOCARDIOGRAM (TEE);  Surgeon: Melrose Nakayama, MD;  Location: [facility] [place];  Service: Open Heart Surgery;  Laterality: N/A;    Family History  Problem Relation Age of Onset  . Cancer Mother        pancreatic  . Heart disease Father   . Diabetes Father     SOCIAL HISTORY: Social History   Tobacco Use  . Smoking status: Never Smoker  . Smokeless tobacco: Never Used  Substance Use Topics  . Alcohol use: Not Currently    Allergies   Allergen Reactions  . Statins Other (See Comments)    Other reaction(s): Arthralgias (intolerance), Myalgias (intolerance)  Pt taking pravastatin   . Sertraline Rash    Current Outpatient Medications  Medication Sig Dispense Refill  . acetaminophen (TYLENOL) 500 MG tablet Take 1,000 mg 2 (two) times daily as needed by mouth for moderate pain.    Marland Kitchen allopurinol (ZYLOPRIM) 300 MG tablet Take 300 mg by mouth daily.  5  . azelastine (ASTELIN) 0.1 % nasal spray PLACE 1 SPRAY INTO BOTH NOSTRILS 2 (TWO) TIMES DAILY. USE IN EACH NOSTRIL AS DIRECTED 30 mL 3  . benzonatate (TESSALON) 200 MG capsule Take 1 capsule (200 mg total) by mouth 3 (three) times daily as needed for cough. 30 capsule 1  . cholecalciferol (VITAMIN D3) 25 MCG (1000 UNIT) tablet Take 1,000 Units by mouth daily.    . cinacalcet (SENSIPAR) 30 MG tablet Take 30 mg by mouth daily.     . clopidogrel (PLAVIX) 75 MG tablet Take 1 tablet (75 mg total) by mouth daily with breakfast. 90 tablet 3  . Epoetin Alfa-epbx (RETACRIT IJ) Epoetin alfa - epbx (Retacrit)    . ezetimibe (ZETIA) 10 MG tablet TAKE 1 TABLET BY MOUTH EVERY DAY 90 tablet 1  . gabapentin (NEURONTIN) 100 MG capsule Take by mouth.    . hydroxychloroquine (PLAQUENIL) 200 MG tablet Take 200 mg by mouth 2 (two) times daily.    . iron sucrose in sodium chloride 0.9 % 100 mL Iron Sucrose (Venofer)    . lamoTRIgine (LAMICTAL) 25 MG tablet Take 2 tablets (50 mg total) by mouth 2 (two) times daily. 120 tablet 5  . lidocaine-prilocaine (EMLA) cream Apply 1 application topically as needed (for fistula). Monday, Wednesday, Friday for dialysis    . LOPERAMIDE HCL PO Take by mouth.    . loratadine (CLARITIN) 10 MG tablet Take 10 mg by mouth daily.    . metoprolol tartrate 75 MG TABS Take 75 mg by mouth 2 (two) times daily. 180 tablet 3  . midodrine (PROAMATINE) 10 MG tablet Take 10 mg by mouth 3 (three) times a week. Mon, Wed, Friday    . multivitamin (RENA-VIT) TABS tablet Take 1  tablet by mouth daily.    Marland Kitchen oxyCODONE (OXY IR/ROXICODONE) 5 MG immediate release tablet Take 1 tablet (5 mg total) by mouth every 8 (eight) hours as needed for breakthrough pain. 30 tablet 0  . pantoprazole (PROTONIX) 40 MG tablet Take 1 tablet (40  mg total) by mouth daily. 90 tablet 3  . predniSONE (DELTASONE) 5 MG tablet Take 5 mg by mouth daily.    . rosuvastatin (CRESTOR) 40 MG tablet Take 1 tablet (40 mg total) by mouth daily at 6 PM. 90 tablet 3  . sevelamer carbonate (RENVELA) 800 MG tablet Take 3,200 mg by mouth 3 (three) times daily with meals. 4 tabs per meal    . silver sulfADIAZINE (SILVADENE) 1 % cream Apply 1 application topically daily. Apply to right great toe and heel    . traZODone (DESYREL) 50 MG tablet TAKE 1/2 -1 TABLET (25-50 MG TOTAL) BY MOUTH AT BEDTIME AS NEEDED FOR SLEEP. (Patient taking differently: Take 50 mg by mouth at bedtime as needed for sleep.) 90 tablet 2  . warfarin (COUMADIN) 1 MG tablet TAKE 1 MG DAILY ON EVERY DAY EXCEPT TAKE 0.5 MG ON FRIDAYS. *OR AS DIRECTED* 90 tablet 3  . zinc sulfate 220 (50 Zn) MG capsule Take 220 mg by mouth daily.    . nitroGLYCERIN (NITROSTAT) 0.4 MG SL tablet Place 1 tablet (0.4 mg total) under the tongue every 5 (five) minutes as needed for chest pain. 25 tablet 3   Current Facility-Administered Medications  Medication Dose Route Frequency Provider Last Rate Last Admin  . 0.9 %  sodium chloride infusion  250 mL Intravenous PRN Marty Heck, MD        REVIEW OF SYSTEMS:  [X]  denotes positive finding, [ ]  denotes negative finding Cardiac  Comments:  Chest pain or chest pressure:    Shortness of breath upon exertion:    Short of breath when lying flat:    Irregular heart rhythm:        Vascular    Pain in calf, thigh, or hip brought on by ambulation:    Pain in feet at night that wakes you up from your sleep:     Blood clot in your veins:    Leg swelling:         Pulmonary    Oxygen at home:    Productive cough:      Wheezing:         Neurologic    Sudden weakness in arms or legs:     Sudden numbness in arms or legs:     Sudden onset of difficulty speaking or slurred speech:    Temporary loss of vision in one eye:     Problems with dizziness:         Gastrointestinal    Blood in stool:     Vomited blood:         Genitourinary    Burning when urinating:     Blood in urine:        Psychiatric    Major depression:         Hematologic    Bleeding problems:    Problems with blood clotting too easily:        Skin    Rashes or ulcers:        Constitutional    Fever or chills:      PHYSICAL EXAM: Vitals:   04/10/20 1008  BP: 138/77  Pulse: (!) 113  Resp: 16  Temp: 98.2 F (36.8 C)  TempSrc: Temporal  SpO2: 98%  Weight: 166 lb (75.3 kg)  Height: 5\' 10"  (1.778 m)        GENERAL: The patient is a well-nourished male, in no acute distress. The vital signs are documented above. CARDIAC: There is  a regular rate and rhythm.  VASCULAR:  Tissue loss to all right 5 toes with significant dependent rubor in the right foot   DATA:   Duplex today does show dampened monophasic waveforms at the right ankle with evidence of CLI even after multiple previous tibial interventions  Assessment/Plan:  65 year old male status post right leg intervention with retrograde PT/TP trunk angioplasty for CLI with tissue loss on 12/22/19.  This was a second tibial intervention as noted above after a first tibial intervention at [facility] earlier this year.  Unfortunately his foot looks much worse over the last month.  I do not feel the foot is salvageable moving forward.  I recommended a right below-knee amputation and he should have a nice chance to heal this with triphasic flow down to the popliteal artery.  He is on Coumadin and his last INR was 10 yesterday.  I have instructed that he hold his Coumadin tonight and tomorrow night.  I will preadmit him on Thursday for heparin bridge given  mechanical heart valve.  Tentative plan for right BKA on Friday.  Marty Heck, MD [facility] Office: [phone]

## 2020-04-10 NOTE — Telephone Encounter (Signed)
I called pt and left VM reiterating that he is not to take any warfarin until we tell him to. Hold warfarin and recheck INR on Thursday. Ask pt to call us back to confirm he got the message

## 2020-04-11 ENCOUNTER — Telehealth: Payer: Self-pay

## 2020-04-11 ENCOUNTER — Telehealth: Payer: Self-pay | Admitting: *Deleted

## 2020-04-11 ENCOUNTER — Ambulatory Visit (INDEPENDENT_AMBULATORY_CARE_PROVIDER_SITE_OTHER): Payer: Medicare Other

## 2020-04-11 DIAGNOSIS — N186 End stage renal disease: Secondary | ICD-10-CM | POA: Diagnosis not present

## 2020-04-11 DIAGNOSIS — Z992 Dependence on renal dialysis: Secondary | ICD-10-CM | POA: Diagnosis not present

## 2020-04-11 DIAGNOSIS — D631 Anemia in chronic kidney disease: Secondary | ICD-10-CM | POA: Diagnosis not present

## 2020-04-11 DIAGNOSIS — Z952 Presence of prosthetic heart valve: Secondary | ICD-10-CM | POA: Diagnosis not present

## 2020-04-11 DIAGNOSIS — I4811 Longstanding persistent atrial fibrillation: Secondary | ICD-10-CM

## 2020-04-11 DIAGNOSIS — Z5181 Encounter for therapeutic drug level monitoring: Secondary | ICD-10-CM | POA: Diagnosis not present

## 2020-04-11 DIAGNOSIS — N2581 Secondary hyperparathyroidism of renal origin: Secondary | ICD-10-CM | POA: Diagnosis not present

## 2020-04-11 LAB — POCT INR: INR: 6.7 — AB (ref 2.0–3.0)

## 2020-04-11 NOTE — Telephone Encounter (Signed)
Called patient back and explained that surgery was not cancelled, he would be admitted tomorrow and they would evaluate his labs and manage INR in house. Patient verbalizes understanding.

## 2020-04-11 NOTE — Telephone Encounter (Signed)
Patient calling in stating he is being admitted Thursday for an amputation. Patient would like a call to discuss INR and appointment for today  Please advise

## 2020-04-11 NOTE — Progress Notes (Signed)
Pt reports that he is to start heparin drip tomorrow in prep for Rt BKA on Friday.  He reports the he received a call from [place] to discuss possible hyperbaric chamber tx, but pt would like Dr. Tyrell Antonio recommendation.  Pt kept asking me what I would do in his situation, but advised pt that I don't have enough information to begin to offer any advice.   Asked [name], Dr. Tyrell Antonio nurse, to speak w/ pt, as well. She will sent a message to Dr. [name] and see if would like to weigh in on pt's issues.  Spoke w/ Dr. Samuel Germany office - his nurse states that surgery will most likely need to be rescheduled, but she will speak w/ Dr. Loletta Specter and call pt back directly.  Pt has stopped all supplements.

## 2020-04-11 NOTE — Telephone Encounter (Signed)
Spoke w/ pt.  He reports that he picked up vit k 5 mg and took 1/2 tablet yesterday, but is unsure what to take today.  Advised him that was a one-time dosage and any further doses will depend on his INR. He is scheduled for Rt BKA on Friday - advised him that if his INR is too high, they may not be able to proceed w/ surgery.  Pt states that he was unaware of his appt for INR check w/ me today.  Advised pt to come into appt w/ today and if his INR is >8.0, we can have stat labs drawn in the office and be able to dose his vit k if needed.   He is currently at dialysis until about 1:30 & will come over as soon as he is done.

## 2020-04-11 NOTE — Patient Instructions (Addendum)
Please hold your warfarin until you hear from Dr. Samuel Germany office. Recheck INR on Friday if your surgery is rescheduled. Please call the [place] office at [phone] with your results.

## 2020-04-11 NOTE — Telephone Encounter (Signed)
Called pt and left VM to call back regarding surgery Friday and INR of 6.7.

## 2020-04-12 ENCOUNTER — Inpatient Hospital Stay (HOSPITAL_COMMUNITY)
Admission: RE | Admit: 2020-04-12 | Discharge: 2020-04-23 | DRG: 239 | Disposition: A | Discharge status: Rehabilitation Facility | Payer: Medicare Other | Attending: Vascular Surgery | Admitting: Vascular Surgery

## 2020-04-12 DIAGNOSIS — G4733 Obstructive sleep apnea (adult) (pediatric): Secondary | ICD-10-CM | POA: Diagnosis present

## 2020-04-12 DIAGNOSIS — I70229 Atherosclerosis of native arteries of extremities with rest pain, unspecified extremity: Secondary | ICD-10-CM | POA: Diagnosis not present

## 2020-04-12 DIAGNOSIS — N2581 Secondary hyperparathyroidism of renal origin: Secondary | ICD-10-CM | POA: Diagnosis not present

## 2020-04-12 DIAGNOSIS — Z833 Family history of diabetes mellitus: Secondary | ICD-10-CM | POA: Diagnosis not present

## 2020-04-12 DIAGNOSIS — Z951 Presence of aortocoronary bypass graft: Secondary | ICD-10-CM | POA: Diagnosis not present

## 2020-04-12 DIAGNOSIS — I132 Hypertensive heart and chronic kidney disease with heart failure and with stage 5 chronic kidney disease, or end stage renal disease: Secondary | ICD-10-CM | POA: Diagnosis not present

## 2020-04-12 DIAGNOSIS — Z952 Presence of prosthetic heart valve: Secondary | ICD-10-CM | POA: Diagnosis not present

## 2020-04-12 DIAGNOSIS — Z7901 Long term (current) use of anticoagulants: Secondary | ICD-10-CM

## 2020-04-12 DIAGNOSIS — Z8616 Personal history of COVID-19: Secondary | ICD-10-CM

## 2020-04-12 DIAGNOSIS — D689 Coagulation defect, unspecified: Secondary | ICD-10-CM | POA: Diagnosis not present

## 2020-04-12 DIAGNOSIS — R911 Solitary pulmonary nodule: Secondary | ICD-10-CM | POA: Diagnosis not present

## 2020-04-12 DIAGNOSIS — R791 Abnormal coagulation profile: Secondary | ICD-10-CM | POA: Diagnosis not present

## 2020-04-12 DIAGNOSIS — D631 Anemia in chronic kidney disease: Secondary | ICD-10-CM | POA: Diagnosis present

## 2020-04-12 DIAGNOSIS — T8612 Kidney transplant failure: Secondary | ICD-10-CM | POA: Diagnosis present

## 2020-04-12 DIAGNOSIS — E8889 Other specified metabolic disorders: Secondary | ICD-10-CM | POA: Diagnosis present

## 2020-04-12 DIAGNOSIS — D638 Anemia in other chronic diseases classified elsewhere: Secondary | ICD-10-CM | POA: Diagnosis not present

## 2020-04-12 DIAGNOSIS — K219 Gastro-esophageal reflux disease without esophagitis: Secondary | ICD-10-CM | POA: Diagnosis present

## 2020-04-12 DIAGNOSIS — G8918 Other acute postprocedural pain: Secondary | ICD-10-CM | POA: Diagnosis not present

## 2020-04-12 DIAGNOSIS — L02415 Cutaneous abscess of right lower limb: Secondary | ICD-10-CM | POA: Diagnosis not present

## 2020-04-12 DIAGNOSIS — D7589 Other specified diseases of blood and blood-forming organs: Secondary | ICD-10-CM | POA: Diagnosis not present

## 2020-04-12 DIAGNOSIS — G546 Phantom limb syndrome with pain: Secondary | ICD-10-CM | POA: Diagnosis present

## 2020-04-12 DIAGNOSIS — I120 Hypertensive chronic kidney disease with stage 5 chronic kidney disease or end stage renal disease: Secondary | ICD-10-CM | POA: Diagnosis not present

## 2020-04-12 DIAGNOSIS — Z85528 Personal history of other malignant neoplasm of kidney: Secondary | ICD-10-CM

## 2020-04-12 DIAGNOSIS — Z888 Allergy status to other drugs, medicaments and biological substances status: Secondary | ICD-10-CM

## 2020-04-12 DIAGNOSIS — I5022 Chronic systolic (congestive) heart failure: Secondary | ICD-10-CM | POA: Diagnosis not present

## 2020-04-12 DIAGNOSIS — Z7902 Long term (current) use of antithrombotics/antiplatelets: Secondary | ICD-10-CM

## 2020-04-12 DIAGNOSIS — J9811 Atelectasis: Secondary | ICD-10-CM | POA: Diagnosis not present

## 2020-04-12 DIAGNOSIS — Z905 Acquired absence of kidney: Secondary | ICD-10-CM

## 2020-04-12 DIAGNOSIS — Z8249 Family history of ischemic heart disease and other diseases of the circulatory system: Secondary | ICD-10-CM

## 2020-04-12 DIAGNOSIS — M069 Rheumatoid arthritis, unspecified: Secondary | ICD-10-CM | POA: Diagnosis not present

## 2020-04-12 DIAGNOSIS — E785 Hyperlipidemia, unspecified: Secondary | ICD-10-CM | POA: Diagnosis present

## 2020-04-12 DIAGNOSIS — S88112A Complete traumatic amputation at level between knee and ankle, left lower leg, initial encounter: Secondary | ICD-10-CM | POA: Diagnosis not present

## 2020-04-12 DIAGNOSIS — N186 End stage renal disease: Secondary | ICD-10-CM | POA: Diagnosis not present

## 2020-04-12 DIAGNOSIS — R509 Fever, unspecified: Secondary | ICD-10-CM | POA: Diagnosis not present

## 2020-04-12 DIAGNOSIS — I70261 Atherosclerosis of native arteries of extremities with gangrene, right leg: Secondary | ICD-10-CM | POA: Diagnosis not present

## 2020-04-12 DIAGNOSIS — I70238 Atherosclerosis of native arteries of right leg with ulceration of other part of lower right leg: Secondary | ICD-10-CM | POA: Diagnosis not present

## 2020-04-12 DIAGNOSIS — Z89511 Acquired absence of right leg below knee: Secondary | ICD-10-CM | POA: Diagnosis not present

## 2020-04-12 DIAGNOSIS — I2510 Atherosclerotic heart disease of native coronary artery without angina pectoris: Secondary | ICD-10-CM | POA: Diagnosis not present

## 2020-04-12 DIAGNOSIS — Z4781 Encounter for orthopedic aftercare following surgical amputation: Secondary | ICD-10-CM | POA: Diagnosis not present

## 2020-04-12 DIAGNOSIS — I4821 Permanent atrial fibrillation: Secondary | ICD-10-CM | POA: Diagnosis present

## 2020-04-12 DIAGNOSIS — I739 Peripheral vascular disease, unspecified: Secondary | ICD-10-CM | POA: Diagnosis present

## 2020-04-12 DIAGNOSIS — Z992 Dependence on renal dialysis: Secondary | ICD-10-CM | POA: Diagnosis not present

## 2020-04-12 DIAGNOSIS — J984 Other disorders of lung: Secondary | ICD-10-CM

## 2020-04-12 DIAGNOSIS — E8770 Fluid overload, unspecified: Secondary | ICD-10-CM | POA: Diagnosis not present

## 2020-04-12 DIAGNOSIS — L97919 Non-pressure chronic ulcer of unspecified part of right lower leg with unspecified severity: Secondary | ICD-10-CM | POA: Diagnosis not present

## 2020-04-12 DIAGNOSIS — E559 Vitamin D deficiency, unspecified: Secondary | ICD-10-CM | POA: Diagnosis not present

## 2020-04-12 DIAGNOSIS — I214 Non-ST elevation (NSTEMI) myocardial infarction: Secondary | ICD-10-CM | POA: Diagnosis not present

## 2020-04-12 DIAGNOSIS — I4891 Unspecified atrial fibrillation: Secondary | ICD-10-CM | POA: Diagnosis not present

## 2020-04-12 DIAGNOSIS — Z955 Presence of coronary angioplasty implant and graft: Secondary | ICD-10-CM

## 2020-04-12 DIAGNOSIS — Z20822 Contact with and (suspected) exposure to covid-19: Secondary | ICD-10-CM | POA: Diagnosis not present

## 2020-04-12 DIAGNOSIS — Y830 Surgical operation with transplant of whole organ as the cause of abnormal reaction of the patient, or of later complication, without mention of misadventure at the time of the procedure: Secondary | ICD-10-CM | POA: Diagnosis present

## 2020-04-12 DIAGNOSIS — Z7952 Long term (current) use of systemic steroids: Secondary | ICD-10-CM

## 2020-04-12 LAB — CBC
HCT: 33.7 % — ABNORMAL LOW (ref 39.0–52.0)
Hemoglobin: 10.9 g/dL — ABNORMAL LOW (ref 13.0–17.0)
MCH: 35.9 pg — ABNORMAL HIGH (ref 26.0–34.0)
MCHC: 32.3 g/dL (ref 30.0–36.0)
MCV: 110.9 fL — ABNORMAL HIGH (ref 80.0–100.0)
Platelets: 143 10*3/uL — ABNORMAL LOW (ref 150–400)
RBC: 3.04 MIL/uL — ABNORMAL LOW (ref 4.22–5.81)
RDW: 15.1 % (ref 11.5–15.5)
WBC: 6.2 10*3/uL (ref 4.0–10.5)
nRBC: 0 % (ref 0.0–0.2)

## 2020-04-12 LAB — TYPE AND SCREEN
ABO/RH(D): O POS
Antibody Screen: NEGATIVE

## 2020-04-12 LAB — COMPREHENSIVE METABOLIC PANEL
ALT: 17 U/L (ref 0–44)
AST: 22 U/L (ref 15–41)
Albumin: 3 g/dL — ABNORMAL LOW (ref 3.5–5.0)
Alkaline Phosphatase: 133 U/L — ABNORMAL HIGH (ref 38–126)
Anion gap: 16 — ABNORMAL HIGH (ref 5–15)
BUN: 22 mg/dL (ref 8–23)
CO2: 30 mmol/L (ref 22–32)
Calcium: 9.5 mg/dL (ref 8.9–10.3)
Chloride: 94 mmol/L — ABNORMAL LOW (ref 98–111)
Creatinine, Ser: 7.25 mg/dL — ABNORMAL HIGH (ref 0.61–1.24)
GFR, Estimated: 8 mL/min — ABNORMAL LOW (ref >60)
Glucose, Bld: 133 mg/dL — ABNORMAL HIGH (ref 70–99)
Potassium: 3.2 mmol/L — ABNORMAL LOW (ref 3.5–5.1)
Sodium: 140 mmol/L (ref 135–145)
Total Bilirubin: 1.1 mg/dL (ref 0.3–1.2)
Total Protein: 7.1 g/dL (ref 6.5–8.1)

## 2020-04-12 LAB — PROTIME-INR
INR: 4.3 (ref 0.8–1.2)
Prothrombin Time: 39.7 seconds — ABNORMAL HIGH (ref 11.4–15.2)

## 2020-04-12 LAB — APTT: aPTT: 67 seconds — ABNORMAL HIGH (ref 24–36)

## 2020-04-12 LAB — RESP PANEL BY RT-PCR (FLU A&B, COVID) ARPGX2
Influenza A by PCR: NEGATIVE
Influenza B by PCR: NEGATIVE
SARS Coronavirus 2 by RT PCR: NEGATIVE

## 2020-04-12 MED ORDER — MORPHINE SULFATE (PF) 4 MG/ML IV SOLN
4.0000 mg | INTRAVENOUS | Status: DC | PRN
Start: 2020-04-12 — End: 2020-04-20
  Administered 2020-04-13 – 2020-04-16 (×8): 4 mg via INTRAVENOUS
  Filled 2020-04-12 (×8): qty 1

## 2020-04-12 MED ORDER — CHLORHEXIDINE GLUCONATE CLOTH 2 % EX PADS
6.0000 | MEDICATED_PAD | Freq: Once | CUTANEOUS | Status: AC
  Administered 2020-04-12: 6 via TOPICAL

## 2020-04-12 MED ORDER — CHLORHEXIDINE GLUCONATE CLOTH 2 % EX PADS
6.0000 | MEDICATED_PAD | Freq: Once | CUTANEOUS | Status: AC
  Administered 2020-04-13: 6 via TOPICAL

## 2020-04-12 MED ORDER — CEFAZOLIN SODIUM-DEXTROSE 2-4 GM/100ML-% IV SOLN
2.0000 g | INTRAVENOUS | Status: AC
  Administered 2020-04-14: 2 g via INTRAVENOUS
  Filled 2020-04-12 (×2): qty 100

## 2020-04-12 MED ORDER — SODIUM CHLORIDE 0.9 % IV SOLN: INTRAVENOUS | Status: DC

## 2020-04-12 MED ORDER — SODIUM CHLORIDE 0.9% IV SOLUTION: Freq: Once | INTRAVENOUS | Status: AC

## 2020-04-12 NOTE — Progress Notes (Signed)
Patient placement called to check on bed assignment. Kim stated bed should be coming available shortly and then they would call patient to come in for admission tonight.

## 2020-04-12 NOTE — Progress Notes (Signed)
Patient arrived to 4E-07, direct admit. CHG bath given and consent received for procedure scheduled for tomorrow. CCMD called. Trula Slade, MD called.

## 2020-04-13 LAB — RENAL FUNCTION PANEL
Albumin: 3 g/dL — ABNORMAL LOW (ref 3.5–5.0)
Anion gap: 16 — ABNORMAL HIGH (ref 5–15)
BUN: 29 mg/dL — ABNORMAL HIGH (ref 8–23)
CO2: 28 mmol/L (ref 22–32)
Calcium: 9.7 mg/dL (ref 8.9–10.3)
Chloride: 97 mmol/L — ABNORMAL LOW (ref 98–111)
Creatinine, Ser: 8.52 mg/dL — ABNORMAL HIGH (ref 0.61–1.24)
GFR, Estimated: 6 mL/min — ABNORMAL LOW (ref >60)
Glucose, Bld: 135 mg/dL — ABNORMAL HIGH (ref 70–99)
Phosphorus: 5.8 mg/dL — ABNORMAL HIGH (ref 2.5–4.6)
Potassium: 3.7 mmol/L (ref 3.5–5.1)
Sodium: 141 mmol/L (ref 135–145)

## 2020-04-13 LAB — CBC
HCT: 30.2 % — ABNORMAL LOW (ref 39.0–52.0)
Hemoglobin: 9.3 g/dL — ABNORMAL LOW (ref 13.0–17.0)
MCH: 34.3 pg — ABNORMAL HIGH (ref 26.0–34.0)
MCHC: 30.8 g/dL (ref 30.0–36.0)
MCV: 111.4 fL — ABNORMAL HIGH (ref 80.0–100.0)
Platelets: 122 10*3/uL — ABNORMAL LOW (ref 150–400)
RBC: 2.71 MIL/uL — ABNORMAL LOW (ref 4.22–5.81)
RDW: 15.1 % (ref 11.5–15.5)
WBC: 5.7 10*3/uL (ref 4.0–10.5)
nRBC: 0 % (ref 0.0–0.2)

## 2020-04-13 LAB — SURGICAL PCR SCREEN
MRSA, PCR: NEGATIVE
Staphylococcus aureus: NEGATIVE

## 2020-04-13 LAB — PROTIME-INR
INR: 2.6 — ABNORMAL HIGH (ref 0.8–1.2)
INR: 2 — ABNORMAL HIGH (ref 0.8–1.2)
Prothrombin Time: 27.2 seconds — ABNORMAL HIGH (ref 11.4–15.2)
Prothrombin Time: 22.1 seconds — ABNORMAL HIGH (ref 11.4–15.2)

## 2020-04-13 LAB — HEPATITIS B SURFACE ANTIGEN: Hepatitis B Surface Ag: NONREACTIVE

## 2020-04-13 MED ORDER — CHLORHEXIDINE GLUCONATE CLOTH 2 % EX PADS
6.0000 | MEDICATED_PAD | Freq: Every day | CUTANEOUS | Status: DC
  Administered 2020-04-14 – 2020-04-23 (×8): 6 via TOPICAL

## 2020-04-13 MED ORDER — METOPROLOL TARTRATE 50 MG PO TABS
75.0000 mg | ORAL_TABLET | Freq: Two times a day (BID) | ORAL | Status: DC
  Administered 2020-04-13 – 2020-04-23 (×19): 75 mg via ORAL
  Filled 2020-04-13 (×19): qty 2

## 2020-04-13 MED ORDER — HEPARIN SODIUM (PORCINE) 1000 UNIT/ML DIALYSIS: 1000.0000 [IU] | INTRAMUSCULAR | Status: DC | PRN

## 2020-04-13 MED ORDER — CALCITRIOL 0.25 MCG PO CAPS
1.0000 ug | ORAL_CAPSULE | ORAL | Status: DC
  Administered 2020-04-13 – 2020-04-23 (×4): 1 ug via ORAL
  Filled 2020-04-13 (×7): qty 4

## 2020-04-13 MED ORDER — SODIUM CHLORIDE 0.9 % IV SOLN: 100.0000 mL | INTRAVENOUS | Status: DC | PRN

## 2020-04-13 MED ORDER — LIDOCAINE-PRILOCAINE 2.5-2.5 % EX CREA: 1.0000 "application " | TOPICAL_CREAM | CUTANEOUS | Status: DC | PRN

## 2020-04-13 MED ORDER — SEVELAMER CARBONATE 800 MG PO TABS
3200.0000 mg | ORAL_TABLET | Freq: Three times a day (TID) | ORAL | Status: DC
  Administered 2020-04-13 – 2020-04-23 (×22): 3200 mg via ORAL
  Filled 2020-04-13 (×24): qty 4

## 2020-04-13 MED ORDER — EZETIMIBE 10 MG PO TABS
10.0000 mg | ORAL_TABLET | Freq: Every day | ORAL | Status: DC
  Administered 2020-04-13 – 2020-04-23 (×11): 10 mg via ORAL
  Filled 2020-04-13 (×12): qty 1

## 2020-04-13 MED ORDER — DARBEPOETIN ALFA 60 MCG/0.3ML IJ SOSY
60.0000 ug | PREFILLED_SYRINGE | INTRAMUSCULAR | Status: DC
  Filled 2020-04-13 (×2): qty 0.3

## 2020-04-13 MED ORDER — LAMOTRIGINE 25 MG PO TABS
50.0000 mg | ORAL_TABLET | Freq: Two times a day (BID) | ORAL | Status: DC
  Administered 2020-04-13 – 2020-04-23 (×20): 50 mg via ORAL
  Filled 2020-04-13 (×22): qty 2

## 2020-04-13 MED ORDER — HEPARIN (PORCINE) 25000 UT/250ML-% IV SOLN
1200.0000 [IU]/h | INTRAVENOUS | Status: DC
  Administered 2020-04-13: 900 [IU]/h via INTRAVENOUS
  Filled 2020-04-13: qty 250

## 2020-04-13 MED ORDER — PENTAFLUOROPROP-TETRAFLUOROETH EX AERO: 1.0000 "application " | INHALATION_SPRAY | CUTANEOUS | Status: DC | PRN

## 2020-04-13 MED ORDER — TRAZODONE HCL 50 MG PO TABS
25.0000 mg | ORAL_TABLET | Freq: Every day | ORAL | Status: DC
  Administered 2020-04-13 – 2020-04-23 (×10): 25 mg via ORAL
  Filled 2020-04-13 (×11): qty 1

## 2020-04-13 MED ORDER — CALCITRIOL 0.5 MCG PO CAPS
ORAL_CAPSULE | ORAL | Status: AC
  Filled 2020-04-13: qty 2

## 2020-04-13 MED ORDER — ROSUVASTATIN CALCIUM 20 MG PO TABS
40.0000 mg | ORAL_TABLET | Freq: Every day | ORAL | Status: DC
  Administered 2020-04-13 – 2020-04-23 (×11): 40 mg via ORAL
  Filled 2020-04-13 (×11): qty 2

## 2020-04-13 MED ORDER — PANTOPRAZOLE SODIUM 40 MG PO TBEC
40.0000 mg | DELAYED_RELEASE_TABLET | Freq: Every day | ORAL | Status: DC
  Administered 2020-04-13 – 2020-04-23 (×11): 40 mg via ORAL
  Filled 2020-04-13 (×12): qty 1

## 2020-04-13 MED ORDER — LIDOCAINE HCL (PF) 1 % IJ SOLN: 5.0000 mL | INTRAMUSCULAR | Status: DC | PRN

## 2020-04-13 NOTE — Progress Notes (Signed)
Patient to HD with transport  

## 2020-04-13 NOTE — Progress Notes (Signed)
   Nephrology was called for consult.  He has HD MWF.   Roxy Horseman PA-C

## 2020-04-13 NOTE — Anesthesia Preprocedure Evaluation (Addendum)
Anesthesia Evaluation  Patient identified by MRN, date of birth, ID band Patient awake    Reviewed: Allergy & Precautions, NPO status , Patient's Chart, lab work & pertinent test results, Unable to perform ROS - Chart review only  History of Anesthesia Complications Negative for: history of anesthetic complications  Airway Mallampati: II  TM Distance: >3 FB Neck ROM: Full    Dental  (+) Dental Advisory Given   Pulmonary sleep apnea ,    Pulmonary exam normal        Cardiovascular hypertension, + angina + CAD, + Past MI, + Cardiac Stents, + Peripheral Vascular Disease and +CHF  + dysrhythmias Atrial Fibrillation + Valvular Problems/Murmurs AS  Rhythm:Regular Rate:Tachycardia + Systolic murmurs Echo 04/930 1. Left ventricular ejection fraction, by estimation, is 45 to 50%. The left ventricle has mildly decreased function. The left ventricle demonstrates regional wall motion abnormalities (see scoring diagram/findings for description). There is moderate left ventricular hypertrophy. Left ventricular diastolic parameters are indeterminate. There is moderate hypokinesis of the left ventricular, basal-mid inferior wall and inferolateral wall.  2. Right ventricular systolic function is normal. The right ventricular size is normal. Tricuspid regurgitation signal is inadequate for assessing PA pressure.  3. Left atrial size was moderately dilated.  4. Right atrial size was mildly dilated.  5. The mitral valve is normal in structure. No evidence of mitral valve regurgitation. No evidence of mitral stenosis.  6. The aortic valve has been repaired/replaced. Aortic valve regurgitation is not visualized. No aortic stenosis is present. There is a St. Jude valve present in the aortic position. Aortic valve mean gradient measures 11.0 mmHg.   - s/p AVR   Neuro/Psych Depression    GI/Hepatic Neg liver ROS, GERD  Medicated,  Endo/Other   negative endocrine ROS  Renal/GU ESRF and DialysisRenal disease     Musculoskeletal  (+) Arthritis ,   Abdominal   Peds  Hematology  (+) Blood dyscrasia, anemia ,   Anesthesia Other Findings   Reproductive/Obstetrics                            Lab Results  Component Value Date   WBC 6.2 04/12/2020   HGB 10.9 (L) 04/12/2020   HCT 33.7 (L) 04/12/2020   MCV 110.9 (H) 04/12/2020   PLT 143 (L) 04/12/2020   Lab Results  Component Value Date   CREATININE 7.25 (H) 04/12/2020   BUN 22 04/12/2020   NA 140 04/12/2020   K 3.2 (L) 04/12/2020   CL 94 (L) 04/12/2020   CO2 30 04/12/2020   Lab Results  Component Value Date   INR 2.6 (H) 04/13/2020   INR 4.3 (HH) 04/12/2020   INR 6.7 (A) 04/11/2020   Echo: 1. The left ventricle has moderate-severely reduced systolic function, with an ejection fraction of 30-35%. The cavity size was normal. There is mildly increased left ventricular wall thickness. Left ventricular diastolic Doppler parameters are  indeterminate. Left ventrical global hypokinesis without regional wall motion abnormalities.  2. The right ventricle has mildly reduced systolic function. The cavity was mildly enlarged. There is no increase in right ventricular wall thickness.  3. Left atrial size was moderate to severely dilated.  4. A 81m St. Jude mechanical prosthesis valve is present in the aortic position. Procedure Date: 06/15/12 Normal aortic valve prosthesis.Mean gradient within normal range for prosthetic   Anesthesia Physical  Anesthesia Plan  ASA: IV  Anesthesia Plan: Regional and MAC   Post-op  Pain Management:    Induction: Intravenous  PONV Risk Score and Plan: 2 and Ondansetron, Propofol infusion, Treatment may vary due to age or medical condition and TIVA  Airway Management Planned: Natural Airway  Additional Equipment:   Intra-op Plan:   Post-operative Plan:   Informed Consent: I have reviewed the patients History  and Physical, chart, labs and discussed the procedure including the risks, benefits and alternatives for the proposed anesthesia with the patient or authorized representative who has indicated his/her understanding and acceptance.     Dental advisory given  Plan Discussed with: CRNA, Anesthesiologist and Surgeon  Anesthesia Plan Comments:        Anesthesia Quick Evaluation

## 2020-04-13 NOTE — Progress Notes (Addendum)
ANTICOAGULATION CONSULT NOTE - Initial Consult  Pharmacy Consult for heparin Indication: atrial fibrillation + mAVR  Allergies  Allergen Reactions  . Statins Other (See Comments)    Arthralgias (intolerance), Myalgias (intolerance)  Pt taking pravastatin   . Gabapentin     Drowsy   . Sertraline Rash    Patient Measurements:   Heparin Dosing Weight: 75kg  Vital Signs: Temp: 98.7 F (37.1 C) (01/07 0301) Temp Source: Oral (01/07 0301) BP: 160/98 (01/07 0301) Pulse Rate: 90 (01/07 0301)  Labs: Recent Labs    04/11/20 1438 04/12/20 2046 04/13/20 0330  HGB  --  10.9*  --   HCT  --  33.7*  --   PLT  --  143*  --   APTT  --  67*  --   LABPROT  --  39.7* 27.2*  INR 6.7* 4.3* 2.6*  CREATININE  --  7.25*  --     Estimated Creatinine Clearance: 10.8 mL/min (A) (by C-G formula based on SCr of 7.25 mg/dL (H)).   Medical History: Past Medical History:  Diagnosis Date  . Abnormal stress test 03/19/2018  . 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 Aortic stenosis    a. 2014 s/p mech AVR ([facility])-->chronic coumadin; b. 08/2018 Echo: Mild AS/AI. Nl fxn/gradient.  . Arthritis   . Atrial fibrillation ([place])   . CAD (coronary artery disease)    a. 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 02/2018 MV [facility]): large inf, infsept, inflat infarct w/ min rev, EF 30%; c. 06/2018 Cath: LM min irregs, LAD 60p, 95/30/39m, LCX min irregs, RCA 40p, 59m, 90d, RPAV 90, RPDA 95ost; d. 09/2018 CABGx3 (LIMA->LAD, VG->D1, VG->PDA).  . Chronic anticoagulation 06/2012   a. Coumadin in the setting of mech AVR and afib.  Marland Kitchen COVID-19 03/15/2019  . CPAP (continuous positive airway pressure) dependence   . Dialysis patient [facility]) 04/07/2006   Patient started HD around 1996,  had 1st transplant LLQ around 1998 until 2007 when they found renal cell cancer in transplant and both native kidneys, all were removed > went back on HD until 2nd transplant  RLQ in 2014 which lasted 3 yrs; it was embolized for suspected acute rejection. Is currently on HD MWF in [place] w/ [facility] nephrology.  . ESRD (end stage renal disease) ([place])    a. 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 1999 s/p Kidney transplant; c. 2014 Bilat nephrectomy (transplanted kidney resected) in the setting of renal cell carcinoma; d. PD until 11/2017-->HD.  Marland Kitchen Heart murmur   . HFrEF (heart failure with reduced ejection fraction) ([place])    a. 07/2016 Echo: EF 55-60%; b. 02/2018 MV: EF 30%; c. 04/2018 Echo: EF 35%; d. 08/2018 Echo: EF 30-35%. Glob HK w/o rwma. Mildly reduced RV fxn. Mod to sev dil LA. Nl AoV fxn.  Marland Kitchen Hx of colonoscopy    2009 by Dr. Laural Golden. Normal except for small submucosal lipoma. No evidence of polyps or colitis.  . Hyperlipidemia   . Hypertension   . Hypogonadism in male 07/25/2015  . Ischemic cardiomyopathy    a. 07/2016 Echo: EF 55-60%; b. 02/2018 MV: EF 30%; c. 04/2018 Echo: EF 35%; d. 08/2018 Echo: EF 30-35%.  . OSA (obstructive sleep apnea)    No CPAP  . Permanent atrial fibrillation (HCC)    a. CHA2DS2VASc = 3-->Coumadin (also mech AVR); b. 06/2018 Amio started 2/2 elevated rates.  . Renal  cell cancer ([place])   . Renal cell carcinoma ([place]) 08/04/2016     Assessment: 65 yoM admitted for elective BKA. Pt on warfarin PTA for hx mAVR and AFib (INR goal 2.5-3.5). INR on admit was elevated, now down to 2.6 s/p FFP x2. Pt to be started on IV heparin once INR is subtherapeutic. Current plan is to redose FFP this morning, recheck INR, and tentatively plan for surgery either later this afternoon or tomorrow pending INR trend.  Goal of Therapy:  Heparin level 0.3-0.7 units/ml Monitor platelets by anticoagulation protocol: Yes   Plan:  -Hold heparin for now -Check INR after FFP - if <2.5 will begin heparin bridge   ADDENDUM: FFP still infusing, then HD planned after so it is anticipated INR will not be drawn in a timely manner. Therefore, will go ahead and begin  heparin infusion to cover mechanical valve as INR likely <2.5 with 2 units of FFP administer (2.6 this am). Dr Carlis Abbott aware of plan. Will check INR this afternoon still when able. Will start heparin cautiously given INR unknown.  Plan: -Heparin 900 units/h -Check INR this afternoon when able -Heparin level in 8h   Arrie Senate, PharmD, [place], [facility] Clinical Pharmacist [phone] Please check AMION for all [place] numbers 04/13/2020

## 2020-04-13 NOTE — Progress Notes (Signed)
Mobility Specialist: Progress Note   04/13/20 1428  Mobility  Activity Contraindicated/medical hold   Instructed by RN to hold off on seeing pt today. Pt going to HD soon.   [facility] Mobility Specialist

## 2020-04-13 NOTE — Progress Notes (Signed)
Plan right BKA tomorrow morning given still awaiting second unit of FFP for INR 2.6.  Marty Heck, MD [facility] [place] Office: [address]

## 2020-04-13 NOTE — Consult Note (Addendum)
ESRD Consult Note  Requesting provider: Monica Martinez Service requesting consult: Vascular Reason for consult: ESRD, provision of dialysis Indication for acute dialysis?: End Stage Renal Disease  Outpatient dialysis unit: [facility] Outpatient dialysis schedule: MWF  Assessment/Recommendations: Jeremy [name] is a/an 65 y.o. male with a past medical history notable for ESRD on HD admitted for RLE amputation.   # ESRD: Planning for dialysis today to maintain MWF schedule. 3.75hrs, 2K, 2.25Ca, Na 137, Bicarbonate 35, Dialyzer: F180 # Volume/ hypertension: EDW 77.5kg. Attempt to achieve EDW as tolerated. Will need to adjust after amputation # Anemia of Chronic Kidney Disease: Hemoglobin 10.9. Currently receiving Epo 8000 units 3x weekly. Aranesp 81mcg starting Monday if inpatient.  # Secondary Hyperparathyroidism/Hyperphosphatemia: Home calcitriol 100mcg MWF # Vascular access: LUE AVF with no issues # Hospital problem: RLE ischemia. Planning for amputation today or tomorrow w/ vascular  # Additional recommendations: - Dose all meds for creatinine clearance < 10 ml/min  - Unless absolutely necessary, no MRIs with gadolinium.  - Implement save arm precautions.  Prefer needle sticks in the dorsum of the hands or wrists.  No blood pressure measurements in arm. - If blood transfusion is requested during hemodialysis sessions, please alert [name] prior to the session.   Recommendations were discussed with the primary team.   History of Present Illness: Jeremy [name] is a/an 65 y.o. male with a past medical history of ESRD who presents with plans for RLE amputation.  Patient is followed by vascular outpatient. He has had interventions on his right foot in the past attempting to limits amputation need. He has followed with podiatry and vascular but the foot has worsened over the past month. He has a lot of aching pain in his foot. 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 Coumadin has been  held and Attempting to improve INR before proceeding with surgery. Heparin bridge given h/o mechanical valve.  Last had dialysis Wednesday. No issues with dialysis recently. Denies fever, chills, SOB, and chest pain.   Medications:  Current Facility-Administered Medications  Medication Dose Route Frequency Provider Last Rate Last Admin  . 0.9 %  sodium chloride infusion   Intravenous Continuous Marty Heck, MD 10 mL/hr at 04/12/20 2317 New Bag at 04/12/20 2317  . calcitRIOL (ROCALTROL) capsule 1 mcg  1 mcg Oral Q M,W,F Reesa Chew, MD      . ceFAZolin (ANCEF) IVPB 2g/100 mL premix  2 g Intravenous 30 min Pre-Op Marty Heck, MD      . Derrill Memo ON 04/14/2020] Chlorhexidine Gluconate Cloth 2 % PADS 6 each  6 each Topical Q0600 Reesa Chew, MD      . Derrill Memo ON 04/16/2020] Darbepoetin Alfa (ARANESP) injection 60 mcg  60 mcg Intravenous Q Mon-HD Reesa Chew, MD      . ezetimibe (ZETIA) tablet 10 mg  10 mg Oral Daily Baglia, Corrina, PA-C      . lamoTRIgine (LAMICTAL) tablet 50 mg  50 mg Oral BID Baglia, Corrina, PA-C      . metoprolol tartrate (LOPRESSOR) tablet 75 mg  75 mg Oral BID Baglia, Corrina, PA-C   75 mg at 04/13/20 1056  . morphine 4 MG/ML injection 4 mg  4 mg Intravenous Q1H PRN Serafina Mitchell, MD   4 mg at 04/13/20 0937  . pantoprazole (PROTONIX) EC tablet 40 mg  40 mg Oral Daily Baglia, Corrina, PA-C      . rosuvastatin (CRESTOR) tablet 40 mg  40 mg  Oral q1800 Baglia, Corrina, PA-C      . sevelamer carbonate (RENVELA) tablet 3,200 mg  3,200 mg Oral TID WC Baglia, Corrina, PA-C      . traZODone (DESYREL) tablet 25 mg  25 mg Oral QHS Baglia, Corrina, PA-C         ALLERGIES Statins, Gabapentin, and Sertraline  MEDICAL HISTORY Past Medical History:  Diagnosis Date  . Abnormal stress test 03/19/2018  . 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 Aortic stenosis    a. 2014 s/p [facility]ch AVR ([facility])-->chronic coumadin; b. 08/2018 Echo: Mild AS/AI. Nl  fxn/gradient.  . Arthritis   . Atrial fibrillation ([place])   . CAD (coronary artery disease)    a. 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 02/2018 MV [facility]): large inf, infsept, inflat infarct w/ min rev, EF 30%; c. 06/2018 Cath: LM min irregs, LAD 60p, 95/30/76m, LCX min irregs, RCA 40p, 76m, 90d, RPAV 90, RPDA 95ost; d. 09/2018 CABGx3 (LIMA->LAD, VG->D1, VG->PDA).  . Chronic anticoagulation 06/2012   a. Coumadin in the setting of mech AVR and afib.  Marland Kitchen COVID-19 03/15/2019  . CPAP (continuous positive airway pressure) dependence   . Dialysis patient [facility]) 04/07/2006   Patient started HD around 1996,  had 1st transplant LLQ around 1998 until 2007 when they found renal cell cancer in transplant and both native kidneys, all were removed > went back on HD until 2nd transplant RLQ in 2014 which lasted 3 yrs; it was embolized for suspected acute rejection. Is currently on HD MWF in [place] w/ [facility] nephrology.  . ESRD (end stage renal disease) ([place])    a. 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 1999 s/p Kidney transplant; c. 2014 Bilat nephrectomy (transplanted kidney resected) in the setting of renal cell carcinoma; d. PD until 11/2017-->HD.  Marland Kitchen Heart murmur   . HFrEF (heart failure with reduced ejection fraction) ([place])    a. 07/2016 Echo: EF 55-60%; b. 02/2018 MV: EF 30%; c. 04/2018 Echo: EF 35%; d. 08/2018 Echo: EF 30-35%. Glob HK w/o rwma. Mildly reduced RV fxn. Mod to sev dil LA. Nl AoV fxn.  Marland Kitchen Hx of colonoscopy    2009 by Dr. Laural Golden. Normal except for small submucosal lipoma. No evidence of polyps or colitis.  . Hyperlipidemia   . Hypertension   . Hypogonadism in male 07/25/2015  . Ischemic cardiomyopathy    a. 07/2016 Echo: EF 55-60%; b. 02/2018 MV: EF 30%; c. 04/2018 Echo: EF 35%; d. 08/2018 Echo: EF 30-35%.  . OSA (obstructive sleep apnea)    No CPAP  . Permanent atrial fibrillation (HCC)    a. CHA2DS2VASc = 3-->Coumadin (also mech  AVR); b. 06/2018 Amio started 2/2 elevated rates.  . Renal cell cancer ([place])   . Renal cell carcinoma ([place]) 08/04/2016     SOCIAL HISTORY Social History   Socioeconomic History  . Marital status: Divorced    Spouse name: Not on file  . Number of children: 1  . Years of education: 26  . Highest education level: High school graduate  Occupational History  . Occupation: retired Forensic scientist -Disabled    Comment: Psychologist, educational  Tobacco Use  . Smoking status: Never Smoker  . Smokeless tobacco: Never Used  Vaping Use  . Vaping Use: Never used  Substance and Sexual Activity  . Alcohol use: Not Currently  . Drug use: No  . Sexual activity: Not Currently  Partners: Female  Other Topics Concern  . Not on file  Social History Narrative   Lives at home with girlfriend   1 daughter 50    Right handed    Social Determinants of Health   Financial Resource Strain: Not on file  Food Insecurity: Not on file  Transportation Needs: Not on file  Physical Activity: Not on file  Stress: Not on file  Social Connections: Not on file  Intimate Partner Violence: Not on file     FAMILY HISTORY Family History  Problem Relation Age of Onset  . Cancer Mother        pancreatic  . Heart disease Father   . Diabetes Father      Review of Systems: 12 systems were reviewed and negative except per HPI  Physical Exam: Vitals:   04/13/20 1051 04/13/20 1248  BP: (!) 142/79 136/90  Pulse: (!) 120 (!) 123  Resp: 17 19  Temp: 98.7 F (37.1 C) 98.3 F (36.8 C)  SpO2: 94% 96%   Total I/O In: 470 [Blood:470] Out: -   Intake/Output Summary (Last 24 hours) at 04/13/2020 1252 Last data filed at 04/13/2020 1248 Gross per 24 hour  Intake 889 ml  Output -  Net 889 ml   General: well-appearing, no acute distress HEENT: anicteric sclera, MMM CV: normal rate, no edema Lungs: bilateral chest rise, normal wob Abd: soft, non-tender, non-distended Skin: extensive erythema of foot and ankle with  necrosis of toes on right, otherwise no visible lesions or rashes Psych: alert, engaged, appropriate mood and affect Neuro: normal speech, no gross focal deficits   Test Results Reviewed Lab Results  Component Value Date   NA 140 04/12/2020   K 3.2 (L) 04/12/2020   CL 94 (L) 04/12/2020   CO2 30 04/12/2020   BUN 22 04/12/2020   CREATININE 7.25 (H) 04/12/2020   GLU 98 04/14/2019   CALCIUM 9.5 04/12/2020   ALBUMIN 3.0 (L) 04/12/2020   PHOS 5.0 (H) 12/23/2019    I have reviewed relevant outside healthcare records

## 2020-04-13 NOTE — Progress Notes (Signed)
Patient returned to 4E07 from HD. VSS.

## 2020-04-13 NOTE — H&P (Signed)
HPI: Jeremy [name] is a 65 y.o. male with multiple medical problems including atrial fibrillation on Coumadin, peripheral arterial disease, coronary artery disease status post CABG and subsequent PCI, aortic stenosis with mechanical aortic valve, ESRD, hypertension, hyperlipidemia thatpresents for 1 month follow-up and wound check of right foot ulcer. He was previously seen by Dr. Donnetta Hutching for a second opinion and since Dr. Donnetta Hutching moved to [place] I was asked to follow his care here in [place].   The right foot got worse and we subsequently intervened with a retrograde right PT intervention and angioplasty of the PT TP trunk on 12/22/2019.  He is keeping Santyl on the toe prescribed by his podiatrist.  The foot has gotten much worse since I last saw him 1 month ago.  He did some biofeedback therapy and saw a chiropractor in the interim.  States the right foot hurts a lot all the time.  He has a complex historyandhad an area of nonhealing ulceration in his right foot and on 08/25/2019 underwent arteriography via theleftfemoral [facility] at [facility]. He had angioplasty of the tibioperoneal trunk, peroneal and posterior tibial arteries on the right with drug-eluting balloon. 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 Apparently had complication of bleeding follow thisandwas taken to the [facility] lab and underwent Viabahn stenting of his deep femoral artery, superficial femoral artery and common femoral artery for control of bleeding at [place].       Past Medical History:  Diagnosis Date  . Abnormal stress test 03/19/2018  . 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 Aortic stenosis    a. 2014 s/p mech AVR ([facility])-->chronic coumadin; b. 08/2018 Echo: Mild AS/AI. Nl fxn/gradient.  . Arthritis   . Atrial fibrillation ([place])   . CAD (coronary artery disease)    a. 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 02/2018 MV [facility]): large inf, infsept, inflat infarct w/ min rev, EF 30%; c. 06/2018 Cath: LM min irregs, LAD 60p, 95/30/54m, LCX min irregs, RCA 40p, 33m, 90d, RPAV 90, RPDA 95ost; d. 09/2018 CABGx3 (LIMA->LAD, VG->D1, VG->PDA).  . Chronic anticoagulation 06/2012   a. Coumadin in the setting of mech AVR and afib.  Marland Kitchen COVID-19 03/15/2019  . CPAP (continuous positive airway pressure) dependence   . Dialysis patient [facility]) 04/07/2006   Patient started HD around 1996,  had 1st transplant LLQ around 1998 until 2007 when they found renal cell cancer in transplant and both native kidneys, all were removed > went back on HD until 2nd transplant RLQ in 2014 which lasted 3 yrs; it was embolized for suspected acute rejection. Is currently on HD MWF in [place] w/ [facility] nephrology.  . ESRD (end stage renal disease) ([place])    a. 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 1999 s/p Kidney transplant; c. 2014 Bilat nephrectomy (transplanted kidney resected) in the setting of renal cell carcinoma; d. PD until 11/2017-->HD.  Marland Kitchen Heart murmur   . HFrEF (heart failure with reduced ejection fraction) ([place])    a. 07/2016 Echo: EF 55-60%; b. 02/2018 MV: EF 30%; c. 04/2018 Echo: EF 35%; d. 08/2018 Echo: EF 30-35%. Glob HK w/o rwma. Mildly reduced RV fxn. Mod to sev dil LA. Nl AoV fxn.  Marland Kitchen Hx of colonoscopy    2009 by Dr. Laural Golden. Normal except for small submucosal lipoma. No evidence of polyps or colitis.  . Hyperlipidemia   .  Hypertension   . Hypogonadism in male 07/25/2015  . Ischemic cardiomyopathy    a. 07/2016 Echo: EF 55-60%; b. 02/2018 MV: EF 30%; c. 04/2018 Echo: EF 35%; d. 08/2018 Echo: EF 30-35%.  . OSA (obstructive sleep apnea)    No CPAP  . Permanent atrial fibrillation (HCC)    a. CHA2DS2VASc = 3-->Coumadin (also mech AVR); b. 06/2018 Amio started 2/2 elevated rates.  . Renal cell cancer ([place])   . Renal cell carcinoma ([place]) 08/04/2016         Past Surgical  History:  Procedure Laterality Date  . ABDOMINAL AORTOGRAM W/LOWER EXTREMITY N/A 12/22/2019   Procedure: ABDOMINAL AORTOGRAM W/LOWER EXTREMITY;  Surgeon: Marty Heck, MD;  Location: [place] CV LAB;  Service: Cardiovascular;  Laterality: N/A;  . AORTIC VALVE REPLACEMENT  3 /11 /2014   At [place]    . CAPD INSERTION N/A 02/19/2017   Procedure: LAPAROSCOPIC INSERTION CONTINUOUS AMBULATORY PERITONEAL DIALYSIS  (CAPD) CATHETER;  Surgeon: Algernon Huxley, MD;  Location: [facility] ORS;  Service: General;  Laterality: N/A;  . CORONARY ANGIOPLASTY WITH STENT PLACEMENT    . CORONARY ARTERY BYPASS GRAFT N/A 09/16/2018   Procedure: CORONARY ARTERY BYPASS GRAFT times three      with left internal mammary artery and endoharvest of right leg greater saphenous vein;  Surgeon: Melrose Nakayama, MD;  Location: [place];  Service: Open Heart Surgery;  Laterality: N/A;  . CORONARY STENT INTERVENTION N/A 07/15/2019   Procedure: CORONARY STENT INTERVENTION;  Surgeon: Wellington Hampshire, MD;  Location: [place] CV LAB;  Service: Cardiovascular;  Laterality: N/A;  . DG AV DIALYSIS  SHUNT ACCESS EXIST*L* OR     left arm  . EPIGASTRIC HERNIA REPAIR N/A 02/19/2017   Procedure: HERNIA REPAIR EPIGASTRIC ADULT;  Surgeon: Robert Bellow, MD;  Location: [facility] ORS;  Service: General;  Laterality: N/A;  . EYE SURGERY     bilateral cataract  . HERNIA REPAIR     UMBILICAL HERNIA  . HERNIA REPAIR  02/19/2017  . INNER EAR SURGERY     20 years ago  . KIDNEY TRANSPLANT    . LEFT HEART CATH AND CORS/GRAFTS ANGIOGRAPHY N/A 07/15/2019   Procedure: LEFT HEART CATH AND CORS/GRAFTS ANGIOGRAPHY;  Surgeon: Wellington Hampshire, MD;  Location: [place] CV LAB;  Service: Cardiovascular;  Laterality: N/A;  . LOWER EXTREMITY ANGIOGRAPHY Right 08/25/2019   Procedure: LOWER EXTREMITY ANGIOGRAPHY;  Surgeon: Algernon Huxley, MD;  Location: [place] CV LAB;   Service: Cardiovascular;  Laterality: Right;  . LOWER EXTREMITY ANGIOGRAPHY Left 09/29/2019   Procedure: Lower Extremity Angiography;  Surgeon: Algernon Huxley, MD;  Location: [place] CV LAB;  Service: Cardiovascular;  Laterality: Left;  . PERIPHERAL VASCULAR INTERVENTION Right 12/22/2019   Procedure: PERIPHERAL VASCULAR INTERVENTION;  Surgeon: Marty Heck, MD;  Location: [place] CV LAB;  Service: Cardiovascular;  Laterality: Right;  . Peritoneal Dialysis access placement  02/19/2017  . PSEUDOANERYSM COMPRESSION N/A 09/29/2019   Procedure: PSEUDOANERYSM COMPRESSION;  Surgeon: Algernon Huxley, MD;  Location: [place] CV LAB;  Service: Cardiovascular;  Laterality: N/A;  . REMOVAL OF A DIALYSIS CATHETER N/A 11/23/2017   Procedure: REMOVAL OF A PERITONEAL DIALYSIS CATHETER;  Surgeon: Algernon Huxley, MD;  Location: [facility];  Service: Vascular;  Laterality: N/A;  . RIGHT/LEFT HEART CATH AND CORONARY ANGIOGRAPHY Bilateral 06/07/2018   Procedure: RIGHT/LEFT HEART CATH AND CORONARY ANGIOGRAPHY;  Surgeon: Wellington Hampshire, MD;  Location: [place] [facility];  Service: Cardiovascular;  Laterality: Bilateral;  . TEE WITHOUT CARDIOVERSION N/A 07/21/2016   Procedure: TRANSESOPHAGEAL ECHOCARDIOGRAM (TEE);  Surgeon: Wellington Hampshire, MD;  Location: [facility] OR[facility];  Service: Cardiovascular;  Laterality: N/A;  . TEE WITHOUT CARDIOVERSION N/A 09/16/2018   Procedure: TRANSESOPHAGEAL ECHOCARDIOGRAM (TEE);  Surgeon: Melrose Nakayama, MD;  Location: [place];  Service: Open Heart Surgery;  Laterality: N/A;         Family History  Problem Relation Age of Onset  . Cancer Mother        pancreatic  . Heart disease Father   . Diabetes Father     SOCIAL HISTORY: Social History       Tobacco Use  . Smoking status: Never Smoker  . Smokeless tobacco: Never Used  Substance Use Topics  . Alcohol use: Not Currently         Allergies  Allergen Reactions  . Statins Other (See Comments)     Other reaction(s): Arthralgias (intolerance), Myalgias (intolerance)  Pt taking pravastatin   . Sertraline Rash          Current Outpatient Medications  Medication Sig Dispense Refill  . acetaminophen (TYLENOL) 500 MG tablet Take 1,000 mg 2 (two) times daily as needed by mouth for moderate pain.    Marland Kitchen allopurinol (ZYLOPRIM) 300 MG tablet Take 300 mg by mouth daily.  5  . azelastine (ASTELIN) 0.1 % nasal spray PLACE 1 SPRAY INTO BOTH NOSTRILS 2 (TWO) TIMES DAILY. USE IN EACH NOSTRIL AS DIRECTED 30 mL 3  . benzonatate (TESSALON) 200 MG capsule Take 1 capsule (200 mg total) by mouth 3 (three) times daily as needed for cough. 30 capsule 1  . cholecalciferol (VITAMIN D3) 25 MCG (1000 UNIT) tablet Take 1,000 Units by mouth daily.    . cinacalcet (SENSIPAR) 30 MG tablet Take 30 mg by mouth daily.     . clopidogrel (PLAVIX) 75 MG tablet Take 1 tablet (75 mg total) by mouth daily with breakfast. 90 tablet 3  . Epoetin Alfa-epbx (RETACRIT IJ) Epoetin alfa - epbx (Retacrit)    . ezetimibe (ZETIA) 10 MG tablet TAKE 1 TABLET BY MOUTH EVERY DAY 90 tablet 1  . gabapentin (NEURONTIN) 100 MG capsule Take by mouth.    . hydroxychloroquine (PLAQUENIL) 200 MG tablet Take 200 mg by mouth 2 (two) times daily.    . iron sucrose in sodium chloride 0.9 % 100 mL Iron Sucrose (Venofer)    . lamoTRIgine (LAMICTAL) 25 MG tablet Take 2 tablets (50 mg total) by mouth 2 (two) times daily. 120 tablet 5  . lidocaine-prilocaine (EMLA) cream Apply 1 application topically as needed (for fistula). Monday, Wednesday, Friday for dialysis    . LOPERAMIDE HCL PO Take by mouth.    . loratadine (CLARITIN) 10 MG tablet Take 10 mg by mouth daily.    . metoprolol tartrate 75 MG TABS Take 75 mg by mouth 2 (two) times daily. 180 tablet 3  . midodrine (PROAMATINE) 10 MG tablet Take 10 mg by mouth 3 (three) times a week. Mon, Wed, Friday    . multivitamin (RENA-VIT) TABS tablet Take 1 tablet by mouth daily.     Marland Kitchen oxyCODONE (OXY IR/ROXICODONE) 5 MG immediate release tablet Take 1 tablet (5 mg total) by mouth every 8 (eight) hours as needed for breakthrough pain. 30 tablet 0  . pantoprazole (PROTONIX) 40 MG tablet Take 1 tablet (40 mg total) by mouth daily. 90 tablet 3  . predniSONE (DELTASONE) 5  MG tablet Take 5 mg by mouth daily.    . rosuvastatin (CRESTOR) 40 MG tablet Take 1 tablet (40 mg total) by mouth daily at 6 PM. 90 tablet 3  . sevelamer carbonate (RENVELA) 800 MG tablet Take 3,200 mg by mouth 3 (three) times daily with meals. 4 tabs per meal    . silver sulfADIAZINE (SILVADENE) 1 % cream Apply 1 application topically daily. Apply to right great toe and heel    . traZODone (DESYREL) 50 MG tablet TAKE 1/2 -1 TABLET (25-50 MG TOTAL) BY MOUTH AT BEDTIME AS NEEDED FOR SLEEP. (Patient taking differently: Take 50 mg by mouth at bedtime as needed for sleep.) 90 tablet 2  . warfarin (COUMADIN) 1 MG tablet TAKE 1 MG DAILY ON EVERY DAY EXCEPT TAKE 0.5 MG ON FRIDAYS. *OR AS DIRECTED* 90 tablet 3  . zinc sulfate 220 (50 Zn) MG capsule Take 220 mg by mouth daily.    . nitroGLYCERIN (NITROSTAT) 0.4 MG SL tablet Place 1 tablet (0.4 mg total) under the tongue every 5 (five) minutes as needed for chest pain. 25 tablet 3            Current Facility-Administered Medications  Medication Dose Route Frequency Provider Last Rate Last Admin  . 0.9 %  sodium chloride infusion  250 mL Intravenous PRN Marty Heck, MD        REVIEW OF SYSTEMS:  [X]  denotes positive finding, [ ]  denotes negative finding Cardiac  Comments:  Chest pain or chest pressure:    Shortness of breath upon exertion:    Short of breath when lying flat:    Irregular heart rhythm:        Vascular    Pain in calf, thigh, or hip brought on by ambulation:    Pain in feet at night that wakes you up from your sleep:     Blood clot in your veins:    Leg swelling:         Pulmonary    Oxygen at  home:    Productive cough:     Wheezing:         Neurologic    Sudden weakness in arms or legs:     Sudden numbness in arms or legs:     Sudden onset of difficulty speaking or slurred speech:    Temporary loss of vision in one eye:     Problems with dizziness:         Gastrointestinal    Blood in stool:     Vomited blood:         Genitourinary    Burning when urinating:     Blood in urine:        Psychiatric    Major depression:         Hematologic    Bleeding problems:    Problems with blood clotting too easily:        Skin    Rashes or ulcers:        Constitutional    Fever or chills:      PHYSICAL EXAM:    Vitals:   04/10/20 1008  BP: 138/77  Pulse: (!) 113  Resp: 16  Temp: 98.2 F (36.8 C)  TempSrc: Temporal  SpO2: 98%  Weight: 166 lb (75.3 kg)  Height: 5\' 10"  (1.778 m)        GENERAL: The patient is a well-nourished male, in no acute distress. The vital signs are documented above. CARDIAC: There is a  regular rate and rhythm.  VASCULAR:  Tissue loss to all right 5 toes with significant dependent rubor in the right foot   DATA:   Duplex today does show dampened monophasic waveforms at the right ankle with evidence of CLI even after multiple previous tibial interventions  Assessment/Plan:  65 year old male status post right leg intervention with retrograde PT/TP trunk angioplasty for CLI with tissue loss on 12/22/19.  This was a second tibial intervention as noted above after a first tibial intervention at [facility] earlier this year.  Unfortunately his foot looks much worse over the last month.  I do not feel the foot is salvageable moving forward.  I recommended a right below-knee amputation and he should have a nice chance to heal this with triphasic flow down to the popliteal artery.  He is on Coumadin and his last INR was 10 yesterday.  I have instructed that  he hold his Coumadin tonight and tomorrow night.  I will preadmit him on Thursday for heparin bridge given mechanical heart valve.  Tentative plan for right BKA on Friday.  Marty Heck, MD [facility] [place] Office: [phone]

## 2020-04-13 NOTE — Progress Notes (Signed)
[facility] [place]  Subjective  -having a lot of pain in the right foot.  INR this morning is 2.6 after FFP last night   Objective (!) 160/98 90 98.7 F (37.1 C) (Oral) 20 98%  Intake/Output Summary (Last 24 hours) at 04/13/2020 0826 Last data filed at 04/13/2020 0154 Gross per 24 hour  Intake 419 ml  Output --  Net 419 ml    Ischemic changes to right foot  Laboratory Lab Results: Recent Labs    04/12/20 2046  WBC 6.2  HGB 10.9*  HCT 33.7*  PLT 143*   BMET Recent Labs    04/12/20 2046  NA 140  K 3.2*  CL 94*  CO2 30  GLUCOSE 133*  BUN 22  CREATININE 7.25*  CALCIUM 9.5    COAG Lab Results  Component Value Date   INR 2.6 (H) 04/13/2020   INR 4.3 (HH) 04/12/2020   INR 6.7 (A) 04/11/2020   No results found for: PTT  Assessment/Planning:  65 year old male with multiple medical problems including ESRD on HD, atrial fibrillation, mechanical aortic valve on Coumadin that presents for planned right BKA for CLI with tissue loss after multiple previous revascularizations.  INR this morning is 2.6.  We will have to delay his surgery until we can get his INR corrected more.  I have ordered 2 additional units of FFP this morning and will check an INR after this.  Pharmacy has been consulted and they will start heparin when INR is less than 2.5.  We will have to call nephrology today for his ESRD.  Patient states he is due for dialysis today.  If we cannot get his surgery done today due to his INR I will probably do him tomorrow.  Marty Heck 04/13/2020 8:26 AM --

## 2020-04-14 ENCOUNTER — Encounter (HOSPITAL_COMMUNITY)
Admission: RE | Disposition: A | Discharge status: Rehabilitation Facility | Payer: Self-pay | Source: Home / Self Care | Attending: Vascular Surgery

## 2020-04-14 ENCOUNTER — Inpatient Hospital Stay (HOSPITAL_COMMUNITY): Payer: Medicare Other | Admitting: Anesthesiology

## 2020-04-14 DIAGNOSIS — I70229 Atherosclerosis of native arteries of extremities with rest pain, unspecified extremity: Secondary | ICD-10-CM | POA: Diagnosis present

## 2020-04-14 DIAGNOSIS — I70261 Atherosclerosis of native arteries of extremities with gangrene, right leg: Secondary | ICD-10-CM | POA: Diagnosis not present

## 2020-04-14 HISTORY — PX: AMPUTATION: SHX166

## 2020-04-14 LAB — PREPARE FRESH FROZEN PLASMA: Unit division: 0

## 2020-04-14 LAB — RENAL FUNCTION PANEL
Albumin: 2.8 g/dL — ABNORMAL LOW (ref 3.5–5.0)
Anion gap: 13 (ref 5–15)
BUN: 13 mg/dL (ref 8–23)
CO2: 30 mmol/L (ref 22–32)
Calcium: 9.3 mg/dL (ref 8.9–10.3)
Chloride: 97 mmol/L — ABNORMAL LOW (ref 98–111)
Creatinine, Ser: 4.71 mg/dL — ABNORMAL HIGH (ref 0.61–1.24)
GFR, Estimated: 13 mL/min — ABNORMAL LOW (ref >60)
Glucose, Bld: 101 mg/dL — ABNORMAL HIGH (ref 70–99)
Phosphorus: 3.9 mg/dL (ref 2.5–4.6)
Potassium: 3.4 mmol/L — ABNORMAL LOW (ref 3.5–5.1)
Sodium: 140 mmol/L (ref 135–145)

## 2020-04-14 LAB — BPAM FFP
Blood Product Expiration Date: 202201062359
Blood Product Expiration Date: 202201082359
Blood Product Expiration Date: 202201092359
Blood Product Expiration Date: 202201092359
ISSUE DATE / TIME: 202201062250
ISSUE DATE / TIME: 202201070142
ISSUE DATE / TIME: 202201071024
ISSUE DATE / TIME: 202201071259
Unit Type and Rh: 5100
Unit Type and Rh: 7300
Unit Type and Rh: 6200
Unit Type and Rh: 6200

## 2020-04-14 LAB — PROTIME-INR
INR: 2 — ABNORMAL HIGH (ref 0.8–1.2)
INR: 1.9 — ABNORMAL HIGH (ref 0.8–1.2)
INR: 1.7 — ABNORMAL HIGH (ref 0.8–1.2)
Prothrombin Time: 21.6 seconds — ABNORMAL HIGH (ref 11.4–15.2)
Prothrombin Time: 21.2 seconds — ABNORMAL HIGH (ref 11.4–15.2)
Prothrombin Time: 19.2 seconds — ABNORMAL HIGH (ref 11.4–15.2)

## 2020-04-14 LAB — HEPARIN LEVEL (UNFRACTIONATED): Heparin Unfractionated: <0.1 IU/mL — ABNORMAL LOW (ref 0.30–0.70)

## 2020-04-14 SURGERY — AMPUTATION BELOW KNEE
Anesthesia: Monitor Anesthesia Care | Site: Knee | Laterality: Right

## 2020-04-14 MED ORDER — CHLORHEXIDINE GLUCONATE 0.12 % MT SOLN: 15.0000 mL | Freq: Once | OROMUCOSAL | Status: AC

## 2020-04-14 MED ORDER — FENTANYL CITRATE (PF) 250 MCG/5ML IJ SOLN
INTRAMUSCULAR | Status: AC
  Filled 2020-04-14: qty 5

## 2020-04-14 MED ORDER — BUPIVACAINE LIPOSOME 1.3 % IJ SUSP
INTRAMUSCULAR | Status: DC | PRN
  Administered 2020-04-14: 10 mL via PERINEURAL

## 2020-04-14 MED ORDER — MIDAZOLAM HCL 2 MG/2ML IJ SOLN
INTRAMUSCULAR | Status: AC
  Filled 2020-04-14: qty 2

## 2020-04-14 MED ORDER — MIDAZOLAM HCL 2 MG/2ML IJ SOLN
INTRAMUSCULAR | Status: DC | PRN
  Administered 2020-04-14: 2 mg via INTRAVENOUS

## 2020-04-14 MED ORDER — FENTANYL CITRATE (PF) 250 MCG/5ML IJ SOLN
INTRAMUSCULAR | Status: DC | PRN
  Administered 2020-04-14 (×2): 25 ug via INTRAVENOUS
  Administered 2020-04-14: 50 ug via INTRAVENOUS

## 2020-04-14 MED ORDER — PHENYLEPHRINE 40 MCG/ML (10ML) SYRINGE FOR IV PUSH (FOR BLOOD PRESSURE SUPPORT)
PREFILLED_SYRINGE | INTRAVENOUS | Status: DC | PRN
  Administered 2020-04-14: 40 ug via INTRAVENOUS

## 2020-04-14 MED ORDER — ACETAMINOPHEN 500 MG PO TABS: 1000.0000 mg | ORAL_TABLET | Freq: Once | ORAL | Status: AC

## 2020-04-14 MED ORDER — CHLORHEXIDINE GLUCONATE 0.12 % MT SOLN
OROMUCOSAL | Status: AC
  Administered 2020-04-14: 15 mL via OROMUCOSAL
  Filled 2020-04-14: qty 15

## 2020-04-14 MED ORDER — ACETAMINOPHEN 500 MG PO TABS
ORAL_TABLET | ORAL | Status: AC
  Administered 2020-04-14: 1000 mg via ORAL
  Filled 2020-04-14: qty 2

## 2020-04-14 MED ORDER — PROPOFOL 10 MG/ML IV BOLUS
INTRAVENOUS | Status: DC | PRN
  Administered 2020-04-14: 30 mg via INTRAVENOUS

## 2020-04-14 MED ORDER — HEPARIN (PORCINE) 25000 UT/250ML-% IV SOLN
1800.0000 [IU]/h | INTRAVENOUS | Status: DC
  Administered 2020-04-14: 1200 [IU]/h via INTRAVENOUS
  Administered 2020-04-15: 1400 [IU]/h via INTRAVENOUS
  Administered 2020-04-16: 1800 [IU]/h via INTRAVENOUS
  Filled 2020-04-14 (×5): qty 250

## 2020-04-14 MED ORDER — ONDANSETRON HCL 4 MG/2ML IJ SOLN
INTRAMUSCULAR | Status: DC | PRN
  Administered 2020-04-14: 4 mg via INTRAVENOUS

## 2020-04-14 MED ORDER — 0.9 % SODIUM CHLORIDE (POUR BTL) OPTIME
TOPICAL | Status: DC | PRN
  Administered 2020-04-14: 3000 mL

## 2020-04-14 MED ORDER — PROPOFOL 1000 MG/100ML IV EMUL
INTRAVENOUS | Status: AC
  Filled 2020-04-14: qty 100

## 2020-04-14 MED ORDER — BUPIVACAINE HCL (PF) 0.5 % IJ SOLN
INTRAMUSCULAR | Status: DC | PRN
  Administered 2020-04-14: 15 mL via PERINEURAL

## 2020-04-14 MED ORDER — DROPERIDOL 2.5 MG/ML IJ SOLN: 0.6250 mg | Freq: Once | INTRAMUSCULAR | Status: DC | PRN

## 2020-04-14 MED ORDER — OXYCODONE-ACETAMINOPHEN 5-325 MG PO TABS
1.0000 | ORAL_TABLET | ORAL | Status: DC | PRN
  Administered 2020-04-14 – 2020-04-18 (×12): 2 via ORAL
  Administered 2020-04-20: 1 via ORAL
  Administered 2020-04-21 – 2020-04-23 (×5): 2 via ORAL
  Filled 2020-04-14 (×2): qty 2
  Filled 2020-04-14: qty 1
  Filled 2020-04-14 (×16): qty 2

## 2020-04-14 MED ORDER — PROPOFOL 500 MG/50ML IV EMUL
INTRAVENOUS | Status: DC | PRN
  Administered 2020-04-14: 50 ug/kg/min via INTRAVENOUS

## 2020-04-14 MED ORDER — FENTANYL CITRATE (PF) 100 MCG/2ML IJ SOLN: 25.0000 ug | INTRAMUSCULAR | Status: DC | PRN

## 2020-04-14 MED ORDER — ONDANSETRON HCL 4 MG/2ML IJ SOLN
INTRAMUSCULAR | Status: AC
  Filled 2020-04-14: qty 2

## 2020-04-14 SURGICAL SUPPLY — 56 items
BANDAGE ESMARK 6X9 LF (GAUZE/BANDAGES/DRESSINGS) ×1 IMPLANT
BLADE SAW RECIP 87.9 MT (BLADE) ×2 IMPLANT
BLADE SAW SAG 73X25 THK (BLADE) ×1
BLADE SAW SGTL 73X25 THK (BLADE) ×1 IMPLANT
BNDG CMPR 9X6 STRL LF SNTH (GAUZE/BANDAGES/DRESSINGS) ×1
BNDG COHESIVE 6X5 TAN STRL LF (GAUZE/BANDAGES/DRESSINGS) ×2 IMPLANT
BNDG ELASTIC 4X5.8 VLCR STR LF (GAUZE/BANDAGES/DRESSINGS) ×2 IMPLANT
BNDG ELASTIC 6X5.8 VLCR STR LF (GAUZE/BANDAGES/DRESSINGS) ×2 IMPLANT
BNDG ESMARK 6X9 LF (GAUZE/BANDAGES/DRESSINGS) ×2
BNDG GAUZE ELAST 4 BULKY (GAUZE/BANDAGES/DRESSINGS) ×4 IMPLANT
CANISTER SUCT 3000ML PPV (MISCELLANEOUS) ×2 IMPLANT
CLIP VESOCCLUDE MED 6/CT (CLIP) ×2 IMPLANT
COVER BACK TABLE 60X90IN (DRAPES) IMPLANT
COVER SURGICAL LIGHT HANDLE (MISCELLANEOUS) ×2 IMPLANT
COVER WAND RF STERILE (DRAPES) ×2 IMPLANT
DRAIN CHANNEL 19F RND (DRAIN) IMPLANT
DRAPE HALF SHEET 40X57 (DRAPES) ×2 IMPLANT
DRAPE ORTHO SPLIT 77X108 STRL (DRAPES) ×4
DRAPE SURG ORHT 6 SPLT 77X108 (DRAPES) ×2 IMPLANT
DRSG ADAPTIC 3X8 NADH LF (GAUZE/BANDAGES/DRESSINGS) ×2 IMPLANT
ELECT REM PT RETURN 9FT ADLT (ELECTROSURGICAL) ×2
ELECTRODE REM PT RTRN 9FT ADLT (ELECTROSURGICAL) ×1 IMPLANT
EVACUATOR SILICONE 100CC (DRAIN) IMPLANT
GAUZE SPONGE 4X4 12PLY STRL (GAUZE/BANDAGES/DRESSINGS) ×4 IMPLANT
GAUZE SPONGE 4X4 12PLY STRL LF (GAUZE/BANDAGES/DRESSINGS) ×4 IMPLANT
GLOVE BIO SURGEON STRL SZ7.5 (GLOVE) ×2 IMPLANT
GLOVE BIOGEL PI IND STRL 6 (GLOVE) ×1 IMPLANT
GLOVE BIOGEL PI IND STRL 7.5 (GLOVE) ×2 IMPLANT
GLOVE BIOGEL PI IND STRL 8 (GLOVE) ×1 IMPLANT
GLOVE BIOGEL PI INDICATOR 6 (GLOVE) ×1
GLOVE BIOGEL PI INDICATOR 7.5 (GLOVE) ×2
GLOVE BIOGEL PI INDICATOR 8 (GLOVE) ×1
GLOVE ECLIPSE 7.0 STRL STRAW (GLOVE) ×2 IMPLANT
GLOVE SURG SS PI 7.0 STRL IVOR (GLOVE) ×2 IMPLANT
GOWN STRL REUS W/ TWL LRG LVL3 (GOWN DISPOSABLE) ×2 IMPLANT
GOWN STRL REUS W/ TWL XL LVL3 (GOWN DISPOSABLE) ×1 IMPLANT
GOWN STRL REUS W/TWL LRG LVL3 (GOWN DISPOSABLE) ×4
GOWN STRL REUS W/TWL XL LVL3 (GOWN DISPOSABLE) ×2
KIT BASIN OR (CUSTOM PROCEDURE TRAY) ×2 IMPLANT
KIT TURNOVER KIT B (KITS) ×2 IMPLANT
NS IRRIG 1000ML POUR BTL (IV SOLUTION) ×2 IMPLANT
PACK GENERAL/GYN (CUSTOM PROCEDURE TRAY) ×2 IMPLANT
PAD ARMBOARD 7.5X6 YLW CONV (MISCELLANEOUS) ×4 IMPLANT
STAPLER VISISTAT 35W (STAPLE) ×2 IMPLANT
STOCKINETTE IMPERVIOUS LG (DRAPES) ×2 IMPLANT
SUT ETHILON 3 0 PS 1 (SUTURE) IMPLANT
SUT SILK 2 0 (SUTURE) ×2
SUT SILK 2 0 SH CR/8 (SUTURE) ×2 IMPLANT
SUT SILK 2-0 18XBRD TIE 12 (SUTURE) ×1 IMPLANT
SUT SILK 3 0 (SUTURE) ×2
SUT SILK 3-0 18XBRD TIE 12 (SUTURE) ×1 IMPLANT
SUT VIC AB 2-0 CT1 18 (SUTURE) ×6 IMPLANT
SUT VIC AB 3-0 SH 18 (SUTURE) IMPLANT
TOWEL GREEN STERILE (TOWEL DISPOSABLE) ×4 IMPLANT
UNDERPAD 30X36 HEAVY ABSORB (UNDERPADS AND DIAPERS) ×2 IMPLANT
WATER STERILE IRR 1000ML POUR (IV SOLUTION) ×2 IMPLANT

## 2020-04-14 NOTE — Progress Notes (Signed)
Nephrology Follow-Up Consult note   Assessment/Recommendations: Jeremy [name] is a/an 65 y.o. male with a past medical history significant for ESRD on HD admitted for RLE amputation.   # ESRD: MWF schedule. 3.75hrs, 2K, 2.25Ca, Na 137, Bicarbonate 35, Dialyzer: F180. Dialysis again on Monday # Volume/ hypertension: EDW 77.5kg but has been under EDW outpatient. Challenged slightly yesterday. Continue to challenge and establish new EDW after BKA # Anemia of Chronic Kidney Disease: Hemoglobin 9.3. Currently receiving Epo 8000 units 3x weekly. Aranesp 75mcg starting Monday if inpatient.  # Secondary Hyperparathyroidism/Hyperphosphatemia: Home calcitriol 28mcg MWF # Vascular access: LUE AVF with no issues # Hospital problem: RLE ischemia. S/p BKA on 1/8 doing well  # Additional recommendations: - Dose all meds for creatinine clearance <10 ml/min  - Unless absolutely necessary, no MRIs with gadolinium.  - Implement save arm precautions. Prefer needle sticks in the dorsum of the hands or wrists. No blood pressure measurements in arm. - If blood transfusion is requested during hemodialysis sessions, please alert [name] prior to the session.   Recommendations were discussed with the primary team.  Recommendations conveyed to primary service.    [facility] 04/14/2020 1:04 PM  ___________________________________________________________  CC: ESRD  Interval History/Subjective: Underwent RLE BKA this morning. Feels okay at this time. Minimal pain. Tolerated dialysis without significant issues.   Medications:  Current Facility-Administered Medications  Medication Dose Route Frequency Provider Last Rate Last Admin  . calcitRIOL (ROCALTROL) capsule 1 mcg  1 mcg Oral Q M,W,F Ulyses Amor, PA-C   1 mcg at 04/13/20 1810  . Chlorhexidine Gluconate Cloth 2 % PADS 6 each  6 each Topical Q0600 Ulyses Amor, PA-C   6 each at 04/14/20 0555  . [START ON 04/16/2020]  Darbepoetin Alfa (ARANESP) injection 60 mcg  60 mcg Intravenous Q Mon-HD Collins, [name] M, PA-C      . ezetimibe (ZETIA) tablet 10 mg  10 mg Oral Daily Laurence Slate M, PA-C   10 mg at 04/13/20 1957  . heparin ADULT infusion 100 units/mL (25000 units/219mL)  1,200 Units/hr Intravenous Continuous Laurence Slate M, PA-C      . lamoTRIgine (LAMICTAL) tablet 50 mg  50 mg Oral BID Laurence Slate M, PA-C   50 mg at 04/13/20 2131  . metoprolol tartrate (LOPRESSOR) tablet 75 mg  75 mg Oral BID Laurence Slate M, PA-C   75 mg at 04/14/20 0715  . morphine 4 MG/ML injection 4 mg  4 mg Intravenous Q1H PRN Laurence Slate M, PA-C   4 mg at 04/14/20 1030  . oxyCODONE-acetaminophen (PERCOCET/ROXICET) 5-325 MG per tablet 1-2 tablet  1-2 tablet Oral Q4H PRN Laurence Slate M, PA-C      . pantoprazole (PROTONIX) EC tablet 40 mg  40 mg Oral Daily Laurence Slate M, PA-C   40 mg at 04/13/20 1957  . rosuvastatin (CRESTOR) tablet 40 mg  40 mg Oral q1800 Ulyses Amor, PA-C   40 mg at 04/13/20 1958  . sevelamer carbonate (RENVELA) tablet 3,200 mg  3,200 mg Oral TID WC Ulyses Amor, PA-C   3,200 mg at 04/13/20 1957  . traZODone (DESYREL) tablet 25 mg  25 mg Oral QHS Laurence Slate M, PA-C   25 mg at 04/13/20 2131      Review of Systems: 10 systems reviewed and negative except per interval history/subjective  Physical Exam: Vitals:   04/14/20 1011 04/14/20 1045  BP: 125/66 116/63  Pulse: 84 74  Resp: 15 12  Temp: 97.9 F (36.6 C)   SpO2: 100% 100%   Total I/O In: 300 [I.V.:300] Out: 150 [Blood:150]  Intake/Output Summary (Last 24 hours) at 04/14/2020 1304 Last data filed at 04/14/2020 1000 Gross per 24 hour  Intake 2862.5 ml  Output 1150 ml  Net 1712.5 ml   Constitutional: well-appearing, no acute distress ENMT: ears and nose without scars or lesions, MMM CV: normal rate, no edema in lle Respiratory: bilateral chest rise, normal work of breathing Gastrointestinal: soft, non-tender, no palpable masses or  hernias Skin: no visible lesions or rashes, RLE wound covered in dressing Ext: RLE absent below the knee Psych: alert, judgement/insight appropriate, appropriate mood and affect   Test Results I personally reviewed new and old clinical labs and radiology tests Lab Results  Component Value Date   NA 140 04/14/2020   K 3.4 (L) 04/14/2020   CL 97 (L) 04/14/2020   CO2 30 04/14/2020   BUN 13 04/14/2020   CREATININE 4.71 (H) 04/14/2020   GLU 98 04/14/2019   CALCIUM 9.3 04/14/2020   ALBUMIN 2.8 (L) 04/14/2020   PHOS 3.9 04/14/2020

## 2020-04-14 NOTE — Progress Notes (Signed)
ANTICOAGULATION CONSULT NOTE - Follow Up Consult  Pharmacy Consult for heparin Indication: Afib/AVR  Labs: Recent Labs    04/12/20 2046 04/13/20 0330 04/13/20 1333 04/13/20 1429 04/13/20 2315  HGB 10.9*  --  9.3*  --   --   HCT 33.7*  --  30.2*  --   --   PLT 143*  --  122*  --   --   APTT 67*  --   --   --   --   LABPROT 39.7* 27.2*  --  22.1* 21.6*  INR 4.3* 2.6*  --  2.0* 2.0*  HEPARINUNFRC  --   --   --   --  <0.10*  CREATININE 7.25*  --  8.52*  --   --     Assessment: 64yo male subtherapeutic on heparin with initial dosing while INR being reversed for surgery; no gtt issues or signs of bleeding per RN.  Of note INR still 2; spoke w/ Dr Carlis Abbott who is ordering another unit of FFP.  Goal of Therapy:  Heparin level 0.3-0.7 units/ml   Plan:  Will increase heparin gtt by 4 units/kg/hr to 1200 units/hr and check level in 8 hours.    Wynona Neat, PharmD, BCPS  04/14/2020,12:47 AM

## 2020-04-14 NOTE — Progress Notes (Signed)
[facility] [place]  Subjective  - right foot pain ongoing.   Objective (!) 152/84 92 98.6 F (37 C) (Oral) 16 100%  Intake/Output Summary (Last 24 hours) at 04/14/2020 0740 Last data filed at 04/14/2020 0646 Gross per 24 hour  Intake 3032.5 ml  Output 1000 ml  Net 2032.5 ml    Right foot gangrene to toes 1-5  Laboratory Lab Results: Recent Labs    04/12/20 2046 04/13/20 1333  WBC 6.2 5.7  HGB 10.9* 9.3*  HCT 33.7* 30.2*  PLT 143* 122*   BMET Recent Labs    04/13/20 1333 04/14/20 0125  NA 141 140  K 3.7 3.4*  CL 97* 97*  CO2 28 30  GLUCOSE 135* 101*  BUN 29* 13  CREATININE 8.52* 4.71*  CALCIUM 9.7 9.3    COAG Lab Results  Component Value Date   INR 1.9 (H) 04/14/2020   INR 2.0 (H) 04/13/2020   INR 2.0 (H) 04/13/2020   No results found for: PTT  Assessment/Planning:  Plan right BKA for CLI with tissue loss.  Risks and benefits discussed. INR corrected from 10 on Tuesday to 1.9 this am.  Additional unit of FFP given now.  Will proceed with surgery.  Nephrology consulted yesterday for ESRD.  Will need heparin drip post-op for bridge with mechanical aortic valve.  Jeremy [name] 04/14/2020 7:40 AM --

## 2020-04-14 NOTE — Op Note (Addendum)
Date: April 14, 2020  PROCEDURE: right below-the-knee amputation  PRE-OPERATIVE DIAGNOSIS: right foot gangrene with critical limb ischemia  POST-OPERATIVE DIAGNOSIS: same as above  SURGEON: Marty Heck, MD  ASSISTANT(S): Roxy Horseman, PA  ANESTHESIA: MAC  ESTIMATED BLOOD LOSS: <150 mL  FINDING(S): Right below the knee amputation with viable tissue margins.  All the tibial vessels were heavily calcified.  SPECIMEN(S):  right below-the-knee amputation  INDICATIONS:   65 year old male who presented with critical limb ischemia of the right lower extremity and has undergone multiple right lower extremity tibial interventions both here and previously at [facility] including retrograde tibial interventions.  Ultimately his wounds have failed to heal and he has had progressive tissue loss with pain.  The limb is no longer felt to be salvageable.  He scheduled for a right below-the-knee amputation.  I discussed in depth with the patient the risks, benefits, and alternatives to this procedure.  The patient is aware that the risk of this operation included but are not limited to:  bleeding, infection, myocardial infarction, stroke, death, failure to heal amputation wound, and possible need for more proximal amputation.  The patient is aware of the risks and agrees proceed forward with the procedure.  An assistant was needed for exposure and to expedite the case.   DESCRIPTION:  After full informed written consent was obtained from the patient, the patient was brought back to the operating room, and placed supine upon the operating table.  Prior to induction, the patient received IV antibiotics.  The patient was then prepped and draped in the standard fashion for a below-the-knee amputation.  I placed a non-sterile tourniquet on the thigh prior to the procedure.  After obtaining adequate anesthesia, the patient was prepped and draped in the standard fashion for a below-the-knee  amputation.  I marked out the anterior incision distal to the tibial tuberosity that was two thirds the circumference of the calf and then the marked out a posterior flap that was one third of the circumference of the calf in length.   I then exsanguinated the leg with a Esmarch bandage and then inflated the tourniquet to 250 mm Hg.   I made the incisions for these flaps, and then dissected through the subcutaneous tissue, fascia, and muscle anteriorly with electrocautery.  I also similarly developed a thick posterior flap of muscle with electrocautery.  I elevated  the periosteal tissue superiorly so that the tibia was shorter than the anterior skin flap.  I then transected the tibia with a power saw.  Then I smoothed out the rough edges with a rasp.  In a similar fashion, I cut back the fibula higher than the level of the tibia with a bone cutter.  I then finished releasing the posterior muscle flap with electrocautery.  The tibial vessels were all clamped with hemostats.  These were all suture ligated with 2-0 silk.  At this point, the specimen was passed off the field as the below-the-knee amputation.  At this point, I clamped all visibly bleeding arteries and veins using a combination of suture ligation and electrocautery.  The tourniquet was then deflated at this point.  Bleeding continued to be controlled with electrocautery and suture ligature.  The stump was washed off with sterile normal saline and no further active bleeding was noted.    I reapproximated the anterior and posterior fascia  with interrupted stitches of 2-0 Vicryl.  This was completed along the entire length of anterior and posterior fascia until there were  no more loose space in the fascial line.  The skin was then reapproximated with staples.  The stump was washed off and dried.    The incision was dressed with [employer] and  then fluffs were applied.  Kerlix was wrapped around the leg and then gently an ACE wrap was applied.      COMPLICATIONS: None  CONDITION: Stable  Marty Heck, MD [facility] [place] Office: [place]

## 2020-04-14 NOTE — Anesthesia Procedure Notes (Signed)
Anesthesia Regional Block: Popliteal block   Pre-Anesthetic Checklist: ,, timeout performed, Correct Patient, Correct Site, Correct Laterality, Correct Procedure, Correct Position, site marked, Risks and benefits discussed,  Surgical consent,  Pre-op evaluation,  At surgeon's request and post-op pain management  Laterality: Right  Prep: chloraprep       Needles:  Injection technique: Single-shot  Needle Type: Echogenic Stimulator Needle          Additional Needles:   Narrative:  Start time: 04/14/2020 7:26 AM End time: 04/14/2020 7:36 AM Injection made incrementally with aspirations every 5 mL.  Performed by: Personally  Anesthesiologist: Duane Boston, MD  Additional Notes: A functioning IV was confirmed and monitors were applied.  Sterile prep and drape, hand hygiene and sterile gloves were used.  Negative aspiration and test dose prior to incremental administration of local anesthetic. The patient tolerated the procedure well.Ultrasound  guidance: relevant anatomy identified, needle position confirmed, local anesthetic spread visualized around nerve(s), vascular puncture avoided.  Image printed for medical record.

## 2020-04-14 NOTE — Progress Notes (Signed)
Dr. Carlis Abbott notified of PT/ INR. Ok to proceeded with surgery per Dr. Carlis Abbott.

## 2020-04-14 NOTE — Progress Notes (Signed)
Mobility Specialist: Progress Note   04/14/20 1505  Mobility  Activity Dangled on edge of bed  Level of Assistance Independent  Assistive Device None  Mobility Response Tolerated well  Mobility performed by Mobility specialist  $Mobility charge 1 Mobility   Pre-Mobility: 67 HR, 112/59 BP, 100% SpO2 Post-Mobility: 78 HR, 112/68 BP, 100% SpO2  Pt dangled EOB for 5 minutes. Pt rated his pain 9.9/10 during transfer and dangle but said it eased up after sitting for a few minutes. Pt back to bed with call bell at his side.   [facility] Zanaiya Calabria Mobility Specialist

## 2020-04-14 NOTE — Anesthesia Postprocedure Evaluation (Signed)
Anesthesia Post Note  Patient: Jeremy [name]  Procedure(s) Performed: RIGHT BELOW KNEE AMPUTATION (Right Knee)     Patient location during evaluation: PACU Anesthesia Type: Regional and MAC Level of consciousness: awake and alert Pain management: pain level controlled Vital Signs Assessment: post-procedure vital signs reviewed and stable Respiratory status: spontaneous breathing, nonlabored ventilation, respiratory function stable and patient connected to nasal cannula oxygen Cardiovascular status: stable and blood pressure returned to baseline Postop Assessment: no apparent nausea or vomiting Anesthetic complications: no   No complications documented.  Last Vitals:  Vitals:   04/14/20 0957 04/14/20 1011  BP: 128/62 125/66  Pulse: 78 84  Resp: 20 15  Temp: 36.5 C 36.6 C  SpO2: 100% 100%    Last Pain:  Vitals:   04/14/20 1011  TempSrc: Oral  PainSc: 5                  Eddy Termine DANIEL

## 2020-04-14 NOTE — Transfer of Care (Signed)
Immediate Anesthesia Transfer of Care Note  Patient: Jeremy [name]  Procedure(s) Performed: RIGHT BELOW KNEE AMPUTATION (Right Knee)  Patient Location: PACU  Anesthesia Type:MAC combined with regional for post-op pain  Level of Consciousness: awake, alert  and patient cooperative  Airway & Oxygen Therapy: Patient Spontanous Breathing and Patient connected to face mask oxygen  Post-op Assessment: Report given to RN and Post -op Vital signs reviewed and stable  Post vital signs: Reviewed and stable  Last Vitals:  Vitals Value Taken Time  BP 123/63 04/14/20 0927  Temp    Pulse 36 04/14/20 0928  Resp 15 04/14/20 0928  SpO2 100 % 04/14/20 0928  Vitals shown include unvalidated device data.  Last Pain:  Vitals:   04/14/20 0700  TempSrc: Oral  PainSc:          Complications: No complications documented.

## 2020-04-15 ENCOUNTER — Encounter (HOSPITAL_COMMUNITY): Payer: Self-pay | Admitting: Vascular Surgery

## 2020-04-15 ENCOUNTER — Other Ambulatory Visit: Payer: Self-pay

## 2020-04-15 LAB — RENAL FUNCTION PANEL
Albumin: 2.9 g/dL — ABNORMAL LOW (ref 3.5–5.0)
Anion gap: 14 (ref 5–15)
BUN: 29 mg/dL — ABNORMAL HIGH (ref 8–23)
CO2: 28 mmol/L (ref 22–32)
Calcium: 9.4 mg/dL (ref 8.9–10.3)
Chloride: 97 mmol/L — ABNORMAL LOW (ref 98–111)
Creatinine, Ser: 7.09 mg/dL — ABNORMAL HIGH (ref 0.61–1.24)
GFR, Estimated: 8 mL/min — ABNORMAL LOW (ref >60)
Glucose, Bld: 98 mg/dL (ref 70–99)
Phosphorus: 5.8 mg/dL — ABNORMAL HIGH (ref 2.5–4.6)
Potassium: 4 mmol/L (ref 3.5–5.1)
Sodium: 139 mmol/L (ref 135–145)

## 2020-04-15 LAB — PREPARE FRESH FROZEN PLASMA: Unit division: 0

## 2020-04-15 LAB — CBC
HCT: 29.3 % — ABNORMAL LOW (ref 39.0–52.0)
Hemoglobin: 9.2 g/dL — ABNORMAL LOW (ref 13.0–17.0)
MCH: 35.7 pg — ABNORMAL HIGH (ref 26.0–34.0)
MCHC: 31.4 g/dL (ref 30.0–36.0)
MCV: 113.6 fL — ABNORMAL HIGH (ref 80.0–100.0)
Platelets: 108 10*3/uL — ABNORMAL LOW (ref 150–400)
RBC: 2.58 MIL/uL — ABNORMAL LOW (ref 4.22–5.81)
RDW: 15.1 % (ref 11.5–15.5)
WBC: 5 10*3/uL (ref 4.0–10.5)
nRBC: 0 % (ref 0.0–0.2)

## 2020-04-15 LAB — BPAM FFP
Blood Product Expiration Date: 202201122359
Blood Product Expiration Date: 202201122359
ISSUE DATE / TIME: 202201080035
ISSUE DATE / TIME: 202201080636
Unit Type and Rh: 600
Unit Type and Rh: 6200

## 2020-04-15 LAB — PROTIME-INR
INR: 1.7 — ABNORMAL HIGH (ref 0.8–1.2)
Prothrombin Time: 19.6 seconds — ABNORMAL HIGH (ref 11.4–15.2)

## 2020-04-15 LAB — HEPARIN LEVEL (UNFRACTIONATED)
Heparin Unfractionated: 0.11 IU/mL — ABNORMAL LOW (ref 0.30–0.70)
Heparin Unfractionated: 0.2 IU/mL — ABNORMAL LOW (ref 0.30–0.70)
Heparin Unfractionated: 0.25 IU/mL — ABNORMAL LOW (ref 0.30–0.70)

## 2020-04-15 MED ORDER — METOPROLOL TARTRATE 5 MG/5ML IV SOLN
5.0000 mg | Freq: Four times a day (QID) | INTRAVENOUS | Status: DC | PRN
  Administered 2020-04-15 – 2020-04-20 (×3): 5 mg via INTRAVENOUS
  Filled 2020-04-15 (×3): qty 5

## 2020-04-15 MED ORDER — METHOCARBAMOL 500 MG PO TABS
500.0000 mg | ORAL_TABLET | Freq: Three times a day (TID) | ORAL | Status: DC
  Administered 2020-04-15 – 2020-04-23 (×25): 500 mg via ORAL
  Filled 2020-04-15 (×26): qty 1

## 2020-04-15 NOTE — Progress Notes (Signed)
ANTICOAGULATION CONSULT NOTE - Follow Up Consult  Pharmacy Consult for heparin Indication: Afib/AVR  Heparin weight 75.3 kg  Labs: Recent Labs    04/13/20 1333 04/13/20 1429 04/14/20 0125 04/14/20 1021 04/15/20 0214 04/15/20 1154 04/15/20 2040  HGB 9.3*  --   --   --  9.2*  --   --   HCT 30.2*  --   --   --  29.3*  --   --   PLT 122*  --   --   --  108*  --   --   LABPROT  --    < > 21.2* 19.2* 19.6*  --   --   INR  --    < > 1.9* 1.7* 1.7*  --   --   HEPARINUNFRC  --    < >  --   --  0.11* 0.20* 0.25*  CREATININE 8.52*  --  4.71*  --  7.09*  --   --    < > = values in this interval not displayed.    Assessment: 65yo male subtherapeutic on heparin with initial dosing while INR being reversed for surgery; no gtt issues or signs of bleeding per RN.  Of note, INR 1.7 (not currently on warfarin).   Vascular note says to start warfarin today, but after discussion with Dr. Carlis Abbott the decision was to defer initiation until tomorrow.   Heparin level subtherapeutic s/p rate increase to 1600 units/hr  Goal of Therapy:  Heparin level 0.3-0.7 units/ml  Monitor platelets per protocol.    Plan:  Increase heparin gtt to 1700 units/hr F/u heparin level with AM labs to confirm  Bertis Ruddy, PharmD Clinical Pharmacist ED Pharmacist Phone # [phone] 04/15/2020 9:13 PM

## 2020-04-15 NOTE — Progress Notes (Signed)
ANTICOAGULATION CONSULT NOTE - Follow Up Consult  Pharmacy Consult for heparin Indication: Afib/AVR  Heparin weight 75.3 kg  Labs: Recent Labs    04/12/20 2046 04/13/20 0330 04/13/20 1333 04/13/20 1429 04/13/20 2315 04/14/20 0125 04/14/20 1021 04/15/20 0214 04/15/20 1154  HGB 10.9*  --  9.3*  --   --   --   --  9.2*  --   HCT 33.7*  --  30.2*  --   --   --   --  29.3*  --   PLT 143*  --  122*  --   --   --   --  108*  --   APTT 67*  --   --   --   --   --   --   --   --   LABPROT 39.7*   < >  --    < > 21.6* 21.2* 19.2* 19.6*  --   INR 4.3*   < >  --    < > 2.0* 1.9* 1.7* 1.7*  --   HEPARINUNFRC  --   --   --   --  <0.10*  --   --  0.11* 0.20*  CREATININE 7.25*  --  8.52*  --   --  4.71*  --  7.09*  --    < > = values in this interval not displayed.    Assessment: 65yo male subtherapeutic on heparin with initial dosing while INR being reversed for surgery; no gtt issues or signs of bleeding per RN.  Of note, INR 1.7 (not currently on warfarin).  Heparin level is subtheraputic at 0.2 units/ml. CBC is stable.  Vascular note says to start warfarin today, but after discussion with Dr. Carlis Abbott the decision was to defer initiation until tomorrow.   Goal of Therapy:  Heparin level 0.3-0.7 units/ml  Monitor platelets per protocol.    Plan:  Will increase heparin drip to 1600 units/hr and check level in 6- 8 hours.   Daily heparin level and CBC  Thanks for allowing pharmacy to be a part of this patient's care.  Norina Buzzard, PharmD PGY1 Pharmacy Resident 04/15/2020 1:18 PM

## 2020-04-15 NOTE — Progress Notes (Signed)
Nephrology Follow-Up Consult note   Assessment/Recommendations: Jeremy [name] is a/an 65 y.o. male with a past medical history significant for ESRD on HD admitted for RLE amputation.   # ESRD: MWF schedule. 3.75hrs, 2K, 2.25Ca, Na 137, Bicarbonate 35, Dialyzer: F180. Dialysis again on Monday # Volume/ hypertension: Continue to challenge EDW as tolerated # Anemia of Chronic Kidney Disease: Hemoglobin 9.2. Currently receiving Epo 8000 units 3x weekly. Aranesp 48mcg starting Monday if inpatient.  # Secondary Hyperparathyroidism/Hyperphosphatemia: Home calcitriol 55mcg MWF # Vascular access: LUE AVF with no issues # RLE ischemia. 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  Pain control per primary.  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  - Implement save arm precautions. Prefer needle sticks in the dorsum of the hands or wrists. No blood pressure measurements in arm. - If blood transfusion is requested during hemodialysis sessions, please alert [name] prior to the session.   Recommendations conveyed to primary service.    [facility] 04/15/2020 10:14 AM  ___________________________________________________________  CC: ESRD  Interval History/Subjective: Patient is having pain and stop but otherwise feels okay.    Medications:  Current Facility-Administered Medications  Medication Dose Route Frequency Provider Last Rate Last Admin  . calcitRIOL (ROCALTROL) capsule 1 mcg  1 mcg Oral Q M,W,F Ulyses Amor, PA-C   1 mcg at 04/13/20 1810  . Chlorhexidine Gluconate Cloth 2 % PADS 6 each  6 each Topical Q0600 Ulyses Amor, PA-C   6 each at 04/14/20 0555  . [START ON 04/16/2020] Darbepoetin Alfa (ARANESP) injection 60 mcg  60 mcg Intravenous Q Mon-HD Collins, [name] M, PA-C       . ezetimibe (ZETIA) tablet 10 mg  10 mg Oral Daily Laurence Slate M, PA-C   10 mg at 04/15/20 1008  . heparin ADULT infusion 100 units/mL (25000 units/224mL)  1,400 Units/hr Intravenous Continuous Marty Heck, MD 14 mL/hr at 04/15/20 0920 1,400 Units/hr at 04/15/20 0920  . lamoTRIgine (LAMICTAL) tablet 50 mg  50 mg Oral BID Laurence Slate M, PA-C   50 mg at 04/15/20 1008  . methocarbamol (ROBAXIN) tablet 500 mg  500 mg Oral TID Ulyses Amor, PA-C   500 mg at 04/15/20 1607  . metoprolol tartrate (LOPRESSOR) injection 5 mg  5 mg Intravenous Q6H PRN Marty Heck, MD   5 mg at 04/15/20 1000  . metoprolol tartrate (LOPRESSOR) tablet 75 mg  75 mg Oral BID Laurence Slate M, PA-C   75 mg at 04/15/20 3710  . morphine 4 MG/ML injection 4 mg  4 mg Intravenous Q1H PRN Ulyses Amor, PA-C   4 mg at 04/15/20 0610  . oxyCODONE-acetaminophen (PERCOCET/ROXICET) 5-325 MG per tablet 1-2 tablet  1-2 tablet Oral Q4H PRN Ulyses Amor, PA-C   2 tablet at 04/15/20 0220  . pantoprazole (PROTONIX) EC tablet 40 mg  40 mg Oral Daily Laurence Slate M, PA-C   40 mg at 04/15/20 1008  . rosuvastatin (CRESTOR) tablet 40 mg  40 mg Oral q1800 Ulyses Amor, PA-C   40 mg at 04/14/20 1720  . sevelamer carbonate (RENVELA) tablet 3,200 mg  3,200 mg Oral TID WC Laurence Slate M, PA-C   3,200 mg at 04/15/20 0855  . traZODone (DESYREL) tablet 25 mg  25 mg Oral QHS Laurence Slate  M, PA-C   25 mg at 04/14/20 2137      Review of Systems: 10 systems reviewed and negative except per interval history/subjective  Physical Exam: Vitals:   04/15/20 0855 04/15/20 0856  BP: (!) 160/92 (!) 154/86  Pulse: (!) 119 (!) 119  Resp: 11 10  Temp: 98.1 F (36.7 C) 98.1 F (36.7 C)  SpO2: 98% 100%   No intake/output data recorded.  Intake/Output Summary (Last 24 hours) at 04/15/2020 1014 Last data filed at 04/14/2020 1758 Gross per 24 hour  Intake 123.59 ml  Output --  Net 123.59 ml   Constitutional: well-appearing, no  acute distress ENMT: ears and nose without scars or lesions, MMM CV: normal rate, no edema in lle Respiratory: bilateral chest rise, normal work of breathing Gastrointestinal: soft, non-tender, no palpable masses or hernias Skin: no visible lesions or rashes, RLE wound covered in dressing Ext: RLE absent below the knee Psych: alert, judgement/insight appropriate, appropriate mood and affect   Test Results I personally reviewed new and old clinical labs and radiology tests Lab Results  Component Value Date   NA 139 04/15/2020   K 4.0 04/15/2020   CL 97 (L) 04/15/2020   CO2 28 04/15/2020   BUN 29 (H) 04/15/2020   CREATININE 7.09 (H) 04/15/2020   GLU 98 04/14/2019   CALCIUM 9.4 04/15/2020   ALBUMIN 2.9 (L) 04/15/2020   PHOS 5.8 (H) 04/15/2020

## 2020-04-15 NOTE — Progress Notes (Signed)
Mobility Specialist: Progress Note   04/15/20 1320  Mobility  Activity Dangled on edge of bed  Level of Assistance Independent  Assistive Device None  Mobility Response Tolerated well  Mobility performed by Mobility specialist  $Mobility charge 1 Mobility   Pre-Mobility: 101 HR, 122/81 BP, 93% SpO2 During Mobility: 115 HR Post-Mobility: 116 HR, 132/92 BP, 100% SpO2  Pt dangled on EOB for 5 minutes. Pt c/o pain in R LE at amputation site he rated 4/10 during transfer from supine to sitting EOB. Pt back to bed with call bell at his side.   [facility] Dajane Valli Mobility Specialist

## 2020-04-15 NOTE — Progress Notes (Signed)
ANTICOAGULATION CONSULT NOTE - Follow Up Consult  Pharmacy Consult for heparin Indication: Afib/AVR  Labs: Recent Labs    04/12/20 2046 04/13/20 0330 04/13/20 1333 04/13/20 1429 04/13/20 2315 04/14/20 0125 04/14/20 1021 04/15/20 0214  HGB 10.9*  --  9.3*  --   --   --   --  9.2*  HCT 33.7*  --  30.2*  --   --   --   --  29.3*  PLT 143*  --  122*  --   --   --   --  108*  APTT 67*  --   --   --   --   --   --   --   LABPROT 39.7*   < >  --    < > 21.6* 21.2* 19.2* 19.6*  INR 4.3*   < >  --    < > 2.0* 1.9* 1.7* 1.7*  HEPARINUNFRC  --   --   --   --  <0.10*  --   --  0.11*  CREATININE 7.25*  --  8.52*  --   --  4.71*  --   --    < > = values in this interval not displayed.    Assessment: 65yo male subtherapeutic on heparin with initial dosing while INR being reversed for surgery; no gtt issues or signs of bleeding per RN.  Of note INR still 2; spoke w/ Dr Carlis Abbott who is ordering another unit of FFP. Heparin resumed post op.  Heparin level this am 0.11 units/ml  Goal of Therapy:  Heparin level 0.3-0.7 units/ml   Plan:  Will increase heparin drip to 1400  units/hr and check level in 6- 8 hours.    Thanks for allowing pharmacy to be a part of this patient's care.  Excell Seltzer, PharmD Clinical Pharmacist  04/15/2020,4:09 AM

## 2020-04-15 NOTE — Progress Notes (Addendum)
[facility] [place]  Subjective  - Pain in stump   Objective 111/63 76 98.1 F (36.7 C) (Oral) 14 98%  Intake/Output Summary (Last 24 hours) at 04/15/2020 0831 Last data filed at 04/14/2020 1758 Gross per 24 hour  Intake 423.59 ml  Output 150 ml  Net 273.59 ml    Right BKA dressing C/D/ I Lungs non labored breathing Mechanical valve on Heparin  Assessment/Planning: POD # 1 right BKA  I will order robaxin muscle relaxer and see if this helps his pain.  He does not like taking narcotics Restart coumadin per Dr. Carlis Abbott Plan for dressing change tomorrow  Roxy Horseman 04/15/2020 8:31 AM --  Laboratory Lab Results: Recent Labs    04/13/20 1333 04/15/20 0214  WBC 5.7 5.0  HGB 9.3* 9.2*  HCT 30.2* 29.3*  PLT 122* 108*   BMET Recent Labs    04/14/20 0125 04/15/20 0214  NA 140 139  K 3.4* 4.0  CL 97* 97*  CO2 30 28  GLUCOSE 101* 98  BUN 13 29*  CREATININE 4.71* 7.09*  CALCIUM 9.3 9.4    COAG Lab Results  Component Value Date   INR 1.7 (H) 04/15/2020   INR 1.7 (H) 04/14/2020   INR 1.9 (H) 04/14/2020   No results found for: PTT   I have seen and evaluated the patient. I agree with the PA note as documented above.  Postop day 1 status post right BKA.  Dressing is clean dry and intact.  We will remove dressing tomorrow.  Pain control ongoing issue and has IV morphine for breakthrough and oral Percocet as ordered.  PA added Robaxin.  Dressing down tomorrow.  I will add IV metoprolol as needed for A. fib with RVR which is chronic.  He is on his home metoprolol dose.  Heparin bridge for mechanical aortic valve.  Hemoglobin 9.2.  Dialysis again tomorrow and appreciate nephrology following for ESRD.  Marty Heck, MD [facility] [place] Office: [phone]

## 2020-04-16 LAB — HEPARIN LEVEL (UNFRACTIONATED): Heparin Unfractionated: 0.28 IU/mL — ABNORMAL LOW (ref 0.30–0.70)

## 2020-04-16 LAB — CBC
HCT: 27.4 % — ABNORMAL LOW (ref 39.0–52.0)
Hemoglobin: 8.2 g/dL — ABNORMAL LOW (ref 13.0–17.0)
MCH: 33.6 pg (ref 26.0–34.0)
MCHC: 29.9 g/dL — ABNORMAL LOW (ref 30.0–36.0)
MCV: 112.3 fL — ABNORMAL HIGH (ref 80.0–100.0)
Platelets: 102 10*3/uL — ABNORMAL LOW (ref 150–400)
RBC: 2.44 MIL/uL — ABNORMAL LOW (ref 4.22–5.81)
RDW: 15 % (ref 11.5–15.5)
WBC: 6.2 10*3/uL (ref 4.0–10.5)
nRBC: 0 % (ref 0.0–0.2)

## 2020-04-16 LAB — RENAL FUNCTION PANEL
Albumin: 2.7 g/dL — ABNORMAL LOW (ref 3.5–5.0)
Anion gap: 17 — ABNORMAL HIGH (ref 5–15)
BUN: 40 mg/dL — ABNORMAL HIGH (ref 8–23)
CO2: 25 mmol/L (ref 22–32)
Calcium: 9.3 mg/dL (ref 8.9–10.3)
Chloride: 94 mmol/L — ABNORMAL LOW (ref 98–111)
Creatinine, Ser: 9.1 mg/dL — ABNORMAL HIGH (ref 0.61–1.24)
GFR, Estimated: 6 mL/min — ABNORMAL LOW (ref >60)
Glucose, Bld: 101 mg/dL — ABNORMAL HIGH (ref 70–99)
Phosphorus: 5.8 mg/dL — ABNORMAL HIGH (ref 2.5–4.6)
Potassium: 4.2 mmol/L (ref 3.5–5.1)
Sodium: 136 mmol/L (ref 135–145)

## 2020-04-16 LAB — PROTIME-INR
INR: 2.5 — ABNORMAL HIGH (ref 0.8–1.2)
Prothrombin Time: 26.1 seconds — ABNORMAL HIGH (ref 11.4–15.2)

## 2020-04-16 MED ORDER — WARFARIN 0.5 MG HALF TABLET
0.5000 mg | ORAL_TABLET | Freq: Once | ORAL | Status: AC
  Administered 2020-04-16: 0.5 mg via ORAL
  Filled 2020-04-16 (×2): qty 1

## 2020-04-16 MED ORDER — CALCITRIOL 0.5 MCG PO CAPS
ORAL_CAPSULE | ORAL | Status: AC
  Administered 2020-04-16: 1 ug via ORAL
  Filled 2020-04-16: qty 2

## 2020-04-16 MED ORDER — DARBEPOETIN ALFA 60 MCG/0.3ML IJ SOSY
PREFILLED_SYRINGE | INTRAMUSCULAR | Status: AC
  Administered 2020-04-16: 60 ug via INTRAVENOUS
  Filled 2020-04-16: qty 0.3

## 2020-04-16 MED ORDER — WARFARIN - PHARMACIST DOSING INPATIENT: Freq: Every day | Status: DC

## 2020-04-16 NOTE — Progress Notes (Signed)
Inpatient Rehabilitation Admissions Coordinator  Inpatient rehab consult received. I await PT and OT evals postop to  assist with planning rehab venue options.  Danne Baxter, RN, MSN Rehab Admissions Coordinator [phone] 04/16/2020 1:52 PM

## 2020-04-16 NOTE — Progress Notes (Addendum)
[facility] [place]  Subjective  - Currently on HD states he feels better today and the muscle relaxer helped.   Objective 103/62 79 98.6 F (37 C) (Oral) 12 98% No intake or output data in the 24 hours ending 04/16/20 0848  Right LE dressing clean and dry Thigh warm to touch Lungs non labored breathing  Assessment/Planning: POD # 2 right BKA with ischemic wounds  Plan to change dressing after HD Stable disposition  I restarted Coumadin per pharmacy today for mechanical valve  Roxy Horseman 04/16/2020 8:48 AM --  Laboratory Lab Results: Recent Labs    04/15/20 0214 04/16/20 0147  WBC 5.0 6.2  HGB 9.2* 8.2*  HCT 29.3* 27.4*  PLT 108* 102*   BMET Recent Labs    04/15/20 0214 04/16/20 0147  NA 139 136  K 4.0 4.2  CL 97* 94*  CO2 28 25  GLUCOSE 98 101*  BUN 29* 40*  CREATININE 7.09* 9.10*  CALCIUM 9.4 9.3    COAG Lab Results  Component Value Date   INR 2.5 (H) 04/16/2020   INR 1.7 (H) 04/15/2020   INR 1.7 (H) 04/14/2020   No results found for: PTT  I have seen and evaluated the patient. I agree with the PA note as documented above.  Postop day 2 status post right BKA.  I changed the dressing this afternoon and he is having a fair amount of pain in the stump.  I am a little concerned he may have a hematoma developing given his discomfort.  Hgb did drop 1 unit.  His INR is 2.5 and it seems reasonable to stop the heparin given his INR therapeutic and I have discussed with pharmacy.  I wrapped his BKA with a fairly good pressure dressing and I will have him elevate the stump tonight.  He does have plenty of pain meds ordered that seem to be working.  Got dialysis today for ESRD.    Marty Heck, MD [facility] [place] Office: [phone]

## 2020-04-16 NOTE — Progress Notes (Addendum)
Nephrology Follow-Up Consult Note   Assessment/Recommendations: Jeremy [name] is a/an 65 y.o. male with a past medical history significant for ESRD on HD admitted for RLE amputation.   # ESRD: MWF schedule. 3.75hrs, 2K, 2.25Ca, Na 137, Bicarbonate 35, Dialyzer: F180. Tolerating treatment currently, will maintain MWF # Volume/ hypertension: Continue to challenge EDW as tolerated # Anemia of Chronic Kidney Disease: Hemoglobin 9.2. Currently receiving Epo 8000 units 3x weekly. Aranesp 61mcg 1/10 # Secondary Hyperparathyroidism/Hyperphosphatemia: Home calcitriol 53mcg MWF # Vascular access: LUE AVF with no issues # RLE ischemia. 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  Pain control and dressing changes per primary. Recommend avoiding morphine and using agents such as hydromorphone or fentanyl given that he is on dialysis # Mechanical valve: on warfarin outpt, heparin bridge here, mgmt per primary  # Additional recommendations: - Dose all meds for creatinine clearance <10 ml/min  - Unless absolutely necessary, no MRIs with gadolinium.  - Implement save arm precautions. Prefer needle sticks in the dorsum of the hands or wrists. No blood pressure measurements in arm. - If blood transfusion is requested during hemodialysis sessions, please alert [name] prior to the session.   Recommendations conveyed to primary service.    [facility] 04/16/2020 7:33 AM  ___________________________________________________________  CC: ESRD  Interval History/Subjective:  Patient seen and examined on HD today. No acute events. Tolerating treatment, no complaints.   Medications:  Current Facility-Administered Medications  Medication Dose Route Frequency Provider Last Rate Last Admin  . calcitRIOL (ROCALTROL) capsule 1 mcg  1 mcg Oral Q M,W,F Ulyses Amor, PA-C   1 mcg at 04/13/20 1810  . Chlorhexidine Gluconate Cloth 2 % PADS 6 each  6 each Topical Q0600 Ulyses Amor, PA-C   6 each at  04/16/20 3007  . Darbepoetin Alfa (ARANESP) injection 60 mcg  60 mcg Intravenous Q Mon-HD Collins, [name] M, PA-C      . ezetimibe (ZETIA) tablet 10 mg  10 mg Oral Daily Laurence Slate M, PA-C   10 mg at 04/15/20 1008  . heparin ADULT infusion 100 units/mL (25000 units/260mL)  1,800 Units/hr Intravenous Continuous Laren Everts, RPH 18 mL/hr at 04/16/20 0304 1,800 Units/hr at 04/16/20 0304  . lamoTRIgine (LAMICTAL) tablet 50 mg  50 mg Oral BID Laurence Slate M, PA-C   50 mg at 04/15/20 2127  . methocarbamol (ROBAXIN) tablet 500 mg  500 mg Oral TID Ulyses Amor, PA-C   500 mg at 04/16/20 6226  . metoprolol tartrate (LOPRESSOR) injection 5 mg  5 mg Intravenous Q6H PRN Marty Heck, MD   5 mg at 04/15/20 1618  . metoprolol tartrate (LOPRESSOR) tablet 75 mg  75 mg Oral BID Ulyses Amor, PA-C   75 mg at 04/15/20 2127  . morphine 4 MG/ML injection 4 mg  4 mg Intravenous Q1H PRN Ulyses Amor, PA-C   4 mg at 04/15/20 2127  . oxyCODONE-acetaminophen (PERCOCET/ROXICET) 5-325 MG per tablet 1-2 tablet  1-2 tablet Oral Q4H PRN Ulyses Amor, PA-C   2 tablet at 04/16/20 3335  . pantoprazole (PROTONIX) EC tablet 40 mg  40 mg Oral Daily Laurence Slate M, PA-C   40 mg at 04/15/20 1008  . rosuvastatin (CRESTOR) tablet 40 mg  40 mg Oral q1800 Ulyses Amor, PA-C   40 mg at 04/15/20 1727  . sevelamer carbonate (RENVELA) tablet 3,200 mg  3,200 mg Oral TID WC Laurence Slate M, PA-C   3,200 mg at 04/15/20 1844  .  traZODone (DESYREL) tablet 25 mg  25 mg Oral QHS Laurence Slate M, PA-C   25 mg at 04/15/20 2127      Review of Systems: 10 systems reviewed and negative except per interval history/subjective  Physical Exam: Vitals:   04/15/20 2300 04/16/20 0309  BP: 140/78 122/85  Pulse: (!) 102 (!) 101  Resp: 16 20  Temp: 98.9 F (37.2 C) 98.3 F (36.8 C)  SpO2: 96% 96%   No intake/output data recorded. No intake or output data in the 24 hours ending 04/16/20 4383 Constitutional: well-appearing,  no acute distress ENMT: ears and nose without scars or lesions, MMM CV: normal rate, no edema in lle Respiratory: bilateral chest rise, normal work of breathing Gastrointestinal: soft, non-tender, no palpable masses or hernias Skin: no visible lesions or rashes, RLE wound covered in dressing Ext: RLE absent below the knee, lue avf +b/t Psych: alert, judgement/insight appropriate, appropriate mood and affect   Test Results I personally reviewed new and old clinical labs and radiology tests Lab Results  Component Value Date   NA 136 04/16/2020   K 4.2 04/16/2020   CL 94 (L) 04/16/2020   CO2 25 04/16/2020   BUN 40 (H) 04/16/2020   CREATININE 9.10 (H) 04/16/2020   GLU 98 04/14/2019   CALCIUM 9.3 04/16/2020   ALBUMIN 2.7 (L) 04/16/2020   PHOS 5.8 (H) 04/16/2020

## 2020-04-16 NOTE — Plan of Care (Signed)
°  Problem: Clinical Measurements: °Goal: Respiratory complications will improve °Outcome: Progressing °Goal: Cardiovascular complication will be avoided °Outcome: Progressing °  °Problem: Activity: °Goal: Risk for activity intolerance will decrease °Outcome: Progressing °  °

## 2020-04-16 NOTE — Progress Notes (Addendum)
ANTICOAGULATION CONSULT NOTE - Follow Up Consult  Pharmacy Consult for heparin / Wafarin Indication: Afib/AVR  Labs: Recent Labs    04/13/20 1333 04/13/20 1429 04/14/20 0125 04/14/20 1021 04/15/20 0214 04/15/20 1154 04/15/20 2040 04/16/20 0147  HGB 9.3*  --   --   --  9.2*  --   --   --   HCT 30.2*  --   --   --  29.3*  --   --   --   PLT 122*  --   --   --  108*  --   --   --   LABPROT  --    < > 21.2* 19.2* 19.6*  --   --  26.1*  INR  --    < > 1.9* 1.7* 1.7*  --   --  2.5*  HEPARINUNFRC  --    < >  --   --  0.11* 0.20* 0.25* 0.28*  CREATININE 8.52*  --  4.71*  --  7.09*  --   --  9.10*   < > = values in this interval not displayed.    Assessment: 65yo male remains subtherapeutic on heparin though approaching goal; no gtt issues or signs of bleeding per RN.   Resuming warfarin today for AVR - dose PTA 1 mg daily, INR already at 2.5 today despite no warfarin  Goal of Therapy:  Heparin level 0.3-0.7 units/ml  INR goal 2.5 to 3.5   Plan:  Heparin to 1800 units / hr - will dc heparin in AM if INR within range Warfarin 0.5 mg po x 1 dose at 1800 pm Daily INR, heparin level, CBC   Thank you Anette Guarneri, PharmD 04/16/2020

## 2020-04-16 NOTE — Progress Notes (Signed)
Mobility Specialist: Progress Note   04/16/20 1332  Mobility  Activity Dangled on edge of bed  Level of Assistance Independent  Assistive Device None  Mobility Response Tolerated fair  Mobility performed by Mobility specialist  $Mobility charge 1 Mobility   Pre-Mobility: 97 HR, 100% SpO2 During Mobility: 121 HR Post-Mobility: 104 HR, 118/94 BP, 100% SpO2  Offered to assist pt to chair. Pt said he doesn't have the energy after going to HD earlier today. Pt sat on EOB for 5 minutes. Pt independent to EOB but transfer was slower today due to pain level, no rating given. Pt said he wants to hold off on receiving any pain medication at the moment, RN notified.  [facility] Calyssa Zobrist Mobility Specialist

## 2020-04-16 NOTE — Progress Notes (Signed)
Pt received from HD. VSS. R leg pain 9/10. 4mg  morphine given. Lunch ordered. Will continue to monitor. Clyde Canterbury, RN

## 2020-04-16 NOTE — Evaluation (Signed)
Physical Therapy Evaluation Patient Details Name: Jeremy [name] MRN: [id number] DOB: [birth year] Today's Date: 04/16/2020   History of Present Illness  Pt is a 65 y.o. 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  Clinical Impression  Prior to admission, pt is community ambulatory with a Rollator and is independent with ADL's/IADL's. Pt presents with decreased functional mobility s/p right transtibial amputation with deficits noted in strength, balance, and range of motion. Pt sleepy from recent pain medication but still agreeable to participate. Requiring up to min assist for bed mobility, moderate assist for transfers to standing to walker. Initiated education regarding prosthetic expectations/requirement, phantom limb pain and desensitization techniques, knee positioning, and exercises included quad sets. Recommending CIR to maximize functional independence and decrease caregiver burden.     Follow Up Recommendations CIR    Equipment Recommendations  3in1 (PT);Wheelchair (measurements PT);Wheelchair cushion (measurements PT) (right amputee legrest)    Recommendations for Other Services       Precautions / Restrictions Precautions Precautions: Fall Restrictions Weight Bearing Restrictions: Yes RLE Weight Bearing: Non weight bearing      Mobility  Bed Mobility Overal bed mobility: Needs Assistance Bed Mobility: Supine to Sit;Sit to Supine     Supine to sit: Min guard Sit to supine: Min assist   General bed mobility comments: Min guard to progress to edge of bed, minA for LE assist back into bed    Transfers Overall transfer level: Needs assistance Equipment used: Rolling walker (2 wheeled) Transfers: Sit to/from Stand Sit to Stand: Mod assist         General transfer comment: Pt able to successfully stand  upright on 3rd attempt with modA to boost up. Cues for hand placement, rocking to gain momentum and hip extension  Ambulation/Gait                Stairs            Wheelchair Mobility    Modified Rankin (Stroke Patients Only)       Balance                                             Pertinent Vitals/Pain Pain Assessment: Faces Faces Pain Scale: Hurts even more Pain Location: right residual limb Pain Descriptors / Indicators: Aching Pain Intervention(s): Limited activity within patient's tolerance;Monitored during session;Premedicated before session    Home Living Family/patient expects to be discharged to:: Private residence Living Arrangements: Spouse/significant other (girlfriend) Available Help at Discharge: Family Type of Home: House Home Access: Stairs to enter   Technical brewer of Steps: 1 Home Layout: One level;Able to live on main level with bedroom/bathroom Home Equipment: Gilford Rile - 2 wheels;Walker - 4 wheels;Cane - single point      Prior Function Level of Independence: Independent with assistive device(s)         Comments: Ambulating with Rollator, driving     Hand Dominance        Extremity/Trunk Assessment   Upper Extremity Assessment Upper Extremity Assessment: Overall WFL for tasks assessed    Lower Extremity Assessment Lower Extremity Assessment: RLE deficits/detail;LLE deficits/detail RLE Deficits / Details: s/p BKA. Grossly 2/5 strength LLE Deficits / Details: Strength grossly 4/5    Cervical / Trunk Assessment Cervical /  Trunk Assessment: Kyphotic  Communication   Communication: No difficulties  Cognition Arousal/Alertness: Suspect due to medications Behavior During Therapy: WFL for tasks assessed/performed Overall Cognitive Status: Within Functional Limits for tasks assessed                                 General Comments: Drowsy from pain medications, intermittently falling  asleep      General Comments      Exercises Amputee Exercises Quad Sets: Right;5 reps;Supine   Assessment/Plan    PT Assessment Patient needs continued PT services  PT Problem List Decreased strength;Decreased range of motion;Decreased activity tolerance;Decreased balance;Decreased mobility;Pain       PT Treatment Interventions DME instruction;Gait training;Functional mobility training;Therapeutic activities;Therapeutic exercise;Balance training;Patient/family education;Wheelchair mobility training    PT Goals (Current goals can be found in the Care Plan section)  Acute Rehab PT Goals Patient Stated Goal: less pain PT Goal Formulation: With patient/family Time For Goal Achievement: 04/30/20 Potential to Achieve Goals: Good    Frequency Min 3X/week   Barriers to discharge        Co-evaluation               AM-PAC PT "6 Clicks" Mobility  Outcome Measure Help needed turning from your back to your side while in a flat bed without using bedrails?: None Help needed moving from lying on your back to sitting on the side of a flat bed without using bedrails?: A Little Help needed moving to and from a bed to a chair (including a wheelchair)?: A Lot Help needed standing up from a chair using your arms (e.g., wheelchair or bedside chair)?: A Lot Help needed to walk in hospital room?: A Lot Help needed climbing 3-5 steps with a railing? : Total 6 Click Score: 14    End of Session Equipment Utilized During Treatment: Gait belt Activity Tolerance: Patient tolerated treatment well Patient left: in bed;with call bell/phone within reach;with bed alarm set;with family/visitor present Nurse Communication: Mobility status PT Visit Diagnosis: Unsteadiness on feet (R26.81);Other abnormalities of gait and mobility (R26.89);Muscle weakness (generalized) (M62.81);Difficulty in walking, not elsewhere classified (R26.2);Pain Pain - Right/Left: Right Pain - part of body:  (residual  limb)    Time: 4287-6811 PT Time Calculation (min) (ACUTE ONLY): 25 min   Charges:   PT Evaluation $PT Eval Moderate Complexity: 1 Mod PT Treatments $Therapeutic Activity: 8-22 mins        Jeremy [name], PT, DPT Acute Rehabilitation Services Pager [phone] Office [phone]   Deno Etienne 04/16/2020, 5:10 PM

## 2020-04-17 ENCOUNTER — Inpatient Hospital Stay (HOSPITAL_COMMUNITY): Payer: Medicare Other

## 2020-04-17 LAB — PROTIME-INR
INR: 3.4 — ABNORMAL HIGH (ref 0.8–1.2)
Prothrombin Time: 33.4 seconds — ABNORMAL HIGH (ref 11.4–15.2)

## 2020-04-17 LAB — CBC
HCT: 27.4 % — ABNORMAL LOW (ref 39.0–52.0)
Hemoglobin: 8.5 g/dL — ABNORMAL LOW (ref 13.0–17.0)
MCH: 34.7 pg — ABNORMAL HIGH (ref 26.0–34.0)
MCHC: 31 g/dL (ref 30.0–36.0)
MCV: 111.8 fL — ABNORMAL HIGH (ref 80.0–100.0)
Platelets: 110 10*3/uL — ABNORMAL LOW (ref 150–400)
RBC: 2.45 MIL/uL — ABNORMAL LOW (ref 4.22–5.81)
RDW: 15 % (ref 11.5–15.5)
WBC: 6.1 10*3/uL (ref 4.0–10.5)
nRBC: 0 % (ref 0.0–0.2)

## 2020-04-17 LAB — RENAL FUNCTION PANEL
Albumin: 2.6 g/dL — ABNORMAL LOW (ref 3.5–5.0)
Anion gap: 12 (ref 5–15)
BUN: 21 mg/dL (ref 8–23)
CO2: 28 mmol/L (ref 22–32)
Calcium: 9.8 mg/dL (ref 8.9–10.3)
Chloride: 98 mmol/L (ref 98–111)
Creatinine, Ser: 6 mg/dL — ABNORMAL HIGH (ref 0.61–1.24)
GFR, Estimated: 10 mL/min — ABNORMAL LOW (ref >60)
Glucose, Bld: 111 mg/dL — ABNORMAL HIGH (ref 70–99)
Phosphorus: 4.4 mg/dL (ref 2.5–4.6)
Potassium: 4 mmol/L (ref 3.5–5.1)
Sodium: 138 mmol/L (ref 135–145)

## 2020-04-17 LAB — SURGICAL PATHOLOGY

## 2020-04-17 LAB — HEPARIN LEVEL (UNFRACTIONATED): Heparin Unfractionated: <0.1 IU/mL — ABNORMAL LOW (ref 0.30–0.70)

## 2020-04-17 MED ORDER — POLYETHYLENE GLYCOL 3350 17 G PO PACK
17.0000 g | PACK | Freq: Every day | ORAL | Status: DC
  Administered 2020-04-17 – 2020-04-23 (×6): 17 g via ORAL
  Filled 2020-04-17 (×6): qty 1

## 2020-04-17 NOTE — Care Management Important Message (Signed)
Important Message  Patient Details  Name: Jeremy [name] MRN: [id number] Date of Birth: Nov 25, 1955   Medicare Important Message Given:  Yes     Shelda Altes 04/17/2020, 9:26 AM

## 2020-04-17 NOTE — Progress Notes (Signed)
ANTICOAGULATION CONSULT NOTE - Follow Up Consult  Pharmacy Consult for heparin / Wafarin Indication: Afib/AVR  Labs: Recent Labs    04/15/20 0214 04/15/20 1154 04/15/20 2040 04/16/20 0147 04/17/20 0218  HGB 9.2*  --   --  8.2* 8.5*  HCT 29.3*  --   --  27.4* 27.4*  PLT 108*  --   --  102* 110*  LABPROT 19.6*  --   --  26.1* 33.4*  INR 1.7*  --   --  2.5* 3.4*  HEPARINUNFRC 0.11*   < > 0.25* 0.28* <0.10*  CREATININE 7.09*  --   --  9.10* 6.00*   < > = values in this interval not displayed.    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 He is on warfarin PTA for mech AVR/afib and this was restarted 1/10 (he is no longer on IV heparin). INR was 10.2 on 1/3 and he is noted s/p FFP since then.  -INR up to 3.4 (only 1 dose of 0.5mg  given this admit) -he may be vitamin K deficient   Goal of Therapy:  INR goal 2.5 to 3.5   Plan:  -hold warfarin today (anticipate INR trend up) -Daily PT/INR  Hildred Laser, PharmD Clinical Pharmacist **Pharmacist phone directory can now be found on [email or web address] ([email or web address]).  Listed under [place].

## 2020-04-17 NOTE — Progress Notes (Signed)
Mobility Specialist: Progress Note   04/17/20 1400  Mobility  Activity Ambulated in room  Level of Assistance Minimal assist, patient does 75% or more  Assistive Device Front wheel walker  Distance Ambulated (ft) 20 ft  Mobility Response Tolerated well  Mobility performed by Mobility specialist  $Mobility charge 1 Mobility   Pre-Mobility: 75 HR During Mobility: 114 HR Post-Mobility: 91 HR, 124/71 BP, 100% SpO2  Pt hopped laterally to/from the head and foot of the bed 3x using the RW with a short rest break between. Pt then hopped to the door and back from the bed using RW. Pt was minA from sit to stand and contact guard during ambulation. Pt did experience posterior lean a couple times due to LOB, recovered quickly. Pt back to bed with some bleeding through bandage on R LE, RN notified.   [facility] Jeremy [name] Mobility Specialist

## 2020-04-17 NOTE — Progress Notes (Addendum)
  Progress Note    04/17/2020 7:43 AM 3 Days Post-Op  Subjective:  Pain in R BKA stump   Vitals:   04/17/20 0048 04/17/20 0513  BP:  100/71  Pulse:  80  Resp:  16  Temp: 100.3 F (37.9 C) 98 F (36.7 C)  SpO2:  99%   Physical Exam: Lungs:  Non labored Incisions:  R BKA incision with drainage from lateral aspect, serosanguinous; no palpable fluid collection; skin edges viable Neurologic: A&O  CBC    Component Value Date/Time   WBC 6.1 04/17/2020 0218   RBC 2.45 (L) 04/17/2020 0218   HGB 8.5 (L) 04/17/2020 0218   HGB 10.0 (L) 08/30/2014 1013   HCT 27.4 (L) 04/17/2020 0218   HCT 32.2 (L) 08/30/2014 1013   PLT 110 (L) 04/17/2020 0218   PLT 125 (L) 08/30/2014 1013   MCV 111.8 (H) 04/17/2020 0218   MCV 88.2 09/16/2014 1041   MCV 89 08/30/2014 1013   MCH 34.7 (H) 04/17/2020 0218   MCHC 31.0 04/17/2020 0218   RDW 15.0 04/17/2020 0218   RDW 14.0 08/30/2014 1013   LYMPHSABS 1.2 10/03/2019 0348   LYMPHSABS 0.8 08/30/2014 1013   MONOABS 0.4 10/03/2019 0348   EOSABS 0.1 10/03/2019 0348   EOSABS 0.1 08/30/2014 1013   BASOSABS 0.0 10/03/2019 0348   BASOSABS 0.0 08/30/2014 1013    BMET    Component Value Date/Time   NA 138 04/17/2020 0218   NA 143 04/14/2019 0000   K 4.0 04/17/2020 0218   CL 98 04/17/2020 0218   CO2 28 04/17/2020 0218   GLUCOSE 111 (H) 04/17/2020 0218   BUN 21 04/17/2020 0218   BUN 20 04/14/2019 0000   CREATININE 6.00 (H) 04/17/2020 0218   CALCIUM 9.8 04/17/2020 0218   CALCIUM 9.2 12/14/2015 1059   GFRNONAA 10 (L) 04/17/2020 0218   GFRAA 8 (L) 12/23/2019 0858    INR    Component Value Date/Time   INR 3.4 (H) 04/17/2020 0218   INR 2.3 03/27/2020 0000   INR 2.3 03/27/2020 0000     Intake/Output Summary (Last 24 hours) at 04/17/2020 0743 Last data filed at 04/16/2020 2313 Gross per 24 hour  Intake 0 ml  Output 1500 ml  Net -1500 ml     Assessment/Plan:  65 y.o. male is s/p R BKA 3 Days Post-Op   R BKA incision unremarkable;  continue daily dressing changes with kerlex and ACE wrap 1 view chest XR shows an indeterminate lesion; check 2 view XR today Fever overnight; blood cultures sent, WBC wnl, repeat CXR Inpatient rehab to evaluate patient today   Dagoberto Ligas, PA-C [facility] [phone] 04/17/2020 7:43 AM  I have seen and evaluated the patient. I agree with the PA note as documented above.  65 year old male status post right BKA.  BKA looks better today.  INR is therapeutic so we have stopped his heparin.  Continue to work with PT OT.  He did have a fever of 101.7 but his white count is normal and his stump looks okay.  We will check blood cultures given his mechanical aortic valve.  Hgb stable.  Marty Heck, MD [facility] [place] Office: [phone]

## 2020-04-17 NOTE — Progress Notes (Signed)
Physical Therapy Treatment Patient Details Name: Jeremy [name] MRN: [id number] DOB: [birth year] Today's Date: 04/17/2020    History of Present Illness Pt is a 65 y.o. 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    PT Comments    Pt supine in bed on arrival this session.  Pt reports feeling fatigued from mobility throughout the day.  Pt eager and agreeable to participate.  Focused on LE strengthening in bed from bed level.  Pt did perform sit to stand x 1 with increased assistance this pm.  Continues to be an excellent candidate for CIR stay.       Follow Up Recommendations  CIR     Equipment Recommendations  3in1 (PT);Wheelchair (measurements PT);Wheelchair cushion (measurements PT)    Recommendations for Other Services       Precautions / Restrictions Precautions Precautions: Fall Restrictions Weight Bearing Restrictions: Yes RLE Weight Bearing: Non weight bearing    Mobility  Bed Mobility Overal bed mobility: Needs Assistance Bed Mobility: Supine to Sit;Sit to Supine     Supine to sit: Min guard Sit to supine: Min guard   General bed mobility comments: Min guard to and from seated surface.  Transfers Overall transfer level: Needs assistance Equipment used: Rolling walker (2 wheeled) Transfers: Sit to/from Stand Sit to Stand: Mod assist         General transfer comment: Cues for hand placement.  Pt in more pain this pm and more fatigued after working with mobility specialist and OT he required increased assistance this pm.  Mod assistance to boost into standing.  He attempted one hop step forward and reports intolerable pain so performed one hop step back to bed.  Ambulation/Gait                 Stairs             Wheelchair Mobility    Modified Rankin (Stroke Patients Only)        Balance Overall balance assessment: Needs assistance Sitting-balance support: Bilateral upper extremity supported;Feet supported Sitting balance-Leahy Scale: Fair       Standing balance-Leahy Scale: Poor Standing balance comment: Heavy reliance on BUE on RW and occasional external assist.                            Cognition Arousal/Alertness: Awake/alert Behavior During Therapy: WFL for tasks assessed/performed Overall Cognitive Status: Within Functional Limits for tasks assessed                                        Exercises General Exercises - Lower Extremity Ankle Circles/Pumps: AROM;Left;20 reps;Supine Quad Sets: AROM;10 reps;Supine;Left Heel Slides: AROM;Left;10 reps;Supine Hip ABduction/ADduction: AROM;Left;10 reps;Supine Straight Leg Raises: AROM;Left;10 reps;Supine Amputee Exercises Quad Sets: AROM;Right;10 reps;Supine Hip Extension: AROM;Right;10 reps;Sidelying Hip ABduction/ADduction: AROM;Right;10 reps;Supine Hip Flexion/Marching: AROM;Right;10 reps;Supine Knee Flexion: AROM;Right;10 reps;Supine Straight Leg Raises: AROM;Right;10 reps;Supine    General Comments        Pertinent Vitals/Pain Pain Assessment: 0-10 Pain Score: 7  Pain Location: right residual limb Pain Descriptors / Indicators: Aching Pain Intervention(s): Monitored during session;Repositioned    Home Living  Prior Function            PT Goals (current goals can now be found in the care plan section) Acute Rehab PT Goals Patient Stated Goal: To go to CIR Potential to Achieve Goals: Good Progress towards PT goals: Progressing toward goals    Frequency    Min 3X/week      PT Plan Current plan remains appropriate    Co-evaluation              AM-PAC PT "6 Clicks" Mobility   Outcome Measure  Help needed turning from your back to your side while in a flat bed without using bedrails?: None Help needed moving from  lying on your back to sitting on the side of a flat bed without using bedrails?: A Little Help needed moving to and from a bed to a chair (including a wheelchair)?: A Lot Help needed standing up from a chair using your arms (e.g., wheelchair or bedside chair)?: A Lot Help needed to walk in hospital room?: A Lot Help needed climbing 3-5 steps with a railing? : Total 6 Click Score: 14    End of Session Equipment Utilized During Treatment: Gait belt Activity Tolerance: Patient tolerated treatment well Patient left: in bed;with call bell/phone within reach;with bed alarm set;with family/visitor present Nurse Communication: Mobility status PT Visit Diagnosis: Unsteadiness on feet (R26.81);Other abnormalities of gait and mobility (R26.89);Muscle weakness (generalized) (M62.81);Difficulty in walking, not elsewhere classified (R26.2);Pain Pain - Right/Left: Right Pain - part of body:  (residual limb)     Time: 3748-2707 PT Time Calculation (min) (ACUTE ONLY): 25 min  Charges:  $Therapeutic Exercise: 8-22 mins $Therapeutic Activity: 8-22 mins                     Erasmo Leventhal , PTA Acute Rehabilitation Services Pager [phone] Office [phone]     Kanasia Gayman Eli Hose 04/17/2020, 4:45 PM

## 2020-04-17 NOTE — Plan of Care (Signed)
  Problem: Health Behavior/Discharge Planning: Goal: Ability to manage health-related needs will improve Outcome: Progressing   

## 2020-04-17 NOTE — Evaluation (Signed)
Occupational Therapy Evaluation Patient Details Name: Jeremy [name] MRN: [id number] DOB: 08-01-1955 Today's Date: 04/17/2020    History of Present Illness Pt is a 65 y.o. 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   Clinical Impression   PTA patient was living with his significant other in a private residence and was Mod I for ADLs/IADLs with use of rollator. Patient currently presents below baseline level of function with deficits in sitting/standing balance, decreased activity tolerance, pain in R residual limb, and increased need for assist from others for completion of self-care tasks, functional transfers, and mobility with use of RW. Patient would benefit from continued acute OT services to maximize safety and independence with self-care tasks in prep for safe d/c to next level of care with recommendation for CIR.      Follow Up Recommendations  CIR    Equipment Recommendations  Other (comment) (Please defer to next level of care)    Recommendations for Other Services Rehab consult     Precautions / Restrictions Precautions Precautions: Fall Restrictions Weight Bearing Restrictions: Yes RLE Weight Bearing: Non weight bearing      Mobility Bed Mobility Overal bed mobility: Needs Assistance Bed Mobility: Supine to Sit;Sit to Supine     Supine to sit: Min guard     General bed mobility comments: Min guard for supine to EOB +rail.    Transfers Overall transfer level: Needs assistance Equipment used: Rolling walker (2 wheeled) Transfers: Sit to/from [facility] Sit to Stand: Min assist Stand pivot transfers: Mod assist       General transfer comment: Sit to stand from EOB x4 trials with Min A and cues for hand placement. Stand-pivot to recliner with use of RW and Mod A with multimodal cues  for foot placement, technique, and walker management.    Balance Overall balance assessment: Needs assistance Sitting-balance support: Bilateral upper extremity supported;Feet supported (LLE supported) Sitting balance-Leahy Scale: Fair Sitting balance - Comments: Patient able to maintain sitting balance seated EOB while doffing/donning footwear. No overt LOB. Postural control: Posterior lean Standing balance support: Bilateral upper extremity supported;During functional activity Standing balance-Leahy Scale: Poor Standing balance comment: Heavy reliance on BUE on RW and occasional external assist.                           ADL either performed or assessed with clinical judgement   ADL Overall ADL's : Needs assistance/impaired     Grooming: Set up;Sitting               Lower Body Dressing: Moderate assistance;Sit to/from stand Lower Body Dressing Details (indicate cue type and reason): Able to doff L sock and don L shoe with set-up assist. Would likely require Mod A to thread BLE and hike LB clothing over hips in standing with assist to maintain standing balance. Toilet Transfer: Moderate assistance;RW Toilet Transfer Details (indicate cue type and reason): Simulated with stand-pivot transfer to recliner.                 Vision   Vision Assessment?: No apparent visual deficits     Perception     Praxis      Pertinent Vitals/Pain Pain Assessment: 0-10 Pain Score: 0-No pain Pain Intervention(s): Monitored during session;Premedicated before session     Hand Dominance  Extremity/Trunk Assessment Upper Extremity Assessment Upper Extremity Assessment: Defer to OT evaluation       Cervical / Trunk Assessment Cervical / Trunk Assessment: Kyphotic   Communication Communication Communication: No difficulties   Cognition Arousal/Alertness: Awake/alert Behavior During Therapy: WFL for tasks assessed/performed Overall Cognitive Status: Within  Functional Limits for tasks assessed                                     General Comments  A-fib with HR 90's-120's with activity. SpO2 >97% on RA.    Exercises Exercises: Amputee Amputee Exercises Chair Push Up: Strengthening;10 reps Other Exercises Other Exercises: Sit to stand x4   Shoulder Instructions      Home Living Family/patient expects to be discharged to:: Private residence Living Arrangements: Spouse/significant other (girlfriend) Available Help at Discharge: Family Type of Home: House Home Access: Stairs to enter [employer] of Steps: 1   Home Layout: One level;Able to live on main level with bedroom/bathroom     Bathroom Shower/Tub: Walk-in shower     Bathroom Accessibility: No (22 inches)   Home Equipment: Walker - 2 wheels;Walker - 4 wheels;Cane - single point          Prior Functioning/Environment Level of Independence: Independent with assistive device(s)        Comments: Mod I with ADLs/IADLs and use of rollator. Patient was driving.        OT Problem List: Decreased strength;Impaired balance (sitting and/or standing);Decreased knowledge of use of DME or AE;Pain      OT Treatment/Interventions: Self-care/ADL training;Therapeutic exercise;Energy conservation;DME and/or AE instruction;Therapeutic activities;Patient/family education;Balance training    OT Goals(Current goals can be found in the care plan section) Acute Rehab OT Goals Patient Stated Goal: To go to CIR OT Goal Formulation: With patient Time For Goal Achievement: 05/01/20 Potential to Achieve Goals: Good ADL Goals Pt Will Perform Grooming: with modified independence;sitting Pt Will Perform Lower Body Dressing: with supervision;sit to/from stand Pt Will Transfer to Toilet: with supervision;regular height toilet;stand pivot transfer Pt Will Perform Toileting - Clothing Manipulation and hygiene: with supervision;sitting/lateral leans;sit to/from stand Pt  Will Perform Tub/Shower Transfer: with min guard assist;Squat pivot transfer;tub bench Additional ADL Goal #1: Patient will demo increased dynamic sitting balance at EOB during LB dressing tasks evident by no LOB.  OT Frequency: Min 2X/week   Barriers to D/C:            Co-evaluation              AM-PAC OT "6 Clicks" Daily Activity     Outcome Measure Help from another person eating meals?: None Help from another person taking care of personal grooming?: A Little Help from another person toileting, which includes using toliet, bedpan, or urinal?: A Lot Help from another person bathing (including washing, rinsing, drying)?: A Lot Help from another person to put on and taking off regular upper body clothing?: A Little Help from another person to put on and taking off regular lower body clothing?: A Lot 6 Click Score: 16   End of Session Equipment Utilized During Treatment: Gait belt;Rolling walker  Activity Tolerance: Patient tolerated treatment well Patient left: in chair;with call bell/phone within reach  OT Visit Diagnosis: Unsteadiness on feet (R26.81);Muscle weakness (generalized) (M62.81);Pain Pain - Right/Left: Right Pain - part of body:  (Residual limb)                Time:  6754-4920 OT Time Calculation (min): 26 min Charges:  OT General Charges $OT Visit: 1 Visit OT Evaluation $OT Eval Moderate Complexity: 1 Mod OT Treatments $Therapeutic Exercise: 8-22 mins  Andoni Busch H. OTR/L Supplemental OT, Department of rehab services [phone]  Diontay Rosencrans R H. 04/17/2020, 11:06 AM

## 2020-04-17 NOTE — Progress Notes (Signed)
Nephrology Follow-Up Consult Note   Assessment/Recommendations: Jeremy [name] is a/an 65 y.o. male with a past medical history significant for ESRD on HD admitted for RLE amputation.   # ESRD: MWF schedule. 3.75hrs, 2K, 2.25Ca, Na 137, Bicarbonate 35, Dialyzer: F180. Tolerating treatment currently, will maintain MWF # Volume/ hypertension: Continue to challenge EDW as tolerated # Anemia of Chronic Kidney Disease: Currently receiving Epo 8000 units 3x weekly as an outpatient. Aranesp 72mcg 1/10 # Secondary Hyperparathyroidism/Hyperphosphatemia: Home calcitriol 60mcg MWF # Vascular access: LUE AVF with no issues # RLE ischemia. 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  Pain control and dressing changes per primary. Recommend avoiding morphine and using agents such as hydromorphone or fentanyl given that he is on dialysis # Mechanical aortic valve: on warfarin outpt, heparin bridge here, mgmt per primary # Fevers: mgmt per primary service. bcx drawn given h/o mechanical aortic valve  # Additional recommendations: - Dose all meds for creatinine clearance <10 ml/min  - Unless absolutely necessary, no MRIs with gadolinium.  - Implement save arm precautions. Prefer needle sticks in the dorsum of the hands or wrists. No blood pressure measurements in arm. - If blood transfusion is requested during hemodialysis sessions, please alert [name] prior to the session.   Recommendations conveyed to primary service.    [facility] 04/17/2020 1:26 PM  ___________________________________________________________  CC: ESRD  Interval History/Subjective:  Febrile 101.7. no complaints. Doing PT currently   Medications:  Current Facility-Administered Medications  Medication Dose Route Frequency Provider Last Rate Last Admin  . calcitRIOL (ROCALTROL) capsule 1 mcg  1 mcg Oral Q M,W,F Laurence Slate M, PA-C   1 mcg at 04/16/20 1055  . Chlorhexidine Gluconate Cloth 2 % PADS 6 each  6 each  Topical Q0600 Ulyses Amor, PA-C   6 each at 04/17/20 9379  . Darbepoetin Alfa (ARANESP) injection 60 mcg  60 mcg Intravenous Q Mon-HD Laurence Slate M, PA-C   60 mcg at 04/16/20 1034  . ezetimibe (ZETIA) tablet 10 mg  10 mg Oral Daily Laurence Slate M, PA-C   10 mg at 04/17/20 0857  . lamoTRIgine (LAMICTAL) tablet 50 mg  50 mg Oral BID Laurence Slate M, PA-C   50 mg at 04/17/20 0859  . methocarbamol (ROBAXIN) tablet 500 mg  500 mg Oral TID Ulyses Amor, PA-C   500 mg at 04/17/20 0240  . metoprolol tartrate (LOPRESSOR) injection 5 mg  5 mg Intravenous Q6H PRN Marty Heck, MD   5 mg at 04/15/20 1618  . metoprolol tartrate (LOPRESSOR) tablet 75 mg  75 mg Oral BID Ulyses Amor, PA-C   75 mg at 04/17/20 0858  . morphine 4 MG/ML injection 4 mg  4 mg Intravenous Q1H PRN Laurence Slate M, PA-C   4 mg at 04/16/20 1154  . oxyCODONE-acetaminophen (PERCOCET/ROXICET) 5-325 MG per tablet 1-2 tablet  1-2 tablet Oral Q4H PRN Ulyses Amor, PA-C   2 tablet at 04/17/20 0857  . pantoprazole (PROTONIX) EC tablet 40 mg  40 mg Oral Daily Laurence Slate M, PA-C   40 mg at 04/17/20 0857  . polyethylene glycol (MIRALAX / GLYCOLAX) packet 17 g  17 g Oral Daily Dagoberto Ligas, PA-C   17 g at 04/17/20 1210  . rosuvastatin (CRESTOR) tablet 40 mg  40 mg Oral q1800 Ulyses Amor, PA-C   40 mg at 04/16/20 1727  . sevelamer carbonate (RENVELA) tablet 3,200 mg  3,200 mg Oral TID WC Ulyses Amor,  PA-C   3,200 mg at 04/17/20 1201  . traZODone (DESYREL) tablet 25 mg  25 mg Oral QHS Laurence Slate M, PA-C   25 mg at 04/16/20 2254  . Warfarin - Pharmacist Dosing Inpatient   Does not apply q1600 Marty Heck, MD          Review of Systems: 10 systems reviewed and negative except per interval history/subjective  Physical Exam: Vitals:   04/17/20 0857 04/17/20 1100  BP: 122/65 106/64  Pulse: 100 90  Resp: 18 17  Temp: 100.1 F (37.8 C) 98.3 F (36.8 C)  SpO2: 97% 99%   No intake/output data  recorded.  Intake/Output Summary (Last 24 hours) at 04/17/2020 1326 Last data filed at 04/16/2020 2313 Gross per 24 hour  Intake 0 ml  Output --  Net 0 ml   Constitutional: well-appearing, no acute distress ENMT: ears and nose without scars or lesions, MMM CV: normal rate, no edema in lle Respiratory: bilateral chest rise, normal work of breathing Gastrointestinal: soft, non-tender, no palpable masses or hernias Skin: no visible lesions or rashes, RLE wound covered in dressing Ext: RLE absent below the knee, lue avf +b/t Psych: alert, judgement/insight appropriate, appropriate mood and affect   Test Results I personally reviewed new and old clinical labs and radiology tests Lab Results  Component Value Date   NA 138 04/17/2020   K 4.0 04/17/2020   CL 98 04/17/2020   CO2 28 04/17/2020   BUN 21 04/17/2020   CREATININE 6.00 (H) 04/17/2020   GLU 98 04/14/2019   CALCIUM 9.8 04/17/2020   ALBUMIN 2.6 (L) 04/17/2020   PHOS 4.4 04/17/2020

## 2020-04-17 NOTE — Progress Notes (Signed)
Inpatient Rehabilitation Admissions Coordinator  Inpatient rehab consult received. I met with patient at bedside for rehab assessment. We discussed goals and expectations a possible CIR admit. He  prefers CIR. I await medical readiness and bed availability for admit. Noted febrile overnight. I will follow up tomorrow.  Danne Baxter, RN, MSN Rehab Admissions Coordinator [phone] 04/17/2020 11:43 AM

## 2020-04-18 LAB — RENAL FUNCTION PANEL
Albumin: 2.5 g/dL — ABNORMAL LOW (ref 3.5–5.0)
Albumin: 2.5 g/dL — ABNORMAL LOW (ref 3.5–5.0)
Anion gap: 14 (ref 5–15)
Anion gap: 14 (ref 5–15)
BUN: 32 mg/dL — ABNORMAL HIGH (ref 8–23)
BUN: 38 mg/dL — ABNORMAL HIGH (ref 8–23)
CO2: 27 mmol/L (ref 22–32)
CO2: 26 mmol/L (ref 22–32)
Calcium: 9.8 mg/dL (ref 8.9–10.3)
Calcium: 9.8 mg/dL (ref 8.9–10.3)
Chloride: 95 mmol/L — ABNORMAL LOW (ref 98–111)
Chloride: 96 mmol/L — ABNORMAL LOW (ref 98–111)
Creatinine, Ser: 8.21 mg/dL — ABNORMAL HIGH (ref 0.61–1.24)
Creatinine, Ser: 8.86 mg/dL — ABNORMAL HIGH (ref 0.61–1.24)
GFR, Estimated: 7 mL/min — ABNORMAL LOW (ref >60)
GFR, Estimated: 6 mL/min — ABNORMAL LOW (ref >60)
Glucose, Bld: 103 mg/dL — ABNORMAL HIGH (ref 70–99)
Glucose, Bld: 97 mg/dL (ref 70–99)
Phosphorus: 5 mg/dL — ABNORMAL HIGH (ref 2.5–4.6)
Phosphorus: 4.9 mg/dL — ABNORMAL HIGH (ref 2.5–4.6)
Potassium: 4 mmol/L (ref 3.5–5.1)
Potassium: 4.1 mmol/L (ref 3.5–5.1)
Sodium: 136 mmol/L (ref 135–145)
Sodium: 136 mmol/L (ref 135–145)

## 2020-04-18 LAB — CBC
HCT: 26.4 % — ABNORMAL LOW (ref 39.0–52.0)
Hemoglobin: 8 g/dL — ABNORMAL LOW (ref 13.0–17.0)
MCH: 34.2 pg — ABNORMAL HIGH (ref 26.0–34.0)
MCHC: 30.3 g/dL (ref 30.0–36.0)
MCV: 112.8 fL — ABNORMAL HIGH (ref 80.0–100.0)
Platelets: 106 K/uL — ABNORMAL LOW (ref 150–400)
RBC: 2.34 MIL/uL — ABNORMAL LOW (ref 4.22–5.81)
RDW: 14.8 % (ref 11.5–15.5)
WBC: 6 K/uL (ref 4.0–10.5)
nRBC: 0 % (ref 0.0–0.2)

## 2020-04-18 LAB — PROTIME-INR
INR: 4.7 (ref 0.8–1.2)
Prothrombin Time: 43 seconds — ABNORMAL HIGH (ref 11.4–15.2)

## 2020-04-18 LAB — MAGNESIUM: Magnesium: 2.1 mg/dL (ref 1.7–2.4)

## 2020-04-18 MED ORDER — ACETAMINOPHEN 500 MG PO TABS
500.0000 mg | ORAL_TABLET | ORAL | Status: DC | PRN
  Administered 2020-04-18 – 2020-04-20 (×3): 500 mg via ORAL
  Filled 2020-04-18 (×4): qty 1

## 2020-04-18 MED ORDER — PHYTONADIONE 1 MG/0.5 ML ORAL SOLUTION
1.0000 mg | Freq: Once | ORAL | Status: DC
  Filled 2020-04-18: qty 0.5

## 2020-04-18 MED ORDER — PHYTONADIONE 1 MG/0.5 ML ORAL SOLUTION
1.0000 mg | Freq: Once | ORAL | Status: AC
  Administered 2020-04-18: 1 mg via ORAL
  Filled 2020-04-18: qty 0.5

## 2020-04-18 NOTE — Progress Notes (Signed)
Tylenol given at 2148. Temperature retaken at 2240 - 102.4.  Patient states that he feels like a barbeque. Ice packs applied to axillaries. Will continue to monitor

## 2020-04-18 NOTE — Progress Notes (Signed)
The pt has a Below Knee Amputation The patient requires a prosthesis to improve their mobility and has the ability to function with the prosthesis to maintain a healthy lifestyle and perform ADLs. The patient is a new amputee and thus he does not have a prosthesis. The patient verbally communicates a strong desire to get a prosthesis. The patient is currently using a walker to hop around and a wheelchair for mobility aids and is not expected to do so with the new prosthesis.  Patient is a functional K2 level or greater ambulator. Physical therapy is conducting single leg sit and stands with patient, and he is exhibiting strength, motivation, potential and ability to ambulate with a walking aid safely and independently.

## 2020-04-18 NOTE — Progress Notes (Signed)
Pt received from HD. VSS. Call light in reach.  Edras Wilford, RN  

## 2020-04-18 NOTE — Progress Notes (Signed)
Patient stated to this RN and NT that, "if anything happens, let him go". This RN advised patient that if that was his wish, then he needed to talk to MD in the morning on rounding and speak with his family. Patient understood

## 2020-04-18 NOTE — Progress Notes (Signed)
Notified Dr. Donnetta Hutching of patient's temperature 103.0. Tylenol order for 500 mg PRN q 4 for fever.

## 2020-04-18 NOTE — Progress Notes (Signed)
Orthopedic Tech Progress Note Patient Details:  Jeremy [name] 06/12/1955 [id number] Called order into [facility] Patient ID: Jeremy [name], male   DOB: 05/11/1955, 65 y.o.   MRN: [id number]   [name] 04/18/2020, 6:30 PM

## 2020-04-18 NOTE — Progress Notes (Addendum)
ANTICOAGULATION CONSULT NOTE - Follow Up Consult  Pharmacy Consult for  [place] Indication: Afib/AVR  Labs: Recent Labs    04/15/20 2040 04/16/20 0147 04/17/20 0218 04/18/20 0140 04/18/20 0531  HGB  --  8.2* 8.5*  --   --   HCT  --  27.4* 27.4*  --   --   PLT  --  102* 110*  --   --   LABPROT  --  26.1* 33.4*  --  43.0*  INR  --  2.5* 3.4*  --  4.7*  HEPARINUNFRC 0.25* 0.28* <0.10*  --   --   CREATININE  --  9.10* 6.00* 8.21*  --     Assessment: 65 yo male s/p R BKA. He is on warfarin PTA for mech AVR/afib and this was restarted 1/10 (he is no longer on IV heparin). INR was 10.2 on 1/3 and he is noted s/p FFP since then.  -INR up to 4.7 (only 1 dose of 0.5mg  given this admit on 1/10) -he may be vitamin K deficient -Will given low dose vitamin K (discussed with Risa Grill, PA)   Goal of Therapy:  INR goal 2.5 to 3.5   Plan:  -hold warfarin today -Vitamin K 1mg  po (only 0.5mg  warfarin given thus far on 1/10. Giving a small dose to avoid further INR climb since s/p OR on 1/8 to help decrease risk of bleed) -Daily PT/INR  Hildred Laser, PharmD Clinical Pharmacist **Pharmacist phone directory can now be found on [email or web address] ([email or web address]).  Listed under [place].

## 2020-04-18 NOTE — Progress Notes (Signed)
CRITICAL VALUE ALERT  Critical Value:  INR 4.72  Date & Time Notied:  04/18/20 5110

## 2020-04-18 NOTE — Progress Notes (Signed)
Notified by CCMD that patient 18 beat run vtach at 0343. Notified on call Dr. Scot Dock. Order for EKG stat and add on magnesium. Patient denies any symptoms. Called lab and lab will add it on. Will continue to monitor

## 2020-04-18 NOTE — Plan of Care (Signed)
  Problem: Clinical Measurements: Goal: Respiratory complications will improve Outcome: Progressing Goal: Cardiovascular complication will be avoided Outcome: Progressing   

## 2020-04-18 NOTE — Progress Notes (Signed)
Nephrology Follow-Up Consult Note   Assessment/Recommendations: Jeremy [name] is a/an 65 y.o. male with a past medical history significant for ESRD on HD admitted for RLE amputation.   # ESRD: MWF schedule, outpatient unit: [place]. 3.75hrs, 2K, 2.25Ca, Na 137, Bicarbonate 35, Dialyzer: F180. Tolerating treatment currently, will maintain MWF # Volume/ hypertension: Continue to challenge EDW as tolerated # Anemia of Chronic Kidney Disease: Currently receiving Epo 8000 units 3x weekly as an outpatient. Aranesp 31mcg 1/10 # Secondary Hyperparathyroidism/Hyperphosphatemia: Home calcitriol 43mcg MWF # Vascular access: LUE AVF with no issues # RLE ischemia. 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  Pain control and dressing changes per primary. Recommend avoiding morphine and using agents such as hydromorphone or fentanyl given that he is on dialysis # Mechanical aortic valve: on warfarin outpt, heparin bridge here, mgmt per primary # Fevers: mgmt per primary service. bcx drawn given h/o mechanical aortic valve. Possible cavitary appearing structure on cxr, possible CT chest pending  # Additional recommendations: - Dose all meds for creatinine clearance <10 ml/min  - Unless absolutely necessary, no MRIs with gadolinium.  - Implement save arm precautions. Prefer needle sticks in the dorsum of the hands or wrists. No blood pressure measurements in arm. - If blood transfusion is requested during hemodialysis sessions, please alert [name] prior to the session.    [facility] 04/18/2020 11:58 AM  ___________________________________________________________  CC: ESRD  Interval History/Subjective:  Seen on hd, no complaints. ufg 2L.   Medications:  Current Facility-Administered Medications  Medication Dose Route Frequency Provider Last Rate Last Admin  . calcitRIOL (ROCALTROL) capsule 1 mcg  1 mcg Oral Q M,W,F Laurence Slate M, PA-C   1 mcg at 04/16/20 1055  . Chlorhexidine  Gluconate Cloth 2 % PADS 6 each  6 each Topical Q0600 Ulyses Amor, PA-C   6 each at 04/18/20 0459  . Darbepoetin Alfa (ARANESP) injection 60 mcg  60 mcg Intravenous Q Mon-HD Laurence Slate M, PA-C   60 mcg at 04/16/20 1034  . ezetimibe (ZETIA) tablet 10 mg  10 mg Oral Daily Laurence Slate M, PA-C   10 mg at 04/17/20 0857  . lamoTRIgine (LAMICTAL) tablet 50 mg  50 mg Oral BID Laurence Slate M, PA-C   50 mg at 04/17/20 2216  . methocarbamol (ROBAXIN) tablet 500 mg  500 mg Oral TID Ulyses Amor, PA-C   500 mg at 04/17/20 2218  . metoprolol tartrate (LOPRESSOR) injection 5 mg  5 mg Intravenous Q6H PRN Marty Heck, MD   5 mg at 04/15/20 1618  . metoprolol tartrate (LOPRESSOR) tablet 75 mg  75 mg Oral BID Ulyses Amor, PA-C   75 mg at 04/17/20 2214  . morphine 4 MG/ML injection 4 mg  4 mg Intravenous Q1H PRN Laurence Slate M, PA-C   4 mg at 04/16/20 1154  . oxyCODONE-acetaminophen (PERCOCET/ROXICET) 5-325 MG per tablet 1-2 tablet  1-2 tablet Oral Q4H PRN Ulyses Amor, PA-C   2 tablet at 04/18/20 1003  . pantoprazole (PROTONIX) EC tablet 40 mg  40 mg Oral Daily Laurence Slate M, PA-C   40 mg at 04/17/20 0857  . phytonadione (VITAMIN K) oral solution 1 mg/0.5 mL  1 mg Oral Once Kris Mouton, RPH      . polyethylene glycol (MIRALAX / GLYCOLAX) packet 17 g  17 g Oral Daily Dagoberto Ligas, PA-C   17 g at 04/17/20 1210  . rosuvastatin (CRESTOR) tablet 40 mg  40 mg  Oral q1800 Ulyses Amor, PA-C   40 mg at 04/17/20 1640  . sevelamer carbonate (RENVELA) tablet 3,200 mg  3,200 mg Oral TID WC Laurence Slate M, PA-C   3,200 mg at 04/17/20 1639  . traZODone (DESYREL) tablet 25 mg  25 mg Oral QHS Laurence Slate M, PA-C   25 mg at 04/17/20 2216  . Warfarin - Pharmacist Dosing Inpatient   Does not apply q1600 Marty Heck, MD          Review of Systems: 10 systems reviewed and negative except per interval history/subjective  Physical Exam: Vitals:   04/18/20 1115 04/18/20 1130  BP:  111/66 112/62  Pulse: (!) 107 90  Resp:  18  Temp:    SpO2:  100%   No intake/output data recorded.  Intake/Output Summary (Last 24 hours) at 04/18/2020 1158 Last data filed at 04/17/2020 2341 Gross per 24 hour  Intake 0 ml  Output --  Net 0 ml   Constitutional: well-appearing, no acute distress ENMT: ears and nose without scars or lesions, MMM CV: normal rate, no edema in lle Respiratory: bilateral chest rise, normal work of breathing Gastrointestinal: soft, non-tender, no palpable masses or hernias Skin: no visible lesions or rashes, RLE wound covered in dressing Ext: RLE absent below the knee, lue avf +b/t Psych: alert, judgement/insight appropriate, appropriate mood and affect   Test Results I personally reviewed new and old clinical labs and radiology tests Lab Results  Component Value Date   NA 136 04/18/2020   K 4.0 04/18/2020   CL 95 (L) 04/18/2020   CO2 27 04/18/2020   BUN 32 (H) 04/18/2020   CREATININE 8.21 (H) 04/18/2020   GLU 98 04/14/2019   CALCIUM 9.8 04/18/2020   ALBUMIN 2.5 (L) 04/18/2020   PHOS 5.0 (H) 04/18/2020

## 2020-04-18 NOTE — Progress Notes (Addendum)
Progress Note    04/18/2020 8:01 AM 4 Days Post-Op  Subjective:  Awake and alert in NAD. Denies CP, SOB or nausea   Vitals:   04/18/20 0352 04/18/20 0754  BP:  139/78  Pulse:  (!) 116  Resp:  13  Temp: 97.9 F (36.6 C) 99.2 F (37.3 C)  SpO2: 100% 99%    Physical Exam: Cardiac:  Slightly elevated rate, reg rhthym Lungs:  nonlabored Incisions:  Well approximated. Small amount of strike through onto dressing. No bleeding or drainage Extremities:  Flaps are warm. Mild ischemia of skin edges/posterior flap  (see photo)        CBC    Component Value Date/Time   WBC 6.1 04/17/2020 0218   RBC 2.45 (L) 04/17/2020 0218   HGB 8.5 (L) 04/17/2020 0218   HGB 10.0 (L) 08/30/2014 1013   HCT 27.4 (L) 04/17/2020 0218   HCT 32.2 (L) 08/30/2014 1013   PLT 110 (L) 04/17/2020 0218   PLT 125 (L) 08/30/2014 1013   MCV 111.8 (H) 04/17/2020 0218   MCV 88.2 09/16/2014 1041   MCV 89 08/30/2014 1013   MCH 34.7 (H) 04/17/2020 0218   MCHC 31.0 04/17/2020 0218   RDW 15.0 04/17/2020 0218   RDW 14.0 08/30/2014 1013   LYMPHSABS 1.2 10/03/2019 0348   LYMPHSABS 0.8 08/30/2014 1013   MONOABS 0.4 10/03/2019 0348   EOSABS 0.1 10/03/2019 0348   EOSABS 0.1 08/30/2014 1013   BASOSABS 0.0 10/03/2019 0348   BASOSABS 0.0 08/30/2014 1013    BMET    Component Value Date/Time   NA 136 04/18/2020 0140   NA 143 04/14/2019 0000   K 4.0 04/18/2020 0140   CL 95 (L) 04/18/2020 0140   CO2 27 04/18/2020 0140   GLUCOSE 103 (H) 04/18/2020 0140   BUN 32 (H) 04/18/2020 0140   BUN 20 04/14/2019 0000   CREATININE 8.21 (H) 04/18/2020 0140   CALCIUM 9.8 04/18/2020 0140   CALCIUM 9.2 12/14/2015 1059   GFRNONAA 7 (L) 04/18/2020 0140   GFRAA 8 (L) 12/23/2019 0858     Intake/Output Summary (Last 24 hours) at 04/18/2020 0801 Last data filed at 04/17/2020 2341 Gross per 24 hour  Intake 0 ml  Output --  Net 0 ml    HOSPITAL MEDICATIONS Scheduled Meds: . calcitRIOL  1 mcg Oral Q M,W,F  .  Chlorhexidine Gluconate Cloth  6 each Topical Q0600  . darbepoetin (ARANESP) injection - DIALYSIS  60 mcg Intravenous Q Mon-HD  . ezetimibe  10 mg Oral Daily  . lamoTRIgine  50 mg Oral BID  . methocarbamol  500 mg Oral TID  . metoprolol tartrate  75 mg Oral BID  . pantoprazole  40 mg Oral Daily  . polyethylene glycol  17 g Oral Daily  . rosuvastatin  40 mg Oral q1800  . sevelamer carbonate  3,200 mg Oral TID WC  . traZODone  25 mg Oral QHS  . Warfarin - Pharmacist Dosing Inpatient   Does not apply q1600   Continuous Infusions: PRN Meds:.metoprolol tartrate, morphine injection, oxyCODONE-acetaminophen  Assessment: POD 4 65 y.o. male is s/p R BKA. No bleeding from incision.Dressing replaced.  Low grade temp this morning. Normal WBCs yesterday  BHA:[id number]: There is persistent of indeterminate cavitary appearing structures within the left midlung and left lower lung. Consider more definitive characterization with CT of the chest.  ESRD on HD: MWF schedule via LUE AVF.  No reports of post-dialysis bleeding from fistula  Mechanical aortic valve: INR today is  4.7; remains supra-therapeutic despite off warfarin; ? Vitamin K deficient. No bleeding form PIV/other sites  Plan - Monitor posterior flap.  -discussed with Bjorn Loser, pharmacist: Will give small dose of vitamin K times one dose this am and continue to trend INR - will discuss with Dr. Carlis Abbott proceeding with CT of chest -continue PT/OT, consult [place], PA-C [facility] [phone] 04/18/2020  8:01 AM   I have seen and evaluated the patient. I agree with the PA note as documented above. Seen in dialysis.  Will continue to monitor right BKA.Picture above from today.  Marty Heck, MD [facility] [place] Office: [phone]

## 2020-04-18 NOTE — Progress Notes (Signed)
EKG completed. Waiting on magnesium result. Upon assessment, noted that patient has JVD on left side.

## 2020-04-19 ENCOUNTER — Inpatient Hospital Stay (HOSPITAL_COMMUNITY): Payer: Medicare Other

## 2020-04-19 LAB — RENAL FUNCTION PANEL
Albumin: 2.5 g/dL — ABNORMAL LOW (ref 3.5–5.0)
Anion gap: 15 (ref 5–15)
BUN: 18 mg/dL (ref 8–23)
CO2: 24 mmol/L (ref 22–32)
Calcium: 9.7 mg/dL (ref 8.9–10.3)
Chloride: 99 mmol/L (ref 98–111)
Creatinine, Ser: 5.17 mg/dL — ABNORMAL HIGH (ref 0.61–1.24)
GFR, Estimated: 12 mL/min — ABNORMAL LOW (ref >60)
Glucose, Bld: 88 mg/dL (ref 70–99)
Phosphorus: 4.3 mg/dL (ref 2.5–4.6)
Potassium: 4 mmol/L (ref 3.5–5.1)
Sodium: 138 mmol/L (ref 135–145)

## 2020-04-19 LAB — CBC
HCT: 27.3 % — ABNORMAL LOW (ref 39.0–52.0)
Hemoglobin: 8.4 g/dL — ABNORMAL LOW (ref 13.0–17.0)
MCH: 34.6 pg — ABNORMAL HIGH (ref 26.0–34.0)
MCHC: 30.8 g/dL (ref 30.0–36.0)
MCV: 112.3 fL — ABNORMAL HIGH (ref 80.0–100.0)
Platelets: 113 10*3/uL — ABNORMAL LOW (ref 150–400)
RBC: 2.43 MIL/uL — ABNORMAL LOW (ref 4.22–5.81)
RDW: 14.7 % (ref 11.5–15.5)
WBC: 6.2 10*3/uL (ref 4.0–10.5)
nRBC: 0 % (ref 0.0–0.2)

## 2020-04-19 LAB — PROTIME-INR
INR: 3.1 — ABNORMAL HIGH (ref 0.8–1.2)
Prothrombin Time: 31.2 seconds — ABNORMAL HIGH (ref 11.4–15.2)

## 2020-04-19 MED ORDER — WARFARIN SODIUM 1 MG PO TABS
1.0000 mg | ORAL_TABLET | Freq: Once | ORAL | Status: AC
  Administered 2020-04-19: 1 mg via ORAL
  Filled 2020-04-19: qty 1

## 2020-04-19 MED ORDER — SORBITOL 70 % SOLN
30.0000 mL | Freq: Once | Status: AC
  Administered 2020-04-19: 30 mL via ORAL
  Filled 2020-04-19: qty 30

## 2020-04-19 MED ORDER — FLEET ENEMA 7-19 GM/118ML RE ENEM
1.0000 | ENEMA | Freq: Once | RECTAL | Status: AC
  Administered 2020-04-19: 1 via RECTAL
  Filled 2020-04-19: qty 1

## 2020-04-19 NOTE — Progress Notes (Signed)
Mobility Specialist - Progress Note   04/19/20 1604  Mobility  Activity Transferred to/from [facility]  Level of Assistance Moderate assist, patient does 50-74%  Assistive Device BSC  Mobility Response Tolerated well  Mobility performed by Mobility specialist;Nurse  $Mobility charge 1 Mobility   Mod assist to stand and pivot from bed to [facility] in order to try to have a BM. Pt left w/ RN in room. VSS.   Pricilla Handler Mobility Specialist Mobility Specialist Phone: [phone]

## 2020-04-19 NOTE — Progress Notes (Signed)
ANTICOAGULATION CONSULT NOTE - Follow Up Consult  Pharmacy Consult for  [place] Indication: Afib/AVR  Labs: Recent Labs    04/17/20 0218 04/18/20 0140 04/18/20 0531 04/18/20 1128 04/19/20 0435  HGB 8.5*  --   --  8.0* 8.4*  HCT 27.4*  --   --  26.4* 27.3*  PLT 110*  --   --  106* 113*  LABPROT 33.4*  --  43.0*  --  31.2*  INR 3.4*  --  4.7*  --  3.1*  HEPARINUNFRC <0.10*  --   --   --   --   CREATININE 6.00* 8.21*  --  8.86* 5.17*    Assessment: 65 yo male s/p R BKA. He is on warfarin PTA for mech AVR/afib and this was restarted 1/10 (he is no longer on IV heparin). INR was 10.2 on 1/3 and he is noted s/p FFP since then.  -INR 4.7>> 3.1 (only 1 dose of 0.5mg  given this admit on 1/10) -s/p 1mg  po vitamin K on 1/12 -Hg stable   Goal of Therapy:  INR goal 2.5 to 3.5   Plan:  -Warfarin 1mg  po today -Daily PT/INR  Hildred Laser, PharmD Clinical Pharmacist **Pharmacist phone directory can now be found on [email or web address] ([email or web address]).  Listed under [place].

## 2020-04-19 NOTE — Progress Notes (Signed)
Inpatient Rehabilitation Admissions Coordinator  Noted febrile. I will continue to follow his progress to assist with planning dispo options.  Danne Baxter, RN, MSN Rehab Admissions Coordinator [phone] 04/19/2020 11:24 AM

## 2020-04-19 NOTE — Progress Notes (Signed)
Nephrology Follow-Up Consult Note   Assessment/Recommendations: Jeremy [name] is a/an 65 y.o. male with a past medical history significant for ESRD on HD admitted for RLE amputation.   # ESRD: MWF schedule, outpatient unit: [facility] (follows w/ Dr. Orson Ape). 3.75hrs, 2K, 2.25Ca, Na 137, Bicarbonate 35, Dialyzer: F180. Tolerating treatment currently, will maintain MWF # Volume/ hypertension: Continue to challenge EDW as tolerated # Anemia of Chronic Kidney Disease: Currently receiving Epo 8000 units 3x weekly as an outpatient. Aranesp 43mcg 1/10 # Secondary Hyperparathyroidism/Hyperphosphatemia: Home calcitriol 79mcg MWF # Vascular access: LUE AVF with no issues # RLE ischemia. 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  Pain control and dressing changes per primary. Recommend avoiding morphine and using agents such as hydromorphone or fentanyl given that he is on dialysis # Mechanical aortic valve: on warfarin outpt, heparin bridge here, mgmt per primary # Fevers: mgmt per primary service. bcx drawn given h/o mechanical aortic valve. Possible cavitary appearing structure on cxr, possible CT chest pending  # Additional recommendations: - Dose all meds for creatinine clearance <10 ml/min  - Unless absolutely necessary, no MRIs with gadolinium.  - Implement save arm precautions. Prefer needle sticks in the dorsum of the hands or wrists. No blood pressure measurements in arm. - If blood transfusion is requested during hemodialysis sessions, please alert [name] prior to the session.    [facility] 04/19/2020 12:19 PM  ___________________________________________________________  CC: ESRD  Interval History/Subjective:  No acute events. Still having fevers but feels good overall. Net uf 2006cc yesterday, tolerated HD.   Medications:  Current Facility-Administered Medications  Medication Dose Route Frequency Provider Last Rate Last Admin  . acetaminophen (TYLENOL) tablet  500 mg  500 mg Oral Q4H PRN Rosetta Posner, MD   500 mg at 04/18/20 2138  . calcitRIOL (ROCALTROL) capsule 1 mcg  1 mcg Oral Q M,W,F Ulyses Amor, PA-C   1 mcg at 04/18/20 1601  . Chlorhexidine Gluconate Cloth 2 % PADS 6 each  6 each Topical Q0600 Ulyses Amor, PA-C   6 each at 04/19/20 0503  . Darbepoetin Alfa (ARANESP) injection 60 mcg  60 mcg Intravenous Q Mon-HD Laurence Slate M, PA-C   60 mcg at 04/16/20 1034  . ezetimibe (ZETIA) tablet 10 mg  10 mg Oral Daily Laurence Slate M, PA-C   10 mg at 04/19/20 0847  . lamoTRIgine (LAMICTAL) tablet 50 mg  50 mg Oral BID Laurence Slate M, PA-C   50 mg at 04/19/20 0847  . methocarbamol (ROBAXIN) tablet 500 mg  500 mg Oral TID Ulyses Amor, PA-C   500 mg at 04/19/20 0847  . metoprolol tartrate (LOPRESSOR) injection 5 mg  5 mg Intravenous Q6H PRN Marty Heck, MD   5 mg at 04/15/20 1618  . metoprolol tartrate (LOPRESSOR) tablet 75 mg  75 mg Oral BID Laurence Slate M, PA-C   75 mg at 04/19/20 0847  . morphine 4 MG/ML injection 4 mg  4 mg Intravenous Q1H PRN Laurence Slate M, PA-C   4 mg at 04/16/20 1154  . oxyCODONE-acetaminophen (PERCOCET/ROXICET) 5-325 MG per tablet 1-2 tablet  1-2 tablet Oral Q4H PRN Ulyses Amor, PA-C   2 tablet at 04/18/20 1003  . pantoprazole (PROTONIX) EC tablet 40 mg  40 mg Oral Daily Laurence Slate M, PA-C   40 mg at 04/19/20 0848  . polyethylene glycol (MIRALAX / GLYCOLAX) packet 17 g  17 g Oral Daily Dagoberto Ligas, PA-C   17  g at 04/19/20 0848  . rosuvastatin (CRESTOR) tablet 40 mg  40 mg Oral q1800 Ulyses Amor, PA-C   40 mg at 04/18/20 1831  . sevelamer carbonate (RENVELA) tablet 3,200 mg  3,200 mg Oral TID WC Ulyses Amor, PA-C   3,200 mg at 04/19/20 0848  . traZODone (DESYREL) tablet 25 mg  25 mg Oral QHS Laurence Slate M, PA-C   25 mg at 04/17/20 2216  . warfarin (COUMADIN) tablet 1 mg  1 mg Oral ONCE-1600 Kris Mouton, [facility]      . Warfarin - Pharmacist Dosing Inpatient   Does not apply q1600 Marty Heck, MD          Review of Systems: 10 systems reviewed and negative except per interval history/subjective  Physical Exam: Vitals:   04/19/20 0839 04/19/20 1200  BP: 129/75 124/77  Pulse: 93 90  Resp: 14 15  Temp: 98.6 F (37 C) 99.6 F (37.6 C)  SpO2: 100% 97%   No intake/output data recorded.  Intake/Output Summary (Last 24 hours) at 04/19/2020 1219 Last data filed at 04/18/2020 2252 Gross per 24 hour  Intake 0 ml  Output 2006 ml  Net -2006 ml   Constitutional: well-appearing, no acute distress ENMT: ears and nose without scars or lesions, MMM CV: normal rate, no edema in lle Respiratory: bilateral chest rise, normal work of breathing Gastrointestinal: soft, non-tender, no palpable masses or hernias Skin: no visible lesions or rashes, RLE wound covered in dressing Ext: RLE absent below the knee, lue avf +b/t Psych: alert, judgement/insight appropriate, appropriate mood and affect   Test Results I personally reviewed new and old clinical labs and radiology tests Lab Results  Component Value Date   NA 138 04/19/2020   K 4.0 04/19/2020   CL 99 04/19/2020   CO2 24 04/19/2020   BUN 18 04/19/2020   CREATININE 5.17 (H) 04/19/2020   GLU 98 04/14/2019   CALCIUM 9.7 04/19/2020   ALBUMIN 2.5 (L) 04/19/2020   PHOS 4.3 04/19/2020

## 2020-04-19 NOTE — Progress Notes (Signed)
Occupational Therapy Treatment Patient Details Name: Jeremy [name] MRN: [id number] DOB: April 02, 1956 Today's Date: 04/19/2020    History of present illness Pt is a 65 y.o. 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   OT comments  Pt progressing towards acute OT goals. Worked on functional transfers with pt completing at mod A level utilizing rw. Pt endorsing BUE weakness. Provided theraband and written HEP for BUE general strengthening exercises. D/c plan remains appropriate.    Follow Up Recommendations  CIR    Equipment Recommendations  Other (comment) (TBD next venue)    Recommendations for Other Services      Precautions / Restrictions Precautions Precautions: Fall Precaution Comments: R BKA Restrictions Weight Bearing Restrictions: Yes RLE Weight Bearing: Non weight bearing       Mobility Bed Mobility Overal bed mobility: Needs Assistance Bed Mobility: Supine to Sit;Sit to Supine     Supine to sit: Min guard Sit to supine: Min guard   General bed mobility comments: min guard for safety  Transfers Overall transfer level: Needs assistance Equipment used: Rolling walker (2 wheeled) Transfers: Sit to/from [facility] Sit to Stand: Mod assist Stand pivot transfers: Mod assist       General transfer comment: Mod assist to steady>powerup. to/from EOB. Mod A to steady while taking pivotal steps with rw. Pt endorses BUE weakness.    Balance Overall balance assessment: Needs assistance Sitting-balance support: Bilateral upper extremity supported;Feet supported Sitting balance-Leahy Scale: Fair     Standing balance support: Bilateral upper extremity supported;During functional activity Standing balance-Leahy Scale: Poor                             ADL either performed or  assessed with clinical judgement   ADL Overall ADL's : Needs assistance/impaired                     Lower Body Dressing: Moderate assistance;Sit to/from stand   Toilet Transfer: Moderate assistance;Stand-pivot             General ADL Comments: Pt recently back to bed after sitting up in recliner and PT session but agreeable to OT session. completed bed mobility 3x sit<>stands EOB, pivotal steps to simunlate toilet transfer to [facility].     Vision       Perception     Praxis      Cognition Arousal/Alertness: Awake/alert Behavior During Therapy: WFL for tasks assessed/performed Overall Cognitive Status: Within Functional Limits for tasks assessed                                          Exercises Exercises: Other exercises Other Exercises Other Exercises: Issued level 1 theraband and provided written HEP for general UB strengthening.   Shoulder Instructions       General Comments      Pertinent Vitals/ Pain       Pain Assessment: Faces Pain Score: 6  Faces Pain Scale: Hurts even more Pain Location: right residual limb Pain Descriptors / Indicators: Aching Pain Intervention(s): Limited activity within patient's tolerance;Monitored during session;Repositioned  Home Living  Prior Functioning/Environment              Frequency  Min 2X/week        Progress Toward Goals  OT Goals(current goals can now be found in the care plan section)  Progress towards OT goals: Progressing toward goals  Acute Rehab OT Goals Patient Stated Goal: To go to CIR OT Goal Formulation: With patient Time For Goal Achievement: 05/01/20 Potential to Achieve Goals: Good ADL Goals Pt Will Perform Grooming: with modified independence;sitting Pt Will Perform Lower Body Dressing: with supervision;sit to/from stand Pt Will Transfer to Toilet: with supervision;regular height toilet;stand pivot  transfer Pt Will Perform Toileting - Clothing Manipulation and hygiene: with supervision;sitting/lateral leans;sit to/from stand Pt Will Perform Tub/Shower Transfer: with min guard assist;Squat pivot transfer;tub bench Pt/caregiver will Perform Home Exercise Program: Increased strength;Both right and left upper extremity;With theraband;With written HEP provided;Independently Additional ADL Goal #1: Patient will demo increased dynamic sitting balance at EOB during LB dressing tasks evident by no LOB.  Plan Discharge plan remains appropriate    Co-evaluation                 AM-PAC OT "6 Clicks" Daily Activity     Outcome Measure   Help from another person eating meals?: None Help from another person taking care of personal grooming?: A Little Help from another person toileting, which includes using toliet, bedpan, or urinal?: A Lot Help from another person bathing (including washing, rinsing, drying)?: A Lot Help from another person to put on and taking off regular upper body clothing?: A Little Help from another person to put on and taking off regular lower body clothing?: A Lot 6 Click Score: 16    End of Session Equipment Utilized During Treatment: Gait belt;Rolling walker  OT Visit Diagnosis: Unsteadiness on feet (R26.81);Muscle weakness (generalized) (M62.81);Pain   Activity Tolerance Patient tolerated treatment well   Patient Left in bed;with call bell/phone within reach   Nurse Communication          Time: 1212-1234 OT Time Calculation (min): 22 min  Charges: OT General Charges $OT Visit: 1 Visit OT Treatments $Self Care/Home Management : 8-22 mins  Tyrone Schimke, OT Acute Rehabilitation Services Pager: [phone] Office: [phone]    Hortencia Pilar 04/19/2020, 1:47 PM

## 2020-04-19 NOTE — Progress Notes (Signed)
Physical Therapy Treatment Patient Details Name: JOHNWESLEY [name] MRN: [id number] DOB: Sep 28, 1955 Today's Date: 04/19/2020    History of Present Illness Pt is a 65 y.o. 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    PT Comments    Pt supine in bed on arrival.  Pt voiced need to move to bedside commode for BM.  After transfer he was only able to produce flatulence and no BM.  Pt performed short bout of gt training in room and require mod assistance overall this session.  Continue to recommend aggressive rehab in a post acute setting.      Follow Up Recommendations  CIR     Equipment Recommendations  3in1 (PT);Wheelchair (measurements PT);Wheelchair cushion (measurements PT)    Recommendations for Other Services       Precautions / Restrictions Precautions Precautions: Fall Precaution Comments: R BKA Restrictions Weight Bearing Restrictions: Yes RLE Weight Bearing: Non weight bearing    Mobility  Bed Mobility Overal bed mobility: Needs Assistance Bed Mobility: Supine to Sit     Supine to sit: Min guard     General bed mobility comments: Min guard to move to edge of bed.  Transfers Overall transfer level: Needs assistance Equipment used: Rolling walker (2 wheeled) Transfers: Sit to/from Stand Sit to Stand: Mod assist Stand pivot transfers: Mod assist       General transfer comment: Cues for hand placement to and from seated surface.  Pt required mod assistance to boost into standing.  Pt did have a smoother transition from [facility] when pushing from B arm rests.  Ambulation/Gait Ambulation/Gait assistance: Mod assist Gait Distance (Feet): 4 Feet (+ 10 ft) Assistive device: Rolling walker (2 wheeled) Gait Pattern/deviations: Step-to pattern;Trunk flexed;Antalgic     General Gait Details: Cues for hip extension  and upper trunk control this session.  Pt fatigues quickly with short bout of gt training in room.   Stairs             Wheelchair Mobility    Modified Rankin (Stroke Patients Only)       Balance Overall balance assessment: Needs assistance Sitting-balance support: Bilateral upper extremity supported;Feet supported Sitting balance-Leahy Scale: Fair       Standing balance-Leahy Scale: Poor                              Cognition Arousal/Alertness: Awake/alert Behavior During Therapy: WFL for tasks assessed/performed Overall Cognitive Status: Within Functional Limits for tasks assessed                                        Exercises      General Comments        Pertinent Vitals/Pain Pain Assessment: 0-10 Pain Score: 6  Pain Location: right residual limb Pain Descriptors / Indicators: Aching Pain Intervention(s): Monitored during session;Repositioned    Home Living                      Prior Function            PT Goals (current goals can now be found in the care plan section) Acute Rehab PT Goals Patient Stated Goal: To go to CIR Potential to Achieve Goals:  Good Progress towards PT goals: Progressing toward goals    Frequency    Min 3X/week      PT Plan Current plan remains appropriate    Co-evaluation              AM-PAC PT "6 Clicks" Mobility   Outcome Measure  Help needed turning from your back to your side while in a flat bed without using bedrails?: None Help needed moving from lying on your back to sitting on the side of a flat bed without using bedrails?: A Little Help needed moving to and from a bed to a chair (including a wheelchair)?: A Lot Help needed standing up from a chair using your arms (e.g., wheelchair or bedside chair)?: A Lot Help needed to walk in hospital room?: A Lot Help needed climbing 3-5 steps with a railing? : Total 6 Click Score: 14    End of Session Equipment  Utilized During Treatment: Gait belt Activity Tolerance: Patient tolerated treatment well Patient left: in bed;with call bell/phone within reach;with bed alarm set;with family/visitor present Nurse Communication: Mobility status PT Visit Diagnosis: Unsteadiness on feet (R26.81);Other abnormalities of gait and mobility (R26.89);Muscle weakness (generalized) (M62.81);Difficulty in walking, not elsewhere classified (R26.2);Pain Pain - Right/Left: Right     Time: 2162-4469 PT Time Calculation (min) (ACUTE ONLY): 26 min  Charges:  $Gait Training: 8-22 mins $Therapeutic Activity: 8-22 mins                     Erasmo Leventhal , PTA Acute Rehabilitation Services Pager [phone] Office [phone]     Benelli Winther Eli Hose 04/19/2020, 11:39 AM

## 2020-04-19 NOTE — Progress Notes (Signed)
[facility] [place]  Subjective  -states he had sweats and chills last night.  Pain in BKA improving.   Objective 129/75 93 98.6 F (37 C) (Oral) 14 100%  Intake/Output Summary (Last 24 hours) at 04/19/2020 1112 Last data filed at 04/18/2020 2252 Gross per 24 hour  Intake 0 ml  Output 2006 ml  Net -2006 ml        Laboratory Lab Results: Recent Labs    04/18/20 1128 04/19/20 0435  WBC 6.0 6.2  HGB 8.0* 8.4*  HCT 26.4* 27.3*  PLT 106* 113*   BMET Recent Labs    04/18/20 1128 04/19/20 0435  NA 136 138  K 4.1 4.0  CL 96* 99  CO2 26 24  GLUCOSE 97 88  BUN 38* 18  CREATININE 8.86* 5.17*  CALCIUM 9.8 9.7    COAG Lab Results  Component Value Date   INR 3.1 (H) 04/19/2020   INR 4.7 (HH) 04/18/2020   INR 3.4 (H) 04/17/2020   No results found for: PTT  Assessment/Planning:  65 year old male status post right BKA.  He does have some bruising along the anterior staple line as pictured above this looks stable and we will continue to monitor.  He is back on his Coumadin with a therapeutic INR for mechanical aortic valve.  Appreciate nephrology following for ESRD.  He has been having fever of unclear etiology.  Blood cultures did not grow anything.  He does have a normal white count.  He does not appear toxic.  There is a question of a cavitary lesion on his chest x-ray we will order CT chest without contrast for further evaluation.  Marty Heck 04/19/2020 11:12 AM --

## 2020-04-20 LAB — RENAL FUNCTION PANEL
Albumin: 2.6 g/dL — ABNORMAL LOW (ref 3.5–5.0)
Albumin: 2.5 g/dL — ABNORMAL LOW (ref 3.5–5.0)
Anion gap: 15 (ref 5–15)
Anion gap: 14 (ref 5–15)
BUN: 32 mg/dL — ABNORMAL HIGH (ref 8–23)
BUN: 38 mg/dL — ABNORMAL HIGH (ref 8–23)
CO2: 27 mmol/L (ref 22–32)
CO2: 26 mmol/L (ref 22–32)
Calcium: 10.3 mg/dL (ref 8.9–10.3)
Calcium: 9.9 mg/dL (ref 8.9–10.3)
Chloride: 96 mmol/L — ABNORMAL LOW (ref 98–111)
Chloride: 94 mmol/L — ABNORMAL LOW (ref 98–111)
Creatinine, Ser: 7.55 mg/dL — ABNORMAL HIGH (ref 0.61–1.24)
Creatinine, Ser: 8.18 mg/dL — ABNORMAL HIGH (ref 0.61–1.24)
GFR, Estimated: 7 mL/min — ABNORMAL LOW (ref >60)
GFR, Estimated: 7 mL/min — ABNORMAL LOW (ref >60)
Glucose, Bld: 97 mg/dL (ref 70–99)
Glucose, Bld: 95 mg/dL (ref 70–99)
Phosphorus: 5 mg/dL — ABNORMAL HIGH (ref 2.5–4.6)
Phosphorus: 5.2 mg/dL — ABNORMAL HIGH (ref 2.5–4.6)
Potassium: 4 mmol/L (ref 3.5–5.1)
Potassium: 3.7 mmol/L (ref 3.5–5.1)
Sodium: 138 mmol/L (ref 135–145)
Sodium: 134 mmol/L — ABNORMAL LOW (ref 135–145)

## 2020-04-20 LAB — CBC
HCT: 26.5 % — ABNORMAL LOW (ref 39.0–52.0)
Hemoglobin: 8.5 g/dL — ABNORMAL LOW (ref 13.0–17.0)
MCH: 35 pg — ABNORMAL HIGH (ref 26.0–34.0)
MCHC: 32.1 g/dL (ref 30.0–36.0)
MCV: 109.1 fL — ABNORMAL HIGH (ref 80.0–100.0)
Platelets: 128 10*3/uL — ABNORMAL LOW (ref 150–400)
RBC: 2.43 MIL/uL — ABNORMAL LOW (ref 4.22–5.81)
RDW: 14.7 % (ref 11.5–15.5)
WBC: 6.2 10*3/uL (ref 4.0–10.5)
nRBC: 0 % (ref 0.0–0.2)

## 2020-04-20 LAB — GLUCOSE, CAPILLARY: Glucose-Capillary: 76 mg/dL (ref 70–99)

## 2020-04-20 LAB — PROTIME-INR
INR: 3.3 — ABNORMAL HIGH (ref 0.8–1.2)
Prothrombin Time: 32.4 seconds — ABNORMAL HIGH (ref 11.4–15.2)

## 2020-04-20 MED ORDER — WARFARIN 0.5 MG HALF TABLET
0.5000 mg | ORAL_TABLET | Freq: Once | ORAL | Status: AC
  Administered 2020-04-20: 0.5 mg via ORAL
  Filled 2020-04-20: qty 1

## 2020-04-20 MED ORDER — SIMETHICONE 80 MG PO CHEW
80.0000 mg | CHEWABLE_TABLET | Freq: Four times a day (QID) | ORAL | Status: DC | PRN
  Administered 2020-04-20 – 2020-04-23 (×4): 80 mg via ORAL
  Filled 2020-04-20 (×4): qty 1

## 2020-04-20 NOTE — Progress Notes (Signed)
ANTICOAGULATION CONSULT NOTE - Follow Up Consult  Pharmacy Consult for  [place] Indication: Afib/AVR  Labs: Recent Labs    04/18/20 0531 04/18/20 1128 04/19/20 0435 04/20/20 0242  HGB  --  8.0* 8.4*  --   HCT  --  26.4* 27.3*  --   PLT  --  106* 113*  --   LABPROT 43.0*  --  31.2* 32.4*  INR 4.7*  --  3.1* 3.3*  CREATININE  --  8.86* 5.17* 7.55*    Assessment: 65 yo male s/p R BKA. He is on warfarin PTA for mech AVR/afib and this was restarted 1/10 (he is no longer on IV heparin). INR was 10.2 on 1/3 and he is noted s/p FFP since then.  -INR 3.1>>3.3  -s/p 1mg  po vitamin K on 1/12 (INR up to 4.7 s/p 0.5mg  warfarin) -Hg stable   Goal of Therapy:  INR goal 2.5 to 3.5   Plan:  -Warfarin 0.5 mg po today -Daily PT/INR  Hildred Laser, PharmD Clinical Pharmacist **Pharmacist phone directory can now be found on [email or web address] ([email or web address]).  Listed under [place].

## 2020-04-20 NOTE — Progress Notes (Signed)
Nephrology Follow-Up Consult Note   Assessment/Recommendations: Jeremy [name] is a/an 65 y.o. male with a past medical history significant for ESRD on HD admitted for RLE amputation.   # ESRD: MWF schedule, outpatient unit: [place] (follows w/ Dr. Orson Ape). 3.75hrs, 2K, 2.25Ca, Na 137, Bicarbonate 35, Dialyzer: F180. Tolerating treatment currently, will maintain MWF # Volume/ hypertension: Continue to challenge EDW as tolerated. Pre and post today # Anemia of Chronic Kidney Disease: Currently receiving Epo 8000 units 3x weekly as an outpatient. Aranesp 49mcg 1/10 # Secondary Hyperparathyroidism/Hyperphosphatemia: Home calcitriol 42mcg MWF # Vascular access: LUE AVF with no issues # RLE ischemia. 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  Pain control and dressing changes per primary. Recommend avoiding morphine and using agents such as hydromorphone or fentanyl given that he is on dialysis. Will stop his PRN morphine, has not required this for days # Mechanical aortic valve: on warfarin outpt, heparin bridge here, mgmt per primary # Fevers: mgmt per primary service. bcx drawn given h/o mechanical aortic valve. Possible cavitary appearing structure on cxr, ct chest does not reveal a cavitary lesion. Possible drug fever? Consider ID eval if no improvement?  # Additional recommendations: - Dose all meds for creatinine clearance <10 ml/min  - Unless absolutely necessary, no MRIs with gadolinium.  - Implement save arm precautions. Prefer needle sticks in the dorsum of the hands or wrists. No blood pressure measurements in arm. - If blood transfusion is requested during hemodialysis sessions, please alert [name] prior to the session.    [facility] 04/20/2020 12:13 PM  ___________________________________________________________  CC: ESRD  Interval History/Subjective:  Seen on HD. No acute events. Temp 100.9 last night. Feels well. Challenging edw. ufg 3500. Pre and post  weights today   Medications:  Current Facility-Administered Medications  Medication Dose Route Frequency Provider Last Rate Last Admin  . acetaminophen (TYLENOL) tablet 500 mg  500 mg Oral Q4H PRN Rosetta Posner, MD   500 mg at 04/20/20 0450  . calcitRIOL (ROCALTROL) capsule 1 mcg  1 mcg Oral Q M,W,F Ulyses Amor, PA-C   1 mcg at 04/18/20 1601  . Chlorhexidine Gluconate Cloth 2 % PADS 6 each  6 each Topical Q0600 Ulyses Amor, PA-C   6 each at 04/20/20 0547  . Darbepoetin Alfa (ARANESP) injection 60 mcg  60 mcg Intravenous Q Mon-HD Laurence Slate M, PA-C   60 mcg at 04/16/20 1034  . ezetimibe (ZETIA) tablet 10 mg  10 mg Oral Daily Laurence Slate M, PA-C   10 mg at 04/19/20 0847  . lamoTRIgine (LAMICTAL) tablet 50 mg  50 mg Oral BID Laurence Slate M, PA-C   50 mg at 04/19/20 2125  . methocarbamol (ROBAXIN) tablet 500 mg  500 mg Oral TID Ulyses Amor, PA-C   500 mg at 04/20/20 0102  . metoprolol tartrate (LOPRESSOR) injection 5 mg  5 mg Intravenous Q6H PRN Marty Heck, MD   5 mg at 04/15/20 1618  . metoprolol tartrate (LOPRESSOR) tablet 75 mg  75 mg Oral BID Ulyses Amor, PA-C   75 mg at 04/19/20 2123  . morphine 4 MG/ML injection 4 mg  4 mg Intravenous Q1H PRN Laurence Slate M, PA-C   4 mg at 04/16/20 1154  . oxyCODONE-acetaminophen (PERCOCET/ROXICET) 5-325 MG per tablet 1-2 tablet  1-2 tablet Oral Q4H PRN Ulyses Amor, PA-C   2 tablet at 04/18/20 1003  . pantoprazole (PROTONIX) EC tablet 40 mg  40 mg Oral  Daily Laurence Slate M, PA-C   40 mg at 04/19/20 0848  . polyethylene glycol (MIRALAX / GLYCOLAX) packet 17 g  17 g Oral Daily Dagoberto Ligas, PA-C   17 g at 04/19/20 0848  . rosuvastatin (CRESTOR) tablet 40 mg  40 mg Oral q1800 Ulyses Amor, PA-C   40 mg at 04/19/20 1615  . sevelamer carbonate (RENVELA) tablet 3,200 mg  3,200 mg Oral TID WC Laurence Slate M, PA-C   3,200 mg at 04/19/20 1615  . simethicone (MYLICON) chewable tablet 80 mg  80 mg Oral QID PRN Ulyses Amor, PA-C   80 mg at 04/20/20 0900  . traZODone (DESYREL) tablet 25 mg  25 mg Oral QHS Laurence Slate M, PA-C   25 mg at 04/19/20 2124  . warfarin (COUMADIN) tablet 0.5 mg  0.5 mg Oral ONCE-1600 Kris Mouton, [facility]      . Warfarin - Pharmacist Dosing Inpatient   Does not apply q1600 Marty Heck, MD          Review of Systems: 10 systems reviewed and negative except per interval history/subjective  Physical Exam: Vitals:   04/20/20 1030 04/20/20 1100  BP: (!) 108/58 118/87  Pulse: 72 93  Resp:    Temp:    SpO2: 100% 100%   No intake/output data recorded. No intake or output data in the 24 hours ending 04/20/20 1213 Constitutional: well-appearing, no acute distress ENMT: ears and nose without scars or lesions, MMM CV: normal rate, no edema in lle Respiratory: bilateral chest rise, normal work of breathing Gastrointestinal: soft, non-tender, no palpable masses or hernias Skin: no visible lesions or rashes, RLE wound covered in dressing Ext: RLE absent below the knee, lue avf +b/t Psych: alert, judgement/insight appropriate, appropriate mood and affect   Test Results I personally reviewed new and old clinical labs and radiology tests Lab Results  Component Value Date   NA 138 04/20/2020   K 4.0 04/20/2020   CL 96 (L) 04/20/2020   CO2 27 04/20/2020   BUN 32 (H) 04/20/2020   CREATININE 7.55 (H) 04/20/2020   GLU 98 04/14/2019   CALCIUM 10.3 04/20/2020   ALBUMIN 2.6 (L) 04/20/2020   PHOS 5.0 (H) 04/20/2020

## 2020-04-20 NOTE — Progress Notes (Signed)
Physical Therapy Treatment Patient Details Name: Jeremy [name] MRN: [id number] DOB: May 10, 1955 Today's Date: 04/20/2020    History of Present Illness Pt is a 65 y.o. 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    PT Comments    Pt is in bed and quite worn out from HD per his report.  Talked with him also about the MD's news about the potential for R AKA. He is concerned and the staff will monitor closely the coloration and condition of R leg to make this decision.  Continue to encourage OOB to chair and to walk, and will focus on strength and ROM to make his progress as optimal as possible.  Follow along for all acute therapy goals, and to increase activity as tolerated to BLE's.  Follow Up Recommendations  CIR     Equipment Recommendations  3in1 (PT);Wheelchair (measurements PT);Wheelchair cushion (measurements PT)    Recommendations for Other Services       Precautions / Restrictions Precautions Precautions: Fall Precaution Comments: R BKA Restrictions Weight Bearing Restrictions: Yes RLE Weight Bearing: Non weight bearing    Mobility  Bed Mobility Overal bed mobility: Needs Assistance Bed Mobility: Rolling (scooting up) Rolling: Supervision         General bed mobility comments: supervision for safety and needs of pt  Transfers Overall transfer level: Needs assistance               General transfer comment: declines OOB after his HD  Ambulation/Gait                 Stairs             Wheelchair Mobility    Modified Rankin (Stroke Patients Only)       Balance                                            Cognition Arousal/Alertness: Awake/alert Behavior During Therapy: WFL for tasks assessed/performed Overall Cognitive Status: Within Functional Limits for  tasks assessed                                        Exercises General Exercises - Lower Extremity Ankle Circles/Pumps: AAROM;Left Quad Sets: AROM;10 reps Gluteal Sets: AROM;10 reps Heel Slides: AAROM;Left Hip ABduction/ADduction: AAROM;20 reps Straight Leg Raises: AAROM;10 reps    General Comments General comments (skin integrity, edema, etc.): Pt was assisted to do ther exercise with telemetry alarms going off.  Noted some a-fib but per wife was controlled by meds a short time before      Pertinent Vitals/Pain Pain Assessment: Faces Faces Pain Scale: Hurts little more Pain Location: R leg Pain Descriptors / Indicators: Discomfort Pain Intervention(s): Limited activity within patient's tolerance;Monitored during session;Premedicated before session;Repositioned    Home Living                      Prior Function            PT Goals (current goals can now be found in the care plan section) Acute Rehab PT Goals Patient Stated Goal: CIR Progress towards PT goals: Progressing toward goals  Frequency    Min 3X/week      PT Plan Current plan remains appropriate    Co-evaluation              AM-PAC PT "6 Clicks" Mobility   Outcome Measure  Help needed turning from your back to your side while in a flat bed without using bedrails?: None Help needed moving from lying on your back to sitting on the side of a flat bed without using bedrails?: A Little Help needed moving to and from a bed to a chair (including a wheelchair)?: A Little Help needed standing up from a chair using your arms (e.g., wheelchair or bedside chair)?: A Lot Help needed to walk in hospital room?: A Lot Help needed climbing 3-5 steps with a railing? : Total 6 Click Score: 15    End of Session   Activity Tolerance: Patient tolerated treatment well Patient left: in bed;with call bell/phone within reach;with bed alarm set;with family/visitor present Nurse  Communication: Mobility status PT Visit Diagnosis: Unsteadiness on feet (R26.81);Other abnormalities of gait and mobility (R26.89);Muscle weakness (generalized) (M62.81);Difficulty in walking, not elsewhere classified (R26.2);Pain Pain - Right/Left: Right Pain - part of body: Leg     Time: 1544-1600 PT Time Calculation (min) (ACUTE ONLY): 16 min  Charges:  $Therapeutic Exercise: 8-22 mins                [name] 04/20/2020, 5:48 PM   [name], PT MS Acute Rehab Dept. Number: [place] [facility] [place] [facility]

## 2020-04-20 NOTE — Progress Notes (Addendum)
[facility] [place]  Subjective  - Feels OK, no new complaints.   Objective 139/71 (!) 112 99.4 F (37.4 C) (Oral) 18 97% No intake or output data in the 24 hours ending 04/20/20 0739     Right BKA incision with darkening skin edges and petechial spots on the anterior stump.  Motor intact NTTP. Lungs non labored breathing  IMPRESSION: 1. No evidence of a cavitary mass in the LEFT lung as questioned on the 04/17/2020 chest x-ray. 2. Mild scarring and dependent atelectasis involving the lower lobes. Mild LEFT LOWER LOBE bronchiectasis. No acute cardiopulmonary disease otherwise. 3. Cholelithiasis. The gallbladder is contracted, presumably related to a recent meal. 4. Renal osteodystrophy.  Aortic Atherosclerosis (ICD10-I70.0).   Assessment/Planning: 04/14/20 right BKA  Dusky changes to right BKA incision Clean dry dressing applied.  We will observe for demarcation.  TM 99.4, WBC 6.2, INR 3.3 therapeutic Mech valve No growth on blood cultures pending final No source for fevers found at this time.      Roxy Horseman 04/20/2020 7:39 AM --  Laboratory Lab Results: Recent Labs    04/18/20 1128 04/19/20 0435  WBC 6.0 6.2  HGB 8.0* 8.4*  HCT 26.4* 27.3*  PLT 106* 113*   BMET Recent Labs    04/19/20 0435 04/20/20 0242  NA 138 138  K 4.0 4.0  CL 99 96*  CO2 24 27  GLUCOSE 88 97  BUN 18 32*  CREATININE 5.17* 7.55*  CALCIUM 9.7 10.3    COAG Lab Results  Component Value Date   INR 3.3 (H) 04/20/2020   INR 3.1 (H) 04/19/2020   INR 4.7 (HH) 04/18/2020   No results found for: PTT  I have seen and evaluated the patient. I agree with the PA note as documented above.  65 year old male status post right BKA.  We have been watching some discoloration along the anterior staple line as seen in the picture we will have to watch this through the weekend.  I discussed with him this morning worst-case scenario would be conversion  to an above-knee amp if this fails to heal.  His pain is much better.  I do not see any signs of infection.  INR is 3.3 on Coumadin for mechanical aortic valve.  I did get a CT of his chest yesterday given question of cavitary lesion and he was having fevers.  No source of infection on CT chest and no cavitary lesion.  No growth on blood cultures.  Overall feels well and does not appear toxic.  Marty Heck, MD [facility] [place] Office: [phone]

## 2020-04-20 NOTE — Progress Notes (Signed)
Inpatient Rehabilitation Admissions Coordinator  I continue to follow pt's progress. Noted planned observation of BKA for demarcation over the weekend.   Danne Baxter, RN, MSN Rehab Admissions Coordinator [phone] 04/20/2020 12:56 PM

## 2020-04-21 LAB — PROTIME-INR
INR: 3.4 — ABNORMAL HIGH (ref 0.8–1.2)
Prothrombin Time: 33.2 seconds — ABNORMAL HIGH (ref 11.4–15.2)

## 2020-04-21 LAB — RENAL FUNCTION PANEL
Albumin: 2.6 g/dL — ABNORMAL LOW (ref 3.5–5.0)
Anion gap: 15 (ref 5–15)
BUN: 22 mg/dL (ref 8–23)
CO2: 27 mmol/L (ref 22–32)
Calcium: 9.9 mg/dL (ref 8.9–10.3)
Chloride: 95 mmol/L — ABNORMAL LOW (ref 98–111)
Creatinine, Ser: 5.26 mg/dL — ABNORMAL HIGH (ref 0.61–1.24)
GFR, Estimated: 12 mL/min — ABNORMAL LOW (ref >60)
Glucose, Bld: 97 mg/dL (ref 70–99)
Phosphorus: 3.6 mg/dL (ref 2.5–4.6)
Potassium: 3.7 mmol/L (ref 3.5–5.1)
Sodium: 137 mmol/L (ref 135–145)

## 2020-04-21 LAB — GLUCOSE, CAPILLARY
Glucose-Capillary: 123 mg/dL — ABNORMAL HIGH (ref 70–99)
Glucose-Capillary: 106 mg/dL — ABNORMAL HIGH (ref 70–99)

## 2020-04-21 MED ORDER — WARFARIN 0.5 MG HALF TABLET: 0.5000 mg | ORAL_TABLET | Freq: Once | ORAL | Status: DC

## 2020-04-21 NOTE — Progress Notes (Signed)
Mobility Specialist - Progress Note   04/21/20 1153  Mobility  Activity Ambulated in room  Level of Assistance Contact guard assist, steadying assist  Assistive Device Front wheel walker  Distance Ambulated (ft) 28 ft  Mobility Response Tolerated well  Mobility performed by Mobility specialist  $Mobility charge 1 Mobility    Pre-mobility: 75 HR, 100% SpO2 During mobility: 120 HR Post-mobility: 80 HR, 99% SpO2  Pt endorsed fatigue in his L LE after ambulating, otherwise asx. He declined sitting up in the recliner, so pt sat on the edge of bed after ambulating to do leg extensions.   Pricilla Handler Mobility Specialist Mobility Specialist Phone: [phone]

## 2020-04-21 NOTE — Progress Notes (Addendum)
  Progress Note    04/21/2020 8:49 AM 7 Days Post-Op  Subjective:  No major complaints   Vitals:   04/21/20 0300 04/21/20 0806  BP: 125/74 125/79  Pulse: 100 (!) 104  Resp: 15 17  Temp: 99 F (37.2 C) 98.1 F (36.7 C)  SpO2: 98% 100%   Physical Exam: Cardiac: regular Lungs: non labored Incisions: right BKA staples intact. Darkening of edges along staple line. Continues to have petechial spots on anterior stump  Extremities:RLE well perfused and warm. 2+ femoral pulse Neurologic: alert and oriented  CBC    Component Value Date/Time   WBC 6.2 04/20/2020 1119   RBC 2.43 (L) 04/20/2020 1119   HGB 8.5 (L) 04/20/2020 1119   HGB 10.0 (L) 08/30/2014 1013   HCT 26.5 (L) 04/20/2020 1119   HCT 32.2 (L) 08/30/2014 1013   PLT 128 (L) 04/20/2020 1119   PLT 125 (L) 08/30/2014 1013   MCV 109.1 (H) 04/20/2020 1119   MCV 88.2 09/16/2014 1041   MCV 89 08/30/2014 1013   MCH 35.0 (H) 04/20/2020 1119   MCHC 32.1 04/20/2020 1119   RDW 14.7 04/20/2020 1119   RDW 14.0 08/30/2014 1013   LYMPHSABS 1.2 10/03/2019 0348   LYMPHSABS 0.8 08/30/2014 1013   MONOABS 0.4 10/03/2019 0348   EOSABS 0.1 10/03/2019 0348   EOSABS 0.1 08/30/2014 1013   BASOSABS 0.0 10/03/2019 0348   BASOSABS 0.0 08/30/2014 1013    BMET    Component Value Date/Time   NA 137 04/21/2020 0143   NA 143 04/14/2019 0000   K 3.7 04/21/2020 0143   CL 95 (L) 04/21/2020 0143   CO2 27 04/21/2020 0143   GLUCOSE 97 04/21/2020 0143   BUN 22 04/21/2020 0143   BUN 20 04/14/2019 0000   CREATININE 5.26 (H) 04/21/2020 0143   CALCIUM 9.9 04/21/2020 0143   CALCIUM 9.2 12/14/2015 1059   GFRNONAA 12 (L) 04/21/2020 0143   GFRAA 8 (L) 12/23/2019 0858    INR    Component Value Date/Time   INR 3.4 (H) 04/21/2020 0143   INR 2.3 03/27/2020 0000   INR 2.3 03/27/2020 0000     Intake/Output Summary (Last 24 hours) at 04/21/2020 0849 Last data filed at 04/20/2020 2200 Gross per 24 hour  Intake 120 ml  Output 2397 ml  Net  -2277 ml     Assessment/Plan:  65 y.o. male is s/p right BKA 7 Days Post-Op. Pain well controlled. Can leave BKA site open to air. Continues to have duskiness of right BKA stump. Continuing to allow to demarcate. Will continue to monitor over weekend. Low grade fever over night. No source of infection has been identified. No growth on cultures. Eventual dispo to CIR   DVT prophylaxis:     Marval Regal [facility] [phone] 04/21/2020 8:49 AM   I have independently interviewed and examined patient and agree with PA assessment and plan above.  Amputation site really looks viable with 1 area of skin sloughing does have some ecchymosis secondary to elevated INR.  We will continue to monitor throughout the weekend with eventual plans for CIR.  Kamaree Berkel C. Donzetta Matters, MD [facility] [place] Office: [phone] Pager: [phone]

## 2020-04-21 NOTE — Progress Notes (Signed)
ANTICOAGULATION CONSULT NOTE - Follow Up Consult  Pharmacy Consult for Warfarin Indication: Afib/AVR  Labs: Recent Labs    04/18/20 1128 04/19/20 0435 04/20/20 0242 04/20/20 1119 04/20/20 1120 04/21/20 0143  HGB 8.0* 8.4*  --  8.5*  --   --   HCT 26.4* 27.3*  --  26.5*  --   --   PLT 106* 113*  --  128*  --   --   LABPROT  --  31.2* 32.4*  --   --  33.2*  INR  --  3.1* 3.3*  --   --  3.4*  CREATININE 8.86* 5.17* 7.55*  --  8.18* 5.26*    Assessment: 65 yo male s/p R BKA. He is on warfarin PTA for mech AVR/afib and this was restarted 1/10 (no longer on IV heparin). INR was 10.2 on 1/3 and he is noted s/p FFP since then.  -INR 3.1>>3.3>>3.4. -s/p 1mg  po vitamin K on 1/12 (INR up to 4.7 s/p 0.5mg  warfarin) -Hg stable  INRs trending up despite very small doses of warfarin. INR today is at the higher end of goal range. CBC low but stable, no bleeding per RN.  Goal of Therapy:  INR goal 2.5 to 3.5   Plan:  -Hold warfarin today -Follow daily PT/INR and CBC -Monitor for s/sx bleeding  Mercy Riding, PharmD PGY1 Acute Care Pharmacy Resident Please refer to [facility] for unit-specific pharmacist

## 2020-04-21 NOTE — Progress Notes (Signed)
Nephrology Follow-Up Consult Note   Assessment/Recommendations: Jeremy [name] is a/an 65 y.o. male with a past medical history significant for ESRD on HD admitted for RLE amputation.   # ESRD: MWF schedule, outpatient unit: [place] (follows w/ Dr. Orson Ape). 3.75hrs, 2K, 2.25Ca, Na 137, Bicarbonate 35, Dialyzer: F180. Tolerating treatment currently, will maintain MWF # Volume/ hypertension: Continue to challenge EDW as tolerated. Pre and post today # Anemia of Chronic Kidney Disease: Currently receiving Epo 8000 units 3x weekly as an outpatient. Aranesp 30mcg 1/10 # Secondary Hyperparathyroidism/Hyperphosphatemia: Home calcitriol 26mcg MWF # Vascular access: LUE AVF with no issues # RLE ischemia. 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  Pain control and dressing changes per primary. Recommend avoiding morphine and using agents such as hydromorphone or fentanyl given that he is on dialysis. Will stop his PRN morphine, has not required this for days # Mechanical aortic valve: on warfarin outpt, heparin bridge here, mgmt per primary # Fevers: mgmt per primary service. bcx drawn given h/o mechanical aortic valve. Possible cavitary appearing structure on cxr, ct chest does not reveal a cavitary lesion. Possible drug fever? Does report chronic night sweats. Recommend ID consultation if continuing w/ fevers  # Additional recommendations: - Dose all meds for creatinine clearance <10 ml/min  - Unless absolutely necessary, no MRIs with gadolinium.  - Implement save arm precautions. Prefer needle sticks in the dorsum of the hands or wrists. No blood pressure measurements in arm. - If blood transfusion is requested during hemodialysis sessions, please alert [name] prior to the session.    [facility] 04/21/2020 1:02 PM  ___________________________________________________________  CC: ESRD  Interval History/Subjective:  Temp curve better. No complaints. No post hd  weight?   Medications:  Current Facility-Administered Medications  Medication Dose Route Frequency Provider Last Rate Last Admin  . acetaminophen (TYLENOL) tablet 500 mg  500 mg Oral Q4H PRN Rosetta Posner, MD   500 mg at 04/20/20 0450  . calcitRIOL (ROCALTROL) capsule 1 mcg  1 mcg Oral Q M,W,F Ulyses Amor, PA-C   1 mcg at 04/20/20 1518  . Chlorhexidine Gluconate Cloth 2 % PADS 6 each  6 each Topical Q0600 Ulyses Amor, PA-C   6 each at 04/21/20 [phone]  . Darbepoetin Alfa (ARANESP) injection 60 mcg  60 mcg Intravenous Q Mon-HD Laurence Slate M, PA-C   60 mcg at 04/16/20 1034  . ezetimibe (ZETIA) tablet 10 mg  10 mg Oral Daily Laurence Slate M, PA-C   10 mg at 04/21/20 7793  . lamoTRIgine (LAMICTAL) tablet 50 mg  50 mg Oral BID Laurence Slate M, PA-C   50 mg at 04/21/20 0831  . methocarbamol (ROBAXIN) tablet 500 mg  500 mg Oral TID Ulyses Amor, PA-C   500 mg at 04/21/20 9030  . metoprolol tartrate (LOPRESSOR) injection 5 mg  5 mg Intravenous Q6H PRN Marty Heck, MD   5 mg at 04/20/20 1523  . metoprolol tartrate (LOPRESSOR) tablet 75 mg  75 mg Oral BID Laurence Slate M, PA-C   75 mg at 04/21/20 0830  . oxyCODONE-acetaminophen (PERCOCET/ROXICET) 5-325 MG per tablet 1-2 tablet  1-2 tablet Oral Q4H PRN Ulyses Amor, PA-C   1 tablet at 04/20/20 1521  . pantoprazole (PROTONIX) EC tablet 40 mg  40 mg Oral Daily Laurence Slate M, PA-C   40 mg at 04/21/20 0923  . polyethylene glycol (MIRALAX / GLYCOLAX) packet 17 g  17 g Oral Daily Dagoberto Ligas, PA-C  17 g at 04/21/20 0833  . rosuvastatin (CRESTOR) tablet 40 mg  40 mg Oral q1800 Ulyses Amor, PA-C   40 mg at 04/20/20 2112  . sevelamer carbonate (RENVELA) tablet 3,200 mg  3,200 mg Oral TID WC Laurence Slate M, PA-C   3,200 mg at 04/21/20 1251  . simethicone (MYLICON) chewable tablet 80 mg  80 mg Oral QID PRN Ulyses Amor, PA-C   80 mg at 04/20/20 1516  . traZODone (DESYREL) tablet 25 mg  25 mg Oral QHS Laurence Slate M, PA-C   25 mg  at 04/20/20 2111  . Warfarin - Pharmacist Dosing Inpatient   Does not apply q1600 Marty Heck, MD          Review of Systems: 10 systems reviewed and negative except per interval history/subjective  Physical Exam: Vitals:   04/21/20 0806 04/21/20 1113  BP: 125/79 (!) 119/59  Pulse: (!) 104 75  Resp: 17 20  Temp: 98.1 F (36.7 C) 98.2 F (36.8 C)  SpO2: 100% 100%   No intake/output data recorded.  Intake/Output Summary (Last 24 hours) at 04/21/2020 1302 Last data filed at 04/20/2020 2200 Gross per 24 hour  Intake 120 ml  Output 2397 ml  Net -2277 ml   Constitutional: well-appearing, no acute distress ENMT: ears and nose without scars or lesions, MMM CV: normal rate, no edema in lle Respiratory: bilateral chest rise, normal work of breathing Gastrointestinal: soft, non-tender, no palpable masses or hernias Ext: RLE absent below the knee, lue avf +b/t Psych: alert, judgement/insight appropriate, appropriate mood and affect   Test Results I personally reviewed new and old clinical labs and radiology tests Lab Results  Component Value Date   NA 137 04/21/2020   K 3.7 04/21/2020   CL 95 (L) 04/21/2020   CO2 27 04/21/2020   BUN 22 04/21/2020   CREATININE 5.26 (H) 04/21/2020   GLU 98 04/14/2019   CALCIUM 9.9 04/21/2020   ALBUMIN 2.6 (L) 04/21/2020   PHOS 3.6 04/21/2020

## 2020-04-22 LAB — RENAL FUNCTION PANEL
Albumin: 2.4 g/dL — ABNORMAL LOW (ref 3.5–5.0)
Anion gap: 15 (ref 5–15)
BUN: 37 mg/dL — ABNORMAL HIGH (ref 8–23)
CO2: 27 mmol/L (ref 22–32)
Calcium: 10.3 mg/dL (ref 8.9–10.3)
Chloride: 97 mmol/L — ABNORMAL LOW (ref 98–111)
Creatinine, Ser: 7.88 mg/dL — ABNORMAL HIGH (ref 0.61–1.24)
GFR, Estimated: 7 mL/min — ABNORMAL LOW (ref >60)
Glucose, Bld: 138 mg/dL — ABNORMAL HIGH (ref 70–99)
Phosphorus: 4.5 mg/dL (ref 2.5–4.6)
Potassium: 3.8 mmol/L (ref 3.5–5.1)
Sodium: 139 mmol/L (ref 135–145)

## 2020-04-22 LAB — CBC
HCT: 28.1 % — ABNORMAL LOW (ref 39.0–52.0)
Hemoglobin: 8.9 g/dL — ABNORMAL LOW (ref 13.0–17.0)
MCH: 34.5 pg — ABNORMAL HIGH (ref 26.0–34.0)
MCHC: 31.7 g/dL (ref 30.0–36.0)
MCV: 108.9 fL — ABNORMAL HIGH (ref 80.0–100.0)
Platelets: 147 10*3/uL — ABNORMAL LOW (ref 150–400)
RBC: 2.58 MIL/uL — ABNORMAL LOW (ref 4.22–5.81)
RDW: 14.5 % (ref 11.5–15.5)
WBC: 5.9 10*3/uL (ref 4.0–10.5)
nRBC: 0 % (ref 0.0–0.2)

## 2020-04-22 LAB — CULTURE, BLOOD (ROUTINE X 2)
Culture: NO GROWTH
Culture: NO GROWTH
Special Requests: ADEQUATE

## 2020-04-22 LAB — PROTIME-INR
INR: 3.9 — ABNORMAL HIGH (ref 0.8–1.2)
Prothrombin Time: 37.1 seconds — ABNORMAL HIGH (ref 11.4–15.2)

## 2020-04-22 NOTE — Progress Notes (Addendum)
Mobility Specialist - Progress Note    04/22/20 1303  Mobility  Activity Ambulated in room  Level of Assistance Contact guard assist, steadying assist  Assistive Device Front wheel walker  Distance Ambulated (ft) 40 ft  Mobility Response Tolerated well  Mobility performed by Mobility specialist  $Mobility charge 1 Mobility   Pre-mobility: 82 HR During mobility: 118 HR Post-mobility: 90 HR  Distance limited by fatigue. No assistance to stand from bed, contact guard w/ gait belt for safety. Pt sitting up in chair after walk.   Pricilla Handler Mobility Specialist Mobility Specialist Phone: [phone]

## 2020-04-22 NOTE — Discharge Instructions (Signed)
Leg Amputation, Care After °This sheet gives you information about how to care for yourself after your procedure. Your health care provider may also give you more specific instructions. If you have problems or questions, contact your health care provider. °What can I expect after the procedure? °After the procedure, it is common to have: °· A little blood or fluid coming from your incision. °· Pain from your incision. °· Pain that feels like it is coming from the leg that has been removed (phantom pain). This can last for a year or longer. °· Skin breakdown on your stump (residual limb). °· Feelings of depression, anxiety, and fear. °Follow these instructions at home: °Medicines °· Take over-the-counter and prescription medicines only as told by your health care provider. °· If you were prescribed an antibiotic medicine, take it as told by your health care provider. Do not stop taking the antibiotic even if you start to feel better. °Bathing °· Do not take baths, swim, use a hot tub, or get your residual limb wet until your health care provider approves. You may only be allowed to take sponge baths. °· Ask your health care provider when you may start taking showers. After taking a shower, make sure to rinse and dry your residual limb carefully. °Incision care °· Check your residual limb, especially your incision area, every day. Check for: °? More redness, swelling, or pain. °? More fluid or blood. °? Warmth. °? Pus or a bad smell. °? Blisters. °? Scrapes. °· Follow instructions from your health care provider about how to take care of your incision. Make sure you: °? Wash your hands with soap and water before you change your bandage (dressing). If soap and water are not available, use hand sanitizer. °? Change your dressing as told by your health care provider. °? Leave stitches (sutures), skin glue, or adhesive strips in place. These skin closures may need to stay in place for 2 weeks or longer. If adhesive strip  edges start to loosen and curl up, you may trim the loose edges. Do not remove adhesive strips completely unless your health care provider tells you to do that.   °Activity °· Return to your normal activities as told by your health care provider. Ask your health care provider what activities are safe for you. °· Do physical therapy exercises as told by your health care provider. °· If you have been fitted with an artificial leg (prosthesis) or have been given crutches, use them as told by your health care provider. °Eating and drinking °· Eat a healthy diet that includes whole grains, fruits and vegetables, low-fat dairy products, and lean proteins. °· Drink enough fluid to keep your urine pale yellow. °Driving °· Work with an occupational therapist to learn new strategies for safe driving with an amputation. °· Do not drive or use heavy equipment while taking prescription pain medicine. °General instructions °· To prevent or treat constipation while you are taking prescription pain medicine, your health care provider may recommend that you: °? Drink enough fluid to keep your urine pale yellow. °? Take over-the-counter or prescription medicines. °? Eat foods that are high in fiber, such as fresh fruits and vegetables, whole grains, and beans. °? Limit foods that are high in fat and processed sugars, such as fried and sweet foods. °· Do not use oils, lotion, cream, or rubbing alcohol on the remaining part of your leg. °· Wear compression stockings as told by your health care provider. °· If you have trouble   coping with your amputation, contact your health care provider. Some feelings of depression, anxiety, or fear are normal after an amputation, but if you struggle with these feelings or if they get overwhelming, your provider may be able to recommend a therapist or support group to help you. °· Do not use any products that contain nicotine or tobacco, such as cigarettes and e-cigarettes. These can delay bone  healing. If you need help quitting, ask your health care provider. °· Keep all follow-up visits as told by your health care provider. This is important. °Contact a health care provider if: °· You have a fever. °· You have more tenderness in your residual limb. °· You have a rash or itchy skin. °· You have a cough or chills and you feel achy and weak. °· You have trouble coping with your amputation. °· You have blisters or scrapes on your residual limb. °Get help right away if: °· You have severe pain in your residual limb. °· You have more redness, swelling, or pain around your incision. °· You have more fluid or blood coming from your incision. °· Your incision feels warm to the touch, tender, and painful. °· You have pus or a bad smell coming from your incision. °· You feel light-headed and have shortness of breath. °· You have blood-soaked bandages. °· You cough up blood. °· You have chest pain or pain when taking a deep breath or coughing. If you have these symptoms, do not drive yourself to the hospital. Call emergency services right away. °If you ever feel like you may hurt yourself or others, or have thoughts about taking your own life, get help right away. You can go to your nearest emergency department or call: °· Your local emergency services (911 in the [place] °· A suicide crisis helpline, such as the National Suicide Prevention Lifeline at 1-800-273-8255. This is open 24 hours a day. °Summary °· After a leg amputation, you may have pain that feels like it is coming from the leg that was removed (phantom pain). This can last for a year or longer. °· Follow instructions from your health care provider about how to take care of your incision. °· Check your residual limb, especially your incision area, every day. More redness, swelling, or pain may be a sign of infection. °· Contact your health care provider if you have trouble coping with your amputation. °This information is not intended to replace advice  given to you by your health care provider. Make sure you discuss any questions you have with your health care provider. °Document Revised: 07/07/2019 Document Reviewed: 07/07/2019 °Elsevier Patient Education © 2021 [employer] ° °

## 2020-04-22 NOTE — Progress Notes (Signed)
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 He is on warfarin 1mg  daily PTA for mech AVR/afib and this was restarted 1/10 (no longer on IV heparin). INR was 10.2 on 1/3 and he is noted s/p FFP since then.  -s/p 1mg  po vitamin K on 1/12 (INR up to 4.7 s/p 0.5mg  warfarin)  INR up to 3.9 despite very minimal doses of warfarin. No DDI's noted. CBC remains stable.   Goal of Therapy:  INR goal 2.5 to 3.5   Plan:  -Hold warfarin again today -Follow daily PT/INR and CBC -Monitor for s/sx bleeding  Mercy Riding, PharmD PGY1 Acute Care Pharmacy Resident Please refer to [facility] for unit-specific pharmacist

## 2020-04-22 NOTE — Discharge Summary (Signed)
Discharge Summary  Patient ID: Jeremy [name] [id number] 64 y.o. [birth year]  Admit date: 04/12/2020  Discharge date and time: 04/23/20  Admitting Physician: Marty Heck, MD   Discharge Physician: Dr. Monica Martinez  Admission Diagnoses: Critical lower limb ischemia [facility]) [I70.229]  Discharge Diagnoses: Critical lower limb ischemia  Admission Condition: fair  Discharged Condition: good  Indication for Admission: critical right lower extremity ischemia  Hospital Course: 65 year old male admitted with critical lower extremity ischemia with tissue loss and underwent right below knee amputation by Dr. Carlis Abbott on 04/14/20. He tolerated the procedure well and was transferred to the PACU in stable condition  POD#1 post op pain. Right BKA dressings remained clean and dry. On heparin for mechanical valve. Muscle relaxer's given to help with BKA site pain. Coumadin restarted. Nephrology consulted for inpatient dialysis  POD 2 improved operative site pain. Hemoglobin dropped overnight. Heparin discontinued. Right BKA dressings removed and redressed. Encourage stump elevation. Continued pain control. Had Inpatient hemodialysis. Tolerated working some with mobility specialist. PT evaluated and recommendation was made for inpatient rehab. CIR was consulted for admission   POD 3 patient continuing to have pain in right BKA.  Dressings changed and appeared viable with some ecchymosis of skin flaps. Developed some fevers over night. WBC normal.  Blood cultures sent and chest x ray ordered. Otherwise hemodynamically stable. OT evaluated patient and also recommended inpatient rehab. Tolerated mobility session and PT.  POD 4 right BKA stable. Po vitamin K given for supra therapeutic INR of 4.7. Continued low grade fever. Normal White blood cell count.Tolerated HD session. Chest x-ray showing cavitary appearing structure. CT chest ordered to further evaluate  POD 5 some night sweats. Pain in right  BKA improving. Continued ecchymosis of the BKA flaps. Continued on Coumadin with now therapeutic INR. No growth on blood cultures. WBC remains normal.   POD 6 1/14  Continued duskiness and ecchymosis of right BKA site. Pain improved. Continued fever of unknown etiology. Cultures still with no growth. Tolerated Hemodialysis. CT showed no source of infection and no cavitary lesion. Decided to continue to observe his BKA stump over the weekend to allow it to demarcate. Discussed with patient possible conversion to a right above knee amputation if it showed signs of continued inadequate healing  POD 7 & 8 Patient remained stable. BKA site unchanged with ecchymosis. Pain overall better controlled. Tolerated PT and mobility specialist sessions. Afebrile. INR remained therapeutic. Hemodynamically stable  POD# 9 Patient remained stable. Pain improved. Tolerating ambulation with assistance. Right BKA stump with continued ecchymosis but appears viable. Inpatient Hemodialysis session today prior to going to rehab. INR supra therapeutic again today. Coumadin held. Stable for discharge to Inpatient Rehab. Will continue to follow patient while he is in rehab. He has a scheduled outpatient follow up in 4 weeks for staple removal   Consults: nephrology  Treatments: analgesia: acetaminophen, Morphine and percocet, dialysis: Hemodialysis and surgery: Right below knee amputation  Disposition: Discharge disposition: 90-DC/txfr to inpt rehab facility with planned acute care hosp IP admission     CIR  Patient Instructions:  Allergies as of 04/23/2020      Reactions   Statins Other (See Comments)   Arthralgias (intolerance), Myalgias (intolerance) Pt taking pravastatin    Gabapentin    Drowsy    Sertraline Rash      Medication List    STOP taking these medications   azelastine 0.1 % nasal spray Commonly known as: ASTELIN   benzonatate 200 MG capsule Commonly  known as: TESSALON     TAKE these  medications   acetaminophen 500 MG tablet Commonly known as: TYLENOL Take 1,000 mg 2 (two) times daily as needed by mouth for moderate pain.   allopurinol 300 MG tablet Commonly known as: ZYLOPRIM Take 300 mg by mouth daily.   cinacalcet 30 MG tablet Commonly known as: SENSIPAR Take 30 mg by mouth daily.   clopidogrel 75 MG tablet Commonly known as: PLAVIX Take 1 tablet (75 mg total) by mouth daily with breakfast.   ezetimibe 10 MG tablet Commonly known as: ZETIA TAKE 1 TABLET BY MOUTH EVERY DAY   hydroxychloroquine 200 MG tablet Commonly known as: PLAQUENIL Take 400 mg by mouth at bedtime.   iron sucrose in sodium chloride 0.9 % 100 mL Iron Sucrose (Venofer)   lamoTRIgine 25 MG tablet Commonly known as: LAMICTAL Take 2 tablets (50 mg total) by mouth 2 (two) times daily.   lidocaine-prilocaine cream Commonly known as: EMLA Apply 1 application topically daily as needed (for fistula).   loperamide 2 MG tablet Commonly known as: IMODIUM A-D Take 2 mg by mouth 4 (four) times daily as needed for diarrhea or loose stools.   Metoprolol Tartrate 75 MG Tabs Take 75 mg by mouth 2 (two) times daily.   midodrine 10 MG tablet Commonly known as: PROAMATINE Take 10 mg by mouth every Monday, Wednesday, and Friday.   multivitamin Tabs tablet Take 1 tablet by mouth daily.   nitroGLYCERIN 0.4 MG SL tablet Commonly known as: NITROSTAT Place 1 tablet (0.4 mg total) under the tongue every 5 (five) minutes as needed for chest pain.   oxyCODONE 5 MG immediate release tablet Commonly known as: Oxy IR/ROXICODONE Take 1-2 tablets (5-10 mg total) by mouth every 4 (four) hours as needed for severe pain.   pantoprazole 40 MG tablet Commonly known as: PROTONIX Take 1 tablet (40 mg total) by mouth daily.   predniSONE 5 MG tablet Commonly known as: DELTASONE Take 5 mg by mouth daily.   RETACRIT IJ Epoetin alfa - epbx (Retacrit)   rosuvastatin 40 MG tablet Commonly known as:  CRESTOR Take 1 tablet (40 mg total) by mouth daily at 6 PM.   sevelamer carbonate 800 MG tablet Commonly known as: RENVELA Take 3,200 mg by mouth 3 (three) times daily with meals.   traZODone 50 MG tablet Commonly known as: DESYREL TAKE 1/2 -1 TABLET (25-50 MG TOTAL) BY MOUTH AT BEDTIME AS NEEDED FOR SLEEP. What changed: See the new instructions.   warfarin 1 MG tablet Commonly known as: COUMADIN Take as directed. If you are unsure how to take this medication, talk to your nurse or doctor. Original instructions: TAKE 1 MG DAILY ON EVERY DAY EXCEPT TAKE 0.5 MG ON FRIDAYS. *OR AS DIRECTED* What changed: See the new instructions.      Activity: activity as tolerated Diet: Renal diet Wound Care: keep wound clean and dry and can leave open to air. May dress at night per patients request. Continue to observe for signs of infection/ necrosis of BKA stump  Follow-up with Dr. Carlis Abbott in 4 weeks.  SignedKaroline Caldwell, PA-C 04/23/2020 11:15 AM [facility] Office: [phone]

## 2020-04-22 NOTE — Progress Notes (Addendum)
  Progress Note    04/22/2020 8:00 AM 8 Days Post-Op  Subjective: No major complaints   Vitals:   04/21/20 1837 04/22/20 0540  BP: (!) 145/66 115/68  Pulse:    Resp: 15   Temp: 98.4 F (36.9 C) 98 F (36.7 C)  SpO2: 95%    Physical Exam: Cardiac: regular Lungs: non labored Incisions: right BKA stable continues to have ecchymosis and some duskiness which is unchanged. No drainage Extremities:  Well perfused and warm Neurologic: alert and oriented  CBC    Component Value Date/Time   WBC 5.9 04/22/2020 0128   RBC 2.58 (L) 04/22/2020 0128   HGB 8.9 (L) 04/22/2020 0128   HGB 10.0 (L) 08/30/2014 1013   HCT 28.1 (L) 04/22/2020 0128   HCT 32.2 (L) 08/30/2014 1013   PLT 147 (L) 04/22/2020 0128   PLT 125 (L) 08/30/2014 1013   MCV 108.9 (H) 04/22/2020 0128   MCV 88.2 09/16/2014 1041   MCV 89 08/30/2014 1013   MCH 34.5 (H) 04/22/2020 0128   MCHC 31.7 04/22/2020 0128   RDW 14.5 04/22/2020 0128   RDW 14.0 08/30/2014 1013   LYMPHSABS 1.2 10/03/2019 0348   LYMPHSABS 0.8 08/30/2014 1013   MONOABS 0.4 10/03/2019 0348   EOSABS 0.1 10/03/2019 0348   EOSABS 0.1 08/30/2014 1013   BASOSABS 0.0 10/03/2019 0348   BASOSABS 0.0 08/30/2014 1013    BMET    Component Value Date/Time   NA 139 04/22/2020 0128   NA 143 04/14/2019 0000   K 3.8 04/22/2020 0128   CL 97 (L) 04/22/2020 0128   CO2 27 04/22/2020 0128   GLUCOSE 138 (H) 04/22/2020 0128   BUN 37 (H) 04/22/2020 0128   BUN 20 04/14/2019 0000   CREATININE 7.88 (H) 04/22/2020 0128   CALCIUM 10.3 04/22/2020 0128   CALCIUM 9.2 12/14/2015 1059   GFRNONAA 7 (L) 04/22/2020 0128   GFRAA 8 (L) 12/23/2019 0858    INR    Component Value Date/Time   INR 3.9 (H) 04/22/2020 0128   INR 2.3 03/27/2020 0000   INR 2.3 03/27/2020 0000     Intake/Output Summary (Last 24 hours) at 04/22/2020 0800 Last data filed at 04/21/2020 1500 Gross per 24 hour  Intake 720 ml  Output --  Net 720 ml     Assessment/Plan:  65 y.o. male is s/p  right BKA 8 Days Post-Op. Pain well controlled. Continue po pain medication. Morphine stopped per Nephrology. Working well with mobility specialist. Continue to mobilize as tolerated. Will continue to monitor stump. Afebrile. VSS. HD per Nephrology. Eventual plans for CIR   DVT prophylaxis: Warfarin   [name], PA-C [facility] [phone] 04/22/2020 8:00 AM   I have independently interviewed and examined patient and agree with PA assessment and plan above. bka looks viable. CIR when bed available.   Kaedence Connelly C. Donzetta Matters, MD [facility] [place] Office: [phone] Pager: [phone]

## 2020-04-22 NOTE — Progress Notes (Signed)
Nephrology Follow-Up Consult Note   Assessment/Recommendations: Jeremy [name] is a/an 65 y.o. male with a past medical history significant for ESRD on HD admitted for RLE amputation.   # ESRD: MWF schedule, outpatient unit: [facility]. 3.75hrs, 2K, 2.25Ca, Na 137, Bicarbonate 35, Dialyzer: F180. Tolerating treatments, will maintain MWF here # Volume/ hypertension: Continue to challenge EDW as tolerated. Pre and post weight needed tomorrow # Anemia of Chronic Kidney Disease: Currently receiving Epo 8000 units 3x weekly as an outpatient. Aranesp 53mcg 1/10, qmonday # Secondary Hyperparathyroidism/Hyperphosphatemia: Home calcitriol 78mcg MWF # Vascular access: LUE AVF with no issues # RLE ischemia. 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  Pain control and dressing changes per primary. Recommend avoiding morphine and using agents such as hydromorphone or fentanyl given that he is on dialysis. 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 bcx drawn given h/o mechanical aortic valve-neg. Possible cavitary appearing structure on cxr, ct chest does not reveal a cavitary lesion. Possible drug fever? Does report chronic night sweats. Recommend ID consultation if continuing w/ fevers  # Additional recommendations: - Dose all meds for creatinine clearance <10 ml/min  - Unless absolutely necessary, no MRIs with gadolinium.  - Implement save arm precautions. Prefer needle sticks in the dorsum of the hands or wrists. No blood pressure measurements in arm. - If blood transfusion is requested during hemodialysis sessions, please alert [name] prior to the session.    [facility] 04/22/2020 11:29 AM  ___________________________________________________________  CC: ESRD  Interval History/Subjective:  Afebrile, no acute events. No complaints. Awaiting cir  bed.   Medications:  Current Facility-Administered Medications  Medication Dose Route Frequency Provider Last Rate Last Admin  . acetaminophen (TYLENOL) tablet 500 mg  500 mg Oral Q4H PRN Rosetta Posner, MD   500 mg at 04/20/20 0450  . calcitRIOL (ROCALTROL) capsule 1 mcg  1 mcg Oral Q M,W,F Ulyses Amor, PA-C   1 mcg at 04/20/20 1518  . Chlorhexidine Gluconate Cloth 2 % PADS 6 each  6 each Topical Q0600 Ulyses Amor, PA-C   6 each at 04/21/20 [phone]  . Darbepoetin Alfa (ARANESP) injection 60 mcg  60 mcg Intravenous Q Mon-HD Laurence Slate M, PA-C   60 mcg at 04/16/20 1034  . ezetimibe (ZETIA) tablet 10 mg  10 mg Oral Daily Laurence Slate M, PA-C   10 mg at 04/22/20 0801  . lamoTRIgine (LAMICTAL) tablet 50 mg  50 mg Oral BID Laurence Slate M, PA-C   50 mg at 04/22/20 0801  . methocarbamol (ROBAXIN) tablet 500 mg  500 mg Oral TID Ulyses Amor, PA-C   500 mg at 04/22/20 0801  . metoprolol tartrate (LOPRESSOR) injection 5 mg  5 mg Intravenous Q6H PRN Marty Heck, MD   5 mg at 04/20/20 1523  . metoprolol tartrate (LOPRESSOR) tablet 75 mg  75 mg Oral BID Laurence Slate M, PA-C   75 mg at 04/22/20 0801  . oxyCODONE-acetaminophen (PERCOCET/ROXICET) 5-325 MG per tablet 1-2 tablet  1-2 tablet Oral Q4H PRN Ulyses Amor, PA-C   2 tablet at 04/21/20 2058  . pantoprazole (PROTONIX) EC tablet 40 mg  40 mg Oral Daily Laurence Slate M, PA-C   40 mg at 04/22/20 0801  . polyethylene glycol (MIRALAX / GLYCOLAX) packet 17 g  17 g Oral Daily Dagoberto Ligas, PA-C   17  g at 04/22/20 0800  . rosuvastatin (CRESTOR) tablet 40 mg  40 mg Oral q1800 Ulyses Amor, PA-C   40 mg at 04/21/20 1743  . sevelamer carbonate (RENVELA) tablet 3,200 mg  3,200 mg Oral TID WC Laurence Slate M, PA-C   3,200 mg at 04/22/20 0800  . simethicone (MYLICON) chewable tablet 80 mg  80 mg Oral QID PRN Ulyses Amor, PA-C   80 mg at 04/21/20 1743  . traZODone (DESYREL) tablet 25 mg  25 mg Oral QHS Laurence Slate M, PA-C   25 mg at  04/21/20 2105  . Warfarin - Pharmacist Dosing Inpatient   Does not apply q1600 Marty Heck, MD          Review of Systems: 10 systems reviewed and negative except per interval history/subjective  Physical Exam: Vitals:   04/22/20 0540 04/22/20 0802  BP: 115/68 129/64  Pulse:  77  Resp:    Temp: 98 F (36.7 C) 98 F (36.7 C)  SpO2:  92%   No intake/output data recorded.  Intake/Output Summary (Last 24 hours) at 04/22/2020 1129 Last data filed at 04/21/2020 1500 Gross per 24 hour  Intake 240 ml  Output --  Net 240 ml   Constitutional: well-appearing, no acute distress, laying flat in ebd CV: normal rate Respiratory: bilateral chest rise, normal work of breathing Gastrointestinal: soft, non-tender, nondistended Ext: RLE absent below the knee, lue avf +b/t Neuro/psych: alert, judgement/insight appropriate, appropriate mood and affect, speech clear and coherent   Test Results I personally reviewed new and old clinical labs and radiology tests Lab Results  Component Value Date   NA 139 04/22/2020   K 3.8 04/22/2020   CL 97 (L) 04/22/2020   CO2 27 04/22/2020   BUN 37 (H) 04/22/2020   CREATININE 7.88 (H) 04/22/2020   GLU 98 04/14/2019   CALCIUM 10.3 04/22/2020   ALBUMIN 2.4 (L) 04/22/2020   PHOS 4.5 04/22/2020

## 2020-04-23 ENCOUNTER — Encounter (HOSPITAL_COMMUNITY): Payer: Self-pay | Admitting: Physical Medicine & Rehabilitation

## 2020-04-23 ENCOUNTER — Inpatient Hospital Stay (HOSPITAL_COMMUNITY)
Admission: RE | Admit: 2020-04-23 | Discharge: 2020-05-03 | DRG: 559 | Disposition: A | Discharge status: Home Health | Payer: Medicare Other | Source: Intra-hospital | Attending: Physical Medicine & Rehabilitation | Admitting: Physical Medicine & Rehabilitation

## 2020-04-23 DIAGNOSIS — D638 Anemia in other chronic diseases classified elsewhere: Secondary | ICD-10-CM

## 2020-04-23 DIAGNOSIS — M069 Rheumatoid arthritis, unspecified: Secondary | ICD-10-CM | POA: Diagnosis present

## 2020-04-23 DIAGNOSIS — Z888 Allergy status to other drugs, medicaments and biological substances status: Secondary | ICD-10-CM

## 2020-04-23 DIAGNOSIS — G8918 Other acute postprocedural pain: Secondary | ICD-10-CM | POA: Diagnosis not present

## 2020-04-23 DIAGNOSIS — Z79899 Other long term (current) drug therapy: Secondary | ICD-10-CM

## 2020-04-23 DIAGNOSIS — Z7901 Long term (current) use of anticoagulants: Secondary | ICD-10-CM | POA: Diagnosis not present

## 2020-04-23 DIAGNOSIS — E8770 Fluid overload, unspecified: Secondary | ICD-10-CM | POA: Diagnosis not present

## 2020-04-23 DIAGNOSIS — Y830 Surgical operation with transplant of whole organ as the cause of abnormal reaction of the patient, or of later complication, without mention of misadventure at the time of the procedure: Secondary | ICD-10-CM | POA: Diagnosis present

## 2020-04-23 DIAGNOSIS — I5022 Chronic systolic (congestive) heart failure: Secondary | ICD-10-CM | POA: Diagnosis present

## 2020-04-23 DIAGNOSIS — D631 Anemia in chronic kidney disease: Secondary | ICD-10-CM | POA: Diagnosis present

## 2020-04-23 DIAGNOSIS — G4733 Obstructive sleep apnea (adult) (pediatric): Secondary | ICD-10-CM | POA: Diagnosis present

## 2020-04-23 DIAGNOSIS — N2581 Secondary hyperparathyroidism of renal origin: Secondary | ICD-10-CM | POA: Diagnosis present

## 2020-04-23 DIAGNOSIS — Z8616 Personal history of COVID-19: Secondary | ICD-10-CM

## 2020-04-23 DIAGNOSIS — Z85528 Personal history of other malignant neoplasm of kidney: Secondary | ICD-10-CM

## 2020-04-23 DIAGNOSIS — Z4781 Encounter for orthopedic aftercare following surgical amputation: Secondary | ICD-10-CM | POA: Diagnosis not present

## 2020-04-23 DIAGNOSIS — I2510 Atherosclerotic heart disease of native coronary artery without angina pectoris: Secondary | ICD-10-CM | POA: Diagnosis present

## 2020-04-23 DIAGNOSIS — Z952 Presence of prosthetic heart valve: Secondary | ICD-10-CM | POA: Diagnosis not present

## 2020-04-23 DIAGNOSIS — R791 Abnormal coagulation profile: Secondary | ICD-10-CM | POA: Diagnosis not present

## 2020-04-23 DIAGNOSIS — K219 Gastro-esophageal reflux disease without esophagitis: Secondary | ICD-10-CM | POA: Diagnosis present

## 2020-04-23 DIAGNOSIS — I120 Hypertensive chronic kidney disease with stage 5 chronic kidney disease or end stage renal disease: Secondary | ICD-10-CM | POA: Diagnosis not present

## 2020-04-23 DIAGNOSIS — R509 Fever, unspecified: Secondary | ICD-10-CM

## 2020-04-23 DIAGNOSIS — G546 Phantom limb syndrome with pain: Secondary | ICD-10-CM | POA: Diagnosis present

## 2020-04-23 DIAGNOSIS — T8612 Kidney transplant failure: Secondary | ICD-10-CM | POA: Diagnosis present

## 2020-04-23 DIAGNOSIS — I4891 Unspecified atrial fibrillation: Secondary | ICD-10-CM | POA: Diagnosis not present

## 2020-04-23 DIAGNOSIS — Z992 Dependence on renal dialysis: Secondary | ICD-10-CM | POA: Diagnosis not present

## 2020-04-23 DIAGNOSIS — I739 Peripheral vascular disease, unspecified: Secondary | ICD-10-CM | POA: Diagnosis present

## 2020-04-23 DIAGNOSIS — Z20822 Contact with and (suspected) exposure to covid-19: Secondary | ICD-10-CM | POA: Diagnosis present

## 2020-04-23 DIAGNOSIS — Z8249 Family history of ischemic heart disease and other diseases of the circulatory system: Secondary | ICD-10-CM

## 2020-04-23 DIAGNOSIS — I4821 Permanent atrial fibrillation: Secondary | ICD-10-CM | POA: Diagnosis present

## 2020-04-23 DIAGNOSIS — S88112A Complete traumatic amputation at level between knee and ankle, left lower leg, initial encounter: Secondary | ICD-10-CM

## 2020-04-23 DIAGNOSIS — Z905 Acquired absence of kidney: Secondary | ICD-10-CM

## 2020-04-23 DIAGNOSIS — Z7952 Long term (current) use of systemic steroids: Secondary | ICD-10-CM

## 2020-04-23 DIAGNOSIS — I255 Ischemic cardiomyopathy: Secondary | ICD-10-CM | POA: Diagnosis present

## 2020-04-23 DIAGNOSIS — I132 Hypertensive heart and chronic kidney disease with heart failure and with stage 5 chronic kidney disease, or end stage renal disease: Secondary | ICD-10-CM | POA: Diagnosis present

## 2020-04-23 DIAGNOSIS — Z951 Presence of aortocoronary bypass graft: Secondary | ICD-10-CM | POA: Diagnosis not present

## 2020-04-23 DIAGNOSIS — Z89511 Acquired absence of right leg below knee: Secondary | ICD-10-CM

## 2020-04-23 DIAGNOSIS — Z833 Family history of diabetes mellitus: Secondary | ICD-10-CM

## 2020-04-23 DIAGNOSIS — N186 End stage renal disease: Secondary | ICD-10-CM | POA: Diagnosis present

## 2020-04-23 DIAGNOSIS — D7589 Other specified diseases of blood and blood-forming organs: Secondary | ICD-10-CM | POA: Diagnosis not present

## 2020-04-23 DIAGNOSIS — E8889 Other specified metabolic disorders: Secondary | ICD-10-CM | POA: Diagnosis present

## 2020-04-23 DIAGNOSIS — I70229 Atherosclerosis of native arteries of extremities with rest pain, unspecified extremity: Secondary | ICD-10-CM | POA: Diagnosis not present

## 2020-04-23 DIAGNOSIS — Z7902 Long term (current) use of antithrombotics/antiplatelets: Secondary | ICD-10-CM

## 2020-04-23 LAB — RENAL FUNCTION PANEL
Albumin: 2.5 g/dL — ABNORMAL LOW (ref 3.5–5.0)
Anion gap: 16 — ABNORMAL HIGH (ref 5–15)
BUN: 45 mg/dL — ABNORMAL HIGH (ref 8–23)
CO2: 27 mmol/L (ref 22–32)
Calcium: 10.3 mg/dL (ref 8.9–10.3)
Chloride: 95 mmol/L — ABNORMAL LOW (ref 98–111)
Creatinine, Ser: 9.93 mg/dL — ABNORMAL HIGH (ref 0.61–1.24)
GFR, Estimated: 5 mL/min — ABNORMAL LOW (ref >60)
Glucose, Bld: 101 mg/dL — ABNORMAL HIGH (ref 70–99)
Phosphorus: 5.2 mg/dL — ABNORMAL HIGH (ref 2.5–4.6)
Potassium: 4.2 mmol/L (ref 3.5–5.1)
Sodium: 138 mmol/L (ref 135–145)

## 2020-04-23 LAB — CBC
HCT: 30 % — ABNORMAL LOW (ref 39.0–52.0)
Hemoglobin: 9.1 g/dL — ABNORMAL LOW (ref 13.0–17.0)
MCH: 33.5 pg (ref 26.0–34.0)
MCHC: 30.3 g/dL (ref 30.0–36.0)
MCV: 110.3 fL — ABNORMAL HIGH (ref 80.0–100.0)
Platelets: 150 10*3/uL (ref 150–400)
RBC: 2.72 MIL/uL — ABNORMAL LOW (ref 4.22–5.81)
RDW: 14.6 % (ref 11.5–15.5)
WBC: 5.7 10*3/uL (ref 4.0–10.5)
nRBC: 0 % (ref 0.0–0.2)

## 2020-04-23 LAB — PROTIME-INR
INR: 5.4 (ref 0.8–1.2)
Prothrombin Time: 47.4 seconds — ABNORMAL HIGH (ref 11.4–15.2)

## 2020-04-23 MED ORDER — ALUMINUM HYDROXIDE GEL 320 MG/5ML PO SUSP
10.0000 mL | Freq: Four times a day (QID) | ORAL | Status: DC | PRN
  Filled 2020-04-23: qty 30

## 2020-04-23 MED ORDER — SEVELAMER CARBONATE 800 MG PO TABS
3200.0000 mg | ORAL_TABLET | Freq: Three times a day (TID) | ORAL | Status: DC
  Administered 2020-04-24 – 2020-05-02 (×23): 3200 mg via ORAL
  Filled 2020-04-23 (×26): qty 4

## 2020-04-23 MED ORDER — DIPHENHYDRAMINE HCL 12.5 MG/5ML PO ELIX: 12.5000 mg | ORAL_SOLUTION | Freq: Four times a day (QID) | ORAL | Status: DC | PRN

## 2020-04-23 MED ORDER — OXYCODONE-ACETAMINOPHEN 5-325 MG PO TABS
1.0000 | ORAL_TABLET | ORAL | Status: DC | PRN
  Administered 2020-04-24 – 2020-04-27 (×7): 2 via ORAL
  Administered 2020-04-27: 1 via ORAL
  Administered 2020-04-27 – 2020-04-29 (×6): 2 via ORAL
  Administered 2020-04-29: 1 via ORAL
  Administered 2020-04-30 – 2020-05-02 (×6): 2 via ORAL
  Filled 2020-04-23 (×18): qty 2
  Filled 2020-04-23: qty 1
  Filled 2020-04-23 (×5): qty 2
  Filled 2020-04-23: qty 1
  Filled 2020-04-23 (×2): qty 2

## 2020-04-23 MED ORDER — PROSOURCE PLUS PO LIQD
30.0000 mL | Freq: Two times a day (BID) | ORAL | Status: DC
  Administered 2020-04-24 – 2020-05-02 (×17): 30 mL via ORAL
  Filled 2020-04-23 (×15): qty 30

## 2020-04-23 MED ORDER — WARFARIN - PHARMACIST DOSING INPATIENT: Freq: Every day | Status: DC

## 2020-04-23 MED ORDER — PROCHLORPERAZINE 25 MG RE SUPP: 12.5000 mg | Freq: Four times a day (QID) | RECTAL | Status: DC | PRN

## 2020-04-23 MED ORDER — PROCHLORPERAZINE EDISYLATE 10 MG/2ML IJ SOLN: 5.0000 mg | Freq: Four times a day (QID) | INTRAMUSCULAR | Status: DC | PRN

## 2020-04-23 MED ORDER — POLYETHYLENE GLYCOL 3350 17 G PO PACK: 17.0000 g | PACK | Freq: Every day | ORAL | Status: DC | PRN

## 2020-04-23 MED ORDER — GUAIFENESIN-DM 100-10 MG/5ML PO SYRP: 5.0000 mL | ORAL_SOLUTION | Freq: Four times a day (QID) | ORAL | Status: DC | PRN

## 2020-04-23 MED ORDER — EZETIMIBE 10 MG PO TABS
10.0000 mg | ORAL_TABLET | Freq: Every day | ORAL | Status: DC
  Administered 2020-04-24 – 2020-05-02 (×9): 10 mg via ORAL
  Filled 2020-04-23 (×9): qty 1

## 2020-04-23 MED ORDER — MILK AND MOLASSES ENEMA
1.0000 | Freq: Every day | RECTAL | Status: DC | PRN
  Filled 2020-04-23: qty 240

## 2020-04-23 MED ORDER — METHOCARBAMOL 500 MG PO TABS
500.0000 mg | ORAL_TABLET | Freq: Three times a day (TID) | ORAL | Status: DC
  Administered 2020-04-24 – 2020-05-02 (×20): 500 mg via ORAL
  Filled 2020-04-23 (×22): qty 1

## 2020-04-23 MED ORDER — DARBEPOETIN ALFA 100 MCG/0.5ML IJ SOSY
100.0000 ug | PREFILLED_SYRINGE | INTRAMUSCULAR | Status: DC
  Filled 2020-04-23: qty 0.5

## 2020-04-23 MED ORDER — ROSUVASTATIN CALCIUM 20 MG PO TABS
40.0000 mg | ORAL_TABLET | Freq: Every day | ORAL | Status: DC
  Administered 2020-04-24 – 2020-05-01 (×8): 40 mg via ORAL
  Filled 2020-04-23 (×9): qty 2

## 2020-04-23 MED ORDER — BISACODYL 10 MG RE SUPP: 10.0000 mg | Freq: Every day | RECTAL | Status: DC | PRN

## 2020-04-23 MED ORDER — PROCHLORPERAZINE MALEATE 5 MG PO TABS
5.0000 mg | ORAL_TABLET | Freq: Four times a day (QID) | ORAL | Status: DC | PRN
  Administered 2020-04-25 – 2020-05-01 (×5): 10 mg via ORAL
  Filled 2020-04-23 (×5): qty 2

## 2020-04-23 MED ORDER — ACETAMINOPHEN 500 MG PO TABS
500.0000 mg | ORAL_TABLET | ORAL | Status: DC | PRN
  Administered 2020-04-24 – 2020-04-28 (×5): 500 mg via ORAL
  Filled 2020-04-23 (×6): qty 1

## 2020-04-23 MED ORDER — CALCITRIOL 0.25 MCG PO CAPS
1.0000 ug | ORAL_CAPSULE | ORAL | Status: DC
  Administered 2020-04-25 – 2020-04-27 (×2): 1 ug via ORAL
  Filled 2020-04-23 (×2): qty 4

## 2020-04-23 MED ORDER — POLYETHYLENE GLYCOL 3350 17 G PO PACK
17.0000 g | PACK | Freq: Every day | ORAL | Status: DC
  Administered 2020-04-24 – 2020-05-02 (×9): 17 g via ORAL
  Filled 2020-04-23 (×9): qty 1

## 2020-04-23 MED ORDER — METOPROLOL TARTRATE 50 MG PO TABS
75.0000 mg | ORAL_TABLET | Freq: Two times a day (BID) | ORAL | Status: DC
  Administered 2020-04-24 – 2020-05-02 (×14): 75 mg via ORAL
  Filled 2020-04-23 (×18): qty 1

## 2020-04-23 MED ORDER — PANTOPRAZOLE SODIUM 40 MG PO TBEC
40.0000 mg | DELAYED_RELEASE_TABLET | Freq: Every day | ORAL | Status: DC
  Administered 2020-04-24 – 2020-05-02 (×9): 40 mg via ORAL
  Filled 2020-04-23 (×9): qty 1

## 2020-04-23 MED ORDER — LAMOTRIGINE 100 MG PO TABS
50.0000 mg | ORAL_TABLET | Freq: Two times a day (BID) | ORAL | Status: DC
  Administered 2020-04-24 – 2020-05-02 (×18): 50 mg via ORAL
  Filled 2020-04-23 (×18): qty 1

## 2020-04-23 MED ORDER — TRAZODONE HCL 50 MG PO TABS
25.0000 mg | ORAL_TABLET | Freq: Every day | ORAL | Status: DC
  Administered 2020-04-24 – 2020-05-02 (×9): 25 mg via ORAL
  Filled 2020-04-23 (×9): qty 1

## 2020-04-23 MED ORDER — SIMETHICONE 80 MG PO CHEW: 80.0000 mg | CHEWABLE_TABLET | Freq: Four times a day (QID) | ORAL | Status: DC | PRN

## 2020-04-23 NOTE — Progress Notes (Signed)
Inpatient Rehabilitation Admissions Coordinator   I have CIR bed to admit patient today. I spoke with patient by phone and he is in agreement to admit. I have notified hemodialysis of planned admit, acute team and TOC. I will make the arrangements to admit today.  Danne Baxter, RN, MSN Rehab Admissions Coordinator [phone] 04/23/2020 10:16 AM

## 2020-04-23 NOTE — Plan of Care (Signed)
  Problem: Education: Goal: Knowledge of General Education information will improve Description Including pain rating scale, medication(s)/side effects and non-pharmacologic comfort measures Outcome: Progressing   Problem: Health Behavior/Discharge Planning: Goal: Ability to manage health-related needs will improve Outcome: Progressing   

## 2020-04-23 NOTE — TOC Transition Note (Signed)
Transition of Care (TOC) - CM/SW Discharge Note Marvetta Gibbons RN, BSN Transitions of Care Unit 4E- RN Case Manager See Treatment Team for direct phone #    Patient Details  Name: Jeremy [name] MRN: [id number] Date of Birth: [birth year]  Transition of Care [facility]) CM/SW Contact:  Dawayne Patricia, RN Phone Number: 04/23/2020, 10:52 AM   Clinical Narrative:    Notified by [facility] that pt will have bed available for transition to [facility] today. Pt agreeable and stable for transition. Plan to go to [facility] later this afternoon.    Final next level of care: IP Rehab Facility Barriers to Discharge: No Barriers Identified   Patient Goals and CMS Choice Patient states their goals for this hospitalization and ongoing recovery are:: rehab CMS Medicare.gov Compare Post Acute Care list provided to:: Patient Choice offered to / list presented to : Patient  Discharge Placement               [facility]        Discharge Plan and Services   Discharge Planning Services: CM Consult Post Acute Care Choice: IP Rehab          DME Arranged: N/A DME Agency: NA       HH Arranged: NA HH Agency: NA        Social Determinants of Health (SDOH) Interventions     Readmission Risk Interventions Readmission Risk Prevention Plan 04/23/2020 10/06/2019 10/06/2019  Transportation Screening Complete - Complete  PCP or Specialist Appt within 3-5 Days Not Complete - -  Not Complete comments INPT rehab - -  [place] or [place] Complete Complete Complete  Social Work Consult for [place] Planning/Counseling Complete - -  Palliative Care Screening Not Applicable Not Applicable Not Applicable  Medication Review (RN Care Manager) Complete Complete Complete  PCP or Specialist appointment within 3-5 days of discharge - - -  [place] recent data might be hidden

## 2020-04-23 NOTE — Progress Notes (Signed)
ANTICOAGULATION CONSULT NOTE - Follow Up Consult  Pharmacy Consult for Warfarin Indication: Afib/AVR  Labs: Recent Labs    04/20/20 1119 04/20/20 1120 04/21/20 0143 04/22/20 0128 04/23/20 0112  HGB 8.5*  --   --  8.9* 9.1*  HCT 26.5*  --   --  28.1* 30.0*  PLT 128*  --   --  147* 150  LABPROT  --   --  33.2* 37.1* 47.4*  INR  --   --  3.4* 3.9* 5.4*  CREATININE  --    < > 5.26* 7.88* 9.93*   < > = values in this interval not displayed.    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 He is on warfarin 1mg  daily PTA for mech AVR/afib and this was restarted 1/10 (no longer on IV heparin).- -INR up to 5.4 (no medication causes noted)  Goal of Therapy:  INR goal 2.5 to 3.5   Plan:  -Hold warfarin again today -Follow daily PT/INR  Hildred Laser, PharmD Clinical Pharmacist **Pharmacist phone directory can now be found on [email or web address] ([email or web address]).  Listed under [place].

## 2020-04-23 NOTE — Progress Notes (Signed)
CRITICAL VALUE ALERT  Critical Value:  INR 5.4   Date & Time Notified:  04/23/20   3:55  Provider Notified: Dr.Cain  Orders Received/Actions taken: No new orders at this time

## 2020-04-23 NOTE — Care Management Important Message (Signed)
Important Message  Patient Details  Name: Jeremy [name] MRN: [id number] Date of Birth: 07/27/1955   Medicare Important Message Given:  Yes - Important Message mailed due to current National Emergency  Verbal consent obtained due to current National Emergency  Relationship to patient: Self Contact Name: Darryl Willner Call Date: 04/23/20  Time: 1441 Phone: [phone] Outcome: Spoke with contact Important Message mailed to: Patient address on file    Delorse Lek 04/23/2020, 2:42 PM

## 2020-04-23 NOTE — Progress Notes (Signed)
Nephrology Follow-Up Consult Note   Assessment/Recommendations: Jeremy [name] is a/an 65 y.o. male with a past medical history significant for ESRD on HD admitted for RLE amputation.   # ESRD: MWF schedule, outpatient unit: [place]. 3.75hrs, 2K, 2.25Ca, Na 137, Bicarbonate 35, Dialyzer: F180.  - Continue HD MWF schedule - will change to 2K/2Ca for today's tx as shortened times and post-weekend  # Volume/ hypertension: optimize volume with HD   # Anemia of Chronic Kidney Disease: Currently receiving Epo 8000 units 3x weekly as an outpatient. Aranesp 3mcg 1/10, qmonday - increase dose to 100 mcg.  Adjusted order and spoke with pharmacy  # Secondary Hyperparathyroidism/Hyperphosphatemia: Home calcitriol 70mcg MWF  # Vascular access: LUE AVF with no issues  # RLE ischemia. 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  Pain control and dressing changes per primary. 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 note bcx drawn given h/o mechanical aortic valve-neg. Possible cavitary appearing structure on cxr, ct chest does not reveal a cavitary lesion.  _____________________________________________________  CC: ESRD  Interval History/Subjective:  Feels well.  Moving to rehab soon.  last HD on 1/14 with 2.4 liters UF.   Review of systems:  Denies shortness of breath or chest pain Denies n/v   Medications:  Current Facility-Administered Medications  Medication Dose Route Frequency Provider Last Rate Last Admin  . acetaminophen (TYLENOL) tablet 500 mg  500 mg Oral Q4H PRN Rosetta Posner, MD   500 mg at 04/20/20 0450  . calcitRIOL (ROCALTROL) capsule 1 mcg  1 mcg Oral Q M,W,F Ulyses Amor, PA-C   1 mcg at 04/23/20 3536  . Chlorhexidine Gluconate Cloth 2 % PADS 6 each  6 each Topical Q0600 Ulyses Amor, PA-C   6 each at 04/23/20 [phone]  . Darbepoetin Alfa (ARANESP) injection 60 mcg   60 mcg Intravenous Q Mon-HD Laurence Slate M, PA-C   60 mcg at 04/16/20 1034  . ezetimibe (ZETIA) tablet 10 mg  10 mg Oral Daily Laurence Slate M, PA-C   10 mg at 04/23/20 0919  . lamoTRIgine (LAMICTAL) tablet 50 mg  50 mg Oral BID Laurence Slate M, PA-C   50 mg at 04/23/20 0919  . methocarbamol (ROBAXIN) tablet 500 mg  500 mg Oral TID Ulyses Amor, PA-C   500 mg at 04/23/20 1540  . metoprolol tartrate (LOPRESSOR) injection 5 mg  5 mg Intravenous Q6H PRN Marty Heck, MD   5 mg at 04/20/20 1523  . metoprolol tartrate (LOPRESSOR) tablet 75 mg  75 mg Oral BID Ulyses Amor, PA-C   75 mg at 04/22/20 2104  . oxyCODONE-acetaminophen (PERCOCET/ROXICET) 5-325 MG per tablet 1-2 tablet  1-2 tablet Oral Q4H PRN Ulyses Amor, PA-C   2 tablet at 04/23/20 [phone]  . pantoprazole (PROTONIX) EC tablet 40 mg  40 mg Oral Daily Laurence Slate M, PA-C   40 mg at 04/23/20 0919  . polyethylene glycol (MIRALAX / GLYCOLAX) packet 17 g  17 g Oral Daily Dagoberto Ligas, PA-C   17 g at 04/23/20 6195  . rosuvastatin (CRESTOR) tablet 40 mg  40 mg Oral q1800 Ulyses Amor, PA-C   40 mg at 04/22/20 1725  . sevelamer carbonate (RENVELA) tablet 3,200 mg  3,200 mg Oral TID WC Laurence Slate M, PA-C   3,200 mg at 04/23/20 1300  . simethicone (MYLICON) chewable tablet 80  mg  80 mg Oral QID PRN Ulyses Amor, PA-C   80 mg at 04/23/20 6815  . traZODone (DESYREL) tablet 25 mg  25 mg Oral QHS Laurence Slate M, PA-C   25 mg at 04/22/20 2102  . Warfarin - Pharmacist Dosing Inpatient   Does not apply q1600 Marty Heck, MD         Physical Exam: Vitals:   04/23/20 0834 04/23/20 1306  BP: (!) 149/99 122/74  Pulse: (!) 109 (!) 112  Resp: 18 18  Temp: 98.8 F (37.1 C) 98.2 F (36.8 C)  SpO2: 100% 100%   No intake/output data recorded. No intake or output data in the 24 hours ending 04/23/20 1333 Constitutional: well-appearing, no acute distress CV: S1S2 no rub Respiratory: clear and  unlabored Gastrointestinal: soft, non-tender, nondistended Ext: RLE absent below the knee, lue avf +b/t neuro: alert and oriented x3 conversant and follows commands Psych normal mood and affect   Test Results I personally reviewed new and old clinical labs and radiology tests Lab Results  Component Value Date   NA 138 04/23/2020   K 4.2 04/23/2020   CL 95 (L) 04/23/2020   CO2 27 04/23/2020   BUN 45 (H) 04/23/2020   CREATININE 9.93 (H) 04/23/2020   GLU 98 04/14/2019   CALCIUM 10.3 04/23/2020   ALBUMIN 2.5 (L) 04/23/2020   PHOS 5.2 (H) 04/23/2020    Claudia Desanctis, MD 04/23/2020  1:46 PM

## 2020-04-23 NOTE — Progress Notes (Signed)
Report called to [facility] RN on 4MW, patient waiting for dialysis treatment before transfer.  Natalina Wieting, Tivis Ringer, RN

## 2020-04-23 NOTE — H&P (Signed)
Physical Medicine and Rehabilitation Admission H&P    CC: Functional deficits due to R-BKA   HPI: Jeremy [name] is a 65 year old male with history of renal cell CA s/p failed transplant--HD-MWF , mechanical AVR/A fib-on coumadin, OSA, PVD with multiple stents LLE and right foot ulcer s/p angio and stenting in attempts at limb salvage. He was admitted on 04/12/20 with critical limb ischemia with right foot gangrene and BKA recommended. 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 Carlis Abbott. Post op course significant for A fib with RVR requiring IV BB, poor pain control as well as fevers on 01/11-->. BC X 2 negative. CXR showed question of LLL cavitary mass but CT chest negative.  Coumadin being managed with pharmacy but INR has been labile --received vitamin K for INR up to 4.7--INR back up to 5.4 today. Duskiness around R-BKA stump being monitored due to concerns of needing revision but felt to be due to anticoagulation. Patient continues to be limited by R-BKA, BUE weakness as well as decreased activity tolerance. CIR recommended due to functional decline.    Of note, pt reports 5 stents in LLE and 2 balloons in RLE- thinks "duskiness" of R BKA due to anticoagulation- but also has some unpopped blisters.    Pt reports he doesn't void at all.  LBM yesterday Denies a lot of pain, but has been having some phantom pain- the pain meds help some.    Review of Systems  Constitutional: Negative for chills and fever.  HENT: Negative for hearing loss.   Eyes: Negative for blurred vision and double vision.  Respiratory: Positive for shortness of breath (with increased activity). Negative for cough.   Cardiovascular: Negative for chest pain and palpitations.  Genitourinary: Negative.        Does not produce urine.   Musculoskeletal: Positive for joint pain (left knee numbness --new since admission. ). Negative for back pain, myalgias and neck pain.  Neurological: Positive for  dizziness and sensory change (LLE--since aneurysm/hematoma. ).  Psychiatric/Behavioral: The patient is not nervous/anxious.   All other systems reviewed and are negative.    Past Medical History:  Diagnosis Date  . Abnormal stress test 03/19/2018  . 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 Aortic stenosis    a. 2014 s/p mech AVR ([facility])-->chronic coumadin; b. 08/2018 Echo: Mild AS/AI. Nl fxn/gradient.  . Arthritis   . Atrial fibrillation ([place])   . CAD (coronary artery disease)    a. 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 02/2018 MV [facility]): large inf, infsept, inflat infarct w/ min rev, EF 30%; c. 06/2018 Cath: LM min irregs, LAD 60p, 95/30/59m, LCX min irregs, RCA 40p, 59m, 90d, RPAV 90, RPDA 95ost; d. 09/2018 CABGx3 (LIMA->LAD, VG->D1, VG->PDA).  . Chronic anticoagulation 06/2012   a. Coumadin in the setting of mech AVR and afib.  Marland Kitchen COVID-19 03/15/2019  . CPAP (continuous positive airway pressure) dependence   . Dialysis patient [facility]) 04/07/2006   Patient started HD around 1996,  had 1st transplant LLQ around 1998 until 2007 when they found renal cell cancer in transplant and both native kidneys, all were removed > went back on HD until 2nd transplant RLQ in 2014 which lasted 3 yrs; it was embolized for suspected acute rejection. Is currently on HD MWF in [place] w/ [facility] nephrology.  . ESRD (end stage renal disease) ([place])  a. CKD 2/2 to IgA nephropathy (dx age 88); b. 1999 s/p Kidney transplant; c. 2014 Bilat nephrectomy (transplanted kidney resected) in the setting of renal cell carcinoma; d. PD until 11/2017-->HD.  Marland Kitchen Heart murmur   . HFrEF (heart failure with reduced ejection fraction) ([place])    a. 07/2016 Echo: EF 55-60%; b. 02/2018 MV: EF 30%; c. 04/2018 Echo: EF 35%; d. 08/2018 Echo: EF 30-35%. Glob HK w/o rwma. Mildly reduced RV fxn. Mod to sev dil LA. Nl AoV fxn.  Marland Kitchen Hx of colonoscopy    2009 by Dr. Laural Golden. Normal  except for small submucosal lipoma. No evidence of polyps or colitis.  . Hyperlipidemia   . Hypertension   . Hypogonadism in male 07/25/2015  . Ischemic cardiomyopathy    a. 07/2016 Echo: EF 55-60%; b. 02/2018 MV: EF 30%; c. 04/2018 Echo: EF 35%; d. 08/2018 Echo: EF 30-35%.  . OSA (obstructive sleep apnea)    No CPAP  . Permanent atrial fibrillation (H[facility])    a. CHA2DS2VASc = 3-->Coumadin (also mech AVR); b. 06/2018 Amio started 2/2 elevated rates.  . Renal cell cancer ([place])   . Renal cell carcinoma ([place]) 08/04/2016    Past Surgical History:  Procedure Laterality Date  . ABDOMINAL AORTOGRAM W/LOWER EXTREMITY N/A 12/22/2019   Procedure: ABDOMINAL AORTOGRAM W/LOWER EXTREMITY;  Surgeon: Marty Heck, MD;  Location: [place] [facility];  Service: Cardiovascular;  Laterality: N/A;  . AMPUTATION Right 04/14/2020   Procedure: RIGHT BELOW KNEE AMPUTATION;  Surgeon: Marty Heck, MD;  Location: [place];  Service: Vascular;  Laterality: Right;  . AORTIC VALVE REPLACEMENT  3 /11 /2014   At [facility]    . CAPD INSERTION N/A 02/19/2017   Procedure: LAPAROSCOPIC INSERTION CONTINUOUS AMBULATORY PERITONEAL DIALYSIS  (CAPD) CATHETER;  Surgeon: Algernon Huxley, MD;  Location: [facility];  Service: General;  Laterality: N/A;  . CORONARY ANGIOPLASTY WITH STENT PLACEMENT    . CORONARY ARTERY BYPASS GRAFT N/A 09/16/2018   Procedure: CORONARY ARTERY BYPASS GRAFT times three      with left internal mammary artery and endoharvest of right leg greater saphenous vein;  Surgeon: Melrose Nakayama, MD;  Location: [place];  Service: Open Heart Surgery;  Laterality: N/A;  . CORONARY STENT INTERVENTION N/A 07/15/2019   Procedure: CORONARY STENT INTERVENTION;  Surgeon: Wellington Hampshire, MD;  Location: [place] [facility];  Service: Cardiovascular;  Laterality: N/A;  . DG AV DIALYSIS  SHUNT ACCESS EXIST*L* OR     left arm  . EPIGASTRIC HERNIA REPAIR N/A 02/19/2017    Procedure: HERNIA REPAIR EPIGASTRIC ADULT;  Surgeon: Robert Bellow, MD;  Location: [facility];  Service: General;  Laterality: N/A;  . EYE SURGERY     bilateral cataract  . HERNIA REPAIR     UMBILICAL HERNIA  . HERNIA REPAIR  02/19/2017  . INNER EAR SURGERY     20 years ago  . KIDNEY TRANSPLANT    . LEFT HEART CATH AND CORS/GRAFTS ANGIOGRAPHY N/A 07/15/2019   Procedure: LEFT HEART CATH AND CORS/GRAFTS ANGIOGRAPHY;  Surgeon: Wellington Hampshire, MD;  Location: [place] [facility];  Service: Cardiovascular;  Laterality: N/A;  . LOWER EXTREMITY ANGIOGRAPHY Right 08/25/2019   Procedure: LOWER EXTREMITY ANGIOGRAPHY;  Surgeon: Algernon Huxley, MD;  Location: [place] [facility];  Service: Cardiovascular;  Laterality: Right;  . LOWER EXTREMITY ANGIOGRAPHY Left 09/29/2019   Procedure: Lower Extremity Angiography;  Surgeon: Algernon Huxley, MD;  Location: [place] [facility];  Service: Cardiovascular;  Laterality: Left;  . PERIPHERAL VASCULAR INTERVENTION Right 12/22/2019   Procedure: PERIPHERAL VASCULAR INTERVENTION;  Surgeon: Marty Heck, MD;  Location: [place] [facility];  Service: Cardiovascular;  Laterality: Right;  . Peritoneal Dialysis access placement  02/19/2017  . PSEUDOANERYSM COMPRESSION N/A 09/29/2019   Procedure: PSEUDOANERYSM COMPRESSION;  Surgeon: Algernon Huxley, MD;  Location: [place] [facility];  Service: Cardiovascular;  Laterality: N/A;  . REMOVAL OF A DIALYSIS CATHETER N/A 11/23/2017   Procedure: REMOVAL OF A PERITONEAL DIALYSIS CATHETER;  Surgeon: Algernon Huxley, MD;  Location: [facility];  Service: Vascular;  Laterality: N/A;  . RIGHT/LEFT HEART CATH AND CORONARY ANGIOGRAPHY Bilateral 06/07/2018   Procedure: RIGHT/LEFT HEART CATH AND CORONARY ANGIOGRAPHY;  Surgeon: Wellington Hampshire, MD;  Location: [place] [facility];  Service: Cardiovascular;  Laterality: Bilateral;  . TEE WITHOUT CARDIOVERSION N/A 07/21/2016   Procedure: TRANSESOPHAGEAL ECHOCARDIOGRAM (TEE);  Surgeon: Wellington Hampshire, MD;  Location: [facility];  Service: Cardiovascular;  Laterality: N/A;  . TEE WITHOUT CARDIOVERSION N/A 09/16/2018   Procedure: TRANSESOPHAGEAL ECHOCARDIOGRAM (TEE);  Surgeon: Melrose Nakayama, MD;  Location: [place];  Service: Open Heart Surgery;  Laterality: N/A;    Family History  Problem Relation Age of Onset  . Cancer Mother        pancreatic  . Heart disease Father   . Diabetes Father     Social History:  Lives with GF. Worked for [employer]. He reports that he has never smoked. He has never used smokeless tobacco. He reports previous alcohol use. He reports that he does not use drugs.    Allergies  Allergen Reactions  . Statins Other (See Comments)    Arthralgias (intolerance), Myalgias (intolerance)  Pt taking pravastatin   . Gabapentin     Drowsy   . Sertraline Rash    Facility-Administered Medications Prior to Admission  Medication Dose Route Frequency Provider Last Rate Last Admin  . 0.9 %  sodium chloride infusion  250 mL Intravenous PRN Marty Heck, MD       Medications Prior to Admission  Medication Sig Dispense Refill  . acetaminophen (TYLENOL) 500 MG tablet Take 1,000 mg 2 (two) times daily as needed by mouth for moderate pain.    Marland Kitchen allopurinol (ZYLOPRIM) 300 MG tablet Take 300 mg by mouth daily.  5  . cinacalcet (SENSIPAR) 30 MG tablet Take 30 mg by mouth daily.     . clopidogrel (PLAVIX) 75 MG tablet Take 1 tablet (75 mg total) by mouth daily with breakfast. 90 tablet 3  . ezetimibe (ZETIA) 10 MG tablet TAKE 1 TABLET BY MOUTH EVERY DAY (Patient taking differently: Take 10 mg by mouth daily.) 90 tablet 1  . hydroxychloroquine (PLAQUENIL) 200 MG tablet Take 400 mg by mouth at bedtime.    . lamoTRIgine (LAMICTAL) 25 MG tablet Take 2 tablets (50 mg total) by mouth 2 (two) times daily. 120 tablet 5  . lidocaine-prilocaine (EMLA) cream Apply 1 application topically daily as needed (for fistula).    . loperamide (IMODIUM A-D) 2 MG tablet Take 2 mg by  mouth 4 (four) times daily as needed for diarrhea or loose stools.    . metoprolol tartrate 75 MG TABS Take 75 mg by mouth 2 (two) times daily. 180 tablet 3  . midodrine (PROAMATINE) 10 MG tablet Take 10 mg by mouth every Monday, Wednesday, and Friday.    . multivitamin (RENA-VIT) TABS tablet Take 1  tablet by mouth daily.    . nitroGLYCERIN (NITROSTAT) 0.4 MG SL tablet Place 1 tablet (0.4 mg total) under the tongue every 5 (five) minutes as needed for chest pain. 25 tablet 3  . oxyCODONE (OXY IR/ROXICODONE) 5 MG immediate release tablet Take 1-2 tablets (5-10 mg total) by mouth every 4 (four) hours as needed for severe pain. 30 tablet 0  . pantoprazole (PROTONIX) 40 MG tablet Take 1 tablet (40 mg total) by mouth daily. 90 tablet 3  . predniSONE (DELTASONE) 5 MG tablet Take 5 mg by mouth daily.    . rosuvastatin (CRESTOR) 40 MG tablet Take 1 tablet (40 mg total) by mouth daily at 6 PM. 90 tablet 3  . sevelamer carbonate (RENVELA) 800 MG tablet Take 3,200 mg by mouth 3 (three) times daily with meals.    . traZODone (DESYREL) 50 MG tablet TAKE 1/2 -1 TABLET (25-50 MG TOTAL) BY MOUTH AT BEDTIME AS NEEDED FOR SLEEP. (Patient taking differently: Take 25-50 mg by mouth at bedtime.) 90 tablet 2  . azelastine (ASTELIN) 0.1 % nasal spray PLACE 1 SPRAY INTO BOTH NOSTRILS 2 (TWO) TIMES DAILY. USE IN EACH NOSTRIL AS DIRECTED (Patient not taking: No sig reported) 30 mL 3  . benzonatate (TESSALON) 200 MG capsule Take 1 capsule (200 mg total) by mouth 3 (three) times daily as needed for cough. (Patient not taking: Reported on 04/11/2020) 30 capsule 1  . Epoetin Alfa-epbx (RETACRIT IJ) Epoetin alfa - epbx (Retacrit)    . iron sucrose in sodium chloride 0.9 % 100 mL Iron Sucrose (Venofer)    . warfarin (COUMADIN) 1 MG tablet TAKE 1 MG DAILY ON EVERY DAY EXCEPT TAKE 0.5 MG ON FRIDAYS. *OR AS DIRECTED* (Patient taking differently: Take 1 mg by mouth daily.) 90 tablet 3    Drug Regimen Review  Drug regimen was reviewed  and remains appropriate with no significant issues identified  Home: Home Living Family/patient expects to be discharged to:: Private residence Living Arrangements: Spouse/significant other (girlfriend, Veterinary surgeon) Available Help at Discharge: Available PRN/intermittently Type of Home: House Home Access: Stairs to enter [employer] of Steps: 1 Home Layout: One level,Able to live on main level with bedroom/bathroom Bathroom Shower/Tub: Multimedia programmer: Standard Bathroom Accessibility: Yes Home Equipment: Environmental consultant - 2 wheels,Walker - 4 wheels,Cane - single point  Lives With: Significant other   Functional History: Prior Function Level of Independence: Independent with assistive device(s) Comments: Mod I with ADLs/IADLs and use of rollator. Patient was driving.  Functional Status:  Mobility: Bed Mobility Overal bed mobility: Needs Assistance Bed Mobility: Rolling (scooting up) Rolling: Supervision Supine to sit: Min guard Sit to supine: Min guard General bed mobility comments: supervision for safety and needs of pt Transfers Overall transfer level: Needs assistance Equipment used: Rolling walker (2 wheeled) Transfers: Sit to/from [employer] Sit to Stand: Mod assist Stand pivot transfers: Mod assist General transfer comment: declines OOB after his HD Ambulation/Gait Ambulation/Gait assistance: Mod assist Gait Distance (Feet): 4 Feet (+ 10 ft) Assistive device: Rolling walker (2 wheeled) Gait Pattern/deviations: Step-to pattern,Trunk flexed,Antalgic General Gait Details: Cues for hip extension and upper trunk control this session.  Pt fatigues quickly with short bout of gt training in room.    ADL: ADL Overall ADL's : Needs assistance/impaired Grooming: Set up,Sitting Lower Body Dressing: Moderate assistance,Sit to/from stand Lower Body Dressing Details (indicate cue type and reason): Able to doff L sock and don L shoe with set-up  assist. Would likely require Mod A to  thread BLE and hike LB clothing over hips in standing with assist to maintain standing balance. Toilet Transfer: Moderate assistance,Stand-pivot Toilet Transfer Details (indicate cue type and reason): Simulated with stand-pivot transfer to recliner. General ADL Comments: Pt recently back to bed after sitting up in recliner and PT session but agreeable to OT session. completed bed mobility 3x sit<>stands EOB, pivotal steps to simunlate toilet transfer to [facility].  Cognition: Cognition Overall Cognitive Status: Within Functional Limits for tasks assessed Orientation Level: Oriented X4 Cognition Arousal/Alertness: Awake/alert Behavior During Therapy: WFL for tasks assessed/performed Overall Cognitive Status: Within Functional Limits for tasks assessed General Comments: Drowsy from pain medications, intermittently falling asleep   Blood pressure 122/74, pulse (!) 112, temperature 98.2 F (36.8 C), temperature source Oral, resp. rate 18, height 5\' 10"  (1.778 m), weight 82.7 kg, SpO2 100 %. Physical Exam Vitals and nursing note reviewed.  Constitutional:      Appearance: Normal appearance.     Comments: Older male sitting up in bed, appropriate, appears younger than stated age, comfortable, NAD  HENT:     Head: Normocephalic and atraumatic.     Comments: Smile equal- conjugate gaze    Right Ear: External ear normal.     Left Ear: External ear normal.     Nose: Nose normal. No congestion.     Mouth/Throat:     Mouth: Mucous membranes are dry.     Pharynx: Oropharynx is clear. No oropharyngeal exudate.  Eyes:     General:        Right eye: No discharge.        Left eye: No discharge.     Extraocular Movements: Extraocular movements intact.  Cardiovascular:     Rate and Rhythm: Tachycardia present. Rhythm irregular.     Comments: Rate in low 100s- irregular /afib; murmur from mechanical valve Pulmonary:     Comments: CTA B/L- no W/R/R- good air  movement Abdominal:     Comments: Soft, NT, ND, (+)BS - hypoactive  Musculoskeletal:     Cervical back: Normal range of motion. No rigidity.     Left lower leg: No edema.     Comments: UEs- 5/5 in biceps, triceps, WE and grip B/L LLE- 5/5 in HF, KE, KF, DF and PF RLE-  HF and KE 5-/5- not limited a lot by pain R BKA- wrapped Crooked and stiff fingers  Skin:    General: Skin is warm and dry.     Comments: Thrill in fistula in LUE- forearm R forearm IV- looks OK A lot of purple bruising around R BKA- staples intac-t no drainage; a few blisters around medial staples line; dog ears still notable Little scab near R thumb base- small  Neurological:     Mental Status: He is oriented to person, place, and time.     Comments: Decreased sensation to light touch in LLE and tip of R BKA Normal sensation in R thigh Ox3  Psychiatric:     Comments: appropriate       Results for orders placed or performed during the hospital encounter of 04/12/20 (from the past 48 hour(s))  Glucose, capillary     Status: Abnormal   Collection Time: 04/21/20  4:13 PM  Result Value Ref Range   Glucose-Capillary 106 (H) 70 - 99 mg/dL    Comment: Glucose reference range applies only to samples taken after fasting for at least 8 hours.  Protime-INR     Status: Abnormal   Collection Time: 04/22/20  1:28 AM  Result Value Ref Range   Prothrombin Time 37.1 (H) 11.4 - 15.2 seconds   INR 3.9 (H) 0.8 - 1.2    Comment: (NOTE) INR goal varies based on device and disease states. Performed at [facility], [place] [address] [place], [place] [address]   Renal function panel     Status: Abnormal   Collection Time: 04/22/20  1:28 AM  Result Value Ref Range   Sodium 139 135 - 145 mmol/L   Potassium 3.8 3.5 - 5.1 mmol/L   Chloride 97 (L) 98 - 111 mmol/L   CO2 27 22 - 32 mmol/L   Glucose, Bld 138 (H) 70 - 99 mg/dL    Comment: Glucose reference range applies only to samples taken after fasting for at least 8 hours.    BUN 37 (H) 8 - 23 mg/dL   Creatinine, Ser 7.88 (H) 0.61 - 1.24 mg/dL   Calcium 10.3 8.9 - 10.3 mg/dL   Phosphorus 4.5 2.5 - 4.6 mg/dL   Albumin 2.4 (L) 3.5 - 5.0 g/dL   GFR, Estimated 7 (L) >60 mL/min    Comment: (NOTE) Calculated using the CKD-EPI Creatinine Equation (2021)    Anion gap 15 5 - 15    Comment: Performed at [place] [address], [place], [name] [address]  CBC     Status: Abnormal   Collection Time: 04/22/20  1:28 AM  Result Value Ref Range   WBC 5.9 4.0 - 10.5 K/uL   RBC 2.58 (L) 4.22 - 5.81 MIL/uL   Hemoglobin 8.9 (L) 13.0 - 17.0 g/dL   HCT 28.1 (L) 39.0 - 52.0 %   MCV 108.9 (H) 80.0 - 100.0 fL   MCH 34.5 (H) 26.0 - 34.0 pg   MCHC 31.7 30.0 - 36.0 g/dL   RDW 14.5 11.5 - 15.5 %   Platelets 147 (L) 150 - 400 K/uL   nRBC 0.0 0.0 - 0.2 %    Comment: Performed at [facility], [place] [address] [place], [place] [address]  Protime-INR     Status: Abnormal   Collection Time: 04/23/20  1:12 AM  Result Value Ref Range   Prothrombin Time 47.4 (H) 11.4 - 15.2 seconds   INR 5.4 (HH) 0.8 - 1.2    Comment: REPEATED TO VERIFY CRITICAL RESULT CALLED TO, READ BACK BY AND VERIFIED WITH: RN DARRYL GARDNER BY MESSAN HOUEGNIFIO AT 5784 ON 04/23/2020 (NOTE) INR goal varies based on device and disease states. Performed at [facility], [place] [address], [place], [place] [address]   Renal function panel     Status: Abnormal   Collection Time: 04/23/20  1:12 AM  Result Value Ref Range   Sodium 138 135 - 145 mmol/L   Potassium 4.2 3.5 - 5.1 mmol/L   Chloride 95 (L) 98 - 111 mmol/L   CO2 27 22 - 32 mmol/L   Glucose, Bld 101 (H) 70 - 99 mg/dL    Comment: Glucose reference range applies only to samples taken after fasting for at least 8 hours.   BUN 45 (H) 8 - 23 mg/dL   Creatinine, Ser 9.93 (H) 0.61 - 1.24 mg/dL   Calcium 10.3 8.9 - 10.3 mg/dL   Phosphorus 5.2 (H) 2.5 - 4.6 mg/dL   Albumin 2.5 (L) 3.5 - 5.0 g/dL   GFR, Estimated 5 (L) >60 mL/min     Comment: (NOTE) Calculated using the CKD-EPI Creatinine Equation (2021)    Anion gap 16 (H) 5 -  15    Comment: Performed at [facility], [place] [address] [place], [name] [address]  CBC     Status: Abnormal   Collection Time: 04/23/20  1:12 AM  Result Value Ref Range   WBC 5.7 4.0 - 10.5 K/uL   RBC 2.72 (L) 4.22 - 5.81 MIL/uL   Hemoglobin 9.1 (L) 13.0 - 17.0 g/dL   HCT 30.0 (L) 39.0 - 52.0 %   MCV 110.3 (H) 80.0 - 100.0 fL   MCH 33.5 26.0 - 34.0 pg   MCHC 30.3 30.0 - 36.0 g/dL   RDW 14.6 11.5 - 15.5 %   Platelets 150 150 - 400 K/uL   nRBC 0.0 0.0 - 0.2 %    Comment: Performed at [facility], [place] [address] [place], [place] [address]   *Note: Due to a large number of results and/or encounters for the requested time period, some results have not been displayed. A complete set of results can be found in Results Review.   No results found.     Medical Problem List and Plan: 1.  Impaired function and gait and R BKA secondary to RLE gangrene- still significant vascular changes- picture as above  -patient may  Shower if R BKA is covered  -ELOS/Goals: 10-14 days- mod I to supervision 2.  Mechanical AVR/Antithrombotics: -DVT/anticoagulation:  Pharmaceutical: Coumadin--INR supratherapeutic at 5.4 due to coagulopathy. Coumadin has been held since 01/14  -antiplatelet therapy: N/A 3. Pain Management: Continue robaxin 500 mg tid and oxycodone prn is effective. - did explain phantom pain usually improves by 1 year, phantom sensation is lifetime- might need low dose Cymbalta or gabapentin.   4. Mood: LCSW to follow for evaluation and support.   -antipsychotic agents: N/A 5. Neuropsych: This patient is capable of making decisions on his own behalf. 6. Skin/Wound Care: Monitor wound for healing. Prosource to help promote wound healing.  7. Fluids/Electrolytes/Nutrition: Strict I/O. Renal diet with 1200 cc /FR. 8. Renal cancer s/p B-nephrectomy/ESRD: has fistula, not catheter-   Schedule HD- usually M/W/F- at the end of the day to help with tolerance of therapy. Renevala with meals to manage metabolic bone disease.  9. A fib: Monitor HR tid- on coumadin. Continue Metoprolol bid.  10. Anemia of chronic disease: On aranesp every Monday for supplementation. Monitor for signs of bleeding. Serial H/H with HD. 11. GERD: continue Protonix with simethicone prn.  12. CAD s/p CABG: On Crestor and metoprolol. Monitor for symptoms with activity.  13. OSA: Resume CPAP--reports multiple episodes of apnea last pm.   14. Tachycardia- was tachycardic in 105 range during admission exam- will monitor, to make sure no RVR in setting of Afib.      Bary Leriche, PA-C 04/23/2020    I have personally performed a face to face diagnostic evaluation of this patient and formulated the key components of the plan.  Additionally, I have personally reviewed laboratory data, imaging studies, as well as relevant notes and concur with the physician assistant's documentation above.

## 2020-04-23 NOTE — Progress Notes (Addendum)
  Progress Note    04/23/2020 8:26 AM 9 Days Post-Op  Subjective:  No complaints   Vitals:   04/22/20 1700 04/23/20 0649  BP: (!) 153/84   Pulse: 86   Resp: 16   Temp: 98.2 F (36.8 C) 98.1 F (36.7 C)  SpO2: 100%    Physical Exam: Cardiac:  regular Lungs:  Non labored Incisions:  Right BKA stump with stable ecchymosis   Extremities:  BLE well perfused and warm Neurologic: alert and oriented  CBC    Component Value Date/Time   WBC 5.7 04/23/2020 0112   RBC 2.72 (L) 04/23/2020 0112   HGB 9.1 (L) 04/23/2020 0112   HGB 10.0 (L) 08/30/2014 1013   HCT 30.0 (L) 04/23/2020 0112   HCT 32.2 (L) 08/30/2014 1013   PLT 150 04/23/2020 0112   PLT 125 (L) 08/30/2014 1013   MCV 110.3 (H) 04/23/2020 0112   MCV 88.2 09/16/2014 1041   MCV 89 08/30/2014 1013   MCH 33.5 04/23/2020 0112   MCHC 30.3 04/23/2020 0112   RDW 14.6 04/23/2020 0112   RDW 14.0 08/30/2014 1013   LYMPHSABS 1.2 10/03/2019 0348   LYMPHSABS 0.8 08/30/2014 1013   MONOABS 0.4 10/03/2019 0348   EOSABS 0.1 10/03/2019 0348   EOSABS 0.1 08/30/2014 1013   BASOSABS 0.0 10/03/2019 0348   BASOSABS 0.0 08/30/2014 1013    BMET    Component Value Date/Time   NA 138 04/23/2020 0112   NA 143 04/14/2019 0000   K 4.2 04/23/2020 0112   CL 95 (L) 04/23/2020 0112   CO2 27 04/23/2020 0112   GLUCOSE 101 (H) 04/23/2020 0112   BUN 45 (H) 04/23/2020 0112   BUN 20 04/14/2019 0000   CREATININE 9.93 (H) 04/23/2020 0112   CALCIUM 10.3 04/23/2020 0112   CALCIUM 9.2 12/14/2015 1059   GFRNONAA 5 (L) 04/23/2020 0112   GFRAA 8 (L) 12/23/2019 0858    INR    Component Value Date/Time   INR 5.4 (HH) 04/23/2020 0112   INR 2.3 03/27/2020 0000   INR 2.3 03/27/2020 0000    No intake or output data in the 24 hours ending 04/23/20 0826   Assessment/Plan:  65 y.o. male is s/p right BKA 9 Days Post-Op. Doing well post op. Right BKA is stable with ecchymosis. Pain well controlled. Tolerating ambulation and working with mobility  specialist. Afebrile. VSS. INR 5.4  This morning. Hold Coumadin today. Will follow labs. CIR when bed is available  DVT prophylaxis:  coumadin   [name], PA-C [facility] [phone] 04/23/2020 8:26 AM   I have seen and evaluated the patient. I agree with the PA note as documented above. R BKA has looked ok through weekend.  I think he is ok to proceed to CIR.  Ecchymosis and bruising likely from his anticoagulation.  Pain well controlled.  Marty Heck, MD [facility] Office: [phone]

## 2020-04-23 NOTE — PMR Pre-admission (Signed)
PMR Admission Coordinator Pre-Admission Assessment  Patient: Jeremy [name] is an 65 y.o., male MRN: [id number] DOB: 07-Feb-1956 Height: _0  (177.8 cm) Weight: 82.7 kg  Insurance Information HMO:     PPO:      PCP:      IPA:      80/20:      OTHER:  PRIMARY: Medicare a and b      Policy#: [id number]      Subscriber: pt Benefits:  Phone #: passport one source     Name:  Eff. Date: 8/1/208     Deduct: $1556      Out of Pocket Max: none      Life Max: none CIR: 100%      SNF: 20 full days Outpatient: 80%     Co-Pay: 20% Home Health: 100%      Co-Pay: none DME: 80%     Co-Pay: 20% Providers: pt choice  SECONDARY: [employer] commercial      Policy#: [id number]     Subscriber: [place] Counselor:       Phone#:   The "Data Collection Information Summary" for patients in Inpatient Rehabilitation Facilities with attached "Privacy Act [place] Records" was provided and verbally reviewed with: Patient  Emergency Contact Information Contact Information    Name Relation Home Work Mobile   [place] Significant other [phone]  [phone]   Dorene Sorrow Daughter [phone]  [phone]      Current Medical History  Patient Admitting Diagnosis: BKA  History of Present Illness:   65 year old male with history of renal cell CA s/p failed transplant--HD-MWF , 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 He was admitted on 04/12/20 with critical limb ischemia with right foot gangrene and BKA recommended. 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 Carlis Abbott. Post op course significant for A fib with RVR requiring IV BB, poor pain control as well as fevers on 01/11-->. BC X 2 negative. CXR showed question of LLL cavitary mass but CT chest negative.  Coumadin being managed with pharmacy but INR has been labile --received vitamin K for INR up to 4.7--INR back up to 5.4  today. Duskiness around R-BKA stump being monitored due to concerns of needing revision but felt to be due to anticoagulation. Patient continues to be limited by R-BKA, BUE weakness as well as decreased activity tolerance.    [facility] has been reviewed by the rehabilitation admission coordinator and physician.  Past Medical History  Past Medical History:  Diagnosis Date  . Abnormal stress test 03/19/2018  . 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 Aortic stenosis    a. 2014 s/p mech AVR ([facility])-->chronic coumadin; b. 08/2018 Echo: Mild AS/AI. Nl fxn/gradient.  . Arthritis   . Atrial fibrillation ([place])   . CAD (coronary artery disease)    a. 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 02/2018 MV ([facility]: large inf, infsept, inflat infarct w/ min rev, EF 30%; c. 06/2018 Cath: LM min irregs, LAD 60p, 95/30/89m, LCX min irregs, RCA 40p, 739m90d, RPAV 90, RPDA 95ost; d. 09/2018 CABGx3 (LIMA->LAD, VG->D1, VG->PDA).  . Chronic anticoagulation 06/2012   a. Coumadin in the setting of mech AVR and afib.  . Marland KitchenOVID-19 03/15/2019  . CPAP (continuous positive airway pressure) dependence   .  Dialysis patient [facility]) 04/07/2006   Patient started HD around 1996,  had 1st transplant LLQ around 1998 until 2007 when they found renal cell cancer in transplant and both native kidneys, all were removed > went back on HD until 2nd transplant RLQ in 2014 which lasted 3 yrs; it was embolized for suspected acute rejection. Is currently on HD MWF in [place] w/ [facility] nephrology.  . ESRD (end stage renal disease) ([place])    a. 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 1999 s/p Kidney transplant; c. 2014 Bilat nephrectomy (transplanted kidney resected) in the setting of renal cell carcinoma; d. PD until 11/2017-->HD.  Marland Kitchen Heart murmur   . HFrEF (heart failure with reduced ejection fraction) ([place])    a. 07/2016 Echo: EF 55-60%; b.  02/2018 MV: EF 30%; c. 04/2018 Echo: EF 35%; d. 08/2018 Echo: EF 30-35%. Glob HK w/o rwma. Mildly reduced RV fxn. Mod to sev dil LA. Nl AoV fxn.  Marland Kitchen Hx of colonoscopy    2009 by Dr. Laural Golden. Normal except for small submucosal lipoma. No evidence of polyps or colitis.  . Hyperlipidemia   . Hypertension   . Hypogonadism in male 07/25/2015  . Ischemic cardiomyopathy    a. 07/2016 Echo: EF 55-60%; b. 02/2018 MV: EF 30%; c. 04/2018 Echo: EF 35%; d. 08/2018 Echo: EF 30-35%.  . OSA (obstructive sleep apnea)    No CPAP  . Permanent atrial fibrillation ([facility])    a. CHA2DS2VASc = 3-->Coumadin (also mech AVR); b. 06/2018 Amio started 2/2 elevated rates.  . Renal cell cancer ([place])   . Renal cell carcinoma ([place]) 08/04/2016    Family History   family history includes Cancer in his mother; Diabetes in his father; Heart disease in his father.  Prior Rehab/Hospitalizations Has the patient had prior rehab or hospitalizations prior to admission? Yes  Has the patient had major surgery during 100 days prior to admission? Yes   Current Medications  Current Facility-Administered Medications:  .  acetaminophen (TYLENOL) tablet 500 mg, 500 mg, Oral, Q4H PRN, Early, Arvilla Meres, MD, 500 mg at 04/20/20 0450 .  calcitRIOL (ROCALTROL) capsule 1 mcg, 1 mcg, Oral, Q M,W,F, Ulyses Amor, [place], 1 mcg at 04/23/20 6286 .  Chlorhexidine Gluconate Cloth 2 % PADS 6 each, 6 each, Topical, Q0600, Ulyses Amor, PA-C, 6 each at 04/23/20 [phone] .  Darbepoetin Alfa (ARANESP) injection 60 mcg, 60 mcg, Intravenous, Q Mon-HD, Ulyses Amor, PA-C, 60 mcg at 04/16/20 1034 .  ezetimibe (ZETIA) tablet 10 mg, 10 mg, Oral, Daily, Laurence Slate M, PA-C, 10 mg at 04/23/20 0919 .  lamoTRIgine (LAMICTAL) tablet 50 mg, 50 mg, Oral, BID, Laurence Slate M, PA-C, 50 mg at 04/23/20 0919 .  methocarbamol (ROBAXIN) tablet 500 mg, 500 mg, Oral, TID, Laurence Slate M, PA-C, 500 mg at 04/23/20 7116 .  metoprolol tartrate (LOPRESSOR) injection 5 mg, 5 mg,  Intravenous, Q6H PRN, Marty Heck, MD, 5 mg at 04/20/20 1523 .  metoprolol tartrate (LOPRESSOR) tablet 75 mg, 75 mg, Oral, BID, Ulyses Amor, PA-C, 75 mg at 04/22/20 2104 .  oxyCODONE-acetaminophen (PERCOCET/ROXICET) 5-325 MG per tablet 1-2 tablet, 1-2 tablet, Oral, Q4H PRN, Ulyses Amor, PA-C, 2 tablet at 04/23/20 [phone] .  pantoprazole (PROTONIX) EC tablet 40 mg, 40 mg, Oral, Daily, Laurence Slate M, PA-C, 40 mg at 04/23/20 0919 .  polyethylene glycol (MIRALAX / GLYCOLAX) packet 17 g, 17 g, Oral, Daily, Dagoberto Ligas, PA-C, 17 g at 04/23/20 0918 .  rosuvastatin (CRESTOR) tablet 40 mg, 40 mg, Oral, q1800, Ulyses Amor, PA-C, 40 mg at 04/22/20 1725 .  sevelamer carbonate (RENVELA) tablet 3,200 mg, 3,200 mg, Oral, TID WC, Collins, [name] M, PA-C, 3,200 mg at 04/23/20 8119 .  simethicone (MYLICON) chewable tablet 80 mg, 80 mg, Oral, QID PRN, Ulyses Amor, PA-C, 80 mg at 04/23/20 1478 .  traZODone (DESYREL) tablet 25 mg, 25 mg, Oral, QHS, Collins, [name] M, PA-C, 25 mg at 04/22/20 2102 .  Warfarin - Pharmacist Dosing Inpatient, , Does not apply, q1600, Marty Heck, MD  Patients Current Diet:  Diet Order            Diet renal with fluid restriction Fluid restriction: 1200 mL Fluid; Room service appropriate? Yes with Assist; Fluid consistency: Thin  Diet effective now                 Precautions / Restrictions Precautions Precautions: Fall Precaution Comments: R BKA Restrictions Weight Bearing Restrictions: Yes RLE Weight Bearing: Non weight bearing   Has the patient had 2 or more falls or a fall with injury in the past year? No  Prior Activity Level Community (5-7x/wk): Mod I with rollator and driving  Prior Functional Level Self Care: Did the patient need help bathing, dressing, using the toilet or eating? Independent  Indoor Mobility: Did the patient need assistance with walking from room to room (with or without device)? Independent  Stairs: Did the  patient need assistance with internal or external stairs (with or without device)? Independent  Functional Cognition: Did the patient need help planning regular tasks such as shopping or remembering to take medications? Independent  Home Assistive Devices / Equipment Home Assistive Devices/Equipment: None Home Equipment: Walker - 2 wheels,Walker - 4 wheels,Cane - single point  Prior Device Use: Indicate devices/aids used by the patient prior to current illness, exacerbation or injury? Walker  Current Functional Level Cognition  Overall Cognitive Status: Within Functional Limits for tasks assessed Orientation Level: Oriented X4 General Comments: Drowsy from pain medications, intermittently falling asleep    Extremity Assessment (includes Sensation/Coordination)  Upper Extremity Assessment: Generalized weakness  Lower Extremity Assessment: Defer to PT evaluation RLE Deficits / Details: s/p BKA. Grossly 2/5 strength LLE Deficits / Details: Strength grossly 4/5    ADLs  Overall ADL's : Needs assistance/impaired Grooming: Set up,Sitting Lower Body Dressing: Moderate assistance,Sit to/from stand Lower Body Dressing Details (indicate cue type and reason): Able to doff L sock and don L shoe with set-up assist. Would likely require Mod A to thread BLE and hike LB clothing over hips in standing with assist to maintain standing balance. Toilet Transfer: Moderate assistance,Stand-pivot Toilet Transfer Details (indicate cue type and reason): Simulated with stand-pivot transfer to recliner. General ADL Comments: Pt recently back to bed after sitting up in recliner and PT session but agreeable to OT session. completed bed mobility 3x sit<>stands EOB, pivotal steps to simunlate toilet transfer to [facility].    Mobility  Overal bed mobility: Needs Assistance Bed Mobility: Rolling (scooting up) Rolling: Supervision Supine to sit: Min guard Sit to supine: Min guard General bed mobility comments:  supervision for safety and needs of pt    Transfers  Overall transfer level: Needs assistance Equipment used: Rolling walker (2 wheeled) Transfers: Sit to/from [employer] Sit to Stand: Mod assist Stand pivot transfers: Mod assist General transfer comment: declines OOB after his HD    Ambulation / Gait / Stairs / Wheelchair Mobility  Ambulation/Gait Ambulation/Gait assistance: Mod  assist Gait Distance (Feet): 4 Feet (+ 10 ft) Assistive device: Rolling walker (2 wheeled) Gait Pattern/deviations: Step-to pattern,Trunk flexed,Antalgic General Gait Details: Cues for hip extension and upper trunk control this session.  Pt fatigues quickly with short bout of gt training in room.    Posture / Balance Dynamic Sitting Balance Sitting balance - Comments: Patient able to maintain sitting balance seated EOB while doffing/donning footwear. No overt LOB. Balance Overall balance assessment: Needs assistance Sitting-balance support: Bilateral upper extremity supported,Feet supported Sitting balance-Leahy Scale: Fair Sitting balance - Comments: Patient able to maintain sitting balance seated EOB while doffing/donning footwear. No overt LOB. Postural control: Posterior lean Standing balance support: Bilateral upper extremity supported,During functional activity Standing balance-Leahy Scale: Poor Standing balance comment: Heavy reliance on BUE on RW and occasional external assist.    Special needs/care consideration  Right BKA surgical site monitoring for possible need for AKA Denise, significant other, is visitor   Previous Home Environment  Living Arrangements: Spouse/significant other (girlfriend, Veterinary surgeon)  Lives With: Significant other Available Help at Discharge: Available PRN/intermittently Type of Home: [place]: One level,Able to live on main level with bedroom/bathroom Home Access: Stairs to enter [employer] of Steps: 1 Bathroom Shower/Tub: Clinical cytogeneticist: Standard Bathroom Accessibility: Yes How Accessible: Accessible via walker [place]: No  Discharge Living Setting Plans for Discharge Living Setting: Patient's home,Lives with (comment) (girlfriend, Denise) Type of Home at Discharge: House Discharge Home Layout: One level Discharge Home Access: Stairs to enter Entrance Stairs-Rails: None Entrance Stairs-Number of Steps: 1 Discharge Bathroom Shower/Tub: Horticulturist, commercial: Standard Discharge Bathroom Accessibility: Yes How Accessible: Accessible via walker Does the patient have any problems obtaining your medications?: No  Social/Family/Support Systems Contact Information: Langley Gauss, Significant other Anticipated Caregiver: Product manager Information: see above Caregiver Availability: Intermittent Discharge Plan Discussed with Primary Caregiver: Yes Is Caregiver In Agreement with Plan?: Yes Does Caregiver/Family have Issues with Lodging/Transportation while Pt is in Rehab?: No  Goals Patient/Family Goal for Rehab: Mod I with PT and OT Expected length of stay: ELOS 7 to 10 days Additional Information: Hemodialysis MWF Pt/Family Agrees to Admission and willing to participate: Yes Program Orientation Provided & Reviewed with Pt/Caregiver Including Roles  & Responsibilities: Yes  Decrease burden of Care through IP rehab admission: n/a  Possible need for SNF placement upon discharge: not anticipated  Patient Condition: I have reviewed medical records from [facility] , spoken with CM, and patient. I met with patient at the bedside for inpatient rehabilitation assessment.  Patient will benefit from ongoing PT and OT, can actively participate in 3 hours of therapy a day 5 days of the week, and can make measurable gains during the admission.  Patient will also benefit from the coordinated team approach during an Inpatient Acute Rehabilitation admission.   The patient will receive intensive therapy as well as Rehabilitation physician, nursing, social worker, and care management interventions.  Due to bladder management, bowel management, safety, skin/wound care, disease management, medication administration, pain management and patient education the patient requires 24 hour a day rehabilitation nursing.  The patient is currently min to min guard overall with mobility and basic ADLs.  Discharge setting and therapy post discharge at home with home health is anticipated.  Patient has agreed to participate in the Acute Inpatient Rehabilitation Program and will admit today.  Preadmission Screen Completed By:  Cleatrice Burke, 04/23/2020 10:46 AM ______________________________________________________________________   Discussed status with Dr. Naaman Plummer on  04/23/2020 at 1045 and received approval for admission today.  Admission Coordinator:  Cleatrice Burke, RN, time  0910 Date  04/23/2020   Assessment/Plan: Diagnosis: right bka 1. Does the need for close, 24 hr/day Medical supervision in concert with the patient's rehab needs make it unreasonable for this patient to be served in a less intensive setting? Yes 2. Co-Morbidities requiring supervision/potential complications: esrd on HD, renal cell ca, avr/afib,osa 3. Due to bladder management, bowel management, safety, skin/wound care, disease management, medication administration, pain management and patient education, does the patient require 24 hr/day rehab nursing? Yes 4. Does the patient require coordinated care of a physician, rehab nurse, PT, OT, and SLP to address physical and functional deficits in the context of the above medical diagnosis(es)? Yes Addressing deficits in the following areas: balance, endurance, locomotion, strength, transferring, bowel/bladder control, bathing, dressing, feeding, grooming, toileting and psychosocial support 5. Can the patient actively participate in an  intensive therapy program of at least 3 hrs of therapy 5 days a week? Yes 6. The potential for patient to make measurable gains while on inpatient rehab is excellent 7. Anticipated functional outcomes upon discharge from inpatient rehab: modified independent PT, modified independent OT, n/a SLP 8. Estimated rehab length of stay to reach the above functional goals is: 7-10 days 9. Anticipated discharge destination: Home 10. Overall Rehab/Functional Prognosis: excellent   MD Signature: Meredith Staggers, MD, [facility] 04/23/2020

## 2020-04-24 ENCOUNTER — Inpatient Hospital Stay (HOSPITAL_COMMUNITY): Payer: Medicare Other

## 2020-04-24 ENCOUNTER — Other Ambulatory Visit: Payer: Self-pay

## 2020-04-24 ENCOUNTER — Inpatient Hospital Stay (HOSPITAL_COMMUNITY): Payer: Medicare Other | Admitting: Occupational Therapy

## 2020-04-24 DIAGNOSIS — I4891 Unspecified atrial fibrillation: Secondary | ICD-10-CM

## 2020-04-24 DIAGNOSIS — S88112A Complete traumatic amputation at level between knee and ankle, left lower leg, initial encounter: Secondary | ICD-10-CM

## 2020-04-24 DIAGNOSIS — G4733 Obstructive sleep apnea (adult) (pediatric): Secondary | ICD-10-CM

## 2020-04-24 DIAGNOSIS — D631 Anemia in chronic kidney disease: Secondary | ICD-10-CM

## 2020-04-24 DIAGNOSIS — N186 End stage renal disease: Secondary | ICD-10-CM

## 2020-04-24 DIAGNOSIS — R791 Abnormal coagulation profile: Secondary | ICD-10-CM

## 2020-04-24 DIAGNOSIS — D7589 Other specified diseases of blood and blood-forming organs: Secondary | ICD-10-CM

## 2020-04-24 DIAGNOSIS — Z992 Dependence on renal dialysis: Secondary | ICD-10-CM

## 2020-04-24 DIAGNOSIS — D638 Anemia in other chronic diseases classified elsewhere: Secondary | ICD-10-CM

## 2020-04-24 LAB — CBC
HCT: 26.5 % — ABNORMAL LOW (ref 39.0–52.0)
Hemoglobin: 8.1 g/dL — ABNORMAL LOW (ref 13.0–17.0)
MCH: 33.6 pg (ref 26.0–34.0)
MCHC: 30.6 g/dL (ref 30.0–36.0)
MCV: 110 fL — ABNORMAL HIGH (ref 80.0–100.0)
Platelets: 152 K/uL (ref 150–400)
RBC: 2.41 MIL/uL — ABNORMAL LOW (ref 4.22–5.81)
RDW: 14.7 % (ref 11.5–15.5)
WBC: 5.5 K/uL (ref 4.0–10.5)
nRBC: 0 % (ref 0.0–0.2)

## 2020-04-24 LAB — RENAL FUNCTION PANEL
Albumin: 2.5 g/dL — ABNORMAL LOW (ref 3.5–5.0)
Anion gap: 17 — ABNORMAL HIGH (ref 5–15)
BUN: 59 mg/dL — ABNORMAL HIGH (ref 8–23)
CO2: 25 mmol/L (ref 22–32)
Calcium: 9.9 mg/dL (ref 8.9–10.3)
Chloride: 93 mmol/L — ABNORMAL LOW (ref 98–111)
Creatinine, Ser: 11.81 mg/dL — ABNORMAL HIGH (ref 0.61–1.24)
GFR, Estimated: 4 mL/min — ABNORMAL LOW (ref >60)
Glucose, Bld: 86 mg/dL (ref 70–99)
Phosphorus: 5.8 mg/dL — ABNORMAL HIGH (ref 2.5–4.6)
Potassium: 5 mmol/L (ref 3.5–5.1)
Sodium: 135 mmol/L (ref 135–145)

## 2020-04-24 LAB — PROTIME-INR
INR: 6.1 (ref 0.8–1.2)
Prothrombin Time: 52.6 seconds — ABNORMAL HIGH (ref 11.4–15.2)

## 2020-04-24 MED ORDER — DARBEPOETIN ALFA 100 MCG/0.5ML IJ SOSY
100.0000 ug | PREFILLED_SYRINGE | INTRAMUSCULAR | Status: DC
  Administered 2020-04-24: 100 ug via INTRAVENOUS

## 2020-04-24 MED ORDER — DARBEPOETIN ALFA 100 MCG/0.5ML IJ SOSY
PREFILLED_SYRINGE | INTRAMUSCULAR | Status: AC
  Filled 2020-04-24: qty 0.5

## 2020-04-24 MED ORDER — HEPARIN SODIUM (PORCINE) 1000 UNIT/ML DIALYSIS: 2000.0000 [IU] | INTRAMUSCULAR | Status: DC | PRN

## 2020-04-24 MED ORDER — ALLOPURINOL 100 MG PO TABS
100.0000 mg | ORAL_TABLET | Freq: Every day | ORAL | Status: DC
  Administered 2020-04-24 – 2020-05-02 (×9): 100 mg via ORAL
  Filled 2020-04-24 (×11): qty 1

## 2020-04-24 MED ORDER — HYDROXYCHLOROQUINE SULFATE 200 MG PO TABS
400.0000 mg | ORAL_TABLET | Freq: Every day | ORAL | Status: DC
  Administered 2020-04-24 – 2020-05-02 (×9): 400 mg via ORAL
  Filled 2020-04-24 (×11): qty 2

## 2020-04-24 MED ORDER — CHLORHEXIDINE GLUCONATE 0.12 % MT SOLN
15.0000 mL | Freq: Two times a day (BID) | OROMUCOSAL | Status: DC
  Administered 2020-04-24 – 2020-05-02 (×11): 15 mL via OROMUCOSAL
  Filled 2020-04-24 (×15): qty 15

## 2020-04-24 MED ORDER — ORAL CARE MOUTH RINSE
15.0000 mL | Freq: Two times a day (BID) | OROMUCOSAL | Status: DC
  Administered 2020-04-25 – 2020-05-02 (×9): 15 mL via OROMUCOSAL

## 2020-04-24 MED ORDER — PHYTONADIONE 1 MG/0.5 ML ORAL SOLUTION
1.0000 mg | Freq: Once | ORAL | Status: AC
  Administered 2020-04-24: 1 mg via ORAL
  Filled 2020-04-24: qty 0.5

## 2020-04-24 MED ORDER — CLOPIDOGREL BISULFATE 75 MG PO TABS
75.0000 mg | ORAL_TABLET | Freq: Every day | ORAL | Status: DC
  Administered 2020-04-24 – 2020-05-02 (×9): 75 mg via ORAL
  Filled 2020-04-24 (×9): qty 1

## 2020-04-24 MED ORDER — PREDNISONE 10 MG PO TABS
5.0000 mg | ORAL_TABLET | Freq: Every day | ORAL | Status: DC
  Administered 2020-04-24 – 2020-05-02 (×9): 5 mg via ORAL
  Filled 2020-04-24 (×9): qty 1

## 2020-04-24 NOTE — Evaluation (Signed)
Physical Therapy Assessment and Plan  Patient Details  Name: Jeremy [name] MRN: [id number] Date of Birth: December 02, 1955  PT Diagnosis: Abnormality of gait, Difficulty walking, Impaired sensation and Muscle weakness Rehab Potential: Good ELOS: 7-10 days   Today's Date: 04/24/2020 PT Individual Time: 0800-0900 + 1000-1100 PT Individual Time Calculation (min): 60 min  + 68mn  Hospital Problem: Active Problems:   Below-knee amputation of left lower extremity (HCC)   Anemia of chronic disease   Macrocytosis   ESRD on dialysis ([place]   Supratherapeutic INR   Past Medical History:  Past Medical History:  Diagnosis Date  . Abnormal stress test 03/19/2018  . 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 Aortic stenosis    a. 2014 s/p mech AVR ([facility])-->chronic coumadin; b. 08/2018 Echo: Mild AS/AI. Nl fxn/gradient.  . Arthritis   . Atrial fibrillation ([place]   . CAD (coronary artery disease)    a. 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 [facility], inflat infarct w/ min rev, EF 30%; c. 06/2018 Cath: LM min irregs, LAD 60p, 95/30/68m, LCX min irregs, RCA 40p, 7088m0d, RPAV 90, RPDA 95ost; d. 09/2018 CABGx3 (LIMA->LAD, VG->D1, VG->PDA).  . Chronic anticoagulation 06/2012   a. Coumadin in the setting of mech AVR and afib.  . CMarland KitchenVID-19 03/15/2019  . CPAP (continuous positive airway pressure) dependence   [facility]   Patient started HD around 1996,  had 1st transplant LLQ around 1998 until 2007 when they found renal cell cancer in transplant and both native kidneys, all were removed > went back on HD until 2nd transplant RLQ in 2014 which lasted 3 yrs; it was embolized for suspected acute rejection. [facility]  . ESRD (end stage renal disease) ([place]  a. 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 1999 s/p Kidney transplant; c. 2014 Bilat  nephrectomy (transplanted kidney resected) in the setting of renal cell carcinoma; d. PD until 11/2017-->HD.  . HMarland Kitchenart murmur   . HFrEF (heart failure with reduced ejection fraction) ([place]  a. 07/2016 Echo: EF 55-60%; b. 02/2018 MV: EF 30%; c. 04/2018 Echo: EF 35%; d. 08/2018 Echo: EF 30-35%. Glob HK w/o rwma. Mildly reduced RV fxn. Mod to sev dil LA. Nl AoV fxn.  . HMarland Kitchen of colonoscopy    2009 by Dr. RehLaural Goldenormal except for small submucosal lipoma. No evidence of polyps or colitis.  . Hyperlipidemia   . Hypertension   . Hypogonadism in male 07/25/2015  . Ischemic cardiomyopathy    a. 07/2016 Echo: EF 55-60%; b. 02/2018 MV: EF 30%; c. 04/2018 Echo: EF 35%; d. 08/2018 Echo: EF 30-35%.  . OSA (obstructive sleep apnea)    No CPAP  . Permanent atrial fibrillation (HCC)    a. CHA2DS2VASc = 3-->Coumadin (also mech AVR); b. 06/2018 Amio started 2/2 elevated rates.  . Renal cell cancer ([place] . Renal cell carcinoma ([place]/30/2018   Past Surgical History:  Past Surgical History:  Procedure Laterality Date  . ABDOMINAL AORTOGRAM W/LOWER EXTREMITY N/A 12/22/2019   Procedure: ABDOMINAL AORTOGRAM W/LOWER EXTREMITY;  Surgeon: ClaMarty HeckD;  [facility];  Service: Cardiovascular;  Laterality: N/A;  . AMPUTATION Right 04/14/2020   Procedure: RIGHT BELOW KNEE AMPUTATION;  Surgeon: ClaMarty HeckD;  [facility];  Laterality: Right;  . AORTIC VALVE REPLACEMENT  3 /11 /2014   At [place]    . CAPD INSERTION N/A 02/19/2017   Procedure: LAPAROSCOPIC INSERTION CONTINUOUS AMBULATORY PERITONEAL DIALYSIS  (CAPD) CATHETER;  Surgeon: Algernon Huxley, MD;  Location: [facility] ORS;  Service: General;  Laterality: N/A;  . CORONARY ANGIOPLASTY WITH STENT PLACEMENT    . CORONARY ARTERY BYPASS GRAFT N/A 09/16/2018   Procedure: CORONARY ARTERY BYPASS GRAFT times three      with left internal mammary artery and endoharvest of right  leg greater saphenous vein;  Surgeon: Melrose Nakayama, MD;  Location: [place];  Service: Open Heart Surgery;  Laterality: N/A;  . CORONARY STENT INTERVENTION N/A 07/15/2019   Procedure: CORONARY STENT INTERVENTION;  Surgeon: Wellington Hampshire, MD;  Location: [place] CV LAB;  Service: Cardiovascular;  Laterality: N/A;  . DG AV DIALYSIS  SHUNT ACCESS EXIST*L* OR     left arm  . EPIGASTRIC HERNIA REPAIR N/A 02/19/2017   Procedure: HERNIA REPAIR EPIGASTRIC ADULT;  Surgeon: Robert Bellow, MD;  Location: [facility];  Service: General;  Laterality: N/A;  . EYE SURGERY     bilateral cataract  . HERNIA REPAIR     UMBILICAL HERNIA  . HERNIA REPAIR  02/19/2017  . INNER EAR SURGERY     20 years ago  . KIDNEY TRANSPLANT    . LEFT HEART CATH AND CORS/GRAFTS ANGIOGRAPHY N/A 07/15/2019   Procedure: LEFT HEART CATH AND CORS/GRAFTS ANGIOGRAPHY;  Surgeon: Wellington Hampshire, MD;  Location: [place] CV LAB;  Service: Cardiovascular;  Laterality: N/A;  . LOWER EXTREMITY ANGIOGRAPHY Right 08/25/2019   Procedure: LOWER EXTREMITY ANGIOGRAPHY;  Surgeon: Algernon Huxley, MD;  Location: [place] CV LAB;  Service: Cardiovascular;  Laterality: Right;  . LOWER EXTREMITY ANGIOGRAPHY Left 09/29/2019   Procedure: Lower Extremity Angiography;  Surgeon: Algernon Huxley, MD;  Location: [place] CV LAB;  Service: Cardiovascular;  Laterality: Left;  . PERIPHERAL VASCULAR INTERVENTION Right 12/22/2019   Procedure: PERIPHERAL VASCULAR INTERVENTION;  Surgeon: Marty Heck, MD;  Location: [place] [facility] LAB;  Service: Cardiovascular;  Laterality: Right;  . Peritoneal Dialysis access placement  02/19/2017  . PSEUDOANERYSM COMPRESSION N/A 09/29/2019   Procedure: PSEUDOANERYSM COMPRESSION;  Surgeon: Algernon Huxley, MD;  Location: [place] CV LAB;  Service: Cardiovascular;  Laterality: N/A;  . REMOVAL OF A DIALYSIS CATHETER N/A 11/23/2017   Procedure: REMOVAL OF A PERITONEAL DIALYSIS CATHETER;  Surgeon: Algernon Huxley, MD;  Location: [facility];  Service: Vascular;  Laterality: N/A;  . RIGHT/LEFT HEART CATH AND CORONARY ANGIOGRAPHY Bilateral 06/07/2018   Procedure: RIGHT/LEFT HEART CATH AND CORONARY ANGIOGRAPHY;  Surgeon: Wellington Hampshire, MD;  Location: [place] CV LAB;  Service: Cardiovascular;  Laterality: Bilateral;  . TEE WITHOUT CARDIOVERSION N/A 07/21/2016   Procedure: TRANSESOPHAGEAL ECHOCARDIOGRAM (TEE);  Surgeon: Wellington Hampshire, MD;  Location: [facility];  Service: Cardiovascular;  Laterality: N/A;  . TEE WITHOUT CARDIOVERSION N/A 09/16/2018   Procedure: TRANSESOPHAGEAL ECHOCARDIOGRAM (TEE);  Surgeon: Melrose Nakayama, MD;  Location: [place];  Service: Open Heart Surgery;  Laterality: N/A;    Assessment & Plan Clinical Impression: Patient is a 65 year old male with history of renal cell CA s/p failed transplant--HD-MWF , mechanical AVR/A fib-on coumadin, OSA, PVDwith multiple stents LLE andright foot ulcer s/p angio and stenting in attempts at limb salvage. He was admitted on 04/12/20 with critical limb ischemia with right foot  gangrene and BKA recommended. 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 Carlis Abbott. Post op course significant for A fib with RVR requiring IV BB, poor pain control as well as fevers on 01/11-->. BC X 2 negative. CXR showed question of LLL cavitary mass but CT chest negative. Coumadin being managed with pharmacy but INR has been labile --received vitamin K for INR up to 4.7--INR back up to 5.4 today. Duskiness around R-BKA stump being monitored due to concerns of needing revision but felt to be due to anticoagulation. Patient continues to be limited by R-BKA, BUE weakness as well as decreased activity tolerance. CIR recommended due to functional decline. Patient transferred to CIR on 04/23/2020 .   Patient currently requires min/ mod with mobility secondary to muscle weakness, decreased cardiorespiratoy endurance and decreased standing balance and decreased  balance strategies.  Prior to hospitalization, patient was modified independent  with mobility and lived with Significant other,Other (Comment) (GF) in a House home.  Home access is 2Stairs to enter.  Patient will benefit from skilled PT intervention to maximize safe functional mobility, minimize fall risk and decrease caregiver burden for planned discharge home with intermittent assist.  Anticipate patient will benefit from follow up [facility] at discharge.  PT - End of Session Activity Tolerance: Tolerates 30+ min activity with multiple rests Endurance Deficit: Yes Endurance Deficit Description: Benefits from seated rest breaks during functional mobility tasks PT Assessment Rehab Potential (ACUTE/IP ONLY): Good PT Barriers to Discharge: [place] home environment;Decreased caregiver support;Insurance for SNF coverage;Weight bearing restrictions;Hemodialysis PT Patient demonstrates impairments in the following area(s): Balance;Endurance;Motor;Nutrition;Pain;Safety;Sensory;Skin Integrity PT Transfers Functional Problem(s): Bed Mobility;Bed to Chair;Car PT Locomotion Functional Problem(s): Ambulation;Wheelchair Mobility;Stairs PT Plan PT Intensity: Minimum of 1-2 x/day ,45 to 90 minutes PT Frequency: 5 out of 7 days PT Duration Estimated Length of Stay: 7-10 days PT Treatment/Interventions: Ambulation/gait training;Discharge planning;Functional mobility training;Psychosocial support;Therapeutic Activities;Visual/perceptual remediation/compensation;Wheelchair propulsion/positioning;Therapeutic Exercise;Skin care/wound management;Neuromuscular re-education;Disease management/prevention;Balance/vestibular training;DME/adaptive equipment instruction;Pain management;Splinting/orthotics;UE/LE Strength taining/ROM;Cognitive remediation/compensation;Community reintegration;Functional electrical stimulation;Patient/family education;Stair training;UE/LE Coordination activities PT Transfers Anticipated  Outcome(s): mod I PT Locomotion Anticipated Outcome(s): CGA 39f with LRAD PT Recommendation Recommendations for Other Services: Neuropsych consult Follow Up Recommendations: Home health PT Patient destination: Home Equipment Recommended: To be determined Equipment Details: Pt owns rollator   PT Evaluation Precautions/Restrictions Precautions Precautions: Fall Precaution Comments: R BKA Restrictions Weight Bearing Restrictions: Yes RLE Weight Bearing: Non weight bearing General Chart Reviewed: Yes Additional Pertinent History: renal cell CA s/p failed transplant--HD-MWF , mechanical AVR/A fib-on coumadin, OSA, PVD with multiple stents LLE and right foot ulcer s/p angio and stenting in attempts at limb salvage. Family/Caregiver Present: No Vital SignsTherapy Vitals Temp: 98.3 F (36.8 C) Temp Source: Oral Pulse Rate: (!) 105 Resp: 19 BP: 120/78 Patient Position (if appropriate): Sitting Oxygen Therapy SpO2: 94 % O2 Device: Room Air Pain Pain Assessment Pain Scale: 0-10 Pain Score: 6  Pain Type: Acute pain;Surgical pain Pain Location: Leg Pain Orientation: Right Pain Descriptors / Indicators: Aching;Burning Pain Frequency: Intermittent Pain Onset: With Activity Patients Stated Pain Goal: 4 Pain Intervention(s): Medication (See eMAR) (percocet given) Multiple Pain Sites: No Home Living/Prior Functioning Home Living Living Arrangements: Spouse/significant other Available Help at Discharge: Available PRN/intermittently;Other (Comment) (GF works remotely from home) Type of Home: House Home Access: Stairs to enter ETechnical brewerof Steps: 2 Entrance Stairs-Rails: None Home Layout: Able to live on main level with bedroom/bathroom;Two level;Laundry or work area in basement Alternate LLimited [place] Steps: Full flight of steps to basement Bathroom  Shower/Tub: Multimedia programmer: Standard  Lives With: Significant other;Other (Comment)  (GF) Prior Function Level of Independence: Independent with basic ADLs;Independent with transfers;Requires assistive device for independence  Able to Take Stairs?: Yes (would take him awhile due to fatigue) Driving: Yes (GF does most of the driving) Vocation: On disability Vocation Requirements: On disability since 2017 Vision/Perception  Perception Perception: Within Functional Limits Praxis Praxis: Intact  Cognition Overall Cognitive Status: Within Functional Limits for tasks assessed Arousal/Alertness: Awake/alert Orientation Level: Oriented X4 Attention: Selective;Sustained Sustained Attention: Appears intact Selective Attention: Appears intact Memory: Appears intact Problem Solving: Impaired Problem Solving Impairment: Functional complex;Functional basic Safety/Judgment: Appears intact Sensation Sensation Light Touch: Impaired by gross assessment Light Touch Impaired Details: Impaired LLE;Impaired RLE (Contributes LLE numbness to his hematoma in his groin) Hot/Cold: Appears Intact Proprioception: Impaired Detail Proprioception Impaired Details: Impaired LLE Stereognosis: Not tested Additional Comments: Great toe on L impaired Coordination Gross Motor Movements are Fluid and Coordinated: No Fine Motor Movements are Fluid and Coordinated: No Coordination and Movement Description: Effortful due to debility and general deconditioning as well as s/p R BKA impacting movement patterns Heel Shin Test: UTA RLE 2/2 BKA Motor  Motor Motor: Other (comment) Motor - Skilled Clinical Observations: R BKA with balance strategies deficits   Trunk/Postural Assessment  Cervical Assessment Cervical Assessment: Within Functional Limits Thoracic Assessment Thoracic Assessment: Within Functional Limits Lumbar Assessment Lumbar Assessment: Within Functional Limits Postural Control Postural Control: Within Functional Limits  Balance Balance Balance Assessed: Yes Static Sitting  Balance Static Sitting - Balance Support: No upper extremity supported;Feet supported Static Sitting - Level of Assistance: 5: Stand by assistance Dynamic Sitting Balance Dynamic Sitting - Balance Support: During functional activity Dynamic Sitting - Level of Assistance: 5: Stand by assistance Static Standing Balance Static Standing - Balance Support: Bilateral upper extremity supported Static Standing - Level of Assistance: 4: Min assist Dynamic Standing Balance Dynamic Standing - Balance Support: Bilateral upper extremity supported;During functional activity Dynamic Standing - Level of Assistance: 4: Min assist Extremity Assessment  RUE Assessment RUE Assessment: Within Functional Limits LUE Assessment LUE Assessment: Within Functional Limits RLE Assessment RLE Assessment: Exceptions to [facility] General Strength Comments: R BKA LLE Assessment LLE Assessment: Within Functional Limits General Strength Comments: Grossly 4+/5  Care Tool Care Tool Bed Mobility Roll left and right activity   Roll left and right assist level: Supervision/Verbal cueing    Sit to lying activity   Sit to lying assist level: Supervision/Verbal cueing    Lying to sitting edge of bed activity   Lying to sitting edge of bed assist level: Supervision/Verbal cueing     Care Tool Transfers Sit to stand transfer   Sit to stand assist level: Minimal Assistance - Patient > 75%    Chair/bed transfer   Chair/bed transfer assist level: Moderate Assistance - Patient 50 - 74%     Psychologist, counselling transfer activity did not occur: Safety/medical concerns        Care Tool Locomotion Ambulation Ambulation activity did not occur: Safety/medical concerns        Walk 10 feet activity Walk 10 feet activity did not occur: Safety/medical concerns       Walk 50 feet with 2 turns activity Walk 50 feet with 2 turns activity did not occur: Safety/medical concerns      Walk 150 feet activity  Walk 150 feet activity did not occur: Safety/medical concerns      Walk 10  feet on uneven surfaces activity Walk 10 feet on uneven surfaces activity did not occur: Safety/medical concerns      Stairs Stair activity did not occur: Safety/medical concerns        Walk up/down 1 step activity Walk up/down 1 step or curb (drop down) activity did not occur: Safety/medical concerns     Walk up/down 4 steps activity did not occuR: Safety/medical concerns  Walk up/down 4 steps activity      Walk up/down 12 steps activity Walk up/down 12 steps activity did not occur: Safety/medical concerns      Pick up small objects from floor Pick up small object from the floor (from standing position) activity did not occur: Safety/medical concerns      Wheelchair Will patient use wheelchair at discharge?: Yes Type of Wheelchair: Manual   Wheelchair assist level: Supervision/Verbal cueing Max wheelchair distance: 200FT  Wheel 50 feet with 2 turns activity   Assist Level: Supervision/Verbal cueing  Wheel 150 feet activity   Assist Level: Supervision/Verbal cueing    Refer to Care Plan for Long Term Goals  SHORT TERM GOAL WEEK 1 PT Short Term Goal 1 (Week 1): STG = LTG due to ELOS  Recommendations for other services: Neuropsych  Skilled Therapeutic Intervention Mobility Bed Mobility Bed Mobility: Rolling Right;Rolling Left;Supine to Sit;Sit to Supine Rolling Right: Supervision/verbal cueing Rolling Left: Supervision/Verbal cueing Supine to Sit: Supervision/Verbal cueing Sit to Supine: Supervision/Verbal cueing Transfers Transfers: Sit to Stand;Stand to Sit;Stand Pivot Transfers;Squat Pivot Transfers Sit to Stand: Moderate Assistance - Patient 50-74% Stand to Sit: Moderate Assistance - Patient 50-74% Stand Pivot Transfers: Moderate Assistance - Patient 50 - 74% Stand Pivot Transfer Details: Tactile cues for sequencing;Verbal cues for safe use of DME/AE;Verbal cues for gait pattern;Verbal  cues for technique;Verbal cues for precautions/safety;Visual cues/gestures for precautions/safety;Verbal cues for sequencing;Visual cues/gestures for sequencing;Visual cues for safe use of DME/AE Squat Pivot Transfers: Minimal Assistance - Patient > 75% Transfer (Assistive device): Rolling walker Locomotion  Gait Ambulation: No Gait Gait: No Stairs / Additional Locomotion Stairs: No Wheelchair Mobility Wheelchair Mobility: Yes Wheelchair Assistance: Chartered loss adjuster: Both upper extremities Wheelchair Parts Management: Needs assistance Distance: 283f  Skilled Intervention:  1st session: Pt received supine in bed, awake and agreeable to therapy. No reports of pain and RN present for morning medications.   Instructed pt in results of PT evaluation as detailed above, PT POC, rehab potential, rehab goals, and discharge recommendations. Additionally discussed CIR's policies regarding fall safety and use of chair alarm and/or quick release belt. Pt verbalized understanding and in agreement. Will update pt's family members as they become available.   Performed rolling L<>R in bed with supervision without bed features. Able to perform supine<>sit with supervision with HOB flat. Sit's EOB with SBA with good static sitting and dynamic sitting balance noted. Required minA for donning non-skid hospital sock to his L foot. He was able to don pants with minA; completed this by pulling pants over legs in short sitting EOB and then returned to supine to pull over his hips via bridging. He was able to don t-shirt with setupA at EOB. Sit<>stand with modA from EOB to RW, difficult with producing power to rise. Stand<>pivot with min/modA and RW from EOB to w/c, towards L side. Pt propelled himself >2022fduring session on level surfaces in his w/c, using bilateral upper extremities. Good stroke propulsion and efficiency noted. Retrieved w/c cushion, chair alarm, and RLE elevating  leg rest for residual limb support. Also wrapped  towel around R ELR to promote R knee extension while seated in w/c. Pt remained seated in w/c at end of session, made comfortable, needs within reach.  2nd session: Pt received sitting upright in w/c, agreeable to therapy. No reports of pain. Per RN, MD is requesting limited activity due to high INR values. Therefore, session focused on BKA education, skin care, phantom pain and desensitization techniques, benefits of therapy services, home safety training/preperation, and w/c management. Lengthy discussion regarding these topics. Also provided hands on training for w/c parts such as brakes, leg and arm rests, and how remove these for functional transfers. Also asked him to have girl friend take general home measurements such as door widths, bed heights, etc. Pt reports one of his main goals is to be able to perform transfers from his w/c to a dialysis chair mod I. He performed squat<>pivot transfer with minA at end of session, from w/c to EOB. Able to perform sit>supine with supervision without bed features. Pt remained supine in bed at end of session, hand off of care to OT, needs within reach.  Discharge Criteria: Patient will be discharged from PT if patient refuses treatment 3 consecutive times without medical reason, if treatment goals not met, if there is a change in medical status, if patient makes no progress towards goals or if patient is discharged from hospital.  The above assessment, treatment plan, treatment alternatives and goals were discussed and mutually agreed upon: by patient  Alger Simons PT, DPT 04/24/2020, 10:57 AM

## 2020-04-24 NOTE — Progress Notes (Signed)
Inpatient Rehabilitation Medication Review by a Pharmacist  A complete drug regimen review was completed for this patient to identify any potential clinically significant medication issues.  Clinically significant medication issues were identified:  yes   Type of Medication Issue Identified Description of Issue Urgent (address now) Non-Urgent (address on AM team rounds) Plan   Drug Interaction(s) (clinically significant)       Duplicate Therapy       Allergy       No Medication Administration End Date       Incorrect Dose  Rosuvastatin should be renally adjusted to 10mg  to avoid risk of myopathy. Patient also has stated allergy to statins however, has been on this dose for at least 6 months without apparent issues Non-rrgent Monitor for signs of myopathy, if occurs, consider switching to atorvastatin 80mg , which allows for high-intensity statin for PAD without requiring renal adjustment   Additional Drug Therapy Needed  Home meds not restarted: allopurinol 300mg , cinacalcet 30mg , clopidogrel 75mg , hydroxychloroquine 400mg  QHS, loperamide PRN, midodrine 10mg  on MWF, Renal-vitamin, prednisone 5mg  daily  -Darbepoetin not resumed     Non-urgent Ask nephrology to consider restarting darbepoetin, renal vitamin, cinacalcet and midodrine 10mg  on MWF  Ask CIR to consider restarting allopurinol at a lower dose of 100mg , clopidogrel 75mg , hydroxychloroquine 400mg  QHS, loperamide PRN, prednisone 5mg  daily    Other  -Aluminum hydroxide PRN indigestion may accumulate rapidly in ESRD patients Non-urgent Consider stopping aluminum hydroxide and give PRN Tums instead       For non-urgent medication issues to be resolved on team rounds tomorrow morning a [facility] Secure Chat Handoff was sent to: Algis Liming, PA and  Nephrology, MD (pending)    Time spent performing this drug regimen review (minutes):  [place], PharmD, [place], [facility] [place] Clinical Pharmacist  Please check AMION for all [place] phone  numbers After 10:00 PM, call [place]

## 2020-04-24 NOTE — Progress Notes (Signed)
Nephrology Follow-Up Consult Note   Assessment/Recommendations: Jeremy [name] is a/an 65 y.o. male with a past medical history significant for ESRD on HD admitted for RLE amputation.   # ESRD: MWF schedule, outpatient unit: [facility]. 3.75hrs, 2K, 2.25Ca, Na 137, Bicarbonate 35, Dialyzer: F180.  - Tentative THS now in CIR and to optimize HD needs for all patients, get back on MWF prior to DC - HD today 2/2, 3h, 2L UF  # Volume/ hypertension: trend post weights with HD  # Anemia of Chronic Kidney Disease: Currently receiving Epo 8000 units 3x weekly as an outpatient. Aranesp 39mcg 1/10, qmonday - increased dose to 100 mcg due today  # Secondary Hyperparathyroidism/Hyperphosphatemia: Home calcitriol 5mcg MWF  # Vascular access: L FA AVF with no issues  # RLE ischemia. 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  Pain control and dressing changes per primary.   # Mechanical aortic valve: on warfarin outpt, heparin bridge here, mgmt per primary   # Fevers, resolved: mgmt per primary service. note bcx drawn given h/o mechanical aortic valve-neg. Possible cavitary appearing structure on cxr, ct chest does not reveal a cavitary lesion.  _____________________________________________________  CC: ESRD  Interval History/Subjective:  No c/o NOw in CIR For HD today, postponed yesterday   Medications:  Current Facility-Administered Medications  Medication Dose Route Frequency Provider Last Rate Last Admin  . (feeding supplement) PROSource Plus liquid 30 mL  30 mL Oral BID BM Love, Pamela S, PA-C   30 mL at 04/24/20 0953  . acetaminophen (TYLENOL) tablet 500 mg  500 mg Oral Q4H PRN Love, Pamela S, PA-C      . allopurinol (ZYLOPRIM) tablet 100 mg  100 mg Oral Daily Bary Leriche, PA-C   100 mg at 04/24/20 0953  . aluminum hydroxide (AMPHOJEL/ALTERNAGEL) suspension 10 mL  10 mL Oral Q6H PRN Love, Pamela S, PA-C      . bisacodyl (DULCOLAX) suppository 10 mg  10 mg Rectal Daily PRN Love, Pamela S, PA-C       . [START ON 04/25/2020] calcitRIOL (ROCALTROL) capsule 1 mcg  1 mcg Oral Q M,W,F Love, Pamela S, PA-C      . chlorhexidine (PERIDEX) 0.12 % solution 15 mL  15 mL Mouth Rinse BID Jamse Arn, MD   15 mL at 04/24/20 0752  . clopidogrel (PLAVIX) tablet 75 mg  75 mg Oral Daily Bary Leriche, PA-C   75 mg at 04/24/20 1237  . diphenhydrAMINE (BENADRYL) 12.5 MG/5ML elixir 12.5-25 mg  12.5-25 mg Oral Q6H PRN Love, Pamela S, PA-C      . ezetimibe (ZETIA) tablet 10 mg  10 mg Oral Daily Bary Leriche, PA-C   10 mg at 04/24/20 0753  . guaiFENesin-dextromethorphan (ROBITUSSIN DM) 100-10 MG/5ML syrup 5-10 mL  5-10 mL Oral Q6H PRN Love, Pamela S, PA-C      . heparin injection 2,000 Units  2,000 Units Dialysis PRN Roney Jaffe, MD      . hydroxychloroquine (PLAQUENIL) tablet 400 mg  400 mg Oral QHS Love, Pamela S, PA-C      . lamoTRIgine (LAMICTAL) tablet 50 mg  50 mg Oral BID Love, Pamela S, PA-C   50 mg at 04/24/20 0754  . MEDLINE mouth rinse  15 mL Mouth Rinse q12n4p Jamse Arn, MD      . methocarbamol (ROBAXIN) tablet 500 mg  500 mg Oral TID Love, Pamela S, PA-C   500 mg at 04/24/20 8466  . metoprolol tartrate (LOPRESSOR) tablet 75  mg  75 mg Oral BID Bary Leriche, PA-C   75 mg at 04/24/20 0801  . milk and molasses enema  1 enema Rectal Daily PRN Love, Pamela S, PA-C      . oxyCODONE-acetaminophen (PERCOCET/ROXICET) 5-325 MG per tablet 1-2 tablet  1-2 tablet Oral Q4H PRN Bary Leriche, PA-C   2 tablet at 04/24/20 1237  . pantoprazole (PROTONIX) EC tablet 40 mg  40 mg Oral Daily Bary Leriche, PA-C   40 mg at 04/24/20 0754  . polyethylene glycol (MIRALAX / GLYCOLAX) packet 17 g  17 g Oral Daily Bary Leriche, PA-C   17 g at 04/24/20 0754  . polyethylene glycol (MIRALAX / GLYCOLAX) packet 17 g  17 g Oral Daily PRN Love, Pamela S, PA-C      . predniSONE (DELTASONE) tablet 5 mg  5 mg Oral Daily Love, Ivan Anchors, PA-C   5 mg at 04/24/20 0953  . prochlorperazine (COMPAZINE) tablet 5-10 mg  5-10  mg Oral Q6H PRN Love, Pamela S, PA-C       Or  . prochlorperazine (COMPAZINE) injection 5-10 mg  5-10 mg Intramuscular Q6H PRN Love, Pamela S, PA-C       Or  . prochlorperazine (COMPAZINE) suppository 12.5 mg  12.5 mg Rectal Q6H PRN Love, Pamela S, PA-C      . rosuvastatin (CRESTOR) tablet 40 mg  40 mg Oral q1800 Love, Pamela S, PA-C      . sevelamer carbonate (RENVELA) tablet 3,200 mg  3,200 mg Oral TID WC Love, Ivan Anchors, PA-C   3,200 mg at 04/24/20 1237  . simethicone (MYLICON) chewable tablet 80 mg  80 mg Oral QID PRN Love, Pamela S, PA-C      . traZODone (DESYREL) tablet 25 mg  25 mg Oral QHS Love, Pamela S, PA-C      . Warfarin - Pharmacist Dosing Inpatient   Does not apply [id number] Flora Lipps         Physical Exam: Vitals:   04/24/20 1005 04/24/20 1157  BP: 120/78 126/77  Pulse: (!) 105 (!) 111  Resp: 19 18  Temp: 98.3 F (36.8 C) 98.5 F (36.9 C)  SpO2: 94% 100%   Total I/O In: 50 [P.O.:50] Out: -   Intake/Output Summary (Last 24 hours) at 04/24/2020 1320 Last data filed at 04/24/2020 0715 Gross per 24 hour  Intake 50 ml  Output --  Net 50 ml   Constitutional: well-appearing, no acute distress CV: S1S2 no rub Respiratory: clear and unlabored Gastrointestinal: soft, non-tender, nondistended Ext: RLE absent below the knee, lue avf +b/t neuro: alert and oriented x3 conversant and follows commands Psych normal mood and affect   Test Results I personally reviewed new and old clinical labs and radiology tests Lab Results  Component Value Date   NA 135 04/24/2020   K 5.0 04/24/2020   CL 93 (L) 04/24/2020   CO2 25 04/24/2020   BUN 59 (H) 04/24/2020   CREATININE 11.81 (H) 04/24/2020   GLU 98 04/14/2019   CALCIUM 9.9 04/24/2020   ALBUMIN 2.5 (L) 04/24/2020   PHOS 5.8 (H) 04/24/2020    Rexene Agent, MD 04/24/2020  1:20 PM

## 2020-04-24 NOTE — Progress Notes (Signed)
Inpatient Rehabilitation Care Coordinator Assessment and Plan Patient Details  Name: Jeremy [name] MRN: [id number] Date of Birth: 03-28-1956  Today's Date: 04/24/2020  Hospital Problems: Active Problems:   Below-knee amputation of left lower extremity (HCC)   Anemia of chronic disease   Macrocytosis   ESRD on dialysis ([place])   Supratherapeutic INR  Past Medical History:  Past Medical History:  Diagnosis Date  . Abnormal stress test 03/19/2018  . 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 Aortic stenosis    a. 2014 s/p mech AVR ([facility])-->chronic coumadin; b. 08/2018 Echo: Mild AS/AI. Nl fxn/gradient.  . Arthritis   . Atrial fibrillation ([place])   . CAD (coronary artery disease)    a. 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 02/2018 MV [facility]): large inf, infsept, inflat infarct w/ min rev, EF 30%; c. 06/2018 Cath: LM min irregs, LAD 60p, 95/30/76m, LCX min irregs, RCA 40p, 73m, 90d, RPAV 90, RPDA 95ost; d. 09/2018 CABGx3 (LIMA->LAD, VG->D1, VG->PDA).  . Chronic anticoagulation 06/2012   a. Coumadin in the setting of mech AVR and afib.  Marland Kitchen COVID-19 03/15/2019  . CPAP (continuous positive airway pressure) dependence   . Dialysis patient [facility]) 04/07/2006   Patient started HD around 1996,  had 1st transplant LLQ around 1998 until 2007 when they found renal cell cancer in transplant and both native kidneys, all were removed > went back on HD until 2nd transplant RLQ in 2014 which lasted 3 yrs; it was embolized for suspected acute rejection. Is currently on HD MWF in [place] w/ [facility] nephrology.  . ESRD (end stage renal disease) ([facility])    a. 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 1999 s/p Kidney transplant; c. 2014 Bilat nephrectomy (transplanted kidney resected) in the setting of renal cell carcinoma; d. PD until 11/2017-->HD.  Marland Kitchen Heart murmur   . HFrEF (heart failure with reduced ejection fraction) ([place])    a. 07/2016 Echo: EF  55-60%; b. 02/2018 MV: EF 30%; c. 04/2018 Echo: EF 35%; d. 08/2018 Echo: EF 30-35%. Glob HK w/o rwma. Mildly reduced RV fxn. Mod to sev dil LA. Nl AoV fxn.  Marland Kitchen Hx of colonoscopy    2009 by Dr. Laural Golden. Normal except for small submucosal lipoma. No evidence of polyps or colitis.  . Hyperlipidemia   . Hypertension   . Hypogonadism in male 07/25/2015  . Ischemic cardiomyopathy    a. 07/2016 Echo: EF 55-60%; b. 02/2018 MV: EF 30%; c. 04/2018 Echo: EF 35%; d. 08/2018 Echo: EF 30-35%.  . OSA (obstructive sleep apnea)    No CPAP  . Permanent atrial fibrillation (HCC)    a. CHA2DS2VASc = 3-->Coumadin (also mech AVR); b. 06/2018 Amio started 2/2 elevated rates.  . Renal cell cancer ([place])   . Renal cell carcinoma ([place]) 08/04/2016   Past Surgical History:  Past Surgical History:  Procedure Laterality Date  . ABDOMINAL AORTOGRAM W/LOWER EXTREMITY N/A 12/22/2019   Procedure: ABDOMINAL AORTOGRAM W/LOWER EXTREMITY;  Surgeon: Marty Heck, MD;  Location: [facility] CV LAB;  Service: Cardiovascular;  Laterality: N/A;  . AMPUTATION Right 04/14/2020   Procedure: RIGHT BELOW KNEE AMPUTATION;  Surgeon: Marty Heck, MD;  Location: [facility];  Service: Vascular;  Laterality: Right;  . AORTIC VALVE REPLACEMENT  3 /11 /2014   At [facility]    . CAPD INSERTION N/A 02/19/2017   Procedure: LAPAROSCOPIC  INSERTION CONTINUOUS AMBULATORY PERITONEAL DIALYSIS  (CAPD) CATHETER;  Surgeon: Algernon Huxley, MD;  Location: [facility] ORS;  Service: General;  Laterality: N/A;  . CORONARY ANGIOPLASTY WITH STENT PLACEMENT    . CORONARY ARTERY BYPASS GRAFT N/A 09/16/2018   Procedure: CORONARY ARTERY BYPASS GRAFT times three      with left internal mammary artery and endoharvest of right leg greater saphenous vein;  Surgeon: Melrose Nakayama, MD;  Location: [facility];  Service: Open Heart Surgery;  Laterality: N/A;  . CORONARY STENT INTERVENTION N/A 07/15/2019   Procedure: CORONARY STENT  INTERVENTION;  Surgeon: Wellington Hampshire, MD;  Location: [facility] CV LAB;  Service: Cardiovascular;  Laterality: N/A;  . DG AV DIALYSIS  SHUNT ACCESS EXIST*L* OR     left arm  . EPIGASTRIC HERNIA REPAIR N/A 02/19/2017   Procedure: HERNIA REPAIR EPIGASTRIC ADULT;  Surgeon: Robert Bellow, MD;  Location: [facility] ORS;  Service: General;  Laterality: N/A;  . EYE SURGERY     bilateral cataract  . HERNIA REPAIR     UMBILICAL HERNIA  . HERNIA REPAIR  02/19/2017  . INNER EAR SURGERY     20 years ago  . KIDNEY TRANSPLANT    . LEFT HEART CATH AND CORS/GRAFTS ANGIOGRAPHY N/A 07/15/2019   Procedure: LEFT HEART CATH AND CORS/GRAFTS ANGIOGRAPHY;  Surgeon: Wellington Hampshire, MD;  Location: [facility] CV LAB;  Service: Cardiovascular;  Laterality: N/A;  . LOWER EXTREMITY ANGIOGRAPHY Right 08/25/2019   Procedure: LOWER EXTREMITY ANGIOGRAPHY;  Surgeon: Algernon Huxley, MD;  Location: [facility] CV LAB;  Service: Cardiovascular;  Laterality: Right;  . LOWER EXTREMITY ANGIOGRAPHY Left 09/29/2019   Procedure: Lower Extremity Angiography;  Surgeon: Algernon Huxley, MD;  Location: [facility] CV LAB;  Service: Cardiovascular;  Laterality: Left;  . PERIPHERAL VASCULAR INTERVENTION Right 12/22/2019   Procedure: PERIPHERAL VASCULAR INTERVENTION;  Surgeon: Marty Heck, MD;  Location: [facility] CV LAB;  Service: Cardiovascular;  Laterality: Right;  . Peritoneal Dialysis access placement  02/19/2017  . PSEUDOANERYSM COMPRESSION N/A 09/29/2019   Procedure: PSEUDOANERYSM COMPRESSION;  Surgeon: Algernon Huxley, MD;  Location: [facility];  Service: Cardiovascular;  Laterality: N/A;  . REMOVAL OF A DIALYSIS CATHETER N/A 11/23/2017   Procedure: REMOVAL OF A PERITONEAL DIALYSIS CATHETER;  Surgeon: Algernon Huxley, MD;  Location: [facility];  Service: Vascular;  Laterality: N/A;  . RIGHT/LEFT HEART CATH AND CORONARY ANGIOGRAPHY Bilateral 06/07/2018   Procedure: RIGHT/LEFT HEART CATH AND CORONARY ANGIOGRAPHY;   Surgeon: Wellington Hampshire, MD;  Location: [facility] CV LAB;  Service: Cardiovascular;  Laterality: Bilateral;  . TEE WITHOUT CARDIOVERSION N/A 07/21/2016   Procedure: TRANSESOPHAGEAL ECHOCARDIOGRAM (TEE);  Surgeon: Wellington Hampshire, MD;  Location: [facility];  Service: Cardiovascular;  Laterality: N/A;  . TEE WITHOUT CARDIOVERSION N/A 09/16/2018   Procedure: TRANSESOPHAGEAL ECHOCARDIOGRAM (TEE);  Surgeon: Melrose Nakayama, MD;  Location: [facility];  Service: Open Heart Surgery;  Laterality: N/A;   Social History:  reports that he has never smoked. He has never used smokeless tobacco. He reports previous alcohol use. He reports that he does not use drugs.  Family / Support Systems Marital Status: Divorced Patient Roles: Partner,Parent Spouse/Significant Other: Jeremy [name]-[phone] Children: Jeremy [name]-daughter [phone] Other Supports: Friends Anticipated Caregiver: Jeremy [name] Ability/Limitations of Caregiver: Jeremy [name] works from home so can provide assist to pt Caregiver Availability: 24/7 Family Dynamics: Close knit with girlfriend and daughter between them they will make sure he has what he needs. He is hoping to be  as independent as possible before discharge.  Social History Preferred language: English Religion: Christian Cultural Background: No issues Education: HS Read: Yes Write: Yes Employment Status: Disabled Date Retired/Disabled/Unemployed: Was still working at [employer] prior to leg getting worse Public relations account executive Issues: No issues Guardian/Conservator: None-according to MD pt is capable of making his own deisions while here   Abuse/Neglect Abuse/Neglect Assessment Can Be Completed: Yes Physical Abuse: Denies Verbal Abuse: Denies Sexual Abuse: Denies Exploitation of patient/patient's resources: Denies Self-Neglect: Denies  Emotional Status Pt's affect, behavior and adjustment status: Pt is motivated to do well here and recover from his amputation. He is ready  to do rehab and get back to as independent as he can. He has been even with his health issues Recent Psychosocial Issues: other health issues Psychiatric History: No history deferred depression screen due to coping appropriately and motivated to begin therapies today. May benefit from seeing neuro-psych while here Substance Abuse History: No issues  Patient / Family Perceptions, Expectations & Goals Pt/Family understanding of illness & functional limitations: Pt and Jeremy [name] can explain his amputation and medical issues. Both have spoken with the MD to get their questions answered and addressed. Both glad he is here on rehab Premorbid pt/family roles/activities: Boyfriend, father, dialysis pt, friend, etc Anticipated changes in roles/activities/participation: resume Pt/family expectations/goals: Pt states: " I want to be able to do as much as I can for myself before I leave here."  Jeremy [name] states: " I hope he does well here."  [employer]: Other (Comment) ([facility] in [place] HD) Premorbid Home Care/DME Agencies: Other (Comment) (has rollator and canes) Transportation available at discharge: Jeremy [name] pt did drive up until two weeks ago Resource referrals recommended: Neuropsychology  Discharge Planning Living Arrangements: Spouse/significant other Support Systems: Spouse/significant other,Children,Friends/neighbors Type of Residence: Private residence Insurance Resources: [employer] Financial Resources: Family Support,[employer] Financial Screen Referred: No Living Expenses: Rent Money Management: Patient,Significant Other Does the patient have any problems obtaining your medications?: No Home Management: Both he and Landscape architect Plans: Return home with Jeremy [name] who works from home and can provide assist if needed. She was trasnporting him to HD recently and will again if can with work schedule. Aware team evaluating and setting goals for stay  here. Care Coordinator Anticipated Follow Up Needs: HH/OP,Support Group  Clinical Impression Pleasant motivated gentleman who is ready to work in therapies to get as independent as he can be when he goes home. Aware team conference tomorrow and will work on safe discharge plan. Jeremy [name] can assist due to works from home.  Jeremy [name] 04/24/2020, 9:49 AM

## 2020-04-24 NOTE — Progress Notes (Signed)
Inpatient Rehabilitation  Patient information reviewed and entered into eRehab system by Makari Portman M. Shawnta Zimbelman, M.A., CCC/SLP, PPS Coordinator.  Information including medical coding, functional ability and quality indicators will be reviewed and updated through discharge.    

## 2020-04-24 NOTE — Progress Notes (Signed)
   04/24/20 0800  Assess: MEWS Score  Temp 98.2 F (36.8 C)  BP (!) 144/83  Pulse Rate (!) 112  Resp 18  SpO2 100 %  O2 Device Room Air  Assess: MEWS Score  MEWS Temp 0  MEWS Systolic 0  MEWS Pulse 2  MEWS RR 0  MEWS LOC 0  MEWS Score 2  MEWS Score Color Yellow  Assess: if the MEWS score is Yellow or Red  Were vital signs taken at a resting state? Yes  Focused Assessment No change from prior assessment  Early Detection of Sepsis Score *See Row Information* Low  MEWS guidelines implemented *See Row Information* Yes  Treat  MEWS Interventions Administered scheduled meds/treatments (metoprolol given)  Pain Scale 0-10  Pain Score 7  Pain Type Acute pain;Surgical pain  Pain Location Leg  Pain Orientation Right  Pain Descriptors / Indicators Aching;Burning  Pain Frequency Intermittent  Pain Onset With Activity  Patients Stated Pain Goal 4  Pain Intervention(s) Medication (See eMAR) (percocet given)  Multiple Pain Sites No  Take Vital Signs  Increase Vital Sign Frequency  Yellow: Q 2hr X 2 then Q 4hr X 2, if remains yellow, continue Q 4hrs  Escalate  MEWS: Escalate Yellow: discuss with charge nurse/RN and consider discussing with provider and RRT  Notify: Charge Nurse/RN  Name of Charge Nurse/RN Notified Deborah-RN  Date Charge Nurse/RN Notified 04/24/20  Time Charge Nurse/RN Notified [id number]  Notify: Provider  Provider Name/Title Pam-PA  Date Provider Notified 04/24/20  Time Provider Notified 0900  Notification Type Face-to-face  Notification Reason Other (Comment) (Yellow MEWs)  Response No new orders  Date of Provider Response 04/24/20  Time of Provider Response 0900  Document  Patient Outcome Stabilized after interventions;Other (Comment) (will assess vitals per protocol. pt stable and no distress noted.)

## 2020-04-24 NOTE — Progress Notes (Addendum)
ANTICOAGULATION CONSULT NOTE - Follow Up Consult  Pharmacy Consult for Warfarin Indication: Afib + MechAVR  Labs: Recent Labs    04/22/20 0128 04/23/20 0112 04/24/20 0512  HGB 8.9* 9.1*  --   HCT 28.1* 30.0*  --   PLT 147* 150  --   LABPROT 37.1* 47.4* 52.6*  INR 3.9* 5.4* 6.1*  CREATININE 7.88* 9.93*  --     Assessment: 65 yo male on warfarin for mech AVR/afib now s/p R BKA. INR was 2 on admission, down from 10.2 on 1/3. Warfarin was held for BKA but when restarted at 0.5mg  on 1/10, INR increased to 4.7. Vitamin K 1mg  PO given on 1/12 with INR down to goal 3.1. INR is extremely sensitive to 0.5- 1mg  doses.  INR up to 6.1 despite holding x3 days. Patient is eating 50-85% of meals. No CBC today but H/H & plt stable as of 1/17. Discussed with MD to consider giving 1mg  PO vitamin K today, MD agrees.   Warfarin PTA: 1mg  daily  Goal of Therapy:  INR goal 2.5 to 3.5 Monitor platelets per protocol : Yes    Plan:  Hold warfarin again today Give vitamin K PO 1mg  x1  Follow daily PT/INR and weekly CBC    Benetta Spar, PharmD, BCPS, BCCP Clinical Pharmacist  Please check AMION for all [place] phone numbers After 10:00 PM, call [facility]

## 2020-04-24 NOTE — Progress Notes (Signed)
RT set up patient home unit CPAP at beside. Patient stated he is able to place his mask on when he is ready. No assistance is needed.

## 2020-04-24 NOTE — Progress Notes (Signed)
PA Pam was called by charge nurse, awaiting call back.

## 2020-04-24 NOTE — Progress Notes (Signed)
Patient feels tired due to lack of HD since Friday. Dicussed home meds-->he was on prednisone and plaquenil for RA. Will resume. Plavix has been on hold since acute--contacted [facility] regarding resumption.   Wound with minimal drainage and ischemic edges--pictures taken this am.  Medial aspect  Lateral aspect

## 2020-04-24 NOTE — Progress Notes (Addendum)
CRITICAL VALUE ALERT  Critical Value:  INR 6.1 Pt alert and responsive. No active bleeding. Previous INR was 5.4. Coumadin was held.  Date & Time Notied:  04/24/2020 @ 6:15am  Provider Notified: will notify  provider.

## 2020-04-24 NOTE — Progress Notes (Signed)
[facility] Individual Statement of Services  Patient Name:  LAVONTE PALOS  Date:  04/24/2020  Welcome to the [facility].  Our goal is to provide you with an individualized program based on your diagnosis and situation, designed to meet your specific needs.  With this comprehensive rehabilitation program, you will be expected to participate in at least 3 hours of rehabilitation therapies Monday-Friday, with modified therapy programming on the weekends.  Your rehabilitation program will include the following services:  Physical Therapy (PT), Occupational Therapy (OT), 24 hour per day rehabilitation nursing, Neuropsychology, Care Coordinator, Rehabilitation Medicine, Nutrition Services and Pharmacy Services  Weekly team conferences will be held on Wednesday to discuss your progress.  Your Inpatient Rehabilitation Care Coordinator will talk with you frequently to get your input and to update you on team discussions.  Team conferences with you and your family in attendance may also be held.  Expected length of stay: 7-10 days  Overall anticipated outcome: independent-min for ambulation  Depending on your progress and recovery, your program may change. Your Inpatient Rehabilitation Care Coordinator will coordinate services and will keep you informed of any changes. Your Inpatient Rehabilitation Care Coordinator's name and contact numbers are listed  below.  The following services may also be recommended but are not provided by the [facility] will be made to provide these services after discharge if needed.  Arrangements include referral to agencies that provide these services.  Your insurance has been verified to be:  [place] Your primary doctor is:  Tommi Rumps  Pertinent information  will be shared with your doctor and your insurance company.  Inpatient Rehabilitation Care Coordinator:  Ovidio Kin, [place] or Emilia Beck  Information discussed with and copy given to patient by: Elease Hashimoto, 04/24/2020, 11:14 AM

## 2020-04-24 NOTE — Progress Notes (Signed)
Pt to floor via bed, VSS, pt admitted to unit. CIR protocols and safety precautions reviewed , COVID policy reviewed, questions answered. Pt has call light within reach bed in low position 3 SR up and bed alarm set.

## 2020-04-24 NOTE — Progress Notes (Signed)
[facility] PHYSICAL MEDICINE & REHABILITATION PROGRESS NOTE  Subjective/Complaints: Patient seen sitting up this morning.  He states he slept well overnight after getting in his room and getting in the bed.  He notes that he was not transferred until late last night.  He also notes that hemodialysis did not occur yesterday.  ROS: Denies CP, SOB, N/V/D  Objective: Vital Signs: Blood pressure (!) 144/83, pulse (!) 112, temperature 98.2 F (36.8 C), temperature source Oral, resp. rate 18, height 5\' 10"  (1.778 m), weight 71.5 kg, SpO2 100 %. No results found. Recent Labs    04/22/20 0128 04/23/20 0112  WBC 5.9 5.7  HGB 8.9* 9.1*  HCT 28.1* 30.0*  PLT 147* 150   Recent Labs    04/22/20 0128 04/23/20 0112  NA 139 138  K 3.8 4.2  CL 97* 95*  CO2 27 27  GLUCOSE 138* 101*  BUN 37* 45*  CREATININE 7.88* 9.93*  CALCIUM 10.3 10.3    Intake/Output Summary (Last 24 hours) at 04/24/2020 0839 Last data filed at 04/24/2020 0715 Gross per 24 hour  Intake 50 ml  Output -  Net 50 ml        Physical Exam: BP (!) 144/83 (BP Location: Right Arm)   Pulse (!) 112   Temp 98.2 F (36.8 C) (Oral)   Resp 18   Ht 5\' 10"  (1.778 m)   Wt 71.5 kg   SpO2 100%   BMI 22.62 kg/m  Constitutional: No distress . Vital signs reviewed. HENT: Normocephalic.  Atraumatic. Eyes: EOMI. No discharge. Cardiovascular: No JVD.  Irregularly irregular. + Murmur. Respiratory: Normal effort.  No stridor.  Bilateral clear to auscultation. GI: Non-distended.  BS +. Skin: Warm and dry.   Right BKA with erythema along staple lines and posterior flap Psych: Normal mood.  Normal behavior. Musc: Right BKA with edema and tenderness. Neuro: Alert and oriented Motor: Bilateral upper extremities: 5/5 proximal distal Left lower extremity: 5/5 proximal distal Right lower extremity: Hip flexion 3+/5, knee extension 4 -/5   Assessment/Plan: 1. Functional deficits which require 3+ hours per day of interdisciplinary  therapy in a comprehensive inpatient rehab setting.  Physiatrist is providing close team supervision and 24 hour management of active medical problems listed below.  Physiatrist and rehab team continue to assess barriers to discharge/monitor patient progress toward functional and medical goals   Care Tool:  Bathing              Bathing assist       Upper Body Dressing/Undressing Upper body dressing        Upper body assist      Lower Body Dressing/Undressing Lower body dressing            Lower body assist       Toileting Toileting    Toileting assist       Transfers Chair/bed transfer  Transfers assist           Locomotion Ambulation   Ambulation assist              Walk 10 feet activity   Assist           Walk 50 feet activity   Assist           Walk 150 feet activity   Assist           Walk 10 feet on uneven surface  activity   Assist           Wheelchair  Assist               Wheelchair 50 feet with 2 turns activity    Assist            Wheelchair 150 feet activity     Assist           Medical Problem List and Plan: 1.Impaired function and gait and 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 Pain Management:Continue robaxin 500 mg tid and oxycodone prnis effective.  Monitor with increased exertion 4. Mood:LCSW to follow for evaluation and support. -antipsychotic agents: N/A 5. Neuropsych: This patientiscapable of making decisions on hisown behalf. 6. Skin/Wound Care:Monitor wound for healing. Prosource to help promote wound healing. 7.  Fluids/Electrolytes/Nutrition:Strict I/Os. Renal diet with 1200 cc /FR. 8. Renal cancer s/p B-nephrectomy/ESRD:  Schedule HD- usually M/W/F-at the end of the day to help with tolerance of therapy.    Renevala with meals to manage metabolic bone disease.  9. A fib: Monitor HR tid- on coumadin.   Continue Metoprolol bid.   Monitor with increased activity 10. Anemia of chronic disease/macrocytosis: On aranesp every Monday for supplementation. Monitor for signs of bleeding.   Hemoglobin 9.1 on 1/17, labs with HD  Vitamin B12/folate ordered with next of labs 11. GERD: continue Protonix with simethicone prn.  12. CAD s/p CABG: On Crestor and metoprolol. Monitor for symptoms with activity. 13. OSA: Continue CPAP  LOS: 1 days A FACE TO FACE EVALUATION WAS PERFORMED  Ankit Lorie Phenix 04/24/2020, 8:39 AM

## 2020-04-24 NOTE — H&P (Signed)
Physical Medicine and Rehabilitation Admission H&P     CC: Functional deficits due to R-BKA     HPI: Jeremy [name] is a 65 year old male with history of renal cell CA s/p failed transplant--HD-MWF , mechanical AVR/A fib-on coumadin, OSA, PVD with multiple stents LLE and right foot ulcer s/p angio and stenting in attempts at limb salvage. He was admitted on 04/12/20 with critical limb ischemia with right foot gangrene and BKA recommended. 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 Carlis Abbott. Post op course significant for A fib with RVR requiring IV BB, poor pain control as well as fevers on 01/11-->. BC X 2 negative. CXR showed question of LLL cavitary mass but CT chest negative.  Coumadin being managed with pharmacy but INR has been labile --received vitamin K for INR up to 4.7--INR back up to 5.4 today. Duskiness around R-BKA stump being monitored due to concerns of needing revision but felt to be due to anticoagulation. Patient continues to be limited by R-BKA, BUE weakness as well as decreased activity tolerance. CIR recommended due to functional decline.     Of note, pt reports 5 stents in LLE and 2 balloons in RLE- thinks "duskiness" of R BKA due to anticoagulation- but also has some unpopped blisters.      Pt reports he doesn't void at all.  LBM yesterday Denies a lot of pain, but has been having some phantom pain- the pain meds help some.      Review of Systems  Constitutional: Negative for chills and fever.  HENT: Negative for hearing loss.   Eyes: Negative for blurred vision and double vision.  Respiratory: Positive for shortness of breath (with increased activity). Negative for cough.   Cardiovascular: Negative for chest pain and palpitations.  Genitourinary: Negative.        Does not produce urine.   Musculoskeletal: Positive for joint pain (left knee numbness --new since admission. ). Negative for back pain, myalgias and neck pain.  Neurological:  Positive for dizziness and sensory change (LLE--since aneurysm/hematoma. ).  Psychiatric/Behavioral: The patient is not nervous/anxious.   All other systems reviewed and are negative.         Past Medical History:  Diagnosis Date  . Abnormal stress test 03/19/2018  . 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 Aortic stenosis      a. 2014 s/p mech AVR ([facility])-->chronic coumadin; b. 08/2018 Echo: Mild AS/AI. Nl fxn/gradient.  . Arthritis    . Atrial fibrillation ([place])    . CAD (coronary artery disease)      a. 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 02/2018 MV [facility]): large inf, infsept, inflat infarct w/ min rev, EF 30%; c. 06/2018 Cath: LM min irregs, LAD 60p, 95/30/6m, LCX min irregs, RCA 40p, 6m, 90d, RPAV 90, RPDA 95ost; d. 09/2018 CABGx3 (LIMA->LAD, VG->D1, VG->PDA).  . Chronic anticoagulation 06/2012    a. Coumadin in the setting of mech AVR and afib.  Marland Kitchen COVID-19 03/15/2019  . CPAP (continuous positive airway pressure) dependence    . Dialysis patient [facility]) 04/07/2006    Patient started HD around 1996,  had 1st transplant LLQ around 1998 until 2007 when they found renal cell cancer in transplant and both native kidneys, all were removed > went back on HD until 2nd transplant RLQ in 2014 which lasted 3 yrs; it was embolized for suspected  acute rejection. Is currently on HD MWF in [place] w/ [facility] nephrology.  . ESRD (end stage renal disease) ([place])      a. CKD 2/2 to IgA nephropathy (dx age 56); b. 1999 s/p Kidney transplant; c. 2014 Bilat nephrectomy (transplanted kidney resected) in the setting of renal cell carcinoma; d. PD until 11/2017-->HD.  Marland Kitchen Heart murmur    . HFrEF (heart failure with reduced ejection fraction) ([place])      a. 07/2016 Echo: EF 55-60%; b. 02/2018 MV: EF 30%; c. 04/2018 Echo: EF 35%; d. 08/2018 Echo: EF 30-35%. Glob HK w/o rwma. Mildly reduced RV fxn. Mod to sev dil LA. Nl AoV fxn.  Marland Kitchen Hx of colonoscopy       2009 by Dr. Laural Golden. Normal except for small submucosal lipoma. No evidence of polyps or colitis.  . Hyperlipidemia    . Hypertension    . Hypogonadism in male 07/25/2015  . Ischemic cardiomyopathy      a. 07/2016 Echo: EF 55-60%; b. 02/2018 MV: EF 30%; c. 04/2018 Echo: EF 35%; d. 08/2018 Echo: EF 30-35%.  . OSA (obstructive sleep apnea)      No CPAP  . Permanent atrial fibrillation ([facility])      a. CHA2DS2VASc = 3-->Coumadin (also mech AVR); b. 06/2018 Amio started 2/2 elevated rates.  . Renal cell cancer ([place])    . Renal cell carcinoma ([place]) 08/04/2016           Past Surgical History:  Procedure Laterality Date  . ABDOMINAL AORTOGRAM W/LOWER EXTREMITY N/A 12/22/2019    Procedure: ABDOMINAL AORTOGRAM W/LOWER EXTREMITY;  Surgeon: Marty Heck, MD;  Location: [place] CV [facility];  Service: Cardiovascular;  Laterality: N/A;  . AMPUTATION Right 04/14/2020    Procedure: RIGHT BELOW KNEE AMPUTATION;  Surgeon: Marty Heck, MD;  Location: [place];  Service: Vascular;  Laterality: Right;  . AORTIC VALVE REPLACEMENT   3 /11 /2014    At [facility]      . CAPD INSERTION N/A 02/19/2017    Procedure: LAPAROSCOPIC INSERTION CONTINUOUS AMBULATORY PERITONEAL DIALYSIS  (CAPD) CATHETER;  Surgeon: Algernon Huxley, MD;  Location: [facility];  Service: General;  Laterality: N/A;  . CORONARY ANGIOPLASTY WITH STENT PLACEMENT      . CORONARY ARTERY BYPASS GRAFT N/A 09/16/2018    Procedure: CORONARY ARTERY BYPASS GRAFT times three      with left internal mammary artery and endoharvest of right leg greater saphenous vein;  Surgeon: Melrose Nakayama, MD;  Location: [place];  Service: Open Heart Surgery;  Laterality: N/A;  . CORONARY STENT INTERVENTION N/A 07/15/2019    Procedure: CORONARY STENT INTERVENTION;  Surgeon: Wellington Hampshire, MD;  Location: [place] [facility];  Service: Cardiovascular;  Laterality: N/A;  . DG AV DIALYSIS  SHUNT ACCESS EXIST*L* OR         left arm  . EPIGASTRIC HERNIA REPAIR N/A 02/19/2017    Procedure: HERNIA REPAIR EPIGASTRIC ADULT;  Surgeon: Robert Bellow, MD;  Location: [facility];  Service: General;  Laterality: N/A;  . EYE SURGERY        bilateral cataract  . HERNIA REPAIR        UMBILICAL HERNIA  . HERNIA REPAIR   02/19/2017  . INNER EAR SURGERY        20 years ago  . KIDNEY TRANSPLANT      . LEFT HEART CATH AND CORS/GRAFTS ANGIOGRAPHY N/A 07/15/2019    Procedure:  LEFT HEART CATH AND CORS/GRAFTS ANGIOGRAPHY;  Surgeon: Wellington Hampshire, MD;  Location: [place] CV [facility];  Service: Cardiovascular;  Laterality: N/A;  . LOWER EXTREMITY ANGIOGRAPHY Right 08/25/2019    Procedure: LOWER EXTREMITY ANGIOGRAPHY;  Surgeon: Algernon Huxley, MD;  Location: [place] [facility];  Service: Cardiovascular;  Laterality: Right;  . LOWER EXTREMITY ANGIOGRAPHY Left 09/29/2019    Procedure: Lower Extremity Angiography;  Surgeon: Algernon Huxley, MD;  Location: [place] [facility];  Service: Cardiovascular;  Laterality: Left;  . PERIPHERAL VASCULAR INTERVENTION Right 12/22/2019    Procedure: PERIPHERAL VASCULAR INTERVENTION;  Surgeon: Marty Heck, MD;  Location: [place] [facility];  Service: Cardiovascular;  Laterality: Right;  . Peritoneal Dialysis access placement   02/19/2017  . PSEUDOANERYSM COMPRESSION N/A 09/29/2019    Procedure: PSEUDOANERYSM COMPRESSION;  Surgeon: Algernon Huxley, MD;  Location: [place] CV [facility];  Service: Cardiovascular;  Laterality: N/A;  . REMOVAL OF A DIALYSIS CATHETER N/A 11/23/2017    Procedure: REMOVAL OF A PERITONEAL DIALYSIS CATHETER;  Surgeon: Algernon Huxley, MD;  Location: [facility];  Service: Vascular;  Laterality: N/A;  . RIGHT/LEFT HEART CATH AND CORONARY ANGIOGRAPHY Bilateral 06/07/2018    Procedure: RIGHT/LEFT HEART CATH AND CORONARY ANGIOGRAPHY;  Surgeon: Wellington Hampshire, MD;  Location: [place] [facility];  Service: Cardiovascular;  Laterality: Bilateral;  . TEE WITHOUT CARDIOVERSION N/A 07/21/2016     Procedure: TRANSESOPHAGEAL ECHOCARDIOGRAM (TEE);  Surgeon: Wellington Hampshire, MD;  Location: [facility];  Service: Cardiovascular;  Laterality: N/A;  . TEE WITHOUT CARDIOVERSION N/A 09/16/2018    Procedure: TRANSESOPHAGEAL ECHOCARDIOGRAM (TEE);  Surgeon: Melrose Nakayama, MD;  Location: [place];  Service: Open Heart Surgery;  Laterality: N/A;      Family History  Problem Relation Age of Onset  . Cancer Mother          pancreatic  . Heart disease Father    . Diabetes Father        Social History:  Lives with GF. Worked for [employer]. He reports that he has never smoked. He has never used smokeless tobacco. He reports previous alcohol use. He reports that he does not use drugs.           Allergies  Allergen Reactions  . Statins Other (See Comments)      Arthralgias (intolerance), Myalgias (intolerance)   Pt taking pravastatin   . Gabapentin        Drowsy   . Sertraline Rash               Facility-Administered Medications Prior to Admission  Medication Dose Route Frequency Provider Last Rate Last Admin  . 0.9 %  sodium chloride infusion  250 mL Intravenous PRN Marty Heck, MD        Medications Prior to Admission  Medication Sig Dispense Refill  . acetaminophen (TYLENOL) 500 MG tablet Take 1,000 mg 2 (two) times daily as needed by mouth for moderate pain.      Marland Kitchen allopurinol (ZYLOPRIM) 300 MG tablet Take 300 mg by mouth daily.   5  . cinacalcet (SENSIPAR) 30 MG tablet Take 30 mg by mouth daily.       . clopidogrel (PLAVIX) 75 MG tablet Take 1 tablet (75 mg total) by mouth daily with breakfast. 90 tablet 3  . ezetimibe (ZETIA) 10 MG tablet TAKE 1 TABLET BY MOUTH EVERY DAY (Patient taking differently: Take 10 mg by mouth daily.) 90 tablet 1  . hydroxychloroquine (PLAQUENIL) 200  MG tablet Take 400 mg by mouth at bedtime.      . lamoTRIgine (LAMICTAL) 25 MG tablet Take 2 tablets (50 mg total) by mouth 2 (two) times daily. 120 tablet 5  . lidocaine-prilocaine (EMLA) cream  Apply 1 application topically daily as needed (for fistula).      . loperamide (IMODIUM A-D) 2 MG tablet Take 2 mg by mouth 4 (four) times daily as needed for diarrhea or loose stools.      . metoprolol tartrate 75 MG TABS Take 75 mg by mouth 2 (two) times daily. 180 tablet 3  . midodrine (PROAMATINE) 10 MG tablet Take 10 mg by mouth every Monday, Wednesday, and Friday.      . multivitamin (RENA-VIT) TABS tablet Take 1 tablet by mouth daily.      . nitroGLYCERIN (NITROSTAT) 0.4 MG SL tablet Place 1 tablet (0.4 mg total) under the tongue every 5 (five) minutes as needed for chest pain. 25 tablet 3  . oxyCODONE (OXY IR/ROXICODONE) 5 MG immediate release tablet Take 1-2 tablets (5-10 mg total) by mouth every 4 (four) hours as needed for severe pain. 30 tablet 0  . pantoprazole (PROTONIX) 40 MG tablet Take 1 tablet (40 mg total) by mouth daily. 90 tablet 3  . predniSONE (DELTASONE) 5 MG tablet Take 5 mg by mouth daily.      . rosuvastatin (CRESTOR) 40 MG tablet Take 1 tablet (40 mg total) by mouth daily at 6 PM. 90 tablet 3  . sevelamer carbonate (RENVELA) 800 MG tablet Take 3,200 mg by mouth 3 (three) times daily with meals.      . traZODone (DESYREL) 50 MG tablet TAKE 1/2 -1 TABLET (25-50 MG TOTAL) BY MOUTH AT BEDTIME AS NEEDED FOR SLEEP. (Patient taking differently: Take 25-50 mg by mouth at bedtime.) 90 tablet 2  . azelastine (ASTELIN) 0.1 % nasal spray PLACE 1 SPRAY INTO BOTH NOSTRILS 2 (TWO) TIMES DAILY. USE IN EACH NOSTRIL AS DIRECTED (Patient not taking: No sig reported) 30 mL 3  . benzonatate (TESSALON) 200 MG capsule Take 1 capsule (200 mg total) by mouth 3 (three) times daily as needed for cough. (Patient not taking: Reported on 04/11/2020) 30 capsule 1  . Epoetin Alfa-epbx (RETACRIT IJ) Epoetin alfa - epbx (Retacrit)      . iron sucrose in sodium chloride 0.9 % 100 mL Iron Sucrose (Venofer)      . warfarin (COUMADIN) 1 MG tablet TAKE 1 MG DAILY ON EVERY DAY EXCEPT TAKE 0.5 MG ON FRIDAYS. *OR AS  DIRECTED* (Patient taking differently: Take 1 mg by mouth daily.) 90 tablet 3      Drug Regimen Review  Drug regimen was reviewed and remains appropriate with no significant issues identified   Home: Home Living Family/patient expects to be discharged to:: Private residence Living Arrangements: Spouse/significant other (girlfriend, Veterinary surgeon) Available Help at Discharge: Available PRN/intermittently Type of Home: House Home Access: Stairs to enter [employer] of Steps: 1 Home Layout: One level,Able to live on main level with bedroom/bathroom Bathroom Shower/Tub: Multimedia programmer: Standard Bathroom Accessibility: Yes Home Equipment: Environmental consultant - 2 wheels,Walker - 4 wheels,Cane - single point  Lives With: Significant other   Functional History: Prior Function Level of Independence: Independent with assistive device(s) Comments: Mod I with ADLs/IADLs and use of rollator. Patient was driving.   Functional Status:  Mobility: Bed Mobility Overal bed mobility: Needs Assistance Bed Mobility: Rolling (scooting up) Rolling: Supervision Supine to sit: Min guard Sit to supine: Min guard  General bed mobility comments: supervision for safety and needs of pt Transfers Overall transfer level: Needs assistance Equipment used: Rolling walker (2 wheeled) Transfers: Sit to/from [employer] Sit to Stand: Mod assist Stand pivot transfers: Mod assist General transfer comment: declines OOB after his HD Ambulation/Gait Ambulation/Gait assistance: Mod assist Gait Distance (Feet): 4 Feet (+ 10 ft) Assistive device: Rolling walker (2 wheeled) Gait Pattern/deviations: Step-to pattern,Trunk flexed,Antalgic General Gait Details: Cues for hip extension and upper trunk control this session.  Pt fatigues quickly with short bout of gt training in room.   ADL: ADL Overall ADL's : Needs assistance/impaired Grooming: Set up,Sitting Lower Body Dressing: Moderate  assistance,Sit to/from stand Lower Body Dressing Details (indicate cue type and reason): Able to doff L sock and don L shoe with set-up assist. Would likely require Mod A to thread BLE and hike LB clothing over hips in standing with assist to maintain standing balance. Toilet Transfer: Moderate assistance,Stand-pivot Toilet Transfer Details (indicate cue type and reason): Simulated with stand-pivot transfer to recliner. General ADL Comments: Pt recently back to bed after sitting up in recliner and PT session but agreeable to OT session. completed bed mobility 3x sit<>stands EOB, pivotal steps to simunlate toilet transfer to [facility].   Cognition: Cognition Overall Cognitive Status: Within Functional Limits for tasks assessed Orientation Level: Oriented X4 Cognition Arousal/Alertness: Awake/alert Behavior During Therapy: WFL for tasks assessed/performed Overall Cognitive Status: Within Functional Limits for tasks assessed General Comments: Drowsy from pain medications, intermittently falling asleep     Blood pressure 122/74, pulse (!) 112, temperature 98.2 F (36.8 C), temperature source Oral, resp. rate 18, height 5\' 10"  (1.778 m), weight 82.7 kg, SpO2 100 %. Physical Exam Vitals and nursing note reviewed.  Constitutional:      Appearance: Normal appearance.     Comments: Older male sitting up in bed, appropriate, appears younger than stated age, comfortable, NAD  HENT:     Head: Normocephalic and atraumatic.     Comments: Smile equal- conjugate gaze    Right Ear: External ear normal.     Left Ear: External ear normal.     Nose: Nose normal. No congestion.     Mouth/Throat:     Mouth: Mucous membranes are dry.     Pharynx: Oropharynx is clear. No oropharyngeal exudate.  Eyes:     General:        Right eye: No discharge.        Left eye: No discharge.     Extraocular Movements: Extraocular movements intact.  Cardiovascular:     Rate and Rhythm: Tachycardia present. Rhythm irregular.      Comments: Rate in low 100s- irregular /afib; murmur from mechanical valve Pulmonary:     Comments: CTA B/L- no W/R/R- good air movement Abdominal:     Comments: Soft, NT, ND, (+)BS - hypoactive  Musculoskeletal:     Cervical back: Normal range of motion. No rigidity.     Left lower leg: No edema.     Comments: UEs- 5/5 in biceps, triceps, WE and grip B/L LLE- 5/5 in HF, KE, KF, DF and PF RLE-  HF and KE 5-/5- not limited a lot by pain R BKA- wrapped Crooked and stiff fingers  Skin:    General: Skin is warm and dry.     Comments: Thrill in fistula in LUE- forearm R forearm IV- looks OK A lot of purple bruising around R BKA- staples intac-t no drainage; a few blisters around medial staples line; dog ears  still notable Little scab near R thumb base- small  Neurological:     Mental Status: He is oriented to person, place, and time.     Comments: Decreased sensation to light touch in LLE and tip of R BKA Normal sensation in R thigh Ox3  Psychiatric:     Comments: appropriate           Lab Results Last 48 Hours        Results for orders placed or performed during the hospital encounter of 04/12/20 (from the past 48 hour(s))  Glucose, capillary     Status: Abnormal    Collection Time: 04/21/20  4:13 PM  Result Value Ref Range    Glucose-Capillary 106 (H) 70 - 99 mg/dL      Comment: Glucose reference range applies only to samples taken after fasting for at least 8 hours.  Protime-INR     Status: Abnormal    Collection Time: 04/22/20  1:28 AM  Result Value Ref Range    Prothrombin Time 37.1 (H) 11.4 - 15.2 seconds    INR 3.9 (H) 0.8 - 1.2      Comment: (NOTE) INR goal varies based on device and disease states. Performed at [facility], [place] [address], [place], [place] [address]    Renal function panel     Status: Abnormal    Collection Time: 04/22/20  1:28 AM  Result Value Ref Range    Sodium 139 135 - 145 mmol/L    Potassium 3.8 3.5 - 5.1 mmol/L    Chloride  97 (L) 98 - 111 mmol/L    CO2 27 22 - 32 mmol/L    Glucose, Bld 138 (H) 70 - 99 mg/dL      Comment: Glucose reference range applies only to samples taken after fasting for at least 8 hours.    BUN 37 (H) 8 - 23 mg/dL    Creatinine, Ser 7.88 (H) 0.61 - 1.24 mg/dL    Calcium 10.3 8.9 - 10.3 mg/dL    Phosphorus 4.5 2.5 - 4.6 mg/dL    Albumin 2.4 (L) 3.5 - 5.0 g/dL    GFR, Estimated 7 (L) >60 mL/min      Comment: (NOTE) Calculated using the CKD-EPI Creatinine Equation (2021)      Anion gap 15 5 - 15      Comment: Performed at [facility] [address], [place], [name] [address]  CBC     Status: Abnormal    Collection Time: 04/22/20  1:28 AM  Result Value Ref Range    WBC 5.9 4.0 - 10.5 K/uL    RBC 2.58 (L) 4.22 - 5.81 MIL/uL    Hemoglobin 8.9 (L) 13.0 - 17.0 g/dL    HCT 28.1 (L) 39.0 - 52.0 %    MCV 108.9 (H) 80.0 - 100.0 fL    MCH 34.5 (H) 26.0 - 34.0 pg    MCHC 31.7 30.0 - 36.0 g/dL    RDW 14.5 11.5 - 15.5 %    Platelets 147 (L) 150 - 400 K/uL    nRBC 0.0 0.0 - 0.2 %      Comment: Performed at [facility], [place] [address], [place], [name] [address]  Protime-INR     Status: Abnormal    Collection Time: 04/23/20  1:12 AM  Result Value Ref Range    Prothrombin Time 47.4 (H) 11.4 - 15.2 seconds    INR 5.4 (HH) 0.8 - 1.2  Comment: REPEATED TO VERIFY CRITICAL RESULT CALLED TO, READ BACK BY AND VERIFIED WITH: RN DARRYL GARDNER BY MESSAN HOUEGNIFIO AT 6962 ON 04/23/2020 (NOTE) INR goal varies based on device and disease states. Performed at [facility], [place] [address], [place], [place] [address]    Renal function panel     Status: Abnormal    Collection Time: 04/23/20  1:12 AM  Result Value Ref Range    Sodium 138 135 - 145 mmol/L    Potassium 4.2 3.5 - 5.1 mmol/L    Chloride 95 (L) 98 - 111 mmol/L    CO2 27 22 - 32 mmol/L    Glucose, Bld 101 (H) 70 - 99 mg/dL      Comment: Glucose reference range applies only to samples taken after fasting for at  least 8 hours.    BUN 45 (H) 8 - 23 mg/dL    Creatinine, Ser 9.93 (H) 0.61 - 1.24 mg/dL    Calcium 10.3 8.9 - 10.3 mg/dL    Phosphorus 5.2 (H) 2.5 - 4.6 mg/dL    Albumin 2.5 (L) 3.5 - 5.0 g/dL    GFR, Estimated 5 (L) >60 mL/min      Comment: (NOTE) Calculated using the CKD-EPI Creatinine Equation (2021)      Anion gap 16 (H) 5 - 15      Comment: Performed at [facility] [address], [place], [name] [address]  CBC     Status: Abnormal    Collection Time: 04/23/20  1:12 AM  Result Value Ref Range    WBC 5.7 4.0 - 10.5 K/uL    RBC 2.72 (L) 4.22 - 5.81 MIL/uL    Hemoglobin 9.1 (L) 13.0 - 17.0 g/dL    HCT 30.0 (L) 39.0 - 52.0 %    MCV 110.3 (H) 80.0 - 100.0 fL    MCH 33.5 26.0 - 34.0 pg    MCHC 30.3 30.0 - 36.0 g/dL    RDW 14.6 11.5 - 15.5 %    Platelets 150 150 - 400 K/uL    nRBC 0.0 0.0 - 0.2 %      Comment: Performed at [facility], [place] [address] [place],  [address]    *Note: Due to a large number of results and/or encounters for the requested time period, some results have not been displayed. A complete set of results can be found in Results Review.      Imaging Results (Last 48 hours)  No results found.           Medical Problem List and Plan: 1.  Impaired function and gait and R BKA secondary to RLE gangrene- still significant vascular changes- picture as above             -patient may  Shower if R BKA is covered             -ELOS/Goals: 10-14 days- mod I to supervision 2.  Mechanical AVR/Antithrombotics: -DVT/anticoagulation:  Pharmaceutical: Coumadin--INR supratherapeutic at 5.4 due to coagulopathy. Coumadin has been held since 01/14             -antiplatelet therapy: N/A 3. Pain Management: Continue robaxin 500 mg tid and oxycodone prn is effective. - did explain phantom pain usually improves by 1 year, phantom sensation is lifetime- might need low dose Cymbalta or gabapentin.   4. Mood: LCSW to follow for evaluation and support.               -  antipsychotic agents: N/A 5. Neuropsych: This patient is capable of making decisions on his own behalf. 6. Skin/Wound Care: Monitor wound for healing. Prosource to help promote wound healing.  7. Fluids/Electrolytes/Nutrition: Strict I/O. Renal diet with 1200 cc /FR. 8. Renal cancer s/p B-nephrectomy/ESRD: has fistula, not catheter-  Schedule HD- usually M/W/F- at the end of the day to help with tolerance of therapy. Renevala with meals to manage metabolic bone disease.  9. A fib: Monitor HR tid- on coumadin. Continue Metoprolol bid.  10. Anemia of chronic disease: On aranesp every Monday for supplementation. Monitor for signs of bleeding. Serial H/H with HD. 11. GERD: continue Protonix with simethicone prn.  12. CAD s/p CABG: On Crestor and metoprolol. Monitor for symptoms with activity.  13. OSA: Resume CPAP--reports multiple episodes of apnea last pm.   14. Tachycardia- was tachycardic in 105 range during admission exam- will monitor, to make sure no RVR in setting of Afib.          Bary Leriche, PA-C 04/23/2020      I have personally performed a face to face diagnostic evaluation of this patient and formulated the key components of the plan.  Additionally, I have personally reviewed laboratory data, imaging studies, as well as relevant notes and concur with the physician assistant's documentation above.

## 2020-04-24 NOTE — Progress Notes (Signed)
Meredith Staggers, MD  Physician  Physical Medicine and Rehabilitation  PMR Pre-admission     Signed  Date of Service:  04/23/2020 10:37 AM      Related encounter: Admission (Discharged) from 04/12/2020 in [facility] 4E CV SURGICAL PROGRESSIVE CARE       Signed          Show:Clear all _0 Manual_1 Template_2 Copied  Added by: _3 Cristina Gong, RN_4 Meredith Staggers, MD   _5 Hover for details  PMR Admission Coordinator Pre-Admission Assessment   Patient: Jeremy [name] is an 65 y.o., male MRN: [id number] DOB: May 25, 1955 Height: _6  (177.8 cm) Weight: 82.7 kg   Insurance Information HMO:     PPO:      PCP:      IPA:      80/20:      OTHER:  PRIMARY: Medicare a and b      Policy#: [id number]      Subscriber: pt Benefits:  Phone #: passport one source     Name:  Eff. Date: 8/1/208     Deduct: $1556      Out of Pocket Max: none      Life Max: none CIR: 100%      SNF: 20 full days Outpatient: 80%     Co-Pay: 20% Home Health: 100%      Co-Pay: none DME: 80%     Co-Pay: 20% Providers: pt choice  SECONDARY: [employer] commercial      Policy#: [id number]     Subscriber: [place] Counselor:       Phone#:    The "Data Collection Information Summary" for patients in Inpatient Rehabilitation Facilities with attached "Privacy Act [place] Records" was provided and verbally reviewed with: Patient   Emergency Contact Information         Contact Information     Name Relation Home Work Mobile    [place] Significant other [place], [place] Daughter [phone]   [phone]         Current Medical History  Patient Admitting Diagnosis: BKA   History of Present Illness:   65 year old male with history of renal cell CA s/p failed transplant--HD-MWF , 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 He was admitted on 04/12/20 with critical limb  ischemia with right foot gangrene and BKA recommended. 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 Carlis Abbott. Post op course significant for A fib with RVR requiring IV BB, poor pain control as well as fevers on 01/11-->. BC X 2 negative. CXR showed question of LLL cavitary mass but CT chest negative.  Coumadin being managed with pharmacy but INR has been labile --received vitamin K for INR up to 4.7--INR back up to 5.4 today. Duskiness around R-BKA stump being monitored due to concerns of needing revision but felt to be due to anticoagulation. Patient continues to be limited by R-BKA, BUE weakness as well as decreased activity tolerance.     Patient's medical record from [facility] has been reviewed by the rehabilitation admission coordinator and physician.   Past Medical History      Past Medical History:  Diagnosis Date  . Abnormal stress test 03/19/2018  . 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 Aortic stenosis      a. 2014 s/p mech AVR ([facility])-->chronic coumadin; b. 08/2018 Echo: Mild AS/AI. Nl fxn/gradient.  Marland Kitchen  Arthritis    . Atrial fibrillation ([place])    . CAD (coronary artery disease)      a. 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 02/2018 MV ([facility]: large inf, infsept, inflat infarct w/ min rev, EF 30%; c. 06/2018 Cath: LM min irregs, LAD 60p, 95/30/48m, LCX min irregs, RCA 40p, 751m90d, RPAV 90, RPDA 95ost; d. 09/2018 CABGx3 (LIMA->LAD, VG->D1, VG->PDA).  . Chronic anticoagulation 06/2012    a. Coumadin in the setting of mech AVR and afib.  . Marland KitchenOVID-19 03/15/2019  . CPAP (continuous positive airway pressure) dependence    [place] [facility]    Patient started HD around 1996,  had 1st transplant LLQ around 1998 until 2007 when they found renal cell cancer in transplant and both native kidneys, all were removed > went back on HD until 2nd transplant RLQ in 2014 which lasted 3 yrs; it  was embolized for suspected acute rejection. [place]  . 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 1999 s/p Kidney transplant; c. 2014 Bilat nephrectomy (transplanted kidney resected) in the setting of renal cell carcinoma; d. PD until 11/2017-->HD.  . Marland Kitcheneart murmur    . HFrEF (heart failure with reduced ejection fraction) ([facility]     a. 07/2016 Echo: EF 55-60%; b. 02/2018 MV: EF 30%; c. 04/2018 Echo: EF 35%; d. 08/2018 Echo: EF 30-35%. Glob HK w/o rwma. Mildly reduced RV fxn. Mod to sev dil LA. Nl AoV fxn.  . Marland Kitchenx of colonoscopy      2009 by Dr. ReLaural GoldenNormal except for small submucosal lipoma. No evidence of polyps or colitis.  . Hyperlipidemia    . Hypertension    . Hypogonadism in male 07/25/2015  . Ischemic cardiomyopathy      a. 07/2016 Echo: EF 55-60%; b. 02/2018 MV: EF 30%; c. 04/2018 Echo: EF 35%; d. 08/2018 Echo: EF 30-35%.  . OSA (obstructive sleep apnea)      No CPAP  . Permanent atrial fibrillation (H[facility])      a. CHA2DS2VASc = 3-->Coumadin (also mech AVR); b. 06/2018 Amio started 2/2 elevated rates.  . Renal cell cancer ([facility]   . Ren[place] cell carcinoma ([facility]2018      Family History   family history includes Cancer in his mother; Diabetes in his father; Heart disease in his father.   Prior Rehab/Hospitalizations Has the patient had prior rehab or hospitalizations prior to admission? Yes   Has the patient had major surgery during 100 days prior to admission? Yes              Current Medications   Current Facility-Administered Medications:  .  acetaminophen (TYLENOL) tablet 500 mg, 500 mg, Oral, Q4H PRN, Early, ToArvilla MeresMD, 500 mg at 04/20/20 0450 .  calcitRIOL (ROCALTROL) capsule 1 mcg, 1 mcg, Oral, Q M,W,F, CoUlyses AmorPAVermont1 mcg at 04/23/20 [id number]  Chlorhexidine Gluconate Cloth 2 % PADS 6 each, 6 each, Topical, Q0600, CoUlyses AmorPA-C, 6 each at 04/23/20 [phone]   Darbepoetin Alfa (ARANESP) injection 60 mcg, 60 mcg, Intravenous, Q Mon-HD, CoUlyses AmorPA-C, 60 mcg at 04/16/20 1034 .  ezetimibe (ZETIA) tablet 10 mg, 10 mg, Oral, Daily, CoLaurence Slate, PA-C, 10 mg at 04/23/20 0919 .  lamoTRIgine (LAMICTAL)  tablet 50 mg, 50 mg, Oral, BID, Laurence Slate M, PA-C, 50 mg at 04/23/20 4235 .  methocarbamol (ROBAXIN) tablet 500 mg, 500 mg, Oral, TID, Laurence Slate M, PA-C, 500 mg at 04/23/20 3614 .  metoprolol tartrate (LOPRESSOR) injection 5 mg, 5 mg, Intravenous, Q6H PRN, Marty Heck, MD, 5 mg at 04/20/20 1523 .  metoprolol tartrate (LOPRESSOR) tablet 75 mg, 75 mg, Oral, BID, Ulyses Amor, PA-C, 75 mg at 04/22/20 2104 .  oxyCODONE-acetaminophen (PERCOCET/ROXICET) 5-325 MG per tablet 1-2 tablet, 1-2 tablet, Oral, Q4H PRN, Ulyses Amor, PA-C, 2 tablet at 04/23/20 [phone] .  pantoprazole (PROTONIX) EC tablet 40 mg, 40 mg, Oral, Daily, Laurence Slate M, PA-C, 40 mg at 04/23/20 0919 .  polyethylene glycol (MIRALAX / GLYCOLAX) packet 17 g, 17 g, Oral, Daily, Dagoberto Ligas, PA-C, 17 g at 04/23/20 0918 .  rosuvastatin (CRESTOR) tablet 40 mg, 40 mg, Oral, q1800, Ulyses Amor, PA-C, 40 mg at 04/22/20 1725 .  sevelamer carbonate (RENVELA) tablet 3,200 mg, 3,200 mg, Oral, TID WC, Collins, [name] M, PA-C, 3,200 mg at 04/23/20 4008 .  simethicone (MYLICON) chewable tablet 80 mg, 80 mg, Oral, QID PRN, Ulyses Amor, PA-C, 80 mg at 04/23/20 6761 .  traZODone (DESYREL) tablet 25 mg, 25 mg, Oral, QHS, Collins, [name] M, PA-C, 25 mg at 04/22/20 2102 .  Warfarin - Pharmacist Dosing Inpatient, , Does not apply, q1600, Marty Heck, MD   Patients Current Diet:     Diet Order                      Diet renal with fluid restriction Fluid restriction: 1200 mL Fluid; Room service appropriate? Yes with Assist; Fluid consistency: Thin  Diet effective now                      Precautions / Restrictions Precautions Precautions: Fall Precaution Comments: R  BKA Restrictions Weight Bearing Restrictions: Yes RLE Weight Bearing: Non weight bearing    Has the patient had 2 or more falls or a fall with injury in the past year? No   Prior Activity Level Community (5-7x/wk): Mod I with rollator and driving   Prior Functional Level Self Care: Did the patient need help bathing, dressing, using the toilet or eating? Independent   Indoor Mobility: Did the patient need assistance with walking from room to room (with or without device)? Independent   Stairs: Did the patient need assistance with internal or external stairs (with or without device)? Independent   Functional Cognition: Did the patient need help planning regular tasks such as shopping or remembering to take medications? Independent   Home Assistive Devices / Equipment Home Assistive Devices/Equipment: None Home Equipment: Walker - 2 wheels,Walker - 4 wheels,Cane - single point   Prior Device Use: Indicate devices/aids used by the patient prior to current illness, exacerbation or injury? Walker   Current Functional Level Cognition   Overall Cognitive Status: Within Functional Limits for tasks assessed Orientation Level: Oriented X4 General Comments: Drowsy from pain medications, intermittently falling asleep    Extremity Assessment (includes Sensation/Coordination)   Upper Extremity Assessment: Generalized weakness  Lower Extremity Assessment: Defer to PT evaluation RLE Deficits / Details: s/p BKA. Grossly 2/5 strength LLE Deficits / Details: Strength grossly 4/5     ADLs   Overall ADL's : Needs assistance/impaired Grooming: Set up,Sitting Lower Body Dressing: Moderate assistance,Sit to/from stand Lower Body Dressing Details (indicate cue type and reason): Able to  doff L sock and don L shoe with set-up assist. Would likely require Mod A to thread BLE and hike LB clothing over hips in standing with assist to maintain standing balance. Toilet Transfer: Moderate  assistance,Stand-pivot Toilet Transfer Details (indicate cue type and reason): Simulated with stand-pivot transfer to recliner. General ADL Comments: Pt recently back to bed after sitting up in recliner and PT session but agreeable to OT session. completed bed mobility 3x sit<>stands EOB, pivotal steps to simunlate toilet transfer to [facility].     Mobility   Overal bed mobility: Needs Assistance Bed Mobility: Rolling (scooting up) Rolling: Supervision Supine to sit: Min guard Sit to supine: Min guard General bed mobility comments: supervision for safety and needs of pt     Transfers   Overall transfer level: Needs assistance Equipment used: Rolling walker (2 wheeled) Transfers: Sit to/from [employer] Sit to Stand: Mod assist Stand pivot transfers: Mod assist General transfer comment: declines OOB after his HD     Ambulation / Gait / Stairs / Wheelchair Mobility   Ambulation/Gait Ambulation/Gait assistance: Mod assist Gait Distance (Feet): 4 Feet (+ 10 ft) Assistive device: Rolling walker (2 wheeled) Gait Pattern/deviations: Step-to pattern,Trunk flexed,Antalgic General Gait Details: Cues for hip extension and upper trunk control this session.  Pt fatigues quickly with short bout of gt training in room.     Posture / Balance Dynamic Sitting Balance Sitting balance - Comments: Patient able to maintain sitting balance seated EOB while doffing/donning footwear. No overt LOB. Balance Overall balance assessment: Needs assistance Sitting-balance support: Bilateral upper extremity supported,Feet supported Sitting balance-Leahy Scale: Fair Sitting balance - Comments: Patient able to maintain sitting balance seated EOB while doffing/donning footwear. No overt LOB. Postural control: Posterior lean Standing balance support: Bilateral upper extremity supported,During functional activity Standing balance-Leahy Scale: Poor Standing balance comment: Heavy reliance on BUE on RW and  occasional external assist.     Special needs/care consideration  Right BKA surgical site monitoring for possible need for AKA Denise, significant other, is visitor    Previous Home Environment  Living Arrangements: Spouse/significant other (girlfriend, Veterinary surgeon)  Lives With: Significant other Available Help at Discharge: Available PRN/intermittently Type of Home: [place]: One level,Able to live on main level with bedroom/bathroom Home Access: Stairs to enter [employer] of Steps: 1 Bathroom Shower/Tub: Multimedia programmer: Standard Bathroom Accessibility: Yes How Accessible: Accessible via walker Baring: No   Discharge Living Setting Plans for Discharge Living Setting: Patient's home,Lives with (comment) (girlfriend, Denise) Type of Home at Discharge: House Discharge Home Layout: One level Discharge Home Access: Stairs to enter Entrance Stairs-Rails: None Entrance Stairs-Number of Steps: 1 Discharge Bathroom Shower/Tub: Horticulturist, commercial: Standard Discharge Bathroom Accessibility: Yes How Accessible: Accessible via walker Does the patient have any problems obtaining your medications?: No   Social/Family/Support Systems Contact Information: Langley Gauss, Significant other Anticipated Caregiver: Product manager Information: see above Caregiver Availability: Intermittent Discharge Plan Discussed with Primary Caregiver: Yes Is Caregiver In Agreement with Plan?: Yes Does Caregiver/Family have Issues with Lodging/Transportation while Pt is in Rehab?: No   Goals Patient/Family Goal for Rehab: Mod I with PT and OT Expected length of stay: ELOS 7 to 10 days Additional Information: Hemodialysis MWF Pt/Family Agrees to Admission and willing to participate: Yes Program Orientation Provided & Reviewed with Pt/Caregiver Including Roles  & Responsibilities: Yes   Decrease burden of Care through IP  rehab admission: n/a   Possible need for SNF placement upon  discharge: not anticipated   Patient Condition: I have reviewed medical records from [facility] , spoken with CM, and patient. I met with patient at the bedside for inpatient rehabilitation assessment.  Patient will benefit from ongoing PT and OT, can actively participate in 3 hours of therapy a day 5 days of the week, and can make measurable gains during the admission.  Patient will also benefit from the coordinated team approach during an Inpatient Acute Rehabilitation admission.  The patient will receive intensive therapy as well as Rehabilitation physician, nursing, social worker, and care management interventions.  Due to bladder management, bowel management, safety, skin/wound care, disease management, medication administration, pain management and patient education the patient requires 24 hour a day rehabilitation nursing.  The patient is currently min to min guard overall with mobility and basic ADLs.  Discharge setting and therapy post discharge at home with home health is anticipated.  Patient has agreed to participate in the Acute Inpatient Rehabilitation Program and will admit today.   Preadmission Screen Completed By:  Cleatrice Burke, 04/23/2020 10:46 AM ______________________________________________________________________   Discussed status with Dr. Naaman Plummer on  04/23/2020 at 1045 and received approval for admission today.   Admission Coordinator:  Cleatrice Burke, RN, time  4098 Date  04/23/2020    Assessment/Plan: Diagnosis: right bka 1. Does the need for close, 24 hr/day Medical supervision in concert with the patient's rehab needs make it unreasonable for this patient to be served in a less intensive setting? Yes 2. Co-Morbidities requiring supervision/potential complications: esrd on HD, renal cell ca, avr/afib,osa 3. Due to bladder management, bowel management, safety, skin/wound care, disease  management, medication administration, pain management and patient education, does the patient require 24 hr/day rehab nursing? Yes 4. Does the patient require coordinated care of a physician, rehab nurse, PT, OT, and SLP to address physical and functional deficits in the context of the above medical diagnosis(es)? Yes Addressing deficits in the following areas: balance, endurance, locomotion, strength, transferring, bowel/bladder control, bathing, dressing, feeding, grooming, toileting and psychosocial support 5. Can the patient actively participate in an intensive therapy program of at least 3 hrs of therapy 5 days a week? Yes 6. The potential for patient to make measurable gains while on inpatient rehab is excellent 7. Anticipated functional outcomes upon discharge from inpatient rehab: modified independent PT, modified independent OT, n/a SLP 8. Estimated rehab length of stay to reach the above functional goals is: 7-10 days 9. Anticipated discharge destination: Home 10. Overall Rehab/Functional Prognosis: excellent     MD Signature: Meredith Staggers, MD, [place] [facility] 04/23/2020           Revision History                        Note Details  Author Meredith Staggers, MD File Time 04/23/2020 10:54 AM  Author Type Physician Status Signed  Last Editor Meredith Staggers, MD Service Physical Medicine and [place] # [id number] Admit Date 04/23/2020

## 2020-04-24 NOTE — Evaluation (Signed)
Occupational Therapy Assessment and Plan  Patient Details  Name: Jeremy [name] MRN: [id number] Date of Birth: September 24, 1955  OT Diagnosis: acute pain and muscle weakness (generalized) Rehab Potential: Rehab Potential (ACUTE ONLY): Good ELOS: 7-10 days   Today's Date: 04/24/2020 OT Individual Time: 1100-1200 OT Individual Time Calculation (min): 60 min     Hospital Problem: Active Problems:   Below-knee amputation of left lower extremity (HCC)   Anemia of chronic disease   Macrocytosis   ESRD on dialysis ([place])   Supratherapeutic INR   Past Medical History:  Past Medical History:  Diagnosis Date  . Abnormal stress test 03/19/2018  . 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 Aortic stenosis    a. 2014 s/p mech AVR ([facility])-->chronic coumadin; b. 08/2018 Echo: Mild AS/AI. Nl fxn/gradient.  . Arthritis   . Atrial fibrillation ([place])   . CAD (coronary artery disease)    a. 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 02/2018 MV ([facility]: large inf, infsept, inflat infarct w/ min rev, EF 30%; c. 06/2018 Cath: LM min irregs, LAD 60p, 95/30/68m, LCX min irregs, RCA 40p, 755m90d, RPAV 90, RPDA 95ost; d. 09/2018 CABGx3 (LIMA->LAD, VG->D1, VG->PDA).  . Chronic anticoagulation 06/2012   a. Coumadin in the setting of mech AVR and afib.  . Marland KitchenOVID-19 03/15/2019  . CPAP (continuous positive airway pressure) dependence   [facility]   Patient started HD around 1996,  had 1st transplant LLQ around 1998 until 2007 when they found renal cell cancer in transplant and both native kidneys, all were removed > went back on HD until 2nd transplant RLQ in 2014 which lasted 3 yrs; it was embolized for suspected acute rejection. [place] [facility] [place] [place]  . ESRD (end stage renal disease) ([place]   a. 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 1999 s/p Kidney transplant; c. 2014 Bilat nephrectomy  (transplanted kidney resected) in the setting of renal cell carcinoma; d. PD until 11/2017-->HD.  . Marland Kitcheneart murmur   . HFrEF (heart failure with reduced ejection fraction) ([facility]   a. 07/2016 Echo: EF 55-60%; b. 02/2018 MV: EF 30%; c. 04/2018 Echo: EF 35%; d. 08/2018 Echo: EF 30-35%. Glob HK w/o rwma. Mildly reduced RV fxn. Mod to sev dil LA. Nl AoV fxn.  . Marland Kitchenx of colonoscopy    2009 by Dr. ReLaural GoldenNormal except for small submucosal lipoma. No evidence of polyps or colitis.  . Hyperlipidemia   . Hypertension   . Hypogonadism in male 07/25/2015  . Ischemic cardiomyopathy    a. 07/2016 Echo: EF 55-60%; b. 02/2018 MV: EF 30%; c. 04/2018 Echo: EF 35%; d. 08/2018 Echo: EF 30-35%.  . OSA (obstructive sleep apnea)    No CPAP  . Permanent atrial fibrillation (HCC)    a. CHA2DS2VASc = 3-->Coumadin (also mech AVR); b. 06/2018 Amio started 2/2 elevated rates.  . [place]  . Ren[place] cell carcinoma [place]2018   Past Surgical History:  Past Surgical History:  Procedure Laterality Date  . ABDOMINAL AORTOGRAM W/LOWER EXTREMITY N/A 12/22/2019   Procedure: ABDOMINAL AORTOGRAM W/LOWER EXTREMITY;  Surgeon: ClMarty HeckMD;  [facility];  Service: Cardiovascular;  Laterality: N/A;  . AMPUTATION Right 04/14/2020   Procedure: RIGHT BELOW KNEE AMPUTATION;  Surgeon: ClMarty HeckMD;  Location: [facility] Service: Vascular;  Laterality:  Right;  Marland Kitchen AORTIC VALVE REPLACEMENT  3 /11 /2014   At [place]    . CAPD INSERTION N/A 02/19/2017   Procedure: LAPAROSCOPIC INSERTION CONTINUOUS AMBULATORY PERITONEAL DIALYSIS  (CAPD) CATHETER;  Surgeon: Algernon Huxley, MD;  Location: [facility];  Service: General;  Laterality: N/A;  . CORONARY ANGIOPLASTY WITH STENT PLACEMENT    . CORONARY ARTERY BYPASS GRAFT N/A 09/16/2018   Procedure: CORONARY ARTERY BYPASS GRAFT times three      with left internal mammary artery and endoharvest of right leg greater  saphenous vein;  Surgeon: Melrose Nakayama, MD;  Location: [place];  Service: Open Heart Surgery;  Laterality: N/A;  . CORONARY STENT INTERVENTION N/A 07/15/2019   Procedure: CORONARY STENT INTERVENTION;  Surgeon: Wellington Hampshire, MD;  Location: [place] CV LAB;  Service: Cardiovascular;  Laterality: N/A;  . DG AV DIALYSIS  SHUNT ACCESS EXIST*L* OR     left arm  . EPIGASTRIC HERNIA REPAIR N/A 02/19/2017   Procedure: HERNIA REPAIR EPIGASTRIC ADULT;  Surgeon: Robert Bellow, MD;  Location: [facility];  Service: General;  Laterality: N/A;  . EYE SURGERY     bilateral cataract  . HERNIA REPAIR     UMBILICAL HERNIA  . HERNIA REPAIR  02/19/2017  . INNER EAR SURGERY     20 years ago  . KIDNEY TRANSPLANT    . LEFT HEART CATH AND CORS/GRAFTS ANGIOGRAPHY N/A 07/15/2019   Procedure: LEFT HEART CATH AND CORS/GRAFTS ANGIOGRAPHY;  Surgeon: Wellington Hampshire, MD;  Location: [place] CV LAB;  Service: Cardiovascular;  Laterality: N/A;  . LOWER EXTREMITY ANGIOGRAPHY Right 08/25/2019   Procedure: LOWER EXTREMITY ANGIOGRAPHY;  Surgeon: Algernon Huxley, MD;  Location: [place] CV LAB;  Service: Cardiovascular;  Laterality: Right;  . LOWER EXTREMITY ANGIOGRAPHY Left 09/29/2019   Procedure: Lower Extremity Angiography;  Surgeon: Algernon Huxley, MD;  Location: [place] CV LAB;  Service: Cardiovascular;  Laterality: Left;  . PERIPHERAL VASCULAR INTERVENTION Right 12/22/2019   Procedure: PERIPHERAL VASCULAR INTERVENTION;  Surgeon: Marty Heck, MD;  Location: [place] CV LAB;  Service: Cardiovascular;  Laterality: Right;  . Peritoneal Dialysis access placement  02/19/2017  . PSEUDOANERYSM COMPRESSION N/A 09/29/2019   Procedure: PSEUDOANERYSM COMPRESSION;  Surgeon: Algernon Huxley, MD;  Location: [place] CV LAB;  Service: Cardiovascular;  Laterality: N/A;  . REMOVAL OF A DIALYSIS CATHETER N/A 11/23/2017   Procedure: REMOVAL OF A PERITONEAL DIALYSIS CATHETER;  Surgeon: Algernon Huxley, MD;   Location: [facility];  Service: Vascular;  Laterality: N/A;  . RIGHT/LEFT HEART CATH AND CORONARY ANGIOGRAPHY Bilateral 06/07/2018   Procedure: RIGHT/LEFT HEART CATH AND CORONARY ANGIOGRAPHY;  Surgeon: Wellington Hampshire, MD;  Location: [place] CV LAB;  Service: Cardiovascular;  Laterality: Bilateral;  . TEE WITHOUT CARDIOVERSION N/A 07/21/2016   Procedure: TRANSESOPHAGEAL ECHOCARDIOGRAM (TEE);  Surgeon: Wellington Hampshire, MD;  Location: [facility];  Service: Cardiovascular;  Laterality: N/A;  . TEE WITHOUT CARDIOVERSION N/A 09/16/2018   Procedure: TRANSESOPHAGEAL ECHOCARDIOGRAM (TEE);  Surgeon: Melrose Nakayama, MD;  Location: [place];  Service: Open Heart Surgery;  Laterality: N/A;    Assessment & Plan Clinical Impression: Jeremy [name] is a 65 year old male with history of renal cell CA s/p failed transplant--HD-MWF , mechanical AVR/A fib-on coumadin, OSA, PVDwith multiple stents LLE andright foot ulcer s/p angio and stenting in attempts at limb salvage. He was admitted on 04/12/20 with critical limb ischemia with right  foot gangrene and BKA recommended. 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 Carlis Abbott. Post op course significant for A fib with RVR requiring IV BB, poor pain control as well as fevers on 01/11-->. BC X 2 negative. CXR showed question of LLL cavitary mass but CT chest negative. Coumadin being managed with pharmacy but INR has been labile --received vitamin K for INR up to 4.7--INR back up to 5.4 today. Duskiness around R-BKA stump being monitored due to concerns of needing revision but felt to be due to anticoagulation.   Patient transferred to CIR on 04/23/2020 .    Patient currently requires supervision with basic self-care skills at bed level secondary to muscle weakness, decreased cardiorespiratoy endurance, decreased coordination, decreased problem solving and decreased sitting balance, decreased standing balance and decreased balance strategies.  Prior to  hospitalization, patient could complete ADLs/IADLs with modified independent .  Patient will benefit from skilled intervention to decrease level of assist with basic self-care skills prior to discharge home with care partner.  Anticipate patient will require intermittent supervision and follow up home health.  OT - End of Session Activity Tolerance: Tolerates 30+ min activity with multiple rests Endurance Deficit: Yes Endurance Deficit Description: Requires intermittent rest breaks during bed level self care OT Assessment OT Patient demonstrates impairments in the following area(s): Balance;Safety;Cognition;Sensory;Skin Integrity;Endurance;Motor;Pain OT Basic ADL's Functional Problem(s): Grooming;Bathing;Dressing;Toileting OT Transfers Functional Problem(s): Toilet OT Additional Impairment(s): None OT Plan OT Intensity: Minimum of 1-2 x/day, 45 to 90 minutes OT Frequency: 5 out of 7 days OT Duration/Estimated Length of Stay: 7-10 days OT Treatment/Interventions: Balance/vestibular training;Discharge planning;Functional electrical stimulation;Pain management;Self Care/advanced ADL retraining;Therapeutic Activities;UE/LE Coordination activities;Cognitive remediation/compensation;Disease mangement/prevention;Functional mobility training;Patient/family education;Skin care/wound managment;Therapeutic Exercise;DME/adaptive equipment instruction;Neuromuscular re-education;Psychosocial support;UE/LE Strength taining/ROM;Wheelchair propulsion/positioning OT Basic Self-Care Anticipated Outcome(s): mod I OT Toileting Anticipated Outcome(s): mod I OT Bathroom Transfers Anticipated Outcome(s): mod I OT Recommendation Patient destination: Home Follow Up Recommendations: Home health OT Equipment Recommended: To be determined   OT Evaluation Precautions/Restrictions  Precautions Precautions: Fall Precaution Comments: R BKA Restrictions Weight Bearing Restrictions: Yes RLE Weight Bearing: Non weight  bearing General Chart Reviewed: Yes Pain Pain Assessment Pain Scale: 0-10 Pain Score: 0-No pain Pain Location: Leg Pain Orientation: Right Pain Descriptors / Indicators: Aching;Burning Pain Frequency: Intermittent Pain Onset: With Activity Patients Stated Pain Goal: 4 Pain Intervention(s): Medication (See eMAR) (percocet given prior to transport to HD) Multiple Pain Sites: No Home Living/Prior [place] expects to be discharged to:: Private residence Living Arrangements: Spouse/significant other Available Help at Discharge: Available PRN/intermittently,Other (Comment) Type of Home: House Home Access: Stairs to enter [employer] of Steps: 2 Entrance Stairs-Rails: None Home Layout: Able to live on main level with bedroom/bathroom,Two level,Laundry or work area in basement Alternate Level Stairs-Number of Steps: Full flight of steps to basement Bathroom Shower/Tub: Multimedia programmer: Standard Bathroom Accessibility: Yes  Lives With: Significant other,Other (Comment) IADL History Homemaking Responsibilities: Yes Meal Prep Responsibility: No Laundry Responsibility: Secondary Cleaning Responsibility: Secondary Prior Function Level of Independence: Independent with basic ADLs,Independent with transfers,Requires assistive device for independence,Independent with homemaking with ambulation  Able to Take Stairs?: Yes Driving: Yes Vocation: On disability Vocation Requirements: On disability since 2017 Vision Baseline Vision/History: Wears glasses Wears Glasses: Reading only Patient Visual Report: No change from baseline Vision Assessment?: No apparent visual deficits Perception  Perception: Within Functional Limits Praxis Praxis: Intact Cognition Overall Cognitive Status: Within Functional Limits for tasks assessed Arousal/Alertness: Awake/alert Orientation Level: Person;Place;Situation Person: Oriented Place:  Oriented Situation: Oriented Year: 2022 Month: January Day of Week: Correct Memory: Appears intact Immediate Memory Recall: Bed;Blue;Sock Memory Recall Sock: Without Cue Memory Recall Blue: Without Cue Memory Recall Bed: With Cue Attention: Sustained Sustained Attention: Appears intact Selective Attention: Appears intact Awareness: Appears intact Problem Solving: Impaired Problem Solving Impairment: Functional basic Safety/Judgment: Appears intact Sensation Sensation Light Touch: Impaired by gross assessment Light Touch Impaired Details: Impaired LLE;Impaired RLE Hot/Cold: Appears Intact Proprioception: Impaired Detail Proprioception Impaired Details: Impaired LLE Stereognosis: Not tested Coordination Gross Motor Movements are Fluid and Coordinated: No Fine Motor Movements are Fluid and Coordinated: Yes Coordination and Movement Description: Effortful due to debility and general deconditioning as well as s/p R BKA impacting movement patterns Finger Nose Finger Test: WNL BUE Motor  Motor Motor: Other (comment) Motor - Skilled Clinical Observations: R BKA with balance strategies deficits  Trunk/Postural Assessment  Cervical Assessment Cervical Assessment: Within Functional Limits Thoracic Assessment Thoracic Assessment: Within Functional Limits Lumbar Assessment Lumbar Assessment: Within Functional Limits Postural Control Postural Control: Within Functional Limits  Balance Balance Balance Assessed: Yes Static Sitting Balance Static Sitting - Balance Support: No upper extremity supported Static Sitting - Level of Assistance: 5: Stand by assistance Dynamic Sitting Balance Dynamic Sitting - Balance Support: During functional activity Dynamic Sitting - Level of Assistance: 5: Stand by assistance Static Standing Balance Static Standing - Balance Support: Bilateral upper extremity supported Static Standing - Level of Assistance: 4: Min assist Dynamic Standing  Balance Dynamic Standing - Balance Support: Bilateral upper extremity supported;During functional activity Dynamic Standing - Level of Assistance: 4: Min assist Extremity/Trunk Assessment RUE Assessment RUE Assessment: Exceptions to [facility] General Strength Comments: 4-/5 LUE Assessment LUE Assessment: Exceptions to [facility] General Strength Comments: 4-/5  Care Tool Care Tool Self Care Eating   Eating Assist Level: Independent    Oral Care    Oral Care Assist Level: Set up assist    Bathing         Assist Level: Supervision/Verbal cueing (at bed level per md orders)    Upper Body Dressing(including orthotics)   What is the patient wearing?: Pull over shirt   Assist Level: Set up assist    Lower Body Dressing (excluding footwear)   What is the patient wearing?: Underwear/pull up;Pants Assist for lower body dressing: Minimal Assistance - Patient > 75%    Putting on/Taking off footwear   What is the patient wearing?: Non-skid slipper socks Assist for footwear: Supervision/Verbal cueing       Care Tool Toileting Toileting activity         Care Tool Bed Mobility Roll left and right activity   Roll left and right assist level: Supervision/Verbal cueing    Sit to lying activity   Sit to lying assist level: Supervision/Verbal cueing    Lying to sitting edge of bed activity   Lying to sitting edge of bed assist level: Supervision/Verbal cueing     Care Tool Transfers Sit to stand transfer   Sit to stand assist level: Minimal Assistance - Patient > 75%    Chair/bed transfer   Chair/bed transfer assist level: Moderate Assistance - Patient 50 - 74%     Toilet transfer         Care Tool Cognition Expression of Ideas and Wants Expression of Ideas and Wants: Without difficulty (complex and basic) - expresses complex messages without difficulty and with speech that is clear and easy to understand   Understanding Verbal and Non-Verbal Content Understanding Verbal and  Non-Verbal Content:  Understands (complex and basic) - clear comprehension without cues or repetitions   Memory/Recall Ability *first 3 days only Memory/Recall Ability *first 3 days only: Current season;Location of own room;Staff names and faces;That he or she is in a hospital/hospital unit    Refer to Care Plan for [place] 1 OT Short Term Goal 1 (Week 1): STGs=LTGs due to ELOS  Recommendations for other services: None    Skilled Therapeutic Intervention ADL ADL Grooming: Setup Where Assessed-Grooming: Bed level Upper Body Bathing: Supervision/safety Where Assessed-Upper Body Bathing: Bed level Lower Body Bathing: Supervision/safety Where Assessed-Lower Body Bathing: Bed level Upper Body Dressing: Setup Where Assessed-Upper Body Dressing: Bed level Lower Body Dressing: Supervision/safety Where Assessed-Lower Body Dressing: Bed level Mobility  Bed Mobility Bed Mobility: Rolling Right;Rolling Left;Supine to Sit;Sit to Supine Rolling Right: Supervision/verbal cueing Rolling Left: Supervision/Verbal cueing Supine to Sit: Supervision/Verbal cueing Sit to Supine: Supervision/Verbal cueing Transfers Sit to Stand: Moderate Assistance - Patient 50-74% Stand to Sit: Moderate Assistance - Patient 50-74%   Skilled Intervention:  Pt supine in bed, agreeable to OT session.  Per MD, bed level therapy only today.  Initial evaluation completed, educated pt on OT scope of practice, and collaborated with pt regarding OT POC.  Pt completed self care and bed mobility per above levels of assist required. Call bell in reach, bed alarm on.   Discharge Criteria: Patient will be discharged from OT if patient refuses treatment 3 consecutive times without medical reason, if treatment goals not met, if there is a change in medical status, if patient makes no progress towards goals or if patient is discharged from hospital.  The above assessment, treatment plan, treatment  alternatives and goals were discussed and mutually agreed upon: by patient  Ezekiel Slocumb 04/24/2020, 12:57 PM

## 2020-04-25 ENCOUNTER — Inpatient Hospital Stay (HOSPITAL_COMMUNITY): Payer: Medicare Other

## 2020-04-25 ENCOUNTER — Inpatient Hospital Stay (HOSPITAL_COMMUNITY): Payer: Medicare Other | Admitting: Occupational Therapy

## 2020-04-25 LAB — PROTIME-INR
INR: 3.7 — ABNORMAL HIGH (ref 0.8–1.2)
Prothrombin Time: 35.3 seconds — ABNORMAL HIGH (ref 11.4–15.2)

## 2020-04-25 NOTE — Progress Notes (Signed)
Physical Therapy Session Note  Patient Details  Name: Jeremy [name] MRN: [id number] Date of Birth: Aug 21, 1955  Today's Date: 04/25/2020 PT Individual Time: 1002-1057 PT Individual Time Calculation (min): 55 min   Short Term Goals: Week 1:  PT Short Term Goal 1 (Week 1): STG = LTG due to ELOS  Skilled Therapeutic Interventions/Progress Updates:     Pt received asleep in bed. Easily roused and agrees to therapy. No complaint of pain. Supine to sit with bed features and supervision with cues on sequencing and positioning. Squat pivot transfer to [place] with minA. WC transport to gym for time management. Pt performs strengthening and balance training in parallel bars. Sit to stand with cues on body mechanics. Pt performs 3x5 heel raises with cues on decreasing WB through upper extremities to increase resistance and balance challenge. Pt performs sit to stand block training with CGA/minA to facilitate sequencing and body mechanics. 3x5 with extended seated rest breaks. PT emphasizes importance of eccentric control for improved safety and strengthening and facilitates hip flexion and trunk flexion while performing stand>sit to improve balance and allow pt to reach back for WC to control descent.  Squat pivot to mat table with minA. Sit to supine with cues on positioning. Pt performs supine therex for strengthening and ROM of R residual limb. 1x15: Quad sets Glute sets Hip abduction SLRs Pt then performs 1x10 hip abduction in L sidelying with PT providing AAROM for optimal performance and due to weakness in hip abductors. Pt rolls to prone for passive stretch of R hip flexors and hamstrings. Pt performs 1x10 prone R leg hip extensions with PT facilitating neutral pelvic rotation. Prone>supine>sitting mod(I) for increased time. Squat pivot to WC with minA. Pt self propels WC x150' back to room independently. Pt left seated in WC with alarm intact and all needs within reach.  Therapy  Documentation Precautions:  Precautions Precautions: Fall Precaution Comments: R BKA Restrictions Weight Bearing Restrictions: Yes RLE Weight Bearing: Non weight bearing   Therapy/Group: Individual Therapy  Breck Coons, PT, DPT 04/25/2020, 12:47 PM

## 2020-04-25 NOTE — Progress Notes (Signed)
Patient ID: Jeremy [name], male   DOB: [birth year], 65 y.o.   MRN: [id number] Met with pt to discuss team conference goals of independent wheelchair and min assist with ambulance. Target discharge date of 1/27. He reports his HD was messed up yesterday and hopes to get back on his M,W, F track. He will check with girlfriend to see if can come in next week for education prior to discharging home. Will work on discharge needs.

## 2020-04-25 NOTE — Progress Notes (Signed)
Physical Therapy Session Note  Patient Details  Name: Jeremy [name] MRN: [id number] Date of Birth: 12-22-1955  Today's Date: 04/25/2020 PT Individual Time: 0900-0930 PT Individual Time Calculation (min): 30 min   Short Term Goals: Week 1:  PT Short Term Goal 1 (Week 1): STG = LTG due to ELOS  Skilled Therapeutic Interventions/Progress Updates:   Patient received supine in bed, agreeable to PT. He reports "mild pain" in R LE, premedicated. PT providing rest breaks, distractions, and repositioning to assist with pain management. He was able to come sit edge of bed with supervision and transfer to wc via lateral scoot with CGA. Supervision wc mobility to therapy gym. He was able to complete seated LAQ 3x10 and seated marches 3x10. Patient transferring to therapy mat via lateral scoot with CGA and tolerated prolonged hip flexor stretch in prone. He completed prone hamstring curls R LE 2x12. Patient transferring to wc and then to bed, per patient request, via lateral scoot with CGA. Bed alarm on, call light within reach.     Therapy Documentation Precautions:  Precautions Precautions: Fall Precaution Comments: R BKA Restrictions Weight Bearing Restrictions: Yes RLE Weight Bearing: Non weight bearing    Therapy/Group: Individual Therapy  [name], PT, DPT, CBIS  04/25/2020, 7:38 AM

## 2020-04-25 NOTE — Progress Notes (Signed)
Occupational Therapy Session Note  Patient Details  Name: Jeremy [name] MRN: [id number] Date of Birth: 07/31/1955  Today's Date: 04/25/2020 OT Individual Time: 0700-0755 OT Individual Time Calculation (min): 55 min    Short Term Goals: Week 1:  OT Short Term Goal 1 (Week 1): STGs=LTGs due to ELOS  Skilled Therapeutic Interventions/Progress Updates:     Pt received in bed. 3/10 pain stated as "good." No  INR back from lab therefore proceeded as still on bedrest. Bed level bathing and dressing completed. Educated on process to cover R LE if when getting shower in future  ADL:  Pt completes bathing with supervision using lateral leans to wash buttocks with set up. Pt completes UB dressing with set up Pt completes LB dressing with supervision using lateral leans with VC for technique and demo. Pt educated on use of lateral leans for toileting as alternative to  sit to stand from BSC Pt completes footwear with set up at EOB  Grooming completed with set up at EOB Toileting/transfer with MIN A for squat pivot transfer with use of lateral leans on toilet with S prior to toileting, s/u for posterior hygiene and MIN A for clothing management after toileting with demo of w/c push up.  Pt left at end of session in bed with exit alarm on, call light in reach and all needs met   Therapy Documentation Precautions:  Precautions Precautions: Fall Precaution Comments: R BKA Restrictions Weight Bearing Restrictions: Yes RLE Weight Bearing: Non weight bearing General:   Vital Signs: Therapy Vitals Temp: 98.1 F (36.7 C) Temp Source: Oral Pulse Rate: 73 Resp: 15 BP: (!) 113/59 Patient Position (if appropriate): Lying Oxygen Therapy SpO2: 99 % O2 Device: Room Air Pain: Pain Assessment Pain Score: Asleep ADL: ADL Grooming: Setup Where Assessed-Grooming: Bed level Upper Body Bathing: Supervision/safety Where Assessed-Upper Body Bathing: Bed level Lower Body Bathing:  Supervision/safety Where Assessed-Lower Body Bathing: Bed level Upper Body Dressing: Setup Where Assessed-Upper Body Dressing: Bed level Lower Body Dressing: Supervision/safety Where Assessed-Lower Body Dressing: Bed level Vision   Perception    Praxis   Exercises:   Other Treatments:     Therapy/Group: Individual Therapy  Stephanie M Schlosser 04/25/2020, 6:51 AM 

## 2020-04-25 NOTE — Progress Notes (Signed)
ANTICOAGULATION CONSULT NOTE - Follow Up Consult  Pharmacy Consult for Warfarin Indication: Afib + MechAVR  Labs: Recent Labs    04/23/20 0112 04/24/20 0512 04/25/20 0618  HGB 9.1* 8.1*  --   HCT 30.0* 26.5*  --   PLT 150 152  --   LABPROT 47.4* 52.6* 35.3*  INR 5.4* 6.1* 3.7*  CREATININE 9.93* 11.81*  --     Assessment: 65 yo male on warfarin for mech AVR/afib now s/p R BKA. INR was 2 on admission, down from 10.2 on 1/3. Warfarin was held for BKA but when restarted at 0.5mg  on 1/10, INR increased to 4.7. Vitamin K 1mg  PO given on 1/12 with INR down to goal 3.1. INR is extremely sensitive to 0.5- 1mg  doses.  INR 6.1 down to 3.7 after vitamin K 1mg  PO x1; still slightly supratherapeutic. Patient is eating 50-85% of meals. H/H 8.1 slight down, possibly dilution, getting HD today, plt stable, no signs of bleeding.   Warfarin PTA: 1mg  daily  Goal of Therapy:  INR goal 2.5 to 3.5 Monitor platelets per protocol : Yes    Plan:  Hold warfarin again, will consider 0.5mg  QMon/Thr starting 1/20  Follow daily PT/INR and weekly CBC    Benetta Spar, PharmD, BCPS, [facility] Clinical Pharmacist  Please check AMION for all [place] phone numbers After 10:00 PM, call [place]

## 2020-04-25 NOTE — Patient Care Conference (Signed)
Inpatient RehabilitationTeam Conference and Plan of Care Update Date: 04/25/2020   Time: 11:32 AM    Patient Name: Jeremy [name]      Medical Record Number: [id number]  Date of Birth: Nov 03, 1955 Sex: Male         Room/Bed: 4M07C/4M07C-01 Payor Info: Payor: MEDICARE / Plan: MEDICARE PART A AND B / Product Type: *No Product type* /    Admit Date/Time:  04/23/2020 11:38 PM  Primary Diagnosis: Left BKA  Hospital Problems: Active Problems:   Below-knee amputation of left lower extremity (HCC)   Anemia of chronic disease   Macrocytosis   ESRD on dialysis [facility])   Supratherapeutic INR    Expected Discharge Date: Expected Discharge Date: 05/03/20  Team Members Present: Physician leading conference: Dr. Delice Lesch Care Coodinator Present: Dorien Chihuahua, RN, BSN, CRRN;Becky Dupree, LCSW Nurse Present: Other (comment) Pauletta Browns , RN) PT Present: Ginnie Smart, PT OT Present: Leretha Pol, OT PPS Coordinator present : Gunnar Fusi, SLP     Current Status/Progress Goal Weekly Team Focus  Bowel/Bladder   continent of Bowel. Last Bm 04/24/20. Anuria  remain continent of bowel  assess q shift and PRN   Swallow/Nutrition/ Hydration             ADL's   CGA-supervision  mod I  Initial evaluation, self care training, functional mobility training   Mobility             Communication             Safety/Cognition/ Behavioral Observations            Pain   c/o pain 7 of 10  pain <3  assess pain q shift and PRN   Skin   staples to stump  remove staples, proper incision healing  assess skin q shift and prn     Discharge Planning:  Home with girlfriend who works from home and can assist if needed. Pt goes to HD M,w,F   Team Discussion: MD monitoring Hgb and INR. Continue HD. Patient on target to meet rehab goals: Yes, currently supervision for ADLs at  bed level. Min assist for transfers and supervision for wheelchair mobility.  *See Care Plan and progress notes for long  and short-term goals.   Revisions to Treatment Plan:   Teaching Needs: Transfers, toileting, medications, etc.  Current Barriers to Discharge: Home enviroment access/layout  Possible Resolutions to Barriers: Family education    Medical Summary Current Status: Impaired function and gait and R BKA secondary to RLE gangrene  Barriers to Discharge: Medical stability;Weight bearing restrictions;Wound care;Hemodialysis  Barriers to Discharge Comments: INR supratherapeutic Possible Resolutions to [employer] Focus: Therapies, follow labs - Hb,, follow HR, nephro recs, coumadin dose   Continued Need for Acute Rehabilitation Level of Care: The patient requires daily medical management by a physician with specialized training in physical medicine and rehabilitation for the following reasons: Direction of a multidisciplinary physical rehabilitation program to maximize functional independence : Yes Medical management of patient stability for increased activity during participation in an intensive rehabilitation regime.: Yes Analysis of laboratory values and/or radiology reports with any subsequent need for medication adjustment and/or medical intervention. : Yes   I attest that I was present, lead the team conference, and concur with the assessment and plan of the team.   Dorien Chihuahua B 04/25/2020, 3:30 PM

## 2020-04-25 NOTE — Progress Notes (Signed)
Nephrology Follow-Up Consult Note   Assessment/Recommendations: Jeremy [name] is a/an 65 y.o. male with a past medical history significant for ESRD on HD admitted for RLE amputation.   # ESRD: MWF schedule, outpatient unit: [facility]. 3.75hrs, 2K, 2.25Ca, Na 137, Bicarbonate 35, Dialyzer: F180.  - Tentative THS now in CIR and to optimize HD needs for all patients, get back on MWF prior to DC - HD THS: 2K/2Ca, 3h, 2L UF, no heparin  # Volume/ hypertension: trend post weights with HD  # Anemia of Chronic Kidney Disease: Currently receiving Epo 8000 units 3x weekly as an outpatient. Aranesp 72mcg 1/10, qmonday - increased dose to 100 mcg due today  # Secondary Hyperparathyroidism/Hyperphosphatemia: Home calcitriol 41mcg MWF  # Vascular access: L FA AVF with no issues  # RLE ischemia. 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  Pain control and dressing changes per primary. 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 note bcx drawn given h/o mechanical aortic valve-neg. Possible cavitary appearing structure on cxr, ct chest does not reveal a cavitary lesion.  _____________________________________________________  CC: ESRD  Interval History/Subjective:   No issues with HD yesterday 1.3L UF  No c/o this AM  Medications:  Current Facility-Administered Medications  Medication Dose Route Frequency Provider Last Rate Last Admin  . (feeding supplement) PROSource Plus liquid 30 mL  30 mL Oral BID BM Love, Pamela S, PA-C   30 mL at 04/25/20 1106  . acetaminophen (TYLENOL) tablet 500 mg  500 mg Oral Q4H PRN Bary Leriche, PA-C   500 mg at 04/25/20 0215  . allopurinol (ZYLOPRIM) tablet 100 mg  100 mg Oral Daily Bary Leriche, PA-C   100 mg at 04/25/20 1761  . aluminum hydroxide (AMPHOJEL/ALTERNAGEL) suspension 10 mL  10 mL Oral Q6H PRN Love, Pamela S, PA-C      . bisacodyl (DULCOLAX) suppository 10 mg  10  mg Rectal Daily PRN Love, Pamela S, PA-C      . calcitRIOL (ROCALTROL) capsule 1 mcg  1 mcg Oral Q M,W,F Love, Ivan Anchors, PA-C   1 mcg at 04/25/20 0835  . chlorhexidine (PERIDEX) 0.12 % solution 15 mL  15 mL Mouth Rinse BID Jamse Arn, MD   15 mL at 04/25/20 0833  . clopidogrel (PLAVIX) tablet 75 mg  75 mg Oral Daily Bary Leriche, PA-C   75 mg at 04/25/20 6073  . Darbepoetin Alfa (ARANESP) injection 100 mcg  100 mcg Intravenous Q Tue-HD Pearson Grippe B, MD   100 mcg at 04/24/20 1651  . diphenhydrAMINE (BENADRYL) 12.5 MG/5ML elixir 12.5-25 mg  12.5-25 mg Oral Q6H PRN Love, Pamela S, PA-C      . ezetimibe (ZETIA) tablet 10 mg  10 mg Oral Daily Bary Leriche, PA-C   10 mg at 04/25/20 0837  . guaiFENesin-dextromethorphan (ROBITUSSIN DM) 100-10 MG/5ML syrup 5-10 mL  5-10 mL Oral Q6H PRN Love, Pamela S, PA-C      . hydroxychloroquine (PLAQUENIL) tablet 400 mg  400 mg Oral QHS Love, Pamela S, PA-C   400 mg at 04/24/20 2115  . lamoTRIgine (LAMICTAL) tablet 50 mg  50 mg Oral BID Bary Leriche, PA-C   50 mg at 04/25/20 7106  . MEDLINE mouth rinse  15 mL Mouth Rinse q12n4p Jamse Arn, MD   15 mL at 04/25/20 1244  . methocarbamol (ROBAXIN) tablet 500 mg  500  mg Oral TID Bary Leriche, PA-C   500 mg at 04/25/20 9357  . metoprolol tartrate (LOPRESSOR) tablet 75 mg  75 mg Oral BID Bary Leriche, PA-C   75 mg at 04/25/20 0834  . milk and molasses enema  1 enema Rectal Daily PRN Love, Pamela S, PA-C      . oxyCODONE-acetaminophen (PERCOCET/ROXICET) 5-325 MG per tablet 1-2 tablet  1-2 tablet Oral Q4H PRN Bary Leriche, PA-C   2 tablet at 04/25/20 1240  . pantoprazole (PROTONIX) EC tablet 40 mg  40 mg Oral Daily Bary Leriche, PA-C   40 mg at 04/25/20 0177  . polyethylene glycol (MIRALAX / GLYCOLAX) packet 17 g  17 g Oral Daily Bary Leriche, PA-C   17 g at 04/25/20 9390  . polyethylene glycol (MIRALAX / GLYCOLAX) packet 17 g  17 g Oral Daily PRN Love, Pamela S, PA-C      . predniSONE  (DELTASONE) tablet 5 mg  5 mg Oral Daily Love, Ivan Anchors, PA-C   5 mg at 04/25/20 0836  . prochlorperazine (COMPAZINE) tablet 5-10 mg  5-10 mg Oral Q6H PRN Love, Pamela S, PA-C       Or  . prochlorperazine (COMPAZINE) injection 5-10 mg  5-10 mg Intramuscular Q6H PRN Love, Pamela S, PA-C       Or  . prochlorperazine (COMPAZINE) suppository 12.5 mg  12.5 mg Rectal Q6H PRN Love, Pamela S, PA-C      . rosuvastatin (CRESTOR) tablet 40 mg  40 mg Oral q1800 Bary Leriche, PA-C   40 mg at 04/24/20 1833  . sevelamer carbonate (RENVELA) tablet 3,200 mg  3,200 mg Oral TID WC Love, Ivan Anchors, PA-C   3,200 mg at 04/25/20 1241  . simethicone (MYLICON) chewable tablet 80 mg  80 mg Oral QID PRN Love, Pamela S, PA-C      . traZODone (DESYREL) tablet 25 mg  25 mg Oral QHS Bary Leriche, PA-C   25 mg at 04/24/20 2115  . Warfarin - Pharmacist Dosing Inpatient   Does not apply [id number] Flora Lipps         Physical Exam: Vitals:   04/25/20 0759 04/25/20 1211  BP: 129/61 129/83  Pulse: 85 (!) 110  Resp: 14 20  Temp: 98.2 F (36.8 C) 98.1 F (36.7 C)  SpO2: 100% 100%   Total I/O In: 417 [P.O.:417] Out: -   Intake/Output Summary (Last 24 hours) at 04/25/2020 1441 Last data filed at 04/25/2020 1300 Gross per 24 hour  Intake 417 ml  Output 1352 ml  Net -935 ml   Constitutional: well-appearing, no acute distress CV: S1S2 no rub Respiratory: clear and unlabored Gastrointestinal: soft, non-tender, nondistended Ext: RLE absent below the knee, lue avf +b/t neuro: alert and oriented x3 conversant and follows commands Psych normal mood and affect   Test Results I personally reviewed new and old clinical labs and radiology tests Lab Results  Component Value Date   NA 135 04/24/2020   K 5.0 04/24/2020   CL 93 (L) 04/24/2020   CO2 25 04/24/2020   BUN 59 (H) 04/24/2020   CREATININE 11.81 (H) 04/24/2020   GLU 98 04/14/2019   CALCIUM 9.9 04/24/2020   ALBUMIN 2.5 (L) 04/24/2020   PHOS 5.8 (H)  04/24/2020    Rexene Agent, MD 04/25/2020  2:41 PM

## 2020-04-25 NOTE — Progress Notes (Signed)
Occupational Therapy Session Note  Patient Details  Name: Jeremy [name] MRN: [id number] Date of Birth: [birth year]  Today's Date: 04/25/2020 OT Individual Time: 1140-1208 OT Individual Time Calculation (min): 28 min    Short Term Goals: Week 1:  OT Short Term Goal 1 (Week 1): STGs=LTGs due to ELOS  Skilled Therapeutic Interventions/Progress Updates:    Pt sitting up in w/c, reports "soreness" right knee, agreeable to OT session.  Pt self propelled with mod I to ADL bathroom.  Educated pt regarding safe tub bench transfer and discussed pts bathroom layout at home for accurate simulation.  Pt completed w/c<>tub bench transfer at tubshower level with CGA. Educated pt on use of portable shower head and procurement options for both tub bench and shower head.  Pt self propelled back to room.  Educated pt on doffing bilateral leg rests technique.  Pt completed squat pivot w/c to EOB with CGA.  Sit to supine with mod I.  Call bell in reach, bed alarm on.  Therapy Documentation Precautions:  Precautions Precautions: Fall Precaution Comments: R BKA Restrictions Weight Bearing Restrictions: Yes RLE Weight Bearing: Non weight bearing   Therapy/Group: Individual Therapy  Ezekiel Slocumb 04/25/2020, 1:42 PM

## 2020-04-25 NOTE — Progress Notes (Addendum)
[name]  Subjective/Complaints: Patient seen laying in bed this AM.  He states he slept well overnight.  He states he had a good first day of therapies yesterday.  He notes he was able to get HD yesterday as well. He was seen by Nephro yesterday, notes reviewed - Aranesp increased.   ROS: Denies CP, SOB, N/V/D  Objective: Vital Signs: Blood pressure 129/61, pulse 85, temperature 98.2 F (36.8 C), resp. rate 14, height 5\' 10"  (1.778 m), weight 70.7 kg, SpO2 100 %. No results found. Recent Labs    04/23/20 0112 04/24/20 0512  WBC 5.7 5.5  HGB 9.1* 8.1*  HCT 30.0* 26.5*  PLT 150 152   Recent Labs    04/23/20 0112 04/24/20 0512  NA 138 135  K 4.2 5.0  CL 95* 93*  CO2 27 25  GLUCOSE 101* 86  BUN 45* 59*  CREATININE 9.93* 11.81*  CALCIUM 10.3 9.9    Intake/Output Summary (Last 24 hours) at 04/25/2020 0905 Last data filed at 04/25/2020 0836 Gross per 24 hour  Intake 360 ml  Output 1352 ml  Net -992 ml        Physical Exam: BP 129/61 (BP Location: Right Arm)   Pulse 85   Temp 98.2 F (36.8 C)   Resp 14   Ht 5\' 10"  (1.778 m)   Wt 70.7 kg   SpO2 100%   BMI 22.36 kg/m  Constitutional: No distress . Vital signs reviewed. HENT: Normocephalic.  Atraumatic. Eyes: EOMI. No discharge. Cardiovascular: No JVD.  Irregularly irregular.  +Murmur.  Respiratory: Normal effort.  No stridor.  Bilateral clear to auscultation. GI: Non-distended.  BS +. Skin: Warm and dry.   Right BKA with dressing CDI Psych: Normal mood.  Normal behavior. Musc: No edema in extremities.  No tenderness in extremities. Neuro: Alert Motor: Bilateral upper extremities: 5/5 proximal distal Left lower extremity: 5/5 proximal distal, unchanged Right lower extremity: Hip flexion 3+/5, knee extension 4 -/5  Assessment/Plan: 1. Functional deficits which require 3+ hours per day of interdisciplinary therapy in a comprehensive inpatient rehab  setting.  Physiatrist is providing close team supervision and 24 hour management of active medical problems listed below.  Physiatrist and rehab team continue to assess barriers to discharge/monitor patient progress toward functional and medical goals   Care Tool:  Bathing              Bathing assist Assist Level: Supervision/Verbal cueing (at bed level per md orders)     Upper Body Dressing/Undressing Upper body dressing   What is the patient wearing?: Hospital gown only    Upper body assist Assist Level: Set up assist    Lower Body Dressing/Undressing Lower body dressing      What is the patient wearing?: Underwear/pull up     Lower body assist Assist for lower body dressing: Minimal Assistance - Patient > 75%     Toileting Toileting    Toileting assist       Transfers Chair/bed transfer  Transfers assist     Chair/bed transfer assist level: Moderate Assistance - Patient 50 - 74%     Locomotion Ambulation   Ambulation assist   Ambulation activity did not occur: Safety/medical concerns          Walk 10 feet activity   Assist  Walk 10 feet activity did not occur: Safety/medical concerns        Walk 50 feet activity   Assist Walk 50 feet  with 2 turns activity did not occur: Safety/medical concerns         Walk 150 feet activity   Assist Walk 150 feet activity did not occur: Safety/medical concerns         Walk 10 feet on uneven surface  activity   Assist Walk 10 feet on uneven surfaces activity did not occur: Safety/medical concerns         Wheelchair     Assist Will patient use wheelchair at discharge?: Yes Type of Wheelchair: Manual    Wheelchair assist level: Supervision/Verbal cueing Max wheelchair distance: 200FT    Wheelchair 50 feet with 2 turns activity    Assist        Assist Level: Supervision/Verbal cueing   Wheelchair 150 feet activity     Assist      Assist Level:  Supervision/Verbal cueing    Medical Problem List and Plan: 1.Impaired function and gait and R BKAsecondary to RLE gangrene  Continue CIR  Team conference today to discuss current and goals and coordination of care, home and environmental barriers, and discharge planning with nursing, case manager, and therapies. Please see conference note from today as well.  2.Mechanical AVR/Antithrombotics: -DVT/anticoagulation:Pharmaceutical:Coumadin (goal 2.5-3.5)  INR supratherapeutic on 1/19 -antiplatelet therapy: N/A 3. Pain Management:Continue robaxin 500 mg tid and oxycodone prnis effective.  Controlled on 1/19  Monitor with increased exertion 4. Mood:LCSW to follow for evaluation and support. -antipsychotic agents: N/A 5. Neuropsych: This patientiscapable of making decisions on hisown behalf. 6. Skin/Wound Care:Monitor wound for healing. [employer] to help promote wound healing. 7. Fluids/Electrolytes/Nutrition:Strict I/Os. Renal diet with 1200 cc /FR. 8. Renal cancer s/p B-nephrectomy/ESRD:  Schedule HD- usually M/W/F-at the end of the day to help with tolerance of therapy.    Renevala with meals to manage metabolic bone disease.  9. A fib: Monitor HR tid- on coumadin.   Continue Metoprolol bid.   Relatively controlled on 1/19  Monitor with increased activity 10. Anemia of chronic disease/macrocytosis: On aranesp every Monday for supplementation. Monitor for signs of bleeding.   Hemoglobin 8.1 on 1/18, labs with HD  Vitamin B12/folate ordered with next of labs, not drawn yesterday 11. GERD: continue Protonix with simethicone prn.  12. CAD s/p CABG: On Crestor and metoprolol. Monitor for symptoms with activity.  Await plans to resume Plavix 13. OSA: Continue CPAP 14.  RA  Plaquenil and prednisone resumed  LOS: 2 days A FACE TO FACE EVALUATION WAS PERFORMED  Jeremy [name] Jeremy [name] 04/25/2020, 9:05 AM

## 2020-04-26 ENCOUNTER — Inpatient Hospital Stay (HOSPITAL_COMMUNITY): Payer: Medicare Other

## 2020-04-26 ENCOUNTER — Inpatient Hospital Stay (HOSPITAL_COMMUNITY): Payer: Medicare Other | Admitting: Occupational Therapy

## 2020-04-26 ENCOUNTER — Ambulatory Visit: Payer: Medicare Other | Admitting: Internal Medicine

## 2020-04-26 DIAGNOSIS — R509 Fever, unspecified: Secondary | ICD-10-CM

## 2020-04-26 LAB — RENAL FUNCTION PANEL
Albumin: 2.8 g/dL — ABNORMAL LOW (ref 3.5–5.0)
Anion gap: 17 — ABNORMAL HIGH (ref 5–15)
BUN: 59 mg/dL — ABNORMAL HIGH (ref 8–23)
CO2: 24 mmol/L (ref 22–32)
Calcium: 10.1 mg/dL (ref 8.9–10.3)
Chloride: 91 mmol/L — ABNORMAL LOW (ref 98–111)
Creatinine, Ser: 9.64 mg/dL — ABNORMAL HIGH (ref 0.61–1.24)
GFR, Estimated: 6 mL/min — ABNORMAL LOW (ref >60)
Glucose, Bld: 125 mg/dL — ABNORMAL HIGH (ref 70–99)
Phosphorus: 5.7 mg/dL — ABNORMAL HIGH (ref 2.5–4.6)
Potassium: 4.8 mmol/L (ref 3.5–5.1)
Sodium: 132 mmol/L — ABNORMAL LOW (ref 135–145)

## 2020-04-26 LAB — PROTIME-INR
INR: 3.5 — ABNORMAL HIGH (ref 0.8–1.2)
Prothrombin Time: 34.1 seconds — ABNORMAL HIGH (ref 11.4–15.2)

## 2020-04-26 LAB — SARS CORONAVIRUS 2 (TAT 6-24 HRS): SARS Coronavirus 2: NEGATIVE

## 2020-04-26 NOTE — Progress Notes (Signed)
Orthopedic Tech Progress Note Patient Details:  Jeremy [name] 10/08/1955 [id number] Ordered brace Patient ID: Jeremy [name], male   DOB: 1955/07/15, 65 y.o.   MRN: [id number]   [name] 04/26/2020, 11:34 AM

## 2020-04-26 NOTE — Progress Notes (Signed)
Jeremy [name] called and notified the pt HD tx has been moved to 04/27/2020.

## 2020-04-26 NOTE — Progress Notes (Signed)
Physical Therapy Session Note  Patient Details  Name: Jeremy [name] MRN: [id number] Date of Birth: 11-Sep-1955  Updated by RN that patient is currently a person under investigation for COVID testing. Will hold PT until results are known. Pt missed 60 minutes of skilled therapy.  Levon Boettcher P Tomeeka Plaugher PT 04/26/2020, 7:43 AM

## 2020-04-26 NOTE — IPOC Note (Signed)
Individualized overall Plan of Care [facility]) Patient Details Name: Jeremy [name] MRN: [id number] DOB: [birth year]  Admitting Diagnosis: Below-knee amputation of left lower extremity [facility])  Hospital Problems: Principal Problem:   Below-knee amputation of left lower extremity ([place]) Active Problems:   Anemia of chronic disease   Macrocytosis   ESRD on dialysis ([place])   Supratherapeutic INR   FUO (fever of unknown origin)     Functional Problem List: Nursing Pain,Safety,Medication Management,Endurance  PT Balance,Endurance,Motor,Nutrition,Pain,Safety,Sensory,Skin Integrity  OT Balance,Safety,Cognition,Sensory,Skin Integrity,Endurance,Motor,Pain  SLP    TR         Basic ADL's: OT Grooming,Bathing,Dressing,Toileting     Advanced  ADL's: OT       Transfers: PT Bed Mobility,Bed to [employer]  OT Toilet     Locomotion: PT Ambulation,Wheelchair Mobility,Stairs     Additional Impairments: OT None  SLP        TR      Anticipated Outcomes Item Anticipated Outcome  Self Feeding    Swallowing      Basic self-care  mod I  Toileting  mod I   Bathroom Transfers mod I  Bowel/Bladder  continent of both  Transfers  mod I  Locomotion  CGA 45ft with LRAD  Communication     Cognition     Pain  <=3/10  Safety/Judgment  free of injury or harm while in CIR   Therapy Plan: PT Intensity: Minimum of 1-2 x/day ,45 to 90 minutes PT Frequency: 5 out of 7 days PT Duration Estimated Length of Stay: 7-10 days OT Intensity: Minimum of 1-2 x/day, 45 to 90 minutes OT Frequency: 5 out of 7 days OT Duration/Estimated Length of Stay: 7-10 days      Team Interventions: Nursing Interventions Patient/Family Education,Pain Management,Medication Management,Discharge Planning,Skin Care/Wound Management,Psychosocial Support,Disease Management/Prevention  PT interventions Ambulation/gait training,Discharge planning,Functional mobility training,Psychosocial support,Therapeutic  Activities,Visual/perceptual remediation/compensation,Wheelchair propulsion/positioning,Therapeutic Exercise,Skin care/wound management,Neuromuscular re-education,Disease management/prevention,Balance/vestibular training,DME/adaptive equipment instruction,Pain management,Splinting/orthotics,UE/LE Strength taining/ROM,Cognitive remediation/compensation,Community reintegration,Functional electrical stimulation,Patient/family education,Stair training,UE/LE Coordination activities  OT Interventions Balance/vestibular training,Discharge planning,Functional electrical stimulation,Pain management,Self Care/advanced ADL retraining,Therapeutic Activities,UE/LE Coordination activities,Cognitive remediation/compensation,Disease mangement/prevention,Functional mobility training,Patient/family education,Skin care/wound Teacher, music re-education,Psychosocial support,UE/LE Strength taining/ROM,Wheelchair propulsion/positioning  SLP Interventions    TR Interventions    SW/CM Interventions Discharge Planning,Psychosocial Support,Patient/Family Education   Barriers to Discharge MD  Medical stability, Wound care, Hemodialysis and Weight bearing restrictions  Nursing      PT Inaccessible home environment,Decreased caregiver support,Insurance for SNF coverage,Weight bearing restrictions,Hemodialysis    OT      SLP      SW       Team Discharge Planning: Destination: PT-Home ,OT- Home , SLP-  Projected Follow-up: PT-Home health PT, OT-  Home health OT, SLP-  Projected Equipment Needs: PT-To be determined, OT- To be determined, SLP-  Equipment Details: PT-Pt owns rollator, OT-  Patient/family involved in discharge planning: PT- Patient,  OT-Patient, SLP-   MD ELOS: 6-9 days. Medical Rehab Prognosis:  Good Assessment: 65 year old male with history of renal cell CA s/p failed transplant--HD-MWF , mechanical AVR/A fib-on coumadin, OSA, PVDwith  multiple stents LLE andright foot ulcer s/p angio and stenting in attempts at limb salvage. He was admitted on 04/12/20 with critical limb ischemia with right foot gangrene and BKA recommended. 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 Carlis Abbott. Post op course significant for A fib with RVR requiring IV BB, poor pain control as well as fevers on 01/11-->. BC X 2 negative. CXR showed question of LLL cavitary mass  but CT chest negative. Coumadin being managed with pharmacy but INR has been labile --received vitamin K for INR up to 4.7--INR back up to 5.4 today. Duskiness around R-BKA stump being monitored due to concerns of needing revision but felt to be due to anticoagulation. Patient continues to be limited by R-BKA, BUE weakness as well as decreased activity tolerance.  We will set goals for mod I with PT/OT.  Due to the current state of emergency, patients may not be receiving their 3-hours of Medicare-mandated therapy.  See Team Conference Notes for weekly updates to the plan of care

## 2020-04-26 NOTE — Progress Notes (Addendum)
[facility] PHYSICAL MEDICINE & REHABILITATION PROGRESS NOTE  Subjective/Complaints: Patient seen sitting up in bed this morning with therapies.  15 mg.  He states he slept well overnight.  He states he had dialysis yesterday and is not sure about future plans regarding dialysis.  Discussed lateral stump soreness with therapies and patient.  Also discussed INR in accordance with therapies.  Reviewed vital signs with nursing, patient with fever yesterday evening and tachycardia (in atrial fibrillation).  ROS: Denies CP, SOB, N/V/D  Objective: Vital Signs: Blood pressure 120/60, pulse 61, temperature 99.4 F (37.4 C), temperature source Oral, resp. rate 20, height 5\' 10"  (1.778 m), weight 70.1 kg, SpO2 98 %. No results found. Recent Labs    04/24/20 0512  WBC 5.5  HGB 8.1*  HCT 26.5*  PLT 152   Recent Labs    04/24/20 0512  NA 135  K 5.0  CL 93*  CO2 25  GLUCOSE 86  BUN 59*  CREATININE 11.81*  CALCIUM 9.9    Intake/Output Summary (Last 24 hours) at 04/26/2020 0839 Last data filed at 04/26/2020 0818 Gross per 24 hour  Intake 417 ml  Output --  Net 417 ml        Physical Exam: BP 120/60 (BP Location: Right Arm)   Pulse 61   Temp 99.4 F (37.4 C) (Oral)   Resp 20   Ht 5\' 10"  (1.778 m)   Wt 70.1 kg   SpO2 98%   BMI 22.17 kg/m  Constitutional: No distress . Vital signs reviewed. HENT: Normocephalic.  Atraumatic. Eyes: EOMI. No discharge. Cardiovascular: No JVD.  Irregularly irregular. + Murmur. Respiratory: Normal effort.  No stridor.  Bilateral clear to auscultation. GI: Non-distended.  BS +. Skin: Warm and dry.   Right BKA with dressing CDI Psych: Normal mood.  Normal behavior. Musc: Right BKA with edema and tenderness along lateral incision Neuro: Alert Motor: Bilateral upper extremities: 5/5 proximal distal Left lower extremity: 5/5 proximal distal, unchanged Right lower extremity: Hip flexion 4--4/5, knee extension 4/5  Assessment/Plan: 1. Functional  deficits which require 3+ hours per day of interdisciplinary therapy in a comprehensive inpatient rehab setting.  Physiatrist is providing close team supervision and 24 hour management of active medical problems listed below.  Physiatrist and rehab team continue to assess barriers to discharge/monitor patient progress toward functional and medical goals   Care Tool:  Bathing              Bathing assist Assist Level: Supervision/Verbal cueing (at bed level per md orders)     Upper Body Dressing/Undressing Upper body dressing   What is the patient wearing?: Hospital gown only    Upper body assist Assist Level: Set up assist    Lower Body Dressing/Undressing Lower body dressing      What is the patient wearing?: Underwear/pull up     Lower body assist Assist for lower body dressing: Minimal Assistance - Patient > 75%     Toileting Toileting    Toileting assist Assist for toileting: Minimal Assistance - Patient > 75%     Transfers Chair/bed transfer  Transfers assist     Chair/bed transfer assist level: Minimal Assistance - Patient > 75%     Locomotion Ambulation   Ambulation assist   Ambulation activity did not occur: Safety/medical concerns          Walk 10 feet activity   Assist  Walk 10 feet activity did not occur: Safety/medical concerns  Walk 50 feet activity   Assist Walk 50 feet with 2 turns activity did not occur: Safety/medical concerns         Walk 150 feet activity   Assist Walk 150 feet activity did not occur: Safety/medical concerns         Walk 10 feet on uneven surface  activity   Assist Walk 10 feet on uneven surfaces activity did not occur: Safety/medical concerns         Wheelchair     Assist Will patient use wheelchair at discharge?: Yes Type of Wheelchair: Manual    Wheelchair assist level: Supervision/Verbal cueing Max wheelchair distance: 200FT    Wheelchair 50 feet with 2 turns  activity    Assist        Assist Level: Supervision/Verbal cueing   Wheelchair 150 feet activity     Assist      Assist Level: Supervision/Verbal cueing    Medical Problem List and Plan: 1.Impaired function and gait and 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 2.Mechanical AVR/Antithrombotics: -DVT/anticoagulation:Pharmaceutical:Coumadin (goal 2.5-3.5)  INR therapeutic on 1/20 -antiplatelet therapy: N/A 3. Pain Management:Continue robaxin 500 mg tid and oxycodone prnis effective.  Controlled on 1/20  Monitor with increased exertion 4. Mood:LCSW to follow for evaluation and support. -antipsychotic agents: N/A 5. Neuropsych: This patientiscapable of making decisions on hisown behalf. 6. Skin/Wound Care:Monitor wound for healing. Prosource to help promote wound healing. 7. Fluids/Electrolytes/Nutrition:Strict I/Os. Renal diet with 1200 cc /FR. 8. Renal cancer s/p B-nephrectomy/ESRD:  Schedule HD- usually M/W/F-at the end of the day to help with tolerance of therapy, currently Tuesday Thursday Saturday-?  Plans to transition back   [place] with meals to manage metabolic bone disease.  9. A fib: Monitor HR tid- on coumadin.   Continue Metoprolol bid.   Relatively controlled on 1/20  Monitor with increased activity 10. Anemia of chronic disease/macrocytosis: On aranesp every Monday for supplementation. Monitor for signs of bleeding.   Hemoglobin 8.1 on 1/18, labs with HD  Vitamin B12/folate ordered with next of labs, not drawn yesterday 11. GERD: continue Protonix with simethicone prn.  12. CAD s/p CABG: On Crestor and metoprolol. Monitor for symptoms with activity.  Await plans to resume Plavix 13. OSA: Continue CPAP 14.  RA  Plaquenil and prednisone resumed 15.  Fever  With?  Tachycardia  No other symptoms/low suspicion for COVID, however, will order COVID test given current  situation   LOS: 3 days A FACE TO FACE EVALUATION WAS PERFORMED  Jeremy [name] Lorie Phenix 04/26/2020, 8:39 AM

## 2020-04-26 NOTE — Progress Notes (Signed)
Occupational Therapy Session Note  Patient Details  Name: Jeremy [name] MRN: [id number] Date of Birth: 07-01-1955  Today's Date: 04/26/2020 OT Individual Time: 0730-0830 OT Individual Time Calculation (min): 60 min    Short Term Goals: Week 1:  OT Short Term Goal 1 (Week 1): STGs=LTGs due to ELOS  Skilled Therapeutic Interventions/Progress Updates:    Pt semiupright, c/o "soreness and tenderness" small area over distal lateral residual limb.  OT unwrapped bandage and assessed pts skin, noting evidence of moderate serous drainage through [place] area in which pt complaining of pain, however incision currently not draining.  Nurse and MD made aware.  Clean dressing applied and residual limb ace wrapped.  Pt requesting to use bathroom.  Supine to sit completed with mod I.  Squat pivot EOB to w/c with CGA and mod VCs for w/c mgt.  Pt propelled to bathroom and completed squat pivot w/c to 3 in 1 commode after education on transfer technique provided.  CGA required to safely complete toilet transfer. Educated pt on lateral leaning for clothing mgt and pericare in seated position and pt completed with min assist to pull pants over hips due to limited stretch and tight fit. Educated pt on benefits of looser stretchy pants to increase ease and independence.  Squat pivot 3 in1 commode to w/c with minA. Pt completed oral and face hygiene sitting sinkside with setup.  Pt up in w/c, RLE elevated, call bell in reach, seat belt alarm on.    Therapy Documentation Precautions:  Precautions Precautions: Fall Precaution Comments: R BKA Restrictions Weight Bearing Restrictions: Yes RLE Weight Bearing: Non weight bearing   Therapy/Group: Individual Therapy  Ezekiel Slocumb 04/26/2020, 8:03 AM

## 2020-04-26 NOTE — Progress Notes (Signed)
Physical Therapy Note  Patient Details  Name: Jeremy [name] MRN: [id number] Date of Birth: 11/24/1955 Today's Date: 04/26/2020    PT held till later due to waiting on Covid results, now RN reports plans to go to HD.  Missed 75 minutes skilled PT.    Reginia Naas  [place], [place]  04/26/2020, 2:31 PM

## 2020-04-26 NOTE — Progress Notes (Signed)
Nephrology Follow-Up Consult Note   Assessment/Recommendations: Jeremy [name] is a/an 65 y.o. male with a past medical history significant for ESRD on HD admitted for RLE amputation.   # ESRD: MWF schedule, outpatient unit: [facility]. 3.75hrs, 2K, 2.25Ca, Na 137, Bicarbonate 35, Dialyzer: F180.  - Tentative THS now in CIR and to optimize HD needs for all patients, get back on MWF prior to DC - HD THS: 2K/2Ca, 3h, 2L UF, no heparin  # Volume/ hypertension: trend post weights with HD  # Anemia of Chronic Kidney Disease: Currently receiving Epo 8000 units 3x weekly as an outpatient. Aranesp 34mcg 1/10, qmonday - increased dose to 100 mcg due today  # Secondary Hyperparathyroidism/Hyperphosphatemia: Home calcitriol 57mcg MWF  # Vascular access: L FA AVF with no issues  # RLE ischemia. 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  Pain control and dressing changes per primary. 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 note bcx drawn given h/o mechanical aortic valve-neg. Possible cavitary appearing structure on cxr, ct chest does not reveal a cavitary lesion.  _____________________________________________________  CC: ESRD  Interval History/Subjective:   Tested this AM for COVID, negative  HD delayed while awaiting results  No acute issues  No labs today  Medications:  Current Facility-Administered Medications  Medication Dose Route Frequency Provider Last Rate Last Admin  . (feeding supplement) PROSource Plus liquid 30 mL  30 mL Oral BID BM Love, Pamela S, PA-C   30 mL at 04/26/20 1417  . acetaminophen (TYLENOL) tablet 500 mg  500 mg Oral Q4H PRN Bary Leriche, PA-C   500 mg at 04/25/20 2135  . allopurinol (ZYLOPRIM) tablet 100 mg  100 mg Oral Daily Bary Leriche, PA-C   100 mg at 04/26/20 0725  . aluminum hydroxide (AMPHOJEL/ALTERNAGEL) suspension 10 mL  10 mL Oral Q6H PRN Love, Pamela S,  PA-C      . bisacodyl (DULCOLAX) suppository 10 mg  10 mg Rectal Daily PRN Love, Pamela S, PA-C      . calcitRIOL (ROCALTROL) capsule 1 mcg  1 mcg Oral Q M,W,F Love, Ivan Anchors, PA-C   1 mcg at 04/25/20 0835  . chlorhexidine (PERIDEX) 0.12 % solution 15 mL  15 mL Mouth Rinse BID Jamse Arn, MD   15 mL at 04/26/20 0723  . clopidogrel (PLAVIX) tablet 75 mg  75 mg Oral Daily Bary Leriche, PA-C   75 mg at 04/26/20 0725  . Darbepoetin Alfa (ARANESP) injection 100 mcg  100 mcg Intravenous Q Tue-HD Pearson Grippe B, MD   100 mcg at 04/24/20 1651  . diphenhydrAMINE (BENADRYL) 12.5 MG/5ML elixir 12.5-25 mg  12.5-25 mg Oral Q6H PRN Love, Pamela S, PA-C      . ezetimibe (ZETIA) tablet 10 mg  10 mg Oral Daily Bary Leriche, PA-C   10 mg at 04/26/20 0962  . guaiFENesin-dextromethorphan (ROBITUSSIN DM) 100-10 MG/5ML syrup 5-10 mL  5-10 mL Oral Q6H PRN Love, Pamela S, PA-C      . hydroxychloroquine (PLAQUENIL) tablet 400 mg  400 mg Oral QHS Love, Pamela S, PA-C   400 mg at 04/25/20 2135  . lamoTRIgine (LAMICTAL) tablet 50 mg  50 mg Oral BID Bary Leriche, PA-C   50 mg at 04/26/20 8366  . MEDLINE mouth rinse  15 mL Mouth Rinse q12n4p Jamse Arn, MD   15 mL at 04/25/20 1550  .  methocarbamol (ROBAXIN) tablet 500 mg  500 mg Oral TID Love, Pamela S, PA-C   500 mg at 04/26/20 0725  . metoprolol tartrate (LOPRESSOR) tablet 75 mg  75 mg Oral BID Bary Leriche, PA-C   75 mg at 04/25/20 2136  . milk and molasses enema  1 enema Rectal Daily PRN Love, Pamela S, PA-C      . oxyCODONE-acetaminophen (PERCOCET/ROXICET) 5-325 MG per tablet 1-2 tablet  1-2 tablet Oral Q4H PRN Bary Leriche, PA-C   2 tablet at 04/26/20 1443  . pantoprazole (PROTONIX) EC tablet 40 mg  40 mg Oral Daily Bary Leriche, PA-C   40 mg at 04/26/20 0725  . polyethylene glycol (MIRALAX / GLYCOLAX) packet 17 g  17 g Oral Daily Bary Leriche, PA-C   17 g at 04/26/20 1540  . polyethylene glycol (MIRALAX / GLYCOLAX) packet 17 g  17 g Oral  Daily PRN Love, Pamela S, PA-C      . predniSONE (DELTASONE) tablet 5 mg  5 mg Oral Daily Love, Pamela S, PA-C   5 mg at 04/26/20 0725  . prochlorperazine (COMPAZINE) tablet 5-10 mg  5-10 mg Oral Q6H PRN Bary Leriche, PA-C   10 mg at 04/25/20 1710   Or  . prochlorperazine (COMPAZINE) injection 5-10 mg  5-10 mg Intramuscular Q6H PRN Love, Pamela S, PA-C       Or  . prochlorperazine (COMPAZINE) suppository 12.5 mg  12.5 mg Rectal Q6H PRN Love, Pamela S, PA-C      . rosuvastatin (CRESTOR) tablet 40 mg  40 mg Oral q1800 Bary Leriche, PA-C   40 mg at 04/25/20 1705  . sevelamer carbonate (RENVELA) tablet 3,200 mg  3,200 mg Oral TID WC Love, Ivan Anchors, PA-C   3,200 mg at 04/26/20 1154  . simethicone (MYLICON) chewable tablet 80 mg  80 mg Oral QID PRN Love, Pamela S, PA-C      . traZODone (DESYREL) tablet 25 mg  25 mg Oral QHS Bary Leriche, PA-C   25 mg at 04/25/20 2136  . Warfarin - Pharmacist Dosing Inpatient   Does not apply [id number] Flora Lipps         Physical Exam: Vitals:   04/26/20 0853 04/26/20 1330  BP:  137/84  Pulse:  (!) 108  Resp:  16  Temp: 98 F (36.7 C) 98.2 F (36.8 C)  SpO2:  100%   Total I/O In: 480 [P.O.:480] Out: -   Intake/Output Summary (Last 24 hours) at 04/26/2020 1519 Last data filed at 04/26/2020 1348 Gross per 24 hour  Intake 480 ml  Output --  Net 480 ml   Constitutional: well-appearing, no acute distress CV: S1S2 no rub Respiratory: clear and unlabored Gastrointestinal: soft, non-tender, nondistended Ext: RLE absent below the knee, lue avf +b/t neuro: alert and oriented x3 conversant and follows commands Psych normal mood and affect   Test Results I personally reviewed new and old clinical labs and radiology tests Lab Results  Component Value Date   NA 135 04/24/2020   K 5.0 04/24/2020   CL 93 (L) 04/24/2020   CO2 25 04/24/2020   BUN 59 (H) 04/24/2020   CREATININE 11.81 (H) 04/24/2020   GLU 98 04/14/2019   CALCIUM 9.9 04/24/2020    ALBUMIN 2.5 (L) 04/24/2020   PHOS 5.8 (H) 04/24/2020    Rexene Agent, MD 04/26/2020  3:19 PM

## 2020-04-26 NOTE — Progress Notes (Signed)
ANTICOAGULATION CONSULT NOTE - Follow Up Consult  Pharmacy Consult for Warfarin Indication: Afib + MechAVR  Labs: Recent Labs    04/24/20 0512 04/25/20 0618 04/26/20 0519  HGB 8.1*  --   --   HCT 26.5*  --   --   PLT 152  --   --   LABPROT 52.6* 35.3* 34.1*  INR 6.1* 3.7* 3.5*  CREATININE 11.81*  --   --     Assessment: 66 yo male on warfarin for mech AVR/afib now s/p R BKA. INR was 2 on admission, down from 10.2 on 1/3. Warfarin was held for BKA but when restarted at 0.5mg  on 1/10, INR increased to 4.7. Vitamin K 1mg  PO given on 1/12 with INR down to goal 3.1. INR is extremely sensitive to 0.5- 1mg  doses.  INR 3.5 still at high end of goal despite holding last night. Patient is eating 50-85% of meals.   Warfarin PTA: 1mg  daily  Goal of Therapy:  INR goal 2.5 to 3.5 Monitor platelets per protocol : Yes    Plan:  Hold warfarin x1, will consider 0.5mg  QTue/Fri starting 1/21 Follow daily PT/INR and weekly CBC    Benetta Spar, PharmD, BCPS, BCCP Clinical Pharmacist  Please check AMION for all [place] phone numbers After 10:00 PM, call [place] [phone]

## 2020-04-27 ENCOUNTER — Inpatient Hospital Stay (HOSPITAL_COMMUNITY): Payer: Medicare Other

## 2020-04-27 ENCOUNTER — Inpatient Hospital Stay (HOSPITAL_COMMUNITY): Payer: Medicare Other | Admitting: Occupational Therapy

## 2020-04-27 DIAGNOSIS — M069 Rheumatoid arthritis, unspecified: Secondary | ICD-10-CM

## 2020-04-27 LAB — RENAL FUNCTION PANEL
Albumin: 2.7 g/dL — ABNORMAL LOW (ref 3.5–5.0)
Anion gap: 17 — ABNORMAL HIGH (ref 5–15)
BUN: 68 mg/dL — ABNORMAL HIGH (ref 8–23)
CO2: 25 mmol/L (ref 22–32)
Calcium: 10.3 mg/dL (ref 8.9–10.3)
Chloride: 91 mmol/L — ABNORMAL LOW (ref 98–111)
Creatinine, Ser: 11.24 mg/dL — ABNORMAL HIGH (ref 0.61–1.24)
GFR, Estimated: 5 mL/min — ABNORMAL LOW (ref >60)
Glucose, Bld: 125 mg/dL — ABNORMAL HIGH (ref 70–99)
Phosphorus: 5.5 mg/dL — ABNORMAL HIGH (ref 2.5–4.6)
Potassium: 4.4 mmol/L (ref 3.5–5.1)
Sodium: 133 mmol/L — ABNORMAL LOW (ref 135–145)

## 2020-04-27 LAB — CBC
HCT: 27.7 % — ABNORMAL LOW (ref 39.0–52.0)
Hemoglobin: 8.8 g/dL — ABNORMAL LOW (ref 13.0–17.0)
MCH: 33.7 pg (ref 26.0–34.0)
MCHC: 31.8 g/dL (ref 30.0–36.0)
MCV: 106.1 fL — ABNORMAL HIGH (ref 80.0–100.0)
Platelets: 213 10*3/uL (ref 150–400)
RBC: 2.61 MIL/uL — ABNORMAL LOW (ref 4.22–5.81)
RDW: 14.8 % (ref 11.5–15.5)
WBC: 6.2 10*3/uL (ref 4.0–10.5)
nRBC: 0 % (ref 0.0–0.2)

## 2020-04-27 LAB — PROTIME-INR
INR: 4.1 (ref 0.8–1.2)
Prothrombin Time: 38.4 seconds — ABNORMAL HIGH (ref 11.4–15.2)

## 2020-04-27 NOTE — Progress Notes (Addendum)
Occupational Therapy Session Note  Patient Details  Name: Jeremy [name] MRN: [id number] Date of Birth: October 14, 1955  Today's Date: 04/27/2020 OT Individual Time: 1120-1214 OT Individual Time Calculation (min): 54 min    Short Term Goals: Week 1:  OT Short Term Goal 1 (Week 1): STGs=LTGs due to ELOS  Skilled Therapeutic Interventions/Progress Updates:    Pt semi upright in bed, reporting fatigue from dialysis this morning, having just returned, nursing present to change dressing.  Pt reports "burning" sensation on posterior aspect of residual limb, assessed skin and noted area appears intact with no redness.  Pt completed supine to sit with mod I.  Educated pt on donning/doffing of RLE limb protector; required mod assist return demo.  Pt completed squat pivot to w/c with CGA.  Pt bathed and dressed UB, and completed oral hygiene with setup overall.  Pt required mod assist for LB dressing to pull pants and underwear up/down over hips while pt completed w/c press up due to pt reporting increased fatigue from dialysis.  Pt bathed LB seated using lateral leans with supervision.  Pts girlfriend arrived nearing end of session, and had questions regarding limb protector purpose and w/c recommendations at d/c. Educated pts girlfriend on purpose of limb protector.  PT made aware of girlfriends questions regarding w/c recommendations.  Also educated pt and girlfriend on importance of daily skin hygiene and skin checks to left foot.  Pt requesting back to bed at end of session. Squat pivot with CGA and sit to supine with supervision.  Call bell in reach, bed alarm on.  Therapy Documentation Precautions:  Precautions Precautions: Fall Precaution Comments: R BKA Restrictions Weight Bearing Restrictions: Yes RLE Weight Bearing: Non weight bearing   Therapy/Group: Individual Therapy  Ezekiel Slocumb 04/27/2020, 3:43 PM

## 2020-04-27 NOTE — Progress Notes (Signed)
Nephrology Follow-Up Consult Note   Assessment/Recommendations: Jeremy [name] is a/an 65 y.o. male with a past medical history significant for ESRD on HD admitted for RLE amputation.   # ESRD: MWF schedule, outpatient unit: [facility]. 3.75hrs, 2K, 2.25Ca, Na 137, Bicarbonate 35, Dialyzer: F180.  - HD today as per MWF schedule, tolerating well.  - 2K/2Ca, 3h, 2L UF, no heparin  # Volume/ hypertension: trend post weights with HD  # Anemia of Chronic Kidney Disease: was on Epo 8000 units 3x weekly as an outpatient. Increased Aranesp to 100 mcg.  # Secondary Hyperparathyroidism/Hyperphosphatemia: Home calcitriol 8mcg MWF.monitor phos level, on sevelamer.   # Vascular access: L FA AVF with no issues  # RLE ischemia. 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  Pain control and dressing changes per primary. 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 note bcx drawn given h/o mechanical aortic valve-neg. Possible cavitary appearing structure on cxr, ct chest does not reveal a cavitary lesion.  _____________________________________________________  CC: ESRD  Interval History/Subjective:   HD today, no complain, doing well.   Medications:  Current Facility-Administered Medications  Medication Dose Route Frequency Provider Last Rate Last Admin  . (feeding supplement) PROSource Plus liquid 30 mL  30 mL Oral BID BM Love, Pamela S, PA-C   30 mL at 04/26/20 1417  . acetaminophen (TYLENOL) tablet 500 mg  500 mg Oral Q4H PRN Bary Leriche, PA-C   500 mg at 04/25/20 2135  . allopurinol (ZYLOPRIM) tablet 100 mg  100 mg Oral Daily Bary Leriche, PA-C   100 mg at 04/27/20 3762  . aluminum hydroxide (AMPHOJEL/ALTERNAGEL) suspension 10 mL  10 mL Oral Q6H PRN Love, Pamela S, PA-C      . bisacodyl (DULCOLAX) suppository 10 mg  10 mg Rectal Daily PRN Love, Pamela S, PA-C      . calcitRIOL (ROCALTROL) capsule 1 mcg  1  mcg Oral Q M,W,F Love, Ivan Anchors, PA-C   1 mcg at 04/27/20 8315  . chlorhexidine (PERIDEX) 0.12 % solution 15 mL  15 mL Mouth Rinse BID Jamse Arn, MD   15 mL at 04/26/20 1957  . clopidogrel (PLAVIX) tablet 75 mg  75 mg Oral Daily Bary Leriche, PA-C   75 mg at 04/27/20 1761  . Darbepoetin Alfa (ARANESP) injection 100 mcg  100 mcg Intravenous Q Tue-HD Pearson Grippe B, MD   100 mcg at 04/24/20 1651  . diphenhydrAMINE (BENADRYL) 12.5 MG/5ML elixir 12.5-25 mg  12.5-25 mg Oral Q6H PRN Love, Pamela S, PA-C      . ezetimibe (ZETIA) tablet 10 mg  10 mg Oral Daily Bary Leriche, PA-C   10 mg at 04/27/20 6073  . guaiFENesin-dextromethorphan (ROBITUSSIN DM) 100-10 MG/5ML syrup 5-10 mL  5-10 mL Oral Q6H PRN Love, Pamela S, PA-C      . hydroxychloroquine (PLAQUENIL) tablet 400 mg  400 mg Oral QHS Love, Pamela S, PA-C   400 mg at 04/26/20 2315  . lamoTRIgine (LAMICTAL) tablet 50 mg  50 mg Oral BID Bary Leriche, PA-C   50 mg at 04/27/20 7106  . MEDLINE mouth rinse  15 mL Mouth Rinse q12n4p Jamse Arn, MD   15 mL at 04/25/20 1550  . methocarbamol (ROBAXIN) tablet 500 mg  500 mg Oral TID Bary Leriche, PA-C   500 mg at 04/27/20 2694  . metoprolol tartrate (LOPRESSOR) tablet  75 mg  75 mg Oral BID Bary Leriche, PA-C   75 mg at 04/26/20 4196  . milk and molasses enema  1 enema Rectal Daily PRN Love, Pamela S, PA-C      . oxyCODONE-acetaminophen (PERCOCET/ROXICET) 5-325 MG per tablet 1-2 tablet  1-2 tablet Oral Q4H PRN Bary Leriche, PA-C   2 tablet at 04/27/20 2229  . pantoprazole (PROTONIX) EC tablet 40 mg  40 mg Oral Daily Bary Leriche, PA-C   40 mg at 04/27/20 7989  . polyethylene glycol (MIRALAX / GLYCOLAX) packet 17 g  17 g Oral Daily Bary Leriche, PA-C   17 g at 04/27/20 2119  . polyethylene glycol (MIRALAX / GLYCOLAX) packet 17 g  17 g Oral Daily PRN Love, Pamela S, PA-C      . predniSONE (DELTASONE) tablet 5 mg  5 mg Oral Daily Love, Ivan Anchors, PA-C   5 mg at 04/27/20 0724  .  prochlorperazine (COMPAZINE) tablet 5-10 mg  5-10 mg Oral Q6H PRN Bary Leriche, PA-C   10 mg at 04/25/20 1710   Or  . prochlorperazine (COMPAZINE) injection 5-10 mg  5-10 mg Intramuscular Q6H PRN Love, Pamela S, PA-C       Or  . prochlorperazine (COMPAZINE) suppository 12.5 mg  12.5 mg Rectal Q6H PRN Love, Pamela S, PA-C      . rosuvastatin (CRESTOR) tablet 40 mg  40 mg Oral q1800 Bary Leriche, PA-C   40 mg at 04/26/20 1711  . sevelamer carbonate (RENVELA) tablet 3,200 mg  3,200 mg Oral TID WC LoveIvan Anchors, PA-C   3,200 mg at 04/27/20 4174  . simethicone (MYLICON) chewable tablet 80 mg  80 mg Oral QID PRN Love, Pamela S, PA-C      . traZODone (DESYREL) tablet 25 mg  25 mg Oral QHS Bary Leriche, PA-C   25 mg at 04/26/20 1957  . Warfarin - Pharmacist Dosing Inpatient   Does not apply [id number] Flora Lipps         Physical Exam: Vitals:   04/27/20 0830 04/27/20 0900  BP: (!) 105/43 (!) 104/50  Pulse: 62 76  Resp:    Temp:    SpO2:     Total I/O In: 120 [P.O.:120] Out: -   Intake/Output Summary (Last 24 hours) at 04/27/2020 8185 Last data filed at 04/27/2020 0715 Gross per 24 hour  Intake 600 ml  Output --  Net 600 ml   Constitutional: NAD, lying on bed CV: S1S2 no rub Respiratory: clear and unlabored Gastrointestinal: soft, non-tender, nondistended Ext: RLE absent below the knee, lue avf +b/t neuro: alert and oriented x3 conversant and follows commands Psych normal mood and affect   Test Results I personally reviewed new and old clinical labs and radiology tests Lab Results  Component Value Date   NA 132 (L) 04/26/2020   K 4.8 04/26/2020   CL 91 (L) 04/26/2020   CO2 24 04/26/2020   BUN 59 (H) 04/26/2020   CREATININE 9.64 (H) 04/26/2020   GLU 98 04/14/2019   CALCIUM 10.1 04/26/2020   ALBUMIN 2.8 (L) 04/26/2020   PHOS 5.7 (H) 04/26/2020    Ruthanna Macchia Tanna Furry, MD 04/27/2020  9:22 AM

## 2020-04-27 NOTE — Progress Notes (Signed)
CRITICAL VALUE ALERT  Critical Value:  INR 4.1  Date & Time Notied:  04/27/20. 0800  Provider Notified: Algis Liming, PA  Orders Received/Actions taken: new order received to check protime post dialysis.   Gerald Stabs, RN

## 2020-04-27 NOTE — Progress Notes (Addendum)
[place] PHYSICAL MEDICINE & REHABILITATION PROGRESS NOTE  Subjective/Complaints: Patient seen laying in bed this AM in HD.  He missed his dialysis as well as therapies yesterday due to precautions from COVID test. He states he slept very well overnight.  He had questions about drainage from stump. Nephro notes reviewed - increased Aranesp.   ROS: Denies CP, SOB, N/V/D  Objective: Vital Signs: Blood pressure 112/66, pulse 82, temperature 98.2 F (36.8 C), resp. rate 16, height 5\' 10"  (1.778 m), weight 70 kg, SpO2 100 %. No results found. Recent Labs    04/27/20 0812  WBC 6.2  HGB 8.8*  HCT 27.7*  PLT 213   Recent Labs    04/26/20 1528 04/27/20 0820  NA 132* 133*  K 4.8 4.4  CL 91* 91*  CO2 24 25  GLUCOSE 125* 125*  BUN 59* 68*  CREATININE 9.64* 11.24*  CALCIUM 10.1 10.3    Intake/Output Summary (Last 24 hours) at 04/27/2020 1153 Last data filed at 04/27/2020 1054 Gross per 24 hour  Intake 600 ml  Output 1000 ml  Net -400 ml        Physical Exam: BP 112/66 (BP Location: Right Arm)   Pulse 82   Temp 98.2 F (36.8 C)   Resp 16   Ht 5\' 10"  (1.778 m)   Wt 70 kg   SpO2 100%   BMI 22.14 kg/m  Constitutional: No distress . Vital signs reviewed. HENT: Normocephalic.  Atraumatic. Eyes: EOMI. No discharge. Cardiovascular: No JVD.  RRR. + Murmur Respiratory: Normal effort.  No stridor.  Bilateral clear to auscultation. GI: Non-distended.  BS +. Skin: Warm and dry.   Right BKA with mild serous drainage from lateral incision. Psych: Normal mood.  Normal behavior. Musc: Right BKA with improving edema and tenderness. Neuro: Alert Motor: Bilateral upper extremities: 5/5 proximal distal Left lower extremity: 5/5 proximal distal, unchanged Right lower extremity: Hip flexion 4/5, knee extension 4/5, improving  Assessment/Plan: 1. Functional deficits which require 3+ hours per day of interdisciplinary therapy in a comprehensive inpatient rehab  setting.  Physiatrist is providing close team supervision and 24 hour management of active medical problems listed below.  Physiatrist and rehab team continue to assess barriers to discharge/monitor patient progress toward functional and medical goals   Care Tool:  Bathing              Bathing assist Assist Level: Supervision/Verbal cueing (at bed level per md orders)     Upper Body Dressing/Undressing Upper body dressing   What is the patient wearing?: Hospital gown only    Upper body assist Assist Level: Set up assist    Lower Body Dressing/Undressing Lower body dressing      What is the patient wearing?: Underwear/pull up     Lower body assist Assist for lower body dressing: Minimal Assistance - Patient > 75%     Toileting Toileting    Toileting assist Assist for toileting: Minimal Assistance - Patient > 75%     Transfers Chair/bed transfer  Transfers assist     Chair/bed transfer assist level: Minimal Assistance - Patient > 75%     Locomotion Ambulation   Ambulation assist   Ambulation activity did not occur: Safety/medical concerns          Walk 10 feet activity   Assist  Walk 10 feet activity did not occur: Safety/medical concerns        Walk 50 feet activity   Assist Walk 50 feet with 2 turns  activity did not occur: Safety/medical concerns         Walk 150 feet activity   Assist Walk 150 feet activity did not occur: Safety/medical concerns         Walk 10 feet on uneven surface  activity   Assist Walk 10 feet on uneven surfaces activity did not occur: Safety/medical concerns         Wheelchair     Assist Will patient use wheelchair at discharge?: Yes Type of Wheelchair: Manual    Wheelchair assist level: Supervision/Verbal cueing Max wheelchair distance: 200FT    Wheelchair 50 feet with 2 turns activity    Assist        Assist Level: Supervision/Verbal cueing   Wheelchair 150 feet  activity     Assist      Assist Level: Supervision/Verbal cueing    Medical Problem List and Plan: 1.Impaired function and gait and 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 2.Mechanical AVR/Antithrombotics: -DVT/anticoagulation:Pharmaceutical:Coumadin (goal 2.5-3.5)  INR supratherapeutic on 1/21 -antiplatelet therapy: N/A 3. Pain Management:Continue robaxin 500 mg tid and oxycodone prnis effective.  Controlled on 1/20  Monitor with increased exertion 4. Mood:LCSW to follow for evaluation and support. -antipsychotic agents: N/A 5. Neuropsych: This patientiscapable of making decisions on hisown behalf. 6. Skin/Wound Care:Monitor wound for healing. Prosource to help promote wound healing.  Continue nonadherent, Kerlix, Ace wraps to BKA 7. Fluids/Electrolytes/Nutrition:Strict I/Os. Renal diet with 1200 cc /FR. 8. Renal cancer s/p B-nephrectomy/ESRD:  Schedule HD- usually M/W/F-at the end of the day to help with tolerance of therapy, currently Tuesday Thursday Saturday-?  Plans to transition back   Renevala with meals to manage metabolic bone disease.  9. A fib: Monitor HR tid- on coumadin.   Continue Metoprolol bid.   Relatively controlled on 1/20  Monitor with increased activity 10. Anemia of chronic disease/macrocytosis: On aranesp every Monday for supplementation. Monitor for signs of bleeding.   Hemoglobin 8.8 on 1/21, labs with HD  Vitamin B12/folate ordered with next of labs, not drawn yet 11. GERD: continue Protonix with simethicone prn.  12. CAD s/p CABG: On Crestor and metoprolol. Monitor for symptoms with activity.  Plavix resumed after discussion with vascular 13. OSA: Continue CPAP 14.  RA  Continue Plaquenil and prednisone  15.  Fever: Resolved  COVID-negative  LOS: 4 days A FACE TO FACE EVALUATION WAS PERFORMED  Jomarion Mish Lorie Phenix 04/27/2020, 11:53 AM

## 2020-04-27 NOTE — Progress Notes (Signed)
Patient ID: Jeremy [name], male   DOB: 1955/12/27, 65 y.o.   MRN: [id number]      Diagnosis codes:S88.112A, N18.6 & Z95.2  Height:  5'10              Weight:  180 lbs          Patient suffers from R-BKA   which impairs their ability to perform daily activities like tolieting and ADL's   in the home.  A walker  will not resolve issue with performing activities of daily living.  A wheelchair will allow patient to safely perform daily activities.  Patient is not able to propel themselves in the home using a standard weight wheelchair due to endurance and activity tolerance .  Patient can self propel in the lightweight wheelchair. Length of need 9-12 months

## 2020-04-27 NOTE — Progress Notes (Signed)
ANTICOAGULATION CONSULT NOTE - Follow Up Consult  Pharmacy Consult for Warfarin Indication: Afib + MechAVR  Labs: Recent Labs    04/25/20 0618 04/26/20 0519 04/26/20 1528 04/27/20 0516 04/27/20 0812 04/27/20 0820  HGB  --   --   --   --  8.8*  --   HCT  --   --   --   --  27.7*  --   PLT  --   --   --   --  213  --   LABPROT 35.3* 34.1*  --  38.4*  --   --   INR 3.7* 3.5*  --  4.1*  --   --   CREATININE  --   --  9.64*  --   --  11.24*    Assessment: 66 yo male on warfarin for mech AVR/afib now s/p R BKA. INR was 2 on admission, down from 10.2 on 1/3. Warfarin was held for BKA but when restarted at 0.5mg  on 1/10, INR increased to 4.7. Vitamin K 1mg  PO given on 1/12 with INR down to goal 3.1. INR is extremely sensitive to 0.5- 1mg  doses.  INR 4.1 rising despite holding last night. Patient is eating 50-85% of meals.  Last warfarin dose of 1mg  given on 1/18.  Warfarin PTA: 1mg  daily  Goal of Therapy:  INR goal 2.5 to 3.5 Monitor platelets per protocol : Yes    Plan:  Continue to hold warfarin  Follow daily PT/INR and weekly CBC   Iyonnah Ferrante, Pharm.D., BCPS Clinical Pharmacist  **Pharmacist phone directory can be found on [email or web address] listed under [place].  04/27/2020 11:55 AM

## 2020-04-27 NOTE — Progress Notes (Signed)
Physical Therapy Session Note  Patient Details  Name: Jeremy [name] MRN: [id number] Date of Birth: March 05, 1956  Pt was off the floor for HD for both AM sessions that were scheduled at 0800 and 1000. Pt missed 120 minutes of skilled therapy  [place] PT, DPT 04/27/2020, 10:21 AM

## 2020-04-28 ENCOUNTER — Inpatient Hospital Stay (HOSPITAL_COMMUNITY): Payer: Medicare Other | Admitting: Physical Therapy

## 2020-04-28 ENCOUNTER — Inpatient Hospital Stay (HOSPITAL_COMMUNITY): Payer: Medicare Other | Admitting: Occupational Therapy

## 2020-04-28 LAB — PROTIME-INR
INR: 3.1 — ABNORMAL HIGH (ref 0.8–1.2)
Prothrombin Time: 31.1 seconds — ABNORMAL HIGH (ref 11.4–15.2)

## 2020-04-28 NOTE — Progress Notes (Signed)
Physical Therapy Session Note  Patient Details  Name: Jeremy [name] MRN: [id number] Date of Birth: September 23, 1955  Today's Date: 04/28/2020 PT Individual Time: 1005-1100 PT Individual Time Calculation (min): 55 min   Short Term Goals: Week 1:  PT Short Term Goal 1 (Week 1): STG = LTG due to ELOS  Skilled Therapeutic Interventions/Progress Updates:    Pt received supine in bed and agreeable to therapy session. Pt expresses concerns regarding his home entrance with 2 step-ups onto a porch then a ~2inch threshold to enter the home - pt reports having ordered a portable ramp and is going to ask his GF to try to install it to determine if it will fit properly. Pt noted to have ACE bandage on although shrinker delivered to his room - educated pt on wearing shrinker during the day for limb shaping and demonstrated how to don. Pt donned limb guard with only set-up assist. Therapist provided extensive amputee education on importance of maintaining full R LE knee and hip extension ROM with education on functional mobility implications and then importance of performing daily L foot skin assessments. Educated pt on current LOS and LTGs with pt having missed 2 days of therapy due to COVID testing and HD with likely need to use w/c in the home and educated pt on fall risk safety and to call 911 in event of a fall with the importance of wearing his limb protector. Discussed that his brain is having to relearn a new way of moving now that he has had R LE BKA and that often people will subconsciously move forgetting about the amputation and that can result in  fall. L squat pivot transfer EOB>w/c with CGA for steadying/safety. B UE w/c propulsion ~175ft to main therapy gym with distant supervision and pt demoing good propulsion mechanics with efficient push strokes. Sit>stands w/c>// bars with CGA/min assist for steadying/balance with pt pulling up on bars. Gait training ~31ft forward/backwards 2x in // bars with CGA for  steadying forwards and min assist for lifting and balance backwards. Sit>stands w/c>RW with CGA/min assist for steadying/balance - cuing for proper UE placement during transfer. Gait training using RW ~69ft x2 with CGA/min assist for balance and on 2nd walk when pt hopping backwards to sit in chair pt requiring mod assist to land safely in w/c as he tried to step back on R LE with pt forgetting about amputation. Therapist provided pt with home measurement sheet and he reports his GF measured majority of home doors are 36inches wide but there are 2 doors to the bathroom and shower that are 22inches wide; high EOB (not measured); and they have an older model Honda CRV and [place] he could go home in. Performed B UE w/c propulsion back to room and left seated in w/c with needs in reach and chair alarm on.   Therapy Documentation Precautions:  Precautions Precautions: Fall Precaution Comments: R BKA Restrictions Weight Bearing Restrictions: Yes RLE Weight Bearing: Non weight bearing  Pain:  Grimaces during donning R LE shrinker and limb guard but no reports of pain during session. Reports he has some "numbness" in L knee during mobility but denies pain with it.   Therapy/Group: Individual Therapy  Tawana Scale , PT, DPT, CSRS  04/28/2020, 8:10 AM

## 2020-04-28 NOTE — Progress Notes (Signed)
Patient placed himself on home CPAP unit for the night.  

## 2020-04-28 NOTE — Progress Notes (Signed)
ANTICOAGULATION CONSULT NOTE - Follow Up Consult  Pharmacy Consult for Warfarin Indication: atrial fibrillation, mechanical AVR  Allergies  Allergen Reactions  . Statins Other (See Comments)    Arthralgias (intolerance), Myalgias (intolerance)  Pt taking pravastatin   . Gabapentin     Drowsy   . Sertraline Rash    Patient Measurements: Height: 5\' 10"  (177.8 cm) Weight: 72.1 kg (158 lb 15.2 oz) IBW/kg (Calculated) : 73 kg  Vital Signs: Temp: 98.6 F (37 C) (01/22 0441) BP: 137/92 (01/22 0441) Pulse Rate: 97 (01/22 0441)  Labs: Recent Labs    04/26/20 0519 04/26/20 1528 04/27/20 0516 04/27/20 0812 04/27/20 0820 04/28/20 0618  HGB  --   --   --  8.8*  --   --   HCT  --   --   --  27.7*  --   --   PLT  --   --   --  213  --   --   LABPROT 34.1*  --  38.4*  --   --  31.1*  INR 3.5*  --  4.1*  --   --  3.1*  CREATININE  --  9.64*  --   --  11.24*  --     Estimated Creatinine Clearance: 6.9 mL/min (A) (by C-G formula based on SCr of 11.24 mg/dL (H)).   Assessment: 65 yo male on warfarin for mechanical AVR/afib now s/p R BKA. INR was 2 on admission (PTA warfarin dose 1 mg daily), down from 10.2 on 1/3. Warfarin was held for BKA but when restarted at 0.5 mg on 1/10, INR increased to 4.7. Vitamin K 1 mg PO given on 1/12 with INR down to goal 3.1. INR is extremely sensitive to 0.5-1 mg doses. Last warfarin dose on 1/14.  INR today is 3.1. The patient's diet has increased, he is eating 80-100% of meals. No new interacting medications have been initiated. The patient was previously therapeutic and despite not receiving warfarin, INR increased to supratherapeutic. Due to this and combined with the fact that the patient is very sensitive to small warfarin doses, will continue to hold warfarin.    Goal of Therapy:  INR goal 2.5-3.5 (per outpatient anti-coag visit notes in chart review) Monitor platelets per protocol : Yes    Plan:  - HOLD warfarin - Monitor daily PT/INR and  weekly CBC  Shauna Hugh, PharmD, [place]  PGY-1 Pharmacy Resident 04/28/2020 8:49 AM  Please check [email or web address] for unit-specific pharmacy phone numbers.

## 2020-04-28 NOTE — Progress Notes (Signed)
[place] PHYSICAL MEDICINE & REHABILITATION PROGRESS NOTE  Subjective/Complaints:  Pt reports will ask renal if they can 'challenge" him with more fluid being taken off next time.  Got HD yesterday.  Sore on spot on back of R BKA- with associated phantom pain- LBM this AM.    ROS:  Pt denies SOB, abd pain, CP, N/V/C/D, and vision changes   Objective: Vital Signs: Blood pressure (!) 137/92, pulse 97, temperature 98.6 F (37 C), resp. rate 16, height 5\' 10"  (1.778 m), weight 72.1 kg, SpO2 100 %. No results found. Recent Labs    04/27/20 0812  WBC 6.2  HGB 8.8*  HCT 27.7*  PLT 213   Recent Labs    04/26/20 1528 04/27/20 0820  NA 132* 133*  K 4.8 4.4  CL 91* 91*  CO2 24 25  GLUCOSE 125* 125*  BUN 59* 68*  CREATININE 9.64* 11.24*  CALCIUM 10.1 10.3    Intake/Output Summary (Last 24 hours) at 04/28/2020 1240 Last data filed at 04/28/2020 0756 Gross per 24 hour  Intake 490 ml  Output --  Net 490 ml        Physical Exam: BP (!) 137/92 (BP Location: Right Arm)   Pulse 97   Temp 98.6 F (37 C)   Resp 16   Ht 5\' 10"  (1.778 m)   Wt 72.1 kg   SpO2 100%   BMI 22.81 kg/m  Constitutional: No distress . Vital signs reviewed. Laying in bed- appropriate, NAD HENT: Normocephalic.  Atraumatic. Eyes: EOMI. No discharge. Cardiovascular: RRR + Murmur from valve Respiratory: CTA B/L- no W/R/R- good air movement GI: Soft, NT, ND, (+)BS . Skin: Warm and dry.   Right BKA with mild serous drainage from lateral incision.- TTP overall, but more on lateral aspect near posterior aspect of R BKA Psych: appropriate Musc: Right BKA with improving edema and tenderness. Neuro: Alert Motor: Bilateral upper extremities: 5/5 proximal distal Left lower extremity: 5/5 proximal distal, unchanged Right lower extremity: Hip flexion 4/5, knee extension 4/5, improving  Assessment/Plan: 1. Functional deficits which require 3+ hours per day of interdisciplinary therapy in a comprehensive  inpatient rehab setting.  Physiatrist is providing close team supervision and 24 hour management of active medical problems listed below.  Physiatrist and rehab team continue to assess barriers to discharge/monitor patient progress toward functional and medical goals   Care Tool:  Bathing              Bathing assist Assist Level: Supervision/Verbal cueing     Upper Body Dressing/Undressing Upper body dressing   What is the patient wearing?: Pull over shirt    Upper body assist Assist Level: Set up assist    Lower Body Dressing/Undressing Lower body dressing      What is the patient wearing?: Underwear/pull up,Pants,Orthosis (limb protector)     Lower body assist Assist for lower body dressing: Moderate Assistance - Patient 50 - 74%     Toileting Toileting    Toileting assist Assist for toileting: Minimal Assistance - Patient > 75%     Transfers Chair/bed transfer  Transfers assist     Chair/bed transfer assist level: Minimal Assistance - Patient > 75%     Locomotion Ambulation   Ambulation assist   Ambulation activity did not occur: Safety/medical concerns          Walk 10 feet activity   Assist  Walk 10 feet activity did not occur: Safety/medical concerns        Walk 50  feet activity   Assist Walk 50 feet with 2 turns activity did not occur: Safety/medical concerns         Walk 150 feet activity   Assist Walk 150 feet activity did not occur: Safety/medical concerns         Walk 10 feet on uneven surface  activity   Assist Walk 10 feet on uneven surfaces activity did not occur: Safety/medical concerns         Wheelchair     Assist Will patient use wheelchair at discharge?: Yes Type of Wheelchair: Manual    Wheelchair assist level: Supervision/Verbal cueing Max wheelchair distance: 200FT    Wheelchair 50 feet with 2 turns activity    Assist        Assist Level: Supervision/Verbal cueing    Wheelchair 150 feet activity     Assist      Assist Level: Supervision/Verbal cueing    Medical Problem List and Plan: 1.Impaired function and gait and 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 2.Mechanical AVR/Antithrombotics: -DVT/anticoagulation:Pharmaceutical:Coumadin (goal 2.5-3.5)  INR supratherapeutic on 1/21  1/22- INR 3.1- doing better -antiplatelet therapy: N/A 3. Pain Management:Continue robaxin 500 mg tid and oxycodone prnis effective.  Controlled on 1/20  1/22- having some phantom pain- allergic to Gabapentin - won't use or Lyrica due to that allergy- will leave for Dr Posey Pronto if wants to add meds.   Monitor with increased exertion 4. Mood:LCSW to follow for evaluation and support. -antipsychotic agents: N/A 5. Neuropsych: This patientiscapable of making decisions on hisown behalf. 6. Skin/Wound Care:Monitor wound for healing. Prosource to help promote wound healing.  Continue nonadherent, Kerlix, Ace wraps to BKA 7. Fluids/Electrolytes/Nutrition:Strict I/Os. Renal diet with 1200 cc /FR. 8. Renal cancer s/p B-nephrectomy/ESRD:  Schedule HD- usually M/W/F-at the end of the day to help with tolerance of therapy, currently Tuesday Thursday Saturday-?  Plans to transition back   Renevala with meals to manage metabolic bone disease.  9. A fib: Monitor HR tid- on coumadin.   Continue Metoprolol bid.   1/22- rate controlled- con't regimen and warfarin- INR 3.1  Monitor with increased activity 10. Anemia of chronic disease/macrocytosis: On aranesp every Monday for supplementation. Monitor for signs of bleeding.   Hemoglobin 8.8 on 1/21, labs with HD  Vitamin B12/folate ordered with next of labs, not drawn yet 11. GERD: continue Protonix with simethicone prn.  12. CAD s/p CABG: On Crestor and metoprolol. Monitor for symptoms with activity.  Plavix resumed after discussion with  vascular 13. OSA: Continue CPAP 14.  RA  Continue Plaquenil and prednisone  15.  Fever: Resolved  COVID-negative  LOS: 5 days A FACE TO FACE EVALUATION WAS PERFORMED  Yumna Ebers 04/28/2020, 12:40 PM

## 2020-04-28 NOTE — Progress Notes (Signed)
Nephrology Follow-Up Consult Note   Assessment/Recommendations: Jeremy [name] is a/an 65 y.o. male with a past medical history significant for ESRD on HD admitted for RLE amputation.   # ESRD: MWF schedule, outpatient unit: [facility]. 3.75hrs, 2K, 2.25Ca, Na 137, Bicarbonate 35, Dialyzer: F180.  - HD on 1/21 as per MWF schedule, tolerated well, UF 1 L. Next HD on 1/24.  - 2K/2Ca, 3h, 2L UF, no heparin  # Volume/ hypertension: trend post weights with HD  # Anemia of Chronic Kidney Disease: was on Epo 8000 units 3x weekly as an outpatient. Increased Aranesp to 100 mcg.  # Secondary Hyperparathyroidism/Hyperphosphatemia: DC calcitriol  because of elevated caclium level. monitor phos level, on sevelamer.   # Vascular access: L FA AVF with no issues  # RLE ischemia. 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  Pain control and dressing changes per primary. 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 note bcx drawn given h/o mechanical aortic valve-neg. Possible cavitary appearing structure on cxr, ct chest does not reveal a cavitary lesion.  _____________________________________________________  CC: ESRD  Interval History/Subjective:   Denies nausea vomiting chest pain shortness of breath.  No new event.  Medications:  Current Facility-Administered Medications  Medication Dose Route Frequency Provider Last Rate Last Admin  . (feeding supplement) PROSource Plus liquid 30 mL  30 mL Oral BID BM Love, Ivan Anchors, PA-C   30 mL at 04/28/20 0917  . acetaminophen (TYLENOL) tablet 500 mg  500 mg Oral Q4H PRN Bary Leriche, PA-C   500 mg at 04/27/20 2115  . allopurinol (ZYLOPRIM) tablet 100 mg  100 mg Oral Daily Bary Leriche, PA-C   100 mg at 04/28/20 0915  . aluminum hydroxide (AMPHOJEL/ALTERNAGEL) suspension 10 mL  10 mL Oral Q6H PRN Love, Pamela S, PA-C      . bisacodyl (DULCOLAX) suppository 10 mg  10 mg  Rectal Daily PRN Love, Pamela S, PA-C      . calcitRIOL (ROCALTROL) capsule 1 mcg  1 mcg Oral Q M,W,F Love, Ivan Anchors, PA-C   1 mcg at 04/27/20 7591  . chlorhexidine (PERIDEX) 0.12 % solution 15 mL  15 mL Mouth Rinse BID Jamse Arn, MD   15 mL at 04/28/20 0917  . clopidogrel (PLAVIX) tablet 75 mg  75 mg Oral Daily Bary Leriche, PA-C   75 mg at 04/28/20 6384  . Darbepoetin Alfa (ARANESP) injection 100 mcg  100 mcg Intravenous Q Tue-HD Pearson Grippe B, MD   100 mcg at 04/24/20 1651  . diphenhydrAMINE (BENADRYL) 12.5 MG/5ML elixir 12.5-25 mg  12.5-25 mg Oral Q6H PRN Love, Pamela S, PA-C      . ezetimibe (ZETIA) tablet 10 mg  10 mg Oral Daily Bary Leriche, PA-C   10 mg at 04/28/20 6659  . guaiFENesin-dextromethorphan (ROBITUSSIN DM) 100-10 MG/5ML syrup 5-10 mL  5-10 mL Oral Q6H PRN Love, Pamela S, PA-C      . hydroxychloroquine (PLAQUENIL) tablet 400 mg  400 mg Oral QHS Love, Pamela S, PA-C   400 mg at 04/27/20 2116  . lamoTRIgine (LAMICTAL) tablet 50 mg  50 mg Oral BID Bary Leriche, PA-C   50 mg at 04/28/20 0913  . MEDLINE mouth rinse  15 mL Mouth Rinse q12n4p Jamse Arn, MD   15 mL at 04/27/20 1230  . methocarbamol (ROBAXIN) tablet 500 mg  500 mg Oral  TID Bary Leriche, PA-C   500 mg at 04/28/20 0915  . metoprolol tartrate (LOPRESSOR) tablet 75 mg  75 mg Oral BID Bary Leriche, PA-C   75 mg at 04/28/20 5409  . milk and molasses enema  1 enema Rectal Daily PRN Love, Pamela S, PA-C      . oxyCODONE-acetaminophen (PERCOCET/ROXICET) 5-325 MG per tablet 1-2 tablet  1-2 tablet Oral Q4H PRN Bary Leriche, PA-C   2 tablet at 04/28/20 0915  . pantoprazole (PROTONIX) EC tablet 40 mg  40 mg Oral Daily Bary Leriche, PA-C   40 mg at 04/28/20 8119  . polyethylene glycol (MIRALAX / GLYCOLAX) packet 17 g  17 g Oral Daily Bary Leriche, PA-C   17 g at 04/28/20 1478  . polyethylene glycol (MIRALAX / GLYCOLAX) packet 17 g  17 g Oral Daily PRN Love, Pamela S, PA-C      . predniSONE (DELTASONE)  tablet 5 mg  5 mg Oral Daily Love, Ivan Anchors, PA-C   5 mg at 04/28/20 2956  . prochlorperazine (COMPAZINE) tablet 5-10 mg  5-10 mg Oral Q6H PRN Bary Leriche, PA-C   10 mg at 04/25/20 1710   Or  . prochlorperazine (COMPAZINE) injection 5-10 mg  5-10 mg Intramuscular Q6H PRN Love, Pamela S, PA-C       Or  . prochlorperazine (COMPAZINE) suppository 12.5 mg  12.5 mg Rectal Q6H PRN Love, Pamela S, PA-C      . rosuvastatin (CRESTOR) tablet 40 mg  40 mg Oral q1800 Bary Leriche, PA-C   40 mg at 04/27/20 1759  . sevelamer carbonate (RENVELA) tablet 3,200 mg  3,200 mg Oral TID WC LoveIvan Anchors, PA-C   3,200 mg at 04/28/20 2130  . simethicone (MYLICON) chewable tablet 80 mg  80 mg Oral QID PRN Love, Pamela S, PA-C      . traZODone (DESYREL) tablet 25 mg  25 mg Oral QHS Bary Leriche, PA-C   25 mg at 04/27/20 2115  . Warfarin - Pharmacist Dosing Inpatient   Does not apply [id number] Flora Lipps         Physical Exam: Vitals:   04/27/20 1935 04/28/20 0441  BP: 111/76 (!) 137/92  Pulse: 94 97  Resp: 17 16  Temp: 98.3 F (36.8 C) 98.6 F (37 C)  SpO2: 100% 100%   Total I/O In: 240 [P.O.:240] Out: -   Intake/Output Summary (Last 24 hours) at 04/28/2020 1059 Last data filed at 04/28/2020 0756 Gross per 24 hour  Intake 490 ml  Output --  Net 490 ml   Constitutional: NAD, lying on bed CV: S1S2 no rub Respiratory: clear and unlabored Gastrointestinal: soft, non-tender, nondistended Ext: RLE absent below the knee, lue avf +b/t neuro: alert and oriented x3 conversant and follows commands Psych normal mood and affect   Test Results I personally reviewed new and old clinical labs and radiology tests Lab Results  Component Value Date   NA 133 (L) 04/27/2020   K 4.4 04/27/2020   CL 91 (L) 04/27/2020   CO2 25 04/27/2020   BUN 68 (H) 04/27/2020   CREATININE 11.24 (H) 04/27/2020   GLU 98 04/14/2019   CALCIUM 10.3 04/27/2020   ALBUMIN 2.7 (L) 04/27/2020   PHOS 5.5 (H) 04/27/2020     Benecio Kluger Tanna Furry, MD 04/28/2020  10:59 AM

## 2020-04-29 ENCOUNTER — Inpatient Hospital Stay (HOSPITAL_COMMUNITY): Payer: Medicare Other

## 2020-04-29 LAB — PROTIME-INR
INR: 3.4 — ABNORMAL HIGH (ref 0.8–1.2)
Prothrombin Time: 33 seconds — ABNORMAL HIGH (ref 11.4–15.2)

## 2020-04-29 MED ORDER — DARBEPOETIN ALFA 100 MCG/0.5ML IJ SOSY
100.0000 ug | PREFILLED_SYRINGE | INTRAMUSCULAR | Status: DC
  Filled 2020-04-29: qty 0.5

## 2020-04-29 MED ORDER — CHLORHEXIDINE GLUCONATE CLOTH 2 % EX PADS: 6.0000 | MEDICATED_PAD | Freq: Every day | CUTANEOUS | Status: DC

## 2020-04-29 NOTE — Progress Notes (Signed)
ANTICOAGULATION CONSULT NOTE - Follow Up Consult  Pharmacy Consult for Warfarin Indication: atrial fibrillation, mechanical AVR  Allergies  Allergen Reactions  . Statins Other (See Comments)    Arthralgias (intolerance), Myalgias (intolerance)  Pt taking pravastatin   . Gabapentin     Drowsy   . Sertraline Rash    Patient Measurements: Height: 5\' 10"  (177.8 cm) Weight: 72.1 kg (158 lb 15.2 oz) IBW/kg (Calculated) : 73 kg  Vital Signs: Temp: 98 F (36.7 C) (01/23 0445) BP: 143/88 (01/23 0445) Pulse Rate: 59 (01/23 0445)  Labs: Recent Labs    04/26/20 1528 04/27/20 0516 04/27/20 4259 04/27/20 0820 04/28/20 0618 04/29/20 0446  HGB  --   --  8.8*  --   --   --   HCT  --   --  27.7*  --   --   --   PLT  --   --  213  --   --   --   LABPROT  --  38.4*  --   --  31.1* 33.0*  INR  --  4.1*  --   --  3.1* 3.4*  CREATININE 9.64*  --   --  11.24*  --   --     Estimated Creatinine Clearance: 6.9 mL/min (A) (by C-G formula based on SCr of 11.24 mg/dL (H)).   Assessment: 65 yo male on warfarin for mechanical AVR/afib now s/p R BKA. INR was 2 on admission (PTA warfarin dose 1 mg daily), down from 10.2 on 1/3. Warfarin was held for BKA but when restarted at 0.5 mg on 1/10, INR increased to 4.7. Vitamin K 1 mg PO given on 1/12 with INR down to goal 3.1. INR is extremely sensitive to 0.5-1 mg doses. Last warfarin dose on 1/14.  INR today is 3.4, up from yesterdays 3.1 despite not receiving warfarin. The patient's diet has increased, he is eating 80-100% of meals. No new interacting medications have been initiated.  Goal of Therapy:  INR goal 2.5-3.5 (per outpatient anti-coag visit notes in chart review) Monitor platelets per protocol : Yes    Plan:  - HOLD warfarin today - Monitor daily PT/INR and weekly CBC  Shauna Hugh, PharmD, [place]  PGY-1 Pharmacy Resident 04/29/2020 7:43 AM  Please check [email or web address] for unit-specific pharmacy phone numbers.

## 2020-04-29 NOTE — Progress Notes (Signed)
Nephrology Follow-Up Consult Note   Assessment/Recommendations: Jeremy [name] is a/an 65 y.o. male with a past medical history significant for ESRD on HD admitted for RLE amputation.   # ESRD: MWF schedule, outpatient unit: [facility]. 3.75hrs, 2K, 2.25Ca, Na 137, Bicarbonate 35, Dialyzer: F180.  - HD on 1/21 as per MWF schedule, tolerated well, UF 1 L. Next HD on 1/24, HD ordered.   # Volume/ hypertension: trend post weights with HD. BP ok.   # Anemia of Chronic Kidney Disease: was on Epo 8000 units 3x weekly as an outpatient. Increased Aranesp to 100 mcg.  # Secondary Hyperparathyroidism/Hyperphosphatemia: DC calcitriol  because of elevated caclium level. monitor phos level, on sevelamer.   # Vascular access: L FA AVF with no issues  # RLE ischemia. 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  Pain control and dressing changes per primary. 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 note bcx drawn given h/o mechanical aortic valve-neg. Possible cavitary appearing structure on cxr, ct chest does not reveal a cavitary lesion.  _____________________________________________________  CC: ESRD  Interval History/Subjective:  No new event. Denies nausea vomiting chest pain shortness of breath.   Medications:  Current Facility-Administered Medications  Medication Dose Route Frequency Provider Last Rate Last Admin  . (feeding supplement) PROSource Plus liquid 30 mL  30 mL Oral BID BM Love, Pamela S, PA-C   30 mL at 04/29/20 1001  . acetaminophen (TYLENOL) tablet 500 mg  500 mg Oral Q4H PRN Bary Leriche, PA-C   500 mg at 04/28/20 1920  . allopurinol (ZYLOPRIM) tablet 100 mg  100 mg Oral Daily Bary Leriche, PA-C   100 mg at 04/28/20 0915  . aluminum hydroxide (AMPHOJEL/ALTERNAGEL) suspension 10 mL  10 mL Oral Q6H PRN Love, Pamela S, PA-C      . bisacodyl (DULCOLAX) suppository 10 mg  10 mg Rectal Daily PRN  Love, Pamela S, PA-C      . chlorhexidine (PERIDEX) 0.12 % solution 15 mL  15 mL Mouth Rinse BID Jamse Arn, MD   15 mL at 04/29/20 1001  . clopidogrel (PLAVIX) tablet 75 mg  75 mg Oral Daily Bary Leriche, PA-C   75 mg at 04/29/20 1003  . [START ON 05/02/2020] Darbepoetin Alfa (ARANESP) injection 100 mcg  100 mcg Intravenous Q Wed-HD Rosita Fire, MD      . diphenhydrAMINE (BENADRYL) 12.5 MG/5ML elixir 12.5-25 mg  12.5-25 mg Oral Q6H PRN Love, Pamela S, PA-C      . ezetimibe (ZETIA) tablet 10 mg  10 mg Oral Daily Love, Pamela S, PA-C   10 mg at 04/29/20 1003  . guaiFENesin-dextromethorphan (ROBITUSSIN DM) 100-10 MG/5ML syrup 5-10 mL  5-10 mL Oral Q6H PRN Love, Pamela S, PA-C      . hydroxychloroquine (PLAQUENIL) tablet 400 mg  400 mg Oral QHS Love, Pamela S, PA-C   400 mg at 04/28/20 2142  . lamoTRIgine (LAMICTAL) tablet 50 mg  50 mg Oral BID Reesa Chew S, PA-C   50 mg at 04/29/20 1002  . MEDLINE mouth rinse  15 mL Mouth Rinse q12n4p Jamse Arn, MD   15 mL at 04/28/20 1727  . methocarbamol (ROBAXIN) tablet 500 mg  500 mg Oral TID Love, Pamela S, PA-C   500 mg at 04/29/20 1003  . metoprolol tartrate (LOPRESSOR) tablet 75 mg  75 mg Oral BID Reesa Chew  S, PA-C   75 mg at 04/29/20 1003  . milk and molasses enema  1 enema Rectal Daily PRN Love, Pamela S, PA-C      . oxyCODONE-acetaminophen (PERCOCET/ROXICET) 5-325 MG per tablet 1-2 tablet  1-2 tablet Oral Q4H PRN Bary Leriche, PA-C   2 tablet at 04/29/20 1012  . pantoprazole (PROTONIX) EC tablet 40 mg  40 mg Oral Daily Bary Leriche, PA-C   40 mg at 04/29/20 1004  . polyethylene glycol (MIRALAX / GLYCOLAX) packet 17 g  17 g Oral Daily Bary Leriche, PA-C   17 g at 04/29/20 1004  . polyethylene glycol (MIRALAX / GLYCOLAX) packet 17 g  17 g Oral Daily PRN Love, Pamela S, PA-C      . predniSONE (DELTASONE) tablet 5 mg  5 mg Oral Daily Love, Pamela S, PA-C   5 mg at 04/29/20 1003  . prochlorperazine (COMPAZINE) tablet 5-10 mg   5-10 mg Oral Q6H PRN Bary Leriche, PA-C   10 mg at 04/28/20 1919   Or  . prochlorperazine (COMPAZINE) injection 5-10 mg  5-10 mg Intramuscular Q6H PRN Love, Pamela S, PA-C       Or  . prochlorperazine (COMPAZINE) suppository 12.5 mg  12.5 mg Rectal Q6H PRN Love, Pamela S, PA-C      . rosuvastatin (CRESTOR) tablet 40 mg  40 mg Oral q1800 Bary Leriche, PA-C   40 mg at 04/28/20 1724  . sevelamer carbonate (RENVELA) tablet 3,200 mg  3,200 mg Oral TID WC LoveIvan Anchors, PA-C   3,200 mg at 04/28/20 1724  . simethicone (MYLICON) chewable tablet 80 mg  80 mg Oral QID PRN Love, Pamela S, PA-C      . traZODone (DESYREL) tablet 25 mg  25 mg Oral QHS Bary Leriche, PA-C   25 mg at 04/28/20 2143  . Warfarin - Pharmacist Dosing Inpatient   Does not apply [id number] Flora Lipps         Physical Exam: Vitals:   04/29/20 0445 04/29/20 1001  BP: (!) 143/88   Pulse: (!) 59 90  Resp: 18   Temp: 98 F (36.7 C)   SpO2: 96%    Total I/O In: 120 [P.O.:120] Out: -   Intake/Output Summary (Last 24 hours) at 04/29/2020 1141 Last data filed at 04/29/2020 0901 Gross per 24 hour  Intake 480 ml  Output --  Net 480 ml   Constitutional: NAD, lying on bed CV: S1S2 no rub Respiratory: clear and unlabored Gastrointestinal: soft, non-tender, nondistended Ext: RLE absent below the knee, lue avf +b/t neuro: alert and oriented x3 conversant and follows commands Psych normal mood and affect   Test Results I personally reviewed new and old clinical labs and radiology tests Lab Results  Component Value Date   NA 133 (L) 04/27/2020   K 4.4 04/27/2020   CL 91 (L) 04/27/2020   CO2 25 04/27/2020   BUN 68 (H) 04/27/2020   CREATININE 11.24 (H) 04/27/2020   GLU 98 04/14/2019   CALCIUM 10.3 04/27/2020   ALBUMIN 2.7 (L) 04/27/2020   PHOS 5.5 (H) 04/27/2020    Jeremy Treloar Tanna Furry, MD 04/29/2020  11:41 AM

## 2020-04-29 NOTE — Progress Notes (Signed)
Physical Therapy Session Note  Patient Details  Name: Jeremy [name] MRN: [id number] Date of Birth: 04-Jun-1955  Today's Date: 04/29/2020 PT Individual Time: 1352-1448 PT Individual Time Calculation (min): 56 min   Short Term Goals: Week 1:  PT Short Term Goal 1 (Week 1): STG = LTG due to ELOS  Skilled Therapeutic Interventions/Progress Updates:     Patient in bed upon PT arrival. Patient alert and agreeable to PT session. Patient reported 6-7/10 R residual limb pain during session, RN made aware. PT provided repositioning, rest breaks, and distraction as pain interventions throughout session.   Therapeutic Activity: Bed Mobility: Patient performed supine to/from sit with supervision for safety. Provided verbal cues for safety awareness with R leg during mobility. Patient donned/doffed limb guard independently during session and able to teach back purpose and benefits of wearing the limb guard from previous session. Transfers: Patient performed squat pivot transfers bed<>w/c and w/c<>mat table with supervision-min A for w/c set-up and CGA for balance/safety during transfer. Provided cues for head-hips relationship and hand placement for improved hip clearance during transfers. He performed sit to/from stand x3 with min a progressing to [place] using RW. Provided verbal cues for placing his L hand back to stand and sit for increased L weight shift during mobility. Required stabilization of RW for safety x2.   Gait Training:  Patient ambulated 1 step, 2 steps, then 3 steps, forward and backward using RW focused on technique for hop-to gait pattern with good L foot clearance and control at initial contact. Provided cues for use of upper extremities and back for improved control with gait. He then ambulated 20 feet with 2 180 deg turns and 10 feet with 1 90 deg turn using RW with min A-CGA for balance. Ambulated with hop-to gait pattern on R with only intermittent occurences of lose of control at initial  contact with fatigue. Provided verbal cues for L weight shift for COM over COG, shoulder depression for increased scapular muscular and latissimus activation for reduced shoulder strain, and foot placement between his hands when landing for improved balance and control of swing.  Wheelchair Mobility:  Patient propelled wheelchair >150 feet x2 with supervision. Provided verbal cues for turning technique and min-mod cues for w/c parts management throughout. Provided patient with w/c gloves during session for improved grip and skin integrity. Educated on pressure relief techniques. Patient demonstrated w/c push-up, but unable to sustain hold due to fatigue. Then performed lateral leans 5x5 sec with good gluteal clearance. Educated on performing pressure relief at least every 30 min using lateral leans 5x5 sec R/L. Patient stated understanding.    Patient with decreased activity tolerance requiring frequent rest breaks throughout session. Educate don amputee recovery over the spectrum of care, prosthesis type and therapy process, adaptive hand controls for returning to driving if appropriate, and transfers for car and dialysis chair. Patient also reported that his ramps arrived and are bing installed and tested today.   Patient in bed at end of session with breaks locked, bed alarm set, and all needs within reach.    Therapy Documentation Precautions:  Precautions Precautions: Fall Precaution Comments: R BKA Restrictions Weight Bearing Restrictions: Yes RLE Weight Bearing: Non weight bearing   Therapy/Group: Individual Therapy  Tayte Mcwherter L Alf Doyle PT, DPT  04/29/2020, 4:03 PM

## 2020-04-29 NOTE — Progress Notes (Signed)
Occupational Therapy Session Note  Patient Details  Name: Jeremy [name] MRN: [id number] Date of Birth: [birth year]  Today's Date: 04/29/2020 OT Individual Time: 0730-0830 OT Individual Time Calculation (min): 60 min    Short Term Goals: Week 1:  OT Short Term Goal 1 (Week 1): STGs=LTGs due to ELOS  Skilled Therapeutic Interventions/Progress Updates:    Pt received supine with 4/10 residual limb pain, possibly from gauze pulling on staples pt reports, no request for intervention. Pt agreeable to try shower this morning. Discussed home set up and use of TTB with walk in shower with threshold, as well as benefit of hand held shower head. He completed squat pivot transfers at [facility] level throughout session with assist required for set up of transfer and w/c positioning. Pt's R residual limb was occluded for shower. He completed bathing seated on TTB with supervision overall. Discussed home to water proof limb at home if staples are not yet removed when he d/c. Pt completed transfer back to w/c with CGA. He dried off completely with min A and edu was provided re safety at home and fall risk reduction. Pt completed oral care and grooming at the sink with set up assist. Discussed participation in IADLs at home and prosthetic prep. Pants and underwear donned with supervision with lateral leans from EOB. Shirt mod I. Discussed importance of wearing shrinker and pt was agreeable for OT to don. Pt worried he may not have the hand strength to don the shrinker himself- recommended he practice on limb protector with spare shrinker, pt agreeable. Pt propelled w/c 100 ft to the therapy gym where he completed 5 min on the BUE ergometer, intended to increase strength and endurance of the BUE for carryover to ADLs and IADLs. Pt required rest break following and returned to his room. He transferred back to supine and was left with all needs met, bed alarm set.   Therapy Documentation Precautions:  Precautions Precautions:  Fall Precaution Comments: R BKA Restrictions Weight Bearing Restrictions: Yes RLE Weight Bearing: Non weight bearing   Therapy/Group: Individual Therapy  Curtis Sites 04/29/2020, 6:39 AM

## 2020-04-29 NOTE — Progress Notes (Signed)
Patient said he will wear his home CPAP by himself.

## 2020-04-30 ENCOUNTER — Inpatient Hospital Stay (HOSPITAL_COMMUNITY): Payer: Medicare Other

## 2020-04-30 LAB — RENAL FUNCTION PANEL
Albumin: 2.7 g/dL — ABNORMAL LOW (ref 3.5–5.0)
Anion gap: 14 (ref 5–15)
BUN: 78 mg/dL — ABNORMAL HIGH (ref 8–23)
CO2: 24 mmol/L (ref 22–32)
Calcium: 10.1 mg/dL (ref 8.9–10.3)
Chloride: 93 mmol/L — ABNORMAL LOW (ref 98–111)
Creatinine, Ser: 11.43 mg/dL — ABNORMAL HIGH (ref 0.61–1.24)
GFR, Estimated: 5 mL/min — ABNORMAL LOW (ref >60)
Glucose, Bld: 109 mg/dL — ABNORMAL HIGH (ref 70–99)
Phosphorus: 5.4 mg/dL — ABNORMAL HIGH (ref 2.5–4.6)
Potassium: 4.6 mmol/L (ref 3.5–5.1)
Sodium: 131 mmol/L — ABNORMAL LOW (ref 135–145)

## 2020-04-30 LAB — CBC
HCT: 25.4 % — ABNORMAL LOW (ref 39.0–52.0)
Hemoglobin: 8.1 g/dL — ABNORMAL LOW (ref 13.0–17.0)
MCH: 34.2 pg — ABNORMAL HIGH (ref 26.0–34.0)
MCHC: 31.9 g/dL (ref 30.0–36.0)
MCV: 107.2 fL — ABNORMAL HIGH (ref 80.0–100.0)
Platelets: 199 10*3/uL (ref 150–400)
RBC: 2.37 MIL/uL — ABNORMAL LOW (ref 4.22–5.81)
RDW: 15.5 % (ref 11.5–15.5)
WBC: 7.8 10*3/uL (ref 4.0–10.5)
nRBC: 0 % (ref 0.0–0.2)

## 2020-04-30 LAB — PROTIME-INR
INR: 2.8 — ABNORMAL HIGH (ref 0.8–1.2)
Prothrombin Time: 28.9 seconds — ABNORMAL HIGH (ref 11.4–15.2)

## 2020-04-30 MED ORDER — HEPARIN SODIUM (PORCINE) 1000 UNIT/ML DIALYSIS
1000.0000 [IU] | INTRAMUSCULAR | Status: DC | PRN
  Filled 2020-04-30: qty 1

## 2020-04-30 MED ORDER — WARFARIN 0.5 MG HALF TABLET
0.5000 mg | ORAL_TABLET | Freq: Once | ORAL | Status: AC
  Administered 2020-04-30: 0.5 mg via ORAL
  Filled 2020-04-30: qty 1

## 2020-04-30 MED ORDER — ALTEPLASE 2 MG IJ SOLR
2.0000 mg | Freq: Once | INTRAMUSCULAR | Status: DC | PRN
  Filled 2020-04-30: qty 2

## 2020-04-30 MED ORDER — SODIUM CHLORIDE 0.9 % IV SOLN: 100.0000 mL | INTRAVENOUS | Status: DC | PRN

## 2020-04-30 MED ORDER — PENTAFLUOROPROP-TETRAFLUOROETH EX AERO: 1.0000 "application " | INHALATION_SPRAY | CUTANEOUS | Status: DC | PRN

## 2020-04-30 MED ORDER — LIDOCAINE HCL (PF) 1 % IJ SOLN: 5.0000 mL | INTRAMUSCULAR | Status: DC | PRN

## 2020-04-30 MED ORDER — LIDOCAINE-PRILOCAINE 2.5-2.5 % EX CREA
1.0000 "application " | TOPICAL_CREAM | CUTANEOUS | Status: DC | PRN
  Filled 2020-04-30: qty 5

## 2020-04-30 NOTE — Progress Notes (Signed)
[place] PHYSICAL MEDICINE & REHABILITATION PROGRESS NOTE  Subjective/Complaints: Patient seen laying in bed this morning working with therapies.  He states he slept well overnight.  He states that a good weekend.  Discussed dressings with therapies.   ROS: Denies CP, SOB, N/V/D  Objective: Vital Signs: Blood pressure 130/80, pulse 76, temperature 98.2 F (36.8 C), resp. rate 17, height 5\' 10"  (1.778 m), weight 72 kg, SpO2 99 %. No results found. No results for input(s): WBC, HGB, HCT, PLT in the last 72 hours. No results for input(s): NA, K, CL, CO2, GLUCOSE, BUN, CREATININE, CALCIUM in the last 72 hours.  Intake/Output Summary (Last 24 hours) at 04/30/2020 1213 Last data filed at 04/30/2020 0715 Gross per 24 hour  Intake 478 ml  Output -  Net 478 ml        Physical Exam: BP 130/80 (BP Location: Right Arm)   Pulse 76   Temp 98.2 F (36.8 C)   Resp 17   Ht 5\' 10"  (1.778 m)   Wt 72 kg   SpO2 99%   BMI 22.78 kg/m  Constitutional: No distress . Vital signs reviewed. HENT: Normocephalic.  Atraumatic. Eyes: EOMI. No discharge. Cardiovascular: No JVD.  + Irregular. + Murmur Respiratory: Normal effort.  No stridor.   GI: Non-distended.   Skin: Warm and dry.   Right BKA with staples CDI, erythema improving Indentations,?  From stump protector Psych: Normal mood.  Normal behavior. Musc: Improving edema and tenderness to stump Neuro: Alert Motor: Bilateral upper extremities: 5/5 proximal distal Left lower extremity: 5/5 proximal distal, stable Right lower extremity: Hip flexion 4/5, knee extension 4/5, stable  Assessment/Plan: 1. Functional deficits which require 3+ hours per day of interdisciplinary therapy in a comprehensive inpatient rehab setting.  Physiatrist is providing close team supervision and 24 hour management of active medical problems listed below.  Physiatrist and rehab team continue to assess barriers to discharge/monitor patient progress toward  functional and medical goals   Care Tool:  Bathing    Body parts bathed by patient: Right arm,Left arm,Left upper leg,Chest,Abdomen,Front perineal area,Buttocks,Right upper leg,Face,Left lower leg     Body parts n/a: Right lower leg (BKA)   Bathing assist Assist Level: Supervision/Verbal cueing     Upper Body Dressing/Undressing Upper body dressing   What is the patient wearing?: Pull over shirt    Upper body assist Assist Level: Set up assist    Lower Body Dressing/Undressing Lower body dressing      What is the patient wearing?: Underwear/pull up,Pants,Orthosis     Lower body assist Assist for lower body dressing: Supervision/Verbal cueing (bed level, lateral leans)     Toileting Toileting    Toileting assist Assist for toileting: Minimal Assistance - Patient > 75%     Transfers Chair/bed transfer  Transfers assist     Chair/bed transfer assist level: Contact Guard/Touching assist Chair/bed transfer assistive device: Armrests   Locomotion Ambulation   Ambulation assist   Ambulation activity did not occur: Safety/medical concerns  Assist level: Minimal Assistance - Patient > 75% Assistive device: Walker-rolling Max distance: 20 ft   Walk 10 feet activity   Assist  Walk 10 feet activity did not occur: Safety/medical concerns  Assist level: Minimal Assistance - Patient > 75% Assistive device: Walker-rolling   Walk 50 feet activity   Assist Walk 50 feet with 2 turns activity did not occur: Safety/medical concerns         Walk 150 feet activity   Assist Walk 150 feet  activity did not occur: Safety/medical concerns         Walk 10 feet on uneven surface  activity   Assist Walk 10 feet on uneven surfaces activity did not occur: Safety/medical concerns         Wheelchair     Assist Will patient use wheelchair at discharge?: Yes Type of Wheelchair: Manual    Wheelchair assist level: Supervision/Verbal cueing Max  wheelchair distance: >150 ft    Wheelchair 50 feet with 2 turns activity    Assist        Assist Level: Supervision/Verbal cueing   Wheelchair 150 feet activity     Assist      Assist Level: Supervision/Verbal cueing    Medical Problem List and Plan: 1.Impaired function and gait and 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  Causing some pressure areas, will need to monitor. 2.Mechanical AVR/Antithrombotics: -DVT/anticoagulation:Pharmaceutical:Coumadin (goal 2.5-3.5)  INR therapeutic on 1/24 -antiplatelet therapy: N/A 3. Pain Management:Continue robaxin 500 mg tid and oxycodone prnis effective.  Controlled on 1/24  Monitor with increased exertion 4. Mood:LCSW to follow for evaluation and support. -antipsychotic agents: N/A 5. Neuropsych: This patientiscapable of making decisions on hisown behalf. 6. Skin/Wound Care:Monitor wound for healing. Prosource to help promote wound healing.  Continue nonadherent with stump shrinker 7. Fluids/Electrolytes/Nutrition:Strict I/Os. Renal diet with 1200 cc /FR. 8. Renal cancer s/p B-nephrectomy/ESRD:  Schedule HD- usually M/W/F-at the end of the day to help with tolerance of therapy   Renevala with meals to manage metabolic bone disease.  9. A fib: Monitor HR tid- on coumadin.   Continue Metoprolol bid.   Rate controlled on 1/24  Monitor with increased activity 10. Anemia of chronic disease/macrocytosis: On aranesp every Monday for supplementation. Monitor for signs of bleeding.   Hemoglobin 8.8 on 1/21, labs with HD  Vitamin B12/folate ordered with next of labs, not drawn yet 11. GERD: continue Protonix with simethicone prn.  12. CAD s/p CABG: On Crestor and metoprolol. Monitor for symptoms with activity.  Plavix resumed after discussion with vascular 13. OSA: Continue CPAP 14.  RA  Continue Plaquenil and prednisone  15.  Fever:  Resolved  COVID-negative  LOS: 7 days A FACE TO FACE EVALUATION WAS PERFORMED  Ankit Lorie Phenix 04/30/2020, 12:13 PM

## 2020-04-30 NOTE — Progress Notes (Signed)
Patient ID: Jeremy [name], male   DOB: 02/22/1956, 65 y.o.   MRN: [id number] Have ordered DME for pt to be delivered to room prior to discharge.

## 2020-04-30 NOTE — Progress Notes (Signed)
Nephrology Follow-Up Consult Note   Assessment/Recommendations: Jeremy [name] is a/an 65 y.o. male with a past medical history significant for ESRD on HD admitted for RLE amputation.   # ESRD: MWF schedule, outpatient unit: [facility]. 3.75hrs, 2K, 2.25Ca, Na 137, Bicarbonate 35, Dialyzer: F180.  - HD on 1/21 as per MWF schedule, tolerated well, UF 1 L. Next HD on 1/24, HD ordered confirmed with staff.   # Volume/ hypertension: trend post weights with HD. BP ok.   # Anemia of Chronic Kidney Disease: was on Epo 8000 units 3x weekly as an outpatient. Increased Aranesp to 100 mcg.  # Secondary Hyperparathyroidism/Hyperphosphatemia: DC calcitriol  because of elevated caclium level. monitor phos level, on sevelamer.   # Vascular access: L FA AVF with no issues  # RLE ischemia. 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  Pain control and dressing changes per primary. In CIR until 1/27  # Mechanical aortic valve: on warfarin outpt, heparin bridge here, mgmt per primary   # Fevers, resolved: mgmt per primary service. note bcx drawn given h/o mechanical aortic valve-neg. Possible cavitary appearing structure on cxr, ct chest does not reveal a cavitary lesion.  _____________________________________________________  CC: ESRD  Interval History/Subjective:  No new event. Denies nausea vomiting chest pain shortness of breath.   Medications:  Current Facility-Administered Medications  Medication Dose Route Frequency Provider Last Rate Last Admin  . (feeding supplement) PROSource Plus liquid 30 mL  30 mL Oral BID BM Love, Pamela S, PA-C   30 mL at 04/30/20 1027  . acetaminophen (TYLENOL) tablet 500 mg  500 mg Oral Q4H PRN Bary Leriche, PA-C   500 mg at 04/28/20 1920  . allopurinol (ZYLOPRIM) tablet 100 mg  100 mg Oral Daily Bary Leriche, PA-C   100 mg at 04/30/20 1029  . aluminum hydroxide (AMPHOJEL/ALTERNAGEL) suspension 10 mL  10 mL Oral Q6H PRN Love, Pamela S, PA-C      . bisacodyl (DULCOLAX)  suppository 10 mg  10 mg Rectal Daily PRN Love, Pamela S, PA-C      . chlorhexidine (PERIDEX) 0.12 % solution 15 mL  15 mL Mouth Rinse BID Jamse Arn, MD   15 mL at 04/30/20 1032  . Chlorhexidine Gluconate Cloth 2 % PADS 6 each  6 each Topical Q0600 Rosita Fire, MD      . clopidogrel (PLAVIX) tablet 75 mg  75 mg Oral Daily Bary Leriche, PA-C   75 mg at 04/30/20 1030  . [START ON 05/02/2020] Darbepoetin Alfa (ARANESP) injection 100 mcg  100 mcg Intravenous Q Wed-HD Rosita Fire, MD      . diphenhydrAMINE (BENADRYL) 12.5 MG/5ML elixir 12.5-25 mg  12.5-25 mg Oral Q6H PRN Love, Pamela S, PA-C      . ezetimibe (ZETIA) tablet 10 mg  10 mg Oral Daily Love, Pamela S, PA-C   10 mg at 04/30/20 1030  . guaiFENesin-dextromethorphan (ROBITUSSIN DM) 100-10 MG/5ML syrup 5-10 mL  5-10 mL Oral Q6H PRN Love, Pamela S, PA-C      . hydroxychloroquine (PLAQUENIL) tablet 400 mg  400 mg Oral QHS Love, Pamela S, PA-C   400 mg at 04/29/20 2124  . lamoTRIgine (LAMICTAL) tablet 50 mg  50 mg Oral BID Reesa Chew S, PA-C   50 mg at 04/30/20 1031  . MEDLINE mouth rinse  15 mL Mouth Rinse q12n4p Jamse Arn, MD   15 mL at 04/29/20 1457  . methocarbamol (ROBAXIN) tablet 500 mg  500 mg  Oral TID Bary Leriche, PA-C   500 mg at 04/30/20 1031  . metoprolol tartrate (LOPRESSOR) tablet 75 mg  75 mg Oral BID Bary Leriche, PA-C   75 mg at 04/29/20 2124  . milk and molasses enema  1 enema Rectal Daily PRN Love, Pamela S, PA-C      . oxyCODONE-acetaminophen (PERCOCET/ROXICET) 5-325 MG per tablet 1-2 tablet  1-2 tablet Oral Q4H PRN Bary Leriche, PA-C   2 tablet at 04/30/20 1036  . pantoprazole (PROTONIX) EC tablet 40 mg  40 mg Oral Daily Bary Leriche, PA-C   40 mg at 04/30/20 1031  . polyethylene glycol (MIRALAX / GLYCOLAX) packet 17 g  17 g Oral Daily Bary Leriche, PA-C   17 g at 04/30/20 1026  . polyethylene glycol (MIRALAX / GLYCOLAX) packet 17 g  17 g Oral Daily PRN Love, Pamela S, PA-C      .  predniSONE (DELTASONE) tablet 5 mg  5 mg Oral Daily Love, Ivan Anchors, PA-C   5 mg at 04/30/20 1033  . prochlorperazine (COMPAZINE) tablet 5-10 mg  5-10 mg Oral Q6H PRN Bary Leriche, PA-C   10 mg at 04/30/20 1157   Or  . prochlorperazine (COMPAZINE) injection 5-10 mg  5-10 mg Intramuscular Q6H PRN Love, Pamela S, PA-C       Or  . prochlorperazine (COMPAZINE) suppository 12.5 mg  12.5 mg Rectal Q6H PRN Love, Pamela S, PA-C      . rosuvastatin (CRESTOR) tablet 40 mg  40 mg Oral q1800 Bary Leriche, PA-C   40 mg at 04/29/20 1736  . sevelamer carbonate (RENVELA) tablet 3,200 mg  3,200 mg Oral TID WC Love, Ivan Anchors, PA-C   3,200 mg at 04/30/20 1029  . simethicone (MYLICON) chewable tablet 80 mg  80 mg Oral QID PRN Love, Pamela S, PA-C      . traZODone (DESYREL) tablet 25 mg  25 mg Oral QHS Bary Leriche, PA-C   25 mg at 04/29/20 2125  . warfarin (COUMADIN) tablet 0.5 mg  0.5 mg Oral ONCE-1600 Hammons, Theone Murdoch, RPH      . Warfarin - Pharmacist Dosing Inpatient   Does not apply [id number] Flora Lipps         Physical Exam: Vitals:   04/29/20 1957 04/30/20 0439  BP: 131/70 130/80  Pulse: 100 76  Resp: 16 17  Temp: 98.4 F (36.9 C) 98.2 F (36.8 C)  SpO2: 98% 99%   Total I/O In: 118 [P.O.:118] Out: -   Intake/Output Summary (Last 24 hours) at 04/30/2020 1307 Last data filed at 04/30/2020 0715 Gross per 24 hour  Intake 478 ml  Output --  Net 478 ml   Constitutional: NAD, lying on bed CV: S1S2 no rub Respiratory: clear and unlabored Gastrointestinal: soft, non-tender, nondistended Ext: RLE absent below the knee, lue avf +b/t neuro: alert and oriented x3 conversant and follows commands Psych normal mood and affect   Test Results I personally reviewed new and old clinical labs and radiology tests Lab Results  Component Value Date   NA 133 (L) 04/27/2020   K 4.4 04/27/2020   CL 91 (L) 04/27/2020   CO2 25 04/27/2020   BUN 68 (H) 04/27/2020   CREATININE 11.24 (H) 04/27/2020    GLU 98 04/14/2019   CALCIUM 10.3 04/27/2020   ALBUMIN 2.7 (L) 04/27/2020   PHOS 5.5 (H) 04/27/2020    Justin Mend, MD 04/30/2020  1:07 PM

## 2020-04-30 NOTE — Progress Notes (Signed)
ANTICOAGULATION CONSULT NOTE - Follow Up Consult  Pharmacy Consult for Warfarin Indication: atrial fibrillation, mechanical AVR  Allergies  Allergen Reactions  . Statins Other (See Comments)    Arthralgias (intolerance), Myalgias (intolerance)  Pt taking pravastatin   . Gabapentin     Drowsy   . Sertraline Rash    Patient Measurements: Height: 5\' 10"  (177.8 cm) Weight: 72 kg (158 lb 11.7 oz) IBW/kg (Calculated) : 73 kg  Vital Signs: Temp: 98.2 F (36.8 C) (01/24 0439) BP: 130/80 (01/24 0439) Pulse Rate: 76 (01/24 0439)  Labs: Recent Labs    04/28/20 0618 04/29/20 0446 04/30/20 0551  LABPROT 31.1* 33.0* 28.9*  INR 3.1* 3.4* 2.8*    Estimated Creatinine Clearance: 6.9 mL/min (A) (by C-G formula based on SCr of 11.24 mg/dL (H)).   Assessment: 65 yo male on warfarin for mechanical AVR/afib now s/p R BKA. INR was 2 on admission (PTA warfarin dose 1 mg daily), down from 10.2 on 1/3. Warfarin was held for BKA but when restarted at 0.5 mg on 1/10, INR increased to 4.7. Vitamin K 1 mg PO given on 1/12 with INR down to goal 3.1. INR is extremely sensitive to 0.5-1 mg doses. Last warfarin dose on 1/14.  INR today is 2.8.  The patient's diet has increased, he is eating 80-100% of meals. No new interacting medications have been initiated.  Goal of Therapy:  INR goal 2.5-3.5 (per outpatient anti-coag visit notes in chart review) Monitor platelets per protocol : Yes    Plan:  - Warfarin 0.5mg  PO x 1 today - Anticipate patient may need to be discharged on 0.5mg  two days per week on discharge - Maybe Monday and Thursday? - Monitor daily PT/INR and weekly [place], Pharm.D., BCPS Clinical Pharmacist Clinical phone for 04/30/2020 from 8:30-4:00 is [phone].  **Pharmacist phone directory can be found on [email or web address] listed under [email or web address].  04/30/2020 9:43 AM

## 2020-04-30 NOTE — Progress Notes (Signed)
Physical Therapy Session Note  Patient Details  Name: Jeremy [name] MRN: [id number] Date of Birth: [birth year]  Today's Date: 04/30/2020 PT Individual Time: 8309-4076 PT Individual Time Calculation (min): 72 min   Short Term Goals: Week 1:  PT Short Term Goal 1 (Week 1): STG = LTG due to ELOS  Skilled Therapeutic Interventions/Progress Updates:    Patient in supine and reports working already today on w/c mobility and ramps and upper body strength.  Supine to sit with S and donned shoe and adjusted shrinker seated EOB with S.  Transfer to w/c via squat pivot with CGA.  Patient set up w/c with cues and increased time.  Propelled w/c to therapy gym x 150' with S.  Set up for transfer to mat with cues and pivot transfer CGA.  Patient seated for hamstring stretch on R with leg on the mat 3 x 20 sec, then sit to supine with S and completed therex as noted below.  Patient supine to sit with S and donned limb protector with S.  Patient transferred via stand pivot with RW to w/c with CGA.  Issued BKA exercises to do in supine per pt request.   Propelled in w/c after set up with S to ortho gym with S.  Sit to stand at counter and 1 hand supported on counter for cone stacking with CGA.  Min A to negotiate ramp in w/c x 20'.  Propelled to room with S and transfer to bed after w/c set up with S and cues and CGA.  Patient left in supine with call bell in reach and bed alarm active.  RN informed pt requesting medication for nausea.   Therapy Documentation Precautions:  Precautions Precautions: Fall Precaution Comments: R BKA Restrictions Weight Bearing Restrictions: Yes RLE Weight Bearing: Non weight bearing Pain: Pain Assessment Pain Scale: 0-10 Pain Score: 7  Pain Location: Leg Pain Orientation: Right Pain Descriptors / Indicators: Aching Pain Frequency: Intermittent Patients Stated Pain Goal: 4 Pain Intervention(s): Medication (See eMAR) Exercises: General Exercises - Lower Extremity Long Arc  Quad: Right;10 reps;Seated Hip ABduction/ADduction: Strengthening;Sidelying;Both;10 reps Repetitive Sit to Stands: Two upper extremities (5 reps) Total Joint Exercises Quad Sets: Right;10 reps;Supine Bridges: Strengthening;Both;10 reps (with bolster under knees) Amputee Exercises Hip Extension: 10 reps;Both;Prone Knee Flexion: 10 reps;Both (prone) Straight Leg Raises: Both;10 reps;Supine Chair Push Up: Both;10 reps;Seated    Therapy/Group: Individual Therapy  Reginia Naas  [place], PT 04/30/2020, 11:28 AM

## 2020-04-30 NOTE — Progress Notes (Signed)
Occupational Therapy Session Note  Patient Details  Name: Jeremy [name] MRN: [id number] Date of Birth: Jul 24, 1955  Today's Date: 04/30/2020 OT Individual Time: 1030-1314 OT Individual Time Calculation (min): 55 min    Short Term Goals: Week 1:  OT Short Term Goal 1 (Week 1): STGs=LTGs due to ELOS  Skilled Therapeutic Interventions/Progress Updates:    Pt received supine with 3/10 residual limb pain, no request for intervention. Pt wearing only gauze on residual limb, discussed need to change dressing daily. Gauze removed and MD entered room to look at limb as well. Non-adherent pad and shrinker donned with max A. Pt reports he has not had pain medication this morning so good improvement on desensitization of limb in general during donning of shrinker. Pt donned L shoe with set up assist EOB. Squat pivot transfer to the w/c with supervision. Oral care and grooming tasks with mod I from w/c level. Pt propelled w/c to the therapy gym with supervision. He completed activity focused on functional mobility with the RW and management, especially turning in tight spaces for simulation of small home spaces, I.e. bathroom. He required CGA overall for 2 trials. Seated rest break between. Pt then transitioned to prone on the mat with edu provided re positioning and residual limb management. He completed 2 sets of glute activation with hip extension exercises to strengthen posterior chain and residual limb positioning for future prosthesis. Pt returned to his room and was left sitting up with all needs met.   Therapy Documentation Precautions:  Precautions Precautions: Fall Precaution Comments: R BKA Restrictions Weight Bearing Restrictions: Yes RLE Weight Bearing: Non weight bearing   Therapy/Group: Individual Therapy  Curtis Sites 04/30/2020, 7:03 AM

## 2020-04-30 NOTE — Progress Notes (Signed)
Physical Therapy Session Note  Patient Details  Name: Jeremy [name] MRN: [id number] Date of Birth: June 25, 1955  Today's Date: 04/30/2020 PT Individual Time: 0900-0956 PT Individual Time Calculation (min): 56 min   Short Term Goals: Week 1:  PT Short Term Goal 1 (Week 1): STG = LTG due to ELOS  Skilled Therapeutic Interventions/Progress Updates:    Pt greeted sitting upright in w/c, agreeable to therapy. No reports of pain. Pt with several questions regarding upcoming DC. Lengthy discussion regarding all topics of DC planning including DME rec's, role of f/u therapies, home safety, home entrance, and BKA ed.   Pt propelled himself mod I in w/c, 158ft, from room to ortho gym. He requires minA for managing leg rests as he has difficulty finding the lever. Attempted to apply red colored tape to assist with identifying lever with color and texture but tape would not stick to part.   Performed gait training, 2x59ft (seated rest) with CGA and RW. Demo's hop-to gait pattern with appropriate foot clearance while wearing a supportive shoe on his LLE. Required cues for safety approach to sitting and mild difficulty noted with hopping backwards.   Performed car transfers with car height set to simulate [place] CR-V. Initial trial performed with RW via stand<>pivot method which he completed with CGA. Educated on squat<>pivot technique to improve indep and efficiency. He performed x2 reps of squat<>pivot transfer from w/c to car, requiring CGA/minA with max cues for sequencing and technique. Pt prefer's squat<>pivot method but will require further training.  W/c training up/down 55ft ramp twice, requiring minA for both ascending and descending and managing threshold. Education on "clamping" brakes during descent to avoid uncontrolled speed for safety. Also educated on importance of gloves for skin protection and gripping.   He propelled himself mod I back to his room in w/c and performed squat<>pivot with minA  from w/c to EOB, towards his R side. Sit<>supine mod I with bed features.   He remained semi-reclined in bed with limb guard on, needs in reach, bed alarm on. Pt appreciative.  Therapy Documentation Precautions:  Precautions Precautions: Fall Precaution Comments: R BKA Restrictions Weight Bearing Restrictions: Yes RLE Weight Bearing: Non weight bearing  Therapy/Group: Individual Therapy  Gaylan Fauver P Lenardo Westwood PT 04/30/2020, 7:35 AM

## 2020-04-30 NOTE — Progress Notes (Signed)
Patient wearing home CPAP when RT entered room. No respiratory distress noted. RT will monitor as needed.

## 2020-05-01 ENCOUNTER — Encounter (HOSPITAL_COMMUNITY): Payer: Medicare Other | Admitting: Occupational Therapy

## 2020-05-01 ENCOUNTER — Ambulatory Visit (HOSPITAL_COMMUNITY): Payer: Medicare Other

## 2020-05-01 ENCOUNTER — Inpatient Hospital Stay (HOSPITAL_COMMUNITY): Payer: Medicare Other

## 2020-05-01 DIAGNOSIS — G8918 Other acute postprocedural pain: Secondary | ICD-10-CM

## 2020-05-01 LAB — PROTIME-INR
INR: 2.8 — ABNORMAL HIGH (ref 0.8–1.2)
Prothrombin Time: 28.3 seconds — ABNORMAL HIGH (ref 11.4–15.2)

## 2020-05-01 LAB — GLUCOSE, CAPILLARY: Glucose-Capillary: 88 mg/dL (ref 70–99)

## 2020-05-01 NOTE — Progress Notes (Signed)
[facility] PHYSICAL MEDICINE & REHABILITATION PROGRESS NOTE  Subjective/Complaints: Patient seen laying in bed this morning.  He states he slept well overnight.  He states he is doing well.  He states he is back on his regular dialysis schedule.  He states he was seen by vascular this AM.  He was seen by nephrology yesterday, notes reviewed-no changes.  ROS: Denies CP, SOB, N/V/D  Objective: Vital Signs: Blood pressure 119/61, pulse 88, temperature 98.5 F (36.9 C), temperature source Oral, resp. rate 16, height 5\' 10"  (1.778 m), weight 72.1 kg, SpO2 100 %. No results found. Recent Labs    04/30/20 1400  WBC 7.8  HGB 8.1*  HCT 25.4*  PLT 199   Recent Labs    04/30/20 1400  NA 131*  K 4.6  CL 93*  CO2 24  GLUCOSE 109*  BUN 78*  CREATININE 11.43*  CALCIUM 10.1    Intake/Output Summary (Last 24 hours) at 05/01/2020 1220 Last data filed at 05/01/2020 0710 Gross per 24 hour  Intake 413 ml  Output 2000 ml  Net -1587 ml        Physical Exam: BP 119/61 (BP Location: Right Arm)   Pulse 88   Temp 98.5 F (36.9 C) (Oral)   Resp 16   Ht 5\' 10"  (1.778 m)   Wt 72.1 kg   SpO2 100%   BMI 22.81 kg/m  Constitutional: No distress . Vital signs reviewed. HENT: Normocephalic.  Atraumatic. Eyes: EOMI. No discharge. Cardiovascular: No JVD.  Irregular + Murmur. Respiratory: Normal effort.  No stridor.  Bilateral clear to auscultation. GI: Non-distended.  BS +. Skin: Warm and dry.   Skin: Warm and dry.   Right BKA with dressing CDI Psych: Normal mood.  Normal behavior. Musc: No edema in extremities.  No tenderness in extremities. Neuro: Alert Motor: Bilateral upper extremities: 5/5 proximal distal Left lower extremity: 5/5 proximal distal, stable Right lower extremity: Hip flexion 4/5, knee extension 4/5, unchanged  Assessment/Plan: 1. Functional deficits which require 3+ hours per day of interdisciplinary therapy in a comprehensive inpatient rehab  setting.  Physiatrist is providing close team supervision and 24 hour management of active medical problems listed below.  Physiatrist and rehab team continue to assess barriers to discharge/monitor patient progress toward functional and medical goals   Care Tool:  Bathing    Body parts bathed by patient: Right arm,Left arm,Left upper leg,Chest,Abdomen,Front perineal area,Buttocks,Right upper leg,Face,Left lower leg     Body parts n/a: Right lower leg (BKA)   Bathing assist Assist Level: Supervision/Verbal cueing     Upper Body Dressing/Undressing Upper body dressing   What is the patient wearing?: Pull over shirt    Upper body assist Assist Level: Set up assist    Lower Body Dressing/Undressing Lower body dressing      What is the patient wearing?: Underwear/pull up,Pants,Orthosis     Lower body assist Assist for lower body dressing: Supervision/Verbal cueing (bed level, lateral leans)     Toileting Toileting    Toileting assist Assist for toileting: Minimal Assistance - Patient > 75%     Transfers Chair/bed transfer  Transfers assist     Chair/bed transfer assist level: Contact Guard/Touching assist Chair/bed transfer assistive device: Programmer, multimedia   Ambulation assist   Ambulation activity did not occur: Safety/medical concerns  Assist level: Minimal Assistance - Patient > 75% Assistive device: Walker-rolling Max distance: 20 ft   Walk 10 feet activity   Assist  Walk 10 feet activity  did not occur: Safety/medical concerns  Assist level: Minimal Assistance - Patient > 75% Assistive device: Walker-rolling   Walk 50 feet activity   Assist Walk 50 feet with 2 turns activity did not occur: Safety/medical concerns         Walk 150 feet activity   Assist Walk 150 feet activity did not occur: Safety/medical concerns         Walk 10 feet on uneven surface  activity   Assist Walk 10 feet on uneven surfaces activity  did not occur: Safety/medical concerns         Wheelchair     Assist Will patient use wheelchair at discharge?: Yes Type of Wheelchair: Manual    Wheelchair assist level: Supervision/Verbal cueing Max wheelchair distance: >150 ft    Wheelchair 50 feet with 2 turns activity    Assist        Assist Level: Supervision/Verbal cueing   Wheelchair 150 feet activity     Assist      Assist Level: Supervision/Verbal cueing    Medical Problem List and Plan: 1.Impaired function and gait and 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  Causing some pressure areas, will need to monitor. 2.Mechanical AVR/Antithrombotics: -DVT/anticoagulation:Pharmaceutical:Coumadin (goal 2.5-3.5)  INR therapeutic on 1/25 -antiplatelet therapy: N/A 3. Pain Management:Continue robaxin 500 mg tid and oxycodone prnis effective.  Controlled on 1/25  Monitor with increased exertion 4. Mood:LCSW to follow for evaluation and support. -antipsychotic agents: N/A 5. Neuropsych: This patientiscapable of making decisions on hisown behalf. 6. Skin/Wound Care:Monitor wound for healing. Prosource to help promote wound healing.  Continue nonadherent with stump shrinker 7. Fluids/Electrolytes/Nutrition:Strict I/Os. Renal diet with 1200 cc /FR. 8. Renal cancer s/p B-nephrectomy/ESRD:  Schedule HD- usually M/W/F-at the end of the day to help with tolerance of therapy   Renevala with meals to manage metabolic bone disease.  9. A fib: Monitor HR tid- on coumadin.   Continue Metoprolol bid.   Rate controlled on 1/25  Monitor with increased activity 10. Anemia of chronic disease/macrocytosis: On aranesp every Monday for supplementation. Monitor for signs of bleeding.   Hemoglobin 8.1 on 1/24, labs with HD  Vitamin B12/folate ordered with next of labs, not drawn yet 11. GERD: continue Protonix with simethicone prn.  12. CAD s/p  CABG: On Crestor and metoprolol. Monitor for symptoms with activity.  Plavix resumed after discussion with vascular 13. OSA: Continue CPAP 14.  RA  Continue Plaquenil and prednisone  15.  Fever: Resolved  COVID-negative  LOS: 8 days A FACE TO FACE EVALUATION WAS PERFORMED  Orlin Kann Lorie Phenix 05/01/2020, 12:20 PM

## 2020-05-01 NOTE — Progress Notes (Addendum)
Physical Therapy Session Note  Patient Details  Name: Jeremy [name] MRN: [id number] Date of Birth: [birth year]  Today's Date: 05/01/2020 PT Individual Time:Session1: 0907-1000; Arthor Captain: [id number] PT Individual Time Calculation (min): 53 min & 70 min  Short Term Goals: Week 1:  PT Short Term Goal 1 (Week 1): STG = LTG due to ELOS  Skilled Therapeutic Interventions/Progress Updates:    Session1:  Patient in supine and not sure when his girlfriend coming for training today.  Reports thought she could come whenever.  Patient supine to sit mod I and seated to don shoe and limb protector with S, mod A to don shrinker.  Patient performed bed to chair transfer S after w/c set up.  Cues for application of legrests and managing brakes, armrest after transfer.  Propelled throughout session supervision on level tile surfaces >150'.  Performed car transfer with cues and CGA first pivot transfer, then stand pivot with RW and CGA to simulated small SUV height.  Patient propelled w/c to therapy gym and ambulated x 20' x 2 with CGA with RW c/o feeling numbness in L knee at times from when he had hematoma in groin.  Patient negotiated 4" step pushing walker over step with min A.  Patient called significant other to inform to come in for OT session and stay for pm PT session.  Performed standing balance activity at [facility] with L UE support tapping targets with R over full screen with CGA, noted L LE shaky and pt c/o fatigue.  Propelled in w/c to room and left up in chair with OT session to start imminently.    [place]:  Patient seen with girlfriend present for caregiver education.  She returned home measurement worksheet and discussed concern for his favorite chair height (16.5").  Educated can use w/c cushion to add height if needed.  She reports pt having more difficulty with vertigo today and refusing to eat due to nausea.  Seated EOB for vestibular screen.  Reports vertigo intermittent for long time, but seems more  recent.  Had spinning in the past which gradually resolved on its own, but not really positional. Patient able to perform smooth pursuits and saccades, but noting slowed vertical saccades.  Noted no gaze holding nystagmus or other oculomotor difficulty.  Performed VOR horizontal and vertical reporting symptoms with both 2-3/10 horizontal and slightly more with vertical with some difficulty with target maintenance.  Patient unable to perform head thrust test due to cervical stiffness and guarding.  Hearing equal and intact to scratch test.  VOR cancellation WNL.  Sidelying test for positional vertigo negative both sides.  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  Patient encouraged to use visual targeting during mobility to improve symptoms.  Performed pivot transfer to w/c with S educating girlfriend on w/c set up and level of assist for pt.  Patient propelled to ortho gym with S and performed transfer with girlfriend watching via squat pivot as in earlier session.  Demonstrated w/c breakdown to place in car and set up.  Discussed using RW for stand pivot if needed to sit on electric scooter on the curb to avoid having to negotiate curb.  Patient performed transfer onto tub bench with CGA (reported did not have time to see with OT earlier) and girlfriend verbalized understanding.  Educated and she performed bumping w/c up on curb x 2 and educated  in how to move anit-tippers and she return demonstrated.  Patient ambulated 55' with RW and girlfriend assisting with CGA.  Patient negotiated curb step with RW and max A for balance using reverse technique after demonstration.  Discussed not safe to perform currently with 1 person assist and plans to either bump w/c up step or use scooter and drive car up to have pt stand turn with RW to sit on scooter while up on curb.  Pt. Assisted in w/c  to room and left in w/c with girlfriend in the room.    Therapy Documentation Precautions:  Precautions Precautions: Fall Precaution Comments: R BKA Restrictions Weight Bearing Restrictions: Yes RLE Weight Bearing: Non weight bearing Pain: Pain Assessment Pain Scale: 0-10 Pain Score: 6  Pain Type: Surgical pain Pain Location: Leg Pain Orientation: Right Pain Descriptors / Indicators: Sore Pain Onset: On-going Patients Stated Pain Goal: 5 Pain Intervention(s): Rest   Therapy/Group: Individual Therapy  Reginia Naas  Magda Kiel, PT 05/01/2020, 9:57 AM

## 2020-05-01 NOTE — Progress Notes (Signed)
  Progress Note    05/01/2020 8:50 AM * No surgery found *  Subjective:  Doing well in rehab.  Patient and RN noticed some dark bloody drainage from mid BKA    Vitals:   04/30/20 2006 05/01/20 0525  BP: (!) 141/80 119/61  Pulse: (!) 110 88  Resp: 16 16  Temp: 98.2 F (36.8 C) 98.5 F (36.9 C)  SpO2: 100% 100%   Physical Exam: Lungs:  Non labored Incisions:  R BKA incision with dark skin edges, skin breakdown mid incision, no palpable fluctuance Neurologic: A&O      CBC    Component Value Date/Time   WBC 7.8 04/30/2020 1400   RBC 2.37 (L) 04/30/2020 1400   HGB 8.1 (L) 04/30/2020 1400   HGB 10.0 (L) 08/30/2014 1013   HCT 25.4 (L) 04/30/2020 1400   HCT 32.2 (L) 08/30/2014 1013   PLT 199 04/30/2020 1400   PLT 125 (L) 08/30/2014 1013   MCV 107.2 (H) 04/30/2020 1400   MCV 88.2 09/16/2014 1041   MCV 89 08/30/2014 1013   MCH 34.2 (H) 04/30/2020 1400   MCHC 31.9 04/30/2020 1400   RDW 15.5 04/30/2020 1400   RDW 14.0 08/30/2014 1013   LYMPHSABS 1.2 10/03/2019 0348   LYMPHSABS 0.8 08/30/2014 1013   MONOABS 0.4 10/03/2019 0348   EOSABS 0.1 10/03/2019 0348   EOSABS 0.1 08/30/2014 1013   BASOSABS 0.0 10/03/2019 0348   BASOSABS 0.0 08/30/2014 1013    BMET    Component Value Date/Time   NA 131 (L) 04/30/2020 1400   NA 143 04/14/2019 0000   K 4.6 04/30/2020 1400   CL 93 (L) 04/30/2020 1400   CO2 24 04/30/2020 1400   GLUCOSE 109 (H) 04/30/2020 1400   BUN 78 (H) 04/30/2020 1400   BUN 20 04/14/2019 0000   CREATININE 11.43 (H) 04/30/2020 1400   CALCIUM 10.1 04/30/2020 1400   CALCIUM 9.2 12/14/2015 1059   GFRNONAA 5 (L) 04/30/2020 1400   GFRAA 8 (L) 12/23/2019 0858    INR    Component Value Date/Time   INR 2.8 (H) 05/01/2020 0539   INR 2.3 03/27/2020 0000   INR 2.3 03/27/2020 0000     Intake/Output Summary (Last 24 hours) at 05/01/2020 0850 Last data filed at 05/01/2020 0710 Gross per 24 hour  Intake 413 ml  Output 2000 ml  Net -1587 ml      Assessment/Plan:  65 y.o. male is s/p R BKA  Dressing changed today; continue daily changes Skin edges darkening with wound breakdown mid incision, likely mobilizing hematoma If mid incision opens, will switch to wet to dry Will ask Dr. Carlis Abbott to evaluate prior to d/c on 05/03/20   Dagoberto Ligas, PA-C [facility] [phone] 05/01/2020 8:50 AM

## 2020-05-01 NOTE — Progress Notes (Signed)
Nephrology Follow-Up Consult Note   Assessment/Recommendations: Jeremy [name] is a/an 65 y.o. male with a past medical history significant for ESRD on HD admitted for RLE amputation.   # ESRD: MWF schedule, outpatient unit: [facility]. 3.75hrs, 2K, 2.25Ca, Na 137, Bicarbonate 35, Dialyzer: F180.  - HD tomorrow per schedule.  # Volume/ hypertension: trend post weights with HD. BP ok.   # Anemia of Chronic Kidney Disease: was on Epo 8000 units 3x weekly as an outpatient. Increased Aranesp to 100 mcg.  # Secondary Hyperparathyroidism/Hyperphosphatemia: DC calcitriol  because of elevated caclium level. monitor phos level, on sevelamer.   # Vascular access: L FA AVF with no issues  # RLE ischemia. 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  Pain control and dressing changes per primary. In CIR until 1/27  # Mechanical aortic valve: on warfarin outpt, heparin bridge here, mgmt per primary   # Fevers, resolved: mgmt per primary service. note bcx drawn given h/o mechanical aortic valve-neg. Possible cavitary appearing structure on cxr, ct chest does not reveal a cavitary lesion.  _____________________________________________________  CC: ESRD  Interval History/Subjective:  No new event. Denies nausea vomiting chest pain shortness of breath.   Medications:  Current Facility-Administered Medications  Medication Dose Route Frequency Provider Last Rate Last Admin  . (feeding supplement) PROSource Plus liquid 30 mL  30 mL Oral BID BM Love, Ivan Anchors, PA-C   30 mL at 05/01/20 0826  . acetaminophen (TYLENOL) tablet 500 mg  500 mg Oral Q4H PRN Bary Leriche, PA-C   500 mg at 04/28/20 1920  . allopurinol (ZYLOPRIM) tablet 100 mg  100 mg Oral Daily Bary Leriche, PA-C   100 mg at 05/01/20 5093  . aluminum hydroxide (AMPHOJEL/ALTERNAGEL) suspension 10 mL  10 mL Oral Q6H PRN Love, Pamela S, PA-C      . bisacodyl (DULCOLAX) suppository 10 mg  10 mg Rectal Daily PRN Love, Pamela S, PA-C      . chlorhexidine  (PERIDEX) 0.12 % solution 15 mL  15 mL Mouth Rinse BID Jamse Arn, MD   15 mL at 05/01/20 0813  . Chlorhexidine Gluconate Cloth 2 % PADS 6 each  6 each Topical Q0600 Rosita Fire, MD      . clopidogrel (PLAVIX) tablet 75 mg  75 mg Oral Daily Bary Leriche, PA-C   75 mg at 05/01/20 0816  . [START ON 05/02/2020] Darbepoetin Alfa (ARANESP) injection 100 mcg  100 mcg Intravenous Q Wed-HD Rosita Fire, MD      . diphenhydrAMINE (BENADRYL) 12.5 MG/5ML elixir 12.5-25 mg  12.5-25 mg Oral Q6H PRN Love, Pamela S, PA-C      . ezetimibe (ZETIA) tablet 10 mg  10 mg Oral Daily Bary Leriche, PA-C   10 mg at 05/01/20 2671  . guaiFENesin-dextromethorphan (ROBITUSSIN DM) 100-10 MG/5ML syrup 5-10 mL  5-10 mL Oral Q6H PRN Love, Pamela S, PA-C      . hydroxychloroquine (PLAQUENIL) tablet 400 mg  400 mg Oral QHS Love, Pamela S, PA-C   400 mg at 04/30/20 2138  . lamoTRIgine (LAMICTAL) tablet 50 mg  50 mg Oral BID Bary Leriche, PA-C   50 mg at 05/01/20 2458  . MEDLINE mouth rinse  15 mL Mouth Rinse q12n4p Jamse Arn, MD   15 mL at 04/29/20 1457  . methocarbamol (ROBAXIN) tablet 500 mg  500 mg Oral TID Bary Leriche, PA-C   500 mg at 05/01/20 0998  . metoprolol tartrate (LOPRESSOR)  tablet 75 mg  75 mg Oral BID Bary Leriche, PA-C   75 mg at 05/01/20 3888  . milk and molasses enema  1 enema Rectal Daily PRN Love, Pamela S, PA-C      . oxyCODONE-acetaminophen (PERCOCET/ROXICET) 5-325 MG per tablet 1-2 tablet  1-2 tablet Oral Q4H PRN Bary Leriche, PA-C   2 tablet at 05/01/20 1213  . pantoprazole (PROTONIX) EC tablet 40 mg  40 mg Oral Daily Bary Leriche, PA-C   40 mg at 05/01/20 0813  . polyethylene glycol (MIRALAX / GLYCOLAX) packet 17 g  17 g Oral Daily Bary Leriche, PA-C   17 g at 05/01/20 0813  . polyethylene glycol (MIRALAX / GLYCOLAX) packet 17 g  17 g Oral Daily PRN Love, Pamela S, PA-C      . predniSONE (DELTASONE) tablet 5 mg  5 mg Oral Daily Love, Ivan Anchors, PA-C   5 mg at  05/01/20 2800  . prochlorperazine (COMPAZINE) tablet 5-10 mg  5-10 mg Oral Q6H PRN Bary Leriche, PA-C   10 mg at 05/01/20 1209   Or  . prochlorperazine (COMPAZINE) injection 5-10 mg  5-10 mg Intramuscular Q6H PRN Love, Pamela S, PA-C       Or  . prochlorperazine (COMPAZINE) suppository 12.5 mg  12.5 mg Rectal Q6H PRN Love, Pamela S, PA-C      . rosuvastatin (CRESTOR) tablet 40 mg  40 mg Oral q1800 Bary Leriche, PA-C   40 mg at 04/30/20 1810  . sevelamer carbonate (RENVELA) tablet 3,200 mg  3,200 mg Oral TID WC LoveIvan Anchors, PA-C   3,200 mg at 05/01/20 0813  . simethicone (MYLICON) chewable tablet 80 mg  80 mg Oral QID PRN Love, Pamela S, PA-C      . traZODone (DESYREL) tablet 25 mg  25 mg Oral QHS Bary Leriche, PA-C   25 mg at 04/30/20 2028  . Warfarin - Pharmacist Dosing Inpatient   Does not apply q1600 Bary Leriche, PA-C   Given at 04/30/20 1809     Physical Exam: Vitals:   05/01/20 0525 05/01/20 1615  BP: 119/61 110/77  Pulse: 88 80  Resp: 16 17  Temp: 98.5 F (36.9 C) 98.1 F (36.7 C)  SpO2: 100% 100%   Total I/O In: 236 [P.O.:236] Out: -   Intake/Output Summary (Last 24 hours) at 05/01/2020 1650 Last data filed at 05/01/2020 0710 Gross per 24 hour  Intake 413 ml  Output 2000 ml  Net -1587 ml   Constitutional: NAD, lying on bed CV: S1S2 no rub Respiratory: clear and unlabored Gastrointestinal: soft, non-tender, nondistended Ext: RLE absent below the knee, lue avf +b/t neuro: alert and oriented x3 conversant and follows commands Psych normal mood and affect   Test Results I personally reviewed new and old clinical labs and radiology tests Lab Results  Component Value Date   NA 131 (L) 04/30/2020   K 4.6 04/30/2020   CL 93 (L) 04/30/2020   CO2 24 04/30/2020   BUN 78 (H) 04/30/2020   CREATININE 11.43 (H) 04/30/2020   GLU 98 04/14/2019   CALCIUM 10.1 04/30/2020   ALBUMIN 2.7 (L) 04/30/2020   PHOS 5.4 (H) 04/30/2020    Justin Mend, MD 05/01/2020   4:50 PM

## 2020-05-01 NOTE — Progress Notes (Signed)
ANTICOAGULATION CONSULT NOTE - Follow Up Consult  Pharmacy Consult for Warfarin Indication: atrial fibrillation, mechanical AVR  Allergies  Allergen Reactions  . Statins Other (See Comments)    Arthralgias (intolerance), Myalgias (intolerance)  Pt taking pravastatin   . Gabapentin     Drowsy   . Sertraline Rash    Patient Measurements: Height: 5\' 10"  (177.8 cm) Weight: 72.1 kg (158 lb 15.2 oz) IBW/kg (Calculated) : 73 kg  Vital Signs: Temp: 98.5 F (36.9 C) (01/25 0525) Temp Source: Oral (01/25 0525) BP: 119/61 (01/25 0525) Pulse Rate: 88 (01/25 0525)  Labs: Recent Labs    04/29/20 0446 04/30/20 0551 04/30/20 1400 05/01/20 0539  HGB  --   --  8.1*  --   HCT  --   --  25.4*  --   PLT  --   --  199  --   LABPROT 33.0* 28.9*  --  28.3*  INR 3.4* 2.8*  --  2.8*  CREATININE  --   --  11.43*  --     Estimated Creatinine Clearance: 6.7 mL/min (A) (by C-G formula based on SCr of 11.43 mg/dL (H)).   Assessment: 65 yo male on warfarin for mechanical AVR/afib now s/p R BKA. INR was 2 on admission (PTA warfarin dose 1 mg daily), down from 10.2 on 1/3. Warfarin was held for BKA but when restarted at 0.5 mg on 1/10, INR increased to 4.7. Vitamin K 1 mg PO given on 1/12 with INR down to goal 3.1. INR is extremely sensitive to 0.5-1 mg doses. Last warfarin dose on 1/14, 1/25.  INR today remains 2.8.  Given patient's history of warfarin sensitivity, will not dose warfarin tonight.  Anticipate patient may only need warfarin 2 days per week at discharge.   Goal of Therapy:  INR goal 2.5-3.5 (per outpatient anti-coag visit notes in chart review) Monitor platelets per protocol : Yes    Plan:  - No warfarin today - Anticipate patient may need to be discharged on 0.5mg  two days per week on discharge - Maybe Mondays and Thursdays? - Monitor daily PT/INR and weekly CBC  Adina Puzzo, Pharm.D., BCPS Clinical Pharmacist Clinical phone for 05/01/2020 from 8:30-4:00 is  [phone].  **Pharmacist phone directory can be found on [email or web address] listed under [place].  05/01/2020 9:28 AM

## 2020-05-01 NOTE — Progress Notes (Signed)
Pt has home cpap and places self on/off as needed. RT will continue to monitor.

## 2020-05-01 NOTE — Progress Notes (Signed)
Occupational Therapy Session Note  Patient Details  Name: FRAZIER BALFOUR MRN: [id number] Date of Birth: [birth year]  Today's Date: 05/01/2020 OT Individual Time: 1003-1107 OT Individual Time Calculation (min): 64 min    Short Term Goals: Week 1:  OT Short Term Goal 1 (Week 1): STGs=LTGs due to ELOS  Skilled Therapeutic Interventions/Progress Updates:    Pt sitting up in w/c, reports he received pain medication recently; mild soreness distal residual limb reported.  Pt reports girlfriend on her way for family education.  OT session focused on functional transfer training with w/c mgt education, LLE self care, and RLE dressing.  Pt completed all functional transfers with close supervision including BSC<>w/c x 2 trials and tub bench <>w/c. Pt then completed donning/doffing of right limb protector with mod I and shrinker with min assist.  Pt reports he bathed at sink but did not wash his left foot today. Re-educated on LLE skin care importance and pt completed at sink with setup including washing, drying, applying lotion, and doffing/donning left sock and shoe.  Pts girlfriend arrived and participated actively in family education including functional squat pivot transfers w/c<>BSC and she provided close supervision with good body mechanics and safety awareness demonstrated.  Also educated girlfriend on assist technique for donning of shrinker and girlfriend return demonstrated with mod I.  Call bell in reach, seat alarm on.  Therapy Documentation Precautions:  Precautions Precautions: Fall Precaution Comments: R BKA Restrictions Weight Bearing Restrictions: Yes RLE Weight Bearing: Non weight bearing   Therapy/Group: Individual Therapy  Ezekiel Slocumb 05/01/2020, 12:04 PM

## 2020-05-02 ENCOUNTER — Inpatient Hospital Stay (HOSPITAL_COMMUNITY): Payer: Medicare Other

## 2020-05-02 ENCOUNTER — Inpatient Hospital Stay (HOSPITAL_COMMUNITY): Payer: Medicare Other | Admitting: Occupational Therapy

## 2020-05-02 ENCOUNTER — Inpatient Hospital Stay (HOSPITAL_COMMUNITY): Payer: Medicare Other | Admitting: Physical Therapy

## 2020-05-02 LAB — PROTIME-INR
INR: 2.9 — ABNORMAL HIGH (ref 0.8–1.2)
Prothrombin Time: 29.1 seconds — ABNORMAL HIGH (ref 11.4–15.2)

## 2020-05-02 NOTE — Progress Notes (Signed)
Evaluated [name] and he does have some skin changes to the staple line and anteriorly on the flap.  No signs of infection and okay for discharge from my standpoint.  We will follow this [name] as an outpatient.  He has follow-up on 2/15 in our office for staple removal.  Marty Heck, MD [facility] [place] Office: [place]

## 2020-05-02 NOTE — Progress Notes (Signed)
Inpatient Rehabilitation Care Coordinator Discharge Note  The overall goal for the admission was met for:   Discharge location: Yes-HOME WITH GIRLFRIEND WHO WORKS FROM HOME  Length of Stay: Yes-10 DAYS  Discharge activity level: Yes-MOD/I [facility] ASSIST WITH GAIT  Home/community participation: Yes  Services provided included: MD, RD, PT, OT, RN, CM, Pharmacy, Neuropsych and SW  Financial Services: Medicare and Private Insurance: [place] offered to/list presented to:YES  Follow-up services arranged: Home Health: [facility]-PT,OT, RN, DME: [facility] AND [place] and Patient/Family has no preference for HH/DME agencies  Comments (or additional information):GIRLFRIEND WAS IN FOR FAMILY TRAINING AND IT WENT WELL. BOTH FEEL PREPARED FOR DISCHARGE TODAY.  Patient/Family verbalized understanding of follow-up arrangements: Yes  Individual responsible for coordination of the follow-up plan: PATIENT-[id number]  Confirmed correct DME delivered: Elease Hashimoto 05/02/2020    Vickye Astorino, [place]

## 2020-05-02 NOTE — Progress Notes (Signed)
Pt HD tx moved to 05/03/2020, Miguel Aschoff, RN called and notified.

## 2020-05-02 NOTE — Progress Notes (Signed)
Occupational Therapy Discharge Summary  Patient Details  Name: Jeremy [name] MRN: [id number] Date of Birth: 11/29/1955  Today's Date: 05/02/2020 OT Individual Time: 1130-1200 OT Individual Time Calculation (min): 30 mins   Patient has met 7 of 9 long term goals due to improved activity tolerance, improved balance and ability to compensate for deficits.  Patient to discharge at overall Supervision level.  Patient's care partner is independent to provide the necessary physical and cognitive assistance at discharge.    Reasons goals not met: Pt requires VC's for safety and problem solving through w/c mgt and functional transfers despite repetitive skilled training.  Recommendation:  Patient will benefit from ongoing skilled OT services in home health setting to continue to advance functional skills in the area of BADL and functional transfers.  Equipment: BSC and shower bench  Reasons for discharge: treatment goals met and discharge from hospital  Patient/family agrees with progress made and goals achieved: Yes  OT Discharge Precautions/Restrictions  Precautions Precautions: Fall Precaution Comments: R BKA Required Braces or Orthoses: Other Brace Other Brace: limb protector Restrictions Weight Bearing Restrictions: Yes RLE Weight Bearing: Non weight bearing Pain Pain Assessment Pain Scale: 0-10 Pain Score: 3  Pain Type: Acute pain;Surgical pain Pain Location: Leg Pain Orientation: Right Pain Descriptors / Indicators: Sore;Tender Pain Onset: On-going Pain Intervention(s): Repositioned Multiple Pain Sites: No ADL ADL Eating: Independent Where Assessed-Eating: Wheelchair Grooming: Modified independent Where Assessed-Grooming: Sitting at sink Upper Body Bathing: Modified independent Where Assessed-Upper Body Bathing: Sitting at sink Lower Body Bathing: Modified independent Where Assessed-Lower Body Bathing: Shower,Sitting at sink Upper Body Dressing: Modified independent  (Device) Where Assessed-Upper Body Dressing: Sitting at sink Lower Body Dressing: Modified independent Where Assessed-Lower Body Dressing: Sitting at sink Toileting: Modified independent Where Assessed-Toileting: Bedside Commode Toilet Transfer: Close supervision Toilet Transfer Method: Engineer, water: Bedside commode Tub/Shower Transfer: Close supervison Clinical cytogeneticist Method: Engineer, production: Radio broadcast assistant Vision Baseline Vision/History: Wears glasses Wears Glasses: Reading only Patient Visual Report: No change from baseline Vision Assessment?: No apparent visual deficits Perception  Perception: Within Functional Limits Praxis Praxis: Intact Cognition Overall Cognitive Status: Within Functional Limits for tasks assessed Arousal/Alertness: Awake/alert Orientation Level: Oriented X4 Attention: Sustained Sustained Attention: Appears intact Selective Attention: Appears intact Memory: Impaired Memory Impairment: Retrieval deficit;Storage deficit Awareness: Appears intact Problem Solving: Appears intact Safety/Judgment: Appears intact Sensation Sensation Light Touch: Impaired Detail Light Touch Impaired Details: Impaired LLE;Impaired RLE Hot/Cold: Appears Intact Proprioception: Appears Intact Stereognosis: Not tested Coordination Gross Motor Movements are Fluid and Coordinated: No Fine Motor Movements are Fluid and Coordinated: Yes Coordination and Movement Description: Effortful due to debility and general deconditioning as well as s/p R BKA impacting movement patterns; however improved since IE Motor  Motor Motor: Other (comment) Motor - Skilled Clinical Observations: R BKA with balance strategies deficits; however improved since IE Mobility  Bed Mobility Bed Mobility: Rolling Right;Rolling Left;Supine to Sit;Sit to Supine Rolling Right: Independent Rolling Left: Independent Supine to Sit: Independent Sit to Supine:  Supervision/Verbal cueing  Trunk/Postural Assessment  Cervical Assessment Cervical Assessment: Within Functional Limits Thoracic Assessment Thoracic Assessment: Within Functional Limits Lumbar Assessment Lumbar Assessment: Within Functional Limits Postural Control Postural Control: Within Functional Limits  Balance Balance Balance Assessed: Yes Static Sitting Balance Static Sitting - Balance Support: No upper extremity supported Static Sitting - Level of Assistance: 7: Independent Dynamic Sitting Balance Dynamic Sitting - Balance Support: During functional activity Dynamic Sitting - Level of Assistance: 7: Independent Static Standing Balance Static Standing -  Balance Support: Bilateral upper extremity supported Static Standing - Level of Assistance: 5: Stand by assistance Extremity/Trunk Assessment RUE Assessment RUE Assessment: Within Functional Limits LUE Assessment LUE Assessment: Within Functional Limits   Ezekiel Slocumb 05/02/2020, 9:54 AM

## 2020-05-02 NOTE — Progress Notes (Signed)
Occupational Therapy Session Note  Patient Details  Name: Jeremy [name] MRN: [id number] Date of Birth: [birth year]  Today's Date: 05/02/2020 OT Individual Time: 0702-0757 OT Individual Time Calculation (min): 55 min    Short Term Goals: Week 1:  OT Short Term Goal 1 (Week 1): STGs=LTGs due to ELOS  Skilled Therapeutic Interventions/Progress Updates:     Pt received in bed with pt stating he has "a little" pain in residual limb, but not "bad" ADL:  Pt completes bathing with MOD I using lateral leans sitting on TTB for bathing. Reinforced education on skin ckeck of BLE and hygiene for LLE. Pt completes UB dressing with MOD I gathering clothing from seated level. Pt given VC for recall to lock brakes prior to reaching for low items. Pt completes LB dressing with MOD A at EOB using lateral leans after gathering clothing as stated avoe Pt completes footwear with MOD I to don shoe Pt completes toileting with MOD I using lateral leans for clothign management Pt completes toileting transfer with MOD I for squat pivot transfer with pt managing B arm rests on w/c and DAC Pt completes shower/Tub transfer with MOD I using squat pivot with no cuing needed for w/c parts management.  OT educated pt on figure 8 wrapping. Pt able to return demo onto a towel roll as residual limb pain increasing throughout session. Pt requires min VC for technique. Encouraged pt to practice after MD changes dresing. RN delivers pain medication at end of session.  Pt left at end of session in bed with exit alarm on, call light in reach and all needs met   Therapy Documentation Precautions:  Precautions Precautions: Fall Precaution Comments: R BKA Restrictions Weight Bearing Restrictions: Yes RLE Weight Bearing: Non weight bearing General:   Vital Signs: Therapy Vitals Temp: 98.7 F (37.1 C) Pulse Rate: 81 Resp: 18 BP: 128/77 Patient Position (if appropriate): Lying Oxygen Therapy SpO2: 100 % O2 Device:  Room Air Pain:   ADL: ADL Grooming: Setup Where Assessed-Grooming: Bed level Upper Body Bathing: Supervision/safety Where Assessed-Upper Body Bathing: Bed level Lower Body Bathing: Supervision/safety Where Assessed-Lower Body Bathing: Bed level Upper Body Dressing: Setup Where Assessed-Upper Body Dressing: Bed level Lower Body Dressing: Supervision/safety Where Assessed-Lower Body Dressing: Bed level Vision   Perception    Praxis   Exercises:   Other Treatments:     Therapy/Group: Individual Therapy  Tonny Branch 05/02/2020, 6:55 AM

## 2020-05-02 NOTE — Progress Notes (Signed)
[facility] PHYSICAL MEDICINE & REHABILITATION PROGRESS NOTE  Subjective/Complaints: Patient seen sitting up in a chair working with therapy this morning. States he slept well overnight. He was seen by nephrology yesterday, notes reviewed-no changes.  ROS: Denies CP, SOB, N/V/D  Objective: Vital Signs: Blood pressure 128/77, pulse 81, temperature 98.7 F (37.1 C), resp. rate 18, height 5\' 10"  (1.778 m), weight 73.3 kg, SpO2 100 %. No results found. Recent Labs    04/30/20 1400  WBC 7.8  HGB 8.1*  HCT 25.4*  PLT 199   Recent Labs    04/30/20 1400  NA 131*  K 4.6  CL 93*  CO2 24  GLUCOSE 109*  BUN 78*  CREATININE 11.43*  CALCIUM 10.1    Intake/Output Summary (Last 24 hours) at 05/02/2020 1030 Last data filed at 05/01/2020 1620 Gross per 24 hour  Intake 198 ml  Output --  Net 198 ml        Physical Exam: BP 128/77 (BP Location: Right Arm)   Pulse 81   Temp 98.7 F (37.1 C)   Resp 18   Ht 5\' 10"  (1.778 m)   Wt 73.3 kg   SpO2 100%   BMI 23.19 kg/m  Constitutional: No distress . Vital signs reviewed. HENT: Normocephalic.  Atraumatic. Eyes: EOMI. No discharge. Cardiovascular: No JVD. Irregular. + Murmur. Respiratory: Normal effort.  No stridor.  Bilateral clear to auscultation. GI: Non-distended.  BS +. Skin: Warm and dry.   Right BKA with staples, CDI-erythema along posterior flap darkening along staple lines. Psych: Normal mood.  Normal behavior. Musc: Right BKA with edema and tenderness, improving Neuro: Alert Motor: Bilateral upper extremities: 5/5 proximal distal Left lower extremity: 5/5 proximal distal, stable Right lower extremity: Hip flexion 4/5, knee extension 4/5, stable  Assessment/Plan: 1. Functional deficits which require 3+ hours per day of interdisciplinary therapy in a comprehensive inpatient rehab setting.  Physiatrist is providing close team supervision and 24 hour management of active medical problems listed below.  Physiatrist  and rehab team continue to assess barriers to discharge/monitor patient progress toward functional and medical goals   Care Tool:  Bathing    Body parts bathed by patient: Right arm,Left arm,Left upper leg,Chest,Abdomen,Front perineal area,Buttocks,Right upper leg,Face,Left lower leg     Body parts n/a: Right lower leg (BKA)   Bathing assist Assist Level: Independent with assistive device     Upper Body Dressing/Undressing Upper body dressing   What is the patient wearing?: Pull over shirt    Upper body assist Assist Level: Independent with assistive device    Lower Body Dressing/Undressing Lower body dressing      What is the patient wearing?: Underwear/pull up,Pants,Orthosis     Lower body assist Assist for lower body dressing: Independent with assitive device     Toileting Toileting    Toileting assist Assist for toileting: Independent with assistive device     Transfers Chair/bed transfer  Transfers assist     Chair/bed transfer assist level: Supervision/Verbal cueing Chair/bed transfer assistive device: Programmer, multimedia   Ambulation assist   Ambulation activity did not occur: Safety/medical concerns  Assist level: Contact Guard/Touching assist Assistive device: Walker-rolling Max distance: 20   Walk 10 feet activity   Assist  Walk 10 feet activity did not occur: Safety/medical concerns  Assist level: Contact Guard/Touching assist Assistive device: Walker-rolling   Walk 50 feet activity   Assist Walk 50 feet with 2 turns activity did not occur: Safety/medical concerns  Walk 150 feet activity   Assist Walk 150 feet activity did not occur: Safety/medical concerns         Walk 10 feet on uneven surface  activity   Assist Walk 10 feet on uneven surfaces activity did not occur: Safety/medical concerns         Wheelchair     Assist Will patient use wheelchair at discharge?: Yes Type of Wheelchair:  Manual    Wheelchair assist level: Supervision/Verbal cueing Max wheelchair distance: >150 ft    Wheelchair 50 feet with 2 turns activity    Assist        Assist Level: Supervision/Verbal cueing   Wheelchair 150 feet activity     Assist      Assist Level: Supervision/Verbal cueing    Medical Problem List and Plan: 1.Impaired function and gait and 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 Please see conference note from today as well.  2.Mechanical AVR/Antithrombotics: -DVT/anticoagulation:Pharmaceutical:Coumadin (goal 2.5-3.5)  INR therapeutic on 1/26 -antiplatelet therapy: N/A 3. Pain Management:Continue robaxin 500 mg tid and oxycodone prnis effective.  Controlled on 1/26  Monitor with increased exertion 4. Mood:LCSW to follow for evaluation and support. -antipsychotic agents: N/A 5. Neuropsych: This patientiscapable of making decisions on hisown behalf. 6. Skin/Wound Care:Monitor wound for healing. Prosource to help promote wound healing.  Continue Xeroform with Kerlix and stump shrinker 7. Fluids/Electrolytes/Nutrition:Strict I/Os. Renal diet with 1200 cc /FR. 8. Renal cancer s/p B-nephrectomy/ESRD:  Schedule HD- usually M/W/F-at the end of the day to help with tolerance of therapy   Renevala with meals to manage metabolic bone disease.  9. A fib: Monitor HR tid- on coumadin.   Continue Metoprolol bid.   Rate controlled on 1/26  Monitor with increased activity 10. Anemia of chronic disease/macrocytosis: On aranesp every Monday for supplementation. Monitor for signs of bleeding.   Hemoglobin 8.1 on 1/24, labs with HD  Vitamin B12/folate ordered with next of labs, not drawn yet 11.  GERD: continue Protonix with simethicone prn.  12. CAD s/p CABG: On Crestor and metoprolol. Monitor for symptoms with activity.  Plavix resumed after discussion with vascular 13. OSA: Continue CPAP 14.  RA  Continue Plaquenil and prednisone  15.  Fever: Resolved  COVID-negative  LOS: 9 days A FACE TO FACE EVALUATION WAS PERFORMED  Lakisa Lotz Lorie Phenix 05/02/2020, 10:30 AM

## 2020-05-02 NOTE — Progress Notes (Signed)
Patient ID: Jeremy [name], male   DOB: 1956/04/03, 65 y.o.   MRN: [id number] Met with pt to discuss discharge tomorrow and team conference results. Pt feels ready and looking forward to discharge tomorrow.

## 2020-05-02 NOTE — Progress Notes (Signed)
ANTICOAGULATION CONSULT NOTE - Follow Up Consult  Pharmacy Consult for Warfarin Indication: atrial fibrillation, mechanical AVR  Allergies  Allergen Reactions  . Statins Other (See Comments)    Arthralgias (intolerance), Myalgias (intolerance)  Pt taking pravastatin   . Gabapentin     Drowsy   . Sertraline Rash    Patient Measurements: Height: 5\' 10"  (177.8 cm) Weight: 73.3 kg (161 lb 9.6 oz) IBW/kg (Calculated) : 73 kg  Vital Signs: Temp: 98.7 F (37.1 C) (01/26 0629) BP: 128/77 (01/26 0629) Pulse Rate: 81 (01/26 0629)  Labs: Recent Labs    04/30/20 0551 04/30/20 1400 05/01/20 0539 05/02/20 0533  HGB  --  8.1*  --   --   HCT  --  25.4*  --   --   PLT  --  199  --   --   LABPROT 28.9*  --  28.3* 29.1*  INR 2.8*  --  2.8* 2.9*  CREATININE  --  11.43*  --   --     Estimated Creatinine Clearance: 6.8 mL/min (A) (by C-G formula based on SCr of 11.43 mg/dL (H)).   Assessment: 65 yo male on warfarin for mechanical AVR/afib now s/p R BKA. INR was 2 on admission (PTA warfarin dose 1 mg daily), down from 10.2 on 1/3. Warfarin was held for BKA but when restarted at 0.5 mg on 1/10, INR increased to 4.7. Vitamin K 1 mg PO given on 1/12 with INR down to goal 3.1. INR is extremely sensitive to 0.5-1 mg doses. Last warfarin dose on 1/14, 1/25.  INR today remains therapeutic at 2.9.  Given patient's history of warfarin sensitivity, will not dose warfarin tonight.  Anticipate patient may only need warfarin 2 days per week at discharge.   Goal of Therapy:  INR goal 2.5-3.5 (per outpatient anti-coag visit notes in chart review) Monitor platelets per protocol : Yes    Plan:  - No warfarin today - Anticipate patient may need to be discharged on 0.5mg  two days per week on discharge - suggest Mondays and Thursdays. - Monitor daily PT/INR and weekly CBC  Tyden Kann, Pharm.D., BCPS Clinical Pharmacist Clinical phone for 05/02/2020 from 8:30-4:00 is [phone].  **Pharmacist  phone directory can be found on [email or web address] listed under [facility].  05/02/2020 9:37 AM

## 2020-05-02 NOTE — Patient Care Conference (Signed)
Inpatient RehabilitationTeam Conference and Plan of Care Update Date: 05/02/2020   Time: 11:19 AM    Patient Name: Jeremy [name]      Medical Record Number: [id number]  Date of Birth: 1955-08-09 Sex: Male         Room/Bed: 4M07C/4M07C-01 Payor Info: Payor: MEDICARE / Plan: MEDICARE PART A AND B / Product Type: *No Product type* /    Admit Date/Time:  04/23/2020 11:38 PM  Primary Diagnosis:  Below-knee amputation of left lower extremity [facility])  Hospital Problems: Principal Problem:   Below-knee amputation of left lower extremity (HCC) Active Problems:   Anemia of chronic disease   Macrocytosis   ESRD on dialysis ([place])   Supratherapeutic INR   FUO (fever of unknown origin)   Rheumatoid arthritis ([place])   Postoperative pain    Expected Discharge Date: Expected Discharge Date: 05/03/20  Team Members Present: Physician leading conference: Dr. Delice Lesch Care Coodinator Present: Dorien Chihuahua, RN, BSN, CRRN;Becky Dupree, LCSW Nurse Present: Other (comment) Philip Aspen, RN) PT Present: Magda Kiel, PT OT Present: Leretha Pol, OT PPS Coordinator present : Gunnar Fusi, SLP     Current Status/Progress Goal Weekly Team Focus  Bowel/Bladder             Swallow/Nutrition/ Hydration             ADL's   supervision-mod I  mod I  self care training, skin integrity education, functional transfers, caregiver education, d/c planning   Mobility   S for transfers, w/c mobility and CGA short distance ambulation, 1 step with max A  S transfers, CGA car transfers, min A 15' ambulation, Indep w/c mobility controlled, min A 1 step  w/c management, safe transfers, caregiver education, standing balance/tolerance   Communication             Safety/Cognition/ Behavioral Observations            Pain             Skin               Discharge Planning:  Girlfriend was here for family training and it went well. Pt ready for discharge and has all DME   Team Discussion: Ischemic changes  to affected limb, surgeon following.  Patient on target to meet rehab goals: yes, currently mod I - supervision overall  *See Care Plan and progress notes for long and short-term goals.   Revisions to Treatment Plan:  Address wheelchair management issues  Teaching Needs: Transfers, toileting, medications, skin care, etc.   Current Barriers to Discharge: None  Possible Resolutions to Barriers: Family education completed 05/01/20     Medical Summary Current Status: Impaired function and gait and R BKA secondary to RLE gangrene  Barriers to Discharge: Medical stability;Weight bearing restrictions;Wound care;Hemodialysis   Possible Resolutions to [employer] Focus: Therapies, follow labs - Hb, follow HR, nephro recs, patient/family edu, wound   Continued Need for Acute Rehabilitation Level of Care: The patient requires daily medical management by a physician with specialized training in physical medicine and rehabilitation for the following reasons: Direction of a multidisciplinary physical rehabilitation program to maximize functional independence : Yes Medical management of patient stability for increased activity during participation in an intensive rehabilitation regime.: Yes Analysis of laboratory values and/or radiology reports with any subsequent need for medication adjustment and/or medical intervention. : Yes   I attest that I was present, lead the team conference, and concur with the assessment and plan of the team.  Dorien Chihuahua B 05/02/2020, 3:56 PM

## 2020-05-02 NOTE — Progress Notes (Signed)
Nephrology Follow-Up Consult Note   Assessment/Recommendations: Jeremy [name] is a/an 65 y.o. male with a past medical history significant for ESRD on HD admitted for RLE amputation.   # ESRD: MWF schedule, outpatient unit: [facility]. 3.75hrs, 2K, 2.25Ca, Na 137, Bicarbonate 35, Dialyzer: F180.  - HD today per schedule. -Will need new EDW at discharge.   # Volume/ hypertension: trend post weights with HD. BP ok.   # Anemia of Chronic Kidney Disease: was on Epo 8000 units 3x weekly as an outpatient. Increased Aranesp to 100 mcg. Hb in 8s.  # Secondary Hyperparathyroidism/Hyperphosphatemia: DCd calcitriol  because of elevated caclium level. monitor phos level, on sevelamer.   # Vascular access: L FA AVF with no issues  # RLE ischemia. 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  Pain control and dressing changes per primary. In CIR until 1/27  # Mechanical aortic valve: on warfarin outpt, heparin bridge here, mgmt per primary   # Fevers, resolved: mgmt per primary service. note bcx drawn given h/o mechanical aortic valve-neg. Possible cavitary appearing structure on cxr, ct chest does not reveal a cavitary lesion.  Dispo: plan d/c tomorrow then next HD at home unit.  _____________________________________________________  CC: ESRD  Interval History/Subjective:  No new event. Denies nausea vomiting chest pain shortness of breath.   Medications:  Current Facility-Administered Medications  Medication Dose Route Frequency Provider Last Rate Last Admin  . (feeding supplement) PROSource Plus liquid 30 mL  30 mL Oral BID BM Love, Pamela S, PA-C   30 mL at 05/02/20 1334  . acetaminophen (TYLENOL) tablet 500 mg  500 mg Oral Q4H PRN Bary Leriche, PA-C   500 mg at 04/28/20 1920  . allopurinol (ZYLOPRIM) tablet 100 mg  100 mg Oral Daily Bary Leriche, PA-C   100 mg at 05/02/20 0757  . aluminum hydroxide (AMPHOJEL/ALTERNAGEL) suspension 10 mL  10 mL Oral Q6H PRN Love, Pamela S, PA-C      . bisacodyl  (DULCOLAX) suppository 10 mg  10 mg Rectal Daily PRN Love, Pamela S, PA-C      . chlorhexidine (PERIDEX) 0.12 % solution 15 mL  15 mL Mouth Rinse BID Jamse Arn, MD   15 mL at 05/02/20 0802  . Chlorhexidine Gluconate Cloth 2 % PADS 6 each  6 each Topical Q0600 Rosita Fire, MD      . clopidogrel (PLAVIX) tablet 75 mg  75 mg Oral Daily Bary Leriche, PA-C   75 mg at 05/02/20 0759  . Darbepoetin Alfa (ARANESP) injection 100 mcg  100 mcg Intravenous Q Wed-HD Rosita Fire, MD      . diphenhydrAMINE (BENADRYL) 12.5 MG/5ML elixir 12.5-25 mg  12.5-25 mg Oral Q6H PRN Love, Pamela S, PA-C      . ezetimibe (ZETIA) tablet 10 mg  10 mg Oral Daily Bary Leriche, PA-C   10 mg at 05/02/20 0802  . guaiFENesin-dextromethorphan (ROBITUSSIN DM) 100-10 MG/5ML syrup 5-10 mL  5-10 mL Oral Q6H PRN Love, Pamela S, PA-C      . hydroxychloroquine (PLAQUENIL) tablet 400 mg  400 mg Oral QHS Love, Pamela S, PA-C   400 mg at 05/01/20 2219  . lamoTRIgine (LAMICTAL) tablet 50 mg  50 mg Oral BID Bary Leriche, PA-C   50 mg at 05/02/20 0758  . MEDLINE mouth rinse  15 mL Mouth Rinse q12n4p Jamse Arn, MD   15 mL at 05/02/20 1334  . methocarbamol (ROBAXIN) tablet 500 mg  500 mg  Oral TID Bary Leriche, PA-C   500 mg at 05/02/20 7412  . metoprolol tartrate (LOPRESSOR) tablet 75 mg  75 mg Oral BID Bary Leriche, PA-C   75 mg at 05/01/20 2102  . milk and molasses enema  1 enema Rectal Daily PRN Love, Pamela S, PA-C      . oxyCODONE-acetaminophen (PERCOCET/ROXICET) 5-325 MG per tablet 1-2 tablet  1-2 tablet Oral Q4H PRN Bary Leriche, PA-C   2 tablet at 05/02/20 0800  . pantoprazole (PROTONIX) EC tablet 40 mg  40 mg Oral Daily Bary Leriche, PA-C   40 mg at 05/02/20 0758  . polyethylene glycol (MIRALAX / GLYCOLAX) packet 17 g  17 g Oral Daily Bary Leriche, PA-C   17 g at 05/02/20 8786  . polyethylene glycol (MIRALAX / GLYCOLAX) packet 17 g  17 g Oral Daily PRN Love, Pamela S, PA-C      . predniSONE  (DELTASONE) tablet 5 mg  5 mg Oral Daily Love, Pamela S, PA-C   5 mg at 05/02/20 0800  . prochlorperazine (COMPAZINE) tablet 5-10 mg  5-10 mg Oral Q6H PRN Bary Leriche, PA-C   10 mg at 05/01/20 1209   Or  . prochlorperazine (COMPAZINE) injection 5-10 mg  5-10 mg Intramuscular Q6H PRN Love, Pamela S, PA-C       Or  . prochlorperazine (COMPAZINE) suppository 12.5 mg  12.5 mg Rectal Q6H PRN Love, Pamela S, PA-C      . rosuvastatin (CRESTOR) tablet 40 mg  40 mg Oral q1800 Bary Leriche, PA-C   40 mg at 05/01/20 1746  . sevelamer carbonate (RENVELA) tablet 3,200 mg  3,200 mg Oral TID WC LoveIvan Anchors, PA-C   3,200 mg at 05/02/20 1334  . simethicone (MYLICON) chewable tablet 80 mg  80 mg Oral QID PRN Love, Pamela S, PA-C      . traZODone (DESYREL) tablet 25 mg  25 mg Oral QHS Bary Leriche, PA-C   25 mg at 05/01/20 2107  . Warfarin - Pharmacist Dosing Inpatient   Does not apply q1600 Bary Leriche, PA-C   Given at 04/30/20 1809     Physical Exam: Vitals:   05/02/20 0629 05/02/20 1514  BP: 128/77 116/74  Pulse: 81 89  Resp: 18 18  Temp: 98.7 F (37.1 C) 98.1 F (36.7 C)  SpO2: 100% 100%   Total I/O In: 60 [P.O.:60] Out: -   Intake/Output Summary (Last 24 hours) at 05/02/2020 1519 Last data filed at 05/02/2020 0725 Gross per 24 hour  Intake 140 ml  Output -  Net 140 ml   Constitutional: NAD, lying on bed CV: S1S2 no rub Respiratory: clear and unlabored Gastrointestinal: soft, non-tender, nondistended Ext: RLE absent below the knee, lue avf +b/t neuro: alert and oriented x3 conversant and follows commands Psych normal mood and affect   Test Results I personally reviewed new and old clinical labs and radiology tests Lab Results  Component Value Date   NA 131 (L) 04/30/2020   K 4.6 04/30/2020   CL 93 (L) 04/30/2020   CO2 24 04/30/2020   BUN 78 (H) 04/30/2020   CREATININE 11.43 (H) 04/30/2020   GLU 98 04/14/2019   CALCIUM 10.1 04/30/2020   ALBUMIN 2.7 (L) 04/30/2020    PHOS 5.4 (H) 04/30/2020    Justin Mend, MD 05/02/2020  3:19 PM

## 2020-05-02 NOTE — Plan of Care (Signed)
  Problem: RH Balance Goal: LTG Patient will maintain dynamic sitting balance (PT) Description: LTG:  Patient will maintain dynamic sitting balance with assistance during mobility activities (PT) Outcome: Completed/Met Goal: LTG Patient will maintain dynamic standing balance (PT) Description: LTG:  Patient will maintain dynamic standing balance with assistance during mobility activities (PT) Outcome: Completed/Met   Problem: Sit to Stand Goal: LTG:  Patient will perform sit to stand with assistance level (PT) Description: LTG:  Patient will perform sit to stand with assistance level (PT) Outcome: Completed/Met   Problem: RH Bed Mobility Goal: LTG Patient will perform bed mobility with assist (PT) Description: LTG: Patient will perform bed mobility with assistance, with/without cues (PT). Outcome: Completed/Met   Problem: RH Bed to Chair Transfers Goal: LTG Patient will perform bed/chair transfers w/assist (PT) Description: LTG: Patient will perform bed to chair transfers with assistance (PT). Outcome: Not Met (add Reason) Note: Needing S for safety with transfers   Problem: RH Car Transfers Goal: LTG Patient will perform car transfers with assist (PT) Description: LTG: Patient will perform car transfers with assistance (PT). Outcome: Completed/Met   Problem: RH Ambulation Goal: LTG Patient will ambulate in home environment (PT) Description: LTG: Patient will ambulate in home environment, # of feet with assistance (PT). Outcome: Completed/Met   Problem: RH Wheelchair Mobility Goal: LTG Patient will propel w/c in controlled environment (PT) Description: LTG: Patient will propel wheelchair in controlled environment, # of feet with assist (PT) Outcome: Not Met (add Reason) Note: Patient does not have endurance to propel x 500' Goal: LTG Patient will propel w/c in home environment (PT) Description: LTG: Patient will propel wheelchair in home environment, # of feet with assistance  (PT). Outcome: Completed/Met Goal: LTG Patient will propel w/c in community environment (PT) Description: LTG: Patient will propel wheelchair in community environment, # of feet with assist (PT) Outcome: Not Met (add Reason) Note: Patient does not have endurance to propel x 500'   Problem: RH Stairs Goal: LTG Patient will ambulate up and down stairs w/assist (PT) Description: LTG: Patient will ambulate up and down # of stairs with assistance (PT) Outcome: Not Met (add Reason) Note: Patient needing max A to negotiate 1 step with reverse technique with RW.   Magda Kiel, PT

## 2020-05-02 NOTE — Progress Notes (Signed)
Physical Therapy Discharge Summary  Patient Details  Name: Jeremy [name] MRN: [id number] Date of Birth: 1955-07-30  Today's Date: 05/02/2020 PT Individual Time: 1000-1100 PT Individual Time Calculation (min): 60 min    Skilled Therapeutic Interventions/Progress Updates:  Patient in w/c with girlfriend present.  Noted w/c delivered to go home and reviewed differences with parts compared to chair he has used on rehab with pt and caregiver.  Patient concerned re:bed height at home.  Demonstrated and pt performed stand pivot w/ RW to bed '@34' " height using footstool with S.  Patient sit to supine and doffed limb protector to review and issues HEP including seated SAQ, supine glut sets, isometric hip extension and sidelying hip abduction then added bridge over pillow (bilateral), single limb bridge on L with leg lift on R, prone lying, prone hip extension, prone knee flexion, armchair pushups (after transfer to w/c with RW and S,) and sit to stand to RW all performed x 5.  Propelled in w/c to ortho gym and performed squat pivot to car with CGA with caregiver assisting.  Ambulated x 20' with RW and CGA of caregiver.  Propelled to room and left with caregiver in the room.  No further questions.   Patient has met 7 of 11 long term goals due to improved activity tolerance, improved balance, increased strength, decreased pain and ability to compensate for deficits.  Patient to discharge at a wheelchair level Supervision.   Patient's care partner is independent to provide the necessary physical assistance at discharge.  Reasons goals not met: Patient has limited endurance and can propel w/c 200' but not for community distances.  His girlfrie nd has obtained an electric scooter to utilize as needed in and out of the home.  Also was unable to negotiate 1 step with RW without max A due to balance and strength so girlfriend has demonstrated ability to bump w/c up on curb step and have discussed sitting on scooter on  curb from car via stand pivot with RW from ground level.  Patient needing S for w/c mobility and transfers for safety and girlfriend has demonstrated safe S level of assist.   Recommendation:  Patient will benefit from ongoing skilled PT services in home health setting to continue to advance safe functional mobility, address ongoing impairments in strength, balance and endurance, and minimize fall risk.  Equipment: wheelchair  Reasons for discharge: discharge from hospital  Patient/family agrees with progress made and goals achieved: Yes  PT Discharge Precautions/Restrictions Precautions Precautions: Fall Precaution Comments: R BKA Required Braces or Orthoses: Other Brace Other Brace: limb protector Restrictions Weight Bearing Restrictions: Yes RLE Weight Bearing: Non weight bearing Vital Signs   Pain Pain Assessment Pain Scale: 0-10 Pain Score: 3  Pain Type: Acute pain;Surgical pain Pain Location: Leg Pain Orientation: Right Pain Descriptors / Indicators: Sore;Tender Pain Onset: On-going Pain Intervention(s): Repositioned Multiple Pain Sites: No Vision/Perception  Perception Perception: Within Functional Limits Praxis Praxis: Intact  Cognition Overall Cognitive Status: Within Functional Limits for tasks assessed Arousal/Alertness: Awake/alert Orientation Level: Oriented X4 Attention: Sustained Sustained Attention: Appears intact Selective Attention: Appears intact Memory: Impaired Memory Impairment: Retrieval deficit;Storage deficit Awareness: Appears intact Problem Solving: Appears intact Safety/Judgment: Appears intact Sensation Sensation Light Touch: Impaired Detail Light Touch Impaired Details: Impaired LLE;Impaired RLE Hot/Cold: Appears Intact Proprioception: Appears Intact Stereognosis: Not tested Coordination Gross Motor Movements are Fluid and Coordinated: No Fine Motor Movements are Fluid and Coordinated: Yes Coordination and Movement  Description: Effortful due to debility and general deconditioning  as well as s/p R BKA impacting movement patterns; however improved since IE Motor  Motor Motor: Other (comment) Motor - Skilled Clinical Observations: R BKA with balance strategies deficits; however improved since IE Motor - Discharge Observations: Generalized weakness and impaired balance with R BKA  Mobility Bed Mobility Bed Mobility: Rolling Right;Rolling Left;Supine to Sit;Sit to Supine Rolling Right: Independent Rolling Left: Independent Supine to Sit: Independent Sit to Supine: Independent Transfers Transfers: Squat Pivot Transfers;Stand Pivot Transfers Sit to Stand: Supervision/Verbal cueing Stand to Sit: Supervision/Verbal cueing Stand Pivot Transfers: Supervision/Verbal cueing Stand Pivot Transfer Details (indicate cue type and reason): supervision for safety with RW Squat Pivot Transfers: Supervision/Verbal cueing Transfer (Assistive device): Rolling walker Locomotion  Gait Ambulation: Yes Gait Assistance: Contact Guard/Touching assist Gait Distance (Feet): 20 Feet Assistive device: Rolling walker Gait Gait: Yes Gait Pattern: Impaired Gait Pattern: Trunk flexed;Lateral hip instability;Decreased step length - left Stairs / Additional Locomotion Stairs: Yes Stairs Assistance: Maximal Assistance - Patient 25 - 49% Stair Management Technique: Backwards;With walker Number of Stairs: 1 Height of Stairs: 6 Wheelchair Mobility Wheelchair Mobility: Yes Wheelchair Assistance: Chartered loss adjuster: Both upper extremities Wheelchair Parts Management: Supervision/cueing Distance: 200  Trunk/Postural Assessment  Cervical Assessment Cervical Assessment: Exceptions to [facility] (forward head) Thoracic Assessment Thoracic Assessment: Exceptions to [facility] (increased kyphosis) Lumbar Assessment Lumbar Assessment: Within Functional Limits Postural Control Postural Control: Within Functional  Limits  Balance Balance Balance Assessed: Yes Static Sitting Balance Static Sitting - Balance Support: No upper extremity supported Static Sitting - Level of Assistance: 7: Independent Dynamic Sitting Balance Dynamic Sitting - Balance Support: During functional activity Dynamic Sitting - Level of Assistance: 7: Independent Static Standing Balance Static Standing - Balance Support: Bilateral upper extremity supported;Left upper extremity supported Static Standing - Level of Assistance: 5: Stand by assistance Dynamic Standing Balance Dynamic Standing - Balance Support: Bilateral upper extremity supported;Left upper extremity supported Dynamic Standing - Level of Assistance: 5: Stand by assistance Dynamic Standing - Balance Activities: Reaching for objects Extremity Assessment  RUE Assessment RUE Assessment: Within Functional Limits LUE Assessment LUE Assessment: Within Functional Limits RLE Assessment RLE Assessment: Exceptions to [facility] Active Range of Motion (AROM) Comments: AROM knee flexion about 80 General Strength Comments: R BKA LLE Assessment Active Range of Motion (AROM) Comments: WFL General Strength Comments: Grossly 4+/5    Jamison Oka, PT 05/02/2020, 12:25 PM

## 2020-05-02 NOTE — Progress Notes (Signed)
Physical Therapy Session Note  Patient Details  Name: Jeremy [name] MRN: [id number] Date of Birth: [birth year]  Today's Date: 05/02/2020 PT Individual Time: 7371-0626 PT Individual Time Calculation (min): 59 min   Short Term Goals: Week 1:  PT Short Term Goal 1 (Week 1): STG = LTG due to ELOS  Skilled Therapeutic Interventions/Progress Updates:    Pt received supine in bed and agreeable to therapy session though reporting fatigue from yesterdays sessions. Supine>sitting R EOB independently. Sitting EOB donned R LE limb protector without assist. Reinforced the following prior education with pt having difficulty recalling education despite question cuing:  - importance of maintaining full R LE knee extension ROM - importance of maintaining full R LE hip extension ROM via lying flat supine in bed or in prone for increased stretch if safe to do so - importance of daily L foot skin assessments (pt has already been provided mirror) Pt expressing concerns regarding getting up/down curb from parking area to house walkway - discussed having GF park car next to curb so when he puts L foot on the ground it will already be up on the curb as long as it does not cause large gap between the curb and the car. Pt reports wanting to use an electric scooter to get in/out of the house discussed flipping scooter foot plate up out of the way to allow safer access to the seat during transfers. L squat pivot EOB>w/c with supervision demoing good safety awareness to ensure w/c set-up and brakes locked prior to initiating transfer. B UE w/c propulsion ~119ft 2x to/from gym and ~145ft during session with distant supervision. Educated pt on technique to perform w/c turns in smaller spaces using forward push with 1 UE while simultaneously pulling backwards with opposite UE. Simulated car transfer (small SUV height) via squat pivot using L UE support on inside car handle and R UE on armrest to allow pt have leverage to pivot hips -  repeated 2x with supervision. MD in/out for morning assessment and to assess incision. Therapist assisted nursing in reapplying R LE bandage per MD instruction and shrinker with pt demoing good recall of shrinker education. At end of session, pt left sitting in w/c with needs in reach and seat belt alarm on.    Therapy Documentation Precautions:  Precautions Precautions: Fall Precaution Comments: R BKA Restrictions Weight Bearing Restrictions: Yes RLE Weight Bearing: Non weight bearing  Pain:  Reports pain 8/10 in R LE - reports medication administered ~27minutes prior to session - provided rest breaks for pain management.   Therapy/Group: Individual Therapy  Tawana Scale , PT, DPT, CSRS  05/02/2020, 9:17 AM

## 2020-05-03 LAB — RENAL FUNCTION PANEL
Albumin: 2.9 g/dL — ABNORMAL LOW (ref 3.5–5.0)
Anion gap: 17 — ABNORMAL HIGH (ref 5–15)
BUN: 66 mg/dL — ABNORMAL HIGH (ref 8–23)
CO2: 22 mmol/L (ref 22–32)
Calcium: 10.3 mg/dL (ref 8.9–10.3)
Chloride: 93 mmol/L — ABNORMAL LOW (ref 98–111)
Creatinine, Ser: 10.35 mg/dL — ABNORMAL HIGH (ref 0.61–1.24)
GFR, Estimated: 5 mL/min — ABNORMAL LOW (ref >60)
Glucose, Bld: 98 mg/dL (ref 70–99)
Phosphorus: 6.1 mg/dL — ABNORMAL HIGH (ref 2.5–4.6)
Potassium: 4 mmol/L (ref 3.5–5.1)
Sodium: 132 mmol/L — ABNORMAL LOW (ref 135–145)

## 2020-05-03 LAB — CBC
HCT: 28.2 % — ABNORMAL LOW (ref 39.0–52.0)
Hemoglobin: 8.8 g/dL — ABNORMAL LOW (ref 13.0–17.0)
MCH: 33.1 pg (ref 26.0–34.0)
MCHC: 31.2 g/dL (ref 30.0–36.0)
MCV: 106 fL — ABNORMAL HIGH (ref 80.0–100.0)
Platelets: 206 10*3/uL (ref 150–400)
RBC: 2.66 MIL/uL — ABNORMAL LOW (ref 4.22–5.81)
RDW: 15.5 % (ref 11.5–15.5)
WBC: 7.2 10*3/uL (ref 4.0–10.5)
nRBC: 0 % (ref 0.0–0.2)

## 2020-05-03 LAB — PROTIME-INR
INR: 2.7 — ABNORMAL HIGH (ref 0.8–1.2)
Prothrombin Time: 27.7 seconds — ABNORMAL HIGH (ref 11.4–15.2)

## 2020-05-03 MED ORDER — LAMOTRIGINE 25 MG PO TABS: 50.0000 mg | ORAL_TABLET | Freq: Two times a day (BID) | ORAL | 0 refills | Status: DC

## 2020-05-03 MED ORDER — ACETAMINOPHEN 325 MG PO TABS
ORAL_TABLET | ORAL | Status: AC
  Administered 2020-05-03: 650 mg
  Filled 2020-05-03: qty 2

## 2020-05-03 MED ORDER — ALLOPURINOL 100 MG PO TABS: 100.0000 mg | ORAL_TABLET | Freq: Every day | ORAL | 0 refills | Status: AC

## 2020-05-03 MED ORDER — WARFARIN SODIUM 1 MG PO TABS: ORAL_TABLET | ORAL | 3 refills | Status: DC

## 2020-05-03 MED ORDER — OXYCODONE HCL 5 MG PO TABS
5.0000 mg | ORAL_TABLET | Freq: Four times a day (QID) | ORAL | 0 refills | Status: DC | PRN
Start: 2020-05-03 — End: 2020-05-22

## 2020-05-03 MED ORDER — POLYETHYLENE GLYCOL 3350 17 G PO PACK: 17.0000 g | PACK | Freq: Every day | ORAL | 0 refills | Status: AC | PRN

## 2020-05-03 MED ORDER — ACETAMINOPHEN 500 MG PO TABS: 500.0000 mg | ORAL_TABLET | ORAL | 0 refills | Status: DC | PRN

## 2020-05-03 NOTE — Progress Notes (Signed)
Nephrology Follow-Up Consult Note   Assessment/Recommendations: Jeremy [name] is a/an 65 y.o. male with a past medical history significant for ESRD on HD admitted for RLE amputation.   # ESRD: MWF schedule, outpatient unit: [facility]. 3.75hrs, 2K, 2.25Ca, Na 137, Bicarbonate 35, Dialyzer: F180.  - HD today off schedule due to high pt volume, discussed with him he should still to go outpt Friday tx tomorrow.  -Will need new EDW at discharge.   # Volume/ hypertension: trend post weights with HD. BP ok.   # Anemia of Chronic Kidney Disease: was on Epo 8000 units 3x weekly as an outpatient. Increased Aranesp to 100 mcg. Hb in 8s.  # Secondary Hyperparathyroidism/Hyperphosphatemia: DCd calcitriol  because of elevated caclium level. monitor phos level, on sevelamer.   # Vascular access: L FA AVF with no issues  # RLE ischemia. 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  Pain control and dressing changes per primary. In CIR until 1/27  # Mechanical aortic valve: on warfarin outpt, heparin bridge here, mgmt per primary   # Fevers, resolved: mgmt per primary service. note bcx drawn given h/o mechanical aortic valve-neg. Possible cavitary appearing structure on cxr, ct chest does not reveal a cavitary lesion.  Dispo: plan d/c today then next HD at home unit.  _____________________________________________________  CC: ESRD  Interval History/Subjective:  No new event. Denies nausea vomiting chest pain shortness of breath.   Medications:  Current Facility-Administered Medications  Medication Dose Route Frequency Provider Last Rate Last Admin  . (feeding supplement) PROSource Plus liquid 30 mL  30 mL Oral BID BM Love, Pamela S, PA-C   30 mL at 05/02/20 1334  . acetaminophen (TYLENOL) tablet 500 mg  500 mg Oral Q4H PRN Bary Leriche, PA-C   500 mg at 04/28/20 1920  . allopurinol (ZYLOPRIM) tablet 100 mg  100 mg Oral Daily Bary Leriche, PA-C   100 mg at 05/02/20 0757  . aluminum hydroxide  (AMPHOJEL/ALTERNAGEL) suspension 10 mL  10 mL Oral Q6H PRN Love, Pamela S, PA-C      . bisacodyl (DULCOLAX) suppository 10 mg  10 mg Rectal Daily PRN Love, Pamela S, PA-C      . chlorhexidine (PERIDEX) 0.12 % solution 15 mL  15 mL Mouth Rinse BID Jamse Arn, MD   15 mL at 05/02/20 0802  . Chlorhexidine Gluconate Cloth 2 % PADS 6 each  6 each Topical Q0600 Rosita Fire, MD      . clopidogrel (PLAVIX) tablet 75 mg  75 mg Oral Daily Bary Leriche, PA-C   75 mg at 05/02/20 0759  . Darbepoetin Alfa (ARANESP) injection 100 mcg  100 mcg Intravenous Q Wed-HD Rosita Fire, MD      . diphenhydrAMINE (BENADRYL) 12.5 MG/5ML elixir 12.5-25 mg  12.5-25 mg Oral Q6H PRN Love, Pamela S, PA-C      . ezetimibe (ZETIA) tablet 10 mg  10 mg Oral Daily Bary Leriche, PA-C   10 mg at 05/02/20 0802  . guaiFENesin-dextromethorphan (ROBITUSSIN DM) 100-10 MG/5ML syrup 5-10 mL  5-10 mL Oral Q6H PRN Love, Pamela S, PA-C      . hydroxychloroquine (PLAQUENIL) tablet 400 mg  400 mg Oral QHS Love, Pamela S, PA-C   400 mg at 05/02/20 2118  . lamoTRIgine (LAMICTAL) tablet 50 mg  50 mg Oral BID Bary Leriche, PA-C   50 mg at 05/02/20 2118  . MEDLINE mouth rinse  15 mL Mouth Rinse q12n4p Patel, Ankit Le Roy,  MD   15 mL at 05/02/20 1334  . methocarbamol (ROBAXIN) tablet 500 mg  500 mg Oral TID Bary Leriche, PA-C   500 mg at 05/02/20 0758  . metoprolol tartrate (LOPRESSOR) tablet 75 mg  75 mg Oral BID Bary Leriche, PA-C   75 mg at 05/02/20 2117  . milk and molasses enema  1 enema Rectal Daily PRN Love, Pamela S, PA-C      . oxyCODONE-acetaminophen (PERCOCET/ROXICET) 5-325 MG per tablet 1-2 tablet  1-2 tablet Oral Q4H PRN Bary Leriche, PA-C   2 tablet at 05/02/20 1631  . pantoprazole (PROTONIX) EC tablet 40 mg  40 mg Oral Daily Bary Leriche, PA-C   40 mg at 05/02/20 0758  . polyethylene glycol (MIRALAX / GLYCOLAX) packet 17 g  17 g Oral Daily Bary Leriche, PA-C   17 g at 05/02/20 2130  . polyethylene  glycol (MIRALAX / GLYCOLAX) packet 17 g  17 g Oral Daily PRN Love, Pamela S, PA-C      . predniSONE (DELTASONE) tablet 5 mg  5 mg Oral Daily Love, Pamela S, PA-C   5 mg at 05/02/20 0800  . prochlorperazine (COMPAZINE) tablet 5-10 mg  5-10 mg Oral Q6H PRN Bary Leriche, PA-C   10 mg at 05/01/20 1209   Or  . prochlorperazine (COMPAZINE) injection 5-10 mg  5-10 mg Intramuscular Q6H PRN Love, Pamela S, PA-C       Or  . prochlorperazine (COMPAZINE) suppository 12.5 mg  12.5 mg Rectal Q6H PRN Love, Pamela S, PA-C      . rosuvastatin (CRESTOR) tablet 40 mg  40 mg Oral q1800 Bary Leriche, PA-C   40 mg at 05/01/20 1746  . sevelamer carbonate (RENVELA) tablet 3,200 mg  3,200 mg Oral TID WC LoveIvan Anchors, PA-C   3,200 mg at 05/02/20 1334  . simethicone (MYLICON) chewable tablet 80 mg  80 mg Oral QID PRN Love, Pamela S, PA-C      . traZODone (DESYREL) tablet 25 mg  25 mg Oral QHS Bary Leriche, PA-C   25 mg at 05/02/20 2118  . Warfarin - Pharmacist Dosing Inpatient   Does not apply [id number] Bary Leriche, PA-C   Given at 04/30/20 1809     Physical Exam: Vitals:   05/03/20 0721 05/03/20 0730  BP: 139/83 140/78  Pulse: (!) 106 97  Resp:    Temp:    SpO2:     No intake/output data recorded.  Intake/Output Summary (Last 24 hours) at 05/03/2020 0743 Last data filed at 05/02/2020 1615 Gross per 24 hour  Intake 30 ml  Output --  Net 30 ml   Constitutional: NAD, lying on bed CV: S1S2 no rub Respiratory: clear and unlabored Gastrointestinal: soft, non-tender, nondistended Ext: RLE absent below the knee, lue avf +b/t neuro: alert and oriented x3 conversant and follows commands Psych normal mood and affect   Test Results I personally reviewed new and old clinical labs and radiology tests Lab Results  Component Value Date   NA 131 (L) 04/30/2020   K 4.6 04/30/2020   CL 93 (L) 04/30/2020   CO2 24 04/30/2020   BUN 78 (H) 04/30/2020   CREATININE 11.43 (H) 04/30/2020   GLU 98 04/14/2019    CALCIUM 10.1 04/30/2020   ALBUMIN 2.7 (L) 04/30/2020   PHOS 5.4 (H) 04/30/2020    Justin Mend, MD 05/03/2020  7:43 AM

## 2020-05-03 NOTE — Progress Notes (Signed)
Report received from [facility] on Hemodialysis unit. Pt arrived to unit, alert and oriented x4, no s/sx distress, breathing even and unlabored. No issues noted at this time.

## 2020-05-03 NOTE — Plan of Care (Signed)
Pt goals of care and education met. PA reviewed DC orders and medications w/ patient who verbalized understanding, pt denied any questions/concerns at this time. Pt DC to home w/ girlfriend. Assisted to car w/ all belongings by staff.

## 2020-05-03 NOTE — Discharge Instructions (Signed)
Inpatient Rehab Discharge Instructions  Jeremy [name] Discharge date and time:  05/03/20  Activities/Precautions/ Functional Status: Activity: no lifting, driving, or strenuous exercise till cleared by MD Diet: renal diet Limit fluids to 5 cups/day Wound Care: Wash with soap and water, pat dry. Keep wound clean and dry.  Contact MD if you develop any problems with your incision/wound--redness, swelling, increase in pain, drainage or if you develop fever or chills.   Functional status:  ___ No restrictions     ___ Walk up steps independently _X__ 24/7 supervision/assistance   ___ Walk up steps with assistance ___ Intermittent supervision/assistance  ___ Bathe/dress independently ___ Walk with walker     ___ Bathe/dress with assistance ___ Walk Independently    ___ Shower independently ___ Walk with assistance    _X__ Shower with assistance _X__ No alcohol     ___ Return to work/school ________  Special Instructions:    COMMUNITY REFERRALS UPON DISCHARGE:    Home Health:   PT, OT, RN                  [place]  Phone: [phone]  Medical Equipment/Items Ordered:DROP-ARM [place]  [phone]                                                  [facility] MEDICAL  [phone]    My questions have been answered and I understand these instructions. I will adhere to these goals and the provided educational materials after my discharge from the hospital.  Patient/Caregiver Signature _______________________________ Date __________  Clinician Signature _______________________________________ Date __________  Please bring this form and your medication list with you to all your follow-up doctor's appointments.

## 2020-05-03 NOTE — Progress Notes (Signed)
PHYSICAL MEDICINE & REHABILITATION PROGRESS NOTE  Subjective/Complaints: Patient seen laying in bed this morning in HD.  He states he slept well overnight.  He is eager for discharge.  He states he was seen by vascular yesterday with plans for close follow-up incisional edges.  He was seen by nephrology yesterday, notes reviewed-no changes.  Vascular notes reviewed-follow-up as outpatient for staple removal.  ROS: Denies CP, SOB, N/V/D  Objective: Vital Signs: Blood pressure 119/64, pulse 69, temperature 98.2 F (36.8 C), temperature source Oral, resp. rate 18, height 5\' 10"  (1.778 m), weight 72.1 kg, SpO2 99 %. No results found. Recent Labs    04/30/20 1400 05/03/20 0615  WBC 7.8 7.2  HGB 8.1* 8.8*  HCT 25.4* 28.2*  PLT 199 206   Recent Labs    04/30/20 1400 05/03/20 0615  NA 131* 132*  K 4.6 4.0  CL 93* 93*  CO2 24 22  GLUCOSE 109* 98  BUN 78* 66*  CREATININE 11.43* 10.35*  CALCIUM 10.1 10.3    Intake/Output Summary (Last 24 hours) at 05/03/2020 1249 Last data filed at 05/03/2020 8527 Gross per 24 hour  Intake 30 ml  Output 1000 ml  Net -970 ml        Physical Exam: BP 119/64 (BP Location: Right Arm)   Pulse 69   Temp 98.2 F (36.8 C) (Oral)   Resp 18   Ht 5\' 10"  (1.778 m)   Wt 72.1 kg   SpO2 99%   BMI 22.81 kg/m  Constitutional: No distress . Vital signs reviewed. HENT: Normocephalic.  Atraumatic. Eyes: EOMI. No discharge. Cardiovascular: No JVD.  Irregular. + Murmur. Respiratory: Normal effort.  No stridor.  Bilateral clear to auscultation. GI: Non-distended.  BS +. Skin: Warm and dry.   Right BKA with dressing CDI Psych: Normal mood.  Normal behavior. Musc: No edema in extremities.  No tenderness in extremities.  Right BKA with staples, CDI-erythema along posterior flap darkening along staple lines. Psych: Normal mood.  Normal behavior. Musc: Right BKA with edema and tenderness, improving Neuro: Alert Motor: Bilateral upper  extremities: 5/5 proximal distal Left lower extremity: 5/5 proximal distal, stable Right lower extremity: Hip flexion 4/5, knee extension 4/5, stable  Assessment/Plan: 1. Functional deficits which require 3+ hours per day of interdisciplinary therapy in a comprehensive inpatient rehab setting.  Physiatrist is providing close team supervision and 24 hour management of active medical problems listed below.  Physiatrist and rehab team continue to assess barriers to discharge/monitor patient progress toward functional and medical goals   Care Tool:  Bathing    Body parts bathed by patient: Right arm,Left arm,Left upper leg,Chest,Abdomen,Front perineal area,Buttocks,Right upper leg,Face,Left lower leg     Body parts n/a: Right lower leg (BKA)   Bathing assist Assist Level: Independent with assistive device     Upper Body Dressing/Undressing Upper body dressing   What is the patient wearing?: Pull over shirt    Upper body assist Assist Level: Independent with assistive device    Lower Body Dressing/Undressing Lower body dressing      What is the patient wearing?: Underwear/pull up,Pants,Orthosis     Lower body assist Assist for lower body dressing: Independent with assitive device     Toileting Toileting    Toileting assist Assist for toileting: Independent with assistive device     Transfers Chair/bed transfer  Transfers assist     Chair/bed transfer assist level: Supervision/Verbal cueing Chair/bed transfer assistive device: Armrests   Locomotion Ambulation   Ambulation  assist   Ambulation activity did not occur: Safety/medical concerns  Assist level: Contact Guard/Touching assist Assistive device: Walker-rolling Max distance: 20   Walk 10 feet activity   Assist  Walk 10 feet activity did not occur: Safety/medical concerns  Assist level: Contact Guard/Touching assist Assistive device: Walker-rolling   Walk 50 feet activity   Assist Walk 50  feet with 2 turns activity did not occur: Safety/medical concerns         Walk 150 feet activity   Assist Walk 150 feet activity did not occur: Safety/medical concerns         Walk 10 feet on uneven surface  activity   Assist Walk 10 feet on uneven surfaces activity did not occur: Safety/medical concerns         Wheelchair     Assist Will patient use wheelchair at discharge?: Yes Type of Wheelchair: Manual    Wheelchair assist level: Supervision/Verbal cueing Max wheelchair distance: 200    Wheelchair 50 feet with 2 turns activity    Assist        Assist Level: Supervision/Verbal cueing   Wheelchair 150 feet activity     Assist      Assist Level: Supervision/Verbal cueing    Medical Problem List and Plan: 1.Impaired function and gait and 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 Pain Management:Continue robaxin 500 mg tid and oxycodone prnis effective.  Controlled on 1/27  Monitor with increased exertion 4. Mood:LCSW to follow for evaluation and support. -antipsychotic agents: N/A 5. Neuropsych: This patientiscapable of making decisions on hisown behalf. 6. Skin/Wound Care:Monitor wound for healing. Prosource to help promote wound healing.  Continue Xeroform with Kerlix and stump shrinker 7. Fluids/Electrolytes/Nutrition:Strict I/Os. Renal diet with 1200 cc /FR. 8. Renal cancer s/p B-nephrectomy/ESRD:  Schedule HD- usually M/W/F-at the end of the day to help with tolerance of therapy   Renevala with meals to manage metabolic bone disease.  9. A fib: Monitor HR tid- on coumadin.   Continue Metoprolol bid.   Rate controlled on 1/27  Monitor with increased  activity 10. Anemia of chronic disease/macrocytosis: On aranesp every Monday for supplementation. Monitor for signs of bleeding.   Hemoglobin 8.8 on 1/27, labs with HD 11. GERD: continue Protonix with simethicone prn.  12. CAD s/p CABG: On Crestor and metoprolol. Monitor for symptoms with activity.  Plavix resumed after discussion with vascular 13. OSA: Continue CPAP 14.  RA  Continue Plaquenil and prednisone  15.  Fever: Resolved  COVID-negative  > 30 minutes spent in total in discharge planning between myself and PA regarding aforementioned, as well discussion regarding DME equipment, follow-up appointments, follow-up therapies, discharge medications, discharge recommendations, answering questions  LOS: 10 days A FACE TO FACE EVALUATION WAS PERFORMED  Deondrea Aguado Lorie Phenix 05/03/2020, 12:49 PM

## 2020-05-04 ENCOUNTER — Telehealth: Payer: Self-pay

## 2020-05-04 ENCOUNTER — Encounter: Payer: Self-pay | Admitting: Cardiovascular Disease

## 2020-05-04 DIAGNOSIS — N2581 Secondary hyperparathyroidism of renal origin: Secondary | ICD-10-CM | POA: Diagnosis not present

## 2020-05-04 DIAGNOSIS — N186 End stage renal disease: Secondary | ICD-10-CM | POA: Diagnosis not present

## 2020-05-04 DIAGNOSIS — Z992 Dependence on renal dialysis: Secondary | ICD-10-CM | POA: Diagnosis not present

## 2020-05-04 DIAGNOSIS — D631 Anemia in chronic kidney disease: Secondary | ICD-10-CM | POA: Diagnosis not present

## 2020-05-04 LAB — LAB REPORT - SCANNED: INR: 2

## 2020-05-04 NOTE — Discharge Summary (Addendum)
Physician Discharge Summary  Patient ID: Jeremy [name] MRN: [id number] DOB/AGE: 08-02-1955 65 y.o.  Admit date: 04/23/2020 Discharge date: 05/04/2020  Discharge Diagnoses:  Principal Problem:   Below-knee amputation of left lower extremity ([place]) Active Problems:   Anemia of chronic disease   Macrocytosis   ESRD on dialysis ([place])   Rheumatoid arthritis (HCC)   Postoperative pain   Discharged Condition: Stable  Significant Diagnostic Studies: N/A   Labs:  Basic Metabolic Panel: Recent Labs  Lab 04/30/20 1400 05/03/20 0615  NA 131* 132*  K 4.6 4.0  CL 93* 93*  CO2 24 22  GLUCOSE 109* 98  BUN 78* 66*  CREATININE 11.43* 10.35*  CALCIUM 10.1 10.3  PHOS 5.4* 6.1*    CBC: Recent Labs  Lab 04/30/20 1400 05/03/20 0615  WBC 7.8 7.2  HGB 8.1* 8.8*  HCT 25.4* 28.2*  MCV 107.2* 106.0*  PLT 199 206    CBG: Recent Labs  Lab 05/01/20 0613  GLUCAP 88    Brief HPI:   Jeremy [name] is a 65 y.o. 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  He was admitted 01/06 822 with critical limb ischemia and underwent right BKA on 01/08 by Dr. Carlis Abbott.  Postop course significant for issues with A. fib with RVR as well as fevers and supratherapeutic INR.  He also had duskiness of BKA stump with concerns about need for revision.  Pain control was improving however patient was limited by right BKA, BUE weakness as well as decrease in activity tolerance.  CIR was recommended due to functional decline.   Hospital Course: Jeremy [name] was admitted to rehab 04/23/2020 for inpatient therapies to consist of PT and OT at least three hours five days a week. Past admission physiatrist, therapy team and rehab RN have worked together to provide customized collaborative inpatient rehab.  Plavix was resumed after admission. Hemodialysis has  been ongoing on MWF at end of day to help with tolerance of therapy.  His schedule has been affected by high patient volume during his stay.  Pharmacy has been assisting with management of Coumadin and dosing as appropriate.  Serial CBC showed anemia of chronic disease is stable.   He has had issues with supratherapeutic INR requiring vitamin K for reversal.  Currently PT/INR is stable at 2.9 and patient was discharged on 0.5 mg Coumadin on Mondays and Thursdays.  He is to recheck his protime on home machine in a.m. and call Coumadin clinic with results for input on dosing. Edema and tenderness right BKA is improving.  Erythema around posterior flap with skin changes are stable and he is to follow-up with Dr. Carlis Abbott on 2/15 for staple removal.  His p.o. intake is good and pain is relatively controlled on as needed use of oxycodone.  He has made good gains during his stay and currently requires supervision at wheelchair level.  He will continue to receive follow-up home health PT, OT and RN by [facility] after discharge.    Rehab course: During patient's stay in rehab weekly team conferences were held to monitor patient's progress, set goals and discuss barriers to discharge. At admission, patient required min to mod assist with mobility and supervision with basic ADL task at bed level. He has had improvement in activity tolerance, balance, postural control as well as ability to  compensate for deficits. He is able to complete ADL tasks with supervision. He is able to complete ADL tasks with supervision.  He requires contact-guard assist to ambulate 20 feet with rolling walker and for car transfers.  Family education was completed with significant other.   Discharge disposition: 01-Home or Self Care  Diet: Renal diet/1200 cc FR per day.  Special Instructions: 1. Keep incision clean and dry.     Discharge Instructions     Ambulatory referral to Physical Medicine Rehab   Complete by: As  directed    1-2 weeks follow up appt      Allergies as of 05/03/2020       Reactions   Statins Other (See Comments)   Arthralgias (intolerance), Myalgias (intolerance) Pt taking pravastatin    Gabapentin    Drowsy    Sertraline Rash        Medication List     STOP taking these medications    cinacalcet 30 MG tablet Commonly known as: SENSIPAR   iron sucrose in sodium chloride 0.9 % 100 mL   midodrine 10 MG tablet Commonly known as: PROAMATINE   RETACRIT IJ       TAKE these medications    acetaminophen 500 MG tablet Commonly known as: TYLENOL Take 1 tablet (500 mg total) by mouth every 4 (four) hours as needed. What changed:  how much to take when to take this reasons to take this   allopurinol 100 MG tablet Commonly known as: ZYLOPRIM Take 1 tablet (100 mg total) by mouth daily. What changed:  medication strength how much to take   clopidogrel 75 MG tablet Commonly known as: PLAVIX Take 1 tablet (75 mg total) by mouth daily with breakfast.   ezetimibe 10 MG tablet Commonly known as: ZETIA TAKE 1 TABLET BY MOUTH EVERY DAY   hydroxychloroquine 200 MG tablet Commonly known as: PLAQUENIL Take 400 mg by mouth at bedtime.   lamoTRIgine 25 MG tablet Commonly known as: LAMICTAL Take 2 tablets (50 mg total) by mouth 2 (two) times daily.   lidocaine-prilocaine cream Commonly known as: EMLA Apply 1 application topically daily as needed (for fistula).   loperamide 2 MG tablet Commonly known as: IMODIUM A-D Take 2 mg by mouth 4 (four) times daily as needed for diarrhea or loose stools.   Metoprolol Tartrate 75 MG Tabs Take 75 mg by mouth 2 (two) times daily.   multivitamin Tabs tablet Take 1 tablet by mouth daily.   nitroGLYCERIN 0.4 MG SL tablet Commonly known as: NITROSTAT Place 1 tablet (0.4 mg total) under the tongue every 5 (five) minutes as needed for chest pain.   oxyCODONE 5 MG immediate release tablet Commonly known as: Oxy  IR/ROXICODONE Take 1-2 tablets (5-10 mg total) by mouth every 6 (six) hours as needed for severe pain. What changed: when to take this Notes to patient: Limit to 4 pills per day.    pantoprazole 40 MG tablet Commonly known as: PROTONIX Take 1 tablet (40 mg total) by mouth daily.   polyethylene glycol 17 g packet Commonly known as: MIRALAX / GLYCOLAX Take 17 g by mouth daily as needed for mild constipation.   predniSONE 5 MG tablet Commonly known as: DELTASONE Take 5 mg by mouth daily.   rosuvastatin 40 MG tablet Commonly known as: CRESTOR Take 1 tablet (40 mg total) by mouth daily at 6 PM.   sevelamer carbonate 800 MG tablet Commonly known as: RENVELA Take 3,200 mg by mouth 3 (three) times daily  with meals.   traZODone 50 MG tablet Commonly known as: DESYREL TAKE 1/2 -1 TABLET (25-50 MG TOTAL) BY MOUTH AT BEDTIME AS NEEDED FOR SLEEP. What changed: See the new instructions.   warfarin 1 MG tablet Commonly known as: COUMADIN Take as directed. If you are unsure how to take this medication, talk to your nurse or doctor. Original instructions: Take 1/2 tablet twice a week on Mondays and Thursdays--take with supper What changed: See the new instructions.        Follow-up Information     Jamse Arn, MD Follow up.   Specialty: Physical Medicine and Rehabilitation Why: Office will call you with follow up appointment Contact information: [address] [name] [address] [phone]         Leone Haven, MD. Call.   Specialty: Family Medicine Contact information: [address] [name] [address] [phone]         Marty Heck, MD Follow up.   Specialty: Vascular Surgery Contact information: [place] [address] [phone]         Wellington Hampshire, MD. Call.   Specialty: Cardiology Why: for post hospital follow up Contact information: [name]  [address] [phone]                 Signed: Bary Leriche 05/04/2020, 6:03 PM > 30 minutes spent in total in discharge planning between myself and PA regarding aforementioned, as well discussion regarding DME equipment, follow-up appointments, follow-up therapies, discharge medications, discharge recommendations, answering questions

## 2020-05-04 NOTE — Telephone Encounter (Signed)
Transition Care Management Unsuccessful Follow-up Telephone Call  Date of discharge and from where:  05/03/20 from [facility]  Attempts:  1st Attempt  Reason for unsuccessful TCM follow-up call:  Unable to reach patientWill follow as appropriate.

## 2020-05-07 DIAGNOSIS — T8610 Unspecified complication of kidney transplant: Secondary | ICD-10-CM | POA: Diagnosis not present

## 2020-05-07 DIAGNOSIS — N2581 Secondary hyperparathyroidism of renal origin: Secondary | ICD-10-CM | POA: Diagnosis not present

## 2020-05-07 DIAGNOSIS — N186 End stage renal disease: Secondary | ICD-10-CM | POA: Diagnosis not present

## 2020-05-07 DIAGNOSIS — D631 Anemia in chronic kidney disease: Secondary | ICD-10-CM | POA: Diagnosis not present

## 2020-05-07 DIAGNOSIS — Z992 Dependence on renal dialysis: Secondary | ICD-10-CM | POA: Diagnosis not present

## 2020-05-07 NOTE — Telephone Encounter (Signed)
Transition Care Management Follow-up Telephone Call  Date of discharge and from where: 05/03/20 from [facility].  How have you been since you were released from the hospital?   Bandages changed daily. Incision clean and dry. Pain is controlled. Denies n/v/d, fever.  Hemodialysis has been ongoing on M/W/F. PO intake is good. Improvement in activity tolerance.  Any questions or concerns? No  Items Reviewed:  Did the pt receive and understand the discharge instructions provided? Yes  Medications obtained and verified? Yes   Any new allergies since your discharge? No   Dietary orders reviewed? Renal diet/1200 cc FR per day  Do you have support at home? Yes   Home Care and Equipment/Supplies: Were home health services ordered? Yes, PT, OT, RN If so, what is the name of the agency? [facility]  Has the agency set up a time to come to the patient's home? First assessment tomorrow.     Functional Questionnaire: (I = Independent and D = Dependent) ADLs: Girlfriend assist as needed  Eating- I  Maintaining continence- I  Transferring/Ambulation- walker, wheelchair, bath bench, BSC  Managing Meds- I   Follow up appointments reviewed:   PCP Hospital f/u appt confirmed? Yes  Scheduled to see Dr. Caryl Bis on 05/29/20 @ 11:30.  [facility] f/u appt confirmed? Yes  Scheduled to see [facility] for staple removal 2/15, Cardiology and Physical Medicine and Rehabilitation.  Are transportation arrangements needed? No   If their condition worsens, is the pt aware to call PCP or go to the Emergency Dept.? Yes  Was the patient provided with contact information for the PCP's office or ED? Yes  Was to pt encouraged to call back with questions or concerns? Yes

## 2020-05-08 DIAGNOSIS — Z4781 Encounter for orthopedic aftercare following surgical amputation: Secondary | ICD-10-CM | POA: Diagnosis not present

## 2020-05-08 DIAGNOSIS — R000 Tachycardia, unspecified: Secondary | ICD-10-CM | POA: Diagnosis not present

## 2020-05-08 DIAGNOSIS — N2581 Secondary hyperparathyroidism of renal origin: Secondary | ICD-10-CM | POA: Diagnosis not present

## 2020-05-08 DIAGNOSIS — M1990 Unspecified osteoarthritis, unspecified site: Secondary | ICD-10-CM | POA: Diagnosis not present

## 2020-05-08 DIAGNOSIS — I5020 Unspecified systolic (congestive) heart failure: Secondary | ICD-10-CM | POA: Diagnosis not present

## 2020-05-08 DIAGNOSIS — I350 Nonrheumatic aortic (valve) stenosis: Secondary | ICD-10-CM | POA: Diagnosis not present

## 2020-05-08 DIAGNOSIS — N186 End stage renal disease: Secondary | ICD-10-CM | POA: Diagnosis not present

## 2020-05-08 DIAGNOSIS — D631 Anemia in chronic kidney disease: Secondary | ICD-10-CM | POA: Diagnosis not present

## 2020-05-08 DIAGNOSIS — Z905 Acquired absence of kidney: Secondary | ICD-10-CM | POA: Diagnosis not present

## 2020-05-08 DIAGNOSIS — I4821 Permanent atrial fibrillation: Secondary | ICD-10-CM | POA: Diagnosis not present

## 2020-05-08 DIAGNOSIS — R011 Cardiac murmur, unspecified: Secondary | ICD-10-CM | POA: Diagnosis not present

## 2020-05-08 DIAGNOSIS — I132 Hypertensive heart and chronic kidney disease with heart failure and with stage 5 chronic kidney disease, or end stage renal disease: Secondary | ICD-10-CM | POA: Diagnosis not present

## 2020-05-08 DIAGNOSIS — I255 Ischemic cardiomyopathy: Secondary | ICD-10-CM | POA: Diagnosis not present

## 2020-05-08 DIAGNOSIS — I739 Peripheral vascular disease, unspecified: Secondary | ICD-10-CM | POA: Diagnosis not present

## 2020-05-08 DIAGNOSIS — E291 Testicular hypofunction: Secondary | ICD-10-CM | POA: Diagnosis not present

## 2020-05-08 DIAGNOSIS — M069 Rheumatoid arthritis, unspecified: Secondary | ICD-10-CM | POA: Diagnosis not present

## 2020-05-08 DIAGNOSIS — E785 Hyperlipidemia, unspecified: Secondary | ICD-10-CM | POA: Diagnosis not present

## 2020-05-08 DIAGNOSIS — K219 Gastro-esophageal reflux disease without esophagitis: Secondary | ICD-10-CM | POA: Diagnosis not present

## 2020-05-08 DIAGNOSIS — J449 Chronic obstructive pulmonary disease, unspecified: Secondary | ICD-10-CM | POA: Diagnosis not present

## 2020-05-08 DIAGNOSIS — I2510 Atherosclerotic heart disease of native coronary artery without angina pectoris: Secondary | ICD-10-CM | POA: Diagnosis not present

## 2020-05-08 DIAGNOSIS — Z89511 Acquired absence of right leg below knee: Secondary | ICD-10-CM | POA: Diagnosis not present

## 2020-05-08 DIAGNOSIS — Z85528 Personal history of other malignant neoplasm of kidney: Secondary | ICD-10-CM | POA: Diagnosis not present

## 2020-05-08 DIAGNOSIS — G8918 Other acute postprocedural pain: Secondary | ICD-10-CM | POA: Diagnosis not present

## 2020-05-08 DIAGNOSIS — G4733 Obstructive sleep apnea (adult) (pediatric): Secondary | ICD-10-CM | POA: Diagnosis not present

## 2020-05-08 DIAGNOSIS — Z992 Dependence on renal dialysis: Secondary | ICD-10-CM | POA: Diagnosis not present

## 2020-05-08 LAB — POCT INR: INR: 2.7 (ref 2.0–3.0)

## 2020-05-08 NOTE — Telephone Encounter (Signed)
Reviewed

## 2020-05-09 ENCOUNTER — Ambulatory Visit (INDEPENDENT_AMBULATORY_CARE_PROVIDER_SITE_OTHER): Payer: Medicare Other

## 2020-05-09 ENCOUNTER — Encounter: Payer: Self-pay | Admitting: Cardiovascular Disease

## 2020-05-09 DIAGNOSIS — I4811 Longstanding persistent atrial fibrillation: Secondary | ICD-10-CM

## 2020-05-09 DIAGNOSIS — Z992 Dependence on renal dialysis: Secondary | ICD-10-CM | POA: Diagnosis not present

## 2020-05-09 DIAGNOSIS — N2581 Secondary hyperparathyroidism of renal origin: Secondary | ICD-10-CM | POA: Diagnosis not present

## 2020-05-09 DIAGNOSIS — Z5181 Encounter for therapeutic drug level monitoring: Secondary | ICD-10-CM

## 2020-05-09 DIAGNOSIS — Z952 Presence of prosthetic heart valve: Secondary | ICD-10-CM | POA: Diagnosis not present

## 2020-05-09 DIAGNOSIS — N186 End stage renal disease: Secondary | ICD-10-CM | POA: Diagnosis not present

## 2020-05-09 DIAGNOSIS — D631 Anemia in chronic kidney disease: Secondary | ICD-10-CM | POA: Diagnosis not present

## 2020-05-09 NOTE — Patient Instructions (Signed)
Sent MyChart message to pt advising him to: - continue warfarin dosage of 1 mg tablet every day. - recheck INR in 2 weeks

## 2020-05-10 DIAGNOSIS — D631 Anemia in chronic kidney disease: Secondary | ICD-10-CM | POA: Diagnosis not present

## 2020-05-10 DIAGNOSIS — I132 Hypertensive heart and chronic kidney disease with heart failure and with stage 5 chronic kidney disease, or end stage renal disease: Secondary | ICD-10-CM | POA: Diagnosis not present

## 2020-05-10 DIAGNOSIS — N186 End stage renal disease: Secondary | ICD-10-CM | POA: Diagnosis not present

## 2020-05-10 DIAGNOSIS — I5020 Unspecified systolic (congestive) heart failure: Secondary | ICD-10-CM | POA: Diagnosis not present

## 2020-05-10 DIAGNOSIS — Z4781 Encounter for orthopedic aftercare following surgical amputation: Secondary | ICD-10-CM | POA: Diagnosis not present

## 2020-05-10 DIAGNOSIS — I2510 Atherosclerotic heart disease of native coronary artery without angina pectoris: Secondary | ICD-10-CM | POA: Diagnosis not present

## 2020-05-11 DIAGNOSIS — Z992 Dependence on renal dialysis: Secondary | ICD-10-CM | POA: Diagnosis not present

## 2020-05-11 DIAGNOSIS — D631 Anemia in chronic kidney disease: Secondary | ICD-10-CM | POA: Diagnosis not present

## 2020-05-11 DIAGNOSIS — N2581 Secondary hyperparathyroidism of renal origin: Secondary | ICD-10-CM | POA: Diagnosis not present

## 2020-05-11 DIAGNOSIS — N186 End stage renal disease: Secondary | ICD-10-CM | POA: Diagnosis not present

## 2020-05-14 DIAGNOSIS — Z992 Dependence on renal dialysis: Secondary | ICD-10-CM | POA: Diagnosis not present

## 2020-05-14 DIAGNOSIS — D631 Anemia in chronic kidney disease: Secondary | ICD-10-CM | POA: Diagnosis not present

## 2020-05-14 DIAGNOSIS — N186 End stage renal disease: Secondary | ICD-10-CM | POA: Diagnosis not present

## 2020-05-14 DIAGNOSIS — N2581 Secondary hyperparathyroidism of renal origin: Secondary | ICD-10-CM | POA: Diagnosis not present

## 2020-05-15 ENCOUNTER — Encounter: Payer: Medicare Other | Admitting: Vascular Surgery

## 2020-05-15 ENCOUNTER — Ambulatory Visit: Payer: Medicare Other | Admitting: Neurology

## 2020-05-15 DIAGNOSIS — D631 Anemia in chronic kidney disease: Secondary | ICD-10-CM | POA: Diagnosis not present

## 2020-05-15 DIAGNOSIS — I5020 Unspecified systolic (congestive) heart failure: Secondary | ICD-10-CM | POA: Diagnosis not present

## 2020-05-15 DIAGNOSIS — I132 Hypertensive heart and chronic kidney disease with heart failure and with stage 5 chronic kidney disease, or end stage renal disease: Secondary | ICD-10-CM | POA: Diagnosis not present

## 2020-05-15 DIAGNOSIS — N186 End stage renal disease: Secondary | ICD-10-CM | POA: Diagnosis not present

## 2020-05-15 DIAGNOSIS — Z4781 Encounter for orthopedic aftercare following surgical amputation: Secondary | ICD-10-CM | POA: Diagnosis not present

## 2020-05-15 DIAGNOSIS — I2510 Atherosclerotic heart disease of native coronary artery without angina pectoris: Secondary | ICD-10-CM | POA: Diagnosis not present

## 2020-05-15 LAB — POCT INR: INR: 3.9 — AB (ref 2.0–3.0)

## 2020-05-16 ENCOUNTER — Encounter: Payer: Self-pay | Admitting: Cardiovascular Disease

## 2020-05-16 ENCOUNTER — Ambulatory Visit (INDEPENDENT_AMBULATORY_CARE_PROVIDER_SITE_OTHER): Payer: Medicare Other

## 2020-05-16 DIAGNOSIS — D631 Anemia in chronic kidney disease: Secondary | ICD-10-CM | POA: Diagnosis not present

## 2020-05-16 DIAGNOSIS — Z992 Dependence on renal dialysis: Secondary | ICD-10-CM | POA: Diagnosis not present

## 2020-05-16 DIAGNOSIS — I4891 Unspecified atrial fibrillation: Secondary | ICD-10-CM | POA: Diagnosis not present

## 2020-05-16 DIAGNOSIS — N2581 Secondary hyperparathyroidism of renal origin: Secondary | ICD-10-CM | POA: Diagnosis not present

## 2020-05-16 DIAGNOSIS — Z952 Presence of prosthetic heart valve: Secondary | ICD-10-CM | POA: Diagnosis not present

## 2020-05-16 DIAGNOSIS — Z5181 Encounter for therapeutic drug level monitoring: Secondary | ICD-10-CM | POA: Diagnosis not present

## 2020-05-16 DIAGNOSIS — N186 End stage renal disease: Secondary | ICD-10-CM | POA: Diagnosis not present

## 2020-05-16 LAB — LAB REPORT - SCANNED: INR: 2.7

## 2020-05-16 NOTE — Patient Instructions (Signed)
Pt sent MyChart message that he saw his reading and did not take his warfarin last night.   Advised him since he held that dose, to - resume warfarin dosage of 1 mg tablet every day. - recheck INR in 1 week

## 2020-05-17 DIAGNOSIS — I2510 Atherosclerotic heart disease of native coronary artery without angina pectoris: Secondary | ICD-10-CM | POA: Diagnosis not present

## 2020-05-17 DIAGNOSIS — D631 Anemia in chronic kidney disease: Secondary | ICD-10-CM | POA: Diagnosis not present

## 2020-05-17 DIAGNOSIS — N186 End stage renal disease: Secondary | ICD-10-CM | POA: Diagnosis not present

## 2020-05-17 DIAGNOSIS — I132 Hypertensive heart and chronic kidney disease with heart failure and with stage 5 chronic kidney disease, or end stage renal disease: Secondary | ICD-10-CM | POA: Diagnosis not present

## 2020-05-17 DIAGNOSIS — Z4781 Encounter for orthopedic aftercare following surgical amputation: Secondary | ICD-10-CM | POA: Diagnosis not present

## 2020-05-17 DIAGNOSIS — I5020 Unspecified systolic (congestive) heart failure: Secondary | ICD-10-CM | POA: Diagnosis not present

## 2020-05-18 ENCOUNTER — Other Ambulatory Visit: Payer: Self-pay | Admitting: Family

## 2020-05-18 DIAGNOSIS — N186 End stage renal disease: Secondary | ICD-10-CM | POA: Diagnosis not present

## 2020-05-18 DIAGNOSIS — Z992 Dependence on renal dialysis: Secondary | ICD-10-CM | POA: Diagnosis not present

## 2020-05-18 DIAGNOSIS — N2581 Secondary hyperparathyroidism of renal origin: Secondary | ICD-10-CM | POA: Diagnosis not present

## 2020-05-18 DIAGNOSIS — D631 Anemia in chronic kidney disease: Secondary | ICD-10-CM | POA: Diagnosis not present

## 2020-05-21 DIAGNOSIS — Z992 Dependence on renal dialysis: Secondary | ICD-10-CM | POA: Diagnosis not present

## 2020-05-21 DIAGNOSIS — N186 End stage renal disease: Secondary | ICD-10-CM | POA: Diagnosis not present

## 2020-05-21 DIAGNOSIS — N2581 Secondary hyperparathyroidism of renal origin: Secondary | ICD-10-CM | POA: Diagnosis not present

## 2020-05-21 DIAGNOSIS — D631 Anemia in chronic kidney disease: Secondary | ICD-10-CM | POA: Diagnosis not present

## 2020-05-22 ENCOUNTER — Ambulatory Visit (INDEPENDENT_AMBULATORY_CARE_PROVIDER_SITE_OTHER): Payer: Self-pay | Admitting: Physician Assistant

## 2020-05-22 ENCOUNTER — Other Ambulatory Visit: Payer: Self-pay

## 2020-05-22 ENCOUNTER — Encounter: Payer: Self-pay | Admitting: Physician Assistant

## 2020-05-22 VITALS — BP 117/72 | HR 122 | Temp 98.6°F | Resp 20 | Ht 70.0 in

## 2020-05-22 DIAGNOSIS — I132 Hypertensive heart and chronic kidney disease with heart failure and with stage 5 chronic kidney disease, or end stage renal disease: Secondary | ICD-10-CM | POA: Diagnosis not present

## 2020-05-22 DIAGNOSIS — S88111A Complete traumatic amputation at level between knee and ankle, right lower leg, initial encounter: Secondary | ICD-10-CM

## 2020-05-22 DIAGNOSIS — D631 Anemia in chronic kidney disease: Secondary | ICD-10-CM | POA: Diagnosis not present

## 2020-05-22 DIAGNOSIS — I2510 Atherosclerotic heart disease of native coronary artery without angina pectoris: Secondary | ICD-10-CM | POA: Diagnosis not present

## 2020-05-22 DIAGNOSIS — I5020 Unspecified systolic (congestive) heart failure: Secondary | ICD-10-CM | POA: Diagnosis not present

## 2020-05-22 DIAGNOSIS — I739 Peripheral vascular disease, unspecified: Secondary | ICD-10-CM

## 2020-05-22 DIAGNOSIS — N186 End stage renal disease: Secondary | ICD-10-CM | POA: Diagnosis not present

## 2020-05-22 DIAGNOSIS — Z4781 Encounter for orthopedic aftercare following surgical amputation: Secondary | ICD-10-CM | POA: Diagnosis not present

## 2020-05-22 NOTE — Progress Notes (Signed)
POST OPERATIVE OFFICE NOTE    CC:  F/u for surgery  HPI:  This is a 65 y.o. 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 He had undergone multiple right lower extremity tibial interventions at [facility] and previously at [facility] including retrograde tibial interventions.  Ultimately his wounds failed to heal and he had progressive tissue loss with pain.  The limb was felt to no longer be salvageable. In the hospital at time of discharge his right BKA had some concerning signs of adequate healing but was felt to be mostly ecchymosis. No signs of infection and wanted to allow it to demarcate to determine if he needed further revision. At the time we were hopeful to try and avoid revision or more proximal amputation.   He presents today for his post op follow up. He says he has been having some phantom pains but overall pain is manageable. His wife has been assisting with the dressing changes. She has been applying Vaseline gauze and keeping it wrapped almost 24/7. They additionally have been keeping the shrinker on almost 24/7 since discharge.   Patient reports sore area on right medial aspect of 1st toe. Wife says she felt a spot on the right toe where it has been sore yesterday and then when he got up this morning to put his socks on he noticed the small little red spot/ blister. He otherwise denies any pain in the leg. He says he has a long history of numbness throughout the leg. Has multiple stents in the LLE that were previously placed at [facility]. He does occasionally bear weight on left leg and uses it to pivot and transfer.    Allergies  Allergen Reactions  . Statins Other (See Comments)    Arthralgias (intolerance), Myalgias (intolerance)  Pt taking pravastatin   . Gabapentin     Drowsy   . Sertraline Rash    Current Outpatient Medications  Medication Sig Dispense Refill  . acetaminophen (TYLENOL) 500 MG tablet Take  1 tablet (500 mg total) by mouth every 4 (four) hours as needed. 30 tablet 0  . allopurinol (ZYLOPRIM) 100 MG tablet Take 1 tablet (100 mg total) by mouth daily. 30 tablet 0  . CHOLECALCIFEROL PO Take by mouth.    . clopidogrel (PLAVIX) 75 MG tablet Take 1 tablet (75 mg total) by mouth daily with breakfast. 90 tablet 3  . ezetimibe (ZETIA) 10 MG tablet TAKE 1 TABLET BY MOUTH EVERY DAY (Patient taking differently: Take 10 mg by mouth daily.) 90 tablet 1  . hydroxychloroquine (PLAQUENIL) 200 MG tablet Take 400 mg by mouth at bedtime.    . lamoTRIgine (LAMICTAL) 25 MG tablet Take 2 tablets (50 mg total) by mouth 2 (two) times daily. 120 tablet 0  . lidocaine-prilocaine (EMLA) cream Apply 1 application topically daily as needed (for fistula).    . loperamide (IMODIUM A-D) 2 MG tablet Take 2 mg by mouth 4 (four) times daily as needed for diarrhea or loose stools.    . megestrol (MEGACE) 40 MG/ML suspension TAKE 10 ML (400 MG TOTAL) BY MOUTH DAILY.    . metoprolol tartrate 75 MG TABS Take 75 mg by mouth 2 (two) times daily. 180 tablet 3  . multivitamin (RENA-VIT) TABS tablet Take 1 tablet by mouth daily.    Marland Kitchen oxyCODONE-acetaminophen (PERCOCET/ROXICET) 5-325 MG tablet     . pantoprazole (PROTONIX) 40 MG tablet Take 1 tablet (40 mg total)  by mouth daily. 90 tablet 3  . polyethylene glycol (MIRALAX / GLYCOLAX) 17 g packet Take 17 g by mouth daily as needed for mild constipation. 30 each 0  . predniSONE (DELTASONE) 5 MG tablet Take 5 mg by mouth daily.    . rosuvastatin (CRESTOR) 40 MG tablet Take 1 tablet (40 mg total) by mouth daily at 6 PM. 90 tablet 3  . sevelamer carbonate (RENVELA) 800 MG tablet Take 3,200 mg by mouth 3 (three) times daily with meals.    . traZODone (DESYREL) 50 MG tablet TAKE 1/2 -1 TABLET (25-50 MG TOTAL) BY MOUTH AT BEDTIME AS NEEDED FOR SLEEP. (Patient taking differently: Take 25-50 mg by mouth at bedtime.) 90 tablet 2  . warfarin (COUMADIN) 1 MG tablet Take 1/2 tablet twice a  week on Mondays and Thursdays--take with supper 90 tablet 3  . nitroGLYCERIN (NITROSTAT) 0.4 MG SL tablet Place 1 tablet (0.4 mg total) under the tongue every 5 (five) minutes as needed for chest pain. 25 tablet 3   No current facility-administered medications for this visit.     ROS:  See HPI  Physical Exam:  Vitals:   05/22/20 0914  BP: 117/72  Pulse: (!) 122  Resp: 20  Temp: 98.6 F (37 C)  TempSrc: Temporal  SpO2: 100%  Height: 5\' 10"  (1.778 m)     Extremities:  BLE well perfused and warm. Left leg with non palpable distal pulses. Motor and sensation intact. Right BKA staple line intact with large eschar.  Right BKA stump with large area of eschar and some surrounding maceration. Anterior flap looks viable. Eschar is most of staple line and posterior flap  Right 1st toe small pinpoint erythematous spot Neuro: alert and oriented  Assessment/Plan:  This is a 65 y.o. male who is s/p right BKA on 04/14/20 by Dr. Carlis Abbott for CLI and gangrene.  Overall feeling good. Some phantom pains. Wife has been dressing right BKA stump daily. I did not remove the staples at today's visit with concerns that staple line would just fall appart - Patient and his wife really would like to give the right BKA more time prior to agreeing for revision/ more proximal amputation. I instructed them on allowing stump to be open to air for 1-2 hours a day. Clean with mild soap and water. No longer need to apply Xeroform or Vaseline gauze as this is keeping wound too moist and skin edges macerated - Hold off on using shrinker until right BKA incision is healed - discussed that if the wound makes no progress at next follow up or if it shows any signs of worsening will need to really consider revision vs more proximal amputation and they understand - left leg remains stable but they are concerned with small pinpoint blister. Will obtain non invasive studies at next office visit -will have him follow up in 2 weeks  for wound check and also for LLE arterial duplex and ABIs   [name], PA-C [facility] [phone]  Clinic MD:  Dr. Carlis Abbott

## 2020-05-23 ENCOUNTER — Encounter: Payer: Self-pay | Admitting: Cardiovascular Disease

## 2020-05-23 ENCOUNTER — Other Ambulatory Visit: Payer: Self-pay

## 2020-05-23 DIAGNOSIS — N2581 Secondary hyperparathyroidism of renal origin: Secondary | ICD-10-CM | POA: Diagnosis not present

## 2020-05-23 DIAGNOSIS — Z992 Dependence on renal dialysis: Secondary | ICD-10-CM | POA: Diagnosis not present

## 2020-05-23 DIAGNOSIS — I739 Peripheral vascular disease, unspecified: Secondary | ICD-10-CM

## 2020-05-23 DIAGNOSIS — N186 End stage renal disease: Secondary | ICD-10-CM | POA: Diagnosis not present

## 2020-05-23 DIAGNOSIS — D631 Anemia in chronic kidney disease: Secondary | ICD-10-CM | POA: Diagnosis not present

## 2020-05-24 ENCOUNTER — Ambulatory Visit (INDEPENDENT_AMBULATORY_CARE_PROVIDER_SITE_OTHER): Payer: Medicare Other | Admitting: Adult Health

## 2020-05-24 ENCOUNTER — Other Ambulatory Visit: Payer: Self-pay

## 2020-05-24 ENCOUNTER — Encounter: Payer: Self-pay | Admitting: Adult Health

## 2020-05-24 DIAGNOSIS — J40 Bronchitis, not specified as acute or chronic: Secondary | ICD-10-CM | POA: Diagnosis not present

## 2020-05-24 DIAGNOSIS — N186 End stage renal disease: Secondary | ICD-10-CM | POA: Diagnosis not present

## 2020-05-24 DIAGNOSIS — D631 Anemia in chronic kidney disease: Secondary | ICD-10-CM | POA: Diagnosis not present

## 2020-05-24 DIAGNOSIS — I2510 Atherosclerotic heart disease of native coronary artery without angina pectoris: Secondary | ICD-10-CM | POA: Diagnosis not present

## 2020-05-24 DIAGNOSIS — Z4781 Encounter for orthopedic aftercare following surgical amputation: Secondary | ICD-10-CM | POA: Diagnosis not present

## 2020-05-24 DIAGNOSIS — G4733 Obstructive sleep apnea (adult) (pediatric): Secondary | ICD-10-CM | POA: Diagnosis not present

## 2020-05-24 DIAGNOSIS — I132 Hypertensive heart and chronic kidney disease with heart failure and with stage 5 chronic kidney disease, or end stage renal disease: Secondary | ICD-10-CM | POA: Diagnosis not present

## 2020-05-24 DIAGNOSIS — I5020 Unspecified systolic (congestive) heart failure: Secondary | ICD-10-CM | POA: Diagnosis not present

## 2020-05-24 NOTE — Progress Notes (Signed)
@Patient  ID: Jeremy [name], male    DOB: 24-Jul-1955, 65 y.o.   MRN: [id number]  Chief Complaint  Patient presents with  . Follow-up    Referring provider: Leone Haven, MD  HPI: 65 year old male with multiple comorbidities including A. fib on Coumadin, peripheral artery disease, coronary artery disease status post CABG, aortic stenosis with mechanical aortic valve, ESRD (IgA nephropathy diagnosed at 26) on hemodialysis, previous failed renal transplant x2, congestive heart failure, cardiomyopathy, history of renal cell carcinoma Patient is followed for obstructive sleep apnea and abnormal CT chest  TEST/EVENTS :  High-resolution CT chest January 04, 2020 mildly dependent irregular interstitial opacity and groundglass not significantly changed suggestive of minimal sequela of prior infection or inflammation, no findings suggestive of interstitial lung disease.  CT chest April 19, 2020 no evidence of cavitary mass in the left lung, mild scarring and dependent atelectasis in the lower lobes.  Mild left lower lobe bronchiectasis.  2D echo October 01, 2019 EF 45 to 50%, moderate left ventricular hypertrophy and moderate hypokinesis of the left ventricle.  Moderate dilated left atrium aortic valve replacement/Saint Jude valve.  No regurg or stenosis noted  05/24/2020 Follow up : OSA and Abnormal CT chest  Patient returns for a follow-up visit.  Patient has underlying obstructive sleep apnea.  He is recently gotten a new CPAP machine.  Patient says he has been having some difficulty with feeling like the pressures were not strong enough at times.  CPAP download shows excellent compliance with 97% usage.  Daily average usage at 5.5 hours.  Patient is on CPAP 13 cm H2O.  AHI is 17/hour.  Patient says that he has been on CPAP for many years.  He is always worn it every single night.  Patient says he has had quite a few medical problems over the last few years.  Recently had a below the knee right  amputation.  He has significant peripheral artery disease.  Known congestive heart failure and cardiomyopathy.  He is currently on some pain medications due to recent surgery.  Patient says he does feel tired and fatigued.  But is also on dialysis 3 days a week.  Patient says he did have COVID-19 infection in December but feels that he recovered for this.  He has been vaccinated for COVID-19 x2.  He has no ongoing cough or congestion. He did have a CT chest after chest x-ray showed some abnormal findings.  CT chest on April 19, 2020 showed no evidence of a cavitary mass in the left lung.  There was some mild scarring and dependent atelectasis in the left lower lobe.  And some mild left lower lobe bronchiectasis.  Patient says overall he feels his breathing is doing well.  No flare of cough or wheezing.  Allergies  Allergen Reactions  . Statins Other (See Comments)    Arthralgias (intolerance), Myalgias (intolerance)  Pt taking pravastatin   . Gabapentin     Drowsy   . Sertraline Rash    Immunization History  Administered Date(s) Administered  . Hepatitis B, adult 11/29/2015, 12/29/2015, 10/13/2016, 11/12/2016, 12/24/2016, 04/30/2017, 07/16/2018, 08/11/2018, 09/10/2018, 12/08/2018  . Influenza,inj,Quad PF,6+ Mos 01/03/2019  . Influenza,inj,quad, With Preservative 01/04/2018  . Influenza-Unspecified 04/20/2013, 01/09/2014, 01/04/2015, 12/25/2015  . PFIZER(Purple Top)SARS-COV-2 Vaccination 06/17/2019, 12/26/2019  . Pneumococcal Conjugate-13 03/25/2017  . Pneumococcal Polysaccharide-23 01/01/2016  . Td 04/20/2006    Past Medical History:  Diagnosis Date  . Abnormal stress test 03/19/2018  . 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  Aortic stenosis    a. 2014 s/p mech AVR ([facility])-->chronic coumadin; b. 08/2018 Echo: Mild AS/AI. Nl fxn/gradient.  . Arthritis   . Atrial fibrillation ([place])   . CAD (coronary artery disease)    a. 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 02/2018 MV [facility]): large inf, infsept, inflat infarct w/ min rev, EF 30%; c. 06/2018 Cath: LM min irregs, LAD 60p, 95/30/42m, LCX min irregs, RCA 40p, 38m, 90d, RPAV 90, RPDA 95ost; d. 09/2018 CABGx3 (LIMA->LAD, VG->D1, VG->PDA).  . Chronic anticoagulation 06/2012   a. Coumadin in the setting of mech AVR and afib.  Marland Kitchen COVID-19 03/15/2019  . CPAP (continuous positive airway pressure) dependence   . Dialysis patient [facility]) 04/07/2006   Patient started HD around 1996,  had 1st transplant LLQ around 1998 until 2007 when they found renal cell cancer in transplant and both native kidneys, all were removed > went back on HD until 2nd transplant RLQ in 2014 which lasted 3 yrs; it was embolized for suspected acute rejection. Is currently on HD MWF in [place] w/ [facility] nephrology.  . ESRD (end stage renal disease) ([place])    a. 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 1999 s/p Kidney transplant; c. 2014 Bilat nephrectomy (transplanted kidney resected) in the setting of renal cell carcinoma; d. PD until 11/2017-->HD.  Marland Kitchen Heart murmur   . HFrEF (heart failure with reduced ejection fraction) ([place])    a. 07/2016 Echo: EF 55-60%; b. 02/2018 MV: EF 30%; c. 04/2018 Echo: EF 35%; d. 08/2018 Echo: EF 30-35%. Glob HK w/o rwma. Mildly reduced RV fxn. Mod to sev dil LA. Nl AoV fxn.  Marland Kitchen Hx of colonoscopy    2009 by Dr. Laural Golden. Normal except for small submucosal lipoma. No evidence of polyps or colitis.  . Hyperlipidemia   . Hypertension   . Hypogonadism in male 07/25/2015  . Ischemic cardiomyopathy    a. 07/2016 Echo: EF 55-60%; b. 02/2018 MV: EF 30%; c. 04/2018 Echo: EF 35%; d. 08/2018 Echo: EF 30-35%.  . OSA (obstructive sleep apnea)    No CPAP  . Permanent atrial fibrillation ([facility])    a. CHA2DS2VASc = 3-->Coumadin (also mech AVR); b. 06/2018 Amio started 2/2 elevated rates.  . Renal cell cancer ([place])   . Renal cell carcinoma ([place]) 08/04/2016    Tobacco History: Social  History   Tobacco Use  Smoking Status Never Smoker  Smokeless Tobacco Never Used   Counseling given: Not Answered   Outpatient Medications Prior to Visit  Medication Sig Dispense Refill  . acetaminophen (TYLENOL) 500 MG tablet Take 1 tablet (500 mg total) by mouth every 4 (four) hours as needed. 30 tablet 0  . allopurinol (ZYLOPRIM) 100 MG tablet Take 1 tablet (100 mg total) by mouth daily. 30 tablet 0  . CHOLECALCIFEROL PO Take by mouth.    . clopidogrel (PLAVIX) 75 MG tablet Take 1 tablet (75 mg total) by mouth daily with breakfast. 90 tablet 3  . ezetimibe (ZETIA) 10 MG tablet TAKE 1 TABLET BY MOUTH EVERY DAY (Patient taking differently: Take 10 mg by mouth daily.) 90 tablet 1  . hydroxychloroquine (PLAQUENIL) 200 MG tablet Take 400 mg by mouth at bedtime.    . lamoTRIgine (LAMICTAL) 25 MG tablet Take 2 tablets (50 mg total) by mouth 2 (two) times daily. 120 tablet 0  . lidocaine-prilocaine (EMLA) cream Apply 1 application topically daily as needed (for fistula).    Marland Kitchen  loperamide (IMODIUM A-D) 2 MG tablet Take 2 mg by mouth 4 (four) times daily as needed for diarrhea or loose stools.    . megestrol (MEGACE) 40 MG/ML suspension TAKE 10 ML (400 MG TOTAL) BY MOUTH DAILY.    . metoprolol tartrate 75 MG TABS Take 75 mg by mouth 2 (two) times daily. 180 tablet 3  . multivitamin (RENA-VIT) TABS tablet Take 1 tablet by mouth daily.    Marland Kitchen oxyCODONE-acetaminophen (PERCOCET/ROXICET) 5-325 MG tablet     . pantoprazole (PROTONIX) 40 MG tablet Take 1 tablet (40 mg total) by mouth daily. 90 tablet 3  . polyethylene glycol (MIRALAX / GLYCOLAX) 17 g packet Take 17 g by mouth daily as needed for mild constipation. 30 each 0  . predniSONE (DELTASONE) 5 MG tablet Take 5 mg by mouth daily.    . rosuvastatin (CRESTOR) 40 MG tablet Take 1 tablet (40 mg total) by mouth daily at 6 PM. 90 tablet 3  . sevelamer carbonate (RENVELA) 800 MG tablet Take 3,200 mg by mouth 3 (three) times daily with meals.    .  traZODone (DESYREL) 50 MG tablet TAKE 1/2 -1 TABLET (25-50 MG TOTAL) BY MOUTH AT BEDTIME AS NEEDED FOR SLEEP. (Patient taking differently: Take 25-50 mg by mouth at bedtime.) 90 tablet 2  . warfarin (COUMADIN) 1 MG tablet Take 1/2 tablet twice a week on Mondays and Thursdays--take with supper 90 tablet 3  . nitroGLYCERIN (NITROSTAT) 0.4 MG SL tablet Place 1 tablet (0.4 mg total) under the tongue every 5 (five) minutes as needed for chest pain. 25 tablet 3   No facility-administered medications prior to visit.     Review of Systems:   Constitutional:   No  weight loss, night sweats,  Fevers, chills, + fatigue, or  lassitude.  HEENT:   No headaches,  Difficulty swallowing,  Tooth/dental problems, or  Sore throat,                No sneezing, itching, ear ache, nasal congestion, post nasal drip,   CV:  No chest pain,  Orthopnea, PND, swelling in lower extremities, anasarca, dizziness, palpitations, syncope.   GI  No heartburn, indigestion, abdominal pain, nausea, vomiting, diarrhea, change in bowel habits, loss of appetite, bloody stools.   Resp: No shortness of breath with exertion or at rest.  No excess mucus, no productive cough,  No non-productive cough,  No coughing up of blood.  No change in color of mucus.  No wheezing.  No chest wall deformity  Skin: no rash or lesions.  GU: no dysuria, change in color of urine, no urgency or frequency.  No flank pain, no hematuria   MS:   Recent right below the knee amputation.    Physical Exam  BP 108/60 (BP Location: Right Arm, Cuff Size: Normal)   Pulse (!) 115   Temp 98.1 F (36.7 C) (Temporal)   Ht 5\' 10"  (1.778 m)   Wt 165 lb 5.5 oz (75 kg)   SpO2 98%   BMI 23.72 kg/m   GEN: A/Ox3; pleasant , NAD, well nourished , wheelchair   HEENT:  [place]/AT,    NOSE-clear, THROAT-clear, no lesions, no postnasal drip or exudate noted.   NECK:  Supple w/ fair ROM; no JVD; normal carotid impulses w/o bruits; no thyromegaly or nodules palpated; no  lymphadenopathy.    RESP  Clear  P & A; w/o, wheezes/ rales/ or rhonchi. no accessory muscle use, no dullness to percussion  CARD:  RRR, no  m/r/g, no peripheral edema on left, pulses intact, no cyanosis or clubbing.  GI:   Soft & nt; nml bowel sounds; no organomegaly or masses detected.   Musco: Warm bil, right leg brace with stump bandaged  Neuro: alert, no focal deficits noted.    Skin: Warm, no lesions or rashes    Lab Results:   BNP  ProBNP No results found for: PROBNP  Imaging: No results found.    No flowsheet data found.  No results found for: NITRICOXIDE      Assessment & Plan:   OSA (obstructive sleep apnea) Obstructive sleep apnea.  Patient has excellent compliance on CPAP.  Recently has received a new CPAP.  He is currently at 13 cm H2O.  Patient has ongoing events despite good compliance.  Will adjust CPAP pressure 10 to 20 cm H2O. Get a CPAP download in 1 month.  If continues to have a number of events.  Could consider setting up for CPAP titration study.  Of note patient does have underlying congestive heart failure and is also using pain medications which may be contributing to the number of events he has.  Download did show 3.8 central events and 4.9 obstructive events and 7.9 unknowns.  Plan  Patient Instructions  Change CPAP to autoset 10 to 20cmH2O  CPAP download in 1 month.  Healthy sleep regimen  Follow up with Dr. Mortimer Fries in 3 months and As needed  [facility] office )         Bronchitis Recent episode of COVID-19 with residual cough and possible bronchitis.  Chest x-ray had has a few abnormalities.  Subsequent CT chest showed no cavitary lesions and only some minimum scarring and left lower lobe bronchiectasis.     Rexene Edison, NP 05/24/2020

## 2020-05-24 NOTE — Patient Instructions (Addendum)
Change CPAP to autoset 10 to 20cmH2O  CPAP download in 1 month.  Healthy sleep regimen  Follow up with Dr. Mortimer Fries in 3 months and As needed  [facility] office )

## 2020-05-24 NOTE — Assessment & Plan Note (Signed)
Obstructive sleep apnea.  Patient has excellent compliance on CPAP.  Recently has received a new CPAP.  He is currently at 13 cm H2O.  Patient has ongoing events despite good compliance.  Will adjust CPAP pressure 10 to 20 cm H2O. Get a CPAP download in 1 month.  If continues to have a number of events.  Could consider setting up for CPAP titration study.  Of note patient does have underlying congestive heart failure and is also using pain medications which may be contributing to the number of events he has.  Download did show 3.8 central events and 4.9 obstructive events and 7.9 unknowns.  Plan  Patient Instructions  Change CPAP to autoset 10 to 20cmH2O  CPAP download in 1 month.  Healthy sleep regimen  Follow up with Dr. Mortimer Fries in 3 months and As needed  [facility] office )

## 2020-05-24 NOTE — Assessment & Plan Note (Signed)
Recent episode of COVID-19 with residual cough and possible bronchitis.  Chest x-ray had has a few abnormalities.  Subsequent CT chest showed no cavitary lesions and only some minimum scarring and left lower lobe bronchiectasis.

## 2020-05-25 ENCOUNTER — Other Ambulatory Visit: Payer: Self-pay

## 2020-05-25 DIAGNOSIS — D631 Anemia in chronic kidney disease: Secondary | ICD-10-CM | POA: Diagnosis not present

## 2020-05-25 DIAGNOSIS — N2581 Secondary hyperparathyroidism of renal origin: Secondary | ICD-10-CM | POA: Diagnosis not present

## 2020-05-25 DIAGNOSIS — Z992 Dependence on renal dialysis: Secondary | ICD-10-CM | POA: Diagnosis not present

## 2020-05-25 DIAGNOSIS — N186 End stage renal disease: Secondary | ICD-10-CM | POA: Diagnosis not present

## 2020-05-28 DIAGNOSIS — D631 Anemia in chronic kidney disease: Secondary | ICD-10-CM | POA: Diagnosis not present

## 2020-05-28 DIAGNOSIS — N186 End stage renal disease: Secondary | ICD-10-CM | POA: Diagnosis not present

## 2020-05-28 DIAGNOSIS — N2581 Secondary hyperparathyroidism of renal origin: Secondary | ICD-10-CM | POA: Diagnosis not present

## 2020-05-28 DIAGNOSIS — Z992 Dependence on renal dialysis: Secondary | ICD-10-CM | POA: Diagnosis not present

## 2020-05-29 ENCOUNTER — Encounter: Payer: Self-pay | Admitting: Family Medicine

## 2020-05-29 ENCOUNTER — Ambulatory Visit (INDEPENDENT_AMBULATORY_CARE_PROVIDER_SITE_OTHER): Payer: Medicare Other | Admitting: Family Medicine

## 2020-05-29 ENCOUNTER — Other Ambulatory Visit: Payer: Self-pay

## 2020-05-29 DIAGNOSIS — I480 Paroxysmal atrial fibrillation: Secondary | ICD-10-CM | POA: Diagnosis not present

## 2020-05-29 DIAGNOSIS — R634 Abnormal weight loss: Secondary | ICD-10-CM | POA: Diagnosis not present

## 2020-05-29 DIAGNOSIS — N186 End stage renal disease: Secondary | ICD-10-CM | POA: Diagnosis not present

## 2020-05-29 DIAGNOSIS — I132 Hypertensive heart and chronic kidney disease with heart failure and with stage 5 chronic kidney disease, or end stage renal disease: Secondary | ICD-10-CM | POA: Diagnosis not present

## 2020-05-29 DIAGNOSIS — I5020 Unspecified systolic (congestive) heart failure: Secondary | ICD-10-CM | POA: Diagnosis not present

## 2020-05-29 DIAGNOSIS — Z4781 Encounter for orthopedic aftercare following surgical amputation: Secondary | ICD-10-CM | POA: Diagnosis not present

## 2020-05-29 DIAGNOSIS — S88112A Complete traumatic amputation at level between knee and ankle, left lower leg, initial encounter: Secondary | ICD-10-CM

## 2020-05-29 DIAGNOSIS — D631 Anemia in chronic kidney disease: Secondary | ICD-10-CM | POA: Diagnosis not present

## 2020-05-29 DIAGNOSIS — I2510 Atherosclerotic heart disease of native coronary artery without angina pectoris: Secondary | ICD-10-CM | POA: Diagnosis not present

## 2020-05-29 LAB — POCT INR: INR: 2.9 (ref 2.0–3.0)

## 2020-05-29 NOTE — Patient Instructions (Signed)
Nice to see you. Please see vascular surgery as planned. Please contact your cardiologist to let them know that you are on Megace through your nephrologist.  Please get their input on whether or not you should continue on this medication.

## 2020-05-29 NOTE — Progress Notes (Signed)
Tommi Rumps, MD Phone: [phone]  Jeremy [name] is a 65 y.o. male who presents today for follow-up.  Amputation: Patient underwent a BKA.  Staples are still in place.  He has followed up with vascular surgery and there was noted to be significant eschar and maceration of the stump.  They have been working on allowing the area to dry out more.  They are using saline.  No more Xeroform.  There is a mild stinging sensation though no significant pain.  No fevers.  No purulent drainage.  No erythema.  He is taking oxycodone for the discomfort.  Occasionally he will take Tylenol.  Weight loss: Patient notes his appetite was not good for quite a while.  He has started to put some weight back on after his nephrologist office started him on Megace.  ESRD on dialysis: Patient continues Monday Wednesday Friday dialysis.  Notes this is going well.  Social History   Tobacco Use  Smoking Status Never Smoker  Smokeless Tobacco Never Used    Current Outpatient Medications on File Prior to Visit  Medication Sig Dispense Refill  . acetaminophen (TYLENOL) 500 MG tablet Take 1 tablet (500 mg total) by mouth every 4 (four) hours as needed. 30 tablet 0  . allopurinol (ZYLOPRIM) 100 MG tablet Take 1 tablet (100 mg total) by mouth daily. 30 tablet 0  . CHOLECALCIFEROL PO Take by mouth.    . clopidogrel (PLAVIX) 75 MG tablet Take 1 tablet (75 mg total) by mouth daily with breakfast. 90 tablet 3  . ezetimibe (ZETIA) 10 MG tablet TAKE 1 TABLET BY MOUTH EVERY DAY (Patient taking differently: Take 10 mg by mouth daily.) 90 tablet 1  . hydroxychloroquine (PLAQUENIL) 200 MG tablet Take 400 mg by mouth at bedtime.    . lamoTRIgine (LAMICTAL) 25 MG tablet Take 2 tablets (50 mg total) by mouth 2 (two) times daily. 120 tablet 0  . lidocaine-prilocaine (EMLA) cream Apply 1 application topically daily as needed (for fistula).    . loperamide (IMODIUM A-D) 2 MG tablet Take 2 mg by mouth 4 (four) times daily as needed  for diarrhea or loose stools.    . megestrol (MEGACE) 40 MG/ML suspension TAKE 10 ML (400 MG TOTAL) BY MOUTH DAILY.    . metoprolol tartrate 75 MG TABS Take 75 mg by mouth 2 (two) times daily. 180 tablet 3  . multivitamin (RENA-VIT) TABS tablet Take 1 tablet by mouth daily.    Marland Kitchen oxyCODONE-acetaminophen (PERCOCET/ROXICET) 5-325 MG tablet     . pantoprazole (PROTONIX) 40 MG tablet Take 1 tablet (40 mg total) by mouth daily. 90 tablet 3  . polyethylene glycol (MIRALAX / GLYCOLAX) 17 g packet Take 17 g by mouth daily as needed for mild constipation. 30 each 0  . predniSONE (DELTASONE) 5 MG tablet Take 5 mg by mouth daily.    . rosuvastatin (CRESTOR) 40 MG tablet Take 1 tablet (40 mg total) by mouth daily at 6 PM. 90 tablet 3  . sevelamer carbonate (RENVELA) 800 MG tablet Take 3,200 mg by mouth 3 (three) times daily with meals.    . traZODone (DESYREL) 50 MG tablet TAKE 1/2 -1 TABLET (25-50 MG TOTAL) BY MOUTH AT BEDTIME AS NEEDED FOR SLEEP. (Patient taking differently: Take 25-50 mg by mouth at bedtime.) 90 tablet 2  . warfarin (COUMADIN) 1 MG tablet Take 1/2 tablet twice a week on Mondays and Thursdays--take with supper 90 tablet 3  . nitroGLYCERIN (NITROSTAT) 0.4 MG SL tablet Place 1 tablet (  0.4 mg total) under the tongue every 5 (five) minutes as needed for chest pain. 25 tablet 3   No current facility-administered medications on file prior to visit.     ROS see history of present illness  Objective  Physical Exam Vitals:   05/29/20 1110  BP: 124/76  Pulse: 76  Temp: 98.2 F (36.8 C)    BP Readings from Last 3 Encounters:  05/29/20 124/76  05/24/20 108/60  05/22/20 117/72   Wt Readings from Last 3 Encounters:  05/29/20 156 lb 5 oz (70.9 kg)  05/24/20 165 lb 5.5 oz (75 kg)  05/03/20 158 lb 15.2 oz (72.1 kg)    Physical Exam Constitutional:      General: He is not in acute distress.    Appearance: He is not diaphoretic.  Cardiovascular:     Rate and Rhythm: Normal rate.  Rhythm irregularly irregular.     Heart sounds: Normal heart sounds.  Pulmonary:     Effort: Pulmonary effort is normal.     Breath sounds: Normal breath sounds.  Musculoskeletal:        General: No edema.  Skin:    General: Skin is warm and dry.  Neurological:     Mental Status: He is alert.     Right leg, no apparent surrounding erythema, warmth, or purulent drainage   Assessment/Plan: Please see individual problem list.  Problem List Items Addressed This Visit    Atrial fibrillation ([place])    Rate controlled.  He will continue his current Coumadin regimen.  His most recent INR is acceptable.  He will continue to see the Coumadin clinic at cardiology.      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  Appears to have some issues with appropriate healing.  Does have eschar in place.  There is no signs of infection today.  I discussed that he needs to follow-up with vascular surgery as planned.  Discussed that it is relatively likely that he will need at least a revision given poor wound healing.  They will monitor for signs of infection and if those occur they will seek medical attention immediately.  I advised that he needs to really monitor how much Tylenol he takes in and that he should not take in more than 3000 mg a day.      ESRD (end stage renal disease) [facility])    Patient will continue dialysis with nephrology.      Weight loss    The patient reports his appetite has improved and his weight has stabilized or improved with the Megace.  The patient was not aware of risk of blood clot with the Megace.  I informed him of this today.  Discussed that his risk is likely lower given that he is anticoagulated with warfarin.  He will contact his cardiologist as well to see if they are okay with him being on the Megace.         This visit occurred during the SARS-CoV-2 public health emergency.  Safety protocols were in place, including  screening questions prior to the visit, additional usage of staff PPE, and extensive cleaning of exam room while observing appropriate contact time as indicated for disinfecting solutions.    Tommi Rumps, MD [place]

## 2020-05-30 ENCOUNTER — Ambulatory Visit (INDEPENDENT_AMBULATORY_CARE_PROVIDER_SITE_OTHER): Payer: Medicare Other

## 2020-05-30 DIAGNOSIS — D631 Anemia in chronic kidney disease: Secondary | ICD-10-CM | POA: Diagnosis not present

## 2020-05-30 DIAGNOSIS — N2581 Secondary hyperparathyroidism of renal origin: Secondary | ICD-10-CM | POA: Diagnosis not present

## 2020-05-30 DIAGNOSIS — N186 End stage renal disease: Secondary | ICD-10-CM | POA: Diagnosis not present

## 2020-05-30 DIAGNOSIS — Z5181 Encounter for therapeutic drug level monitoring: Secondary | ICD-10-CM

## 2020-05-30 DIAGNOSIS — I4891 Unspecified atrial fibrillation: Secondary | ICD-10-CM

## 2020-05-30 DIAGNOSIS — Z992 Dependence on renal dialysis: Secondary | ICD-10-CM | POA: Diagnosis not present

## 2020-05-30 DIAGNOSIS — Z952 Presence of prosthetic heart valve: Secondary | ICD-10-CM

## 2020-05-30 NOTE — Patient Instructions (Signed)
Sent MyChart message to pt advising him to  - continue warfarin dosage of 1 mg tablet every day. - recheck INR in 1 week

## 2020-05-31 ENCOUNTER — Inpatient Hospital Stay: Payer: Medicare Other | Admitting: Physical Medicine & Rehabilitation

## 2020-05-31 DIAGNOSIS — I2510 Atherosclerotic heart disease of native coronary artery without angina pectoris: Secondary | ICD-10-CM | POA: Diagnosis not present

## 2020-05-31 DIAGNOSIS — I5020 Unspecified systolic (congestive) heart failure: Secondary | ICD-10-CM | POA: Diagnosis not present

## 2020-05-31 DIAGNOSIS — R634 Abnormal weight loss: Secondary | ICD-10-CM | POA: Insufficient documentation

## 2020-05-31 DIAGNOSIS — Z4781 Encounter for orthopedic aftercare following surgical amputation: Secondary | ICD-10-CM | POA: Diagnosis not present

## 2020-05-31 DIAGNOSIS — N186 End stage renal disease: Secondary | ICD-10-CM | POA: Diagnosis not present

## 2020-05-31 DIAGNOSIS — I132 Hypertensive heart and chronic kidney disease with heart failure and with stage 5 chronic kidney disease, or end stage renal disease: Secondary | ICD-10-CM | POA: Diagnosis not present

## 2020-05-31 DIAGNOSIS — D631 Anemia in chronic kidney disease: Secondary | ICD-10-CM | POA: Diagnosis not present

## 2020-05-31 NOTE — Assessment & Plan Note (Addendum)
The patient is status post BKA.  Appears to have some issues with appropriate healing.  Does have eschar in place.  There is no signs of infection today.  I discussed that he needs to follow-up with vascular surgery as planned.  Discussed that it is relatively likely that he will need at least a revision given poor wound healing.  They will monitor for signs of infection and if those occur they will seek medical attention immediately.  I advised that he needs to really monitor how much Tylenol he takes in and that he should not take in more than 3000 mg a day.

## 2020-05-31 NOTE — Assessment & Plan Note (Signed)
Patient will continue dialysis with nephrology.

## 2020-05-31 NOTE — Assessment & Plan Note (Signed)
The patient reports his appetite has improved and his weight has stabilized or improved with the Megace.  The patient was not aware of risk of blood clot with the Megace.  I informed him of this today.  Discussed that his risk is likely lower given that he is anticoagulated with warfarin.  He will contact his cardiologist as well to see if they are okay with him being on the Megace.

## 2020-05-31 NOTE — Assessment & Plan Note (Signed)
Rate controlled.  He will continue his current Coumadin regimen.  His most recent INR is acceptable.  He will continue to see the Coumadin clinic at cardiology.

## 2020-06-01 DIAGNOSIS — D631 Anemia in chronic kidney disease: Secondary | ICD-10-CM | POA: Diagnosis not present

## 2020-06-01 DIAGNOSIS — N2581 Secondary hyperparathyroidism of renal origin: Secondary | ICD-10-CM | POA: Diagnosis not present

## 2020-06-01 DIAGNOSIS — N186 End stage renal disease: Secondary | ICD-10-CM | POA: Diagnosis not present

## 2020-06-01 DIAGNOSIS — Z992 Dependence on renal dialysis: Secondary | ICD-10-CM | POA: Diagnosis not present

## 2020-06-03 IMAGING — US US SCROTUM W/ DOPPLER COMPLETE
1 series · 14 of 25 positions shown · non-contrast
Comparison: No prior.

CLINICAL DATA: Left hydrocele.

EXAM:
SCROTAL ULTRASOUND
DOPPLER ULTRASOUND OF THE TESTICLES
TECHNIQUE: Complete ultrasound examination of the testicles, epididymis, and
other scrotal structures was performed. Color and spectral Doppler
ultrasound were also utilized to evaluate blood flow to the
testicles.

[Series 1: us scrotum w/ doppler complete · 59 acquisitions, 14 frames shown]
[im 1/59]
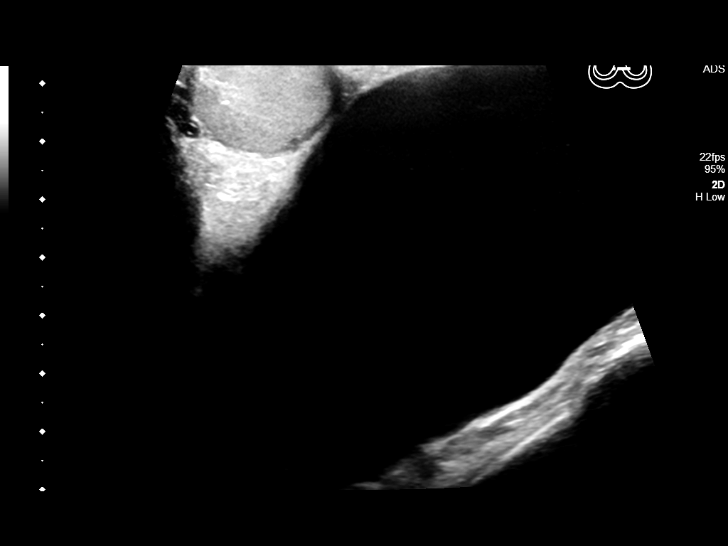
[im 5/59]
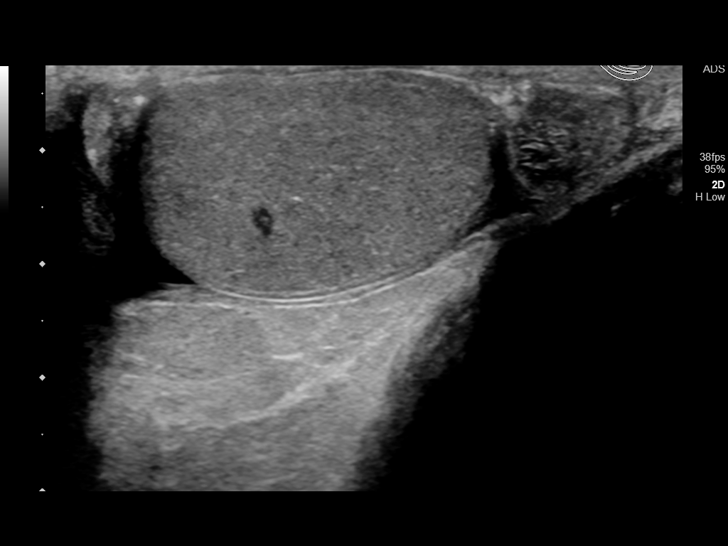
[im 10/59]
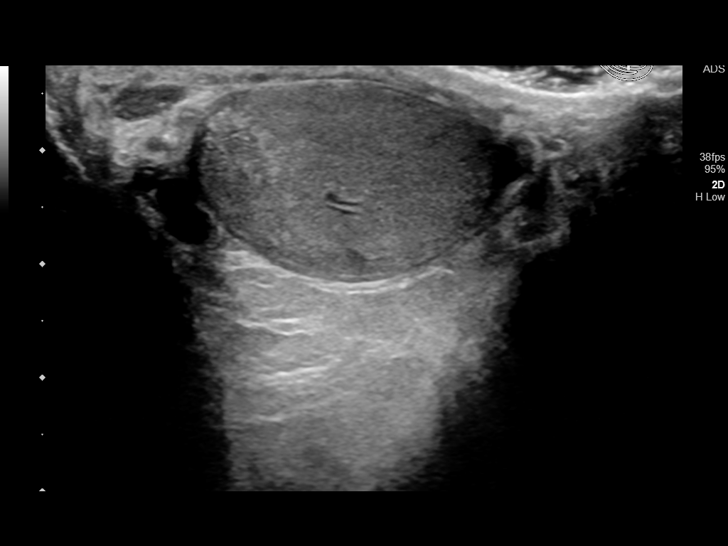
[im 15/59]
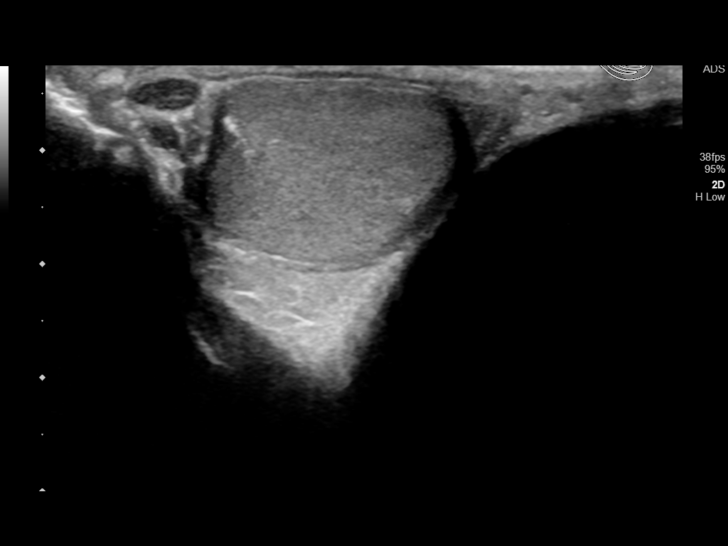
[im 20/59]
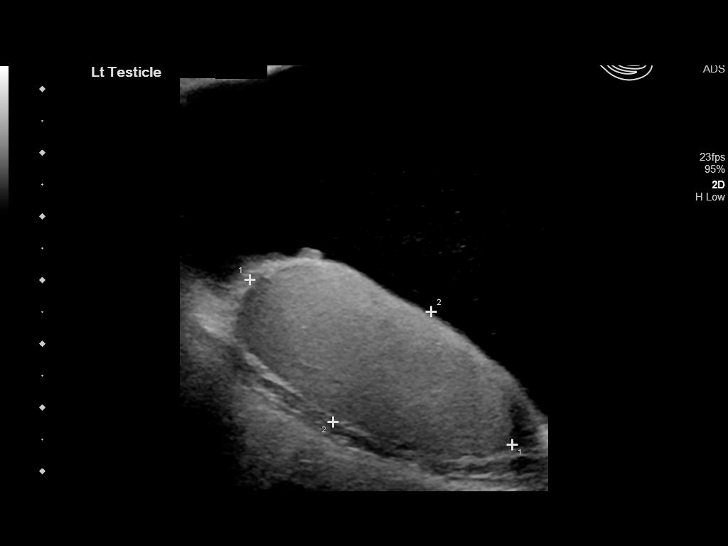
[im 22/59]
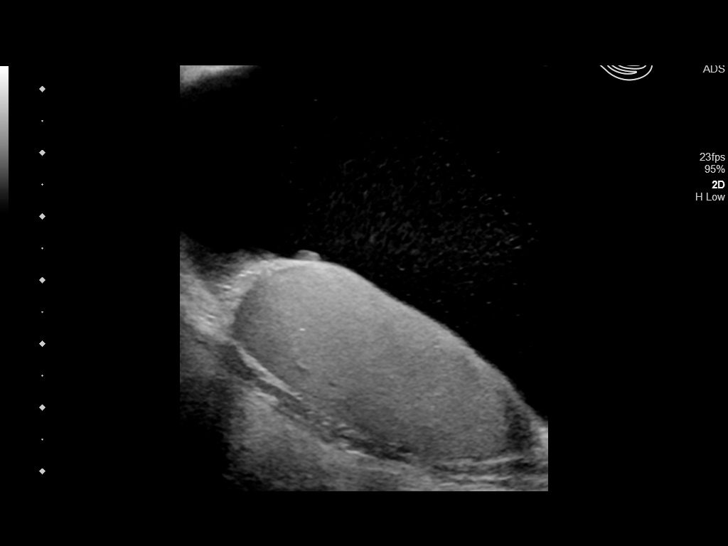
[im 27/59]
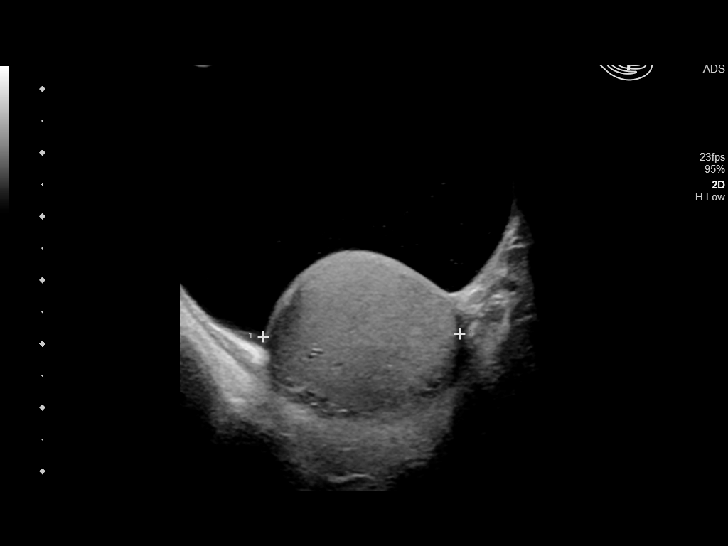
[im 32/59]
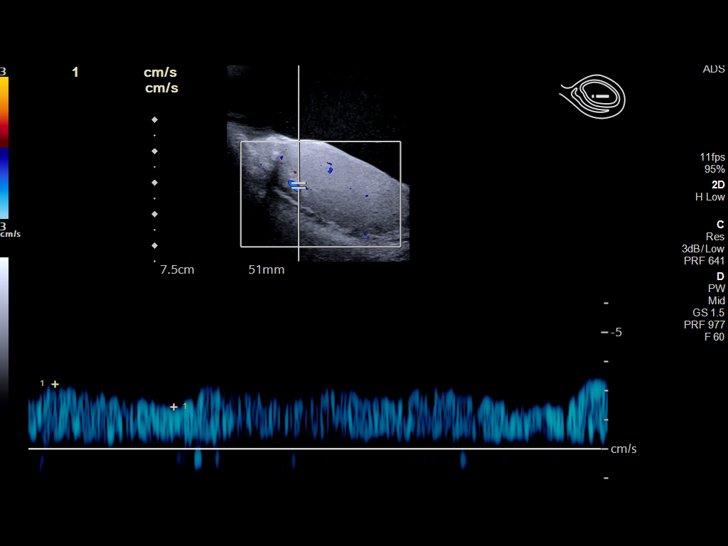
[im 37/59]
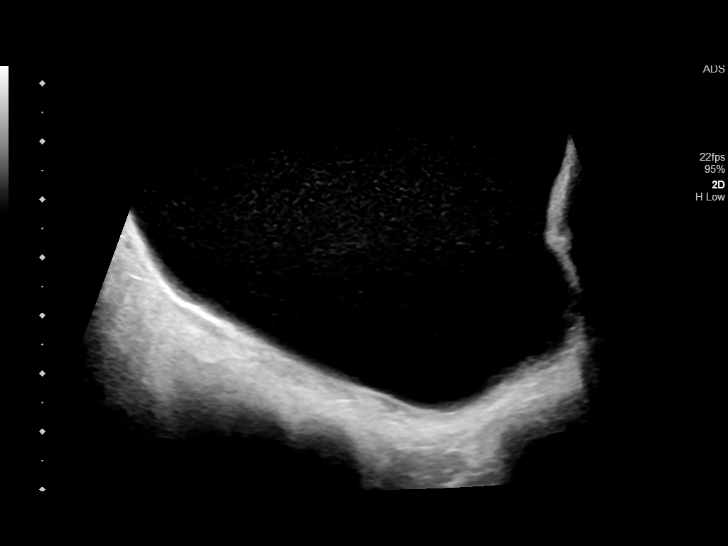
[im 39/59]
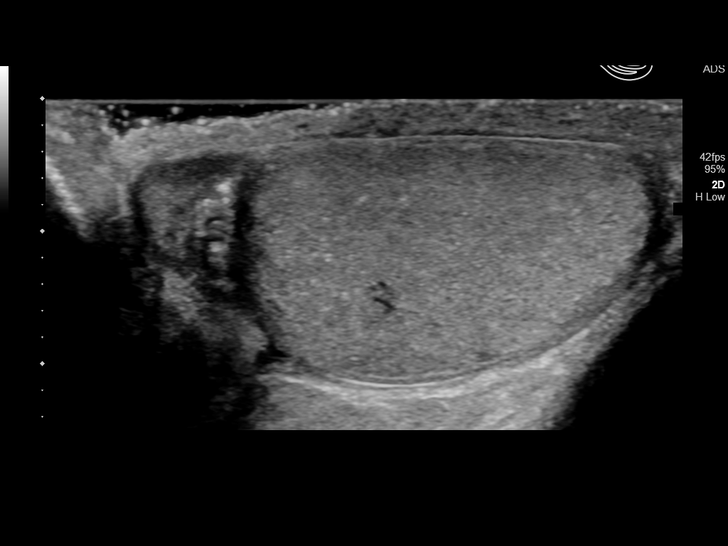
[im 44/59]
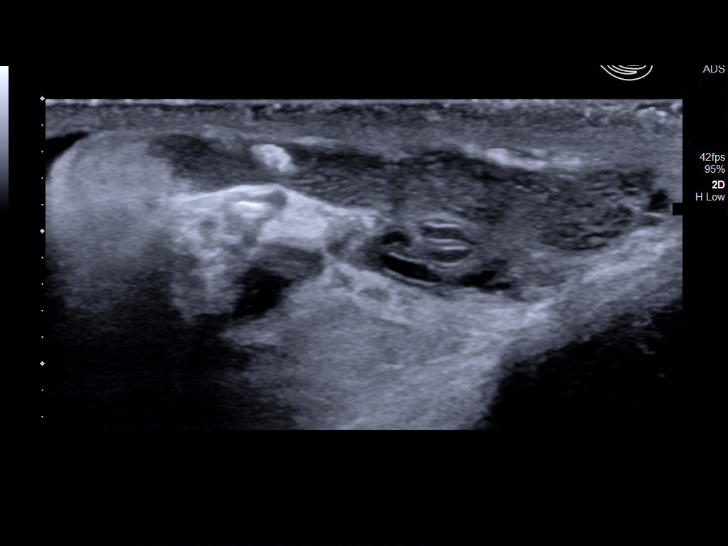
[im 49/59]
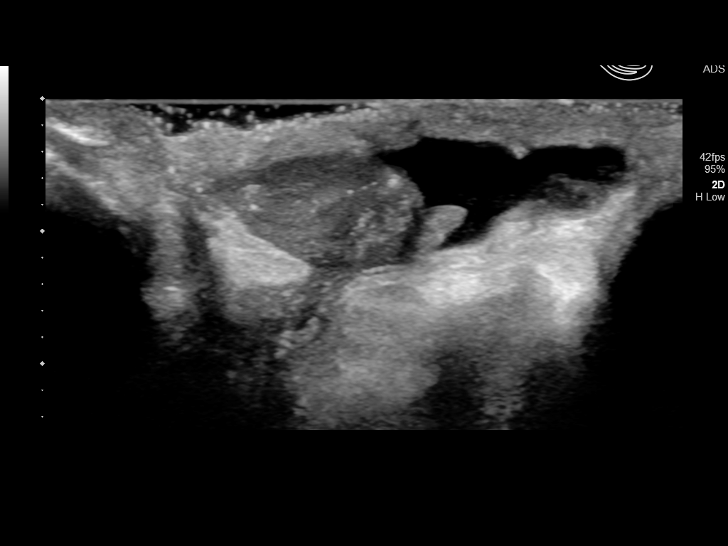
[im 54/59]
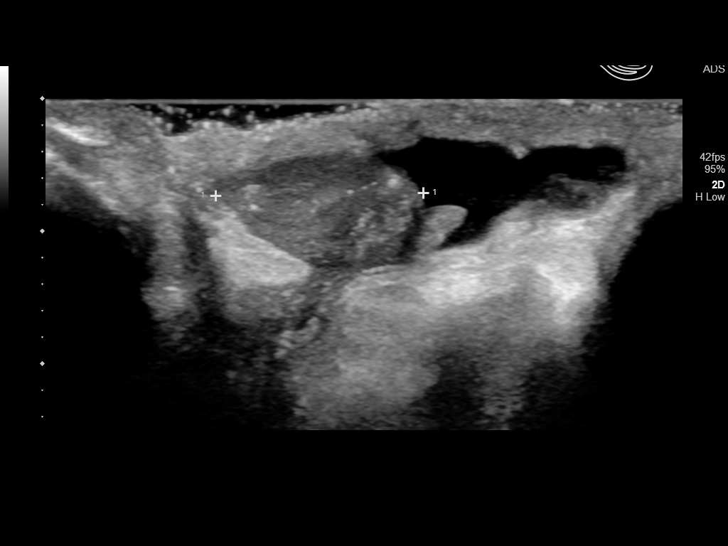
[im 59/59]
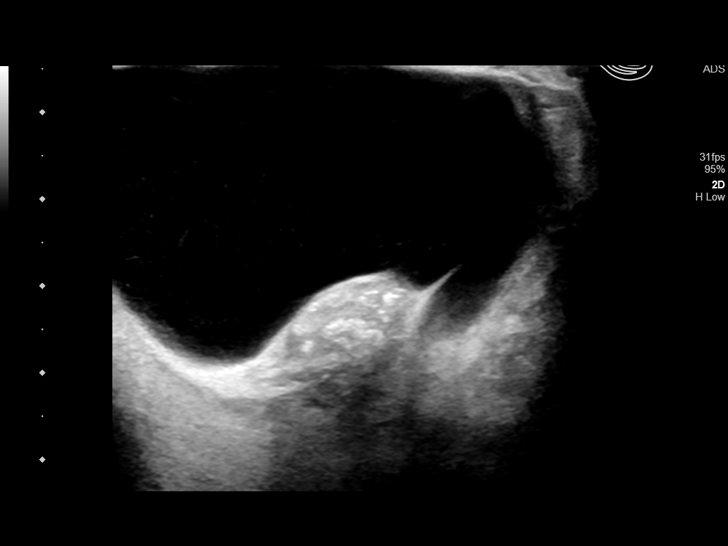

[14 of 25 positions shown; findings below may reference images not displayed]

FINDINGS: Right testicle

Measurements: 3.1 x 1.92.6 cm. No mass punctate calcifications
noted.

Left testicle

Measurements: 4.9 x 2.3 x 3.1 cm. No mass or microlithiasis
visualized.

Right epididymis: Slightly heterogeneous right epididymis noted.
Rounded echogenic focus noted in the epididymis most likely focal
area of fat.

Left epididymis: Difficult to visualize due to large left hydrocele.

Hydrocele:  Large left hydrocele.

Varicocele:  None visualized.

Pulsed Doppler interrogation of both testes demonstrates normal low
resistance arterial and venous waveforms bilaterally.
IMPRESSION: 1.  Large left hydrocele.

2. Heterogeneous right epididymis. Clinical correlation suggested to
exclude right epididymitis.

## 2020-06-04 DIAGNOSIS — N186 End stage renal disease: Secondary | ICD-10-CM | POA: Diagnosis not present

## 2020-06-04 DIAGNOSIS — Z992 Dependence on renal dialysis: Secondary | ICD-10-CM | POA: Diagnosis not present

## 2020-06-04 DIAGNOSIS — D631 Anemia in chronic kidney disease: Secondary | ICD-10-CM | POA: Diagnosis not present

## 2020-06-04 DIAGNOSIS — T8610 Unspecified complication of kidney transplant: Secondary | ICD-10-CM | POA: Diagnosis not present

## 2020-06-04 DIAGNOSIS — N2581 Secondary hyperparathyroidism of renal origin: Secondary | ICD-10-CM | POA: Diagnosis not present

## 2020-06-05 ENCOUNTER — Ambulatory Visit: Payer: Medicare Other

## 2020-06-05 DIAGNOSIS — I132 Hypertensive heart and chronic kidney disease with heart failure and with stage 5 chronic kidney disease, or end stage renal disease: Secondary | ICD-10-CM | POA: Diagnosis not present

## 2020-06-05 DIAGNOSIS — Z4781 Encounter for orthopedic aftercare following surgical amputation: Secondary | ICD-10-CM | POA: Diagnosis not present

## 2020-06-05 DIAGNOSIS — I5020 Unspecified systolic (congestive) heart failure: Secondary | ICD-10-CM | POA: Diagnosis not present

## 2020-06-05 DIAGNOSIS — I2510 Atherosclerotic heart disease of native coronary artery without angina pectoris: Secondary | ICD-10-CM | POA: Diagnosis not present

## 2020-06-05 DIAGNOSIS — N186 End stage renal disease: Secondary | ICD-10-CM | POA: Diagnosis not present

## 2020-06-05 DIAGNOSIS — D631 Anemia in chronic kidney disease: Secondary | ICD-10-CM | POA: Diagnosis not present

## 2020-06-05 IMAGING — CT CT ABD-PELV W/O CM
2 of 4 series · 13 of 36 positions shown, 16 images · non-contrast
Comparison: Multiple prior studies, most recent 11/10/2018. Most
recent chest abdomen pelvis from 07/19/2016

CLINICAL DATA: History of renal cell carcinoma status post
bilateral nephrectomy. Post recurrence in transplanted kidney.
Follow-up exam.

EXAM:
CT CHEST, ABDOMEN AND PELVIS WITHOUT CONTRAST
TECHNIQUE: Multidetector CT imaging of the chest, abdomen and pelvis was
performed following the standard protocol without IV contrast.

[Series 504: cap wo st · axial · 0.82mm/px · z∈[-660,-110]mm · 10 of 136 slices shown, 13 images]
[im 13/136  mediastinal]
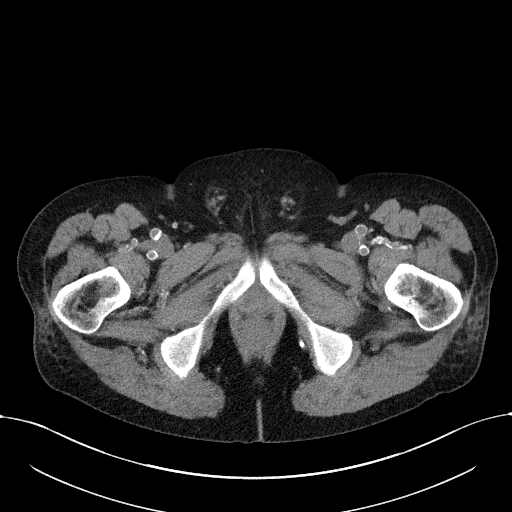
[im 13/136  lung]
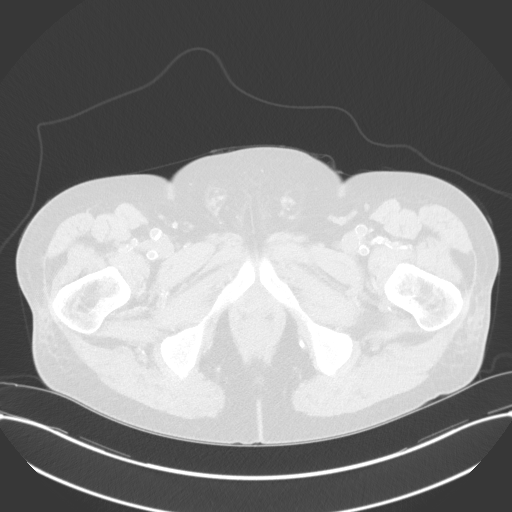
[im 25/136  lung]
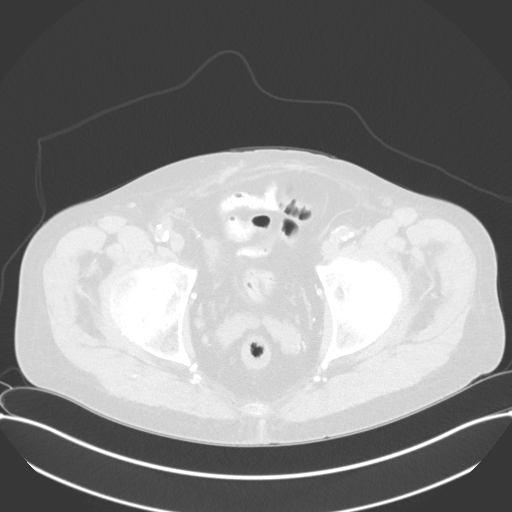
[im 37/136  lung]
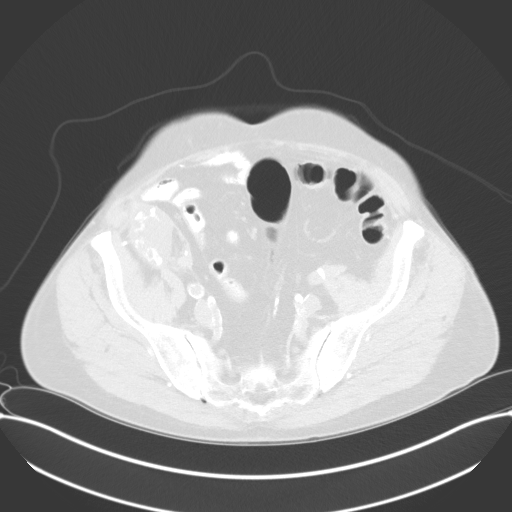
[im 50/136  lung]
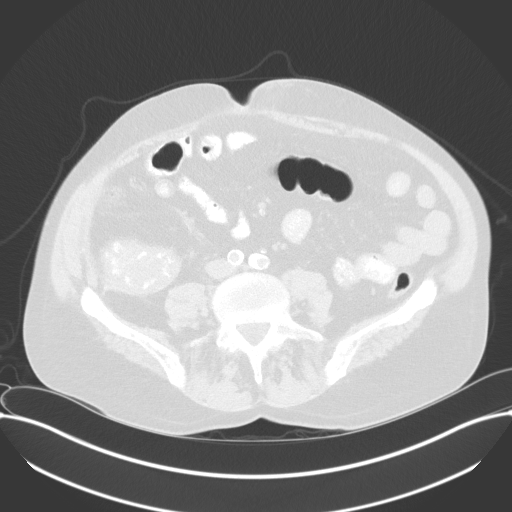
[im 62/136  mediastinal]
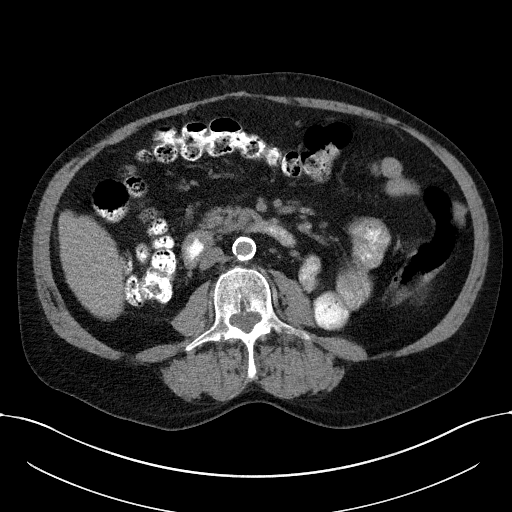
[im 62/136  lung]
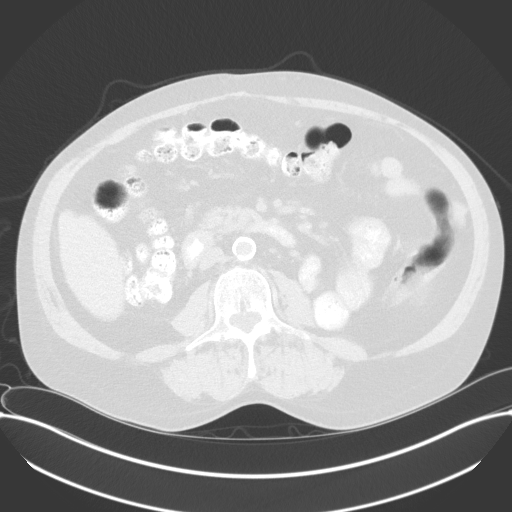
[im 74/136  lung]
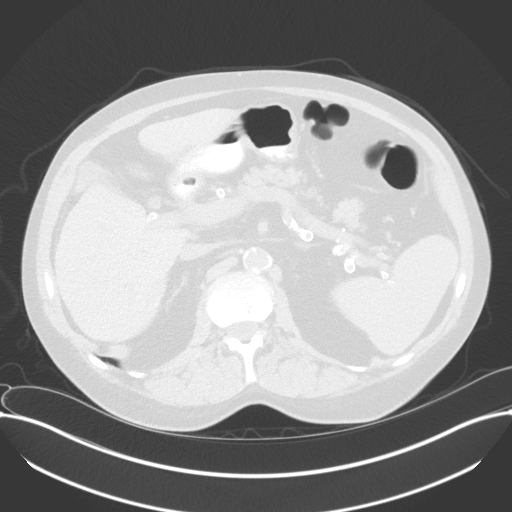
[im 86/136  lung]
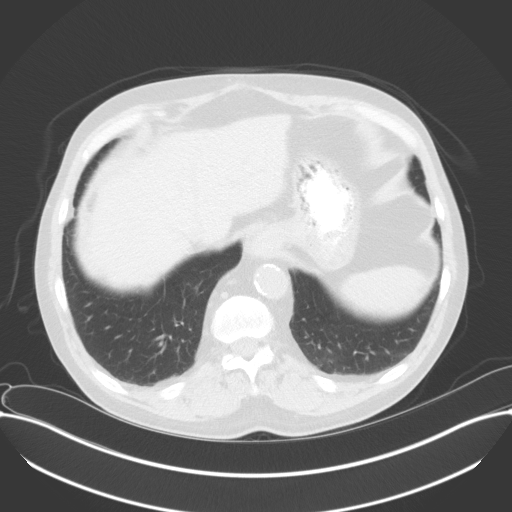
[im 99/136  lung]
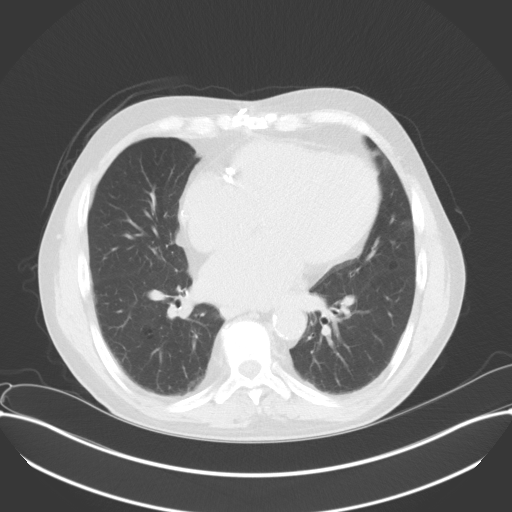
[im 111/136  mediastinal]
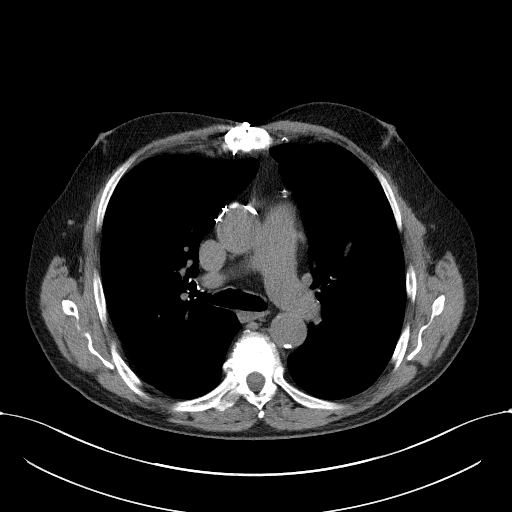
[im 111/136  lung]
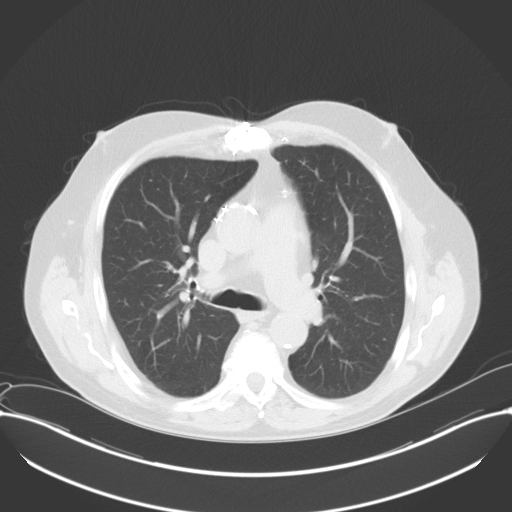
[im 123/136  lung]
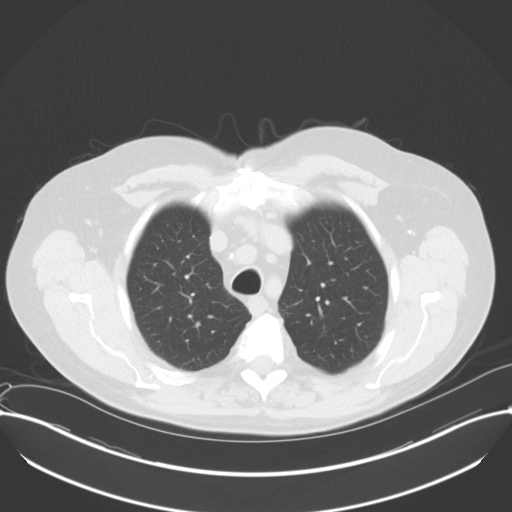

[Series 506: coronal · coronal · 0.86mm/px · 3 of 154 slices shown]
[im 31/154  lung]
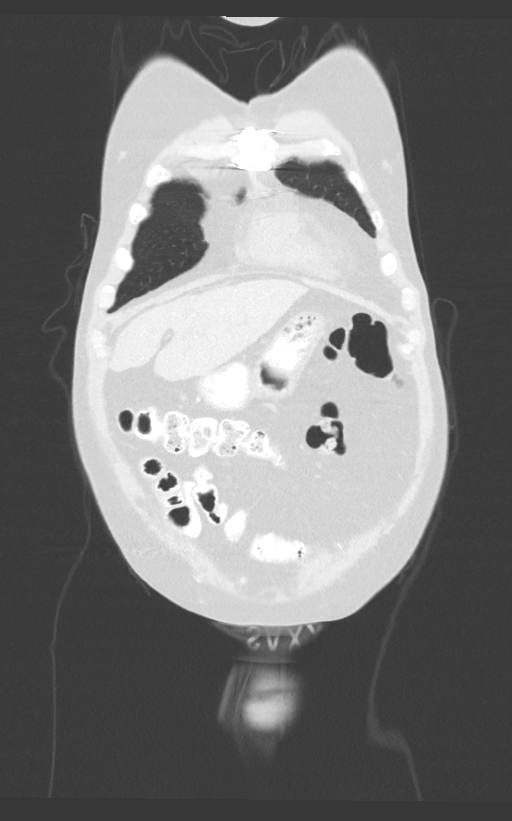
[im 62/154  lung]
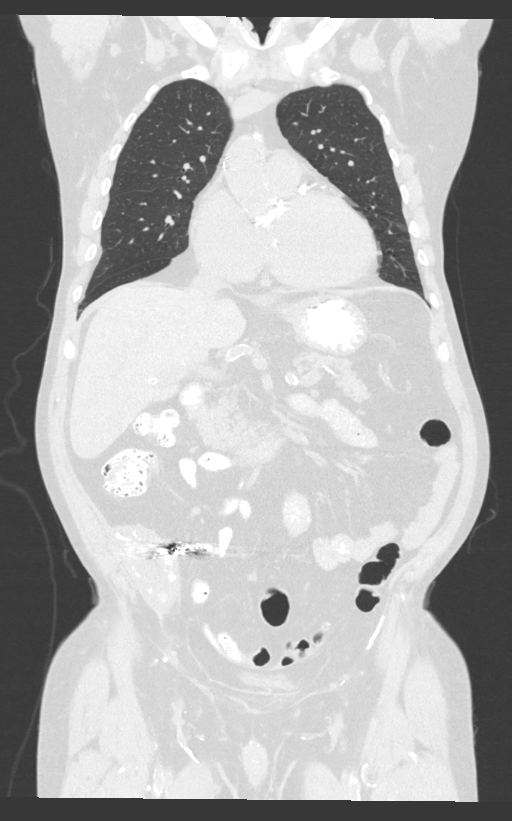
[im 92/154  lung]
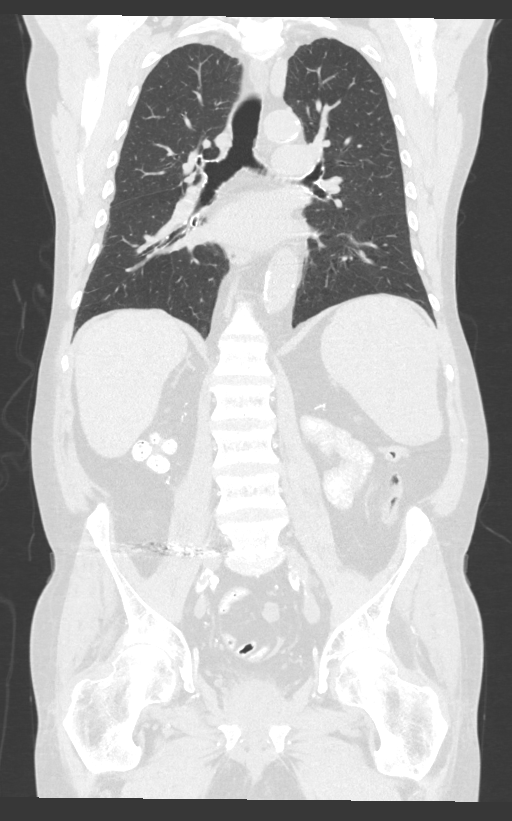

[13 of 36 positions shown; findings below may reference images not displayed]

FINDINGS: CT CHEST FINDINGS

Cardiovascular: Calcified atherosclerosis. Signs of aortic valve
replacement and coronary artery disease similar to prior exam. Heart
size enlarged without pericardial effusion.

Signs of coronary revascularization with CABG and [REDACTED] grafting.

Mediastinum/Nodes: Is no sign of adenopathy in the chest. The
esophagus is normal. Thoracic inlet structures and axillary regions
are unremarkable.

Lungs/Pleura: It has no signs of suspicious mass or nodule. No signs
of consolidation or pleural effusion. Airways are patent.

Musculoskeletal: No signs of chest wall mass.

CT ABDOMEN PELVIS FINDINGS

Hepatobiliary: No sign of focal, suspicious hepatic lesion. The
biliary tree is unremarkable aside from gallstones layering in the
dependent gallbladder.

Pancreas: No signs of pancreatic ductal dilation, inflammation or
focal lesion.

Spleen: Normal appearance of the spleen.

Adrenals/Urinary Tract: Adrenal glands are normal. Post bilateral
nephrectomy. No signs of nodularity in the nephrectomy bed.

Post embolized right lower quadrant renal transplant, associated
with calcification and atrophy is markedly atrophic as compared to
the study of 07/18/2016.

Stomach/Bowel: No signs of acute gastrointestinal process. The
appendix is normal.

Vascular/Lymphatic: Dense calcified atherosclerotic changes
throughout the abdominal aorta, extending into branch vessels in the
abdomen and pelvis. No signs of retroperitoneal or upper abdominal
lymphadenopathy.

No signs pelvic lymphadenopathy. Prostate is unremarkable on
noncontrast scan.

Reproductive: Prostate unremarkable as described. Urinary bladder
under distended.

Other: Postprocedural changes over the low abdomen. Small fat
containing inguinal hernias.

Musculoskeletal: Signs of renal osteodystrophy with coarsened bony
trabeculation.
IMPRESSION: 1. No evidence of metastatic disease.
2. Post embolized right lower quadrant renal transplant.
3. Cholelithiasis.
4. Cardiomegaly with signs of aortic valve replacement and coronary
revascularization as before.

Aortic Atherosclerosis (7P0VN-5H8.8).

## 2020-06-05 IMAGING — CT CT CHEST W/O CM
2 of 4 series · 13 of 36 positions shown, 16 images · non-contrast
Comparison: Multiple prior studies, most recent 11/10/2018. Most
recent chest abdomen pelvis from 07/19/2016

CLINICAL DATA: History of renal cell carcinoma status post
bilateral nephrectomy. Post recurrence in transplanted kidney.
Follow-up exam.

EXAM:
CT CHEST, ABDOMEN AND PELVIS WITHOUT CONTRAST
TECHNIQUE: Multidetector CT imaging of the chest, abdomen and pelvis was
performed following the standard protocol without IV contrast.

[Series 2: cap wo st · axial · 0.82mm/px · z∈[-660,-110]mm · 10 of 136 slices shown, 13 images]
[im 13/136  mediastinal]
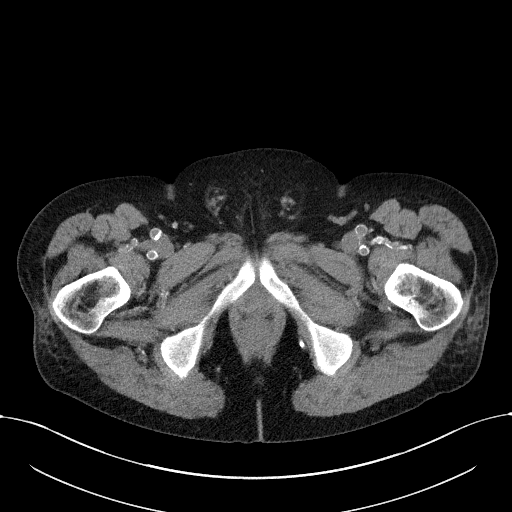
[im 13/136  lung]
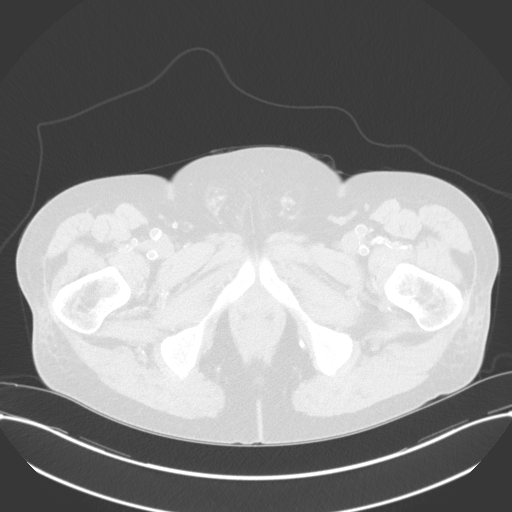
[im 25/136  lung]
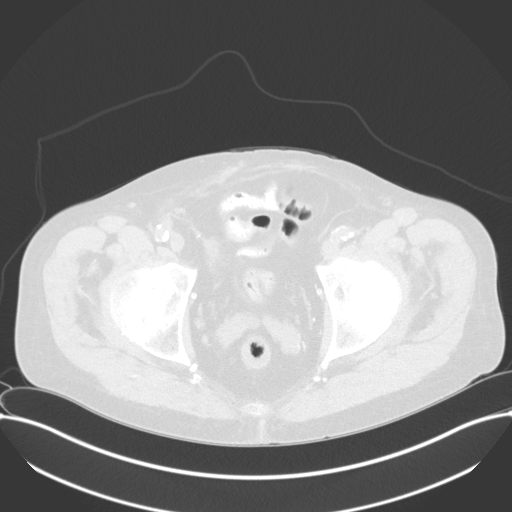
[im 37/136  lung]
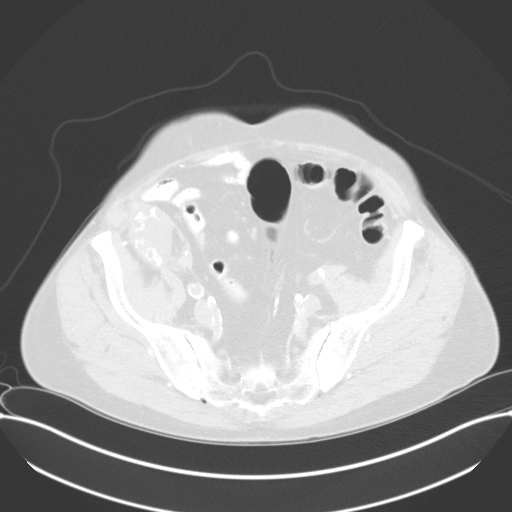
[im 50/136  lung]
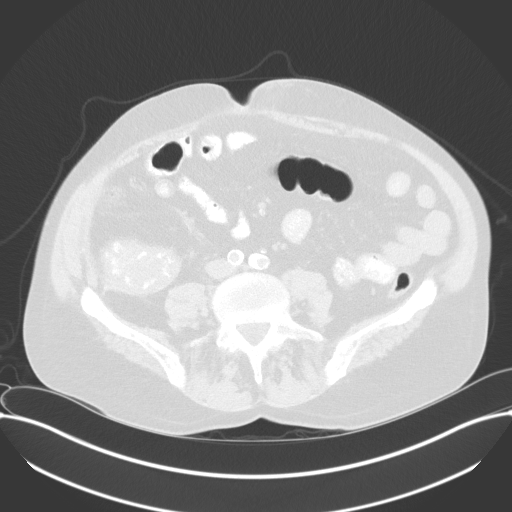
[im 62/136  mediastinal]
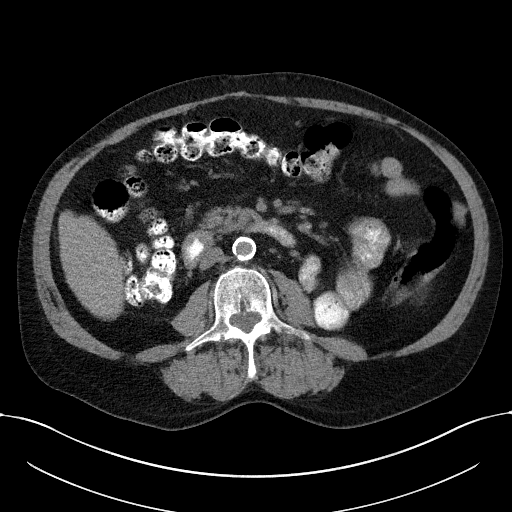
[im 62/136  lung]
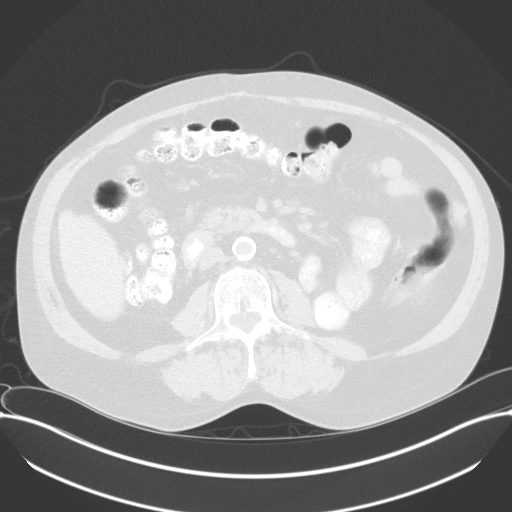
[im 74/136  lung]
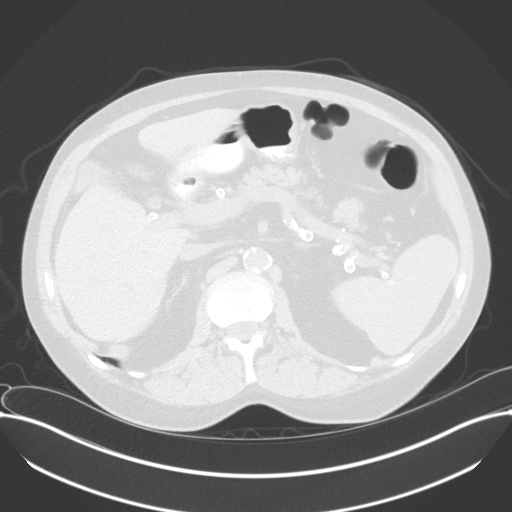
[im 86/136  lung]
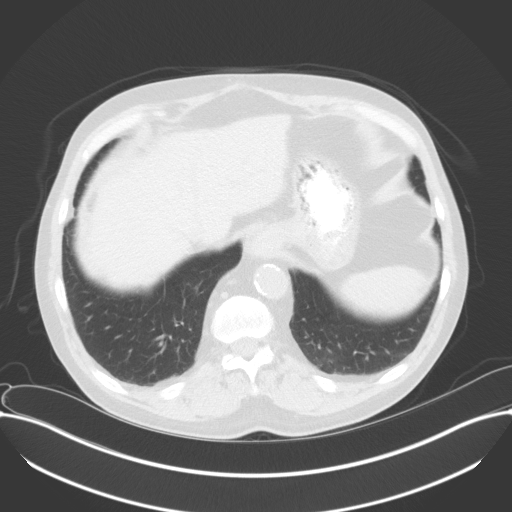
[im 99/136  lung]
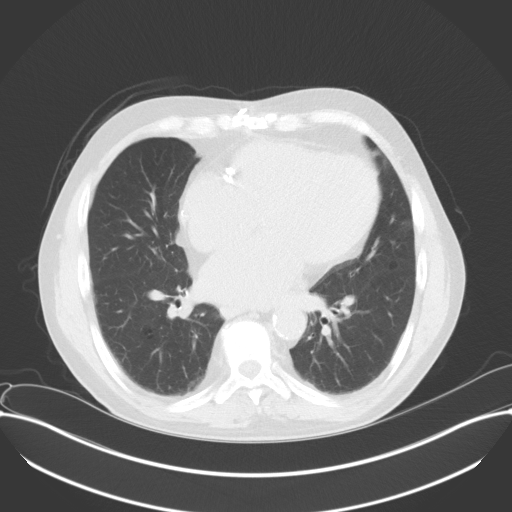
[im 111/136  mediastinal]
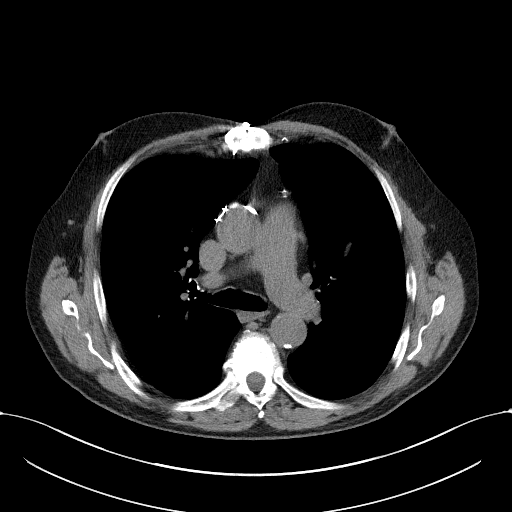
[im 111/136  lung]
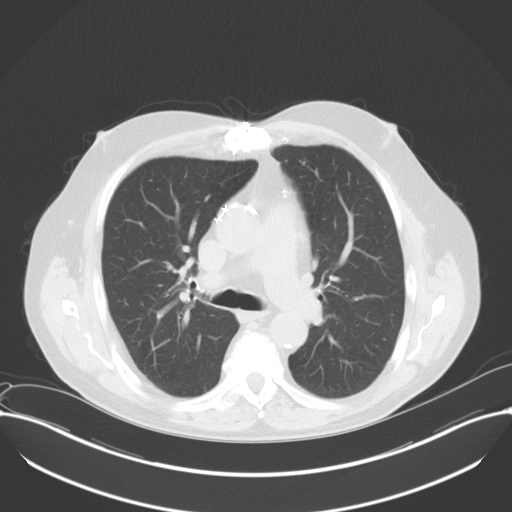
[im 123/136  lung]
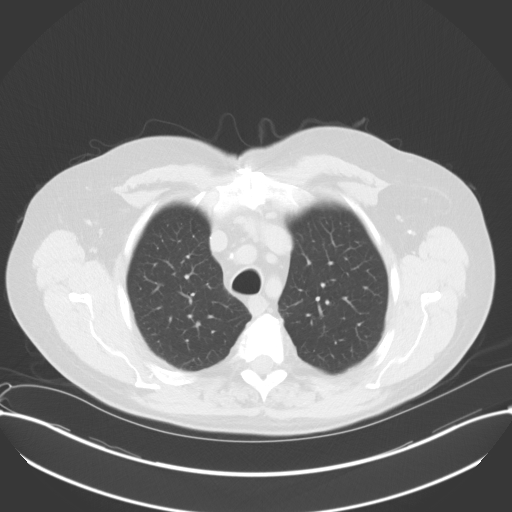

[Series 5: coronal · coronal · 0.86mm/px · 3 of 154 slices shown]
[im 31/154  lung]
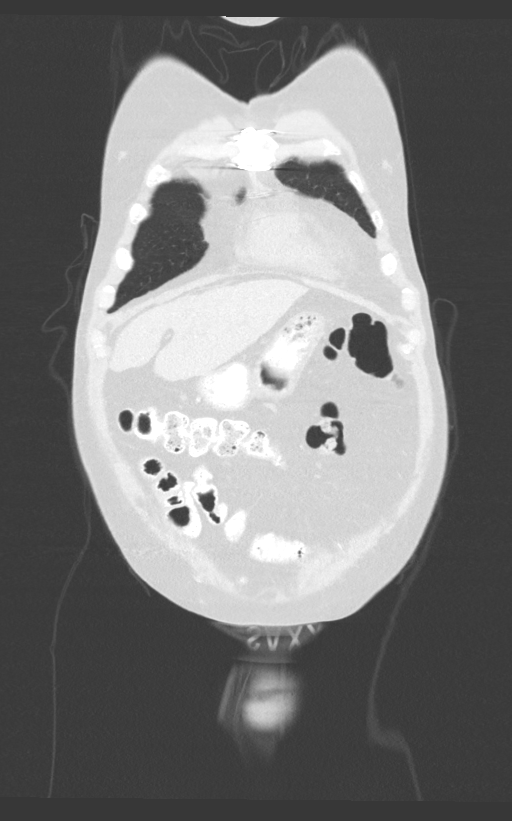
[im 62/154  lung]
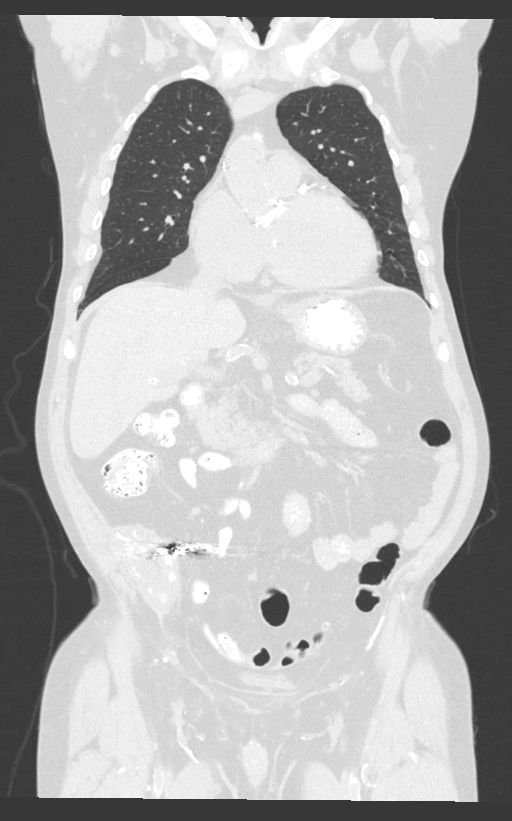
[im 92/154  lung]
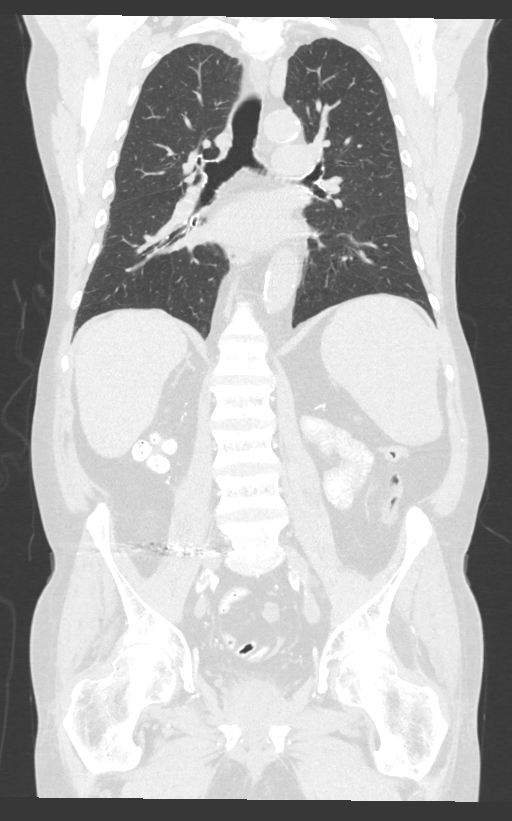

[13 of 36 positions shown; findings below may reference images not displayed]

FINDINGS: CT CHEST FINDINGS

Cardiovascular: Calcified atherosclerosis. Signs of aortic valve
replacement and coronary artery disease similar to prior exam. Heart
size enlarged without pericardial effusion.

Signs of coronary revascularization with CABG and [REDACTED] grafting.

Mediastinum/Nodes: Is no sign of adenopathy in the chest. The
esophagus is normal. Thoracic inlet structures and axillary regions
are unremarkable.

Lungs/Pleura: It has no signs of suspicious mass or nodule. No signs
of consolidation or pleural effusion. Airways are patent.

Musculoskeletal: No signs of chest wall mass.

CT ABDOMEN PELVIS FINDINGS

Hepatobiliary: No sign of focal, suspicious hepatic lesion. The
biliary tree is unremarkable aside from gallstones layering in the
dependent gallbladder.

Pancreas: No signs of pancreatic ductal dilation, inflammation or
focal lesion.

Spleen: Normal appearance of the spleen.

Adrenals/Urinary Tract: Adrenal glands are normal. Post bilateral
nephrectomy. No signs of nodularity in the nephrectomy bed.

Post embolized right lower quadrant renal transplant, associated
with calcification and atrophy is markedly atrophic as compared to
the study of 07/18/2016.

Stomach/Bowel: No signs of acute gastrointestinal process. The
appendix is normal.

Vascular/Lymphatic: Dense calcified atherosclerotic changes
throughout the abdominal aorta, extending into branch vessels in the
abdomen and pelvis. No signs of retroperitoneal or upper abdominal
lymphadenopathy.

No signs pelvic lymphadenopathy. Prostate is unremarkable on
noncontrast scan.

Reproductive: Prostate unremarkable as described. Urinary bladder
under distended.

Other: Postprocedural changes over the low abdomen. Small fat
containing inguinal hernias.

Musculoskeletal: Signs of renal osteodystrophy with coarsened bony
trabeculation.
IMPRESSION: 1. No evidence of metastatic disease.
2. Post embolized right lower quadrant renal transplant.
3. Cholelithiasis.
4. Cardiomegaly with signs of aortic valve replacement and coronary
revascularization as before.

Aortic Atherosclerosis (7P0VN-5H8.8).

## 2020-06-06 ENCOUNTER — Other Ambulatory Visit: Payer: Self-pay

## 2020-06-06 DIAGNOSIS — N2581 Secondary hyperparathyroidism of renal origin: Secondary | ICD-10-CM | POA: Diagnosis not present

## 2020-06-06 DIAGNOSIS — N186 End stage renal disease: Secondary | ICD-10-CM | POA: Diagnosis not present

## 2020-06-06 DIAGNOSIS — Z992 Dependence on renal dialysis: Secondary | ICD-10-CM | POA: Diagnosis not present

## 2020-06-06 DIAGNOSIS — D509 Iron deficiency anemia, unspecified: Secondary | ICD-10-CM | POA: Diagnosis not present

## 2020-06-06 DIAGNOSIS — D631 Anemia in chronic kidney disease: Secondary | ICD-10-CM | POA: Diagnosis not present

## 2020-06-06 MED ORDER — WARFARIN SODIUM 1 MG PO TABS: ORAL_TABLET | ORAL | 1 refills | Status: DC

## 2020-06-07 ENCOUNTER — Ambulatory Visit (HOSPITAL_COMMUNITY)
Admission: RE | Admit: 2020-06-07 | Discharge: 2020-06-07 | Disposition: A | Discharge status: Home / Self Care | Payer: Medicare Other | Source: Ambulatory Visit | Attending: Family Medicine | Admitting: Family Medicine

## 2020-06-07 ENCOUNTER — Ambulatory Visit (INDEPENDENT_AMBULATORY_CARE_PROVIDER_SITE_OTHER)
Admission: RE | Admit: 2020-06-07 | Discharge: 2020-06-07 | Disposition: A | Discharge status: Home / Self Care | Payer: Medicare Other | Source: Ambulatory Visit | Attending: Family Medicine | Admitting: Family Medicine

## 2020-06-07 ENCOUNTER — Encounter: Payer: Self-pay | Admitting: Physician Assistant

## 2020-06-07 ENCOUNTER — Ambulatory Visit (INDEPENDENT_AMBULATORY_CARE_PROVIDER_SITE_OTHER): Payer: Medicare Other | Admitting: Physician Assistant

## 2020-06-07 ENCOUNTER — Other Ambulatory Visit: Payer: Self-pay

## 2020-06-07 VITALS — BP 122/71 | HR 124 | Temp 98.7°F | Resp 20 | Ht 70.0 in | Wt 160.9 lb

## 2020-06-07 DIAGNOSIS — I350 Nonrheumatic aortic (valve) stenosis: Secondary | ICD-10-CM | POA: Diagnosis not present

## 2020-06-07 DIAGNOSIS — Z89511 Acquired absence of right leg below knee: Secondary | ICD-10-CM | POA: Diagnosis not present

## 2020-06-07 DIAGNOSIS — D631 Anemia in chronic kidney disease: Secondary | ICD-10-CM | POA: Diagnosis not present

## 2020-06-07 DIAGNOSIS — I4821 Permanent atrial fibrillation: Secondary | ICD-10-CM | POA: Diagnosis not present

## 2020-06-07 DIAGNOSIS — I255 Ischemic cardiomyopathy: Secondary | ICD-10-CM | POA: Diagnosis not present

## 2020-06-07 DIAGNOSIS — E785 Hyperlipidemia, unspecified: Secondary | ICD-10-CM | POA: Diagnosis not present

## 2020-06-07 DIAGNOSIS — I739 Peripheral vascular disease, unspecified: Secondary | ICD-10-CM

## 2020-06-07 DIAGNOSIS — M1990 Unspecified osteoarthritis, unspecified site: Secondary | ICD-10-CM | POA: Diagnosis not present

## 2020-06-07 DIAGNOSIS — Z4781 Encounter for orthopedic aftercare following surgical amputation: Secondary | ICD-10-CM | POA: Diagnosis not present

## 2020-06-07 DIAGNOSIS — R000 Tachycardia, unspecified: Secondary | ICD-10-CM | POA: Diagnosis not present

## 2020-06-07 DIAGNOSIS — R011 Cardiac murmur, unspecified: Secondary | ICD-10-CM | POA: Diagnosis not present

## 2020-06-07 DIAGNOSIS — N186 End stage renal disease: Secondary | ICD-10-CM | POA: Diagnosis not present

## 2020-06-07 DIAGNOSIS — Z992 Dependence on renal dialysis: Secondary | ICD-10-CM | POA: Diagnosis not present

## 2020-06-07 DIAGNOSIS — G4733 Obstructive sleep apnea (adult) (pediatric): Secondary | ICD-10-CM | POA: Diagnosis not present

## 2020-06-07 DIAGNOSIS — J449 Chronic obstructive pulmonary disease, unspecified: Secondary | ICD-10-CM | POA: Diagnosis not present

## 2020-06-07 DIAGNOSIS — I132 Hypertensive heart and chronic kidney disease with heart failure and with stage 5 chronic kidney disease, or end stage renal disease: Secondary | ICD-10-CM | POA: Diagnosis not present

## 2020-06-07 DIAGNOSIS — E291 Testicular hypofunction: Secondary | ICD-10-CM | POA: Diagnosis not present

## 2020-06-07 DIAGNOSIS — Z905 Acquired absence of kidney: Secondary | ICD-10-CM | POA: Diagnosis not present

## 2020-06-07 DIAGNOSIS — N2581 Secondary hyperparathyroidism of renal origin: Secondary | ICD-10-CM | POA: Diagnosis not present

## 2020-06-07 DIAGNOSIS — G8918 Other acute postprocedural pain: Secondary | ICD-10-CM | POA: Diagnosis not present

## 2020-06-07 DIAGNOSIS — I2510 Atherosclerotic heart disease of native coronary artery without angina pectoris: Secondary | ICD-10-CM | POA: Diagnosis not present

## 2020-06-07 DIAGNOSIS — K219 Gastro-esophageal reflux disease without esophagitis: Secondary | ICD-10-CM | POA: Diagnosis not present

## 2020-06-07 DIAGNOSIS — M069 Rheumatoid arthritis, unspecified: Secondary | ICD-10-CM | POA: Diagnosis not present

## 2020-06-07 DIAGNOSIS — I5020 Unspecified systolic (congestive) heart failure: Secondary | ICD-10-CM | POA: Diagnosis not present

## 2020-06-07 DIAGNOSIS — Z85528 Personal history of other malignant neoplasm of kidney: Secondary | ICD-10-CM | POA: Diagnosis not present

## 2020-06-07 LAB — LAB REPORT - SCANNED: INR: 2.8

## 2020-06-07 LAB — POCT INR: INR: 2.8 (ref 2.0–3.0)

## 2020-06-07 NOTE — Progress Notes (Signed)
    Postoperative Visit    History of Present Illness   Jeremy [name] is a 65 y.o. male who presents for postoperative follow-up for: right below-the-knee ampuation (Date: 04/14/20) by Dr. Carlis Abbott.  Prior to amputation he had undergone endovascular revascularization in [place] however due to extensive tissue loss required below the knee amputation.  He also underwent repair of left groin pseudoaneurysm with subsequent Viabahn stenting of SFA, profunda, and common femoral artery by Dr. Lazaro Arms in [place].  He denies any tissue loss or rest pain of left foot.  Patient's wife is present who states she is cleansing the right below the knee amputation twice daily.  She denies any drainage.  There is still a large eschar anteriorly.  He denies any fevers, chills, nausea/vomiting.   For VQI Use Only   PRE-ADM LIVING: Home  AMB STATUS: Wheelchair   Physical Examination   Vitals:   06/07/20 0939  BP: 122/71  Pulse: (!) 124  Resp: 20  Temp: 98.7 F (37.1 C)  SpO2: 99%    RLE: Large dry eschar anteriorly and along the incision line; no drainage       Medical Decision Making   DAISUKE [name] is a 65 y.o. male who presents s/p right below-the-knee amputation.   LLE well perfused; prior endovascular intervention is widely patent based on arterial duplex  All staples removed from right BKA and wound was painted with Betadine  I discussed with the patient and his wife present that this is a nonhealing surgical wound and recommendation would be for revision by above-the-knee amputation.  Patient is still adamantly against an above-the-knee amputation and would like to continue current wound care.  He will follow up with PA in 2 weeks on a day that Dr. Carlis Abbott is in the office  Dagoberto Ligas PA-C [facility] [place] Office: [phone]  Clinic MD: One[facility] Alar

## 2020-06-08 ENCOUNTER — Encounter: Payer: Self-pay | Admitting: Cardiovascular Disease

## 2020-06-08 ENCOUNTER — Ambulatory Visit (INDEPENDENT_AMBULATORY_CARE_PROVIDER_SITE_OTHER): Payer: Medicare Other | Admitting: Interventional Cardiology

## 2020-06-08 DIAGNOSIS — Z952 Presence of prosthetic heart valve: Secondary | ICD-10-CM

## 2020-06-08 DIAGNOSIS — I4891 Unspecified atrial fibrillation: Secondary | ICD-10-CM

## 2020-06-08 DIAGNOSIS — N2581 Secondary hyperparathyroidism of renal origin: Secondary | ICD-10-CM | POA: Diagnosis not present

## 2020-06-08 DIAGNOSIS — Z5181 Encounter for therapeutic drug level monitoring: Secondary | ICD-10-CM | POA: Diagnosis not present

## 2020-06-08 DIAGNOSIS — D509 Iron deficiency anemia, unspecified: Secondary | ICD-10-CM | POA: Diagnosis not present

## 2020-06-08 DIAGNOSIS — D631 Anemia in chronic kidney disease: Secondary | ICD-10-CM | POA: Diagnosis not present

## 2020-06-08 DIAGNOSIS — N186 End stage renal disease: Secondary | ICD-10-CM | POA: Diagnosis not present

## 2020-06-08 DIAGNOSIS — Z992 Dependence on renal dialysis: Secondary | ICD-10-CM | POA: Diagnosis not present

## 2020-06-08 NOTE — Progress Notes (Signed)
Patient called and discussed INR results. Please refer to Anticoagulation Encounter for details.

## 2020-06-10 ENCOUNTER — Encounter: Payer: Self-pay | Admitting: Family Medicine

## 2020-06-11 ENCOUNTER — Telehealth: Payer: Self-pay

## 2020-06-11 DIAGNOSIS — D509 Iron deficiency anemia, unspecified: Secondary | ICD-10-CM | POA: Diagnosis not present

## 2020-06-11 DIAGNOSIS — D631 Anemia in chronic kidney disease: Secondary | ICD-10-CM | POA: Diagnosis not present

## 2020-06-11 DIAGNOSIS — N186 End stage renal disease: Secondary | ICD-10-CM | POA: Diagnosis not present

## 2020-06-11 DIAGNOSIS — Z992 Dependence on renal dialysis: Secondary | ICD-10-CM | POA: Diagnosis not present

## 2020-06-11 DIAGNOSIS — N2581 Secondary hyperparathyroidism of renal origin: Secondary | ICD-10-CM | POA: Diagnosis not present

## 2020-06-11 MED ORDER — WARFARIN SODIUM 1 MG PO TABS: ORAL_TABLET | ORAL | 0 refills | Status: DC

## 2020-06-11 NOTE — Telephone Encounter (Signed)
Patient calling and states he needs his warfarin prescription corrected Takes warfarin 1 MG 1 tablet daily  Please send to [facility] in [name] Patient only received 13 pills

## 2020-06-12 ENCOUNTER — Telehealth: Payer: Self-pay

## 2020-06-12 DIAGNOSIS — I2510 Atherosclerotic heart disease of native coronary artery without angina pectoris: Secondary | ICD-10-CM | POA: Diagnosis not present

## 2020-06-12 DIAGNOSIS — I5020 Unspecified systolic (congestive) heart failure: Secondary | ICD-10-CM | POA: Diagnosis not present

## 2020-06-12 DIAGNOSIS — I132 Hypertensive heart and chronic kidney disease with heart failure and with stage 5 chronic kidney disease, or end stage renal disease: Secondary | ICD-10-CM | POA: Diagnosis not present

## 2020-06-12 DIAGNOSIS — N186 End stage renal disease: Secondary | ICD-10-CM | POA: Diagnosis not present

## 2020-06-12 DIAGNOSIS — Z4781 Encounter for orthopedic aftercare following surgical amputation: Secondary | ICD-10-CM | POA: Diagnosis not present

## 2020-06-12 DIAGNOSIS — D631 Anemia in chronic kidney disease: Secondary | ICD-10-CM | POA: Diagnosis not present

## 2020-06-12 NOTE — Telephone Encounter (Signed)
Patient left message about weaning down from oxycodone. He stopped it for 4-5 days and is having extreme difficulty sleeping and some sweating. He is currently taking a half-pill a day to help wean down - he is not experiencing any pain that tylenol will not cover. Patient has about 24 pills remaining. He  is going to try taking a whole pill at night about an hour before bed for 4-5 days and then cut down to a half pill and see if this helps.

## 2020-06-13 ENCOUNTER — Encounter: Payer: Self-pay | Admitting: Cardiovascular Disease

## 2020-06-13 ENCOUNTER — Ambulatory Visit (INDEPENDENT_AMBULATORY_CARE_PROVIDER_SITE_OTHER): Payer: Medicare Other

## 2020-06-13 DIAGNOSIS — N2581 Secondary hyperparathyroidism of renal origin: Secondary | ICD-10-CM | POA: Diagnosis not present

## 2020-06-13 DIAGNOSIS — N186 End stage renal disease: Secondary | ICD-10-CM | POA: Diagnosis not present

## 2020-06-13 DIAGNOSIS — D631 Anemia in chronic kidney disease: Secondary | ICD-10-CM | POA: Diagnosis not present

## 2020-06-13 DIAGNOSIS — I4891 Unspecified atrial fibrillation: Secondary | ICD-10-CM | POA: Diagnosis not present

## 2020-06-13 DIAGNOSIS — D509 Iron deficiency anemia, unspecified: Secondary | ICD-10-CM | POA: Diagnosis not present

## 2020-06-13 DIAGNOSIS — Z952 Presence of prosthetic heart valve: Secondary | ICD-10-CM

## 2020-06-13 DIAGNOSIS — Z5181 Encounter for therapeutic drug level monitoring: Secondary | ICD-10-CM | POA: Diagnosis not present

## 2020-06-13 DIAGNOSIS — Z992 Dependence on renal dialysis: Secondary | ICD-10-CM | POA: Diagnosis not present

## 2020-06-13 LAB — POCT INR: INR: 4 — AB (ref 2.0–3.0)

## 2020-06-13 NOTE — Patient Instructions (Signed)
Sent MyChart message to pt advising him to: - skip warfarin tonight, then  - continue taking warfarin 1 mg everyday.  - Recheck INR in 1 week.

## 2020-06-14 DIAGNOSIS — I2510 Atherosclerotic heart disease of native coronary artery without angina pectoris: Secondary | ICD-10-CM | POA: Diagnosis not present

## 2020-06-14 DIAGNOSIS — I132 Hypertensive heart and chronic kidney disease with heart failure and with stage 5 chronic kidney disease, or end stage renal disease: Secondary | ICD-10-CM | POA: Diagnosis not present

## 2020-06-14 DIAGNOSIS — I5020 Unspecified systolic (congestive) heart failure: Secondary | ICD-10-CM | POA: Diagnosis not present

## 2020-06-14 DIAGNOSIS — N186 End stage renal disease: Secondary | ICD-10-CM | POA: Diagnosis not present

## 2020-06-14 DIAGNOSIS — D631 Anemia in chronic kidney disease: Secondary | ICD-10-CM | POA: Diagnosis not present

## 2020-06-14 DIAGNOSIS — Z4781 Encounter for orthopedic aftercare following surgical amputation: Secondary | ICD-10-CM | POA: Diagnosis not present

## 2020-06-15 DIAGNOSIS — Z992 Dependence on renal dialysis: Secondary | ICD-10-CM | POA: Diagnosis not present

## 2020-06-15 DIAGNOSIS — D631 Anemia in chronic kidney disease: Secondary | ICD-10-CM | POA: Diagnosis not present

## 2020-06-15 DIAGNOSIS — D509 Iron deficiency anemia, unspecified: Secondary | ICD-10-CM | POA: Diagnosis not present

## 2020-06-15 DIAGNOSIS — N186 End stage renal disease: Secondary | ICD-10-CM | POA: Diagnosis not present

## 2020-06-15 DIAGNOSIS — N2581 Secondary hyperparathyroidism of renal origin: Secondary | ICD-10-CM | POA: Diagnosis not present

## 2020-06-18 DIAGNOSIS — Z992 Dependence on renal dialysis: Secondary | ICD-10-CM | POA: Diagnosis not present

## 2020-06-18 DIAGNOSIS — D631 Anemia in chronic kidney disease: Secondary | ICD-10-CM | POA: Diagnosis not present

## 2020-06-18 DIAGNOSIS — N186 End stage renal disease: Secondary | ICD-10-CM | POA: Diagnosis not present

## 2020-06-18 DIAGNOSIS — D509 Iron deficiency anemia, unspecified: Secondary | ICD-10-CM | POA: Diagnosis not present

## 2020-06-18 DIAGNOSIS — N2581 Secondary hyperparathyroidism of renal origin: Secondary | ICD-10-CM | POA: Diagnosis not present

## 2020-06-19 ENCOUNTER — Other Ambulatory Visit: Payer: Self-pay

## 2020-06-19 ENCOUNTER — Ambulatory Visit (INDEPENDENT_AMBULATORY_CARE_PROVIDER_SITE_OTHER): Payer: Medicare Other | Admitting: Physician Assistant

## 2020-06-19 ENCOUNTER — Encounter: Payer: Self-pay | Admitting: Physician Assistant

## 2020-06-19 VITALS — BP 124/76 | HR 110 | Temp 98.5°F | Resp 20 | Ht 70.0 in

## 2020-06-19 DIAGNOSIS — I739 Peripheral vascular disease, unspecified: Secondary | ICD-10-CM

## 2020-06-19 DIAGNOSIS — S88111A Complete traumatic amputation at level between knee and ankle, right lower leg, initial encounter: Secondary | ICD-10-CM

## 2020-06-19 DIAGNOSIS — I2510 Atherosclerotic heart disease of native coronary artery without angina pectoris: Secondary | ICD-10-CM | POA: Diagnosis not present

## 2020-06-19 DIAGNOSIS — N186 End stage renal disease: Secondary | ICD-10-CM | POA: Diagnosis not present

## 2020-06-19 DIAGNOSIS — I132 Hypertensive heart and chronic kidney disease with heart failure and with stage 5 chronic kidney disease, or end stage renal disease: Secondary | ICD-10-CM | POA: Diagnosis not present

## 2020-06-19 DIAGNOSIS — I5020 Unspecified systolic (congestive) heart failure: Secondary | ICD-10-CM | POA: Diagnosis not present

## 2020-06-19 DIAGNOSIS — Z4781 Encounter for orthopedic aftercare following surgical amputation: Secondary | ICD-10-CM | POA: Diagnosis not present

## 2020-06-19 DIAGNOSIS — D631 Anemia in chronic kidney disease: Secondary | ICD-10-CM | POA: Diagnosis not present

## 2020-06-19 LAB — LAB REPORT - SCANNED: INR: 3.2

## 2020-06-19 NOTE — Progress Notes (Signed)
POST OPERATIVE OFFICE NOTE    CC:  F/u for surgery  HPI:  This is a 65 y.o. male who  presents for postoperative follow-up for: right below-the-knee ampuation (Date: 04/14/20) by Dr. Carlis Abbott.  Prior to amputation he had undergone endovascular revascularization in [place] however due to extensive tissue loss required below the knee amputation.  He also underwent repair of left groin pseudoaneurysm with subsequent Viabahn stenting of SFA, profunda, and common femoral artery by Dr. Lazaro Arms in [place].  Pt returns today for follow up.  Pt stated last visit he was not ready to give up on the healing of the incision.  He has been offered revision to AKA.  Allergies  Allergen Reactions  . Statins Other (See Comments)    Arthralgias (intolerance), Myalgias (intolerance)  Pt taking pravastatin   . Gabapentin     Drowsy   . Sertraline Rash    Current Outpatient Medications  Medication Sig Dispense Refill  . acetaminophen (TYLENOL) 500 MG tablet Take 1 tablet (500 mg total) by mouth every 4 (four) hours as needed. 30 tablet 0  . allopurinol (ZYLOPRIM) 100 MG tablet Take 1 tablet (100 mg total) by mouth daily. 30 tablet 0  . CHOLECALCIFEROL PO Take by mouth.    . clopidogrel (PLAVIX) 75 MG tablet Take 1 tablet (75 mg total) by mouth daily with breakfast. 90 tablet 3  . Epoetin Alfa-epbx (RETACRIT IJ) Epoetin alfa - epbx (Retacrit)    . ezetimibe (ZETIA) 10 MG tablet TAKE 1 TABLET BY MOUTH EVERY DAY (Patient taking differently: Take 10 mg by mouth daily.) 90 tablet 1  . hydroxychloroquine (PLAQUENIL) 200 MG tablet Take 400 mg by mouth at bedtime.    . lamoTRIgine (LAMICTAL) 25 MG tablet Take 2 tablets (50 mg total) by mouth 2 (two) times daily. 120 tablet 0  . lidocaine-prilocaine (EMLA) cream Apply 1 application topically daily as needed (for fistula).    . loperamide (IMODIUM A-D) 2 MG tablet Take 2 mg by mouth 4 (four) times daily as needed for diarrhea or loose stools.    . megestrol (MEGACE) 40  MG/ML suspension TAKE 10 ML (400 MG TOTAL) BY MOUTH DAILY.    . metoprolol tartrate 75 MG TABS Take 75 mg by mouth 2 (two) times daily. 180 tablet 3  . multivitamin (RENA-VIT) TABS tablet Take 1 tablet by mouth daily.    . nitroGLYCERIN (NITROSTAT) 0.4 MG SL tablet Place 1 tablet (0.4 mg total) under the tongue every 5 (five) minutes as needed for chest pain. 25 tablet 3  . oxyCODONE-acetaminophen (PERCOCET/ROXICET) 5-325 MG tablet     . pantoprazole (PROTONIX) 40 MG tablet Take 1 tablet (40 mg total) by mouth daily. 90 tablet 3  . polyethylene glycol (MIRALAX / GLYCOLAX) 17 g packet Take 17 g by mouth daily as needed for mild constipation. 30 each 0  . predniSONE (DELTASONE) 5 MG tablet Take 5 mg by mouth daily.    . rosuvastatin (CRESTOR) 40 MG tablet Take 1 tablet (40 mg total) by mouth daily at 6 PM. 90 tablet 3  . sevelamer carbonate (RENVELA) 800 MG tablet Take 3,200 mg by mouth 3 (three) times daily with meals.    . traZODone (DESYREL) 50 MG tablet TAKE 1/2 -1 TABLET (25-50 MG TOTAL) BY MOUTH AT BEDTIME AS NEEDED FOR SLEEP. (Patient taking differently: Take 25-50 mg by mouth at bedtime.) 90 tablet 2  . warfarin (COUMADIN) 1 MG tablet Take 1 to 2 tablets daily as directed by the coumadin  clinic 90 tablet 0   No current facility-administered medications for this visit.     ROS:  See HPI  Physical Exam:        Posterior flap is warm and appears viable.   Assessment/Plan:  This is a 65 y.o. 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 Cont. betadine over the black eschar with dry wrap daily.  He may shower as needed and get the right stump wet with dial soap.  No fever, chills or purulent drainage.    F/U in 2 weeks for wound check.  Dr. Carlis Abbott examined the wound and agrees with this plan.   Roxy Horseman PA-C [facility] [phone]   Clinic MD:  Carlis Abbott

## 2020-06-20 ENCOUNTER — Ambulatory Visit (INDEPENDENT_AMBULATORY_CARE_PROVIDER_SITE_OTHER): Payer: Medicare Other

## 2020-06-20 ENCOUNTER — Encounter: Payer: Self-pay | Admitting: Cardiovascular Disease

## 2020-06-20 DIAGNOSIS — I4891 Unspecified atrial fibrillation: Secondary | ICD-10-CM

## 2020-06-20 DIAGNOSIS — Z5181 Encounter for therapeutic drug level monitoring: Secondary | ICD-10-CM | POA: Diagnosis not present

## 2020-06-20 DIAGNOSIS — N2581 Secondary hyperparathyroidism of renal origin: Secondary | ICD-10-CM | POA: Diagnosis not present

## 2020-06-20 DIAGNOSIS — D631 Anemia in chronic kidney disease: Secondary | ICD-10-CM | POA: Diagnosis not present

## 2020-06-20 DIAGNOSIS — N186 End stage renal disease: Secondary | ICD-10-CM | POA: Diagnosis not present

## 2020-06-20 DIAGNOSIS — Z992 Dependence on renal dialysis: Secondary | ICD-10-CM | POA: Diagnosis not present

## 2020-06-20 DIAGNOSIS — Z952 Presence of prosthetic heart valve: Secondary | ICD-10-CM

## 2020-06-20 DIAGNOSIS — D509 Iron deficiency anemia, unspecified: Secondary | ICD-10-CM | POA: Diagnosis not present

## 2020-06-20 LAB — POCT INR: INR: 3.2 — AB (ref 2.0–3.0)

## 2020-06-20 NOTE — Patient Instructions (Signed)
Sent MyChart message to pt advising him to:  - continue taking warfarin 1 mg everyday.  - Recheck INR in 1 week.

## 2020-06-21 DIAGNOSIS — Z4781 Encounter for orthopedic aftercare following surgical amputation: Secondary | ICD-10-CM | POA: Diagnosis not present

## 2020-06-21 DIAGNOSIS — D631 Anemia in chronic kidney disease: Secondary | ICD-10-CM | POA: Diagnosis not present

## 2020-06-21 DIAGNOSIS — I5020 Unspecified systolic (congestive) heart failure: Secondary | ICD-10-CM | POA: Diagnosis not present

## 2020-06-21 DIAGNOSIS — N186 End stage renal disease: Secondary | ICD-10-CM | POA: Diagnosis not present

## 2020-06-21 DIAGNOSIS — I2510 Atherosclerotic heart disease of native coronary artery without angina pectoris: Secondary | ICD-10-CM | POA: Diagnosis not present

## 2020-06-21 DIAGNOSIS — I132 Hypertensive heart and chronic kidney disease with heart failure and with stage 5 chronic kidney disease, or end stage renal disease: Secondary | ICD-10-CM | POA: Diagnosis not present

## 2020-06-22 DIAGNOSIS — D509 Iron deficiency anemia, unspecified: Secondary | ICD-10-CM | POA: Diagnosis not present

## 2020-06-22 DIAGNOSIS — Z992 Dependence on renal dialysis: Secondary | ICD-10-CM | POA: Diagnosis not present

## 2020-06-22 DIAGNOSIS — D631 Anemia in chronic kidney disease: Secondary | ICD-10-CM | POA: Diagnosis not present

## 2020-06-22 DIAGNOSIS — N2581 Secondary hyperparathyroidism of renal origin: Secondary | ICD-10-CM | POA: Diagnosis not present

## 2020-06-22 DIAGNOSIS — N186 End stage renal disease: Secondary | ICD-10-CM | POA: Diagnosis not present

## 2020-06-25 DIAGNOSIS — N2581 Secondary hyperparathyroidism of renal origin: Secondary | ICD-10-CM | POA: Diagnosis not present

## 2020-06-25 DIAGNOSIS — Z992 Dependence on renal dialysis: Secondary | ICD-10-CM | POA: Diagnosis not present

## 2020-06-25 DIAGNOSIS — D509 Iron deficiency anemia, unspecified: Secondary | ICD-10-CM | POA: Diagnosis not present

## 2020-06-25 DIAGNOSIS — N186 End stage renal disease: Secondary | ICD-10-CM | POA: Diagnosis not present

## 2020-06-25 DIAGNOSIS — D631 Anemia in chronic kidney disease: Secondary | ICD-10-CM | POA: Diagnosis not present

## 2020-06-26 ENCOUNTER — Telehealth: Payer: Self-pay

## 2020-06-26 DIAGNOSIS — I5020 Unspecified systolic (congestive) heart failure: Secondary | ICD-10-CM | POA: Diagnosis not present

## 2020-06-26 DIAGNOSIS — I2510 Atherosclerotic heart disease of native coronary artery without angina pectoris: Secondary | ICD-10-CM | POA: Diagnosis not present

## 2020-06-26 DIAGNOSIS — Z4781 Encounter for orthopedic aftercare following surgical amputation: Secondary | ICD-10-CM | POA: Diagnosis not present

## 2020-06-26 DIAGNOSIS — I132 Hypertensive heart and chronic kidney disease with heart failure and with stage 5 chronic kidney disease, or end stage renal disease: Secondary | ICD-10-CM | POA: Diagnosis not present

## 2020-06-26 DIAGNOSIS — N186 End stage renal disease: Secondary | ICD-10-CM | POA: Diagnosis not present

## 2020-06-26 DIAGNOSIS — D631 Anemia in chronic kidney disease: Secondary | ICD-10-CM | POA: Diagnosis not present

## 2020-06-26 LAB — POCT INR: INR: 4.2 — AB (ref 2.0–3.0)

## 2020-06-26 NOTE — Telephone Encounter (Signed)
Patient lost his balance on Sunday and fell directly on his stump. He now has a "gash" where the scab broke open. Looked at pictures from last weeks visit and discussed with PA - added patient on for wound check tomorrow after his HD.

## 2020-06-27 ENCOUNTER — Ambulatory Visit (INDEPENDENT_AMBULATORY_CARE_PROVIDER_SITE_OTHER): Payer: Medicare Other | Admitting: Physician Assistant

## 2020-06-27 ENCOUNTER — Encounter: Payer: Self-pay | Admitting: Physician Assistant

## 2020-06-27 ENCOUNTER — Ambulatory Visit (INDEPENDENT_AMBULATORY_CARE_PROVIDER_SITE_OTHER): Payer: Medicare Other

## 2020-06-27 ENCOUNTER — Other Ambulatory Visit: Payer: Self-pay

## 2020-06-27 ENCOUNTER — Encounter: Payer: Self-pay | Admitting: Cardiovascular Disease

## 2020-06-27 VITALS — BP 98/55 | HR 106 | Temp 98.4°F | Resp 20 | Ht 70.0 in

## 2020-06-27 DIAGNOSIS — D509 Iron deficiency anemia, unspecified: Secondary | ICD-10-CM | POA: Diagnosis not present

## 2020-06-27 DIAGNOSIS — Z5181 Encounter for therapeutic drug level monitoring: Secondary | ICD-10-CM

## 2020-06-27 DIAGNOSIS — D631 Anemia in chronic kidney disease: Secondary | ICD-10-CM | POA: Diagnosis not present

## 2020-06-27 DIAGNOSIS — Z992 Dependence on renal dialysis: Secondary | ICD-10-CM | POA: Diagnosis not present

## 2020-06-27 DIAGNOSIS — Z952 Presence of prosthetic heart valve: Secondary | ICD-10-CM | POA: Diagnosis not present

## 2020-06-27 DIAGNOSIS — I4891 Unspecified atrial fibrillation: Secondary | ICD-10-CM

## 2020-06-27 DIAGNOSIS — N2581 Secondary hyperparathyroidism of renal origin: Secondary | ICD-10-CM | POA: Diagnosis not present

## 2020-06-27 DIAGNOSIS — N186 End stage renal disease: Secondary | ICD-10-CM | POA: Diagnosis not present

## 2020-06-27 DIAGNOSIS — S88111A Complete traumatic amputation at level between knee and ankle, right lower leg, initial encounter: Secondary | ICD-10-CM

## 2020-06-27 NOTE — Telephone Encounter (Signed)
This encounter was created in error - please disregard.

## 2020-06-27 NOTE — Patient Instructions (Signed)
Sent MyChart message to pt advising him to:  - skip warfarin tonight, then  - continue taking warfarin 1 mg everyday.  - Recheck INR in 1 week.

## 2020-06-27 NOTE — Progress Notes (Signed)
    Postoperative Visit    History of Present Illness   Jeremy [name] is a 65 y.o. male who presents for postoperative follow-up for: right below-the-knee amputation (Date: 04/14/20) by Dr. Carlis Abbott.  Prior to amputation he had undergone endovascular revascularization in [place] however due to extensive tissue loss required below the knee amputation.    Below the knee amputation has been slow to heal.  3 days ago patient states he fell on his stump and is here for evaluation.   For VQI Use Only   PRE-ADM LIVING: Home  AMB STATUS: Wheelchair   Physical Examination   Vitals:   06/27/20 1424  BP: (!) 98/55  Pulse: (!) 106  Resp: 20  Temp: 98.4 F (36.9 C)  SpO2: 99%    RLE:         Medical Decision Making   Jeremy [name] is a 65 y.o. male who presents s/p right below-the-knee amputation.   R BKA stump with no significant change on physical exam after the fall  Continue daily cleansing with soap and water and patting dry  Continue painting eschar with betadine  Follow up in 2 weeks for incision check  Jeremy Ligas PA-C [facility] Office: [phone]  Clinic MD: Scot Dock

## 2020-06-27 NOTE — Telephone Encounter (Signed)
Noted. He appears to be at his surgeons office now.

## 2020-06-28 DIAGNOSIS — I132 Hypertensive heart and chronic kidney disease with heart failure and with stage 5 chronic kidney disease, or end stage renal disease: Secondary | ICD-10-CM | POA: Diagnosis not present

## 2020-06-28 DIAGNOSIS — D631 Anemia in chronic kidney disease: Secondary | ICD-10-CM | POA: Diagnosis not present

## 2020-06-28 DIAGNOSIS — I2510 Atherosclerotic heart disease of native coronary artery without angina pectoris: Secondary | ICD-10-CM | POA: Diagnosis not present

## 2020-06-28 DIAGNOSIS — N186 End stage renal disease: Secondary | ICD-10-CM | POA: Diagnosis not present

## 2020-06-28 DIAGNOSIS — I5020 Unspecified systolic (congestive) heart failure: Secondary | ICD-10-CM | POA: Diagnosis not present

## 2020-06-28 DIAGNOSIS — Z4781 Encounter for orthopedic aftercare following surgical amputation: Secondary | ICD-10-CM | POA: Diagnosis not present

## 2020-06-29 DIAGNOSIS — D509 Iron deficiency anemia, unspecified: Secondary | ICD-10-CM | POA: Diagnosis not present

## 2020-06-29 DIAGNOSIS — N2581 Secondary hyperparathyroidism of renal origin: Secondary | ICD-10-CM | POA: Diagnosis not present

## 2020-06-29 DIAGNOSIS — N186 End stage renal disease: Secondary | ICD-10-CM | POA: Diagnosis not present

## 2020-06-29 DIAGNOSIS — Z992 Dependence on renal dialysis: Secondary | ICD-10-CM | POA: Diagnosis not present

## 2020-06-29 DIAGNOSIS — D631 Anemia in chronic kidney disease: Secondary | ICD-10-CM | POA: Diagnosis not present

## 2020-07-02 ENCOUNTER — Telehealth: Payer: Self-pay | Admitting: Family Medicine

## 2020-07-02 ENCOUNTER — Emergency Department: Payer: Medicare Other | Admitting: Anesthesiology

## 2020-07-02 ENCOUNTER — Emergency Department: Payer: Medicare Other

## 2020-07-02 ENCOUNTER — Other Ambulatory Visit: Payer: Self-pay

## 2020-07-02 ENCOUNTER — Encounter: Payer: Self-pay | Admitting: Emergency Medicine

## 2020-07-02 ENCOUNTER — Encounter
Admission: EM | Disposition: A | Discharge status: Home Health | Payer: Self-pay | Source: Home / Self Care | Attending: Internal Medicine

## 2020-07-02 ENCOUNTER — Inpatient Hospital Stay
Admission: EM | Admit: 2020-07-02 | Discharge: 2020-07-08 | DRG: 853 | Disposition: A | Discharge status: Home Health | Payer: Medicare Other | Attending: Internal Medicine | Admitting: Internal Medicine

## 2020-07-02 DIAGNOSIS — I252 Old myocardial infarction: Secondary | ICD-10-CM

## 2020-07-02 DIAGNOSIS — M659 Synovitis and tenosynovitis, unspecified: Secondary | ICD-10-CM | POA: Diagnosis present

## 2020-07-02 DIAGNOSIS — Z20822 Contact with and (suspected) exposure to covid-19: Secondary | ICD-10-CM | POA: Diagnosis present

## 2020-07-02 DIAGNOSIS — T8789 Other complications of amputation stump: Secondary | ICD-10-CM | POA: Diagnosis present

## 2020-07-02 DIAGNOSIS — Z992 Dependence on renal dialysis: Secondary | ICD-10-CM | POA: Diagnosis not present

## 2020-07-02 DIAGNOSIS — I480 Paroxysmal atrial fibrillation: Secondary | ICD-10-CM | POA: Diagnosis not present

## 2020-07-02 DIAGNOSIS — E875 Hyperkalemia: Secondary | ICD-10-CM | POA: Diagnosis present

## 2020-07-02 DIAGNOSIS — Z8249 Family history of ischemic heart disease and other diseases of the circulatory system: Secondary | ICD-10-CM

## 2020-07-02 DIAGNOSIS — I70229 Atherosclerosis of native arteries of extremities with rest pain, unspecified extremity: Secondary | ICD-10-CM | POA: Diagnosis present

## 2020-07-02 DIAGNOSIS — E785 Hyperlipidemia, unspecified: Secondary | ICD-10-CM | POA: Diagnosis present

## 2020-07-02 DIAGNOSIS — N189 Chronic kidney disease, unspecified: Secondary | ICD-10-CM

## 2020-07-02 DIAGNOSIS — Z905 Acquired absence of kidney: Secondary | ICD-10-CM

## 2020-07-02 DIAGNOSIS — Z8616 Personal history of COVID-19: Secondary | ICD-10-CM

## 2020-07-02 DIAGNOSIS — N2581 Secondary hyperparathyroidism of renal origin: Secondary | ICD-10-CM | POA: Diagnosis present

## 2020-07-02 DIAGNOSIS — Z79899 Other long term (current) drug therapy: Secondary | ICD-10-CM

## 2020-07-02 DIAGNOSIS — M25561 Pain in right knee: Secondary | ICD-10-CM | POA: Diagnosis not present

## 2020-07-02 DIAGNOSIS — A419 Sepsis, unspecified organism: Secondary | ICD-10-CM | POA: Diagnosis present

## 2020-07-02 DIAGNOSIS — Z7902 Long term (current) use of antithrombotics/antiplatelets: Secondary | ICD-10-CM

## 2020-07-02 DIAGNOSIS — R531 Weakness: Secondary | ICD-10-CM | POA: Diagnosis not present

## 2020-07-02 DIAGNOSIS — Y830 Surgical operation with transplant of whole organ as the cause of abnormal reaction of the patient, or of later complication, without mention of misadventure at the time of the procedure: Secondary | ICD-10-CM | POA: Diagnosis present

## 2020-07-02 DIAGNOSIS — Z89511 Acquired absence of right leg below knee: Secondary | ICD-10-CM

## 2020-07-02 DIAGNOSIS — S88112A Complete traumatic amputation at level between knee and ankle, left lower leg, initial encounter: Secondary | ICD-10-CM

## 2020-07-02 DIAGNOSIS — M1711 Unilateral primary osteoarthritis, right knee: Secondary | ICD-10-CM | POA: Diagnosis present

## 2020-07-02 DIAGNOSIS — I359 Nonrheumatic aortic valve disorder, unspecified: Secondary | ICD-10-CM | POA: Diagnosis present

## 2020-07-02 DIAGNOSIS — M009 Pyogenic arthritis, unspecified: Secondary | ICD-10-CM | POA: Diagnosis present

## 2020-07-02 DIAGNOSIS — I132 Hypertensive heart and chronic kidney disease with heart failure and with stage 5 chronic kidney disease, or end stage renal disease: Secondary | ICD-10-CM | POA: Diagnosis present

## 2020-07-02 DIAGNOSIS — Z7952 Long term (current) use of systemic steroids: Secondary | ICD-10-CM

## 2020-07-02 DIAGNOSIS — Z952 Presence of prosthetic heart valve: Secondary | ICD-10-CM

## 2020-07-02 DIAGNOSIS — I509 Heart failure, unspecified: Secondary | ICD-10-CM | POA: Diagnosis not present

## 2020-07-02 DIAGNOSIS — I471 Supraventricular tachycardia: Secondary | ICD-10-CM | POA: Diagnosis present

## 2020-07-02 DIAGNOSIS — I2510 Atherosclerotic heart disease of native coronary artery without angina pectoris: Secondary | ICD-10-CM | POA: Diagnosis present

## 2020-07-02 DIAGNOSIS — D631 Anemia in chronic kidney disease: Secondary | ICD-10-CM | POA: Diagnosis present

## 2020-07-02 DIAGNOSIS — Z888 Allergy status to other drugs, medicaments and biological substances status: Secondary | ICD-10-CM

## 2020-07-02 DIAGNOSIS — G4733 Obstructive sleep apnea (adult) (pediatric): Secondary | ICD-10-CM | POA: Diagnosis present

## 2020-07-02 DIAGNOSIS — S2231XA Fracture of one rib, right side, initial encounter for closed fracture: Secondary | ICD-10-CM | POA: Diagnosis not present

## 2020-07-02 DIAGNOSIS — E1122 Type 2 diabetes mellitus with diabetic chronic kidney disease: Secondary | ICD-10-CM | POA: Diagnosis not present

## 2020-07-02 DIAGNOSIS — M65861 Other synovitis and tenosynovitis, right lower leg: Secondary | ICD-10-CM | POA: Diagnosis not present

## 2020-07-02 DIAGNOSIS — D696 Thrombocytopenia, unspecified: Secondary | ICD-10-CM | POA: Diagnosis present

## 2020-07-02 DIAGNOSIS — T8612 Kidney transplant failure: Secondary | ICD-10-CM | POA: Diagnosis present

## 2020-07-02 DIAGNOSIS — K5900 Constipation, unspecified: Secondary | ICD-10-CM | POA: Diagnosis present

## 2020-07-02 DIAGNOSIS — T8610 Unspecified complication of kidney transplant: Secondary | ICD-10-CM | POA: Diagnosis not present

## 2020-07-02 DIAGNOSIS — D539 Nutritional anemia, unspecified: Secondary | ICD-10-CM | POA: Diagnosis present

## 2020-07-02 DIAGNOSIS — I25118 Atherosclerotic heart disease of native coronary artery with other forms of angina pectoris: Secondary | ICD-10-CM

## 2020-07-02 DIAGNOSIS — M23351 Other meniscus derangements, posterior horn of lateral meniscus, right knee: Secondary | ICD-10-CM | POA: Diagnosis not present

## 2020-07-02 DIAGNOSIS — Z85528 Personal history of other malignant neoplasm of kidney: Secondary | ICD-10-CM

## 2020-07-02 DIAGNOSIS — M00061 Staphylococcal arthritis, right knee: Secondary | ICD-10-CM | POA: Diagnosis not present

## 2020-07-02 DIAGNOSIS — S83281A Other tear of lateral meniscus, current injury, right knee, initial encounter: Secondary | ICD-10-CM | POA: Diagnosis present

## 2020-07-02 DIAGNOSIS — Z955 Presence of coronary angioplasty implant and graft: Secondary | ICD-10-CM | POA: Diagnosis not present

## 2020-07-02 DIAGNOSIS — I10 Essential (primary) hypertension: Secondary | ICD-10-CM | POA: Diagnosis not present

## 2020-07-02 DIAGNOSIS — J849 Interstitial pulmonary disease, unspecified: Secondary | ICD-10-CM | POA: Diagnosis present

## 2020-07-02 DIAGNOSIS — I4891 Unspecified atrial fibrillation: Secondary | ICD-10-CM | POA: Diagnosis present

## 2020-07-02 DIAGNOSIS — I4821 Permanent atrial fibrillation: Secondary | ICD-10-CM | POA: Diagnosis present

## 2020-07-02 DIAGNOSIS — D638 Anemia in other chronic diseases classified elsewhere: Secondary | ICD-10-CM | POA: Diagnosis present

## 2020-07-02 DIAGNOSIS — Y835 Amputation of limb(s) as the cause of abnormal reaction of the patient, or of later complication, without mention of misadventure at the time of the procedure: Secondary | ICD-10-CM | POA: Diagnosis present

## 2020-07-02 DIAGNOSIS — N186 End stage renal disease: Secondary | ICD-10-CM | POA: Diagnosis present

## 2020-07-02 DIAGNOSIS — R509 Fever, unspecified: Secondary | ICD-10-CM | POA: Diagnosis not present

## 2020-07-02 DIAGNOSIS — R6520 Severe sepsis without septic shock: Secondary | ICD-10-CM | POA: Diagnosis present

## 2020-07-02 DIAGNOSIS — Z7901 Long term (current) use of anticoagulants: Secondary | ICD-10-CM

## 2020-07-02 DIAGNOSIS — I998 Other disorder of circulatory system: Secondary | ICD-10-CM | POA: Diagnosis not present

## 2020-07-02 DIAGNOSIS — Z951 Presence of aortocoronary bypass graft: Secondary | ICD-10-CM

## 2020-07-02 DIAGNOSIS — D509 Iron deficiency anemia, unspecified: Secondary | ICD-10-CM | POA: Diagnosis not present

## 2020-07-02 HISTORY — PX: KNEE ARTHROSCOPY WITH MEDIAL MENISECTOMY: SHX5651

## 2020-07-02 LAB — RESP PANEL BY RT-PCR (FLU A&B, COVID) ARPGX2
Influenza A by PCR: NEGATIVE
Influenza B by PCR: NEGATIVE
SARS Coronavirus 2 by RT PCR: NEGATIVE

## 2020-07-02 LAB — LACTIC ACID, PLASMA
Lactic Acid, Venous: 2 mmol/L (ref 0.5–1.9)
Lactic Acid, Venous: 2.6 mmol/L (ref 0.5–1.9)
Lactic Acid, Venous: 1.3 mmol/L (ref 0.5–1.9)

## 2020-07-02 LAB — CBC WITH DIFFERENTIAL/PLATELET
Abs Immature Granulocytes: 0.02 10*3/uL (ref 0.00–0.07)
Basophils Absolute: 0 10*3/uL (ref 0.0–0.1)
Basophils Relative: 0 %
Eosinophils Absolute: 0 10*3/uL (ref 0.0–0.5)
Eosinophils Relative: 0 %
HCT: 31.4 % — ABNORMAL LOW (ref 39.0–52.0)
Hemoglobin: 9.6 g/dL — ABNORMAL LOW (ref 13.0–17.0)
Immature Granulocytes: 0 %
Lymphocytes Relative: 17 %
Lymphs Abs: 1.3 10*3/uL (ref 0.7–4.0)
MCH: 31.9 pg (ref 26.0–34.0)
MCHC: 30.6 g/dL (ref 30.0–36.0)
MCV: 104.3 fL — ABNORMAL HIGH (ref 80.0–100.0)
Monocytes Absolute: 0.7 10*3/uL (ref 0.1–1.0)
Monocytes Relative: 10 %
Neutro Abs: 5.4 10*3/uL (ref 1.7–7.7)
Neutrophils Relative %: 73 %
Platelets: 125 10*3/uL — ABNORMAL LOW (ref 150–400)
RBC: 3.01 MIL/uL — ABNORMAL LOW (ref 4.22–5.81)
RDW: 17.3 % — ABNORMAL HIGH (ref 11.5–15.5)
WBC: 7.5 10*3/uL (ref 4.0–10.5)
nRBC: 0 % (ref 0.0–0.2)

## 2020-07-02 LAB — PROTIME-INR
INR: 3.4 — ABNORMAL HIGH (ref 0.8–1.2)
Prothrombin Time: 33.2 seconds — ABNORMAL HIGH (ref 11.4–15.2)

## 2020-07-02 LAB — COMPREHENSIVE METABOLIC PANEL
ALT: 18 U/L (ref 0–44)
AST: 25 U/L (ref 15–41)
Albumin: 3.5 g/dL (ref 3.5–5.0)
Alkaline Phosphatase: 127 U/L — ABNORMAL HIGH (ref 38–126)
Anion gap: 14 (ref 5–15)
BUN: 24 mg/dL — ABNORMAL HIGH (ref 8–23)
CO2: 30 mmol/L (ref 22–32)
Calcium: 8.7 mg/dL — ABNORMAL LOW (ref 8.9–10.3)
Chloride: 94 mmol/L — ABNORMAL LOW (ref 98–111)
Creatinine, Ser: 4.21 mg/dL — ABNORMAL HIGH (ref 0.61–1.24)
GFR, Estimated: 15 mL/min — ABNORMAL LOW (ref >60)
Glucose, Bld: 95 mg/dL (ref 70–99)
Potassium: 4.1 mmol/L (ref 3.5–5.1)
Sodium: 138 mmol/L (ref 135–145)
Total Bilirubin: 1 mg/dL (ref 0.3–1.2)
Total Protein: 7.6 g/dL (ref 6.5–8.1)

## 2020-07-02 LAB — SYNOVIAL CELL COUNT + DIFF, W/ CRYSTALS
Crystals, Fluid: NONE SEEN
Eosinophils-Synovial: 0 %
Lymphocytes-Synovial Fld: 2 %
Monocyte-Macrophage-Synovial Fluid: 0 %
Neutrophil, Synovial: 98 %
Other Cells-SYN: 0
WBC, Synovial: 65146 /mm3 — ABNORMAL HIGH (ref 0–200)

## 2020-07-02 SURGERY — ARTHROSCOPY, KNEE, WITH MEDIAL MENISCECTOMY
Anesthesia: General | Site: Knee | Laterality: Right

## 2020-07-02 MED ORDER — SODIUM CHLORIDE 0.9 % IV SOLN
2.0000 g | Freq: Once | INTRAVENOUS | Status: AC
  Administered 2020-07-02: 2 g via INTRAVENOUS
  Filled 2020-07-02: qty 20

## 2020-07-02 MED ORDER — BUPIVACAINE HCL (PF) 0.5 % IJ SOLN
INTRAMUSCULAR | Status: DC | PRN
  Administered 2020-07-02: 2 mL

## 2020-07-02 MED ORDER — SODIUM CHLORIDE 0.9 % IV SOLN
INTRAVENOUS | Status: DC | PRN
  Administered 2020-07-02: 30 ug/min via INTRAVENOUS

## 2020-07-02 MED ORDER — BUPIVACAINE HCL (PF) 0.25 % IJ SOLN
10.0000 mL | Freq: Once | INTRAMUSCULAR | Status: AC
  Administered 2020-07-02: 10 mL
  Filled 2020-07-02: qty 10

## 2020-07-02 MED ORDER — SUCCINYLCHOLINE CHLORIDE 20 MG/ML IJ SOLN
INTRAMUSCULAR | Status: DC | PRN
  Administered 2020-07-02: 100 mg via INTRAVENOUS

## 2020-07-02 MED ORDER — OXYCODONE HCL 5 MG/5ML PO SOLN: 5.0000 mg | Freq: Once | ORAL | Status: AC | PRN

## 2020-07-02 MED ORDER — ONDANSETRON HCL 4 MG/2ML IJ SOLN: 4.0000 mg | Freq: Once | INTRAMUSCULAR | Status: DC | PRN

## 2020-07-02 MED ORDER — MIDAZOLAM HCL 2 MG/2ML IJ SOLN
INTRAMUSCULAR | Status: AC
  Filled 2020-07-02: qty 2

## 2020-07-02 MED ORDER — PHENYLEPHRINE HCL (PRESSORS) 10 MG/ML IV SOLN
INTRAVENOUS | Status: DC | PRN
  Administered 2020-07-02: 50 ug via INTRAVENOUS
  Administered 2020-07-02 (×2): 100 ug via INTRAVENOUS

## 2020-07-02 MED ORDER — LIDOCAINE-EPINEPHRINE 1 %-1:100000 IJ SOLN
INTRAMUSCULAR | Status: DC | PRN
  Administered 2020-07-02: 2 mL

## 2020-07-02 MED ORDER — PROPOFOL 10 MG/ML IV BOLUS
INTRAVENOUS | Status: DC | PRN
  Administered 2020-07-02: 110 mg via INTRAVENOUS

## 2020-07-02 MED ORDER — ACETAMINOPHEN 10 MG/ML IV SOLN: 1000.0000 mg | Freq: Once | INTRAVENOUS | Status: DC | PRN

## 2020-07-02 MED ORDER — VANCOMYCIN HCL IN DEXTROSE 1-5 GM/200ML-% IV SOLN
1000.0000 mg | Freq: Once | INTRAVENOUS | Status: AC
  Administered 2020-07-02: 1000 mg via INTRAVENOUS
  Filled 2020-07-02: qty 200

## 2020-07-02 MED ORDER — VANCOMYCIN HCL 750 MG/150ML IV SOLN
750.0000 mg | Freq: Once | INTRAVENOUS | Status: AC
  Administered 2020-07-02: 750 mg via INTRAVENOUS
  Filled 2020-07-02: qty 150

## 2020-07-02 MED ORDER — BUPIVACAINE HCL (PF) 0.5 % IJ SOLN
INTRAMUSCULAR | Status: AC
  Filled 2020-07-02: qty 30

## 2020-07-02 MED ORDER — SODIUM CHLORIDE 0.9 % IV BOLUS
500.0000 mL | Freq: Once | INTRAVENOUS | Status: AC
  Administered 2020-07-02: 500 mL via INTRAVENOUS

## 2020-07-02 MED ORDER — ACETAMINOPHEN 10 MG/ML IV SOLN
INTRAVENOUS | Status: AC
  Filled 2020-07-02: qty 100

## 2020-07-02 MED ORDER — FENTANYL CITRATE (PF) 100 MCG/2ML IJ SOLN
25.0000 ug | INTRAMUSCULAR | Status: DC | PRN
  Administered 2020-07-03 (×2): 50 ug via INTRAVENOUS

## 2020-07-02 MED ORDER — LIDOCAINE HCL (CARDIAC) PF 100 MG/5ML IV SOSY
PREFILLED_SYRINGE | INTRAVENOUS | Status: DC | PRN
  Administered 2020-07-02: 80 mg via INTRAVENOUS

## 2020-07-02 MED ORDER — FENTANYL CITRATE (PF) 100 MCG/2ML IJ SOLN
INTRAMUSCULAR | Status: AC
  Filled 2020-07-02: qty 2

## 2020-07-02 MED ORDER — LACTATED RINGERS IR SOLN
Status: DC | PRN
  Administered 2020-07-02: 20000 mL

## 2020-07-02 MED ORDER — BUPIVACAINE HCL 0.25 % IJ SOLN: 10.0000 mL | Freq: Once | INTRAMUSCULAR | Status: DC

## 2020-07-02 MED ORDER — ONDANSETRON HCL 4 MG/2ML IJ SOLN
INTRAMUSCULAR | Status: DC | PRN
  Administered 2020-07-02: 4 mg via INTRAVENOUS

## 2020-07-02 MED ORDER — HYDROMORPHONE HCL 1 MG/ML IJ SOLN
1.0000 mg | Freq: Once | INTRAMUSCULAR | Status: AC
  Administered 2020-07-02: 1 mg via INTRAVENOUS
  Filled 2020-07-02: qty 1

## 2020-07-02 MED ORDER — DEXMEDETOMIDINE (PRECEDEX) IN NS 20 MCG/5ML (4 MCG/ML) IV SYRINGE
PREFILLED_SYRINGE | INTRAVENOUS | Status: AC
  Filled 2020-07-02: qty 5

## 2020-07-02 MED ORDER — FENTANYL CITRATE (PF) 100 MCG/2ML IJ SOLN
INTRAMUSCULAR | Status: DC | PRN
  Administered 2020-07-02 (×2): 50 ug via INTRAVENOUS

## 2020-07-02 MED ORDER — VANCOMYCIN HCL 750 MG/150ML IV SOLN
750.0000 mg | INTRAVENOUS | Status: DC
  Administered 2020-07-04 – 2020-07-06 (×2): 750 mg via INTRAVENOUS
  Filled 2020-07-02 (×4): qty 150

## 2020-07-02 MED ORDER — LACTATED RINGERS IV BOLUS
1000.0000 mL | Freq: Once | INTRAVENOUS | Status: AC
  Administered 2020-07-02: 1000 mL via INTRAVENOUS

## 2020-07-02 MED ORDER — ONDANSETRON HCL 4 MG/2ML IJ SOLN
INTRAMUSCULAR | Status: AC
  Filled 2020-07-02: qty 2

## 2020-07-02 MED ORDER — SODIUM CHLORIDE 0.9 % IV BOLUS: 500.0000 mL | Freq: Once | INTRAVENOUS | Status: DC

## 2020-07-02 MED ORDER — DEXMEDETOMIDINE (PRECEDEX) IN NS 20 MCG/5ML (4 MCG/ML) IV SYRINGE
PREFILLED_SYRINGE | INTRAVENOUS | Status: DC | PRN
  Administered 2020-07-02 (×2): 4 ug via INTRAVENOUS

## 2020-07-02 MED ORDER — ACETAMINOPHEN 10 MG/ML IV SOLN
INTRAVENOUS | Status: DC | PRN
  Administered 2020-07-02: 1000 mg via INTRAVENOUS

## 2020-07-02 MED ORDER — OXYCODONE HCL 5 MG PO TABS: 5.0000 mg | ORAL_TABLET | Freq: Once | ORAL | Status: AC | PRN

## 2020-07-02 MED ORDER — SODIUM CHLORIDE 0.9 % IV SOLN: INTRAVENOUS | Status: DC | PRN

## 2020-07-02 MED ORDER — ACETAMINOPHEN 500 MG PO TABS
1000.0000 mg | ORAL_TABLET | Freq: Once | ORAL | Status: AC
  Administered 2020-07-02: 1000 mg via ORAL
  Filled 2020-07-02: qty 2

## 2020-07-02 MED ORDER — LIDOCAINE-EPINEPHRINE 1 %-1:100000 IJ SOLN
INTRAMUSCULAR | Status: AC
  Filled 2020-07-02: qty 1

## 2020-07-02 SURGICAL SUPPLY — 60 items
ADAPTER IRRIG TUBE 2 SPIKE SOL (ADAPTER) ×2 IMPLANT
ADPR TBG 2 SPK PMP STRL ASCP (ADAPTER) ×1
APL PRP STRL LF DISP 70% ISPRP (MISCELLANEOUS)
BLADE SURG SZ11 CARB STEEL (BLADE) ×2 IMPLANT
BNDG ESMARK 6X12 TAN STRL LF (GAUZE/BANDAGES/DRESSINGS) ×2 IMPLANT
BRUSH SCRUB EZ  4% CHG (MISCELLANEOUS) ×1
BRUSH SCRUB EZ 4% CHG (MISCELLANEOUS) ×1 IMPLANT
BUR RADIUS 3.5 (BURR) IMPLANT
BUR RADIUS 4.0X18.5 (BURR) ×2 IMPLANT
CHLORAPREP W/TINT 26 (MISCELLANEOUS) IMPLANT
COOLER POLAR GLACIER W/PUMP (MISCELLANEOUS) IMPLANT
COVER EZ STRL 42X30 (DRAPES) ×4 IMPLANT
COVER WAND RF STERILE (DRAPES) ×2 IMPLANT
CUFF TOURN SGL QUICK 24 (TOURNIQUET CUFF)
CUFF TOURN SGL QUICK 30 (TOURNIQUET CUFF) ×2
CUFF TRNQT CYL 24X4X16.5-23 (TOURNIQUET CUFF) IMPLANT
CUFF TRNQT CYL 30X4X21-28X (TOURNIQUET CUFF) ×1 IMPLANT
CUP MEDICINE 2OZ PLAST GRAD ST (MISCELLANEOUS) ×2 IMPLANT
DRAPE ARTHRO LIMB 89X125 STRL (DRAPES) ×2 IMPLANT
DRAPE IMP U-DRAPE 54X76 (DRAPES) ×2 IMPLANT
DRSG TEGADERM 4X4.75 (GAUZE/BANDAGES/DRESSINGS) ×8 IMPLANT
DRSG TEGADERM 6X8 (GAUZE/BANDAGES/DRESSINGS) ×4 IMPLANT
DRSG TELFA 4X8 ISLAND PHMB (GAUZE/BANDAGES/DRESSINGS) ×2 IMPLANT
ELECT REM PT RETURN 9FT ADLT (ELECTROSURGICAL) ×2
ELECTRODE REM PT RTRN 9FT ADLT (ELECTROSURGICAL) ×1 IMPLANT
GAUZE SPONGE 4X4 12PLY STRL (GAUZE/BANDAGES/DRESSINGS) ×4 IMPLANT
GLOVE SRG 8 PF TXTR STRL LF DI (GLOVE) ×1 IMPLANT
GLOVE SURG ORTHO LTX SZ8 (GLOVE) ×4 IMPLANT
GLOVE SURG SYN 6.5 ES PF (GLOVE) ×2 IMPLANT
GLOVE SURG SYN 7.5  E (GLOVE) ×3
GLOVE SURG SYN 7.5 E (GLOVE) ×3 IMPLANT
GLOVE SURG UNDER POLY LF SZ8 (GLOVE) ×2
GOWN STRL REUS W/ TWL LRG LVL3 (GOWN DISPOSABLE) ×1 IMPLANT
GOWN STRL REUS W/ TWL XL LVL3 (GOWN DISPOSABLE) ×1 IMPLANT
GOWN STRL REUS W/TWL LRG LVL3 (GOWN DISPOSABLE) ×2
GOWN STRL REUS W/TWL XL LVL3 (GOWN DISPOSABLE) ×2
IV LACTATED RINGER IRRG 3000ML (IV SOLUTION) ×18
IV LR IRRIG 3000ML ARTHROMATIC (IV SOLUTION) ×9 IMPLANT
KIT TURNOVER KIT A (KITS) ×2 IMPLANT
MANIFOLD NEPTUNE II (INSTRUMENTS) ×4 IMPLANT
MAT ABSORB  FLUID 56X50 GRAY (MISCELLANEOUS) ×1
MAT ABSORB FLUID 56X50 GRAY (MISCELLANEOUS) ×1 IMPLANT
NEEDLE HYPO 22GX1.5 SAFETY (NEEDLE) ×2 IMPLANT
PACK ARTHROSCOPY KNEE (MISCELLANEOUS) ×2 IMPLANT
PAD ABD DERMACEA PRESS 5X9 (GAUZE/BANDAGES/DRESSINGS) ×4 IMPLANT
PAD WRAPON POLAR KNEE (MISCELLANEOUS) IMPLANT
PADDING CAST 6X4YD NS (MISCELLANEOUS) ×1
PADDING CAST COTTON 6X4 NS (MISCELLANEOUS) ×1 IMPLANT
SET TUBE SUCT SHAVER OUTFL 24K (TUBING) ×2 IMPLANT
SET TUBE TIP INTRA-ARTICULAR (MISCELLANEOUS) ×2 IMPLANT
SLEEVE PROTECTION STRL DISP (MISCELLANEOUS) ×2 IMPLANT
SUT ETHILON 3-0 FS-10 30 BLK (SUTURE) ×2
SUTURE EHLN 3-0 FS-10 30 BLK (SUTURE) ×1 IMPLANT
SWAB CULTURE AMIES ANAERIB BLU (MISCELLANEOUS) ×4 IMPLANT
TAPE TRANSPORE STRL 2 31045 (GAUZE/BANDAGES/DRESSINGS) IMPLANT
TOWEL OR 17X26 4PK STRL BLUE (TOWEL DISPOSABLE) ×2 IMPLANT
TUBING ARTHRO INFLOW-ONLY STRL (TUBING) ×2 IMPLANT
WAND HAND CNTRL MULTIVAC 90 (MISCELLANEOUS) ×2 IMPLANT
WAND WEREWOLF FLOW 90D (MISCELLANEOUS) ×4 IMPLANT
WRAPON POLAR PAD KNEE (MISCELLANEOUS)

## 2020-07-02 NOTE — Consult Note (Signed)
ORTHOPAEDIC CONSULTATION  REQUESTING PHYSICIAN: Duffy Bruce, MD  Chief Complaint:   R knee pain & swelling  History of Present Illness: Jeremy [name] is a 65 y.o. male with a PMHx of A. Fib, mechanical heart valve on Coumadin, CAD s/p CABG, ESRD on dialysis, peripheral vascular disease s/p recent right BKA amputation by Dr. Carlis Abbott at [facility] on 04/14/20. He presents today with a 1 day history of progressive right knee pain and swelling. He is now having severe pain with any attempted motion of the knee. He also had some mental status changes earlier today but this is now improved. He has also had chills and feelings of weakness today.   Per last note from Vascular Surgery on 06/27/20, he has had slow wound healing of the BKA incision, and there was a significant eshcar noted at that visit.   He is on Coumadin and Plavix.   Joint aspiration was performed in the ED. Cell count was 65K WBC with 98% PMNs and negative for crystals. There was concern for sepsis as patient had tachycardia, hypotension, and fever of 99.7. He was started on vancomycin and ceftriaxone. He notes that since aspiration and starting antibiotics, he is feeling improved overall, but knee is still significantly painful and he is unable to flex/extend the knee.   Past Medical History:  Diagnosis Date  . Abnormal stress test 03/19/2018  . 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 Aortic stenosis    a. 2014 s/p mech AVR ([facility])-->chronic coumadin; b. 08/2018 Echo: Mild AS/AI. Nl fxn/gradient.  . Arthritis   . Atrial fibrillation ([place])   . CAD (coronary artery disease)    a. 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 02/2018 [facility]): large inf, infsept, inflat infarct w/ min rev, EF 30%; c. 06/2018 Cath: LM min irregs, LAD 60p, 95/30/75m, LCX min irregs, RCA 40p, 69m, 90d, RPAV 90, RPDA 95ost; d. 09/2018  CABGx3 (LIMA->LAD, VG->D1, VG->PDA).  . Chronic anticoagulation 06/2012   a. Coumadin in the setting of mech AVR and afib.  Marland Kitchen COVID-19 03/15/2019  . CPAP (continuous positive airway pressure) dependence   . Dialysis patient [facility]) 04/07/2006   Patient started HD around 1996,  had 1st transplant LLQ around 1998 until 2007 when they found renal cell cancer in transplant and both native kidneys, all were removed > went back on HD until 2nd transplant RLQ in 2014 which lasted 3 yrs; it was embolized for suspected acute rejection. Is currently on HD MWF in [place] w/ [facility] nephrology.  . ESRD (end stage renal disease) ([place])    a. 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 1999 s/p Kidney transplant; c. 2014 Bilat nephrectomy (transplanted kidney resected) in the setting of renal cell carcinoma; d. PD until 11/2017-->HD.  Marland Kitchen Heart murmur   . HFrEF (heart failure with reduced ejection fraction) ([place])    a. 07/2016 Echo: EF 55-60%; b. 02/2018 MV: EF 30%; c. 04/2018 Echo: EF 35%; d. 08/2018 Echo: EF 30-35%. Glob HK w/o rwma. Mildly reduced RV fxn. Mod to sev dil LA. Nl AoV fxn.  Marland Kitchen Hx of colonoscopy    2009 by Dr. Laural Golden. Normal except for small submucosal lipoma. No evidence of polyps or colitis.  . Hyperlipidemia   . Hypertension   . Hypogonadism in male 07/25/2015  . Ischemic cardiomyopathy    a. 07/2016 Echo: EF 55-60%; b. 02/2018 MV: EF 30%; c. 04/2018  Echo: EF 35%; d. 08/2018 Echo: EF 30-35%.  . OSA (obstructive sleep apnea)    No CPAP  . Permanent atrial fibrillation ([facility])    a. CHA2DS2VASc = 3-->Coumadin (also mech AVR); b. 06/2018 Amio started 2/2 elevated rates.  . Renal cell cancer ([place])   . Renal cell carcinoma ([place]) 08/04/2016   Past Surgical History:  Procedure Laterality Date  . ABDOMINAL AORTOGRAM W/LOWER EXTREMITY N/A 12/22/2019   Procedure: ABDOMINAL AORTOGRAM W/LOWER EXTREMITY;  Surgeon: Marty Heck, MD;  Location: [place] [facility];  Service: Cardiovascular;  Laterality: N/A;   . AMPUTATION Right 04/14/2020   Procedure: RIGHT BELOW KNEE AMPUTATION;  Surgeon: Marty Heck, MD;  Location: [place];  Service: Vascular;  Laterality: Right;  . AORTIC VALVE REPLACEMENT  3 /11 /2014   At [place]    . CAPD INSERTION N/A 02/19/2017   Procedure: LAPAROSCOPIC INSERTION CONTINUOUS AMBULATORY PERITONEAL DIALYSIS  (CAPD) CATHETER;  Surgeon: Algernon Huxley, MD;  Location: [facility];  Service: General;  Laterality: N/A;  . CORONARY ANGIOPLASTY WITH STENT PLACEMENT    . CORONARY ARTERY BYPASS GRAFT N/A 09/16/2018   Procedure: CORONARY ARTERY BYPASS GRAFT times three      with left internal mammary artery and endoharvest of right leg greater saphenous vein;  Surgeon: Melrose Nakayama, MD;  Location: [place];  Service: Open Heart Surgery;  Laterality: N/A;  . CORONARY STENT INTERVENTION N/A 07/15/2019   Procedure: CORONARY STENT INTERVENTION;  Surgeon: Wellington Hampshire, MD;  Location: [place] [facility];  Service: Cardiovascular;  Laterality: N/A;  . DG AV DIALYSIS  SHUNT ACCESS EXIST*L* OR     left arm  . EPIGASTRIC HERNIA REPAIR N/A 02/19/2017   Procedure: HERNIA REPAIR EPIGASTRIC ADULT;  Surgeon: Robert Bellow, MD;  Location: [facility];  Service: General;  Laterality: N/A;  . EYE SURGERY     bilateral cataract  . HERNIA REPAIR     UMBILICAL HERNIA  . HERNIA REPAIR  02/19/2017  . INNER EAR SURGERY     20 years ago  . KIDNEY TRANSPLANT    . LEFT HEART CATH AND CORS/GRAFTS ANGIOGRAPHY N/A 07/15/2019   Procedure: LEFT HEART CATH AND CORS/GRAFTS ANGIOGRAPHY;  Surgeon: Wellington Hampshire, MD;  Location: [place] [facility];  Service: Cardiovascular;  Laterality: N/A;  . LOWER EXTREMITY ANGIOGRAPHY Right 08/25/2019   Procedure: LOWER EXTREMITY ANGIOGRAPHY;  Surgeon: Algernon Huxley, MD;  Location: [place] [facility];  Service: Cardiovascular;  Laterality: Right;  . LOWER EXTREMITY ANGIOGRAPHY Left 09/29/2019   Procedure: Lower Extremity  Angiography;  Surgeon: Algernon Huxley, MD;  Location: [place] [facility];  Service: Cardiovascular;  Laterality: Left;  . PERIPHERAL VASCULAR INTERVENTION Right 12/22/2019   Procedure: PERIPHERAL VASCULAR INTERVENTION;  Surgeon: Marty Heck, MD;  Location: [place] [facility];  Service: Cardiovascular;  Laterality: Right;  . Peritoneal Dialysis access placement  02/19/2017  . PSEUDOANERYSM COMPRESSION N/A 09/29/2019   Procedure: PSEUDOANERYSM COMPRESSION;  Surgeon: Algernon Huxley, MD;  Location: [place] [facility];  Service: Cardiovascular;  Laterality: N/A;  . REMOVAL OF A DIALYSIS CATHETER N/A 11/23/2017   Procedure: REMOVAL OF A PERITONEAL DIALYSIS CATHETER;  Surgeon: Algernon Huxley, MD;  Location: [facility];  Service: Vascular;  Laterality: N/A;  . RIGHT/LEFT HEART CATH AND CORONARY ANGIOGRAPHY Bilateral 06/07/2018   Procedure: RIGHT/LEFT HEART CATH AND CORONARY ANGIOGRAPHY;  Surgeon: Wellington Hampshire, MD;  Location: [place] [facility];  Service: Cardiovascular;  Laterality: Bilateral;  . TEE WITHOUT CARDIOVERSION N/A 07/21/2016   Procedure: TRANSESOPHAGEAL ECHOCARDIOGRAM (TEE);  Surgeon: Wellington Hampshire, MD;  Location: [facility];  Service: Cardiovascular;  Laterality: N/A;  . TEE WITHOUT CARDIOVERSION N/A 09/16/2018   Procedure: TRANSESOPHAGEAL ECHOCARDIOGRAM (TEE);  Surgeon: Melrose Nakayama, MD;  Location: [place];  Service: Open Heart Surgery;  Laterality: N/A;   Social History   Socioeconomic History  . Marital status: Divorced    Spouse name: Not on file  . Number of children: 1  . Years of education: 25  . Highest education level: High school graduate  Occupational History  . Occupation: retired Forensic scientist -Disabled    Comment: Psychologist, educational  Tobacco Use  . Smoking status: Never Smoker  . Smokeless tobacco: Never Used  Vaping Use  . Vaping Use: Never used  Substance and Sexual Activity  . Alcohol use: Not Currently  . Drug use: No  . Sexual activity: Not Currently     Partners: Female  Other Topics Concern  . Not on file  Social History Narrative   Lives at home with girlfriend   1 daughter 1    Right handed    Social Determinants of Health   Financial Resource Strain: Not on file  Food Insecurity: Not on file  Transportation Needs: Not on file  Physical Activity: Not on file  Stress: Not on file  Social Connections: Not on file   Family History  Problem Relation Age of Onset  . Cancer Mother        pancreatic  . Heart disease Father   . Diabetes Father    Allergies  Allergen Reactions  . Statins Other (See Comments)    Arthralgias (intolerance), Myalgias (intolerance)  Pt taking pravastatin   . Gabapentin     Drowsy   . Sertraline Rash   Prior to Admission medications   Medication Sig Start Date End Date Taking? Authorizing Provider  acetaminophen (TYLENOL) 500 MG tablet Take 1 tablet (500 mg total) by mouth every 4 (four) hours as needed. 05/03/20   Love, Ivan Anchors, PA-C  allopurinol (ZYLOPRIM) 100 MG tablet Take 1 tablet (100 mg total) by mouth daily. 05/03/20   Love, Ivan Anchors, PA-C  CHOLECALCIFEROL PO Take by mouth. 05/14/20 05/13/21  [provider]  clopidogrel (PLAVIX) 75 MG tablet Take 1 tablet (75 mg total) by mouth daily with breakfast. 07/29/19   Wellington Hampshire, MD  Epoetin Alfa-epbx (RETACRIT IJ) Epoetin alfa - epbx (Retacrit) 06/04/20 06/03/21  [provider]  ezetimibe (ZETIA) 10 MG tablet TAKE 1 TABLET BY MOUTH EVERY DAY Patient taking differently: Take 10 mg by mouth daily. 03/19/20   Dunn, Areta Haber, PA-C  hydroxychloroquine (PLAQUENIL) 200 MG tablet Take 400 mg by mouth at bedtime. 09/01/19   [provider]  iron sucrose in sodium chloride 0.9 % 100 mL Iron Sucrose (Venofer) 06/13/20 06/05/21  [provider]  lamoTRIgine (LAMICTAL) 25 MG tablet Take 2 tablets (50 mg total) by mouth 2 (two) times daily. 05/03/20   Love, Ivan Anchors, PA-C  lidocaine-prilocaine (EMLA) cream Apply 1 application  topically daily as needed (for fistula).    [provider]  loperamide (IMODIUM A-D) 2 MG tablet Take 2 mg by mouth 4 (four) times daily as needed for diarrhea or loose stools.    [provider]  megestrol (MEGACE) 40 MG/ML suspension TAKE 10 ML (400 MG TOTAL) BY MOUTH DAILY. 05/04/20   [provider]  metoprolol tartrate  75 MG TABS Take 75 mg by mouth 2 (two) times daily. 03/13/20 06/11/20  Wellington Hampshire, MD  midodrine (PROAMATINE) 10 MG tablet TAKE 1 TABLET BY MOUTH 3 TIMES A WEEK AS DIRECTED TAKE 1 TAB BEFORE DIALYSIS MAY REPEAT ONCE AS NEED 06/13/20   [provider]  multivitamin (RENA-VIT) TABS tablet Take 1 tablet by mouth daily. 02/23/19   [provider]  nitroGLYCERIN (NITROSTAT) 0.4 MG SL tablet Place 1 tablet (0.4 mg total) under the tongue every 5 (five) minutes as needed for chest pain. 06/24/19 03/13/20  Loel Dubonnet, NP  oxyCODONE-acetaminophen (PERCOCET/ROXICET) 5-325 MG tablet  03/13/20   [provider]  pantoprazole (PROTONIX) 40 MG tablet Take 1 tablet (40 mg total) by mouth daily. 07/29/19   Wellington Hampshire, MD  polyethylene glycol (MIRALAX / GLYCOLAX) 17 g packet Take 17 g by mouth daily as needed for mild constipation. 05/03/20   Love, Ivan Anchors, PA-C  predniSONE (DELTASONE) 5 MG tablet Take 5 mg by mouth daily. 12/14/19   [provider]  rosuvastatin (CRESTOR) 40 MG tablet Take 1 tablet (40 mg total) by mouth daily at 6 PM. 07/29/19   Wellington Hampshire, MD  sevelamer carbonate (RENVELA) 800 MG tablet Take 3,200 mg by mouth 3 (three) times daily with meals.    [provider]  traZODone (DESYREL) 50 MG tablet TAKE 1/2 -1 TABLET (25-50 MG TOTAL) BY MOUTH AT BEDTIME AS NEEDED FOR SLEEP. Patient taking differently: Take 25-50 mg by mouth at bedtime. 11/10/19   Leone Haven, MD  warfarin (COUMADIN) 1 MG tablet Take 1 to 2 tablets daily as directed by the coumadin clinic 06/11/20   Wellington Hampshire, MD    Recent Labs    07/02/20 1429 07/02/20 1537  WBC 7.5  --   HGB 9.6*  --   HCT 31.4*  --   PLT 125*  --   K 4.1  --   CL 94*  --   CO2 30  --   BUN 24*  --   CREATININE 4.21*  --   GLUCOSE 95  --   CALCIUM 8.7*  --   INR  --  3.4*   DG Chest 2 View  Result Date: 07/02/2020 CLINICAL DATA:  Fever, weakness. EXAM: CHEST - 2 VIEW COMPARISON:  Chest x-ray 04/17/2020, CT chest 04/19/2020 FINDINGS: Similar-appearing cardiac silhouette enlargement. The heart size and mediastinal contours are unchanged. Aortic arch calcifications. Redemonstration of aortic and tricuspid valve replacement. Biapical pleural/pulmonary scarring. No focal consolidation. Question Kerley B lines along the left lower lobe. No definite increased interstitial markings. No pleural effusion. No pneumothorax. No acute osseous abnormality.  Old healed right rib fractures. IMPRESSION: Question Kerley B lines along the left lower lobe suggestive of pulmonary edema. Electronically Signed   By: Iven Finn M.D.   On: 07/02/2020 15:25   DG Knee Right Port  Result Date: 07/02/2020 CLINICAL DATA:  Right knee pain and swelling. EXAM: PORTABLE RIGHT KNEE - 1-2 VIEW COMPARISON:  May 14, 2017. FINDINGS: Status post right below-knee amputation. Vascular calcifications are noted. No acute fracture or dislocation is noted. Small suprapatellar joint effusion is noted. IMPRESSION: Small suprapatellar joint effusion. No acute fracture or dislocation is noted. Electronically Signed   By: Marijo Conception M.D.   On: 07/02/2020 16:29     Positive ROS: All other systems have been reviewed and were otherwise negative with the exception of those mentioned in the HPI and as above.  Physical Exam: BP 119/72   Pulse (!) 124   Temp 99.7 F (37.6 C) (Oral)   Resp 15   Ht 5\' 10"  (1.778 m)   Wt 73.5 kg   SpO2 99%   BMI 23.25 kg/m  General:  Alert, no acute distress Psychiatric:  Patient is competent for consent with normal mood and  affect   Cardiovascular:  No pedal edema, regular rate and rhythm Respiratory:  No wheezing, non-labored breathing GI:  Abdomen is soft and non-tender Skin:  No lesions in the area of chief complaint, no erythema Neurologic:  Sensation intact distally, CN grossly intact Lymphatic:  No axillary or cervical lymphadenopathy  Orthopedic Exam:  RLE: Patient has BKA. Able to flex/ext hip. Difficulty flex/ext knee due to pain SILT distal to knee circumferentially Leg WWP RoM Knee: 20-50 degrees BKA stump with significant eschar formation. No active drainage.    X-rays:  As above: patient is s/p R BKA. There appears to be a joint effusion. No new fractures/dislocations.   Labs: Component     Latest Ref Rng & Units 07/02/2020 07/02/2020 07/02/2020         2:29 PM  3:37 PM  4:25 PM  Color, Synovial     YELLOW   YELLOW (A)  Appearance-Synovial     CLEAR   TURBID (A)  Crystals, Fluid        NO CRYSTALS SEEN  WBC, Synovial     0 - 200 /cu mm   65,146 (H)  Neutrophil, Synovial     %   98  Lymphocytes-Synovial Fld     %   2  Monocyte-Macrophage-Synovial Fluid     %   0  Eosinophils-Synovial     %   0  Other Cells-SYN        0  Lactic Acid, Venous     0.5 - 1.9 mmol/L 2.0 (HH) 2.6 (HH)    Assessment/Plan: 65 yo M with complex medical history including ESRD on dialysis, CAD s/p CABG, a-fib with mechanical valve, and COPD w/OSA presenting with R knee septic arthritis.  1. We discussed the diagnosis and various treatment options. Given his current status, we agreed to urgently proceed to OR for R knee arthroscopic I&D after discussion of risks, benefits, and alternatives to surgery.  2. NPO until OR  3. Plan for admission to Hospitalist vs ICU pending post surgical recovery.    Leim Fabry   07/02/2020 6:24 PM

## 2020-07-02 NOTE — Progress Notes (Signed)
CODE SEPSIS - PHARMACY COMMUNICATION  **Broad Spectrum Antibiotics should be administered within 1 hour of Sepsis diagnosis**  Time Code Sepsis Called/Page Received: 1516  Antibiotics Ordered: ceftriaxone/vancomycin  Time of 1st antibiotic administration: [place] ,PharmD Clinical Pharmacist  07/02/2020  3:53 PM

## 2020-07-02 NOTE — Progress Notes (Signed)
Pharmacy Antibiotic Note  Jeremy [name] is a 65 y.o. male admitted on 07/02/2020 with septic arthritis.  Pharmacy has been consulted for Vancomycin dosing. Pt is ESRD, on HD every MWF.   HD to resume on 3/20.  Plan: Vancomycin 1 gm IV X 1 given on 3/28 @ 1634. Additional Vancomycin 750 mg IV X 1 given on 3/23 @ 2351 to make total loading dose of 1750 mg. Vancomycin 750 mg IV Q MWF - HD ordered to start on 3/30. Will draw 1st Vancomycin trough before 3rd HD session on  4/4.   Height: 5\' 10"  (177.8 cm) Weight: 73.5 kg (162 lb 0.6 oz) IBW/kg (Calculated) : 73  Temp (24hrs), Avg:99.1 F (37.3 C), Min:96.8 F (36 C), Max:100.8 F (38.2 C)  Recent Labs  Lab 07/02/20 1429 07/02/20 1537 07/02/20 1750  WBC 7.5  --   --   CREATININE 4.21*  --   --   LATICACIDVEN 2.0* 2.6* 1.3    Estimated Creatinine Clearance: 18.5 mL/min (A) (by C-G formula based on SCr of 4.21 mg/dL (H)).    Allergies  Allergen Reactions  . Statins Other (See Comments)    Arthralgias (intolerance), Myalgias (intolerance)  Pt taking pravastatin   . Gabapentin     Drowsy   . Sertraline Rash    Antimicrobials this admission:   >>    >>   Dose adjustments this admission:   Microbiology results:  BCx:   UCx:    Sputum:    MRSA PCR:   Thank you for allowing pharmacy to be a part of this patient's care.  Jahnyla Parrillo D 07/02/2020 11:49 PM

## 2020-07-02 NOTE — H&P (Signed)
History and Physical   Jeremy [name]:[id number] DOB: 1955-05-16 DOA: 07/02/2020  Referring MD/NP/PA: Dr. Ellender Hose  PCP: Leone Haven, MD   Outpatient Specialists: Dr. Carlis Abbott, vascular surgery  Patient coming from: Home  Chief Complaint: Right knee swelling  HPI: Jeremy [name] is a 65 y.o. 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 Katheran Awe, patient presented to the ER with significant right knee swelling pain fever and tenderness.  He meets sepsis criteria.  Patient also has significant mild distress.  Seen in the ER and evaluated.  The joint appears swollen tender and septic.  Aspiration showed significant leukocytosis mainly neutrophils, no crystals.  Patient is afebrile.  Orthopedics consulted and patient has had surgery and just completed procedure.  Patient is being admitted to the hospital for continued antibiotic and treatment.  ED Course: Temperature is 100.8 blood pressure is 86/59 pulse 127 respiratory rate of 28 oxygen sats 94% room air.  Synovial fluid is turbid with 65,000 white cell 98% neutrophils.  X-ray of the knee shows small suprapatellar joint effusions no fractures.  Sodium 130 potassium 4 (2 glucose 95 BUN 24 creatinine 4.2 lactic acid is 2.6.  Hemoglobin 9.6.  INR of 3.4.  Patient is admitted for further evaluation and treatment  Review of Systems: As per HPI otherwise 10 point review of systems negative.    Past Medical History:  Diagnosis Date  . Abnormal stress test 03/19/2018  . 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 Aortic stenosis    a. 2014 s/p mech AVR ([facility])-->chronic coumadin; b. 08/2018 Echo: Mild AS/AI. Nl fxn/gradient.   . Arthritis   . Atrial fibrillation ([place])   . CAD (coronary artery disease)    a. 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 02/2018 MV ([facility] large inf, infsept, inflat infarct w/ min rev, EF 30%; c. 06/2018 Cath: LM min irregs, LAD 60p, 95/30/75m, LCX min irregs, RCA 40p, 744m90d, RPAV 90, RPDA 95ost; d. 09/2018 CABGx3 (LIMA->LAD, VG->D1, VG->PDA).  . Chronic anticoagulation 06/2012   a. Coumadin in the setting of mech AVR and afib.  . Marland KitchenOVID-19 03/15/2019  . CPAP (continuous positive airway pressure) dependence   [facility]   Patient started HD around 1996,  had 1st transplant LLQ around 1998 until 2007 when they found renal cell cancer in transplant and both native kidneys, all were removed > went back on HD until 2nd transplant RLQ in 2014 which lasted 3 yrs; it was embolized for suspected acute rejection. [place]  . [place]   a. 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 1999 s/p Kidney transplant; c. 2014 Bilat nephrectomy (transplanted kidney resected) in the setting of renal cell carcinoma; d. PD until 11/2017-->HD.  . Marland Kitcheneart murmur   . HFrEF (heart failure with reduced ejection fraction) ([place]   a. 07/2016 Echo: EF 55-60%; b. 02/2018 MV: EF 30%; c. 04/2018 Echo: EF 35%; d. 08/2018 Echo: EF 30-35%. Glob HK w/o rwma. Mildly reduced RV fxn. Mod to sev dil LA. Nl AoV fxn.  . Marland Kitchenx of colonoscopy  2009 by Dr. Laural Golden. Normal except for small submucosal lipoma. No evidence of polyps or colitis.  . Hyperlipidemia   . Hypertension   . Hypogonadism in male 07/25/2015  . Ischemic cardiomyopathy    a. 07/2016 Echo: EF 55-60%; b. 02/2018 MV: EF 30%; c. 04/2018 Echo: EF 35%; d. 08/2018 Echo: EF 30-35%.  . OSA (obstructive sleep apnea)    No CPAP  . Permanent atrial fibrillation (HCC)    a. CHA2DS2VASc = 3-->Coumadin (also mech AVR); b. 06/2018  Amio started 2/2 elevated rates.  . Renal cell cancer ([place])   . Renal cell carcinoma ([place]) 08/04/2016    Past Surgical History:  Procedure Laterality Date  . ABDOMINAL AORTOGRAM W/LOWER EXTREMITY N/A 12/22/2019   Procedure: ABDOMINAL AORTOGRAM W/LOWER EXTREMITY;  Surgeon: Marty Heck, MD;  Location: [place] [facility];  Service: Cardiovascular;  Laterality: N/A;  . AMPUTATION Right 04/14/2020   Procedure: RIGHT BELOW KNEE AMPUTATION;  Surgeon: Marty Heck, MD;  Location: [place];  Service: Vascular;  Laterality: Right;  . AORTIC VALVE REPLACEMENT  3 /11 /2014   At [place]    . CAPD INSERTION N/A 02/19/2017   Procedure: LAPAROSCOPIC INSERTION CONTINUOUS AMBULATORY PERITONEAL DIALYSIS  (CAPD) CATHETER;  Surgeon: Algernon Huxley, MD;  Location: [facility];  Service: General;  Laterality: N/A;  . CORONARY ANGIOPLASTY WITH STENT PLACEMENT    . CORONARY ARTERY BYPASS GRAFT N/A 09/16/2018   Procedure: CORONARY ARTERY BYPASS GRAFT times three      with left internal mammary artery and endoharvest of right leg greater saphenous vein;  Surgeon: Melrose Nakayama, MD;  Location: [place];  Service: Open Heart Surgery;  Laterality: N/A;  . CORONARY STENT INTERVENTION N/A 07/15/2019   Procedure: CORONARY STENT INTERVENTION;  Surgeon: Wellington Hampshire, MD;  Location: [place] CV LAB;  Service: Cardiovascular;  Laterality: N/A;  . DG AV DIALYSIS  SHUNT ACCESS EXIST*L* OR     left arm  . EPIGASTRIC HERNIA REPAIR N/A 02/19/2017   Procedure: HERNIA REPAIR EPIGASTRIC ADULT;  Surgeon: Robert Bellow, MD;  Location: [facility];  Service: General;  Laterality: N/A;  . EYE SURGERY     bilateral cataract  . HERNIA REPAIR     UMBILICAL HERNIA  . HERNIA REPAIR  02/19/2017  . INNER EAR SURGERY     20 years ago  . KIDNEY TRANSPLANT    . LEFT HEART CATH AND CORS/GRAFTS ANGIOGRAPHY N/A 07/15/2019   Procedure: LEFT HEART CATH AND CORS/GRAFTS ANGIOGRAPHY;   Surgeon: Wellington Hampshire, MD;  Location: [place] [facility];  Service: Cardiovascular;  Laterality: N/A;  . LOWER EXTREMITY ANGIOGRAPHY Right 08/25/2019   Procedure: LOWER EXTREMITY ANGIOGRAPHY;  Surgeon: Algernon Huxley, MD;  Location: [place] [facility];  Service: Cardiovascular;  Laterality: Right;  . LOWER EXTREMITY ANGIOGRAPHY Left 09/29/2019   Procedure: Lower Extremity Angiography;  Surgeon: Algernon Huxley, MD;  Location: [place] [facility];  Service: Cardiovascular;  Laterality: Left;  . PERIPHERAL VASCULAR INTERVENTION Right 12/22/2019   Procedure: PERIPHERAL VASCULAR INTERVENTION;  Surgeon: Marty Heck, MD;  Location: [place] [facility];  Service: Cardiovascular;  Laterality: Right;  . Peritoneal Dialysis access placement  02/19/2017  . PSEUDOANERYSM COMPRESSION N/A 09/29/2019   Procedure: PSEUDOANERYSM COMPRESSION;  Surgeon: Algernon Huxley, MD;  Location: [place] [facility];  Service: Cardiovascular;  Laterality: N/A;  . REMOVAL OF A DIALYSIS CATHETER N/A 11/23/2017   Procedure: REMOVAL OF A  PERITONEAL DIALYSIS CATHETER;  Surgeon: Algernon Huxley, MD;  Location: [facility];  Service: Vascular;  Laterality: N/A;  . RIGHT/LEFT HEART CATH AND CORONARY ANGIOGRAPHY Bilateral 06/07/2018   Procedure: RIGHT/LEFT HEART CATH AND CORONARY ANGIOGRAPHY;  Surgeon: Wellington Hampshire, MD;  Location: [place] [facility];  Service: Cardiovascular;  Laterality: Bilateral;  . TEE WITHOUT CARDIOVERSION N/A 07/21/2016   Procedure: TRANSESOPHAGEAL ECHOCARDIOGRAM (TEE);  Surgeon: Wellington Hampshire, MD;  Location: [facility];  Service: Cardiovascular;  Laterality: N/A;  . TEE WITHOUT CARDIOVERSION N/A 09/16/2018   Procedure: TRANSESOPHAGEAL ECHOCARDIOGRAM (TEE);  Surgeon: Melrose Nakayama, MD;  Location: [place];  Service: Open Heart Surgery;  Laterality: N/A;     reports that he has never smoked. He has never used smokeless tobacco. He reports previous alcohol use. He reports that he does not use  drugs.  Allergies  Allergen Reactions  . Statins Other (See Comments)    Arthralgias (intolerance), Myalgias (intolerance)  Pt taking pravastatin   . Gabapentin     Drowsy   . Sertraline Rash    Family History  Problem Relation Age of Onset  . Cancer Mother        pancreatic  . Heart disease Father   . Diabetes Father      Prior to Admission medications   Medication Sig Start Date End Date Taking? Authorizing Provider  acetaminophen (TYLENOL) 500 MG tablet Take 1 tablet (500 mg total) by mouth every 4 (four) hours as needed. 05/03/20   Love, Ivan Anchors, PA-C  allopurinol (ZYLOPRIM) 100 MG tablet Take 1 tablet (100 mg total) by mouth daily. 05/03/20   Love, Ivan Anchors, PA-C  CHOLECALCIFEROL PO Take by mouth. 05/14/20 05/13/21  [provider]  clopidogrel (PLAVIX) 75 MG tablet Take 1 tablet (75 mg total) by mouth daily with breakfast. 07/29/19   Wellington Hampshire, MD  Epoetin Alfa-epbx (RETACRIT IJ) Epoetin alfa - epbx (Retacrit) 06/04/20 06/03/21  [provider]  ezetimibe (ZETIA) 10 MG tablet TAKE 1 TABLET BY MOUTH EVERY DAY Patient taking differently: Take 10 mg by mouth daily. 03/19/20   Dunn, Areta Haber, PA-C  hydroxychloroquine (PLAQUENIL) 200 MG tablet Take 400 mg by mouth at bedtime. 09/01/19   [provider]  iron sucrose in sodium chloride 0.9 % 100 mL Iron Sucrose (Venofer) 06/13/20 06/05/21  [provider]  lamoTRIgine (LAMICTAL) 25 MG tablet Take 2 tablets (50 mg total) by mouth 2 (two) times daily. 05/03/20   Love, Ivan Anchors, PA-C  lidocaine-prilocaine (EMLA) cream Apply 1 application topically daily as needed (for fistula).    [provider]  loperamide (IMODIUM A-D) 2 MG tablet Take 2 mg by mouth 4 (four) times daily as needed for diarrhea or loose stools.    [provider]  megestrol (MEGACE) 40 MG/ML suspension TAKE 10 ML (400 MG TOTAL) BY MOUTH DAILY. 05/04/20   [provider]  metoprolol tartrate 75 MG TABS Take 75 mg  by mouth 2 (two) times daily. 03/13/20 06/11/20  Wellington Hampshire, MD  midodrine (PROAMATINE) 10 MG tablet TAKE 1 TABLET BY MOUTH 3 TIMES A WEEK AS DIRECTED TAKE 1 TAB BEFORE DIALYSIS MAY REPEAT ONCE AS NEED 06/13/20   [provider]  multivitamin (RENA-VIT) TABS tablet Take 1 tablet by mouth daily. 02/23/19   [provider]  nitroGLYCERIN (NITROSTAT) 0.4 MG SL tablet Place 1 tablet (0.4 mg total) under the tongue every 5 (five) minutes as needed for chest pain. 06/24/19 03/13/20  Laurann Montana  S, NP  oxyCODONE-acetaminophen (PERCOCET/ROXICET) 5-325 MG tablet  03/13/20   [provider]  pantoprazole (PROTONIX) 40 MG tablet Take 1 tablet (40 mg total) by mouth daily. 07/29/19   Wellington Hampshire, MD  polyethylene glycol (MIRALAX / GLYCOLAX) 17 g packet Take 17 g by mouth daily as needed for mild constipation. 05/03/20   Love, Ivan Anchors, PA-C  predniSONE (DELTASONE) 5 MG tablet Take 5 mg by mouth daily. 12/14/19   [provider]  rosuvastatin (CRESTOR) 40 MG tablet Take 1 tablet (40 mg total) by mouth daily at 6 PM. 07/29/19   Wellington Hampshire, MD  sevelamer carbonate (RENVELA) 800 MG tablet Take 3,200 mg by mouth 3 (three) times daily with meals.    [provider]  traZODone (DESYREL) 50 MG tablet TAKE 1/2 -1 TABLET (25-50 MG TOTAL) BY MOUTH AT BEDTIME AS NEEDED FOR SLEEP. Patient taking differently: Take 25-50 mg by mouth at bedtime. 11/10/19   Leone Haven, MD  warfarin (COUMADIN) 1 MG tablet Take 1 to 2 tablets daily as directed by the coumadin clinic 06/11/20   Wellington Hampshire, MD    Physical Exam: Vitals:   07/02/20 1730 07/02/20 1800 07/02/20 1830 07/02/20 2315  BP: (!) 88/67 119/72 127/75 134/66  Pulse: (!) 123 (!) 124 (!) 124 (!) 111  Resp: '16 15 14 17  ' Temp: 99.7 F (37.6 C)   (!) 96.8 F (36 C)  TempSrc: Oral     SpO2: 99% 99% 98% 100%  Weight:      Height:          Constitutional: NAD, calm, comfortable Vitals:   07/02/20 1730  07/02/20 1800 07/02/20 1830 07/02/20 2315  BP: (!) 88/67 119/72 127/75 134/66  Pulse: (!) 123 (!) 124 (!) 124 (!) 111  Resp: '16 15 14 17  ' Temp: 99.7 F (37.6 C)   (!) 96.8 F (36 C)  TempSrc: Oral     SpO2: 99% 99% 98% 100%  Weight:      Height:       Eyes: PERRL, lids and conjunctivae normal ENMT: Mucous membranes are moist. Posterior pharynx clear of any exudate or lesions.Normal dentition.  Neck: normal, supple, no masses, no thyromegaly Respiratory: clear to auscultation bilaterally, no wheezing, no crackles. Normal respiratory effort. No accessory muscle use.  Cardiovascular: Regular rate and rhythm, no murmurs / rubs / gallops. No extremity edema. 2+ pedal pulses. No carotid bruits.  Abdomen: no tenderness, no masses palpated. No hepatosplenomegaly. Bowel sounds positive.  Musculoskeletal: no clubbing / cyanosis. No joint deformity upper and lower extremities. Good ROM, no contractures. Normal muscle tone.  Skin: no rashes, lesions, ulcers. No induration Neurologic: CN 2-12 grossly intact. Sensation intact, DTR normal. Strength 5/5 in all 4.  Psychiatric: Normal judgment and insight. Alert and oriented x 3. Normal mood.     Labs on Admission: I have personally reviewed following labs and imaging studies  CBC: Recent Labs  Lab 07/02/20 1429  WBC 7.5  NEUTROABS 5.4  HGB 9.6*  HCT 31.4*  MCV 104.3*  PLT 202*   Basic Metabolic Panel: Recent Labs  Lab 07/02/20 1429  NA 138  K 4.1  CL 94*  CO2 30  GLUCOSE 95  BUN 24*  CREATININE 4.21*  CALCIUM 8.7*   GFR: Estimated Creatinine Clearance: 18.5 mL/min (A) (by C-G formula based on SCr of 4.21 mg/dL (H)). Liver Function Tests: Recent Labs  Lab 07/02/20 1429  AST 25  ALT 18  ALKPHOS 127*  BILITOT 1.0  PROT 7.6  ALBUMIN 3.5   No results for input(s): LIPASE, AMYLASE in the last 168 hours. No results for input(s): AMMONIA in the last 168 hours. Coagulation Profile: Recent Labs  Lab 06/26/20 0000  07/02/20 1537  INR 4.2* 3.4*   Cardiac Enzymes: No results for input(s): CKTOTAL, CKMB, CKMBINDEX, TROPONINI in the last 168 hours. BNP (last 3 results) No results for input(s): PROBNP in the last 8760 hours. HbA1C: No results for input(s): HGBA1C in the last 72 hours. CBG: No results for input(s): GLUCAP in the last 168 hours. Lipid Profile: No results for input(s): CHOL, HDL, LDLCALC, TRIG, CHOLHDL, LDLDIRECT in the last 72 hours. Thyroid Function Tests: No results for input(s): TSH, T4TOTAL, FREET4, T3FREE, THYROIDAB in the last 72 hours. Anemia Panel: No results for input(s): VITAMINB12, FOLATE, FERRITIN, TIBC, IRON, RETICCTPCT in the last 72 hours. Urine analysis:    Component Value Date/Time   COLORURINE AMBER (A) 07/17/2016 1531   APPEARANCEUR CLOUDY (A) 07/17/2016 1531   LABSPEC 1.030 07/17/2016 1531   PHURINE 8.0 07/17/2016 1531   GLUCOSEU 50 (A) 07/17/2016 1531   HGBUR NEGATIVE 07/17/2016 1531   BILIRUBINUR NEGATIVE 07/17/2016 1531   KETONESUR NEGATIVE 07/17/2016 1531   PROTEINUR 100 (A) 07/17/2016 1531   NITRITE NEGATIVE 07/17/2016 1531   LEUKOCYTESUR NEGATIVE 07/17/2016 1531   Sepsis Labs: '@LABRCNTIP' (procalcitonin:4,lacticidven:4) ) Recent Results (from the past 240 hour(s))  Resp Panel by RT-PCR (Flu A&B, Covid) Nasopharyngeal Swab     Status: None   Collection Time: 07/02/20  3:37 PM   Specimen: Nasopharyngeal Swab; Nasopharyngeal(NP) swabs in vial transport medium  Result Value Ref Range Status   SARS Coronavirus 2 by RT PCR NEGATIVE NEGATIVE Final    Comment: (NOTE) SARS-CoV-2 target nucleic acids are NOT DETECTED.  The SARS-CoV-2 RNA is generally detectable in upper respiratory specimens during the acute phase of infection. The lowest concentration of SARS-CoV-2 viral copies this assay can detect is 138 copies/mL. A negative result does not preclude SARS-Cov-2 infection and should not be used as the sole basis for treatment or other patient  management decisions. A negative result may occur with  improper specimen collection/handling, submission of specimen other than nasopharyngeal swab, presence of viral mutation(s) within the areas targeted by this assay, and inadequate number of viral copies(<138 copies/mL). A negative result must be combined with clinical observations, patient history, and epidemiological information. The expected result is Negative.  Fact Sheet for Patients:  EntrepreneurPulse.com.au  Fact Sheet for Healthcare Providers:  IncredibleEmployment.be  This test is no t yet approved or cleared by the [place] FDA and  has been authorized for detection and/or diagnosis of SARS-CoV-2 by FDA under an Emergency Use Authorization (EUA). This EUA will remain  in effect (meaning this test can be used) for the duration of the COVID-19 declaration under Section 564(b)(1) of the Act, 21 U.S.C.section 360bbb-3(b)(1), unless the authorization is terminated  or revoked sooner.       Influenza A by PCR NEGATIVE NEGATIVE Final   Influenza B by PCR NEGATIVE NEGATIVE Final    Comment: (NOTE) The Xpert Xpress SARS-CoV-2/FLU/RSV plus assay is intended as an aid in the diagnosis of influenza from Nasopharyngeal swab specimens and should not be used as a sole basis for treatment. Nasal washings and aspirates are unacceptable for Xpert Xpress SARS-CoV-2/FLU/RSV testing.  Fact Sheet for Patients: EntrepreneurPulse.com.au  Fact Sheet for Healthcare Providers: IncredibleEmployment.be  This test is not yet approved or cleared by the [place] FDA and has  been authorized for detection and/or diagnosis of SARS-CoV-2 by FDA under an Emergency Use Authorization (EUA). This EUA will remain in effect (meaning this test can be used) for the duration of the COVID-19 declaration under Section 564(b)(1) of the Act, 21 U.S.C. section 360bbb-3(b)(1),  unless the authorization is terminated or revoked.  Performed at [facility], [address], [place], [place] [address]      Radiological Exams on Admission: DG Chest 2 View  Result Date: 07/02/2020 CLINICAL DATA:  Fever, weakness. EXAM: CHEST - 2 VIEW COMPARISON:  Chest x-ray 04/17/2020, CT chest 04/19/2020 FINDINGS: Similar-appearing cardiac silhouette enlargement. The heart size and mediastinal contours are unchanged. Aortic arch calcifications. Redemonstration of aortic and tricuspid valve replacement. Biapical pleural/pulmonary scarring. No focal consolidation. Question Kerley B lines along the left lower lobe. No definite increased interstitial markings. No pleural effusion. No pneumothorax. No acute osseous abnormality.  Old healed right rib fractures. IMPRESSION: Question Kerley B lines along the left lower lobe suggestive of pulmonary edema. Electronically Signed   By: Iven Finn M.D.   On: 07/02/2020 15:25   DG Knee Right Port  Result Date: 07/02/2020 CLINICAL DATA:  Right knee pain and swelling. EXAM: PORTABLE RIGHT KNEE - 1-2 VIEW COMPARISON:  May 14, 2017. FINDINGS: Status post right below-knee amputation. Vascular calcifications are noted. No acute fracture or dislocation is noted. Small suprapatellar joint effusion is noted. IMPRESSION: Small suprapatellar joint effusion. No acute fracture or dislocation is noted. Electronically Signed   By: Marijo Conception M.D.   On: 07/02/2020 16:29    Assessment/Plan Principal Problem:   Sepsis ([place]) Active Problems:   ESRD (end stage renal disease) ([place])   CAD (coronary artery disease)   Hyperlipidemia   Anemia in chronic kidney disease   Atrial fibrillation (HCC)   Aortic valve disorder   Below-knee amputation of left lower extremity (HCC)   Septic arthritis ([place])     #1 severe sepsis: Secondary to septic arthritis.  Patient also has known history mechanical valve.  Currently on Coumadin.  Admit the patient.   He is status post surgery at the moment.  We will continue with IV antibiotics as well as treatment with the sepsis protocol.  Mainly cellulitis time.  Depend on the blood cultures we will proceed further.  We will be careful depending on blood cultures in case patient develops agitation.  #2 septic arthritis: Continue per orthopedics.  Status post surgery by Dr. Posey Pronto.  Will follow closely.  Cultures pending.  #3 end-stage renal disease: Resume hemodialysis.  #4 peripheral vascular disease: Status post right BKA this January.  #5 coronary artery disease, stable.  No evidence of decompensation.  Continue monitoring  #6 atrial fibrillation: Rate is controlled.  Patient on Coumadin.  Will resume Coumadin.  #7 status post atrial valve placement: Patient is stable.  Continue with anticoagulation   DVT prophylaxis: Warfarin Code Status: Full Family Communication: No family at bedside Disposition Plan: To be determined Consults called: Dr. Posey Pronto, orthopedics Admission status: Inpatient  Severity of Illness: The appropriate patient status for this patient is INPATIENT. Inpatient status is judged to be reasonable and necessary in order to provide the required intensity of service to ensure the patient's safety. The patient's presenting symptoms, physical exam findings, and initial radiographic and laboratory data in the context of their chronic comorbidities is felt to place them at high risk for further clinical deterioration. Furthermore, it is not anticipated that the patient will be medically stable for discharge from the  hospital within 2 midnights of admission. The following factors support the patient status of inpatient.   " The patient's presenting symptoms include right knee pain and swelling. " The worrisome physical exam findings include evidence of septic arthritis. " The initial radiographic and laboratory data are worrisome because of sepsis criteria met. " The chronic  co-morbidities include end-stage renal disease.   * I certify that at the point of admission it is my clinical judgment that the patient will require inpatient hospital care spanning beyond 2 midnights from the point of admission due to high intensity of service, high risk for further deterioration and high frequency of surveillance required.Barbette Merino MD [facility] Pager [phone]  If 7PM-7AM, please contact night-coverage [email or web address] Password [facility]  07/02/2020, 11:26 PM

## 2020-07-02 NOTE — Progress Notes (Signed)
ANTICOAGULATION CONSULT NOTE - Initial Consult  Pharmacy Consult for Warfarin  Indication: atrial fibrillation  Allergies  Allergen Reactions  . Statins Other (See Comments)    Arthralgias (intolerance), Myalgias (intolerance)  Pt taking pravastatin   . Gabapentin     Drowsy   . Sertraline Rash    Patient Measurements: Height: 5\' 10"  (177.8 cm) Weight: 73.5 kg (162 lb 0.6 oz) IBW/kg (Calculated) : 73 Heparin Dosing Weight:   Vital Signs: Temp: 96.8 F (36 C) (03/28 2315) Temp Source: Oral (03/28 1730) BP: 115/75 (03/28 2350) Pulse Rate: 112 (03/28 2350)  Labs: Recent Labs    07/02/20 1429 07/02/20 1537  HGB 9.6*  --   HCT 31.4*  --   PLT 125*  --   LABPROT  --  33.2*  INR  --  3.4*  CREATININE 4.21*  --     Estimated Creatinine Clearance: 18.5 mL/min (A) (by C-G formula based on SCr of 4.21 mg/dL (H)).   Medical History: Past Medical History:  Diagnosis Date  . Abnormal stress test 03/19/2018  . 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 Aortic stenosis    a. 2014 s/p mech AVR ([facility])-->chronic coumadin; b. 08/2018 Echo: Mild AS/AI. Nl fxn/gradient.  . Arthritis   . Atrial fibrillation ([place])   . CAD (coronary artery disease)    a. 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 02/2018 MV [facility]): large inf, infsept, inflat infarct w/ min rev, EF 30%; c. 06/2018 Cath: LM min irregs, LAD 60p, 95/30/66m, LCX min irregs, RCA 40p, 45m, 90d, RPAV 90, RPDA 95ost; d. 09/2018 CABGx3 (LIMA->LAD, VG->D1, VG->PDA).  . Chronic anticoagulation 06/2012   a. Coumadin in the setting of mech AVR and afib.  Marland Kitchen COVID-19 03/15/2019  . CPAP (continuous positive airway pressure) dependence   . Dialysis patient [facility]) 04/07/2006   Patient started HD around 1996,  had 1st transplant LLQ around 1998 until 2007 when they found renal cell cancer in transplant and both native kidneys, all were removed > went back on HD until 2nd  transplant RLQ in 2014 which lasted 3 yrs; it was embolized for suspected acute rejection. Is currently on HD MWF in [place] w/ [facility] nephrology.  . ESRD (end stage renal disease) ([place])    a. 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 1999 s/p Kidney transplant; c. 2014 Bilat nephrectomy (transplanted kidney resected) in the setting of renal cell carcinoma; d. PD until 11/2017-->HD.  Marland Kitchen Heart murmur   . HFrEF (heart failure with reduced ejection fraction) ([place])    a. 07/2016 Echo: EF 55-60%; b. 02/2018 MV: EF 30%; c. 04/2018 Echo: EF 35%; d. 08/2018 Echo: EF 30-35%. Glob HK w/o rwma. Mildly reduced RV fxn. Mod to sev dil LA. Nl AoV fxn.  Marland Kitchen Hx of colonoscopy    2009 by Dr. Laural Golden. Normal except for small submucosal lipoma. No evidence of polyps or colitis.  . Hyperlipidemia   . Hypertension   . Hypogonadism in male 07/25/2015  . Ischemic cardiomyopathy    a. 07/2016 Echo: EF 55-60%; b. 02/2018 MV: EF 30%; c. 04/2018 Echo: EF 35%; d. 08/2018 Echo: EF 30-35%.  . OSA (obstructive sleep apnea)    No CPAP  . Permanent atrial fibrillation ([facility])    a. CHA2DS2VASc = 3-->Coumadin (also mech AVR); b. 06/2018 Amio started 2/2 elevated rates.  . Renal cell cancer ([facility] [place])   . Renal cell carcinoma ([place])  08/04/2016    Medications:  Medications Prior to Admission  Medication Sig Dispense Refill Last Dose  . acetaminophen (TYLENOL) 500 MG tablet Take 1 tablet (500 mg total) by mouth every 4 (four) hours as needed. 30 tablet 0   . allopurinol (ZYLOPRIM) 100 MG tablet Take 1 tablet (100 mg total) by mouth daily. 30 tablet 0   . CHOLECALCIFEROL PO Take by mouth.     . clopidogrel (PLAVIX) 75 MG tablet Take 1 tablet (75 mg total) by mouth daily with breakfast. 90 tablet 3   . Epoetin Alfa-epbx (RETACRIT IJ) Epoetin alfa - epbx (Retacrit)     . ezetimibe (ZETIA) 10 MG tablet TAKE 1 TABLET BY MOUTH EVERY DAY (Patient taking differently: Take 10 mg by mouth daily.) 90 tablet 1   . hydroxychloroquine (PLAQUENIL) 200 MG  tablet Take 400 mg by mouth at bedtime.     . iron sucrose in sodium chloride 0.9 % 100 mL Iron Sucrose (Venofer)     . lamoTRIgine (LAMICTAL) 25 MG tablet Take 2 tablets (50 mg total) by mouth 2 (two) times daily. 120 tablet 0   . lidocaine-prilocaine (EMLA) cream Apply 1 application topically daily as needed (for fistula).     . loperamide (IMODIUM A-D) 2 MG tablet Take 2 mg by mouth 4 (four) times daily as needed for diarrhea or loose stools.     . megestrol (MEGACE) 40 MG/ML suspension TAKE 10 ML (400 MG TOTAL) BY MOUTH DAILY.     . metoprolol tartrate 75 MG TABS Take 75 mg by mouth 2 (two) times daily. 180 tablet 3   . midodrine (PROAMATINE) 10 MG tablet TAKE 1 TABLET BY MOUTH 3 TIMES A WEEK AS DIRECTED TAKE 1 TAB BEFORE DIALYSIS MAY REPEAT ONCE AS NEED     . multivitamin (RENA-VIT) TABS tablet Take 1 tablet by mouth daily.     . nitroGLYCERIN (NITROSTAT) 0.4 MG SL tablet Place 1 tablet (0.4 mg total) under the tongue every 5 (five) minutes as needed for chest pain. 25 tablet 3   . oxyCODONE-acetaminophen (PERCOCET/ROXICET) 5-325 MG tablet      . pantoprazole (PROTONIX) 40 MG tablet Take 1 tablet (40 mg total) by mouth daily. 90 tablet 3   . polyethylene glycol (MIRALAX / GLYCOLAX) 17 g packet Take 17 g by mouth daily as needed for mild constipation. 30 each 0   . predniSONE (DELTASONE) 5 MG tablet Take 5 mg by mouth daily.     . rosuvastatin (CRESTOR) 40 MG tablet Take 1 tablet (40 mg total) by mouth daily at 6 PM. 90 tablet 3   . sevelamer carbonate (RENVELA) 800 MG tablet Take 3,200 mg by mouth 3 (three) times daily with meals.     . traZODone (DESYREL) 50 MG tablet TAKE 1/2 -1 TABLET (25-50 MG TOTAL) BY MOUTH AT BEDTIME AS NEEDED FOR SLEEP. (Patient taking differently: Take 25-50 mg by mouth at bedtime.) 90 tablet 2   . warfarin (COUMADIN) 1 MG tablet Take 1 to 2 tablets daily as directed by the coumadin clinic 90 tablet 0     Assessment: Pharmacy consulted to dose warfarin for Afib in  this 65 year old male admitted with septic arthritis.  Pt is s/p I&D of septic knee on 3/28.   Was on warfarin 1 - 2 mg po daily ( managed by coumadin clinic).  Uncertain of last dose.   3/28: INR @ 1537 = 3.4   Goal of Therapy:  INR 2-3 Monitor platelets by  anticoagulation protocol: Yes   Plan:  INR on 3/28 @ 1537 = 3.4 Pt is s/p I&D of septic knee.   No record of Vit K given prior to procedure.  Will hold warfarin for 3/28.  Will recheck INR on 3/29 with AM labs.   Ambriel Gorelick D 07/02/2020,11:54 PM

## 2020-07-02 NOTE — Op Note (Signed)
Operative Note   SURGERY DATE: 07/02/2020  PRE-OP DIAGNOSIS:  1. Right knee septic arthritis  POST-OP DIAGNOSIS: 1. Right knee septic arthritis 2. Right knee osteoarthritis 3.  Right knee lateral meniscus tear  PROCEDURES:  1. Right knee arthroscopic irrigation and debridement/lavage and drainage 2. Right knee partial lateral meniscectomy 3. Right knee synovectomy  SURGEON: Cato Mulligan, MD  ANESTHESIA: Gen  ESTIMATED BLOOD LOSS:minimal  TOTAL IV FLUIDS: per anesthesia  INDICATION(S):  Jeremy [name] is a 65 y.o. male with 1 day history of severe knee pain and swelling. He presented to the ED, where joint aspiration showed >65000 WBCs with 98% PMNs and negative crystals. At the time of presentation to the ED, the patient had hypotension, tachycardia, and a low-grade fever with chills and mild mental status changes.  Clinical exam was also concerning for septic arthritis.  Of note, the patient underwent a below-knee amputation approximately 2 months ago and has had delayed healing of the wound with significant eschar formation.  After discussion of risks, benefits, and alternatives to surgery, the patient and family elected to proceed in an urgent fashion.   OPERATIVE FINDINGS:   Examination under anesthesia:A careful examination under anesthesia was performed. Passive range of motion was: Hyperextension: none. Extension: 20. Flexion: 95. Unable to correct valgus deformity to neutral.  Intra-operative findings:A thorough arthroscopic examination of the knee was performed. The findings are: 1. Suprapatellar pouch: Significant synovitic changes 2. Undersurface of median ridge: Grade 1 degenerative changes 3. Medial patellar facet: Grade 1 degenerative changes 4. Lateral patellar facet: Grade 1 degenerative changes 5. Trochlea: Grade 1 degenerative changes 6. Lateral gutter/popliteus tendon: Significant synovitis 7. Hoffa's fat pad: Inflamed with significant  synovitis  8. Medial gutter/plica: Significant synovitis 9. ACL: Normal 10. PCL: Normal 11. Medial meniscus: degenerative appearing, but intact 12. Medial compartment cartilage: Significant areas of grade 4 degenerative changes to the femoral condyle and tibial plateau 13. Lateral meniscus: Complete tear of the anterior horn and degenerative tearing of the posterior horn 14. Lateral compartment cartilage: Significant areas of grade 4 degenerative changes to the femoral condyle and tibial plateau  OPERATIVE REPORT:   I identified Jeremy [name] the pre-operative holding area. I marked theoperativeknee with my initials. I reviewed the risks and benefits of the proposed surgical intervention and the patient (and/or patient's guardian) wished to proceed. The patient was transferred to the operative suite and placed in the supine position with all bony prominences padded. Anesthesia was administered. IV antibioticswere held until after cultures were obtained. The extremity was then prepped and draped in standard fashion. A time out was performed confirming the correct extremity, correct patient, and correct procedure.  Arthroscopy portals were marked.  Care was taken to keep the below-knee amputation stump incision padded and dry throughout the procedure.  This was accomplished using 4 x 4 gauze sponges with Tegaderm dressings around the gauze sponges.  The anterolateral portalwasestablished with an 11blade. The arthroscope was placed in the anterolateral portal and theninto the suprapatellar pouch.  There was return of purulent material. Culture samples were taken of the fluid egress through the arthroscope sheath once the arthroscope trocar was removed. Two samples were obtained.   Next the prior anteromedial portal was established. A shaver was introduced to help move fluid through the joint and improve visualization. A diagnostic arthroscopy was performed with findings as indicated above.  A partial lateral meniscectomy was performed using an oscillating shaver.  This involved resection of almost the entire anterior horn of  the lateral meniscus and segments of the posterior horn.  Chondroplasty was performed of the medial compartment and lateral compartment such that there were stable cartilage edges without any loose fragments of cartilage.  Arthroscopic lavage was performed in all compartments until there was consistent clear fluid returning. The shaver was used to debride the synovium in all compartments of the knee. The inflamed fat pad was also debrided. A radiofrequency device was used to cauterize bleeding vessels.  A Hemovac drain was placed through the anterolateral portal into the suprapatellar pouch.  Arthroscopic fluid was removed from the joint.   The portals were closed with 3-0 Nylon suture. Local anesthetic was injected about the portal sites and into the knee joint. Sterile dressings included Xeroform, 4x4s, Sof-Rol, and Bias wrap. The patient was then awakened and taken to the PACU without complication.   POSTOPERATIVE PLAN: The patient will be continued on broad spectrum IV antibiotics. Infectious Diseases consult. Follow up OR culture results. No weightbearing restrictions from the surgery, but patient is currently nonweightbearing given his BKA healing.  Patient on full anticoagulation currently.

## 2020-07-02 NOTE — Sepsis Progress Note (Signed)
Notified bedside nurse of need to draw repeat lactic acid. 

## 2020-07-02 NOTE — Progress Notes (Signed)
PHARMACY -  BRIEF ANTIBIOTIC NOTE   Pharmacy has received consult(s) for vancomycin from an ED provider.  The patient's profile has been reviewed for ht/wt/allergies/indication/available labs.    One time order(s) placed for vancomycin 1000 mg  Further antibiotics/pharmacy consults should be ordered by admitting physician if indicated.                       Thank you,  Tawnya Crook, PharmD 07/02/2020  3:43 PM

## 2020-07-02 NOTE — ED Triage Notes (Signed)
Woke up yesterday morning, right knee swelling.  Ice pack applied, no relief.  Oxycodone given with relief.  Wife reports that today, patient has been shaking and feeling weak.  Decreased PO intake today.  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

## 2020-07-02 NOTE — Sepsis Progress Note (Signed)
Sepsis protocol is being followed by [facility]. 

## 2020-07-02 NOTE — Transfer of Care (Signed)
Immediate Anesthesia Transfer of Care Note  Patient: Jeremy [name]  Procedure(s) Performed: KNEE ARTHROSCOPY WITH INCISION AND DRAINAGE (Right Knee)  Patient Location: PACU  Anesthesia Type:General  Level of Consciousness: drowsy and patient cooperative  Airway & Oxygen Therapy: Patient Spontanous Breathing and Patient connected to face mask oxygen  Post-op Assessment: Report given to RN and Post -op Vital signs reviewed and stable  Post vital signs: Reviewed and stable  Last Vitals:  Vitals Value Taken Time  BP 134/66 07/02/20 2315  Temp 36 C 07/02/20 2315  Pulse 110 07/02/20 2317  Resp 17 07/02/20 2317  SpO2 100 % 07/02/20 2317  Vitals shown include unvalidated device data.  Last Pain:  Vitals:   07/02/20 1730  TempSrc: Oral  PainSc:          Complications: No complications documented.

## 2020-07-02 NOTE — Sepsis Progress Note (Signed)
Notified provider of need to order 500cc fluid bolus based off of pt weight.

## 2020-07-02 NOTE — Telephone Encounter (Signed)
[facility] PT called for signed form to schedule patient appointment need fax back today from [facility]

## 2020-07-02 NOTE — Anesthesia Procedure Notes (Signed)
Procedure Name: Intubation Date/Time: 07/02/2020 9:12 PM Performed by: Jerrye Noble, CRNA Pre-anesthesia Checklist: Patient identified, Emergency Drugs available, Suction available and Patient being monitored Patient Re-evaluated:Patient Re-evaluated prior to induction Oxygen Delivery Method: Circle system utilized Preoxygenation: Pre-oxygenation with 100% oxygen Induction Type: IV induction and Rapid sequence Laryngoscope Size: McGraph and 4 Grade View: Grade I Tube type: Oral Tube size: 7.0 mm Number of attempts: 1 Airway Equipment and Method: Stylet and Oral airway Placement Confirmation: ETT inserted through vocal cords under direct vision,  positive ETCO2 and breath sounds checked- equal and bilateral Secured at: 23 cm Tube secured with: Tape Dental Injury: Teeth and Oropharynx as per pre-operative assessment

## 2020-07-02 NOTE — ED Provider Notes (Signed)
[facility] Emergency Department Provider Note  ____________________________________________   Event Date/Time   First MD Initiated Contact with Patient 07/02/20 1459     (approximate)  I have reviewed the triage vital signs and the nursing notes.   HISTORY  Chief Complaint Weakness    HPI Jeremy [name] is a 65 y.o. male with past medical history as below including A. fib, coronary disease, ESRD, peripheral vascular disease, status post recent right BKA amputation here with right knee pain, fever, weakness.  The patient states that he was in his usual state of health until the last 24 hours.  He states he has had progressively worsening and now 10 out of 10 severe, right knee pain along with knee swelling.  He has been essentially unable to move his knee due to this pain.  Denies any trauma.  Denies any skin changes.  He states the wound of his right lower extremity is improving, though it remains red.  Wife states that she feels like it is improving.  Denies any drainage from the wound.  No foul odor.  No trauma.  No other complaints.        Past Medical History:  Diagnosis Date  . Abnormal stress test 03/19/2018  . 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 Aortic stenosis    a. 2014 s/p mech AVR ([facility])-->chronic coumadin; b. 08/2018 Echo: Mild AS/AI. Nl fxn/gradient.  . Arthritis   . Atrial fibrillation ([place])   . CAD (coronary artery disease)    a. 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 02/2018 MV [facility]): large inf, infsept, inflat infarct w/ min rev, EF 30%; c. 06/2018 Cath: LM min irregs, LAD 60p, 95/30/43m, LCX min irregs, RCA 40p, 48m, 90d, RPAV 90, RPDA 95ost; d. 09/2018 CABGx3 (LIMA->LAD, VG->D1, VG->PDA).  . Chronic anticoagulation 06/2012   a. Coumadin in the setting of mech AVR and afib.  Marland Kitchen COVID-19 03/15/2019  . CPAP (continuous positive airway pressure) dependence   . Dialysis  patient [facility]) 04/07/2006   Patient started HD around 1996,  had 1st transplant LLQ around 1998 until 2007 when they found renal cell cancer in transplant and both native kidneys, all were removed > went back on HD until 2nd transplant RLQ in 2014 which lasted 3 yrs; it was embolized for suspected acute rejection. Is currently on HD MWF in [place] w/ [facility] nephrology.  . ESRD (end stage renal disease) ([place])    a. CKD 2/2 to IgA nephropathy (dx age 89); b. 1999 s/p Kidney transplant; c. 2014 Bilat nephrectomy (transplanted kidney resected) in the setting of renal cell carcinoma; d. PD until 11/2017-->HD.  Marland Kitchen Heart murmur   . HFrEF (heart failure with reduced ejection fraction) ([place])    a. 07/2016 Echo: EF 55-60%; b. 02/2018 MV: EF 30%; c. 04/2018 Echo: EF 35%; d. 08/2018 Echo: EF 30-35%. Glob HK w/o rwma. Mildly reduced RV fxn. Mod to sev dil LA. Nl AoV fxn.  Marland Kitchen Hx of colonoscopy    2009 by Dr. Laural Golden. Normal except for small submucosal lipoma. No evidence of polyps or colitis.  . Hyperlipidemia   . Hypertension   . Hypogonadism in male 07/25/2015  . Ischemic cardiomyopathy    a. 07/2016 Echo: EF 55-60%; b. 02/2018 MV: EF 30%; c. 04/2018 Echo: EF 35%; d. 08/2018 Echo: EF 30-35%.  . OSA (obstructive sleep apnea)    No CPAP  . Permanent  atrial fibrillation (HCC)    a. CHA2DS2VASc = 3-->Coumadin (also mech AVR); b. 06/2018 Amio started 2/2 elevated rates.  . Renal cell cancer ([place])   . Renal cell carcinoma ([place]) 08/04/2016    Patient Active Problem List   Diagnosis Date Noted  . Hypercalcemia 06/12/2020  . Weight loss 05/31/2020  . Bronchitis 05/24/2020  . Postoperative pain   . Rheumatoid arthritis ([place])   . Anemia of chronic disease   . Macrocytosis   . Below-knee amputation of left lower extremity ([place]) 04/23/2020  . Critical lower limb ischemia ([place]) 04/14/2020  . Ischemic neuropathy of right foot 03/20/2020  . Femoral neuropathy of left lower extremity 03/20/2020  . Peripheral vascular  disease ([place]) 12/26/2019  . PAD (peripheral artery disease) ([place]) 12/21/2019  . ILD (interstitial lung disease) ([place]) 12/06/2019  . Cough 12/06/2019  . Fatigue 10/19/2019  . Difficulty sleeping 10/19/2019  . Contusion of unspecified thigh, initial encounter 10/11/2019  . Other acute postprocedural pain 10/11/2019  . Demand ischemia ([place])   . Atrial flutter ([place])   . Non-ST elevation (NSTEMI) myocardial infarction ([place])   . Elevated troponin 10/01/2019  . Thigh hematoma 10/01/2019  . Postprocedural hypotension   . Hematoma of thigh, left, initial encounter 09/29/2019  . Unstable angina ([place]) 07/13/2019  . Atherosclerosis of native arteries of the extremities with ulceration ([place]) 07/05/2019  . Foot ulcer ([place]) 05/27/2019  . Skin lesion 11/17/2018  . Complication of vascular access for dialysis 10/01/2018  . S/P CABG x 3 09/16/2018  . Hydrocele 08/16/2018  . IBS (irritable bowel syndrome) 03/19/2018  . Depression 05/17/2017  . AV fistula ([place]) 05/17/2017  . Chronic pain of both knees 05/17/2017  . Arthralgia 05/17/2017  . Osteoarthritis 03/17/2017  . Trigger little finger of right hand 03/17/2017  . Liver disease, unspecified 03/17/2017  . 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 Atrial fibrillation ([place]) 10/30/2016  . Ventral hernia 10/14/2016  . History of renal cell carcinoma 08/04/2016  . Renal transplant failure and rejection 07/31/2016  . Fever 07/17/2016  . Hypogonadism in male 07/25/2015  . 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 Gout 04/24/2015  . Proteinuria 01/03/2015  . Chronic anticoagulation 11/06/2014  . Iron deficiency anemia 11/02/2014  . Epigastric hernia 02/08/2014  . 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[place]) 08/16/2013  . Vitamin D deficiency 08/16/2013  . Spinal stenosis 06/20/2013  . OSA (obstructive sleep apnea)   . Tremor 12/30/2012  . History of kidney transplant 12/15/2012  . Immunosuppressive management encounter following  kidney transplant 12/15/2012  . S/P angioplasty with stent 09/07/2012  . H/O mitral valve replacement with mechanical valve 09/07/2012  . Lumbar radiculopathy 09/06/2012  . ESRD (end stage renal disease) ([place]) 07/26/2012  . Hyperlipidemia 07/26/2012  . S/P AVR (aortic valve replacement) 06/23/2012  . Aortic valve disorder 06/14/2012  . Heart failure ([place]) 05/30/2012  . Thyroid nodule 06/26/2011  . CAD (coronary artery disease) 05/26/2011  . Essential hypertension 05/19/2011    Past Surgical History:  Procedure Laterality Date  . ABDOMINAL AORTOGRAM W/LOWER EXTREMITY N/A 12/22/2019   Procedure: ABDOMINAL AORTOGRAM W/LOWER EXTREMITY;  Surgeon: Marty Heck, MD;  Location: [place];  Service: Cardiovascular;  Laterality: N/A;  . AMPUTATION Right 04/14/2020   Procedure: RIGHT BELOW KNEE AMPUTATION;  Surgeon: Marty Heck, MD;  Location: [place];  Service: Vascular;  Laterality: Right;  . AORTIC VALVE REPLACEMENT  3 /11 /2014   At [facility]    .  CAPD INSERTION N/A 02/19/2017   Procedure: LAPAROSCOPIC INSERTION CONTINUOUS AMBULATORY PERITONEAL DIALYSIS  (CAPD) CATHETER;  Surgeon: Algernon Huxley, MD;  Location: [facility];  Service: General;  Laterality: N/A;  . CORONARY ANGIOPLASTY WITH STENT PLACEMENT    . CORONARY ARTERY BYPASS GRAFT N/A 09/16/2018   Procedure: CORONARY ARTERY BYPASS GRAFT times three      with left internal mammary artery and endoharvest of right leg greater saphenous vein;  Surgeon: Melrose Nakayama, MD;  Location: [place];  Service: Open Heart Surgery;  Laterality: N/A;  . CORONARY STENT INTERVENTION N/A 07/15/2019   Procedure: CORONARY STENT INTERVENTION;  Surgeon: Wellington Hampshire, MD;  Location: [place] [facility];  Service: Cardiovascular;  Laterality: N/A;  . DG AV DIALYSIS  SHUNT ACCESS EXIST*L* OR     left arm  . EPIGASTRIC HERNIA REPAIR N/A 02/19/2017   Procedure: HERNIA REPAIR EPIGASTRIC ADULT;   Surgeon: Robert Bellow, MD;  Location: [facility];  Service: General;  Laterality: N/A;  . EYE SURGERY     bilateral cataract  . HERNIA REPAIR     UMBILICAL HERNIA  . HERNIA REPAIR  02/19/2017  . INNER EAR SURGERY     20 years ago  . KIDNEY TRANSPLANT    . LEFT HEART CATH AND CORS/GRAFTS ANGIOGRAPHY N/A 07/15/2019   Procedure: LEFT HEART CATH AND CORS/GRAFTS ANGIOGRAPHY;  Surgeon: Wellington Hampshire, MD;  Location: [place] [facility];  Service: Cardiovascular;  Laterality: N/A;  . LOWER EXTREMITY ANGIOGRAPHY Right 08/25/2019   Procedure: LOWER EXTREMITY ANGIOGRAPHY;  Surgeon: Algernon Huxley, MD;  Location: [place] [facility];  Service: Cardiovascular;  Laterality: Right;  . LOWER EXTREMITY ANGIOGRAPHY Left 09/29/2019   Procedure: Lower Extremity Angiography;  Surgeon: Algernon Huxley, MD;  Location: [place] [facility];  Service: Cardiovascular;  Laterality: Left;  . PERIPHERAL VASCULAR INTERVENTION Right 12/22/2019   Procedure: PERIPHERAL VASCULAR INTERVENTION;  Surgeon: Marty Heck, MD;  Location: [place] [facility];  Service: Cardiovascular;  Laterality: Right;  . Peritoneal Dialysis access placement  02/19/2017  . PSEUDOANERYSM COMPRESSION N/A 09/29/2019   Procedure: PSEUDOANERYSM COMPRESSION;  Surgeon: Algernon Huxley, MD;  Location: [place] [facility];  Service: Cardiovascular;  Laterality: N/A;  . REMOVAL OF A DIALYSIS CATHETER N/A 11/23/2017   Procedure: REMOVAL OF A PERITONEAL DIALYSIS CATHETER;  Surgeon: Algernon Huxley, MD;  Location: [facility];  Service: Vascular;  Laterality: N/A;  . RIGHT/LEFT HEART CATH AND CORONARY ANGIOGRAPHY Bilateral 06/07/2018   Procedure: RIGHT/LEFT HEART CATH AND CORONARY ANGIOGRAPHY;  Surgeon: Wellington Hampshire, MD;  Location: [place] [facility];  Service: Cardiovascular;  Laterality: Bilateral;  . TEE WITHOUT CARDIOVERSION N/A 07/21/2016   Procedure: TRANSESOPHAGEAL ECHOCARDIOGRAM (TEE);  Surgeon: Wellington Hampshire, MD;  Location: [facility];  Service:  Cardiovascular;  Laterality: N/A;  . TEE WITHOUT CARDIOVERSION N/A 09/16/2018   Procedure: TRANSESOPHAGEAL ECHOCARDIOGRAM (TEE);  Surgeon: Melrose Nakayama, MD;  Location: [place];  Service: Open Heart Surgery;  Laterality: N/A;    Prior to Admission medications   Medication Sig Start Date End Date Taking? Authorizing Provider  acetaminophen (TYLENOL) 500 MG tablet Take 1 tablet (500 mg total) by mouth every 4 (four) hours as needed. 05/03/20   Love, Ivan Anchors, PA-C  allopurinol (ZYLOPRIM) 100 MG tablet Take 1 tablet (100 mg total) by mouth daily. 05/03/20   Love, Ivan Anchors, PA-C  CHOLECALCIFEROL PO Take by mouth. 05/14/20 05/13/21  [provider]  clopidogrel (PLAVIX) 75 MG tablet  Take 1 tablet (75 mg total) by mouth daily with breakfast. 07/29/19   Wellington Hampshire, MD  Epoetin Alfa-epbx (RETACRIT IJ) Epoetin alfa - epbx (Retacrit) 06/04/20 06/03/21  [provider]  ezetimibe (ZETIA) 10 MG tablet TAKE 1 TABLET BY MOUTH EVERY DAY Patient taking differently: Take 10 mg by mouth daily. 03/19/20   Dunn, Areta Haber, PA-C  hydroxychloroquine (PLAQUENIL) 200 MG tablet Take 400 mg by mouth at bedtime. 09/01/19   [provider]  iron sucrose in sodium chloride 0.9 % 100 mL Iron Sucrose (Venofer) 06/13/20 06/05/21  [provider]  lamoTRIgine (LAMICTAL) 25 MG tablet Take 2 tablets (50 mg total) by mouth 2 (two) times daily. 05/03/20   Love, Ivan Anchors, PA-C  lidocaine-prilocaine (EMLA) cream Apply 1 application topically daily as needed (for fistula).    [provider]  loperamide (IMODIUM A-D) 2 MG tablet Take 2 mg by mouth 4 (four) times daily as needed for diarrhea or loose stools.    [provider]  megestrol (MEGACE) 40 MG/ML suspension TAKE 10 ML (400 MG TOTAL) BY MOUTH DAILY. 05/04/20   [provider]  metoprolol tartrate 75 MG TABS Take 75 mg by mouth 2 (two) times daily. 03/13/20 06/11/20  Wellington Hampshire, MD  midodrine (PROAMATINE) 10 MG tablet  TAKE 1 TABLET BY MOUTH 3 TIMES A WEEK AS DIRECTED TAKE 1 TAB BEFORE DIALYSIS MAY REPEAT ONCE AS NEED 06/13/20   [provider]  multivitamin (RENA-VIT) TABS tablet Take 1 tablet by mouth daily. 02/23/19   [provider]  nitroGLYCERIN (NITROSTAT) 0.4 MG SL tablet Place 1 tablet (0.4 mg total) under the tongue every 5 (five) minutes as needed for chest pain. 06/24/19 03/13/20  Loel Dubonnet, NP  oxyCODONE-acetaminophen (PERCOCET/ROXICET) 5-325 MG tablet  03/13/20   [provider]  pantoprazole (PROTONIX) 40 MG tablet Take 1 tablet (40 mg total) by mouth daily. 07/29/19   Wellington Hampshire, MD  polyethylene glycol (MIRALAX / GLYCOLAX) 17 g packet Take 17 g by mouth daily as needed for mild constipation. 05/03/20   Love, Ivan Anchors, PA-C  predniSONE (DELTASONE) 5 MG tablet Take 5 mg by mouth daily. 12/14/19   [provider]  rosuvastatin (CRESTOR) 40 MG tablet Take 1 tablet (40 mg total) by mouth daily at 6 PM. 07/29/19   Wellington Hampshire, MD  sevelamer carbonate (RENVELA) 800 MG tablet Take 3,200 mg by mouth 3 (three) times daily with meals.    [provider]  traZODone (DESYREL) 50 MG tablet TAKE 1/2 -1 TABLET (25-50 MG TOTAL) BY MOUTH AT BEDTIME AS NEEDED FOR SLEEP. Patient taking differently: Take 25-50 mg by mouth at bedtime. 11/10/19   Leone Haven, MD  warfarin (COUMADIN) 1 MG tablet Take 1 to 2 tablets daily as directed by the coumadin clinic 06/11/20   Wellington Hampshire, MD    Allergies Statins, Gabapentin, and Sertraline  Family History  Problem Relation Age of Onset  . Cancer Mother        pancreatic  . Heart disease Father   . Diabetes Father     Social History Social History   Tobacco Use  . Smoking status: Never Smoker  . Smokeless tobacco: Never Used  Vaping Use  . Vaping Use: Never used  Substance Use Topics  . Alcohol use: Not Currently  . Drug use: No    Review of Systems  Review of Systems  Constitutional: Positive  for chills, fatigue and fever.  HENT:  Negative for sore throat.   Respiratory: Negative for shortness of breath.   Cardiovascular: Negative for chest pain.  Gastrointestinal: Negative for abdominal pain.  Genitourinary: Negative for flank pain.  Musculoskeletal: Positive for arthralgias and gait problem. Negative for neck pain.  Skin: Negative for rash and wound.  Allergic/Immunologic: Negative for immunocompromised state.  Neurological: Positive for weakness. Negative for numbness.  Hematological: Does not bruise/bleed easily.  All other systems reviewed and are negative.    ____________________________________________  PHYSICAL EXAM:      VITAL SIGNS: ED Triage Vitals  Enc Vitals Group     BP 07/02/20 1421 115/75     Pulse Rate 07/02/20 1421 (!) 127     Resp 07/02/20 1421 16     Temp 07/02/20 1421 (!) 100.8 F (38.2 C)     Temp Source 07/02/20 1421 Oral     SpO2 07/02/20 1421 99 %     Weight 07/02/20 1424 162 lb 0.6 oz (73.5 kg)     Height 07/02/20 1424 5\' 10"  (1.778 m)     Head Circumference --      Peak Flow --      Pain Score 07/02/20 1423 9     Pain Loc --      Pain Edu? --      Excl. in [place]? --      Physical Exam Vitals and nursing note reviewed.  Constitutional:      General: He is not in acute distress.    Appearance: He is well-developed.  HENT:     Head: Normocephalic and atraumatic.     Mouth/Throat:     Mouth: Mucous membranes are dry.  Eyes:     Conjunctiva/sclera: Conjunctivae normal.  Cardiovascular:     Rate and Rhythm: Regular rhythm. Tachycardia present.     Heart sounds: Normal heart sounds. No murmur heard. No friction rub.  Pulmonary:     Effort: Pulmonary effort is normal. No respiratory distress.     Breath sounds: Normal breath sounds. No wheezing or rales.  Abdominal:     General: There is no distension.     Palpations: Abdomen is soft.     Tenderness: There is no abdominal tenderness.  Musculoskeletal:     Cervical back: Neck  supple.  Skin:    General: Skin is warm.     Capillary Refill: Capillary refill takes less than 2 seconds.  Neurological:     Mental Status: He is alert and oriented to person, place, and time.     Motor: No abnormal muscle tone.      LOWER EXTREMITY EXAM: RIGHT  INSPECTION & PALPATION: Status post BKA.  There is an open but dry wound along the distal aspect of the amputation.  Moderate surrounding erythema, but no overt warmth.  The right knee is markedly swollen and diffusely tender with exquisite pain with any passive range of motion.  No overlying erythema.   ____________________________________________   LABS (all labs ordered are listed, but only abnormal results are displayed)  Labs Reviewed  LACTIC ACID, PLASMA - Abnormal; Notable for the following components:      Result Value   Lactic Acid, Venous 2.0 (*)    All other components within normal limits  LACTIC ACID, PLASMA - Abnormal; Notable for the following components:   Lactic Acid, Venous 2.6 (*)    All other components within normal limits  COMPREHENSIVE METABOLIC PANEL - Abnormal; Notable for the following components:   Chloride 94 (*)  BUN 24 (*)    Creatinine, Ser 4.21 (*)    Calcium 8.7 (*)    Alkaline Phosphatase 127 (*)    GFR, Estimated 15 (*)    All other components within normal limits  CBC WITH DIFFERENTIAL/PLATELET - Abnormal; Notable for the following components:   RBC 3.01 (*)    Hemoglobin 9.6 (*)    HCT 31.4 (*)    MCV 104.3 (*)    RDW 17.3 (*)    Platelets 125 (*)    All other components within normal limits  PROTIME-INR - Abnormal; Notable for the following components:   Prothrombin Time 33.2 (*)    INR 3.4 (*)    All other components within normal limits  SYNOVIAL CELL COUNT + DIFF, W/ CRYSTALS - Abnormal; Notable for the following components:   Color, Synovial YELLOW (*)    Appearance-Synovial TURBID (*)    WBC, Synovial 65,146 (*)    All other components within normal limits   RESP PANEL BY RT-PCR (FLU A&B, COVID) ARPGX2  CULTURE, BLOOD (SINGLE)  CULTURE, BLOOD (SINGLE)  BODY FLUID CULTURE  GRAM STAIN  LACTIC ACID, PLASMA  URINALYSIS, COMPLETE (UACMP) WITH MICROSCOPIC  GLUCOSE, BODY FLUID OTHER  PROTEIN, BODY FLUID (OTHER)    ____________________________________________  EKG: Atrial fibrillation with rapid ventricular response, ventricular rate 114.  QRS 154, QTc 520.  No acute ST elevations or Vesprin EKG evidence of acute ischemia or infarct. ________________________________________  RADIOLOGY All imaging, including plain films, CT scans, and ultrasounds, independently reviewed by me, and interpretations confirmed via formal radiology reads.  ED MD interpretation:   Chest x-ray: Large unremarkable, possible mild edema XR Knee right: Small prepatellar effusion  Official radiology report(s): DG Chest 2 View  Result Date: 07/02/2020 CLINICAL DATA:  Fever, weakness. EXAM: CHEST - 2 VIEW COMPARISON:  Chest x-ray 04/17/2020, CT chest 04/19/2020 FINDINGS: Similar-appearing cardiac silhouette enlargement. The heart size and mediastinal contours are unchanged. Aortic arch calcifications. Redemonstration of aortic and tricuspid valve replacement. Biapical pleural/pulmonary scarring. No focal consolidation. Question Kerley B lines along the left lower lobe. No definite increased interstitial markings. No pleural effusion. No pneumothorax. No acute osseous abnormality.  Old healed right rib fractures. IMPRESSION: Question Kerley B lines along the left lower lobe suggestive of pulmonary edema. Electronically Signed   By: Iven Finn M.D.   On: 07/02/2020 15:25   DG Knee Right Port  Result Date: 07/02/2020 CLINICAL DATA:  Right knee pain and swelling. EXAM: PORTABLE RIGHT KNEE - 1-2 VIEW COMPARISON:  May 14, 2017. FINDINGS: Status post right below-knee amputation. Vascular calcifications are noted. No acute fracture or dislocation is noted. Small  suprapatellar joint effusion is noted. IMPRESSION: Small suprapatellar joint effusion. No acute fracture or dislocation is noted. Electronically Signed   By: Marijo Conception M.D.   On: 07/02/2020 16:29    ____________________________________________  PROCEDURES   Procedure(s) performed (including Critical Care):  .Critical Care Performed by: Duffy Bruce, MD Authorized by: Duffy Bruce, MD   Critical care provider statement:    Critical care time (minutes):  35   Critical care time was exclusive of:  Separately billable procedures and treating other patients and teaching time   Critical care was necessary to treat or prevent imminent or life-threatening deterioration of the following conditions:  Cardiac failure, circulatory failure and sepsis   Critical care was time spent personally by me on the following activities:  Development of treatment plan with patient or surrogate, discussions with consultants, evaluation of patient's response  to treatment, examination of patient, obtaining history from patient or surrogate, ordering and performing treatments and interventions, ordering and review of laboratory studies, ordering and review of radiographic studies, pulse oximetry, re-evaluation of patient's condition and review of old charts   I assumed direction of critical care for this patient from another provider in my specialty: no   .Joint Aspiration/Arthrocentesis  Date/Time: 07/02/2020 6:01 PM Performed by: Duffy Bruce, MD Authorized by: Duffy Bruce, MD   Consent:    Consent obtained:  Verbal   Consent given by:  Patient   Risks, benefits, and alternatives were discussed: yes     Risks discussed:  Bleeding, incomplete drainage, infection, nerve damage and pain Universal protocol:    Imaging studies available: yes     Patient identity confirmed:  Verbally with patient Location:    Location: Right knee. Anesthesia:    Anesthesia method:  Local infiltration   Local  anesthetic:  Bupivacaine 0.25% w/o epi Procedure details:    Preparation: Patient was prepped and draped in usual sterile fashion     Needle gauge:  20 G   Approach: Anterolateral.   Aspirate amount:  50cc   Aspirate characteristics:  Cloudy and bloody   Steroid injected: no   Post-procedure details:    Dressing:  Adhesive bandage   Procedure completion:  Tolerated well, no immediate complications    ____________________________________________  INITIAL IMPRESSION / MDM / ASSESSMENT AND PLAN / ED COURSE  As part of my medical decision making, I reviewed the following data within the [place] notes reviewed and incorporated, Old chart reviewed, Notes from prior ED visits, and [place] Controlled Substance Database       *Jeremy [name] was evaluated in Emergency Department on 07/02/2020 for the symptoms described in the history of present illness. He was evaluated in the context of the global COVID-19 pandemic, which necessitated consideration that the patient might be at risk for infection with the SARS-CoV-2 virus that causes COVID-19. Institutional protocols and algorithms that pertain to the evaluation of patients at risk for COVID-19 are in a state of rapid change based on information released by regulatory bodies including the CDC and federal and state organizations. These policies and algorithms were followed during the patient's care in the ED.  Some ED evaluations and interventions may be delayed as a result of limited staffing during the pandemic.*     Medical Decision Making: 65 year old male here with sepsis, weakness.  Suspect sepsis secondary to septic arthritis of the right knee.  He has no wounds over the knee but did recently fall and hit his chronic wound of his right BKA, which possibly seeded the area.  Arthrocentesis performed after informed consent, which returns grossly infected with marked leukocytosis.  Patient also has lactic acidosis.  He is  end-stage renal so will be very cautious with fluids.  Patient started on broad-spectrum antibiotics.  Dr. Posey Pronto of orthopedics consulted and will take the patient to the operating room.  I also made Dr. Juleen China of nephrology aware.  Lactic acid downtrending after additional fluids here.  Patient is tachycardic but blood pressure is improved with fluids as well.  Chest x-ray unremarkable.  Plain films of the knee show joint effusion but no fracture.  ____________________________________________  FINAL CLINICAL IMPRESSION(S) / ED DIAGNOSES  Final diagnoses:  Pyogenic arthritis of right knee joint, due to unspecified organism ([place])  Sepsis without acute organ dysfunction, due to unspecified organism [facility])     MEDICATIONS GIVEN DURING  THIS VISIT:  Medications  lactated ringers bolus 1,000 mL (0 mLs Intravenous Stopped 07/02/20 1530)  vancomycin (VANCOCIN) IVPB 1000 mg/200 mL premix (0 mg Intravenous Stopped 07/02/20 1734)  cefTRIAXone (ROCEPHIN) 2 g in sodium chloride 0.9 % 100 mL IVPB (0 g Intravenous Stopped 07/02/20 1621)  acetaminophen (TYLENOL) tablet 1,000 mg (1,000 mg Oral Given 07/02/20 1550)  HYDROmorphone (DILAUDID) injection 1 mg (1 mg Intravenous Given 07/02/20 1550)  bupivacaine (PF) (MARCAINE) 0.25 % injection 10 mL (10 mLs Infiltration Given by Other 07/02/20 1600)  sodium chloride 0.9 % bolus 500 mL (0 mLs Intravenous Stopped 07/02/20 1742)     ED Discharge Orders    None       Note:  This document was prepared using Dragon voice recognition software and may include unintentional dictation errors.   Duffy Bruce, MD 07/02/20 [phone]

## 2020-07-02 NOTE — Sepsis Progress Note (Signed)
Secure chat with provider and no additional fluids ordered due to ESRD.

## 2020-07-02 NOTE — Anesthesia Preprocedure Evaluation (Signed)
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 Patient endorses some dry heaves earlier today. Mental status was apparently also declined earlier today but has improved. Patient AOx3 on exam  Reviewed: Allergy & Precautions, NPO status , Patient's Chart, lab work & pertinent test results, Unable to perform ROS - Chart review only  History of Anesthesia Complications Negative for: history of anesthetic complications  Airway Mallampati: II  TM Distance: >3 FB Neck ROM: Full    Dental  (+) Dental Advisory Given, Teeth Intact   Pulmonary sleep apnea and Continuous Positive Airway Pressure Ventilation ,    Pulmonary exam normal        Cardiovascular hypertension, + angina + CAD, + Past MI, + Cardiac Stents, + Peripheral Vascular Disease and +CHF  + dysrhythmias Atrial Fibrillation (-) Valvular Problems/Murmurs Rhythm:Irregular Rate:Tachycardia - Systolic murmurs Echo 10/3708 1. Left ventricular ejection fraction, by estimation, is 45 to 50%. The left ventricle has mildly decreased function. The left ventricle demonstrates regional wall motion abnormalities (see scoring diagram/findings for description). There is moderate left ventricular hypertrophy. Left ventricular diastolic parameters are indeterminate. There is moderate hypokinesis of the left ventricular, basal-mid inferior wall and inferolateral wall.  2. Right ventricular systolic function is normal. The right ventricular size is normal. Tricuspid regurgitation signal is inadequate for assessing PA pressure.  3. Left atrial size was moderately dilated.  4. Right atrial size was mildly dilated.  5. The mitral valve is normal in structure. No evidence of mitral valve regurgitation. No evidence of mitral stenosis.  6. The aortic valve has been repaired/replaced. Aortic valve regurgitation  is not visualized. No aortic stenosis is present. There is a St. Jude valve present in the aortic position. Aortic valve mean gradient measures 11.0 mmHg.   - s/p AVR   Neuro/Psych PSYCHIATRIC DISORDERS Depression negative neurological ROS     GI/Hepatic Neg liver ROS, GERD  Medicated,  Endo/Other  negative endocrine ROS  Renal/GU ESRF and DialysisRenal diseaseLast dialyzed today     Musculoskeletal  (+) Arthritis ,   Abdominal   Peds  Hematology  (+) Blood dyscrasia, anemia ,   Anesthesia Other Findings Past Medical History: 03/19/2018: Abnormal stress test 07/04/2015: Anemia in chronic kidney disease No date: Aortic stenosis     Comment:  a. 2014 s/p mech AVR ([facility])-->chronic coumadin; b. 08/2018              Echo: Mild AS/AI. Nl fxn/gradient. No date: Arthritis No date: Atrial fibrillation ([place]) No date: CAD (coronary artery disease)     Comment:  a. 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 02/2018               [facility]): large inf, infsept, inflat infarct w/ min rev,               EF 30%; c. 06/2018 Cath: LM min irregs, LAD 60p,               95/30/39m, LCX min irregs, RCA 40p, 75m, 90d, RPAV 90,               RPDA 95ost; d. 09/2018 CABGx3 (LIMA->LAD, VG->D1,               VG->PDA). 06/2012: Chronic anticoagulation  Comment:  a. Coumadin in the setting of mech AVR and afib. 03/15/2019: COVID-19 No date: CPAP (continuous positive airway pressure) dependence 04/07/2006: Dialysis patient [facility])     Comment:  Patient started HD around 1996,  had 1st transplant LLQ               around 1998 until 2007 when they found renal cell cancer               in transplant and both native kidneys, all were removed >              went back on HD until 2nd transplant RLQ in 2014 which               lasted 3 yrs; it was embolized for suspected acute               rejection. Is currently on HD MWF in [place] w/ [facility]              nephrology. No date: ESRD (end stage renal disease) ([place])     Comment:  a. 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 1999 s/p               Kidney transplant; c. 2014 Bilat nephrectomy               (transplanted kidney resected) in the setting of renal               cell carcinoma; d. PD until 11/2017-->HD. No date: Heart murmur No date: HFrEF (heart failure with reduced ejection fraction) ([place])     Comment:  a. 07/2016 Echo: EF 55-60%; b. 02/2018 MV: EF 30%; c.               04/2018 Echo: EF 35%; d. 08/2018 Echo: EF 30-35%. Glob HK               w/o rwma. Mildly reduced RV fxn. Mod to sev dil LA. Nl               AoV fxn. No date: Hx of colonoscopy     Comment:  2009 by Dr. Laural Golden. Normal except for small submucosal               lipoma. No evidence of polyps or colitis. No date: Hyperlipidemia No date: Hypertension 07/25/2015: Hypogonadism in male No date: Ischemic cardiomyopathy     Comment:  a. 07/2016 Echo: EF 55-60%; b. 02/2018 MV: EF 30%; c.               04/2018 Echo: EF 35%; d. 08/2018 Echo: EF 30-35%. No date: OSA (obstructive sleep apnea)     Comment:  No CPAP No date: Permanent atrial fibrillation (HCC)     Comment:  a. CHA2DS2VASc = 3-->Coumadin (also mech AVR); b. 06/2018              Amio started 2/2 elevated rates. No date: Renal cell cancer ([place]) 08/04/2016: Renal cell carcinoma (H[facility])   Reproductive/Obstetrics                             Lab Results  Component Value Date   WBC 7.5 07/02/2020   HGB 9.6 (L) 07/02/2020   HCT 31.4 (L) 07/02/2020   MCV 104.3 (H) 07/02/2020   PLT 125 (L) 07/02/2020   Lab Results  Component Value Date   CREATININE 4.21 (  H) 07/02/2020   BUN 24 (H) 07/02/2020   NA 138 07/02/2020   K 4.1 07/02/2020   CL 94 (L) 07/02/2020   CO2 30 07/02/2020   Lab Results  Component Value Date   INR 3.4 (H) 07/02/2020   INR 4.2 (A) 06/26/2020   INR 3.2 (A) 06/20/2020   Echo: 1. The left ventricle has  moderate-severely reduced systolic function, with an ejection fraction of 30-35%. The cavity size was normal. There is mildly increased left ventricular wall thickness. Left ventricular diastolic Doppler parameters are  indeterminate. Left ventrical global hypokinesis without regional wall motion abnormalities.  2. The right ventricle has mildly reduced systolic function. The cavity was mildly enlarged. There is no increase in right ventricular wall thickness.  3. Left atrial size was moderate to severely dilated.  4. A 18mm St. Jude mechanical prosthesis valve is present in the aortic position. Procedure Date: 06/15/12 Normal aortic valve prosthesis.Mean gradient within normal range for prosthetic   Anesthesia Physical  Anesthesia Plan  ASA: IV and emergent  Anesthesia Plan: General   Post-op Pain Management:    Induction: Intravenous and Rapid sequence  PONV Risk Score and Plan: 2 and Ondansetron, Treatment may vary due to age or medical condition and Dexamethasone  Airway Management Planned: Oral ETT  Additional Equipment: None  Intra-op Plan:   Post-operative Plan: Extubation in OR  Informed Consent: I have reviewed the patients History and Physical, chart, labs and discussed the procedure including the risks, benefits and alternatives for the proposed anesthesia with the patient or authorized representative who has indicated his/her understanding and acceptance.     Dental advisory given  Plan Discussed with: CRNA, Anesthesiologist and Surgeon  Anesthesia Plan Comments: (Discussed risks of anesthesia with patient, including PONV, sore throat, lip/dental damage. Rare risks discussed as well, such as cardiorespiratory and neurological sequelae. Patient understands. Patient counseled on being higher risk for anesthesia due to comorbidities: active infection, ESRD. Patient was told about increased risk of cardiac and respiratory events, including death. Patient understands. )         Anesthesia Quick Evaluation

## 2020-07-03 ENCOUNTER — Ambulatory Visit: Payer: Medicare Other

## 2020-07-03 ENCOUNTER — Encounter: Payer: Self-pay | Admitting: Orthopedic Surgery

## 2020-07-03 ENCOUNTER — Other Ambulatory Visit: Payer: Self-pay

## 2020-07-03 DIAGNOSIS — T8789 Other complications of amputation stump: Secondary | ICD-10-CM

## 2020-07-03 DIAGNOSIS — I998 Other disorder of circulatory system: Secondary | ICD-10-CM

## 2020-07-03 DIAGNOSIS — M009 Pyogenic arthritis, unspecified: Secondary | ICD-10-CM

## 2020-07-03 DIAGNOSIS — D539 Nutritional anemia, unspecified: Secondary | ICD-10-CM

## 2020-07-03 LAB — CBC
HCT: 30.2 % — ABNORMAL LOW (ref 39.0–52.0)
Hemoglobin: 9.2 g/dL — ABNORMAL LOW (ref 13.0–17.0)
MCH: 33.1 pg (ref 26.0–34.0)
MCHC: 30.5 g/dL (ref 30.0–36.0)
MCV: 108.6 fL — ABNORMAL HIGH (ref 80.0–100.0)
Platelets: 95 10*3/uL — ABNORMAL LOW (ref 150–400)
RBC: 2.78 MIL/uL — ABNORMAL LOW (ref 4.22–5.81)
RDW: 17.6 % — ABNORMAL HIGH (ref 11.5–15.5)
WBC: 6.3 10*3/uL (ref 4.0–10.5)
nRBC: 0 % (ref 0.0–0.2)

## 2020-07-03 LAB — COMPREHENSIVE METABOLIC PANEL
ALT: 14 U/L (ref 0–44)
AST: 20 U/L (ref 15–41)
Albumin: 2.9 g/dL — ABNORMAL LOW (ref 3.5–5.0)
Alkaline Phosphatase: 97 U/L (ref 38–126)
Anion gap: 12 (ref 5–15)
BUN: 36 mg/dL — ABNORMAL HIGH (ref 8–23)
CO2: 28 mmol/L (ref 22–32)
Calcium: 9 mg/dL (ref 8.9–10.3)
Chloride: 94 mmol/L — ABNORMAL LOW (ref 98–111)
Creatinine, Ser: 5.56 mg/dL — ABNORMAL HIGH (ref 0.61–1.24)
GFR, Estimated: 11 mL/min — ABNORMAL LOW (ref >60)
Glucose, Bld: 88 mg/dL (ref 70–99)
Potassium: 5.6 mmol/L — ABNORMAL HIGH (ref 3.5–5.1)
Sodium: 134 mmol/L — ABNORMAL LOW (ref 135–145)
Total Bilirubin: 0.8 mg/dL (ref 0.3–1.2)
Total Protein: 6.5 g/dL (ref 6.5–8.1)

## 2020-07-03 LAB — PROCALCITONIN: Procalcitonin: 2.39 ng/mL

## 2020-07-03 LAB — CORTISOL-AM, BLOOD: Cortisol - AM: 13.6 ug/dL (ref 6.7–22.6)

## 2020-07-03 LAB — PROTIME-INR
INR: 3.6 — ABNORMAL HIGH (ref 0.8–1.2)
Prothrombin Time: 34.9 seconds — ABNORMAL HIGH (ref 11.4–15.2)

## 2020-07-03 LAB — GLUCOSE, BODY FLUID OTHER: Glucose, Body Fluid Other: 4 mg/dL

## 2020-07-03 LAB — PROTEIN, BODY FLUID (OTHER): Total Protein, Body Fluid Other: 4.3 g/dL

## 2020-07-03 MED ORDER — SEVELAMER CARBONATE 800 MG PO TABS: 800.0000 mg | ORAL_TABLET | Freq: Three times a day (TID) | ORAL | Status: DC

## 2020-07-03 MED ORDER — HYDROMORPHONE HCL 1 MG/ML IJ SOLN
INTRAMUSCULAR | Status: AC
  Filled 2020-07-03: qty 1

## 2020-07-03 MED ORDER — ACETAMINOPHEN 325 MG PO TABS: 650.0000 mg | ORAL_TABLET | Freq: Four times a day (QID) | ORAL | Status: DC | PRN

## 2020-07-03 MED ORDER — ONDANSETRON HCL 4 MG/2ML IJ SOLN: 4.0000 mg | Freq: Four times a day (QID) | INTRAMUSCULAR | Status: DC | PRN

## 2020-07-03 MED ORDER — ACETAMINOPHEN 325 MG PO TABS
650.0000 mg | ORAL_TABLET | Freq: Four times a day (QID) | ORAL | Status: DC | PRN
  Administered 2020-07-06 – 2020-07-07 (×3): 650 mg via ORAL
  Filled 2020-07-03 (×3): qty 2

## 2020-07-03 MED ORDER — FENTANYL CITRATE (PF) 100 MCG/2ML IJ SOLN
INTRAMUSCULAR | Status: AC
  Filled 2020-07-03: qty 2

## 2020-07-03 MED ORDER — CHLORHEXIDINE GLUCONATE CLOTH 2 % EX PADS
6.0000 | MEDICATED_PAD | Freq: Every day | CUTANEOUS | Status: DC
  Administered 2020-07-04 – 2020-07-08 (×5): 6 via TOPICAL

## 2020-07-03 MED ORDER — METOPROLOL TARTRATE 50 MG PO TABS
50.0000 mg | ORAL_TABLET | Freq: Two times a day (BID) | ORAL | Status: DC
  Administered 2020-07-03 – 2020-07-05 (×5): 50 mg via ORAL
  Filled 2020-07-03 (×5): qty 1

## 2020-07-03 MED ORDER — OXYCODONE HCL 5 MG PO TABS
ORAL_TABLET | ORAL | Status: AC
  Administered 2020-07-03: 5 mg via ORAL
  Filled 2020-07-03: qty 1

## 2020-07-03 MED ORDER — SEVELAMER CARBONATE 800 MG PO TABS
800.0000 mg | ORAL_TABLET | Freq: Three times a day (TID) | ORAL | Status: DC
Start: 2020-07-03 — End: 2020-07-07
  Administered 2020-07-03 – 2020-07-07 (×10): 800 mg via ORAL
  Filled 2020-07-03 (×10): qty 1

## 2020-07-03 MED ORDER — ACETAMINOPHEN 650 MG RE SUPP: 650.0000 mg | Freq: Four times a day (QID) | RECTAL | Status: DC | PRN

## 2020-07-03 MED ORDER — EPOETIN ALFA 10000 UNIT/ML IJ SOLN
10000.0000 [IU] | INTRAMUSCULAR | Status: DC
  Administered 2020-07-04 – 2020-07-06 (×2): 10000 [IU] via INTRAVENOUS

## 2020-07-03 MED ORDER — SODIUM ZIRCONIUM CYCLOSILICATE 10 G PO PACK
10.0000 g | PACK | Freq: Once | ORAL | Status: DC
  Filled 2020-07-03: qty 1

## 2020-07-03 MED ORDER — SODIUM CHLORIDE 0.9 % IV SOLN
2.0000 g | INTRAVENOUS | Status: DC
  Administered 2020-07-03 – 2020-07-06 (×4): 2 g via INTRAVENOUS
  Filled 2020-07-03 (×2): qty 20
  Filled 2020-07-03: qty 2
  Filled 2020-07-03: qty 20
  Filled 2020-07-03: qty 2
  Filled 2020-07-03: qty 20

## 2020-07-03 MED ORDER — FENTANYL CITRATE (PF) 100 MCG/2ML IJ SOLN
INTRAMUSCULAR | Status: AC
  Administered 2020-07-03: 50 ug via INTRAVENOUS
  Filled 2020-07-03: qty 2

## 2020-07-03 MED ORDER — VANCOMYCIN HCL IN DEXTROSE 1-5 GM/200ML-% IV SOLN: 1000.0000 mg | Freq: Once | INTRAVENOUS | Status: DC

## 2020-07-03 MED ORDER — OXYCODONE-ACETAMINOPHEN 5-325 MG PO TABS
1.0000 | ORAL_TABLET | Freq: Four times a day (QID) | ORAL | Status: DC | PRN
  Administered 2020-07-04 – 2020-07-06 (×4): 1 via ORAL
  Filled 2020-07-03 (×4): qty 1

## 2020-07-03 MED ORDER — TRAZODONE HCL 50 MG PO TABS
25.0000 mg | ORAL_TABLET | Freq: Every day | ORAL | Status: DC
  Administered 2020-07-03 – 2020-07-07 (×5): 50 mg via ORAL
  Filled 2020-07-03 (×5): qty 1

## 2020-07-03 MED ORDER — ONDANSETRON HCL 4 MG PO TABS: 4.0000 mg | ORAL_TABLET | Freq: Four times a day (QID) | ORAL | Status: DC | PRN

## 2020-07-03 MED ORDER — MORPHINE SULFATE (PF) 4 MG/ML IV SOLN
4.0000 mg | INTRAVENOUS | Status: DC | PRN
  Administered 2020-07-03 – 2020-07-05 (×7): 4 mg via INTRAVENOUS
  Filled 2020-07-03 (×8): qty 1

## 2020-07-03 MED ORDER — MORPHINE SULFATE (PF) 4 MG/ML IV SOLN
INTRAVENOUS | Status: AC
  Administered 2020-07-03: 4 mg via INTRAVENOUS
  Filled 2020-07-03: qty 1

## 2020-07-03 MED ORDER — ACETAMINOPHEN 650 MG RE SUPP
650.0000 mg | Freq: Four times a day (QID) | RECTAL | Status: DC | PRN
  Filled 2020-07-03: qty 1

## 2020-07-03 NOTE — OR Nursing (Signed)
Placed on PACU hold, waiting on room assignment and rooms to be cleaned.

## 2020-07-03 NOTE — Consult Note (Signed)
NAME: Jeremy [name]  DOB: Apr 18, 1955  MRN: [id number]  Date/Time: 07/03/2020 2:23 PM  REQUESTING PROVIDER: Dr. Posey Pronto Subjective:  REASON FOR CONSULT: Left knee septic arthritis ? Jeremy [name] is a 65 y.o. male with a history of ESRD, failed renal transplant, aortic valve replacement, peripheral vascular disease, below-knee amputation, right BKA anemia of chronic disease   presented to the ED on 07/02/2020 with right knee swelling.  He was also complaining of feeling weak and was having shaking chills.  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  The stump was slow to heal and there was significant eschar noted at the last vascular visit.  He was on Coumadin and Plavix. In the ED vitals BP 115/75, temperature 96.8, pulse 113, sats 100% on 6 L oxygen.  WBC 7.5, Hb 9.6, platelet 125 and creatinine was 4.21.  Blood cultures were sent.  He was noted to have painful swelling of the right knee joint.  An attempted motion of the knee was extremely painful.  In the ED right knee joint was aspirated.  Cell count was 65,000 WBC with 98% polymorphs negative for crystals.  He was started on vancomycin and ceftriaxone. He was seen by orthopedic surgeon and underwent right knee arthroscopic irrigation and debridement.  Also right knee partial lateral meniscectomy was done.  And right knee synovectomy was conducted.  Multiple cultures were sent.  I am asked to see the patient for management of the right knee infection. He last saw vascular surgery on 06/27/2020 and during that visit it was noted that he had fallen on his stump.  A picture that was taken during that visit showed eschar at the stump site which was similar to the pictures taken in February 2022.Marland Kitchen         Past Medical History:  Diagnosis Date  . Abnormal stress test 03/19/2018  . 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 Aortic stenosis    a. 2014 s/p mech AVR  ([facility])-->chronic coumadin; b. 08/2018 Echo: Mild AS/AI. Nl fxn/gradient.  . Arthritis   . Atrial fibrillation ([place])   . CAD (coronary artery disease)    a. 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 02/2018 MV [facility]): large inf, infsept, inflat infarct w/ min rev, EF 30%; c. 06/2018 Cath: LM min irregs, LAD 60p, 95/30/6m, LCX min irregs, RCA 40p, 15m, 90d, RPAV 90, RPDA 95ost; d. 09/2018 CABGx3 (LIMA->LAD, VG->D1, VG->PDA).  . Chronic anticoagulation 06/2012   a. Coumadin in the setting of mech AVR and afib.  Marland Kitchen COVID-19 03/15/2019  . CPAP (continuous positive airway pressure) dependence   . Dialysis patient [facility]) 04/07/2006   Patient started HD around 1996,  had 1st transplant LLQ around 1998 until 2007 when they found renal cell cancer in transplant and both native kidneys, all were removed > went back on HD until 2nd transplant RLQ in 2014 which lasted 3 yrs; it was embolized for suspected acute rejection. Is currently on HD MWF in [place] w/ [facility] nephrology.  . ESRD (end stage renal disease) ([place])    a. 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 1999 s/p Kidney transplant; c. 2014 Bilat nephrectomy (transplanted kidney resected) in the setting of renal cell carcinoma; d. PD until 11/2017-->HD.  Marland Kitchen Heart murmur   . HFrEF (heart failure with reduced ejection fraction) ([place])  a. 07/2016 Echo: EF 55-60%; b. 02/2018 MV: EF 30%; c. 04/2018 Echo: EF 35%; d. 08/2018 Echo: EF 30-35%. Glob HK w/o rwma. Mildly reduced RV fxn. Mod to sev dil LA. Nl AoV fxn.  Marland Kitchen Hx of colonoscopy    2009 by Dr. Laural Golden. Normal except for small submucosal lipoma. No evidence of polyps or colitis.  . Hyperlipidemia   . Hypertension   . Hypogonadism in male 07/25/2015  . Ischemic cardiomyopathy    a. 07/2016 Echo: EF 55-60%; b. 02/2018 MV: EF 30%; c. 04/2018 Echo: EF 35%; d. 08/2018 Echo: EF 30-35%.  . OSA (obstructive sleep apnea)    No CPAP  . Permanent atrial  fibrillation ([facility])    a. CHA2DS2VASc = 3-->Coumadin (also mech AVR); b. 06/2018 Amio started 2/2 elevated rates.  . Renal cell cancer ([place])   . Renal cell carcinoma ([place]) 08/04/2016    Past Surgical History:  Procedure Laterality Date  . ABDOMINAL AORTOGRAM W/LOWER EXTREMITY N/A 12/22/2019   Procedure: ABDOMINAL AORTOGRAM W/LOWER EXTREMITY;  Surgeon: Marty Heck, MD;  Location: [place] [facility];  Service: Cardiovascular;  Laterality: N/A;  . AMPUTATION Right 04/14/2020   Procedure: RIGHT BELOW KNEE AMPUTATION;  Surgeon: Marty Heck, MD;  Location: [place];  Service: Vascular;  Laterality: Right;  . AORTIC VALVE REPLACEMENT  3 /11 /2014   At [place]    . CAPD INSERTION N/A 02/19/2017   Procedure: LAPAROSCOPIC INSERTION CONTINUOUS AMBULATORY PERITONEAL DIALYSIS  (CAPD) CATHETER;  Surgeon: Algernon Huxley, MD;  Location: [facility];  Service: General;  Laterality: N/A;  . CORONARY ANGIOPLASTY WITH STENT PLACEMENT    . CORONARY ARTERY BYPASS GRAFT N/A 09/16/2018   Procedure: CORONARY ARTERY BYPASS GRAFT times three      with left internal mammary artery and endoharvest of right leg greater saphenous vein;  Surgeon: Melrose Nakayama, MD;  Location: [place];  Service: Open Heart Surgery;  Laterality: N/A;  . CORONARY STENT INTERVENTION N/A 07/15/2019   Procedure: CORONARY STENT INTERVENTION;  Surgeon: Wellington Hampshire, MD;  Location: [place] [facility];  Service: Cardiovascular;  Laterality: N/A;  . DG AV DIALYSIS  SHUNT ACCESS EXIST*L* OR     left arm  . EPIGASTRIC HERNIA REPAIR N/A 02/19/2017   Procedure: HERNIA REPAIR EPIGASTRIC ADULT;  Surgeon: Robert Bellow, MD;  Location: [facility];  Service: General;  Laterality: N/A;  . EYE SURGERY     bilateral cataract  . HERNIA REPAIR     UMBILICAL HERNIA  . HERNIA REPAIR  02/19/2017  . INNER EAR SURGERY     20 years ago  . KIDNEY TRANSPLANT    . KNEE ARTHROSCOPY WITH MEDIAL  MENISECTOMY Right 07/02/2020   Procedure: KNEE ARTHROSCOPY WITH INCISION AND DRAINAGE;  Surgeon: Leim Fabry, MD;  Location: [facility];  Service: Orthopedics;  Laterality: Right;  . LEFT HEART CATH AND CORS/GRAFTS ANGIOGRAPHY N/A 07/15/2019   Procedure: LEFT HEART CATH AND CORS/GRAFTS ANGIOGRAPHY;  Surgeon: Wellington Hampshire, MD;  Location: [place] [facility];  Service: Cardiovascular;  Laterality: N/A;  . LOWER EXTREMITY ANGIOGRAPHY Right 08/25/2019   Procedure: LOWER EXTREMITY ANGIOGRAPHY;  Surgeon: Algernon Huxley, MD;  Location: [place] [facility];  Service: Cardiovascular;  Laterality: Right;  . LOWER EXTREMITY ANGIOGRAPHY Left 09/29/2019   Procedure: Lower Extremity Angiography;  Surgeon: Algernon Huxley, MD;  Location: [place] [facility];  Service: Cardiovascular;  Laterality: Left;  . PERIPHERAL VASCULAR INTERVENTION Right  12/22/2019   Procedure: PERIPHERAL VASCULAR INTERVENTION;  Surgeon: Marty Heck, MD;  Location: [place] [facility];  Service: Cardiovascular;  Laterality: Right;  . Peritoneal Dialysis access placement  02/19/2017  . PSEUDOANERYSM COMPRESSION N/A 09/29/2019   Procedure: PSEUDOANERYSM COMPRESSION;  Surgeon: Algernon Huxley, MD;  Location: [place] [facility];  Service: Cardiovascular;  Laterality: N/A;  . REMOVAL OF A DIALYSIS CATHETER N/A 11/23/2017   Procedure: REMOVAL OF A PERITONEAL DIALYSIS CATHETER;  Surgeon: Algernon Huxley, MD;  Location: [facility];  Service: Vascular;  Laterality: N/A;  . RIGHT/LEFT HEART CATH AND CORONARY ANGIOGRAPHY Bilateral 06/07/2018   Procedure: RIGHT/LEFT HEART CATH AND CORONARY ANGIOGRAPHY;  Surgeon: Wellington Hampshire, MD;  Location: [place] [facility];  Service: Cardiovascular;  Laterality: Bilateral;  . TEE WITHOUT CARDIOVERSION N/A 07/21/2016   Procedure: TRANSESOPHAGEAL ECHOCARDIOGRAM (TEE);  Surgeon: Wellington Hampshire, MD;  Location: [facility];  Service: Cardiovascular;  Laterality: N/A;  . TEE WITHOUT CARDIOVERSION N/A 09/16/2018   Procedure:  TRANSESOPHAGEAL ECHOCARDIOGRAM (TEE);  Surgeon: Melrose Nakayama, MD;  Location: [place];  Service: Open Heart Surgery;  Laterality: N/A;    Social History   Socioeconomic History  . Marital status: Divorced    Spouse name: Not on file  . Number of children: 1  . Years of education: 42  . Highest education level: High school graduate  Occupational History  . Occupation: retired Forensic scientist -Disabled    Comment: Psychologist, educational  Tobacco Use  . Smoking status: Never Smoker  . Smokeless tobacco: Never Used  Vaping Use  . Vaping Use: Never used  Substance and Sexual Activity  . Alcohol use: Not Currently  . Drug use: No  . Sexual activity: Not Currently    Partners: Female  Other Topics Concern  . Not on file  Social History Narrative   Lives at home with girlfriend   1 daughter 37    Right handed    Social Determinants of Health   Financial Resource Strain: Not on file  Food Insecurity: Not on file  Transportation Needs: Not on file  Physical Activity: Not on file  Stress: Not on file  Social Connections: Not on file  Intimate Partner Violence: Not on file    Family History  Problem Relation Age of Onset  . Cancer Mother        pancreatic  . Heart disease Father   . Diabetes Father    Allergies  Allergen Reactions  . Statins Other (See Comments)    Arthralgias (intolerance), Myalgias (intolerance)  Pt taking pravastatin   . Gabapentin     Drowsy   . Sertraline Rash   I? Current Facility-Administered Medications  Medication Dose Route Frequency Provider Last Rate Last Admin  . acetaminophen (TYLENOL) tablet 650 mg  650 mg Oral Q6H PRN Oswald Hillock, RPH       Or  . acetaminophen (TYLENOL) suppository 650 mg  650 mg Rectal Q6H PRN Oswald Hillock, RPH      . cefTRIAXone (ROCEPHIN) 2 g in sodium chloride 0.9 % 100 mL IVPB  2 g Intravenous Q24H Gala Romney L, MD      . Derrill Memo ON 07/04/2020] epoetin alfa (EPOGEN) injection 10,000 Units  10,000 Units  Intravenous Q M,W,F-HD Breeze, Shantelle, NP      . metoprolol tartrate (LOPRESSOR) tablet 50 mg  50 mg Oral BID Wyvonnia Dusky, MD   50 mg at 07/03/20 1136  . morphine 4 MG/ML injection 4 mg  4 mg Intravenous Q2H PRN Elwyn Reach, MD   4 mg at 07/03/20 1136  . ondansetron (ZOFRAN) tablet 4 mg  4 mg Oral Q6H PRN Elwyn Reach, MD       Or  . ondansetron (ZOFRAN) injection 4 mg  4 mg Intravenous Q6H PRN Elwyn Reach, MD      . Derrill Memo ON 07/04/2020] vancomycin (VANCOREADY) IVPB 750 mg/150 mL  750 mg Intravenous Q M,W,F-HD Elwyn Reach, MD         Abtx:  Anti-infectives (From admission, onward)   Start     Dose/Rate Route Frequency Ordered Stop   07/04/20 1200  vancomycin (VANCOREADY) IVPB 750 mg/150 mL        750 mg 150 mL/hr over 60 Minutes Intravenous Every M-W-F (Hemodialysis) 07/02/20 2348     07/03/20 1600  cefTRIAXone (ROCEPHIN) 2 g in sodium chloride 0.9 % 100 mL IVPB        2 g 200 mL/hr over 30 Minutes Intravenous Every 24 hours 07/03/20 0442     07/03/20 0445  vancomycin (VANCOCIN) IVPB 1000 mg/200 mL premix  Status:  Discontinued        1,000 mg 200 mL/hr over 60 Minutes Intravenous  Once 07/03/20 0442 07/03/20 0445   07/02/20 2345  vancomycin (VANCOREADY) IVPB 750 mg/150 mL        750 mg 150 mL/hr over 60 Minutes Intravenous  Once 07/02/20 2336 07/03/20 0051   07/02/20 1530  cefTRIAXone (ROCEPHIN) 2 g in sodium chloride 0.9 % 100 mL IVPB        2 g 200 mL/hr over 30 Minutes Intravenous  Once 07/02/20 1515 07/02/20 1621   07/02/20 1515  vancomycin (VANCOCIN) IVPB 1000 mg/200 mL premix        1,000 mg 200 mL/hr over 60 Minutes Intravenous  Once 07/02/20 1515 07/02/20 1734      REVIEW OF SYSTEMS:  Const:no fever,  Positive chills,  weight loss Eyes: negative diplopia or visual changes, negative eye pain ENT: negative coryza, negative sore throat Resp: negative cough, hemoptysis, dyspnea Cards: negative for chest pain, palpitations, lower extremity  edema GU: negative for frequency, dysuria and hematuria GI: Negative for abdominal pain, diarrhea, bleeding, constipation Skin: negative for rash and pruritus Heme: negative for easy bruising and gum/nose bleeding MS: fatigue , weakness, swelling rt knee Neurolo:negative for headaches, dizziness, vertigo, memory problems  Psych: negative for feelings of anxiety, depression  Endocrine: negative for thyroid, diabetes Allergy/Immunology- as above?  Objective:  VITALS:  BP 124/75 (BP Location: Right Arm)   Pulse (!) 118   Temp 98.5 F (36.9 C) (Oral)   Resp 16   Ht 5\' 10"  (1.778 m)   Wt 73.7 kg   SpO2 100%   BMI 23.30 kg/m  PHYSICAL EXAM:  General: Alert, cooperative, no distress, pale, chronically ill Head: Normocephalic, without obvious abnormality, atraumatic. Eyes: Conjunctivae clear, anicteric sclerae. Pupils are equal ENT Nares normal. No drainage or sinus tenderness. Lips, mucosa, and tongue normal. No Thrush Neck: Supple, symmetrical, no adenopathy, thyroid: non tender no carotid bruit and no JVD. Back: No CVA tenderness. Lungs: Clear to auscultation bilaterally. No Wheezing or Rhonchi. No rales. Heart: sternal scar Regular rate and rhythm, no murmur, rub or gallop.prosthetic valve sound Abdomen: Soft, non-tender,not distended. Bowel sounds normal. No masses, surgical scara Extremities: left forearm fistula Rt BKA stump -eschar- some oozing    Skin: easy bruising Lymph: Cervical, supraclavicular normal. Neurologic: Grossly non-focal Pertinent Labs Lab Results CBC  Component Value Date/Time   WBC 6.3 07/03/2020 0632   RBC 2.78 (L) 07/03/2020 0632   HGB 9.2 (L) 07/03/2020 0632   HGB 10.0 (L) 08/30/2014 1013   HCT 30.2 (L) 07/03/2020 0632   HCT 32.2 (L) 08/30/2014 1013   PLT 95 (L) 07/03/2020 0632   PLT 125 (L) 08/30/2014 1013   MCV 108.6 (H) 07/03/2020 0632   MCV 88.2 09/16/2014 1041   MCV 89 08/30/2014 1013   MCH 33.1 07/03/2020 0632   MCHC 30.5  07/03/2020 0632   RDW 17.6 (H) 07/03/2020 0632   RDW 14.0 08/30/2014 1013   LYMPHSABS 1.3 07/02/2020 1429   LYMPHSABS 0.8 08/30/2014 1013   MONOABS 0.7 07/02/2020 1429   EOSABS 0.0 07/02/2020 1429   EOSABS 0.1 08/30/2014 1013   BASOSABS 0.0 07/02/2020 1429   BASOSABS 0.0 08/30/2014 1013    CMP Latest Ref Rng & Units 07/03/2020 07/02/2020 05/03/2020  Glucose 70 - 99 mg/dL 88 95 98  BUN 8 - 23 mg/dL 36(H) 24(H) 66(H)  Creatinine 0.61 - 1.24 mg/dL 5.56(H) 4.21(H) 10.35(H)  Sodium 135 - 145 mmol/L 134(L) 138 132(L)  Potassium 3.5 - 5.1 mmol/L 5.6(H) 4.1 4.0  Chloride 98 - 111 mmol/L 94(L) 94(L) 93(L)  CO2 22 - 32 mmol/L 28 30 22   Calcium 8.9 - 10.3 mg/dL 9.0 8.7(L) 10.3  Total Protein 6.5 - 8.1 g/dL 6.5 7.6 -  Total Bilirubin 0.3 - 1.2 mg/dL 0.8 1.0 -  Alkaline Phos 38 - 126 U/L 97 127(H) -  AST 15 - 41 U/L 20 25 -  ALT 0 - 44 U/L 14 18 -      Microbiology: Recent Results (from the past 240 hour(s))  Blood culture (single)     Status: None (Preliminary result)   Collection Time: 07/02/20  2:36 PM   Specimen: BLOOD  Result Value Ref Range Status   Specimen Description BLOOD BLOOD RIGHT FOREARM  Final   Special Requests   Final    BOTTLES DRAWN AEROBIC AND ANAEROBIC Blood Culture adequate volume   Culture   Final    NO GROWTH < 24 HOURS Performed at [facility], [address] [place], [place] [address]    Report Status PENDING  Incomplete  Blood culture (single)     Status: None (Preliminary result)   Collection Time: 07/02/20  3:37 PM   Specimen: BLOOD  Result Value Ref Range Status   Specimen Description BLOOD RIGHT ANTECUBITAL  Final   Special Requests   Final    BOTTLES DRAWN AEROBIC AND ANAEROBIC Blood Culture adequate volume   Culture   Final    NO GROWTH < 24 HOURS Performed at [facility], [address] [place], [place] [address]    Report Status PENDING  Incomplete  Resp Panel by RT-PCR (Flu A&B, Covid) Nasopharyngeal Swab     Status:  None   Collection Time: 07/02/20  3:37 PM   Specimen: Nasopharyngeal Swab; Nasopharyngeal(NP) swabs in vial transport medium  Result Value Ref Range Status   SARS Coronavirus 2 by RT PCR NEGATIVE NEGATIVE Final    Comment: (NOTE) SARS-CoV-2 target nucleic acids are NOT DETECTED.  The SARS-CoV-2 RNA is generally detectable in upper respiratory specimens during the acute phase of infection. The lowest concentration of SARS-CoV-2 viral copies this assay can detect is 138 copies/mL. A negative result does not preclude SARS-Cov-2 infection and should not be used as the sole basis for treatment or other patient management decisions. A negative result may occur  with  improper specimen collection/handling, submission of specimen other than nasopharyngeal swab, presence of viral mutation(s) within the areas targeted by this assay, and inadequate number of viral copies(<138 copies/mL). A negative result must be combined with clinical observations, patient history, and epidemiological information. The expected result is Negative.  Fact Sheet for Patients:  EntrepreneurPulse.com.au  Fact Sheet for Healthcare Providers:  IncredibleEmployment.be  This test is no t yet approved or cleared by the [place] FDA and  has been authorized for detection and/or diagnosis of SARS-CoV-2 by FDA under an Emergency Use Authorization (EUA). This EUA will remain  in effect (meaning this test can be used) for the duration of the COVID-19 declaration under Section 564(b)(1) of the Act, 21 U.S.C.section 360bbb-3(b)(1), unless the authorization is terminated  or revoked sooner.       Influenza A by PCR NEGATIVE NEGATIVE Final   Influenza B by PCR NEGATIVE NEGATIVE Final    Comment: (NOTE) The Xpert Xpress SARS-CoV-2/FLU/RSV plus assay is intended as an aid in the diagnosis of influenza from Nasopharyngeal swab specimens and should not be used as a sole basis for  treatment. Nasal washings and aspirates are unacceptable for Xpert Xpress SARS-CoV-2/FLU/RSV testing.  Fact Sheet for Patients: EntrepreneurPulse.com.au  Fact Sheet for Healthcare Providers: IncredibleEmployment.be  This test is not yet approved or cleared by the [place] FDA and has been authorized for detection and/or diagnosis of SARS-CoV-2 by FDA under an Emergency Use Authorization (EUA). This EUA will remain in effect (meaning this test can be used) for the duration of the COVID-19 declaration under Section 564(b)(1) of the Act, 21 U.S.C. section 360bbb-3(b)(1), unless the authorization is terminated or revoked.  Performed at [facility], [address] [place], [place] [address]   Aerobic/Anaerobic Culture w Gram Stain (surgical/deep wound)     Status: None (Preliminary result)   Collection Time: 07/02/20  9:50 PM   Specimen: PATH Other; Body Fluid  Result Value Ref Range Status   Specimen Description KNEE RIGHT JOINT  Final   Special Requests   Final    ID 1 PATIENT ON FOLLOWING VANC,ROCEPHIN SWAB Performed at [facility], [place] [address] [place], [name] [address]    Gram Stain PENDING  Incomplete   Culture PENDING  Incomplete   Report Status PENDING  Incomplete  Aerobic/Anaerobic Culture w Gram Stain (surgical/deep wound)     Status: None (Preliminary result)   Collection Time: 07/02/20  9:51 PM   Specimen: PATH Other; Body Fluid  Result Value Ref Range Status   Specimen Description KNEE RIGHT JOINT  Final   Special Requests   Final    ID 2 SWAB Performed at [facility], [place] [address] [place], [place] [address]    Gram Stain PENDING  Incomplete   Culture PENDING  Incomplete   Report Status PENDING  Incomplete    IMAGING RESULTS: Xray knee Small suprapatellar joint effusion. No acute fracture or dislocation is noted. CXr Biapical pleural/pulmonary scarring. No focal consolidation.Question  Kerley B lines along the left lower lobe. No definite increased interstitial markings. No pleural effusion. No pneumothorax. I have personally reviewed the films ? Impression/Recommendation ? ?Right knee septic arthritis.  Status post I&D and synovectomy Currently on Vanco and ceftriaxone Awaiting cultures from the surgery.  Recent right BKA on 04/14/2020. Stump still has residual vascular insufficiency with an eschar.  Will need debridement/revision of the stump as the rt knee infection may not clear   Severe peripheral vascular disease  Aortic valve replacement in 2014  Coronary artery disease  End-stage renal disease.  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 ? ___________________________________________________ Discussed with patient, requesting provider Note:  This document was prepared using Dragon voice recognition software and may include unintentional dictation errors.

## 2020-07-03 NOTE — Progress Notes (Signed)
[place], [name] 07/03/20  Subjective:   LOS: 1 Jeremy [name] is a 65 y.o. male with past medical history of ESRD on HD, s/p failed kidney transplant, CAD, A fib, obstructive sleep apnea on CPAP, and PVD with Rt BKA. He presents to the ED with right knee swelling and pain.   He is admitted for further workup of right knee pain  Patient says he received dialysis yesterday from outpatient clinic. Dialysis schedule of MWF at [address]. Denies shortness of breath, nausea, and diarrhea.   Last HD was 07/02/20  Objective:  Vital signs in last 24 hours:  Temp:  [96.8 F (36 C)-100.8 F (38.2 C)] 98.5 F (36.9 C) (03/29 1135) Pulse Rate:  [110-129] 118 (03/29 1135) Resp:  [7-28] 16 (03/29 1135) BP: (86-146)/(55-101) 124/75 (03/29 1135) SpO2:  [94 %-100 %] 100 % (03/29 1135) Weight:  [73.5 kg-73.7 kg] 73.7 kg (03/29 0622)  Weight change:  Filed Weights   07/02/20 1424 07/03/20 0622  Weight: 73.5 kg 73.7 kg    Intake/Output:    Intake/Output Summary (Last 24 hours) at 07/03/2020 1339 Last data filed at 07/03/2020 1000 Gross per 24 hour  Intake 1745 ml  Output 10 ml  Net 1735 ml     Physical Exam: General: No acute distress  HEENT Normocephalic, moist oral mucosa  Pulm/lungs Clear lungs  CVS/Heart S1S2 present  Abdomen:  Soft, nontener  Extremities: No peripheral edema, Rt BKA, Rt knee ace bandaged  Neurologic: Alert, oriented  Skin: No masses or rashes  Access: Lt AVF        Basic Metabolic Panel:  Recent Labs  Lab 07/02/20 1429 07/03/20 0632  NA 138 134*  K 4.1 5.6*  CL 94* 94*  CO2 30 28  GLUCOSE 95 88  BUN 24* 36*  CREATININE 4.21* 5.56*  CALCIUM 8.7* 9.0     CBC: Recent Labs  Lab 07/02/20 1429 07/03/20 0632  WBC 7.5 6.3  NEUTROABS 5.4  --   HGB 9.6* 9.2*  HCT 31.4* 30.2*  MCV 104.3* 108.6*  PLT 125* 95*      Lab Results  Component Value Date   HEPBSAG NON REACTIVE 04/13/2020      Microbiology:  Recent  Results (from the past 240 hour(s))  Blood culture (single)     Status: None (Preliminary result)   Collection Time: 07/02/20  2:36 PM   Specimen: BLOOD  Result Value Ref Range Status   Specimen Description BLOOD BLOOD RIGHT FOREARM  Final   Special Requests   Final    BOTTLES DRAWN AEROBIC AND ANAEROBIC Blood Culture adequate volume   Culture   Final    NO GROWTH < 24 HOURS Performed at [facility], [address] [place], [place] [address]    Report Status PENDING  Incomplete  Blood culture (single)     Status: None (Preliminary result)   Collection Time: 07/02/20  3:37 PM   Specimen: BLOOD  Result Value Ref Range Status   Specimen Description BLOOD RIGHT ANTECUBITAL  Final   Special Requests   Final    BOTTLES DRAWN AEROBIC AND ANAEROBIC Blood Culture adequate volume   Culture   Final    NO GROWTH < 24 HOURS Performed at [facility], [address] [place], [place] [address]    Report Status PENDING  Incomplete  Resp Panel by RT-PCR (Flu A&B, Covid) Nasopharyngeal Swab     Status: None   Collection Time: 07/02/20  3:37 PM  Specimen: Nasopharyngeal Swab; Nasopharyngeal(NP) swabs in vial transport medium  Result Value Ref Range Status   SARS Coronavirus 2 by RT PCR NEGATIVE NEGATIVE Final    Comment: (NOTE) SARS-CoV-2 target nucleic acids are NOT DETECTED.  The SARS-CoV-2 RNA is generally detectable in upper respiratory specimens during the acute phase of infection. The lowest concentration of SARS-CoV-2 viral copies this assay can detect is 138 copies/mL. A negative result does not preclude SARS-Cov-2 infection and should not be used as the sole basis for treatment or other patient management decisions. A negative result may occur with  improper specimen collection/handling, submission of specimen other than nasopharyngeal swab, presence of viral mutation(s) within the areas targeted by this assay, and inadequate number of viral copies(<138  copies/mL). A negative result must be combined with clinical observations, patient history, and epidemiological information. The expected result is Negative.  Fact Sheet for Patients:  EntrepreneurPulse.com.au  Fact Sheet for Healthcare Providers:  IncredibleEmployment.be  This test is no t yet approved or cleared by the [place] FDA and  has been authorized for detection and/or diagnosis of SARS-CoV-2 by FDA under an Emergency Use Authorization (EUA). This EUA will remain  in effect (meaning this test can be used) for the duration of the COVID-19 declaration under Section 564(b)(1) of the Act, 21 U.S.C.section 360bbb-3(b)(1), unless the authorization is terminated  or revoked sooner.       Influenza A by PCR NEGATIVE NEGATIVE Final   Influenza B by PCR NEGATIVE NEGATIVE Final    Comment: (NOTE) The Xpert Xpress SARS-CoV-2/FLU/RSV plus assay is intended as an aid in the diagnosis of influenza from Nasopharyngeal swab specimens and should not be used as a sole basis for treatment. Nasal washings and aspirates are unacceptable for Xpert Xpress SARS-CoV-2/FLU/RSV testing.  Fact Sheet for Patients: EntrepreneurPulse.com.au  Fact Sheet for Healthcare Providers: IncredibleEmployment.be  This test is not yet approved or cleared by the [place] FDA and has been authorized for detection and/or diagnosis of SARS-CoV-2 by FDA under an Emergency Use Authorization (EUA). This EUA will remain in effect (meaning this test can be used) for the duration of the COVID-19 declaration under Section 564(b)(1) of the Act, 21 U.S.C. section 360bbb-3(b)(1), unless the authorization is terminated or revoked.  Performed at [facility], [address] [place], [place] [address]   Aerobic/Anaerobic Culture w Gram Stain (surgical/deep wound)     Status: None (Preliminary result)   Collection Time: 07/02/20   9:50 PM   Specimen: PATH Other; Body Fluid  Result Value Ref Range Status   Specimen Description KNEE RIGHT JOINT  Final   Special Requests   Final    ID 1 PATIENT ON FOLLOWING VANC,ROCEPHIN SWAB Performed at [facility], [place] [address], [place], [name] [address]    Gram Stain PENDING  Incomplete   Culture PENDING  Incomplete   Report Status PENDING  Incomplete  Aerobic/Anaerobic Culture w Gram Stain (surgical/deep wound)     Status: None (Preliminary result)   Collection Time: 07/02/20  9:51 PM   Specimen: PATH Other; Body Fluid  Result Value Ref Range Status   Specimen Description KNEE RIGHT JOINT  Final   Special Requests   Final    ID 2 SWAB Performed at [facility], [place] [address], [place], [place] [address]    Gram Stain PENDING  Incomplete   Culture PENDING  Incomplete   Report Status PENDING  Incomplete    Coagulation Studies: Recent Labs  07/02/20 1537 07/03/20 0632  LABPROT 33.2* 34.9*  INR 3.4* 3.6*    Urinalysis: No results for input(s): COLORURINE, LABSPEC, PHURINE, GLUCOSEU, HGBUR, BILIRUBINUR, KETONESUR, PROTEINUR, UROBILINOGEN, NITRITE, LEUKOCYTESUR in the last 72 hours.  Invalid input(s): APPERANCEUR    Imaging: DG Chest 2 View  Result Date: 07/02/2020 CLINICAL DATA:  Fever, weakness. EXAM: CHEST - 2 VIEW COMPARISON:  Chest x-ray 04/17/2020, CT chest 04/19/2020 FINDINGS: Similar-appearing cardiac silhouette enlargement. The heart size and mediastinal contours are unchanged. Aortic arch calcifications. Redemonstration of aortic and tricuspid valve replacement. Biapical pleural/pulmonary scarring. No focal consolidation. Question Kerley B lines along the left lower lobe. No definite increased interstitial markings. No pleural effusion. No pneumothorax. No acute osseous abnormality.  Old healed right rib fractures. IMPRESSION: Question Kerley B lines along the left lower lobe suggestive of pulmonary edema. Electronically Signed   By: Iven Finn M.D.   On: 07/02/2020 15:25   DG Knee Right Port  Result Date: 07/02/2020 CLINICAL DATA:  Right knee pain and swelling. EXAM: PORTABLE RIGHT KNEE - 1-2 VIEW COMPARISON:  May 14, 2017. FINDINGS: Status post right below-knee amputation. Vascular calcifications are noted. No acute fracture or dislocation is noted. Small suprapatellar joint effusion is noted. IMPRESSION: Small suprapatellar joint effusion. No acute fracture or dislocation is noted. Electronically Signed   By: Marijo Conception M.D.   On: 07/02/2020 16:29     Medications:   . cefTRIAXone (ROCEPHIN)  IV    . [START ON 07/04/2020] vancomycin     . metoprolol tartrate  50 mg Oral BID   acetaminophen **OR** acetaminophen, morphine injection, ondansetron **OR** ondansetron (ZOFRAN) IV  Assessment/ Plan:  65 y.o. male with  was admitted on 07/02/2020 for  Principal Problem:   Sepsis ([place]) Active Problems:   ESRD (end stage renal disease) ([place])   CAD (coronary artery disease)   Hyperlipidemia   Anemia in chronic kidney disease   Atrial fibrillation (HCC)   Aortic valve disorder   Below-knee amputation of left lower extremity (HCC)   Septic arthritis (HCC)  Pyogenic arthritis of right knee joint, due to unspecified organism ([place]) [M00.9] Sepsis without acute organ dysfunction, due to unspecified organism ([place]) [A41.9] Sepsis ([place]) [A41.9]   [facility], MWF/ Lt AVF  #. ESRD on HD -received dialysis yesterday at outpatient center - Will schedule for dialysis tomorrow   #. Anemia of CKD  Lab Results  Component Value Date   HGB 9.2 (L) 07/03/2020   EPO prescribed as outpatient Continue EPO 10000 units with HD   #. Secondary hyperparathyroidism of renal origin N 25.81      Component Value Date/Time   PTH 141 (H) 09/30/2019 2023   PTH Comment 12/14/2015 1059   Lab Results  Component Value Date   PHOS 6.1 (H) 05/03/2020   Monitor calcium and phos level during this admission   #. Diabetes  type 2 with CKD Hgb A1c MFr Bld (%)  Date Value  09/15/2018 5.9 (H)  Glucose stable   LOS: [place] 3/29/20221:39 [place], [place]

## 2020-07-03 NOTE — Anesthesia Postprocedure Evaluation (Signed)
Anesthesia Post Note  Patient: Jeremy [name]  Procedure(s) Performed: KNEE ARTHROSCOPY WITH INCISION AND DRAINAGE (Right Knee)  Patient location during evaluation: PACU Anesthesia Type: General Level of consciousness: awake and alert Pain management: pain level controlled Vital Signs Assessment: post-procedure vital signs reviewed and stable Respiratory status: spontaneous breathing, nonlabored ventilation, respiratory function stable and patient connected to nasal cannula oxygen Cardiovascular status: blood pressure returned to baseline and stable Postop Assessment: no apparent nausea or vomiting Anesthetic complications: no   No complications documented.   Last Vitals:  Vitals:   07/03/20 0100 07/03/20 0130  BP: 121/74 130/77  Pulse: (!) 113 (!) 113  Resp: 13 (!) 7  Temp:    SpO2: 100% 100%    Last Pain:  Vitals:   07/03/20 0130  TempSrc:   PainSc: Asleep                 Arita Miss

## 2020-07-03 NOTE — Progress Notes (Signed)
PROGRESS NOTE   HPI was taken from Dr. Jonelle Sidle: Jeremy [name] is a 65 y.o. 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 Katheran Awe, patient presented to the ER with significant right knee swelling pain fever and tenderness.  He meets sepsis criteria.  Patient also has significant mild distress.  Seen in the ER and evaluated.  The joint appears swollen tender and septic.  Aspiration showed significant leukocytosis mainly neutrophils, no crystals.  Patient is afebrile.  Orthopedics consulted and patient has had surgery and just completed procedure.  Patient is being admitted to the hospital for continued antibiotic and treatment.  ED Course: Temperature is 100.8 blood pressure is 86/59 pulse 127 respiratory rate of 28 oxygen sats 94% room air.  Synovial fluid is turbid with 65,000 white cell 98% neutrophils.  X-ray of the knee shows small suprapatellar joint effusions no fractures.  Sodium 130 potassium 4 (2 glucose 95 BUN 24 creatinine 4.2 lactic acid is 2.6.  Hemoglobin 9.6.  INR of 3.4.  Patient is admitted for further evaluation and treatment     Jeremy [name]  FOY:[id number] DOB: Sep 20, 1955 DOA: 07/02/2020 PCP: Leone Haven, MD   Assessment & Plan:   Principal Problem:   Sepsis [facility]) Active Problems:   ESRD (end stage renal disease) ([place])   CAD (coronary artery disease)   Hyperlipidemia   Anemia in chronic kidney disease   Atrial fibrillation ([place])   Aortic valve disorder   Below-knee amputation of left lower extremity ([place])   Septic arthritis ([place])   Severe sepsis: meets criteria w/ fever, tachycardia, tachypnea and likely septic arthritis. Continue on IV  ceftriaxone, vanco. Right knee cx is pending. Blood cxs NGTD. Ortho surg consulted.   Right knee septic arthritis: s/p surgery as per ortho surg on 07/02/20. Continue on IV ceftriaxone, vanco.   Right knee cxs are pending. ID consulted   ESRD: on HD MWF. S/p previous kidney transplant. Nephro consulted   Hyperkalemia: will be managed w/ HD   Macrocytic anemia: will check B12 & folate levels. No need for a transfusion currently  Thrombocytopenia: etiology unclear. Will continue to monitor   PVD: s/p right BKA in Jan 2022.   CAD: stable. table.   Likely PAF: continue on coumadin. Will monitor INR daily   S/p atrial valve placement: continue on coumadin. Will monitor INR daily    DVT prophylaxis: SCDs Code Status: full  Family Communication:  Disposition Plan: depends on PT/OT recs (not consulted yet)   Level of care: Progressive Cardiac   Status is: Inpatient  Remains inpatient appropriate because:Unsafe d/c plan, IV treatments appropriate due to intensity of illness or inability to take PO and Inpatient level of care appropriate due to severity of illness   Dispo: The patient is from: Home              Anticipated d/c is to: Home              Patient currently is not medically stable to d/c.   Difficult to place patient Yes        Consultants:   Ortho surg  ID    Procedures:    Antimicrobials: vanco, ceftriaxone    Subjective Pt c/o right knee pain  Objective: Vitals:   07/03/20 0510 07/03/20 0515 07/03/20 0622 07/03/20 0757  BP:  119/68 (!) 122/101 (!) 146/87  Pulse: (!) 117 (!) 117  (!) 116  Resp: (!) 25 (!) 9 18 18   Temp:   98.1 F (36.7 C) 98.6 F (37 C)  TempSrc:   Oral Oral  SpO2: 97% 98% 96% 100%  Weight:   73.7 kg   Height:   5\' 10"  (1.778 m)     Intake/Output Summary (Last 24 hours) at 07/03/2020 0817 Last data filed at 07/03/2020 0500 Gross per 24 hour  Intake 1625 ml  Output 10 ml  Net 1615 ml   Filed Weights   07/02/20  1424 07/03/20 0622  Weight: 73.5 kg 73.7 kg    Examination:  General exam: Appears calm and comfortable  Respiratory system: Clear to auscultation. Respiratory effort normal. Cardiovascular system: S1 & S2 +. No rubs, gallops or clicks.  Gastrointestinal system: Abdomen is nondistended, soft and nontender. Normal bowel sounds heard. Central nervous system: Alert and oriented. Moves all extremities  Psychiatry: Judgement and insight appear normal. Mood & affect appropriate.     Data Reviewed: I have personally reviewed following labs and imaging studies  CBC: Recent Labs  Lab 07/02/20 1429 07/03/20 0632  WBC 7.5 6.3  NEUTROABS 5.4  --   HGB 9.6* 9.2*  HCT 31.4* 30.2*  MCV 104.3* 108.6*  PLT 125* 95*   Basic Metabolic Panel: Recent Labs  Lab 07/02/20 1429 07/03/20 0632  NA 138 134*  K 4.1 5.6*  CL 94* 94*  CO2 30 28  GLUCOSE 95 88  BUN 24* 36*  CREATININE 4.21* 5.56*  CALCIUM 8.7* 9.0   GFR: Estimated Creatinine Clearance: 14 mL/min (A) (by C-G formula based on SCr of 5.56 mg/dL (H)). Liver Function Tests: Recent Labs  Lab 07/02/20 1429 07/03/20 0632  AST 25 20  ALT 18 14  ALKPHOS 127* 97  BILITOT 1.0 0.8  PROT 7.6 6.5  ALBUMIN 3.5 2.9*   No results for input(s): LIPASE, AMYLASE in the last 168 hours. No results for input(s): AMMONIA in the last 168 hours. Coagulation Profile: Recent Labs  Lab 07/02/20 1537 07/03/20 0632  INR 3.4* 3.6*   Cardiac Enzymes: No results for input(s): CKTOTAL, CKMB, CKMBINDEX, TROPONINI in the last 168 hours. BNP (last 3 results) No results for input(s): PROBNP in the last 8760 hours. HbA1C: No results for input(s): HGBA1C in the last 72 hours. CBG: No results for input(s): GLUCAP in the last 168 hours. Lipid Profile: No results for input(s): CHOL, HDL, LDLCALC, TRIG, CHOLHDL, LDLDIRECT in the last 72 hours. Thyroid Function Tests: No results for input(s): TSH, T4TOTAL, FREET4, T3FREE, THYROIDAB in the last 72  hours. Anemia Panel: No results for input(s): VITAMINB12, FOLATE, FERRITIN, TIBC, IRON, RETICCTPCT in the last 72 hours. Sepsis Labs: Recent Labs  Lab 07/02/20 1429 07/02/20 1537 07/02/20 1750 07/03/20 6222  PROCALCITON  --   --   --  2.39  LATICACIDVEN 2.0* 2.6* 1.3  --     Recent Results (from the past 240 hour(s))  Blood culture (single)     Status: None (Preliminary result)   Collection Time: 07/02/20  2:36 PM   Specimen: BLOOD  Result Value Ref Range Status   Specimen Description BLOOD BLOOD RIGHT FOREARM  Final   Special Requests   Final    BOTTLES DRAWN AEROBIC AND ANAEROBIC Blood Culture adequate volume   Culture   Final    NO GROWTH < 24 HOURS  Performed at [facility], [address] [place], [place] [address]    Report Status PENDING  Incomplete  Blood culture (single)     Status: None (Preliminary result)   Collection Time: 07/02/20  3:37 PM   Specimen: BLOOD  Result Value Ref Range Status   Specimen Description BLOOD RIGHT ANTECUBITAL  Final   Special Requests   Final    BOTTLES DRAWN AEROBIC AND ANAEROBIC Blood Culture adequate volume   Culture   Final    NO GROWTH < 24 HOURS Performed at [facility], [address], [place], [place] [address]    Report Status PENDING  Incomplete  Resp Panel by RT-PCR (Flu A&B, Covid) Nasopharyngeal Swab     Status: None   Collection Time: 07/02/20  3:37 PM   Specimen: Nasopharyngeal Swab; Nasopharyngeal(NP) swabs in vial transport medium  Result Value Ref Range Status   SARS Coronavirus 2 by RT PCR NEGATIVE NEGATIVE Final    Comment: (NOTE) SARS-CoV-2 target nucleic acids are NOT DETECTED.  The SARS-CoV-2 RNA is generally detectable in upper respiratory specimens during the acute phase of infection. The lowest concentration of SARS-CoV-2 viral copies this assay can detect is 138 copies/mL. A negative result does not preclude SARS-Cov-2 infection and should not be used as the sole basis for  treatment or other patient management decisions. A negative result may occur with  improper specimen collection/handling, submission of specimen other than nasopharyngeal swab, presence of viral mutation(s) within the areas targeted by this assay, and inadequate number of viral copies(<138 copies/mL). A negative result must be combined with clinical observations, patient history, and epidemiological information. The expected result is Negative.  Fact Sheet for Patients:  EntrepreneurPulse.com.au  Fact Sheet for Healthcare Providers:  IncredibleEmployment.be  This test is no t yet approved or cleared by the [place] FDA and  has been authorized for detection and/or diagnosis of SARS-CoV-2 by FDA under an Emergency Use Authorization (EUA). This EUA will remain  in effect (meaning this test can be used) for the duration of the COVID-19 declaration under Section 564(b)(1) of the Act, 21 U.S.C.section 360bbb-3(b)(1), unless the authorization is terminated  or revoked sooner.       Influenza A by PCR NEGATIVE NEGATIVE Final   Influenza B by PCR NEGATIVE NEGATIVE Final    Comment: (NOTE) The Xpert Xpress SARS-CoV-2/FLU/RSV plus assay is intended as an aid in the diagnosis of influenza from Nasopharyngeal swab specimens and should not be used as a sole basis for treatment. Nasal washings and aspirates are unacceptable for Xpert Xpress SARS-CoV-2/FLU/RSV testing.  Fact Sheet for Patients: EntrepreneurPulse.com.au  Fact Sheet for Healthcare Providers: IncredibleEmployment.be  This test is not yet approved or cleared by the [place] FDA and has been authorized for detection and/or diagnosis of SARS-CoV-2 by FDA under an Emergency Use Authorization (EUA). This EUA will remain in effect (meaning this test can be used) for the duration of the COVID-19 declaration under Section 564(b)(1) of the Act, 21  U.S.C. section 360bbb-3(b)(1), unless the authorization is terminated or revoked.  Performed at [facility], [address] [place], [place] [address]   Aerobic/Anaerobic Culture w Gram Stain (surgical/deep wound)     Status: None (Preliminary result)   Collection Time: 07/02/20  9:50 PM   Specimen: PATH Other; Body Fluid  Result Value Ref Range Status   Specimen Description KNEE RIGHT JOINT  Final   Special Requests   Final    ID 1 PATIENT ON FOLLOWING VANC,ROCEPHIN  SWAB Performed at [facility], [place] [address], [place], [name] [address]    Gram Stain PENDING  Incomplete   Culture PENDING  Incomplete   Report Status PENDING  Incomplete  Aerobic/Anaerobic Culture w Gram Stain (surgical/deep wound)     Status: None (Preliminary result)   Collection Time: 07/02/20  9:51 PM   Specimen: PATH Other; Body Fluid  Result Value Ref Range Status   Specimen Description KNEE RIGHT JOINT  Final   Special Requests   Final    ID 2 SWAB Performed at [facility], [place] [address], [place], [place] [address]    Gram Stain PENDING  Incomplete   Culture PENDING  Incomplete   Report Status PENDING  Incomplete         Radiology Studies: DG Chest 2 View  Result Date: 07/02/2020 CLINICAL DATA:  Fever, weakness. EXAM: CHEST - 2 VIEW COMPARISON:  Chest x-ray 04/17/2020, CT chest 04/19/2020 FINDINGS: Similar-appearing cardiac silhouette enlargement. The heart size and mediastinal contours are unchanged. Aortic arch calcifications. Redemonstration of aortic and tricuspid valve replacement. Biapical pleural/pulmonary scarring. No focal consolidation. Question Kerley B lines along the left lower lobe. No definite increased interstitial markings. No pleural effusion. No pneumothorax. No acute osseous abnormality.  Old healed right rib fractures. IMPRESSION: Question Kerley B lines along the left lower lobe suggestive of pulmonary edema. Electronically Signed   By: Iven Finn  M.D.   On: 07/02/2020 15:25   DG Knee Right Port  Result Date: 07/02/2020 CLINICAL DATA:  Right knee pain and swelling. EXAM: PORTABLE RIGHT KNEE - 1-2 VIEW COMPARISON:  May 14, 2017. FINDINGS: Status post right below-knee amputation. Vascular calcifications are noted. No acute fracture or dislocation is noted. Small suprapatellar joint effusion is noted. IMPRESSION: Small suprapatellar joint effusion. No acute fracture or dislocation is noted. Electronically Signed   By: Marijo Conception M.D.   On: 07/02/2020 16:29        Scheduled Meds: . sodium zirconium cyclosilicate  10 g Oral Once   Continuous Infusions: . cefTRIAXone (ROCEPHIN)  IV    . [START ON 07/04/2020] vancomycin       LOS: 1 day    Time spent: 33 mins     Wyvonnia Dusky, MD [facility] Pager [phone]  If 7PM-7AM, please contact night-coverage 07/03/2020, 8:17 AM

## 2020-07-03 NOTE — Progress Notes (Signed)
[place] for Warfarin  Indication: atrial fibrillation & mech AVR.   Allergies  Allergen Reactions  . Statins Other (See Comments)    Arthralgias (intolerance), Myalgias (intolerance)  Pt taking pravastatin   . Gabapentin     Drowsy   . Sertraline Rash    Patient Measurements: Height: 5\' 10"  (177.8 cm) Weight: 73.7 kg (162 lb 6.4 oz) IBW/kg (Calculated) : 73  Vital Signs: Temp: 98.6 F (37 C) (03/29 0757) Temp Source: Oral (03/29 0757) BP: 146/87 (03/29 0757) Pulse Rate: 116 (03/29 0757)  Labs: Recent Labs    07/02/20 1429 07/02/20 1537 07/03/20 0632  HGB 9.6*  --  9.2*  HCT 31.4*  --  30.2*  PLT 125*  --  95*  LABPROT  --  33.2* 34.9*  INR  --  3.4* 3.6*  CREATININE 4.21*  --  5.56*    Estimated Creatinine Clearance: 14 mL/min (A) (by C-G formula based on SCr of 5.56 mg/dL (H)).   Medical History: Past Medical History:  Diagnosis Date  . Abnormal stress test 03/19/2018  . 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 Aortic stenosis    a. 2014 s/p [facility] AVR ([facility])-->chronic coumadin; b. 08/2018 Echo: Mild AS/AI. Nl fxn/gradient.  . Arthritis   . Atrial fibrillation ([place])   . CAD (coronary artery disease)    a. 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 02/2018 MV [facility]): large inf, infsept, inflat infarct w/ min rev, EF 30%; c. 06/2018 Cath: LM min irregs, LAD 60p, 95/30/1m, LCX min irregs, RCA 40p, 54m, 90d, RPAV 90, RPDA 95ost; d. 09/2018 CABGx3 (LIMA->LAD, VG->D1, VG->PDA).  . Chronic anticoagulation 06/2012   a. Coumadin in the setting of mech AVR and afib.  Marland Kitchen COVID-19 03/15/2019  . CPAP (continuous positive airway pressure) dependence   . Dialysis patient [facility]) 04/07/2006   Patient started HD around 1996,  had 1st transplant LLQ around 1998 until 2007 when they found renal cell cancer in transplant and both native kidneys, all were removed > went back on HD  until 2nd transplant RLQ in 2014 which lasted 3 yrs; it was embolized for suspected acute rejection. Is currently on HD MWF in [place] w/ [facility] nephrology.  . ESRD (end stage renal disease) ([place])    a. 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 1999 s/p Kidney transplant; c. 2014 Bilat nephrectomy (transplanted kidney resected) in the setting of renal cell carcinoma; d. PD until 11/2017-->HD.  Marland Kitchen Heart murmur   . HFrEF (heart failure with reduced ejection fraction) ([name])    a. 07/2016 Echo: EF 55-60%; b. 02/2018 MV: EF 30%; c. 04/2018 Echo: EF 35%; d. 08/2018 Echo: EF 30-35%. Glob HK w/o rwma. Mildly reduced RV fxn. Mod to sev dil LA. Nl AoV fxn.  Marland Kitchen Hx of colonoscopy    2009 by Dr. Laural Golden. Normal except for small submucosal lipoma. No evidence of polyps or colitis.  . Hyperlipidemia   . Hypertension   . Hypogonadism in male 07/25/2015  . Ischemic cardiomyopathy    a. 07/2016 Echo: EF 55-60%; b. 02/2018 MV: EF 30%; c. 04/2018 Echo: EF 35%; d. 08/2018 Echo: EF 30-35%.  . OSA (obstructive sleep apnea)    No CPAP  . Permanent atrial fibrillation (HCC)    a. CHA2DS2VASc = 3-->Coumadin (also mech AVR); b. 06/2018 Amio started 2/2 elevated rates.  . Renal cell cancer ([place])   .  Renal cell carcinoma ([place]) 08/04/2016    Medications:  Medications Prior to Admission  Medication Sig Dispense Refill Last Dose  . acetaminophen (TYLENOL) 500 MG tablet Take 1 tablet (500 mg total) by mouth every 4 (four) hours as needed. 30 tablet 0   . allopurinol (ZYLOPRIM) 100 MG tablet Take 1 tablet (100 mg total) by mouth daily. 30 tablet 0   . CHOLECALCIFEROL PO Take by mouth.     . clopidogrel (PLAVIX) 75 MG tablet Take 1 tablet (75 mg total) by mouth daily with breakfast. 90 tablet 3   . Epoetin Alfa-epbx (RETACRIT IJ) Epoetin alfa - epbx (Retacrit)     . ezetimibe (ZETIA) 10 MG tablet TAKE 1 TABLET BY MOUTH EVERY DAY (Patient taking differently: Take 10 mg by mouth daily.) 90 tablet 1   . hydroxychloroquine  (PLAQUENIL) 200 MG tablet Take 400 mg by mouth at bedtime.     . iron sucrose in sodium chloride 0.9 % 100 mL Iron Sucrose (Venofer)     . lamoTRIgine (LAMICTAL) 25 MG tablet Take 2 tablets (50 mg total) by mouth 2 (two) times daily. 120 tablet 0   . lidocaine-prilocaine (EMLA) cream Apply 1 application topically daily as needed (for fistula).     . loperamide (IMODIUM A-D) 2 MG tablet Take 2 mg by mouth 4 (four) times daily as needed for diarrhea or loose stools.     . megestrol (MEGACE) 40 MG/ML suspension TAKE 10 ML (400 MG TOTAL) BY MOUTH DAILY.     . metoprolol tartrate 75 MG TABS Take 75 mg by mouth 2 (two) times daily. 180 tablet 3   . midodrine (PROAMATINE) 10 MG tablet TAKE 1 TABLET BY MOUTH 3 TIMES A WEEK AS DIRECTED TAKE 1 TAB BEFORE DIALYSIS MAY REPEAT ONCE AS NEED     . multivitamin (RENA-VIT) TABS tablet Take 1 tablet by mouth daily.     . nitroGLYCERIN (NITROSTAT) 0.4 MG SL tablet Place 1 tablet (0.4 mg total) under the tongue every 5 (five) minutes as needed for chest pain. 25 tablet 3   . oxyCODONE-acetaminophen (PERCOCET/ROXICET) 5-325 MG tablet      . pantoprazole (PROTONIX) 40 MG tablet Take 1 tablet (40 mg total) by mouth daily. 90 tablet 3   . polyethylene glycol (MIRALAX / GLYCOLAX) 17 g packet Take 17 g by mouth daily as needed for mild constipation. 30 each 0   . predniSONE (DELTASONE) 5 MG tablet Take 5 mg by mouth daily.     . rosuvastatin (CRESTOR) 40 MG tablet Take 1 tablet (40 mg total) by mouth daily at 6 PM. 90 tablet 3   . sevelamer carbonate (RENVELA) 800 MG tablet Take 3,200 mg by mouth 3 (three) times daily with meals.     . traZODone (DESYREL) 50 MG tablet TAKE 1/2 -1 TABLET (25-50 MG TOTAL) BY MOUTH AT BEDTIME AS NEEDED FOR SLEEP. (Patient taking differently: Take 25-50 mg by mouth at bedtime.) 90 tablet 2   . warfarin (COUMADIN) 1 MG tablet Take 1 to 2 tablets daily as directed by the coumadin clinic 90 tablet 0     Assessment: Pharmacy consulted to dose  warfarin for Afib in this 65 year old male admitted with septic arthritis.  Pt is s/p I&D of septic knee on 3/28. No major DDI.   Home regimen: per last communication for warfarin dosing: 1 mg daily.    Date INR Warfarin Dose  3/28 3.4 HOLD  3/29 3.6 HOLD    Goal of  Therapy:  INR 2-3 Monitor platelets by anticoagulation protocol: Yes   Plan:  INR is supratherapeutic. Will hold warfarin dose tonight. Daily INR ordered. CBC at least every 3 days.   Oswald Hillock, PharmD, BCPS 07/03/2020,8:20 AM

## 2020-07-03 NOTE — Progress Notes (Signed)
POD#1 s/p R knee arthroscopic I&D. States that he had some increased knee RoM with pain medications earlier. Overall, feels relatively well.   Exam: Drain & dressing in place Knee RoM still painful. 20 deg flex contracture to ~40 deg knee flexion   - Continue IV Abx - F/U Cx results - still pending - Discussed patient with Dr. Delaine Lame with ID who will plan to see patient - Will plan to remove drain tomorrow

## 2020-07-04 DIAGNOSIS — N186 End stage renal disease: Secondary | ICD-10-CM

## 2020-07-04 DIAGNOSIS — K5900 Constipation, unspecified: Secondary | ICD-10-CM

## 2020-07-04 DIAGNOSIS — Z992 Dependence on renal dialysis: Secondary | ICD-10-CM

## 2020-07-04 DIAGNOSIS — I2510 Atherosclerotic heart disease of native coronary artery without angina pectoris: Secondary | ICD-10-CM

## 2020-07-04 DIAGNOSIS — M009 Pyogenic arthritis, unspecified: Secondary | ICD-10-CM | POA: Diagnosis not present

## 2020-07-04 LAB — PROTIME-INR
INR: 4.3 (ref 0.8–1.2)
Prothrombin Time: 40.2 seconds — ABNORMAL HIGH (ref 11.4–15.2)

## 2020-07-04 LAB — POTASSIUM: Potassium: 5.1 mmol/L (ref 3.5–5.1)

## 2020-07-04 LAB — BASIC METABOLIC PANEL
Anion gap: 15 (ref 5–15)
BUN: 57 mg/dL — ABNORMAL HIGH (ref 8–23)
CO2: 26 mmol/L (ref 22–32)
Calcium: 9.7 mg/dL (ref 8.9–10.3)
Chloride: 91 mmol/L — ABNORMAL LOW (ref 98–111)
Creatinine, Ser: 7.59 mg/dL — ABNORMAL HIGH (ref 0.61–1.24)
GFR, Estimated: 7 mL/min — ABNORMAL LOW (ref >60)
Glucose, Bld: 90 mg/dL (ref 70–99)
Potassium: 6.3 mmol/L (ref 3.5–5.1)
Sodium: 132 mmol/L — ABNORMAL LOW (ref 135–145)

## 2020-07-04 LAB — CBC
HCT: 30.6 % — ABNORMAL LOW (ref 39.0–52.0)
Hemoglobin: 9.3 g/dL — ABNORMAL LOW (ref 13.0–17.0)
MCH: 32.2 pg (ref 26.0–34.0)
MCHC: 30.4 g/dL (ref 30.0–36.0)
MCV: 105.9 fL — ABNORMAL HIGH (ref 80.0–100.0)
Platelets: 104 10*3/uL — ABNORMAL LOW (ref 150–400)
RBC: 2.89 MIL/uL — ABNORMAL LOW (ref 4.22–5.81)
RDW: 17.3 % — ABNORMAL HIGH (ref 11.5–15.5)
WBC: 7.3 10*3/uL (ref 4.0–10.5)
nRBC: 0 % (ref 0.0–0.2)

## 2020-07-04 LAB — FOLATE: Folate: 36 ng/mL (ref >5.9)

## 2020-07-04 LAB — VITAMIN B12: Vitamin B-12: 251 pg/mL (ref 180–914)

## 2020-07-04 MED ORDER — SENNOSIDES-DOCUSATE SODIUM 8.6-50 MG PO TABS
2.0000 | ORAL_TABLET | Freq: Two times a day (BID) | ORAL | Status: DC
  Administered 2020-07-04 – 2020-07-08 (×6): 2 via ORAL
  Filled 2020-07-04 (×8): qty 2

## 2020-07-04 MED ORDER — LAMOTRIGINE 100 MG PO TABS
50.0000 mg | ORAL_TABLET | Freq: Two times a day (BID) | ORAL | Status: DC
  Administered 2020-07-04 – 2020-07-07 (×6): 50 mg via ORAL
  Filled 2020-07-04 (×5): qty 1
  Filled 2020-07-04: qty 2
  Filled 2020-07-04: qty 1

## 2020-07-04 MED ORDER — POLYETHYLENE GLYCOL 3350 17 G PO PACK
17.0000 g | PACK | Freq: Every day | ORAL | Status: DC
  Administered 2020-07-04 – 2020-07-08 (×5): 17 g via ORAL
  Filled 2020-07-04 (×5): qty 1

## 2020-07-04 NOTE — Progress Notes (Signed)
   07/04/20 1548  Assess: MEWS Score  Temp 98 F (36.7 C)  BP 134/75  Pulse Rate (!) 122  ECG Heart Rate (!) 122  Resp 18  Level of Consciousness Alert  SpO2 96 %  O2 Device Room Air  Assess: MEWS Score  MEWS Temp 0  MEWS Systolic 0  MEWS Pulse 2  MEWS RR 0  MEWS LOC 0  MEWS Score 2  MEWS Score Color Yellow  Assess: if the MEWS score is Yellow or Red  Were vital signs taken at a resting state? Yes  Focused Assessment No change from prior assessment  Early Detection of Sepsis Score *See Row Information* Low  MEWS guidelines implemented *See Row Information* Yes  Treat  MEWS Interventions Administered scheduled meds/treatments (metoprolol given)  Pain Scale 0-10  Pain Score 7  Pain Type Surgical pain  Pain Location Knee  Pain Orientation Right  Pain Descriptors / Indicators Discomfort  Take Vital Signs  Increase Vital Sign Frequency  Yellow: Q 2hr X 2 then Q 4hr X 2, if remains yellow, continue Q 4hrs  Escalate  MEWS: Escalate Yellow: discuss with charge nurse/RN and consider discussing with provider and RRT  Notify: Charge Nurse/RN  Name of Charge Nurse/RN Notified Idamae Lusher  Date Charge Nurse/RN Notified 07/04/20  Time Charge Nurse/RN Notified 1550  Document  Patient Outcome Stabilized after interventions (metoprolol given)

## 2020-07-04 NOTE — Progress Notes (Signed)
Subjective: 2 Days Post-Op Procedure(s) (LRB): KNEE ARTHROSCOPY WITH INCISION AND DRAINAGE (Right) Patient reports pain as moderate.   Patient is well, and has had no acute complaints or problems Plan is to go Home vs rehab after hospital stay. Negative for chest pain and shortness of breath Fever: no Gastrointestinal:negative for nausea and vomiting  Objective: Vital signs in last 24 hours: Temp:  [98.4 F (36.9 C)-99.9 F (37.7 C)] 99.6 F (37.6 C) (03/30 0557) Pulse Rate:  [101-118] 109 (03/30 0557) Resp:  [16-20] 16 (03/30 0557) BP: (111-146)/(72-87) 134/82 (03/30 0557) SpO2:  [90 %-100 %] 90 % (03/30 0557) Weight:  [75.9 kg] 75.9 kg (03/30 0600)  Intake/Output from previous day:  Intake/Output Summary (Last 24 hours) at 07/04/2020 0639 Last data filed at 07/04/2020 0604 Gross per 24 hour  Intake 791.08 ml  Output 20 ml  Net 771.08 ml    Intake/Output this shift: Total I/O In: 91.1 [IV Piggyback:91.1] Out: 20 [Drains:20]  Labs: Recent Labs    07/02/20 1429 07/03/20 0632 07/04/20 0544  HGB 9.6* 9.2* 9.3*   Recent Labs    07/03/20 0632 07/04/20 0544  WBC 6.3 7.3  RBC 2.78* 2.89*  HCT 30.2* 30.6*  PLT 95* 104*   Recent Labs    07/02/20 1429 07/03/20 0632  NA 138 134*  K 4.1 5.6*  CL 94* 94*  CO2 30 28  BUN 24* 36*  CREATININE 4.21* 5.56*  GLUCOSE 95 88  CALCIUM 8.7* 9.0   Recent Labs    07/02/20 1537 07/03/20 0632  INR 3.4* 3.6*     EXAM General - Patient is Alert and Oriented Extremity - limited movement with mild effusion Dressing/Incision - clean, dry, with the hemovac removed with no complications Motor Function - intact, moving knee slightly on exam.   Past Medical History:  Diagnosis Date  . Abnormal stress test 03/19/2018  . 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 Aortic stenosis    a. 2014 s/p [facility]ch AVR ([facility])-->chronic coumadin; b. 08/2018 Echo: Mild AS/AI. Nl fxn/gradient.  . Arthritis   . Atrial fibrillation ([place])    . CAD (coronary artery disease)    a. 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 02/2018 MV [facility]): large inf, infsept, inflat infarct w/ min rev, EF 30%; c. 06/2018 Cath: LM min irregs, LAD 60p, 95/30/77m, LCX min irregs, RCA 40p, 16m, 90d, RPAV 90, RPDA 95ost; d. 09/2018 CABGx3 (LIMA->LAD, VG->D1, VG->PDA).  . Chronic anticoagulation 06/2012   a. Coumadin in the setting of mech AVR and afib.  Marland Kitchen COVID-19 03/15/2019  . CPAP (continuous positive airway pressure) dependence   . Dialysis patient [facility]) 04/07/2006   Patient started HD around 1996,  had 1st transplant LLQ around 1998 until 2007 when they found renal cell cancer in transplant and both native kidneys, all were removed > went back on HD until 2nd transplant RLQ in 2014 which lasted 3 yrs; it was embolized for suspected acute rejection. Is currently on HD MWF in [place] w/ [facility] nephrology.  . ESRD (end stage renal disease) ([place])    a. 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 1999 s/p Kidney transplant; c. 2014 Bilat nephrectomy (transplanted kidney resected) in the setting of renal cell carcinoma; d. PD until 11/2017-->HD.  Marland Kitchen Heart murmur   . HFrEF (heart failure with reduced ejection fraction) ([place])    a. 07/2016 Echo: EF 55-60%; b. 02/2018 MV: EF  30%; c. 04/2018 Echo: EF 35%; d. 08/2018 Echo: EF 30-35%. Glob HK w/o rwma. Mildly reduced RV fxn. Mod to sev dil LA. Nl AoV fxn.  Marland Kitchen Hx of colonoscopy    2009 by Dr. Laural Golden. Normal except for small submucosal lipoma. No evidence of polyps or colitis.  . Hyperlipidemia   . Hypertension   . Hypogonadism in male 07/25/2015  . Ischemic cardiomyopathy    a. 07/2016 Echo: EF 55-60%; b. 02/2018 MV: EF 30%; c. 04/2018 Echo: EF 35%; d. 08/2018 Echo: EF 30-35%.  . OSA (obstructive sleep apnea)    No CPAP  . Permanent atrial fibrillation (HCC)    a. CHA2DS2VASc = 3-->Coumadin (also mech AVR); b. 06/2018 Amio started 2/2 elevated rates.  . Renal  cell cancer ([place])   . Renal cell carcinoma ([place]) 08/04/2016    Assessment/Plan: 2 Days Post-Op Procedure(s) (LRB): KNEE ARTHROSCOPY WITH INCISION AND DRAINAGE (Right) Principal Problem:   Sepsis ([place]) Active Problems:   ESRD (end stage renal disease) (HCC)   CAD (coronary artery disease)   Hyperlipidemia   Anemia in chronic kidney disease   Atrial fibrillation (HCC)   Aortic valve disorder   Below-knee amputation of left lower extremity (HCC)   Septic arthritis ([place])  Estimated body mass index is 24.01 kg/m as calculated from the following:   Height as of this encounter: 5\' 10"  (1.778 m).   Weight as of this encounter: 75.9 kg. Advance diet Up with therapy Continue ABX therapy due to Post-op infection  DVT Prophylaxis - Coumadin   Reche Dixon, PA-C Orthopaedic Surgery 07/04/2020, 6:39 AM

## 2020-07-04 NOTE — Plan of Care (Signed)
  Problem: Education: Goal: Knowledge of General Education information will improve Description: Including pain rating scale, medication(s)/side effects and non-pharmacologic comfort measures 07/04/2020 1245 by Cristela Blue, RN Outcome: Progressing 07/04/2020 1245 by Cristela Blue, RN Outcome: Progressing   Problem: Clinical Measurements: Goal: Will remain free from infection 07/04/2020 1245 by Cristela Blue, RN Outcome: Progressing 07/04/2020 1245 by Cristela Blue, RN Outcome: Progressing Goal: Diagnostic test results will improve 07/04/2020 1245 by Cristela Blue, RN Outcome: Progressing 07/04/2020 1245 by Cristela Blue, RN Outcome: Progressing   Problem: Activity: Goal: Risk for activity intolerance will decrease 07/04/2020 1245 by Cristela Blue, RN Outcome: Progressing 07/04/2020 1245 by Cristela Blue, RN Outcome: Progressing

## 2020-07-04 NOTE — Progress Notes (Signed)
[place] for Warfarin  Indication: atrial fibrillation & mech AVR.   Allergies  Allergen Reactions  . Statins Other (See Comments)    Arthralgias (intolerance), Myalgias (intolerance)  Pt taking pravastatin   . Gabapentin     Drowsy   . Sertraline Rash    Patient Measurements: Height: 5\' 10"  (177.8 cm) Weight: 75.9 kg (167 lb 5.3 oz) IBW/kg (Calculated) : 73  Vital Signs: Temp: 99.6 F (37.6 C) (03/30 0557) Temp Source: Oral (03/30 0557) BP: 134/82 (03/30 0557) Pulse Rate: 109 (03/30 0557)  Labs: Recent Labs    07/02/20 1429 07/02/20 1537 07/03/20 0632 07/04/20 0544  HGB 9.6*  --  9.2* 9.3*  HCT 31.4*  --  30.2* 30.6*  PLT 125*  --  95* 104*  LABPROT  --  33.2* 34.9* 40.2*  INR  --  3.4* 3.6* 4.3*  CREATININE 4.21*  --  5.56* 7.59*    Estimated Creatinine Clearance: 10.3 mL/min (A) (by C-G formula based on SCr of 7.59 mg/dL (H)).   Medical History: Past Medical History:  Diagnosis Date  . Abnormal stress test 03/19/2018  . 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 Aortic stenosis    a. 2014 s/p [facility]ch AVR ([facility])-->chronic coumadin; b. 08/2018 Echo: Mild AS/AI. Nl fxn/gradient.  . Arthritis   . Atrial fibrillation ([place])   . CAD (coronary artery disease)    a. 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 02/2018 MV [facility]): large inf, infsept, inflat infarct w/ min rev, EF 30%; c. 06/2018 Cath: LM min irregs, LAD 60p, 95/30/64m, LCX min irregs, RCA 40p, 23m, 90d, RPAV 90, RPDA 95ost; d. 09/2018 CABGx3 (LIMA->LAD, VG->D1, VG->PDA).  . Chronic anticoagulation 06/2012   a. Coumadin in the setting of mech AVR and afib.  Marland Kitchen COVID-19 03/15/2019  . CPAP (continuous positive airway pressure) dependence   . Dialysis patient [facility]) 04/07/2006   Patient started HD around 1996,  had 1st transplant LLQ around 1998 until 2007 when they found renal cell cancer in transplant and both  native kidneys, all were removed > went back on HD until 2nd transplant RLQ in 2014 which lasted 3 yrs; it was embolized for suspected acute rejection. Is currently on HD MWF in [place] w/ [facility] nephrology.  . ESRD (end stage renal disease) ([place])    a. 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 1999 s/p Kidney transplant; c. 2014 Bilat nephrectomy (transplanted kidney resected) in the setting of renal cell carcinoma; d. PD until 11/2017-->HD.  Marland Kitchen Heart murmur   . HFrEF (heart failure with reduced ejection fraction) ([place])    a. 07/2016 Echo: EF 55-60%; b. 02/2018 MV: EF 30%; c. 04/2018 Echo: EF 35%; d. 08/2018 Echo: EF 30-35%. Glob HK w/o rwma. Mildly reduced RV fxn. Mod to sev dil LA. Nl AoV fxn.  Marland Kitchen Hx of colonoscopy    2009 by Dr. Laural Golden. Normal except for small submucosal lipoma. No evidence of polyps or colitis.  . Hyperlipidemia   . Hypertension   . Hypogonadism in male 07/25/2015  . Ischemic cardiomyopathy    a. 07/2016 Echo: EF 55-60%; b. 02/2018 MV: EF 30%; c. 04/2018 Echo: EF 35%; d. 08/2018 Echo: EF 30-35%.  . OSA (obstructive sleep apnea)    No CPAP  . Permanent atrial fibrillation (HCC)    a. CHA2DS2VASc = 3-->Coumadin (also mech AVR); b. 06/2018 Amio started 2/2 elevated  rates.  . Renal cell cancer ([place])   . Renal cell carcinoma ([place]) 08/04/2016    Medications:  Medications Prior to Admission  Medication Sig Dispense Refill Last Dose  . acetaminophen (TYLENOL) 500 MG tablet Take 1 tablet (500 mg total) by mouth every 4 (four) hours as needed. 30 tablet 0 07/02/2020 at Unknown time  . allopurinol (ZYLOPRIM) 100 MG tablet Take 1 tablet (100 mg total) by mouth daily. 30 tablet 0 07/02/2020 at Unknown time  . CHOLECALCIFEROL PO Take by mouth.   07/02/2020 at Unknown time  . clopidogrel (PLAVIX) 75 MG tablet Take 1 tablet (75 mg total) by mouth daily with breakfast. 90 tablet 3 07/02/2020 at Unknown time  . ezetimibe (ZETIA) 10 MG tablet TAKE 1 TABLET BY MOUTH EVERY DAY (Patient taking  differently: Take 10 mg by mouth daily.) 90 tablet 1 07/02/2020 at Unknown time  . hydroxychloroquine (PLAQUENIL) 200 MG tablet Take 400 mg by mouth at bedtime.   07/02/2020 at Unknown time  . lamoTRIgine (LAMICTAL) 25 MG tablet Take 2 tablets (50 mg total) by mouth 2 (two) times daily. 120 tablet 0 07/02/2020 at Unknown time  . loperamide (IMODIUM A-D) 2 MG tablet Take 2 mg by mouth 4 (four) times daily as needed for diarrhea or loose stools.   Past Week at Unknown time  . megestrol (MEGACE) 40 MG/ML suspension TAKE 10 ML (400 MG TOTAL) BY MOUTH DAILY.   Past Week at Unknown time  . metoprolol tartrate 75 MG TABS Take 75 mg by mouth 2 (two) times daily. 180 tablet 3   . midodrine (PROAMATINE) 10 MG tablet TAKE 1 TABLET BY MOUTH 3 TIMES A WEEK AS DIRECTED TAKE 1 TAB BEFORE DIALYSIS MAY REPEAT ONCE AS NEED   07/02/2020 at Unknown time  . multivitamin (RENA-VIT) TABS tablet Take 1 tablet by mouth daily.   07/02/2020 at Unknown time  . oxyCODONE-acetaminophen (PERCOCET/ROXICET) 5-325 MG tablet Take 1 tablet by mouth every 4 (four) hours as needed.   07/02/2020 at Unknown time  . pantoprazole (PROTONIX) 40 MG tablet Take 1 tablet (40 mg total) by mouth daily. 90 tablet 3 07/02/2020 at Unknown time  . polyethylene glycol (MIRALAX / GLYCOLAX) 17 g packet Take 17 g by mouth daily as needed for mild constipation. 30 each 0 Past Month at Unknown time  . predniSONE (DELTASONE) 5 MG tablet Take 5 mg by mouth daily.   07/02/2020 at Unknown time  . rosuvastatin (CRESTOR) 40 MG tablet Take 1 tablet (40 mg total) by mouth daily at 6 PM. 90 tablet 3 07/02/2020 at Unknown time  . sevelamer carbonate (RENVELA) 800 MG tablet Take 3,200 mg by mouth 3 (three) times daily with meals.   07/02/2020 at Unknown time  . traZODone (DESYREL) 50 MG tablet TAKE 1/2 -1 TABLET (25-50 MG TOTAL) BY MOUTH AT BEDTIME AS NEEDED FOR SLEEP. (Patient taking differently: Take 25-50 mg by mouth at bedtime.) 90 tablet 2 07/02/2020 at Unknown time  .  warfarin (COUMADIN) 1 MG tablet Take 1 to 2 tablets daily as directed by the coumadin clinic (Patient taking differently: Take 1 mg by mouth daily.) 90 tablet 0 07/02/2020 at bedtime  . Epoetin Alfa-epbx (RETACRIT IJ) Epoetin alfa - epbx (Retacrit)     . iron sucrose in sodium chloride 0.9 % 100 mL Iron Sucrose (Venofer)     . lidocaine-prilocaine (EMLA) cream Apply 1 application topically daily as needed (for fistula).     . nitroGLYCERIN (NITROSTAT) 0.4 MG SL tablet Place  1 tablet (0.4 mg total) under the tongue every 5 (five) minutes as needed for chest pain. 25 tablet 3     Assessment: Pharmacy consulted to dose warfarin for Afib in this 65 year old male admitted with septic arthritis.  Pt is s/p I&D of septic knee on 3/28. No major DDI. Unsure reason for INR trending up.   Home regimen: per last communication for warfarin dosing: 1 mg daily.    Date INR Warfarin Dose  3/28 3.4 HOLD  3/29 3.6 HOLD  3/30 4.3 HOLD    Goal of Therapy:  INR 2-3 Monitor platelets by anticoagulation protocol: Yes   Plan:  INR is supratherapeutic. Will hold warfarin dose tonight. Daily INR ordered. CBC at least every 3 days. Discussed potenitally giving PO vit K with MD. Plan to hold and watch levels for now.   Oswald Hillock, PharmD, BCPS 07/04/2020,9:02 AM

## 2020-07-04 NOTE — Progress Notes (Signed)
PROGRESS NOTE   HPI was taken from Dr. Jonelle Sidle: Jeremy [name] is a 65 y.o. 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 Katheran Awe, patient presented to the ER with significant right knee swelling pain fever and tenderness.  He meets sepsis criteria.  Patient also has significant mild distress.  Seen in the ER and evaluated.  The joint appears swollen tender and septic.  Aspiration showed significant leukocytosis mainly neutrophils, no crystals.  Patient is afebrile.  Orthopedics consulted and patient has had surgery and just completed procedure.  Patient is being admitted to the hospital for continued antibiotic and treatment.  ED Course: Temperature is 100.8 blood pressure is 86/59 pulse 127 respiratory rate of 28 oxygen sats 94% room air.  Synovial fluid is turbid with 65,000 white cell 98% neutrophils.  X-ray of the knee shows small suprapatellar joint effusions no fractures.  Sodium 130 potassium 4 (2 glucose 95 BUN 24 creatinine 4.2 lactic acid is 2.6.  Hemoglobin 9.6.  INR of 3.4.  Patient is admitted for further evaluation and treatment     KAO CONRY  WIO:[id number] DOB: 1955-04-28 DOA: 07/02/2020 PCP: Leone Haven, MD   Assessment & Plan:   Principal Problem:   Sepsis [facility]) Active Problems:   ESRD (end stage renal disease) ([place])   CAD (coronary artery disease)   Hyperlipidemia   Anemia in chronic kidney disease   Atrial fibrillation ([place])   Aortic valve disorder   Below-knee amputation of left lower extremity ([place])   Septic arthritis ([place])   Severe sepsis:  Present on admission - meets criteria w/ fever, tachycardia, tachypnea and likely septic  arthritis. Continue on IV ceftriaxone, vanco. Right knee cx is pending. Blood cxs NGTD. Ortho following  Right knee septic arthritis: s/p surgery as per ortho surg on 07/02/20. Continue on IV ceftriaxone, vanco.   Right knee cxs are pending. ID recommends continuing antibiotics for now and wait for cultures.   ESRD: on HD MWF. S/p previous kidney transplant. Nephro following for dialysis need  Hyperkalemia: will be managed w/ HD -potassium 6.3 today.  Discussed with nephrology who will handle dialysis. recheck potassium postdialysis  Macrocytic anemia: Normal B12 B12 & folate levels pending. No need for a transfusion currently  Thrombocytopenia: etiology unclear. Will continue to monitor   PVD: s/p right BKA in Jan 2022.   CAD: stable. table.   Likely PAF: continue on coumadin. monitor INR daily    S/p atrial valve placement: continue on coumadin.  INR 4.3.  No need of vitamin K as patient is not bleeding  Constipation: No bowel movement since last Saturday Start Senokot and MiraLAX on 3/30.  Consider stopping or change to as needed after he has bowel movement  Resuming lamotrigine/Lamictal 50 mg oral twice a day which is his home medication on 3/30.  I am not to clear off indication but he has been taking this at home chronically   DVT prophylaxis: SCDs, INR 4.3 Code Status: full  Family Communication:  Disposition Plan: depends on PT/OT recs -consulted today on 3/30  Level of care: Progressive Cardiac   Status is: Inpatient  Remains inpatient appropriate because:Unsafe d/c plan, IV treatments appropriate due to intensity of illness or inability to take PO and  Inpatient level of care appropriate due to severity of illness   Dispo: The patient is from: Home              Anticipated d/c is to: Home              Patient currently is not medically stable to d/c.   Difficult to place patient Yes    Consultants:   Ortho surg  ID    Procedures:    Antimicrobials:  vanco, ceftriaxone    Subjective His pain is well controlled but reports constipation.  no bowel movement since admission.  Objective: Vitals:   07/04/20 1315 07/04/20 1330 07/04/20 1345 07/04/20 1548  BP: 122/81 132/89 136/86 134/75  Pulse: (!) 129 (!) 130 (!) 132 (!) 122  Resp: 16 14 16 18   Temp:    98 F (36.7 C)  TempSrc:    Oral  SpO2:    96%  Weight:      Height:        Intake/Output Summary (Last 24 hours) at 07/04/2020 1655 Last data filed at 07/04/2020 1345 Gross per 24 hour  Intake 331.08 ml  Output 2020 ml  Net -1688.92 ml   Filed Weights   07/02/20 1424 07/03/20 0622 07/04/20 0600  Weight: 73.5 kg 73.7 kg 75.9 kg    Examination:  General exam: Appears calm and comfortable  Respiratory system: Clear to auscultation. Respiratory effort normal. Cardiovascular system: S1 & S2 +. No rubs, gallops or clicks.  Gastrointestinal system: Abdomen is nondistended, soft and nontender. Normal bowel sounds heard. Central nervous system: Alert and oriented. Moves all extremities  Psychiatry: Judgement and insight appear normal. Mood & affect appropriate.  Extremities: See picture      Data Reviewed: I have personally reviewed following labs and imaging studies  CBC: Recent Labs  Lab 07/02/20 1429 07/03/20 0632 07/04/20 0544  WBC 7.5 6.3 7.3  NEUTROABS 5.4  --   --   HGB 9.6* 9.2* 9.3*  HCT 31.4* 30.2* 30.6*  MCV 104.3* 108.6* 105.9*  PLT 125* 95* 203*   Basic Metabolic Panel: Recent Labs  Lab 07/02/20 1429 07/03/20 0632 07/04/20 0544  NA 138 134* 132*  K 4.1 5.6* 6.3*  CL 94* 94* 91*  CO2 30 28 26   GLUCOSE 95 88 90  BUN 24* 36* 57*  CREATININE 4.21* 5.56* 7.59*  CALCIUM 8.7* 9.0 9.7   GFR: Estimated Creatinine Clearance: 10.3 mL/min (A) (by C-G formula based on SCr of 7.59 mg/dL (H)). Liver Function Tests: Recent Labs  Lab 07/02/20 1429 07/03/20 0632  AST 25 20  ALT 18 14  ALKPHOS 127* 97  BILITOT 1.0 0.8  PROT 7.6 6.5  ALBUMIN 3.5  2.9*   No results for input(s): LIPASE, AMYLASE in the last 168 hours. No results for input(s): AMMONIA in the last 168 hours. Coagulation Profile: Recent Labs  Lab 07/02/20 1537 07/03/20 0632 07/04/20 0544  INR 3.4* 3.6* 4.3*   Cardiac Enzymes: No results for input(s): CKTOTAL, CKMB, CKMBINDEX, TROPONINI in the last 168 hours. BNP (last 3 results) No results for input(s): PROBNP in the last 8760 hours. HbA1C: No results for input(s): HGBA1C in the last 72 hours. CBG: No results for input(s): GLUCAP in the last 168 hours. Lipid Profile: No results for input(s): CHOL, HDL, LDLCALC, TRIG, CHOLHDL, LDLDIRECT in the last 72 hours. Thyroid Function Tests: No results for input(s): TSH, T4TOTAL, FREET4, T3FREE, THYROIDAB in the last 72 hours. Anemia Panel: Recent Labs    07/04/20 0544  VITAMINB12 251  FOLATE 36.0   Sepsis Labs: Recent Labs  Lab 07/02/20 1429 07/02/20 1537 07/02/20 1750 07/03/20 0632  PROCALCITON  --   --   --  2.39  LATICACIDVEN 2.0* 2.6* 1.3  --     Recent Results (from the past 240 hour(s))  Blood culture (single)     Status: None (Preliminary result)   Collection Time: 07/02/20  2:36 PM   Specimen: BLOOD  Result Value Ref Range Status   Specimen Description BLOOD BLOOD RIGHT FOREARM  Final   Special Requests   Final    BOTTLES DRAWN AEROBIC AND ANAEROBIC Blood Culture adequate volume   Culture   Final    NO GROWTH 2 DAYS Performed at [facility], [address] [place], [place] [address]    Report Status PENDING  Incomplete  Blood culture (single)     Status: None (Preliminary result)   Collection Time: 07/02/20  3:37 PM   Specimen: BLOOD  Result Value Ref Range Status   Specimen Description BLOOD RIGHT ANTECUBITAL  Final   Special Requests   Final    BOTTLES DRAWN AEROBIC AND ANAEROBIC Blood Culture adequate volume   Culture   Final    NO GROWTH 2 DAYS Performed at [facility], [address], [place], [place]  [address]    Report Status PENDING  Incomplete  Resp Panel by RT-PCR (Flu A&B, Covid) Nasopharyngeal Swab     Status: None   Collection Time: 07/02/20  3:37 PM   Specimen: Nasopharyngeal Swab; Nasopharyngeal(NP) swabs in vial transport medium  Result Value Ref Range Status   SARS Coronavirus 2 by RT PCR NEGATIVE NEGATIVE Final    Comment: (NOTE) SARS-CoV-2 target nucleic acids are NOT DETECTED.  The SARS-CoV-2 RNA is generally detectable in upper respiratory specimens during the acute phase of infection. The lowest concentration of SARS-CoV-2 viral copies this assay can detect is 138 copies/mL. A negative result does not preclude SARS-Cov-2 infection and should not be used as the sole basis for treatment or other patient management decisions. A negative result may occur with  improper specimen collection/handling, submission of specimen other than nasopharyngeal swab, presence of viral mutation(s) within the areas targeted by this assay, and inadequate number of viral copies(<138 copies/mL). A negative result must be combined with clinical observations, patient history, and epidemiological information. The expected result is Negative.  Fact Sheet for Patients:  EntrepreneurPulse.com.au  Fact Sheet for Healthcare Providers:  IncredibleEmployment.be  This test is no t yet approved or cleared by the [place] FDA and  has been authorized for detection and/or diagnosis of SARS-CoV-2 by FDA under an Emergency Use Authorization (EUA). This EUA will remain  in effect (meaning this test can be used) for the duration of the COVID-19 declaration under Section 564(b)(1) of the Act, 21 U.S.C.section 360bbb-3(b)(1), unless the authorization is terminated  or revoked sooner.       Influenza A by PCR NEGATIVE NEGATIVE Final   Influenza B by PCR NEGATIVE NEGATIVE Final    Comment: (NOTE) The Xpert Xpress SARS-CoV-2/FLU/RSV plus assay is intended as an  aid in the diagnosis of influenza from Nasopharyngeal swab specimens and should not be used as a sole basis for treatment. Nasal washings and aspirates are unacceptable for Xpert Xpress SARS-CoV-2/FLU/RSV testing.  Fact Sheet for Patients: EntrepreneurPulse.com.au  Fact Sheet for Healthcare Providers: IncredibleEmployment.be  This test is not yet approved or cleared by the [place] FDA and has been authorized for detection and/or diagnosis  of SARS-CoV-2 by FDA under an Emergency Use Authorization (EUA). This EUA will remain in effect (meaning this test can be used) for the duration of the COVID-19 declaration under Section 564(b)(1) of the Act, 21 U.S.C. section 360bbb-3(b)(1), unless the authorization is terminated or revoked.  Performed at [facility], [address] [place], [place] [address]   Aerobic/Anaerobic Culture w Gram Stain (surgical/deep wound)     Status: None (Preliminary result)   Collection Time: 07/02/20  9:50 PM   Specimen: PATH Other; Body Fluid  Result Value Ref Range Status   Specimen Description KNEE RIGHT JOINT  Final   Special Requests ID 1 PATIENT ON FOLLOWING VANC,ROCEPHIN SWAB  Final   Gram Stain   Final    FEW WBC PRESENT, PREDOMINANTLY PMN NO ORGANISMS SEEN    Culture   Final    NO GROWTH 1 DAY Performed at [facility], [address] [place], [place] [address]    Report Status PENDING  Incomplete  Aerobic/Anaerobic Culture w Gram Stain (surgical/deep wound)     Status: None (Preliminary result)   Collection Time: 07/02/20  9:51 PM   Specimen: PATH Other; Body Fluid  Result Value Ref Range Status   Specimen Description KNEE RIGHT JOINT  Final   Special Requests ID 2 SWAB  Final   Gram Stain MODERATE NO ORGANISMS SEEN   Final   Culture   Final    NO GROWTH 1 DAY Performed at [facility], [address] [place], [place] [address]    Report Status PENDING  Incomplete          Radiology Studies: No results found.      Scheduled Meds: . Chlorhexidine Gluconate Cloth  6 each Topical Q0600  . epoetin (EPOGEN/PROCRIT) injection  10,000 Units Intravenous Q M,W,F-HD  . lamoTRIgine  50 mg Oral BID  . metoprolol tartrate  50 mg Oral BID  . sevelamer carbonate  800 mg Oral TID WC  . traZODone  25-50 mg Oral QHS   Continuous Infusions: . cefTRIAXone (ROCEPHIN)  IV 2 g (07/04/20 1613)  . vancomycin 750 mg (07/04/20 1245)     LOS: 2 days    Time spent: 33 mins     Teal Bontrager Manuella Ghazi, MD [facility] Pager [phone]  If 7PM-7AM, please contact night-coverage 07/04/2020, 4:55 PM

## 2020-07-04 NOTE — Progress Notes (Addendum)
[name], [name] 07/04/20  Subjective:   LOS: 2 Jeremy [name] is a 65 y.o. male with past medical history of ESRD on HD, s/p failed kidney transplant, CAD, A fib, obstructive sleep apnea on CPAP, and PVD with Rt BKA. He presents to the ED with right knee swelling and pain.   He is admitted for further workup of right knee pain  Patient seen resting in bed Alert and oriented Continues to complains of discomfort in right knee Denies nausea and shortness of breath  Patient seen later during dialysis  Objective:  Vital signs in last 24 hours:  Temp:  [98.4 F (36.9 C)-99.9 F (37.7 C)] 99.6 F (37.6 C) (03/30 0557) Pulse Rate:  [101-118] 109 (03/30 0557) Resp:  [16-20] 16 (03/30 0557) BP: (111-134)/(72-82) 134/82 (03/30 0557) SpO2:  [90 %-100 %] 90 % (03/30 0557) Weight:  [75.9 kg] 75.9 kg (03/30 0600)  Weight change: 2.4 kg Filed Weights   07/02/20 1424 07/03/20 0622 07/04/20 0600  Weight: 73.5 kg 73.7 kg 75.9 kg    Intake/Output:    Intake/Output Summary (Last 24 hours) at 07/04/2020 1008 Last data filed at 07/04/2020 0604 Gross per 24 hour  Intake 671.08 ml  Output 20 ml  Net 651.08 ml     Physical Exam: General: No acute distress  HEENT Normocephalic, moist oral mucosa  Pulm/lungs Clear lungs  CVS/Heart S1S2 present  Abdomen:  Soft, nontener  Extremities: No peripheral edema, Rt BKA, Rt knee ace bandaged  Neurologic: Alert, oriented  Skin: No masses or rashes  Access: Lt AVF        Basic Metabolic Panel:  Recent Labs  Lab 07/02/20 1429 07/03/20 0632 07/04/20 0544  NA 138 134* 132*  K 4.1 5.6* 6.3*  CL 94* 94* 91*  CO2 30 28 26   GLUCOSE 95 88 90  BUN 24* 36* 57*  CREATININE 4.21* 5.56* 7.59*  CALCIUM 8.7* 9.0 9.7     CBC: Recent Labs  Lab 07/02/20 1429 07/03/20 0632 07/04/20 0544  WBC 7.5 6.3 7.3  NEUTROABS 5.4  --   --   HGB 9.6* 9.2* 9.3*  HCT 31.4* 30.2* 30.6*  MCV 104.3* 108.6* 105.9*  PLT 125* 95*  104*      Lab Results  Component Value Date   HEPBSAG NON REACTIVE 04/13/2020      Microbiology:  Recent Results (from the past 240 hour(s))  Blood culture (single)     Status: None (Preliminary result)   Collection Time: 07/02/20  2:36 PM   Specimen: BLOOD  Result Value Ref Range Status   Specimen Description BLOOD BLOOD RIGHT FOREARM  Final   Special Requests   Final    BOTTLES DRAWN AEROBIC AND ANAEROBIC Blood Culture adequate volume   Culture   Final    NO GROWTH 2 DAYS Performed at [facility], [address] [place], [place] [address]    Report Status PENDING  Incomplete  Blood culture (single)     Status: None (Preliminary result)   Collection Time: 07/02/20  3:37 PM   Specimen: BLOOD  Result Value Ref Range Status   Specimen Description BLOOD RIGHT ANTECUBITAL  Final   Special Requests   Final    BOTTLES DRAWN AEROBIC AND ANAEROBIC Blood Culture adequate volume   Culture   Final    NO GROWTH 2 DAYS Performed at [facility], [address], [place],  [address]    Report Status PENDING  Incomplete  Resp Panel by  RT-PCR (Flu A&B, Covid) Nasopharyngeal Swab     Status: None   Collection Time: 07/02/20  3:37 PM   Specimen: Nasopharyngeal Swab; Nasopharyngeal(NP) swabs in vial transport medium  Result Value Ref Range Status   SARS Coronavirus 2 by RT PCR NEGATIVE NEGATIVE Final    Comment: (NOTE) SARS-CoV-2 target nucleic acids are NOT DETECTED.  The SARS-CoV-2 RNA is generally detectable in upper respiratory specimens during the acute phase of infection. The lowest concentration of SARS-CoV-2 viral copies this assay can detect is 138 copies/mL. A negative result does not preclude SARS-Cov-2 infection and should not be used as the sole basis for treatment or other patient management decisions. A negative result may occur with  improper specimen collection/handling, submission of specimen other than nasopharyngeal swab, presence of  viral mutation(s) within the areas targeted by this assay, and inadequate number of viral copies(<138 copies/mL). A negative result must be combined with clinical observations, patient history, and epidemiological information. The expected result is Negative.  Fact Sheet for Patients:  EntrepreneurPulse.com.au  Fact Sheet for Healthcare Providers:  IncredibleEmployment.be  This test is no t yet approved or cleared by the [place] FDA and  has been authorized for detection and/or diagnosis of SARS-CoV-2 by FDA under an Emergency Use Authorization (EUA). This EUA will remain  in effect (meaning this test can be used) for the duration of the COVID-19 declaration under Section 564(b)(1) of the Act, 21 U.S.C.section 360bbb-3(b)(1), unless the authorization is terminated  or revoked sooner.       Influenza A by PCR NEGATIVE NEGATIVE Final   Influenza B by PCR NEGATIVE NEGATIVE Final    Comment: (NOTE) The Xpert Xpress SARS-CoV-2/FLU/RSV plus assay is intended as an aid in the diagnosis of influenza from Nasopharyngeal swab specimens and should not be used as a sole basis for treatment. Nasal washings and aspirates are unacceptable for Xpert Xpress SARS-CoV-2/FLU/RSV testing.  Fact Sheet for Patients: EntrepreneurPulse.com.au  Fact Sheet for Healthcare Providers: IncredibleEmployment.be  This test is not yet approved or cleared by the [place] FDA and has been authorized for detection and/or diagnosis of SARS-CoV-2 by FDA under an Emergency Use Authorization (EUA). This EUA will remain in effect (meaning this test can be used) for the duration of the COVID-19 declaration under Section 564(b)(1) of the Act, 21 U.S.C. section 360bbb-3(b)(1), unless the authorization is terminated or revoked.  Performed at [facility], [address] [place], [place] [address]   Aerobic/Anaerobic Culture  w Gram Stain (surgical/deep wound)     Status: None (Preliminary result)   Collection Time: 07/02/20  9:50 PM   Specimen: PATH Other; Body Fluid  Result Value Ref Range Status   Specimen Description KNEE RIGHT JOINT  Final   Special Requests ID 1 PATIENT ON FOLLOWING VANC,ROCEPHIN SWAB  Final   Gram Stain   Final    FEW WBC PRESENT, PREDOMINANTLY PMN NO ORGANISMS SEEN    Culture   Final    NO GROWTH 1 DAY Performed at [facility], [address] [place], [place] [address]    Report Status PENDING  Incomplete  Aerobic/Anaerobic Culture w Gram Stain (surgical/deep wound)     Status: None (Preliminary result)   Collection Time: 07/02/20  9:51 PM   Specimen: PATH Other; Body Fluid  Result Value Ref Range Status   Specimen Description KNEE RIGHT JOINT  Final   Special Requests ID 2 SWAB  Final   Gram Stain MODERATE NO ORGANISMS SEEN   Final  Culture   Final    NO GROWTH 1 DAY Performed at [facility], [address] [place], [place] [address]    Report Status PENDING  Incomplete    Coagulation Studies: Recent Labs    07/02/20 1537 07/03/20 0632 07/04/20 0544  LABPROT 33.2* 34.9* 40.2*  INR 3.4* 3.6* 4.3*    Urinalysis: No results for input(s): COLORURINE, LABSPEC, PHURINE, GLUCOSEU, HGBUR, BILIRUBINUR, KETONESUR, PROTEINUR, UROBILINOGEN, NITRITE, LEUKOCYTESUR in the last 72 hours.  Invalid input(s): APPERANCEUR    Imaging: DG Chest 2 View  Result Date: 07/02/2020 CLINICAL DATA:  Fever, weakness. EXAM: CHEST - 2 VIEW COMPARISON:  Chest x-ray 04/17/2020, CT chest 04/19/2020 FINDINGS: Similar-appearing cardiac silhouette enlargement. The heart size and mediastinal contours are unchanged. Aortic arch calcifications. Redemonstration of aortic and tricuspid valve replacement. Biapical pleural/pulmonary scarring. No focal consolidation. Question Kerley B lines along the left lower lobe. No definite increased interstitial markings. No pleural effusion. No  pneumothorax. No acute osseous abnormality.  Old healed right rib fractures. IMPRESSION: Question Kerley B lines along the left lower lobe suggestive of pulmonary edema. Electronically Signed   By: Iven Finn M.D.   On: 07/02/2020 15:25   DG Knee Right Port  Result Date: 07/02/2020 CLINICAL DATA:  Right knee pain and swelling. EXAM: PORTABLE RIGHT KNEE - 1-2 VIEW COMPARISON:  May 14, 2017. FINDINGS: Status post right below-knee amputation. Vascular calcifications are noted. No acute fracture or dislocation is noted. Small suprapatellar joint effusion is noted. IMPRESSION: Small suprapatellar joint effusion. No acute fracture or dislocation is noted. Electronically Signed   By: Marijo Conception M.D.   On: 07/02/2020 16:29     Medications:   . cefTRIAXone (ROCEPHIN)  IV 2 g (07/03/20 1751)  . vancomycin     . Chlorhexidine Gluconate Cloth  6 each Topical Q0600  . epoetin (EPOGEN/PROCRIT) injection  10,000 Units Intravenous Q M,W,F-HD  . metoprolol tartrate  50 mg Oral BID  . sevelamer carbonate  800 mg Oral TID WC  . traZODone  25-50 mg Oral QHS   acetaminophen **OR** acetaminophen, morphine injection, ondansetron **OR** ondansetron (ZOFRAN) IV, oxyCODONE-acetaminophen  Assessment/ Plan:  65 y.o. male with  was admitted on 07/02/2020 for  Principal Problem:   Sepsis ([place]) Active Problems:   ESRD (end stage renal disease) ([place])   CAD (coronary artery disease)   Hyperlipidemia   Anemia in chronic kidney disease   Atrial fibrillation ([facility])   Aortic valve disorder   Below-knee amputation of left lower extremity ([facility])   Septic arthritis ([facility])  Pyogenic arthritis of right knee joint, due to unspecified organism ([place]) [M00.9] Sepsis without acute organ dysfunction, due to unspecified organism ([place]) [A41.9] Sepsis ([place]) [A41.9]   [facility] [address], MWF/ Lt AVF  #. ESRD on HD -Receiving dialysis today -UF goal 2L -next treatment scheduled for Friday - placed on Renal  diet  #. Anemia of Chronic kidney disease  Lab Results  Component Value Date   HGB 9.3 (L) 07/04/2020   EPO prescribed as outpatient Continue EPO with HD   #. Secondary hyperparathyroidism of renal origin N 25.81      Component Value Date/Time   PTH 141 (H) 09/30/2019 2023   PTH Comment 12/14/2015 1059   Lab Results  Component Value Date   PHOS 6.1 (H) 05/03/2020  Sevelamer given with meals Monitor calcium and phos level during this admission   #. Diabetes type 2 with CKD Hgb A1c MFr Bld (%)  Date Value  09/15/2018  5.9 (H)  Glucose stable  # Hyperkalemia Current level 6.3 This will correct with dialysis   LOS: [place] 3/30/202210:08 AM  [place], [place]

## 2020-07-04 NOTE — Progress Notes (Signed)
Spoke with Shae from [facility] at 1055 to put patient in room 206 bed 2.

## 2020-07-04 NOTE — Progress Notes (Signed)
Date of Admission:  07/02/2020      ID: Jeremy [name] is a 65 y.o. male  Principal Problem:   Sepsis ([place]) Active Problems:   ESRD (end stage renal disease) ([place])   CAD (coronary artery disease)   Hyperlipidemia   Anemia in chronic kidney disease   Atrial fibrillation (HCC)   Aortic valve disorder   Below-knee amputation of left lower extremity (HCC)   Septic arthritis (HCC)    Subjective: Pain rt knee No fever  Medications:  . Chlorhexidine Gluconate Cloth  6 each Topical Q0600  . epoetin (EPOGEN/PROCRIT) injection  10,000 Units Intravenous Q M,W,F-HD  . metoprolol tartrate  50 mg Oral BID  . sevelamer carbonate  800 mg Oral TID WC  . traZODone  25-50 mg Oral QHS    Objective: Vital signs in last 24 hours: Patient Vitals for the past 24 hrs:  BP Temp Temp src Pulse Resp SpO2 Weight  07/04/20 1215 126/78 -- -- (!) 129 16 -- --  07/04/20 1200 124/90 -- -- (!) 127 18 -- --  07/04/20 1145 119/79 -- -- (!) 127 16 -- --  07/04/20 1130 109/77 -- -- (!) 127 16 -- --  07/04/20 1115 109/74 -- -- (!) 125 13 -- --  07/04/20 1100 109/79 -- -- (!) 124 15 -- --  07/04/20 1041 119/78 98.3 F (36.8 C) Oral -- -- -- --  07/04/20 0600 -- -- -- -- -- -- 75.9 kg  07/04/20 0557 134/82 99.6 F (37.6 C) Oral (!) 109 16 90 % --  07/04/20 0006 112/82 98.4 F (36.9 C) Oral (!) 101 20 100 % --  07/03/20 2020 116/73 99.9 F (37.7 C) Oral (!) 117 16 95 % --  07/03/20 1600 111/72 99.4 F (37.4 C) Oral (!) 114 19 100 % --   PHYSICAL EXAM:  General: Alert, cooperative,  Lungs: Clear to auscultation bilaterally. No Wheezing or Rhonchi. No rales. Heart: Afib with RVR Abdomen: Soft, surgical scars Extremities: left AV fistula Rt BKA Stump has non healing wound covered with eschar Skin: No rashes or lesions. Or bruising Lymph: Cervical, supraclavicular normal. Neurologic: Grossly non-focal  Lab Results Recent Labs    07/03/20 0632 07/04/20 0544  WBC 6.3 7.3  HGB 9.2* 9.3*  HCT  30.2* 30.6*  NA 134* 132*  K 5.6* 6.3*  CL 94* 91*  CO2 28 26  BUN 36* 57*  CREATININE 5.56* 7.59*   Liver Panel Recent Labs    07/02/20 1429 07/03/20 0632  PROT 7.6 6.5  ALBUMIN 3.5 2.9*  AST 25 20  ALT 18 14  ALKPHOS 127* 97  BILITOT 1.0 0.8  Microbiology: 07/02/20 BC NG 07/02/20 - synovial fluid culture Studies/Results: DG Chest 2 View  Result Date: 07/02/2020 CLINICAL DATA:  Fever, weakness. EXAM: CHEST - 2 VIEW COMPARISON:  Chest x-ray 04/17/2020, CT chest 04/19/2020 FINDINGS: Similar-appearing cardiac silhouette enlargement. The heart size and mediastinal contours are unchanged. Aortic arch calcifications. Redemonstration of aortic and tricuspid valve replacement. Biapical pleural/pulmonary scarring. No focal consolidation. Question Ker[place] B lines along the left lower lobe. No definite increased interstitial markings. No pleural effusion. No pneumothorax. No acute osseous abnormality.  Old healed right rib fractures. IMPRESSION: Question [place] B lines along the left lower lobe suggestive of pulmonary edema. Electronically Signed   By: Iven Finn M.D.   On: 07/02/2020 15:25   DG Knee Right Port  Result Date: 07/02/2020 CLINICAL DATA:  Right knee pain and swelling. EXAM: PORTABLE RIGHT KNEE -  1-2 VIEW COMPARISON:  May 14, 2017. FINDINGS: Status post right below-knee amputation. Vascular calcifications are noted. No acute fracture or dislocation is noted. Small suprapatellar joint effusion is noted. IMPRESSION: Small suprapatellar joint effusion. No acute fracture or dislocation is noted. Electronically Signed   By: Marijo Conception M.D.   On: 07/02/2020 16:29     Assessment/Plan: Right knee septic arthritis.  Status post I&D and Synovectomy.  Currently on Vanco and ceftriaxone.  Awaiting cultures from the surgery. The source of this infection was very likely the infected BKA stump.  Recent BKA on 04/14/2020.  Stump still has residual vascular insufficiency with an eschar  and some drainage.  We will need to get vascular to see the patient and do debridement and revision of the stump as the right knee infection may not clear otherwise. 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  Discussed the management with patient

## 2020-07-05 ENCOUNTER — Encounter: Payer: Self-pay | Admitting: Internal Medicine

## 2020-07-05 DIAGNOSIS — Z992 Dependence on renal dialysis: Secondary | ICD-10-CM | POA: Diagnosis not present

## 2020-07-05 DIAGNOSIS — N186 End stage renal disease: Secondary | ICD-10-CM | POA: Diagnosis not present

## 2020-07-05 DIAGNOSIS — T8610 Unspecified complication of kidney transplant: Secondary | ICD-10-CM | POA: Diagnosis not present

## 2020-07-05 LAB — CBC
HCT: 28 % — ABNORMAL LOW (ref 39.0–52.0)
Hemoglobin: 8.7 g/dL — ABNORMAL LOW (ref 13.0–17.0)
MCH: 32.5 pg (ref 26.0–34.0)
MCHC: 31.1 g/dL (ref 30.0–36.0)
MCV: 104.5 fL — ABNORMAL HIGH (ref 80.0–100.0)
Platelets: 100 10*3/uL — ABNORMAL LOW (ref 150–400)
RBC: 2.68 MIL/uL — ABNORMAL LOW (ref 4.22–5.81)
RDW: 17.4 % — ABNORMAL HIGH (ref 11.5–15.5)
WBC: 5.2 10*3/uL (ref 4.0–10.5)
nRBC: 0 % (ref 0.0–0.2)

## 2020-07-05 LAB — C-REACTIVE PROTEIN: CRP: 24.8 mg/dL — ABNORMAL HIGH (ref <1.0)

## 2020-07-05 LAB — BASIC METABOLIC PANEL
Anion gap: 13 (ref 5–15)
BUN: 41 mg/dL — ABNORMAL HIGH (ref 8–23)
CO2: 27 mmol/L (ref 22–32)
Calcium: 9.4 mg/dL (ref 8.9–10.3)
Chloride: 96 mmol/L — ABNORMAL LOW (ref 98–111)
Creatinine, Ser: 6.07 mg/dL — ABNORMAL HIGH (ref 0.61–1.24)
GFR, Estimated: 10 mL/min — ABNORMAL LOW (ref >60)
Glucose, Bld: 104 mg/dL — ABNORMAL HIGH (ref 70–99)
Potassium: 5.5 mmol/L — ABNORMAL HIGH (ref 3.5–5.1)
Sodium: 136 mmol/L (ref 135–145)

## 2020-07-05 LAB — SEDIMENTATION RATE: Sed Rate: 66 mm/hr — ABNORMAL HIGH (ref 0–20)

## 2020-07-05 LAB — PROTIME-INR
INR: 3.8 — ABNORMAL HIGH (ref 0.8–1.2)
Prothrombin Time: 36.6 seconds — ABNORMAL HIGH (ref 11.4–15.2)

## 2020-07-05 MED ORDER — METOPROLOL TARTRATE 25 MG PO TABS
25.0000 mg | ORAL_TABLET | Freq: Once | ORAL | Status: AC
  Administered 2020-07-05: 25 mg via ORAL
  Filled 2020-07-05: qty 1

## 2020-07-05 MED ORDER — METOPROLOL TARTRATE 50 MG PO TABS
75.0000 mg | ORAL_TABLET | Freq: Two times a day (BID) | ORAL | Status: DC
  Administered 2020-07-05: 75 mg via ORAL
  Filled 2020-07-05: qty 1

## 2020-07-05 NOTE — Progress Notes (Signed)
[place] for Warfarin  Indication: atrial fibrillation & mech AVR.   Allergies  Allergen Reactions  . Statins Other (See Comments)    Arthralgias (intolerance), Myalgias (intolerance)  Pt taking pravastatin   . Gabapentin     Drowsy   . Sertraline Rash    Patient Measurements: Height: 5\' 10"  (177.8 cm) Weight: 76.1 kg (167 lb 12.3 oz) IBW/kg (Calculated) : 73  Vital Signs: Temp: 98.4 F (36.9 C) (03/31 1124) Temp Source: Oral (03/31 1124) BP: 119/80 (03/31 1124) Pulse Rate: 125 (03/31 1124)  Labs: Recent Labs    07/03/20 1761 07/04/20 0544 07/05/20 0611 07/05/20 0920  HGB 9.2* 9.3* 8.7*  --   HCT 30.2* 30.6* 28.0*  --   PLT 95* 104* 100*  --   LABPROT 34.9* 40.2*  --  36.6*  INR 3.6* 4.3*  --  3.8*  CREATININE 5.56* 7.59* 6.07*  --     Estimated Creatinine Clearance: 12.9 mL/min (A) (by C-G formula based on SCr of 6.07 mg/dL (H)).   Medical History: Past Medical History:  Diagnosis Date  . Abnormal stress test 03/19/2018  . 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 Aortic stenosis    a. 2014 s/p mech AVR ([facility])-->chronic coumadin; b. 08/2018 Echo: Mild AS/AI. Nl fxn/gradient.  . Arthritis   . Atrial fibrillation ([place])   . CAD (coronary artery disease)    a. 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 02/2018 MV [facility]): large inf, infsept, inflat infarct w/ min rev, EF 30%; c. 06/2018 Cath: LM min irregs, LAD 60p, 95/30/67m, LCX min irregs, RCA 40p, 56m, 90d, RPAV 90, RPDA 95ost; d. 09/2018 CABGx3 (LIMA->LAD, VG->D1, VG->PDA).  . Chronic anticoagulation 06/2012   a. Coumadin in the setting of mech AVR and afib.  Marland Kitchen COVID-19 03/15/2019  . CPAP (continuous positive airway pressure) dependence   . Dialysis patient [facility]) 04/07/2006   Patient started HD around 1996,  had 1st transplant LLQ around 1998 until 2007 when they found renal cell cancer in transplant and both  native kidneys, all were removed > went back on HD until 2nd transplant RLQ in 2014 which lasted 3 yrs; it was embolized for suspected acute rejection. Is currently on HD MWF in [place] w/ [facility] nephrology.  . ESRD (end stage renal disease) ([place])    a. 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 1999 s/p Kidney transplant; c. 2014 Bilat nephrectomy (transplanted kidney resected) in the setting of renal cell carcinoma; d. PD until 11/2017-->HD.  Marland Kitchen Heart murmur   . HFrEF (heart failure with reduced ejection fraction) ([place])    a. 07/2016 Echo: EF 55-60%; b. 02/2018 MV: EF 30%; c. 04/2018 Echo: EF 35%; d. 08/2018 Echo: EF 30-35%. Glob HK w/o rwma. Mildly reduced RV fxn. Mod to sev dil LA. Nl AoV fxn.  Marland Kitchen Hx of colonoscopy    2009 by Dr. Laural Golden. Normal except for small submucosal lipoma. No evidence of polyps or colitis.  . Hyperlipidemia   . Hypertension   . Hypogonadism in male 07/25/2015  . Ischemic cardiomyopathy    a. 07/2016 Echo: EF 55-60%; b. 02/2018 MV: EF 30%; c. 04/2018 Echo: EF 35%; d. 08/2018 Echo: EF 30-35%.  . OSA (obstructive sleep apnea)    No CPAP  . Permanent atrial fibrillation (HCC)    a. CHA2DS2VASc = 3-->Coumadin (also mech AVR); b. 06/2018 Amio started 2/2 elevated  rates.  . Renal cell cancer ([place])   . Renal cell carcinoma ([place]) 08/04/2016    Medications:  Medications Prior to Admission  Medication Sig Dispense Refill Last Dose  . acetaminophen (TYLENOL) 500 MG tablet Take 1 tablet (500 mg total) by mouth every 4 (four) hours as needed. 30 tablet 0 07/02/2020 at Unknown time  . allopurinol (ZYLOPRIM) 100 MG tablet Take 1 tablet (100 mg total) by mouth daily. 30 tablet 0 07/02/2020 at Unknown time  . CHOLECALCIFEROL PO Take by mouth.   07/02/2020 at Unknown time  . clopidogrel (PLAVIX) 75 MG tablet Take 1 tablet (75 mg total) by mouth daily with breakfast. 90 tablet 3 07/02/2020 at Unknown time  . ezetimibe (ZETIA) 10 MG tablet TAKE 1 TABLET BY MOUTH EVERY DAY (Patient taking  differently: Take 10 mg by mouth daily.) 90 tablet 1 07/02/2020 at Unknown time  . hydroxychloroquine (PLAQUENIL) 200 MG tablet Take 400 mg by mouth at bedtime.   07/02/2020 at Unknown time  . lamoTRIgine (LAMICTAL) 25 MG tablet Take 2 tablets (50 mg total) by mouth 2 (two) times daily. 120 tablet 0 07/02/2020 at Unknown time  . loperamide (IMODIUM A-D) 2 MG tablet Take 2 mg by mouth 4 (four) times daily as needed for diarrhea or loose stools.   Past Week at Unknown time  . megestrol (MEGACE) 40 MG/ML suspension TAKE 10 ML (400 MG TOTAL) BY MOUTH DAILY.   Past Week at Unknown time  . metoprolol tartrate 75 MG TABS Take 75 mg by mouth 2 (two) times daily. 180 tablet 3   . midodrine (PROAMATINE) 10 MG tablet TAKE 1 TABLET BY MOUTH 3 TIMES A WEEK AS DIRECTED TAKE 1 TAB BEFORE DIALYSIS MAY REPEAT ONCE AS NEED   07/02/2020 at Unknown time  . multivitamin (RENA-VIT) TABS tablet Take 1 tablet by mouth daily.   07/02/2020 at Unknown time  . oxyCODONE-acetaminophen (PERCOCET/ROXICET) 5-325 MG tablet Take 1 tablet by mouth every 4 (four) hours as needed.   07/02/2020 at Unknown time  . pantoprazole (PROTONIX) 40 MG tablet Take 1 tablet (40 mg total) by mouth daily. 90 tablet 3 07/02/2020 at Unknown time  . polyethylene glycol (MIRALAX / GLYCOLAX) 17 g packet Take 17 g by mouth daily as needed for mild constipation. 30 each 0 Past Month at Unknown time  . predniSONE (DELTASONE) 5 MG tablet Take 5 mg by mouth daily.   07/02/2020 at Unknown time  . rosuvastatin (CRESTOR) 40 MG tablet Take 1 tablet (40 mg total) by mouth daily at 6 PM. 90 tablet 3 07/02/2020 at Unknown time  . sevelamer carbonate (RENVELA) 800 MG tablet Take 3,200 mg by mouth 3 (three) times daily with meals.   07/02/2020 at Unknown time  . traZODone (DESYREL) 50 MG tablet TAKE 1/2 -1 TABLET (25-50 MG TOTAL) BY MOUTH AT BEDTIME AS NEEDED FOR SLEEP. (Patient taking differently: Take 25-50 mg by mouth at bedtime.) 90 tablet 2 07/02/2020 at Unknown time  .  warfarin (COUMADIN) 1 MG tablet Take 1 to 2 tablets daily as directed by the coumadin clinic (Patient taking differently: Take 1 mg by mouth daily.) 90 tablet 0 07/02/2020 at bedtime  . Epoetin Alfa-epbx (RETACRIT IJ) Epoetin alfa - epbx (Retacrit)     . iron sucrose in sodium chloride 0.9 % 100 mL Iron Sucrose (Venofer)     . lidocaine-prilocaine (EMLA) cream Apply 1 application topically daily as needed (for fistula).     . nitroGLYCERIN (NITROSTAT) 0.4 MG SL tablet Place  1 tablet (0.4 mg total) under the tongue every 5 (five) minutes as needed for chest pain. 25 tablet 3     Assessment: Pharmacy consulted to dose warfarin for Afib in this 65 year old male admitted with septic arthritis.  Pt is s/p I&D of septic knee on 3/28. No major DDI. Unsure reason for INR trending up.   Home regimen: per last communication for warfarin dosing: 1 mg daily.    Date INR Warfarin Dose  3/28 3.4 HOLD  3/29 3.6 HOLD  3/30 4.3 HOLD  3/31 3.8 HOLD    Goal of Therapy:  INR 2-3 Monitor platelets by anticoagulation protocol: Yes   Plan:  INR is supratherapeutic. Will hold warfarin dose tonight. Daily INR ordered. CBC at least every 3 days.    Oswald Hillock, PharmD, BCPS 07/05/2020,1:30 PM

## 2020-07-05 NOTE — Progress Notes (Signed)
   07/04/20 2000  Assess: MEWS Score  Temp 99 F (37.2 C)  BP 113/83  Pulse Rate (!) 126  ECG Heart Rate (!) 127  Resp 19  SpO2 100 %  O2 Device Room Air  Patient Activity (if Appropriate) In bed  Assess: MEWS Score  MEWS Temp 0  MEWS Systolic 0  MEWS Pulse 2  MEWS RR 0  MEWS LOC 0  MEWS Score 2  MEWS Score Color Yellow  Assess: if the MEWS score is Yellow or Red  Were vital signs taken at a resting state? Yes  Focused Assessment No change from prior assessment  Early Detection of Sepsis Score *See Row Information* Low  MEWS guidelines implemented *See Row Information* No, previously yellow, continue vital signs every 4 hours  Treat  MEWS Interventions Administered scheduled meds/treatments  Pain Scale 0-10  Pain Score 3  Pain Type Surgical pain  Pain Location Knee  Pain Orientation Right  Pain Descriptors / Indicators Discomfort  Pain Frequency Occasional  Pain Onset Gradual  Patients Stated Pain Goal 0  Pain Intervention(s) Elevated extremity;Relaxation  Document  Patient Outcome Stabilized after interventions  Progress note created (see row info) Yes

## 2020-07-05 NOTE — Evaluation (Signed)
Physical Therapy Evaluation Patient Details Name: Jeremy [name] MRN: [id number] DOB: 1955-04-24 Today's Date: 07/05/2020   History of Present Illness  Jeremy [name] is a 67yoM who [facility] 1 day after Rt knee redness, swelling, pain, weakness. Pt admitted with sepsis, underwent Right knee I&D on 3/28. 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 PTA pt was using WC to/from POV via WC, power scooter in house, AMB ~69ft distances with HHPT.  Clinical Impression  Pt admitted with above diagnosis. Pt currently with functional limitations due to the deficits listed below (see "PT Problem List"). Upon entry, pt in bed, awake and agreeable to participate. HR in 120s throughout session, but pt reports this is typical of his baseline rates. The pt is alert, pleasant, interactive, and able to provide info regarding prior level of function, both in tolerance and independence. Rt knee remains stiff and uncomfortable, however pt is moving his RLE ad lib without hesitation. Supervision to modI for bed mobility, modA for STS dependent transfer, no RW nor recliner in room this date. Pt orthostatic and dizzy upon sitting, has global weakness with standing <30sec. Pt reports evere weakness compared to baseline. Patient's performance this date reveals decreased ability, independence, and tolerance in performing all basic mobility required for performance of activities of daily living. Pt requires additional DME, close physical assistance, and cues for safe participate in mobility. Pt will benefit from skilled PT intervention to increase independence and safety with basic mobility in preparation for discharge to the venue listed below.       Follow Up Recommendations Home health PT;Supervision for mobility/OOB    Equipment Recommendations  None recommended by PT    Recommendations for Other Services        Precautions / Restrictions Precautions Precautions: Fall Precaution Comments: acute wound to residual limb s/p fall to stump 2 weeks ago Restrictions Weight Bearing Restrictions: No      Mobility  Bed Mobility Overal bed mobility: Needs Assistance Bed Mobility: Supine to Sit;Sit to Supine     Supine to sit: Min guard Sit to supine: Min guard        Transfers Overall transfer level: Needs assistance (dizzy upon sitting x 2 minutes, brief orthostatic drop with 30sec recovery)   Transfers: Sit to/from Stand Sit to Stand: Mod assist         General transfer comment: no RW in room, performed a depedent transfer; toelrates <30sec due to weakness.  Ambulation/Gait                Stairs            Wheelchair Mobility    Modified Rankin (Stroke Patients Only)       Balance Overall balance assessment: Needs assistance   Sitting balance-Leahy Scale: Good                                       Pertinent Vitals/Pain      Home Living Family/patient expects to be discharged to:: Private residence Living Arrangements: Spouse/significant other Available Help at Discharge: Available PRN/intermittently;Other (Comment);Family Type of Home: House Home Access: Stairs to enter;Ramped entrance   Entrance Stairs-Number of Steps: 2 Home Layout: Able to live on main level with bedroom/bathroom;Two level;Laundry or work area in [place]: Environmental consultant - 2 wheels;Walker -  4 wheels;Cane - single point;Electric scooter;Wheelchair - manual Additional Comments: WC cushion;    Prior Function Level of Independence: Needs assistance   Gait / Transfers Assistance Needed: 20-35ft [name] walker with HHPT (advance HC); electric scooter in house, manual WC for BR trips, out of house/care transfers (has no means of tranposrting his scooter)  ADL's / Homemaking Assistance Needed: recently mod I for ADL; OT has DC patient from [place] Hand: Right    Extremity/Trunk Assessment                Communication      Cognition Arousal/Alertness: Awake/alert Behavior During Therapy: WFL for tasks assessed/performed Overall Cognitive Status: Within Functional Limits for tasks assessed                                        General Comments      Exercises     Assessment/Plan    PT Assessment Patient needs continued PT services  PT Problem List Decreased strength;Decreased activity tolerance;Decreased range of motion;Decreased cognition;Decreased balance;Decreased mobility;Decreased coordination       PT Treatment Interventions DME instruction;Balance training;Gait training;Neuromuscular re-education;Stair training;Functional mobility training;Therapeutic activities;Therapeutic exercise;Patient/family education    PT Goals (Current goals can be found in the Care Plan section)  Acute Rehab PT Goals Patient Stated Goal: regain acute strength loss PT Goal Formulation: With patient Time For Goal Achievement: 07/19/20 Potential to Achieve Goals: Good    Frequency 7X/week   Barriers to discharge        Co-evaluation               AM-PAC PT "6 Clicks" Mobility  Outcome Measure Help needed turning from your back to your side while in a flat bed without using bedrails?: None Help needed moving from lying on your back to sitting on the side of a flat bed without using bedrails?: None Help needed moving to and from a bed to a chair (including a wheelchair)?: A Lot Help needed standing up from a chair using your arms (e.g., wheelchair or bedside chair)?: A Lot Help needed to walk in hospital room?: Total Help needed climbing 3-5 steps with a railing? : Total 6 Click Score: 14    End of Session   Activity Tolerance: Patient tolerated treatment well;Patient limited by fatigue Patient left: in bed;with call bell/phone within reach;with family/visitor present   PT  Visit Diagnosis: Difficulty in walking, not elsewhere classified (R26.2);Other abnormalities of gait and mobility (R26.89);Muscle weakness (generalized) (M62.81)    Time: 7342-8768 PT Time Calculation (min) (ACUTE ONLY): 25 min   Charges:   PT Evaluation $PT Eval High Complexity: 1 High          12:49 PM, 07/05/20 Etta Grandchild, PT, DPT Physical Therapist - [facility]  [phone] ([place])    Jeremy [name] 07/05/2020, 12:45 PM

## 2020-07-05 NOTE — Progress Notes (Signed)
Subjective: 3 Days Post-Op Procedure(s) (LRB): KNEE ARTHROSCOPY WITH INCISION AND DRAINAGE (Right) Patient reports pain as mild to moderate.   Patient is well, and has had no acute complaints or problems Plan is to go Home after hospital stay. Negative for chest pain and shortness of breath Fever: no Gastrointestinal:negative for nausea and vomiting  Objective: Vital signs in last 24 hours: Temp:  [98 F (36.7 C)-99.5 F (37.5 C)] 99.5 F (37.5 C) (03/31 0500) Pulse Rate:  [63-132] 126 (03/31 0500) Resp:  [13-22] 17 (03/31 0500) BP: (92-136)/(58-90) 121/71 (03/31 0500) SpO2:  [96 %-100 %] 100 % (03/31 0500) Weight:  [76.1 kg] 76.1 kg (03/31 0500)  Intake/Output from previous day:  Intake/Output Summary (Last 24 hours) at 07/05/2020 0700 Last data filed at 07/04/2020 1345 Gross per 24 hour  Intake 240 ml  Output 2000 ml  Net -1760 ml    Intake/Output this shift: No intake/output data recorded.  Labs: Recent Labs    07/02/20 1429 07/03/20 0632 07/04/20 0544  HGB 9.6* 9.2* 9.3*   Recent Labs    07/03/20 0632 07/04/20 0544  WBC 6.3 7.3  RBC 2.78* 2.89*  HCT 30.2* 30.6*  PLT 95* 104*   Recent Labs    07/04/20 0544 07/04/20 1719 07/05/20 0611  NA 132*  --  136  K 6.3* 5.1 5.5*  CL 91*  --  96*  CO2 26  --  27  BUN 57*  --  41*  CREATININE 7.59*  --  6.07*  GLUCOSE 90  --  104*  CALCIUM 9.7  --  9.4   Recent Labs    07/03/20 0632 07/04/20 0544  INR 3.6* 4.3*     EXAM General - Patient is Alert and Oriented Extremity - limited movement with mild effusion Dressing/Incision - clean, dry, with the hemovac removed with no complications Motor Function - intact, moving knee slightly on exam.   Past Medical History:  Diagnosis Date  . Abnormal stress test 03/19/2018  . 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 Aortic stenosis    a. 2014 s/p mech AVR ([facility])-->chronic coumadin; b. 08/2018 Echo: Mild AS/AI. Nl fxn/gradient.  . Arthritis   . Atrial  fibrillation ([place])   . CAD (coronary artery disease)    a. 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 02/2018 MV [facility]): large inf, infsept, inflat infarct w/ min rev, EF 30%; c. 06/2018 Cath: LM min irregs, LAD 60p, 95/30/60m, LCX min irregs, RCA 40p, 11m, 90d, RPAV 90, RPDA 95ost; d. 09/2018 CABGx3 (LIMA->LAD, VG->D1, VG->PDA).  . Chronic anticoagulation 06/2012   a. Coumadin in the setting of mech AVR and afib.  Marland Kitchen COVID-19 03/15/2019  . CPAP (continuous positive airway pressure) dependence   . Dialysis patient [facility]) 04/07/2006   Patient started HD around 1996,  had 1st transplant LLQ around 1998 until 2007 when they found renal cell cancer in transplant and both native kidneys, all were removed > went back on HD until 2nd transplant RLQ in 2014 which lasted 3 yrs; it was embolized for suspected acute rejection. Is currently on HD MWF in [place] w/ [facility] nephrology.  . ESRD (end stage renal disease) ([place])    a. 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 1999 s/p Kidney transplant; c. 2014 Bilat nephrectomy (transplanted kidney resected) in the setting of renal cell carcinoma; d. PD until 11/2017-->HD.  Marland Kitchen Heart murmur   . HFrEF (  heart failure with reduced ejection fraction) ([place])    a. 07/2016 Echo: EF 55-60%; b. 02/2018 MV: EF 30%; c. 04/2018 Echo: EF 35%; d. 08/2018 Echo: EF 30-35%. Glob HK w/o rwma. Mildly reduced RV fxn. Mod to sev dil LA. Nl AoV fxn.  Marland Kitchen Hx of colonoscopy    2009 by Dr. Laural Golden. Normal except for small submucosal lipoma. No evidence of polyps or colitis.  . Hyperlipidemia   . Hypertension   . Hypogonadism in male 07/25/2015  . Ischemic cardiomyopathy    a. 07/2016 Echo: EF 55-60%; b. 02/2018 MV: EF 30%; c. 04/2018 Echo: EF 35%; d. 08/2018 Echo: EF 30-35%.  . OSA (obstructive sleep apnea)    No CPAP  . Permanent atrial fibrillation (HCC)    a. CHA2DS2VASc = 3-->Coumadin (also mech AVR); b. 06/2018 Amio started 2/2 elevated  rates.  . Renal cell cancer ([place])   . Renal cell carcinoma ([place]) 08/04/2016    Assessment/Plan: 3 Days Post-Op Procedure(s) (LRB): KNEE ARTHROSCOPY WITH INCISION AND DRAINAGE (Right) Principal Problem:   Sepsis ([place]) Active Problems:   ESRD (end stage renal disease) (HCC)   CAD (coronary artery disease)   Hyperlipidemia   Anemia in chronic kidney disease   Atrial fibrillation (HCC)   Aortic valve disorder   Below-knee amputation of left lower extremity (HCC)   Septic arthritis ([place])  Estimated body mass index is 24.07 kg/m as calculated from the following:   Height as of this encounter: 5\' 10"  (1.778 m).   Weight as of this encounter: 76.1 kg. Advance diet Up with therapy Continue ABX therapy due to Post-op infection.  Awaiting Culture results  Appreciate Infectious Disease Consult  DVT Prophylaxis - Coumadin   Reche Dixon, PA-C Orthopaedic Surgery 07/05/2020, 7:00 AM

## 2020-07-05 NOTE — Plan of Care (Signed)
  Problem: Education: Goal: Knowledge of General Education information will improve Description: Including pain rating scale, medication(s)/side effects and non-pharmacologic comfort measures 07/05/2020 1004 by Cristela Blue, RN Outcome: Progressing 07/05/2020 1003 by Cristela Blue, RN Outcome: Progressing   Problem: Clinical Measurements: Goal: Will remain free from infection 07/05/2020 1004 by Cristela Blue, RN Outcome: Progressing 07/05/2020 1003 by Cristela Blue, RN Outcome: Progressing Goal: Diagnostic test results will improve 07/05/2020 1004 by Cristela Blue, RN Outcome: Progressing 07/05/2020 1003 by Cristela Blue, RN Outcome: Progressing   Problem: Activity: Goal: Risk for activity intolerance will decrease 07/05/2020 1004 by Cristela Blue, RN Outcome: Progressing 07/05/2020 1003 by Cristela Blue, RN Outcome: Progressing

## 2020-07-05 NOTE — Evaluation (Signed)
Occupational Therapy Evaluation Patient Details Name: Jeremy [name] MRN: [id number] DOB: [birth year] Today's Date: 07/05/2020    History of Present Illness Sun Wilensky is a 33yoM who comes [facility] on 3/28 1 day after Rt knee redness, swelling, pain, weakness. Pt admitted with sepsis, underwent Right knee I&D on 3/28. 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 PTA pt was using WC to/from POV via WC, power scooter in house, AMB ~72ft distances with HHPT.   Clinical Impression   Pt seen for OT evaluation this date in setting of acute hospitalization d/t sepsis. Pt reports requiring assist for transportation and IADLs from significant other since his initial BKA, but that he was able to perform basic self care without assist and uses power chair in the home for primary functional mobility (manual w/c down ramp to car for appts). Pt presents this date with some decreased strength and decreased INDEP with ADL transfers as well as decreased INDEP with LB self care. Pt currently requires: MIN A for bed mobility, STEUP for seated UB ADLs, MOD A for LB ADLs bed level or seated (lateral lean method). Transfers deferred on OT assessment 2/2 pain. Will continue to follow acutely to increased strength and tolerance and anticipate that pt could benefit from resuming HHOT for fall prevention with self care ADLs in the home environment.     Follow Up Recommendations  Home health OT;Supervision - Intermittent    Equipment Recommendations  3 in 1 bedside commode;Tub/shower seat    Recommendations for Other Services       Precautions / Restrictions Precautions Precautions: Fall Precaution Comments: acute wound to residual limb s/p fall to stump 2 weeks ago      Mobility Bed Mobility Overal bed mobility: Needs Assistance Bed Mobility: Supine to Sit;Sit to Supine     Supine to sit: Min guard;HOB  elevated Sit to supine: Min guard   General bed mobility comments: increased time    Transfers                 General transfer comment: deferred 2/2 pain    Balance Overall balance assessment: Needs assistance   Sitting balance-Leahy Scale: Good                         ADL either performed or assessed with clinical judgement   ADL Overall ADL's : Needs assistance/impaired                           General ADL Comments: MIN A for bed mobility, STEUP for seated UB ADLs, MOD A for LB ADLs bed level or seated (lateral lean method)     Vision Patient Visual Report: No change from baseline       Perception     Praxis      Pertinent Vitals/Pain Pain Assessment: Faces Faces Pain Scale: Hurts little more Pain Location: R residual limb Pain Descriptors / Indicators: Tightness;Sore Pain Intervention(s): Monitored during session;Repositioned (educated pt re: importance of changing position to knee extension as tolerable to encourage healing of connective tissue in a way that will allow for use of prosthesis when approriate.)     Hand Dominance Right   Extremity/Trunk Assessment Upper Extremity Assessment Upper Extremity Assessment: Overall WFL for tasks assessed;Generalized weakness (ROM WFL, MMT grossly 4/5)   Lower Extremity Assessment Lower Extremity  Assessment: Defer to PT evaluation;RLE deficits/detail;LLE deficits/detail RLE Deficits / Details: some swelling and generally decreased tolerance for hip flex/ext. Noted to have limited knee extension (resting in flexion position) RLE: Unable to fully assess due to pain LLE Deficits / Details: [facility]       Communication Communication Communication: No difficulties   Cognition Arousal/Alertness: Awake/alert Behavior During Therapy: WFL for tasks assessed/performed Overall Cognitive Status: Within Functional Limits for tasks assessed                           General Comments        Exercises Other Exercises Other Exercises: OT educates re: role and importance of OOB Activity to prevent further opportunistic infection. Also ed re: positioning to promote healing connective tissue to facilitate later use of prosthesis. Pt with good understanding.   Shoulder Instructions      Home Living Family/patient expects to be discharged to:: Private residence Living Arrangements: Spouse/significant other Available Help at Discharge: Available PRN/intermittently;Other (Comment);Family Type of Home: House Home Access: Stairs to enter;Ramped entrance Entrance Stairs-Number of Steps: 2 Entrance Stairs-Rails: None Home Layout: Able to live on main level with bedroom/bathroom;Two level;Laundry or work area in basement Alternate [employer] of Steps: Full flight of steps to basement   Bathroom Shower/Tub: Occupational psychologist: Standard Bathroom Accessibility: Yes How Accessible: Accessible via walker Home Equipment: [place] - 2 wheels;Walker - 4 wheels;Cane - single point;Electric scooter;Wheelchair - manual   Additional Comments: WC cushion;      Prior Functioning/Environment Level of Independence: Needs assistance  Gait / Transfers Assistance Needed: 20-16ft c walker with HHPT ([facility]); electric scooter in house, manual WC for BR trips, out of house/care transfers (has no means of tranposrting his scooter) ADL's / Homemaking Assistance Needed: recently mod I for ADL; OT has DC patient from [facility] services   Comments: was driving and completely INDEP before sx 2 months ago        OT Problem List: Decreased strength;Decreased activity tolerance;Impaired balance (sitting and/or standing);Decreased knowledge of use of DME or AE;Pain      OT Treatment/Interventions: Self-care/ADL training;DME and/or AE instruction;Therapeutic activities;Balance training;Therapeutic exercise;Energy conservation;Patient/family education    OT Goals(Current goals can be found  in the care plan section) Acute Rehab OT Goals Patient Stated Goal: regain strength and prevent falls OT Goal Formulation: With patient Time For Goal Achievement: 07/19/20 Potential to Achieve Goals: Good ADL Goals Pt Will Perform Lower Body Dressing: with supervision;sitting/lateral leans (with AE PRN with G sitting balance for more dynamic movement) Pt Will Transfer to Toilet: with min guard assist;stand pivot transfer;bedside commode Pt Will Perform Toileting - Clothing Manipulation and hygiene: with supervision;sitting/lateral leans Pt/caregiver will Perform Home Exercise Program: Increased strength;Both right and left upper extremity;With Supervision  OT Frequency: Min 1X/week   Barriers to D/C:            Co-evaluation              AM-PAC OT "6 Clicks" Daily Activity     Outcome Measure Help from another person eating meals?: None Help from another person taking care of personal grooming?: A Little Help from another person toileting, which includes using toliet, bedpan, or urinal?: A Lot Help from another person bathing (including washing, rinsing, drying)?: A Little Help from another person to put on and taking off regular upper body clothing?: A Little Help from another person to put on and taking off regular lower  body clothing?: A Lot 6 Click Score: 17   End of Session    Activity Tolerance: Patient tolerated treatment well Patient left: in bed;with call bell/phone within reach  OT Visit Diagnosis: Unsteadiness on feet (R26.81);Muscle weakness (generalized) (M62.81)                Time: 2233-6122 OT Time Calculation (min): 24 min Charges:  OT General Charges $OT Visit: 1 Visit OT Evaluation $OT Eval Moderate Complexity: 1 Mod OT Treatments $Self Care/Home Management : 8-22 mins  Gerrianne Scale, MS, OTR/L ascom [phone] 07/05/20, 6:12 PM

## 2020-07-05 NOTE — Progress Notes (Signed)
[place], [name] 07/05/20  Subjective:   LOS: 3 Jeremy [name] is a 65 y.o. male with past medical history of ESRD on HD, s/p failed kidney transplant, CAD, A fib, obstructive sleep apnea on CPAP, and PVD with Rt BKA. He presents to the ED with right knee swelling and pain.   He is admitted for further workup of right knee pain  Patient seen resting in bed Alert and oriented Daughter at bedside Tolerating meals without nausea Denies shortness of breath Says he feel better today Denies muscular twitching and spasms today    Objective:  Vital signs in last 24 hours:  Temp:  [98 F (36.7 C)-99.5 F (37.5 C)] 98.4 F (36.9 C) (03/31 1124) Pulse Rate:  [63-132] 125 (03/31 1124) Resp:  [12-22] 12 (03/31 1124) BP: (92-136)/(58-89) 119/80 (03/31 1124) SpO2:  [94 %-100 %] 99 % (03/31 1124) Weight:  [76.1 kg] 76.1 kg (03/31 0500)  Weight change: 0.2 kg Filed Weights   07/03/20 0622 07/04/20 0600 07/05/20 0500  Weight: 73.7 kg 75.9 kg 76.1 kg    Intake/Output:    Intake/Output Summary (Last 24 hours) at 07/05/2020 1308 Last data filed at 07/05/2020 1000 Gross per 24 hour  Intake 240 ml  Output 2000 ml  Net -1760 ml     Physical Exam: General: No acute distress  HEENT Normocephalic, moist oral mucosa  Pulm/lungs Clear lungs  CVS/Heart S1S2 present  Abdomen:  Soft, nontener  Extremities: No peripheral edema, Rt BKA, Rt knee ace bandaged  Neurologic: Alert, oriented  Skin: No masses or rashes  Access: Lt AVF        Basic Metabolic Panel:  Recent Labs  Lab 07/02/20 1429 07/03/20 0632 07/04/20 0544 07/04/20 1719 07/05/20 0611  NA 138 134* 132*  --  136  K 4.1 5.6* 6.3* 5.1 5.5*  CL 94* 94* 91*  --  96*  CO2 30 28 26   --  27  GLUCOSE 95 88 90  --  104*  BUN 24* 36* 57*  --  41*  CREATININE 4.21* 5.56* 7.59*  --  6.07*  CALCIUM 8.7* 9.0 9.7  --  9.4     CBC: Recent Labs  Lab 07/02/20 1429 07/03/20 0632 07/04/20 0544  07/05/20 0611  WBC 7.5 6.3 7.3 5.2  NEUTROABS 5.4  --   --   --   HGB 9.6* 9.2* 9.3* 8.7*  HCT 31.4* 30.2* 30.6* 28.0*  MCV 104.3* 108.6* 105.9* 104.5*  PLT 125* 95* 104* 100*      Lab Results  Component Value Date   HEPBSAG NON REACTIVE 04/13/2020      Microbiology:  Recent Results (from the past 240 hour(s))  Blood culture (single)     Status: None (Preliminary result)   Collection Time: 07/02/20  2:36 PM   Specimen: BLOOD  Result Value Ref Range Status   Specimen Description BLOOD BLOOD RIGHT FOREARM  Final   Special Requests   Final    BOTTLES DRAWN AEROBIC AND ANAEROBIC Blood Culture adequate volume   Culture   Final    NO GROWTH 3 DAYS Performed at [facility], [address] [place], [place] [address]    Report Status PENDING  Incomplete  Blood culture (single)     Status: None (Preliminary result)   Collection Time: 07/02/20  3:37 PM   Specimen: BLOOD  Result Value Ref Range Status   Specimen Description BLOOD RIGHT ANTECUBITAL  Final   Special Requests   Final  BOTTLES DRAWN AEROBIC AND ANAEROBIC Blood Culture adequate volume   Culture   Final    NO GROWTH 3 DAYS Performed at [facility], [address] [place], [place] [address]    Report Status PENDING  Incomplete  Resp Panel by RT-PCR (Flu A&B, Covid) Nasopharyngeal Swab     Status: None   Collection Time: 07/02/20  3:37 PM   Specimen: Nasopharyngeal Swab; Nasopharyngeal(NP) swabs in vial transport medium  Result Value Ref Range Status   SARS Coronavirus 2 by RT PCR NEGATIVE NEGATIVE Final    Comment: (NOTE) SARS-CoV-2 target nucleic acids are NOT DETECTED.  The SARS-CoV-2 RNA is generally detectable in upper respiratory specimens during the acute phase of infection. The lowest concentration of SARS-CoV-2 viral copies this assay can detect is 138 copies/mL. A negative result does not preclude SARS-Cov-2 infection and should not be used as the sole basis for treatment  or other patient management decisions. A negative result may occur with  improper specimen collection/handling, submission of specimen other than nasopharyngeal swab, presence of viral mutation(s) within the areas targeted by this assay, and inadequate number of viral copies(<138 copies/mL). A negative result must be combined with clinical observations, patient history, and epidemiological information. The expected result is Negative.  Fact Sheet for Patients:  EntrepreneurPulse.com.au  Fact Sheet for Healthcare Providers:  IncredibleEmployment.be  This test is no t yet approved or cleared by the [place] FDA and  has been authorized for detection and/or diagnosis of SARS-CoV-2 by FDA under an Emergency Use Authorization (EUA). This EUA will remain  in effect (meaning this test can be used) for the duration of the COVID-19 declaration under Section 564(b)(1) of the Act, 21 U.S.C.section 360bbb-3(b)(1), unless the authorization is terminated  or revoked sooner.       Influenza A by PCR NEGATIVE NEGATIVE Final   Influenza B by PCR NEGATIVE NEGATIVE Final    Comment: (NOTE) The Xpert Xpress SARS-CoV-2/FLU/RSV plus assay is intended as an aid in the diagnosis of influenza from Nasopharyngeal swab specimens and should not be used as a sole basis for treatment. Nasal washings and aspirates are unacceptable for Xpert Xpress SARS-CoV-2/FLU/RSV testing.  Fact Sheet for Patients: EntrepreneurPulse.com.au  Fact Sheet for Healthcare Providers: IncredibleEmployment.be  This test is not yet approved or cleared by the [place] FDA and has been authorized for detection and/or diagnosis of SARS-CoV-2 by FDA under an Emergency Use Authorization (EUA). This EUA will remain in effect (meaning this test can be used) for the duration of the COVID-19 declaration under Section 564(b)(1) of the Act, 21 U.S.C. section  360bbb-3(b)(1), unless the authorization is terminated or revoked.  Performed at [facility], [address] [place], [place] [address]   Aerobic/Anaerobic Culture w Gram Stain (surgical/deep wound)     Status: None (Preliminary result)   Collection Time: 07/02/20  9:50 PM   Specimen: PATH Other; Body Fluid  Result Value Ref Range Status   Specimen Description KNEE RIGHT JOINT  Final   Special Requests ID 1 PATIENT ON FOLLOWING VANC,ROCEPHIN SWAB  Final   Gram Stain   Final    FEW WBC PRESENT, PREDOMINANTLY PMN NO ORGANISMS SEEN    Culture   Final    NO GROWTH 2 DAYS Performed at [facility], [address], [place], [place] [address]    Report Status PENDING  Incomplete  Aerobic/Anaerobic Culture w Gram Stain (surgical/deep wound)     Status: None (Preliminary result)   Collection Time:  07/02/20  9:51 PM   Specimen: PATH Other; Body Fluid  Result Value Ref Range Status   Specimen Description KNEE RIGHT JOINT  Final   Special Requests ID 2 SWAB  Final   Gram Stain MODERATE NO ORGANISMS SEEN   Final   Culture   Final    NO GROWTH 2 DAYS Performed at [facility], [address] [place], [name] [address]    Report Status PENDING  Incomplete    Coagulation Studies: Recent Labs    07/02/20 1537 07/03/20 0632 07/04/20 0544 07/05/20 0920  LABPROT 33.2* 34.9* 40.2* 36.6*  INR 3.4* 3.6* 4.3* 3.8*    Urinalysis: No results for input(s): COLORURINE, LABSPEC, PHURINE, GLUCOSEU, HGBUR, BILIRUBINUR, KETONESUR, PROTEINUR, UROBILINOGEN, NITRITE, LEUKOCYTESUR in the last 72 hours.  Invalid input(s): APPERANCEUR    Imaging: No results found.   Medications:   . cefTRIAXone (ROCEPHIN)  IV 2 g (07/04/20 1613)  . vancomycin 750 mg (07/04/20 1245)   . Chlorhexidine Gluconate Cloth  6 each Topical Q0600  . epoetin (EPOGEN/PROCRIT) injection  10,000 Units Intravenous Q M,W,F-HD  . lamoTRIgine  50 mg Oral BID  . metoprolol tartrate  75 mg Oral BID   . polyethylene glycol  17 g Oral Daily  . senna-docusate  2 tablet Oral BID  . sevelamer carbonate  800 mg Oral TID WC  . traZODone  25-50 mg Oral QHS   acetaminophen **OR** acetaminophen, morphine injection, ondansetron **OR** ondansetron (ZOFRAN) IV, oxyCODONE-acetaminophen  Assessment/ Plan:  65 y.o. male with  was admitted on 07/02/2020 for  Principal Problem:   Sepsis ([place]) Active Problems:   ESRD (end stage renal disease) ([place])   CAD (coronary artery disease)   Hyperlipidemia   Anemia in chronic kidney disease   Atrial fibrillation (HCC)   Aortic valve disorder   Below-knee amputation of left lower extremity (HCC)   Septic arthritis (HCC)  Pyogenic arthritis of right knee joint, due to unspecified organism ([place]) [M00.9] Sepsis without acute organ dysfunction, due to unspecified organism ([name]) [A41.9] Sepsis ([place]) [A41.9]   [facility] [address], MWF/ Lt AVF  #. ESRD on HD -Received dialysis yesterday -UF goal 2L- acheived -next treatment scheduled for Friday - PT eval for strengthening    #. Anemia of Chronic kidney disease  Lab Results  Component Value Date   HGB 8.7 (L) 07/05/2020   EPO prescribed as outpatient EPO with HD   #. Secondary hyperparathyroidism of renal origin N 25.81      Component Value Date/Time   PTH 141 (H) 09/30/2019 2023   PTH Comment 12/14/2015 1059   Lab Results  Component Value Date   PHOS 6.1 (H) 05/03/2020  Sevelamer given with meals TID Monitor phos level during this admission   #. Diabetes type 2 with CKD Hgb A1c MFr Bld (%)  Date Value  09/15/2018 5.9 (H)  Glucose stable  # Hyperkalemia Current level 5.5 Corrected to 5.1 post dialysis yesterday   LOS: [place] 3/31/20221:08 PM  [facility] [place], [place]

## 2020-07-05 NOTE — Progress Notes (Addendum)
Progress Note    Jeremy [name]  IRS:[id number] DOB: November 24, 1955  DOA: 07/02/2020 PCP: Leone Haven, MD      Brief Narrative:    Medical records reviewed and are as summarized below:  Jeremy [name] is a 65 y.o. male with medical history significant for ESRD on hemodialysis on Mondays, Wednesdays and Fridays, s/p previous kidney transplant, CAD, paroxysmal atrial fibrillation, aortic valve disease s/p aortic valve replacement, CAD s/p coronary stent placement, PVD s/p right BKA January 2022, anemia of chronic disease, hyperlipidemia.  He presented to the hospital because of fever, right knee pain and swelling.  He was found to have severe sepsis secondary to right knee septic arthritis.  Right knee synovial fluid was turbid with 65,000 WBCs (98% neutrophils).  He was febrile with temperature 100.8, hypotensive with BP of 85/59, tachycardic with pulse of 127 and tachypneic with respiratory rate of 28.  Lactic acid was 2.6.  He was treated with empiric IV antibiotics.  He was seen in consultation by orthopedic surgeon.  He underwent right knee arthroscopic I&D, right knee partial lateral meniscectomy and right knee synovectomy on 07/02/2020.  ID was consulted to assist with management.  He was also seen by the nephrologist for hemodialysis.      Assessment/Plan:   Principal Problem:   Sepsis ([place]) Active Problems:   ESRD (end stage renal disease) ([place])   CAD (coronary artery disease)   Hyperlipidemia   Anemia in chronic kidney disease   Atrial fibrillation ([facility])   Aortic valve disorder   Below-knee amputation of left lower extremity ([facility])   Septic arthritis ([place])   Body mass index is 24.07 kg/m.    Severe sepsis secondary to right knee septic arthritis: S/p right knee arthroscopic I&D, right knee partial lateral meniscectomy and right knee synovectomy on 07/02/2020.  Continue IV antibiotics.  Follow-up with ID and orthopedic surgeon.  Right knee synovial fluid cultures  pending.  No growth on blood cultures thus far.  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 Monica Martinez, vascular surgeon.  He said that eschar has been present for about 6 months.  He said he had offered patient AKA in the past but patient did not consent for the procedure because he said he was not ready to lose his knee.  He said there was no indication for urgent AKA at this time.  However, he is willing to arrange for AKA if patient is willing to consent for the procedure.  He will see the patient after discharge from the hospital as an outpatient.  ESRD on HD on MWF, hyperkalemia.  S/p previous kidney transplant.  Follow-up with nephrologist.  Paroxysmal atrial fibrillation, s/p aortic valve replacement: Pharmacist has been consulted to manage warfarin.  Thrombocytopenia: Platelet count is stable.  Monitor CBC  Sinus tachycardia: Increase metoprolol from 50 mg twice daily to 75 mg twice daily (home dose)    Diet Order            Diet renal with fluid restriction Fluid restriction: 1200 mL Fluid; Room service appropriate? Yes; Fluid consistency: Thin  Diet effective now                    Consultants:  Orthopedic surgeon  Infectious disease  Procedures:  Right knee arthroscopic I&D, right knee partial lateral meniscectomy and right knee synovectomy on 07/02/2020    Medications:   . Chlorhexidine Gluconate Cloth  6 each Topical Q0600  . epoetin (EPOGEN/PROCRIT) injection  10,000 Units Intravenous Q M,W,F-HD  . lamoTRIgine  50 mg Oral BID  . metoprolol tartrate  75 mg Oral BID  . polyethylene glycol  17 g Oral Daily  . senna-docusate  2 tablet Oral BID  . sevelamer carbonate  800 mg Oral TID WC  . traZODone  25-50 mg Oral QHS   Continuous Infusions: . cefTRIAXone (ROCEPHIN)  IV 2 g (07/04/20 1613)  . vancomycin 750 mg (07/04/20 1245)     Anti-infectives (From admission, onward)   Start     Dose/Rate  Route Frequency Ordered Stop   07/04/20 1200  vancomycin (VANCOREADY) IVPB 750 mg/150 mL        750 mg 150 mL/hr over 60 Minutes Intravenous Every M-W-F (Hemodialysis) 07/02/20 2348     07/03/20 1600  cefTRIAXone (ROCEPHIN) 2 g in sodium chloride 0.9 % 100 mL IVPB        2 g 200 mL/hr over 30 Minutes Intravenous Every 24 hours 07/03/20 0442     07/03/20 0445  vancomycin (VANCOCIN) IVPB 1000 mg/200 mL premix  Status:  Discontinued        1,000 mg 200 mL/hr over 60 Minutes Intravenous  Once 07/03/20 0442 07/03/20 0445   07/02/20 2345  vancomycin (VANCOREADY) IVPB 750 mg/150 mL        750 mg 150 mL/hr over 60 Minutes Intravenous  Once 07/02/20 2336 07/03/20 0051   07/02/20 1530  cefTRIAXone (ROCEPHIN) 2 g in sodium chloride 0.9 % 100 mL IVPB        2 g 200 mL/hr over 30 Minutes Intravenous  Once 07/02/20 1515 07/02/20 1621   07/02/20 1515  vancomycin (VANCOCIN) IVPB 1000 mg/200 mL premix        1,000 mg 200 mL/hr over 60 Minutes Intravenous  Once 07/02/20 1515 07/02/20 1734             Family Communication/Anticipated D/C date and plan/Code Status   DVT prophylaxis: Place and maintain sequential compression device Start: 07/03/20 1532     Code Status: Full Code  Family Communication: Discussed with his daughter at the bedside Disposition Plan:    Status is: Inpatient  Remains inpatient appropriate because:IV treatments appropriate due to intensity of illness or inability to take PO   Dispo: The patient is from: Home              Anticipated d/c is to: Home              Patient currently is not medically stable to d/c.   Difficult to place patient No           Subjective:   C/o pain in the right knee.  His daughter was at the bedside.  Objective:    Vitals:   07/05/20 0400 07/05/20 0500 07/05/20 0753 07/05/20 1124  BP: 117/73 121/71 120/78 119/80  Pulse: (!) 126 (!) 126 (!) 127 (!) 125  Resp: 14 17 13 12   Temp: 98.9 F (37.2 C) 99.5 F (37.5 C)  98.6 F (37 C) 98.4 F (36.9 C)  TempSrc: Oral Oral Oral Oral  SpO2: 98% 100% 94% 99%  Weight:  76.1 kg    Height:       No data found.   Intake/Output Summary (Last 24 hours) at 07/05/2020 1514 Last data filed at 07/05/2020 1345 Gross per 24 hour  Intake 480 ml  Output --  Net 480 ml   [employer]   07/03/20  0622 07/04/20 0600 07/05/20 0500  Weight: 73.7 kg 75.9 kg 76.1 kg    Exam:  GEN: NAD SKIN: No rash EYES: EOMI ENT: MMM CV: RRR PULM: CTA B ABD: soft, ND, NT, +BS CNS: AAO x 3, non focal EXT: Right BKA with chronic eschar on the stump.  Incisional wound on right knee is clean and dry.     Data Reviewed:   I have personally reviewed following labs and imaging studies:  Labs: Labs show the following:   Basic Metabolic Panel: Recent Labs  Lab 07/02/20 1429 07/03/20 0632 07/04/20 0544 07/04/20 1719 07/05/20 0611  NA 138 134* 132*  --  136  K 4.1 5.6* 6.3* 5.1 5.5*  CL 94* 94* 91*  --  96*  CO2 30 28 26   --  27  GLUCOSE 95 88 90  --  104*  BUN 24* 36* 57*  --  41*  CREATININE 4.21* 5.56* 7.59*  --  6.07*  CALCIUM 8.7* 9.0 9.7  --  9.4   GFR Estimated Creatinine Clearance: 12.9 mL/min (A) (by C-G formula based on SCr of 6.07 mg/dL (H)). Liver Function Tests: Recent Labs  Lab 07/02/20 1429 07/03/20 0632  AST 25 20  ALT 18 14  ALKPHOS 127* 97  BILITOT 1.0 0.8  PROT 7.6 6.5  ALBUMIN 3.5 2.9*   No results for input(s): LIPASE, AMYLASE in the last 168 hours. No results for input(s): AMMONIA in the last 168 hours. Coagulation profile Recent Labs  Lab 07/02/20 1537 07/03/20 0632 07/04/20 0544 07/05/20 0920  INR 3.4* 3.6* 4.3* 3.8*    CBC: Recent Labs  Lab 07/02/20 1429 07/03/20 0632 07/04/20 0544 07/05/20 0611  WBC 7.5 6.3 7.3 5.2  NEUTROABS 5.4  --   --   --   HGB 9.6* 9.2* 9.3* 8.7*  HCT 31.4* 30.2* 30.6* 28.0*  MCV 104.3* 108.6* 105.9* 104.5*  PLT 125* 95* 104* 100*   Cardiac Enzymes: No results for input(s): CKTOTAL,  CKMB, CKMBINDEX, TROPONINI in the last 168 hours. BNP (last 3 results) No results for input(s): PROBNP in the last 8760 hours. CBG: No results for input(s): GLUCAP in the last 168 hours. D-Dimer: No results for input(s): DDIMER in the last 72 hours. Hgb A1c: No results for input(s): HGBA1C in the last 72 hours. Lipid Profile: No results for input(s): CHOL, HDL, LDLCALC, TRIG, CHOLHDL, LDLDIRECT in the last 72 hours. Thyroid function studies: No results for input(s): TSH, T4TOTAL, T3FREE, THYROIDAB in the last 72 hours.  Invalid input(s): FREET3 Anemia work up: Recent Labs    07/04/20 0544  VITAMINB12 251  FOLATE 36.0   Sepsis Labs: Recent Labs  Lab 07/02/20 1429 07/02/20 1537 07/02/20 1750 07/03/20 0632 07/04/20 0544 07/05/20 0611  PROCALCITON  --   --   --  2.39  --   --   WBC 7.5  --   --  6.3 7.3 5.2  LATICACIDVEN 2.0* 2.6* 1.3  --   --   --     Microbiology Recent Results (from the past 240 hour(s))  Blood culture (single)     Status: None (Preliminary result)   Collection Time: 07/02/20  2:36 PM   Specimen: BLOOD  Result Value Ref Range Status   Specimen Description BLOOD BLOOD RIGHT FOREARM  Final   Special Requests   Final    BOTTLES DRAWN AEROBIC AND ANAEROBIC Blood Culture adequate volume   Culture   Final    NO GROWTH 3 DAYS Performed at [facility],  [place], [place] [address]    Report Status PENDING  Incomplete  Blood culture (single)     Status: None (Preliminary result)   Collection Time: 07/02/20  3:37 PM   Specimen: BLOOD  Result Value Ref Range Status   Specimen Description BLOOD RIGHT ANTECUBITAL  Final   Special Requests   Final    BOTTLES DRAWN AEROBIC AND ANAEROBIC Blood Culture adequate volume   Culture   Final    NO GROWTH 3 DAYS Performed at [facility], [address], [place], [place] [address]    Report Status PENDING  Incomplete  Resp Panel by RT-PCR (Flu A&B, Covid) Nasopharyngeal Swab      Status: None   Collection Time: 07/02/20  3:37 PM   Specimen: Nasopharyngeal Swab; Nasopharyngeal(NP) swabs in vial transport medium  Result Value Ref Range Status   SARS Coronavirus 2 by RT PCR NEGATIVE NEGATIVE Final    Comment: (NOTE) SARS-CoV-2 target nucleic acids are NOT DETECTED.  The SARS-CoV-2 RNA is generally detectable in upper respiratory specimens during the acute phase of infection. The lowest concentration of SARS-CoV-2 viral copies this assay can detect is 138 copies/mL. A negative result does not preclude SARS-Cov-2 infection and should not be used as the sole basis for treatment or other patient management decisions. A negative result may occur with  improper specimen collection/handling, submission of specimen other than nasopharyngeal swab, presence of viral mutation(s) within the areas targeted by this assay, and inadequate number of viral copies(<138 copies/mL). A negative result must be combined with clinical observations, patient history, and epidemiological information. The expected result is Negative.  Fact Sheet for Patients:  EntrepreneurPulse.com.au  Fact Sheet for Healthcare Providers:  IncredibleEmployment.be  This test is no t yet approved or cleared by the [place] FDA and  has been authorized for detection and/or diagnosis of SARS-CoV-2 by FDA under an Emergency Use Authorization (EUA). This EUA will remain  in effect (meaning this test can be used) for the duration of the COVID-19 declaration under Section 564(b)(1) of the Act, 21 U.S.C.section 360bbb-3(b)(1), unless the authorization is terminated  or revoked sooner.       Influenza A by PCR NEGATIVE NEGATIVE Final   Influenza B by PCR NEGATIVE NEGATIVE Final    Comment: (NOTE) The Xpert Xpress SARS-CoV-2/FLU/RSV plus assay is intended as an aid in the diagnosis of influenza from Nasopharyngeal swab specimens and should not be used as a sole basis  for treatment. Nasal washings and aspirates are unacceptable for Xpert Xpress SARS-CoV-2/FLU/RSV testing.  Fact Sheet for Patients: EntrepreneurPulse.com.au  Fact Sheet for Healthcare Providers: IncredibleEmployment.be  This test is not yet approved or cleared by the [place] FDA and has been authorized for detection and/or diagnosis of SARS-CoV-2 by FDA under an Emergency Use Authorization (EUA). This EUA will remain in effect (meaning this test can be used) for the duration of the COVID-19 declaration under Section 564(b)(1) of the Act, 21 U.S.C. section 360bbb-3(b)(1), unless the authorization is terminated or revoked.  Performed at [facility], [address] [place], [place] [address]   Aerobic/Anaerobic Culture w Gram Stain (surgical/deep wound)     Status: None (Preliminary result)   Collection Time: 07/02/20  9:50 PM   Specimen: PATH Other; Body Fluid  Result Value Ref Range Status   Specimen Description KNEE RIGHT JOINT  Final   Special Requests ID 1 PATIENT ON FOLLOWING VANC,ROCEPHIN SWAB  Final   Gram Stain   Final  FEW WBC PRESENT, PREDOMINANTLY PMN NO ORGANISMS SEEN    Culture   Final    NO GROWTH 2 DAYS Performed at [facility] [address], [place], [place] [address]    Report Status PENDING  Incomplete  Aerobic/Anaerobic Culture w Gram Stain (surgical/deep wound)     Status: None (Preliminary result)   Collection Time: 07/02/20  9:51 PM   Specimen: PATH Other; Body Fluid  Result Value Ref Range Status   Specimen Description KNEE RIGHT JOINT  Final   Special Requests ID 2 SWAB  Final   Gram Stain MODERATE NO ORGANISMS SEEN   Final   Culture   Final    NO GROWTH 2 DAYS Performed at [facility], [address] [place], [place] [address]    Report Status PENDING  Incomplete    Procedures and diagnostic studies:  No results found.             LOS: 3 days   Joycelin Radloff  [facility]   Pager on [email or web address]. If 7PM-7AM, please contact night-coverage at [email or web address]     07/05/2020, 3:14 PM

## 2020-07-06 ENCOUNTER — Other Ambulatory Visit: Payer: Self-pay | Admitting: Cardiovascular Disease

## 2020-07-06 DIAGNOSIS — Z992 Dependence on renal dialysis: Secondary | ICD-10-CM | POA: Diagnosis not present

## 2020-07-06 DIAGNOSIS — N186 End stage renal disease: Secondary | ICD-10-CM | POA: Diagnosis not present

## 2020-07-06 DIAGNOSIS — T8612 Kidney transplant failure: Secondary | ICD-10-CM

## 2020-07-06 DIAGNOSIS — I2510 Atherosclerotic heart disease of native coronary artery without angina pectoris: Secondary | ICD-10-CM | POA: Diagnosis not present

## 2020-07-06 DIAGNOSIS — M009 Pyogenic arthritis, unspecified: Secondary | ICD-10-CM | POA: Diagnosis not present

## 2020-07-06 LAB — RENAL FUNCTION PANEL
Albumin: 2.8 g/dL — ABNORMAL LOW (ref 3.5–5.0)
Anion gap: 14 (ref 5–15)
BUN: 57 mg/dL — ABNORMAL HIGH (ref 8–23)
CO2: 25 mmol/L (ref 22–32)
Calcium: 9.5 mg/dL (ref 8.9–10.3)
Chloride: 94 mmol/L — ABNORMAL LOW (ref 98–111)
Creatinine, Ser: 7.76 mg/dL — ABNORMAL HIGH (ref 0.61–1.24)
GFR, Estimated: 7 mL/min — ABNORMAL LOW (ref >60)
Glucose, Bld: 86 mg/dL (ref 70–99)
Phosphorus: 7.9 mg/dL — ABNORMAL HIGH (ref 2.5–4.6)
Potassium: 5.7 mmol/L — ABNORMAL HIGH (ref 3.5–5.1)
Sodium: 133 mmol/L — ABNORMAL LOW (ref 135–145)

## 2020-07-06 LAB — CBC WITH DIFFERENTIAL/PLATELET
Abs Immature Granulocytes: 0.01 10*3/uL (ref 0.00–0.07)
Basophils Absolute: 0 10*3/uL (ref 0.0–0.1)
Basophils Relative: 1 %
Eosinophils Absolute: 0 10*3/uL (ref 0.0–0.5)
Eosinophils Relative: 0 %
HCT: 26.4 % — ABNORMAL LOW (ref 39.0–52.0)
Hemoglobin: 8.2 g/dL — ABNORMAL LOW (ref 13.0–17.0)
Immature Granulocytes: 0 %
Lymphocytes Relative: 32 %
Lymphs Abs: 1.4 10*3/uL (ref 0.7–4.0)
MCH: 32 pg (ref 26.0–34.0)
MCHC: 31.1 g/dL (ref 30.0–36.0)
MCV: 103.1 fL — ABNORMAL HIGH (ref 80.0–100.0)
Monocytes Absolute: 0.4 10*3/uL (ref 0.1–1.0)
Monocytes Relative: 8 %
Neutro Abs: 2.6 10*3/uL (ref 1.7–7.7)
Neutrophils Relative %: 59 %
Platelets: 102 10*3/uL — ABNORMAL LOW (ref 150–400)
RBC: 2.56 MIL/uL — ABNORMAL LOW (ref 4.22–5.81)
RDW: 17.4 % — ABNORMAL HIGH (ref 11.5–15.5)
WBC: 4.4 10*3/uL (ref 4.0–10.5)
nRBC: 0 % (ref 0.0–0.2)

## 2020-07-06 LAB — HEPATITIS B DNA, ULTRAQUANTITATIVE, PCR
HBV DNA SERPL PCR-ACNC: NOT DETECTED IU/mL
HBV DNA SERPL PCR-LOG IU: UNDETERMINED log10 IU/mL

## 2020-07-06 LAB — HEPATITIS B SURFACE ANTIGEN: Hepatitis B Surface Ag: NONREACTIVE

## 2020-07-06 LAB — PROTIME-INR
INR: 4 — ABNORMAL HIGH (ref 0.8–1.2)
Prothrombin Time: 37.7 seconds — ABNORMAL HIGH (ref 11.4–15.2)

## 2020-07-06 MED ORDER — METOPROLOL TARTRATE 50 MG PO TABS
100.0000 mg | ORAL_TABLET | Freq: Two times a day (BID) | ORAL | Status: DC
  Administered 2020-07-06 – 2020-07-08 (×4): 100 mg via ORAL
  Filled 2020-07-06 (×4): qty 2

## 2020-07-06 NOTE — Telephone Encounter (Signed)
Pt in the hospital at his time & refill last sent 06/11/20 for 90 day supply

## 2020-07-06 NOTE — Progress Notes (Addendum)
Progress Note    Jeremy [name]  KGM:[id number] DOB: December 06, 1955  DOA: 07/02/2020 PCP: Leone Haven, MD      Brief Narrative:    Medical records reviewed and are as summarized below:  Jeremy [name] is a 65 y.o. male with medical history significant for ESRD on hemodialysis on Mondays, Wednesdays and Fridays, s/p previous kidney transplant, CAD, paroxysmal atrial fibrillation, aortic valve disease s/p aortic valve replacement, CAD s/p coronary stent placement, PVD s/p right BKA January 2022, anemia of chronic disease, hyperlipidemia.  He presented to the hospital because of fever, right knee pain and swelling.  He was found to have severe sepsis secondary to right knee septic arthritis.  Right knee synovial fluid was turbid with 65,000 WBCs (98% neutrophils).  He was febrile with temperature 100.8, hypotensive with BP of 85/59, tachycardic with pulse of 127 and tachypneic with respiratory rate of 28.  Lactic acid was 2.6.  He was treated with empiric IV antibiotics.  He was seen in consultation by orthopedic surgeon.  He underwent right knee arthroscopic I&D, right knee partial lateral meniscectomy and right knee synovectomy on 07/02/2020.  ID was consulted to assist with management.  He was also seen by the nephrologist for hemodialysis.      Assessment/Plan:   Principal Problem:   Sepsis ([place]) Active Problems:   ESRD (end stage renal disease) ([place])   CAD (coronary artery disease)   Hyperlipidemia   Anemia in chronic kidney disease   Atrial fibrillation (H[facility])   Aortic valve disorder   Below-knee amputation of left lower extremity (HCC)   Septic arthritis ([place])   Body mass index is 24.07 kg/m.    Severe sepsis secondary to right knee septic arthritis: S/p right knee arthroscopic I&D, right knee partial lateral meniscectomy and right knee synovectomy on 07/02/2020.  Continue IV antibiotics.  Follow-up with ID and orthopedic surgeon.  Right knee synovial fluid cultures  pending.  No growth on blood cultures thus far.  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 Monica Martinez on 07/05/2020.  He recommended outpatient follow-up to discuss right AKA if desired.  ESRD on HD on MWF, hyperkalemia.  S/p previous kidney transplant.  Follow-up with nephrologist for hemodialysis.  Paroxysmal atrial fibrillation, s/p aortic valve replacement: INR is supratherapeutic at 4.0.  Follow-up with pharmacist for Coumadin management.  Thrombocytopenia: Platelet count is stable.  Monitor CBC  Chronic sinus tachycardia: Increase metoprolol from 75 mg twice daily 100 mg twice daily (home dose).     Diet Order            Diet renal with fluid restriction Fluid restriction: 1200 mL Fluid; Room service appropriate? Yes; Fluid consistency: Thin  Diet effective now                    Consultants:  Orthopedic surgeon  Infectious disease  Procedures:  Right knee arthroscopic I&D, right knee partial lateral meniscectomy and right knee synovectomy on 07/02/2020    Medications:   . Chlorhexidine Gluconate Cloth  6 each Topical Q0600  . epoetin (EPOGEN/PROCRIT) injection  10,000 Units Intravenous Q M,W,F-HD  . lamoTRIgine  50 mg Oral BID  . metoprolol tartrate  100 mg Oral BID  . polyethylene glycol  17 g Oral Daily  . senna-docusate  2 tablet Oral BID  . sevelamer carbonate  800 mg Oral TID WC  . traZODone  25-50 mg Oral QHS  Continuous Infusions: . cefTRIAXone (ROCEPHIN)  IV 2 g (07/05/20 1732)  . vancomycin 750 mg (07/04/20 1245)     Anti-infectives (From admission, onward)   Start     Dose/Rate Route Frequency Ordered Stop   07/04/20 1200  vancomycin (VANCOREADY) IVPB 750 mg/150 mL        750 mg 150 mL/hr over 60 Minutes Intravenous Every M-W-F (Hemodialysis) 07/02/20 2348     07/03/20 1600  cefTRIAXone (ROCEPHIN) 2 g in sodium chloride 0.9 % 100 mL IVPB        2 g 200 mL/hr over 30 Minutes  Intravenous Every 24 hours 07/03/20 0442     07/03/20 0445  vancomycin (VANCOCIN) IVPB 1000 mg/200 mL premix  Status:  Discontinued        1,000 mg 200 mL/hr over 60 Minutes Intravenous  Once 07/03/20 0442 07/03/20 0445   07/02/20 2345  vancomycin (VANCOREADY) IVPB 750 mg/150 mL        750 mg 150 mL/hr over 60 Minutes Intravenous  Once 07/02/20 2336 07/03/20 0051   07/02/20 1530  cefTRIAXone (ROCEPHIN) 2 g in sodium chloride 0.9 % 100 mL IVPB        2 g 200 mL/hr over 30 Minutes Intravenous  Once 07/02/20 1515 07/02/20 1621   07/02/20 1515  vancomycin (VANCOCIN) IVPB 1000 mg/200 mL premix        1,000 mg 200 mL/hr over 60 Minutes Intravenous  Once 07/02/20 1515 07/02/20 1734             Family Communication/Anticipated D/C date and plan/Code Status   DVT prophylaxis: Place and maintain sequential compression device Start: 07/03/20 1532     Code Status: Full Code  Family Communication: Discussed with his daughter at the bedside Disposition Plan:    Status is: Inpatient  Remains inpatient appropriate because:IV treatments appropriate due to intensity of illness or inability to take PO   Dispo: The patient is from: Home              Anticipated d/c is to: Home              Patient currently is not medically stable to d/c.   Difficult to place patient No           Subjective:   Interval events noted.  No new complaints.  Patient mentioned that he has been dealing with chronic tachycardia for some time now.  He said that he was previously taking metoprolol 75 mg twice daily to control his heart rate but this was increased 200 mg twice daily at the behest of Dr. [name] about 3 weeks ago.   Objective:    Vitals:   07/06/20 0600 07/06/20 0700 07/06/20 0742 07/06/20 0800  BP: 103/73 (!) 115/97  113/72  Pulse: (!) 118 (!) 116  (!) 120  Resp: 16 (!) 23  (!) 22  Temp:   98.3 F (36.8 C) 98.3 F (36.8 C)  TempSrc:   Oral   SpO2: 100% 100%  100%  Weight:       Height:       No data found.   Intake/Output Summary (Last 24 hours) at 07/06/2020 0946 Last data filed at 07/05/2020 1345 Gross per 24 hour  Intake 480 ml  Output --  Net 480 ml   Filed Weights   07/03/20 0622 07/04/20 0600 07/05/20 0500  Weight: 73.7 kg 75.9 kg 76.1 kg    Exam:  GEN: NAD SKIN: Warm and dry EYES: No pallor or  icterus ENT: MMM CV: RRR PULM: CTA B ABD: soft, ND, NT, +BS CNS: AAO x 3, non focal EXT: Right BKA with chronic eschar on the stump.  Incision on her right knee is clean and dry.     Data Reviewed:   I have personally reviewed following labs and imaging studies:  Labs: Labs show the following:   Basic Metabolic Panel: Recent Labs  Lab 07/02/20 1429 07/03/20 0632 07/04/20 0544 07/04/20 1719 07/05/20 0611 07/06/20 0631  NA 138 134* 132*  --  136 133*  K 4.1 5.6* 6.3*   < > 5.5* 5.7*  CL 94* 94* 91*  --  96* 94*  CO2 30 28 26   --  27 25  GLUCOSE 95 88 90  --  104* 86  BUN 24* 36* 57*  --  41* 57*  CREATININE 4.21* 5.56* 7.59*  --  6.07* 7.76*  CALCIUM 8.7* 9.0 9.7  --  9.4 9.5  PHOS  --   --   --   --   --  7.9*   < > = values in this interval not displayed.   GFR Estimated Creatinine Clearance: 10.1 mL/min (A) (by C-G formula based on SCr of 7.76 mg/dL (H)). Liver Function Tests: Recent Labs  Lab 07/02/20 1429 07/03/20 0632 07/06/20 0631  AST 25 20  --   ALT 18 14  --   ALKPHOS 127* 97  --   BILITOT 1.0 0.8  --   PROT 7.6 6.5  --   ALBUMIN 3.5 2.9* 2.8*   No results for input(s): LIPASE, AMYLASE in the last 168 hours. No results for input(s): AMMONIA in the last 168 hours. Coagulation profile Recent Labs  Lab 07/02/20 1537 07/03/20 0632 07/04/20 0544 07/05/20 0920 07/06/20 0631  INR 3.4* 3.6* 4.3* 3.8* 4.0*    CBC: Recent Labs  Lab 07/02/20 1429 07/03/20 0632 07/04/20 0544 07/05/20 0611 07/06/20 0631  WBC 7.5 6.3 7.3 5.2 4.4  NEUTROABS 5.4  --   --   --  2.6  HGB 9.6* 9.2* 9.3* 8.7* 8.2*  HCT 31.4*  30.2* 30.6* 28.0* 26.4*  MCV 104.3* 108.6* 105.9* 104.5* 103.1*  PLT 125* 95* 104* 100* 102*   Cardiac Enzymes: No results for input(s): CKTOTAL, CKMB, CKMBINDEX, TROPONINI in the last 168 hours. BNP (last 3 results) No results for input(s): PROBNP in the last 8760 hours. CBG: No results for input(s): GLUCAP in the last 168 hours. D-Dimer: No results for input(s): DDIMER in the last 72 hours. Hgb A1c: No results for input(s): HGBA1C in the last 72 hours. Lipid Profile: No results for input(s): CHOL, HDL, LDLCALC, TRIG, CHOLHDL, LDLDIRECT in the last 72 hours. Thyroid function studies: No results for input(s): TSH, T4TOTAL, T3FREE, THYROIDAB in the last 72 hours.  Invalid input(s): FREET3 Anemia work up: Recent Labs    07/04/20 0544  VITAMINB12 251  FOLATE 36.0   Sepsis Labs: Recent Labs  Lab 07/02/20 1429 07/02/20 1537 07/02/20 1750 07/03/20 9518 07/04/20 0544 07/05/20 0611 07/06/20 0631  PROCALCITON  --   --   --  2.39  --   --   --   WBC 7.5  --   --  6.3 7.3 5.2 4.4  LATICACIDVEN 2.0* 2.6* 1.3  --   --   --   --     Microbiology Recent Results (from the past 240 hour(s))  Blood culture (single)     Status: None (Preliminary result)   Collection Time: 07/02/20  2:36 PM  Specimen: BLOOD  Result Value Ref Range Status   Specimen Description BLOOD BLOOD RIGHT FOREARM  Final   Special Requests   Final    BOTTLES DRAWN AEROBIC AND ANAEROBIC Blood Culture adequate volume   Culture   Final    NO GROWTH 4 DAYS Performed at [facility], [address], [place], [place] [address]    Report Status PENDING  Incomplete  Blood culture (single)     Status: None (Preliminary result)   Collection Time: 07/02/20  3:37 PM   Specimen: BLOOD  Result Value Ref Range Status   Specimen Description BLOOD RIGHT ANTECUBITAL  Final   Special Requests   Final    BOTTLES DRAWN AEROBIC AND ANAEROBIC Blood Culture adequate volume   Culture   Final    NO GROWTH 4  DAYS Performed at [facility], [address] [place], [place] [address]    Report Status PENDING  Incomplete  Resp Panel by RT-PCR (Flu A&B, Covid) Nasopharyngeal Swab     Status: None   Collection Time: 07/02/20  3:37 PM   Specimen: Nasopharyngeal Swab; Nasopharyngeal(NP) swabs in vial transport medium  Result Value Ref Range Status   SARS Coronavirus 2 by RT PCR NEGATIVE NEGATIVE Final    Comment: (NOTE) SARS-CoV-2 target nucleic acids are NOT DETECTED.  The SARS-CoV-2 RNA is generally detectable in upper respiratory specimens during the acute phase of infection. The lowest concentration of SARS-CoV-2 viral copies this assay can detect is 138 copies/mL. A negative result does not preclude SARS-Cov-2 infection and should not be used as the sole basis for treatment or other patient management decisions. A negative result may occur with  improper specimen collection/handling, submission of specimen other than nasopharyngeal swab, presence of viral mutation(s) within the areas targeted by this assay, and inadequate number of viral copies(<138 copies/mL). A negative result must be combined with clinical observations, patient history, and epidemiological information. The expected result is Negative.  Fact Sheet for Patients:  EntrepreneurPulse.com.au  Fact Sheet for Healthcare Providers:  IncredibleEmployment.be  This test is no t yet approved or cleared by the [place] FDA and  has been authorized for detection and/or diagnosis of SARS-CoV-2 by FDA under an Emergency Use Authorization (EUA). This EUA will remain  in effect (meaning this test can be used) for the duration of the COVID-19 declaration under Section 564(b)(1) of the Act, 21 U.S.C.section 360bbb-3(b)(1), unless the authorization is terminated  or revoked sooner.       Influenza A by PCR NEGATIVE NEGATIVE Final   Influenza B by PCR NEGATIVE NEGATIVE Final     Comment: (NOTE) The Xpert Xpress SARS-CoV-2/FLU/RSV plus assay is intended as an aid in the diagnosis of influenza from Nasopharyngeal swab specimens and should not be used as a sole basis for treatment. Nasal washings and aspirates are unacceptable for Xpert Xpress SARS-CoV-2/FLU/RSV testing.  Fact Sheet for Patients: EntrepreneurPulse.com.au  Fact Sheet for Healthcare Providers: IncredibleEmployment.be  This test is not yet approved or cleared by the [place] FDA and has been authorized for detection and/or diagnosis of SARS-CoV-2 by FDA under an Emergency Use Authorization (EUA). This EUA will remain in effect (meaning this test can be used) for the duration of the COVID-19 declaration under Section 564(b)(1) of the Act, 21 U.S.C. section 360bbb-3(b)(1), unless the authorization is terminated or revoked.  Performed at [facility], [address] [place], [place] [address]   Aerobic/Anaerobic Culture w Gram Stain (surgical/deep wound)     Status:  None (Preliminary result)   Collection Time: 07/02/20  9:50 PM   Specimen: PATH Other; Body Fluid  Result Value Ref Range Status   Specimen Description KNEE RIGHT JOINT  Final   Special Requests ID 1 PATIENT ON FOLLOWING VANC,ROCEPHIN SWAB  Final   Gram Stain   Final    FEW WBC PRESENT, PREDOMINANTLY PMN NO ORGANISMS SEEN    Culture   Final    NO GROWTH 2 DAYS Performed at [facility], [address], [place], [place] [address]    Report Status PENDING  Incomplete  Aerobic/Anaerobic Culture w Gram Stain (surgical/deep wound)     Status: None (Preliminary result)   Collection Time: 07/02/20  9:51 PM   Specimen: PATH Other; Body Fluid  Result Value Ref Range Status   Specimen Description KNEE RIGHT JOINT  Final   Special Requests ID 2 SWAB  Final   Gram Stain MODERATE NO ORGANISMS SEEN   Final   Culture   Final    NO GROWTH 2 DAYS Performed at [facility]  Lab, [address] [place], [place] [address]    Report Status PENDING  Incomplete    Procedures and diagnostic studies:  No results found.             LOS: 4 days   Stephanie Littman  [facility]   Pager on [email or web address]. If 7PM-7AM, please contact night-coverage at [email or web address]     07/06/2020, 9:45 AM

## 2020-07-06 NOTE — Progress Notes (Signed)
Subjective: 4 Days Post-Op Procedure(s) (LRB): KNEE ARTHROSCOPY WITH INCISION AND DRAINAGE (Right) Patient reports pain as mild to moderate.   Patient is well, and has had no acute complaints or problems Plan is to go Home after hospital stay. Negative for chest pain and shortness of breath Fever: no Gastrointestinal:negative for nausea and vomiting  Objective: Vital signs in last 24 hours: Temp:  [98.4 F (36.9 C)-99.1 F (37.3 C)] 99.1 F (37.3 C) (04/01 0400) Pulse Rate:  [120-127] 120 (04/01 0400) Resp:  [12-20] 15 (04/01 0400) BP: (92-142)/(62-82) 92/62 (04/01 0400) SpO2:  [94 %-99 %] 96 % (04/01 0400)  Intake/Output from previous day:  Intake/Output Summary (Last 24 hours) at 07/06/2020 0700 Last data filed at 07/05/2020 1345 Gross per 24 hour  Intake 480 ml  Output --  Net 480 ml    Intake/Output this shift: No intake/output data recorded.  Labs: Recent Labs    07/04/20 0544 07/05/20 0611 07/06/20 0631  HGB 9.3* 8.7* 8.2*   Recent Labs    07/05/20 0611 07/06/20 0631  WBC 5.2 4.4  RBC 2.68* 2.56*  HCT 28.0* 26.4*  PLT 100* 102*   Recent Labs    07/04/20 0544 07/04/20 1719 07/05/20 0611  NA 132*  --  136  K 6.3* 5.1 5.5*  CL 91*  --  96*  CO2 26  --  27  BUN 57*  --  41*  CREATININE 7.59*  --  6.07*  GLUCOSE 90  --  104*  CALCIUM 9.7  --  9.4   Recent Labs    07/04/20 0544 07/05/20 0920  INR 4.3* 3.8*     EXAM General - Patient is Alert and Oriented Extremity - limited movement with mild effusion Dressing/Incision - clean, dry and intact Motor Function - intact, moving knee slightly on exam.   Past Medical History:  Diagnosis Date  . Abnormal stress test 03/19/2018  . 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 Aortic stenosis    a. 2014 s/p mech AVR ([facility])-->chronic coumadin; b. 08/2018 Echo: Mild AS/AI. Nl fxn/gradient.  . Arthritis   . Atrial fibrillation ([place])   . CAD (coronary artery disease)    a. 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 02/2018 MV [place]): large inf, infsept, inflat infarct w/ min rev, EF 30%; c. 06/2018 Cath: LM min irregs, LAD 60p, 95/30/59m, LCX min irregs, RCA 40p, 35m, 90d, RPAV 90, RPDA 95ost; d. 09/2018 CABGx3 (LIMA->LAD, VG->D1, VG->PDA).  . Chronic anticoagulation 06/2012   a. Coumadin in the setting of mech AVR and afib.  Marland Kitchen COVID-19 03/15/2019  . CPAP (continuous positive airway pressure) dependence   . Dialysis patient [facility]) 04/07/2006   Patient started HD around 1996,  had 1st transplant LLQ around 1998 until 2007 when they found renal cell cancer in transplant and both native kidneys, all were removed > went back on HD until 2nd transplant RLQ in 2014 which lasted 3 yrs; it was embolized for suspected acute rejection. Is currently on HD MWF in [place] w/ [facility] nephrology.  . ESRD (end stage renal disease) ([place])    a. 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 1999 s/p Kidney transplant; c. 2014 Bilat nephrectomy (transplanted kidney resected) in the setting of renal cell carcinoma; d. PD until 11/2017-->HD.  Marland Kitchen Heart murmur   . HFrEF (heart failure with reduced ejection fraction) ([place])    a. 07/2016 Echo: EF  55-60%; b. 02/2018 MV: EF 30%; c. 04/2018 Echo: EF 35%; d. 08/2018 Echo: EF 30-35%. Glob HK w/o rwma. Mildly reduced RV fxn. Mod to sev dil LA. Nl AoV fxn.  Marland Kitchen Hx of colonoscopy    2009 by Dr. Laural Golden. Normal except for small submucosal lipoma. No evidence of polyps or colitis.  . Hyperlipidemia   . Hypertension   . Hypogonadism in male 07/25/2015  . Ischemic cardiomyopathy    a. 07/2016 Echo: EF 55-60%; b. 02/2018 MV: EF 30%; c. 04/2018 Echo: EF 35%; d. 08/2018 Echo: EF 30-35%.  . OSA (obstructive sleep apnea)    No CPAP  . Permanent atrial fibrillation ([facility])    a. CHA2DS2VASc = 3-->Coumadin (also mech AVR); b. 06/2018 Amio started 2/2 elevated rates.  . Renal cell cancer ([place])   . Renal cell carcinoma ([place]) 08/04/2016     Assessment/Plan: 4 Days Post-Op Procedure(s) (LRB): KNEE ARTHROSCOPY WITH INCISION AND DRAINAGE (Right) Principal Problem:   Sepsis ([place]) Active Problems:   ESRD (end stage renal disease) (HCC)   CAD (coronary artery disease)   Hyperlipidemia   Anemia in chronic kidney disease   Atrial fibrillation (HCC)   Aortic valve disorder   Below-knee amputation of left lower extremity (HCC)   Septic arthritis ([place])  Estimated body mass index is 24.07 kg/m as calculated from the following:   Height as of this encounter: 5\' 10"  (1.778 m).   Weight as of this encounter: 76.1 kg. Advance diet Up with therapy Continue ABX therapy due to Post-op infection.  Awaiting Culture results  Appreciate Infectious Disease Consult  DVT Prophylaxis - Coumadin   Reche Dixon, PA-C Orthopaedic Surgery 07/06/2020, 7:00 AM

## 2020-07-06 NOTE — Progress Notes (Signed)
Occupational Therapy Treatment Patient Details Name: Jeremy [name] MRN: [id number] DOB: 15-Feb-1956 Today's Date: 07/06/2020    History of present illness Jeremy [name] is a 22yoM who comes [facility] on 3/28 1 day after Rt knee redness, swelling, pain, weakness. Pt admitted with sepsis, underwent Right knee I&D on 3/28. 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 PTA pt was using WC to/from POV via WC, power scooter in house, AMB ~3f distances with HHPT.   OT comments  Pt seen for OT treatment this date to f/u re: safety with ADLs/ADL mobility. Extended time required in all aspects of self care, especially LB self care d/t limited standing tolerance and assistance required for L LE. Pt initially requires CGA for sup to sit and demos G static sitting balance on side of bed. Pt does however, struggle with more dynamic seated movements (demonstrating Fair sitting balance) requiring UEs for support for tasks such as LB dsg. Pt required MIN A to CST with RW from EOB. Demos F static standing balance. Requires MIN A to take small hop steps FWD/BCKWD with MIN verbal cues for sequence/negotiating environment.  Once up to chair, Pt able to perform seated grooming, hygiene, and UB bathing including washing hair with SETUP. Requires MIN A with seated threading of sock/shoe, threads underwear with SETUP. Requires MIN A for standing clothing mgt over hips and MOD A for standing posterior LB bathing. Pt left in chair with all needs met and in reach. RN present in room with student to assess pt's skin. Will continue to follow and continue to anticipate that [facility]    Follow Up Recommendations  Home health OT;Supervision - Intermittent    Equipment Recommendations  3 in 1 bedside commode;Tub/shower seat    Recommendations for Other Services      Precautions / Restrictions  Precautions Precautions: Fall Precaution Comments: acute wound to residual limb s/p fall to stump 2 weeks ago Restrictions Weight Bearing Restrictions: No       Mobility Bed Mobility Overal bed mobility: Needs Assistance Bed Mobility: Supine to Sit     Supine to sit: Min guard;HOB elevated     General bed mobility comments: increased time    Transfers Overall transfer level: Needs assistance Equipment used: Rolling walker (2 wheeled) Transfers: Sit to/from SOmnicareSit to Stand: Min guard;Min assist Stand pivot transfers: Min assist       General transfer comment: pt requires MIN A to push to stand from bed, but CGA from recliner as he benefits from use of arm rests.    Balance Overall balance assessment: Needs assistance   Sitting balance-Leahy Scale: Good     Standing balance support: Bilateral upper extremity supported Standing balance-Leahy Scale: Fair Standing balance comment: requires B UE support to sustain static stand, cannot accept challenge.                           ADL either performed or assessed with clinical judgement   ADL Overall ADL's : Needs assistance/impaired     Grooming: Wash/dry hands;Wash/dry face;Oral care;Set up;Sitting   Upper Body Bathing: Set up;Sitting   Lower Body Bathing: Sit to/from stand;Moderate assistance Lower Body Bathing Details (indicate cue type and reason): pt only requires CGA to CTS from recliner with use of arm rests and RW for balance. However, requires MOD A  for actual posterior LB bathing while in standing as he is unable to remove a hand from the walker while in standing. States at home that he uses removable shower head. Upper Body Dressing : Set up;Sitting   Lower Body Dressing: Minimal assistance;Sitting/lateral leans Lower Body Dressing Details (indicate cue type and reason): to thread sock, shoe and underwear. Pt requires MIN A for standing balance wiht RW to perfomr clothing  mgt over hips.             Functional mobility during ADLs: Minimal assistance;Rolling walker (to take 4-5 FWD/BCKWD and pivoting hop steps from the bed to the window and then back to recliner)       Vision Patient Visual Report: No change from baseline Additional Comments: no glasses   Perception     Praxis      Cognition Arousal/Alertness: Awake/alert Behavior During Therapy: WFL for tasks assessed/performed Overall Cognitive Status: Within Functional Limits for tasks assessed                                          Exercises Other Exercises Other Exercises: OT engages pt in seated UB bathing/dressing and standing LB bathing/dressing with RW for support. Pt tolerates well. Left in chair with RN and student present.   Shoulder Instructions       General Comments      Pertinent Vitals/ Pain       Pain Assessment: Faces Faces Pain Scale: Hurts little more Pain Location: L heel from sleeping/continuous pressure. Pain Descriptors / Indicators: Tender Pain Intervention(s): Monitored during session;Other (comment) (notified RN of pt's L heel pain and somewhat boggy skin texture. requests that care team be floating pt's heel overnight and RN agreeable to passing this along in report and very receptive.)  Home Living                                          Prior Functioning/Environment              Frequency  Min 1X/week        Progress Toward Goals  OT Goals(current goals can now be found in the care plan section)  Progress towards OT goals: Progressing toward goals  Acute Rehab OT Goals Patient Stated Goal: regain strength and prevent falls OT Goal Formulation: With patient Time For Goal Achievement: 07/19/20 Potential to Achieve Goals: Good  Plan Discharge plan remains appropriate    Co-evaluation                 AM-PAC OT "6 Clicks" Daily Activity     Outcome Measure   Help from another person eating  meals?: None Help from another person taking care of personal grooming?: A Little Help from another person toileting, which includes using toliet, bedpan, or urinal?: A Little Help from another person bathing (including washing, rinsing, drying)?: A Little Help from another person to put on and taking off regular upper body clothing?: A Little Help from another person to put on and taking off regular lower body clothing?: A Little 6 Click Score: 19    End of Session Equipment Utilized During Treatment: Rolling walker  OT Visit Diagnosis: Unsteadiness on feet (R26.81);Muscle weakness (generalized) (M62.81)   Activity Tolerance Patient tolerated treatment well   Patient Left in  chair;with call bell/phone within reach;with nursing/sitter in room   Nurse Communication Mobility status        Time: 8325-4982 OT Time Calculation (min): 70 min  Charges: OT General Charges $OT Visit: 1 Visit OT Treatments $Self Care/Home Management : 38-52 mins $Therapeutic Activity: 23-37 mins  Gerrianne Scale, MS, OTR/L ascom [phone] 07/06/20, 10:15 AM

## 2020-07-06 NOTE — Progress Notes (Addendum)
Pt brought CPAP at home but order was not place. MD Damita Dunnings notified. Will continue to monitor.  0037: order was placed by MD Damita Dunnings. CPAP was applied to pt. Will continue to monitor.

## 2020-07-06 NOTE — Progress Notes (Signed)
[place] for Warfarin  Indication: atrial fibrillation & mech AVR.   Allergies  Allergen Reactions  . Statins Other (See Comments)    Arthralgias (intolerance), Myalgias (intolerance)  Pt taking pravastatin   . Gabapentin     Drowsy   . Sertraline Rash    Patient Measurements: Height: 5\' 10"  (177.8 cm) Weight: 76.1 kg (167 lb 12.3 oz) IBW/kg (Calculated) : 73  Vital Signs: Temp: 99.1 F (37.3 C) (04/01 0400) Temp Source: Oral (04/01 0400) BP: 92/62 (04/01 0400) Pulse Rate: 120 (04/01 0400)  Labs: Recent Labs    07/04/20 0544 07/05/20 0611 07/05/20 0920 07/06/20 0631  HGB 9.3* 8.7*  --  8.2*  HCT 30.6* 28.0*  --  26.4*  PLT 104* 100*  --  102*  LABPROT 40.2*  --  36.6* 37.7*  INR 4.3*  --  3.8* 4.0*  CREATININE 7.59* 6.07*  --  7.76*    Estimated Creatinine Clearance: 10.1 mL/min (A) (by C-G formula based on SCr of 7.76 mg/dL (H)).   Medical History: Past Medical History:  Diagnosis Date  . Abnormal stress test 03/19/2018  . 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 Aortic stenosis    a. 2014 s/p mech AVR ([facility])-->chronic coumadin; b. 08/2018 Echo: Mild AS/AI. Nl fxn/gradient.  . Arthritis   . Atrial fibrillation ([place])   . CAD (coronary artery disease)    a. 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 02/2018 MV [facility]): large inf, infsept, inflat infarct w/ min rev, EF 30%; c. 06/2018 Cath: LM min irregs, LAD 60p, 95/30/87m, LCX min irregs, RCA 40p, 42m, 90d, RPAV 90, RPDA 95ost; d. 09/2018 CABGx3 (LIMA->LAD, VG->D1, VG->PDA).  . Chronic anticoagulation 06/2012   a. Coumadin in the setting of mech AVR and afib.  Marland Kitchen COVID-19 03/15/2019  . CPAP (continuous positive airway pressure) dependence   . Dialysis patient [facility]) 04/07/2006   Patient started HD around 1996,  had 1st transplant LLQ around 1998 until 2007 when they found renal cell cancer in transplant and both  native kidneys, all were removed > went back on HD until 2nd transplant RLQ in 2014 which lasted 3 yrs; it was embolized for suspected acute rejection. Is currently on HD MWF in [place] w/ [facility] nephrology.  . ESRD (end stage renal disease) ([place])    a. 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 1999 s/p Kidney transplant; c. 2014 Bilat nephrectomy (transplanted kidney resected) in the setting of renal cell carcinoma; d. PD until 11/2017-->HD.  Marland Kitchen Heart murmur   . HFrEF (heart failure with reduced ejection fraction) ([place])    a. 07/2016 Echo: EF 55-60%; b. 02/2018 MV: EF 30%; c. 04/2018 Echo: EF 35%; d. 08/2018 Echo: EF 30-35%. Glob HK w/o rwma. Mildly reduced RV fxn. Mod to sev dil LA. Nl AoV fxn.  Marland Kitchen Hx of colonoscopy    2009 by Dr. Laural Golden. Normal except for small submucosal lipoma. No evidence of polyps or colitis.  . Hyperlipidemia   . Hypertension   . Hypogonadism in male 07/25/2015  . Ischemic cardiomyopathy    a. 07/2016 Echo: EF 55-60%; b. 02/2018 MV: EF 30%; c. 04/2018 Echo: EF 35%; d. 08/2018 Echo: EF 30-35%.  . OSA (obstructive sleep apnea)    No CPAP  . Permanent atrial fibrillation (HCC)    a. CHA2DS2VASc = 3-->Coumadin (also mech AVR); b. 06/2018 Amio started 2/2 elevated  rates.  . Renal cell cancer ([place])   . Renal cell carcinoma ([place]) 08/04/2016    Medications:  Medications Prior to Admission  Medication Sig Dispense Refill Last Dose  . acetaminophen (TYLENOL) 500 MG tablet Take 1 tablet (500 mg total) by mouth every 4 (four) hours as needed. 30 tablet 0 07/02/2020 at Unknown time  . allopurinol (ZYLOPRIM) 100 MG tablet Take 1 tablet (100 mg total) by mouth daily. 30 tablet 0 07/02/2020 at Unknown time  . CHOLECALCIFEROL PO Take by mouth.   07/02/2020 at Unknown time  . clopidogrel (PLAVIX) 75 MG tablet Take 1 tablet (75 mg total) by mouth daily with breakfast. 90 tablet 3 07/02/2020 at Unknown time  . ezetimibe (ZETIA) 10 MG tablet TAKE 1 TABLET BY MOUTH EVERY DAY (Patient taking  differently: Take 10 mg by mouth daily.) 90 tablet 1 07/02/2020 at Unknown time  . hydroxychloroquine (PLAQUENIL) 200 MG tablet Take 400 mg by mouth at bedtime.   07/02/2020 at Unknown time  . lamoTRIgine (LAMICTAL) 25 MG tablet Take 2 tablets (50 mg total) by mouth 2 (two) times daily. 120 tablet 0 07/02/2020 at Unknown time  . loperamide (IMODIUM A-D) 2 MG tablet Take 2 mg by mouth 4 (four) times daily as needed for diarrhea or loose stools.   Past Week at Unknown time  . megestrol (MEGACE) 40 MG/ML suspension TAKE 10 ML (400 MG TOTAL) BY MOUTH DAILY.   Past Week at Unknown time  . metoprolol tartrate 75 MG TABS Take 75 mg by mouth 2 (two) times daily. 180 tablet 3   . midodrine (PROAMATINE) 10 MG tablet TAKE 1 TABLET BY MOUTH 3 TIMES A WEEK AS DIRECTED TAKE 1 TAB BEFORE DIALYSIS MAY REPEAT ONCE AS NEED   07/02/2020 at Unknown time  . multivitamin (RENA-VIT) TABS tablet Take 1 tablet by mouth daily.   07/02/2020 at Unknown time  . oxyCODONE-acetaminophen (PERCOCET/ROXICET) 5-325 MG tablet Take 1 tablet by mouth every 4 (four) hours as needed.   07/02/2020 at Unknown time  . pantoprazole (PROTONIX) 40 MG tablet Take 1 tablet (40 mg total) by mouth daily. 90 tablet 3 07/02/2020 at Unknown time  . polyethylene glycol (MIRALAX / GLYCOLAX) 17 g packet Take 17 g by mouth daily as needed for mild constipation. 30 each 0 Past Month at Unknown time  . predniSONE (DELTASONE) 5 MG tablet Take 5 mg by mouth daily.   07/02/2020 at Unknown time  . rosuvastatin (CRESTOR) 40 MG tablet Take 1 tablet (40 mg total) by mouth daily at 6 PM. 90 tablet 3 07/02/2020 at Unknown time  . sevelamer carbonate (RENVELA) 800 MG tablet Take 3,200 mg by mouth 3 (three) times daily with meals.   07/02/2020 at Unknown time  . traZODone (DESYREL) 50 MG tablet TAKE 1/2 -1 TABLET (25-50 MG TOTAL) BY MOUTH AT BEDTIME AS NEEDED FOR SLEEP. (Patient taking differently: Take 25-50 mg by mouth at bedtime.) 90 tablet 2 07/02/2020 at Unknown time  .  warfarin (COUMADIN) 1 MG tablet Take 1 to 2 tablets daily as directed by the coumadin clinic (Patient taking differently: Take 1 mg by mouth daily.) 90 tablet 0 07/02/2020 at bedtime  . Epoetin Alfa-epbx (RETACRIT IJ) Epoetin alfa - epbx (Retacrit)     . iron sucrose in sodium chloride 0.9 % 100 mL Iron Sucrose (Venofer)     . lidocaine-prilocaine (EMLA) cream Apply 1 application topically daily as needed (for fistula).     . nitroGLYCERIN (NITROSTAT) 0.4 MG SL tablet Place  1 tablet (0.4 mg total) under the tongue every 5 (five) minutes as needed for chest pain. 25 tablet 3     Assessment: Pharmacy consulted to dose warfarin for Afib in this 65 year old male admitted with septic arthritis.  Pt is s/p I&D of septic knee on 3/28. No major DDI. Unsure reason for INR trending up. Suggested to give PO Vitmin K+ on 3/30, but MD thinks its best to wait and watch.   Home regimen: per last communication for warfarin dosing: 1 mg daily.    Date INR Warfarin Dose  3/28 3.4 HOLD  3/29 3.6 HOLD  3/30 4.3 HOLD  3/31 3.8 HOLD  4/1 4 HOLD    Goal of Therapy:  INR 2-3 Monitor platelets by anticoagulation protocol: Yes   Plan:  INR is supratherapeutic. Will hold warfarin dose tonight. Daily INR ordered. CBC at least every 3 days.    Oswald Hillock, PharmD, BCPS 07/06/2020,7:39 AM

## 2020-07-06 NOTE — Progress Notes (Signed)
PT Cancellation Note  Patient Details Name: Jeremy [name] MRN: [id number] DOB: [birth year]   Cancelled Treatment:    Reason Eval/Treat Not Completed: Medical issues which prohibited therapy (K+ at 5.7 this date, outside of appropriate range for PT services. Will defer treatment to later date/time as appropriate.)   Lavenia Stumpo C 07/06/2020, 8:11 AM

## 2020-07-06 NOTE — Telephone Encounter (Signed)
Refill Request.  

## 2020-07-06 NOTE — Progress Notes (Signed)
   Date of Admission:  07/02/2020     ID: Jeremy [name] is a 65 y.o. male  Principal Problem:   Sepsis ([place]) Active Problems:   ESRD (end stage renal disease) ([place])   CAD (coronary artery disease)   Hyperlipidemia   Anemia in chronic kidney disease   Atrial fibrillation (HCC)   Aortic valve disorder   Below-knee amputation of left lower extremity (HCC)   Septic arthritis ([place])    Subjective: Patient feeling better pain right knee little better.  Medications:  . Chlorhexidine Gluconate Cloth  6 each Topical Q0600  . epoetin (EPOGEN/PROCRIT) injection  10,000 Units Intravenous Q M,W,F-HD  . lamoTRIgine  50 mg Oral BID  . metoprolol tartrate  100 mg Oral BID  . polyethylene glycol  17 g Oral Daily  . senna-docusate  2 tablet Oral BID  . sevelamer carbonate  800 mg Oral TID WC  . traZODone  25-50 mg Oral QHS    Objective: Vital signs in last 24 hours: Temp:  [98.3 F (36.8 C)-99.1 F (37.3 C)] 98.3 F (36.8 C) (04/01 0800) Pulse Rate:  [116-125] 120 (04/01 0800) Resp:  [12-23] 22 (04/01 0800) BP: (92-142)/(62-97) 113/72 (04/01 0800) SpO2:  [96 %-100 %] 100 % (04/01 0800)  PHYSICAL EXAM:  General: Alert, cooperative, no distress, appears stated age.  Lungs: Clear to auscultation bilaterally. No Wheezing or Rhonchi. No rales. Heart: irregular RVR Abdomen: Soft, non-tender,not distended. Bowel sounds normal. No masses Extremities:rt stump- eschar wound Rt knee surgical dressing Skin: No rashes or lesions. Or bruising Lymph: Cervical, supraclavicular normal. Neurologic: Grossly non-focal  Lab Results Recent Labs    07/05/20 0611 07/06/20 0631  WBC 5.2 4.4  HGB 8.7* 8.2*  HCT 28.0* 26.4*  NA 136 133*  K 5.5* 5.7*  CL 96* 94*  CO2 27 25  BUN 41* 57*  CREATININE 6.07* 7.76*   Liver Panel Recent Labs    07/06/20 0631  ALBUMIN 2.8*   Sedimentation Rate Recent Labs    07/05/20 0611  ESRSEDRATE 66*   C-Reactive Protein Recent Labs    07/05/20 0611   CRP 24.8*    Microbiology: 07/02/2020 Synovial fluid culture no growth 06/24/2020 blood culture no growth Studies/Results: No results found.   Assessment/Plan: Right knee acute swelling status post I&D.  The initial arthrocentesis had more than 40 K WBCs.  Gram stain was negative and no crystals. The culture of the synovial fluid is negative as well.  Patient currently on vancomycin and ceftriaxone. 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  Discussed the management with the patient.  ID will follow him peripherally this weekend Call if needed

## 2020-07-06 NOTE — Progress Notes (Signed)
[place], [name] 07/06/20  Subjective:   LOS: 4 Jeremy [name] is a 65 y.o. male with past medical history of ESRD on HD, s/p failed kidney transplant, CAD, A fib, obstructive sleep apnea on CPAP, and PVD with Rt BKA. He presents to the ED with right knee swelling and pain.   He is admitted for further workup of right knee pain  Patient seen resting in bed Alert and oriented Tolerating meals without nausea Denies shortness of breath   Objective:  Vital signs in last 24 hours:  Temp:  [98.3 F (36.8 C)-99.1 F (37.3 C)] 98.3 F (36.8 C) (04/01 0800) Pulse Rate:  [116-126] 126 (04/01 1145) Resp:  [15-23] 16 (04/01 1145) BP: (92-142)/(62-97) 104/72 (04/01 1145) SpO2:  [96 %-100 %] 100 % (04/01 1145)  Weight change:  Filed Weights   07/03/20 0622 07/04/20 0600 07/05/20 0500  Weight: 73.7 kg 75.9 kg 76.1 kg    Intake/Output:    Intake/Output Summary (Last 24 hours) at 07/06/2020 1248 Last data filed at 07/06/2020 0950 Gross per 24 hour  Intake 480 ml  Output --  Net 480 ml     Physical Exam: General: No acute distress  HEENT Normocephalic, moist oral mucosa  Pulm/lungs Clear lungs  CVS/Heart S1S2 present  Abdomen:  Soft, nontener  Extremities: No peripheral edema, Rt BKA, Rt knee ace bandaged  Neurologic: Alert, oriented  Skin: No masses or rashes  Access: Lt AVF        Basic Metabolic Panel:  Recent Labs  Lab 07/02/20 1429 07/03/20 0632 07/04/20 0544 07/04/20 1719 07/05/20 0611 07/06/20 0631  NA 138 134* 132*  --  136 133*  K 4.1 5.6* 6.3* 5.1 5.5* 5.7*  CL 94* 94* 91*  --  96* 94*  CO2 30 28 26   --  27 25  GLUCOSE 95 88 90  --  104* 86  BUN 24* 36* 57*  --  41* 57*  CREATININE 4.21* 5.56* 7.59*  --  6.07* 7.76*  CALCIUM 8.7* 9.0 9.7  --  9.4 9.5  PHOS  --   --   --   --   --  7.9*     CBC: Recent Labs  Lab 07/02/20 1429 07/03/20 0632 07/04/20 0544 07/05/20 0611 07/06/20 0631  WBC 7.5 6.3 7.3 5.2 4.4   NEUTROABS 5.4  --   --   --  2.6  HGB 9.6* 9.2* 9.3* 8.7* 8.2*  HCT 31.4* 30.2* 30.6* 28.0* 26.4*  MCV 104.3* 108.6* 105.9* 104.5* 103.1*  PLT 125* 95* 104* 100* 102*      Lab Results  Component Value Date   HEPBSAG NON REACTIVE 04/13/2020      Microbiology:  Recent Results (from the past 240 hour(s))  Blood culture (single)     Status: None (Preliminary result)   Collection Time: 07/02/20  2:36 PM   Specimen: BLOOD  Result Value Ref Range Status   Specimen Description BLOOD BLOOD RIGHT FOREARM  Final   Special Requests   Final    BOTTLES DRAWN AEROBIC AND ANAEROBIC Blood Culture adequate volume   Culture   Final    NO GROWTH 4 DAYS Performed at [facility], [address] [place], [place] [address]    Report Status PENDING  Incomplete  Blood culture (single)     Status: None (Preliminary result)   Collection Time: 07/02/20  3:37 PM   Specimen: BLOOD  Result Value Ref Range Status   Specimen Description BLOOD RIGHT  ANTECUBITAL  Final   Special Requests   Final    BOTTLES DRAWN AEROBIC AND ANAEROBIC Blood Culture adequate volume   Culture   Final    NO GROWTH 4 DAYS Performed at [facility], [address] [place], [place] [address]    Report Status PENDING  Incomplete  Resp Panel by RT-PCR (Flu A&B, Covid) Nasopharyngeal Swab     Status: None   Collection Time: 07/02/20  3:37 PM   Specimen: Nasopharyngeal Swab; Nasopharyngeal(NP) swabs in vial transport medium  Result Value Ref Range Status   SARS Coronavirus 2 by RT PCR NEGATIVE NEGATIVE Final    Comment: (NOTE) SARS-CoV-2 target nucleic acids are NOT DETECTED.  The SARS-CoV-2 RNA is generally detectable in upper respiratory specimens during the acute phase of infection. The lowest concentration of SARS-CoV-2 viral copies this assay can detect is 138 copies/mL. A negative result does not preclude SARS-Cov-2 infection and should not be used as the sole basis for treatment or other  patient management decisions. A negative result may occur with  improper specimen collection/handling, submission of specimen other than nasopharyngeal swab, presence of viral mutation(s) within the areas targeted by this assay, and inadequate number of viral copies(<138 copies/mL). A negative result must be combined with clinical observations, patient history, and epidemiological information. The expected result is Negative.  Fact Sheet for Patients:  EntrepreneurPulse.com.au  Fact Sheet for Healthcare Providers:  IncredibleEmployment.be  This test is no t yet approved or cleared by the [place] FDA and  has been authorized for detection and/or diagnosis of SARS-CoV-2 by FDA under an Emergency Use Authorization (EUA). This EUA will remain  in effect (meaning this test can be used) for the duration of the COVID-19 declaration under Section 564(b)(1) of the Act, 21 U.S.C.section 360bbb-3(b)(1), unless the authorization is terminated  or revoked sooner.       Influenza A by PCR NEGATIVE NEGATIVE Final   Influenza B by PCR NEGATIVE NEGATIVE Final    Comment: (NOTE) The Xpert Xpress SARS-CoV-2/FLU/RSV plus assay is intended as an aid in the diagnosis of influenza from Nasopharyngeal swab specimens and should not be used as a sole basis for treatment. Nasal washings and aspirates are unacceptable for Xpert Xpress SARS-CoV-2/FLU/RSV testing.  Fact Sheet for Patients: EntrepreneurPulse.com.au  Fact Sheet for Healthcare Providers: IncredibleEmployment.be  This test is not yet approved or cleared by the [place] FDA and has been authorized for detection and/or diagnosis of SARS-CoV-2 by FDA under an Emergency Use Authorization (EUA). This EUA will remain in effect (meaning this test can be used) for the duration of the COVID-19 declaration under Section 564(b)(1) of the Act, 21 U.S.C. section  360bbb-3(b)(1), unless the authorization is terminated or revoked.  Performed at [facility], [address] [place], [place] [address]   Aerobic/Anaerobic Culture w Gram Stain (surgical/deep wound)     Status: None (Preliminary result)   Collection Time: 07/02/20  9:50 PM   Specimen: PATH Other; Body Fluid  Result Value Ref Range Status   Specimen Description KNEE RIGHT JOINT  Final   Special Requests ID 1 PATIENT ON FOLLOWING VANC,ROCEPHIN SWAB  Final   Gram Stain   Final    FEW WBC PRESENT, PREDOMINANTLY PMN NO ORGANISMS SEEN    Culture   Final    NO GROWTH 3 DAYS Performed at [facility], [address] [place], [place] [address]    Report Status PENDING  Incomplete  Aerobic/Anaerobic Culture w Gram Stain (surgical/deep  wound)     Status: None (Preliminary result)   Collection Time: 07/02/20  9:51 PM   Specimen: PATH Other; Body Fluid  Result Value Ref Range Status   Specimen Description KNEE RIGHT JOINT  Final   Special Requests ID 2 SWAB  Final   Gram Stain MODERATE NO ORGANISMS SEEN   Final   Culture   Final    NO GROWTH 3 DAYS Performed at [facility], [address], [place], [place] [address]    Report Status PENDING  Incomplete    Coagulation Studies: Recent Labs    07/04/20 0544 07/05/20 0920 07/06/20 0631  LABPROT 40.2* 36.6* 37.7*  INR 4.3* 3.8* 4.0*    Urinalysis: No results for input(s): COLORURINE, LABSPEC, PHURINE, GLUCOSEU, HGBUR, BILIRUBINUR, KETONESUR, PROTEINUR, UROBILINOGEN, NITRITE, LEUKOCYTESUR in the last 72 hours.  Invalid input(s): APPERANCEUR    Imaging: No results found.   Medications:   . cefTRIAXone (ROCEPHIN)  IV 2 g (07/05/20 1732)  . vancomycin 750 mg (07/04/20 1245)   . Chlorhexidine Gluconate Cloth  6 each Topical Q0600  . epoetin (EPOGEN/PROCRIT) injection  10,000 Units Intravenous Q M,W,F-HD  . lamoTRIgine  50 mg Oral BID  . metoprolol tartrate  100 mg Oral BID  . polyethylene glycol   17 g Oral Daily  . senna-docusate  2 tablet Oral BID  . sevelamer carbonate  800 mg Oral TID WC  . traZODone  25-50 mg Oral QHS   acetaminophen **OR** acetaminophen, morphine injection, ondansetron **OR** ondansetron (ZOFRAN) IV, oxyCODONE-acetaminophen  Assessment/ Plan:  65 y.o. male with past medical history of ESRD on HD, s/p failed kidney transplant, CAD, A fib, obstructive sleep apnea on CPAP, and PVD with Rt BKA. was admitted on 07/02/2020   Principal Problem:   Sepsis ([place]) Active Problems:   ESRD (end stage renal disease) ([place])   CAD (coronary artery disease)   Hyperlipidemia   Anemia in chronic kidney disease   Atrial fibrillation (HCC)   Aortic valve disorder   Below-knee amputation of left lower extremity (HCC)   Septic arthritis (HCC)  Pyogenic arthritis of right knee joint, due to unspecified organism ([place]) [M00.9] Sepsis without acute organ dysfunction, due to unspecified organism ([place]) [A41.9] Sepsis ([place]) [A41.9]   [facility], MWF/ Lt AVF  #. ESRD on HD -Scheduled for dialysis today - no UF -next treatment scheduled for Monday   #. Anemia of Chronic kidney disease  Lab Results  Component Value Date   HGB 8.2 (L) 07/06/2020   EPO prescribed as outpatient EPO with HD   #. Secondary hyperparathyroidism of renal origin N 25.81      Component Value Date/Time   PTH 141 (H) 09/30/2019 2023   PTH Comment 12/14/2015 1059   Lab Results  Component Value Date   PHOS 7.9 (H) 07/06/2020  Sevelamer given with meals TID Monitor phos level during this admission   #. Diabetes type 2 with CKD Hgb A1c MFr Bld (%)  Date Value  09/15/2018 5.9 (H)  Glucose stable  # Hyperkalemia Current level 5.7 Will correct with dialysis   LOS: [place] 4/1/202212:48 PM  [facility] [place], [place]

## 2020-07-06 NOTE — Progress Notes (Signed)
Patient's HR sustaining in the 130s after returning from Dialysis.  Patient BP was slightly lower than previous.  Concern for giving morning metoprolol and evening so close together to help reduce HR.   Morning Metoprolol was held due to dialysis and metoprolol was increased to 100 mg BID today.     Paged MD.    MD said to give oral dose of 100 mg metoprolol now and hold evening dose   Gave dose.  Reported off to night shift nurse to monitor BP and HR

## 2020-07-07 LAB — CBC WITH DIFFERENTIAL/PLATELET
Abs Immature Granulocytes: 0.01 10*3/uL (ref 0.00–0.07)
Basophils Absolute: 0 10*3/uL (ref 0.0–0.1)
Basophils Relative: 1 %
Eosinophils Absolute: 0 10*3/uL (ref 0.0–0.5)
Eosinophils Relative: 0 %
HCT: 27.7 % — ABNORMAL LOW (ref 39.0–52.0)
Hemoglobin: 8.3 g/dL — ABNORMAL LOW (ref 13.0–17.0)
Immature Granulocytes: 0 %
Lymphocytes Relative: 24 %
Lymphs Abs: 0.8 10*3/uL (ref 0.7–4.0)
MCH: 30.9 pg (ref 26.0–34.0)
MCHC: 30 g/dL (ref 30.0–36.0)
MCV: 103 fL — ABNORMAL HIGH (ref 80.0–100.0)
Monocytes Absolute: 0.3 10*3/uL (ref 0.1–1.0)
Monocytes Relative: 8 %
Neutro Abs: 2.4 10*3/uL (ref 1.7–7.7)
Neutrophils Relative %: 67 %
Platelets: 107 10*3/uL — ABNORMAL LOW (ref 150–400)
RBC: 2.69 MIL/uL — ABNORMAL LOW (ref 4.22–5.81)
RDW: 17.4 % — ABNORMAL HIGH (ref 11.5–15.5)
WBC: 3.6 10*3/uL — ABNORMAL LOW (ref 4.0–10.5)
nRBC: 0 % (ref 0.0–0.2)

## 2020-07-07 LAB — BASIC METABOLIC PANEL
Anion gap: 12 (ref 5–15)
BUN: 43 mg/dL — ABNORMAL HIGH (ref 8–23)
CO2: 29 mmol/L (ref 22–32)
Calcium: 9.6 mg/dL (ref 8.9–10.3)
Chloride: 92 mmol/L — ABNORMAL LOW (ref 98–111)
Creatinine, Ser: 6.83 mg/dL — ABNORMAL HIGH (ref 0.61–1.24)
GFR, Estimated: 8 mL/min — ABNORMAL LOW (ref >60)
Glucose, Bld: 110 mg/dL — ABNORMAL HIGH (ref 70–99)
Potassium: 4.3 mmol/L (ref 3.5–5.1)
Sodium: 133 mmol/L — ABNORMAL LOW (ref 135–145)

## 2020-07-07 LAB — CULTURE, BLOOD (SINGLE)
Culture: NO GROWTH
Culture: NO GROWTH
Special Requests: ADEQUATE
Special Requests: ADEQUATE

## 2020-07-07 LAB — PROTIME-INR
INR: 3.4 — ABNORMAL HIGH (ref 0.8–1.2)
Prothrombin Time: 33.2 seconds — ABNORMAL HIGH (ref 11.4–15.2)

## 2020-07-07 LAB — HEPATITIS B SURFACE ANTIBODY, QUANTITATIVE: Hep B S AB Quant (Post): 12.7 m[IU]/mL (ref >9.9)

## 2020-07-07 MED ORDER — WARFARIN SODIUM 1 MG PO TABS
1.0000 mg | ORAL_TABLET | Freq: Once | ORAL | Status: AC
  Administered 2020-07-07: 1 mg via ORAL
  Filled 2020-07-07: qty 1

## 2020-07-07 MED ORDER — ROSUVASTATIN CALCIUM 10 MG PO TABS
40.0000 mg | ORAL_TABLET | Freq: Every day | ORAL | Status: DC
  Administered 2020-07-07: 40 mg via ORAL
  Filled 2020-07-07: qty 4

## 2020-07-07 MED ORDER — WARFARIN - PHARMACIST DOSING INPATIENT: Freq: Every day | Status: DC

## 2020-07-07 MED ORDER — POLYETHYLENE GLYCOL 3350 17 G PO PACK: 17.0000 g | PACK | Freq: Every day | ORAL | Status: DC | PRN

## 2020-07-07 MED ORDER — PANTOPRAZOLE SODIUM 40 MG PO TBEC
40.0000 mg | DELAYED_RELEASE_TABLET | Freq: Every day | ORAL | Status: DC
  Administered 2020-07-07 – 2020-07-08 (×2): 40 mg via ORAL
  Filled 2020-07-07 (×2): qty 1

## 2020-07-07 MED ORDER — SEVELAMER CARBONATE 800 MG PO TABS
3200.0000 mg | ORAL_TABLET | Freq: Three times a day (TID) | ORAL | Status: DC
  Administered 2020-07-07 – 2020-07-08 (×2): 3200 mg via ORAL
  Filled 2020-07-07 (×3): qty 4

## 2020-07-07 MED ORDER — ALLOPURINOL 100 MG PO TABS
100.0000 mg | ORAL_TABLET | Freq: Every day | ORAL | Status: DC
  Administered 2020-07-07 – 2020-07-08 (×2): 100 mg via ORAL
  Filled 2020-07-07 (×2): qty 1

## 2020-07-07 MED ORDER — SODIUM CHLORIDE 0.9 % IV SOLN: 2.0000 g | Freq: Once | INTRAVENOUS | Status: DC

## 2020-07-07 MED ORDER — EZETIMIBE 10 MG PO TABS
10.0000 mg | ORAL_TABLET | Freq: Every day | ORAL | Status: DC
  Administered 2020-07-07 – 2020-07-08 (×2): 10 mg via ORAL
  Filled 2020-07-07 (×2): qty 1

## 2020-07-07 MED ORDER — SODIUM CHLORIDE 0.9 % IV SOLN
1.0000 g | Freq: Once | INTRAVENOUS | Status: AC
  Administered 2020-07-07: 1 g via INTRAVENOUS
  Filled 2020-07-07: qty 1

## 2020-07-07 MED ORDER — CLOPIDOGREL BISULFATE 75 MG PO TABS
75.0000 mg | ORAL_TABLET | Freq: Every day | ORAL | Status: DC
  Administered 2020-07-08: 75 mg via ORAL
  Filled 2020-07-07: qty 1

## 2020-07-07 MED ORDER — HYDROXYCHLOROQUINE SULFATE 200 MG PO TABS
400.0000 mg | ORAL_TABLET | Freq: Every day | ORAL | Status: DC
  Administered 2020-07-07: 400 mg via ORAL
  Filled 2020-07-07 (×2): qty 2

## 2020-07-07 NOTE — Progress Notes (Signed)
   07/06/20 2027  Assess: MEWS Score  Temp (!) 100.6 F (38.1 C)  BP 121/75  Pulse Rate (!) 119  Resp 17  SpO2 96 %  O2 Device Room Air  Assess: MEWS Score  MEWS Temp 1  MEWS Systolic 0  MEWS Pulse 2  MEWS RR 0  MEWS LOC 0  MEWS Score 3  MEWS Score Color Yellow  Assess: if the MEWS score is Yellow or Red  Were vital signs taken at a resting state? Yes  Focused Assessment No change from prior assessment  Early Detection of Sepsis Score *See Row Information* Low  MEWS guidelines implemented *See Row Information* No, previously yellow, continue vital signs every 4 hours  Treat  MEWS Interventions Administered scheduled meds/treatments  Take Vital Signs  Increase Vital Sign Frequency  Yellow: Q 2hr X 2 then Q 4hr X 2, if remains yellow, continue Q 4hrs  Escalate  MEWS: Escalate Yellow: discuss with charge nurse/RN and consider discussing with provider and RRT  Notify: Charge Nurse/RN  Name of Charge Nurse/RN Notified Quincy Simmonds, RN  Date Charge Nurse/RN Notified 07/07/20  Time Charge Nurse/RN Notified 2200

## 2020-07-07 NOTE — Progress Notes (Signed)
Patient has home cpap. Unit is very good condition. Patient states is no. He is able to self manage.

## 2020-07-07 NOTE — Plan of Care (Signed)
  Problem: Education: Goal: Knowledge of General Education information will improve Description: Including pain rating scale, medication(s)/side effects and non-pharmacologic comfort measures Outcome: Progressing   Problem: Clinical Measurements: Goal: Will remain free from infection Outcome: Progressing   Problem: Activity: Goal: Risk for activity intolerance will decrease Outcome: Progressing   

## 2020-07-07 NOTE — Progress Notes (Signed)
[place], [name] 07/07/20  Subjective:   LOS: 5 Jeremy [name] is a 65 y.o. male with past medical history of ESRD on HD, s/p failed kidney transplant, CAD, A fib, obstructive sleep apnea on CPAP, and PVD with Rt BKA. He presents to the ED with right knee swelling and pain.   He is admitted for septic knee (right)  Patient seen resting in bed Alert and oriented Says dialysis went well yesterday Able to tolerate meals without nausea Denies shortness of breath   Objective:  Vital signs in last 24 hours:  Temp:  [97.5 F (36.4 C)-101.3 F (38.5 C)] 97.5 F (36.4 C) (04/02 0725) Pulse Rate:  [74-132] 123 (04/02 0725) Resp:  [10-24] 18 (04/02 0725) BP: (104-156)/(71-92) 135/92 (04/02 0725) SpO2:  [81 %-100 %] 100 % (04/02 0725) Weight:  [75.8 kg] 75.8 kg (04/02 0500)  Weight change:  Filed Weights   07/04/20 0600 07/05/20 0500 07/07/20 0500  Weight: 75.9 kg 76.1 kg 75.8 kg    Intake/Output:    Intake/Output Summary (Last 24 hours) at 07/07/2020 1047 Last data filed at 07/07/2020 1022 Gross per 24 hour  Intake 600 ml  Output 2000 ml  Net -1400 ml     Physical Exam: General: No acute distress  HEENT Normocephalic, moist oral mucosa  Pulm/lungs Clear lungs  CVS/Heart S1S2 present  Abdomen:  Soft, nontener  Extremities: No peripheral edema, Rt BKA, Rt knee ace bandaged  Neurologic: Alert, oriented  Skin: No masses or rashes  Access: Lt AVF        Basic Metabolic Panel:  Recent Labs  Lab 07/03/20 0632 07/04/20 0544 07/04/20 1719 07/05/20 0611 07/06/20 0631 07/07/20 0914  NA 134* 132*  --  136 133* 133*  K 5.6* 6.3* 5.1 5.5* 5.7* 4.3  CL 94* 91*  --  96* 94* 92*  CO2 28 26  --  27 25 29   GLUCOSE 88 90  --  104* 86 110*  BUN 36* 57*  --  41* 57* 43*  CREATININE 5.56* 7.59*  --  6.07* 7.76* 6.83*  CALCIUM 9.0 9.7  --  9.4 9.5 9.6  PHOS  --   --   --   --  7.9*  --      CBC: Recent Labs  Lab 07/02/20 1429 07/03/20 0632  07/04/20 0544 07/05/20 0611 07/06/20 0631 07/07/20 0914  WBC 7.5 6.3 7.3 5.2 4.4 3.6*  NEUTROABS 5.4  --   --   --  2.6 2.4  HGB 9.6* 9.2* 9.3* 8.7* 8.2* 8.3*  HCT 31.4* 30.2* 30.6* 28.0* 26.4* 27.7*  MCV 104.3* 108.6* 105.9* 104.5* 103.1* 103.0*  PLT 125* 95* 104* 100* 102* 107*      Lab Results  Component Value Date   HEPBSAG NON REACTIVE 07/06/2020      Microbiology:  Recent Results (from the past 240 hour(s))  Blood culture (single)     Status: None   Collection Time: 07/02/20  2:36 PM   Specimen: BLOOD  Result Value Ref Range Status   Specimen Description BLOOD BLOOD RIGHT FOREARM  Final   Special Requests   Final    BOTTLES DRAWN AEROBIC AND ANAEROBIC Blood Culture adequate volume   Culture   Final    NO GROWTH 5 DAYS Performed at [facility], [address], [place], [place] [address]    Report Status 07/07/2020 FINAL  Final  Blood culture (single)     Status: None   Collection Time: 07/02/20  3:37 PM   Specimen: BLOOD  Result Value Ref Range Status   Specimen Description BLOOD RIGHT ANTECUBITAL  Final   Special Requests   Final    BOTTLES DRAWN AEROBIC AND ANAEROBIC Blood Culture adequate volume   Culture   Final    NO GROWTH 5 DAYS Performed at [facility], [address] [place], [name] [address]    Report Status 07/07/2020 FINAL  Final  Resp Panel by RT-PCR (Flu A&B, Covid) Nasopharyngeal Swab     Status: None   Collection Time: 07/02/20  3:37 PM   Specimen: Nasopharyngeal Swab; Nasopharyngeal(NP) swabs in vial transport medium  Result Value Ref Range Status   SARS Coronavirus 2 by RT PCR NEGATIVE NEGATIVE Final    Comment: (NOTE) SARS-CoV-2 target nucleic acids are NOT DETECTED.  The SARS-CoV-2 RNA is generally detectable in upper respiratory specimens during the acute phase of infection. The lowest concentration of SARS-CoV-2 viral copies this assay can detect is 138 copies/mL. A negative result does not preclude  SARS-Cov-2 infection and should not be used as the sole basis for treatment or other patient management decisions. A negative result may occur with  improper specimen collection/handling, submission of specimen other than nasopharyngeal swab, presence of viral mutation(s) within the areas targeted by this assay, and inadequate number of viral copies(<138 copies/mL). A negative result must be combined with clinical observations, patient history, and epidemiological information. The expected result is Negative.  Fact Sheet for Patients:  EntrepreneurPulse.com.au  Fact Sheet for Healthcare Providers:  IncredibleEmployment.be  This test is no t yet approved or cleared by the [place] FDA and  has been authorized for detection and/or diagnosis of SARS-CoV-2 by FDA under an Emergency Use Authorization (EUA). This EUA will remain  in effect (meaning this test can be used) for the duration of the COVID-19 declaration under Section 564(b)(1) of the Act, 21 U.S.C.section 360bbb-3(b)(1), unless the authorization is terminated  or revoked sooner.       Influenza A by PCR NEGATIVE NEGATIVE Final   Influenza B by PCR NEGATIVE NEGATIVE Final    Comment: (NOTE) The Xpert Xpress SARS-CoV-2/FLU/RSV plus assay is intended as an aid in the diagnosis of influenza from Nasopharyngeal swab specimens and should not be used as a sole basis for treatment. Nasal washings and aspirates are unacceptable for Xpert Xpress SARS-CoV-2/FLU/RSV testing.  Fact Sheet for Patients: EntrepreneurPulse.com.au  Fact Sheet for Healthcare Providers: IncredibleEmployment.be  This test is not yet approved or cleared by the [place] FDA and has been authorized for detection and/or diagnosis of SARS-CoV-2 by FDA under an Emergency Use Authorization (EUA). This EUA will remain in effect (meaning this test can be used) for the duration of  the COVID-19 declaration under Section 564(b)(1) of the Act, 21 U.S.C. section 360bbb-3(b)(1), unless the authorization is terminated or revoked.  Performed at [facility], [address] [place], [place] [address]   Aerobic/Anaerobic Culture w Gram Stain (surgical/deep wound)     Status: None (Preliminary result)   Collection Time: 07/02/20  9:50 PM   Specimen: PATH Other; Body Fluid  Result Value Ref Range Status   Specimen Description KNEE RIGHT JOINT  Final   Special Requests ID 1 PATIENT ON FOLLOWING VANC,ROCEPHIN SWAB  Final   Gram Stain   Final    FEW WBC PRESENT, PREDOMINANTLY PMN NO ORGANISMS SEEN    Culture   Final    NO GROWTH 3 DAYS Performed at [facility], [address] [place], [place] [address]    Report Status PENDING  Incomplete  Aerobic/Anaerobic Culture w Gram Stain (surgical/deep wound)     Status: None (Preliminary result)   Collection Time: 07/02/20  9:51 PM   Specimen: PATH Other; Body Fluid  Result Value Ref Range Status   Specimen Description KNEE RIGHT JOINT  Final   Special Requests ID 2 SWAB  Final   Gram Stain MODERATE NO ORGANISMS SEEN   Final   Culture   Final    NO GROWTH 3 DAYS Performed at [facility], [address] [place], [place] [address]    Report Status PENDING  Incomplete  Aerobic Culture w Gram Stain (superficial specimen)     Status: None (Preliminary result)   Collection Time: 07/06/20  5:00 PM   Specimen: Stump; Wound  Result Value Ref Range Status   Specimen Description   Final    STUMP Performed at [facility], [address], [place], [place] [address]    Special Requests   Final    NONE Performed at [facility], [place], [place] [address]    Gram Stain   Final    FEW WBC PRESENT, PREDOMINANTLY PMN ABUNDANT GRAM POSITIVE COCCI Performed at [facility], [place] [address], [place],  [address]    Culture PENDING  Incomplete   Report Status  PENDING  Incomplete    Coagulation Studies: Recent Labs    07/05/20 0920 07/06/20 0631 07/07/20 0405  LABPROT 36.6* 37.7* 33.2*  INR 3.8* 4.0* 3.4*    Urinalysis: No results for input(s): COLORURINE, LABSPEC, PHURINE, GLUCOSEU, HGBUR, BILIRUBINUR, KETONESUR, PROTEINUR, UROBILINOGEN, NITRITE, LEUKOCYTESUR in the last 72 hours.  Invalid input(s): APPERANCEUR    Imaging: No results found.   Medications:   . cefTRIAXone (ROCEPHIN)  IV 2 g (07/06/20 1608)  . vancomycin 750 mg (07/06/20 1323)   . Chlorhexidine Gluconate Cloth  6 each Topical Q0600  . epoetin (EPOGEN/PROCRIT) injection  10,000 Units Intravenous Q M,W,F-HD  . lamoTRIgine  50 mg Oral BID  . metoprolol tartrate  100 mg Oral BID  . polyethylene glycol  17 g Oral Daily  . senna-docusate  2 tablet Oral BID  . sevelamer carbonate  800 mg Oral TID WC  . traZODone  25-50 mg Oral QHS   acetaminophen **OR** acetaminophen, morphine injection, ondansetron **OR** ondansetron (ZOFRAN) IV, oxyCODONE-acetaminophen  Assessment/ Plan:  65 y.o. male with past medical history of ESRD on HD, s/p failed kidney transplant, CAD, A fib, obstructive sleep apnea on CPAP, and PVD with Rt BKA. was admitted on 07/02/2020   Principal Problem:   Sepsis ([place]) Active Problems:   ESRD (end stage renal disease) ([place])   CAD (coronary artery disease)   Hyperlipidemia   Anemia in chronic kidney disease   Atrial fibrillation (HCC)   Aortic valve disorder   Below-knee amputation of left lower extremity (HCC)   Septic arthritis (HCC)  Pyogenic arthritis of right knee joint, due to unspecified organism ([place]) [M00.9] Sepsis without acute organ dysfunction, due to unspecified organism ([place]) [A41.9] Sepsis ([place]) [A41.9]   [facility], MWF/ Lt AVF  #. ESRD on HD -Received dialysis yesterday - UF goal 2L: acheived -next treatment scheduled for Monday - Antibiotics needed after discharge - Will require Vancomycin 750mg  and Ceftazidime  1g with each HD treatment through July 29, 2020 -Notified [facility] of this order   #. Anemia of Chronic kidney disease  Lab Results  Component Value Date   HGB 8.3 (L) 07/07/2020   EPO prescribed as outpatient EPO 10000 units with HD   #. Secondary hyperparathyroidism of renal origin N 25.81      Component Value Date/Time   PTH 141 (H) 09/30/2019 2023   PTH Comment 12/14/2015 1059   Lab Results  Component Value Date   PHOS 7.9 (H) 07/06/2020  Sevelamer given with meals  Monitor phos level during this admission   #. Diabetes type 2 with CKD Hgb A1c MFr Bld (%)  Date Value  09/15/2018 5.9 (H)  Glucose stable  # Hyperkalemia Current level 4.3 Corrected with HD   LOS: [place] 4/2/202210:47 [place], [place]

## 2020-07-07 NOTE — Progress Notes (Signed)
Jeremy [name] for Warfarin  Indication: atrial fibrillation & mech AVR.   Allergies  Allergen Reactions  . Statins Other (See Comments)    Arthralgias (intolerance), Myalgias (intolerance)  Pt taking pravastatin   . Gabapentin     Drowsy   . Sertraline Rash    Patient Measurements: Height: 5\' 10"  (177.8 cm) Weight: 75.8 kg (167 lb 1.7 oz) IBW/kg (Calculated) : 73  Vital Signs: Temp: 98.3 F (36.8 C) (04/02 1112) Temp Source: Oral (04/02 1112) BP: 105/69 (04/02 1112) Pulse Rate: 68 (04/02 1112)  Labs: Recent Labs    07/05/20 7673 07/05/20 0920 07/06/20 0631 07/07/20 0405 07/07/20 0914  HGB 8.7*  --  8.2*  --  8.3*  HCT 28.0*  --  26.4*  --  27.7*  PLT 100*  --  102*  --  107*  LABPROT  --  36.6* 37.7* 33.2*  --   INR  --  3.8* 4.0* 3.4*  --   CREATININE 6.07*  --  7.76*  --  6.83*    Estimated Creatinine Clearance: 11.4 mL/min (A) (by C-G formula based on SCr of 6.83 mg/dL (H)).   Medical History: Past Medical History:  Diagnosis Date  . Abnormal stress test 03/19/2018  . 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 Aortic stenosis    a. 2014 s/p mech AVR ([facility])-->chronic coumadin; b. 08/2018 Echo: Mild AS/AI. Nl fxn/gradient.  . Arthritis   . Atrial fibrillation ([place])   . CAD (coronary artery disease)    a. 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 02/2018 MV [facility]): large inf, infsept, inflat infarct w/ min rev, EF 30%; c. 06/2018 Cath: LM min irregs, LAD 60p, 95/30/20m, LCX min irregs, RCA 40p, 59m, 90d, RPAV 90, RPDA 95ost; d. 09/2018 CABGx3 (LIMA->LAD, VG->D1, VG->PDA).  . Chronic anticoagulation 06/2012   a. Coumadin in the setting of mech AVR and afib.  Marland Kitchen COVID-19 03/15/2019  . CPAP (continuous positive airway pressure) dependence   . Dialysis patient [facility]) 04/07/2006   Patient started HD around 1996,  had 1st transplant LLQ around 1998 until 2007 when they  found renal cell cancer in transplant and both native kidneys, all were removed > went back on HD until 2nd transplant RLQ in 2014 which lasted 3 yrs; it was embolized for suspected acute rejection. Is currently on HD MWF in [place] w/ [facility] nephrology.  . ESRD (end stage renal disease) ([employer])    a. 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 1999 s/p Kidney transplant; c. 2014 Bilat nephrectomy (transplanted kidney resected) in the setting of renal cell carcinoma; d. PD until 11/2017-->HD.  Marland Kitchen Heart murmur   . HFrEF (heart failure with reduced ejection fraction) ([place])    a. 07/2016 Echo: EF 55-60%; b. 02/2018 MV: EF 30%; c. 04/2018 Echo: EF 35%; d. 08/2018 Echo: EF 30-35%. Glob HK w/o rwma. Mildly reduced RV fxn. Mod to sev dil LA. Nl AoV fxn.  Marland Kitchen Hx of colonoscopy    2009 by Dr. Laural Golden. Normal except for small submucosal lipoma. No evidence of polyps or colitis.  . Hyperlipidemia   . Hypertension   . Hypogonadism in male 07/25/2015  . Ischemic cardiomyopathy    a. 07/2016 Echo: EF 55-60%; b. 02/2018 MV: EF 30%; c. 04/2018 Echo: EF 35%; d. 08/2018 Echo: EF 30-35%.  . OSA (obstructive sleep apnea)    No CPAP  .  Permanent atrial fibrillation ([facility])    a. CHA2DS2VASc = 3-->Coumadin (also mech AVR); b. 06/2018 Amio started 2/2 elevated rates.  . Renal cell cancer ([place])   . Renal cell carcinoma ([place]) 08/04/2016    Medications:  Medications Prior to Admission  Medication Sig Dispense Refill Last Dose  . acetaminophen (TYLENOL) 500 MG tablet Take 1 tablet (500 mg total) by mouth every 4 (four) hours as needed. 30 tablet 0 07/02/2020 at Unknown time  . allopurinol (ZYLOPRIM) 100 MG tablet Take 1 tablet (100 mg total) by mouth daily. 30 tablet 0 07/02/2020 at Unknown time  . CHOLECALCIFEROL PO Take by mouth.   07/02/2020 at Unknown time  . clopidogrel (PLAVIX) 75 MG tablet Take 1 tablet (75 mg total) by mouth daily with breakfast. 90 tablet 3 07/02/2020 at Unknown time  . ezetimibe (ZETIA) 10 MG tablet TAKE 1  TABLET BY MOUTH EVERY DAY (Patient taking differently: Take 10 mg by mouth daily.) 90 tablet 1 07/02/2020 at Unknown time  . hydroxychloroquine (PLAQUENIL) 200 MG tablet Take 400 mg by mouth at bedtime.   07/02/2020 at Unknown time  . lamoTRIgine (LAMICTAL) 25 MG tablet Take 2 tablets (50 mg total) by mouth 2 (two) times daily. 120 tablet 0 07/02/2020 at Unknown time  . loperamide (IMODIUM A-D) 2 MG tablet Take 2 mg by mouth 4 (four) times daily as needed for diarrhea or loose stools.   Past Week at Unknown time  . megestrol (MEGACE) 40 MG/ML suspension TAKE 10 ML (400 MG TOTAL) BY MOUTH DAILY.   Past Week at Unknown time  . metoprolol tartrate 75 MG TABS Take 75 mg by mouth 2 (two) times daily. 180 tablet 3   . midodrine (PROAMATINE) 10 MG tablet TAKE 1 TABLET BY MOUTH 3 TIMES A WEEK AS DIRECTED TAKE 1 TAB BEFORE DIALYSIS MAY REPEAT ONCE AS NEED   07/02/2020 at Unknown time  . multivitamin (RENA-VIT) TABS tablet Take 1 tablet by mouth daily.   07/02/2020 at Unknown time  . oxyCODONE-acetaminophen (PERCOCET/ROXICET) 5-325 MG tablet Take 1 tablet by mouth every 4 (four) hours as needed.   07/02/2020 at Unknown time  . pantoprazole (PROTONIX) 40 MG tablet Take 1 tablet (40 mg total) by mouth daily. 90 tablet 3 07/02/2020 at Unknown time  . polyethylene glycol (MIRALAX / GLYCOLAX) 17 g packet Take 17 g by mouth daily as needed for mild constipation. 30 each 0 Past Month at Unknown time  . predniSONE (DELTASONE) 5 MG tablet Take 5 mg by mouth daily.   07/02/2020 at Unknown time  . rosuvastatin (CRESTOR) 40 MG tablet Take 1 tablet (40 mg total) by mouth daily at 6 PM. 90 tablet 3 07/02/2020 at Unknown time  . sevelamer carbonate (RENVELA) 800 MG tablet Take 3,200 mg by mouth 3 (three) times daily with meals.   07/02/2020 at Unknown time  . traZODone (DESYREL) 50 MG tablet TAKE 1/2 -1 TABLET (25-50 MG TOTAL) BY MOUTH AT BEDTIME AS NEEDED FOR SLEEP. (Patient taking differently: Take 25-50 mg by mouth at bedtime.) 90  tablet 2 07/02/2020 at Unknown time  . warfarin (COUMADIN) 1 MG tablet Take 1 to 2 tablets daily as directed by the coumadin clinic (Patient taking differently: Take 1 mg by mouth daily.) 90 tablet 0 07/02/2020 at bedtime  . Epoetin Alfa-epbx (RETACRIT IJ) Epoetin alfa - epbx (Retacrit)     . iron sucrose in sodium chloride 0.9 % 100 mL Iron Sucrose (Venofer)     . lidocaine-prilocaine (EMLA) cream Apply  1 application topically daily as needed (for fistula).     . nitroGLYCERIN (NITROSTAT) 0.4 MG SL tablet Place 1 tablet (0.4 mg total) under the tongue every 5 (five) minutes as needed for chest pain. 25 tablet 3     Assessment: Pharmacy consulted to dose warfarin for Afib in this 65 year old male admitted with septic arthritis.  Pt is s/p I&D of septic knee on 3/28. No major DDI. Unsure reason for INR trending up. Suggested to give PO Vitmin K+ on 3/30, but MD thinks its best to wait and watch.   Home regimen: per last communication for warfarin dosing: 1 mg daily.    Date INR Warfarin Dose  3/28 3.4 HOLD  3/29 3.6 HOLD  3/30 4.3 HOLD  3/31 3.8 HOLD  4/1 4 HOLD  4/2 3.4 1mg     Goal of Therapy:  INR 2.5-3.5 (mechanical valve) Monitor platelets by anticoagulation protocol: Yes   Plan:  INR is therapeutic.   Will resume warfarin 1mg  dose tonight.   Daily INR ordered. CBC at least every 3 days.    Lu Duffel, PharmD, BCPS Clinical Pharmacist 07/07/2020 11:26 AM

## 2020-07-07 NOTE — Progress Notes (Signed)
Physical Therapy Treatment Patient Details Name: Jeremy [name] MRN: [id number] DOB: 02-Dec-1955 Today's Date: 07/07/2020    History of Present Illness Jeremy [name] is a 63yoM who comes to [facility] on 3/28 1 day after Rt knee redness, swelling, pain, weakness. Pt admitted with sepsis, underwent Right knee I&D on 3/28. 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 PTA pt was using WC to/from POV via WC, power scooter in house, AMB ~47ft distances with HHPT.    PT Comments    Patient received reclining in bed. Expressed frustration about not being able to go home yet per one of his doctors. States he is feeling well and is agreeable to PT. No pain at rest. States he has no concerns about his mobility going home but does expect to be a bit weak since he has been less active while in the hospital. Patient completed supine to sit mod I with HOB elevated and was able to don his L sock and shoe with supervision for safety. Transferred sit <> stand with RW and min A - SBA for safety and due to difficulty pushing off from low bed or when tired after consecutive sit <> stand exercise. Ambulated ~ 15 feet with RW and CGA with second person for chair follow (safety) before needing to stop due to feeling his left leg was so fatigued it could buckle. HR between 110-124 bpm upon entry to room and stayed in the 120s throughout mobility. SpO2 appeared to stay [facility] (did drop during ambulation while gripping walker tightly and asymptomatic). Patient returned to edge of bed to sit up for his lunch, nursing aware. Needs within reach, bed alarm set. Patient is making good progress towards goals at this point. Patient would benefit from continued skilled physical therapy to address remaining impairments and functional limitations to work towards stated goals and return to PLOF or maximal functional independence.       Follow Up  Recommendations  Home health PT;Supervision for mobility/OOB     Equipment Recommendations  None recommended by PT    Recommendations for Other Services       Precautions / Restrictions Precautions Precautions: Fall Precaution Comments: acute wound to residual limb s/p fall to stump 2 weeks ago    Mobility  Bed Mobility   Bed Mobility: Supine to Sit     Supine to sit: Modified independent (Device/Increase time);HOB elevated     General bed mobility comments: increased time    Transfers Overall transfer level: Needs assistance Equipment used: Rolling walker (2 wheeled) Transfers: Sit to/from Stand Sit to Stand: Min guard;Min assist;Supervision         General transfer comment: Patient completed 9 sit <> stand transfers total using RW. Needed min A when pushing off bed and when more fatigued from multiple reps of sit <> stand. Otherwise able to complete with CGA-Supervision.  Ambulation/Gait Ambulation/Gait assistance: Min guard;+2 safety/equipment Gait Distance (Feet): 15 Feet Assistive device: Rolling walker (2 wheeled)   Gait velocity: very slow   General Gait Details: Pateint ambulated with RW using hop to gait pattern. Additional person with chair follow for safety. Patient reported fatigue in the L LE and stated he was concerned it might buckle when he got to approximately 15 feet. No dizziness. Required cuing for deeper breaths.   Stairs             Wheelchair Mobility    Modified  Rankin (Stroke Patients Only)       Balance Overall balance assessment: Needs assistance Sitting-balance support: No upper extremity supported Sitting balance-Leahy Scale: Good Sitting balance - Comments: steady sitting at edge of bed. Can put shoe/sock on L foot without LOB.   Standing balance support: Bilateral upper extremity supported Standing balance-Leahy Scale: Fair Standing balance comment: requires B UE support to sustain static stand, cannot accept  challenge.                            Cognition Arousal/Alertness: Awake/alert Behavior During Therapy: WFL for tasks assessed/performed Overall Cognitive Status: Within Functional Limits for tasks assessed                                        Exercises Other Exercises Other Exercises: discussed extension AROM and quad set exercise for R knee. Completed sit <> stand chair to RW x 6 (until too fatigued to continue) with CGA - min A to improve funcitonal strength and activity tolerance. Discussed pt's mobility concerns about discharge (has none).    General Comments General comments (skin integrity, edema, etc.): HR stayed in 120s throughout session. SpO2 WFL except during ambulation while gripping RW tightly (which likely caused false reading).      Pertinent Vitals/Pain Faces Pain Scale: Hurts a little bit Pain Location: R tip of residual limb, incision over R knee with activity Pain Descriptors / Indicators: Tender Pain Intervention(s): Limited activity within patient's tolerance;Monitored during session;Repositioned    Home Living                      Prior Function            PT Goals (current goals can now be found in the care plan section) Acute Rehab PT Goals Patient Stated Goal: regain strength and prevent falls PT Goal Formulation: With patient Potential to Achieve Goals: Good Progress towards PT goals: Progressing toward goals    Frequency    7X/week      PT Plan Current plan remains appropriate    Co-evaluation              AM-PAC PT "6 Clicks" Mobility   Outcome Measure  Help needed turning from your back to your side while in a flat bed without using bedrails?: None Help needed moving from lying on your back to sitting on the side of a flat bed without using bedrails?: None Help needed moving to and from a bed to a chair (including a wheelchair)?: A Little Help needed standing up from a chair using your  arms (e.g., wheelchair or bedside chair)?: A Little Help needed to walk in hospital room?: A Little Help needed climbing 3-5 steps with a railing? : Total 6 Click Score: 18    End of Session Equipment Utilized During Treatment: Gait belt Activity Tolerance: Patient tolerated treatment well;Patient limited by fatigue Patient left: in bed;with call bell/phone within reach;with bed alarm set (sitting at edge of bed with tray table in front and grippy sock donned.) Nurse Communication: Mobility status PT Visit Diagnosis: Difficulty in walking, not elsewhere classified (R26.2);Other abnormalities of gait and mobility (R26.89);Muscle weakness (generalized) (M62.81)     Time: 8185-6314 PT Time Calculation (min) (ACUTE ONLY): 40 min  Charges:  $Gait Training: 8-22 mins $Therapeutic Activity: 23-37 mins  Everlean Alstrom. Graylon Good, PT, DPT 07/07/20, 12:32 PM

## 2020-07-07 NOTE — Progress Notes (Signed)
Progress Note    Jeremy [name]  MLY:[id number] DOB: Mar 05, 1956  DOA: 07/02/2020 PCP: Leone Haven, MD      Brief Narrative:    Medical records reviewed and are as summarized below:  Jeremy [name] is a 65 y.o. male with medical history significant for ESRD on hemodialysis on Mondays, Wednesdays and Fridays, s/p failed kidney transplant, history of renal cell carcinoma, CAD h/o CABG and coronary stent, paroxysmal atrial fibrillation, aortic valve disease s/p aortic valve replacement, CAD s/p coronary stent placement, PVD s/p right BKA January 2022, anemia of chronic disease, hyperlipidemia.  He presented to the hospital because of fever, right knee pain and swelling.  He was found to have severe sepsis secondary to right knee septic arthritis.  Right knee synovial fluid was turbid with 65,000 WBCs (98% neutrophils).  He was febrile with temperature 100.8, hypotensive with BP of 85/59, tachycardic with pulse of 127 and tachypneic with respiratory rate of 28.  Lactic acid was 2.6.  He was treated with empiric IV antibiotics.  He was seen in consultation by orthopedic surgeon.  He underwent right knee arthroscopic I&D, right knee partial lateral meniscectomy and right knee synovectomy on 07/02/2020.  ID was consulted to assist with management.  He was also seen by the nephrologist for hemodialysis.      Assessment/Plan:   Principal Problem:   Sepsis ([place]) Active Problems:   ESRD (end stage renal disease) ([place])   CAD (coronary artery disease)   Hyperlipidemia   Anemia in chronic kidney disease   Atrial fibrillation (HCC)   Aortic valve disorder   Below-knee amputation of left lower extremity (HCC)   Septic arthritis (HCC)   Body mass index is 23.98 kg/m.    Fever, severe sepsis secondary to right knee septic arthritis: S/p right knee arthroscopic I&D, right knee partial lateral meniscectomy and right knee synovectomy on 07/02/2020.   Patient was adamant about going home  today.  He said he felt better and wanted to be discharged home.  However, he was told that he was not ready for discharge because of recent fever.  He had threatened to leave [place].  However, he rescinded his decision and decided to stay through the weekend for ongoing management. Case was discussed with infectious disease specialist, Dr. Steva Ready.  She suggested continuing IV vancomycin and substituting IV ceftazidime for ceftriaxone. Case was also discussed with nephrologist, Dr. Candiss Norse.  He said he will coordinate with outpatient hemodialysis center so that patient can have antibiotics during dialysis even if he leaves AMA. Synovial fluid and blood cultures have been negative thus far.  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 Monica Martinez on 07/05/2020.  He recommended outpatient follow-up to discuss right AKA if desired.  ESRD on HD on MWF, hyperkalemia.  S/p failed kidney transplant.  Hyperkalemia has improved.  Follow-up with nephrologist for hemodialysis.  CAD with history of CABG and coronary stent: Resume Plavix and Crestor  Paroxysmal atrial fibrillation, s/p aortic valve replacement: INR is 3.4.  Follow-up with pharmacist for Coumadin management.  Thrombocytopenia: Platelet count is trending up.  Monitor CBC  Chronic sinus tachycardia: Continue metoprolol 100 mg twice daily.  We went over his home medicines.  He said he was taking  prednisone 5 mg daily in the recent past and he thinks it was prescribed for "osteoarthritis" (not rheumatoid arthritis).  He does not think it helps and he wants  prednisone to be held.  He also said he was prescribed Lamictal in the past but he does not remember why it was prescribed.  He said he had told his doctor that he wanted to discontinue Lamictal because it made him very sleepy.  Resume Plavix, Crestor, Plaquenil and allopurinol  Diet Order            Diet renal with  fluid restriction Fluid restriction: 1200 mL Fluid; Room service appropriate? Yes; Fluid consistency: Thin  Diet effective now                    Consultants:  Orthopedic surgeon  Infectious disease  Procedures:  Right knee arthroscopic I&D, right knee partial lateral meniscectomy and right knee synovectomy on 07/02/2020    Medications:   . Chlorhexidine Gluconate Cloth  6 each Topical Q0600  . epoetin (EPOGEN/PROCRIT) injection  10,000 Units Intravenous Q M,W,F-HD  . lamoTRIgine  50 mg Oral BID  . metoprolol tartrate  100 mg Oral BID  . polyethylene glycol  17 g Oral Daily  . senna-docusate  2 tablet Oral BID  . sevelamer carbonate  800 mg Oral TID WC  . traZODone  25-50 mg Oral QHS  . warfarin  1 mg Oral ONCE-1600  . Warfarin - Pharmacist Dosing Inpatient   Does not apply q1600   Continuous Infusions: . cefTAZidime (FORTAZ)  IV    . vancomycin 750 mg (07/06/20 1323)     Anti-infectives (From admission, onward)   Start     Dose/Rate Route Frequency Ordered Stop   07/07/20 1230  cefTAZidime (FORTAZ) 1 g in sodium chloride 0.9 % 100 mL IVPB        1 g 200 mL/hr over 30 Minutes Intravenous  Once 07/07/20 1127     07/07/20 1215  cefTAZidime (FORTAZ) 2 g in sodium chloride 0.9 % 100 mL IVPB  Status:  Discontinued        2 g 200 mL/hr over 30 Minutes Intravenous  Once 07/07/20 1125 07/07/20 1127   07/04/20 1200  vancomycin (VANCOREADY) IVPB 750 mg/150 mL        750 mg 150 mL/hr over 60 Minutes Intravenous Every M-W-F (Hemodialysis) 07/02/20 2348     07/03/20 1600  cefTRIAXone (ROCEPHIN) 2 g in sodium chloride 0.9 % 100 mL IVPB  Status:  Discontinued        2 g 200 mL/hr over 30 Minutes Intravenous Every 24 hours 07/03/20 0442 07/07/20 1125   07/03/20 0445  vancomycin (VANCOCIN) IVPB 1000 mg/200 mL premix  Status:  Discontinued        1,000 mg 200 mL/hr over 60 Minutes Intravenous  Once 07/03/20 0442 07/03/20 0445   07/02/20 2345  vancomycin (VANCOREADY) IVPB 750  mg/150 mL        750 mg 150 mL/hr over 60 Minutes Intravenous  Once 07/02/20 2336 07/03/20 0051   07/02/20 1530  cefTRIAXone (ROCEPHIN) 2 g in sodium chloride 0.9 % 100 mL IVPB        2 g 200 mL/hr over 30 Minutes Intravenous  Once 07/02/20 1515 07/02/20 1621   07/02/20 1515  vancomycin (VANCOCIN) IVPB 1000 mg/200 mL premix        1,000 mg 200 mL/hr over 60 Minutes Intravenous  Once 07/02/20 1515 07/02/20 1734             Family Communication/Anticipated D/C date and plan/Code Status   DVT prophylaxis: Place and maintain sequential compression device Start: 07/03/20 1532  Code Status: Full Code  Family Communication: None  Disposition Plan:    Status is: Inpatient  Remains inpatient appropriate because:IV treatments appropriate due to intensity of illness or inability to take PO   Dispo: The patient is from: Home              Anticipated d/c is to: Home              Patient currently is not medically stable to d/c.   Difficult to place patient No           Subjective:   Interval events noted.  He had fever overnight with T-max of 101.3 F.  No complaints.  Objective:    Vitals:   07/07/20 0349 07/07/20 0500 07/07/20 0725 07/07/20 1112  BP: 121/85  (!) 135/92 105/69  Pulse: 93  (!) 123 68  Resp: 18  18 18   Temp: 98.3 F (36.8 C)  (!) 97.5 F (36.4 C) 98.3 F (36.8 C)  TempSrc:   Oral Oral  SpO2: 90%  100% 94%  Weight:  75.8 kg    Height:       No data found.   Intake/Output Summary (Last 24 hours) at 07/07/2020 1228 Last data filed at 07/07/2020 1022 Gross per 24 hour  Intake 600 ml  Output 2000 ml  Net -1400 ml   Filed Weights   07/04/20 0600 07/05/20 0500 07/07/20 0500  Weight: 75.9 kg 76.1 kg 75.8 kg    Exam:  GEN: NAD SKIN: No rash EYES: EOMI ENT: MMM CV: RRR PULM: CTA B ABD: soft, ND, NT, +BS CNS: AAO x 3, non focal EXT: Right BKA with chronic eschar on the stump.  Incisional wound on the right knee is clean and  dry      Data Reviewed:   I have personally reviewed following labs and imaging studies:  Labs: Labs show the following:   Basic Metabolic Panel: Recent Labs  Lab 07/03/20 0632 07/04/20 0544 07/04/20 1719 07/05/20 0611 07/06/20 0631 07/07/20 0914  NA 134* 132*  --  136 133* 133*  K 5.6* 6.3*   < > 5.5* 5.7* 4.3  CL 94* 91*  --  96* 94* 92*  CO2 28 26  --  27 25 29   GLUCOSE 88 90  --  104* 86 110*  BUN 36* 57*  --  41* 57* 43*  CREATININE 5.56* 7.59*  --  6.07* 7.76* 6.83*  CALCIUM 9.0 9.7  --  9.4 9.5 9.6  PHOS  --   --   --   --  7.9*  --    < > = values in this interval not displayed.   GFR Estimated Creatinine Clearance: 11.4 mL/min (A) (by C-G formula based on SCr of 6.83 mg/dL (H)). Liver Function Tests: Recent Labs  Lab 07/02/20 1429 07/03/20 0632 07/06/20 0631  AST 25 20  --   ALT 18 14  --   ALKPHOS 127* 97  --   BILITOT 1.0 0.8  --   PROT 7.6 6.5  --   ALBUMIN 3.5 2.9* 2.8*   No results for input(s): LIPASE, AMYLASE in the last 168 hours. No results for input(s): AMMONIA in the last 168 hours. Coagulation profile Recent Labs  Lab 07/03/20 0632 07/04/20 0544 07/05/20 0920 07/06/20 0631 07/07/20 0405  INR 3.6* 4.3* 3.8* 4.0* 3.4*    CBC: Recent Labs  Lab 07/02/20 1429 07/03/20 9371 07/04/20 0544 07/05/20 0611 07/06/20 0631 07/07/20 0914  WBC 7.5 6.3 7.3  5.2 4.4 3.6*  NEUTROABS 5.4  --   --   --  2.6 2.4  HGB 9.6* 9.2* 9.3* 8.7* 8.2* 8.3*  HCT 31.4* 30.2* 30.6* 28.0* 26.4* 27.7*  MCV 104.3* 108.6* 105.9* 104.5* 103.1* 103.0*  PLT 125* 95* 104* 100* 102* 107*   Cardiac Enzymes: No results for input(s): CKTOTAL, CKMB, CKMBINDEX, TROPONINI in the last 168 hours. BNP (last 3 results) No results for input(s): PROBNP in the last 8760 hours. CBG: No results for input(s): GLUCAP in the last 168 hours. D-Dimer: No results for input(s): DDIMER in the last 72 hours. Hgb A1c: No results for input(s): HGBA1C in the last 72 hours. Lipid  Profile: No results for input(s): CHOL, HDL, LDLCALC, TRIG, CHOLHDL, LDLDIRECT in the last 72 hours. Thyroid function studies: No results for input(s): TSH, T4TOTAL, T3FREE, THYROIDAB in the last 72 hours.  Invalid input(s): FREET3 Anemia work up: No results for input(s): VITAMINB12, FOLATE, FERRITIN, TIBC, IRON, RETICCTPCT in the last 72 hours. Sepsis Labs: Recent Labs  Lab 07/02/20 1429 07/02/20 1537 07/02/20 1750 07/03/20 5397 07/04/20 0544 07/05/20 0611 07/06/20 0631 07/07/20 0914  PROCALCITON  --   --   --  2.39  --   --   --   --   WBC 7.5  --   --  6.3 7.3 5.2 4.4 3.6*  LATICACIDVEN 2.0* 2.6* 1.3  --   --   --   --   --     Microbiology Recent Results (from the past 240 hour(s))  Blood culture (single)     Status: None   Collection Time: 07/02/20  2:36 PM   Specimen: BLOOD  Result Value Ref Range Status   Specimen Description BLOOD BLOOD RIGHT FOREARM  Final   Special Requests   Final    BOTTLES DRAWN AEROBIC AND ANAEROBIC Blood Culture adequate volume   Culture   Final    NO GROWTH 5 DAYS Performed at [facility], [address] [place], [place] [address]    Report Status 07/07/2020 FINAL  Final  Blood culture (single)     Status: None   Collection Time: 07/02/20  3:37 PM   Specimen: BLOOD  Result Value Ref Range Status   Specimen Description BLOOD RIGHT ANTECUBITAL  Final   Special Requests   Final    BOTTLES DRAWN AEROBIC AND ANAEROBIC Blood Culture adequate volume   Culture   Final    NO GROWTH 5 DAYS Performed at [facility], [address] [place], [place] [address]    Report Status 07/07/2020 FINAL  Final  Resp Panel by RT-PCR (Flu A&B, Covid) Nasopharyngeal Swab     Status: None   Collection Time: 07/02/20  3:37 PM   Specimen: Nasopharyngeal Swab; Nasopharyngeal(NP) swabs in vial transport medium  Result Value Ref Range Status   SARS Coronavirus 2 by RT PCR NEGATIVE NEGATIVE Final    Comment: (NOTE) SARS-CoV-2 target  nucleic acids are NOT DETECTED.  The SARS-CoV-2 RNA is generally detectable in upper respiratory specimens during the acute phase of infection. The lowest concentration of SARS-CoV-2 viral copies this assay can detect is 138 copies/mL. A negative result does not preclude SARS-Cov-2 infection and should not be used as the sole basis for treatment or other patient management decisions. A negative result may occur with  improper specimen collection/handling, submission of specimen other than nasopharyngeal swab, presence of viral mutation(s) within the areas targeted by this assay, and inadequate number of viral copies(<138 copies/mL). A negative result  must be combined with clinical observations, patient history, and epidemiological information. The expected result is Negative.  Fact Sheet for Patients:  EntrepreneurPulse.com.au  Fact Sheet for Healthcare Providers:  IncredibleEmployment.be  This test is no t yet approved or cleared by the [place] FDA and  has been authorized for detection and/or diagnosis of SARS-CoV-2 by FDA under an Emergency Use Authorization (EUA). This EUA will remain  in effect (meaning this test can be used) for the duration of the COVID-19 declaration under Section 564(b)(1) of the Act, 21 U.S.C.section 360bbb-3(b)(1), unless the authorization is terminated  or revoked sooner.       Influenza A by PCR NEGATIVE NEGATIVE Final   Influenza B by PCR NEGATIVE NEGATIVE Final    Comment: (NOTE) The Xpert Xpress SARS-CoV-2/FLU/RSV plus assay is intended as an aid in the diagnosis of influenza from Nasopharyngeal swab specimens and should not be used as a sole basis for treatment. Nasal washings and aspirates are unacceptable for Xpert Xpress SARS-CoV-2/FLU/RSV testing.  Fact Sheet for Patients: EntrepreneurPulse.com.au  Fact Sheet for Healthcare  Providers: IncredibleEmployment.be  This test is not yet approved or cleared by the [place] FDA and has been authorized for detection and/or diagnosis of SARS-CoV-2 by FDA under an Emergency Use Authorization (EUA). This EUA will remain in effect (meaning this test can be used) for the duration of the COVID-19 declaration under Section 564(b)(1) of the Act, 21 U.S.C. section 360bbb-3(b)(1), unless the authorization is terminated or revoked.  Performed at [facility], [address] [place], [place] [address]   Aerobic/Anaerobic Culture w Gram Stain (surgical/deep wound)     Status: None (Preliminary result)   Collection Time: 07/02/20  9:50 PM   Specimen: PATH Other; Body Fluid  Result Value Ref Range Status   Specimen Description KNEE RIGHT JOINT  Final   Special Requests ID 1 PATIENT ON FOLLOWING VANC,ROCEPHIN SWAB  Final   Gram Stain   Final    FEW WBC PRESENT, PREDOMINANTLY PMN NO ORGANISMS SEEN    Culture   Final    NO GROWTH 3 DAYS Performed at [facility], [address], [place], [place] [address]    Report Status PENDING  Incomplete  Aerobic/Anaerobic Culture w Gram Stain (surgical/deep wound)     Status: None (Preliminary result)   Collection Time: 07/02/20  9:51 PM   Specimen: PATH Other; Body Fluid  Result Value Ref Range Status   Specimen Description KNEE RIGHT JOINT  Final   Special Requests ID 2 SWAB  Final   Gram Stain MODERATE NO ORGANISMS SEEN   Final   Culture   Final    NO GROWTH 4 DAYS NO ANAEROBES ISOLATED; CULTURE IN PROGRESS FOR 5 DAYS Performed at [facility], [place] [address] [place], [place] [address]    Report Status PENDING  Incomplete  Aerobic Culture w Gram Stain (superficial specimen)     Status: None (Preliminary result)   Collection Time: 07/06/20  5:00 PM   Specimen: Stump; Wound  Result Value Ref Range Status   Specimen Description   Final    STUMP Performed at [facility],  [address], [place], [place] [address]    Special Requests   Final    NONE Performed at [facility], [place],  [address]    Gram Stain   Final    FEW WBC PRESENT, PREDOMINANTLY PMN ABUNDANT GRAM POSITIVE COCCI Performed at [facility], [place]  [address] [place], [place] [address]    Culture PENDING  Incomplete   Report Status PENDING  Incomplete    Procedures and diagnostic studies:  No results found.             LOS: 5 days   Janki Dike  [facility]   Pager on [email or web address]. If 7PM-7AM, please contact night-coverage at [email or web address]     07/07/2020, 12:28 PM

## 2020-07-08 ENCOUNTER — Other Ambulatory Visit: Payer: Self-pay | Admitting: Family

## 2020-07-08 LAB — AEROBIC/ANAEROBIC CULTURE W GRAM STAIN (SURGICAL/DEEP WOUND)
Culture: NO GROWTH
Culture: NO GROWTH

## 2020-07-08 LAB — PROTIME-INR
INR: 2.5 — ABNORMAL HIGH (ref 0.8–1.2)
Prothrombin Time: 26.1 seconds — ABNORMAL HIGH (ref 11.4–15.2)

## 2020-07-08 MED ORDER — VANCOMYCIN HCL IN DEXTROSE 750-5 MG/150ML-% IV SOLN: 750.0000 mg | INTRAVENOUS | Status: AC

## 2020-07-08 MED ORDER — BATH/SHOWER SEAT MISC: 0 refills | Status: DC

## 2020-07-08 MED ORDER — METOPROLOL TARTRATE 100 MG PO TABS: 100.0000 mg | ORAL_TABLET | Freq: Two times a day (BID) | ORAL | 0 refills | Status: DC

## 2020-07-08 MED ORDER — VANCOMYCIN HCL IN DEXTROSE 750-5 MG/150ML-% IV SOLN
750.0000 mg | INTRAVENOUS | Status: DC
  Filled 2020-07-08: qty 150

## 2020-07-08 MED ORDER — WARFARIN SODIUM 1 MG PO TABS
1.0000 mg | ORAL_TABLET | Freq: Every day | ORAL | Status: DC
Start: 2020-07-08 — End: 2020-08-16

## 2020-07-08 MED ORDER — DEXTROSE 5 % IV SOLN: 1.0000 g | INTRAVENOUS | Status: AC

## 2020-07-08 MED ORDER — WARFARIN SODIUM 1 MG PO TABS
1.0000 mg | ORAL_TABLET | Freq: Once | ORAL | Status: AC
  Administered 2020-07-08: 1 mg via ORAL
  Filled 2020-07-08: qty 1

## 2020-07-08 NOTE — Progress Notes (Signed)
[place], [name] 07/08/20  Subjective:   LOS: 6 Jeremy [name] is a 65 y.o. male with past medical history of ESRD on HD, s/p failed kidney transplant, CAD, A fib, obstructive sleep apnea on CPAP, and PVD with Rt BKA. He presents to the ED with right knee swelling and pain.   He is admitted for septic knee (right)  Patient seen resting in bed Alert and oriented Able to tolerate meals without nausea Denies shortness of breath   Objective:  Vital signs in last 24 hours:  Temp:  [98 F (36.7 C)-98.7 F (37.1 C)] 98.4 F (36.9 C) (04/03 0813) Pulse Rate:  [68-121] 121 (04/03 0812) Resp:  [12-18] 14 (04/03 0812) BP: (105-136)/(64-86) 136/86 (04/03 0812) SpO2:  [94 %-100 %] 100 % (04/03 0812) Weight:  [73.9 kg] 73.9 kg (04/03 0415)  Weight change: -1.864 kg Filed Weights   07/05/20 0500 07/07/20 0500 07/08/20 0415  Weight: 76.1 kg 75.8 kg 73.9 kg    Intake/Output:    Intake/Output Summary (Last 24 hours) at 07/08/2020 0951 Last data filed at 07/07/2020 1700 Gross per 24 hour  Intake 820 ml  Output --  Net 820 ml     Physical Exam: General: No acute distress  HEENT Normocephalic, moist oral mucosa  Pulm/lungs Clear lungs  CVS/Heart S1S2 present  Abdomen:  Soft, nontener  Extremities: No peripheral edema, Rt BKA, Rt knee ace bandaged  Neurologic: Alert, oriented  Skin: No masses or rashes  Access: Lt AVF        Basic Metabolic Panel:  Recent Labs  Lab 07/03/20 0632 07/04/20 0544 07/04/20 1719 07/05/20 0611 07/06/20 0631 07/07/20 0914  NA 134* 132*  --  136 133* 133*  K 5.6* 6.3* 5.1 5.5* 5.7* 4.3  CL 94* 91*  --  96* 94* 92*  CO2 28 26  --  27 25 29   GLUCOSE 88 90  --  104* 86 110*  BUN 36* 57*  --  41* 57* 43*  CREATININE 5.56* 7.59*  --  6.07* 7.76* 6.83*  CALCIUM 9.0 9.7  --  9.4 9.5 9.6  PHOS  --   --   --   --  7.9*  --      CBC: Recent Labs  Lab 07/02/20 1429 07/03/20 0632 07/04/20 0544 07/05/20 0611  07/06/20 0631 07/07/20 0914  WBC 7.5 6.3 7.3 5.2 4.4 3.6*  NEUTROABS 5.4  --   --   --  2.6 2.4  HGB 9.6* 9.2* 9.3* 8.7* 8.2* 8.3*  HCT 31.4* 30.2* 30.6* 28.0* 26.4* 27.7*  MCV 104.3* 108.6* 105.9* 104.5* 103.1* 103.0*  PLT 125* 95* 104* 100* 102* 107*      Lab Results  Component Value Date   HEPBSAG NON REACTIVE 07/06/2020      Microbiology:  Recent Results (from the past 240 hour(s))  Blood culture (single)     Status: None   Collection Time: 07/02/20  2:36 PM   Specimen: BLOOD  Result Value Ref Range Status   Specimen Description BLOOD BLOOD RIGHT FOREARM  Final   Special Requests   Final    BOTTLES DRAWN AEROBIC AND ANAEROBIC Blood Culture adequate volume   Culture   Final    NO GROWTH 5 DAYS Performed at [facility], [address], [place], [place] [address]    Report Status 07/07/2020 FINAL  Final  Blood culture (single)     Status: None   Collection Time: 07/02/20  3:37 PM   Specimen:  BLOOD  Result Value Ref Range Status   Specimen Description BLOOD RIGHT ANTECUBITAL  Final   Special Requests   Final    BOTTLES DRAWN AEROBIC AND ANAEROBIC Blood Culture adequate volume   Culture   Final    NO GROWTH 5 DAYS Performed at [facility], [address] [place], [place] [address]    Report Status 07/07/2020 FINAL  Final  Resp Panel by RT-PCR (Flu A&B, Covid) Nasopharyngeal Swab     Status: None   Collection Time: 07/02/20  3:37 PM   Specimen: Nasopharyngeal Swab; Nasopharyngeal(NP) swabs in vial transport medium  Result Value Ref Range Status   SARS Coronavirus 2 by RT PCR NEGATIVE NEGATIVE Final    Comment: (NOTE) SARS-CoV-2 target nucleic acids are NOT DETECTED.  The SARS-CoV-2 RNA is generally detectable in upper respiratory specimens during the acute phase of infection. The lowest concentration of SARS-CoV-2 viral copies this assay can detect is 138 copies/mL. A negative result does not preclude SARS-Cov-2 infection and should not  be used as the sole basis for treatment or other patient management decisions. A negative result may occur with  improper specimen collection/handling, submission of specimen other than nasopharyngeal swab, presence of viral mutation(s) within the areas targeted by this assay, and inadequate number of viral copies(<138 copies/mL). A negative result must be combined with clinical observations, patient history, and epidemiological information. The expected result is Negative.  Fact Sheet for Patients:  EntrepreneurPulse.com.au  Fact Sheet for Healthcare Providers:  IncredibleEmployment.be  This test is no t yet approved or cleared by the [place] FDA and  has been authorized for detection and/or diagnosis of SARS-CoV-2 by FDA under an Emergency Use Authorization (EUA). This EUA will remain  in effect (meaning this test can be used) for the duration of the COVID-19 declaration under Section 564(b)(1) of the Act, 21 U.S.C.section 360bbb-3(b)(1), unless the authorization is terminated  or revoked sooner.       Influenza A by PCR NEGATIVE NEGATIVE Final   Influenza B by PCR NEGATIVE NEGATIVE Final    Comment: (NOTE) The Xpert Xpress SARS-CoV-2/FLU/RSV plus assay is intended as an aid in the diagnosis of influenza from Nasopharyngeal swab specimens and should not be used as a sole basis for treatment. Nasal washings and aspirates are unacceptable for Xpert Xpress SARS-CoV-2/FLU/RSV testing.  Fact Sheet for Patients: EntrepreneurPulse.com.au  Fact Sheet for Healthcare Providers: IncredibleEmployment.be  This test is not yet approved or cleared by the [place] FDA and has been authorized for detection and/or diagnosis of SARS-CoV-2 by FDA under an Emergency Use Authorization (EUA). This EUA will remain in effect (meaning this test can be used) for the duration of the COVID-19 declaration under Section  564(b)(1) of the Act, 21 U.S.C. section 360bbb-3(b)(1), unless the authorization is terminated or revoked.  Performed at [facility], [address] [place], [place] [address]   Aerobic/Anaerobic Culture w Gram Stain (surgical/deep wound)     Status: None (Preliminary result)   Collection Time: 07/02/20  9:50 PM   Specimen: PATH Other; Body Fluid  Result Value Ref Range Status   Specimen Description KNEE RIGHT JOINT  Final   Special Requests ID 1 PATIENT ON FOLLOWING VANC,ROCEPHIN SWAB  Final   Gram Stain   Final    FEW WBC PRESENT, PREDOMINANTLY PMN NO ORGANISMS SEEN    Culture   Final    NO GROWTH 4 DAYS NO ANAEROBES ISOLATED; CULTURE IN PROGRESS FOR 5 DAYS Performed at [facility], [address] [place], [place] [address]    Report Status PENDING  Incomplete  Aerobic/Anaerobic Culture w Gram Stain (surgical/deep wound)     Status: None (Preliminary result)   Collection Time: 07/02/20  9:51 PM   Specimen: PATH Other; Body Fluid  Result Value Ref Range Status   Specimen Description KNEE RIGHT JOINT  Final   Special Requests ID 2 SWAB  Final   Gram Stain MODERATE NO ORGANISMS SEEN   Final   Culture   Final    NO GROWTH 4 DAYS NO ANAEROBES ISOLATED; CULTURE IN PROGRESS FOR 5 DAYS Performed at [facility], [place] [address] [place], [place] [address]    Report Status PENDING  Incomplete  Aerobic Culture w Gram Stain (superficial specimen)     Status: None (Preliminary result)   Collection Time: 07/06/20  5:00 PM   Specimen: Stump; Wound  Result Value Ref Range Status   Specimen Description   Final    STUMP Performed at [facility], [address] [place], [place] [address]    Special Requests   Final    NONE Performed at [facility], [address] [place], [place] [address]    Gram Stain   Final    FEW WBC PRESENT, PREDOMINANTLY PMN ABUNDANT GRAM POSITIVE COCCI    Culture   Final    CULTURE REINCUBATED FOR BETTER  GROWTH Performed at [facility], [place] [address] [place], [place] [address]    Report Status PENDING  Incomplete    Coagulation Studies: Recent Labs    07/06/20 0631 07/07/20 0405 07/08/20 0539  LABPROT 37.7* 33.2* 26.1*  INR 4.0* 3.4* 2.5*    Urinalysis: No results for input(s): COLORURINE, LABSPEC, PHURINE, GLUCOSEU, HGBUR, BILIRUBINUR, KETONESUR, PROTEINUR, UROBILINOGEN, NITRITE, LEUKOCYTESUR in the last 72 hours.  Invalid input(s): APPERANCEUR    Imaging: No results found.   Medications:   . vancomycin 750 mg (07/06/20 1323)   . allopurinol  100 mg Oral Daily  . Chlorhexidine Gluconate Cloth  6 each Topical Q0600  . clopidogrel  75 mg Oral Q breakfast  . epoetin (EPOGEN/PROCRIT) injection  10,000 Units Intravenous Q M,W,F-HD  . ezetimibe  10 mg Oral Daily  . hydroxychloroquine  400 mg Oral QHS  . metoprolol tartrate  100 mg Oral BID  . pantoprazole  40 mg Oral Daily  . polyethylene glycol  17 g Oral Daily  . rosuvastatin  40 mg Oral q1800  . senna-docusate  2 tablet Oral BID  . sevelamer carbonate  3,200 mg Oral TID WC  . traZODone  25-50 mg Oral QHS  . Warfarin - Pharmacist Dosing Inpatient   Does not apply q1600   acetaminophen **OR** acetaminophen, morphine injection, ondansetron **OR** ondansetron (ZOFRAN) IV, oxyCODONE-acetaminophen, polyethylene glycol  Assessment/ Plan:  65 y.o. male with past medical history of ESRD on HD, s/p failed kidney transplant, CAD, A fib, obstructive sleep apnea on CPAP, and PVD with Rt BKA. was admitted on 07/02/2020   Principal Problem:   Sepsis ([place]) Active Problems:   ESRD (end stage renal disease) ([place])   CAD (coronary artery disease)   Hyperlipidemia   Anemia in chronic kidney disease   Atrial fibrillation (HCC)   Aortic valve disorder   Below-knee amputation of left lower extremity (HCC)   Septic arthritis (HCC)  Pyogenic arthritis of right knee joint, due to unspecified organism ([place]) [M00.9] Sepsis  without acute organ dysfunction, due to unspecified organism ([place]) [A41.9] Sepsis ([place]) [  A41.9]   [facility], MWF/ Lt AVF  #. ESRD on HD -Next treatment scheduled for Monday - Antibiotics needed after discharge - Will require Vancomycin 750mg  and Ceftazidime 1g with each HD treatment through July 29, 2020 -Notified [place] dialysis center of this order   #. Anemia of Chronic kidney disease  Lab Results  Component Value Date   HGB 8.3 (L) 07/07/2020   EPO prescribed as outpatient EPO 10000 units with HD   #. Secondary hyperparathyroidism of renal origin N 25.81      Component Value Date/Time   PTH 141 (H) 09/30/2019 2023   PTH Comment 12/14/2015 1059   Lab Results  Component Value Date   PHOS 7.9 (H) 07/06/2020  Sevelamer given with meals  Monitor phos level during this admission   #. Diabetes type 2 with CKD Hgb A1c MFr Bld (%)  Date Value  09/15/2018 5.9 (H)  Glucose stable  # Hyperkalemia Current level 4.3 Corrected with HD   LOS: [place] 4/3/20229:51 [place], [place]

## 2020-07-08 NOTE — Progress Notes (Signed)
Patient discharged to home. IVs removed, telemetry d/c. Discharge instructions discussed with patient. All discharge documentation completed.

## 2020-07-08 NOTE — Progress Notes (Signed)
Physical Therapy Treatment Patient Details Name: Jeremy [name] MRN: [id number] DOB: [birth year] Today's Date: 07/08/2020    History of Present Illness Khale Nigh is a 13yoM who comes to [facility] on 3/28 1 day after Rt knee redness, swelling, pain, weakness. Pt admitted with sepsis, underwent Right knee I&D on 3/28. 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 PTA pt was using WC to/from POV via WC, power scooter in house, AMB ~88ft distances with HHPT.    PT Comments    Bed mobility without assist.  Pt hopes to discharge home today.  Session focused on functional transfers.  Primarily uses wheelchair to scooter at home.  Stood with RW with min a x 1 for standing tolerance and ex.  Practiced transfers to/from wheelchair x 4 with squat pivot technique.  Needed reminders to flip arm rest up for ease  "Oh yeah, I do do it that way."  He requires min guard/assist for safety and cues.  Does better transferring to right and was encouraged to move chair if able to allow for a safer transfer.  Encouraged gait belt and +1 assist initially and to defer gait with RW until HHPT can work on it with him more.  Voiced understanding.  He is aware of increased general weakness and overall safety is stressed.  Girlfriend can provide +1 assist.   Follow Up Recommendations  Home health PT;Supervision for mobility/OOB     Equipment Recommendations  None recommended by PT    Recommendations for Other Services       Precautions / Restrictions Precautions Precautions: Fall Precaution Comments: acute wound to residual limb s/p fall to stump 2 weeks ago Restrictions Weight Bearing Restrictions: Yes    Mobility  Bed Mobility Overal bed mobility: Modified Independent                  Transfers Overall transfer level: Needs assistance Equipment used: Rolling walker (2 wheeled);None Transfers: Sit to/from  [employer] Sit to Stand: Min assist Stand pivot transfers: Min assist          Ambulation/Gait             General Gait Details: deferred   Stairs             Wheelchair Mobility    Modified Rankin (Stroke Patients Only)       Balance Overall balance assessment: Needs assistance Sitting-balance support: No upper extremity supported Sitting balance-Leahy Scale: Good Sitting balance - Comments: steady sitting at edge of bed. Can put shoe/sock on L foot without LOB.   Standing balance support: Bilateral upper extremity supported Standing balance-Leahy Scale: Fair Standing balance comment: requires B UE support to sustain static stand, cannot accept challenge.                            Cognition Arousal/Alertness: Awake/alert Behavior During Therapy: WFL for tasks assessed/performed Overall Cognitive Status: Within Functional Limits for tasks assessed                                        Exercises      General Comments        Pertinent Vitals/Pain Pain Assessment: Faces Faces Pain Scale: Hurts little more Pain Location: R knee and incision Pain  Descriptors / Indicators: Tender;Sore Pain Intervention(s): Limited activity within patient's tolerance;Monitored during session;Repositioned    Home Living                      Prior Function            PT Goals (current goals can now be found in the care plan section) Progress towards PT goals: Progressing toward goals    Frequency    7X/week      PT Plan Current plan remains appropriate    Co-evaluation              AM-PAC PT "6 Clicks" Mobility   Outcome Measure  Help needed turning from your back to your side while in a flat bed without using bedrails?: None Help needed moving from lying on your back to sitting on the side of a flat bed without using bedrails?: None Help needed moving to and from a bed to a chair (including  a wheelchair)?: A Little Help needed standing up from a chair using your arms (e.g., wheelchair or bedside chair)?: A Little Help needed to walk in hospital room?: A Little Help needed climbing 3-5 steps with a railing? : Total 6 Click Score: 18    End of Session Equipment Utilized During Treatment: Gait belt Activity Tolerance: Patient tolerated treatment well;Patient limited by fatigue Patient left: in bed;with call bell/phone within reach;with bed alarm set (sitting at edge of bed with tray table in front and grippy sock donned.) Nurse Communication: Mobility status PT Visit Diagnosis: Difficulty in walking, not elsewhere classified (R26.2);Other abnormalities of gait and mobility (R26.89);Muscle weakness (generalized) (M62.81)     Time: 3888-2800 PT Time Calculation (min) (ACUTE ONLY): 17 min  Charges:  $Therapeutic Activity: 8-22 mins                    Chesley Noon, PTA 07/08/20, 12:05 PM

## 2020-07-08 NOTE — Progress Notes (Signed)
[place] for Warfarin  Indication: atrial fibrillation & mech AVR.   Allergies  Allergen Reactions  . Statins Other (See Comments)    Arthralgias (intolerance), Myalgias (intolerance)  Pt taking pravastatin   . Gabapentin     Drowsy   . Sertraline Rash    Patient Measurements: Height: 5\' 10"  (177.8 cm) Weight: 73.9 kg (163 lb) IBW/kg (Calculated) : 73  Vital Signs: Temp: 98.4 F (36.9 C) (04/03 0813) Temp Source: Oral (04/03 0813) BP: 136/86 (04/03 0812) Pulse Rate: 121 (04/03 0812)  Labs: Recent Labs    07/06/20 0631 07/07/20 0405 07/07/20 0914 07/08/20 0539  HGB 8.2*  --  8.3*  --   HCT 26.4*  --  27.7*  --   PLT 102*  --  107*  --   LABPROT 37.7* 33.2*  --  26.1*  INR 4.0* 3.4*  --  2.5*  CREATININE 7.76*  --  6.83*  --     Estimated Creatinine Clearance: 11.4 mL/min (A) (by C-G formula based on SCr of 6.83 mg/dL (H)).   Medical History: Past Medical History:  Diagnosis Date  . Abnormal stress test 03/19/2018  . 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 Aortic stenosis    a. 2014 s/p mech AVR ([facility])-->chronic coumadin; b. 08/2018 Echo: Mild AS/AI. Nl fxn/gradient.  . Arthritis   . Atrial fibrillation ([place])   . CAD (coronary artery disease)    a. 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 02/2018 MV [facility]): large inf, infsept, inflat infarct w/ min rev, EF 30%; c. 06/2018 Cath: LM min irregs, LAD 60p, 95/30/73m, LCX min irregs, RCA 40p, 20m, 90d, RPAV 90, RPDA 95ost; d. 09/2018 CABGx3 (LIMA->LAD, VG->D1, VG->PDA).  . Chronic anticoagulation 06/2012   a. Coumadin in the setting of mech AVR and afib.  Marland Kitchen COVID-19 03/15/2019  . CPAP (continuous positive airway pressure) dependence   . Dialysis patient [facility]) 04/07/2006   Patient started HD around 1996,  had 1st transplant LLQ around 1998 until 2007 when they found renal cell cancer in transplant and both native  kidneys, all were removed > went back on HD until 2nd transplant RLQ in 2014 which lasted 3 yrs; it was embolized for suspected acute rejection. Is currently on HD MWF in [place] w/ [facility] nephrology.  . ESRD (end stage renal disease) ([place])    a. 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 1999 s/p Kidney transplant; c. 2014 Bilat nephrectomy (transplanted kidney resected) in the setting of renal cell carcinoma; d. PD until 11/2017-->HD.  Marland Kitchen Heart murmur   . HFrEF (heart failure with reduced ejection fraction) ([place])    a. 07/2016 Echo: EF 55-60%; b. 02/2018 MV: EF 30%; c. 04/2018 Echo: EF 35%; d. 08/2018 Echo: EF 30-35%. Glob HK w/o rwma. Mildly reduced RV fxn. Mod to sev dil LA. Nl AoV fxn.  Marland Kitchen Hx of colonoscopy    2009 by Dr. Laural Golden. Normal except for small submucosal lipoma. No evidence of polyps or colitis.  . Hyperlipidemia   . Hypertension   . Hypogonadism in male 07/25/2015  . Ischemic cardiomyopathy    a. 07/2016 Echo: EF 55-60%; b. 02/2018 MV: EF 30%; c. 04/2018 Echo: EF 35%; d. 08/2018 Echo: EF 30-35%.  . OSA (obstructive sleep apnea)    No CPAP  . Permanent atrial fibrillation (HCC)    a. CHA2DS2VASc = 3-->Coumadin (also mech AVR);  b. 06/2018 Amio started 2/2 elevated rates.  . Renal cell cancer ([place])   . Renal cell carcinoma ([place]) 08/04/2016    Medications:  Medications Prior to Admission  Medication Sig Dispense Refill Last Dose  . acetaminophen (TYLENOL) 500 MG tablet Take 1 tablet (500 mg total) by mouth every 4 (four) hours as needed. 30 tablet 0 07/02/2020 at Unknown time  . allopurinol (ZYLOPRIM) 100 MG tablet Take 1 tablet (100 mg total) by mouth daily. 30 tablet 0 07/02/2020 at Unknown time  . CHOLECALCIFEROL PO Take by mouth.   07/02/2020 at Unknown time  . clopidogrel (PLAVIX) 75 MG tablet Take 1 tablet (75 mg total) by mouth daily with breakfast. 90 tablet 3 07/02/2020 at Unknown time  . ezetimibe (ZETIA) 10 MG tablet TAKE 1 TABLET BY MOUTH EVERY DAY (Patient taking differently:  Take 10 mg by mouth daily.) 90 tablet 1 07/02/2020 at Unknown time  . hydroxychloroquine (PLAQUENIL) 200 MG tablet Take 400 mg by mouth at bedtime.   07/02/2020 at Unknown time  . lamoTRIgine (LAMICTAL) 25 MG tablet Take 2 tablets (50 mg total) by mouth 2 (two) times daily. 120 tablet 0 07/02/2020 at Unknown time  . loperamide (IMODIUM A-D) 2 MG tablet Take 2 mg by mouth 4 (four) times daily as needed for diarrhea or loose stools.   Past Week at Unknown time  . megestrol (MEGACE) 40 MG/ML suspension TAKE 10 ML (400 MG TOTAL) BY MOUTH DAILY.   Past Week at Unknown time  . metoprolol tartrate 75 MG TABS Take 75 mg by mouth 2 (two) times daily. 180 tablet 3   . midodrine (PROAMATINE) 10 MG tablet TAKE 1 TABLET BY MOUTH 3 TIMES A WEEK AS DIRECTED TAKE 1 TAB BEFORE DIALYSIS MAY REPEAT ONCE AS NEED   07/02/2020 at Unknown time  . multivitamin (RENA-VIT) TABS tablet Take 1 tablet by mouth daily.   07/02/2020 at Unknown time  . oxyCODONE-acetaminophen (PERCOCET/ROXICET) 5-325 MG tablet Take 1 tablet by mouth every 4 (four) hours as needed.   07/02/2020 at Unknown time  . pantoprazole (PROTONIX) 40 MG tablet Take 1 tablet (40 mg total) by mouth daily. 90 tablet 3 07/02/2020 at Unknown time  . polyethylene glycol (MIRALAX / GLYCOLAX) 17 g packet Take 17 g by mouth daily as needed for mild constipation. 30 each 0 Past Month at Unknown time  . predniSONE (DELTASONE) 5 MG tablet Take 5 mg by mouth daily.   07/02/2020 at Unknown time  . rosuvastatin (CRESTOR) 40 MG tablet Take 1 tablet (40 mg total) by mouth daily at 6 PM. 90 tablet 3 07/02/2020 at Unknown time  . sevelamer carbonate (RENVELA) 800 MG tablet Take 3,200 mg by mouth 3 (three) times daily with meals.   07/02/2020 at Unknown time  . traZODone (DESYREL) 50 MG tablet TAKE 1/2 -1 TABLET (25-50 MG TOTAL) BY MOUTH AT BEDTIME AS NEEDED FOR SLEEP. (Patient taking differently: Take 25-50 mg by mouth at bedtime.) 90 tablet 2 07/02/2020 at Unknown time  . warfarin  (COUMADIN) 1 MG tablet Take 1 to 2 tablets daily as directed by the coumadin clinic (Patient taking differently: Take 1 mg by mouth daily.) 90 tablet 0 07/02/2020 at bedtime  . Epoetin Alfa-epbx (RETACRIT IJ) Epoetin alfa - epbx (Retacrit)     . iron sucrose in sodium chloride 0.9 % 100 mL Iron Sucrose (Venofer)     . lidocaine-prilocaine (EMLA) cream Apply 1 application topically daily as needed (for fistula).     . nitroGLYCERIN (  NITROSTAT) 0.4 MG SL tablet Place 1 tablet (0.4 mg total) under the tongue every 5 (five) minutes as needed for chest pain. 25 tablet 3     Assessment: Pharmacy consulted to dose warfarin for Afib in this 65 year old male admitted with septic arthritis.  Pt is s/p I&D of septic knee on 3/28. No major DDI. Unsure reason for INR trending up. Suggested to give PO Vitmin K+ on 3/30, but MD thinks its best to wait and watch.   Home regimen: per last communication for warfarin dosing: 1 mg daily.    Date INR Warfarin Dose  3/28 3.4 HOLD  3/29 3.6 HOLD  3/30 4.3 HOLD  3/31 3.8 HOLD  4/1 4 HOLD  4/2 3.4 1mg   4/3 2.5 1mg     Goal of Therapy:  INR 2.5-3.5 (mechanical valve) Monitor platelets by anticoagulation protocol: Yes   Plan:  INR is therapeutic.   Will continue warfarin 1mg  dose tonight.   Daily INR ordered. CBC at least every 3 days.    Lu Duffel, PharmD, BCPS Clinical Pharmacist 07/08/2020 10:30 AM

## 2020-07-08 NOTE — Plan of Care (Signed)
  Problem: Education: Goal: Knowledge of General Education information will improve Description: Including pain rating scale, medication(s)/side effects and non-pharmacologic comfort measures Outcome: Adequate for Discharge   Problem: Clinical Measurements: Goal: Will remain free from infection Outcome: Adequate for Discharge Goal: Diagnostic test results will improve Outcome: Adequate for Discharge   Problem: Activity: Goal: Risk for activity intolerance will decrease Outcome: Adequate for Discharge

## 2020-07-08 NOTE — Discharge Summary (Addendum)
Physician Discharge Summary  Jeremy DOMAGALSKI GGE:[id number] DOB: 10/08/1955 DOA: 07/02/2020  PCP: Leone Haven, MD  Admit date: 07/02/2020 Discharge date: 07/08/2020  Discharge disposition: Home with home health therapy   Recommendations for Outpatient Follow-Up:   Follow-up with PCP in 1 week. Follow-up with orthopedic surgeon, Dr. Leim Fabry, as scheduled in 2 weeks. Follow-up with Dr. Carlis Abbott, vascular surgeon, on 07/10/2020.   Discharge Diagnosis:   Principal Problem:   Sepsis ([place]) Active Problems:   ESRD (end stage renal disease) ([place])   CAD (coronary artery disease)   Hyperlipidemia   Anemia in chronic kidney disease   Atrial fibrillation ([place])   Aortic valve disorder   Below-knee amputation of left lower extremity ([place])   Septic arthritis ([place])    Discharge Condition: Stable.  Diet recommendation:  Diet Order            Diet - low sodium heart healthy           Diet renal with fluid restriction Fluid restriction: 1200 mL Fluid; Room service appropriate? Yes; Fluid consistency: Thin  Diet effective now                   Code Status: Full Code     Hospital Course:    Jeremy [name] is a 65 y.o. male with medical history significant for ESRD on hemodialysis on Mondays, Wednesdays and Fridays, s/p failed kidney transplant, history of renal cell carcinoma, CAD h/o CABG and coronary stent, paroxysmal atrial fibrillation, aortic valve disease s/p aortic valve replacement, CAD s/p coronary stent placement, PVD s/p right BKA January 2022, anemia of chronic disease, hyperlipidemia.  He presented to the hospital because of fever, right knee pain and swelling.  He was found to have severe sepsis secondary to right knee septic arthritis.  Right knee synovial fluid was turbid with 65,000 WBCs (98% neutrophils).  He was febrile with temperature 100.8, hypotensive with BP of 85/59, tachycardic with pulse of 127 and tachypneic with respiratory rate of 28.  Lactic acid was  2.6.  He was treated with empiric IV antibiotics.  He was seen in consultation by orthopedic surgeon.  He underwent right knee arthroscopic I&D, right knee partial lateral meniscectomy and right knee synovectomy on 07/02/2020.  ID was consulted to assist with management.  Case was discussed with ID specialist, Dr. Steva Ready.  She recommended IV vancomycin  750 mg IV and IV ceftazidime 1 g with hemodialysis on Mondays, Wednesdays and Fridays at discharge.  He was also seen by the nephrologist for hemodialysis.  Patient said he no longer wants to continue with prednisone and Lamictal at discharge.  He feels better and wants to be discharged home today.  He is deemed stable for discharge to home today.   Medical Consultants:    Orthopedic surgery  Nephrologist  ID specialist   Discharge Exam:    Vitals:   07/08/20 1053 07/08/20 1240 07/08/20 1241 07/08/20 1545  BP: 132/82 108/73    Pulse: (!) 120 (!) 117    Resp: 13 15    Temp:   97.8 F (36.6 C) 98.3 F (36.8 C)  TempSrc:   Oral Oral  SpO2: 100% 97%    Weight:      Height:         GEN: NAD SKIN: Warm and dry EYES: EOMI ENT: MMM CV: RRR PULM: CTA B ABD: soft, ND, NT, +BS CNS: AAO x 3, non focal EXT: Dressing on right knee surgical wound is  dry and intact.  Chronic eschar on right BKA stump.    The results of significant diagnostics from this hospitalization (including imaging, microbiology, ancillary and laboratory) are listed below for reference.     Procedures and Diagnostic Studies:   DG Chest 2 View  Result Date: 07/02/2020 CLINICAL DATA:  Fever, weakness. EXAM: CHEST - 2 VIEW COMPARISON:  Chest x-ray 04/17/2020, CT chest 04/19/2020 FINDINGS: Similar-appearing cardiac silhouette enlargement. The heart size and mediastinal contours are unchanged. Aortic arch calcifications. Redemonstration of aortic and tricuspid valve replacement. Biapical pleural/pulmonary scarring. No focal consolidation. Question Kerley B lines  along the left lower lobe. No definite increased interstitial markings. No pleural effusion. No pneumothorax. No acute osseous abnormality.  Old healed right rib fractures. IMPRESSION: Question Kerley B lines along the left lower lobe suggestive of pulmonary edema. Electronically Signed   By: Iven Finn M.D.   On: 07/02/2020 15:25   DG Knee Right Port  Result Date: 07/02/2020 CLINICAL DATA:  Right knee pain and swelling. EXAM: PORTABLE RIGHT KNEE - 1-2 VIEW COMPARISON:  May 14, 2017. FINDINGS: Status post right below-knee amputation. Vascular calcifications are noted. No acute fracture or dislocation is noted. Small suprapatellar joint effusion is noted. IMPRESSION: Small suprapatellar joint effusion. No acute fracture or dislocation is noted. Electronically Signed   By: Marijo Conception M.D.   On: 07/02/2020 16:29     Labs:   Basic Metabolic Panel: Recent Labs  Lab 07/03/20 7062 07/04/20 0544 07/04/20 1719 07/05/20 0611 07/06/20 0631 07/07/20 0914  NA 134* 132*  --  136 133* 133*  K 5.6* 6.3*   < > 5.5* 5.7* 4.3  CL 94* 91*  --  96* 94* 92*  CO2 28 26  --  27 25 29   GLUCOSE 88 90  --  104* 86 110*  BUN 36* 57*  --  41* 57* 43*  CREATININE 5.56* 7.59*  --  6.07* 7.76* 6.83*  CALCIUM 9.0 9.7  --  9.4 9.5 9.6  PHOS  --   --   --   --  7.9*  --    < > = values in this interval not displayed.   GFR Estimated Creatinine Clearance: 11.4 mL/min (A) (by C-G formula based on SCr of 6.83 mg/dL (H)). Liver Function Tests: Recent Labs  Lab 07/02/20 1429 07/03/20 0632 07/06/20 0631  AST 25 20  --   ALT 18 14  --   ALKPHOS 127* 97  --   BILITOT 1.0 0.8  --   PROT 7.6 6.5  --   ALBUMIN 3.5 2.9* 2.8*   No results for input(s): LIPASE, AMYLASE in the last 168 hours. No results for input(s): AMMONIA in the last 168 hours. Coagulation profile Recent Labs  Lab 07/04/20 0544 07/05/20 0920 07/06/20 0631 07/07/20 0405 07/08/20 0539  INR 4.3* 3.8* 4.0* 3.4* 2.5*     CBC: Recent Labs  Lab 07/02/20 1429 07/03/20 3762 07/04/20 0544 07/05/20 0611 07/06/20 0631 07/07/20 0914  WBC 7.5 6.3 7.3 5.2 4.4 3.6*  NEUTROABS 5.4  --   --   --  2.6 2.4  HGB 9.6* 9.2* 9.3* 8.7* 8.2* 8.3*  HCT 31.4* 30.2* 30.6* 28.0* 26.4* 27.7*  MCV 104.3* 108.6* 105.9* 104.5* 103.1* 103.0*  PLT 125* 95* 104* 100* 102* 107*   Cardiac Enzymes: No results for input(s): CKTOTAL, CKMB, CKMBINDEX, TROPONINI in the last 168 hours. BNP: Invalid input(s): POCBNP CBG: No results for input(s): GLUCAP in the last 168 hours. D-Dimer No results for  input(s): DDIMER in the last 72 hours. Hgb A1c No results for input(s): HGBA1C in the last 72 hours. Lipid Profile No results for input(s): CHOL, HDL, LDLCALC, TRIG, CHOLHDL, LDLDIRECT in the last 72 hours. Thyroid function studies No results for input(s): TSH, T4TOTAL, T3FREE, THYROIDAB in the last 72 hours.  Invalid input(s): FREET3 Anemia work up No results for input(s): VITAMINB12, FOLATE, FERRITIN, TIBC, IRON, RETICCTPCT in the last 72 hours. Microbiology Recent Results (from the past 240 hour(s))  Blood culture (single)     Status: None   Collection Time: 07/02/20  2:36 PM   Specimen: BLOOD  Result Value Ref Range Status   Specimen Description BLOOD BLOOD RIGHT FOREARM  Final   Special Requests   Final    BOTTLES DRAWN AEROBIC AND ANAEROBIC Blood Culture adequate volume   Culture   Final    NO GROWTH 5 DAYS Performed at [facility], [address], [place], [place] [address]    Report Status 07/07/2020 FINAL  Final  Blood culture (single)     Status: None   Collection Time: 07/02/20  3:37 PM   Specimen: BLOOD  Result Value Ref Range Status   Specimen Description BLOOD RIGHT ANTECUBITAL  Final   Special Requests   Final    BOTTLES DRAWN AEROBIC AND ANAEROBIC Blood Culture adequate volume   Culture   Final    NO GROWTH 5 DAYS Performed at [facility], [address] [place], [place]  [address]    Report Status 07/07/2020 FINAL  Final  Resp Panel by RT-PCR (Flu A&B, Covid) Nasopharyngeal Swab     Status: None   Collection Time: 07/02/20  3:37 PM   Specimen: Nasopharyngeal Swab; Nasopharyngeal(NP) swabs in vial transport medium  Result Value Ref Range Status   SARS Coronavirus 2 by RT PCR NEGATIVE NEGATIVE Final    Comment: (NOTE) SARS-CoV-2 target nucleic acids are NOT DETECTED.  The SARS-CoV-2 RNA is generally detectable in upper respiratory specimens during the acute phase of infection. The lowest concentration of SARS-CoV-2 viral copies this assay can detect is 138 copies/mL. A negative result does not preclude SARS-Cov-2 infection and should not be used as the sole basis for treatment or other patient management decisions. A negative result may occur with  improper specimen collection/handling, submission of specimen other than nasopharyngeal swab, presence of viral mutation(s) within the areas targeted by this assay, and inadequate number of viral copies(<138 copies/mL). A negative result must be combined with clinical observations, patient history, and epidemiological information. The expected result is Negative.  Fact Sheet for Patients:  EntrepreneurPulse.com.au  Fact Sheet for Healthcare Providers:  IncredibleEmployment.be  This test is no t yet approved or cleared by the [place] FDA and  has been authorized for detection and/or diagnosis of SARS-CoV-2 by FDA under an Emergency Use Authorization (EUA). This EUA will remain  in effect (meaning this test can be used) for the duration of the COVID-19 declaration under Section 564(b)(1) of the Act, 21 U.S.C.section 360bbb-3(b)(1), unless the authorization is terminated  or revoked sooner.       Influenza A by PCR NEGATIVE NEGATIVE Final   Influenza B by PCR NEGATIVE NEGATIVE Final    Comment: (NOTE) The Xpert Xpress SARS-CoV-2/FLU/RSV plus assay is intended as an  aid in the diagnosis of influenza from Nasopharyngeal swab specimens and should not be used as a sole basis for treatment. Nasal washings and aspirates are unacceptable for Xpert Xpress SARS-CoV-2/FLU/RSV testing.  Fact Sheet for Patients: EntrepreneurPulse.com.au  Fact Sheet for Healthcare Providers: IncredibleEmployment.be  This test is not yet approved or cleared by the [place] FDA and has been authorized for detection and/or diagnosis of SARS-CoV-2 by FDA under an Emergency Use Authorization (EUA). This EUA will remain in effect (meaning this test can be used) for the duration of the COVID-19 declaration under Section 564(b)(1) of the Act, 21 U.S.C. section 360bbb-3(b)(1), unless the authorization is terminated or revoked.  Performed at [facility], [address] [place], [place] [address]   Aerobic/Anaerobic Culture w Gram Stain (surgical/deep wound)     Status: None   Collection Time: 07/02/20  9:50 PM   Specimen: PATH Other; Body Fluid  Result Value Ref Range Status   Specimen Description KNEE RIGHT JOINT  Final   Special Requests ID 1 PATIENT ON FOLLOWING VANC,ROCEPHIN SWAB  Final   Gram Stain   Final    FEW WBC PRESENT, PREDOMINANTLY PMN NO ORGANISMS SEEN    Culture   Final    No growth aerobically or anaerobically. Performed at [facility], [place] [address], [place], [place] [address]    Report Status 07/08/2020 FINAL  Final  Aerobic/Anaerobic Culture w Gram Stain (surgical/deep wound)     Status: None   Collection Time: 07/02/20  9:51 PM   Specimen: PATH Other; Body Fluid  Result Value Ref Range Status   Specimen Description KNEE RIGHT JOINT  Final   Special Requests ID 2 SWAB  Final   Gram Stain MODERATE NO ORGANISMS SEEN   Final   Culture   Final    No growth aerobically or anaerobically. Performed at [facility], [place] [address] [place], [place] [address]    Report Status 07/08/2020  FINAL  Final  Aerobic Culture w Gram Stain (superficial specimen)     Status: None (Preliminary result)   Collection Time: 07/06/20  5:00 PM   Specimen: Stump; Wound  Result Value Ref Range Status   Specimen Description   Final    STUMP Performed at [facility], [address], [place], [place] [address]    Special Requests   Final    NONE Performed at [facility], [address] [place], [place] [address]    Gram Stain   Final    FEW WBC PRESENT, PREDOMINANTLY PMN ABUNDANT GRAM POSITIVE COCCI    Culture   Final    CULTURE REINCUBATED FOR BETTER GROWTH Performed at [facility], [place] [address] [place], [place] [address]    Report Status PENDING  Incomplete     Discharge Instructions:   Discharge Instructions    Diet - low sodium heart healthy   Complete by: As directed    Discharge instructions   Complete by: As directed    Follow up with Dr. Carlis Abbott, vascular surgeon, as scheduled for 4/5//2022   Discharge wound care:   Complete by: As directed    Follow up wit Dr. Leim Fabry, orthopedic surgeon, for wound care   Increase activity slowly   Complete by: As directed      Allergies as of 07/08/2020      Reactions   Statins Other (See Comments)   Arthralgias (intolerance), Myalgias (intolerance) Pt taking pravastatin    Gabapentin    Drowsy    Sertraline Rash      Medication List    STOP taking these medications   iron sucrose in sodium chloride 0.9 % 100 mL   lamoTRIgine 25 MG tablet Commonly  known as: LAMICTAL   predniSONE 5 MG tablet Commonly known as: DELTASONE   RETACRIT IJ     TAKE these medications   acetaminophen 500 MG tablet Commonly known as: TYLENOL Take 1 tablet (500 mg total) by mouth every 4 (four) hours as needed.   allopurinol 100 MG tablet Commonly known as: ZYLOPRIM Take 1 tablet (100 mg total) by mouth daily.   cefTAZidime 1 g in dextrose 5 % 50 mL Inject 1 g into the vein every Monday, Wednesday,  and Friday with hemodialysis for 20 days. Start taking on: July 09, 2020   CHOLECALCIFEROL PO Take by mouth.   clopidogrel 75 MG tablet Commonly known as: PLAVIX Take 1 tablet (75 mg total) by mouth daily with breakfast.   ezetimibe 10 MG tablet Commonly known as: ZETIA TAKE 1 TABLET BY MOUTH EVERY DAY   hydroxychloroquine 200 MG tablet Commonly known as: PLAQUENIL Take 400 mg by mouth at bedtime.   lidocaine-prilocaine cream Commonly known as: EMLA Apply 1 application topically daily as needed (for fistula).   loperamide 2 MG tablet Commonly known as: IMODIUM A-D Take 2 mg by mouth 4 (four) times daily as needed for diarrhea or loose stools.   megestrol 40 MG/ML suspension Commonly known as: MEGACE TAKE 10 ML (400 MG TOTAL) BY MOUTH DAILY.   metoprolol tartrate 100 MG tablet Commonly known as: LOPRESSOR Take 1 tablet (100 mg total) by mouth 2 (two) times daily. What changed:   medication strength  how much to take   midodrine 10 MG tablet Commonly known as: PROAMATINE TAKE 1 TABLET BY MOUTH 3 TIMES A WEEK AS DIRECTED TAKE 1 TAB BEFORE DIALYSIS MAY REPEAT ONCE AS NEED   multivitamin Tabs tablet Take 1 tablet by mouth daily.   nitroGLYCERIN 0.4 MG SL tablet Commonly known as: NITROSTAT Place 1 tablet (0.4 mg total) under the tongue every 5 (five) minutes as needed for chest pain.   oxyCODONE-acetaminophen 5-325 MG tablet Commonly known as: PERCOCET/ROXICET Take 1 tablet by mouth every 4 (four) hours as needed.   pantoprazole 40 MG tablet Commonly known as: PROTONIX Take 1 tablet (40 mg total) by mouth daily.   polyethylene glycol 17 g packet Commonly known as: MIRALAX / GLYCOLAX Take 17 g by mouth daily as needed for mild constipation.   rosuvastatin 40 MG tablet Commonly known as: CRESTOR Take 1 tablet (40 mg total) by mouth daily at 6 PM.   sevelamer carbonate 800 MG tablet Commonly known as: RENVELA Take 3,200 mg by mouth 3 (three) times daily  with meals.   traZODone 50 MG tablet Commonly known as: DESYREL TAKE 1/2 -1 TABLET (25-50 MG TOTAL) BY MOUTH AT BEDTIME AS NEEDED FOR SLEEP. What changed: See the new instructions.   Vancomycin 750-5 MG/150ML-% Soln Commonly known as: VANCOCIN Inject 150 mLs (750 mg total) into the vein every Monday, Wednesday, and Friday with hemodialysis for 20 days. Start taking on: July 09, 2020   warfarin 1 MG tablet Commonly known as: COUMADIN Take as directed. If you are unsure how to take this medication, talk to your nurse or doctor. Original instructions: Take 1 tablet (1 mg total) by mouth daily.            Discharge Care Instructions  (From admission, onward)         Start     Ordered   07/08/20 0000  Discharge wound care:       Comments: Follow up wit Dr. Leim Fabry, orthopedic surgeon,  for wound care   07/08/20 1545          Follow-up Information    Leim Fabry, MD. Call in 2 week(s).   Specialty: Orthopedic Surgery Why: For suture removal and wound check Contact information: [place] [address] [phone]                Time coordinating discharge: 35 minutes  Signed:  Jennye Boroughs  [facility] 07/08/2020, 4:10 PM   Pager on [email or web address]. If 7PM-7AM, please contact night-coverage at [email or web address]

## 2020-07-08 NOTE — TOC Transition Note (Signed)
Transition of Care [facility]) - CM/SW Discharge Note   Patient Details  Name: Jeremy [name] MRN: [id number] Date of Birth: [birth year]  Transition of [facility] CM/SW Contact:  Izola Price, RN Phone Number: 07/08/2020, 4:10 PM   Clinical Narrative:  Patient being discharged today. Already current with [facility] and has all recommended equipment at home. Contacted Jason at [facility] to ensure they will resume services and he confirmed this. Communicated with provider regarding these discharge plans. Simmie Davies RN CM      Final next level of care: [place] Barriers to Discharge: Barriers Resolved   Patient Goals and CMS Choice        Discharge Placement                       Discharge Plan and Services                DME Arranged: N/A (Patient has equipment at home PTA) DME Agency: NA       HH Arranged: OT,PT [place]: [place] ([place]) (Was current with [facility] PTA) Date HH Agency Contacted: 07/08/20 Time [place]: 1610 Representative spoke with at [place]: [place] ([facility]) Interventions     Readmission Risk Interventions Readmission Risk Prevention Plan 04/23/2020 10/06/2019 10/06/2019  Transportation Screening Complete - Complete  PCP or Specialist Appt within 3-5 Days Not Complete - -  Not Complete comments INPT rehab - -  [place] or [place] Complete Complete Complete  Social Work Consult for [place] Planning/Counseling Complete - -  Palliative Care Screening Not Applicable Not Applicable Not Applicable  Medication Review (RN Care Manager) Complete Complete Complete  PCP or Specialist appointment within 3-5 days of discharge - - -  [place] recent data might be hidden

## 2020-07-09 ENCOUNTER — Telehealth: Payer: Self-pay

## 2020-07-09 DIAGNOSIS — A499 Bacterial infection, unspecified: Secondary | ICD-10-CM | POA: Diagnosis not present

## 2020-07-09 DIAGNOSIS — D631 Anemia in chronic kidney disease: Secondary | ICD-10-CM | POA: Diagnosis not present

## 2020-07-09 DIAGNOSIS — N186 End stage renal disease: Secondary | ICD-10-CM | POA: Diagnosis not present

## 2020-07-09 DIAGNOSIS — D509 Iron deficiency anemia, unspecified: Secondary | ICD-10-CM | POA: Diagnosis not present

## 2020-07-09 DIAGNOSIS — N2581 Secondary hyperparathyroidism of renal origin: Secondary | ICD-10-CM | POA: Diagnosis not present

## 2020-07-09 DIAGNOSIS — Z992 Dependence on renal dialysis: Secondary | ICD-10-CM | POA: Diagnosis not present

## 2020-07-09 LAB — AEROBIC CULTURE W GRAM STAIN (SUPERFICIAL SPECIMEN)

## 2020-07-09 NOTE — Telephone Encounter (Signed)
Pt discharged yesterday 4/3 from [facility] d/t sepsis, s/p right BKA Jan 2022.  Last ov with Dr. [name] 03/13/20, Metoprolol tartrate decr to 75mg  BID at this visit.  On hospital discharge, Metoprolol tartrate incr to 100mg  BID and given 1 month supply with no refills.  Currently scheduled for follow up with Dr. [name] 09/20/20. Called and notified pt we will move his f/u sooner to evaluate after hosp discharge and medications.  Pt verbalized understanding and aware will receive call from scheduling.  Refill request denied today and pt will follow up prior to refill.  Forwarding to scheduling.

## 2020-07-09 NOTE — Telephone Encounter (Signed)
Please advise if ok to refill Metoprolol Tartrate 100 mg bid. Pt taking Per recent ED visit 06/2020.

## 2020-07-09 NOTE — Progress Notes (Signed)
ID Was informed by [facility] on Saturday about pt wanting to leave. He was discharged on 07/08/20 Vanco 750mg  during dialysis  Ceftazidime 1-1 -2 gms during dialysis for 4 weeks. 08/02/20 He need to follow up with Vascular at [facility] informed Nephrologist before discharge.

## 2020-07-09 NOTE — Telephone Encounter (Signed)
Pt at dialysis and states he will call back to schedule

## 2020-07-09 NOTE — Telephone Encounter (Signed)
I have made the 1st attempt to contact the patient or family member in charge, in order to follow up from recently being discharged from the hospital. I left a message on voicemail requesting a CB. -MM 

## 2020-07-10 ENCOUNTER — Other Ambulatory Visit
Admission: RE | Admit: 2020-07-10 | Discharge: 2020-07-10 | Disposition: A | Discharge status: Home / Self Care | Payer: Medicare Other | Source: Ambulatory Visit | Attending: Vascular Surgery | Admitting: Vascular Surgery

## 2020-07-10 ENCOUNTER — Other Ambulatory Visit: Payer: Self-pay

## 2020-07-10 ENCOUNTER — Encounter: Payer: Self-pay | Admitting: Cardiovascular Disease

## 2020-07-10 ENCOUNTER — Ambulatory Visit (INDEPENDENT_AMBULATORY_CARE_PROVIDER_SITE_OTHER): Payer: Medicare Other | Admitting: Physician Assistant

## 2020-07-10 ENCOUNTER — Other Ambulatory Visit: Payer: Self-pay | Admitting: Physician Assistant

## 2020-07-10 VITALS — BP 116/71 | HR 103 | Temp 98.2°F | Resp 20 | Ht 70.0 in | Wt 157.6 lb

## 2020-07-10 DIAGNOSIS — I4821 Permanent atrial fibrillation: Secondary | ICD-10-CM | POA: Diagnosis not present

## 2020-07-10 DIAGNOSIS — T8189XD Other complications of procedures, not elsewhere classified, subsequent encounter: Secondary | ICD-10-CM

## 2020-07-10 DIAGNOSIS — Z20822 Contact with and (suspected) exposure to covid-19: Secondary | ICD-10-CM | POA: Insufficient documentation

## 2020-07-10 DIAGNOSIS — N186 End stage renal disease: Secondary | ICD-10-CM

## 2020-07-10 DIAGNOSIS — I480 Paroxysmal atrial fibrillation: Secondary | ICD-10-CM

## 2020-07-10 DIAGNOSIS — Z01812 Encounter for preprocedural laboratory examination: Secondary | ICD-10-CM | POA: Diagnosis not present

## 2020-07-10 DIAGNOSIS — I739 Peripheral vascular disease, unspecified: Secondary | ICD-10-CM

## 2020-07-10 DIAGNOSIS — S88111A Complete traumatic amputation at level between knee and ankle, right lower leg, initial encounter: Secondary | ICD-10-CM

## 2020-07-10 LAB — SARS CORONAVIRUS 2 (TAT 6-24 HRS): SARS Coronavirus 2: NEGATIVE

## 2020-07-10 LAB — POCT INR: INR: 3.3 — AB (ref 2.0–3.0)

## 2020-07-10 NOTE — Progress Notes (Signed)
Office Note     CC:  follow up Requesting Provider:  Leone Haven, MD  HPI: Jeremy [name] is a 65 y.o. ([birth year]) male who presents for evaluation of nonhealing right below the knee amputation.  This was performed by Dr. Carlis Abbott on 04/14/2020.  He was recently admitted to [facility] and underwent washout of septic right knee joint.  Patient states his knee feels much better since washout.  He and his wife continue to care for his open right below the knee amputation site with daily cleansing and dressing changes.  He is also receiving IV antibiotics per recommendation from infectious disease at [facility].  Infectious disease also recommended right above-the-knee amputation however patient is still adamantly against this.  Surgical history also significant for repair of left groin pseudoaneurysm with subsequent Viabahn stenting of SFA, profunda, and common femoral artery by Dr. Lucky Cowboy.  He is having of L GT nail changes however denies pain.  He is on coumadin for mechanical aortic valve.   Past Medical History:  Diagnosis Date  . Abnormal stress test 03/19/2018  . 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 Aortic stenosis    a. 2014 s/p mech AVR ([facility])-->chronic coumadin; b. 08/2018 Echo: Mild AS/AI. Nl fxn/gradient.  . Arthritis   . Atrial fibrillation ([place])   . CAD (coronary artery disease)    a. 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 02/2018 MV [facility]): large inf, infsept, inflat infarct w/ min rev, EF 30%; c. 06/2018 Cath: LM min irregs, LAD 60p, 95/30/5m, LCX min irregs, RCA 40p, 39m, 90d, RPAV 90, RPDA 95ost; d. 09/2018 CABGx3 (LIMA->LAD, VG->D1, VG->PDA).  . Chronic anticoagulation 06/2012   a. Coumadin in the setting of mech AVR and afib.  Marland Kitchen COVID-19 03/15/2019  . CPAP (continuous positive airway pressure) dependence   . Dialysis patient [facility]) 04/07/2006   Patient started HD around 1996,  had 1st transplant  LLQ around 1998 until 2007 when they found renal cell cancer in transplant and both native kidneys, all were removed > went back on HD until 2nd transplant RLQ in 2014 which lasted 3 yrs; it was embolized for suspected acute rejection. Is currently on HD MWF in [place] w/ [facility] nephrology.  . ESRD (end stage renal disease) ([place])    a. CKD 2/2 to IgA nephropathy (dx age 31); b. 1999 s/p Kidney transplant; c. 2014 Bilat nephrectomy (transplanted kidney resected) in the setting of renal cell carcinoma; d. PD until 11/2017-->HD.  Marland Kitchen Heart murmur   . HFrEF (heart failure with reduced ejection fraction) ([place])    a. 07/2016 Echo: EF 55-60%; b. 02/2018 MV: EF 30%; c. 04/2018 Echo: EF 35%; d. 08/2018 Echo: EF 30-35%. Glob HK w/o rwma. Mildly reduced RV fxn. Mod to sev dil LA. Nl AoV fxn.  Marland Kitchen Hx of colonoscopy    2009 by Dr. Laural Golden. Normal except for small submucosal lipoma. No evidence of polyps or colitis.  . Hyperlipidemia   . Hypertension   . Hypogonadism in male 07/25/2015  . Ischemic cardiomyopathy    a. 07/2016 Echo: EF 55-60%; b. 02/2018 MV: EF 30%; c. 04/2018 Echo: EF 35%; d. 08/2018 Echo: EF 30-35%.  . OSA (obstructive sleep apnea)    No CPAP  . Permanent atrial fibrillation (H[facility])    a. CHA2DS2VASc = 3-->Coumadin (also mech AVR); b. 06/2018 Amio started 2/2 elevated rates.  . Renal cell cancer ([place])   .  Renal cell carcinoma ([place]) 08/04/2016    Past Surgical History:  Procedure Laterality Date  . ABDOMINAL AORTOGRAM W/LOWER EXTREMITY N/A 12/22/2019   Procedure: ABDOMINAL AORTOGRAM W/LOWER EXTREMITY;  Surgeon: Marty Heck, MD;  Location: [place] CV [facility];  Service: Cardiovascular;  Laterality: N/A;  . AMPUTATION Right 04/14/2020   Procedure: RIGHT BELOW KNEE AMPUTATION;  Surgeon: Marty Heck, MD;  Location: [place];  Service: Vascular;  Laterality: Right;  . AORTIC VALVE REPLACEMENT  3 /11 /2014   At [place]    . CAPD INSERTION N/A  02/19/2017   Procedure: LAPAROSCOPIC INSERTION CONTINUOUS AMBULATORY PERITONEAL DIALYSIS  (CAPD) CATHETER;  Surgeon: Algernon Huxley, MD;  Location: [facility];  Service: General;  Laterality: N/A;  . CORONARY ANGIOPLASTY WITH STENT PLACEMENT    . CORONARY ARTERY BYPASS GRAFT N/A 09/16/2018   Procedure: CORONARY ARTERY BYPASS GRAFT times three      with left internal mammary artery and endoharvest of right leg greater saphenous vein;  Surgeon: Melrose Nakayama, MD;  Location: [place];  Service: Open Heart Surgery;  Laterality: N/A;  . CORONARY STENT INTERVENTION N/A 07/15/2019   Procedure: CORONARY STENT INTERVENTION;  Surgeon: Wellington Hampshire, MD;  Location: [place] CV [facility];  Service: Cardiovascular;  Laterality: N/A;  . DG AV DIALYSIS  SHUNT ACCESS EXIST*L* OR     left arm  . EPIGASTRIC HERNIA REPAIR N/A 02/19/2017   Procedure: HERNIA REPAIR EPIGASTRIC ADULT;  Surgeon: Robert Bellow, MD;  Location: [facility];  Service: General;  Laterality: N/A;  . EYE SURGERY     bilateral cataract  . HERNIA REPAIR     UMBILICAL HERNIA  . HERNIA REPAIR  02/19/2017  . INNER EAR SURGERY     20 years ago  . KIDNEY TRANSPLANT    . KNEE ARTHROSCOPY WITH MEDIAL MENISECTOMY Right 07/02/2020   Procedure: KNEE ARTHROSCOPY WITH INCISION AND DRAINAGE;  Surgeon: Leim Fabry, MD;  Location: [facility];  Service: Orthopedics;  Laterality: Right;  . LEFT HEART CATH AND CORS/GRAFTS ANGIOGRAPHY N/A 07/15/2019   Procedure: LEFT HEART CATH AND CORS/GRAFTS ANGIOGRAPHY;  Surgeon: Wellington Hampshire, MD;  Location: [place] CV LAB;  Service: Cardiovascular;  Laterality: N/A;  . LOWER EXTREMITY ANGIOGRAPHY Right 08/25/2019   Procedure: LOWER EXTREMITY ANGIOGRAPHY;  Surgeon: Algernon Huxley, MD;  Location: [place] CV LAB;  Service: Cardiovascular;  Laterality: Right;  . LOWER EXTREMITY ANGIOGRAPHY Left 09/29/2019   Procedure: Lower Extremity Angiography;  Surgeon: Algernon Huxley, MD;  Location: [place] CV LAB;  Service:  Cardiovascular;  Laterality: Left;  . PERIPHERAL VASCULAR INTERVENTION Right 12/22/2019   Procedure: PERIPHERAL VASCULAR INTERVENTION;  Surgeon: Marty Heck, MD;  Location: [place] CV [facility];  Service: Cardiovascular;  Laterality: Right;  . Peritoneal Dialysis access placement  02/19/2017  . PSEUDOANERYSM COMPRESSION N/A 09/29/2019   Procedure: PSEUDOANERYSM COMPRESSION;  Surgeon: Algernon Huxley, MD;  Location: [place] [facility];  Service: Cardiovascular;  Laterality: N/A;  . REMOVAL OF A DIALYSIS CATHETER N/A 11/23/2017   Procedure: REMOVAL OF A PERITONEAL DIALYSIS CATHETER;  Surgeon: Algernon Huxley, MD;  Location: [facility];  Service: Vascular;  Laterality: N/A;  . RIGHT/LEFT HEART CATH AND CORONARY ANGIOGRAPHY Bilateral 06/07/2018   Procedure: RIGHT/LEFT HEART CATH AND CORONARY ANGIOGRAPHY;  Surgeon: Wellington Hampshire, MD;  Location: [place] CV [facility];  Service: Cardiovascular;  Laterality: Bilateral;  . TEE WITHOUT CARDIOVERSION N/A 07/21/2016   Procedure: TRANSESOPHAGEAL  ECHOCARDIOGRAM (TEE);  Surgeon: Wellington Hampshire, MD;  Location: [facility];  Service: Cardiovascular;  Laterality: N/A;  . TEE WITHOUT CARDIOVERSION N/A 09/16/2018   Procedure: TRANSESOPHAGEAL ECHOCARDIOGRAM (TEE);  Surgeon: Melrose Nakayama, MD;  Location: [place];  Service: Open Heart Surgery;  Laterality: N/A;    Social History   Socioeconomic History  . Marital status: Divorced    Spouse name: Not on file  . Number of children: 1  . Years of education: 90  . Highest education level: High school graduate  Occupational History  . Occupation: retired Forensic scientist -Disabled    Comment: Psychologist, educational  Tobacco Use  . Smoking status: Never Smoker  . Smokeless tobacco: Never Used  Vaping Use  . Vaping Use: Never used  Substance and Sexual Activity  . Alcohol use: Not Currently  . Drug use: No  . Sexual activity: Not Currently    Partners: Female  Other Topics Concern  . Not on file  Social History Narrative    Lives at home with girlfriend   1 daughter 50    Right handed    Social Determinants of Health   Financial Resource Strain: Not on file  Food Insecurity: Not on file  Transportation Needs: Not on file  Physical Activity: Not on file  Stress: Not on file  Social Connections: Not on file  Intimate Partner Violence: Not on file    Family History  Problem Relation Age of Onset  . Cancer Mother        pancreatic  . Heart disease Father   . Diabetes Father     Current Outpatient Medications  Medication Sig Dispense Refill  . acetaminophen (TYLENOL) 500 MG tablet Take 1 tablet (500 mg total) by mouth every 4 (four) hours as needed. 30 tablet 0  . allopurinol (ZYLOPRIM) 100 MG tablet Take 1 tablet (100 mg total) by mouth daily. 30 tablet 0  . cefTAZidime 1 g in dextrose 5 % 50 mL Inject 1 g into the vein every Monday, Wednesday, and Friday with hemodialysis for 20 days.    . CHOLECALCIFEROL PO Take by mouth.    . clopidogrel (PLAVIX) 75 MG tablet Take 1 tablet (75 mg total) by mouth daily with breakfast. 90 tablet 3  . ezetimibe (ZETIA) 10 MG tablet TAKE 1 TABLET BY MOUTH EVERY DAY (Patient taking differently: Take 10 mg by mouth daily.) 90 tablet 1  . hydroxychloroquine (PLAQUENIL) 200 MG tablet Take 400 mg by mouth at bedtime.    . lidocaine-prilocaine (EMLA) cream Apply 1 application topically daily as needed (for fistula).    . loperamide (IMODIUM A-D) 2 MG tablet Take 2 mg by mouth 4 (four) times daily as needed for diarrhea or loose stools.    . megestrol (MEGACE) 40 MG/ML suspension TAKE 10 ML (400 MG TOTAL) BY MOUTH DAILY.    . metoprolol tartrate (LOPRESSOR) 100 MG tablet Take 1 tablet (100 mg total) by mouth 2 (two) times daily. 60 tablet 0  . midodrine (PROAMATINE) 10 MG tablet TAKE 1 TABLET BY MOUTH 3 TIMES A WEEK AS DIRECTED TAKE 1 TAB BEFORE DIALYSIS MAY REPEAT ONCE AS NEED    . multivitamin (RENA-VIT) TABS tablet Take 1 tablet by mouth daily.    Marland Kitchen  oxyCODONE-acetaminophen (PERCOCET/ROXICET) 5-325 MG tablet Take 1 tablet by mouth every 4 (four) hours as needed.    . pantoprazole (PROTONIX) 40 MG tablet Take 1 tablet (40 mg total) by mouth daily. 90 tablet 3  . polyethylene glycol (MIRALAX /  GLYCOLAX) 17 g packet Take 17 g by mouth daily as needed for mild constipation. 30 each 0  . rosuvastatin (CRESTOR) 40 MG tablet Take 1 tablet (40 mg total) by mouth daily at 6 PM. 90 tablet 3  . sevelamer carbonate (RENVELA) 800 MG tablet Take 3,200 mg by mouth 3 (three) times daily with meals.    . traZODone (DESYREL) 50 MG tablet TAKE 1/2 -1 TABLET (25-50 MG TOTAL) BY MOUTH AT BEDTIME AS NEEDED FOR SLEEP. (Patient taking differently: Take 25-50 mg by mouth at bedtime.) 90 tablet 2  . Vancomycin (VANCOCIN) 750-5 MG/150ML-% SOLN Inject 150 mLs (750 mg total) into the vein every Monday, Wednesday, and Friday with hemodialysis for 20 days. 4000 mL   . warfarin (COUMADIN) 1 MG tablet Take 1 tablet (1 mg total) by mouth daily.    . nitroGLYCERIN (NITROSTAT) 0.4 MG SL tablet Place 1 tablet (0.4 mg total) under the tongue every 5 (five) minutes as needed for chest pain. 25 tablet 3   No current facility-administered medications for this visit.    Allergies  Allergen Reactions  . Statins Other (See Comments)    Arthralgias (intolerance), Myalgias (intolerance)  Pt taking pravastatin   . Gabapentin     Drowsy   . Sertraline Rash     REVIEW OF SYSTEMS:   [X]  denotes positive finding, [ ]  denotes negative finding Cardiac  Comments:  Chest pain or chest pressure:    Shortness of breath upon exertion:    Short of breath when lying flat:    Irregular heart rhythm:        Vascular    Pain in calf, thigh, or hip brought on by ambulation:    Pain in feet at night that wakes you up from your sleep:     Blood clot in your veins:    Leg swelling:         Pulmonary    Oxygen at home:    Productive cough:     Wheezing:         Neurologic    Sudden  weakness in arms or legs:     Sudden numbness in arms or legs:     Sudden onset of difficulty speaking or slurred speech:    Temporary loss of vision in one eye:     Problems with dizziness:         Gastrointestinal    Blood in stool:     Vomited blood:         Genitourinary    Burning when urinating:     Blood in urine:        Psychiatric    Major depression:         Hematologic    Bleeding problems:    Problems with blood clotting too easily:        Skin    Rashes or ulcers:        Constitutional    Fever or chills:      PHYSICAL EXAMINATION:  Vitals:   07/10/20 1117  BP: 116/71  Pulse: (!) 103  Resp: 20  Temp: 98.2 F (36.8 C)  TempSrc: Temporal  SpO2: 100%  Weight: 157 lb 10.1 oz (71.5 kg)  Height: 5\' 10"  (1.778 m)    General:  WDWN in NAD Gait: Not observed HENT: WNL, normocephalic Pulmonary: normal non-labored breathing Cardiac: regular HR Abdomen: soft, NT, no masses Skin: without rashes Vascular Exam/Pulses:  Right Left  Radial 2+ (normal) 2+ (normal)  DP  BKA 1+ (weak) ATA  PT absent absent   Extremities: Large eschar right BKA stump; erythematous left great toe with toenail changes no purulence Musculoskeletal: no muscle wasting or atrophy  Neurologic: A&O X 3;  No focal weakness or paresthesias are detected Psychiatric:  The pt has Normal affect.   ASSESSMENT/PLAN:: 65 y.o. male here for evaluation of nonhealing right below the knee amputation  -Patient developed a septic right knee joint since last office visit which was washed out by the orthopedic group at [facility].  He continues to take IV antibiotics during dialysis treatments based on the recommendations from infectious disease.  It is unclear if there is any communication between his nonhealing right BKA and septic joint however it is certainly plausible -Dr. Carlis Abbott was also involved in the evaluation and management of this patient during office visit today.  Plan is to proceed  with right BKA debridement and likely wound vac placement on Friday, 07/13/2020.  It was also discussed that this would be the final attempt to salvage the right knee.  If Mr. Tulloch is unable to heal this attempt, he will require an above-the-knee amputation.  Patient and his wife are agreeable. -Continue Coumadin, and Plavix perioperatively -We will try to arrange a home health RN for wound VAC changes 3 days a week in an attempt to make this an outpatient surgery -It should also be noted that patient has noticed redness and nail changes of L GT.  On physical exam L foot is well perfused.  Arterial duplex of LLE on 3/3 demonstrated a patent CFA, profunda, and SFA viabahn stents placed by Dr. Lucky Cowboy.  We will continue to monitor    Dagoberto Ligas, PA-C [facility] [phone]  Clinic MD:   Carlis Abbott

## 2020-07-10 NOTE — Telephone Encounter (Signed)
Transition Care Management Follow-up Telephone Call  Date of discharge and from where: [facility] on 07/08/20  How have you been since you were released from the hospital? Doing ok but still having pain in the right knee. Current pain score is a 1. Pt is taking Tylenol as needed for pain. Pt states his knee is still swollen but was told that the swelling could last a week. Pt states he slept well last night and his appetite is good. Pt states his mobility is not good due to weakness from being in the hospital. Pt is mostly using a wheelchair to get around and occasionally a walker. Pt declines pain elsewhere, fever, heat at the site, drainage or bleeding from the wound or n/v/d.  Any questions or concerns? No   Items Reviewed:  Did the pt receive and understand the discharge instructions provided? No   Medications obtained and verified? Declined verifying meds over the phone.  Any new allergies since your discharge? No   Dietary orders reviewed? Yes  Do you have support at home? Yes   Other (ie: DME, Home Health, etc): N/A  Functional Questionnaire: (I = Independent and D = Dependent)  Bathing/Dressing- Has assistance from wife.   Meal Prep- I  Eating- I  Maintaining continence- I  Transferring/Ambulation- D, using a wheelchair and walker currently.   Managing Meds- I   Follow up appointments reviewed:    PCP Hospital f/u appt confirmed? No, declined scheduling a HFU with PCP.    [facility] f/u appt confirmed? Yes    Are transportation arrangements needed? No   If their condition worsens, is the pt aware to call  their PCP or go to the ED? Yes  Was the patient provided with contact information for the PCP's office or ED? Yes  Was the pt encouraged to call back with questions or concerns? Yes

## 2020-07-10 NOTE — Telephone Encounter (Signed)
Scheduled

## 2020-07-11 DIAGNOSIS — D509 Iron deficiency anemia, unspecified: Secondary | ICD-10-CM | POA: Diagnosis not present

## 2020-07-11 DIAGNOSIS — N2581 Secondary hyperparathyroidism of renal origin: Secondary | ICD-10-CM | POA: Diagnosis not present

## 2020-07-11 DIAGNOSIS — Z992 Dependence on renal dialysis: Secondary | ICD-10-CM | POA: Diagnosis not present

## 2020-07-11 DIAGNOSIS — D631 Anemia in chronic kidney disease: Secondary | ICD-10-CM | POA: Diagnosis not present

## 2020-07-11 DIAGNOSIS — N186 End stage renal disease: Secondary | ICD-10-CM | POA: Diagnosis not present

## 2020-07-11 DIAGNOSIS — A499 Bacterial infection, unspecified: Secondary | ICD-10-CM | POA: Diagnosis not present

## 2020-07-11 NOTE — Telephone Encounter (Signed)
Noted  

## 2020-07-12 ENCOUNTER — Encounter (HOSPITAL_COMMUNITY): Payer: Self-pay | Admitting: Vascular Surgery

## 2020-07-12 ENCOUNTER — Encounter: Payer: Self-pay | Admitting: Family

## 2020-07-12 ENCOUNTER — Other Ambulatory Visit: Payer: Self-pay

## 2020-07-12 ENCOUNTER — Other Ambulatory Visit
Admission: RE | Admit: 2020-07-12 | Discharge: 2020-07-12 | Disposition: A | Discharge status: Home / Self Care | Payer: Medicare Other | Source: Ambulatory Visit | Attending: Family | Admitting: Family

## 2020-07-12 ENCOUNTER — Ambulatory Visit (INDEPENDENT_AMBULATORY_CARE_PROVIDER_SITE_OTHER): Payer: Medicare Other | Admitting: Family

## 2020-07-12 ENCOUNTER — Ambulatory Visit (INDEPENDENT_AMBULATORY_CARE_PROVIDER_SITE_OTHER): Payer: Medicare Other | Admitting: Internal Medicine

## 2020-07-12 VITALS — BP 110/60 | HR 110 | Ht 70.0 in | Wt 163.3 lb

## 2020-07-12 DIAGNOSIS — I4891 Unspecified atrial fibrillation: Secondary | ICD-10-CM

## 2020-07-12 DIAGNOSIS — I25118 Atherosclerotic heart disease of native coronary artery with other forms of angina pectoris: Secondary | ICD-10-CM | POA: Insufficient documentation

## 2020-07-12 DIAGNOSIS — I739 Peripheral vascular disease, unspecified: Secondary | ICD-10-CM

## 2020-07-12 DIAGNOSIS — Z952 Presence of prosthetic heart valve: Secondary | ICD-10-CM | POA: Insufficient documentation

## 2020-07-12 DIAGNOSIS — Z79899 Other long term (current) drug therapy: Secondary | ICD-10-CM | POA: Insufficient documentation

## 2020-07-12 DIAGNOSIS — E782 Mixed hyperlipidemia: Secondary | ICD-10-CM

## 2020-07-12 DIAGNOSIS — Z7901 Long term (current) use of anticoagulants: Secondary | ICD-10-CM

## 2020-07-12 DIAGNOSIS — I482 Chronic atrial fibrillation, unspecified: Secondary | ICD-10-CM | POA: Diagnosis not present

## 2020-07-12 LAB — BASIC METABOLIC PANEL
Anion gap: 13 (ref 5–15)
BUN: 28 mg/dL — ABNORMAL HIGH (ref 8–23)
CO2: 30 mmol/L (ref 22–32)
Calcium: 9 mg/dL (ref 8.9–10.3)
Chloride: 95 mmol/L — ABNORMAL LOW (ref 98–111)
Creatinine, Ser: 5.21 mg/dL — ABNORMAL HIGH (ref 0.61–1.24)
GFR, Estimated: 12 mL/min — ABNORMAL LOW (ref >60)
Glucose, Bld: 103 mg/dL — ABNORMAL HIGH (ref 70–99)
Potassium: 4.2 mmol/L (ref 3.5–5.1)
Sodium: 138 mmol/L (ref 135–145)

## 2020-07-12 LAB — MAGNESIUM: Magnesium: 2.5 mg/dL — ABNORMAL HIGH (ref 1.7–2.4)

## 2020-07-12 NOTE — Progress Notes (Signed)
Jeremy [name] denies chest pain or shortness of breath. Patient was tested for Covid and has been in quarantine since that time.  Jeremy [name] was instructed to continue Plavix and Coumadin.  Jeremy [name] will have dialysis on Saturday this week.

## 2020-07-12 NOTE — Progress Notes (Signed)
Office Visit    Patient Name: Jeremy [name] Date of Encounter: 07/12/2020  PCP:  Leone Haven, [facility]  Cardiologist:  Kathlyn Sacramento, MD  Advanced Practice Provider:  No care team member to display Electrophysiologist:  None   Chief Complaint    Jeremy [name] is a 65 y.o. 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 Abnormal stress test 03/19/2018  . 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 Anxiety    situation  . Aortic stenosis    a. 2014 s/p mech AVR ([facility])-->chronic coumadin; b. 08/2018 Echo: Mild AS/AI. Nl fxn/gradient.  . Arthritis   . Atrial fibrillation ([place])   . CAD (coronary artery disease)    a. 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 02/2018 MV [facility]): large inf, infsept, inflat infarct w/ min rev, EF 30%; c. 06/2018 Cath: LM min irregs, LAD 60p, 95/30/10m, LCX min irregs, RCA 40p, 66m, 90d, RPAV 90, RPDA 95ost; d. 09/2018 CABGx3 (LIMA->LAD, VG->D1, VG->PDA).  . Chronic anticoagulation 06/2012   a. Coumadin in the setting of mech AVR and afib.  . Complication of anesthesia   . COVID-19 03/15/2019  . CPAP (continuous positive airway pressure) dependence   . Dialysis patient [facility]) 04/07/2006   Patient started HD around 1996,  had 1st transplant LLQ around 1998 until 2007 when they found renal cell cancer in transplant and both native kidneys, all were removed > went back on HD until 2nd transplant RLQ in 2014 which lasted 3 yrs; it was embolized for suspected acute rejection. Is currently on HD MWF in  [place] w/ [facility] nephrology.  . ESRD (end stage renal disease) ([place])    a. 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 1999 s/p Kidney transplant; c. 2014 Bilat nephrectomy (transplanted kidney resected) in the setting of renal cell carcinoma; d. PD until 11/2017-->HD.  Marland Kitchen Heart murmur   . HFrEF (heart failure with reduced ejection fraction) ([place])    a. 07/2016 Echo: EF 55-60%; b. 02/2018 MV: EF 30%; c. 04/2018 Echo: EF 35%; d. 08/2018 Echo: EF 30-35%. Glob HK w/o rwma. Mildly reduced RV fxn. Mod to sev dil LA. Nl AoV fxn.  Marland Kitchen History of blood transfusion   . Hx of colonoscopy    2009 by Dr. Laural Golden. Normal except for small submucosal lipoma. No evidence of polyps or colitis.  . Hyperlipidemia   . Hypertension   . Hypogonadism in male 07/25/2015  . Ischemic cardiomyopathy    a. 07/2016 Echo: EF 55-60%; b. 02/2018 MV: EF 30%; c. 04/2018 Echo: EF 35%; d. 08/2018 Echo: EF 30-35%.  . Myocardial infarction ([place])   . OSA (obstructive sleep apnea)    No CPAP  . Peripheral vascular disease ([place])   . Permanent atrial fibrillation ([facility])    a. CHA2DS2VASc = 3-->Coumadin (also mech AVR); b. 06/2018 Amio started 2/2 elevated rates.  Marland Kitchen PONV (postoperative nausea and vomiting)   . Renal cell cancer ([place])   . Renal cell carcinoma ([place]) 08/04/2016   Past Surgical  History:  Procedure Laterality Date  . ABDOMINAL AORTOGRAM W/LOWER EXTREMITY N/A 12/22/2019   Procedure: ABDOMINAL AORTOGRAM W/LOWER EXTREMITY;  Surgeon: Marty Heck, MD;  Location: [place] [facility];  Service: Cardiovascular;  Laterality: N/A;  . AMPUTATION Right 04/14/2020   Procedure: RIGHT BELOW KNEE AMPUTATION;  Surgeon: Marty Heck, MD;  Location: [place];  Service: Vascular;  Laterality: Right;  . AORTIC VALVE REPLACEMENT  3 /11 /2014   At [place]    . CAPD INSERTION N/A 02/19/2017   Procedure: LAPAROSCOPIC INSERTION CONTINUOUS AMBULATORY PERITONEAL DIALYSIS  (CAPD) CATHETER;   Surgeon: Algernon Huxley, MD;  Location: [facility];  Service: General;  Laterality: N/A;  . CORONARY ANGIOPLASTY WITH STENT PLACEMENT    . CORONARY ARTERY BYPASS GRAFT N/A 09/16/2018   Procedure: CORONARY ARTERY BYPASS GRAFT times three      with left internal mammary artery and endoharvest of right leg greater saphenous vein;  Surgeon: Melrose Nakayama, MD;  Location: [place];  Service: Open Heart Surgery;  Laterality: N/A;  . CORONARY STENT INTERVENTION N/A 07/15/2019   Procedure: CORONARY STENT INTERVENTION;  Surgeon: Wellington Hampshire, MD;  Location: [place] [facility];  Service: Cardiovascular;  Laterality: N/A;  . DG AV DIALYSIS  SHUNT ACCESS EXIST*L* OR     left arm  . EPIGASTRIC HERNIA REPAIR N/A 02/19/2017   Procedure: HERNIA REPAIR EPIGASTRIC ADULT;  Surgeon: Robert Bellow, MD;  Location: [facility];  Service: General;  Laterality: N/A;  . EYE SURGERY     bilateral cataract  . HERNIA REPAIR     UMBILICAL HERNIA  . HERNIA REPAIR  02/19/2017  . INNER EAR SURGERY     20 years ago  . KIDNEY TRANSPLANT    . KNEE ARTHROSCOPY WITH MEDIAL MENISECTOMY Right 07/02/2020   Procedure: KNEE ARTHROSCOPY WITH INCISION AND DRAINAGE;  Surgeon: Leim Fabry, MD;  Location: [facility];  Service: Orthopedics;  Laterality: Right;  . LEFT HEART CATH AND CORS/GRAFTS ANGIOGRAPHY N/A 07/15/2019   Procedure: LEFT HEART CATH AND CORS/GRAFTS ANGIOGRAPHY;  Surgeon: Wellington Hampshire, MD;  Location: [place] [facility];  Service: Cardiovascular;  Laterality: N/A;  . LOWER EXTREMITY ANGIOGRAPHY Right 08/25/2019   Procedure: LOWER EXTREMITY ANGIOGRAPHY;  Surgeon: Algernon Huxley, MD;  Location: [place] [facility];  Service: Cardiovascular;  Laterality: Right;  . LOWER EXTREMITY ANGIOGRAPHY Left 09/29/2019   Procedure: Lower Extremity Angiography;  Surgeon: Algernon Huxley, MD;  Location: [place] [facility];  Service: Cardiovascular;  Laterality: Left;  . PERIPHERAL VASCULAR INTERVENTION Right 12/22/2019   Procedure:  PERIPHERAL VASCULAR INTERVENTION;  Surgeon: Marty Heck, MD;  Location: [place] [facility];  Service: Cardiovascular;  Laterality: Right;  . Peritoneal Dialysis access placement  02/19/2017  . PSEUDOANERYSM COMPRESSION N/A 09/29/2019   Procedure: PSEUDOANERYSM COMPRESSION;  Surgeon: Algernon Huxley, MD;  Location: [place] [facility];  Service: Cardiovascular;  Laterality: N/A;  . REMOVAL OF A DIALYSIS CATHETER N/A 11/23/2017   Procedure: REMOVAL OF A PERITONEAL DIALYSIS CATHETER;  Surgeon: Algernon Huxley, MD;  Location: [facility];  Service: Vascular;  Laterality: N/A;  . RIGHT/LEFT HEART CATH AND CORONARY ANGIOGRAPHY Bilateral 06/07/2018   Procedure: RIGHT/LEFT HEART CATH AND CORONARY ANGIOGRAPHY;  Surgeon: Wellington Hampshire, MD;  Location: [place] [facility];  Service: Cardiovascular;  Laterality: Bilateral;  . TEE WITHOUT CARDIOVERSION N/A 07/21/2016   Procedure: TRANSESOPHAGEAL ECHOCARDIOGRAM (TEE);  Surgeon: Wellington Hampshire, MD;  Location:  [facility];  Service: Cardiovascular;  Laterality: N/A;  . TEE WITHOUT CARDIOVERSION N/A 09/16/2018   Procedure: TRANSESOPHAGEAL ECHOCARDIOGRAM (TEE);  Surgeon: Melrose Nakayama, MD;  Location: [place];  Service: Open Heart Surgery;  Laterality: N/A;    Allergies  Allergies  Allergen Reactions  . Statins Other (See Comments)    Arthralgias (intolerance), Myalgias (intolerance)  Pt taking pravastatin   . Gabapentin     Drowsy   . Sertraline Rash    History of Present Illness    Jeremy [name] is a 65 y.o. 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 [name].  He was transitioned from peritoneal dialysis to hemodialysis August 2019 due to ineffective fluid  removal.  He had worsening angina in early 2020 with abnormal nuclear stress test.  Echocardiogram January 2020 with drop in EF from normal to 35%, moderately reduced RV function, moderate biatrial enlargement, CAP mechanical aortic valve with mean gradient 21 mmHg, mild MR, mild pulmonary hypertension.  Cardiac catheterization showing severe heavily calcified two-vessel CAD affecting LAD and RCA.  Underwent CABG with LIMA to LAD, SVG to diagonal, SVG to right PDA.  Improvement in symptoms and then started having issues with atrial fibrillation with RVR.  He was on amiodarone for rate control which was discontinued given chronicity and improvement in ventricular rate with metoprolol.  He was switched to carvedilol but ventricular rate worsened.  Nuclear stress test 07/2018 large fixed defect in inferior inferolateral wall consistent with infarct as well as inferior ischemia and EF 27%.  Cardiac catheterization 07/2018 significant underlying three-vessel coronary disease with patent LIMA to LAD and occluded SVG to diagonal.  Diagonal was getting collaterals from LAD.  SVG to right PDA was patent but had ostial 99% stenosis.  Underwent angioplasty and DES to SVG to RPDA.  Small hematoma after the procedure that did not require intervention.  Corrected to metoprolol for better rate control.  Discharged on Plavix without aspirin given anticoagulation with warfarin.  He follows closely with Dr. Lucky Cowboy of vascular.  Surgical history notable for repair of left groin pseudoaneurysm with subsequent Viabahn stenting of SFA, profunda, common femoral artery.  Underwent right below the knee amputation 04/14/2020.  Hospitalized 07/02/2020-07/08/2020.  He presented with fever, knee pain, swelling.  Found of severe sepsis secondary to right knee septic arthritis.  Treated with empiric antibiotics.  Underwent right knee arthroscopic I&D, right knee partial lateral meniscectomy, right knee synovectomy 07/02/2020.  ID was consulted.  He  was discharged with IV antibiotics to be given at hemodialysis.  He was seen and followed by vascular 07/10/2020 and recommended for right BKA debridement and and likely wound VAC placement 07/13/2020.  He presents today for follow-up.  He is doing overall well from a cardiac perspective with no chest pain nor worsening dyspnea on exertion.  He reports no palpitations and tells me his heart rate when checked at dialysis has been doing overall well.  Reports HD has been going well with only very rare episodes of hypotension. Denies lightheadedness, dizziness, near syncope, syncope. Reports no edema nor orthopnea, sleeps with one pillow. Tells me he will occasionally wake feeling short of breath. He will get occasional sensation of muscle spasm and wonders if his electrolytes  are abnormal. No change in sensation and no noted aggravating nor relieving factors.   EKGs/Labs/Other Studies Reviewed:   The following studies were reviewed today:  LHC 07/15/2019  Prox LAD lesion is 90% stenosed.  Mid LAD-1 lesion is 95% stenosed.  Mid LAD-2 lesion is 30% stenosed.  Mid LAD to Dist LAD lesion is 60% stenosed.  Prox RCA lesion is 40% stenosed.  Mid RCA lesion is 70% stenosed.  Dist RCA lesion is 99% stenosed with 99% stenosed side branch in RPAV.  Ost RPDA to RPDA lesion is 95% stenosed.  SVG.  Origin lesion is 100% stenosed.  LIMA graft was visualized by angiography.  The graft exhibits no disease.  Origin to Prox Graft lesion is 99% stenosed.  Post intervention, there is a 0% residual stenosis.  A drug-eluting stent was successfully placed using a STENT RESOLUTE ONYX 4.5X15.   1.  Severe underlying two-vessel coronary artery disease including the LAD and right coronary artery.  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  Patent LIMA to LAD and occluded SVG to diagonal.  The diagonal receives collaterals from the distal LAD.  Patent SVG to right  PDA with 99% ostial stenosis. 2.  Successful angioplasty and drug-eluting stent placement to SVG to right PDA. 3.  Small hematoma in the right groin noted during the diagnostic portion of the cath with suspecting oozing around the sheath due to heavy calcifications in the common femoral artery.  This improved with upsizing the sheath and manual pressure.  This was stable at the end of the case after deploying a minx closure device and holding additional 10 minutes of pressure.   Recommendations: Resume heparin drip 8 hours after sheath pull after making sure that the right groin hematoma is stable. Resume warfarin tonight. The patient was started on clopidogrel and he is currently on low-dose aspirin.  I do not think he will tolerate triple therapy.  I recommend stopping aspirin before hospital discharge. For atrial fibrillation, I switch carvedilol to metoprolol tartrate 25 mg twice daily.   Echo 10/01/2019 1. Left ventricular ejection fraction, by estimation, is 45 to 50%. The  left ventricle has mildly decreased function. The left ventricle  demonstrates regional wall motion abnormalities (see scoring  diagram/findings for description). There is moderate  left ventricular hypertrophy. Left ventricular diastolic parameters are  indeterminate. There is moderate hypokinesis of the left ventricular,  basal-mid inferior wall and inferolateral wall.   2. Right ventricular systolic function is normal. The right ventricular  size is normal. Tricuspid regurgitation signal is inadequate for assessing  PA pressure.   3. Left atrial size was moderately dilated.   4. Right atrial size was mildly dilated.   5. The mitral valve is normal in structure. No evidence of mitral valve  regurgitation. No evidence of mitral stenosis.   6. The aortic valve has been repaired/replaced. Aortic valve  regurgitation is not visualized. No aortic stenosis is present. There is a  St. Jude valve present in the aortic  position. Aortic valve mean gradient  measures 11.0 mmHg.   EKG:  EKG is ordered today.  The ekg ordered today demonstrates atrial flutter with variable AV block and known RBBB 110 bpm with no acute ST-T wave changes.  Recent Labs: 07/03/2020: ALT 14 07/07/2020: Hemoglobin 8.3; Platelets 107 07/12/2020: BUN 28; Creatinine, Ser 5.21; Magnesium 2.5; Potassium 4.2; Sodium 138  Recent Lipid Panel    Component Value Date/Time   CHOL 136 06/21/2019 0932  CHOL 179 04/26/2019 0938   TRIG 150 (H) 06/21/2019 0932   HDL 33 (L) 06/21/2019 0932   HDL 38 (L) 04/26/2019 0938   CHOLHDL 4.1 06/21/2019 0932   VLDL 30 06/21/2019 0932   LDLCALC 73 06/21/2019 0932   LDLCALC 111 (H) 04/26/2019 0938   LDLDIRECT 61.0 08/11/2017 1447   Home Medications   Current Meds  Medication Sig  . acetaminophen (TYLENOL) 500 MG tablet Take 1 tablet (500 mg total) by mouth every 4 (four) hours as needed.  Marland Kitchen allopurinol (ZYLOPRIM) 100 MG tablet Take 1 tablet (100 mg total) by mouth daily.  . cefTAZidime 1 g in dextrose 5 % 50 mL Inject 1 g into the vein every Monday, Wednesday, and Friday with hemodialysis for 20 days.  . CHOLECALCIFEROL PO Take by mouth.  . clopidogrel (PLAVIX) 75 MG tablet Take 1 tablet (75 mg total) by mouth daily with breakfast.  . ezetimibe (ZETIA) 10 MG tablet TAKE 1 TABLET BY MOUTH EVERY DAY  . hydroxychloroquine (PLAQUENIL) 200 MG tablet Take 400 mg by mouth at bedtime.  . lidocaine-prilocaine (EMLA) cream Apply 1 application topically daily as needed (for fistula).  . loperamide (IMODIUM A-D) 2 MG tablet Take 2 mg by mouth 4 (four) times daily as needed for diarrhea or loose stools.  . megestrol (MEGACE) 40 MG/ML suspension TAKE 10 ML (400 MG TOTAL) BY MOUTH DAILY.  . metoprolol tartrate (LOPRESSOR) 100 MG tablet Take 1 tablet (100 mg total) by mouth 2 (two) times daily.  . midodrine (PROAMATINE) 10 MG tablet TAKE 1 TABLET BY MOUTH 3 TIMES A WEEK AS DIRECTED TAKE 1 TAB BEFORE DIALYSIS MAY REPEAT  ONCE AS NEED  . multivitamin (RENA-VIT) TABS tablet Take 1 tablet by mouth daily.  . nitroGLYCERIN (NITROSTAT) 0.4 MG SL tablet Place 1 tablet (0.4 mg total) under the tongue every 5 (five) minutes as needed for chest pain.  Marland Kitchen oxyCODONE-acetaminophen (PERCOCET/ROXICET) 5-325 MG tablet Take 1 tablet by mouth every 4 (four) hours as needed.  . pantoprazole (PROTONIX) 40 MG tablet Take 1 tablet (40 mg total) by mouth daily.  . polyethylene glycol (MIRALAX / GLYCOLAX) 17 g packet Take 17 g by mouth daily as needed for mild constipation.  . rosuvastatin (CRESTOR) 40 MG tablet Take 1 tablet (40 mg total) by mouth daily at 6 PM.  . sevelamer carbonate (RENVELA) 800 MG tablet Take 3,200 mg by mouth 3 (three) times daily with meals.  . traZODone (DESYREL) 50 MG tablet TAKE 1/2 -1 TABLET (25-50 MG TOTAL) BY MOUTH AT BEDTIME AS NEEDED FOR SLEEP.  . Vancomycin (VANCOCIN) 750-5 MG/150ML-% SOLN Inject 150 mLs (750 mg total) into the vein every Monday, Wednesday, and Friday with hemodialysis for 20 days.  Marland Kitchen warfarin (COUMADIN) 1 MG tablet Take 1 tablet (1 mg total) by mouth daily.     Review of Systems  All other systems reviewed and are otherwise negative except as noted above.  Physical Exam    VS:  BP 110/60 (BP Location: Left Arm, Patient Position: Sitting, Cuff Size: Normal)   Pulse (!) 110   Ht 5\' 10"  (1.778 m)   Wt 163 lb 5 oz (74.1 kg)   SpO2 98%   BMI 23.43 kg/m  , BMI Body mass index is 23.43 kg/m.  Wt Readings from Last 3 Encounters:  07/12/20 163 lb 5 oz (74.1 kg)  07/10/20 157 lb 10.1 oz (71.5 kg)  07/08/20 163 lb (73.9 kg)    GEN: Well nourished, well developed, in no  acute distress. HEENT: normal. Neck: Supple, no JVD, carotid bruits, or masses. Cardiac: Irregularly irregular, tachycardic, mechanical valve click noted, no  rubs, or gallops. No clubbing, cyanosis, edema.  Radials 2+ and equal bilaterally.  Respiratory:  Respirations regular and unlabored, clear to auscultation  bilaterally. GI: Soft, nontender, nondistended. MS: No deformity or atrophy. RLE BKA with ACE bandage and brace in place. Skin: Warm and dry, no rash. Neuro:  Strength and sensation are intact. Psych: Normal affect.  Assessment & Plan    1. CAD - No anginal symptoms. No indication for ischemic evaluation. GDMT includes Metoprolol, Rosuvastatin, Zetia, Clopidogrel. No aspirin due to chronic anticoagulation. Continue heart healthy diet and regular cardiovascular exercise.   2. HFrEF - Most likely due to ischemic cardiomyopathy.  Most recent echo 09/2019 LVEF 45 to 50%.  Escalation of GDMT limited by hypotension and ESRD.  GDMT includes Lopressor.  Volume management per hemodialysis.  3. Chronic atrial fibrillation/flutter -long-term anticoagulation with warfarin given mechanical heart valve.  Continue metoprolol tartrate 100 mg twice daily.  Reports no symptomatic hypotension.  Heart rate mildly elevated today likely in the setting of infection and stress regarding upcoming procedure tomorrow.  4. Hyperlipidemia -continue high-dose rosuvastatin and Zetia.  5. Mechanical aortic valve replacement -continue long-term anticoagulation with warfarin.  6. ESRD on HD - Continue HD Monday Wednesday Friday and follow-up with nephrology.  7. PAD - Continue to follow with vascular.  Upcoming right BKA debridement and possible wound VAC placement.  Per AHA/ACC guidelines he is deemed acceptable risk for the planned procedure with no need for additional cardiovascular intervention. Due to hx of mechanical AVR would require bridging with Lovenox should need to stop anticoagulation arise. Most recent INR 07/10/20 of 3.3. Management per [facility].   Disposition: Follow up June 2022 with Dr. [name] as scheduled.   Signed, Loel Dubonnet, NP 07/12/2020, 12:28 PM [facility]

## 2020-07-12 NOTE — Patient Instructions (Addendum)
Medication Instructions:  Continue your current medications.  *If you need a refill on your cardiac medications before your next appointment, please call your pharmacy*   Lab Work: Your physician recommends that you return for lab work today: BMP, magnesium  If you have labs (blood work) drawn today and your tests are completely normal, you will receive your results only by: Marland Kitchen MyChart Message (if you have MyChart) OR . A paper copy in the mail If you have any lab test that is abnormal or we need to change your treatment, we will call you to review the results.   Testing/Procedures: Your EKG today showed atrial fibrillation with some mildly fast heart rate.   Follow-Up: At [facility], you and your health needs are our priority.  As part of our continuing mission to provide you with exceptional heart care, we have created designated Provider Care Teams.  These Care Teams include your primary Cardiologist (physician) and Advanced Practice Providers (APPs -  Physician Assistants and Nurse Practitioners) who all work together to provide you with the care you need, when you need it.  We recommend signing up for the patient portal called "MyChart".  Sign up information is provided on this After Visit Summary.  MyChart is used to connect with patients for Virtual Visits (Telemedicine).  Patients are able to view lab/test results, encounter notes, upcoming appointments, etc.  Non-urgent messages can be sent to your provider as well.   To learn more about what you can do with MyChart, go to [email or web address].    Your next appointment:   In June with Dr. [name] as scheduled 09/20/2020 at 11:00 am, arrive 10-15 mins early to your appt for check-in

## 2020-07-13 ENCOUNTER — Encounter (HOSPITAL_COMMUNITY)
Admission: RE | Disposition: A | Discharge status: Home / Self Care | Payer: Self-pay | Source: Ambulatory Visit | Attending: Vascular Surgery

## 2020-07-13 ENCOUNTER — Ambulatory Visit (HOSPITAL_COMMUNITY): Payer: Medicare Other | Admitting: Anesthesiology

## 2020-07-13 ENCOUNTER — Other Ambulatory Visit: Payer: Self-pay

## 2020-07-13 ENCOUNTER — Ambulatory Visit (HOSPITAL_COMMUNITY)
Admission: RE | Admit: 2020-07-13 | Discharge: 2020-07-13 | Disposition: A | Discharge status: Home / Self Care | Payer: Medicare Other | Source: Ambulatory Visit | Attending: Vascular Surgery | Admitting: Vascular Surgery

## 2020-07-13 ENCOUNTER — Encounter (HOSPITAL_COMMUNITY): Payer: Self-pay | Admitting: Vascular Surgery

## 2020-07-13 DIAGNOSIS — N186 End stage renal disease: Secondary | ICD-10-CM | POA: Insufficient documentation

## 2020-07-13 DIAGNOSIS — I4821 Permanent atrial fibrillation: Secondary | ICD-10-CM | POA: Insufficient documentation

## 2020-07-13 DIAGNOSIS — I5020 Unspecified systolic (congestive) heart failure: Secondary | ICD-10-CM | POA: Insufficient documentation

## 2020-07-13 DIAGNOSIS — Z79899 Other long term (current) drug therapy: Secondary | ICD-10-CM | POA: Insufficient documentation

## 2020-07-13 DIAGNOSIS — I2510 Atherosclerotic heart disease of native coronary artery without angina pectoris: Secondary | ICD-10-CM | POA: Diagnosis not present

## 2020-07-13 DIAGNOSIS — Z85528 Personal history of other malignant neoplasm of kidney: Secondary | ICD-10-CM | POA: Diagnosis not present

## 2020-07-13 DIAGNOSIS — I739 Peripheral vascular disease, unspecified: Secondary | ICD-10-CM | POA: Insufficient documentation

## 2020-07-13 DIAGNOSIS — I350 Nonrheumatic aortic (valve) stenosis: Secondary | ICD-10-CM | POA: Insufficient documentation

## 2020-07-13 DIAGNOSIS — Z7901 Long term (current) use of anticoagulants: Secondary | ICD-10-CM | POA: Insufficient documentation

## 2020-07-13 DIAGNOSIS — Z952 Presence of prosthetic heart valve: Secondary | ICD-10-CM | POA: Diagnosis not present

## 2020-07-13 DIAGNOSIS — J449 Chronic obstructive pulmonary disease, unspecified: Secondary | ICD-10-CM | POA: Diagnosis not present

## 2020-07-13 DIAGNOSIS — M069 Rheumatoid arthritis, unspecified: Secondary | ICD-10-CM | POA: Diagnosis not present

## 2020-07-13 DIAGNOSIS — R011 Cardiac murmur, unspecified: Secondary | ICD-10-CM | POA: Diagnosis not present

## 2020-07-13 DIAGNOSIS — Z951 Presence of aortocoronary bypass graft: Secondary | ICD-10-CM | POA: Insufficient documentation

## 2020-07-13 DIAGNOSIS — I4892 Unspecified atrial flutter: Secondary | ICD-10-CM | POA: Diagnosis not present

## 2020-07-13 DIAGNOSIS — M1711 Unilateral primary osteoarthritis, right knee: Secondary | ICD-10-CM | POA: Diagnosis not present

## 2020-07-13 DIAGNOSIS — Z955 Presence of coronary angioplasty implant and graft: Secondary | ICD-10-CM | POA: Insufficient documentation

## 2020-07-13 DIAGNOSIS — I255 Ischemic cardiomyopathy: Secondary | ICD-10-CM | POA: Diagnosis not present

## 2020-07-13 DIAGNOSIS — E291 Testicular hypofunction: Secondary | ICD-10-CM | POA: Diagnosis not present

## 2020-07-13 DIAGNOSIS — Z888 Allergy status to other drugs, medicaments and biological substances status: Secondary | ICD-10-CM | POA: Diagnosis not present

## 2020-07-13 DIAGNOSIS — I5030 Unspecified diastolic (congestive) heart failure: Secondary | ICD-10-CM | POA: Diagnosis not present

## 2020-07-13 DIAGNOSIS — I132 Hypertensive heart and chronic kidney disease with heart failure and with stage 5 chronic kidney disease, or end stage renal disease: Secondary | ICD-10-CM | POA: Insufficient documentation

## 2020-07-13 DIAGNOSIS — Z89511 Acquired absence of right leg below knee: Secondary | ICD-10-CM | POA: Insufficient documentation

## 2020-07-13 DIAGNOSIS — G4733 Obstructive sleep apnea (adult) (pediatric): Secondary | ICD-10-CM | POA: Diagnosis not present

## 2020-07-13 DIAGNOSIS — Z8249 Family history of ischemic heart disease and other diseases of the circulatory system: Secondary | ICD-10-CM | POA: Insufficient documentation

## 2020-07-13 DIAGNOSIS — D631 Anemia in chronic kidney disease: Secondary | ICD-10-CM | POA: Diagnosis not present

## 2020-07-13 DIAGNOSIS — X58XXXA Exposure to other specified factors, initial encounter: Secondary | ICD-10-CM | POA: Insufficient documentation

## 2020-07-13 DIAGNOSIS — Z905 Acquired absence of kidney: Secondary | ICD-10-CM | POA: Diagnosis not present

## 2020-07-13 DIAGNOSIS — E785 Hyperlipidemia, unspecified: Secondary | ICD-10-CM | POA: Insufficient documentation

## 2020-07-13 DIAGNOSIS — T8789 Other complications of amputation stump: Secondary | ICD-10-CM | POA: Insufficient documentation

## 2020-07-13 DIAGNOSIS — Z7902 Long term (current) use of antithrombotics/antiplatelets: Secondary | ICD-10-CM | POA: Diagnosis not present

## 2020-07-13 DIAGNOSIS — Z8616 Personal history of COVID-19: Secondary | ICD-10-CM | POA: Insufficient documentation

## 2020-07-13 DIAGNOSIS — Z992 Dependence on renal dialysis: Secondary | ICD-10-CM | POA: Diagnosis not present

## 2020-07-13 DIAGNOSIS — Z940 Kidney transplant status: Secondary | ICD-10-CM | POA: Diagnosis not present

## 2020-07-13 DIAGNOSIS — Z95818 Presence of other cardiac implants and grafts: Secondary | ICD-10-CM | POA: Diagnosis not present

## 2020-07-13 DIAGNOSIS — M009 Pyogenic arthritis, unspecified: Secondary | ICD-10-CM | POA: Diagnosis not present

## 2020-07-13 DIAGNOSIS — Z800 Family history of malignant neoplasm of digestive organs: Secondary | ICD-10-CM | POA: Insufficient documentation

## 2020-07-13 DIAGNOSIS — T8189XA Other complications of procedures, not elsewhere classified, initial encounter: Secondary | ICD-10-CM | POA: Diagnosis not present

## 2020-07-13 DIAGNOSIS — I214 Non-ST elevation (NSTEMI) myocardial infarction: Secondary | ICD-10-CM | POA: Diagnosis not present

## 2020-07-13 DIAGNOSIS — A419 Sepsis, unspecified organism: Secondary | ICD-10-CM | POA: Diagnosis not present

## 2020-07-13 HISTORY — DX: Anxiety disorder, unspecified: F41.9

## 2020-07-13 HISTORY — PX: WOUND DEBRIDEMENT: SHX247

## 2020-07-13 HISTORY — DX: Personal history of other medical treatment: Z92.89

## 2020-07-13 HISTORY — DX: Acute myocardial infarction, unspecified: I21.9

## 2020-07-13 HISTORY — DX: Other complications of anesthesia, initial encounter: T88.59XA

## 2020-07-13 HISTORY — DX: Other specified postprocedural states: Z98.890

## 2020-07-13 HISTORY — DX: Peripheral vascular disease, unspecified: I73.9

## 2020-07-13 HISTORY — PX: APPLICATION OF WOUND VAC: SHX5189

## 2020-07-13 HISTORY — DX: Other specified postprocedural states: R11.2

## 2020-07-13 LAB — POCT I-STAT, CHEM 8
BUN: 34 mg/dL — ABNORMAL HIGH (ref 8–23)
Calcium, Ion: 1.06 mmol/L — ABNORMAL LOW (ref 1.15–1.40)
Chloride: 99 mmol/L (ref 98–111)
Creatinine, Ser: 7 mg/dL — ABNORMAL HIGH (ref 0.61–1.24)
Glucose, Bld: 98 mg/dL (ref 70–99)
HCT: 29 % — ABNORMAL LOW (ref 39.0–52.0)
Hemoglobin: 9.9 g/dL — ABNORMAL LOW (ref 13.0–17.0)
Potassium: 4.3 mmol/L (ref 3.5–5.1)
Sodium: 138 mmol/L (ref 135–145)
TCO2: 29 mmol/L (ref 22–32)

## 2020-07-13 LAB — PROTIME-INR
INR: 3.4 — ABNORMAL HIGH (ref 0.8–1.2)
Prothrombin Time: 33.1 seconds — ABNORMAL HIGH (ref 11.4–15.2)

## 2020-07-13 SURGERY — DEBRIDEMENT, WOUND
Anesthesia: General | Laterality: Right

## 2020-07-13 MED ORDER — MIDAZOLAM HCL 5 MG/5ML IJ SOLN
INTRAMUSCULAR | Status: DC | PRN
  Administered 2020-07-13: 1 mg via INTRAVENOUS

## 2020-07-13 MED ORDER — OXYCODONE HCL 5 MG PO TABS
ORAL_TABLET | ORAL | Status: AC
  Administered 2020-07-13: 5 mg via ORAL
  Filled 2020-07-13: qty 1

## 2020-07-13 MED ORDER — ONDANSETRON HCL 4 MG/2ML IJ SOLN
INTRAMUSCULAR | Status: DC | PRN
  Administered 2020-07-13: 4 mg via INTRAVENOUS

## 2020-07-13 MED ORDER — MIDAZOLAM HCL 2 MG/2ML IJ SOLN
INTRAMUSCULAR | Status: AC
  Filled 2020-07-13: qty 2

## 2020-07-13 MED ORDER — DEXAMETHASONE SODIUM PHOSPHATE 10 MG/ML IJ SOLN
INTRAMUSCULAR | Status: DC | PRN
  Administered 2020-07-13: 10 mg via INTRAVENOUS

## 2020-07-13 MED ORDER — FENTANYL CITRATE (PF) 250 MCG/5ML IJ SOLN
INTRAMUSCULAR | Status: AC
  Filled 2020-07-13: qty 5

## 2020-07-13 MED ORDER — EPHEDRINE SULFATE 50 MG/ML IJ SOLN
INTRAMUSCULAR | Status: DC | PRN
  Administered 2020-07-13: 10 mg via INTRAVENOUS

## 2020-07-13 MED ORDER — PROPOFOL 10 MG/ML IV BOLUS
INTRAVENOUS | Status: DC | PRN
  Administered 2020-07-13: 30 mg via INTRAVENOUS
  Administered 2020-07-13: 130 mg via INTRAVENOUS

## 2020-07-13 MED ORDER — ACETAMINOPHEN 160 MG/5ML PO SOLN: 1000.0000 mg | Freq: Once | ORAL | Status: DC | PRN

## 2020-07-13 MED ORDER — ACETAMINOPHEN 10 MG/ML IV SOLN: 1000.0000 mg | Freq: Once | INTRAVENOUS | Status: DC | PRN

## 2020-07-13 MED ORDER — OXYCODONE HCL 5 MG PO TABS: 5.0000 mg | ORAL_TABLET | Freq: Once | ORAL | Status: AC | PRN

## 2020-07-13 MED ORDER — METOPROLOL TARTRATE 50 MG PO TABS
ORAL_TABLET | ORAL | Status: AC
  Filled 2020-07-13: qty 2

## 2020-07-13 MED ORDER — LIDOCAINE HCL (CARDIAC) PF 100 MG/5ML IV SOSY
PREFILLED_SYRINGE | INTRAVENOUS | Status: DC | PRN
  Administered 2020-07-13: 60 mg via INTRAVENOUS

## 2020-07-13 MED ORDER — 0.9 % SODIUM CHLORIDE (POUR BTL) OPTIME
TOPICAL | Status: DC | PRN
  Administered 2020-07-13: 1000 mL

## 2020-07-13 MED ORDER — CHLORHEXIDINE GLUCONATE 4 % EX LIQD: 60.0000 mL | Freq: Once | CUTANEOUS | Status: DC

## 2020-07-13 MED ORDER — CEFAZOLIN SODIUM-DEXTROSE 2-4 GM/100ML-% IV SOLN
2.0000 g | INTRAVENOUS | Status: AC
  Administered 2020-07-13: 2 g via INTRAVENOUS

## 2020-07-13 MED ORDER — FENTANYL CITRATE (PF) 250 MCG/5ML IJ SOLN
INTRAMUSCULAR | Status: DC | PRN
  Administered 2020-07-13 (×2): 50 ug via INTRAVENOUS

## 2020-07-13 MED ORDER — SODIUM CHLORIDE 0.9 % IV SOLN: INTRAVENOUS | Status: DC

## 2020-07-13 MED ORDER — FENTANYL CITRATE (PF) 100 MCG/2ML IJ SOLN: 25.0000 ug | INTRAMUSCULAR | Status: DC | PRN

## 2020-07-13 MED ORDER — OXYCODONE HCL 5 MG/5ML PO SOLN: 5.0000 mg | Freq: Once | ORAL | Status: AC | PRN

## 2020-07-13 MED ORDER — METOPROLOL TARTRATE 50 MG PO TABS
100.0000 mg | ORAL_TABLET | Freq: Once | ORAL | Status: AC
  Administered 2020-07-13: 100 mg via ORAL

## 2020-07-13 MED ORDER — CEFAZOLIN SODIUM-DEXTROSE 2-4 GM/100ML-% IV SOLN
INTRAVENOUS | Status: AC
  Filled 2020-07-13: qty 100

## 2020-07-13 MED ORDER — PHENYLEPHRINE HCL (PRESSORS) 10 MG/ML IV SOLN
INTRAVENOUS | Status: DC | PRN
  Administered 2020-07-13: 200 ug via INTRAVENOUS
  Administered 2020-07-13: 120 ug via INTRAVENOUS

## 2020-07-13 MED ORDER — OXYCODONE-ACETAMINOPHEN 5-325 MG PO TABS: 1.0000 | ORAL_TABLET | ORAL | 0 refills | Status: AC | PRN

## 2020-07-13 MED ORDER — ACETAMINOPHEN 500 MG PO TABS: 1000.0000 mg | ORAL_TABLET | Freq: Once | ORAL | Status: DC | PRN

## 2020-07-13 MED ORDER — CHLORHEXIDINE GLUCONATE 0.12 % MT SOLN
OROMUCOSAL | Status: AC
  Administered 2020-07-13: 15 mL
  Filled 2020-07-13: qty 15

## 2020-07-13 MED ORDER — FENTANYL CITRATE (PF) 100 MCG/2ML IJ SOLN
INTRAMUSCULAR | Status: AC
  Administered 2020-07-13: 25 ug via INTRAVENOUS
  Filled 2020-07-13: qty 2

## 2020-07-13 MED ORDER — PHENYLEPHRINE HCL-NACL 10-0.9 MG/250ML-% IV SOLN
INTRAVENOUS | Status: DC | PRN
  Administered 2020-07-13: 30 ug/min via INTRAVENOUS

## 2020-07-13 SURGICAL SUPPLY — 31 items
CANISTER SUCT 3000ML PPV (MISCELLANEOUS) ×2 IMPLANT
CNTNR URN SCR LID CUP LEK RST (MISCELLANEOUS) ×1 IMPLANT
CONT SPEC 4OZ STRL OR WHT (MISCELLANEOUS) ×1
COVER SURGICAL LIGHT HANDLE (MISCELLANEOUS) ×2 IMPLANT
COVER WAND RF STERILE (DRAPES) ×2 IMPLANT
DRAPE INCISE IOBAN 66X45 STRL (DRAPES) ×2 IMPLANT
DRAPE ORTHO SPLIT 77X108 STRL (DRAPES) ×1
DRAPE SURG ORHT 6 SPLT 77X108 (DRAPES) ×1 IMPLANT
DRSG VAC ATS LRG SENSATRAC (GAUZE/BANDAGES/DRESSINGS) ×4 IMPLANT
DRSG VAC ATS MED SENSATRAC (GAUZE/BANDAGES/DRESSINGS) ×2 IMPLANT
ELECT REM PT RETURN 9FT ADLT (ELECTROSURGICAL) ×2
ELECTRODE REM PT RTRN 9FT ADLT (ELECTROSURGICAL) ×1 IMPLANT
GLOVE BIO SURGEON STRL SZ7.5 (GLOVE) ×2 IMPLANT
GLOVE SRG 8 PF TXTR STRL LF DI (GLOVE) ×1 IMPLANT
GLOVE SURG UNDER POLY LF SZ8 (GLOVE) ×1
GOWN STRL REUS W/ TWL LRG LVL3 (GOWN DISPOSABLE) ×2 IMPLANT
GOWN STRL REUS W/ TWL XL LVL3 (GOWN DISPOSABLE) ×2 IMPLANT
GOWN STRL REUS W/TWL LRG LVL3 (GOWN DISPOSABLE) ×2
GOWN STRL REUS W/TWL XL LVL3 (GOWN DISPOSABLE) ×2
GRAFT MYRIAD 3 LAYER 10X20 (Graft) ×2 IMPLANT
KIT BASIN OR (CUSTOM PROCEDURE TRAY) ×2 IMPLANT
KIT TURNOVER KIT B (KITS) ×2 IMPLANT
NS IRRIG 1000ML POUR BTL (IV SOLUTION) ×2 IMPLANT
PACK GENERAL/GYN (CUSTOM PROCEDURE TRAY) ×2 IMPLANT
PAD ARMBOARD 7.5X6 YLW CONV (MISCELLANEOUS) ×4 IMPLANT
POWDER MYRIAD MORCELLS 500MG (Miscellaneous) ×2 IMPLANT
STAPLER VISISTAT 35W (STAPLE) IMPLANT
SUT ETHILON 2 0 PSLX (SUTURE) IMPLANT
SUT MNCRL AB 4-0 PS2 18 (SUTURE) ×6 IMPLANT
TOWEL GREEN STERILE (TOWEL DISPOSABLE) ×2 IMPLANT
WATER STERILE IRR 1000ML POUR (IV SOLUTION) IMPLANT

## 2020-07-13 NOTE — Anesthesia Procedure Notes (Signed)
Procedure Name: LMA Insertion Performed by: Mariea Clonts, CRNA Pre-anesthesia Checklist: Patient identified, Emergency Drugs available, Suction available and Patient being monitored Patient Re-evaluated:Patient Re-evaluated prior to induction Oxygen Delivery Method: Circle System Utilized Preoxygenation: Pre-oxygenation with 100% oxygen Induction Type: IV induction LMA: LMA inserted LMA Size: 4.0 Number of attempts: 1 Airway Equipment and Method: Bite block Placement Confirmation: positive ETCO2 Tube secured with: Tape Dental Injury: Teeth and Oropharynx as per pre-operative assessment

## 2020-07-13 NOTE — Op Note (Signed)
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  Sharp excisional debridement of right below-knee amputation wound (12 cm x 4 cm) 2.  Application of skin substitute for grafting of right below-knee amputation wound using Myriad Matrix and Myriad Morcells (12 cm x 4 cm wound, 48 cm2) 3.  Application of negative pressure wound VAC to right below-knee amputation wound  Surgeon: Dr. Marty Heck, MD  Assistant: Roxy Horseman, PA  Indications: Patient is a 65 year old male who has complex history of peripheral vascular disease.  He has undergone multiple tibial interventions in the right leg for nonhealing wound including retrograde tibial interventions.  Ultimately require below-knee amputation for nonhealing wounds.  He has had a nonhealing BKA stump and presents today for debridement and application of skin substitute after risk benefits discussed.  Findings: Sharp excisional debridement of right below-knee amputation wound with Metzenbaum scissors and scalpel.  Wound measured 12 cm x 4 cm.  Wound bed appeared healthy after debridement.  Ultimately Myriad Matrix and Myriad Morcells were applied to the woundand negative pressure wound VAC was placed.  Anesthesia: LMA  Details: Patient was taken to the operating room after informed consent was obtained.  Placed on the operative table in supine position.  Anesthesia induced and the right below-knee amputation stump including the wound was prepped and draped in standard sterile fashion.  Timeout was performed identify patient, procedure and site.  He did get preoperative antibiotics.  Initially Metzenbaum scissors were used to debride large eschar over the right below-knee amputation wound and this was debrided away with sharp excision.  I then used a scalpel and basically performed sharp debridement with currette of the wound to get down  to healthy appearing subcutaneous tissue that was bleeding.  We then copiously irrigated the wound.  That point time myriad morsels were applied to the wound bed and then this was covered with a myriad matrix.  The matrix was secured with multiple 4-0 Monocryl sutures.  We then placed a additional nonadhesive Adaptic layer over top of the myriad matrix.  This was secured with several 4-0 Monocryl as well.  KY jelly was then applied over top of the wound with both the morsels, matrix, and not adhesive layer and then this was covered with a medium sponge wound VAC that was trimmed according to size and secured with Ioban and a track pad and negative pressure suction was applied.  He remained stable and taken holding in stable condition.  Complication: None  Condition: Stable  Marty Heck, MD [facility] Office: [place]

## 2020-07-13 NOTE — Progress Notes (Signed)
Spoke to Dr. Ermalene Postin regarding HR.  Also informed him that patient took nitroglycerin 3 weeks ago for chest pain.  Administered Metoprolol 100mg  as patient did not take him home dose.  Ok with Dr. Ermalene Postin to give Metoprolol.

## 2020-07-13 NOTE — Anesthesia Preprocedure Evaluation (Signed)
Anesthesia Evaluation  Patient identified by MRN, date of birth, ID band Patient awake    Reviewed: Allergy & Precautions, NPO status , Patient's Chart, lab work & pertinent test results  History of Anesthesia Complications (+) PONV and history of anesthetic complications  Airway Mallampati: II  TM Distance: >3 FB Neck ROM: Full    Dental  (+) Teeth Intact, Dental Advisory Given   Pulmonary sleep apnea and Continuous Positive Airway Pressure Ventilation ,  Covid-19 Nucleic Acid Test Results Lab Results      Component                Value               Date                      SARSCOV2NAA              NEGATIVE            07/10/2020                [place]              NEGATIVE            07/02/2020                [place]              NEGATIVE            04/26/2020                [place]              NEGATIVE            04/12/2020                [place]              NEGATIVE            12/21/2019                SARSCOV2                 POSITIVE (A)        03/15/2019              breath sounds clear to auscultation       Cardiovascular hypertension, Pt. on medications (-) angina+ CAD, + Past MI, + CABG and + Peripheral Vascular Disease  + Valvular Problems/Murmurs  Rhythm:Regular + Systolic Click . Left ventricular ejection fraction, by estimation, is 45 to 50%. The  left ventricle has mildly decreased function. The left ventricle  demonstrates regional wall motion abnormalities (see scoring  diagram/findings for description). There is moderate  left ventricular hypertrophy. Left ventricular diastolic parameters are  indeterminate. There is moderate hypokinesis of the left ventricular,  basal-mid inferior wall and inferolateral wall.  2. Right ventricular systolic function is normal. The right ventricular  size is normal. Tricuspid regurgitation signal is inadequate for assessing  PA pressure.  3. Left atrial  size was moderately dilated.  4. Right atrial size was mildly dilated.  5. The mitral valve is normal in structure. No evidence of mitral valve  regurgitation. No evidence of mitral stenosis.  6. The aortic valve has been repaired/replaced. Aortic valve  regurgitation is not visualized. No aortic stenosis is present. There is a  St. Jude valve present in the aortic position. Aortic valve mean gradient  measures 11.0 mmHg.  1.  Severe underlying two-vessel coronary artery disease including the LAD and right coronary artery.  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  Patent LIMA to LAD and occluded SVG to diagonal.  The diagonal receives collaterals from the distal LAD.  Patent SVG to right PDA with 99% ostial stenosis. 2.  Successful angioplasty and drug-eluting stent placement to SVG to right PDA. 3.  Small hematoma in the right groin noted during the diagnostic portion of the cath with suspecting oozing around the sheath due to heavy calcifications in the common femoral artery.  This improved with upsizing the sheath and manual pressure.  This was stable at the end of the case after deploying a minx closure device and holding additional 10 minutes of pressure.    Neuro/Psych PSYCHIATRIC DISORDERS Anxiety Depression  Neuromuscular disease    GI/Hepatic negative GI ROS, Neg liver ROS,   Endo/Other    Renal/GU ESRF and DialysisRenal diseaseLab Results      Component                Value               Date                      CREATININE               7.00 (H)            07/13/2020           Lab Results      Component                Value               Date                      K                        4.3                 07/13/2020            Last hd 4/6      Musculoskeletal  (+) Arthritis ,   Abdominal   Peds  Hematology  (+) Blood dyscrasia, anemia , Lab Results      Component                Value               Date                       WBC                      3.6 (L)             07/07/2020                HGB                      9.9 (L)             07/13/2020                HCT                      29.0 (L)  07/13/2020                MCV                      103.0 (H)           07/07/2020                PLT                      107 (L)             07/07/2020            plavix   Anesthesia Other Findings 1. CAD - No anginal symptoms. No indication for ischemic evaluation. GDMT includes Metoprolol, Rosuvastatin, Zetia, Clopidogrel. No aspirin due to chronic anticoagulation. Continue heart healthy diet and regular cardiovascular exercise.   2. HFrEF - Most likely due to ischemic cardiomyopathy.  Most recent echo 09/2019 LVEF 45 to 50%.  Escalation of GDMT limited by hypotension and ESRD.  GDMT includes Lopressor.  Volume management per hemodialysis.  3. Chronic atrial fibrillation/flutter -long-term anticoagulation with warfarin given mechanical heart valve.  Continue metoprolol tartrate 100 mg twice daily.  Reports no symptomatic hypotension.  Heart rate mildly elevated today likely in the setting of infection and stress regarding upcoming procedure tomorrow.  4. Hyperlipidemia -continue high-dose rosuvastatin and Zetia.  5. Mechanical aortic valve replacement -continue long-term anticoagulation with warfarin.  6. ESRD on HD - Continue HD Monday Wednesday Friday and follow-up with nephrology.  7. PAD - Continue to follow with vascular.  Upcoming right BKA debridement and possible wound VAC placement.  Per AHA/ACC guidelines he is deemed acceptable risk for the planned procedure with no need for additional cardiovascular intervention. Due to hx of mechanical AVR would require bridging with Lovenox should need to stop anticoagulation arise. Most recent INR 07/10/20 of 3.3. Management per [facility].   Disposition: Follow up June 2022 with Dr. [name] as scheduled.   Signed, Loel Dubonnet,  NP 07/12/2020, 12:28 PM  Reproductive/Obstetrics                             Anesthesia Physical Anesthesia Plan  ASA: IV  Anesthesia Plan: General   Post-op Pain Management:    Induction: Intravenous  PONV Risk Score and Plan: 3 and Ondansetron, Dexamethasone and Treatment may vary due to age or medical condition  Airway Management Planned: LMA  Additional Equipment: None  Intra-op Plan:   Post-operative Plan: Extubation in OR  Informed Consent: I have reviewed the patients History and Physical, chart, labs and discussed the procedure including the risks, benefits and alternatives for the proposed anesthesia with the patient or authorized representative who has indicated his/her understanding and acceptance.     Dental advisory given  Plan Discussed with: CRNA and Surgeon  Anesthesia Plan Comments:         Anesthesia Quick Evaluation

## 2020-07-13 NOTE — H&P (Signed)
History and Physical Interval Note:  07/13/2020 10:28 AM  Jeremy [name]  has presented today for surgery, with the diagnosis of NON-HEALING AMPUTATION STUMP.  The various methods of treatment have been discussed with the patient and family. After consideration of risks, benefits and other options for treatment, the patient has consented to  Procedure(s): RIGHT BELOW KNEE AMPUTATION DEBRIDEMENT WITH MYRIAD SKIN SUBSTITUTE (Right) APPLICATION OF WOUND VAC (Right) as a surgical intervention.  The patient's history has been reviewed, patient examined, no change in status, stable for surgery.  I have reviewed the patient's chart and labs.  Questions were answered to the patient's satisfaction.    65 year old male with multiple medical issues with nonhealing right BKA.  We have been following this for some time in our office.  He was recently been at [facility] and underwent washout for a septic right knee.  I previously discussed right AKA which he has refused and has wanted to avoid at all costs.  Discussed the only other option would be debridement of his BKA stump with application of a skin substitute and wound VAC and the certainly may not work.  Plan to proceed today.  Jeremy [name]  CC:  follow up Requesting Provider:  Leone Haven, MD  HPI: Jeremy [name] is a 65 y.o. (1956-01-17) male who presents for evaluation of nonhealing right below the knee amputation.  This was performed by Dr. Carlis Abbott on 04/14/2020.  He was recently admitted to [facility] regional and underwent washout of septic right knee joint.  Patient states his knee feels much better since washout.  He and his wife continue to care for his open right below the knee amputation site with daily cleansing and dressing changes.  He is also receiving IV antibiotics per recommendation from infectious disease at [facility].  Infectious disease also recommended right above-the-knee amputation however patient is still adamantly against  this.  Surgical history also significant for repair of left groin pseudoaneurysm with subsequent Viabahn stenting of SFA, profunda, and common femoral artery by Dr. Lucky Cowboy.  He is having of L GT nail changes however denies pain.  He is on coumadin for mechanical aortic valve.       Past Medical History:  Diagnosis Date  . Abnormal stress test 03/19/2018  . 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 Aortic stenosis    a. 2014 s/p mech AVR ([facility])-->chronic coumadin; b. 08/2018 Echo: Mild AS/AI. Nl fxn/gradient.  . Arthritis   . Atrial fibrillation ([place])   . CAD (coronary artery disease)    a. 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 02/2018 MV ([facility]: large inf, infsept, inflat infarct w/ min rev, EF 30%; c. 06/2018 Cath: LM min irregs, LAD 60p, 95/30/44m, LCX min irregs, RCA 40p, 736m90d, RPAV 90, RPDA 95ost; d. 09/2018 CABGx3 (LIMA->LAD, VG->D1, VG->PDA).  . Chronic anticoagulation 06/2012   a. Coumadin in the setting of mech AVR and afib.  . Marland KitchenOVID-19 03/15/2019  . CPAP (continuous positive airway pressure) dependence   [facility]   Patient started HD around 1996,  had 1st transplant LLQ around 1998 until 2007 when they found renal cell cancer in transplant and both native kidneys, all were removed > went back on HD until 2nd transplant RLQ in 2014 which lasted 3 yrs; it was embolized for suspected acute rejection. [place] [facility] [place]  .Marland Kitchen  ESRD (end stage renal disease) ([place])    a. CKD 2/2 to IgA nephropathy (dx age 54); b. 1999 s/p Kidney transplant; c. 2014 Bilat nephrectomy (transplanted kidney resected) in the setting of renal cell carcinoma; d. PD until 11/2017-->HD.  Marland Kitchen Heart murmur   . HFrEF (heart failure with reduced ejection fraction) ([place])    a. 07/2016 Echo: EF 55-60%; b. 02/2018 MV: EF 30%; c. 04/2018 Echo: EF 35%; d. 08/2018 Echo: EF 30-35%. Glob HK  w/o rwma. Mildly reduced RV fxn. Mod to sev dil LA. Nl AoV fxn.  Marland Kitchen Hx of colonoscopy    2009 by Dr. Laural Golden. Normal except for small submucosal lipoma. No evidence of polyps or colitis.  . Hyperlipidemia   . Hypertension   . Hypogonadism in male 07/25/2015  . Ischemic cardiomyopathy    a. 07/2016 Echo: EF 55-60%; b. 02/2018 MV: EF 30%; c. 04/2018 Echo: EF 35%; d. 08/2018 Echo: EF 30-35%.  . OSA (obstructive sleep apnea)    No CPAP  . Permanent atrial fibrillation (HCC)    a. CHA2DS2VASc = 3-->Coumadin (also mech AVR); b. 06/2018 Amio started 2/2 elevated rates.  . Renal cell cancer ([place])   . Renal cell carcinoma ([name]) 08/04/2016         Past Surgical History:  Procedure Laterality Date  . ABDOMINAL AORTOGRAM W/LOWER EXTREMITY N/A 12/22/2019   Procedure: ABDOMINAL AORTOGRAM W/LOWER EXTREMITY;  Surgeon: Jeremy Heck, MD;  Location: [place] CV LAB;  Service: Cardiovascular;  Laterality: N/A;  . AMPUTATION Right 04/14/2020   Procedure: RIGHT BELOW KNEE AMPUTATION;  Surgeon: Jeremy Heck, MD;  Location: [place];  Service: Vascular;  Laterality: Right;  . AORTIC VALVE REPLACEMENT  3 /11 /2014   At [place]    . CAPD INSERTION N/A 02/19/2017   Procedure: LAPAROSCOPIC INSERTION CONTINUOUS AMBULATORY PERITONEAL DIALYSIS  (CAPD) CATHETER;  Surgeon: Algernon Huxley, MD;  Location: [facility];  Service: General;  Laterality: N/A;  . CORONARY ANGIOPLASTY WITH STENT PLACEMENT    . CORONARY ARTERY BYPASS GRAFT N/A 09/16/2018   Procedure: CORONARY ARTERY BYPASS GRAFT times three      with left internal mammary artery and endoharvest of right leg greater saphenous vein;  Surgeon: Melrose Nakayama, MD;  Location: [place];  Service: Open Heart Surgery;  Laterality: N/A;  . CORONARY STENT INTERVENTION N/A 07/15/2019   Procedure: CORONARY STENT INTERVENTION;  Surgeon: Wellington Hampshire, MD;  Location: [place] CV LAB;  Service:  Cardiovascular;  Laterality: N/A;  . DG AV DIALYSIS  SHUNT ACCESS EXIST*L* OR     left arm  . EPIGASTRIC HERNIA REPAIR N/A 02/19/2017   Procedure: HERNIA REPAIR EPIGASTRIC ADULT;  Surgeon: Robert Bellow, MD;  Location: [facility];  Service: General;  Laterality: N/A;  . EYE SURGERY     bilateral cataract  . HERNIA REPAIR     UMBILICAL HERNIA  . HERNIA REPAIR  02/19/2017  . INNER EAR SURGERY     20 years ago  . KIDNEY TRANSPLANT    . KNEE ARTHROSCOPY WITH MEDIAL MENISECTOMY Right 07/02/2020   Procedure: KNEE ARTHROSCOPY WITH INCISION AND DRAINAGE;  Surgeon: Leim Fabry, MD;  Location: [facility];  Service: Orthopedics;  Laterality: Right;  . LEFT HEART CATH AND CORS/GRAFTS ANGIOGRAPHY N/A 07/15/2019   Procedure: LEFT HEART CATH AND CORS/GRAFTS ANGIOGRAPHY;  Surgeon: Wellington Hampshire, MD;  Location: [place] CV LAB;  Service: Cardiovascular;  Laterality: N/A;  . LOWER EXTREMITY  ANGIOGRAPHY Right 08/25/2019   Procedure: LOWER EXTREMITY ANGIOGRAPHY;  Surgeon: Algernon Huxley, MD;  Location: [place] CV LAB;  Service: Cardiovascular;  Laterality: Right;  . LOWER EXTREMITY ANGIOGRAPHY Left 09/29/2019   Procedure: Lower Extremity Angiography;  Surgeon: Algernon Huxley, MD;  Location: [place] CV LAB;  Service: Cardiovascular;  Laterality: Left;  . PERIPHERAL VASCULAR INTERVENTION Right 12/22/2019   Procedure: PERIPHERAL VASCULAR INTERVENTION;  Surgeon: Jeremy Heck, MD;  Location: [place] CV LAB;  Service: Cardiovascular;  Laterality: Right;  . Peritoneal Dialysis access placement  02/19/2017  . PSEUDOANERYSM COMPRESSION N/A 09/29/2019   Procedure: PSEUDOANERYSM COMPRESSION;  Surgeon: Algernon Huxley, MD;  Location: [place] CV LAB;  Service: Cardiovascular;  Laterality: N/A;  . REMOVAL OF A DIALYSIS CATHETER N/A 11/23/2017   Procedure: REMOVAL OF A PERITONEAL DIALYSIS CATHETER;  Surgeon: Algernon Huxley, MD;  Location: [facility];  Service: Vascular;   Laterality: N/A;  . RIGHT/LEFT HEART CATH AND CORONARY ANGIOGRAPHY Bilateral 06/07/2018   Procedure: RIGHT/LEFT HEART CATH AND CORONARY ANGIOGRAPHY;  Surgeon: Wellington Hampshire, MD;  Location: [place] CV LAB;  Service: Cardiovascular;  Laterality: Bilateral;  . TEE WITHOUT CARDIOVERSION N/A 07/21/2016   Procedure: TRANSESOPHAGEAL ECHOCARDIOGRAM (TEE);  Surgeon: Wellington Hampshire, MD;  Location: [facility];  Service: Cardiovascular;  Laterality: N/A;  . TEE WITHOUT CARDIOVERSION N/A 09/16/2018   Procedure: TRANSESOPHAGEAL ECHOCARDIOGRAM (TEE);  Surgeon: Melrose Nakayama, MD;  Location: [place];  Service: Open Heart Surgery;  Laterality: N/A;    Social History        Socioeconomic History  . Marital status: Divorced    Spouse name: Not on file  . Number of children: 1  . Years of education: 75  . Highest education level: High school graduate  Occupational History  . Occupation: retired Forensic scientist -Disabled    Comment: Psychologist, educational  Tobacco Use  . Smoking status: Never Smoker  . Smokeless tobacco: Never Used  Vaping Use  . Vaping Use: Never used  Substance and Sexual Activity  . Alcohol use: Not Currently  . Drug use: No  . Sexual activity: Not Currently    Partners: Female  Other Topics Concern  . Not on file  Social History Narrative   Lives at home with girlfriend   1 daughter 4    Right handed    Social Determinants of Health   Financial Resource Strain: Not on file  Food Insecurity: Not on file  Transportation Needs: Not on file  Physical Activity: Not on file  Stress: Not on file  Social Connections: Not on file  Intimate Partner Violence: Not on file    Family History  Problem Relation Age of Onset  . Cancer Mother        pancreatic  . Heart disease Father   . Diabetes Father           Current Outpatient Medications  Medication Sig Dispense Refill  . acetaminophen (TYLENOL) 500 MG tablet Take 1 tablet (500 mg total) by mouth  every 4 (four) hours as needed. 30 tablet 0  . allopurinol (ZYLOPRIM) 100 MG tablet Take 1 tablet (100 mg total) by mouth daily. 30 tablet 0  . cefTAZidime 1 g in dextrose 5 % 50 mL Inject 1 g into the vein every Monday, Wednesday, and Friday with hemodialysis for 20 days.    . CHOLECALCIFEROL PO Take by mouth.    . clopidogrel (PLAVIX) 75 MG tablet Take 1 tablet (75 mg total) by mouth  daily with breakfast. 90 tablet 3  . ezetimibe (ZETIA) 10 MG tablet TAKE 1 TABLET BY MOUTH EVERY DAY (Patient taking differently: Take 10 mg by mouth daily.) 90 tablet 1  . hydroxychloroquine (PLAQUENIL) 200 MG tablet Take 400 mg by mouth at bedtime.    . lidocaine-prilocaine (EMLA) cream Apply 1 application topically daily as needed (for fistula).    . loperamide (IMODIUM A-D) 2 MG tablet Take 2 mg by mouth 4 (four) times daily as needed for diarrhea or loose stools.    . megestrol (MEGACE) 40 MG/ML suspension TAKE 10 ML (400 MG TOTAL) BY MOUTH DAILY.    . metoprolol tartrate (LOPRESSOR) 100 MG tablet Take 1 tablet (100 mg total) by mouth 2 (two) times daily. 60 tablet 0  . midodrine (PROAMATINE) 10 MG tablet TAKE 1 TABLET BY MOUTH 3 TIMES A WEEK AS DIRECTED TAKE 1 TAB BEFORE DIALYSIS MAY REPEAT ONCE AS NEED    . multivitamin (RENA-VIT) TABS tablet Take 1 tablet by mouth daily.    Marland Kitchen oxyCODONE-acetaminophen (PERCOCET/ROXICET) 5-325 MG tablet Take 1 tablet by mouth every 4 (four) hours as needed.    . pantoprazole (PROTONIX) 40 MG tablet Take 1 tablet (40 mg total) by mouth daily. 90 tablet 3  . polyethylene glycol (MIRALAX / GLYCOLAX) 17 g packet Take 17 g by mouth daily as needed for mild constipation. 30 each 0  . rosuvastatin (CRESTOR) 40 MG tablet Take 1 tablet (40 mg total) by mouth daily at 6 PM. 90 tablet 3  . sevelamer carbonate (RENVELA) 800 MG tablet Take 3,200 mg by mouth 3 (three) times daily with meals.    . traZODone (DESYREL) 50 MG tablet TAKE 1/2 -1 TABLET (25-50 MG TOTAL) BY MOUTH  AT BEDTIME AS NEEDED FOR SLEEP. (Patient taking differently: Take 25-50 mg by mouth at bedtime.) 90 tablet 2  . Vancomycin (VANCOCIN) 750-5 MG/150ML-% SOLN Inject 150 mLs (750 mg total) into the vein every Monday, Wednesday, and Friday with hemodialysis for 20 days. 4000 mL   . warfarin (COUMADIN) 1 MG tablet Take 1 tablet (1 mg total) by mouth daily.    . nitroGLYCERIN (NITROSTAT) 0.4 MG SL tablet Place 1 tablet (0.4 mg total) under the tongue every 5 (five) minutes as needed for chest pain. 25 tablet 3   No current facility-administered medications for this visit.         Allergies  Allergen Reactions  . Statins Other (See Comments)    Arthralgias (intolerance), Myalgias (intolerance)  Pt taking pravastatin   . Gabapentin     Drowsy   . Sertraline Rash     REVIEW OF SYSTEMS:   _0  denotes positive finding, _1  denotes negative finding Cardiac  Comments:  Chest pain or chest pressure:    Shortness of breath upon exertion:    Short of breath when lying flat:    Irregular heart rhythm:        Vascular    Pain in calf, thigh, or hip brought on by ambulation:    Pain in feet at night that wakes you up from your sleep:     Blood clot in your veins:    Leg swelling:         Pulmonary    Oxygen at home:    Productive cough:     Wheezing:         Neurologic    Sudden weakness in arms or legs:     Sudden numbness in arms or legs:  Sudden onset of difficulty speaking or slurred speech:    Temporary loss of vision in one eye:     Problems with dizziness:         Gastrointestinal    Blood in stool:     Vomited blood:         Genitourinary    Burning when urinating:     Blood in urine:        Psychiatric    Major depression:         Hematologic    Bleeding problems:    Problems with blood clotting too easily:        Skin    Rashes or ulcers:         Constitutional    Fever or chills:      PHYSICAL EXAMINATION:  Vitals:   07/10/20 1117  BP: 116/71  Pulse: (!) 103  Resp: 20  Temp: 98.2 F (36.8 C)  TempSrc: Temporal  SpO2: 100%  Weight: 157 lb 10.1 oz (71.5 kg)  Height: _0  (1.778 m)    General:  WDWN in NAD Gait: Not observed HENT: WNL, normocephalic Pulmonary: normal non-labored breathing Cardiac: regular HR Abdomen: soft, NT, no masses Skin: without rashes Vascular Exam/Pulses:  Right Left  Radial 2+ (normal) 2+ (normal)  DP BKA 1+ (weak) ATA  PT absent absent   Extremities: Large eschar right BKA stump; erythematous left great toe with toenail changes no purulence Musculoskeletal: no muscle wasting or atrophy       Neurologic: A&O X 3;  No focal weakness or paresthesias are detected Psychiatric:  The pt has Normal affect.   ASSESSMENT/PLAN:: 65 y.o. male here for evaluation of nonhealing right below the knee amputation  -Patient developed a septic right knee joint since last office visit which was washed out by the orthopedic group at [facility].  He continues to take IV antibiotics during dialysis treatments based on the recommendations from infectious disease.  It is unclear if there is any communication between his nonhealing right BKA and septic joint however it is certainly plausible -Dr. Carlis Abbott was also involved in the evaluation and management of this patient during office visit today.  Plan is to proceed with right BKA debridement and likely wound vac placement on Friday, 07/13/2020.  It was also discussed that this would be the final attempt to salvage the right knee.  If Mr. Kouba is unable to heal this attempt, he will require an above-the-knee amputation.  Patient and his wife are agreeable. -Continue Coumadin, and Plavix perioperatively -We will try to arrange a home health RN for wound VAC changes 3 days a week in an attempt to make this an outpatient surgery -It should also be  noted that patient has noticed redness and nail changes of L GT.  On physical exam L foot is well perfused.  Arterial duplex of LLE on 3/3 demonstrated a patent CFA, profunda, and SFA viabahn stents placed by Dr. Lucky Cowboy.  We will continue to monitor    Dagoberto Ligas, PA-C [facility] [phone]  Clinic MD:   Carlis Abbott

## 2020-07-13 NOTE — Discharge Instructions (Addendum)
Keep wound vac clean and dry do not remove vac f/u in office on Tuesday 07/24/20

## 2020-07-13 NOTE — Transfer of Care (Signed)
Immediate Anesthesia Transfer of Care Note  Patient: Jeremy [name]  Procedure(s) Performed: RIGHT BELOW KNEE AMPUTATION DEBRIDEMENT WITH MYRIAD SKIN SUBSTITUTE (Right ) APPLICATION OF WOUND VAC (Right )  Patient Location: PACU  Anesthesia Type:General  Level of Consciousness: awake, alert  and oriented  Airway & Oxygen Therapy: Patient Spontanous Breathing and Patient connected to nasal cannula oxygen  Post-op Assessment: Report given to RN and Post -op Vital signs reviewed and stable  Post vital signs: Reviewed and stable  Last Vitals:  Vitals Value Taken Time  BP 127/79 07/13/20 1214  Temp 36.1 C 07/13/20 1210  Pulse 118 07/13/20 1216  Resp 21 07/13/20 1216  SpO2 98 % 07/13/20 1216  Vitals shown include unvalidated device data.  Last Pain:  Vitals:   07/13/20 0842  TempSrc:   PainSc: 0-No pain         Complications: No complications documented.

## 2020-07-14 DIAGNOSIS — N2581 Secondary hyperparathyroidism of renal origin: Secondary | ICD-10-CM | POA: Diagnosis not present

## 2020-07-14 DIAGNOSIS — D631 Anemia in chronic kidney disease: Secondary | ICD-10-CM | POA: Diagnosis not present

## 2020-07-14 DIAGNOSIS — D509 Iron deficiency anemia, unspecified: Secondary | ICD-10-CM | POA: Diagnosis not present

## 2020-07-14 DIAGNOSIS — N186 End stage renal disease: Secondary | ICD-10-CM | POA: Diagnosis not present

## 2020-07-14 DIAGNOSIS — A499 Bacterial infection, unspecified: Secondary | ICD-10-CM | POA: Diagnosis not present

## 2020-07-14 DIAGNOSIS — Z992 Dependence on renal dialysis: Secondary | ICD-10-CM | POA: Diagnosis not present

## 2020-07-14 NOTE — Anesthesia Postprocedure Evaluation (Signed)
Anesthesia Post Note  Patient: Jeremy [name]  Procedure(s) Performed: RIGHT BELOW KNEE AMPUTATION DEBRIDEMENT WITH MYRIAD SKIN SUBSTITUTE (Right ) APPLICATION OF WOUND VAC (Right )     Patient location during evaluation: PACU Anesthesia Type: General Level of consciousness: awake and alert Pain management: pain level controlled Vital Signs Assessment: post-procedure vital signs reviewed and stable Respiratory status: spontaneous breathing, nonlabored ventilation, respiratory function stable and patient connected to nasal cannula oxygen Cardiovascular status: blood pressure returned to baseline and stable Postop Assessment: no apparent nausea or vomiting Anesthetic complications: no   No complications documented.  Last Vitals:  Vitals:   07/13/20 1245 07/13/20 1300  BP: (!) 122/95 129/66  Pulse: (!) 117 (!) 117  Resp: 20 17  Temp: (!) 36.1 C   SpO2: 100% 100%    Last Pain:  Vitals:   07/13/20 1245  TempSrc:   PainSc: 4                  Mailynn [place]

## 2020-07-16 ENCOUNTER — Other Ambulatory Visit: Payer: Self-pay

## 2020-07-16 ENCOUNTER — Encounter (HOSPITAL_COMMUNITY): Payer: Self-pay | Admitting: Vascular Surgery

## 2020-07-16 DIAGNOSIS — D509 Iron deficiency anemia, unspecified: Secondary | ICD-10-CM | POA: Diagnosis not present

## 2020-07-16 DIAGNOSIS — N186 End stage renal disease: Secondary | ICD-10-CM | POA: Diagnosis not present

## 2020-07-16 DIAGNOSIS — N2581 Secondary hyperparathyroidism of renal origin: Secondary | ICD-10-CM | POA: Diagnosis not present

## 2020-07-16 DIAGNOSIS — A499 Bacterial infection, unspecified: Secondary | ICD-10-CM | POA: Diagnosis not present

## 2020-07-16 DIAGNOSIS — Z992 Dependence on renal dialysis: Secondary | ICD-10-CM | POA: Diagnosis not present

## 2020-07-16 DIAGNOSIS — D631 Anemia in chronic kidney disease: Secondary | ICD-10-CM | POA: Diagnosis not present

## 2020-07-17 ENCOUNTER — Ambulatory Visit: Payer: Medicare Other | Admitting: Neurology

## 2020-07-18 ENCOUNTER — Emergency Department (HOSPITAL_COMMUNITY): Payer: Medicare Other

## 2020-07-18 ENCOUNTER — Telehealth: Payer: Self-pay

## 2020-07-18 ENCOUNTER — Emergency Department (HOSPITAL_COMMUNITY)
Admission: EM | Admit: 2020-07-18 | Discharge: 2020-07-18 | Disposition: A | Discharge status: Home / Self Care | Payer: Medicare Other | Attending: Emergency Medicine | Admitting: Emergency Medicine

## 2020-07-18 ENCOUNTER — Other Ambulatory Visit: Payer: Self-pay

## 2020-07-18 DIAGNOSIS — D631 Anemia in chronic kidney disease: Secondary | ICD-10-CM | POA: Diagnosis not present

## 2020-07-18 DIAGNOSIS — Z7901 Long term (current) use of anticoagulants: Secondary | ICD-10-CM | POA: Diagnosis not present

## 2020-07-18 DIAGNOSIS — K862 Cyst of pancreas: Secondary | ICD-10-CM | POA: Diagnosis not present

## 2020-07-18 DIAGNOSIS — K5900 Constipation, unspecified: Secondary | ICD-10-CM | POA: Insufficient documentation

## 2020-07-18 DIAGNOSIS — R109 Unspecified abdominal pain: Secondary | ICD-10-CM | POA: Diagnosis not present

## 2020-07-18 DIAGNOSIS — I120 Hypertensive chronic kidney disease with stage 5 chronic kidney disease or end stage renal disease: Secondary | ICD-10-CM | POA: Diagnosis not present

## 2020-07-18 DIAGNOSIS — R000 Tachycardia, unspecified: Secondary | ICD-10-CM | POA: Insufficient documentation

## 2020-07-18 DIAGNOSIS — N2581 Secondary hyperparathyroidism of renal origin: Secondary | ICD-10-CM | POA: Diagnosis not present

## 2020-07-18 DIAGNOSIS — R1030 Lower abdominal pain, unspecified: Secondary | ICD-10-CM | POA: Diagnosis present

## 2020-07-18 DIAGNOSIS — R102 Pelvic and perineal pain: Secondary | ICD-10-CM

## 2020-07-18 DIAGNOSIS — Z79899 Other long term (current) drug therapy: Secondary | ICD-10-CM | POA: Diagnosis not present

## 2020-07-18 DIAGNOSIS — Z8616 Personal history of COVID-19: Secondary | ICD-10-CM | POA: Insufficient documentation

## 2020-07-18 DIAGNOSIS — Z951 Presence of aortocoronary bypass graft: Secondary | ICD-10-CM | POA: Diagnosis not present

## 2020-07-18 DIAGNOSIS — Z992 Dependence on renal dialysis: Secondary | ICD-10-CM | POA: Diagnosis not present

## 2020-07-18 DIAGNOSIS — A499 Bacterial infection, unspecified: Secondary | ICD-10-CM | POA: Diagnosis not present

## 2020-07-18 DIAGNOSIS — Z85528 Personal history of other malignant neoplasm of kidney: Secondary | ICD-10-CM | POA: Insufficient documentation

## 2020-07-18 DIAGNOSIS — I517 Cardiomegaly: Secondary | ICD-10-CM | POA: Diagnosis not present

## 2020-07-18 DIAGNOSIS — Z7902 Long term (current) use of antithrombotics/antiplatelets: Secondary | ICD-10-CM | POA: Insufficient documentation

## 2020-07-18 DIAGNOSIS — I4891 Unspecified atrial fibrillation: Secondary | ICD-10-CM | POA: Diagnosis not present

## 2020-07-18 DIAGNOSIS — D509 Iron deficiency anemia, unspecified: Secondary | ICD-10-CM | POA: Diagnosis not present

## 2020-07-18 DIAGNOSIS — I2510 Atherosclerotic heart disease of native coronary artery without angina pectoris: Secondary | ICD-10-CM | POA: Insufficient documentation

## 2020-07-18 DIAGNOSIS — N186 End stage renal disease: Secondary | ICD-10-CM | POA: Insufficient documentation

## 2020-07-18 DIAGNOSIS — J811 Chronic pulmonary edema: Secondary | ICD-10-CM | POA: Diagnosis not present

## 2020-07-18 DIAGNOSIS — J90 Pleural effusion, not elsewhere classified: Secondary | ICD-10-CM | POA: Diagnosis not present

## 2020-07-18 DIAGNOSIS — K8020 Calculus of gallbladder without cholecystitis without obstruction: Secondary | ICD-10-CM | POA: Diagnosis not present

## 2020-07-18 LAB — BASIC METABOLIC PANEL
Anion gap: 13 (ref 5–15)
BUN: 17 mg/dL (ref 8–23)
CO2: 32 mmol/L (ref 22–32)
Calcium: 9.3 mg/dL (ref 8.9–10.3)
Chloride: 92 mmol/L — ABNORMAL LOW (ref 98–111)
Creatinine, Ser: 4.06 mg/dL — ABNORMAL HIGH (ref 0.61–1.24)
GFR, Estimated: 16 mL/min — ABNORMAL LOW (ref >60)
Glucose, Bld: 121 mg/dL — ABNORMAL HIGH (ref 70–99)
Potassium: 3.4 mmol/L — ABNORMAL LOW (ref 3.5–5.1)
Sodium: 137 mmol/L (ref 135–145)

## 2020-07-18 LAB — CBC
HCT: 31 % — ABNORMAL LOW (ref 39.0–52.0)
Hemoglobin: 9.2 g/dL — ABNORMAL LOW (ref 13.0–17.0)
MCH: 31.3 pg (ref 26.0–34.0)
MCHC: 29.7 g/dL — ABNORMAL LOW (ref 30.0–36.0)
MCV: 105.4 fL — ABNORMAL HIGH (ref 80.0–100.0)
Platelets: 160 10*3/uL (ref 150–400)
RBC: 2.94 MIL/uL — ABNORMAL LOW (ref 4.22–5.81)
RDW: 18.3 % — ABNORMAL HIGH (ref 11.5–15.5)
WBC: 5.7 10*3/uL (ref 4.0–10.5)
nRBC: 0 % (ref 0.0–0.2)

## 2020-07-18 LAB — POC OCCULT BLOOD, ED: Fecal Occult Bld: POSITIVE — AB

## 2020-07-18 MED ORDER — MORPHINE SULFATE (PF) 4 MG/ML IV SOLN
4.0000 mg | Freq: Once | INTRAVENOUS | Status: AC
  Administered 2020-07-18: 4 mg via INTRAVENOUS
  Filled 2020-07-18: qty 1

## 2020-07-18 MED ORDER — FENTANYL CITRATE (PF) 100 MCG/2ML IJ SOLN
50.0000 ug | INTRAMUSCULAR | Status: DC | PRN
  Administered 2020-07-18: 50 ug via INTRAVENOUS
  Filled 2020-07-18: qty 2

## 2020-07-18 MED ORDER — METOPROLOL TARTRATE 25 MG PO TABS
100.0000 mg | ORAL_TABLET | Freq: Once | ORAL | Status: AC
  Administered 2020-07-18: 100 mg via ORAL
  Filled 2020-07-18: qty 4

## 2020-07-18 MED ORDER — METHOCARBAMOL 1000 MG/10ML IJ SOLN: 1000.0000 mg | Freq: Once | INTRAMUSCULAR | Status: DC

## 2020-07-18 MED ORDER — POLYETHYLENE GLYCOL 3350 17 GM/SCOOP PO POWD: 17.0000 g | Freq: Three times a day (TID) | ORAL | 0 refills | Status: DC

## 2020-07-18 MED ORDER — METHOCARBAMOL 500 MG PO TABS: 500.0000 mg | ORAL_TABLET | Freq: Two times a day (BID) | ORAL | 0 refills | Status: DC

## 2020-07-18 MED ORDER — DICYCLOMINE HCL 10 MG PO CAPS
20.0000 mg | ORAL_CAPSULE | Freq: Once | ORAL | Status: AC
  Administered 2020-07-18: 20 mg via ORAL
  Filled 2020-07-18: qty 2

## 2020-07-18 MED ORDER — DICYCLOMINE HCL 20 MG PO TABS: 20.0000 mg | ORAL_TABLET | Freq: Two times a day (BID) | ORAL | 0 refills | Status: DC

## 2020-07-18 NOTE — Telephone Encounter (Signed)
Received VM about patient having pressure to urinate after being started on antibiotics at HD s/p BKA debridement. He does not make urine. Discussed with PA - have not heard of antibiotics causing bladder spasms. [facility] to see if they had suggestions. They also have not heard of antibiotics causing bladder spasms and suggested a bladder scan to see if patient had any urine present to be tested on in and out cath. Left VM with patient.

## 2020-07-18 NOTE — ED Notes (Signed)
Vital signs stable. 

## 2020-07-18 NOTE — ED Provider Notes (Signed)
[facility] EMERGENCY DEPARTMENT Provider Note   CSN: [id number] Arrival date & time: 07/18/20  1500     History No chief complaint on file.   Jeremy [name] is a 65 y.o. male.  HPI Pt w/ extensive past medical history including CAD, HLD, HTN, A. Fib on DOAC, dialysis Monday Wednesday Friday coming today from most recent dialysis appointment had full session.  HFrEF EF 45-50% 10/01/2019 septic right knee status post washout several months ago, issue with nonhealing right BKA.  Was recently admitted 07/13/2020 by Dr. Carlis Abbott of vascular surgery for debridement of BKA stump and wound VAC.  Patient is presented today with 4-5 days of achy suprapubic pain.  He states that his intermittent but present more than it is absent.  He states it is nonradiating.  He denies any associated nausea, vomiting, fevers, chills, lightheadedness, dizziness, chest pain, shortness of breath, cough, congestion, back pain or constipation or diarrhea.  He states that the pain is severe when it comes on.  Seems to last for several minutes in a spasm-like episode.  He denies any penile discharge.  He states that he has a stably enlarged left testicle.  DC from  after septic right joint 07/08/20 "She recommended IV vancomycin 750 mg IV and IV ceftazidime 1 g with hemodialysis on Mondays, Wednesdays and Fridays at discharge."    Past Medical History:  Diagnosis Date  . Abnormal stress test 03/19/2018  . 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 Anxiety    situation  . Aortic stenosis    a. 2014 s/p mech AVR ([facility])-->chronic coumadin; b. 08/2018 Echo: Mild AS/AI. Nl fxn/gradient.  . Arthritis   . Atrial fibrillation ([place])   . CAD (coronary artery disease)    a. 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 02/2018 MV ([facility]: large inf, infsept, inflat infarct w/ min rev, EF 30%; c. 06/2018 Cath: LM min irregs, LAD 60p, 95/30/63m, LCX min  irregs, RCA 40p, 719m90d, RPAV 90, RPDA 95ost; d. 09/2018 CABGx3 (LIMA->LAD, VG->D1, VG->PDA).  . Chronic anticoagulation 06/2012   a. Coumadin in the setting of mech AVR and afib.  . Complication of anesthesia   . COVID-19 03/15/2019  . CPAP (continuous positive airway pressure) dependence   [facility]   Patient started HD around 1996,  had 1st transplant LLQ around 1998 until 2007 when they found renal cell cancer in transplant and both native kidneys, all were removed > went back on HD until 2nd transplant RLQ in 2014 which lasted 3 yrs; it was embolized for suspected acute rejection. [place] [place]  . ESRD (end stage renal disease) ([place]   a. 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 1999 s/p Kidney transplant; c. 2014 Bilat nephrectomy (transplanted kidney resected) in the setting of renal cell carcinoma; d. PD until 11/2017-->HD.  . Marland Kitcheneart murmur   . HFrEF (heart failure with reduced ejection fraction) ([place]   a. 07/2016 Echo: EF 55-60%; b. 02/2018 MV: EF 30%; c. 04/2018 Echo: EF 35%; d. 08/2018 Echo: EF 30-35%. Glob HK w/o rwma. Mildly reduced RV fxn. Mod to sev dil LA. Nl AoV fxn.  . Marland Kitchenistory of blood transfusion   . Hx of colonoscopy    2009 by Dr. ReLaural GoldenNormal except for small submucosal lipoma. No evidence of polyps or colitis.  . Hyperlipidemia   .  Hypertension   . Hypogonadism in male 07/25/2015  . Ischemic cardiomyopathy    a. 07/2016 Echo: EF 55-60%; b. 02/2018 MV: EF 30%; c. 04/2018 Echo: EF 35%; d. 08/2018 Echo: EF 30-35%.  . Myocardial infarction ([place])   . OSA (obstructive sleep apnea)    No CPAP  . Peripheral vascular disease ([place])   . Permanent atrial fibrillation ([facility])    a. CHA2DS2VASc = 3-->Coumadin (also mech AVR); b. 06/2018 Am[place] started 2/2 elevated rates.  Marland Kitchen PONV (postoperative nausea and vomiting)   . Renal cell cancer ([place])   . Renal cell carcinoma ([place]) 08/04/2016    Patient Active Problem List    Diagnosis Date Noted  . Bacterial infection, unspecified 07/09/2020  . Septic arthritis ([place]) 07/02/2020  . Sepsis ([place]) 07/02/2020  . Hypercalcemia 06/12/2020  . Weight loss 05/31/2020  . Bronchitis 05/24/2020  . Postoperative pain   . Rheumatoid arthritis ([place])   . Anemia of chronic disease   . Macrocytosis   . Below-knee amputation of left lower extremity ([place]) 04/23/2020  . Critical lower limb ischemia ([place]) 04/14/2020  . Ischemic neuropathy of right foot 03/20/2020  . Femoral neuropathy of left lower extremity 03/20/2020  . Peripheral vascular disease ([place]) 12/26/2019  . PAD (peripheral artery disease) ([place]) 12/21/2019  . ILD (interstitial lung disease) ([place]) 12/06/2019  . Cough 12/06/2019  . Fatigue 10/19/2019  . Difficulty sleeping 10/19/2019  . Contusion of unspecified thigh, initial encounter 10/11/2019  . Other acute postprocedural pain 10/11/2019  . Demand ischemia ([place])   . Atrial flutter ([place])   . Non-ST elevation (NSTEMI) myocardial infarction ([place])   . Elevated troponin 10/01/2019  . Thigh hematoma 10/01/2019  . Postprocedural hypotension   . Hematoma of thigh, left, initial encounter 09/29/2019  . Unstable angina ([place]) 07/13/2019  . Atherosclerosis of native arteries of the extremities with ulceration ([place]) 07/05/2019  . Foot ulcer ([place]) 05/27/2019  . Skin lesion 11/17/2018  . Complication of vascular access for dialysis 10/01/2018  . S/P CABG x 3 09/16/2018  . Hydrocele 08/16/2018  . IBS (irritable bowel syndrome) 03/19/2018  . Depression 05/17/2017  . AV fistula ([place]) 05/17/2017  . Chronic pain of both knees 05/17/2017  . Arthralgia 05/17/2017  . Osteoarthritis 03/17/2017  . Trigger little finger of right hand 03/17/2017  . Liver disease, unspecified 03/17/2017  . 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 Atrial fibrillation ([place]) 10/30/2016  . Ventral hernia 10/14/2016  . History of renal cell carcinoma 08/04/2016  . Renal transplant  failure and rejection 07/31/2016  . Fever 07/17/2016  . Hypogonadism in male 07/25/2015  . 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 Gout 04/24/2015  . Proteinuria 01/03/2015  . Chronic anticoagulation 11/06/2014  . Iron deficiency anemia 11/02/2014  . Epigastric hernia 02/08/2014  . 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[place]) 08/16/2013  . Vitamin D deficiency 08/16/2013  . Spinal stenosis 06/20/2013  . OSA (obstructive sleep apnea)   . Tremor 12/30/2012  . History of kidney transplant 12/15/2012  . Immunosuppressive management encounter following kidney transplant 12/15/2012  . S/P angioplasty with stent 09/07/2012  . H/O mitral valve replacement with mechanical valve 09/07/2012  . Lumbar radiculopathy 09/06/2012  . ESRD (end stage renal disease) ([place]) 07/26/2012  . Hyperlipidemia 07/26/2012  . S/P AVR (aortic valve replacement) 06/23/2012  . Aortic valve disorder 06/14/2012  . Heart failure ([place]) 05/30/2012  . Thyroid nodule 06/26/2011  . CAD (coronary artery disease) 05/26/2011  . Essential hypertension 05/19/2011    Past Surgical History:  Procedure Laterality Date  . ABDOMINAL AORTOGRAM W/LOWER EXTREMITY N/A 12/22/2019   Procedure: ABDOMINAL AORTOGRAM W/LOWER EXTREMITY;  Surgeon: Marty Heck, MD;  Location: [facility] CV LAB;  Service: Cardiovascular;  Laterality: N/A;  . AMPUTATION Right 04/14/2020   Procedure: RIGHT BELOW KNEE AMPUTATION;  Surgeon: Marty Heck, MD;  Location: [facility];  Service: Vascular;  Laterality: Right;  . AORTIC VALVE REPLACEMENT  3 /11 /2014   At [place] Right 07/13/2020   Procedure: APPLICATION OF WOUND VAC;  Surgeon: Marty Heck, MD;  Location: [facility];  Service: Vascular;  Laterality: Right;  . BACK SURGERY    . CAPD INSERTION N/A 02/19/2017   Procedure: LAPAROSCOPIC INSERTION CONTINUOUS AMBULATORY PERITONEAL DIALYSIS  (CAPD) CATHETER;  Surgeon: Algernon Huxley,  MD;  Location: [facility];  Service: General;  Laterality: N/A;  . CORONARY ANGIOPLASTY WITH STENT PLACEMENT    . CORONARY ARTERY BYPASS GRAFT N/A 09/16/2018   Procedure: CORONARY ARTERY BYPASS GRAFT times three      with left internal mammary artery and endoharvest of right leg greater saphenous vein;  Surgeon: Melrose Nakayama, MD;  Location: [facility];  Service: Open Heart Surgery;  Laterality: N/A;  . CORONARY STENT INTERVENTION N/A 07/15/2019   Procedure: CORONARY STENT INTERVENTION;  Surgeon: Wellington Hampshire, MD;  Location: [facility] CV LAB;  Service: Cardiovascular;  Laterality: N/A;  . DG AV DIALYSIS  SHUNT ACCESS EXIST*L* OR     left arm  . EPIGASTRIC HERNIA REPAIR N/A 02/19/2017   Procedure: HERNIA REPAIR EPIGASTRIC ADULT;  Surgeon: Robert Bellow, MD;  Location: [facility];  Service: General;  Laterality: N/A;  . EYE SURGERY     bilateral cataract  . HERNIA REPAIR     UMBILICAL HERNIA  . HERNIA REPAIR  02/19/2017  . INNER EAR SURGERY     20 years ago  . KIDNEY TRANSPLANT    . KNEE ARTHROSCOPY WITH MEDIAL MENISECTOMY Right 07/02/2020   Procedure: KNEE ARTHROSCOPY WITH INCISION AND DRAINAGE;  Surgeon: Leim Fabry, MD;  Location: [facility];  Service: Orthopedics;  Laterality: Right;  . LEFT HEART CATH AND CORS/GRAFTS ANGIOGRAPHY N/A 07/15/2019   Procedure: LEFT HEART CATH AND CORS/GRAFTS ANGIOGRAPHY;  Surgeon: Wellington Hampshire, MD;  Location: [facility] CV LAB;  Service: Cardiovascular;  Laterality: N/A;  . LOWER EXTREMITY ANGIOGRAPHY Right 08/25/2019   Procedure: LOWER EXTREMITY ANGIOGRAPHY;  Surgeon: Algernon Huxley, MD;  Location: [facility] CV LAB;  Service: Cardiovascular;  Laterality: Right;  . LOWER EXTREMITY ANGIOGRAPHY Left 09/29/2019   Procedure: Lower Extremity Angiography;  Surgeon: Algernon Huxley, MD;  Location: [facility] CV LAB;  Service: Cardiovascular;  Laterality: Left;  . PERIPHERAL VASCULAR INTERVENTION Right 12/22/2019   Procedure: PERIPHERAL VASCULAR  INTERVENTION;  Surgeon: Marty Heck, MD;  Location: [place] CV LAB;  Service: Cardiovascular;  Laterality: Right;  . Peritoneal Dialysis access placement  02/19/2017  . PSEUDOANERYSM COMPRESSION N/A 09/29/2019   Procedure: PSEUDOANERYSM COMPRESSION;  Surgeon: Algernon Huxley, MD;  Location: [facility] CV LAB;  Service: Cardiovascular;  Laterality: N/A;  . REMOVAL OF A DIALYSIS CATHETER N/A 11/23/2017   Procedure: REMOVAL OF A PERITONEAL DIALYSIS CATHETER;  Surgeon: Algernon Huxley, MD;  Location: [facility];  Service: Vascular;  Laterality: N/A;  . RIGHT/LEFT HEART CATH AND CORONARY ANGIOGRAPHY Bilateral 06/07/2018   Procedure: RIGHT/LEFT HEART CATH AND CORONARY ANGIOGRAPHY;  Surgeon: Wellington Hampshire, MD;  Location: [facility]  CV LAB;  Service: Cardiovascular;  Laterality: Bilateral;  . TEE WITHOUT CARDIOVERSION N/A 07/21/2016   Procedure: TRANSESOPHAGEAL ECHOCARDIOGRAM (TEE);  Surgeon: Wellington Hampshire, MD;  Location: [facility];  Service: Cardiovascular;  Laterality: N/A;  . TEE WITHOUT CARDIOVERSION N/A 09/16/2018   Procedure: TRANSESOPHAGEAL ECHOCARDIOGRAM (TEE);  Surgeon: Melrose Nakayama, MD;  Location: [facility] [place];  Service: Open Heart Surgery;  Laterality: N/A;  . WOUND DEBRIDEMENT Right 07/13/2020   Procedure: RIGHT BELOW KNEE AMPUTATION DEBRIDEMENT WITH [facility];  Surgeon: Marty Heck, MD;  [facility];  Service: Vascular;  Laterality: Right;       Family History  Problem Relation Age of Onset  . Cancer Mother        pancreatic  . Heart disease Father   . Diabetes Father     Social History   Tobacco Use  . Smoking status: Never Smoker  . Smokeless tobacco: Never Used  Vaping Use  . Vaping Use: Never used  Substance Use Topics  . Alcohol use: Not Currently  . Drug use: No    Home Medications Prior to Admission medications   Medication Sig Start Date End Date Taking? Authorizing Provider  dicyclomine (BENTYL) 20 MG tablet Take 1 tablet (20  mg total) by mouth 2 (two) times daily. 07/18/20  Yes Theo Reither S, PA  methocarbamol (ROBAXIN) 500 MG tablet Take 1 tablet (500 mg total) by mouth 2 (two) times daily. 07/18/20  Yes Dorinda Stehr S, PA  polyethylene glycol powder (GLYCOLAX/MIRALAX) 17 GM/SCOOP powder Take 17 g by mouth in the morning, at noon, and at bedtime. One scoop in beverage of your choice with each meal. You may increase if you continue to be constipated and decrease if your stool becomes too loose. 07/18/20  Yes Leyan Branden, [place] S, PA  acetaminophen (TYLENOL) 500 MG tablet Take 1 tablet (500 mg total) by mouth every 4 (four) hours as needed. 05/03/20   Love, Ivan Anchors, PA-C  allopurinol (ZYLOPRIM) 100 MG tablet Take 1 tablet (100 mg total) by mouth daily. 05/03/20   Bary Leriche, PA-C  cefTAZidime 1 g in dextrose 5 % 50 mL Inject 1 g into the vein every Monday, Wednesday, and Friday with hemodialysis for 20 days. 07/09/20 07/29/20  Jennye Boroughs, MD  CHOLECALCIFEROL PO Take by mouth. 05/14/20 05/13/21  [provider]  clopidogrel (PLAVIX) 75 MG tablet Take 1 tablet (75 mg total) by mouth daily with breakfast. 07/29/19   Wellington Hampshire, MD  ezetimibe (ZETIA) 10 MG tablet TAKE 1 TABLET BY MOUTH EVERY DAY 03/19/20   Dunn, Areta Haber, PA-C  hydroxychloroquine (PLAQUENIL) 200 MG tablet Take 400 mg by mouth at bedtime. 09/01/19   [provider]  lidocaine-prilocaine (EMLA) cream Apply 1 application topically daily as needed (for fistula).    [provider]  loperamide (IMODIUM A-D) 2 MG tablet Take 2 mg by mouth 4 (four) times daily as needed for diarrhea or loose stools.    [provider]  megestrol (MEGACE) 40 MG/ML suspension TAKE 10 ML (400 MG TOTAL) BY MOUTH DAILY. 05/04/20   [provider]  metoprolol tartrate (LOPRESSOR) 100 MG tablet Take 1 tablet (100 mg total) by mouth 2 (two) times daily. 07/08/20   Jennye Boroughs, MD  midodrine (PROAMATINE) 10 MG tablet TAKE 1 TABLET BY MOUTH 3 TIMES  A WEEK AS DIRECTED TAKE 1 TAB BEFORE DIALYSIS MAY REPEAT ONCE AS NEED 06/13/20   [provider]  multivitamin (RENA-VIT) TABS tablet Take  1 tablet by mouth daily. 02/23/19   [provider]  nitroGLYCERIN (NITROSTAT) 0.4 MG SL tablet Place 1 tablet (0.4 mg total) under the tongue every 5 (five) minutes as needed for chest pain. 06/24/19 03/13/20  Loel Dubonnet, NP  oxyCODONE-acetaminophen (PERCOCET/ROXICET) 5-325 MG tablet Take 1 tablet by mouth every 4 (four) hours as needed. 07/13/20   Ulyses Amor, PA-C  pantoprazole (PROTONIX) 40 MG tablet Take 1 tablet (40 mg total) by mouth daily. 07/29/19   Wellington Hampshire, MD  polyethylene glycol (MIRALAX / GLYCOLAX) 17 g packet Take 17 g by mouth daily as needed for mild constipation. 05/03/20   Love, Ivan Anchors, PA-C  rosuvastatin (CRESTOR) 40 MG tablet Take 1 tablet (40 mg total) by mouth daily at 6 PM. 07/29/19   Wellington Hampshire, MD  sevelamer carbonate (RENVELA) 800 MG tablet Take 3,200 mg by mouth 3 (three) times daily with meals.    [provider]  traZODone (DESYREL) 50 MG tablet TAKE 1/2 -1 TABLET (25-50 MG TOTAL) BY MOUTH AT BEDTIME AS NEEDED FOR SLEEP. 11/10/19   Leone Haven, MD  Vancomycin [facility]) 750-5 MG/150ML-% SOLN Inject 150 mLs (750 mg total) into the vein every Monday, Wednesday, and Friday with hemodialysis for 20 days. 07/09/20 07/29/20  Jennye Boroughs, MD  warfarin (COUMADIN) 1 MG tablet Take 1 tablet (1 mg total) by mouth daily. 07/08/20   Jennye Boroughs, MD    Allergies    Statins, Gabapentin, and Sertraline  Review of Systems   Review of Systems  Constitutional: Negative for chills and fever.  HENT: Negative for congestion.   Eyes: Negative for pain.  Respiratory: Negative for cough and shortness of breath.   Cardiovascular: Negative for chest pain and leg swelling.  Gastrointestinal: Positive for abdominal pain. Negative for diarrhea, nausea and vomiting.  Genitourinary: Negative for dysuria.   Musculoskeletal: Negative for myalgias.  Skin: Negative for rash.  Neurological: Negative for dizziness and headaches.    Physical Exam Updated Vital Signs BP 132/75   Pulse (!) 122   Temp 98.1 F (36.7 C) (Oral)   Resp 18   SpO2 99%   Physical Exam Vitals and nursing note reviewed.  Constitutional:      General: He is not in acute distress.    Comments: Somewhat uncomfortable appearing 65 year old male.  HENT:     Head: Normocephalic and atraumatic.     Nose: Nose normal.  Eyes:     General: No scleral icterus. Cardiovascular:     Rate and Rhythm: Regular rhythm. Tachycardia present.     Pulses: Normal pulses.     Heart sounds: Normal heart sounds.  Pulmonary:     Effort: Pulmonary effort is normal. No respiratory distress.     Breath sounds: No wheezing.  Abdominal:     Palpations: Abdomen is soft.     Tenderness: There is no abdominal tenderness.  Genitourinary:    Comments: Left testicle is enlarged--no changes per patient No tenderness of scrotum or testes Penis without urethral tenderness or penile discharge.  Guaiac positive.  Soft brown stool no melena hematochezia present.  Hemorrhoids present. Musculoskeletal:     Cervical back: Normal range of motion.     Right lower leg: No edema.     Left lower leg: No edema.  Skin:    General: Skin is warm and dry.     Capillary Refill: Capillary refill takes less than 2 seconds.  Neurological:     Mental Status: He is  alert. Mental status is at baseline.  Psychiatric:        Mood and Affect: Mood normal.        Behavior: Behavior normal.     ED Results / Procedures / Treatments   Labs (all labs ordered are listed, but only abnormal results are displayed) Labs Reviewed  BASIC METABOLIC PANEL - Abnormal; Notable for the following components:      Result Value   Potassium 3.4 (*)    Chloride 92 (*)    Glucose, Bld 121 (*)    Creatinine, Ser 4.06 (*)    GFR, Estimated 16 (*)    All other components within  normal limits  CBC - Abnormal; Notable for the following components:   RBC 2.94 (*)    Hemoglobin 9.2 (*)    HCT 31.0 (*)    MCV 105.4 (*)    MCHC 29.7 (*)    RDW 18.3 (*)    All other components within normal limits  POC OCCULT BLOOD, ED - Abnormal; Notable for the following components:   Fecal Occult Bld POSITIVE (*)    All other components within normal limits    EKG EKG Interpretation  Date/Time:  Wednesday July 18 2020 16:32:02 EDT Ventricular Rate:  120 PR Interval:    QRS Duration: 174 QT Interval:  433 QTC Calculation: 612 R Axis:   98 Text Interpretation: afib w rvr Right bundle branch block Baseline wander in lead(s) III V2 V3 V4 V5 V6 No significant change since last tracing Confirmed by Deno Etienne [phone]) on 07/18/2020 4:37:33 PM   Radiology CT ABDOMEN PELVIS WO CONTRAST  Result Date: 07/18/2020 CLINICAL DATA:  65 year old with suprapubic pain. History of renal cell carcinoma. EXAM: CT ABDOMEN AND PELVIS WITHOUT CONTRAST TECHNIQUE: Multidetector CT imaging of the abdomen and pelvis was performed following the standard protocol without IV contrast. COMPARISON:  03/06/2020 and chest CT 04/19/2020 FINDINGS: Lower chest: Trace right pleural fluid. Heart is enlarged. Coronary artery calcifications. Hepatobiliary: High-density material layering in the gallbladder is similar to the previous examination and could represent small stones and/or sludge. Small amount of perihepatic ascites. Pancreas: No evidence for acute inflammation or significant duct dilatation. However, there is a new low-density structure along the medial aspect of the pancreatic head and uncinate process on sequence 3, image 38 that measures 1.4 cm. Hounsfield units of the structure is 1.4. This is suggestive for a small cystic structure which was not clearly present on the previous examination. Spleen: Normal in size without focal abnormality. Adrenals/Urinary Tract: No gross abnormality to the adrenal glands.  Evidence for bilateral nephrectomies. Urinary bladder is decompressed. There is a transplant kidney in the right lower quadrant with extensive parenchymal calcifications and multiple endovascular coils near the hilum. Stomach/Bowel: Normal appendix. No evidence for bowel obstruction or acute bowel inflammation. Vascular/Lymphatic: Extensive atherosclerotic calcifications throughout the abdomen and pelvis. Negative for an abdominal aortic aneurysm. No significant lymph node enlargement in the abdomen or pelvis. Reproductive: Prostate is unremarkable. Other: Mild presacral edema which is new. Trace perihepatic ascites. Musculoskeletal: Diffuse sclerosis throughout the bones compatible with chronic kidney disease. Disc space narrowing at L5-S1. mild subcutaneous edema. IMPRESSION: 1. Trace right pleural fluid with trace perihepatic ascites. New presacral edema. Findings could be associated with mild fluid overload state. 2. New 1.4 cm low-density collection near the pancreatic head and uncinate process. This could represent a small cyst but incompletely characterized. Based on history renal cell carcinoma, consider 3 month follow-up MRI to further characterize this  area. 3. Bilateral nephrectomies with a transplant kidney. 4. Extensive atherosclerotic disease. Aortic Atherosclerosis (ICD10-I70.0). 5. Cholelithiasis.  No significant gallbladder distension. Electronically Signed   By: Markus Daft M.D.   On: 07/18/2020 17:44   DG Chest 2 View  Result Date: 07/18/2020 CLINICAL DATA:  Left basilar crackles, afebrile, likely atelectasis. EXAM: CHEST - 2 VIEW COMPARISON:  Radiograph 07/02/2020. FINDINGS: Post median sternotomy and CABG. Chronic cardiomegaly unchanged. Peribronchial thickening. Mild septal thickening at the lung bases, progression on the right. Trace fluid in the right minor fissure with small sub pulmonic effusions. No confluent consolidation. No pneumothorax. Remote right posterior sixth rib fracture.  IMPRESSION: 1. Slight increase in septal thickening at the lung bases suspicious for pulmonary edema. Small pleural effusions as well as trace fluid in the right minor fissure. 2. Chronic cardiomegaly post CABG. Aortic Atherosclerosis (ICD10-I70.0). Electronically Signed   By: Keith Rake M.D.   On: 07/18/2020 16:55    Procedures Procedures   Medications Ordered in ED Medications  metoprolol tartrate (LOPRESSOR) tablet 100 mg (100 mg Oral Given 07/18/20 1731)  morphine 4 MG/ML injection 4 mg (4 mg Intravenous Given 07/18/20 1850)  dicyclomine (BENTYL) capsule 20 mg (20 mg Oral Given 07/18/20 1850)    ED Course  I have reviewed the triage vital signs and the nursing notes.  Pertinent labs & imaging results that were available during my care of the patient were reviewed by me and considered in my medical decision making (see chart for details).  Patient is 65 year old male with lengthy past medical history detailed in HPI.  Patient is presented today with suprapubic pain.  It is crampy and burning.  He describes it as feeling like a urinary tract infection.  He states he had a urinary tract infection many years ago.  He denies any history of sexually transmitted disease and denies any concern for this.  No penile discharge.  Physical exam is unremarkable.  Patient is not significantly tender in the suprapubic area but points to this region when describing his pain. The pain is nonradiating.  He has good sensation in lower extremities and good bilateral radial artery and DP PT pulses.  Low suspicion for dissection given he has no chest pain, has normal blood pressures during ER evaluation and monitoring.  Has good pulses throughout. Low suspicion for abdominal aortic aneurysm.  Doubt UTI given the patient is no longer producing urine. Bladder scan revealed no significant urine in bladder. BMP unremarkable.  Creatinine within reasonable limits for patient given that he had dialysis earlier  today.  Patient perhaps slightly volume down given that he was just dialyzed.  He also has not taken his metoprolol which is dosed twice daily.  He was given 1 dose here.  Suspect that his tachycardia is related to some dehydration from his recent dialysis as well as from him not taking his beta-blocker.  CBC without leukocytosis, chronic anemia is present and unchanged from prior.  Soft brown stool on DRE.  No prostatic tenderness. Chest x-ray without any infiltrate.  Chronic appearing evidence of fluid overload. CT abdomen pelvis without contrast shows no acute abnormality that explains his symptoms.  Clinical Course as of 07/18/20 1955  Wed Jul 18, 2020  1753 CT ABDOMEN PELVIS WO CONTRAST [WF]    Clinical Course User Index [WF] Tedd Sias, [place]   MDM Rules/Calculators/A&P  I discussed this case with my attending physician who cosigned this note including patient's presenting symptoms, physical exam, and planned diagnostics and interventions. Attending physician stated agreement with plan or made changes to plan which were implemented.   Attending physician assessed patient at bedside.   We will discharge home with Bentyl, MiraLAX, Robaxin. He will follow-up with urology.  Also will follow up with PCP.  Given strict return precautions.  Final Clinical Impression(s) / ED Diagnoses Final diagnoses:  Suprapubic pain  Constipation, unspecified constipation type    Rx / DC Orders ED Discharge Orders         Ordered    dicyclomine (BENTYL) 20 MG tablet  2 times daily        07/18/20 1904    polyethylene glycol powder (GLYCOLAX/MIRALAX) 17 GM/SCOOP powder  3 times daily        07/18/20 1904    methocarbamol (ROBAXIN) 500 MG tablet  2 times daily        07/18/20 1951           Tedd Sias, [place] 07/18/20 1956    Deno Etienne, DO 07/18/20 1959

## 2020-07-18 NOTE — ED Notes (Signed)
Patient transported to CT 

## 2020-07-18 NOTE — ED Notes (Signed)
Patient verbalizes understanding of discharge instructions. Opportunity for questioning and answers were provided. Armband removed by staff, pt discharged from ED via wheelchair.  

## 2020-07-18 NOTE — ED Notes (Signed)
Patient is resting comfortably. 

## 2020-07-18 NOTE — Telephone Encounter (Signed)
Patient called stating that he is having severe lower pelvic pressure and urgency to urinate. 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 Patient states that his symptoms started over the weekend and have gradually worsened. He states he is having regular BM and did have one this morning. He has no blood or drainage/discharge from penis, no fever, or chills.  Possible bladder spasms most likely not retention with personal history. Patient was added on to schedule for further evaluation. He does have transportation issues and will call back if he needs to reschedule.

## 2020-07-18 NOTE — ED Notes (Signed)
Bladder scan 1 ml

## 2020-07-18 NOTE — ED Notes (Signed)
Family updated as to patient's status.

## 2020-07-18 NOTE — ED Triage Notes (Signed)
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 Pain is intermittent/feels like spasms.

## 2020-07-18 NOTE — Discharge Instructions (Addendum)
Your work-up today was reassuringly without any significant abnormalities.  Your lab work was without any evidence of untreated/worsening infection.  Your electrolytes are within normal limits.  You are currently on 2 sternal antibiotics which may very well be causing some of your symptoms. Your CT scan also showed that you have some constipated/increased stool in your colon which may be causing some referred pain.  I will provide you with a medication called Bentyl for pain.  Please take your home pain medications as prescribed.  Please drink water and use MiraLAX 1 capful in beverage of your choice with each meal.  You may take an additional dose pursing in the morning.  Please follow-up with urology.  If you do not have a urologist you may follow-up with [facility].  I given you the information for their office.  Your rectal exam was reassuring I have a low suspicion for infection of your prostate.  By treating your constipation and seeing if this improves while waiting for your follow-up appointment with your primary care doctor and urologist we can help narrow down the list of things that may be causing her symptoms.  AS WE DISCUSSED -- THESE MEDS CAN CAUSE DROWSINESS AND CAN INCREASE THE RISK OF FALLS. ONLY USE ROBAXIN WHEN IN SAFE ENVIRONMENT. Use caution.

## 2020-07-19 ENCOUNTER — Encounter: Payer: Self-pay | Admitting: Cardiovascular Disease

## 2020-07-19 ENCOUNTER — Ambulatory Visit: Payer: Self-pay | Admitting: Urology

## 2020-07-19 DIAGNOSIS — M069 Rheumatoid arthritis, unspecified: Secondary | ICD-10-CM | POA: Diagnosis not present

## 2020-07-19 DIAGNOSIS — M009 Pyogenic arthritis, unspecified: Secondary | ICD-10-CM | POA: Diagnosis not present

## 2020-07-19 DIAGNOSIS — A419 Sepsis, unspecified organism: Secondary | ICD-10-CM | POA: Diagnosis not present

## 2020-07-19 DIAGNOSIS — M1711 Unilateral primary osteoarthritis, right knee: Secondary | ICD-10-CM | POA: Diagnosis not present

## 2020-07-19 DIAGNOSIS — T8789 Other complications of amputation stump: Secondary | ICD-10-CM | POA: Diagnosis not present

## 2020-07-19 DIAGNOSIS — T8189XA Other complications of procedures, not elsewhere classified, initial encounter: Secondary | ICD-10-CM | POA: Diagnosis not present

## 2020-07-19 LAB — POCT INR: INR: 3.4 — AB (ref 2.0–3.0)

## 2020-07-20 ENCOUNTER — Ambulatory Visit (INDEPENDENT_AMBULATORY_CARE_PROVIDER_SITE_OTHER): Payer: Medicare Other | Admitting: Cardiovascular Disease

## 2020-07-20 DIAGNOSIS — N186 End stage renal disease: Secondary | ICD-10-CM | POA: Diagnosis not present

## 2020-07-20 DIAGNOSIS — A499 Bacterial infection, unspecified: Secondary | ICD-10-CM | POA: Diagnosis not present

## 2020-07-20 DIAGNOSIS — D631 Anemia in chronic kidney disease: Secondary | ICD-10-CM | POA: Diagnosis not present

## 2020-07-20 DIAGNOSIS — Z952 Presence of prosthetic heart valve: Secondary | ICD-10-CM | POA: Diagnosis not present

## 2020-07-20 DIAGNOSIS — I480 Paroxysmal atrial fibrillation: Secondary | ICD-10-CM

## 2020-07-20 DIAGNOSIS — N2581 Secondary hyperparathyroidism of renal origin: Secondary | ICD-10-CM | POA: Diagnosis not present

## 2020-07-20 DIAGNOSIS — Z992 Dependence on renal dialysis: Secondary | ICD-10-CM | POA: Diagnosis not present

## 2020-07-20 DIAGNOSIS — D509 Iron deficiency anemia, unspecified: Secondary | ICD-10-CM | POA: Diagnosis not present

## 2020-07-23 DIAGNOSIS — D509 Iron deficiency anemia, unspecified: Secondary | ICD-10-CM | POA: Diagnosis not present

## 2020-07-23 DIAGNOSIS — Z992 Dependence on renal dialysis: Secondary | ICD-10-CM | POA: Diagnosis not present

## 2020-07-23 DIAGNOSIS — N2581 Secondary hyperparathyroidism of renal origin: Secondary | ICD-10-CM | POA: Diagnosis not present

## 2020-07-23 DIAGNOSIS — N186 End stage renal disease: Secondary | ICD-10-CM | POA: Diagnosis not present

## 2020-07-23 DIAGNOSIS — A499 Bacterial infection, unspecified: Secondary | ICD-10-CM | POA: Diagnosis not present

## 2020-07-23 DIAGNOSIS — D631 Anemia in chronic kidney disease: Secondary | ICD-10-CM | POA: Diagnosis not present

## 2020-07-24 ENCOUNTER — Other Ambulatory Visit: Payer: Self-pay

## 2020-07-24 ENCOUNTER — Ambulatory Visit (INDEPENDENT_AMBULATORY_CARE_PROVIDER_SITE_OTHER): Payer: Medicare Other | Admitting: Physician Assistant

## 2020-07-24 VITALS — BP 93/66 | HR 108 | Temp 98.6°F | Resp 20 | Ht 70.0 in

## 2020-07-24 DIAGNOSIS — T8189XD Other complications of procedures, not elsewhere classified, subsequent encounter: Secondary | ICD-10-CM | POA: Diagnosis not present

## 2020-07-24 DIAGNOSIS — Z9889 Other specified postprocedural states: Secondary | ICD-10-CM | POA: Diagnosis not present

## 2020-07-24 NOTE — Progress Notes (Signed)
POST OPERATIVE OFFICE NOTE    CC:  F/u for surgery  HPI:  This is a 65 y.o. male who is s/p  Procedure: 1.  Sharp excisional debridement of right below-knee amputation wound (12 cm x 4 cm) 2.  Application of skin substitute for grafting of right below-knee amputation wound using Myriad Matrix and Myriad Morcells (12 cm x 4 cm wound, 48 cm2) 3.  Application of negative pressure wound VAC to right below-knee amputation wound  Patient is a 65 year old male who has complex history of peripheral vascular disease.  He has undergone multiple tibial interventions in the right leg for nonhealing wound including retrograde tibial interventions.  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  He has had a nonhealing BKA stump and underwent debridement and application of skin substitute on 07/13/2020 by Dr. Carlis Abbott.  His wife accompanies him today and they report good seal to [facility] dressing since surgery.  Allergies  Allergen Reactions  . Statins Other (See Comments)    Arthralgias (intolerance), Myalgias (intolerance)  Pt taking pravastatin   . Gabapentin     Drowsy   . Sertraline Rash    Current Outpatient Medications  Medication Sig Dispense Refill  . acetaminophen (TYLENOL) 500 MG tablet Take 1 tablet (500 mg total) by mouth every 4 (four) hours as needed. 30 tablet 0  . allopurinol (ZYLOPRIM) 100 MG tablet Take 1 tablet (100 mg total) by mouth daily. 30 tablet 0  . cefTAZidime 1 g in dextrose 5 % 50 mL Inject 1 g into the vein every Monday, Wednesday, and Friday with hemodialysis for 20 days.    . CHOLECALCIFEROL PO Take by mouth.    . clopidogrel (PLAVIX) 75 MG tablet Take 1 tablet (75 mg total) by mouth daily with breakfast. 90 tablet 3  . dicyclomine (BENTYL) 20 MG tablet Take 1 tablet (20 mg total) by mouth 2 (two) times daily. 20 tablet 0  . ezetimibe (ZETIA) 10 MG tablet TAKE 1 TABLET BY MOUTH EVERY DAY 90 tablet 1  . hydroxychloroquine (PLAQUENIL) 200 MG tablet  Take 400 mg by mouth at bedtime.    . lidocaine-prilocaine (EMLA) cream Apply 1 application topically daily as needed (for fistula).    . loperamide (IMODIUM A-D) 2 MG tablet Take 2 mg by mouth 4 (four) times daily as needed for diarrhea or loose stools.    . megestrol (MEGACE) 40 MG/ML suspension TAKE 10 ML (400 MG TOTAL) BY MOUTH DAILY.    . methocarbamol (ROBAXIN) 500 MG tablet Take 1 tablet (500 mg total) by mouth 2 (two) times daily. 20 tablet 0  . metoprolol tartrate (LOPRESSOR) 100 MG tablet Take 1 tablet (100 mg total) by mouth 2 (two) times daily. 60 tablet 0  . midodrine (PROAMATINE) 10 MG tablet TAKE 1 TABLET BY MOUTH 3 TIMES A WEEK AS DIRECTED TAKE 1 TAB BEFORE DIALYSIS MAY REPEAT ONCE AS NEED    . multivitamin (RENA-VIT) TABS tablet Take 1 tablet by mouth daily.    Marland Kitchen oxyCODONE-acetaminophen (PERCOCET/ROXICET) 5-325 MG tablet Take 1 tablet by mouth every 4 (four) hours as needed. 30 tablet 0  . pantoprazole (PROTONIX) 40 MG tablet Take 1 tablet (40 mg total) by mouth daily. 90 tablet 3  . polyethylene glycol (MIRALAX / GLYCOLAX) 17 g packet Take 17 g by mouth daily as needed for mild constipation. 30 each 0  . polyethylene glycol powder (GLYCOLAX/MIRALAX) 17 GM/SCOOP powder Take 17 g by mouth in the morning, at noon, and at bedtime. One scoop  in beverage of your choice with each meal. You may increase if you continue to be constipated and decrease if your stool becomes too loose. 255 g 0  . rosuvastatin (CRESTOR) 40 MG tablet Take 1 tablet (40 mg total) by mouth daily at 6 PM. 90 tablet 3  . sevelamer carbonate (RENVELA) 800 MG tablet Take 3,200 mg by mouth 3 (three) times daily with meals.    . traZODone (DESYREL) 50 MG tablet TAKE 1/2 -1 TABLET (25-50 MG TOTAL) BY MOUTH AT BEDTIME AS NEEDED FOR SLEEP. 90 tablet 2  . Vancomycin (VANCOCIN) 750-5 MG/150ML-% SOLN Inject 150 mLs (750 mg total) into the vein every Monday, Wednesday, and Friday with hemodialysis for 20 days. 4000 mL   .  warfarin (COUMADIN) 1 MG tablet Take 1 tablet (1 mg total) by mouth daily.    . nitroGLYCERIN (NITROSTAT) 0.4 MG SL tablet Place 1 tablet (0.4 mg total) under the tongue every 5 (five) minutes as needed for chest pain. 25 tablet 3   No current facility-administered medications for this visit.     ROS:  See HPI  BP 93/66 (BP Location: Right Arm, Patient Position: Sitting, Cuff Size: Normal)   Pulse (!) 108   Temp 98.6 F (37 C) (Temporal)   Resp 20   Ht '5\' 10"'  (1.778 m)   SpO2 100%   BMI 22.62 kg/m   Physical Exam:  General appearance: WD, WN in NAD Respiratory:non-labored Extremities:  Right residual limb: VAC sponge was completley dried out over "Sorbac" (green) dressing and graft. The sponge was soaked with sterile saline. Monocryl tacks at lateral and medial aspects of wound were removed and Sorbac peeled carefully away from ECM. The graft appeared 99% viable. The green layer was smooth back over the graft and a thick layer of Hydrogel was applied. VAC sponge replaced with good seal.  Assessment/Plan:  This is a 65 y.o. male who is s/p: 1.  Sharp excisional debridement of right below-knee amputation wound (12 cm x 4 cm) 2.  Application of skin substitute for grafting of right below-knee amputation wound using Myriad Matrix and Myriad Morcells (12 cm x 4 cm wound, 48 cm2) 3.  Application of negative pressure wound VAC to right below-knee amputation wound  Return in one week for VAC dressing change.   Risa Grill, PA-C [facility] [phone]  Clinic MD:  Stanford Breed

## 2020-07-25 DIAGNOSIS — N186 End stage renal disease: Secondary | ICD-10-CM | POA: Diagnosis not present

## 2020-07-25 DIAGNOSIS — D509 Iron deficiency anemia, unspecified: Secondary | ICD-10-CM | POA: Diagnosis not present

## 2020-07-25 DIAGNOSIS — N2581 Secondary hyperparathyroidism of renal origin: Secondary | ICD-10-CM | POA: Diagnosis not present

## 2020-07-25 DIAGNOSIS — D631 Anemia in chronic kidney disease: Secondary | ICD-10-CM | POA: Diagnosis not present

## 2020-07-25 DIAGNOSIS — Z992 Dependence on renal dialysis: Secondary | ICD-10-CM | POA: Diagnosis not present

## 2020-07-25 DIAGNOSIS — A499 Bacterial infection, unspecified: Secondary | ICD-10-CM | POA: Diagnosis not present

## 2020-07-26 DIAGNOSIS — T8189XA Other complications of procedures, not elsewhere classified, initial encounter: Secondary | ICD-10-CM | POA: Diagnosis not present

## 2020-07-26 DIAGNOSIS — A419 Sepsis, unspecified organism: Secondary | ICD-10-CM | POA: Diagnosis not present

## 2020-07-26 DIAGNOSIS — T8789 Other complications of amputation stump: Secondary | ICD-10-CM | POA: Diagnosis not present

## 2020-07-26 DIAGNOSIS — M069 Rheumatoid arthritis, unspecified: Secondary | ICD-10-CM | POA: Diagnosis not present

## 2020-07-26 DIAGNOSIS — M1711 Unilateral primary osteoarthritis, right knee: Secondary | ICD-10-CM | POA: Diagnosis not present

## 2020-07-26 DIAGNOSIS — M009 Pyogenic arthritis, unspecified: Secondary | ICD-10-CM | POA: Diagnosis not present

## 2020-07-27 ENCOUNTER — Encounter: Payer: Self-pay | Admitting: Cardiovascular Disease

## 2020-07-27 ENCOUNTER — Telehealth: Payer: Self-pay | Admitting: *Deleted

## 2020-07-27 ENCOUNTER — Ambulatory Visit (INDEPENDENT_AMBULATORY_CARE_PROVIDER_SITE_OTHER): Payer: Medicare Other | Admitting: Internal Medicine

## 2020-07-27 DIAGNOSIS — Z992 Dependence on renal dialysis: Secondary | ICD-10-CM | POA: Diagnosis not present

## 2020-07-27 DIAGNOSIS — A499 Bacterial infection, unspecified: Secondary | ICD-10-CM | POA: Diagnosis not present

## 2020-07-27 DIAGNOSIS — Z5181 Encounter for therapeutic drug level monitoring: Secondary | ICD-10-CM | POA: Diagnosis not present

## 2020-07-27 DIAGNOSIS — D631 Anemia in chronic kidney disease: Secondary | ICD-10-CM | POA: Diagnosis not present

## 2020-07-27 DIAGNOSIS — D509 Iron deficiency anemia, unspecified: Secondary | ICD-10-CM | POA: Diagnosis not present

## 2020-07-27 DIAGNOSIS — N2581 Secondary hyperparathyroidism of renal origin: Secondary | ICD-10-CM | POA: Diagnosis not present

## 2020-07-27 DIAGNOSIS — N186 End stage renal disease: Secondary | ICD-10-CM | POA: Diagnosis not present

## 2020-07-27 LAB — POCT INR: INR: 2 (ref 2.0–3.0)

## 2020-07-27 LAB — LAB REPORT - SCANNED: INR: 2

## 2020-07-27 NOTE — Chronic Care Management (AMB) (Signed)
  Chronic Care Management   Note  07/27/2020 Name: [name] MRN: [id number] DOB: 1955-05-21  Jeremy [name] is a 65 y.o. year old male who is a primary care patient of Caryl Bis, Angela Adam, MD. I reached out to Elicia Lamp by phone today in response to a referral sent by [name]'s PCP, Leone Haven, MD     [name] was given information about Chronic Care Management services today including:  1. CCM service includes personalized support from designated clinical staff supervised by his physician, including individualized plan of care and coordination with other care providers 2. 24/7 contact phone numbers for assistance for urgent and routine care needs. 3. Service will only be billed when office clinical staff spend 20 minutes or more in a month to coordinate care. 4. Only one practitioner may furnish and bill the service in a calendar month. 5. The patient may stop CCM services at any time (effective at the end of the month) by phone call to the office staff. 6. The patient will be responsible for cost sharing (co-pay) of up to 20% of the service fee (after annual deductible is met).  Patient agreed to services and verbal consent obtained.   Follow up plan: Telephone appointment with care management team member scheduled for:08/03/2020  Harmony Sandell  Care Guide, Embedded Care Coordination [place]  Care Management

## 2020-07-27 NOTE — Patient Instructions (Signed)
Description    Called and spoke to pt and instructed him to take 2 tablets of warfarin today and then continue to take normal dose of warfarin 1 tablet daily. Pt will recheck INR on 4/27. [facility] [phone].   "Due to hx of mechanical AVR would require bridging with Lovenox should need to stop anticoagulation arise." per OV note from 07/12/2020

## 2020-07-30 DIAGNOSIS — Z992 Dependence on renal dialysis: Secondary | ICD-10-CM | POA: Diagnosis not present

## 2020-07-30 DIAGNOSIS — D631 Anemia in chronic kidney disease: Secondary | ICD-10-CM | POA: Diagnosis not present

## 2020-07-30 DIAGNOSIS — N2581 Secondary hyperparathyroidism of renal origin: Secondary | ICD-10-CM | POA: Diagnosis not present

## 2020-07-30 DIAGNOSIS — D509 Iron deficiency anemia, unspecified: Secondary | ICD-10-CM | POA: Diagnosis not present

## 2020-07-30 DIAGNOSIS — A499 Bacterial infection, unspecified: Secondary | ICD-10-CM | POA: Diagnosis not present

## 2020-07-30 DIAGNOSIS — N186 End stage renal disease: Secondary | ICD-10-CM | POA: Diagnosis not present

## 2020-07-31 ENCOUNTER — Other Ambulatory Visit: Payer: Self-pay

## 2020-07-31 ENCOUNTER — Telehealth: Payer: Self-pay

## 2020-07-31 ENCOUNTER — Encounter: Payer: Self-pay | Admitting: Physician Assistant

## 2020-07-31 ENCOUNTER — Ambulatory Visit (INDEPENDENT_AMBULATORY_CARE_PROVIDER_SITE_OTHER): Payer: Medicare Other | Admitting: Physician Assistant

## 2020-07-31 VITALS — BP 110/75 | HR 102 | Temp 97.6°F | Resp 20 | Ht 70.0 in | Wt 157.6 lb

## 2020-07-31 DIAGNOSIS — S88111A Complete traumatic amputation at level between knee and ankle, right lower leg, initial encounter: Secondary | ICD-10-CM

## 2020-07-31 DIAGNOSIS — M009 Pyogenic arthritis, unspecified: Secondary | ICD-10-CM | POA: Diagnosis not present

## 2020-07-31 DIAGNOSIS — I739 Peripheral vascular disease, unspecified: Secondary | ICD-10-CM

## 2020-07-31 DIAGNOSIS — T8189XD Other complications of procedures, not elsewhere classified, subsequent encounter: Secondary | ICD-10-CM

## 2020-07-31 DIAGNOSIS — T8789 Other complications of amputation stump: Secondary | ICD-10-CM | POA: Diagnosis not present

## 2020-07-31 DIAGNOSIS — M069 Rheumatoid arthritis, unspecified: Secondary | ICD-10-CM | POA: Diagnosis not present

## 2020-07-31 DIAGNOSIS — M1711 Unilateral primary osteoarthritis, right knee: Secondary | ICD-10-CM | POA: Diagnosis not present

## 2020-07-31 DIAGNOSIS — A419 Sepsis, unspecified organism: Secondary | ICD-10-CM | POA: Diagnosis not present

## 2020-07-31 DIAGNOSIS — T8189XA Other complications of procedures, not elsewhere classified, initial encounter: Secondary | ICD-10-CM | POA: Diagnosis not present

## 2020-07-31 NOTE — Telephone Encounter (Signed)
Surgical wound is very shallow, but wound is not closed - per pictured wound - estimated 0.5 cm and gave depth to [employer] rep

## 2020-07-31 NOTE — Progress Notes (Signed)
Office Note     CC:  follow up Requesting Provider:  Leone Haven, MD  HPI: Jeremy [name] is a 65 y.o. (1955-10-05) male who presents for weekly VAC dressing change. He is  s/p  1. Sharp excisional debridement of right below-knee amputation wound(12 cm x 4 cm) 2. Application of skin substitute for grafting ofright below-knee amputation wound usingMyriadMatrix andMyriad Morcells(12 cm x 4 cm wound, 48 cm2) 3. Application of negative pressure wound VAC to right below-knee amputation wound  History of multiple percutaneous vascular interventions. He ultimately required below knee amputation for non healing wounds. He subsequently had issues with nonhealing BKA stump and underwent debridement and application of skin substitute on 07/13/20 by Dr. Carlis Abbott. He presents today for his weekly wound evaluation and VAC change. No pain. No fever or chills  Past Medical History:  Diagnosis Date  . Abnormal stress test 03/19/2018  . 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 Anxiety    situation  . Aortic stenosis    a. 2014 s/p mech AVR ([facility])-->chronic coumadin; b. 08/2018 Echo: Mild AS/AI. Nl fxn/gradient.  . Arthritis   . Atrial fibrillation ([place])   . CAD (coronary artery disease)    a. 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 02/2018 MV ([facility]: large inf, infsept, inflat infarct w/ min rev, EF 30%; c. 06/2018 Cath: LM min irregs, LAD 60p, 95/30/64m, LCX min irregs, RCA 40p, 76m90d, RPAV 90, RPDA 95ost; d. 09/2018 CABGx3 (LIMA->LAD, VG->D1, VG->PDA).  . Chronic anticoagulation 06/2012   a. Coumadin in the setting of mech AVR and afib.  . Complication of anesthesia   . COVID-19 03/15/2019  . CPAP (continuous positive airway pressure) dependence   [facility]   Patient started HD around 1996,  had 1st transplant LLQ around 1998 until 2007 when they found renal cell cancer in transplant and both native  kidneys, all were removed > went back on HD until 2nd transplant RLQ in 2014 which lasted 3 yrs; it was embolized for suspected acute rejection. [place] [facility] [place] [place]  . ESRD (end stage renal disease) ([place]   a. 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 1999 s/p Kidney transplant; c. 2014 Bilat nephrectomy (transplanted kidney resected) in the setting of renal cell carcinoma; d. PD until 11/2017-->HD.  . Marland Kitcheneart murmur   . HFrEF (heart failure with reduced ejection fraction) ([place] [place]   a. 07/2016 Echo: EF 55-60%; b. 02/2018 MV: EF 30%; c. 04/2018 Echo: EF 35%; d. 08/2018 Echo: EF 30-35%. Glob HK w/o rwma. Mildly reduced RV fxn. Mod to sev dil LA. Nl AoV fxn.  . Marland Kitchenistory of blood transfusion   . Hx of colonoscopy    2009 by Dr. ReLaural GoldenNormal except for small submucosal lipoma. No evidence of polyps or colitis.  . Hyperlipidemia   . Hypertension   . Hypogonadism in male 07/25/2015  . Ischemic cardiomyopathy    a. 07/2016 Echo: EF 55-60%; b. 02/2018 MV: EF 30%; c. 04/2018 Echo: EF 35%; d. 08/2018 Echo: EF 30-35%.  . Myocardial infarction ([facility]  . OSA (obstructive sleep apnea)    No CPAP  . Peripheral vascular disease ([facility]  . Permanent atrial fibrillation (HCC)    a. CHA2DS2VASc = 3-->Coumadin (also mech AVR); b. 06/2018 Amio started 2/2 elevated rates.  . Marland KitchenONV (postoperative nausea and vomiting)   .  Renal cell cancer ([place])   . Renal cell carcinoma ([place]) 08/04/2016    Past Surgical History:  Procedure Laterality Date  . ABDOMINAL AORTOGRAM W/LOWER EXTREMITY N/A 12/22/2019   Procedure: ABDOMINAL AORTOGRAM W/LOWER EXTREMITY;  Surgeon: Marty Heck, MD;  Location: [place] CV LAB;  Service: Cardiovascular;  Laterality: N/A;  . AMPUTATION Right 04/14/2020   Procedure: RIGHT BELOW KNEE AMPUTATION;  Surgeon: Marty Heck, MD;  Location: [place];  Service: Vascular;  Laterality: Right;  . AORTIC VALVE REPLACEMENT  3 /11 /2014   At [place] Right 07/13/2020   Procedure: APPLICATION OF WOUND VAC;  Surgeon: Marty Heck, MD;  Location: [place];  Service: Vascular;  Laterality: Right;  . BACK SURGERY    . CAPD INSERTION N/A 02/19/2017   Procedure: LAPAROSCOPIC INSERTION CONTINUOUS AMBULATORY PERITONEAL DIALYSIS  (CAPD) CATHETER;  Surgeon: Algernon Huxley, MD;  Location: [facility];  Service: General;  Laterality: N/A;  . CORONARY ANGIOPLASTY WITH STENT PLACEMENT    . CORONARY ARTERY BYPASS GRAFT N/A 09/16/2018   Procedure: CORONARY ARTERY BYPASS GRAFT times three      with left internal mammary artery and endoharvest of right leg greater saphenous vein;  Surgeon: Melrose Nakayama, MD;  Location: [place];  Service: Open Heart Surgery;  Laterality: N/A;  . CORONARY STENT INTERVENTION N/A 07/15/2019   Procedure: CORONARY STENT INTERVENTION;  Surgeon: Wellington Hampshire, MD;  Location: [place] CV LAB;  Service: Cardiovascular;  Laterality: N/A;  . DG AV DIALYSIS  SHUNT ACCESS EXIST*L* OR     left arm  . EPIGASTRIC HERNIA REPAIR N/A 02/19/2017   Procedure: HERNIA REPAIR EPIGASTRIC ADULT;  Surgeon: Robert Bellow, MD;  Location: [facility];  Service: General;  Laterality: N/A;  . EYE SURGERY     bilateral cataract  . HERNIA REPAIR     UMBILICAL HERNIA  . HERNIA REPAIR  02/19/2017  . INNER EAR SURGERY     20 years ago  . KIDNEY TRANSPLANT    . KNEE ARTHROSCOPY WITH MEDIAL MENISECTOMY Right 07/02/2020   Procedure: KNEE ARTHROSCOPY WITH INCISION AND DRAINAGE;  Surgeon: Leim Fabry, MD;  Location: [facility];  Service: Orthopedics;  Laterality: Right;  . LEFT HEART CATH AND CORS/GRAFTS ANGIOGRAPHY N/A 07/15/2019   Procedure: LEFT HEART CATH AND CORS/GRAFTS ANGIOGRAPHY;  Surgeon: Wellington Hampshire, MD;  Location: [place] [facility];  Service: Cardiovascular;  Laterality: N/A;  . LOWER EXTREMITY ANGIOGRAPHY Right 08/25/2019   Procedure: LOWER EXTREMITY ANGIOGRAPHY;  Surgeon: Algernon Huxley, MD;   Location: [place] [facility];  Service: Cardiovascular;  Laterality: Right;  . LOWER EXTREMITY ANGIOGRAPHY Left 09/29/2019   Procedure: Lower Extremity Angiography;  Surgeon: Algernon Huxley, MD;  Location: [place] CV LAB;  Service: Cardiovascular;  Laterality: Left;  . PERIPHERAL VASCULAR INTERVENTION Right 12/22/2019   Procedure: PERIPHERAL VASCULAR INTERVENTION;  Surgeon: Marty Heck, MD;  Location: [place] [facility] LAB;  Service: Cardiovascular;  Laterality: Right;  . Peritoneal Dialysis access placement  02/19/2017  . PSEUDOANERYSM COMPRESSION N/A 09/29/2019   Procedure: PSEUDOANERYSM COMPRESSION;  Surgeon: Algernon Huxley, MD;  Location: [place] CV LAB;  Service: Cardiovascular;  Laterality: N/A;  . REMOVAL OF A DIALYSIS CATHETER N/A 11/23/2017   Procedure: REMOVAL OF A PERITONEAL DIALYSIS CATHETER;  Surgeon: Algernon Huxley, MD;  Location: [facility];  Service: Vascular;  Laterality: N/A;  . RIGHT/LEFT HEART CATH AND CORONARY ANGIOGRAPHY Bilateral 06/07/2018  Procedure: RIGHT/LEFT HEART CATH AND CORONARY ANGIOGRAPHY;  Surgeon: Wellington Hampshire, MD;  Location: [place] CV [facility];  Service: Cardiovascular;  Laterality: Bilateral;  . TEE WITHOUT CARDIOVERSION N/A 07/21/2016   Procedure: TRANSESOPHAGEAL ECHOCARDIOGRAM (TEE);  Surgeon: Wellington Hampshire, MD;  Location: [facility];  Service: Cardiovascular;  Laterality: N/A;  . TEE WITHOUT CARDIOVERSION N/A 09/16/2018   Procedure: TRANSESOPHAGEAL ECHOCARDIOGRAM (TEE);  Surgeon: Melrose Nakayama, MD;  Location: [place];  Service: Open Heart Surgery;  Laterality: N/A;  . WOUND DEBRIDEMENT Right 07/13/2020   Procedure: RIGHT BELOW KNEE AMPUTATION DEBRIDEMENT WITH MYRIAD SKIN SUBSTITUTE;  Surgeon: Marty Heck, MD;  Location: [facility] OR;  Service: Vascular;  Laterality: Right;    Social History   Socioeconomic History  . Marital status: Divorced    Spouse name: Not on file  . Number of children: 1  . Years of education: 88  . Highest  education level: High school graduate  Occupational History  . Occupation: retired Forensic scientist -Disabled    Comment: Psychologist, educational  Tobacco Use  . Smoking status: Never Smoker  . Smokeless tobacco: Never Used  Vaping Use  . Vaping Use: Never used  Substance and Sexual Activity  . Alcohol use: Not Currently  . Drug use: No  . Sexual activity: Not Currently    Partners: Female  Other Topics Concern  . Not on file  Social History Narrative   Lives at home with girlfriend   1 daughter 34    Right handed    Social Determinants of Health   Financial Resource Strain: Not on file  Food Insecurity: Not on file  Transportation Needs: Not on file  Physical Activity: Not on file  Stress: Not on file  Social Connections: Not on file  Intimate Partner Violence: Not on file    Family History  Problem Relation Age of Onset  . Cancer Mother        pancreatic  . Heart disease Father   . Diabetes Father     Current Outpatient Medications  Medication Sig Dispense Refill  . acetaminophen (TYLENOL) 500 MG tablet Take 1 tablet (500 mg total) by mouth every 4 (four) hours as needed. 30 tablet 0  . allopurinol (ZYLOPRIM) 100 MG tablet Take 1 tablet (100 mg total) by mouth daily. 30 tablet 0  . CHOLECALCIFEROL PO Take by mouth.    . clopidogrel (PLAVIX) 75 MG tablet Take 1 tablet (75 mg total) by mouth daily with breakfast. 90 tablet 3  . dicyclomine (BENTYL) 20 MG tablet Take 1 tablet (20 mg total) by mouth 2 (two) times daily. 20 tablet 0  . ezetimibe (ZETIA) 10 MG tablet TAKE 1 TABLET BY MOUTH EVERY DAY 90 tablet 1  . hydroxychloroquine (PLAQUENIL) 200 MG tablet Take 400 mg by mouth at bedtime.    . lidocaine-prilocaine (EMLA) cream Apply 1 application topically daily as needed (for fistula).    . loperamide (IMODIUM A-D) 2 MG tablet Take 2 mg by mouth 4 (four) times daily as needed for diarrhea or loose stools.    . megestrol (MEGACE) 40 MG/ML suspension TAKE 10 ML (400 MG TOTAL) BY MOUTH  DAILY.    . methocarbamol (ROBAXIN) 500 MG tablet Take 1 tablet (500 mg total) by mouth 2 (two) times daily. 20 tablet 0  . metoprolol tartrate (LOPRESSOR) 100 MG tablet Take 1 tablet (100 mg total) by mouth 2 (two) times daily. 60 tablet 0  . midodrine (PROAMATINE) 10 MG tablet TAKE 1 TABLET BY MOUTH  3 TIMES A WEEK AS DIRECTED TAKE 1 TAB BEFORE DIALYSIS MAY REPEAT ONCE AS NEED    . multivitamin (RENA-VIT) TABS tablet Take 1 tablet by mouth daily.    Marland Kitchen oxyCODONE-acetaminophen (PERCOCET/ROXICET) 5-325 MG tablet Take 1 tablet by mouth every 4 (four) hours as needed. 30 tablet 0  . pantoprazole (PROTONIX) 40 MG tablet Take 1 tablet (40 mg total) by mouth daily. 90 tablet 3  . polyethylene glycol (MIRALAX / GLYCOLAX) 17 g packet Take 17 g by mouth daily as needed for mild constipation. 30 each 0  . polyethylene glycol powder (GLYCOLAX/MIRALAX) 17 GM/SCOOP powder Take 17 g by mouth in the morning, at noon, and at bedtime. One scoop in beverage of your choice with each meal. You may increase if you continue to be constipated and decrease if your stool becomes too loose. 255 g 0  . rosuvastatin (CRESTOR) 40 MG tablet Take 1 tablet (40 mg total) by mouth daily at 6 PM. 90 tablet 3  . sevelamer carbonate (RENVELA) 800 MG tablet Take 3,200 mg by mouth 3 (three) times daily with meals.    . traZODone (DESYREL) 50 MG tablet TAKE 1/2 -1 TABLET (25-50 MG TOTAL) BY MOUTH AT BEDTIME AS NEEDED FOR SLEEP. 90 tablet 2  . warfarin (COUMADIN) 1 MG tablet Take 1 tablet (1 mg total) by mouth daily.    . nitroGLYCERIN (NITROSTAT) 0.4 MG SL tablet Place 1 tablet (0.4 mg total) under the tongue every 5 (five) minutes as needed for chest pain. 25 tablet 3   No current facility-administered medications for this visit.    Allergies  Allergen Reactions  . Statins Other (See Comments)    Arthralgias (intolerance), Myalgias (intolerance)  Pt taking pravastatin   . Gabapentin     Drowsy   . Sertraline Rash     REVIEW  OF SYSTEMS:  '[X]'  denotes positive finding, '[ ]'  denotes negative finding Cardiac  Comments:  Chest pain or chest pressure:    Shortness of breath upon exertion:    Short of breath when lying flat:    Irregular heart rhythm:        Vascular    Pain in calf, thigh, or hip brought on by ambulation:    Pain in feet at night that wakes you up from your sleep:     Blood clot in your veins:    Leg swelling:         Pulmonary    Oxygen at home:    Productive cough:     Wheezing:         Neurologic    Sudden weakness in arms or legs:     Sudden numbness in arms or legs:     Sudden onset of difficulty speaking or slurred speech:    Temporary loss of vision in one eye:     Problems with dizziness:         Gastrointestinal    Blood in stool:     Vomited blood:         Genitourinary    Burning when urinating:     Blood in urine:        Psychiatric    Major depression:         Hematologic    Bleeding problems:    Problems with blood clotting too easily:        Skin    Rashes or ulcers:        Constitutional    Fever or chills:  PHYSICAL EXAMINATION:  Vitals:   07/31/20 1323  BP: 110/75  Pulse: (!) 102  Resp: 20  Temp: 97.6 F (36.4 C)  TempSrc: Temporal  SpO2: 100%  Weight: 157 lb 10.1 oz (71.5 kg)  Height: '5\' 10"'  (1.778 m)    General:  WDWN in NAD; vital signs documented above Extremities:     "Sorbac" green dressing very dry and adherent to both VAC sponge and graft. Soaked with sterile saline. Sorbac was carefully peeled away from graft and removed. Monocryl tacks removed. Graft appears mostly viable. Hydrogel was applied to graft and Adaptic was applied to the wound. Wet to dry dressings then applied followed by Kerlix wrap.  Wound Measurements: 12 cm x 3.5 cm wound  Musculoskeletal: no muscle wasting or atrophy  Neurologic: A&O X 3;  No focal weakness or paresthesias are detected Psychiatric:  The pt has Normal affect.   ASSESSMENT/PLAN:: 65 y.o.  male here for follow up for weekly wound VAC changes 1. Sharp excisional debridement of right below-knee amputation wound(12 cm x 4 cm) 2. Application of skin substitute for grafting ofright below-knee amputation wound usingMyriadMatrix andMyriad Morcells(12 cm x 4 cm wound, 48 cm2) 3. Application of negative pressure wound VAC to right below-knee amputation wound - Wound VAC had zero output and wound appeared very dry so it was felt best to continue wet to dry dressings until next visit instead of reapplying wound VAC - Daily wet to dry dressing changes with application of hydrogel or KY jelly - He will follow up in one week for wound check  [name], PA-C [facility] [phone]  Clinic MD:   Roxanne Mins

## 2020-07-31 NOTE — Telephone Encounter (Signed)
Called [facility] to give wound measurements and tell them that for at least this week wound vac had been removed. Only measurements were 3 cm by 12 cm. Was advised if we did not give a depth the insurance would deny claim because that meant it was a closed wound. Gave wound depth of 0.5 cm.

## 2020-07-31 NOTE — Telephone Encounter (Signed)
Advised we need to cancel appt at [facility] ID as Dr Delaine Lame is out of country. I explained [place] DR Fitzgerald's office has called several times and asked that he remain by phone today. I also called Brandy at that office and explained that I advised patient she will be calling to set up appointment.

## 2020-08-01 DIAGNOSIS — A499 Bacterial infection, unspecified: Secondary | ICD-10-CM | POA: Diagnosis not present

## 2020-08-01 DIAGNOSIS — D509 Iron deficiency anemia, unspecified: Secondary | ICD-10-CM | POA: Diagnosis not present

## 2020-08-01 DIAGNOSIS — D631 Anemia in chronic kidney disease: Secondary | ICD-10-CM | POA: Diagnosis not present

## 2020-08-01 DIAGNOSIS — N186 End stage renal disease: Secondary | ICD-10-CM | POA: Diagnosis not present

## 2020-08-01 DIAGNOSIS — N2581 Secondary hyperparathyroidism of renal origin: Secondary | ICD-10-CM | POA: Diagnosis not present

## 2020-08-01 DIAGNOSIS — Z992 Dependence on renal dialysis: Secondary | ICD-10-CM | POA: Diagnosis not present

## 2020-08-02 ENCOUNTER — Inpatient Hospital Stay: Payer: Medicare Other | Admitting: Infectious Diseases

## 2020-08-02 DIAGNOSIS — M009 Pyogenic arthritis, unspecified: Secondary | ICD-10-CM | POA: Diagnosis not present

## 2020-08-02 DIAGNOSIS — A419 Sepsis, unspecified organism: Secondary | ICD-10-CM | POA: Diagnosis not present

## 2020-08-02 DIAGNOSIS — M069 Rheumatoid arthritis, unspecified: Secondary | ICD-10-CM | POA: Diagnosis not present

## 2020-08-02 DIAGNOSIS — M1711 Unilateral primary osteoarthritis, right knee: Secondary | ICD-10-CM | POA: Diagnosis not present

## 2020-08-02 DIAGNOSIS — T8189XA Other complications of procedures, not elsewhere classified, initial encounter: Secondary | ICD-10-CM | POA: Diagnosis not present

## 2020-08-02 DIAGNOSIS — T8789 Other complications of amputation stump: Secondary | ICD-10-CM | POA: Diagnosis not present

## 2020-08-02 LAB — POCT INR: INR: 2.2 (ref 2.0–3.0)

## 2020-08-03 ENCOUNTER — Ambulatory Visit (INDEPENDENT_AMBULATORY_CARE_PROVIDER_SITE_OTHER): Payer: Medicare Other | Admitting: Cardiovascular Disease

## 2020-08-03 ENCOUNTER — Encounter: Payer: Self-pay | Admitting: Cardiovascular Disease

## 2020-08-03 ENCOUNTER — Ambulatory Visit: Payer: Medicare Other | Admitting: *Deleted

## 2020-08-03 DIAGNOSIS — R6520 Severe sepsis without septic shock: Secondary | ICD-10-CM

## 2020-08-03 DIAGNOSIS — N2581 Secondary hyperparathyroidism of renal origin: Secondary | ICD-10-CM | POA: Diagnosis not present

## 2020-08-03 DIAGNOSIS — Z5181 Encounter for therapeutic drug level monitoring: Secondary | ICD-10-CM | POA: Diagnosis not present

## 2020-08-03 DIAGNOSIS — N186 End stage renal disease: Secondary | ICD-10-CM

## 2020-08-03 DIAGNOSIS — Z952 Presence of prosthetic heart valve: Secondary | ICD-10-CM

## 2020-08-03 DIAGNOSIS — D509 Iron deficiency anemia, unspecified: Secondary | ICD-10-CM | POA: Diagnosis not present

## 2020-08-03 DIAGNOSIS — A499 Bacterial infection, unspecified: Secondary | ICD-10-CM | POA: Diagnosis not present

## 2020-08-03 DIAGNOSIS — D631 Anemia in chronic kidney disease: Secondary | ICD-10-CM | POA: Diagnosis not present

## 2020-08-03 DIAGNOSIS — I4891 Unspecified atrial fibrillation: Secondary | ICD-10-CM

## 2020-08-03 DIAGNOSIS — S88112A Complete traumatic amputation at level between knee and ankle, left lower leg, initial encounter: Secondary | ICD-10-CM

## 2020-08-03 DIAGNOSIS — Z992 Dependence on renal dialysis: Secondary | ICD-10-CM | POA: Diagnosis not present

## 2020-08-03 DIAGNOSIS — I739 Peripheral vascular disease, unspecified: Secondary | ICD-10-CM

## 2020-08-03 NOTE — Telephone Encounter (Signed)
This encounter was created in error - please disregard.

## 2020-08-03 NOTE — Patient Instructions (Signed)
Description   Called and spoke to pt instructed him to take 2 tablets on warfarin today and then to START taking 1 tablet daily except for 2 tablets on Fridays. Recheck INR on 5/4. Coumadin Clinic [phone].   "Due to hx of mechanical AVR would require bridging with Lovenox should need to stop anticoagulation arise." per OV note from 07/12/2020

## 2020-08-03 NOTE — Chronic Care Management (AMB) (Signed)
  Chronic Care Management   Outreach Note  08/03/2020 Name: Jeremy [name] MRN: [id number] DOB: 1955/09/03  Referred by: Leone Haven, MD Reason for referral : Chronic Care Management (Initial, nonhealing wound)   Successful contact was made with patient to discuss care management and care coordination services.  Patient agreeable to CCM services.  Just returned home from hemodialysis and is "wiped out".  Requesting another call on another day and time.  Follow Up Plan: The care management team will reach out to the patient again over the next 20 business days per patient request.   Hubert Azure RN, MSN RN Care Management Coordinator [place] [phone] [email or web address]

## 2020-08-04 DIAGNOSIS — Z992 Dependence on renal dialysis: Secondary | ICD-10-CM | POA: Diagnosis not present

## 2020-08-04 DIAGNOSIS — T8610 Unspecified complication of kidney transplant: Secondary | ICD-10-CM | POA: Diagnosis not present

## 2020-08-04 DIAGNOSIS — N186 End stage renal disease: Secondary | ICD-10-CM | POA: Diagnosis not present

## 2020-08-05 DIAGNOSIS — N186 End stage renal disease: Secondary | ICD-10-CM | POA: Diagnosis not present

## 2020-08-05 DIAGNOSIS — Z992 Dependence on renal dialysis: Secondary | ICD-10-CM | POA: Diagnosis not present

## 2020-08-05 DIAGNOSIS — T8610 Unspecified complication of kidney transplant: Secondary | ICD-10-CM | POA: Diagnosis not present

## 2020-08-06 DIAGNOSIS — D509 Iron deficiency anemia, unspecified: Secondary | ICD-10-CM | POA: Diagnosis not present

## 2020-08-06 DIAGNOSIS — N186 End stage renal disease: Secondary | ICD-10-CM | POA: Diagnosis not present

## 2020-08-06 DIAGNOSIS — N2581 Secondary hyperparathyroidism of renal origin: Secondary | ICD-10-CM | POA: Diagnosis not present

## 2020-08-06 DIAGNOSIS — Z992 Dependence on renal dialysis: Secondary | ICD-10-CM | POA: Diagnosis not present

## 2020-08-06 DIAGNOSIS — T8610 Unspecified complication of kidney transplant: Secondary | ICD-10-CM | POA: Diagnosis not present

## 2020-08-06 DIAGNOSIS — D631 Anemia in chronic kidney disease: Secondary | ICD-10-CM | POA: Diagnosis not present

## 2020-08-07 ENCOUNTER — Ambulatory Visit (INDEPENDENT_AMBULATORY_CARE_PROVIDER_SITE_OTHER): Payer: Medicare Other | Admitting: Physician Assistant

## 2020-08-07 ENCOUNTER — Other Ambulatory Visit: Payer: Self-pay

## 2020-08-07 VITALS — BP 104/70 | HR 114 | Temp 98.6°F | Resp 20 | Wt 155.0 lb

## 2020-08-07 DIAGNOSIS — T8189XA Other complications of procedures, not elsewhere classified, initial encounter: Secondary | ICD-10-CM | POA: Diagnosis not present

## 2020-08-07 DIAGNOSIS — M069 Rheumatoid arthritis, unspecified: Secondary | ICD-10-CM | POA: Diagnosis not present

## 2020-08-07 DIAGNOSIS — A419 Sepsis, unspecified organism: Secondary | ICD-10-CM | POA: Diagnosis not present

## 2020-08-07 DIAGNOSIS — T8789 Other complications of amputation stump: Secondary | ICD-10-CM | POA: Diagnosis not present

## 2020-08-07 DIAGNOSIS — Z992 Dependence on renal dialysis: Secondary | ICD-10-CM | POA: Diagnosis not present

## 2020-08-07 DIAGNOSIS — N186 End stage renal disease: Secondary | ICD-10-CM | POA: Diagnosis not present

## 2020-08-07 DIAGNOSIS — M009 Pyogenic arthritis, unspecified: Secondary | ICD-10-CM | POA: Diagnosis not present

## 2020-08-07 DIAGNOSIS — T8189XD Other complications of procedures, not elsewhere classified, subsequent encounter: Secondary | ICD-10-CM

## 2020-08-07 DIAGNOSIS — M1711 Unilateral primary osteoarthritis, right knee: Secondary | ICD-10-CM | POA: Diagnosis not present

## 2020-08-07 DIAGNOSIS — T8610 Unspecified complication of kidney transplant: Secondary | ICD-10-CM | POA: Diagnosis not present

## 2020-08-07 MED ORDER — HYDROGEL GEL: 1 refills | Status: AC

## 2020-08-07 NOTE — Progress Notes (Signed)
POST OPERATIVE OFFICE NOTE    CC:  F/u for surgery  HPI:  This is a 65 y.o. male who is s/p 1. Sharp excisional debridement of right below-knee amputation wound(12 cm x 4 cm) 2. Application of skin substitute for grafting ofright below-knee amputation wound usingMyriadMatrix andMyriad Morcells(12 cm x 4 cm wound, 48 cm2)  Patient is a 65 year old male who has complex history of peripheral vascular disease.He hasundergone multiple tibial interventions in the right leg for nonhealing wound including retrograde tibial interventions. 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 He has had a nonhealing BKA stump and underwent debridement and application of skin substitute on 07/13/2020 by Dr. Carlis Abbott.  Has history of left lower extremity stents.  His wound VAC was discontinued 1 week ago and his wife has been doing dressing changes.  Adaptic has been placed over the skin graft.  Allergies  Allergen Reactions  . Statins Other (See Comments)    Arthralgias (intolerance), Myalgias (intolerance)  Pt taking pravastatin   . Gabapentin     Drowsy   . Sertraline Rash    Current Outpatient Medications  Medication Sig Dispense Refill  . acetaminophen (TYLENOL) 500 MG tablet Take 1 tablet (500 mg total) by mouth every 4 (four) hours as needed. 30 tablet 0  . allopurinol (ZYLOPRIM) 100 MG tablet Take 1 tablet (100 mg total) by mouth daily. 30 tablet 0  . CHOLECALCIFEROL PO Take by mouth.    . clopidogrel (PLAVIX) 75 MG tablet Take 1 tablet (75 mg total) by mouth daily with breakfast. 90 tablet 3  . dicyclomine (BENTYL) 20 MG tablet Take 1 tablet (20 mg total) by mouth 2 (two) times daily. 20 tablet 0  . ezetimibe (ZETIA) 10 MG tablet TAKE 1 TABLET BY MOUTH EVERY DAY 90 tablet 1  . hydroxychloroquine (PLAQUENIL) 200 MG tablet Take 400 mg by mouth at bedtime.    . lidocaine-prilocaine (EMLA) cream Apply 1 application topically daily as needed (for fistula).    .  loperamide (IMODIUM A-D) 2 MG tablet Take 2 mg by mouth 4 (four) times daily as needed for diarrhea or loose stools.    . megestrol (MEGACE) 40 MG/ML suspension TAKE 10 ML (400 MG TOTAL) BY MOUTH DAILY.    . methocarbamol (ROBAXIN) 500 MG tablet Take 1 tablet (500 mg total) by mouth 2 (two) times daily. 20 tablet 0  . metoprolol tartrate (LOPRESSOR) 100 MG tablet Take 1 tablet (100 mg total) by mouth 2 (two) times daily. 60 tablet 0  . midodrine (PROAMATINE) 10 MG tablet TAKE 1 TABLET BY MOUTH 3 TIMES A WEEK AS DIRECTED TAKE 1 TAB BEFORE DIALYSIS MAY REPEAT ONCE AS NEED    . multivitamin (RENA-VIT) TABS tablet Take 1 tablet by mouth daily.    . nitroGLYCERIN (NITROSTAT) 0.4 MG SL tablet Place 1 tablet (0.4 mg total) under the tongue every 5 (five) minutes as needed for chest pain. 25 tablet 3  . oxyCODONE-acetaminophen (PERCOCET/ROXICET) 5-325 MG tablet Take 1 tablet by mouth every 4 (four) hours as needed. 30 tablet 0  . pantoprazole (PROTONIX) 40 MG tablet Take 1 tablet (40 mg total) by mouth daily. 90 tablet 3  . polyethylene glycol (MIRALAX / GLYCOLAX) 17 g packet Take 17 g by mouth daily as needed for mild constipation. 30 each 0  . polyethylene glycol powder (GLYCOLAX/MIRALAX) 17 GM/SCOOP powder Take 17 g by mouth in the morning, at noon, and at bedtime. One scoop in beverage of your choice with each meal. You may  increase if you continue to be constipated and decrease if your stool becomes too loose. 255 g 0  . rosuvastatin (CRESTOR) 40 MG tablet Take 1 tablet (40 mg total) by mouth daily at 6 PM. 90 tablet 3  . sevelamer carbonate (RENVELA) 800 MG tablet Take 3,200 mg by mouth 3 (three) times daily with meals.    . traZODone (DESYREL) 50 MG tablet TAKE 1/2 -1 TABLET (25-50 MG TOTAL) BY MOUTH AT BEDTIME AS NEEDED FOR SLEEP. 90 tablet 2  . warfarin (COUMADIN) 1 MG tablet Take 1 tablet (1 mg total) by mouth daily.     No current facility-administered medications for this visit.     ROS:  See  HPI  There were no vitals taken for this visit.  Physical Exam:  General appearance: In no apparent distress Cardiac: Rate and rhythm are regular Respiratory: Nonlabored Extremities: Right residual limb: Dressings removed.  Myriad graft in place.  There is no moisture to the wound.  Left foot inspected: Mild edema.  Motor function and sensation intact.  Foot is warm with mild rubor of the forefoot.  No tissue loss. Neuro: Alert and oriented x4.      Assessment/Plan:  This is a 65 y.o. male who is s/p: 1. Sharp excisional debridement of right below-knee amputation wound(12 cm x 4 cm) 2. Application of skin substitute for grafting ofright below-knee amputation wound usingMyriadMatrix andMyriad Morcells(12 cm x 4 cm wound, 48 cm2)  Dr. Carlis Abbott examined the patient.  The graft is intact but dry.  Hydrogel and Adaptic applied with dry dressing over top.  Directed his wife to do these dressing changes daily.  Recheck in 2 weeks.  Risa Grill, PA-C [facility] [phone]  Clinic MD: Carlis Abbott

## 2020-08-08 ENCOUNTER — Telehealth: Payer: Self-pay

## 2020-08-08 ENCOUNTER — Ambulatory Visit (INDEPENDENT_AMBULATORY_CARE_PROVIDER_SITE_OTHER): Payer: Medicare Other

## 2020-08-08 ENCOUNTER — Encounter: Payer: Self-pay | Admitting: Cardiovascular Disease

## 2020-08-08 DIAGNOSIS — N2581 Secondary hyperparathyroidism of renal origin: Secondary | ICD-10-CM | POA: Diagnosis not present

## 2020-08-08 DIAGNOSIS — I4821 Permanent atrial fibrillation: Secondary | ICD-10-CM | POA: Diagnosis not present

## 2020-08-08 DIAGNOSIS — Z952 Presence of prosthetic heart valve: Secondary | ICD-10-CM

## 2020-08-08 DIAGNOSIS — Z992 Dependence on renal dialysis: Secondary | ICD-10-CM | POA: Diagnosis not present

## 2020-08-08 DIAGNOSIS — I4811 Longstanding persistent atrial fibrillation: Secondary | ICD-10-CM | POA: Diagnosis not present

## 2020-08-08 DIAGNOSIS — T8610 Unspecified complication of kidney transplant: Secondary | ICD-10-CM | POA: Diagnosis not present

## 2020-08-08 DIAGNOSIS — Z5181 Encounter for therapeutic drug level monitoring: Secondary | ICD-10-CM

## 2020-08-08 DIAGNOSIS — D631 Anemia in chronic kidney disease: Secondary | ICD-10-CM | POA: Diagnosis not present

## 2020-08-08 DIAGNOSIS — D509 Iron deficiency anemia, unspecified: Secondary | ICD-10-CM | POA: Diagnosis not present

## 2020-08-08 DIAGNOSIS — N186 End stage renal disease: Secondary | ICD-10-CM | POA: Diagnosis not present

## 2020-08-08 LAB — POCT INR: INR: 4.2 — AB (ref 2.0–3.0)

## 2020-08-08 NOTE — Telephone Encounter (Signed)
Please see anti-coag note for today. 

## 2020-08-08 NOTE — Patient Instructions (Signed)
Called and spoke to pt instructed him to  - take 1/2 tablet warfarin today and then  - resume warfarin dosage of 1 tablet daily except for 2 tablets on Fridays.  - Recheck INR on Monday. . Coumadin Clinic [phone].

## 2020-08-08 NOTE — Telephone Encounter (Signed)
Patient calling with INR reading today  4.2 Was told to take 2 on Friday and 1 the rest of the week  - would like to know if this is still ok

## 2020-08-09 DIAGNOSIS — M1711 Unilateral primary osteoarthritis, right knee: Secondary | ICD-10-CM | POA: Diagnosis not present

## 2020-08-09 DIAGNOSIS — M069 Rheumatoid arthritis, unspecified: Secondary | ICD-10-CM | POA: Diagnosis not present

## 2020-08-09 DIAGNOSIS — T8789 Other complications of amputation stump: Secondary | ICD-10-CM | POA: Diagnosis not present

## 2020-08-09 DIAGNOSIS — N186 End stage renal disease: Secondary | ICD-10-CM | POA: Diagnosis not present

## 2020-08-09 DIAGNOSIS — T8610 Unspecified complication of kidney transplant: Secondary | ICD-10-CM | POA: Diagnosis not present

## 2020-08-09 DIAGNOSIS — A419 Sepsis, unspecified organism: Secondary | ICD-10-CM | POA: Diagnosis not present

## 2020-08-09 DIAGNOSIS — T8189XA Other complications of procedures, not elsewhere classified, initial encounter: Secondary | ICD-10-CM | POA: Diagnosis not present

## 2020-08-09 DIAGNOSIS — Z992 Dependence on renal dialysis: Secondary | ICD-10-CM | POA: Diagnosis not present

## 2020-08-09 DIAGNOSIS — M009 Pyogenic arthritis, unspecified: Secondary | ICD-10-CM | POA: Diagnosis not present

## 2020-08-10 DIAGNOSIS — D631 Anemia in chronic kidney disease: Secondary | ICD-10-CM | POA: Diagnosis not present

## 2020-08-10 DIAGNOSIS — Z992 Dependence on renal dialysis: Secondary | ICD-10-CM | POA: Diagnosis not present

## 2020-08-10 DIAGNOSIS — N186 End stage renal disease: Secondary | ICD-10-CM | POA: Diagnosis not present

## 2020-08-10 DIAGNOSIS — T8610 Unspecified complication of kidney transplant: Secondary | ICD-10-CM | POA: Diagnosis not present

## 2020-08-10 DIAGNOSIS — N2581 Secondary hyperparathyroidism of renal origin: Secondary | ICD-10-CM | POA: Diagnosis not present

## 2020-08-10 DIAGNOSIS — D509 Iron deficiency anemia, unspecified: Secondary | ICD-10-CM | POA: Diagnosis not present

## 2020-08-11 DIAGNOSIS — T8610 Unspecified complication of kidney transplant: Secondary | ICD-10-CM | POA: Diagnosis not present

## 2020-08-11 DIAGNOSIS — Z992 Dependence on renal dialysis: Secondary | ICD-10-CM | POA: Diagnosis not present

## 2020-08-11 DIAGNOSIS — N186 End stage renal disease: Secondary | ICD-10-CM | POA: Diagnosis not present

## 2020-08-12 ENCOUNTER — Inpatient Hospital Stay
Admission: EM | Admit: 2020-08-12 | Discharge: 2020-08-16 | DRG: 291 | Disposition: A | Discharge status: Home Health | Payer: Medicare Other | Attending: Internal Medicine | Admitting: Internal Medicine

## 2020-08-12 ENCOUNTER — Emergency Department: Payer: Medicare Other

## 2020-08-12 ENCOUNTER — Other Ambulatory Visit: Payer: Self-pay

## 2020-08-12 DIAGNOSIS — I5023 Acute on chronic systolic (congestive) heart failure: Secondary | ICD-10-CM | POA: Diagnosis present

## 2020-08-12 DIAGNOSIS — I959 Hypotension, unspecified: Secondary | ICD-10-CM | POA: Diagnosis not present

## 2020-08-12 DIAGNOSIS — E8779 Other fluid overload: Secondary | ICD-10-CM | POA: Diagnosis not present

## 2020-08-12 DIAGNOSIS — E875 Hyperkalemia: Secondary | ICD-10-CM | POA: Diagnosis present

## 2020-08-12 DIAGNOSIS — N2581 Secondary hyperparathyroidism of renal origin: Secondary | ICD-10-CM | POA: Diagnosis present

## 2020-08-12 DIAGNOSIS — Z89511 Acquired absence of right leg below knee: Secondary | ICD-10-CM | POA: Diagnosis not present

## 2020-08-12 DIAGNOSIS — R079 Chest pain, unspecified: Secondary | ICD-10-CM | POA: Diagnosis present

## 2020-08-12 DIAGNOSIS — Z7901 Long term (current) use of anticoagulants: Secondary | ICD-10-CM

## 2020-08-12 DIAGNOSIS — F419 Anxiety disorder, unspecified: Secondary | ICD-10-CM | POA: Diagnosis present

## 2020-08-12 DIAGNOSIS — Z79899 Other long term (current) drug therapy: Secondary | ICD-10-CM

## 2020-08-12 DIAGNOSIS — Z992 Dependence on renal dialysis: Secondary | ICD-10-CM | POA: Diagnosis not present

## 2020-08-12 DIAGNOSIS — D696 Thrombocytopenia, unspecified: Secondary | ICD-10-CM | POA: Diagnosis present

## 2020-08-12 DIAGNOSIS — Z85528 Personal history of other malignant neoplasm of kidney: Secondary | ICD-10-CM

## 2020-08-12 DIAGNOSIS — D539 Nutritional anemia, unspecified: Secondary | ICD-10-CM | POA: Diagnosis not present

## 2020-08-12 DIAGNOSIS — Z951 Presence of aortocoronary bypass graft: Secondary | ICD-10-CM

## 2020-08-12 DIAGNOSIS — Y830 Surgical operation with transplant of whole organ as the cause of abnormal reaction of the patient, or of later complication, without mention of misadventure at the time of the procedure: Secondary | ICD-10-CM | POA: Diagnosis present

## 2020-08-12 DIAGNOSIS — R7989 Other specified abnormal findings of blood chemistry: Secondary | ICD-10-CM | POA: Diagnosis present

## 2020-08-12 DIAGNOSIS — I132 Hypertensive heart and chronic kidney disease with heart failure and with stage 5 chronic kidney disease, or end stage renal disease: Secondary | ICD-10-CM | POA: Diagnosis not present

## 2020-08-12 DIAGNOSIS — Z7902 Long term (current) use of antithrombotics/antiplatelets: Secondary | ICD-10-CM

## 2020-08-12 DIAGNOSIS — R531 Weakness: Secondary | ICD-10-CM | POA: Diagnosis not present

## 2020-08-12 DIAGNOSIS — I739 Peripheral vascular disease, unspecified: Secondary | ICD-10-CM | POA: Diagnosis present

## 2020-08-12 DIAGNOSIS — K761 Chronic passive congestion of liver: Secondary | ICD-10-CM | POA: Diagnosis present

## 2020-08-12 DIAGNOSIS — R945 Abnormal results of liver function studies: Secondary | ICD-10-CM

## 2020-08-12 DIAGNOSIS — I953 Hypotension of hemodialysis: Secondary | ICD-10-CM | POA: Diagnosis present

## 2020-08-12 DIAGNOSIS — E162 Hypoglycemia, unspecified: Secondary | ICD-10-CM | POA: Diagnosis present

## 2020-08-12 DIAGNOSIS — I10 Essential (primary) hypertension: Secondary | ICD-10-CM | POA: Diagnosis not present

## 2020-08-12 DIAGNOSIS — I469 Cardiac arrest, cause unspecified: Secondary | ICD-10-CM | POA: Diagnosis not present

## 2020-08-12 DIAGNOSIS — I452 Bifascicular block: Secondary | ICD-10-CM | POA: Diagnosis present

## 2020-08-12 DIAGNOSIS — I25110 Atherosclerotic heart disease of native coronary artery with unstable angina pectoris: Secondary | ICD-10-CM | POA: Diagnosis present

## 2020-08-12 DIAGNOSIS — D631 Anemia in chronic kidney disease: Secondary | ICD-10-CM | POA: Diagnosis present

## 2020-08-12 DIAGNOSIS — D72829 Elevated white blood cell count, unspecified: Secondary | ICD-10-CM | POA: Diagnosis present

## 2020-08-12 DIAGNOSIS — Z79818 Long term (current) use of other agents affecting estrogen receptors and estrogen levels: Secondary | ICD-10-CM

## 2020-08-12 DIAGNOSIS — T8610 Unspecified complication of kidney transplant: Secondary | ICD-10-CM | POA: Diagnosis not present

## 2020-08-12 DIAGNOSIS — M069 Rheumatoid arthritis, unspecified: Secondary | ICD-10-CM | POA: Diagnosis present

## 2020-08-12 DIAGNOSIS — Z955 Presence of coronary angioplasty implant and graft: Secondary | ICD-10-CM

## 2020-08-12 DIAGNOSIS — I5020 Unspecified systolic (congestive) heart failure: Secondary | ICD-10-CM | POA: Diagnosis not present

## 2020-08-12 DIAGNOSIS — I248 Other forms of acute ischemic heart disease: Secondary | ICD-10-CM | POA: Diagnosis present

## 2020-08-12 DIAGNOSIS — R778 Other specified abnormalities of plasma proteins: Secondary | ICD-10-CM

## 2020-08-12 DIAGNOSIS — Z20822 Contact with and (suspected) exposure to covid-19: Secondary | ICD-10-CM | POA: Diagnosis present

## 2020-08-12 DIAGNOSIS — Z7952 Long term (current) use of systemic steroids: Secondary | ICD-10-CM

## 2020-08-12 DIAGNOSIS — R000 Tachycardia, unspecified: Secondary | ICD-10-CM

## 2020-08-12 DIAGNOSIS — I120 Hypertensive chronic kidney disease with stage 5 chronic kidney disease or end stage renal disease: Secondary | ICD-10-CM | POA: Diagnosis not present

## 2020-08-12 DIAGNOSIS — R0902 Hypoxemia: Secondary | ICD-10-CM | POA: Diagnosis not present

## 2020-08-12 DIAGNOSIS — R0602 Shortness of breath: Secondary | ICD-10-CM | POA: Diagnosis not present

## 2020-08-12 DIAGNOSIS — I472 Ventricular tachycardia, unspecified: Secondary | ICD-10-CM

## 2020-08-12 DIAGNOSIS — J811 Chronic pulmonary edema: Secondary | ICD-10-CM | POA: Diagnosis not present

## 2020-08-12 DIAGNOSIS — I25118 Atherosclerotic heart disease of native coronary artery with other forms of angina pectoris: Secondary | ICD-10-CM | POA: Diagnosis not present

## 2020-08-12 DIAGNOSIS — T8612 Kidney transplant failure: Secondary | ICD-10-CM | POA: Diagnosis present

## 2020-08-12 DIAGNOSIS — Z8616 Personal history of COVID-19: Secondary | ICD-10-CM

## 2020-08-12 DIAGNOSIS — I4891 Unspecified atrial fibrillation: Secondary | ICD-10-CM

## 2020-08-12 DIAGNOSIS — I252 Old myocardial infarction: Secondary | ICD-10-CM

## 2020-08-12 DIAGNOSIS — I2510 Atherosclerotic heart disease of native coronary artery without angina pectoris: Secondary | ICD-10-CM | POA: Diagnosis present

## 2020-08-12 DIAGNOSIS — N186 End stage renal disease: Secondary | ICD-10-CM | POA: Diagnosis present

## 2020-08-12 DIAGNOSIS — I499 Cardiac arrhythmia, unspecified: Secondary | ICD-10-CM | POA: Diagnosis not present

## 2020-08-12 DIAGNOSIS — G4733 Obstructive sleep apnea (adult) (pediatric): Secondary | ICD-10-CM | POA: Diagnosis present

## 2020-08-12 DIAGNOSIS — E8770 Fluid overload, unspecified: Secondary | ICD-10-CM | POA: Diagnosis not present

## 2020-08-12 DIAGNOSIS — I4819 Other persistent atrial fibrillation: Secondary | ICD-10-CM | POA: Diagnosis not present

## 2020-08-12 DIAGNOSIS — Z8249 Family history of ischemic heart disease and other diseases of the circulatory system: Secondary | ICD-10-CM

## 2020-08-12 DIAGNOSIS — R0689 Other abnormalities of breathing: Secondary | ICD-10-CM | POA: Diagnosis not present

## 2020-08-12 DIAGNOSIS — I517 Cardiomegaly: Secondary | ICD-10-CM | POA: Diagnosis not present

## 2020-08-12 DIAGNOSIS — I4821 Permanent atrial fibrillation: Secondary | ICD-10-CM | POA: Diagnosis present

## 2020-08-12 DIAGNOSIS — Z952 Presence of prosthetic heart valve: Secondary | ICD-10-CM

## 2020-08-12 DIAGNOSIS — Z993 Dependence on wheelchair: Secondary | ICD-10-CM

## 2020-08-12 DIAGNOSIS — E785 Hyperlipidemia, unspecified: Secondary | ICD-10-CM | POA: Diagnosis present

## 2020-08-12 DIAGNOSIS — Z888 Allergy status to other drugs, medicaments and biological substances status: Secondary | ICD-10-CM

## 2020-08-12 DIAGNOSIS — I471 Supraventricular tachycardia, unspecified: Secondary | ICD-10-CM

## 2020-08-12 DIAGNOSIS — R791 Abnormal coagulation profile: Secondary | ICD-10-CM | POA: Diagnosis present

## 2020-08-12 DIAGNOSIS — D649 Anemia, unspecified: Secondary | ICD-10-CM | POA: Diagnosis not present

## 2020-08-12 DIAGNOSIS — I4892 Unspecified atrial flutter: Secondary | ICD-10-CM | POA: Diagnosis present

## 2020-08-12 DIAGNOSIS — Z905 Acquired absence of kidney: Secondary | ICD-10-CM

## 2020-08-12 DIAGNOSIS — M109 Gout, unspecified: Secondary | ICD-10-CM | POA: Diagnosis present

## 2020-08-12 DIAGNOSIS — G4730 Sleep apnea, unspecified: Secondary | ICD-10-CM | POA: Diagnosis present

## 2020-08-12 DIAGNOSIS — K5900 Constipation, unspecified: Secondary | ICD-10-CM | POA: Diagnosis present

## 2020-08-12 DIAGNOSIS — E291 Testicular hypofunction: Secondary | ICD-10-CM | POA: Diagnosis present

## 2020-08-12 DIAGNOSIS — G931 Anoxic brain damage, not elsewhere classified: Secondary | ICD-10-CM | POA: Diagnosis not present

## 2020-08-12 DIAGNOSIS — I255 Ischemic cardiomyopathy: Secondary | ICD-10-CM | POA: Diagnosis present

## 2020-08-12 HISTORY — DX: Unspecified atrial flutter: I48.92

## 2020-08-12 LAB — CBC WITH DIFFERENTIAL/PLATELET
Abs Immature Granulocytes: 0.06 10*3/uL (ref 0.00–0.07)
Basophils Absolute: 0.1 10*3/uL (ref 0.0–0.1)
Basophils Relative: 1 %
Eosinophils Absolute: 0.1 10*3/uL (ref 0.0–0.5)
Eosinophils Relative: 0 %
HCT: 42.2 % (ref 39.0–52.0)
Hemoglobin: 12.3 g/dL — ABNORMAL LOW (ref 13.0–17.0)
Immature Granulocytes: 0 %
Lymphocytes Relative: 48 %
Lymphs Abs: 6.5 10*3/uL — ABNORMAL HIGH (ref 0.7–4.0)
MCH: 31.5 pg (ref 26.0–34.0)
MCHC: 29.1 g/dL — ABNORMAL LOW (ref 30.0–36.0)
MCV: 108.2 fL — ABNORMAL HIGH (ref 80.0–100.0)
Monocytes Absolute: 0.8 10*3/uL (ref 0.1–1.0)
Monocytes Relative: 6 %
Neutro Abs: 5.9 10*3/uL (ref 1.7–7.7)
Neutrophils Relative %: 45 %
Platelets: 106 10*3/uL — ABNORMAL LOW (ref 150–400)
RBC: 3.9 MIL/uL — ABNORMAL LOW (ref 4.22–5.81)
RDW: 18 % — ABNORMAL HIGH (ref 11.5–15.5)
Smear Review: DECREASED
WBC: 13.4 10*3/uL — ABNORMAL HIGH (ref 4.0–10.5)
nRBC: 0.4 % — ABNORMAL HIGH (ref 0.0–0.2)

## 2020-08-12 LAB — COMPREHENSIVE METABOLIC PANEL
ALT: 96 U/L — ABNORMAL HIGH (ref 0–44)
AST: 105 U/L — ABNORMAL HIGH (ref 15–41)
Albumin: 3.1 g/dL — ABNORMAL LOW (ref 3.5–5.0)
Alkaline Phosphatase: 179 U/L — ABNORMAL HIGH (ref 38–126)
Anion gap: 25 — ABNORMAL HIGH (ref 5–15)
BUN: 45 mg/dL — ABNORMAL HIGH (ref 8–23)
CO2: 19 mmol/L — ABNORMAL LOW (ref 22–32)
Calcium: 10.5 mg/dL — ABNORMAL HIGH (ref 8.9–10.3)
Chloride: 92 mmol/L — ABNORMAL LOW (ref 98–111)
Creatinine, Ser: 7.47 mg/dL — ABNORMAL HIGH (ref 0.61–1.24)
GFR, Estimated: 8 mL/min — ABNORMAL LOW (ref >60)
Glucose, Bld: 55 mg/dL — ABNORMAL LOW (ref 70–99)
Potassium: 5.6 mmol/L — ABNORMAL HIGH (ref 3.5–5.1)
Sodium: 136 mmol/L (ref 135–145)
Total Bilirubin: 2 mg/dL — ABNORMAL HIGH (ref 0.3–1.2)
Total Protein: 7.5 g/dL (ref 6.5–8.1)

## 2020-08-12 LAB — CBG MONITORING, ED
Glucose-Capillary: 143 mg/dL — ABNORMAL HIGH (ref 70–99)
Glucose-Capillary: 23 mg/dL — CL (ref 70–99)
Glucose-Capillary: 109 mg/dL — ABNORMAL HIGH (ref 70–99)
Glucose-Capillary: 102 mg/dL — ABNORMAL HIGH (ref 70–99)

## 2020-08-12 LAB — PROTIME-INR
INR: 4.2 (ref 0.8–1.2)
Prothrombin Time: 40.7 seconds — ABNORMAL HIGH (ref 11.4–15.2)

## 2020-08-12 LAB — HEPATITIS PANEL, ACUTE
HCV Ab: NONREACTIVE
Hep A IgM: NONREACTIVE
Hep B C IgM: NONREACTIVE
Hepatitis B Surface Ag: NONREACTIVE

## 2020-08-12 LAB — TROPONIN I (HIGH SENSITIVITY)
Troponin I (High Sensitivity): 69 ng/L — ABNORMAL HIGH (ref <18)
Troponin I (High Sensitivity): 82 ng/L — ABNORMAL HIGH (ref <18)
Troponin I (High Sensitivity): 182 ng/L (ref <18)

## 2020-08-12 LAB — RESP PANEL BY RT-PCR (FLU A&B, COVID) ARPGX2
Influenza A by PCR: NEGATIVE
Influenza B by PCR: NEGATIVE
SARS Coronavirus 2 by RT PCR: NEGATIVE

## 2020-08-12 LAB — ACETAMINOPHEN LEVEL: Acetaminophen (Tylenol), Serum: 11 ug/mL (ref 10–30)

## 2020-08-12 MED ORDER — ALBUTEROL SULFATE HFA 108 (90 BASE) MCG/ACT IN AERS
2.0000 | INHALATION_SPRAY | RESPIRATORY_TRACT | Status: DC | PRN
  Filled 2020-08-12: qty 6.7

## 2020-08-12 MED ORDER — DM-GUAIFENESIN ER 30-600 MG PO TB12: 1.0000 | ORAL_TABLET | Freq: Two times a day (BID) | ORAL | Status: DC | PRN

## 2020-08-12 MED ORDER — CLOPIDOGREL BISULFATE 75 MG PO TABS
75.0000 mg | ORAL_TABLET | Freq: Every day | ORAL | Status: DC
  Administered 2020-08-13 – 2020-08-16 (×4): 75 mg via ORAL
  Filled 2020-08-12 (×4): qty 1

## 2020-08-12 MED ORDER — INSULIN ASPART 100 UNIT/ML IJ SOLN
INTRAMUSCULAR | Status: AC
  Administered 2020-08-12: 10 [IU] via INTRAVENOUS
  Filled 2020-08-12: qty 1

## 2020-08-12 MED ORDER — LORATADINE 10 MG PO TABS
10.0000 mg | ORAL_TABLET | Freq: Every day | ORAL | Status: DC
  Administered 2020-08-12 – 2020-08-16 (×5): 10 mg via ORAL
  Filled 2020-08-12 (×5): qty 1

## 2020-08-12 MED ORDER — CALCIUM CHLORIDE 10 % IV SOLN
2.0000 g | Freq: Once | INTRAVENOUS | Status: AC
  Administered 2020-08-12: 2 g via INTRAVENOUS

## 2020-08-12 MED ORDER — TRAZODONE HCL 50 MG PO TABS
50.0000 mg | ORAL_TABLET | Freq: Every evening | ORAL | Status: DC | PRN
  Administered 2020-08-13 – 2020-08-14 (×3): 50 mg via ORAL
  Filled 2020-08-12 (×3): qty 1

## 2020-08-12 MED ORDER — CINACALCET HCL 30 MG PO TABS
30.0000 mg | ORAL_TABLET | Freq: Every day | ORAL | Status: DC
  Administered 2020-08-12 – 2020-08-15 (×4): 30 mg via ORAL
  Filled 2020-08-12 (×5): qty 1

## 2020-08-12 MED ORDER — PANTOPRAZOLE SODIUM 40 MG PO TBEC
40.0000 mg | DELAYED_RELEASE_TABLET | Freq: Every day | ORAL | Status: DC
  Administered 2020-08-12 – 2020-08-16 (×5): 40 mg via ORAL
  Filled 2020-08-12 (×5): qty 1

## 2020-08-12 MED ORDER — DEXTROSE 50 % IV SOLN
50.0000 mL | INTRAVENOUS | Status: DC | PRN
  Administered 2020-08-12: 50 mL via INTRAVENOUS
  Filled 2020-08-12: qty 50

## 2020-08-12 MED ORDER — HYDROXYCHLOROQUINE SULFATE 200 MG PO TABS
400.0000 mg | ORAL_TABLET | Freq: Every day | ORAL | Status: DC
  Administered 2020-08-12 – 2020-08-15 (×4): 400 mg via ORAL
  Filled 2020-08-12 (×5): qty 2

## 2020-08-12 MED ORDER — HYDRALAZINE HCL 20 MG/ML IJ SOLN: 5.0000 mg | INTRAMUSCULAR | Status: DC | PRN

## 2020-08-12 MED ORDER — HYDROXYZINE HCL 10 MG PO TABS
10.0000 mg | ORAL_TABLET | Freq: Three times a day (TID) | ORAL | Status: DC | PRN
  Administered 2020-08-13 – 2020-08-14 (×2): 10 mg via ORAL
  Filled 2020-08-12 (×5): qty 1

## 2020-08-12 MED ORDER — SEVELAMER CARBONATE 800 MG PO TABS
3200.0000 mg | ORAL_TABLET | Freq: Three times a day (TID) | ORAL | Status: DC
  Administered 2020-08-13 – 2020-08-16 (×10): 3200 mg via ORAL
  Filled 2020-08-12 (×11): qty 4

## 2020-08-12 MED ORDER — HYDROCORTISONE NA SUCCINATE PF 100 MG IJ SOLR: 50.0000 mg | Freq: Once | INTRAMUSCULAR | Status: DC

## 2020-08-12 MED ORDER — OXYCODONE-ACETAMINOPHEN 5-325 MG PO TABS: 1.0000 | ORAL_TABLET | ORAL | Status: DC | PRN

## 2020-08-12 MED ORDER — SODIUM BICARBONATE 8.4 % IV SOLN
50.0000 meq | Freq: Once | INTRAVENOUS | Status: AC
  Administered 2020-08-12: 50 meq via INTRAVENOUS
  Filled 2020-08-12: qty 50

## 2020-08-12 MED ORDER — ROSUVASTATIN CALCIUM 10 MG PO TABS
40.0000 mg | ORAL_TABLET | Freq: Every day | ORAL | Status: DC
  Administered 2020-08-12 – 2020-08-15 (×4): 40 mg via ORAL
  Filled 2020-08-12 (×3): qty 4
  Filled 2020-08-12: qty 2

## 2020-08-12 MED ORDER — SODIUM ZIRCONIUM CYCLOSILICATE 10 G PO PACK
10.0000 g | PACK | Freq: Once | ORAL | Status: AC
  Administered 2020-08-12: 10 g via ORAL
  Filled 2020-08-12: qty 1

## 2020-08-12 MED ORDER — LOPERAMIDE HCL 2 MG PO CAPS
2.0000 mg | ORAL_CAPSULE | Freq: Four times a day (QID) | ORAL | Status: DC | PRN
  Administered 2020-08-15 – 2020-08-16 (×2): 2 mg via ORAL
  Filled 2020-08-12 (×2): qty 1

## 2020-08-12 MED ORDER — SODIUM CHLORIDE 0.9 % IV SOLN
250.0000 mL | INTRAVENOUS | Status: DC | PRN
  Administered 2020-08-15: 250 mL via INTRAVENOUS

## 2020-08-12 MED ORDER — INSULIN ASPART 100 UNIT/ML IJ SOLN
10.0000 [IU] | Freq: Once | INTRAMUSCULAR | Status: AC
  Administered 2020-08-12: 10 [IU] via INTRAVENOUS

## 2020-08-12 MED ORDER — LIDOCAINE-PRILOCAINE 2.5-2.5 % EX CREA
1.0000 "application " | TOPICAL_CREAM | Freq: Every day | CUTANEOUS | Status: DC | PRN
  Filled 2020-08-12: qty 5

## 2020-08-12 MED ORDER — DEXTROSE 50 % IV SOLN
1.0000 | Freq: Once | INTRAVENOUS | Status: AC
  Administered 2020-08-12: 50 mL via INTRAVENOUS

## 2020-08-12 MED ORDER — PHENYLEPHRINE IN HARD FAT 0.25 % RE SUPP
1.0000 | Freq: Two times a day (BID) | RECTAL | Status: DC | PRN
  Filled 2020-08-12: qty 1

## 2020-08-12 MED ORDER — WARFARIN - PHARMACIST DOSING INPATIENT
Freq: Every day | Status: DC
  Filled 2020-08-12: qty 1

## 2020-08-12 MED ORDER — RENA-VITE PO TABS
1.0000 | ORAL_TABLET | Freq: Every day | ORAL | Status: DC
  Administered 2020-08-13 – 2020-08-15 (×3): 1 via ORAL
  Filled 2020-08-12 (×4): qty 1

## 2020-08-12 MED ORDER — METOPROLOL TARTRATE 50 MG PO TABS
100.0000 mg | ORAL_TABLET | Freq: Two times a day (BID) | ORAL | Status: DC
  Administered 2020-08-12 – 2020-08-14 (×4): 100 mg via ORAL
  Filled 2020-08-12 (×4): qty 2

## 2020-08-12 MED ORDER — SODIUM CHLORIDE 0.9% FLUSH: 3.0000 mL | INTRAVENOUS | Status: DC | PRN

## 2020-08-12 MED ORDER — ALLOPURINOL 100 MG PO TABS
100.0000 mg | ORAL_TABLET | Freq: Every day | ORAL | Status: DC
  Administered 2020-08-12 – 2020-08-16 (×5): 100 mg via ORAL
  Filled 2020-08-12 (×5): qty 1

## 2020-08-12 MED ORDER — MIDODRINE HCL 5 MG PO TABS
10.0000 mg | ORAL_TABLET | ORAL | Status: DC
  Administered 2020-08-13: 10 mg via ORAL
  Filled 2020-08-12: qty 2

## 2020-08-12 MED ORDER — POLYETHYLENE GLYCOL 3350 17 G PO PACK: 17.0000 g | PACK | Freq: Every day | ORAL | Status: DC | PRN

## 2020-08-12 MED ORDER — NITROGLYCERIN 0.4 MG SL SUBL: 0.4000 mg | SUBLINGUAL_TABLET | SUBLINGUAL | Status: DC | PRN

## 2020-08-12 MED ORDER — EZETIMIBE 10 MG PO TABS
10.0000 mg | ORAL_TABLET | Freq: Every day | ORAL | Status: DC
  Administered 2020-08-12 – 2020-08-15 (×4): 10 mg via ORAL
  Filled 2020-08-12 (×4): qty 1

## 2020-08-12 MED ORDER — SODIUM CHLORIDE 0.9% FLUSH
3.0000 mL | Freq: Two times a day (BID) | INTRAVENOUS | Status: DC
  Administered 2020-08-12 – 2020-08-16 (×8): 3 mL via INTRAVENOUS

## 2020-08-12 MED ORDER — DEXTROSE 50 % IV SOLN
INTRAVENOUS | Status: AC
  Administered 2020-08-12: 50 mL via INTRAVENOUS
  Filled 2020-08-12: qty 50

## 2020-08-12 NOTE — ED Notes (Signed)
Dialysis nurse asked this RN to check a CBG due to pt becoming diaphoretic all of a sudden, CBG 23, PRN orders for D50 to be given

## 2020-08-12 NOTE — ED Notes (Signed)
Dialysis at bedside

## 2020-08-12 NOTE — ED Notes (Signed)
X RAY at bedside 

## 2020-08-12 NOTE — Progress Notes (Signed)
[facility]  ROUNDING NOTE   Subjective:   [name] was admitted to [facility] on 08/12/2020 for Fluid overload [E87.70]  Last hemodialysis treatment was 5/6. Patient completed his dialysis treatment. Patient was at his estimated dry weight at the end of treatment, 70.5 Kg  Patient was getting more short of breath since yesterday. He has been more weak. He denies any changes to his diet but states he is constipated.   Patient with V tach on the field. Was given IV amiodarone and 1 g of calcium. He was then given 2 g of calcium, insulin and dextrose when brought to the ED. Follow up K of 5.6.   Patient is now feeling better and wife is at bedside.   Patient feels that he has more volume and peripheral edema.    Objective:  Vital signs in last 24 hours:  Temp:  [97.8 F (36.6 C)] 97.8 F (36.6 C) (05/08 1159) Pulse Rate:  [95-145] 114 (05/08 1159) Resp:  [17-29] 27 (05/08 1159) BP: (110-157)/(75-83) 134/81 (05/08 1130) SpO2:  [92 %-100 %] 99 % (05/08 1159) Weight:  [75.6 kg] 75.6 kg (05/08 0942)  Weight change:  Filed Weights   08/12/20 0942  Weight: 75.6 kg    Intake/Output: No intake/output data recorded.   Intake/Output this shift:  No intake/output data recorded.  Physical Exam: General: NAD, sitting up in stretcher  Head: Normocephalic, atraumatic. Moist oral mucosal membranes  Eyes: Anicteric, PERRL  Neck: Supple, trachea midline  Lungs:  Diminished bilaterally  Heart: Irregular, +murmur  Abdomen:  Soft, nontender,   Extremities:  no edema, Right BKA with clean dressings.   Neurologic: Nonfocal, moving all four extremities  Skin: No lesions  Access: Left AVF    Basic Metabolic Panel: Recent Labs  Lab 08/12/20 0946  NA 136  K 5.6*  CL 92*  CO2 19*  GLUCOSE 55*  BUN 45*  CREATININE 7.47*  CALCIUM 10.5*    Liver Function Tests: Recent Labs  Lab 08/12/20 0946  AST 105*  ALT 96*  ALKPHOS 179*  BILITOT 2.0*  PROT 7.5  ALBUMIN  3.1*   No results for input(s): LIPASE, AMYLASE in the last 168 hours. No results for input(s): AMMONIA in the last 168 hours.  CBC: Recent Labs  Lab 08/12/20 0946  WBC 13.4*  NEUTROABS 5.9  HGB 12.3*  HCT 42.2  MCV 108.2*  PLT 106*    Cardiac Enzymes: No results for input(s): CKTOTAL, CKMB, CKMBINDEX, TROPONINI in the last 168 hours.  BNP: Invalid input(s): POCBNP  CBG: No results for input(s): GLUCAP in the last 168 hours.  Microbiology: Results for orders placed or performed during the hospital encounter of 08/12/20  Resp Panel by RT-PCR (Flu A&B, Covid) Nasopharyngeal Swab     Status: None   Collection Time: 08/12/20  9:46 AM   Specimen: Nasopharyngeal Swab; Nasopharyngeal(NP) swabs in vial transport medium  Result Value Ref Range Status   SARS Coronavirus 2 by RT PCR NEGATIVE NEGATIVE Final    Comment: (NOTE) SARS-CoV-2 target nucleic acids are NOT DETECTED.  The SARS-CoV-2 RNA is generally detectable in upper respiratory specimens during the acute phase of infection. The lowest concentration of SARS-CoV-2 viral copies this assay can detect is 138 copies/mL. A negative result does not preclude SARS-Cov-2 infection and should not be used as the sole basis for treatment or other patient management decisions. A negative result may occur with  improper specimen collection/handling, submission of specimen other than nasopharyngeal swab, presence  of viral mutation(s) within the areas targeted by this assay, and inadequate number of viral copies(<138 copies/mL). A negative result must be combined with clinical observations, patient history, and epidemiological information. The expected result is Negative.  Fact Sheet for Patients:  EntrepreneurPulse.com.au  Fact Sheet for Healthcare Providers:  IncredibleEmployment.be  This test is no t yet approved or cleared by the [place] FDA and  has been authorized for detection  and/or diagnosis of SARS-CoV-2 by FDA under an Emergency Use Authorization (EUA). This EUA will remain  in effect (meaning this test can be used) for the duration of the COVID-19 declaration under Section 564(b)(1) of the Act, 21 U.S.C.section 360bbb-3(b)(1), unless the authorization is terminated  or revoked sooner.       Influenza A by PCR NEGATIVE NEGATIVE Final   Influenza B by PCR NEGATIVE NEGATIVE Final    Comment: (NOTE) The Xpert Xpress SARS-CoV-2/FLU/RSV plus assay is intended as an aid in the diagnosis of influenza from Nasopharyngeal swab specimens and should not be used as a sole basis for treatment. Nasal washings and aspirates are unacceptable for Xpert Xpress SARS-CoV-2/FLU/RSV testing.  Fact Sheet for Patients: EntrepreneurPulse.com.au  Fact Sheet for Healthcare Providers: IncredibleEmployment.be  This test is not yet approved or cleared by the [place] FDA and has been authorized for detection and/or diagnosis of SARS-CoV-2 by FDA under an Emergency Use Authorization (EUA). This EUA will remain in effect (meaning this test can be used) for the duration of the COVID-19 declaration under Section 564(b)(1) of the Act, 21 U.S.C. section 360bbb-3(b)(1), unless the authorization is terminated or revoked.  Performed at [facility], [address] [place], [place] [address]    *Note: Due to a large number of results and/or encounters for the requested time period, some results have not been displayed. A complete set of results can be found in Results Review.    Coagulation Studies: Recent Labs    08/12/20 0946  LABPROT 40.7*  INR 4.2*    Urinalysis: No results for input(s): COLORURINE, LABSPEC, PHURINE, GLUCOSEU, HGBUR, BILIRUBINUR, KETONESUR, PROTEINUR, UROBILINOGEN, NITRITE, LEUKOCYTESUR in the last 72 hours.  Invalid input(s): APPERANCEUR    Imaging: DG Chest Portable 1 View  Result Date:  08/12/2020 CLINICAL DATA:  65 year old male with shortness of breath EXAM: PORTABLE CHEST 1 VIEW COMPARISON:  Prior chest x-ray 07/18/2020 FINDINGS: Stable cardiomegaly. Increased vascular congestion with mild interstitial edema. Patient is status post median sternotomy with evidence of prior multivessel CABG and aortic valve replacement. Atherosclerotic calcifications present in the transverse aorta. No large effusion or pneumothorax. External defibrillator pad projects over the central chest. No acute osseous abnormality. IMPRESSION: 1. Mild CHF versus volume overload. 2. Cardiomegaly. 3. Aortic atherosclerotic calcifications. Electronically Signed   By: Jacqulynn Cadet M.D.   On: 08/12/2020 10:07     Medications:    . Warfarin - Pharmacist Dosing Inpatient   Does not apply q1600   albuterol, dextromethorphan-guaiFENesin, dextrose, hydrALAZINE, hydrOXYzine  Assessment/ Plan:  [name] is a 65 y.o. 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 End Stage Renal Disease with fluid overload and hyperkalemia -  emergent hemodialysis treatment for today. Orders prepared.   2. Hypertension: 149/90  - midodrine before dialysis treatments.   3. Anemia of chronic kidney disease: hemoglobin 12.3, Macrocytic. Retacrit as outpatient.   4. Secondary Hyperparathyroidism:  - sevelamer with meals - cinacalcet.    LOS: 0 Sydnei Ohaver 5/8/202212:12 PM

## 2020-08-12 NOTE — ED Notes (Signed)
10 units IVP insulin  50 ml dextrose given

## 2020-08-12 NOTE — ED Notes (Signed)
RN messaged a second time. Waiting for response.

## 2020-08-12 NOTE — ED Notes (Signed)
1 gm calcium given IVP

## 2020-08-12 NOTE — Consult Note (Signed)
ANTICOAGULATION CONSULT NOTE - Initial Consult  Pharmacy Consult for warfarin Indication: s/p of AVR with mechanical valve  Allergies  Allergen Reactions  . Statins Other (See Comments)    Arthralgias (intolerance), Myalgias (intolerance)  Pt taking pravastatin   . Gabapentin     Drowsy   . Sertraline Rash    Patient Measurements: Weight: 75.6 kg (166 lb 10.7 oz)   Vital Signs: BP: 157/83 (05/08 1030) Pulse Rate: 106 (05/08 1030)  Labs: Recent Labs    08/12/20 0946  HGB 12.3*  HCT 42.2  PLT 106*  LABPROT 40.7*  INR 4.2*  CREATININE 7.47*  TROPONINIHS 69*    Estimated Creatinine Clearance: 10.5 mL/min (A) (by C-G formula based on SCr of 7.47 mg/dL (H)).   Medical History: Past Medical History:  Diagnosis Date  . Abnormal stress test 03/19/2018  . 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 Anxiety    situation  . Aortic stenosis    a. 2014 s/p mech AVR ([facility])-->chronic coumadin; b. 08/2018 Echo: Mild AS/AI. Nl fxn/gradient.  . Arthritis   . Atrial fibrillation ([place])   . CAD (coronary artery disease)    a. 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 02/2018 MV [facility]): large inf, infsept, inflat infarct w/ min rev, EF 30%; c. 06/2018 Cath: LM min irregs, LAD 60p, 95/30/44m, LCX min irregs, RCA 40p, 63m, 90d, RPAV 90, RPDA 95ost; d. 09/2018 CABGx3 (LIMA->LAD, VG->D1, VG->PDA).  . Chronic anticoagulation 06/2012   a. Coumadin in the setting of mech AVR and afib.  . Complication of anesthesia   . COVID-19 03/15/2019  . CPAP (continuous positive airway pressure) dependence   . Dialysis patient [facility]) 04/07/2006   Patient started HD around 1996,  had 1st transplant LLQ around 1998 until 2007 when they found renal cell cancer in transplant and both native kidneys, all were removed > went back on HD until 2nd transplant RLQ in 2014 which lasted 3 yrs; it was embolized for suspected acute rejection. Is currently on  HD MWF in [place] w/ [facility] nephrology.  . ESRD (end stage renal disease) ([place])    a. CKD 2/2 to IgA nephropathy (dx age 6); b. 1999 s/p Kidney transplant; c. 2014 Bilat nephrectomy (transplanted kidney resected) in the setting of renal cell carcinoma; d. PD until 11/2017-->HD.  Marland Kitchen Heart murmur   . HFrEF (heart failure with reduced ejection fraction) ([place])    a. 07/2016 Echo: EF 55-60%; b. 02/2018 MV: EF 30%; c. 04/2018 Echo: EF 35%; d. 08/2018 Echo: EF 30-35%. Glob HK w/o rwma. Mildly reduced RV fxn. Mod to sev dil LA. Nl AoV fxn.  Marland Kitchen History of blood transfusion   . Hx of colonoscopy    2009 by Dr. Laural Golden. Normal except for small submucosal lipoma. No evidence of polyps or colitis.  . Hyperlipidemia   . Hypertension   . Hypogonadism in male 07/25/2015  . Ischemic cardiomyopathy    a. 07/2016 Echo: EF 55-60%; b. 02/2018 MV: EF 30%; c. 04/2018 Echo: EF 35%; d. 08/2018 Echo: EF 30-35%.  . Myocardial infarction ([place])   . OSA (obstructive sleep apnea)    No CPAP  . Peripheral vascular disease ([place])   . Permanent atrial fibrillation ([facility])    a. CHA2DS2VASc = 3-->Coumadin (also mech AVR); b. 06/2018 Amio started 2/2 elevated rates.  Marland Kitchen PONV (postoperative nausea and vomiting)   . Renal cell cancer ([place])   . Renal  cell carcinoma ([place]) 08/04/2016    Medications:  PTA: Warfarin 1 mg daily, Friday dose may change depending on reading per chart review.   No DDI concerns at this time  Assessment: 65 yo M presenting with chest pain and shortness of breath. 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 Pharmacy has been consulted to dose and monitor warfarin.   Goal of Therapy:  INR 2.5-3.5  (per chart review) Monitor platelets by anticoagulation protocol: Yes   Date INR Dose 5/8 4.2  Hold  Plan:  Hold warfarin dose today due to elevated INR Recheck INR and CBC with AM labs  Benn Moulder, PharmD Pharmacy Resident   08/12/2020 11:30 AM

## 2020-08-12 NOTE — ED Notes (Signed)
Repeat CBG 143

## 2020-08-12 NOTE — H&P (Signed)
History and Physical    [name] DOB: [birth year] DOA: 08/12/2020  Referring MD/NP/PA:   PCP: Jeremy Haven, MD   Patient coming from:  The patient is coming from home.  At baseline, pt is independent for most of ADL.        Chief Complaint: SOB  HPI: Jeremy [name] is a 65 y.o. male with medical history significant of ESRD-HD (MWF), s/p of mechanical AVR on Coumadin, HTN, HLD, gout, RA, thrombocytopenia, atrial fibrillation, OSA on CPAP, CAD, CABG, sCHF, PVD, anemia, renal cell carcinoma, failed kidney transplantation, who presents with shortness breath.  Patient states that he started having shortness of breath, which has been progressively worsening since yesterday.  He has dry cough, denies fever or chills.  Patient has some central chest pain earlier, which has resolved currently.  Patient is constipated, denies nausea, vomiting, diarrhea or abdominal pain.  No symptoms of UTI.  He reports generalized weakness.  No urinary numbness or tingling extremities.  No facial droop or slurred speech. Per EDP, paramedics transmitted an EKG concerning for V. tach and EDP recommended to give 1 g of calcium and 150 mg bolus of amiodarone. pt states that he had right BKA recently and his wound is goot healing process.   ED Course: pt was found to have negative COVID PCR, WBC 13.4, INR 4.2, troponin level 69, potassium 5.6, bicarbonate 19, creatinine 7.47, BUN 45, abnormal liver function (ALP 179, AST 105, ALT 96, bilirubin 2.0), blood pressure 137, heart rate 145 --> 110s, temperature normal, RR 28, oxygen saturation 92% on room air, 100% on 2 L oxygen, chest x-ray showed cardiomegaly, volume overload.  Patient is admitted to progressive bed as inpatient.  Review of Systems:   General: no fevers, chills, no body weight gain, has fatigue HEENT: no blurry vision, hearing changes or sore throat Respiratory: has dyspnea, coughing, no wheezing CV: has chest pain, no palpitations GI: no  nausea, vomiting, abdominal pain, diarrhea, has constipation GU: no dysuria, burning on urination, increased urinary frequency, hematuria  Ext: no leg edema Neuro: no unilateral weakness, numbness, or tingling, no vision change or hearing loss Skin: no rash, no skin tear. MSK: No muscle spasm, no deformity, no limitation of range of movement in spin Heme: No easy bruising.  Travel history: No recent long distant travel.  Allergy:  Allergies  Allergen Reactions  . Statins Other (See Comments)    Arthralgias (intolerance), Myalgias (intolerance)  Pt taking pravastatin   . Gabapentin     Drowsy   . Sertraline Rash    Past Medical History:  Diagnosis Date  . Abnormal stress test 03/19/2018  . 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 Anxiety    situation  . Aortic stenosis    a. 2014 s/p mech AVR ([facility])-->chronic coumadin; b. 08/2018 Echo: Mild AS/AI. Nl fxn/gradient.  . Arthritis   . Atrial fibrillation ([place])   . CAD (coronary artery disease)    a. 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 02/2018 MV ([facility]: large inf, infsept, inflat infarct w/ min rev, EF 30%; c. 06/2018 Cath: LM min irregs, LAD 60p, 95/30/65m, LCX min irregs, RCA 40p, 741m90d, RPAV 90, RPDA 95ost; d. 09/2018 CABGx3 (LIMA->LAD, VG->D1, VG->PDA).  . Chronic anticoagulation 06/2012   a. Coumadin in the setting of mech AVR and afib.  . Complication of anesthesia   . COVID-19 03/15/2019  . CPAP (  continuous positive airway pressure) dependence   . Dialysis patient [facility]) 04/07/2006   Patient started HD around 1996,  had 1st transplant LLQ around 1998 until 2007 when they found renal cell cancer in transplant and both native kidneys, all were removed > went back on HD until 2nd transplant RLQ in 2014 which lasted 3 yrs; it was embolized for suspected acute rejection. Is currently on HD MWF in [place] w/ [facility] nephrology.  . ESRD (end stage renal disease)  ([place])    a. 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 1999 s/p Kidney transplant; c. 2014 Bilat nephrectomy (transplanted kidney resected) in the setting of renal cell carcinoma; d. PD until 11/2017-->HD.  Marland Kitchen Heart murmur   . HFrEF (heart failure with reduced ejection fraction) ([place])    a. 07/2016 Echo: EF 55-60%; b. 02/2018 MV: EF 30%; c. 04/2018 Echo: EF 35%; d. 08/2018 Echo: EF 30-35%. Glob HK w/o rwma. Mildly reduced RV fxn. Mod to sev dil LA. Nl AoV fxn.  Marland Kitchen History of blood transfusion   . Hx of colonoscopy    2009 by Dr. Laural Golden. Normal except for small submucosal lipoma. No evidence of polyps or colitis.  . Hyperlipidemia   . Hypertension   . Hypogonadism in male 07/25/2015  . Ischemic cardiomyopathy    a. 07/2016 Echo: EF 55-60%; b. 02/2018 MV: EF 30%; c. 04/2018 Echo: EF 35%; d. 08/2018 Echo: EF 30-35%.  . Myocardial infarction ([place])   . OSA (obstructive sleep apnea)    No CPAP  . Peripheral vascular disease ([place])   . Permanent atrial fibrillation (HCC)    a. CHA2DS2VASc = 3-->Coumadin (also mech AVR); b. 06/2018 Amio started 2/2 elevated rates.  Marland Kitchen PONV (postoperative nausea and vomiting)   . Renal cell cancer ([place])   . Renal cell carcinoma ([place]) 08/04/2016    Past Surgical History:  Procedure Laterality Date  . ABDOMINAL AORTOGRAM W/LOWER EXTREMITY N/A 12/22/2019   Procedure: ABDOMINAL AORTOGRAM W/LOWER EXTREMITY;  Surgeon: Marty Heck, MD;  Location: [place] [facility];  Service: Cardiovascular;  Laterality: N/A;  . AMPUTATION Right 04/14/2020   Procedure: RIGHT BELOW KNEE AMPUTATION;  Surgeon: Marty Heck, MD;  Location: [place];  Service: Vascular;  Laterality: Right;  . AORTIC VALVE REPLACEMENT  3 /11 /2014   At [place] Right 07/13/2020   Procedure: APPLICATION OF WOUND VAC;  Surgeon: Marty Heck, MD;  Location: [place];  Service: Vascular;  Laterality: Right;  . BACK SURGERY    . CAPD INSERTION N/A  02/19/2017   Procedure: LAPAROSCOPIC INSERTION CONTINUOUS AMBULATORY PERITONEAL DIALYSIS  (CAPD) CATHETER;  Surgeon: Algernon Huxley, MD;  Location: [facility];  Service: General;  Laterality: N/A;  . CORONARY ANGIOPLASTY WITH STENT PLACEMENT    . CORONARY ARTERY BYPASS GRAFT N/A 09/16/2018   Procedure: CORONARY ARTERY BYPASS GRAFT times three      with left internal mammary artery and endoharvest of right leg greater saphenous vein;  Surgeon: Melrose Nakayama, MD;  Location: [place];  Service: Open Heart Surgery;  Laterality: N/A;  . CORONARY STENT INTERVENTION N/A 07/15/2019   Procedure: CORONARY STENT INTERVENTION;  Surgeon: Wellington Hampshire, MD;  Location: [place] [facility];  Service: Cardiovascular;  Laterality: N/A;  . DG AV DIALYSIS  SHUNT ACCESS EXIST*L* OR     left arm  . EPIGASTRIC HERNIA REPAIR N/A 02/19/2017   Procedure: HERNIA REPAIR EPIGASTRIC ADULT;  Surgeon:  Robert Bellow, MD;  Location: [facility];  Service: General;  Laterality: N/A;  . EYE SURGERY     bilateral cataract  . HERNIA REPAIR     UMBILICAL HERNIA  . HERNIA REPAIR  02/19/2017  . INNER EAR SURGERY     20 years ago  . KIDNEY TRANSPLANT    . KNEE ARTHROSCOPY WITH MEDIAL MENISECTOMY Right 07/02/2020   Procedure: KNEE ARTHROSCOPY WITH INCISION AND DRAINAGE;  Surgeon: Leim Fabry, MD;  Location: [facility];  Service: Orthopedics;  Laterality: Right;  . LEFT HEART CATH AND CORS/GRAFTS ANGIOGRAPHY N/A 07/15/2019   Procedure: LEFT HEART CATH AND CORS/GRAFTS ANGIOGRAPHY;  Surgeon: Wellington Hampshire, MD;  Location: [place] [facility];  Service: Cardiovascular;  Laterality: N/A;  . LOWER EXTREMITY ANGIOGRAPHY Right 08/25/2019   Procedure: LOWER EXTREMITY ANGIOGRAPHY;  Surgeon: Algernon Huxley, MD;  Location: [place] [facility];  Service: Cardiovascular;  Laterality: Right;  . LOWER EXTREMITY ANGIOGRAPHY Left 09/29/2019   Procedure: Lower Extremity Angiography;  Surgeon: Algernon Huxley, MD;  Location: [place] [facility];  Service:  Cardiovascular;  Laterality: Left;  . PERIPHERAL VASCULAR INTERVENTION Right 12/22/2019   Procedure: PERIPHERAL VASCULAR INTERVENTION;  Surgeon: Marty Heck, MD;  Location: [place] [facility];  Service: Cardiovascular;  Laterality: Right;  . Peritoneal Dialysis access placement  02/19/2017  . PSEUDOANERYSM COMPRESSION N/A 09/29/2019   Procedure: PSEUDOANERYSM COMPRESSION;  Surgeon: Algernon Huxley, MD;  Location: [place] [facility];  Service: Cardiovascular;  Laterality: N/A;  . REMOVAL OF A DIALYSIS CATHETER N/A 11/23/2017   Procedure: REMOVAL OF A PERITONEAL DIALYSIS CATHETER;  Surgeon: Algernon Huxley, MD;  Location: [facility];  Service: Vascular;  Laterality: N/A;  . RIGHT/LEFT HEART CATH AND CORONARY ANGIOGRAPHY Bilateral 06/07/2018   Procedure: RIGHT/LEFT HEART CATH AND CORONARY ANGIOGRAPHY;  Surgeon: Wellington Hampshire, MD;  Location: [place] [facility];  Service: Cardiovascular;  Laterality: Bilateral;  . TEE WITHOUT CARDIOVERSION N/A 07/21/2016   Procedure: TRANSESOPHAGEAL ECHOCARDIOGRAM (TEE);  Surgeon: Wellington Hampshire, MD;  Location: [facility];  Service: Cardiovascular;  Laterality: N/A;  . TEE WITHOUT CARDIOVERSION N/A 09/16/2018   Procedure: TRANSESOPHAGEAL ECHOCARDIOGRAM (TEE);  Surgeon: Melrose Nakayama, MD;  Location: [place];  Service: Open Heart Surgery;  Laterality: N/A;  . WOUND DEBRIDEMENT Right 07/13/2020   Procedure: RIGHT BELOW KNEE AMPUTATION DEBRIDEMENT WITH [facility];  Surgeon: Marty Heck, MD;  Location: [facility];  Service: Vascular;  Laterality: Right;    Social History:  reports that he has never smoked. He has never used smokeless tobacco. He reports previous alcohol use. He reports that he does not use drugs.  Family History:  Family History  Problem Relation Age of Onset  . Cancer Mother        pancreatic  . Heart disease Father   . Diabetes Father      Prior to Admission medications   Medication Sig Start Date End Date Taking?  Authorizing Provider  acetaminophen (TYLENOL) 500 MG tablet Take 1 tablet (500 mg total) by mouth every 4 (four) hours as needed. 05/03/20   Love, Ivan Anchors, PA-C  allopurinol (ZYLOPRIM) 100 MG tablet Take 1 tablet (100 mg total) by mouth daily. 05/03/20   Love, Ivan Anchors, PA-C  Carbomer Gel Base (HYDROGEL) GEL Apply gel to graft site of right lower extremity 08/07/20   Vaughan Basta, Edman Circle, PA-C  CHOLECALCIFEROL PO Take by mouth. 05/14/20 05/13/21  [provider]  cinacalcet (SENSIPAR) 30 MG tablet TAKE  1 TABLET BY MOUTH DAILY WITH FOOD (DO NOT TAKE LESS THAN 12 HOURS PRIOR TO DIALYSIS) 07/16/20   [provider]  clopidogrel (PLAVIX) 75 MG tablet Take 1 tablet (75 mg total) by mouth daily with breakfast. 07/29/19   Wellington Hampshire, MD  dicyclomine (BENTYL) 20 MG tablet Take 1 tablet (20 mg total) by mouth 2 (two) times daily. 07/18/20   Tedd Sias, PA  ezetimibe (ZETIA) 10 MG tablet TAKE 1 TABLET BY MOUTH EVERY DAY 03/19/20   Rise Mu, PA-C  hydroxychloroquine (PLAQUENIL) 200 MG tablet Take 400 mg by mouth at bedtime. 09/01/19   [provider]  iron sucrose in sodium chloride 0.9 % 100 mL Iron Sucrose (Venofer) 08/01/20 07/24/21  [provider]  lidocaine-prilocaine (EMLA) cream Apply 1 application topically daily as needed (for fistula).    [provider]  loperamide (IMODIUM A-D) 2 MG tablet Take 2 mg by mouth 4 (four) times daily as needed for diarrhea or loose stools.    [provider]  megestrol (MEGACE) 40 MG/ML suspension TAKE 10 ML (400 MG TOTAL) BY MOUTH DAILY. 05/04/20   [provider]  methocarbamol (ROBAXIN) 500 MG tablet Take 1 tablet (500 mg total) by mouth 2 (two) times daily. 07/18/20   Tedd Sias, PA  metoprolol tartrate (LOPRESSOR) 100 MG tablet Take 1 tablet (100 mg total) by mouth 2 (two) times daily. 07/08/20   Jennye Boroughs, MD  midodrine (PROAMATINE) 10 MG tablet TAKE 1 TABLET BY MOUTH 3 TIMES A WEEK AS DIRECTED  TAKE 1 TAB BEFORE DIALYSIS MAY REPEAT ONCE AS NEED 06/13/20   [provider]  multivitamin (RENA-VIT) TABS tablet Take 1 tablet by mouth daily. 02/23/19   [provider]  nitroGLYCERIN (NITROSTAT) 0.4 MG SL tablet Place 1 tablet (0.4 mg total) under the tongue every 5 (five) minutes as needed for chest pain. 06/24/19 03/13/20  Loel Dubonnet, NP  oxyCODONE-acetaminophen (PERCOCET/ROXICET) 5-325 MG tablet Take 1 tablet by mouth every 4 (four) hours as needed. 07/13/20   Ulyses Amor, PA-C  pantoprazole (PROTONIX) 40 MG tablet Take 1 tablet (40 mg total) by mouth daily. 07/29/19   Wellington Hampshire, MD  polyethylene glycol (MIRALAX / GLYCOLAX) 17 g packet Take 17 g by mouth daily as needed for mild constipation. 05/03/20   Love, Ivan Anchors, PA-C  polyethylene glycol powder (GLYCOLAX/MIRALAX) 17 GM/SCOOP powder Take 17 g by mouth in the morning, at noon, and at bedtime. One scoop in beverage of your choice with each meal. You may increase if you continue to be constipated and decrease if your stool becomes too loose. 07/18/20   Tedd Sias, PA  predniSONE (DELTASONE) 5 MG tablet Take 5 mg by mouth daily. 08/04/20   [provider]  rosuvastatin (CRESTOR) 40 MG tablet Take 1 tablet (40 mg total) by mouth daily at 6 PM. 07/29/19   Wellington Hampshire, MD  sevelamer carbonate (RENVELA) 800 MG tablet Take 3,200 mg by mouth 3 (three) times daily with meals.    [provider]  traZODone (DESYREL) 50 MG tablet TAKE 1/2 -1 TABLET (25-50 MG TOTAL) BY MOUTH AT BEDTIME AS NEEDED FOR SLEEP. 11/10/19   Jeremy Haven, MD  warfarin (COUMADIN) 1 MG tablet Take 1 tablet (1 mg total) by mouth daily. 07/08/20   Jennye Boroughs, MD    Physical Exam: Vitals:   08/12/20 1030 08/12/20 1100 08/12/20 1130 08/12/20 1159  BP: (!) 157/83 (!) 145/83 134/81  Pulse: (!) 106 (!) 110 (!) 111 (!) 114  Resp: (!) 28 (!) 24 (!) 29 (!) 27  Temp:    97.8 F (36.6 C)  TempSrc:    Oral  SpO2: 100%  100% 97% 99%  Weight:       General: Not in acute distress HEENT:       Eyes: PERRL, EOMI, no scleral icterus.       ENT: No discharge from the ears and nose, no pharynx injection, no tonsillar enlargement.        Neck: Positive JVD, no bruit, no mass felt. Heme: No neck lymph node enlargement. Cardiac: S1/S2, irregular heartbeat, mechanical aortic valve sound, no gallops or rubs. Respiratory: No rales, wheezing, rhonchi or rubs. GI: Soft, nondistended, nontender, no rebound pain, no organomegaly, BS present. GU: No hematuria Ext: No pitting leg edema bilaterally. S/p of right BKA. Musculoskeletal: No joint deformities, No joint redness or warmth, no limitation of ROM in spin. Skin: No rashes.  Neuro: Alert, oriented X3, cranial nerves II-XII grossly intact, moves all extremities normally.  Psych: Patient is not psychotic, no suicidal or hemocidal ideation.  Labs on Admission: I have personally reviewed following labs and imaging studies  CBC: Recent Labs  Lab 08/12/20 0946  WBC 13.4*  NEUTROABS 5.9  HGB 12.3*  HCT 42.2  MCV 108.2*  PLT 919*   Basic Metabolic Panel: Recent Labs  Lab 08/12/20 0946  NA 136  K 5.6*  CL 92*  CO2 19*  GLUCOSE 55*  BUN 45*  CREATININE 7.47*  CALCIUM 10.5*   GFR: Estimated Creatinine Clearance: 10.5 mL/min (A) (by C-G formula based on SCr of 7.47 mg/dL (H)). Liver Function Tests: Recent Labs  Lab 08/12/20 0946  AST 105*  ALT 96*  ALKPHOS 179*  BILITOT 2.0*  PROT 7.5  ALBUMIN 3.1*   No results for input(s): LIPASE, AMYLASE in the last 168 hours. No results for input(s): AMMONIA in the last 168 hours. Coagulation Profile: Recent Labs  Lab 08/08/20 0000 08/12/20 0946  INR 4.2* 4.2*   Cardiac Enzymes: No results for input(s): CKTOTAL, CKMB, CKMBINDEX, TROPONINI in the last 168 hours. BNP (last 3 results) No results for input(s): PROBNP in the last 8760 hours. HbA1C: No results for input(s): HGBA1C in the last 72  hours. CBG: No results for input(s): GLUCAP in the last 168 hours. Lipid Profile: No results for input(s): CHOL, HDL, LDLCALC, TRIG, CHOLHDL, LDLDIRECT in the last 72 hours. Thyroid Function Tests: No results for input(s): TSH, T4TOTAL, FREET4, T3FREE, THYROIDAB in the last 72 hours. Anemia Panel: No results for input(s): VITAMINB12, FOLATE, FERRITIN, TIBC, IRON, RETICCTPCT in the last 72 hours. Urine analysis:    Component Value Date/Time   COLORURINE AMBER (A) 07/17/2016 1531   APPEARANCEUR CLOUDY (A) 07/17/2016 1531   LABSPEC 1.030 07/17/2016 1531   PHURINE 8.0 07/17/2016 1531   GLUCOSEU 50 (A) 07/17/2016 1531   HGBUR NEGATIVE 07/17/2016 1531   BILIRUBINUR NEGATIVE 07/17/2016 1531   KETONESUR NEGATIVE 07/17/2016 1531   PROTEINUR 100 (A) 07/17/2016 1531   NITRITE NEGATIVE 07/17/2016 1531   LEUKOCYTESUR NEGATIVE 07/17/2016 1531   Sepsis Labs: _0 (procalcitonin:4,lacticidven:4) ) Recent Results (from the past 240 hour(s))  Resp Panel by RT-PCR (Flu A&B, Covid) Nasopharyngeal Swab     Status: None   Collection Time: 08/12/20  9:46 AM   Specimen: Nasopharyngeal Swab; Nasopharyngeal(NP) swabs in vial transport medium  Result Value Ref Range Status   SARS Coronavirus 2 by RT PCR NEGATIVE NEGATIVE Final  Comment: (NOTE) SARS-CoV-2 target nucleic acids are NOT DETECTED.  The SARS-CoV-2 RNA is generally detectable in upper respiratory specimens during the acute phase of infection. The lowest concentration of SARS-CoV-2 viral copies this assay can detect is 138 copies/mL. A negative result does not preclude SARS-Cov-2 infection and should not be used as the sole basis for treatment or other patient management decisions. A negative result may occur with  improper specimen collection/handling, submission of specimen other than nasopharyngeal swab, presence of viral mutation(s) within the areas targeted by this assay, and inadequate number of viral copies(<138 copies/mL). A  negative result must be combined with clinical observations, patient history, and epidemiological information. The expected result is Negative.  Fact Sheet for Patients:  EntrepreneurPulse.com.au  Fact Sheet for Healthcare Providers:  IncredibleEmployment.be  This test is no t yet approved or cleared by the [place] FDA and  has been authorized for detection and/or diagnosis of SARS-CoV-2 by FDA under an Emergency Use Authorization (EUA). This EUA will remain  in effect (meaning this test can be used) for the duration of the COVID-19 declaration under Section 564(b)(1) of the Act, 21 U.S.C.section 360bbb-3(b)(1), unless the authorization is terminated  or revoked sooner.       Influenza A by PCR NEGATIVE NEGATIVE Final   Influenza B by PCR NEGATIVE NEGATIVE Final    Comment: (NOTE) The Xpert Xpress SARS-CoV-2/FLU/RSV plus assay is intended as an aid in the diagnosis of influenza from Nasopharyngeal swab specimens and should not be used as a sole basis for treatment. Nasal washings and aspirates are unacceptable for Xpert Xpress SARS-CoV-2/FLU/RSV testing.  Fact Sheet for Patients: EntrepreneurPulse.com.au  Fact Sheet for Healthcare Providers: IncredibleEmployment.be  This test is not yet approved or cleared by the [place] FDA and has been authorized for detection and/or diagnosis of SARS-CoV-2 by FDA under an Emergency Use Authorization (EUA). This EUA will remain in effect (meaning this test can be used) for the duration of the COVID-19 declaration under Section 564(b)(1) of the Act, 21 U.S.C. section 360bbb-3(b)(1), unless the authorization is terminated or revoked.  Performed at [facility], [address], [place], [place] [address]      Radiological Exams on Admission: DG Chest Portable 1 View  Result Date: 08/12/2020 CLINICAL DATA:  65 year old male with shortness of  breath EXAM: PORTABLE CHEST 1 VIEW COMPARISON:  Prior chest x-ray 07/18/2020 FINDINGS: Stable cardiomegaly. Increased vascular congestion with mild interstitial edema. Patient is status post median sternotomy with evidence of prior multivessel CABG and aortic valve replacement. Atherosclerotic calcifications present in the transverse aorta. No large effusion or pneumothorax. External defibrillator pad projects over the central chest. No acute osseous abnormality. IMPRESSION: 1. Mild CHF versus volume overload. 2. Cardiomegaly. 3. Aortic atherosclerotic calcifications. Electronically Signed   By: Jacqulynn Cadet M.D.   On: 08/12/2020 10:07     EKG: I have personally reviewed.  Patient seems to have atrial fibrillation with QRS widening versus V. tach, QTC 554, right axis deviation, right bundle blockade tachycardia  Assessment/Plan Principal Problem:   Fluid overload Active Problems:   S/P AVR (aortic valve replacement)   ESRD (end stage renal disease) (HCC)   CAD (coronary artery disease)   Hyperlipidemia   Essential hypertension   OSA (obstructive sleep apnea)   Gout   Atrial fibrillation (HCC)   Hyperkalemia   Elevated troponin   PAD (peripheral artery disease) (HCC)   Anemia in ESRD (end-stage renal disease) (HCC)   Rheumatoid arthritis ([place])   Acute on  chronic systolic CHF (congestive heart failure) (HCC)   Chest pain   Hypoglycemia   Abnormal LFTs   V tach (HCC)   Leucocytosis   Thrombocytopenia (HCC)   Hx of right BKA (HCC)   Fluid overload and acute on chronic systolic CHF: Patient has shortness of breath, positive JVD, pulmonary edema on chest x-ray, clinically consistent with fluid overload and CHF exacerbation.  2D echo on 10/01/2019 showed EF of 45-50%.  Dr. Juleen China of renal is consulted for urgent dialysis.   -Will admit to progressive unit as inpatient -Daily weights -strict I/O's -Low salt diet -Fluid restriction -Obtain REDs Vest reading -Urgent dialysis per  renal  ESRD (end stage renal disease) (MWF): -Renal was consulted for dialysis  S/P AVR (aortic valve replacement): INR 4.4.  Patient taking Coumadin.  Goal for INR is 2.5-3.5 -Coumadin per pharmacy dosing  CAD (coronary artery disease), elevated trop, and chest pain:  S/p of CABG. chest pain has resolved currently.  Troponin 69.  Possibly due to demand ischemia and decreased troponin clearance secondary to ESRD.  Consulted Dr. Acie Fredrickson of cardiology. -Trend troponin - Patient is on Plavix, Crestor, Zetia metoprolol, -As needed nitroglycerin -Check A1c, FLP  Hyperkalemia: Potassium 5.6. -Patient was treated with D50, calcium gluconate, NovoLog 10 units, sodium bicarbonate 50 mEq -Urgent dialysis per renal  Atrial fibrillation with RVR and V tach: HR 140 --> 110s.  Consulted Dr. Acie Fredrickson of cardiology. -continue metoprolol -Patient is on Coumadin -Hyperkalemia was treated  Hyperlipidemia -Zetia, Crestor  Essential hypertension -IV hydralazine as needed -Continue metoprolol  OSA (obstructive sleep apnea) -CPAP  Gout -Allopurinol  PAD (peripheral artery disease) ([place]) -On Plavix  Anemia in ESRD (end-stage renal disease) ([place]): Hemoglobin stable, 12.3 -Continue iron supplement  Rheumatoid arthritis (HCC) -Continue Plaquenil  Hypoglycemia: Blood sugar 55 -As needed D50 -Check CBG every hour  Abnormal LFTs -Check Tylenol level -Check hepatitis panel and HCV antibody -Avoid using Tylenol  Leucocytosis: WBC 13.4.  No source of infection identified. Patient is reluctant to let me open the wound coverage for examination. -Get blood culture -Follow-up with CBC  Thrombocytopenia ([place]): This is chronic issue.  Platelet of 106, no active bleeding -Follow-up with CBC  Hx of right BKA ([place]):  -Wound care consult  Hypercalcemia: Calcium 10.5.  Possibly due to calcium gluconate treatment -Repeat BMP morning          DVT ppx: on coumadin Code Status: Full  code Family Communication: Yes, patient's girlfriend at bed side Disposition Plan:  Anticipate discharge back to previous environment Consults called:  Dr. Acie Fredrickson of card Admission status and Level of care: Progressive Cardiac:    as inpt      Status is: Inpatient  Remains inpatient appropriate because:Inpatient level of care appropriate due to severity of illness   Dispo: The patient is from: Home              Anticipated d/c is to: Home              Patient currently is not medically stable to d/c.   Difficult to place patient No         Date of Service 08/12/2020    Ivor Costa [facility]   If 7PM-7AM, please contact night-coverage [email or web address] 08/12/2020, 12:09 PM

## 2020-08-12 NOTE — ED Provider Notes (Signed)
[facility] Emergency Department Provider Note ____________________________________________   Event Date/Time   First MD Initiated Contact with Patient 08/12/20 [phone]     (approximate)  I have reviewed the triage vital signs and the nursing notes.  HISTORY  Chief Complaint Chest Pain and Shortness of Breath   HPI Jeremy [name] is a 65 y.o. malewho presents to the ED for evaluation of SOB  Chart review indicates history of ESRD s/p nephrectomy due to [place], CAD s/p stenting.  AVR and atrial fibrillation on Coumadin.   Patient presents to the ED via EMS from home for evaluation of generalized feeling unwell and shortness of breath over the past 12 hours.  Prehospital, paramedics transmitted an EKG concerning for V. tach and I recommended to them 1 g of calcium and 150 mg bolus of amiodarone.   Here in the ED, patient reports feeling a little short of breath and tired.  History limited due to acuity of condition and his decreased mentation in the setting of acute illness.  Provide additional empiric calcium with improvement of his rates and narrowing of his QRS.  1 g calcium prehospital, 2 g calcium with [name].  10 units of insulin and D50 provided by [name] as well.   Past Medical History:  Diagnosis Date  . Abnormal stress test 03/19/2018  . 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 Anxiety    situation  . Aortic stenosis    a. 2014 s/p mech AVR ([facility])-->chronic coumadin; b. 08/2018 Echo: Mild AS/AI. Nl fxn/gradient.  . Arthritis   . Atrial fibrillation ([place])   . CAD (coronary artery disease)    a. 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 02/2018 MV ([facility]: large inf, infsept, inflat infarct w/ min rev, EF 30%; c. 06/2018 Cath: LM min irregs, LAD 60p, 95/30/51m, LCX min irregs, RCA 40p, 734m90d, RPAV 90, RPDA 95ost; d. 09/2018 CABGx3 (LIMA->LAD, VG->D1, VG->PDA).  . Chronic anticoagulation 06/2012    a. Coumadin in the setting of mech AVR and afib.  . Complication of anesthesia   . COVID-19 03/15/2019  . CPAP (continuous positive airway pressure) dependence   [facility]   Patient started HD around 1996,  had 1st transplant LLQ around 1998 until 2007 when they found renal cell cancer in transplant and both native kidneys, all were removed > went back on HD until 2nd transplant RLQ in 2014 which lasted 3 yrs; it was embolized for suspected acute rejection. [place]  . ESRD (end stage renal disease) ([place]   a. 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 1999 s/p Kidney transplant; c. 2014 Bilat nephrectomy (transplanted kidney resected) in the setting of renal cell carcinoma; d. PD until 11/2017-->HD.  . Marland Kitcheneart murmur   . HFrEF (heart failure with reduced ejection fraction) ([place]   a. 07/2016 Echo: EF 55-60%; b. 02/2018 MV: EF 30%; c. 04/2018 Echo: EF 35%; d. 08/2018 Echo: EF 30-35%. Glob HK w/o rwma. Mildly reduced RV fxn. Mod to sev dil LA. Nl AoV fxn.  . Marland Kitchenistory of blood transfusion   . Hx of colonoscopy    2009 by Dr. ReLaural GoldenNormal except for small submucosal lipoma. No evidence of polyps or colitis.  . Hyperlipidemia   . Hypertension   . Hypogonadism in male 07/25/2015  . Ischemic cardiomyopathy  a. 07/2016 Echo: EF 55-60%; b. 02/2018 MV: EF 30%; c. 04/2018 Echo: EF 35%; d. 08/2018 Echo: EF 30-35%.  . Myocardial infarction ([place])   . OSA (obstructive sleep apnea)    No CPAP  . Peripheral vascular disease ([place])   . Permanent atrial fibrillation (HCC)    a. CHA2DS2VASc = 3-->Coumadin (also mech AVR); b. 06/2018 Amio started 2/2 elevated rates.  Marland Kitchen PONV (postoperative nausea and vomiting)   . Renal cell cancer ([place])   . Renal cell carcinoma ([place]) 08/04/2016    Patient Active Problem List   Diagnosis Date Noted  . Bacterial infection, unspecified 07/09/2020  . Septic arthritis ([place]) 07/02/2020  . Sepsis ([place])  07/02/2020  . Hypercalcemia 06/12/2020  . Weight loss 05/31/2020  . Bronchitis 05/24/2020  . Postoperative pain   . Rheumatoid arthritis ([place])   . Anemia of chronic disease   . Macrocytosis   . Below-knee amputation of left lower extremity ([place]) 04/23/2020  . Critical lower limb ischemia ([place]) 04/14/2020  . Ischemic neuropathy of right foot 03/20/2020  . Femoral neuropathy of left lower extremity 03/20/2020  . Peripheral vascular disease ([place]) 12/26/2019  . PAD (peripheral artery disease) ([place]) 12/21/2019  . ILD (interstitial lung disease) ([place]) 12/06/2019  . Cough 12/06/2019  . Fatigue 10/19/2019  . Difficulty sleeping 10/19/2019  . Contusion of unspecified thigh, initial encounter 10/11/2019  . Other acute postprocedural pain 10/11/2019  . Demand ischemia ([place])   . Atrial flutter ([place])   . Non-ST elevation (NSTEMI) myocardial infarction ([place])   . Elevated troponin 10/01/2019  . Thigh hematoma 10/01/2019  . Postprocedural hypotension   . Hematoma of thigh, left, initial encounter 09/29/2019  . Unstable angina ([place]) 07/13/2019  . Atherosclerosis of native arteries of the extremities with ulceration ([place]) 07/05/2019  . Foot ulcer ([place]) 05/27/2019  . Skin lesion 11/17/2018  . Complication of vascular access for dialysis 10/01/2018  . S/P CABG x 3 09/16/2018  . Hydrocele 08/16/2018  . IBS (irritable bowel syndrome) 03/19/2018  . Depression 05/17/2017  . AV fistula ([place]) 05/17/2017  . Chronic pain of both knees 05/17/2017  . Arthralgia 05/17/2017  . Osteoarthritis 03/17/2017  . Trigger little finger of right hand 03/17/2017  . Liver disease, unspecified 03/17/2017  . 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 Atrial fibrillation ([place]) 10/30/2016  . Ventral hernia 10/14/2016  . History of renal cell carcinoma 08/04/2016  . Renal transplant failure and rejection 07/31/2016  . Fever 07/17/2016  . Hypogonadism in male 07/25/2015  . Anemia in chronic kidney disease  07/04/2015  . Gout 04/24/2015  . Proteinuria 01/03/2015  . Chronic anticoagulation 11/06/2014  . Iron deficiency anemia 11/02/2014  . Epigastric hernia 02/08/2014  . 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[place]) 08/16/2013  . Vitamin D deficiency 08/16/2013  . Spinal stenosis 06/20/2013  . OSA (obstructive sleep apnea)   . Tremor 12/30/2012  . History of kidney transplant 12/15/2012  . Immunosuppressive management encounter following kidney transplant 12/15/2012  . S/P angioplasty with stent 09/07/2012  . H/O mitral valve replacement with mechanical valve 09/07/2012  . Lumbar radiculopathy 09/06/2012  . ESRD (end stage renal disease) ([place]) 07/26/2012  . Hyperlipidemia 07/26/2012  . S/P AVR (aortic valve replacement) 06/23/2012  . Aortic valve disorder 06/14/2012  . Heart failure ([place]) 05/30/2012  . Thyroid nodule 06/26/2011  . CAD (coronary artery disease) 05/26/2011  . Essential hypertension 05/19/2011    Past Surgical History:  Procedure Laterality Date  . ABDOMINAL AORTOGRAM W/LOWER EXTREMITY N/A 12/22/2019   Procedure:  ABDOMINAL AORTOGRAM W/LOWER EXTREMITY;  Surgeon: Marty Heck, MD;  Location: [place] CV [facility];  Service: Cardiovascular;  Laterality: N/A;  . AMPUTATION Right 04/14/2020   Procedure: RIGHT BELOW KNEE AMPUTATION;  Surgeon: Marty Heck, MD;  Location: [place];  Service: Vascular;  Laterality: Right;  . AORTIC VALVE REPLACEMENT  3 /11 /2014   At [place] Right 07/13/2020   Procedure: APPLICATION OF WOUND VAC;  Surgeon: Marty Heck, MD;  Location: [place];  Service: Vascular;  Laterality: Right;  . BACK SURGERY    . CAPD INSERTION N/A 02/19/2017   Procedure: LAPAROSCOPIC INSERTION CONTINUOUS AMBULATORY PERITONEAL DIALYSIS  (CAPD) CATHETER;  Surgeon: Algernon Huxley, MD;  Location: [facility];  Service: General;  Laterality: N/A;  . CORONARY ANGIOPLASTY WITH STENT PLACEMENT    . CORONARY ARTERY  BYPASS GRAFT N/A 09/16/2018   Procedure: CORONARY ARTERY BYPASS GRAFT times three      with left internal mammary artery and endoharvest of right leg greater saphenous vein;  Surgeon: Melrose Nakayama, MD;  Location: [place];  Service: Open Heart Surgery;  Laterality: N/A;  . CORONARY STENT INTERVENTION N/A 07/15/2019   Procedure: CORONARY STENT INTERVENTION;  Surgeon: Wellington Hampshire, MD;  Location: [place] CV LAB;  Service: Cardiovascular;  Laterality: N/A;  . DG AV DIALYSIS  SHUNT ACCESS EXIST*L* OR     left arm  . EPIGASTRIC HERNIA REPAIR N/A 02/19/2017   Procedure: HERNIA REPAIR EPIGASTRIC ADULT;  Surgeon: Robert Bellow, MD;  Location: [facility];  Service: General;  Laterality: N/A;  . EYE SURGERY     bilateral cataract  . HERNIA REPAIR     UMBILICAL HERNIA  . HERNIA REPAIR  02/19/2017  . INNER EAR SURGERY     20 years ago  . KIDNEY TRANSPLANT    . KNEE ARTHROSCOPY WITH MEDIAL MENISECTOMY Right 07/02/2020   Procedure: KNEE ARTHROSCOPY WITH INCISION AND DRAINAGE;  Surgeon: Leim Fabry, MD;  Location: [facility];  Service: Orthopedics;  Laterality: Right;  . LEFT HEART CATH AND CORS/GRAFTS ANGIOGRAPHY N/A 07/15/2019   Procedure: LEFT HEART CATH AND CORS/GRAFTS ANGIOGRAPHY;  Surgeon: Wellington Hampshire, MD;  Location: [place] CV [facility];  Service: Cardiovascular;  Laterality: N/A;  . LOWER EXTREMITY ANGIOGRAPHY Right 08/25/2019   Procedure: LOWER EXTREMITY ANGIOGRAPHY;  Surgeon: Algernon Huxley, MD;  Location: [place] CV LAB;  Service: Cardiovascular;  Laterality: Right;  . LOWER EXTREMITY ANGIOGRAPHY Left 09/29/2019   Procedure: Lower Extremity Angiography;  Surgeon: Algernon Huxley, MD;  Location: [place] [facility];  Service: Cardiovascular;  Laterality: Left;  . PERIPHERAL VASCULAR INTERVENTION Right 12/22/2019   Procedure: PERIPHERAL VASCULAR INTERVENTION;  Surgeon: Marty Heck, MD;  Location: [place] [facility];  Service: Cardiovascular;  Laterality: Right;  .  Peritoneal Dialysis access placement  02/19/2017  . PSEUDOANERYSM COMPRESSION N/A 09/29/2019   Procedure: PSEUDOANERYSM COMPRESSION;  Surgeon: Algernon Huxley, MD;  Location: [place] CV LAB;  Service: Cardiovascular;  Laterality: N/A;  . REMOVAL OF A DIALYSIS CATHETER N/A 11/23/2017   Procedure: REMOVAL OF A PERITONEAL DIALYSIS CATHETER;  Surgeon: Algernon Huxley, MD;  Location: [facility];  Service: Vascular;  Laterality: N/A;  . RIGHT/LEFT HEART CATH AND CORONARY ANGIOGRAPHY Bilateral 06/07/2018   Procedure: RIGHT/LEFT HEART CATH AND CORONARY ANGIOGRAPHY;  Surgeon: Wellington Hampshire, MD;  Location: [place] [facility];  Service: Cardiovascular;  Laterality: Bilateral;  . TEE WITHOUT CARDIOVERSION N/A  07/21/2016   Procedure: TRANSESOPHAGEAL ECHOCARDIOGRAM (TEE);  Surgeon: Wellington Hampshire, MD;  Location: [facility];  Service: Cardiovascular;  Laterality: N/A;  . TEE WITHOUT CARDIOVERSION N/A 09/16/2018   Procedure: TRANSESOPHAGEAL ECHOCARDIOGRAM (TEE);  Surgeon: Melrose Nakayama, MD;  Location: [place];  Service: Open Heart Surgery;  Laterality: N/A;  . WOUND DEBRIDEMENT Right 07/13/2020   Procedure: RIGHT BELOW KNEE AMPUTATION DEBRIDEMENT WITH [facility];  Surgeon: Marty Heck, MD;  Location: [place];  Service: Vascular;  Laterality: Right;    Prior to Admission medications   Medication Sig Start Date End Date Taking? Authorizing Provider  acetaminophen (TYLENOL) 500 MG tablet Take 1 tablet (500 mg total) by mouth every 4 (four) hours as needed. 05/03/20   Love, Ivan Anchors, PA-C  allopurinol (ZYLOPRIM) 100 MG tablet Take 1 tablet (100 mg total) by mouth daily. 05/03/20   Love, Ivan Anchors, PA-C  Carbomer Gel Base (HYDROGEL) GEL Apply gel to graft site of right lower extremity 08/07/20   Vaughan Basta, Edman Circle, PA-C  CHOLECALCIFEROL PO Take by mouth. 05/14/20 05/13/21  [provider]  cinacalcet (SENSIPAR) 30 MG tablet TAKE 1 TABLET BY MOUTH DAILY WITH FOOD (DO NOT TAKE LESS THAN 12 HOURS  PRIOR TO DIALYSIS) 07/16/20   [provider]  clopidogrel (PLAVIX) 75 MG tablet Take 1 tablet (75 mg total) by mouth daily with breakfast. 07/29/19   Wellington Hampshire, MD  dicyclomine (BENTYL) 20 MG tablet Take 1 tablet (20 mg total) by mouth 2 (two) times daily. 07/18/20   Tedd Sias, PA  ezetimibe (ZETIA) 10 MG tablet TAKE 1 TABLET BY MOUTH EVERY DAY 03/19/20   Rise Mu, PA-C  hydroxychloroquine (PLAQUENIL) 200 MG tablet Take 400 mg by mouth at bedtime. 09/01/19   [provider]  iron sucrose in sodium chloride 0.9 % 100 mL Iron Sucrose (Venofer) 08/01/20 07/24/21  [provider]  lidocaine-prilocaine (EMLA) cream Apply 1 application topically daily as needed (for fistula).    [provider]  loperamide (IMODIUM A-D) 2 MG tablet Take 2 mg by mouth 4 (four) times daily as needed for diarrhea or loose stools.    [provider]  megestrol (MEGACE) 40 MG/ML suspension TAKE 10 ML (400 MG TOTAL) BY MOUTH DAILY. 05/04/20   [provider]  methocarbamol (ROBAXIN) 500 MG tablet Take 1 tablet (500 mg total) by mouth 2 (two) times daily. 07/18/20   Tedd Sias, PA  metoprolol tartrate (LOPRESSOR) 100 MG tablet Take 1 tablet (100 mg total) by mouth 2 (two) times daily. 07/08/20   Jennye Boroughs, MD  midodrine (PROAMATINE) 10 MG tablet TAKE 1 TABLET BY MOUTH 3 TIMES A WEEK AS DIRECTED TAKE 1 TAB BEFORE DIALYSIS MAY REPEAT ONCE AS NEED 06/13/20   [provider]  multivitamin (RENA-VIT) TABS tablet Take 1 tablet by mouth daily. 02/23/19   [provider]  nitroGLYCERIN (NITROSTAT) 0.4 MG SL tablet Place 1 tablet (0.4 mg total) under the tongue every 5 (five) minutes as needed for chest pain. 06/24/19 03/13/20  Loel Dubonnet, NP  oxyCODONE-acetaminophen (PERCOCET/ROXICET) 5-325 MG tablet Take 1 tablet by mouth every 4 (four) hours as needed. 07/13/20   Ulyses Amor, PA-C  pantoprazole (PROTONIX) 40 MG tablet Take 1 tablet (40 mg  total) by mouth daily. 07/29/19   Wellington Hampshire, MD  polyethylene glycol (MIRALAX / GLYCOLAX) 17 g packet Take 17 g by mouth daily as needed for mild constipation. 05/03/20   Reesa Chew  S, PA-C  polyethylene glycol powder (GLYCOLAX/MIRALAX) 17 GM/SCOOP powder Take 17 g by mouth in the morning, at noon, and at bedtime. One scoop in beverage of your choice with each meal. You may increase if you continue to be constipated and decrease if your stool becomes too loose. 07/18/20   Tedd Sias, PA  predniSONE (DELTASONE) 5 MG tablet Take 5 mg by mouth daily. 08/04/20   [provider]  rosuvastatin (CRESTOR) 40 MG tablet Take 1 tablet (40 mg total) by mouth daily at 6 PM. 07/29/19   Wellington Hampshire, MD  sevelamer carbonate (RENVELA) 800 MG tablet Take 3,200 mg by mouth 3 (three) times daily with meals.    [provider]  traZODone (DESYREL) 50 MG tablet TAKE 1/2 -1 TABLET (25-50 MG TOTAL) BY MOUTH AT BEDTIME AS NEEDED FOR SLEEP. 11/10/19   Leone [place], MD  warfarin (COUMADIN) 1 MG tablet Take 1 tablet (1 mg total) by mouth daily. 07/08/20   Jennye Boroughs, MD    Allergies Statins, Gabapentin, and Sertraline  Family History  Problem Relation Age of Onset  . Cancer Mother        pancreatic  . Heart disease Father   . Diabetes Father     Social History Social History   Tobacco Use  . Smoking status: Never Smoker  . Smokeless tobacco: Never Used  Vaping Use  . Vaping Use: Never used  Substance Use Topics  . Alcohol use: Not Currently  . Drug use: No    Review of Systems  Unable to be accurately assessed due to patient's decreased mentation and acuity of condition ____________________________________________   PHYSICAL EXAM:  VITAL SIGNS: Vitals:   08/12/20 0943 08/12/20 1000  BP: 110/80 137/75  Pulse: (!) 145 95  Resp: (!) 28 17  SpO2: 92% 100%     Constitutional: Sleepy.  Slumped forward, but awakens to vocal stimulation and answers questions.   Follows simple commands Eyes: Conjunctivae are normal. PERRL. EOMI. Head: Atraumatic. Nose: No congestion/rhinnorhea. Mouth/Throat: Mucous membranes are dry.  Oropharynx non-erythematous. Neck: No stridor. No cervical spine tenderness to palpation. Cardiovascular: Tachycardic and irregular rhythm.  Respiratory: Tachypneic to the mid 20s.  Faint bibasilar crackles. Gastrointestinal: Soft , nondistended, nontender to palpation. No CVA tenderness. Musculoskeletal: No lower extremity tenderness nor edema.  No joint effusions.  S/p right BKA.  Left foot is cool and dry to the touch Neurologic:  Normal speech and language. No gross focal neurologic deficits are appreciated Skin: Pale and diaphoretic Psychiatric: Mood and affect are normal. Speech and behavior are normal.  ____________________________________________   LABS (all labs ordered are listed, but only abnormal results are displayed)  Labs Reviewed  COMPREHENSIVE METABOLIC PANEL - Abnormal; Notable for the following components:      Result Value   Potassium 5.6 (*)    Chloride 92 (*)    CO2 19 (*)    Glucose, Bld 55 (*)    BUN 45 (*)    Creatinine, Ser 7.47 (*)    Calcium 10.5 (*)    Albumin 3.1 (*)    AST 105 (*)    ALT 96 (*)    Alkaline Phosphatase 179 (*)    Total Bilirubin 2.0 (*)    GFR, Estimated 8 (*)    Anion gap 25 (*)    All other components within normal limits  CBC WITH DIFFERENTIAL/PLATELET - Abnormal; Notable for the following components:   WBC 13.4 (*)    RBC 3.90 (*)  Hemoglobin 12.3 (*)    MCV 108.2 (*)    MCHC 29.1 (*)    RDW 18.0 (*)    Platelets 106 (*)    nRBC 0.4 (*)    Lymphs Abs 6.5 (*)    All other components within normal limits  TROPONIN I (HIGH SENSITIVITY) - Abnormal; Notable for the following components:   Troponin I (High Sensitivity) 69 (*)    All other components within normal limits  RESP PANEL BY RT-PCR (FLU A&B, COVID) ARPGX2  PROTIME-INR    ____________________________________________  12 Lead EKG  Multiple twelve-lead EKGs performed over about 20 minutes demonstrates initial wide-complex tachycardia, rate of 140 bpm, and consistent with V. tach, and irregular, concerning for hyperkalemia.  After temporization measures, twelve-lead EKG demonstrates atrial fibrillation, rate of 96 bpm.  Right bundle and left anterior fascicular blocks with otherwise appropriate intervals.  No evidence of STEMI ____________________________________________  RADIOLOGY  ED MD interpretation: 1 view CXR reviewed by me with cardiomegaly and mild pulmonary vascular congestion  Official radiology report(s): DG Chest Portable 1 View  Result Date: 08/12/2020 CLINICAL DATA:  65 year old male with shortness of breath EXAM: PORTABLE CHEST 1 VIEW COMPARISON:  Prior chest x-ray 07/18/2020 FINDINGS: Stable cardiomegaly. Increased vascular congestion with mild interstitial edema. Patient is status post median sternotomy with evidence of prior multivessel CABG and aortic valve replacement. Atherosclerotic calcifications present in the transverse aorta. No large effusion or pneumothorax. External defibrillator pad projects over the central chest. No acute osseous abnormality. IMPRESSION: 1. Mild CHF versus volume overload. 2. Cardiomegaly. 3. Aortic atherosclerotic calcifications. Electronically Signed   By: Jacqulynn Cadet M.D.   On: 08/12/2020 10:07    ____________________________________________   PROCEDURES and INTERVENTIONS  Procedure(s) performed (including Critical Care):  .1-3 Lead EKG Interpretation Performed by: Vladimir Crofts, MD Authorized by: Vladimir Crofts, MD     Interpretation: abnormal     ECG rate:  140   ECG rate assessment: tachycardic     Rhythm: other rhythm     Ectopy: none     Conduction: normal   .Critical Care Performed by: Vladimir Crofts, MD Authorized by: Vladimir Crofts, MD   Critical care provider statement:    Critical  care time (minutes):  45   Critical care was necessary to treat or prevent imminent or life-threatening deterioration of the following conditions:  Cardiac failure, circulatory failure and metabolic crisis   Critical care was time spent personally by me on the following activities:  Discussions with consultants, evaluation of patient's response to treatment, examination of patient, ordering and performing treatments and interventions, ordering and review of laboratory studies, ordering and review of radiographic studies, pulse oximetry, re-evaluation of patient's condition, obtaining history from patient or surrogate and review of old charts    Medications  insulin aspart (novoLOG) injection 10 Units (10 Units Intravenous Given 08/12/20 1001)  dextrose 50 % solution 50 mL (50 mLs Intravenous Given 08/12/20 1001)  calcium chloride injection 2 g (2 g Intravenous Given 08/12/20 0959)    ____________________________________________   MDM / ED COURSE   Dialysis patient presents from home via EMS with shortness of breath associated with a wide-complex tachycardia, found to have severe hyperkalemia as the cause, requiring urgent hemodialysis in the ED.  Remains hemodynamically stable throughout, but presents in a wide-complex tachycardia initially concerning for V. Tach.  There is some irregularity to this and more narrow complex rhythm in V2, make me think this is more atrial fibrillation with aberrancy.  As empiric hyperkalemic measures  are provided, his QRS narrows up and his rate slows, so we will continue this method until return to atrial fibrillation with rate in the 90s.  Remained stable throughout this process.  We did not have to continue any amiodarone in the ED.  Blood work returns with a K of 5.6.  Troponin is slightly elevated, but I see no STEMI on his follow-up EKGs and I doubt ACS, this is likely demand ischemia in the setting of his tachycardia.  CXR without infiltrates and demonstrates mild  volume overload.  Discussed the case with nephrology, who agrees to initiate dialysis in the ED.  Admitted to hospitalist thereafter  Clinical Course as of 08/12/20 1042  Sun Aug 12, 2020  0951 QRS narrowing on telemetry after getting additional calcium.  Now have 2 g of calcium with [name], 1 g prehospital, and his rate is slowly improving as well down into the 120s.  Repeat EKG performed. [DS]  [phone] Continues to narrow and he clinically looks a little bit better with improved color.  He reports feeling a little bit better 2.  Rates now 100s-110s.  I called for diltiazem, which we will hold in the room, and continue treatment for hyperkalemia.  Ordered insulin and glucose [DS]  1004 Continued improvement.  Rate now in the 90s and QRS is narrow quite a bit on most recent EKG.  Clinically improving.  Still stable. [DS]  73 Nephro paged [DS]  1008 Spoke with Dr. Juleen China who agrees to all in the [facility] team for this patient. Requests hospitalist admit [DS]    Clinical Course User Index [DS] Vladimir Crofts, MD    ____________________________________________   FINAL CLINICAL IMPRESSION(S) / ED DIAGNOSES  Final diagnoses:  Hyperkalemia  Wide-complex tachycardia [facility])     ED Discharge Orders    None       Ja Ohman   Note:  This document was prepared using Dragon voice recognition software and may include unintentional dictation errors.   Vladimir Crofts, MD 08/12/20 1045

## 2020-08-12 NOTE — ED Notes (Signed)
1 gm calcium IVP

## 2020-08-12 NOTE — ED Notes (Signed)
Pt finished with dialysis at this time. CBG recheked with a result of 109

## 2020-08-12 NOTE — Progress Notes (Signed)
Patient arrived to unit in no distress. VSS 

## 2020-08-12 NOTE — ED Notes (Signed)
Pt presents to ED via EMS from home with c/o of feeling SOB, EKG showed VTACH. Pt placed on cardiac monitor, 2 IV's placed, MD at bedside, EKG obtained, pt is in and out of it but is arsouable to light touch and pain.   Pt is a dialysis pt and had a full TX on Friday per EMS.

## 2020-08-13 ENCOUNTER — Inpatient Hospital Stay: Admit: 2020-08-13 | Payer: Medicare Other

## 2020-08-13 ENCOUNTER — Encounter: Payer: Self-pay | Admitting: Internal Medicine

## 2020-08-13 DIAGNOSIS — M069 Rheumatoid arthritis, unspecified: Secondary | ICD-10-CM

## 2020-08-13 DIAGNOSIS — I472 Ventricular tachycardia: Secondary | ICD-10-CM

## 2020-08-13 DIAGNOSIS — J811 Chronic pulmonary edema: Secondary | ICD-10-CM

## 2020-08-13 DIAGNOSIS — Z992 Dependence on renal dialysis: Secondary | ICD-10-CM | POA: Diagnosis not present

## 2020-08-13 DIAGNOSIS — T8610 Unspecified complication of kidney transplant: Secondary | ICD-10-CM | POA: Diagnosis not present

## 2020-08-13 DIAGNOSIS — I25118 Atherosclerotic heart disease of native coronary artery with other forms of angina pectoris: Secondary | ICD-10-CM

## 2020-08-13 DIAGNOSIS — I739 Peripheral vascular disease, unspecified: Secondary | ICD-10-CM

## 2020-08-13 DIAGNOSIS — Z89511 Acquired absence of right leg below knee: Secondary | ICD-10-CM

## 2020-08-13 DIAGNOSIS — I248 Other forms of acute ischemic heart disease: Secondary | ICD-10-CM

## 2020-08-13 DIAGNOSIS — N186 End stage renal disease: Secondary | ICD-10-CM | POA: Diagnosis not present

## 2020-08-13 LAB — CBC
HCT: 38.9 % — ABNORMAL LOW (ref 39.0–52.0)
Hemoglobin: 11.8 g/dL — ABNORMAL LOW (ref 13.0–17.0)
MCH: 31.1 pg (ref 26.0–34.0)
MCHC: 30.3 g/dL (ref 30.0–36.0)
MCV: 102.4 fL — ABNORMAL HIGH (ref 80.0–100.0)
Platelets: 91 10*3/uL — ABNORMAL LOW (ref 150–400)
RBC: 3.8 MIL/uL — ABNORMAL LOW (ref 4.22–5.81)
RDW: 17.6 % — ABNORMAL HIGH (ref 11.5–15.5)
WBC: 6.7 10*3/uL (ref 4.0–10.5)
nRBC: 0 % (ref 0.0–0.2)

## 2020-08-13 LAB — BASIC METABOLIC PANEL
Anion gap: 14 (ref 5–15)
BUN: 38 mg/dL — ABNORMAL HIGH (ref 8–23)
CO2: 29 mmol/L (ref 22–32)
Calcium: 9.3 mg/dL (ref 8.9–10.3)
Chloride: 94 mmol/L — ABNORMAL LOW (ref 98–111)
Creatinine, Ser: 5.76 mg/dL — ABNORMAL HIGH (ref 0.61–1.24)
GFR, Estimated: 10 mL/min — ABNORMAL LOW (ref >60)
Glucose, Bld: 72 mg/dL (ref 70–99)
Potassium: 4.5 mmol/L (ref 3.5–5.1)
Sodium: 137 mmol/L (ref 135–145)

## 2020-08-13 LAB — PROTIME-INR
INR: 4.5 (ref 0.8–1.2)
Prothrombin Time: 42.5 seconds — ABNORMAL HIGH (ref 11.4–15.2)

## 2020-08-13 LAB — HEMOGLOBIN A1C
Hgb A1c MFr Bld: 5 % (ref 4.8–5.6)
Mean Plasma Glucose: 96.8 mg/dL

## 2020-08-13 LAB — LIPID PANEL
Cholesterol: 59 mg/dL (ref 0–200)
HDL: 21 mg/dL — ABNORMAL LOW (ref >40)
LDL Cholesterol: 26 mg/dL (ref 0–99)
Total CHOL/HDL Ratio: 2.8 RATIO
Triglycerides: 62 mg/dL (ref <150)
VLDL: 12 mg/dL (ref 0–40)

## 2020-08-13 MED ORDER — SIMETHICONE 80 MG PO CHEW
80.0000 mg | CHEWABLE_TABLET | Freq: Four times a day (QID) | ORAL | Status: DC | PRN
  Administered 2020-08-14: 80 mg via ORAL
  Filled 2020-08-13 (×4): qty 1

## 2020-08-13 NOTE — Consult Note (Signed)
ANTICOAGULATION CONSULT NOTE - Initial Consult  Pharmacy Consult for warfarin Indication: s/p of AVR with mechanical valve  Allergies  Allergen Reactions  . Statins Other (See Comments)    Arthralgias (intolerance), Myalgias (intolerance)  Pt taking pravastatin   . Gabapentin     Drowsy   . Sertraline Rash    Patient Measurements: Height: 5\' 10"  (177.8 cm) Weight: 70.9 kg (156 lb 4.9 oz) IBW/kg (Calculated) : 73   Vital Signs: Temp: 98.4 F (36.9 C) (05/09 1043) Temp Source: Oral (05/09 1043) BP: 89/67 (05/09 1043) Pulse Rate: 114 (05/09 1043)  Labs: Recent Labs    08/12/20 0946 08/12/20 1145 08/12/20 2149 08/13/20 0510  HGB 12.3*  --   --  11.8*  HCT 42.2  --   --  38.9*  PLT 106*  --   --  91*  LABPROT 40.7*  --   --  42.5*  INR 4.2*  --   --  4.5*  CREATININE 7.47*  --   --  5.76*  TROPONINIHS 69* 82* 182*  --     Estimated Creatinine Clearance: 13.2 mL/min (A) (by C-G formula based on SCr of 5.76 mg/dL (H)).   Medical History: Past Medical History:  Diagnosis Date  . Abnormal stress test 03/19/2018  . 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 Anxiety    situation  . Aortic stenosis    a. 2014 s/p mech AVR ([facility])-->chronic coumadin; b. 08/2018 Echo: Mild AS/AI. Nl fxn/gradient.  . Arthritis   . CAD (coronary artery disease)    a. 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 02/2018 MV [facility]): large inf, infsept, inflat infarct w/ min rev, EF 30%; c. 06/2018 Cath: LM min irregs, LAD 60p, 95/30/86m, LCX min irregs, RCA 40p, 66m, 90d, RPAV 90, RPDA 95ost; d. 09/2018 CABGx3 (LIMA->LAD, VG->D1, VG->PDA).  . Chronic anticoagulation 06/2012   a. Coumadin in the setting of mech AVR and afib.  . Complication of anesthesia   . COVID-19 03/15/2019  . CPAP (continuous positive airway pressure) dependence   . Dialysis patient [facility]) 04/07/2006   Patient started HD around 1996,  had 1st transplant LLQ around  1998 until 2007 when they found renal cell cancer in transplant and both native kidneys, all were removed > went back on HD until 2nd transplant RLQ in 2014 which lasted 3 yrs; it was embolized for suspected acute rejection. Is currently on HD MWF in [place] w/ [facility] nephrology.  . ESRD (end stage renal disease) ([place])    a. CKD 2/2 to IgA nephropathy (dx age 79); b. 1999 s/p Kidney transplant; c. 2014 Bilat nephrectomy (transplanted kidney resected) in the setting of renal cell carcinoma; d. PD until 11/2017-->HD.  Marland Kitchen Heart murmur   . HFrEF (heart failure with reduced ejection fraction) ([place])    a. 07/2016 Echo: EF 55-60%; b. 02/2018 MV: EF 30%; c. 04/2018 Echo: EF 35%; d. 08/2018 Echo: EF 30-35%. Glob HK w/o rwma. Mildly reduced RV fxn. Mod to sev dil LA. Nl AoV fxn.  Marland Kitchen History of blood transfusion   . Hx of colonoscopy    2009 by Dr. Laural Golden. Normal except for small submucosal lipoma. No evidence of polyps or colitis.  . Hyperlipidemia   . Hypertension   . Hypogonadism in male 07/25/2015  . Ischemic cardiomyopathy    a. 07/2016 Echo: EF 55-60%; b. 02/2018 MV: EF 30%; c. 04/2018 Echo: EF 35%; d. 08/2018  Echo: EF 30-35%.  . Myocardial infarction ([place])   . OSA (obstructive sleep apnea)    No CPAP  . Peripheral vascular disease ([place])   . Permanent atrial fibrillation ([facility])    a. CHA2DS2VASc = 3-->Coumadin (also mech AVR); b. 06/2018 Amio started 2/2 elevated rates.  Marland Kitchen PONV (postoperative nausea and vomiting)   . Renal cell cancer ([place])   . Renal cell carcinoma ([place]) 08/04/2016    Medications:  PTA: Warfarin 1 mg daily, Friday dose may change depending on reading per chart review.   No DDI concerns at this time  Assessment: 65 yo M presenting with chest pain and shortness of breath. 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 Pharmacy has been consulted to dose and monitor warfarin.   Goal of Therapy:  INR 2.5-3.5  (per chart  review) Monitor platelets by anticoagulation protocol: Yes   Date INR Dose 5/8 4.2  Hold 5/9 4.5 Hold  Plan:  Hold warfarin dose today due to elevated INR  Of note, patient received 1 time dose of amiodarone(unsure of strength) during transportation to hospital(may be affecting INR level) Recheck INR and CBC with AM labs  Pearla Dubonnet, PharmD Pharmacy Resident  08/13/2020 12:59 PM

## 2020-08-13 NOTE — Consult Note (Addendum)
[facility] Nurse Consult Note: Reason for Consult: Consult requested for right BKA.  Pt states he is followed as an outpatient by the surgeon and has serial debridements performed. Wound type: Chronic full thickness wound; 4X10cm, 100% dry black eschar with is tightly adhered. No odor, drainage, or fluctuance. Dressing procedure/placement/frequency: Attempted to keep topical treatment in close alignment with the plan of care pt was following prior to admission: Topical treatment orders provided for bedside nurses to perform as follows: Bedside nurse; please change dressing to right BKA Q Mon and Thurs as follows:   Apply Mepitel nonadherent dressing, then cover with xeroform and abd pad then kerlex and stocking and paper tape.  Pt states he plans to continue follow-up with his surgeon after discharge for further assessment/sharp debridement. Please re-consult if further assistance is needed.  Thank-you,  Julien Girt MSN, [place], [place], [place], [place]

## 2020-08-13 NOTE — Progress Notes (Signed)
Critical lab INR 4.5 Reported to Dr Tana Coast at this time

## 2020-08-13 NOTE — Progress Notes (Signed)
[facility]  ROUNDING NOTE   Subjective:   [name] was admitted to [facility] on 08/12/2020 for Hyperkalemia [E87.5] Wide-complex tachycardia ([place]) [I47.2] Fluid overload [E87.70]  Last hemodialysis treatment was 5/6. Patient completed his dialysis treatment. Patient was at his estimated dry weight at the end of treatment, 70.5 Kg  Patient was getting more short of breath since yesterday. He has been more weak. He denies any changes to his diet but states he is constipated.   Patient with V tach on the field. Was given IV amiodarone and 1 g of calcium. He was then given 2 g of calcium, insulin and dextrose when brought to the ED. Follow up K of 5.6.   Patient seen during dialysis today   HEMODIALYSIS FLOWSHEET:  Blood Flow Rate (mL/min): 0 mL/min Arterial Pressure (mmHg): -210 mmHg Venous Pressure (mmHg): 190 mmHg Transmembrane Pressure (mmHg): 60 mmHg Ultrafiltration Rate (mL/min): 1400 mL/min Dialysate Flow Rate (mL/min): 600 ml/min Conductivity: Machine : 13.8 Conductivity: Machine : 13.8 Dialysis Fluid Bolus: Normal Saline Bolus Amount (mL): 300 mL  Resting comfortable Per HD tech, BP low and patient asymptomatic   Objective:  Vital signs in last 24 hours:  Temp:  [97.8 F (36.6 C)-98.4 F (36.9 C)] 98.4 F (36.9 C) (05/09 0742) Pulse Rate:  [110-117] 110 (05/09 0742) Resp:  [14-31] 16 (05/09 0742) BP: (107-176)/(69-99) 112/84 (05/09 0742) SpO2:  [98 %-100 %] 100 % (05/09 0742) Weight:  [70.9 kg] 70.9 kg (05/09 0406)  Weight change:  Filed Weights   08/12/20 0942 08/12/20 2137 08/13/20 0406  Weight: 75.6 kg 70.9 kg 70.9 kg    Intake/Output: I/O last 3 completed shifts: In: 120 [P.O.:120] Out: 3000 [Other:3000]   Intake/Output this shift:  Total I/O In: 240 [P.O.:240] Out: -   Physical Exam: General: NAD, resting in bed  Head: Normocephalic, atraumatic. Moist oral mucosal membranes  Eyes: Anicteric  Lungs:  Diminished bilaterally   Heart: Irregular, +murmur  Abdomen:  Soft, nontender,   Extremities:  no edema, Right BKA with clean dressings.   Neurologic: Nonfocal, moving all four extremities  Skin: No lesions  Access: Left AVF    Basic Metabolic Panel: Recent Labs  Lab 08/12/20 0946 08/13/20 0510  NA 136 137  K 5.6* 4.5  CL 92* 94*  CO2 19* 29  GLUCOSE 55* 72  BUN 45* 38*  CREATININE 7.47* 5.76*  CALCIUM 10.5* 9.3    Liver Function Tests: Recent Labs  Lab 08/12/20 0946  AST 105*  ALT 96*  ALKPHOS 179*  BILITOT 2.0*  PROT 7.5  ALBUMIN 3.1*   No results for input(s): LIPASE, AMYLASE in the last 168 hours. No results for input(s): AMMONIA in the last 168 hours.  CBC: Recent Labs  Lab 08/12/20 0946 08/13/20 0510  WBC 13.4* 6.7  NEUTROABS 5.9  --   HGB 12.3* 11.8*  HCT 42.2 38.9*  MCV 108.2* 102.4*  PLT 106* 91*    Cardiac Enzymes: No results for input(s): CKTOTAL, CKMB, CKMBINDEX, TROPONINI in the last 168 hours.  BNP: Invalid input(s): POCBNP  CBG: Recent Labs  Lab 08/12/20 1244 08/12/20 1300 08/12/20 1627 08/12/20 1915  GLUCAP 23* 143* 109* 102*    Microbiology: Results for orders placed or performed during the hospital encounter of 08/12/20  Resp Panel by RT-PCR (Flu A&B, Covid) Nasopharyngeal Swab     Status: None   Collection Time: 08/12/20  9:46 AM   Specimen: Nasopharyngeal Swab; Nasopharyngeal(NP) swabs in vial transport medium  Result Value  Ref Range Status   SARS Coronavirus 2 by RT PCR NEGATIVE NEGATIVE Final    Comment: (NOTE) SARS-CoV-2 target nucleic acids are NOT DETECTED.  The SARS-CoV-2 RNA is generally detectable in upper respiratory specimens during the acute phase of infection. The lowest concentration of SARS-CoV-2 viral copies this assay can detect is 138 copies/mL. A negative result does not preclude SARS-Cov-2 infection and should not be used as the sole basis for treatment or other patient management decisions. A negative result may occur  with  improper specimen collection/handling, submission of specimen other than nasopharyngeal swab, presence of viral mutation(s) within the areas targeted by this assay, and inadequate number of viral copies(<138 copies/mL). A negative result must be combined with clinical observations, patient history, and epidemiological information. The expected result is Negative.  Fact Sheet for Patients:  EntrepreneurPulse.com.au  Fact Sheet for Healthcare Providers:  IncredibleEmployment.be  This test is no t yet approved or cleared by the [place] FDA and  has been authorized for detection and/or diagnosis of SARS-CoV-2 by FDA under an Emergency Use Authorization (EUA). This EUA will remain  in effect (meaning this test can be used) for the duration of the COVID-19 declaration under Section 564(b)(1) of the Act, 21 U.S.C.section 360bbb-3(b)(1), unless the authorization is terminated  or revoked sooner.       Influenza A by PCR NEGATIVE NEGATIVE Final   Influenza B by PCR NEGATIVE NEGATIVE Final    Comment: (NOTE) The Xpert Xpress SARS-CoV-2/FLU/RSV plus assay is intended as an aid in the diagnosis of influenza from Nasopharyngeal swab specimens and should not be used as a sole basis for treatment. Nasal washings and aspirates are unacceptable for Xpert Xpress SARS-CoV-2/FLU/RSV testing.  Fact Sheet for Patients: EntrepreneurPulse.com.au  Fact Sheet for Healthcare Providers: IncredibleEmployment.be  This test is not yet approved or cleared by the [place] FDA and has been authorized for detection and/or diagnosis of SARS-CoV-2 by FDA under an Emergency Use Authorization (EUA). This EUA will remain in effect (meaning this test can be used) for the duration of the COVID-19 declaration under Section 564(b)(1) of the Act, 21 U.S.C. section 360bbb-3(b)(1), unless the authorization is terminated  or revoked.  Performed at [facility], [address] [place], [place] [address]   Culture, blood (Routine X 2) w Reflex to ID Panel     Status: None (Preliminary result)   Collection Time: 08/12/20  9:49 PM   Specimen: BLOOD  Result Value Ref Range Status   Specimen Description BLOOD BLOOD RIGHT FOREARM  Final   Special Requests   Final    BOTTLES DRAWN AEROBIC AND ANAEROBIC Blood Culture adequate volume   Culture   Final    NO GROWTH < 12 HOURS Performed at [facility], [address], [place], [place] [address]    Report Status PENDING  Incomplete  Culture, blood (Routine X 2) w Reflex to ID Panel     Status: None (Preliminary result)   Collection Time: 08/12/20  9:50 PM   Specimen: BLOOD  Result Value Ref Range Status   Specimen Description BLOOD  RIGHT HAND  Final   Special Requests   Final    BOTTLES DRAWN AEROBIC AND ANAEROBIC Blood Culture adequate volume   Culture   Final    NO GROWTH < 12 HOURS Performed at [facility], [address] [place], [place] [address]    Report Status PENDING  Incomplete   *Note: Due to a large number of results and/or encounters  for the requested time period, some results have not been displayed. A complete set of results can be found in Results Review.    Coagulation Studies: Recent Labs    08/12/20 0946 08/13/20 0510  LABPROT 40.7* 42.5*  INR 4.2* 4.5*    Urinalysis: No results for input(s): COLORURINE, LABSPEC, PHURINE, GLUCOSEU, HGBUR, BILIRUBINUR, KETONESUR, PROTEINUR, UROBILINOGEN, NITRITE, LEUKOCYTESUR in the last 72 hours.  Invalid input(s): APPERANCEUR    Imaging: DG Chest Portable 1 View  Result Date: 08/12/2020 CLINICAL DATA:  65 year old male with shortness of breath EXAM: PORTABLE CHEST 1 VIEW COMPARISON:  Prior chest x-ray 07/18/2020 FINDINGS: Stable cardiomegaly. Increased vascular congestion with mild interstitial edema. Patient is status post median sternotomy with evidence of  prior multivessel CABG and aortic valve replacement. Atherosclerotic calcifications present in the transverse aorta. No large effusion or pneumothorax. External defibrillator pad projects over the central chest. No acute osseous abnormality. IMPRESSION: 1. Mild CHF versus volume overload. 2. Cardiomegaly. 3. Aortic atherosclerotic calcifications. Electronically Signed   By: Jacqulynn Cadet M.D.   On: 08/12/2020 10:07     Medications:   . sodium chloride     . allopurinol  100 mg Oral Daily  . cinacalcet  30 mg Oral Q supper  . clopidogrel  75 mg Oral Q breakfast  . ezetimibe  10 mg Oral Daily  . hydroxychloroquine  400 mg Oral QHS  . loratadine  10 mg Oral Daily  . metoprolol tartrate  100 mg Oral BID  . midodrine  10 mg Oral Q M,W,F-HD  . multivitamin  1 tablet Oral Daily  . pantoprazole  40 mg Oral Daily  . rosuvastatin  40 mg Oral q1800  . sevelamer carbonate  3,200 mg Oral TID WC  . sodium chloride flush  3 mL Intravenous Q12H  . Warfarin - Pharmacist Dosing Inpatient   Does not apply q1600   sodium chloride, albuterol, dextromethorphan-guaiFENesin, dextrose, hydrALAZINE, hydrOXYzine, lidocaine-prilocaine, loperamide, nitroGLYCERIN, oxyCODONE-acetaminophen, phenylephrine, polyethylene glycol, simethicone, sodium chloride flush, traZODone  Assessment/ Plan:  [name] is a 65 y.o. 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 End Stage Renal Disease with fluid overload and  hyperkalemia -Scheduled for dialysis today - Instructed HD team to maintain BP map >60, no UF - Next treatment scheduled for Wednesday   2. Hypertension:  - midodrine before dialysis treatments.  - Will monitor during treatments  3. Anemia of chronic kidney disease: hemoglobin 11.8, Macrocytic. Retacrit as outpatient.  Remains at an acceptable range  4. Secondary Hyperparathyroidism:  - sevelamer with meals - cinacalcet.    LOS: 1 Dontea Corlew 5/9/202211:32 AM

## 2020-08-13 NOTE — Progress Notes (Signed)
[facility]                                                                              Patient Demographics  Jeremy [name], is a 65 y.o. male, DOB - February 11, 1956, TDD:[id number]  Admit date - 08/12/2020   Admitting Physician Ivor Costa, MD  Outpatient Primary MD for the patient is Caryl Bis Angela Adam, MD  Outpatient specialists:   LOS - 1  days   Medical records reviewed and are as summarized below:    Chief Complaint  Patient presents with  . Chest Pain  . Shortness of Breath       Brief summary   Patient is a 65 year old male with ESRD-HD, MWF status post mechanical AVR on Coumadin, atrial fibrillation, hypertension, hyperlipidemia, gout, RA, thrombocytopenia, OSA on CPAP, CAD, CABG, systolic CHF , PVD, anemia, renal cell CA, failed kidney transplantation presented with shortness of breath.  Patient reported that his shortness of breath progressively worsened the day before the admission, had dry cough but no fevers or chills.  Also reported central chest pain which resolved in ED. EMS also noted EKG concerning for V. tach and patient was given 1 g calcium and 150 mg bolus of amiodarone. Of note, patient had right BKA recently and his wound is healing and healing process  In ED, O2 sats 92% on room air 100% on 2 L, chest x-ray showed cardiomegaly, volume overload.  Patient was admitted for further work-up.  Nephrology was consulted for urgent hemodialysis.   Assessment & Plan    Principal Problem:   Fluid overload, acute on chronic systolic CHF with underlying history of ESRD on hemodialysis, Monday Wednesday Friday -Patient presented with shortness of breath, positive JVD, pulmonary edema on chest x-ray clinically consistent with fluid overload and CHF exacerbation. -2D echo 09/2019 had shown EF of 45 to 50%. -Nephrology consulted and patient underwent emergent hemodialysis on 5/8, resuming hemodialysis  per his schedule today again.  Active Problems: Atrial  flutter with RVR versus V. Tach -Unclear as the trigger for fluid overload due to heart arrhythmia -Patient received 1 g calcium and amiodarone 150 mg was by EMS -EKGs reviewed with cardiology, Dr. Rockey Situ, will discussed with the EP, Dr.Taylor, may need amiodarone drip.  Will defer to cardiology recommendations. -Continue Coumadin per pharmacy, INR currently supratherapeutic, 4.5   Demand ischemia with elevated troponin likely due to fluid overload, acute on chronic systolic CHF and arrhythmia -Cardiology following, -Continue Plavix, Crestor, Zetia, metoprolol -Lipid panel showed LDL 26    S/P mechanical AVR (aortic valve replacement) -Continue Coumadin per pharmacy -Currently INR supratherapeutic, 4.5  Hyperkalemia -Potassium 5.6 on admission, was treated with D50, calcium gluconate, NovoLog 10 units, sodium bicarb -Underwent urgent hemodialysis -Potassium improved to 4.5  Anemia, normocytic likely due to anemia of chronic disease/ESRD -H&H currently at baseline -Baseline hemoglobin ~9  Gout -Continue allopurinol  PAD -Continue Plavix  Hypoglycemia -Received to D50, continue as needed  Leukocytosis -On admission 13.4, improved to 6.7, no fevers -Likely reactive.   Thrombocytopenia -Monitor counts closely, platelets 91K  Elevated LFTs -Follow closely, hepatitis panel negative -Avoid Tylenol.  If continues to worsen, will  hold statin  Code Status: Full code DVT Prophylaxis:  Coumadin   Level of Care: Level of care: Progressive Cardiac Family Communication: Discussed all imaging results, lab results, explained to the patient    Disposition Plan:     Status is: Inpatient  Remains inpatient appropriate because:Inpatient level of care appropriate due to severity of illness   Dispo: The patient is from: Home              Anticipated d/c is to: Home              Patient currently is not medically stable to d/c.  Work-up in progress for Aflutter RVR, needing HD     Difficult to place patient No      Time Spent in minutes   35 minutes  Procedures:  None  Consultants:   Cardiology Nephrology  Antimicrobials:   Anti-infectives (From admission, onward)   Start     Dose/Rate Route Frequency Ordered Stop   08/12/20 2200  hydroxychloroquine (PLAQUENIL) tablet 400 mg        400 mg Oral Daily at bedtime 08/12/20 1917            Medications  Scheduled Meds: . allopurinol  100 mg Oral Daily  . cinacalcet  30 mg Oral Q supper  . clopidogrel  75 mg Oral Q breakfast  . ezetimibe  10 mg Oral Daily  . hydroxychloroquine  400 mg Oral QHS  . loratadine  10 mg Oral Daily  . metoprolol tartrate  100 mg Oral BID  . midodrine  10 mg Oral Q M,W,F-HD  . multivitamin  1 tablet Oral Daily  . pantoprazole  40 mg Oral Daily  . rosuvastatin  40 mg Oral q1800  . sevelamer carbonate  3,200 mg Oral TID WC  . sodium chloride flush  3 mL Intravenous Q12H  . Warfarin - Pharmacist Dosing Inpatient   Does not apply q1600   Continuous Infusions: . sodium chloride     PRN Meds:.sodium chloride, albuterol, dextromethorphan-guaiFENesin, dextrose, hydrALAZINE, hydrOXYzine, lidocaine-prilocaine, loperamide, nitroGLYCERIN, oxyCODONE-acetaminophen, phenylephrine, polyethylene glycol, simethicone, sodium chloride flush, traZODone      Subjective:   Jeremy [name] was seen and examined today.  Feeling better after undergoing urgent hemodialysis.  States he has been getting into fluid overload more often.  Patientdenies dizziness, chest pain,  abdominal pain, N/V/D/C, new weakness, numbess, tingling.   Objective:   Vitals:   08/13/20 0406 08/13/20 0514 08/13/20 0742 08/13/20 1043  BP:  107/77 112/84 (!) 89/67  Pulse:  (!) 110 (!) 110 (!) 114  Resp:  18 16 16   Temp:  98.2 F (36.8 C) 98.4 F (36.9 C) 98.4 F (36.9 C)  TempSrc:   Oral Oral  SpO2:  100% 100%   Weight: 70.9 kg     Height:        Intake/Output Summary (Last 24 hours) at 08/13/2020 1145 Last  data filed at 08/13/2020 1024 Gross per 24 hour  Intake 360 ml  Output 3000 ml  Net -2640 ml     Wt Readings from Last 3 Encounters:  08/13/20 70.9 kg  08/07/20 70.3 kg  07/31/20 71.5 kg     Exam  General: Alert and oriented x 3, NAD  Cardiovascular: S1 S2 auscultated, irregularly irregular, mechanical aortic sound  Respiratory: Diminished breath sounds at the base  Gastrointestinal: Soft, nontender, nondistended, + bowel sounds  Ext: status post right BKA   Neuro: no new deficits  Musculoskeletal: No digital cyanosis, clubbing  Skin: No rashes  Psych: Normal affect and demeanor, alert and oriented x3    Data Reviewed:  I have personally reviewed following labs and imaging studies  Micro Results Recent Results (from the past 240 hour(s))  Resp Panel by RT-PCR (Flu A&B, Covid) Nasopharyngeal Swab     Status: None   Collection Time: 08/12/20  9:46 AM   Specimen: Nasopharyngeal Swab; Nasopharyngeal(NP) swabs in vial transport medium  Result Value Ref Range Status   SARS Coronavirus 2 by RT PCR NEGATIVE NEGATIVE Final    Comment: (NOTE) SARS-CoV-2 target nucleic acids are NOT DETECTED.  The SARS-CoV-2 RNA is generally detectable in upper respiratory specimens during the acute phase of infection. The lowest concentration of SARS-CoV-2 viral copies this assay can detect is 138 copies/mL. A negative result does not preclude SARS-Cov-2 infection and should not be used as the sole basis for treatment or other patient management decisions. A negative result may occur with  improper specimen collection/handling, submission of specimen other than nasopharyngeal swab, presence of viral mutation(s) within the areas targeted by this assay, and inadequate number of viral copies(<138 copies/mL). A negative result must be combined with clinical observations, patient history, and epidemiological information. The expected result is Negative.  Fact Sheet for Patients:   EntrepreneurPulse.com.au  Fact Sheet for Healthcare Providers:  IncredibleEmployment.be  This test is no t yet approved or cleared by the [place] FDA and  has been authorized for detection and/or diagnosis of SARS-CoV-2 by FDA under an Emergency Use Authorization (EUA). This EUA will remain  in effect (meaning this test can be used) for the duration of the COVID-19 declaration under Section 564(b)(1) of the Act, 21 U.S.C.section 360bbb-3(b)(1), unless the authorization is terminated  or revoked sooner.       Influenza A by PCR NEGATIVE NEGATIVE Final   Influenza B by PCR NEGATIVE NEGATIVE Final    Comment: (NOTE) The Xpert Xpress SARS-CoV-2/FLU/RSV plus assay is intended as an aid in the diagnosis of influenza from Nasopharyngeal swab specimens and should not be used as a sole basis for treatment. Nasal washings and aspirates are unacceptable for Xpert Xpress SARS-CoV-2/FLU/RSV testing.  Fact Sheet for Patients: EntrepreneurPulse.com.au  Fact Sheet for Healthcare Providers: IncredibleEmployment.be  This test is not yet approved or cleared by the [place] FDA and has been authorized for detection and/or diagnosis of SARS-CoV-2 by FDA under an Emergency Use Authorization (EUA). This EUA will remain in effect (meaning this test can be used) for the duration of the COVID-19 declaration under Section 564(b)(1) of the Act, 21 U.S.C. section 360bbb-3(b)(1), unless the authorization is terminated or revoked.  Performed at [facility], [address] [place], [place] [address]   Culture, blood (Routine X 2) w Reflex to ID Panel     Status: None (Preliminary result)   Collection Time: 08/12/20  9:49 PM   Specimen: BLOOD  Result Value Ref Range Status   Specimen Description BLOOD BLOOD RIGHT FOREARM  Final   Special Requests   Final    BOTTLES DRAWN AEROBIC AND ANAEROBIC Blood Culture  adequate volume   Culture   Final    NO GROWTH < 12 HOURS Performed at [facility], [address], [place], [place] [address]    Report Status PENDING  Incomplete  Culture, blood (Routine X 2) w Reflex to ID Panel     Status: None (Preliminary result)   Collection Time: 08/12/20  9:50 PM   Specimen: BLOOD  Result Value Ref Range  Status   Specimen Description BLOOD  RIGHT HAND  Final   Special Requests   Final    BOTTLES DRAWN AEROBIC AND ANAEROBIC Blood Culture adequate volume   Culture   Final    NO GROWTH < 12 HOURS Performed at [facility], [address], [place], [place] [address]    Report Status PENDING  Incomplete    Radiology Reports CT ABDOMEN PELVIS WO CONTRAST  Result Date: 07/18/2020 CLINICAL DATA:  65 year old with suprapubic pain. History of renal cell carcinoma. EXAM: CT ABDOMEN AND PELVIS WITHOUT CONTRAST TECHNIQUE: Multidetector CT imaging of the abdomen and pelvis was performed following the standard protocol without IV contrast. COMPARISON:  03/06/2020 and chest CT 04/19/2020 FINDINGS: Lower chest: Trace right pleural fluid. Heart is enlarged. Coronary artery calcifications. Hepatobiliary: High-density material layering in the gallbladder is similar to the previous examination and could represent small stones and/or sludge. Small amount of perihepatic ascites. Pancreas: No evidence for acute inflammation or significant duct dilatation. However, there is a new low-density structure along the medial aspect of the pancreatic head and uncinate process on sequence 3, image 38 that measures 1.4 cm. Hounsfield units of the structure is 1.4. This is suggestive for a small cystic structure which was not clearly present on the previous examination. Spleen: Normal in size without focal abnormality. Adrenals/Urinary Tract: No gross abnormality to the adrenal glands. Evidence for bilateral nephrectomies. Urinary bladder is decompressed. There is a transplant  kidney in the right lower quadrant with extensive parenchymal calcifications and multiple endovascular coils near the hilum. Stomach/Bowel: Normal appendix. No evidence for bowel obstruction or acute bowel inflammation. Vascular/Lymphatic: Extensive atherosclerotic calcifications throughout the abdomen and pelvis. Negative for an abdominal aortic aneurysm. No significant lymph node enlargement in the abdomen or pelvis. Reproductive: Prostate is unremarkable. Other: Mild presacral edema which is new. Trace perihepatic ascites. Musculoskeletal: Diffuse sclerosis throughout the bones compatible with chronic kidney disease. Disc space narrowing at L5-S1. mild subcutaneous edema. IMPRESSION: 1. Trace right pleural fluid with trace perihepatic ascites. New presacral edema. Findings could be associated with mild fluid overload state. 2. New 1.4 cm low-density collection near the pancreatic head and uncinate process. This could represent a small cyst but incompletely characterized. Based on history renal cell carcinoma, consider 3 month follow-up MRI to further characterize this area. 3. Bilateral nephrectomies with a transplant kidney. 4. Extensive atherosclerotic disease. Aortic Atherosclerosis (ICD10-I70.0). 5. Cholelithiasis.  No significant gallbladder distension. Electronically Signed   By: Markus Daft M.D.   On: 07/18/2020 17:44   DG Chest 2 View  Result Date: 07/18/2020 CLINICAL DATA:  Left basilar crackles, afebrile, likely atelectasis. EXAM: CHEST - 2 VIEW COMPARISON:  Radiograph 07/02/2020. FINDINGS: Post median sternotomy and CABG. Chronic cardiomegaly unchanged. Peribronchial thickening. Mild septal thickening at the lung bases, progression on the right. Trace fluid in the right minor fissure with small sub pulmonic effusions. No confluent consolidation. No pneumothorax. Remote right posterior sixth rib fracture. IMPRESSION: 1. Slight increase in septal thickening at the lung bases suspicious for pulmonary  edema. Small pleural effusions as well as trace fluid in the right minor fissure. 2. Chronic cardiomegaly post CABG. Aortic Atherosclerosis (ICD10-I70.0). Electronically Signed   By: Keith Rake M.D.   On: 07/18/2020 16:55   DG Chest Portable 1 View  Result Date: 08/12/2020 CLINICAL DATA:  65 year old male with shortness of breath EXAM: PORTABLE CHEST 1 VIEW COMPARISON:  Prior chest x-ray 07/18/2020 FINDINGS: Stable cardiomegaly. Increased vascular congestion with mild interstitial edema. Patient is status  post median sternotomy with evidence of prior multivessel CABG and aortic valve replacement. Atherosclerotic calcifications present in the transverse aorta. No large effusion or pneumothorax. External defibrillator pad projects over the central chest. No acute osseous abnormality. IMPRESSION: 1. Mild CHF versus volume overload. 2. Cardiomegaly. 3. Aortic atherosclerotic calcifications. Electronically Signed   By: Jacqulynn Cadet M.D.   On: 08/12/2020 10:07    Lab Data:  CBC: Recent Labs  Lab 08/12/20 0946 08/13/20 0510  WBC 13.4* 6.7  NEUTROABS 5.9  --   HGB 12.3* 11.8*  HCT 42.2 38.9*  MCV 108.2* 102.4*  PLT 106* 91*   Basic Metabolic Panel: Recent Labs  Lab 08/12/20 0946 08/13/20 0510  NA 136 137  K 5.6* 4.5  CL 92* 94*  CO2 19* 29  GLUCOSE 55* 72  BUN 45* 38*  CREATININE 7.47* 5.76*  CALCIUM 10.5* 9.3   GFR: Estimated Creatinine Clearance: 13.2 mL/min (A) (by C-G formula based on SCr of 5.76 mg/dL (H)). Liver Function Tests: Recent Labs  Lab 08/12/20 0946  AST 105*  ALT 96*  ALKPHOS 179*  BILITOT 2.0*  PROT 7.5  ALBUMIN 3.1*   No results for input(s): LIPASE, AMYLASE in the last 168 hours. No results for input(s): AMMONIA in the last 168 hours. Coagulation Profile: Recent Labs  Lab 08/08/20 0000 08/12/20 0946 08/13/20 0510  INR 4.2* 4.2* 4.5*   Cardiac Enzymes: No results for input(s): CKTOTAL, CKMB, CKMBINDEX, TROPONINI in the last 168  hours. BNP (last 3 results) No results for input(s): PROBNP in the last 8760 hours. HbA1C: Recent Labs    08/13/20 0510  HGBA1C 5.0   CBG: Recent Labs  Lab 08/12/20 1244 08/12/20 1300 08/12/20 1627 08/12/20 1915  GLUCAP 23* 143* 109* 102*   Lipid Profile: Recent Labs    08/13/20 0510  CHOL 59  HDL 21*  LDLCALC 26  TRIG 62  CHOLHDL 2.8   Thyroid Function Tests: No results for input(s): TSH, T4TOTAL, FREET4, T3FREE, THYROIDAB in the last 72 hours. Anemia Panel: No results for input(s): VITAMINB12, FOLATE, FERRITIN, TIBC, IRON, RETICCTPCT in the last 72 hours. Urine analysis:    Component Value Date/Time   COLORURINE AMBER (A) 07/17/2016 1531   APPEARANCEUR CLOUDY (A) 07/17/2016 1531   LABSPEC 1.030 07/17/2016 1531   PHURINE 8.0 07/17/2016 1531   GLUCOSEU 50 (A) 07/17/2016 1531   HGBUR NEGATIVE 07/17/2016 1531   BILIRUBINUR NEGATIVE 07/17/2016 1531   KETONESUR NEGATIVE 07/17/2016 1531   PROTEINUR 100 (A) 07/17/2016 1531   NITRITE NEGATIVE 07/17/2016 1531   LEUKOCYTESUR NEGATIVE 07/17/2016 1531     Jeremy [name] M.D. [facility] 08/13/2020, 11:45 AM  Available via Epic secure chat 7am-7pm After 7 pm, please refer to night coverage provider listed on amion.

## 2020-08-13 NOTE — Progress Notes (Signed)
PT Cancellation Note  Patient Details Name: JAAMAL FAROOQUI MRN: [id number] DOB: 19-Feb-1956   Cancelled Treatment:    Reason Eval/Treat Not Completed: Other (comment);Patient not medically ready PT orders received, chart reviewed. Pt noted to have critically high INR (4.5) & awaiting cardiology consult (pt with elevated resting HR in 110s). Will hold PT evaluation until cardiology consult & pending medical stability & appropriateness of intervention.  Lavone Nian, PT, DPT 08/13/20, 7:45 AM    Waunita Schooner 08/13/2020, 7:44 AM

## 2020-08-13 NOTE — Progress Notes (Signed)
Spoke with Raquel Sarna at 1002 in [place] to switch patient to room 206-1 from room 249. Per doctor UF off and ONLY cleaning blood today due to low BP and high heart rate.

## 2020-08-13 NOTE — Consult Note (Addendum)
Cardiology Consult    Patient ID: Jeremy [name] MRN: [id number], DOB/AGE: Jan 17, 1956   Admit date: 08/12/2020 Date of Consult: 08/13/2020  Primary Physician: Leone Haven, MD Primary Cardiologist: Kathlyn Sacramento, MD Requesting Provider: Tyler Pita, MD  Patient Profile    Jeremy [name] is a 65 y.o. 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 Tana Coast.  Past Medical History   Past Medical History:  Diagnosis Date  . Abnormal stress test 03/19/2018  . 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 Anxiety    situation  . Aortic stenosis    a. 2014 s/p mech AVR ([facility])-->chronic coumadin; b. 08/2018 Echo: Mild AS/AI. Nl fxn/gradient; c. 09/2019 Echo: EF 45-50%, AoV mean grad 71mHg.  . Arthritis   . CAD (coronary artery disease)    a. 2013 PCI: LAD [id number] Xience DES); b. 06/2018 Cath: LM min irregs, LAD 60p, 95/30/9m, LCX min irregs, RCA 40p, 7029m0d, RPAV 90, RPDA 95ost; d. 09/2018 CABGx3 (LIMA->LAD, VG->D1, VG->PDA); e. 07/2019 PCI: LAD 90p, 5m34mm 28m 9m, 29mills via L->L collats, LCX mild diff dzs, RCA 40p, 34m, 914mRPAV 99, RPDA 95ost, VG->D2 100, LIMA->LAD ok, VG->RPDA 99 (4.5x15 Resolute Onyx DES).  . Chronic anticoagulation 06/2012   a. Coumadin in the setting of mech AVR and afib.  .  Complication of anesthesia   . COVID-19 03/15/2019  . CPAP (continuous positive airway pressure) dependence   . Dialysis patient (HCC) [facility]   Patient started HD around 1996,  had 1st transplant LLQ around 1998 until 2007 when they found renal cell cancer in transplant and both native kidneys, all were removed > went back on HD until 2nd transplant RLQ in 2014 which lasted 3 yrs; it was embolized for suspected acute rejection. [facility]  . ESRD (end stage renal disease) (HCC)   [place] CKD 2/2 to IgA nephropathy (dx age 70); b.[id number] s/p Kidney transplant; c. 2014 Bilat nephrectomy (transplanted kidney resected) in the setting of renal cell carcinoma; d. PD until 11/2017-->HD.  . HeartMarland Kitchenmurmur   . HFrEF (heart failure with reduced ejection fraction) (HCC)   [place] 07/2016 Echo: EF 55-60%; b. 02/2018 MV: EF 30%; c. 04/2018 Echo: EF 35%; d. 08/2018 Echo: EF 30-35%. Glob HK w/o rwma; e. 09/2019 Echo: EF 45-50%, mod LVH, basal-mid inf/inflat HK, nl RV fxn, mod dil LA, mildly dil RA. Nl AoV prosthesis (mean grad 11mmHg)32m History of blood transfusion   . Hx of colonoscopy    2009 by Dr. Rehman. Laural Golden except for small submucosal lipoma. No evidence of polyps or colitis.  . Hyperlipidemia   . Hypertension   . Hypogonadism in male 07/25/2015  . Ischemic cardiomyopathy    a. 07/2016 Echo: EF 55-60%; b. 02/2018 MV: EF 30%; c. 04/2018 Echo: EF 35%; d. 08/2018 Echo: EF 30-35%; e.  09/2019 Echo: EF 45-50%, mod LVH, basal-mid inf/inflat HK.  . Myocardial infarction ([place])   . OSA (obstructive sleep apnea)    No CPAP  . Paroxysmal atrial flutter ([place])   . Peripheral vascular disease ([place])    a. 09/2019 s/p L SFA and L profunda stenting; b. 12/2019 s/p R PT and TP trunk PTA; c. 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  Marland Kitchen Permanent atrial fibrillation (HCC)    a. CHA2DS2VASc = 3-->Coumadin (also mech AVR); b. 06/2018 Amio started 2/2 elevated rates.  Marland Kitchen PONV (postoperative nausea and vomiting)   .  Renal cell carcinoma ([place]) 08/04/2016    Past Surgical History:  Procedure Laterality Date  . ABDOMINAL AORTOGRAM W/LOWER EXTREMITY N/A 12/22/2019   Procedure: ABDOMINAL AORTOGRAM W/LOWER EXTREMITY;  Surgeon: Marty Heck, MD;  Location: [place] [facility];  Service: Cardiovascular;  Laterality: N/A;  . AMPUTATION Right 04/14/2020   Procedure: RIGHT BELOW KNEE AMPUTATION;  Surgeon: Marty Heck, MD;  Location: [place];  Service: Vascular;  Laterality: Right;  . AORTIC VALVE REPLACEMENT  3 /11 /2014   At [place] Right 07/13/2020   Procedure: APPLICATION OF WOUND VAC;  Surgeon: Marty Heck, MD;  Location: [place];  Service: Vascular;  Laterality: Right;  . BACK SURGERY    . CAPD INSERTION N/A 02/19/2017   Procedure: LAPAROSCOPIC INSERTION CONTINUOUS AMBULATORY PERITONEAL DIALYSIS  (CAPD) CATHETER;  Surgeon: Algernon Huxley, MD;  Location: [facility];  Service: General;  Laterality: N/A;  . CORONARY ANGIOPLASTY WITH STENT PLACEMENT    . CORONARY ARTERY BYPASS GRAFT N/A 09/16/2018   Procedure: CORONARY ARTERY BYPASS GRAFT times three      with left internal mammary artery and endoharvest of right leg greater saphenous vein;  Surgeon: Melrose Nakayama, MD;  Location: [place];  Service: Open Heart Surgery;  Laterality: N/A;  . CORONARY STENT INTERVENTION N/A 07/15/2019   Procedure: CORONARY STENT INTERVENTION;  Surgeon: Wellington Hampshire, MD;  Location: [place] [facility];  Service: Cardiovascular;  Laterality: N/A;  . DG AV DIALYSIS  SHUNT ACCESS EXIST*L* OR     left arm  . EPIGASTRIC HERNIA REPAIR N/A 02/19/2017   Procedure: HERNIA REPAIR EPIGASTRIC ADULT;  Surgeon: Robert Bellow, MD;  Location: [facility];  Service: General;  Laterality: N/A;  . EYE SURGERY     bilateral cataract  . HERNIA REPAIR     UMBILICAL HERNIA  . HERNIA REPAIR  02/19/2017  . INNER EAR SURGERY     20 years ago  . KIDNEY TRANSPLANT    . KNEE  ARTHROSCOPY WITH MEDIAL MENISECTOMY Right 07/02/2020   Procedure: KNEE ARTHROSCOPY WITH INCISION AND DRAINAGE;  Surgeon: Leim Fabry, MD;  Location: [facility];  Service: Orthopedics;  Laterality: Right;  . LEFT HEART CATH AND CORS/GRAFTS ANGIOGRAPHY N/A 07/15/2019   Procedure: LEFT HEART CATH AND CORS/GRAFTS ANGIOGRAPHY;  Surgeon: Wellington Hampshire, MD;  Location: [place] [facility];  Service: Cardiovascular;  Laterality: N/A;  . LOWER EXTREMITY ANGIOGRAPHY Right 08/25/2019   Procedure: LOWER EXTREMITY ANGIOGRAPHY;  Surgeon: Algernon Huxley, MD;  Location: [place] [facility];  Service: Cardiovascular;  Laterality: Right;  . LOWER EXTREMITY ANGIOGRAPHY Left 09/29/2019   Procedure: Lower Extremity Angiography;  Surgeon: Algernon Huxley, MD;  Location: [place] [facility];  Service: Cardiovascular;  Laterality: Left;  . PERIPHERAL VASCULAR INTERVENTION Right 12/22/2019   Procedure: PERIPHERAL VASCULAR INTERVENTION;  Surgeon: Marty Heck, MD;  Location:  [place] [facility];  Service: Cardiovascular;  Laterality: Right;  . Peritoneal Dialysis access placement  02/19/2017  . PSEUDOANERYSM COMPRESSION N/A 09/29/2019   Procedure: PSEUDOANERYSM COMPRESSION;  Surgeon: Algernon Huxley, MD;  Location: [place] [facility];  Service: Cardiovascular;  Laterality: N/A;  . REMOVAL OF A DIALYSIS CATHETER N/A 11/23/2017   Procedure: REMOVAL OF A PERITONEAL DIALYSIS CATHETER;  Surgeon: Algernon Huxley, MD;  Location: [facility];  Service: Vascular;  Laterality: N/A;  . RIGHT/LEFT HEART CATH AND CORONARY ANGIOGRAPHY Bilateral 06/07/2018   Procedure: RIGHT/LEFT HEART CATH AND CORONARY ANGIOGRAPHY;  Surgeon: Wellington Hampshire, MD;  Location: [place] [facility];  Service: Cardiovascular;  Laterality: Bilateral;  . TEE WITHOUT CARDIOVERSION N/A 07/21/2016   Procedure: TRANSESOPHAGEAL ECHOCARDIOGRAM (TEE);  Surgeon: Wellington Hampshire, MD;  Location: [facility];  Service: Cardiovascular;  Laterality: N/A;  . TEE WITHOUT CARDIOVERSION N/A  09/16/2018   Procedure: TRANSESOPHAGEAL ECHOCARDIOGRAM (TEE);  Surgeon: Melrose Nakayama, MD;  Location: [place];  Service: Open Heart Surgery;  Laterality: N/A;  . WOUND DEBRIDEMENT Right 07/13/2020   Procedure: RIGHT BELOW KNEE AMPUTATION DEBRIDEMENT WITH MYRI[facility]IN [facility];  Surgeon: Marty Heck, MD;  Location: [place];  Service: Vascular;  Laterality: Right;     Allergies  Allergies  Allergen Reactions  . Statins Other (See Comments)    Arthralgias (intolerance), Myalgias (intolerance)  Pt taking pravastatin   . Gabapentin     Drowsy   . 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  CAD history dates back to 2013, at which time he underwent LAD stenting.  The following year, he underwent aortic valve replacement with a mechanical aortic valve and has been on warfarin ever since.  In November 2019, he underwent stress testing at [facility] secondary to progressive angina and this showed a large inferior and inferolateral scar with minimal reversibility.  EF was depressed at 30% and this was subsequently confirmed by echocardiogram.  In March 2020, diagnostic catheterization showed severe multivessel disease and he subsequently underwent CABG x3 with a LIMA to the LAD, vein graft to the diagonal, and vein graft to the RPDA.  He had recurrent angina in April 2021 and underwent diagnostic catheterization revealing severe disease in the vein  graft to the RPDA with an occlusion of the vein graft to the diagonal.  The diagonal was fed via left to left collaterals.  Previously placed LAD stent was patent.  Underwent successful PCI undergoing stent placement to the vein graft to the PDA.  He subsequently required right lower extremity percutaneous interventions secondary to severe PAD and in January of this year, underwent right BKA.  He required I&D of the right knee in late March and this was complicated by wound infection requiring IV antibiotics.  He subsequently underwent debridement on April 8.  Since his BKA, patient has been more or less wheelchair-bound.  He does not usually experience chest pain, though activity is quite limited.  He notes that over the past several months, he experiences a brief episode of chest pressure, usually while  getting into bed, lasting just a few seconds, resolving with 2 deep breaths.  He notes compliance with his medications and hemodialysis.  Over the past month, he has been feeling more fatigued than previously.  He notes that he seems to have more energy on dialysis days, when he takes his blood pressure medicines after dialysis, instead of early in the morning.  Over the past 2 to 3 days, he has been experiencing increasing orthopnea and on the evening of May 8, he had worsening orthopnea associated with chest pressure.  He notes that he felt very anxious and just could not ease his mind.  He had his girlfriend called EMS and he was found to be in a wide-complex tachycardia @ 140 bpm.    He was given 1 g of calcium and 150 mg bolus of IV amiodarone in the field and transported to the ED.  Here, he was found to have leukocytosis with a white count of 13.4.  Troponin was mildly elevated at 69.  Potassium was elevated at 5.6.  LFTs were abnormal with an alkaline phosphatase of 179, AST 105, ALT 96, bilirubin 2.0.  Heart rate dropped from the 140s to the 110s.  Chest x-ray showed cardiomegaly and volume overload.   Patient was admitted and nephrology consulted for urgent dialysis in the setting of mild hyperkalemia.  Since admission, patient has been stable and less orthopneic.  Troponin has risen mildly to 182.  He denies chest pain or dyspnea presently.  Inpatient Medications    . allopurinol  100 mg Oral Daily  . cinacalcet  30 mg Oral Q supper  . clopidogrel  75 mg Oral Q breakfast  . ezetimibe  10 mg Oral Daily  . hydroxychloroquine  400 mg Oral QHS  . loratadine  10 mg Oral Daily  . metoprolol tartrate  100 mg Oral BID  . midodrine  10 mg Oral Q M,W,F-HD  . multivitamin  1 tablet Oral Daily  . pantoprazole  40 mg Oral Daily  . rosuvastatin  40 mg Oral q1800  . sevelamer carbonate  3,200 mg Oral TID WC  . sodium chloride flush  3 mL Intravenous Q12H  . Warfarin - Pharmacist Dosing Inpatient   Does not apply q52    Family History    Family History  Problem Relation Age of Onset  . Cancer Mother        pancreatic  . Heart disease Father   . Diabetes Father    He indicated that his mother is deceased. He indicated that his father is deceased. He indicated that the status of his sister is unknown and reported the following: health unknown. He indicated that the status of his brother is unknown and reported the following: unknown. He indicated that his daughter is alive. He indicated that his other is alive.   Social History    Social History   Socioeconomic History  . Marital status: Divorced    Spouse name: Not on file  . Number of children: 1  . Years of education: 22  . Highest education level: High school graduate  Occupational History  . Occupation: retired Forensic scientist -Disabled    Comment: Psychologist, educational  Tobacco Use  . Smoking status: Never Smoker  . Smokeless tobacco: Never Used  Vaping Use  . Vaping Use: Never used  Substance and Sexual Activity  . Alcohol use: Not Currently  . Drug use: No  . Sexual activity: Not Currently    Partners: Female  Other Topics  Concern  . Not on file  Social History Narrative   Lives at home with girlfriend   1 daughter 44    Right handed    Social Determinants of Health   Financial Resource Strain: Not on file  Food Insecurity: Not on file  Transportation Needs: Not on file  Physical Activity: Not on file  Stress: Not on file  Social Connections: Not on file  Intimate Partner Violence: Not on file     Review of Systems    General:  No chills, fever, night sweats or weight changes.  Cardiovascular:  No chest pain, dyspnea on exertion, edema, orthopnea, palpitations, paroxysmal nocturnal dyspnea. Dermatological: No rash, lesions/masses Respiratory: No cough, dyspnea Urologic: No hematuria, dysuria Abdominal:   No nausea, vomiting, diarrhea, bright red blood per rectum, melena, or hematemesis Neurologic:  No visual changes, wkns, changes in mental status. All other systems reviewed and are otherwise negative except as noted above.  Physical Exam    Blood pressure 107/74, pulse (!) 115, temperature 98.4 F (36.9 C), temperature source Oral, resp. rate 13, height _0  (1.778 m), weight 70.9 kg, SpO2 100 %.  General: Pleasant, NAD Psych: Normal affect. Neuro: Alert and oriented X 3. Moves all extremities spontaneously. HEENT: Normal  Neck: Supple without bruits or JVD. Lungs:  Resp regular and unlabored, CTA. Heart: RRR, tachy, 2/6 syst murmur @ LLSB.  No s3, s4. Abdomen: Soft, non-tender, non-distended, BS + x 4.  Extremities: No clubbing, cyanosis or edema. R BKA.  Trace L DP/PT.  Labs    Cardiac Enzymes Recent Labs  Lab 08/12/20 0946 08/12/20 1145 08/12/20 2149  TROPONINIHS 69* 82* 182*      Lab Results  Component Value Date   WBC 6.7 08/13/2020   HGB 11.8 (L) 08/13/2020   HCT 38.9 (L) 08/13/2020   MCV 102.4 (H) 08/13/2020   PLT 91 (L) 08/13/2020    Recent Labs  Lab 08/12/20 0946 08/13/20 0510  NA 136 137  K 5.6* 4.5  CL 92* 94*  CO2 19* 29  BUN 45* 38*  CREATININE  7.47* 5.76*  CALCIUM 10.5* 9.3  PROT 7.5  --   BILITOT 2.0*  --   ALKPHOS 179*  --   ALT 96*  --   AST 105*  --   GLUCOSE 55* 72   Lab Results  Component Value Date   CHOL 59 08/13/2020   HDL 21 (L) 08/13/2020   LDLCALC 26 08/13/2020   TRIG 62 08/13/2020    Radiology Studies    CT ABDOMEN PELVIS WO CONTRAST  Result Date: 07/18/2020 CLINICAL DATA:  65 year old with suprapubic pain. History of renal cell carcinoma. EXAM: CT ABDOMEN AND PELVIS WITHOUT CONTRAST TECHNIQUE: Multidetector CT imaging of the abdomen and pelvis was performed following the standard protocol without IV contrast. COMPARISON:  03/06/2020 and chest CT 04/19/2020 FINDINGS: Lower chest: Trace right pleural fluid. Heart is enlarged. Coronary artery calcifications. Hepatobiliary: High-density material layering in the gallbladder is similar to the previous examination and could represent small stones and/or sludge. Small amount of perihepatic ascites. Pancreas: No evidence for acute inflammation or significant duct dilatation. However, there is a new low-density structure along the medial aspect of the pancreatic head and uncinate process on sequence 3, image 38 that measures 1.4 cm. Hounsfield units of the structure is 1.4. This is suggestive for a small cystic structure which was not clearly present on the previous examination. Spleen: Normal in size without focal abnormality. Adrenals/Urinary Tract: No gross abnormality to  the adrenal glands. Evidence for bilateral nephrectomies. Urinary bladder is decompressed. There is a transplant kidney in the right lower quadrant with extensive parenchymal calcifications and multiple endovascular coils near the hilum. Stomach/Bowel: Normal appendix. No evidence for bowel obstruction or acute bowel inflammation. Vascular/Lymphatic: Extensive atherosclerotic calcifications throughout the abdomen and pelvis. Negative for an abdominal aortic aneurysm. No significant lymph node enlargement in the  abdomen or pelvis. Reproductive: Prostate is unremarkable. Other: Mild presacral edema which is new. Trace perihepatic ascites. Musculoskeletal: Diffuse sclerosis throughout the bones compatible with chronic kidney disease. Disc space narrowing at L5-S1. mild subcutaneous edema. IMPRESSION: 1. Trace right pleural fluid with trace perihepatic ascites. New presacral edema. Findings could be associated with mild fluid overload state. 2. New 1.4 cm low-density collection near the pancreatic head and uncinate process. This could represent a small cyst but incompletely characterized. Based on history renal cell carcinoma, consider 3 month follow-up MRI to further characterize this area. 3. Bilateral nephrectomies with a transplant kidney. 4. Extensive atherosclerotic disease. Aortic Atherosclerosis (ICD10-I70.0). 5. Cholelithiasis.  No significant gallbladder distension. Electronically Signed   By: Markus Daft M.D.   On: 07/18/2020 17:44   DG Chest 2 View  Result Date: 07/18/2020 CLINICAL DATA:  Left basilar crackles, afebrile, likely atelectasis. EXAM: CHEST - 2 VIEW COMPARISON:  Radiograph 07/02/2020. FINDINGS: Post median sternotomy and CABG. Chronic cardiomegaly unchanged. Peribronchial thickening. Mild septal thickening at the lung bases, progression on the right. Trace fluid in the right minor fissure with small sub pulmonic effusions. No confluent consolidation. No pneumothorax. Remote right posterior sixth rib fracture. IMPRESSION: 1. Slight increase in septal thickening at the lung bases suspicious for pulmonary edema. Small pleural effusions as well as trace fluid in the right minor fissure. 2. Chronic cardiomegaly post CABG. Aortic Atherosclerosis (ICD10-I70.0). Electronically Signed   By: Keith Rake M.D.   On: 07/18/2020 16:55   DG Chest Portable 1 View  Result Date: 08/12/2020 CLINICAL DATA:  65 year old male with shortness of breath EXAM: PORTABLE CHEST 1 VIEW COMPARISON:  Prior chest x-ray  07/18/2020 FINDINGS: Stable cardiomegaly. Increased vascular congestion with mild interstitial edema. Patient is status post median sternotomy with evidence of prior multivessel CABG and aortic valve replacement. Atherosclerotic calcifications present in the transverse aorta. No large effusion or pneumothorax. External defibrillator pad projects over the central chest. No acute osseous abnormality. IMPRESSION: 1. Mild CHF versus volume overload. 2. Cardiomegaly. 3. Aortic atherosclerotic calcifications. Electronically Signed   By: Jacqulynn Cadet M.D.   On: 08/12/2020 10:07    ECG & Cardiac Imaging    Wide-complex tachycardia-likely underlying atrial fibrillation, 140, right axis deviation, right bundle branch block- personally reviewed.  Assessment & Plan    1.  Wide-complex tachycardia: Patient with a known history of permanent A. fib with paroxysmal atrial flutter, who presented to the ED via EMS on the evening of May 8 secondary to complaints of orthopnea and was found to have a wide-complex tachycardia with rates into the 140s.  He was initially treated with calcium and IV amiodarone.  Potassium was found to be 5.6 and he was treated with urgent dialysis.  Follow-up ECG this morning shows narrowing of complex with known right bundle branch block.  Since admission, he has had persistent atrial flutter, more or less locked-in and 114 bpm.  Based on history and review of ECGs, it appears that widening of his complex occurred in the setting of hypokalemia and underlying right bundle branch block.  He has had mild troponin elevation to  a peak of 182.  Echocardiogram has been ordered and is pending.  Will consider additional ischemic evaluation pending EF.  Continue beta-blocker therapy.  2.  Coronary artery disease/demand ischemia: Patient with a history of CAD status post CABG x3 and subsequent stenting of the vein graft to the RPDA in 2021.  At that time, his LIMA to the LAD was patent as was prior  LAD stent, with an occlusion of the vein graft to the diagonal, though this was filled via left to left collaterals.  He does not typically experience chest discomfort but did have chest pressure on the evening of admission in the setting of wide-complex tachycardia/rapid A. fib.  Troponin is mildly elevated at 69  82  182.  He is currently chest pain-free and orthopnea has improved following dialysis overnight.  Suspect demand ischemia as opposed to ACS.  Prior echo showed an EF of 45 to 50% in 2021.  Repeat echo pending.  If EF depressed, will need to consider relook catheterization.  Continue Plavix, statin, Zetia, and beta-blocker.  No aspirin in the setting of warfarin.  3.  Permanent atrial fibrillation/paroxysmal atrial flutter: This is poorly rate controlled.  Admission EKG appears to be atrial fibrillation with a wide-complex in the setting of mild hyperkalemia.  Notes indicate he was hemodynamically stable during significant tachycardia.  Since admission, he has been in atrial flutter, more or less dialed in and 114 bpm.  He is already on max dose metoprolol 100 mg twice daily and is not a candidate for calcium channel blocker therapy secondary to history of LV dysfunction.  He is a poor candidate for digoxin in the setting of renal failure.  He had been on amiodarone previously but this appears of been discontinued when he was no longer maintaining sinus rhythm.  Patient reports a 1 month history of worsening fatigue and I am concerned that persistent tachycardia is likely contributing to this.  He will benefit from outpatient electrophysiology evaluation.  4.  End-stage renal disease: Hemodialysis per nephrology.  5.  Ischemic cardiomyopathy/heart failure with midrange ejection fraction: Most recent echo in 2021 showed an EF of 45 to 50%.  Patient presented with tachycardia and volume overload and underwent dialysis.  Guideline directed medical therapy historically limited by relative hypotension  requiring midodrine.  He is on beta-blocker therapy but would not likely tolerate ACE inhibitor/ARB/Entresto/MRA.  Follow-up echo pending.  Consider further ischemic evaluation as outlined above.  6.  Essential hypertension: Stable and trending towards hypotension, which is chronic.  7.  Hyperlipidemia: He remains on statin and Zetia therapy with an LDL of 26.  8.  Peripheral arterial disease: Status post right BKA earlier this year.  He receives physical therapy at home.  9.  History of aortic stenosis status post mechanical AVR: Normal functioning valve by echo last year.  Repeat pending.  10.  Hyperkalemia: In the setting of end-stage renal disease.  Treated with urgent dialysis yesterday.  Potassium 4.5 this morning.  Signed, Murray Hodgkins, NP 08/13/2020, 2:10 PM  For questions or updates, please contact   Please consult [email or web address] for contact info under Cardiology/STEMI.

## 2020-08-13 NOTE — Progress Notes (Signed)
OT Cancellation Note  Patient Details Name: REGIE BUNNER MRN: [id number] DOB: 02-Oct-1955   Cancelled Treatment:    Reason Eval/Treat Not Completed: Medical issues which prohibited therapy  OT consult received, chart reviewed.  Pt currently with critically high INR (4.5) and is awaiting cardiology consult.  Will continue to follow up after cardiology consult and INR is in range for therapeutic activity per APTA guidelines.  Myrtie Hawk Kyel Purk, OTR/L 08/13/20, 8:11 AM

## 2020-08-14 ENCOUNTER — Inpatient Hospital Stay (HOSPITAL_COMMUNITY)
Admit: 2020-08-14 | Discharge: 2020-08-14 | Disposition: A | Discharge status: Home / Self Care | Payer: Medicare Other | Attending: Cardiovascular Disease | Admitting: Cardiovascular Disease

## 2020-08-14 DIAGNOSIS — I472 Ventricular tachycardia: Secondary | ICD-10-CM | POA: Diagnosis not present

## 2020-08-14 DIAGNOSIS — N186 End stage renal disease: Secondary | ICD-10-CM | POA: Diagnosis not present

## 2020-08-14 DIAGNOSIS — R000 Tachycardia, unspecified: Secondary | ICD-10-CM

## 2020-08-14 DIAGNOSIS — T8610 Unspecified complication of kidney transplant: Secondary | ICD-10-CM | POA: Diagnosis not present

## 2020-08-14 DIAGNOSIS — Z992 Dependence on renal dialysis: Secondary | ICD-10-CM | POA: Diagnosis not present

## 2020-08-14 DIAGNOSIS — I4819 Other persistent atrial fibrillation: Secondary | ICD-10-CM

## 2020-08-14 DIAGNOSIS — E8779 Other fluid overload: Secondary | ICD-10-CM

## 2020-08-14 LAB — BASIC METABOLIC PANEL
Anion gap: 15 (ref 5–15)
BUN: 31 mg/dL — ABNORMAL HIGH (ref 8–23)
CO2: 28 mmol/L (ref 22–32)
Calcium: 8.8 mg/dL — ABNORMAL LOW (ref 8.9–10.3)
Chloride: 91 mmol/L — ABNORMAL LOW (ref 98–111)
Creatinine, Ser: 4.87 mg/dL — ABNORMAL HIGH (ref 0.61–1.24)
GFR, Estimated: 13 mL/min — ABNORMAL LOW (ref >60)
Glucose, Bld: 114 mg/dL — ABNORMAL HIGH (ref 70–99)
Potassium: 4.1 mmol/L (ref 3.5–5.1)
Sodium: 134 mmol/L — ABNORMAL LOW (ref 135–145)

## 2020-08-14 LAB — ECHOCARDIOGRAM COMPLETE
AR max vel: 1.47 cm2
AV Area VTI: 1.97 cm2
AV Area mean vel: 1.48 cm2
AV Mean grad: 5 mmHg
AV Peak grad: 8.4 mmHg
Ao pk vel: 1.45 m/s
Area-P 1/2: 3.87 cm2
Height: 70 in
S' Lateral: 4.52 cm
Weight: 2507.95 oz

## 2020-08-14 LAB — PROTIME-INR
INR: 3.3 — ABNORMAL HIGH (ref 0.8–1.2)
Prothrombin Time: 33.4 seconds — ABNORMAL HIGH (ref 11.4–15.2)

## 2020-08-14 MED ORDER — METOPROLOL TARTRATE 50 MG PO TABS
50.0000 mg | ORAL_TABLET | Freq: Two times a day (BID) | ORAL | Status: DC
  Administered 2020-08-14: 50 mg via ORAL
  Filled 2020-08-14: qty 1

## 2020-08-14 MED ORDER — AMIODARONE HCL 200 MG PO TABS: 400.0000 mg | ORAL_TABLET | Freq: Two times a day (BID) | ORAL | Status: DC

## 2020-08-14 MED ORDER — WARFARIN SODIUM 1 MG PO TABS: 1.0000 mg | ORAL_TABLET | Freq: Once | ORAL | Status: DC

## 2020-08-14 MED ORDER — MIDODRINE HCL 5 MG PO TABS
10.0000 mg | ORAL_TABLET | Freq: Three times a day (TID) | ORAL | Status: DC
  Administered 2020-08-14 – 2020-08-16 (×7): 10 mg via ORAL
  Filled 2020-08-14 (×7): qty 2

## 2020-08-14 MED ORDER — AMIODARONE LOAD VIA INFUSION
150.0000 mg | Freq: Once | INTRAVENOUS | Status: AC
  Administered 2020-08-14: 150 mg via INTRAVENOUS
  Filled 2020-08-14: qty 83.34

## 2020-08-14 MED ORDER — AMIODARONE HCL IN DEXTROSE 360-4.14 MG/200ML-% IV SOLN
30.0000 mg/h | INTRAVENOUS | Status: DC
  Administered 2020-08-14: 30 mg/h via INTRAVENOUS
  Filled 2020-08-14 (×2): qty 200

## 2020-08-14 MED ORDER — WARFARIN 0.5 MG HALF TABLET
0.5000 mg | ORAL_TABLET | Freq: Once | ORAL | Status: AC
  Administered 2020-08-14: 0.5 mg via ORAL
  Filled 2020-08-14: qty 1

## 2020-08-14 MED ORDER — AMIODARONE HCL IN DEXTROSE 360-4.14 MG/200ML-% IV SOLN
60.0000 mg/h | INTRAVENOUS | Status: DC
  Administered 2020-08-14 (×2): 60 mg/h via INTRAVENOUS
  Filled 2020-08-14 (×2): qty 200

## 2020-08-14 NOTE — Progress Notes (Signed)
[facility]  ROUNDING NOTE   Subjective:   [name] was admitted to [facility] on 08/12/2020 for Hyperkalemia [E87.5] Wide-complex tachycardia ([place]) [I47.2] Fluid overload [E87.70]  Last hemodialysis treatment was 5/6. Patient completed his dialysis treatment. Patient was at his estimated dry weight at the end of treatment, 70.5 Kg  Patient was getting more short of breath since yesterday. He has been more weak. He denies any changes to his diet but states he is constipated.   Patient with V tach on the field. Was given IV amiodarone and 1 g of calcium. He was then given 2 g of calcium, insulin and dextrose when brought to the ED. Follow up K of 5.6.   Patient seen resting in bed Alert and oriented, complaints of intermittent anxiety Tolerating meals Says his treatment yesterday went well Says his BP is usually low   Objective:  Vital signs in last 24 hours:  Temp:  [97.9 F (36.6 C)-98.8 F (37.1 C)] 97.9 F (36.6 C) (05/10 0723) Pulse Rate:  [112-117] 115 (05/10 0723) Resp:  [10-24] 16 (05/10 0723) BP: (89-126)/(66-80) 126/77 (05/10 0723) SpO2:  [97 %-100 %] 97 % (05/10 0723) Weight:  [71.1 kg] 71.1 kg (05/10 0529)  Weight change: -4.5 kg Filed Weights   08/12/20 2137 08/13/20 0406 08/14/20 0529  Weight: 70.9 kg 70.9 kg 71.1 kg    Intake/Output: I/O last 3 completed shifts: In: 600 [P.O.:600] Out: -499 [Stool:1]   Intake/Output this shift:  Total I/O In: 240 [P.O.:240] Out: -   Physical Exam: General: NAD, resting in bed  Head: Normocephalic, atraumatic. Moist oral mucosal membranes  Eyes: Anicteric  Lungs:  Diminished bilaterally  Heart: Irregular, +murmur  Abdomen:  Soft, nontender,   Extremities:  no edema, Right BKA with clean dressings.   Neurologic: Nonfocal, moving all four extremities  Skin: No lesions  Access: Left AVF    Basic Metabolic Panel: Recent Labs  Lab 08/12/20 0946 08/13/20 0510 08/14/20 0540  NA 136 137 134*  K  5.6* 4.5 4.1  CL 92* 94* 91*  CO2 19* 29 28  GLUCOSE 55* 72 114*  BUN 45* 38* 31*  CREATININE 7.47* 5.76* 4.87*  CALCIUM 10.5* 9.3 8.8*    Liver Function Tests: Recent Labs  Lab 08/12/20 0946  AST 105*  ALT 96*  ALKPHOS 179*  BILITOT 2.0*  PROT 7.5  ALBUMIN 3.1*   No results for input(s): LIPASE, AMYLASE in the last 168 hours. No results for input(s): AMMONIA in the last 168 hours.  CBC: Recent Labs  Lab 08/12/20 0946 08/13/20 0510  WBC 13.4* 6.7  NEUTROABS 5.9  --   HGB 12.3* 11.8*  HCT 42.2 38.9*  MCV 108.2* 102.4*  PLT 106* 91*    Cardiac Enzymes: No results for input(s): CKTOTAL, CKMB, CKMBINDEX, TROPONINI in the last 168 hours.  BNP: Invalid input(s): POCBNP  CBG: Recent Labs  Lab 08/12/20 1244 08/12/20 1300 08/12/20 1627 08/12/20 1915  GLUCAP 23* 143* 109* 102*    Microbiology: Results for orders placed or performed during the hospital encounter of 08/12/20  Resp Panel by RT-PCR (Flu A&B, Covid) Nasopharyngeal Swab     Status: None   Collection Time: 08/12/20  9:46 AM   Specimen: Nasopharyngeal Swab; Nasopharyngeal(NP) swabs in vial transport medium  Result Value Ref Range Status   SARS Coronavirus 2 by RT PCR NEGATIVE NEGATIVE Final    Comment: (NOTE) SARS-CoV-2 target nucleic acids are NOT DETECTED.  The SARS-CoV-2 RNA is generally detectable in  upper respiratory specimens during the acute phase of infection. The lowest concentration of SARS-CoV-2 viral copies this assay can detect is 138 copies/mL. A negative result does not preclude SARS-Cov-2 infection and should not be used as the sole basis for treatment or other patient management decisions. A negative result may occur with  improper specimen collection/handling, submission of specimen other than nasopharyngeal swab, presence of viral mutation(s) within the areas targeted by this assay, and inadequate number of viral copies(<138 copies/mL). A negative result must be combined  with clinical observations, patient history, and epidemiological information. The expected result is Negative.  Fact Sheet for Patients:  EntrepreneurPulse.com.au  Fact Sheet for Healthcare Providers:  IncredibleEmployment.be  This test is no t yet approved or cleared by the [place] FDA and  has been authorized for detection and/or diagnosis of SARS-CoV-2 by FDA under an Emergency Use Authorization (EUA). This EUA will remain  in effect (meaning this test can be used) for the duration of the COVID-19 declaration under Section 564(b)(1) of the Act, 21 U.S.C.section 360bbb-3(b)(1), unless the authorization is terminated  or revoked sooner.       Influenza A by PCR NEGATIVE NEGATIVE Final   Influenza B by PCR NEGATIVE NEGATIVE Final    Comment: (NOTE) The Xpert Xpress SARS-CoV-2/FLU/RSV plus assay is intended as an aid in the diagnosis of influenza from Nasopharyngeal swab specimens and should not be used as a sole basis for treatment. Nasal washings and aspirates are unacceptable for Xpert Xpress SARS-CoV-2/FLU/RSV testing.  Fact Sheet for Patients: EntrepreneurPulse.com.au  Fact Sheet for Healthcare Providers: IncredibleEmployment.be  This test is not yet approved or cleared by the [place] FDA and has been authorized for detection and/or diagnosis of SARS-CoV-2 by FDA under an Emergency Use Authorization (EUA). This EUA will remain in effect (meaning this test can be used) for the duration of the COVID-19 declaration under Section 564(b)(1) of the Act, 21 U.S.C. section 360bbb-3(b)(1), unless the authorization is terminated or revoked.  Performed at [facility], [address] [place], [place] [address]   Culture, blood (Routine X 2) w Reflex to ID Panel     Status: None (Preliminary result)   Collection Time: 08/12/20  9:49 PM   Specimen: BLOOD  Result Value Ref Range Status    Specimen Description BLOOD BLOOD RIGHT FOREARM  Final   Special Requests   Final    BOTTLES DRAWN AEROBIC AND ANAEROBIC Blood Culture adequate volume   Culture   Final    NO GROWTH < 12 HOURS Performed at [facility], [address], [place], [place] [address]    Report Status PENDING  Incomplete  Culture, blood (Routine X 2) w Reflex to ID Panel     Status: None (Preliminary result)   Collection Time: 08/12/20  9:50 PM   Specimen: BLOOD  Result Value Ref Range Status   Specimen Description BLOOD  RIGHT HAND  Final   Special Requests   Final    BOTTLES DRAWN AEROBIC AND ANAEROBIC Blood Culture adequate volume   Culture   Final    NO GROWTH < 12 HOURS Performed at [facility], [address] [place], [place] [address]    Report Status PENDING  Incomplete   *Note: Due to a large number of results and/or encounters for the requested time period, some results have not been displayed. A complete set of results can be found in Results Review.    Coagulation Studies: Recent Labs    08/12/20 0946  08/13/20 0510 08/14/20 0540  LABPROT 40.7* 42.5* 33.4*  INR 4.2* 4.5* 3.3*    Urinalysis: No results for input(s): COLORURINE, LABSPEC, PHURINE, GLUCOSEU, HGBUR, BILIRUBINUR, KETONESUR, PROTEINUR, UROBILINOGEN, NITRITE, LEUKOCYTESUR in the last 72 hours.  Invalid input(s): APPERANCEUR    Imaging: DG Chest Portable 1 View  Result Date: 08/12/2020 CLINICAL DATA:  65 year old male with shortness of breath EXAM: PORTABLE CHEST 1 VIEW COMPARISON:  Prior chest x-ray 07/18/2020 FINDINGS: Stable cardiomegaly. Increased vascular congestion with mild interstitial edema. Patient is status post median sternotomy with evidence of prior multivessel CABG and aortic valve replacement. Atherosclerotic calcifications present in the transverse aorta. No large effusion or pneumothorax. External defibrillator pad projects over the central chest. No acute osseous abnormality.  IMPRESSION: 1. Mild CHF versus volume overload. 2. Cardiomegaly. 3. Aortic atherosclerotic calcifications. Electronically Signed   By: Jacqulynn Cadet M.D.   On: 08/12/2020 10:07     Medications:   . sodium chloride    . amiodarone     Followed by  . amiodarone     . allopurinol  100 mg Oral Daily  . amiodarone  150 mg Intravenous Once  . cinacalcet  30 mg Oral Q supper  . clopidogrel  75 mg Oral Q breakfast  . ezetimibe  10 mg Oral Daily  . hydroxychloroquine  400 mg Oral QHS  . loratadine  10 mg Oral Daily  . metoprolol tartrate  100 mg Oral BID  . midodrine  10 mg Oral Q M,W,F-HD  . multivitamin  1 tablet Oral Daily  . pantoprazole  40 mg Oral Daily  . rosuvastatin  40 mg Oral q1800  . sevelamer carbonate  3,200 mg Oral TID WC  . sodium chloride flush  3 mL Intravenous Q12H  . warfarin  0.5 mg Oral ONCE-1600  . Warfarin - Pharmacist Dosing Inpatient   Does not apply q1600   sodium chloride, albuterol, dextromethorphan-guaiFENesin, dextrose, hydrALAZINE, hydrOXYzine, lidocaine-prilocaine, loperamide, nitroGLYCERIN, oxyCODONE-acetaminophen, phenylephrine, polyethylene glycol, simethicone, sodium chloride flush, traZODone  Assessment/ Plan:  [name] is a 65 y.o. 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 End Stage Renal Disease with fluid overload and hyperkalemia - Received dialysis yesterday - UF goal 584ml adjusted based on low BP acheived - Next treatment  scheduled for Wednesday   2. Hypotension:  - Will start Midodrine TID  - Amiodarone drip started  3. Anemia of chronic kidney disease: hemoglobin 11.8, Macrocytic. Retacrit as outpatient.  Remains at an acceptable range  4. Secondary Hyperparathyroidism:  - sevelamer with meals - cinacalcet.    LOS: 2 Shamera Yarberry 5/10/20229:48 AM

## 2020-08-14 NOTE — Progress Notes (Addendum)
Progress Note  Patient Name: Jeremy [name] Date of Encounter: 08/14/2020  [facility] Cardiologist: Kathlyn Sacramento, MD   Subjective   Denies chest pain or shortness of breath.  Has occasional palpitations/fast heart rates.  Denies dizziness.  Gets out of breath when he tries to lay down on his back to go to sleep.  Family bringing in his CPAP mask today.  Inpatient Medications    Scheduled Meds: . allopurinol  100 mg Oral Daily  . cinacalcet  30 mg Oral Q supper  . clopidogrel  75 mg Oral Q breakfast  . ezetimibe  10 mg Oral Daily  . hydroxychloroquine  400 mg Oral QHS  . loratadine  10 mg Oral Daily  . metoprolol tartrate  50 mg Oral BID  . midodrine  10 mg Oral TID WC  . multivitamin  1 tablet Oral Daily  . pantoprazole  40 mg Oral Daily  . rosuvastatin  40 mg Oral q1800  . sevelamer carbonate  3,200 mg Oral TID WC  . sodium chloride flush  3 mL Intravenous Q12H  . warfarin  0.5 mg Oral ONCE-1600  . Warfarin - Pharmacist Dosing Inpatient   Does not apply q1600   Continuous Infusions: . sodium chloride    . amiodarone 60 mg/hr (08/14/20 1008)   Followed by  . amiodarone     PRN Meds: sodium chloride, albuterol, dextromethorphan-guaiFENesin, dextrose, hydrALAZINE, hydrOXYzine, lidocaine-prilocaine, loperamide, nitroGLYCERIN, oxyCODONE-acetaminophen, phenylephrine, polyethylene glycol, simethicone, sodium chloride flush, traZODone   Vital Signs    Vitals:   08/14/20 1008 08/14/20 1019 08/14/20 1030 08/14/20 1106  BP: 98/76 (!) 87/69 95/60 93/70   Pulse: (!) 114 (!) 112 (!) 111 (!) 112  Resp:    16  Temp:    97.7 F (36.5 C)  TempSrc:    Oral  SpO2:    100%  Weight:      Height:        Intake/Output Summary (Last 24 hours) at 08/14/2020 1200 Last data filed at 08/14/2020 0940 Gross per 24 hour  Intake 720 ml  Output -499 ml  Net 1219 ml   Last 3 Weights 08/14/2020 08/13/2020 08/12/2020  Weight (lbs) 156 lb 12 oz 156 lb 4.9 oz 156 lb 4.9 oz  Weight (kg) 71.1 kg  70.9 kg 70.9 kg      Telemetry    SVT/a flutter heart rate 108- Personally Reviewed  ECG    No new tracing reviewed- Personally Reviewed  Physical Exam   GEN: No acute distress.   Neck: No JVD Cardiac:  Irregular, tachycardic, mechanical click.  Respiratory: Clear anteriorly GI: Soft, nontender, non-distended  MS: No edema; right BKA noted Neuro:  Nonfocal  Psych: Normal affect   Labs    High Sensitivity Troponin:   Recent Labs  Lab 08/12/20 0946 08/12/20 1145 08/12/20 2149  TROPONINIHS 69* 82* 182*      Chemistry Recent Labs  Lab 08/12/20 0946 08/13/20 0510 08/14/20 0540  NA 136 137 134*  K 5.6* 4.5 4.1  CL 92* 94* 91*  CO2 19* 29 28  GLUCOSE 55* 72 114*  BUN 45* 38* 31*  CREATININE 7.47* 5.76* 4.87*  CALCIUM 10.5* 9.3 8.8*  PROT 7.5  --   --   ALBUMIN 3.1*  --   --   AST 105*  --   --   ALT 96*  --   --   ALKPHOS 179*  --   --   BILITOT 2.0*  --   --  GFRNONAA 8* 10* 13*  ANIONGAP 25* 14 15     Hematology Recent Labs  Lab 08/12/20 0946 08/13/20 0510  WBC 13.4* 6.7  RBC 3.90* 3.80*  HGB 12.3* 11.8*  HCT 42.2 38.9*  MCV 108.2* 102.4*  MCH 31.5 31.1  MCHC 29.1* 30.3  RDW 18.0* 17.6*  PLT 106* 91*    BNPNo results for input(s): BNP, PROBNP in the last 168 hours.   DDimer No results for input(s): DDIMER in the last 168 hours.   Radiology    No results found.  Cardiac Studies   Echo 08/14/2020 1. Left ventricular ejection fraction, by estimation, is 25 to 30%. The  left ventricle has severely decreased function. The left ventricle  demonstrates global hypokinesis. There is mild left ventricular  hypertrophy. Left ventricular diastolic parameters  are indeterminate.  2. Right ventricular systolic function is severely reduced. The right  ventricular size is moderately enlarged.  3. Left atrial size was severely dilated.  4. The mitral valve is degenerative. Mild mitral valve regurgitation.  Moderate mitral annular  calcification.  5. The aortic valve has been repaired/replaced. Aortic valve  regurgitation is not visualized.  6. The inferior vena cava is normal in size with <50% respiratory  variability, suggesting right atrial pressure of 8 mmHg.   Patient Profile     65 y.o. male with history of CAD/CABG, PCI to RPDA, persistent A. fib/flutter, severe AS s/p SAVR mechanical aortic valve, presenting with chest pain, orthopnea, wide-complex tachycardia.  Echocardiogram today showing EF severely reduced.  Assessment & Plan    1.  Atrial flutter, SVT, WCT -Start IV amiodarone for rate control -BP is low, reduce Lopressor to 50 mg twice daily -Continue warfarin.  INR therapeutic -Consider DC cardioversion tomorrow if patient is still tachycardic  2. Ischemic cardiomyopathy, newly severely reduced EF -Low blood pressures preventing additional CHF meds at this time -Plan to transition Lopressor to Toprol-XL prior to discharge -Discussed with IC regarding LHC versus Myoview  3.  CAD/CABG, PCI to RPDA -Plavix, Crestor, warfarin  4.  AS s/p SAVR -Warfarin  5.  End-stage renal disease -HD as per nephrology  Total encounter time 35 minutes  Greater than 50% was spent in counseling and coordination of care with the patient       Signed, Kate Sable, MD  08/14/2020, 12:00 PM

## 2020-08-14 NOTE — Progress Notes (Signed)
OT Cancellation Note  Patient Details Name: Jeremy [name] MRN: [id number] DOB: [birth year]   Cancelled Treatment:    Reason Eval/Treat Not Completed: Medical issues which prohibited therapy. Pt continues to present with tachycardia this AM (resting HR 110s). Per chart review, pt scheduled for cardioversion tomorrow. Plan to evaluate and initiate OT services when pt is medically appropriate.   Fredirick Maudlin, OTR/L [place]

## 2020-08-14 NOTE — Progress Notes (Signed)
[facility]                                                                              Patient Demographics  Jeremy [name], is a 65 y.o. male, DOB - Apr 23, 1955, XHB:[id number]  Admit date - 08/12/2020   Admitting Physician Ivor Costa, MD  Outpatient Primary MD for the patient is Caryl Bis Angela Adam, MD  Outpatient specialists:   LOS - 2  days   Medical records reviewed and are as summarized below:    Chief Complaint  Patient presents with  . Chest Pain  . Shortness of Breath       Brief summary   Patient is a 64 year old male with ESRD-HD, MWF status post mechanical AVR on Coumadin, atrial fibrillation, hypertension, hyperlipidemia, gout, RA, thrombocytopenia, OSA on CPAP, CAD, CABG, systolic CHF , PVD, anemia, renal cell CA, failed kidney transplantation presented with shortness of breath.  Patient reported that his shortness of breath progressively worsened the day before the admission, had dry cough but no fevers or chills.  Also reported central chest pain which resolved in ED. EMS also noted EKG concerning for V. tach and patient was given 1 g calcium and 150 mg bolus of amiodarone.  Of note, patient had right BKA recently and his wound is healing  In ED, O2 sats 92% on room air 100% on 2 L, chest x-ray showed cardiomegaly, volume overload.  Patient was admitted for further work-up.  Nephrology was consulted for urgent hemodialysis.   08/12/2020 - 08/14/20 -Patient underwent back-to-back hemodialysis, ultimately on admission on 5/8 and then resumed his schedule, second HD on 5/9 -Cardiology was consulted due to atrial flutter, ?Vtach per EMS.  -HR still uncontrolled, in atrial flutter, started amiodarone IV for rate control, possible cardioversion on 5/11 if patient continues to be tachycardiac   Assessment & Plan    Principal Problem:   Fluid overload, acute on chronic systolic CHF with underlying history of ESRD on hemodialysis, Monday Wednesday  Friday -Patient presented with shortness of breath, positive JVD, pulmonary edema on chest x-ray clinically consistent with fluid overload and CHF exacerbation. -2D echo 09/2019 had shown EF of 45 to 50%. -Nephrology consulted and patient underwent emergent hemodialysis on 5/8, then resumed schedule, MWF with HD on 5/9  Active Problems: Atrial flutter with RVR versus V. Tach -Unclear as the trigger for fluid overload due to heart arrhythmia -Patient received 1 g calcium and amiodarone 150 mg was by EMS -Heart rate still uncontrolled, cardiology following, started on IV amiodarone today, may need DCCV on 5/11 if persistent tachycardia   Demand ischemia with elevated troponin likely due to fluid overload, acute on chronic systolic CHF and arrhythmia -Continue Plavix, Crestor, Zetia, metoprolol -Lipid panel showed LDL 26 -Cardiology following     S/P mechanical AVR (aortic valve replacement) -  INR supratherapeutic on admission -Currently improving, INR 3.3, Coumadin per pharmacy  Hyperkalemia -Potassium 5.6 on admission, was treated with D50, calcium gluconate, NovoLog 10 units, sodium bicarb. Underwent urgent hemodialysis -Now improved to 4.1  Anemia, normocytic likely due to anemia of chronic disease/ESRD -H&H currently at baseline -Baseline hemoglobin ~9  Gout -Continue  allopurinol  PAD -Continue Plavix  Hypoglycemia on admission -Received to D50, continue as needed  Leukocytosis -On admission 13.4, improved to 6.7, no fevers -Likely reactive.   Thrombocytopenia -Monitor counts closely, platelets 91K  Elevated LFTs -Follow closely, hepatitis panel negative -Avoid Tylenol.  If continues to worsen, will hold statin  Code Status: Full code DVT Prophylaxis:  Coumadin warfarin (COUMADIN) tablet 0.5 mg   Level of Care: Level of care: Progressive Cardiac Family Communication: Discussed all imaging results, lab results, explained to the patient   Disposition Plan:      Status is: Inpatient  Remains inpatient appropriate because:Inpatient level of care appropriate due to severity of illness   Dispo: The patient is from: Home              Anticipated d/c is to: Home              Patient currently is not medically stable to d/c.  Started on IV amiodarone drip for atrial flutter with RVR   Difficult to place patient No      Time Spent in minutes 25 minutes  Procedures:  Hemodialysis  Consultants:   Cardiology Nephrology  Antimicrobials:   Anti-infectives (From admission, onward)   Start     Dose/Rate Route Frequency Ordered Stop   08/12/20 2200  hydroxychloroquine (PLAQUENIL) tablet 400 mg        400 mg Oral Daily at bedtime 08/12/20 1917           Medications  Scheduled Meds: . allopurinol  100 mg Oral Daily  . cinacalcet  30 mg Oral Q supper  . clopidogrel  75 mg Oral Q breakfast  . ezetimibe  10 mg Oral Daily  . hydroxychloroquine  400 mg Oral QHS  . loratadine  10 mg Oral Daily  . metoprolol tartrate  50 mg Oral BID  . midodrine  10 mg Oral TID WC  . multivitamin  1 tablet Oral Daily  . pantoprazole  40 mg Oral Daily  . rosuvastatin  40 mg Oral q1800  . sevelamer carbonate  3,200 mg Oral TID WC  . sodium chloride flush  3 mL Intravenous Q12H  . warfarin  0.5 mg Oral ONCE-1600  . Warfarin - Pharmacist Dosing Inpatient   Does not apply q1600   Continuous Infusions: . sodium chloride    . amiodarone 60 mg/hr (08/14/20 1008)   Followed by  . amiodarone     PRN Meds:.sodium chloride, albuterol, dextromethorphan-guaiFENesin, dextrose, hydrALAZINE, hydrOXYzine, lidocaine-prilocaine, loperamide, nitroGLYCERIN, oxyCODONE-acetaminophen, phenylephrine, polyethylene glycol, simethicone, sodium chloride flush, traZODone      Subjective:   Jeremy [name] was seen and examined today.  Heart rate still uncontrolled, 110s to 117, atrial flutter.  No chest pain.  Patientdenies dizziness,  abdominal pain, N/V/D/C, new weakness, numbess,  tingling.   Objective:   Vitals:   08/14/20 1100 08/14/20 1106 08/14/20 1130 08/14/20 1200  BP: 95/71 93/70 99/72  103/78  Pulse: (!) 111 (!) 112 (!) 110 (!) 109  Resp:  16    Temp:  97.7 F (36.5 C)    TempSrc:  Oral    SpO2:  100%    Weight:      Height:        Intake/Output Summary (Last 24 hours) at 08/14/2020 1256 Last data filed at 08/14/2020 0940 Gross per 24 hour  Intake 720 ml  Output -499 ml  Net 1219 ml     Wt Readings from Last 3 Encounters:  08/14/20 71.1 kg  08/07/20  70.3 kg  07/31/20 71.5 kg   Physical Exam  General: Alert and oriented x 3, NAD  Cardiovascular: Irregularly irregular, tachycardia, mechanical aortic valve click  Respiratory: Diminished breath sound at the bases  Gastrointestinal: Soft, nontender, nondistended, NBS  Ext: status post right BKA  Neuro: no new deficits  Musculoskeletal: No cyanosis, clubbing  Skin: No rashes  Psych: Normal affect and demeanor, alert and oriented x3    Data Reviewed:  I have personally reviewed following labs and imaging studies  Micro Results Recent Results (from the past 240 hour(s))  Resp Panel by RT-PCR (Flu A&B, Covid) Nasopharyngeal Swab     Status: None   Collection Time: 08/12/20  9:46 AM   Specimen: Nasopharyngeal Swab; Nasopharyngeal(NP) swabs in vial transport medium  Result Value Ref Range Status   SARS Coronavirus 2 by RT PCR NEGATIVE NEGATIVE Final    Comment: (NOTE) SARS-CoV-2 target nucleic acids are NOT DETECTED.  The SARS-CoV-2 RNA is generally detectable in upper respiratory specimens during the acute phase of infection. The lowest concentration of SARS-CoV-2 viral copies this assay can detect is 138 copies/mL. A negative result does not preclude SARS-Cov-2 infection and should not be used as the sole basis for treatment or other patient management decisions. A negative result may occur with  improper specimen collection/handling, submission of specimen other than  nasopharyngeal swab, presence of viral mutation(s) within the areas targeted by this assay, and inadequate number of viral copies(<138 copies/mL). A negative result must be combined with clinical observations, patient history, and epidemiological information. The expected result is Negative.  Fact Sheet for Patients:  EntrepreneurPulse.com.au  Fact Sheet for Healthcare Providers:  IncredibleEmployment.be  This test is no t yet approved or cleared by the [place] FDA and  has been authorized for detection and/or diagnosis of SARS-CoV-2 by FDA under an Emergency Use Authorization (EUA). This EUA will remain  in effect (meaning this test can be used) for the duration of the COVID-19 declaration under Section 564(b)(1) of the Act, 21 U.S.C.section 360bbb-3(b)(1), unless the authorization is terminated  or revoked sooner.       Influenza A by PCR NEGATIVE NEGATIVE Final   Influenza B by PCR NEGATIVE NEGATIVE Final    Comment: (NOTE) The Xpert Xpress SARS-CoV-2/FLU/RSV plus assay is intended as an aid in the diagnosis of influenza from Nasopharyngeal swab specimens and should not be used as a sole basis for treatment. Nasal washings and aspirates are unacceptable for Xpert Xpress SARS-CoV-2/FLU/RSV testing.  Fact Sheet for Patients: EntrepreneurPulse.com.au  Fact Sheet for Healthcare Providers: IncredibleEmployment.be  This test is not yet approved or cleared by the [place] FDA and has been authorized for detection and/or diagnosis of SARS-CoV-2 by FDA under an Emergency Use Authorization (EUA). This EUA will remain in effect (meaning this test can be used) for the duration of the COVID-19 declaration under Section 564(b)(1) of the Act, 21 U.S.C. section 360bbb-3(b)(1), unless the authorization is terminated or revoked.  Performed at [facility], [address] [place], [place]  [address]   Culture, blood (Routine X 2) w Reflex to ID Panel     Status: None (Preliminary result)   Collection Time: 08/12/20  9:49 PM   Specimen: BLOOD  Result Value Ref Range Status   Specimen Description BLOOD BLOOD RIGHT FOREARM  Final   Special Requests   Final    BOTTLES DRAWN AEROBIC AND ANAEROBIC Blood Culture adequate volume   Culture   Final    NO GROWTH <  12 HOURS Performed at [facility], [address] [place], [place] [address]    Report Status PENDING  Incomplete  Culture, blood (Routine X 2) w Reflex to ID Panel     Status: None (Preliminary result)   Collection Time: 08/12/20  9:50 PM   Specimen: BLOOD  Result Value Ref Range Status   Specimen Description BLOOD  RIGHT HAND  Final   Special Requests   Final    BOTTLES DRAWN AEROBIC AND ANAEROBIC Blood Culture adequate volume   Culture   Final    NO GROWTH < 12 HOURS Performed at [facility], [address] [place], [place] [address]    Report Status PENDING  Incomplete    Radiology Reports CT ABDOMEN PELVIS WO CONTRAST  Result Date: 07/18/2020 CLINICAL DATA:  65 year old with suprapubic pain. History of renal cell carcinoma. EXAM: CT ABDOMEN AND PELVIS WITHOUT CONTRAST TECHNIQUE: Multidetector CT imaging of the abdomen and pelvis was performed following the standard protocol without IV contrast. COMPARISON:  03/06/2020 and chest CT 04/19/2020 FINDINGS: Lower chest: Trace right pleural fluid. Heart is enlarged. Coronary artery calcifications. Hepatobiliary: High-density material layering in the gallbladder is similar to the previous examination and could represent small stones and/or sludge. Small amount of perihepatic ascites. Pancreas: No evidence for acute inflammation or significant duct dilatation. However, there is a new low-density structure along the medial aspect of the pancreatic head and uncinate process on sequence 3, image 38 that measures 1.4 cm. Hounsfield units of the structure is  1.4. This is suggestive for a small cystic structure which was not clearly present on the previous examination. Spleen: Normal in size without focal abnormality. Adrenals/Urinary Tract: No gross abnormality to the adrenal glands. Evidence for bilateral nephrectomies. Urinary bladder is decompressed. There is a transplant kidney in the right lower quadrant with extensive parenchymal calcifications and multiple endovascular coils near the hilum. Stomach/Bowel: Normal appendix. No evidence for bowel obstruction or acute bowel inflammation. Vascular/Lymphatic: Extensive atherosclerotic calcifications throughout the abdomen and pelvis. Negative for an abdominal aortic aneurysm. No significant lymph node enlargement in the abdomen or pelvis. Reproductive: Prostate is unremarkable. Other: Mild presacral edema which is new. Trace perihepatic ascites. Musculoskeletal: Diffuse sclerosis throughout the bones compatible with chronic kidney disease. Disc space narrowing at L5-S1. mild subcutaneous edema. IMPRESSION: 1. Trace right pleural fluid with trace perihepatic ascites. New presacral edema. Findings could be associated with mild fluid overload state. 2. New 1.4 cm low-density collection near the pancreatic head and uncinate process. This could represent a small cyst but incompletely characterized. Based on history renal cell carcinoma, consider 3 month follow-up MRI to further characterize this area. 3. Bilateral nephrectomies with a transplant kidney. 4. Extensive atherosclerotic disease. Aortic Atherosclerosis (ICD10-I70.0). 5. Cholelithiasis.  No significant gallbladder distension. Electronically Signed   By: Markus Daft M.D.   On: 07/18/2020 17:44   DG Chest 2 View  Result Date: 07/18/2020 CLINICAL DATA:  Left basilar crackles, afebrile, likely atelectasis. EXAM: CHEST - 2 VIEW COMPARISON:  Radiograph 07/02/2020. FINDINGS: Post median sternotomy and CABG. Chronic cardiomegaly unchanged. Peribronchial thickening.  Mild septal thickening at the lung bases, progression on the right. Trace fluid in the right minor fissure with small sub pulmonic effusions. No confluent consolidation. No pneumothorax. Remote right posterior sixth rib fracture. IMPRESSION: 1. Slight increase in septal thickening at the lung bases suspicious for pulmonary edema. Small pleural effusions as well as trace fluid in the right minor fissure. 2. Chronic cardiomegaly post CABG. Aortic Atherosclerosis (ICD10-I70.0).  Electronically Signed   By: Keith Rake M.D.   On: 07/18/2020 16:55   DG Chest Portable 1 View  Result Date: 08/12/2020 CLINICAL DATA:  65 year old male with shortness of breath EXAM: PORTABLE CHEST 1 VIEW COMPARISON:  Prior chest x-ray 07/18/2020 FINDINGS: Stable cardiomegaly. Increased vascular congestion with mild interstitial edema. Patient is status post median sternotomy with evidence of prior multivessel CABG and aortic valve replacement. Atherosclerotic calcifications present in the transverse aorta. No large effusion or pneumothorax. External defibrillator pad projects over the central chest. No acute osseous abnormality. IMPRESSION: 1. Mild CHF versus volume overload. 2. Cardiomegaly. 3. Aortic atherosclerotic calcifications. Electronically Signed   By: Jacqulynn Cadet M.D.   On: 08/12/2020 10:07   ECHOCARDIOGRAM COMPLETE  Result Date: 08/14/2020    ECHOCARDIOGRAM REPORT   Patient Name:   NIJEL FLINK Date of Exam: 08/14/2020 Medical Rec #:  [id number]   Height:       70.0 in Accession #:    [id number]  Weight:       156.7 lb Date of Birth:  03/02/1956   BSA:          1.882 m Patient Age:    9 years    BP:           89/67 mmHg Patient Gender: M           HR:           114 bpm. Exam Location:  [facility] Procedure: 2D Echo, Cardiac Doppler and Color Doppler Indications:     Ventriculat Tachycardia I47.2  History:         Patient has prior history of Echocardiogram examinations, most                  recent 10/01/2019. Previous  Myocardial Infarction. ESRD, PAF.  Sonographer:     Sherrie Sport RDCS (AE) Referring Phys:  [place] Phys: Kate Sable MD IMPRESSIONS  1. Left ventricular ejection fraction, by estimation, is 25 to 30%. The left ventricle has severely decreased function. The left ventricle demonstrates global hypokinesis. There is mild left ventricular hypertrophy. Left ventricular diastolic parameters  are indeterminate.  2. Right ventricular systolic function is severely reduced. The right ventricular size is moderately enlarged.  3. Left atrial size was severely dilated.  4. The mitral valve is degenerative. Mild mitral valve regurgitation. Moderate mitral annular calcification.  5. The aortic valve has been repaired/replaced. Aortic valve regurgitation is not visualized.  6. The inferior vena cava is normal in size with <50% respiratory variability, suggesting right atrial pressure of 8 mmHg. FINDINGS  Left Ventricle: Left ventricular ejection fraction, by estimation, is 25 to 30%. The left ventricle has severely decreased function. The left ventricle demonstrates global hypokinesis. The left ventricular internal cavity size was normal in size. There is mild left ventricular hypertrophy. Left ventricular diastolic parameters are indeterminate. Right Ventricle: The right ventricular size is moderately enlarged. No increase in right ventricular wall thickness. Right ventricular systolic function is severely reduced. Left Atrium: Left atrial size was severely dilated. Right Atrium: Right atrial size was normal in size. Pericardium: There is no evidence of pericardial effusion. Mitral Valve: The mitral valve is degenerative in appearance. Moderate mitral annular calcification. Mild mitral valve regurgitation. Tricuspid Valve: The tricuspid valve is normal in structure. Tricuspid valve regurgitation is mild. Aortic Valve: The aortic valve has been repaired/replaced. Aortic valve regurgitation is not  visualized. Aortic valve mean gradient measures 5.0 mmHg. Aortic valve peak gradient  measures 8.4 mmHg. Aortic valve area, by VTI measures 1.97 cm. There is a mechanical valve present in the aortic position. Pulmonic Valve: The pulmonic valve was grossly normal. Pulmonic valve regurgitation is not visualized. Aorta: The aortic root is normal in size and structure. Venous: The inferior vena cava is normal in size with less than 50% respiratory variability, suggesting right atrial pressure of 8 mmHg. IAS/Shunts: No atrial level shunt detected by color flow Doppler.  LEFT VENTRICLE PLAX 2D LVIDd:         5.57 cm LVIDs:         4.52 cm LV PW:         1.35 cm LV IVS:        1.01 cm LVOT diam:     2.00 cm LV SV:         36 LV SV Index:   19 LVOT Area:     3.14 cm  RIGHT VENTRICLE RV Basal diam:  6.33 cm RV S prime:     4.46 cm/s TAPSE (M-mode): 3.4 cm LEFT ATRIUM              Index       RIGHT ATRIUM           Index LA diam:        5.30 cm  2.82 cm/m  RA Area:     35.50 cm LA Vol (A2C):   178.0 ml 94.58 ml/m RA Volume:   153.00 ml 81.30 ml/m LA Vol (A4C):   163.0 ml 86.61 ml/m LA Biplane Vol: 171.0 ml 90.86 ml/m  AORTIC VALVE                    PULMONIC VALVE AV Area (Vmax):    1.47 cm     PV Vmax:        0.45 m/s AV Area (Vmean):   1.48 cm     PV Peak grad:   0.8 mmHg AV Area (VTI):     1.97 cm     RVOT Peak grad: 2 mmHg AV Vmax:           144.67 cm/s AV Vmean:          105.667 cm/s AV VTI:            0.185 m AV Peak Grad:      8.4 mmHg AV Mean Grad:      5.0 mmHg LVOT Vmax:         67.90 cm/s LVOT Vmean:        49.800 cm/s LVOT VTI:          0.116 m LVOT/AV VTI ratio: 0.63  AORTA Ao Root diam: 3.20 cm MITRAL VALVE                TRICUSPID VALVE MV Area (PHT): 3.87 cm     TR Peak grad:   31.8 mmHg MV Decel Time: 196 msec     TR Vmax:        282.00 cm/s MV E velocity: 129.00 cm/s                             SHUNTS                             Systemic VTI:  0.12 m  Systemic Diam: 2.00  cm Kate Sable MD Electronically signed by Kate Sable MD Signature Date/Time: 08/14/2020/12:10:33 PM    Final     Lab Data:  CBC: Recent Labs  Lab 08/12/20 0946 08/13/20 0510  WBC 13.4* 6.7  NEUTROABS 5.9  --   HGB 12.3* 11.8*  HCT 42.2 38.9*  MCV 108.2* 102.4*  PLT 106* 91*   Basic Metabolic Panel: Recent Labs  Lab 08/12/20 0946 08/13/20 0510 08/14/20 0540  NA 136 137 134*  K 5.6* 4.5 4.1  CL 92* 94* 91*  CO2 19* 29 28  GLUCOSE 55* 72 114*  BUN 45* 38* 31*  CREATININE 7.47* 5.76* 4.87*  CALCIUM 10.5* 9.3 8.8*   GFR: Estimated Creatinine Clearance: 15.6 mL/min (A) (by C-G formula based on SCr of 4.87 mg/dL (H)). Liver Function Tests: Recent Labs  Lab 08/12/20 0946  AST 105*  ALT 96*  ALKPHOS 179*  BILITOT 2.0*  PROT 7.5  ALBUMIN 3.1*   No results for input(s): LIPASE, AMYLASE in the last 168 hours. No results for input(s): AMMONIA in the last 168 hours. Coagulation Profile: Recent Labs  Lab 08/08/20 0000 08/12/20 0946 08/13/20 0510 08/14/20 0540  INR 4.2* 4.2* 4.5* 3.3*   Cardiac Enzymes: No results for input(s): CKTOTAL, CKMB, CKMBINDEX, TROPONINI in the last 168 hours. BNP (last 3 results) No results for input(s): PROBNP in the last 8760 hours. HbA1C: Recent Labs    08/13/20 0510  HGBA1C 5.0   CBG: Recent Labs  Lab 08/12/20 1244 08/12/20 1300 08/12/20 1627 08/12/20 1915  GLUCAP 23* 143* 109* 102*   Lipid Profile: Recent Labs    08/13/20 0510  CHOL 59  HDL 21*  LDLCALC 26  TRIG 62  CHOLHDL 2.8   Thyroid Function Tests: No results for input(s): TSH, T4TOTAL, FREET4, T3FREE, THYROIDAB in the last 72 hours. Anemia Panel: No results for input(s): VITAMINB12, FOLATE, FERRITIN, TIBC, IRON, RETICCTPCT in the last 72 hours. Urine analysis:    Component Value Date/Time   COLORURINE AMBER (A) 07/17/2016 1531   APPEARANCEUR CLOUDY (A) 07/17/2016 1531   LABSPEC 1.030 07/17/2016 1531   PHURINE 8.0 07/17/2016 1531    GLUCOSEU 50 (A) 07/17/2016 1531   HGBUR NEGATIVE 07/17/2016 1531   BILIRUBINUR NEGATIVE 07/17/2016 1531   KETONESUR NEGATIVE 07/17/2016 1531   PROTEINUR 100 (A) 07/17/2016 1531   NITRITE NEGATIVE 07/17/2016 1531   LEUKOCYTESUR NEGATIVE 07/17/2016 1531     Anayla Giannetti M.D. [facility] 08/14/2020, 12:56 PM  Available via Epic secure chat 7am-7pm After 7 pm, please refer to night coverage provider listed on amion.

## 2020-08-14 NOTE — Progress Notes (Signed)
*  PRELIMINARY RESULTS* Echocardiogram 2D Echocardiogram has been performed.  Jeremy [name] 08/14/2020, 10:45 AM

## 2020-08-14 NOTE — Consult Note (Addendum)
ANTICOAGULATION CONSULT NOTE - Initial Consult  Pharmacy Consult for warfarin Indication: s/p of AVR with mechanical valve  Allergies  Allergen Reactions  . Statins Other (See Comments)    Arthralgias (intolerance), Myalgias (intolerance)  Pt taking pravastatin   . Gabapentin     Drowsy   . Sertraline Rash    Patient Measurements: Height: 5\' 10"  (177.8 cm) Weight: 71.1 kg (156 lb 12 oz) IBW/kg (Calculated) : 73   Vital Signs: Temp: 98.1 F (36.7 C) (05/09 2311) Temp Source: Oral (05/09 2311) BP: 109/80 (05/09 2311) Pulse Rate: 112 (05/09 2311)  Labs: Recent Labs    08/12/20 0946 08/12/20 1145 08/12/20 2149 08/13/20 0510 08/14/20 0540  HGB 12.3*  --   --  11.8*  --   HCT 42.2  --   --  38.9*  --   PLT 106*  --   --  91*  --   LABPROT 40.7*  --   --  42.5* 33.4*  INR 4.2*  --   --  4.5* 3.3*  CREATININE 7.47*  --   --  5.76* 4.87*  TROPONINIHS 69* 82* 182*  --   --     Estimated Creatinine Clearance: 15.6 mL/min (A) (by C-G formula based on SCr of 4.87 mg/dL (H)).   Medical History: Past Medical History:  Diagnosis Date  . Abnormal stress test 03/19/2018  . 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 Anxiety    situation  . Aortic stenosis    a. 2014 s/p mech AVR ([facility])-->chronic coumadin; b. 08/2018 Echo: Mild AS/AI. Nl fxn/gradient; c. 09/2019 Echo: EF 45-50%, AoV mean grad 62mmHg.  . Arthritis   . CAD (coronary artery disease)    a. 2013 PCI: LAD 29m (2.75x28 Xience DES); b. 06/2018 Cath: LM min irregs, LAD 60p, 95/30/48m, LCX min irregs, RCA 40p, 41m, 90d, RPAV 90, RPDA 95ost; d. 09/2018 CABGx3 (LIMA->LAD, VG->D1, VG->PDA); e. 07/2019 PCI: LAD 90p, 37m, 35m ISR, 30m, D2 fills via L->L collats, LCX mild diff dzs, RCA 40p, 79m, 99d, RPAV 99, RPDA 95ost, VG->D2 100, LIMA->LAD ok, VG->RPDA 99 (4.5x15 Resolute Onyx DES).  . Chronic anticoagulation 06/2012   a. Coumadin in the setting of mech AVR and afib.  . Complication of anesthesia   . COVID-19 03/15/2019  .  CPAP (continuous positive airway pressure) dependence   . Dialysis patient [facility]) 04/07/2006   Patient started HD around 1996,  had 1st transplant LLQ around 1998 until 2007 when they found renal cell cancer in transplant and both native kidneys, all were removed > went back on HD until 2nd transplant RLQ in 2014 which lasted 3 yrs; it was embolized for suspected acute rejection. Is currently on HD MWF in [place] w/ [facility] nephrology.  . ESRD (end stage renal disease) ([place])    a. CKD 2/2 to IgA nephropathy (dx age 58); b. 1999 s/p Kidney transplant; c. 2014 Bilat nephrectomy (transplanted kidney resected) in the setting of renal cell carcinoma; d. PD until 11/2017-->HD.  Marland Kitchen Heart murmur   . HFrEF (heart failure with reduced ejection fraction) ([place])    a. 07/2016 Echo: EF 55-60%; b. 02/2018 MV: EF 30%; c. 04/2018 Echo: EF 35%; d. 08/2018 Echo: EF 30-35%. Glob HK w/o rwma; e. 09/2019 Echo: EF 45-50%, mod LVH, basal-mid inf/inflat HK, nl RV fxn, mod dil LA, mildly dil RA. Nl AoV prosthesis (mean grad 76mmHg).  . History of blood transfusion   . Hx of colonoscopy    2009 by Dr. Laural Golden. Normal  except for small submucosal lipoma. No evidence of polyps or colitis.  . Hyperlipidemia   . Hypertension   . Hypogonadism in male 07/25/2015  . Ischemic cardiomyopathy    a. 07/2016 Echo: EF 55-60%; b. 02/2018 MV: EF 30%; c. 04/2018 Echo: EF 35%; d. 08/2018 Echo: EF 30-35%; e.  09/2019 Echo: EF 45-50%, mod LVH, basal-mid inf/inflat HK.  . Myocardial infarction ([place])   . OSA (obstructive sleep apnea)    No CPAP  . Paroxysmal atrial flutter ([place])   . Peripheral vascular disease ([place])    a. 09/2019 s/p L SFA and L profunda stenting; b. 12/2019 s/p R PT and TP trunk PTA; c. 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  Marland Kitchen Permanent atrial fibrillation ([facility])    a. CHA2DS2VASc = 3-->Coumadin (also mech AVR); b. 06/2018 Amio started 2/2 elevated rates.  Marland Kitchen PONV (postoperative nausea and vomiting)   . Renal cell carcinoma ([place]) 08/04/2016     Medications:  PTA: Warfarin 1 mg daily, Friday dose may change depending on reading per chart review.   No DDI concerns at this time  Assessment: 65 yo M presenting with chest pain and shortness of breath. 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 Pharmacy has been consulted to dose and monitor warfarin.   Goal of Therapy:  INR 2.5-3.5  (per chart review) Monitor platelets by anticoagulation protocol: Yes   Date INR Dose 5/8 4.2  Hold 5/9 4.5 Hold 5/10 3.3   Plan:  INR therapeutic today (4.5>3.3) after 2 days of held doses. Will resume at reduced 0.5mg  dose x1 tonight given precipitous drop in INR, and start of new amiodarone infusion, but improving volume status with HD sessions.   Of note, patient received 1 time dose of amiodarone(unsure of strength) during transportation to hospital (seemingly large effect on INR level), and pt volume overloaded with likely hepatic congestion & slightly dec' albumin. No other notable DDI's. Recheck INR and CBC with AM labs  Lorna Dibble, PharmD  08/14/2020 7:18 AM

## 2020-08-14 NOTE — Progress Notes (Signed)
PT Cancellation Note  Patient Details Name: Jeremy [name] MRN: [id number] DOB: Oct 27, 1955   Cancelled Treatment:    Reason Eval/Treat Not Completed: Medical issues which prohibited therapy.  Per MD note this date: "HR still uncontrolled, in atrial flutter, started amiodarone IV for rate control, possible cardioversion on 5/11 if patient continues to be tachycardiac".  Will hold PT evaluation this date and attempt to see pt at a future date as medically appropriate.    Linus Salmons PT, DPT 08/14/20, 1:54 PM

## 2020-08-15 ENCOUNTER — Encounter: Payer: Self-pay | Admitting: Cardiology

## 2020-08-15 ENCOUNTER — Telehealth: Payer: Medicare Other

## 2020-08-15 ENCOUNTER — Telehealth: Payer: Self-pay | Admitting: *Deleted

## 2020-08-15 ENCOUNTER — Encounter
Admission: EM | Disposition: A | Discharge status: Home Health | Payer: Self-pay | Source: Home / Self Care | Attending: Internal Medicine

## 2020-08-15 ENCOUNTER — Inpatient Hospital Stay: Payer: Medicare Other | Admitting: Anesthesiology

## 2020-08-15 DIAGNOSIS — T8610 Unspecified complication of kidney transplant: Secondary | ICD-10-CM | POA: Diagnosis not present

## 2020-08-15 DIAGNOSIS — I2510 Atherosclerotic heart disease of native coronary artery without angina pectoris: Secondary | ICD-10-CM

## 2020-08-15 DIAGNOSIS — N186 End stage renal disease: Secondary | ICD-10-CM | POA: Diagnosis not present

## 2020-08-15 DIAGNOSIS — I4892 Unspecified atrial flutter: Secondary | ICD-10-CM

## 2020-08-15 DIAGNOSIS — D539 Nutritional anemia, unspecified: Secondary | ICD-10-CM

## 2020-08-15 DIAGNOSIS — D696 Thrombocytopenia, unspecified: Secondary | ICD-10-CM

## 2020-08-15 DIAGNOSIS — Z952 Presence of prosthetic heart valve: Secondary | ICD-10-CM

## 2020-08-15 DIAGNOSIS — Z992 Dependence on renal dialysis: Secondary | ICD-10-CM | POA: Diagnosis not present

## 2020-08-15 DIAGNOSIS — I5020 Unspecified systolic (congestive) heart failure: Secondary | ICD-10-CM

## 2020-08-15 HISTORY — PX: CARDIOVERSION: SHX1299

## 2020-08-15 LAB — CBC
HCT: 36 % — ABNORMAL LOW (ref 39.0–52.0)
Hemoglobin: 11 g/dL — ABNORMAL LOW (ref 13.0–17.0)
MCH: 31.3 pg (ref 26.0–34.0)
MCHC: 30.6 g/dL (ref 30.0–36.0)
MCV: 102.6 fL — ABNORMAL HIGH (ref 80.0–100.0)
Platelets: 89 10*3/uL — ABNORMAL LOW (ref 150–400)
RBC: 3.51 MIL/uL — ABNORMAL LOW (ref 4.22–5.81)
RDW: 17.3 % — ABNORMAL HIGH (ref 11.5–15.5)
WBC: 5.7 10*3/uL (ref 4.0–10.5)
nRBC: 0.4 % — ABNORMAL HIGH (ref 0.0–0.2)

## 2020-08-15 LAB — COMPREHENSIVE METABOLIC PANEL
ALT: 291 U/L — ABNORMAL HIGH (ref 0–44)
AST: 235 U/L — ABNORMAL HIGH (ref 15–41)
Albumin: 2.9 g/dL — ABNORMAL LOW (ref 3.5–5.0)
Alkaline Phosphatase: 135 U/L — ABNORMAL HIGH (ref 38–126)
Anion gap: 15 (ref 5–15)
BUN: 41 mg/dL — ABNORMAL HIGH (ref 8–23)
CO2: 26 mmol/L (ref 22–32)
Calcium: 8.5 mg/dL — ABNORMAL LOW (ref 8.9–10.3)
Chloride: 93 mmol/L — ABNORMAL LOW (ref 98–111)
Creatinine, Ser: 6.26 mg/dL — ABNORMAL HIGH (ref 0.61–1.24)
GFR, Estimated: 9 mL/min — ABNORMAL LOW (ref >60)
Glucose, Bld: 101 mg/dL — ABNORMAL HIGH (ref 70–99)
Potassium: 4 mmol/L (ref 3.5–5.1)
Sodium: 134 mmol/L — ABNORMAL LOW (ref 135–145)
Total Bilirubin: 1.4 mg/dL — ABNORMAL HIGH (ref 0.3–1.2)
Total Protein: 7 g/dL (ref 6.5–8.1)

## 2020-08-15 LAB — PROTIME-INR
INR: 4.1 (ref 0.8–1.2)
Prothrombin Time: 39.9 seconds — ABNORMAL HIGH (ref 11.4–15.2)

## 2020-08-15 SURGERY — CARDIOVERSION
Anesthesia: General

## 2020-08-15 MED ORDER — AMIODARONE HCL 200 MG PO TABS
200.0000 mg | ORAL_TABLET | Freq: Two times a day (BID) | ORAL | Status: DC
  Filled 2020-08-15: qty 1

## 2020-08-15 MED ORDER — SODIUM CHLORIDE 0.9 % IV SOLN: INTRAVENOUS | Status: DC | PRN

## 2020-08-15 MED ORDER — AMIODARONE HCL 200 MG PO TABS
400.0000 mg | ORAL_TABLET | Freq: Two times a day (BID) | ORAL | Status: DC
  Administered 2020-08-15 – 2020-08-16 (×3): 400 mg via ORAL
  Filled 2020-08-15 (×4): qty 2

## 2020-08-15 MED ORDER — PROPOFOL 10 MG/ML IV BOLUS
INTRAVENOUS | Status: DC | PRN
  Administered 2020-08-15: 10 mg via INTRAVENOUS
  Administered 2020-08-15: 50 mg via INTRAVENOUS

## 2020-08-15 MED ORDER — METOPROLOL SUCCINATE ER 25 MG PO TB24
25.0000 mg | ORAL_TABLET | Freq: Every day | ORAL | Status: DC
  Administered 2020-08-16: 25 mg via ORAL
  Filled 2020-08-15: qty 1

## 2020-08-15 NOTE — Anesthesia Postprocedure Evaluation (Signed)
Anesthesia Post Note  Patient: Jeremy [name]  Procedure(s) Performed: CARDIOVERSION (N/A )  Patient location during evaluation: PACU Anesthesia Type: General Level of consciousness: awake and alert Pain management: pain level controlled Vital Signs Assessment: post-procedure vital signs reviewed and stable Respiratory status: spontaneous breathing and respiratory function stable Cardiovascular status: stable Anesthetic complications: no   No complications documented.   Last Vitals:  Vitals:   08/15/20 0842 08/15/20 0848  BP: (!) 81/50 (!) 86/55  Pulse: (!) 51 (!) 52  Resp: (!) 22 (!) 22  Temp:    SpO2: 100% 100%    Last Pain:  Vitals:   08/15/20 0848  TempSrc:   PainSc: 0-No pain                 Prentice Sackrider K

## 2020-08-15 NOTE — Progress Notes (Signed)
[facility]  ROUNDING NOTE   Subjective:   [name] was admitted to [facility] on 08/12/2020 for Hyperkalemia [E87.5] Wide-complex tachycardia ([place]) [I47.2] Fluid overload [E87.70]  Last hemodialysis treatment was 5/6. Patient completed his dialysis treatment. Patient was at his estimated dry weight at the end of treatment, 70.5 Kg  Patient was getting more short of breath since yesterday. He has been more weak. He denies any changes to his diet but states he is constipated.   Patient with V tach on the field. Was given IV amiodarone and 1 g of calcium. He was then given 2 g of calcium, insulin and dextrose when brought to the ED. Follow up K of 5.6.   Patient seen resting in bed after procedure Alert and oriented Says procedure went well Tolerated breakfast Denies shortness of breath Denies pain   Objective:  Vital signs in last 24 hours:  Temp:  [97.2 F (36.2 C)-97.8 F (36.6 C)] 97.7 F (36.5 C) (05/11 0801) Pulse Rate:  [39-112] 67 (05/11 0952) Resp:  [0-30] 16 (05/11 0952) BP: (80-112)/(50-87) 95/63 (05/11 0952) SpO2:  [96 %-100 %] 100 % (05/11 0952) Weight:  [71 kg] 71 kg (05/11 0520)  Weight change: -0.1 kg Filed Weights   08/13/20 0406 08/14/20 0529 08/15/20 0520  Weight: 70.9 kg 71.1 kg 71 kg    Intake/Output: I/O last 3 completed shifts: In: 480 [P.O.:480] Out: 1 [Stool:1]   Intake/Output this shift:  Total I/O In: 50 [I.V.:50] Out: -   Physical Exam: General: NAD, resting in bed  Head: Normocephalic, atraumatic. Moist oral mucosal membranes  Eyes: Anicteric  Lungs:  Diminished bilaterally  Heart: Irregular, +murmur  Abdomen:  Soft, nontender,   Extremities:  no edema, Right BKA with clean dressings.   Neurologic: Nonfocal, moving all four extremities  Skin: No lesions  Access: Left AVF    Basic Metabolic Panel: Recent Labs  Lab 08/12/20 0946 08/13/20 0510 08/14/20 0540 08/15/20 0454  NA 136 137 134* 134*  K 5.6* 4.5 4.1  4.0  CL 92* 94* 91* 93*  CO2 19* 29 28 26   GLUCOSE 55* 72 114* 101*  BUN 45* 38* 31* 41*  CREATININE 7.47* 5.76* 4.87* 6.26*  CALCIUM 10.5* 9.3 8.8* 8.5*    Liver Function Tests: Recent Labs  Lab 08/12/20 0946 08/15/20 0454  AST 105* 235*  ALT 96* 291*  ALKPHOS 179* 135*  BILITOT 2.0* 1.4*  PROT 7.5 7.0  ALBUMIN 3.1* 2.9*   No results for input(s): LIPASE, AMYLASE in the last 168 hours. No results for input(s): AMMONIA in the last 168 hours.  CBC: Recent Labs  Lab 08/12/20 0946 08/13/20 0510 08/15/20 0454  WBC 13.4* 6.7 5.7  NEUTROABS 5.9  --   --   HGB 12.3* 11.8* 11.0*  HCT 42.2 38.9* 36.0*  MCV 108.2* 102.4* 102.6*  PLT 106* 91* 89*    Cardiac Enzymes: No results for input(s): CKTOTAL, CKMB, CKMBINDEX, TROPONINI in the last 168 hours.  BNP: Invalid input(s): POCBNP  CBG: Recent Labs  Lab 08/12/20 1244 08/12/20 1300 08/12/20 1627 08/12/20 1915  GLUCAP 23* 143* 109* 102*    Microbiology: Results for orders placed or performed during the hospital encounter of 08/12/20  Resp Panel by RT-PCR (Flu A&B, Covid) Nasopharyngeal Swab     Status: None   Collection Time: 08/12/20  9:46 AM   Specimen: Nasopharyngeal Swab; Nasopharyngeal(NP) swabs in vial transport medium  Result Value Ref Range Status   SARS Coronavirus 2 by RT  PCR NEGATIVE NEGATIVE Final    Comment: (NOTE) SARS-CoV-2 target nucleic acids are NOT DETECTED.  The SARS-CoV-2 RNA is generally detectable in upper respiratory specimens during the acute phase of infection. The lowest concentration of SARS-CoV-2 viral copies this assay can detect is 138 copies/mL. A negative result does not preclude SARS-Cov-2 infection and should not be used as the sole basis for treatment or other patient management decisions. A negative result may occur with  improper specimen collection/handling, submission of specimen other than nasopharyngeal swab, presence of viral mutation(s) within the areas targeted by  this assay, and inadequate number of viral copies(<138 copies/mL). A negative result must be combined with clinical observations, patient history, and epidemiological information. The expected result is Negative.  Fact Sheet for Patients:  EntrepreneurPulse.com.au  Fact Sheet for Healthcare Providers:  IncredibleEmployment.be  This test is no t yet approved or cleared by the [place] FDA and  has been authorized for detection and/or diagnosis of SARS-CoV-2 by FDA under an Emergency Use Authorization (EUA). This EUA will remain  in effect (meaning this test can be used) for the duration of the COVID-19 declaration under Section 564(b)(1) of the Act, 21 U.S.C.section 360bbb-3(b)(1), unless the authorization is terminated  or revoked sooner.       Influenza A by PCR NEGATIVE NEGATIVE Final   Influenza B by PCR NEGATIVE NEGATIVE Final    Comment: (NOTE) The Xpert Xpress SARS-CoV-2/FLU/RSV plus assay is intended as an aid in the diagnosis of influenza from Nasopharyngeal swab specimens and should not be used as a sole basis for treatment. Nasal washings and aspirates are unacceptable for Xpert Xpress SARS-CoV-2/FLU/RSV testing.  Fact Sheet for Patients: EntrepreneurPulse.com.au  Fact Sheet for Healthcare Providers: IncredibleEmployment.be  This test is not yet approved or cleared by the [place] FDA and has been authorized for detection and/or diagnosis of SARS-CoV-2 by FDA under an Emergency Use Authorization (EUA). This EUA will remain in effect (meaning this test can be used) for the duration of the COVID-19 declaration under Section 564(b)(1) of the Act, 21 U.S.C. section 360bbb-3(b)(1), unless the authorization is terminated or revoked.  Performed at [facility], [address] [place], [place] [address]   Culture, blood (Routine X 2) w Reflex to ID Panel     Status: None  (Preliminary result)   Collection Time: 08/12/20  9:49 PM   Specimen: BLOOD  Result Value Ref Range Status   Specimen Description BLOOD BLOOD RIGHT FOREARM  Final   Special Requests   Final    BOTTLES DRAWN AEROBIC AND ANAEROBIC Blood Culture adequate volume   Culture   Final    NO GROWTH 3 DAYS Performed at [facility], [address] [place], [place] [address]    Report Status PENDING  Incomplete  Culture, blood (Routine X 2) w Reflex to ID Panel     Status: None (Preliminary result)   Collection Time: 08/12/20  9:50 PM   Specimen: BLOOD  Result Value Ref Range Status   Specimen Description BLOOD  RIGHT HAND  Final   Special Requests   Final    BOTTLES DRAWN AEROBIC AND ANAEROBIC Blood Culture adequate volume   Culture   Final    NO GROWTH 3 DAYS Performed at [facility], [address], [place], [place] [address]    Report Status PENDING  Incomplete   *Note: Due to a large number of results and/or encounters for the requested time period, some results have not been displayed. A  complete set of results can be found in Results Review.    Coagulation Studies: Recent Labs    08/13/20 0510 08/14/20 0540  LABPROT 42.5* 33.4*  INR 4.5* 3.3*    Urinalysis: No results for input(s): COLORURINE, LABSPEC, PHURINE, GLUCOSEU, HGBUR, BILIRUBINUR, KETONESUR, PROTEINUR, UROBILINOGEN, NITRITE, LEUKOCYTESUR in the last 72 hours.  Invalid input(s): APPERANCEUR    Imaging: ECHOCARDIOGRAM COMPLETE  Result Date: 08/14/2020    ECHOCARDIOGRAM REPORT   Patient Name:   Jeremy [name] Date of Exam: 08/14/2020 Medical Rec #:  [id number]   Height:       70.0 in Accession #:    [id number]  Weight:       156.7 lb Date of Birth:  [birth year]   BSA:          1.882 m Patient Age:    30 years    BP:           89/67 mmHg Patient Gender: M           HR:           114 bpm. Exam Location:  [facility] Procedure: 2D Echo, Cardiac Doppler and Color Doppler Indications:     Ventriculat Tachycardia  I47.2  History:         Patient has prior history of Echocardiogram examinations, most                  recent 10/01/2019. Previous Myocardial Infarction. ESRD, PAF.  Sonographer:     Sherrie Sport RDCS (AE) Referring Phys:  [place] Phys: Kate Sable MD IMPRESSIONS  1. Left ventricular ejection fraction, by estimation, is 25 to 30%. The left ventricle has severely decreased function. The left ventricle demonstrates global hypokinesis. There is mild left ventricular hypertrophy. Left ventricular diastolic parameters  are indeterminate.  2. Right ventricular systolic function is severely reduced. The right ventricular size is moderately enlarged.  3. Left atrial size was severely dilated.  4. The mitral valve is degenerative. Mild mitral valve regurgitation. Moderate mitral annular calcification.  5. The aortic valve has been repaired/replaced. Aortic valve regurgitation is not visualized.  6. The inferior vena cava is normal in size with <50% respiratory variability, suggesting right atrial pressure of 8 mmHg. FINDINGS  Left Ventricle: Left ventricular ejection fraction, by estimation, is 25 to 30%. The left ventricle has severely decreased function. The left ventricle demonstrates global hypokinesis. The left ventricular internal cavity size was normal in size. There is mild left ventricular hypertrophy. Left ventricular diastolic parameters are indeterminate. Right Ventricle: The right ventricular size is moderately enlarged. No increase in right ventricular wall thickness. Right ventricular systolic function is severely reduced. Left Atrium: Left atrial size was severely dilated. Right Atrium: Right atrial size was normal in size. Pericardium: There is no evidence of pericardial effusion. Mitral Valve: The mitral valve is degenerative in appearance. Moderate mitral annular calcification. Mild mitral valve regurgitation. Tricuspid Valve: The tricuspid valve is normal in structure.  Tricuspid valve regurgitation is mild. Aortic Valve: The aortic valve has been repaired/replaced. Aortic valve regurgitation is not visualized. Aortic valve mean gradient measures 5.0 mmHg. Aortic valve peak gradient measures 8.4 mmHg. Aortic valve area, by VTI measures 1.97 cm. There is a mechanical valve present in the aortic position. Pulmonic Valve: The pulmonic valve was grossly normal. Pulmonic valve regurgitation is not visualized. Aorta: The aortic root is normal in size and structure. Venous: The inferior vena cava is normal in size with less than 50% respiratory variability,  suggesting right atrial pressure of 8 mmHg. IAS/Shunts: No atrial level shunt detected by color flow Doppler.  LEFT VENTRICLE PLAX 2D LVIDd:         5.57 cm LVIDs:         4.52 cm LV PW:         1.35 cm LV IVS:        1.01 cm LVOT diam:     2.00 cm LV SV:         36 LV SV Index:   19 LVOT Area:     3.14 cm  RIGHT VENTRICLE RV Basal diam:  6.33 cm RV S prime:     4.46 cm/s TAPSE (M-mode): 3.4 cm LEFT ATRIUM              Index       RIGHT ATRIUM           Index LA diam:        5.30 cm  2.82 cm/m  RA Area:     35.50 cm LA Vol (A2C):   178.0 ml 94.58 ml/m RA Volume:   153.00 ml 81.30 ml/m LA Vol (A4C):   163.0 ml 86.61 ml/m LA Biplane Vol: 171.0 ml 90.86 ml/m  AORTIC VALVE                    PULMONIC VALVE AV Area (Vmax):    1.47 cm     PV Vmax:        0.45 m/s AV Area (Vmean):   1.48 cm     PV Peak grad:   0.8 mmHg AV Area (VTI):     1.97 cm     RVOT Peak grad: 2 mmHg AV Vmax:           144.67 cm/s AV Vmean:          105.667 cm/s AV VTI:            0.185 m AV Peak Grad:      8.4 mmHg AV Mean Grad:      5.0 mmHg LVOT Vmax:         67.90 cm/s LVOT Vmean:        49.800 cm/s LVOT VTI:          0.116 m LVOT/AV VTI ratio: 0.63  AORTA Ao Root diam: 3.20 cm MITRAL VALVE                TRICUSPID VALVE MV Area (PHT): 3.87 cm     TR Peak grad:   31.8 mmHg MV Decel Time: 196 msec     TR Vmax:        282.00 cm/s MV E velocity: 129.00 cm/s                              SHUNTS                             Systemic VTI:  0.12 m                             Systemic Diam: 2.00 cm Kate Sable MD Electronically signed by Kate Sable MD Signature Date/Time: 08/14/2020/12:10:33 PM    Final      Medications:   . sodium chloride 250 mL (08/15/20 0820)  . amiodarone 30 mg/hr (08/14/20 2323)   .  allopurinol  100 mg Oral Daily  . cinacalcet  30 mg Oral Q supper  . clopidogrel  75 mg Oral Q breakfast  . ezetimibe  10 mg Oral Daily  . hydroxychloroquine  400 mg Oral QHS  . loratadine  10 mg Oral Daily  . metoprolol tartrate  50 mg Oral BID  . midodrine  10 mg Oral TID WC  . multivitamin  1 tablet Oral Daily  . pantoprazole  40 mg Oral Daily  . rosuvastatin  40 mg Oral q1800  . sevelamer carbonate  3,200 mg Oral TID WC  . sodium chloride flush  3 mL Intravenous Q12H  . Warfarin - Pharmacist Dosing Inpatient   Does not apply q1600   sodium chloride, albuterol, dextromethorphan-guaiFENesin, dextrose, hydrALAZINE, hydrOXYzine, lidocaine-prilocaine, loperamide, nitroGLYCERIN, oxyCODONE-acetaminophen, phenylephrine, polyethylene glycol, simethicone, sodium chloride flush, traZODone  Assessment/ Plan:  [name] is a 65 y.o. white male with end stage renal disease on hemodialysis secondary go IgA nephropathy, status post kidney transplant x 2, renal cell carcinoma of renal transplant kidney, coronary artery disease, atrial fibrillation, sleep apnea, peripheral vascular disease, hypertension, gout, congestive heart failure, aortic valve replacement on chronic anticoagulation with warfarin who was admitted to [facility] on 08/12/2020 for Hyperkalemia [E87.5] Wide-complex tachycardia ([place]) [I47.2] Fluid overload [E87.70]  [facility]rology MWF [facility] [address] Left AVF 70.5kg  1. End Stage Renal Disease with fluid overload and hyperkalemia - Receiving dialysis today after Cardioversion - Next treatment scheduled for Friday   2.  Hypotension in setting of A-flutter with RVR:  - Midodrine TID  - Amiodarone drip started - Successful Cardioversion today  3. Anemia of chronic kidney disease: hemoglobin 11.8, Macrocytic. - Retacrit as outpatient.  - Remains at an acceptable range  4. Secondary Hyperparathyroidism:  - sevelamer with meals - cinacalcet.    LOS: 3 Aleksa Collinsworth 5/11/202211:06 AM

## 2020-08-15 NOTE — Telephone Encounter (Signed)
  Chronic Care Management   Outreach Note  08/15/2020 Name: Jeremy [name] MRN: [id number] DOB: 1956/02/15  Referred by: Leone Haven, MD Reason for referral : Chronic Care Management (RNCM Initial)   Upon chart review patient noted to be hospitalized and undergong procedure today.  Appointment for CCM RNCM initial assessment will be rescheduled.  Follow Up Plan: RNCM will reschedule initial assessment within the next 10 business days.  Will monitor chart for discharge date.  Jeremy Azure RN, MSN RN Care Management Coordinator [place] [phone] [email or web address]

## 2020-08-15 NOTE — Anesthesia Preprocedure Evaluation (Signed)
Anesthesia Evaluation  Patient identified by MRN, date of birth, ID band Patient awake    Reviewed: Allergy & Precautions, NPO status , Patient's Chart, lab work & pertinent test results  History of Anesthesia Complications (+) PONV and history of anesthetic complications  Airway Mallampati: II       Dental   Pulmonary sleep apnea and Continuous Positive Airway Pressure Ventilation , neg COPD, Not current smoker,           Cardiovascular hypertension, + CAD, + Past MI, + Peripheral Vascular Disease and +CHF  + dysrhythmias Atrial Fibrillation + Valvular Problems/Murmurs (s/p Aortic valve repalcement)      Neuro/Psych neg Seizures Anxiety Depression    GI/Hepatic Neg liver ROS, neg GERD  ,  Endo/Other  neg diabetes  Renal/GU ESRF and DialysisRenal disease     Musculoskeletal   Abdominal   Peds  Hematology  (+) anemia ,   Anesthesia Other Findings   Reproductive/Obstetrics                            Anesthesia Physical Anesthesia Plan  ASA: IV  Anesthesia Plan: General   Post-op Pain Management:    Induction: Intravenous  PONV Risk Score and Plan: 3 and Propofol infusion and TIVA  Airway Management Planned: Nasal Cannula  Additional Equipment:   Intra-op Plan:   Post-operative Plan:   Informed Consent: I have reviewed the patients History and Physical, chart, labs and discussed the procedure including the risks, benefits and alternatives for the proposed anesthesia with the patient or authorized representative who has indicated his/her understanding and acceptance.       Plan Discussed with:   Anesthesia Plan Comments:         Anesthesia Quick Evaluation

## 2020-08-15 NOTE — Consult Note (Signed)
ANTICOAGULATION CONSULT NOTE - Initial Consult  Pharmacy Consult for warfarin Indication: s/p of AVR with mechanical valve  Allergies  Allergen Reactions  . Statins Other (See Comments)    Arthralgias (intolerance), Myalgias (intolerance)  Pt taking pravastatin   . Gabapentin     Drowsy   . Sertraline Rash    Patient Measurements: Height: 5\' 10"  (177.8 cm) Weight: 71 kg (156 lb 8.4 oz) IBW/kg (Calculated) : 73   Vital Signs: Temp: 96.4 F (35.8 C) (05/11 1215) Temp Source: Oral (05/11 0801) BP: 127/65 (05/11 1430) Pulse Rate: 63 (05/11 1430)  Labs: Recent Labs    08/12/20 2149 08/13/20 0510 08/14/20 0540 08/15/20 0454 08/15/20 1505  HGB  --  11.8*  --  11.0*  --   HCT  --  38.9*  --  36.0*  --   PLT  --  91*  --  89*  --   LABPROT  --  42.5* 33.4*  --  39.9*  INR  --  4.5* 3.3*  --  4.1*  CREATININE  --  5.76* 4.87* 6.26*  --   TROPONINIHS 182*  --   --   --   --     Estimated Creatinine Clearance: 12.1 mL/min (A) (by C-G formula based on SCr of 6.26 mg/dL (H)).   Medical History: Past Medical History:  Diagnosis Date  . Abnormal stress test 03/19/2018  . 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 Anxiety    situation  . Aortic stenosis    a. 2014 s/p mech AVR ([facility])-->chronic coumadin; b. 08/2018 Echo: Mild AS/AI. Nl fxn/gradient; c. 09/2019 Echo: EF 45-50%, AoV mean grad 47mmHg.  . Arthritis   . CAD (coronary artery disease)    a. 2013 PCI: LAD 81m (2.75x28 Xience DES); b. 06/2018 Cath: LM min irregs, LAD 60p, 95/30/71m, LCX min irregs, RCA 40p, 15m, 90d, RPAV 90, RPDA 95ost; d. 09/2018 CABGx3 (LIMA->LAD, VG->D1, VG->PDA); e. 07/2019 PCI: LAD 90p, 73m, 87m ISR, 40m, D2 fills via L->L collats, LCX mild diff dzs, RCA 40p, 62m, 99d, RPAV 99, RPDA 95ost, VG->D2 100, LIMA->LAD ok, VG->RPDA 99 (4.5x15 Resolute Onyx DES).  . Chronic anticoagulation 06/2012   a. Coumadin in the setting of mech AVR and afib.  . Complication of anesthesia   . COVID-19 03/15/2019  .  CPAP (continuous positive airway pressure) dependence   . Dialysis patient [facility]) 04/07/2006   Patient started HD around 1996,  had 1st transplant LLQ around 1998 until 2007 when they found renal cell cancer in transplant and both native kidneys, all were removed > went back on HD until 2nd transplant RLQ in 2014 which lasted 3 yrs; it was embolized for suspected acute rejection. Is currently on HD MWF in [place] w/ [facility] nephrology.  . ESRD (end stage renal disease) ([place])    a. CKD 2/2 to IgA nephropathy (dx age 31); b. 1999 s/p Kidney transplant; c. 2014 Bilat nephrectomy (transplanted kidney resected) in the setting of renal cell carcinoma; d. PD until 11/2017-->HD.  Marland Kitchen Heart murmur   . HFrEF (heart failure with reduced ejection fraction) ([place])    a. 07/2016 Echo: EF 55-60%; b. 02/2018 MV: EF 30%; c. 04/2018 Echo: EF 35%; d. 08/2018 Echo: EF 30-35%. Glob HK w/o rwma; e. 09/2019 Echo: EF 45-50%, mod LVH, basal-mid inf/inflat HK, nl RV fxn, mod dil LA, mildly dil RA. Nl AoV prosthesis (mean grad 46mmHg).  . History of blood transfusion   . Hx of colonoscopy    2009  by Dr. Laural Golden. Normal except for small submucosal lipoma. No evidence of polyps or colitis.  . Hyperlipidemia   . Hypertension   . Hypogonadism in male 07/25/2015  . Ischemic cardiomyopathy    a. 07/2016 Echo: EF 55-60%; b. 02/2018 MV: EF 30%; c. 04/2018 Echo: EF 35%; d. 08/2018 Echo: EF 30-35%; e.  09/2019 Echo: EF 45-50%, mod LVH, basal-mid inf/inflat HK.  . Myocardial infarction ([place])   . OSA (obstructive sleep apnea)    No CPAP  . Paroxysmal atrial flutter ([place])   . Peripheral vascular disease ([place])    a. 09/2019 s/p L SFA and L profunda stenting; b. 12/2019 s/p R PT and TP trunk PTA; c. 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  Marland Kitchen Permanent atrial fibrillation ([facility])    a. CHA2DS2VASc = 3-->Coumadin (also mech AVR); b. 06/2018 Amio started 2/2 elevated rates.  Marland Kitchen PONV (postoperative nausea and vomiting)   . Renal cell carcinoma ([place]) 08/04/2016     Medications:  PTA: Warfarin 1 mg daily, Friday dose may change depending on reading per chart review.   No DDI concerns at this time  Assessment: 65 yo M presenting with chest pain and shortness of breath. 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 Pharmacy has been consulted to dose and monitor warfarin.   Goal of Therapy:  INR 2.5-3.5  (per chart review) Monitor platelets by anticoagulation protocol: Yes   Date INR Dose 5/8 4.2  Hold 5/9 4.5 Hold 5/10 3.3 0.5mg  given 5/11 4.1 Hold  Plan:  INR supratherapeutic. Will hold dose for today and recheck with AM labs.  Of note, patient received 1 time dose of amiodarone(unsure of strength) during transportation to hospital (seemingly large effect on INR level), and pt volume overloaded with likely hepatic congestion & slightly dec' albumin. No other notable DDI's. Recheck INR and CBC with AM labs  Pearla Dubonnet, PharmD  08/15/2020 4:00 PM

## 2020-08-15 NOTE — Progress Notes (Signed)
Date and time results received: 08/15/20 1559 (use smartphrase ".now" to insert current time)  Test: INR Critical Value: 4.1  Name of Provider Notified: Dr. Leslye Peer, 2[facility] pharmacy

## 2020-08-15 NOTE — Progress Notes (Signed)
Patient ID: Jeremy [name], male   DOB: 02/23/1956, 65 y.o.   MRN: [id number] [facility] PROGRESS NOTE  ERIE RADU NGE:[id number] DOB: 1955-06-14 DOA: 08/12/2020 PCP: Leone Haven, MD  HPI/Subjective: Patient seen after her cardioversion.  He was feeling better.  Still felt a little bit weak.  Still have a little cough when talking.  Going for dialysis this afternoon.  Patient does not urinate.  Admitted with fluid overload.  Objective: Vitals:   08/15/20 1415 08/15/20 1430  BP: 121/76 127/65  Pulse: (!) 53 63  Resp: 20 (!) 21  Temp:    SpO2:      Intake/Output Summary (Last 24 hours) at 08/15/2020 1553 Last data filed at 08/15/2020 1530 Gross per 24 hour  Intake 50 ml  Output 85 ml  Net -35 ml   Filed Weights   08/13/20 0406 08/14/20 0529 08/15/20 0520  Weight: 70.9 kg 71.1 kg 71 kg    ROS: Review of Systems  Respiratory: Positive for cough. Negative for shortness of breath.   Cardiovascular: Negative for chest pain.  Gastrointestinal: Negative for abdominal pain, nausea and vomiting.   Exam: Physical Exam HENT:     Head: Normocephalic.     Mouth/Throat:     Pharynx: No oropharyngeal exudate.  Eyes:     General: Lids are normal.     Conjunctiva/sclera: Conjunctivae normal.     Pupils: Pupils are equal, round, and reactive to light.  Cardiovascular:     Rate and Rhythm: Normal rate and regular rhythm.     Heart sounds: Normal heart sounds, S1 normal and S2 normal.  Pulmonary:     Breath sounds: Examination of the right-lower field reveals decreased breath sounds. Examination of the left-lower field reveals decreased breath sounds. Decreased breath sounds present. No wheezing, rhonchi or rales.  Abdominal:     Palpations: Abdomen is soft.     Tenderness: There is no abdominal tenderness.  Musculoskeletal:     Right upper leg: Swelling present.     Left lower leg: No swelling.  Skin:    General: Skin is warm.     Findings: No rash.  Neurological:      Mental Status: He is alert and oriented to person, place, and time.       Data Reviewed: Basic Metabolic Panel: Recent Labs  Lab 08/12/20 0946 08/13/20 0510 08/14/20 0540 08/15/20 0454  NA 136 137 134* 134*  K 5.6* 4.5 4.1 4.0  CL 92* 94* 91* 93*  CO2 19* 29 28 26   GLUCOSE 55* 72 114* 101*  BUN 45* 38* 31* 41*  CREATININE 7.47* 5.76* 4.87* 6.26*  CALCIUM 10.5* 9.3 8.8* 8.5*   Liver Function Tests: Recent Labs  Lab 08/12/20 0946 08/15/20 0454  AST 105* 235*  ALT 96* 291*  ALKPHOS 179* 135*  BILITOT 2.0* 1.4*  PROT 7.5 7.0  ALBUMIN 3.1* 2.9*   CBC: Recent Labs  Lab 08/12/20 0946 08/13/20 0510 08/15/20 0454  WBC 13.4* 6.7 5.7  NEUTROABS 5.9  --   --   HGB 12.3* 11.8* 11.0*  HCT 42.2 38.9* 36.0*  MCV 108.2* 102.4* 102.6*  PLT 106* 91* 89*    CBG: Recent Labs  Lab 08/12/20 1244 08/12/20 1300 08/12/20 1627 08/12/20 1915  GLUCAP 23* 143* 109* 102*    Recent Results (from the past 240 hour(s))  Resp Panel by RT-PCR (Flu A&B, Covid) Nasopharyngeal Swab     Status: None   Collection Time: 08/12/20  9:46 AM  Specimen: Nasopharyngeal Swab; Nasopharyngeal(NP) swabs in vial transport medium  Result Value Ref Range Status   SARS Coronavirus 2 by RT PCR NEGATIVE NEGATIVE Final    Comment: (NOTE) SARS-CoV-2 target nucleic acids are NOT DETECTED.  The SARS-CoV-2 RNA is generally detectable in upper respiratory specimens during the acute phase of infection. The lowest concentration of SARS-CoV-2 viral copies this assay can detect is 138 copies/mL. A negative result does not preclude SARS-Cov-2 infection and should not be used as the sole basis for treatment or other patient management decisions. A negative result may occur with  improper specimen collection/handling, submission of specimen other than nasopharyngeal swab, presence of viral mutation(s) within the areas targeted by this assay, and inadequate number of viral copies(<138 copies/mL). A negative  result must be combined with clinical observations, patient history, and epidemiological information. The expected result is Negative.  Fact Sheet for Patients:  EntrepreneurPulse.com.au  Fact Sheet for Healthcare Providers:  IncredibleEmployment.be  This test is no t yet approved or cleared by the [place] FDA and  has been authorized for detection and/or diagnosis of SARS-CoV-2 by FDA under an Emergency Use Authorization (EUA). This EUA will remain  in effect (meaning this test can be used) for the duration of the COVID-19 declaration under Section 564(b)(1) of the Act, 21 U.S.C.section 360bbb-3(b)(1), unless the authorization is terminated  or revoked sooner.       Influenza A by PCR NEGATIVE NEGATIVE Final   Influenza B by PCR NEGATIVE NEGATIVE Final    Comment: (NOTE) The Xpert Xpress SARS-CoV-2/FLU/RSV plus assay is intended as an aid in the diagnosis of influenza from Nasopharyngeal swab specimens and should not be used as a sole basis for treatment. Nasal washings and aspirates are unacceptable for Xpert Xpress SARS-CoV-2/FLU/RSV testing.  Fact Sheet for Patients: EntrepreneurPulse.com.au  Fact Sheet for Healthcare Providers: IncredibleEmployment.be  This test is not yet approved or cleared by the [place] FDA and has been authorized for detection and/or diagnosis of SARS-CoV-2 by FDA under an Emergency Use Authorization (EUA). This EUA will remain in effect (meaning this test can be used) for the duration of the COVID-19 declaration under Section 564(b)(1) of the Act, 21 U.S.C. section 360bbb-3(b)(1), unless the authorization is terminated or revoked.  Performed at [facility], [address] [place], [place] [address]   Culture, blood (Routine X 2) w Reflex to ID Panel     Status: None (Preliminary result)   Collection Time: 08/12/20  9:49 PM   Specimen: BLOOD  Result  Value Ref Range Status   Specimen Description BLOOD BLOOD RIGHT FOREARM  Final   Special Requests   Final    BOTTLES DRAWN AEROBIC AND ANAEROBIC Blood Culture adequate volume   Culture   Final    NO GROWTH 3 DAYS Performed at [facility], [address], [place], [place] [address]    Report Status PENDING  Incomplete  Culture, blood (Routine X 2) w Reflex to ID Panel     Status: None (Preliminary result)   Collection Time: 08/12/20  9:50 PM   Specimen: BLOOD  Result Value Ref Range Status   Specimen Description BLOOD  RIGHT HAND  Final   Special Requests   Final    BOTTLES DRAWN AEROBIC AND ANAEROBIC Blood Culture adequate volume   Culture   Final    NO GROWTH 3 DAYS Performed at [facility], [address] [place], [place] [address]    Report Status PENDING  Incomplete  Studies: ECHOCARDIOGRAM COMPLETE  Result Date: 08/14/2020    ECHOCARDIOGRAM REPORT   Patient Name:   KORON GODEAUX Date of Exam: 08/14/2020 Medical Rec #:  [id number]   Height:       70.0 in Accession #:    [id number]  Weight:       156.7 lb Date of Birth:  11/11/1955   BSA:          1.882 m Patient Age:    9 years    BP:           89/67 mmHg Patient Gender: M           HR:           114 bpm. Exam Location:  [facility] Procedure: 2D Echo, Cardiac Doppler and Color Doppler Indications:     Ventriculat Tachycardia I47.2  History:         Patient has prior history of Echocardiogram examinations, most                  recent 10/01/2019. Previous Myocardial Infarction. ESRD, PAF.  Sonographer:     Sherrie Sport RDCS (AE) Referring Phys:  [place] Phys: Kate Sable MD IMPRESSIONS  1. Left ventricular ejection fraction, by estimation, is 25 to 30%. The left ventricle has severely decreased function. The left ventricle demonstrates global hypokinesis. There is mild left ventricular hypertrophy. Left ventricular diastolic parameters  are indeterminate.  2. Right ventricular systolic  function is severely reduced. The right ventricular size is moderately enlarged.  3. Left atrial size was severely dilated.  4. The mitral valve is degenerative. Mild mitral valve regurgitation. Moderate mitral annular calcification.  5. The aortic valve has been repaired/replaced. Aortic valve regurgitation is not visualized.  6. The inferior vena cava is normal in size with <50% respiratory variability, suggesting right atrial pressure of 8 mmHg. FINDINGS  Left Ventricle: Left ventricular ejection fraction, by estimation, is 25 to 30%. The left ventricle has severely decreased function. The left ventricle demonstrates global hypokinesis. The left ventricular internal cavity size was normal in size. There is mild left ventricular hypertrophy. Left ventricular diastolic parameters are indeterminate. Right Ventricle: The right ventricular size is moderately enlarged. No increase in right ventricular wall thickness. Right ventricular systolic function is severely reduced. Left Atrium: Left atrial size was severely dilated. Right Atrium: Right atrial size was normal in size. Pericardium: There is no evidence of pericardial effusion. Mitral Valve: The mitral valve is degenerative in appearance. Moderate mitral annular calcification. Mild mitral valve regurgitation. Tricuspid Valve: The tricuspid valve is normal in structure. Tricuspid valve regurgitation is mild. Aortic Valve: The aortic valve has been repaired/replaced. Aortic valve regurgitation is not visualized. Aortic valve mean gradient measures 5.0 mmHg. Aortic valve peak gradient measures 8.4 mmHg. Aortic valve area, by VTI measures 1.97 cm. There is a mechanical valve present in the aortic position. Pulmonic Valve: The pulmonic valve was grossly normal. Pulmonic valve regurgitation is not visualized. Aorta: The aortic root is normal in size and structure. Venous: The inferior vena cava is normal in size with less than 50% respiratory variability, suggesting  right atrial pressure of 8 mmHg. IAS/Shunts: No atrial level shunt detected by color flow Doppler.  LEFT VENTRICLE PLAX 2D LVIDd:         5.57 cm LVIDs:         4.52 cm LV PW:         1.35 cm LV IVS:  1.01 cm LVOT diam:     2.00 cm LV SV:         36 LV SV Index:   19 LVOT Area:     3.14 cm  RIGHT VENTRICLE RV Basal diam:  6.33 cm RV S prime:     4.46 cm/s TAPSE (M-mode): 3.4 cm LEFT ATRIUM              Index       RIGHT ATRIUM           Index LA diam:        5.30 cm  2.82 cm/m  RA Area:     35.50 cm LA Vol (A2C):   178.0 ml 94.58 ml/m RA Volume:   153.00 ml 81.30 ml/m LA Vol (A4C):   163.0 ml 86.61 ml/m LA Biplane Vol: 171.0 ml 90.86 ml/m  AORTIC VALVE                    PULMONIC VALVE AV Area (Vmax):    1.47 cm     PV Vmax:        0.45 m/s AV Area (Vmean):   1.48 cm     PV Peak grad:   0.8 mmHg AV Area (VTI):     1.97 cm     RVOT Peak grad: 2 mmHg AV Vmax:           144.67 cm/s AV Vmean:          105.667 cm/s AV VTI:            0.185 m AV Peak Grad:      8.4 mmHg AV Mean Grad:      5.0 mmHg LVOT Vmax:         67.90 cm/s LVOT Vmean:        49.800 cm/s LVOT VTI:          0.116 m LVOT/AV VTI ratio: 0.63  AORTA Ao Root diam: 3.20 cm MITRAL VALVE                TRICUSPID VALVE MV Area (PHT): 3.87 cm     TR Peak grad:   31.8 mmHg MV Decel Time: 196 msec     TR Vmax:        282.00 cm/s MV E velocity: 129.00 cm/s                             SHUNTS                             Systemic VTI:  0.12 m                             Systemic Diam: 2.00 cm Kate Sable MD Electronically signed by Kate Sable MD Signature Date/Time: 08/14/2020/12:10:33 PM    Final     Scheduled Meds: . allopurinol  100 mg Oral Daily  . amiodarone  400 mg Oral BID  . cinacalcet  30 mg Oral Q supper  . clopidogrel  75 mg Oral Q breakfast  . ezetimibe  10 mg Oral Daily  . hydroxychloroquine  400 mg Oral QHS  . loratadine  10 mg Oral Daily  . [START ON 08/16/2020] metoprolol succinate  25 mg Oral Daily  . midodrine   10 mg Oral TID WC  . multivitamin  1 tablet Oral Daily  . pantoprazole  40 mg Oral Daily  . rosuvastatin  40 mg Oral q1800  . sevelamer carbonate  3,200 mg Oral TID WC  . sodium chloride flush  3 mL Intravenous Q12H  . Warfarin - Pharmacist Dosing Inpatient   Does not apply q1600   Continuous Infusions: . sodium chloride 250 mL (08/15/20 0820)    Assessment/Plan:  1. Acute on chronic systolic congestive heart failure with fluid overload.  Dialysis today to remove fluid.  Still coughing with a deep breath.  Most recent echo with an EF of 25 to 30%.  Since the patient does not urinate dialysis is the only way to remove fluid. 2. Atrial flutter SVT.  Patient had a successful cardioversion today.  Amiodarone drip will be converted over to oral amiodarone.  Patient on warfarin and pharmacy adjusting dose. 3. Aortic valve replacement with metallic valve secondary to aortic stenosis on warfarin 4. End-stage renal disease on hemodialysis 5. Elevated liver function test could be secondary to passive congestion with heart failure versus amiodarone.  Recheck liver function test again tomorrow. 6. Thrombocytopenia.  Check platelet count again tomorrow.  Check a hepatitis C. 7. Hyperkalemia on presentation improved with dialysis 8. Hypoglycemia on presentation improved with D50 9. Gout on allopurinol 10. Macrocytic anemia.  B12 low normal range.        Code Status:     Code Status Orders  (From admission, onward)         Start     Ordered   08/12/20 1918  Full code  Continuous        08/12/20 1917        Code Status History    Date Active Date Inactive Code Status Order ID Comments User Context   07/03/2020 0442 07/08/2020 2154 Full Code [id number]  Elwyn Reach, MD Inpatient   04/23/2020 2345 05/03/2020 1718 Full Code [id number]  Bary Leriche, PA-C Inpatient   12/21/2019 1543 12/24/2019 1637 Full Code [id number]  Gabriel Earing, PA-C Inpatient   09/29/2019 1650 10/07/2019 2253 Full  Code [id number]  Leonides Schanz Inpatient   08/25/2019 1212 08/25/2019 1854 Full Code [id number]  Algernon Huxley, MD Inpatient   07/13/2019 1752 07/18/2019 1531 Full Code [id number]  Loel Dubonnet, NP Inpatient   03/15/2019 1257 03/19/2019 1417 Full Code [id number]  Ivor Costa, MD ED   09/13/2018 1653 09/22/2018 1756 Full Code [id number]  Wellington Hampshire, MD Inpatient   06/07/2018 1423 06/07/2018 1812 Full Code [id number]  Wellington Hampshire, MD Inpatient   07/17/2016 1848 07/22/2016 1741 Full Code [id number]  Baxter Hire, MD Inpatient   Advance Care Planning Activity     Family Communication: Spoke with patient's wife on the phone Disposition Plan: Status is: Inpatient  Dispo: The patient is from: Home              Anticipated d/c is to: Home              Patient currently doing better than when he came in   Difficult to place patient.  No.  Consultants:  Cardiology  Nephrology  Procedures:  Cardioversion  Time spent: 28 minutes  [place]

## 2020-08-15 NOTE — Procedures (Signed)
Cardioversion procedure note For atrial flutter  Procedure Details:  Consent: Risks of procedure as well as the alternatives and risks of each were explained to the (patient/caregiver).  Consent for procedure obtained.  Time Out: Verified patient identification, verified procedure, site/side was marked, verified correct patient position, special equipment/implants available, medications/allergies/relevent history reviewed, required imaging and test results available.  Performed  Patient placed on cardiac monitor, pulse oximetry, supplemental oxygen as necessary.   Sedation given: propofol IV per anesthesia team  Pacer pads placed anterior and posterior chest.   Cardioverted 1 time(s).   Cardioverted at  [place]. Synchronized biphasic Converted to NSR   Evaluation: Findings: Post procedure EKG shows: NSR Complications: None Patient did tolerate procedure well.  Time Spent Directly with the Patient:  35 minutes   Kate Sable, M.D.

## 2020-08-15 NOTE — Progress Notes (Addendum)
OT Cancellation Note  Patient Details Name: Jeremy [name] MRN: [id number] DOB: 23-Jan-1956   Cancelled Treatment:    Reason Eval/Treat Not Completed: Patient at procedure or test/ unavailable. Pt currently at HD. OT to attempt to evaluate pt at later time/date as able.  Fredirick Maudlin, OTR/L [place]

## 2020-08-15 NOTE — Progress Notes (Signed)
Progress Note  Patient Name: Jeremy [name] Date of Encounter: 08/15/2020  [facility] Cardiologist: Kathlyn Sacramento, MD   Subjective   Seen in procedural area. DC cardioversion planned. Denies cp or sob. No acute events overnight  Inpatient Medications    Scheduled Meds: . [MAR Hold] allopurinol  100 mg Oral Daily  . [MAR Hold] cinacalcet  30 mg Oral Q supper  . [MAR Hold] clopidogrel  75 mg Oral Q breakfast  . [MAR Hold] ezetimibe  10 mg Oral Daily  . [MAR Hold] hydroxychloroquine  400 mg Oral QHS  . [MAR Hold] loratadine  10 mg Oral Daily  . [MAR Hold] metoprolol tartrate  50 mg Oral BID  . [MAR Hold] midodrine  10 mg Oral TID WC  . [MAR Hold] multivitamin  1 tablet Oral Daily  . [MAR Hold] pantoprazole  40 mg Oral Daily  . [MAR Hold] rosuvastatin  40 mg Oral q1800  . [MAR Hold] sevelamer carbonate  3,200 mg Oral TID WC  . [MAR Hold] sodium chloride flush  3 mL Intravenous Q12H  . [MAR Hold] Warfarin - Pharmacist Dosing Inpatient   Does not apply q1600   Continuous Infusions: . [MAR Hold] sodium chloride 250 mL (08/15/20 0820)  . amiodarone 30 mg/hr (08/14/20 2323)   PRN Meds: [XBD Hold] sodium chloride, [MAR Hold] albuterol, [MAR Hold] dextromethorphan-guaiFENesin, [MAR Hold] dextrose, [MAR Hold] hydrALAZINE, [MAR Hold] hydrOXYzine, [MAR Hold] lidocaine-prilocaine, [MAR Hold] loperamide, [MAR Hold] nitroGLYCERIN, [MAR Hold] oxyCODONE-acetaminophen, [MAR Hold] phenylephrine, [MAR Hold] polyethylene glycol, [MAR Hold] simethicone, [MAR Hold] sodium chloride flush, [MAR Hold] traZODone   Vital Signs    Vitals:   08/14/20 1951 08/15/20 0326 08/15/20 0520 08/15/20 0801  BP: 101/70 112/72  110/87  Pulse: 100 (!) 103  95  Resp: 17 16  17   Temp: 97.7 F (36.5 C) (!) 97.2 F (36.2 C)  97.7 F (36.5 C)  TempSrc: Oral   Oral  SpO2: 100% 100%  96%  Weight:   71 kg   Height:        Intake/Output Summary (Last 24 hours) at 08/15/2020 0830 Last data filed at 08/14/2020  1330 Gross per 24 hour  Intake 480 ml  Output --  Net 480 ml   Last 3 Weights 08/15/2020 08/14/2020 08/13/2020  Weight (lbs) 156 lb 8.4 oz 156 lb 12 oz 156 lb 4.9 oz  Weight (kg) 71 kg 71.1 kg 70.9 kg      Telemetry    SVT/a flutter heart rate 102- Personally Reviewed  Post cardioversion SR with PAC's  ECG    No new tracing reviewed- Personally Reviewed  Physical Exam   GEN: No acute distress.   Neck: No JVD Cardiac:  Irregular, mechanical click.  Respiratory: Clear anteriorly GI: Soft, nontender, non-distended  MS: No edema; right BKA noted Neuro:  Nonfocal  Psych: Normal affect   Labs    High Sensitivity Troponin:   Recent Labs  Lab 08/12/20 0946 08/12/20 1145 08/12/20 2149  TROPONINIHS 69* 82* 182*      Chemistry Recent Labs  Lab 08/12/20 0946 08/13/20 0510 08/14/20 0540 08/15/20 0454  NA 136 137 134* 134*  K 5.6* 4.5 4.1 4.0  CL 92* 94* 91* 93*  CO2 19* 29 28 26   GLUCOSE 55* 72 114* 101*  BUN 45* 38* 31* 41*  CREATININE 7.47* 5.76* 4.87* 6.26*  CALCIUM 10.5* 9.3 8.8* 8.5*  PROT 7.5  --   --  7.0  ALBUMIN 3.1*  --   --  2.9*  AST 105*  --   --  235*  ALT 96*  --   --  291*  ALKPHOS 179*  --   --  135*  BILITOT 2.0*  --   --  1.4*  GFRNONAA 8* 10* 13* 9*  ANIONGAP 25* 14 15 15      Hematology Recent Labs  Lab 08/12/20 0946 08/13/20 0510 08/15/20 0454  WBC 13.4* 6.7 5.7  RBC 3.90* 3.80* 3.51*  HGB 12.3* 11.8* 11.0*  HCT 42.2 38.9* 36.0*  MCV 108.2* 102.4* 102.6*  MCH 31.5 31.1 31.3  MCHC 29.1* 30.3 30.6  RDW 18.0* 17.6* 17.3*  PLT 106* 91* 89*    BNPNo results for input(s): BNP, PROBNP in the last 168 hours.   DDimer No results for input(s): DDIMER in the last 168 hours.   Radiology    ECHOCARDIOGRAM COMPLETE  Result Date: 08/14/2020    ECHOCARDIOGRAM REPORT   Patient Name:   Jeremy [name] Date of Exam: 08/14/2020 Medical Rec #:  [id number]   Height:       70.0 in Accession #:    [id number]  Weight:       156.7 lb Date of Birth:   February 22, 1956   BSA:          1.882 m Patient Age:    65 years    BP:           89/67 mmHg Patient Gender: M           HR:           114 bpm. Exam Location:  [facility] Procedure: 2D Echo, Cardiac Doppler and Color Doppler Indications:     Ventriculat Tachycardia I47.2  History:         Patient has prior history of Echocardiogram examinations, most                  recent 10/01/2019. Previous Myocardial Infarction. ESRD, PAF.  Sonographer:     Sherrie Sport RDCS (AE) Referring Phys:  [place] Phys: Kate Sable MD IMPRESSIONS  1. Left ventricular ejection fraction, by estimation, is 25 to 30%. The left ventricle has severely decreased function. The left ventricle demonstrates global hypokinesis. There is mild left ventricular hypertrophy. Left ventricular diastolic parameters  are indeterminate.  2. Right ventricular systolic function is severely reduced. The right ventricular size is moderately enlarged.  3. Left atrial size was severely dilated.  4. The mitral valve is degenerative. Mild mitral valve regurgitation. Moderate mitral annular calcification.  5. The aortic valve has been repaired/replaced. Aortic valve regurgitation is not visualized.  6. The inferior vena cava is normal in size with <50% respiratory variability, suggesting right atrial pressure of 8 mmHg. FINDINGS  Left Ventricle: Left ventricular ejection fraction, by estimation, is 25 to 30%. The left ventricle has severely decreased function. The left ventricle demonstrates global hypokinesis. The left ventricular internal cavity size was normal in size. There is mild left ventricular hypertrophy. Left ventricular diastolic parameters are indeterminate. Right Ventricle: The right ventricular size is moderately enlarged. No increase in right ventricular wall thickness. Right ventricular systolic function is severely reduced. Left Atrium: Left atrial size was severely dilated. Right Atrium: Right atrial size was normal in size.  Pericardium: There is no evidence of pericardial effusion. Mitral Valve: The mitral valve is degenerative in appearance. Moderate mitral annular calcification. Mild mitral valve regurgitation. Tricuspid Valve: The tricuspid valve is normal in structure. Tricuspid valve regurgitation is mild. Aortic Valve: The  aortic valve has been repaired/replaced. Aortic valve regurgitation is not visualized. Aortic valve mean gradient measures 5.0 mmHg. Aortic valve peak gradient measures 8.4 mmHg. Aortic valve area, by VTI measures 1.97 cm. There is a mechanical valve present in the aortic position. Pulmonic Valve: The pulmonic valve was grossly normal. Pulmonic valve regurgitation is not visualized. Aorta: The aortic root is normal in size and structure. Venous: The inferior vena cava is normal in size with less than 50% respiratory variability, suggesting right atrial pressure of 8 mmHg. IAS/Shunts: No atrial level shunt detected by color flow Doppler.  LEFT VENTRICLE PLAX 2D LVIDd:         5.57 cm LVIDs:         4.52 cm LV PW:         1.35 cm LV IVS:        1.01 cm LVOT diam:     2.00 cm LV SV:         36 LV SV Index:   19 LVOT Area:     3.14 cm  RIGHT VENTRICLE RV Basal diam:  6.33 cm RV S prime:     4.46 cm/s TAPSE (M-mode): 3.4 cm LEFT ATRIUM              Index       RIGHT ATRIUM           Index LA diam:        5.30 cm  2.82 cm/m  RA Area:     35.50 cm LA Vol (A2C):   178.0 ml 94.58 ml/m RA Volume:   153.00 ml 81.30 ml/m LA Vol (A4C):   163.0 ml 86.61 ml/m LA Biplane Vol: 171.0 ml 90.86 ml/m  AORTIC VALVE                    PULMONIC VALVE AV Area (Vmax):    1.47 cm     PV Vmax:        0.45 m/s AV Area (Vmean):   1.48 cm     PV Peak grad:   0.8 mmHg AV Area (VTI):     1.97 cm     RVOT Peak grad: 2 mmHg AV Vmax:           144.67 cm/s AV Vmean:          105.667 cm/s AV VTI:            0.185 m AV Peak Grad:      8.4 mmHg AV Mean Grad:      5.0 mmHg LVOT Vmax:         67.90 cm/s LVOT Vmean:        49.800 cm/s LVOT  VTI:          0.116 m LVOT/AV VTI ratio: 0.63  AORTA Ao Root diam: 3.20 cm MITRAL VALVE                TRICUSPID VALVE MV Area (PHT): 3.87 cm     TR Peak grad:   31.8 mmHg MV Decel Time: 196 msec     TR Vmax:        282.00 cm/s MV E velocity: 129.00 cm/s                             SHUNTS  Systemic VTI:  0.12 m                             Systemic Diam: 2.00 cm Kate Sable MD Electronically signed by Kate Sable MD Signature Date/Time: 08/14/2020/12:10:33 PM    Final     Cardiac Studies   Echo 08/14/2020 1. Left ventricular ejection fraction, by estimation, is 25 to 30%. The  left ventricle has severely decreased function. The left ventricle  demonstrates global hypokinesis. There is mild left ventricular  hypertrophy. Left ventricular diastolic parameters  are indeterminate.  2. Right ventricular systolic function is severely reduced. The right  ventricular size is moderately enlarged.  3. Left atrial size was severely dilated.  4. The mitral valve is degenerative. Mild mitral valve regurgitation.  Moderate mitral annular calcification.  5. The aortic valve has been repaired/replaced. Aortic valve  regurgitation is not visualized.  6. The inferior vena cava is normal in size with <50% respiratory  variability, suggesting right atrial pressure of 8 mmHg.   Patient Profile     65 y.o. male with history of CAD/CABG, PCI to RPDA, persistent A. fib/flutter, severe AS s/p SAVR mechanical aortic valve, presenting with chest pain, orthopnea, wide-complex tachycardia.  Echocardiogram showing severely reduced EF.  Assessment & Plan    1.  Atrial flutter, SVT, WCT -s/p successful DC cardioversion today- sinus rhythm with PAC's -Continue amiodarone gtt today. If stays in sinus, Anticipate starting po amio tomorrow -Continue warfarin.  INR therapeutic  2. cardiomyopathy, EF 25-30% (ischemic vs tachy induced?) -stop lopressor, start toprol xl 25mg   qd -low BP preventing addition of ACE/ARB/ARNI  3.  CAD/CABG, PCI to RPDA -Plavix, Crestor, warfarin  4.  AS s/p SAVR -Warfarin  5.  End-stage renal disease -HD as per nephrology  Total encounter time 35 minutes  Greater than 50% was spent in counseling and coordination of care with the patient       Signed, Kate Sable, MD  08/15/2020, 8:30 AM

## 2020-08-15 NOTE — Progress Notes (Signed)
PT Cancellation Note  Patient Details Name: Jeremy [name] MRN: [id number] DOB: 1955-07-07   Cancelled Treatment:    Reason Eval/Treat Not Completed: Patient at procedure or test/unavailable; Pt at HD, will attempt to see pt at a future date/time as medically appropriate.     Linus Salmons PT, DPT 08/15/20, 1:33 PM

## 2020-08-15 NOTE — Transfer of Care (Signed)
Immediate Anesthesia Transfer of Care Note  Patient: Jeremy [name]  Procedure(s) Performed: CARDIOVERSION (N/A )  Patient Location: PACU and Special Procedures Recovery  Anesthesia Type:General  Level of Consciousness: drowsy  Airway & Oxygen Therapy: Patient Spontanous Breathing and Patient connected to nasal cannula oxygen  Post-op Assessment: Report given to RN and Post -op Vital signs reviewed and stable  Post vital signs: Reviewed and stable  Last Vitals:  Vitals Value Taken Time  BP 80/54 08/15/20 0836  Temp    Pulse 53 08/15/20 0838  Resp 22 08/15/20 0838  SpO2 100 % 08/15/20 0838  Vitals shown include unvalidated device data.  Last Pain:  Vitals:   08/15/20 0801  TempSrc: Oral  PainSc: 0-No pain         Complications: No complications documented.

## 2020-08-16 ENCOUNTER — Telehealth: Payer: Self-pay | Admitting: Cardiovascular Disease

## 2020-08-16 ENCOUNTER — Other Ambulatory Visit: Payer: Self-pay | Admitting: Cardiovascular Disease

## 2020-08-16 DIAGNOSIS — E875 Hyperkalemia: Secondary | ICD-10-CM

## 2020-08-16 DIAGNOSIS — T8610 Unspecified complication of kidney transplant: Secondary | ICD-10-CM | POA: Diagnosis not present

## 2020-08-16 DIAGNOSIS — N186 End stage renal disease: Secondary | ICD-10-CM | POA: Diagnosis not present

## 2020-08-16 DIAGNOSIS — I471 Supraventricular tachycardia: Secondary | ICD-10-CM

## 2020-08-16 DIAGNOSIS — Z992 Dependence on renal dialysis: Secondary | ICD-10-CM | POA: Diagnosis not present

## 2020-08-16 LAB — COMPREHENSIVE METABOLIC PANEL
ALT: 217 U/L — ABNORMAL HIGH (ref 0–44)
AST: 138 U/L — ABNORMAL HIGH (ref 15–41)
Albumin: 2.8 g/dL — ABNORMAL LOW (ref 3.5–5.0)
Alkaline Phosphatase: 133 U/L — ABNORMAL HIGH (ref 38–126)
Anion gap: 16 — ABNORMAL HIGH (ref 5–15)
BUN: 30 mg/dL — ABNORMAL HIGH (ref 8–23)
CO2: 25 mmol/L (ref 22–32)
Calcium: 8.6 mg/dL — ABNORMAL LOW (ref 8.9–10.3)
Chloride: 95 mmol/L — ABNORMAL LOW (ref 98–111)
Creatinine, Ser: 5.28 mg/dL — ABNORMAL HIGH (ref 0.61–1.24)
GFR, Estimated: 11 mL/min — ABNORMAL LOW (ref >60)
Glucose, Bld: 94 mg/dL (ref 70–99)
Potassium: 4.1 mmol/L (ref 3.5–5.1)
Sodium: 136 mmol/L (ref 135–145)
Total Bilirubin: 1.5 mg/dL — ABNORMAL HIGH (ref 0.3–1.2)
Total Protein: 6.9 g/dL (ref 6.5–8.1)

## 2020-08-16 LAB — CBC
HCT: 35.2 % — ABNORMAL LOW (ref 39.0–52.0)
Hemoglobin: 10.9 g/dL — ABNORMAL LOW (ref 13.0–17.0)
MCH: 31.5 pg (ref 26.0–34.0)
MCHC: 31 g/dL (ref 30.0–36.0)
MCV: 101.7 fL — ABNORMAL HIGH (ref 80.0–100.0)
Platelets: 79 10*3/uL — ABNORMAL LOW (ref 150–400)
RBC: 3.46 MIL/uL — ABNORMAL LOW (ref 4.22–5.81)
RDW: 17.3 % — ABNORMAL HIGH (ref 11.5–15.5)
WBC: 5.6 10*3/uL (ref 4.0–10.5)
nRBC: 0 % (ref 0.0–0.2)

## 2020-08-16 LAB — PROTIME-INR
INR: 4.4 (ref 0.8–1.2)
Prothrombin Time: 41.7 seconds — ABNORMAL HIGH (ref 11.4–15.2)

## 2020-08-16 LAB — SEDIMENTATION RATE: Sed Rate: 24 mm/hr — ABNORMAL HIGH (ref 0–20)

## 2020-08-16 LAB — HEPATITIS C ANTIBODY: HCV Ab: NONREACTIVE

## 2020-08-16 MED ORDER — AMIODARONE HCL 200 MG PO TABS: ORAL_TABLET | ORAL | 0 refills | Status: AC

## 2020-08-16 MED ORDER — ALBUTEROL SULFATE HFA 108 (90 BASE) MCG/ACT IN AERS: 2.0000 | INHALATION_SPRAY | RESPIRATORY_TRACT | 0 refills | Status: AC | PRN

## 2020-08-16 MED ORDER — METOPROLOL SUCCINATE ER 25 MG PO TB24
25.0000 mg | ORAL_TABLET | Freq: Every day | ORAL | 0 refills | Status: AC
Start: 2020-08-17 — End: ?

## 2020-08-16 MED ORDER — WARFARIN SODIUM 1 MG PO TABS: 1.0000 mg | ORAL_TABLET | ORAL | 0 refills | Status: AC

## 2020-08-16 MED ORDER — MIDODRINE HCL 10 MG PO TABS
10.0000 mg | ORAL_TABLET | Freq: Three times a day (TID) | ORAL | 0 refills | Status: AC
Start: 2020-08-16 — End: ?

## 2020-08-16 NOTE — Discharge Instructions (Addendum)
Hold coumadin for 2 days then start warfarin 1mg  every other day after that

## 2020-08-16 NOTE — Progress Notes (Addendum)
Mobility Specialist - Progress Note   08/16/20 1100  Mobility  Activity Transferred to/from [facility]  Level of Assistance Minimal assist, patient does 75% or more  Assistive Device BSC;Wheelchair  Distance Ambulated (ft) 0 ft  Mobility Out of bed for toileting  Mobility Response Tolerated well  Mobility performed by Mobility specialist  $Mobility charge 1 Mobility    Pt SPT from [facility] to WC with minA and use of rails.    Kathee Delton Mobility Specialist 08/16/20, 11:38 AM

## 2020-08-16 NOTE — Consult Note (Signed)
[place] for warfarin Indication: s/p of AVR with mechanical valve  Allergies  Allergen Reactions  . Statins Other (See Comments)    Arthralgias (intolerance), Myalgias (intolerance)  Pt taking pravastatin   . Gabapentin     Drowsy   . Sertraline Rash    Patient Measurements: Height: 5\' 10"  (177.8 cm) Weight: 71 kg (156 lb 9.6 oz) IBW/kg (Calculated) : 73   Vital Signs: Temp: 98.4 F (36.9 C) (05/12 0321) BP: 125/87 (05/12 0321) Pulse Rate: 54 (05/12 0321)  Labs: Recent Labs    08/14/20 0540 08/15/20 0454 08/15/20 1505 08/16/20 0425  HGB  --  11.0*  --  10.9*  HCT  --  36.0*  --  35.2*  PLT  --  89*  --  79*  LABPROT 33.4*  --  39.9* 41.7*  INR 3.3*  --  4.1* 4.4*  CREATININE 4.87* 6.26*  --  5.28*    Estimated Creatinine Clearance: 14.4 mL/min (A) (by C-G formula based on SCr of 5.28 mg/dL (H)).   Medical History: Past Medical History:  Diagnosis Date  . Abnormal stress test 03/19/2018  . 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 Anxiety    situation  . Aortic stenosis    a. 2014 s/p mech AVR ([facility])-->chronic coumadin; b. 08/2018 Echo: Mild AS/AI. Nl fxn/gradient; c. 09/2019 Echo: EF 45-50%, AoV mean grad 41mmHg.  . Arthritis   . CAD (coronary artery disease)    a. 2013 PCI: LAD 67m (2.75x28 Xience DES); b. 06/2018 Cath: LM min irregs, LAD 60p, 95/30/69m, LCX min irregs, RCA 40p, 52m, 90d, RPAV 90, RPDA 95ost; d. 09/2018 CABGx3 (LIMA->LAD, VG->D1, VG->PDA); e. 07/2019 PCI: LAD 90p, 54m, 20m ISR, 52m, D2 fills via L->L collats, LCX mild diff dzs, RCA 40p, 78m, 99d, RPAV 99, RPDA 95ost, VG->D2 100, LIMA->LAD ok, VG->RPDA 99 (4.5x15 Resolute Onyx DES).  . Chronic anticoagulation 06/2012   a. Coumadin in the setting of mech AVR and afib.  . Complication of anesthesia   . COVID-19 03/15/2019  . CPAP (continuous positive airway pressure) dependence   . Dialysis patient [facility]) 04/07/2006   Patient started HD around 1996,  had  1st transplant LLQ around 1998 until 2007 when they found renal cell cancer in transplant and both native kidneys, all were removed > went back on HD until 2nd transplant RLQ in 2014 which lasted 3 yrs; it was embolized for suspected acute rejection. Is currently on HD MWF in [place] w/ [facility] nephrology.  . ESRD (end stage renal disease) ([place])    a. 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 1999 s/p Kidney transplant; c. 2014 Bilat nephrectomy (transplanted kidney resected) in the setting of renal cell carcinoma; d. PD until 11/2017-->HD.  Marland Kitchen Heart murmur   . HFrEF (heart failure with reduced ejection fraction) ([place])    a. 07/2016 Echo: EF 55-60%; b. 02/2018 MV: EF 30%; c. 04/2018 Echo: EF 35%; d. 08/2018 Echo: EF 30-35%. Glob HK w/o rwma; e. 09/2019 Echo: EF 45-50%, mod LVH, basal-mid inf/inflat HK, nl RV fxn, mod dil LA, mildly dil RA. Nl AoV prosthesis (mean grad 53mmHg).  . History of blood transfusion   . Hx of colonoscopy    2009 by Dr. Laural Golden. Normal except for small submucosal lipoma. No evidence of polyps or colitis.  . Hyperlipidemia   . Hypertension   . Hypogonadism in male 07/25/2015  . Ischemic cardiomyopathy    a. 07/2016 Echo: EF 55-60%; b. 02/2018  MV: EF 30%; c. 04/2018 Echo: EF 35%; d. 08/2018 Echo: EF 30-35%; e.  09/2019 Echo: EF 45-50%, mod LVH, basal-mid inf/inflat HK.  . Myocardial infarction ([place])   . OSA (obstructive sleep apnea)    No CPAP  . Paroxysmal atrial flutter ([place])   . Peripheral vascular disease ([facility] [place])    a. 09/2019 s/p L SFA and L profunda stenting; b. 12/2019 s/p R PT and TP trunk PTA; c. 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  Marland Kitchen Permanent atrial fibrillation ([facility])    a. CHA2DS2VASc = 3-->Coumadin (also mech AVR); b. 06/2018 Amio started 2/2 elevated rates.  Marland Kitchen PONV (postoperative nausea and vomiting)   . Renal cell carcinoma ([place]) 08/04/2016    Medications:  PTA: Warfarin 1 mg daily, Friday dose may change depending on reading per chart review.   Started on amiodarone 400 mg BID.    Assessment: 65 yo M presenting with chest pain and shortness of breath. 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 Pharmacy has been consulted to dose and monitor warfarin. Started on amiodarone 5/10.  pt volume overloaded with likely hepatic congestion & slightly deceased albumin.    Goal of Therapy:  INR 2.5-3.5  (per chart review) Monitor platelets by anticoagulation protocol: Yes   Date INR Dose 5/8 4.2  Hold 5/9 4.5 Hold 5/10 3.3 0.5mg  given 5/11 4.1 HOLD 5/12 4.4 HOLD  Plan:  INR supratherapeutic and trending up. Will hold dose for today. Daily INR ordered. CBC at least every 3 days. Pt started on amio, recommend cutting home warfarin dose by 50% prior to discharge or Warfarin 1 mg MWF.   Oswald Hillock, PharmD, BCPS 08/16/2020 8:20 AM

## 2020-08-16 NOTE — Telephone Encounter (Signed)
Patient will call back with work schedule

## 2020-08-16 NOTE — Telephone Encounter (Signed)
-----   Message from Minna Merritts, MD sent at 08/16/2020 11:28 AM EDT ----- Leaving the hospital today, needs follow-up with Dr. [name] or APP 1 to 2 weeks Thx TG

## 2020-08-16 NOTE — Progress Notes (Signed)
[facility]  ROUNDING NOTE   Subjective:   [name] was admitted to [facility] on 08/12/2020 for Hyperkalemia [E87.5] Wide-complex tachycardia ([place]) [I47.2] Fluid overload [E87.70]  Last hemodialysis treatment was 5/6. Patient completed his dialysis treatment. Patient was at his estimated dry weight at the end of treatment, 70.5 Kg  Patient was getting more short of breath since yesterday. He has been more weak. He denies any changes to his diet but states he is constipated.   Patient with V tach on the field. Was given IV amiodarone and 1 g of calcium. He was then given 2 g of calcium, insulin and dextrose when brought to the ED. Follow up K of 5.6.   Patient seen working with PT and sitting in wheelchair Alert and oriented Tolerating meals Denies shortness of breath    Objective:  Vital signs in last 24 hours:  Temp:  [98.2 F (36.8 C)-98.8 F (37.1 C)] 98.2 F (36.8 C) (05/12 0825) Pulse Rate:  [46-92] 63 (05/12 0829) Resp:  [9-26] 18 (05/12 0321) BP: (96-131)/(59-87) 117/85 (05/12 0825) SpO2:  [98 %-100 %] 98 % (05/12 0825) Weight:  [71 kg] 71 kg (05/12 0425)  Weight change: 0.033 kg Filed Weights   08/14/20 0529 08/15/20 0520 08/16/20 0425  Weight: 71.1 kg 71 kg 71 kg    Intake/Output: I/O last 3 completed shifts: In: 50 [I.V.:50] Out: 85 [Other:85]   Intake/Output this shift:  Total I/O In: 480 [P.O.:480] Out: -   Physical Exam: General: NAD, sitting in wheelchair  Head: Normocephalic, atraumatic. Moist oral mucosal membranes  Eyes: Anicteric  Lungs:  Diminished bilaterally  Heart: Irregular, +murmur  Abdomen:  Soft, nontender,   Extremities:  no edema, Right BKA with clean dressings.   Neurologic: Nonfocal, moving all four extremities  Skin: No lesions  Access: Left AVF    Basic Metabolic Panel: Recent Labs  Lab 08/12/20 0946 08/13/20 0510 08/14/20 0540 08/15/20 0454 08/16/20 0425  NA 136 137 134* 134* 136  K 5.6* 4.5 4.1 4.0  4.1  CL 92* 94* 91* 93* 95*  CO2 19* '29 28 26 25  ' GLUCOSE 55* 72 114* 101* 94  BUN 45* 38* 31* 41* 30*  CREATININE 7.47* 5.76* 4.87* 6.26* 5.28*  CALCIUM 10.5* 9.3 8.8* 8.5* 8.6*    Liver Function Tests: Recent Labs  Lab 08/12/20 0946 08/15/20 0454 08/16/20 0425  AST 105* 235* 138*  ALT 96* 291* 217*  ALKPHOS 179* 135* 133*  BILITOT 2.0* 1.4* 1.5*  PROT 7.5 7.0 6.9  ALBUMIN 3.1* 2.9* 2.8*   No results for input(s): LIPASE, AMYLASE in the last 168 hours. No results for input(s): AMMONIA in the last 168 hours.  CBC: Recent Labs  Lab 08/12/20 0946 08/13/20 0510 08/15/20 0454 08/16/20 0425  WBC 13.4* 6.7 5.7 5.6  NEUTROABS 5.9  --   --   --   HGB 12.3* 11.8* 11.0* 10.9*  HCT 42.2 38.9* 36.0* 35.2*  MCV 108.2* 102.4* 102.6* 101.7*  PLT 106* 91* 89* 79*    Cardiac Enzymes: No results for input(s): CKTOTAL, CKMB, CKMBINDEX, TROPONINI in the last 168 hours.  BNP: Invalid input(s): POCBNP  CBG: Recent Labs  Lab 08/12/20 1244 08/12/20 1300 08/12/20 1627 08/12/20 1915  GLUCAP 23* 143* 109* 102*    Microbiology: Results for orders placed or performed during the hospital encounter of 08/12/20  Resp Panel by RT-PCR (Flu A&B, Covid) Nasopharyngeal Swab     Status: None   Collection Time: 08/12/20  9:46  AM   Specimen: Nasopharyngeal Swab; Nasopharyngeal(NP) swabs in vial transport medium  Result Value Ref Range Status   SARS Coronavirus 2 by RT PCR NEGATIVE NEGATIVE Final    Comment: (NOTE) SARS-CoV-2 target nucleic acids are NOT DETECTED.  The SARS-CoV-2 RNA is generally detectable in upper respiratory specimens during the acute phase of infection. The lowest concentration of SARS-CoV-2 viral copies this assay can detect is 138 copies/mL. A negative result does not preclude SARS-Cov-2 infection and should not be used as the sole basis for treatment or other patient management decisions. A negative result may occur with  improper specimen collection/handling,  submission of specimen other than nasopharyngeal swab, presence of viral mutation(s) within the areas targeted by this assay, and inadequate number of viral copies(<138 copies/mL). A negative result must be combined with clinical observations, patient history, and epidemiological information. The expected result is Negative.  Fact Sheet for Patients:  EntrepreneurPulse.com.au  Fact Sheet for Healthcare Providers:  IncredibleEmployment.be  This test is no t yet approved or cleared by the [place] FDA and  has been authorized for detection and/or diagnosis of SARS-CoV-2 by FDA under an Emergency Use Authorization (EUA). This EUA will remain  in effect (meaning this test can be used) for the duration of the COVID-19 declaration under Section 564(b)(1) of the Act, 21 U.S.C.section 360bbb-3(b)(1), unless the authorization is terminated  or revoked sooner.       Influenza A by PCR NEGATIVE NEGATIVE Final   Influenza B by PCR NEGATIVE NEGATIVE Final    Comment: (NOTE) The Xpert Xpress SARS-CoV-2/FLU/RSV plus assay is intended as an aid in the diagnosis of influenza from Nasopharyngeal swab specimens and should not be used as a sole basis for treatment. Nasal washings and aspirates are unacceptable for Xpert Xpress SARS-CoV-2/FLU/RSV testing.  Fact Sheet for Patients: EntrepreneurPulse.com.au  Fact Sheet for Healthcare Providers: IncredibleEmployment.be  This test is not yet approved or cleared by the [place] FDA and has been authorized for detection and/or diagnosis of SARS-CoV-2 by FDA under an Emergency Use Authorization (EUA). This EUA will remain in effect (meaning this test can be used) for the duration of the COVID-19 declaration under Section 564(b)(1) of the Act, 21 U.S.C. section 360bbb-3(b)(1), unless the authorization is terminated or revoked.  Performed at [facility], [address] [place], [place] [address]   Culture, blood (Routine X 2) w Reflex to ID Panel     Status: None (Preliminary result)   Collection Time: 08/12/20  9:49 PM   Specimen: BLOOD  Result Value Ref Range Status   Specimen Description BLOOD BLOOD RIGHT FOREARM  Final   Special Requests   Final    BOTTLES DRAWN AEROBIC AND ANAEROBIC Blood Culture adequate volume   Culture   Final    NO GROWTH 4 DAYS Performed at [facility], [address], [place], [place] [address]    Report Status PENDING  Incomplete  Culture, blood (Routine X 2) w Reflex to ID Panel     Status: None (Preliminary result)   Collection Time: 08/12/20  9:50 PM   Specimen: BLOOD  Result Value Ref Range Status   Specimen Description BLOOD  RIGHT HAND  Final   Special Requests   Final    BOTTLES DRAWN AEROBIC AND ANAEROBIC Blood Culture adequate volume   Culture   Final    NO GROWTH 4 DAYS Performed at [facility], [address] [place],  [address]    Report Status PENDING  Incomplete   *Note: Due to a large number of results and/or encounters for the requested time period, some results have not been displayed. A complete set of results can be found in Results Review.    Coagulation Studies: Recent Labs    08/14/20 0540 08/15/20 1505 08/16/20 0425  LABPROT 33.4* 39.9* 41.7*  INR 3.3* 4.1* 4.4*    Urinalysis: No results for input(s): COLORURINE, LABSPEC, PHURINE, GLUCOSEU, HGBUR, BILIRUBINUR, KETONESUR, PROTEINUR, UROBILINOGEN, NITRITE, LEUKOCYTESUR in the last 72 hours.  Invalid input(s): APPERANCEUR    Imaging: No results found.   Medications:   . sodium chloride 250 mL (08/15/20 0820)   . allopurinol  100 mg Oral Daily  . amiodarone  400 mg Oral BID  . cinacalcet  30 mg Oral Q supper  . clopidogrel  75 mg Oral Q breakfast  . ezetimibe  10 mg Oral Daily  . hydroxychloroquine  400 mg Oral QHS  . loratadine  10 mg Oral Daily  . metoprolol succinate  25 mg Oral  Daily  . midodrine  10 mg Oral TID WC  . multivitamin  1 tablet Oral Daily  . pantoprazole  40 mg Oral Daily  . rosuvastatin  40 mg Oral q1800  . sevelamer carbonate  3,200 mg Oral TID WC  . sodium chloride flush  3 mL Intravenous Q12H  . Warfarin - Pharmacist Dosing Inpatient   Does not apply q1600   sodium chloride, albuterol, dextromethorphan-guaiFENesin, dextrose, hydrALAZINE, hydrOXYzine, lidocaine-prilocaine, loperamide, nitroGLYCERIN, oxyCODONE-acetaminophen, phenylephrine, polyethylene glycol, simethicone, sodium chloride flush, traZODone  Assessment/ Plan:  [name] is a 65 y.o. 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 End Stage Renal Disease with fluid overload and hyperkalemia - Receiving dialysis yesterday - UF goal not met due to hypotension - Tolerated well - Next treatment scheduled for Friday - Cleared for discharge from renal stance, will continue treatments at outpatient clinic   2. Hypotension in setting of A-flutter with RVR:  - Midodrine TID  - started po Amiodarone  - Successful Cardioversion 08/15/20  3. Anemia of chronic kidney disease: hemoglobin 10.9, Macrocytic. - Retacrit as outpatient.  - Acceptable range  4. Secondary Hyperparathyroidism:  - sevelamer with meals - cinacalcet.    LOS: 4 Julen Rubert 5/12/202212:22 PM

## 2020-08-16 NOTE — TOC Transition Note (Signed)
Transition of Care [facility]) - CM/SW Discharge Note   Patient Details  Name: Jeremy [name] MRN: [id number] Date of Birth: [birth year]  [facility] CM/SW Contact:  Alberteen Sam, LCSW Phone Number: 08/16/2020, 11:55 AM   Clinical Narrative:     Met with patient at bedside for heart failure home health screen. Patient reports currently being active with [facility] for PT services, would like to resume at discharge. Corene Cornea with [facility] has been informed of patient's discharge today.   Patient reports no problems getting medication or having transportation to appointments, reports his girlfriend is supportive and will be picking him up today at discharge. Reports no noted equipment needs at home.   Patient reports no further questions or concerns, is prepared to discharge today.   Final next level of care: [place] Barriers to Discharge: No Barriers Identified   Patient Goals and CMS Choice Patient states their goals for this hospitalization and ongoing recovery are:: to go home CMS Medicare.gov Compare Post Acute Care list provided to:: Patient Choice offered to / list presented to : Patient  Discharge Placement                  Name of family member notified: girlfriend to pick    Discharge Plan and Services                          HH Arranged: PT [facility] Agency: [place] ([facility]) Date HH Agency Contacted: 08/16/20 Time [facility] [place]: [place] Representative spoke with at [place]: [place] ([place]) Interventions     Readmission Risk Interventions Readmission Risk Prevention Plan 04/23/2020 10/06/2019 10/06/2019  Transportation Screening Complete - Complete  PCP or Specialist Appt within 3-5 Days Not Complete - -  Not Complete comments INPT rehab - -  [place] or [place] Complete Complete Complete  Social Work Consult for [place] Planning/Counseling Complete - -  Palliative  Care Screening Not Applicable Not Applicable Not Applicable  Medication Review (RN Care Manager) Complete Complete Complete  PCP or Specialist appointment within 3-5 days of discharge - - -  [place] recent data might be hidden

## 2020-08-16 NOTE — Evaluation (Signed)
Physical Therapy Evaluation Patient Details Name: Jeremy [name] MRN: [id number] DOB: Jan 19, 1956 Today's Date: 08/16/2020   History of Present Illness  65 y.o. male here with shortness of breath.  RVR and now s/p cardioversion 5/11. 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  He was, however, able to do multiple pivot transfers to/from w/c and reports feeling relatively close to his baseline.  He reports he is currently working with [place] and does desire to eventually get a prosthesis and begin to do some actual walking.  Pt is safe to go home, does need continued HHPT.    Follow Up Recommendations Home health PT    Equipment Recommendations  None recommended by PT    Recommendations for Other Services       Precautions / Restrictions Precautions Precautions: Fall Restrictions Weight Bearing Restrictions: No      Mobility  Bed Mobility Overal bed mobility: Modified Independent             General bed mobility comments: Pt able to transition to sitting w/o assist, heavy UE use but no assist needed    Transfers Overall transfer level: Needs assistance   Transfers: Stand Pivot Transfers   Stand pivot transfers: Supervision       General transfer comment: Pt able to transfer multiple times with 90* and near 180* turns to similar height surfaces (bed to w/c, w/c to recliner)  Ambulation/Gait             General Gait Details: Pt able to self propel w/c >50 ft safely and with good confidence.  did have some fatigue, though vitals remained stable  Stairs            Wheelchair Mobility    Modified Rankin (Stroke Patients Only)       Balance Overall balance  assessment: Modified Independent                                           Pertinent Vitals/Pain      Home Living Family/patient expects to be discharged to:: Private residence Living Arrangements: Spouse/significant other Available Help at Discharge: Available PRN/intermittently;Other (Comment);Family Type of Home: House Home Access: Stairs to enter;Ramped entrance     Home Layout: Able to live on main level with bedroom/bathroom;Two level;Laundry or work area in [place]: Environmental consultant - 2 wheels;Walker - 4 wheels;Cane - single point;Electric scooter;Wheelchair - manual      Prior Function Level of Independence: Needs assistance         Comments: Pt uses scooter in the home and transfers independently, girlfried assist with some ADLs, most notedly bathing     Hand Dominance        Extremity/Trunk Assessment   Upper Extremity Assessment Upper Extremity Assessment: Generalized weakness (Pt fatigues quickly in UEs, tricps grossly 3+/5)    Lower Extremity Assessment Lower Extremity Assessment: Generalized weakness (R BKA)       Communication   Communication: No difficulties  Cognition Arousal/Alertness: Awake/alert Behavior During Therapy: WFL for tasks assessed/performed Overall Cognitive Status: Within Functional Limits for tasks assessed  General Comments General comments (skin integrity, edema, etc.): discussed that he is nearing the time to transition to prosthetic and eventual gait training if this is, indeed, a goal    Exercises     Assessment/Plan    PT Assessment Patient needs continued PT services  PT Problem List Decreased strength;Decreased range of motion;Decreased activity tolerance;Decreased balance;Decreased mobility;Decreased knowledge of use of DME;Decreased safety awareness       PT Treatment Interventions DME instruction;Functional mobility  training;Therapeutic activities;Therapeutic exercise;Balance training    PT Goals (Current goals can be found in the Care Plan section)  Acute Rehab PT Goals Patient Stated Goal: go home PT Goal Formulation: With patient Time For Goal Achievement: 08/30/20 Potential to Achieve Goals: Good    Frequency Min 2X/week   Barriers to discharge        Co-evaluation               AM-PAC PT "6 Clicks" Mobility  Outcome Measure Help needed turning from your back to your side while in a flat bed without using bedrails?: None Help needed moving from lying on your back to sitting on the side of a flat bed without using bedrails?: None Help needed moving to and from a bed to a chair (including a wheelchair)?: A Little Help needed standing up from a chair using your arms (e.g., wheelchair or bedside chair)?: A Little Help needed to walk in hospital room?: A Lot Help needed climbing 3-5 steps with a railing? : Total 6 Click Score: 17    End of Session Equipment Utilized During Treatment: Gait belt Activity Tolerance: Patient limited by fatigue;Patient tolerated treatment well Patient left: with call bell/phone within reach;with chair alarm set Nurse Communication: Mobility status PT Visit Diagnosis: Muscle weakness (generalized) (M62.81);Difficulty in walking, not elsewhere classified (R26.2)    Time: 8937-3428 PT Time Calculation (min) (ACUTE ONLY): 28 min   Charges:   PT Evaluation $PT Eval Low Complexity: 1 Low PT Treatments $Therapeutic Activity: 8-22 mins        Kreg Shropshire, DPT 08/16/2020, 10:45 AM

## 2020-08-16 NOTE — Progress Notes (Signed)
Progress Note  Patient Name: Jeremy [name] Date of Encounter: 08/16/2020  [facility] Cardiologist: Kathlyn Sacramento, MD   Subjective   Reports feeling well, sitting up eating breakfast, no complaints, denies any abdominal fullness shortness of breath Had dialysis yesterday, breathing improved Sleeping with CPAP In general reports poor sleep hygiene sleeps for 1 hour at a time only, chronic issue  Nephrology to continue midodrine  Inpatient Medications    Scheduled Meds: . allopurinol  100 mg Oral Daily  . amiodarone  400 mg Oral BID  . cinacalcet  30 mg Oral Q supper  . clopidogrel  75 mg Oral Q breakfast  . ezetimibe  10 mg Oral Daily  . hydroxychloroquine  400 mg Oral QHS  . loratadine  10 mg Oral Daily  . metoprolol succinate  25 mg Oral Daily  . midodrine  10 mg Oral TID WC  . multivitamin  1 tablet Oral Daily  . pantoprazole  40 mg Oral Daily  . rosuvastatin  40 mg Oral q1800  . sevelamer carbonate  3,200 mg Oral TID WC  . sodium chloride flush  3 mL Intravenous Q12H  . Warfarin - Pharmacist Dosing Inpatient   Does not apply q1600   Continuous Infusions: . sodium chloride 250 mL (08/15/20 0820)   PRN Meds: sodium chloride, albuterol, dextromethorphan-guaiFENesin, dextrose, hydrALAZINE, hydrOXYzine, lidocaine-prilocaine, loperamide, nitroGLYCERIN, oxyCODONE-acetaminophen, phenylephrine, polyethylene glycol, simethicone, sodium chloride flush, traZODone   Vital Signs    Vitals:   08/16/20 0321 08/16/20 0425 08/16/20 0825 08/16/20 0829  BP: 125/87  117/85   Pulse: (!) 54  (!) 55 63  Resp: 18     Temp: 98.4 F (36.9 C)  98.2 F (36.8 C)   TempSrc:   Oral   SpO2: 99%  98%   Weight:  71 kg    Height:        Intake/Output Summary (Last 24 hours) at 08/16/2020 1116 Last data filed at 08/16/2020 0940 Gross per 24 hour  Intake 480 ml  Output 85 ml  Net 395 ml   Last 3 Weights 08/16/2020 08/15/2020 08/14/2020  Weight (lbs) 156 lb 9.6 oz 156 lb 8.4 oz 156 lb 12  oz  Weight (kg) 71.033 kg 71 kg 71.1 kg      Telemetry    Normal sinus rhythm- Personally Reviewed  ECG     - Personally Reviewed  Physical Exam   GEN: No acute distress.   Neck: No JVD Cardiac: RRR, no murmurs, rubs, or gallops.  Respiratory: Clear to auscultation bilaterally. GI: Soft, nontender, non-distended  MS: No edema; No deformity. Neuro:  Nonfocal  Psych: Normal affect   Labs    High Sensitivity Troponin:   Recent Labs  Lab 08/12/20 0946 08/12/20 1145 08/12/20 2149  TROPONINIHS 69* 82* 182*      Chemistry Recent Labs  Lab 08/12/20 0946 08/13/20 0510 08/14/20 0540 08/15/20 0454 08/16/20 0425  NA 136   < > 134* 134* 136  K 5.6*   < > 4.1 4.0 4.1  CL 92*   < > 91* 93* 95*  CO2 19*   < > 28 26 25   GLUCOSE 55*   < > 114* 101* 94  BUN 45*   < > 31* 41* 30*  CREATININE 7.47*   < > 4.87* 6.26* 5.28*  CALCIUM 10.5*   < > 8.8* 8.5* 8.6*  PROT 7.5  --   --  7.0 6.9  ALBUMIN 3.1*  --   --  2.9* 2.8*  AST 105*  --   --  235* 138*  ALT 96*  --   --  291* 217*  ALKPHOS 179*  --   --  135* 133*  BILITOT 2.0*  --   --  1.4* 1.5*  GFRNONAA 8*   < > 13* 9* 11*  ANIONGAP 25*   < > 15 15 16*   < > = values in this interval not displayed.     Hematology Recent Labs  Lab 08/13/20 0510 08/15/20 0454 08/16/20 0425  WBC 6.7 5.7 5.6  RBC 3.80* 3.51* 3.46*  HGB 11.8* 11.0* 10.9*  HCT 38.9* 36.0* 35.2*  MCV 102.4* 102.6* 101.7*  MCH 31.1 31.3 31.5  MCHC 30.3 30.6 31.0  RDW 17.6* 17.3* 17.3*  PLT 91* 89* 79*    BNPNo results for input(s): BNP, PROBNP in the last 168 hours.   DDimer No results for input(s): DDIMER in the last 168 hours.   Radiology    No results found.  Cardiac Studies   Echo Aug 14, 2020 1. Left ventricular ejection fraction, by estimation, is 25 to 30%. The  left ventricle has severely decreased function. The left ventricle  demonstrates global hypokinesis. There is mild left ventricular  hypertrophy. Left ventricular  diastolic parameters  are indeterminate.  2. Right ventricular systolic function is severely reduced. The right  ventricular size is moderately enlarged.  3. Left atrial size was severely dilated.  4. The mitral valve is degenerative. Mild mitral valve regurgitation.  Moderate mitral annular calcification.  5. The aortic valve has been repaired/replaced. Aortic valve  regurgitation is not visualized.  6. The inferior vena cava is normal in size with <50% respiratory  variability, suggesting right atrial pressure of 8 mmHg.   Patient Profile  65 year old gentleman with history of coronary disease, CABG June 2020, PCI vein graft to the PDA April 2021, ischemic cardiomyopathy ejection fraction 45 to 50% June 2021, severe aortic valve stenosis, mechanical AVR 2014, end-stage renal disease on hemodialysis, COVID-19 infection December 2020, PAD now right BKA, anemia of chronic disease, presenting with shortness of breath, wide-complex tachycardia   Assessment & Plan    Wide-complex tachycardia  Noted out in the field, initially felt to be VT by EMTs and started on amiodarone -Caught on EKG in the ER, wide-complex broke to flutter -Long history of fib flutter, long-term persistent -Amiodarone previously held as rate control pursued that he has had a difficult time with rate control, rate in the low 100s --- Discussed with EP, felt to be SVT versus aberrant conduction --- Decision made to start amiodarone for SVT ----- underwent cardioversion yesterday with one of our providers So far maintaining normal sinus rhythm, unclear if this will hold given he has been in fib flutter for quite some time --- If he does hold normal sinus rhythm, repeat echocardiogram as outpatient for new baseline -Heart rate now 50 to 60s, continue amiodarone 400 twice daily 1 week then 200 twice daily and lower dose metoprolol succinate 25 daily -On warfarin   Cardiomyopathy, ischemic  Known coronary artery  disease history of CABG  On Plavix and warfarin in the setting of mechanical aortic valve -Fluid management managed by dialysis -Now on midodrine to lower dry weight Elevated troponin in the setting of known coronary disease and tachycardia/demand ischemia   Long-term persistent atrial fibs/flutter  -This admission amiodarone initiated, metoprolol dose decreased, underwent cardioversion yesterday   End-stage renal disease  On hemodialysis Monday Wednesday Friday  On midodrine for blood  pressure support   PAD   status post right BKA earlier this year, some wound healing issues  He reports it is healing well currently after recent debridement   Hyperkalemia  5.6 on arrival, had dialysis, numbers improved  Long discussion with patient concerning above, case discussed with hospitalist service  Total encounter time more than 35 minutes  Greater than 50% was spent in counseling and coordination of care with the patient   For questions or updates, please contact [facility] Please consult [email or web address] for contact info under        Signed, Ida Rogue, MD  08/16/2020, 11:16 AM

## 2020-08-16 NOTE — Care Management Important Message (Signed)
Important Message  Patient Details  Name: Jeremy [name] MRN: [id number] Date of Birth: 07/03/1955   Medicare Important Message Given:  Yes     Dannette Barbara 08/16/2020, 1:08 PM

## 2020-08-16 NOTE — Discharge Summary (Signed)
[facility] NAME: Jeremy [name]    MR#:  [id number]  DATE OF BIRTH:  1955-12-23  DATE OF ADMISSION:  08/12/2020 ADMITTING PHYSICIAN: Ivor Costa, MD  DATE OF DISCHARGE: 08/16/2020  1:41 PM  PRIMARY CARE PHYSICIAN: Leone Haven, MD    ADMISSION DIAGNOSIS:  Hyperkalemia [E87.5] Wide-complex tachycardia ([place]) [I47.2] Fluid overload [E87.70]  DISCHARGE DIAGNOSIS:  Principal Problem:   Fluid overload Active Problems:   S/P AVR (aortic valve replacement)   ESRD (end stage renal disease) (HCC)   CAD (coronary artery disease)   Hyperlipidemia   Essential hypertension   OSA (obstructive sleep apnea)   Gout   Atrial fibrillation (HCC)   Hyperkalemia   Elevated troponin   PAD (peripheral artery disease) (HCC)   Anemia in ESRD (end-stage renal disease) (HCC)   Rheumatoid arthritis (HCC)   Acute on chronic systolic CHF (congestive heart failure) (HCC)   Chest pain   Hypoglycemia   Abnormal LFTs   V tach (HCC)   Leucocytosis   Thrombocytopenia (HCC)   Hx of right BKA (HCC)   Wide-complex tachycardia (HCC)   HFrEF (heart failure with reduced ejection fraction) (HCC)   Macrocytic anemia   SECONDARY DIAGNOSIS:   Past Medical History:  Diagnosis Date  . Abnormal stress test 03/19/2018  . 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 Anxiety    situation  . Aortic stenosis    a. 2014 s/p mech AVR ([facility])-->chronic coumadin; b. 08/2018 Echo: Mild AS/AI. Nl fxn/gradient; c. 09/2019 Echo: EF 45-50%, AoV mean grad 14mmHg.  . Arthritis   . CAD (coronary artery disease)    a. 2013 PCI: LAD 54m (2.75x28 Xience DES); b. 06/2018 Cath: LM min irregs, LAD 60p, 95/30/29m, LCX min irregs, RCA 40p, 38m, 90d, RPAV 90, RPDA 95ost; d. 09/2018 CABGx3 (LIMA->LAD, VG->D1, VG->PDA); e. 07/2019 PCI: LAD 90p, 58m, 77m ISR, 68m, D2 fills via L->L collats, LCX mild diff dzs, RCA 40p, 19m, 99d, RPAV 99, RPDA 95ost, VG->D2 100, LIMA->LAD ok, VG->RPDA 99 (4.5x15  Resolute Onyx DES).  . Chronic anticoagulation 06/2012   a. Coumadin in the setting of mech AVR and afib.  . Complication of anesthesia   . COVID-19 03/15/2019  . CPAP (continuous positive airway pressure) dependence   . Dialysis patient [facility]) 04/07/2006   Patient started HD around 1996,  had 1st transplant LLQ around 1998 until 2007 when they found renal cell cancer in transplant and both native kidneys, all were removed > went back on HD until 2nd transplant RLQ in 2014 which lasted 3 yrs; it was embolized for suspected acute rejection. Is currently on HD MWF in [place] w/ [facility] nephrology.  . ESRD (end stage renal disease) ([place])    a. 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 1999 s/p Kidney transplant; c. 2014 Bilat nephrectomy (transplanted kidney resected) in the setting of renal cell carcinoma; d. PD until 11/2017-->HD.  Marland Kitchen Heart murmur   . HFrEF (heart failure with reduced ejection fraction) ([place])    a. 07/2016 Echo: EF 55-60%; b. 02/2018 MV: EF 30%; c. 04/2018 Echo: EF 35%; d. 08/2018 Echo: EF 30-35%. Glob HK w/o rwma; e. 09/2019 Echo: EF 45-50%, mod LVH, basal-mid inf/inflat HK, nl RV fxn, mod dil LA, mildly dil RA. Nl AoV prosthesis (mean grad 82mmHg).  . History of blood transfusion   . Hx of colonoscopy    2009 by Dr. Laural Golden. Normal except for small submucosal lipoma. No evidence  of polyps or colitis.  . Hyperlipidemia   . Hypertension   . Hypogonadism in male 07/25/2015  . Ischemic cardiomyopathy    a. 07/2016 Echo: EF 55-60%; b. 02/2018 MV: EF 30%; c. 04/2018 Echo: EF 35%; d. 08/2018 Echo: EF 30-35%; e.  09/2019 Echo: EF 45-50%, mod LVH, basal-mid inf/inflat HK.  . Myocardial infarction ([place])   . OSA (obstructive sleep apnea)    No CPAP  . Paroxysmal atrial flutter ([place])   . Peripheral vascular disease ([place])    a. 09/2019 s/p L SFA and L profunda stenting; b. 12/2019 s/p R PT and TP trunk PTA; c. 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  Marland Kitchen Permanent atrial fibrillation (HCC)    a. CHA2DS2VASc =  3-->Coumadin (also mech AVR); b. 06/2018 Amio started 2/2 elevated rates.  Marland Kitchen PONV (postoperative nausea and vomiting)   . Renal cell carcinoma ([place]) 08/04/2016    HOSPITAL COURSE:   1.  Acute on chronic systolic congestive heart failure with fluid overload.  Dialysis yesterday remove fluid but they are limited on hypotension with dialysis.  Patient still has a cough with deep breath.  Most recent echo shows an EF of 25 to 30%.  The patient does not urinate so dialysis is the only way to manage fluid. 2.  SVT with aberrancy with history of chronic atrial flutter.  Patient had a successful cardioversion yesterday.  Patient was initially on amiodarone drip and will be on oral amiodarone 400 mg twice a day for 1 week and then 200 mg twice a day after that.  Patient's INR is 4.4 upon discharge.  We will hold Coumadin for 2 days and then restart 1 mg every other day.  Amiodarone can increase Coumadin level.  Cardiology wanted to continue low-dose Toprol. 3.  Aortic valve replacement and metallic valve secondary to aortic stenosis on warfarin. 4.  End-stage renal disease on hemodialysis Monday Wednesday Friday. 5.  Elevated liver function tests could be secondary to passive congestion with heart failure versus amiodarone IV.  AST peaked at 235 and came down to 138.  ALT peaked at 291 and came down to 217.  Recommend checking liver function test with follow-up appointment. 6.  Thrombocytopenia.  Hep C negative.  Looking back at old labs, the platelet count has been down for a while.  Continue to monitor as outpatient. 7.  Hyperkalemia on presentation.  This has improved with dialysis. 8.  Hypoglycemia on presentation improved with D50. 9.  Gout on allopurinol 10.  Macrocytic anemia.  B12 low normal range. 11.  Nonhealing wound right BKA site.  Continue dressings as outpatient.  Messaged by infectious disease that he was on IV antibiotics as outpatient with dialysis that completed recently.  Sedimentation rate  24.  White blood cell count normal range at 5.6.  In looking at the wound, I removed the dressing but left the opaque dressing on.  There was dried blood or scab along the wound but no pus.  Follow-up with vascular surgery as outpatient. 12.  Hypotension chronically.  Continue midodrine 10 mg 3 times a day.  Advised that he must take it especially before dialysis.  DISCHARGE CONDITIONS:   satisfactory  CONSULTS OBTAINED:  Treatment Team:  Nahser, Wonda Cheng, MD  DRUG ALLERGIES:   Allergies  Allergen Reactions  . Statins Other (See Comments)    Arthralgias (intolerance), Myalgias (intolerance)  Pt taking pravastatin   . Gabapentin     Drowsy   . Sertraline Rash    DISCHARGE MEDICATIONS:  Allergies as of 08/16/2020      Reactions   Statins Other (See Comments)   Arthralgias (intolerance), Myalgias (intolerance) Pt taking pravastatin    Gabapentin    Drowsy    Sertraline Rash      Medication List    STOP taking these medications   CHOLECALCIFEROL PO   dicyclomine 20 MG tablet Commonly known as: BENTYL   iron sucrose in sodium chloride 0.9 % 100 mL   methocarbamol 500 MG tablet Commonly known as: ROBAXIN   metoprolol tartrate 100 MG tablet Commonly known as: LOPRESSOR   predniSONE 5 MG tablet Commonly known as: DELTASONE     TAKE these medications   acetaminophen 650 MG CR tablet Commonly known as: TYLENOL Take 1,300 mg by mouth every 8 (eight) hours as needed for pain. What changed: Another medication with the same name was removed. Continue taking this medication, and follow the directions you see here.   albuterol 108 (90 Base) MCG/ACT inhaler Commonly known as: VENTOLIN HFA Inhale 2 puffs into the lungs every 4 (four) hours as needed for wheezing or shortness of breath.   allopurinol 100 MG tablet Commonly known as: ZYLOPRIM Take 1 tablet (100 mg total) by mouth daily.   amiodarone 200 MG tablet Commonly known as: PACERONE Two tablets po twice a  day for one week then one tablet twice a day afterwards   cinacalcet 30 MG tablet Commonly known as: SENSIPAR TAKE 1 TABLET BY MOUTH DAILY WITH FOOD (DO NOT TAKE LESS THAN 12 HOURS PRIOR TO DIALYSIS)   clopidogrel 75 MG tablet Commonly known as: PLAVIX Take 1 tablet (75 mg total) by mouth daily with breakfast.   ezetimibe 10 MG tablet Commonly known as: ZETIA TAKE 1 TABLET BY MOUTH EVERY DAY   Hydrogel Gel Apply gel to graft site of right lower extremity   hydroxychloroquine 200 MG tablet Commonly known as: PLAQUENIL Take 400 mg by mouth at bedtime.   lidocaine-prilocaine cream Commonly known as: EMLA Apply 1 application topically daily as needed (for fistula).   loperamide 2 MG tablet Commonly known as: IMODIUM A-D Take 2 mg by mouth 4 (four) times daily as needed for diarrhea or loose stools.   loratadine 10 MG tablet Commonly known as: CLARITIN Take 10 mg by mouth daily.   megestrol 40 MG/ML suspension Commonly known as: MEGACE Take 400 mg by mouth daily.   metoprolol succinate 25 MG 24 hr tablet Commonly known as: TOPROL-XL Take 1 tablet (25 mg total) by mouth daily. Start taking on: Aug 17, 2020   midodrine 10 MG tablet Commonly known as: PROAMATINE Take 1 tablet (10 mg total) by mouth 3 (three) times daily with meals. What changed:   when to take this  additional instructions   multivitamin Tabs tablet Take 1 tablet by mouth daily.   nitroGLYCERIN 0.4 MG SL tablet Commonly known as: NITROSTAT Place 1 tablet (0.4 mg total) under the tongue every 5 (five) minutes as needed for chest pain.   oxyCODONE-acetaminophen 5-325 MG tablet Commonly known as: PERCOCET/ROXICET Take 1 tablet by mouth every 4 (four) hours as needed.   pantoprazole 40 MG tablet Commonly known as: PROTONIX Take 1 tablet (40 mg total) by mouth daily.   phenylephrine 0.25 % suppository Commonly known as: (USE for PREPARATION-H) Place 1 suppository rectally 2 (two) times daily as  needed for hemorrhoids.   polyethylene glycol 17 g packet Commonly known as: MIRALAX / GLYCOLAX Take 17 g by mouth daily as needed for mild  constipation. What changed: Another medication with the same name was removed. Continue taking this medication, and follow the directions you see here.   rosuvastatin 40 MG tablet Commonly known as: CRESTOR TAKE 1 TABLET BY MOUTH DAILY AT 6 PM What changed: when to take this   sevelamer carbonate 800 MG tablet Commonly known as: RENVELA Take 3,200 mg by mouth 3 (three) times daily with meals.   traZODone 50 MG tablet Commonly known as: DESYREL TAKE 1/2 -1 TABLET (25-50 MG TOTAL) BY MOUTH AT BEDTIME AS NEEDED FOR SLEEP. What changed: See the new instructions.   warfarin 1 MG tablet Commonly known as: COUMADIN Take as directed. If you are unsure how to take this medication, talk to your nurse or doctor. Original instructions: Take 1 tablet (1 mg total) by mouth every other day. What changed: when to take this        DISCHARGE INSTRUCTIONS:   Follow up PMD 5 days Follow up cardiology 1-2 weeks Follow up with vascular surgery as outpatient  If you experience worsening of your admission symptoms, develop shortness of breath, life threatening emergency, suicidal or homicidal thoughts you must seek medical attention immediately by calling 911 or calling your MD immediately  if symptoms less severe.  You Must read complete instructions/literature along with all the possible adverse reactions/side effects for all the Medicines you take and that have been prescribed to you. Take any new Medicines after you have completely understood and accept all the possible adverse reactions/side effects.   Please note  You were cared for by a hospitalist during your hospital stay. If you have any questions about your discharge medications or the care you received while you were in the hospital after you are discharged, you can call the unit and asked to speak  with the hospitalist on call if the hospitalist that took care of you is not available. Once you are discharged, your primary care physician will handle any further medical issues. Please note that NO REFILLS for any discharge medications will be authorized once you are discharged, as it is imperative that you return to your primary care physician (or establish a relationship with a primary care physician if you do not have one) for your aftercare needs so that they can reassess your need for medications and monitor your lab values.    Today   CHIEF COMPLAINT:   Chief Complaint  Patient presents with  . Chest Pain  . Shortness of Breath    HISTORY OF PRESENT ILLNESS:  Jeremy [name]  is a 65 y.o. male came in with chest pain and shortness of breath   VITAL SIGNS:  Blood pressure 104/65, pulse 69, temperature 98.4 F (36.9 C), temperature source Oral, resp. rate 16, height 5\' 10"  (1.778 m), weight 71 kg, SpO2 100 %.  I/O:    Intake/Output Summary (Last 24 hours) at 08/16/2020 1641 Last data filed at 08/16/2020 1234 Gross per 24 hour  Intake 720 ml  Output --  Net 720 ml    PHYSICAL EXAMINATION:  GENERAL:  65 y.o.-year-old patient lying in the bed with no acute distress.  EYES: Pupils equal, round, reactive to light and accommodation. No scleral icterus. Extraocular muscles intact.  HEENT: Head atraumatic, normocephalic. Oropharynx and nasopharynx clear.  NECK:  Supple, no jugular venous distention. No thyroid enlargement, no tenderness.  LUNGS: Normal breath sounds bilaterally, no wheezing, rales,rhonchi or crepitation. No use of accessory muscles of respiration.  CARDIOVASCULAR: S1, S2 normal. No murmurs, rubs, or gallops.  ABDOMEN: Soft, non-tender, non-distended. Bowel sounds present. No organomegaly or mass.  EXTREMITIES: No pedal edema, cyanosis, or clubbing.  NEUROLOGIC: Cranial nerves II through XII are intact. Muscle strength 5/5 in all extremities. Sensation intact. Gait  not checked.  PSYCHIATRIC: The patient is alert and oriented x 3.  SKIN: No obvious rash, lesion, or ulcer.   DATA REVIEW:   CBC Recent Labs  Lab 08/16/20 0425  WBC 5.6  HGB 10.9*  HCT 35.2*  PLT 79*    Chemistries  Recent Labs  Lab 08/16/20 0425  NA 136  K 4.1  CL 95*  CO2 25  GLUCOSE 94  BUN 30*  CREATININE 5.28*  CALCIUM 8.6*  AST 138*  ALT 217*  ALKPHOS 133*  BILITOT 1.5*    Microbiology Results  Results for orders placed or performed during the hospital encounter of 08/12/20  Resp Panel by RT-PCR (Flu A&B, Covid) Nasopharyngeal Swab     Status: None   Collection Time: 08/12/20  9:46 AM   Specimen: Nasopharyngeal Swab; Nasopharyngeal(NP) swabs in vial transport medium  Result Value Ref Range Status   SARS Coronavirus 2 by RT PCR NEGATIVE NEGATIVE Final    Comment: (NOTE) SARS-CoV-2 target nucleic acids are NOT DETECTED.  The SARS-CoV-2 RNA is generally detectable in upper respiratory specimens during the acute phase of infection. The lowest concentration of SARS-CoV-2 viral copies this assay can detect is 138 copies/mL. A negative result does not preclude SARS-Cov-2 infection and should not be used as the sole basis for treatment or other patient management decisions. A negative result may occur with  improper specimen collection/handling, submission of specimen other than nasopharyngeal swab, presence of viral mutation(s) within the areas targeted by this assay, and inadequate number of viral copies(<138 copies/mL). A negative result must be combined with clinical observations, patient history, and epidemiological information. The expected result is Negative.  Fact Sheet for Patients:  EntrepreneurPulse.com.au  Fact Sheet for Healthcare Providers:  IncredibleEmployment.be  This test is no t yet approved or cleared by the [place] FDA and  has been authorized for detection and/or diagnosis of SARS-CoV-2  by FDA under an Emergency Use Authorization (EUA). This EUA will remain  in effect (meaning this test can be used) for the duration of the COVID-19 declaration under Section 564(b)(1) of the Act, 21 U.S.C.section 360bbb-3(b)(1), unless the authorization is terminated  or revoked sooner.       Influenza A by PCR NEGATIVE NEGATIVE Final   Influenza B by PCR NEGATIVE NEGATIVE Final    Comment: (NOTE) The Xpert Xpress SARS-CoV-2/FLU/RSV plus assay is intended as an aid in the diagnosis of influenza from Nasopharyngeal swab specimens and should not be used as a sole basis for treatment. Nasal washings and aspirates are unacceptable for Xpert Xpress SARS-CoV-2/FLU/RSV testing.  Fact Sheet for Patients: EntrepreneurPulse.com.au  Fact Sheet for Healthcare Providers: IncredibleEmployment.be  This test is not yet approved or cleared by the [place] FDA and has been authorized for detection and/or diagnosis of SARS-CoV-2 by FDA under an Emergency Use Authorization (EUA). This EUA will remain in effect (meaning this test can be used) for the duration of the COVID-19 declaration under Section 564(b)(1) of the Act, 21 U.S.C. section 360bbb-3(b)(1), unless the authorization is terminated or revoked.  Performed at [facility], [address] [place], [place] [address]   Culture, blood (Routine X 2) w Reflex to ID Panel     Status: None (Preliminary result)   Collection Time: 08/12/20  9:49 PM  Specimen: BLOOD  Result Value Ref Range Status   Specimen Description BLOOD BLOOD RIGHT FOREARM  Final   Special Requests   Final    BOTTLES DRAWN AEROBIC AND ANAEROBIC Blood Culture adequate volume   Culture   Final    NO GROWTH 4 DAYS Performed at [facility], [address] [place], [place] [address]    Report Status PENDING  Incomplete  Culture, blood (Routine X 2) w Reflex to ID Panel     Status: None (Preliminary result)    Collection Time: 08/12/20  9:50 PM   Specimen: BLOOD  Result Value Ref Range Status   Specimen Description BLOOD  RIGHT HAND  Final   Special Requests   Final    BOTTLES DRAWN AEROBIC AND ANAEROBIC Blood Culture adequate volume   Culture   Final    NO GROWTH 4 DAYS Performed at [facility], [address] [place], [place] [address]    Report Status PENDING  Incomplete   *Note: Due to a large number of results and/or encounters for the requested time period, some results have not been displayed. A complete set of results can be found in Results Review.     Management plans discussed with the patient, family and they are in agreement.  CODE STATUS:     Code Status Orders  (From admission, onward)         Start     Ordered   08/12/20 1918  Full code  Continuous        08/12/20 1917        Code Status History    Date Active Date Inactive Code Status Order ID Comments User Context   07/03/2020 0442 07/08/2020 2154 Full Code [id number]  Elwyn Reach, MD Inpatient   04/23/2020 2345 05/03/2020 1718 Full Code [id number]  Bary Leriche, PA-C Inpatient   12/21/2019 1543 12/24/2019 1637 Full Code [id number]  Gabriel Earing, PA-C Inpatient   09/29/2019 1650 10/07/2019 2253 Full Code [id number]  Leonides Schanz Inpatient   08/25/2019 1212 08/25/2019 1854 Full Code [id number]  Algernon Huxley, MD Inpatient   07/13/2019 1752 07/18/2019 1531 Full Code [id number]  Loel Dubonnet, NP Inpatient   03/15/2019 1257 03/19/2019 1417 Full Code [id number]  Ivor Costa, MD ED   09/13/2018 1653 09/22/2018 1756 Full Code [id number]  Wellington Hampshire, MD Inpatient   06/07/2018 1423 06/07/2018 1812 Full Code [id number]  Wellington Hampshire, MD Inpatient   07/17/2016 1848 07/22/2016 1741 Full Code [id number]  Baxter Hire, MD Inpatient   Advance Care Planning Activity      TOTAL TIME TAKING CARE OF THIS PATIENT: 35 minutes.    Loletha Grayer M.D on 08/16/2020 at 4:41 PM  Between 7am to 6pm -  Pager - [phone]  After 6pm go to [email or web address] - password [email or web address]  [facility]  CC: Primary care physician; Leone Haven, MD

## 2020-08-16 NOTE — Evaluation (Signed)
Occupational Therapy Evaluation Patient Details Name: Jeremy [name] MRN: [id number] DOB: 1956/03/08 Today's Date: 08/16/2020    History of Present Illness 65 y.o. male here with shortness of breath.  RVR and now s/p cardioversion 5/11. 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 Pt reports being MOD I at baseline with ADLs/ADL mobility with use of wheelchair. Pt does endorse requiring some increased help for bathing from his significant other recently. In addition, pt reports limited standing tolerance with AD as he has primarily used w/c over time since R BKA with exception of HHPT. Pt demos some UE and core weakness impacting his balance and ability to compensate with RW for standing balance through UE support. OT ed re: seated/chair level core and UE therex that patient can perform to improve strength as it pertains to balance and standing with RW in prep for eventual R LE prosthetic use. Pt with good understanding demo'ed on teach back. No further acute OT needs identified, but anticipate pt will benefit from f/u OT in [facility] setting.    Follow Up Recommendations  Home health OT    Equipment Recommendations  None recommended by OT    Recommendations for Other Services       Precautions / Restrictions Precautions Precautions: Fall Restrictions Weight Bearing Restrictions: No      Mobility Bed Mobility Overal bed mobility: Modified Independent             General bed mobility comments: increased time, good strength    Transfers Overall transfer level: Needs assistance   Transfers: Stand Pivot Transfers   Stand pivot transfers: Supervision       General transfer comment: able to seamlessly  t/f to/from w/c or bed SPS w/o assistance, one cue for positioning/safety: pt locks/unlocks brakes appropriately    Balance Overall balance assessment: Modified Independent                                         ADL either performed or assessed with clinical judgement   ADL Overall ADL's : Modified independent;At baseline                                       General ADL Comments: increased time, performed seated at baseline     Vision Patient Visual Report: No change from baseline       Perception     Praxis      Pertinent Vitals/Pain Pain Assessment: No/denies pain     Hand Dominance Right   Extremity/Trunk Assessment Upper Extremity Assessment Upper Extremity Assessment: Overall WFL for tasks assessed;Generalized weakness (ROM WFL, MMT grossly 3+ to 4-/5)   Lower Extremity Assessment Lower Extremity Assessment: Defer to PT evaluation;Generalized weakness;RLE deficits/detail;LLE deficits/detail RLE Deficits / Details: h/o BKA LLE Deficits / Details: ROM WFL, MMT grossly 4-/5       Communication     Cognition Arousal/Alertness: Awake/alert Behavior During Therapy: WFL for tasks assessed/performed Overall Cognitive Status: Within Functional Limits for tasks assessed  General Comments  (P) MOD I sitting balance. Standing balance unable to formally assess as pt is able to push to partial stand essential for transfers.    Exercises Other Exercises Other Exercises: OT facilitates ed re: seated core and UB therex to improve strength as it pertains to fall prevenetion and compensation in the setting of R BKA. pt with good understanding and carryover.   Shoulder Instructions      Home Living Family/patient expects to be discharged to:: Private residence Living Arrangements: Spouse/significant other Available Help at Discharge: Available PRN/intermittently;Other  (Comment);Family Type of Home: House Home Access: Stairs to enter;Ramped entrance Entrance Stairs-Number of Steps: 2 Entrance Stairs-Rails: None Home Layout: Able to live on main level with bedroom/bathroom;Two level;Laundry or work area in basement Alternate [employer] of Steps: Full flight of steps to basement   Bathroom Shower/Tub: Occupational psychologist: Standard Bathroom Accessibility: Yes How Accessible: Accessible via walker Home Equipment: [name] - 2 wheels;Walker - 4 wheels;Cane - single point;Electric scooter;Wheelchair - manual   Additional Comments: WC cushion;      Prior Functioning/Environment Level of Independence: Needs assistance  Gait / Transfers Assistance Needed: 20-74ft c walker with HHPT ([facility]); electric scooter in house, manual WC for BR trips, out of house/care transfers (has no means of tranposrting his scooter) ADL's / Homemaking Assistance Needed: recently mod I for ADL; OT has DC patient from [facility] services. Pt endorses now recieving some help for bathing from his significant other.   Comments: Pt uses scooter in the home and transfers independently, girlfried assist with some ADLs, most notedly bathing        OT Problem List: Decreased strength      OT Treatment/Interventions: Self-care/ADL training;Therapeutic exercise    OT Goals(Current goals can be found in the care plan section) Acute Rehab OT Goals Patient Stated Goal: go home OT Goal Formulation: All assessment and education complete, DC therapy  OT Frequency:     Barriers to D/C:            Co-evaluation              AM-PAC OT "6 Clicks" Daily Activity     Outcome Measure Help from another person eating meals?: None Help from another person taking care of personal grooming?: None Help from another person toileting, which includes using toliet, bedpan, or urinal?: None Help from another person bathing (including washing, rinsing, drying)?: A Little Help  from another person to put on and taking off regular upper body clothing?: None Help from another person to put on and taking off regular lower body clothing?: A Little 6 Click Score: 22   End of Session Equipment Utilized During Treatment: Gait belt Nurse Communication: Mobility status  Activity Tolerance: Patient tolerated treatment well Patient left: Other (comment) (in his personal wheelchair)  OT Visit Diagnosis: Muscle weakness (generalized) (M62.81)                Time: 5498-2641 OT Time Calculation (min): 25 min Charges:  OT General Charges $OT Visit: 1 Visit OT Evaluation $OT Eval Moderate Complexity: 1 Mod OT Treatments $Therapeutic Exercise: 8-22 mins  Gerrianne Scale, MS, OTR/L ascom [phone] 08/16/20, 5:25 PM

## 2020-08-17 ENCOUNTER — Encounter: Payer: Self-pay | Admitting: Emergency Medicine

## 2020-08-17 ENCOUNTER — Inpatient Hospital Stay: Payer: Medicare Other

## 2020-08-17 ENCOUNTER — Emergency Department: Payer: Medicare Other

## 2020-08-17 ENCOUNTER — Other Ambulatory Visit: Payer: Self-pay

## 2020-08-17 ENCOUNTER — Inpatient Hospital Stay
Admission: EM | Admit: 2020-08-17 | Discharge: 2020-09-05 | DRG: 296 | Disposition: E | Payer: Medicare Other | Attending: Pulmonary Disease | Admitting: Pulmonary Disease

## 2020-08-17 DIAGNOSIS — I499 Cardiac arrhythmia, unspecified: Secondary | ICD-10-CM | POA: Diagnosis not present

## 2020-08-17 DIAGNOSIS — D631 Anemia in chronic kidney disease: Secondary | ICD-10-CM | POA: Diagnosis not present

## 2020-08-17 DIAGNOSIS — D509 Iron deficiency anemia, unspecified: Secondary | ICD-10-CM | POA: Diagnosis not present

## 2020-08-17 DIAGNOSIS — I255 Ischemic cardiomyopathy: Secondary | ICD-10-CM | POA: Diagnosis present

## 2020-08-17 DIAGNOSIS — Z79818 Long term (current) use of other agents affecting estrogen receptors and estrogen levels: Secondary | ICD-10-CM

## 2020-08-17 DIAGNOSIS — E785 Hyperlipidemia, unspecified: Secondary | ICD-10-CM | POA: Diagnosis present

## 2020-08-17 DIAGNOSIS — I25110 Atherosclerotic heart disease of native coronary artery with unstable angina pectoris: Secondary | ICD-10-CM | POA: Diagnosis present

## 2020-08-17 DIAGNOSIS — R570 Cardiogenic shock: Secondary | ICD-10-CM | POA: Diagnosis present

## 2020-08-17 DIAGNOSIS — N2581 Secondary hyperparathyroidism of renal origin: Secondary | ICD-10-CM | POA: Diagnosis present

## 2020-08-17 DIAGNOSIS — G931 Anoxic brain damage, not elsewhere classified: Secondary | ICD-10-CM | POA: Diagnosis present

## 2020-08-17 DIAGNOSIS — G928 Other toxic encephalopathy: Secondary | ICD-10-CM | POA: Diagnosis present

## 2020-08-17 DIAGNOSIS — R0689 Other abnormalities of breathing: Secondary | ICD-10-CM | POA: Diagnosis not present

## 2020-08-17 DIAGNOSIS — Y830 Surgical operation with transplant of whole organ as the cause of abnormal reaction of the patient, or of later complication, without mention of misadventure at the time of the procedure: Secondary | ICD-10-CM | POA: Diagnosis present

## 2020-08-17 DIAGNOSIS — J9602 Acute respiratory failure with hypercapnia: Secondary | ICD-10-CM | POA: Diagnosis present

## 2020-08-17 DIAGNOSIS — I4821 Permanent atrial fibrillation: Secondary | ICD-10-CM | POA: Diagnosis present

## 2020-08-17 DIAGNOSIS — Z888 Allergy status to other drugs, medicaments and biological substances status: Secondary | ICD-10-CM

## 2020-08-17 DIAGNOSIS — Z955 Presence of coronary angioplasty implant and graft: Secondary | ICD-10-CM

## 2020-08-17 DIAGNOSIS — T8610 Unspecified complication of kidney transplant: Secondary | ICD-10-CM | POA: Diagnosis not present

## 2020-08-17 DIAGNOSIS — I469 Cardiac arrest, cause unspecified: Secondary | ICD-10-CM | POA: Diagnosis not present

## 2020-08-17 DIAGNOSIS — R000 Tachycardia, unspecified: Secondary | ICD-10-CM | POA: Diagnosis not present

## 2020-08-17 DIAGNOSIS — Z951 Presence of aortocoronary bypass graft: Secondary | ICD-10-CM

## 2020-08-17 DIAGNOSIS — I213 ST elevation (STEMI) myocardial infarction of unspecified site: Secondary | ICD-10-CM | POA: Diagnosis not present

## 2020-08-17 DIAGNOSIS — Z20822 Contact with and (suspected) exposure to covid-19: Secondary | ICD-10-CM | POA: Diagnosis present

## 2020-08-17 DIAGNOSIS — J9601 Acute respiratory failure with hypoxia: Secondary | ICD-10-CM | POA: Diagnosis present

## 2020-08-17 DIAGNOSIS — G4733 Obstructive sleep apnea (adult) (pediatric): Secondary | ICD-10-CM | POA: Diagnosis present

## 2020-08-17 DIAGNOSIS — I5023 Acute on chronic systolic (congestive) heart failure: Secondary | ICD-10-CM | POA: Diagnosis present

## 2020-08-17 DIAGNOSIS — I739 Peripheral vascular disease, unspecified: Secondary | ICD-10-CM | POA: Diagnosis present

## 2020-08-17 DIAGNOSIS — Z66 Do not resuscitate: Secondary | ICD-10-CM | POA: Diagnosis present

## 2020-08-17 DIAGNOSIS — Z952 Presence of prosthetic heart valve: Secondary | ICD-10-CM

## 2020-08-17 DIAGNOSIS — Z8249 Family history of ischemic heart disease and other diseases of the circulatory system: Secondary | ICD-10-CM

## 2020-08-17 DIAGNOSIS — Z993 Dependence on wheelchair: Secondary | ICD-10-CM

## 2020-08-17 DIAGNOSIS — E872 Acidosis: Secondary | ICD-10-CM | POA: Diagnosis present

## 2020-08-17 DIAGNOSIS — R404 Transient alteration of awareness: Secondary | ICD-10-CM | POA: Diagnosis not present

## 2020-08-17 DIAGNOSIS — Z7901 Long term (current) use of anticoagulants: Secondary | ICD-10-CM

## 2020-08-17 DIAGNOSIS — M1990 Unspecified osteoarthritis, unspecified site: Secondary | ICD-10-CM | POA: Diagnosis present

## 2020-08-17 DIAGNOSIS — I700 Atherosclerosis of aorta: Secondary | ICD-10-CM | POA: Diagnosis not present

## 2020-08-17 DIAGNOSIS — M069 Rheumatoid arthritis, unspecified: Secondary | ICD-10-CM | POA: Diagnosis present

## 2020-08-17 DIAGNOSIS — I2510 Atherosclerotic heart disease of native coronary artery without angina pectoris: Secondary | ICD-10-CM | POA: Diagnosis present

## 2020-08-17 DIAGNOSIS — Z79899 Other long term (current) drug therapy: Secondary | ICD-10-CM

## 2020-08-17 DIAGNOSIS — Z4659 Encounter for fitting and adjustment of other gastrointestinal appliance and device: Secondary | ICD-10-CM

## 2020-08-17 DIAGNOSIS — N186 End stage renal disease: Secondary | ICD-10-CM | POA: Diagnosis present

## 2020-08-17 DIAGNOSIS — Z85528 Personal history of other malignant neoplasm of kidney: Secondary | ICD-10-CM

## 2020-08-17 DIAGNOSIS — G253 Myoclonus: Secondary | ICD-10-CM | POA: Diagnosis not present

## 2020-08-17 DIAGNOSIS — I5022 Chronic systolic (congestive) heart failure: Secondary | ICD-10-CM | POA: Diagnosis not present

## 2020-08-17 DIAGNOSIS — I471 Supraventricular tachycardia: Secondary | ICD-10-CM | POA: Diagnosis not present

## 2020-08-17 DIAGNOSIS — Z515 Encounter for palliative care: Secondary | ICD-10-CM

## 2020-08-17 DIAGNOSIS — Z992 Dependence on renal dialysis: Secondary | ICD-10-CM | POA: Diagnosis not present

## 2020-08-17 DIAGNOSIS — N2889 Other specified disorders of kidney and ureter: Secondary | ICD-10-CM | POA: Diagnosis not present

## 2020-08-17 DIAGNOSIS — Z8616 Personal history of COVID-19: Secondary | ICD-10-CM

## 2020-08-17 DIAGNOSIS — I132 Hypertensive heart and chronic kidney disease with heart failure and with stage 5 chronic kidney disease, or end stage renal disease: Secondary | ICD-10-CM | POA: Diagnosis present

## 2020-08-17 DIAGNOSIS — T8612 Kidney transplant failure: Secondary | ICD-10-CM | POA: Diagnosis present

## 2020-08-17 DIAGNOSIS — J9600 Acute respiratory failure, unspecified whether with hypoxia or hypercapnia: Secondary | ICD-10-CM

## 2020-08-17 DIAGNOSIS — I959 Hypotension, unspecified: Secondary | ICD-10-CM | POA: Diagnosis not present

## 2020-08-17 DIAGNOSIS — R0902 Hypoxemia: Secondary | ICD-10-CM | POA: Diagnosis not present

## 2020-08-17 DIAGNOSIS — R791 Abnormal coagulation profile: Secondary | ICD-10-CM | POA: Diagnosis present

## 2020-08-17 DIAGNOSIS — Z4682 Encounter for fitting and adjustment of non-vascular catheter: Secondary | ICD-10-CM | POA: Diagnosis not present

## 2020-08-17 DIAGNOSIS — D638 Anemia in other chronic diseases classified elsewhere: Secondary | ICD-10-CM

## 2020-08-17 DIAGNOSIS — R4182 Altered mental status, unspecified: Secondary | ICD-10-CM | POA: Diagnosis not present

## 2020-08-17 DIAGNOSIS — J849 Interstitial pulmonary disease, unspecified: Secondary | ICD-10-CM | POA: Diagnosis present

## 2020-08-17 DIAGNOSIS — I252 Old myocardial infarction: Secondary | ICD-10-CM

## 2020-08-17 DIAGNOSIS — G8929 Other chronic pain: Secondary | ICD-10-CM | POA: Diagnosis present

## 2020-08-17 DIAGNOSIS — R569 Unspecified convulsions: Secondary | ICD-10-CM | POA: Diagnosis not present

## 2020-08-17 DIAGNOSIS — I10 Essential (primary) hypertension: Secondary | ICD-10-CM | POA: Diagnosis present

## 2020-08-17 DIAGNOSIS — M109 Gout, unspecified: Secondary | ICD-10-CM | POA: Diagnosis present

## 2020-08-17 DIAGNOSIS — Z978 Presence of other specified devices: Secondary | ICD-10-CM

## 2020-08-17 DIAGNOSIS — Z7902 Long term (current) use of antithrombotics/antiplatelets: Secondary | ICD-10-CM

## 2020-08-17 DIAGNOSIS — Z79891 Long term (current) use of opiate analgesic: Secondary | ICD-10-CM

## 2020-08-17 DIAGNOSIS — I517 Cardiomegaly: Secondary | ICD-10-CM | POA: Diagnosis not present

## 2020-08-17 DIAGNOSIS — R918 Other nonspecific abnormal finding of lung field: Secondary | ICD-10-CM | POA: Diagnosis not present

## 2020-08-17 DIAGNOSIS — J811 Chronic pulmonary edema: Secondary | ICD-10-CM | POA: Diagnosis not present

## 2020-08-17 DIAGNOSIS — F419 Anxiety disorder, unspecified: Secondary | ICD-10-CM | POA: Diagnosis present

## 2020-08-17 DIAGNOSIS — Z905 Acquired absence of kidney: Secondary | ICD-10-CM

## 2020-08-17 DIAGNOSIS — R609 Edema, unspecified: Secondary | ICD-10-CM | POA: Diagnosis not present

## 2020-08-17 DIAGNOSIS — I5020 Unspecified systolic (congestive) heart failure: Secondary | ICD-10-CM | POA: Diagnosis present

## 2020-08-17 DIAGNOSIS — Z89511 Acquired absence of right leg below knee: Secondary | ICD-10-CM

## 2020-08-17 LAB — COMPREHENSIVE METABOLIC PANEL
ALT: 153 U/L — ABNORMAL HIGH (ref 0–44)
AST: 97 U/L — ABNORMAL HIGH (ref 15–41)
Albumin: 2.8 g/dL — ABNORMAL LOW (ref 3.5–5.0)
Alkaline Phosphatase: 142 U/L — ABNORMAL HIGH (ref 38–126)
Anion gap: 18 — ABNORMAL HIGH (ref 5–15)
BUN: 16 mg/dL (ref 8–23)
CO2: 27 mmol/L (ref 22–32)
Calcium: 8.1 mg/dL — ABNORMAL LOW (ref 8.9–10.3)
Chloride: 94 mmol/L — ABNORMAL LOW (ref 98–111)
Creatinine, Ser: 3.74 mg/dL — ABNORMAL HIGH (ref 0.61–1.24)
GFR, Estimated: 17 mL/min — ABNORMAL LOW (ref >60)
Glucose, Bld: 136 mg/dL — ABNORMAL HIGH (ref 70–99)
Potassium: 3.2 mmol/L — ABNORMAL LOW (ref 3.5–5.1)
Sodium: 139 mmol/L (ref 135–145)
Total Bilirubin: 1.5 mg/dL — ABNORMAL HIGH (ref 0.3–1.2)
Total Protein: 7.2 g/dL (ref 6.5–8.1)

## 2020-08-17 LAB — BASIC METABOLIC PANEL
Anion gap: 13 (ref 5–15)
BUN: 18 mg/dL (ref 8–23)
CO2: 31 mmol/L (ref 22–32)
Calcium: 8 mg/dL — ABNORMAL LOW (ref 8.9–10.3)
Chloride: 96 mmol/L — ABNORMAL LOW (ref 98–111)
Creatinine, Ser: 3.78 mg/dL — ABNORMAL HIGH (ref 0.61–1.24)
GFR, Estimated: 17 mL/min — ABNORMAL LOW (ref >60)
Glucose, Bld: 130 mg/dL — ABNORMAL HIGH (ref 70–99)
Potassium: 3.2 mmol/L — ABNORMAL LOW (ref 3.5–5.1)
Sodium: 140 mmol/L (ref 135–145)

## 2020-08-17 LAB — CULTURE, BLOOD (ROUTINE X 2)
Culture: NO GROWTH
Culture: NO GROWTH
Special Requests: ADEQUATE
Special Requests: ADEQUATE

## 2020-08-17 LAB — MAGNESIUM: Magnesium: 2.1 mg/dL (ref 1.7–2.4)

## 2020-08-17 LAB — BLOOD GAS, ARTERIAL
Acid-base deficit: 13.8 mmol/L — ABNORMAL HIGH (ref 0.0–2.0)
Bicarbonate: 12.7 mmol/L — ABNORMAL LOW (ref 20.0–28.0)
FIO2: 0.6
MECHVT: 450 mL
O2 Saturation: 78.1 %
PEEP: 5 cmH2O
Patient temperature: 37
RATE: 18 resp/min
pCO2 arterial: 31 mmHg — ABNORMAL LOW (ref 32.0–48.0)
pH, Arterial: 7.22 — ABNORMAL LOW (ref 7.350–7.450)
pO2, Arterial: 52 mmHg — ABNORMAL LOW (ref 83.0–108.0)

## 2020-08-17 LAB — LACTIC ACID, PLASMA
Lactic Acid, Venous: 8.4 mmol/L (ref 0.5–1.9)
Lactic Acid, Venous: 2.5 mmol/L (ref 0.5–1.9)

## 2020-08-17 LAB — CBC WITH DIFFERENTIAL/PLATELET
Abs Immature Granulocytes: 0.1 10*3/uL — ABNORMAL HIGH (ref 0.00–0.07)
Basophils Absolute: 0.1 10*3/uL (ref 0.0–0.1)
Basophils Relative: 0 %
Eosinophils Absolute: 0.1 10*3/uL (ref 0.0–0.5)
Eosinophils Relative: 0 %
HCT: 37.4 % — ABNORMAL LOW (ref 39.0–52.0)
Hemoglobin: 10.8 g/dL — ABNORMAL LOW (ref 13.0–17.0)
Immature Granulocytes: 1 %
Lymphocytes Relative: 56 %
Lymphs Abs: 6.8 10*3/uL — ABNORMAL HIGH (ref 0.7–4.0)
MCH: 30.7 pg (ref 26.0–34.0)
MCHC: 28.9 g/dL — ABNORMAL LOW (ref 30.0–36.0)
MCV: 106.3 fL — ABNORMAL HIGH (ref 80.0–100.0)
Monocytes Absolute: 0.6 10*3/uL (ref 0.1–1.0)
Monocytes Relative: 5 %
Neutro Abs: 4.6 10*3/uL (ref 1.7–7.7)
Neutrophils Relative %: 38 %
Platelets: 92 10*3/uL — ABNORMAL LOW (ref 150–400)
RBC: 3.52 MIL/uL — ABNORMAL LOW (ref 4.22–5.81)
RDW: 17.2 % — ABNORMAL HIGH (ref 11.5–15.5)
Smear Review: NORMAL
WBC: 12.2 10*3/uL — ABNORMAL HIGH (ref 4.0–10.5)
nRBC: 0.2 % (ref 0.0–0.2)

## 2020-08-17 LAB — TROPONIN I (HIGH SENSITIVITY)
Troponin I (High Sensitivity): 214 ng/L (ref <18)
Troponin I (High Sensitivity): 615 ng/L (ref <18)
Troponin I (High Sensitivity): 824 ng/L (ref <18)
Troponin I (High Sensitivity): 961 ng/L (ref <18)

## 2020-08-17 LAB — GLUCOSE, CAPILLARY
Glucose-Capillary: 104 mg/dL — ABNORMAL HIGH (ref 70–99)
Glucose-Capillary: 103 mg/dL — ABNORMAL HIGH (ref 70–99)

## 2020-08-17 LAB — PROTIME-INR
INR: 3.6 — ABNORMAL HIGH (ref 0.8–1.2)
Prothrombin Time: 36.2 seconds — ABNORMAL HIGH (ref 11.4–15.2)

## 2020-08-17 LAB — RESP PANEL BY RT-PCR (FLU A&B, COVID) ARPGX2
Influenza A by PCR: NEGATIVE
Influenza B by PCR: NEGATIVE
SARS Coronavirus 2 by RT PCR: NEGATIVE

## 2020-08-17 LAB — PROCALCITONIN: Procalcitonin: 5.64 ng/mL

## 2020-08-17 LAB — APTT: aPTT: 38 seconds — ABNORMAL HIGH (ref 24–36)

## 2020-08-17 LAB — MRSA PCR SCREENING: MRSA by PCR: NEGATIVE

## 2020-08-17 MED ORDER — FENTANYL BOLUS VIA INFUSION
100.0000 ug | Freq: Once | INTRAVENOUS | Status: AC
  Administered 2020-08-17: 100 ug via INTRAVENOUS
  Filled 2020-08-17: qty 100

## 2020-08-17 MED ORDER — FENTANYL 2500MCG IN NS 250ML (10MCG/ML) PREMIX INFUSION
INTRAVENOUS | Status: AC
  Administered 2020-08-17: 100 ug/h via INTRAVENOUS
  Filled 2020-08-17: qty 250

## 2020-08-17 MED ORDER — SODIUM CHLORIDE 0.9 % IV SOLN: INTRAVENOUS | Status: DC

## 2020-08-17 MED ORDER — PANTOPRAZOLE SODIUM 40 MG IV SOLR
40.0000 mg | Freq: Every day | INTRAVENOUS | Status: DC
  Administered 2020-08-17 – 2020-08-19 (×3): 40 mg via INTRAVENOUS
  Filled 2020-08-17 (×3): qty 40

## 2020-08-17 MED ORDER — CHLORHEXIDINE GLUCONATE 0.12% ORAL RINSE (MEDLINE KIT)
15.0000 mL | Freq: Two times a day (BID) | OROMUCOSAL | Status: DC
  Administered 2020-08-17 – 2020-08-20 (×6): 15 mL via OROMUCOSAL

## 2020-08-17 MED ORDER — MIDAZOLAM HCL 2 MG/2ML IJ SOLN: 2.0000 mg | INTRAMUSCULAR | Status: DC | PRN

## 2020-08-17 MED ORDER — ORAL CARE MOUTH RINSE
15.0000 mL | OROMUCOSAL | Status: DC
  Administered 2020-08-17 – 2020-08-20 (×24): 15 mL via OROMUCOSAL

## 2020-08-17 MED ORDER — NOREPINEPHRINE 4 MG/250ML-% IV SOLN
0.0000 ug/min | INTRAVENOUS | Status: DC
  Administered 2020-08-17: 6 ug/min via INTRAVENOUS
  Filled 2020-08-17: qty 250

## 2020-08-17 MED ORDER — NOREPINEPHRINE 16 MG/250ML-% IV SOLN
0.0000 ug/min | INTRAVENOUS | Status: DC
  Administered 2020-08-18: 10 ug/min via INTRAVENOUS
  Administered 2020-08-19: 25 ug/min via INTRAVENOUS
  Administered 2020-08-19: 20 ug/min via INTRAVENOUS
  Administered 2020-08-20: 30 ug/min via INTRAVENOUS
  Administered 2020-08-20: 25 ug/min via INTRAVENOUS
  Filled 2020-08-17 (×4): qty 250

## 2020-08-17 MED ORDER — INSULIN ASPART 100 UNIT/ML IJ SOLN: 0.0000 [IU] | INTRAMUSCULAR | Status: DC

## 2020-08-17 MED ORDER — POLYETHYLENE GLYCOL 3350 17 G PO PACK
17.0000 g | PACK | Freq: Every day | ORAL | Status: DC
  Administered 2020-08-18: 17 g
  Filled 2020-08-17: qty 1

## 2020-08-17 MED ORDER — PROPOFOL 1000 MG/100ML IV EMUL
25.0000 ug/kg/min | INTRAVENOUS | Status: DC
  Administered 2020-08-17: 20 ug/kg/min via INTRAVENOUS
  Administered 2020-08-18 (×2): 80 ug/kg/min via INTRAVENOUS
  Administered 2020-08-18: 50 ug/kg/min via INTRAVENOUS
  Administered 2020-08-18 (×2): 80 ug/kg/min via INTRAVENOUS
  Administered 2020-08-18: 70 ug/kg/min via INTRAVENOUS
  Administered 2020-08-18: 80 ug/kg/min via INTRAVENOUS
  Administered 2020-08-19: 70 ug/kg/min via INTRAVENOUS
  Administered 2020-08-19: 20 ug/kg/min via INTRAVENOUS
  Filled 2020-08-17 (×11): qty 100

## 2020-08-17 MED ORDER — CHLORHEXIDINE GLUCONATE CLOTH 2 % EX PADS
6.0000 | MEDICATED_PAD | Freq: Every day | CUTANEOUS | Status: DC
  Administered 2020-08-17 – 2020-08-20 (×3): 6 via TOPICAL

## 2020-08-17 MED ORDER — ETOMIDATE 2 MG/ML IV SOLN
INTRAVENOUS | Status: AC | PRN
  Administered 2020-08-17: 10 mg via INTRAVENOUS

## 2020-08-17 MED ORDER — FENTANYL 2500MCG IN NS 250ML (10MCG/ML) PREMIX INFUSION: 100.0000 ug/h | INTRAVENOUS | Status: DC

## 2020-08-17 MED ORDER — FENTANYL BOLUS VIA INFUSION
50.0000 ug | INTRAVENOUS | Status: DC | PRN
  Filled 2020-08-17: qty 50

## 2020-08-17 MED ORDER — FENTANYL 2500MCG IN NS 250ML (10MCG/ML) PREMIX INFUSION
100.0000 ug/h | INTRAVENOUS | Status: DC
  Administered 2020-08-17: 100 ug/h via INTRAVENOUS
  Administered 2020-08-18: 150 ug/h via INTRAVENOUS
  Administered 2020-08-19: 100 ug/h via INTRAVENOUS
  Filled 2020-08-17 (×2): qty 250

## 2020-08-17 MED ORDER — ASPIRIN 300 MG RE SUPP
300.0000 mg | RECTAL | Status: AC
  Filled 2020-08-17: qty 1

## 2020-08-17 MED ORDER — ROCURONIUM BROMIDE 50 MG/5ML IV SOLN
INTRAVENOUS | Status: AC | PRN
  Administered 2020-08-17: 100 mg via INTRAVENOUS

## 2020-08-17 MED ORDER — DOCUSATE SODIUM 50 MG/5ML PO LIQD
100.0000 mg | Freq: Two times a day (BID) | ORAL | Status: DC
  Administered 2020-08-17 – 2020-08-18 (×2): 100 mg
  Filled 2020-08-17 (×3): qty 10

## 2020-08-17 NOTE — ED Notes (Signed)
Daughter at bedside.

## 2020-08-17 NOTE — Procedures (Addendum)
Arterial Line Placement: Indication: Frequent blood draws; Invasive BP monitoring.   Consent: Emergent.   Hand washing performed prior to starting the procedure.   Procedure: An active timeout was performed and correct patient, name, & ID confirmed. Physicial exam was performed to ensure adequate perfusion.  Using sterile technique, an aterial line was inserted into the RT Femoral artery.  Catheter threaded and the needle was removed with appropriate blood return.  Arterial waveform was noted.  After the procedure, the patient's extremities were observed to be pink and warm. This was placed after 2 attempts, patient has significant PAD    Estimated Blood Loss: None .   Number of Attempts: 1.   Complications: None .  Operator: Lillianah Swartzentruber.   Corrin Parker, M.D.  [facility]  Medical Director [place] Director [facility] Cardio-Pulmonary Department

## 2020-08-17 NOTE — Progress Notes (Signed)
GOALS OF CARE DISCUSSION  The Clinical status was relayed to family in detail. Daughter updated, works at [employer] as [employer] and notified of patients medical condition.    Patient remains unresponsive and will not open eyes to command.   Patient is having a weak cough and struggling to remove secretions.   Patient with increased WOB and using accessory muscles to breathe Explained to family course of therapy and the modalities    Patient with Progressive multiorgan failure with a very high probablity of a very minimal chance of meaningful recovery despite all aggressive and optimal medical therapy.  PATIENT REMAINS FULL CODE  Daughter understands the situation. RECOMMEND DNR STATUS START HYPOTHERMIA PROTOCOL HIGH RISK FOR CARDIAC ARREST   Family are satisfied with Plan of action and management. All questions answered  Additional CC time 40 mins   Carolos Fecher Patricia Pesa, M.D.  [facility]  Medical Director [place] Director [facility] Cardio-Pulmonary Department

## 2020-08-17 NOTE — Consult Note (Signed)
Cardiology Consultation:   Patient ID: Jeremy [name] MRN: [id number]; DOB: January 18, 1956  Admit date: 08/08/2020 Date of Consult: 08/15/2020  PCP:  Leone Haven, MD   [place]  Cardiologist:  Kathlyn Sacramento, MD  Advanced Practice Provider:  No care team member to display Electrophysiologist:  None [phone]    Patient Profile:   Jeremy [name] is a 65 y.o. 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 Mortimer Fries.  History of Present Illness:   [name] is a 65 year old male with PMH as above. H/o CAD dates back  to 2013 when he had a stent to his LAD.  In 2014, he underwent AVR with mechanical aortic valve and has been maintained on warfarin since then. 02/2018, [facility] MPI for progressive angina that showed a large inferior and inferior lateral scar with minimal reversibility.  EF 30%, confirmed by echo. 06/2018 diagnostic LHC showed severe multivessel CAD.  He underwent CABG x3 with LIMA to LAD, vein graft to diagonal, vein graft to RPDA. Repeat LHC 07/2019 for recurrent angina showed severe disease in the vein graft to RPDA with an occlusion of the vein graft to the diagonal.  The diagonal was fed with  left-to-right collaterals.  Previous LAD stent patent.  He underwent PCI/stenting to the vein graft to RPDA. He has history of PAD and underwent RLE intervention 04/2020 then right BKA.  He required I&D 04/4780 of R knee, complicated by wound infection requiring IV antibiotics.  He underwent debridement 07/2020.  He was recently seen in [facility] with 08/13/2020 consult for wide-complex tachycardia and volume overload.  Leading up to admission, he reported limited activity, though he had been wheelchair-bound since his BKA.  He had brief episodes of CP while getting into bed, lasting only seconds, resolving with deep breaths.  He reported feeling more fatigued than usual over the previous months, though improved energy on HD days.  His girlfriend called EMS due to increasing orthopnea and chest pressure. EMS found him to be in WCT verus SVT with aberrancy at 140 bpm. During admission required urgent dialysis for hyperkalemia and was continued on midodrine for BP support.  HS Tn mildly elevated and thought 2/2 demand ischemia. Echo 08/14/2020 showed EF reduced from previous 45 to 50%  25 to 30% with LV global hypokinesis, mild LVH, moderate RVE, severe LAE, mild MR with moderate calcification, evidence of AVR, RAP 8 mmHg. 08/15/2020, he underwent DCCV with amiodarone initiated during admission and continued at discharge.  Metoprolol dose decreased.  He was discharged on Plavix and warfarin in the setting of mechanical aortic valve. It was thought echo could be repeated once NSR to recheck EF, given EF reduced as above.   Most of the below history was obtained via chart biopsy and after speaking with the patient's daughter, Jeremy [name],  in person as well as the girlfriend via the telephone, Langley Gauss.   He was discharged 08/16/2020.  He went home after discharge and ate dinner, reportedly feeling fine, other than fatigue that reportedly he felt ever since his DCCV.  His girlfriend was not sure which pills he had received at the  hospital, but she reports she gave all his nighttime pills including the amiodarone.  He then did not sleep well that night, which is reportedly a usual occurrence. The next day, he took his AM pills.  He was still feeling fatigued but reportedly felt well enough to go to breakfast and then hemodialysis.  He told his girlfriend after hemodialysis that his blood pressure was fine during all of hemodialysis and everything went well.  He then ate some chips and went to the bathroom.  Approximately 5 minutes later, when his girlfriend did not hear an answer back to her shouting out to check on him, she decided to physically get up and check on him.  She found him on the toilet but slumped over and unresponsive. She notes his "fistula did not have the same bruit she usually noted." She called 911 immediately after finding him slumped over.  911 directed her to lower him to the floor and start CPR.    As above, she estimates that he was left alone for 5 minutes plus the time it took her to call 911 before she started CPR.  She continued CPR with the exception of running to the front door to make sure that it was open for the EMS personnel that later arrived and took over CPR for her.   Per review of notes, total estimated downtime was ~20 minutes.  Rhythm was PEA with no shocks provided and 2 rounds of epi.  Airway was placed in the field and replaced with ETT.    Initial labs showed potassium 3.2, creatinine 3.74, BUN 16, magnesium 2.1, AST 97, ALT 153, high-sensitivity troponin 214, lactic acid 8.4, WBC 12.2, hemoglobin 10.8, hematocrit 37.4, platelets 92, INR 3.6, glucose 136, respiratory panel negative.Chest x-ray showed cardiomegaly with pulmonary vascular congestion and suspicion for mild interstitial edema.  EKG as below without acute ST/T changes and not consistent with STEMI criteria.    Past Medical History:  Diagnosis Date  . Abnormal stress test 03/19/2018  . Anemia in chronic kidney disease  07/04/2015  . Anxiety    situation  . Aortic stenosis    a. 2014 s/p mech AVR ([facility])-->chronic coumadin; b. 08/2018 Echo: Mild AS/AI. Nl fxn/gradient; c. 09/2019 Echo: EF 45-50%, AoV mean grad 39mHg.  . Arthritis   . CAD (coronary artery disease)    a. 2013 PCI: LAD [id number] Xience DES); b. 06/2018 Cath: LM min irregs, LAD 60p, 95/30/66m, LCX min irregs, RCA 40p, 7026m0d, RPAV 90, RPDA 95ost; d. 09/2018 CABGx3 (LIMA->LAD, VG->D1, VG->PDA); e. 07/2019 PCI: LAD 90p, 14m80mm 36m 66m, 33mills via L->L collats, LCX mild diff dzs, RCA 40p, 80m, 93mRPAV 99, RPDA 95ost, VG->D2 100, LIMA->LAD ok, VG->RPDA 99 (4.5x15 Resolute Onyx DES).  . Chronic anticoagulation 06/2012   a. Coumadin in the setting of mech AVR and afib.  . Complication of anesthesia   . COVID-19 03/15/2019  . CPAP (continuous positive airway pressure) dependence   . Dialysis patient (HCC) [facility]   Patient started HD around 1996,  [place] when [place] found renal cell cancer in transplant and both native kidneys, all were removed >  went back on HD until 2nd transplant RLQ in 2014 which lasted 3 yrs; it was embolized for suspected acute rejection. Is currently on HD MWF in [place] w/ [facility] nephrology.  . ESRD (end stage renal disease) ([place])    a. CKD 2/2 to IgA nephropathy (dx age 61); b. 1999 s/p Kidney transplant; c. 2014 Bilat nephrectomy (transplanted kidney resected) in the setting of renal cell carcinoma; d. PD until 11/2017-->HD.  Marland Kitchen Heart murmur   . HFrEF (heart failure with reduced ejection fraction) ([place])    a. 07/2016 Echo: EF 55-60%; b. 02/2018 MV: EF 30%; c. 04/2018 Echo: EF 35%; d. 08/2018 Echo: EF 30-35%. Glob HK w/o rwma; e. 09/2019 Echo: EF 45-50%, mod LVH, basal-mid inf/inflat HK, nl RV fxn, mod dil LA, mildly dil RA. Nl AoV prosthesis (mean grad 70mHg).  . History of blood transfusion   . Hx of colonoscopy    2009 by Dr. RLaural Golden Normal except for small submucosal lipoma. No  evidence of polyps or colitis.  . Hyperlipidemia   . Hypertension   . Hypogonadism in male 07/25/2015  . Ischemic cardiomyopathy    a. 07/2016 Echo: EF 55-60%; b. 02/2018 MV: EF 30%; c. 04/2018 Echo: EF 35%; d. 08/2018 Echo: EF 30-35%; e.  09/2019 Echo: EF 45-50%, mod LVH, basal-mid inf/inflat HK.  . [place]ocardial infarction ([place]   . OSA (obstructive sleep apnea)    No CPAP  . Paroxysmal atrial flutter ([place]   . Peripheral vascular disease ([place]    a. 09/2019 s/p L SFA and L profunda stenting; b. 12/2019 s/p R PT and TP trunk PTA; c. 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  .Marland KitchenPermanent atrial fibrillation (HCC)    a. CHA2DS2VASc = 3-->Coumadin (also mech AVR); b. 06/2018 Amio started 2/2 elevated rates.  .Marland KitchenPONV (postoperative nausea and vomiting)   . Renal cell carcinoma ([place] 08/04/2016    Past Surgical History:  Procedure Laterality Date  . ABDOMINAL AORTOGRAM W/LOWER EXTREMITY N/A 12/22/2019   Procedure: ABDOMINAL AORTOGRAM W/LOWER EXTREMITY;  Surgeon: CMarty Heck MD;  [place] [place];  Service: Cardiovascular;  Laterality: N/A;  . AMPUTATION Right 04/14/2020   Procedure: RIGHT BELOW KNEE AMPUTATION;  Surgeon: CMarty Heck MD;  [place]  Service: Vascular;  Laterality: Right;  . AORTIC VALVE REPLACEMENT  3 /11 /2014   [place]   Procedure: APPLICATION OF WOUND VAC;  Surgeon: CMarty Heck MD;  [place]  Service: Vascular;  Laterality: Right;  . BACK SURGERY    . CAPD INSERTION N/A 02/19/2017   Procedure: LAPAROSCOPIC INSERTION CONTINUOUS AMBULATORY PERITONEAL DIALYSIS  (CAPD) CATHETER;  Surgeon: DAlgernon Huxley MD;  Location: [facility];  Service: General;  Laterality: N/A;  . CARDIOVERSION N/A 08/15/2020   Procedure: CARDIOVERSION;  Surgeon: AKate Sable MD;  Location: [facility];  Service: Cardiovascular;  Laterality: N/A;  . CORONARY ANGIOPLASTY WITH STENT PLACEMENT    . CORONARY  ARTERY BYPASS GRAFT N/A 09/16/2018   Procedure: CORONARY ARTERY BYPASS GRAFT times three      with left internal mammary artery and endoharvest of right leg greater saphenous vein;  Surgeon: HMelrose Nakayama MD;  [place]  Service: Open Heart Surgery;  Laterality: N/A;  . CORONARY STENT INTERVENTION N/A 07/15/2019   Procedure: CORONARY STENT INTERVENTION;  Surgeon: AWellington Hampshire MD;  [place];  Service: Cardiovascular;  Laterality: N/A;  . DG  AV DIALYSIS  SHUNT ACCESS EXIST*L* OR     left arm  . EPIGASTRIC HERNIA REPAIR N/A 02/19/2017   Procedure: HERNIA REPAIR EPIGASTRIC ADULT;  Surgeon: Robert Bellow, MD;  Location: [facility];  Service: General;  Laterality: N/A;  . EYE SURGERY     bilateral cataract  . HERNIA REPAIR     UMBILICAL HERNIA  . HERNIA REPAIR  02/19/2017  . INNER EAR SURGERY     20 years ago  . KIDNEY TRANSPLANT    . KNEE ARTHROSCOPY WITH MEDIAL MENISECTOMY Right 07/02/2020   Procedure: KNEE ARTHROSCOPY WITH INCISION AND DRAINAGE;  Surgeon: Leim Fabry, MD;  Location: [facility];  Service: Orthopedics;  Laterality: Right;  . LEFT HEART CATH AND CORS/GRAFTS ANGIOGRAPHY N/A 07/15/2019   Procedure: LEFT HEART CATH AND CORS/GRAFTS ANGIOGRAPHY;  Surgeon: Wellington Hampshire, MD;  Location: [place] [facility];  Service: Cardiovascular;  Laterality: N/A;  . LOWER EXTREMITY ANGIOGRAPHY Right 08/25/2019   Procedure: LOWER EXTREMITY ANGIOGRAPHY;  Surgeon: Algernon Huxley, MD;  Location: [place] [facility];  Service: Cardiovascular;  Laterality: Right;  . LOWER EXTREMITY ANGIOGRAPHY Left 09/29/2019   Procedure: Lower Extremity Angiography;  Surgeon: Algernon Huxley, MD;  Location: [place] [facility];  Service: Cardiovascular;  Laterality: Left;  . PERIPHERAL VASCULAR INTERVENTION Right 12/22/2019   Procedure: PERIPHERAL VASCULAR INTERVENTION;  Surgeon: Marty Heck, MD;  Location: [place] [facility] LAB;  Service: Cardiovascular;  Laterality: Right;  .  Peritoneal Dialysis access placement  02/19/2017  . PSEUDOANERYSM COMPRESSION N/A 09/29/2019   Procedure: PSEUDOANERYSM COMPRESSION;  Surgeon: Algernon Huxley, MD;  Location: [place] [facility];  Service: Cardiovascular;  Laterality: N/A;  . REMOVAL OF A DIALYSIS CATHETER N/A 11/23/2017   Procedure: REMOVAL OF A PERITONEAL DIALYSIS CATHETER;  Surgeon: Algernon Huxley, MD;  Location: [facility];  Service: Vascular;  Laterality: N/A;  . RIGHT/LEFT HEART CATH AND CORONARY ANGIOGRAPHY Bilateral 06/07/2018   Procedure: RIGHT/LEFT HEART CATH AND CORONARY ANGIOGRAPHY;  Surgeon: Wellington Hampshire, MD;  Location: [address] [facility];  Service: Cardiovascular;  Laterality: Bilateral;  . TEE WITHOUT CARDIOVERSION N/A 07/21/2016   Procedure: TRANSESOPHAGEAL ECHOCARDIOGRAM (TEE);  Surgeon: Wellington Hampshire, MD;  Location: [facility];  Service: Cardiovascular;  Laterality: N/A;  . TEE WITHOUT CARDIOVERSION N/A 09/16/2018   Procedure: TRANSESOPHAGEAL ECHOCARDIOGRAM (TEE);  Surgeon: Melrose Nakayama, MD;  Location: [place];  Service: Open Heart Surgery;  Laterality: N/A;  . WOUND DEBRIDEMENT Right 07/13/2020   Procedure: RIGHT BELOW KNEE AMPUTATION DEBRIDEMENT WITH [facility];  Surgeon: Marty Heck, MD;  [facility];  Service: Vascular;  Laterality: Right;     Home Medications:  Prior to Admission medications   Medication Sig Start Date End Date Taking? Authorizing Provider  acetaminophen (TYLENOL) 650 MG CR tablet Take 1,300 mg by mouth every 8 (eight) hours as needed for pain.    [provider]  albuterol (VENTOLIN HFA) 108 (90 Base) MCG/ACT inhaler Inhale 2 puffs into the lungs every 4 (four) hours as needed for wheezing or shortness of breath. 08/16/20   Loletha Grayer, MD  allopurinol (ZYLOPRIM) 100 MG tablet Take 1 tablet (100 mg total) by mouth daily. 05/03/20   Love, Ivan Anchors, PA-C  amiodarone (PACERONE) 200 MG tablet Two tablets po twice a day for one week then one tablet twice a  day afterwards 08/16/20   Loletha Grayer, MD  Carbomer Gel Base (HYDROGEL) GEL Apply gel to graft site of right lower extremity  08/07/20   Setzer, Edman Circle, PA-C  cinacalcet (SENSIPAR) 30 MG tablet TAKE 1 TABLET BY MOUTH DAILY WITH FOOD (DO NOT TAKE LESS THAN 12 HOURS PRIOR TO DIALYSIS) Patient not taking: Reported on 08/12/2020 07/16/20   [provider]  clopidogrel (PLAVIX) 75 MG tablet Take 1 tablet (75 mg total) by mouth daily with breakfast. 07/29/19   Wellington Hampshire, MD  ezetimibe (ZETIA) 10 MG tablet TAKE 1 TABLET BY MOUTH EVERY DAY Patient taking differently: Take 10 mg by mouth daily. 03/19/20   Dunn, Areta Haber, PA-C  hydroxychloroquine (PLAQUENIL) 200 MG tablet Take 400 mg by mouth at bedtime. 09/01/19   [provider]  lidocaine-prilocaine (EMLA) cream Apply 1 application topically daily as needed (for fistula).    [provider]  loperamide (IMODIUM A-D) 2 MG tablet Take 2 mg by mouth 4 (four) times daily as needed for diarrhea or loose stools.    [provider]  loratadine (CLARITIN) 10 MG tablet Take 10 mg by mouth daily.    [provider]  megestrol (MEGACE) 40 MG/ML suspension Take 400 mg by mouth daily. 05/04/20   [provider]  metoprolol succinate (TOPROL-XL) 25 MG 24 hr tablet Take 1 tablet (25 mg total) by mouth daily. 08/09/2020   Loletha Grayer, MD  midodrine (PROAMATINE) 10 MG tablet Take 1 tablet (10 mg total) by mouth 3 (three) times daily with meals. 08/16/20   Loletha Grayer, MD  multivitamin (RENA-VIT) TABS tablet Take 1 tablet by mouth daily. 02/23/19   [provider]  nitroGLYCERIN (NITROSTAT) 0.4 MG SL tablet Place 1 tablet (0.4 mg total) under the tongue every 5 (five) minutes as needed for chest pain. 06/24/19 03/13/20  Loel Dubonnet, NP  oxyCODONE-acetaminophen (PERCOCET/ROXICET) 5-325 MG tablet Take 1 tablet by mouth every 4 (four) hours as needed. 07/13/20   Ulyses Amor, PA-C  pantoprazole  (PROTONIX) 40 MG tablet Take 1 tablet (40 mg total) by mouth daily. 07/29/19   Wellington Hampshire, MD  phenylephrine (,USE FOR PREPARATION-H,) 0.25 % suppository Place 1 suppository rectally 2 (two) times daily as needed for hemorrhoids.    [provider]  polyethylene glycol (MIRALAX / GLYCOLAX) 17 g packet Take 17 g by mouth daily as needed for mild constipation. 05/03/20   Love, Ivan Anchors, PA-C  rosuvastatin (CRESTOR) 40 MG tablet TAKE 1 TABLET BY MOUTH DAILY AT 6 PM 08/16/20   Wellington Hampshire, MD  sevelamer carbonate (RENVELA) 800 MG tablet Take 3,200 mg by mouth 3 (three) times daily with meals.    [provider]  traZODone (DESYREL) 50 MG tablet TAKE 1/2 -1 TABLET (25-50 MG TOTAL) BY MOUTH AT BEDTIME AS NEEDED FOR SLEEP. Patient taking differently: Take 50 mg by mouth at bedtime as needed. 11/10/19   Leone Haven, MD  warfarin (COUMADIN) 1 MG tablet Take 1 tablet (1 mg total) by mouth every other day. 08/16/20   Loletha Grayer, MD    Inpatient Medications: Scheduled Meds: . aspirin  300 mg Rectal NOW  . insulin aspart  0-6 Units Subcutaneous Q4H  . pantoprazole (PROTONIX) IV  40 mg Intravenous QHS   Continuous Infusions: . sodium chloride    . fentaNYL infusion INTRAVENOUS 100 mcg/hr (08/07/2020 1620)  . norepinephrine (LEVOPHED) Adult infusion    . propofol (DIPRIVAN) infusion     PRN Meds: fentaNYL  Allergies:    Allergies  Allergen Reactions  . Statins Other (See Comments)    Arthralgias (intolerance), Myalgias (intolerance)  Pt taking pravastatin   . Gabapentin     Drowsy   . Sertraline Rash    Social History:   Social History   Socioeconomic History  . Marital status: Divorced    Spouse name: Not on file  . Number of children: 1  . Years of education: 57  . Highest education level: High school graduate  Occupational History  . Occupation: retired Forensic scientist -Disabled    Comment: Psychologist, educational  Tobacco Use  . Smoking status: Never Smoker   . Smokeless tobacco: Never Used  Vaping Use  . Vaping Use: Never used  Substance and Sexual Activity  . Alcohol use: Not Currently  . Drug use: No  . Sexual activity: Not Currently    Partners: Female  Other Topics Concern  . Not on file  Social History Narrative   Lives at home with girlfriend   1 daughter 10    Right handed    Social Determinants of Health   Financial Resource Strain: Not on file  Food Insecurity: Not on file  Transportation Needs: Not on file  Physical Activity: Not on file  Stress: Not on file  Social Connections: Not on file  Intimate Partner Violence: Not on file    Family History:    Family History  Problem Relation Age of Onset  . Cancer Mother        pancreatic  . Heart disease Father   . Diabetes Father      ROS:  Please see the history of present illness.  Review of Systems  Unable to perform ROS: Intubated    All other ROS reviewed and negative.     Physical Exam/Data:   Vitals:   08/12/2020 1600 08/15/2020 1615 08/14/2020 1620 09/01/2020 1630  BP: 127/82  130/72 (!) 125/95  Pulse: 60 90 62 89  Resp: (!) 21 (!) 23 (!) 21 (!) 21  Temp: 98.6 F (37 C) 98.7 F (37.1 C) 98.7 F (37.1 C) 98.7 F (37.1 C)  SpO2: 98% 100% 100% 100%  Weight:       No intake or output data in the 24 hours ending 08/07/2020 1655 Last 3 Weights 08/11/2020 08/16/2020 08/15/2020  Weight (lbs) 156 lb 8.4 oz 156 lb 9.6 oz 156 lb 8.4 oz  Weight (kg) 71 kg 71.033 kg 71 kg     Body mass index is 22.46 kg/m.  General:  Intubated and sedated HEENT: Intubated and sedated Lymph: no adenopathy Neck: JVD difficult to assess due to intubation Vascular: Radials 1+ bilaterally  Cardiac:  normal S1, S2; regular with extrasystole; 2/6 systolic murmur Lungs: coarse breath sounds on the ventillator   Abd: soft, nontender, no hepatomegaly  Ext: mild to moderate bilateral edema on the L side and R BKA  Musculoskeletal:  No deformities, BUE and BLE strength normal and  equal Skin: warm and dry  Neuro:  CNs 2-12 intact, no focal abnormalities noted Psych:  Normal affect   EKG:  The EKG was personally reviewed and demonstrates:  NSR with right bundle branch block and ventricular rate 95 bpm, prolonged QTC at 570 ms, baseline wander and artifact with nonspecific changes Telemetry:  Telemetry was personally reviewed and demonstrates:  NSR with brief runs of atrial tachycardia  Relevant CV Studies: Echo 08/14/20 Left ventricular ejection fraction, by estimation, is 25 to 30%. The  left ventricle has severely decreased function. The left ventricle  demonstrates global hypokinesis. There is mild left ventricular  hypertrophy. Left ventricular diastolic parameters  are indeterminate.  2. Right ventricular systolic function is severely reduced. The right  ventricular size is moderately enlarged.  3. Left atrial size was severely dilated.  4. The mitral valve is degenerative. Mild mitral valve regurgitation.  Moderate mitral annular calcification.  5. The aortic valve has been repaired/replaced. Aortic valve  regurgitation is not visualized.  6. The inferior vena cava is normal in size with <50% respiratory  variability, suggesting right atrial pressure of 8 mmHg.   LHC 07/15/19  Prox LAD lesion is 90% stenosed.  Mid LAD-1 lesion is 95% stenosed.  Mid LAD-2 lesion is 30% stenosed.  Mid LAD to Dist LAD lesion is 60% stenosed.  Prox RCA lesion is 40% stenosed.  Mid RCA lesion is 70% stenosed.  Dist RCA lesion is 99% stenosed with 99% stenosed side branch in RPAV.  Ost RPDA to RPDA lesion is 95% stenosed.  SVG.  Origin lesion is 100% stenosed.  LIMA graft was visualized by angiography.  The graft exhibits no disease.  Origin to Prox Graft lesion is 99% stenosed.  Post intervention, there is a 0% residual stenosis.  A drug-eluting stent was successfully placed using a STENT RESOLUTE ONYX 4.5X15. 1.  Severe underlying two-vessel  coronary artery disease including the LAD and right coronary artery.  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  Patent LIMA to LAD and occluded SVG to diagonal.  The diagonal receives collaterals from the distal LAD.  Patent SVG to right PDA with 99% ostial stenosis. 2.  Successful angioplasty and drug-eluting stent placement to SVG to right PDA. 3.  Small hematoma in the right groin noted during the diagnostic portion of the cath with suspecting oozing around the sheath due to heavy calcifications in the common femoral artery.  This improved with upsizing the sheath and manual pressure.  This was stable at the end of the case after deploying a minx closure device and holding additional 10 minutes of pressure. Recommendations: Resume heparin drip 8 hours after sheath pull after making sure that the right groin hematoma is stable. Resume warfarin tonight. The patient was started on clopidogrel and he is currently on low-dose aspirin.  I do not think he will tolerate triple therapy.  I recommend stopping aspirin before hospital discharge. For atrial fibrillation, I switch carvedilol to metoprolol tartrate 25 mg twice daily. Coronary Diagrams   Diagnostic Dominance: Right    Intervention      Laboratory Data:  High Sensitivity Troponin:   Recent Labs  Lab 08/12/20 0946 08/12/20 1145 08/12/20 2149 08/26/2020 1520  TROPONINIHS 69* 82* 182* 214*     Chemistry Recent Labs  Lab 08/15/20 0454 08/16/20 0425 09/02/2020 1520  NA 134* 136 139  K 4.0 4.1 3.2*  CL 93* 95* 94*  CO2 '26 25 27  ' GLUCOSE 101* 94 136*  BUN 41* 30* 16  CREATININE 6.26* 5.28* 3.74*  CALCIUM 8.5* 8.6* 8.1*  GFRNONAA 9* 11* 17*  ANIONGAP 15 16* 18*    Recent Labs  Lab 08/15/20 0454 08/16/20 0425 08/24/2020 1520  PROT 7.0 6.9 7.2  ALBUMIN 2.9* 2.8* 2.8*  AST 235* 138* 97*  ALT 291* 217* 153*  ALKPHOS 135* 133* 142*  BILITOT 1.4* 1.5* 1.5*    Hematology Recent Labs  Lab 08/15/20 0454 08/16/20 0425 08/14/2020 1520  WBC 5.7 5.6 12.2*  RBC 3.51* 3.46* 3.52*  HGB 11.0* 10.9* 10.8*  HCT 36.0* 35.2* 37.4*  MCV 102.6* 101.7* 106.3*  MCH 31.3 31.5 30.7  MCHC 30.6 31.0  28.9*  RDW 17.3* 17.3* 17.2*  PLT 89* 79* 92*   BNPNo results for input(s): BNP, PROBNP in the last 168 hours.  DDimer No results for input(s): DDIMER in the last 168 hours.   Radiology/Studies:  DG Chest Portable 1 View  Result Date: 08/25/2020 CLINICAL DATA:  Status post intubation EXAM: PORTABLE CHEST 1 VIEW COMPARISON:  Chest x-ray from earlier same day. FINDINGS: Endotracheal tube is now positioned just below the level of the clavicles, approximately 5 cm above the carina. Enteric tube passes below the diaphragm. Stable cardiomegaly. Stable central pulmonary vascular congestion and interstitial prominence. No pleural effusion or pneumothorax is seen. IMPRESSION: 1. Endotracheal tube now positioned with tip just below the level of the clavicles, approximately 5 cm above the carina. 2. Central pulmonary vascular congestion and probable bilateral interstitial edema. Electronically Signed   By: Franki Cabot M.D.   On: 08/14/2020 15:54   DG Chest Portable 1 View  Result Date: 08/23/2020 CLINICAL DATA:  Hypoxia EXAM: PORTABLE CHEST 1 VIEW COMPARISON:  Aug 12, 2020 FINDINGS: Endotracheal tube tip is 3.1 cm above the carina. Nasogastric tube side port seen in stomach with tip below diaphragm, not seen. No pneumothorax. There is cardiomegaly with pulmonary venous hypertension. There is interstitial prominence, likely due to a degree of interstitial pulmonary edema. No airspace opacity. No pleural effusions evident. Patient is status post coronary artery bypass grafting. There is aortic atherosclerosis. No evident adenopathy. Old healed fracture right posterior sixth rib. IMPRESSION: Tube positions as described without pneumothorax. Cardiomegaly with pulmonary vascular  congestion. Suspect mild interstitial edema. No consolidation. Status post coronary artery bypass grafting. Aortic Atherosclerosis (ICD10-I70.0). Electronically Signed   By: Lowella Grip III M.D.   On: 08/08/2020 15:44   ECHOCARDIOGRAM COMPLETE  Result Date: 08/14/2020    ECHOCARDIOGRAM REPORT   Patient Name:   Jeremy [name] Date of Exam: 08/14/2020 Medical Rec #:  [id number]   Height:       70.0 in Accession #:    [id number]  Weight:       156.7 lb Date of Birth:  [birth year]   BSA:          1.882 m Patient Age:    16 years    BP:           89/67 mmHg Patient Gender: M           HR:           114 bpm. Exam Location:  [facility] Procedure: 2D Echo, Cardiac Doppler and Color Doppler Indications:     Ventriculat Tachycardia I47.2  History:         Patient has prior history of Echocardiogram examinations, most                  recent 10/01/2019. Previous Myocardial Infarction. ESRD, PAF.  Sonographer:     Sherrie Sport RDCS (AE) Referring Phys:  [place] Phys: Kate Sable MD IMPRESSIONS  1. Left ventricular ejection fraction, by estimation, is 25 to 30%. The left ventricle has severely decreased function. The left ventricle demonstrates global hypokinesis. There is mild left ventricular hypertrophy. Left ventricular diastolic parameters  are indeterminate.  2. Right ventricular systolic function is severely reduced. The right ventricular size is moderately enlarged.  3. Left atrial size was severely dilated.  4. The mitral valve is degenerative. Mild mitral valve regurgitation. Moderate mitral annular calcification.  5. The aortic valve has been repaired/replaced. Aortic valve regurgitation is not visualized.  6. The inferior vena cava is normal in size with <50% respiratory variability, suggesting right atrial pressure of 8 mmHg. FINDINGS  Left Ventricle: Left ventricular ejection fraction, by estimation, is 25 to 30%. The left ventricle has severely decreased function. The left ventricle  demonstrates global hypokinesis. The left ventricular internal cavity size was normal in size. There is mild left ventricular hypertrophy. Left ventricular diastolic parameters are indeterminate. Right Ventricle: The right ventricular size is moderately enlarged. No increase in right ventricular wall thickness. Right ventricular systolic function is severely reduced. Left Atrium: Left atrial size was severely dilated. Right Atrium: Right atrial size was normal in size. Pericardium: There is no evidence of pericardial effusion. Mitral Valve: The mitral valve is degenerative in appearance. Moderate mitral annular calcification. Mild mitral valve regurgitation. Tricuspid Valve: The tricuspid valve is normal in structure. Tricuspid valve regurgitation is mild. Aortic Valve: The aortic valve has been repaired/replaced. Aortic valve regurgitation is not visualized. Aortic valve mean gradient measures 5.0 mmHg. Aortic valve peak gradient measures 8.4 mmHg. Aortic valve area, by VTI measures 1.97 cm. There is a mechanical valve present in the aortic position. Pulmonic Valve: The pulmonic valve was grossly normal. Pulmonic valve regurgitation is not visualized. Aorta: The aortic root is normal in size and structure. Venous: The inferior vena cava is normal in size with less than 50% respiratory variability, suggesting right atrial pressure of 8 mmHg. IAS/Shunts: No atrial level shunt detected by color flow Doppler.  LEFT VENTRICLE PLAX 2D LVIDd:         5.57 cm LVIDs:         4.52 cm LV PW:         1.35 cm LV IVS:        1.01 cm LVOT diam:     2.00 cm LV SV:         36 LV SV Index:   19 LVOT Area:     3.14 cm  RIGHT VENTRICLE RV Basal diam:  6.33 cm RV S prime:     4.46 cm/s TAPSE (M-mode): 3.4 cm LEFT ATRIUM              Index       RIGHT ATRIUM           Index LA diam:        5.30 cm  2.82 cm/m  RA Area:     35.50 cm LA Vol (A2C):   178.0 ml 94.58 ml/m RA Volume:   153.00 ml 81.30 ml/m LA Vol (A4C):   163.0 ml 86.61  ml/m LA Biplane Vol: 171.0 ml 90.86 ml/m  AORTIC VALVE                    PULMONIC VALVE AV Area (Vmax):    1.47 cm     PV Vmax:        0.45 m/s AV Area (Vmean):   1.48 cm     PV Peak grad:   0.8 mmHg AV Area (VTI):     1.97 cm     RVOT Peak grad: 2 mmHg AV Vmax:           144.67 cm/s AV Vmean:          105.667 cm/s AV VTI:            0.185 m AV Peak Grad:      8.4 mmHg AV Mean Grad:      5.0 mmHg LVOT Vmax:  67.90 cm/s LVOT Vmean:        49.800 cm/s LVOT VTI:          0.116 m LVOT/AV VTI ratio: 0.63  AORTA Ao Root diam: 3.20 cm MITRAL VALVE                TRICUSPID VALVE MV Area (PHT): 3.87 cm     TR Peak grad:   31.8 mmHg MV Decel Time: 196 msec     TR Vmax:        282.00 cm/s MV E velocity: 129.00 cm/s                             SHUNTS                             Systemic VTI:  0.12 m                             Systemic Diam: 2.00 cm Kate Sable MD Electronically signed by Kate Sable MD Signature Date/Time: 08/14/2020/12:10:33 PM    Final      Assessment and Plan:   Cardiac arrest Cardiogenic shock History of systolic HF (EF 28-41%) History of CAD s/p CABG Elevated HS Tn  -- Recently discharged 08/16/2020 after admission for [facility], during which time emergent hemodialysis was performed for volume status and hyperkalemia and DCCV for atrial fibrillation/flutter.  He was discharged on amiodarone and maintaining sinus at that time.  He was home for less than 24 hours and completed one hemodialysis session today 08/23/2020 without report of any chest pain or shortness of breath, though he did note fatigue.  After hemodialysis, he went to the bathroom and was found 5 minutes later slumped over the toilet with CPR started by his girlfriend and continued by EMS.  Total estimated downtime approximately 20 minutes.  Per EMS, initial rhythm PEA with no shocks delivered and 2 rounds of epinephrine.  He is currently intubated and on mechanical ventilator support.  Initial EKG not consistent  with STEMI.  High-sensitivity troponin 214.  Lactic acid 8.4.  Plan is for admission to the ICU. Currently hemodynamically stable.  --Daily CBC and BMET.    Nephrology is following and has seen the pt today.  Maintain electrolytes at goal with initial K 3.2. --Serial EKGs. Continue to cycle HS Tn. As above, initial EKG not consistent with STEMI. Lower suspicion for ACS at this time. No current plan for emergent catheterization.  --Could consider echo at bedside - previous echo as above with EF reduced to 25-30% in the setting of Afib/flutter and before 5/11 DCCV with plans to do a repeat echo in NSR.  No TEE before DCCV to ensure no LA thrombus, though he was on anticoagulation with Warfarin (INR 3.6.).   Rule out acute structural changes, reassess EF,  RH function.   Ensure no pericardial effusion, cardiac tamponade. Reassess valvular function.   Considered is pulmonary embolism, though on anticoagulation, less suspicious. TEE not performed at time of DCCV. INR 3.6. -- Continue mechanical ventilator support and bronchodilator therapy per critical care team. Continue vasopressors per CCM. Plan for hypothermia protocol if head CT non-acute. Workup of lactic acidosis per CCM. Overall prognosis poor at this time given comorbids and down-time of 20 minutes. Further recommendations per MD.  Atrial fibrillation/atrial flutter s/p DCCV 5/11 -- S/p DCCV 5/11  and maintaining NSR at discharge.  Initial EKG shows NSR with RBBB.  Review of telemetry shows significant artifact with possible brief SVT/AT. Oral medications held at this time.   ESRD on HD --Per nephrology. Daily BMET. Maintain electrolytes at goal.   Remainder per IM.   For questions or updates, please contact [place] Please consult [email or web address] for contact info under    Signed, Arvil Chaco, PA-C  09/01/2020 4:55 PM

## 2020-08-17 NOTE — Progress Notes (Signed)
The ETT was pulled back from 26 cm at the lip to 24cm at the lip after the Dr. Reviewed the cxr.

## 2020-08-17 NOTE — ED Notes (Signed)
MD at bedside. RT at bedside assisting ventilations.

## 2020-08-17 NOTE — Progress Notes (Signed)
The ETT was  Pulled out  to 25 at the lip.

## 2020-08-17 NOTE — ED Notes (Signed)
Pt arrived unresponsive and is currently intubated, pt is unable to sign MSE waiver.

## 2020-08-17 NOTE — H&P (Signed)
NAME:  JAHKARI MACLIN, MRN:  [id number], DOB:  July 31, 1955, LOS: 0 ADMISSION DATE:  08/31/2020 REFERRING MD:  Tamala Julian CHIEF COMPLAINT: cardiac arrest    History of Present Illness:  65 yo white male with multiple medical issues,  AVR on COUMADIN ESRD-HD, MWF status post mechanical AVR on Coumadin, atrial fibrillation, hypertension, hyperlipidemia, gout, RA, thrombocytopenia, OSA on CPAP, CAD, CABG, systolic CHF , PVD, anemia, renal cell CA, failed kidney transplantation Admitted to ICU for acute cardiac arrest and severe acute hypoxic resp failure   SEQUENCE OF EVENTS Significant Hospital Events: Including procedures, antibiotic start and stop dates in addition to other pertinent events    5/9 admitted for syncope,emergent HD needed, high K, wide complex tachy EMS also noted EKG concerning for V. tach and patient was given 1 g calcium and 150 mg bolus of amiodarone.    08/12/2020 - 08/14/20 -Patient underwent back-to-back hemodialysis, ultimately on admission on 5/8 and then resumed his schedule, second HD on 5/9 -Cardiology was consulted due to atrial flutter, ?Vtach per EMS.  -HR still uncontrolled, in atrial flutter, started amiodarone IV for rate control, possible cardioversion on 5/11 if patient continues to be tachycardiac  5/10 another round of HD provided, ECHO EF 25% 5/11 s/p cardioversion 5/12 discharged home  5/13  Patient reportedly was dialyzed earlier today, and then family found him down and pulseless this afternoon while in the bathroom,  Family started CPR, then fire, then EMS reports an additional 10 minutes of CPR.  Sounds like 20 minutes of total downtime.  Initial rhythm was PEA.  No shocks provided.  2 rounds of epi and King airway was placed in the field, replaced with ETT  5/13 ICU team called for admission, plan for Hypothermia protocol if CT head is NEG   Pertinent  Medical History   Active Ambulatory Problems    Diagnosis Date Noted   S/P AVR (aortic valve  replacement) 06/23/2012   ESRD (end stage renal disease) ([place]) 07/26/2012   CAD (coronary artery disease) 05/26/2011   Hyperlipidemia 07/26/2012   Essential hypertension 05/19/2011   History of kidney transplant 12/15/2012   OSA (obstructive sleep apnea)    Gout 04/24/2015   Anemia in chronic kidney disease 07/04/2015   Hypogonadism in male 07/25/2015   Fever 07/17/2016   History of renal cell carcinoma 08/04/2016   Ventral hernia 10/14/2016   SVT (supraventricular tachycardia) ([place]) 10/30/2016   Chronic rejection of kidney transplant 02/11/2017   Depression 05/17/2017   AV fistula ([place]) 05/17/2017   Chronic pain of both knees 05/17/2017   Arthralgia 05/17/2017   Aortic valve disorder 06/14/2012   Chronic anticoagulation 11/06/2014   Heart failure ([place]) 05/30/2012   Immunosuppressive management encounter following kidney transplant 12/15/2012   Iron deficiency anemia 11/02/2014   Lumbar radiculopathy 09/06/2012   Thyroid nodule 06/26/2011   Osteoarthritis 03/17/2017   Proteinuria 01/03/2015   S/P angioplasty with stent 09/07/2012   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[place]) 08/16/2013   Spinal stenosis 06/20/2013   IBS (irritable bowel syndrome) 03/19/2018   Tremor 12/30/2012   Trigger little finger of right hand 03/17/2017   Vitamin D deficiency 08/16/2013   Renal transplant failure and rejection 07/31/2016   H/O mitral valve replacement with mechanical valve 09/07/2012   Epigastric hernia 02/08/2014   Hydrocele 08/16/2018   S/P CABG x 3 22/97/9892   Complication of vascular access for dialysis 10/01/2018   Skin lesion 11/17/2018   Hyperkalemia 03/17/2017   Liver disease, unspecified 03/17/2017   Foot ulcer (  [place]) 05/27/2019   Atherosclerosis of native arteries of the extremities with ulceration ([place]) 07/05/2019   Unstable angina ([place]) 07/13/2019   Hematoma of thigh, left, initial encounter 09/29/2019   Elevated troponin 10/01/2019   Thigh hematoma 10/01/2019    Postprocedural hypotension    Non-ST elevation (NSTEMI) myocardial infarction [facility])    Demand ischemia (HCC)    Atrial flutter (HCC)    Contusion of unspecified thigh, initial encounter 10/11/2019   Other acute postprocedural pain 10/11/2019   Fatigue 10/19/2019   Difficulty sleeping 10/19/2019   ILD (interstitial lung disease) ([place]) 12/06/2019   Cough 12/06/2019   PAD (peripheral artery disease) ([place]) 12/21/2019   Ischemic neuropathy of right foot 03/20/2020   Femoral neuropathy of left lower extremity 03/20/2020   Critical lower limb ischemia ([place]) 04/14/2020   Below-knee amputation of left lower extremity ([place]) 04/23/2020   Anemia in ESRD (end-stage renal disease) ([place])    Macrocytosis    Rheumatoid arthritis ([place])    Postoperative pain    Peripheral vascular disease ([place]) 12/26/2019   Bronchitis 05/24/2020   Weight loss 05/31/2020   Hypercalcemia 06/12/2020   Septic arthritis ([place]) 07/02/2020   Sepsis ([place]) 07/02/2020   Bacterial infection, unspecified 07/09/2020   Fluid overload 08/12/2020   Acute on chronic systolic CHF (congestive heart failure) ([place]) 08/12/2020   Chest pain 08/12/2020   Hypoglycemia 08/12/2020   Abnormal LFTs 08/12/2020   V tach ([place]) 08/12/2020   Leucocytosis 08/12/2020   Thrombocytopenia ([place]) 08/12/2020   Hx of right BKA ([place]) 08/12/2020   Wide-complex tachycardia (HCC)    HFrEF (heart failure with reduced ejection fraction) (HCC)    Macrocytic anemia    Resolved Ambulatory Problems    Diagnosis Date Noted   Anticoagulation goal of INR 2 to 3 06/23/2012   HTN (hypertension) 07/26/2012   HLD (hyperlipidemia) 07/26/2012   Heart valve replaced 09/07/2012   Chronic kidney disease, stage III (moderate) ([place]) 02/01/2013   End-stage renal disease ([place]) 05/20/2011   Contusion of abdominal wall 05/26/2011   Contusion 05/26/2011   Disorder of magnesium metabolism 01/03/2013   Low back pain 09/06/2012   Thoracic or lumbosacral neuritis or  radiculitis 09/06/2012   Malignant neoplasm of kidney ([place]) 03/17/2013   Acidosis 01/03/2013   Intermediate coronary syndrome ([place]) 05/19/2011   Postsurgical percutaneous transluminal coronary angioplasty status 09/07/2012   Sacroiliitis ([place]) 09/06/2012   ETD (eustachian tube dysfunction) 03/17/2013   Fever of unknown origin 07/19/2016   Blood glucose elevated 08/09/2016   Rash 05/17/2017   AKI (acute kidney injury) ([place]) 10/20/2015   Edema 02/22/2013   Visit for biopsy 01/03/2015   ESRD on hemodialysis ([place]) 12/05/2012   Immunosuppression ([place]) 02/22/2013   Incisional hernia, without obstruction or gangrene 01/30/2014   Microhematuria 10/01/2015   Rectal bleeding 11/02/2014   Cut of lower leg 11/17/2017   Abnormal stress test 03/19/2018   URI (upper respiratory infection) 03/19/2018   Muscle strain 03/19/2018   Volume overload 10/20/2015   Hypomagnesemia 01/03/2013   Cervical radicular pain 07/07/2013   Neck pain 07/07/2013   Malignant neoplasm of unspecified kidney, except renal pelvis ([place]) 05/09/2014   Status post percutaneous transluminal coronary angioplasty 05/27/2011   Kidney transplant status 12/15/2012   Transplant recipient 10/01/2015   Hernia of abdominal wall 81/85/6314   Chronic systolic (congestive) heart failure ([place])    Penile pain 08/14/2018   Encounter for therapeutic drug monitoring 09/02/2018   Coronary artery disease of native artery of native heart with stable angina pectoris ([place])  09/13/2018   Coagulation defect, unspecified ([place]) 11/09/2015   Dependence on renal dialysis ([place]) 07/01/2016   Diarrhea, unspecified 11/09/2015   Mild protein-calorie malnutrition ([place]) 04/29/2017   Other abnormal glucose 03/17/2017   Other disorders of electrolyte and fluid balance, not elsewhere classified 03/17/2017   Other disorders resulting from impaired renal tubular function 03/17/2017   Pain in finger of right hand 01/27/2019   Pain, unspecified 11/09/2015    Pruritus, unspecified 11/09/2015   Shortness of breath 11/09/2015   Unspecified jaundice 03/17/2017   COVID-19 03/15/2019   Acute respiratory failure with hypoxia (HCC)    ESRD on dialysis ([place])    Supratherapeutic INR    FUO (fever of unknown origin)    Past Medical History:  Diagnosis Date   Anxiety    Aortic stenosis    Arthritis    Complication of anesthesia    CPAP (continuous positive airway pressure) dependence    Dialysis patient ([place]) 04/07/2006   Heart murmur    History of blood transfusion    Hx of colonoscopy    Hypertension    Ischemic cardiomyopathy    Myocardial infarction (HCC)    Paroxysmal atrial flutter (HCC)    Permanent atrial fibrillation (HCC)    PONV (postoperative nausea and vomiting)    Renal cell carcinoma ([place]) 08/04/2016        Micro Data:  COVID NEG INF A/B NEG  Antimicrobials:   Anti-infectives (From admission, onward)    None           Objective   Blood pressure 127/82, pulse 90, temperature 98.7 F (37.1 C), resp. rate (!) 23, weight 71 kg, SpO2 100 %.    Vent Mode: AC FiO2 (%):  [60 %-80 %] 60 % Set Rate:  [15 bmp-18 bmp] 15 bmp Vt Set:  [450 mL] 450 mL PEEP:  [5 cmH20] 5 cmH20  No intake or output data in the 24 hours ending 08/06/2020 1625 Filed Weights   08/11/2020 1548  Weight: 71 kg      REVIEW OF SYSTEMS  PATIENT IS UNABLE TO PROVIDE COMPLETE REVIEW OF SYSTEMS DUE TO SEVERE CRITICAL ILLNESS AND TOXIC METABOLIC ENCEPHALOPATHY   PHYSICAL EXAMINATION:  GENERAL:critically ill appearing, +resp distress HEAD: Normocephalic, atraumatic.  EYES: Pupils minimally reactive, No scleral icterus.  MOUTH: Moist mucosal membrane. NECK: Supple.  PULMONARY: +rhonchi, +wheezing CARDIOVASCULAR: S1 and S2. Regular rate and rhythm. No murmurs, rubs, or gallops.  GASTROINTESTINAL: Soft, nontender, -distended. Positive bowel sounds.  MUSCULOSKELETAL: RT leg amputee, bleeding from incisionsite from CABG NEUROLOGIC:  obtunded SKIN:intact,warm,dry   Labs/imaging that I havepersonally reviewed  (right click and "Reselect all SmartList Selections" daily)      ASSESSMENT AND PLAN SYNOPSIS  65 yo white male with recent admission for hyperkalemia and WCT now with acute sudden cardiac arrest due to severe acute systolic heart failure and acute ischemic cardiomyopathy with severe acidosis in setting of systolic CHF, ESRD on HD s/p AVR on Coumadin with toxic metabolic encephalopathy     Severe ACUTE Hypoxic and Hypercapnic Respiratory Failure -continue Mechanical Ventilator support -continue Bronchodilator Therapy -Wean Fio2 and PEEP as tolerated -VAP/VENT bundle implementation  CARDIAC FAILURE-acute systolic dysfunction Acute ischemic cardiomyopathy Follow up cardiology recs -oxygen as needed -follow up cardiac enzymes as indicated   CARDIAC ICU monitoring   END STAGE KIDNEY INJURY/Renal Failure -continue Foley Catheter-assess need -Avoid nephrotoxic agents -Follow urine output, BMP -Ensure adequate renal perfusion, optimize oxygenation -Renal dose medications May need CRRT   NEUROLOGY Acute toxic metabolic  encephalopathy, need for sedation Goal RASS -2 to -3 Plan for hypothermia protocol   CARDIOGENIC SHOCK -use vasopressors to keep MAP>65 as needed -follow ABG and LA  ENDO - ICU hypoglycemic\Hyperglycemia protocol -check FSBS per protocol   GI GI PROPHYLAXIS as indicated  NUTRITIONAL STATUS DIET-->NPO Constipation protocol as indicated   ELECTROLYTES -follow labs as needed -replace as needed -pharmacy consultation and following     Best practice (right click and "Reselect all SmartList Selections" daily)  Diet:  NPO Pain/Anxiety/Delirium protocol (if indicated): Yes (RASS goal -2) VAP protocol (if indicated): Yes DVT prophylaxis: Contraindicated GI prophylaxis: PPI Glucose control:  SSI Yes Central venous access:  Yes, and it is still needed Arterial  line:  Yes, and it is still needed Foley:  N/A Mobility:  bed rest  PT consulted: N/A Code Status:  full code Disposition: ICU  Labs   CBC: Recent Labs  Lab 08/12/20 0946 08/13/20 0510 08/15/20 0454 08/16/20 0425 09/04/2020 1520  WBC 13.4* 6.7 5.7 5.6 12.2*  NEUTROABS 5.9  --   --   --  4.6  HGB 12.3* 11.8* 11.0* 10.9* 10.8*  HCT 42.2 38.9* 36.0* 35.2* 37.4*  MCV 108.2* 102.4* 102.6* 101.7* 106.3*  PLT 106* 91* 89* 79* 92*    Basic Metabolic Panel: Recent Labs  Lab 08/13/20 0510 08/14/20 0540 08/15/20 0454 08/16/20 0425 08/27/2020 1520  NA 137 134* 134* 136 139  K 4.5 4.1 4.0 4.1 3.2*  CL 94* 91* 93* 95* 94*  CO2 '29 28 26 25 27  ' GLUCOSE 72 114* 101* 94 136*  BUN 38* 31* 41* 30* 16  CREATININE 5.76* 4.87* 6.26* 5.28* 3.74*  CALCIUM 9.3 8.8* 8.5* 8.6* 8.1*  MG  --   --   --   --  2.1   GFR: Estimated Creatinine Clearance: 20.3 mL/min (A) (by C-G formula based on SCr of 3.74 mg/dL (H)). Recent Labs  Lab 08/13/20 0510 08/15/20 0454 08/16/20 0425 08/10/2020 1520  WBC 6.7 5.7 5.6 12.2*  LATICACIDVEN  --   --   --  8.4*    Liver Function Tests: Recent Labs  Lab 08/12/20 0946 08/15/20 0454 08/16/20 0425 09/01/2020 1520  AST 105* 235* 138* 97*  ALT 96* 291* 217* 153*  ALKPHOS 179* 135* 133* 142*  BILITOT 2.0* 1.4* 1.5* 1.5*  PROT 7.5 7.0 6.9 7.2  ALBUMIN 3.1* 2.9* 2.8* 2.8*   No results for input(s): LIPASE, AMYLASE in the last 168 hours. No results for input(s): AMMONIA in the last 168 hours.  ABG    Component Value Date/Time   PHART 7.22 (L) 08/19/2020 1553   PCO2ART 31 (L) 08/28/2020 1553   PO2ART 52 (L) 08/21/2020 1553   HCO3 12.7 (L) 08/25/2020 1553   TCO2 29 07/13/2020 0851   ACIDBASEDEF 13.8 (H) 08/21/2020 1553   O2SAT 78.1 08/21/2020 1553     Coagulation Profile: Recent Labs  Lab 08/13/20 0510 08/14/20 0540 08/15/20 1505 08/16/20 0425 08/14/2020 1520  INR 4.5* 3.3* 4.1* 4.4* 3.6*    Cardiac Enzymes: No results for input(s): CKTOTAL,  CKMB, CKMBINDEX, TROPONINI in the last 168 hours.  HbA1C: Hgb A1c MFr Bld  Date/Time Value Ref Range Status  08/13/2020 05:10 AM 5.0 4.8 - 5.6 % Final    Comment:    (NOTE) Pre diabetes:          5.7%-6.4%  Diabetes:              >6.4%  Glycemic control for   <7.0% adults with diabetes  09/15/2018 05:55 AM 5.9 (H) 4.8 - 5.6 % Final    Comment:    (NOTE) Pre diabetes:          5.7%-6.4% Diabetes:              >6.4% Glycemic control for   <7.0% adults with diabetes     CBG: Recent Labs  Lab 08/12/20 1244 08/12/20 1300 08/12/20 1627 08/12/20 1915  GLUCAP 23* 143* 109* 102*     Past Medical History:  He,  has a past medical history of Abnormal stress test (03/19/2018), Anemia in chronic kidney disease (07/04/2015), Anxiety, Aortic stenosis, Arthritis, CAD (coronary artery disease), Chronic anticoagulation (10/6224), Complication of anesthesia, COVID-19 (03/15/2019), CPAP (continuous positive airway pressure) dependence, Dialysis patient ([place]) (04/07/2006), ESRD (end stage renal disease) ([place]), Heart murmur, HFrEF (heart failure with reduced ejection fraction) ([place]), History of blood transfusion, colonoscopy, Hyperlipidemia, Hypertension, Hypogonadism in male (07/25/2015), Ischemic cardiomyopathy, Myocardial infarction ([place]), OSA (obstructive sleep apnea), Paroxysmal atrial flutter ([place]), Peripheral vascular disease ([place]), Permanent atrial fibrillation ([place]), PONV (postoperative nausea and vomiting), and Renal cell carcinoma ([place]) (08/04/2016).   Surgical History:   Past Surgical History:  Procedure Laterality Date   ABDOMINAL AORTOGRAM W/LOWER EXTREMITY N/A 12/22/2019   Procedure: ABDOMINAL AORTOGRAM W/LOWER EXTREMITY;  Surgeon: Marty Heck, MD;  Location: [place] [facility];  Service: Cardiovascular;  Laterality: N/A;   AMPUTATION Right 04/14/2020   Procedure: RIGHT BELOW KNEE AMPUTATION;  Surgeon: Marty Heck, MD;  Location: [facility];  Service: Vascular;   Laterality: Right;   AORTIC VALVE REPLACEMENT  3 /11 /2014   At [place] Right 07/13/2020   Procedure: APPLICATION OF WOUND VAC;  Surgeon: Marty Heck, MD;  Location: [place];  Service: Vascular;  Laterality: Right;   BACK SURGERY     CAPD INSERTION N/A 02/19/2017   Procedure: LAPAROSCOPIC INSERTION CONTINUOUS AMBULATORY PERITONEAL DIALYSIS  (CAPD) CATHETER;  Surgeon: Algernon Huxley, MD;  Location: [facility];  Service: General;  Laterality: N/A;   CARDIOVERSION N/A 08/15/2020   Procedure: CARDIOVERSION;  Surgeon: Kate Sable, MD;  Location: [facility];  Service: Cardiovascular;  Laterality: N/A;   CORONARY ANGIOPLASTY WITH STENT PLACEMENT     CORONARY ARTERY BYPASS GRAFT N/A 09/16/2018   Procedure: CORONARY ARTERY BYPASS GRAFT times three      with left internal mammary artery and endoharvest of right leg greater saphenous vein;  Surgeon: Melrose Nakayama, MD;  Location: [facility];  Service: Open Heart Surgery;  Laterality: N/A;   CORONARY STENT INTERVENTION N/A 07/15/2019   Procedure: CORONARY STENT INTERVENTION;  Surgeon: Wellington Hampshire, MD;  Location: [place] [facility];  Service: Cardiovascular;  Laterality: N/A;   DG AV DIALYSIS  SHUNT ACCESS EXIST*L* OR     left arm   EPIGASTRIC HERNIA REPAIR N/A 02/19/2017   Procedure: HERNIA REPAIR EPIGASTRIC ADULT;  Surgeon: Robert Bellow, MD;  Location: [facility];  Service: General;  Laterality: N/A;   EYE SURGERY     bilateral cataract   HERNIA REPAIR     UMBILICAL HERNIA   HERNIA REPAIR  02/19/2017   INNER EAR SURGERY     20 years ago   KIDNEY TRANSPLANT     KNEE ARTHROSCOPY WITH MEDIAL MENISECTOMY Right 07/02/2020   Procedure: KNEE ARTHROSCOPY WITH INCISION AND DRAINAGE;  Surgeon: Leim Fabry, MD;  Location: [facility];  Service: Orthopedics;  Laterality: Right;   LEFT HEART CATH AND CORS/GRAFTS ANGIOGRAPHY N/A  07/15/2019   Procedure: LEFT HEART CATH AND CORS/GRAFTS  ANGIOGRAPHY;  Surgeon: Wellington Hampshire, MD;  Location: [facility];  Service: Cardiovascular;  Laterality: N/A;   LOWER EXTREMITY ANGIOGRAPHY Right 08/25/2019   Procedure: LOWER EXTREMITY ANGIOGRAPHY;  Surgeon: Algernon Huxley, MD;  Location: [facility];  Service: Cardiovascular;  Laterality: Right;   LOWER EXTREMITY ANGIOGRAPHY Left 09/29/2019   Procedure: Lower Extremity Angiography;  Surgeon: Algernon Huxley, MD;  Location: [place];  Service: Cardiovascular;  Laterality: Left;   PERIPHERAL VASCULAR INTERVENTION Right 12/22/2019   Procedure: PERIPHERAL VASCULAR INTERVENTION;  Surgeon: Marty Heck, MD;  Location: [facility];  Service: Cardiovascular;  Laterality: Right;   Peritoneal Dialysis access placement  02/19/2017   PSEUDOANERYSM COMPRESSION N/A 09/29/2019   Procedure: PSEUDOANERYSM COMPRESSION;  Surgeon: Algernon Huxley, MD;  Location: [facility];  Service: Cardiovascular;  Laterality: N/A;   REMOVAL OF A DIALYSIS CATHETER N/A 11/23/2017   Procedure: REMOVAL OF A PERITONEAL DIALYSIS CATHETER;  Surgeon: Algernon Huxley, MD;  Location: [facility];  Service: Vascular;  Laterality: N/A;   RIGHT/LEFT HEART CATH AND CORONARY ANGIOGRAPHY Bilateral 06/07/2018   Procedure: RIGHT/LEFT HEART CATH AND CORONARY ANGIOGRAPHY;  Surgeon: Wellington Hampshire, MD;  Location: [facility];  Service: Cardiovascular;  Laterality: Bilateral;   TEE WITHOUT CARDIOVERSION N/A 07/21/2016   Procedure: TRANSESOPHAGEAL ECHOCARDIOGRAM (TEE);  Surgeon: Wellington Hampshire, MD;  Location: [facility];  Service: Cardiovascular;  Laterality: N/A;   TEE WITHOUT CARDIOVERSION N/A 09/16/2018   Procedure: TRANSESOPHAGEAL ECHOCARDIOGRAM (TEE);  Surgeon: Melrose Nakayama, MD;  Location: [facility];  Service: Open Heart Surgery;  Laterality: N/A;   WOUND DEBRIDEMENT Right 07/13/2020   Procedure: RIGHT BELOW KNEE AMPUTATION DEBRIDEMENT WITH [facility];  Surgeon: Marty Heck,  MD;  Location: [facility];  Service: Vascular;  Laterality: Right;     Social History:   reports that he has never smoked. He has never used smokeless tobacco. He reports previous alcohol use. He reports that he does not use drugs.   Family History:  His family history includes Cancer in his mother; Diabetes in his father; Heart disease in his father.   Allergies Allergies  Allergen Reactions   Statins Other (See Comments)    Arthralgias (intolerance), Myalgias (intolerance)  Pt taking pravastatin    Gabapentin     Drowsy    Sertraline Rash     Home Medications  Prior to Admission medications   Medication Sig Start Date End Date Taking? Authorizing Provider  acetaminophen (TYLENOL) 650 MG CR tablet Take 1,300 mg by mouth every 8 (eight) hours as needed for pain.    [provider]  albuterol (VENTOLIN HFA) 108 (90 Base) MCG/ACT inhaler Inhale 2 puffs into the lungs every 4 (four) hours as needed for wheezing or shortness of breath. 08/16/20   Loletha Grayer, MD  allopurinol (ZYLOPRIM) 100 MG tablet Take 1 tablet (100 mg total) by mouth daily. 05/03/20   Love, Ivan Anchors, PA-C  amiodarone (PACERONE) 200 MG tablet Two tablets po twice a day for one week then one tablet twice a day afterwards 08/16/20   Loletha Grayer, MD  Carbomer Gel Base (HYDROGEL) GEL Apply gel to graft site of right lower extremity 08/07/20   Vaughan Basta, Edman Circle, PA-C  cinacalcet (SENSIPAR) 30 MG tablet TAKE 1 TABLET BY MOUTH DAILY WITH FOOD (DO NOT TAKE LESS THAN 12 HOURS PRIOR TO DIALYSIS) Patient not taking: Reported on  08/12/2020 07/16/20   [provider]  clopidogrel (PLAVIX) 75 MG tablet Take 1 tablet (75 mg total) by mouth daily with breakfast. 07/29/19   Wellington [place], MD  ezetimibe (ZETIA) 10 MG tablet TAKE 1 TABLET BY MOUTH EVERY DAY Patient taking differently: Take 10 mg by mouth daily. 03/19/20   Dunn, Areta Haber, PA-C  hydroxychloroquine (PLAQUENIL) 200 MG tablet Take 400 mg by mouth at bedtime.  09/01/19   [provider]  lidocaine-prilocaine (EMLA) cream Apply 1 application topically daily as needed (for fistula).    [provider]  loperamide (IMODIUM A-D) 2 MG tablet Take 2 mg by mouth 4 (four) times daily as needed for diarrhea or loose stools.    [provider]  loratadine (CLARITIN) 10 MG tablet Take 10 mg by mouth daily.    [provider]  megestrol (MEGACE) 40 MG/ML suspension Take 400 mg by mouth daily. 05/04/20   [provider]  metoprolol succinate (TOPROL-XL) 25 MG 24 hr tablet Take 1 tablet (25 mg total) by mouth daily. 08/06/2020   Loletha Grayer, MD  midodrine (PROAMATINE) 10 MG tablet Take 1 tablet (10 mg total) by mouth 3 (three) times daily with meals. 08/16/20   Loletha Grayer, MD  multivitamin (RENA-VIT) TABS tablet Take 1 tablet by mouth daily. 02/23/19   [provider]  nitroGLYCERIN (NITROSTAT) 0.4 MG SL tablet Place 1 tablet (0.4 mg total) under the tongue every 5 (five) minutes as needed for chest pain. 06/24/19 03/13/20  Loel Dubonnet, NP  oxyCODONE-acetaminophen (PERCOCET/ROXICET) 5-325 MG tablet Take 1 tablet by mouth every 4 (four) hours as needed. 07/13/20   Ulyses Amor, PA-C  pantoprazole (PROTONIX) 40 MG tablet Take 1 tablet (40 mg total) by mouth daily. 07/29/19   Wellington Hampshire, MD  phenylephrine (,USE FOR PREPARATION-H,) 0.25 % suppository Place 1 suppository rectally 2 (two) times daily as needed for hemorrhoids.    [provider]  polyethylene glycol (MIRALAX / GLYCOLAX) 17 g packet Take 17 g by mouth daily as needed for mild constipation. 05/03/20   Love, Ivan Anchors, PA-C  rosuvastatin (CRESTOR) 40 MG tablet TAKE 1 TABLET BY MOUTH DAILY AT 6 PM 08/16/20   Wellington Hampshire, MD  sevelamer carbonate (RENVELA) 800 MG tablet Take 3,200 mg by mouth 3 (three) times daily with meals.    [provider]  traZODone (DESYREL) 50 MG tablet TAKE 1/2 -1 TABLET (25-50 MG TOTAL) BY MOUTH AT  BEDTIME AS NEEDED FOR SLEEP. Patient taking differently: Take 50 mg by mouth at bedtime as needed. 11/10/19   Leone [place], MD  warfarin (COUMADIN) 1 MG tablet Take 1 tablet (1 mg total) by mouth every other day. 08/16/20   Loletha Grayer, MD       DVT/GI PRX  assessed I Assessed the need for Labs I Assessed the need for Foley I Assessed the need for Central Venous Line Family Discussion when available I Assessed the need for Mobilization I made an Assessment of medications to be adjusted accordingly Safety Risk assessment completed  CASE DISCUSSED IN MULTIDISCIPLINARY ROUNDS WITH ICU TEAM     Critical Care Time devoted to patient care services described in this note is 75 minutes.  Critical care was necessary to treat /prevent imminent and life-threatening deterioration. Overall, patient is critically ill, prognosis is guarded.  Patient with Multiorgan failure and at high risk for cardiac arrest and death.    Corrin Parker, M.D.  [facility]  [facility]  Medical Director [place] Director [facility] Cardio-Pulmonary Department

## 2020-08-17 NOTE — Procedures (Signed)
Central Venous Dailysis Catheter Placement: TRIPLE LUMEN    Indication(s) Hemo Dialysis/CRRT  Consent EMERGENT Risks of the procedure as well as the alternatives and risks of each were explained to the patient and/or caregiver.  Consent for the procedure was obtained and is signed in the bedside chart  Consent:emergent   Anesthesia Topical only with 1% lidocaine    Timeout Verified patient identification, verified procedure, site/side was marked, verified correct patient position, special equipment/implants available, medications/allergies/relevant history reviewed, required imaging and test results available. Patient comfort was obtained.     Sterile Technique Maximal sterile technique including full sterile barrier drape, hand hygiene, sterile gown, sterile gloves, mask, hair covering, sterile ultrasound probe cover (if used).   Hand washing performed prior to starting the procedure.    Procedure Description Area of catheter insertion was cleaned with chlorhexidine and draped in sterile fashion.  With real-time ultrasound guidance a central venous catheter was placed into the vein. Nonpulsatile blood flow and easy flushing noted in all ports.  The catheter was sutured in place and sterile dressing applied.   A triple lumen catheter was placed in RT femoral  Vein There was good blood return, catheter caps were placed on lumens, catheter flushed easily, the line was secured and a sterile dressing and BIO-PATCH applied.   Ultrasound was used to visualize vasculature and guidance of needle.   Number of Attempts: 1 Complications:none  Estimated Blood Loss: none Operator: Mylena Sedberry/[place].   Corrin Parker, M.D.  [facility]  Medical Director [place] Director [facility] Cardio-Pulmonary Department

## 2020-08-17 NOTE — Progress Notes (Signed)
Responded to ED for [facility] page. Patient being cared for by medical team, I checked for family presence, none had arrived. I will follow up with family in ICU.

## 2020-08-17 NOTE — ED Provider Notes (Signed)
[facility] Emergency Department Provider Note ____________________________________________   Event Date/Time   First MD Initiated Contact with Patient 08/16/2020 1532     (approximate)  I have reviewed the triage vital signs and the nursing notes.  HISTORY  Chief Complaint post cardiac arrest   HPI Jeremy [name] is a 65 y.o. malewho presents to the ED for evaluation of postcardiac arrest care.  Chart review indicates patient was just discharged yesterday afternoon after a 4-day hospital stay due to hyperkalemia, wide-complex tachycardia and volume overload.  I saw the patient in the ED then and recall him distinctly. History ESRD, CAD s/p stenting, mechanical AVR on Coumadin, reduced ejection fraction and chronic A. fib.  Patient reportedly was dialyzed earlier today, and then family found him down and pulseless this afternoon.  Family started CPR, then fire, then EMS reports an additional 10 minutes of CPR.  Sounds like 15-20 minutes of total downtime.  Initial rhythm was PEA.  No shocks provided.  2 rounds of epi and King airway was placed in the field.  History otherwise limited due to postarrest care and acuity of condition.  Past Medical History:  Diagnosis Date  . Abnormal stress test 03/19/2018  . 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 Anxiety    situation  . Aortic stenosis    a. 2014 s/p mech AVR ([facility])-->chronic coumadin; b. 08/2018 Echo: Mild AS/AI. Nl fxn/gradient; c. 09/2019 Echo: EF 45-50%, AoV mean grad 72mHg.  . Arthritis   . CAD (coronary artery disease)    a. 2013 PCI: LAD [id number] Xience DES); b. 06/2018 Cath: LM min irregs, LAD 60p, 95/30/53m, LCX min irregs, RCA 40p, 70103m0d, RPAV 90, RPDA 95ost; d. 09/2018 CABGx3 (LIMA->LAD, VG->D1, VG->PDA); e. 07/2019 PCI: LAD 90p, 94m38mm 82m 53m, 16mills via L->L collats, LCX mild diff dzs, RCA 40p, 55m, 953mRPAV 99, RPDA 95ost, VG->D2 100, LIMA->LAD ok, VG->RPDA 99 (4.5x15 Resolute Onyx  DES).  . Chronic anticoagulation 06/2012   a. Coumadin in the setting of mech AVR and afib.  . Complication of anesthesia   . COVID-19 03/15/2019  . CPAP (continuous positive airway pressure) dependence   . Dialysis patient (HCC) 0[facility]/2008   Patient started HD around 1996,  had 1st transplant LLQ around 1998 until 2007 when they found renal cell cancer in transplant and both native kidneys, all were removed > went back on HD until 2nd transplant RLQ in 2014 which lasted 3 yrs; it was embolized for suspected acute rejection. [facility]  . ESRD (end stage renal disease) (HCC)   [place] CKD 2/2 to IgA nephropathy (dx age 89); b.[id number] s/p Kidney transplant; c. 2014 Bilat nephrectomy (transplanted kidney resected) in the setting of renal cell carcinoma; d. PD until 11/2017-->HD.  . HeartMarland Kitchenmurmur   . HFrEF (heart failure with reduced ejection fraction) (HCC)   [place] 07/2016 Echo: EF 55-60%; b. 02/2018 MV: EF 30%; c. 04/2018 Echo: EF 35%; d. 08/2018 Echo: EF 30-35%. Glob HK w/o rwma; e. 09/2019 Echo: EF 45-50%, mod LVH, basal-mid inf/inflat HK, nl RV fxn, mod dil LA, mildly dil RA. Nl AoV prosthesis (mean grad 11mmHg)75m History of blood transfusion   . Hx of colonoscopy    2009 by Dr. Rehman. Laural Golden except for small submucosal lipoma. No evidence of polyps or colitis.  . Hyperlipidemia   . Hypertension   . Hypogonadism in male 07/25/2015  .  Ischemic cardiomyopathy    a. 07/2016 Echo: EF 55-60%; b. 02/2018 MV: EF 30%; c. 04/2018 Echo: EF 35%; d. 08/2018 Echo: EF 30-35%; e.  09/2019 Echo: EF 45-50%, mod LVH, basal-mid inf/inflat HK.  . Myocardial infarction ([place])   . OSA (obstructive sleep apnea)    No CPAP  . Paroxysmal atrial flutter ([place])   . Peripheral vascular disease ([place])    a. 09/2019 s/p L SFA and L profunda stenting; b. 12/2019 s/p R PT and TP trunk PTA; c. 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  Marland Kitchen Permanent atrial fibrillation (HCC)    a. CHA2DS2VASc = 3-->Coumadin (also mech  AVR); b. 06/2018 Amio started 2/2 elevated rates.  Marland Kitchen PONV (postoperative nausea and vomiting)   . Renal cell carcinoma ([place]) 08/04/2016    Patient Active Problem List   Diagnosis Date Noted  . HFrEF (heart failure with reduced ejection fraction) ([place])   . Macrocytic anemia   . Wide-complex tachycardia ([place])   . Fluid overload 08/12/2020  . Acute on chronic systolic CHF (congestive heart failure) ([place]) 08/12/2020  . Chest pain 08/12/2020  . Hypoglycemia 08/12/2020  . Abnormal LFTs 08/12/2020  . V tach ([place]) 08/12/2020  . Leucocytosis 08/12/2020  . Thrombocytopenia ([place]) 08/12/2020  . Hx of right BKA ([place]) 08/12/2020  . Bacterial infection, unspecified 07/09/2020  . Septic arthritis ([place]) 07/02/2020  . Sepsis ([place]) 07/02/2020  . Hypercalcemia 06/12/2020  . Weight loss 05/31/2020  . Bronchitis 05/24/2020  . Postoperative pain   . Rheumatoid arthritis ([place])   . Anemia in ESRD (end-stage renal disease) ([place])   . Macrocytosis   . Below-knee amputation of left lower extremity ([place]) 04/23/2020  . Critical lower limb ischemia ([place]) 04/14/2020  . Ischemic neuropathy of right foot 03/20/2020  . Femoral neuropathy of left lower extremity 03/20/2020  . Peripheral vascular disease ([place]) 12/26/2019  . PAD (peripheral artery disease) ([place]) 12/21/2019  . ILD (interstitial lung disease) ([place]) 12/06/2019  . Cough 12/06/2019  . Fatigue 10/19/2019  . Difficulty sleeping 10/19/2019  . Contusion of unspecified thigh, initial encounter 10/11/2019  . Other acute postprocedural pain 10/11/2019  . Demand ischemia ([place])   . Atrial flutter ([place])   . Non-ST elevation (NSTEMI) myocardial infarction ([place])   . Elevated troponin 10/01/2019  . Thigh hematoma 10/01/2019  . Postprocedural hypotension   . Hematoma of thigh, left, initial encounter 09/29/2019  . Unstable angina ([place]) 07/13/2019  . Atherosclerosis of native arteries of the extremities with ulceration ([place]) 07/05/2019  . Foot ulcer ([place])  05/27/2019  . Skin lesion 11/17/2018  . Complication of vascular access for dialysis 10/01/2018  . S/P CABG x 3 09/16/2018  . Hydrocele 08/16/2018  . IBS (irritable bowel syndrome) 03/19/2018  . Depression 05/17/2017  . AV fistula ([place]) 05/17/2017  . Chronic pain of both knees 05/17/2017  . Arthralgia 05/17/2017  . Osteoarthritis 03/17/2017  . Trigger little finger of right hand 03/17/2017  . Hyperkalemia 03/17/2017  . Liver disease, unspecified 03/17/2017  . 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 SVT (supraventricular tachycardia) ([place]) 10/30/2016  . Ventral hernia 10/14/2016  . History of renal cell carcinoma 08/04/2016  . Renal transplant failure and rejection 07/31/2016  . Fever 07/17/2016  . Hypogonadism in male 07/25/2015  . 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 Gout 04/24/2015  . Proteinuria 01/03/2015  . Chronic anticoagulation 11/06/2014  . Iron deficiency anemia 11/02/2014  . Epigastric hernia 02/08/2014  . 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[place]) 08/16/2013  . Vitamin D deficiency 08/16/2013  .  Spinal stenosis 06/20/2013  . OSA (obstructive sleep apnea)   . Tremor 12/30/2012  . History of kidney transplant 12/15/2012  . Immunosuppressive management encounter following kidney transplant 12/15/2012  . S/P angioplasty with stent 09/07/2012  . H/O mitral valve replacement with mechanical valve 09/07/2012  . Lumbar radiculopathy 09/06/2012  . ESRD (end stage renal disease) ([place]) 07/26/2012  . Hyperlipidemia 07/26/2012  . S/P AVR (aortic valve replacement) 06/23/2012  . Aortic valve disorder 06/14/2012  . Heart failure ([place]) 05/30/2012  . Thyroid nodule 06/26/2011  . CAD (coronary artery disease) 05/26/2011  . Essential hypertension 05/19/2011    Past Surgical History:  Procedure Laterality Date  . ABDOMINAL AORTOGRAM W/LOWER EXTREMITY N/A 12/22/2019   Procedure: ABDOMINAL AORTOGRAM W/LOWER EXTREMITY;  Surgeon: Marty Heck, MD;   Location: [place] [facility];  Service: Cardiovascular;  Laterality: N/A;  . AMPUTATION Right 04/14/2020   Procedure: RIGHT BELOW KNEE AMPUTATION;  Surgeon: Marty Heck, MD;  Location: [place];  Service: Vascular;  Laterality: Right;  . AORTIC VALVE REPLACEMENT  3 /11 /2014   At [place] Right 07/13/2020   Procedure: APPLICATION OF WOUND VAC;  Surgeon: Marty Heck, MD;  Location: [place];  Service: Vascular;  Laterality: Right;  . BACK SURGERY    . CAPD INSERTION N/A 02/19/2017   Procedure: LAPAROSCOPIC INSERTION CONTINUOUS AMBULATORY PERITONEAL DIALYSIS  (CAPD) CATHETER;  Surgeon: Algernon Huxley, MD;  Location: [facility];  Service: General;  Laterality: N/A;  . CARDIOVERSION N/A 08/15/2020   Procedure: CARDIOVERSION;  Surgeon: Kate Sable, MD;  Location: [facility];  Service: Cardiovascular;  Laterality: N/A;  . CORONARY ANGIOPLASTY WITH STENT PLACEMENT    . CORONARY ARTERY BYPASS GRAFT N/A 09/16/2018   Procedure: CORONARY ARTERY BYPASS GRAFT times three      with left internal mammary artery and endoharvest of right leg greater saphenous vein;  Surgeon: Melrose Nakayama, MD;  Location: [place];  Service: Open Heart Surgery;  Laterality: N/A;  . CORONARY STENT INTERVENTION N/A 07/15/2019   Procedure: CORONARY STENT INTERVENTION;  Surgeon: Wellington Hampshire, MD;  Location: [place] [facility];  Service: Cardiovascular;  Laterality: N/A;  . DG AV DIALYSIS  SHUNT ACCESS EXIST*L* OR     left arm  . EPIGASTRIC HERNIA REPAIR N/A 02/19/2017   Procedure: HERNIA REPAIR EPIGASTRIC ADULT;  Surgeon: Robert Bellow, MD;  Location: [facility];  Service: General;  Laterality: N/A;  . EYE SURGERY     bilateral cataract  . HERNIA REPAIR     UMBILICAL HERNIA  . HERNIA REPAIR  02/19/2017  . INNER EAR SURGERY     20 years ago  . KIDNEY TRANSPLANT    . KNEE ARTHROSCOPY WITH MEDIAL MENISECTOMY Right 07/02/2020   Procedure: KNEE  ARTHROSCOPY WITH INCISION AND DRAINAGE;  Surgeon: Leim Fabry, MD;  Location: [facility];  Service: Orthopedics;  Laterality: Right;  . LEFT HEART CATH AND CORS/GRAFTS ANGIOGRAPHY N/A 07/15/2019   Procedure: LEFT HEART CATH AND CORS/GRAFTS ANGIOGRAPHY;  Surgeon: Wellington Hampshire, MD;  Location: [place] [facility];  Service: Cardiovascular;  Laterality: N/A;  . LOWER EXTREMITY ANGIOGRAPHY Right 08/25/2019   Procedure: LOWER EXTREMITY ANGIOGRAPHY;  Surgeon: Algernon Huxley, MD;  Location: [place] [facility];  Service: Cardiovascular;  Laterality: Right;  . LOWER EXTREMITY ANGIOGRAPHY Left 09/29/2019   Procedure: Lower Extremity Angiography;  Surgeon: Algernon Huxley, MD;  Location: [place] [facility];  Service: Cardiovascular;  Laterality: Left;  . PERIPHERAL VASCULAR INTERVENTION Right 12/22/2019   Procedure: PERIPHERAL VASCULAR INTERVENTION;  Surgeon: Marty Heck, MD;  Location: [place] [facility];  Service: Cardiovascular;  Laterality: Right;  . Peritoneal Dialysis access placement  02/19/2017  . PSEUDOANERYSM COMPRESSION N/A 09/29/2019   Procedure: PSEUDOANERYSM COMPRESSION;  Surgeon: Algernon Huxley, MD;  Location: [place] [facility];  Service: Cardiovascular;  Laterality: N/A;  . REMOVAL OF A DIALYSIS CATHETER N/A 11/23/2017   Procedure: REMOVAL OF A PERITONEAL DIALYSIS CATHETER;  Surgeon: Algernon Huxley, MD;  Location: [facility];  Service: Vascular;  Laterality: N/A;  . RIGHT/LEFT HEART CATH AND CORONARY ANGIOGRAPHY Bilateral 06/07/2018   Procedure: RIGHT/LEFT HEART CATH AND CORONARY ANGIOGRAPHY;  Surgeon: Wellington Hampshire, MD;  Location: [place] [facility];  Service: Cardiovascular;  Laterality: Bilateral;  . TEE WITHOUT CARDIOVERSION N/A 07/21/2016   Procedure: TRANSESOPHAGEAL ECHOCARDIOGRAM (TEE);  Surgeon: Wellington Hampshire, MD;  Location: [facility];  Service: Cardiovascular;  Laterality: N/A;  . TEE WITHOUT CARDIOVERSION N/A 09/16/2018   Procedure: TRANSESOPHAGEAL ECHOCARDIOGRAM (TEE);   Surgeon: Melrose Nakayama, MD;  Location: [place];  Service: Open Heart Surgery;  Laterality: N/A;  . WOUND DEBRIDEMENT Right 07/13/2020   Procedure: RIGHT BELOW KNEE AMPUTATION DEBRIDEMENT WITH [facility] SKIN SUB[facility];  Surgeon: Marty Heck, MD;  Location: [place];  Service: Vascular;  Laterality: Right;    Prior to Admission medications   Medication Sig Start Date End Date Taking? Authorizing Provider  acetaminophen (TYLENOL) 650 MG CR tablet Take 1,300 mg by mouth every 8 (eight) hours as needed for pain.    [provider]  albuterol (VENTOLIN HFA) 108 (90 Base) MCG/ACT inhaler Inhale 2 puffs into the lungs every 4 (four) hours as needed for wheezing or shortness of breath. 08/16/20   Loletha Grayer, MD  allopurinol (ZYLOPRIM) 100 MG tablet Take 1 tablet (100 mg total) by mouth daily. 05/03/20   Love, Ivan Anchors, PA-C  amiodarone (PACERONE) 200 MG tablet Two tablets po twice a day for one week then one tablet twice a day afterwards 08/16/20   Loletha Grayer, MD  Carbomer Gel Base (HYDROGEL) GEL Apply gel to graft site of right lower extremity 08/07/20   Setzer, Edman Circle, PA-C  cinacalcet (SENSIPAR) 30 MG tablet TAKE 1 TABLET BY MOUTH DAILY WITH FOOD (DO NOT TAKE LESS THAN 12 HOURS PRIOR TO DIALYSIS) Patient not taking: Reported on 08/12/2020 07/16/20   [provider]  clopidogrel (PLAVIX) 75 MG tablet Take 1 tablet (75 mg total) by mouth daily with breakfast. 07/29/19   Wellington Hampshire, MD  ezetimibe (ZETIA) 10 MG tablet TAKE 1 TABLET BY MOUTH EVERY DAY Patient taking differently: Take 10 mg by mouth daily. 03/19/20   Dunn, Areta Haber, PA-C  hydroxychloroquine (PLAQUENIL) 200 MG tablet Take 400 mg by mouth at bedtime. 09/01/19   [provider]  lidocaine-prilocaine (EMLA) cream Apply 1 application topically daily as needed (for fistula).    [provider]  loperamide (IMODIUM A-D) 2 MG tablet Take 2 mg by mouth 4 (four) times daily as needed for diarrhea or  loose stools.    [provider]  loratadine (CLARITIN) 10 MG tablet Take 10 mg by mouth daily.    [provider]  megestrol (MEGACE) 40 MG/ML suspension Take 400 mg by mouth daily. 05/04/20   [provider]  metoprolol succinate (TOPROL-XL) 25 MG 24 hr tablet Take 1 tablet (25 mg total) by mouth daily. 09/02/2020   [place],  Richard, MD  midodrine (PROAMATINE) 10 MG tablet Take 1 tablet (10 mg total) by mouth 3 (three) times daily with meals. 08/16/20   Loletha Grayer, MD  multivitamin (RENA-VIT) TABS tablet Take 1 tablet by mouth daily. 02/23/19   [provider]  nitroGLYCERIN (NITROSTAT) 0.4 MG SL tablet Place 1 tablet (0.4 mg total) under the tongue every 5 (five) minutes as needed for chest pain. 06/24/19 03/13/20  Loel Dubonnet, NP  oxyCODONE-acetaminophen (PERCOCET/ROXICET) 5-325 MG tablet Take 1 tablet by mouth every 4 (four) hours as needed. 07/13/20   Ulyses Amor, PA-C  pantoprazole (PROTONIX) 40 MG tablet Take 1 tablet (40 mg total) by mouth daily. 07/29/19   Wellington Hampshire, MD  phenylephrine (,USE FOR PREPARATION-H,) 0.25 % suppository Place 1 suppository rectally 2 (two) times daily as needed for hemorrhoids.    [provider]  polyethylene glycol (MIRALAX / GLYCOLAX) 17 g packet Take 17 g by mouth daily as needed for mild constipation. 05/03/20   Love, Ivan Anchors, PA-C  rosuvastatin (CRESTOR) 40 MG tablet TAKE 1 TABLET BY MOUTH DAILY AT 6 PM 08/16/20   Wellington Hampshire, MD  sevelamer carbonate (RENVELA) 800 MG tablet Take 3,200 mg by mouth 3 (three) times daily with meals.    [provider]  traZODone (DESYREL) 50 MG tablet TAKE 1/2 -1 TABLET (25-50 MG TOTAL) BY MOUTH AT BEDTIME AS NEEDED FOR SLEEP. Patient taking differently: Take 50 mg by mouth at bedtime as needed. 11/10/19   Leone Haven, MD  warfarin (COUMADIN) 1 MG tablet Take 1 tablet (1 mg total) by mouth every other day. 08/16/20   Loletha Grayer, MD     Allergies Statins, Gabapentin, and Sertraline  Family History  Problem Relation Age of Onset  . Cancer Mother        pancreatic  . Heart disease Father   . Diabetes Father     Social History Social History   Tobacco Use  . Smoking status: Never Smoker  . Smokeless tobacco: Never Used  Vaping Use  . Vaping Use: Never used  Substance Use Topics  . Alcohol use: Not Currently  . Drug use: No    Review of Systems  Unable to be accurately assessed due to postarrest patient ____________________________________________   PHYSICAL EXAM:  VITAL SIGNS: Vitals:   08/26/2020 1520 08/29/2020 1536  BP: 107/82 (!) 89/73  Pulse:  98  Resp:  20  SpO2:  100%     Constitutional: Supine on the backboard, taking occasional independent respirations with a slow rate.  Does not follow commands or respond to stimuli. Eyes: Conjunctivae are normal . pupils are small and symmetric bilaterally... Head: Atraumatic. Nose: No congestion/rhinnorhea. Mouth/Throat: Mucous membranes are moist.  Oropharynx non-erythematous. Neck: No stridor. No cervical spine tenderness to palpation. Cardiovascular: Normal rate, regular rhythm.  Palpable bilateral carotid pulses on presentation Respiratory: Normal respiratory effort.  No retractions. Lungs CTAB. Gastrointestinal: Soft , nondistended Musculoskeletal: S/p right-sided BKA. Neurologic: GCS 3 on backboard, prior to intubation.  No rigidity Skin:  Skin is warm, dry and intact. No rash noted. Psychiatric: Mood and affect are unable to be assessed  ____________________________________________   LABS (all labs ordered are listed, but only abnormal results are displayed)  Labs Reviewed  PROTIME-INR - Abnormal; Notable for the following components:      Result Value   Prothrombin Time 36.2 (*)    INR 3.6 (*)    All other components within normal limits  APTT - Abnormal;  Notable for the following components:   aPTT 38 (*)    All other components  within normal limits  RESP PANEL BY RT-PCR (FLU A&B, COVID) ARPGX2  CBC WITH DIFFERENTIAL/PLATELET  COMPREHENSIVE METABOLIC PANEL  LACTIC ACID, PLASMA  LACTIC ACID, PLASMA  MAGNESIUM  TROPONIN I (HIGH SENSITIVITY)   ____________________________________________  12 Lead EKG  Sinus rhythm, rate of 95 bpm.  Normal axis, right bundle branch block.  Septal and lateral ST depressions without STEMI criteria. ____________________________________________  RADIOLOGY  ED MD interpretation: 1 view CXR reviewed by me with ETT and OGT in good position.  No PTX.  Official radiology report(s): DG Chest Portable 1 View  Result Date: 08/25/2020 CLINICAL DATA:  Hypoxia EXAM: PORTABLE CHEST 1 VIEW COMPARISON:  Aug 12, 2020 FINDINGS: Endotracheal tube tip is 3.1 cm above the carina. Nasogastric tube side port seen in stomach with tip below diaphragm, not seen. No pneumothorax. There is cardiomegaly with pulmonary venous hypertension. There is interstitial prominence, likely due to a degree of interstitial pulmonary edema. No airspace opacity. No pleural effusions evident. Patient is status post coronary artery bypass grafting. There is aortic atherosclerosis. No evident adenopathy. Old healed fracture right posterior sixth rib. IMPRESSION: Tube positions as described without pneumothorax. Cardiomegaly with pulmonary vascular congestion. Suspect mild interstitial edema. No consolidation. Status post coronary artery bypass grafting. Aortic Atherosclerosis (ICD10-I70.0). Electronically Signed   By: Lowella Grip III M.D.   On: 09/03/2020 15:44    ____________________________________________   PROCEDURES and INTERVENTIONS  Procedure(s) performed (including Critical Care):  .1-3 Lead EKG Interpretation Performed by: Vladimir Crofts, MD Authorized by: Vladimir Crofts, MD     Interpretation: normal     ECG rate:  96   ECG rate assessment: normal     Rhythm: sinus rhythm     Ectopy: none     Conduction:  normal   .Critical Care Performed by: Vladimir Crofts, MD Authorized by: Vladimir Crofts, MD   Critical care provider statement:    Critical care time (minutes):  35   Critical care was necessary to treat or prevent imminent or life-threatening deterioration of the following conditions:  Cardiac failure and respiratory failure   Critical care was time spent personally by me on the following activities:  Discussions with consultants, evaluation of patient's response to treatment, examination of patient, ordering and performing treatments and interventions, ordering and review of laboratory studies, ordering and review of radiographic studies, pulse oximetry, re-evaluation of patient's condition, obtaining history from patient or surrogate and review of old charts Procedure Name: Intubation Date/Time: 09/02/2020 3:50 PM Performed by: Vladimir Crofts, MD Pre-anesthesia Checklist: Patient identified, Patient being monitored, Emergency Drugs available, Timeout performed and Suction available Oxygen Delivery Method: Non-rebreather mask Preoxygenation: Pre-oxygenation with 100% oxygen Induction Type: Rapid sequence Ventilation: Mask ventilation without difficulty Laryngoscope Size: Glidescope and 3 Tube size: 7.5 mm Number of attempts: 1 Airway Equipment and Method: Stylet Placement Confirmation: ETT inserted through vocal cords under direct vision,  CO2 detector and Breath sounds checked- equal and bilateral Secured at: 26 cm Tube secured with: ETT holder Comments: Uncomplicated       Medications  etomidate (AMIDATE) injection (10 mg Intravenous Given 08/21/2020 1525)  rocuronium (ZEMURON) injection (100 mg Intravenous Given 08/24/2020 1525)  fentaNYL 2541mg in NS 2565m(1079mml) infusion-PREMIX (has no administration in time range)  fentaNYL (SUBLIMAZE) bolus via infusion 100 mcg (has no administration in time range)    ____________________________________________   MDM / ED COURSE    [age]ar old male with multiple  medical comorbidities presents to the ED for postarrest care after being found down in PEA arrest.  He required about 20 minutes prehospital prior to [place], but remains hemodynamically stable for [name] throughout his stay in the ED.  King airway exchanged for ETT.  EKG with some septal and lateral depressions, but no STEMI.  First troponin is in the 200s, and we will trend these, and this is likely due to his extensive CPR in the field.  We will hold off on heparin drip at this time considering his therapeutic INR.  We will CT head to assess for central nervous pathology contributing to his syncope and collapse at home.  We will admit to ICU for further work-up and management.  Clinical Course as of 09/02/2020 1622  Fri Aug 17, 2020  1533 Dr. [name] came by the ED to see the patient shortly after intubation.  Agrees no STEMI.  Recommends ICU admit for postarrest care.  Cardiology will consult. [DS]  65 Spoke with Dr. Mortimer Fries, ICU, who agrees to see patient for admission [DS]  1554 Reassessed.  Remains a stable.  Midlevel from the ICU was here to assess the patient. [DS]    Clinical Course User Index [DS] Vladimir Crofts, MD    ____________________________________________   FINAL CLINICAL IMPRESSION(S) / ED DIAGNOSES  Final diagnoses:  Cardiac arrest [facility])     ED Discharge Orders    None       Mishon Blubaugh   Note:  This document was prepared using Dragon voice recognition software and may include unintentional dictation errors.   Vladimir Crofts, MD 08/08/2020 [phone]

## 2020-08-17 NOTE — ED Triage Notes (Signed)
Pt to ED via [facility], pt post cardiac arrest. Pt was found on the toilet by family and CPR was started. Family performed 5 minutes of CPR prior to fire department arrival. When EMS arrived pt was found to be in PEA rhythm. EMS performed another 10-12 minutes of CPR. Pt was give 2 rounds of epi and 1 bicarb. CODE STEMI called by EMS in the field prior to arrival.   Pt arrived to the ED with Kind airway in place, pt being assisted with respirations by BVM. Pt in NSR with BBB. Pt had IO in the left tibia. EDP and RT at bedside

## 2020-08-17 NOTE — Progress Notes (Signed)
[facility]  ROUNDING NOTE   Subjective:   Jeremy [name] was admitted to [facility] on 08/14/2020 for Cardiac arrest [facility]) [I46.9]  Last hemodialysis treatment was this morning. Patient tolerated his dialysis treatment well. Went home and family found him down with PEA. Family started CPR and transferred to the ED.   Patient's daughter, who is a respiratory therapist, is at bedside.   Patient was discharged yesterday after being admitted with hyperkalemia, pulmonary edema, volume overload, atrial fibrillation with RVR. He required emergent hemodialysis treatment on last admission. Patient underwent cardioversion on 5/11.   Patient is now intubated and going to be started on hypothermic protocol.   On last admission, patient's midodrine was increased to TID.    Objective:  Vital signs in last 24 hours:  Temp:  [98.6 F (37 C)-98.7 F (37.1 C)] 98.7 F (37.1 C) (05/13 1630) Pulse Rate:  [60-98] 89 (05/13 1630) Resp:  [20-23] 21 (05/13 1630) BP: (89-130)/(72-95) 125/95 (05/13 1630) SpO2:  [98 %-100 %] 100 % (05/13 1630) FiO2 (%):  [60 %-80 %] 60 % (05/13 1605) Weight:  [71 kg] 71 kg (05/13 1548)  Weight change:  Filed Weights   08/25/2020 1548  Weight: 71 kg    Intake/Output: No intake/output data recorded.   Intake/Output this shift:  No intake/output data recorded.  Physical Exam: General: Critically ill  Head: ETT   Eyes: Sluggish pupils  Lungs:  Diminished bilaterally, Assist Control FiO2 80%  Heart: Irregular, +murmur  Abdomen:  Soft   Extremities:  no edema, Right BKA with clean dressings.   Neurologic: Intubated and sedated  Skin: No lesions  Access: Left AVF    Basic Metabolic Panel: Recent Labs  Lab 08/13/20 0510 08/14/20 0540 08/15/20 0454 08/16/20 0425 08/23/2020 1520  NA 137 134* 134* 136 139  K 4.5 4.1 4.0 4.1 3.2*  CL 94* 91* 93* 95* 94*  CO2 29 28 26 25 27   GLUCOSE 72 114* 101* 94 136*  BUN 38* 31* 41* 30* 16  CREATININE 5.76*  4.87* 6.26* 5.28* 3.74*  CALCIUM 9.3 8.8* 8.5* 8.6* 8.1*  MG  --   --   --   --  2.1    Liver Function Tests: Recent Labs  Lab 08/12/20 0946 08/15/20 0454 08/16/20 0425 08/24/2020 1520  AST 105* 235* 138* 97*  ALT 96* 291* 217* 153*  ALKPHOS 179* 135* 133* 142*  BILITOT 2.0* 1.4* 1.5* 1.5*  PROT 7.5 7.0 6.9 7.2  ALBUMIN 3.1* 2.9* 2.8* 2.8*   No results for input(s): LIPASE, AMYLASE in the last 168 hours. No results for input(s): AMMONIA in the last 168 hours.  CBC: Recent Labs  Lab 08/12/20 0946 08/13/20 0510 08/15/20 0454 08/16/20 0425 09/04/2020 1520  WBC 13.4* 6.7 5.7 5.6 12.2*  NEUTROABS 5.9  --   --   --  4.6  HGB 12.3* 11.8* 11.0* 10.9* 10.8*  HCT 42.2 38.9* 36.0* 35.2* 37.4*  MCV 108.2* 102.4* 102.6* 101.7* 106.3*  PLT 106* 91* 89* 79* 92*    Cardiac Enzymes: No results for input(s): CKTOTAL, CKMB, CKMBINDEX, TROPONINI in the last 168 hours.  BNP: Invalid input(s): POCBNP  CBG: Recent Labs  Lab 08/12/20 1244 08/12/20 1300 08/12/20 1627 08/12/20 1915  GLUCAP 23* 143* 109* 102*    Microbiology: Results for orders placed or performed during the hospital encounter of 08/25/2020  Resp Panel by RT-PCR (Flu A&B, Covid) Nasopharyngeal Swab     Status: None   Collection Time: 08/08/2020  3:34  PM   Specimen: Nasopharyngeal Swab; Nasopharyngeal(NP) swabs in vial transport medium  Result Value Ref Range Status   SARS Coronavirus 2 by RT PCR NEGATIVE NEGATIVE Final    Comment: (NOTE) SARS-CoV-2 target nucleic acids are NOT DETECTED.  The SARS-CoV-2 RNA is generally detectable in upper respiratory specimens during the acute phase of infection. The lowest concentration of SARS-CoV-2 viral copies this assay can detect is 138 copies/mL. A negative result does not preclude SARS-Cov-2 infection and should not be used as the sole basis for treatment or other patient management decisions. A negative result may occur with  improper specimen collection/handling,  submission of specimen other than nasopharyngeal swab, presence of viral mutation(s) within the areas targeted by this assay, and inadequate number of viral copies(<138 copies/mL). A negative result must be combined with clinical observations, patient history, and epidemiological information. The expected result is Negative.  Fact Sheet for Patients:  EntrepreneurPulse.com.au  Fact Sheet for Healthcare Providers:  IncredibleEmployment.be  This test is no t yet approved or cleared by the [place] FDA and  has been authorized for detection and/or diagnosis of SARS-CoV-2 by FDA under an Emergency Use Authorization (EUA). This EUA will remain  in effect (meaning this test can be used) for the duration of the COVID-19 declaration under Section 564(b)(1) of the Act, 21 U.S.C.section 360bbb-3(b)(1), unless the authorization is terminated  or revoked sooner.       Influenza A by PCR NEGATIVE NEGATIVE Final   Influenza B by PCR NEGATIVE NEGATIVE Final    Comment: (NOTE) The Xpert Xpress SARS-CoV-2/FLU/RSV plus assay is intended as an aid in the diagnosis of influenza from Nasopharyngeal swab specimens and should not be used as a sole basis for treatment. Nasal washings and aspirates are unacceptable for Xpert Xpress SARS-CoV-2/FLU/RSV testing.  Fact Sheet for Patients: EntrepreneurPulse.com.au  Fact Sheet for Healthcare Providers: IncredibleEmployment.be  This test is not yet approved or cleared by the [place] FDA and has been authorized for detection and/or diagnosis of SARS-CoV-2 by FDA under an Emergency Use Authorization (EUA). This EUA will remain in effect (meaning this test can be used) for the duration of the COVID-19 declaration under Section 564(b)(1) of the Act, 21 U.S.C. section 360bbb-3(b)(1), unless the authorization is terminated or revoked.  Performed at [facility], [address] [place], [place] [address]    *Note: Due to a large number of results and/or encounters for the requested time period, some results have not been displayed. A complete set of results can be found in Results Review.    Coagulation Studies: Recent Labs    08/15/20 1505 08/16/20 0425 09/02/2020 1520  LABPROT 39.9* 41.7* 36.2*  INR 4.1* 4.4* 3.6*    Urinalysis: No results for input(s): COLORURINE, LABSPEC, PHURINE, GLUCOSEU, HGBUR, BILIRUBINUR, KETONESUR, PROTEINUR, UROBILINOGEN, NITRITE, LEUKOCYTESUR in the last 72 hours.  Invalid input(s): APPERANCEUR    Imaging: DG Chest Portable 1 View  Result Date: 08/16/2020 CLINICAL DATA:  Status post intubation EXAM: PORTABLE CHEST 1 VIEW COMPARISON:  Chest x-ray from earlier same day. FINDINGS: Endotracheal tube is now positioned just below the level of the clavicles, approximately 5 cm above the carina. Enteric tube passes below the diaphragm. Stable cardiomegaly. Stable central pulmonary vascular congestion and interstitial prominence. No pleural effusion or pneumothorax is seen. IMPRESSION: 1. Endotracheal tube now positioned with tip just below the level of the clavicles, approximately 5 cm above the carina. 2. Central pulmonary vascular congestion and probable bilateral interstitial edema. Electronically Signed  By: Franki Cabot M.D.   On: 09/04/2020 15:54   DG Chest Portable 1 View  Result Date: 08/31/2020 CLINICAL DATA:  Hypoxia EXAM: PORTABLE CHEST 1 VIEW COMPARISON:  Aug 12, 2020 FINDINGS: Endotracheal tube tip is 3.1 cm above the carina. Nasogastric tube side port seen in stomach with tip below diaphragm, not seen. No pneumothorax. There is cardiomegaly with pulmonary venous hypertension. There is interstitial prominence, likely due to a degree of interstitial pulmonary edema. No airspace opacity. No pleural effusions evident. Patient is status post coronary artery bypass grafting. There is aortic atherosclerosis. No evident  adenopathy. Old healed fracture right posterior sixth rib. IMPRESSION: Tube positions as described without pneumothorax. Cardiomegaly with pulmonary vascular congestion. Suspect mild interstitial edema. No consolidation. Status post coronary artery bypass grafting. Aortic Atherosclerosis (ICD10-I70.0). Electronically Signed   By: Lowella Grip III M.D.   On: 08/18/2020 15:44     Medications:   . sodium chloride    . fentaNYL infusion INTRAVENOUS 100 mcg/hr (09/04/2020 1620)  . norepinephrine (LEVOPHED) Adult infusion    . propofol (DIPRIVAN) infusion     . aspirin  300 mg Rectal NOW  . insulin aspart  0-6 Units Subcutaneous Q4H  . pantoprazole (PROTONIX) IV  40 mg Intravenous QHS   fentaNYL  Assessment/ Plan:  Jeremy [name] is a 65 y.o. 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 End Stage Renal Disease completed hemodialysis treatment this morning. No indication for dialysis today.  - Monitor daily for dialysis need.    2. Hypotension in setting of cardiogenic shock, PEA arrest and atrial fibrillation with rapid ventricular rhythm.  Status post DC cardioversion on 5/11 by Dr. Garen Lah.  - norepinephrine  3. Anemia of chronic kidney disease: hemoglobin 10.8, Macrocytic. - Retacrit as outpatient.  - hold ESA due to ischemic event  4. Acute respiratory failure: requiring intubation and mechanical ventilation.    LOS: 0 Rayelynn Loyal 5/13/20224:53 PM

## 2020-08-17 NOTE — Progress Notes (Signed)
Pt was transported to CT and to CCU while on the vent.

## 2020-08-17 NOTE — Progress Notes (Signed)
CBG for 2000 did not carry over. CBG was 101.  Cameron Ali, RN

## 2020-08-18 ENCOUNTER — Inpatient Hospital Stay: Payer: Medicare Other

## 2020-08-18 ENCOUNTER — Other Ambulatory Visit: Payer: Self-pay

## 2020-08-18 DIAGNOSIS — T8610 Unspecified complication of kidney transplant: Secondary | ICD-10-CM | POA: Diagnosis not present

## 2020-08-18 DIAGNOSIS — Z992 Dependence on renal dialysis: Secondary | ICD-10-CM | POA: Diagnosis not present

## 2020-08-18 DIAGNOSIS — N186 End stage renal disease: Secondary | ICD-10-CM | POA: Diagnosis not present

## 2020-08-18 DIAGNOSIS — I469 Cardiac arrest, cause unspecified: Secondary | ICD-10-CM | POA: Diagnosis not present

## 2020-08-18 LAB — TROPONIN I (HIGH SENSITIVITY)
Troponin I (High Sensitivity): 1084 ng/L (ref <18)
Troponin I (High Sensitivity): 1082 ng/L (ref <18)

## 2020-08-18 LAB — COMPREHENSIVE METABOLIC PANEL
ALT: 133 U/L — ABNORMAL HIGH (ref 0–44)
AST: 82 U/L — ABNORMAL HIGH (ref 15–41)
Albumin: 2.6 g/dL — ABNORMAL LOW (ref 3.5–5.0)
Alkaline Phosphatase: 126 U/L (ref 38–126)
Anion gap: 12 (ref 5–15)
BUN: 21 mg/dL (ref 8–23)
CO2: 31 mmol/L (ref 22–32)
Calcium: 8.4 mg/dL — ABNORMAL LOW (ref 8.9–10.3)
Chloride: 98 mmol/L (ref 98–111)
Creatinine, Ser: 4.33 mg/dL — ABNORMAL HIGH (ref 0.61–1.24)
GFR, Estimated: 15 mL/min — ABNORMAL LOW (ref >60)
Glucose, Bld: 118 mg/dL — ABNORMAL HIGH (ref 70–99)
Potassium: 3.5 mmol/L (ref 3.5–5.1)
Sodium: 141 mmol/L (ref 135–145)
Total Bilirubin: 1.8 mg/dL — ABNORMAL HIGH (ref 0.3–1.2)
Total Protein: 6.6 g/dL (ref 6.5–8.1)

## 2020-08-18 LAB — BLOOD GAS, ARTERIAL
Acid-Base Excess: 8.5 mmol/L — ABNORMAL HIGH (ref 0.0–2.0)
Bicarbonate: 34 mmol/L — ABNORMAL HIGH (ref 20.0–28.0)
FIO2: 0.4
MECHVT: 450 mL
O2 Saturation: 99.2 %
PEEP: 5 cmH2O
Patient temperature: 37
RATE: 15 resp/min
pCO2 arterial: 50 mmHg — ABNORMAL HIGH (ref 32.0–48.0)
pH, Arterial: 7.44 (ref 7.350–7.450)
pO2, Arterial: 139 mmHg — ABNORMAL HIGH (ref 83.0–108.0)

## 2020-08-18 LAB — BASIC METABOLIC PANEL
Anion gap: 13 (ref 5–15)
BUN: 22 mg/dL (ref 8–23)
CO2: 30 mmol/L (ref 22–32)
Calcium: 8.3 mg/dL — ABNORMAL LOW (ref 8.9–10.3)
Chloride: 98 mmol/L (ref 98–111)
Creatinine, Ser: 4.42 mg/dL — ABNORMAL HIGH (ref 0.61–1.24)
GFR, Estimated: 14 mL/min — ABNORMAL LOW (ref >60)
Glucose, Bld: 115 mg/dL — ABNORMAL HIGH (ref 70–99)
Potassium: 3.5 mmol/L (ref 3.5–5.1)
Sodium: 141 mmol/L (ref 135–145)

## 2020-08-18 LAB — CBC
HCT: 36 % — ABNORMAL LOW (ref 39.0–52.0)
Hemoglobin: 10.6 g/dL — ABNORMAL LOW (ref 13.0–17.0)
MCH: 30.8 pg (ref 26.0–34.0)
MCHC: 29.4 g/dL — ABNORMAL LOW (ref 30.0–36.0)
MCV: 104.7 fL — ABNORMAL HIGH (ref 80.0–100.0)
Platelets: 96 10*3/uL — ABNORMAL LOW (ref 150–400)
RBC: 3.44 MIL/uL — ABNORMAL LOW (ref 4.22–5.81)
RDW: 17.3 % — ABNORMAL HIGH (ref 11.5–15.5)
WBC: 8.5 10*3/uL (ref 4.0–10.5)
nRBC: 0.2 % (ref 0.0–0.2)

## 2020-08-18 LAB — HEMOGLOBIN A1C
Hgb A1c MFr Bld: 5.4 % (ref 4.8–5.6)
Mean Plasma Glucose: 108.28 mg/dL

## 2020-08-18 LAB — GLUCOSE, CAPILLARY
Glucose-Capillary: 99 mg/dL (ref 70–99)
Glucose-Capillary: 98 mg/dL (ref 70–99)
Glucose-Capillary: 92 mg/dL (ref 70–99)
Glucose-Capillary: 86 mg/dL (ref 70–99)
Glucose-Capillary: 62 mg/dL — ABNORMAL LOW (ref 70–99)
Glucose-Capillary: 67 mg/dL — ABNORMAL LOW (ref 70–99)
Glucose-Capillary: 99 mg/dL (ref 70–99)

## 2020-08-18 LAB — URINALYSIS, ROUTINE W REFLEX MICROSCOPIC
Bacteria, UA: NONE SEEN
Bilirubin Urine: NEGATIVE
Glucose, UA: 150 mg/dL — AB
Ketones, ur: NEGATIVE mg/dL
Nitrite: NEGATIVE
Protein, ur: >=300 mg/dL — AB
Specific Gravity, Urine: 1.03 (ref 1.005–1.030)
pH: 9 — ABNORMAL HIGH (ref 5.0–8.0)

## 2020-08-18 LAB — PROTIME-INR
INR: 3.3 — ABNORMAL HIGH (ref 0.8–1.2)
Prothrombin Time: 33.5 seconds — ABNORMAL HIGH (ref 11.4–15.2)

## 2020-08-18 LAB — PROCALCITONIN: Procalcitonin: 8.23 ng/mL

## 2020-08-18 LAB — PHOSPHORUS: Phosphorus: 5.7 mg/dL — ABNORMAL HIGH (ref 2.5–4.6)

## 2020-08-18 LAB — MAGNESIUM: Magnesium: 1.8 mg/dL (ref 1.7–2.4)

## 2020-08-18 MED ORDER — DEXTROSE 10 % IV SOLN
INTRAVENOUS | Status: DC
  Administered 2020-08-20: 100 mL/h via INTRAVENOUS

## 2020-08-18 MED ORDER — VASOPRESSIN 20 UNITS/100 ML INFUSION FOR SHOCK
0.0000 [IU]/min | INTRAVENOUS | Status: DC
  Administered 2020-08-18: 0.03 [IU]/min via INTRAVENOUS
  Administered 2020-08-19 – 2020-08-20 (×4): 0.04 [IU]/min via INTRAVENOUS
  Filled 2020-08-18 (×5): qty 100

## 2020-08-18 MED ORDER — MAGNESIUM SULFATE 2 GM/50ML IV SOLN
2.0000 g | Freq: Once | INTRAVENOUS | Status: AC
  Administered 2020-08-18: 2 g via INTRAVENOUS
  Filled 2020-08-18: qty 50

## 2020-08-18 MED ORDER — LEVETIRACETAM IN NACL 1000 MG/100ML IV SOLN
1000.0000 mg | INTRAVENOUS | Status: DC
  Administered 2020-08-19 – 2020-08-20 (×2): 1000 mg via INTRAVENOUS
  Filled 2020-08-18 (×2): qty 100

## 2020-08-18 MED ORDER — LEVETIRACETAM IN NACL 500 MG/100ML IV SOLN
500.0000 mg | Freq: Every day | INTRAVENOUS | Status: DC | PRN
  Administered 2020-08-19: 500 mg via INTRAVENOUS
  Filled 2020-08-18: qty 100

## 2020-08-18 MED ORDER — DEXTROSE 50 % IV SOLN
INTRAVENOUS | Status: AC
  Filled 2020-08-18: qty 50

## 2020-08-18 MED ORDER — LEVETIRACETAM IN NACL 1000 MG/100ML IV SOLN
1000.0000 mg | Freq: Once | INTRAVENOUS | Status: AC
  Administered 2020-08-18: 1000 mg via INTRAVENOUS
  Filled 2020-08-18: qty 100

## 2020-08-18 NOTE — Progress Notes (Addendum)
Progress Note  Patient Name: XUE LOW Date of Encounter: 08/18/2020  Primary Cardiologist: Kathlyn Sacramento, MD   Subjective   Intubated, sedated. Daughter by bedside.   Inpatient Medications    Scheduled Meds: . aspirin  300 mg Rectal NOW  . chlorhexidine gluconate (MEDLINE KIT)  15 mL Mouth Rinse BID  . Chlorhexidine Gluconate Cloth  6 each Topical Daily  . docusate  100 mg Per Tube BID  . insulin aspart  0-6 Units Subcutaneous Q4H  . mouth rinse  15 mL Mouth Rinse 10 times per day  . pantoprazole (PROTONIX) IV  40 mg Intravenous QHS  . polyethylene glycol  17 g Per Tube Daily   Continuous Infusions: . sodium chloride 10 mL/hr at 08/18/20 0726  . fentaNYL infusion INTRAVENOUS 100 mcg/hr (08/26/2020 1724)  . [START ON 08/19/2020] levETIRAcetam    . levETIRAcetam    . norepinephrine (LEVOPHED) Adult infusion 10 mcg/min (08/18/20 1610)  . propofol (DIPRIVAN) infusion 50 mcg/kg/min (08/18/20 0347)   PRN Meds: fentaNYL, levETIRAcetam, midazolam, midazolam   Vital Signs    Vitals:   08/18/20 0645 08/18/20 0700 08/18/20 0744 08/18/20 0800  BP: 126/82 (!) 142/129    Pulse: 61 60    Resp: 18 18    Temp:  (!) 96.6 F (35.9 C)  (!) 96.8 F (36 C)  TempSrc:  Bladder  Bladder  SpO2: 100% 100% 99%   Weight:      Height:        Intake/Output Summary (Last 24 hours) at 08/18/2020 0811 Last data filed at 08/18/2020 0400 Gross per 24 hour  Intake 358.16 ml  Output 0 ml  Net 358.16 ml   Last 3 Weights 08/21/2020 08/29/2020 08/16/2020  Weight (lbs) 156 lb 12 oz 156 lb 8.4 oz 156 lb 9.6 oz  Weight (kg) 71.1 kg 71 kg 71.033 kg      Telemetry    NSR with PACs, PVCs, SVT runs, 3 beat NSVT, rates 60-90s - Personally Reviewed  ECG    No new tracings - Personally Reviewed  Physical Exam   GEN: Intubated and sedated. Neck:  JVD difficult to assess due to intubation Cardiac: RRR, 1/6 systolic murmur, rubs, or gallops.  Respiratory:  Coarse breath sounds bilaterally on  ventilator GI: Soft, nontender, non-distended  MS:  Nonpitting mild edema on left side LE; right sided BKA Neuro:   Intubated and sedated Psych: Intubated and sedated  Labs    High Sensitivity Troponin:   Recent Labs  Lab 08/16/2020 1838 08/18/2020 2018 08/16/2020 2224 08/18/20 0336 08/18/20 0519  TROPONINIHS 615* 824* 961* 1,084* 1,082*      Chemistry Recent Labs  Lab 08/16/20 0425 08/07/2020 1520 08/13/2020 1838 08/18/20 0336  NA 136 139 140 141  141  K 4.1 3.2* 3.2* 3.5  3.5  CL 95* 94* 96* 98  98  CO2 _0 GLUCOSE 94 136* 130* 118*  115*  BUN 30* _1 CREATININE 5.28* 3.74* 3.78* 4.33*  4.42*  CALCIUM 8.6* 8.1* 8.0* 8.4*  8.3*  PROT 6.9 7.2  --  6.6  ALBUMIN 2.8* 2.8*  --  2.6*  AST 138* 97*  --  82*  ALT 217* 153*  --  133*  ALKPHOS 133* 142*  --  126  BILITOT 1.5* 1.5*  --  1.8*  GFRNONAA 11* 17* 17* 15*  14*  ANIONGAP 16* 18* _2 Hematology Recent Labs  Lab 08/16/20 0425 08/31/2020 1520 08/18/20 0336  WBC 5.6 12.2* 8.5  RBC 3.46* 3.52* 3.44*  HGB 10.9* 10.8* 10.6*  HCT 35.2* 37.4* 36.0*  MCV 101.7* 106.3* 104.7*  MCH 31.5 30.7 30.8  MCHC 31.0 28.9* 29.4*  RDW 17.3* 17.2* 17.3*  PLT 79* 92* 96*    BNPNo results for input(s): BNP, PROBNP in the last 168 hours.   DDimer No results for input(s): DDIMER in the last 168 hours.   Radiology    CT HEAD WO CONTRAST  Result Date: 08/11/2020 CLINICAL DATA:  Mental status changes, found unresponsive. EXAM: CT HEAD WITHOUT CONTRAST TECHNIQUE: Contiguous axial images were obtained from the base of the skull through the vertex without intravenous contrast. COMPARISON:  None. FINDINGS: Brain: No acute intracranial abnormality. Specifically, no hemorrhage, hydrocephalus, mass lesion, acute infarction, or significant intracranial injury. Vascular: No hyperdense vessel or unexpected calcification. Skull: No acute calvarial abnormality. Sinuses/Orbits: Mucosal thickening in the  ethmoid air cells. No air-fluid levels. Other: None IMPRESSION: No acute intracranial abnormality. Electronically Signed   By: Rolm Baptise M.D.   On: 08/24/2020 17:10   DG Chest Port 1 View  Result Date: 08/18/2020 CLINICAL DATA:  Endotracheal tube placement EXAM: PORTABLE CHEST 1 VIEW COMPARISON:  08/12/2020 FINDINGS: ET tube tip is above the carina. There is an NG tube with side port below GE junction. Previous median sternotomy and CABG procedure. Stable cardiac enlargement. Mild diffuse interstitial edema. IMPRESSION: 1. Mild interstitial edema 2. Stable position of ET tube with tip above carina. Electronically Signed   By: Kerby Moors M.D.   On: 08/18/2020 08:07   DG Chest Portable 1 View  Result Date: 08/14/2020 CLINICAL DATA:  Status post intubation EXAM: PORTABLE CHEST 1 VIEW COMPARISON:  Chest x-ray from earlier same day. FINDINGS: Endotracheal tube is now positioned just below the level of the clavicles, approximately 5 cm above the carina. Enteric tube passes below the diaphragm. Stable cardiomegaly. Stable central pulmonary vascular congestion and interstitial prominence. No pleural effusion or pneumothorax is seen. IMPRESSION: 1. Endotracheal tube now positioned with tip just below the level of the clavicles, approximately 5 cm above the carina. 2. Central pulmonary vascular congestion and probable bilateral interstitial edema. Electronically Signed   By: Franki Cabot M.D.   On: 08/05/2020 15:54   DG Chest Portable 1 View  Result Date: 08/27/2020 CLINICAL DATA:  Hypoxia EXAM: PORTABLE CHEST 1 VIEW COMPARISON:  Aug 12, 2020 FINDINGS: Endotracheal tube tip is 3.1 cm above the carina. Nasogastric tube side port seen in stomach with tip below diaphragm, not seen. No pneumothorax. There is cardiomegaly with pulmonary venous hypertension. There is interstitial prominence, likely due to a degree of interstitial pulmonary edema. No airspace opacity. No pleural effusions evident. Patient is  status post coronary artery bypass grafting. There is aortic atherosclerosis. No evident adenopathy. Old healed fracture right posterior sixth rib. IMPRESSION: Tube positions as described without pneumothorax. Cardiomegaly with pulmonary vascular congestion. Suspect mild interstitial edema. No consolidation. Status post coronary artery bypass grafting. Aortic Atherosclerosis (ICD10-I70.0). Electronically Signed   By: Lowella Grip III M.D.   On: 08/15/2020 15:44    Cardiac Studies   Echo 08/14/20 Left ventricular ejection fraction, by estimation, is 25 to 30%. The  left ventricle has severely decreased function. The left ventricle  demonstrates global hypokinesis. There is mild left ventricular  hypertrophy. Left ventricular diastolic parameters  are indeterminate.  2. Right ventricular systolic function is severely reduced. The right  ventricular size is moderately enlarged.  3. Left atrial size was severely dilated.  4. The mitral valve is degenerative. Mild mitral valve regurgitation.  Moderate mitral annular calcification.  5. The aortic valve has been repaired/replaced. Aortic valve  regurgitation is not visualized.  6. The inferior vena cava is normal in size with <50% respiratory  variability, suggesting right atrial pressure of 8 mmHg.   LHC 07/15/19  Prox LAD lesion is 90% stenosed.  Mid LAD-1 lesion is 95% stenosed.  Mid LAD-2 lesion is 30% stenosed.  Mid LAD to Dist LAD lesion is 60% stenosed.  Prox RCA lesion is 40% stenosed.  Mid RCA lesion is 70% stenosed.  Dist RCA lesion is 99% stenosed with 99% stenosed side branch in RPAV.  Ost RPDA to RPDA lesion is 95% stenosed.  SVG.  Origin lesion is 100% stenosed.  LIMA graft was visualized by angiography.  The graft exhibits no disease.  Origin to Prox Graft lesion is 99% stenosed.  Post intervention, there is a 0% residual stenosis.  A drug-eluting stent was successfully placed using a STENT  RESOLUTE ONYX [id number]. 1. Severe underlying two-vessel coronary artery disease including the LAD and right coronary artery. 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 Patent LIMA to LAD and occluded SVG to diagonal. The diagonal receives collaterals from the distal LAD. Patent SVG to right PDA with 99% ostial stenosis. 2. Successful angioplasty and drug-eluting stent placement to SVG to right PDA. 3. Small hematoma in the right groin noted during the diagnostic portion of the cath with suspecting oozing around the sheath due to heavy calcifications in the common femoral artery. This improved with upsizing the sheath and manual pressure. This was stable at the end of the case after deploying a minx closure device and holding additional 10 minutes of pressure. Recommendations: Resume heparin drip 8 hours after sheath pull after making sure that the right groin hematoma is stable. Resume warfarin tonight. The patient was started on clopidogrel and he is currently on low-dose aspirin. I do not think he will tolerate triple therapy. I recommend stopping aspirin before hospital discharge. For atrial fibrillation, I switch carvedilol to metoprolol tartrate 25 mg twice daily. Coronary Diagrams   Diagnostic Dominance: Right    Intervention      Patient Profile     65 y.o. male with a hx of CAD s/p CABG (09/2018), followed by subsequent PCI and drug-eluting stent to the vein graft-RPDA in April 2021, HFrEF secondary to ischemic cardiomyopathy, persistent atrial fibrillation with paroxysmal atrial flutter on warfarin, severe aortic stenosis s/p mechanical aortic valve in 2014, end-stage renal disease secondary to IgA nephropathy with renal transplant 1999 and repeat renal transplant 2014 following diagnosis of renal cell carcinoma s/p bilateral nephrectomy now on hemodialysis (MWF), COVID-19 in 03/2019, PAD s/p right BKA, anemia of chronic  disease, hypertension, hyperlipidemia,  s/p recent discharge 5/12 after admission for WCT, volume overload / hyperkalemia with urgent HD, and DCCV for Afib/flutter, and who is being seen today for the evaluation of cardiac arrest.  Assessment & Plan    Cardiac arrest Cardiogenic shock History of systolic HF (EF 56-31%  49-70%) History of CAD s/p CABG Elevated HS Tn  -- Intubated and sedated after cardiac arrest. Initial rhythm PEA with no shocks delivered and 2 rounds of epinephrine. Estimated downtime ~53mthen ROSC.  Given risk in HS Tn, consider LHC later in admission to ensure worsening heart dz has not contributed to recently reduced EF by prior echo. EKG not consistent  with STEMI and NSR with RAD, RBBB, nonspecific ST/T changes. HS Tn 214  1084. Considered is elevated HS Tn due to supply-demand mismatch in the setting of his CM, recent DCCV, and cardiac arrest. No evidence of WCT this admission. Continue to monitor on telemetry with most recent telemetry showing NSR with ectopy with brief runs AT/SVT/NSVT. Lower suspicion for CVA/PE, given supratherapeutic INR. Head CT non-acute.   Serial EKGs.   Continue to cycle HS Tn until peaked, down-trending.   Defer LHC for now and until ensure neurologic recovery, given prolonged down-time, and unless pt becomes unstable with clear evidence of ischemia.   Heparin deferred at this time for supratherapeutic INR. Daily INR.  Repeat echo could be considered to rule out acute structural changes, reassess EF/WMA, r/o cardiac tamponade. 5/10 EF 25-30%.  Continue mechanical ventilator support per critical care team. Continue vasopressors per CCM.  Further recommendations per MD.  Atrial fibrillation/atrial flutter s/p DCCV 5/11 -- S/p DCCV 5/11 and maintaining NSR at discharge.  Initial EKG shows NSR and RBBB.  Review of telemetry shows significant artifact with brief SVT/AT. Heparin initiation deferred until INR below 2.0, at which time recommend  start given recent DCCV and history of Afib, as well as known mechanical AVR.   ESRD on HD --Per nephrology. Daily BMET. Maintain electrolytes at goal.  For questions or updates, please contact [place] Please consult [email or web address] for contact info under        Signed, Arvil Chaco, PA-C  08/18/2020, 8:11 AM      I have seen and examined this patient with Marrianne Mood.  Agree with above, note added to reflect my findings.  On exam, regular rhythm, right BKA.  Patient remained stable currently intubated and sedated.  He has had some nonsustained VT, but nothing more prolonged.  Would continue with current management per critical care.  Should he have meaningful neurologic recovery, left heart catheterization would be indicated.  Otherwise, continue with current management.  Cardiology to see as needed through the weekend.  Rosaleah Person M. Cleotha Tsang MD 08/18/2020 12:13 PM

## 2020-08-18 NOTE — Progress Notes (Signed)
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 tach and patient was given 1 g calcium and 150 mg bolus of amiodarone.  08/12/2020 - 08/14/20 -Patient underwent back-to-back hemodialysis, ultimately on admission on 5/8 and then resumed his schedule, second HD on 5/9 -Cardiology was consulted due to atrial flutter, ?Vtach per EMS.  -HRstill uncontrolled, in atrial flutter, started amiodarone IV for rate control, possible cardioversion on 5/11 if patient continues to be tachycardiac  5/10 another round of HD provided, ECHO EF 25% 5/11 s/p cardioversion 5/12 discharged home  5/13  Patient reportedly was dialyzed earlier today, and then family found him down and pulseless this afternoon while in the bathroom,  Family started CPR, then fire, then EMS reports an additional 10 minutes of CPR. Sounds like 20 minutes of total downtime. Initial rhythm was PEA. No shocks provided. 2 rounds of epi and King airway was placed in the field, replaced with ETT  5/13 ICU team called for admission, plan for Hypothermia protocol if CT head is NEG  5/14 multiorgan failure, signs of brain damage, on hypothermia protocol     Micro Data:  COVID NEG INF A/B NEG     Interim History / Subjective:  Remains intubated Hypothermia  protocol Cardiogenic shock +multiorgan failure        Objective   Blood pressure (!) 142/129, pulse 60, temperature (!) 96.6 F (35.9 C), temperature source Bladder, resp. rate 18, height 5\' 10"  (1.778 m), weight 71.1 kg, SpO2 100 %.    Vent Mode: PRVC FiO2 (%):  [30 %-80 %] 40 % Set Rate:  [15 bmp-18 bmp] 15 bmp Vt Set:  [450 mL] 450 mL PEEP:  [5 cmH20] 5 cmH20   Intake/Output Summary (Last 24 hours) at 08/18/2020 0714 Last data filed at 08/18/2020 0400 Gross per 24 hour  Intake 358.16 ml  Output 0 ml  Net 358.16 ml   Filed Weights   08/11/2020 1548 09/01/2020 1822  Weight: 71 kg 71.1 kg      REVIEW OF SYSTEMS  PATIENT IS UNABLE TO PROVIDE COMPLETE REVIEW OF SYSTEMS DUE TO SEVERE CRITICAL ILLNESS AND TOXIC METABOLIC ENCEPHALOPATHY   PHYSICAL EXAMINATION:  GENERAL:critically ill appearing, +resp distress HEAD: Normocephalic, atraumatic.  EYES: Pupils equal, round, reactive to light.  No scleral icterus.  MOUTH: Moist mucosal membrane. NECK: Supple. PULMONARY: +rhonchi, +wheezing CARDIOVASCULAR: S1 and S2. Regular rate and rhythm. No murmurs, rubs, or gallops.  GASTROINTESTINAL: Soft, nontender, -distended. Positive bowel sounds.  MUSCULOSKELETAL: No swelling, clubbing, or edema.  NEUROLOGIC: obtunded SKIN:intact,warm,dry   Labs/imaging that I havepersonally reviewed  (right click and "Reselect all SmartList Selections" daily)    ASSESSMENT AND PLAN SYNOPSIS   65 yo white male with recent admission for hyperkalemia and WCT now with acute sudden cardiac arrest due to severe acute systolic heart failure and acute ischemic cardiomyopathy with severe acidosis in setting of systolic  CHF, ESRD on HD s/p AVR on Coumadin with toxic metabolic encephalopathy    Severe ACUTE Hypoxic and Hypercapnic Respiratory Failure -continue Mechanical Ventilator support -continue Bronchodilator Therapy -Wean Fio2 and PEEP as tolerated -VAP/VENT bundle  implementation    CARDIAC FAILURE-acute  systolic dysfunction -oxygen as needed -follow up cardiac enzymes as indicated   CARDIAC ICU monitoring   END STAGE KIDNEY INJURY/Renal Failure -continue Foley Catheter-assess need -Avoid nephrotoxic agents -Follow urine output, BMP -Ensure adequate renal perfusion, optimize oxygenation -Renal dose medications HD or CRRT per npehrology    NEUROLOGY Acute toxic metabolic encephalopathy, need for sedation Goal RASS -2 to -3 Loaded with Keppra Prognosis is very poor Signs of brain damane   CARDIOGENIC SHOCK -use vasopressors to keep MAP>65 as needed -follow ABG and LA -follow up cultures  ENDO - ICU hypoglycemic\Hyperglycemia protocol -check FSBS per protocol   GI GI PROPHYLAXIS as indicated  NUTRITIONAL STATUS DIET-->NPO Constipation protocol as indicated   ELECTROLYTES -follow labs as needed -replace as needed -pharmacy consultation and following   Best practice (right click and "Reselect all SmartList Selections" daily)  Diet:  NPO Pain/Anxiety/Delirium protocol (if indicated): Yes (RASS goal -2) VAP protocol (if indicated): Yes DVT prophylaxis: Contraindicated GI prophylaxis: PPI Glucose control:  SSI Yes Central venous access:  Yes, and it is still needed Arterial line:  Yes, and it is still needed Foley:  N/A Mobility:  bed rest  PT consulted: N/A Code Status:  full code Disposition: ICU    Labs   CBC: Recent Labs  Lab 08/12/20 0946 08/13/20 0510 08/15/20 0454 08/16/20 0425 08/21/2020 1520 08/18/20 0336  WBC 13.4* 6.7 5.7 5.6 12.2* 8.5  NEUTROABS 5.9  --   --   --  4.6  --   HGB 12.3* 11.8* 11.0* 10.9* 10.8* 10.6*  HCT 42.2 38.9* 36.0* 35.2* 37.4* 36.0*  MCV 108.2* 102.4* 102.6* 101.7* 106.3* 104.7*  PLT 106* 91* 89* 79* 92* 96*    Basic Metabolic Panel: Recent Labs  Lab 08/15/20 0454 08/16/20 0425 08/31/2020 1520 08/23/2020 1838 08/18/20 0336  NA 134* 136 139 140 141  141  K 4.0  4.1 3.2* 3.2* 3.5  3.5  CL 93* 95* 94* 96* 98  98  CO2 26 25 27 31 31  30   GLUCOSE 101* 94 136* 130* 118*  115*  BUN 41* 30* 16 18 21  22   CREATININE 6.26* 5.28* 3.74* 3.78* 4.33*  4.42*  CALCIUM 8.5* 8.6* 8.1* 8.0* 8.4*  8.3*  MG  --   --  2.1  --  1.8  PHOS  --   --   --   --  5.7*   GFR: Estimated Creatinine Clearance: 17.2 mL/min (A) (by C-G formula based on SCr of 4.42 mg/dL (H)). Recent Labs  Lab 08/15/20 0454 08/16/20 0425 08/11/2020 1520 08/23/2020 1838 08/18/20 0336  PROCALCITON  --   --   --  5.64 8.23  WBC 5.7 5.6 12.2*  --  8.5  LATICACIDVEN  --   --  8.4* 2.5*  --     Liver Function Tests: Recent Labs  Lab 08/12/20 0946 08/15/20 0454 08/16/20 0425 08/26/2020 1520 08/18/20 0336  AST 105* 235* 138* 97* 82*  ALT 96* 291* 217* 153* 133*  ALKPHOS 179* 135* 133* 142* 126  BILITOT 2.0* 1.4* 1.5* 1.5* 1.8*  PROT 7.5 7.0 6.9 7.2 6.6  ALBUMIN 3.1* 2.9* 2.8* 2.8* 2.6*   No results for input(s): LIPASE, AMYLASE in the last 168 hours. No results for  input(s): AMMONIA in the last 168 hours.  ABG    Component Value Date/Time   PHART 7.44 08/18/2020 0432   PCO2ART 50 (H) 08/18/2020 0432   PO2ART 139 (H) 08/18/2020 0432   HCO3 34.0 (H) 08/18/2020 0432   TCO2 29 07/13/2020 0851   ACIDBASEDEF 13.8 (H) 08/08/2020 1553   O2SAT 99.2 08/18/2020 0432     Coagulation Profile: Recent Labs  Lab 08/14/20 0540 08/15/20 1505 08/16/20 0425 08/21/2020 1520 08/18/20 0336  INR 3.3* 4.1* 4.4* 3.6* 3.3*    Cardiac Enzymes: No results for input(s): CKTOTAL, CKMB, CKMBINDEX, TROPONINI in the last 168 hours.  HbA1C: Hgb A1c MFr Bld  Date/Time Value Ref Range Status  08/13/2020 05:10 AM 5.0 4.8 - 5.6 % Final    Comment:    (NOTE) Pre diabetes:          5.7%-6.4%  Diabetes:              >6.4%  Glycemic control for   <7.0% adults with diabetes   09/15/2018 05:55 AM 5.9 (H) 4.8 - 5.6 % Final    Comment:    (NOTE) Pre diabetes:          5.7%-6.4% Diabetes:               >6.4% Glycemic control for   <7.0% adults with diabetes     CBG: Recent Labs  Lab 08/12/20 1627 08/12/20 1915 08/11/2020 1944 08/18/2020 2305 08/18/20 0303  GLUCAP 109* 102* 104* 103* 99    Allergies Allergies  Allergen Reactions  . Statins Other (See Comments)    Arthralgias (intolerance), Myalgias (intolerance)  Pt taking pravastatin   . Gabapentin     Drowsy   . Sertraline Rash       DVT/GI PRX  assessed I Assessed the need for Labs I Assessed the need for Foley I Assessed the need for Central Venous Line Family Discussion when available I Assessed the need for Mobilization I made an Assessment of medications to be adjusted accordingly Safety Risk assessment completed  CASE DISCUSSED IN MULTIDISCIPLINARY ROUNDS WITH ICU TEAM     Critical Care Time devoted to patient care services described in this note is 65 minutes.  Critical care was necessary to treat or prevent imminent or life-threatening deterioration. Overall, patient is critically ill, prognosis is guarded.  Patient with Multiorgan failure and at high risk for cardiac arrest and death.    Corrin Parker, M.D.  [facility]  Medical Director [place] Director [facility] Cardio-Pulmonary Department

## 2020-08-18 NOTE — Progress Notes (Signed)
GOALS OF CARE DISCUSSION  The Clinical status was relayed to family in detail. Daughter and Girlfriend at bedside  Updated and notified of patients medical condition.    Patient remains unresponsive and will not open eyes to command.   Patient is having a weak cough and struggling to remove secretions.   Patient with increased WOB and using accessory muscles to breathe Explained to family course of therapy and the modalities    Patient with Progressive multiorgan failure with a very high probablity of a very minimal chance of meaningful recovery despite all aggressive and optimal medical therapy.   Family understands the situation.  They have consented and agreed to DNR status   Family are satisfied with Plan of action and management. All questions answered  Additional CC time 35 mins   Jewell Haught Patricia Pesa, M.D.  [facility]  Medical Director [name] Director [facility] Cardio-Pulmonary Department

## 2020-08-18 NOTE — Progress Notes (Incomplete)
Patient continued on target temperature management during shift. Reached 24 hours of 36 C at TRTRTR.  Patient with very frequent and strong twitching and jerking of upper extremities, shoulders, and face during beginning of shift. Per previous shift RN, this activity had been increasing in the early morning despite increases in patient's sedation. Previous shift NP at bedside during handoff and witnessed activity as well, ordered keppra. Activity independent from stimulation. Small decrease in frequency after keppra infusion. Further decrease in frequency and intensity after propofol infusion increased. Even on max dose propofol, patient with still have occasional small twitching motions. ICU team aware. No WUA today due to target temperature management, but on sedation no gag, cough, or corneal reflexes noted and no movement in response to stimulation.  Only output from foley catheter was 4 mL of urine, which was enough per lab for urinalysis but not other tests ordered for patient. Significant other at bedside reported that this was the first urine she was aware patient had made in years.

## 2020-08-18 NOTE — Progress Notes (Signed)
[facility]  ROUNDING NOTE   Subjective:   [name] was admitted to [facility] on 08/30/2020 for Cardiac arrest [facility]) [I46.9]  Discharged to home on 5/12 and family found him down with PEA. Family started CPR and transferred to the ED.   Patient underwent cardioversion on 5/11. Previous admission for Acute on Ch Sys CHF, SVT chronic A Flutter,  Non healing Rt BKA  Patient's daughter is a respiratory therapist Significant other at bedside  Patient remains intubated , vent assisted Sedated with propofol, fentanyl Requiring Norepi Fio2 30%   Objective:  Vital signs in last 24 hours:  Temp:  [93.5 F (34.2 C)-98.7 F (37.1 C)] 96.8 F (36 C) (05/14 0800) Pulse Rate:  [42-122] 60 (05/14 0700) Resp:  [13-28] 18 (05/14 0700) BP: (89-157)/(55-129) 142/129 (05/14 0700) SpO2:  [97 %-100 %] 99 % (05/14 0744) Arterial Line BP: (93-157)/(35-69) 135/50 (05/14 0700) FiO2 (%):  [30 %-80 %] 30 % (05/14 0800) Weight:  [71 kg-71.1 kg] 71.1 kg (05/13 1822)  Weight change:  Filed Weights   08/29/2020 1548 08/25/2020 1822  Weight: 71 kg 71.1 kg    Intake/Output: I/O last 3 completed shifts: In: 358.2 [I.V.:358.2] Out: 0    Intake/Output this shift:  No intake/output data recorded.  Physical Exam: General: Critically ill  Head: ETT   Eyes: Sluggish pupils  Lungs:  Diminished bilaterally, vent assisted  Heart: Irregular, +murmur  Abdomen:  Soft   Extremities:  no edema, Right BKA with clean dressings.   Neurologic: Intubated and sedated  Skin: No lesions  Access: Left AVF    Basic Metabolic Panel: Recent Labs  Lab 08/15/20 0454 08/16/20 0425 09/04/2020 1520 08/18/2020 1838 08/18/20 0336  NA 134* 136 139 140 141  141  K 4.0 4.1 3.2* 3.2* 3.5  3.5  CL 93* 95* 94* 96* 98  98  CO2 _0 GLUCOSE 101* 94 136* 130* 118*  115*  BUN 41* 30* _1 CREATININE 6.26* 5.28* 3.74* 3.78* 4.33*  4.42*  CALCIUM 8.5* 8.6* 8.1* 8.0* 8.4*  8.3*  MG   --   --  2.1  --  1.8  PHOS  --   --   --   --  5.7*    Liver Function Tests: Recent Labs  Lab 08/12/20 0946 08/15/20 0454 08/16/20 0425 09/04/2020 1520 08/18/20 0336  AST 105* 235* 138* 97* 82*  ALT 96* 291* 217* 153* 133*  ALKPHOS 179* 135* 133* 142* 126  BILITOT 2.0* 1.4* 1.5* 1.5* 1.8*  PROT 7.5 7.0 6.9 7.2 6.6  ALBUMIN 3.1* 2.9* 2.8* 2.8* 2.6*   No results for input(s): LIPASE, AMYLASE in the last 168 hours. No results for input(s): AMMONIA in the last 168 hours.  CBC: Recent Labs  Lab 08/12/20 0946 08/13/20 0510 08/15/20 0454 08/16/20 0425 08/05/2020 1520 08/18/20 0336  WBC 13.4* 6.7 5.7 5.6 12.2* 8.5  NEUTROABS 5.9  --   --   --  4.6  --   HGB 12.3* 11.8* 11.0* 10.9* 10.8* 10.6*  HCT 42.2 38.9* 36.0* 35.2* 37.4* 36.0*  MCV 108.2* 102.4* 102.6* 101.7* 106.3* 104.7*  PLT 106* 91* 89* 79* 92* 96*    Cardiac Enzymes: No results for input(s): CKTOTAL, CKMB, CKMBINDEX, TROPONINI in the last 168 hours.  BNP: Invalid input(s): POCBNP  CBG: Recent Labs  Lab 08/12/20 1915 08/24/2020 1944 08/09/2020 2305 08/18/20 0303 08/18/20 0741  GLUCAP 102* 104* 103* 99 98  Microbiology: Results for orders placed or performed during the hospital encounter of 08/21/2020  Resp Panel by RT-PCR (Flu A&B, Covid) Nasopharyngeal Swab     Status: None   Collection Time: 09/03/2020  3:34 PM   Specimen: Nasopharyngeal Swab; Nasopharyngeal(NP) swabs in vial transport medium  Result Value Ref Range Status   SARS Coronavirus 2 by RT PCR NEGATIVE NEGATIVE Final    Comment: (NOTE) SARS-CoV-2 target nucleic acids are NOT DETECTED.  The SARS-CoV-2 RNA is generally detectable in upper respiratory specimens during the acute phase of infection. The lowest concentration of SARS-CoV-2 viral copies this assay can detect is 138 copies/mL. A negative result does not preclude SARS-Cov-2 infection and should not be used as the sole basis for treatment or other patient management decisions. A  negative result may occur with  improper specimen collection/handling, submission of specimen other than nasopharyngeal swab, presence of viral mutation(s) within the areas targeted by this assay, and inadequate number of viral copies(<138 copies/mL). A negative result must be combined with clinical observations, patient history, and epidemiological information. The expected result is Negative.  Fact Sheet for Patients:  EntrepreneurPulse.com.au  Fact Sheet for Healthcare Providers:  IncredibleEmployment.be  This test is no t yet approved or cleared by the [place] FDA and  has been authorized for detection and/or diagnosis of SARS-CoV-2 by FDA under an Emergency Use Authorization (EUA). This EUA will remain  in effect (meaning this test can be used) for the duration of the COVID-19 declaration under Section 564(b)(1) of the Act, 21 U.S.C.section 360bbb-3(b)(1), unless the authorization is terminated  or revoked sooner.       Influenza A by PCR NEGATIVE NEGATIVE Final   Influenza B by PCR NEGATIVE NEGATIVE Final    Comment: (NOTE) The Xpert Xpress SARS-CoV-2/FLU/RSV plus assay is intended as an aid in the diagnosis of influenza from Nasopharyngeal swab specimens and should not be used as a sole basis for treatment. Nasal washings and aspirates are unacceptable for Xpert Xpress SARS-CoV-2/FLU/RSV testing.  Fact Sheet for Patients: EntrepreneurPulse.com.au  Fact Sheet for Healthcare Providers: IncredibleEmployment.be  This test is not yet approved or cleared by the [place] FDA and has been authorized for detection and/or diagnosis of SARS-CoV-2 by FDA under an Emergency Use Authorization (EUA). This EUA will remain in effect (meaning this test can be used) for the duration of the COVID-19 declaration under Section 564(b)(1) of the Act, 21 U.S.C. section 360bbb-3(b)(1), unless the authorization  is terminated or revoked.  Performed at [facility], [address] [place], [place] [address]   MRSA PCR Screening     Status: None   Collection Time: 08/27/2020  6:38 PM   Specimen: Nasopharyngeal  Result Value Ref Range Status   MRSA by PCR NEGATIVE NEGATIVE Final    Comment:        The GeneXpert MRSA Assay (FDA approved for NASAL specimens only), is one component of a comprehensive MRSA colonization surveillance program. It is not intended to diagnose MRSA infection nor to guide or monitor treatment for MRSA infections. Performed at [facility], [address] [place], [place] [address]    *Note: Due to a large number of results and/or encounters for the requested time period, some results have not been displayed. A complete set of results can be found in Results Review.    Coagulation Studies: Recent Labs    08/15/20 1505 08/16/20 0425 08/06/2020 1520 08/18/20 0336  LABPROT 39.9* 41.7* 36.2* 33.5*  INR 4.1* 4.4* 3.6* 3.3*  Urinalysis: No results for input(s): COLORURINE, LABSPEC, PHURINE, GLUCOSEU, HGBUR, BILIRUBINUR, KETONESUR, PROTEINUR, UROBILINOGEN, NITRITE, LEUKOCYTESUR in the last 72 hours.  Invalid input(s): APPERANCEUR    Imaging: CT HEAD WO CONTRAST  Result Date: 08/08/2020 CLINICAL DATA:  Mental status changes, found unresponsive. EXAM: CT HEAD WITHOUT CONTRAST TECHNIQUE: Contiguous axial images were obtained from the base of the skull through the vertex without intravenous contrast. COMPARISON:  None. FINDINGS: Brain: No acute intracranial abnormality. Specifically, no hemorrhage, hydrocephalus, mass lesion, acute infarction, or significant intracranial injury. Vascular: No hyperdense vessel or unexpected calcification. Skull: No acute calvarial abnormality. Sinuses/Orbits: Mucosal thickening in the ethmoid air cells. No air-fluid levels. Other: None IMPRESSION: No acute intracranial abnormality. Electronically Signed   By: Rolm Baptise M.D.   On: 08/13/2020 17:10   DG Chest Port 1 View  Result Date: 08/18/2020 CLINICAL DATA:  Endotracheal tube placement EXAM: PORTABLE CHEST 1 VIEW COMPARISON:  08/24/2020 FINDINGS: ET tube tip is above the carina. There is an NG tube with side port below GE junction. Previous median sternotomy and CABG procedure. Stable cardiac enlargement. Mild diffuse interstitial edema. IMPRESSION: 1. Mild interstitial edema 2. Stable position of ET tube with tip above carina. Electronically Signed   By: Kerby Moors M.D.   On: 08/18/2020 08:07   DG Chest Portable 1 View  Result Date: 08/12/2020 CLINICAL DATA:  Status post intubation EXAM: PORTABLE CHEST 1 VIEW COMPARISON:  Chest x-ray from earlier same day. FINDINGS: Endotracheal tube is now positioned just below the level of the clavicles, approximately 5 cm above the carina. Enteric tube passes below the diaphragm. Stable cardiomegaly. Stable central pulmonary vascular congestion and interstitial prominence. No pleural effusion or pneumothorax is seen. IMPRESSION: 1. Endotracheal tube now positioned with tip just below the level of the clavicles, approximately 5 cm above the carina. 2. Central pulmonary vascular congestion and probable bilateral interstitial edema. Electronically Signed   By: Franki Cabot M.D.   On: 08/30/2020 15:54   DG Chest Portable 1 View  Result Date: 08/12/2020 CLINICAL DATA:  Hypoxia EXAM: PORTABLE CHEST 1 VIEW COMPARISON:  Aug 12, 2020 FINDINGS: Endotracheal tube tip is 3.1 cm above the carina. Nasogastric tube side port seen in stomach with tip below diaphragm, not seen. No pneumothorax. There is cardiomegaly with pulmonary venous hypertension. There is interstitial prominence, likely due to a degree of interstitial pulmonary edema. No airspace opacity. No pleural effusions evident. Patient is status post coronary artery bypass grafting. There is aortic atherosclerosis. No evident adenopathy. Old healed fracture right posterior  sixth rib. IMPRESSION: Tube positions as described without pneumothorax. Cardiomegaly with pulmonary vascular congestion. Suspect mild interstitial edema. No consolidation. Status post coronary artery bypass grafting. Aortic Atherosclerosis (ICD10-I70.0). Electronically Signed   By: Lowella Grip III M.D.   On: 08/08/2020 15:44     Medications:   . sodium chloride 10 mL/hr at 08/18/20 0726  . fentaNYL infusion INTRAVENOUS 100 mcg/hr (08/09/2020 1724)  . [START ON 08/19/2020] levETIRAcetam    . levETIRAcetam    . norepinephrine (LEVOPHED) Adult infusion 10 mcg/min (08/18/20 6301)  . propofol (DIPRIVAN) infusion 70 mcg/kg/min (08/18/20 0827)   . aspirin  300 mg Rectal NOW  . chlorhexidine gluconate (MEDLINE KIT)  15 mL Mouth Rinse BID  . Chlorhexidine Gluconate Cloth  6 each Topical Daily  . docusate  100 mg Per Tube BID  . insulin aspart  0-6 Units Subcutaneous Q4H  . mouth rinse  15 mL Mouth Rinse 10 times per day  . pantoprazole (  PROTONIX) IV  40 mg Intravenous QHS  . polyethylene glycol  17 g Per Tube Daily   fentaNYL, levETIRAcetam, midazolam, midazolam  Assessment/ Plan:  [name] is a 65 y.o. 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 End Stage Renal Disease completed hemodialysis Friday AM. No indication for dialysis today.  - Monitor daily for dialysis need.  - Next HD tentatively scheduled for Monday   2. Hypotension in setting of recent cardiogenic shock, PEA arrest and atrial fibrillation with rapid ventricular  rhythm.  Status post DC cardioversion on 5/11 by Dr. Garen Lah.  - norepinephrine  3. Anemia of chronic kidney disease: Lab Results  Component Value Date   HGB 10.6 (L) 08/18/2020     -Retacrit as outpatient.   4. Acute respiratory failure:  requiring intubation and mechanical ventilation.    LOS: 1 Maxey Ransom 5/14/20228:50 AM

## 2020-08-19 ENCOUNTER — Inpatient Hospital Stay: Payer: Medicare Other

## 2020-08-19 ENCOUNTER — Encounter: Payer: Self-pay | Admitting: Internal Medicine

## 2020-08-19 DIAGNOSIS — Z992 Dependence on renal dialysis: Secondary | ICD-10-CM | POA: Diagnosis not present

## 2020-08-19 DIAGNOSIS — R570 Cardiogenic shock: Secondary | ICD-10-CM

## 2020-08-19 DIAGNOSIS — N186 End stage renal disease: Secondary | ICD-10-CM | POA: Diagnosis not present

## 2020-08-19 DIAGNOSIS — T8610 Unspecified complication of kidney transplant: Secondary | ICD-10-CM | POA: Diagnosis not present

## 2020-08-19 DIAGNOSIS — I469 Cardiac arrest, cause unspecified: Secondary | ICD-10-CM | POA: Diagnosis not present

## 2020-08-19 DIAGNOSIS — G931 Anoxic brain damage, not elsewhere classified: Secondary | ICD-10-CM

## 2020-08-19 LAB — CBC
HCT: 37.6 % — ABNORMAL LOW (ref 39.0–52.0)
Hemoglobin: 11 g/dL — ABNORMAL LOW (ref 13.0–17.0)
MCH: 30.7 pg (ref 26.0–34.0)
MCHC: 29.3 g/dL — ABNORMAL LOW (ref 30.0–36.0)
MCV: 105 fL — ABNORMAL HIGH (ref 80.0–100.0)
Platelets: 82 10*3/uL — ABNORMAL LOW (ref 150–400)
RBC: 3.58 MIL/uL — ABNORMAL LOW (ref 4.22–5.81)
RDW: 17.3 % — ABNORMAL HIGH (ref 11.5–15.5)
WBC: 6.8 10*3/uL (ref 4.0–10.5)
nRBC: 0.3 % — ABNORMAL HIGH (ref 0.0–0.2)

## 2020-08-19 LAB — GLUCOSE, CAPILLARY
Glucose-Capillary: 101 mg/dL — ABNORMAL HIGH (ref 70–99)
Glucose-Capillary: 101 mg/dL — ABNORMAL HIGH (ref 70–99)
Glucose-Capillary: 106 mg/dL — ABNORMAL HIGH (ref 70–99)
Glucose-Capillary: 112 mg/dL — ABNORMAL HIGH (ref 70–99)
Glucose-Capillary: 123 mg/dL — ABNORMAL HIGH (ref 70–99)
Glucose-Capillary: 140 mg/dL — ABNORMAL HIGH (ref 70–99)
Glucose-Capillary: 54 mg/dL — ABNORMAL LOW (ref 70–99)
Glucose-Capillary: 55 mg/dL — ABNORMAL LOW (ref 70–99)
Glucose-Capillary: 57 mg/dL — ABNORMAL LOW (ref 70–99)
Glucose-Capillary: 59 mg/dL — ABNORMAL LOW (ref 70–99)
Glucose-Capillary: 65 mg/dL — ABNORMAL LOW (ref 70–99)
Glucose-Capillary: 68 mg/dL — ABNORMAL LOW (ref 70–99)
Glucose-Capillary: 70 mg/dL (ref 70–99)
Glucose-Capillary: 86 mg/dL (ref 70–99)
Glucose-Capillary: 88 mg/dL (ref 70–99)
Glucose-Capillary: 93 mg/dL (ref 70–99)
Glucose-Capillary: 97 mg/dL (ref 70–99)
Glucose-Capillary: 98 mg/dL (ref 70–99)

## 2020-08-19 LAB — BASIC METABOLIC PANEL
Anion gap: 13 (ref 5–15)
BUN: 28 mg/dL — ABNORMAL HIGH (ref 8–23)
CO2: 27 mmol/L (ref 22–32)
Calcium: 8.1 mg/dL — ABNORMAL LOW (ref 8.9–10.3)
Chloride: 95 mmol/L — ABNORMAL LOW (ref 98–111)
Creatinine, Ser: 5.31 mg/dL — ABNORMAL HIGH (ref 0.61–1.24)
GFR, Estimated: 11 mL/min — ABNORMAL LOW (ref >60)
Glucose, Bld: 322 mg/dL — ABNORMAL HIGH (ref 70–99)
Potassium: 2.9 mmol/L — ABNORMAL LOW (ref 3.5–5.1)
Sodium: 135 mmol/L (ref 135–145)

## 2020-08-19 LAB — APTT: aPTT: 42 seconds — ABNORMAL HIGH (ref 24–36)

## 2020-08-19 LAB — PHOSPHORUS: Phosphorus: 5.9 mg/dL — ABNORMAL HIGH (ref 2.5–4.6)

## 2020-08-19 LAB — PROTIME-INR
INR: 2.6 — ABNORMAL HIGH (ref 0.8–1.2)
Prothrombin Time: 27.8 seconds — ABNORMAL HIGH (ref 11.4–15.2)

## 2020-08-19 LAB — MAGNESIUM: Magnesium: 2.1 mg/dL (ref 1.7–2.4)

## 2020-08-19 LAB — PROCALCITONIN: Procalcitonin: 13.21 ng/mL

## 2020-08-19 MED ORDER — DEXTROSE 50 % IV SOLN
25.0000 mL | INTRAVENOUS | Status: DC | PRN
  Administered 2020-08-19: 25 mL via INTRAVENOUS
  Administered 2020-08-19: 12.5 mL via INTRAVENOUS
  Filled 2020-08-19: qty 50

## 2020-08-19 MED ORDER — DEXTROSE 50 % IV SOLN
INTRAVENOUS | Status: AC
  Filled 2020-08-19: qty 50

## 2020-08-19 MED ORDER — POTASSIUM CHLORIDE 10 MEQ/100ML IV SOLN
10.0000 meq | INTRAVENOUS | Status: AC
  Administered 2020-08-19 (×2): 10 meq via INTRAVENOUS
  Filled 2020-08-19 (×2): qty 100

## 2020-08-19 MED ORDER — LORAZEPAM 2 MG/ML IJ SOLN
2.0000 mg | INTRAMUSCULAR | Status: DC | PRN
  Administered 2020-08-19 (×2): 2 mg via INTRAVENOUS
  Administered 2020-08-20 (×2): 4 mg via INTRAVENOUS
  Administered 2020-08-20 (×2): 2 mg via INTRAVENOUS
  Administered 2020-08-20: 4 mg via INTRAVENOUS
  Filled 2020-08-19 (×2): qty 1
  Filled 2020-08-19 (×2): qty 2
  Filled 2020-08-19: qty 1
  Filled 2020-08-19: qty 2
  Filled 2020-08-19: qty 1

## 2020-08-19 MED ORDER — DEXTROSE 50 % IV SOLN
12.5000 g | INTRAVENOUS | Status: AC
  Administered 2020-08-19: 12.5 g via INTRAVENOUS

## 2020-08-19 MED ORDER — DEXTROSE 50 % IV SOLN
INTRAVENOUS | Status: AC
  Administered 2020-08-19: 50 mL via INTRAVENOUS
  Filled 2020-08-19: qty 50

## 2020-08-19 MED ORDER — LORAZEPAM 2 MG/ML IJ SOLN
INTRAMUSCULAR | Status: AC
  Administered 2020-08-19: 2 mg via INTRAVENOUS
  Filled 2020-08-19: qty 1

## 2020-08-19 MED ORDER — DEXTROSE 50 % IV SOLN
1.0000 | Freq: Once | INTRAVENOUS | Status: AC
  Administered 2020-08-19: 50 mL via INTRAVENOUS

## 2020-08-19 MED ORDER — LORAZEPAM 2 MG/ML IJ SOLN
2.0000 mg | Freq: Once | INTRAMUSCULAR | Status: AC
  Administered 2020-08-19: 2 mg via INTRAVENOUS

## 2020-08-19 MED ORDER — DEXTROSE 50 % IV SOLN
INTRAVENOUS | Status: AC
  Administered 2020-08-19: 50 mL
  Filled 2020-08-19: qty 50

## 2020-08-19 NOTE — Progress Notes (Addendum)
100 Treated for lower lip quiver-Questionable seizure.  Stopped with Lorazapam 2 mg ivp. 1800 Rough day for family. Informed that patients chances of recovery are < 10%. MRI shows global anoxic brain injury. No spontaneous movements, respirations or anything noted after patient rewarmed. Treated for seizure like activity- lower lip quivering x 1. Patients mouth continues to be dry even with every 2 hour mouth care. Skin appears waxy with jaundice under tones. Left extremity warm -thrill and bruit still present. Left foot and right arm cold to touch. Pulses weaker than 0800. Right stump dressing not changed.

## 2020-08-19 NOTE — Consult Note (Signed)
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  Initially using NPWT, then discontinued due to progress and currently s/p graft application. See by my partner during last admission, D. Engels on 08/13/2020. He is to follow with his vascular surgery office every 2 weeks. Wound type: Chronic full thickness wound, now s/p graft application by vascular surgery. Pressure Injury POA: N/A Measurement: 3cm x 9cm with no depth. Graft with good take. Wound bed:N/A Drainage (amount, consistency, odor) N/A Periwound:intact Dressing procedure/placement/frequency: Goal is to maintain graft, prevent trauma and infection.  POC will be for twice weekly dressing changes on Sundays and Wednesdays using a saline cleanse and application of a silicone nonadherent (Mepitel, Lawson # [id number]) topped with an antimicrobial nonadherent (Xeroform, Lawson # [id number]) and covered with an ABD pad secured with Kerlix roll gauze.  The patient is to wear his own stocking and this is secured with paper tape.  He will follow with Dr. Carlis Abbott or the PA-C, Cena Benton as able.  The wife assists with performing the wound care in the home between appointments.  [place] nursing team will not follow, but will remain available to this patient, the nursing and medical teams.  Please re-consult if needed. Thanks, Maudie Flakes, MSN, RN, S.N.P.J., Arther Abbott  Pager# [phone]

## 2020-08-19 NOTE — Progress Notes (Signed)
1610 Patients skin cold to touch arms and left leg. Left foot ruddy in color. Toes are purple in color. L posterior tibial pulse doppler only. No left pedal pulse doppler ed. Family in for end of life visitation. No reaction to pain or any stimulation.

## 2020-08-19 NOTE — Progress Notes (Signed)
RT assisted with patient transfer to MRI. Due to not being Unable to obtain saturation on any finger with MRI compatible probe, FIO2 increased to 100% for scan. Once patient returned to transport monitor sats were 98% after scan.

## 2020-08-19 NOTE — Progress Notes (Signed)
GOALS OF CARE DISCUSSION  The Clinical status was relayed to family in detail. Daughter at bedside  Updated and notified of patients medical condition.    Patient remains unresponsive and will not open eyes to command.   Patient is having a weak cough and struggling to remove secretions.   Patient with increased WOB and using accessory muscles to breathe Explained to family course of therapy and the modalities    Patient with Progressive multiorgan failure with a very high probablity of a very minimal chance of meaningful recovery despite all aggressive and optimal medical therapy.    Family understands the situation.  They have consented and agreed to DNR/DNI Status Plan for end of life visitation policy, patient would NOT want TRACH/PEG tube   Family are satisfied with Plan of action and management. All questions answered  Additional CC time 35 mins   Jennavie Martinek Patricia Pesa, M.D.  [facility]  Medical Director [place] Director [facility] Cardio-Pulmonary Department

## 2020-08-19 NOTE — Progress Notes (Addendum)
22 Down to MRI for scan. Difficulty with the MRI  compatible IV pumps. 1051 Unable to pick up a oxygen saturation on any finger in MRI. After multiple attempts with different MRI compatible probes, ventilator FIO2 changed to 100% to proceed with MRI scan.1150 Back in room.

## 2020-08-19 NOTE — Progress Notes (Signed)
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 tach and patient was given 1 g calcium and 150 mg bolus of amiodarone.  08/12/2020 - 08/14/20 -Patient underwent back-to-back hemodialysis, ultimately on admission on 5/8 and then resumed his schedule, second HD on 5/9 -Cardiology was consulted due to atrial flutter, ?Vtach per EMS.  -HRstill uncontrolled, in atrial flutter, started amiodarone IV for rate control, possible cardioversion on 5/11 if patient continues to be tachycardiac  5/10 another round of HD provided, ECHO EF 25% 5/11s/p cardioversion 5/12discharged home  5/13 Patient reportedly was dialyzed earlier today, and then family found him down and pulseless this afternoonwhile in the bathroom,Family started CPR, then fire, then EMS reports an additional 10 minutes of CPR. Sounds like 20 minutes of total downtime. Initial rhythm was PEA. No shocks provided. 2 rounds of epi and King airway was placed in the field, replaced with ETT  5/13ICU team called for admission, plan for Hypothermia protocol if CT head is NEG  5/14 multiorgan failure, signs of brain damage, on hypothermia protocol  5/15 hypothermia protocol completed, +myoclonus   Micro Data:  COVID NEG INF A/B NEG    Interim History /  Subjective:  Remains intubated On pressors Critically ill High risk for death +mycolocun when sedation turned down MRI brain pending NEURO CONSULTATION pending       Objective   Blood pressure (!) 115/57, pulse 68, temperature 98.8 F (37.1 C), temperature source Bladder, resp. rate 15, height 5\' 10"  (1.778 m), weight 71.1 kg, SpO2 95 %.    Vent Mode: PRVC FiO2 (%):  [30 %-40 %] 30 % Set Rate:  [15 bmp] 15 bmp Vt Set:  [450 mL] 450 mL PEEP:  [5 cmH20] 5 cmH20 Plateau Pressure:  [11 cmH20] 11 cmH20   Intake/Output Summary (Last 24 hours) at 08/19/2020 0710 Last data filed at 08/19/2020 4098 Gross per 24 hour  Intake 1960.28 ml  Output 404 ml  Net 1556.28 ml   Filed Weights   08/22/2020 1548 08/16/2020 1822  Weight: 71 kg 71.1 kg      REVIEW OF SYSTEMS  PATIENT IS UNABLE TO PROVIDE COMPLETE REVIEW OF SYSTEMS DUE TO SEVERE CRITICAL ILLNESS AND TOXIC METABOLIC ENCEPHALOPATHY   PHYSICAL EXAMINATION:  GENERAL:critically ill appearing, +resp distress HEAD: Normocephalic, atraumatic.  EYES: Pupils equal, round, reactive to light.  No scleral icterus.  MOUTH: Moist mucosal membrane. NECK: Supple. PULMONARY: +rhonchi, +wheezing CARDIOVASCULAR: S1 and S2. Regular rate and rhythm. No murmurs, rubs, or gallops.  GASTROINTESTINAL: Soft, nontender, -distended. Positive bowel sounds.  MUSCULOSKELETAL: No swelling, clubbing, or edema.  NEUROLOGIC: obtunded SKIN:intact,warm,dry   Labs/imaging that I havepersonally reviewed  (right click and "Reselect all SmartList Selections" daily)      ASSESSMENT AND PLAN SYNOPSIS    65 yo white male with recent admission for hyperkalemia and WCT now with acute sudden cardiac arrest due to severe acute  systolic heart failure and acute ischemic cardiomyopathy with severe acidosis in setting of systolic CHF, ESRD on HD s/p AVR on Coumadin with toxic metabolic encephalopathy-signs of severe brain damage  Severe ACUTE Hypoxic and  Hypercapnic Respiratory Failure -continue Mechanical Ventilator support -continue Bronchodilator Therapy -Wean Fio2 and PEEP as tolerated -VAP/VENT bundle implementation    CARDIAC FAILURE-acute systolic dysfunction -oxygen as needed -Lasix as tolerated -follow up cardiac enzymes as indicated   CARDIAC ICU monitoring   END STAGE KIDNEY INJURY/Renal Failure -continue Foley Catheter-assess need -Avoid nephrotoxic agents -Follow urine output, BMP -Ensure adequate renal perfusion, optimize oxygenation -Renal dose medications HD/CRRT as needed   NEUROLOGY Acute toxic metabolic encephalopathy, need for sedation Goal RASS -2 to -3 Signs of severe brain damage Will obtain MRI brain and NEUROLOGY CONSULTATION   CARDIOGENIC SHOCK -use vasopressors to keep MAP>65 as needed  ENDO - ICU hypoglycemic\Hyperglycemia protocol -check FSBS per protocol   GI GI PROPHYLAXIS as indicated  NUTRITIONAL STATUS DIET-->NPO Constipation protocol as indicated   ELECTROLYTES -follow labs as needed -replace as needed -pharmacy consultation and following    Best practice (right click and "Reselect all SmartList Selections" daily)  Diet:NPO Pain/Anxiety/Delirium protocol (if indicated):Yes(RASS goal -2) VAP protocol (if indicated):Yes DVT prophylaxis:Contraindicated GI prophylaxis:PPI Glucose control:SSIYes Central venous access:Yes, and it is still needed Arterial line:Yes, and it is still needed Foley:N/A Mobility:bed rest PT consulted:N/A Code Status:full code Disposition:ICU    Labs   CBC: Recent Labs  Lab 08/12/20 0946 08/13/20 0510 08/15/20 0454 08/16/20 0425 08/13/2020 1520 08/18/20 0336 08/19/20 0338  WBC 13.4*   < > 5.7 5.6 12.2* 8.5 6.8  NEUTROABS 5.9  --   --   --  4.6  --   --   HGB 12.3*   < > 11.0* 10.9* 10.8* 10.6* 11.0*  HCT 42.2   < > 36.0* 35.2* 37.4* 36.0* 37.6*  MCV 108.2*   < > 102.6* 101.7* 106.3* 104.7* 105.0*  PLT  106*   < > 89* 79* 92* 96* 82*   < > = values in this interval not displayed.    Basic Metabolic Panel: Recent Labs  Lab 08/16/20 0425 09/02/2020 1520 08/19/2020 1838 08/18/20 0336 08/19/20 0338  NA 136 139 140 141  141 135  K 4.1 3.2* 3.2* 3.5  3.5 2.9*  CL 95* 94* 96* 98  98 95*  CO2 25 27 31 31  30 27   GLUCOSE 94 136* 130* 118*  115* 322*  BUN 30* 16 18 21  22  28*  CREATININE 5.28* 3.74* 3.78* 4.33*  4.42* 5.31*  CALCIUM 8.6* 8.1* 8.0* 8.4*  8.3* 8.1*  MG  --  2.1  --  1.8 2.1  PHOS  --   --   --  5.7* 5.9*   GFR: Estimated Creatinine Clearance: 14.3 mL/min (A) (by C-G formula based on SCr of 5.31 mg/dL (H)). Recent Labs  Lab 08/16/20 0425 08/19/2020 1520 08/16/2020 1838 08/18/20 0336 08/19/20 0338  PROCALCITON  --   --  5.64 8.23 13.21  WBC 5.6 12.2*  --  8.5 6.8  LATICACIDVEN  --  8.4* 2.5*  --   --     Liver Function Tests: Recent Labs  Lab 08/12/20 0946 08/15/20 0454 08/16/20 0425 09/03/2020 1520 08/18/20 0336  AST 105* 235* 138* 97* 82*  ALT 96* 291* 217* 153* 133*  ALKPHOS 179* 135* 133* 142* 126  BILITOT 2.0* 1.4* 1.5* 1.5* 1.8*  PROT 7.5 7.0 6.9 7.2 6.6  ALBUMIN 3.1* 2.9* 2.8* 2.8*  2.6*   No results for input(s): LIPASE, AMYLASE in the last 168 hours. No results for input(s): AMMONIA in the last 168 hours.  ABG    Component Value Date/Time   PHART 7.44 08/18/2020 0432   PCO2ART 50 (H) 08/18/2020 0432   PO2ART 139 (H) 08/18/2020 0432   HCO3 34.0 (H) 08/18/2020 0432   TCO2 29 07/13/2020 0851   ACIDBASEDEF 13.8 (H) 08/30/2020 1553   O2SAT 99.2 08/18/2020 0432     Coagulation Profile: Recent Labs  Lab 08/15/20 1505 08/16/20 0425 08/16/2020 1520 08/18/20 0336 08/19/20 0338  INR 4.1* 4.4* 3.6* 3.3* 2.6*    Cardiac Enzymes: No results for input(s): CKTOTAL, CKMB, CKMBINDEX, TROPONINI in the last 168 hours.  HbA1C: Hgb A1c MFr Bld  Date/Time Value Ref Range Status  08/18/2020 03:36 AM 5.4 4.8 - 5.6 % Final    Comment:    (NOTE) Pre  diabetes:          5.7%-6.4%  Diabetes:              >6.4%  Glycemic control for   <7.0% adults with diabetes   08/13/2020 05:10 AM 5.0 4.8 - 5.6 % Final    Comment:    (NOTE) Pre diabetes:          5.7%-6.4%  Diabetes:              >6.4%  Glycemic control for   <7.0% adults with diabetes     CBG: Recent Labs  Lab 08/19/20 0155 08/19/20 0221 08/19/20 0321 08/19/20 0421 08/19/20 0641  GLUCAP 59* 86 68* 140* 98    Allergies Allergies  Allergen Reactions  . Statins Other (See Comments)    Arthralgias (intolerance), Myalgias (intolerance)  Pt taking pravastatin   . Gabapentin     Drowsy   . Sertraline Rash       DVT/GI PRX  assessed I Assessed the need for Labs I Assessed the need for Foley I Assessed the need for Central Venous Line Family Discussion when available I Assessed the need for Mobilization I made an Assessment of medications to be adjusted accordingly Safety Risk assessment completed  CASE DISCUSSED IN MULTIDISCIPLINARY ROUNDS WITH ICU TEAM     Critical Care Time devoted to patient care services described in this note is 65  minutes.  Critical care was necessary to treat or prevent imminent or life-threatening deterioration. Overall, patient is critically ill, prognosis is guarded.  Patient with Multiorgan failure and at high risk for cardiac arrest and death.   PROGNOSIS IS VERY POOR VERY POOR CHANCE OF MEANINGFUL RECOVERY  Jeremy [name] Patricia Pesa, M.D.  [facility]  Medical Director [place] Director [facility] Cardio-Pulmonary Department

## 2020-08-19 NOTE — Progress Notes (Signed)
48 Family informed of patients poor prognosis by Dr Mortimer Fries and Dr. Concepcion Living Neurologist.

## 2020-08-19 NOTE — Progress Notes (Signed)
Patient unresponsive, propofol titrated off with no increase no change. Levophed increased to 61mcg, VS WDL. No urine output overnight, plans for decreased sedation today and CT of head. Patient significant other updated by phone x3, questions answered as asked.

## 2020-08-19 NOTE — Consult Note (Signed)
NEUROLOGY CONSULTATION NOTE   Date of service: Aug 19, 2020 Patient Name: Jeremy [name] MRN:  [id number] DOB:  [birth year] Reason for consult: prognostication after cardiac arrest _ _ _   _ __   _ __ _ _  __ __   _ __   __ _  History of Present Illness   65 yo white male with multiple medical issues,  AVR on COUMADIN, ESRD-HD, MWF status post mechanical AVR on Coumadin, atrial fibrillation, hypertension, hyperlipidemia, gout, RA, thrombocytopenia, OSA on CPAP, CAD, CABG, systolic CHF , PVD, anemia, renal cell CA, failed kidney transplantation p/w cardiac arrest 08/07/2020. Family started CPR, then fire, then EMS reports an additional 10 minutes of CPR. Sounds like 20 minutes of total downtime. Initial rhythm was PEA. No shocks provided. 2 rounds of epi and [place] airway was placed in the field, replaced with ETT. Underwent TTM, rewarmed 5/15. Had some isolated episodes of myoclonus per report but not continuous and not witnessed by ICU attending or bedside RN. Patient has no brainstem reflexes or response to noxious stimuli in any extremity on exam. MRI brain wo shows restricted diffusion in cortex and BG bilaterally c/w anoxic injury. Family states that patient would not want to be kept alive by machines if there were no hope of meaningful recovery.   ROS   UTA 2/2 comatose  Past History   Past Medical History:  Diagnosis Date  . Abnormal stress test 03/19/2018  . 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 Anxiety    situation  . Aortic stenosis    a. 2014 s/p mech AVR ([facility])-->chronic coumadin; b. 08/2018 Echo: Mild AS/AI. Nl fxn/gradient; c. 09/2019 Echo: EF 45-50%, AoV mean grad 44mHg.  . Arthritis   . CAD (coronary artery disease)    a. 2013 PCI: LAD [id number] Xience DES); b. 06/2018 Cath: LM min irregs, LAD 60p, 95/30/35m, LCX min irregs, RCA 40p, 7067m0d, RPAV 90, RPDA 95ost; d. 09/2018 CABGx3 (LIMA->LAD, VG->D1, VG->PDA); e. 07/2019 PCI: LAD 90p, 56m51mm 59m 35m, 51mills via L->L  collats, LCX mild diff dzs, RCA 40p, 79m, 979mRPAV 99, RPDA 95ost, VG->D2 100, LIMA->LAD ok, VG->RPDA 99 (4.5x15 Resolute Onyx DES).  . Chronic anticoagulation 06/2012   a. Coumadin in the setting of mech AVR and afib.  . Complication of anesthesia   . COVID-19 03/15/2019  . CPAP (continuous positive airway pressure) dependence   . Dialysis patient (HCC) 0[facility]/2008   Patient started HD around 1996,  had 1st transplant LLQ around 1998 until 2007 when they found renal cell cancer in transplant and both native kidneys, all were removed > went back on HD until 2nd transplant RLQ in 2014 which lasted 3 yrs; it was embolized for suspected acute rejection. [facility]  . 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 2014 Bilat nephrectomy (transplanted kidney resected) in the setting of renal cell carcinoma; d. PD until 11/2017-->HD.  . HeartMarland Kitchenmurmur   . HFrEF (heart failure with reduced ejection fraction) (HCC)   [place] 07/2016 Echo: EF 55-60%; b. 02/2018 MV: EF 30%; c. 04/2018 Echo: EF 35%; d. 08/2018 Echo: EF 30-35%. Glob HK w/o rwma; e. 09/2019 Echo: EF 45-50%, mod LVH, basal-mid inf/inflat HK, nl RV fxn, mod dil LA, mildly dil RA. Nl AoV prosthesis (mean grad 11mmHg)28m History of blood transfusion   .  Hx of colonoscopy    2009 by Dr. Laural Golden. Normal except for small submucosal lipoma. No evidence of polyps or colitis.  . Hyperlipidemia   . Hypertension   . Hypogonadism in male 07/25/2015  . Ischemic cardiomyopathy    a. 07/2016 Echo: EF 55-60%; b. 02/2018 MV: EF 30%; c. 04/2018 Echo: EF 35%; d. 08/2018 Echo: EF 30-35%; e.  09/2019 Echo: EF 45-50%, mod LVH, basal-mid inf/inflat HK.  . Myocardial infarction ([place])   . OSA (obstructive sleep apnea)    No CPAP  . Paroxysmal atrial flutter ([place])   . Peripheral vascular disease ([place])    a. 09/2019 s/p L SFA and L profunda stenting; b. 12/2019 s/p R  PT and TP trunk PTA; c. 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  Marland Kitchen Permanent atrial fibrillation (HCC)    a. CHA2DS2VASc = 3-->Coumadin (also mech AVR); b. 06/2018 Amio started 2/2 elevated rates.  Marland Kitchen PONV (postoperative nausea and vomiting)   . Renal cell carcinoma ([place]) 08/04/2016   Past Surgical History:  Procedure Laterality Date  . ABDOMINAL AORTOGRAM W/LOWER EXTREMITY N/A 12/22/2019   Procedure: ABDOMINAL AORTOGRAM W/LOWER EXTREMITY;  Surgeon: Marty Heck, MD;  Location: [place] [facility];  Service: Cardiovascular;  Laterality: N/A;  . AMPUTATION Right 04/14/2020   Procedure: RIGHT BELOW KNEE AMPUTATION;  Surgeon: Marty Heck, MD;  Location: [place];  Service: Vascular;  Laterality: Right;  . AORTIC VALVE REPLACEMENT  3 /11 /2014   At [place] Right 07/13/2020   Procedure: APPLICATION OF WOUND VAC;  Surgeon: Marty Heck, MD;  Location: [place];  Service: Vascular;  Laterality: Right;  . BACK SURGERY    . CAPD INSERTION N/A 02/19/2017   Procedure: LAPAROSCOPIC INSERTION CONTINUOUS AMBULATORY PERITONEAL DIALYSIS  (CAPD) CATHETER;  Surgeon: Algernon Huxley, MD;  Location: [facility];  Service: General;  Laterality: N/A;  . CARDIOVERSION N/A 08/15/2020   Procedure: CARDIOVERSION;  Surgeon: Kate Sable, MD;  Location: [facility];  Service: Cardiovascular;  Laterality: N/A;  . CORONARY ANGIOPLASTY WITH STENT PLACEMENT    . CORONARY ARTERY BYPASS GRAFT N/A 09/16/2018   Procedure: CORONARY ARTERY BYPASS GRAFT times three      with left internal mammary artery and endoharvest of right leg greater saphenous vein;  Surgeon: Melrose Nakayama, MD;  Location: [place];  Service: Open Heart Surgery;  Laterality: N/A;  . CORONARY STENT INTERVENTION N/A 07/15/2019   Procedure: CORONARY STENT INTERVENTION;  Surgeon: Wellington Hampshire, MD;  Location: [place] [facility];  Service: Cardiovascular;  Laterality: N/A;  . DG AV DIALYSIS  SHUNT ACCESS EXIST*L*  OR     left arm  . EPIGASTRIC HERNIA REPAIR N/A 02/19/2017   Procedure: HERNIA REPAIR EPIGASTRIC ADULT;  Surgeon: Robert Bellow, MD;  Location: [facility];  Service: General;  Laterality: N/A;  . EYE SURGERY     bilateral cataract  . HERNIA REPAIR     UMBILICAL HERNIA  . HERNIA REPAIR  02/19/2017  . INNER EAR SURGERY     20 years ago  . KIDNEY TRANSPLANT    . KNEE ARTHROSCOPY WITH MEDIAL MENISECTOMY Right 07/02/2020   Procedure: KNEE ARTHROSCOPY WITH INCISION AND DRAINAGE;  Surgeon: Leim Fabry, MD;  Location: [facility];  Service: Orthopedics;  Laterality: Right;  . LEFT HEART CATH AND CORS/GRAFTS ANGIOGRAPHY N/A 07/15/2019   Procedure: LEFT HEART CATH AND CORS/GRAFTS ANGIOGRAPHY;  Surgeon: Wellington Hampshire, MD;  Location: [place] [facility];  Service: Cardiovascular;  Laterality: N/A;  . LOWER EXTREMITY ANGIOGRAPHY Right 08/25/2019   Procedure: LOWER EXTREMITY ANGIOGRAPHY;  Surgeon: Algernon Huxley, MD;  Location: [place] [facility];  Service: Cardiovascular;  Laterality: Right;  . LOWER EXTREMITY ANGIOGRAPHY Left 09/29/2019   Procedure: Lower Extremity Angiography;  Surgeon: Algernon Huxley, MD;  Location: [place] [facility];  Service: Cardiovascular;  Laterality: Left;  . PERIPHERAL VASCULAR INTERVENTION Right 12/22/2019   Procedure: PERIPHERAL VASCULAR INTERVENTION;  Surgeon: Marty Heck, MD;  Location: [place] [facility];  Service: Cardiovascular;  Laterality: Right;  . Peritoneal Dialysis access placement  02/19/2017  . PSEUDOANERYSM COMPRESSION N/A 09/29/2019   Procedure: PSEUDOANERYSM COMPRESSION;  Surgeon: Algernon Huxley, MD;  Location: [place] [facility];  Service: Cardiovascular;  Laterality: N/A;  . REMOVAL OF A DIALYSIS CATHETER N/A 11/23/2017   Procedure: REMOVAL OF A PERITONEAL DIALYSIS CATHETER;  Surgeon: Algernon Huxley, MD;  Location: [facility];  Service: Vascular;  Laterality: N/A;  . RIGHT/LEFT HEART CATH AND CORONARY ANGIOGRAPHY Bilateral 06/07/2018   Procedure:  RIGHT/LEFT HEART CATH AND CORONARY ANGIOGRAPHY;  Surgeon: Wellington Hampshire, MD;  Location: [place] [facility];  Service: Cardiovascular;  Laterality: Bilateral;  . TEE WITHOUT CARDIOVERSION N/A 07/21/2016   Procedure: TRANSESOPHAGEAL ECHOCARDIOGRAM (TEE);  Surgeon: Wellington Hampshire, MD;  Location: [facility];  Service: Cardiovascular;  Laterality: N/A;  . TEE WITHOUT CARDIOVERSION N/A 09/16/2018   Procedure: TRANSESOPHAGEAL ECHOCARDIOGRAM (TEE);  Surgeon: Melrose Nakayama, MD;  Location: [place];  Service: Open Heart Surgery;  Laterality: N/A;  . WOUND DEBRIDEMENT Right 07/13/2020   Procedure: RIGHT BELOW KNEE AMPUTATION DEBRIDEMENT WITH [facility]IN [facility];  Surgeon: Marty Heck, MD;  [facility];  Service: Vascular;  Laterality: Right;   Family History  Problem Relation Age of Onset  . Cancer Mother        pancreatic  . Heart disease Father   . Diabetes Father    Social History   Socioeconomic History  . Marital status: Divorced    Spouse name: Not on file  . Number of children: 1  . Years of education: 62  . Highest education level: High school graduate  Occupational History  . Occupation: retired Forensic scientist -Disabled    Comment: Psychologist, educational  Tobacco Use  . Smoking status: Never Smoker  . Smokeless tobacco: Never Used  Vaping Use  . Vaping Use: Never used  Substance and Sexual Activity  . Alcohol use: Not Currently  . Drug use: No  . Sexual activity: Not Currently    Partners: Female  Other Topics Concern  . Not on file  Social History Narrative   Lives at home with girlfriend   1 daughter 79    Right handed    Social Determinants of Health   Financial Resource Strain: Not on file  Food Insecurity: Not on file  Transportation Needs: Not on file  Physical Activity: Not on file  Stress: Not on file  Social Connections: Not on file   Allergies  Allergen Reactions  . Statins Other (See Comments)    Arthralgias (intolerance), Myalgias  (intolerance)  Pt taking pravastatin   . Gabapentin     Drowsy   . Sertraline Rash    Medications   Medications Prior to Admission  Medication Sig Dispense Refill Last Dose  . acetaminophen (TYLENOL) 650 MG CR tablet Take 1,300 mg by mouth every 8 (eight) hours as needed for pain.     Marland Kitchen albuterol (VENTOLIN HFA) 108 (  90 Base) MCG/ACT inhaler Inhale 2 puffs into the lungs every 4 (four) hours as needed for wheezing or shortness of breath. 1 each 0   . allopurinol (ZYLOPRIM) 100 MG tablet Take 1 tablet (100 mg total) by mouth daily. 30 tablet 0   . amiodarone (PACERONE) 200 MG tablet Two tablets po twice a day for one week then one tablet twice a day afterwards 74 tablet 0   . Carbomer Gel Base (HYDROGEL) GEL Apply gel to graft site of right lower extremity 100 g 1   . cinacalcet (SENSIPAR) 30 MG tablet TAKE 1 TABLET BY MOUTH DAILY WITH FOOD (DO NOT TAKE LESS THAN 12 HOURS PRIOR TO DIALYSIS) (Patient not taking: Reported on 08/12/2020)     . clopidogrel (PLAVIX) 75 MG tablet Take 1 tablet (75 mg total) by mouth daily with breakfast. 90 tablet 3   . ezetimibe (ZETIA) 10 MG tablet TAKE 1 TABLET BY MOUTH EVERY DAY (Patient taking differently: Take 10 mg by mouth daily.) 90 tablet 1   . hydroxychloroquine (PLAQUENIL) 200 MG tablet Take 400 mg by mouth at bedtime.     . lidocaine-prilocaine (EMLA) cream Apply 1 application topically daily as needed (for fistula).     . loperamide (IMODIUM A-D) 2 MG tablet Take 2 mg by mouth 4 (four) times daily as needed for diarrhea or loose stools.     Marland Kitchen loratadine (CLARITIN) 10 MG tablet Take 10 mg by mouth daily.     . megestrol (MEGACE) 40 MG/ML suspension Take 400 mg by mouth daily.     . metoprolol succinate (TOPROL-XL) 25 MG 24 hr tablet Take 1 tablet (25 mg total) by mouth daily. 30 tablet 0   . midodrine (PROAMATINE) 10 MG tablet Take 1 tablet (10 mg total) by mouth 3 (three) times daily with meals. 90 tablet 0   . multivitamin (RENA-VIT) TABS tablet Take  1 tablet by mouth daily.     . nitroGLYCERIN (NITROSTAT) 0.4 MG SL tablet Place 1 tablet (0.4 mg total) under the tongue every 5 (five) minutes as needed for chest pain. 25 tablet 3   . oxyCODONE-acetaminophen (PERCOCET/ROXICET) 5-325 MG tablet Take 1 tablet by mouth every 4 (four) hours as needed. 30 tablet 0   . pantoprazole (PROTONIX) 40 MG tablet Take 1 tablet (40 mg total) by mouth daily. 90 tablet 3   . phenylephrine (,USE FOR PREPARATION-H,) 0.25 % suppository Place 1 suppository rectally 2 (two) times daily as needed for hemorrhoids.     . polyethylene glycol (MIRALAX / GLYCOLAX) 17 g packet Take 17 g by mouth daily as needed for mild constipation. 30 each 0   . rosuvastatin (CRESTOR) 40 MG tablet TAKE 1 TABLET BY MOUTH DAILY AT 6 PM 90 tablet 0   . sevelamer carbonate (RENVELA) 800 MG tablet Take 3,200 mg by mouth 3 (three) times daily with meals.     . traZODone (DESYREL) 50 MG tablet TAKE 1/2 -1 TABLET (25-50 MG TOTAL) BY MOUTH AT BEDTIME AS NEEDED FOR SLEEP. (Patient taking differently: Take 50 mg by mouth at bedtime as needed.) 90 tablet 2   . warfarin (COUMADIN) 1 MG tablet Take 1 tablet (1 mg total) by mouth every other day. 15 tablet 0      Vitals   Vitals:   08/19/20 1500 08/19/20 1521 08/19/20 1600 08/19/20 1700  BP: (!) 131/58  (!) 124/57 (!) 133/59  Pulse: 78  78 76  Resp: '15  17 16  ' Temp:  98.6 F (37 C)  TempSrc:      SpO2: 97% 100% 100% 100%  Weight:      Height:         Body mass index is 22.49 kg/m.  Physical Exam   Sedation paused x40 min prior to exam  Physical Exam Gen: comatose, intubated. No response to noxious stimuli, does not follow commands. Resp: ventilated CV: RRR  Neurologic GCS 3: Ev1 V1 M1 off sedation MS: comatose. No response to noxious stimuli, does not follow commands. Speech: intubated CN: Pupils 46m and unreactive bilat. (-) corneals, oculocephalics, gag, cough Motor & sensory: no response to noxious stimuli in BUE or LLE (RLE  amputation) Reflexes: 1+ in BUE, LLE. RLE not testable   Labs   CBC:  Recent Labs  Lab 08/08/2020 1520 08/18/20 0336 08/19/20 0338  WBC 12.2* 8.5 6.8  NEUTROABS 4.6  --   --   HGB 10.8* 10.6* 11.0*  HCT 37.4* 36.0* 37.6*  MCV 106.3* 104.7* 105.0*  PLT 92* 96* 82*    Basic Metabolic Panel:  Lab Results  Component Value Date   NA 135 08/19/2020   K 2.9 (L) 08/19/2020   CO2 27 08/19/2020   GLUCOSE 322 (H) 08/19/2020   BUN 28 (H) 08/19/2020   CREATININE 5.31 (H) 08/19/2020   CALCIUM 8.1 (L) 08/19/2020   GFRNONAA 11 (L) 08/19/2020   GFRAA 8 (L) 12/23/2019   Lipid Panel:  Lab Results  Component Value Date   LDLCALC 26 08/13/2020   HgbA1c:  Lab Results  Component Value Date   HGBA1C 5.4 08/18/2020   Urine Drug Screen: No results found for: LABOPIA, COCAINSCRNUR, LABBENZ, AMPHETMU, THCU, LABBARB  Alcohol Level No results found for: [facility]  Impression & Recommendations   65yo white male with multiple medical issues,  AVR on COUMADIN, ESRD-HD, MWF status post mechanical AVR on Coumadin, atrial fibrillation, hypertension, hyperlipidemia, gout, RA, thrombocytopenia, OSA on CPAP, CAD, CABG, systolic CHF , PVD, anemia, renal cell CA, failed kidney transplantation p/w cardiac arrest 08/18/2020, now s/p TTM. Exam off sedation shows no brainstem reflexes intact and no response to noxious stimuli in any extremity. MRI brain wo shows restricted diffusion in cortex and BG bilaterally c/w anoxic injury. I had a long discussion with family about [place] told them that given his exam and imaging findings I felt the chances of a meaningful recovery (with quality of life) would be extremely low. They stated that he would not want to be kept alive on machines if there were no chance of meaningful recovery. Afterwards they told Dr. KMortimer Friesthey wished to make him DNAR status. Family is coming to visit and they will make a decision regarding comfort care after that.  Per my discussion with Dr. KMortimer Fries neurology will not continue to actively follow given the status of current GPeculiardiscussions however please re-engage uKoreaif we can be of further assistance. ______________________________________________________________________   Thank you for the opportunity to take part in the care of this patient. If you have any further questions, please contact the neurology consultation attending.  Signed,  CSu Monks MD [facility] [phone] 7pm- 7am, please page neurology on call as listed in [place]  This patient is critically ill and at significant risk of neurological worsening, death and care requires constant monitoring of vital signs, hemodynamics,respiratory and cardiac monitoring, neurological assessment, discussion with family, other specialists and medical decision making of high complexity. I spent 90 minutes of neurocritical care time  in  the care of  this patient. This was time spent independent of any time provided by nurse practitioner or PA.  Su Monks, MD [facility] [phone]  If 7pm- 7am, please page neurology on call as listed in [place].

## 2020-08-19 NOTE — Progress Notes (Signed)
[facility]  ROUNDING NOTE   Subjective:   [name] was admitted to [facility] on 08/05/2020 for Cardiac arrest [facility] [I46.9]  Discharged to home on 5/12 and family found him down with PEA. Family started CPR and transferred to the ED.   Patient underwent cardioversion on 5/11. Previous admission for Acute on Ch Sys CHF, SVT chronic A Flutter,  Non healing Rt BKA  Patient's daughter and Significant other at bedside  Patient remains intubated , vent assisted Sedated with propofol, fentanyl Requiring Norepi, vasopressin Fio2 40%  family reports seizures when propofol was turned off MRI scheduled for this AM  Objective:  Vital signs in last 24 hours:  Temp:  [96.6 F (35.9 C)-99.5 F (37.5 C)] 98.8 F (37.1 C) (05/15 0600) Pulse Rate:  [52-74] 68 (05/15 0800) Resp:  [12-34] 12 (05/15 0800) BP: (96-125)/(53-78) 116/53 (05/15 0800) SpO2:  [94 %-100 %] 94 % (05/15 0801) Arterial Line BP: (92-133)/(32-52) 108/32 (05/15 0800) FiO2 (%):  [30 %-40 %] 40 % (05/15 0801)  Weight change:  Filed Weights   08/28/2020 1548 08/21/2020 1822  Weight: 71 kg 71.1 kg    Intake/Output: I/O last 3 completed shifts: In: 2478.3 [I.V.:2139.8; NG/GT:100; IV Piggyback:238.5] Out: 404 [Urine:4; Emesis/NG output:400]   Intake/Output this shift:  No intake/output data recorded.  Physical Exam: General: Critically ill  Head: ETT   Eyes: closed  Lungs:  Diminished bilaterally, vent assisted  Heart: Irregular, +murmur  Abdomen:  Soft   Extremities:  no edema, Right BKA with clean dressings.   Neurologic: Intubated and sedated  Skin: No lesions  Access: Left AVF    Basic Metabolic Panel: Recent Labs  Lab 08/16/20 0425 08/23/2020 1520 08/30/2020 1838 08/18/20 0336 08/19/20 0338  NA 136 139 140 141  141 135  K 4.1 3.2* 3.2* 3.5  3.5 2.9*  CL 95* 94* 96* 98  98 95*  CO2 _0 GLUCOSE 94 136* 130* 118*  115* 322*  BUN 30* _1 28*  CREATININE 5.28*  3.74* 3.78* 4.33*  4.42* 5.31*  CALCIUM 8.6* 8.1* 8.0* 8.4*  8.3* 8.1*  MG  --  2.1  --  1.8 2.1  PHOS  --   --   --  5.7* 5.9*    Liver Function Tests: Recent Labs  Lab 08/12/20 0946 08/15/20 0454 08/16/20 0425 08/12/2020 1520 08/18/20 0336  AST 105* 235* 138* 97* 82*  ALT 96* 291* 217* 153* 133*  ALKPHOS 179* 135* 133* 142* 126  BILITOT 2.0* 1.4* 1.5* 1.5* 1.8*  PROT 7.5 7.0 6.9 7.2 6.6  ALBUMIN 3.1* 2.9* 2.8* 2.8* 2.6*   No results for input(s): LIPASE, AMYLASE in the last 168 hours. No results for input(s): AMMONIA in the last 168 hours.  CBC: Recent Labs  Lab 08/12/20 0946 08/13/20 0510 08/15/20 0454 08/16/20 0425 08/09/2020 1520 08/18/20 0336 08/19/20 0338  WBC 13.4*   < > 5.7 5.6 12.2* 8.5 6.8  NEUTROABS 5.9  --   --   --  4.6  --   --   HGB 12.3*   < > 11.0* 10.9* 10.8* 10.6* 11.0*  HCT 42.2   < > 36.0* 35.2* 37.4* 36.0* 37.6*  MCV 108.2*   < > 102.6* 101.7* 106.3* 104.7* 105.0*  PLT 106*   < > 89* 79* 92* 96* 82*   < > = values in this interval not displayed.    Cardiac Enzymes: No results for input(s):  CKTOTAL, CKMB, CKMBINDEX, TROPONINI in the last 168 hours.  BNP: Invalid input(s): POCBNP  CBG: Recent Labs  Lab 08/19/20 0221 08/19/20 0321 08/19/20 0421 08/19/20 0641 08/19/20 0757  GLUCAP 86 68* 140* 98 88    Microbiology: Results for orders placed or performed during the hospital encounter of 08/24/2020  Resp Panel by RT-PCR (Flu A&B, Covid) Nasopharyngeal Swab     Status: None   Collection Time: 08/25/2020  3:34 PM   Specimen: Nasopharyngeal Swab; Nasopharyngeal(NP) swabs in vial transport medium  Result Value Ref Range Status   SARS Coronavirus 2 by RT PCR NEGATIVE NEGATIVE Final    Comment: (NOTE) SARS-CoV-2 target nucleic acids are NOT DETECTED.  The SARS-CoV-2 RNA is generally detectable in upper respiratory specimens during the acute phase of infection. The lowest concentration of SARS-CoV-2 viral copies this assay can detect is 138  copies/mL. A negative result does not preclude SARS-Cov-2 infection and should not be used as the sole basis for treatment or other patient management decisions. A negative result may occur with  improper specimen collection/handling, submission of specimen other than nasopharyngeal swab, presence of viral mutation(s) within the areas targeted by this assay, and inadequate number of viral copies(<138 copies/mL). A negative result must be combined with clinical observations, patient history, and epidemiological information. The expected result is Negative.  Fact Sheet for Patients:  EntrepreneurPulse.com.au  Fact Sheet for Healthcare Providers:  IncredibleEmployment.be  This test is no t yet approved or cleared by the [place] FDA and  has been authorized for detection and/or diagnosis of SARS-CoV-2 by FDA under an Emergency Use Authorization (EUA). This EUA will remain  in effect (meaning this test can be used) for the duration of the COVID-19 declaration under Section 564(b)(1) of the Act, 21 U.S.C.section 360bbb-3(b)(1), unless the authorization is terminated  or revoked sooner.       Influenza A by PCR NEGATIVE NEGATIVE Final   Influenza B by PCR NEGATIVE NEGATIVE Final    Comment: (NOTE) The Xpert Xpress SARS-CoV-2/FLU/RSV plus assay is intended as an aid in the diagnosis of influenza from Nasopharyngeal swab specimens and should not be used as a sole basis for treatment. Nasal washings and aspirates are unacceptable for Xpert Xpress SARS-CoV-2/FLU/RSV testing.  Fact Sheet for Patients: EntrepreneurPulse.com.au  Fact Sheet for Healthcare Providers: IncredibleEmployment.be  This test is not yet approved or cleared by the [place] FDA and has been authorized for detection and/or diagnosis of SARS-CoV-2 by FDA under an Emergency Use Authorization (EUA). This EUA will remain in effect (meaning  this test can be used) for the duration of the COVID-19 declaration under Section 564(b)(1) of the Act, 21 U.S.C. section 360bbb-3(b)(1), unless the authorization is terminated or revoked.  Performed at [facility], [address] [place], [place] [address]   MRSA PCR Screening     Status: None   Collection Time: 09/02/2020  6:38 PM   Specimen: Nasopharyngeal  Result Value Ref Range Status   MRSA by PCR NEGATIVE NEGATIVE Final    Comment:        The GeneXpert MRSA Assay (FDA approved for NASAL specimens only), is one component of a comprehensive MRSA colonization surveillance program. It is not intended to diagnose MRSA infection nor to guide or monitor treatment for MRSA infections. Performed at [facility], [address] [place], [place] [address]   CULTURE, BLOOD (ROUTINE X 2) w Reflex to ID Panel     Status: None (Preliminary result)   Collection Time: 08/23/2020  7:07 PM   Specimen: BLOOD  Result Value Ref Range Status   Specimen Description BLOOD LEFT ANTECUBITAL  Final   Special Requests   Final    BOTTLES DRAWN AEROBIC AND ANAEROBIC Blood Culture adequate volume   Culture   Final    NO GROWTH 2 DAYS Performed at [facility], [address], [place], [place] [address]    Report Status PENDING  Incomplete  CULTURE, BLOOD (ROUTINE X 2) w Reflex to ID Panel     Status: None (Preliminary result)   Collection Time: 08/16/2020  7:08 PM   Specimen: BLOOD  Result Value Ref Range Status   Specimen Description BLOOD BLOOD LEFT HAND  Final   Special Requests   Final    BOTTLES DRAWN AEROBIC AND ANAEROBIC Blood Culture adequate volume   Culture   Final    NO GROWTH 2 DAYS Performed at [facility], [address], [place], [place] [address]    Report Status PENDING  Incomplete   *Note: Due to a large number of results and/or encounters for the requested time period, some results have not been displayed. A complete set of results can  be found in Results Review.    Coagulation Studies: Recent Labs    08/05/2020 1520 08/18/20 0336 08/19/20 0338  LABPROT 36.2* 33.5* 27.8*  INR 3.6* 3.3* 2.6*    Urinalysis: Recent Labs    09/03/2020 1621  COLORURINE AMBER*  LABSPEC 1.030  PHURINE 9.0*  GLUCOSEU 150*  HGBUR SMALL*  BILIRUBINUR NEGATIVE  KETONESUR NEGATIVE  PROTEINUR >=300*  NITRITE NEGATIVE  LEUKOCYTESUR SMALL*      Imaging: DG Abd 1 View  Result Date: 08/19/2020 CLINICAL DATA:  Status post OG tube placement EXAM: ABDOMEN - 1 VIEW COMPARISON:  08/18/2020 FINDINGS: The enteric tube tip projects over the body of stomach. Side port is below the level of the GE junction. No dilated bowel loops identified. Calcified renal graft and endovascular coils are noted within the right lower quadrant of the abdomen. Aortic atherosclerosis. IMPRESSION: Satisfactory position of enteric tube with tip in the body of stomach. Electronically Signed   By: Kerby Moors M.D.   On: 08/19/2020 08:05   CT HEAD WO CONTRAST  Result Date: 08/28/2020 CLINICAL DATA:  Mental status changes, found unresponsive. EXAM: CT HEAD WITHOUT CONTRAST TECHNIQUE: Contiguous axial images were obtained from the base of the skull through the vertex without intravenous contrast. COMPARISON:  None. FINDINGS: Brain: No acute intracranial abnormality. Specifically, no hemorrhage, hydrocephalus, mass lesion, acute infarction, or significant intracranial injury. Vascular: No hyperdense vessel or unexpected calcification. Skull: No acute calvarial abnormality. Sinuses/Orbits: Mucosal thickening in the ethmoid air cells. No air-fluid levels. Other: None IMPRESSION: No acute intracranial abnormality. Electronically Signed   By: Rolm Baptise M.D.   On: 08/28/2020 17:10   DG Chest Port 1 View  Result Date: 08/19/2020 CLINICAL DATA:  Status post intubation.  Evaluate OG tube placement. EXAM: PORTABLE CHEST 1 VIEW COMPARISON:  08/18/2020 FINDINGS: ET tube tip is in  satisfactory position above the carina. The nasogastric tube and side port are below the level of the GE junction. Previous median sternotomy and CABG procedure. Stable cardiac enlargement. Mild diffuse edema. New opacity is identified within the right lung base which may represent atelectasis or pneumonia. IMPRESSION: Satisfactory position of ET tube and nasogastric tube. New opacity within the medial right lung base which may represent atelectasis or pneumonia. Electronically Signed   By: Kerby Moors M.D.   On: 08/19/2020  08:04   DG Chest Port 1 View  Result Date: 08/18/2020 CLINICAL DATA:  Endotracheal tube placement EXAM: PORTABLE CHEST 1 VIEW COMPARISON:  08/11/2020 FINDINGS: ET tube tip is above the carina. There is an NG tube with side port below GE junction. Previous median sternotomy and CABG procedure. Stable cardiac enlargement. Mild diffuse interstitial edema. IMPRESSION: 1. Mild interstitial edema 2. Stable position of ET tube with tip above carina. Electronically Signed   By: Kerby Moors M.D.   On: 08/18/2020 08:07   DG Chest Portable 1 View  Result Date: 09/04/2020 CLINICAL DATA:  Status post intubation EXAM: PORTABLE CHEST 1 VIEW COMPARISON:  Chest x-ray from earlier same day. FINDINGS: Endotracheal tube is now positioned just below the level of the clavicles, approximately 5 cm above the carina. Enteric tube passes below the diaphragm. Stable cardiomegaly. Stable central pulmonary vascular congestion and interstitial prominence. No pleural effusion or pneumothorax is seen. IMPRESSION: 1. Endotracheal tube now positioned with tip just below the level of the clavicles, approximately 5 cm above the carina. 2. Central pulmonary vascular congestion and probable bilateral interstitial edema. Electronically Signed   By: Franki Cabot M.D.   On: 08/23/2020 15:54   DG Chest Portable 1 View  Result Date: 08/13/2020 CLINICAL DATA:  Hypoxia EXAM: PORTABLE CHEST 1 VIEW COMPARISON:  Aug 12, 2020  FINDINGS: Endotracheal tube tip is 3.1 cm above the carina. Nasogastric tube side port seen in stomach with tip below diaphragm, not seen. No pneumothorax. There is cardiomegaly with pulmonary venous hypertension. There is interstitial prominence, likely due to a degree of interstitial pulmonary edema. No airspace opacity. No pleural effusions evident. Patient is status post coronary artery bypass grafting. There is aortic atherosclerosis. No evident adenopathy. Old healed fracture right posterior sixth rib. IMPRESSION: Tube positions as described without pneumothorax. Cardiomegaly with pulmonary vascular congestion. Suspect mild interstitial edema. No consolidation. Status post coronary artery bypass grafting. Aortic Atherosclerosis (ICD10-I70.0). Electronically Signed   By: Lowella Grip III M.D.   On: 08/12/2020 15:44     Medications:   . sodium chloride Stopped (08/18/20 2117)  . dextrose 30 mL/hr at 08/19/20 0626  . fentaNYL infusion INTRAVENOUS 100 mcg/hr (08/19/20 0626)  . levETIRAcetam 1,000 mg (08/19/20 0824)  . levETIRAcetam 500 mg (08/19/20 0824)  . norepinephrine (LEVOPHED) Adult infusion 18 mcg/min (08/19/20 0756)  . propofol (DIPRIVAN) infusion 20 mcg/kg/min (08/19/20 0626)  . vasopressin 0.04 Units/min (08/19/20 0626)   . chlorhexidine gluconate (MEDLINE KIT)  15 mL Mouth Rinse BID  . Chlorhexidine Gluconate Cloth  6 each Topical Daily  . docusate  100 mg Per Tube BID  . insulin aspart  0-6 Units Subcutaneous Q4H  . mouth rinse  15 mL Mouth Rinse 10 times per day  . pantoprazole (PROTONIX) IV  40 mg Intravenous QHS  . polyethylene glycol  17 g Per Tube Daily   fentaNYL, levETIRAcetam, midazolam, midazolam  Assessment/ Plan:  [name] is a 65 y.o. 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 End Stage Renal Disease completed hemodialysis Friday AM. No indication for dialysis today.  - Monitor daily for  dialysis need.  - Next HD tentatively scheduled for Monday   2. Hypotension in setting of recent cardiogenic shock, PEA arrest and atrial fibrillation with rapid ventricular rhythm.  Status post DC cardioversion on 5/11 by Dr. Garen Lah.  - norepinephrine  3. Anemia of chronic kidney disease: Lab Results  Component Value Date   HGB 11.0 (L) 08/19/2020     -Retacrit as outpatient.   4. Acute respiratory failure:  requiring intubation and mechanical ventilation.    LOS: 2 Lorely Bubb 5/15/20229:38 AM

## 2020-08-20 ENCOUNTER — Telehealth: Payer: Medicare Other

## 2020-08-20 ENCOUNTER — Ambulatory Visit: Payer: Medicare Other | Admitting: *Deleted

## 2020-08-20 DIAGNOSIS — I5020 Unspecified systolic (congestive) heart failure: Secondary | ICD-10-CM

## 2020-08-20 DIAGNOSIS — I469 Cardiac arrest, cause unspecified: Secondary | ICD-10-CM

## 2020-08-20 DIAGNOSIS — G931 Anoxic brain damage, not elsewhere classified: Secondary | ICD-10-CM

## 2020-08-20 DIAGNOSIS — I472 Ventricular tachycardia, unspecified: Secondary | ICD-10-CM

## 2020-08-20 LAB — CBC
HCT: 37 % — ABNORMAL LOW (ref 39.0–52.0)
Hemoglobin: 11.4 g/dL — ABNORMAL LOW (ref 13.0–17.0)
MCH: 31.1 pg (ref 26.0–34.0)
MCHC: 30.8 g/dL (ref 30.0–36.0)
MCV: 101.1 fL — ABNORMAL HIGH (ref 80.0–100.0)
Platelets: 90 10*3/uL — ABNORMAL LOW (ref 150–400)
RBC: 3.66 MIL/uL — ABNORMAL LOW (ref 4.22–5.81)
RDW: 17.1 % — ABNORMAL HIGH (ref 11.5–15.5)
WBC: 3.1 10*3/uL — ABNORMAL LOW (ref 4.0–10.5)
nRBC: 0.6 % — ABNORMAL HIGH (ref 0.0–0.2)

## 2020-08-20 LAB — BLOOD GAS, ARTERIAL
Acid-base deficit: 2.9 mmol/L — ABNORMAL HIGH (ref 0.0–2.0)
Bicarbonate: 25 mmol/L (ref 20.0–28.0)
FIO2: 1
MECHVT: 450 mL
Mechanical Rate: 15
O2 Saturation: 97 %
PEEP: 8 cmH2O
Patient temperature: 37
pCO2 arterial: 57 mmHg — ABNORMAL HIGH (ref 32.0–48.0)
pH, Arterial: 7.25 — ABNORMAL LOW (ref 7.350–7.450)
pO2, Arterial: 104 mmHg (ref 83.0–108.0)

## 2020-08-20 LAB — URINE CULTURE: Culture: NO GROWTH

## 2020-08-20 LAB — BASIC METABOLIC PANEL
Anion gap: 18 — ABNORMAL HIGH (ref 5–15)
BUN: 35 mg/dL — ABNORMAL HIGH (ref 8–23)
CO2: 23 mmol/L (ref 22–32)
Calcium: 7.8 mg/dL — ABNORMAL LOW (ref 8.9–10.3)
Chloride: 91 mmol/L — ABNORMAL LOW (ref 98–111)
Creatinine, Ser: 6.14 mg/dL — ABNORMAL HIGH (ref 0.61–1.24)
GFR, Estimated: 10 mL/min — ABNORMAL LOW (ref >60)
Glucose, Bld: 104 mg/dL — ABNORMAL HIGH (ref 70–99)
Potassium: 4.1 mmol/L (ref 3.5–5.1)
Sodium: 132 mmol/L — ABNORMAL LOW (ref 135–145)

## 2020-08-20 LAB — GLUCOSE, CAPILLARY
Glucose-Capillary: 84 mg/dL (ref 70–99)
Glucose-Capillary: 94 mg/dL (ref 70–99)
Glucose-Capillary: 82 mg/dL (ref 70–99)
Glucose-Capillary: 78 mg/dL (ref 70–99)

## 2020-08-20 LAB — PHOSPHORUS: Phosphorus: 7.4 mg/dL — ABNORMAL HIGH (ref 2.5–4.6)

## 2020-08-20 LAB — MAGNESIUM: Magnesium: 1.9 mg/dL (ref 1.7–2.4)

## 2020-08-20 MED ORDER — VALPROIC ACID 250 MG/5ML PO SOLN
250.0000 mg | Freq: Four times a day (QID) | ORAL | Status: DC
  Administered 2020-08-20: 250 mg
  Filled 2020-08-20 (×5): qty 5

## 2020-08-20 MED ORDER — MORPHINE 100MG IN NS 100ML (1MG/ML) PREMIX INFUSION
1.0000 mg/h | INTRAVENOUS | Status: DC
  Administered 2020-08-20: 10 mg/h via INTRAVENOUS
  Filled 2020-08-20: qty 100

## 2020-08-21 ENCOUNTER — Ambulatory Visit: Payer: Medicare Other

## 2020-08-23 ENCOUNTER — Ambulatory Visit: Payer: Medicare Other | Admitting: Adult Health

## 2020-08-24 LAB — CULTURE, BLOOD (ROUTINE X 2)
Culture: NO GROWTH
Culture: NO GROWTH
Special Requests: ADEQUATE
Special Requests: ADEQUATE

## 2020-08-28 ENCOUNTER — Ambulatory Visit: Payer: Medicare Other | Admitting: Physician Assistant

## 2020-08-28 ENCOUNTER — Ambulatory Visit: Payer: Medicare Other | Admitting: Family Medicine

## 2020-08-29 ENCOUNTER — Other Ambulatory Visit: Payer: Self-pay | Admitting: Family Medicine

## 2020-09-04 ENCOUNTER — Ambulatory Visit: Payer: Medicare Other

## 2020-09-04 ENCOUNTER — Other Ambulatory Visit: Payer: Self-pay | Admitting: Cardiovascular Disease

## 2020-09-05 NOTE — Progress Notes (Signed)
NAME:  Jeremy [name], MRN:  [id number], DOB:  [birth year], LOS: 3 ADMISSION DATE:  08/29/2020, CONSULTATION DATE:  08/11/2020 REFERRING MD:  Dr. Tamala Julian , CHIEF COMPLAINT:  Cardiac arrest  History of Present Illness:  65 y.o. Male with extensive past medical history as listed below who is admitted to ICU following out of hospital PEA cardiac arrest (etiology of cardiac arrest not entirely clear) and Cardiogenic shock. Completed Targeted Temperature Management @ 36 C. Now with concern for severe Anoxic Brain Injury and Myoclonus.  Of note he was recently admitted from 08/13/20 through 08/16/20 for syncope, wide complex tachycardia, and hyperkalemia.  He required Amiodarone for rate control, emergent Hemodialysis, and Cardioversion on 08/15/20.  Pertinent  Medical History  Aortic Valve Replacement on Coumadin Atrial Fibrillation HFrEF CAD s/p CABG Hypertension Hyperlipidemia OSA on CPAP ESRD on HD (M,W,F) Gout  Rheumatoid Arthritis Thrombocytopenia Anemia Renal Cell Cancer, failed kidney transplant  Significant Hospital Events: Including procedures, antibiotic start and stop dates in addition to other pertinent events    5/13Patient reportedly was dialyzed earlier today, and then family found him down and pulseless this afternoonwhile in the bathroom,Family started CPR, then fire, then EMS reports an additional 10 minutes of CPR. Sounds like 20 minutes of total downtime. Initial rhythm was PEA. No shocks provided. 2 rounds of epi and King airway was placed in the field, replaced with ETT  5/13ICU team called for admission, plan for Hypothermia protocol if CT head is NEG  5/14 multiorgan failure, signs of brain damage, on hypothermia protocol  5/15 hypothermia protocol completed, +myoclonus  5/16: MRI yesterday concerning for global anoxic brain injury. Palliative Care consulted.  Critically ill, poor prognosis  Interim History / Subjective:  -Myoclonus of chin/mouth -Off  sedation, unresponsive, no cough/gag/corneal reflexes -MRI Brain yesterday 08/19/20 with  Cortical and basal ganglia restricted diffusion that correlates with history of global anoxic injury. No signs of elevated intracranial pressure -Requiring Levophed, Vasopressin -Palliative Care consulted -Family is considering Comfort Care  Objective   Blood pressure (!) 110/50, pulse 88, temperature 98.4 F (36.9 C), temperature source Rectal, resp. rate (!) 26, height '5\' 10"'  (1.778 m), weight 71.1 kg, SpO2 91 %.    Vent Mode: PRVC FiO2 (%):  [30 %-100 %] 100 % Set Rate:  [15 bmp-18 bmp] 18 bmp Vt Set:  [450 mL-500 mL] 500 mL PEEP:  [5 cmH20-8 cmH20] 8 cmH20 Plateau Pressure:  [13 cmH20-22 cmH20] 22 cmH20   Intake/Output Summary (Last 24 hours) at Sep 11, 2020 0914 Last data filed at 2020/09/11 0600 Gross per 24 hour  Intake 2550.76 ml  Output --  Net 2550.76 ml   Filed Weights   08/25/2020 1548 08/12/2020 1822  Weight: 71 kg 71.1 kg    Examination: General: Critically ill appearing male, laying in bed, intubated, unresponsive on no sedation HENT: Atraumatic, normocephalic, neck supple, no JVD Lungs: Mechanical breath sounds bilaterally, synchronous with vent, even Cardiovascular: Irregular rhythm,  Abdomen: Soft, nontender, nondistended, no guarding or rebound tenderness, BS+ x4 Extremities: 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 6.14, AG 18, Phosphorus 7.4, WBC 3.1, Hgb 11.4, platelets 90 ABG: pH 7.25/pCO2 57/ pO2 102/ Bicarb 25 CXR 5/15: ET tube tip is in satisfactory position above the carina. The  nasogastric tube and side port are below the level of the GE junction. Previous median sternotomy  and CABG procedure. Stable cardiac enlargement. Mild diffuse edema. New opacity is identified within the right lung base which may represent atelectasis or Pneumonia. MRI Brain 5/15>>1. Cortical and basal ganglia restricted diffusion that correlates with history of global anoxic injury. 2. No signs of elevated intracranial pressure.  Resolved Hospital Problem list     Assessment & Plan:   PEA Cardiac Arrest, exact etiology not entirely clear (? electrolyte abnormality vs global cardiac ischemia in setting of severely reduced LVEF) Shock, suspect Cardiogenic PMHx of CAD s/p CABG, mechanical aortic valve replacement -Continuous cardiac monitoring -Maintain MAP >65 -Vasopressors as needed to maintain MAP goal -Cardiology following, appreciate input -Echocardiogram on 08/14/20 with LVEF 25-30%  Acute Hypoxic Hypercapnic Respiratory Failure in the setting of Cardiac arrest -Full vent support, implement lung protective strategies -Wean FiO2 & PEEP as tolerated to maintain O2 sats >90% -Follow intermittent CXR & ABG as needed -SBT's when respiratory parameters met and if mental status permits -Implement VAP bundle -PRN bronchodilators -Volume removal with HD  ESRD on Hemodialysis -Monitor I&O's / urinary output -Follow BMP -Ensure adequate renal perfusion -Avoid nephrotoxic agents as able -Replace electrolytes as indicated -Nephrology following, appreciate input -HD vs. CRRT as per Nephrology  Acute Toxic Metabolic Encephalopathy Concern for Anoxic Brain Injury Myoclonus -Currently unresponsive on no sedation, no cough/gag/corneal reflexes -S/p Targeted Temperature Management, completed on 5/14 -Neurology following, appreciate input -MRI Brain 08/19/20 with Cortical and basal ganglia restricted diffusion that correlates with history of global anoxic injury. No signs of elevated intracranial pressure -Continue Keppra and Valproic Acid -Prn Ativan  Leukocytopenia -Monitor  fever curve -Trend WBC's -Chest X-ray on 5/15 with new opacity in the Right medial lung base concerning for pneumonia vs. Atelectasis -If family wishes to purse aggressive care, will start ABX   Pt is critically ill, prognosis is guarded. High risk for cardiac arrest and death. Palliative Care is consulted.  Pt with very low chance of meaningful Neurological recovery. Recommend comfort care measures.   Best practice (right click and "Reselect all SmartList Selections" daily)  Diet:  NPO Pain/Anxiety/Delirium protocol (if indicated): No VAP protocol (if indicated): Yes DVT prophylaxis: SCD GI prophylaxis: PPI Glucose control:  SSI Yes and Basal insulin Yes Central venous access:  Yes, and it is still needed Arterial line:  Yes, and it is still needed Foley:  Yes, and it is still needed Mobility:  bed rest  PT consulted: N/A Last date of multidisciplinary goals of care discussion [Aug 27, 2020] Code Status:  DNR Disposition: ICU  Labs   CBC: Recent Labs  Lab 08/16/20 0425 08/08/2020 1520 08/18/20 0336 08/19/20 0338 08/27/2020 0452  WBC 5.6 12.2* 8.5 6.8 3.1*  NEUTROABS  --  4.6  --   --   --   HGB 10.9* 10.8* 10.6* 11.0* 11.4*  HCT 35.2* 37.4* 36.0* 37.6* 37.0*  MCV 101.7* 106.3* 104.7* 105.0* 101.1*  PLT 79* 92* 96* 82* 90*    Basic Metabolic Panel: Recent Labs  Lab 08/12/2020 1520 08/22/2020 1838 08/18/20 0336 08/19/20 0338 08-27-2020 0452  NA 139 140 141  141 135 132*  K 3.2* 3.2* 3.5  3.5 2.9* 4.1  CL 94* 96* 98  98 95* 91*  CO2 '27 31 31  30 27 23  ' GLUCOSE 136* 130* 118*  115* 322* 104*  BUN '16 18 21  22 ' 28* 35*  CREATININE 3.74* 3.78* 4.33*  4.42* 5.31* 6.14*  CALCIUM 8.1* 8.0* 8.4*  8.3* 8.1* 7.8*  MG 2.1  --  1.8 2.1 1.9  PHOS  --   --  5.7* 5.9* 7.4*   GFR: Estimated Creatinine Clearance: 12.4 mL/min (A) (by C-G formula based on SCr of 6.14 mg/dL (H)). Recent Labs  Lab 08/06/2020 1520 08/07/2020 1838 08/18/20 0336 08/19/20 0338 2020/08/25 0452   PROCALCITON  --  5.64 8.23 13.21  --   WBC 12.2*  --  8.5 6.8 3.1*  LATICACIDVEN 8.4* 2.5*  --   --   --     Liver Function Tests: Recent Labs  Lab 08/15/20 0454 08/16/20 0425 08/28/2020 1520 08/18/20 0336  AST 235* 138* 97* 82*  ALT 291* 217* 153* 133*  ALKPHOS 135* 133* 142* 126  BILITOT 1.4* 1.5* 1.5* 1.8*  PROT 7.0 6.9 7.2 6.6  ALBUMIN 2.9* 2.8* 2.8* 2.6*   No results for input(s): LIPASE, AMYLASE in the last 168 hours. No results for input(s): AMMONIA in the last 168 hours.  ABG    Component Value Date/Time   PHART 7.25 (L) 08/25/20 0340   PCO2ART 57 (H) Aug 25, 2020 0340   PO2ART 104 Aug 25, 2020 0340   HCO3 25.0 2020/08/25 0340   TCO2 29 07/13/2020 0851   ACIDBASEDEF 2.9 (H) Aug 25, 2020 0340   O2SAT 97.0 08-25-20 0340     Coagulation Profile: Recent Labs  Lab 08/15/20 1505 08/16/20 0425 08/16/2020 1520 08/18/20 0336 08/19/20 0338  INR 4.1* 4.4* 3.6* 3.3* 2.6*    Cardiac Enzymes: No results for input(s): CKTOTAL, CKMB, CKMBINDEX, TROPONINI in the last 168 hours.  HbA1C: Hgb A1c MFr Bld  Date/Time Value Ref Range Status  08/18/2020 03:36 AM 5.4 4.8 - 5.6 % Final    Comment:    (NOTE) Pre diabetes:          5.7%-6.4%  Diabetes:              >6.4%  Glycemic control for   <7.0% adults with diabetes   08/13/2020 05:10 AM 5.0 4.8 - 5.6 % Final    Comment:    (NOTE) Pre diabetes:          5.7%-6.4%  Diabetes:              >6.4%  Glycemic control for   <7.0% adults with diabetes     CBG: Recent Labs  Lab 08/19/20 1925 08/19/20 2342 2020-08-25 0339 Aug 25, 2020 0448 08-25-20 0711  GLUCAP 101* 112* 84 94 82    Review of Systems:   Unable to assess due to critical illness, intubation, and unresponsivenss  Past Medical History:  He,  has a past medical history of Abnormal stress test (03/19/2018), Anemia in chronic kidney disease (07/04/2015), Anxiety, Aortic stenosis, Arthritis, CAD (coronary artery disease), Chronic anticoagulation (49/4496),  Complication of anesthesia, COVID-19 (03/15/2019), CPAP (continuous positive airway pressure) dependence, Dialysis patient ([place]) (04/07/2006), ESRD (end stage renal disease) ([place]), Heart murmur, HFrEF (heart failure with reduced ejection fraction) ([place]), History of blood transfusion, colonoscopy, Hyperlipidemia, Hypertension, Hypogonadism in male (07/25/2015), Ischemic cardiomyopathy, Myocardial infarction ([place]), OSA (obstructive sleep apnea), Paroxysmal atrial flutter ([place]), Peripheral vascular disease ([place]), Permanent atrial fibrillation ([place]), PONV (postoperative nausea and vomiting), and Renal cell carcinoma ([place]) (08/04/2016).   Surgical History:   Past Surgical History:  Procedure Laterality Date  . ABDOMINAL AORTOGRAM W/LOWER EXTREMITY N/A 12/22/2019   Procedure: ABDOMINAL AORTOGRAM W/LOWER EXTREMITY;  Surgeon: Marty Heck, MD;  Location: [place] [facility];  Service: Cardiovascular;  Laterality: N/A;  . AMPUTATION Right 04/14/2020  Procedure: RIGHT BELOW KNEE AMPUTATION;  Surgeon: Marty Heck, MD;  Location: [place];  Service: Vascular;  Laterality: Right;  . AORTIC VALVE REPLACEMENT  3 /11 /2014   At [place] Right 07/13/2020   Procedure: APPLICATION OF WOUND VAC;  Surgeon: Marty Heck, MD;  Location: [place];  Service: Vascular;  Laterality: Right;  . BACK SURGERY    . CAPD INSERTION N/A 02/19/2017   Procedure: LAPAROSCOPIC INSERTION CONTINUOUS AMBULATORY PERITONEAL DIALYSIS  (CAPD) CATHETER;  Surgeon: Algernon Huxley, MD;  Location: [facility];  Service: General;  Laterality: N/A;  . CARDIOVERSION N/A 08/15/2020   Procedure: CARDIOVERSION;  Surgeon: Kate Sable, MD;  Location: [facility];  Service: Cardiovascular;  Laterality: N/A;  . CORONARY ANGIOPLASTY WITH STENT PLACEMENT    . CORONARY ARTERY BYPASS GRAFT N/A 09/16/2018   Procedure: CORONARY ARTERY BYPASS GRAFT times three      with left internal mammary  artery and endoharvest of right leg greater saphenous vein;  Surgeon: Melrose Nakayama, MD;  Location: [place];  Service: Open Heart Surgery;  Laterality: N/A;  . CORONARY STENT INTERVENTION N/A 07/15/2019   Procedure: CORONARY STENT INTERVENTION;  Surgeon: Wellington Hampshire, MD;  Location: [place] [facility] [facility];  Service: Cardiovascular;  Laterality: N/A;  . DG AV DIALYSIS  SHUNT ACCESS EXIST*L* OR     left arm  . EPIGASTRIC HERNIA REPAIR N/A 02/19/2017   Procedure: HERNIA REPAIR EPIGASTRIC ADULT;  Surgeon: Robert Bellow, MD;  Location: [facility];  Service: General;  Laterality: N/A;  . EYE SURGERY     bilateral cataract  . HERNIA REPAIR     UMBILICAL HERNIA  . HERNIA REPAIR  02/19/2017  . INNER EAR SURGERY     20 years ago  . KIDNEY TRANSPLANT    . KNEE ARTHROSCOPY WITH MEDIAL MENISECTOMY Right 07/02/2020   Procedure: KNEE ARTHROSCOPY WITH INCISION AND DRAINAGE;  Surgeon: Leim Fabry, MD;  Location: [facility];  Service: Orthopedics;  Laterality: Right;  . LEFT HEART CATH AND CORS/GRAFTS ANGIOGRAPHY N/A 07/15/2019   Procedure: LEFT HEART CATH AND CORS/GRAFTS ANGIOGRAPHY;  Surgeon: Wellington Hampshire, MD;  Location: [place] [facility];  Service: Cardiovascular;  Laterality: N/A;  . LOWER EXTREMITY ANGIOGRAPHY Right 08/25/2019   Procedure: LOWER EXTREMITY ANGIOGRAPHY;  Surgeon: Algernon Huxley, MD;  Location: [facility];  Service: Cardiovascular;  Laterality: Right;  . LOWER EXTREMITY ANGIOGRAPHY Left 09/29/2019   Procedure: Lower Extremity Angiography;  Surgeon: Algernon Huxley, MD;  Location: [place] [facility];  Service: Cardiovascular;  Laterality: Left;  . PERIPHERAL VASCULAR INTERVENTION Right 12/22/2019   Procedure: PERIPHERAL VASCULAR INTERVENTION;  Surgeon: Marty Heck, MD;  Location: [facility];  Service: Cardiovascular;  Laterality: Right;  . Peritoneal Dialysis access placement  02/19/2017  . PSEUDOANERYSM COMPRESSION N/A 09/29/2019   Procedure: PSEUDOANERYSM  COMPRESSION;  Surgeon: Algernon Huxley, MD;  Location: [place] [facility];  Service: Cardiovascular;  Laterality: N/A;  . REMOVAL OF A DIALYSIS CATHETER N/A 11/23/2017   Procedure: REMOVAL OF A PERITONEAL DIALYSIS CATHETER;  Surgeon: Algernon Huxley, MD;  Location: [facility];  Service: Vascular;  Laterality: N/A;  . RIGHT/LEFT HEART CATH AND CORONARY ANGIOGRAPHY Bilateral 06/07/2018   Procedure: RIGHT/LEFT HEART CATH AND CORONARY ANGIOGRAPHY;  Surgeon: Wellington Hampshire, MD;  Location: [facility] [facility];  Service: Cardiovascular;  Laterality: Bilateral;  . TEE WITHOUT CARDIOVERSION N/A 07/21/2016   Procedure: TRANSESOPHAGEAL  ECHOCARDIOGRAM (TEE);  Surgeon: Wellington Hampshire, MD;  Location: [facility];  Service: Cardiovascular;  Laterality: N/A;  . TEE WITHOUT CARDIOVERSION N/A 09/16/2018   Procedure: TRANSESOPHAGEAL ECHOCARDIOGRAM (TEE);  Surgeon: Melrose Nakayama, MD;  Location: [place];  Service: Open Heart Surgery;  Laterality: N/A;  . WOUND DEBRIDEMENT Right 07/13/2020   Procedure: RIGHT BELOW KNEE AMPUTATION DEBRIDEMENT WITH [facility];  Surgeon: Marty Heck, MD;  [facility];  Service: Vascular;  Laterality: Right;     Social History:   reports that he has never smoked. He has never used smokeless tobacco. He reports previous alcohol use. He reports that he does not use drugs.   Family History:  His family history includes Cancer in his mother; Diabetes in his father; Heart disease in his father.   Allergies Allergies  Allergen Reactions  . Statins Other (See Comments)    Arthralgias (intolerance), Myalgias (intolerance)  Pt taking pravastatin   . Gabapentin     Drowsy   . Sertraline Rash     Home Medications  Prior to Admission medications   Medication Sig Start Date End Date Taking? Authorizing Provider  acetaminophen (TYLENOL) 650 MG CR tablet Take 1,300 mg by mouth every 8 (eight) hours as needed for pain.    [provider]  albuterol (VENTOLIN  HFA) 108 (90 Base) MCG/ACT inhaler Inhale 2 puffs into the lungs every 4 (four) hours as needed for wheezing or shortness of breath. 08/16/20   Loletha Grayer, MD  allopurinol (ZYLOPRIM) 100 MG tablet Take 1 tablet (100 mg total) by mouth daily. 05/03/20   Love, Ivan Anchors, PA-C  amiodarone (PACERONE) 200 MG tablet Two tablets po twice a day for one week then one tablet twice a day afterwards 08/16/20   Loletha Grayer, MD  Carbomer Gel Base (HYDROGEL) GEL Apply gel to graft site of right lower extremity 08/07/20   Setzer, Edman Circle, PA-C  cinacalcet (SENSIPAR) 30 MG tablet TAKE 1 TABLET BY MOUTH DAILY WITH FOOD (DO NOT TAKE LESS THAN 12 HOURS PRIOR TO DIALYSIS) Patient not taking: Reported on 08/12/2020 07/16/20   [provider]  clopidogrel (PLAVIX) 75 MG tablet Take 1 tablet (75 mg total) by mouth daily with breakfast. 07/29/19   Wellington Hampshire, MD  ezetimibe (ZETIA) 10 MG tablet TAKE 1 TABLET BY MOUTH EVERY DAY Patient taking differently: Take 10 mg by mouth daily. 03/19/20   Dunn, Areta Haber, PA-C  hydroxychloroquine (PLAQUENIL) 200 MG tablet Take 400 mg by mouth at bedtime. 09/01/19   [provider]  lidocaine-prilocaine (EMLA) cream Apply 1 application topically daily as needed (for fistula).    [provider]  loperamide (IMODIUM A-D) 2 MG tablet Take 2 mg by mouth 4 (four) times daily as needed for diarrhea or loose stools.    [provider]  loratadine (CLARITIN) 10 MG tablet Take 10 mg by mouth daily.    [provider]  megestrol (MEGACE) 40 MG/ML suspension Take 400 mg by mouth daily. 05/04/20   [provider]  metoprolol succinate (TOPROL-XL) 25 MG 24 hr tablet Take 1 tablet (25 mg total) by mouth daily. 08/12/2020   Loletha Grayer, MD  midodrine (PROAMATINE) 10 MG tablet Take 1 tablet (10 mg total) by mouth 3 (three) times daily with meals. 08/16/20   Loletha Grayer, MD  multivitamin (RENA-VIT) TABS tablet Take 1 tablet by mouth daily.  02/23/19   [provider]  nitroGLYCERIN (NITROSTAT) 0.4 MG SL tablet Place 1 tablet (  0.4 mg total) under the tongue every 5 (five) minutes as needed for chest pain. 06/24/19 03/13/20  Loel Dubonnet, NP  oxyCODONE-acetaminophen (PERCOCET/ROXICET) 5-325 MG tablet Take 1 tablet by mouth every 4 (four) hours as needed. 07/13/20   Ulyses Amor, PA-C  pantoprazole (PROTONIX) 40 MG tablet Take 1 tablet (40 mg total) by mouth daily. 07/29/19   Wellington Hampshire, MD  phenylephrine (,USE FOR PREPARATION-H,) 0.25 % suppository Place 1 suppository rectally 2 (two) times daily as needed for hemorrhoids.    [provider]  polyethylene glycol (MIRALAX / GLYCOLAX) 17 g packet Take 17 g by mouth daily as needed for mild constipation. 05/03/20   Love, Ivan Anchors, PA-C  rosuvastatin (CRESTOR) 40 MG tablet TAKE 1 TABLET BY MOUTH DAILY AT 6 PM 08/16/20   Wellington Hampshire, MD  sevelamer carbonate (RENVELA) 800 MG tablet Take 3,200 mg by mouth 3 (three) times daily with meals.    [provider]  traZODone (DESYREL) 50 MG tablet TAKE 1/2 -1 TABLET (25-50 MG TOTAL) BY MOUTH AT BEDTIME AS NEEDED FOR SLEEP. Patient taking differently: Take 50 mg by mouth at bedtime as needed. 11/10/19   Leone Haven, MD  warfarin (COUMADIN) 1 MG tablet Take 1 tablet (1 mg total) by mouth every other day. 08/16/20   Loletha Grayer, MD     Critical care time: 40 minutes    Darel Hong, AGACNP-BC [name] Prefer epic messenger for cross cover needs If after hours, please call E-link

## 2020-09-05 NOTE — Progress Notes (Addendum)
1318 Terminally extubated per family request. Patient made comfort care and started on Morphine drip before extubation. Family present for passing of patient.

## 2020-09-05 NOTE — Progress Notes (Signed)
[facility]  ROUNDING NOTE   Subjective:   [name] was admitted to [facility] on 08/23/2020 for Cardiac arrest [facility]) [I46.9]  Discharged to home on 5/12 and family found him down with PEA. Family started CPR and transferred to the ED.   Patient underwent cardioversion on 5/11. Previous admission for Acute on Ch Sys CHF, SVT chronic A Flutter,  Non healing Rt BKA  Patient remains intubated , vent assisted Sedated with propofol, fentanyl Requiring Norepi, vasopressin Vent assisted Has myoclonus of chin MRI scheduled for this AM  Objective:  Vital signs in last 24 hours:  Temp:  [97.9 F (36.6 C)-99 F (37.2 C)] 98.4 F (36.9 C) (05/16 0600) Pulse Rate:  [66-88] 88 (05/16 0800) Resp:  [12-26] 26 (05/16 0800) BP: (94-139)/(50-78) 110/50 (05/16 0800) SpO2:  [91 %-100 %] 91 % (05/16 0800) Arterial Line BP: (93-128)/(33-44) 103/33 (05/16 0800) FiO2 (%):  [30 %-100 %] 100 % (05/16 0737)  Weight change:  Filed Weights   08/29/2020 1548 08/31/2020 1822  Weight: 71 kg 71.1 kg    Intake/Output: I/O last 3 completed shifts: In: 3607.8 [I.V.:3369.3; NG/GT:50; IV Piggyback:188.6] Out: 5 [Urine:5]   Intake/Output this shift:  No intake/output data recorded.  Physical Exam: General: Critically ill  Head: ETT   Eyes: closed  Lungs:  Diminished bilaterally, vent assisted  Heart: Irregular, +murmur  Abdomen:  Soft   Extremities:  no edema, Right BKA with clean dressings.   Neurologic: Intubated and sedated  Skin: No lesions  Access: Left AVF    Basic Metabolic Panel: Recent Labs  Lab 08/09/2020 1520 08/27/2020 1838 08/18/20 0336 08/19/20 0338 22-Aug-2020 0452  NA 139 140 141  141 135 132*  K 3.2* 3.2* 3.5  3.5 2.9* 4.1  CL 94* 96* 98  98 95* 91*  CO2 '27 31 31  30 27 23  ' GLUCOSE 136* 130* 118*  115* 322* 104*  BUN '16 18 21  22 ' 28* 35*  CREATININE 3.74* 3.78* 4.33*  4.42* 5.31* 6.14*  CALCIUM 8.1* 8.0* 8.4*  8.3* 8.1* 7.8*  MG 2.1  --  1.8 2.1 1.9  PHOS   --   --  5.7* 5.9* 7.4*    Liver Function Tests: Recent Labs  Lab 08/15/20 0454 08/16/20 0425 08/19/2020 1520 08/18/20 0336  AST 235* 138* 97* 82*  ALT 291* 217* 153* 133*  ALKPHOS 135* 133* 142* 126  BILITOT 1.4* 1.5* 1.5* 1.8*  PROT 7.0 6.9 7.2 6.6  ALBUMIN 2.9* 2.8* 2.8* 2.6*   No results for input(s): LIPASE, AMYLASE in the last 168 hours. No results for input(s): AMMONIA in the last 168 hours.  CBC: Recent Labs  Lab 08/16/20 0425 08/30/2020 1520 08/18/20 0336 08/19/20 0338 08-22-20 0452  WBC 5.6 12.2* 8.5 6.8 3.1*  NEUTROABS  --  4.6  --   --   --   HGB 10.9* 10.8* 10.6* 11.0* 11.4*  HCT 35.2* 37.4* 36.0* 37.6* 37.0*  MCV 101.7* 106.3* 104.7* 105.0* 101.1*  PLT 79* 92* 96* 82* 90*    Cardiac Enzymes: No results for input(s): CKTOTAL, CKMB, CKMBINDEX, TROPONINI in the last 168 hours.  BNP: Invalid input(s): POCBNP  CBG: Recent Labs  Lab 08/19/20 1925 08/19/20 2342 08-22-2020 0339 08-22-20 0448 08-22-20 0711  GLUCAP 101* 112* 84 94 82    Microbiology: Results for orders placed or performed during the hospital encounter of 08/14/2020  Urine Culture     Status: None   Collection Time: 08/16/2020  2:11 PM  Specimen: Urine, Random  Result Value Ref Range Status   Specimen Description   Final    URINE, RANDOM Performed at [facility], [address] [place], [place] [address]    Special Requests   Final    NONE Performed at [facility], [address], [place], [place] [address]    Culture   Final    NO GROWTH Performed at [facility], [place] [address], [place], [place] [address]    Report Status 09/04/20 FINAL  Final  Resp Panel by RT-PCR (Flu A&B, Covid) Nasopharyngeal Swab     Status: None   Collection Time: 08/19/2020  3:34 PM   Specimen: Nasopharyngeal Swab; Nasopharyngeal(NP) swabs in vial transport medium  Result Value Ref Range Status   SARS Coronavirus 2 by RT PCR NEGATIVE NEGATIVE Final    Comment:  (NOTE) SARS-CoV-2 target nucleic acids are NOT DETECTED.  The SARS-CoV-2 RNA is generally detectable in upper respiratory specimens during the acute phase of infection. The lowest concentration of SARS-CoV-2 viral copies this assay can detect is 138 copies/mL. A negative result does not preclude SARS-Cov-2 infection and should not be used as the sole basis for treatment or other patient management decisions. A negative result may occur with  improper specimen collection/handling, submission of specimen other than nasopharyngeal swab, presence of viral mutation(s) within the areas targeted by this assay, and inadequate number of viral copies(<138 copies/mL). A negative result must be combined with clinical observations, patient history, and epidemiological information. The expected result is Negative.  Fact Sheet for Patients:  EntrepreneurPulse.com.au  Fact Sheet for Healthcare Providers:  IncredibleEmployment.be  This test is no t yet approved or cleared by the [place] FDA and  has been authorized for detection and/or diagnosis of SARS-CoV-2 by FDA under an Emergency Use Authorization (EUA). This EUA will remain  in effect (meaning this test can be used) for the duration of the COVID-19 declaration under Section 564(b)(1) of the Act, 21 U.S.C.section 360bbb-3(b)(1), unless the authorization is terminated  or revoked sooner.       Influenza A by PCR NEGATIVE NEGATIVE Final   Influenza B by PCR NEGATIVE NEGATIVE Final    Comment: (NOTE) The Xpert Xpress SARS-CoV-2/FLU/RSV plus assay is intended as an aid in the diagnosis of influenza from Nasopharyngeal swab specimens and should not be used as a sole basis for treatment. Nasal washings and aspirates are unacceptable for Xpert Xpress SARS-CoV-2/FLU/RSV testing.  Fact Sheet for Patients: EntrepreneurPulse.com.au  Fact Sheet for Healthcare  Providers: IncredibleEmployment.be  This test is not yet approved or cleared by the [place] FDA and has been authorized for detection and/or diagnosis of SARS-CoV-2 by FDA under an Emergency Use Authorization (EUA). This EUA will remain in effect (meaning this test can be used) for the duration of the COVID-19 declaration under Section 564(b)(1) of the Act, 21 U.S.C. section 360bbb-3(b)(1), unless the authorization is terminated or revoked.  Performed at [facility], [address] [place], [place] [address]   MRSA PCR Screening     Status: None   Collection Time: 08/22/2020  6:38 PM   Specimen: Nasopharyngeal  Result Value Ref Range Status   MRSA by PCR NEGATIVE NEGATIVE Final    Comment:        The GeneXpert MRSA Assay (FDA approved for NASAL specimens only), is one component of a comprehensive MRSA colonization surveillance program. It is not intended to diagnose MRSA infection nor to guide or monitor treatment for MRSA  infections. Performed at [facility], [address] [place], [place] [address]   CULTURE, BLOOD (ROUTINE X 2) w Reflex to ID Panel     Status: None (Preliminary result)   Collection Time: 08/18/2020  7:07 PM   Specimen: BLOOD  Result Value Ref Range Status   Specimen Description BLOOD LEFT ANTECUBITAL  Final   Special Requests   Final    BOTTLES DRAWN AEROBIC AND ANAEROBIC Blood Culture adequate volume   Culture   Final    NO GROWTH 3 DAYS Performed at [facility], [address] [place], [place] [address]    Report Status PENDING  Incomplete  CULTURE, BLOOD (ROUTINE X 2) w Reflex to ID Panel     Status: None (Preliminary result)   Collection Time: 09/01/2020  7:08 PM   Specimen: BLOOD  Result Value Ref Range Status   Specimen Description BLOOD BLOOD LEFT HAND  Final   Special Requests   Final    BOTTLES DRAWN AEROBIC AND ANAEROBIC Blood Culture adequate volume   Culture   Final    NO GROWTH 3  DAYS Performed at [facility], [address] [place], [place] [address]    Report Status PENDING  Incomplete   *Note: Due to a large number of results and/or encounters for the requested time period, some results have not been displayed. A complete set of results can be found in Results Review.    Coagulation Studies: Recent Labs    09/02/2020 1520 08/18/20 0336 08/19/20 0338  LABPROT 36.2* 33.5* 27.8*  INR 3.6* 3.3* 2.6*    Urinalysis: Recent Labs    08/18/2020 1621  COLORURINE AMBER*  LABSPEC 1.030  PHURINE 9.0*  GLUCOSEU 150*  HGBUR SMALL*  BILIRUBINUR NEGATIVE  KETONESUR NEGATIVE  PROTEINUR >=300*  NITRITE NEGATIVE  LEUKOCYTESUR SMALL*      Imaging: DG Abd 1 View  Result Date: 08/19/2020 CLINICAL DATA:  Status post OG tube placement EXAM: ABDOMEN - 1 VIEW COMPARISON:  08/18/2020 FINDINGS: The enteric tube tip projects over the body of stomach. Side port is below the level of the GE junction. No dilated bowel loops identified. Calcified renal graft and endovascular coils are noted within the right lower quadrant of the abdomen. Aortic atherosclerosis. IMPRESSION: Satisfactory position of enteric tube with tip in the body of stomach. Electronically Signed   By: Kerby Moors M.D.   On: 08/19/2020 08:05   MR BRAIN WO CONTRAST  Result Date: 08/19/2020 CLINICAL DATA:  [place] and brain injury EXAM: MRI HEAD WITHOUT CONTRAST TECHNIQUE: Multiplanar, multiecho pulse sequences of the brain and surrounding structures were obtained without intravenous contrast. COMPARISON:  Head CT from 2 days ago FINDINGS: Brain: Accentuated cortical diffusion signal in the bilateral occipital and perisylvian regions and in the bilateral deep gray nuclei. Findings compatible with history of anoxic brain injury. The findings are more symmetric and discontinuous than seen with global seizure. Small remote left cerebellar infarct. No hemorrhage, hydrocephalus, or collection. T1  hyperintensity in the globus pallidus, likely metabolic. Few remote microhemorrhages in the deep brain, usually hypertensive the white matter disease is mild and there is also a history of mechanical valve. Small area of right parietal superficial siderosis. Vascular: Preserved flow voids Skull and upper cervical spine: Mildly hypointense marrow likely related and end-stage renal disease. Sinuses/Orbits: Partial right mastoid opacification. Bilateral cataract resection. IMPRESSION: 1. Cortical and basal ganglia restricted diffusion that correlates with history of global anoxic injury. 2. No signs of elevated intracranial pressure.  Electronically Signed   By: Monte Fantasia M.D.   On: 08/19/2020 11:42   DG Chest Port 1 View  Result Date: 08/19/2020 CLINICAL DATA:  Status post intubation.  Evaluate OG tube placement. EXAM: PORTABLE CHEST 1 VIEW COMPARISON:  08/18/2020 FINDINGS: ET tube tip is in satisfactory position above the carina. The nasogastric tube and side port are below the level of the GE junction. Previous median sternotomy and CABG procedure. Stable cardiac enlargement. Mild diffuse edema. New opacity is identified within the right lung base which may represent atelectasis or pneumonia. IMPRESSION: Satisfactory position of ET tube and nasogastric tube. New opacity within the medial right lung base which may represent atelectasis or pneumonia. Electronically Signed   By: Kerby Moors M.D.   On: 08/19/2020 08:04     Medications:   . sodium chloride Stopped (08/18/20 2117)  . dextrose 100 mL/hr at 2020/09/05 0600  . fentaNYL infusion INTRAVENOUS Stopped (08/19/20 1330)  . levETIRAcetam 1,000 mg (2020/09/05 0816)  . levETIRAcetam Stopped (08/19/20 0839)  . norepinephrine (LEVOPHED) Adult infusion 25 mcg/min (09/05/2020 0600)  . vasopressin 0.04 Units/min (2020/09/05 0739)   . chlorhexidine gluconate (MEDLINE KIT)  15 mL Mouth Rinse BID  . Chlorhexidine Gluconate Cloth  6 each Topical Daily  .  docusate  100 mg Per Tube BID  . insulin aspart  0-6 Units Subcutaneous Q4H  . mouth rinse  15 mL Mouth Rinse 10 times per day  . pantoprazole (PROTONIX) IV  40 mg Intravenous QHS  . valproic acid  250 mg Per Tube QID   dextrose, fentaNYL, levETIRAcetam, LORazepam  Assessment/ Plan:  Mr. MIKAL BLASDELL is a 64 y.o. 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 End Stage Renal Disease completed hemodialysis Friday AM.  - Monitor daily for dialysis need.  - Events noted over the weekend. - patient dx with global anoxic brain injury - family contemplating comfort care - hold further HD   2. Hypotension in setting of recent cardiogenic shock, PEA arrest and atrial fibrillation with rapid ventricular rhythm.  Status post DC cardioversion on 5/11 by Dr. Garen Lah.  - norepinephrine  3. Anemia of chronic kidney disease: Lab Results  Component Value Date   HGB 11.4 (L) 09/05/2020     -Retacrit as outpatient.   4. Acute respiratory failure:  requiring intubation and mechanical ventilation.  Anoxic brain injury   LOS: 3 Mackenzi Krogh 06/01/20228:57 AM

## 2020-09-05 NOTE — Discharge Summary (Signed)
DEATH SUMMARY   Patient Details  Name: Jeremy [name] MRN: [id number] DOB: [birth year]  Admission/Discharge Information   Admit Date:  09/10/20  Date of Death:  2020-09-13  Time of Death:  13:30  Length of Stay: 3  Referring Physician: Leone Haven, MD   Reason(s) for Hospitalization  Cardiac Arrest Cardiogenic Shock Severe Anoxic Encephalopathy Myoclonus ESRD on Hemodialysis Acute Hypoxic Respiratory Failure HFrEF  Diagnoses  Preliminary cause of death:   Cardiac Arrest Secondary Diagnoses (including complications and co-morbidities):  Active Problems:   ESRD (end stage renal disease) (HCC)   CAD (coronary artery disease)   Hyperlipidemia   Essential hypertension   Anemia of chronic disease   Acute respiratory failure (HCC)   HFrEF (heart failure with reduced ejection fraction) ([place])   Cardiac arrest (HCC)   Severe hypoxic-ischemic encephalopathy   Anoxic brain damage (HCC)   Cardiogenic shock [facility])   Brief Hospital Course (including significant findings, care, treatment, and services provided and events leading to death)  65 y.o. Male with extensive past medical history as listed below who is admitted to ICU following out of hospital PEA cardiac arrest (etiology of cardiac arrest not entirely clear) and Cardiogenic shock. Completed Targeted Temperature Management @ 36 C. Following completion of TTM, there is now concern for severe Anoxic Brain Injury and Myoclonus. Pt remains unresponsive and Neuro exam without cough/gag/corneal reflexes.  MRI Brain on 08/19/2020 with restricted diffusion of the cortical and basal ganglia, concerning for global anoxic brain injury.  On 2020/09/13, given his poor neurological status and very poor chance of Neurological recovery, family elected to transition to comfort care measures.  Pt expired very shortly after care withdrawn.  Of note he was recently admitted from 08/13/20 through 08/16/20 for syncope, wide complex tachycardia, and  hyperkalemia.  He required Amiodarone for rate control, emergent Hemodialysis, and Cardioversion on 08/15/20.  Significant Hospital Events: Including procedures, antibiotic start and stop dates in addition to other pertinent events    5/13Patient reportedly was dialyzed earlier today, and then family found him down and pulseless this afternoonwhile in the bathroom,Family started CPR, then fire, then EMS reports an additional 10 minutes of CPR. Sounds like 20 minutes of total downtime. Initial rhythm was PEA. No shocks provided. 2 rounds of epi and King airway was placed in the field, replaced with ETT  5/13ICU team called for admission, plan for Hypothermia protocol if CT head is NEG  5/14 multiorgan failure, signs of brain damage, on hypothermia protocol  5/15hypothermia protocol completed, +myoclonus  September 14, 2022: MRI yesterday concerning for global anoxic brain injury. Palliative Care consulted.  Critically ill, poor prognosis. Transitioned to Comfort Measures.      Pertinent Labs and Studies  Significant Diagnostic Studies DG Abd 1 View  Result Date: 08/19/2020 CLINICAL DATA:  Status post OG tube placement EXAM: ABDOMEN - 1 VIEW COMPARISON:  08/18/2020 FINDINGS: The enteric tube tip projects over the body of stomach. Side port is below the level of the GE junction. No dilated bowel loops identified. Calcified renal graft and endovascular coils are noted within the right lower quadrant of the abdomen. Aortic atherosclerosis. IMPRESSION: Satisfactory position of enteric tube with tip in the body of stomach. Electronically Signed   By: Kerby Moors M.D.   On: 08/19/2020 08:05   CT HEAD WO CONTRAST  Result Date: 09-10-2020 CLINICAL DATA:  Mental status changes, found unresponsive. EXAM: CT HEAD WITHOUT CONTRAST TECHNIQUE: Contiguous axial images were obtained from the base of the skull through the vertex  without intravenous contrast. COMPARISON:  None. FINDINGS: Brain: No acute  intracranial abnormality. Specifically, no hemorrhage, hydrocephalus, mass lesion, acute infarction, or significant intracranial injury. Vascular: No hyperdense vessel or unexpected calcification. Skull: No acute calvarial abnormality. Sinuses/Orbits: Mucosal thickening in the ethmoid air cells. No air-fluid levels. Other: None IMPRESSION: No acute intracranial abnormality. Electronically Signed   By: Rolm Baptise M.D.   On: 08/19/2020 17:10   MR BRAIN WO CONTRAST  Result Date: 08/19/2020 CLINICAL DATA:  [place] and brain injury EXAM: MRI HEAD WITHOUT CONTRAST TECHNIQUE: Multiplanar, multiecho pulse sequences of the brain and surrounding structures were obtained without intravenous contrast. COMPARISON:  Head CT from 2 days ago FINDINGS: Brain: Accentuated cortical diffusion signal in the bilateral occipital and perisylvian regions and in the bilateral deep gray nuclei. Findings compatible with history of anoxic brain injury. The findings are more symmetric and discontinuous than seen with global seizure. Small remote left cerebellar infarct. No hemorrhage, hydrocephalus, or collection. T1 hyperintensity in the globus pallidus, likely metabolic. Few remote microhemorrhages in the deep brain, usually hypertensive the white matter disease is mild and there is also a history of mechanical valve. Small area of right parietal superficial siderosis. Vascular: Preserved flow voids Skull and upper cervical spine: Mildly hypointense marrow likely related and end-stage renal disease. Sinuses/Orbits: Partial right mastoid opacification. Bilateral cataract resection. IMPRESSION: 1. Cortical and basal ganglia restricted diffusion that correlates with history of global anoxic injury. 2. No signs of elevated intracranial pressure. Electronically Signed   By: Monte Fantasia M.D.   On: 08/19/2020 11:42   DG Chest Port 1 View  Result Date: 08/19/2020 CLINICAL DATA:  Status post intubation.  Evaluate OG tube placement. EXAM:  PORTABLE CHEST 1 VIEW COMPARISON:  08/18/2020 FINDINGS: ET tube tip is in satisfactory position above the carina. The nasogastric tube and side port are below the level of the GE junction. Previous median sternotomy and CABG procedure. Stable cardiac enlargement. Mild diffuse edema. New opacity is identified within the right lung base which may represent atelectasis or pneumonia. IMPRESSION: Satisfactory position of ET tube and nasogastric tube. New opacity within the medial right lung base which may represent atelectasis or pneumonia. Electronically Signed   By: Kerby Moors M.D.   On: 08/19/2020 08:04   DG Chest Port 1 View  Result Date: 08/18/2020 CLINICAL DATA:  Endotracheal tube placement EXAM: PORTABLE CHEST 1 VIEW COMPARISON:  08/21/2020 FINDINGS: ET tube tip is above the carina. There is an NG tube with side port below GE junction. Previous median sternotomy and CABG procedure. Stable cardiac enlargement. Mild diffuse interstitial edema. IMPRESSION: 1. Mild interstitial edema 2. Stable position of ET tube with tip above carina. Electronically Signed   By: Kerby Moors M.D.   On: 08/18/2020 08:07   DG Chest Portable 1 View  Result Date: 08/28/2020 CLINICAL DATA:  Status post intubation EXAM: PORTABLE CHEST 1 VIEW COMPARISON:  Chest x-ray from earlier same day. FINDINGS: Endotracheal tube is now positioned just below the level of the clavicles, approximately 5 cm above the carina. Enteric tube passes below the diaphragm. Stable cardiomegaly. Stable central pulmonary vascular congestion and interstitial prominence. No pleural effusion or pneumothorax is seen. IMPRESSION: 1. Endotracheal tube now positioned with tip just below the level of the clavicles, approximately 5 cm above the carina. 2. Central pulmonary vascular congestion and probable bilateral interstitial edema. Electronically Signed   By: Franki Cabot M.D.   On: 08/24/2020 15:54   DG Chest Portable 1 View  Result  Date:  09/01/2020 CLINICAL DATA:  Hypoxia EXAM: PORTABLE CHEST 1 VIEW COMPARISON:  Aug 12, 2020 FINDINGS: Endotracheal tube tip is 3.1 cm above the carina. Nasogastric tube side port seen in stomach with tip below diaphragm, not seen. No pneumothorax. There is cardiomegaly with pulmonary venous hypertension. There is interstitial prominence, likely due to a degree of interstitial pulmonary edema. No airspace opacity. No pleural effusions evident. Patient is status post coronary artery bypass grafting. There is aortic atherosclerosis. No evident adenopathy. Old healed fracture right posterior sixth rib. IMPRESSION: Tube positions as described without pneumothorax. Cardiomegaly with pulmonary vascular congestion. Suspect mild interstitial edema. No consolidation. Status post coronary artery bypass grafting. Aortic Atherosclerosis (ICD10-I70.0). Electronically Signed   By: Lowella Grip III M.D.   On: 08/12/2020 15:44   DG Chest Portable 1 View  Result Date: 08/12/2020 CLINICAL DATA:  65 year old male with shortness of breath EXAM: PORTABLE CHEST 1 VIEW COMPARISON:  Prior chest x-ray 07/18/2020 FINDINGS: Stable cardiomegaly. Increased vascular congestion with mild interstitial edema. Patient is status post median sternotomy with evidence of prior multivessel CABG and aortic valve replacement. Atherosclerotic calcifications present in the transverse aorta. No large effusion or pneumothorax. External defibrillator pad projects over the central chest. No acute osseous abnormality. IMPRESSION: 1. Mild CHF versus volume overload. 2. Cardiomegaly. 3. Aortic atherosclerotic calcifications. Electronically Signed   By: Jacqulynn Cadet M.D.   On: 08/12/2020 10:07   ECHOCARDIOGRAM COMPLETE  Result Date: 08/14/2020    ECHOCARDIOGRAM REPORT   Patient Name:   JHAIR WITHERINGTON Date of Exam: 08/14/2020 Medical Rec #:  [id number]   Height:       70.0 in Accession #:    [id number]  Weight:       156.7 lb Date of Birth:  1956/01/24   BSA:           1.882 m Patient Age:    66 years    BP:           89/67 mmHg Patient Gender: M           HR:           114 bpm. Exam Location:  [facility] Procedure: 2D Echo, Cardiac Doppler and Color Doppler Indications:     Ventriculat Tachycardia I47.2  History:         Patient has prior history of Echocardiogram examinations, most                  recent 10/01/2019. Previous Myocardial Infarction. ESRD, PAF.  Sonographer:     Sherrie Sport RDCS (AE) Referring Phys:  [place] Phys: Kate Sable MD IMPRESSIONS  1. Left ventricular ejection fraction, by estimation, is 25 to 30%. The left ventricle has severely decreased function. The left ventricle demonstrates global hypokinesis. There is mild left ventricular hypertrophy. Left ventricular diastolic parameters  are indeterminate.  2. Right ventricular systolic function is severely reduced. The right ventricular size is moderately enlarged.  3. Left atrial size was severely dilated.  4. The mitral valve is degenerative. Mild mitral valve regurgitation. Moderate mitral annular calcification.  5. The aortic valve has been repaired/replaced. Aortic valve regurgitation is not visualized.  6. The inferior vena cava is normal in size with <50% respiratory variability, suggesting right atrial pressure of 8 mmHg. FINDINGS  Left Ventricle: Left ventricular ejection fraction, by estimation, is 25 to 30%. The left ventricle has severely decreased function. The left ventricle demonstrates global hypokinesis. The left ventricular internal cavity size was normal  in size. There is mild left ventricular hypertrophy. Left ventricular diastolic parameters are indeterminate. Right Ventricle: The right ventricular size is moderately enlarged. No increase in right ventricular wall thickness. Right ventricular systolic function is severely reduced. Left Atrium: Left atrial size was severely dilated. Right Atrium: Right atrial size was normal in size. Pericardium: There is no  evidence of pericardial effusion. Mitral Valve: The mitral valve is degenerative in appearance. Moderate mitral annular calcification. Mild mitral valve regurgitation. Tricuspid Valve: The tricuspid valve is normal in structure. Tricuspid valve regurgitation is mild. Aortic Valve: The aortic valve has been repaired/replaced. Aortic valve regurgitation is not visualized. Aortic valve mean gradient measures 5.0 mmHg. Aortic valve peak gradient measures 8.4 mmHg. Aortic valve area, by VTI measures 1.97 cm. There is a mechanical valve present in the aortic position. Pulmonic Valve: The pulmonic valve was grossly normal. Pulmonic valve regurgitation is not visualized. Aorta: The aortic root is normal in size and structure. Venous: The inferior vena cava is normal in size with less than 50% respiratory variability, suggesting right atrial pressure of 8 mmHg. IAS/Shunts: No atrial level shunt detected by color flow Doppler.  LEFT VENTRICLE PLAX 2D LVIDd:         5.57 cm LVIDs:         4.52 cm LV PW:         1.35 cm LV IVS:        1.01 cm LVOT diam:     2.00 cm LV SV:         36 LV SV Index:   19 LVOT Area:     3.14 cm  RIGHT VENTRICLE RV Basal diam:  6.33 cm RV S prime:     4.46 cm/s TAPSE (M-mode): 3.4 cm LEFT ATRIUM              Index       RIGHT ATRIUM           Index LA diam:        5.30 cm  2.82 cm/m  RA Area:     35.50 cm LA Vol (A2C):   178.0 ml 94.58 ml/m RA Volume:   153.00 ml 81.30 ml/m LA Vol (A4C):   163.0 ml 86.61 ml/m LA Biplane Vol: 171.0 ml 90.86 ml/m  AORTIC VALVE                    PULMONIC VALVE AV Area (Vmax):    1.47 cm     PV Vmax:        0.45 m/s AV Area (Vmean):   1.48 cm     PV Peak grad:   0.8 mmHg AV Area (VTI):     1.97 cm     RVOT Peak grad: 2 mmHg AV Vmax:           144.67 cm/s AV Vmean:          105.667 cm/s AV VTI:            0.185 m AV Peak Grad:      8.4 mmHg AV Mean Grad:      5.0 mmHg LVOT Vmax:         67.90 cm/s LVOT Vmean:        49.800 cm/s LVOT VTI:          0.116 m  LVOT/AV VTI ratio: 0.63  AORTA Ao Root diam: 3.20 cm MITRAL VALVE  TRICUSPID VALVE MV Area (PHT): 3.87 cm     TR Peak grad:   31.8 mmHg MV Decel Time: 196 msec     TR Vmax:        282.00 cm/s MV E velocity: 129.00 cm/s                             SHUNTS                             Systemic VTI:  0.12 m                             Systemic Diam: 2.00 cm Kate Sable MD Electronically signed by Kate Sable MD Signature Date/Time: 08/14/2020/12:10:33 PM    Final     Microbiology Recent Results (from the past 240 hour(s))  Resp Panel by RT-PCR (Flu A&B, Covid) Nasopharyngeal Swab     Status: None   Collection Time: 08/12/20  9:46 AM   Specimen: Nasopharyngeal Swab; Nasopharyngeal(NP) swabs in vial transport medium  Result Value Ref Range Status   SARS Coronavirus 2 by RT PCR NEGATIVE NEGATIVE Final    Comment: (NOTE) SARS-CoV-2 target nucleic acids are NOT DETECTED.  The SARS-CoV-2 RNA is generally detectable in upper respiratory specimens during the acute phase of infection. The lowest concentration of SARS-CoV-2 viral copies this assay can detect is 138 copies/mL. A negative result does not preclude SARS-Cov-2 infection and should not be used as the sole basis for treatment or other patient management decisions. A negative result may occur with  improper specimen collection/handling, submission of specimen other than nasopharyngeal swab, presence of viral mutation(s) within the areas targeted by this assay, and inadequate number of viral copies(<138 copies/mL). A negative result must be combined with clinical observations, patient history, and epidemiological information. The expected result is Negative.  Fact Sheet for Patients:  EntrepreneurPulse.com.au  Fact Sheet for Healthcare Providers:  IncredibleEmployment.be  This test is no t yet approved or cleared by the [place] FDA and  has been authorized for detection  and/or diagnosis of SARS-CoV-2 by FDA under an Emergency Use Authorization (EUA). This EUA will remain  in effect (meaning this test can be used) for the duration of the COVID-19 declaration under Section 564(b)(1) of the Act, 21 U.S.C.section 360bbb-3(b)(1), unless the authorization is terminated  or revoked sooner.       Influenza A by PCR NEGATIVE NEGATIVE Final   Influenza B by PCR NEGATIVE NEGATIVE Final    Comment: (NOTE) The Xpert Xpress SARS-CoV-2/FLU/RSV plus assay is intended as an aid in the diagnosis of influenza from Nasopharyngeal swab specimens and should not be used as a sole basis for treatment. Nasal washings and aspirates are unacceptable for Xpert Xpress SARS-CoV-2/FLU/RSV testing.  Fact Sheet for Patients: EntrepreneurPulse.com.au  Fact Sheet for Healthcare Providers: IncredibleEmployment.be  This test is not yet approved or cleared by the [place] FDA and has been authorized for detection and/or diagnosis of SARS-CoV-2 by FDA under an Emergency Use Authorization (EUA). This EUA will remain in effect (meaning this test can be used) for the duration of the COVID-19 declaration under Section 564(b)(1) of the Act, 21 U.S.C. section 360bbb-3(b)(1), unless the authorization is terminated or revoked.  Performed at [facility], [address] [place], [place] [address]   Culture, blood (Routine X 2) w Reflex to  ID Panel     Status: None   Collection Time: 08/12/20  9:49 PM   Specimen: BLOOD  Result Value Ref Range Status   Specimen Description BLOOD BLOOD RIGHT FOREARM  Final   Special Requests   Final    BOTTLES DRAWN AEROBIC AND ANAEROBIC Blood Culture adequate volume   Culture   Final    NO GROWTH 5 DAYS Performed at [facility], [address] [place], [place] [address]    Report Status 08/31/2020 FINAL  Final  Culture, blood (Routine X 2) w Reflex to ID Panel     Status: None    Collection Time: 08/12/20  9:50 PM   Specimen: BLOOD  Result Value Ref Range Status   Specimen Description BLOOD  RIGHT HAND  Final   Special Requests   Final    BOTTLES DRAWN AEROBIC AND ANAEROBIC Blood Culture adequate volume   Culture   Final    NO GROWTH 5 DAYS Performed at [facility], [address], [place], [place] [address]    Report Status 08/16/2020 FINAL  Final  Urine Culture     Status: None   Collection Time: 08/06/2020  2:11 PM   Specimen: Urine, Random  Result Value Ref Range Status   Specimen Description   Final    URINE, RANDOM Performed at [facility], [address] [place], [place] [address]    Special Requests   Final    NONE Performed at [facility], [address] [place], [place] [address]    Culture   Final    NO GROWTH Performed at [facility], [place] [address], [place], [place] [address]    Report Status 09-11-20 FINAL  Final  Resp Panel by RT-PCR (Flu A&B, Covid) Nasopharyngeal Swab     Status: None   Collection Time: 08/06/2020  3:34 PM   Specimen: Nasopharyngeal Swab; Nasopharyngeal(NP) swabs in vial transport medium  Result Value Ref Range Status   SARS Coronavirus 2 by RT PCR NEGATIVE NEGATIVE Final    Comment: (NOTE) SARS-CoV-2 target nucleic acids are NOT DETECTED.  The SARS-CoV-2 RNA is generally detectable in upper respiratory specimens during the acute phase of infection. The lowest concentration of SARS-CoV-2 viral copies this assay can detect is 138 copies/mL. A negative result does not preclude SARS-Cov-2 infection and should not be used as the sole basis for treatment or other patient management decisions. A negative result may occur with  improper specimen collection/handling, submission of specimen other than nasopharyngeal swab, presence of viral mutation(s) within the areas targeted by this assay, and inadequate number of viral copies(<138 copies/mL). A negative result must be combined  with clinical observations, patient history, and epidemiological information. The expected result is Negative.  Fact Sheet for Patients:  EntrepreneurPulse.com.au  Fact Sheet for Healthcare Providers:  IncredibleEmployment.be  This test is no t yet approved or cleared by the [place] FDA and  has been authorized for detection and/or diagnosis of SARS-CoV-2 by FDA under an Emergency Use Authorization (EUA). This EUA will remain  in effect (meaning this test can be used) for the duration of the COVID-19 declaration under Section 564(b)(1) of the Act, 21 U.S.C.section 360bbb-3(b)(1), unless the authorization is terminated  or revoked sooner.       Influenza A by PCR NEGATIVE NEGATIVE Final   Influenza B by PCR NEGATIVE NEGATIVE Final    Comment: (NOTE) The Xpert Xpress SARS-CoV-2/FLU/RSV plus assay is intended as an aid in the diagnosis  of influenza from Nasopharyngeal swab specimens and should not be used as a sole basis for treatment. Nasal washings and aspirates are unacceptable for Xpert Xpress SARS-CoV-2/FLU/RSV testing.  Fact Sheet for Patients: EntrepreneurPulse.com.au  Fact Sheet for Healthcare Providers: IncredibleEmployment.be  This test is not yet approved or cleared by the [place] FDA and has been authorized for detection and/or diagnosis of SARS-CoV-2 by FDA under an Emergency Use Authorization (EUA). This EUA will remain in effect (meaning this test can be used) for the duration of the COVID-19 declaration under Section 564(b)(1) of the Act, 21 U.S.C. section 360bbb-3(b)(1), unless the authorization is terminated or revoked.  Performed at [facility], [address] [place], [place] [address]   MRSA PCR Screening     Status: None   Collection Time: 08/21/2020  6:38 PM   Specimen: Nasopharyngeal  Result Value Ref Range Status   MRSA by PCR NEGATIVE NEGATIVE Final     Comment:        The GeneXpert MRSA Assay (FDA approved for NASAL specimens only), is one component of a comprehensive MRSA colonization surveillance program. It is not intended to diagnose MRSA infection nor to guide or monitor treatment for MRSA infections. Performed at [facility], [address] [place], [place] [address]   CULTURE, BLOOD (ROUTINE X 2) w Reflex to ID Panel     Status: None (Preliminary result)   Collection Time: 08/16/2020  7:07 PM   Specimen: BLOOD  Result Value Ref Range Status   Specimen Description BLOOD LEFT ANTECUBITAL  Final   Special Requests   Final    BOTTLES DRAWN AEROBIC AND ANAEROBIC Blood Culture adequate volume   Culture   Final    NO GROWTH 3 DAYS Performed at [facility], [address] [place],  [address]    Report Status PENDING  Incomplete  CULTURE, BLOOD (ROUTINE X 2) w Reflex to ID Panel     Status: None (Preliminary result)   Collection Time: 08/08/2020  7:08 PM   Specimen: BLOOD  Result Value Ref Range Status   Specimen Description BLOOD BLOOD LEFT HAND  Final   Special Requests   Final    BOTTLES DRAWN AEROBIC AND ANAEROBIC Blood Culture adequate volume   Culture   Final    NO GROWTH 3 DAYS Performed at [facility], [address] [place],  [address]    Report Status PENDING  Incomplete    Lab Basic Metabolic Panel: Recent Labs  Lab 08/15/2020 1520 08/19/2020 1838 08/18/20 0336 08/19/20 0338 2020/09/18 0452  NA 139 140 141  141 135 132*  K 3.2* 3.2* 3.5  3.5 2.9* 4.1  CL 94* 96* 98  98 95* 91*  CO2 27 31 31  30 27 23   GLUCOSE 136* 130* 118*  115* 322* 104*  BUN 16 18 21  22  28* 35*  CREATININE 3.74* 3.78* 4.33*  4.42* 5.31* 6.14*  CALCIUM 8.1* 8.0* 8.4*  8.3* 8.1* 7.8*  MG 2.1  --  1.8 2.1 1.9  PHOS  --   --  5.7* 5.9* 7.4*   Liver Function Tests: Recent Labs  Lab 08/15/20 0454 08/16/20 0425 08/29/2020 1520 08/18/20 0336  AST 235* 138* 97* 82*  ALT 291* 217*  153* 133*  ALKPHOS 135* 133* 142* 126  BILITOT 1.4* 1.5* 1.5* 1.8*  PROT 7.0 6.9 7.2 6.6  ALBUMIN 2.9* 2.8* 2.8* 2.6*   No results for input(s): LIPASE, AMYLASE in the last 168 hours. No results  for input(s): AMMONIA in the last 168 hours. CBC: Recent Labs  Lab 08/16/20 0425 09/03/2020 1520 08/18/20 0336 08/19/20 0338 Sep 18, 2020 0452  WBC 5.6 12.2* 8.5 6.8 3.1*  NEUTROABS  --  4.6  --   --   --   HGB 10.9* 10.8* 10.6* 11.0* 11.4*  HCT 35.2* 37.4* 36.0* 37.6* 37.0*  MCV 101.7* 106.3* 104.7* 105.0* 101.1*  PLT 79* 92* 96* 82* 90*   Cardiac Enzymes: No results for input(s): CKTOTAL, CKMB, CKMBINDEX, TROPONINI in the last 168 hours. Sepsis Labs: Recent Labs  Lab 08/16/2020 1520 08/28/2020 1838 08/18/20 0336 08/19/20 0338 09-18-20 0452  PROCALCITON  --  5.64 8.23 13.21  --   WBC 12.2*  --  8.5 6.8 3.1*  LATICACIDVEN 8.4* 2.5*  --   --   --     Procedures/Operations  08/30/2020: Endotracheal intubation 08/31/2020: Right Femoral Trialysis catheter placement 08/16/2020: Right femoral Arterial line placement       Darel Hong, AGACNP-BC [facility] Prefer epic messenger for cross cover needs If after hours, please call E-link  Bradly Bienenstock 18-Sep-2020, 1:36 PM

## 2020-09-05 NOTE — Progress Notes (Signed)
Patient has persistent lower lip quiver/questionable seizure activity. Ativan 4mg  given IV, with minimal improvement. [facility] NP notified.  Orders received.

## 2020-09-05 NOTE — Progress Notes (Signed)
Compassionate extubation completed per MD order

## 2020-09-05 NOTE — Progress Notes (Incomplete)
PHARMACY CONSULT NOTE - FOLLOW UP  Pharmacy Consult for Electrolyte Monitoring and Replacement   Recent Labs: Potassium (mmol/L)  Date Value  2020/08/31 4.1   Magnesium (mg/dL)  Date Value  08/31/2020 1.9   Calcium (mg/dL)  Date Value  2020-08-31 7.8 (L)   Calcium, Total (PTH) (mg/dL)  Date Value  12/14/2015 9.2   Albumin (g/dL)  Date Value  08/18/2020 2.6 (L)  04/26/2019 4.3   Phosphorus (mg/dL)  Date Value  08/31/20 7.4 (H)   Sodium  Date Value  08/31/2020 132 mmol/L (L)  04/14/2019 143     Assessment: ***  Goal of Therapy:  ***  Plan:  ***  Dallie Piles ,PharmD Clinical Pharmacist 08-31-2020 7:04 AM

## 2020-09-05 NOTE — Chronic Care Management (AMB) (Signed)
  Care Management   Follow Up Note   09/12/20 Name: Jeremy [name] MRN: [id number] DOB: 03-May-1955   Referred by: Leone Haven, MD Reason for referral : Case Closure and Chronic Care Management   Patient was referred for care management engagement. It is noted in the EHR that the patient is deceased as of 09-12-2020 (date). The primary care provider has been notified of this information. No further outreach on the part of the embedded care management team will be made.   Follow Up Plan: No further follow up required: as patient is expired.  Hubert Azure RN, MSN RN Care Management Coordinator [place] [phone] [email or web address]

## 2020-09-05 NOTE — Progress Notes (Signed)
Progress Note  Patient Name: Jeremy [name] Date of Encounter: Aug 29, 2020  [facility] Cardiologist: Kathlyn Sacramento, MD  Subjective   Not responsive.  Involuntary movements of the right lib worrisome for myoclonus or seizure activity.  It appears that the patient has significant anoxic encephalopathy based on clinical progression as well as MRI findings.  Family is contemplating comfort care.  Inpatient Medications    Scheduled Meds: . chlorhexidine gluconate (MEDLINE KIT)  15 mL Mouth Rinse BID  . Chlorhexidine Gluconate Cloth  6 each Topical Daily  . docusate  100 mg Per Tube BID  . insulin aspart  0-6 Units Subcutaneous Q4H  . mouth rinse  15 mL Mouth Rinse 10 times per day  . pantoprazole (PROTONIX) IV  40 mg Intravenous QHS  . valproic acid  250 mg Per Tube QID   Continuous Infusions: . sodium chloride Stopped (08/18/20 2117)  . dextrose 100 mL/hr at 08/29/2020 0600  . fentaNYL infusion INTRAVENOUS Stopped (08/19/20 1330)  . levETIRAcetam 1,000 mg (08/29/2020 0816)  . levETIRAcetam Stopped (08/19/20 0839)  . norepinephrine (LEVOPHED) Adult infusion 25 mcg/min (08/29/2020 0600)  . vasopressin 0.04 Units/min (Aug 29, 2020 0739)   PRN Meds: dextrose, fentaNYL, levETIRAcetam, LORazepam   Vital Signs    Vitals:   29-Aug-2020 0600 29-Aug-2020 0700 2020-08-29 0737 08-29-20 0800  BP:  112/78  (!) 110/50  Pulse:  82  88  Resp:  (!) 26  (!) 26  Temp: 98.4 F (36.9 C)     TempSrc: Rectal     SpO2:  93% 91% 91%  Weight:      Height:        Intake/Output Summary (Last 24 hours) at Aug 29, 2020 0914 Last data filed at 2020-08-29 0600 Gross per 24 hour  Intake 2550.76 ml  Output --  Net 2550.76 ml   Last 3 Weights 08/25/2020 08/16/2020 08/16/2020  Weight (lbs) 156 lb 12 oz 156 lb 8.4 oz 156 lb 9.6 oz  Weight (kg) 71.1 kg 71 kg 71.033 kg      Telemetry    Sinus rhythm with PACs and PVCs.- Personally Reviewed  ECG     - Personally Reviewed  Physical Exam   GEN:  Intubated and  nonresponsive Neck: No JVD Cardiac: RRR, no murmurs, rubs, or gallops.  Respiratory: Clear to auscultation bilaterally. GI: Soft, nontender, non-distended  MS: No edema; No deformity. Neuro:   Movement of the right lip Psych: Not responsive. Right BKA  Labs    High Sensitivity Troponin:   Recent Labs  Lab 08/07/2020 1838 08/07/2020 2018 08/28/2020 2224 08/18/20 0336 08/18/20 0519  TROPONINIHS 615* 824* 961* 1,084* 1,082*      Chemistry Recent Labs  Lab 08/16/20 0425 08/14/2020 1520 09/04/2020 1838 08/18/20 0336 08/19/20 0338 08/29/2020 0452  NA 136 139   < > 141  141 135 132*  K 4.1 3.2*   < > 3.5  3.5 2.9* 4.1  CL 95* 94*   < > 98  98 95* 91*  CO2 25 27   < > '31  30 27 23  ' GLUCOSE 94 136*   < > 118*  115* 322* 104*  BUN 30* 16   < > 21  22 28* 35*  CREATININE 5.28* 3.74*   < > 4.33*  4.42* 5.31* 6.14*  CALCIUM 8.6* 8.1*   < > 8.4*  8.3* 8.1* 7.8*  PROT 6.9 7.2  --  6.6  --   --   ALBUMIN 2.8* 2.8*  --  2.6*  --   --  AST 138* 97*  --  82*  --   --   ALT 217* 153*  --  133*  --   --   ALKPHOS 133* 142*  --  126  --   --   BILITOT 1.5* 1.5*  --  1.8*  --   --   GFRNONAA 11* 17*   < > 15*  14* 11* 10*  ANIONGAP 16* 18*   < > '12  13 13 ' 18*   < > = values in this interval not displayed.     Hematology Recent Labs  Lab 08/18/20 0336 08/19/20 0338 09/08/20 0452  WBC 8.5 6.8 3.1*  RBC 3.44* 3.58* 3.66*  HGB 10.6* 11.0* 11.4*  HCT 36.0* 37.6* 37.0*  MCV 104.7* 105.0* 101.1*  MCH 30.8 30.7 31.1  MCHC 29.4* 29.3* 30.8  RDW 17.3* 17.3* 17.1*  PLT 96* 82* 90*    BNPNo results for input(s): BNP, PROBNP in the last 168 hours.   DDimer No results for input(s): DDIMER in the last 168 hours.   Radiology    DG Abd 1 View  Result Date: 08/19/2020 CLINICAL DATA:  Status post OG tube placement EXAM: ABDOMEN - 1 VIEW COMPARISON:  08/18/2020 FINDINGS: The enteric tube tip projects over the body of stomach. Side port is below the level of the GE junction. No dilated  bowel loops identified. Calcified renal graft and endovascular coils are noted within the right lower quadrant of the abdomen. Aortic atherosclerosis. IMPRESSION: Satisfactory position of enteric tube with tip in the body of stomach. Electronically Signed   By: Kerby Moors M.D.   On: 08/19/2020 08:05   MR BRAIN WO CONTRAST  Result Date: 08/19/2020 CLINICAL DATA:  [place] and brain injury EXAM: MRI HEAD WITHOUT CONTRAST TECHNIQUE: Multiplanar, multiecho pulse sequences of the brain and surrounding structures were obtained without intravenous contrast. COMPARISON:  Head CT from 2 days ago FINDINGS: Brain: Accentuated cortical diffusion signal in the bilateral occipital and perisylvian regions and in the bilateral deep gray nuclei. Findings compatible with history of anoxic brain injury. The findings are more symmetric and discontinuous than seen with global seizure. Small remote left cerebellar infarct. No hemorrhage, hydrocephalus, or collection. T1 hyperintensity in the globus pallidus, likely metabolic. Few remote microhemorrhages in the deep brain, usually hypertensive the white matter disease is mild and there is also a history of mechanical valve. Small area of right parietal superficial siderosis. Vascular: Preserved flow voids Skull and upper cervical spine: Mildly hypointense marrow likely related and end-stage renal disease. Sinuses/Orbits: Partial right mastoid opacification. Bilateral cataract resection. IMPRESSION: 1. Cortical and basal ganglia restricted diffusion that correlates with history of global anoxic injury. 2. No signs of elevated intracranial pressure. Electronically Signed   By: Monte Fantasia M.D.   On: 08/19/2020 11:42   DG Chest Port 1 View  Result Date: 08/19/2020 CLINICAL DATA:  Status post intubation.  Evaluate OG tube placement. EXAM: PORTABLE CHEST 1 VIEW COMPARISON:  08/18/2020 FINDINGS: ET tube tip is in satisfactory position above the carina. The nasogastric tube and  side port are below the level of the GE junction. Previous median sternotomy and CABG procedure. Stable cardiac enlargement. Mild diffuse edema. New opacity is identified within the right lung base which may represent atelectasis or pneumonia. IMPRESSION: Satisfactory position of ET tube and nasogastric tube. New opacity within the medial right lung base which may represent atelectasis or pneumonia. Electronically Signed   By: Kerby Moors M.D.   On: 08/19/2020 08:04  Cardiac Studies   Echocardiogram done on 09/07/20 during the most recent admission showed an EF of 25 to 30% with replaced aortic valve.  Patient Profile     65 y.o. male with past medical history of coronary artery disease status post CABG and subsequent PCI with drug-eluting stent to the SVG to right PDA, chronic systolic heart failure, persistent atrial fibrillation, aortic stenosis status post mechanical aortic valve replacement, end-stage renal disease on hemodialysis and previous failed transplant, PAD status post right BKA, anemia of chronic disease, essential hypertension, hyperlipidemia and recent hospitalization for wide-complex tachycardia in the setting of volume overload and hyperkalemia complicated by persistent atrial fibrillation requiring cardioversion. He presented 1 day after recent discharge with PEA arrest requiring 20 minutes of CPR.  Assessment & Plan    1.  PEA cardiac arrest with at least 20 minutes of CPR before ROSC: Exact etiology is not entirely clear.  There has been no evidence of arrhythmia.  Electrolyte abnormalities is a possibility.  In addition, global cardiac ischemia in the setting of severely reduced LV systolic function is a possibility. Unfortunately, he seems to have suffered significant anoxic encephalopathy.  At this point, it does not seem that he will have meaningful neurologic recovery and thus no plans for further cardiac work-up unless somehow his overall neurologic condition  improves.  2.  Coronary artery disease status post CABG: No evidence of myocardial infarction during this admission.  Troponin was mildly elevated likely due to supply demand ischemia.  No plans for left heart catheterization unless the patient improves neurologically which seems to be doubtful at this point.  3.  Status post mechanical aortic valve replacement: If the patient is maintained as none comfort care, recommend checking INR and if below 2, initiate anticoagulation with heparin.     For questions or updates, please contact [facility] Please consult [email or web address] for contact info under        Signed, Kathlyn Sacramento, MD  09-13-2020, 9:14 AM

## 2020-09-05 NOTE — Progress Notes (Signed)
1400 Per family and paperwork patients wishes were to donate his body to [facility]. Patient did not complete paperwork therefore [facility] woulf not accept his body. Family informed.

## 2020-09-05 DEATH — deceased

## 2020-09-20 ENCOUNTER — Ambulatory Visit: Payer: Medicare Other | Admitting: Cardiovascular Disease
